# Patient Record
Sex: Female | Born: 1965 | Race: White | Hispanic: No
Health system: Midwestern US, Community
[De-identification: ages and names within clinical notes are randomized; demographics above are authoritative.]

## PROBLEM LIST (undated history)

## (undated) DIAGNOSIS — K529 Noninfective gastroenteritis and colitis, unspecified: Secondary | ICD-10-CM

## (undated) DIAGNOSIS — E559 Vitamin D deficiency, unspecified: Secondary | ICD-10-CM

## (undated) DIAGNOSIS — R87619 Unspecified abnormal cytological findings in specimens from cervix uteri: Secondary | ICD-10-CM

## (undated) DIAGNOSIS — K649 Unspecified hemorrhoids: Secondary | ICD-10-CM

## (undated) DIAGNOSIS — K922 Gastrointestinal hemorrhage, unspecified: Secondary | ICD-10-CM

## (undated) DIAGNOSIS — K921 Melena: Secondary | ICD-10-CM

## (undated) DIAGNOSIS — Z9221 Personal history of antineoplastic chemotherapy: Secondary | ICD-10-CM

## (undated) DIAGNOSIS — M1990 Unspecified osteoarthritis, unspecified site: Secondary | ICD-10-CM

## (undated) DIAGNOSIS — C539 Malignant neoplasm of cervix uteri, unspecified: Secondary | ICD-10-CM

## (undated) DIAGNOSIS — M436 Torticollis: Secondary | ICD-10-CM

## (undated) DIAGNOSIS — K9189 Other postprocedural complications and disorders of digestive system: Secondary | ICD-10-CM

## (undated) DIAGNOSIS — R112 Nausea with vomiting, unspecified: Secondary | ICD-10-CM

## (undated) DIAGNOSIS — D519 Vitamin B12 deficiency anemia, unspecified: Secondary | ICD-10-CM

## (undated) DIAGNOSIS — K5100 Ulcerative (chronic) pancolitis without complications: Secondary | ICD-10-CM

## (undated) DIAGNOSIS — D6851 Activated protein C resistance: Secondary | ICD-10-CM

## (undated) DIAGNOSIS — R0600 Dyspnea, unspecified: Secondary | ICD-10-CM

## (undated) DIAGNOSIS — K739 Chronic hepatitis, unspecified: Secondary | ICD-10-CM

## (undated) DIAGNOSIS — B1920 Unspecified viral hepatitis C without hepatic coma: Secondary | ICD-10-CM

## (undated) DIAGNOSIS — K259 Gastric ulcer, unspecified as acute or chronic, without hemorrhage or perforation: Secondary | ICD-10-CM

## (undated) DIAGNOSIS — R109 Unspecified abdominal pain: Secondary | ICD-10-CM

## (undated) DIAGNOSIS — I700 Atherosclerosis of aorta: Secondary | ICD-10-CM

## (undated) DIAGNOSIS — K625 Hemorrhage of anus and rectum: Secondary | ICD-10-CM

## (undated) DIAGNOSIS — F419 Anxiety disorder, unspecified: Secondary | ICD-10-CM

## (undated) DIAGNOSIS — A0472 Enterocolitis due to Clostridium difficile, not specified as recurrent: Secondary | ICD-10-CM

## (undated) DIAGNOSIS — G893 Neoplasm related pain (acute) (chronic): Secondary | ICD-10-CM

## (undated) DIAGNOSIS — Z8619 Personal history of other infectious and parasitic diseases: Secondary | ICD-10-CM

## (undated) DIAGNOSIS — J189 Pneumonia, unspecified organism: Secondary | ICD-10-CM

## (undated) DIAGNOSIS — R159 Full incontinence of feces: Secondary | ICD-10-CM

## (undated) DIAGNOSIS — C3490 Malignant neoplasm of unspecified part of unspecified bronchus or lung: Secondary | ICD-10-CM

## (undated) DIAGNOSIS — R918 Other nonspecific abnormal finding of lung field: Secondary | ICD-10-CM

## (undated) DIAGNOSIS — I669 Occlusion and stenosis of unspecified cerebral artery: Secondary | ICD-10-CM

## (undated) DIAGNOSIS — R197 Diarrhea, unspecified: Secondary | ICD-10-CM

## (undated) DIAGNOSIS — N280 Ischemia and infarction of kidney: Secondary | ICD-10-CM

## (undated) DIAGNOSIS — K219 Gastro-esophageal reflux disease without esophagitis: Secondary | ICD-10-CM

## (undated) DIAGNOSIS — D518 Other vitamin B12 deficiency anemias: Secondary | ICD-10-CM

## (undated) DIAGNOSIS — K601 Chronic anal fissure: Secondary | ICD-10-CM

## (undated) DIAGNOSIS — R011 Cardiac murmur, unspecified: Secondary | ICD-10-CM

## (undated) DIAGNOSIS — K3189 Other diseases of stomach and duodenum: Secondary | ICD-10-CM

## (undated) DIAGNOSIS — K56609 Unspecified intestinal obstruction, unspecified as to partial versus complete obstruction: Secondary | ICD-10-CM

## (undated) DIAGNOSIS — A630 Anogenital (venereal) warts: Secondary | ICD-10-CM

## (undated) DIAGNOSIS — Z5189 Encounter for other specified aftercare: Secondary | ICD-10-CM

## (undated) HISTORY — DX: Unspecified abnormal cytological findings in specimens from cervix uteri: R87.619

## (undated) HISTORY — DX: Occlusion and stenosis of unspecified cerebral artery: I66.9

## (undated) HISTORY — DX: Diarrhea, unspecified: R19.7

## (undated) HISTORY — DX: Enterocolitis due to Clostridium difficile, not specified as recurrent: A04.72

## (undated) HISTORY — DX: Encounter for other specified aftercare: Z51.89

## (undated) HISTORY — PX: DIAGNOSTIC LAPAROSCOPY: SUR761

## (undated) HISTORY — DX: Gastric ulcer, unspecified as acute or chronic, without hemorrhage or perforation: K25.9

## (undated) HISTORY — DX: Melena: K92.1

## (undated) HISTORY — DX: Unspecified abdominal pain: R10.9

## (undated) HISTORY — PX: SMALL INTESTINE SURGERY: SHX150

## (undated) HISTORY — PX: TANDEM RING INSERTION: SHX6199

## (undated) HISTORY — DX: Ulcerative (chronic) pancolitis without complications: K51.00

## (undated) HISTORY — DX: Other diseases of stomach and duodenum: K31.89

## (undated) HISTORY — DX: Cardiac murmur, unspecified: R01.1

## (undated) HISTORY — PX: THORACOTOMY: SUR1349

## (undated) HISTORY — DX: Ischemia and infarction of kidney: N28.0

## (undated) HISTORY — PX: CHOLECYSTECTOMY: SHX55

## (undated) HISTORY — DX: Nausea with vomiting, unspecified: R11.2

## (undated) HISTORY — DX: Neoplasm related pain (acute) (chronic): G89.3

## (undated) HISTORY — DX: Unspecified osteoarthritis, unspecified site: M19.90

## (undated) HISTORY — DX: Chronic hepatitis, unspecified: K73.9

## (undated) HISTORY — DX: Other postprocedural complications and disorders of digestive system: K91.89

## (undated) HISTORY — DX: Hemorrhage of anus and rectum: K62.5

## (undated) HISTORY — PX: OOPHORECTOMY: SHX86

---
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
## 2013-11-11 ENCOUNTER — Emergency Department: Payer: Self-pay | Admitting: Emergency Medicine

## 2016-05-29 NOTE — Nursing Note (Signed)
Nursing Discharge Summary - Text       Nursing Discharge Summary Entered On:  05/29/2016 16:36 EDT    Performed On:  05/29/2016 16:36 EDT by Leanna BattlesMALONE, RN, JENNIFER L               DC Information   Discharge To, Anticipated :   Home independently   Mode of Discharge :   Ambulatory   Transportation :   Private vehicle   Accompanied By :   Casimer BilisFriend   MALONE, RN, JENNIFER L - 05/29/2016 16:36 EDT   Education   Responsible Learner(s) :   No Data Available     Barriers To Learning :   None evident   Teaching Method :   Explanation, Printed materials   Education Referral Made To :   Physician Specialist   Leanna BattlesMALONE, RN, JENNIFER L - 05/29/2016 16:36 EDT

## 2016-09-13 DIAGNOSIS — N83519 Torsion of ovary and ovarian pedicle, unspecified side: Secondary | ICD-10-CM | POA: Insufficient documentation

## 2016-09-15 DIAGNOSIS — C539 Malignant neoplasm of cervix uteri, unspecified: Secondary | ICD-10-CM

## 2016-09-15 HISTORY — DX: Malignant neoplasm of cervix uteri, unspecified: C53.9

## 2016-09-18 DIAGNOSIS — C539 Malignant neoplasm of cervix uteri, unspecified: Secondary | ICD-10-CM | POA: Insufficient documentation

## 2016-09-25 DIAGNOSIS — C530 Malignant neoplasm of endocervix: Secondary | ICD-10-CM | POA: Insufficient documentation

## 2017-01-29 DIAGNOSIS — T451X5A Adverse effect of antineoplastic and immunosuppressive drugs, initial encounter: Secondary | ICD-10-CM

## 2017-01-29 DIAGNOSIS — D701 Agranulocytosis secondary to cancer chemotherapy: Secondary | ICD-10-CM | POA: Insufficient documentation

## 2017-03-01 DIAGNOSIS — D638 Anemia in other chronic diseases classified elsewhere: Secondary | ICD-10-CM | POA: Insufficient documentation

## 2017-03-15 DIAGNOSIS — I10 Essential (primary) hypertension: Secondary | ICD-10-CM | POA: Insufficient documentation

## 2017-05-05 DIAGNOSIS — K739 Chronic hepatitis, unspecified: Secondary | ICD-10-CM

## 2017-05-05 HISTORY — DX: Chronic hepatitis, unspecified: K73.9

## 2017-05-12 DIAGNOSIS — C539 Malignant neoplasm of cervix uteri, unspecified: Secondary | ICD-10-CM | POA: Insufficient documentation

## 2017-05-12 DIAGNOSIS — C799 Secondary malignant neoplasm of unspecified site: Secondary | ICD-10-CM | POA: Insufficient documentation

## 2017-06-20 DIAGNOSIS — R300 Dysuria: Secondary | ICD-10-CM | POA: Insufficient documentation

## 2017-07-18 DIAGNOSIS — M47812 Spondylosis without myelopathy or radiculopathy, cervical region: Secondary | ICD-10-CM | POA: Insufficient documentation

## 2017-08-16 DIAGNOSIS — K56609 Unspecified intestinal obstruction, unspecified as to partial versus complete obstruction: Secondary | ICD-10-CM

## 2017-08-16 HISTORY — DX: Unspecified intestinal obstruction, unspecified as to partial versus complete obstruction: K56.609

## 2017-08-16 HISTORY — PX: COLON SURGERY: SHX602

## 2017-08-26 ENCOUNTER — Inpatient Hospital Stay
Admission: EM | Admit: 2017-08-26 | Discharge: 2017-09-05 | DRG: 330 | Disposition: A | Discharge status: Home / Self Care | Payer: Medicaid Other | Attending: Surgery | Admitting: Surgery

## 2017-08-26 ENCOUNTER — Encounter: Payer: Self-pay | Admitting: Emergency Medicine

## 2017-08-26 ENCOUNTER — Emergency Department: Payer: Medicaid Other

## 2017-08-26 ENCOUNTER — Other Ambulatory Visit: Payer: Self-pay

## 2017-08-26 DIAGNOSIS — Z978 Presence of other specified devices: Secondary | ICD-10-CM

## 2017-08-26 DIAGNOSIS — Z79899 Other long term (current) drug therapy: Secondary | ICD-10-CM

## 2017-08-26 DIAGNOSIS — E871 Hypo-osmolality and hyponatremia: Secondary | ICD-10-CM | POA: Diagnosis present

## 2017-08-26 DIAGNOSIS — F419 Anxiety disorder, unspecified: Secondary | ICD-10-CM | POA: Diagnosis present

## 2017-08-26 DIAGNOSIS — K567 Ileus, unspecified: Secondary | ICD-10-CM | POA: Diagnosis present

## 2017-08-26 DIAGNOSIS — E860 Dehydration: Secondary | ICD-10-CM | POA: Diagnosis present

## 2017-08-26 DIAGNOSIS — K56609 Unspecified intestinal obstruction, unspecified as to partial versus complete obstruction: Principal | ICD-10-CM

## 2017-08-26 DIAGNOSIS — Z9221 Personal history of antineoplastic chemotherapy: Secondary | ICD-10-CM

## 2017-08-26 DIAGNOSIS — I10 Essential (primary) hypertension: Secondary | ICD-10-CM | POA: Diagnosis present

## 2017-08-26 DIAGNOSIS — Z923 Personal history of irradiation: Secondary | ICD-10-CM

## 2017-08-26 DIAGNOSIS — N736 Female pelvic peritoneal adhesions (postinfective): Secondary | ICD-10-CM | POA: Diagnosis present

## 2017-08-26 DIAGNOSIS — R109 Unspecified abdominal pain: Secondary | ICD-10-CM

## 2017-08-26 DIAGNOSIS — Z8541 Personal history of malignant neoplasm of cervix uteri: Secondary | ICD-10-CM

## 2017-08-26 DIAGNOSIS — R918 Other nonspecific abnormal finding of lung field: Secondary | ICD-10-CM | POA: Diagnosis present

## 2017-08-26 DIAGNOSIS — Z888 Allergy status to other drugs, medicaments and biological substances status: Secondary | ICD-10-CM

## 2017-08-26 HISTORY — DX: Anxiety disorder, unspecified: F41.9

## 2017-08-26 LAB — CBC
HCT: 39.4 % (ref 35.0–47.0)
HEMOGLOBIN: 13.5 g/dL (ref 12.0–16.0)
MCH: 33.2 pg (ref 26.0–34.0)
MCHC: 34.4 g/dL (ref 32.0–36.0)
MCV: 96.5 fL (ref 80.0–100.0)
Platelets: 278 10*3/uL (ref 150–440)
RBC: 4.08 MIL/uL (ref 3.80–5.20)
RDW: 13.6 % (ref 11.5–14.5)
WBC: 7.5 10*3/uL (ref 3.6–11.0)

## 2017-08-26 LAB — COMPREHENSIVE METABOLIC PANEL
ALT: 39 U/L (ref 14–54)
ANION GAP: 12 (ref 5–15)
AST: 64 U/L — AB (ref 15–41)
Albumin: 4 g/dL (ref 3.5–5.0)
Alkaline Phosphatase: 186 U/L — ABNORMAL HIGH (ref 38–126)
BILIRUBIN TOTAL: 0.8 mg/dL (ref 0.3–1.2)
BUN: 25 mg/dL — AB (ref 6–20)
CALCIUM: 9.5 mg/dL (ref 8.9–10.3)
CO2: 27 mmol/L (ref 22–32)
Chloride: 95 mmol/L — ABNORMAL LOW (ref 101–111)
Creatinine, Ser: 1.35 mg/dL — ABNORMAL HIGH (ref 0.44–1.00)
GFR, EST AFRICAN AMERICAN: 52 mL/min — AB (ref >60)
GFR, EST NON AFRICAN AMERICAN: 45 mL/min — AB (ref >60)
GLUCOSE: 107 mg/dL — AB (ref 65–99)
POTASSIUM: 3.6 mmol/L (ref 3.5–5.1)
Sodium: 134 mmol/L — ABNORMAL LOW (ref 135–145)
TOTAL PROTEIN: 8.8 g/dL — AB (ref 6.5–8.1)

## 2017-08-26 LAB — LIPASE, BLOOD: Lipase: 19 U/L (ref 11–51)

## 2017-08-26 MED ORDER — MORPHINE SULFATE (PF) 2 MG/ML IV SOLN
2.0000 mg | Freq: Once | INTRAVENOUS | Status: AC
  Administered 2017-08-26: 2 mg via INTRAVENOUS
  Filled 2017-08-26: qty 1

## 2017-08-26 MED ORDER — MORPHINE SULFATE (PF) 4 MG/ML IV SOLN
4.0000 mg | Freq: Once | INTRAVENOUS | Status: AC
  Administered 2017-08-26: 4 mg via INTRAVENOUS

## 2017-08-26 MED ORDER — PROMETHAZINE HCL 25 MG/ML IJ SOLN
INTRAMUSCULAR | Status: AC
  Administered 2017-08-27: 12.5 mg via INTRAVENOUS
  Filled 2017-08-26: qty 1

## 2017-08-26 MED ORDER — MORPHINE SULFATE (PF) 4 MG/ML IV SOLN
4.0000 mg | Freq: Once | INTRAVENOUS | Status: AC
  Administered 2017-08-26 – 2017-08-27 (×2): 4 mg via INTRAVENOUS
  Filled 2017-08-26: qty 1

## 2017-08-26 MED ORDER — PROMETHAZINE HCL 25 MG/ML IJ SOLN
12.5000 mg | Freq: Once | INTRAMUSCULAR | Status: AC
  Administered 2017-08-26 – 2017-08-27 (×2): 12.5 mg via INTRAVENOUS
  Filled 2017-08-26: qty 1

## 2017-08-26 MED ORDER — MORPHINE SULFATE (PF) 4 MG/ML IV SOLN
INTRAVENOUS | Status: AC
  Administered 2017-08-27: 4 mg via INTRAVENOUS
  Filled 2017-08-26: qty 1

## 2017-08-26 MED ORDER — IOPAMIDOL (ISOVUE-300) INJECTION 61%
75.0000 mL | Freq: Once | INTRAVENOUS | Status: AC | PRN
  Administered 2017-08-26: 75 mL via INTRAVENOUS

## 2017-08-26 MED ORDER — PROMETHAZINE HCL 25 MG/ML IJ SOLN
12.5000 mg | Freq: Once | INTRAMUSCULAR | Status: AC
  Administered 2017-08-26: 12.5 mg via INTRAVENOUS

## 2017-08-26 MED ORDER — ONDANSETRON HCL 4 MG/2ML IJ SOLN
4.0000 mg | Freq: Once | INTRAMUSCULAR | Status: AC | PRN
  Administered 2017-08-26: 4 mg via INTRAVENOUS
  Filled 2017-08-26: qty 2

## 2017-08-26 MED ORDER — SODIUM CHLORIDE 0.9 % IV BOLUS (SEPSIS)
1000.0000 mL | Freq: Once | INTRAVENOUS | Status: AC
  Administered 2017-08-26: 1000 mL via INTRAVENOUS

## 2017-08-26 MED ORDER — IOPAMIDOL (ISOVUE-300) INJECTION 61%
30.0000 mL | Freq: Once | INTRAVENOUS | Status: AC | PRN
Start: 2017-08-26 — End: 2017-08-26
  Administered 2017-08-26: 30 mL via ORAL

## 2017-08-26 NOTE — ED Triage Notes (Signed)
Pt has hx of cancer which she just completed treatments. Pt was at [facility] in [place] x 13 until Wednesday with small bowl obstruction that was linked to her her radiation treatments per her MD. Pt has been vomiting since Friday without relief. Pt is vomiting in triage with visible paleness and complains of dizziness and lightheadedness.

## 2017-08-26 NOTE — ED Provider Notes (Signed)
[facility] Emergency Department Provider Note  ____________________________________________   First MD Initiated Contact with Patient 08/26/17 1941     (approximate)  I have reviewed the triage vital signs and the nursing notes.   HISTORY  Chief Complaint Emesis   HPI Joanna Hall is a 51 y.o. female here for evaluation of vomiting  Patient reports that she has cervical cancer recently, she was treated with chemotherapy and radiation at [facility]  She was recently discharged from the hospital on Wednesday with a 13-day hospital course for a bowel obstruction.  She then traveled here to stay with her sister, yesterday she began experiencing nausea and upper abdominal pain.  Today she began having frequent vomiting and increasing abdominal pain.  She does report that she had one bowel movement this morning that was slightly loose and she is continued to pass some gas but reports that she is having the pain and same symptoms that she had when she had a bowel obstruction that was treated without surgery last week in [place].  No numbness or tingling.  No chest pain or trouble breathing.  She has had her gallbladder previously removed and had both ovaries removed.    Past Medical History:  Diagnosis Date  . Anxiety   . Cancer ([place])   . Hypertension     There are no active problems to display for this patient.   History reviewed. No pertinent surgical history.  Prior to Admission medications   Not on File    Allergies Ketamine  No family history on file.  Social History Social History   Tobacco Use  . Smoking status: Never Smoker  . Smokeless tobacco: Never Used  Substance Use Topics  . Alcohol use: No    Frequency: Never  . Drug use: No    Review of Systems Constitutional: No fever/chills Eyes: No visual changes. ENT: No sore throat. Cardiovascular: Denies chest pain. Respiratory: Denies shortness of breath. Gastrointestinal:  See HPI Genitourinary: Negative for dysuria. Musculoskeletal: Negative for back pain. Skin: Negative for rash. Neurological: Negative for headaches, focal weakness or numbness.    ____________________________________________   PHYSICAL EXAM:  VITAL SIGNS: ED Triage Vitals  Enc Vitals Group     BP 08/26/17 1920 132/83     Pulse Rate 08/26/17 1920 (!) 105     Resp 08/26/17 1920 20     Temp 08/26/17 1920 98 F (36.7 C)     Temp Source 08/26/17 1920 Oral     SpO2 08/26/17 1920 98 %     Weight 08/26/17 1922 157 lb (71.2 kg)     Height 08/26/17 1922 5' 9.5" (1.765 m)     Head Circumference --      Peak Flow --      Pain Score 08/26/17 1920 10     Pain Loc --      Pain Edu? --      Excl. in [place]? --     Constitutional: Alert and oriented. Well appearing and in no acute distress. Eyes: Conjunctivae are normal. Head: Atraumatic. Nose: No congestion/rhinnorhea. Mouth/Throat: Mucous membranes are moist. Neck: No stridor.   Cardiovascular: Normal rate, regular rhythm. Grossly normal heart sounds.  Good peripheral circulation. Respiratory: Normal respiratory effort.  No retractions. Lungs CTAB. Gastrointestinal: Soft and moderate tenderness in the left upper quadrant and epigastric region.  No rebound guarding or peritonitis.  Bowel sounds are present.   Musculoskeletal: No lower extremity tenderness nor edema. Neurologic:  Normal speech and language.  No gross focal neurologic deficits are appreciated.  Skin:  Skin is warm, dry and intact. No rash noted. Psychiatric: Mood and affect are normal. Speech and behavior are normal.  ____________________________________________   LABS (all labs ordered are listed, but only abnormal results are displayed)  Labs Reviewed  COMPREHENSIVE METABOLIC PANEL - Abnormal; Notable for the following components:      Result Value   Sodium 134 (*)    Chloride 95 (*)    Glucose, Bld 107 (*)    BUN 25 (*)    Creatinine, Ser 1.35 (*)    Total  Protein 8.8 (*)    AST 64 (*)    Alkaline Phosphatase 186 (*)    GFR calc non Af Amer 45 (*)    GFR calc Af Amer 52 (*)    All other components within normal limits  LIPASE, BLOOD  CBC  URINALYSIS, COMPLETE (UACMP) WITH MICROSCOPIC  CBG MONITORING, ED   ____________________________________________  EKG   ____________________________________________  RADIOLOGY  Ct Abdomen Pelvis W Contrast  Result Date: 08/26/2017 CLINICAL DATA:  [name]; history of cervical cancer status post chemotherapy and radiation. Recent small bowel obstruction. Presenting with vomiting. EXAM: CT ABDOMEN AND PELVIS WITH CONTRAST TECHNIQUE: Multidetector CT imaging of the abdomen and pelvis was performed using the standard protocol following bolus administration of intravenous contrast. CONTRAST:  64m ISOVUE-300 IOPAMIDOL (ISOVUE-300) INJECTION 61% COMPARISON:  None. FINDINGS: Lower chest: Numerous pulmonary nodules in the lung bases measuring up to 9 mm on the left, likely metastatic disease (series 4, image 12). Hepatobiliary: No focal liver lesion identified. Status post cholecystectomy. Mild intra and extrahepatic biliary ductal dilatation the common bile duct measuring up to 13 mm which may be compensatory post cholecystectomy, clinical correlation recommended. Pancreas: Unremarkable. No pancreatic ductal dilatation or surrounding inflammatory changes. Spleen: Normal in size without focal abnormality. Adrenals/Urinary Tract: Adrenal glands are unremarkable. Punctate right kidney lower pole and left kidney interpolar nephrolithiasis. No hydronephrosis. Normal bladder. Stomach/Bowel: Small bowel obstruction with transition in the pelvis just superior to the uterus where there is a long segment of distal ileum which demonstrates fatty wall thickening compatible chronic inflammation and/or radiation enteritis. Colon is unremarkable. Normal appendix. There is thickening of the mesial rectal fascia which likely  represents sequelae of radiation. Vascular/Lymphatic: Aortic atherosclerosis. No enlarged abdominal or pelvic lymph nodes. Reproductive: Fluid-filled uterus which may reflect stenosis at the level of the cervix or postsurgical changes. No discrete mass identified. Other: Small volume of pelvic fluid, likely physiologic. Musculoskeletal: Mild lumbar spine levocurvature and moderate multilevel disc and facet degenerative changes. No acute osseous abnormality is evident. IMPRESSION: 1. Small bowel obstruction with transition in the pelvis just superior uterus at a long segment of ileum demonstrating chronic inflammatory/ radiation treatment changes. 2. Numerous pulmonary nodules, likely metastatic disease. 3. Enlarged common bile duct post cholecystectomy may be compensatory. No obstructing stone or mass identified. Clinical correlation recommended. 4. Bilateral kidney punctate nephrolithiasis. 5. Aortic atherosclerosis. 6. Fluid filled uterus may reflect cervical stenosis or postsurgical changes. No discrete mass identified. Electronically Signed   By: LKristine GarbeM.D.   On: 08/26/2017 23:17   Dg Abd Portable 1 View  Result Date: 08/26/2017 CLINICAL DATA:  [place] with small bowel obstruction thought to be due to radiation treatment for cancer. Patient has been vomiting over the past 3 days without relief. EXAM: PORTABLE ABDOMEN - 1 VIEW COMPARISON:  None. FINDINGS: Examination demonstrates multiple air-filled  dilated small bowel loops measuring up to 4.2 cm in diameter. There is air seen over the colon. No evidence of free peritoneal air. No mass mass effect. There are degenerative changes of the spine. Phleboliths over the pelvis. IMPRESSION: Findings compatible with a small bowel ileus versus early/partial mid to distal small bowel obstruction. Electronically Signed   By: Marin Olp M.D.   On: 08/26/2017 20:52    CT and x-ray results  reviewed.  CT notable for small bowel obstruction also numerous pulmonary nodules, concerning for metastatic disease ____________________________________________   PROCEDURES  Procedure(s) performed: None  Procedures  Critical Care performed: No  ____________________________________________   INITIAL IMPRESSION / ASSESSMENT AND PLAN / ED COURSE  Pertinent labs & imaging results that were available during my care of the patient were reviewed by me and considered in my medical decision making (see chart for details).  Differential diagnosis includes but is not limited to, abdominal perforation, aortic dissection, cholecystitis, appendicitis, diverticulitis, colitis, esophagitis/gastritis, kidney stone, pyelonephritis, urinary tract infection, aortic aneurysm. All are considered in decision and treatment plan. Based upon the patient's presentation and risk factors, I am concerned about strong suspicion for recurrent bowel obstruction or ileus.  Will proceed with CT scan, antiemetics, pain control.  No associated neurologic cardiac or respiratory symptoms.  Patient alert and oriented.     ----------------------------------------- 11:39 PM on 08/26/2017 -----------------------------------------  Patient reports pain is improving, but still present.  She has had ongoing vomiting in the ER despite antiemetics, likely because she has a bowel obstruction.  At this time, discussed with general surgery who advises placing an NG tube and that they will see the patient for admission.  Patient agreeable with plan for admission, understanding of reasoning and recurrence of small bowel obstruction.  ____________________________________________   FINAL CLINICAL IMPRESSION(S) / ED DIAGNOSES  Final diagnoses:  Small bowel obstruction ([place])  Pulmonary nodules      NEW MEDICATIONS STARTED DURING THIS VISIT:  This SmartLink is deprecated. Use AVSMEDLIST instead to display the medication list  for a patient.   Note:  This document was prepared using Dragon voice recognition software and may include unintentional dictation errors.     Delman Kitten, MD 08/26/17 [phone]

## 2017-08-27 ENCOUNTER — Other Ambulatory Visit: Payer: Self-pay

## 2017-08-27 ENCOUNTER — Inpatient Hospital Stay: Payer: Medicaid Other

## 2017-08-27 ENCOUNTER — Emergency Department: Payer: Medicaid Other

## 2017-08-27 DIAGNOSIS — E860 Dehydration: Secondary | ICD-10-CM | POA: Diagnosis present

## 2017-08-27 DIAGNOSIS — Z923 Personal history of irradiation: Secondary | ICD-10-CM | POA: Diagnosis not present

## 2017-08-27 DIAGNOSIS — Z8541 Personal history of malignant neoplasm of cervix uteri: Secondary | ICD-10-CM | POA: Diagnosis not present

## 2017-08-27 DIAGNOSIS — Z79899 Other long term (current) drug therapy: Secondary | ICD-10-CM | POA: Diagnosis not present

## 2017-08-27 DIAGNOSIS — I10 Essential (primary) hypertension: Secondary | ICD-10-CM | POA: Diagnosis present

## 2017-08-27 DIAGNOSIS — K56609 Unspecified intestinal obstruction, unspecified as to partial versus complete obstruction: Secondary | ICD-10-CM | POA: Diagnosis present

## 2017-08-27 DIAGNOSIS — R918 Other nonspecific abnormal finding of lung field: Secondary | ICD-10-CM | POA: Diagnosis present

## 2017-08-27 DIAGNOSIS — Z9221 Personal history of antineoplastic chemotherapy: Secondary | ICD-10-CM | POA: Diagnosis not present

## 2017-08-27 DIAGNOSIS — Z888 Allergy status to other drugs, medicaments and biological substances status: Secondary | ICD-10-CM | POA: Diagnosis not present

## 2017-08-27 DIAGNOSIS — F419 Anxiety disorder, unspecified: Secondary | ICD-10-CM | POA: Diagnosis present

## 2017-08-27 DIAGNOSIS — N736 Female pelvic peritoneal adhesions (postinfective): Secondary | ICD-10-CM | POA: Diagnosis present

## 2017-08-27 DIAGNOSIS — E871 Hypo-osmolality and hyponatremia: Secondary | ICD-10-CM | POA: Diagnosis present

## 2017-08-27 DIAGNOSIS — K567 Ileus, unspecified: Secondary | ICD-10-CM | POA: Diagnosis present

## 2017-08-27 LAB — COMPREHENSIVE METABOLIC PANEL
ALT: 54 U/L (ref 14–54)
ANION GAP: 10 (ref 5–15)
AST: 96 U/L — ABNORMAL HIGH (ref 15–41)
Albumin: 3.2 g/dL — ABNORMAL LOW (ref 3.5–5.0)
Alkaline Phosphatase: 226 U/L — ABNORMAL HIGH (ref 38–126)
BUN: 20 mg/dL (ref 6–20)
CHLORIDE: 96 mmol/L — AB (ref 101–111)
CO2: 26 mmol/L (ref 22–32)
CREATININE: 0.92 mg/dL (ref 0.44–1.00)
Calcium: 8.5 mg/dL — ABNORMAL LOW (ref 8.9–10.3)
Glucose, Bld: 94 mg/dL (ref 65–99)
POTASSIUM: 3.6 mmol/L (ref 3.5–5.1)
SODIUM: 132 mmol/L — AB (ref 135–145)
Total Bilirubin: 0.7 mg/dL (ref 0.3–1.2)
Total Protein: 7.2 g/dL (ref 6.5–8.1)

## 2017-08-27 LAB — URINALYSIS, COMPLETE (UACMP) WITH MICROSCOPIC
Bacteria, UA: NONE SEEN
Bilirubin Urine: NEGATIVE
GLUCOSE, UA: NEGATIVE mg/dL
Hgb urine dipstick: NEGATIVE
KETONES UR: NEGATIVE mg/dL
Nitrite: NEGATIVE
PH: 7 (ref 5.0–8.0)
Protein, ur: NEGATIVE mg/dL
Specific Gravity, Urine: >1.046 — ABNORMAL HIGH (ref 1.005–1.030)

## 2017-08-27 LAB — CBC WITH DIFFERENTIAL/PLATELET
BASOS ABS: 0 10*3/uL (ref 0–0.1)
BASOS PCT: 0 %
EOS ABS: 0.1 10*3/uL (ref 0–0.7)
Eosinophils Relative: 3 %
HCT: 31.7 % — ABNORMAL LOW (ref 35.0–47.0)
HEMOGLOBIN: 10.9 g/dL — AB (ref 12.0–16.0)
Lymphocytes Relative: 26 %
Lymphs Abs: 1.3 10*3/uL (ref 1.0–3.6)
MCH: 33.2 pg (ref 26.0–34.0)
MCHC: 34.4 g/dL (ref 32.0–36.0)
MCV: 96.7 fL (ref 80.0–100.0)
MONOS PCT: 8 %
Monocytes Absolute: 0.4 10*3/uL (ref 0.2–0.9)
NEUTROS PCT: 63 %
Neutro Abs: 3 10*3/uL (ref 1.4–6.5)
Platelets: 221 10*3/uL (ref 150–440)
RBC: 3.28 MIL/uL — ABNORMAL LOW (ref 3.80–5.20)
RDW: 13.8 % (ref 11.5–14.5)
WBC: 4.8 10*3/uL (ref 3.6–11.0)

## 2017-08-27 LAB — PROTIME-INR
INR: 1.02
PROTHROMBIN TIME: 13.3 s (ref 11.4–15.2)

## 2017-08-27 LAB — APTT: APTT: 33 s (ref 24–36)

## 2017-08-27 MED ORDER — PROMETHAZINE HCL 25 MG/ML IJ SOLN
12.5000 mg | Freq: Four times a day (QID) | INTRAMUSCULAR | Status: DC | PRN
  Administered 2017-08-27 – 2017-09-04 (×10): 12.5 mg via INTRAVENOUS
  Filled 2017-08-27 (×11): qty 1

## 2017-08-27 MED ORDER — GABAPENTIN 300 MG PO CAPS
300.0000 mg | ORAL_CAPSULE | Freq: Three times a day (TID) | ORAL | Status: DC
  Administered 2017-08-27 – 2017-09-04 (×24): 300 mg via ORAL
  Filled 2017-08-27 (×26): qty 1

## 2017-08-27 MED ORDER — DIATRIZOATE MEGLUMINE & SODIUM 66-10 % PO SOLN: 90.0000 mL | Freq: Once | ORAL | Status: DC

## 2017-08-27 MED ORDER — HYDRALAZINE HCL 20 MG/ML IJ SOLN: 10.0000 mg | Freq: Four times a day (QID) | INTRAMUSCULAR | Status: DC | PRN

## 2017-08-27 MED ORDER — PANTOPRAZOLE SODIUM 40 MG IV SOLR
40.0000 mg | Freq: Every day | INTRAVENOUS | Status: DC
  Administered 2017-08-27 – 2017-09-04 (×8): 40 mg via INTRAVENOUS
  Filled 2017-08-27 (×9): qty 40

## 2017-08-27 MED ORDER — HYDROMORPHONE HCL 1 MG/ML IJ SOLN
0.5000 mg | INTRAMUSCULAR | Status: DC | PRN
  Administered 2017-08-27 (×2): 0.5 mg via INTRAVENOUS
  Filled 2017-08-27 (×2): qty 0.5

## 2017-08-27 MED ORDER — ENOXAPARIN SODIUM 40 MG/0.4ML ~~LOC~~ SOLN
40.0000 mg | SUBCUTANEOUS | Status: DC
  Administered 2017-08-27 – 2017-09-04 (×10): 40 mg via SUBCUTANEOUS
  Filled 2017-08-27 (×10): qty 0.4

## 2017-08-27 MED ORDER — KETOROLAC TROMETHAMINE 30 MG/ML IJ SOLN
30.0000 mg | Freq: Four times a day (QID) | INTRAMUSCULAR | Status: DC | PRN
  Administered 2017-08-27 – 2017-09-01 (×11): 30 mg via INTRAVENOUS
  Filled 2017-08-27 (×11): qty 1

## 2017-08-27 MED ORDER — ONDANSETRON HCL 4 MG/2ML IJ SOLN
4.0000 mg | Freq: Four times a day (QID) | INTRAMUSCULAR | Status: DC | PRN
  Administered 2017-08-27 – 2017-09-04 (×7): 4 mg via INTRAVENOUS
  Filled 2017-08-27 (×7): qty 2

## 2017-08-27 MED ORDER — MORPHINE SULFATE (PF) 2 MG/ML IV SOLN
2.0000 mg | INTRAVENOUS | Status: DC | PRN
  Administered 2017-08-27 – 2017-09-01 (×17): 2 mg via INTRAVENOUS
  Filled 2017-08-27 (×18): qty 1

## 2017-08-27 MED ORDER — SODIUM CHLORIDE 0.9 % IV SOLN
INTRAVENOUS | Status: DC
  Administered 2017-08-27 – 2017-08-28 (×4): via INTRAVENOUS

## 2017-08-27 MED ORDER — PROMETHAZINE HCL 25 MG/ML IJ SOLN: 12.5000 mg | Freq: Four times a day (QID) | INTRAMUSCULAR | Status: DC | PRN

## 2017-08-27 MED ORDER — ONDANSETRON HCL 4 MG/2ML IJ SOLN: 4.0000 mg | Freq: Once | INTRAMUSCULAR | Status: DC | PRN

## 2017-08-27 NOTE — Consult Note (Signed)
SURGICAL CONSULTATION NOTE   HISTORY OF PRESENT ILLNESS (HPI):  51 y.o. female presented to [facility] ED for evaluation of nausea and vomiting. Patient reports that she started with nausea and vomiting since 2 days ago. Also refers having abdominal pain lower abdomen. The patient refers that she was recently admitted for two week at a hospital on [place] with the diagnosis of small bowel obstruction. Patient was treated conservatively and was discharged 4 days ago. Upon discharge patient was eating regular diet and having bowel movements.   Last bowel movement as per patient was this morning and refers has been passing flatus today. Denies fever or chills.   Patient has an history of cervical cancer which refers completed chemo and radio therapy on August.  Patient was told that she was successfully treated for the cervical cancer and her follow up with Gynecologist is no January 2019. Patient also has a past surgical history of Laparoscopic cholecystectomy when she was 51 years old. Cannot recall the surgeon.    PAST MEDICAL HISTORY (PMH):  Past Medical History:  Diagnosis Date  . Anxiety   . Cancer ([place])   . Hypertension      PAST SURGICAL HISTORY ([place]):  History reviewed. No pertinent surgical history.   MEDICATIONS:  Prior to Admission medications   Medication Sig Start Date End Date Taking? Authorizing Provider  acetaminophen (TYLENOL) 325 MG tablet Take 650 mg every 6 (six) hours as needed by mouth.   Yes [provider]  amLODipine (NORVASC) 10 MG tablet Take 10 mg daily by mouth.   Yes [provider]  gabapentin (NEURONTIN) 300 MG capsule Take 300 mg 3 (three) times daily by mouth.   Yes [provider]  ibuprofen (ADVIL,MOTRIN) 600 MG tablet Take 600 mg every 6 (six) hours by mouth.   Yes [provider]  Multiple Vitamin (MULTIVITAMIN WITH MINERALS) TABS tablet Take 1 tablet daily by mouth.   Yes [provider]  oxyCODONE (OXY  IR/ROXICODONE) 5 MG immediate release tablet Take 5-10 mg every 6 (six) hours as needed by mouth.   Yes [provider]  pantoprazole (PROTONIX) 20 MG tablet Take 20 mg daily by mouth.   Yes [provider]  promethazine (PHENERGAN) 25 MG suppository Place 25 mg every 6 (six) hours as needed rectally for nausea or vomiting.   Yes [provider]  sertraline (ZOLOFT) 50 MG tablet Take 50 mg daily by mouth.   Yes [provider]     ALLERGIES:  Allergies  Allergen Reactions  . Ketamine Other (See Comments)    Unknown but given during previous cancer treatment and untolerated.      SOCIAL HISTORY:  Social History   Socioeconomic History  . Marital status: Legally Separated    Spouse name: Not on file  . Number of children: Not on file  . Years of education: Not on file  . Highest education level: Not on file  Social Needs  . Financial resource strain: Not on file  . Food insecurity - worry: Not on file  . Food insecurity - inability: Not on file  . Transportation needs - medical: Not on file  . Transportation needs - non-medical: Not on file  Occupational History  . Not on file  Tobacco Use  . Smoking status: Never Smoker  . Smokeless tobacco: Never Used  Substance and Sexual Activity  . Alcohol use: No    Frequency: Never  . Drug use: No  . Sexual activity: Not  on file  Other Topics Concern  . Not on file  Social History Narrative  . Not on file     FAMILY HISTORY:  No family history on file.   REVIEW OF SYSTEMS:  Constitutional: denies weight loss, fever, chills, or sweats  Eyes: denies any other vision changes, history of eye injury  ENT: denies sore throat, hearing problems  Respiratory: denies shortness of breath, wheezing  Cardiovascular: denies chest pain, palpitations  Gastrointestinal: See HPI for pertinent positive and negatives.  Genitourinary: denies burning with urination or urinary frequency Musculoskeletal: denies  any other joint pains or cramps  Skin: denies any other rashes or skin discolorations  Neurological: denies any other headache, dizziness, weakness  Psychiatric: denies any other depression, anxiety   All other review of systems were negative   VITAL SIGNS:  Temp:  [98 F (36.7 C)] 98 F (36.7 C) (11/11 1920) Pulse Rate:  [79-105] 94 (11/11 2330) Resp:  [16-20] 17 (11/11 2330) BP: (115-138)/(76-94) 126/88 (11/11 2330) SpO2:  [96 %-100 %] 97 % (11/11 2330) Weight:  [71.2 kg (157 lb)] 71.2 kg (157 lb) (11/11 1922)     Height: 5' 9.5" (176.5 cm) Weight: 71.2 kg (157 lb) BMI (Calculated): 22.86    PHYSICAL EXAM:  Constitutional:  -- Normal body habitus  -- Awake, alert, and oriented x3  Eyes:  -- Pupils equally round and reactive to light  -- No scleral icterus  Ear, nose, and throat:  -- No jugular venous distension  Pulmonary:  -- No crackles  -- Equal breath sounds bilaterally -- Breathing non-labored at rest Cardiovascular:  -- S1, S2 present, no murmurs  -- No pericardial rubs Gastrointestinal:  -- Abdomen soft, depressible, tender to palpation on suprapubic area, distended, no guarding or rebound tenderness, Tympanic, bowel sounds on left lower quadran -- No abdominal masses appreciated, pulsatile or otherwise  Musculoskeletal and Integumentary:  -- Wounds or skin discoloration: None appreciated -- Extremities: no edema, no cyanosis  Neurologic:  -- Motor function: intact and symmetric   Labs:  CBC Latest Ref Rng & Units 08/26/2017  WBC 3.6 - 11.0 K/uL 7.5  Hemoglobin 12.0 - 16.0 g/dL 13.5  Hematocrit 35.0 - 47.0 % 39.4  Platelets 150 - 440 K/uL 278   CMP Latest Ref Rng & Units 08/26/2017  Glucose 65 - 99 mg/dL 107(H)  BUN 6 - 20 mg/dL 25(H)  Creatinine 0.44 - 1.00 mg/dL 1.35(H)  Sodium 135 - 145 mmol/L 134(L)  Potassium 3.5 - 5.1 mmol/L 3.6  Chloride 101 - 111 mmol/L 95(L)  CO2 22 - 32 mmol/L 27  Calcium 8.9 - 10.3 mg/dL 9.5  Total Protein 6.5 - 8.1 g/dL  8.8(H)  Total Bilirubin 0.3 - 1.2 mg/dL 0.8  Alkaline Phos 38 - 126 U/L 186(H)  AST 15 - 41 U/L 64(H)  ALT 14 - 54 U/L 39    Imaging studies:  CT-scan of the abdomen and pelvis reviewed showing dilated small bowel with no oral contrast reaching the colon. There is few amount of air on the large intestine. There is a collapsed small bowel on the right side of the pelvis. No free air or fluid. No masses seen. Multiple small lung nodules seen.   Assessment/Plan: (ICD-10's: K56.60, E86.0) 51 y.o. female with recurrent small bowel obstruction, complicated by pertinent comorbidities including cervical cancer, history or radiotherapy, and hypertension. Patient currently without acute abdomen, no leucocytosis, no acidosis. Patient do present with hyponatremia, hypochloridia, and elevated creatinine due to dehydration. Will start patient with  adequate hydration, bowel rest, serial physical exams, urine output and new laps after hydration. Will follow how she respond to conservative management after adequate hydration and if there is no improvement or patient deteriorates, may need surgical management.    - Admit to ward  - IV Hydration  - Bowel Rest, NGT  -  Foley Catheter  -  New labs  - DVT prophylaxis  All of the above findings and recommendations were discussed with the patient and her sister, and all of patient's and her family's questions were answered to their expressed satisfaction.

## 2017-08-27 NOTE — H&P (Signed)
Surgical History and Physical Exam  HISTORY OF PRESENT ILLNESS (HPI):  51 y.o. female presented to [facility] ED for evaluation of nausea and vomiting. Patient reports that she started with nausea and vomiting since 2 days ago. Also refers having abdominal pain lower abdomen. The patient refers that she was recently admitted for two week at a hospital on [place] with the diagnosis of small bowel obstruction. Patient was treated conservatively and was discharged 4 days ago. Upon discharge patient was eating regular diet and having bowel movements.   Last bowel movement as per patient was this morning and refers has been passing flatus today. Denies fever or chills.   Patient has an history of cervical cancer which refers completed chemo and radio therapy on August.  Patient was told that she was successfully treated for the cervical cancer and her follow up with Gynecologist is no January 2019. Patient also has a past surgical history of Laparoscopic cholecystectomy when she was 51 years old. Cannot recall the surgeon.    PAST MEDICAL HISTORY (PMH):  Past Medical History:  Diagnosis Date  . Anxiety   . Cancer ([place])   . Hypertension      PAST SURGICAL HISTORY ([place]):  History reviewed. No pertinent surgical history.   MEDICATIONS:  Prior to Admission medications   Medication Sig Start Date End Date Taking? Authorizing Provider  acetaminophen (TYLENOL) 325 MG tablet Take 650 mg every 6 (six) hours as needed by mouth.   Yes [provider]  amLODipine (NORVASC) 10 MG tablet Take 10 mg daily by mouth.   Yes [provider]  gabapentin (NEURONTIN) 300 MG capsule Take 300 mg 3 (three) times daily by mouth.   Yes [provider]  ibuprofen (ADVIL,MOTRIN) 600 MG tablet Take 600 mg every 6 (six) hours by mouth.   Yes [provider]  Multiple Vitamin (MULTIVITAMIN WITH MINERALS) TABS tablet Take 1 tablet daily by mouth.   Yes [provider]  oxyCODONE  (OXY IR/ROXICODONE) 5 MG immediate release tablet Take 5-10 mg every 6 (six) hours as needed by mouth.   Yes [provider]  pantoprazole (PROTONIX) 20 MG tablet Take 20 mg daily by mouth.   Yes [provider]  promethazine (PHENERGAN) 25 MG suppository Place 25 mg every 6 (six) hours as needed rectally for nausea or vomiting.   Yes [provider]  sertraline (ZOLOFT) 50 MG tablet Take 50 mg daily by mouth.   Yes [provider]     ALLERGIES:  Allergies  Allergen Reactions  . Ketamine Other (See Comments)    Unknown but given during previous cancer treatment and untolerated.      SOCIAL HISTORY:  Social History   Socioeconomic History  . Marital status: Legally Separated    Spouse name: Not on file  . Number of children: Not on file  . Years of education: Not on file  . Highest education level: Not on file  Social Needs  . Financial resource strain: Not on file  . Food insecurity - worry: Not on file  . Food insecurity - inability: Not on file  . Transportation needs - medical: Not on file  . Transportation needs - non-medical: Not on file  Occupational History  . Not on file  Tobacco Use  . Smoking status: Never Smoker  . Smokeless tobacco: Never Used  Substance and Sexual Activity  . Alcohol use: No    Frequency: Never  . Drug use: No  . Sexual activity:  Not on file  Other Topics Concern  . Not on file  Social History Narrative  . Not on file     FAMILY HISTORY:  No family history on file.   REVIEW OF SYSTEMS:  Constitutional: denies weight loss, fever, chills, or sweats  Eyes: denies any other vision changes, history of eye injury  ENT: denies sore throat, hearing problems  Respiratory: denies shortness of breath, wheezing  Cardiovascular: denies chest pain, palpitations  Gastrointestinal: See HPI for pertinent positive and negatives.  Genitourinary: denies burning with urination or urinary frequency Musculoskeletal:  denies any other joint pains or cramps  Skin: denies any other rashes or skin discolorations  Neurological: denies any other headache, dizziness, weakness  Psychiatric: denies any other depression, anxiety   All other review of systems were negative   VITAL SIGNS:  Temp:  [98 F (36.7 C)] 98 F (36.7 C) (11/11 1920) Pulse Rate:  [79-105] 94 (11/11 2330) Resp:  [16-20] 17 (11/11 2330) BP: (115-138)/(76-94) 126/88 (11/11 2330) SpO2:  [96 %-100 %] 97 % (11/11 2330) Weight:  [71.2 kg (157 lb)] 71.2 kg (157 lb) (11/11 1922)     Height: 5' 9.5" (176.5 cm) Weight: 71.2 kg (157 lb) BMI (Calculated): 22.86    PHYSICAL EXAM:  Constitutional:  -- Normal body habitus  -- Awake, alert, and oriented x3  Eyes:  -- Pupils equally round and reactive to light  -- No scleral icterus  Ear, nose, and throat:  -- No jugular venous distension  Pulmonary:  -- No crackles  -- Equal breath sounds bilaterally -- Breathing non-labored at rest Cardiovascular:  -- S1, S2 present, no murmurs  -- No pericardial rubs Gastrointestinal:  -- Abdomen soft, depressible, tender to palpation on suprapubic area, distended, no guarding or rebound tenderness, Tympanic, bowel sounds on left lower quadran -- No abdominal masses appreciated, pulsatile or otherwise  Musculoskeletal and Integumentary:  -- Wounds or skin discoloration: None appreciated -- Extremities: no edema, no cyanosis  Neurologic:  -- Motor function: intact and symmetric   Labs:  CBC Latest Ref Rng & Units 08/26/2017  WBC 3.6 - 11.0 K/uL 7.5  Hemoglobin 12.0 - 16.0 g/dL 13.5  Hematocrit 35.0 - 47.0 % 39.4  Platelets 150 - 440 K/uL 278   CMP Latest Ref Rng & Units 08/26/2017  Glucose 65 - 99 mg/dL 107(H)  BUN 6 - 20 mg/dL 25(H)  Creatinine 0.44 - 1.00 mg/dL 1.35(H)  Sodium 135 - 145 mmol/L 134(L)  Potassium 3.5 - 5.1 mmol/L 3.6  Chloride 101 - 111 mmol/L 95(L)  CO2 22 - 32 mmol/L 27  Calcium 8.9 - 10.3 mg/dL 9.5  Total Protein 6.5 -  8.1 g/dL 8.8(H)  Total Bilirubin 0.3 - 1.2 mg/dL 0.8  Alkaline Phos 38 - 126 U/L 186(H)  AST 15 - 41 U/L 64(H)  ALT 14 - 54 U/L 39    Imaging studies:  CT-scan of the abdomen and pelvis reviewed showing dilated small bowel with no oral contrast reaching the colon. There is few amount of air on the large intestine. There is a collapsed small bowel on the right side of the pelvis. No free air or fluid. No masses seen. Multiple small lung nodules seen.   Assessment/Plan: (ICD-10's: K56.60, E86.0) 51 y.o. female with recurrent small bowel obstruction, complicated by pertinent comorbidities including cervical cancer, history or radiotherapy, and hypertension. Patient currently without acute abdomen, no leucocytosis, no acidosis. Patient do present with hyponatremia, hypochloridia, and elevated creatinine due to dehydration. Will start patient  with adequate hydration, bowel rest, serial physical exams, urine output and new laps after hydration. Will follow how she respond to conservative management after adequate hydration and if there is no improvement or patient deteriorates, may need surgical management.    - Admit to ward  - IV Hydration  - Bowel Rest, NGT  -  Foley Catheter  -  New labs  - DVT prophylaxis  All of the above findings and recommendations were discussed with the patient and her sister, and all of patient's and her family's questions were answered to their expressed satisfaction.

## 2017-08-27 NOTE — Progress Notes (Signed)
08/27/2017  Subjective: No acute events.  Patient admitted overnight with small bowel obstruction.  Just had a recent episode and admission in [place] for the same.  Reports having abdominal pain and nausea/vomiting prior to admission.  NG tube placed with 1L+ output immediately.    Vital signs: Temp:  [98 F (36.7 C)-98.9 F (37.2 C)] 98 F (36.7 C) (11/12 0424) Pulse Rate:  [79-105] 83 (11/12 0424) Resp:  [16-20] 20 (11/12 0424) BP: (110-138)/(62-94) 113/71 (11/12 0424) SpO2:  [96 %-100 %] 97 % (11/12 0424) Weight:  [70.9 kg (156 lb 4.8 oz)-71.2 kg (157 lb)] 70.9 kg (156 lb 4.8 oz) (11/12 0208)   Intake/Output: 11/11 0701 - 11/12 0700 In: 1000 [IV Piggyback:1000] Out: 0  Last BM Date: 08/26/17  Physical Exam: Constitutional: No acute distress Abdomen:  Soft, nondistended, mild tenderness to palpation in low abdomen.  NG tube in place with thick contents.   Labs:  Recent Labs    08/26/17 1931 08/27/17 0518  WBC 7.5 4.8  HGB 13.5 10.9*  HCT 39.4 31.7*  PLT 278 221   Recent Labs    08/26/17 1931 08/27/17 0518  NA 134* 132*  K 3.6 3.6  CL 95* 96*  CO2 27 26  GLUCOSE 107* 94  BUN 25* 20  CREATININE 1.35* 0.92  CALCIUM 9.5 8.5*   Recent Labs    08/27/17 0518  LABPROT 13.3  INR 1.02    Imaging: Ct Abdomen Pelvis W Contrast  Result Date: 08/26/2017 CLINICAL DATA:  [name]; history of cervical cancer status post chemotherapy and radiation. Recent small bowel obstruction. Presenting with vomiting. EXAM: CT ABDOMEN AND PELVIS WITH CONTRAST TECHNIQUE: Multidetector CT imaging of the abdomen and pelvis was performed using the standard protocol following bolus administration of intravenous contrast. CONTRAST:  74m ISOVUE-300 IOPAMIDOL (ISOVUE-300) INJECTION 61% COMPARISON:  None. FINDINGS: Lower chest: Numerous pulmonary nodules in the lung bases measuring up to 9 mm on the left, likely metastatic disease (series 4, image 12). Hepatobiliary: No focal liver  lesion identified. Status post cholecystectomy. Mild intra and extrahepatic biliary ductal dilatation the common bile duct measuring up to 13 mm which may be compensatory post cholecystectomy, clinical correlation recommended. Pancreas: Unremarkable. No pancreatic ductal dilatation or surrounding inflammatory changes. Spleen: Normal in size without focal abnormality. Adrenals/Urinary Tract: Adrenal glands are unremarkable. Punctate right kidney lower pole and left kidney interpolar nephrolithiasis. No hydronephrosis. Normal bladder. Stomach/Bowel: Small bowel obstruction with transition in the pelvis just superior to the uterus where there is a long segment of distal ileum which demonstrates fatty wall thickening compatible chronic inflammation and/or radiation enteritis. Colon is unremarkable. Normal appendix. There is thickening of the mesial rectal fascia which likely represents sequelae of radiation. Vascular/Lymphatic: Aortic atherosclerosis. No enlarged abdominal or pelvic lymph nodes. Reproductive: Fluid-filled uterus which may reflect stenosis at the level of the cervix or postsurgical changes. No discrete mass identified. Other: Small volume of pelvic fluid, likely physiologic. Musculoskeletal: Mild lumbar spine levocurvature and moderate multilevel disc and facet degenerative changes. No acute osseous abnormality is evident. IMPRESSION: 1. Small bowel obstruction with transition in the pelvis just superior uterus at a long segment of ileum demonstrating chronic inflammatory/ radiation treatment changes. 2. Numerous pulmonary nodules, likely metastatic disease. 3. Enlarged common bile duct post cholecystectomy may be compensatory. No obstructing stone or mass identified. Clinical correlation recommended. 4. Bilateral kidney punctate nephrolithiasis. 5. Aortic atherosclerosis. 6. Fluid filled uterus may reflect cervical stenosis or postsurgical changes. No discrete mass identified. Electronically  Signed    By: Kristine Garbe M.D.   On: 08/26/2017 23:17   Dg Abd 2 Views  Result Date: 08/27/2017 CLINICAL DATA:  Small bowel obstruction EXAM: ABDOMEN - 2 VIEW COMPARISON:  Abdominal radiograph from earlier today FINDINGS: Enteric tube terminates in the distal stomach. Surgical clips overlie the right upper quadrant. Dilated small bowel loops with air-fluid levels throughout the abdomen measuring up to 5.0 cm diameter, minimally improved. No evidence of pneumatosis or pneumoperitoneum. Mild colonic stool. Excreted contrast is noted in the bladder. Clear lung bases. Marked lumbar spondylosis. IMPRESSION: Minimal improvement in distal small bowel obstruction. Enteric tube terminates in the distal stomach. Electronically Signed   By: Ilona Sorrel M.D.   On: 08/27/2017 08:04   Dg Abd Portable 1 View  Result Date: 08/27/2017 CLINICAL DATA:  51 year old female with small bowel obstruction status post NG tube placement. EXAM: PORTABLE ABDOMEN - 1 VIEW COMPARISON:  Abdominal CT dated 08/26/2017 FINDINGS: An enteric tube is noted with tip and side-port in the left upper abdomen likely in the proximal stomach. Multiple air-filled dilated small bowel loops again noted measuring up to 5.5 cm in diameter. There is right upper quadrant cholecystectomy clips. Excreted contrast from recent CT noted within the urinary bladder as well as within the ureters. There is degenerative changes of the spine. IMPRESSION: Enteric tube likely in the proximal stomach. Persistent dilatation of small-bowel loops. Electronically Signed   By: Anner Crete M.D.   On: 08/27/2017 00:51   Dg Abd Portable 1 View  Result Date: 08/26/2017 CLINICAL DATA:  Patient was recently inpatient in [place] [facility] 5 days ago with small bowel obstruction thought to be due to radiation treatment for cancer. Patient has been vomiting over the past 3 days without relief. EXAM: PORTABLE ABDOMEN - 1 VIEW COMPARISON:  None. FINDINGS: Examination  demonstrates multiple air-filled dilated small bowel loops measuring up to 4.2 cm in diameter. There is air seen over the colon. No evidence of free peritoneal air. No mass mass effect. There are degenerative changes of the spine. Phleboliths over the pelvis. IMPRESSION: Findings compatible with a small bowel ileus versus early/partial mid to distal small bowel obstruction. Electronically Signed   By: Marin Olp M.D.   On: 08/26/2017 20:52    Assessment/Plan: 51 yo female with recurrent SBO  --discussed with patient that we would attempt conservative management again as she had successfully been treated this way recently. --If there is no improvement over the next 48 hrs, then may need to consider surgery and exploration.  Likely there would be scarring from her radiation and a segment of small bowel that would need to be resected.  Discussed with patient that her surgery may be more complicated but for now would start with conservative measures.  Patient is in agreement. --continue NPO with IV fluid hydration and NG tube to suction.   Melvyn Neth, [place]

## 2017-08-28 LAB — BASIC METABOLIC PANEL
Anion gap: 8 (ref 5–15)
BUN: 20 mg/dL (ref 6–20)
CALCIUM: 8.2 mg/dL — AB (ref 8.9–10.3)
CHLORIDE: 103 mmol/L (ref 101–111)
CO2: 25 mmol/L (ref 22–32)
CREATININE: 0.86 mg/dL (ref 0.44–1.00)
GFR calc Af Amer: >60 mL/min (ref >60)
GFR calc non Af Amer: >60 mL/min (ref >60)
GLUCOSE: 60 mg/dL — AB (ref 65–99)
Potassium: 3.4 mmol/L — ABNORMAL LOW (ref 3.5–5.1)
Sodium: 136 mmol/L (ref 135–145)

## 2017-08-28 LAB — HIV ANTIBODY (ROUTINE TESTING W REFLEX): HIV Screen 4th Generation wRfx: NONREACTIVE

## 2017-08-28 LAB — MAGNESIUM: Magnesium: 1.4 mg/dL — ABNORMAL LOW (ref 1.7–2.4)

## 2017-08-28 MED ORDER — PHENOL 1.4 % MT LIQD
1.0000 | OROMUCOSAL | Status: DC | PRN
  Administered 2017-08-28: 1 via OROMUCOSAL
  Filled 2017-08-28: qty 177

## 2017-08-28 MED ORDER — MAGNESIUM SULFATE 2 GM/50ML IV SOLN
2.0000 g | Freq: Once | INTRAVENOUS | Status: AC
  Administered 2017-08-28: 2 g via INTRAVENOUS
  Filled 2017-08-28: qty 50

## 2017-08-28 MED ORDER — KCL IN DEXTROSE-NACL 40-5-0.45 MEQ/L-%-% IV SOLN
INTRAVENOUS | Status: DC
  Administered 2017-08-28 – 2017-08-31 (×8): via INTRAVENOUS
  Filled 2017-08-28 (×11): qty 1000

## 2017-08-28 NOTE — Progress Notes (Signed)
08/28/2017  Subjective: Patient has some issues with the NG tube not working well yesterday but after flushing it would work fine.  Reports today that she still have some abdominal discomfort in the lower abdomen but denies any nausea.  She did have a bout of diarrhea last night but has not had any flatus.  Vital signs: Temp:  [97.5 F (36.4 C)-98.4 F (36.9 C)] 98.4 F (36.9 C) (11/13 1348) Pulse Rate:  [69-84] 78 (11/13 1348) Resp:  [16-18] 16 (11/13 1348) BP: (116-123)/(55-72) 116/55 (11/13 1348) SpO2:  [97 %-100 %] 100 % (11/13 1348)   Intake/Output: 11/12 0701 - 11/13 0700 In: 1932 [I.V.:1692; NG/GT:240] Out: 1302 [Urine:1; Emesis/NG output:1300; Stool:1] Last BM Date: 08/27/17(per pt)  Physical Exam: Constitutional: No acute distress Abdomen: Soft, minimally distended, with mild tenderness in the lower abdomen.  NG tube in place and appears to be working.  Labs:  Recent Labs    08/26/17 1931 08/27/17 0518  WBC 7.5 4.8  HGB 13.5 10.9*  HCT 39.4 31.7*  PLT 278 221   Recent Labs    08/27/17 0518 08/28/17 0421  NA 132* 136  K 3.6 3.4*  CL 96* 103  CO2 26 25  GLUCOSE 94 60*  BUN 20 20  CREATININE 0.92 0.86  CALCIUM 8.5* 8.2*   Recent Labs    08/27/17 0518  LABPROT 13.3  INR 1.02    Imaging: No results found.  Assessment/Plan: 51 year old female with small bowel obstruction.  -Discussed with the patient that currently the NG tube is working that we would continue for another 24-48 hours of conservative measures prior to deciding on whether she does need to go to the operating room or not.  Will order another x-ray for tomorrow.  For now continue n.p.o. with IV fluid hydration and NG tube to suction. -We will correct low potassium today with her IV fluids   Melvyn Neth, [place]

## 2017-08-28 NOTE — Progress Notes (Signed)
Patient stated that she is..." extremely upset; I've been waiting a while now for a new gown...no one has taken my blood pressure since I've been here, until a little while ago..my NGT has not been working all day, it's been leaking all over me and I have had to change my gown too many times today;  I've told them that it wasn't working, but they aren't listening..."; validated patient's feelings and agreed with her that the NGT wasn't working; Encourage her to keep the "airport"/blue port above her stomach; air injected into air port and evacuated bile; replaced non-functioning air port with new one, tube pinned to gown, NGT flushed and now is working properly; Apologized for the non-working NGT and her frustration; reassured patient that NGT was now working properly and that I would keep monitoring it throughout the night; voiced understanding and agreement. Barbaraann Faster, RN

## 2017-08-29 ENCOUNTER — Inpatient Hospital Stay: Payer: Medicaid Other

## 2017-08-29 LAB — BASIC METABOLIC PANEL
Anion gap: 6 (ref 5–15)
BUN: 12 mg/dL (ref 6–20)
CALCIUM: 8.4 mg/dL — AB (ref 8.9–10.3)
CO2: 23 mmol/L (ref 22–32)
CREATININE: 0.86 mg/dL (ref 0.44–1.00)
Chloride: 106 mmol/L (ref 101–111)
GFR calc Af Amer: >60 mL/min (ref >60)
GFR calc non Af Amer: >60 mL/min (ref >60)
GLUCOSE: 95 mg/dL (ref 65–99)
Potassium: 4.4 mmol/L (ref 3.5–5.1)
Sodium: 135 mmol/L (ref 135–145)

## 2017-08-29 LAB — MAGNESIUM: Magnesium: 1.6 mg/dL — ABNORMAL LOW (ref 1.7–2.4)

## 2017-08-29 MED ORDER — MAGNESIUM SULFATE 2 GM/50ML IV SOLN
2.0000 g | Freq: Once | INTRAVENOUS | Status: AC
  Administered 2017-08-29: 2 g via INTRAVENOUS
  Filled 2017-08-29: qty 50

## 2017-08-29 MED ORDER — DIATRIZOATE MEGLUMINE & SODIUM 66-10 % PO SOLN
90.0000 mL | Freq: Once | ORAL | Status: AC
  Administered 2017-08-29: 90 mL via NASOGASTRIC

## 2017-08-29 MED ORDER — MORPHINE SULFATE (PF) 2 MG/ML IV SOLN
2.0000 mg | Freq: Once | INTRAVENOUS | Status: AC
  Administered 2017-08-29: 2 mg via INTRAVENOUS

## 2017-08-29 NOTE — Progress Notes (Signed)
Notified Dr. Adonis Huguenin about patient having abd. Pain after giving her 2m of morphine at 0402. He did order for 26mmorphine once due 0615. Will continue to monitor patient after dose.

## 2017-08-29 NOTE — Progress Notes (Signed)
Chaplain responded to a consult for this pt who wanted to see a chaplain. Chaplain met pt. Pt said she wanted to speak with chaplain because she will have a surgery tomorrow, and had other personal concerns that she wanted to talk about. Pt talked about her daughter who passed away in Feb 15, 2023 this year and was feeling the loss more now as thanksgiving seasons kicks in, she talked about her boyfriend was deported to [place] and also spoke about her health challenges that have landed her in hospital twice in the period of 3 weeks. Pt was grieving the loss of her health, her daughter, and her boyfriend. However, pt was grateful for regaining a relationship with her dad, with whom, she had a strained relationship for a longtime. Pt asked [place] to offer prayers for her surgery and the concerns she mentioned, which the chaplain did. [place] to follow up with pt as needed.   08/29/17 1400  Clinical Encounter Type  Visited With Patient;Health care provider  Visit Type Initial;Spiritual support  Referral From Nurse  Consult/Referral To Chaplain  Spiritual Encounters  Spiritual Needs Other (Comment)

## 2017-08-29 NOTE — Progress Notes (Signed)
08/29/2017  Subjective: Patient had some pain last night when NG tube had been clamped for ambulation.  Pain was better when back to suction.  No flatus or BM yet.  Vital signs: Temp:  [98.4 F (36.9 C)-98.5 F (36.9 C)] 98.5 F (36.9 C) (11/14 0528) Pulse Rate:  [74-78] 74 (11/14 0528) Resp:  [16-18] 18 (11/14 0528) BP: (116-134)/(55-78) 122/78 (11/14 0528) SpO2:  [97 %-100 %] 97 % (11/14 0528)   Intake/Output: 11/13 0701 - 11/14 0700 In: 2198 [I.V.:1968; NG/GT:180; IV Piggyback:50] Out: 939 [Urine:600; Emesis/NG output:175] Last BM Date: 08/28/17  Physical Exam: Constitutional:  No acute distress Abdomen:  Soft, nondistended, with some tenderness in low abdomen, though minimal.  Labs:  Recent Labs    08/26/17 1931 08/27/17 0518  WBC 7.5 4.8  HGB 13.5 10.9*  HCT 39.4 31.7*  PLT 278 221   Recent Labs    08/28/17 0421 08/29/17 0433  NA 136 135  K 3.4* 4.4  CL 103 106  CO2 25 23  GLUCOSE 60* 95  BUN 20 12  CREATININE 0.86 0.86  CALCIUM 8.2* 8.4*   Recent Labs    08/27/17 0518  LABPROT 13.3  INR 1.02    Imaging: Dg Abd 1 View  Result Date: 08/29/2017 CLINICAL DATA:  Small bowel obstruction. EXAM: ABDOMEN - 1 VIEW COMPARISON:  Acute abdominal series 08/27/2017. FINDINGS: NG tube is in place. Multiple distended loops of small bowel are similar to the prior study. There is some gas in the colon. There is no free air. IMPRESSION: 1. Persistent dilated loops of small bowel compatible with small bowel obstruction. There is no significant interval change. 2. Some gas is present throughout the colon. The obstruction is incomplete. 3. NG tube is stable in the stomach. Electronically Signed   By: San Morelle M.D.   On: 08/29/2017 07:31    Assessment/Plan: 51 yo female with SBO  --Decreasing NG output from NG tube but no bowel function yet.  KUB without any major improvement.  Will attempt gastrograffin challenge today with 90 ml undiluted gastrograffin via  NG tube and follow up KUB 8 hrs later.  If no movement of the gastrograffin to colon, then will likely require surgery.  Discussed with patient this plan and if need for surgery, likely would be tomorrow. --NPO with NG tube, IV fluids for hydration. --[place], [place]

## 2017-08-30 ENCOUNTER — Inpatient Hospital Stay: Payer: Medicaid Other

## 2017-08-30 LAB — BASIC METABOLIC PANEL
Anion gap: 7 (ref 5–15)
BUN: 14 mg/dL (ref 6–20)
CO2: 25 mmol/L (ref 22–32)
Calcium: 8.6 mg/dL — ABNORMAL LOW (ref 8.9–10.3)
Chloride: 102 mmol/L (ref 101–111)
Creatinine, Ser: 0.91 mg/dL (ref 0.44–1.00)
GFR calc Af Amer: >60 mL/min (ref >60)
GLUCOSE: 82 mg/dL (ref 65–99)
POTASSIUM: 4.5 mmol/L (ref 3.5–5.1)
Sodium: 134 mmol/L — ABNORMAL LOW (ref 135–145)

## 2017-08-30 LAB — MAGNESIUM: Magnesium: 1.8 mg/dL (ref 1.7–2.4)

## 2017-08-30 MED ORDER — AMLODIPINE BESYLATE 10 MG PO TABS
10.0000 mg | ORAL_TABLET | Freq: Every day | ORAL | Status: DC
  Administered 2017-08-30 – 2017-08-31 (×2): 10 mg via ORAL
  Filled 2017-08-30 (×2): qty 1

## 2017-08-30 MED ORDER — PROCHLORPERAZINE EDISYLATE 5 MG/ML IJ SOLN
10.0000 mg | INTRAMUSCULAR | Status: DC | PRN
  Administered 2017-08-31 (×3): 10 mg via INTRAVENOUS
  Filled 2017-08-30 (×4): qty 2

## 2017-08-30 MED ORDER — SERTRALINE HCL 50 MG PO TABS
50.0000 mg | ORAL_TABLET | Freq: Every day | ORAL | Status: DC
  Administered 2017-08-30 – 2017-08-31 (×2): 50 mg via ORAL
  Filled 2017-08-30 (×2): qty 1

## 2017-08-30 NOTE — Progress Notes (Signed)
Patient nauseas, given Phenergan and Zofran IV and complains of hard abdomen. Patient had an episode of green and brown colored emesis. Notified Dr. Adonis Huguenin. Will continue to monitor patient.

## 2017-08-30 NOTE — Progress Notes (Signed)
Refused bed alarm.

## 2017-08-30 NOTE — Progress Notes (Signed)
  Called by nursing staff secondary to recurrent nausea, vomiting, abdominal pain  Went to evaluate patient.  She reports that the nausea is gradually been building since her NG tube was removed earlier today.  Reports she had what was to her large volume emesis in the trash can approximately 30 minutes ago.  On exam her abdomen is soft and nondistended.  It does appear to be tender however.  Discussed with the patient and her nurse that a different antiemetic medication would be attempted and a repeat abdominal film would be obtained.  Should she continue to have nausea and vomiting or should the abdominal films show a recurrence of her obstruction with an NG tube will be placed again.  Counseled the patient that this may represent a failure of resolution of her small bowel obstruction and that it may increase the odds of requiring an operation.  She voiced understanding.  Clayburn Pert, MD [facility]  Day [facility] [phone] Night [facility] [phone]

## 2017-08-30 NOTE — Progress Notes (Signed)
08/30/2017  Subjective: No acute events.  Patient had some cramping as the contrast was moving throughout the intestines yesterday.  Had bowel movement x 2 last night and less output from NG tube.  KUB done this morning shows contrast in the colon.  Vital signs: Temp:  [97.5 F (36.4 C)-98 F (36.7 C)] 97.5 F (36.4 C) (11/15 0346) Pulse Rate:  [57-92] 57 (11/15 0346) Resp:  [16] 16 (11/15 0346) BP: (129-141)/(72-79) 141/72 (11/15 0346) SpO2:  [98 %-100 %] 100 % (11/15 0346)   Intake/Output: 11/14 0701 - 11/15 0700 In: 2534.7 [I.V.:1944.7; NG/GT:540; IV Piggyback:50] Out: 1870 [Urine:200; Emesis/NG output:1670] Last BM Date: 08/27/17  Physical Exam: Constitutional: No acute distress Abdomen: Soft, nondistended, with mild tenderness to palpation in the mid abdomen.  Labs:  No results for input(s): WBC, HGB, HCT, PLT in the last 72 hours. Recent Labs    08/29/17 0433 08/30/17 0410  NA 135 134*  K 4.4 4.5  CL 106 102  CO2 23 25  GLUCOSE 95 82  BUN 12 14  CREATININE 0.86 0.91  CALCIUM 8.4* 8.6*   No results for input(s): LABPROT, INR in the last 72 hours.  Imaging: Dg Abd 1 View  Result Date: 08/30/2017 CLINICAL DATA:  Evaluate contrast movement after Gastrografin administration. EXAM: ABDOMEN - 1 VIEW COMPARISON:  08/29/2017. FINDINGS: Enteric contrast remains concentrated within the colon, increasingly dense compared with yesterday's radiograph. No signs of obstruction. Nasogastric tube tip lies near the duodenum. Cholecystectomy clips. Degenerative change lumbar spine. IMPRESSION: Enteric contrast remains within the colon, including the RIGHT, transverse, and descending colon. Electronically Signed   By: Staci Righter M.D.   On: 08/30/2017 08:47   Dg Abd 1 View  Result Date: 08/29/2017 CLINICAL DATA:  8 hour small-bowel follow-up study EXAM: ABDOMEN - 1 VIEW COMPARISON:  08/29/2017 FINDINGS: Scattered large and small bowel gas is noted. Persistent small bowel  dilatation is noted. Contrast material is noted scattered throughout the small bowel. Contrast is noted residual within the colon from the prior exam. The proximal right colon does not appear to contain contrast material. Continued follow-up is recommended. IMPRESSION: Contrast material is noted throughout the dilated small bowel. The right colon shows contrast within although this is stable from the earlier exam and related to prior studies. Continued follow-up is recommended. Electronically Signed   By: Inez Catalina M.D.   On: 08/29/2017 16:40    Assessment/Plan: 51 year old female with small bowel obstruction.  -KUB independently reviewed with contrast now concentrated throughout the colon.  This means that the contrast challenges she was given yesterday has moved through the small bowel past the obstruction and into the colon.  She has had 2 bowel movements with this.  At this point however given that the patient still having some abdominal discomfort we will attempt NG tube clamp trial for 4 hours to see how much output she has after residual is checked.  We may be able to remove the NG tube today if this residual is low.   Melvyn Neth, [place]

## 2017-08-31 ENCOUNTER — Inpatient Hospital Stay: Payer: Medicaid Other

## 2017-08-31 ENCOUNTER — Encounter: Payer: Self-pay | Admitting: Anesthesiology

## 2017-08-31 ENCOUNTER — Encounter
Admission: EM | Disposition: A | Discharge status: Home / Self Care | Payer: Self-pay | Source: Home / Self Care | Attending: Surgery

## 2017-08-31 ENCOUNTER — Inpatient Hospital Stay: Payer: Medicaid Other | Admitting: Registered Nurse

## 2017-08-31 HISTORY — PX: LAPAROTOMY: SHX154

## 2017-08-31 LAB — BASIC METABOLIC PANEL
Anion gap: 5 (ref 5–15)
BUN: 13 mg/dL (ref 6–20)
CALCIUM: 8.4 mg/dL — AB (ref 8.9–10.3)
CHLORIDE: 105 mmol/L (ref 101–111)
CO2: 23 mmol/L (ref 22–32)
CREATININE: 0.89 mg/dL (ref 0.44–1.00)
GFR calc non Af Amer: >60 mL/min (ref >60)
GLUCOSE: 80 mg/dL (ref 65–99)
Potassium: 5 mmol/L (ref 3.5–5.1)
Sodium: 133 mmol/L — ABNORMAL LOW (ref 135–145)

## 2017-08-31 LAB — MAGNESIUM: MAGNESIUM: 1.4 mg/dL — AB (ref 1.7–2.4)

## 2017-08-31 SURGERY — LAPAROTOMY, EXPLORATORY
Anesthesia: General | Site: Abdomen | Wound class: Clean Contaminated

## 2017-08-31 MED ORDER — ONDANSETRON HCL 4 MG/2ML IJ SOLN
INTRAMUSCULAR | Status: AC
  Administered 2017-08-31: 4 mg via INTRAVENOUS
  Filled 2017-08-31: qty 2

## 2017-08-31 MED ORDER — ONDANSETRON HCL 4 MG/2ML IJ SOLN
4.0000 mg | Freq: Once | INTRAMUSCULAR | Status: AC | PRN
  Administered 2017-08-31: 4 mg via INTRAVENOUS

## 2017-08-31 MED ORDER — SODIUM CHLORIDE FLUSH 0.9 % IV SOLN
INTRAVENOUS | Status: AC
  Filled 2017-08-31: qty 10

## 2017-08-31 MED ORDER — PHENYLEPHRINE HCL 10 MG/ML IJ SOLN
INTRAMUSCULAR | Status: DC | PRN
  Administered 2017-08-31: 200 ug via INTRAVENOUS
  Administered 2017-08-31: 100 ug via INTRAVENOUS

## 2017-08-31 MED ORDER — LIDOCAINE HCL (CARDIAC) 20 MG/ML IV SOLN
INTRAVENOUS | Status: DC | PRN
  Administered 2017-08-31: 100 mg via INTRAVENOUS

## 2017-08-31 MED ORDER — MAGNESIUM SULFATE 2 GM/50ML IV SOLN
2.0000 g | Freq: Once | INTRAVENOUS | Status: AC
  Administered 2017-08-31: 2 g via INTRAVENOUS
  Filled 2017-08-31: qty 50

## 2017-08-31 MED ORDER — SUCCINYLCHOLINE CHLORIDE 20 MG/ML IJ SOLN
INTRAMUSCULAR | Status: DC | PRN
  Administered 2017-08-31: 120 mg via INTRAVENOUS

## 2017-08-31 MED ORDER — PROPOFOL 10 MG/ML IV BOLUS
INTRAVENOUS | Status: AC
  Filled 2017-08-31: qty 20

## 2017-08-31 MED ORDER — BUPIVACAINE LIPOSOME 1.3 % IJ SUSP
INTRAMUSCULAR | Status: DC | PRN
  Administered 2017-08-31: 30 mL

## 2017-08-31 MED ORDER — SODIUM CHLORIDE 0.9 % IJ SOLN
INTRAMUSCULAR | Status: AC
  Filled 2017-08-31: qty 20

## 2017-08-31 MED ORDER — FENTANYL CITRATE (PF) 250 MCG/5ML IJ SOLN
INTRAMUSCULAR | Status: AC
  Filled 2017-08-31: qty 5

## 2017-08-31 MED ORDER — FENTANYL CITRATE (PF) 100 MCG/2ML IJ SOLN
INTRAMUSCULAR | Status: AC
  Administered 2017-08-31: 25 ug via INTRAVENOUS
  Filled 2017-08-31: qty 2

## 2017-08-31 MED ORDER — ROCURONIUM BROMIDE 50 MG/5ML IV SOLN
INTRAVENOUS | Status: AC
  Filled 2017-08-31: qty 1

## 2017-08-31 MED ORDER — DEXAMETHASONE SODIUM PHOSPHATE 10 MG/ML IJ SOLN
INTRAMUSCULAR | Status: AC
  Filled 2017-08-31: qty 1

## 2017-08-31 MED ORDER — KCL IN DEXTROSE-NACL 40-5-0.45 MEQ/L-%-% IV SOLN
INTRAVENOUS | Status: DC
  Administered 2017-09-01: 01:00:00 via INTRAVENOUS
  Filled 2017-08-31 (×3): qty 1000

## 2017-08-31 MED ORDER — BUPIVACAINE LIPOSOME 1.3 % IJ SUSP
INTRAMUSCULAR | Status: AC
  Filled 2017-08-31: qty 20

## 2017-08-31 MED ORDER — LACTATED RINGERS IV SOLN
INTRAVENOUS | Status: DC | PRN
  Administered 2017-08-31: 18:00:00 via INTRAVENOUS

## 2017-08-31 MED ORDER — CEFAZOLIN SODIUM-DEXTROSE 2-3 GM-%(50ML) IV SOLR
INTRAVENOUS | Status: DC | PRN
  Administered 2017-08-31: 2 g via INTRAVENOUS

## 2017-08-31 MED ORDER — ROCURONIUM BROMIDE 100 MG/10ML IV SOLN
INTRAVENOUS | Status: DC | PRN
  Administered 2017-08-31 (×2): 20 mg via INTRAVENOUS
  Administered 2017-08-31: 50 mg via INTRAVENOUS

## 2017-08-31 MED ORDER — FENTANYL CITRATE (PF) 100 MCG/2ML IJ SOLN
25.0000 ug | INTRAMUSCULAR | Status: DC | PRN
  Administered 2017-08-31 (×4): 25 ug via INTRAVENOUS

## 2017-08-31 MED ORDER — CEFAZOLIN SODIUM 1 G IJ SOLR
INTRAMUSCULAR | Status: AC
  Filled 2017-08-31: qty 20

## 2017-08-31 MED ORDER — LACTATED RINGERS IV SOLN
INTRAVENOUS | Status: DC | PRN
  Administered 2017-08-31 (×3): via INTRAVENOUS

## 2017-08-31 MED ORDER — MIDAZOLAM HCL 2 MG/2ML IJ SOLN
INTRAMUSCULAR | Status: DC | PRN
  Administered 2017-08-31: 2 mg via INTRAVENOUS

## 2017-08-31 MED ORDER — PROPOFOL 10 MG/ML IV BOLUS
INTRAVENOUS | Status: DC | PRN
  Administered 2017-08-31: 30 mg via INTRAVENOUS
  Administered 2017-08-31: 150 mg via INTRAVENOUS

## 2017-08-31 MED ORDER — FENTANYL CITRATE (PF) 100 MCG/2ML IJ SOLN
INTRAMUSCULAR | Status: DC | PRN
  Administered 2017-08-31 (×2): 50 ug via INTRAVENOUS
  Administered 2017-08-31: 100 ug via INTRAVENOUS
  Administered 2017-08-31 (×2): 25 ug via INTRAVENOUS

## 2017-08-31 MED ORDER — MIDAZOLAM HCL 2 MG/2ML IJ SOLN
INTRAMUSCULAR | Status: AC
  Filled 2017-08-31: qty 2

## 2017-08-31 MED ORDER — ONDANSETRON HCL 4 MG/2ML IJ SOLN
INTRAMUSCULAR | Status: DC | PRN
  Administered 2017-08-31: 4 mg via INTRAVENOUS

## 2017-08-31 MED ORDER — SUGAMMADEX SODIUM 500 MG/5ML IV SOLN
INTRAVENOUS | Status: DC | PRN
  Administered 2017-08-31: 300 mg via INTRAVENOUS

## 2017-08-31 MED ORDER — DEXAMETHASONE SODIUM PHOSPHATE 10 MG/ML IJ SOLN
INTRAMUSCULAR | Status: DC | PRN
  Administered 2017-08-31: 10 mg via INTRAVENOUS

## 2017-08-31 MED ORDER — BUPIVACAINE-EPINEPHRINE 0.25% -1:200000 IJ SOLN
INTRAMUSCULAR | Status: DC | PRN
  Administered 2017-08-31: 30 mL

## 2017-08-31 SURGICAL SUPPLY — 46 items
BULB RESERV EVAC DRAIN JP 100C (MISCELLANEOUS) ×3 IMPLANT
CANISTER SUCT 1200ML W/VALVE (MISCELLANEOUS) ×3 IMPLANT
CHLORAPREP W/TINT 10.5 ML (MISCELLANEOUS) ×3 IMPLANT
DRAIN CHANNEL JP 19F (MISCELLANEOUS) ×3 IMPLANT
DRAPE LAPAROTOMY T 102X78X121 (DRAPES) ×3 IMPLANT
DRSG OPSITE POSTOP 4X12 (GAUZE/BANDAGES/DRESSINGS) ×3 IMPLANT
DRSG OPSITE POSTOP 4X8 (GAUZE/BANDAGES/DRESSINGS) ×3 IMPLANT
DRSG TEGADERM 4X10 (GAUZE/BANDAGES/DRESSINGS) ×3 IMPLANT
ELECT CAUTERY BLADE 6.4 (BLADE) ×3 IMPLANT
ELECT REM PT RETURN 9FT ADLT (ELECTROSURGICAL) ×3
ELECTRODE REM PT RTRN 9FT ADLT (ELECTROSURGICAL) ×1 IMPLANT
GAUZE SPONGE 4X4 12PLY STRL (GAUZE/BANDAGES/DRESSINGS) ×3 IMPLANT
GLOVE SURG SYN 7.0 (GLOVE) ×6 IMPLANT
GLOVE SURG SYN 7.5  E (GLOVE) ×4
GLOVE SURG SYN 7.5 E (GLOVE) ×2 IMPLANT
GOWN STRL REUS W/ TWL LRG LVL3 (GOWN DISPOSABLE) ×4 IMPLANT
GOWN STRL REUS W/TWL LRG LVL3 (GOWN DISPOSABLE) ×8
HANDLE SUCTION POOLE (INSTRUMENTS) ×1 IMPLANT
LABEL OR SOLS (LABEL) ×3 IMPLANT
LIGASURE IMPACT 36 18CM CVD LR (INSTRUMENTS) ×3 IMPLANT
NDL SAFETY 22GX1.5 (NEEDLE) ×3 IMPLANT
NS IRRIG 1000ML POUR BTL (IV SOLUTION) ×3 IMPLANT
PACK BASIN MAJOR ARMC (MISCELLANEOUS) ×3 IMPLANT
PACK COLON CLEAN CLOSURE (MISCELLANEOUS) ×3 IMPLANT
RELOAD PROXIMATE 75MM BLUE (ENDOMECHANICALS) ×9 IMPLANT
RELOAD STAPLE SKIN SM 35W (MISCELLANEOUS) ×3 IMPLANT
SEPRAFILM MEMBRANE 5X6 (MISCELLANEOUS) ×3 IMPLANT
SPONGE DRAIN TRACH 4X4 STRL 2S (GAUZE/BANDAGES/DRESSINGS) ×3 IMPLANT
SPONGE KITTNER 5P (MISCELLANEOUS) ×3 IMPLANT
STAPLER PROXIMATE 75MM BLUE (STAPLE) ×3 IMPLANT
STAPLER SKIN PROX 35W (STAPLE) ×3 IMPLANT
SUCTION POOLE HANDLE (INSTRUMENTS) ×3
SUT ETHILON 3-0 FS-10 30 BLK (SUTURE) ×3
SUT PDS AB 1 TP1 96 (SUTURE) ×6 IMPLANT
SUT PROLENE 0 CT 1 30 (SUTURE) ×9 IMPLANT
SUT SILK 2 0 (SUTURE) ×2
SUT SILK 2-0 18XBRD TIE 12 (SUTURE) ×1 IMPLANT
SUT SILK 3-0 (SUTURE) ×6 IMPLANT
SUT VIC AB 2-0 SH 27 (SUTURE) ×4
SUT VIC AB 2-0 SH 27XBRD (SUTURE) ×2 IMPLANT
SUT VIC AB 3-0 SH 27 (SUTURE) ×2
SUT VIC AB 3-0 SH 27X BRD (SUTURE) ×1 IMPLANT
SUTURE EHLN 3-0 FS-10 30 BLK (SUTURE) ×1 IMPLANT
SYRINGE 10CC LL (SYRINGE) ×3 IMPLANT
SYRINGE IRR TOOMEY STRL 70CC (SYRINGE) ×3 IMPLANT
TRAY FOLEY W/METER SILVER 16FR (SET/KITS/TRAYS/PACK) ×3 IMPLANT

## 2017-08-31 NOTE — Anesthesia Preprocedure Evaluation (Addendum)
Anesthesia Evaluation  Patient identified by MRN, date of birth, ID band Patient awake    Reviewed: Allergy & Precautions, NPO status , Patient's Chart, lab work & pertinent test results  Airway Mallampati: II  TM Distance: <3 FB     Dental  (+) Chipped, Missing   Pulmonary neg pulmonary ROS,    Pulmonary exam normal        Cardiovascular hypertension, Pt. on medications Normal cardiovascular exam     Neuro/Psych PSYCHIATRIC DISORDERS Anxiety negative neurological ROS     GI/Hepatic negative GI ROS, Neg liver ROS,   Endo/Other  negative endocrine ROS  Renal/GU negative Renal ROS  negative genitourinary   Musculoskeletal negative musculoskeletal ROS (+)   Abdominal Normal abdominal exam  (+)   Peds negative pediatric ROS (+)  Hematology negative hematology ROS (+)   Anesthesia Other Findings Past Medical History: No date: Anxiety No date: Cancer ([place]) No date: Hypertension  Reproductive/Obstetrics                           Anesthesia Physical Anesthesia Plan  ASA: II and emergent  Anesthesia Plan: General   Post-op Pain Management:    Induction: Intravenous  PONV Risk Score and Plan:   Airway Management Planned: Oral ETT  Additional Equipment:   Intra-op Plan:   Post-operative Plan: Extubation in OR  Informed Consent: I have reviewed the patients History and Physical, chart, labs and discussed the procedure including the risks, benefits and alternatives for the proposed anesthesia with the patient or authorized representative who has indicated his/her understanding and acceptance.   Dental advisory given  Plan Discussed with: CRNA and Surgeon  Anesthesia Plan Comments:         Anesthesia Quick Evaluation

## 2017-08-31 NOTE — Anesthesia Postprocedure Evaluation (Signed)
Anesthesia Post Note  Patient: Joanna Hall  Procedure(s) Performed: EXPLORATORY LAPAROTOMY for SBO, ileocolectomy, removal of piece of uterine wall (N/A Abdomen)  Patient location during evaluation: PACU Anesthesia Type: General Level of consciousness: awake and alert and oriented Pain management: pain level controlled Vital Signs Assessment: post-procedure vital signs reviewed and stable Respiratory status: spontaneous breathing Cardiovascular status: blood pressure returned to baseline Anesthetic complications: no     Last Vitals:  Vitals:   08/31/17 2242 08/31/17 2324  BP: (!) 155/78 (!) 141/77  Pulse: 92 88  Resp: 18 18  Temp:  37.2 C  SpO2: 100% 100%    Last Pain:  Vitals:   08/31/17 2324  TempSrc: Oral  PainSc:                  [name]

## 2017-08-31 NOTE — Progress Notes (Signed)
08/31/2017  Subjective: NG tube was removed yesterday as KUB showed contrast in colon and she had a successful clamping trial.  However, overnight, patient had an episode of large volume emesis and NG tube was replaced.  KUB showed dilated loops of bowel, worse than on prior KUB.  Vital signs: Temp:  [97.4 F (36.3 C)-97.8 F (36.6 C)] 97.8 F (36.6 C) (11/16 0412) Pulse Rate:  [63-80] 80 (11/16 1142) Resp:  [16-20] 20 (11/16 1142) BP: (125-149)/(70-81) 132/81 (11/16 1142) SpO2:  [98 %-100 %] 99 % (11/16 1142)   Intake/Output: 11/15 0701 - 11/16 0700 In: 1943.7 [P.O.:120; I.V.:1823.7] Out: 350 [Urine:100; Emesis/NG output:250] Last BM Date: 08/30/17  Physical Exam: Constitutional:  No acute distress Abdomen:  Soft, mildly distended, mild tenderness to palpation over lower abdomen.  Labs:  No results for input(s): WBC, HGB, HCT, PLT in the last 72 hours. Recent Labs    08/30/17 0410 08/31/17 0316  NA 134* 133*  K 4.5 5.0  CL 102 105  CO2 25 23  GLUCOSE 82 80  BUN 14 13  CREATININE 0.91 0.89  CALCIUM 8.6* 8.4*   No results for input(s): LABPROT, INR in the last 72 hours.  Imaging: Dg Abd Portable 1v  Result Date: 08/31/2017 CLINICAL DATA:  Nasogastric tube placement. EXAM: PORTABLE ABDOMEN - 1 VIEW COMPARISON:  Abdominal radiograph performed 08/30/2017 FINDINGS: The patient's enteric tube is noted ending overlying the pylorus. There is dilatation of small-bowel loops to 4.9 cm in maximal diameter, possibly reflecting diffuse small bowel dysmotility. Clips are noted within the right upper quadrant, reflecting prior cholecystectomy. Degenerative change is noted along the lumbar spine. The visualized lung bases are grossly clear. IMPRESSION: Enteric tube noted ending overlying the pylorus. Electronically Signed   By: Garald Balding M.D.   On: 08/31/2017 02:07   Dg Abd Portable 2v  Result Date: 08/31/2017 CLINICAL DATA:  Acute onset of generalized abdominal pain. EXAM:  PORTABLE ABDOMEN - 2 VIEW COMPARISON:  Abdominal radiograph performed earlier today at 7:29 a.m. FINDINGS: There is distention of small-bowel loops to 5.4 cm in maximal diameter, with associated air-fluid levels. However, on the recent prior study, contrast has progressed to the colon. This may reflect significant small bowel dysmotility, without definite obstruction. No free intra-abdominal air is identified on the provided decubitus view. The visualized osseous structures are within normal limits; the sacroiliac joints are unremarkable in appearance. The visualized lung bases are essentially clear. Clips are noted within the right upper quadrant, reflecting prior cholecystectomy. IMPRESSION: Persistent distention of small-bowel loops to 5.4 cm in maximal diameter, with associated air-fluid levels. However, on the recent prior study, contrast has progressed to the colon. This may reflect significant small bowel dysmotility, without definite evidence of obstruction. No free intra-abdominal air seen. Electronically Signed   By: Garald Balding M.D.   On: 08/31/2017 01:13    Assessment/Plan: 51 yo female with SBO  --discussed with patient that given emesis and NG tube going back in, clearly her SBO has not well resolved and she would need surgical intervention. --will plan for exlap with small bowel resection today pending OR availability, otherwise tomorrow.  Risks of bleeding, infection, injury to surrounding structures were discussed with patient and she's willing to proceed.   Melvyn Neth, [place]

## 2017-08-31 NOTE — Anesthesia Procedure Notes (Signed)
Procedure Name: Intubation Date/Time: 08/31/2017 6:15 PM Performed by: Doreen Salvage, CRNA Pre-anesthesia Checklist: Patient identified, Emergency Drugs available, Suction available and Patient being monitored Patient Re-evaluated:Patient Re-evaluated prior to induction Oxygen Delivery Method: Circle system utilized Preoxygenation: Pre-oxygenation with 100% oxygen Induction Type: IV induction, Cricoid Pressure applied and Rapid sequence Ventilation: Mask ventilation without difficulty Laryngoscope Size: Mac and 3 Grade View: Grade II Tube type: Oral Tube size: 7.0 mm Number of attempts: 1 Airway Equipment and Method: Stylet Placement Confirmation: ETT inserted through vocal cords under direct vision,  positive ETCO2 and breath sounds checked- equal and bilateral Secured at: 21 cm Tube secured with: Tape Dental Injury: Teeth and Oropharynx as per pre-operative assessment

## 2017-08-31 NOTE — Transfer of Care (Signed)
Immediate Anesthesia Transfer of Care Note  Patient: Joanna Hall  Procedure(s) Performed: Procedure(s): EXPLORATORY LAPAROTOMY for SBO, ileocolectomy, removal of piece of uterine wall (N/A)  Patient Location: PACU  Anesthesia Type:General  Level of Consciousness: sedated  Airway & Oxygen Therapy: Patient Spontanous Breathing and Patient connected to face mask oxygen  Post-op Assessment: Report given to RN and Post -op Vital signs reviewed and stable  Post vital signs: Reviewed and stable  Last Vitals:  Vitals:   08/31/17 1142 08/31/17 2125  BP: 132/81 136/73  Pulse: 80 78  Resp: 20 16  Temp:  36.7 C  SpO2: 46% 962%    Complications: No apparent anesthesia complications

## 2017-08-31 NOTE — Anesthesia Post-op Follow-up Note (Signed)
Anesthesia QCDR form completed.        

## 2017-08-31 NOTE — Op Note (Addendum)
Procedure Date:  08/31/2017  Pre-operative Diagnosis:  Small bowel obstruction  Post-operative Diagnosis:  Small bowel obstruction  Procedure:  Exploratory Laparotomy, extensive lysis of adhesions, right ileocolectomy  Surgeon:  Melvyn Neth, MD  Assistant:  Nestor Lewandowsky, MD  Anesthesia:  General endotracheal  Estimated Blood Loss:  50 ml  Specimens:  Terminal ileum and right colon, uterine wall scar  Complications:  None  Indications for Procedure:  This is a 51 y.o. female who presents with small bowel obstruction, non-resolving with conservative measures.  The risks of bleeding, abscess or infection, injury to surrounding structures, and need for further procedures were all discussed with the patient and was willing to proceed.  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 Genevive Bi to assist with the case.  Description of Procedure: The patient was correctly identified in the preoperative area and brought into the operating room.  The patient was placed supine with VTE prophylaxis in place.  Appropriate time-outs were performed.  Anesthesia was induced and the patient was intubated.  Foley catheter was placed.  Appropriate antibiotics were infused.  The abdomen was prepped and draped in a sterile fashion.  A midline incision was made and electrocautery was used to dissect down the subcutaneous tissue to the fascia.  The fascia was incised and extended superiorly and inferiorly.  With Dr. Genevive Bi help, we then proceeded to an extensive lysis of adhesions lasting more than 90 minutes as the distal small bowel was severely adhered between bowel loops as well as to the uterine wall and the right lower abdominal wall.  All this lysis of adhesions was done sharply with Metzenbaum scissors.  When the small bowel was finally freed from the pelvis, there was a long segment of small bowel which was very thickened, matted, and  scarred.  Unfortunately it was just proximal to the ileocecal valve and would not allow [place] to only do a small bowel resection with primary anastomosis.  Thus it was decided to perform a right ileocolectomy.  The right colon was mobilized off the lateral wall dividing at the white line of [place].  We took this dissection all the way to the hepatic flexure and transverse colon.  The omentum of the proximal transverse colon was separated as well using cautery.  We then created a window in the mesentery of the transverse colon distally as well as a window in the small bowel proximally and fired 2 loads of GIA 75 mm blue load stapler across the small bowel and the transverse colon.  Using the LigaSure, the small bowel and right colon segments were resected off their respective mesenteries with good hemostasis.  Specimen was then sent out in bulk.  The distal end of the small bowel and the proximal end of the transverse colon were lined up with two 3-0 silks.  2 enterotomies were created at the tips to fit the stapler and another load of 75 mm blue load stapler was fired creating a common channel.  The staple line internally was not bleeding.  We then lined up the edges of the opening of the common channel with Allis clamps and fired another load of the 75 mm stapler blue load to close the common channel.  The staple line was then imbricated using 3-0 silk sutures.  The mesenteric defect was then closed using a running 3-0 Vicryl suture.  The bowel was run to inspect for any injuries and there  were none.  The uterine wall where the small bowel was scarred onto had an area of weakening which was oversown using 2-0 Vicryl.  A small 0.5 cm segment was removed and sent to pathology as well.  The abdomen was thoroughly irrigated using 2 L of normal saline.  A 19 [place] Blake drain was placed in the pelvis near the area of significant dissection earlier.  60 ml of Exparel solution was then infiltrated into the peritoneum,  fascia, and subcutaneous tissue over the midline incision.  The fascia was then closed using 2 running looped PDS sutures.  The midline incision was then cleaned and irrigated and then closed using staples.  The Blake drain was secured to the skin using 3-0 nylon suture.  The abdomen was cleaned and the midline incision was dressed using a honeycomb dressing and the drain was dressed with a 4 x 4 gauze and tape.   The patient was emerged from anesthesia and extubated and brought to the recovery room for further management.  The patient tolerated the procedure well and all counts were correct at the end of the case.   Melvyn Neth, MD

## 2017-09-01 ENCOUNTER — Encounter: Payer: Self-pay | Admitting: Surgery

## 2017-09-01 LAB — BASIC METABOLIC PANEL
ANION GAP: 7 (ref 5–15)
Anion gap: 8 (ref 5–15)
BUN: 12 mg/dL (ref 6–20)
BUN: 16 mg/dL (ref 6–20)
CHLORIDE: 103 mmol/L (ref 101–111)
CHLORIDE: 101 mmol/L (ref 101–111)
CO2: 19 mmol/L — ABNORMAL LOW (ref 22–32)
CO2: 22 mmol/L (ref 22–32)
Calcium: 8.1 mg/dL — ABNORMAL LOW (ref 8.9–10.3)
Calcium: 8.4 mg/dL — ABNORMAL LOW (ref 8.9–10.3)
Creatinine, Ser: 0.96 mg/dL (ref 0.44–1.00)
Creatinine, Ser: 0.95 mg/dL (ref 0.44–1.00)
GFR calc Af Amer: >60 mL/min (ref >60)
GFR calc Af Amer: >60 mL/min (ref >60)
GFR calc non Af Amer: >60 mL/min (ref >60)
Glucose, Bld: 188 mg/dL — ABNORMAL HIGH (ref 65–99)
Glucose, Bld: 130 mg/dL — ABNORMAL HIGH (ref 65–99)
POTASSIUM: 4.1 mmol/L (ref 3.5–5.1)
Potassium: 5.5 mmol/L — ABNORMAL HIGH (ref 3.5–5.1)
SODIUM: 130 mmol/L — AB (ref 135–145)
SODIUM: 130 mmol/L — AB (ref 135–145)

## 2017-09-01 LAB — CBC WITH DIFFERENTIAL/PLATELET
Basophils Absolute: 0 10*3/uL (ref 0–0.1)
Basophils Relative: 0 %
Eosinophils Absolute: 0 10*3/uL (ref 0–0.7)
Eosinophils Relative: 0 %
HCT: 33.1 % — ABNORMAL LOW (ref 35.0–47.0)
Hemoglobin: 11.2 g/dL — ABNORMAL LOW (ref 12.0–16.0)
LYMPHS ABS: 0.3 10*3/uL — AB (ref 1.0–3.6)
Lymphocytes Relative: 5 %
MCH: 33 pg (ref 26.0–34.0)
MCHC: 33.7 g/dL (ref 32.0–36.0)
MCV: 97.7 fL (ref 80.0–100.0)
Monocytes Absolute: 0.2 10*3/uL (ref 0.2–0.9)
Monocytes Relative: 3 %
Neutro Abs: 6.2 10*3/uL (ref 1.4–6.5)
Neutrophils Relative %: 92 %
PLATELETS: 209 10*3/uL (ref 150–440)
RBC: 3.39 MIL/uL — AB (ref 3.80–5.20)
RDW: 13.7 % (ref 11.5–14.5)
WBC: 6.8 10*3/uL (ref 3.6–11.0)

## 2017-09-01 LAB — COMPREHENSIVE METABOLIC PANEL
ALK PHOS: 111 U/L (ref 38–126)
ALT: 31 U/L (ref 14–54)
ANION GAP: 5 (ref 5–15)
AST: 46 U/L — AB (ref 15–41)
Albumin: 2.4 g/dL — ABNORMAL LOW (ref 3.5–5.0)
BILIRUBIN TOTAL: 0.6 mg/dL (ref 0.3–1.2)
BUN: 14 mg/dL (ref 6–20)
CALCIUM: 7.9 mg/dL — AB (ref 8.9–10.3)
CO2: 23 mmol/L (ref 22–32)
Chloride: 103 mmol/L (ref 101–111)
Creatinine, Ser: 0.99 mg/dL (ref 0.44–1.00)
GFR calc Af Amer: >60 mL/min (ref >60)
GFR calc non Af Amer: >60 mL/min (ref >60)
Glucose, Bld: 118 mg/dL — ABNORMAL HIGH (ref 65–99)
POTASSIUM: 5.6 mmol/L — AB (ref 3.5–5.1)
SODIUM: 131 mmol/L — AB (ref 135–145)
TOTAL PROTEIN: 6 g/dL — AB (ref 6.5–8.1)

## 2017-09-01 LAB — TRIGLYCERIDES: TRIGLYCERIDES: 44 mg/dL (ref <150)

## 2017-09-01 LAB — PREALBUMIN: Prealbumin: <5 mg/dL — ABNORMAL LOW (ref 18–38)

## 2017-09-01 LAB — PHOSPHORUS
Phosphorus: 4.1 mg/dL (ref 2.5–4.6)
Phosphorus: 3.6 mg/dL (ref 2.5–4.6)

## 2017-09-01 LAB — MAGNESIUM
MAGNESIUM: 1.3 mg/dL — AB (ref 1.7–2.4)
MAGNESIUM: 2.2 mg/dL (ref 1.7–2.4)

## 2017-09-01 LAB — GLUCOSE, CAPILLARY
GLUCOSE-CAPILLARY: 81 mg/dL (ref 65–99)
GLUCOSE-CAPILLARY: 119 mg/dL — AB (ref 65–99)

## 2017-09-01 MED ORDER — FAT EMULSION 20 % IV EMUL
250.0000 mL | INTRAVENOUS | Status: AC
  Administered 2017-09-01: 250 mL via INTRAVENOUS
  Filled 2017-09-01: qty 250

## 2017-09-01 MED ORDER — KETOROLAC TROMETHAMINE 30 MG/ML IJ SOLN
30.0000 mg | Freq: Four times a day (QID) | INTRAMUSCULAR | Status: DC
  Administered 2017-09-01 – 2017-09-05 (×16): 30 mg via INTRAVENOUS
  Filled 2017-09-01 (×16): qty 1

## 2017-09-01 MED ORDER — INSULIN ASPART 100 UNIT/ML ~~LOC~~ SOLN: 0.0000 [IU] | SUBCUTANEOUS | Status: DC

## 2017-09-01 MED ORDER — TRACE MINERALS CR-CU-MN-SE-ZN 10-1000-500-60 MCG/ML IV SOLN
INTRAVENOUS | Status: DC
  Filled 2017-09-01: qty 960

## 2017-09-01 MED ORDER — MAGNESIUM SULFATE 50 % IJ SOLN
3.0000 g | Freq: Once | INTRAVENOUS | Status: AC
  Administered 2017-09-01: 3 g via INTRAVENOUS
  Filled 2017-09-01: qty 6

## 2017-09-01 MED ORDER — HYDROMORPHONE HCL 1 MG/ML IJ SOLN
0.5000 mg | INTRAMUSCULAR | Status: DC | PRN
  Administered 2017-09-01 – 2017-09-04 (×14): 0.5 mg via INTRAVENOUS
  Filled 2017-09-01 (×16): qty 0.5

## 2017-09-01 MED ORDER — OXYCODONE HCL 5 MG PO TABS
5.0000 mg | ORAL_TABLET | Freq: Once | ORAL | Status: AC
  Administered 2017-09-01: 5 mg via ORAL
  Filled 2017-09-01: qty 1

## 2017-09-01 MED ORDER — SODIUM CHLORIDE 0.9 % IV SOLN
INTRAVENOUS | Status: DC
  Administered 2017-09-01 – 2017-09-02 (×3): via INTRAVENOUS

## 2017-09-01 MED ORDER — TRACE MINERALS CR-CU-MN-SE-ZN 10-1000-500-60 MCG/ML IV SOLN
INTRAVENOUS | Status: AC
  Administered 2017-09-01: 18:00:00 via INTRAVENOUS
  Filled 2017-09-01: qty 960

## 2017-09-01 NOTE — Progress Notes (Addendum)
PHARMACY - ADULT TOTAL PARENTERAL NUTRITION CONSULT NOTE   Pharmacy Consult for TPN/Electrolytes/Glucose Indication: SBO  Patient Measurements: Height: 5' 9"  (175.3 cm) Weight: 156 lb 4.8 oz (70.9 kg) IBW/kg (Calculated) : 66.2 TPN AdjBW (KG): 70.9 Body mass index is 23.08 kg/m.  Assessment:  [name] s/p ex lap and right ileocolectomy for SBO to begin TPN. Patient has a several week history including a hospital stay in [place] for the same and is at risk for refeeding syndrome.   GI:  Endo:  Insulin requirements in the past 24 hours:   Lytes: Sodium (mmol/L)  Date Value  09/01/2017 131 (L)   Potassium (mmol/L)  Date Value  09/01/2017 5.6 (H)   Phosphorus (mg/dL)  Date Value  09/01/2017 4.1   Magnesium (mg/dL)  Date Value  09/01/2017 1.3 (L)   Calcium (mg/dL)  Date Value  09/01/2017 7.9 (L)   Albumin (g/dL)  Date Value  09/01/2017 2.4 (L)    Renal: Pulm: Cards:  Hepatobil: Neuro: ID:  Best Practices: TPN Access: TPN start date:  Nutritional Goals (per RD recommendation on : N/A kCal: Protein:   Current Nutrition: Clinimix 5/15 without electrolytes at 40 ml/hr pending dietitian evaluation  Plan:  5/15 Clinimix no electrolytes at 31m/hr Hold lipids pending dietitian evaluation NS IVMF at 125 ml/hr MVI and trace elements in TPN SSI and adjust as needed Magnesium replaced per surgery. Will follow labs closely and potentially recheck electrolytes this PM if central line placed and TPN started.  Monitor TPN labs F/U electrolytes, glucose   CUlice DashD 09/01/2017,1:10 PM   11/17 PM: Due to unavailability of Clinimix 5/15 without electrolytes, will order Clinimix 5/20 without electrolytes at 40 ml/hr. Also, after discussion with dietitian, will proceed with lipids at this point.

## 2017-09-01 NOTE — Progress Notes (Signed)
09/01/2017  Subjective: Patient is 1 Day Post-Op s/p exlap with right ileocolectomy for small bowel obstruction.  No acute events overnight but patient having incisional pain.   Vital signs: Temp:  [98.1 F (36.7 C)-98.9 F (37.2 C)] 98.1 F (36.7 C) (11/17 1300) Pulse Rate:  [70-100] 89 (11/17 1300) Resp:  [16-30] 17 (11/17 1300) BP: (113-155)/(68-78) 124/72 (11/17 1300) SpO2:  [96 %-100 %] 97 % (11/17 1300)   Intake/Output: 11/16 0701 - 11/17 0700 In: 4181.7 [I.V.:4181.7] Out: 5038 [Urine:700; Emesis/NG output:120; Drains:275; Blood:50] Last BM Date: 08/31/17  Physical Exam: Constitutional: No acute distress Abdomen:  Soft, nondistended, appropriately tender to palpation.  Midline incision clean, dry, intact with staples in place.  JP drain with serosanguinous fluid.    Labs:  Recent Labs    09/01/17 0439  WBC 6.8  HGB 11.2*  HCT 33.1*  PLT 209   Recent Labs    09/01/17 0439 09/01/17 1115  NA 130* 131*  K 5.5* 5.6*  CL 103 103  CO2 19* 23  GLUCOSE 188* 118*  BUN 12 14  CREATININE 0.96 0.99  CALCIUM 8.1* 7.9*   No results for input(s): LABPROT, INR in the last 72 hours.  Imaging: No results found.  Assessment/Plan: 51 yo female s/p exlap and right ileocolectomy  --continue NG to suction and NPO with IV fluid hydration --awaiting return of bowel function --order for PICC line and start TPN today, as will likely has post-op ileus and she's been without adequate nutrition --d/c foley catheter --OOB, ambulate   Melvyn Neth, [place]

## 2017-09-01 NOTE — Progress Notes (Signed)
Initial Nutrition Assessment  DOCUMENTATION CODES:   Not applicable  INTERVENTION:   Recommend Clinimix 5/15 without electrolytes at 27m/hr + 20% lipids @20ml /hr x 12 hrs/day (Goal rate 838mhr once labs stable)  Can advance to goal rate on 11/18 if labs stabilized.   Goal regimen provides 1894kcal/day, 100g/day protein, 223221molume  Add MVI daily   Add IV thiamine 100m70mily   Pt at high refeeding risk; recommend check P, K, and Mg daily for 3 days after TPN iniatition.   Daily weights  Recommend decrease IVF to 85ml53m NUTRITION DIAGNOSIS:   Inadequate oral intake related to acute illness(SBO) as evidenced by NPO status.  GOAL:   Patient will meet greater than or equal to 90% of their needs  MONITOR:   Labs, Weight trends, Skin, I & O's, Other (Comment)(TPN)  REASON FOR ASSESSMENT:   Consult New TPN/TNA  ASSESSMENT:   51 y.69 female with recurrent small bowel obstruction, complicated by pertinent comorbidities including cervical cancer, history or radiotherapy, and hypertension. Pt s/p exploratory Laparotomy, extensive lysis of adhesions, right ileocolectomy 11/16  Pt with inadequate oral intake for >7days. Initiate TPN, NGT to LIS. Pt at high refeeding risk; recommend monitor P, Mg, and K for at least 3 days. No weight history in chart for this pt; monitor daily weights.    Medications reviewed and include: lovenox, insulin, protonix, NaCl @125ml /hr, hydromorphone, phenergan   Labs reviewed: Na 131(L), K 5.6(H), Ca 7.9(L) adj. 9.18 wnl, alb 2.4(L) Triglycerides- 44 P 4.1 wnl Mg 1.3(L)- 11/17 cbgs- 188, 118 x 24hrs   Unable to complete Nutrition-Focused physical exam at this time.   Diet Order:  Diet NPO time specified Except for: Sips with Meds, Ice Chips TPN (CLINIMIX) Adult without lytes  EDUCATION NEEDS:   Not appropriate for education at this time  Skin:  Skin Assessment: (incision abdomen )  Last BM:  11/16- type 7  Height:    Ht Readings from Last 1 Encounters:  08/27/17 5' 9"  (1.753 m)    Weight:   Wt Readings from Last 1 Encounters:  08/27/17 156 lb 4.8 oz (70.9 kg)    Ideal Body Weight:  65.9 kg  BMI:  Body mass index is 23.08 kg/m.  Estimated Nutritional Needs:   Kcal:  1700-2000kcal/day   Protein:  99-113g/day   Fluid:  >1.7L/day   CaseyKoleen DistanceRD, LDN Pager #- [phone] Hours Pager: [phone]

## 2017-09-01 NOTE — Progress Notes (Signed)
Spoke with RN re [facility] will need to be  called for PICC line placement, unless able to wait until Monday for access.

## 2017-09-01 NOTE — Progress Notes (Signed)
PHARMACY - ADULT TOTAL PARENTERAL NUTRITION CONSULT NOTE   Pharmacy Consult for TPN/Electrolytes/Glucose Indication: SBO  Patient Measurements: Height: 5' 9"  (175.3 cm) Weight: (!) 335 lb 1.6 oz (152 kg) IBW/kg (Calculated) : 66.2 TPN AdjBW (KG): 70.9 Body mass index is 49.49 kg/m.  Assessment:  [name] s/p ex lap and right ileocolectomy for SBO to begin TPN. Patient has a several week history including a hospital stay in [place] for the same and is at risk for refeeding syndrome.   GI:  Endo:  Insulin requirements in the past 24 hours:   Lytes: Sodium (mmol/L)  Date Value  09/01/2017 130 (L)   Potassium (mmol/L)  Date Value  09/01/2017 4.1   Phosphorus (mg/dL)  Date Value  09/01/2017 3.6   Magnesium (mg/dL)  Date Value  09/01/2017 2.2   Calcium (mg/dL)  Date Value  09/01/2017 8.4 (L)   Albumin (g/dL)  Date Value  09/01/2017 2.4 (L)    Renal: Pulm: Cards:  Hepatobil: Neuro: ID:  Best Practices: TPN Access: TPN start date:  Nutritional Goals (per RD recommendation on : N/A kCal: Protein:   Current Nutrition: Clinimix 5/15 without electrolytes at 40 ml/hr pending dietitian evaluation  Plan:  5/15 Clinimix no electrolytes at 67m/hr Hold lipids pending dietitian evaluation NS IVMF at 125 ml/hr MVI and trace elements in TPN SSI and adjust as needed Magnesium replaced per surgery. Will follow labs closely and potentially recheck electrolytes this PM if central line placed and TPN started.  Monitor TPN labs F/U electrolytes, glucose    11/17 PM: Due to unavailability of Clinimix 5/15 without electrolytes, will order Clinimix 5/20 without electrolytes at 40 ml/hr. Also, after discussion with dietitian, will proceed with lipids at this point.   11/17 1952 K: 4.1, Mg: 2.2, Phos: 3.6- No additional electrolyte replacement needed as this. Will follow up with AM labs.   SPernell Dupre PharmD, BCPS Clinical Pharmacist 09/01/2017 9:15  PM

## 2017-09-01 NOTE — Progress Notes (Signed)
Patient assisted to bathroom to urinate. Patient tolerated ambulation well.

## 2017-09-02 LAB — GLUCOSE, CAPILLARY
GLUCOSE-CAPILLARY: 102 mg/dL — AB (ref 65–99)
GLUCOSE-CAPILLARY: 102 mg/dL — AB (ref 65–99)
GLUCOSE-CAPILLARY: 111 mg/dL — AB (ref 65–99)
Glucose-Capillary: 100 mg/dL — ABNORMAL HIGH (ref 65–99)
Glucose-Capillary: 91 mg/dL (ref 65–99)
Glucose-Capillary: 91 mg/dL (ref 65–99)

## 2017-09-02 LAB — BASIC METABOLIC PANEL
Anion gap: 6 (ref 5–15)
BUN: 16 mg/dL (ref 6–20)
CALCIUM: 7.9 mg/dL — AB (ref 8.9–10.3)
CO2: 24 mmol/L (ref 22–32)
CREATININE: 0.84 mg/dL (ref 0.44–1.00)
Chloride: 103 mmol/L (ref 101–111)
GFR calc Af Amer: >60 mL/min (ref >60)
GLUCOSE: 94 mg/dL (ref 65–99)
Potassium: 4.1 mmol/L (ref 3.5–5.1)
Sodium: 133 mmol/L — ABNORMAL LOW (ref 135–145)

## 2017-09-02 LAB — PHOSPHORUS: PHOSPHORUS: 3.1 mg/dL (ref 2.5–4.6)

## 2017-09-02 LAB — MAGNESIUM: MAGNESIUM: 1.7 mg/dL (ref 1.7–2.4)

## 2017-09-02 MED ORDER — FAT EMULSION 20 % IV EMUL
250.0000 mL | INTRAVENOUS | Status: AC
  Administered 2017-09-02: 250 mL via INTRAVENOUS
  Filled 2017-09-02: qty 250

## 2017-09-02 MED ORDER — THIAMINE HCL 100 MG/ML IJ SOLN
INTRAVENOUS | Status: AC
  Administered 2017-09-02: 18:00:00 via INTRAVENOUS
  Filled 2017-09-02: qty 1992

## 2017-09-02 MED ORDER — MAGNESIUM SULFATE 2 GM/50ML IV SOLN
2.0000 g | Freq: Once | INTRAVENOUS | Status: AC
  Administered 2017-09-02: 2 g via INTRAVENOUS
  Filled 2017-09-02: qty 50

## 2017-09-02 NOTE — Progress Notes (Signed)
PHARMACY - ADULT TOTAL PARENTERAL NUTRITION CONSULT NOTE   Pharmacy Consult for TPN/Electrolytes/Glucose Indication: SBO  Patient Measurements: Height: 5' 9"  (175.3 cm) Weight: 157 lb 4.8 oz (71.4 kg)(bed scale - previous weight likely inaccurate) IBW/kg (Calculated) : 66.2 TPN AdjBW (KG): 70.9 Body mass index is 23.23 kg/m.  Assessment:  [name] s/p ex lap and right ileocolectomy for SBO to begin TPN. Patient has a several week history including a hospital stay in [place] for the same and is at risk for refeeding syndrome.   GI: NPO Endo:  Insulin requirements in the past 24 hours: none  Lytes: Sodium (mmol/L)  Date Value  09/02/2017 133 (L)   Potassium (mmol/L)  Date Value  09/02/2017 4.1   Phosphorus (mg/dL)  Date Value  09/02/2017 3.1   Magnesium (mg/dL)  Date Value  09/02/2017 1.7   Calcium (mg/dL)  Date Value  09/02/2017 7.9 (L)   Albumin (g/dL)  Date Value  09/01/2017 2.4 (L)    Renal: Pulm: Cards:  Hepatobil: Neuro: ID:  Best Practices: TPN Access: TPN start date:  Nutritional Goals (per RD recommendation on : N/A kCal: Protein:   Current Nutrition: Clinimix 5/15 with electrolytes at 83 ml/hr   Plan:   Clinimix 5/15w/ electrolytes at goal rate 2m/hr w/ MVI, trace and thiamine. Lipids 20% at 20 ml/hr x 12 hrs NS IVMF at 50 ml/hr SSI and adjust as needed Monitor TPN labs F/U electrolytes, glucose   11/18: Mag= 1.7. Will order Magnesium sulfate 2 gram IV x 1. F/u electrolytes with am labs.    MNoralee Space PharmD, BCPS Clinical Pharmacist 09/02/2017 1:11 PM

## 2017-09-02 NOTE — Progress Notes (Signed)
Nutrition Follow-Up Assessment  DOCUMENTATION CODES:   Non-severe (moderate) malnutrition in context of chronic illness  INTERVENTION:  Recommend advancing to Clinimix E 5/15 at 83 mL/hr + 20% ILE at 20 mL/hr over 12 hours. Goal regimen provides 1894 kcal, 100 grams protein, 1992 mL fluid daily.  Add thiamine 100 mg daily to TPN due to risk of refeeding syndrome.  Total fluid rate per MD is 100 mL/hr between TPN and other IV fluids.  Monitor magnesium, potassium, and phosphorus daily for at least 3 days, MD to replete as needed, as pt is at risk for refeeding syndrome given moderate chronic malnutrition, inadequate intake for >1 week.  Recommend checking vitamin B12 level yearly to assure patient is absorbing adequately. Also recommend monitoring for diarrhea once diet advances as patient is at risk for bile acid malabsorption. Discussed with patient.  Patient would benefit from oral nutrition supplement once diet able to be advanced. Recommend Ensure Enlive as patient typically drinks Boost Plus.  NUTRITION DIAGNOSIS:   Moderate Malnutrition related to chronic illness(cervical cancer s/p chemotherapy and XRT) as evidenced by mild fat depletion, moderate fat depletion, mild muscle depletion.  GOAL:   Patient will meet greater than or equal to 90% of their needs  MONITOR:   Diet advancement, Labs, Weight trends, Skin, I & O's  REASON FOR ASSESSMENT:   Consult New TPN/TNA  ASSESSMENT:   51 y.o. female with recurrent small bowel obstruction, complicated by pertinent comorbidities including cervical cancer, history or radiotherapy, and hypertension. Pt s/p exploratory Laparotomy, extensive lysis of adhesions, right ileocolectomy 11/16  Met with patient at bedside. She reports she lives in [place], [place] and was recently hospitalized approximately 2-3 weeks ago for the same issue, which resolved after conservative management. She reports after 6 days during that admission with NGT  to [facility] she had return of bowel function and her diet was able to be advanced. After discharging from that admission she was only eating small amounts of food (bites of sandwiches or soup). She reports she has only been eating very poorly for the past 3-4 weeks. She has not had the best appetite this past year in setting of her treatment for cervical cancer (finished chemotherapy and XRT in August). She reports she has had N/V since January and has had trouble keeping down food. She has also had watery diarrhea. She typically drinks Boost Plus TID with meals when able to. Since her operation on 11/16 patient reports she has not yet had flatus or a bowel movement. She has ambulated to the bathroom and plans to ambulate around the unit today. Denies any nausea. Reports only some pain and tenderness in her abdomen.  UBW was 235 lbs at the start of her treatment in January 2018. She reports that PTA she was approximately 157 lbs. That is a reported weight loss of 78 lbs (33.2% body weight) over the past 10 months, which is significant for time frame. Unsure if patient has truly lost that much weight over such a short amount of time as the findings on NFPE are not as severe as the reported weight loss (may have occurred over longer period of time). Weight of 335 lbs from 11/17 likely inaccurate. RD obtained bed scale weight of 157.3 lbs (71.4 kg) today.  IV Access: PICC triple lumen placed 09/01/2017  TPN: yesterday pt was started on Clinimix 5/20 (no electrolytes) at 40 mL/hr + 20% ILE at 20 mL/hr over 12 hours  Medications reviewed and include: Novolog 0-9 units Q4hrs (  none required past 24 hours), pantoprazole, NS @ 50 mL/hr.  Labs reviewed: CBG 81-119 past 24 hrs, Sodium 133. Potassium, Phosphorus, and Magnesium all WNL. Triglycerides 44 on 11/17.  I/O: 460 mL output from NGT (another 100 mL output present at time of RD assessment); 205 mL output from JP drain  Discussed with Surgeon. Though terminal  ileum was resected during surgery, she likely has enough of her distal ileum left where she should not have issues with absorption of vitamin B12 or bile salts.  NUTRITION - FOCUSED PHYSICAL EXAM:    Most Recent Value  Orbital Region  Moderate depletion  Upper Arm Region  Moderate depletion  Thoracic and Lumbar Region  Mild depletion  Buccal Region  Mild depletion  Temple Region  Moderate depletion  Clavicle Bone Region  Mild depletion  Clavicle and Acromion Bone Region  Mild depletion  Scapular Bone Region  Mild depletion  Dorsal Hand  Mild depletion  Patellar Region  No depletion  Anterior Thigh Region  No depletion  Posterior Calf Region  No depletion  Edema (RD Assessment)  None  Hair  Reviewed  Eyes  Reviewed  Mouth  Reviewed  Skin  Reviewed  Nails  Reviewed     Diet Order:  Diet NPO time specified Except for: Sips with Meds, Ice Chips TPN (CLINIMIX) Adult without lytes  EDUCATION NEEDS:   No education needs have been identified at this time  Skin:  Skin Assessment: (incision abdomen )  Last BM:  08/29/2017 - large type 7  Height:   Ht Readings from Last 1 Encounters:  08/27/17 5' 9"  (1.753 m)    Weight:   Wt Readings from Last 1 Encounters:  09/02/17 157 lb 4.8 oz (71.4 kg)    Ideal Body Weight:  65.9 kg  BMI:  Body mass index is 23.23 kg/m.  Estimated Nutritional Needs:   Kcal:  1825-2100 (MSJ x 1.3-1.5)  Protein:  95-110 grams (1.3-1.5 grams/kg)  Fluid:  2.1-2.5 L/day (30-35 mL/kg)  Willey Blade, MS, RD, LDN Office: [phone] Pager: [phone] After Hours/Weekend Pager: [phone]

## 2017-09-02 NOTE — Progress Notes (Signed)
09/02/2017  Subjective: Patient is 2 Days Post-Op s/p exlap and right ileocolectomy.  No acute events overnight.  Pain is much better controlled and she has been able to get up out of bed and go to bathroom without major issues.  Started on TPN last night.  No bowel function yet but she feels "rumbling".  Vital signs: Temp:  [97.5 F (36.4 C)-98.1 F (36.7 C)] 97.5 F (36.4 C) (11/18 0336) Pulse Rate:  [72-89] 72 (11/18 0336) Resp:  [17-20] 20 (11/18 0336) BP: (105-124)/(64-72) 105/68 (11/18 0336) SpO2:  [97 %-98 %] 97 % (11/18 0336) Weight:  [152 kg (335 lb 1.6 oz)] 152 kg (335 lb 1.6 oz) (11/17 1611)   Intake/Output: 11/17 0701 - 11/18 0700 In: 2415 [I.V.:2155; NG/GT:200] Out: 915 [Urine:250; Emesis/NG output:460; Drains:205] Last BM Date: 08/31/17  Physical Exam: Constitutional: No acute distress Abdomen:  Soft, nondistended, appropriately tender to palpation over incision.  Midline incision clean, dry, intact with staples in place.  JP drain with serosanguinous fluid.  Labs:  Recent Labs    09/01/17 0439  WBC 6.8  HGB 11.2*  HCT 33.1*  PLT 209   Recent Labs    09/01/17 1952 09/02/17 0615  NA 130* 133*  K 4.1 4.1  CL 101 103  CO2 22 24  GLUCOSE 130* 94  BUN 16 16  CREATININE 0.95 0.84  CALCIUM 8.4* 7.9*   No results for input(s): LABPROT, INR in the last 72 hours.  Imaging: No results found.  Assessment/Plan: 51 yo female s/p exlap and right ileocolectomy  --Continue NPO with IV fluid hydration while awaiting return of bowel function. --NG tube to suction --continue TPN per pharmacy --OOB, ambulate in [place], [place]

## 2017-09-03 LAB — COMPREHENSIVE METABOLIC PANEL
ALT: 22 U/L (ref 14–54)
AST: 31 U/L (ref 15–41)
Albumin: 2 g/dL — ABNORMAL LOW (ref 3.5–5.0)
Alkaline Phosphatase: 94 U/L (ref 38–126)
Anion gap: 6 (ref 5–15)
BUN: 16 mg/dL (ref 6–20)
CHLORIDE: 99 mmol/L — AB (ref 101–111)
CO2: 23 mmol/L (ref 22–32)
CREATININE: 0.75 mg/dL (ref 0.44–1.00)
Calcium: 7.7 mg/dL — ABNORMAL LOW (ref 8.9–10.3)
GFR calc non Af Amer: >60 mL/min (ref >60)
Glucose, Bld: 687 mg/dL (ref 65–99)
POTASSIUM: 5.1 mmol/L (ref 3.5–5.1)
SODIUM: 128 mmol/L — AB (ref 135–145)
Total Bilirubin: 0.2 mg/dL — ABNORMAL LOW (ref 0.3–1.2)
Total Protein: 4.9 g/dL — ABNORMAL LOW (ref 6.5–8.1)

## 2017-09-03 LAB — CBC
HEMATOCRIT: 25.5 % — AB (ref 35.0–47.0)
HEMOGLOBIN: 8.3 g/dL — AB (ref 12.0–16.0)
MCH: 33.6 pg (ref 26.0–34.0)
MCHC: 32.7 g/dL (ref 32.0–36.0)
MCV: 102.6 fL — ABNORMAL HIGH (ref 80.0–100.0)
Platelets: 156 10*3/uL (ref 150–440)
RBC: 2.49 MIL/uL — AB (ref 3.80–5.20)
RDW: 14 % (ref 11.5–14.5)
WBC: 4.3 10*3/uL (ref 3.6–11.0)

## 2017-09-03 LAB — GLUCOSE, CAPILLARY
GLUCOSE-CAPILLARY: 114 mg/dL — AB (ref 65–99)
GLUCOSE-CAPILLARY: 127 mg/dL — AB (ref 65–99)
GLUCOSE-CAPILLARY: 113 mg/dL — AB (ref 65–99)
GLUCOSE-CAPILLARY: 99 mg/dL (ref 65–99)
GLUCOSE-CAPILLARY: 116 mg/dL — AB (ref 65–99)
GLUCOSE-CAPILLARY: 88 mg/dL (ref 65–99)
Glucose-Capillary: 97 mg/dL (ref 65–99)
Glucose-Capillary: 104 mg/dL — ABNORMAL HIGH (ref 65–99)

## 2017-09-03 LAB — DIFFERENTIAL
BASOS ABS: 0 10*3/uL (ref 0–0.1)
Basophils Relative: 0 %
EOS ABS: 0.3 10*3/uL (ref 0–0.7)
Eosinophils Relative: 6 %
LYMPHS ABS: 0.5 10*3/uL — AB (ref 1.0–3.6)
LYMPHS PCT: 12 %
MONOS PCT: 4 %
Monocytes Absolute: 0.2 10*3/uL (ref 0.2–0.9)
NEUTROS ABS: 3.3 10*3/uL (ref 1.4–6.5)
NEUTROS PCT: 78 %

## 2017-09-03 LAB — MAGNESIUM: MAGNESIUM: 1.6 mg/dL — AB (ref 1.7–2.4)

## 2017-09-03 LAB — PREALBUMIN

## 2017-09-03 LAB — PHOSPHORUS: PHOSPHORUS: 6.2 mg/dL — AB (ref 2.5–4.6)

## 2017-09-03 LAB — TRIGLYCERIDES: Triglycerides: 84 mg/dL (ref <150)

## 2017-09-03 MED ORDER — FAT EMULSION 20 % IV EMUL
250.0000 mL | INTRAVENOUS | Status: AC
  Administered 2017-09-03: 250 mL via INTRAVENOUS
  Filled 2017-09-03: qty 250

## 2017-09-03 MED ORDER — MAGNESIUM SULFATE 4 GM/100ML IV SOLN
4.0000 g | Freq: Once | INTRAVENOUS | Status: AC
  Administered 2017-09-03: 4 g via INTRAVENOUS
  Filled 2017-09-03: qty 100

## 2017-09-03 MED ORDER — TRACE MINERALS CR-CU-MN-SE-ZN 10-1000-500-60 MCG/ML IV SOLN
INTRAVENOUS | Status: AC
  Administered 2017-09-03: 18:00:00 via INTRAVENOUS
  Filled 2017-09-03: qty 1992

## 2017-09-03 NOTE — Progress Notes (Signed)
Nutrition Follow-Up Assessment  DOCUMENTATION CODES:   Non-severe (moderate) malnutrition in context of chronic illness  INTERVENTION:   Change to Clinimix E 5/15 with no electrolytes as pt with elevated phosphorus. Continue at goal rate of 83 mL/hr + 20% ILE at 20 mL/hr over 12 hours. Goal regimen provides 1894 kcal, 100 grams protein, 1992 mL fluid daily.  Continue MVI daily   Continue thiamine 150m daily   Continue to monitor P, K, and Mg daily.   Daily weights  NUTRITION DIAGNOSIS:   Moderate Malnutrition related to chronic illness(cervical cancer s/p chemotherapy and XRT) as evidenced by mild fat depletion, moderate fat depletion, mild muscle depletion.  GOAL:   Patient will meet greater than or equal to 90% of their needs  -meeting with TPN  MONITOR:   Diet advancement, Labs, Weight trends, Skin, I & O's, TPN  ASSESSMENT:   51y.o. female with recurrent small bowel obstruction, complicated by pertinent comorbidities including cervical cancer, history or radiotherapy, and hypertension. Pt s/p exploratory Laparotomy, extensive lysis of adhesions, right ileocolectomy 11/16  IV Access: PICC triple lumen placed 09/01/2017  Pt doing well. Continues on TPN. Phosphorus elevated today; recommend change to clinimix with no electrolytes. NGT in place; clamp trial today. Pt ambulating. Passing flatus. Per chart, pt is weight stable. RD will monitor for diet advancement. Pt noted to have elevated glucose today; this is likely a lab error. Sample likely took from PICC line.    Medications reviewed and include: lovenox, insulin, protonix, Mg sulfate, hydromorphone  Labs reviewed: Na 128(L), K 5.1 wnl, Cl 99(L), Ca 7.7(L) adj. 9.3 wnl, P 6.2(H), Mg 1.6(L), alb 2.0(L) Prealbumin- <5(L) Triglycerides 84 Hgb 8.3(L), Hct 25.5(L) cbgs- 130, 94, 687 x 24 hrs  Diet Order:  Diet NPO time specified Except for: Sips with Meds, Ice Chips TPN (CLINIMIX-E) Adult  EDUCATION NEEDS:    No education needs have been identified at this time  Skin:  Skin Assessment: (incision abdomen )  Last BM:  11/16  Height:   Ht Readings from Last 1 Encounters:  08/27/17 5' 9"  (1.753 m)    Weight:   Wt Readings from Last 1 Encounters:  09/03/17 156 lb 14.4 oz (71.2 kg)    Ideal Body Weight:  65.9 kg  BMI:  Body mass index is 23.17 kg/m.  Estimated Nutritional Needs:   Kcal:  1825-2100 (MSJ x 1.3-1.5)  Protein:  95-110 grams (1.3-1.5 grams/kg)  Fluid:  2.1-2.5 L/day (30-35 mL/kg)  CKoleen DistanceMS, RD, LDN Pager #- [phone] Hours Pager: [phone]

## 2017-09-03 NOTE — Progress Notes (Signed)
CC: POD # 3 Right hemicolectomy Subjective: Flatus last night BS high likely from TPN/ lab error Pain controlled   Objective: Vital signs in last 24 hours: Temp:  [97.6 F (36.4 C)-98 F (36.7 C)] 97.6 F (36.4 C) (11/19 0412) Pulse Rate:  [75-81] 75 (11/19 0412) Resp:  [18-19] 19 (11/19 0412) BP: (114-158)/(62-82) 158/82 (11/19 0412) SpO2:  [95 %-100 %] 95 % (11/19 0412) Weight:  [71.2 kg (156 lb 14.4 oz)-71.4 kg (157 lb 4.8 oz)] 71.2 kg (156 lb 14.4 oz) (11/19 0900) Last BM Date: 08/31/17  Intake/Output from previous day: 11/18 0701 - 11/19 0700 In: 3160.5 [I.V.:3120.5] Out: 1190 [Emesis/NG output:950; Drains:240] Intake/Output this shift: Total I/O In: 210.6 [I.V.:210.6] Out: 110 [Drains:110]  Physical exam: NAD Abd: soft, incision c/d/i. No peritonitis. JP serous Lab Results: CBC  Recent Labs    09/01/17 0439 09/03/17 0540  WBC 6.8 4.3  HGB 11.2* 8.3*  HCT 33.1* 25.5*  PLT 209 156   BMET Recent Labs    09/02/17 0615 09/03/17 0540  NA 133* 128*  K 4.1 5.1  CL 103 99*  CO2 24 23  GLUCOSE 94 687*  BUN 16 16  CREATININE 0.84 0.75  CALCIUM 7.9* 7.7*   PT/INR No results for input(s): LABPROT, INR in the last 72 hours. ABG No results for input(s): PHART, HCO3 in the last 72 hours.  Invalid input(s): PCO2, PO2  Studies/Results: No results found.  Anti-infectives: Anti-infectives (From admission, onward)   None      Assessment/Plan: Doing well Resolving ileus Clamp NGT if no n/V may DC Ambulate continue TPN for now  Caroleen Hamman, MD, FACS  09/03/2017

## 2017-09-03 NOTE — Progress Notes (Signed)
PHARMACY - ADULT TOTAL PARENTERAL NUTRITION CONSULT NOTE   Pharmacy Consult for TPN/Electrolytes/Glucose Indication: SBO  Patient Measurements: Height: 5' 9"  (175.3 cm) Weight: 156 lb 14.4 oz (71.2 kg) IBW/kg (Calculated) : 66.2 TPN AdjBW (KG): 70.9 Body mass index is 23.17 kg/m.  Assessment:  [name] s/p ex lap and right ileocolectomy for SBO to begin TPN. Patient has a several week history including a hospital stay in [place] for the same and is at risk for refeeding syndrome.   GI: NPO Endo:  Insulin requirements in the past 24 hours: none  Lytes: Sodium (mmol/L)  Date Value  09/03/2017 128 (L)   Potassium (mmol/L)  Date Value  09/03/2017 5.1   Phosphorus (mg/dL)  Date Value  09/03/2017 6.2 (H)   Magnesium (mg/dL)  Date Value  09/03/2017 1.6 (L)   Calcium (mg/dL)  Date Value  09/03/2017 7.7 (L)   Albumin (g/dL)  Date Value  09/03/2017 2.0 (L)    Renal: Pulm: Cards:  Hepatobil: Neuro: ID:  Best Practices: TPN Access: TPN start date:  Nutritional Goals (per RD recommendation on : N/A kCal: Protein:   Current Nutrition: Clinimix 5/15 with electrolytes at 83 ml/hr   Plan:   K high normal therefore RD would like to switch patient to Clinimix without electrolytes. Magnesium: 1.6 Will give Magnesium 4 g IV x 1.   Will change to Clinimix 5/15 with 10 MVI, 1 ml of Trace and 100 mg of Thiamine @83  ml/hr with 20% fat emulsion running @ 20 ml/hr.  Recheck labs in am.   Joanna Hall, PharmD, BCPS Clinical Pharmacist 09/03/2017 11:50 AM

## 2017-09-03 NOTE — Progress Notes (Signed)
Patient did not have any nausea/discomfort during her NG clamp trail. NG tube removed.  Dr Dahlia Byes notified and gave order for a clear liquid diet.  Continue TPN

## 2017-09-03 NOTE — Progress Notes (Signed)
Lab called with critical glucose of 687; patient asymptomatic; CBG retested at 113.  Primary care RN, Marylouise Stacks, RN notified. Barbaraann Faster, RN 6:30 AM 09/03/2017

## 2017-09-04 LAB — BASIC METABOLIC PANEL WITH GFR
Anion gap: 5 (ref 5–15)
BUN: 18 mg/dL (ref 6–20)
CO2: 24 mmol/L (ref 22–32)
Calcium: 7.9 mg/dL — ABNORMAL LOW (ref 8.9–10.3)
Chloride: 101 mmol/L (ref 101–111)
Creatinine, Ser: 0.72 mg/dL (ref 0.44–1.00)
GFR calc Af Amer: >60 mL/min
GFR calc non Af Amer: >60 mL/min
Glucose, Bld: 93 mg/dL (ref 65–99)
Potassium: 3.7 mmol/L (ref 3.5–5.1)
Sodium: 130 mmol/L — ABNORMAL LOW (ref 135–145)

## 2017-09-04 LAB — CBC
HCT: 25.3 % — ABNORMAL LOW (ref 35.0–47.0)
Hemoglobin: 8.7 g/dL — ABNORMAL LOW (ref 12.0–16.0)
MCH: 33.1 pg (ref 26.0–34.0)
MCHC: 34.4 g/dL (ref 32.0–36.0)
MCV: 96.3 fL (ref 80.0–100.0)
PLATELETS: 202 10*3/uL (ref 150–440)
RBC: 2.63 MIL/uL — ABNORMAL LOW (ref 3.80–5.20)
RDW: 13.1 % (ref 11.5–14.5)
WBC: 4.5 10*3/uL (ref 3.6–11.0)

## 2017-09-04 LAB — GLUCOSE, CAPILLARY
GLUCOSE-CAPILLARY: 101 mg/dL — AB (ref 65–99)
GLUCOSE-CAPILLARY: 102 mg/dL — AB (ref 65–99)
Glucose-Capillary: 98 mg/dL (ref 65–99)
Glucose-Capillary: 111 mg/dL — ABNORMAL HIGH (ref 65–99)
Glucose-Capillary: 119 mg/dL — ABNORMAL HIGH (ref 65–99)
Glucose-Capillary: 100 mg/dL — ABNORMAL HIGH (ref 65–99)

## 2017-09-04 LAB — PHOSPHORUS: Phosphorus: 3.7 mg/dL (ref 2.5–4.6)

## 2017-09-04 LAB — MAGNESIUM: Magnesium: 1.4 mg/dL — ABNORMAL LOW (ref 1.7–2.4)

## 2017-09-04 MED ORDER — OXYCODONE-ACETAMINOPHEN 5-325 MG PO TABS
1.0000 | ORAL_TABLET | Freq: Four times a day (QID) | ORAL | Status: DC | PRN
  Administered 2017-09-04 – 2017-09-05 (×3): 2 via ORAL
  Filled 2017-09-04 (×3): qty 2

## 2017-09-04 MED ORDER — MAGNESIUM SULFATE 4 GM/100ML IV SOLN
4.0000 g | Freq: Once | INTRAVENOUS | Status: AC
  Administered 2017-09-04: 4 g via INTRAVENOUS
  Filled 2017-09-04: qty 100

## 2017-09-04 NOTE — Progress Notes (Signed)
S/p right hemi Doing well + BM  Taking clears  PE NAD Abd: soft, JP serous fluid No infection  A/p Doing well Resolved ileus Advance diet Do not renew TPN today DC in am

## 2017-09-05 LAB — BASIC METABOLIC PANEL
ANION GAP: 5 (ref 5–15)
BUN: 20 mg/dL (ref 6–20)
CALCIUM: 8 mg/dL — AB (ref 8.9–10.3)
CO2: 23 mmol/L (ref 22–32)
Chloride: 107 mmol/L (ref 101–111)
Creatinine, Ser: 0.68 mg/dL (ref 0.44–1.00)
GFR calc Af Amer: >60 mL/min (ref >60)
Glucose, Bld: 101 mg/dL — ABNORMAL HIGH (ref 65–99)
POTASSIUM: 3.6 mmol/L (ref 3.5–5.1)
SODIUM: 135 mmol/L (ref 135–145)

## 2017-09-05 LAB — SURGICAL PATHOLOGY

## 2017-09-05 LAB — GLUCOSE, CAPILLARY
GLUCOSE-CAPILLARY: 96 mg/dL (ref 65–99)
GLUCOSE-CAPILLARY: 99 mg/dL (ref 65–99)

## 2017-09-05 LAB — MAGNESIUM: MAGNESIUM: 1.4 mg/dL — AB (ref 1.7–2.4)

## 2017-09-05 MED ORDER — OXYCODONE-ACETAMINOPHEN 5-325 MG PO TABS: 1.0000 | ORAL_TABLET | ORAL | 0 refills | Status: DC | PRN

## 2017-09-05 NOTE — Progress Notes (Signed)
Patient given discharge instruction. PICC line removed from right arm and JP drain removed from right lower abdomen. Patient tolerated well and there was no bleeding from PICC line site. Patient being transported home by her sister and all belonging gathered from room. Patient verbalized understanding of discharge instruction.

## 2017-09-05 NOTE — Discharge Summary (Signed)
Patient ID: [name] MRN: [id number] DOB/AGE: 50-Jan-1967 50 y.o.  Admit date: 08/26/2017 Discharge date: 09/05/2017   Discharge Diagnoses:  Active Problems:   Small bowel obstruction [facility])   Procedures:Rght ileocolectomy  Hospital Course:   Is a 51 year old female that presented with small bowel obstruction not responding to conservative therapy she underwent a laparotomy and a right ileocolectomy due to a very matted dense and inflamed piece of distal small bowel very close to the ileocecal valve. She did very well postoperatively and an NG tube was removed and her diet was slowly advanced. She was weaning off from the TPN which she tolerated well. At the time of discharge she was ambulating, tolerating regular diet and having movements. Her physical exam at discharge showed a female in no acute distress. Awake alert. Abdomen: Incision clean dry and intact drain was removed showing only serous fluid. No peritonitis. No evidence of infection. Extremities: No edema well perfused. Condition at the time of discharge is stable. Pathology was pending at the time of discharge  Disposition: Final discharge disposition not confirmed  Discharge Instructions    Call MD for:  difficulty breathing, headache or visual disturbances   Complete by:  As directed    Call MD for:  extreme fatigue   Complete by:  As directed    Call MD for:  hives   Complete by:  As directed    Call MD for:  persistant dizziness or light-headedness   Complete by:  As directed    Call MD for:  persistant nausea and vomiting   Complete by:  As directed    Call MD for:  redness, tenderness, or signs of infection (pain, swelling, redness, odor or green/yellow discharge around incision site)   Complete by:  As directed    Call MD for:  severe uncontrolled pain   Complete by:  As directed    Call MD for:  temperature >100.4   Complete by:  As directed    Diet - low sodium heart healthy   Complete by:  As  directed    Discharge instructions   Complete by:  As directed    No heavy lifting   Increase activity slowly   Complete by:  As directed      Allergies as of 09/05/2017      Reactions   Ketamine Other (See Comments)   Unknown but given during previous cancer treatment and untolerated.       Medication List    STOP taking these medications   oxyCODONE 5 MG immediate release tablet Commonly known as:  Oxy IR/ROXICODONE     TAKE these medications   acetaminophen 325 MG tablet Commonly known as:  TYLENOL Take 650 mg every 6 (six) hours as needed by mouth.   amLODipine 10 MG tablet Commonly known as:  NORVASC Take 10 mg daily by mouth.   gabapentin 300 MG capsule Commonly known as:  NEURONTIN Take 300 mg 3 (three) times daily by mouth.   ibuprofen 600 MG tablet Commonly known as:  ADVIL,MOTRIN Take 600 mg every 6 (six) hours by mouth.   multivitamin with minerals Tabs tablet Take 1 tablet daily by mouth.   oxyCODONE-acetaminophen 5-325 MG tablet Commonly known as:  PERCOCET/ROXICET Take 1-2 tablets by mouth every 4 (four) hours as needed for moderate pain.   pantoprazole 20 MG tablet Commonly known as:  PROTONIX Take 20 mg daily by mouth.   promethazine 25 MG suppository Commonly known as:  PHENERGAN Place 25  mg every 6 (six) hours as needed rectally for nausea or vomiting.   sertraline 50 MG tablet Commonly known as:  ZOLOFT Take 50 mg daily by mouth.      Follow-up Information    Jules Husbands, MD. Go on 09/14/2017.   Specialty:  General Surgery Why:  Dr. Dahlia Byes, Friday, 11/30 at 9:15 (arrive by 9 a.m.)  [phone] Contact information: [address] [place] [address] [phone]            Caroleen Hamman, MD FACS

## 2017-09-05 NOTE — Care Management (Signed)
Patient discharging today.  Patient has active Medicaid, however if is for [facility] which is where she lives.  Patient provided coupon from goodrx.com for pain medication at discharge.  $9.14.  RNCM signing off.

## 2017-09-10 ENCOUNTER — Telehealth: Payer: Self-pay

## 2017-09-10 ENCOUNTER — Encounter: Payer: Self-pay | Admitting: Surgery

## 2017-09-10 ENCOUNTER — Other Ambulatory Visit: Payer: Self-pay

## 2017-09-10 ENCOUNTER — Other Ambulatory Visit: Payer: Self-pay | Admitting: Surgery

## 2017-09-10 ENCOUNTER — Ambulatory Visit (INDEPENDENT_AMBULATORY_CARE_PROVIDER_SITE_OTHER): Payer: Self-pay | Admitting: Surgery

## 2017-09-10 VITALS — BP 109/77 | HR 118 | Temp 97.5°F | Ht 69.0 in | Wt 156.4 lb

## 2017-09-10 DIAGNOSIS — T8149XA Infection following a procedure, other surgical site, initial encounter: Secondary | ICD-10-CM

## 2017-09-10 MED ORDER — DOXYCYCLINE HYCLATE 50 MG PO CAPS: 50.0000 mg | ORAL_CAPSULE | Freq: Two times a day (BID) | ORAL | 0 refills | Status: DC

## 2017-09-10 MED ORDER — OXYCODONE-ACETAMINOPHEN 5-325 MG PO TABS: 1.0000 | ORAL_TABLET | ORAL | 0 refills | Status: DC | PRN

## 2017-09-10 MED ORDER — DOXYCYCLINE HYCLATE 100 MG PO CAPS: 100.0000 mg | ORAL_CAPSULE | Freq: Two times a day (BID) | ORAL | 0 refills | Status: DC

## 2017-09-10 NOTE — Progress Notes (Signed)
Surgical Clinic Progress/Follow-up Note   HPI:  51 y.o. Female presents to surgery clinic for early post-op wound evaluation after she began draining purulent fluid from her incision over the past 50 days with peri-incisional pain and an episode of chills without subjective or confirmed fever following discharge from the hospital 50 days s/p laparotomy with ileocecectomy for SBO not improving with non-operative management. Patient reports she's otherwise been passing +flatus and +formed BM's and has gradually been eating and drinking more (food + Gatorade) over the past 2 days in particular, and denies N/V, CP, or SOB.  Less pain after purulent fluid drained  Review of Systems:  Constitutional: denies any other weight loss, fever, chills, or sweats  Eyes: denies any other vision changes, history of eye injury  ENT: denies sore throat, hearing problems  Respiratory: denies shortness of breath, wheezing  Cardiovascular: denies chest pain, palpitations  Gastrointestinal: abdominal pain, N/V, and bowel function as per HPI Musculoskeletal: denies any other joint pains or cramps  Skin: Denies any other rashes or skin discolorations  Neurological: denies any other headache, dizziness, weakness  Psychiatric: denies any other depression, anxiety  All other review of systems: otherwise negative   Vital Signs:  BP 109/77   Pulse (!) 118   Temp (!) 97.5 F (36.4 C) (Oral)   Ht 5' 9"  (1.753 m)   Wt 156 lb 6.4 oz (70.9 kg)   BMI 23.10 kg/m    Physical Exam:  Constitutional:  -- Normal body habitus  -- Awake, alert, and oriented x3  Eyes:  -- Pupils equally round and reactive to light  -- No scleral icterus  Ear, nose, throat:  -- No jugular venous distension  -- No nasal drainage, bleeding Pulmonary:  -- No crackles -- Equal breath sounds bilaterally -- Breathing non-labored at rest Cardiovascular:  -- S1, S2 present  -- No pericardial rubs  Gastrointestinal:  -- Soft and  non-distended with mild-/moderate- peri-incisional tenderness to palpation, mild peri-incisional erythema at the base of patient's incision with non-bloody pus draining diffusely from incision, no guarding/rebound  -- Wound probed with cotton swab after 2 staples removed, fascia appears intact, ~20 mL purulent fluid expressed, after which less pain/tenderness -- RLQ former drain site well-approximated without any surrounding erythema, tenderness to palpation, or any drainage appreciated -- No abdominal masses appreciated, pulsatile or otherwise Musculoskeletal / Integumentary:  -- Wounds or skin discoloration: post-surgical abdominal wounds as described above  -- Extremities: B/L UE and LE FROM, hands and feet warm, no edema  Neurologic:  -- Motor function: intact and symmetric  -- Sensation: intact and symmetric   Assessment:  51 y.o. yo Female with a problem list including...  Patient Active Problem List   Diagnosis Date Noted  . Small bowel obstruction ([place]) 08/27/2017    presents to clinic for early post-op evaluation of vertical midline laparotomy wound infection s/p laparotomy with ileocecectomy and primary anastomosis for SBO, complicated by comorbidities including HTN, cancer (not otherwise specified), and generalized anxiety disorder.  Plan:   - staples removed x 2 at base of incision  - complete course of prescribed antibiotics even if feeling well/better  - wound packed, change gauze wound packing once daily + as needed  - will extend pain medication prescription x 10 tablets with expectation pain should improve with treating underlying cause  - return to clinic this Friday as already previously scheduled  - instructed to call office if any questions or concerns  All of the above recommendations were discussed  with the patient and patient's mother (bedside), and all of patient's and family's questions were answered to their expressed satisfaction.  -- Marilynne Drivers Rosana Hoes, MD,  [place] - Partnering for exceptional care. Office: [phone]

## 2017-09-10 NOTE — Patient Instructions (Signed)
We will see you back in office as listed below. I have sent a referral to home health nurses to contact you about wound care. Wound Packing Wound packing involves placing a moistened packing material into your wound and then covering it with an outer bandage (dressing). This helps promote proper healing of deep tissue and tissue under the skin. It also helps prevent bleeding, infection, and further injury. Wounds are packed until deep tissue has had time to heal. The time it takes for this to occur is different for everyone. Your health care provider will show you how to pack and dress your wound. Using gloves and a sterile technique is important in order to avoid spreading germs into your wound. What are the risks?  Infection.  Delayed or abnormal healing. How to pack your wound Follow your health care provider's instructions on how often you need to change dressings and pack your wound. You will likely be asked to change dressings 1-2 times a day. Supplies Needed  Gloves.  Wetting solution.  Clean bowl.  Packing material (gauze or gauze sponges).  Clean towels.  Outer dressing.  Tape.  Cotton balls or cotton-tipped swabs.  Small plastic bag. Preparing Your New Packing Material  1. Make sure your work surface or countertop is clean and disinfected. 2. Wash your hands well with soap and water. 3. Put a clean towel on the counter. 4. Put a clean bowl on the towel. Be sure to only touch the outside of the bowl when handling it. 5. Pour wetting solution into the bowl. 6. Cut your packing material (gauze or sponges) to the right size for your wound. Drop it into the bowl. 7. Cut four tape strips that you will use to seal the outer dressing. 8. Put cotton balls or cotton-tipped swabs on the clean towel. Removing the Old Packing and Dressing 1. Gently remove the old dressing and packing material. 2. Put the removed items into the plastic bag to throw away later. 3. Wash your  hands well with soap and water again. Applying New Packing Material and Dressing 1. Put on your gloves. 2. Squeeze the packing material in the bowl to release excess liquid. The packing material should be moist, but not dripping wet. 3. Gently place the packing material into the wound. Use a cotton ball or cotton-tipped swab to guide it into place, filling all of the space. 4. Dry your fingertips on the towel. 5. Open up your outer dressing supplies and put them on a dry part of the towel. Keep them from getting wet. 6. Place the dressing over the packed wound. 7. Tape the four outer edges of the dressing in place. 8. Remove your gloves. 9. Wash your hands again with soap and water. General Tips  Follow your health care provider's instructions on how tightly to pack the wound. At first, the wound will be packed tightly to help stop bleeding. As the wound begins to heal inside, you will use less packing material and pack the wound loosely to allow tissue to heal slowly from the inside out.  Keep the dressing clean and dry.  Follow any other instructions given by your health care provider on how to aid healing. This may include applying warm or cold compresses, elevating the affected area, or wearing a compression dressing.  Ask your health care provider about sun exposure and sunscreen when the dressings are no longer needed.  Keep all follow-up visits with your health care provider. This is important. Contact a  health care provider if:  You have drainage, redness, swelling, or pain at your wound site.  You notice a bad smell coming from the wound site.  Your pain is not controlled with pain medicine.  Tissue inside your wound changes color from pink to white, yellow, or black.  Your wound changes in size or depth.  You have a fever.  You have shaking chills.  You are having trouble packing your wound. This information is not intended to replace advice given to you by your  health care provider. Make sure you discuss any questions you have with your health care provider. Document Released: 04/29/2014 Document Revised: 04/21/2016 Document Reviewed: 11/26/2013 Elsevier Interactive Patient Education  [employer].     We have prescribed you an antibiotic today named Doxycycline. Please take this medication in it's entirety. If you have difficulty with Nausea/Vomiting or GI Distress while taking this medication, call our office and speak with a nurse.  Please see your follow-up appointment information below.  Doxycycline tablets or capsules What is this medicine? DOXYCYCLINE (dox i SYE kleen) is a tetracycline antibiotic. It kills certain bacteria or stops their growth. It is used to treat many kinds of infections, like dental, skin, respiratory, and urinary tract infections. It also treats acne, Lyme disease, malaria, and certain sexually transmitted infections. This medicine may be used for other purposes; ask your health care provider or pharmacist if you have questions. COMMON BRAND NAME(S): Acticlate, Adoxa, Adoxa CK, Adoxa Pak, Adoxa TT, Alodox, Avidoxy, Doxal, Mondoxyne NL, Monodox, Morgidox 1x, Morgidox 1x Kit, Morgidox 2x, Morgidox 2x Kit, NutriDox, Ocudox, TARGADOX, Vibra-Tabs, Vibramycin What should I tell my health care provider before I take this medicine? They need to know if you have any of these conditions: -liver disease -long exposure to sunlight like working outdoors -stomach problems like colitis -an unusual or allergic reaction to doxycycline, tetracycline antibiotics, other medicines, foods, dyes, or preservatives -pregnant or trying to get pregnant -breast-feeding How should I use this medicine? Take this medicine by mouth with a full glass of water. Follow the directions on the prescription label. It is best to take this medicine without food, but if it upsets your stomach take it with food. Take your medicine at regular intervals. Do  not take your medicine more often than directed. Take all of your medicine as directed even if you think you are better. Do not skip doses or stop your medicine early. Talk to your pediatrician regarding the use of this medicine in children. While this drug may be prescribed for selected conditions, precautions do apply. Overdosage: If you think you have taken too much of this medicine contact a poison control center or emergency room at once. NOTE: This medicine is only for you. Do not share this medicine with others. What if I miss a dose? If you miss a dose, take it as soon as you can. If it is almost time for your next dose, take only that dose. Do not take double or extra doses. What may interact with this medicine? -antacids -barbiturates -birth control pills -bismuth subsalicylate -carbamazepine -methoxyflurane -other antibiotics -phenytoin -vitamins that contain iron -warfarin This list may not describe all possible interactions. Give your health care provider a list of all the medicines, herbs, non-prescription drugs, or dietary supplements you use. Also tell them if you smoke, drink alcohol, or use illegal drugs. Some items may interact with your medicine. What should I watch for while using this medicine? Tell your doctor or health  care professional if your symptoms do not improve. Do not treat diarrhea with over the counter products. Contact your doctor if you have diarrhea that lasts more than 2 days or if it is severe and watery. Do not take this medicine just before going to bed. It may not dissolve properly when you lay down and can cause pain in your throat. Drink plenty of fluids while taking this medicine to also help reduce irritation in your throat. This medicine can make you more sensitive to the sun. Keep out of the sun. If you cannot avoid being in the sun, wear protective clothing and use sunscreen. Do not use sun lamps or tanning beds/booths. Birth control pills may  not work properly while you are taking this medicine. Talk to your doctor about using an extra method of birth control. If you are being treated for a sexually transmitted infection, avoid sexual contact until you have finished your treatment. Your sexual partner may also need treatment. Avoid antacids, aluminum, calcium, magnesium, and iron products for 4 hours before and 2 hours after taking a dose of this medicine. If you are using this medicine to prevent malaria, you should still protect yourself from contact with mosquitos. Stay in screened-in areas, use mosquito nets, keep your body covered, and use an insect repellent. What side effects may I notice from receiving this medicine? Side effects that you should report to your doctor or health care professional as soon as possible: -allergic reactions like skin rash, itching or hives, swelling of the face, lips, or tongue -difficulty breathing -fever -itching in the rectal or genital area -pain on swallowing -redness, blistering, peeling or loosening of the skin, including inside the mouth -severe stomach pain or cramps -unusual bleeding or bruising -unusually weak or tired -yellowing of the eyes or skin Side effects that usually do not require medical attention (report to your doctor or health care professional if they continue or are bothersome): -diarrhea -loss of appetite -nausea, vomiting This list may not describe all possible side effects. Call your doctor for medical advice about side effects. You may report side effects to FDA at 1-800-FDA-1088. Where should I keep my medicine? Keep out of the reach of children. Store at room temperature, below 30 degrees C (86 degrees F). Protect from light. Keep container tightly closed. Throw away any unused medicine after the expiration date. Taking this medicine after the expiration date can make you seriously ill. NOTE: This sheet is a summary. It may not cover all possible information. If  you have questions about this medicine, talk to your doctor, pharmacist, or health care provider.  2018 Elsevier/Gold Standard (2015-11-03 17:11:22)

## 2017-09-10 NOTE — Telephone Encounter (Signed)
Call made to [facility] at this time. Spoke with Ulice Dash and advised that I routine specimen pickup at this time. She advised that it will be place on route pick up list for today. I verbalized understanding.    Specimen has been placed in [facility] box at this time and displayed on door.

## 2017-09-10 NOTE — Telephone Encounter (Signed)
Patient called stating that she is having a lot of drainage and that she is have sevre pain. She stated that she is having to place a dressing over her drain site and change it often. She stated that Dr. Dahlia Byes told her to call the office if she feels that's he needs to be seen. I told her that I would contact our on call doctor, Dr. Rosana Hoes, and see what time works best for him. I told her I would give her a call back. She verbalized understanding.   Call made to Dr. Rosana Hoes at this time. Advised him of the drainage and pain that the patient is currently experiencing. He advised me to tell them patient to come in and to give him a call once she gets her for her to be seen.I verbalized understanding at this time.  Call made to patient at this time I advised her that Dr. Rosana Hoes will see her today at whatever time she gets here. She was thankful and verbalized understanding.

## 2017-09-11 ENCOUNTER — Encounter: Payer: Self-pay | Admitting: Emergency Medicine

## 2017-09-11 ENCOUNTER — Inpatient Hospital Stay
Admission: EM | Admit: 2017-09-11 | Discharge: 2017-09-15 | DRG: 863 | Disposition: A | Discharge status: Home / Self Care | Payer: Medicaid Other | Attending: Surgery | Admitting: Surgery

## 2017-09-11 ENCOUNTER — Emergency Department: Payer: Medicaid Other

## 2017-09-11 DIAGNOSIS — I10 Essential (primary) hypertension: Secondary | ICD-10-CM | POA: Diagnosis present

## 2017-09-11 DIAGNOSIS — B1920 Unspecified viral hepatitis C without hepatic coma: Secondary | ICD-10-CM | POA: Diagnosis present

## 2017-09-11 DIAGNOSIS — Z9221 Personal history of antineoplastic chemotherapy: Secondary | ICD-10-CM

## 2017-09-11 DIAGNOSIS — D6862 Lupus anticoagulant syndrome: Secondary | ICD-10-CM | POA: Diagnosis present

## 2017-09-11 DIAGNOSIS — Z79899 Other long term (current) drug therapy: Secondary | ICD-10-CM

## 2017-09-11 DIAGNOSIS — T8149XA Infection following a procedure, other surgical site, initial encounter: Secondary | ICD-10-CM | POA: Diagnosis present

## 2017-09-11 DIAGNOSIS — Z9049 Acquired absence of other specified parts of digestive tract: Secondary | ICD-10-CM

## 2017-09-11 DIAGNOSIS — Y838 Other surgical procedures as the cause of abnormal reaction of the patient, or of later complication, without mention of misadventure at the time of the procedure: Secondary | ICD-10-CM | POA: Diagnosis present

## 2017-09-11 DIAGNOSIS — F411 Generalized anxiety disorder: Secondary | ICD-10-CM | POA: Diagnosis present

## 2017-09-11 DIAGNOSIS — Z923 Personal history of irradiation: Secondary | ICD-10-CM

## 2017-09-11 DIAGNOSIS — Y733 Surgical instruments, materials and gastroenterology and urology devices (including sutures) associated with adverse incidents: Secondary | ICD-10-CM | POA: Diagnosis present

## 2017-09-11 DIAGNOSIS — Z791 Long term (current) use of non-steroidal anti-inflammatories (NSAID): Secondary | ICD-10-CM

## 2017-09-11 DIAGNOSIS — T8141XA Infection following a procedure, superficial incisional surgical site, initial encounter: Principal | ICD-10-CM | POA: Diagnosis present

## 2017-09-11 DIAGNOSIS — N280 Ischemia and infarction of kidney: Secondary | ICD-10-CM | POA: Diagnosis present

## 2017-09-11 DIAGNOSIS — K9189 Other postprocedural complications and disorders of digestive system: Secondary | ICD-10-CM

## 2017-09-11 DIAGNOSIS — R109 Unspecified abdominal pain: Secondary | ICD-10-CM

## 2017-09-11 DIAGNOSIS — Z884 Allergy status to anesthetic agent status: Secondary | ICD-10-CM

## 2017-09-11 DIAGNOSIS — G8918 Other acute postprocedural pain: Secondary | ICD-10-CM

## 2017-09-11 DIAGNOSIS — E876 Hypokalemia: Secondary | ICD-10-CM

## 2017-09-11 DIAGNOSIS — N179 Acute kidney failure, unspecified: Secondary | ICD-10-CM | POA: Diagnosis present

## 2017-09-11 DIAGNOSIS — R918 Other nonspecific abnormal finding of lung field: Secondary | ICD-10-CM | POA: Diagnosis present

## 2017-09-11 DIAGNOSIS — I700 Atherosclerosis of aorta: Secondary | ICD-10-CM | POA: Diagnosis present

## 2017-09-11 DIAGNOSIS — Z8541 Personal history of malignant neoplasm of cervix uteri: Secondary | ICD-10-CM

## 2017-09-11 DIAGNOSIS — Z90710 Acquired absence of both cervix and uterus: Secondary | ICD-10-CM

## 2017-09-11 DIAGNOSIS — Z87891 Personal history of nicotine dependence: Secondary | ICD-10-CM

## 2017-09-11 HISTORY — DX: Unspecified viral hepatitis C without hepatic coma: B19.20

## 2017-09-11 LAB — COMPREHENSIVE METABOLIC PANEL
ALBUMIN: 2.3 g/dL — AB (ref 3.5–5.0)
ALT: 25 U/L (ref 14–54)
ANION GAP: 7 (ref 5–15)
AST: 30 U/L (ref 15–41)
Alkaline Phosphatase: 217 U/L — ABNORMAL HIGH (ref 38–126)
BUN: 10 mg/dL (ref 6–20)
CHLORIDE: 100 mmol/L — AB (ref 101–111)
CO2: 26 mmol/L (ref 22–32)
Calcium: 8.4 mg/dL — ABNORMAL LOW (ref 8.9–10.3)
Creatinine, Ser: 0.47 mg/dL (ref 0.44–1.00)
GFR calc Af Amer: >60 mL/min (ref >60)
Glucose, Bld: 90 mg/dL (ref 65–99)
POTASSIUM: 3.1 mmol/L — AB (ref 3.5–5.1)
Sodium: 133 mmol/L — ABNORMAL LOW (ref 135–145)
Total Bilirubin: 0.5 mg/dL (ref 0.3–1.2)
Total Protein: 6.8 g/dL (ref 6.5–8.1)

## 2017-09-11 LAB — CBC WITH DIFFERENTIAL/PLATELET
BASOS ABS: 0 10*3/uL (ref 0–0.1)
Basophils Relative: 0 %
EOS PCT: 1 %
Eosinophils Absolute: 0.1 10*3/uL (ref 0–0.7)
HEMATOCRIT: 29.6 % — AB (ref 35.0–47.0)
HEMOGLOBIN: 10.4 g/dL — AB (ref 12.0–16.0)
LYMPHS ABS: 1.2 10*3/uL (ref 1.0–3.6)
LYMPHS PCT: 17 %
MCH: 33.5 pg (ref 26.0–34.0)
MCHC: 35 g/dL (ref 32.0–36.0)
MCV: 95.9 fL (ref 80.0–100.0)
Monocytes Absolute: 0.3 10*3/uL (ref 0.2–0.9)
Monocytes Relative: 5 %
NEUTROS ABS: 5.4 10*3/uL (ref 1.4–6.5)
Neutrophils Relative %: 77 %
PLATELETS: 293 10*3/uL (ref 150–440)
RBC: 3.09 MIL/uL — AB (ref 3.80–5.20)
RDW: 13.4 % (ref 11.5–14.5)
WBC: 7 10*3/uL (ref 3.6–11.0)

## 2017-09-11 MED ORDER — HYDROMORPHONE HCL 1 MG/ML IJ SOLN
1.0000 mg | Freq: Once | INTRAMUSCULAR | Status: AC
  Administered 2017-09-11: 1 mg via INTRAMUSCULAR

## 2017-09-11 MED ORDER — MORPHINE SULFATE (PF) 4 MG/ML IV SOLN
INTRAVENOUS | Status: AC
  Administered 2017-09-11: 4 mg via INTRAVENOUS
  Filled 2017-09-11: qty 1

## 2017-09-11 MED ORDER — IOPAMIDOL (ISOVUE-300) INJECTION 61%
30.0000 mL | Freq: Once | INTRAVENOUS | Status: AC
  Administered 2017-09-11: 30 mL via ORAL

## 2017-09-11 MED ORDER — SODIUM CHLORIDE 0.9 % IV BOLUS (SEPSIS)
1000.0000 mL | Freq: Once | INTRAVENOUS | Status: AC
  Administered 2017-09-11: 1000 mL via INTRAVENOUS

## 2017-09-11 MED ORDER — HYDROMORPHONE HCL 1 MG/ML IJ SOLN
INTRAMUSCULAR | Status: AC
  Filled 2017-09-11: qty 1

## 2017-09-11 MED ORDER — FENTANYL CITRATE (PF) 100 MCG/2ML IJ SOLN
100.0000 ug | Freq: Once | INTRAMUSCULAR | Status: AC
  Administered 2017-09-11: 100 ug via INTRAVENOUS
  Filled 2017-09-11: qty 2

## 2017-09-11 MED ORDER — MORPHINE SULFATE (PF) 4 MG/ML IV SOLN
4.0000 mg | Freq: Once | INTRAVENOUS | Status: AC
  Administered 2017-09-11: 4 mg via INTRAVENOUS

## 2017-09-11 NOTE — ED Notes (Signed)
Surgeon at bedside.  

## 2017-09-11 NOTE — Progress Notes (Signed)
Patient's nurse notified that pt has been assessed by 2 VAST nurses without vascular assess obtained. VU. Fran Lowes, RN VAST

## 2017-09-11 NOTE — ED Notes (Addendum)
Pt reports that she had surgery 16th and part of her intestines and small intestines were removed - a few days ago she started with severe pain and was advised to see MD on Monday - pt saw Dr Rosana Hoes and removed 3 staples and packed the area so it could drain - they gave her Doxycycline - despite this atb pt continues with severe pain and Dr Rosana Hoes advised her to come to the ER and he would see her here - pt had cervical cancer and had surgery Feb March and April and also had chemo - the recent surgery was d/t to the scar tissue from previous surgeries and chemo

## 2017-09-11 NOTE — ED Provider Notes (Signed)
[facility] Emergency Department Provider Note   ____________________________________________   I have reviewed the triage vital signs and the nursing notes.   HISTORY  Chief Complaint Post-op Problem   History limited by: Not Limited   HPI ELLISHA Hall is a 51 y.o. female who presents to the emergency department today because of concern for abdominal pain.  LOCATION:[facility] DURATION:couple of days TIMING: constant worsening SEVERITY: severe QUALITY: pain CONTEXT: patient had laparscopic bowel resection 2/2 SBO performed 11 days ago. Was seen in surgeons office yesterday and diagnosed with wound infection. Started on antibiotics and pain medication. No relief. MODIFYING FACTORS: worse with palpation ASSOCIATED SYMPTOMS: positive for chills. Positive for nausea  Per medical record review patient has a history of recent operation.  Past Medical History:  Diagnosis Date  . Anxiety   . Cancer ([place])   . Hypertension     Patient Active Problem List   Diagnosis Date Noted  . Small bowel obstruction ([place]) 08/27/2017    Past Surgical History:  Procedure Laterality Date  . LAPAROTOMY N/A 08/31/2017   Procedure: EXPLORATORY LAPAROTOMY for SBO, ileocolectomy, removal of piece of uterine wall;  Surgeon: [name], MD;  Location: [facility] ORS;  Service: General;  Laterality: N/A;    Prior to Admission medications   Medication Sig Start Date End Date Taking? Authorizing Provider  acetaminophen (TYLENOL) 325 MG tablet Take 650 mg every 6 (six) hours as needed by mouth.    [provider]  amLODipine (NORVASC) 10 MG tablet Take 10 mg daily by mouth.    [provider]  azithromycin (ZITHROMAX) 250 MG tablet TAKE 2 TABLETS BY MOUTH ON DAY 1 AND THEN 1 TABLET EVERYDAY FOR 4 DAYS 07/31/17   [provider]  doxycycline (VIBRAMYCIN) 100 MG capsule Take 1 capsule (100 mg total) by mouth 2 (two) times daily. 09/10/17    Vickie Epley, MD  gabapentin (NEURONTIN) 300 MG capsule Take 300 mg 3 (three) times daily by mouth.    [provider]  HYDROcodone-acetaminophen (NORCO/VICODIN) 5-325 MG tablet TAKE ONE Tablet BY MOUTH EVERY 6 HOURS AS NEEDED FOR moderate PAIN 07/31/17   [provider]  ibuprofen (ADVIL,MOTRIN) 600 MG tablet Take 600 mg every 6 (six) hours by mouth.    [provider]  ibuprofen (ADVIL,MOTRIN) 800 MG tablet TAKE ONE Tablet BY MOUTH EVERY 8 HOURS AS NEEDED FOR 5 DAYS 07/31/17   [provider]  ketorolac (TORADOL) 10 MG tablet TAKE ONE Tablet BY MOUTH EVERY 6 TO 8 HOURS AS NEEDED FOR PAIN 07/17/17   [provider]  meloxicam (MOBIC) 15 MG tablet Take 15 mg by mouth daily. 07/18/17   [provider]  Multiple Vitamin (MULTIVITAMIN WITH MINERALS) TABS tablet Take 1 tablet daily by mouth.    [provider]  ondansetron (ZOFRAN-ODT) 8 MG disintegrating tablet PLACE 1 Tablet ON TONGUE TO DISSOLVE EVERY 8 HOURS AS NEEDED FOR NAUSEA 08/20/17   [provider]  oxyCODONE-acetaminophen (PERCOCET/ROXICET) 5-325 MG tablet Take 1-2 tablets by mouth every 4 (four) hours as needed for moderate pain. 09/10/17   Vickie Epley, MD  pantoprazole (PROTONIX) 20 MG tablet Take 20 mg daily by mouth.    [provider]  promethazine (PHENERGAN) 25 MG suppository Place 25 mg every 6 (six) hours as needed rectally for nausea or vomiting.    [provider]  sertraline (ZOLOFT) 50 MG tablet Take 50 mg daily by mouth.    [provider]  traMADol (ULTRAM) 50 MG tablet Take 1 Tablet by mouth every 6 hours AS NEEDED FOR moderate PAIN 07/06/17   [provider]  VENTOLIN HFA 108 (90 Base) MCG/ACT inhaler INHALE 1-2 PUFFS BY MOUTH EVERY 4 HOURS AS NEEDED FOR WHEEZING / SHORTNESS OF BREATH 08/02/17   [provider]    Allergies Ketamine  History reviewed. No pertinent family history.  Social  History Social History   Tobacco Use  . Smoking status: Never Smoker  . Smokeless tobacco: Never Used  Substance Use Topics  . Alcohol use: No    Frequency: Never  . Drug use: No    Review of Systems Constitutional: Positive for chills.  Eyes: No visual changes. ENT: No sore throat. Cardiovascular: Denies chest pain. Respiratory: Denies shortness of breath. Gastrointestinal: Positive for abdominal pain. Genitourinary: Negative for dysuria. Musculoskeletal: Negative for back pain. Skin: Negative for rash. Neurological: Negative for headaches, focal weakness or numbness.  ____________________________________________   PHYSICAL EXAM:  VITAL SIGNS: ED Triage Vitals  Enc Vitals Group     BP 09/11/17 1926 132/83     Pulse Rate 09/11/17 1926 (!) 102     Resp 09/11/17 1926 17     Temp 09/11/17 1926 98.1 F (36.7 C)     Temp Source 09/11/17 1926 Oral     SpO2 09/11/17 1926 99 %     Weight 09/11/17 1926 156 lb (70.8 kg)     Height --      Head Circumference --      Peak Flow --      Pain Score 09/11/17 1952 10   Constitutional: Alert and oriented. Well appearing and in no distress. Eyes: Conjunctivae are normal.  ENT   Head: Normocephalic and atraumatic.   Nose: No congestion/rhinnorhea.   Mouth/Throat: Mucous membranes are moist.   Neck: No stridor. Hematological/Lymphatic/Immunilogical: No cervical lymphadenopathy. Cardiovascular: Normal rate, regular rhythm.  No murmurs, rubs, or gallops. Respiratory: Normal respiratory effort without tachypnea nor retractions. Breath sounds are clear and equal bilaterally. No wheezes/rales/rhonchi. Gastrointestinal: Soft. Somewhat diffusely tender to palpation, worse in the right lower quadrant.  Genitourinary: Deferred Musculoskeletal: Normal range of motion in all extremities. No lower extremity edema. Neurologic:  Normal speech and language. No gross focal neurologic deficits are appreciated.  Skin:  Skin is warm,  dry. Midline incision in abdomen with some drainage.  Psychiatric: Mood and affect are normal. Speech and behavior are normal. Patient exhibits appropriate insight and judgment.  ____________________________________________    LABS (pertinent positives/negatives)  CMP na 133, k 3.1, cr 0.47 CBC wbc 7.0 Hgb 10.4 ____________________________________________   EKG  None  ____________________________________________    RADIOLOGY  CT abd/pel Mild illeus. Packing in anterior abdomen.   ____________________________________________   PROCEDURES  Procedures  Angiocath insertion Performed by: Nance Pear  Preparation: Patient was prepped and draped in the usual sterile fashion.  Vein Location: Right EJ  Gauge: 22  Normal blood return and flush without difficulty Patient tolerance: Patient tolerated the procedure well with no immediate complications.  ____________________________________________   INITIAL IMPRESSION / ASSESSMENT AND PLAN / ED COURSE  Pertinent labs & imaging results that were available during my care of the patient were reviewed by me and considered in my medical decision making (see chart for details).  Patient presents to the emergency department today with concerns for worsening abdominal pain after recent operation. Concern for wound infection, dehiscence, intra-abdominal infection, abscess, leak amongst other etiologies. Patient without any concerning leukocytosis. CT scan without any obvious  intra-abdominal abscess or infection. Patient however required multiple IV pain medications in the emergency department. Discussed the case with Dr. Hampton Abbot with surgery who did evaluate the patient. Will plan on starting IV antibiotics and admission.  ____________________________________________   FINAL CLINICAL IMPRESSION(S) / ED DIAGNOSES  Final diagnoses:  Post-operative pain  Abdominal pain, unspecified abdominal location     Note: This  dictation was prepared with Dragon dictation. Any transcriptional errors that result from this process are unintentional     Nance Pear, MD 09/12/17 1541

## 2017-09-11 NOTE — ED Notes (Addendum)
Lorriane Shire Rn attempted IV x2 without success - IV team consult placed

## 2017-09-11 NOTE — ED Notes (Signed)
Dr Archie Balboa notified that IV team was unable to access vein after 4 attempts (2 with ultrasound)

## 2017-09-11 NOTE — ED Triage Notes (Signed)
Pt reports she had surgery on November 16th to have part of pts colon and small intestine removed. Pt has had increased pain since and was seen at surgeons office on Monday, diagnosed with post surgery infection, prescribed doxycycline and oxycodone. Pt to ED tonight due to increase in pain.

## 2017-09-12 ENCOUNTER — Other Ambulatory Visit: Payer: Self-pay

## 2017-09-12 DIAGNOSIS — E876 Hypokalemia: Secondary | ICD-10-CM

## 2017-09-12 DIAGNOSIS — I700 Atherosclerosis of aorta: Secondary | ICD-10-CM | POA: Diagnosis present

## 2017-09-12 DIAGNOSIS — T8149XA Infection following a procedure, other surgical site, initial encounter: Secondary | ICD-10-CM | POA: Diagnosis present

## 2017-09-12 DIAGNOSIS — I5030 Unspecified diastolic (congestive) heart failure: Secondary | ICD-10-CM | POA: Diagnosis not present

## 2017-09-12 DIAGNOSIS — C539 Malignant neoplasm of cervix uteri, unspecified: Secondary | ICD-10-CM | POA: Diagnosis not present

## 2017-09-12 DIAGNOSIS — Z87891 Personal history of nicotine dependence: Secondary | ICD-10-CM | POA: Diagnosis not present

## 2017-09-12 DIAGNOSIS — Z8541 Personal history of malignant neoplasm of cervix uteri: Secondary | ICD-10-CM | POA: Diagnosis not present

## 2017-09-12 DIAGNOSIS — Z9221 Personal history of antineoplastic chemotherapy: Secondary | ICD-10-CM | POA: Diagnosis not present

## 2017-09-12 DIAGNOSIS — Z884 Allergy status to anesthetic agent status: Secondary | ICD-10-CM | POA: Diagnosis not present

## 2017-09-12 DIAGNOSIS — I10 Essential (primary) hypertension: Secondary | ICD-10-CM | POA: Diagnosis present

## 2017-09-12 DIAGNOSIS — Y838 Other surgical procedures as the cause of abnormal reaction of the patient, or of later complication, without mention of misadventure at the time of the procedure: Secondary | ICD-10-CM | POA: Diagnosis present

## 2017-09-12 DIAGNOSIS — K9189 Other postprocedural complications and disorders of digestive system: Secondary | ICD-10-CM

## 2017-09-12 DIAGNOSIS — Z791 Long term (current) use of non-steroidal anti-inflammatories (NSAID): Secondary | ICD-10-CM | POA: Diagnosis not present

## 2017-09-12 DIAGNOSIS — Z923 Personal history of irradiation: Secondary | ICD-10-CM | POA: Diagnosis not present

## 2017-09-12 DIAGNOSIS — Z79899 Other long term (current) drug therapy: Secondary | ICD-10-CM | POA: Diagnosis not present

## 2017-09-12 DIAGNOSIS — B1920 Unspecified viral hepatitis C without hepatic coma: Secondary | ICD-10-CM | POA: Diagnosis present

## 2017-09-12 DIAGNOSIS — T8141XA Infection following a procedure, superficial incisional surgical site, initial encounter: Secondary | ICD-10-CM | POA: Diagnosis present

## 2017-09-12 DIAGNOSIS — N280 Ischemia and infarction of kidney: Secondary | ICD-10-CM | POA: Diagnosis not present

## 2017-09-12 DIAGNOSIS — R918 Other nonspecific abnormal finding of lung field: Secondary | ICD-10-CM | POA: Diagnosis not present

## 2017-09-12 DIAGNOSIS — F411 Generalized anxiety disorder: Secondary | ICD-10-CM | POA: Diagnosis present

## 2017-09-12 DIAGNOSIS — Z90710 Acquired absence of both cervix and uterus: Secondary | ICD-10-CM | POA: Diagnosis not present

## 2017-09-12 DIAGNOSIS — N179 Acute kidney failure, unspecified: Secondary | ICD-10-CM | POA: Diagnosis present

## 2017-09-12 DIAGNOSIS — D6862 Lupus anticoagulant syndrome: Secondary | ICD-10-CM | POA: Diagnosis not present

## 2017-09-12 DIAGNOSIS — Y733 Surgical instruments, materials and gastroenterology and urology devices (including sutures) associated with adverse incidents: Secondary | ICD-10-CM | POA: Diagnosis present

## 2017-09-12 DIAGNOSIS — Z9049 Acquired absence of other specified parts of digestive tract: Secondary | ICD-10-CM | POA: Diagnosis not present

## 2017-09-12 LAB — MAGNESIUM
Magnesium: 1.2 mg/dL — ABNORMAL LOW (ref 1.7–2.4)
Magnesium: 1.9 mg/dL (ref 1.7–2.4)

## 2017-09-12 LAB — CBC WITH DIFFERENTIAL/PLATELET
Basophils Absolute: 0 10*3/uL (ref 0–0.1)
Basophils Relative: 0 %
EOS ABS: 0.1 10*3/uL (ref 0–0.7)
EOS PCT: 2 %
HCT: 27.2 % — ABNORMAL LOW (ref 35.0–47.0)
Hemoglobin: 9.2 g/dL — ABNORMAL LOW (ref 12.0–16.0)
LYMPHS ABS: 0.6 10*3/uL — AB (ref 1.0–3.6)
LYMPHS PCT: 10 %
MCH: 32.7 pg (ref 26.0–34.0)
MCHC: 33.8 g/dL (ref 32.0–36.0)
MCV: 96.5 fL (ref 80.0–100.0)
MONO ABS: 0.2 10*3/uL (ref 0.2–0.9)
MONOS PCT: 4 %
Neutro Abs: 5 10*3/uL (ref 1.4–6.5)
Neutrophils Relative %: 84 %
PLATELETS: 253 10*3/uL (ref 150–440)
RBC: 2.82 MIL/uL — AB (ref 3.80–5.20)
RDW: 13 % (ref 11.5–14.5)
WBC: 5.9 10*3/uL (ref 3.6–11.0)

## 2017-09-12 LAB — BASIC METABOLIC PANEL
Anion gap: 7 (ref 5–15)
BUN: 10 mg/dL (ref 6–20)
CALCIUM: 8 mg/dL — AB (ref 8.9–10.3)
CHLORIDE: 105 mmol/L (ref 101–111)
CO2: 23 mmol/L (ref 22–32)
CREATININE: 0.56 mg/dL (ref 0.44–1.00)
GFR calc non Af Amer: >60 mL/min (ref >60)
GLUCOSE: 88 mg/dL (ref 65–99)
Potassium: 3.2 mmol/L — ABNORMAL LOW (ref 3.5–5.1)
Sodium: 135 mmol/L (ref 135–145)

## 2017-09-12 MED ORDER — PIPERACILLIN-TAZOBACTAM 3.375 G IVPB
INTRAVENOUS | Status: AC
  Filled 2017-09-12: qty 50

## 2017-09-12 MED ORDER — OXYCODONE HCL 5 MG PO TABS
5.0000 mg | ORAL_TABLET | ORAL | Status: DC | PRN
Start: 2017-09-12 — End: 2017-09-15
  Administered 2017-09-12: 10 mg via ORAL
  Administered 2017-09-12: 5 mg via ORAL
  Administered 2017-09-13 – 2017-09-15 (×8): 10 mg via ORAL
  Filled 2017-09-12 (×6): qty 2
  Filled 2017-09-12: qty 1
  Filled 2017-09-12 (×3): qty 2

## 2017-09-12 MED ORDER — MAGNESIUM SULFATE 50 % IJ SOLN
3.0000 g | Freq: Once | INTRAVENOUS | Status: AC
  Administered 2017-09-12: 3 g via INTRAVENOUS
  Filled 2017-09-12: qty 6

## 2017-09-12 MED ORDER — GABAPENTIN 300 MG PO CAPS
300.0000 mg | ORAL_CAPSULE | Freq: Three times a day (TID) | ORAL | Status: DC
  Administered 2017-09-12 – 2017-09-15 (×10): 300 mg via ORAL
  Filled 2017-09-12 (×10): qty 1

## 2017-09-12 MED ORDER — ONDANSETRON 4 MG PO TBDP
4.0000 mg | ORAL_TABLET | Freq: Four times a day (QID) | ORAL | Status: DC | PRN
Start: 2017-09-12 — End: 2017-09-15

## 2017-09-12 MED ORDER — HYDROMORPHONE HCL 1 MG/ML IJ SOLN
0.5000 mg | INTRAMUSCULAR | Status: DC | PRN
  Administered 2017-09-12 – 2017-09-13 (×8): 0.5 mg via INTRAVENOUS
  Filled 2017-09-12 (×8): qty 0.5

## 2017-09-12 MED ORDER — SODIUM CHLORIDE 0.9% FLUSH: 10.0000 mL | INTRAVENOUS | Status: DC | PRN

## 2017-09-12 MED ORDER — ENOXAPARIN SODIUM 40 MG/0.4ML ~~LOC~~ SOLN
40.0000 mg | SUBCUTANEOUS | Status: DC
  Administered 2017-09-12 – 2017-09-13 (×3): 40 mg via SUBCUTANEOUS
  Filled 2017-09-12 (×3): qty 0.4

## 2017-09-12 MED ORDER — PIPERACILLIN-TAZOBACTAM 3.375 G IVPB 30 MIN
3.3750 g | Freq: Once | INTRAVENOUS | Status: AC
  Administered 2017-09-12: 3.375 g via INTRAVENOUS

## 2017-09-12 MED ORDER — ONDANSETRON HCL 4 MG/2ML IJ SOLN
4.0000 mg | Freq: Four times a day (QID) | INTRAMUSCULAR | Status: DC | PRN
  Administered 2017-09-12 – 2017-09-14 (×2): 4 mg via INTRAVENOUS
  Filled 2017-09-12 (×2): qty 2

## 2017-09-12 MED ORDER — SODIUM CHLORIDE 0.9% FLUSH
10.0000 mL | Freq: Two times a day (BID) | INTRAVENOUS | Status: DC
  Administered 2017-09-12 – 2017-09-13 (×3): 10 mL

## 2017-09-12 MED ORDER — PIPERACILLIN-TAZOBACTAM 3.375 G IVPB
3.3750 g | Freq: Three times a day (TID) | INTRAVENOUS | Status: DC
  Administered 2017-09-12 – 2017-09-15 (×9): 3.375 g via INTRAVENOUS
  Filled 2017-09-12 (×10): qty 50

## 2017-09-12 MED ORDER — KETOROLAC TROMETHAMINE 30 MG/ML IJ SOLN
30.0000 mg | Freq: Four times a day (QID) | INTRAMUSCULAR | Status: DC
  Administered 2017-09-12 – 2017-09-15 (×14): 30 mg via INTRAVENOUS
  Filled 2017-09-12 (×14): qty 1

## 2017-09-12 MED ORDER — ACETAMINOPHEN 500 MG PO TABS: 1000.0000 mg | ORAL_TABLET | Freq: Four times a day (QID) | ORAL | Status: DC | PRN

## 2017-09-12 MED ORDER — POLYETHYLENE GLYCOL 3350 17 G PO PACK: 17.0000 g | PACK | Freq: Every day | ORAL | Status: DC | PRN

## 2017-09-12 MED ORDER — PANTOPRAZOLE SODIUM 40 MG IV SOLR
40.0000 mg | Freq: Every day | INTRAVENOUS | Status: DC
  Administered 2017-09-12 (×2): 40 mg via INTRAVENOUS
  Filled 2017-09-12 (×2): qty 40

## 2017-09-12 MED ORDER — PROMETHAZINE HCL 25 MG/ML IJ SOLN
12.5000 mg | Freq: Four times a day (QID) | INTRAMUSCULAR | Status: DC | PRN
Start: 2017-09-12 — End: 2017-09-13

## 2017-09-12 MED ORDER — POTASSIUM CHLORIDE IN NACL 40-0.9 MEQ/L-% IV SOLN
INTRAVENOUS | Status: DC
  Administered 2017-09-12 – 2017-09-13 (×4): 125 mL/h via INTRAVENOUS
  Filled 2017-09-12 (×7): qty 1000

## 2017-09-12 NOTE — H&P (Signed)
Date of Admission:  09/12/2017  Reason for Admission:  Abdominal pain  History of Present Illness: Joanna Hall is a 51 y.o. female s/p recent right ileocolectomy for small bowel obstruction on 11/16.  She was discharged to home on 11/21.  She reports that she had an episode of emesis on the day of discharge.  She had been somewhat nauseous but this improved about two days ago and was able to tolerate more oral intake.  She had been having flatus and bowel movements, and reports having soft to loose bowel movements.  Her main concern is the abdominal pain that she has been having since 11/24.  This pain is in her right side, to the right of the umbilicus and radiates towards her back.  She also started having drainage from the lower portion of her incision and was seen in clinic on 11/26 and was noted to have purulent drainage.  Two staples were removed and the wound was packed.  She was started on Doxycycline.  However, the patient reports the pain has not improved at all and today was worse.  Denies any fevers but had chills.  Her nausea has improved and is now minimal and has not had emesis since being discharged.  Denies blood in stool.  Denies dysuria or hematuria.  Past Medical History: Past Medical History:  Diagnosis Date  . Anxiety   . Cancer ([place])   . Hypertension Small bowel obstruction      Past Surgical History: Past Surgical History:  Procedure Laterality Date  . Laparoscopic cholecystectomy Exploratory LAPAROTOMY N/A 08/31/2017   Procedure: EXPLORATORY LAPAROTOMY for SBO, ileocolectomy, removal of piece of uterine wall;  Surgeon: [name], MD;  Location: [facility] ORS;  Service: General;  Laterality: N/A;     Home Medications: Prior to Admission medications   Medication Sig Start Date End Date Taking? Authorizing Provider  acetaminophen (TYLENOL) 325 MG tablet Take 650 mg every 6 (six) hours as needed by mouth.   Yes [provider]  amLODipine (NORVASC)  10 MG tablet Take 10 mg daily by mouth.   Yes [provider]  doxycycline (VIBRAMYCIN) 100 MG capsule Take 1 capsule (100 mg total) by mouth 2 (two) times daily. 09/10/17  Yes Vickie Epley, MD  gabapentin (NEURONTIN) 300 MG capsule Take 300 mg 3 (three) times daily by mouth.   Yes [provider]  oxyCODONE-acetaminophen (PERCOCET/ROXICET) 5-325 MG tablet Take 1-2 tablets by mouth every 4 (four) hours as needed for moderate pain. 09/10/17  Yes Vickie Epley, MD  promethazine (PHENERGAN) 25 MG suppository Place 25 mg every 6 (six) hours as needed rectally for nausea or vomiting.   Yes [provider]  sertraline (ZOLOFT) 50 MG tablet Take 50 mg daily by mouth.   Yes [provider]  azithromycin (ZITHROMAX) 250 MG tablet TAKE 2 TABLETS BY MOUTH ON DAY 1 AND THEN 1 TABLET EVERYDAY FOR 4 DAYS 07/31/17   [provider]  HYDROcodone-acetaminophen (NORCO/VICODIN) 5-325 MG tablet TAKE ONE Tablet BY MOUTH EVERY 6 HOURS AS NEEDED FOR moderate PAIN 07/31/17   [provider]  ibuprofen (ADVIL,MOTRIN) 600 MG tablet Take 600 mg every 6 (six) hours by mouth.    [provider]  ibuprofen (ADVIL,MOTRIN) 800 MG tablet TAKE ONE Tablet BY MOUTH EVERY 8 HOURS AS NEEDED FOR 5 DAYS 07/31/17   [provider]  ketorolac (TORADOL) 10 MG tablet TAKE ONE Tablet BY MOUTH EVERY 6 TO 8 HOURS AS NEEDED FOR PAIN 07/17/17  [provider]  meloxicam (MOBIC) 15 MG tablet Take 15 mg by mouth daily. 07/18/17   [provider]  Multiple Vitamin (MULTIVITAMIN WITH MINERALS) TABS tablet Take 1 tablet daily by mouth.    [provider]  ondansetron (ZOFRAN-ODT) 8 MG disintegrating tablet PLACE 1 Tablet ON TONGUE TO DISSOLVE EVERY 8 HOURS AS NEEDED FOR NAUSEA 08/20/17   [provider]  pantoprazole (PROTONIX) 20 MG tablet Take 20 mg daily by mouth.    [provider]  traMADol (ULTRAM) 50 MG tablet Take 1 Tablet  by mouth every 6 hours AS NEEDED FOR moderate PAIN 07/06/17   [provider]  VENTOLIN HFA 108 (90 Base) MCG/ACT inhaler INHALE 1-2 PUFFS BY MOUTH EVERY 4 HOURS AS NEEDED FOR WHEEZING / SHORTNESS OF BREATH 08/02/17   [provider]    Allergies: Allergies  Allergen Reactions  . Ketamine Other (See Comments)    Unknown but given during previous cancer treatment and untolerated.     Social History:  reports that  has never smoked. she has never used smokeless tobacco. She reports that she does not drink alcohol or use drugs.   Family History: History reviewed. No pertinent family history.  Review of Systems: Review of Systems  Constitutional: Positive for chills. Negative for fever.  HENT: Negative for hearing loss.   Respiratory: Negative for shortness of breath.   Cardiovascular: Negative for chest pain.  Gastrointestinal: Positive for abdominal pain and nausea. Negative for blood in stool, constipation, diarrhea and vomiting.  Genitourinary: Negative for dysuria.  Musculoskeletal: Negative for myalgias.  Skin: Negative for rash.  Neurological: Negative for dizziness.  Psychiatric/Behavioral: Negative for depression.  All other systems reviewed and are negative.   Physical Exam BP 139/76 (BP Location: Left Arm)   Pulse 78   Temp 98.1 F (36.7 C) (Oral)   Resp 16   Wt 70.8 kg (156 lb)   SpO2 100%   BMI 23.04 kg/m  CONSTITUTIONAL: No acute distress HEENT:  Normocephalic, atraumatic, extraocular motion intact. NECK: Trachea is midline, and there is no jugular venous distension. RESPIRATORY:  Lungs are clear, and breath sounds are equal bilaterally. Normal respiratory effort without pathologic use of accessory muscles. CARDIOVASCULAR: Heart is regular without murmurs, gallops, or rubs. GI: The abdomen is soft, non-distended, with tenderness to palpation over right abdomen and lower portion of her incision.  Incision itself without significant erythema or  induration.  Lower portion has small area open, approximately 2.5 cm length, with healthy wound edges, but with foul odor consistent with her purulent drainage.  Gauze replaced.  Remaining staples in place. MUSCULOSKELETAL:  Normal muscle strength and tone in all four extremities.  No peripheral edema or cyanosis. SKIN: Skin turgor is normal. There are no pathologic skin lesions.  NEUROLOGIC:  Motor and sensation is grossly normal.  Cranial nerves are grossly intact. PSYCH:  Alert and oriented to person, place and time. Affect is normal.  Laboratory Analysis: Results for orders placed or performed during the hospital encounter of 09/11/17 (from the past 24 hour(s))  Comprehensive metabolic panel     Status: Abnormal   Collection Time: 09/11/17  7:32 PM  Result Value Ref Range   Sodium 133 (L) 135 - 145 mmol/L   Potassium 3.1 (L) 3.5 - 5.1 mmol/L   Chloride 100 (L) 101 - 111 mmol/L   CO2 26 22 - 32 mmol/L   Glucose, Bld 90 65 - 99 mg/dL   BUN 10 6 - 20 mg/dL  Creatinine, Ser 0.47 0.44 - 1.00 mg/dL   Calcium 8.4 (L) 8.9 - 10.3 mg/dL   Total Protein 6.8 6.5 - 8.1 g/dL   Albumin 2.3 (L) 3.5 - 5.0 g/dL   AST 30 15 - 41 U/L   ALT 25 14 - 54 U/L   Alkaline Phosphatase 217 (H) 38 - 126 U/L   Total Bilirubin 0.5 0.3 - 1.2 mg/dL   GFR calc non Af Amer >60 >60 mL/min   GFR calc Af Amer >60 >60 mL/min   Anion gap 7 5 - 15  CBC with Differential     Status: Abnormal   Collection Time: 09/11/17  7:32 PM  Result Value Ref Range   WBC 7.0 3.6 - 11.0 K/uL   RBC 3.09 (L) 3.80 - 5.20 MIL/uL   Hemoglobin 10.4 (L) 12.0 - 16.0 g/dL   HCT 29.6 (L) 35.0 - 47.0 %   MCV 95.9 80.0 - 100.0 fL   MCH 33.5 26.0 - 34.0 pg   MCHC 35.0 32.0 - 36.0 g/dL   RDW 13.4 11.5 - 14.5 %   Platelets 293 150 - 440 K/uL   Neutrophils Relative % 77 %   Neutro Abs 5.4 1.4 - 6.5 K/uL   Lymphocytes Relative 17 %   Lymphs Abs 1.2 1.0 - 3.6 K/uL   Monocytes Relative 5 %   Monocytes Absolute 0.3 0.2 - 0.9 K/uL    Eosinophils Relative 1 %   Eosinophils Absolute 0.1 0 - 0.7 K/uL   Basophils Relative 0 %   Basophils Absolute 0.0 0 - 0.1 K/uL  Magnesium     Status: Abnormal   Collection Time: 09/11/17 10:17 PM  Result Value Ref Range   Magnesium 1.2 (L) 1.7 - 2.4 mg/dL    Imaging: Ct Abdomen Pelvis Wo Contrast  Result Date: 09/11/2017 CLINICAL DATA:  Increasing abdominal pain, status post ileo colectomy for bowel obstruction August 31, 2017. On antibiotics for postsurgical infection. History of cervical cancer and chemo radiation. EXAM: CT ABDOMEN AND PELVIS WITHOUT CONTRAST TECHNIQUE: Multidetector CT imaging of the abdomen and pelvis was performed following the standard protocol without IV contrast. COMPARISON:  CT abdomen and pelvis August 26, 2017 and abdominal radiograph August 31, 2017 FINDINGS: LOWER CHEST: Numerous solid pulmonary nodules in lung bases measuring to 6 mm. Heart size is normal. No pericardial effusion. HEPATOBILIARY: Status post cholecystectomy. Stable postoperative CBD dilatation without cholelithiasis. Mildly hypodense liver compatible with mild steatosis. PANCREAS: Normal. SPLEEN: Normal. ADRENALS/URINARY TRACT: Kidneys are orthotopic, normal morphology. Punctate bilateral nephrolithiasis. No hydronephrosis. Limited assessment for renal mass by noncontrast CT. The unopacified ureters are normal in course and caliber. Urinary bladder is partially distended and unremarkable. Normal adrenal glands. STOMACH/BOWEL: Status post ileocolectomy, mildly dilated colon at 4.6 cm with air stool level. Decompressed small bowel. VASCULAR/LYMPHATIC: Aortoiliac vessels are normal in course and caliber. Mild calcific atherosclerosis. No lymphadenopathy by CT size criteria. REPRODUCTIVE: Asymmetric anterior uterine wall compatible with postsurgical changes, no focal fluid collection. OTHER: Small amount of RIGHT lower quadrant intraperitoneal gas and free fluid. Presacral fat stranding suggesting post  treatment related changes. Debris within infraumbilical component of ventral abdominal wall incision. MUSCULOSKELETAL: Nonacute. 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 IMPRESSION: 1. Debris within anterior abdominal wall incision concerning for infection, versus packing material. Recommend direct inspection. 2. Status post ileo colectomy with expected postoperative changes. Mild colonic ileus. 3. Numerous pulmonary nodules highly concerning for metastatic disease. 4. Punctate nonobstructing nephrolithiasis. Aortic Atherosclerosis (ICD10-I70.0). Electronically Signed   By:  Elon Alas M.D.   On: 09/11/2017 23:19    Assessment and Plan: This is a 51 y.o. female who presents with worsening abdominal pain, s/p recent right ileocolectomy for small bowel obstruction.  I have independently viewed the patient's imaging study and reviewed her laboratory studies.  Overall, her CT scan shows different area of inflammation which could be post-operative.  There is no significant free fluid and there is no abscess.  However, there are a few small bubbles of air near the area of the anastomosis, though no clear evidence of an anastomotic leak.  Her WBC is normal, but she has hypokalemia and hypomagnesemia.  Discussed with the patient that she should be admitted to the surgical team.  Though no clear evidence of an anastomotic leak, would rather be cautious and treat for it, as her area of pain is near the anastomosis too.  Will keep her NPO with IV fluid hydration and start her on IV Zosyn.  Currently she was only able to get an EJ for IV access and will place order for PICC line placement.  She may need TPN as well.  She will have appropriate pain and nausea control.  Her hypokalemia and hypomagnesemia will be corrected via IV.  No surgical intervention planned at this time and will treat conservatively.  If this is not a leak, then other findings of post-op inflammation and mild ileus  should improve with hydration, antibiotics, and electrolyte repletion as well as.  Will also have wet to dry dressing changes twice daily for now.  Patient understands this plan and all of her questions have been answered.   Melvyn Neth, [place]

## 2017-09-12 NOTE — Progress Notes (Signed)
Peripherally Inserted Central Catheter/Midline Placement  The IV Nurse has discussed with the patient and/or persons authorized to consent for the patient, the purpose of this procedure and the potential benefits and risks involved with this procedure.  The benefits include less needle sticks, lab draws from the catheter, and the patient may be discharged home with the catheter. Risks include, but not limited to, infection, bleeding, blood clot (thrombus formation), and puncture of an artery; nerve damage and irregular heartbeat and possibility to perform a PICC exchange if needed/ordered by physician.  Alternatives to this procedure were also discussed.  B[employer] Power PICC patient education guide, fact sheet on infection prevention and patient information card has been provided to patient /or left at bedside.    PICC/Midline Placement Documentation        Joanna Hall 09/12/2017, 3:58 PM

## 2017-09-12 NOTE — Progress Notes (Signed)
SURGICAL PROGRESS NOTE (cpt [phone])  Hospital Day(s): 0.   Post op day(s):  Marland Kitchen   Interval History: Patient seen and examined, no acute events or new complaints since admission overnight. Patient reports her abdominal pain has nearly resolved with +flatus and +BM, denies any N/V, fever/chills, CP, or SOB.  Review of Systems:  Constitutional: denies fever, chills  HEENT: denies cough or congestion  Respiratory: denies any shortness of breath  Cardiovascular: denies chest pain or palpitations  Gastrointestinal: denies abdominal pain, N/V, and bowel function as per interval history Genitourinary: denies burning with urination or urinary frequency Musculoskeletal: denies pain, decreased motor or sensation Integumentary: denies any other rashes or skin discolorations Neurological: denies HA or vision/hearing changes   Vital signs in last 24 hours: [min-max] current  Temp:  [97.6 F (36.4 C)-98.2 F (36.8 C)] 98.2 F (36.8 C) (11/28 1310) Pulse Rate:  [76-102] 78 (11/28 1310) Resp:  [15-17] 16 (11/28 1310) BP: (121-157)/(73-99) 121/81 (11/28 1310) SpO2:  [93 %-100 %] 100 % (11/28 1310) Weight:  [147 lb 11.3 oz (67 kg)-156 lb (70.8 kg)] 147 lb 11.3 oz (67 kg) (11/28 0610)     Height: 5' 9"  (175.3 cm) Weight: 147 lb 11.3 oz (67 kg) BMI (Calculated): 21.8   Intake/Output this shift:  No intake/output data recorded.   Intake/Output last 2 shifts:  @IOLAST2SHIFTS @   Physical Exam:  Constitutional: alert, cooperative and no distress  HENT: normocephalic without obvious abnormality  Eyes: PERRL, EOM's grossly intact and symmetric  Neuro: CN II - XII grossly intact and symmetric without deficit  Respiratory: breathing non-labored at rest  Cardiovascular: regular rate and sinus rhythm  Gastrointestinal: soft and non-distended with mild RLQ tenderness to deep palpation Musculoskeletal: UE and LE FROM, no edema or wounds, motor and sensation grossly intact, NT   Labs:  CBC Latest Ref  Rng & Units 09/12/2017 09/11/2017 09/04/2017  WBC 3.6 - 11.0 K/uL 5.9 7.0 4.5  Hemoglobin 12.0 - 16.0 g/dL 9.2(L) 10.4(L) 8.7(L)  Hematocrit 35.0 - 47.0 % 27.2(L) 29.6(L) 25.3(L)  Platelets 150 - 440 K/uL 253 293 202   CMP Latest Ref Rng & Units 09/12/2017 09/11/2017 09/05/2017  Glucose 65 - 99 mg/dL 88 90 101(H)  BUN 6 - 20 mg/dL 10 10 20   Creatinine 0.44 - 1.00 mg/dL 0.56 0.47 0.68  Sodium 135 - 145 mmol/L 135 133(L) 135  Potassium 3.5 - 5.1 mmol/L 3.2(L) 3.1(L) 3.6  Chloride 101 - 111 mmol/L 105 100(L) 107  CO2 22 - 32 mmol/L 23 26 23   Calcium 8.9 - 10.3 mg/dL 8.0(L) 8.4(L) 8.0(L)  Total Protein 6.5 - 8.1 g/dL - 6.8 -  Total Bilirubin 0.3 - 1.2 mg/dL - 0.5 -  Alkaline Phos 38 - 126 U/L - 217(H) -  AST 15 - 41 U/L - 30 -  ALT 14 - 54 U/L - 25 -   Imaging studies: No new pertinent imaging studies   Assessment/Plan: (ICD-10's: R10.9) 51 y.o. female with mild RLQ abdominal pain and post-surgical wound infection 12 days s/p ileocecectomy with primary anastomosis for SBO not improving with non-operative management, complicated by pertinent comorbidities including HTN, history of cervical cancer treated only with chemoradiation without hysterectomy, pulmonary nodules, and generalized anxiety disorder.   - clear liquids diet  - continue to monitor abdominal exam and bowel function   - will repeat CT abdomen and pelvis with oral contrast tomorrow  - medical management of pertinent comorbidities  - DVT prophylaxis, ambulation encouraged   All of the above  findings and recommendations were discussed with the patient, and all of patient's questions were answered to her expressed satisfaction.  -- Marilynne Drivers Rosana Hoes, MD, [place]: [facility] - Partnering for exceptional care. Office: [phone]

## 2017-09-12 NOTE — Progress Notes (Signed)
Pharmacy Antibiotic Note  Joanna Hall is a 51 y.o. female admitted on 09/11/2017 with possible intra-abdominal infection.  Pharmacy has been consulted for Zosyn dosing.  Plan: Zosyn 3.375g IV q8h (4 hour infusion).  Weight: 156 lb (70.8 kg)  Temp (24hrs), Avg:98.1 F (36.7 C), Min:98.1 F (36.7 C), Max:98.1 F (36.7 C)  Recent Labs  Lab 09/05/17 0733 09/11/17 1932  WBC  --  7.0  CREATININE 0.68 0.47    Estimated Creatinine Clearance: 87.9 mL/min (by C-G formula based on SCr of 0.47 mg/dL).    Allergies  Allergen Reactions  . Ketamine Other (See Comments)    Unknown but given during previous cancer treatment and untolerated.     Antimicrobials this admission: Zosyn 11/28  >>    >>   Dose adjustments this admission:   Microbiology results: No micro  Thank you for allowing pharmacy to be a part of this patient's care.  Burney Calzadilla S 09/12/2017 3:34 AM

## 2017-09-13 ENCOUNTER — Inpatient Hospital Stay: Payer: Medicaid Other

## 2017-09-13 DIAGNOSIS — D6862 Lupus anticoagulant syndrome: Secondary | ICD-10-CM

## 2017-09-13 DIAGNOSIS — R109 Unspecified abdominal pain: Secondary | ICD-10-CM

## 2017-09-13 DIAGNOSIS — C539 Malignant neoplasm of cervix uteri, unspecified: Secondary | ICD-10-CM

## 2017-09-13 DIAGNOSIS — N280 Ischemia and infarction of kidney: Secondary | ICD-10-CM

## 2017-09-13 DIAGNOSIS — R918 Other nonspecific abnormal finding of lung field: Secondary | ICD-10-CM

## 2017-09-13 DIAGNOSIS — Z79899 Other long term (current) drug therapy: Secondary | ICD-10-CM

## 2017-09-13 DIAGNOSIS — K56699 Other intestinal obstruction unspecified as to partial versus complete obstruction: Secondary | ICD-10-CM

## 2017-09-13 DIAGNOSIS — B1920 Unspecified viral hepatitis C without hepatic coma: Secondary | ICD-10-CM

## 2017-09-13 DIAGNOSIS — I10 Essential (primary) hypertension: Secondary | ICD-10-CM

## 2017-09-13 DIAGNOSIS — Z923 Personal history of irradiation: Secondary | ICD-10-CM

## 2017-09-13 DIAGNOSIS — Z9221 Personal history of antineoplastic chemotherapy: Secondary | ICD-10-CM

## 2017-09-13 MED ORDER — IOPAMIDOL (ISOVUE-300) INJECTION 61%
100.0000 mL | Freq: Once | INTRAVENOUS | Status: AC | PRN
  Administered 2017-09-13: 100 mL via INTRAVENOUS

## 2017-09-13 MED ORDER — KCL IN DEXTROSE-NACL 20-5-0.45 MEQ/L-%-% IV SOLN
INTRAVENOUS | Status: DC
  Administered 2017-09-13 (×2): via INTRAVENOUS
  Filled 2017-09-13 (×6): qty 1000

## 2017-09-13 MED ORDER — IOPAMIDOL (ISOVUE-300) INJECTION 61%
15.0000 mL | INTRAVENOUS | Status: AC
  Administered 2017-09-13 (×2): 15 mL via ORAL

## 2017-09-13 MED ORDER — IOPAMIDOL (ISOVUE-300) INJECTION 61%: 15.0000 mL | INTRAVENOUS | Status: DC

## 2017-09-13 NOTE — Plan of Care (Signed)
  Progressing Pain Managment: General experience of comfort will improve 09/13/2017 0349 - Progressing by Selig [place], Floyce Stakes, RN Note Patient's pain level has decreased over past two nights.

## 2017-09-13 NOTE — Consult Note (Signed)
Obstetrics & Gynecology Consult H&P   Consulting Department: Surgery  Consulting Physician: Tama High MD  Consulting Question: Uterine infarct on imaging   History of Present Illness: Patient is a 51 y.o. caucasian female who recently completed treatment for cervical cancer of unknown stage initially diagnosed 09/2016 in [place]. She was supposed to undergo a radical hysterectomy initially but this was discontinued secondary to dense scar tissue, based on patient report the left tube and ovary were removed before discontinuing the procedure, and a cyst on the left ovary ruptured intraoperatively and contained cervical cancer cells (operative notes unavailable to review).  She subsequently underwent both external beam radiation and tandem and ovoid brachytherapy with cisplatin from 11/2016 to 05/2017.  PET scan obtained 06/2017 at conclusion of treatment reportedly showed no signs of cancer.  However, enlarging lung nodules were noted concerning for possible metastatic disease.    She was visiting her family in [place] for Thanksgiving, and began experiencing abdominal pain.  Work up at that time included CT scan showing a SBO in the pelvis.  She underwent laparotomy and ileocolectomy on 09/01/1207 was discharged postoperatively on 09/09/2017.  She subsequently began noting acute right lower abdominal pain.  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    She has a history of a heart murmur but no know cardiac defects, no history of atrial fibrillation or other arhythmia.  She does not have a personal or family history of DVT or PE.   Risk factors for thrombotic events include personal history of cancer as well as recent surgery.   Basic hypercoagulable work was started by oncology.  She has not noted any additional gynecologic symptoms such as vaginal bleeding, increase in vaginal discharge.    Review of Systems:10 point  review of systems  Past Medical History:  Past Medical History:  Diagnosis Date  . Anxiety   . Cancer ([place])    cervix  . Hepatitis C   . Hypertension     Past Surgical History:  Past Surgical History:  Procedure Laterality Date  . LAPAROTOMY N/A 08/31/2017   Procedure: EXPLORATORY LAPAROTOMY for SBO, ileocolectomy, removal of piece of uterine wall;  Surgeon: [name], MD;  Location: [facility] ORS;  Service: General;  Laterality: N/A;    Family History:  History reviewed. No pertinent family history.  Social History:  Social History   Socioeconomic History  . Marital status: Legally Separated    Spouse name: Not on file  . Number of children: Not on file  . Years of education: Not on file  . Highest education level: Not on file  Social Needs  . Financial resource strain: Not on file  . Food insecurity - worry: Not on file  . Food insecurity - inability: Not on file  . Transportation needs - medical: Not on file  . Transportation needs - non-medical: Not on file  Occupational History  . Not on file  Tobacco Use  . Smoking status: Never Smoker  . Smokeless tobacco: Never Used  Substance and Sexual Activity  . Alcohol use: No    Frequency: Never  . Drug use: No  . Sexual activity: Not on file  Other Topics Concern  . Not on file  Social History Narrative  . Not on file    Allergies:  Allergies  Allergen Reactions  . Ketamine Other (See Comments)    Unknown but given during previous cancer treatment and untolerated.  Medications: Prior to Admission medications   Medication Sig Start Date End Date Taking? Authorizing Provider  acetaminophen (TYLENOL) 325 MG tablet Take 650 mg every 6 (six) hours as needed by mouth.   Yes [provider]  amLODipine (NORVASC) 10 MG tablet Take 10 mg daily by mouth.   Yes [provider]  doxycycline (VIBRAMYCIN) 100 MG capsule Take 1 capsule (100 mg total) by mouth 2 (two) times daily. 09/10/17  Yes  Vickie Epley, MD  gabapentin (NEURONTIN) 300 MG capsule Take 300 mg 3 (three) times daily by mouth.   Yes [provider]  oxyCODONE-acetaminophen (PERCOCET/ROXICET) 5-325 MG tablet Take 1-2 tablets by mouth every 4 (four) hours as needed for moderate pain. 09/10/17  Yes Vickie Epley, MD  promethazine (PHENERGAN) 25 MG suppository Place 25 mg every 6 (six) hours as needed rectally for nausea or vomiting.   Yes [provider]  sertraline (ZOLOFT) 50 MG tablet Take 50 mg daily by mouth.   Yes [provider]  azithromycin (ZITHROMAX) 250 MG tablet TAKE 2 TABLETS BY MOUTH ON DAY 1 AND THEN 1 TABLET EVERYDAY FOR 4 DAYS 07/31/17   [provider]  HYDROcodone-acetaminophen (NORCO/VICODIN) 5-325 MG tablet TAKE ONE Tablet BY MOUTH EVERY 6 HOURS AS NEEDED FOR moderate PAIN 07/31/17   [provider]  ibuprofen (ADVIL,MOTRIN) 600 MG tablet Take 600 mg every 6 (six) hours by mouth.    [provider]  ibuprofen (ADVIL,MOTRIN) 800 MG tablet TAKE ONE Tablet BY MOUTH EVERY 8 HOURS AS NEEDED FOR 5 DAYS 07/31/17   [provider]  ketorolac (TORADOL) 10 MG tablet TAKE ONE Tablet BY MOUTH EVERY 6 TO 8 HOURS AS NEEDED FOR PAIN 07/17/17   [provider]  meloxicam (MOBIC) 15 MG tablet Take 15 mg by mouth daily. 07/18/17   [provider]  Multiple Vitamin (MULTIVITAMIN WITH MINERALS) TABS tablet Take 1 tablet daily by mouth.    [provider]  ondansetron (ZOFRAN-ODT) 8 MG disintegrating tablet PLACE 1 Tablet ON TONGUE TO DISSOLVE EVERY 8 HOURS AS NEEDED FOR NAUSEA 08/20/17   [provider]  pantoprazole (PROTONIX) 20 MG tablet Take 20 mg daily by mouth.    [provider]  traMADol (ULTRAM) 50 MG tablet Take 1 Tablet by mouth every 6 hours AS NEEDED FOR moderate PAIN 07/06/17   [provider]  VENTOLIN HFA 108 (90 Base) MCG/ACT inhaler INHALE 1-2 PUFFS BY MOUTH EVERY 4 HOURS AS NEEDED FOR  WHEEZING / SHORTNESS OF BREATH 08/02/17   [provider]    Physical Exam Vitals: Blood pressure 122/66, pulse 82, temperature 97.6 F (36.4 C), temperature source Oral, resp. rate 16, height 5' 9"  (1.753 m), weight 147 lb 11.3 oz (67 kg), SpO2 100 %. General: NAD HEENT: normocephalic, anicteric Pulmonary: No increased work of breathing Cardiovascular: RRR, distal pulses 2+ Abdomen: Soft, moderately tender in the right lower quadrant.  Surgical dressing dry clean and intact Genitourinary: deferred Extremities: no edema, erythema, or tenderness Neurologic: Grossly intact Psychiatric: mood appropriate, affect full  Labs: Results for orders placed or performed during the hospital encounter of 09/11/17 (from the past 72 hour(s))  Comprehensive metabolic panel     Status: Abnormal   Collection Time: 09/11/17  7:32 PM  Result Value Ref Range   Sodium 133 (L) 135 - 145 mmol/L   Potassium 3.1 (L) 3.5 - 5.1 mmol/L   Chloride 100 (L) 101 - 111 mmol/L   CO2 26 22 - 32  mmol/L   Glucose, Bld 90 65 - 99 mg/dL   BUN 10 6 - 20 mg/dL   Creatinine, Ser 0.47 0.44 - 1.00 mg/dL   Calcium 8.4 (L) 8.9 - 10.3 mg/dL   Total Protein 6.8 6.5 - 8.1 g/dL   Albumin 2.3 (L) 3.5 - 5.0 g/dL   AST 30 15 - 41 U/L   ALT 25 14 - 54 U/L   Alkaline Phosphatase 217 (H) 38 - 126 U/L   Total Bilirubin 0.5 0.3 - 1.2 mg/dL   GFR calc non Af Amer >60 >60 mL/min   GFR calc Af Amer >60 >60 mL/min    Comment: (NOTE) The eGFR has been calculated using the CKD EPI equation. This calculation has not been validated in all clinical situations. eGFR's persistently <60 mL/min signify possible Chronic Kidney Disease.    Anion gap 7 5 - 15  CBC with Differential     Status: Abnormal   Collection Time: 09/11/17  7:32 PM  Result Value Ref Range   WBC 7.0 3.6 - 11.0 K/uL   RBC 3.09 (L) 3.80 - 5.20 MIL/uL   Hemoglobin 10.4 (L) 12.0 - 16.0 g/dL   HCT 29.6 (L) 35.0 - 47.0 %   MCV 95.9 80.0 - 100.0 fL   MCH 33.5 26.0  - 34.0 pg   MCHC 35.0 32.0 - 36.0 g/dL   RDW 13.4 11.5 - 14.5 %   Platelets 293 150 - 440 K/uL   Neutrophils Relative % 77 %   Neutro Abs 5.4 1.4 - 6.5 K/uL   Lymphocytes Relative 17 %   Lymphs Abs 1.2 1.0 - 3.6 K/uL   Monocytes Relative 5 %   Monocytes Absolute 0.3 0.2 - 0.9 K/uL   Eosinophils Relative 1 %   Eosinophils Absolute 0.1 0 - 0.7 K/uL   Basophils Relative 0 %   Basophils Absolute 0.0 0 - 0.1 K/uL  Magnesium     Status: Abnormal   Collection Time: 09/11/17 10:17 PM  Result Value Ref Range   Magnesium 1.2 (L) 1.7 - 2.4 mg/dL  Basic metabolic panel     Status: Abnormal   Collection Time: 09/12/17  4:46 AM  Result Value Ref Range   Sodium 135 135 - 145 mmol/L   Potassium 3.2 (L) 3.5 - 5.1 mmol/L   Chloride 105 101 - 111 mmol/L   CO2 23 22 - 32 mmol/L   Glucose, Bld 88 65 - 99 mg/dL   BUN 10 6 - 20 mg/dL   Creatinine, Ser 0.56 0.44 - 1.00 mg/dL   Calcium 8.0 (L) 8.9 - 10.3 mg/dL   GFR calc non Af Amer >60 >60 mL/min   GFR calc Af Amer >60 >60 mL/min    Comment: (NOTE) The eGFR has been calculated using the CKD EPI equation. This calculation has not been validated in all clinical situations. eGFR's persistently <60 mL/min signify possible Chronic Kidney Disease.    Anion gap 7 5 - 15  Magnesium     Status: None   Collection Time: 09/12/17  4:46 AM  Result Value Ref Range   Magnesium 1.9 1.7 - 2.4 mg/dL  CBC WITH DIFFERENTIAL     Status: Abnormal   Collection Time: 09/12/17  4:46 AM  Result Value Ref Range   WBC 5.9 3.6 - 11.0 K/uL   RBC 2.82 (L) 3.80 - 5.20 MIL/uL   Hemoglobin 9.2 (L) 12.0 - 16.0 g/dL   HCT 27.2 (L) 35.0 - 47.0 %   MCV 96.5 80.0 -  100.0 fL   MCH 32.7 26.0 - 34.0 pg   MCHC 33.8 32.0 - 36.0 g/dL   RDW 13.0 11.5 - 14.5 %   Platelets 253 150 - 440 K/uL   Neutrophils Relative % 84 %   Neutro Abs 5.0 1.4 - 6.5 K/uL   Lymphocytes Relative 10 %   Lymphs Abs 0.6 (L) 1.0 - 3.6 K/uL   Monocytes Relative 4 %   Monocytes Absolute 0.2 0.2 - 0.9 K/uL    Eosinophils Relative 2 %   Eosinophils Absolute 0.1 0 - 0.7 K/uL   Basophils Relative 0 %   Basophils Absolute 0.0 0 - 0.1 K/uL    Imaging   Ct Abdomen Pelvis Wo Contrast  Result Date: 09/11/2017 CLINICAL DATA:  Increasing abdominal pain, status post ileo colectomy for bowel obstruction August 31, 2017. On antibiotics for postsurgical infection. History of cervical cancer and chemo radiation. EXAM: CT ABDOMEN AND PELVIS WITHOUT CONTRAST TECHNIQUE: Multidetector CT imaging of the abdomen and pelvis was performed following the standard protocol without IV contrast. COMPARISON:  CT abdomen and pelvis August 26, 2017 and abdominal radiograph August 31, 2017 FINDINGS: LOWER CHEST: Numerous solid pulmonary nodules in lung bases measuring to 6 mm. Heart size is normal. No pericardial effusion. HEPATOBILIARY: Status post cholecystectomy. Stable postoperative CBD dilatation without cholelithiasis. Mildly hypodense liver compatible with mild steatosis. PANCREAS: Normal. SPLEEN: Normal. ADRENALS/URINARY TRACT: Kidneys are orthotopic, normal morphology. Punctate bilateral nephrolithiasis. No hydronephrosis. Limited assessment for renal mass by noncontrast CT. The unopacified ureters are normal in course and caliber. Urinary bladder is partially distended and unremarkable. Normal adrenal glands. STOMACH/BOWEL: Status post ileocolectomy, mildly dilated colon at 4.6 cm with air stool level. Decompressed small bowel. VASCULAR/LYMPHATIC: Aortoiliac vessels are normal in course and caliber. Mild calcific atherosclerosis. No lymphadenopathy by CT size criteria. REPRODUCTIVE: Asymmetric anterior uterine wall compatible with postsurgical changes, no focal fluid collection. OTHER: Small amount of RIGHT lower quadrant intraperitoneal gas and free fluid. Presacral fat stranding suggesting post treatment related changes. Debris within infraumbilical component of ventral abdominal wall incision. MUSCULOSKELETAL:  Nonacute. 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 IMPRESSION: 1. Debris within anterior abdominal wall incision concerning for infection, versus packing material. Recommend direct inspection. 2. Status post ileo colectomy with expected postoperative changes. Mild colonic ileus. 3. Numerous pulmonary nodules highly concerning for metastatic disease. 4. Punctate nonobstructing nephrolithiasis. Aortic Atherosclerosis (ICD10-I70.0). Electronically Signed   By: Elon Alas M.D.   On: 09/11/2017 23:19   Dg Abd 1 View  Result Date: 08/30/2017 CLINICAL DATA:  Evaluate contrast movement after Gastrografin administration. EXAM: ABDOMEN - 1 VIEW COMPARISON:  08/29/2017. FINDINGS: Enteric contrast remains concentrated within the colon, increasingly dense compared with yesterday's radiograph. No signs of obstruction. Nasogastric tube tip lies near the duodenum. Cholecystectomy clips. Degenerative change lumbar spine. IMPRESSION: Enteric contrast remains within the colon, including the RIGHT, transverse, and descending colon. Electronically Signed   By: Staci Righter M.D.   On: 08/30/2017 08:47   Dg Abd 1 View  Result Date: 08/29/2017 CLINICAL DATA:  8 hour small-bowel follow-up study EXAM: ABDOMEN - 1 VIEW COMPARISON:  08/29/2017 FINDINGS: Scattered large and small bowel gas is noted. Persistent small bowel dilatation is noted. Contrast material is noted scattered throughout the small bowel. Contrast is noted residual within the colon from the prior exam. The proximal right colon does not appear to contain contrast material. Continued follow-up is recommended. IMPRESSION: Contrast material is noted throughout the dilated small bowel. The right colon shows contrast  within although this is stable from the earlier exam and related to prior studies. Continued follow-up is recommended. Electronically Signed   By: Inez Catalina M.D.   On: 08/29/2017 16:40   Dg Abd 1 View  Result Date:  08/29/2017 CLINICAL DATA:  Small bowel obstruction. EXAM: ABDOMEN - 1 VIEW COMPARISON:  Acute abdominal series 08/27/2017. FINDINGS: NG tube is in place. Multiple distended loops of small bowel are similar to the prior study. There is some gas in the colon. There is no free air. IMPRESSION: 1. Persistent dilated loops of small bowel compatible with small bowel obstruction. There is no significant interval change. 2. Some gas is present throughout the colon. The obstruction is incomplete. 3. NG tube is stable in the stomach. Electronically Signed   By: San Morelle M.D.   On: 08/29/2017 07:31   Ct Abdomen Pelvis W Contrast  Addendum Date: 09/13/2017   ADDENDUM REPORT: 09/13/2017 13:34 ADDENDUM: Omitted from initial impression: The small infarcts within the uterus and at the inferior pole of the LEFT kidney are of uncertain etiology but could be seen with embolic disease such as from valvular heart disease. Electronically Signed   By: Lavonia Dana M.D.   On: 09/13/2017 13:34   Result Date: 09/13/2017 CLINICAL DATA:  Mild RIGHT lower quadrant abdominal pain, postsurgical wound infection 12 days post ileocecal ectomy with primary anastomosis secondary to small-bowel obstruction, suspected abscess, hypertension, history cervical cancer EXAM: CT ABDOMEN AND PELVIS WITH CONTRAST TECHNIQUE: Multidetector CT imaging of the abdomen and pelvis was performed using the standard protocol following bolus administration of intravenous contrast. Sagittal and coronal MPR images reconstructed from axial data set. CONTRAST:  152m ISOVUE-300 IOPAMIDOL (ISOVUE-300) INJECTION 61% IV. Dilute oral contrast. COMPARISON:  09/11/2017, 08/26/2017 FINDINGS: Lower chest: Numerous noncalcified nodules at both lung bases again identified question metastatic disease in patient with history of malignancy. Hepatobiliary: Gallbladder surgically absent.  Liver unremarkable. Pancreas: Normal appearance Spleen: Normal appearance  Adrenals/Urinary Tract: Adrenal glands normal appearance. Tiny probable infarct at inferior pole LEFT kidney on delayed images. Kidneys, ureters, and bladder otherwise normal appearance Stomach/Bowel: Stomach unremarkable. Post ileocecectomy with ileocolic anastomosis in the RIGHT mid abdomen. Bowel wall thickening of ileal loops in the RIGHT pelvis. No evidence of bowel obstruction or bowel dilatation. Remaining bowel loops normal in appearance. Vascular/Lymphatic: Atherosclerotic calcifications aorta without aneurysm. No adenopathy. Reproductive: Absent enhancement of the RIGHT lateral aspect of the lower uterus consistent with infarct, new since 08/26/2017. Fluid within endometrial canal. Adnexa unremarkable. Small focus of gas within vagina. Other: Scattered stranding of intra-abdominal tissue planes especially in the RIGHT mid abdomen in RIGHT pelvis consistent with preceding abdominal surgery. Two foci of extraluminal gas are seen within the mesentery medial to the ileocecal anastomosis, decreased in number since previous exam. No discrete abscess collection. Gas identified within the ventral surgical wound at the level of the upper pelvis at site of known wound infection and corresponding to abnormality on prior CT. Musculoskeletal: Degenerative disc disease changes lumbar spine. No acute osseous findings. IMPRESSION: Postsurgical changes from ileocecectomy with primary ileocolic anastomosis without evidence of abscess or leak. Edema of small bowel loops at distal ileum potentially related to surgery. Gas within ventral midline surgical wound corresponding to wound infection versus packing material as noted on prior exam. Small infarct at inferior pole LEFT kidney. RIGHT uterine infarct. Electronically Signed: By: MLavonia DanaM.D. On: 09/13/2017 12:05   Ct Abdomen Pelvis W Contrast  Result Date: 08/26/2017 CLINICAL DATA:  51y/o F;  history of cervical cancer status post chemotherapy and radiation. Recent  small bowel obstruction. Presenting with vomiting. EXAM: CT ABDOMEN AND PELVIS WITH CONTRAST TECHNIQUE: Multidetector CT imaging of the abdomen and pelvis was performed using the standard protocol following bolus administration of intravenous contrast. CONTRAST:  53m ISOVUE-300 IOPAMIDOL (ISOVUE-300) INJECTION 61% COMPARISON:  None. FINDINGS: Lower chest: Numerous pulmonary nodules in the lung bases measuring up to 9 mm on the left, likely metastatic disease (series 4, image 12). Hepatobiliary: No focal liver lesion identified. Status post cholecystectomy. Mild intra and extrahepatic biliary ductal dilatation the common bile duct measuring up to 13 mm which may be compensatory post cholecystectomy, clinical correlation recommended. Pancreas: Unremarkable. No pancreatic ductal dilatation or surrounding inflammatory changes. Spleen: Normal in size without focal abnormality. Adrenals/Urinary Tract: Adrenal glands are unremarkable. Punctate right kidney lower pole and left kidney interpolar nephrolithiasis. No hydronephrosis. Normal bladder. Stomach/Bowel: Small bowel obstruction with transition in the pelvis just superior to the uterus where there is a long segment of distal ileum which demonstrates fatty wall thickening compatible chronic inflammation and/or radiation enteritis. Colon is unremarkable. Normal appendix. There is thickening of the mesial rectal fascia which likely represents sequelae of radiation. Vascular/Lymphatic: Aortic atherosclerosis. No enlarged abdominal or pelvic lymph nodes. Reproductive: Fluid-filled uterus which may reflect stenosis at the level of the cervix or postsurgical changes. No discrete mass identified. Other: Small volume of pelvic fluid, likely physiologic. Musculoskeletal: Mild lumbar spine levocurvature and moderate multilevel disc and facet degenerative changes. No acute osseous abnormality is evident. IMPRESSION: 1. Small bowel obstruction with transition in the pelvis just  superior uterus at a long segment of ileum demonstrating chronic inflammatory/ radiation treatment changes. 2. Numerous pulmonary nodules, likely metastatic disease. 3. Enlarged common bile duct post cholecystectomy may be compensatory. No obstructing stone or mass identified. Clinical correlation recommended. 4. Bilateral kidney punctate nephrolithiasis. 5. Aortic atherosclerosis. 6. Fluid filled uterus may reflect cervical stenosis or postsurgical changes. No discrete mass identified. Electronically Signed   By: LKristine GarbeM.D.   On: 08/26/2017 23:17   Dg Abd 2 Views  Result Date: 08/27/2017 CLINICAL DATA:  Small bowel obstruction EXAM: ABDOMEN - 2 VIEW COMPARISON:  Abdominal radiograph from earlier today FINDINGS: Enteric tube terminates in the distal stomach. Surgical clips overlie the right upper quadrant. Dilated small bowel loops with air-fluid levels throughout the abdomen measuring up to 5.0 cm diameter, minimally improved. No evidence of pneumatosis or pneumoperitoneum. Mild colonic stool. Excreted contrast is noted in the bladder. Clear lung bases. Marked lumbar spondylosis. IMPRESSION: Minimal improvement in distal small bowel obstruction. Enteric tube terminates in the distal stomach. Electronically Signed   By: JIlona SorrelM.D.   On: 08/27/2017 08:04   Dg Abd Portable 1v  Result Date: 08/31/2017 CLINICAL DATA:  Nasogastric tube placement. EXAM: PORTABLE ABDOMEN - 1 VIEW COMPARISON:  Abdominal radiograph performed 08/30/2017 FINDINGS: The patient's enteric tube is noted ending overlying the pylorus. There is dilatation of small-bowel loops to 4.9 cm in maximal diameter, possibly reflecting diffuse small bowel dysmotility. Clips are noted within the right upper quadrant, reflecting prior cholecystectomy. Degenerative change is noted along the lumbar spine. The visualized lung bases are grossly clear. IMPRESSION: Enteric tube noted ending overlying the pylorus. Electronically  Signed   By: JGarald BaldingM.D.   On: 08/31/2017 02:07   Dg Abd Portable 1 View  Result Date: 08/27/2017 CLINICAL DATA:  51year old female with small bowel obstruction status post NG tube placement. EXAM: PORTABLE ABDOMEN - 1 VIEW  COMPARISON:  Abdominal CT dated 08/26/2017 FINDINGS: An enteric tube is noted with tip and side-port in the left upper abdomen likely in the proximal stomach. Multiple air-filled dilated small bowel loops again noted measuring up to 5.5 cm in diameter. There is right upper quadrant cholecystectomy clips. Excreted contrast from recent CT noted within the urinary bladder as well as within the ureters. There is degenerative changes of the spine. IMPRESSION: Enteric tube likely in the proximal stomach. Persistent dilatation of small-bowel loops. Electronically Signed   By: Anner Crete M.D.   On: 08/27/2017 00:51   Dg Abd Portable 1 View  Result Date: 08/26/2017 CLINICAL DATA:  Patient was recently inpatient in [place] [facility] 5 days ago with small bowel obstruction thought to be due to radiation treatment for cancer. Patient has been vomiting over the past 3 days without relief. EXAM: PORTABLE ABDOMEN - 1 VIEW COMPARISON:  None. FINDINGS: Examination demonstrates multiple air-filled dilated small bowel loops measuring up to 4.2 cm in diameter. There is air seen over the colon. No evidence of free peritoneal air. No mass mass effect. There are degenerative changes of the spine. Phleboliths over the pelvis. IMPRESSION: Findings compatible with a small bowel ileus versus early/partial mid to distal small bowel obstruction. Electronically Signed   By: Marin Olp M.D.   On: 08/26/2017 20:52   Dg Abd Portable 2v  Result Date: 08/31/2017 CLINICAL DATA:  Acute onset of generalized abdominal pain. EXAM: PORTABLE ABDOMEN - 2 VIEW COMPARISON:  Abdominal radiograph performed earlier today at 7:29 a.m. FINDINGS: There is distention of small-bowel loops to 5.4 cm in maximal  diameter, with associated air-fluid levels. However, on the recent prior study, contrast has progressed to the colon. This may reflect significant small bowel dysmotility, without definite obstruction. No free intra-abdominal air is identified on the provided decubitus view. The visualized osseous structures are within normal limits; the sacroiliac joints are unremarkable in appearance. The visualized lung bases are essentially clear. Clips are noted within the right upper quadrant, reflecting prior cholecystectomy. IMPRESSION: Persistent distention of small-bowel loops to 5.4 cm in maximal diameter, with associated air-fluid levels. However, on the recent prior study, contrast has progressed to the colon. This may reflect significant small bowel dysmotility, without definite evidence of obstruction. No free intra-abdominal air seen. Electronically Signed   By: Garald Balding M.D.   On: 08/31/2017 01:13    Assessment: 51 y.o. with cervical cancer and uterine infarction  Plan: 1) Uterine infarct - given not present on prior imaging obtained 08/26/17, this is unlikely to represent post radiation changes.  The fact that there also appears to be an infarct in the left kidney raises concern for thromboembolic etiology.  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  Agree with proceed with TTE for cardiac work up of possible etiology for patient current infarctions.  Given prior radiation and cervical cancer diagnosis, the patient would require gynecology oncology should surgical intervention be required.  Given concern for possible pulmonary nodules that may represent distant metastasis it is also questionable if she would benefit long term from a radical hysterectomy, and based on the patient's report radical hysterectomy had initially been planned but was aborted  secondary to scar tissue which was encountered at the time of surgery.  At present symptomatic treatment of the patient's pain with  a combination of nonsteroidal such as Toradol and narcotics to see if symptoms ameliorate over time would likely be the most prudent approach.  I will arrange for the patient to see [facility] outpatient 09/19/2017 when they are in clinic here at [facility]

## 2017-09-13 NOTE — Progress Notes (Addendum)
ADDENDUM: Results of CT abdominal/pelvis imaging study with oral and IV contrast discussed with patient. CT personally reviewed with patient. Patient denies any history of heart palpitations, unilateral weakness or speech difficulty, denies history of unilateral lower extremity swelling or calf pain, denies any history of blood clots. Patient's history of cervical cancer, treatment, and subsequent workup were also discussed.  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   Atherosclerotic aorta calcifications without aneurysm. No adenopathy.  Postsurgical changes from ileocecectomy with primary ileocolic anastomosis without evidence of abscess or leak. Edema of small  bowel loops at distal ileum potentially related to surgery.  Gas within ventral midline surgical wound corresponding to wound infection versus packing material as noted on prior exam.  Assessment/Plan: 51 y.o. 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   - will check echocardiogram to assess for thrombus and valvular disease  - oncology and gynecology consultations requested (discussed with Dr. Mike Gip)  - continue DVT prophylaxis, ambulation encouraged  - will advance to heart-healthy diet  -- Corene Cornea E. Rosana Hoes, MD, [place]:  [facility] - Partnering for exceptional care. Office: [place] [facility] Day(s): 50.   Post op day(s):  Marland Kitchen   Interval History: Patient seen and examined, no acute events or new complaints overnight. Patient reports her abdominal pain continues to improve, though is worst with packing/dressing changes for her midline post-surgical open wound. She otherwise describes tolerating clear liquids (and PO contrast) with appetite to eat regular food following CT imaging this morning, +flatus and +loose non-watery, non-foul-smelling BM's, denies N/V, fever/chills, CP, or SOB.  Review of Systems:  Constitutional: denies fever, chills  HEENT: denies cough or congestion  Respiratory: denies any shortness of breath  Cardiovascular: denies chest pain or palpitations  Gastrointestinal: abdominal pain, N/V, and bowel function as per interval history Genitourinary: denies burning with urination or urinary frequency Musculoskeletal: denies pain, decreased motor or sensation Integumentary: denies any other rashes or skin discolorations except post-surgical abdominal wound Neurological: denies HA or vision/hearing changes   Vital signs in last 24 hours: [min-max] current  Temp:  [98.2 F (36.8 C)-98.4 F (36.9 C)] 98.4 F (36.9 C) (11/29 0439) Pulse Rate:  [78-94] 94 (11/29 0439) Resp:  [16-18] 18 (11/29 0439) BP: (108-121)/(61-81) 108/61 (11/29 0439) SpO2:  [97 %-100 %] 97 % (11/29 0439)     Height: 5' 9"  (175.3 cm) Weight: 147 lb 11.3 oz (67 kg) BMI (Calculated): 21.8   Intake/Output this shift:  Total I/O In: 180 [P.O.:180] Out: -    Intake/Output last 2 shifts:  @IOLAST2SHIFTS @   Physical Exam:  Constitutional: alert, cooperative and no distress  HENT: normocephalic without obvious abnormality  Eyes: PERRL, EOM's grossly intact and symmetric  Neuro: CN II - XII grossly  intact and symmetric without deficit  Respiratory:  breathing non-labored at rest  Cardiovascular: regular rate and sinus rhythm  Gastrointestinal: soft and non-distended with mild peri-incisional abdominal tenderness to palpation, wound packing/dressing saturated with purulent fluid including a "reinforced" dressing by RN overlying the rest of patient's otherwise intact post-surgical abdominal wound, no surrounding erythema Musculoskeletal: UE and LE FROM, no edema or wounds, motor and sensation grossly intact, NT   Labs:  CBC Latest Ref Rng & Units 09/12/2017 09/11/2017 09/04/2017  WBC 3.6 - 11.0 K/uL 5.9 7.0 4.5  Hemoglobin 12.0 - 16.0 g/dL 9.2(L) 10.4(L) 8.7(L)  Hematocrit 35.0 - 47.0 % 27.2(L) 29.6(L) 25.3(L)  Platelets 150 - 440 K/uL 253 293 202   CMP Latest Ref Rng & Units 09/12/2017 09/11/2017 09/05/2017  Glucose 65 - 99 mg/dL 88 90 101(H)  BUN 6 - 20 mg/dL 10 10 20   Creatinine 0.44 - 1.00 mg/dL 0.56 0.47 0.68  Sodium 135 - 145 mmol/L 135 133(L) 135  Potassium 3.5 - 5.1 mmol/L 3.2(L) 3.1(L) 3.6  Chloride 101 - 111 mmol/L 105 100(L) 107  CO2 22 - 32 mmol/L 23 26 23   Calcium 8.9 - 10.3 mg/dL 8.0(L) 8.4(L) 8.0(L)  Total Protein 6.5 - 8.1 g/dL - 6.8 -  Total Bilirubin 0.3 - 1.2 mg/dL - 0.5 -  Alkaline Phos 38 - 126 U/L - 217(H) -  AST 15 - 41 U/L - 30 -  ALT 14 - 54 U/L - 25 -   Imaging studies: No new pertinent imaging studies available at this time   Assessment/Plan: (ICD-10's: R10.9) 51 y.o. female with mild RLQ abdominal pain and post-surgical wound infection 13 days Post-Op s/p ileocecectomy with primary anastomosis for SBO not improving with non-operative management, complicated by pertinent comorbidities including HTN, history of cervical cancer treated only with chemoradiation without hysterectomy, pulmonary nodules, and generalized anxiety disorder.   - antibiotics (currently Zosyn)             - clear liquids diet for now, pending repeat CT             - continue to monitor abdominal exam and bowel function   -  discussed with patient's RN dry gauze packing/dressing changes BID + as needed, keep incision clean and dry             - follow-up pending repeat CT abdomen and pelvis with oral contrast to better assess recent ileocolic anastomosis             - medical management of pertinent comorbidities             - DVT prophylaxis, limited ambulation  All of the above findings and recommendations were discussed with the patient, and all of patient's questions were answered to her expressed satisfaction.    -- Marilynne Drivers Rosana Hoes, MD, [place]: [facility] - Partnering for exceptional care. Office: [phone]

## 2017-09-13 NOTE — Consult Note (Signed)
[facility]  Date of admission:  09/12/2017  Inpatient day:  09/13/2017  Consulting physician: Dr. 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 female with a history of cervical cancer and SBO s/p right ileocolectomy who was admitted through the emergency room with abdominal pain.  HPI:  The patient was diagnosed with cervical cancer in [place] in 09/2016.  She has had a long standing history of abnormal PAP smears.  She presented with an ovarian cyst.  Laparoscopic surgery was difficult secondary to scar tissue.  The cyst ruptured.  Biopsy material confirmed cervical cancer.   She was treated by Dr Christene Slates at [facility] in [place], [place].  Decision was made to pursue concurrent chemotherapy (weekly cisplatin) and radiation.  She received treatment from 11/2016 - 05/2017.  She received both external beam radiation and brachytherapy.  Course was complicated by weight loss (80 pounds), nausea, vomiting, electrolyte wasting (potassium and magnesium).  She describes being "sick constantly" and requiring at least 20 hospitalizations.  She underwent follow-up chest CT then PET scan in 06/2017.  She states that "the radiation worked" and there was no disease in the abdomen.  She was noted to have lung nodules that were growing.  She is scheduled to have follow-up imaging in 10/2017.  She was admitted in [place] with a small bowel obstruction.  She was managed conservatively.  She was home for about a week then traveled to [place] for Thanksgiving holiday (she has family here).  She presented on 08/27/2017 with nausea, vomiting, and lower abdominal pain.  Symptoms did not respond to conservative measurement.    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  She underwent laparotomy and right ileocolectomy on 08/31/2017.  There were numerous pulmonary nodules c/w metastatic disease.  Surgical findings revealed a thickened, matted, and scarred piece of distal small bowel close to the ileocecal valve.  Diet was slowly advanced.  She was discharged on 09/05/2017.  She was readmitted on 09/12/2017.  She describes the onset of lower abdominal pain on 09/09/2017.  Pain markedly increased in intensity on 09/11/2017.  She noted loose stools.  She has also had right sided abdominal pain x 2 weeks.  She noted drainage from the lower part of her incision beginning 09/10/2017.  Staples were removed and the wound packed.  She was started on doxycycline.  Abdominal and pelvic CT without contrast on 09/11/2017 revealed debris within anterior abdominal wall incision concerning for infection, versus packing material.   She is s/p ileocolectomy with expected postoperative changes and mild colonic ileus.  There were numerous pulmonary nodules highly concerning for metastatic disease and punctate nonobstructing nephrolithiasis.  Abdomen and pelvic CT with contrast today revealed postsurgical changes from ileocecectomy with primary ileocolic anastomosis without evidence of abscess or leak.  There was edema of small bowel loops at distal ileum potentially related to surgery.  There was gas within ventral midline surgical wound corresponding to wound infection versus packing material.  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  She denies any history of thrombosis.  She denies any family history of thrombosis.  She stopped  smoking 10 years ago (1/4 ppd).  She denies any family history of rheumatologic problems/vasculitis/autoimmune disorders.   Symptomatically, she describes nausea  last night.  Diet has advanced from clears.  Bowel movements are loose without blood or mucus.  She denies any hematuria.   Past Medical History:  Diagnosis Date  . Anxiety   . Cancer ([place])    cervix  . Hepatitis C   . Hypertension     Past Surgical History:  Procedure Laterality Date  . LAPAROTOMY N/A 08/31/2017   Procedure: EXPLORATORY LAPAROTOMY for SBO, ileocolectomy, removal of piece of uterine wall;  Surgeon: [name], MD;  Location: [facility] ORS;  Service: General;  Laterality: N/A;    History reviewed. No pertinent family history.  Social History:  reports that  has never smoked. she has never used smokeless tobacco. She reports that she does not drink alcohol or use drugs.  The patient's 38 year old daughter died of alcoholism.  The patient is originally from [place].  She currently lives in [place].  She is alone today.  Allergies:  Allergies  Allergen Reactions  . Ketamine Other (See Comments)    Unknown but given during previous cancer treatment and untolerated.     Medications Prior to Admission  Medication Sig Dispense Refill  . acetaminophen (TYLENOL) 325 MG tablet Take 650 mg every 6 (six) hours as needed by mouth.    Marland Kitchen amLODipine (NORVASC) 10 MG tablet Take 10 mg daily by mouth.    . doxycycline (VIBRAMYCIN) 100 MG capsule Take 1 capsule (100 mg total) by mouth 2 (two) times daily. 14 capsule 0  . gabapentin (NEURONTIN) 300 MG capsule Take 300 mg 3 (three) times daily by mouth.    . oxyCODONE-acetaminophen (PERCOCET/ROXICET) 5-325 MG tablet Take 1-2 tablets by mouth every 4 (four) hours as needed for moderate pain. 10 tablet 0  . promethazine (PHENERGAN) 25 MG suppository Place 25 mg every 6 (six) hours as needed rectally for nausea or vomiting.    . sertraline (ZOLOFT) 50 MG tablet Take 50 mg daily by mouth.    Marland Kitchen azithromycin (ZITHROMAX) 250 MG tablet TAKE 2 TABLETS BY MOUTH ON DAY 1 AND THEN 1 TABLET EVERYDAY FOR 4 DAYS  0  .  HYDROcodone-acetaminophen (NORCO/VICODIN) 5-325 MG tablet TAKE ONE Tablet BY MOUTH EVERY 6 HOURS AS NEEDED FOR moderate PAIN  0  . ibuprofen (ADVIL,MOTRIN) 600 MG tablet Take 600 mg every 6 (six) hours by mouth.    Marland Kitchen ibuprofen (ADVIL,MOTRIN) 800 MG tablet TAKE ONE Tablet BY MOUTH EVERY 8 HOURS AS NEEDED FOR 5 DAYS  0  . ketorolac (TORADOL) 10 MG tablet TAKE ONE Tablet BY MOUTH EVERY 6 TO 8 HOURS AS NEEDED FOR PAIN  0  . meloxicam (MOBIC) 15 MG tablet Take 15 mg by mouth daily.  2  . Multiple Vitamin (MULTIVITAMIN WITH MINERALS) TABS tablet Take 1 tablet daily by mouth.    . ondansetron (ZOFRAN-ODT) 8 MG disintegrating tablet PLACE 1 Tablet ON TONGUE TO DISSOLVE EVERY 8 HOURS AS NEEDED FOR NAUSEA  1  . pantoprazole (PROTONIX) 20 MG tablet Take 20 mg daily by mouth.    . traMADol (ULTRAM) 50 MG tablet Take 1 Tablet by mouth every 6 hours AS NEEDED FOR moderate PAIN  0  . VENTOLIN HFA 108 (90 Base) MCG/ACT inhaler INHALE 1-2 PUFFS BY MOUTH EVERY 4 HOURS AS NEEDED FOR WHEEZING / SHORTNESS OF BREATH  0    Review of Systems: GENERAL:  Feels "bad".  No fevers or sweats.  Weight loss of 80 pounds since diagnosis of cervical cancer. PERFORMANCE STATUS (ECOG):  2 HEENT:  Runny nose.  No visual changes, sore throat, mouth sores or tenderness. Lungs: No shortness of breath or cough.  No hemoptysis. Cardiac:  Known heart murmur.  No chest pain, palpitations, orthopnea, or PND. GI:  Nausea last night.  Loose bowel movements.  No vomiting, constipation, melena or hematochezia. GU:  No urgency, frequency, dysuria, or hematuria. H/o recurrent UTIs. 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  No back pain.  No muscle tenderness. Extremities:  No pain or swelling. Skin:  No rashes or skin changes. Neuro:  No headache, numbness or weakness, balance or coordination issues. Endocrine:  No diabetes, thyroid issues, hot flashes or night sweats. Psych:  No mood changes, depression or anxiety. Pain:  No  focal pain. Review of systems:  All other systems reviewed and found to be negative.  Physical Exam:  Blood pressure 122/66, pulse 82, temperature 97.6 F (36.4 C), temperature source Oral, resp. rate 16, height 5' 9"  (1.753 m), weight 147 lb 11.3 oz (67 kg), SpO2 100 %.  GENERAL:  Fatigued appearing woman lying comfortably on the medical unit in no acute distress.  She appears older than stated age. MENTAL STATUS:  Alert and oriented to person, place and time. HEAD:  Long light brown hair.  Normocephalic, atraumatic, face symmetric, no Cushingoid features. EYES:  Blue eyes.  Pupils equal round and reactive to light and accomodation.  No conjunctivitis or scleral icterus. ENT:  Oropharynx clear without lesion.  Tongue normal.  No upper teeth.  Mucous membranes moist.  RESPIRATORY:  Decreased breath sounds at the bases.  Clear to auscultation without rales, wheezes or rhonchi. CARDIOVASCULAR:  Regular rate and rhythm without murmur, rub or gallop. ABDOMEN:  Large midline dressing in place.  Soft, tender lower abdomen and right of midline.  Active bowel sounds and no hepatosplenomegaly.  No masses. SKIN:  No stigmata or SBE.  No rashes, ulcers or lesions. EXTREMITIES: ICDs in place.  No edema, no skin discoloration or tenderness.  No palpable cords. LYMPH NODES: No palpable cervical, supraclavicular, axillary or inguinal adenopathy  NEUROLOGICAL: Unremarkable. PSYCH:  Appropriate.   Results for orders placed or performed during the hospital encounter of 09/11/17 (from the past 48 hour(s))  Comprehensive metabolic panel     Status: Abnormal   Collection Time: 09/11/17  7:32 PM  Result Value Ref Range   Sodium 133 (L) 135 - 145 mmol/L   Potassium 3.1 (L) 3.5 - 5.1 mmol/L   Chloride 100 (L) 101 - 111 mmol/L   CO2 26 22 - 32 mmol/L   Glucose, Bld 90 65 - 99 mg/dL   BUN 10 6 - 20 mg/dL   Creatinine, Ser 0.47 0.44 - 1.00 mg/dL   Calcium 8.4 (L) 8.9 - 10.3 mg/dL   Total Protein 6.8 6.5 - 8.1  g/dL   Albumin 2.3 (L) 3.5 - 5.0 g/dL   AST 30 15 - 41 U/L   ALT 25 14 - 54 U/L   Alkaline Phosphatase 217 (H) 38 - 126 U/L   Total Bilirubin 0.5 0.3 - 1.2 mg/dL   GFR calc non Af Amer >60 >60 mL/min   GFR calc Af Amer >60 >60 mL/min    Comment: (NOTE) The eGFR has been calculated using the CKD EPI equation. This calculation has not been validated in all clinical situations. eGFR's persistently <60 mL/min signify possible Chronic Kidney Disease.  Anion gap 7 5 - 15  CBC with Differential     Status: Abnormal   Collection Time: 09/11/17  7:32 PM  Result Value Ref Range   WBC 7.0 3.6 - 11.0 K/uL   RBC 3.09 (L) 3.80 - 5.20 MIL/uL   Hemoglobin 10.4 (L) 12.0 - 16.0 g/dL   HCT 29.6 (L) 35.0 - 47.0 %   MCV 95.9 80.0 - 100.0 fL   MCH 33.5 26.0 - 34.0 pg   MCHC 35.0 32.0 - 36.0 g/dL   RDW 13.4 11.5 - 14.5 %   Platelets 293 150 - 440 K/uL   Neutrophils Relative % 77 %   Neutro Abs 5.4 1.4 - 6.5 K/uL   Lymphocytes Relative 17 %   Lymphs Abs 1.2 1.0 - 3.6 K/uL   Monocytes Relative 5 %   Monocytes Absolute 0.3 0.2 - 0.9 K/uL   Eosinophils Relative 1 %   Eosinophils Absolute 0.1 0 - 0.7 K/uL   Basophils Relative 0 %   Basophils Absolute 0.0 0 - 0.1 K/uL  Magnesium     Status: Abnormal   Collection Time: 09/11/17 10:17 PM  Result Value Ref Range   Magnesium 1.2 (L) 1.7 - 2.4 mg/dL  Basic metabolic panel     Status: Abnormal   Collection Time: 09/12/17  4:46 AM  Result Value Ref Range   Sodium 135 135 - 145 mmol/L   Potassium 3.2 (L) 3.5 - 5.1 mmol/L   Chloride 105 101 - 111 mmol/L   CO2 23 22 - 32 mmol/L   Glucose, Bld 88 65 - 99 mg/dL   BUN 10 6 - 20 mg/dL   Creatinine, Ser 0.56 0.44 - 1.00 mg/dL   Calcium 8.0 (L) 8.9 - 10.3 mg/dL   GFR calc non Af Amer >60 >60 mL/min   GFR calc Af Amer >60 >60 mL/min    Comment: (NOTE) The eGFR has been calculated using the CKD EPI equation. This calculation has not been validated in all clinical situations. eGFR's persistently <60  mL/min signify possible Chronic Kidney Disease.    Anion gap 7 5 - 15  Magnesium     Status: None   Collection Time: 09/12/17  4:46 AM  Result Value Ref Range   Magnesium 1.9 1.7 - 2.4 mg/dL  CBC WITH DIFFERENTIAL     Status: Abnormal   Collection Time: 09/12/17  4:46 AM  Result Value Ref Range   WBC 5.9 3.6 - 11.0 K/uL   RBC 2.82 (L) 3.80 - 5.20 MIL/uL   Hemoglobin 9.2 (L) 12.0 - 16.0 g/dL   HCT 27.2 (L) 35.0 - 47.0 %   MCV 96.5 80.0 - 100.0 fL   MCH 32.7 26.0 - 34.0 pg   MCHC 33.8 32.0 - 36.0 g/dL   RDW 13.0 11.5 - 14.5 %   Platelets 253 150 - 440 K/uL   Neutrophils Relative % 84 %   Neutro Abs 5.0 1.4 - 6.5 K/uL   Lymphocytes Relative 10 %   Lymphs Abs 0.6 (L) 1.0 - 3.6 K/uL   Monocytes Relative 4 %   Monocytes Absolute 0.2 0.2 - 0.9 K/uL   Eosinophils Relative 2 %   Eosinophils Absolute 0.1 0 - 0.7 K/uL   Basophils Relative 0 %   Basophils Absolute 0.0 0 - 0.1 K/uL   Ct Abdomen Pelvis Wo Contrast  Result Date: 09/11/2017 CLINICAL DATA:  Increasing abdominal pain, status post ileo colectomy for bowel obstruction August 31, 2017. On antibiotics for postsurgical infection. History  of cervical cancer and chemo radiation. EXAM: CT ABDOMEN AND PELVIS WITHOUT CONTRAST TECHNIQUE: Multidetector CT imaging of the abdomen and pelvis was performed following the standard protocol without IV contrast. COMPARISON:  CT abdomen and pelvis August 26, 2017 and abdominal radiograph August 31, 2017 FINDINGS: LOWER CHEST: Numerous solid pulmonary nodules in lung bases measuring to 6 mm. Heart size is normal. No pericardial effusion. HEPATOBILIARY: Status post cholecystectomy. Stable postoperative CBD dilatation without cholelithiasis. Mildly hypodense liver compatible with mild steatosis. PANCREAS: Normal. SPLEEN: Normal. ADRENALS/URINARY TRACT: Kidneys are orthotopic, normal morphology. Punctate bilateral nephrolithiasis. No hydronephrosis. Limited assessment for renal mass by noncontrast CT.  The unopacified ureters are normal in course and caliber. Urinary bladder is partially distended and unremarkable. Normal adrenal glands. STOMACH/BOWEL: Status post ileocolectomy, mildly dilated colon at 4.6 cm with air stool level. Decompressed small bowel. VASCULAR/LYMPHATIC: Aortoiliac vessels are normal in course and caliber. Mild calcific atherosclerosis. No lymphadenopathy by CT size criteria. REPRODUCTIVE: Asymmetric anterior uterine wall compatible with postsurgical changes, no focal fluid collection. OTHER: Small amount of RIGHT lower quadrant intraperitoneal gas and free fluid. Presacral fat stranding suggesting post treatment related changes. Debris within infraumbilical component of ventral abdominal wall incision. MUSCULOSKELETAL: Nonacute. 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 IMPRESSION: 1. Debris within anterior abdominal wall incision concerning for infection, versus packing material. Recommend direct inspection. 2. Status post ileo colectomy with expected postoperative changes. Mild colonic ileus. 3. Numerous pulmonary nodules highly concerning for metastatic disease. 4. Punctate nonobstructing nephrolithiasis. Aortic Atherosclerosis (ICD10-I70.0). Electronically Signed   By: Elon Alas M.D.   On: 09/11/2017 23:19   Ct Abdomen Pelvis W Contrast  Addendum Date: 09/13/2017   ADDENDUM REPORT: 09/13/2017 13:34 ADDENDUM: Omitted from initial impression: The small infarcts within the uterus and at the inferior pole of the LEFT kidney are of uncertain etiology but could be seen with embolic disease such as from valvular heart disease. Electronically Signed   By: Lavonia Dana M.D.   On: 09/13/2017 13:34   Result Date: 09/13/2017 CLINICAL DATA:  Mild RIGHT lower quadrant abdominal pain, postsurgical wound infection 12 days post ileocecal ectomy with primary anastomosis secondary to small-bowel obstruction, suspected abscess, hypertension, history cervical cancer  EXAM: CT ABDOMEN AND PELVIS WITH CONTRAST TECHNIQUE: Multidetector CT imaging of the abdomen and pelvis was performed using the standard protocol following bolus administration of intravenous contrast. Sagittal and coronal MPR images reconstructed from axial data set. CONTRAST:  149m ISOVUE-300 IOPAMIDOL (ISOVUE-300) INJECTION 61% IV. Dilute oral contrast. COMPARISON:  09/11/2017, 08/26/2017 FINDINGS: Lower chest: Numerous noncalcified nodules at both lung bases again identified question metastatic disease in patient with history of malignancy. Hepatobiliary: Gallbladder surgically absent.  Liver unremarkable. Pancreas: Normal appearance Spleen: Normal appearance Adrenals/Urinary Tract: Adrenal glands normal appearance. Tiny probable infarct at inferior pole LEFT kidney on delayed images. Kidneys, ureters, and bladder otherwise normal appearance Stomach/Bowel: Stomach unremarkable. Post ileocecectomy with ileocolic anastomosis in the RIGHT mid abdomen. Bowel wall thickening of ileal loops in the RIGHT pelvis. No evidence of bowel obstruction or bowel dilatation. Remaining bowel loops normal in appearance. Vascular/Lymphatic: Atherosclerotic calcifications aorta without aneurysm. No adenopathy. Reproductive: Absent enhancement of the RIGHT lateral aspect of the lower uterus consistent with infarct, new since 08/26/2017. Fluid within endometrial canal. Adnexa unremarkable. Small focus of gas within vagina. Other: Scattered stranding of intra-abdominal tissue planes especially in the RIGHT mid abdomen in RIGHT pelvis consistent with preceding abdominal surgery. Two foci of extraluminal gas are seen within the mesentery medial to the ileocecal  anastomosis, decreased in number since previous exam. No discrete abscess collection. Gas identified within the ventral surgical wound at the level of the upper pelvis at site of known wound infection and corresponding to abnormality on prior CT. Musculoskeletal: Degenerative  disc disease changes lumbar spine. No acute osseous findings. IMPRESSION: Postsurgical changes from ileocecectomy with primary ileocolic anastomosis without evidence of abscess or leak. Edema of small bowel loops at distal ileum potentially related to surgery. Gas within ventral midline surgical wound corresponding to wound infection versus packing material as noted on prior exam. Small infarct at inferior pole LEFT kidney. RIGHT uterine infarct. Electronically Signed: By: Lavonia Dana M.D. On: 09/13/2017 12:05    Assessment:  The patient is a 51 y.o. woman with a history of cervical cancer s/p concurrent cisplatin and radiation (external beam and brachytherapy) from 11/2016 - 05/2017.  PET scan in 06/2017 revealed enlarging pulmonary nodules but no evidence of abdominal disease per patient report.  She is s/p right ileocolectomy on 08/31/2017 for a small bowel obstruction.  She presented with abdominal pain.  Abdomen and pelvic CT scan on 09/13/2017 revealed a small infarct in the inferior pole of the LEFT kidney and RIGHT uterine infarct.  Symptomatically, she has ongoing abdominal pain.  Exam reveals tender lower abdomen.   Plan:   1.  Hematology:  Etiology of infarcts unclear.  By imaging, she appears to have metastatic disease and is hypercoagulable.  Exam reveals no evidence of SBE.  She does have a history of "heart murmur".  Agree with transthoracic echocardiogram.  She may need a TEE.  No current evidence of vascular disease on imaging.     Work-up for vasculitis (ESR/CRP, ANA with reflex, ANCA, C3/C4, cryoglobulins).  Patient has known hepatitis C.  Limited hypercoagulable work-up (lupus anticoagulant, anticardiolipin antibodies, beta2-glycoprotein).  Consider short term anticoagulation for renal infarct.  Await gynecology consult.  Consider vascular consult.   2.  Oncology:  Patient appears to have metastatic disease.  Patient has planned follow-up imaging in 10/2017 to assess enlarging  pulmonary nodules.  Obtain records from Dr. Fleet Contras at [facility] [phone])   Thank you for allowing me to participate in TELLY BROBERG 's care.  I will follow herclosely with you while hospitalized and after discharge in the outpatient department.   Lequita Asal, MD  09/13/2017, 4:49 PM

## 2017-09-14 ENCOUNTER — Inpatient Hospital Stay (HOSPITAL_COMMUNITY)
Admit: 2017-09-14 | Discharge: 2017-09-14 | Disposition: A | Discharge status: Home / Self Care | Payer: Medicaid Other | Attending: Surgery | Admitting: Surgery

## 2017-09-14 ENCOUNTER — Encounter: Payer: Self-pay | Admitting: Surgery

## 2017-09-14 DIAGNOSIS — T8141XA Infection following a procedure, superficial incisional surgical site, initial encounter: Secondary | ICD-10-CM

## 2017-09-14 DIAGNOSIS — I5030 Unspecified diastolic (congestive) heart failure: Secondary | ICD-10-CM

## 2017-09-14 DIAGNOSIS — N280 Ischemia and infarction of kidney: Secondary | ICD-10-CM

## 2017-09-14 LAB — ECHOCARDIOGRAM COMPLETE
Height: 69 in
WEIGHTICAEL: 2363.33 [oz_av]

## 2017-09-14 LAB — CBC
HEMATOCRIT: 24.3 % — AB (ref 35.0–47.0)
Hemoglobin: 8.3 g/dL — ABNORMAL LOW (ref 12.0–16.0)
MCH: 33.1 pg (ref 26.0–34.0)
MCHC: 34.2 g/dL (ref 32.0–36.0)
MCV: 96.8 fL (ref 80.0–100.0)
PLATELETS: 182 10*3/uL (ref 150–440)
RBC: 2.51 MIL/uL — ABNORMAL LOW (ref 3.80–5.20)
RDW: 13.3 % (ref 11.5–14.5)
WBC: 3.4 10*3/uL — AB (ref 3.6–11.0)

## 2017-09-14 LAB — ANAEROBIC AND AEROBIC CULTURE

## 2017-09-14 LAB — SEDIMENTATION RATE: Sed Rate: 89 mm/hr — ABNORMAL HIGH (ref 0–30)

## 2017-09-14 MED ORDER — SODIUM CHLORIDE 0.9% FLUSH
10.0000 mL | Freq: Two times a day (BID) | INTRAVENOUS | Status: DC
  Administered 2017-09-14 – 2017-09-15 (×2): 10 mL

## 2017-09-14 MED ORDER — RIVAROXABAN 15 MG PO TABS
15.0000 mg | ORAL_TABLET | Freq: Two times a day (BID) | ORAL | Status: DC
  Administered 2017-09-14 – 2017-09-15 (×2): 15 mg via ORAL
  Filled 2017-09-14 (×3): qty 1

## 2017-09-14 MED ORDER — RIVAROXABAN 15 MG PO TABS
15.0000 mg | ORAL_TABLET | Freq: Once | ORAL | Status: AC
  Administered 2017-09-14: 15 mg via ORAL
  Filled 2017-09-14: qty 1

## 2017-09-14 MED ORDER — RIVAROXABAN 20 MG PO TABS: 20.0000 mg | ORAL_TABLET | Freq: Every day | ORAL | Status: DC

## 2017-09-14 NOTE — Progress Notes (Signed)
Gynecology Progress Note  Admission Date: 09/11/2017 Current Date: 09/14/2017  Joanna Hall is a 51 y.o. who was admitted for a  wound infection and uterine and renal infarcts. Her condition has been improving. Surgery is considering initiating anticoagulation therapy. She denies vaginal bleeding or discharge.    No obstetric history on file.   History complicated by: Patient Active Problem List   Diagnosis Date Noted  . Wound infection after surgery 09/12/2017  . Hypokalemia   . Hypomagnesemia   . Ileocolic anastomotic leak   . Small bowel obstruction ([place]) 08/27/2017   ROS and patient/family/surgical history, located on admission H&P note dated 09/11/2017, have been reviewed, and there are no changes except as noted below  Vitals:   09/13/17 1029 09/13/17 1216 09/13/17 2009 09/14/17 0357  BP: 111/69 122/66 (!) 105/57 124/72  Pulse: 74 82 84 81  Resp: 16 16 18 18   Temp: 98.1 F (36.7 C) 97.6 F (36.4 C) (!) 97.4 F (36.3 C) 98.6 F (37 C)  TempSrc: Oral Oral Oral Oral  SpO2: 100% 100% 100% 100%  Weight:      Height:       Temp:  [97.4 F (36.3 C)-98.6 F (37 C)] 98.6 F (37 C) (11/30 0357) Pulse Rate:  [81-84] 81 (11/30 0357) Resp:  [16-18] 18 (11/30 0357) BP: (105-124)/(57-72) 124/72 (11/30 0357) SpO2:  [100 %] 100 % (11/30 0357) I/O last 3 completed shifts: In: 5162.7 [P.O.:1800; I.V.:3189.7; IV Piggyback:173] Out: 1250 [Urine:1250] No intake/output data recorded.  Intake/Output Summary (Last 24 hours) at 09/14/2017 1115 Last data filed at 09/14/2017 0357 Gross per 24 hour  Intake 1481.67 ml  Output 950 ml  Net 531.67 ml     Current Vital Signs 24h Vital Sign Ranges  T 98.6 F (37 C) Temp  Avg: 97.9 F (36.6 C)  Min: 97.4 F (36.3 C)  Max: 98.6 F (37 C)  BP 124/72 BP  Min: 105/57  Max: 124/72  HR 81 Pulse  Avg: 82.3  Min: 81  Max: 84  RR 18 Resp  Avg: 17.3  Min: 16  Max: 18  SaO2 100 % Not Delivered SpO2  Avg: 100 %  Min: 100 %  Max: 100 %           24 Hour I/O Current Shift I/O  Time Ins Outs 11/29 0701 - 11/30 0700 In: 2267.7 [P.O.:600; I.V.:1572.7] Out: 950 [Urine:950] No intake/output data recorded.    Physical exam: General appearance: alert, cooperative and appears stated age Abdomen: midline incision, small erythema, staples in place. Approx 2cm wound being packed dry to dry.  and soft, non-tender; bowel sounds normal; no masses, no organomegaly GU: No gross VB Lungs: clear to auscultation bilaterally Heart: regular rate and rhythm and no MRGs Extremities: no redness or tenderness in the calves or thighs, no edema Skin: normal and no lesions Psych: appropriate  Labs:   Recent Labs  Lab 09/11/17 1932 09/12/17 0446  NA 133* 135  K 3.1* 3.2*  CL 100* 105  CO2 26 23  BUN 10 10  CREATININE 0.47 0.56  GLUCOSE 90 88   Recent Labs  Lab 09/11/17 1932 09/12/17 0446  WBC 7.0 5.9  HGB 10.4* 9.2*  HCT 29.6* 27.2*  PLT 293 253    Recent Labs  Lab 09/11/17 1932 09/11/17 2217 09/12/17 0446  CALCIUM 8.4*  --  8.0*  MG  --  1.2* 1.9   No results for input(s): INR, APTT in the last 168 hours.  Recent Labs  Lab 09/11/17 1932  ALKPHOS 217*  BILITOT 0.5  PROT 6.8  ALT 25  AST 30    Recent Labs  Lab 09/11/17 1932 09/12/17 0446  WBC 7.0 5.9  HGB 10.4* 9.2*  HCT 29.6* 27.2*  PLT 293 253   Recent Labs  Lab 09/11/17 1932 09/12/17 0446  NA 133* 135  K 3.1* 3.2*  CL 100* 105  CO2 26 23  BUN 10 10  CREATININE 0.47 0.56  CALCIUM 8.4* 8.0*  PROT 6.8  --   BILITOT 0.5  --   ALKPHOS 217*  --   ALT 25  --   AST 30  --   GLUCOSE 90 88    Radiology ECHO performed today, results pending.   Assessment & Plan:   Discussed with patient proper wound care. Instructed patient to shower daily and wash incision with soap gently. Recommended using a hair dryer to keep the incision dry. Continue packing dry to dry.   Surgery in considering anticoagulation for thrombotic events which is reasonable.    Instructed patient that she will need to follow up with [facility] at their [facility] clinic and that she will need to call to make an appointment. The phone number for that practice is in her discharge information

## 2017-09-14 NOTE — Progress Notes (Signed)
Dressing changed to midline abdominal incision, small amount of drainage yellow to clear in color, foul odor noted. No redness around wound. Dry gauze packed in open area, covered with dry 4x4 and ABD dressing and secured with paper tape.

## 2017-09-14 NOTE — Progress Notes (Signed)
ANTICOAGULATION CONSULT NOTE - Initial Consult  Pharmacy Consult for Rivaroxaban Indication: DVT  Allergies  Allergen Reactions  . Ketamine Other (See Comments)    Unknown but given during previous cancer treatment and untolerated.     Patient Measurements: Height: 5' 9"  (175.3 cm) Weight: 147 lb 11.3 oz (67 kg) IBW/kg (Calculated) : 66.2   Vital Signs: Temp: 98.3 F (36.8 C) (11/30 1245) Temp Source: Oral (11/30 1245) BP: 104/57 (11/30 1245) Pulse Rate: 94 (11/30 1245)  Labs: Recent Labs    09/11/17 1932 09/12/17 0446  HGB 10.4* 9.2*  HCT 29.6* 27.2*  PLT 293 253  CREATININE 0.47 0.56    Estimated Creatinine Clearance: 87.9 mL/min (by C-G formula based on SCr of 0.56 mg/dL).   Medical History: Past Medical History:  Diagnosis Date  . Anxiety   . Cancer ([place])    cervix  . Hepatitis C   . Hypertension     Medications:  Patient was not on any anticoagulants at home.   Assessment: 51 yo female admitted for wound infection and uterine/renal infarcts. Pharmacy has been consulted to dose Rivaroxaban.   Patient received two doses of enoxaparin 79m on 11/29 at [address] [address].    Plan:  Will start rivaroxaban 170mBID for 21 days followed by Rivaroxaban 206maily.   HanLendon KaharmD Pharmacy Resident 09/14/2017,3:23 PM

## 2017-09-14 NOTE — Progress Notes (Signed)
SURGICAL PROGRESS NOTE (cpt [phone])  Hospital Day(s): 2.   Post op day(s):  Joanna Hall   Interval History: Patient seen and examined, no acute events or new complaints overnight. Patient reports her RLQ pain continues to improve while she's been tolerating regular diet with +flatus and +BM without N/V and denies fever/chills, CP, or SOB.  Review of Systems:  Constitutional: denies fever, chills  HEENT: denies cough or congestion  Respiratory: denies any shortness of breath  Cardiovascular: denies chest pain or palpitations  Gastrointestinal: abdominal pain, N/V, and bowel function as per interval history Genitourinary: denies burning with urination or urinary frequency Musculoskeletal: denies pain, decreased motor or sensation Integumentary: denies any other rashes or skin discolorations except post-surgical wound as per interval history Neurological: denies HA or vision/hearing changes   Vital signs in last 24 hours: [min-max] current  Temp:  [97.4 F (36.3 C)-98.6 F (37 C)] 98.6 F (37 C) (11/30 0357) Pulse Rate:  [74-84] 81 (11/30 0357) Resp:  [16-18] 18 (11/30 0357) BP: (105-124)/(57-72) 124/72 (11/30 0357) SpO2:  [100 %] 100 % (11/30 0357)     Height: 5' 9"  (175.3 cm) Weight: 147 lb 11.3 oz (67 kg) BMI (Calculated): 21.8   Intake/Output this shift:  No intake/output data recorded.   Intake/Output last 2 shifts:  @IOLAST2SHIFTS @   Physical Exam:  Constitutional: alert, cooperative and no distress  HENT: normocephalic without obvious abnormality  Eyes: PERRL, EOM's grossly intact and symmetric  Neuro: CN II - XII grossly intact and symmetric without deficit  Respiratory: breathing non-labored at rest  Cardiovascular: regular rate and sinus rhythm  Gastrointestinal: soft and non-distended with minimal-/mild- RLQ abdominal tenderness to palpation, midline dressing c/d/i Musculoskeletal: UE and LE FROM, no edema or wounds, motor and sensation grossly intact, NT   Labs:  CBC  Latest Ref Rng & Units 09/12/2017 09/11/2017 09/04/2017  WBC 3.6 - 11.0 K/uL 5.9 7.0 4.5  Hemoglobin 12.0 - 16.0 g/dL 9.2(L) 10.4(L) 8.7(L)  Hematocrit 35.0 - 47.0 % 27.2(L) 29.6(L) 25.3(L)  Platelets 150 - 440 K/uL 253 293 202   CMP Latest Ref Rng & Units 09/12/2017 09/11/2017 09/05/2017  Glucose 65 - 99 mg/dL 88 90 101(H)  BUN 6 - 20 mg/dL 10 10 20   Creatinine 0.44 - 1.00 mg/dL 0.56 0.47 0.68  Sodium 135 - 145 mmol/L 135 133(L) 135  Potassium 3.5 - 5.1 mmol/L 3.2(L) 3.1(L) 3.6  Chloride 101 - 111 mmol/L 105 100(L) 107  CO2 22 - 32 mmol/L 23 26 23   Calcium 8.9 - 10.3 mg/dL 8.0(L) 8.4(L) 8.0(L)  Total Protein 6.5 - 8.1 g/dL - 6.8 -  Total Bilirubin 0.3 - 1.2 mg/dL - 0.5 -  Alkaline Phos 38 - 126 U/L - 217(H) -  AST 15 - 41 U/L - 30 -  ALT 14 - 54 U/L - 25 -   Imaging studies:  Echocardiogram (09/14/2017) Official/final report pending, though patient reports being told by tech study "looked good" without evidence of thrombus  Assessment/Plan: 51 y.o.femalewith 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              - continue heart-healthy diet  - outpatient gynecologic oncology  follow-up             - antibiotics (currently Zosyn) --> PO at discharge - continue to monitor abdominal exam and bowel function             - f/u official echo interpretation to assess for thrombus and valvular disease             - oncology and gynecology consultations and recommendations appreciated              - dry gauze packing/dressing changes BID + as needed, keep incision clean and dry             - considering therapeutic anticoagulation considering hypercoagulable state and risk of additional thromboembolic events despite non-elucidation of thromboembolic etiology/source - medical management of comorbidities  -- Marilynne Drivers. Rosana Hoes, MD, [place]: [facility] - Partnering for exceptional care. Office: [phone]

## 2017-09-15 LAB — CARDIOLIPIN ANTIBODIES, IGG, IGM, IGA
Anticardiolipin IgA: <9 APL U/mL (ref 0–11)
Anticardiolipin IgG: <9 GPL U/mL (ref 0–14)
Anticardiolipin IgM: 10 MPL U/mL (ref 0–12)

## 2017-09-15 LAB — MPO/PR-3 (ANCA) ANTIBODIES
ANCA Proteinase 3: <3.5 U/mL (ref 0.0–3.5)
Myeloperoxidase Abs: <9 U/mL (ref 0.0–9.0)

## 2017-09-15 LAB — C3 COMPLEMENT: C3 Complement: 148 mg/dL (ref 82–167)

## 2017-09-15 LAB — C4 COMPLEMENT: Complement C4, Body Fluid: 46 mg/dL — ABNORMAL HIGH (ref 14–44)

## 2017-09-15 LAB — ANA W/REFLEX: Anti Nuclear Antibody(ANA): NEGATIVE

## 2017-09-15 MED ORDER — RIVAROXABAN 15 MG PO TABS: 15.0000 mg | ORAL_TABLET | Freq: Two times a day (BID) | ORAL | 0 refills | Status: DC

## 2017-09-15 MED ORDER — RIVAROXABAN 20 MG PO TABS: 20.0000 mg | ORAL_TABLET | Freq: Every day | ORAL | 0 refills | Status: DC

## 2017-09-15 NOTE — Care Management Note (Signed)
Case Management Note  Patient Details  Name: Joanna Hall MRN: [id number] Date of Birth: 02/09/1966  Subjective/Objective:     [name] left [facility] before she could be presented with a coupon for Xarelto. Her nurse is attempting to contact her by phone regarding the Xarelto coupon.                Action/Plan:   Expected Discharge Date:  09/15/17               Expected Discharge Plan:     In-House Referral:     Discharge planning Services     Post Acute Care Choice:    Choice offered to:     DME Arranged:    DME Agency:     HH Arranged:    HH Agency:     Status of Service:     If discussed at [employer], dates discussed:    Additional Comments:  Justyn Boyson A, RN 09/15/2017, 12:29 PM

## 2017-09-15 NOTE — Progress Notes (Signed)
Left message for Joanna Hall to return call to this nurse regarding a coupon for her prescription.

## 2017-09-15 NOTE — Discharge Instructions (Signed)
In addition to included general instructions,  Diet: Resume home heart healthy diet.   Activity: No heavy lifting >20 pounds (children, pets, laundry, garbage) or strenuous activity until follow-up, but light activity and walking are encouraged. Do not drive or drink alcohol if taking narcotic pain medications.  Wound care: Change wound packing and dressing as instructed TWICE daily AND as needed. You may (and should) shower/get incision wet with water/soapy water and pat dry (do not rub incisions), but no baths or submerging incision underwater until follow-up. Be sure to remove dressing prior to showering and reapply after shower.  Medications: Resume all home medications AND Xarelto anticoagulation as per medication instructions. For mild to moderate pain: acetaminophen (Tylenol) or ibuprofen/naproxen (if no kidney disease). Combining Tylenol with alcohol can substantially increase your risk of causing liver disease. Narcotic pain medications, if prescribed, can be used for severe pain, though may cause nausea, constipation, and drowsiness. Do not combine Tylenol and Percocet (or similar) within a 6 hour period as Percocet (and similar) contain(s) Tylenol. If you do not need the narcotic pain medication, you do not need to fill the prescription.  Call office [phone]) at any time if any questions, worsening pain, fevers/chills, bleeding, drainage from incision site, or other concerns.

## 2017-09-16 NOTE — Discharge Summary (Signed)
Physician Discharge Summary  Patient ID: SHIRELLE TOOTLE MRN: [id number] DOB/AGE: 1966-10-15 51 y.o.  Admit date: 09/11/2017 Discharge date: 09/16/2017  Admission Diagnoses:  Discharge Diagnoses:  Active Problems:   Wound infection after surgery   Hypokalemia   Hypomagnesemia   Ileocolic anastomotic leak   Discharged Condition: fair  Hospital Course: 51 y.o. female presented to [facility] ED for acute onset of severe RLQ abdominal pain. Workup was found to be significant for AKI and non-contrast CT imaging (without oral or IV contrast) demonstrating post-surgical changes and again demonstrating multiple pulmonary nodules highly concerning for metastatic disease. Patient's pain overall improved, and decision was made to repeat CT imaging with both oral and IV contrast to reassess patient's recent ileo-colic anastomosis due to small foci of air adjacent to anastomosis. While ileo-colic anastomosis remained without extravasation or obstruction of contrast, visualized were infarcts to patient's Right uterus and Left inferior kidney. Oncology and gynecology were consulted. Though TTE (echocardiogram) did not reveal discrete thrombus (nor did CT, though contrast timing was not optimal for assessment of intra-aortic or arterial thrombus), decision was made that patient's risk of anticoagulation appears less than her risk for stroke or other potentially devastating thromboembolic events, considering her hypercoagulable state (attributed to likely metastatic cervical cancer) and anatomically varied infarction sites. Advancement of patient's diet and ambulation were well-tolerated. The remainder of patient's hospital course was essentially unremarkable, and discharge planning was initiated accordingly with patient safely able to be discharged home with appropriate discharge instructions, antibiotics, pain control, and outpatient gynecologic oncology and surgical follow-up after all of her and family's  questions were answered to their expressed satisfaction. Of note, patient was also provided a 30-day coupon for Xarelto to facilitate appropriate anticoagulation at no additional cost while workup pending.  Consults: hematology/oncology and gynecology  Significant Diagnostic Studies: radiology: CT scan: Right uterine infarct and small Left inferior pole renal infarct, enterocolonic anastomosis intact without extravasation or obstruction of contrast, 2 small punctate extraluminal air bubbles adjacent to anastomosis unchanged between first and second CT imaging studies during this admission  Treatments: IV hydration, antibiotics: Zosyn and anticoagulation: Xarelto  Discharge Exam: Blood pressure 126/69, pulse 76, temperature 98 F (36.7 C), temperature source Oral, resp. rate 20, height 5' 9"  (1.753 m), weight 147 lb 11.3 oz (67 kg), SpO2 100 %. General appearance: alert, cooperative and no distress GI: abdomen soft and non-distended with mild-/moderate- RLQ abdominal tenderness to deep palpation (much less than present upon ED presentation/admission), lower midline post-surgical incisional wound well-approximated without surrounding erythema or drainage except small amount of purulent drainage from focall area where 2 staples removed prior to current admission  Disposition: 01-Home or Self Care   Allergies as of 09/15/2017      Reactions   Ketamine Other (See Comments)   Unknown but given during previous cancer treatment and untolerated.       Medication List    STOP taking these medications   HYDROcodone-acetaminophen 5-325 MG tablet Commonly known as:  NORCO/VICODIN   ketorolac 10 MG tablet Commonly known as:  TORADOL   traMADol 50 MG tablet Commonly known as:  ULTRAM     TAKE these medications   acetaminophen 325 MG tablet Commonly known as:  TYLENOL Take 650 mg every 6 (six) hours as needed by mouth.   amLODipine 10 MG tablet Commonly known as:  NORVASC Take 10 mg daily  by mouth.   azithromycin 250 MG tablet Commonly known as:  ZITHROMAX TAKE 2 TABLETS BY MOUTH ON DAY 1  AND THEN 1 TABLET EVERYDAY FOR 4 DAYS   doxycycline 100 MG capsule Commonly known as:  VIBRAMYCIN Take 1 capsule (100 mg total) by mouth 2 (two) times daily.   gabapentin 300 MG capsule Commonly known as:  NEURONTIN Take 300 mg 3 (three) times daily by mouth.   ibuprofen 600 MG tablet Commonly known as:  ADVIL,MOTRIN Take 600 mg every 6 (six) hours by mouth.   ibuprofen 800 MG tablet Commonly known as:  ADVIL,MOTRIN TAKE ONE Tablet BY MOUTH EVERY 8 HOURS AS NEEDED FOR 5 DAYS   meloxicam 15 MG tablet Commonly known as:  MOBIC Take 15 mg by mouth daily.   multivitamin with minerals Tabs tablet Take 1 tablet daily by mouth.   ondansetron 8 MG disintegrating tablet Commonly known as:  ZOFRAN-ODT PLACE 1 Tablet ON TONGUE TO DISSOLVE EVERY 8 HOURS AS NEEDED FOR NAUSEA   oxyCODONE-acetaminophen 5-325 MG tablet Commonly known as:  PERCOCET/ROXICET Take 1-2 tablets by mouth every 4 (four) hours as needed for moderate pain.   pantoprazole 20 MG tablet Commonly known as:  PROTONIX Take 20 mg daily by mouth.   promethazine 25 MG suppository Commonly known as:  PHENERGAN Place 25 mg every 6 (six) hours as needed rectally for nausea or vomiting.   Rivaroxaban 15 MG Tabs tablet Commonly known as:  XARELTO Take 1 tablet (15 mg total) by mouth every 12 (twelve) hours.   rivaroxaban 20 MG Tabs tablet Commonly known as:  XARELTO Take 1 tablet (20 mg total) by mouth daily with supper. Start taking on:  10/05/2017   sertraline 50 MG tablet Commonly known as:  ZOLOFT Take 50 mg daily by mouth.   VENTOLIN HFA 108 (90 Base) MCG/ACT inhaler Generic drug:  albuterol INHALE 1-2 PUFFS BY MOUTH EVERY 4 HOURS AS NEEDED FOR WHEEZING / SHORTNESS OF BREATH      Follow-up Information    Mellody Drown, MD. Schedule an appointment as soon as possible for a visit on 09/19/2017.    Specialty:  Obstetrics and Gynecology Why:  Call Monday morning (12/3) to schedule an appointment for 12/5 at [facility] location. Contact information: [facility] [address] [place] [phone]        Vickie Epley, MD. Schedule an appointment as soon as possible for a visit in 1 week(s).   Specialty:  General Surgery Why:  Call Monday morning (12/3) to schedule appointment with Dr. Hampton Abbot or Dr. Rosana Hoes the end of this coming week. Contact information: [address] [place] [address] [phone]           Signed: Vickie Epley 09/16/2017, 5:09 PM

## 2017-09-17 ENCOUNTER — Telehealth: Payer: Self-pay

## 2017-09-17 ENCOUNTER — Telehealth: Payer: Self-pay | Admitting: Surgery

## 2017-09-17 LAB — LUPUS ANTICOAGULANT PANEL
DRVVT: 38.2 s (ref 0.0–47.0)
PTT Lupus Anticoagulant: 48.6 s (ref 0.0–51.9)

## 2017-09-17 LAB — BETA-2-GLYCOPROTEIN I ABS, IGG/M/A
Beta-2 Glyco I IgG: <9 GPI IgG units (ref 0–20)
Beta-2-Glycoprotein I IgA: <9 GPI IgA units (ref 0–25)
Beta-2-Glycoprotein I IgM: <9 GPI IgM units (ref 0–32)

## 2017-09-17 NOTE — Telephone Encounter (Signed)
error 

## 2017-09-17 NOTE — Telephone Encounter (Signed)
Called patient back, she's waiting on a referral to an oncologist from Dr. Rosana Hoes

## 2017-09-17 NOTE — Telephone Encounter (Signed)
Patient has called and left a message on voicemail at 1:11pm. Patient would like a return call and did not say what the call was in reference to. Please call patient back.

## 2017-09-17 NOTE — Telephone Encounter (Signed)
Per discharge instructions from [facility] hospital, all discharge information is on AVS.Discharge information in discharge summary is as follows: Follow-up Information    Mellody Drown, MD. Schedule an appointment as soon as possible for a visit on 09/19/2017.   Specialty:  Obstetrics and Gynecology Why:  Call Monday morning (12/3) to schedule an appointment for 12/5 at [facility] location. Contact information: [facility] [address] [place] [address] [phone]        Vickie Epley, MD. Schedule an appointment as soon as possible for a visit in 1 week(s).   Specialty:  General Surgery Why:  Call Monday morning (12/3) to schedule appointment with Dr. Hampton Abbot or Dr. Rosana Hoes the end of this coming week. Contact information: [address] [place] [address] [phone]     Please advise patient.

## 2017-09-18 ENCOUNTER — Other Ambulatory Visit: Payer: Self-pay

## 2017-09-18 DIAGNOSIS — C539 Malignant neoplasm of cervix uteri, unspecified: Secondary | ICD-10-CM | POA: Insufficient documentation

## 2017-09-18 DIAGNOSIS — D759 Disease of blood and blood-forming organs, unspecified: Secondary | ICD-10-CM | POA: Insufficient documentation

## 2017-09-18 DIAGNOSIS — N289 Disorder of kidney and ureter, unspecified: Secondary | ICD-10-CM | POA: Insufficient documentation

## 2017-09-18 DIAGNOSIS — R87619 Unspecified abnormal cytological findings in specimens from cervix uteri: Secondary | ICD-10-CM

## 2017-09-18 DIAGNOSIS — K3189 Other diseases of stomach and duodenum: Secondary | ICD-10-CM | POA: Insufficient documentation

## 2017-09-18 DIAGNOSIS — E860 Dehydration: Secondary | ICD-10-CM | POA: Insufficient documentation

## 2017-09-18 DIAGNOSIS — A0472 Enterocolitis due to Clostridium difficile, not specified as recurrent: Secondary | ICD-10-CM | POA: Insufficient documentation

## 2017-09-18 DIAGNOSIS — R945 Abnormal results of liver function studies: Secondary | ICD-10-CM

## 2017-09-18 DIAGNOSIS — E871 Hypo-osmolality and hyponatremia: Secondary | ICD-10-CM | POA: Insufficient documentation

## 2017-09-18 DIAGNOSIS — N739 Female pelvic inflammatory disease, unspecified: Secondary | ICD-10-CM | POA: Insufficient documentation

## 2017-09-18 DIAGNOSIS — B182 Chronic viral hepatitis C: Secondary | ICD-10-CM | POA: Insufficient documentation

## 2017-09-18 DIAGNOSIS — R1030 Lower abdominal pain, unspecified: Secondary | ICD-10-CM | POA: Insufficient documentation

## 2017-09-18 DIAGNOSIS — R7989 Other specified abnormal findings of blood chemistry: Secondary | ICD-10-CM | POA: Insufficient documentation

## 2017-09-18 DIAGNOSIS — C538 Malignant neoplasm of overlapping sites of cervix uteri: Secondary | ICD-10-CM | POA: Insufficient documentation

## 2017-09-18 DIAGNOSIS — R197 Diarrhea, unspecified: Secondary | ICD-10-CM | POA: Insufficient documentation

## 2017-09-18 HISTORY — DX: Enterocolitis due to Clostridium difficile, not specified as recurrent: A04.72

## 2017-09-18 HISTORY — DX: Unspecified abnormal cytological findings in specimens from cervix uteri: R87.619

## 2017-09-18 HISTORY — DX: Other diseases of stomach and duodenum: K31.89

## 2017-09-18 LAB — FACTOR 5 LEIDEN

## 2017-09-18 NOTE — Telephone Encounter (Signed)
I have called patient to go over her discharge information that was provided to her. No answer. I have left a message for patient to call back. Patient should contact: Mellody Drown, MD. Schedule an appointment as soon as possible for a visit on 09/19/2017.  Specialty: Obstetrics and Gynecology Why: Call Monday morning (12/3) to schedule an appointment for 12/5 at [facility] location. Contact information: [facility] [address] [place] [address] [phone]  This department, per the hospital notes, states that she should see this department for Gynecology Oncology.

## 2017-09-19 ENCOUNTER — Inpatient Hospital Stay: Payer: Medicaid - Out of State | Attending: Obstetrics and Gynecology | Admitting: Obstetrics and Gynecology

## 2017-09-19 ENCOUNTER — Encounter (INDEPENDENT_AMBULATORY_CARE_PROVIDER_SITE_OTHER): Payer: Self-pay

## 2017-09-19 VITALS — BP 136/86 | HR 97 | Temp 97.4°F | Resp 18 | Ht 69.0 in | Wt 151.7 lb

## 2017-09-19 DIAGNOSIS — C530 Malignant neoplasm of endocervix: Secondary | ICD-10-CM | POA: Diagnosis not present

## 2017-09-19 DIAGNOSIS — Z923 Personal history of irradiation: Secondary | ICD-10-CM | POA: Insufficient documentation

## 2017-09-19 DIAGNOSIS — Z79899 Other long term (current) drug therapy: Secondary | ICD-10-CM | POA: Insufficient documentation

## 2017-09-19 DIAGNOSIS — Z87891 Personal history of nicotine dependence: Secondary | ICD-10-CM | POA: Insufficient documentation

## 2017-09-19 DIAGNOSIS — Z9221 Personal history of antineoplastic chemotherapy: Secondary | ICD-10-CM | POA: Diagnosis not present

## 2017-09-19 DIAGNOSIS — C539 Malignant neoplasm of cervix uteri, unspecified: Secondary | ICD-10-CM

## 2017-09-19 DIAGNOSIS — N289 Disorder of kidney and ureter, unspecified: Secondary | ICD-10-CM | POA: Insufficient documentation

## 2017-09-19 DIAGNOSIS — I10 Essential (primary) hypertension: Secondary | ICD-10-CM

## 2017-09-19 DIAGNOSIS — R918 Other nonspecific abnormal finding of lung field: Secondary | ICD-10-CM | POA: Diagnosis not present

## 2017-09-19 DIAGNOSIS — B182 Chronic viral hepatitis C: Secondary | ICD-10-CM | POA: Diagnosis not present

## 2017-09-19 LAB — PROTHROMBIN GENE MUTATION

## 2017-09-19 NOTE — Telephone Encounter (Signed)
Patient's pathology results scanned to chart. Results can be found under the media tab.

## 2017-09-19 NOTE — Progress Notes (Signed)
  Oncology Nurse Navigator Documentation Chaperoned pelvic exam. Follow up with Dr. Fransisca Connors in 6 weeks. Navigator Location: [facility] (09/19/17 1500)   )Navigator Encounter Type: Initial GynOnc (09/19/17 1500)                     Patient Visit Type: GynOnc (09/19/17 1500)                              Time Spent with Patient: 30 (09/19/17 1500)

## 2017-09-19 NOTE — Progress Notes (Signed)
Gynecologic Oncology Consult Visit   Referring Provider: Dr. Nolon Stalls  Chief Concern: Stage IB1 cervical cancer s/p chemoradiation and recent ileocolectomy for bowel obstruction  Subjective:  Joanna Hall is a 51 y.o. female with cervical cancer and untreated Hepatitis C.  The patient was diagnosed with cervical adenocarcinoma in [place] in 09/2016.  She has had a long standing history of abnormal PAP smears.  She presented with an ovarian cyst.  Laparoscopic surgery was difficult secondary to scar tissue.  Had BSO and rupture of cyst with purulent drainage into abdomen.  Had Stage IB1 (4 cm) endocervical adenocarcinoma. Radical hysterectomy aborted.   She was treated by Dr Christene Slates at [facility] in [place], [place]. Decision was made to pursue concurrent chemotherapy (weekly cisplatin) and radiation.  She received treatment from 11/2016 - 05/2017.  01/2017 cisplatin x 2 and Carboplatin x 1 (01/29/17) due to ARF and XRT. XRT followed by T & O on 02/01/17 and T & N 4.28/18 and 02/20/17 . Course was complicated by weight loss (80 pounds), nausea, vomiting, electrolyte wasting (potassium and magnesium).  She describes being "sick constantly" and requiring at least 20 hospitalizations.   She underwent follow-up chest CT then PET scan in 06/2017.  She states that "the radiation worked" and there was no disease in the abdomen.  She was noted to have lung nodules that were growing.  She was scheduled to have follow-up imaging in 10/2017.  She was admitted in [place] with a small bowel obstruction.  She was managed conservatively.  She was home for about a week then traveled to [place] for Thanksgiving holiday (she has family here).  She presented on 08/27/2017 with nausea, vomiting, and lower abdominal pain.  Symptoms did not respond to conservative measurement.    Preoperative CT on 08/26/2017  revealed SBO with transition in the pelvis  just superior to the uterus where there was a long segment of distal ileum with fatty wall thickening compatible with chronic inflammation and/or radiation enteritis.  She underwent laparotomy and right ileocolectomy on 08/31/2017 at [facility].  There were numerous pulmonary nodules c/w metastatic disease.  Surgical findings revealed a thickened, matted, and scarred piece of distal small bowel close to the ileocecal valve.  Diet was slowly advanced.  She was discharged on 09/05/2017.  Abdominal and pelvic CT without contrast on 09/11/2017 revealed debris within anterior abdominal wall incision concerning for infection, versus packing material.   She is s/p ileocolectomy with expected postoperative changes and mild colonic ileus.  There were numerous pulmonary nodules highly concerning for metastatic disease and punctate nonobstructing nephrolithiasis. She was readmitted on 09/12/2017.  She describes the onset of lower abdominal pain on 09/09/2017.  Pain markedly increased in intensity on 09/11/2017.   Staples were removed and the wound packed.  She was started on doxycycline.  Abdomen and pelvic CT with contrast revealed postsurgical changes from ileocecectomy with primary ileocolic anastomosis without evidence of abscess or leak.  There was edema of small bowel loops at distal ileum potentially related to surgery.  There was gas within ventral midline surgical wound corresponding to wound infection versus packing material.  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  She denies any history of thrombosis.  She denies any family history of thrombosis.  She stopped smoking 10 years ago (1/4 ppd) and has a history of thoracotomy.  She denies any family history of rheumatologic problems/vasculitis/autoimmune disorders.  Thrombosis  work up showed she is heterozygous for Factor V Leiden and taking Chief Strategy Officer.  Problem List: Patient Active Problem List   Diagnosis  Date Noted  . Abnormal cervical Papanicolaou smear 09/18/2017  . Cervical cancer, FIGO stage IB1 ([place]) 09/18/2017  . Chronic hepatitis C without hepatic coma ([place]) 09/18/2017  . Cytopenia 09/18/2017  . Diarrhea 09/18/2017  . Elevated LFTs 09/18/2017  . Erosive gastropathy 09/18/2017  . Hyponatremia 09/18/2017  . Intestinal infection due to Clostridium difficile 09/18/2017  . Lower abdominal pain 09/18/2017  . 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 Malignant neoplasm of overlapping sites of cervix ([place]) 09/18/2017  . Pelvic inflammatory disease (PID) 09/18/2017  . Renal insufficiency 09/18/2017  . Wound infection after surgery 09/12/2017  . Hypokalemia   . Hypomagnesemia   . Ileocolic anastomotic leak   . Small bowel obstruction ([place]) 08/27/2017  . Cervical arthritis 07/18/2017  . Dysuria 06/20/2017  . Metastatic cancer ([place]) 05/12/2017  . Hepatitis, chronic ([place]) 05/05/2017  . Essential hypertension 03/15/2017  . Anemia in other chronic diseases classified elsewhere 03/01/2017  . Chemotherapy-induced neutropenia ([place]) 01/29/2017  . 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[place]) 09/25/2016    Past Medical History: Past Medical History:  Diagnosis Date  . Anxiety   . Arthritis   . 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 Cancer ([place])    cervix  . Hepatitis C   . Hypertension     Past Surgical History: Past Surgical History:  Procedure Laterality Date  . CHOLECYSTECTOMY    . LAPAROTOMY N/A 08/31/2017   Procedure: EXPLORATORY LAPAROTOMY for SBO, ileocolectomy, removal of piece of uterine wall;  Surgeon: [name], MD;  Location: [facility] ORS;  Service: General;  Laterality: N/A;  . OOPHORECTOMY       Family History: History reviewed. No pertinent family history.  Social History: Social History   Socioeconomic History  . Marital status: Legally Separated    Spouse name: Not on file  . Number of children: Not on file  . Years of education: Not on file  .  Highest education level: Not on file  Social Needs  . Financial resource strain: Not on file  . Food insecurity - worry: Not on file  . Food insecurity - inability: Not on file  . Transportation needs - medical: Not on file  . Transportation needs - non-medical: Not on file  Occupational History  . Not on file  Tobacco Use  . Smoking status: Former Smoker    Last attempt to quit: 10/16/2006    Years since quitting: 10.9  . Smokeless tobacco: Never Used  Substance and Sexual Activity  . Alcohol use: No    Frequency: Never  . Drug use: No  . Sexual activity: Not on file  Other Topics Concern  . Not on file  Social History Narrative  . Not on file    Allergies: Allergies  Allergen Reactions  . Ketamine Other (See Comments)    Unknown but given during previous cancer treatment and passed out after getting med     Current Medications: Current Outpatient Medications  Medication Sig Dispense Refill  . acetaminophen (TYLENOL) 325 MG tablet Take 650 mg every 6 (six) hours as needed by mouth.    . doxycycline (VIBRAMYCIN) 100 MG capsule Take 1 capsule (100 mg total) by mouth 2 (two) times daily. 14 capsule 0  . gabapentin (NEURONTIN) 300 MG capsule Take 300 mg 3 (three) times daily by mouth.    Marland Kitchen ibuprofen (  ADVIL,MOTRIN) 200 MG tablet Take 600 mg by mouth every 6 (six) hours as needed.    Marland Kitchen ibuprofen (ADVIL,MOTRIN) 600 MG tablet Take 600 mg every 6 (six) hours by mouth.    . Multiple Vitamin (MULTIVITAMIN WITH MINERALS) TABS tablet Take 1 tablet daily by mouth.    . oxyCODONE-acetaminophen (PERCOCET/ROXICET) 5-325 MG tablet Take 1-2 tablets by mouth every 4 (four) hours as needed for moderate pain. 10 tablet 0  . Rivaroxaban (XARELTO) 15 MG TABS tablet Take 1 tablet (15 mg total) by mouth every 12 (twelve) hours. 42 tablet 0  . sertraline (ZOLOFT) 50 MG tablet Take 50 mg daily by mouth.    . VENTOLIN HFA 108 (90 Base) MCG/ACT inhaler INHALE 1-2 PUFFS BY MOUTH EVERY 4 HOURS AS NEEDED  FOR WHEEZING / SHORTNESS OF BREATH  0  . amLODipine (NORVASC) 10 MG tablet Take 10 mg daily by mouth.    Marland Kitchen azithromycin (ZITHROMAX) 250 MG tablet TAKE 2 TABLETS BY MOUTH ON DAY 1 AND THEN 1 TABLET EVERYDAY FOR 4 DAYS  0  . ibuprofen (ADVIL,MOTRIN) 800 MG tablet TAKE ONE Tablet BY MOUTH EVERY 8 HOURS AS NEEDED FOR 5 DAYS  0  . meloxicam (MOBIC) 15 MG tablet Take 15 mg by mouth daily.  2  . ondansetron (ZOFRAN-ODT) 8 MG disintegrating tablet PLACE 1 Tablet ON TONGUE TO DISSOLVE EVERY 8 HOURS AS NEEDED FOR NAUSEA  1  . pantoprazole (PROTONIX) 20 MG tablet Take 20 mg daily by mouth.    . promethazine (PHENERGAN) 25 MG suppository Place 25 mg every 6 (six) hours as needed rectally for nausea or vomiting.    Derrill Memo ON 10/05/2017] rivaroxaban (XARELTO) 20 MG TABS tablet Take 1 tablet (20 mg total) by mouth daily with supper. (Patient not taking: Reported on 09/19/2017) 30 tablet 0  . traMADol (ULTRAM) 50 MG tablet Take 1 tablet by mouth 1 day or 1 dose.     No current facility-administered medications for this visit.     Review of Systems Positive for weight loss, night sweats, head aches, bruising, weakness, claudication, dyspnea, nausea, diarrhea, urinary incont, arm, leg and back pain. Otherwise negative.  Objective:  Physical Examination:  BP 136/86   Pulse 97   Temp (!) 97.4 F (36.3 C) (Tympanic)   Resp 18   Ht _0  (1.753 m)   Wt 151 lb 11.2 oz (68.8 kg)   BMI 22.40 kg/m    ECOG Performance Status: 1 - Symptomatic but completely ambulatory  General appearance: alert, cooperative and appears stated age HEENT:neck supple with midline trachea and thyroid without masses Lymph node survey: non-palpable, axillary, inguinal, supraclavicular Cardiovascular: regular rate and rhythm, no murmurs or gallops Respiratory: normal air entry, lungs clear to auscultation and no rales, rhonchi or wheezing Breast exam: not examined. Abdomen: no hernias. Midline incision with staples and small open  area 2 cm being packed and healing well. Back: inspection of back is normal Extremities: extremities normal, atraumatic, no cyanosis or edema Skin exam - normal coloration and turgor, no rashes, no suspicious skin lesions noted. Neurological exam reveals alert, oriented, normal speech, no focal findings or movement disorder noted.  Pelvic: exam chaperoned by nurse; Vulva normal, Vagina shows some moderate necrosis of upper vault, but no nodularity or odor. Bimanual/RV:  No masses or nodularity.         Assessment:  Joanna Hall is a 51 y.o. female diagnosed with probable recurrent stage IB1 cervical adenocarcinoma s/p aborted radical hysterectomy due to  infected adnexa 2/18.  Then had chemoradiation at [facility] with apparent complete response.  Developed bowel obstruction and had resection of terminal ileum and cecum 11/18 at [facility].  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  Numerous small lesions slightly increasing in size.    She has some upper vaginal vault necrosis, but this is not new and she had been advised to use hydrogen peroxide douches.  No evidence of recurrent cancer or infection now and necrosis is not severe enough to warrant hyperbaric therapy.   Medical co-morbidities complicating care:untreated Hepatitis C .  Plan:   Problem List Items Addressed This Visit    None    Visit Diagnoses    Malignant neoplasm of cervix, unspecified site [facility])    -  Primary      Discussed the case with Dr Mike Gip of Medical Oncology, who saw the patient in the hospital post op.  Plan is for the patient to follow up with general surgeon tomorrow for wound check and staple removal.  We will order CT/PET and she will follow up with Dr Mike Gip in about two weeks.  If there is further concern for recurrence in the chest or elsewhere she will have a CT directed biopsy.  Treatment would involve chemotherapy, with carboplatin/taxol +/- Avastin.  The biopsy could also be evaluated for  MSI and PD1 to determine if she would be a candidate for immune checkpoint inhibitor.  If biopsy was insufficient for molecular studies, this could be done using the original cervical biopsy from [facility].   Patient has known hepatitis C that has not been treated.  Will defer to Dr. Mike Gip regarding if and when this should be treated with Harvoni.  The patient's diagnosis, an outline of the further diagnostic and laboratory studies which will be required, the recommendation, and alternatives were discussed.  All questions were answered to the patient's satisfaction.  A total of 60 minutes were spent with the patient/family today; 25% was spent in education, counseling and coordination of care for cervical cancer.    Mellody Drown, MD   Dr. Nolon Stalls. [facility]  Dr. Fleet Contras at [facility] [phone])

## 2017-09-19 NOTE — Progress Notes (Signed)
Pt still with pain in vagina an lungs, stitches are very sore. Has wound and packing it right now.

## 2017-09-20 ENCOUNTER — Encounter: Payer: Self-pay | Admitting: Surgery

## 2017-09-20 ENCOUNTER — Ambulatory Visit (INDEPENDENT_AMBULATORY_CARE_PROVIDER_SITE_OTHER): Payer: Self-pay | Admitting: Surgery

## 2017-09-20 VITALS — BP 123/82 | HR 98 | Temp 97.8°F | Ht 69.0 in | Wt 151.0 lb

## 2017-09-20 DIAGNOSIS — Z09 Encounter for follow-up examination after completed treatment for conditions other than malignant neoplasm: Secondary | ICD-10-CM

## 2017-09-20 LAB — CRYOGLOBULIN

## 2017-09-20 MED ORDER — AMOXICILLIN-POT CLAVULANATE 875-125 MG PO TABS: 1.0000 | ORAL_TABLET | Freq: Two times a day (BID) | ORAL | 0 refills | Status: DC

## 2017-09-20 NOTE — Progress Notes (Signed)
09/20/2017  HPI: Patient is status post exploratory laparotomy with a right ileocolectomy on 11/16 for persistent small bowel obstruction.  Her postop course was complicated by wound infection which required opening some of her staples with drainage of purulent fluid which was sent for cultures.  At that point patient was complaining of significant abdominal pain in the right side and eventually CT scan with oral and IV contrast was obtained which showed small infarcts within the uterus and at the inferior pole of the left kidney.  She was seen by oncology at that point as well as gynecology has had follow-up already.  As part of her workup she is positive for factor V Leiden mutation and she is heterozygous for this.  She is currently on Xarelto.  But currently plans are for her to have a PET scan and chest CT to reevaluate her pulmonary nodules and follow-up with Dr. Mike Gip with oncology hematology.  Otherwise patient reports that she is doing better from the abdominal standpoint with much improved pain which is minimal at this point.  Denies any nausea or vomiting but does report having some loose stools.  She still doing dressing changes to the area of the wound that was open but this has been healing well and has had no further purulent output.  Vital signs: BP 123/82   Pulse 98   Temp 97.8 F (36.6 C) (Oral)   Ht 5' 9"  (1.753 m)   Wt 68.5 kg (151 lb)   BMI 22.30 kg/m    Physical Exam: Constitutional: No acute distress Abdomen: Soft, nondistended, nontender to palpation.  Midline incision closed with staples except for the lower portion which had been previously open for drainage.  There is mild erythema that appears to be more likely to be due to the staples.  The previously opened portion of the wound measures about 2.5 cm in length and about 2 cm in depth with very healthy granulation tissue and no purulent drainage.  Assessment/Plan: 51 year old female status post exploratory  laparotomy with right ileocolectomy.  -We will remove staples today and replaced by Steri-Strips.  Likely this is the source of the erythema and induration around the staples, however given the cultures being positive for E. Coli, enterococcus, and Klebsiella that were drug resistant to doxycycline, will give the patient as a precaution a one-week prescription for Augmentin to take twice daily.  She will also start Metamucil and probiotics to help with her loose stools. -Patient will continue packing the wound with dry gauze but will now change to once daily as the wound is healing well and there is no evidence of infection within. -Patient saw Dr. Fransisca Connors with gynecology oncology yesterday and currently has plans for a repeat PET scan as well as chest CT to further evaluate the patient's pulmonary nodules. -Patient is positive for factor V Leiden heterozygous.  Currently on Xarelto.  Will defer to further anticoagulation to Dr. Mike Gip.  She could require long-term anticoagulation given her mutation as well as hypercoagulable state from her malignancy.  Patient has an appointment with her next month. -Patient will follow-up with me in 2 weeks to assess her wound healing.    Melvyn Neth, [place] [facility]

## 2017-09-20 NOTE — Patient Instructions (Signed)
Please continue to change your dressing at least once a day.  Please start taking Metamucil and Probiotics once a day.  We will see you back in 2 weeks to make sure that you are doing better.   At this time you are able to start moving back to [place] with no problems.

## 2017-09-25 ENCOUNTER — Telehealth: Payer: Self-pay | Admitting: Surgery

## 2017-09-25 NOTE — Telephone Encounter (Signed)
Patient called and left a message said her incision around her belly has open and has some discharge, also has a smell to it. Please call patient and advice.

## 2017-09-25 NOTE — Telephone Encounter (Signed)
Patient called and states that incision site has a foul smell. She is currently on antibiotics but wants to ensure there is nothing to be concerned about. Please advise.

## 2017-09-25 NOTE — Telephone Encounter (Signed)
Spoke with patient at this time. She is currently moving from [place] and could not come to the office.  Patient stated she is still noticing an odor, however she is keeping the area clean  with soap and water and patting dry the steri strips.She is taking her Antibiotic and feels there has been some improvement.  She denies any redness or fever at this time. She stated she may be back from [place] later this week and will call the office if her wound odor worsens.  Patient instructed to keep area clean and dry and apply dressing to collect the drainage. Continue the antibiotic.

## 2017-09-27 ENCOUNTER — Encounter: Payer: Self-pay | Admitting: General Surgery

## 2017-09-27 ENCOUNTER — Ambulatory Visit (INDEPENDENT_AMBULATORY_CARE_PROVIDER_SITE_OTHER): Payer: Self-pay | Admitting: General Surgery

## 2017-09-27 VITALS — BP 127/82 | HR 102 | Temp 98.0°F | Ht 69.0 in | Wt 150.2 lb

## 2017-09-27 DIAGNOSIS — Z4889 Encounter for other specified surgical aftercare: Secondary | ICD-10-CM

## 2017-09-27 NOTE — Progress Notes (Signed)
Outpatient Surgical Follow Up  09/27/2017  Joanna Hall is an 51 y.o. female.   Chief Complaint  Patient presents with  . Routine Post Op    post op EXPLORATORY LAPAROTOMY for SBO, ileocolectomy 08/31/2017 Dr. Mack Hook pain and discharge from incision site    HPI: 51 year old female returns to clinic now 1 month status post exploratory laparotomy for bowel obstruction that required a bowel resection.  She reports that since her last visit to areas of her abdomen has continued to drain and robust open.  The Steri-Strips did not adhere.  She developed a foul odor earlier this week which prompted her to come in to be seen.  She had been packing one area but not the other.  She states otherwise she is eating well denies any fevers, chills, nausea, vomiting, chest pain, shortness of breath, diarrhea, constipation.  She has 1 dose of antibiotics left.  Past Medical History:  Diagnosis Date  . Anxiety   . Arthritis   . 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 Cancer ([place])    cervix  . Hepatitis C   . Hypertension   . 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 CHOLECYSTECTOMY    . LAPAROTOMY N/A 08/31/2017   Procedure: EXPLORATORY LAPAROTOMY for SBO, ileocolectomy, removal of piece of uterine wall;  Surgeon: [name], MD;  Location: [facility] ORS;  Service: General;  Laterality: N/A;  . OOPHORECTOMY      History reviewed. No pertinent family history.  Social History:  reports that she quit smoking about 10 years ago. she has never used smokeless tobacco. She reports that she does not drink alcohol or use drugs.  Allergies:  Allergies  Allergen Reactions  . Ketamine Other (See Comments)    Unknown but given during previous cancer treatment and passed out after getting med     Medications reviewed.    ROS A multipoint review of systems was completed.  All pertinent positives and negatives are  documented within the HPI and the remainder are negative.   BP 127/82   Pulse (!) 102   Temp 98 F (36.7 C) (Oral)   Ht 5' 9"  (1.753 m)   Wt 68.1 kg (150 lb 3.2 oz)   BMI 22.18 kg/m   Physical Exam General: No acute distress Chest: Clear to auscultation  heart: Tachycardic Abdomen: Soft, appropriately tender midline, nondistended.  There are 2 areas of opening that tunnel down to the fascia.  The fascia probes intact.  There is no spreading erythema or purulent discharge.      No results found for this or any previous visit (from the past 48 hour(s)). No results found.  Assessment/Plan:  1. Aftercare following surgery 51 year old female status post expiratory laparotomy for bowel obstruction.  2 open areas of her midline, however fascia probes intact.  Instructed her and showed her how to pack the areas with iodoform gauze.  Provided her with a container of iodoform herself.  She will return to doing this twice daily.  She will complete her oral antibiotics today and we will have her seen in clinic on Monday for an additional wound check.     Clayburn Pert, MD FACS General Surgeon  09/27/2017,11:37 AM

## 2017-09-27 NOTE — Patient Instructions (Signed)
Continue packing your wounds as directed. You should find yourself using less and less packing each day.   We will see you back in office as listed below:   Please give our office a call if you have any questions or concerns   Wound Packing Wound packing involves placing a moistened packing material into your wound and then covering it with an outer bandage (dressing). This helps promote proper healing of deep tissue and tissue under the skin. It also helps prevent bleeding, infection, and further injury. Wounds are packed until deep tissue has had time to heal. The time it takes for this to occur is different for everyone. Your health care provider will show you how to pack and dress your wound. Using gloves and a sterile technique is important in order to avoid spreading germs into your wound. What are the risks?  Infection.  Delayed or abnormal healing. How to pack your wound Follow your health care provider's instructions on how often you need to change dressings and pack your wound. You will likely be asked to change dressings 1-2 times a day. Supplies Needed  Gloves.  Wetting solution.  Clean bowl.  Packing material (gauze or gauze sponges).  Clean towels.  Outer dressing.  Tape.  Cotton balls or cotton-tipped swabs.  Small plastic bag. Preparing Your New Packing Material  1. Make sure your work surface or countertop is clean and disinfected. 2. Wash your hands well with soap and water. 3. Put a clean towel on the counter. 4. Put a clean bowl on the towel. Be sure to only touch the outside of the bowl when handling it. 5. Pour wetting solution into the bowl. 6. Cut your packing material (gauze or sponges) to the right size for your wound. Drop it into the bowl. 7. Cut four tape strips that you will use to seal the outer dressing. 8. Put cotton balls or cotton-tipped swabs on the clean towel. Removing the Old Packing and Dressing 1. Gently remove the old dressing  and packing material. 2. Put the removed items into the plastic bag to throw away later. 3. Wash your hands well with soap and water again. Applying New Packing Material and Dressing 1. Put on your gloves. 2. Squeeze the packing material in the bowl to release excess liquid. The packing material should be moist, but not dripping wet. 3. Gently place the packing material into the wound. Use a cotton ball or cotton-tipped swab to guide it into place, filling all of the space. 4. Dry your fingertips on the towel. 5. Open up your outer dressing supplies and put them on a dry part of the towel. Keep them from getting wet. 6. Place the dressing over the packed wound. 7. Tape the four outer edges of the dressing in place. 8. Remove your gloves. 9. Wash your hands again with soap and water. General Tips  Follow your health care provider's instructions on how tightly to pack the wound. At first, the wound will be packed tightly to help stop bleeding. As the wound begins to heal inside, you will use less packing material and pack the wound loosely to allow tissue to heal slowly from the inside out.  Keep the dressing clean and dry.  Follow any other instructions given by your health care provider on how to aid healing. This may include applying warm or cold compresses, elevating the affected area, or wearing a compression dressing.  Ask your health care provider about sun exposure and sunscreen when  the dressings are no longer needed.  Keep all follow-up visits with your health care provider. This is important. Contact a health care provider if:  You have drainage, redness, swelling, or pain at your wound site.  You notice a bad smell coming from the wound site.  Your pain is not controlled with pain medicine.  Tissue inside your wound changes color from pink to white, yellow, or black.  Your wound changes in size or depth.  You have a fever.  You have shaking chills.  You are having  trouble packing your wound. This information is not intended to replace advice given to you by your health care provider. Make sure you discuss any questions you have with your health care provider. Document Released: 04/29/2014 Document Revised: 04/21/2016 Document Reviewed: 11/26/2013 Elsevier Interactive Patient Education  [employer]. .

## 2017-10-01 ENCOUNTER — Encounter: Payer: Self-pay | Admitting: Surgery

## 2017-10-01 ENCOUNTER — Ambulatory Visit (INDEPENDENT_AMBULATORY_CARE_PROVIDER_SITE_OTHER): Payer: Self-pay | Admitting: Surgery

## 2017-10-01 VITALS — BP 114/78 | HR 98 | Temp 98.1°F | Ht 69.0 in | Wt 154.6 lb

## 2017-10-01 DIAGNOSIS — R197 Diarrhea, unspecified: Secondary | ICD-10-CM

## 2017-10-01 MED ORDER — TRAMADOL HCL 50 MG PO TABS: 50.0000 mg | ORAL_TABLET | Freq: Four times a day (QID) | ORAL | 0 refills | Status: DC | PRN

## 2017-10-01 NOTE — Progress Notes (Signed)
Surgical Clinic Progress/Follow-up Note   HPI:  51 y.o. 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 Patient reports she has been taking Xarelto as prescribed, has a supply for the next month, and says she'll be able to continue obtaining and taking Xarelto with a $5 co-payment per patient and her mom. She still complains of pain during BID dressing changes and also lower abdominal pain attributed to uterine infarct, requests extension of her Tramadol prescription. Despite narcotics and completing antibiotics last week, patient describes watery BM's x ~20/day, similar to prior c Diff. Patient states she thinks the plain gauze previously used for packing was absorbing her pink non-purulent/cloudy fluid better. Repeat CT/PET is scheduled for tomorrow.  Review of Systems:  Constitutional: denies any other weight loss, fever, chills, or sweats  Eyes: denies any other vision changes, history of eye injury  ENT: denies sore throat, hearing problems  Respiratory: denies shortness of breath, wheezing  Cardiovascular: denies chest pain, palpitations  Gastrointestinal: abdominal pain, N/V, and bowel function as per HPI Musculoskeletal: denies any other joint pains or cramps  Skin: Denies any other rashes or skin discolorations  Neurological: denies any other headache, dizziness, weakness  Psychiatric: denies any other depression, anxiety  All other review of systems: otherwise negative   Vital Signs:  BP 114/78   Pulse 98   Temp 98.1 F (36.7 C) (Oral)   Ht 5' 9"  (1.753 m)   Wt 154 lb 9.6 oz (70.1 kg)   BMI 22.83 kg/m    Physical Exam:  Constitutional:  -- Normal body habitus  -- Awake, alert,  and oriented x3  Eyes:  -- Pupils equally round and reactive to light  -- No scleral icterus  Ear, nose, throat:  -- No jugular venous distension  -- No nasal drainage, bleeding Pulmonary:  -- No crackles -- Equal breath sounds bilaterally -- Breathing non-labored at rest Cardiovascular:  -- S1, S2 present  -- No pericardial rubs  Gastrointestinal:  -- Soft and non-distended with post-surgical abdominal incision healing well except 2 previously opened wounds, both pink with healthy granulation tissue and no surrounding erythema or purulent drainage (only minimal serosanguinous fluid on removed gauze), mild-/moderate- LLQ and peri-incisional tenderness to palpation, no guarding/rebound  -- No abdominal masses appreciated, pulsatile or otherwise  Musculoskeletal / Integumentary:  -- Wounds or skin discoloration: None appreciated except post-surgical wounds as described in detail above (GI)  -- Extremities: B/L UE and LE FROM, hands and feet warm, no edema  Neurologic:  -- Motor function: intact and symmetric  -- Sensation: intact and symmetric   Laboratory studies:  CBC:  Lab Results  Component Value Date   WBC 3.4 (L) 09/14/2017   RBC 2.51 (L) 09/14/2017   BMP:  Lab Results  Component Value Date   GLUCOSE 88 09/12/2017   CO2 23 09/12/2017   BUN 10 09/12/2017   CREATININE 0.56 09/12/2017   CALCIUM 8.0 (L) 09/12/2017     Imaging: No new pertinent imaging studies available for review   Assessment:  51 y.o. yo Female with a problem list including...  Patient Active Problem List   Diagnosis Date Noted  . Abnormal cervical Papanicolaou smear 09/18/2017  . Cervical cancer, FIGO stage IB1 ([place]) 09/18/2017  . Chronic hepatitis C without  hepatic coma ([place]) 09/18/2017  . Cytopenia 09/18/2017  . Diarrhea 09/18/2017  . Elevated LFTs 09/18/2017  . Erosive gastropathy 09/18/2017  . Hyponatremia 09/18/2017  . Intestinal infection due to Clostridium difficile 09/18/2017  . Lower  abdominal pain 09/18/2017  . 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 Malignant neoplasm of overlapping sites of cervix ([place]) 09/18/2017  . Pelvic inflammatory disease (PID) 09/18/2017  . Renal insufficiency 09/18/2017  . Wound infection after surgery 09/12/2017  . Hypokalemia   . Hypomagnesemia   . Ileocolic anastomotic leak   . Small bowel obstruction ([place]) 08/27/2017  . Cervical arthritis 07/18/2017  . Dysuria 06/20/2017  . Metastatic cancer ([place]) 05/12/2017  . Hepatitis, chronic ([place]) 05/05/2017  . Essential hypertension 03/15/2017  . Anemia in other chronic diseases classified elsewhere 03/01/2017  . Chemotherapy-induced neutropenia ([place]) 01/29/2017  . 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 in context of prior cervical cancer treated with chemoradiation following aborted radical hysterectomy and discovered to be heterozygous for factor 5 Leiden mutation  Plan:   - pain control prn, will extend Tramadol prescription as requested considering context/circumstances, but encouraged to taper use of narcotics  - check c Diff --> if negative, may consider Imodium, etc s/p ileocecectomy for recent SBO  - offered referral for counseling/psychiatry for support considering recent death of patient's daughter, pain, and possibility of metastatic cervical CA  - return to clinic for follow-up with Dr. Hampton Abbot 1/7 as previously scheduled  - instructed to call office if any questions or concerns  - follow-up CT/PET imaging tomorrow  All of the above recommendations were discussed with the patient and patient's family, and all of patient's and family's questions were answered to their expressed satisfaction.  -- Marilynne Drivers Rosana Hoes, MD, [place]: [facility] - Partnering for exceptional care. Office: [phone]

## 2017-10-01 NOTE — Patient Instructions (Addendum)
Please stop by the lab today and pick up the stool kit. We will call you with the results once you bring back the specimen. Please try and do so as soon as possible.   Please pack the wound once daily or as needed.  Please be sure to stay hydrated.  Please see your follow up appointment with Dr.Piscoya later this week.

## 2017-10-02 ENCOUNTER — Encounter: Admission: RE | Admit: 2017-10-02 | Payer: Medicaid Other | Source: Ambulatory Visit

## 2017-10-02 ENCOUNTER — Other Ambulatory Visit
Admission: RE | Admit: 2017-10-02 | Discharge: 2017-10-02 | Disposition: A | Discharge status: Home / Self Care | Payer: Medicaid Other | Source: Ambulatory Visit | Attending: Surgery | Admitting: Surgery

## 2017-10-02 ENCOUNTER — Ambulatory Visit: Payer: Medicaid Other

## 2017-10-02 DIAGNOSIS — R197 Diarrhea, unspecified: Secondary | ICD-10-CM | POA: Insufficient documentation

## 2017-10-02 LAB — C DIFFICILE QUICK SCREEN W PCR REFLEX
C DIFFICILE (CDIFF) INTERP: NOT DETECTED
C DIFFICILE (CDIFF) TOXIN: NEGATIVE
C DIFFICLE (CDIFF) ANTIGEN: NEGATIVE

## 2017-10-03 ENCOUNTER — Encounter: Payer: Self-pay | Admitting: Surgery

## 2017-10-04 ENCOUNTER — Ambulatory Visit: Payer: Medicaid - Out of State | Admitting: Oncology

## 2017-10-06 ENCOUNTER — Encounter: Payer: Self-pay | Admitting: Surgery

## 2017-10-11 ENCOUNTER — Telehealth: Payer: Self-pay

## 2017-10-11 NOTE — Telephone Encounter (Signed)
Notified Ms. Day of appointment change from 1/3 to 1/4 at 1030 with Dr. Mike Gip. Read back performed. She is going to contact her physician in [place] and has imaging arranged there. Oncology Nurse Navigator Documentation  Navigator Location: [facility] (10/11/17 1300)   )Navigator Encounter Type: Telephone (10/11/17 1300) Telephone: Lahoma Crocker Call;Appt Confirmation/Clarification (10/11/17 1300)                                                  Time Spent with Patient: 15 (10/11/17 1300)

## 2017-10-12 ENCOUNTER — Ambulatory Visit: Payer: Medicaid Other

## 2017-10-17 ENCOUNTER — Telehealth: Payer: Self-pay

## 2017-10-17 NOTE — Telephone Encounter (Signed)
Spoke with patient at this time. She did not have the CT and PET scan done as scheduled due to not being covered by medicare.  She is scheduled to have CT and pet in [facility] 11/01/17 and will bring a disc with images for [place] to see. Patient will be coming to appointment tomorrow.

## 2017-10-17 NOTE — Telephone Encounter (Signed)
Patient called and stated that she is still having diarrhea going at least 20x a day. She stated that when she was last seen this is issue was not addressed. I asked if she was still taking the imodium she advised that she is. I asked if she had started a high fiber diet. She stated that she has not. I verbalized to her to give a high fiber diet a try along with the imodium. I advised her that she has the earliest appointment available at this time. Patient verbalized understanding.

## 2017-10-18 ENCOUNTER — Ambulatory Visit: Payer: Medicaid - Out of State | Admitting: Hematology and Oncology

## 2017-10-19 ENCOUNTER — Encounter: Payer: Self-pay | Admitting: Hematology and Oncology

## 2017-10-19 ENCOUNTER — Telehealth: Payer: Self-pay

## 2017-10-19 ENCOUNTER — Other Ambulatory Visit: Payer: Self-pay

## 2017-10-19 ENCOUNTER — Inpatient Hospital Stay: Payer: Medicaid Other

## 2017-10-19 ENCOUNTER — Inpatient Hospital Stay: Payer: Medicaid Other | Attending: Oncology | Admitting: Hematology and Oncology

## 2017-10-19 VITALS — BP 129/87 | HR 96 | Temp 96.7°F | Wt 161.9 lb

## 2017-10-19 DIAGNOSIS — Z7901 Long term (current) use of anticoagulants: Secondary | ICD-10-CM

## 2017-10-19 DIAGNOSIS — Z923 Personal history of irradiation: Secondary | ICD-10-CM | POA: Diagnosis not present

## 2017-10-19 DIAGNOSIS — C539 Malignant neoplasm of cervix uteri, unspecified: Secondary | ICD-10-CM

## 2017-10-19 DIAGNOSIS — I10 Essential (primary) hypertension: Secondary | ICD-10-CM | POA: Diagnosis not present

## 2017-10-19 DIAGNOSIS — Z87891 Personal history of nicotine dependence: Secondary | ICD-10-CM | POA: Diagnosis not present

## 2017-10-19 DIAGNOSIS — R197 Diarrhea, unspecified: Secondary | ICD-10-CM

## 2017-10-19 DIAGNOSIS — D6851 Activated protein C resistance: Secondary | ICD-10-CM | POA: Insufficient documentation

## 2017-10-19 DIAGNOSIS — Z8619 Personal history of other infectious and parasitic diseases: Secondary | ICD-10-CM

## 2017-10-19 DIAGNOSIS — Z79899 Other long term (current) drug therapy: Secondary | ICD-10-CM | POA: Diagnosis not present

## 2017-10-19 DIAGNOSIS — C530 Malignant neoplasm of endocervix: Secondary | ICD-10-CM | POA: Insufficient documentation

## 2017-10-19 DIAGNOSIS — Z9221 Personal history of antineoplastic chemotherapy: Secondary | ICD-10-CM | POA: Insufficient documentation

## 2017-10-19 DIAGNOSIS — B1920 Unspecified viral hepatitis C without hepatic coma: Secondary | ICD-10-CM | POA: Diagnosis not present

## 2017-10-19 DIAGNOSIS — R918 Other nonspecific abnormal finding of lung field: Secondary | ICD-10-CM | POA: Diagnosis not present

## 2017-10-19 DIAGNOSIS — Z7189 Other specified counseling: Secondary | ICD-10-CM | POA: Insufficient documentation

## 2017-10-19 DIAGNOSIS — B182 Chronic viral hepatitis C: Secondary | ICD-10-CM

## 2017-10-19 LAB — COMPREHENSIVE METABOLIC PANEL
ALK PHOS: 196 U/L — AB (ref 38–126)
ALT: 45 U/L (ref 14–54)
AST: 50 U/L — AB (ref 15–41)
Albumin: 3.1 g/dL — ABNORMAL LOW (ref 3.5–5.0)
Anion gap: 10 (ref 5–15)
BILIRUBIN TOTAL: 0.5 mg/dL (ref 0.3–1.2)
BUN: 14 mg/dL (ref 6–20)
CALCIUM: 8.3 mg/dL — AB (ref 8.9–10.3)
CO2: 24 mmol/L (ref 22–32)
CREATININE: 0.7 mg/dL (ref 0.44–1.00)
Chloride: 100 mmol/L — ABNORMAL LOW (ref 101–111)
Glucose, Bld: 84 mg/dL (ref 65–99)
Potassium: 3.7 mmol/L (ref 3.5–5.1)
Sodium: 134 mmol/L — ABNORMAL LOW (ref 135–145)
TOTAL PROTEIN: 7.1 g/dL (ref 6.5–8.1)

## 2017-10-19 LAB — GASTROINTESTINAL PANEL BY PCR, STOOL (REPLACES STOOL CULTURE)

## 2017-10-19 LAB — CBC WITH DIFFERENTIAL/PLATELET
BASOS PCT: 0 %
Basophils Absolute: 0 10*3/uL (ref 0–0.1)
EOS ABS: 0.1 10*3/uL (ref 0–0.7)
EOS PCT: 3 %
HCT: 32.1 % — ABNORMAL LOW (ref 35.0–47.0)
Hemoglobin: 10.7 g/dL — ABNORMAL LOW (ref 12.0–16.0)
LYMPHS ABS: 1 10*3/uL (ref 1.0–3.6)
Lymphocytes Relative: 28 %
MCH: 33.5 pg (ref 26.0–34.0)
MCHC: 33.3 g/dL (ref 32.0–36.0)
MCV: 100.6 fL — ABNORMAL HIGH (ref 80.0–100.0)
Monocytes Absolute: 0.2 10*3/uL (ref 0.2–0.9)
Monocytes Relative: 6 %
NEUTROS PCT: 63 %
Neutro Abs: 2.2 10*3/uL (ref 1.4–6.5)
PLATELETS: 207 10*3/uL (ref 150–440)
RBC: 3.19 MIL/uL — AB (ref 3.80–5.20)
RDW: 16.4 % — ABNORMAL HIGH (ref 11.5–14.5)
WBC: 3.4 10*3/uL — AB (ref 3.6–11.0)

## 2017-10-19 LAB — C DIFFICILE QUICK SCREEN W PCR REFLEX
C DIFFICILE (CDIFF) TOXIN: NEGATIVE
C DIFFICLE (CDIFF) ANTIGEN: NEGATIVE
C Diff interpretation: NOT DETECTED

## 2017-10-19 NOTE — Progress Notes (Signed)
[facility] day:  10/19/2017  Chief Complaint: Joanna Hall is a 52 y.o. female with a history of cervical cancer s/p chemoradiation who is seen for follow-up after recent hospitalization.  HPI:  The patient was diagnosed with cervical cancer in [place] in 09/2016.  She has had a long standing history of abnormal PAP smears.  She presented with an ovarian cyst.  Laparoscopic surgery was difficult secondary to scar tissue.  The cyst ruptured.  Biopsy material confirmed cervical cancer. She had a BSO and rupture of cyst with purulent drainage into abdomen. Radical hysterectomy was aborted.  She had stage IB1 (4 cm) endocervical adenocarcinoma.  She was treated by Dr Christene Slates at [facility] in [place], [place].  Decision was made to pursue concurrent chemotherapy (weekly cisplatin) and radiation.  She received treatment from 11/2016 - 05/2017.  She received both external beam radiation and brachytherapy.  In 01/2017, she received cisplatin x 2 and carboplatin x 1 (01/29/2017) due to ARF and XRT. Radiation was followed by T & O on 02/01/2017 and T & N on 02/10/2017 and 02/20/2017 .  Course was complicated by weight loss (80 pounds), nausea, vomiting, electrolyte wasting (potassium and magnesium).  She describes being "sick constantly" and requiring at least 20 hospitalizations.  She underwent follow-up chest CT then PET scan in 06/2017.  She states that "the radiation worked" and there was no disease in the abdomen.  She was noted to have lung nodules that were growing.  She was scheduled to have follow-up imaging in 10/2017.  She was admitted in [place] with a small bowel obstruction.  She was managed conservatively.  She was home for about a week then traveled to [place] for Thanksgiving holiday (she has family here).  She presented on 08/27/2017 with nausea, vomiting, and lower abdominal pain.   Symptoms did not respond to conservative measurement.    She was admitted to [facility] from 08/26/2017 - 09/05/2017.  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    She underwent laparotomy and right ileocolectomy on 08/31/2017.  There were numerous pulmonary nodules c/w metastatic disease.  Surgical findings revealed a thickened, matted, and scarred piece of distal small bowel close to the ileocecal valve.  Diet was slowly advanced.  She was discharged on 09/05/2017.  She noted drainage from the lower part of her incision beginning 09/10/2017.  Staples were removed and the wound packed.  She was started on doxycycline.  She was readmitted to [facility] from 09/11/2017 - 09/16/2017.  She describes the onset of lower abdominal pain on 09/09/2017.  Pain markedly increased in intensity on 09/11/2017.  She noted loose stools.  She has also had right sided abdominal pain x 2 weeks.  Abdominal and pelvic CT without contrast on 09/11/2017 revealed debris within anterior abdominal wall incision concerning for infection, versus packing material.   She is s/p ileocolectomy with expected postoperative changes and mild colonic ileus.  There were numerous pulmonary nodules highly concerning for metastatic disease and punctate nonobstructing nephrolithiasis.  Abdomen and pelvic CT with contrast on 09/13/2017 revealed postsurgical changes from ileocecectomy with primary ileocolic anastomosis without evidence of abscess or leak.  There was edema of small bowel loops at distal ileum potentially related to surgery.  There was gas  within ventral midline surgical wound corresponding to wound infection versus packing material.  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  She denies any history of  thrombosis.  She denies any family history of thrombosis.  She stopped smoking 10 years ago (1/4 ppd).  She denies any family history of rheumatologic problems/vasculitis/autoimmune disorders.  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  Testing was negative for prothrombin gene mutation, lupus anticoagulant panel, anticardiolipin antibodies, and beta-2 glycoprotein antibodies.  She was discharged on Xarelto.  She was seen by Dr. Fransisca Connors on 09/19/2017.  She was felt to have stage IB1 cervical adenocarcinoma s/p surgery, chemotherapy and radiation.  She had some upper vaginal vault necrosis, but this was not new and she had been advised to use hydrogen peroxide douches.   There was no evidence of recurrent cancer or infection and necrosis was not severe enough to warrant hyperbaric therapy.  Plan was for the patient to follow up with general surgeon for wound check and staple removal.  CT/PET was ordered.  If there was further concern for recurrence in the chest or elsewhere she would have a CT directed biopsy.  Treatment would involve chemotherapy, with carboplatin/Taxol +/- Avastin.  The biopsy could also be evaluated for MSI and PD1 to determine if she would be a candidate for immune checkpoint inhibitor.  If biopsy was insufficient for molecular studies, this could be done using the original cervical biopsy from [facility].   She has known hepatitis C that has not been treated.  Treated with Harvoni was to be discussed.  Symptomatically, patient has severe diarrhea. She notes that her diarrhea has been going on since her surgery. Patient has a history of past C.diff infections x 2. She notes that her diarrhea is adversely affecting her sleep at night. Patient eating well. She notes an insatiable appetite because "nothing stays in me long". Patient is not losing weight. In fact, her weight has increased by 7 pounds since 10/01/2017.  Patient  moved to [facility] from [place] 2 weeks prior to Christmas. She has planned CT/PET imaging in [place] soon.  She continues on Xarelto. Patient denies bleeding; no hematochezia, melena, or vaginal bleeding. Patient is scheduled to see Dr. Tama High on 10/22/2017 for continued post-operative care.    Past Medical History:  Diagnosis Date  . Anxiety   . Arthritis   . 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 Cancer ([place])    cervix  . Hepatitis C   . Hypertension   . 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 CHOLECYSTECTOMY    . LAPAROTOMY N/A 08/31/2017   Procedure: EXPLORATORY LAPAROTOMY for SBO, ileocolectomy, removal of piece of uterine wall;  Surgeon: [name], MD;  Location: [facility] ORS;  Service: General;  Laterality: N/A;  . OOPHORECTOMY      History reviewed. No pertinent family history.  Social History:  reports that she quit smoking about 11 years ago. she has never used smokeless tobacco. She reports that she does not drink alcohol or use drugs.  The patient's 12 year old daughter died of alcoholism.  The patient is originally from [place].  She moved from [place] to [place] 2 weeks ago.  She is accompanied by Mariea Clonts (nurse navigator)  today.  Allergies:  Allergies  Allergen Reactions  .  Ketamine Other (See Comments)    Unknown but given during previous cancer treatment and passed out after getting med     Current Medications: Current Outpatient Medications  Medication Sig Dispense Refill  . acetaminophen (TYLENOL) 325 MG tablet Take 650 mg every 6 (six) hours as needed by mouth.    . gabapentin (NEURONTIN) 300 MG capsule Take 300 mg 3 (three) times daily by mouth.    . Multiple Vitamin (MULTIVITAMIN WITH MINERALS) TABS tablet Take 1 tablet daily by mouth.    . Rivaroxaban (XARELTO) 15 MG TABS tablet Take 1 tablet (15 mg total) by mouth every 12  (twelve) hours. 42 tablet 0  . rivaroxaban (XARELTO) 20 MG TABS tablet Take 1 tablet (20 mg total) by mouth daily with supper. 30 tablet 0  . sertraline (ZOLOFT) 50 MG tablet Take 50 mg daily by mouth.    . traMADol (ULTRAM) 50 MG tablet Take 1 tablet (50 mg total) by mouth every 6 (six) hours as needed. 30 tablet 0  . VENTOLIN HFA 108 (90 Base) MCG/ACT inhaler INHALE 1-2 PUFFS BY MOUTH EVERY 4 HOURS AS NEEDED FOR WHEEZING / SHORTNESS OF BREATH  0   No current facility-administered medications for this visit.     Review of Systems:  GENERAL:  Feels "ok".  No fevers or sweats.  Weight loss of 80 pounds since diagnosis of cervical cancer.  Weight gain of 7 pounds since 10/01/2017 PERFORMANCE STATUS (ECOG):  2 HEENT:  No visual changes, runny nose, sore throat, mouth sores or tenderness. Lungs: No shortness of breath or cough.  No hemoptysis. Cardiac:  Known heart murmur.  No chest pain, palpitations, orthopnea, or PND. GI:  Loose bowel movements since surgery (wears pad).  No vomiting, constipation, melena or hematochezia.  Hemorrhoids "raw". GU:  No urgency, frequency, dysuria, or hematuria. h/o recurrent UTIs. 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  No back pain.  No muscle tenderness. Extremities:  No pain or swelling. Skin:  No rashes or skin changes. Neuro:  No headache, numbness or weakness, balance or coordination issues. Endocrine:  No diabetes, thyroid issues, hot flashes or night sweats. Psych:  No mood changes, depression or anxiety. Pain:  No focal pain. Review of systems:  All other systems reviewed and found to be negative.  Physical Exam: Blood pressure 129/87, pulse 96, temperature (!) 96.7 F (35.9 C), temperature source Tympanic, weight 161 lb 14.4 oz (73.4 kg). GENERAL:  Slightly fatigued appearing woman lying comfortably on the medical unit in no acute distress.  She appears older than stated age. MENTAL STATUS:  Alert and oriented to person, place and  time. HEAD:  Long brown hair.  Normocephalic, atraumatic, face symmetric, no Cushingoid features. EYES:  Blue eyes.  Pupils equal round and reactive to light and accomodation.  No conjunctivitis or scleral icterus. ENT:  Oropharynx clear without lesion.  Tongue normal.  No upper teeth.  Mucous membranes moist.  RESPIRATORY:  Decreased breath sounds at the bases.  Clear to auscultation without rales, wheezes or rhonchi. CARDIOVASCULAR:  Regular rate and rhythm without murmur, rub or gallop. ABDOMEN:  Soft, slightly tender in midline without guarding or rebound tenderness.  Active bowel sounds and no hepatosplenomegaly.  No masses. SKIN:  Samll dressing upper abdomen removed.  Pink area with central area draining a small amount of serosangenous fluid. EXTREMITIES: No edema, no skin discoloration or tenderness.  No palpable cords. LYMPH NODES: No palpable cervical, supraclavicular, axillary or inguinal adenopathy  NEUROLOGICAL: Unremarkable. PSYCH:  Appropriate.   No visits with results within 3 Day(s) from this visit.  Latest known visit with results is:  Hospital Outpatient Visit on 10/02/2017  Component Date Value Ref Range Status  . C Diff antigen 10/02/2017 NEGATIVE  NEGATIVE Final  . C Diff toxin 10/02/2017 NEGATIVE  NEGATIVE Final  . C Diff interpretation 10/02/2017 No C. difficile detected.   Final    Assessment:  Joanna Hall is a 52 y.o. female with a history of stage IB1 cervical cancer s/p concurrent cisplatin and radiation (external beam and brachytherapy) from 11/2016 - 02/2017.    She was treated by Dr Christene Slates at [facility] in [place], [place].  In 01/2017, she received cisplatin x 2 and carboplatin x 1 (01/29/2017) due to ARF and XRT. Radiation was followed by T & O on 02/01/2017 and T & N on 02/10/2017 and 02/20/2017.  Course was complicated by weight loss (80 pounds), nausea, vomiting, electrolyte wasting (potassium and magnesium), and  multiple hospitalizations.  PET scan in 06/2017 revealed enlarging pulmonary nodules but no evidence of abdominal disease per patient report.  She is s/p right ileocolectomy on 08/31/2017 for a small bowel obstruction.  She presented with abdominal pain.  Abdomen and pelvic CT scan on 09/13/2017 revealed a small infarct in the inferior pole of the LEFT kidney and RIGHT uterine infarct.  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  Testing was negative for prothrombin gene mutation, lupus anticoagulant panel, anticardiolipin antibodies, and beta-2 glycoprotein antibodies.  She was discharged on Xarelto.  Symptomatically, she has diarrhea since right ileocolectomy.  Midline incision is healing. She has gained weight. Exam reveals a small pink area with central draining of a small amount of serosangenous fluid.   Plan:   1.  Labs today: C.diff, GI panel, CBC with diff, CMP.  2.  Discuss diarrhea temporally related to surgery.  Ensure no infectious etiology as patient has a history of C diff.  Await evaluation by surgery.   3.  Discuss metastatic cervical cancer diagnosis. Patient with enlarging lung nodules. She has scheduled imaging in [place] this month with Dr. Fleet Contras as she does not have active Medicaid in [place] yet. Will contact oncologist in [facility] to discuss the need for PET imaging as well, as patient notes that she only has CT scans ordered.  Dr. Fleet Contras at [facility] [phone]). 4.  Discuss anticoagulation. Patient has single mutation Factor V Leiden and likely active malignancy.  Continue Xarelto.  5.  Follow up with Dr. Rosana Hoes on 10/22/2017 as already scheduled.  6.  Discuss hepatitis C management at next visit. 7.  Encourage obtaining a PCP in [place]. 8.  RTC after imaging in [place] (anticipated 11/01/2017) and follow up with surgery - patient will call for an appointment.   Honor Loh, NP  10/19/2017, 10:54 AM   I saw and evaluated the patient, participating in the key portions of the service and reviewing pertinent diagnostic studies and records.  I reviewed the nurse practitioner's note and agree with the findings and the plan.  The assessment and plan were discussed with the patient.  Additional diagnostic studies of a PET scan are needed to assess disease status and would change the clinical management.  Multiple questions were asked by the patient and answered.   Nolon Stalls, MD 10/19/2017,10:54 AM

## 2017-10-19 NOTE — Telephone Encounter (Signed)
Voicemail left with Dr. Garnetta Buddy office to return call regarding need for PET vs. CT chest. Ms. Gootee needs to have this done in [facility] as her medicaid is still in that state. They are currently rescheduling her scans for 11/08/17. Will follow up with them regarding imaging request. Oncology Nurse Navigator Documentation  Navigator Location: [facility] (10/19/17 1100)   )Navigator Encounter Type: Telephone (10/19/17 1100) Telephone: [place] Call (10/19/17 1100)                                                  Time Spent with Patient: 15 (10/19/17 1100)

## 2017-10-22 ENCOUNTER — Other Ambulatory Visit
Admission: RE | Admit: 2017-10-22 | Discharge: 2017-10-22 | Disposition: A | Discharge status: Home / Self Care | Payer: Medicaid Other | Source: Ambulatory Visit | Attending: Surgery | Admitting: Surgery

## 2017-10-22 ENCOUNTER — Encounter: Payer: Self-pay | Admitting: Surgery

## 2017-10-22 ENCOUNTER — Ambulatory Visit (INDEPENDENT_AMBULATORY_CARE_PROVIDER_SITE_OTHER): Payer: Self-pay | Admitting: Surgery

## 2017-10-22 VITALS — BP 163/102 | HR 84 | Temp 98.2°F | Ht 69.0 in | Wt 160.2 lb

## 2017-10-22 DIAGNOSIS — Z09 Encounter for follow-up examination after completed treatment for conditions other than malignant neoplasm: Secondary | ICD-10-CM

## 2017-10-22 DIAGNOSIS — R197 Diarrhea, unspecified: Secondary | ICD-10-CM | POA: Diagnosis not present

## 2017-10-22 MED ORDER — TRAMADOL HCL 50 MG PO TABS: 50.0000 mg | ORAL_TABLET | Freq: Four times a day (QID) | ORAL | 0 refills | Status: DC | PRN

## 2017-10-22 MED ORDER — DIPHENOXYLATE-ATROPINE 2.5-0.025 MG PO TABS: 2.0000 | ORAL_TABLET | Freq: Four times a day (QID) | ORAL | 0 refills | Status: DC | PRN

## 2017-10-22 NOTE — Patient Instructions (Signed)
We have sent you home with a new prescription please take as direct along with your imodium.   We advise that you start taking  Metamucli fiber 1-2 times daily.  We have also sent over an order for you to have a stool text completed at the medical mall.  We will see you back as listed below:

## 2017-10-22 NOTE — Progress Notes (Signed)
10/22/2017  HPI: Patient is status post exploratory laparotomy on 11/16 for small bowel obstruction.  She required a right ileocolectomy due to the significance of the adhesions the proximity of her dysfunctional small bowel to the ileocecal valve.  Postop course was complicated with abdominal pain which on CT scan was found to have small infarcts within the uterus and at the inferior pole of the left kidney.  She has been on Xarelto and was found to have on hypercoagulable workup factor V Leiden mutation.  She is being followed by Dr. Mike Gip.  There is also concern for possible metastatic cervical cancer and she is said to have repeat imaging studies in [place], [place] later this month.  She presents today for follow-up.  She reports that she still having significant amount of diarrhea every day multiple times a day which has led to some incontinence episodes.  She also reports having some abdominal pain and that her incision still has one area that is draining serosanguineous fluid.  Denies having any worsening infection over the incision.  She is eating and has been trying Imodium for her diarrhea but this has not helped.  Vital signs: BP (!) 163/102   Pulse 84   Temp 98.2 F (36.8 C) (Oral)   Ht 5' 9"  (1.753 m)   Wt 72.7 kg (160 lb 3.2 oz)   BMI 23.66 kg/m    Physical Exam: Constitutional: No acute distress Abdomen: Soft, nondistended, with appropriate tenderness to palpation at the incision level.  Her incision is almost healed with only 2 small areas that are mildly open superficially.  Silver nitrate was applied to these 2 areas with no complications.  Was covered with dry gauze and tape.  Assessment/Plan: 52 year old female status post exploratory laparotomy and right ileocolectomy.  Discussed with the patient that her stool studies have been negative for C. difficile as well as any bacteria or viral disease that could be contributing to her diarrhea.  In light of this it  is okay to continue trying Imodium.  We will also add a prescription of Lomotil and have discussed with her that she can take fiber such as Metamucil twice daily to help with her diarrhea.  We will also put an order to send her stool for testing for fat malabsorption.  Given that she did have a right ileocolectomy there could be a malabsorption component to her diarrhea and we will test for this.  If that is the case then we would update her on the results and give her the appropriate prescriptions.  Patient will follow-up in 2 weeks to assess her diarrhea as well as her wound check.  She understands this plan and all of her questions have been answered.   Melvyn Neth, [place]

## 2017-10-23 ENCOUNTER — Encounter: Payer: Self-pay | Admitting: Hematology and Oncology

## 2017-10-23 DIAGNOSIS — Z8619 Personal history of other infectious and parasitic diseases: Secondary | ICD-10-CM | POA: Insufficient documentation

## 2017-10-23 LAB — FECAL FAT, QUALITATIVE
Fat Qual Neutral, Stl: NORMAL
Fat Qual Total, Stl: NORMAL

## 2017-10-25 ENCOUNTER — Telehealth: Payer: Self-pay

## 2017-10-25 NOTE — Telephone Encounter (Signed)
Second call placed to Dr. Garnetta Buddy office at [facility] in [place]. Spoke with [place] at [phone]. Notified that Dr. Mellody Drown and Dr. Nolon Stalls would like a PET scan performed in [facility] instead of just a chest CT. She is moving to [place] and her medicaid is still in [facility] and she cannot have this scan performed here. Almyra Free returned call and verbalized that a PET has been arranged for 1/24. Oncology Nurse Navigator Documentation  Navigator Location: [facility] (10/25/17 0800)   )Navigator Encounter Type: Telephone (10/25/17 0800) Telephone: [place] Crocker Call (10/25/17 0800)                                                  Time Spent with Patient: 15 (10/25/17 0800)

## 2017-10-31 ENCOUNTER — Inpatient Hospital Stay (HOSPITAL_BASED_OUTPATIENT_CLINIC_OR_DEPARTMENT_OTHER): Payer: Medicaid Other | Admitting: Obstetrics and Gynecology

## 2017-10-31 ENCOUNTER — Inpatient Hospital Stay: Payer: Medicaid Other

## 2017-10-31 VITALS — BP 144/98 | HR 94 | Temp 97.7°F | Resp 18 | Ht 69.0 in | Wt 156.0 lb

## 2017-10-31 DIAGNOSIS — C539 Malignant neoplasm of cervix uteri, unspecified: Secondary | ICD-10-CM

## 2017-10-31 DIAGNOSIS — Z923 Personal history of irradiation: Secondary | ICD-10-CM

## 2017-10-31 DIAGNOSIS — R918 Other nonspecific abnormal finding of lung field: Secondary | ICD-10-CM

## 2017-10-31 DIAGNOSIS — B1920 Unspecified viral hepatitis C without hepatic coma: Secondary | ICD-10-CM

## 2017-10-31 DIAGNOSIS — C530 Malignant neoplasm of endocervix: Secondary | ICD-10-CM | POA: Diagnosis not present

## 2017-10-31 DIAGNOSIS — Z9221 Personal history of antineoplastic chemotherapy: Secondary | ICD-10-CM

## 2017-10-31 NOTE — Progress Notes (Signed)
Gynecologic Oncology Consult Visit   Referring Provider: Dr. Nolon Stalls  Chief Concern: Stage IB1 cervical cancer s/p chemoradiation with recent ileocolectomy for bowel obstruction.  Concern for lung recurrence.  Subjective:  Joanna Hall is a 52 y.o. female with cervical cancer and untreated Hepatitis C.  Patient continues to have diarrhea despite Lomotil and Imodium with 15 watery BMs per day.  Dr. Hampton Abbot who did the bowel resection has been managing this.  PET scan ordered to evaluate enlarging lung nodules with concern for recurrent cervical cancer. Scan not done yet due to insurance issues and she will have to get the study done in [place].   History The patient was diagnosed with cervical adenocarcinoma in [place] in 09/2016.  She has had a long standing history of abnormal PAP smears.  She presented with an ovarian cyst.  Laparoscopic surgery was difficult secondary to scar tissue.  Had BSO and rupture of cyst with purulent drainage into abdomen.  Had Stage IB1 (4 cm) endocervical adenocarcinoma. Radical hysterectomy aborted.   She was treated by Dr Christene Slates at [facility] in [place], [place]. Decision was made to pursue concurrent chemotherapy (weekly cisplatin) and radiation.  She received treatment from 11/2016 - 05/2017.  01/2017 cisplatin x 2 and Carboplatin x 1 (01/29/17) due to ARF and XRT. XRT followed by T & O on 02/01/17 and T & N 4.28/18 and 02/20/17 . Course was complicated by weight loss (80 pounds), nausea, vomiting, electrolyte wasting (potassium and magnesium).  She describes being "sick constantly" and requiring at least 20 hospitalizations.   She underwent follow-up chest CT then PET scan in 06/2017.  She states that "the radiation worked" and there was no disease in the abdomen.  She was noted to have lung nodules that were growing.  She was scheduled to have follow-up imaging in 10/2017.  She was admitted in  [place] with a small bowel obstruction.  She was managed conservatively.  She was home for about a week then traveled to [place] for Thanksgiving holiday (she has family here).  She presented on 08/27/2017 with nausea, vomiting, and lower abdominal pain.  Symptoms did not respond to conservative measurement.    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  She underwent laparotomy and right ileocolectomy on 08/31/2017 at [facility].  There were numerous pulmonary nodules c/w metastatic disease.  Surgical findings revealed a thickened, matted, and scarred piece of distal small bowel close to the ileocecal valve.  Diet was slowly advanced.  She was discharged on 09/05/2017.  Abdominal and pelvic CT without contrast on 09/11/2017 revealed debris within anterior abdominal wall incision concerning for infection, versus packing material.   She is s/p ileocolectomy with expected postoperative changes and mild colonic ileus.  There were numerous pulmonary nodules highly concerning for metastatic disease and punctate nonobstructing nephrolithiasis. She was readmitted on 09/12/2017.  She describes the onset of lower abdominal pain on 09/09/2017.  Pain markedly increased in intensity on 09/11/2017.   Staples were removed and the wound packed.  She was started on doxycycline.  Abdomen and pelvic CT with contrast revealed postsurgical changes from ileocecectomy with primary ileocolic anastomosis without evidence of abscess or leak.  There was edema of small bowel loops at distal ileum potentially related to surgery.  There was  gas within ventral midline surgical wound corresponding to wound infection versus packing material.  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  She denies any history of thrombosis.  She denies any family history of thrombosis.  She stopped smoking 10 years ago (1/4 ppd) and has a history of thoracotomy.  She denies any family history of rheumatologic problems/vasculitis/autoimmune disorders.  Thrombosis work up showed she is heterozygous for Factor V Leiden and taking Xaralto.  Problem List: Patient Active Problem List   Diagnosis Date Noted  . History of Clostridium difficile infection 10/23/2017  . Factor V Leiden ([place]) 10/19/2017  . Goals of care, counseling/discussion 10/19/2017  . Abnormal cervical Papanicolaou smear 09/18/2017  . Cervical cancer, FIGO stage IB1 ([place]) 09/18/2017  . Chronic hepatitis C without hepatic coma ([place]) 09/18/2017  . Cytopenia 09/18/2017  . Diarrhea 09/18/2017  . Elevated LFTs 09/18/2017  . Erosive gastropathy 09/18/2017  . Hyponatremia 09/18/2017  . Intestinal infection due to Clostridium difficile 09/18/2017  . Lower abdominal pain 09/18/2017  . 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 Malignant neoplasm of overlapping sites of cervix ([place]) 09/18/2017  . Pelvic inflammatory disease (PID) 09/18/2017  . Renal insufficiency 09/18/2017  . Wound infection after surgery 09/12/2017  . Hypokalemia   . Hypomagnesemia   . Ileocolic anastomotic leak   . Small bowel obstruction ([place]) 08/27/2017  . Cervical arthritis 07/18/2017  . Dysuria 06/20/2017  . Metastatic cancer ([place]) 05/12/2017  . Hepatitis, chronic ([place]) 05/05/2017  . Essential hypertension 03/15/2017  . Anemia in other chronic diseases classified elsewhere 03/01/2017  . Chemotherapy-induced neutropenia ([place]) 01/29/2017  . 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[place]) 09/25/2016    Past Medical History: Past Medical History:  Diagnosis Date  . Anxiety   . Arthritis   . 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 Cancer ([place])    cervix  . Hepatitis C   . Hypertension   . Infarction of kidney ([place]) left kidney    Past  Surgical History: Past Surgical History:  Procedure Laterality Date  . CHOLECYSTECTOMY    . LAPAROTOMY N/A 08/31/2017   Procedure: EXPLORATORY LAPAROTOMY for SBO, ileocolectomy, removal of piece of uterine wall;  Surgeon: [name], MD;  Location: [facility] ORS;  Service: General;  Laterality: N/A;  . OOPHORECTOMY       Family History: History reviewed. No pertinent family history.  Social History: Social History   Socioeconomic History  . Marital status: Legally Separated    Spouse name: Not on file  . Number of children: Not on file  . Years of education: Not on file  . Highest education level: Not on file  Social Needs  . Financial resource strain: Not on file  . Food insecurity - worry: Not on file  . Food insecurity - inability: Not on file  . Transportation needs - medical: Not on file  . Transportation needs - non-medical: Not on file  Occupational History  . Not on file  Tobacco Use  . Smoking status: Former Smoker    Last attempt to quit: 10/16/2006    Years since quitting: 11.0  . Smokeless tobacco: Never Used  Substance and Sexual Activity  . Alcohol use: No    Frequency: Never  . Drug use: No  . Sexual activity: Not on file  Other Topics Concern  . Not on file  Social History Narrative  . Not on file    Allergies: Allergies  Allergen  Reactions  . Ketamine Other (See Comments)    Unknown but given during previous cancer treatment and passed out after getting med     Current Medications: Current Outpatient Medications  Medication Sig Dispense Refill  . acetaminophen (TYLENOL) 325 MG tablet Take 650 mg every 6 (six) hours as needed by mouth.    . diphenoxylate-atropine (LOMOTIL) 2.5-0.025 MG tablet Take 2 tablets by mouth 4 (four) times daily as needed for diarrhea or loose stools. Titrate down after 48 hours 50 tablet 0  . gabapentin (NEURONTIN) 300 MG capsule Take 300 mg 3 (three) times daily by mouth.    . loperamide (IMODIUM A-D) 2 MG tablet Take 2  mg by mouth 4 (four) times daily as needed for diarrhea or loose stools.    . Multiple Vitamin (MULTIVITAMIN WITH MINERALS) TABS tablet Take 1 tablet daily by mouth.    . Rivaroxaban (XARELTO) 15 MG TABS tablet Take 1 tablet (15 mg total) by mouth every 12 (twelve) hours. 42 tablet 0  . rivaroxaban (XARELTO) 20 MG TABS tablet Take 1 tablet (20 mg total) by mouth daily with supper. 30 tablet 0  . VENTOLIN HFA 108 (90 Base) MCG/ACT inhaler INHALE 1-2 PUFFS BY MOUTH EVERY 4 HOURS AS NEEDED FOR WHEEZING / SHORTNESS OF BREATH  0  . sertraline (ZOLOFT) 50 MG tablet Take 50 mg daily by mouth.    . traMADol (ULTRAM) 50 MG tablet Take 1 tablet (50 mg total) by mouth every 6 (six) hours as needed. 30 tablet 0   No current facility-administered medications for this visit.     Review of Systems Per interval history.  Objective:  Physical Examination:  BP (!) 144/98   Pulse 94   Temp 97.7 F (36.5 C) (Tympanic)   Resp 18   Ht 5' 9"  (1.753 m)   Wt 156 lb (70.8 kg)   BMI 23.04 kg/m    ECOG Performance Status: 1 - Symptomatic but completely ambulatory  General appearance: alert, cooperative and appears stated age HEENT:neck supple with midline trachea and thyroid without masses Lymph node survey: non-palpable, axillary, inguinal, supraclavicular Cardiovascular: regular rate and rhythm, no murmurs or gallops Respiratory: normal air entry, lungs clear to auscultation and no rales, rhonchi or wheezing Breast exam: not examined. Abdomen: no hernias. Incision now well healed.  Nontender and not distended, with quiet bowel sounds and no mass.  Back: inspection of back is normal Extremities: extremities normal, atraumatic, no cyanosis or edema Skin exam - normal coloration and turgor, no rashes, no suspicious skin lesions noted. Neurological exam reveals alert, oriented, normal speech, no focal findings or movement disorder noted.  Pelvic: exam chaperoned by nurse; Vulva normal, Vagina shows some  mild to moderate necrosis of upper vault, but no nodularity or odor. Bimanual/RV:  No masses or nodularity.         Assessment:  Joanna Hall is a 52 y.o. female diagnosed with probable recurrent stage IB1 cervical adenocarcinoma s/p aborted radical hysterectomy due to infected adnexa 2/18 at [facility].  Then had chemoradiation at [facility] with apparent complete response.  Developed bowel obstruction and had resection of terminal ileum and cecum 11/18 at [facility].  Has significant diarrhea that is being managed medically by the surgeon who did the bowel resection.  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  Numerous small lesions slightly increasing in size.   She has some upper vaginal vault necrosis, but this is not new and she had been advised to use hydrogen peroxide douches.  No evidence of recurrent cancer or infection now and necrosis is not severe enough to warrant hyperbaric therapy.   Medical co-morbidities complicating care:untreated Hepatitis C .  Plan:   Problem List Items Addressed This Visit      Other   Cervical cancer, FIGO stage IB1 ([place]) - Primary      Discussed the case with Dr Mike Gip of Medical Oncology, who saw the patient in the hospital post op.    Plan is for the patient to follow up with general surgeon for continued management of diarrhea and referral to GI may be warranted if this does not get better soon.    CT/PET to be done in [facility] and then we will follow up on results and potential need for CT directed biopsy.  If she needs chemotherapy for recurrent disease she will follow up with Dr Mike Gip. Treatment would involve chemotherapy, with carboplatin/taxol +/- Avastin.  The biopsy could also be evaluated for MSI and PDL1 to determine if she would be a candidate for immune checkpoint inhibitor.  If biopsy was insufficient for molecular studies, this could be done using the original cervical biopsy from [facility].   Patient has known hepatitis C that has not  been treated, and this is being deferred until the status of her cancer becomes clearer.    The patient's diagnosis, an outline of the further diagnostic and laboratory studies which will be required, the recommendation, and alternatives were discussed.  All questions were answered to the patient's satisfaction.   Mellody Drown, MD   Dr. Nolon Stalls. [facility]  Dr. Fleet Contras at [facility] [phone])

## 2017-10-31 NOTE — Progress Notes (Signed)
Vaginal itching at times, some green discharge for a few days and then stop and this has been on and off.  She has stools 15-18 a day and using imodium and lomitl and sees surgeon-terrible smell to stools and urine is painful.

## 2017-11-01 ENCOUNTER — Telehealth: Payer: Self-pay | Admitting: Surgery

## 2017-11-01 ENCOUNTER — Other Ambulatory Visit
Admission: RE | Admit: 2017-11-01 | Discharge: 2017-11-01 | Disposition: A | Discharge status: Home / Self Care | Payer: Medicaid Other | Source: Ambulatory Visit | Attending: Surgery | Admitting: Surgery

## 2017-11-01 ENCOUNTER — Ambulatory Visit (INDEPENDENT_AMBULATORY_CARE_PROVIDER_SITE_OTHER): Payer: Self-pay | Admitting: Surgery

## 2017-11-01 ENCOUNTER — Encounter: Payer: Self-pay | Admitting: Surgery

## 2017-11-01 ENCOUNTER — Ambulatory Visit
Admission: RE | Admit: 2017-11-01 | Discharge: 2017-11-01 | Disposition: A | Discharge status: Home / Self Care | Payer: Medicaid Other | Source: Ambulatory Visit | Attending: Surgery | Admitting: Surgery

## 2017-11-01 VITALS — BP 152/108 | HR 84 | Temp 98.0°F | Ht 69.0 in | Wt 157.8 lb

## 2017-11-01 DIAGNOSIS — R197 Diarrhea, unspecified: Secondary | ICD-10-CM | POA: Diagnosis not present

## 2017-11-01 DIAGNOSIS — R918 Other nonspecific abnormal finding of lung field: Secondary | ICD-10-CM | POA: Insufficient documentation

## 2017-11-01 DIAGNOSIS — K6389 Other specified diseases of intestine: Secondary | ICD-10-CM | POA: Diagnosis not present

## 2017-11-01 DIAGNOSIS — R112 Nausea with vomiting, unspecified: Secondary | ICD-10-CM | POA: Diagnosis present

## 2017-11-01 DIAGNOSIS — Z9049 Acquired absence of other specified parts of digestive tract: Secondary | ICD-10-CM | POA: Insufficient documentation

## 2017-11-01 DIAGNOSIS — Z09 Encounter for follow-up examination after completed treatment for conditions other than malignant neoplasm: Secondary | ICD-10-CM

## 2017-11-01 LAB — COMPREHENSIVE METABOLIC PANEL
ALK PHOS: 227 U/L — AB (ref 38–126)
ALT: 56 U/L — ABNORMAL HIGH (ref 14–54)
ANION GAP: 10 (ref 5–15)
AST: 57 U/L — ABNORMAL HIGH (ref 15–41)
Albumin: 3.9 g/dL (ref 3.5–5.0)
BILIRUBIN TOTAL: 0.6 mg/dL (ref 0.3–1.2)
BUN: 11 mg/dL (ref 6–20)
CALCIUM: 9.5 mg/dL (ref 8.9–10.3)
CO2: 27 mmol/L (ref 22–32)
Chloride: 99 mmol/L — ABNORMAL LOW (ref 101–111)
Creatinine, Ser: 0.65 mg/dL (ref 0.44–1.00)
GFR calc non Af Amer: >60 mL/min (ref >60)
Glucose, Bld: 86 mg/dL (ref 65–99)
Potassium: 3.7 mmol/L (ref 3.5–5.1)
SODIUM: 136 mmol/L (ref 135–145)
TOTAL PROTEIN: 8.1 g/dL (ref 6.5–8.1)

## 2017-11-01 LAB — CBC WITH DIFFERENTIAL/PLATELET
BASOS ABS: 0 10*3/uL (ref 0–0.1)
BASOS PCT: 0 %
Eosinophils Absolute: 0.1 10*3/uL (ref 0–0.7)
Eosinophils Relative: 2 %
HEMATOCRIT: 36.3 % (ref 35.0–47.0)
HEMOGLOBIN: 12.2 g/dL (ref 12.0–16.0)
Lymphocytes Relative: 41 %
Lymphs Abs: 1.7 10*3/uL (ref 1.0–3.6)
MCH: 34.4 pg — ABNORMAL HIGH (ref 26.0–34.0)
MCHC: 33.6 g/dL (ref 32.0–36.0)
MCV: 102.4 fL — ABNORMAL HIGH (ref 80.0–100.0)
Monocytes Absolute: 0.2 10*3/uL (ref 0.2–0.9)
Monocytes Relative: 6 %
NEUTROS ABS: 2.2 10*3/uL (ref 1.4–6.5)
Neutrophils Relative %: 51 %
Platelets: 205 10*3/uL (ref 150–440)
RBC: 3.54 MIL/uL — ABNORMAL LOW (ref 3.80–5.20)
RDW: 16.9 % — ABNORMAL HIGH (ref 11.5–14.5)
WBC: 4.3 10*3/uL (ref 3.6–11.0)

## 2017-11-01 MED ORDER — RIVAROXABAN 15 MG PO TABS: 15.0000 mg | ORAL_TABLET | Freq: Two times a day (BID) | ORAL | 0 refills | Status: DC

## 2017-11-01 MED ORDER — CODEINE SULFATE 60 MG PO TABS: 60.0000 mg | ORAL_TABLET | Freq: Four times a day (QID) | ORAL | 0 refills | Status: DC | PRN

## 2017-11-01 MED ORDER — IOPAMIDOL (ISOVUE-300) INJECTION 61%
75.0000 mL | Freq: Once | INTRAVENOUS | Status: AC | PRN
  Administered 2017-11-01: 75 mL via INTRAVENOUS

## 2017-11-01 MED ORDER — OMEPRAZOLE 40 MG PO CPDR: 40.0000 mg | DELAYED_RELEASE_CAPSULE | Freq: Every day | ORAL | 0 refills | Status: DC

## 2017-11-01 NOTE — Telephone Encounter (Signed)
Patient returned my call at this and I verbalized to her that if she could get to the [facility] as soon as possible that they would see her for her CT Scan and for her labs to be drawn. I stated to patient that she would be held by the radiologist until report is given but once she is finished with her labs and CT to come on over to be seen at the office. She verbalized understanding at this time.

## 2017-11-01 NOTE — Patient Instructions (Signed)
Please pick up your medicines at the pharmacy.   Please see your follow up appointment listed below.

## 2017-11-01 NOTE — Telephone Encounter (Signed)
Patients calling said she is having a lot of pain when she's going to the bathroom, pain being about a seven.  Patient is also throwing up, and feeling nausea when using the restroom. patient said Dr. Hampton Abbot gave her some medication that helped for a couple days. Please call patient and advise.

## 2017-11-01 NOTE — Telephone Encounter (Signed)
Patient stated that no matter what she eats that she is having to rush to the bathroom to sue to having diarrhea and vomiting. I asked if she tried a bland diet and she says that she has but it doesn't change anything. She also stated that the Lomotil worked well for the few days but then her diarrhea came back. She stated that she is taking the fiber twice daily as well as the imodium but does not see a difference in her bowel movements.She states that she is in a lot of pain from having numenous amounts of bowel movements. I informed her that I would speak with the provider here in office and give her a call back with his suggestions.   Call made to Central Scheduling at this time. Spoke with Manuela Schwartz and she was able to schedule a Stat CT Scan of the Abdomen and Pelvic with contrast for 12PM. Advised me to let patient know that she will be sitting for 2 hours prior to being scanned.   Call made to patient at this time and I left a message advising her that Dr. Dahlia Byes is wanting her to have a CT Scan and some lab work completed and that if she gets these things completed today prior to 4PM that he will see her here in office. I told patient to please call back and verify that she received this message.

## 2017-11-02 ENCOUNTER — Ambulatory Visit: Payer: Self-pay | Admitting: General Surgery

## 2017-11-02 ENCOUNTER — Encounter: Payer: Self-pay | Admitting: Surgery

## 2017-11-02 NOTE — Progress Notes (Signed)
S/p  Right colectomy for SBO 08/31/17 Pt c/o diarrhea, has been tested Neg for C diff, lomotil and imodium added. I reviewed her CT scan showing patent anastomosis , no abscess and no acute abnormalities. ? Enteritis. She c/o of colicky abd pain and nausea. Labs are nml  PE NAD Abd: soft, incisions healed, no infection or peritonitis  A/p D/WE the pt about diarrhea I will add codeine. No need for surgical intervention or furthwr w/u, may need to see GI if this does not workl. F/U w Dr. Hampton Abbot as previously scheduled.

## 2017-11-14 ENCOUNTER — Encounter: Payer: Self-pay | Admitting: Surgery

## 2017-11-14 ENCOUNTER — Ambulatory Visit (INDEPENDENT_AMBULATORY_CARE_PROVIDER_SITE_OTHER): Payer: Self-pay | Admitting: Surgery

## 2017-11-14 VITALS — BP 148/93 | HR 72 | Temp 97.6°F | Ht 69.0 in | Wt 171.0 lb

## 2017-11-14 DIAGNOSIS — Z09 Encounter for follow-up examination after completed treatment for conditions other than malignant neoplasm: Secondary | ICD-10-CM

## 2017-11-14 MED ORDER — HYDROCORTISONE ACETATE 25 MG RE SUPP: 25.0000 mg | Freq: Two times a day (BID) | RECTAL | 0 refills | Status: DC

## 2017-11-14 MED ORDER — DIPHENOXYLATE-ATROPINE 2.5-0.025 MG PO TABS: 2.0000 | ORAL_TABLET | Freq: Four times a day (QID) | ORAL | 0 refills | Status: DC | PRN

## 2017-11-14 NOTE — Patient Instructions (Signed)
We have placed a referral today to [facility] in regards to your bowel movements. We will call you with an appointment as soon as it has been made. This process typically takes 5-7 business days. If you do not hear from our office after that time period, please give our office a call so that we may check on the status for you.  We have also sent in Anusol and Lomotil refill to [facility].  If you have any questions or concerns please give our office a call.

## 2017-11-14 NOTE — Progress Notes (Signed)
11/14/2017  HPI: Patient is status post exploratory laparotomy on 11/16 for small bowel obstruction.  She required a right ileocolectomy due to the significance of the adhesions the proximity of her dysfunctional small bowel to the ileocecal valve.  Postop course was complicated with abdominal pain which on CT scan was found to have small infarcts within the uterus and at the inferior pole of the left kidney.  She has been on Xarelto and was found to have on hypercoagulable workup factor V Leiden mutation.  She has been followed up by Gyn/Onc and had a PET/CT in [place] which appears stable.  Her main issue from the surgical standpoint had been a wound infection which has now resolved and her wound has healed, as well as ongoing diarrhea that has not improved since her surgery.  She is on Imodium and last visit we gave her Lomotil which helped for two days.  However, as she tried to taper the Lomotil, her diarrhea got worse again and now she's out of Lomotil.  With this, her hemorrhoids are also irritated.  Despite the diarrhea she says her abdominal pain is better and she has been eating and has actually gained some weight.  Vital signs: BP (!) 148/93   Pulse 72   Temp 97.6 F (36.4 C) (Oral)   Ht 5' 9"  (1.753 m)   Wt 77.6 kg (171 lb)   BMI 25.25 kg/m    Physical Exam: Constitutional: No acute distress Abdomen:  Soft, nondistended, nontender to palpation.  Midline incision is well healed now with no evidence of infection.  Assessment/Plan: 52 yo female s/p exploratory laparotomy with right ileocolectomy  --Will refill the patient's prescription for Lomotil --Will give prescription for Anusol suppository for hemorrhoids. --Will also refer to GI for management of her diarrhea.  On her last visit her fecal fat was normal.  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   She is gaining weight. --From surgical standpoint, may follow up with [place] PRN.   Melvyn Neth, [place]

## 2017-11-15 ENCOUNTER — Encounter: Payer: Self-pay | Admitting: Surgery

## 2017-11-15 ENCOUNTER — Telehealth: Payer: Self-pay | Admitting: General Practice

## 2017-11-15 MED ORDER — HYDROCORTISONE 2.5 % RE CREA: 1.0000 "application " | TOPICAL_CREAM | Freq: Two times a day (BID) | RECTAL | 0 refills | Status: DC

## 2017-11-15 NOTE — Telephone Encounter (Signed)
Patient calling said she was seen yesterday and Dr. Hampton Abbot prescribed her some medication yesterday for her hemorrhoids. patient said the medication was $150.00 and is asking if there is something else to be called into the pharmacy.please call patient and advise.

## 2017-11-15 NOTE — Telephone Encounter (Signed)
Called patient back to let her know that I had spoken to Dr. Hampton Abbot and he recommended for [place] to send her the Anusol cream and maybe this way her insurance will cove it. I also told her if her insurance didn't cover that, then we could send her a prescription for Tucks. I advised patient to give [place] a call back if she still couldn't afford the Anusol cream. Patient understood and had no further questions.

## 2017-11-20 ENCOUNTER — Telehealth: Payer: Self-pay

## 2017-11-20 DIAGNOSIS — C538 Malignant neoplasm of overlapping sites of cervix uteri: Secondary | ICD-10-CM

## 2017-11-20 DIAGNOSIS — R918 Other nonspecific abnormal finding of lung field: Secondary | ICD-10-CM

## 2017-11-20 DIAGNOSIS — C539 Malignant neoplasm of cervix uteri, unspecified: Secondary | ICD-10-CM

## 2017-11-20 NOTE — Telephone Encounter (Signed)
Voicemail left with [name] to return call. Both Dr. Fransisca Connors and Dr. Mike Gip have reviewed PET scan results. We will arrange for Ct chest in 3 months with follow up at that time with Dr. Mike Gip. She will see Dr. Fransisca Connors in 6 months. Orders placed for Ct and message sent to scheduling to arrange appointments. Oncology Nurse Navigator Documentation  Navigator Location: [facility] (11/20/17 1300)   )Navigator Encounter Type: Telephone (11/20/17 1300) Telephone: Joanna Hall Call;Appt Confirmation/Clarification (11/20/17 1300)                                                  Time Spent with Patient: 15 (11/20/17 1300)

## 2017-11-20 NOTE — Telephone Encounter (Signed)
Joanna Hall returned call. Updated on plan of care as previously documented. Oncology Nurse Navigator Documentation  Navigator Location: [facility] (11/20/17 1400)   )Navigator Encounter Type: Telephone (11/20/17 1400) Telephone: Incoming Call (11/20/17 1400)                                                  Time Spent with Patient: 15 (11/20/17 1400)

## 2017-11-27 ENCOUNTER — Encounter: Payer: Self-pay | Admitting: Gastroenterology

## 2017-11-27 ENCOUNTER — Ambulatory Visit: Payer: Medicaid Other | Admitting: Gastroenterology

## 2017-11-27 ENCOUNTER — Other Ambulatory Visit: Payer: Self-pay

## 2017-11-27 VITALS — BP 139/88 | HR 80 | Temp 97.7°F | Ht 69.0 in | Wt 158.6 lb

## 2017-11-27 DIAGNOSIS — K529 Noninfective gastroenteritis and colitis, unspecified: Secondary | ICD-10-CM

## 2017-11-27 DIAGNOSIS — B182 Chronic viral hepatitis C: Secondary | ICD-10-CM | POA: Diagnosis not present

## 2017-11-27 DIAGNOSIS — D539 Nutritional anemia, unspecified: Secondary | ICD-10-CM

## 2017-11-27 DIAGNOSIS — K9089 Other intestinal malabsorption: Secondary | ICD-10-CM

## 2017-11-27 DIAGNOSIS — K640 First degree hemorrhoids: Secondary | ICD-10-CM

## 2017-11-27 DIAGNOSIS — K601 Chronic anal fissure: Secondary | ICD-10-CM | POA: Diagnosis not present

## 2017-11-27 MED ORDER — DICYCLOMINE HCL 10 MG PO CAPS: 10.0000 mg | ORAL_CAPSULE | Freq: Three times a day (TID) | ORAL | 0 refills | Status: DC

## 2017-11-27 MED ORDER — CHOLESTYRAMINE 4 G PO PACK: 4.0000 g | PACK | Freq: Three times a day (TID) | ORAL | 0 refills | Status: DC

## 2017-11-27 NOTE — Progress Notes (Addendum)
[name], MD [address]  [place]  [place], [place] [address]  Main: [phone]  Fax: [phone]    Gastroenterology Consultation  Referring Provider:     [name], MD Primary Care Physician:  System, Pcp Not In Primary Gastroenterologist:  Dr. [name] Reason for Consultation:     Chronic diarrhea        HPI:   [name] is a 52 y.o. female referred by Dr. Hampton Abbot for consultation & management of and chronic unexplained diarrhea. Patient has history of cervical cancer underwent chemotherapy XRT and brachytherapy, obvious to be stable, closely followed by oncology at [facility]. She had small bowel obstruction in 08/2017, underwent exploratory laparoscopy with ileocolectomy and primary anastomosis. 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 Currently on Xarelto  Patient reports that since her bowel resection, she has been experiencing several episodes of nonbloody diarrhea, up to 12 times per day and night associated with urgency, nocturnal diarrhea as well as fecal incontinence. She does report diffuse lower abdominal cramps as well as intermittent gas/bloating.patient reports that she may have seen worms in her stools on several occasions. She does drink well water. She denies recent travel outside the [place] or recent use of antibiotics. She did have C. Difficile infection in the past when she was undergoing chemotherapy for cervical cancer. She had stool studies for C. Difficile and other GI pathogens including ova and parasites in 10/2017 by Dr. Hampton Abbot and they were negative.She has tried Imodium and Lomotil with no relief. Therefore she was referred to GI by Dr. Hampton Abbot for further management. Patient denies any particular relation to food. Prior to her surgery, patient reports having alternating episodes of diarrhea  and constipation associated with abdominal cramps and bloating. She would have up to 2-3 soft bowel movements daily but not this severe.her weight has been stable. She denies nausea, vomiting, fever, night sweats. She denies rectal bleeding. She is found to have microcytic anemia which has been stable. She is not on any B12 or iron supplements.   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 She does not have any evidence of chronic liver disease.  She denies smoking or drinking alcohol.  NSAIDs: she has been taking ibuprofen and Advil for abdominal pain, 3-4 times a week up to 600 mg daily  Antiplts/Anticoagulants/Anti thrombotics: on Xarelto for factor V bleeding mutation  GI Procedures:  Colonoscopy at [facility] 10/05/2016 Impression:           - One diminutive polyp in the cecum,                        removed with a cold biopsy forceps.                        Resected and retrieved.                       - One 5 mm polyp in the cecum,  removed with a jumbo cold forceps.                        Resected and retrieved.                       - One 6 mm polyp in the cecum,                        removed with a hot snare. Resected                        and retrieved.                       - One 5 mm polyp in the descending                        colon, removed with a jumbo cold                        forceps. Resected and retrieved. Date Collected: 10/05/2016 14:48 Diagnosis A.COLON, LABELED AS "CECAL POLYP", BIOPSY:  TUBULAR ADENOMA  SESSILE SERRATED ADENOMA  B.COLON, LABELED AS "TRANSVERSE COLON POLYP", BIOPSY:  TUBULAR ADENOMA  She denies family history of  celiac disease, autoimmune conditions, IBD, GI malignancy  Past Medical History:  Diagnosis Date  . Anxiety   . Arthritis   . 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 Cancer ([place])    cervix  . Hepatitis C   . Hypertension   . 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 CHOLECYSTECTOMY    . LAPAROTOMY N/A 08/31/2017   Procedure: EXPLORATORY LAPAROTOMY for SBO, ileocolectomy, removal of piece of uterine wall;  Surgeon: [name], MD;  Location: [facility] ORS;  Service: General;  Laterality: N/A;  . OOPHORECTOMY      Prior to Admission medications   Medication Sig Start Date End Date Taking? Authorizing Provider  acetaminophen (TYLENOL) 325 MG tablet Take 650 mg every 6 (six) hours as needed by mouth.   Yes [provider]  diphenoxylate-atropine (LOMOTIL) 2.5-0.025 MG tablet Take 2 tablets by mouth 4 (four) times daily as needed for diarrhea or loose stools. Titrate down after 48 hours 11/14/17  Yes Piscoya, Jose, MD  hydrocortisone (ANUSOL-HC) 2.5 % rectal cream Place 1 application rectally 2 (two) times daily. 11/15/17  Yes Piscoya, Jacqulyn Bath, MD  hydrocortisone (ANUSOL-HC) 25 MG suppository Place 1 suppository (25 mg total) rectally 2 (two) times daily. 11/14/17  Yes Piscoya, Jacqulyn Bath, MD  loperamide (IMODIUM A-D) 2 MG tablet Take 2 mg by mouth 4 (four) times daily as needed for diarrhea or loose stools.   Yes [provider]  Multiple Vitamin (MULTIVITAMIN WITH MINERALS) TABS tablet Take 1 tablet daily by mouth.   Yes [provider]  Rivaroxaban (XARELTO) 15 MG TABS tablet Take 1 tablet (15 mg total) by mouth every 12 (twelve) hours. 11/01/17  Yes Pabon, Diego F, MD  rivaroxaban (XARELTO) 20 MG TABS tablet Take 1 tablet (20 mg total) by mouth daily with supper. 10/05/17  Yes Vickie Epley, MD  sertraline (ZOLOFT) 50 MG tablet Take 50 mg daily by mouth.   Yes [provider]  VENTOLIN HFA  108 (90 Base) MCG/ACT inhaler INHALE 1-2  PUFFS BY MOUTH EVERY 4 HOURS AS NEEDED FOR WHEEZING / SHORTNESS OF BREATH 08/02/17  Yes [provider]    History reviewed. No pertinent family history.   Social History   Tobacco Use  . Smoking status: Former Smoker    Last attempt to quit: 10/16/2006    Years since quitting: 11.1  . Smokeless tobacco: Never Used  Substance Use Topics  . Alcohol use: No    Frequency: Never  . Drug use: No    Allergies as of 11/27/2017 - Review Complete 11/27/2017  Allergen Reaction Noted  . Ketamine Other (See Comments) and Anxiety 02/22/2017    Review of Systems:    All systems reviewed and negative except where noted in HPI.   Physical Exam:  BP 139/88   Pulse 80   Temp 97.7 F (36.5 C) (Oral)   Ht 5' 9"  (1.753 m)   Wt 158 lb 9.6 oz (71.9 kg)   BMI 23.42 kg/m  No LMP recorded. Patient is postmenopausal.  General:   Alert,  Well-developed, well-nourished, pleasant and cooperative in NAD Head:  Normocephalic and atraumatic. Eyes:  Sclera clear, no icterus.   Conjunctiva pink. Ears:  Normal auditory acuity. Nose:  No deformity, discharge, or lesions. Mouth:  No deformity or lesions,oropharynx pink & moist. Neck:  Supple; no masses or thyromegaly. Lungs:  Respirations even and unlabored.  Clear throughout to auscultation.   No wheezes, crackles, or rhonchi. No acute distress. Heart:  Regular rate and rhythm; no murmurs, clicks, rubs, or gallops. Abdomen:  Normal bowel sounds. Soft,mild diffuse lower abdominal tenderness, and non-distended without masses, hepatosplenomegaly or hernias noted.  No guarding or rebound tenderness.   Rectal: Not performed Msk:  Symmetrical without gross deformities. Good, equal movement & strength bilaterally. Pulses:  Normal pulses noted. Extremities:  No clubbing or edema.  No cyanosis. Neurologic:  Alert and oriented x3;  grossly normal neurologically. Skin:  Intact without significant lesions or rashes.  No jaundice. Lymph Nodes:  No significant cervical adenopathy. Psych:  Alert and cooperative. Normal mood and affect.  Imaging Studies: reviewed  Assessment and Plan:   CRISTEN BREDESON is a 52 y.o. female with history of chronic hepatitis C, treatment nave, hiscervical adenocarcinoma, status post chemotherapy XRT and brachytherapy, disease currently stable, persistent small bowel obstruction, S/P exploratory laparoscopy with ileocolectomy in 08/2017 seen in consultation for chronic hepatitis C, chronic unexplained diarrhea, chronic anal fissure as well as symptomatic hemorrhoids  Chronic hep C: unknown genotype, VL 37,404 as of 06/19/2017 There is no evidence of chronic liver disease She does have elevated transaminases and alkaline phosphatase She had alkaline phosphatase Isoenzymes and liver component is elevated, GGT 209, elevated Hepatitis B surface antigen negative HIV nonreactive Recheck hep C viral load, genotype Rule out other secondary causes for elevated LFTs, particularly elevated alkaline phosphatase  Chronic nonbloody diarrhea: Her history is consistent with bile salt induced diarrhea with history of ileocolectomy She may also have a component of small intestinal bacterial overgrowth. There differentials include NSAID-induced colitis/enteropathy - Start cholestyramine 4 g 2-3 times daily with meals - Dicyclomine for abdominal cramps - avoid NSAIDs - Check pancreatic fecal elastase, fecal calprotectin - Check CRP - low threshold to perform colonoscopy and upper endoscopy if she does not respond to cholestyramine  Chronic anal fissure and symptomatic hemorrhoids: - Recommend 0.125% nitroglycerine as well as Anusol cream or suppository, instructions provided - Also discussed with her about outpatient hemorrhoid ligation after healing of anal fissure, handouts given  Macrocytic  anemia: - Secondary to B12 deficiency, her B12 levels were low in 04/2017 - recheck vitamin  B12 levels, check ferritin, iron, TIBC, folate - Recommend starting B12 injections 1022mg weekly for 4-6 weeks followed by monthly  Follow up in 2 weeks   RCephas Darby MD

## 2017-11-28 ENCOUNTER — Other Ambulatory Visit: Payer: Self-pay | Admitting: Gastroenterology

## 2017-11-29 ENCOUNTER — Other Ambulatory Visit: Payer: Self-pay

## 2017-11-29 ENCOUNTER — Encounter: Payer: Self-pay | Admitting: Gastroenterology

## 2017-11-29 LAB — PANCREATIC ELASTASE, FECAL: Pancreatic Elastase, Fecal: >500 ug Elast./g (ref >200)

## 2017-11-29 MED ORDER — CYANOCOBALAMIN 1000 MCG/ML IJ SOLN: INTRAMUSCULAR | 1 refills | Status: DC

## 2017-11-29 MED ORDER — "SYRINGE 22G X 1"" 3 ML MISC": 0 refills | Status: DC

## 2017-11-29 MED ORDER — VITAMIN D (ERGOCALCIFEROL) 1.25 MG (50000 UNIT) PO CAPS: 50000.0000 [IU] | ORAL_CAPSULE | ORAL | 0 refills | Status: AC

## 2017-11-30 ENCOUNTER — Other Ambulatory Visit: Payer: Self-pay | Admitting: Gastroenterology

## 2017-11-30 LAB — IRON AND TIBC
Iron Saturation: 32 % (ref 15–55)
Iron: 107 ug/dL (ref 27–159)
TIBC: 334 ug/dL (ref 250–450)
UIBC: 227 ug/dL (ref 131–425)

## 2017-11-30 LAB — HCV RNA QUANT RFLX ULTRA OR GENOTYP
HCV QUANT BASELINE: 82400 [IU]/mL
HCV log10: 4.916 log10 IU/mL

## 2017-11-30 LAB — VITAMIN D 25 HYDROXY (VIT D DEFICIENCY, FRACTURES): VIT D 25 HYDROXY: 17.2 ng/mL — AB (ref 30.0–100.0)

## 2017-11-30 LAB — FERRITIN: FERRITIN: 950 ng/mL — AB (ref 15–150)

## 2017-11-30 LAB — FOLATE: Folate: 10.6 ng/mL (ref >3.0)

## 2017-11-30 LAB — HEPATITIS C GENOTYPE

## 2017-11-30 LAB — SEDIMENTATION RATE: SED RATE: 65 mm/h — AB (ref 0–40)

## 2017-11-30 LAB — C-REACTIVE PROTEIN: CRP: 1.6 mg/L (ref 0.0–4.9)

## 2017-11-30 LAB — CALPROTECTIN, FECAL: Calprotectin, Fecal: <16 ug/g (ref 0–120)

## 2017-11-30 LAB — VITAMIN B12: Vitamin B-12: 165 pg/mL — ABNORMAL LOW (ref 232–1245)

## 2017-12-01 ENCOUNTER — Encounter: Payer: Self-pay | Admitting: Gastroenterology

## 2017-12-02 ENCOUNTER — Encounter: Payer: Self-pay | Admitting: Gastroenterology

## 2017-12-03 ENCOUNTER — Emergency Department: Payer: Medicaid Other

## 2017-12-03 ENCOUNTER — Encounter: Payer: Self-pay | Admitting: Gastroenterology

## 2017-12-03 ENCOUNTER — Inpatient Hospital Stay
Admission: EM | Admit: 2017-12-03 | Discharge: 2017-12-08 | DRG: 394 | Disposition: A | Discharge status: Home / Self Care | Payer: Medicaid Other | Attending: Internal Medicine | Admitting: Internal Medicine

## 2017-12-03 ENCOUNTER — Encounter: Payer: Self-pay | Admitting: Emergency Medicine

## 2017-12-03 ENCOUNTER — Other Ambulatory Visit: Payer: Self-pay

## 2017-12-03 ENCOUNTER — Inpatient Hospital Stay: Payer: Medicaid Other

## 2017-12-03 DIAGNOSIS — E440 Moderate protein-calorie malnutrition: Secondary | ICD-10-CM | POA: Diagnosis present

## 2017-12-03 DIAGNOSIS — Z884 Allergy status to anesthetic agent status: Secondary | ICD-10-CM

## 2017-12-03 DIAGNOSIS — R918 Other nonspecific abnormal finding of lung field: Secondary | ICD-10-CM | POA: Diagnosis present

## 2017-12-03 DIAGNOSIS — R109 Unspecified abdominal pain: Secondary | ICD-10-CM | POA: Diagnosis present

## 2017-12-03 DIAGNOSIS — I10 Essential (primary) hypertension: Secondary | ICD-10-CM | POA: Diagnosis present

## 2017-12-03 DIAGNOSIS — D6851 Activated protein C resistance: Secondary | ICD-10-CM | POA: Diagnosis present

## 2017-12-03 DIAGNOSIS — Z8541 Personal history of malignant neoplasm of cervix uteri: Secondary | ICD-10-CM

## 2017-12-03 DIAGNOSIS — R1030 Lower abdominal pain, unspecified: Secondary | ICD-10-CM | POA: Diagnosis not present

## 2017-12-03 DIAGNOSIS — R1110 Vomiting, unspecified: Secondary | ICD-10-CM | POA: Diagnosis present

## 2017-12-03 DIAGNOSIS — C531 Malignant neoplasm of exocervix: Secondary | ICD-10-CM | POA: Diagnosis not present

## 2017-12-03 DIAGNOSIS — Z87891 Personal history of nicotine dependence: Secondary | ICD-10-CM | POA: Diagnosis not present

## 2017-12-03 DIAGNOSIS — Z90721 Acquired absence of ovaries, unilateral: Secondary | ICD-10-CM | POA: Diagnosis not present

## 2017-12-03 DIAGNOSIS — D419 Neoplasm of uncertain behavior of unspecified urinary organ: Secondary | ICD-10-CM | POA: Diagnosis not present

## 2017-12-03 DIAGNOSIS — Z79899 Other long term (current) drug therapy: Secondary | ICD-10-CM | POA: Diagnosis not present

## 2017-12-03 DIAGNOSIS — N280 Ischemia and infarction of kidney: Secondary | ICD-10-CM | POA: Diagnosis not present

## 2017-12-03 DIAGNOSIS — R1032 Left lower quadrant pain: Secondary | ICD-10-CM | POA: Diagnosis not present

## 2017-12-03 DIAGNOSIS — F419 Anxiety disorder, unspecified: Secondary | ICD-10-CM | POA: Diagnosis present

## 2017-12-03 DIAGNOSIS — K627 Radiation proctitis: Secondary | ICD-10-CM | POA: Diagnosis not present

## 2017-12-03 DIAGNOSIS — K529 Noninfective gastroenteritis and colitis, unspecified: Secondary | ICD-10-CM | POA: Diagnosis present

## 2017-12-03 DIAGNOSIS — Z7901 Long term (current) use of anticoagulants: Secondary | ICD-10-CM | POA: Diagnosis not present

## 2017-12-03 DIAGNOSIS — N858 Other specified noninflammatory disorders of uterus: Secondary | ICD-10-CM | POA: Diagnosis not present

## 2017-12-03 DIAGNOSIS — Z6822 Body mass index (BMI) 22.0-22.9, adult: Secondary | ICD-10-CM

## 2017-12-03 DIAGNOSIS — Z9221 Personal history of antineoplastic chemotherapy: Secondary | ICD-10-CM | POA: Diagnosis not present

## 2017-12-03 DIAGNOSIS — B1920 Unspecified viral hepatitis C without hepatic coma: Secondary | ICD-10-CM | POA: Diagnosis not present

## 2017-12-03 DIAGNOSIS — C530 Malignant neoplasm of endocervix: Secondary | ICD-10-CM | POA: Diagnosis not present

## 2017-12-03 DIAGNOSIS — Z923 Personal history of irradiation: Secondary | ICD-10-CM | POA: Diagnosis not present

## 2017-12-03 LAB — URINALYSIS, COMPLETE (UACMP) WITH MICROSCOPIC
BACTERIA UA: NONE SEEN
Bilirubin Urine: NEGATIVE
Glucose, UA: NEGATIVE mg/dL
HGB URINE DIPSTICK: NEGATIVE
Ketones, ur: NEGATIVE mg/dL
Nitrite: NEGATIVE
PROTEIN: NEGATIVE mg/dL
SPECIFIC GRAVITY, URINE: 1.015 (ref 1.005–1.030)
Squamous Epithelial / LPF: NONE SEEN
pH: 6 (ref 5.0–8.0)

## 2017-12-03 LAB — COMPREHENSIVE METABOLIC PANEL
ALBUMIN: 4.5 g/dL (ref 3.5–5.0)
ALT: 45 U/L (ref 14–54)
ANION GAP: 10 (ref 5–15)
AST: 42 U/L — AB (ref 15–41)
Alkaline Phosphatase: 202 U/L — ABNORMAL HIGH (ref 38–126)
BILIRUBIN TOTAL: 0.6 mg/dL (ref 0.3–1.2)
BUN: 13 mg/dL (ref 6–20)
CO2: 22 mmol/L (ref 22–32)
Calcium: 9.8 mg/dL (ref 8.9–10.3)
Chloride: 102 mmol/L (ref 101–111)
Creatinine, Ser: 0.65 mg/dL (ref 0.44–1.00)
GFR calc Af Amer: >60 mL/min (ref >60)
GFR calc non Af Amer: >60 mL/min (ref >60)
GLUCOSE: 103 mg/dL — AB (ref 65–99)
POTASSIUM: 3.8 mmol/L (ref 3.5–5.1)
SODIUM: 134 mmol/L — AB (ref 135–145)
Total Protein: 9.3 g/dL — ABNORMAL HIGH (ref 6.5–8.1)

## 2017-12-03 LAB — CBC
HEMATOCRIT: 41.3 % (ref 35.0–47.0)
HEMOGLOBIN: 14.2 g/dL (ref 12.0–16.0)
MCH: 34.6 pg — ABNORMAL HIGH (ref 26.0–34.0)
MCHC: 34.5 g/dL (ref 32.0–36.0)
MCV: 100.3 fL — AB (ref 80.0–100.0)
Platelets: 221 10*3/uL (ref 150–440)
RBC: 4.11 MIL/uL (ref 3.80–5.20)
RDW: 14.5 % (ref 11.5–14.5)
WBC: 4.1 10*3/uL (ref 3.6–11.0)

## 2017-12-03 LAB — HEPARIN LEVEL (UNFRACTIONATED): Heparin Unfractionated: 0.3 IU/mL (ref 0.30–0.70)

## 2017-12-03 LAB — APTT: APTT: 28 s (ref 24–36)

## 2017-12-03 LAB — LIPASE, BLOOD: Lipase: 34 U/L (ref 11–51)

## 2017-12-03 LAB — PROTIME-INR
INR: 1.05
PROTHROMBIN TIME: 13.6 s (ref 11.4–15.2)

## 2017-12-03 MED ORDER — ALBUTEROL SULFATE (2.5 MG/3ML) 0.083% IN NEBU
3.0000 mL | INHALATION_SOLUTION | RESPIRATORY_TRACT | Status: DC
  Administered 2017-12-03: 20:00:00 3 mL via RESPIRATORY_TRACT
  Filled 2017-12-03: qty 3

## 2017-12-03 MED ORDER — MORPHINE SULFATE (PF) 2 MG/ML IV SOLN: 2.0000 mg | INTRAVENOUS | Status: DC | PRN

## 2017-12-03 MED ORDER — ACETAMINOPHEN 325 MG PO TABS
650.0000 mg | ORAL_TABLET | Freq: Four times a day (QID) | ORAL | Status: DC | PRN
Start: 2017-12-03 — End: 2017-12-08

## 2017-12-03 MED ORDER — HEPARIN (PORCINE) IN NACL 100-0.45 UNIT/ML-% IJ SOLN: 10.0000 [IU]/kg/h | INTRAMUSCULAR | Status: DC

## 2017-12-03 MED ORDER — VITAMIN D (ERGOCALCIFEROL) 1.25 MG (50000 UNIT) PO CAPS
50000.0000 [IU] | ORAL_CAPSULE | ORAL | Status: DC
  Administered 2017-12-06: 09:00:00 50000 [IU] via ORAL
  Filled 2017-12-03: qty 1

## 2017-12-03 MED ORDER — ONDANSETRON HCL 4 MG PO TABS
4.0000 mg | ORAL_TABLET | Freq: Four times a day (QID) | ORAL | Status: DC | PRN
  Administered 2017-12-07: 13:00:00 4 mg via ORAL
  Filled 2017-12-03: qty 1

## 2017-12-03 MED ORDER — MORPHINE SULFATE (PF) 4 MG/ML IV SOLN
INTRAVENOUS | Status: AC
  Administered 2017-12-03: 4 mg via INTRAVENOUS
  Filled 2017-12-03: qty 1

## 2017-12-03 MED ORDER — MORPHINE SULFATE (PF) 4 MG/ML IV SOLN
INTRAVENOUS | Status: AC
  Filled 2017-12-03: qty 1

## 2017-12-03 MED ORDER — MORPHINE SULFATE (PF) 2 MG/ML IV SOLN
2.0000 mg | INTRAVENOUS | Status: DC | PRN
  Administered 2017-12-03 – 2017-12-04 (×2): 2 mg via INTRAVENOUS
  Filled 2017-12-03 (×2): qty 1

## 2017-12-03 MED ORDER — HEPARIN (PORCINE) IN NACL 100-0.45 UNIT/ML-% IJ SOLN
1250.0000 [IU]/h | INTRAMUSCULAR | Status: DC
  Administered 2017-12-03: 18:00:00 1150 [IU]/h via INTRAVENOUS
  Administered 2017-12-04 – 2017-12-08 (×5): 1250 [IU]/h via INTRAVENOUS
  Filled 2017-12-03 (×8): qty 250

## 2017-12-03 MED ORDER — IOPAMIDOL (ISOVUE-300) INJECTION 61%
100.0000 mL | Freq: Once | INTRAVENOUS | Status: AC | PRN
  Administered 2017-12-03: 100 mL via INTRAVENOUS
  Filled 2017-12-03: qty 100

## 2017-12-03 MED ORDER — ONDANSETRON HCL 4 MG/2ML IJ SOLN
4.0000 mg | Freq: Four times a day (QID) | INTRAMUSCULAR | Status: DC | PRN
  Administered 2017-12-04 – 2017-12-07 (×4): 4 mg via INTRAVENOUS
  Filled 2017-12-03 (×4): qty 2

## 2017-12-03 MED ORDER — LORAZEPAM 2 MG/ML IJ SOLN
1.0000 mg | Freq: Once | INTRAMUSCULAR | Status: AC | PRN
  Administered 2017-12-03: 1 mg via INTRAVENOUS
  Filled 2017-12-03: qty 1

## 2017-12-03 MED ORDER — GADOBENATE DIMEGLUMINE 529 MG/ML IV SOLN
15.0000 mL | Freq: Once | INTRAVENOUS | Status: AC | PRN
  Administered 2017-12-03: 22:00:00 15 mL via INTRAVENOUS

## 2017-12-03 MED ORDER — DIPHENOXYLATE-ATROPINE 2.5-0.025 MG PO TABS: 2.0000 | ORAL_TABLET | Freq: Four times a day (QID) | ORAL | Status: DC | PRN

## 2017-12-03 MED ORDER — HYDROCORTISONE 2.5 % RE CREA
1.0000 "application " | TOPICAL_CREAM | Freq: Two times a day (BID) | RECTAL | Status: DC
  Administered 2017-12-03 – 2017-12-08 (×9): 1 via RECTAL
  Filled 2017-12-03 (×2): qty 28.35

## 2017-12-03 MED ORDER — SODIUM CHLORIDE 0.9 % IV SOLN
INTRAVENOUS | Status: DC
  Administered 2017-12-03 – 2017-12-08 (×9): via INTRAVENOUS

## 2017-12-03 MED ORDER — MORPHINE SULFATE (PF) 4 MG/ML IV SOLN
4.0000 mg | Freq: Once | INTRAVENOUS | Status: AC
  Administered 2017-12-03: 4 mg via INTRAVENOUS

## 2017-12-03 MED ORDER — CHOLESTYRAMINE 4 G PO PACK
4.0000 g | PACK | Freq: Three times a day (TID) | ORAL | Status: DC
  Administered 2017-12-03 – 2017-12-08 (×14): 4 g via ORAL
  Filled 2017-12-03 (×16): qty 1

## 2017-12-03 MED ORDER — HEPARIN BOLUS VIA INFUSION
4000.0000 [IU] | Freq: Once | INTRAVENOUS | Status: AC
  Administered 2017-12-03: 18:00:00 4000 [IU] via INTRAVENOUS
  Filled 2017-12-03: qty 4000

## 2017-12-03 MED ORDER — MORPHINE SULFATE (PF) 4 MG/ML IV SOLN
4.0000 mg | Freq: Once | INTRAVENOUS | Status: AC
Start: 2017-12-03 — End: 2017-12-03
  Administered 2017-12-03: 4 mg via INTRAVENOUS
  Filled 2017-12-03: qty 1

## 2017-12-03 MED ORDER — SODIUM CHLORIDE 0.9 % IV SOLN
1000.0000 mL | Freq: Once | INTRAVENOUS | Status: AC
  Administered 2017-12-03: 1000 mL via INTRAVENOUS

## 2017-12-03 MED ORDER — DICYCLOMINE HCL 10 MG PO CAPS
10.0000 mg | ORAL_CAPSULE | Freq: Three times a day (TID) | ORAL | Status: DC
  Administered 2017-12-03 – 2017-12-08 (×19): 10 mg via ORAL
  Filled 2017-12-03 (×22): qty 1

## 2017-12-03 MED ORDER — HYDROCODONE-ACETAMINOPHEN 5-325 MG PO TABS
1.0000 | ORAL_TABLET | ORAL | Status: DC | PRN
  Administered 2017-12-03: 1 via ORAL
  Administered 2017-12-04 – 2017-12-08 (×18): 2 via ORAL
  Filled 2017-12-03 (×15): qty 2
  Filled 2017-12-03: qty 1
  Filled 2017-12-03 (×3): qty 2

## 2017-12-03 MED ORDER — SERTRALINE HCL 50 MG PO TABS
50.0000 mg | ORAL_TABLET | Freq: Every day | ORAL | Status: DC
  Administered 2017-12-03 – 2017-12-07 (×5): 50 mg via ORAL
  Filled 2017-12-03 (×6): qty 1

## 2017-12-03 MED ORDER — ONDANSETRON HCL 4 MG/2ML IJ SOLN
4.0000 mg | Freq: Once | INTRAMUSCULAR | Status: AC
  Administered 2017-12-03: 4 mg via INTRAVENOUS
  Filled 2017-12-03: qty 2

## 2017-12-03 MED ORDER — IOPAMIDOL (ISOVUE-300) INJECTION 61%
30.0000 mL | Freq: Once | INTRAVENOUS | Status: AC | PRN
  Administered 2017-12-03: 30 mL via ORAL
  Filled 2017-12-03: qty 30

## 2017-12-03 MED ORDER — ALBUTEROL SULFATE (2.5 MG/3ML) 0.083% IN NEBU: 3.0000 mL | INHALATION_SOLUTION | RESPIRATORY_TRACT | Status: DC | PRN

## 2017-12-03 MED ORDER — ACETAMINOPHEN 650 MG RE SUPP: 650.0000 mg | Freq: Four times a day (QID) | RECTAL | Status: DC | PRN

## 2017-12-03 MED ORDER — LOPERAMIDE HCL 2 MG PO CAPS: 2.0000 mg | ORAL_CAPSULE | Freq: Four times a day (QID) | ORAL | Status: DC | PRN

## 2017-12-03 NOTE — H&P (Signed)
[place] [facility] [place] NAME: Joanna Hall    MR#:  [id number]  DATE OF BIRTH:  1966-02-06  DATE OF ADMISSION:  12/03/2017  PRIMARY CARE PHYSICIAN: Valerie Roys, DO   REQUESTING/REFERRING PHYSICIAN: Dr. Corky Downs  CHIEF COMPLAINT: Abdominal pain   Chief Complaint  Patient presents with  . Abdominal Pain  . Emesis    HISTORY OF PRESENT ILLNESS:  Joanna Hall  is a 52 y.o. female with a known history of hep C, hypertension, history of cervical cancer status post chemo, radiation therapy is followed by [facility] oncology closely had a small bowel obstruction last year underwent ex lap with hemicolectomy by Dr. Hampton Abbot after that patient had factor V Leiden deficiency and started on Xarelto.  Patient seen by Dr. longer because of continuous chronic diarrhea.  But she came today because of severe left lower quadrant abdominal pain that is radiating to the back associated with nausea.  She states pain 10 out of 10 in severity.  Abdominal pain mainly in the left lower quadrant.  CT of abdomen showed worsening uterine infarct.  Patient does have a history of uterine infarct, factor V Leiden deficiency for which she takes Xarelto.  But today CT abdomen showed worsening uterine infarct.  She received multiple doses of morphine.  Rest of the lab work essentially normal but she is waiting for MRI of the abdomen.  No shortness of breath or cough or fever.  Does have chronic diarrhea, started on cholestyramine recently by Dr. Marius Ditch from gastroenterology  PAST MEDICAL HISTORY:   Past Medical History:  Diagnosis Date  . Anxiety   . Arthritis   . 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 Cancer ([place])    cervix  . Hepatitis C   . Hypertension   . 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 CHOLECYSTECTOMY    . LAPAROTOMY N/A 08/31/2017   Procedure:  EXPLORATORY LAPAROTOMY for SBO, ileocolectomy, removal of piece of uterine wall;  Surgeon: [name], MD;  Location: [facility] ORS;  Service: General;  Laterality: N/A;  . OOPHORECTOMY      SOCIAL HISTORY:   Social History   Tobacco Use  . Smoking status: Former Smoker    Last attempt to quit: 10/16/2006    Years since quitting: 11.1  . Smokeless tobacco: Never Used  Substance Use Topics  . Alcohol use: No    Frequency: Never    FAMILY HISTORY:  No family history on file.  DRUG ALLERGIES:   Allergies  Allergen Reactions  . Ketamine Other (See Comments) and Anxiety    Unknown but given during previous cancer treatment and passed out after getting med  Other reaction(s): Confusion    REVIEW OF SYSTEMS:  CONSTITUTIONAL: No fever, fatigue or weakness.  EYES: No blurred or double vision.  EARS, NOSE, AND THROAT: No tinnitus or ear pain.  RESPIRATORY: No cough, shortness of breath, wheezing or hemoptysis.  CARDIOVASCULAR: No chest pain, orthopnea, edema.  GASTROINTESTINAL: Nausea, vomiting, severe left lower quadrant abdominal pain since yesterday.   GENITOURINARY: No dysuria, hematuria.  ENDOCRINE: No polyuria, nocturia,  HEMATOLOGY: No anemia, easy bruising or bleeding SKIN: No rash or lesion. MUSCULOSKELETAL: No joint pain or arthritis.   NEUROLOGIC: No tingling, numbness, weakness.  PSYCHIATRY: No anxiety or depression.   MEDICATIONS AT HOME:   Prior to Admission  medications   Medication Sig Start Date End Date Taking? Authorizing Provider  acetaminophen (TYLENOL) 325 MG tablet Take 650 mg every 6 (six) hours as needed by mouth.   Yes [provider]  cholestyramine (QUESTRAN) 4 g packet Take 1 packet (4 g total) by mouth 3 (three) times daily with meals. 11/27/17 12/27/17 Yes Vanga, Tally Due, MD  cyanocobalamin (,VITAMIN B-12,) 1000 MCG/ML injection Inject 1ML every week x 4 weeks, then 1ML every other week x 2 months, then monthly x 3 months 11/29/17  Yes  Vanga, Tally Due, MD  dicyclomine (BENTYL) 10 MG capsule Take 1 capsule (10 mg total) by mouth 4 (four) times daily -  before meals and at bedtime. 11/27/17 12/27/17 Yes Vanga, Tally Due, MD  diphenoxylate-atropine (LOMOTIL) 2.5-0.025 MG tablet Take 2 tablets by mouth 4 (four) times daily as needed for diarrhea or loose stools. Titrate down after 48 hours 11/14/17  Yes Piscoya, Jose, MD  hydrocortisone (ANUSOL-HC) 2.5 % rectal cream Place 1 application rectally 2 (two) times daily. 11/15/17  Yes Piscoya, Jacqulyn Bath, MD  loperamide (IMODIUM A-D) 2 MG tablet Take 2 mg by mouth 4 (four) times daily as needed for diarrhea or loose stools.   Yes [provider]  Multiple Vitamin (MULTIVITAMIN WITH MINERALS) TABS tablet Take 1 tablet daily by mouth.   Yes [provider]  rivaroxaban (XARELTO) 20 MG TABS tablet Take 1 tablet (20 mg total) by mouth daily with supper. 10/05/17  Yes Vickie Epley, MD  sertraline (ZOLOFT) 50 MG tablet Take 50 mg daily by mouth.   Yes [provider]  Syringe/Needle, Disp, (SYRINGE 3CC/22GX1") 22G X 1" 3 ML MISC For use with Vitamin B12 injections 11/29/17  Yes Vanga, Tally Due, MD  VENTOLIN HFA 108 (90 Base) MCG/ACT inhaler INHALE 1-2 PUFFS BY MOUTH EVERY 4 HOURS AS NEEDED FOR WHEEZING / SHORTNESS OF BREATH 08/02/17  Yes [provider]  Vitamin D, Ergocalciferol, (DRISDOL) 50000 units CAPS capsule Take 1 capsule (50,000 Units total) by mouth every 7 (seven) days for 12 doses. 11/29/17 02/15/18 Yes Vanga, Tally Due, MD  Rivaroxaban (XARELTO) 15 MG TABS tablet Take 1 tablet (15 mg total) by mouth every 12 (twelve) hours. Patient not taking: Reported on 12/03/2017 11/01/17   Caroleen Hamman F, MD      VITAL SIGNS:  Blood pressure 134/89, pulse 80, temperature 98.1 F (36.7 C), temperature source Oral, resp. rate 16, weight 71.7 kg (158 lb), SpO2 100 %.  PHYSICAL EXAMINATION:  GENERAL:  52 y.o.-year-old patient lying in the bed with no acute  distress.  EYES: Pupils equal, round, reactive to light and accommodation. No scleral icterus. Extraocular muscles intact.  HEENT: Head atraumatic, normocephalic. Oropharynx and nasopharynx clear.  NECK:  Supple, no jugular venous distention. No thyroid enlargement, no tenderness.  LUNGS: Normal breath sounds bilaterally, no wheezing, rales,rhonchi or crepitation. No use of accessory muscles of respiration.  CARDIOVASCULAR: S1, S2 normal. No murmurs, rubs, or gallops.  ABDOMEN:  tenderness to palpation left lower quadrant.  EXTREMITIES: No pedal edema, cyanosis, or clubbing.  NEUROLOGIC: Cranial nerves II through XII are intact. Muscle strength 5/5 in all extremities. Sensation intact. Gait not checked.  PSYCHIATRIC: The patient is alert and oriented x 3.  SKIN: No obvious rash, lesion, or ulcer.   LABORATORY PANEL:   CBC Recent Labs  Lab 12/03/17 0941  WBC 4.1  HGB 14.2  HCT 41.3  PLT 221   ------------------------------------------------------------------------------------------------------------------  Chemistries  Recent Labs  Lab 12/03/17 0941  NA 134*  K 3.8  CL 102  CO2 22  GLUCOSE 103*  BUN 13  CREATININE 0.65  CALCIUM 9.8  AST 42*  ALT 45  ALKPHOS 202*  BILITOT 0.6   ------------------------------------------------------------------------------------------------------------------  Cardiac Enzymes No results for input(s): TROPONINI in the last 168 hours. ------------------------------------------------------------------------------------------------------------------  RADIOLOGY:  Ct Abdomen Pelvis W Contrast  Result Date: 12/03/2017 CLINICAL DATA:  Acute generalized abdominal pain. History of cervical cancer. EXAM: CT ABDOMEN AND PELVIS WITH CONTRAST TECHNIQUE: Multidetector CT imaging of the abdomen and pelvis was performed using the standard protocol following bolus administration of intravenous contrast. CONTRAST:  132m ISOVUE-300 IOPAMIDOL (ISOVUE-300)  INJECTION 61% COMPARISON:  CT scan of November 01, 2017. FINDINGS: Lower chest: Continued presence of multiple small nodules in both lung bases concerning for metastatic disease. Hepatobiliary: No focal liver abnormality is seen. Status post cholecystectomy. No biliary dilatation. Pancreas: Unremarkable. No pancreatic ductal dilatation or surrounding inflammatory changes. Spleen: Normal in size without focal abnormality. Adrenals/Urinary Tract: Adrenal glands are unremarkable. Kidneys are normal, without renal calculi, focal lesion, or hydronephrosis. Bladder is unremarkable. Stomach/Bowel: The stomach appears normal. Patient is status post right hemicolectomy. No bowel dilatation is noted. Mild wall thickening of rectosigmoid colon is noted which may represent lack of distension, but inflammation cannot be excluded. Vascular/Lymphatic: Aortic atherosclerosis. Stable subcentimeter periaortic lymph nodes are noted. Reproductive: No definite adnexal abnormality is noted. Small focus of gas is seen in the region of the cervix. There appears to be new low density involving the uterine fundus which may represent progressive infarction. Other: 1.8 x 1 6 cm complex fat and soft tissue density is again noted in the right lower quadrant which is stable. Musculoskeletal: No acute or significant osseous findings. IMPRESSION: Continued presence of multiple pulmonary nodules in visualized lung bases concerning for metastatic disease. Status post right hemicolectomy. No abnormal bowel dilatation is noted. Mild wall thickening of rectosigmoid colon is noted which may represent lack of distension, but inflammation cannot be excluded. Stable 1.8 x 1.6 cm mixed fat and soft tissue density is noted in right lower quadrant which may represent fat necrosis which may be postoperative in etiology. Larger low density is noted involving uterine fundus which may represent worsening or progressive uterine infarction. Electronically Signed    By: JMarijo Conception M.D.   On: 12/03/2017 11:57    EKG:   Orders placed or performed during the hospital encounter of 08/26/17  . ED EKG  . ED EKG    IMPRESSION AND PLAN:  52year old female patient with multiple medical problems of chronic hep C, history of cervical cancer with chemo, radiation therapy, history of small bowel obstruction status post ex lap with hemicolectomy, factor V Leiden deficiency, chronic diarrhea comes in today with severe abdominal pain and emesis. 1.  Severe left lower quadrant abdominal pain likely secondary to worsening uterine infarct, patient received multiple doses of IV morphine with temporary relief: Admit her to oncology unit for pain control, patient already on Xarelto but her uterine infarct is worsening, will start with heparin, may have to go home with Lovenox at this time to try a different anticoagulation.  Consult oncology regarding factor V Leiden deficiency, uterine infarcts, use of anticoagulation.  IV fluids, IV nausea medicines, full liquid diet for today and advance to regular diet tomorrow. 2.  Pulmonary nodules: Followed by [facility] closely. 3.  Chronic diarrhea: Started on cholestyramine 4 possible bile salt diarrhea.     All the records are reviewed and case discussed with  ED provider. Management plans discussed with the patient, family and they are in agreement.  CODE STATUS: full  TOTAL TIME TAKING CARE OF THIS PATIENT: 63mnutes.    SEpifanio LeschesM.D on 12/03/2017 at 4:20 PM  Between 7am to 6pm - Pager - [phone]  After 6pm go to [email or web address] - password [email or web address]  Office  [phone] CC: Primary care physician; JValerie Roys DO  Note: This dictation was prepared with Dragon dictation along with smaller phrase technology. Any transcriptional errors that result from this process are unintentional.

## 2017-12-03 NOTE — ED Triage Notes (Signed)
Pt with abd pain and vomiting, diarrhea started today. Pt had part of her colon removed recently and has had constant diarrhea since the surgery.

## 2017-12-03 NOTE — Progress Notes (Addendum)
ANTICOAGULATION CONSULT NOTE - Initial Consult  Pharmacy Consult for heparin Indication: uterine infarct/factor V Leiden deficiency  Allergies  Allergen Reactions  . Ketamine Other (See Comments) and Anxiety    Unknown but given during previous cancer treatment and passed out after getting med  Other reaction(s): Confusion    Patient Measurements: Weight: 158 lb (71.7 kg) Heparin Dosing Weight: 71.7 kg (ABW)  Vital Signs: Temp: 98.1 F (36.7 C) (02/18 0937) Temp Source: Oral (02/18 0937) BP: 134/89 (02/18 1527) Pulse Rate: 80 (02/18 1527)  Labs: Recent Labs    12/03/17 0941  HGB 14.2  HCT 41.3  PLT 221  CREATININE 0.65    Estimated Creatinine Clearance: 87.9 mL/min (by C-G formula based on SCr of 0.65 mg/dL).   Medical History: Past Medical History:  Diagnosis Date  . Anxiety   . Arthritis   . 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 Cancer ([place])    cervix  . Hepatitis C   . Hypertension   . 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 sodium chloride    . heparin      Assessment: 51 yof cc abdominal pain and emesis with PMH HCV, HTN, cervical cancer sp chemo/radiation followed by [facility] oncology. Had SBO last year sp ex lap with hemicolectomy by Dr. Hampton Abbot - noted factor V Leiden deficiency and started Xarelto. This visit 10/10 pain CT shows uterine infarct. Pharmacy consulted to dose heparin for Factor V Leiden deficiency and uterine infarct. Note, patient does take Xarelto at home and last dose was yesterday.  Goal of Therapy:  Heparin level 0.3-0.7 units/ml aPTT 68 to 109 seconds Monitor platelets by anticoagulation protocol: Yes   Plan:  Give 4000 units bolus x 1 Start heparin infusion at 1150 units/hr Check anti-Xa level in 6 hours and daily while on heparin Continue to monitor H&H and platelets  02/18 17:38 baseline HL 0.3, aPTT 28. HL is therapeutic and aPTT is subtherapeutic. Will use aPTT for dose  adjustments until levels correlate, but will draw aPTT and HL with next level in 6 hours.  Laural Benes, Pharm.D., BCPS Clinical Pharmacist 12/03/2017,4:33 PM   2/19 0000 aPTT 59, heparin level 0.26. Increase to 1250 units/hr and recheck aPTT in 6 hours.  Sim Boast, PharmD, BCPS  12/04/17 3:16 AM

## 2017-12-03 NOTE — ED Provider Notes (Signed)
[facility] Emergency Department Provider Note   ____________________________________________    I have reviewed the triage vital signs and the nursing notes.   HISTORY  Chief Complaint Abdominal Pain and Emesis     HPI Joanna Hall is a 52 y.o. female with a complex past medical history who presents with lower abdominal pain.  Patient has a history of cervical cancer treated "conservatively "primarily in [place], patient has moved to [place] and lives here now.  She is managed at the cancer center.  Patient reports yesterday she developed lower abdominal pain relatively abruptly primarily in the left lower quadrant and suprapubic region.  She reports the pain is sharp and cramping in nature and severe.  She did have a hemicolectomy in the past with complicated postoperative course, including uterine infarcts, discovery of factor V Leiden deficiency and is now on Xarelto which she takes daily.   Past Medical History:  Diagnosis Date  . Anxiety   . Arthritis   . 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 Cancer ([place])    cervix  . Hepatitis C   . Hypertension   . 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 History of Clostridium difficile infection 10/23/2017  . Factor V Leiden ([place]) 10/19/2017  . Goals of care, counseling/discussion 10/19/2017  . Abnormal cervical Papanicolaou smear 09/18/2017  . Cervical cancer, FIGO stage IB1 ([place]) 09/18/2017  . Chronic hepatitis C without hepatic coma ([place]) 09/18/2017  . Cytopenia 09/18/2017  . Diarrhea 09/18/2017  . Elevated LFTs 09/18/2017  . Erosive gastropathy 09/18/2017  . Hyponatremia 09/18/2017  . Intestinal infection due to Clostridium difficile 09/18/2017  . Lower abdominal pain 09/18/2017  . 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 Malignant neoplasm of overlapping sites of cervix ([place]) 09/18/2017  . Pelvic inflammatory  disease (PID) 09/18/2017  . Renal insufficiency 09/18/2017  . Wound infection after surgery 09/12/2017  . Hypokalemia   . Hypomagnesemia   . Ileocolic anastomotic leak   . Small bowel obstruction ([place]) 08/27/2017  . Cervical arthritis 07/18/2017  . Dysuria 06/20/2017  . Metastatic cancer ([place]) 05/12/2017  . Hepatitis, chronic ([place]) 05/05/2017  . Essential hypertension 03/15/2017  . Anemia in other chronic diseases classified elsewhere 03/01/2017  . Chemotherapy-induced neutropenia ([place]) 01/29/2017  . 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[place]) 09/25/2016    Past Surgical History:  Procedure Laterality Date  . CHOLECYSTECTOMY    . LAPAROTOMY N/A 08/31/2017   Procedure: EXPLORATORY LAPAROTOMY for SBO, ileocolectomy, removal of piece of uterine wall;  Surgeon: [name], MD;  Location: [facility] ORS;  Service: General;  Laterality: N/A;  . OOPHORECTOMY      Prior to Admission medications   Medication Sig Start Date End Date Taking? Authorizing Provider  acetaminophen (TYLENOL) 325 MG tablet Take 650 mg every 6 (six) hours as needed by mouth.    [provider]  cholestyramine (QUESTRAN) 4 g packet Take 1 packet (4 g total) by mouth 3 (three) times daily with meals. 11/27/17 12/27/17  [name], MD  cyanocobalamin (,VITAMIN B-12,) 1000 MCG/ML injection Inject 1ML every week x 4 weeks, then 1ML every other week x 2 months, then monthly x 3 months 11/29/17   [name], MD  dicyclomine (BENTYL) 10 MG capsule Take 1 capsule (10 mg total) by mouth 4 (four) times daily -  before meals and at bedtime. 11/27/17 12/27/17  [name], MD  diphenoxylate-atropine (LOMOTIL) 2.5-0.025 MG tablet Take 2 tablets by mouth 4 (four) times daily as needed for diarrhea or loose stools. Titrate down after 48 hours 11/14/17   Piscoya, Jacqulyn Bath, MD  hydrocortisone (ANUSOL-HC) 2.5 % rectal cream Place 1 application rectally 2 (two) times daily. 11/15/17   [name], MD  hydrocortisone  (ANUSOL-HC) 25 MG suppository Place 1 suppository (25 mg total) rectally 2 (two) times daily. 11/14/17   [name], MD  loperamide (IMODIUM A-D) 2 MG tablet Take 2 mg by mouth 4 (four) times daily as needed for diarrhea or loose stools.    [provider]  Multiple Vitamin (MULTIVITAMIN WITH MINERALS) TABS tablet Take 1 tablet daily by mouth.    [provider]  Rivaroxaban (XARELTO) 15 MG TABS tablet Take 1 tablet (15 mg total) by mouth every 12 (twelve) hours. 11/01/17   Pabon, Marjory Lies, MD  rivaroxaban (XARELTO) 20 MG TABS tablet Take 1 tablet (20 mg total) by mouth daily with supper. 10/05/17   Vickie Epley, MD  sertraline (ZOLOFT) 50 MG tablet Take 50 mg daily by mouth.    [provider]  Syringe/Needle, Disp, (SYRINGE 3CC/22GX1") 22G X 1" 3 ML MISC For use with Vitamin B12 injections 11/29/17   Vanga, Tally Due, MD  VENTOLIN HFA 108 (90 Base) MCG/ACT inhaler INHALE 1-2 PUFFS BY MOUTH EVERY 4 HOURS AS NEEDED FOR WHEEZING / SHORTNESS OF BREATH 08/02/17   [provider]  Vitamin D, Ergocalciferol, (DRISDOL) 50000 units CAPS capsule Take 1 capsule (50,000 Units total) by mouth every 7 (seven) days for 12 doses. 11/29/17 02/15/18  [name], MD     Allergies Ketamine  No family history on file.  Social History Social History   Tobacco Use  . Smoking status: Former Smoker    Last attempt to quit: 10/16/2006    Years since quitting: 11.1  . Smokeless tobacco: Never Used  Substance Use Topics  . Alcohol use: No    Frequency: Never  . Drug use: No    Review of Systems  Constitutional: No fever/chills Eyes: No visual changes.  ENT: No sore throat. Cardiovascular: Denies chest pain. Respiratory: Denies shortness of breath. Gastrointestinal: Positive abdominal pain, positive nausea and vomiting, chronic diarrhea Genitourinary: Negative for dysuria. Musculoskeletal: Negative for back pain. Skin: Negative for rash. Neurological:  Negative for headaches   ____________________________________________   PHYSICAL EXAM:  VITAL SIGNS: ED Triage Vitals  Enc Vitals Group     BP 12/03/17 0937 (!) 163/103     Pulse Rate 12/03/17 0937 99     Resp 12/03/17 0937 20     Temp 12/03/17 0937 98.1 F (36.7 C)     Temp Source 12/03/17 0937 Oral     SpO2 12/03/17 0937 100 %     Weight 12/03/17 0938 71.7 kg (158 lb)     Height --      Head Circumference --      Peak Flow --      Pain Score 12/03/17 0938 10     Pain Loc --      Pain Edu? --      Excl. in [place]? --     Constitutional: Alert and oriented.  Uncomfortable Eyes: Conjunctivae are normal.   Nose: No congestion/rhinnorhea. Mouth/Throat: Mucous membranes are moist.    Cardiovascular: Normal rate, regular rhythm. Grossly normal heart sounds.  Good peripheral circulation. Respiratory: Normal respiratory effort.  No retractions. Lungs CTAB. Gastrointestinal: There is palpation  left lower quadrant and suprapubicly no distention.  No CVA tenderness. Genitourinary: deferred Musculoskeletal: Warm and well perfused Neurologic:  Normal speech and language. No gross focal neurologic deficits are appreciated.  Skin:  Skin is warm, dry and intact. No rash noted. Psychiatric: Mood and affect are normal. Speech and behavior are normal.  ____________________________________________   LABS (all labs ordered are listed, but only abnormal results are displayed)  Labs Reviewed  COMPREHENSIVE METABOLIC PANEL - Abnormal; Notable for the following components:      Result Value   Sodium 134 (*)    Glucose, Bld 103 (*)    Total Protein 9.3 (*)    AST 42 (*)    Alkaline Phosphatase 202 (*)    All other components within normal limits  CBC - Abnormal; Notable for the following components:   MCV 100.3 (*)    MCH 34.6 (*)    All other components within normal limits  URINALYSIS, COMPLETE (UACMP) WITH MICROSCOPIC - Abnormal; Notable for the following components:   Color,  Urine YELLOW (*)    APPearance CLOUDY (*)    Leukocytes, UA TRACE (*)    All other components within normal limits  LIPASE, BLOOD   ____________________________________________  EKG  None ____________________________________________  RADIOLOGY  CT abdomen pelvis with known pulmonary nodules, no small bowel obstruction, possibly worsening or progressive uterine infarction  Discussed with radiologist how better to image uterus to determine whether worsening infarction and he recommended MRI with and without of the pelvis ____________________________________________   PROCEDURES  Procedure(s) performed: No  Procedures    ____________________________________________   INITIAL IMPRESSION / ASSESSMENT AND PLAN / ED COURSE  Pertinent labs & imaging results that were available during my care of the patient were reviewed by me and considered in my medical decision making (see chart for details).  Patient presents with lower abdominal pain.  She is tender in the area with a complex history as detailed above.  Differential diagnosis is long including diverticular disease, small bowel obstruction, worsening uterine infarct, new mass.  With IV morphine and Zofran.  This helped significantly however she then required a second dose of IV morphine.  CT scan primarily concerning for possible worsening infarct, this would fit with her description of the pain being relatively abrupt however she is on Xarelto.  We will order the MRI but I feel the patient will require admission and so I talked with Dr. Vianne Bulls will follow up on imaging studies and admit the patient    ____________________________________________   FINAL CLINICAL IMPRESSION(S) / ED DIAGNOSES  Final diagnoses:  Lower abdominal pain        Note:  This document was prepared using Dragon voice recognition software and may include unintentional dictation errors.    Lavonia Drafts, MD 12/03/17 1340

## 2017-12-03 NOTE — ED Notes (Signed)
Patient transported to CT 

## 2017-12-03 NOTE — ED Notes (Signed)
Admitting MD at bedside.

## 2017-12-04 DIAGNOSIS — N280 Ischemia and infarction of kidney: Secondary | ICD-10-CM

## 2017-12-04 DIAGNOSIS — D419 Neoplasm of uncertain behavior of unspecified urinary organ: Secondary | ICD-10-CM

## 2017-12-04 DIAGNOSIS — C530 Malignant neoplasm of endocervix: Secondary | ICD-10-CM

## 2017-12-04 DIAGNOSIS — Z87891 Personal history of nicotine dependence: Secondary | ICD-10-CM

## 2017-12-04 DIAGNOSIS — K56609 Unspecified intestinal obstruction, unspecified as to partial versus complete obstruction: Secondary | ICD-10-CM

## 2017-12-04 DIAGNOSIS — Z923 Personal history of irradiation: Secondary | ICD-10-CM

## 2017-12-04 DIAGNOSIS — N858 Other specified noninflammatory disorders of uterus: Secondary | ICD-10-CM

## 2017-12-04 DIAGNOSIS — B1920 Unspecified viral hepatitis C without hepatic coma: Secondary | ICD-10-CM

## 2017-12-04 DIAGNOSIS — R918 Other nonspecific abnormal finding of lung field: Secondary | ICD-10-CM

## 2017-12-04 DIAGNOSIS — I10 Essential (primary) hypertension: Secondary | ICD-10-CM

## 2017-12-04 DIAGNOSIS — Z7901 Long term (current) use of anticoagulants: Secondary | ICD-10-CM

## 2017-12-04 DIAGNOSIS — Z79899 Other long term (current) drug therapy: Secondary | ICD-10-CM

## 2017-12-04 DIAGNOSIS — E440 Moderate protein-calorie malnutrition: Secondary | ICD-10-CM

## 2017-12-04 DIAGNOSIS — D6851 Activated protein C resistance: Secondary | ICD-10-CM

## 2017-12-04 DIAGNOSIS — Z9221 Personal history of antineoplastic chemotherapy: Secondary | ICD-10-CM

## 2017-12-04 LAB — CBC
HCT: 34.1 % — ABNORMAL LOW (ref 35.0–47.0)
HEMOGLOBIN: 11.7 g/dL — AB (ref 12.0–16.0)
MCH: 35.1 pg — AB (ref 26.0–34.0)
MCHC: 34.2 g/dL (ref 32.0–36.0)
MCV: 102.4 fL — AB (ref 80.0–100.0)
Platelets: 168 10*3/uL (ref 150–440)
RBC: 3.33 MIL/uL — AB (ref 3.80–5.20)
RDW: 15.2 % — ABNORMAL HIGH (ref 11.5–14.5)
WBC: 2.4 10*3/uL — ABNORMAL LOW (ref 3.6–11.0)

## 2017-12-04 LAB — BASIC METABOLIC PANEL
Anion gap: 7 (ref 5–15)
BUN: 6 mg/dL (ref 6–20)
CHLORIDE: 106 mmol/L (ref 101–111)
CO2: 22 mmol/L (ref 22–32)
CREATININE: 0.53 mg/dL (ref 0.44–1.00)
Calcium: 8.9 mg/dL (ref 8.9–10.3)
GFR calc non Af Amer: >60 mL/min (ref >60)
Glucose, Bld: 83 mg/dL (ref 65–99)
POTASSIUM: 3.9 mmol/L (ref 3.5–5.1)
Sodium: 135 mmol/L (ref 135–145)

## 2017-12-04 LAB — APTT
APTT: 73 s — AB (ref 24–36)
APTT: 84 s — AB (ref 24–36)
aPTT: 59 seconds — ABNORMAL HIGH (ref 24–36)

## 2017-12-04 LAB — HEPARIN LEVEL (UNFRACTIONATED): Heparin Unfractionated: 0.26 IU/mL — ABNORMAL LOW (ref 0.30–0.70)

## 2017-12-04 LAB — GLUCOSE, CAPILLARY: Glucose-Capillary: 99 mg/dL (ref 65–99)

## 2017-12-04 MED ORDER — ADULT MULTIVITAMIN W/MINERALS CH
1.0000 | ORAL_TABLET | Freq: Every day | ORAL | Status: DC
  Administered 2017-12-05 – 2017-12-08 (×4): 1 via ORAL
  Filled 2017-12-04 (×4): qty 1

## 2017-12-04 MED ORDER — ENSURE ENLIVE PO LIQD
237.0000 mL | Freq: Two times a day (BID) | ORAL | Status: DC
  Administered 2017-12-05 – 2017-12-08 (×6): 237 mL via ORAL

## 2017-12-04 NOTE — Consult Note (Addendum)
[facility]  Date of admission:  12/03/2017  Inpatient day:  12/04/2017  Consulting physician: Dr. Epifanio Lesches  Reason for Consultation:  Factor V Leiden deficiency having abdominal pain and having worsening uterine infarct  Chief Complaint: Joanna Hall is a 52 y.o. female with cervical cancer and left kidney and right uterine infarct who was admitted through the emergency room with abdominal pain.  HPI: The patient was last seen in the medical oncology clinic on 10/19/2017.  At that time, she described diarrhea sinceher  right ileocolectomy.  Midline incision was healing. She has gained weight.  We discussed plans for restaging PRT scan in [place].  PET scan on 11/08/2017 at the [facility] revealed innumerable stable bilateral cavitary pulmonary nodules (up to 8 mm), some of which demonstrate low level metabolic activity.  There were post treatment changes in the pelvis without evidence of suspicious hypermetabolic activity.  She saw Dr. Marius Ditch on 11/27/2017 secondary to ongoing diarrhea.  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  She started cholestyramine 4 g 2-3 times daily with meals as well as dicyclomine for abdominal cramps  She states that she started having LLQ pain on Sunday, 12/02/2017.   Pain extended across her lower abdomen then around her side and into her back.  She denied any change in bowel movements (chronic diarrhea).  She denied any melena or hematochezia.  She has had some dysuria for a few days and a sense of urgency.  She did not have a fever.  She has remained on Xarelto (no doses missed).  Abdomen and pelvic CT on 12/03/2017 revealed continued multiple pulmonary nodules in the lung bases concerning for metastatic disease.  She is s/p right hemicolectomy. No abnormal bowel dilatation is noted. Mild wall thickening of rectosigmoid colon is noted.  There was a stable 1.8 x 1.6 cm mixed fat and soft  tissue density noted in right lower quadrant which may represent fat necrosis.  There was a larger low density is noted involving uterine fundus which may represent worsening or progressive uterine infarction.  Pelvic MRI on 12/03/2017 was unremarkable.  There was remote scarring type changes involving the uterus and possible radiation changes. There was no acute abnormality  Pain was 10 out of 10 in ER.  She required several doses of morphine.  Pain is now a 5 out of 10.  She is on a Lovenox drip.   Past Medical History:  Diagnosis Date  . Anxiety   . Arthritis   . 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 Cancer ([place])    cervix  . Hepatitis C   . Hypertension   . 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 CHOLECYSTECTOMY    . LAPAROTOMY N/A 08/31/2017   Procedure: EXPLORATORY LAPAROTOMY for SBO, ileocolectomy, removal of piece of uterine wall;  Surgeon: [name], MD;  Location: [facility] ORS;  Service: General;  Laterality: N/A;  . OOPHORECTOMY      No family history on file.  Social History:  reports that she quit smoking about 11 years ago. she has never used smokeless tobacco. She reports that she does not drink alcohol or use drugs.  The patient is originally from [place]. She recently moved from [place] to [place].  She is alone today.  Allergies:  Allergies  Allergen Reactions  . Ketamine Other (See  Comments) and Anxiety    Unknown but given during previous cancer treatment and passed out after getting med  Other reaction(s): Confusion    Medications Prior to Admission  Medication Sig Dispense Refill  . acetaminophen (TYLENOL) 325 MG tablet Take 650 mg every 6 (six) hours as needed by mouth.    . cholestyramine (QUESTRAN) 4 g packet Take 1 packet (4 g total) by mouth 3 (three) times daily with meals. 90 packet 0  . cyanocobalamin (,VITAMIN B-12,) 1000 MCG/ML injection Inject 1ML  every week x 4 weeks, then 1ML every other week x 2 months, then monthly x 3 months 10 mL 1  . dicyclomine (BENTYL) 10 MG capsule Take 1 capsule (10 mg total) by mouth 4 (four) times daily -  before meals and at bedtime. 120 capsule 0  . diphenoxylate-atropine (LOMOTIL) 2.5-0.025 MG tablet Take 2 tablets by mouth 4 (four) times daily as needed for diarrhea or loose stools. Titrate down after 48 hours 50 tablet 0  . hydrocortisone (ANUSOL-HC) 2.5 % rectal cream Place 1 application rectally 2 (two) times daily. 30 g 0  . loperamide (IMODIUM A-D) 2 MG tablet Take 2 mg by mouth 4 (four) times daily as needed for diarrhea or loose stools.    . Multiple Vitamin (MULTIVITAMIN WITH MINERALS) TABS tablet Take 1 tablet daily by mouth.    . rivaroxaban (XARELTO) 20 MG TABS tablet Take 1 tablet (20 mg total) by mouth daily with supper. 30 tablet 0  . sertraline (ZOLOFT) 50 MG tablet Take 50 mg daily by mouth.    . Syringe/Needle, Disp, (SYRINGE 3CC/22GX1") 22G X 1" 3 ML MISC For use with Vitamin B12 injections 50 each 0  . VENTOLIN HFA 108 (90 Base) MCG/ACT inhaler INHALE 1-2 PUFFS BY MOUTH EVERY 4 HOURS AS NEEDED FOR WHEEZING / SHORTNESS OF BREATH  0  . Vitamin D, Ergocalciferol, (DRISDOL) 50000 units CAPS capsule Take 1 capsule (50,000 Units total) by mouth every 7 (seven) days for 12 doses. 12 capsule 0  . Rivaroxaban (XARELTO) 15 MG TABS tablet Take 1 tablet (15 mg total) by mouth every 12 (twelve) hours. (Patient not taking: Reported on 12/03/2017) 42 tablet 0    Review of Systems: GENERAL: Lower abdominal pain.  No fevers orsweats.  PERFORMANCE STATUS (ECOG):2 HEENT:No visual changes, runny nose, sore throat, mouth sores or tenderness. Lungs: No shortness of breath or cough. No hemoptysis. Cardiac:No chest pain, palpitations, orthopnea, or PND. IN:OMVEH bowel movements since surgery (see HPI). Nausea and vomiting in 02/17.  No constipation, melena or hematochezia. GU: Some dysuria.   Urgency.  No frequency or hematuria. h/o recurrent UTIs. Musculoskeletal:Osteoarthritis in shoulders and neck.No back pain. No muscle tenderness. Extremities: No pain or swelling. Skin: No rashes or skin changes. Neuro: No headache, numbness or weakness, balance or coordination issues. Endocrine: No diabetes, thyroid issues, hot flashes or night sweats. Psych: No mood changes, depression or anxiety. Pain: Lower abdominal pain (worse LLQ). Review of systems: All other systems reviewed and found to be negative.  Physical Exam:  Blood pressure 140/73, pulse 63, temperature 98.2 F (36.8 C), temperature source Oral, resp. rate 18, height 5' 9"  (1.753 m), weight 160 lb 7.9 oz (72.8 kg), SpO2 99 %.  GENERAL:Fatigued appearing womanlyingcomfortably on the medical unit in no acute distress. MENTAL STATUS: Alert and oriented to person, place and time. HEAD:Long brownhair. Normocephalic, atraumatic, face symmetric, no Cushingoid features. EYES:Blueeyes. Pupils equal round and reactive to light and accomodation. No conjunctivitis or scleral icterus. MCN:[id number]  clear without lesion. Tonguenormal.No upper teeth.Mucous membranes moist. RESPIRATORY:Clear to auscultationwithout rales, wheezes or rhonchi. CARDIOVASCULAR:Regular rate andrhythmwithout murmur, rub or gallop. ABDOMEN:Soft, lower abdominal tenderness.  Discomfort most pronouned in LLQ.  No guarding or rebound tenderness.  Active bowel sounds and no hepatosplenomegaly. No masses. SKIN:No rashes or ulcers. EXTREMITIES:No edema, no skin discoloration or tenderness. No palpable cords. LYMPHNODES: No palpable cervical, supraclavicular, axillary or inguinal adenopathy  NEUROLOGICAL: Unremarkable. PSYCH: Appropriate.   Results for orders placed or performed during the hospital encounter of 12/03/17 (from the past 48 hour(s))  Lipase, blood     Status: None   Collection Time: 12/03/17  9:41 AM   Result Value Ref Range   Lipase 34 11 - 51 U/L    Comment: Performed at [facility], [address] [place], [place] [address]  Comprehensive metabolic panel     Status: Abnormal   Collection Time: 12/03/17  9:41 AM  Result Value Ref Range   Sodium 134 (L) 135 - 145 mmol/L   Potassium 3.8 3.5 - 5.1 mmol/L   Chloride 102 101 - 111 mmol/L   CO2 22 22 - 32 mmol/L   Glucose, Bld 103 (H) 65 - 99 mg/dL   BUN 13 6 - 20 mg/dL   Creatinine, Ser 0.65 0.44 - 1.00 mg/dL   Calcium 9.8 8.9 - 10.3 mg/dL   Total Protein 9.3 (H) 6.5 - 8.1 g/dL   Albumin 4.5 3.5 - 5.0 g/dL   AST 42 (H) 15 - 41 U/L   ALT 45 14 - 54 U/L   Alkaline Phosphatase 202 (H) 38 - 126 U/L   Total Bilirubin 0.6 0.3 - 1.2 mg/dL   GFR calc non Af Amer >60 >60 mL/min   GFR calc Af Amer >60 >60 mL/min    Comment: (NOTE) The eGFR has been calculated using the CKD EPI equation. This calculation has not been validated in all clinical situations. eGFR's persistently <60 mL/min signify possible Chronic Kidney Disease.    Anion gap 10 5 - 15    Comment: Performed at [facility], [address] [place], [place] [address]  CBC     Status: Abnormal   Collection Time: 12/03/17  9:41 AM  Result Value Ref Range   WBC 4.1 3.6 - 11.0 K/uL   RBC 4.11 3.80 - 5.20 MIL/uL   Hemoglobin 14.2 12.0 - 16.0 g/dL   HCT 41.3 35.0 - 47.0 %   MCV 100.3 (H) 80.0 - 100.0 fL   MCH 34.6 (H) 26.0 - 34.0 pg   MCHC 34.5 32.0 - 36.0 g/dL   RDW 14.5 11.5 - 14.5 %   Platelets 221 150 - 440 K/uL    Comment: Performed at [facility], [address] [place], [place] [address]  Urinalysis, Complete w Microscopic     Status: Abnormal   Collection Time: 12/03/17  9:41 AM  Result Value Ref Range   Color, Urine YELLOW (A) YELLOW   APPearance CLOUDY (A) CLEAR   Specific Gravity, Urine 1.015 1.005 - 1.030   pH 6.0 5.0 - 8.0   Glucose, UA NEGATIVE NEGATIVE mg/dL   Hgb urine dipstick NEGATIVE NEGATIVE   Bilirubin Urine  NEGATIVE NEGATIVE   Ketones, ur NEGATIVE NEGATIVE mg/dL   Protein, ur NEGATIVE NEGATIVE mg/dL   Nitrite NEGATIVE NEGATIVE   Leukocytes, UA TRACE (A) NEGATIVE   RBC / HPF 0-5 0 - 5 RBC/hpf   WBC, UA 0-5 0 - 5 WBC/hpf   Bacteria, UA NONE SEEN  NONE SEEN   Squamous Epithelial / LPF NONE SEEN NONE SEEN   Hyaline Casts, UA PRESENT     Comment: Performed at [facility], [place], [place] [address]  Heparin level (unfractionated)     Status: None   Collection Time: 12/03/17  5:01 PM  Result Value Ref Range   Heparin Unfractionated 0.30 0.30 - 0.70 IU/mL    Comment:        IF HEPARIN RESULTS ARE BELOW EXPECTED VALUES, AND PATIENT DOSAGE HAS BEEN CONFIRMED, SUGGEST FOLLOW UP TESTING OF ANTITHROMBIN III LEVELS. Performed at [facility], [address] [place], [place] [address]   APTT     Status: None   Collection Time: 12/03/17  5:01 PM  Result Value Ref Range   aPTT 28 24 - 36 seconds    Comment: Performed at [facility], [address] [place], [place] [address]  Protime-INR     Status: None   Collection Time: 12/03/17  5:01 PM  Result Value Ref Range   Prothrombin Time 13.6 11.4 - 15.2 seconds   INR 1.05     Comment: Performed at [facility], [address] [place], [place] [address]  APTT     Status: Abnormal   Collection Time: 12/03/17 11:55 PM  Result Value Ref Range   aPTT 59 (H) 24 - 36 seconds    Comment:        IF BASELINE aPTT IS ELEVATED, SUGGEST PATIENT RISK ASSESSMENT BE USED TO DETERMINE APPROPRIATE ANTICOAGULANT THERAPY. Performed at [facility], [place], [place] [address]   Heparin level (unfractionated)     Status: Abnormal   Collection Time: 12/03/17 11:55 PM  Result Value Ref Range   Heparin Unfractionated 0.26 (L) 0.30 - 0.70 IU/mL    Comment:        IF HEPARIN RESULTS ARE BELOW EXPECTED VALUES, AND PATIENT DOSAGE HAS BEEN CONFIRMED, SUGGEST FOLLOW UP TESTING OF  ANTITHROMBIN III LEVELS. Performed at [facility], [address] [place], [place] [address]   Basic metabolic panel     Status: None   Collection Time: 12/04/17  4:55 AM  Result Value Ref Range   Sodium 135 135 - 145 mmol/L   Potassium 3.9 3.5 - 5.1 mmol/L   Chloride 106 101 - 111 mmol/L   CO2 22 22 - 32 mmol/L   Glucose, Bld 83 65 - 99 mg/dL   BUN 6 6 - 20 mg/dL   Creatinine, Ser 0.53 0.44 - 1.00 mg/dL   Calcium 8.9 8.9 - 10.3 mg/dL   GFR calc non Af Amer >60 >60 mL/min   GFR calc Af Amer >60 >60 mL/min    Comment: (NOTE) The eGFR has been calculated using the CKD EPI equation. This calculation has not been validated in all clinical situations. eGFR's persistently <60 mL/min signify possible Chronic Kidney Disease.    Anion gap 7 5 - 15    Comment: Performed at [facility], [address] [place], [place] [address]  CBC     Status: Abnormal   Collection Time: 12/04/17  4:55 AM  Result Value Ref Range   WBC 2.4 (L) 3.6 - 11.0 K/uL   RBC 3.33 (L) 3.80 - 5.20 MIL/uL   Hemoglobin 11.7 (L) 12.0 - 16.0 g/dL   HCT 34.1 (L) 35.0 - 47.0 %   MCV 102.4 (H) 80.0 - 100.0 fL   MCH 35.1 (H) 26.0 - 34.0 pg   MCHC  34.2 32.0 - 36.0 g/dL   RDW 15.2 (H) 11.5 - 14.5 %   Platelets 168 150 - 440 K/uL    Comment: Performed at [facility], [address] [place], [place] [address]  Glucose, capillary     Status: None   Collection Time: 12/04/17  8:40 AM  Result Value Ref Range   Glucose-Capillary 99 65 - 99 mg/dL  APTT     Status: Abnormal   Collection Time: 12/04/17  9:33 AM  Result Value Ref Range   aPTT 73 (H) 24 - 36 seconds    Comment:        IF BASELINE aPTT IS ELEVATED, SUGGEST PATIENT RISK ASSESSMENT BE USED TO DETERMINE APPROPRIATE ANTICOAGULANT THERAPY. Performed at [facility], [address] [place], [place] [address]   APTT     Status: Abnormal   Collection Time: 12/04/17  3:34 PM  Result Value Ref Range   aPTT 84 (H) 24 -  36 seconds    Comment:        IF BASELINE aPTT IS ELEVATED, SUGGEST PATIENT RISK ASSESSMENT BE USED TO DETERMINE APPROPRIATE ANTICOAGULANT THERAPY. Performed at [facility], [address] [place], [place] [address]    Mr Pelvis W Wo Contrast  Result Date: 12/03/2017 CLINICAL DATA:  Pelvic pain.  Remote history of cervical cancer. EXAM: MRI PELVIS WITHOUT AND WITH CONTRAST TECHNIQUE: Multiplanar multisequence MR imaging of the pelvis was performed both before and after administration of intravenous contrast. CONTRAST:  26m MULTIHANCE GADOBENATE DIMEGLUMINE 529 MG/ML IV SOLN COMPARISON:  Multiple prior CT scans. FINDINGS: Urinary Tract: The bladder is unremarkable. No bladder mass or asymmetric bladder wall thickening. Both kidneys appear normal. No hydronephrosis. Bowel:  The visualized small bowel and colon are grossly normal. Vascular/Lymphatic: The major vascular structures appear normal. No pelvic lymphadenopathy. No inguinal adenopathy. Reproductive: Remote scarring type changes involving the uterus. Diffuse low T2 signal intensity throughout the myometrium may be related to prior radiation. No acute abnormality or evidence of mass. The left ovary is identified and appears normal. I do not see the right ovary for certain. Other: No free pelvic fluid collections or pelvic mass. Mild presacral edema. Musculoskeletal: No significant bony findings. Advanced disc disease at L4-5 and L5-S1. IMPRESSION: 1. Unremarkable pelvic MRI. No acute findings, mass lesions or adenopathy. 2. Remote scarring type changes involving the uterus and possible radiation changes. No acute abnormality. No obvious findings for recurrent cervical cancer. Electronically Signed   By: PMarijo SanesM.D.   On: 12/03/2017 21:52   Ct Abdomen Pelvis W Contrast  Result Date: 12/03/2017 CLINICAL DATA:  Acute generalized abdominal pain. History of cervical cancer. EXAM: CT ABDOMEN AND PELVIS WITH CONTRAST TECHNIQUE:  Multidetector CT imaging of the abdomen and pelvis was performed using the standard protocol following bolus administration of intravenous contrast. CONTRAST:  1029mISOVUE-300 IOPAMIDOL (ISOVUE-300) INJECTION 61% COMPARISON:  CT scan of November 01, 2017. FINDINGS: Lower chest: Continued presence of multiple small nodules in both lung bases concerning for metastatic disease. Hepatobiliary: No focal liver abnormality is seen. Status post cholecystectomy. No biliary dilatation. Pancreas: Unremarkable. No pancreatic ductal dilatation or surrounding inflammatory changes. Spleen: Normal in size without focal abnormality. Adrenals/Urinary Tract: Adrenal glands are unremarkable. Kidneys are normal, without renal calculi, focal lesion, or hydronephrosis. Bladder is unremarkable. Stomach/Bowel: The stomach appears normal. Patient is status post right hemicolectomy. No bowel dilatation is noted. Mild wall thickening of rectosigmoid colon is noted which may represent lack of distension, but inflammation  cannot be excluded. Vascular/Lymphatic: Aortic atherosclerosis. Stable subcentimeter periaortic lymph nodes are noted. Reproductive: No definite adnexal abnormality is noted. Small focus of gas is seen in the region of the cervix. There appears to be new low density involving the uterine fundus which may represent progressive infarction. Other: 1.8 x 1 6 cm complex fat and soft tissue density is again noted in the right lower quadrant which is stable. Musculoskeletal: No acute or significant osseous findings. IMPRESSION: Continued presence of multiple pulmonary nodules in visualized lung bases concerning for metastatic disease. Status post right hemicolectomy. No abnormal bowel dilatation is noted. Mild wall thickening of rectosigmoid colon is noted which may represent lack of distension, but inflammation cannot be excluded. Stable 1.8 x 1.6 cm mixed fat and soft tissue density is noted in right lower quadrant which may  represent fat necrosis which may be postoperative in etiology. Larger low density is noted involving uterine fundus which may represent worsening or progressive uterine infarction. Electronically Signed   By: Marijo Conception, M.D.   On: 12/03/2017 11:57    Assessment:  The patient is a 52 y.o. woman with a history of stage IB1 cervical cancers/p concurrent cisplatin and radiation(external beam and brachytherapy) from 11/2016 - 02/2017. She was treated by Dr Christene Slates at [facility] in [place], [place]. Course was complicated by weight loss (80 pounds), nausea, vomiting, electrolyte wasting (potassium and magnesium), and multiple hospitalizations.  PET scanon 11/08/2017 at the [place] [facility] revealed innumerable stable bilateral cavitary pulmonary nodules (up to 8 mm), some of which demonstrate low level metabolic activity.  There were post treatment changes in the pelvis without evidence of suspicious hypermetabolic activity.  She is s/p right ileocolectomyon 08/31/2017 for a small bowel obstruction. She presented with abdominal pain. 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  She was discharged on Xarelto.  She presented with new lower abdominal pain most pronounced in the LLQ.  Abdomen and pelvic CT suggested possible worsening or progressive uterine infarction.  There were no other acute changes.  Pelvic MRI revealed evolving radiation changes.  Plan:   1.  Oncology:  Patient with cervical cancer.  Recent PET scan shows stable lung nodules.  Abdominal imaging reveals no evidence of recurrent disease.  2.  Hematology:  Images reviewed with radiology do not suggest worsening uterine infarct on Xarelto.  Etiology of lower abdominal pain  unclear.  Suggest GYN evaluation.  Continue heparin for now.  If concern still present for uterine infarct, would need to switch patient to different anticoagulant (Lovenox).  3.  Nephrology:   Follow-up urine culture given symptoms of dysuria and urgency.  4.  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  She was recently started on cholestyramine. Scans reveal no diverticulitis.   Thank you for allowing me to participate in KLOEY CAZAREZ 's care.  I will follow her closely with you while hospitalized and after discharge in the outpatient department.   Lequita Asal, MD  12/04/2017, 9:08 PM

## 2017-12-04 NOTE — Progress Notes (Signed)
Per patient had c-diff test within 7 days and it was negative, per MD do not order testing

## 2017-12-04 NOTE — Progress Notes (Signed)
ANTICOAGULATION CONSULT NOTE - Initial Consult  Pharmacy Consult for heparin Indication: uterine infarct/factor V Leiden deficiency  Allergies  Allergen Reactions  . Ketamine Other (See Comments) and Anxiety    Unknown but given during previous cancer treatment and passed out after getting med  Other reaction(s): Confusion    Patient Measurements: Height: 5' 9"  (175.3 cm) Weight: 160 lb 7.9 oz (72.8 kg) IBW/kg (Calculated) : 66.2 Heparin Dosing Weight: 71.7 kg (ABW)  Vital Signs: Temp: 97.8 F (36.6 C) (02/19 1422) Temp Source: Oral (02/19 1422) BP: 142/77 (02/19 1422) Pulse Rate: 68 (02/19 1422)  Labs: Recent Labs    12/03/17 0941  12/03/17 1701 12/03/17 2355 12/04/17 0455 12/04/17 0933 12/04/17 1534  HGB 14.2  --   --   --  11.7*  --   --   HCT 41.3  --   --   --  34.1*  --   --   PLT 221  --   --   --  168  --   --   APTT  --    < > 28 59*  --  73* 84*  LABPROT  --   --  13.6  --   --   --   --   INR  --   --  1.05  --   --   --   --   HEPARINUNFRC  --   --  0.30 0.26*  --   --   --   CREATININE 0.65  --   --   --  0.53  --   --    < > = values in this interval not displayed.    Estimated Creatinine Clearance: 87.9 mL/min (by C-G formula based on SCr of 0.53 mg/dL).   Medical History: Past Medical History:  Diagnosis Date  . Anxiety   . Arthritis   . 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 Cancer ([place])    cervix  . Hepatitis C   . Hypertension   . 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 sodium chloride 75 mL/hr at 12/04/17 1028  . heparin 1,250 Units/hr (12/04/17 1303)    Assessment: 51 yof cc abdominal pain and emesis with PMH HCV, HTN, cervical cancer sp chemo/radiation followed by [facility] oncology. Had SBO last year sp ex lap with hemicolectomy by Dr. Hampton Abbot - noted factor V Leiden deficiency and started Xarelto. Pharmacy consulted to dose heparin for Factor V Leiden deficiency and uterine infarct.  Note, patient does take rivaroxaban at home and last dose was the day prior to admission.  Goal of Therapy:  Heparin level 0.3-0.7 units/ml aPTT 68 to 109 seconds Monitor platelets by anticoagulation protocol: Yes   Plan:  APTT = 73 seconds is thearapeutic. Will continue heparin drip at current infusion rate of 1250 units/hr. Will order confirmatory APTT in 6 hours. Baseline HL was elevated, so dosing/monitoring using APTT for now.  CBC ordered with AM labs tomorrow.   2/19 1534 aPTT therapeutic x 2. Continue current rate. Will recheck aPTT and HL with AM labs.  Laural Benes, PharmD, BCPS Clinical Pharmacist 12/04/17 4:14 PM

## 2017-12-04 NOTE — Progress Notes (Signed)
Initial Nutrition Assessment  DOCUMENTATION CODES:   Non-severe (moderate) malnutrition in context of chronic illness  INTERVENTION:  Provide Ensure Enlive po BID, each supplement provides 350 kcal and 20 grams of protein.  Provide daily MVI.  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 Patient was just started on cholestyramine 1 week ago, so will likely take a while longer to get dose adjusted.  Also recommend outpatient vitamin B12 injections since patient no longer has a terminal ileum and will not be able to absorb po source of vitamin B12.  NUTRITION DIAGNOSIS:   Moderate Malnutrition related to chronic illness(cervical cancer s/p chemotherapy and XRT, hx SBO s/p extensive lysis of adhesions and right ileocolectomy, chronic watery diarrhea) as evidenced by moderate fat depletion, mild muscle depletion.  GOAL:   Patient will meet greater than or equal to 90% of their needs  MONITOR:   PO intake, Supplement acceptance, Labs, Weight trends, I & O's  REASON FOR ASSESSMENT:   Malnutrition Screening Tool    ASSESSMENT:   52 year old female with PMHx of anxiety, HTN, hepatitis C, cervical cancer s/p chemotherapy and XRT, hx SBO s/p exploratory laparotomy with extensive lysis of adhesions and right ileocolectomy on 08/31/2017, chronic diarrhea since operation, factor V Leiden deficiency on Xarelto now admitted with severe left lower quadrant abdominal pain likely secondary to worsening uterine infarct.   -Patient was placed on full liquids on 2/18 and advanced to regular diet today.  Met with patient and her husband at bedside. She is known to this RD from previous admission for her ileocolectomy where she was actually on TPN for a while. She reports her appetite has never improved since her surgery. She has been having chronic watery diarrhea. She reports the diarrhea occurs all times of the day and overnight. Does not  correlate with meals. Occasionally is foul-smelling/greasy and floats, but is mainly just watery. 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 Patient reports she eats about 2 meals per day. She has breakfast and dinner. She tries to eat adequate meat/protein but also has fruits, vegetables, and salads. Reports her diet has returned to normal now after surgery (was originally on a low-fiber diet while healing). Patient enjoys Ensure and she is amenable to drinking it here.  UBW was 235 lbs prior to starting treatment in January 2018. She had reported the same thing to RD at assessment in November, but suspect weight loss likely occurred over a longer period of time. Patient was 157.3 lbs on 09/02/2017 and she has remained mostly weight stable since surgery.  Medications reviewed and include: cholestyramine 4 grams TID, sertraline, vitamin D 50000 units weekly, NS @ 75 mL/hr, heparin infusion.  Labs reviewed: CBG 99, Hgb 11.7, Hcg 34.1, RDW 15.2. On 2/12 Iron 107 (WNL), TIBC 334 (WNL), Ferritin 950 (H), folate 10.6 (WNL), CRP 1.6, vitamin D 17.2 (L), vitamin B12 165 (L).  NUTRITION - FOCUSED PHYSICAL EXAM:    Most Recent Value  Orbital Region  Moderate depletion  Upper Arm Region  Moderate depletion  Thoracic and Lumbar Region  No depletion  Buccal Region  Mild depletion  Temple Region  Moderate depletion  Clavicle Bone Region  Mild depletion  Clavicle and Acromion Bone Region  Mild depletion  Scapular Bone Region  Mild depletion  Dorsal Hand  Mild depletion  Patellar Region  No depletion  Anterior Thigh Region  No depletion  Posterior Calf Region  No depletion  Edema (RD Assessment)  None  Hair  Reviewed  Eyes  Reviewed  Mouth  Reviewed  Skin  Reviewed  Nails  Reviewed     Diet Order:  Diet regular Room service appropriate? Yes; Fluid consistency: Thin  EDUCATION NEEDS:   No education needs have been identified at  this time  Skin:  Skin Assessment: Reviewed RN Assessment(ecchymosis to right arm)  Last BM:  12/04/2017 - small type 7  Height:   Ht Readings from Last 1 Encounters:  12/03/17 5' 9"  (1.753 m)    Weight:   Wt Readings from Last 1 Encounters:  12/04/17 160 lb 7.9 oz (72.8 kg)    Ideal Body Weight:  65.9 kg  BMI:  Body mass index is 23.7 kg/m.  Estimated Nutritional Needs:   Kcal:  1700-1980 (MSJ x 1.2-1.4)  Protein:  75-90 grams (1-1.2 grams/kg)  Fluid:  2.2 L/day (30 mL/kg)  Willey Blade, MS, RD, LDN Office: [phone] Pager: [phone] After Hours/Weekend Pager: [phone]

## 2017-12-04 NOTE — Progress Notes (Signed)
ANTICOAGULATION CONSULT NOTE - Initial Consult  Pharmacy Consult for heparin Indication: uterine infarct/factor V Leiden deficiency  Allergies  Allergen Reactions  . Ketamine Other (See Comments) and Anxiety    Unknown but given during previous cancer treatment and passed out after getting med  Other reaction(s): Confusion    Patient Measurements: Height: 5' 9"  (175.3 cm) Weight: 160 lb 7.9 oz (72.8 kg) IBW/kg (Calculated) : 66.2 Heparin Dosing Weight: 71.7 kg (ABW)  Vital Signs: Temp: 98.5 F (36.9 C) (02/19 0830) Temp Source: Oral (02/19 0830) BP: 120/76 (02/19 0830) Pulse Rate: 84 (02/19 0830)  Labs: Recent Labs    12/03/17 0941 12/03/17 1701 12/03/17 2355 12/04/17 0455 12/04/17 0933  HGB 14.2  --   --  11.7*  --   HCT 41.3  --   --  34.1*  --   PLT 221  --   --  168  --   APTT  --  28 59*  --  73*  LABPROT  --  13.6  --   --   --   INR  --  1.05  --   --   --   HEPARINUNFRC  --  0.30 0.26*  --   --   CREATININE 0.65  --   --  0.53  --     Estimated Creatinine Clearance: 87.9 mL/min (by C-G formula based on SCr of 0.53 mg/dL).   Medical History: Past Medical History:  Diagnosis Date  . Anxiety   . Arthritis   . 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 Cancer ([place])    cervix  . Hepatitis C   . Hypertension   . 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 sodium chloride 75 mL/hr at 12/04/17 1028  . heparin 1,250 Units/hr (12/04/17 0339)    Assessment: 51 yof cc abdominal pain and emesis with PMH HCV, HTN, cervical cancer sp chemo/radiation followed by [facility] oncology. Had SBO last year sp ex lap with hemicolectomy by Dr. Hampton Abbot - noted factor V Leiden deficiency and started Xarelto. Pharmacy consulted to dose heparin for Factor V Leiden deficiency and uterine infarct. Note, patient does take rivaroxaban at home and last dose was the day prior to admission.  Goal of Therapy:  Heparin level 0.3-0.7  units/ml aPTT 68 to 109 seconds Monitor platelets by anticoagulation protocol: Yes   Plan:  APTT = 73 seconds is thearapeutic. Will continue heparin drip at current infusion rate of 1250 units/hr. Will order confirmatory APTT in 6 hours. Baseline HL was elevated, so dosing/monitoring using APTT for now.  CBC ordered with AM labs tomorrow.   Lenis Noon, PharmD, BCPS Clinical Pharmacist 12/04/17 11:08 AM

## 2017-12-05 DIAGNOSIS — C531 Malignant neoplasm of exocervix: Secondary | ICD-10-CM

## 2017-12-05 DIAGNOSIS — R1032 Left lower quadrant pain: Secondary | ICD-10-CM

## 2017-12-05 LAB — HEPARIN LEVEL (UNFRACTIONATED)
Heparin Unfractionated: 0.53 IU/mL (ref 0.30–0.70)
Heparin Unfractionated: 0.54 IU/mL (ref 0.30–0.70)

## 2017-12-05 LAB — CBC
HCT: 33.7 % — ABNORMAL LOW (ref 35.0–47.0)
Hemoglobin: 11.7 g/dL — ABNORMAL LOW (ref 12.0–16.0)
MCH: 35.3 pg — ABNORMAL HIGH (ref 26.0–34.0)
MCHC: 34.6 g/dL (ref 32.0–36.0)
MCV: 102 fL — ABNORMAL HIGH (ref 80.0–100.0)
PLATELETS: 142 10*3/uL — AB (ref 150–440)
RBC: 3.3 MIL/uL — ABNORMAL LOW (ref 3.80–5.20)
RDW: 14.5 % (ref 11.5–14.5)
WBC: 2 10*3/uL — ABNORMAL LOW (ref 3.6–11.0)

## 2017-12-05 LAB — APTT
APTT: 120 s — AB (ref 24–36)
aPTT: 100 seconds — ABNORMAL HIGH (ref 24–36)

## 2017-12-05 LAB — GLUCOSE, CAPILLARY: Glucose-Capillary: 83 mg/dL (ref 65–99)

## 2017-12-05 NOTE — Consult Note (Signed)
Gynecologic Oncology Consult Visit   Referring Provider: Dr. Nolon Stalls  Chief Concern: Stage IB1 cervical cancer s/p chemoradiation with recent ileocolectomy for bowel obstruction.  Concern for lung recurrence.  Subjective:  Joanna Hall is a 52 y.o. female with cervical cancer and untreated Hepatitis C.  PET scan ordered 1/19 to evaluate enlarging lung nodules with concern for recurrent cervical cancer and scan was stable and remains indetermiante.  She saw Dr. Fleet Contras at that time at [facility] and exam was stable.  She was admitted to [facility] 2 days ago with severe LLQ pain.  Had seen GI recently and started on new medications for diarrhea.  Still having considerable diarrhea.  Pain was 10/10 on admission, now 5/10. Urine negative for infection.    History The patient was diagnosed with cervical adenocarcinoma in [place] in 09/2016. She has had a long standing history of abnormal PAP smears. She presented with an ovarian cyst. Laparoscopic surgery was difficult secondary to scar tissue. Had BSO and rupture of cyst with purulent drainage into abdomen.  Had Stage IB1 (4 cm) endocervical adenocarcinoma. Radical hysterectomy aborted.   She was treated by Dr Christene Slates at [facility] in [place], [place]. Decision was made to pursue concurrent chemotherapy (weekly cisplatin) and radiation. She received treatment from 11/2016 - 05/2017. 01/2017 cisplatin x 2 and Carboplatin x 1 (01/29/17) due to ARF and XRT. XRT followed by T & O on 02/01/17 and T & N 4.28/18 and 02/20/17 . Course was complicated by weight loss (80 pounds), nausea, vomiting, electrolyte wasting (potassium and magnesium). She describes being "sick constantly" and requiring at least 20 hospitalizations.   She underwent follow-up chest CT then PET scan in 06/2017. She states that "the radiation worked" and there was no disease in the abdomen. She was noted to have lung nodules that were  growing. She was scheduled to have follow-up imaging in 10/2017.  She was admitted in [place] with a small bowel obstruction. She was managed conservatively. She was home for about a week then traveled to [place] for Thanksgiving holiday (she has family here). She presented on 08/27/2017 with nausea, vomiting, and lower abdominal pain. Symptoms did not respond to conservative measurement.   Preoperative CT on 08/26/2017 revealed SBO with transition in the pelvis just superior to the uterus where there was a long segment of distal ileum with fatty wall thickening compatible with chronic inflammation and/or radiation enteritis. She underwent laparotomy and right ileocolectomy on 08/31/2017 at [facility]. There were numerous pulmonary nodules c/w metastatic disease. Surgical findings revealed a thickened, matted, and scarred piece of distal small bowel close to the ileocecal valve. Diet was slowly advanced. She was discharged on 09/05/2017.  Abdominal and pelvic CT without contrast on 09/11/2017 revealed debris within anterior abdominal wall incision concerning for infection, versus packing material.She is s/pileocolectomy with expected postoperative changes and mild colonic ileus. There were numerous pulmonary nodules highly concerning for metastatic diseaseand punctate nonobstructing nephrolithiasis. She was readmitted on 09/12/2017. She describes the onset of lower abdominal pain on 09/09/2017. Pain markedly increased in intensity on 09/11/2017.  Staples were removed and the wound packed. She was started on doxycycline.  Abdomen and pelvic CT with contrast revealed postsurgical changes from ileocecectomy with primary ileocolic anastomosis without evidence of abscess or leak.There was edema of small bowel loops at distal ileum potentially related to surgery.There was gas within ventral midline surgical wound corresponding to wound infection versus packing  material.There was a small infarct  at inferior pole LEFT kidneyandRIGHT uterine infarct.  She denies any history of thrombosis. She denies any family history of thrombosis. She stopped smoking 10 years ago (1/4 ppd) and has a history of thoracotomy. She denies any family history of rheumatologic problems/vasculitis/autoimmune disorders.  Thrombosis work up showed she is heterozygous for Factor V Leiden and taking Xaralto.  Patient continued to have diarrhea despite Lomotil and Imodium with 15 watery BMs per day.  Dr. Hampton Abbot who did the bowel resection was managing this, and then referred to GI at [facility] 2/19.  Problem List: Patient Active Problem List   Diagnosis Date Noted  . History of Clostridium difficile infection 10/23/2017  . Factor V Leiden ([place]) 10/19/2017  . Goals of care, counseling/discussion 10/19/2017  . Abnormal cervical Papanicolaou smear 09/18/2017  . Cervical cancer, FIGO stage IB1 ([place]) 09/18/2017  . Chronic hepatitis C without hepatic coma ([place]) 09/18/2017  . Cytopenia 09/18/2017  . Diarrhea 09/18/2017  . Elevated LFTs 09/18/2017  . Erosive gastropathy 09/18/2017  . Hyponatremia 09/18/2017  . Intestinal infection due to Clostridium difficile 09/18/2017  . Lower abdominal pain 09/18/2017  . 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 Malignant neoplasm of overlapping sites of cervix ([place]) 09/18/2017  . Pelvic inflammatory disease (PID) 09/18/2017  . Renal insufficiency 09/18/2017  . Wound infection after surgery 09/12/2017  . Hypokalemia   . Hypomagnesemia   . Ileocolic anastomotic leak   . Small bowel obstruction ([place]) 08/27/2017  . Cervical arthritis 07/18/2017  . Dysuria 06/20/2017  . Metastatic cancer ([place]) 05/12/2017  . Hepatitis, chronic ([place]) 05/05/2017  . Essential hypertension 03/15/2017  . Anemia in other chronic diseases classified elsewhere 03/01/2017  . Chemotherapy-induced neutropenia ([place]) 01/29/2017  . 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[place])  09/25/2016    Past Medical History:     Past Medical History:  Diagnosis Date  . Anxiety   . Arthritis   . 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 Cancer ([place])    cervix  . Hepatitis C   . Hypertension   . Infarction of kidney ([place]) left kidney    Past Surgical History:      Past Surgical History:  Procedure Laterality Date  . CHOLECYSTECTOMY    . LAPAROTOMY N/A 08/31/2017   Procedure: EXPLORATORY LAPAROTOMY for SBO, ileocolectomy, removal of piece of uterine wall;  Surgeon: [name], MD;  Location: [facility] ORS;  Service: General;  Laterality: N/A;  . OOPHORECTOMY       Family History: History reviewed. No pertinent family history.  Social History: Social History        Socioeconomic History  . Marital status: Legally Separated    Spouse name: Not on file  . Number of children: Not on file  . Years of education: Not on file  . Highest education level: Not on file  Social Needs  . Financial resource strain: Not on file  . Food insecurity - worry: Not on file  . Food insecurity - inability: Not on file  . Transportation needs - medical: Not on file  . Transportation needs - non-medical: Not on file  Occupational History  . Not on file  Tobacco Use  . Smoking status: Former Smoker    Last attempt to quit: 10/16/2006    Years since quitting: 11.0  . Smokeless tobacco: Never Used  Substance and Sexual Activity  . Alcohol use: No    Frequency: Never  . Drug use: No  . Sexual activity: Not on file  Other Topics Concern  . Not on file  Social History Narrative  . Not on file    Allergies:      Allergies  Allergen Reactions  . Ketamine Other (See Comments)    Unknown but given during previous cancer treatment and passed out after getting med    No current facility-administered medications on file prior to encounter.    Current Outpatient Medications on File Prior to Encounter  Medication Sig Dispense  Refill  . acetaminophen (TYLENOL) 325 MG tablet Take 650 mg every 6 (six) hours as needed by mouth.    . cholestyramine (QUESTRAN) 4 g packet Take 1 packet (4 g total) by mouth 3 (three) times daily with meals. 90 packet 0  . cyanocobalamin (,VITAMIN B-12,) 1000 MCG/ML injection Inject 1ML every week x 4 weeks, then 1ML every other week x 2 months, then monthly x 3 months 10 mL 1  . dicyclomine (BENTYL) 10 MG capsule Take 1 capsule (10 mg total) by mouth 4 (four) times daily -  before meals and at bedtime. 120 capsule 0  . diphenoxylate-atropine (LOMOTIL) 2.5-0.025 MG tablet Take 2 tablets by mouth 4 (four) times daily as needed for diarrhea or loose stools. Titrate down after 48 hours 50 tablet 0  . hydrocortisone (ANUSOL-HC) 2.5 % rectal cream Place 1 application rectally 2 (two) times daily. 30 g 0  . loperamide (IMODIUM A-D) 2 MG tablet Take 2 mg by mouth 4 (four) times daily as needed for diarrhea or loose stools.    . Multiple Vitamin (MULTIVITAMIN WITH MINERALS) TABS tablet Take 1 tablet daily by mouth.    . rivaroxaban (XARELTO) 20 MG TABS tablet Take 1 tablet (20 mg total) by mouth daily with supper. 30 tablet 0  . sertraline (ZOLOFT) 50 MG tablet Take 50 mg daily by mouth.    . Syringe/Needle, Disp, (SYRINGE 3CC/22GX1") 22G X 1" 3 ML MISC For use with Vitamin B12 injections 50 each 0  . VENTOLIN HFA 108 (90 Base) MCG/ACT inhaler INHALE 1-2 PUFFS BY MOUTH EVERY 4 HOURS AS NEEDED FOR WHEEZING / SHORTNESS OF BREATH  0  . Vitamin D, Ergocalciferol, (DRISDOL) 50000 units CAPS capsule Take 1 capsule (50,000 Units total) by mouth every 7 (seven) days for 12 doses. 12 capsule 0  . Rivaroxaban (XARELTO) 15 MG TABS tablet Take 1 tablet (15 mg total) by mouth every 12 (twelve) hours. (Patient not taking: Reported on 12/03/2017) 42 tablet 0    Review of Systems Per interval history.  Objective:  Physical Examination:  BP (!) 144/98   Pulse 94   Temp 97.7 F (36.5 C) (Tympanic)   Resp 18   Ht  5' 9"  (1.753 m)   Wt 156 lb (70.8 kg)   BMI 23.04 kg/m    ECOG Performance Status: 2 Symptomatic  General appearance: alert, cooperative and appears stated age HEENT:neck supple with midline trachea and thyroid without masses Lymph node survey: non-palpable, axillary, inguinal, supraclavicular Cardiovascular: regular rate and rhythm, no murmurs or gallops Respiratory: normal air entry, lungs clear to auscultation and no rales, rhonchi or wheezing Breast exam: not examined. Abdomen: no hernias. Incision now well healed.  Nontender and not distended, with quiet bowel sounds and no mass.  Back: inspection of back is normal Extremities: extremities normal, atraumatic, no cyanosis or edema Skin exam - normal coloration and turgor, no rashes, no suspicious skin lesions noted. Neurological exam reveals alert, oriented, normal speech, no focal findings or movement disorder noted.  Pelvic: exam chaperoned  by nurse; Vulva normal, Vagina shows radiation changes and some filmy adhesions in upper vault that were broken down.  No  necrosis of upper vault, or nodularity or odor. Bimanual/RV:  No masses or nodularity.        Assessment:  Joanna Hall is a 52 y.o. female diagnosed with probable recurrent stage IB1 cervical adenocarcinoma s/p aborted radical hysterectomy due to infected adnexa 2/18 at [facility].  Then had chemoradiation at [facility] with apparent complete response.  Developed bowel obstruction and had resection of terminal ileum and cecum 11/18 at [facility].  Has significant diarrhea that is being managed medically by the surgeon who did the bowel resection.  Now admitted with severe LLLQ pain of unclear etiology. Possible causes include recurrence of her cancer (PET and exam reassuring though, except for indeterminate lung nodules) or possibly radiation proctitis or kidney stone (but she has no history of stones).   Medical co-morbidities complicating care:untreated Hepatitis C .  Plan:    I  would suggest upper and lower GI endoscopy to evaluate pain and diarrhea.  GI had seen her recently and was probably planning to do this soon anyway and it could reveal a source of her pain.  For example, she might have severe radiation changes in the sigmoid colon.  Discussed this plan with Dr Mike Gip and she is in agreement.   Patient has known hepatitis C that has not been treated, and this is being deferred until the status of her cancer becomes clearer.    The patient's diagnosis, an outline of the further diagnostic and laboratory studies which will be required, the recommendation, and alternatives were discussed.  All questions were answered to the patient's satisfaction.   Mellody Drown, MD   Dr. Nolon Stalls. [facility]  Dr. Fleet Contras at [facility] [phone])

## 2017-12-05 NOTE — Progress Notes (Signed)
ANTICOAGULATION CONSULT NOTE - Initial Consult  Pharmacy Consult for heparin Indication: uterine infarct/factor V Leiden deficiency  Allergies  Allergen Reactions  . Ketamine Other (See Comments) and Anxiety    Unknown but given during previous cancer treatment and passed out after getting med  Other reaction(s): Confusion    Patient Measurements: Height: 5' 9"  (175.3 cm) Weight: 161 lb 9.6 oz (73.3 kg) IBW/kg (Calculated) : 66.2 Heparin Dosing Weight: 71.7 kg (ABW)  Vital Signs: Temp: 97.8 F (36.6 C) (02/20 0431) Temp Source: Oral (02/20 0431) BP: 141/84 (02/20 0431) Pulse Rate: 64 (02/20 0431)  Labs: Recent Labs    12/03/17 0941  12/03/17 1701 12/03/17 2355 12/04/17 0455 12/04/17 0933 12/04/17 1534 12/05/17 0648  HGB 14.2  --   --   --  11.7*  --   --  11.7*  HCT 41.3  --   --   --  34.1*  --   --  33.7*  PLT 221  --   --   --  168  --   --  142*  APTT  --    < > 28 59*  --  73* 84* 120*  LABPROT  --   --  13.6  --   --   --   --   --   INR  --   --  1.05  --   --   --   --   --   HEPARINUNFRC  --   --  0.30 0.26*  --   --   --  0.53  CREATININE 0.65  --   --   --  0.53  --   --   --    < > = values in this interval not displayed.    Estimated Creatinine Clearance: 87.9 mL/min (by C-G formula based on SCr of 0.53 mg/dL).   Medical History: Past Medical History:  Diagnosis Date  . Anxiety   . Arthritis   . 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 Cancer ([place])    cervix  . Hepatitis C   . Hypertension   . Infarction of kidney ([place]) left kidney    Medications:  Infusions:  . sodium chloride 75 mL/hr at 12/04/17 2012  . heparin 1,250 Units/hr (12/04/17 1303)    Assessment: 51 yof cc abdominal pain and emesis with PMH HCV, HTN, cervical cancer sp chemo/radiation followed by [facility] oncology. Had SBO last year sp ex lap with hemicolectomy by Dr. Hampton Abbot - noted factor V Leiden deficiency and started Xarelto. Pharmacy consulted to dose  heparin for Factor V Leiden deficiency and uterine infarct. Note, patient does take rivaroxaban at home and last dose was the day prior to admission.  Goal of Therapy:  Heparin level 0.3-0.7 units/ml aPTT 68 to 109 seconds Monitor platelets by anticoagulation protocol: Yes   Plan:  APTT = 73 seconds is thearapeutic. Will continue heparin drip at current infusion rate of 1250 units/hr. Will order confirmatory APTT in 6 hours. Baseline HL was elevated, so dosing/monitoring using APTT for now.  CBC ordered with AM labs tomorrow.   2/19 1534 aPTT therapeutic x 2. Continue current rate. Will recheck aPTT and HL with AM labs.  2/20 AM labs HL 0.53 therapeutic, aPTT slightly elevated at 120, hgb stable, plt 142. HL is therapeutic now. Continue at current rate.  Will recheck labs in 6h. CBC in AM  Rocky Morel, PharmD, BCPS Clinical Pharmacist 12/05/17 8:38 AM

## 2017-12-05 NOTE — Progress Notes (Signed)
Joanna Hall NAME: Joanna Hall    MR#:  [id number]  DATE OF BIRTH:  05/03/1966  SUBJECTIVE:  CHIEF COMPLAINT:   Chief Complaint  Patient presents with  . Abdominal Pain  . Emesis  Patient without complaint, case discussed with oncology/gynecology-Dr. Fransisca Connors patient to be evaluated later today, continue heparin drip for now  REVIEW OF SYSTEMS:  CONSTITUTIONAL: No fever, fatigue or weakness.  EYES: No blurred or double vision.  EARS, NOSE, AND THROAT: No tinnitus or ear pain.  RESPIRATORY: No cough, shortness of breath, wheezing or hemoptysis.  CARDIOVASCULAR: No chest pain, orthopnea, edema.  GASTROINTESTINAL: No nausea, vomiting, diarrhea or abdominal pain.  GENITOURINARY: No dysuria, hematuria.  ENDOCRINE: No polyuria, nocturia,  HEMATOLOGY: No anemia, easy bruising or bleeding SKIN: No rash or lesion. MUSCULOSKELETAL: No joint pain or arthritis.   NEUROLOGIC: No tingling, numbness, weakness.  PSYCHIATRY: No anxiety or depression.   ROS  DRUG ALLERGIES:   Allergies  Allergen Reactions  . Ketamine Other (See Comments) and Anxiety    Unknown but given during previous cancer treatment and passed out after getting med  Other reaction(s): Confusion    VITALS:  Blood pressure (!) 141/84, pulse 64, temperature 97.8 F (36.6 C), temperature source Oral, resp. rate 16, height 5' 9"  (1.753 m), weight 73.3 kg (161 lb 9.6 oz), SpO2 100 %.  PHYSICAL EXAMINATION:  GENERAL:  52 y.o.-year-old patient lying in the bed with no acute distress.  EYES: Pupils equal, round, reactive to light and accommodation. No scleral icterus. Extraocular muscles intact.  HEENT: Head atraumatic, normocephalic. Oropharynx and nasopharynx clear.  NECK:  Supple, no jugular venous distention. No thyroid enlargement, no tenderness.  LUNGS: Normal breath sounds bilaterally, no wheezing, rales,rhonchi or crepitation. No use of accessory muscles of  respiration.  CARDIOVASCULAR: S1, S2 normal. No murmurs, rubs, or gallops.  ABDOMEN: Soft, nontender, nondistended. Bowel sounds present. No organomegaly or mass.  EXTREMITIES: No pedal edema, cyanosis, or clubbing.  NEUROLOGIC: Cranial nerves II through XII are intact. Muscle strength 5/5 in all extremities. Sensation intact. Gait not checked.  PSYCHIATRIC: The patient is alert and oriented x 3.  SKIN: No obvious rash, lesion, or ulcer.   Physical Exam LABORATORY PANEL:   CBC Recent Labs  Lab 12/05/17 0648  WBC 2.0*  HGB 11.7*  HCT 33.7*  PLT 142*   ------------------------------------------------------------------------------------------------------------------  Chemistries  Recent Labs  Lab 12/03/17 0941 12/04/17 0455  NA 134* 135  K 3.8 3.9  CL 102 106  CO2 22 22  GLUCOSE 103* 83  BUN 13 6  CREATININE 0.65 0.53  CALCIUM 9.8 8.9  AST 42*  --   ALT 45  --   ALKPHOS 202*  --   BILITOT 0.6  --    ------------------------------------------------------------------------------------------------------------------  Cardiac Enzymes No results for input(s): TROPONINI in the last 168 hours. ------------------------------------------------------------------------------------------------------------------  RADIOLOGY:  Mr Pelvis W Wo Contrast  Result Date: 12/03/2017 CLINICAL DATA:  Pelvic pain.  Remote history of cervical cancer. EXAM: MRI PELVIS WITHOUT AND WITH CONTRAST TECHNIQUE: Multiplanar multisequence MR imaging of the pelvis was performed both before and after administration of intravenous contrast. CONTRAST:  9m MULTIHANCE GADOBENATE DIMEGLUMINE 529 MG/ML IV SOLN COMPARISON:  Multiple prior CT scans. FINDINGS: Urinary Tract: The bladder is unremarkable. No bladder mass or asymmetric bladder wall thickening. Both kidneys appear normal. No hydronephrosis. Bowel:  The visualized small bowel and colon are grossly normal. Vascular/Lymphatic: The major vascular structures  appear normal. No  pelvic lymphadenopathy. No inguinal adenopathy. Reproductive: Remote scarring type changes involving the uterus. Diffuse low T2 signal intensity throughout the myometrium may be related to prior radiation. No acute abnormality or evidence of mass. The left ovary is identified and appears normal. I do not see the right ovary for certain. Other: No free pelvic fluid collections or pelvic mass. Mild presacral edema. Musculoskeletal: No significant bony findings. Advanced disc disease at L4-5 and L5-S1. IMPRESSION: 1. Unremarkable pelvic MRI. No acute findings, mass lesions or adenopathy. 2. Remote scarring type changes involving the uterus and possible radiation changes. No acute abnormality. No obvious findings for recurrent cervical cancer. Electronically Signed   By: Marijo Sanes M.D.   On: 12/03/2017 21:52    ASSESSMENT AND PLAN:  52 year old female patient with multiple medical problems of chronic hep C, history of cervical cancer with chemo, radiation therapy, history of small bowel obstruction status post ex lap with hemicolectomy, factor V Leiden deficiency, chronic diarrhea comes in today with severe abdominal pain and emesis. 1.    Acute abdominal/pelvic pain Improved Was thought to be due to acute uterine infarct-but this has been ruled out, MRI of the abdomen negative, continue pain control measures, currently on heparin drip, for oncology/gynecology evaluation-await further recommendations, most likely will be able to transition back to Xarelto, oncology following regarding factor V Leiden deficiency, IV fluids for rehydration, antiemetics PRN, advance diet as tolerated   2.  Pulmonary nodules Followed by [facility] closely  3.  Acute on chronic diarrhea Stable Continue cholestyramine and Imodium as needed   All the records are reviewed and case discussed with Care Management/Social Workerr. Management plans discussed with the patient, family and they are in agreement.  CODE  STATUS: full  TOTAL TIME TAKING CARE OF THIS PATIENT: 45 minutes.     POSSIBLE D/C IN 1-2 DAYS, DEPENDING ON CLINICAL CONDITION.   Avel Peace Joanna Hall M.D on 12/05/2017   Between 7am to 6pm - Pager - [phone]  After 6pm go to [email or web address] - password [email or web address] [facility]  Office  [phone]  CC: Primary care physician; No primary care provider on file.  Note: This dictation was prepared with Dragon dictation along with smaller phrase technology. Any transcriptional errors that result from this process are unintentional.

## 2017-12-05 NOTE — Progress Notes (Addendum)
ANTICOAGULATION CONSULT NOTE - Initial Consult  Pharmacy Consult for heparin Indication: uterine infarct/factor V Leiden deficiency  Allergies  Allergen Reactions  . Ketamine Other (See Comments) and Anxiety    Unknown but given during previous cancer treatment and passed out after getting med  Other reaction(s): Confusion    Patient Measurements: Height: 5' 9"  (175.3 cm) Weight: 161 lb 9.6 oz (73.3 kg) IBW/kg (Calculated) : 66.2 Heparin Dosing Weight: 71.7 kg (ABW)  Vital Signs: Temp: 97.8 F (36.6 C) (02/20 0431) Temp Source: Oral (02/20 0839) BP: 141/84 (02/20 0431) Pulse Rate: 64 (02/20 0431)  Labs: Recent Labs    12/03/17 0941  12/03/17 1701 12/03/17 2355 12/04/17 0455  12/04/17 1534 12/05/17 0648 12/05/17 1319  HGB 14.2  --   --   --  11.7*  --   --  11.7*  --   HCT 41.3  --   --   --  34.1*  --   --  33.7*  --   PLT 221  --   --   --  168  --   --  142*  --   APTT  --    < > 28 59*  --    < > 84* 120* 100*  LABPROT  --   --  13.6  --   --   --   --   --   --   INR  --   --  1.05  --   --   --   --   --   --   HEPARINUNFRC  --    < > 0.30 0.26*  --   --   --  0.53 0.54  CREATININE 0.65  --   --   --  0.53  --   --   --   --    < > = values in this interval not displayed.    Estimated Creatinine Clearance: 87.9 mL/min (by C-G formula based on SCr of 0.53 mg/dL).   Medical History: Past Medical History:  Diagnosis Date  . Anxiety   . Arthritis   . 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 Cancer ([place])    cervix  . Hepatitis C   . Hypertension   . 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 sodium chloride 75 mL/hr at 12/04/17 2012  . heparin 1,250 Units/hr (12/05/17 1020)    Assessment: 51 yof cc abdominal pain and emesis with PMH HCV, HTN, cervical cancer sp chemo/radiation followed by [facility] oncology. Had SBO last year sp ex lap with hemicolectomy by Dr. Hampton Abbot - noted factor V Leiden deficiency and  started Xarelto. Pharmacy consulted to dose heparin for Factor V Leiden deficiency and uterine infarct. Note, patient does take rivaroxaban at home and last dose was the day prior to admission.  Goal of Therapy:  Heparin level 0.3-0.7 units/ml aPTT 68 to 109 seconds Monitor platelets by anticoagulation protocol: Yes   Plan:  APTT = 73 seconds is thearapeutic. Will continue heparin drip at current infusion rate of 1250 units/hr. Will order confirmatory APTT in 6 hours. Baseline HL was elevated, so dosing/monitoring using APTT for now.  CBC ordered with AM labs tomorrow.   2/19 1534 aPTT therapeutic x 2. Continue current rate. Will recheck aPTT and HL with AM labs.  2/20 AM labs HL 0.53 therapeutic, aPTT slightly elevated at 120, hgb stable, plt 142. HL is therapeutic now.  Continue at current rate.  Will recheck labs in 6h. CBC in AM  2/20 1320 labs HL 0.54, aPTT 100 - both therapeutic. Can dose based on HL now. Continue at current rate. Recheck in AM, CBC in AM.   Rocky Morel, PharmD, BCPS Clinical Pharmacist 12/05/17 2:51 PM   02/21 AM heparin level 0.41. Continue current regimen. Recheck heparin level and CBC with tomorrow AM labs.  Sim Boast, PharmD, BCPS  12/06/17 5:30 AM   \

## 2017-12-06 LAB — HEPARIN LEVEL (UNFRACTIONATED): Heparin Unfractionated: 0.41 IU/mL (ref 0.30–0.70)

## 2017-12-06 LAB — CBC
HEMATOCRIT: 33.2 % — AB (ref 35.0–47.0)
HEMOGLOBIN: 11.6 g/dL — AB (ref 12.0–16.0)
MCH: 35.1 pg — AB (ref 26.0–34.0)
MCHC: 34.9 g/dL (ref 32.0–36.0)
MCV: 100.8 fL — AB (ref 80.0–100.0)
Platelets: 144 10*3/uL — ABNORMAL LOW (ref 150–440)
RBC: 3.29 MIL/uL — AB (ref 3.80–5.20)
RDW: 14.5 % (ref 11.5–14.5)
WBC: 2.4 10*3/uL — ABNORMAL LOW (ref 3.6–11.0)

## 2017-12-06 NOTE — Progress Notes (Signed)
[facility] Hematology/Oncology Progress Note  Date of admission: 12/03/2017  Hospital day:  12/06/2017  Chief Complaint: Joanna Hall is a 52 y.o. female with cervical cancer and left kidney and right uterine infarct who was admitted through the emergency room with abdominal pain.  Subjective: Feeling better today.  Left lower quadrant pain 2-3 out of 10.  Eating normally.  Stools more firm.  Social History: The patient is accompanied by her mother today.  Allergies:  Allergies  Allergen Reactions  . Ketamine Other (See Comments) and Anxiety    Unknown but given during previous cancer treatment and passed out after getting med  Other reaction(s): Confusion    Scheduled Medications: . cholestyramine  4 g Oral TID WC  . dicyclomine  10 mg Oral TID AC & HS  . feeding supplement (ENSURE ENLIVE)  237 mL Oral BID BM  . hydrocortisone  1 application Rectal BID  . multivitamin with minerals  1 tablet Oral Daily  . sertraline  50 mg Oral Daily  . Vitamin D (Ergocalciferol)  50,000 Units Oral Q7 days    Review of Systems: GENERAL: Feeling better.  No fevers orsweats.  PERFORMANCE STATUS (ECOG):2 HEENT:No visual changes,runny nose,sore throat, mouth sores or tenderness. Lungs: No shortness of breath or cough. No hemoptysis. Cardiac:No chest pain, palpitations, orthopnea, or PND. AX:KPVVZ movements more solid.  No constipation, melena or hematochezia. GU: Some dysuria.  Urgency.  No frequency or hematuria.h/o recurrent UTIs. Musculoskeletal:Osteoarthritis in shoulders and neck.No back pain. No muscle tenderness. Extremities: No pain or swelling. Skin: No rashes or skin changes. Neuro: No headache, numbness or weakness, balance or coordination issues. Endocrine: No diabetes, thyroid issues, hot flashes or night sweats. Psych: No mood changes, depression or anxiety. Pain: Lower abdominal pain, improved (2-3 out of 10). Review of  systems: All other systems reviewed and found to be negative.  Physical Exam: Blood pressure 127/80, pulse 73, temperature 98.2 F (36.8 C), temperature source Oral, resp. rate 18, height 5' 9"  (1.753 m), weight 162 lb 11.2 oz (73.8 kg), SpO2 99 %.  GENERAL:Well developed well nourished womanlyingcomfortably on the medical unit in no acute distress. MENTAL STATUS: Alert and oriented to person, place and time. HEAD:Long brownhair. Normocephalic, atraumatic, face symmetric, no Cushingoid features. EYES:Blueeyes. Pupils equal round and reactive to light and accomodation. No conjunctivitis or scleral icterus. SMO:LMBEMLJQGB clear without lesion. Tonguenormal.No upper teeth.Mucous membranes moist. RESPIRATORY:Clear to auscultationwithout rales, wheezes or rhonchi. CARDIOVASCULAR:Regular rate andrhythmwithout murmur, rub or gallop. ABDOMEN:Soft, lower abdominal tenderness (predominantly LLQ but extends across lower abdomen).  No guarding or rebound tenderness.Active bowel sounds and no hepatosplenomegaly. No masses. SKIN:No rashes, ulcers, or lesions. EXTREMITIES:No edema, no skin discoloration or tenderness. No palpable cords. LYMPHNODES: No palpable cervical, supraclavicular, axillary or inguinal adenopathy  NEUROLOGICAL: Unremarkable. PSYCH: Appropriate.   Results for orders placed or performed during the hospital encounter of 12/03/17 (from the past 48 hour(s))  CBC     Status: Abnormal   Collection Time: 12/05/17  6:48 AM  Result Value Ref Range   WBC 2.0 (L) 3.6 - 11.0 K/uL   RBC 3.30 (L) 3.80 - 5.20 MIL/uL   Hemoglobin 11.7 (L) 12.0 - 16.0 g/dL   HCT 33.7 (L) 35.0 - 47.0 %   MCV 102.0 (H) 80.0 - 100.0 fL   MCH 35.3 (H) 26.0 - 34.0 pg   MCHC 34.6 32.0 - 36.0 g/dL   RDW 14.5 11.5 - 14.5 %   Platelets 142 (L) 150 - 440 K/uL  Comment: Performed at [facility], [address] [place], [place] [address]  APTT     Status: Abnormal    Collection Time: 12/05/17  6:48 AM  Result Value Ref Range   aPTT 120 (H) 24 - 36 seconds    Comment:        IF BASELINE aPTT IS ELEVATED, SUGGEST PATIENT RISK ASSESSMENT BE USED TO DETERMINE APPROPRIATE ANTICOAGULANT THERAPY. Performed at [facility], [place], [place] [address]   Heparin level (unfractionated)     Status: None   Collection Time: 12/05/17  6:48 AM  Result Value Ref Range   Heparin Unfractionated 0.53 0.30 - 0.70 IU/mL    Comment:        IF HEPARIN RESULTS ARE BELOW EXPECTED VALUES, AND PATIENT DOSAGE HAS BEEN CONFIRMED, SUGGEST FOLLOW UP TESTING OF ANTITHROMBIN III LEVELS. Performed at [facility], [address] [place], [place] [address]   Glucose, capillary     Status: None   Collection Time: 12/05/17  7:58 AM  Result Value Ref Range   Glucose-Capillary 83 65 - 99 mg/dL  Heparin level (unfractionated)     Status: None   Collection Time: 12/05/17  1:19 PM  Result Value Ref Range   Heparin Unfractionated 0.54 0.30 - 0.70 IU/mL    Comment:        IF HEPARIN RESULTS ARE BELOW EXPECTED VALUES, AND PATIENT DOSAGE HAS BEEN CONFIRMED, SUGGEST FOLLOW UP TESTING OF ANTITHROMBIN III LEVELS. Performed at [facility], [address] [place], [place] [address]   APTT     Status: Abnormal   Collection Time: 12/05/17  1:19 PM  Result Value Ref Range   aPTT 100 (H) 24 - 36 seconds    Comment:        IF BASELINE aPTT IS ELEVATED, SUGGEST PATIENT RISK ASSESSMENT BE USED TO DETERMINE APPROPRIATE ANTICOAGULANT THERAPY. Performed at [facility], [address] [place], [place] [address]   CBC     Status: Abnormal   Collection Time: 12/06/17  4:11 AM  Result Value Ref Range   WBC 2.4 (L) 3.6 - 11.0 K/uL   RBC 3.29 (L) 3.80 - 5.20 MIL/uL   Hemoglobin 11.6 (L) 12.0 - 16.0 g/dL   HCT 33.2 (L) 35.0 - 47.0 %   MCV 100.8 (H) 80.0 - 100.0 fL   MCH 35.1 (H) 26.0 - 34.0 pg   MCHC 34.9 32.0 - 36.0 g/dL   RDW  14.5 11.5 - 14.5 %   Platelets 144 (L) 150 - 440 K/uL    Comment: Performed at [facility], [place], [place] [address]  Heparin level (unfractionated)     Status: None   Collection Time: 12/06/17  4:11 AM  Result Value Ref Range   Heparin Unfractionated 0.41 0.30 - 0.70 IU/mL    Comment:        IF HEPARIN RESULTS ARE BELOW EXPECTED VALUES, AND PATIENT DOSAGE HAS BEEN CONFIRMED, SUGGEST FOLLOW UP TESTING OF ANTITHROMBIN III LEVELS. Performed at [facility], [address] [place], [place] [address]    No results found.  Assessment:  Joanna Hall is a 52 y.o. female with a history ofstage IB1cervical cancers/p concurrent cisplatin and radiation(external beam and brachytherapy) from 11/2016 - 02/2017. She was treated by Dr Christene Slates at [facility] in [place], [place].Course was complicated by weight loss (80 pounds), nausea, vomiting, electrolyte wasting (potassium and magnesium), and multiple hospitalizations.  PET  scanon 11/08/2017 at the [place] [place] revealed innumerable stable bilateral cavitary pulmonary nodules (up to 8 mm), some of which demonstrate low level metabolic activity.  There were post treatment changes in the pelvis without evidence of suspicious hypermetabolic activity.  She is s/p right ileocolectomyon 08/31/2017 for a small bowel obstruction. She presented with abdominal pain. 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  Hypercoagulable work-upon 09/14/2017 revealed heterozygosity for Factor V Leiden(single [id number] mutation). She was discharged on Xarelto.  She presented with new lower abdominal pain most pronounced in the LLQ.  Abdomen and pelvic CT suggested possible worsening or progressive uterine infarction.  There were no other acute changes.  Pelvic MRI revealed  evolving radiation changes.  Plan:   1.  Oncology:  Patient with cervical cancer.  Recent PET scan shows stable lung nodules.  Abdominal imaging reveals no evidence of recurrent disease.  2.  Hematology:  Images reviewed with radiology do not suggest uterine infarct on Xarelto.  Etiology of lower abdominal pain unclear.  Appreciate Gyn-Onc evaluation.  Continue heparin for now.  Anticipate discharge home on Xarelto after procedures completed by GI.  3.  Nephrology:   No history of renal stones.  Urinalysis on 12/03/2017 revealed no hematuria.  4.  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 She was recently started on cholestyramine. Scans reveal no evidence of diverticulitis.  LLQ pain, improved.  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  Planned colonoscopy tomorrow.  5.  Disposition:  Anticipate discharge to outpatient department in next 1-2 days.   Lequita Asal, MD  12/06/2017, 9:44 PM

## 2017-12-06 NOTE — Progress Notes (Signed)
Assigned to pt from 1515-1900. Norco given once for abd pain with improvement. Pt had more formed stool today per pt. Heparin drip continues at 12.25m/hr.

## 2017-12-06 NOTE — Consult Note (Signed)
Joanna Hall , MD [address], [place], [place], [place], [address] [id number], [place], [place], [place], [address] Phone: [phone]  Fax: [phone]  Consultation  Referring Provider:  Dr Jerelyn Charles  Primary Care Physician:  No primary care provider on file. Primary Gastroenterologist:  Dr. Marius Ditch          Reason for Consultation:     Abdominal pain   Date of Admission:  12/03/2017 Date of Consultation:  12/06/2017         HPI:   Joanna Hall is a 52 y.o. female who is known to Dr Marius Ditch with [facility] and lasty seen on 11/27/17 for chronic diarrhea. She has a history of cervical cancer s/p chemotherapy and XRT, SBO in 08/2017 , underwent Ex lap with ileo-rt hemi colectomy and primary anastomosis felt secondary to adhesions. 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 Since the abdominal surgery she has had severe diarrhea, h/o c diff in the past . She has Chronic hepatitis C , treatment naive. H/o NSAID use.   She was admitted on 12/03/17 with severe abdominal pain that began the day before  , CT scan of the abdomen showed progression of the uterine infarct .She had a pelvic MRI on 12/03/17 which was normal .  She is presently on a Heparin drip.   Past Medical History:  Diagnosis Date  . Anxiety   . Arthritis   . 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 Cancer ([place])    cervix  . Hepatitis C   . Hypertension   . 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 CHOLECYSTECTOMY    . LAPAROTOMY N/A 08/31/2017   Procedure: EXPLORATORY LAPAROTOMY for SBO, ileocolectomy, removal of piece of uterine wall;  Surgeon: [name], MD;  Location: [facility] ORS;  Service: General;  Laterality: N/A;  . OOPHORECTOMY      Prior to Admission medications   Medication Sig Start Date End Date Taking? Authorizing Provider  acetaminophen (TYLENOL) 325 MG tablet Take 650 mg every 6 (six) hours  as needed by mouth.   Yes [provider]  cholestyramine (QUESTRAN) 4 g packet Take 1 packet (4 g total) by mouth 3 (three) times daily with meals. 11/27/17 12/27/17 Yes Vanga, Tally Due, MD  cyanocobalamin (,VITAMIN B-12,) 1000 MCG/ML injection Inject 1ML every week x 4 weeks, then 1ML every other week x 2 months, then monthly x 3 months 11/29/17  Yes Vanga, Tally Due, MD  dicyclomine (BENTYL) 10 MG capsule Take 1 capsule (10 mg total) by mouth 4 (four) times daily -  before meals and at bedtime. 11/27/17 12/27/17 Yes Vanga, Tally Due, MD  diphenoxylate-atropine (LOMOTIL) 2.5-0.025 MG tablet Take 2 tablets by mouth 4 (four) times daily as needed for diarrhea or loose stools. Titrate down after 48 hours 11/14/17  Yes Piscoya, Jose, MD  hydrocortisone (ANUSOL-HC) 2.5 % rectal cream Place 1 application rectally 2 (two) times daily. 11/15/17  Yes Piscoya, Jacqulyn Bath, MD  loperamide (IMODIUM A-D) 2 MG tablet Take 2 mg by mouth 4 (four) times daily as needed for diarrhea or loose stools.   Yes [provider]  Multiple Vitamin (MULTIVITAMIN WITH MINERALS) TABS tablet Take 1 tablet daily by mouth.   Yes [provider]  rivaroxaban (XARELTO) 20 MG TABS tablet Take 1 tablet (20 mg total) by mouth daily with supper. 10/05/17  Yes  Vickie Epley, MD  sertraline (ZOLOFT) 50 MG tablet Take 50 mg daily by mouth.   Yes [provider]  Syringe/Needle, Disp, (SYRINGE 3CC/22GX1") 22G X 1" 3 ML MISC For use with Vitamin B12 injections 11/29/17  Yes Vanga, Tally Due, MD  VENTOLIN HFA 108 (90 Base) MCG/ACT inhaler INHALE 1-2 PUFFS BY MOUTH EVERY 4 HOURS AS NEEDED FOR WHEEZING / SHORTNESS OF BREATH 08/02/17  Yes [provider]  Vitamin D, Ergocalciferol, (DRISDOL) 50000 units CAPS capsule Take 1 capsule (50,000 Units total) by mouth every 7 (seven) days for 12 doses. 11/29/17 02/15/18 Yes Vanga, Tally Due, MD  Rivaroxaban (XARELTO) 15 MG TABS tablet Take 1 tablet (15 mg  total) by mouth every 12 (twelve) hours. Patient not taking: Reported on 12/03/2017 11/01/17   Jules Husbands, MD    No family history on file.   Social History   Tobacco Use  . Smoking status: Former Smoker    Last attempt to quit: 10/16/2006    Years since quitting: 11.1  . Smokeless tobacco: Never Used  Substance Use Topics  . Alcohol use: No    Frequency: Never  . Drug use: No    Allergies as of 12/03/2017 - Review Complete 12/03/2017  Allergen Reaction Noted  . Ketamine Other (See Comments) and Anxiety 02/22/2017    Review of Systems:    All systems reviewed and negative except where noted in HPI.   Physical Exam:  Vital signs in last 24 hours: Temp:  [97.5 F (36.4 C)-98.4 F (36.9 C)] 97.5 F (36.4 C) (02/21 0457) Pulse Rate:  [57-70] 57 (02/21 0457) Resp:  [16] 16 (02/21 0457) BP: (132-155)/(73-83) 145/83 (02/21 0457) SpO2:  [99 %-100 %] 100 % (02/21 0457) Weight:  [162 lb 11.2 oz (73.8 kg)] 162 lb 11.2 oz (73.8 kg) (02/21 0457) Last BM Date: 12/05/17 General:   Pleasant, cooperative in NAD Head:  Normocephalic and atraumatic. Eyes:   No icterus.   Conjunctiva pink. PERRLA. Ears:  Normal auditory acuity. Neck:  Supple; no masses or thyroidomegaly Lungs: Respirations even and unlabored. Lungs clear to auscultation bilaterally.   No wheezes, crackles, or rhonchi.  Heart:  Regular rate and rhythm;  Without murmur, clicks, rubs or gallops Abdomen:  Soft, nondistended, nontender. Normal bowel sounds. No appreciable masses or hepatomegaly.  No rebound or guarding.  Neurologic:  Alert and oriented x3;  grossly normal neurologically. Skin:  Intact without significant lesions or rashes. Cervical Nodes:  No significant cervical adenopathy. Psych:  Alert and cooperative. Normal affect.  LAB RESULTS: Recent Labs    12/04/17 0455 12/05/17 0648 12/06/17 0411  WBC 2.4* 2.0* 2.4*  HGB 11.7* 11.7* 11.6*  HCT 34.1* 33.7* 33.2*  PLT 168 142* 144*   BMET Recent Labs     12/03/17 0941 12/04/17 0455  NA 134* 135  K 3.8 3.9  CL 102 106  CO2 22 22  GLUCOSE 103* 83  BUN 13 6  CREATININE 0.65 0.53  CALCIUM 9.8 8.9   LFT Recent Labs    12/03/17 0941  PROT 9.3*  ALBUMIN 4.5  AST 42*  ALT 45  ALKPHOS 202*  BILITOT 0.6   PT/INR Recent Labs    12/03/17 1701  LABPROT 13.6  INR 1.05    STUDIES: No results found.    Impression / Plan:   Joanna Hall is a 52 y.o. y/o female with H/p Leidin V mutation , acute uterine infarct, S/p ileo-rt hemi colectomy for adhesions with chronic diarrhea ,  seen by Dr Marius Ditch recently and commenced on cholestyramine for bile salt diarrhea which has helped. She has been admitted with acute onset of severe abdominal pain of 1 day duration and evidence of a new uterine infarct on CT abdomen. Presently on anticoagulation. She had been on long term NSAID's but has stopped. I have been consulted for possible EGD+colonoscopy .   My impression is that the acute abdominal  Pain is likely secondary to the uterine infarct which is being treated and she is responding. At this time the MRI shows no acute abdominal findings. There is no urgent need for EGD+colonoscopy, in addition for these procedures her anticoagulation would need to be held for the procedures prior to any endoscopy,no diarrhea at this time  Thank you for involving me in the care of this patient.      LOS: 3 days   Joanna Bellows, MD  12/06/2017, 8:23 AM

## 2017-12-06 NOTE — Progress Notes (Signed)
[place] at [place] NAME: Joanna Hall    MR#:  [id number]  DATE OF BIRTH:  [birth year]  SUBJECTIVE:  CHIEF COMPLAINT:   Chief Complaint  Patient presents with  . Abdominal Pain  . Emesis  Patient feeling better, persistent abdominal pain 4 out of 10  REVIEW OF SYSTEMS:  CONSTITUTIONAL: No fever, fatigue or weakness.  EYES: No blurred or double vision.  EARS, NOSE, AND THROAT: No tinnitus or ear pain.  RESPIRATORY: No cough, shortness of breath, wheezing or hemoptysis.  CARDIOVASCULAR: No chest pain, orthopnea, edema.  GASTROINTESTINAL: No nausea, vomiting, diarrhea or abdominal pain.  GENITOURINARY: No dysuria, hematuria.  ENDOCRINE: No polyuria, nocturia,  HEMATOLOGY: No anemia, easy bruising or bleeding SKIN: No rash or lesion. MUSCULOSKELETAL: No joint pain or arthritis.   NEUROLOGIC: No tingling, numbness, weakness.  PSYCHIATRY: No anxiety or depression.   ROS  DRUG ALLERGIES:   Allergies  Allergen Reactions  . Ketamine Other (See Comments) and Anxiety    Unknown but given during previous cancer treatment and passed out after getting med  Other reaction(s): Confusion    VITALS:  Blood pressure (!) 145/83, pulse (!) 57, temperature (!) 97.5 F (36.4 C), temperature source Oral, resp. rate 16, height 5' 9"  (1.753 m), weight 73.8 kg (162 lb 11.2 oz), SpO2 100 %.  PHYSICAL EXAMINATION:  GENERAL:  52 y.o.-year-old patient lying in the bed with no acute distress.  EYES: Pupils equal, round, reactive to light and accommodation. No scleral icterus. Extraocular muscles intact.  HEENT: Head atraumatic, normocephalic. Oropharynx and nasopharynx clear.  NECK:  Supple, no jugular venous distention. No thyroid enlargement, no tenderness.  LUNGS: Normal breath sounds bilaterally, no wheezing, rales,rhonchi or crepitation. No use of accessory muscles of respiration.  CARDIOVASCULAR: S1, S2 normal. No murmurs, rubs, or gallops.   ABDOMEN: Soft, nontender, nondistended. Bowel sounds present. No organomegaly or mass.  EXTREMITIES: No pedal edema, cyanosis, or clubbing.  NEUROLOGIC: Cranial nerves II through XII are intact. Muscle strength 5/5 in all extremities. Sensation intact. Gait not checked.  PSYCHIATRIC: The patient is alert and oriented x 3.  SKIN: No obvious rash, lesion, or ulcer.   Physical Exam LABORATORY PANEL:   CBC Recent Labs  Lab 12/06/17 0411  WBC 2.4*  HGB 11.6*  HCT 33.2*  PLT 144*   ------------------------------------------------------------------------------------------------------------------  Chemistries  Recent Labs  Lab 12/03/17 0941 12/04/17 0455  NA 134* 135  K 3.8 3.9  CL 102 106  CO2 22 22  GLUCOSE 103* 83  BUN 13 6  CREATININE 0.65 0.53  CALCIUM 9.8 8.9  AST 42*  --   ALT 45  --   ALKPHOS 202*  --   BILITOT 0.6  --    ------------------------------------------------------------------------------------------------------------------  Cardiac Enzymes No results for input(s): TROPONINI in the last 168 hours. ------------------------------------------------------------------------------------------------------------------  RADIOLOGY:  No results found.  ASSESSMENT AND PLAN:  52 year old female patient with multiple medical problems of chronic hep C, history of cervical cancer with chemo, radiation therapy, history of small bowel obstruction status post ex lap with hemicolectomy, factor V Leiden deficiency, chronic diarrhea comes in today with severe abdominal pain and emesis.  1.   Acute abdominal/pelvic pain Improved, currently 4 out of 4 Was thought to be due to acute uterine infarct-but this has been ruled out, MRI of the abdomen negative, continue pain control measures, currently on heparin drip, status post oncology/gynecology evaluation-recommended gastroenterology consultation with plans for upper and lower endoscopy for further evaluation,  most likely  will be able to transition back to Xarelto after GI evaluation, oncology following regarding factor V Leiden deficiency, IV fluids for rehydration, antiemetics PRN, advance diet as tolerated   2. Pulmonary nodules Followed by [facility] closely  3.  Acute on chronic diarrhea Stable Continue cholestyramine and Imodium as needed  Gastroenterology to see Colonoscopy 1 year ago per patient noted for polyps only   All the records are reviewed and case discussed with Care Management/Social Workerr. Management plans discussed with the patient, family and they are in agreement.  CODE STATUS: full  TOTAL TIME TAKING CARE OF THIS PATIENT: 35 minutes.     POSSIBLE D/C IN 1-3 DAYS, DEPENDING ON CLINICAL CONDITION.   Avel Peace Daquavion Catala M.D on 12/06/2017   Between 7am to 6pm - Pager - [phone]  After 6pm go to [email or web address] - password [email or web address] [facility]  Office  [phone]  CC: Primary care physician; No primary care provider on file.  Note: This dictation was prepared with Dragon dictation along with smaller phrase technology. Any transcriptional errors that result from this process are unintentional.

## 2017-12-07 LAB — CBC
HEMATOCRIT: 34.1 % — AB (ref 35.0–47.0)
HEMOGLOBIN: 11.7 g/dL — AB (ref 12.0–16.0)
MCH: 34.7 pg — AB (ref 26.0–34.0)
MCHC: 34.3 g/dL (ref 32.0–36.0)
MCV: 101.1 fL — AB (ref 80.0–100.0)
Platelets: 151 10*3/uL (ref 150–440)
RBC: 3.37 MIL/uL — AB (ref 3.80–5.20)
RDW: 14.3 % (ref 11.5–14.5)
WBC: 2.7 10*3/uL — ABNORMAL LOW (ref 3.6–11.0)

## 2017-12-07 LAB — HEPARIN LEVEL (UNFRACTIONATED): Heparin Unfractionated: 0.4 IU/mL (ref 0.30–0.70)

## 2017-12-07 NOTE — Progress Notes (Signed)
ANTICOAGULATION CONSULT NOTE - Initial Consult  Pharmacy Consult for heparin Indication: uterine infarct/factor V Leiden deficiency  Allergies  Allergen Reactions  . Ketamine Other (See Comments) and Anxiety    Unknown but given during previous cancer treatment and passed out after getting med  Other reaction(s): Confusion    Patient Measurements: Height: 5' 9"  (175.3 cm) Weight: 161 lb 6.4 oz (73.2 kg) IBW/kg (Calculated) : 66.2 Heparin Dosing Weight: 71 kg   Vital Signs: Temp: 97.7 F (36.5 C) (02/22 0409) Temp Source: Oral (02/22 0409) BP: 141/77 (02/22 0409) Pulse Rate: 56 (02/22 0409)  Labs: Recent Labs    12/04/17 1534  12/05/17 0648 12/05/17 1319 12/06/17 0411 12/07/17 0701  HGB  --    < > 11.7*  --  11.6* 11.7*  HCT  --   --  33.7*  --  33.2* 34.1*  PLT  --   --  142*  --  144* 151  APTT 84*  --  120* 100*  --   --   HEPARINUNFRC  --    < > 0.53 0.54 0.41 0.40   < > = values in this interval not displayed.    Estimated Creatinine Clearance: 87.9 mL/min (by C-G formula based on SCr of 0.53 mg/dL).   Medical History: Past Medical History:  Diagnosis Date  . Anxiety   . Arthritis   . 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 Cancer ([place])    cervix  . Hepatitis C   . Hypertension   . 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 sodium chloride 75 mL/hr at 12/07/17 0529  . heparin 1,250 Units/hr (12/07/17 0307)    Assessment: 51 yof cc abdominal pain and emesis with PMH HCV, HTN, cervical cancer sp chemo/radiation followed by [facility] oncology. Had SBO last year sp ex lap with hemicolectomy by Dr. Hampton Abbot - noted factor V Leiden deficiency and started Xarelto. Pharmacy consulted to dose heparin for Factor V Leiden deficiency and uterine infarct. Note, patient does take rivaroxaban at home and last dose was the day prior to admission.  Goal of Therapy:  Heparin level 0.3-0.7 units/ml aPTT 68 to 109  seconds Monitor platelets by anticoagulation protocol: Yes   Plan:  HL = 0.4 is therapeutic. Continue heparin drip at current rate of 1250 units/hr and recheck HL and CBC with AM labs tomorrow.  CBC stable  Lenis Noon, PharmD, BCPS Clinical Pharmacist 12/07/17 8:23 AM

## 2017-12-07 NOTE — Progress Notes (Signed)
[place] at [place] NAME: Lacreshia Bondarenko    MR#:  [id number]  DATE OF BIRTH:  January 07, 1966  SUBJECTIVE:  CHIEF COMPLAINT:   Chief Complaint  Patient presents with  . Abdominal Pain  . Emesis  Patient complaining of some vaginal bleeding only, oncology input appreciated, gastroenterology to see-plans for possible endoscopy later today  REVIEW OF SYSTEMS:  CONSTITUTIONAL: No fever, fatigue or weakness.  EYES: No blurred or double vision.  EARS, NOSE, AND THROAT: No tinnitus or ear pain.  RESPIRATORY: No cough, shortness of breath, wheezing or hemoptysis.  CARDIOVASCULAR: No chest pain, orthopnea, edema.  GASTROINTESTINAL: No nausea, vomiting, diarrhea or abdominal pain.  GENITOURINARY: No dysuria, hematuria.  ENDOCRINE: No polyuria, nocturia,  HEMATOLOGY: No anemia, easy bruising or bleeding SKIN: No rash or lesion. MUSCULOSKELETAL: No joint pain or arthritis.   NEUROLOGIC: No tingling, numbness, weakness.  PSYCHIATRY: No anxiety or depression.   ROS  DRUG ALLERGIES:   Allergies  Allergen Reactions  . Ketamine Other (See Comments) and Anxiety    Unknown but given during previous cancer treatment and passed out after getting med  Other reaction(s): Confusion    VITALS:  Blood pressure (!) 147/80, pulse 69, temperature 98.1 F (36.7 C), temperature source Oral, resp. rate 16, height 5' 9"  (1.753 m), weight 73.2 kg (161 lb 6.4 oz), SpO2 99 %.  PHYSICAL EXAMINATION:  GENERAL:  52 y.o.-year-old patient lying in the bed with no acute distress.  EYES: Pupils equal, round, reactive to light and accommodation. No scleral icterus. Extraocular muscles intact.  HEENT: Head atraumatic, normocephalic. Oropharynx and nasopharynx clear.  NECK:  Supple, no jugular venous distention. No thyroid enlargement, no tenderness.  LUNGS: Normal breath sounds bilaterally, no wheezing, rales,rhonchi or crepitation. No use of accessory muscles of  respiration.  CARDIOVASCULAR: S1, S2 normal. No murmurs, rubs, or gallops.  ABDOMEN: Soft, nontender, nondistended. Bowel sounds present. No organomegaly or mass.  EXTREMITIES: No pedal edema, cyanosis, or clubbing.  NEUROLOGIC: Cranial nerves II through XII are intact. Muscle strength 5/5 in all extremities. Sensation intact. Gait not checked.  PSYCHIATRIC: The patient is alert and oriented x 3.  SKIN: No obvious rash, lesion, or ulcer.   Physical Exam LABORATORY PANEL:   CBC Recent Labs  Lab 12/07/17 0701  WBC 2.7*  HGB 11.7*  HCT 34.1*  PLT 151   ------------------------------------------------------------------------------------------------------------------  Chemistries  Recent Labs  Lab 12/03/17 0941 12/04/17 0455  NA 134* 135  K 3.8 3.9  CL 102 106  CO2 22 22  GLUCOSE 103* 83  BUN 13 6  CREATININE 0.65 0.53  CALCIUM 9.8 8.9  AST 42*  --   ALT 45  --   ALKPHOS 202*  --   BILITOT 0.6  --    ------------------------------------------------------------------------------------------------------------------  Cardiac Enzymes No results for input(s): TROPONINI in the last 168 hours. ------------------------------------------------------------------------------------------------------------------  RADIOLOGY:  No results found.  ASSESSMENT AND PLAN:  52 year old female patient with multiple medical problems of chronic hep C, history of cervical cancer with chemo, radiation therapy, history of small bowel obstruction status post ex lap with hemicolectomy, factor V Leiden deficiency, chronic diarrhea comes in today with severe abdominal pain and emesis.  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 Pulmonary nodules Followed by [facility] closely  3.Acute on chronic diarrhea Stable Continuecholestyramineand Imodium as needed Await gastroenterology recommendations-possible upper and lower endoscopy for further evaluation Colonoscopy 1 year ago per patient noted for polyps only  All the records are reviewed and case discussed with Care Management/Social Workerr. Management plans discussed with the patient, family and they are in agreement.  CODE STATUS: full  TOTAL TIME TAKING CARE OF THIS PATIENT: 35 minutes.   POSSIBLE D/C IN 1-3 DAYS, DEPENDING ON CLINICAL CONDITION.   Avel Peace Maliha Outten M.D on 12/07/2017   Between 7am to 6pm - Pager - [phone]  After 6pm go to [email or web address] - password [email or web address] [facility]  Office  [phone]  CC: Primary care physician; No primary care provider on file.  Note: This dictation was prepared with Dragon dictation along with smaller phrase technology. Any transcriptional errors that result from this process are unintentional.

## 2017-12-07 NOTE — Plan of Care (Signed)
  Education: Knowledge of General Education information will improve 12/07/2017 2022 - Progressing by Herbie Baltimore, RN   Health Behavior/Discharge Planning: Ability to manage health-related needs will improve 12/07/2017 2022 - Progressing by Herbie Baltimore, RN   Clinical Measurements: Ability to maintain clinical measurements within normal limits will improve 12/07/2017 2022 - Progressing by Herbie Baltimore, RN Will remain free from infection 12/07/2017 2022 - Progressing by Herbie Baltimore, RN Diagnostic test results will improve 12/07/2017 2022 - Progressing by Herbie Baltimore, RN Respiratory complications will improve 12/07/2017 2022 - Progressing by Herbie Baltimore, RN Cardiovascular complication will be avoided 12/07/2017 2022 - Progressing by Herbie Baltimore, RN   Activity: Risk for activity intolerance will decrease 12/07/2017 2022 - Progressing by Herbie Baltimore, RN   Nutrition: Adequate nutrition will be maintained 12/07/2017 2022 - Progressing by Herbie Baltimore, RN   Coping: Level of anxiety will decrease 12/07/2017 2022 - Progressing by Herbie Baltimore, RN   Elimination: Will not experience complications related to bowel motility 12/07/2017 2022 - Progressing by Herbie Baltimore, RN Will not experience complications related to urinary retention 12/07/2017 2022 - Progressing by Herbie Baltimore, RN   Pain Managment: General experience of comfort will improve 12/07/2017 2022 - Progressing by Herbie Baltimore, RN   Safety: Ability to remain free from injury will improve 12/07/2017 2022 - Progressing by Herbie Baltimore, RN   Skin Integrity: Risk for impaired skin integrity will decrease 12/07/2017 2022 - Progressing by Herbie Baltimore, RN

## 2017-12-08 LAB — CBC
HEMATOCRIT: 34.6 % — AB (ref 35.0–47.0)
Hemoglobin: 12.1 g/dL (ref 12.0–16.0)
MCH: 35 pg — ABNORMAL HIGH (ref 26.0–34.0)
MCHC: 34.9 g/dL (ref 32.0–36.0)
MCV: 100.2 fL — AB (ref 80.0–100.0)
Platelets: 137 10*3/uL — ABNORMAL LOW (ref 150–440)
RBC: 3.45 MIL/uL — ABNORMAL LOW (ref 3.80–5.20)
RDW: 14.6 % — AB (ref 11.5–14.5)
WBC: 2.6 10*3/uL — AB (ref 3.6–11.0)

## 2017-12-08 LAB — HEPARIN LEVEL (UNFRACTIONATED): Heparin Unfractionated: 0.3 IU/mL (ref 0.30–0.70)

## 2017-12-08 MED ORDER — HYDROCODONE-ACETAMINOPHEN 5-325 MG PO TABS: 1.0000 | ORAL_TABLET | Freq: Four times a day (QID) | ORAL | 0 refills | Status: DC | PRN

## 2017-12-08 NOTE — Plan of Care (Signed)
  Progressing Education: Knowledge of General Education information will improve 12/08/2017 0522 - Progressing by Scharlene Gloss, RN Health Behavior/Discharge Planning: Ability to manage health-related needs will improve 12/08/2017 0522 - Progressing by Scharlene Gloss, RN Clinical Measurements: Ability to maintain clinical measurements within normal limits will improve 12/08/2017 0522 - Progressing by Scharlene Gloss, RN Will remain free from infection 12/08/2017 0522 - Progressing by Scharlene Gloss, RN Diagnostic test results will improve 12/08/2017 0522 - Progressing by Scharlene Gloss, RN Respiratory complications will improve 12/08/2017 0522 - Progressing by Scharlene Gloss, RN Cardiovascular complication will be avoided 12/08/2017 0522 - Progressing by Scharlene Gloss, RN Activity: Risk for activity intolerance will decrease 12/08/2017 0522 - Progressing by Scharlene Gloss, RN Nutrition: Adequate nutrition will be maintained 12/08/2017 0522 - Progressing by Scharlene Gloss, RN Coping: Level of anxiety will decrease 12/08/2017 0522 - Progressing by Scharlene Gloss, RN Elimination: Will not experience complications related to bowel motility 12/08/2017 0522 - Progressing by Scharlene Gloss, RN Will not experience complications related to urinary retention 12/08/2017 0522 - Progressing by Scharlene Gloss, RN Pain Managment: General experience of comfort will improve 12/08/2017 0522 - Progressing by Scharlene Gloss, RN Safety: Ability to remain free from injury will improve 12/08/2017 0522 - Progressing by Scharlene Gloss, RN Skin Integrity: Risk for impaired skin integrity will decrease 12/08/2017 0522 - Progressing by Scharlene Gloss, RN

## 2017-12-08 NOTE — Progress Notes (Signed)
ANTICOAGULATION CONSULT NOTE - Initial Consult  Pharmacy Consult for heparin Indication: uterine infarct/factor V Leiden deficiency  Allergies  Allergen Reactions  . Ketamine Other (See Comments) and Anxiety    Unknown but given during previous cancer treatment and passed out after getting med  Other reaction(s): Confusion    Patient Measurements: Height: 5' 9"  (175.3 cm) Weight: 162 lb 11.2 oz (73.8 kg) IBW/kg (Calculated) : 66.2 Heparin Dosing Weight: 71 kg   Vital Signs: Temp: 97.7 F (36.5 C) (02/23 0519) Temp Source: Oral (02/23 0519) BP: 121/75 (02/23 0519) Pulse Rate: 59 (02/23 0519)  Labs: Recent Labs    12/05/17 1319  12/06/17 0411 12/07/17 0701 12/08/17 0621  HGB  --    < > 11.6* 11.7* 12.1  HCT  --   --  33.2* 34.1* 34.6*  PLT  --   --  144* 151 137*  APTT 100*  --   --   --   --   HEPARINUNFRC 0.54  --  0.41 0.40 0.30   < > = values in this interval not displayed.    Estimated Creatinine Clearance: 87.9 mL/min (by C-G formula based on SCr of 0.53 mg/dL).   Medical History: Past Medical History:  Diagnosis Date  . Anxiety   . Arthritis   . 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 Cancer ([place])    cervix  . Hepatitis C   . Hypertension   . 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 sodium chloride 75 mL/hr at 12/08/17 0436  . heparin 1,250 Units/hr (12/08/17 0002)    Assessment: 51 yof cc abdominal pain and emesis with PMH HCV, HTN, cervical cancer sp chemo/radiation followed by [facility] oncology. Had SBO last year sp ex lap with hemicolectomy by Dr. Hampton Abbot - noted factor V Leiden deficiency and started Xarelto. Pharmacy consulted to dose heparin for Factor V Leiden deficiency and uterine infarct. Note, patient does take rivaroxaban at home and last dose was the day prior to admission.  Goal of Therapy:  Heparin level 0.3-0.7 units/ml aPTT 68 to 109 seconds Monitor platelets by anticoagulation  protocol: Yes   Plan:  HL = 0.3 is therapeutic. Continue heparin drip at current rate of 1250 units/hr and recheck HL and CBC with AM labs tomorrow.  CBC stable  Ramond Dial, PharmD, BCPS Clinical Pharmacist 12/08/17 7:32 AM

## 2017-12-08 NOTE — Discharge Instructions (Signed)
Resume diet and activity as before  Follow up with Dr. Marius Ditch as previously scheduled

## 2017-12-08 NOTE — Progress Notes (Signed)
Patient discharged with father. IV removed, patient tolerated well. Verbalized understanding of education. Patient with no complaints.

## 2017-12-11 ENCOUNTER — Encounter: Payer: Self-pay | Admitting: Hematology and Oncology

## 2017-12-11 ENCOUNTER — Inpatient Hospital Stay: Payer: Medicaid Other | Attending: Hematology and Oncology | Admitting: Hematology and Oncology

## 2017-12-11 VITALS — BP 120/86 | HR 96 | Temp 97.0°F | Resp 18 | Wt 155.4 lb

## 2017-12-11 DIAGNOSIS — Z87891 Personal history of nicotine dependence: Secondary | ICD-10-CM | POA: Insufficient documentation

## 2017-12-11 DIAGNOSIS — B1920 Unspecified viral hepatitis C without hepatic coma: Secondary | ICD-10-CM | POA: Diagnosis not present

## 2017-12-11 DIAGNOSIS — N179 Acute kidney failure, unspecified: Secondary | ICD-10-CM | POA: Insufficient documentation

## 2017-12-11 DIAGNOSIS — Z9221 Personal history of antineoplastic chemotherapy: Secondary | ICD-10-CM | POA: Insufficient documentation

## 2017-12-11 DIAGNOSIS — E538 Deficiency of other specified B group vitamins: Secondary | ICD-10-CM

## 2017-12-11 DIAGNOSIS — Z7901 Long term (current) use of anticoagulants: Secondary | ICD-10-CM | POA: Diagnosis not present

## 2017-12-11 DIAGNOSIS — C538 Malignant neoplasm of overlapping sites of cervix uteri: Secondary | ICD-10-CM

## 2017-12-11 DIAGNOSIS — F419 Anxiety disorder, unspecified: Secondary | ICD-10-CM | POA: Insufficient documentation

## 2017-12-11 DIAGNOSIS — Z923 Personal history of irradiation: Secondary | ICD-10-CM | POA: Diagnosis not present

## 2017-12-11 DIAGNOSIS — C530 Malignant neoplasm of endocervix: Secondary | ICD-10-CM

## 2017-12-11 DIAGNOSIS — K56609 Unspecified intestinal obstruction, unspecified as to partial versus complete obstruction: Secondary | ICD-10-CM | POA: Diagnosis not present

## 2017-12-11 DIAGNOSIS — B182 Chronic viral hepatitis C: Secondary | ICD-10-CM

## 2017-12-11 DIAGNOSIS — Z79899 Other long term (current) drug therapy: Secondary | ICD-10-CM | POA: Diagnosis not present

## 2017-12-11 DIAGNOSIS — D6851 Activated protein C resistance: Secondary | ICD-10-CM | POA: Insufficient documentation

## 2017-12-11 DIAGNOSIS — I10 Essential (primary) hypertension: Secondary | ICD-10-CM | POA: Insufficient documentation

## 2017-12-11 DIAGNOSIS — R918 Other nonspecific abnormal finding of lung field: Secondary | ICD-10-CM | POA: Diagnosis not present

## 2017-12-11 MED ORDER — RIVAROXABAN 20 MG PO TABS: 20.0000 mg | ORAL_TABLET | Freq: Every day | ORAL | 1 refills | Status: DC

## 2017-12-11 NOTE — Progress Notes (Signed)
Patient states she got out of the hospital Sunday after a 5 day stay for left pelvic pain.  She is feeling much better today with pain level 3/10 on that left side.  Patient states she had her Xarelto filled in [facility] because that is where her medicaid was.  She did not realize until recently that her prescription was for 15 mg tablets instead of the 20 mg that is reflected on her medication list.

## 2017-12-11 NOTE — Discharge Summary (Signed)
[place] [facility] [place] NAME: Joanna Hall    MR#:  [id number]  DATE OF BIRTH:  09-19-1966  DATE OF ADMISSION:  12/03/2017 ADMITTING PHYSICIAN: Epifanio Lesches, MD  DATE OF DISCHARGE: 12/08/2017 12:56 PM  PRIMARY CARE PHYSICIAN: Patient, No Pcp Per   ADMISSION DIAGNOSIS:  Lower abdominal pain [R10.30]  DISCHARGE DIAGNOSIS:  Active Problems:   Abdominal pain   Malnutrition of moderate degree   SECONDARY DIAGNOSIS:   Past Medical History:  Diagnosis Date  . Anxiety   . Arthritis   . 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 Cancer ([place])    cervix  . Hepatitis C   . Hypertension   . Infarction of kidney ([place]) left kidney     ADMITTING HISTORY  HISTORY OF PRESENT ILLNESS:  Joanna Hall  is a 52 y.o. female with a known history of hep C, hypertension, history of cervical cancer status post chemo, radiation therapy is followed by [facility] oncology closely had a small bowel obstruction last year underwent ex lap with hemicolectomy by Dr. Hampton Abbot after that patient had factor V Leiden deficiency and started on Xarelto.  Patient seen by Dr. longer because of continuous chronic diarrhea.  But she came today because of severe left lower quadrant abdominal pain that is radiating to the back associated with nausea.  She states pain 10 out of 10 in severity.  Abdominal pain mainly in the left lower quadrant.  CT of abdomen showed worsening uterine infarct.  Patient does have a history of uterine infarct, factor V Leiden deficiency for which she takes Xarelto.  But today CT abdomen showed worsening uterine infarct.  She received multiple doses of morphine.  Rest of the lab work essentially normal but she is waiting for MRI of the abdomen.  No shortness of breath or cough or fever.  Does have chronic diarrhea, started on cholestyramine recently by Dr. 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  Initially acute uterine infarct was suspected on ultrasound.  An MRI was checked which was negative.  Patient was seen by gynecology oncology.  Followed by GI.  Slowly her pain improved.  This is well controlled with pain medications at this time.  She will follow-up with her oncologist as outpatient.  *Pulmonary nodules.  Followed by [facility] oncology.  *Acute on chronic diarrhea.  Started on cholestyramine and Imodium as per GI recommendations.  Improved.  Normal formed stools on day of discharge.  Patient discharged home in stable condition to follow-up with GI and gynecology oncology.  CONSULTS OBTAINED:  Treatment Team:  Lequita Asal, MD Cammie Sickle, MD Mellody Drown, MD Jonathon Bellows, MD  DRUG ALLERGIES:   Allergies  Allergen Reactions  . Ketamine Other (See Comments) and Anxiety    Unknown but given during previous cancer treatment and passed out after getting med  Other reaction(s): Confusion    DISCHARGE MEDICATIONS:   Allergies as of 12/08/2017      Reactions   Ketamine Other (See Comments), Anxiety   Unknown but given during previous cancer treatment and passed out after getting med  Other reaction(s): Confusion      Medication List    STOP taking these medications   Rivaroxaban 15 MG Tabs tablet Commonly known as:  XARELTO     TAKE these medications   acetaminophen 325 MG tablet Commonly known  as:  TYLENOL Take 650 mg every 6 (six) hours as needed by mouth.   cholestyramine 4 g packet Commonly known as:  QUESTRAN Take 1 packet (4 g total) by mouth 3 (three) times daily with meals.   cyanocobalamin 1000 MCG/ML injection Commonly known as:  (VITAMIN B-12) Inject 1ML every week x 4 weeks, then 1ML every other week x 2 months, then monthly x 3 months   dicyclomine 10 MG capsule Commonly known as:  BENTYL Take 1 capsule (10 mg total) by mouth 4 (four) times daily -  before meals and at  bedtime.   diphenoxylate-atropine 2.5-0.025 MG tablet Commonly known as:  LOMOTIL Take 2 tablets by mouth 4 (four) times daily as needed for diarrhea or loose stools. Titrate down after 48 hours   HYDROcodone-acetaminophen 5-325 MG tablet Commonly known as:  NORCO/VICODIN Take 1 tablet by mouth every 6 (six) hours as needed for severe pain.   hydrocortisone 2.5 % rectal cream Commonly known as:  ANUSOL-HC Place 1 application rectally 2 (two) times daily.   IMODIUM A-D 2 MG tablet Generic drug:  loperamide Take 2 mg by mouth 4 (four) times daily as needed for diarrhea or loose stools.   multivitamin with minerals Tabs tablet Take 1 tablet daily by mouth.   sertraline 50 MG tablet Commonly known as:  ZOLOFT Take 50 mg daily by mouth.   SYRINGE 3CC/22GX1" 22G X 1" 3 ML Misc For use with Vitamin B12 injections   VENTOLIN HFA 108 (90 Base) MCG/ACT inhaler Generic drug:  albuterol INHALE 1-2 PUFFS BY MOUTH EVERY 4 HOURS AS NEEDED FOR WHEEZING / SHORTNESS OF BREATH   Vitamin D (Ergocalciferol) 50000 units Caps capsule Commonly known as:  DRISDOL Take 1 capsule (50,000 Units total) by mouth every 7 (seven) days for 12 doses.       Today   VITAL SIGNS:  Blood pressure 132/77, pulse 75, temperature 98.6 F (37 C), temperature source Oral, resp. rate 16, height 5' 9"  (1.753 m), weight 73.8 kg (162 lb 11.2 oz), SpO2 99 %.  I/O:  No intake or output data in the 24 hours ending 12/11/17 1653  PHYSICAL EXAMINATION:  Physical Exam  GENERAL:  52 y.o.-year-old patient lying in the bed with no acute distress.  LUNGS: Normal breath sounds bilaterally, no wheezing, rales,rhonchi or crepitation. No use of accessory muscles of respiration.  CARDIOVASCULAR: S1, S2 normal. No murmurs, rubs, or gallops.  ABDOMEN: Soft, non-tender, non-distended. Bowel sounds present. No organomegaly or mass.  NEUROLOGIC: Moves all 4 extremities. PSYCHIATRIC: The patient is alert and oriented x 3.   SKIN: No obvious rash, lesion, or ulcer.   DATA REVIEW:   CBC Recent Labs  Lab 12/08/17 0621  WBC 2.6*  HGB 12.1  HCT 34.6*  PLT 137*    Chemistries  No results for input(s): NA, K, CL, CO2, GLUCOSE, BUN, CREATININE, CALCIUM, MG, AST, ALT, ALKPHOS, BILITOT in the last 168 hours.  Invalid input(s): GFRCGP  Cardiac Enzymes No results for input(s): TROPONINI in the last 168 hours.  Microbiology Results  Results for orders placed or performed in visit on 11/28/17  Calprotectin, Fecal     Status: None   Collection Time: 11/28/17  7:30 AM  Result Value Ref Range Status   Calprotectin, Fecal <16 0 - 120 ug/g Final    Comment: Concentration     Interpretation   Follow-Up <16 - 50 ug/g     Normal  None >50 -120 ug/g     Borderline       Re-evaluate in 4-6 weeks     >120 ug/g     Abnormal         Repeat as clinically                                    indicated     RADIOLOGY:  No results found.  Follow up with PCP in 1 week.  Management plans discussed with the patient, family and they are in agreement.  CODE STATUS:  Code Status History    Date Active Date Inactive Code Status Order ID Comments User Context   12/03/2017 16:19 12/08/2017 16:06 Full Code [id number]  Epifanio Lesches, MD ED   09/12/2017 00:24 09/15/2017 17:21 Full Code [id number]  [name], MD ED   08/27/2017 02:23 09/05/2017 13:55 Full Code [id number]  Herbert Pun, MD Inpatient      TOTAL TIME TAKING CARE OF THIS PATIENT ON DAY OF DISCHARGE: more than 30 minutes.   Joanna Hall M.D on 12/11/2017 at 4:53 PM  Between 7am to 6pm - Pager - [phone]  After 6pm go to [email or web address] - password [email or web address] [facility]  Office  [phone]  CC: Primary care physician; Patient, No Pcp Per  Note: This dictation was prepared with Dragon dictation along with smaller phrase technology. Any transcriptional errors that result from this process are unintentional.

## 2017-12-11 NOTE — Progress Notes (Addendum)
[facility] day:  12/11/2017  Chief Complaint: Joanna Hall is a 52 y.o. female with a history of cervical cancer s/p chemoradiation who is seen for follow-up after recent hospitalization.  HPI:  The patient was last seen in the medical oncology clinic on 10/19/2017.  At that time, she had diarrhea since right ileocolectomy.  Midline incision was healing. She had gained weight. Exam revealed a small pink area with central draining of a small amount of serosangenous fluid.  We discussed follow-up after repeat imaging in [place].  PET scan on 11/08/2017 at the [place] [facility] revealed innumerable stable bilateral cavitary pulmonary nodules (up to 8 mm), some of which demonstrate low level metabolic activity.  There were post treatment changes in the pelvis without evidence of suspicious hypermetabolic activity.  She saw Dr. Marius Ditch on 11/27/2017 secondary to ongoing diarrhea.  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  She started cholestyramine 4 g 2-3 times daily with meals as well as dicyclomine for abdominal cramps  She was admitted to [facility] from 12/03/2017 - 12/08/2017 with abdominal pain.  LLQ pain began on Sunday, 12/02/2017.   Pain extended across her lower abdomen then around her side and into her back.  She denied any change in bowel movements (chronic diarrhea).  She denied any melena or hematochezia.  She has had some dysuria for a few days and a sense of urgency.  Urinalysis was negative.  She did not have a fever.  She was on Xarelto (no doses missed).  Abdomen and pelvic CT on 12/03/2017 revealed continued multiple pulmonary nodules in the lung bases concerning for metastatic disease.  She is s/p right hemicolectomy. No abnormal bowel dilatation is noted. Mild wall thickening of rectosigmoid colon is noted.  There was a stable 1.8 x 1.6 cm mixed fat and soft tissue density noted in right lower quadrant  which may represent fat necrosis.  There was a larger low density is noted involving uterine fundus which may represent worsening or progressive uterine infarction.  Pelvic MRI on 12/03/2017 was unremarkable.  There was remote scarring type changes involving the uterus and possible radiation changes. There was no acute abnormality  While hospitalized, she was seen by Dr. Vicente Males.  Differential included possible radiation proctitis.  She was not felt to need an urgent colonoscopy.    She notes that her diarrhea has improved with the use of the cholestyramine. She has known HCV. Patient is scheduled to see Dr. Marius Ditch (GI) in follow up on 12/12/2017. Patient started on B12 injections about a week and a half ago. She is doing her injections at home.   Symptomatically, patient has RIGHT lower back pain. She notes that this pain has been going on for a couple of years. She denies any urinary symptoms. She has not appreciated any gross hematuria. Patient denies bleeding; no hematochezia, melena, or vaginal bleeding.,    Patient eating well. Her weight is down 3 pounds. Patient complains of pain 3/10 in the LLQ today.    Past Medical History:  Diagnosis Date  . Anxiety   . Arthritis   . 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 Cancer ([place])    cervix  . Hepatitis C   . Hypertension   . 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 CHOLECYSTECTOMY    . LAPAROTOMY N/A  08/31/2017   Procedure: EXPLORATORY LAPAROTOMY for SBO, ileocolectomy, removal of piece of uterine wall;  Surgeon: [name], MD;  Location: [facility] ORS;  Service: General;  Laterality: N/A;  . OOPHORECTOMY      History reviewed. No pertinent family history.  Social History:  reports that she quit smoking about 11 years ago. she has never used smokeless tobacco. She reports that she does not drink alcohol or use drugs.  The patient's 33 year old daughter died of  alcoholism.  The patient is originally from [place].  She moved from [place] to [place] 2 weeks ago.  She is alone today.  Allergies:  Allergies  Allergen Reactions  . Ketamine Other (See Comments) and Anxiety    Unknown but given during previous cancer treatment and passed out after getting med  Other reaction(s): Confusion    Current Medications: Current Outpatient Medications  Medication Sig Dispense Refill  . acetaminophen (TYLENOL) 325 MG tablet Take 650 mg every 6 (six) hours as needed by mouth.    . cholestyramine (QUESTRAN) 4 g packet Take 1 packet (4 g total) by mouth 3 (three) times daily with meals. 90 packet 0  . cyanocobalamin (,VITAMIN B-12,) 1000 MCG/ML injection Inject 1ML every week x 4 weeks, then 1ML every other week x 2 months, then monthly x 3 months 10 mL 1  . dicyclomine (BENTYL) 10 MG capsule Take 1 capsule (10 mg total) by mouth 4 (four) times daily -  before meals and at bedtime. 120 capsule 0  . diphenoxylate-atropine (LOMOTIL) 2.5-0.025 MG tablet Take 2 tablets by mouth 4 (four) times daily as needed for diarrhea or loose stools. Titrate down after 48 hours 50 tablet 0  . HYDROcodone-acetaminophen (NORCO/VICODIN) 5-325 MG tablet Take 1 tablet by mouth every 6 (six) hours as needed for severe pain. 15 tablet 0  . hydrocortisone (ANUSOL-HC) 2.5 % rectal cream Place 1 application rectally 2 (two) times daily. 30 g 0  . loperamide (IMODIUM A-D) 2 MG tablet Take 2 mg by mouth 4 (four) times daily as needed for diarrhea or loose stools.    . Multiple Vitamin (MULTIVITAMIN WITH MINERALS) TABS tablet Take 1 tablet daily by mouth.    . rivaroxaban (XARELTO) 20 MG TABS tablet Take 1 tablet (20 mg total) by mouth daily with supper. 30 tablet 0  . sertraline (ZOLOFT) 50 MG tablet Take 50 mg daily by mouth.    . Syringe/Needle, Disp, (SYRINGE 3CC/22GX1") 22G X 1" 3 ML MISC For use with Vitamin B12 injections 50 each 0  . VENTOLIN HFA 108 (90 Base) MCG/ACT  inhaler INHALE 1-2 PUFFS BY MOUTH EVERY 4 HOURS AS NEEDED FOR WHEEZING / SHORTNESS OF BREATH  0  . Vitamin D, Ergocalciferol, (DRISDOL) 50000 units CAPS capsule Take 1 capsule (50,000 Units total) by mouth every 7 (seven) days for 12 doses. 12 capsule 0   No current facility-administered medications for this visit.     Review of Systems:  GENERAL:  Feels "better".  No fevers or sweats. Weight down 3 pounds. PERFORMANCE STATUS (ECOG):  2 HEENT:  No visual changes, runny nose, sore throat, mouth sores or tenderness. Lungs: No shortness of breath or cough.  No hemoptysis. Cardiac:  Known heart murmur.  No chest pain, palpitations, orthopnea, or PND. GI:  Diarrhea, improved.  No vomiting, constipation, melena or hematochezia.  GU:  No urgency, frequency, dysuria, or hematuria. h/o recurrent UTIs. 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  Right lower back pain.  No muscle tenderness. Extremities:  No pain or swelling. Skin:  No rashes or skin changes. Neuro:  No headache, numbness or weakness, balance or coordination issues. Endocrine:  No diabetes, thyroid issues, hot flashes or night sweats. Psych:  No mood changes, depression or anxiety. Pain:  3/10 - left lower quadrant (marked improvement since hospitalization). Review of systems:  All other systems reviewed and found to be negative.  Physical Exam: Blood pressure 120/86, pulse 96, temperature (!) 97 F (36.1 C), temperature source Tympanic, resp. rate 18, weight 155 lb 7 oz (70.5 kg). GENERAL: Well appearing woman sitting comfortably in the medical oncology clinic in no acute distress. MENTAL STATUS:  Alert and oriented to person, place and time. HEAD:  Long brown hair.  Normocephalic, atraumatic, face symmetric, no Cushingoid features. EYES:  Blue eyes.  Pupils equal round and reactive to light and accomodation.  No conjunctivitis or scleral icterus. ENT:  Oropharynx clear without lesion.  Tongue normal.  No upper  teeth.  Mucous membranes moist.  RESPIRATORY:  Decreased breath sounds at the bases.  Clear to auscultation without rales, wheezes or rhonchi. CARDIOVASCULAR:  Regular rate and rhythm without murmur, rub or gallop. ABDOMEN:  Soft, slightly tender in the LLQ extending medially.  No guarding or rebound tenderness.  Active bowel sounds and no hepatosplenomegaly.  No masses. BACK:  No CVA tenderness. SKIN:  No rashes, ulcers or bruises. EXTREMITIES: No edema, no skin discoloration or tenderness.  No palpable cords. LYMPH NODES: No palpable cervical, supraclavicular, axillary or inguinal adenopathy  NEUROLOGICAL: Unremarkable. PSYCH:  Appropriate.   No visits with results within 3 Day(s) from this visit.  Latest known visit with results is:  Admission on 12/03/2017, Discharged on 12/08/2017  Component Date Value Ref Range Status  . Lipase 12/03/2017 34  11 - 51 U/L Final   Performed at [facility], [address] [place], [place] [address]  . Sodium 12/03/2017 134* 135 - 145 mmol/L Final  . Potassium 12/03/2017 3.8  3.5 - 5.1 mmol/L Final  . Chloride 12/03/2017 102  101 - 111 mmol/L Final  . CO2 12/03/2017 22  22 - 32 mmol/L Final  . Glucose, Bld 12/03/2017 103* 65 - 99 mg/dL Final  . BUN 12/03/2017 13  6 - 20 mg/dL Final  . Creatinine, Ser 12/03/2017 0.65  0.44 - 1.00 mg/dL Final  . Calcium 12/03/2017 9.8  8.9 - 10.3 mg/dL Final  . Total Protein 12/03/2017 9.3* 6.5 - 8.1 g/dL Final  . Albumin 12/03/2017 4.5  3.5 - 5.0 g/dL Final  . AST 12/03/2017 42* 15 - 41 U/L Final  . ALT 12/03/2017 45  14 - 54 U/L Final  . Alkaline Phosphatase 12/03/2017 202* 38 - 126 U/L Final  . Total Bilirubin 12/03/2017 0.6  0.3 - 1.2 mg/dL Final  . GFR calc non Af Amer 12/03/2017 >60  >60 mL/min Final  . GFR calc Af Amer 12/03/2017 >60  >60 mL/min Final   Comment: (NOTE) The eGFR has been calculated using the CKD EPI equation. This calculation has not been validated in all clinical  situations. eGFR's persistently <60 mL/min signify possible Chronic Kidney Disease.   Georgiann Hahn gap 12/03/2017 10  5 - 15 Final   Performed at [facility], [address] [place], [place] [address]  . WBC 12/03/2017 4.1  3.6 - 11.0 K/uL Final  . RBC 12/03/2017 4.11  3.80 - 5.20 MIL/uL Final  . Hemoglobin 12/03/2017 14.2  12.0 - 16.0 g/dL Final  .  HCT 12/03/2017 41.3  35.0 - 47.0 % Final  . MCV 12/03/2017 100.3* 80.0 - 100.0 fL Final  . MCH 12/03/2017 34.6* 26.0 - 34.0 pg Final  . MCHC 12/03/2017 34.5  32.0 - 36.0 g/dL Final  . RDW 12/03/2017 14.5  11.5 - 14.5 % Final  . Platelets 12/03/2017 221  150 - 440 K/uL Final   Performed at [facility], [address], [place], [place] [address]  . Color, Urine 12/03/2017 YELLOW* YELLOW Final  . APPearance 12/03/2017 CLOUDY* CLEAR Final  . Specific Gravity, Urine 12/03/2017 1.015  1.005 - 1.030 Final  . pH 12/03/2017 6.0  5.0 - 8.0 Final  . Glucose, UA 12/03/2017 NEGATIVE  NEGATIVE mg/dL Final  . Hgb urine dipstick 12/03/2017 NEGATIVE  NEGATIVE Final  . Bilirubin Urine 12/03/2017 NEGATIVE  NEGATIVE Final  . Ketones, ur 12/03/2017 NEGATIVE  NEGATIVE mg/dL Final  . Protein, ur 12/03/2017 NEGATIVE  NEGATIVE mg/dL Final  . Nitrite 12/03/2017 NEGATIVE  NEGATIVE Final  . Leukocytes, UA 12/03/2017 TRACE* NEGATIVE Final  . RBC / HPF 12/03/2017 0-5  0 - 5 RBC/hpf Final  . WBC, UA 12/03/2017 0-5  0 - 5 WBC/hpf Final  . Bacteria, UA 12/03/2017 NONE SEEN  NONE SEEN Final  . Squamous Epithelial / LPF 12/03/2017 NONE SEEN  NONE SEEN Final  . Hyaline Casts, UA 12/03/2017 PRESENT   Final   Performed at [facility], [address], [place], [place] [address]  . Heparin Unfractionated 12/03/2017 0.30  0.30 - 0.70 IU/mL Final   Comment:        IF HEPARIN RESULTS ARE BELOW EXPECTED VALUES, AND PATIENT DOSAGE HAS BEEN CONFIRMED, SUGGEST FOLLOW UP TESTING OF ANTITHROMBIN III LEVELS. Performed at [facility], [address] [place], [place] [address]   . aPTT 12/03/2017 28  24 - 36 seconds Final   Performed at [facility], [address] [place], [place] [address]  . Prothrombin Time 12/03/2017 13.6  11.4 - 15.2 seconds Final  . INR 12/03/2017 1.05   Final   Performed at [facility], [address] [place], [place] [address]  . Sodium 12/04/2017 135  135 - 145 mmol/L Final  . Potassium 12/04/2017 3.9  3.5 - 5.1 mmol/L Final  . Chloride 12/04/2017 106  101 - 111 mmol/L Final  . CO2 12/04/2017 22  22 - 32 mmol/L Final  . Glucose, Bld 12/04/2017 83  65 - 99 mg/dL Final  . BUN 12/04/2017 6  6 - 20 mg/dL Final  . Creatinine, Ser 12/04/2017 0.53  0.44 - 1.00 mg/dL Final  . Calcium 12/04/2017 8.9  8.9 - 10.3 mg/dL Final  . GFR calc non Af Amer 12/04/2017 >60  >60 mL/min Final  . GFR calc Af Amer 12/04/2017 >60  >60 mL/min Final   Comment: (NOTE) The eGFR has been calculated using the CKD EPI equation. This calculation has not been validated in all clinical situations. eGFR's persistently <60 mL/min signify possible Chronic Kidney Disease.   Georgiann Hahn gap 12/04/2017 7  5 - 15 Final   Performed at [facility], [address] [place], [place] [address]  . WBC 12/04/2017 2.4* 3.6 - 11.0 K/uL Final  . RBC 12/04/2017 3.33* 3.80 - 5.20 MIL/uL Final  . Hemoglobin 12/04/2017 11.7* 12.0 - 16.0 g/dL Final  . HCT 12/04/2017 34.1* 35.0 - 47.0 % Final  . MCV 12/04/2017 102.4* 80.0 - 100.0 fL Final  . MCH 12/04/2017 35.1* 26.0 - 34.0 pg  Final  . MCHC 12/04/2017 34.2  32.0 - 36.0 g/dL Final  . RDW 12/04/2017 15.2* 11.5 - 14.5 % Final  . Platelets 12/04/2017 168  150 - 440 K/uL Final   Performed at [facility], [address] [place], [place] [address]  . aPTT 12/03/2017 59* 24 - 36 seconds Final   Comment:        IF BASELINE aPTT IS ELEVATED, SUGGEST PATIENT RISK ASSESSMENT BE USED TO DETERMINE APPROPRIATE ANTICOAGULANT THERAPY. Performed at [facility], [address], [place], [place] [address]   . Heparin Unfractionated 12/03/2017 0.26* 0.30 - 0.70 IU/mL Final   Comment:        IF HEPARIN RESULTS ARE BELOW EXPECTED VALUES, AND PATIENT DOSAGE HAS BEEN CONFIRMED, SUGGEST FOLLOW UP TESTING OF ANTITHROMBIN III LEVELS. Performed at [facility], [address] [place], [place] [address]   . aPTT 12/04/2017 73* 24 - 36 seconds Final   Comment:        IF BASELINE aPTT IS ELEVATED, SUGGEST PATIENT RISK ASSESSMENT BE USED TO DETERMINE APPROPRIATE ANTICOAGULANT THERAPY. Performed at [facility], [address] [place], [place] [address]   . Glucose-Capillary 12/04/2017 99  65 - 99 mg/dL Final  . aPTT 12/04/2017 84* 24 - 36 seconds Final   Comment:        IF BASELINE aPTT IS ELEVATED, SUGGEST PATIENT RISK ASSESSMENT BE USED TO DETERMINE APPROPRIATE ANTICOAGULANT THERAPY. Performed at [facility], [address], [place], [place] [address]   . WBC 12/05/2017 2.0* 3.6 - 11.0 K/uL Final  . RBC 12/05/2017 3.30* 3.80 - 5.20 MIL/uL Final  . Hemoglobin 12/05/2017 11.7* 12.0 - 16.0 g/dL Final  . HCT 12/05/2017 33.7* 35.0 - 47.0 % Final  . MCV 12/05/2017 102.0* 80.0 - 100.0 fL Final  . MCH 12/05/2017 35.3* 26.0 - 34.0 pg Final  . MCHC 12/05/2017 34.6  32.0 - 36.0 g/dL Final  . RDW 12/05/2017 14.5  11.5 - 14.5 % Final  . Platelets 12/05/2017 142* 150 - 440 K/uL Final   Performed at [facility], [address] [place], [place] [address]  . aPTT 12/05/2017 120* 24 - 36 seconds Final   Comment:        IF BASELINE aPTT IS ELEVATED, SUGGEST PATIENT RISK ASSESSMENT BE USED TO DETERMINE APPROPRIATE ANTICOAGULANT THERAPY. Performed at [facility], [address], [place], [place] [address]   . Heparin Unfractionated 12/05/2017 0.53  0.30 - 0.70 IU/mL Final   Comment:        IF HEPARIN RESULTS ARE BELOW EXPECTED VALUES, AND PATIENT DOSAGE HAS BEEN CONFIRMED, SUGGEST FOLLOW UP  TESTING OF ANTITHROMBIN III LEVELS. Performed at [facility], [address], [place], [place] [address]   . Glucose-Capillary 12/05/2017 83  65 - 99 mg/dL Final  . Heparin Unfractionated 12/05/2017 0.54  0.30 - 0.70 IU/mL Final   Comment:        IF HEPARIN RESULTS ARE BELOW EXPECTED VALUES, AND PATIENT DOSAGE HAS BEEN CONFIRMED, SUGGEST FOLLOW UP TESTING OF ANTITHROMBIN III LEVELS. Performed at [facility], [address] [place], [place] [address]   . aPTT 12/05/2017 100* 24 - 36 seconds Final   Comment:        IF BASELINE aPTT IS ELEVATED, SUGGEST PATIENT RISK ASSESSMENT BE USED TO DETERMINE APPROPRIATE ANTICOAGULANT THERAPY. Performed at [facility], [address], [place], [place] [address]   . WBC 12/06/2017 2.4* 3.6 - 11.0 K/uL Final  .  RBC 12/06/2017 3.29* 3.80 - 5.20 MIL/uL Final  . Hemoglobin 12/06/2017 11.6* 12.0 - 16.0 g/dL Final  . HCT 12/06/2017 33.2* 35.0 - 47.0 % Final  . MCV 12/06/2017 100.8* 80.0 - 100.0 fL Final  . MCH 12/06/2017 35.1* 26.0 - 34.0 pg Final  . MCHC 12/06/2017 34.9  32.0 - 36.0 g/dL Final  . RDW 12/06/2017 14.5  11.5 - 14.5 % Final  . Platelets 12/06/2017 144* 150 - 440 K/uL Final   Performed at [facility], [address] [place], [place] [address]  . Heparin Unfractionated 12/06/2017 0.41  0.30 - 0.70 IU/mL Final   Comment:        IF HEPARIN RESULTS ARE BELOW EXPECTED VALUES, AND PATIENT DOSAGE HAS BEEN CONFIRMED, SUGGEST FOLLOW UP TESTING OF ANTITHROMBIN III LEVELS. Performed at [facility], [address], [place], [place] [address]   . Heparin Unfractionated 12/07/2017 0.40  0.30 - 0.70 IU/mL Final   Comment:        IF HEPARIN RESULTS ARE BELOW EXPECTED VALUES, AND PATIENT DOSAGE HAS BEEN CONFIRMED, SUGGEST FOLLOW UP TESTING OF ANTITHROMBIN III LEVELS. Performed at [facility], [address], [place], [place] [address]   . WBC 12/07/2017 2.7* 3.6 - 11.0 K/uL  Final  . RBC 12/07/2017 3.37* 3.80 - 5.20 MIL/uL Final  . Hemoglobin 12/07/2017 11.7* 12.0 - 16.0 g/dL Final  . HCT 12/07/2017 34.1* 35.0 - 47.0 % Final  . MCV 12/07/2017 101.1* 80.0 - 100.0 fL Final  . MCH 12/07/2017 34.7* 26.0 - 34.0 pg Final  . MCHC 12/07/2017 34.3  32.0 - 36.0 g/dL Final  . RDW 12/07/2017 14.3  11.5 - 14.5 % Final  . Platelets 12/07/2017 151  150 - 440 K/uL Final   Performed at [facility], [address], [place], [place] [address]  . WBC 12/08/2017 2.6* 3.6 - 11.0 K/uL Final  . RBC 12/08/2017 3.45* 3.80 - 5.20 MIL/uL Final  . Hemoglobin 12/08/2017 12.1  12.0 - 16.0 g/dL Final  . HCT 12/08/2017 34.6* 35.0 - 47.0 % Final  . MCV 12/08/2017 100.2* 80.0 - 100.0 fL Final  . MCH 12/08/2017 35.0* 26.0 - 34.0 pg Final  . MCHC 12/08/2017 34.9  32.0 - 36.0 g/dL Final  . RDW 12/08/2017 14.6* 11.5 - 14.5 % Final  . Platelets 12/08/2017 137* 150 - 440 K/uL Final   Performed at [facility], [address] [place], [place] [address]  . Heparin Unfractionated 12/08/2017 0.30  0.30 - 0.70 IU/mL Final   Comment:        IF HEPARIN RESULTS ARE BELOW EXPECTED VALUES, AND PATIENT DOSAGE HAS BEEN CONFIRMED, SUGGEST FOLLOW UP TESTING OF ANTITHROMBIN III LEVELS. Performed at [facility], [address] [place], [place] [address]     Assessment:  Joanna Hall is a 52 y.o. female with a history of stage IB1 cervical cancer s/p concurrent cisplatin and radiation (external beam and brachytherapy) from 11/2016 - 02/2017.    She was treated by Dr Christene Slates at [facility] in [place].  In 01/2017, she received cisplatin x 2 and carboplatin x 1 (01/29/2017) due to ARF and XRT. Radiation was followed by T & O on 02/01/2017 and T & N on 02/10/2017 and 02/20/2017.  Course was complicated by weight loss (80 pounds), nausea, vomiting, electrolyte wasting (potassium and magnesium), and multiple hospitalizations.  PET  scan in 06/2017 revealed enlarging pulmonary nodules but no evidence of abdominal disease per  patient report.  She is s/p right ileocolectomy on 08/31/2017 for a small bowel obstruction.  She presented with abdominal pain.  Abdomen and pelvic CT scan on 09/13/2017 revealed a small infarct in the inferior pole of the LEFT kidney and RIGHT uterine infarct.  PET scan on 11/08/2017 at the [place] [facility] revealed innumerable stable bilateral cavitary pulmonary nodules (up to 8 mm), some of which demonstrate low level metabolic activity.  There were post treatment changes in the pelvis without evidence of suspicious hypermetabolic activity.  Abdomen and pelvic CT on 12/03/2017 revealed continued multiple pulmonary nodules in the lung bases concerning for metastatic disease.  There was mild wall thickening of rectosigmoid colon is noted.  There was a stable 1.8 x 1.6 cm mixed fat and soft tissue density noted in right lower quadrant which may represent fat necrosis.  There was a larger low density is noted involving uterine fundus.  Pelvic MRI on 12/03/2017 was unremarkable.    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  Testing was negative for prothrombin gene mutation, lupus anticoagulant panel, anticardiolipin antibodies, and beta-2 glycoprotein antibodies.  She was discharged on Xarelto.  She was admitted to [facility] from 12/03/2017 - 12/08/2017 with abdominal pain. She was not felt to have a uterine infarct.  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  The etiology was unclear.  She is hepatitis C positive.   Hepatitis C genotype is 2a/2c.  She has B12 deficiency.  B12 was 165 on 11/27/2017.  She began B12 injections 2 weeks ago.  Symptomatically, she feels better.  LLQ pain has improved from a level 10 to a level 3.  Exam reveals minimal LLQ discomfort on palpation.    Plan:   1.  Discuss interval  hospitalization.  Etiology of abdominal pain unclear.  No evidence of disease progression.  Questionable radiation proctitis.  Follow-up as scheduled with Dr. Marius Ditch tomorrow. 2.  Chest, abdomen, and pelvis CT on 03/02/2018. 3.  Follow up with Dr Marius Ditch re:  diarrhea, residual abdominal pain, and hepatitis C. 4.  Refill Xarelto 20 mg a day  (pharmacy in [place] gave her 15 mg tablets). 5.  Continue B12 supplimentation. 6.  RTC after CT for MD assessment, labs (CBC with diff, CMP, antiparietal Ab, intrinsic factor Ab), and review of imaging studies.  Addendum:  Will request repeat CBC to ensure recovery of WBCs from acute illness.   Honor Loh, NP  12/11/2017, 10:55 AM   I saw and evaluated the patient, participating in the key portions of the service and reviewing pertinent diagnostic studies and records.  I reviewed the nurse practitioner's note and agree with the findings and the plan.  The assessment and plan were discussed with the patient.  Several questions were asked by the patient and answered.   Nolon Stalls, MD 12/11/2017,10:55 AM

## 2017-12-12 ENCOUNTER — Ambulatory Visit: Payer: Medicaid Other | Admitting: Gastroenterology

## 2017-12-12 ENCOUNTER — Other Ambulatory Visit: Payer: Self-pay

## 2017-12-12 ENCOUNTER — Encounter: Payer: Self-pay | Admitting: Gastroenterology

## 2017-12-12 ENCOUNTER — Other Ambulatory Visit: Payer: Self-pay | Admitting: Hematology and Oncology

## 2017-12-12 VITALS — BP 131/93 | HR 96 | Ht 69.0 in | Wt 156.4 lb

## 2017-12-12 DIAGNOSIS — R945 Abnormal results of liver function studies: Secondary | ICD-10-CM | POA: Diagnosis not present

## 2017-12-12 DIAGNOSIS — R1030 Lower abdominal pain, unspecified: Secondary | ICD-10-CM

## 2017-12-12 DIAGNOSIS — D708 Other neutropenia: Secondary | ICD-10-CM | POA: Diagnosis not present

## 2017-12-12 DIAGNOSIS — R9389 Abnormal findings on diagnostic imaging of other specified body structures: Secondary | ICD-10-CM

## 2017-12-12 DIAGNOSIS — D518 Other vitamin B12 deficiency anemias: Secondary | ICD-10-CM | POA: Diagnosis not present

## 2017-12-12 DIAGNOSIS — R7989 Other specified abnormal findings of blood chemistry: Secondary | ICD-10-CM

## 2017-12-12 DIAGNOSIS — C539 Malignant neoplasm of cervix uteri, unspecified: Secondary | ICD-10-CM

## 2017-12-12 NOTE — Progress Notes (Signed)
[name], MD [address]  [place]  [place],  [address]  Main: [phone]  Fax: [phone]    Gastroenterology Consultation  Referring Provider:     No ref. provider found Primary Care Physician:  Patient, No Pcp Per Primary Gastroenterologist:  Dr. [name] Reason for Consultation:     Chronic diarrhea and lower abdominal pain        HPI:   Joanna Hall is a 52 y.o. female referred by Dr. Hampton Abbot for consultation & management of and chronic unexplained diarrhea. Patient has history of cervical cancer underwent chemotherapy XRT and brachytherapy, appears to be stable, closely followed by oncology at [facility]. She had small bowel obstruction in 08/2017, underwent exploratory laparoscopy with ileocolectomy and primary anastomosis. 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 Currently on Xarelto  Patient reports that since her bowel resection, she has been experiencing several episodes of nonbloody diarrhea, up to 12 times per day and night associated with urgency, nocturnal diarrhea as well as fecal incontinence. She does report diffuse lower abdominal cramps as well as intermittent gas/bloating.patient reports that she may have seen worms in her stools on several occasions. She does drink well water. She denies recent travel outside the [place] or recent use of antibiotics. She did have C. Difficile infection in the past when she was undergoing chemotherapy for cervical cancer. She had stool studies for C. Difficile and other GI pathogens including ova and parasites in 10/2017 by Dr. Hampton Abbot and they were negative.She has tried Imodium and Lomotil with no relief. Therefore she was referred to GI by Dr. Hampton Abbot for further management. Patient denies any particular relation to food. Prior to her surgery, patient reports having  alternating episodes of diarrhea and constipation associated with abdominal cramps and bloating. She would have up to 2-3 soft bowel movements daily but not this severe.her weight has been stable. She denies nausea, vomiting, fever, night sweats. She denies rectal bleeding. She is found to have macrocytic anemia which has been stable. She is not on any B12 or iron supplements.   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 She does not have any evidence of chronic liver disease.  Follow-up visit 12/12/2017 Patient was admitted to the hospital early this month secondary to lower abdominal pain, CT revealed mild rectosigmoid thickening. She reports that her diarrhea resolved after initiation of cholestyramine. She is currently taking 4 g twice daily. Having one bowel movement daily. She denies rectal bleeding. Her anal fissure had also significantly improved since control of her diarrhea. She has not received topical nitroglycerin ointment. However, she continues to have lower abdominal pain, constant with no relation to food or bowel movements. She tried dicyclomine which did not help. She is seeing Dr. Mike Gip for follow-up of lung nodules and is scheduled to have a CT scan in 02/2018. Her disease has not progressed to date. She also has B12 deficiency for which she is taking B12 injections. She stopped taking NSAIDs  She denies smoking or drinking alcohol.  NSAIDs: Has stopped taking NSAIDs  Antiplts/Anticoagulants/Anti thrombotics:  on Xarelto for factor V bleeding mutation  GI Procedures:  Colonoscopy at [facility] 10/05/2016 Impression:           - One diminutive polyp in the cecum,                         removed with a cold biopsy forceps.                        Resected and retrieved.                       - One 5 mm polyp in the cecum,                        removed with a jumbo cold forceps.                        Resected and retrieved.                       - One 6 mm polyp in the cecum,                        removed with a hot snare. Resected                        and retrieved.                       - One 5 mm polyp in the descending                        colon, removed with a jumbo cold                        forceps. Resected and retrieved. Date Collected: 10/05/2016 14:48 Diagnosis A.COLON, LABELED AS "CECAL POLYP", BIOPSY:  TUBULAR ADENOMA  SESSILE SERRATED ADENOMA  B.COLON, LABELED AS "TRANSVERSE COLON POLYP", BIOPSY:  TUBULAR ADENOMA  She denies family history of celiac disease, autoimmune conditions, IBD, GI malignancy  Past Medical History:  Diagnosis Date  . Anxiety   . Arthritis   . 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 Cancer ([place])    cervix  . Hepatitis C   . Hypertension   . 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 CHOLECYSTECTOMY    . LAPAROTOMY N/A 08/31/2017   Procedure: EXPLORATORY LAPAROTOMY for SBO, ileocolectomy, removal of piece of uterine wall;  Surgeon: [name], MD;  Location: [facility] ORS;  Service: General;  Laterality: N/A;  . OOPHORECTOMY      Current Outpatient Medications:  .  acetaminophen (TYLENOL) 325 MG tablet, Take 650 mg every 6 (six) hours as needed by mouth., Disp: , Rfl:  .  cholestyramine (QUESTRAN) 4 g packet, Take 1 packet (4 g total) by mouth 3 (three) times daily with meals., Disp: 90 packet, Rfl: 0 .  cyanocobalamin (,VITAMIN B-12,) 1000 MCG/ML injection, Inject 1ML every week x 4 weeks, then 1ML every other week x 2 months, then monthly x 3 months, Disp: 10 mL, Rfl: 1 .  dicyclomine (BENTYL) 10 MG capsule, Take 1 capsule (10 mg total) by  mouth 4 (four)  times daily -  before meals and at bedtime., Disp: 120 capsule, Rfl: 0 .  HYDROcodone-acetaminophen (NORCO/VICODIN) 5-325 MG tablet, Take 1 tablet by mouth every 6 (six) hours as needed for severe pain., Disp: 15 tablet, Rfl: 0 .  hydrocortisone (ANUSOL-HC) 2.5 % rectal cream, Place 1 application rectally 2 (two) times daily., Disp: 30 g, Rfl: 0 .  loperamide (IMODIUM A-D) 2 MG tablet, Take 2 mg by mouth 4 (four) times daily as needed for diarrhea or loose stools., Disp: , Rfl:  .  Multiple Vitamin (MULTIVITAMIN WITH MINERALS) TABS tablet, Take 1 tablet daily by mouth., Disp: , Rfl:  .  rivaroxaban (XARELTO) 20 MG TABS tablet, Take 1 tablet (20 mg total) by mouth daily with supper., Disp: 30 tablet, Rfl: 1 .  Syringe/Needle, Disp, (SYRINGE 3CC/22GX1") 22G X 1" 3 ML MISC, For use with Vitamin B12 injections, Disp: 50 each, Rfl: 0 .  VENTOLIN HFA 108 (90 Base) MCG/ACT inhaler, INHALE 1-2 PUFFS BY MOUTH EVERY 4 HOURS AS NEEDED FOR WHEEZING / SHORTNESS OF BREATH, Disp: , Rfl: 0 .  Vitamin D, Ergocalciferol, (DRISDOL) 50000 units CAPS capsule, Take 1 capsule (50,000 Units total) by mouth every 7 (seven) days for 12 doses., Disp: 12 capsule, Rfl: 0 .  diphenoxylate-atropine (LOMOTIL) 2.5-0.025 MG tablet, Take 2 tablets by mouth 4 (four) times daily as needed for diarrhea or loose stools. Titrate down after 48 hours (Patient not taking: Reported on 12/12/2017), Disp: 50 tablet, Rfl: 0 .  sertraline (ZOLOFT) 50 MG tablet, Take 50 mg daily by mouth., Disp: , Rfl:    History reviewed. No pertinent family history.   Social History   Tobacco Use  . Smoking status: Former Smoker    Last attempt to quit: 10/16/2006    Years since quitting: 11.1  . Smokeless tobacco: Never Used  Substance Use Topics  . Alcohol use: No    Frequency: Never  . Drug use: No    Allergies as of 12/12/2017 - Review Complete 12/12/2017  Allergen Reaction Noted  . Ketamine Other (See Comments) and Anxiety  02/22/2017    Review of Systems:    All systems reviewed and negative except where noted in HPI.   Physical Exam:  BP (!) 131/93   Pulse 96   Ht 5' 9"  (1.753 m)   Wt 156 lb 6.4 oz (70.9 kg)   BMI 23.10 kg/m  No LMP recorded. Patient is postmenopausal.  General:   Alert,  Well-developed, well-nourished, pleasant and cooperative in NAD Head:  Normocephalic and atraumatic. Eyes:  Sclera clear, no icterus.   Conjunctiva pink. Ears:  Normal auditory acuity. Nose:  No deformity, discharge, or lesions. Mouth:  No deformity or lesions,oropharynx pink & moist. Neck:  Supple; no masses or thyromegaly. Lungs:  Respirations even and unlabored.  Clear throughout to auscultation.   No wheezes, crackles, or rhonchi. No acute distress. Heart:  Regular rate and rhythm; no murmurs, clicks, rubs, or gallops. Abdomen:  Normal bowel sounds. Soft,mild diffuse lower abdominal tenderness, and non-distended without masses, hepatosplenomegaly or hernias noted.  No guarding or rebound tenderness.   Rectal: Not performed Msk:  Symmetrical without gross deformities. Good, equal movement & strength bilaterally. Pulses:  Normal pulses noted. Extremities:  No clubbing or edema.  No cyanosis. Neurologic:  Alert and oriented x3;  grossly normal neurologically. Skin:  Intact without significant lesions or rashes. No jaundice. Lymph Nodes:  No significant cervical adenopathy. Psych:  Alert and cooperative. Normal mood and affect.  Imaging Studies: reviewed  Assessment and Plan:   CRYSTALANN KORF is a 52 y.o. female with history of chronic hepatitis C, treatment nave, cervical adenocarcinoma, status post chemotherapy XRT and brachytherapy, disease currently stable, persistent small bowel obstruction, S/P exploratory laparoscopy with ileocolectomy in 08/2017 seen in follow-up for chronic hepatitis C, chronic unexplained diarrhea, chronic anal fissure as well as symptomatic hemorrhoids  Chronic hep C: Genotype  2a/2c, VL 82,400  Recommend treatment of hepatitis C with Epclusa for 12 weeks or Mavyret for 8 weeks There is no evidence of chronic liver disease She does have elevated transaminases and alkaline phosphatase She had alkaline phosphatase Isoenzymes and liver component is elevated, GGT 209, elevated Hepatitis B surface antigen negative HIV nonreactive Check hepatitis B surface antibody, core antibody, hep B antibody total Rule out other secondary causes for elevated LFTs, particularly elevated alkaline phosphatase  Chronic nonbloody diarrhea: Her history is consistent with bile salt induced diarrhea with history of ileocolectomy. Currently well controlled on cholestyramine. pancreatic fecal elastase, fecal calprotectin are normal, CRP came back normal - Continue cholestyramine 4 g 2 times daily with meals - Dicyclomine for abdominal cramps   - avoid NSAIDs  Bilateral lower abdominal pain: CT revealed mild thickening of the rectosigmoid colon Her abdominal pain is most likely secondary to scar tissue - I will perform colonoscopy for further evaluation given his history of cervical cancer and radiation  Chronic anal fissure and symptomatic hemorrhoids: - Her fissure has significantly improved - I will defer outpatient hemorrhoid ligation for now given her ongoing lower abdominal pain   B12 deficiency anemia: - Continue B12 injections as recommended - recheck vitamin B12 levels, check antiparietal cell antibodies, intrinsic factor antibodies per Dr. Mike Gip  - EGD to evaluate for autoimmune gastritis/pernicious anemia  Neutropenia: Recheck CBC today  Follow up in 3 months   [name], MD

## 2017-12-13 ENCOUNTER — Other Ambulatory Visit: Payer: Self-pay

## 2017-12-13 ENCOUNTER — Encounter: Payer: Self-pay | Admitting: Anesthesiology

## 2017-12-13 ENCOUNTER — Encounter: Payer: Self-pay | Admitting: *Deleted

## 2017-12-13 DIAGNOSIS — R9389 Abnormal findings on diagnostic imaging of other specified body structures: Secondary | ICD-10-CM

## 2017-12-13 NOTE — Discharge Instructions (Signed)
General Anesthesia, Adult, Care After °These instructions provide you with information about caring for yourself after your procedure. Your health care provider may also give you more specific instructions. Your treatment has been planned according to current medical practices, but problems sometimes occur. Call your health care provider if you have any problems or questions after your procedure. °What can I expect after the procedure? °After the procedure, it is common to have: °· Vomiting. °· A sore throat. °· Mental slowness. ° °It is common to feel: °· Nauseous. °· Cold or shivery. °· Sleepy. °· Tired. °· Sore or achy, even in parts of your body where you did not have surgery. ° °Follow these instructions at home: °For at least 24 hours after the procedure: °· Do not: °? Participate in activities where you could fall or become injured. °? Drive. °? Use heavy machinery. °? Drink alcohol. °? Take sleeping pills or medicines that cause drowsiness. °? Make important decisions or sign legal documents. °? Take care of children on your own. °· Rest. °Eating and drinking °· If you vomit, drink water, juice, or soup when you can drink without vomiting. °· Drink enough fluid to keep your urine clear or pale yellow. °· Make sure you have little or no nausea before eating solid foods. °· Follow the diet recommended by your health care provider. °General instructions °· Have a responsible adult stay with you until you are awake and alert. °· Return to your normal activities as told by your health care provider. Ask your health care provider what activities are safe for you. °· Take over-the-counter and prescription medicines only as told by your health care provider. °· If you smoke, do not smoke without supervision. °· Keep all follow-up visits as told by your health care provider. This is important. °Contact a health care provider if: °· You continue to have nausea or vomiting at home, and medicines are not helpful. °· You  cannot drink fluids or start eating again. °· You cannot urinate after 8-12 hours. °· You develop a skin rash. °· You have fever. °· You have increasing redness at the site of your procedure. °Get help right away if: °· You have difficulty breathing. °· You have chest pain. °· You have unexpected bleeding. °· You feel that you are having a life-threatening or urgent problem. °This information is not intended to replace advice given to you by your health care provider. Make sure you discuss any questions you have with your health care provider. °Document Released: 01/08/2001 Document Revised: 03/06/2016 Document Reviewed: 09/16/2015 °Elsevier Interactive Patient Education © 2018 [employer] ° °

## 2017-12-13 NOTE — Anesthesia Preprocedure Evaluation (Deleted)
Anesthesia Evaluation    Airway        Dental   Pulmonary former smoker,  PET scan on 11/08/2017 at the [facility] revealed innumerable stable bilateral cavitary pulmonary nodules (up to 8 mm), some of which demonstrate low level metabolic activity.          Cardiovascular hypertension,      Neuro/Psych    GI/Hepatic (+) Hepatitis -, CSBO s/p ileocolectomy 08/31/17  Vitamin B12 deficiency  She was admitted to [facility] from 12/03/2017 - 12/08/2017 with abdominal pain. She was not felt to have a uterine infarct.  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  The etiology was unclear.   Endo/Other    Renal/GU Renal disease (renal infarct)     Musculoskeletal   Abdominal   Peds  Hematology  (+) Blood dyscrasia, anemia , Cervical cancer undergoing concurrent cisplatin and radiation (external beam and brachytherapy) from 11/2016 - 02/2017  Factor V Leiden mutation on Xarelto; pt states over phone that she has "active blood clots," although cannot find verification of this in Epic   Anesthesia Other Findings Right uterine infarct  Reproductive/Obstetrics                             Anesthesia Physical Anesthesia Plan  ASA: IV  Anesthesia Plan:    Post-op Pain Management:    Induction:   PONV Risk Score and Plan:   Airway Management Planned:   Additional Equipment:   Intra-op Plan:   Post-operative Plan:   Informed Consent:   Plan Discussed with:   Anesthesia Plan Comments: (Pt is ASA physical status IV and is not appropriate for treatment at an ambulatory center.  She is currently undergoing radiation and chemotherapy for cervical cancer, was recently found to have Factor V Leiden mutation on Xarelto, has Hepatitis C, had SBO s/p ileocolectomy 08/2017, and was recently hospitalized with abdominal pain of unknown etiology in late February 2019.  The pt  stated over the phone that she has "active blood clots," which would also need to be clarified prior to surgery.)        Anesthesia Quick Evaluation

## 2017-12-13 NOTE — Telephone Encounter (Signed)
Dr. Marius Ditch, I received a call from [place] at [facility] regarding patients colonoscopy and EGD we had scheduled for 12/19/17.  Barnett Applebaum stated that patient is not a candidate based on the fact that she has "Active blood clots in lungs and kidneys".   I've contacted Mrs. Jim to let her know that I will inform you of this and see what you suggested we do as far as scheduling her for her procedures.  She is also on Xarelto.  I've advised her to continue her Xarelto until otherwise told to stop by our office. She would like to have the procedure within the next two weeks.  I'm thinking I could just move her to the [facility] location for procedure.  Please advise. Thanks [employer]

## 2017-12-14 LAB — TISSUE TRANSGLUTAMINASE, IGA

## 2017-12-14 LAB — HEPATITIS B CORE ANTIBODY, TOTAL: HEP B C TOTAL AB: NEGATIVE

## 2017-12-14 LAB — TSH: TSH: 2.46 u[IU]/mL (ref 0.450–4.500)

## 2017-12-14 LAB — HEPATITIS B SURFACE ANTIBODY,QUALITATIVE: Hep B Surface Ab, Qual: NONREACTIVE

## 2017-12-14 LAB — MITOCHONDRIAL ANTIBODIES: Mitochondrial Ab: <20 Units (ref 0.0–20.0)

## 2017-12-14 LAB — IGA: IgA/Immunoglobulin A, Serum: 258 mg/dL (ref 87–352)

## 2017-12-14 LAB — HEPATITIS A ANTIBODY, TOTAL: HEP A TOTAL AB: NEGATIVE

## 2017-12-15 ENCOUNTER — Encounter: Payer: Self-pay | Admitting: Gastroenterology

## 2017-12-17 ENCOUNTER — Telehealth: Payer: Self-pay

## 2017-12-17 ENCOUNTER — Encounter: Payer: Self-pay | Admitting: Gastroenterology

## 2017-12-17 ENCOUNTER — Other Ambulatory Visit: Payer: Self-pay

## 2017-12-17 DIAGNOSIS — D510 Vitamin B12 deficiency anemia due to intrinsic factor deficiency: Secondary | ICD-10-CM

## 2017-12-17 DIAGNOSIS — Z1211 Encounter for screening for malignant neoplasm of colon: Secondary | ICD-10-CM

## 2017-12-17 NOTE — Telephone Encounter (Signed)
thx

## 2017-12-17 NOTE — Telephone Encounter (Signed)
Patients colonoscopy has been scheduled at [facility] on 12/20/17.  Advised her to stop Xarelto 2 days prior and restart one day after per Dr. Mike Gip.  Also patient stated that her hemorrhoids are bleeding and the pain medication  Is not helping-any suggestions as to what we can do?  Thanks [employer]

## 2017-12-19 ENCOUNTER — Ambulatory Visit
Admission: RE | Admit: 2017-12-19 | Discharge: 2017-12-19 | Disposition: A | Discharge status: Home / Self Care | Payer: Self-pay | Source: Ambulatory Visit | Attending: Hematology and Oncology | Admitting: Hematology and Oncology

## 2017-12-19 ENCOUNTER — Ambulatory Visit: Admit: 2017-12-19 | Payer: Medicaid Other | Admitting: Gastroenterology

## 2017-12-19 ENCOUNTER — Other Ambulatory Visit: Payer: Self-pay | Admitting: Hematology and Oncology

## 2017-12-19 ENCOUNTER — Other Ambulatory Visit
Admission: RE | Admit: 2017-12-19 | Discharge: 2017-12-19 | Disposition: A | Discharge status: Home / Self Care | Payer: Medicaid Other | Source: Ambulatory Visit | Attending: Orthopedic Surgery | Admitting: Orthopedic Surgery

## 2017-12-19 DIAGNOSIS — R102 Pelvic and perineal pain: Secondary | ICD-10-CM

## 2017-12-19 DIAGNOSIS — M7540 Impingement syndrome of unspecified shoulder: Secondary | ICD-10-CM | POA: Insufficient documentation

## 2017-12-19 DIAGNOSIS — R079 Chest pain, unspecified: Secondary | ICD-10-CM

## 2017-12-19 DIAGNOSIS — M2550 Pain in unspecified joint: Secondary | ICD-10-CM | POA: Diagnosis present

## 2017-12-19 DIAGNOSIS — M1711 Unilateral primary osteoarthritis, right knee: Secondary | ICD-10-CM | POA: Insufficient documentation

## 2017-12-19 DIAGNOSIS — R52 Pain, unspecified: Secondary | ICD-10-CM

## 2017-12-19 HISTORY — DX: Gastro-esophageal reflux disease without esophagitis: K21.9

## 2017-12-19 HISTORY — DX: Personal history of antineoplastic chemotherapy: Z92.21

## 2017-12-19 HISTORY — DX: Torticollis: M43.6

## 2017-12-19 LAB — URIC ACID: Uric Acid, Serum: 6.6 mg/dL (ref 2.3–6.6)

## 2017-12-19 LAB — C-REACTIVE PROTEIN

## 2017-12-19 SURGERY — COLONOSCOPY WITH PROPOFOL
Anesthesia: Choice

## 2017-12-20 ENCOUNTER — Ambulatory Visit
Admission: RE | Admit: 2017-12-20 | Discharge: 2017-12-20 | Disposition: A | Discharge status: Home / Self Care | Payer: Medicaid Other | Source: Ambulatory Visit | Attending: Gastroenterology | Admitting: Gastroenterology

## 2017-12-20 ENCOUNTER — Ambulatory Visit: Payer: Medicaid Other | Admitting: Registered Nurse

## 2017-12-20 ENCOUNTER — Encounter
Admission: RE | Disposition: A | Discharge status: Home / Self Care | Payer: Self-pay | Source: Ambulatory Visit | Attending: Gastroenterology

## 2017-12-20 DIAGNOSIS — K625 Hemorrhage of anus and rectum: Secondary | ICD-10-CM | POA: Diagnosis not present

## 2017-12-20 DIAGNOSIS — Z87891 Personal history of nicotine dependence: Secondary | ICD-10-CM | POA: Diagnosis not present

## 2017-12-20 DIAGNOSIS — Z8541 Personal history of malignant neoplasm of cervix uteri: Secondary | ICD-10-CM | POA: Insufficient documentation

## 2017-12-20 DIAGNOSIS — K5520 Angiodysplasia of colon without hemorrhage: Secondary | ICD-10-CM | POA: Insufficient documentation

## 2017-12-20 DIAGNOSIS — Z7901 Long term (current) use of anticoagulants: Secondary | ICD-10-CM | POA: Insufficient documentation

## 2017-12-20 DIAGNOSIS — D510 Vitamin B12 deficiency anemia due to intrinsic factor deficiency: Secondary | ICD-10-CM

## 2017-12-20 DIAGNOSIS — D519 Vitamin B12 deficiency anemia, unspecified: Secondary | ICD-10-CM | POA: Insufficient documentation

## 2017-12-20 DIAGNOSIS — K3189 Other diseases of stomach and duodenum: Secondary | ICD-10-CM | POA: Diagnosis not present

## 2017-12-20 DIAGNOSIS — R933 Abnormal findings on diagnostic imaging of other parts of digestive tract: Secondary | ICD-10-CM | POA: Diagnosis not present

## 2017-12-20 DIAGNOSIS — K219 Gastro-esophageal reflux disease without esophagitis: Secondary | ICD-10-CM | POA: Diagnosis not present

## 2017-12-20 DIAGNOSIS — Z980 Intestinal bypass and anastomosis status: Secondary | ICD-10-CM | POA: Insufficient documentation

## 2017-12-20 DIAGNOSIS — A630 Anogenital (venereal) warts: Secondary | ICD-10-CM | POA: Insufficient documentation

## 2017-12-20 DIAGNOSIS — Z79899 Other long term (current) drug therapy: Secondary | ICD-10-CM | POA: Diagnosis not present

## 2017-12-20 DIAGNOSIS — F419 Anxiety disorder, unspecified: Secondary | ICD-10-CM | POA: Insufficient documentation

## 2017-12-20 DIAGNOSIS — K2950 Unspecified chronic gastritis without bleeding: Secondary | ICD-10-CM | POA: Diagnosis not present

## 2017-12-20 DIAGNOSIS — K627 Radiation proctitis: Secondary | ICD-10-CM | POA: Diagnosis not present

## 2017-12-20 DIAGNOSIS — I10 Essential (primary) hypertension: Secondary | ICD-10-CM | POA: Insufficient documentation

## 2017-12-20 DIAGNOSIS — B1920 Unspecified viral hepatitis C without hepatic coma: Secondary | ICD-10-CM | POA: Insufficient documentation

## 2017-12-20 DIAGNOSIS — Z1211 Encounter for screening for malignant neoplasm of colon: Secondary | ICD-10-CM

## 2017-12-20 HISTORY — PX: COLONOSCOPY WITH PROPOFOL: SHX5780

## 2017-12-20 HISTORY — PX: ESOPHAGOGASTRODUODENOSCOPY (EGD) WITH PROPOFOL: SHX5813

## 2017-12-20 LAB — ANTISTREPTOLYSIN O TITER: ASO: 235 IU/mL — ABNORMAL HIGH (ref 0.0–200.0)

## 2017-12-20 LAB — POCT PREGNANCY, URINE
PREG TEST UR: NEGATIVE
PREG TEST UR: NEGATIVE
Preg Test, Ur: POSITIVE — AB

## 2017-12-20 LAB — RHEUMATOID FACTOR: Rhuematoid fact SerPl-aCnc: 12.5 IU/mL (ref 0.0–13.9)

## 2017-12-20 SURGERY — COLONOSCOPY WITH PROPOFOL
Anesthesia: General

## 2017-12-20 MED ORDER — PROPOFOL 500 MG/50ML IV EMUL
INTRAVENOUS | Status: DC | PRN
  Administered 2017-12-20: 160 ug/kg/min via INTRAVENOUS

## 2017-12-20 MED ORDER — GLYCOPYRROLATE 0.2 MG/ML IJ SOLN
INTRAMUSCULAR | Status: DC | PRN
  Administered 2017-12-20: 0.2 mg via INTRAVENOUS

## 2017-12-20 MED ORDER — SODIUM CHLORIDE 0.9 % IV SOLN
INTRAVENOUS | Status: DC
  Administered 2017-12-20: 09:00:00 via INTRAVENOUS

## 2017-12-20 MED ORDER — PROPOFOL 10 MG/ML IV BOLUS
INTRAVENOUS | Status: DC | PRN
  Administered 2017-12-20: 70 mg via INTRAVENOUS

## 2017-12-20 MED ORDER — MIDAZOLAM HCL 2 MG/2ML IJ SOLN
INTRAMUSCULAR | Status: DC | PRN
  Administered 2017-12-20: 1 mg via INTRAVENOUS

## 2017-12-20 MED ORDER — MIDAZOLAM HCL 2 MG/2ML IJ SOLN
INTRAMUSCULAR | Status: AC
  Filled 2017-12-20: qty 2

## 2017-12-20 MED ORDER — LIDOCAINE HCL (CARDIAC) 20 MG/ML IV SOLN
INTRAVENOUS | Status: DC | PRN
  Administered 2017-12-20: 60 mg via INTRAVENOUS

## 2017-12-20 NOTE — Op Note (Signed)
[facility] Gastroenterology Patient Name: Joanna Hall Procedure Date: 12/20/2017 9:03 AM MRN: [id number] Account #: [id number] Date of Birth: 1965/12/08 Admit Type: Outpatient Age: 52 Room: [facility] ENDO ROOM 2 Gender: Female Note Status: Finalized Procedure:            Upper GI endoscopy Indications:          B12 deficiency anemia Providers:            [name] MD, MD Referring MD:         Arnetha Courser (Referring MD) Medicines:            Monitored Anesthesia Care Complications:        No immediate complications. Estimated blood loss:                        Minimal. Procedure:            Pre-Anesthesia Assessment:                       - Prior to the procedure, a History and Physical was                        performed, and patient medications and allergies were                        reviewed. The patient is competent. The risks and                        benefits of the procedure and the sedation options and                        risks were discussed with the patient. All questions                        were answered and informed consent was obtained.                        Patient identification and proposed procedure were                        verified by the physician, the nurse, the                        anesthesiologist, the anesthetist and the technician in                        the pre-procedure area in the procedure room in the                        endoscopy suite. Mental Status Examination: alert and                        oriented. Airway Examination: normal oropharyngeal                        airway and neck mobility. Respiratory Examination:                        clear to auscultation. CV Examination: normal.  Prophylactic Antibiotics: The patient does not require                        prophylactic antibiotics. Prior Anticoagulants: The                        patient has taken Xarelto (rivaroxaban),  last dose was                        2 days prior to procedure. ASA Grade Assessment: III -                        A patient with severe systemic disease. After reviewing                        the risks and benefits, the patient was deemed in                        satisfactory condition to undergo the procedure. The                        anesthesia plan was to use monitored anesthesia care                        (MAC). Immediately prior to administration of                        medications, the patient was re-assessed for adequacy                        to receive sedatives. The heart rate, respiratory rate,                        oxygen saturations, blood pressure, adequacy of                        pulmonary ventilation, and response to care were                        monitored throughout the procedure. The physical status                        of the patient was re-assessed after the procedure.                       After obtaining informed consent, the endoscope was                        passed under direct vision. Throughout the procedure,                        the patient's blood pressure, pulse, and oxygen                        saturations were monitored continuously. The Endoscope                        was introduced through the mouth, and advanced to the  second part of duodenum. The upper GI endoscopy was                        accomplished without difficulty. The patient tolerated                        the procedure well. The upper GI endoscopy was                        accomplished without difficulty. The patient tolerated                        the procedure well. Findings:      The duodenal bulb and second portion of the duodenum were normal.      Diffuse mildly erythematous mucosa without bleeding was found in the       entire examined stomach. Biopsies were taken with a cold forceps for       histology separately from antrum and body.       The cardia and gastric fundus were normal on retroflexion.      The gastroesophageal junction and examined esophagus were normal. Impression:           - Normal duodenal bulb and second portion of the                        duodenum.                       - Erythematous mucosa in the stomach. Biopsied.                       - Normal gastroesophageal junction and esophagus. Recommendation:       - Await pathology results.                       - Proceed with colonoscopy as scheduled                       See colonoscopy report Procedure Code(s):    --- Professional ---                       [id number], Esophagogastroduodenoscopy, flexible, transoral;                        with biopsy, single or multiple Diagnosis Code(s):    --- Professional ---                       K31.89, Other diseases of stomach and duodenum CPT copyright 2016 American Medical Association. All rights reserved. The codes documented in this report are preliminary and upon coder review may  be revised to meet current compliance requirements. Dr. Ulyess Mort [name] MD, MD 12/20/2017 9:17:22 AM This report has been signed electronically. Number of Addenda: 0 Note Initiated On: 12/20/2017 9:03 AM      [facility]

## 2017-12-20 NOTE — Op Note (Signed)
[facility] Patient Name: Joanna Hall Procedure Date: 12/20/2017 9:02 AM MRN: [id number] Account #: [id number] Date of Birth: February 03, 1966 Admit Type: Outpatient Age: 52 Room: [facility] ENDO ROOM 2 Gender: Female Note Status: Finalized Procedure:            Colonoscopy Indications:          Rectal bleeding, , Abnormal CT of the GI tract, rectal                        thickening, h/o cervical cancer with XRT Providers:            [name] MD, MD Referring MD:         Arnetha Courser (Referring MD) Medicines:            Monitored Anesthesia Care Complications:        No immediate complications. Estimated blood loss:                        Minimal. Procedure:            Pre-Anesthesia Assessment:                       - Prior to the procedure, a History and Physical was                        performed, and patient medications and allergies were                        reviewed. The patient is competent. The risks and                        benefits of the procedure and the sedation options and                        risks were discussed with the patient. All questions                        were answered and informed consent was obtained.                        Patient identification and proposed procedure were                        verified by the physician, the nurse, the                        anesthesiologist, the anesthetist and the technician in                        the pre-procedure area in the procedure room in the                        endoscopy suite. Mental Status Examination: alert and                        oriented. Airway Examination: normal oropharyngeal                        airway and neck mobility. Respiratory Examination:  clear to auscultation. CV Examination: normal.                        Prophylactic Antibiotics: The patient does not require                        prophylactic antibiotics.  Prior Anticoagulants: The                        patient has taken Xarelto (rivaroxaban), last dose was                        2 days prior to procedure. ASA Grade Assessment: III -                        A patient with severe systemic disease. After reviewing                        the risks and benefits, the patient was deemed in                        satisfactory condition to undergo the procedure. The                        anesthesia plan was to use monitored anesthesia care                        (MAC). Immediately prior to administration of                        medications, the patient was re-assessed for adequacy                        to receive sedatives. The heart rate, respiratory rate,                        oxygen saturations, blood pressure, adequacy of                        pulmonary ventilation, and response to care were                        monitored throughout the procedure. The physical status                        of the patient was re-assessed after the procedure.                       After obtaining informed consent, the colonoscope was                        passed under direct vision. Throughout the procedure,                        the patient's blood pressure, pulse, and oxygen                        saturations were monitored continuously. The  Colonoscope was introduced through the anus and                        advanced to the the ileocolonic anastomosis. The                        colonoscopy was performed without difficulty. The                        patient tolerated the procedure well. The quality of                        the bowel preparation was excellent. Findings:      The perianal exam findings include perianal condylomata.      There was evidence of a prior end-to-side ileo-colonic anastomosis in       the ascending colon. This was patent and was characterized by healthy       appearing mucosa. The anastomosis was  traversed.      The terminal ileum appeared normal. Biopsies were taken with a cold       forceps for histology.      Multiple small diffuse angioectasias without bleeding were found in the       rectum c/w radiation proctitis, mild.      Normal mucosa was found in the entire colon. Biopsies for histology were       taken with a cold forceps from the entire colon for evaluation of       microscopic colitis.      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                       - Patent end-to-side ileo-colonic anastomosis,                        characterized by healthy appearing mucosa.                       - The examined portion of the ileum was normal.                        Biopsied.                       - Multiple non-bleeding colonic angioectasias.                       - Normal mucosa in the entire examined colon. Biopsied. Recommendation:       - Discharge patient to home.                       - Resume previous diet today.                       - Continue present medications.                       - Await pathology results.                       -  Resume Xarelto (rivaroxaban) at prior dose today.                       - Return to my office as previously scheduled. Procedure Code(s):    --- Professional ---                       [phone], Colonoscopy, flexible; with biopsy, single or                        multiple Diagnosis Code(s):    --- Professional ---                       A63.0, Anogenital (venereal) warts                       Z98.0, Intestinal bypass and anastomosis status                       K55.20, Angiodysplasia of colon without hemorrhage                       K62.5, Hemorrhage of anus and rectum                       R93.3, Abnormal findings on diagnostic imaging of other                        parts of digestive tract CPT copyright 2016 American  Medical Association. All rights reserved. The codes documented in this report are preliminary and upon coder review may  be revised to meet current compliance requirements. Dr. Ulyess Mort [name] MD, MD 12/20/2017 9:37:42 AM This report has been signed electronically. Number of Addenda: 0 Note Initiated On: 12/20/2017 9:02 AM Scope Withdrawal Time: 0 hours 9 minutes 50 seconds  Total Procedure Duration: 0 hours 11 minutes 25 seconds       [facility]

## 2017-12-20 NOTE — H&P (Signed)
[name], MD [address]  [place]  [place], [place] [address]  Main: [phone]  Fax: [phone] Pager: [phone]  Primary Care Physician:  Arnetha Courser, MD Primary Gastroenterologist:  Dr. [name]  Pre-Procedure History & Physical: HPI:  Joanna Hall is a 52 y.o. female is here for an endoscopy and colonoscopy.   Past Medical History:  Diagnosis Date  . Anxiety   . Arthritis    neck and knees  . 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 Cancer ([place])    cervix  . GERD (gastroesophageal reflux disease)   . Hepatitis C   . History of cancer chemotherapy    completed 06/2017  . Hypertension   . 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 Stiff neck    limited right turn    Past Surgical History:  Procedure Laterality Date  . CHOLECYSTECTOMY    . COLON SURGERY  08/2017   resection  . LAPAROTOMY N/A 08/31/2017   Procedure: EXPLORATORY LAPAROTOMY for SBO, ileocolectomy, removal of piece of uterine wall;  Surgeon: [name], MD;  Location: [facility] ORS;  Service: General;  Laterality: N/A;  . OOPHORECTOMY    . THORACOTOMY      Prior to Admission medications   Medication Sig Start Date End Date Taking? Authorizing Provider  acetaminophen (TYLENOL) 325 MG tablet Take 650 mg every 6 (six) hours as needed by mouth.   Yes [provider]  cholestyramine (QUESTRAN) 4 g packet Take 1 packet (4 g total) by mouth 3 (three) times daily with meals. 11/27/17 12/27/17 Yes Joane Postel, Tally Due, MD  cyanocobalamin (,VITAMIN B-12,) 1000 MCG/ML injection Inject 1ML every week x 4 weeks, then 1ML every other week x 2 months, then monthly x 3 months 11/29/17  Yes Hilaria Titsworth, Tally Due, MD  dicyclomine (BENTYL) 10 MG capsule Take 1 capsule (10 mg total) by mouth 4 (four) times daily -  before meals and at bedtime. 11/27/17 12/27/17 Yes Stefana Lodico, Tally Due, MD  loperamide (IMODIUM A-D) 2 MG tablet Take 2 mg by mouth 4 (four) times daily as needed  for diarrhea or loose stools.   Yes [provider]  VENTOLIN HFA 108 (90 Base) MCG/ACT inhaler INHALE 1-2 PUFFS BY MOUTH EVERY 4 HOURS AS NEEDED FOR WHEEZING / SHORTNESS OF BREATH 08/02/17  Yes [provider]  Vitamin D, Ergocalciferol, (DRISDOL) 50000 units CAPS capsule Take 1 capsule (50,000 Units total) by mouth every 7 (seven) days for 12 doses. 11/29/17 02/15/18 Yes Codee Tutson, Tally Due, MD  diphenoxylate-atropine (LOMOTIL) 2.5-0.025 MG tablet Take 2 tablets by mouth 4 (four) times daily as needed for diarrhea or loose stools. Titrate down after 48 hours Patient not taking: Reported on 12/12/2017 11/14/17   [name], MD  HYDROcodone-acetaminophen (NORCO/VICODIN) 5-325 MG tablet Take 1 tablet by mouth every 6 (six) hours as needed for severe pain. Patient not taking: Reported on 12/13/2017 12/08/17   Hillary Bow, MD  hydrocortisone (ANUSOL-HC) 2.5 % rectal cream Place 1 application rectally 2 (two) times daily. 11/15/17   [name], MD  Multiple Vitamin (MULTIVITAMIN WITH MINERALS) TABS tablet Take 1 tablet daily by mouth.    [provider]  rivaroxaban (XARELTO) 20 MG TABS tablet Take 1 tablet (20 mg total) by mouth daily with supper. 12/11/17   Karen Kitchens, NP  sertraline (ZOLOFT) 50 MG tablet Take 50 mg daily by mouth.    [provider]  Syringe/Needle, Disp, (  SYRINGE 3CC/22GX1") 22G X 1" 3 ML MISC For use with Vitamin B12 injections 11/29/17   [name], MD    Allergies as of 12/17/2017 - Review Complete 12/13/2017  Allergen Reaction Noted  . Ketamine Other (See Comments) and Anxiety 02/22/2017    History reviewed. No pertinent family history.  Social History   Socioeconomic History  . Marital status: Legally Separated    Spouse name: Not on file  . Number of children: Not on file  . Years of education: Not on file  . Highest education level: Not on file  Social Needs  . Financial resource strain: Not on file  . Food  insecurity - worry: Not on file  . Food insecurity - inability: Not on file  . Transportation needs - medical: Not on file  . Transportation needs - non-medical: Not on file  Occupational History  . Not on file  Tobacco Use  . Smoking status: Former Smoker    Last attempt to quit: 10/16/2006    Years since quitting: 11.1  . Smokeless tobacco: Never Used  Substance and Sexual Activity  . Alcohol use: Yes    Frequency: Never    Comment: may have 1 drink/month. none last 24hrs  . Drug use: No  . Sexual activity: Not on file    Comment: Not Asked  Other Topics Concern  . Not on file  Social History Narrative  . Not on file    Review of Systems: See HPI, otherwise negative ROS  Physical Exam: BP 110/60   Pulse 89   Temp (!) 96.4 F (35.8 C) (Tympanic)   Resp 16   Ht 5' 9"  (1.753 m)   Wt 158 lb (71.7 kg)   SpO2 95%   BMI 23.33 kg/m  General:   Alert,  pleasant and cooperative in NAD Head:  Normocephalic and atraumatic. Neck:  Supple; no masses or thyromegaly. Lungs:  Clear throughout to auscultation.    Heart:  Regular rate and rhythm. Abdomen:  Soft, nontender and nondistended. Normal bowel sounds, without guarding, and without rebound.   Neurologic:  Alert and  oriented x4;  grossly normal neurologically.  Impression/Plan: Joanna Hall is here for an endoscopy and colonoscopy to be performed for B12 deficiency and rectal thickening on CT, rectal bleeding  Risks, benefits, limitations, and alternatives regarding  endoscopy and colonoscopy have been reviewed with the patient.  Questions have been answered.  All parties agreeable.   Sherri Sear, MD  12/20/2017, 9:02 AM

## 2017-12-20 NOTE — Transfer of Care (Signed)
Immediate Anesthesia Transfer of Care Note  Patient: Joanna Hall  Procedure(s) Performed: COLONOSCOPY WITH PROPOFOL (N/A ) ESOPHAGOGASTRODUODENOSCOPY (EGD) WITH PROPOFOL (N/A )  Patient Location: PACU  Anesthesia Type:General  Level of Consciousness: awake and alert   Airway & Oxygen Therapy: Patient Spontanous Breathing and Patient connected to nasal cannula oxygen  Post-op Assessment: Report given to RN and Post -op Vital signs reviewed and stable  Post vital signs: Reviewed and stable  Last Vitals:  Vitals:   12/20/17 0935 12/20/17 0936  BP: (!) 84/53 (!) 84/53  Pulse: 84   Resp: 12 14  Temp: (!) 36 C (!) 36 C  SpO2: 100% 99%    Last Pain:  Vitals:   12/20/17 0935  TempSrc: Tympanic  PainSc: 0-No pain         Complications: No apparent anesthesia complications

## 2017-12-20 NOTE — OR Nursing (Deleted)
3 pregnancy tests documented, only done. Test appeared as positive on ist test, tried to change and could on add as negx2.

## 2017-12-20 NOTE — Anesthesia Preprocedure Evaluation (Signed)
Anesthesia Evaluation  Patient identified by MRN, date of birth, ID band Patient awake    Reviewed: Allergy & Precautions, NPO status , Patient's Chart, lab work & pertinent test results  History of Anesthesia Complications Negative for: history of anesthetic complications  Airway Mallampati: III  TM Distance: >3 FB Neck ROM: Full    Dental  (+) Missing   Pulmonary neg sleep apnea, neg COPD, former smoker,    breath sounds clear to auscultation- rhonchi (-) wheezing      Cardiovascular hypertension, Pt. on medications + DVT (hx of DVT)  (-) CAD, (-) Past MI, (-) Cardiac Stents and (-) CABG  Rhythm:Regular Rate:Normal - Systolic murmurs and - Diastolic murmurs    Neuro/Psych Anxiety negative neurological ROS     GI/Hepatic GERD  ,(+) Hepatitis -, C  Endo/Other  negative endocrine ROSneg diabetes  Renal/GU negative Renal ROS     Musculoskeletal  (+) Arthritis ,   Abdominal (+) - obese,   Peds  Hematology  (+) anemia ,   Anesthesia Other Findings Past Medical History: No date: Anxiety No date: Arthritis     Comment:  neck and knees No date: Blood clots in brain     Comment:  both lungs and right kidney No date: Cancer ([place])     Comment:  cervix No date: GERD (gastroesophageal reflux disease) No date: Hepatitis C No date: History of cancer chemotherapy     Comment:  completed 06/2017 No date: Hypertension left kidney: Infarction of kidney ([facility]) No date: Stiff neck     Comment:  limited right turn   Reproductive/Obstetrics                            Anesthesia Physical Anesthesia Plan  ASA: II  Anesthesia Plan: General   Post-op Pain Management:    Induction: Intravenous  PONV Risk Score and Plan: Propofol infusion  Airway Management Planned: Natural Airway  Additional Equipment:   Intra-op Plan:   Post-operative Plan:   Informed Consent: I have reviewed the patients  History and Physical, chart, labs and discussed the procedure including the risks, benefits and alternatives for the proposed anesthesia with the patient or authorized representative who has indicated his/her understanding and acceptance.   Dental advisory given  Plan Discussed with: CRNA and Anesthesiologist  Anesthesia Plan Comments:         Anesthesia Quick Evaluation

## 2017-12-20 NOTE — Anesthesia Postprocedure Evaluation (Signed)
Anesthesia Post Note  Patient: DAHNA HATTABAUGH  Procedure(s) Performed: COLONOSCOPY WITH PROPOFOL (N/A ) ESOPHAGOGASTRODUODENOSCOPY (EGD) WITH PROPOFOL (N/A )  Patient location during evaluation: Endoscopy Anesthesia Type: General Level of consciousness: awake and alert and oriented Pain management: pain level controlled Vital Signs Assessment: post-procedure vital signs reviewed and stable Respiratory status: spontaneous breathing, nonlabored ventilation and respiratory function stable Cardiovascular status: blood pressure returned to baseline and stable Postop Assessment: no signs of nausea or vomiting Anesthetic complications: no     Last Vitals:  Vitals:   12/20/17 0955 12/20/17 1005  BP: 118/87 138/87  Pulse: 69 62  Resp: 18 12  Temp:    SpO2: 100% 100%    Last Pain:  Vitals:   12/20/17 0935  TempSrc: Tympanic  PainSc: 0-No pain                 Katelind Pytel

## 2017-12-20 NOTE — OR Nursing (Signed)
There was and error in first pregancy test and was corrected by posting a second test;which showes positive instead of negative, A third test was posted as neg. Only 1 test done and only charge for I1 ua pregancy test.

## 2017-12-20 NOTE — Anesthesia Post-op Follow-up Note (Signed)
Anesthesia QCDR form completed.        

## 2017-12-21 ENCOUNTER — Other Ambulatory Visit
Admission: RE | Admit: 2017-12-21 | Discharge: 2017-12-21 | Disposition: A | Discharge status: Home / Self Care | Payer: Medicaid Other | Source: Ambulatory Visit | Attending: Gastroenterology | Admitting: Gastroenterology

## 2017-12-21 ENCOUNTER — Encounter: Payer: Self-pay | Admitting: Gastroenterology

## 2017-12-21 DIAGNOSIS — C539 Malignant neoplasm of cervix uteri, unspecified: Secondary | ICD-10-CM | POA: Diagnosis not present

## 2017-12-21 DIAGNOSIS — C538 Malignant neoplasm of overlapping sites of cervix uteri: Secondary | ICD-10-CM | POA: Insufficient documentation

## 2017-12-21 LAB — SURGICAL PATHOLOGY

## 2017-12-21 LAB — COMPREHENSIVE METABOLIC PANEL
ALT: 87 U/L — ABNORMAL HIGH (ref 14–54)
AST: 46 U/L — ABNORMAL HIGH (ref 15–41)
Albumin: 3.9 g/dL (ref 3.5–5.0)
Alkaline Phosphatase: 167 U/L — ABNORMAL HIGH (ref 38–126)
Anion gap: 8 (ref 5–15)
BUN: 17 mg/dL (ref 6–20)
CO2: 24 mmol/L (ref 22–32)
Calcium: 9 mg/dL (ref 8.9–10.3)
Chloride: 105 mmol/L (ref 101–111)
Creatinine, Ser: 0.83 mg/dL (ref 0.44–1.00)
GFR calc Af Amer: >60 mL/min (ref >60)
GFR calc non Af Amer: >60 mL/min (ref >60)
Glucose, Bld: 62 mg/dL — ABNORMAL LOW (ref 65–99)
Potassium: 3.8 mmol/L (ref 3.5–5.1)
Sodium: 137 mmol/L (ref 135–145)
Total Bilirubin: 0.4 mg/dL (ref 0.3–1.2)
Total Protein: 8.1 g/dL (ref 6.5–8.1)

## 2017-12-21 LAB — CBC WITH DIFFERENTIAL/PLATELET
Basophils Absolute: 0 10*3/uL (ref 0–0.1)
Basophils Relative: 0 %
Eosinophils Absolute: 0 10*3/uL (ref 0–0.7)
Eosinophils Relative: 1 %
HCT: 35.3 % (ref 35.0–47.0)
Hemoglobin: 11.8 g/dL — ABNORMAL LOW (ref 12.0–16.0)
Lymphocytes Relative: 22 %
Lymphs Abs: 1 10*3/uL (ref 1.0–3.6)
MCH: 34.5 pg — ABNORMAL HIGH (ref 26.0–34.0)
MCHC: 33.5 g/dL (ref 32.0–36.0)
MCV: 102.8 fL — ABNORMAL HIGH (ref 80.0–100.0)
Monocytes Absolute: 0.2 10*3/uL (ref 0.2–0.9)
Monocytes Relative: 4 %
Neutro Abs: 3.3 10*3/uL (ref 1.4–6.5)
Neutrophils Relative %: 73 %
Platelets: 200 10*3/uL (ref 150–440)
RBC: 3.44 MIL/uL — ABNORMAL LOW (ref 3.80–5.20)
RDW: 13.9 % (ref 11.5–14.5)
WBC: 4.4 10*3/uL (ref 3.6–11.0)

## 2017-12-21 NOTE — OR Nursing (Signed)
C/o '' diarrhea ' ALL night. With a little blood, to call dr Marius Ditch this am.

## 2017-12-22 LAB — FANA STAINING PATTERNS: Speckled Pattern: 1:80 {titer}

## 2017-12-22 LAB — INTRINSIC FACTOR ANTIBODIES: Intrinsic Factor: 1.1 AU/mL (ref 0.0–1.1)

## 2017-12-22 LAB — ANTINUCLEAR ANTIBODIES, IFA: ANA Ab, IFA: POSITIVE — AB

## 2017-12-23 LAB — ANTI-PARIETAL ANTIBODY: Parietal Cell Antibody-IgG: 4.7 Units (ref 0.0–20.0)

## 2017-12-26 ENCOUNTER — Encounter: Payer: Self-pay | Admitting: Gastroenterology

## 2017-12-27 ENCOUNTER — Other Ambulatory Visit: Payer: Self-pay

## 2017-12-27 ENCOUNTER — Other Ambulatory Visit
Admission: RE | Admit: 2017-12-27 | Discharge: 2017-12-27 | Disposition: A | Discharge status: Home / Self Care | Payer: Medicaid Other | Source: Ambulatory Visit | Attending: Gastroenterology | Admitting: Gastroenterology

## 2017-12-27 ENCOUNTER — Encounter: Payer: Self-pay | Admitting: Gastroenterology

## 2017-12-27 ENCOUNTER — Encounter: Payer: Self-pay | Admitting: Hematology and Oncology

## 2017-12-27 ENCOUNTER — Encounter: Payer: Self-pay | Admitting: Family Medicine

## 2017-12-27 DIAGNOSIS — B182 Chronic viral hepatitis C: Secondary | ICD-10-CM

## 2017-12-27 DIAGNOSIS — M2550 Pain in unspecified joint: Secondary | ICD-10-CM | POA: Diagnosis present

## 2017-12-29 LAB — MISC LABCORP TEST (SEND OUT): Labcorp test code: 550123

## 2017-12-31 ENCOUNTER — Ambulatory Visit: Payer: Self-pay | Admitting: Surgery

## 2017-12-31 NOTE — H&P (Signed)
[name] Documented: 12/31/2017 3:39 PM Location: [place] Office Patient #: [id number] DOB: Jan 27, 1966 Divorced / Language: Joanna Hall / Race: White Female  History of Present Illness Adin Hector MD; 12/31/2017 4:58 PM) The patient is a 52 year old female who presents with anal lesions. Note for "Anal lesions": ` ` ` Patient sent for surgical consultation at the request of Dr. Sherri Sear  Chief Complaint: perianal warts  The patient is a pleasant female that had some painful lumps around her anus for a while. Bleeding an irritating. She recently required urgent surgery last fall in [facility] for bowel obstruction most likely due to adhesions and thickening of her ileum from prior radiation for cervical cancer. an of having about a foot of ileum resected with ileocecectomy. She used to move her bowels once or twice a day. Now she goes about 5 or 10 times a day. Is getting under better control with fiber, Imodium, cholestyramine, Bentyl. She had a 6 month follow-up colonoscopy and was had a few polyps removed. She is also found to have some anal canal and perianal nodules very suspicious for warts/condyloma. Surgical consultation requested.  No personal nor family history of GI/colon cancer, inflammatory bowel disease, irritable bowel syndrome, allergy such as Celiac Sprue, dietary/dairy problems, colitis, ulcers nor gastritis. No recent sick contacts/gastroenteritis. No travel outside the country. No changes in diet. No dysphagia to solids or liquids. No significant heartburn or reflux. No Melena, hematemesis, coffee ground emesis. No evidence of prior gastric/peptic ulceration. Occasional rectal bleeding with some more loose bowel movements but nothing too severe. 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 On chronic Xerelto anticoagulation.  (Review of systems as stated in this history (HPI) or in the review of systems. Otherwise all other 12 point ROS are  negative) ` ` `   Past Surgical History Illene Hall, CMA; 12/31/2017 3:39 PM) Colon Polyp Removal - Colonoscopy Gallbladder Surgery - Laparoscopic Lung Surgery Left. Resection of Small Bowel  Diagnostic Studies History Illene Hall, CMA; 12/31/2017 3:39 PM) Colonoscopy within last year Mammogram 1-3 years ago Pap Smear 1-5 years ago  Medication History Illene Hall, CMA; 12/31/2017 3:43 PM) Lucrezia Starch (4GM Packet, Oral) Active. Bentyl (10MG Capsule, Oral) Active. Multivitamin Adults (Oral) Active. Xarelto (20MG Tablet, Oral) Active. Medications Reconciled  Social History Illene Hall, CMA; 12/31/2017 3:39 PM) Alcohol use Occasional alcohol use. Caffeine use Carbonated beverages, Coffee, Tea. Illicit drug use Remotely quit drug use. Tobacco use Former smoker.  Family History Illene Hall, [place]; 12/31/2017 3:39 PM) Alcohol Abuse Daughter. Arthritis Father, Sister. Depression Mother. Diabetes Mellitus Father. Hypertension Father. Melanoma Father, Mother.  Pregnancy / Birth History Illene Hall, CMA; 12/31/2017 3:39 PM) Age at menarche 83 years. Age of menopause 3-50 Gravida 2 Maternal age 29-20 Para 2  Other Problems Illene Hall, CMA; 12/31/2017 3:39 PM) Arthritis Back Pain Cervical Cancer Gastroesophageal Reflux Disease Hemorrhoids Oophorectomy Right. Transfusion history     Review of Systems Joanna Hall CMA; 12/31/2017 3:39 PM) General Present- Chills, Fatigue, Night Sweats and Weight Loss. Not Present- Appetite Loss, Fever and Weight Gain. Skin Not Present- Change in Wart/Mole, Dryness, Hives, Jaundice, New Lesions, Non-Healing Wounds, Rash and Ulcer. HEENT Not Present- Earache, Hearing Loss, Hoarseness, Nose Bleed, Oral Ulcers, Ringing in the Ears, Seasonal Allergies, Sinus Pain, Sore Throat, Visual Disturbances, Wears glasses/contact lenses and Yellow Eyes. Respiratory Present- Snoring. Not  Present- Bloody sputum, Chronic Cough, Difficulty Breathing and Wheezing. Breast Not Present- Breast Mass, Breast Pain, Nipple Discharge and Skin Changes. Cardiovascular Present- Swelling of  Extremities. Not Present- Chest Pain, Difficulty Breathing Lying Down, Leg Cramps, Palpitations, Rapid Heart Rate and Shortness of Breath. Gastrointestinal Present- Abdominal Pain, Chronic diarrhea, Excessive gas, Hemorrhoids, Indigestion and Rectal Pain. Not Present- Bloating, Bloody Stool, Change in Bowel Habits, Constipation, Difficulty Swallowing, Gets full quickly at meals, Nausea and Vomiting. Female Genitourinary Present- Nocturia and Urgency. Not Present- Frequency, Painful Urination and Pelvic Pain. Musculoskeletal Present- Back Pain, Joint Pain, Joint Stiffness and Swelling of Extremities. Not Present- Muscle Pain and Muscle Weakness. Neurological Not Present- Decreased Memory, Fainting, Headaches, Numbness, Seizures, Tingling, Tremor, Trouble walking and Weakness. Psychiatric Not Present- Anxiety, Bipolar, Change in Sleep Pattern, Depression, Fearful and Frequent crying. Endocrine Present- Hot flashes. Not Present- Cold Intolerance, Excessive Hunger, Hair Changes, Heat Intolerance and New Diabetes. Hematology Present- Blood Thinners and Easy Bruising. Not Present- Excessive bleeding, Gland problems, HIV and Persistent Infections.  Vitals (Alisha Hall CMA; 12/31/2017 3:40 PM) 12/31/2017 3:40 PM Weight: 159 lb Height: 69.5in Body Surface Area: 1.88 m Body Mass Index: 23.14 kg/m  Pulse: 68 (Regular)  BP: 110/60 (Sitting, Left Arm, Standard)      Physical Exam Adin Hector MD; 12/31/2017 4:00 PM)  General Mental Status-Alert. General Appearance-Not in acute distress, Not Sickly. Orientation-Oriented X3. Hydration-Well hydrated. Voice-Normal.  Integumentary Global Assessment Upon inspection and palpation of skin surfaces of the - Axillae: non-tender, no  inflammation or ulceration, no drainage. and Distribution of scalp and body hair is normal. General Characteristics Temperature - normal warmth is noted.  Head and Neck Head-normocephalic, atraumatic with no lesions or palpable masses. Face Global Assessment - atraumatic, no absence of expression. Neck Global Assessment - no abnormal movements, no bruit auscultated on the right, no bruit auscultated on the left, no decreased range of motion, non-tender. Trachea-midline. Thyroid Gland Characteristics - non-tender.  Eye Eyeball - Left-Extraocular movements intact, No Nystagmus. Eyeball - Right-Extraocular movements intact, No Nystagmus. Cornea - Left-No Hazy. Cornea - Right-No Hazy. Sclera/Conjunctiva - Left-No scleral icterus, No Discharge. Sclera/Conjunctiva - Right-No scleral icterus, No Discharge. Pupil - Left-Direct reaction to light normal. Pupil - Right-Direct reaction to light normal.  ENMT Ears Pinna - Left - no drainage observed, no generalized tenderness observed. Right - no drainage observed, no generalized tenderness observed. Nose and Sinuses External Inspection of the Nose - no destructive lesion observed. Inspection of the nares - Left - quiet respiration. Right - quiet respiration. Mouth and Throat Lips - Upper Lip - no fissures observed, no pallor noted. Lower Lip - no fissures observed, no pallor noted. Nasopharynx - no discharge present. Oral Cavity/Oropharynx - Tongue - no dryness observed. Oral Mucosa - no cyanosis observed. Hypopharynx - no evidence of airway distress observed.  Chest and Lung Exam Inspection Movements - Normal and Symmetrical. Accessory muscles - No use of accessory muscles in breathing. Palpation Palpation of the chest reveals - Non-tender. Auscultation Breath sounds - Normal and Clear.  Cardiovascular Auscultation Rhythm - Regular. Murmurs & Other Heart Sounds - Auscultation of the heart reveals - No Murmurs and No  Systolic Clicks.  Abdomen Inspection Inspection of the abdomen reveals - No Visible peristalsis and No Abnormal pulsations. Umbilicus - No Bleeding, No Urine drainage. Palpation/Percussion Palpation and Percussion of the abdomen reveal - Soft, Non Tender, No Rebound tenderness, No Rigidity (guarding) and No Cutaneous hyperesthesia. Note: Abdomen soft. Not severely distended. No distasis recti. No umbilical or other anterior abdominal wall hernias  Female Genitourinary Sexual Maturity Tanner 5 - Adult hair pattern. Note: a few warts on the left inferior  labia. No vaginal bleeding nor discharge  Rectal Note: ` ` ` carpeting of 2-5 millimeter warts perianally and going up into anal canal. Sensitive. Probably about 30.  no pilonidal disease. No definite fissure. No abscess. Normal sphincter tone. no obvious distal rectal masses. Held off on anoscopy examination  Peripheral Vascular Upper Extremity Inspection - Left - No Cyanotic nailbeds, Not Ischemic. Right - No Cyanotic nailbeds, Not Ischemic.  Neurologic Neurologic evaluation reveals -normal attention span and ability to concentrate, able to name objects and repeat phrases. Appropriate fund of knowledge , normal sensation and normal coordination. Mental Status Affect - not angry, not paranoid. Cranial Nerves-Normal Bilaterally. Gait-Normal.  Neuropsychiatric Mental status exam performed with findings of-able to articulate well with normal speech/language, rate, volume and coherence, thought content normal with ability to perform basic computations and apply abstract reasoning and no evidence of hallucinations, delusions, obsessions or homicidal/suicidal ideation.  Musculoskeletal Global Assessment Spine, Ribs and Pelvis - no instability, subluxation or laxity. Right Upper Extremity - no instability, subluxation or laxity.  Lymphatic Head & Neck  General Head & Neck Lymphatics: Bilateral - Description - No  Localized lymphadenopathy. Axillary  General Axillary Region: Bilateral - Description - No Localized lymphadenopathy. Femoral & Inguinal  Generalized Femoral & Inguinal Lymphatics: Left - Description - No Localized lymphadenopathy. Right - Description - No Localized lymphadenopathy.    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 Too much to try and ablate in the office. Skeptical of topical agents being successful.  I recommended outpatient surgical ablation. Try to do laser ablation. Would do lithotomy so that vaginal warts can be treated as well.  Current Plans You are being scheduled for surgery- Our schedulers will call you.  You should hear from our office's scheduling department within 5 working days about the location, date, and time of surgery. We try to make accommodations for patient's preferences in scheduling surgery, but sometimes the OR schedule or the surgeon's schedule prevents [place] from making those accommodations.  If you have not heard from our office [phone]) in 5 working days, call the office and ask for your surgeon's nurse.  If you have other questions about your diagnosis, plan, or surgery, call the office and ask for your surgeon's nurse.  Pt Education - [facility] Anal Warts (Hazelee Harbold) Pt Education - [facility] Rectal Prep for Anorectal outpatient/office surgery: discussed with patient and provided information. Pt Education - [facility] Rectal Surgery HCI (Chara Marquard): discussed with patient and provided information. The anatomy & physiology of the anorectal region was discussed. The pathophysiology of anorectal warts and differential diagnosis was discussed. Natural history risks without surgery was discussed such as further growth and cancer. I stressed the importance of office follow-up to catch early recurrence & minimize/halt  progression of disease. Interventions such as cauterization or cryotherapy by topical agents were discussed.  The patient's symptoms are not adequately controlled by non-operative treatments. I feel the risks & problems of no surgery outweigh the operative risks; therefore, I recommended surgery to treat the anal warts by removal, ablation and/or cauterization.  Risks such as bleeding, infection, need for further treatment, heart attack, death, and other risks were discussed. I noted a good likelihood this will help address the problem. Goals of post-operative recovery were discussed as well. Possibility that this will not correct all symptoms was explained. Post-operative pain, bleeding, constipation, and other problems after surgery  were discussed. We will work to minimize complications. Educational handouts further explaining the pathology, treatment options, and bowel regimen were given as well. Questions were answered. The patient expresses understanding & wishes to proceed with surgery.   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 We'll do lithotomy  Adin Hector, M.D., F.A.C.S. Gastrointestinal and Minimally Invasive Surgery [facility] [address], [place], [place] [address] [phone] Main / Paging

## 2018-01-03 ENCOUNTER — Ambulatory Visit: Payer: Medicaid Other | Admitting: Physical Therapy

## 2018-01-10 ENCOUNTER — Ambulatory Visit: Payer: Medicaid Other | Admitting: Physical Therapy

## 2018-01-15 ENCOUNTER — Ambulatory Visit: Payer: Medicaid Other | Admitting: Physical Therapy

## 2018-01-15 ENCOUNTER — Ambulatory Visit: Payer: Medicaid Other | Admitting: Family Medicine

## 2018-01-15 ENCOUNTER — Ambulatory Visit: Payer: Self-pay | Admitting: Family Medicine

## 2018-01-15 ENCOUNTER — Encounter: Payer: Self-pay | Admitting: Family Medicine

## 2018-01-15 VITALS — BP 110/70 | HR 82 | Temp 97.6°F | Resp 16 | Ht 67.0 in | Wt 169.7 lb

## 2018-01-15 DIAGNOSIS — A630 Anogenital (venereal) warts: Secondary | ICD-10-CM | POA: Insufficient documentation

## 2018-01-15 DIAGNOSIS — R799 Abnormal finding of blood chemistry, unspecified: Secondary | ICD-10-CM | POA: Diagnosis not present

## 2018-01-15 DIAGNOSIS — I700 Atherosclerosis of aorta: Secondary | ICD-10-CM

## 2018-01-15 DIAGNOSIS — R718 Other abnormality of red blood cells: Secondary | ICD-10-CM | POA: Insufficient documentation

## 2018-01-15 DIAGNOSIS — G8929 Other chronic pain: Secondary | ICD-10-CM

## 2018-01-15 DIAGNOSIS — M25512 Pain in left shoulder: Secondary | ICD-10-CM

## 2018-01-15 DIAGNOSIS — D649 Anemia, unspecified: Secondary | ICD-10-CM

## 2018-01-15 DIAGNOSIS — B182 Chronic viral hepatitis C: Secondary | ICD-10-CM

## 2018-01-15 DIAGNOSIS — M25511 Pain in right shoulder: Secondary | ICD-10-CM | POA: Diagnosis not present

## 2018-01-15 DIAGNOSIS — Z1239 Encounter for other screening for malignant neoplasm of breast: Secondary | ICD-10-CM

## 2018-01-15 DIAGNOSIS — C539 Malignant neoplasm of cervix uteri, unspecified: Secondary | ICD-10-CM

## 2018-01-15 DIAGNOSIS — M25562 Pain in left knee: Secondary | ICD-10-CM

## 2018-01-15 DIAGNOSIS — M25561 Pain in right knee: Secondary | ICD-10-CM | POA: Diagnosis not present

## 2018-01-15 DIAGNOSIS — M25551 Pain in right hip: Secondary | ICD-10-CM | POA: Diagnosis not present

## 2018-01-15 DIAGNOSIS — I10 Essential (primary) hypertension: Secondary | ICD-10-CM | POA: Diagnosis not present

## 2018-01-15 DIAGNOSIS — M7122 Synovial cyst of popliteal space [Baker], left knee: Secondary | ICD-10-CM

## 2018-01-15 DIAGNOSIS — E559 Vitamin D deficiency, unspecified: Secondary | ICD-10-CM

## 2018-01-15 DIAGNOSIS — R768 Other specified abnormal immunological findings in serum: Secondary | ICD-10-CM | POA: Diagnosis not present

## 2018-01-15 DIAGNOSIS — R945 Abnormal results of liver function studies: Secondary | ICD-10-CM

## 2018-01-15 DIAGNOSIS — E538 Deficiency of other specified B group vitamins: Secondary | ICD-10-CM

## 2018-01-15 DIAGNOSIS — R7989 Other specified abnormal findings of blood chemistry: Secondary | ICD-10-CM

## 2018-01-15 DIAGNOSIS — Z1231 Encounter for screening mammogram for malignant neoplasm of breast: Secondary | ICD-10-CM

## 2018-01-15 DIAGNOSIS — R197 Diarrhea, unspecified: Secondary | ICD-10-CM

## 2018-01-15 MED ORDER — B12 FOLATE 800-800 MCG PO CAPS: 1.0000 | ORAL_CAPSULE | Freq: Every day | ORAL | 2 refills | Status: DC

## 2018-01-15 NOTE — Assessment & Plan Note (Signed)
Taking 50k vitamin D once a week

## 2018-01-15 NOTE — Assessment & Plan Note (Signed)
Well controlled 

## 2018-01-15 NOTE — Assessment & Plan Note (Signed)
On the cholestyramine; trying low FODMAP diet, seeing GI

## 2018-01-15 NOTE — Assessment & Plan Note (Signed)
Little large; less than 7 alcoholic drinks per week, start folic acid, continue Q00

## 2018-01-15 NOTE — Progress Notes (Signed)
BP 110/70   Pulse 82   Temp 97.6 F (36.4 C) (Oral)   Resp 16   Ht 5' 7"  (1.702 m)   Wt 169 lb 11.2 oz (77 kg)   SpO2 98%   BMI 26.58 kg/m    Subjective:    Patient ID: Joanna Hall, female    DOB: 1966/04/22, 52 y.o.   MRN: [id number]  HPI: Joanna Hall is a 52 y.o. female  Chief Complaint  Patient presents with  . New Patient (Initial Visit)  . Establish Care    HPI Patient is here to establish care She has had some sort of clot in the uterus; they found that here with scan in February 2019, while in hospital for colon and intestinal surgery Hepatitis C, going to see the gastroenterologist, Dr. Marius Ditch, just ran another test  Cancer treatment with radiation, cervical cancer; they went in to do a radical hysterectomy, but so much scar tissue; this was at [facility] in [place]; the ovarian cyst busted and all the infection went, had to do chemo and radiation; had blood transfusions, platelets were low, allergic reaction to the platelets Seeing oncologist, Dr. Mike Gip; has another CT coming up in May; they are watching nodules in the lungs, maybe spread from cancer, but not sure No fam hx of female reproductive cancers; lung nodules are growing just a little, "not alarming" and they are watching Small bowel obstruction, from previous radiation, wrapped around intestines, removed some small bowel and colon; a lot of trouble using the bathroom Had EGD and colonoscopy Using the cholestyramine powder; 3x a day is constipating, 2x a day is not quite enough; trying low FODMAP diet, doesn't matter Hard time sleeping at night; hot flashes, not sleeping very well at night; going on for a year Aortic athero; not often eating fatty meats Low B12 and on shots; taking vitamin D also; drinks alcohol Arthritis in neck and shoulder; barely walking Ortho is seeing her for the knee pain; baker's cyst on the back of the left knee; he drained it and it came right back after 2  days  Depression screen [facility] 2/9 01/15/2018  Decreased Interest 0  Down, Depressed, Hopeless 1  PHQ - 2 Score 1    Relevant past medical, surgical, family and social history reviewed Past Medical History:  Diagnosis Date  . Anemia   . Anxiety   . Arthritis    neck and knees  . 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 Blood transfusion without reported diagnosis   . Cancer ([place])    cervix  . GERD (gastroesophageal reflux disease)   . Heart murmur   . Hepatitis C   . History of cancer chemotherapy    completed 06/2017  . Hypertension   . 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 Stiff neck    limited right turn   Past Surgical History:  Procedure Laterality Date  . CHOLECYSTECTOMY    . COLON SURGERY  08/2017   resection  . COLONOSCOPY WITH PROPOFOL N/A 12/20/2017   Procedure: COLONOSCOPY WITH PROPOFOL;  Surgeon: [name], MD;  Location: [facility] ENDOSCOPY;  Service: Gastroenterology;  Laterality: N/A;  . ESOPHAGOGASTRODUODENOSCOPY (EGD) WITH PROPOFOL N/A 12/20/2017   Procedure: ESOPHAGOGASTRODUODENOSCOPY (EGD) WITH PROPOFOL;  Surgeon: [name], MD;  Location: [facility] ENDOSCOPY;  Service: Gastroenterology;  Laterality: N/A;  . LAPAROTOMY N/A 08/31/2017   Procedure: EXPLORATORY LAPAROTOMY for SBO, ileocolectomy, removal of piece of uterine wall;  Surgeon: [name], MD;  Location: [facility] ORS;  Service: General;  Laterality: N/A;  . OOPHORECTOMY    . SMALL INTESTINE SURGERY    . THORACOTOMY     Family History  Problem Relation Age of Onset  . Hypertension Father   . Diabetes Father   . Alcohol abuse Daughter   . Hypertension Maternal Grandmother   . Diabetes Maternal Grandmother   . Diabetes Paternal Grandmother   . Hypertension Paternal Grandmother    Social History   Tobacco Use  . Smoking status: Former Smoker    Last attempt to quit: 10/16/2006    Years since quitting: 11.2  . Smokeless tobacco: Never Used  Substance Use Topics  .  Alcohol use: Yes    Frequency: Never    Comment: may have 1 drink/month. none last 24hrs  . Drug use: No    Interim medical history since last visit reviewed. Allergies and medications reviewed  Review of Systems Per HPI unless specifically indicated above     Objective:    BP 110/70   Pulse 82   Temp 97.6 F (36.4 C) (Oral)   Resp 16   Ht 5' 7"  (1.702 m)   Wt 169 lb 11.2 oz (77 kg)   SpO2 98%   BMI 26.58 kg/m   Wt Readings from Last 3 Encounters:  01/15/18 169 lb 11.2 oz (77 kg)  12/20/17 158 lb (71.7 kg)  12/12/17 156 lb 6.4 oz (70.9 kg)    Physical Exam  Constitutional: She appears well-developed and well-nourished. No distress.  HENT:  Head: Normocephalic and atraumatic.  Eyes: EOM are normal. No scleral icterus.  Neck: No thyromegaly present.  Cardiovascular: Normal rate, regular rhythm and normal heart sounds.  No murmur heard. Pulmonary/Chest: Effort normal and breath sounds normal. No respiratory distress. She has no wheezes.  Abdominal: Soft. Bowel sounds are normal. She exhibits no distension.  Musculoskeletal: Normal range of motion. She exhibits no edema.  Neurological: She is alert. She exhibits normal muscle tone.  Skin: Skin is warm and dry. She is not diaphoretic. No pallor.  Psychiatric: She has a normal mood and affect. Her behavior is normal. Judgment and thought content normal.    Results for orders placed or performed during the hospital encounter of 12/27/17  Miscellaneous [facility] test (send-out)  Result Value Ref Range   [facility] test code [id number]    [facility] test name HCV    Misc [facility] result COMMENT       Assessment & Plan:   Problem List Items Addressed This Visit      Cardiovascular and Mediastinum   Essential hypertension (Chronic)    Well-controlled      Aortic atherosclerosis ([place]) - Primary (Chronic)    Quit smoking; discussed athero with model      Relevant Orders   Lipid panel     Digestive   Chronic hepatitis C  without hepatic coma (HCC) (Chronic)    Seeing Dr. Marius Ditch        Musculoskeletal and Integument   Genital warts    With fissures causing pain; awaiting surgery schedule        Other   Vitamin D deficiency    Taking 50k vitamin D once a week      Elevated MCV    Little large; less than 7 alcoholic drinks per week, start folic acid, continue S28      Elevated LFTs    Related to hepatitis C      Diarrhea  On the cholestyramine; trying low FODMAP diet, seeing GI      Cervical cancer, FIGO stage IB1 ([place])    Managed by Dr. Mike Gip      B12 deficiency    Continue the B12, sister gives them      Anemia   Relevant Medications   Cobalamine Combinations (B12 FOLATE) 800-800 MCG CAPS    Other Visit Diagnoses    Screening for breast cancer       Relevant Orders   MM Digital Screening   Positive ANA (antinuclear antibody)       Relevant Orders   Ambulatory referral to Rheumatology   Abnormal blood chemistry test       Relevant Orders   Ambulatory referral to Rheumatology   Chronic pain of both knees       Relevant Orders   Ambulatory referral to Rheumatology   Pain of right hip joint       Relevant Orders   Ambulatory referral to Rheumatology   Chronic pain of both shoulders       Relevant Orders   Ambulatory referral to Rheumatology   Baker cyst, left           Follow up plan: Return in about 6 weeks (around 02/26/2018) for follow-up visit with Dr. Sanda Klein.  An after-visit summary was printed and given to the patient at [place].  Please see the patient instructions which may contain other information and recommendations beyond what is mentioned above in the assessment and plan.  Meds ordered this encounter  Medications  . Cobalamine Combinations (B12 FOLATE) 800-800 MCG CAPS    Sig: Take 1 capsule by mouth daily.    Dispense:  30 capsule    Refill:  2    Orders Placed This Encounter  Procedures  . MM Digital Screening  . Lipid panel  . Ambulatory  referral to Rheumatology

## 2018-01-15 NOTE — Assessment & Plan Note (Signed)
Quit smoking; discussed athero with model

## 2018-01-15 NOTE — Assessment & Plan Note (Signed)
Continue the B12, sister gives them

## 2018-01-15 NOTE — Assessment & Plan Note (Signed)
Seeing Dr. Marius Ditch

## 2018-01-15 NOTE — Assessment & Plan Note (Signed)
Managed by Dr. Mike Gip

## 2018-01-15 NOTE — Assessment & Plan Note (Signed)
Related to hepatitis C

## 2018-01-15 NOTE — Assessment & Plan Note (Signed)
With fissures causing pain; awaiting surgery schedule

## 2018-01-18 ENCOUNTER — Encounter: Payer: Self-pay | Admitting: Hematology and Oncology

## 2018-01-21 ENCOUNTER — Encounter: Payer: Self-pay | Admitting: Urgent Care

## 2018-01-30 ENCOUNTER — Encounter: Payer: Self-pay | Admitting: Physical Therapy

## 2018-01-30 ENCOUNTER — Ambulatory Visit: Payer: Medicaid Other | Attending: Orthopedic Surgery | Admitting: Physical Therapy

## 2018-01-30 DIAGNOSIS — G8929 Other chronic pain: Secondary | ICD-10-CM | POA: Diagnosis present

## 2018-01-30 DIAGNOSIS — M25511 Pain in right shoulder: Secondary | ICD-10-CM | POA: Insufficient documentation

## 2018-01-30 NOTE — Therapy (Signed)
[place] [facility] [address] [place], [address] Phone: [phone]   Fax:  [phone]  Physical Therapy Evaluation  Patient Details  Name: Joanna Hall MRN: [id number] Date of Birth: [birth year] Referring Provider: [name], MD   Encounter Date: 01/30/2018  PT End of Session - 01/30/18 1650    Visit Number  1    Number of Visits  13    Date for PT Re-Evaluation  03/13/18    PT Start Time  0100    PT Stop Time  0200    PT Time Calculation (min)  60 min    Activity Tolerance  Patient tolerated treatment well    Behavior During Therapy  [facility] for tasks assessed/performed       Past Medical History:  Diagnosis Date  . Anemia   . Anxiety   . Arthritis    neck and knees  . 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 Blood transfusion without reported diagnosis   . Cancer ([place])    cervix  . GERD (gastroesophageal reflux disease)   . Heart murmur   . Hepatitis C   . History of cancer chemotherapy    completed 06/2017  . Hypertension   . Infarction of kidney ([place]) left kidney  . Stiff neck    limited right turn    Past Surgical History:  Procedure Laterality Date  . CHOLECYSTECTOMY    . COLON SURGERY  08/2017   resection  . COLONOSCOPY WITH PROPOFOL N/A 12/20/2017   Procedure: COLONOSCOPY WITH PROPOFOL;  Surgeon: [name], MD;  Location: [facility] ENDOSCOPY;  Service: Gastroenterology;  Laterality: N/A;  . ESOPHAGOGASTRODUODENOSCOPY (EGD) WITH PROPOFOL N/A 12/20/2017   Procedure: ESOPHAGOGASTRODUODENOSCOPY (EGD) WITH PROPOFOL;  Surgeon: [name], MD;  Location: [facility] ENDOSCOPY;  Service: Gastroenterology;  Laterality: N/A;  . LAPAROTOMY N/A 08/31/2017   Procedure: EXPLORATORY LAPAROTOMY for SBO, ileocolectomy, removal of piece of uterine wall;  Surgeon: [name], MD;  Location: [facility] ORS;  Service: General;  Laterality: N/A;  . OOPHORECTOMY    . SMALL INTESTINE SURGERY     . THORACOTOMY      There were no vitals filed for this visit.   Subjective Assessment - 01/30/18 1307    Subjective  R shoulder arthritis    Pertinent History  Patient is a 52 year old female with hx of cervical cancer (finishing chemotherapy and cancer free since Sept 2019), blot clotting in brain, kidneys, and lungs Nov 2019, and bowel obstruction requiring sx Nov 2019. Patient is seen today for R should pain that she reports has been going on "for years" but has been exacerbated over the past year. Patient reports she had a cortisone injection 3/12 which she reports little pain relief from. Patient reports her pain in in both shoulders at this time. Patient is scheduled for MRI if conservative treatment fails. Patient reports she is having pain with all shoulder motions at this time and reports her pain is worse in the am, and that she is unable to sleep d/t "tossing and turning" to find a comfortable position. Pt reports worst pain 5/10 and best 0/10 in the past week.     Limitations  House hold activities;Lifting    How long can you sit comfortably?  unlimited    How long can you stand comfortably?  pain with all standing d/t bilat knee arthritis    How long can you walk comfortably?  pain with all walking d/t bilat knee arthritis    Diagnostic tests  Xrays: mild cervical arthritis; MRI scheduled    Patient Stated Goals  decrease pain    Currently in Pain?  Yes    Pain Score  3     Pain Location  Shoulder    Pain Orientation  Right;Lateral    Pain Descriptors / Indicators  Aching;Sharp;Pins and needles;Dull    Pain Radiating Towards  Patient reports pain radiates to R side of the neck and down the elbow and into the hand    Pain Onset  More than a month ago    Pain Frequency  Constant    Aggravating Factors   All shoulder movements    Pain Relieving Factors  Short term relief from injections    Effect of Pain on Daily Activities  Unable to walk/swim, unable to complete household  chores         [facility] PT Assessment - 01/30/18 0001      Assessment   Medical Diagnosis  Cervical arthritis with R shoulder pain    Referring Provider  [name], MD    Onset Date/Surgical Date  01/31/15    Hand Dominance  Right    Next MD Visit  02/13/18    Prior Therapy  No       Precautions   Precautions  None      Balance Screen   Has the patient fallen in the past 6 months  No    Has the patient had a decrease in activity level because of a fear of falling?   No    Is the patient reluctant to leave their home because of a fear of falling?   No      Home Environment   Living Environment  -- Stairs inside home w/ handrail    Additional Comments  Lives w/ parents      Prior Function   Level of Independence  Independent    Vocation  Unemployed    Leisure  -- Household;       Editor, commissioning  Appears Intact         AROM Shoulder flex wnl bilat  Shoulder ext wnl bilat with R ant shoulder pain Shoulder abd wnl bilat with pain at lateral R and L shoulders Shoulder ER wnl bilat w/ R IR causing pain at R lateral shoulder Shoulder IR L- inf border of R scapula  R- to L5 with pain in "elbow joint"  Cervical flex wnl with "pulling" at R UT and pain at C7 Cervical ext 25% limited with pain at C7 Cervical rotation wnl with pain at C& Cervical lateral bending wnl w/ no pain  Cervical retraction wnl  Cervical protraction wnl with "pulling" sensation at R UT  PROM Shoulder flex wnl Shoulder ext wnl Shoulder abd wnl with pain at end range patient reports feels like it is "in the joint Shoulder IR  R 70d Shoulder ER  R 101d   Strength Shoulder flex L 4-/5  R 3+/5 w/ pain Shoulder ext 5/5 bilat with "pulling" sensation at neck Shoulder abd L 4+/5 R 4-/5 with pain at lateral shoulder Shoulder IR L 4-/5 with "pulling" at L UT R 3+/5 with pain lateral shoulder Shoulder ER L 3+/5 R 3+/5 with pain at R UT Grip Strength: 5/5   Special Tests/Other UE Reflexes (-)  Spurlings (+) Painful Arc on R at 90d; (-) on L  (+) Empty can bilat (+) ER  lag sign bilat (+) Lift off test L; unable on R  Posture Pt sits with forward head rounded shoulder posture w/ excessive thoracic kyphosis  Palpation: mild TTP at lateral R shoulder musculature. Mod TTP (w/ facial grimace) and  R UT and levator scapulae; min TTP at L UT and levator   Ther-Ex -Standing shoulder IR and ER 3x 10 each (HEP) -Levator stretch 30sec hold (HEP) -UT stretch 30sec hold (HEP) -Doorway pec stretch (HEP)              Objective measurements completed on examination: See above findings.              PT Education - 01/30/18 1325    Education provided  Yes    Education Details  Patient was educated on diagnosis, anatomy and pathology involved, prognosis, role of PT, and was given an HEP, demonstrating exercise with proper form following verbal and tactile cues, and was given a paper hand out to continue exercise at home. Pt was educated on and agreed to plan of care.    Methods  Explanation;Tactile cues;Verbal cues;Handout;Demonstration    Comprehension  Verbalized understanding;Returned demonstration;Tactile cues required;Verbal cues required       PT Short Term Goals - 01/30/18 1725      PT SHORT TERM GOAL #1   Title  Pt will be independent with HEP in order to improve strength and ROM to improve function at home and work.    Time  2    Period  Weeks    Status  New        PT Long Term Goals - 01/30/18 1726      PT LONG TERM GOAL #1   Title  Patient will demonstrate symmetrical shoulder and cervical AROM wnl in order to complete self care ADL's     Baseline  4/17 see eval    Time  6    Period  Weeks    Status  New      PT LONG TERM GOAL #2   Title  Patient will increase FOTO score to 65 to demonstrate predicted increase in functional mobility to complete ADLs    Baseline  4/17 50    Time  6    Period  Weeks    Status  New      PT LONG TERM GOAL #3    Title  Patient will demonstrate bilat gross shoulder strength of 4/5 in order to safely complete heavy housework    Baseline  4/17 see eval    Time  6    Period  Weeks    Status  New             Plan - 01/30/18 1708    Clinical Impression Statement  Pt is a 52 year old female with chronic R shoulder pain secondary to cervical arthritis dx that has progressively worsened over the past year with current activity limitations in lifting, reaching, carrying/grasping items, self-care ADLs e.g. dressing, grooming, bathing with UE. Impairments including R shoulder and cervical pain, decreased A/PROM, decreased strength, and soft tissue restrictions (esp R UT and levator). Participation restrictions: patient unable to perform home-maker activities and engage in regular exercise. Pt will benefit from skilled PT intervention to address the aforementioned impairments and activity limitations for best return to PLOF.     History and Personal Factors relevant to plan of care:  HTN, heart murmur, Hep C, anxiety, cervical arthritis, hx of blood clots, hx of cancer    Clinical Presentation  Evolving    Clinical Presentation due to:  2 personal factors/comorbidities, 3 body systems/activity limitations/participation restrictions     Clinical Decision Making  Moderate    Rehab Potential  Good    Clinical Impairments Affecting Rehab Potential  (+) fairly young age, motivation (-) chronicity of pain, sedentary lifesyle, multiple other comorbidites    PT Frequency  2x / week    PT Duration  6 weeks    PT Treatment/Interventions  Dry needling;Passive range of motion;Manual techniques;Neuromuscular re-education;Iontophoresis 67m/ml Dexamethasone;Moist Heat;Functional mobility training;Therapeutic activities;Patient/family education;Taping;Therapeutic exercise;Electrical Stimulation;Cryotherapy;Traction    PT Next Visit Plan  HEP and goal review; manual techniques for cervical musculature tension, RTC  strengthening    PT Home Exercise Plan  UT stretch, pec doorway stretch, levator stretch, yellow tband IR/ER    Consulted and Agree with Plan of Care  Patient       Patient will benefit from skilled therapeutic intervention in order to improve the following deficits and impairments:  Increased fascial restricitons, Pain, Improper body mechanics, Increased muscle spasms, Impaired tone, Postural dysfunction, Decreased mobility, Decreased range of motion, Decreased strength, Decreased activity tolerance, Impaired UE functional use, Impaired flexibility  Visit Diagnosis: Chronic right shoulder pain     Problem List Patient Active Problem List   Diagnosis Date Noted  . Aortic atherosclerosis ([place] 01/15/2018  . Elevated MCV 01/15/2018  . Anemia 01/15/2018  . Genital warts 01/15/2018  . Vitamin D deficiency 01/15/2018  . Pernicious anemia   . Rectal bleeding   . B12 deficiency 12/11/2017  . Lung nodules 12/11/2017  . Malnutrition of moderate degree 12/04/2017  . Abdominal pain 12/03/2017  . History of Clostridium difficile infection 10/23/2017  . Factor V Leiden ([place] 10/19/2017  . Goals of care, counseling/discussion 10/19/2017  . Abnormal cervical Papanicolaou smear 09/18/2017  . Cervical cancer, FIGO stage IB1 ([place] 09/18/2017  . Chronic hepatitis C without hepatic coma ([place] 09/18/2017  . Cytopenia 09/18/2017  . Diarrhea 09/18/2017  . Elevated LFTs 09/18/2017  . Erosive gastropathy 09/18/2017  . Hyponatremia 09/18/2017  . Intestinal infection due to Clostridium difficile 09/18/2017  . Lower abdominal pain 09/18/2017  . 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 Malignant neoplasm of overlapping sites of cervix ([place] 09/18/2017  . Pelvic inflammatory disease (PID) 09/18/2017  . Renal insufficiency 09/18/2017  . Wound infection after surgery 09/12/2017  . Hypokalemia   . Hypomagnesemia   . Ileocolic anastomotic leak   . Small bowel obstruction ([place] 08/27/2017  . Cervical  arthritis 07/18/2017  . Dysuria 06/20/2017  . Metastatic cancer ([place] 05/12/2017  . Hepatitis, chronic ([place] 05/05/2017  . Essential hypertension 03/15/2017  . Anemia in other chronic diseases classified elsewhere 03/01/2017  . Chemotherapy-induced neutropenia ([place] 01/29/2017  . 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[place] [place] 09/25/2016   CShelton SilvasPT, DPT CShelton Silvas4/17/2019, 5:31 PM  [facility] [address] [place] [address]Phone: [phone]  Fax:  [phone] Name: Joanna KRATTMRN: [id number] of Birth: 1Aug 27, 1967

## 2018-01-30 NOTE — Patient Instructions (Addendum)
  IR and ER with yellow tband 10x 3x/day

## 2018-02-04 DIAGNOSIS — R768 Other specified abnormal immunological findings in serum: Secondary | ICD-10-CM | POA: Insufficient documentation

## 2018-02-04 DIAGNOSIS — R7689 Other specified abnormal immunological findings in serum: Secondary | ICD-10-CM | POA: Insufficient documentation

## 2018-02-05 ENCOUNTER — Ambulatory Visit: Payer: Medicaid Other | Admitting: Physical Therapy

## 2018-02-07 ENCOUNTER — Ambulatory Visit: Payer: Medicaid Other | Admitting: Physical Therapy

## 2018-02-07 ENCOUNTER — Encounter: Payer: Self-pay | Admitting: Physical Therapy

## 2018-02-07 DIAGNOSIS — G8929 Other chronic pain: Secondary | ICD-10-CM

## 2018-02-07 DIAGNOSIS — M25511 Pain in right shoulder: Secondary | ICD-10-CM | POA: Diagnosis not present

## 2018-02-07 NOTE — Therapy (Signed)
[place] [facility] [address] [place], [address] Phone: [phone]   Fax:  [phone]  Physical Therapy Treatment  Patient Details  Name: Joanna Hall MRN: [id number] Date of Birth: 1965/10/30 Referring Provider: [name], MD   Encounter Date: 02/07/2018  PT End of Session - 02/07/18 1036    Visit Number  2    Number of Visits  13    Date for PT Re-Evaluation  03/13/18    PT Start Time  1030    PT Stop Time  1115    PT Time Calculation (min)  45 min    Activity Tolerance  Patient tolerated treatment well    Behavior During Therapy  [facility] for tasks assessed/performed       Past Medical History:  Diagnosis Date  . Anemia   . Anxiety   . Arthritis    neck and knees  . 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 Blood transfusion without reported diagnosis   . Cancer ([place])    cervix  . GERD (gastroesophageal reflux disease)   . Heart murmur   . Hepatitis C   . History of cancer chemotherapy    completed 06/2017  . Hypertension   . Infarction of kidney ([place]) left kidney  . Stiff neck    limited right turn    Past Surgical History:  Procedure Laterality Date  . CHOLECYSTECTOMY    . COLON SURGERY  08/2017   resection  . COLONOSCOPY WITH PROPOFOL N/A 12/20/2017   Procedure: COLONOSCOPY WITH PROPOFOL;  Surgeon: [name], MD;  Location: [facility] ENDOSCOPY;  Service: Gastroenterology;  Laterality: N/A;  . ESOPHAGOGASTRODUODENOSCOPY (EGD) WITH PROPOFOL N/A 12/20/2017   Procedure: ESOPHAGOGASTRODUODENOSCOPY (EGD) WITH PROPOFOL;  Surgeon: [name], MD;  Location: [facility] ENDOSCOPY;  Service: Gastroenterology;  Laterality: N/A;  . LAPAROTOMY N/A 08/31/2017   Procedure: EXPLORATORY LAPAROTOMY for SBO, ileocolectomy, removal of piece of uterine wall;  Surgeon: [name], MD;  Location: [facility] ORS;  Service: General;  Laterality: N/A;  . OOPHORECTOMY    . SMALL INTESTINE SURGERY     . THORACOTOMY      There were no vitals filed for this visit.  Subjective Assessment - 02/07/18 1034    Subjective  Patient reports she is having 7/10 pain in the R shoulder today, and reports it is difficult to drive. Patient reports she was unable to sleep on her R shoulder last night. Patient reports she is having increased tightness in the R UT area as well. Reports compliance with HEP.     Pertinent History  Patient is a 52 year old female with hx of cervical cancer (finishing chemotherapy and cancer free since Sept 2019), blot clotting in brain, kidneys, and lungs Nov 2019, and bowel obstruction requiring sx Nov 2019. Patient is seen today for R should pain that she reports has been going on "for years" but has been exacerbated over the past year. Patient reports she had a cortisone injection 3/12 which she reports little pain relief from. Patient reports her pain in in both shoulders at this time. Patient is scheduled for MRI if conservative treatment fails. Patient reports she is having pain with all shoulder motions at this time and reports her pain is worse in the am, and that she is unable to sleep d/t "tossing and turning" to find a comfortable position. Pt reports worst pain 5/10 and best 0/10 in the past week.  Limitations  House hold activities;Lifting    How long can you sit comfortably?  unlimited    How long can you stand comfortably?  pain with all standing d/t bilat knee arthritis    How long can you walk comfortably?  pain with all walking d/t bilat knee arthritis    Diagnostic tests  Xrays: mild cervical arthritis; MRI scheduled    Patient Stated Goals  decrease pain    Pain Onset  More than a month ago         Manual -STM w/ trigger point release to R UT and levator (prolonged time spent here to relieve trigger points and decrease tissue tightness) -Cervical traction 10sec traction; 10sec release x 10 bouts -GHJ AP mobilization grade I-II 30sec bouts 8 bouts for pain  modulation   Ther-Ex -Standing ER w/ yellow tband 3x 10 w/ cuing for proper elbow placement (HEP review) -Standing IR w/ yellow 3x  10 w/ cuing for eccentric control (HEP review) -Standing rows w/ yellow tband 3x 10 with cuing for proper form and scapular retraction -Pec doorway stretch 2x 30sec hold (HEP review)  -UT stretch 3x 30sec each side (HEP review) -Levator stretch 3x 30sec each side (HEP review)                      PT Education - 02/07/18 1036    Education provided  Yes    Education Details  Exercise form    Person(s) Educated  Patient    Methods  Explanation;Demonstration;Verbal cues;Tactile cues    Comprehension  Returned demonstration;Verbalized understanding;Tactile cues required;Verbal cues required       PT Short Term Goals - 01/30/18 1725      PT SHORT TERM GOAL #1   Title  Pt will be independent with HEP in order to improve strength and ROM to improve function at home and work.    Time  2    Period  Weeks    Status  New        PT Long Term Goals - 01/30/18 1726      PT LONG TERM GOAL #1   Title  Patient will demonstrate symmetrical shoulder and cervical AROM wnl in order to complete self care ADL's     Baseline  4/17 see eval    Time  6    Period  Weeks    Status  New      PT LONG TERM GOAL #2   Title  Patient will increase FOTO score to 65 to demonstrate predicted increase in functional mobility to complete ADLs    Baseline  4/17 50    Time  6    Period  Weeks    Status  New      PT LONG TERM GOAL #3   Title  Patient will demonstrate bilat gross shoulder strength of 4/5 in order to safely complete heavy housework    Baseline  4/17 see eval    Time  6    Period  Weeks    Status  New            Plan - 02/07/18 1252    Clinical Impression Statement  Pt is having a lot of pain "all over" today because of her arthritis. Patient was able to tolerate low resistance exercise with min pain noted at bilat shoulders. At first,  patient was unable to complete standing rows d/t bilat shoulder pain, but after demo, tactile and verbal cuing from PT to  contract periscapular musculature for movement  patient was able to complete without pain. PT went over HEP exercises with patient to ensure proper form, and patient only required min cuing to correct form.     Rehab Potential  Good    Clinical Impairments Affecting Rehab Potential  (+) fairly young age, motivation (-) chronicity of pain, sedentary lifesyle, multiple other comorbidites    PT Frequency  2x / week    PT Duration  6 weeks    PT Treatment/Interventions  Dry needling;Passive range of motion;Manual techniques;Neuromuscular re-education;Iontophoresis 62m/ml Dexamethasone;Moist Heat;Functional mobility training;Therapeutic activities;Patient/family education;Taping;Therapeutic exercise;Electrical Stimulation;Cryotherapy;Traction    PT Next Visit Plan  HEP and goal review; manual techniques for cervical musculature tension, RTC strengthening    PT Home Exercise Plan  UT stretch, pec doorway stretch, levator stretch, yellow tband IR/ER    Consulted and Agree with Plan of Care  Patient       Patient will benefit from skilled therapeutic intervention in order to improve the following deficits and impairments:  Increased fascial restricitons, Pain, Improper body mechanics, Increased muscle spasms, Impaired tone, Postural dysfunction, Decreased mobility, Decreased range of motion, Decreased strength, Decreased activity tolerance, Impaired UE functional use, Impaired flexibility  Visit Diagnosis: Chronic right shoulder pain     Problem List Patient Active Problem List   Diagnosis Date Noted  . Aortic atherosclerosis ([place] 01/15/2018  . Elevated MCV 01/15/2018  . Anemia 01/15/2018  . Genital warts 01/15/2018  . Vitamin D deficiency 01/15/2018  . Pernicious anemia   . Rectal bleeding   . B12 deficiency 12/11/2017  . Lung nodules 12/11/2017  . Malnutrition of  moderate degree 12/04/2017  . Abdominal pain 12/03/2017  . History of Clostridium difficile infection 10/23/2017  . Factor V Leiden (HA[place] 10/19/2017  . Goals of care, counseling/discussion 10/19/2017  . Abnormal cervical Papanicolaou smear 09/18/2017  . Cervical cancer, FIGO stage IB1 (HMaced[place] 09/18/2017  . Chronic hepatitis C without hepatic coma (HWilliams[place] 09/18/2017  . Cytopenia 09/18/2017  . Diarrhea 09/18/2017  . Elevated LFTs 09/18/2017  . Erosive gastropathy 09/18/2017  . Hyponatremia 09/18/2017  . Intestinal infection due to Clostridium difficile 09/18/2017  . Lower abdominal pain 09/18/2017  . 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 Malignant neoplasm of overlapping sites of cervix ([place]New [place] 09/18/2017  . Pelvic inflammatory disease (PID) 09/18/2017  . Renal insufficiency 09/18/2017  . Wound infection after surgery 09/12/2017  . Hypokalemia   . Hypomagnesemia   . Ileocolic anastomotic leak   . Small bowel obstruction (HWe[place] 08/27/2017  . Cervical arthritis 07/18/2017  . Dysuria 06/20/2017  . Metastatic cancer (HG[place] 05/12/2017  . Hepatitis, chronic (H[place] 05/05/2017  . Essential hypertension 03/15/2017  . Anemia in other chronic diseases classified elsewhere 03/01/2017  . Chemotherapy-induced neutropenia (HCoinj[place] 01/29/2017  . 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HGab[place] 09/25/2016   CShelton SilvasPT, DPT CShelton Silvas4/25/2019, 2:46 PM  [facility] [address] [place] [address]Phone: [phone]  Fax:  [phone] Name: KANNAJULIA LEWINGMRN: [id number] of Birth: 101-20-1967

## 2018-02-11 ENCOUNTER — Encounter: Payer: Self-pay | Admitting: Gastroenterology

## 2018-02-11 ENCOUNTER — Other Ambulatory Visit
Admission: RE | Admit: 2018-02-11 | Discharge: 2018-02-11 | Disposition: A | Discharge status: Home / Self Care | Payer: Medicaid Other | Source: Ambulatory Visit | Attending: Gastroenterology | Admitting: Gastroenterology

## 2018-02-11 ENCOUNTER — Ambulatory Visit: Payer: Medicaid Other | Admitting: Family Medicine

## 2018-02-11 ENCOUNTER — Ambulatory Visit: Payer: Medicaid Other | Admitting: Gastroenterology

## 2018-02-11 ENCOUNTER — Encounter: Payer: Self-pay | Admitting: Family Medicine

## 2018-02-11 ENCOUNTER — Other Ambulatory Visit: Payer: Self-pay

## 2018-02-11 VITALS — BP 136/82 | HR 91 | Temp 98.2°F | Resp 16 | Ht 67.0 in | Wt 169.4 lb

## 2018-02-11 VITALS — BP 122/83 | HR 66 | Ht 68.4 in | Wt 169.8 lb

## 2018-02-11 DIAGNOSIS — Z87891 Personal history of nicotine dependence: Secondary | ICD-10-CM | POA: Diagnosis not present

## 2018-02-11 DIAGNOSIS — R058 Other specified cough: Secondary | ICD-10-CM

## 2018-02-11 DIAGNOSIS — D518 Other vitamin B12 deficiency anemias: Secondary | ICD-10-CM

## 2018-02-11 DIAGNOSIS — Z1231 Encounter for screening mammogram for malignant neoplasm of breast: Secondary | ICD-10-CM | POA: Diagnosis present

## 2018-02-11 DIAGNOSIS — R05 Cough: Secondary | ICD-10-CM

## 2018-02-11 DIAGNOSIS — Z23 Encounter for immunization: Secondary | ICD-10-CM | POA: Diagnosis not present

## 2018-02-11 DIAGNOSIS — R1030 Lower abdominal pain, unspecified: Secondary | ICD-10-CM | POA: Diagnosis not present

## 2018-02-11 DIAGNOSIS — Z1239 Encounter for other screening for malignant neoplasm of breast: Secondary | ICD-10-CM

## 2018-02-11 DIAGNOSIS — K9089 Other intestinal malabsorption: Secondary | ICD-10-CM | POA: Diagnosis not present

## 2018-02-11 DIAGNOSIS — I700 Atherosclerosis of aorta: Secondary | ICD-10-CM | POA: Diagnosis present

## 2018-02-11 DIAGNOSIS — Z8701 Personal history of pneumonia (recurrent): Secondary | ICD-10-CM | POA: Diagnosis not present

## 2018-02-11 DIAGNOSIS — A630 Anogenital (venereal) warts: Secondary | ICD-10-CM

## 2018-02-11 DIAGNOSIS — R0981 Nasal congestion: Secondary | ICD-10-CM | POA: Diagnosis not present

## 2018-02-11 DIAGNOSIS — C539 Malignant neoplasm of cervix uteri, unspecified: Secondary | ICD-10-CM

## 2018-02-11 LAB — LIPID PANEL
Cholesterol: 149 mg/dL (ref 0–200)
HDL: 68 mg/dL (ref >40)
LDL CALC: 28 mg/dL (ref 0–99)
Total CHOL/HDL Ratio: 2.2 RATIO
Triglycerides: 266 mg/dL — ABNORMAL HIGH (ref <150)
VLDL: 53 mg/dL — AB (ref 0–40)

## 2018-02-11 LAB — VITAMIN B12: Vitamin B-12: 319 pg/mL (ref 180–914)

## 2018-02-11 MED ORDER — LORATADINE 10 MG PO TABS
10.0000 mg | ORAL_TABLET | Freq: Every day | ORAL | 11 refills | Status: DC
Start: 2018-02-11 — End: 2018-04-02

## 2018-02-11 MED ORDER — CHOLESTYRAMINE 4 G PO PACK: 4.0000 g | PACK | Freq: Three times a day (TID) | ORAL | 0 refills | Status: DC

## 2018-02-11 MED ORDER — DICYCLOMINE HCL 10 MG PO CAPS: 10.0000 mg | ORAL_CAPSULE | Freq: Three times a day (TID) | ORAL | 1 refills | Status: DC

## 2018-02-11 MED ORDER — FLUTICASONE PROPIONATE 50 MCG/ACT NA SUSP: 2.0000 | Freq: Every day | NASAL | 0 refills | Status: DC

## 2018-02-11 MED ORDER — AZITHROMYCIN 250 MG PO TABS: ORAL_TABLET | ORAL | 0 refills | Status: DC

## 2018-02-11 NOTE — Progress Notes (Signed)
[name], MD [address]  [place]  [place], [place] [address]  Main: [phone]  Fax: [phone]    Gastroenterology Consultation  Referring Provider:     No ref. provider found Primary Care Physician:  Arnetha Courser, MD Primary Gastroenterologist:  Dr. [name] Reason for Consultation:     Chronic diarrhea and lower abdominal pain        HPI:   Joanna Hall is a 52 y.o. female referred by Dr. Hampton Abbot for consultation & management of and chronic unexplained diarrhea. Patient has history of cervical cancer underwent chemotherapy XRT and brachytherapy, appears to be stable, closely followed by oncology at [facility]. She had small bowel obstruction in 08/2017, underwent exploratory laparoscopy with ileocolectomy and primary anastomosis. 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 Currently on Xarelto  Patient reports that since her bowel resection, she has been experiencing several episodes of nonbloody diarrhea, up to 12 times per day and night associated with urgency, nocturnal diarrhea as well as fecal incontinence. She does report diffuse lower abdominal cramps as well as intermittent gas/bloating.patient reports that she may have seen worms in her stools on several occasions. She does drink well water. She denies recent travel outside the [place] or recent use of antibiotics. She did have C. Difficile infection in the past when she was undergoing chemotherapy for cervical cancer. She had stool studies for C. Difficile and other GI pathogens including ova and parasites in 10/2017 by Dr. Hampton Abbot and they were negative.She has tried Imodium and Lomotil with no relief. Therefore she was referred to GI by Dr. Hampton Abbot for further management. Patient denies any particular relation to food. Prior to her surgery, patient reports having  alternating episodes of diarrhea and constipation associated with abdominal cramps and bloating. She would have up to 2-3 soft bowel movements daily but not this severe.her weight has been stable. She denies nausea, vomiting, fever, night sweats. She denies rectal bleeding. She is found to have macrocytic anemia which has been stable. She is not on any B12 or iron supplements.   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 She does not have any evidence of chronic liver disease.  Follow-up visit 12/12/2017 Patient was admitted to the hospital early this month secondary to lower abdominal pain, CT revealed mild rectosigmoid thickening. She reports that her diarrhea resolved after initiation of cholestyramine. She is currently taking 4 g twice daily. Having one bowel movement daily. She denies rectal bleeding. Her anal fissure had also significantly improved since control of her diarrhea. She has not received topical nitroglycerin ointment. However, she continues to have lower abdominal pain, constant with no relation to food or bowel movements. She tried dicyclomine which did not help. She is seeing Dr. Mike Gip for follow-up of lung nodules and is scheduled to have a CT scan in 02/2018. Her disease has not progressed to date. She also has B12 deficiency for which she is taking B12 injections. She stopped taking NSAIDs  Follow-up visit 02/11/2018 Patient was at her weight has been stable. She feels like she  is dependent on Bentyl and cholestyramine for her diarrhea and abdominal pain. Her diarrhea recurs if she does not take cholestyramine. She takes 2 packets daily which in fact leads to constipation. Bentyl  helps with her chronic lower abdominal cramps. Medication for hepatitis C treatment has been approved, and waiting for the medicine to be shipped. She underwent colonoscopy which showed anal condylomata.  She denies smoking or drinking alcohol.  NSAIDs: Has stopped taking NSAIDs  Antiplts/Anticoagulants/Anti thrombotics: on Xarelto for factor V bleeding mutation  GI Procedures: EGD 12/30/2017 - Normal duodenal bulb and second portion of the duodenum. - Erythematous mucosa in the stomach. Biopsied. - Normal gastroesophageal junction and esophagus.  Colonoscopy 12/30/2017 - Perianal condylomata found on perianal exam. - Patent end-to-side ileo-colonic anastomosis, characterized by healthy appearing mucosa. - The examined portion of the ileum was normal. Biopsied. - Multiple non-bleeding colonic angioectasias. - Normal mucosa in the entire examined colon. Biopsied. DIAGNOSIS:  A. STOMACH, ANTRUM; COLD BIOPSY:  - MILD CHRONIC GASTRITIS AND REGENERATIVE/REPARATIVE CHANGE.  - NEGATIVE FOR H. PYLORI, DYSPLASIA AND MALIGNANCY.   B. STOMACH, BODY; COLD BIOPSY:  - MINIMAL CHRONIC GASTRITIS AND REGENERATIVE/REPARATIVE CHANGE.  - NEGATIVE FOR H. PYLORI, DYSPLASIA AND MALIGNANCY.   Note: The histologic findings in the antrum and body resemble reactive  gastropathy. Etiologies include drugs/chemical injury (NSAIDs vs.  other), bile reflux, and changes adjacent to an area of healing  ulceration. Clinical correlation with endoscopic findings is required.   C. TERMINAL ILEUM; COLD BIOPSY:  - PARTIALLY FRAGMENTED SMALL BOWEL MUCOSA WITH OVERALL INTACT VILLOUS  ARCHITECTURE.  - NEGATIVE FOR INTRAEPITHELIAL LYMPHOCYTOSIS, DYSPLASIA AND MALIGNANCY.   D. RANDOM COLON; COLD BIOPSY:  - COLONIC MUCOSA NEGATIVE FOR MICROSCOPIC COLITIS, DYSPLASIA AND  MALIGNANCY.   Colonoscopy at [facility] 10/05/2016 Impression:           - One diminutive polyp in the cecum,                        removed with a  cold biopsy forceps.                        Resected and retrieved.                       - One 5 mm polyp in the cecum,                        removed with a jumbo cold forceps.                        Resected and retrieved.                       - One 6 mm polyp in the cecum,                        removed with a hot snare. Resected                        and retrieved.                       - One 5 mm polyp in the descending                        colon,  removed with a jumbo cold                        forceps. Resected and retrieved. Date Collected: 10/05/2016 14:48 Diagnosis A.COLON, LABELED AS "CECAL POLYP", BIOPSY:  TUBULAR ADENOMA  SESSILE SERRATED ADENOMA  B.COLON, LABELED AS "TRANSVERSE COLON POLYP", BIOPSY:  TUBULAR ADENOMA  She denies family history of celiac disease, autoimmune conditions, IBD, GI malignancy  Past Medical History:  Diagnosis Date  . Anemia   . Anxiety   . Arthritis    neck and knees  . 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 Blood transfusion without reported diagnosis   . Cancer ([place])    cervix  . GERD (gastroesophageal reflux disease)   . Heart murmur   . Hepatitis C   . History of cancer chemotherapy    completed 06/2017  . Hypertension   . Infarction of kidney ([place]) left kidney  . Stiff neck    limited right turn    Past Surgical History:  Procedure Laterality Date  . CHOLECYSTECTOMY    . COLON SURGERY  08/2017   resection  . COLONOSCOPY WITH PROPOFOL N/A 12/20/2017   Procedure: COLONOSCOPY WITH PROPOFOL;  Surgeon: [name], MD;  Location[facility] ENDOSCOPY;  Service: Gastroenterology;  Laterality: N/A;  . ESOPHAGOGASTRODUODENOSCOPY (EGD) WITH PROPOFOL N/A 12/20/2017   Procedure: ESOPHAGOGASTRODUODENOSCOPY (EGD) WITH PROPOFOL;  Surgeon: [name], MD;  Location: [facility] ENDOSCOPY;  Service: Gastroenterology;  Laterality: N/A;  . LAPAROTOMY N/A 08/31/2017   Procedure: EXPLORATORY LAPAROTOMY for SBO,  ileocolectomy, removal of piece of uterine wall;  Surgeon: [name], MD;  Location: [facility] ORS;  Service: General;  Laterality: N/A;  . OOPHORECTOMY    . SMALL INTESTINE SURGERY    . THORACOTOMY      Current Outpatient Medications:  .  acetaminophen (TYLENOL) 325 MG tablet, Take 650 mg every 6 (six) hours as needed by mouth., Disp: , Rfl:  .  Cobalamine Combinations (B12 FOLATE) 800-800 MCG CAPS, Take 1 capsule by mouth daily., Disp: 30 capsule, Rfl: 2 .  cyanocobalamin (,VITAMIN B-12,) 1000 MCG/ML injection, Inject 1ML every week x 4 weeks, then 1ML every other week x 2 months, then monthly x 3 months, Disp: 10 mL, Rfl: 1 .  HYDROcodone-acetaminophen (NORCO/VICODIN) 5-325 MG tablet, Take 1 tablet by mouth every 6 (six) hours as needed for severe pain., Disp: 15 tablet, Rfl: 0 .  hydrocortisone (ANUSOL-HC) 2.5 % rectal cream, Place 1 application rectally 2 (two) times daily., Disp: 30 g, Rfl: 0 .  loperamide (IMODIUM A-D) 2 MG tablet, Take 2 mg by mouth 4 (four) times daily as needed for diarrhea or loose stools., Disp: , Rfl:  .  Multiple Vitamin (MULTIVITAMIN WITH MINERALS) TABS tablet, Take 1 tablet daily by mouth., Disp: , Rfl:  .  rivaroxaban (XARELTO) 20 MG TABS tablet, Take 1 tablet (20 mg total) by mouth daily with supper., Disp: 30 tablet, Rfl: 1 .  Syringe/Needle, Disp, (SYRINGE 3CC/22GX1") 22G X 1" 3 ML MISC, For use with Vitamin B12 injections, Disp: 50 each, Rfl: 0 .  VENTOLIN HFA 108 (90 Base) MCG/ACT inhaler, INHALE 1-2 PUFFS BY MOUTH EVERY 4 HOURS AS NEEDED FOR WHEEZING / SHORTNESS OF BREATH, Disp: , Rfl: 0 .  Vitamin D, Ergocalciferol, (DRISDOL) 50000 units CAPS capsule, Take 1 capsule (50,000 Units total) by mouth every 7 (seven) days for 12 doses., Disp: 12 capsule, Rfl: 0 .  Vitamins/Minerals TABS,  Take by mouth., Disp: , Rfl:  .  cholestyramine (QUESTRAN) 4 g packet, Take 1 packet (4 g total) by mouth 3 (three) times daily., Disp: 270 packet, Rfl: 0 .  dicyclomine (BENTYL)  10 MG capsule, Take 1 capsule (10 mg total) by mouth 4 (four) times daily -  before meals and at bedtime., Disp: 360 capsule, Rfl: 1 .  sertraline (ZOLOFT) 50 MG tablet, Take 50 mg daily by mouth., Disp: , Rfl:    Family History  Problem Relation Age of Onset  . Hypertension Father   . Diabetes Father   . Alcohol abuse Daughter   . Hypertension Maternal Grandmother   . Diabetes Maternal Grandmother   . Diabetes Paternal Grandmother   . Hypertension Paternal Grandmother      Social History   Tobacco Use  . Smoking status: Former Smoker    Last attempt to quit: 10/16/2006    Years since quitting: 11.3  . Smokeless tobacco: Never Used  Substance Use Topics  . Alcohol use: Yes    Frequency: Never    Comment: may have 1 drink/month. none last 24hrs  . Drug use: No    Allergies as of 02/11/2018 - Review Complete 02/11/2018  Allergen Reaction Noted  . Ketamine Other (See Comments) and Anxiety 02/22/2017    Review of Systems:    All systems reviewed and negative except where noted in HPI.   Physical Exam:  BP 122/83 (BP Location: Left Arm, Patient Position: Sitting, Cuff Size: Normal)   Pulse 66   Ht 5' 8.4" (1.737 m)   Wt 169 lb 12.8 oz (77 kg)   BMI 25.52 kg/m  No LMP recorded. Patient is postmenopausal.  General:   Alert,  Well-developed, well-nourished, pleasant and cooperative in NAD Head:  Normocephalic and atraumatic. Eyes:  Sclera clear, no icterus.   Conjunctiva pink. Ears:  Normal auditory acuity. Nose:  No deformity, discharge, or lesions. Mouth:  No deformity or lesions,oropharynx pink & moist. Neck:  Supple; no masses or thyromegaly. Lungs:  Respirations even and unlabored.  Clear throughout to auscultation.   No wheezes, crackles, or rhonchi. No acute distress. Heart:  Regular rate and rhythm; no murmurs, clicks, rubs, or gallops. Abdomen:  Normal bowel sounds. Soft,mild diffuse lower abdominal tenderness, and non-distended without masses, hepatosplenomegaly  or hernias noted.  No guarding or rebound tenderness.   Rectal: Not performed Msk:  Symmetrical without gross deformities. Good, equal movement & strength bilaterally. Pulses:  Normal pulses noted. Extremities:  No clubbing or edema.  No cyanosis. Neurologic:  Alert and oriented x3;  grossly normal neurologically. Skin:  Intact without significant lesions or rashes. No jaundice. Lymph Nodes:  No significant cervical adenopathy. Psych:  Alert and cooperative. Normal mood and affect.  Imaging Studies: reviewed  Assessment and Plan:   Joanna Hall is a 52 y.o. female with history of chronic hepatitis C, treatment nave, cervical adenocarcinoma, status post chemotherapy XRT and brachytherapy, disease currently stable, persistent small bowel obstruction, S/P exploratory laparoscopy with ileocolectomy in 08/2017 seen in follow-up for chronic hepatitis C, chronic unexplained diarrhea, chronic anal fissure as well as symptomatic hemorrhoids  Chronic hep C: Genotype 2a/2c, VL 82,400  Start treatment of hepatitis C with Mavyret for 8 weeks There is no evidence of chronic liver disease She does have elevated transaminases and alkaline phosphatase She had alkaline phosphatase Isoenzymes and liver component is elevated, GGT 209, elevated Hepatitis B surface antigen negative HIV nonreactive Check hepatitis B surface antibody, core antibody egative Other  secondary causes for elevated LFTs are unremarkable  Chronic nonbloody diarrhea: Her history is consistent with bile salt induced diarrhea with history of ileocolectomy. 4g bid cholestyramine is causing constipation. Pancreatic fecal elastase, fecal calprotectin are normal, CRP came back normal. No evidence of microscopic colitis - decrease cholestyramine to 4 g once daily with meals to avoid constipation - Dicyclomine for abdominal cramps   - avoid NSAIDs  Bilateral lower abdominal pain: CT revealed mild thickening of the rectosigmoid  colon Her abdominal pain is most likely secondary to scar tissue - colonoscopy revealed mild radiation proctitis  Chronic anal fissure and symptomatic hemorrhoids: - Her fissure has significantly improved - I will defer outpatient hemorrhoid ligation for now given her ongoing lower abdominal pain  - she is undergoing removal of anal condylomata by colorectal surgeon at [facility]  B12 deficiency anemia: - Continue B12 injections as recommended - recheck vitamin B12 levels  - there is no evidence of autoimmune gastritis/pernicious anemia based on EGD - Negative antiparietal cell antibodies, intrinsic factor antibodies   Follow up in 3 months   [name], MD

## 2018-02-11 NOTE — Progress Notes (Signed)
Name: Joanna Hall   MRN: [id number]    DOB: [birth year]   Date:02/11/2018       Progress Note  Subjective  Chief Complaint  Chief Complaint  Patient presents with  . Cough    Onset-2 weeks, has a history of bacteria pneumonia-DayQuil and NyQuil coughing up green phlegm  . Nasal Congestion    Runny nose and eyes, cough, nasal congestion- little appetite    HPI  Cough/SOB: she used to smoke and currently has a diagnose of cervical cancer, possible lung mets. She states that over the past 2 weeks she has noticed SOB, cough initially not productive but now has green sputum, mild nocturnal wheezing She denies fever, but has chills ( chronic), poor appetite, also rhinorrhea, watery eyes, feeling tired. No previous history of allergies, but has noticed post-nasal drainage. She used to smoke, but quit in 2009. She is concerned because of history of CAP x 2. She has been taking Questran and diarrhea has been controlled, no rashes, no chest pain or palpitation.    Patient Active Problem List   Diagnosis Date Noted  . Positive ANA (antinuclear antibody) 02/04/2018  . Aortic atherosclerosis ([place]) 01/15/2018  . Elevated MCV 01/15/2018  . Anemia 01/15/2018  . Genital warts 01/15/2018  . Vitamin D deficiency 01/15/2018  . Pernicious anemia   . Rectal bleeding   . Impingement syndrome of shoulder region 12/19/2017  . Multiple joint pain 12/19/2017  . Osteoarthritis of right knee 12/19/2017  . B12 deficiency 12/11/2017  . Lung nodules 12/11/2017  . Malnutrition of moderate degree 12/04/2017  . Abdominal pain 12/03/2017  . History of Clostridium difficile infection 10/23/2017  . Factor V Leiden ([place]) 10/19/2017  . Goals of care, counseling/discussion 10/19/2017  . Abnormal cervical Papanicolaou smear 09/18/2017  . Cervical cancer, FIGO stage IB1 ([place]) 09/18/2017  . Chronic hepatitis C without hepatic coma ([place]) 09/18/2017  . Cytopenia 09/18/2017  . Diarrhea 09/18/2017  . Elevated LFTs  09/18/2017  . Erosive gastropathy 09/18/2017  . Hyponatremia 09/18/2017  . Intestinal infection due to Clostridium difficile 09/18/2017  . Lower abdominal pain 09/18/2017  . 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 Malignant neoplasm of overlapping sites of cervix ([place]) 09/18/2017  . Pelvic inflammatory disease (PID) 09/18/2017  . Renal insufficiency 09/18/2017  . Wound infection after surgery 09/12/2017  . Hypokalemia   . Hypomagnesemia   . Ileocolic anastomotic leak   . Small bowel obstruction ([place]) 08/27/2017  . Cervical arthritis 07/18/2017  . Dysuria 06/20/2017  . Metastatic cancer ([place]) 05/12/2017  . Hepatitis, chronic ([place]) 05/05/2017  . Essential hypertension 03/15/2017  . Anemia in other chronic diseases classified elsewhere 03/01/2017  . Chemotherapy-induced neutropenia ([place]) 01/29/2017  . 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 CHOLECYSTECTOMY    . COLON SURGERY  08/2017   resection  . COLONOSCOPY WITH PROPOFOL N/A 12/20/2017   Procedure: COLONOSCOPY WITH PROPOFOL;  Surgeon: [name], MD;  Location: [facility] ENDOSCOPY;  Service: Gastroenterology;  Laterality: N/A;  . ESOPHAGOGASTRODUODENOSCOPY (EGD) WITH PROPOFOL N/A 12/20/2017   Procedure: ESOPHAGOGASTRODUODENOSCOPY (EGD) WITH PROPOFOL;  Surgeon: [name], MD;  Location: [facility] ENDOSCOPY;  Service: Gastroenterology;  Laterality: N/A;  . LAPAROTOMY N/A 08/31/2017   Procedure: EXPLORATORY LAPAROTOMY for SBO, ileocolectomy, removal of piece of uterine wall;  Surgeon: [name], MD;  Location: [facility] ORS;  Service: General;  Laterality: N/A;  . OOPHORECTOMY    . SMALL INTESTINE SURGERY    .  THORACOTOMY      Family History  Problem Relation Age of Onset  . Hypertension Father   . Diabetes Father   . Alcohol abuse Daughter   . Hypertension Maternal Grandmother   . Diabetes Maternal Grandmother   . Diabetes Paternal Grandmother   .  Hypertension Paternal Grandmother     Social History   Socioeconomic History  . Marital status: Legally Separated    Spouse name: Not on file  . Number of children: Not on file  . Years of education: Not on file  . Highest education level: Not on file  Occupational History  . Not on file  Social Needs  . Financial resource strain: Not on file  . Food insecurity:    Worry: Not on file    Inability: Not on file  . Transportation needs:    Medical: Not on file    Non-medical: Not on file  Tobacco Use  . Smoking status: Former Smoker    Last attempt to quit: 10/16/2006    Years since quitting: 11.3  . Smokeless tobacco: Never Used  Substance and Sexual Activity  . Alcohol use: Yes    Frequency: Never    Comment: may have 1 drink/month. none last 24hrs  . Drug use: No  . Sexual activity: Not Currently    Comment: Not Asked  Lifestyle  . Physical activity:    Days per week: Not on file    Minutes per session: Not on file  . Stress: Not on file  Relationships  . Social connections:    Talks on phone: Not on file    Gets together: Not on file    Attends religious service: Not on file    Active member of club or organization: Not on file    Attends meetings of clubs or organizations: Not on file    Relationship status: Not on file  . Intimate partner violence:    Fear of current or ex partner: Not on file    Emotionally abused: Not on file    Physically abused: Not on file    Forced sexual activity: Not on file  Other Topics Concern  . Not on file  Social History Narrative  . Not on file     Current Outpatient Medications:  .  acetaminophen (TYLENOL) 325 MG tablet, Take 650 mg every 6 (six) hours as needed by mouth., Disp: , Rfl:  .  cholestyramine (QUESTRAN) 4 g packet, Take 1 packet (4 g total) by mouth 3 (three) times daily., Disp: 270 packet, Rfl: 0 .  Cobalamine Combinations (B12 FOLATE) 800-800 MCG CAPS, Take 1 capsule by mouth daily., Disp: 30 capsule, Rfl:  2 .  cyanocobalamin (,VITAMIN B-12,) 1000 MCG/ML injection, Inject 1ML every week x 4 weeks, then 1ML every other week x 2 months, then monthly x 3 months, Disp: 10 mL, Rfl: 1 .  dicyclomine (BENTYL) 10 MG capsule, Take 1 capsule (10 mg total) by mouth 4 (four) times daily -  before meals and at bedtime., Disp: 360 capsule, Rfl: 1 .  HYDROcodone-acetaminophen (NORCO/VICODIN) 5-325 MG tablet, Take 1 tablet by mouth every 6 (six) hours as needed for severe pain., Disp: 15 tablet, Rfl: 0 .  hydrocortisone (ANUSOL-HC) 2.5 % rectal cream, Place 1 application rectally 2 (two) times daily., Disp: 30 g, Rfl: 0 .  loperamide (IMODIUM A-D) 2 MG tablet, Take 2 mg by mouth 4 (four) times daily as needed for diarrhea or loose stools., Disp: , Rfl:  .  Multiple Vitamin (MULTIVITAMIN WITH MINERALS) TABS tablet, Take 1 tablet daily by mouth., Disp: , Rfl:  .  rivaroxaban (XARELTO) 20 MG TABS tablet, Take 1 tablet (20 mg total) by mouth daily with supper., Disp: 30 tablet, Rfl: 1 .  Syringe/Needle, Disp, (SYRINGE 3CC/22GX1") 22G X 1" 3 ML MISC, For use with Vitamin B12 injections, Disp: 50 each, Rfl: 0 .  VENTOLIN HFA 108 (90 Base) MCG/ACT inhaler, INHALE 1-2 PUFFS BY MOUTH EVERY 4 HOURS AS NEEDED FOR WHEEZING / SHORTNESS OF BREATH, Disp: , Rfl: 0 .  Vitamin D, Ergocalciferol, (DRISDOL) 50000 units CAPS capsule, Take 1 capsule (50,000 Units total) by mouth every 7 (seven) days for 12 doses., Disp: 12 capsule, Rfl: 0 .  Vitamins/Minerals TABS, Take by mouth., Disp: , Rfl:  .  sertraline (ZOLOFT) 50 MG tablet, Take 50 mg daily by mouth., Disp: , Rfl:   Allergies  Allergen Reactions  . Ketamine Other (See Comments) and Anxiety    Unknown but given during previous cancer treatment and passed out after getting med  Other reaction(s): Confusion     ROS  Ten systems reviewed and is negative except as mentioned in HPI   Objective  Vitals:   02/11/18 1554  BP: 136/82  Pulse: 91  Resp: 16  Temp: 98.2 F (36.8  C)  TempSrc: Oral  SpO2: 98%  Weight: 169 lb 6.4 oz (76.8 kg)  Height: 5' 7"  (1.702 m)    Body mass index is 26.53 kg/m.  Physical Exam  Constitutional: Patient appears well-developed and well-nourished. Overweight.  No distress.  HEENT: head atraumatic, normocephalic, pupils equal and reactive to light, boggy turbinates neck supple, throat within normal limits Cardiovascular: Normal rate, regular rhythm and normal heart sounds.  No murmur heard. No BLE edema. Pulmonary/Chest: Effort normal and breath sounds normal. No respiratory distress. Abdominal: Soft.  There is no tenderness. Psychiatric: Patient has a normal mood and affect. behavior is normal. Judgment and thought content normal.  Recent Results (from the past 2160 hour(s))  Hepatitis C Genotype     Status: None   Collection Time: 11/27/17  1:55 PM  Result Value Ref Range   Hepatitis C Genotype 2a/2c    Please Note (HCV): Comment     Comment: This test was developed and its performance characteristics determined by [facility].  It has not been cleared or approved by the U.S. Food and Drug Administration. The FDA has determined that such clearance or approval is not necessary. This test is used for clinical purposes.  It should not be regarded as investigational or for research.   C-reactive protein     Status: None   Collection Time: 11/27/17  1:55 PM  Result Value Ref Range   CRP 1.6 0.0 - 4.9 mg/L  Sedimentation rate     Status: Abnormal   Collection Time: 11/27/17  1:55 PM  Result Value Ref Range   Sed Rate 65 (H) 0 - 40 mm/hr  Iron and TIBC     Status: None   Collection Time: 11/27/17  1:55 PM  Result Value Ref Range   Total Iron Binding Capacity 334 250 - 450 ug/dL   UIBC 227 131 - 425 ug/dL   Iron 107 27 - 159 ug/dL   Iron Saturation 32 15 - 55 %  VITAMIN D 25 Hydroxy (Vit-D Deficiency, Fractures)     Status: Abnormal   Collection Time: 11/27/17  1:55 PM  Result Value Ref Range   Vit D, 25-Hydroxy 17.2  (L) 30.0 -  100.0 ng/mL    Comment: Vitamin D deficiency has been defined by the [place] practice guideline as a level of serum 25-OH vitamin D less than 20 ng/mL (1,2). The Endocrine Society went on to further define vitamin D insufficiency as a level between 21 and 29 ng/mL (2). 1. IOM (Institute of Medicine). 2010. Dietary reference    intakes for calcium and D. [place]: The    [employer]. 2. Holick MF, [place], Bischoff-Ferrari HA, et al.    Evaluation, treatment, and prevention of vitamin D    deficiency: an Endocrine Society clinical practice    guideline. JCEM. 2011 Jul; 96(7):1911-30.   Vitamin B12     Status: Abnormal   Collection Time: 11/27/17  1:55 PM  Result Value Ref Range   Vitamin B-12 165 (L) 232 - 1,245 pg/mL  Folate     Status: None   Collection Time: 11/27/17  1:55 PM  Result Value Ref Range   Folate 10.6 >3.0 ng/mL    Comment: A serum folate concentration of less than 3.1 ng/mL is considered to represent clinical deficiency.   Ferritin     Status: Abnormal   Collection Time: 11/27/17  1:55 PM  Result Value Ref Range   Ferritin 950 (H) 15 - 150 ng/mL  HCV RNA quant rflx ultra or genotyp     Status: None   Collection Time: 11/27/17  1:55 PM  Result Value Ref Range   HCV Quant Baseline 82,400 IU/mL   HCV log10 4.916 log10 IU/mL   Test Information Comment     Comment: The quantitative range of this assay is 15 IU/mL to 100 million IU/mL.   HCV Genotype Comment     Comment: To be performed on this specimen.  Hepatitis C Genotype     Status: None   Collection Time: 11/27/17  1:55 PM  Result Value Ref Range   Hepatitis C Genotype CANCELED     Comment: Duplicate procedure ordered.  Result canceled by the ancillary.   Pancreatic elastase, fecal     Status: None   Collection Time: 11/28/17 12:00 AM  Result Value Ref Range   Pancreatic Elastase, Fecal >500 >200 ug Elast./g    Comment:         Severe Pancreatic Insufficiency:          <100        Moderate Pancreatic Insufficiency:   100 - 200        Normal:                                   >200   Calprotectin, Fecal     Status: None   Collection Time: 11/28/17  7:30 AM  Result Value Ref Range   Calprotectin, Fecal <16 0 - 120 ug/g    Comment: Concentration     Interpretation   Follow-Up <16 - 50 ug/g     Normal           None >50 -120 ug/g     Borderline       Re-evaluate in 4-6 weeks     >120 ug/g     Abnormal         Repeat as clinically  indicated   Lipase, blood     Status: None   Collection Time: 12/03/17  9:41 AM  Result Value Ref Range   Lipase 34 11 - 51 U/L    Comment: Performed at [facility], [address] [place], [place] [address]  Comprehensive metabolic panel     Status: Abnormal   Collection Time: 12/03/17  9:41 AM  Result Value Ref Range   Sodium 134 (L) 135 - 145 mmol/L   Potassium 3.8 3.5 - 5.1 mmol/L   Chloride 102 101 - 111 mmol/L   CO2 22 22 - 32 mmol/L   Glucose, Bld 103 (H) 65 - 99 mg/dL   BUN 13 6 - 20 mg/dL   Creatinine, Ser 0.65 0.44 - 1.00 mg/dL   Calcium 9.8 8.9 - 10.3 mg/dL   Total Protein 9.3 (H) 6.5 - 8.1 g/dL   Albumin 4.5 3.5 - 5.0 g/dL   AST 42 (H) 15 - 41 U/L   ALT 45 14 - 54 U/L   Alkaline Phosphatase 202 (H) 38 - 126 U/L   Total Bilirubin 0.6 0.3 - 1.2 mg/dL   GFR calc non Af Amer >60 >60 mL/min   GFR calc Af Amer >60 >60 mL/min    Comment: (NOTE) The eGFR has been calculated using the CKD EPI equation. This calculation has not been validated in all clinical situations. eGFR's persistently <60 mL/min signify possible Chronic Kidney Disease.    Anion gap 10 5 - 15    Comment: Performed at [facility], [address] [place], [place] [address]  CBC     Status: Abnormal   Collection Time: 12/03/17  9:41 AM  Result Value Ref Range   WBC 4.1 3.6 - 11.0 K/uL   RBC 4.11 3.80 - 5.20 MIL/uL   Hemoglobin 14.2 12.0 -  16.0 g/dL   HCT 41.3 35.0 - 47.0 %   MCV 100.3 (H) 80.0 - 100.0 fL   MCH 34.6 (H) 26.0 - 34.0 pg   MCHC 34.5 32.0 - 36.0 g/dL   RDW 14.5 11.5 - 14.5 %   Platelets 221 150 - 440 K/uL    Comment: Performed at [facility], [address] [place], [place] [address]  Urinalysis, Complete w Microscopic     Status: Abnormal   Collection Time: 12/03/17  9:41 AM  Result Value Ref Range   Color, Urine YELLOW (A) YELLOW   APPearance CLOUDY (A) CLEAR   Specific Gravity, Urine 1.015 1.005 - 1.030   pH 6.0 5.0 - 8.0   Glucose, UA NEGATIVE NEGATIVE mg/dL   Hgb urine dipstick NEGATIVE NEGATIVE   Bilirubin Urine NEGATIVE NEGATIVE   Ketones, ur NEGATIVE NEGATIVE mg/dL   Protein, ur NEGATIVE NEGATIVE mg/dL   Nitrite NEGATIVE NEGATIVE   Leukocytes, UA TRACE (A) NEGATIVE   RBC / HPF 0-5 0 - 5 RBC/hpf   WBC, UA 0-5 0 - 5 WBC/hpf   Bacteria, UA NONE SEEN NONE SEEN   Squamous Epithelial / LPF NONE SEEN NONE SEEN   Hyaline Casts, UA PRESENT     Comment: Performed at [facility], [place], [place] [address]  Heparin level (unfractionated)     Status: None   Collection Time: 12/03/17  5:01 PM  Result Value Ref Range   Heparin Unfractionated 0.30 0.30 - 0.70 IU/mL    Comment:        IF HEPARIN RESULTS ARE BELOW EXPECTED VALUES, AND PATIENT DOSAGE HAS BEEN CONFIRMED, SUGGEST FOLLOW  UP TESTING OF ANTITHROMBIN III LEVELS. Performed at [facility], [address] [place], [place] [address]   APTT     Status: None   Collection Time: 12/03/17  5:01 PM  Result Value Ref Range   aPTT 28 24 - 36 seconds    Comment: Performed at [facility], [address] [place], [place] [address]  Protime-INR     Status: None   Collection Time: 12/03/17  5:01 PM  Result Value Ref Range   Prothrombin Time 13.6 11.4 - 15.2 seconds   INR 1.05     Comment: Performed at [facility], [address] [place], [place] [address]  APTT     Status:  Abnormal   Collection Time: 12/03/17 11:55 PM  Result Value Ref Range   aPTT 59 (H) 24 - 36 seconds    Comment:        IF BASELINE aPTT IS ELEVATED, SUGGEST PATIENT RISK ASSESSMENT BE USED TO DETERMINE APPROPRIATE ANTICOAGULANT THERAPY. Performed at [facility], [place], [place] [address]   Heparin level (unfractionated)     Status: Abnormal   Collection Time: 12/03/17 11:55 PM  Result Value Ref Range   Heparin Unfractionated 0.26 (L) 0.30 - 0.70 IU/mL    Comment:        IF HEPARIN RESULTS ARE BELOW EXPECTED VALUES, AND PATIENT DOSAGE HAS BEEN CONFIRMED, SUGGEST FOLLOW UP TESTING OF ANTITHROMBIN III LEVELS. Performed at [facility], [address] [place], [place] [address]   Basic metabolic panel     Status: None   Collection Time: 12/04/17  4:55 AM  Result Value Ref Range   Sodium 135 135 - 145 mmol/L   Potassium 3.9 3.5 - 5.1 mmol/L   Chloride 106 101 - 111 mmol/L   CO2 22 22 - 32 mmol/L   Glucose, Bld 83 65 - 99 mg/dL   BUN 6 6 - 20 mg/dL   Creatinine, Ser 0.53 0.44 - 1.00 mg/dL   Calcium 8.9 8.9 - 10.3 mg/dL   GFR calc non Af Amer >60 >60 mL/min   GFR calc Af Amer >60 >60 mL/min    Comment: (NOTE) The eGFR has been calculated using the CKD EPI equation. This calculation has not been validated in all clinical situations. eGFR's persistently <60 mL/min signify possible Chronic Kidney Disease.    Anion gap 7 5 - 15    Comment: Performed at [facility], [address] [place], [place] [address]  CBC     Status: Abnormal   Collection Time: 12/04/17  4:55 AM  Result Value Ref Range   WBC 2.4 (L) 3.6 - 11.0 K/uL   RBC 3.33 (L) 3.80 - 5.20 MIL/uL   Hemoglobin 11.7 (L) 12.0 - 16.0 g/dL   HCT 34.1 (L) 35.0 - 47.0 %   MCV 102.4 (H) 80.0 - 100.0 fL   MCH 35.1 (H) 26.0 - 34.0 pg   MCHC 34.2 32.0 - 36.0 g/dL   RDW 15.2 (H) 11.5 - 14.5 %   Platelets 168 150 - 440 K/uL    Comment: Performed at [facility], [address] [place], [place] [address]  Glucose, capillary     Status: None   Collection Time: 12/04/17  8:40 AM  Result Value Ref Range   Glucose-Capillary 99 65 - 99 mg/dL  APTT     Status: Abnormal   Collection Time: 12/04/17  9:33 AM  Result Value Ref Range   aPTT  73 (H) 24 - 36 seconds    Comment:        IF BASELINE aPTT IS ELEVATED, SUGGEST PATIENT RISK ASSESSMENT BE USED TO DETERMINE APPROPRIATE ANTICOAGULANT THERAPY. Performed at [facility], [address] [place], [place] [address]   APTT     Status: Abnormal   Collection Time: 12/04/17  3:34 PM  Result Value Ref Range   aPTT 84 (H) 24 - 36 seconds    Comment:        IF BASELINE aPTT IS ELEVATED, SUGGEST PATIENT RISK ASSESSMENT BE USED TO DETERMINE APPROPRIATE ANTICOAGULANT THERAPY. Performed at [facility], [address] [place], [place] [address]   CBC     Status: Abnormal   Collection Time: 12/05/17  6:48 AM  Result Value Ref Range   WBC 2.0 (L) 3.6 - 11.0 K/uL   RBC 3.30 (L) 3.80 - 5.20 MIL/uL   Hemoglobin 11.7 (L) 12.0 - 16.0 g/dL   HCT 33.7 (L) 35.0 - 47.0 %   MCV 102.0 (H) 80.0 - 100.0 fL   MCH 35.3 (H) 26.0 - 34.0 pg   MCHC 34.6 32.0 - 36.0 g/dL   RDW 14.5 11.5 - 14.5 %   Platelets 142 (L) 150 - 440 K/uL    Comment: Performed at [facility], [address] [place], [place] [address]  APTT     Status: Abnormal   Collection Time: 12/05/17  6:48 AM  Result Value Ref Range   aPTT 120 (H) 24 - 36 seconds    Comment:        IF BASELINE aPTT IS ELEVATED, SUGGEST PATIENT RISK ASSESSMENT BE USED TO DETERMINE APPROPRIATE ANTICOAGULANT THERAPY. Performed at [facility], [address], [place] [address]   Heparin level (unfractionated)     Status: None   Collection Time: 12/05/17  6:48 AM  Result Value Ref Range   Heparin Unfractionated 0.53 0.30 - 0.70 IU/mL    Comment:        IF HEPARIN RESULTS ARE BELOW EXPECTED VALUES, AND PATIENT DOSAGE HAS  BEEN CONFIRMED, SUGGEST FOLLOW UP TESTING OF ANTITHROMBIN III LEVELS. Performed at [facility], [address] [place], [place] [address]   Glucose, capillary     Status: None   Collection Time: 12/05/17  7:58 AM  Result Value Ref Range   Glucose-Capillary 83 65 - 99 mg/dL  Heparin level (unfractionated)     Status: None   Collection Time: 12/05/17  1:19 PM  Result Value Ref Range   Heparin Unfractionated 0.54 0.30 - 0.70 IU/mL    Comment:        IF HEPARIN RESULTS ARE BELOW EXPECTED VALUES, AND PATIENT DOSAGE HAS BEEN CONFIRMED, SUGGEST FOLLOW UP TESTING OF ANTITHROMBIN III LEVELS. Performed at [facility], [address] [place], [place] [address]   APTT     Status: Abnormal   Collection Time: 12/05/17  1:19 PM  Result Value Ref Range   aPTT 100 (H) 24 - 36 seconds    Comment:        IF BASELINE aPTT IS ELEVATED, SUGGEST PATIENT RISK ASSESSMENT BE USED TO DETERMINE APPROPRIATE ANTICOAGULANT THERAPY. Performed at [facility], [address] [place], [place] [address]   CBC     Status: Abnormal   Collection Time: 12/06/17  4:11 AM  Result Value Ref Range   WBC 2.4 (L) 3.6 - 11.0 K/uL   RBC 3.29 (L) 3.80 - 5.20 MIL/uL   Hemoglobin 11.6 (  L) 12.0 - 16.0 g/dL   HCT 33.2 (L) 35.0 - 47.0 %   MCV 100.8 (H) 80.0 - 100.0 fL   MCH 35.1 (H) 26.0 - 34.0 pg   MCHC 34.9 32.0 - 36.0 g/dL   RDW 14.5 11.5 - 14.5 %   Platelets 144 (L) 150 - 440 K/uL    Comment: Performed at [facility], [place], [place] [address]  Heparin level (unfractionated)     Status: None   Collection Time: 12/06/17  4:11 AM  Result Value Ref Range   Heparin Unfractionated 0.41 0.30 - 0.70 IU/mL    Comment:        IF HEPARIN RESULTS ARE BELOW EXPECTED VALUES, AND PATIENT DOSAGE HAS BEEN CONFIRMED, SUGGEST FOLLOW UP TESTING OF ANTITHROMBIN III LEVELS. Performed at [facility], [place], [place] [address]   Heparin  level (unfractionated)     Status: None   Collection Time: 12/07/17  7:01 AM  Result Value Ref Range   Heparin Unfractionated 0.40 0.30 - 0.70 IU/mL    Comment:        IF HEPARIN RESULTS ARE BELOW EXPECTED VALUES, AND PATIENT DOSAGE HAS BEEN CONFIRMED, SUGGEST FOLLOW UP TESTING OF ANTITHROMBIN III LEVELS. Performed at [facility], [address] [place], [place] [address]   CBC     Status: Abnormal   Collection Time: 12/07/17  7:01 AM  Result Value Ref Range   WBC 2.7 (L) 3.6 - 11.0 K/uL   RBC 3.37 (L) 3.80 - 5.20 MIL/uL   Hemoglobin 11.7 (L) 12.0 - 16.0 g/dL   HCT 34.1 (L) 35.0 - 47.0 %   MCV 101.1 (H) 80.0 - 100.0 fL   MCH 34.7 (H) 26.0 - 34.0 pg   MCHC 34.3 32.0 - 36.0 g/dL   RDW 14.3 11.5 - 14.5 %   Platelets 151 150 - 440 K/uL    Comment: Performed at [facility], [address] [place], [place] [address]  CBC     Status: Abnormal   Collection Time: 12/08/17  6:21 AM  Result Value Ref Range   WBC 2.6 (L) 3.6 - 11.0 K/uL   RBC 3.45 (L) 3.80 - 5.20 MIL/uL   Hemoglobin 12.1 12.0 - 16.0 g/dL   HCT 34.6 (L) 35.0 - 47.0 %   MCV 100.2 (H) 80.0 - 100.0 fL   MCH 35.0 (H) 26.0 - 34.0 pg   MCHC 34.9 32.0 - 36.0 g/dL   RDW 14.6 (H) 11.5 - 14.5 %   Platelets 137 (L) 150 - 440 K/uL    Comment: Performed at [facility], [place], [place] [address]  Heparin level (unfractionated)     Status: None   Collection Time: 12/08/17  6:21 AM  Result Value Ref Range   Heparin Unfractionated 0.30 0.30 - 0.70 IU/mL    Comment:        IF HEPARIN RESULTS ARE BELOW EXPECTED VALUES, AND PATIENT DOSAGE HAS BEEN CONFIRMED, SUGGEST FOLLOW UP TESTING OF ANTITHROMBIN III LEVELS. Performed at [facility], [address] [place], [place] [address]   Tissue transglutaminase, IgA     Status: None   Collection Time: 12/12/17  4:13 PM  Result Value Ref Range   Transglutaminase IgA <2 0 - 3 U/mL    Comment:  Negative        0 -  3                               Weak Positive   4 - 10                               Positive           >10  Tissue Transglutaminase (tTG) has been identified  as the endomysial antigen.  Studies have demonstr-  ated that endomysial IgA antibodies have over 99%  specificity for gluten sensitive enteropathy.   TSH     Status: None   Collection Time: 12/12/17  4:13 PM  Result Value Ref Range   TSH 2.460 0.450 - 4.500 uIU/mL  Hepatitis B surface antibody     Status: None   Collection Time: 12/12/17  4:13 PM  Result Value Ref Range   Hep B Surface Ab, Qual Non Reactive     Comment:               Non Reactive: Inconsistent with immunity,                             less than 10 mIU/mL               Reactive:     Consistent with immunity,                             greater than 9.9 mIU/mL   Mitochondrial antibodies     Status: None   Collection Time: 12/12/17  4:13 PM  Result Value Ref Range   Mitochondrial Ab <20.0 0.0 - 20.0 Units    Comment:                                 Negative    0.0 - 20.0                                 Equivocal  20.1 - 24.9                                 Positive         >24.9 Mitochondrial (M2) Antibodies are found in 90-96% of patients with primary biliary cirrhosis.   IgA     Status: None   Collection Time: 12/12/17  4:13 PM  Result Value Ref Range   IgA/Immunoglobulin A, Serum 258 87 - 352 mg/dL  Hepatitis B core antibody, total     Status: None   Collection Time: 12/12/17  4:13 PM  Result Value Ref Range   Hep B Core Total Ab Negative Negative  Hepatitis A antibody, total     Status: None   Collection Time: 12/12/17  4:13 PM  Result Value Ref Range   Hep A Total Ab Negative Negative  Uric acid     Status: None   Collection Time: 12/19/17  3:00 PM  Result Value Ref Range   Uric Acid, Serum 6.6 2.3 - 6.6 mg/dL    Comment: Performed at [facility], [place], [place] [address]  Antinuclear  Antibodies, IFA     Status: Abnormal   Collection Time: 12/19/17  3:00 PM  Result Value Ref Range   ANA Ab, IFA Positive (A)     Comment: (NOTE)                                     Negative   <1:80                                     Borderline  1:80                                     Positive   >1:80 Performed At: [facility] [place], [place] [id number] Rush Farmer MD [id number] Performed at [facility], [address] [place], [place] [address]   C-reactive protein     Status: None   Collection Time: 12/19/17  3:00 PM  Result Value Ref Range   CRP <0.8 <1.0 mg/dL    Comment: Performed at [facility], [place] [address] [place], [place] [address]  Antistreptolysin O titer     Status: Abnormal   Collection Time: 12/19/17  3:00 PM  Result Value Ref Range   ASO 235.0 (H) 0.0 - 200.0 IU/mL    Comment: (NOTE) Performed At: [facility] [place], [place] [id number] Rush Farmer MD [id number] Performed at [facility], [address] [place], [place] [address]   Rheumatoid factor     Status: None   Collection Time: 12/19/17  3:00 PM  Result Value Ref Range   Rhuematoid fact SerPl-aCnc 12.5 0.0 - 13.9 IU/mL    Comment: (NOTE) Performed At: [facility] [place], [place] [id number] Rush Farmer MD [id number] Performed at [facility], [address] [place], [place] [address]   FANA Staining Patterns     Status: None   Collection Time: 12/19/17  3:00 PM  Result Value Ref Range   Speckled Pattern 1:80    Note: Comment     Comment: (NOTE) A positive ANA result may occur in healthy individuals (low titer) or be associated with a variety of diseases.  See interpretation chart which is not all inclusive: Pattern      Antigen Detected  Suggested Disease Association -----------  ----------------  ----------------------------- Homogeneous  DNA(ds,ss),        SLE - High titers             Nucleosomes,             Histones          Drug-induced SLE -----------  ----------------  ----------------------------- Speckled     Sm, RNP, SCL-70,  SLE,MCTD,PSS (diffuse form),             SS-A/SS-B         Sjogrens -----------  ----------------  ----------------------------- Nucleolar    SCL-70, PM-1/SCL  High titers Scleroderma,                               PM/DM -----------  ----------------  ----------------------------- Centromere   Centromere  PSS (limited form) w/Crest                               syndrome variable -----------  ----------------  ----------------------------- Nuclear Dot  Sp100,p80-c oilin  Primary Biliary Cirrhosis -----------  ----------------  ----------------------------- Nuclear      GP210,            Primary Biliary Cirrhosis Membrane     lamin A,B,C -----------  ----------------  ----------------------------- Performed At: [facility] [place], [place] [id number] Rush Farmer MD [id number] Performed at [facility], [address] [place], [place] [address]   Pregnancy, urine POC     Status: Abnormal   Collection Time: 12/20/17  8:10 AM  Result Value Ref Range   Preg Test, Ur POSITIVE (A) NEGATIVE    Comment:        THE SENSITIVITY OF THIS METHODOLOGY IS >24 mIU/mL   Pregnancy, urine POC     Status: None   Collection Time: 12/20/17  8:31 AM  Result Value Ref Range   Preg Test, Ur NEGATIVE NEGATIVE    Comment:        THE SENSITIVITY OF THIS METHODOLOGY IS >24 mIU/mL   Pregnancy, urine POC     Status: None   Collection Time: 12/20/17  8:57 AM  Result Value Ref Range   Preg Test, Ur NEGATIVE NEGATIVE    Comment:        THE SENSITIVITY OF THIS METHODOLOGY IS >24 mIU/mL   Surgical pathology     Status: None   Collection Time: 12/20/17  9:10 AM  Result Value Ref Range   SURGICAL PATHOLOGY      Surgical Pathology CASE: [id number] PATIENT: [facility]  [facility] Surgical Pathology Report     SPECIMEN SUBMITTED: A. Stomach, antrum; cbx B. Stomach, body; cbx C. Terminal ileum; cbx D. Colon, random; cbx  CLINICAL HISTORY: Gastric erythema; radiation proctitis  PRE-OPERATIVE DIAGNOSIS: Screening colonoscopy, EGD  POST-OPERATIVE DIAGNOSIS: Gastric erythema; radiation proctitis     DIAGNOSIS: A.  STOMACH, ANTRUM; COLD BIOPSY: - MILD CHRONIC GASTRITIS AND REGENERATIVE/REPARATIVE CHANGE. - NEGATIVE FOR H. PYLORI, DYSPLASIA AND MALIGNANCY.  B.  STOMACH, BODY; COLD BIOPSY: - MINIMAL CHRONIC GASTRITIS AND REGENERATIVE/REPARATIVE CHANGE. - NEGATIVE FOR H. PYLORI, DYSPLASIA AND MALIGNANCY.  Note: The histologic findings in the antrum and body resemble reactive gastropathy.  Etiologies include drugs/chemical injury (NSAIDs vs. other), bile reflux, and changes adjacent to an area of healing ulceration. Clinical correlation with endoscopic findings is requir ed.  C.  TERMINAL ILEUM; COLD BIOPSY: - PARTIALLY FRAGMENTED SMALL BOWEL MUCOSA WITH OVERALL INTACT VILLOUS ARCHITECTURE. - NEGATIVE FOR INTRAEPITHELIAL LYMPHOCYTOSIS, DYSPLASIA AND MALIGNANCY.  D.  RANDOM COLON; COLD BIOPSY: - COLONIC MUCOSA NEGATIVE FOR MICROSCOPIC COLITIS, DYSPLASIA AND MALIGNANCY.   GROSS DESCRIPTION:  A. Labeled: C BX antrum of stomach  Tissue fragment(s): 3  Size: 0.3-0.4 cm  Description: in formalin, pink-tan fragments  Entirely submitted in 1 cassette(s).  B. Labeled: C BX body of stomach  Tissue fragment(s): 4  Size: 0.3-0.5 cm  Description: in formalin, tan fragments  Entirely submitted in 1 cassette(s).  C. Labeled: C BX terminal ileum  Tissue fragment(s): 2  Size: 0.15 and 0.5 cm  Description: in formalin, tan fragments  Entirely submitted in 1 cassette(s).  D. Labeled: C BX random colon  Tissue fragment(s): multiple  Size: 1.2 x 0.4 x 0.1 cm  Description:  in formalin, tan fragments  Entirely submitted  in  1  cassette(s).    Final Diagnosis performed by Delorse Lek, MD.  Electronically signed 12/21/2017 10:27:19AM    The electronic signature indicates that the named Attending Pathologist has evaluated the specimen  Technical component performed at [facility], [address], [place], [place] [address] Lab: [phone] Dir: Rush Farmer, MD, MMM  Professional component performed at [facility], [address], [place], [place] [address] Lab: [phone] Dir: Dellia Nims. Rubinas, MD    Comprehensive metabolic panel     Status: Abnormal   Collection Time: 12/21/17 10:44 AM  Result Value Ref Range   Sodium 137 135 - 145 mmol/L   Potassium 3.8 3.5 - 5.1 mmol/L   Chloride 105 101 - 111 mmol/L   CO2 24 22 - 32 mmol/L   Glucose, Bld 62 (L) 65 - 99 mg/dL   BUN 17 6 - 20 mg/dL   Creatinine, Ser 0.83 0.44 - 1.00 mg/dL   Calcium 9.0 8.9 - 10.3 mg/dL   Total Protein 8.1 6.5 - 8.1 g/dL   Albumin 3.9 3.5 - 5.0 g/dL   AST 46 (H) 15 - 41 U/L   ALT 87 (H) 14 - 54 U/L   Alkaline Phosphatase 167 (H) 38 - 126 U/L   Total Bilirubin 0.4 0.3 - 1.2 mg/dL   GFR calc non Af Amer >60 >60 mL/min   GFR calc Af Amer >60 >60 mL/min    Comment: (NOTE) The eGFR has been calculated using the CKD EPI equation. This calculation has not been validated in all clinical situations. eGFR's persistently <60 mL/min signify possible Chronic Kidney Disease.    Anion gap 8 5 - 15    Comment: Performed at [facility], [place], [place] [address]  Anti-parietal antibody     Status: None   Collection Time: 12/21/17 10:44 AM  Result Value Ref Range   Parietal Cell Antibody-IgG 4.7 0.0 - 20.0 Units    Comment: (NOTE)                                Negative    0.0 - 20.0                                Equivocal  20.1 - 24.9                                Positive         >24.9 Parietal Cell Antibodies are found in 90% of patients with pernicious anemia and 30% of first  degree relatives with pernicious anemia. Performed At: [facility] [place], [place] [id number] Rush Farmer MD [id number] Performed at [facility], [address] [place], [place] [address]   Intrinsic Factor Antibodies     Status: None   Collection Time: 12/21/17 10:44 AM  Result Value Ref Range   Intrinsic Factor 1.1 0.0 - 1.1 AU/mL    Comment: (NOTE) Performed At: [facility] [place], [place] [id number] Rush Farmer MD [id number] Performed at [facility], [address] [place],  [address]   CBC with Differential     Status: Abnormal   Collection Time: 12/21/17 10:44 AM  Result Value Ref Range   WBC 4.4 3.6 - 11.0 K/uL   RBC  3.44 (L) 3.80 - 5.20 MIL/uL   Hemoglobin 11.8 (L) 12.0 - 16.0 g/dL   HCT 35.3 35.0 - 47.0 %   MCV 102.8 (H) 80.0 - 100.0 fL   MCH 34.5 (H) 26.0 - 34.0 pg   MCHC 33.5 32.0 - 36.0 g/dL   RDW 13.9 11.5 - 14.5 %   Platelets 200 150 - 440 K/uL   Neutrophils Relative % 73 %   Neutro Abs 3.3 1.4 - 6.5 K/uL   Lymphocytes Relative 22 %   Lymphs Abs 1.0 1.0 - 3.6 K/uL   Monocytes Relative 4 %   Monocytes Absolute 0.2 0.2 - 0.9 K/uL   Eosinophils Relative 1 %   Eosinophils Absolute 0.0 0 - 0.7 K/uL   Basophils Relative 0 %   Basophils Absolute 0.0 0 - 0.1 K/uL    Comment: Performed at [facility], [address] [place], [place] [address]  Miscellaneous [facility] test (send-out)     Status: None   Collection Time: 12/27/17 10:52 AM  Result Value Ref Range   [facility] test code [id number]    [facility] test name HCV    Misc [facility] result COMMENT     Comment: (NOTE) Test Ordered: 242683 HCV FibroSure Fibrosis Score                 0.15                      BN     Reference Range: 0.00-0.21                             Fibrosis Stage                 Comment                   BN                     F0 - No fibrosis Necroinflammat Activity Score  0.49        [H ]           BN     Reference Range: 0.00-0.17                             Necroinflammat Activity Grade  A1-A2                     BN   Alpha 2-Macroglobulins, Qn     234              mg/dL    BN     Reference Range: 110-276                               Haptoglobin                    161              mg/dL    BN     Reference Range: 34-200                                Apolipoprotein A-1             202  mg/dL    BN     Reference Range: 116-209                               Bilirubin, Total               0.3              mg/dL    BN     Reference Range: 0.0-1.2                               GGT                            136         [H ] IU/L     BN     Ref erence Range: 0-60                                  ALT (SGPT) P5P                 96          [H ] IU/L     BN     Reference Range: 0-40                                  Interpretations:               Comment                   BN   Quantitative results of 6 biochemical tests are analyzed using a computational algorithm to provide a quantitative surrogate marker (0.0-1.0) for liver fibrosis (METAVIR F0-F4) and for necroinflammatory activity (METAVIR A0-A3). Fibrosis Scoring:              Comment                   BN        <0.21 = Stage F0 - No fibrosis 0.21 - 0.27 = Stage F0 - F1 0.27 - 0.31 = Stage F1 - Portal fibrosis 0.31 - 0.48 = Stage F1 - F2 0.48 - 0.58 = Stage F2 - Bridging fibrosis with few septa 0.58 - 0.72 = Stage F3 - Bridging fibrosis with many septa 0.72 - 0.74 = Stage F3 - F4      >0.74 = Stage F4 - Cirrhosis Necroinflamm Activity Scoring: Comment                   BN        <0.17 = Grade A0 - No Activity 0.17 - 0.29 = Grade A0 - A1 0.29 - 0.36 = Grad e A1 - Minimal activity 0.36 - 0.52 = Grade A1 - A2 0.52 - 0.60 = Grade A2 - Moderate activity 0.60 - 0.62 = Grade A2 - A3      >0.62 = Grade A3 - Severe activity Limitations:                   Comment                   BN   The negative predictive value  of  a Fibrotest score <0.31 (absence of clinically significant fibrosis) was 85% when compared to liver biopsy in 1,270 HCV infected patients with a 38% prevalence of significant liver fibrosis (F2, 3 or 4). The positive predictive value of a Fibro-test score >0.48 (F2, 3, 4) was 61% in that same patient cohort. HCV FibroSURE is not recommended in patients with Rosanna Randy Disease, acute hemolysis (e.g. HCV ribavirin therapy mediated hemolysis) acute hepa-titis of the liver, extra-hepatic cholestasis, transplant patients, and/or renal insufficiency patients.  Any of these clinical situations may lead to inaccurate quantitative predictions of fibrosis and necroinflammatory activity in the liver. Comment:                        Comment                   BN   This test was developed and its performance characteristics determined by [facility].  It has not been cleared or approved by the Food and Drug Administration.  The FDA has determined that such clearance or approval is not necessary. For questions regarding this report please contact customer service at [phone]. Performed At: [facility] [place], [place] [id number] Rush Farmer MD [id number] Performed at [facility], [address] [place], [place] [address]   Lipid panel     Status: Abnormal   Collection Time: 02/11/18  3:32 PM  Result Value Ref Range   Cholesterol 149 0 - 200 mg/dL   Triglycerides 266 (H) <150 mg/dL   HDL 68 >40 mg/dL   Total CHOL/HDL Ratio 2.2 RATIO   VLDL 53 (H) 0 - 40 mg/dL   LDL Cholesterol 28 0 - 99 mg/dL    Comment:        Total Cholesterol/HDL:CHD Risk Coronary Heart Disease Risk Table                     Men   Women  1/2 Average Risk   3.4   3.3  Average Risk       5.0   4.4  2 X Average Risk   9.6   7.1  3 X Average Risk  23.4   11.0        Use the calculated Patient Ratio above and the CHD Risk Table to determine the patient's CHD Risk.        ATP  III CLASSIFICATION (LDL):  <100     mg/dL   Optimal  100-129  mg/dL   Near or Above                    Optimal  130-159  mg/dL   Borderline  160-189  mg/dL   High  >190     mg/dL   Very High Performed at [facility], [place], [place] [address]       PHQ2/9: Depression screen [facility] 2/9 01/15/2018  Decreased Interest 0  Down, Depressed, Hopeless 1  PHQ - 2 Score 1     Fall Risk: Fall Risk  01/15/2018  Falls in the past year? No      Assessment & Plan  1. Productive cough  - azithromycin (ZITHROMAX) 250 MG tablet; Take as directed  Dispense: 6 tablet; Refill: 0 With some SOB, cough and history of pneumonia and possible lung mets ( getting evaluated)   2. History of pneumonia  - azithromycin (ZITHROMAX) 250 MG tablet; Take as directed  Dispense: 6 tablet;  Refill: 0  3. History of tobacco use   4. Nasal congestion  - loratadine (CLARITIN) 10 MG tablet; Take 1 tablet (10 mg total) by mouth daily.  Dispense: 30 tablet; Refill: 11 - fluticasone (FLONASE) 50 MCG/ACT nasal spray; Place 2 sprays into both nostrils daily.  Dispense: 16 g; Refill: 0   5. Cervical cancer, FIGO stage IB1 (HCC)  Under the care of Dr. Mike Gip, lung nodules  6. Need for vaccination for pneumococcus  - Pneumococcal polysaccharide vaccine 23-valent greater than or equal to 2yo subcutaneous/IM

## 2018-02-12 ENCOUNTER — Ambulatory Visit: Payer: Medicaid Other | Admitting: Physical Therapy

## 2018-02-14 ENCOUNTER — Telehealth: Payer: Self-pay | Admitting: Gastroenterology

## 2018-02-14 ENCOUNTER — Ambulatory Visit: Payer: Medicaid Other | Attending: Orthopedic Surgery | Admitting: Physical Therapy

## 2018-02-14 ENCOUNTER — Encounter: Payer: Self-pay | Admitting: Gastroenterology

## 2018-02-14 DIAGNOSIS — M25511 Pain in right shoulder: Secondary | ICD-10-CM | POA: Insufficient documentation

## 2018-02-14 DIAGNOSIS — G8929 Other chronic pain: Secondary | ICD-10-CM | POA: Insufficient documentation

## 2018-02-14 NOTE — Telephone Encounter (Signed)
Patient stated that she received a message from Dr Marius Ditch stating you will be calling her with some instructions. She said you could send them to [facility]

## 2018-02-15 ENCOUNTER — Encounter: Payer: Self-pay | Admitting: Gastroenterology

## 2018-02-15 ENCOUNTER — Encounter (HOSPITAL_BASED_OUTPATIENT_CLINIC_OR_DEPARTMENT_OTHER): Payer: Self-pay

## 2018-02-18 ENCOUNTER — Other Ambulatory Visit: Payer: Self-pay

## 2018-02-18 ENCOUNTER — Encounter: Payer: Self-pay | Admitting: Gastroenterology

## 2018-02-18 ENCOUNTER — Encounter (HOSPITAL_BASED_OUTPATIENT_CLINIC_OR_DEPARTMENT_OTHER): Payer: Self-pay

## 2018-02-18 NOTE — Progress Notes (Signed)
Spoke with: Joanna Hall NPO:  After Midnight, no gum, candy, or mints   Arrival time: 0900AM Labs: N/A AM medications: Ensure pre surgical drink,Loratadine, Flonase, Bring Asthma Inhaler, Hibiclens shower night before and morning of surgery Pre op orders: yes Ride home:  April Ross (sister) [phone] or Ivan Anchors (mother) [phone]

## 2018-02-19 ENCOUNTER — Ambulatory Visit: Payer: Medicaid Other | Admitting: Physical Therapy

## 2018-02-19 ENCOUNTER — Other Ambulatory Visit: Payer: Self-pay | Admitting: Orthopedic Surgery

## 2018-02-19 ENCOUNTER — Encounter: Payer: Self-pay | Admitting: Physical Therapy

## 2018-02-19 DIAGNOSIS — G8929 Other chronic pain: Secondary | ICD-10-CM | POA: Diagnosis present

## 2018-02-19 DIAGNOSIS — M25562 Pain in left knee: Secondary | ICD-10-CM

## 2018-02-19 DIAGNOSIS — M25511 Pain in right shoulder: Secondary | ICD-10-CM | POA: Diagnosis present

## 2018-02-19 NOTE — Therapy (Signed)
[facility] [address], [place], [address] Phone: [phone]   Fax:  [phone]  Physical Therapy Treatment  Patient Details  Name: Joanna Hall MRN: [id number] Date of Birth: 08-05-1966 Referring Provider: [name], MD   Encounter Date: 02/19/2018  PT End of Session - 02/19/18 1015    Visit Number  3    Number of Visits  13    Date for PT Re-Evaluation  03/13/18    PT Start Time  0950    PT Stop Time  1035    PT Time Calculation (min)  45 min    Activity Tolerance  Patient tolerated treatment well    Behavior During Therapy  [facility] for tasks assessed/performed       Past Medical History:  Diagnosis Date  . Anxiety   . Aortic atherosclerosis ([place])   . Arthritis    neck and knees  . 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 Blood transfusion without reported diagnosis   . Cervical cancer ([place])   . Chronic anal fissure   . Chronic diarrhea   . Dyspnea   . Factor V Leiden mutation ([place])   . Fecal incontinence   . Genital warts   . GERD (gastroesophageal reflux disease)   . Heart murmur   . Hemorrhoids   . 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 History of cancer chemotherapy    completed 06/2017  . History of Clostridium difficile infection    while undergoing chemo.  Negative test 10/2017  . Hypertension    during chemo treatments  . 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 Macrocytic anemia with vitamin B12 deficiency   . Perianal condylomata   . Pneumonia    History of  . Pulmonary nodules   . Small bowel obstruction ([place]) 08/2017  . Stiff neck    limited right turn  . Vitamin D deficiency     Past Surgical History:  Procedure Laterality Date  . CHOLECYSTECTOMY    . COLON SURGERY  08/2017   resection  . COLONOSCOPY WITH PROPOFOL N/A 12/20/2017   Procedure: COLONOSCOPY WITH PROPOFOL;  Surgeon: [name], MD;   Location: [facility] ENDOSCOPY;  Service: Gastroenterology;  Laterality: N/A;  . DIAGNOSTIC LAPAROSCOPY    . ESOPHAGOGASTRODUODENOSCOPY (EGD) WITH PROPOFOL N/A 12/20/2017   Procedure: ESOPHAGOGASTRODUODENOSCOPY (EGD) WITH PROPOFOL;  Surgeon: [name], MD;  Location: [place];  Service: Gastroenterology;  Laterality: N/A;  . LAPAROTOMY N/A 08/31/2017   Procedure: EXPLORATORY LAPAROTOMY for SBO, ileocolectomy, removal of piece of uterine wall;  Surgeon: [name], MD;  Location: [facility] ORS;  Service: General;  Laterality: N/A;  . OOPHORECTOMY    . SMALL INTESTINE SURGERY    . TANDEM RING INSERTION     x3  . THORACOTOMY      There were no vitals filed for this visit.  Subjective Assessment - 02/19/18 0958    Subjective  Patient reports 4/10 pain in the R shoulder today, with some heaviness in the shoulders with overhead activities. Patient reports she is able to complete her HEP without pain at this time.  Patient reports she thought her last appt was cancelled, and that she was sick the appt before that; PT advised patient to call office if she has any concerns about her appointments. Patient reports she is having a surgery Friday  and would like to call back to reschedule more appointments as able.     Pertinent History  Patient is a 52 year old female with hx of cervical cancer (finishing chemotherapy and cancer free since Sept 2019), blot clotting in brain, kidneys, and lungs Nov 2019, and bowel obstruction requiring sx Nov 2019. Patient is seen today for R should pain that she reports has been going on "for years" but has been exacerbated over the past year. Patient reports she had a cortisone injection 3/12 which she reports little pain relief from. Patient reports her pain in in both shoulders at this time. Patient is scheduled for MRI if conservative treatment fails. Patient reports she is having pain with all shoulder motions at this time and reports her pain is worse in the am, and  that she is unable to sleep d/t "tossing and turning" to find a comfortable position. Pt reports worst pain 5/10 and best 0/10 in the past week.     Limitations  House hold activities;Lifting    How long can you sit comfortably?  unlimited    How long can you stand comfortably?  pain with all standing d/t bilat knee arthritis    How long can you walk comfortably?  pain with all walking d/t bilat knee arthritis    Diagnostic tests  Xrays: mild cervical arthritis; MRI scheduled    Patient Stated Goals  decrease pain    Pain Onset  1 to 4 weeks ago        Manual -GHJ PA mobs Grade I-II 30 sec bouts for pain modulation 6 bouts in neutral; 6 bouts in end range abd -GHJ traction 10sec distraction/10sec relax x10 -PROM into abd and flex 10each increasing end range each bout with 3-5 sec holds in end range     Ther-Ex -R shoulder IR red tband 3x 10 w/ cuing for eccentric control -R shoulder ER red tband 3x 10 w/ cuing for shoulder positioning -Standing neutral rows red tband 3x 10 with cuing to prevent shoulder hiking -Standing R shoulder flexion with R tband 2x 10 with cuing for eccentric control  -Standing R shoulder abd with R tband 2x 10 with cuing for eccentric control -Standing high rows 2x 10 w/ cuing for posture  *All exercises given to patient in a printout at frequency of 3x 10; 3x/week as it is unknown when patient will be able to return to PT at this time *                PT Education - 02/19/18 1015    Education provided  Yes    Education Details  Exercise form    Person(s) Educated  Patient    Methods  Explanation;Verbal cues;Demonstration    Comprehension  Verbalized understanding;Returned demonstration;Verbal cues required       PT Short Term Goals - 01/30/18 1725      PT SHORT TERM GOAL #1   Title  Pt will be independent with HEP in order to improve strength and ROM to improve function at home and work.    Time  2    Period  Weeks    Status  New         PT Long Term Goals - 01/30/18 1726      PT LONG TERM GOAL #1   Title  Patient will demonstrate symmetrical shoulder and cervical AROM wnl in order to complete self care ADL's     Baseline  4/17 see eval  Time  6    Period  Weeks    Status  New      PT LONG TERM GOAL #2   Title  Patient will increase FOTO score to 65 to demonstrate predicted increase in functional mobility to complete ADLs    Baseline  4/17 50    Time  6    Period  Weeks    Status  New      PT LONG TERM GOAL #3   Title  Patient will demonstrate bilat gross shoulder strength of 4/5 in order to safely complete heavy housework    Baseline  4/17 see eval    Time  6    Period  Weeks    Status  New            Plan - 02/19/18 1047    Clinical Impression Statement  Pt reports less pain in R shoulder today and describes more weakness with "heaviness sensation" with overhead activities. Patient admits that she is more concerned with her digestive issues and knee pain today, as she is preparing for surgery for these. PT led patient reported decreased pain following manual therapy, which allowed PT to led patient through therex without increased pain. During RTC strengthening exercises, patient required cuing for proper form for most excercises, but was able to complete exercises with 100% accuracy at the end of her sets. PT printed hand out for patient of all strengthening exercises, to complete in addition to her previously established HEP for maintainence at home, as it is unknown at this time when patient wll be able to return following surgery.     Clinical Impairments Affecting Rehab Potential  (+) fairly young age, motivation (-) chronicity of pain, sedentary lifesyle, multiple other comorbidites    PT Frequency  2x / week    PT Duration  6 weeks    PT Treatment/Interventions  Dry needling;Passive range of motion;Manual techniques;Neuromuscular re-education;Iontophoresis 57m/ml Dexamethasone;Moist  Heat;Functional mobility training;Therapeutic activities;Patient/family education;Taping;Therapeutic exercise;Electrical Stimulation;Cryotherapy;Traction    PT Next Visit Plan  manual techniques for cervical musculature tension, RTC strengthening    PT Home Exercise Plan  Added 5/6: standing rows, high rows, shoulder flex, shoulder abd 3x 10 3days/week; UT stretch, pec doorway stretch, levator stretch, yellow tband IR/ER    Consulted and Agree with Plan of Care  Patient       Patient will benefit from skilled therapeutic intervention in order to improve the following deficits and impairments:  Increased fascial restricitons, Pain, Improper body mechanics, Increased muscle spasms, Impaired tone, Postural dysfunction, Decreased mobility, Decreased range of motion, Decreased strength, Decreased activity tolerance, Impaired UE functional use, Impaired flexibility  Visit Diagnosis: Chronic right shoulder pain     Problem List Patient Active Problem List   Diagnosis Date Noted  . Positive ANA (antinuclear antibody) 02/04/2018  . Aortic atherosclerosis ([place] 01/15/2018  . Elevated MCV 01/15/2018  . Anemia 01/15/2018  . Genital warts 01/15/2018  . Vitamin D deficiency 01/15/2018  . Pernicious anemia   . Rectal bleeding   . Impingement syndrome of shoulder region 12/19/2017  . Multiple joint pain 12/19/2017  . Osteoarthritis of right knee 12/19/2017  . B12 deficiency 12/11/2017  . Lung nodules 12/11/2017  . Malnutrition of moderate degree 12/04/2017  . Abdominal pain 12/03/2017  . History of Clostridium difficile infection 10/23/2017  . Factor V Leiden ([place] 10/19/2017  . Goals of care, counseling/discussion 10/19/2017  . Abnormal cervical Papanicolaou smear 09/18/2017  . Cervical cancer, FIGO stage IB1 ([place] 09/18/2017  .  Chronic hepatitis C without hepatic coma ([place]) 09/18/2017  . Cytopenia 09/18/2017  . Diarrhea 09/18/2017  . Elevated LFTs 09/18/2017  . Erosive gastropathy  09/18/2017  . Hyponatremia 09/18/2017  . Intestinal infection due to Clostridium difficile 09/18/2017  . Lower abdominal pain 09/18/2017  . 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 Malignant neoplasm of overlapping sites of cervix ([place]) 09/18/2017  . Pelvic inflammatory disease (PID) 09/18/2017  . Renal insufficiency 09/18/2017  . Wound infection after surgery 09/12/2017  . Hypokalemia   . Hypomagnesemia   . Ileocolic anastomotic leak   . Small bowel obstruction ([place]) 08/27/2017  . Cervical arthritis 07/18/2017  . Dysuria 06/20/2017  . Metastatic cancer ([place]) 05/12/2017  . Hepatitis, chronic ([place]) 05/05/2017  . Essential hypertension 03/15/2017  . Anemia in other chronic diseases classified elsewhere 03/01/2017  . Chemotherapy-induced neutropenia ([place]) 01/29/2017  . 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 [place], [address] Phone: [phone]   Fax:  [phone]  Name: SCOTLYNN NOYES MRN: [id number] Date of Birth: 1965-10-31

## 2018-02-21 ENCOUNTER — Emergency Department
Admission: EM | Admit: 2018-02-21 | Discharge: 2018-02-21 | Disposition: A | Discharge status: Home / Self Care | Payer: Medicaid Other | Attending: Emergency Medicine | Admitting: Emergency Medicine

## 2018-02-21 ENCOUNTER — Encounter: Payer: Self-pay | Admitting: Emergency Medicine

## 2018-02-21 ENCOUNTER — Ambulatory Visit: Payer: Medicaid Other | Admitting: Physical Therapy

## 2018-02-21 DIAGNOSIS — I10 Essential (primary) hypertension: Secondary | ICD-10-CM | POA: Diagnosis not present

## 2018-02-21 DIAGNOSIS — Z79899 Other long term (current) drug therapy: Secondary | ICD-10-CM | POA: Insufficient documentation

## 2018-02-21 DIAGNOSIS — M25511 Pain in right shoulder: Secondary | ICD-10-CM | POA: Insufficient documentation

## 2018-02-21 DIAGNOSIS — Z87891 Personal history of nicotine dependence: Secondary | ICD-10-CM | POA: Diagnosis not present

## 2018-02-21 MED ORDER — TRAMADOL HCL 50 MG PO TABS: 50.0000 mg | ORAL_TABLET | Freq: Four times a day (QID) | ORAL | 0 refills | Status: DC | PRN

## 2018-02-21 MED ORDER — METHYLPREDNISOLONE 4 MG PO TBPK: ORAL_TABLET | ORAL | 0 refills | Status: DC

## 2018-02-21 MED ORDER — BUPIVACAINE LIPOSOME 1.3 % IJ SUSP
20.0000 mL | INTRAMUSCULAR | Status: DC
  Filled 2018-02-21: qty 20

## 2018-02-21 MED ORDER — CYCLOBENZAPRINE HCL 10 MG PO TABS: 10.0000 mg | ORAL_TABLET | Freq: Three times a day (TID) | ORAL | 0 refills | Status: DC | PRN

## 2018-02-21 NOTE — ED Notes (Signed)
See triage note  Presents with pain to right arm  States pain is radiating from neck into shoulder and into right elbow..  States she has been going to PT for arm for about 1 month  Increased pain with movement

## 2018-02-21 NOTE — ED Triage Notes (Signed)
Pt comes into the ED via POV c/o right shoulder pain after going to PT for a previous shoulder injury.  Patient states the irritation has now caused the shoulder pain to increase and go into her neck.  Patient has h/o arthritis in that shoulder and continual problems.  Patient in NAD at this time and has full mobility in the shoulder.

## 2018-02-21 NOTE — ED Triage Notes (Signed)
FIRST NURSE NOTE-pt thinks she pulled a muscle. NAD. Ambulatory.

## 2018-02-21 NOTE — ED Provider Notes (Signed)
[facility] Emergency Department Provider Note  ____________________________________________   First MD Initiated Contact with Patient 02/21/18 1318     (approximate)  I have reviewed the triage vital signs and the nursing notes.   HISTORY  Chief Complaint Shoulder Pain    HPI Joanna Hall is a 52 y.o. female presents emergency department complaining of right shoulder pain.  She states she has arthritis in the shoulder and was sent to PT prior to the orthopedic being able to perform surgery.  She states the pain has gotten worse.  She is having pain that radiates into the lower shoulder.  It is more painful to raise her arm overhead.  She denies any numbness or tingling.  Past Medical History:  Diagnosis Date  . Anxiety   . Aortic atherosclerosis ([place])   . Arthritis    neck and knees  . 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 Blood transfusion without reported diagnosis   . Cervical cancer ([place])   . Chronic anal fissure   . Chronic diarrhea   . Dyspnea   . Factor V Leiden mutation ([place])   . Fecal incontinence   . Genital warts   . GERD (gastroesophageal reflux disease)   . Heart murmur   . Hemorrhoids   . 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 History of cancer chemotherapy    completed 06/2017  . History of Clostridium difficile infection    while undergoing chemo.  Negative test 10/2017  . Hypertension    during chemo treatments  . 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 Macrocytic anemia with vitamin B12 deficiency   . Perianal condylomata   . Pneumonia    History of  . Pulmonary nodules   . Small bowel obstruction ([place]) 08/2017  . Stiff neck    limited right turn  . Vitamin D deficiency     Patient Active Problem List   Diagnosis Date Noted  . Positive ANA (antinuclear antibody) 02/04/2018  . Aortic atherosclerosis ([place]) 01/15/2018  . Elevated MCV 01/15/2018  .  Anemia 01/15/2018  . Genital warts 01/15/2018  . Vitamin D deficiency 01/15/2018  . Pernicious anemia   . Rectal bleeding   . Impingement syndrome of shoulder region 12/19/2017  . Multiple joint pain 12/19/2017  . Osteoarthritis of right knee 12/19/2017  . B12 deficiency 12/11/2017  . Lung nodules 12/11/2017  . Malnutrition of moderate degree 12/04/2017  . Abdominal pain 12/03/2017  . History of Clostridium difficile infection 10/23/2017  . Factor V Leiden ([place]) 10/19/2017  . Goals of care, counseling/discussion 10/19/2017  . Abnormal cervical Papanicolaou smear 09/18/2017  . Cervical cancer, FIGO stage IB1 ([place]) 09/18/2017  . Chronic hepatitis C without hepatic coma ([place]) 09/18/2017  . Cytopenia 09/18/2017  . Diarrhea 09/18/2017  . Elevated LFTs 09/18/2017  . Erosive gastropathy 09/18/2017  . Hyponatremia 09/18/2017  . Intestinal infection due to Clostridium difficile 09/18/2017  . Lower abdominal pain 09/18/2017  . 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 Malignant neoplasm of overlapping sites of cervix ([place]) 09/18/2017  . Pelvic inflammatory disease (PID) 09/18/2017  . Renal insufficiency 09/18/2017  . Wound infection after surgery 09/12/2017  . Hypokalemia   . Hypomagnesemia   . Ileocolic anastomotic leak   . Small bowel obstruction ([place]) 08/27/2017  . Cervical arthritis 07/18/2017  . Dysuria 06/20/2017  . Metastatic cancer ([employer]) 05/12/2017  .  Hepatitis, chronic ([place]) 05/05/2017  . Essential hypertension 03/15/2017  . Anemia in other chronic diseases classified elsewhere 03/01/2017  . Chemotherapy-induced neutropenia ([place]) 01/29/2017  . 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 CHOLECYSTECTOMY    . COLON SURGERY  08/2017   resection  . COLONOSCOPY WITH PROPOFOL N/A 12/20/2017   Procedure: COLONOSCOPY WITH PROPOFOL;  Surgeon: [name], MD;  Location: [facility] ENDOSCOPY;  Service: Gastroenterology;   Laterality: N/A;  . DIAGNOSTIC LAPAROSCOPY    . ESOPHAGOGASTRODUODENOSCOPY (EGD) WITH PROPOFOL N/A 12/20/2017   Procedure: ESOPHAGOGASTRODUODENOSCOPY (EGD) WITH PROPOFOL;  Surgeon: [name], MD;  Location: [place];  Service: Gastroenterology;  Laterality: N/A;  . LAPAROTOMY N/A 08/31/2017   Procedure: EXPLORATORY LAPAROTOMY for SBO, ileocolectomy, removal of piece of uterine wall;  Surgeon: [name], MD;  Location: [facility] ORS;  Service: General;  Laterality: N/A;  . OOPHORECTOMY    . SMALL INTESTINE SURGERY    . TANDEM RING INSERTION     x3  . THORACOTOMY      Prior to Admission medications   Medication Sig Start Date End Date Taking? Authorizing Provider  ALOE VERA CONCENTRATE PO Take by mouth.    [provider]  cholestyramine (QUESTRAN) 4 g packet Take 1 packet (4 g total) by mouth 3 (three) times daily. Patient taking differently: Take 4 g by mouth 2 (two) times daily.  02/11/18 05/12/18  [name], MD  cyanocobalamin (,VITAMIN B-12,) 1000 MCG/ML injection Inject 1ML every week x 4 weeks, then 1ML every other week x 2 months, then monthly x 3 months 11/29/17   [name], MD  cyclobenzaprine (FLEXERIL) 10 MG tablet Take 1 tablet (10 mg total) by mouth 3 (three) times daily as needed. 02/21/18   Fisher, Linden Dolin, PA-C  dicyclomine (BENTYL) 10 MG capsule Take 1 capsule (10 mg total) by mouth 4 (four) times daily -  before meals and at bedtime. 02/11/18 08/10/18  [name], MD  Digestive Enzymes (ENZYME DIGEST PO) Take by mouth.    [provider]  fluticasone (FLONASE) 50 MCG/ACT nasal spray Place 2 sprays into both nostrils daily. 02/11/18   Steele Sizer, MD  hydrocortisone (ANUSOL-HC) 2.5 % rectal cream Place 1 application rectally 2 (two) times daily. 11/15/17   [name], MD  loperamide (IMODIUM A-D) 2 MG tablet Take 2 mg by mouth 4 (four) times daily as needed for diarrhea or loose stools.    [provider]    loratadine (CLARITIN) 10 MG tablet Take 1 tablet (10 mg total) by mouth daily. Patient taking differently: Take 10 mg by mouth every morning.  02/11/18   Steele Sizer, MD  methylPREDNISolone (MEDROL DOSEPAK) 4 MG TBPK tablet Take 6 pills on day one then decrease by 1 pill each day 02/21/18   Versie Starks, PA-C  Multiple Vitamin (MULTIVITAMIN WITH MINERALS) TABS tablet Take 1 tablet daily by mouth.    [provider]  Probiotic Product (PROBIOMAX DAILY DF PO) Take by mouth.    [provider]  rivaroxaban (XARELTO) 20 MG TABS tablet Take 1 tablet (20 mg total) by mouth daily with supper. 12/11/17   Karen Kitchens, NP  Syringe/Needle, Disp, (SYRINGE 3CC/22GX1") 22G X 1" 3 ML MISC For use with Vitamin B12 injections 11/29/17   Vanga, Tally Due, MD  traMADol (ULTRAM) 50 MG tablet Take 1 tablet (50 mg total) by mouth every 6 (six) hours as needed.  02/21/18   Fisher, Linden Dolin, PA-C  VENTOLIN HFA 108 (90 Base) MCG/ACT inhaler INHALE 1-2 PUFFS BY MOUTH EVERY 4 HOURS AS NEEDED FOR WHEEZING / SHORTNESS OF BREATH 08/02/17   [provider]    Allergies Ketamine  Family History  Problem Relation Age of Onset  . Hypertension Father   . Diabetes Father   . Alcohol abuse Daughter   . Hypertension Maternal Grandmother   . Diabetes Maternal Grandmother   . Diabetes Paternal Grandmother   . Hypertension Paternal Grandmother     Social History Social History   Tobacco Use  . Smoking status: Former Smoker    Last attempt to quit: 10/16/2006    Years since quitting: 11.3  . Smokeless tobacco: Never Used  Substance Use Topics  . Alcohol use: Yes    Frequency: Never    Comment: seldom  . Drug use: No    Review of Systems  Constitutional: No fever/chills Eyes: No visual changes. ENT: No sore throat. Respiratory: Denies cough Genitourinary: Negative for dysuria. Musculoskeletal: Negative for back pain.  Positive for right shoulder pain Skin: Negative for  rash.    ____________________________________________   PHYSICAL EXAM:  VITAL SIGNS: ED Triage Vitals  Enc Vitals Group     BP 02/21/18 1245 111/71     Pulse Rate 02/21/18 1245 90     Resp 02/21/18 1245 18     Temp 02/21/18 1245 98.3 F (36.8 C)     Temp Source 02/21/18 1245 Oral     SpO2 02/21/18 1245 99 %     Weight --      Height --      Head Circumference --      Peak Flow --      Pain Score 02/21/18 1253 7     Pain Loc --      Pain Edu? --      Excl. in [place]? --     Constitutional: Alert and oriented. Well appearing and in no acute distress. Eyes: Conjunctivae are normal.  Head: Atraumatic. Nose: No congestion/rhinnorhea. Mouth/Throat: Mucous membranes are moist.   Cardiovascular: Normal rate, regular rhythm. Respiratory: Normal respiratory effort.  No retractions GU: deferred Musculoskeletal: Decreased range of motion of the right shoulder.  The patient cannot lift her arm fully overhead or do any internal rotation.  The joint is tender to palpation.  She is neurovascularly intact. Neurologic:  Normal speech and language.  Skin:  Skin is warm, dry and intact. No rash noted. Psychiatric: Mood and affect are normal. Speech and behavior are normal.  ____________________________________________   LABS (all labs ordered are listed, but only abnormal results are displayed)  Labs Reviewed - No data to display ____________________________________________   ____________________________________________  RADIOLOGY    ____________________________________________   PROCEDURES  Procedure(s) performed: Sling was applied by the tech  Procedures    ____________________________________________   INITIAL IMPRESSION / ASSESSMENT AND PLAN / ED COURSE  Pertinent labs & imaging results that were available during my care of the patient were reviewed by me and considered in my medical decision making (see chart for details).  Patient is a 52 year old female  presents emergency department complaining of right shoulder pain.  She has been going through physical therapy and the pain has worsened.  On physical exam the patient does have decreased range of motion of the right shoulder.  She is tender at the joint and along the muscles.  Plan findings to the patient.  Explained her this is all muscular.  She is given a sling while in the ED.  She is given medications for pain and inflammation.  She is to follow-up with orthopedics.  She is to apply ice to the area to decrease the swelling.  She was discharged in stable condition     As part of my medical decision making, I reviewed the following data within the [place] notes reviewed and incorporated, Old chart reviewed, Notes from prior ED visits and [place] Controlled Substance Database  ____________________________________________   FINAL CLINICAL IMPRESSION(S) / ED DIAGNOSES  Final diagnoses:  Acute pain of right shoulder      NEW MEDICATIONS STARTED DURING THIS VISIT:  Discharge Medication List as of 02/21/2018  1:26 PM    START taking these medications   Details  cyclobenzaprine (FLEXERIL) 10 MG tablet Take 1 tablet (10 mg total) by mouth 3 (three) times daily as needed., Starting Thu 02/21/2018, Print    methylPREDNISolone (MEDROL DOSEPAK) 4 MG TBPK tablet Take 6 pills on day one then decrease by 1 pill each day, Print    traMADol (ULTRAM) 50 MG tablet Take 1 tablet (50 mg total) by mouth every 6 (six) hours as needed., Starting Thu 02/21/2018, Print         Note:  This document was prepared using Dragon voice recognition software and may include unintentional dictation errors.    Versie Starks, PA-C 02/21/18 1540    Eula Listen, MD 02/21/18 [phone]

## 2018-02-22 ENCOUNTER — Ambulatory Visit (HOSPITAL_BASED_OUTPATIENT_CLINIC_OR_DEPARTMENT_OTHER)
Admission: RE | Admit: 2018-02-22 | Discharge: 2018-02-22 | Disposition: A | Discharge status: Home / Self Care | Payer: Medicaid Other | Source: Ambulatory Visit | Attending: Surgery | Admitting: Surgery

## 2018-02-22 ENCOUNTER — Encounter (HOSPITAL_BASED_OUTPATIENT_CLINIC_OR_DEPARTMENT_OTHER)
Admission: RE | Disposition: A | Discharge status: Home / Self Care | Payer: Self-pay | Source: Ambulatory Visit | Attending: Surgery

## 2018-02-22 ENCOUNTER — Encounter (HOSPITAL_BASED_OUTPATIENT_CLINIC_OR_DEPARTMENT_OTHER): Payer: Self-pay | Admitting: *Deleted

## 2018-02-22 ENCOUNTER — Ambulatory Visit (HOSPITAL_BASED_OUTPATIENT_CLINIC_OR_DEPARTMENT_OTHER): Payer: Medicaid Other | Admitting: Anesthesiology

## 2018-02-22 DIAGNOSIS — Z923 Personal history of irradiation: Secondary | ICD-10-CM | POA: Diagnosis not present

## 2018-02-22 DIAGNOSIS — I10 Essential (primary) hypertension: Secondary | ICD-10-CM | POA: Diagnosis not present

## 2018-02-22 DIAGNOSIS — Z7901 Long term (current) use of anticoagulants: Secondary | ICD-10-CM | POA: Insufficient documentation

## 2018-02-22 DIAGNOSIS — Z86711 Personal history of pulmonary embolism: Secondary | ICD-10-CM | POA: Diagnosis not present

## 2018-02-22 DIAGNOSIS — Z8541 Personal history of malignant neoplasm of cervix uteri: Secondary | ICD-10-CM | POA: Diagnosis not present

## 2018-02-22 DIAGNOSIS — A630 Anogenital (venereal) warts: Secondary | ICD-10-CM | POA: Diagnosis not present

## 2018-02-22 DIAGNOSIS — Z9049 Acquired absence of other specified parts of digestive tract: Secondary | ICD-10-CM | POA: Diagnosis not present

## 2018-02-22 DIAGNOSIS — Z87891 Personal history of nicotine dependence: Secondary | ICD-10-CM | POA: Diagnosis not present

## 2018-02-22 DIAGNOSIS — Z86718 Personal history of other venous thrombosis and embolism: Secondary | ICD-10-CM | POA: Diagnosis not present

## 2018-02-22 DIAGNOSIS — Z79899 Other long term (current) drug therapy: Secondary | ICD-10-CM | POA: Diagnosis not present

## 2018-02-22 HISTORY — DX: Pneumonia, unspecified organism: J18.9

## 2018-02-22 HISTORY — DX: Other vitamin B12 deficiency anemias: D51.8

## 2018-02-22 HISTORY — DX: Vitamin D deficiency, unspecified: E55.9

## 2018-02-22 HISTORY — DX: Chronic anal fissure: K60.1

## 2018-02-22 HISTORY — DX: Vitamin B12 deficiency anemia, unspecified: D51.9

## 2018-02-22 HISTORY — DX: Full incontinence of feces: R15.9

## 2018-02-22 HISTORY — DX: Dyspnea, unspecified: R06.00

## 2018-02-22 HISTORY — DX: Other nonspecific abnormal finding of lung field: R91.8

## 2018-02-22 HISTORY — DX: Activated protein C resistance: D68.51

## 2018-02-22 HISTORY — DX: Personal history of other infectious and parasitic diseases: Z86.19

## 2018-02-22 HISTORY — DX: Malignant neoplasm of cervix uteri, unspecified: C53.9

## 2018-02-22 HISTORY — PX: LASER ABLATION CONDOLAMATA: SHX5941

## 2018-02-22 HISTORY — DX: Unspecified intestinal obstruction, unspecified as to partial versus complete obstruction: K56.609

## 2018-02-22 HISTORY — DX: Unspecified hemorrhoids: K64.9

## 2018-02-22 HISTORY — DX: Anogenital (venereal) warts: A63.0

## 2018-02-22 HISTORY — DX: Noninfective gastroenteritis and colitis, unspecified: K52.9

## 2018-02-22 HISTORY — DX: Atherosclerosis of aorta: I70.0

## 2018-02-22 SURGERY — ABLATION, CONDYLOMA, USING LASER
Anesthesia: General | Site: Rectum

## 2018-02-22 MED ORDER — DEXAMETHASONE SODIUM PHOSPHATE 10 MG/ML IJ SOLN
INTRAMUSCULAR | Status: AC
  Filled 2018-02-22: qty 1

## 2018-02-22 MED ORDER — BUPIVACAINE-EPINEPHRINE 0.25% -1:200000 IJ SOLN
INTRAMUSCULAR | Status: DC | PRN
  Administered 2018-02-22: 30 mL

## 2018-02-22 MED ORDER — OXYCODONE HCL 5 MG PO TABS
5.0000 mg | ORAL_TABLET | Freq: Once | ORAL | Status: DC | PRN
  Filled 2018-02-22: qty 1

## 2018-02-22 MED ORDER — MIDAZOLAM HCL 2 MG/2ML IJ SOLN
INTRAMUSCULAR | Status: AC
  Filled 2018-02-22: qty 2

## 2018-02-22 MED ORDER — ACETAMINOPHEN 500 MG PO TABS
1000.0000 mg | ORAL_TABLET | ORAL | Status: AC
  Administered 2018-02-22: 1000 mg via ORAL
  Filled 2018-02-22: qty 2

## 2018-02-22 MED ORDER — ZINC OXIDE 40 % EX OINT
TOPICAL_OINTMENT | Freq: Two times a day (BID) | CUTANEOUS | Status: DC
  Filled 2018-02-22: qty 113

## 2018-02-22 MED ORDER — CHLORHEXIDINE GLUCONATE CLOTH 2 % EX PADS
6.0000 | MEDICATED_PAD | Freq: Once | CUTANEOUS | Status: DC
  Filled 2018-02-22: qty 6

## 2018-02-22 MED ORDER — ONDANSETRON HCL 4 MG/2ML IJ SOLN
INTRAMUSCULAR | Status: AC
  Filled 2018-02-22: qty 2

## 2018-02-22 MED ORDER — LIDOCAINE 2% (20 MG/ML) 5 ML SYRINGE
INTRAMUSCULAR | Status: AC
  Filled 2018-02-22: qty 5

## 2018-02-22 MED ORDER — LACTATED RINGERS IV BOLUS
1000.0000 mL | Freq: Three times a day (TID) | INTRAVENOUS | Status: DC | PRN
  Filled 2018-02-22: qty 1000

## 2018-02-22 MED ORDER — SODIUM CHLORIDE 0.9% FLUSH
3.0000 mL | Freq: Two times a day (BID) | INTRAVENOUS | Status: DC
  Filled 2018-02-22: qty 3

## 2018-02-22 MED ORDER — MEPERIDINE HCL 25 MG/ML IJ SOLN
6.2500 mg | INTRAMUSCULAR | Status: DC | PRN
  Filled 2018-02-22: qty 1

## 2018-02-22 MED ORDER — FENTANYL CITRATE (PF) 100 MCG/2ML IJ SOLN
25.0000 ug | INTRAMUSCULAR | Status: DC | PRN
  Filled 2018-02-22: qty 1

## 2018-02-22 MED ORDER — ROCURONIUM BROMIDE 10 MG/ML (PF) SYRINGE
PREFILLED_SYRINGE | INTRAVENOUS | Status: AC
  Filled 2018-02-22: qty 5

## 2018-02-22 MED ORDER — KETOROLAC TROMETHAMINE 15 MG/ML IJ SOLN
15.0000 mg | Freq: Once | INTRAMUSCULAR | Status: DC
  Filled 2018-02-22: qty 1

## 2018-02-22 MED ORDER — HYDROCORTISONE ACE-PRAMOXINE 2.5-1 % RE CREA
1.0000 "application " | TOPICAL_CREAM | Freq: Four times a day (QID) | RECTAL | Status: DC | PRN
  Filled 2018-02-22: qty 30

## 2018-02-22 MED ORDER — SODIUM CHLORIDE 0.9 % IV SOLN
250.0000 mL | INTRAVENOUS | Status: DC | PRN
  Filled 2018-02-22: qty 250

## 2018-02-22 MED ORDER — ACETAMINOPHEN 160 MG/5ML PO SOLN
325.0000 mg | ORAL | Status: DC | PRN
  Filled 2018-02-22: qty 20.3

## 2018-02-22 MED ORDER — OXYCODONE HCL 5 MG PO TABS
5.0000 mg | ORAL_TABLET | ORAL | Status: DC | PRN
  Filled 2018-02-22: qty 2

## 2018-02-22 MED ORDER — SODIUM CHLORIDE 0.9% FLUSH
3.0000 mL | INTRAVENOUS | Status: DC | PRN
  Filled 2018-02-22: qty 3

## 2018-02-22 MED ORDER — PROPOFOL 10 MG/ML IV BOLUS
INTRAVENOUS | Status: DC | PRN
  Administered 2018-02-22: 30 mg via INTRAVENOUS
  Administered 2018-02-22: 20 mg via INTRAVENOUS
  Administered 2018-02-22: 150 mg via INTRAVENOUS

## 2018-02-22 MED ORDER — ONDANSETRON HCL 4 MG/2ML IJ SOLN
INTRAMUSCULAR | Status: DC | PRN
  Administered 2018-02-22: 4 mg via INTRAVENOUS

## 2018-02-22 MED ORDER — ONDANSETRON HCL 4 MG/2ML IJ SOLN
4.0000 mg | Freq: Once | INTRAMUSCULAR | Status: DC | PRN
  Filled 2018-02-22: qty 2

## 2018-02-22 MED ORDER — WITCH HAZEL-GLYCERIN EX PADS
1.0000 "application " | MEDICATED_PAD | CUTANEOUS | Status: DC | PRN
  Filled 2018-02-22: qty 100

## 2018-02-22 MED ORDER — BUPIVACAINE LIPOSOME 1.3 % IJ SUSP
INTRAMUSCULAR | Status: DC | PRN
  Administered 2018-02-22: 20 mL

## 2018-02-22 MED ORDER — ACETAMINOPHEN 325 MG PO TABS
325.0000 mg | ORAL_TABLET | ORAL | Status: DC | PRN
  Filled 2018-02-22: qty 2

## 2018-02-22 MED ORDER — ACETAMINOPHEN 325 MG PO TABS
650.0000 mg | ORAL_TABLET | ORAL | Status: DC | PRN
  Filled 2018-02-22: qty 2

## 2018-02-22 MED ORDER — FENTANYL CITRATE (PF) 100 MCG/2ML IJ SOLN
INTRAMUSCULAR | Status: AC
  Filled 2018-02-22: qty 2

## 2018-02-22 MED ORDER — DEXAMETHASONE SODIUM PHOSPHATE 10 MG/ML IJ SOLN
INTRAMUSCULAR | Status: DC | PRN
  Administered 2018-02-22: 10 mg via INTRAVENOUS

## 2018-02-22 MED ORDER — LIDOCAINE 2% (20 MG/ML) 5 ML SYRINGE
INTRAMUSCULAR | Status: DC | PRN
  Administered 2018-02-22: 100 mg via INTRAVENOUS

## 2018-02-22 MED ORDER — ACETAMINOPHEN 500 MG PO TABS
ORAL_TABLET | ORAL | Status: AC
  Filled 2018-02-22: qty 1

## 2018-02-22 MED ORDER — LACTATED RINGERS IV SOLN
INTRAVENOUS | Status: DC
  Administered 2018-02-22: 10:00:00 via INTRAVENOUS
  Filled 2018-02-22: qty 1000

## 2018-02-22 MED ORDER — RIVAROXABAN 20 MG PO TABS: 20.0000 mg | ORAL_TABLET | Freq: Every day | ORAL | 1 refills | Status: DC

## 2018-02-22 MED ORDER — GABAPENTIN 300 MG PO CAPS
300.0000 mg | ORAL_CAPSULE | ORAL | Status: AC
  Administered 2018-02-22: 300 mg via ORAL
  Filled 2018-02-22: qty 1

## 2018-02-22 MED ORDER — CEFAZOLIN SODIUM-DEXTROSE 2-4 GM/100ML-% IV SOLN
INTRAVENOUS | Status: AC
  Filled 2018-02-22: qty 100

## 2018-02-22 MED ORDER — ACETIC ACID 5 % SOLN
Status: DC | PRN
  Administered 2018-02-22: 1 via TOPICAL

## 2018-02-22 MED ORDER — OXYCODONE HCL 5 MG/5ML PO SOLN
5.0000 mg | Freq: Once | ORAL | Status: DC | PRN
  Filled 2018-02-22: qty 5

## 2018-02-22 MED ORDER — METRONIDAZOLE IN NACL 5-0.79 MG/ML-% IV SOLN
500.0000 mg | INTRAVENOUS | Status: AC
  Administered 2018-02-22: 500 mg via INTRAVENOUS
  Filled 2018-02-22 (×2): qty 100

## 2018-02-22 MED ORDER — ACETAMINOPHEN 500 MG PO TABS
ORAL_TABLET | ORAL | Status: AC
  Filled 2018-02-22: qty 2

## 2018-02-22 MED ORDER — ACETAMINOPHEN 650 MG RE SUPP
650.0000 mg | RECTAL | Status: DC | PRN
Start: 2018-02-22 — End: 2018-02-22
  Filled 2018-02-22: qty 1

## 2018-02-22 MED ORDER — FENTANYL CITRATE (PF) 100 MCG/2ML IJ SOLN
INTRAMUSCULAR | Status: DC | PRN
  Administered 2018-02-22 (×2): 50 ug via INTRAVENOUS

## 2018-02-22 MED ORDER — CEFAZOLIN SODIUM-DEXTROSE 2-4 GM/100ML-% IV SOLN
2.0000 g | INTRAVENOUS | Status: AC
  Administered 2018-02-22: 2 g via INTRAVENOUS
  Filled 2018-02-22: qty 100

## 2018-02-22 MED ORDER — MIDAZOLAM HCL 5 MG/5ML IJ SOLN
INTRAMUSCULAR | Status: DC | PRN
  Administered 2018-02-22: 2 mg via INTRAVENOUS

## 2018-02-22 MED ORDER — OXYCODONE HCL 5 MG PO TABS: 5.0000 mg | ORAL_TABLET | Freq: Four times a day (QID) | ORAL | 0 refills | Status: DC | PRN

## 2018-02-22 MED ORDER — GABAPENTIN 300 MG PO CAPS
ORAL_CAPSULE | ORAL | Status: AC
  Filled 2018-02-22: qty 1

## 2018-02-22 MED ORDER — DIBUCAINE 1 % RE OINT
TOPICAL_OINTMENT | RECTAL | Status: DC | PRN
  Administered 2018-02-22: 1 via RECTAL

## 2018-02-22 MED ORDER — PROPOFOL 10 MG/ML IV BOLUS
INTRAVENOUS | Status: AC
  Filled 2018-02-22: qty 20

## 2018-02-22 SURGICAL SUPPLY — 62 items
BENZOIN TINCTURE PRP APPL 2/3 (GAUZE/BANDAGES/DRESSINGS) IMPLANT
BLADE CLIPPER SURG (BLADE) IMPLANT
BLADE HEX COATED 2.75 (ELECTRODE) ×4 IMPLANT
BLADE SURG 10 STRL SS (BLADE) IMPLANT
BLADE SURG 15 STRL LF DISP TIS (BLADE) ×2 IMPLANT
BLADE SURG 15 STRL SS (BLADE) ×2
BRIEF STRETCH FOR OB PAD LRG (UNDERPADS AND DIAPERS) ×4 IMPLANT
CANISTER SUCTION 1200CC (MISCELLANEOUS) ×4 IMPLANT
COVER BACK TABLE 60X90IN (DRAPES) ×4 IMPLANT
COVER MAYO STAND STRL (DRAPES) ×4 IMPLANT
DECANTER SPIKE VIAL GLASS SM (MISCELLANEOUS) IMPLANT
DRAPE LAPAROTOMY 100X72 PEDS (DRAPES) IMPLANT
DRAPE LG THREE QUARTER DISP (DRAPES) IMPLANT
DRAPE UNDERBUTTOCKS STRL (DRAPE) ×4 IMPLANT
DRAPE UTILITY XL STRL (DRAPES) ×4 IMPLANT
DRSG PAD ABDOMINAL 8X10 ST (GAUZE/BANDAGES/DRESSINGS) ×8 IMPLANT
ELECT NEEDLE TIP 2.8 STRL (NEEDLE) ×4 IMPLANT
ELECT REM PT RETURN 9FT ADLT (ELECTROSURGICAL) ×4
ELECTRODE REM PT RTRN 9FT ADLT (ELECTROSURGICAL) ×2 IMPLANT
GAUZE SPONGE 4X4 12PLY STRL (GAUZE/BANDAGES/DRESSINGS) ×4 IMPLANT
GAUZE SPONGE 4X4 16PLY XRAY LF (GAUZE/BANDAGES/DRESSINGS) ×4 IMPLANT
GLOVE BIOGEL PI IND STRL 7.5 (GLOVE) ×4 IMPLANT
GLOVE BIOGEL PI INDICATOR 7.5 (GLOVE) ×4
GLOVE ECLIPSE 8.0 STRL XLNG CF (GLOVE) ×4 IMPLANT
GLOVE INDICATOR 8.0 STRL GRN (GLOVE) ×4 IMPLANT
GOWN STRL REUS W/TWL XL LVL3 (GOWN DISPOSABLE) ×8 IMPLANT
IV CATH 14GX2 1/4 (CATHETERS) IMPLANT
IV CATH PLACEMENT 20 GA (IV SOLUTION) IMPLANT
KIT TURNOVER CYSTO (KITS) ×4 IMPLANT
LEGGING LITHOTOMY PAIR STRL (DRAPES) IMPLANT
LOOP VESSEL MAXI BLUE (MISCELLANEOUS) IMPLANT
NEEDLE HYPO 22GX1.5 SAFETY (NEEDLE) ×4 IMPLANT
PACK BASIN DAY SURGERY FS (CUSTOM PROCEDURE TRAY) ×4 IMPLANT
PAD ABD 8X10 STRL (GAUZE/BANDAGES/DRESSINGS) ×4 IMPLANT
PAD PREP 24X48 CUFFED NSTRL (MISCELLANEOUS) ×4 IMPLANT
PENCIL BUTTON HOLSTER BLD 10FT (ELECTRODE) ×4 IMPLANT
SURGILUBE 2OZ TUBE FLIPTOP (MISCELLANEOUS) ×4 IMPLANT
SUT CHROMIC 2 0 SH (SUTURE) ×4 IMPLANT
SUT CHROMIC 3 0 SH 27 (SUTURE) ×4 IMPLANT
SUT ETHIBOND 0 (SUTURE) IMPLANT
SUT MNCRL AB 4-0 PS2 18 (SUTURE) IMPLANT
SUT PROLENE 2 0 SH DA (SUTURE) IMPLANT
SUT VIC AB 2-0 UR6 27 (SUTURE) ×4 IMPLANT
SUT VIC AB 3-0 SH 18 (SUTURE) IMPLANT
SWAB COLLECTION DEVICE MRSA (MISCELLANEOUS) IMPLANT
SYR 20CC LL (SYRINGE) ×3 IMPLANT
SYR BULB IRRIGATION 50ML (SYRINGE) IMPLANT
SYR CONTROL 10ML LL (SYRINGE) IMPLANT
SYRINGE 20CC LL (MISCELLANEOUS) ×4 IMPLANT
TAPE CLOTH 3X10 TAN LF (GAUZE/BANDAGES/DRESSINGS) ×4 IMPLANT
TOWEL OR 17X24 6PK STRL BLUE (TOWEL DISPOSABLE) ×8 IMPLANT
TRAY DSU PREP LF (CUSTOM PROCEDURE TRAY) ×4 IMPLANT
TUBE ANAEROBIC SPECIMEN COL (MISCELLANEOUS) IMPLANT
TUBE CONNECTING 12'X1/4 (SUCTIONS) ×1
TUBE CONNECTING 12X1/4 (SUCTIONS) ×3 IMPLANT
TUBING STERILE (MISCELLANEOUS) ×4 IMPLANT
UNDERPAD 30X30 (UNDERPADS AND DIAPERS) ×4 IMPLANT
VACUUM HOSE 7/8X10 W/ WAND (MISCELLANEOUS) IMPLANT
VACUUM HOSE/TUBING 7/8INX6FT (MISCELLANEOUS) ×4 IMPLANT
WATER STERILE IRR 500ML POUR (IV SOLUTION) ×4 IMPLANT
YANKAUER SUCT BULB TIP 10FT TU (MISCELLANEOUS) ×4 IMPLANT
YANKAUER SUCT BULB TIP NO VENT (SUCTIONS) ×4 IMPLANT

## 2018-02-22 NOTE — H&P (Signed)
Joanna Hall  DOB: Apr 15, 1966   Patient Care Team: Arnetha Courser, MD as PCP - General (Family Medicine) Mellody Drown, MD as Consulting Physician (Obstetrics and Gynecology) Lenor Coffin, MD as Attending Physician (Gynecology) Lequita Asal, MD as Medical Oncologist (Hematology and Oncology) [name], MD as Consulting Physician (Gastroenterology) Michael Boston, MD as Consulting Physician (General Surgery)  Marland Kitchen ` Patient sent for surgical consultation at the request of Dr. Sherri Sear  Chief Complaint: perianal warts  The patient is a pleasant female that had some painful lumps around her anus for a while. Bleeding an irritating. She recently required urgent surgery last fall in [facility] for bowel obstruction most likely due to adhesions and thickening of her ileum from prior radiation for cervical cancer. an of having about a foot of ileum resected with ileocecectomy. She used to move her bowels once or twice a day. Now she goes about 5 or 10 times a day. Is getting under better control with fiber, Imodium, cholestyramine, Bentyl. She had a 6 month follow-up colonoscopy and was had a few polyps removed. She is also found to have some anal canal and perianal nodules very suspicious for warts/condyloma. Surgical consultation requested.  No personal nor family history of GI/colon cancer, inflammatory bowel disease, irritable bowel syndrome, allergy such as Celiac Sprue, dietary/dairy problems, colitis, ulcers nor gastritis. No recent sick contacts/gastroenteritis. No travel outside the country. No changes in diet. No dysphagia to solids or liquids. No significant heartburn or reflux. No Melena, hematemesis, coffee ground emesis. No evidence of prior gastric/peptic ulceration. Occasional rectal bleeding with some more loose bowel movements but nothing too severe. 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  (Review of systems as stated in this history (HPI) or in the review of systems. Otherwise all other 12 point ROS are negative) ` ` `   Past Surgical History Illene Regulus, CMA; 12/31/2017 3:39 PM) Colon Polyp Removal - Colonoscopy Gallbladder Surgery - Laparoscopic Lung Surgery Left. Resection of Small Bowel  Diagnostic Studies History Illene Regulus, CMA; 12/31/2017 3:39 PM) Colonoscopy within last year Mammogram 1-3 years ago Pap Smear 1-5 years ago  Medication History Illene Regulus, CMA; 12/31/2017 3:43 PM) Lucrezia Starch (4GM Packet, Oral) Active. Bentyl (10MG Capsule, Oral) Active. Multivitamin Adults (Oral) Active. Xarelto (20MG Tablet, Oral) Active. Medications Reconciled  Social History Illene Regulus, CMA; 12/31/2017 3:39 PM) Alcohol use Occasional alcohol use. Caffeine use Carbonated beverages, Coffee, Tea. Illicit drug use Remotely quit drug use. Tobacco use Former smoker.  Family History Illene Regulus, Leland; 12/31/2017 3:39 PM) Alcohol Abuse Daughter. Arthritis Father, Sister. Depression Mother. Diabetes Mellitus Father. Hypertension Father. Melanoma Father, Mother.  Pregnancy / Birth History Illene Regulus, CMA; 12/31/2017 3:39 PM) Age at menarche 34 years. Age of menopause 90-50 Gravida 2 Maternal age 7-20 Para 2  Other Problems Illene Regulus, CMA; 12/31/2017 3:39 PM) Arthritis Back Pain Cervical Cancer Gastroesophageal Reflux Disease Hemorrhoids Oophorectomy Right. Transfusion history     Review of Systems Lars Mage Spillers CMA; 12/31/2017 3:39 PM) General Present- Chills, Fatigue, Night Sweats and Weight Loss. Not Present- Appetite Loss, Fever and Weight Gain. Skin Not Present- Change in Wart/Mole, Dryness, Hives, Jaundice, New Lesions, Non-Healing Wounds, Rash and Ulcer. HEENT Not Present- Earache, Hearing Loss, Hoarseness, Nose Bleed, Oral Ulcers, Ringing in the Ears,  Seasonal Allergies, Sinus Pain, Sore Throat, Visual Disturbances, Wears glasses/contact lenses and Yellow Eyes. Respiratory Present- Snoring. Not Present- Bloody sputum, Chronic Cough, Difficulty Breathing and Wheezing. Breast Not Present-  Breast Mass, Breast Pain, Nipple Discharge and Skin Changes. Cardiovascular Present- Swelling of Extremities. Not Present- Chest Pain, Difficulty Breathing Lying Down, Leg Cramps, Palpitations, Rapid Heart Rate and Shortness of Breath. Gastrointestinal Present- Abdominal Pain, Chronic diarrhea, Excessive gas, Hemorrhoids, Indigestion and Rectal Pain. Not Present- Bloating, Bloody Stool, Change in Bowel Habits, Constipation, Difficulty Swallowing, Gets full quickly at meals, Nausea and Vomiting. Female Genitourinary Present- Nocturia and Urgency. Not Present- Frequency, Painful Urination and Pelvic Pain. Musculoskeletal Present- Back Pain, Joint Pain, Joint Stiffness and Swelling of Extremities. Not Present- Muscle Pain and Muscle Weakness. Neurological Not Present- Decreased Memory, Fainting, Headaches, Numbness, Seizures, Tingling, Tremor, Trouble walking and Weakness. Psychiatric Not Present- Anxiety, Bipolar, Change in Sleep Pattern, Depression, Fearful and Frequent crying. Endocrine Present- Hot flashes. Not Present- Cold Intolerance, Excessive Hunger, Hair Changes, Heat Intolerance and New Diabetes. Hematology Present- Blood Thinners and Easy Bruising. Not Present- Excessive bleeding, Gland problems, HIV and Persistent Infections.  Vitals (Alisha Spillers CMA; 12/31/2017 3:40 PM) 12/31/2017 3:40 PM Weight: 159 lb Height: 69.5in Body Surface Area: 1.88 m Body Mass Index: 23.14 kg/m  Pulse: 68 (Regular)  BP: 110/60 (Sitting, Left Arm, Standard)      Physical Exam Adin Hector MD; 12/31/2017 4:00 PM)  General Mental Status-Alert. General Appearance-Not in acute distress, Not Sickly. Orientation-Oriented X3. Hydration-Well  hydrated. Voice-Normal.  Integumentary Global Assessment Upon inspection and palpation of skin surfaces of the - Axillae: non-tender, no inflammation or ulceration, no drainage. and Distribution of scalp and body hair is normal. General Characteristics Temperature - normal warmth is noted.  Head and Neck Head-normocephalic, atraumatic with no lesions or palpable masses. Face Global Assessment - atraumatic, no absence of expression. Neck Global Assessment - no abnormal movements, no bruit auscultated on the right, no bruit auscultated on the left, no decreased range of motion, non-tender. Trachea-midline. Thyroid Gland Characteristics - non-tender.  Eye Eyeball - Left-Extraocular movements intact, No Nystagmus. Eyeball - Right-Extraocular movements intact, No Nystagmus. Cornea - Left-No Hazy. Cornea - Right-No Hazy. Sclera/Conjunctiva - Left-No scleral icterus, No Discharge. Sclera/Conjunctiva - Right-No scleral icterus, No Discharge. Pupil - Left-Direct reaction to light normal. Pupil - Right-Direct reaction to light normal.  ENMT Ears Pinna - Left - no drainage observed, no generalized tenderness observed. Right - no drainage observed, no generalized tenderness observed. Nose and Sinuses External Inspection of the Nose - no destructive lesion observed. Inspection of the nares - Left - quiet respiration. Right - quiet respiration. Mouth and Throat Lips - Upper Lip - no fissures observed, no pallor noted. Lower Lip - no fissures observed, no pallor noted. Nasopharynx - no discharge present. Oral Cavity/Oropharynx - Tongue - no dryness observed. Oral Mucosa - no cyanosis observed. Hypopharynx - no evidence of airway distress observed.  Chest and Lung Exam Inspection Movements - Normal and Symmetrical. Accessory muscles - No use of accessory muscles in breathing. Palpation Palpation of the chest reveals - Non-tender. Auscultation Breath sounds -  Normal and Clear.  Cardiovascular Auscultation Rhythm - Regular. Murmurs & Other Heart Sounds - Auscultation of the heart reveals - No Murmurs and No Systolic Clicks.  Abdomen Inspection Inspection of the abdomen reveals - No Visible peristalsis and No Abnormal pulsations. Umbilicus - No Bleeding, No Urine drainage. Palpation/Percussion Palpation and Percussion of the abdomen reveal - Soft, Non Tender, No Rebound tenderness, No Rigidity (guarding) and No Cutaneous hyperesthesia. Note: Abdomen soft. Not severely distended. No distasis recti. No umbilical or other anterior abdominal wall hernias  Female Genitourinary Sexual Maturity Tanner  5 - Adult hair pattern. Note: a few warts on the left inferior labia. No vaginal bleeding nor discharge  Rectal Note: ` ` ` carpeting of 2-5 millimeter warts perianally and going up into anal canal. Sensitive. Probably about 30.  no pilonidal disease. No definite fissure. No abscess. Normal sphincter tone. no obvious distal rectal masses. Held off on anoscopy examination  Peripheral Vascular Upper Extremity Inspection - Left - No Cyanotic nailbeds, Not Ischemic. Right - No Cyanotic nailbeds, Not Ischemic.  Neurologic Neurologic evaluation reveals -normal attention span and ability to concentrate, able to name objects and repeat phrases. Appropriate fund of knowledge , normal sensation and normal coordination. Mental Status Affect - not angry, not paranoid. Cranial Nerves-Normal Bilaterally. Gait-Normal.  Neuropsychiatric Mental status exam performed with findings of-able to articulate well with normal speech/language, rate, volume and coherence, thought content normal with ability to perform basic computations and apply abstract reasoning and no evidence of hallucinations, delusions, obsessions or homicidal/suicidal ideation.  Musculoskeletal Global Assessment Spine, Ribs and Pelvis - no instability, subluxation or  laxity. Right Upper Extremity - no instability, subluxation or laxity.  Lymphatic Head & Neck  General Head & Neck Lymphatics: Bilateral - Description - No Localized lymphadenopathy. Axillary  General Axillary Region: Bilateral - Description - No Localized lymphadenopathy. Femoral & Inguinal  Generalized Femoral & Inguinal Lymphatics: Left - Description - No Localized lymphadenopathy. Right - Description - No Localized lymphadenopathy.    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 Too much to try and ablate in the office. Skeptical of topical agents being successful.  I recommended outpatient surgical ablation. Try to do laser ablation. Would do lithotomy so that vaginal warts can be treated as well.  The anatomy & physiology of the anorectal region was discussed. The pathophysiology of anorectal warts and differential diagnosis was discussed. Natural history risks without surgery was discussed such as further growth and cancer. I stressed the importance of office follow-up to catch early recurrence & minimize/halt progression of disease. Interventions such as cauterization or cryotherapy by topical agents were discussed.  The patient's symptoms are not adequately controlled by non-operative treatments. I feel the risks & problems of no surgery outweigh the operative risks; therefore, I recommended surgery to treat the anal warts by removal, ablation and/or cauterization.  Risks such as bleeding, infection, need for further treatment, heart attack, death, and other risks were discussed. I noted a good likelihood this will help address the problem. Goals of post-operative recovery were discussed as well. Possibility that this will not correct all symptoms was explained. Post-operative pain, bleeding, constipation, and other problems after surgery were discussed. We will  work to minimize complications. Educational handouts further explaining the pathology, treatment options, and bowel regimen were given as well. Questions were answered. The patient expresses understanding & wishes to proceed with surgery.   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 We'll do lithotomy  Adin Hector, M.D., F.A.C.S. Gastrointestinal and Minimally Invasive Surgery [facility] [address] [place], [place] [address] [phone] Main / Paging

## 2018-02-22 NOTE — Op Note (Signed)
02/22/2018  1:50 PM  PATIENT:  Joanna Hall  52 y.o. 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  Delay start of Pharmacological VTE agent (>24hrs) due to surgical blood loss or risk of bleeding:  no  DRAINS: none   SPECIMEN:  No Specimen  DISPOSITION OF SPECIMEN:  N/A  COUNTS:  YES  PLAN OF CARE: Discharge to home after PACU  PATIENT DISPOSITION:  PACU - hemodynamically stable.  INDICATION: Patient with perianal & labial condyloma.  Burden felt too much to treat in office.  Recommendation made for examination under anesthesia with ablation and possible removal of condyloma.    The anatomy & physiology of the anorectal region was discussed.  The pathophysiology of anorectal warts and differential diagnosis was discussed.  Natural history risks without surgery was discussed such as further growth and cancer.   I stressed the importance of office follow-up to catch early recurrence & minimize/halt progression of disease.  Interventions such as cauterization by topical agents were  discussed.  The patient's symptoms are not adequately controlled by non-operative treatments.  I feel the risks & problems of no surgery outweigh the operative risks; therefore, I recommended surgery to treat the anal warts by removal, ablation and/or cauterization.  Risks such as bleeding, infection, need for further treatment, heart attack, death, and other risks were discussed.   I noted a good likelihood this will help address the problem. Goals of post-operative recovery were discussed as well.  Possibility that this will not correct all symptoms was explained.  Post-operative pain, bleeding, constipation, and other problems after surgery were discussed.  We will work to minimize complications.   Educational handouts further explaining the pathology, treatment options, and bowel regimen were given as well.  Questions were answered.  The patient expresses understanding & wishes to proceed with surgery.  OR FINDINGS: Condyloma on bilateral upper and especially left posterior labia.  Perianal region carpeted with condyloma moderate size out to 2 cm.  Going into the anal canal, especially right and posterior aspects as well.  No fissure.  No fistula.  No significant hemorrhoidal disease.  No abscess.  DESCRIPTION:   Informed consent was confirmed. Patient underwent general anesthesia without difficulty. Patient was placed into lithotomy positioning.  The perineum & perianal region was prepped and draped in sterile fashion. Surgical time-out confirmed our plan.  I did external and digital rectal examination and then transitioned over to anoscopy to get a sense of the anatomy.  Findings noted above. I proceeded to ablate the condyloma using the CO2 laser.  Set to 24 W initially.  Started around the perineum.  Related some lesions along the upper labia close to but not on the clitoris.  Also on the posterior labia.  Throat is somewhat thickened and narrowed vagina.  No major stricturing though.  .  I  then for transition around the perianal region.  These were larger and thicker so increased to 10-15 W intermittently.  I worked peripherally and then into anal canal. .  I also did staining with acetic acid.   I  repeated inspectionAblated a few mildly suspicious areas on the labia as well as perianal and anal canal regions.  I used touch point needle tip cautery for hemostasis on a few oozing areas near the hemorrhoidal piles.  Sphincter tone intact.  No major stricturing.  No other major abnormalities noted.  I repeated anoscopy and examination.  Hemostasis was good.  Patient being extubated to go to the recovery room.  I had discussed postop care in detail with the patient in the preop holding area.  Instructions for post-operative recovery had been given in the office and moral be given at discharge.  Prescription for pain medicine written.  I am about to discuss the patient's status to her friend.  Adin Hector, M.D., F.A.C.S. Gastrointestinal and Minimally Invasive Surgery [facility] [address] [place], [place] [address] [phone] Main / Paging

## 2018-02-22 NOTE — Anesthesia Procedure Notes (Signed)
Procedure Name: LMA Insertion Date/Time: 02/22/2018 1:03 PM Performed by: Bonney Aid, CRNA Pre-anesthesia Checklist: Patient identified, Emergency Drugs available, Suction available and Patient being monitored Patient Re-evaluated:Patient Re-evaluated prior to induction Oxygen Delivery Method: Circle system utilized Preoxygenation: Pre-oxygenation with 100% oxygen Induction Type: IV induction Ventilation: Mask ventilation without difficulty LMA: LMA inserted LMA Size: 4.0 Number of attempts: 1 Airway Equipment and Method: Bite block Placement Confirmation: positive ETCO2 Tube secured with: Tape Dental Injury: Teeth and Oropharynx as per pre-operative assessment

## 2018-02-22 NOTE — Anesthesia Preprocedure Evaluation (Signed)
Anesthesia Evaluation  Patient identified by MRN, date of birth, ID band Patient awake    Reviewed: Allergy & Precautions, NPO status , Patient's Chart, lab work & pertinent test results  History of Anesthesia Complications Negative for: history of anesthetic complications  Airway Mallampati: III  TM Distance: >3 FB Neck ROM: Full    Dental no notable dental hx. (+) Teeth Intact   Pulmonary neg sleep apnea, neg COPD, former smoker,    Pulmonary exam normal breath sounds clear to auscultation- rhonchi (-) wheezing      Cardiovascular hypertension, Pt. 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 No date: Cancer [facility])     Comment:  cervix No date: GERD (gastroesophageal reflux disease) No date: Hepatitis C No date: History of cancer chemotherapy     Comment:  completed 06/2017 No date: Hypertension left kidney: Infarction of kidney (HCC) No date: Stiff neck     Comment:  limited right turn   Reproductive/Obstetrics                             Anesthesia Physical  Anesthesia Plan  ASA: II  Anesthesia Plan: General   Post-op Pain Management:    Induction: Intravenous  PONV Risk Score and Plan: 4 or greater and Ondansetron and Dexamethasone  Airway Management Planned:  LMA  Additional Equipment:   Intra-op Plan:   Post-operative Plan:   Informed Consent: I have reviewed the patients History and Physical, chart, labs and discussed the procedure including the risks, benefits and alternatives for the proposed anesthesia with the patient or authorized representative who has indicated his/her understanding and acceptance.   Dental advisory given  Plan Discussed with: CRNA and Surgeon  Anesthesia Plan Comments:         Anesthesia Quick Evaluation

## 2018-02-22 NOTE — Transfer of Care (Signed)
Immediate Anesthesia Transfer of Care Note  Patient: Joanna Hall  Procedure(s) Performed: LASER ABLATION/REMOVAL OF CONDOLAMATA AROUND ANUS AND VAGINA (N/A Perineum) ANORECTAL EXAM UNDER ANESTHESIA (N/A Rectum)  Patient Location: PACU  Anesthesia Type:General  Level of Consciousness: awake and oriented  Airway & Oxygen Therapy: Patient Spontanous Breathing and Patient connected to nasal cannula oxygen  Post-op Assessment: Report given to RN  Post vital signs: Reviewed and stable  Last Vitals:  Vitals Value Taken Time  BP 136/88 02/22/2018  2:03 PM  Temp 36.6 C 02/22/2018  2:03 PM  Pulse 65 02/22/2018  2:03 PM  Resp 14 02/22/2018  2:03 PM  SpO2 98 % 02/22/2018  2:03 PM    Last Pain:  Vitals:   02/22/18 0900  TempSrc: Oral         Complications: No apparent anesthesia complications

## 2018-02-22 NOTE — Discharge Instructions (Signed)
Recommended surgeon followup physical exam schedule for condyloma/warts: -every 6 months until negative exam x2, then -every Year until negative exam x2, then -as needed thereafter   Genital Warts Genital warts are a sexually transmitted infection. They may appear as small bumps on the tissues of the genital area. CAUSES  Genital warts are caused by a virus called human papillomavirus (HPV). HPV is the most common sexually transmitted disease (STD) and infection of the sex organs. This infection is spread by having unprotected sex with an infected person. It can be spread by vaginal, anal, and oral sex. Many people do not know they are infected. They may be infected for years without problems. However, even if they do not have problems, they can unknowingly pass the infection to their sexual partners. SYMPTOMS   Itching and irritation in the genital area.Anal wart   Warts that bleed.  Painful sexual intercourse. DIAGNOSIS  Warts are usually recognized with the naked eye on the vagina, vulva, perineum, anus, and rectum. Certain tests can also diagnose genital warts, such as:  A Pap test.  A tissue sample (biopsy) exam.  Colposcopy. A magnifying tool is used to examine the vagina and cervix. The HPV cells will change color when certain solutions are used. TREATMENT  Warts can be removed by:  Applying certain chemicals, such as cantharidin or podophyllin.  Liquid nitrogen freezing (cryotherapy).  Immunotherapy with candida or trichophyton injections.  Laser treatment.  Burning with an electrified probe (electrocautery).  Interferon injections.  Surgery. PREVENTION  HPV vaccination can help prevent HPV infections that cause genital warts and that cause cancer of the cervix. It is recommended that the vaccination be given to people between the ages 26 to 40 years old. The vaccine might not work as well or might not work at all if you already have HPV. It should not be given to  pregnant women. HOME CARE INSTRUCTIONS   It is important to follow your caregiver's instructions. The warts will not go away without treatment. Repeat treatments are often needed to get rid of warts. Even after it appears that the warts are gone, the normal tissue underneath often remains infected.  Do not try to treat genital warts with medicine used to treat hand warts. This type of medicine is strong and can burn the skin in the genital area, causing more damage.  Tell your past and current sexual partner(s) that you have genital warts. They may be infected also and need treatment.  Avoid sexual contact while being treated.  Do not touch or scratch the warts. The infection may spread to other parts of your body.  Women with genital warts should have a cervical cancer check (Pap test) at least once a year. This type of cancer is slow-growing and can be cured if found early. Chances of developing cervical cancer are increased with HPV.  Inform your obstetrician about your warts in the event of pregnancy. This virus can be passed to the baby's respiratory tract. Discuss this with your caregiver.  Use a condom during sexual intercourse. Following treatment, the use of condoms will help prevent reinfection.  Ask your caregiver about using over-the-counter anti-itch creams. SEEK MEDICAL CARE IF:   Your treated skin becomes red, swollen, or painful.  You have a fever.  You feel generally ill.  You feel little lumps in and around your genital area.  You are bleeding or have painful sexual intercourse. MAKE SURE YOU:   Understand these instructions.  Will watch your condition.  Will get help right away if you are not doing well or get worse.    ANORECTAL SURGERY:  POST OPERATIVE INSTRUCTIONS  ######################################################################  EAT Start with a pureed / full liquid diet After 24 hours, gradually transition to a high fiber diet.     CONTROL PAIN Control pain so you can tolerate bowel movements,  walk, sleep, tolerate sneezing/coughing, and go up/down stairs.   HAVE A BOWEL MOVEMENT DAILY Keep your bowels regular to avoid problems.   Taking a fiber supplement every day to keep bowels soft.   Try a laxative to override constipation. Use an antidairrheal to slow down diarrhea.   Call if not better after 2 tries  WALK Walk an hour a day.  Control your pain to do that.   CALL IF YOU HAVE PROBLEMS/CONCERNS Call if you are still struggling despite following these instructions. Call if you have concerns not answered by these instructions  ######################################################################    1. Take your usually prescribed home medications unless otherwise directed. 2. DIET: Follow a light bland diet the first 24 hours after arrival home, such as soup, liquids, crackers, etc.  Be sure to include lots of fluids daily.  Avoid fast food or heavy meals as your are more likely to get nauseated.  Eat a low fat the next few days after surgery.   3. PAIN CONTROL: a. Pain is best controlled by a usual combination of three different methods TOGETHER: i. Ice/Heat ii. Over the counter pain medication iii. Prescription pain medication b. Expect swelling and discomfort in the anus/rectal area.  Warm water baths (30-60 minutes up to 6 times a day, especially after bowel meovements) will help. Use ice for the first few days to help decrease swelling and bruising, then switch to heat such as warm towels, sitz baths, warm baths, etc to help relax tight/sore spots and speed recovery.  Some people prefer to use ice alone, heat alone, alternating between ice & heat.  Experiment to what works for you.   c. It is helpful to take an over-the-counter pain medication continuously for the first few weeks.  Choose one of the following that works best for you: i. Naproxen (Aleve, etc)  Two 245m tabs twice a  day ii. Ibuprofen (Advil, etc) Three 209mtabs four times a day (every meal & bedtime) iii. Acetaminophen (Tylenol, etc) 500-65028mour times a day (every meal & bedtime) d. A  prescription for pain medication (such as oxycodone, hydrocodone, etc) should be given to you upon discharge.  Take your pain medication as prescribed.  i. If you are having problems/concerns with the prescription medicine (does not control pain, nausea, vomiting, rash, itching, etc), please call us [phone] see if we need to switch you to a different pain medicine that will work better for you and/or control your side effect better. ii. If you need a refill on your pain medication, please contact your pharmacy.  They will contact our office to request authorization. Prescriptions will not be filled after 5 pm or on week-ends.  If can take up to 48 hours for it to be filled & ready so avoid waiting until you are down to thel ast pill. e. A topical cream (Dibucaine) or a prescription for a cream (such as diltiazem 2% gel) may be given to you.  Many people find relief with topical creams.  Some people find it burns too much.  Experiment.  If it helps, use it.  If it burns, don't using it.  Use a Sitz Bath 4-8 times a day for relief   [employer] A sitz bath is a warm water bath taken in the sitting position that covers only the hips and buttocks. It may be used for either healing or hygiene purposes. Sitz baths are also used to relieve pain, itching, or muscle spasms. The water may contain medicine. Moist heat will help you heal and relax.  HOME CARE INSTRUCTIONS  Take 3 to 4 sitz baths a day. 1. Fill the bathtub half full with warm water. 2. Sit in the water and open the drain a little. 3. Turn on the warm water to keep the tub half full. Keep the water running constantly. 4. Soak in the water for 15 to 20 minutes. 5. After the sitz bath, pat the affected area dry first.   4. KEEP YOUR BOWELS REGULAR a. The goal  is one soft bowel movement a day b. Avoid getting constipated.  Between the surgery and the pain medications, it is common to experience some constipation.  Increasing fluid intake and taking a fiber supplement (such as Metamucil, Citrucel, FiberCon, MiraLax, etc) 2-3 times a day regularly will usually help prevent this problem from occurring.  A mild laxative (prune juice, Milk of Magnesia, MiraLax, etc) should be taken according to package directions if there are no bowel movements after 48 hours. c. Watch out for diarrhea.  If you have many loose bowel movements, simplify your diet to bland foods & liquids for a few days.  Stop any stool softeners and decrease your fiber supplement.  Switching to mild anti-diarrheal medications (Kayopectate, Pepto Bismol) can help.  Can try an imodium/loperamide dose.  If this worsens or does not improve, please call us.  5. Wound Care  a. Remove your bandages with your first bowel movement, usually the day after surgery.  You may have packing if you had an abscess.  Let any packing or gauze fall come out.   b. Wear an absorbent pad or soft cotton balls in your underwear as needed to catch any drainage and help keep the area  c. Keep the area clean and dry.  Bathe / shower every day.  Keep the area clean by showering / bathing over the incision / wound.   It is okay to soak an open wound to help wash it.  Consider using a squeeze bottle filled with warm water to gently wash the anal area.  Wet wipes or showers / gentle washing after bowel movements is often less traumatic than regular toilet paper. d. Dennis Bast will often notice bleeding with bowel movements.  This should slow down by the end of the first week of surgery.  Sitting on an ice pack can help. e. Expect some drainage.  This should slow down by the end of the first week of surgery, but you will have occasional bleeding or drainage up to a few months after surgery.  Wear an absorbent pad or soft cotton gauze in your  underwear until the drainage stops.  6. ACTIVITIES as tolerated:   a. You may resume regular (light) daily activities beginning the next day--such as daily self-care, walking, climbing stairs--gradually increasing activities as tolerated.  If you can walk 30 minutes without difficulty, it is safe to try more intense activity such as jogging, treadmill, bicycling, low-impact aerobics, swimming, etc. b. Save the most intensive and strenuous activity for last such as sit-ups, heavy lifting, contact sports, etc  Refrain from any heavy lifting or straining until you  are off narcotics for pain control.   c. DO NOT PUSH THROUGH PAIN.  Let pain be your guide: If it hurts to do something, don't do it.  Pain is your body warning you to avoid that activity for another week until the pain goes down. d. You may drive when you are no longer taking prescription pain medication, you can comfortably sit for long periods of time, and you can safely maneuver your car and apply brakes. e. Dennis Bast may have sexual intercourse when it is comfortable.  7. FOLLOW UP in our office a. Please call [facility] at [phone] to set up an appointment to see your surgeon in the office for a follow-up appointment approximately 2-3 weeks after your surgery. b. Make sure that you call for this appointment the day you arrive home to ensure a convenient appointment time.  8. IF YOU HAVE DISABILITY OR FAMILY LEAVE FORMS, BRING THEM TO THE OFFICE FOR PROCESSING.  DO NOT GIVE THEM TO YOUR DOCTOR.        WHEN TO CALL us [phone]: 1. Poor pain control 2. Reactions / problems with new medications (rash/itching, nausea, etc)  3. Fever over 101.5 F (38.5 C) 4. Inability to urinate 5. Nausea and/or vomiting 6. Worsening swelling or bruising 7. Continued bleeding from incision. 8. Increased pain, redness, or drainage from the incision  The clinic staff is available to answer your questions during regular business hours (8:30am-5pm).   Please dont hesitate to call and ask to speak to one of our nurses for clinical concerns.   A surgeon from [facility] is always on call at the hospitals   If you have a medical emergency, go to the nearest emergency room or call 911.    [facility], [address], [place], [place], [place]  [address] ? MAIN: [phone] ? TOLL FREE: [phone] ? FAX [phone] [email or web address]   Post Anesthesia Home Care Instructions  Activity: Get plenty of rest for the remainder of the day. A responsible individual must stay with you for 24 hours following the procedure.  For the next 24 hours, DO NOT: -Drive a car -Paediatric nurse -Drink alcoholic beverages -Take any medication unless instructed by your physician -Make any legal decisions or sign important papers.  Meals: Start with liquid foods such as gelatin or soup. Progress to regular foods as tolerated. Avoid greasy, spicy, heavy foods. If nausea and/or vomiting occur, drink only clear liquids until the nausea and/or vomiting subsides. Call your physician if vomiting continues.  Special Instructions/Symptoms: Your throat may feel dry or sore from the anesthesia or the breathing tube placed in your throat during surgery. If this causes discomfort, gargle with warm salt water. The discomfort should disappear within 24 hours.  If you had a scopolamine patch placed behind your ear for the management of post- operative nausea and/or vomiting:  1. The medication in the patch is effective for 72 hours, after which it should be removed.  Wrap patch in a tissue and discard in the trash. Wash hands thoroughly with soap and water. 2. You may remove the patch earlier than 72 hours if you experience unpleasant side effects which may include dry mouth, dizziness or visual disturbances. 3. Avoid touching the patch. Wash your hands with soap and water after contact with the patch.

## 2018-02-24 NOTE — Anesthesia Postprocedure Evaluation (Signed)
Anesthesia Post Note  Patient: Joanna Hall  Procedure(s) Performed: LASER ABLATION/REMOVAL OF CONDOLAMATA AROUND ANUS AND VAGINA (N/A Perineum) ANORECTAL EXAM UNDER ANESTHESIA (N/A Rectum)     Patient location during evaluation: PACU Anesthesia Type: General Level of consciousness: awake Pain management: pain level controlled Vital Signs Assessment: post-procedure vital signs reviewed and stable Respiratory status: spontaneous breathing Cardiovascular status: stable Postop Assessment: no apparent nausea or vomiting Anesthetic complications: no    Last Vitals:  Vitals:   02/22/18 1430 02/22/18 1505  BP: (!) 127/59 123/67  Pulse: (!) 52 (!) 53  Resp: 15 16  Temp:  36.6 C  SpO2: 100% 100%    Last Pain:  Vitals:   02/22/18 1505  TempSrc:   PainSc: 0-No pain   Pain Goal:                 Oluwaseyi Raffel JR,JOHN Madline Oesterling

## 2018-02-25 ENCOUNTER — Ambulatory Visit: Payer: Medicaid Other | Admitting: Physical Therapy

## 2018-02-25 ENCOUNTER — Encounter (HOSPITAL_BASED_OUTPATIENT_CLINIC_OR_DEPARTMENT_OTHER): Payer: Self-pay | Admitting: Surgery

## 2018-02-26 ENCOUNTER — Ambulatory Visit
Admission: RE | Admit: 2018-02-26 | Discharge: 2018-02-26 | Disposition: A | Discharge status: Home / Self Care | Payer: Medicaid Other | Source: Ambulatory Visit | Attending: Orthopedic Surgery | Admitting: Orthopedic Surgery

## 2018-02-26 DIAGNOSIS — M1712 Unilateral primary osteoarthritis, left knee: Secondary | ICD-10-CM | POA: Insufficient documentation

## 2018-02-26 DIAGNOSIS — S83282A Other tear of lateral meniscus, current injury, left knee, initial encounter: Secondary | ICD-10-CM | POA: Diagnosis not present

## 2018-02-26 DIAGNOSIS — M7122 Synovial cyst of popliteal space [Baker], left knee: Secondary | ICD-10-CM | POA: Diagnosis not present

## 2018-02-26 DIAGNOSIS — M25562 Pain in left knee: Secondary | ICD-10-CM | POA: Diagnosis present

## 2018-02-26 DIAGNOSIS — X58XXXA Exposure to other specified factors, initial encounter: Secondary | ICD-10-CM | POA: Insufficient documentation

## 2018-02-27 ENCOUNTER — Ambulatory Visit: Payer: Medicaid Other | Admitting: Physical Therapy

## 2018-02-28 ENCOUNTER — Encounter: Payer: Self-pay | Admitting: Family Medicine

## 2018-02-28 ENCOUNTER — Ambulatory Visit: Payer: Medicaid Other | Admitting: Family Medicine

## 2018-02-28 VITALS — BP 108/62 | HR 79 | Temp 98.2°F | Resp 14 | Ht 67.0 in | Wt 164.9 lb

## 2018-02-28 DIAGNOSIS — M1711 Unilateral primary osteoarthritis, right knee: Secondary | ICD-10-CM

## 2018-02-28 DIAGNOSIS — A630 Anogenital (venereal) warts: Secondary | ICD-10-CM

## 2018-02-28 DIAGNOSIS — I10 Essential (primary) hypertension: Secondary | ICD-10-CM

## 2018-02-28 DIAGNOSIS — M1712 Unilateral primary osteoarthritis, left knee: Secondary | ICD-10-CM | POA: Diagnosis not present

## 2018-02-28 DIAGNOSIS — B182 Chronic viral hepatitis C: Secondary | ICD-10-CM

## 2018-02-28 DIAGNOSIS — I700 Atherosclerosis of aorta: Secondary | ICD-10-CM

## 2018-02-28 NOTE — Progress Notes (Signed)
BP 108/62   Pulse 79   Temp 98.2 F (36.8 C) (Oral)   Resp 14   Ht 5' 7"  (1.702 m)   Wt 164 lb 14.4 oz (74.8 kg)   SpO2 97%   BMI 25.83 kg/m    Subjective:    Patient ID: Joanna Hall, female    DOB: Dec 07, 1965, 52 y.o.   MRN: [id number]  HPI: Joanna Hall is a 52 y.o. female  Chief Complaint  Patient presents with  . Follow-up    HPI Patient is here for f/u She recently underwent treatment for condyloma acuminatum of the anus She also had a trip to the ER on 02/21/18 for acute pain in her right shoulder She saw Dr. Ancil Boozer, my colleague here, on 02/11/18 for a productive cough Her last visit with me was on 01/15/18 when she established care at our practice  Both knees need replacements, and ortho did MRI last week; they are going to a total knee replacement in July; does have chronic pain in the knees, left worse than right; that's why that one will be first  Cough went away after z-pak when she saw colleague  HTN; well-controlled; not checking BP away from office; not adding salt  Factor V Leiden; on Xarelto  Aortic athero; former smoking, quit 2009 Lab Results  Component Value Date   CHOL 149 02/11/2018   HDL 68 02/11/2018   LDLCALC 28 02/11/2018   TRIG 266 (H) 02/11/2018   CHOLHDL 2.2 02/11/2018   She has the medicine for hepatitis C; Dr. Marius Ditch wanted her to start it after the surgery; she'll call Dr. Marius Ditch to see if okay to wait until after knee surgery  Depression screen [facility] 2/9 02/28/2018 01/15/2018  Decreased Interest 0 0  Down, Depressed, Hopeless 0 1  PHQ - 2 Score 0 1    Relevant past medical, surgical, family and social history reviewed Past Medical History:  Diagnosis Date  . Anxiety   . Aortic atherosclerosis ([place])   . Arthritis    neck and knees  . 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 Blood transfusion without reported diagnosis   . Cervical cancer ([place])   . Chronic anal fissure   . Chronic diarrhea   . Dyspnea    . Factor V Leiden mutation ([place])   . Fecal incontinence   . Genital warts   . GERD (gastroesophageal reflux disease)   . Heart murmur   . Hemorrhoids   . 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 History of cancer chemotherapy    completed 06/2017  . History of Clostridium difficile infection    while undergoing chemo.  Negative test 10/2017  . Hypertension    during chemo treatments  . 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 Macrocytic anemia with vitamin B12 deficiency   . Perianal condylomata   . Pneumonia    History of  . Pulmonary nodules   . Small bowel obstruction ([place]) 08/2017  . Stiff neck    limited right turn  . Vitamin D deficiency    Past Surgical History:  Procedure Laterality Date  . CHOLECYSTECTOMY    . COLON SURGERY  08/2017   resection  . COLONOSCOPY WITH PROPOFOL N/A 12/20/2017   Procedure: COLONOSCOPY WITH PROPOFOL;  Surgeon: [name], MD;  Location: [facility] ENDOSCOPY;  Service: Gastroenterology;  Laterality: N/A;  .  DIAGNOSTIC LAPAROSCOPY    . ESOPHAGOGASTRODUODENOSCOPY (EGD) WITH PROPOFOL N/A 12/20/2017   Procedure: ESOPHAGOGASTRODUODENOSCOPY (EGD) WITH PROPOFOL;  Surgeon: [name], MD;  Location: [facility];  Service: Gastroenterology;  Laterality: N/A;  . LAPAROTOMY N/A 08/31/2017   Procedure: EXPLORATORY LAPAROTOMY for SBO, ileocolectomy, removal of piece of uterine wall;  Surgeon: [name], MD;  Location: [facility] ORS;  Service: General;  Laterality: N/A;  . LASER ABLATION CONDOLAMATA N/A 02/22/2018   Procedure: LASER ABLATION/REMOVAL OF JJHERDEYCXK AROUND ANUS AND VAGINA;  Surgeon: Michael Boston, MD;  Location: [facility];  Service: General;  Laterality: N/A;  . OOPHORECTOMY    . SMALL INTESTINE SURGERY    . TANDEM RING INSERTION     x3  . THORACOTOMY     Family History  Problem Relation Age of Onset  . Hypertension Father   . Diabetes Father   . Alcohol abuse  Daughter   . Hypertension Maternal Grandmother   . Diabetes Maternal Grandmother   . Diabetes Paternal Grandmother   . Hypertension Paternal Grandmother    Social History   Tobacco Use  . Smoking status: Former Smoker    Last attempt to quit: 10/16/2006    Years since quitting: 11.3  . Smokeless tobacco: Never Used  Substance Use Topics  . Alcohol use: Yes    Frequency: Never    Comment: seldom  . Drug use: No    Interim medical history since last visit reviewed. Allergies and medications reviewed  Review of Systems Per HPI unless specifically indicated above     Objective:    BP 108/62   Pulse 79   Temp 98.2 F (36.8 C) (Oral)   Resp 14   Ht 5' 7"  (1.702 m)   Wt 164 lb 14.4 oz (74.8 kg)   SpO2 97%   BMI 25.83 kg/m   Wt Readings from Last 3 Encounters:  02/28/18 164 lb 14.4 oz (74.8 kg)  02/22/18 166 lb 4.8 oz (75.4 kg)  02/11/18 169 lb 6.4 oz (76.8 kg)    Physical Exam  Constitutional: She appears well-developed and well-nourished. No distress.  HENT:  Head: Normocephalic and atraumatic.  Eyes: EOM are normal. No scleral icterus.  Neck: No thyromegaly present.  Cardiovascular: Normal rate, regular rhythm and normal heart sounds.  No murmur heard. Pulmonary/Chest: Effort normal and breath sounds normal. No respiratory distress. She has no wheezes.  Abdominal: Soft. Bowel sounds are normal. She exhibits no distension.  Musculoskeletal: Normal range of motion. She exhibits no edema.  Neurological: She is alert. She exhibits normal muscle tone.  Skin: Skin is warm. She is not diaphoretic. No pallor.  Psychiatric: She has a normal mood and affect.    Results for orders placed or performed during the hospital encounter of 02/11/18  Lipid panel  Result Value Ref Range   Cholesterol 149 0 - 200 mg/dL   Triglycerides 266 (H) <150 mg/dL   HDL 68 >40 mg/dL   Total CHOL/HDL Ratio 2.2 RATIO   VLDL 53 (H) 0 - 40 mg/dL   LDL Cholesterol 28 0 - 99 mg/dL  Vitamin  B12  Result Value Ref Range   Vitamin B-12 319 180 - 914 pg/mL      Assessment & Plan:   Problem List Items Addressed This Visit      Cardiovascular and Mediastinum   Essential hypertension (Chronic)    Well-controlled      Aortic atherosclerosis (HCC) - Primary (Chronic)    LDL is well suppressed, hoping  for plaque regression        Digestive   Condyloma acuminatum of anus s/p ablation 02/22/2018    Stable, going for follow-up with surgeon; I asked patient to please contact surgeon for pain medicine, as I won't be able to prescribe any for her post-procedure pain      Chronic hepatitis C without hepatic coma ([place]) (Chronic)    Patient is going to start medicine for treatment, but is concerned about possibly missing a day when she has her knee surgery; she will talk to GI specialist about whether she should wait until after her knee surgery, or go ahead and start but risk missing a day        Musculoskeletal and Integument   Osteoarthritis of right knee   Osteoarthritis of left knee    Patient expecting total knee replacement in July; we'll be glad to see her back for pre-operative clearance within 30 days of the planned procedure        Genitourinary   Condyloma acuminatum of vagina s/p ablation 02/22/2018       Follow up plan: Return in about 5 months (around 08/14/2018) for twenty minute follow-up with fasting labs; surgical clearance 3 weeks prior to surgery.  An after-visit summary was printed and given to the patient at [facility].  Please see the patient instructions which may contain other information and recommendations beyond what is mentioned above in the assessment and plan.  No orders of the defined types were placed in this encounter.   No orders of the defined types were placed in this encounter.

## 2018-02-28 NOTE — Assessment & Plan Note (Signed)
Well controlled 

## 2018-02-28 NOTE — Assessment & Plan Note (Signed)
Patient is going to start medicine for treatment, but is concerned about possibly missing a day when she has her knee surgery; she will talk to GI specialist about whether she should wait until after her knee surgery, or go ahead and start but risk missing a day

## 2018-02-28 NOTE — Assessment & Plan Note (Signed)
Patient expecting total knee replacement in July; we'll be glad to see her back for pre-operative clearance within 30 days of the planned procedure

## 2018-02-28 NOTE — Assessment & Plan Note (Addendum)
Stable, going for follow-up with surgeon; I asked patient to please contact surgeon for pain medicine, as I won't be able to prescribe any for her post-procedure pain

## 2018-02-28 NOTE — Assessment & Plan Note (Signed)
LDL is well suppressed, hoping for plaque regression

## 2018-03-01 ENCOUNTER — Ambulatory Visit: Admission: RE | Admit: 2018-03-01 | Payer: Medicaid Other | Source: Ambulatory Visit

## 2018-03-04 ENCOUNTER — Encounter: Payer: Medicaid Other | Admitting: Physical Therapy

## 2018-03-05 ENCOUNTER — Encounter: Payer: Self-pay | Admitting: Gastroenterology

## 2018-03-06 ENCOUNTER — Ambulatory Visit: Admission: RE | Admit: 2018-03-06 | Payer: Medicaid Other | Source: Ambulatory Visit

## 2018-03-06 ENCOUNTER — Encounter: Payer: Medicaid Other | Admitting: Physical Therapy

## 2018-03-07 ENCOUNTER — Ambulatory Visit: Payer: Self-pay | Admitting: Orthopedic Surgery

## 2018-03-07 ENCOUNTER — Other Ambulatory Visit: Payer: Medicaid Other

## 2018-03-07 ENCOUNTER — Ambulatory Visit: Payer: Medicaid Other | Admitting: Hematology and Oncology

## 2018-03-12 ENCOUNTER — Other Ambulatory Visit: Payer: Self-pay | Admitting: *Deleted

## 2018-03-12 ENCOUNTER — Encounter: Payer: Medicaid Other | Admitting: Physical Therapy

## 2018-03-12 DIAGNOSIS — C530 Malignant neoplasm of endocervix: Secondary | ICD-10-CM

## 2018-03-13 ENCOUNTER — Inpatient Hospital Stay (HOSPITAL_BASED_OUTPATIENT_CLINIC_OR_DEPARTMENT_OTHER): Payer: Medicaid Other | Admitting: Hematology and Oncology

## 2018-03-13 ENCOUNTER — Encounter: Payer: Self-pay | Admitting: Hematology and Oncology

## 2018-03-13 ENCOUNTER — Inpatient Hospital Stay: Payer: Medicaid Other | Attending: Hematology and Oncology

## 2018-03-13 VITALS — BP 129/81 | HR 85 | Temp 97.2°F | Resp 16 | Wt 166.0 lb

## 2018-03-13 DIAGNOSIS — Z87891 Personal history of nicotine dependence: Secondary | ICD-10-CM

## 2018-03-13 DIAGNOSIS — D6851 Activated protein C resistance: Secondary | ICD-10-CM | POA: Diagnosis not present

## 2018-03-13 DIAGNOSIS — Z9221 Personal history of antineoplastic chemotherapy: Secondary | ICD-10-CM

## 2018-03-13 DIAGNOSIS — R918 Other nonspecific abnormal finding of lung field: Secondary | ICD-10-CM | POA: Diagnosis not present

## 2018-03-13 DIAGNOSIS — Z79899 Other long term (current) drug therapy: Secondary | ICD-10-CM

## 2018-03-13 DIAGNOSIS — F419 Anxiety disorder, unspecified: Secondary | ICD-10-CM | POA: Diagnosis not present

## 2018-03-13 DIAGNOSIS — K739 Chronic hepatitis, unspecified: Secondary | ICD-10-CM

## 2018-03-13 DIAGNOSIS — C539 Malignant neoplasm of cervix uteri, unspecified: Secondary | ICD-10-CM

## 2018-03-13 DIAGNOSIS — B1920 Unspecified viral hepatitis C without hepatic coma: Secondary | ICD-10-CM

## 2018-03-13 DIAGNOSIS — Z8541 Personal history of malignant neoplasm of cervix uteri: Secondary | ICD-10-CM | POA: Insufficient documentation

## 2018-03-13 DIAGNOSIS — Z923 Personal history of irradiation: Secondary | ICD-10-CM

## 2018-03-13 DIAGNOSIS — C530 Malignant neoplasm of endocervix: Secondary | ICD-10-CM

## 2018-03-13 DIAGNOSIS — E538 Deficiency of other specified B group vitamins: Secondary | ICD-10-CM | POA: Insufficient documentation

## 2018-03-13 DIAGNOSIS — I10 Essential (primary) hypertension: Secondary | ICD-10-CM | POA: Insufficient documentation

## 2018-03-13 DIAGNOSIS — Z7901 Long term (current) use of anticoagulants: Secondary | ICD-10-CM | POA: Diagnosis not present

## 2018-03-13 LAB — CBC WITH DIFFERENTIAL/PLATELET
Basophils Absolute: 0 10*3/uL (ref 0–0.1)
Basophils Relative: 0 %
Eosinophils Absolute: 0.1 10*3/uL (ref 0–0.7)
Eosinophils Relative: 2 %
HCT: 36.2 % (ref 35.0–47.0)
Hemoglobin: 12.7 g/dL (ref 12.0–16.0)
Lymphocytes Relative: 24 %
Lymphs Abs: 0.9 10*3/uL — ABNORMAL LOW (ref 1.0–3.6)
MCH: 34.5 pg — ABNORMAL HIGH (ref 26.0–34.0)
MCHC: 35.1 g/dL (ref 32.0–36.0)
MCV: 98.3 fL (ref 80.0–100.0)
Monocytes Absolute: 0.2 10*3/uL (ref 0.2–0.9)
Monocytes Relative: 6 %
Neutro Abs: 2.5 10*3/uL (ref 1.4–6.5)
Neutrophils Relative %: 68 %
Platelets: 193 10*3/uL (ref 150–440)
RBC: 3.68 MIL/uL — ABNORMAL LOW (ref 3.80–5.20)
RDW: 12.4 % (ref 11.5–14.5)
WBC: 3.7 10*3/uL (ref 3.6–11.0)

## 2018-03-13 LAB — COMPREHENSIVE METABOLIC PANEL
ALT: 38 U/L (ref 14–54)
AST: 36 U/L (ref 15–41)
Albumin: 3.9 g/dL (ref 3.5–5.0)
Alkaline Phosphatase: 147 U/L — ABNORMAL HIGH (ref 38–126)
Anion gap: 8 (ref 5–15)
BUN: 13 mg/dL (ref 6–20)
CO2: 24 mmol/L (ref 22–32)
Calcium: 8.9 mg/dL (ref 8.9–10.3)
Chloride: 103 mmol/L (ref 101–111)
Creatinine, Ser: 0.68 mg/dL (ref 0.44–1.00)
GFR calc Af Amer: >60 mL/min (ref >60)
GFR calc non Af Amer: >60 mL/min (ref >60)
Glucose, Bld: 114 mg/dL — ABNORMAL HIGH (ref 65–99)
Potassium: 3.5 mmol/L (ref 3.5–5.1)
Sodium: 135 mmol/L (ref 135–145)
Total Bilirubin: 0.5 mg/dL (ref 0.3–1.2)
Total Protein: 7.7 g/dL (ref 6.5–8.1)

## 2018-03-13 MED ORDER — RIVAROXABAN 20 MG PO TABS: 20.0000 mg | ORAL_TABLET | Freq: Every day | ORAL | 1 refills | Status: DC

## 2018-03-13 NOTE — Progress Notes (Signed)
Pt reports having surgery at [facility] for condylomata 3 weeks ago.  Pt reports continued loose stools multiple times a day.  Uses Immodium.

## 2018-03-13 NOTE — Progress Notes (Signed)
[facility] day:  03/13/2018  Chief Complaint: Joanna Hall is a 52 y.o. female with a history of cervical cancer s/p chemoradiation who is seen for 3 month assessment.  HPI:  The patient was last seen in the medical oncology clinic on 12/11/2017.  At that time, she felt better.  LLQ pain had improved from a level 10 to a level 3.  Exam revealed minimal LLQ discomfort on palpation. She wa scheduled to see Dr. Marius Ditch.  She continued B12 and Xarelto.  She was seen by Dr. Marius Ditch on 12/12/2017.  She recommended treatment for hepatitis C with Epclusa x 12 weeks or Mayret x 8 weeks.  Her diarrhea was felt secondary to bile salt induced diarrhea with a history of ileocolectomy.  Cholestyramine was to continue with dicyclomine for cramps.  Lower abdominal pain was felt secondary to scar tissue.  Colonoscopy was planned. Anal fissure and hemorrhoids had improved.  EGD on 12/20/2017 revealed normal duodenal bulb and second portion of duodenum.  Stomach was erythematous.  GE junction and esophagus were normal. Colonoscopy on 12/20/2017 revealed perianal condylomata and patent end-to-end ileo-colonic anastomosis, and multiple non-bleeding colonic angioectasias.  She underwent removal of condylomata by Dr Michael Boston on 02/22/2018. Surgery went well.  Symptomatically, she feels "ok".  She has frequent bowel movements.  She is scheduled to start [place] for hepatitis C .  She states that she can't miss a dose.  She is scheduled for knee surgery on 04/24/2018.   Past Medical History:  Diagnosis Date  . Anxiety   . Aortic atherosclerosis ([place])   . Arthritis    neck and knees  . 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 Blood transfusion without reported diagnosis   . Cervical cancer ([place])   . Chronic anal fissure   . Chronic diarrhea   . Dyspnea   . Factor V Leiden mutation ([place])   . Fecal incontinence   . Genital warts   . GERD  (gastroesophageal reflux disease)   . Heart murmur   . Hemorrhoids   . 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 History of cancer chemotherapy    completed 06/2017  . History of Clostridium difficile infection    while undergoing chemo.  Negative test 10/2017  . Hypertension    during chemo treatments  . 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 Macrocytic anemia with vitamin B12 deficiency   . Perianal condylomata   . Pneumonia    History of  . Pulmonary nodules   . Small bowel obstruction ([place]) 08/2017  . Stiff neck    limited right turn  . Vitamin D deficiency     Past Surgical History:  Procedure Laterality Date  . CHOLECYSTECTOMY    . COLON SURGERY  08/2017   resection  . COLONOSCOPY WITH PROPOFOL N/A 12/20/2017   Procedure: COLONOSCOPY WITH PROPOFOL;  Surgeon: [name], MD;  Location[facility] ENDOSCOPY;  Service: Gastroenterology;  Laterality: N/A;  . DIAGNOSTIC LAPAROSCOPY    . ESOPHAGOGASTRODUODENOSCOPY (EGD) WITH PROPOFOL N/A 12/20/2017   Procedure: ESOPHAGOGASTRODUODENOSCOPY (EGD) WITH PROPOFOL;  Surgeon: [name], MD;  Location: [facility];  Service: Gastroenterology;  Laterality: N/A;  . LAPAROTOMY N/A 08/31/2017   Procedure: EXPLORATORY LAPAROTOMY for SBO, ileocolectomy, removal of piece of uterine wall;  Surgeon: [name], MD;  Location: [facility] ORS;  Service:  General;  Laterality: N/A;  . LASER ABLATION CONDOLAMATA N/A 02/22/2018   Procedure: LASER ABLATION/REMOVAL OF CONDOLAMATA AROUND ANUS AND VAGINA;  Surgeon: Michael Boston, MD;  Location: [facility];  Service: General;  Laterality: N/A;  . OOPHORECTOMY    . SMALL INTESTINE SURGERY    . TANDEM RING INSERTION     x3  . THORACOTOMY      Family History  Problem Relation Age of Onset  . Hypertension Father   . Diabetes Father   . Alcohol abuse Daughter   . Hypertension Maternal Grandmother   . Diabetes Maternal Grandmother   .  Diabetes Paternal Grandmother   . Hypertension Paternal Grandmother     Social History:  reports that she quit smoking about 11 years ago. She has never used smokeless tobacco. She reports that she drinks alcohol. She reports that she does not use drugs.  The patient's 68 year old daughter died of alcoholism.  The patient is originally from [place].  She moved from [place] to [place] 2 weeks ago.  She is alone today.  Allergies:  Allergies  Allergen Reactions  . Ketamine Other (See Comments) and Anxiety    Unknown but given during previous cancer treatment and passed out after getting med  Other reaction(s): Confusion    Current Medications: Current Outpatient Medications  Medication Sig Dispense Refill  . ALOE VERA CONCENTRATE PO Take by mouth.    . cholestyramine (QUESTRAN) 4 g packet Take 1 packet (4 g total) by mouth 3 (three) times daily. (Patient taking differently: Take 4 g by mouth 2 (two) times daily. ) 270 packet 0  . cyanocobalamin (,VITAMIN B-12,) 1000 MCG/ML injection Inject 1ML every week x 4 weeks, then 1ML every other week x 2 months, then monthly x 3 months 10 mL 1  . dicyclomine (BENTYL) 10 MG capsule Take 1 capsule (10 mg total) by mouth 4 (four) times daily -  before meals and at bedtime. 360 capsule 1  . Digestive Enzymes (ENZYME DIGEST PO) Take by mouth.    . fluticasone (FLONASE) 50 MCG/ACT nasal spray Place 2 sprays into both nostrils daily. 16 g 0  . hydrocortisone (ANUSOL-HC) 2.5 % rectal cream Place 1 application rectally 2 (two) times daily. 30 g 0  . loperamide (IMODIUM A-D) 2 MG tablet Take 2 mg by mouth 4 (four) times daily as needed for diarrhea or loose stools.    . Multiple Vitamin (MULTIVITAMIN WITH MINERALS) TABS tablet Take 1 tablet daily by mouth.    . Probiotic Product (PROBIOMAX DAILY DF PO) Take by mouth.    . rivaroxaban (XARELTO) 20 MG TABS tablet Take 1 tablet (20 mg total) by mouth daily with supper. 30 tablet 1  .  Syringe/Needle, Disp, (SYRINGE 3CC/22GX1") 22G X 1" 3 ML MISC For use with Vitamin B12 injections 50 each 0  . VENTOLIN HFA 108 (90 Base) MCG/ACT inhaler INHALE 1-2 PUFFS BY MOUTH EVERY 4 HOURS AS NEEDED FOR WHEEZING / SHORTNESS OF BREATH  0  . cyclobenzaprine (FLEXERIL) 10 MG tablet Take 1 tablet (10 mg total) by mouth 3 (three) times daily as needed. (Patient not taking: Reported on 03/13/2018) 30 tablet 0  . loratadine (CLARITIN) 10 MG tablet Take 1 tablet (10 mg total) by mouth daily. (Patient not taking: Reported on 03/13/2018) 30 tablet 11  . oxyCODONE (OXY IR/ROXICODONE) 5 MG immediate release tablet Take 1-2 tablets (5-10 mg total) by mouth every 6 (six) hours as needed for moderate pain,  severe pain or breakthrough pain. (Patient not taking: Reported on 03/13/2018) 30 tablet 0   No current facility-administered medications for this visit.     Review of Systems:  GENERAL:  Feels "ok".  No fevers, sweats.Weightup 11 pounds. PERFORMANCE STATUS (ECOG):  1-2 HEENT:  No visual changes, runny nose, sore throat, mouth sores or tenderness. Lungs: No shortness of breath or cough.  No hemoptysis. Cardiac:  No chest pain, palpitations, orthopnea, or PND. GI:  "Stool still loose on powder".  No nausea, vomiting, diarrhea, constipation, melena or hematochezia. GU:  No urgency, frequency, dysuria, or hematuria. 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  Plans to have knee surgery 04/24/2018.  No muscle tenderness. Extremities:  No pain or swelling. Skin:  No rashes or skin changes. Neuro:  No headache, numbness or weakness, balance or coordination issues. Endocrine:  No diabetes, thyroid issues, hot flashes or night sweats. Psych:  No mood changes, depression or anxiety. Pain:  No focal pain. Review of systems:  All other systems reviewed and found to be negative.   Physical Exam: Blood pressure 129/81, pulse 85, temperature (!) 97.2 F (36.2 C), temperature source Tympanic, resp.  rate 16, weight 166 lb 0.1 oz (75.3 kg). GENERAL:  Well developed, well nourished, woman sitting comfortably in the exam room in no acute distress. MENTAL STATUS:  Alert and oriented to person, place and time. HEAD: Long brown hair pulled up.  Normocephalic, atraumatic, face symmetric, no Cushingoid features. EYES:  Blue eyes.  Pupils equal round and reactive to light and accomodation.  No conjunctivitis or scleral icterus. ENT:  Oropharynx clear without lesion.  Tongue normal. Mucous membranes moist.  RESPIRATORY:  Clear to auscultation without rales, wheezes or rhonchi. CARDIOVASCULAR:  Regular rate and rhythm without murmur, rub or gallop. ABDOMEN:  Soft, slightly tender LLQ without guarding or rebound tenderness.  Active bowel sounds, and no hepatosplenomegaly.  No masses. SKIN:  No rashes, ulcers or lesions. EXTREMITIES: No edema, no skin discoloration or tenderness.  No palpable cords. LYMPH NODES: No palpable cervical, supraclavicular, axillary or inguinal adenopathy  NEUROLOGICAL: Unremarkable. PSYCH:  Appropriate.    Appointment on 03/13/2018  Component Date Value Ref Range Status  . Sodium 03/13/2018 135  135 - 145 mmol/L Final  . Potassium 03/13/2018 3.5  3.5 - 5.1 mmol/L Final  . Chloride 03/13/2018 103  101 - 111 mmol/L Final  . CO2 03/13/2018 24  22 - 32 mmol/L Final  . Glucose, Bld 03/13/2018 114* 65 - 99 mg/dL Final  . BUN 03/13/2018 13  6 - 20 mg/dL Final  . Creatinine, Ser 03/13/2018 0.68  0.44 - 1.00 mg/dL Final  . Calcium 03/13/2018 8.9  8.9 - 10.3 mg/dL Final  . Total Protein 03/13/2018 7.7  6.5 - 8.1 g/dL Final  . Albumin 03/13/2018 3.9  3.5 - 5.0 g/dL Final  . AST 03/13/2018 36  15 - 41 U/L Final  . ALT 03/13/2018 38  14 - 54 U/L Final  . Alkaline Phosphatase 03/13/2018 147* 38 - 126 U/L Final  . Total Bilirubin 03/13/2018 0.5  0.3 - 1.2 mg/dL Final  . GFR calc non Af Amer 03/13/2018 >60  >60 mL/min Final  . GFR calc Af Amer 03/13/2018 >60  >60 mL/min Final    Comment: (NOTE) The eGFR has been calculated using the CKD EPI equation. This calculation has not been validated in all clinical situations. eGFR's persistently <60 mL/min signify possible Chronic Kidney Disease.   . Anion gap 03/13/2018 8  5 -  15 Final   Performed at [facility], [address] [place], [place] [address]  . WBC 03/13/2018 3.7  3.6 - 11.0 K/uL Final  . RBC 03/13/2018 3.68* 3.80 - 5.20 MIL/uL Final  . Hemoglobin 03/13/2018 12.7  12.0 - 16.0 g/dL Final  . HCT 03/13/2018 36.2  35.0 - 47.0 % Final  . MCV 03/13/2018 98.3  80.0 - 100.0 fL Final  . MCH 03/13/2018 34.5* 26.0 - 34.0 pg Final  . MCHC 03/13/2018 35.1  32.0 - 36.0 g/dL Final  . RDW 03/13/2018 12.4  11.5 - 14.5 % Final  . Platelets 03/13/2018 193  150 - 440 K/uL Final  . Neutrophils Relative % 03/13/2018 68  % Final  . Neutro Abs 03/13/2018 2.5  1.4 - 6.5 K/uL Final  . Lymphocytes Relative 03/13/2018 24  % Final  . Lymphs Abs 03/13/2018 0.9* 1.0 - 3.6 K/uL Final  . Monocytes Relative 03/13/2018 6  % Final  . Monocytes Absolute 03/13/2018 0.2  0.2 - 0.9 K/uL Final  . Eosinophils Relative 03/13/2018 2  % Final  . Eosinophils Absolute 03/13/2018 0.1  0 - 0.7 K/uL Final  . Basophils Relative 03/13/2018 0  % Final  . Basophils Absolute 03/13/2018 0.0  0 - 0.1 K/uL Final   Performed at [facility], [address], [place], [place] [address]  . Intrinsic Factor 03/13/2018 1.0  0.0 - 1.1 AU/mL Final   Comment: (NOTE) Performed At: [facility] [place], [place] [id number] Rush Farmer MD NT:[id number] Performed at [facility], [address] [place], [place] [address]   . Parietal Cell Antibody-IgG 03/13/2018 1.7  0.0 - 20.0 Units Final   Comment: (NOTE)                                Negative    0.0 - 20.0                                Equivocal  20.1 - 24.9                                Positive         >24.9 Parietal Cell Antibodies are  found in 90% of patients with pernicious anemia and 30% of first degree relatives with pernicious anemia. Performed At: [facility] [place], [place] [id number] Rush Farmer MD JT:[id number] Performed at [facility], [address], [place], [place] [address]     Assessment:  Joanna Hall is a 52 y.o. female with a history of stage IB1 cervical cancer s/p concurrent cisplatin and radiation (external beam and brachytherapy) from 11/2016 - 02/2017.    She was treated by Dr Christene Slates at [facility] in [place], [place].  In 01/2017, she received cisplatin x 2 and carboplatin x 1 (01/29/2017) due to ARF and XRT. Radiation was followed by T & O on 02/01/2017 and T & N on 02/10/2017 and 02/20/2017.  Course was complicated by weight loss (80 pounds), nausea, vomiting, electrolyte wasting (potassium and magnesium), and multiple hospitalizations.  PET scan in 06/2017 revealed enlarging pulmonary nodules but no evidence of abdominal disease per patient report.  She is s/p right ileocolectomy on 08/31/2017 for a small bowel obstruction.  She presented with abdominal pain.  Abdomen and pelvic CT scan on 09/13/2017 revealed a small infarct in the inferior pole of the LEFT kidney and RIGHT uterine infarct.  PET scan on 11/08/2017 at the [place] [facility] revealed innumerable stable bilateral cavitary pulmonary nodules (up to 8 mm), some of which demonstrate low level metabolic activity.  There were post treatment changes in the pelvis without evidence of suspicious hypermetabolic activity.  Abdomen and pelvic CT on 12/03/2017 revealed continued multiple pulmonary nodules in the lung bases concerning for metastatic disease.  There was mild wall thickening of rectosigmoid colon is noted.  There was a stable 1.8 x 1.6 cm mixed fat and soft tissue density noted in right lower quadrant which may represent fat  necrosis.  There was a larger low density is noted involving uterine fundus.  Pelvic MRI on 12/03/2017 was unremarkable.    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  Testing was negative for prothrombin gene mutation, lupus anticoagulant panel, anticardiolipin antibodies, and beta-2 glycoprotein antibodies.  She was discharged on Xarelto.  She was admitted to [facility] from 12/03/2017 - 12/08/2017 with abdominal pain. She was not felt to have a uterine infarct.  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  The etiology was unclear.  She is hepatitis C positive.   Hepatitis C genotype is 2a/2c.  She has B12 deficiency.  B12 was 165 on 11/27/2017.  She began B12 injections.  Symptomatically, she feels better.  LLQ pain has dramatically improved.  Exam reveals minimal LLQ discomfort on palpation.    Plan:   1.  Labs today:  CBC with diff, CMP, anti-parietal antibodies, intrinsic factor antibodies. 2.  Discuss interval EGD and colonoscopy. 3.  Discuss interval surgery for perianal condylomata. 4.  Discuss plan for knee surgery.  She will hold Xarelto at least 2 days prior to surgery and restart per orthopedics. 5.  CT scans were denied for restaging cervical cancer.  Discuss rescheduling. 6.  Discuss patient's plans for treatment of hepatitis C with Mayret. 7.  Refill Xarelto 20 mg a day. 8.  Continue B12 supplimentation. 9.  Reschedule chest, abdomen, pelvic CT for 04/15/2018 10.  RTC on 07/03 for MD assessment, labs (CBC with diff, CMP), and review of imaging.   Lequita Asal, MD  03/13/2018, 4:35 PM

## 2018-03-14 ENCOUNTER — Encounter: Payer: Medicaid Other | Admitting: Physical Therapy

## 2018-03-14 LAB — INTRINSIC FACTOR ANTIBODIES: Intrinsic Factor: 1 AU/mL (ref 0.0–1.1)

## 2018-03-15 LAB — ANTI-PARIETAL ANTIBODY: Parietal Cell Antibody-IgG: 1.7 Units (ref 0.0–20.0)

## 2018-03-18 ENCOUNTER — Encounter: Payer: Medicaid Other | Admitting: Physical Therapy

## 2018-03-20 ENCOUNTER — Ambulatory Visit: Payer: Medicaid Other | Admitting: Physical Therapy

## 2018-03-21 ENCOUNTER — Encounter: Payer: Self-pay | Admitting: Hematology and Oncology

## 2018-03-21 ENCOUNTER — Encounter: Payer: Medicaid Other | Admitting: Physical Therapy

## 2018-03-21 ENCOUNTER — Ambulatory Visit: Payer: Medicaid Other | Admitting: Physical Therapy

## 2018-03-24 ENCOUNTER — Other Ambulatory Visit: Payer: Self-pay

## 2018-03-24 ENCOUNTER — Emergency Department: Payer: Medicaid Other

## 2018-03-24 ENCOUNTER — Emergency Department
Admission: EM | Admit: 2018-03-24 | Discharge: 2018-03-24 | Disposition: A | Discharge status: Home / Self Care | Payer: Medicaid Other | Attending: Emergency Medicine | Admitting: Emergency Medicine

## 2018-03-24 DIAGNOSIS — S86812A Strain of other muscle(s) and tendon(s) at lower leg level, left leg, initial encounter: Secondary | ICD-10-CM | POA: Diagnosis not present

## 2018-03-24 DIAGNOSIS — S90121A Contusion of right lesser toe(s) without damage to nail, initial encounter: Secondary | ICD-10-CM

## 2018-03-24 DIAGNOSIS — Y999 Unspecified external cause status: Secondary | ICD-10-CM | POA: Diagnosis not present

## 2018-03-24 DIAGNOSIS — S93402A Sprain of unspecified ligament of left ankle, initial encounter: Secondary | ICD-10-CM | POA: Insufficient documentation

## 2018-03-24 DIAGNOSIS — Y939 Activity, unspecified: Secondary | ICD-10-CM | POA: Diagnosis not present

## 2018-03-24 DIAGNOSIS — Z7901 Long term (current) use of anticoagulants: Secondary | ICD-10-CM | POA: Diagnosis not present

## 2018-03-24 DIAGNOSIS — Y929 Unspecified place or not applicable: Secondary | ICD-10-CM | POA: Insufficient documentation

## 2018-03-24 DIAGNOSIS — Z87891 Personal history of nicotine dependence: Secondary | ICD-10-CM | POA: Diagnosis not present

## 2018-03-24 DIAGNOSIS — Z79899 Other long term (current) drug therapy: Secondary | ICD-10-CM | POA: Insufficient documentation

## 2018-03-24 DIAGNOSIS — S99912A Unspecified injury of left ankle, initial encounter: Secondary | ICD-10-CM | POA: Diagnosis present

## 2018-03-24 DIAGNOSIS — I10 Essential (primary) hypertension: Secondary | ICD-10-CM | POA: Insufficient documentation

## 2018-03-24 DIAGNOSIS — W19XXXA Unspecified fall, initial encounter: Secondary | ICD-10-CM | POA: Diagnosis not present

## 2018-03-24 MED ORDER — OXYCODONE-ACETAMINOPHEN 5-325 MG PO TABS: 1.0000 | ORAL_TABLET | Freq: Four times a day (QID) | ORAL | 0 refills | Status: DC | PRN

## 2018-03-24 MED ORDER — HYDROMORPHONE HCL 1 MG/ML IJ SOLN
1.0000 mg | Freq: Once | INTRAMUSCULAR | Status: AC
  Administered 2018-03-24: 1 mg via INTRAMUSCULAR
  Filled 2018-03-24: qty 1

## 2018-03-24 MED ORDER — CYCLOBENZAPRINE HCL 10 MG PO TABS: 10.0000 mg | ORAL_TABLET | Freq: Three times a day (TID) | ORAL | 0 refills | Status: DC | PRN

## 2018-03-24 MED ORDER — KETOROLAC TROMETHAMINE 60 MG/2ML IM SOLN
60.0000 mg | Freq: Once | INTRAMUSCULAR | Status: AC
  Administered 2018-03-24: 60 mg via INTRAMUSCULAR
  Filled 2018-03-24: qty 2

## 2018-03-24 MED ORDER — ONDANSETRON 4 MG PO TBDP
ORAL_TABLET | ORAL | Status: AC
  Administered 2018-03-24: 19:00:00
  Filled 2018-03-24: qty 1

## 2018-03-24 NOTE — ED Provider Notes (Signed)
[facility] Emergency Department Provider Note   ____________________________________________   First MD Initiated Contact with Patient 03/24/18 1635     (approximate)  I have reviewed the triage vital signs and the nursing notes.   HISTORY  Chief Complaint Fall and Ankle Pain    HPI Joanna Hall is a 52 y.o. female patient complain of left ankle and right foot pain secondary to fall last night.  Patient able to bear weight right foot without difficulty.  Patient state pain radiates to left calf.  Patient describes calf pain as "crampy".  It is noted patient has a bruise and swollen fourth digit of the left foot.  Patient rates the pain as a 10/10.  Patient described the pain is "aching".  No palliative measures prior to arrival.   Past Medical History:  Diagnosis Date  . Anxiety   . Aortic atherosclerosis ([place])   . Arthritis    neck and knees  . 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 Blood transfusion without reported diagnosis   . Cervical cancer ([place])   . Chronic anal fissure   . Chronic diarrhea   . Dyspnea   . Factor V Leiden mutation ([place])   . Fecal incontinence   . Genital warts   . GERD (gastroesophageal reflux disease)   . Heart murmur   . Hemorrhoids   . 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 History of cancer chemotherapy    completed 06/2017  . History of Clostridium difficile infection    while undergoing chemo.  Negative test 10/2017  . Hypertension    during chemo treatments  . 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 Macrocytic anemia with vitamin B12 deficiency   . Perianal condylomata   . Pneumonia    History of  . Pulmonary nodules   . Small bowel obstruction ([place]) 08/2017  . Stiff neck    limited right turn  . Vitamin D deficiency     Patient Active Problem List   Diagnosis Date Noted  . Osteoarthritis of left knee 02/28/2018  . Condyloma  acuminatum of anus s/p ablation 02/22/2018 02/22/2018  . Condyloma acuminatum of vagina s/p ablation 02/22/2018 02/22/2018  . Positive ANA (antinuclear antibody) 02/04/2018  . Aortic atherosclerosis ([place]) 01/15/2018  . Elevated MCV 01/15/2018  . Anemia 01/15/2018  . Genital warts 01/15/2018  . Vitamin D deficiency 01/15/2018  . Pernicious anemia   . Rectal bleeding   . Impingement syndrome of shoulder region 12/19/2017  . Multiple joint pain 12/19/2017  . Osteoarthritis of right knee 12/19/2017  . B12 deficiency 12/11/2017  . Lung nodules 12/11/2017  . Malnutrition of moderate degree 12/04/2017  . Abdominal pain 12/03/2017  . History of Clostridium difficile infection 10/23/2017  . Factor V Leiden ([place]) 10/19/2017  . Goals of care, counseling/discussion 10/19/2017  . Abnormal cervical Papanicolaou smear 09/18/2017  . Cervical cancer, FIGO stage IB1 ([place]) 09/18/2017  . Chronic hepatitis C without hepatic coma ([place]) 09/18/2017  . Cytopenia 09/18/2017  . Diarrhea 09/18/2017  . Elevated LFTs 09/18/2017  . Erosive gastropathy 09/18/2017  . Hyponatremia 09/18/2017  . Intestinal infection due to Clostridium difficile 09/18/2017  . Lower abdominal pain 09/18/2017  . 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 Malignant neoplasm of overlapping sites of cervix ([place]) 09/18/2017  . Pelvic inflammatory disease (PID) 09/18/2017  . Renal insufficiency 09/18/2017  . Wound infection  after surgery 09/12/2017  . Hypokalemia   . Hypomagnesemia   . Ileocolic anastomotic leak   . Cervical arthritis 07/18/2017  . Dysuria 06/20/2017  . Metastatic cancer ([place]) 05/12/2017  . Hepatitis, chronic ([place]) 05/05/2017  . Essential hypertension 03/15/2017  . Anemia in other chronic diseases classified elsewhere 03/01/2017  . Chemotherapy-induced neutropenia ([place]) 01/29/2017  . 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 CHOLECYSTECTOMY    . COLON  SURGERY  08/2017   resection  . COLONOSCOPY WITH PROPOFOL N/A 12/20/2017   Procedure: COLONOSCOPY WITH PROPOFOL;  Surgeon: [name], MD;  Location: [facility] ENDOSCOPY;  Service: Gastroenterology;  Laterality: N/A;  . DIAGNOSTIC LAPAROSCOPY    . ESOPHAGOGASTRODUODENOSCOPY (EGD) WITH PROPOFOL N/A 12/20/2017   Procedure: ESOPHAGOGASTRODUODENOSCOPY (EGD) WITH PROPOFOL;  Surgeon: [name], MD;  Location: [place];  Service: Gastroenterology;  Laterality: N/A;  . LAPAROTOMY N/A 08/31/2017   Procedure: EXPLORATORY LAPAROTOMY for SBO, ileocolectomy, removal of piece of uterine wall;  Surgeon: [name], MD;  Location: [facility] ORS;  Service: General;  Laterality: N/A;  . LASER ABLATION CONDOLAMATA N/A 02/22/2018   Procedure: LASER ABLATION/REMOVAL OF ENIDPOEUMPN AROUND ANUS AND VAGINA;  Surgeon: Michael Boston, MD;  Location: [place];  Service: General;  Laterality: N/A;  . OOPHORECTOMY    . SMALL INTESTINE SURGERY    . TANDEM RING INSERTION     x3  . THORACOTOMY      Prior to Admission medications   Medication Sig Start Date End Date Taking? Authorizing Provider  ALOE VERA CONCENTRATE PO Take by mouth.    [provider]  cholestyramine (QUESTRAN) 4 g packet Take 1 packet (4 g total) by mouth 3 (three) times daily. Patient taking differently: Take 4 g by mouth 2 (two) times daily.  02/11/18 05/12/18  [name], MD  cyanocobalamin (,VITAMIN B-12,) 1000 MCG/ML injection Inject 1ML every week x 4 weeks, then 1ML every other week x 2 months, then monthly x 3 months 11/29/17   [name], MD  cyclobenzaprine (FLEXERIL) 10 MG tablet Take 1 tablet (10 mg total) by mouth 3 (three) times daily as needed. Patient not taking: Reported on 03/13/2018 02/21/18   Versie Starks, PA-C  cyclobenzaprine (FLEXERIL) 10 MG tablet Take 1 tablet (10 mg total) by mouth 3 (three) times daily as needed. 03/24/18   Sable Feil, PA-C  dicyclomine (BENTYL) 10 MG  capsule Take 1 capsule (10 mg total) by mouth 4 (four) times daily -  before meals and at bedtime. 02/11/18 08/10/18  [name], MD  Digestive Enzymes (ENZYME DIGEST PO) Take by mouth.    [provider]  fluticasone (FLONASE) 50 MCG/ACT nasal spray Place 2 sprays into both nostrils daily. 02/11/18   Steele Sizer, MD  hydrocortisone (ANUSOL-HC) 2.5 % rectal cream Place 1 application rectally 2 (two) times daily. 11/15/17   [name], MD  loperamide (IMODIUM A-D) 2 MG tablet Take 2 mg by mouth 4 (four) times daily as needed for diarrhea or loose stools.    [provider]  loratadine (CLARITIN) 10 MG tablet Take 1 tablet (10 mg total) by mouth daily. Patient not taking: Reported on 03/13/2018 02/11/18   Steele Sizer, MD  Multiple Vitamin (MULTIVITAMIN WITH MINERALS) TABS tablet Take 1 tablet daily by mouth.    [provider]  oxyCODONE (OXY IR/ROXICODONE) 5 MG immediate release tablet Take 1-2 tablets (5-10 mg total) by mouth  every 6 (six) hours as needed for moderate pain, severe pain or breakthrough pain. Patient not taking: Reported on 03/13/2018 02/22/18   Michael Boston, MD  oxyCODONE-acetaminophen (PERCOCET) 5-325 MG tablet Take 1 tablet by mouth every 6 (six) hours as needed for severe pain. 03/24/18 03/24/19  Sable Feil, PA-C  Probiotic Product (PROBIOMAX DAILY DF PO) Take by mouth.    [provider]  rivaroxaban (XARELTO) 20 MG TABS tablet Take 1 tablet (20 mg total) by mouth daily with supper. 03/13/18   Lequita Asal, MD  Syringe/Needle, Disp, (SYRINGE 3CC/22GX1") 22G X 1" 3 ML MISC For use with Vitamin B12 injections 11/29/17   Vanga, Tally Due, MD  VENTOLIN HFA 108 (90 Base) MCG/ACT inhaler INHALE 1-2 PUFFS BY MOUTH EVERY 4 HOURS AS NEEDED FOR WHEEZING / SHORTNESS OF BREATH 08/02/17   [provider]    Allergies Ketamine  Family History  Problem Relation Age of Onset  . Hypertension Father   . Diabetes Father   .  Alcohol abuse Daughter   . Hypertension Maternal Grandmother   . Diabetes Maternal Grandmother   . Diabetes Paternal Grandmother   . Hypertension Paternal Grandmother     Social History Social History   Tobacco Use  . Smoking status: Former Smoker    Last attempt to quit: 10/16/2006    Years since quitting: 11.4  . Smokeless tobacco: Never Used  Substance Use Topics  . Alcohol use: Yes    Frequency: Never    Comment: seldom  . Drug use: No    Review of Systems  Constitutional: No fever/chills Eyes: No visual changes. ENT: No sore throat. Cardiovascular: Denies chest pain. Respiratory: Denies shortness of breath. Gastrointestinal: No abdominal pain.  No nausea, no vomiting.  No diarrhea.  No constipation. Genitourinary: Negative for dysuria. Musculoskeletal: Negative for back pain. Skin: Negative for rash. Neurological: Negative for headaches, focal weakness or numbness. Psychiatric:Anxiety Endocrine:Hypertension hepatitis C. Hematological/Lymphatic: Allergic/Immunilogical:Ketamine____________________________________________   PHYSICAL EXAM:  VITAL SIGNS: ED Triage Vitals [03/24/18 1611]  Enc Vitals Group     BP (!) 129/92     Pulse Rate 99     Resp 18     Temp 98.8 F (37.1 C)     Temp Source Oral     SpO2 100 %     Weight 169 lb (76.7 kg)     Height 5' 8"  (1.727 m)     Head Circumference      Peak Flow      Pain Score 10     Pain Loc      Pain Edu?      Excl. in [place]?    Constitutional: Alert and oriented. Well appearing and in no acute distress. Cardiovascular: Normal rate, regular rhythm. Grossly normal heart sounds.  Good peripheral circulation. Respiratory: Normal respiratory effort.  No retractions. Lungs CTAB. Gastrointestinal: Soft and nontender. No distention. No abdominal bruits. No CVA tenderness. Musculoskeletal: No obvious deformity to the left ankle.  Patient has some moderate edema to the lateral malleolus and at the dorsal aspect of her  foot.  There is no obvious deformity to the right foot.  Patient has ecchymosis and edema to the fourth digit of the right foot.. Neurologic:  Normal speech and language. No gross focal neurologic deficits are appreciated. No gait instability. Skin:  Skin is warm, dry and intact. No rash noted. Psychiatric: Mood and affect are normal. Speech and behavior are normal.  ____________________________________________   LABS (all labs ordered are listed,  but only abnormal results are displayed)  Labs Reviewed - No data to display ____________________________________________  EKG   ____________________________________________  RADIOLOGY  ED MD interpretation:    Official radiology report(s): Dg Ankle Complete Left  Result Date: 03/24/2018 CLINICAL DATA:  Fall last night. Left ankle pain and swelling. Initial encounter. EXAM: LEFT ANKLE COMPLETE - 3+ VIEW COMPARISON:  None. FINDINGS: There is no evidence of fracture, dislocation, or joint effusion. There is no evidence of arthropathy or other focal bone abnormality. Several soft tissue phleboliths are noted in the lower leg. IMPRESSION: No acute findings. Electronically Signed   By: Earle Gell M.D.   On: 03/24/2018 16:53   Dg Foot Complete Left  Result Date: 03/24/2018 CLINICAL DATA:  Fall last night. Foot pain and swelling. Initial encounter. EXAM: LEFT FOOT - COMPLETE 3+ VIEW COMPARISON:  None. FINDINGS: There is no evidence of fracture or dislocation. Degenerative spurring is seen in the midfoot involving the tarsal-metatarsal joints. Mild degenerative spurring is seen involving the 1st metatarsophalangeal joint. Small plantar calcaneal bone spur also noted. IMPRESSION: No acute findings. Electronically Signed   By: Earle Gell M.D.   On: 03/24/2018 16:55   Dg Foot Complete Right  Result Date: 03/24/2018 CLINICAL DATA:  [place] last night.  Bruising of the fourth digit. EXAM: RIGHT FOOT COMPLETE - 3+ VIEW COMPARISON:  None. FINDINGS: There is  no evidence of fracture or dislocation. A remote fifth metatarsal fracture is present. Mild degenerative changes are noted in the midfoot. Calcaneal spurs are present. There is no evidence of arthropathy or other focal bone abnormality. Soft tissues are unremarkable. IMPRESSION: 1. No acute abnormality. 2. Remote fracture involving the fifth metatarsal. Electronically Signed   By: San Morelle M.D.   On: 03/24/2018 18:21    ____________________________________________   PROCEDURES  Procedure(s) performed: None  Procedures  Critical Care performed: No  ____________________________________________   INITIAL IMPRESSION / ASSESSMENT AND PLAN / ED COURSE  As part of my medical decision making, I reviewed the following data within the [place]    Patient presents for left lower extremity and right foot pain secondary to fall last night.  Discussed x-ray findings with patient with no acute findings.  Patient given discharge care instruction.  Patient placed in a ankle splint.  Patient third and fourth digit right foot were buddy tape.  Patient Was Ace wrap.  Patient given crutches for ambulation.  Patient advised to follow-up PCP in 3 to 5 days.      ____________________________________________   FINAL CLINICAL IMPRESSION(S) / ED DIAGNOSES  Final diagnoses:  Sprain of left ankle, unspecified ligament, initial encounter  Contusion of fourth toe of right foot, initial encounter  Strain of calf muscle, left, initial encounter     ED Discharge Orders        Ordered    oxyCODONE-acetaminophen (PERCOCET) 5-325 MG tablet  Every 6 hours PRN     03/24/18 1831    cyclobenzaprine (FLEXERIL) 10 MG tablet  3 times daily PRN     03/24/18 1831       Note:  This document was prepared using Dragon voice recognition software and may include unintentional dictation errors.    Sable Feil, PA-C 03/24/18 1901    Nena Polio, MD 03/24/18 2017

## 2018-03-24 NOTE — ED Notes (Signed)
Pt requesting to leave. Pt given paper scrubs to wear home. Pt denies dizziness at this time. Pt wheeled out to [place] by her friend.

## 2018-03-24 NOTE — ED Notes (Signed)
Pt was getting up to wheelchair and reports nausea. Upon standing pt began to vomit and have diarrhea. Pt reports this is "normal" for her due to hx of bowel surgeries in the past. Pt states "just give me nausea meds and I'll be fine to go home."

## 2018-03-24 NOTE — Discharge Instructions (Signed)
Wear splint and ambulate with crutches for 3 to 5 days as needed.

## 2018-03-24 NOTE — ED Notes (Signed)
Post Op shoe applied to RIGHT foot, 3rd and 4th toes on RIGHT foot are buddytaped.   Applied an ace wrap on LEFT calf, a velcro ankle splint to LEFT ankle.  Pt was given crutches and instructed on how to use them.

## 2018-03-24 NOTE — ED Triage Notes (Signed)
Pt arrives to ED after a fall last night. C/o L foot and ankle pain (has a cyst on top of L foot that is unrelated). States when she stands and bears weight on L foot her L calf feels like it's cramping. R 4th toe is bruised and swollen as well. Denies LOC. Denies hitting head. Alert and oriented.

## 2018-03-25 ENCOUNTER — Encounter: Payer: Medicaid Other | Admitting: Physical Therapy

## 2018-03-26 ENCOUNTER — Ambulatory Visit: Payer: Medicaid Other | Admitting: Physical Therapy

## 2018-03-28 ENCOUNTER — Encounter: Payer: Medicaid Other | Admitting: Physical Therapy

## 2018-03-28 ENCOUNTER — Ambulatory Visit: Payer: Medicaid Other | Admitting: Family Medicine

## 2018-04-02 ENCOUNTER — Encounter: Payer: Self-pay | Admitting: Family Medicine

## 2018-04-02 ENCOUNTER — Ambulatory Visit: Payer: Medicaid Other | Admitting: Family Medicine

## 2018-04-02 VITALS — BP 108/64 | HR 98 | Temp 97.7°F | Resp 14 | Ht 67.0 in | Wt 169.9 lb

## 2018-04-02 DIAGNOSIS — Z01818 Encounter for other preprocedural examination: Secondary | ICD-10-CM

## 2018-04-02 DIAGNOSIS — D6851 Activated protein C resistance: Secondary | ICD-10-CM

## 2018-04-02 DIAGNOSIS — I4510 Unspecified right bundle-branch block: Secondary | ICD-10-CM | POA: Diagnosis not present

## 2018-04-02 DIAGNOSIS — S99912A Unspecified injury of left ankle, initial encounter: Secondary | ICD-10-CM

## 2018-04-02 DIAGNOSIS — B182 Chronic viral hepatitis C: Secondary | ICD-10-CM | POA: Diagnosis not present

## 2018-04-02 MED ORDER — SERTRALINE HCL 50 MG PO TABS: 50.0000 mg | ORAL_TABLET | Freq: Every day | ORAL | 3 refills | Status: DC

## 2018-04-02 NOTE — Progress Notes (Signed)
BP 108/64   Pulse 98   Temp 97.7 F (36.5 C) (Oral)   Resp 14   Ht 5' 7"  (1.702 m)   Wt 169 lb 14.4 oz (77.1 kg)   SpO2 94%   BMI 26.61 kg/m    Subjective:    Patient ID: Joanna Hall, female    DOB: 06-Sep-1966, 52 y.o.   MRN: [id number]  HPI: Joanna Hall is a 52 y.o. female  Chief Complaint  Patient presents with  . surgical clearance  . Fall  . Depression    HPI Patient is here for surgical clearance Dr. [name] is planning to do a left knee replacement Surgery scheduled for July 10th She fell on Saturday before last; broke 4th toe RIGHT foot and sprained her left ankle; wearing boot; icing; pulled a muscle in the left leg below the knee; using ben gay and ace wrap Big knot on the lateral right knee; going to see Dr. [name] about all this at 2:50 pm  She is in remission for cervical cancer; sees Dr. Mike Gip (oncologist) and Dr. Fleet Contras (GYN) and Dr. Fransisca Connors (OB-GYN) She sees Dr. Marius Ditch (GI) for scar tissue wrapped about intestines; had partial colectomy and some small intestines removed and is on cholestyramine for that; down to one pack a day; two makes her constipated She has a known cardiac murmur; last echo reviewed; LVEF 08-14%, grade 1 diastolic dysfxn; Dr. Fletcher Anon Last labs reviewed; RBC low at 3.68, with normal H/H (12.7/36.2)  Previous surgery: many; no problems with anesthesia Easy bleeding: on Xarelto, so will have to get clearance from Dr. Mike Gip (maybe two days prior) Hx of MRSA: NO Hx of MI: NO Hx of stroke: NO  No current dysuria  Hepatitis C: will be starting treatment soon after knee surgery  Depression: was on zoloft last year and wonders if she could go back on that; dealing with daughter's death; was on 50 mg then; did well on it; she will start counseling  Depression screen [facility] 2/9 04/02/2018 02/28/2018 01/15/2018  Decreased Interest 1 0 0  Down, Depressed, Hopeless 2 0 1  PHQ - 2 Score 3 0 1  Altered sleeping 3 - -  Tired,  decreased energy 3 - -  Change in appetite 0 - -  Feeling bad or failure about yourself  0 - -  Trouble concentrating 0 - -  Moving slowly or fidgety/restless 0 - -  Suicidal thoughts 0 - -  PHQ-9 Score 9 - -  Difficult doing work/chores Somewhat difficult - -    Relevant past medical, surgical, family and social history reviewed Past Medical History:  Diagnosis Date  . Anxiety   . Aortic atherosclerosis ([place])   . Arthritis    neck and knees  . 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 Blood transfusion without reported diagnosis   . Cervical cancer ([place])   . Chronic anal fissure   . Chronic diarrhea   . Dyspnea   . Factor V Leiden mutation ([place])   . Fecal incontinence   . Genital warts   . GERD (gastroesophageal reflux disease)   . Heart murmur   . Hemorrhoids   . 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 History of cancer chemotherapy    completed 06/2017  . History of Clostridium difficile infection    while undergoing chemo.  Negative test 10/2017  . Hypertension  during chemo treatments  . Infarction of kidney ([place]) left kidney   and uterus  . Macrocytic anemia with vitamin B12 deficiency   . Perianal condylomata   . Pneumonia    History of  . Pulmonary nodules   . Small bowel obstruction ([place]) 08/2017  . Stiff neck    limited right turn  . Vitamin D deficiency    Past Surgical History:  Procedure Laterality Date  . CHOLECYSTECTOMY    . COLON SURGERY  08/2017   resection  . COLONOSCOPY WITH PROPOFOL N/A 12/20/2017   Procedure: COLONOSCOPY WITH PROPOFOL;  Surgeon: [name], MD;  Location: [facility];  Service: Gastroenterology;  Laterality: N/A;  . DIAGNOSTIC LAPAROSCOPY    . ESOPHAGOGASTRODUODENOSCOPY (EGD) WITH PROPOFOL N/A 12/20/2017   Procedure: ESOPHAGOGASTRODUODENOSCOPY (EGD) WITH PROPOFOL;  Surgeon: [name], MD;  Location: [place] [facility];  Service: Gastroenterology;  Laterality: N/A;  .  LAPAROTOMY N/A 08/31/2017   Procedure: EXPLORATORY LAPAROTOMY for SBO, ileocolectomy, removal of piece of uterine wall;  Surgeon: [name], MD;  Location: [facility] ORS;  Service: General;  Laterality: N/A;  . LASER ABLATION CONDOLAMATA N/A 02/22/2018   Procedure: LASER ABLATION/REMOVAL OF SWHQPRFFMBW AROUND ANUS AND VAGINA;  Surgeon: Michael Boston, MD;  Location: [place];  Service: General;  Laterality: N/A;  . OOPHORECTOMY    . SMALL INTESTINE SURGERY    . TANDEM RING INSERTION     x3  . THORACOTOMY     Family History  Problem Relation Age of Onset  . Hypertension Father   . Diabetes Father   . Alcohol abuse Daughter   . Hypertension Maternal Grandmother   . Diabetes Maternal Grandmother   . Diabetes Paternal Grandmother   . Hypertension Paternal Grandmother    Social History   Tobacco Use  . Smoking status: Former Smoker    Last attempt to quit: 10/16/2006    Years since quitting: 11.4  . Smokeless tobacco: Never Used  Substance Use Topics  . Alcohol use: Yes    Frequency: Never    Comment: seldom  . Drug use: No    Interim medical history since last visit reviewed. Allergies and medications reviewed  Review of Systems  Constitutional: Negative for unexpected weight change.  HENT: Negative for sore throat.   Eyes: Negative for discharge.  Respiratory: Negative for shortness of breath.   Cardiovascular: Negative for chest pain.  Gastrointestinal: Negative for blood in stool.  Genitourinary: Negative for dysuria and hematuria.  Musculoskeletal:       Recent fall (mechanical, heavy rain), hurt left ankle, left leg, right toe  Allergic/Immunologic: Negative for food allergies.  Neurological: Negative for syncope.   Per HPI unless specifically indicated above     Objective:    BP 108/64   Pulse 98   Temp 97.7 F (36.5 C) (Oral)   Resp 14   Ht 5' 7"  (1.702 m)   Wt 169 lb 14.4 oz (77.1 kg)   SpO2 94%   BMI 26.61 kg/m   Wt Readings from Last  3 Encounters:  04/02/18 169 lb 14.4 oz (77.1 kg)  03/24/18 169 lb (76.7 kg)  03/13/18 166 lb 0.1 oz (75.3 kg)    Physical Exam  Constitutional: She appears well-developed and well-nourished. No distress.  HENT:  Head: Normocephalic and atraumatic.  Eyes: EOM are normal. No scleral icterus.  Neck: No thyromegaly present.  Cardiovascular: Normal rate and regular rhythm.  Murmur heard.  Systolic murmur is present with a grade of 2/6.  Pulmonary/Chest: Effort normal and breath sounds normal. No respiratory distress. She has no wheezes.  Abdominal: Soft. Bowel sounds are normal. She exhibits no distension.  Musculoskeletal: She exhibits no edema.       Left knee: She exhibits swelling (ace wrap).       Left ankle: She exhibits decreased range of motion and swelling.       Legs: Wearing brace on LEFT ankle; ace wrap on left leg  Neurological: She is alert. She exhibits normal muscle tone.  Skin: Skin is warm and dry. She is not diaphoretic. No pallor.  Psychiatric: She has a normal mood and affect. Her mood appears not anxious. She does not exhibit a depressed mood.   Results for orders placed or performed in visit on 03/13/18  Comprehensive metabolic panel  Result Value Ref Range   Sodium 135 135 - 145 mmol/L   Potassium 3.5 3.5 - 5.1 mmol/L   Chloride 103 101 - 111 mmol/L   CO2 24 22 - 32 mmol/L   Glucose, Bld 114 (H) 65 - 99 mg/dL   BUN 13 6 - 20 mg/dL   Creatinine, Ser 0.68 0.44 - 1.00 mg/dL   Calcium 8.9 8.9 - 10.3 mg/dL   Total Protein 7.7 6.5 - 8.1 g/dL   Albumin 3.9 3.5 - 5.0 g/dL   AST 36 15 - 41 U/L   ALT 38 14 - 54 U/L   Alkaline Phosphatase 147 (H) 38 - 126 U/L   Total Bilirubin 0.5 0.3 - 1.2 mg/dL   GFR calc non Af Amer >60 >60 mL/min   GFR calc Af Amer >60 >60 mL/min   Anion gap 8 5 - 15  CBC with Differential  Result Value Ref Range   WBC 3.7 3.6 - 11.0 K/uL   RBC 3.68 (L) 3.80 - 5.20 MIL/uL   Hemoglobin 12.7 12.0 - 16.0 g/dL   HCT 36.2 35.0 - 47.0 %   MCV  98.3 80.0 - 100.0 fL   MCH 34.5 (H) 26.0 - 34.0 pg   MCHC 35.1 32.0 - 36.0 g/dL   RDW 12.4 11.5 - 14.5 %   Platelets 193 150 - 440 K/uL   Neutrophils Relative % 68 %   Neutro Abs 2.5 1.4 - 6.5 K/uL   Lymphocytes Relative 24 %   Lymphs Abs 0.9 (L) 1.0 - 3.6 K/uL   Monocytes Relative 6 %   Monocytes Absolute 0.2 0.2 - 0.9 K/uL   Eosinophils Relative 2 %   Eosinophils Absolute 0.1 0 - 0.7 K/uL   Basophils Relative 0 %   Basophils Absolute 0.0 0 - 0.1 K/uL  Intrinsic Factor Antibodies  Result Value Ref Range   Intrinsic Factor 1.0 0.0 - 1.1 AU/mL  Anti-parietal antibody  Result Value Ref Range   Parietal Cell Antibody-IgG 1.7 0.0 - 20.0 Units      Assessment & Plan:   Problem List Items Addressed This Visit      Digestive   Chronic hepatitis C without hepatic coma (HCC) (Chronic)    Patient plans to undergo treatment after her knee replacement        Hematopoietic and Hemostatic   Factor V Leiden ([place])    Directions for Xarelto peri-operatively will be determined by Dr. Mike Gip (heme-onc)       Other Visit Diagnoses    Pre-op evaluation    -  Primary   low risk for total knee replacement; Xarelto instructions to come from heme-onc; pre-op labs already ordered by another provider;  will give medical clearance   Relevant Orders   EKG 12-Lead   Incomplete right bundle branch block (RBBB)       Ankle injury, left, initial encounter       patient going to see ortho today, right after this appointment       Follow up plan: No follow-ups on file.  An after-visit summary was printed and given to the patient at [facility].  Please see the patient instructions which may contain other information and recommendations beyond what is mentioned above in the assessment and plan.  Meds ordered this encounter  Medications  . sertraline (ZOLOFT) 50 MG tablet    Sig: Take 1 tablet (50 mg total) by mouth daily.    Dispense:  30 tablet    Refill:  3    Orders Placed This Encounter   Procedures  . EKG 12-Lead

## 2018-04-02 NOTE — Assessment & Plan Note (Signed)
Patient plans to undergo treatment after her knee replacement

## 2018-04-02 NOTE — Assessment & Plan Note (Signed)
Directions for Xarelto peri-operatively will be determined by Dr. Mike Gip (heme-onc)

## 2018-04-10 ENCOUNTER — Encounter
Admission: RE | Admit: 2018-04-10 | Discharge: 2018-04-10 | Disposition: A | Discharge status: Home / Self Care | Payer: Medicaid Other | Source: Ambulatory Visit | Attending: Orthopedic Surgery | Admitting: Orthopedic Surgery

## 2018-04-10 ENCOUNTER — Ambulatory Visit: Payer: Medicaid Other | Admitting: Physical Therapy

## 2018-04-10 ENCOUNTER — Other Ambulatory Visit: Payer: Self-pay

## 2018-04-10 DIAGNOSIS — Z01812 Encounter for preprocedural laboratory examination: Secondary | ICD-10-CM | POA: Insufficient documentation

## 2018-04-10 DIAGNOSIS — I10 Essential (primary) hypertension: Secondary | ICD-10-CM | POA: Diagnosis not present

## 2018-04-10 DIAGNOSIS — S99912A Unspecified injury of left ankle, initial encounter: Secondary | ICD-10-CM | POA: Diagnosis not present

## 2018-04-10 DIAGNOSIS — K219 Gastro-esophageal reflux disease without esophagitis: Secondary | ICD-10-CM | POA: Diagnosis not present

## 2018-04-10 DIAGNOSIS — B182 Chronic viral hepatitis C: Secondary | ICD-10-CM | POA: Insufficient documentation

## 2018-04-10 DIAGNOSIS — F419 Anxiety disorder, unspecified: Secondary | ICD-10-CM | POA: Insufficient documentation

## 2018-04-10 DIAGNOSIS — I4510 Unspecified right bundle-branch block: Secondary | ICD-10-CM | POA: Insufficient documentation

## 2018-04-10 DIAGNOSIS — Z87891 Personal history of nicotine dependence: Secondary | ICD-10-CM | POA: Insufficient documentation

## 2018-04-10 DIAGNOSIS — X58XXXA Exposure to other specified factors, initial encounter: Secondary | ICD-10-CM | POA: Insufficient documentation

## 2018-04-10 LAB — TYPE AND SCREEN
ABO/RH(D): O POS
ANTIBODY SCREEN: NEGATIVE

## 2018-04-10 LAB — BASIC METABOLIC PANEL
Anion gap: 7 (ref 5–15)
BUN: 17 mg/dL (ref 6–20)
CHLORIDE: 106 mmol/L (ref 98–111)
CO2: 24 mmol/L (ref 22–32)
CREATININE: 0.68 mg/dL (ref 0.44–1.00)
Calcium: 8.8 mg/dL — ABNORMAL LOW (ref 8.9–10.3)
GFR calc non Af Amer: >60 mL/min (ref >60)
Glucose, Bld: 79 mg/dL (ref 70–99)
Potassium: 4.2 mmol/L (ref 3.5–5.1)
Sodium: 137 mmol/L (ref 135–145)

## 2018-04-10 LAB — URINALYSIS, ROUTINE W REFLEX MICROSCOPIC
Bilirubin Urine: NEGATIVE
GLUCOSE, UA: NEGATIVE mg/dL
Hgb urine dipstick: NEGATIVE
Ketones, ur: NEGATIVE mg/dL
Leukocytes, UA: NEGATIVE
Nitrite: NEGATIVE
PH: 5 (ref 5.0–8.0)
Protein, ur: NEGATIVE mg/dL
SPECIFIC GRAVITY, URINE: 1.024 (ref 1.005–1.030)

## 2018-04-10 LAB — CBC
HEMATOCRIT: 34.7 % — AB (ref 35.0–47.0)
HEMOGLOBIN: 12.4 g/dL (ref 12.0–16.0)
MCH: 35.9 pg — AB (ref 26.0–34.0)
MCHC: 35.7 g/dL (ref 32.0–36.0)
MCV: 100.6 fL — AB (ref 80.0–100.0)
Platelets: 188 10*3/uL (ref 150–440)
RBC: 3.45 MIL/uL — ABNORMAL LOW (ref 3.80–5.20)
RDW: 12.7 % (ref 11.5–14.5)
WBC: 2.9 10*3/uL — ABNORMAL LOW (ref 3.6–11.0)

## 2018-04-10 LAB — PROTIME-INR
INR: 2.29
Prothrombin Time: 25 seconds — ABNORMAL HIGH (ref 11.4–15.2)

## 2018-04-10 LAB — SURGICAL PCR SCREEN
MRSA, PCR: NEGATIVE
STAPHYLOCOCCUS AUREUS: NEGATIVE

## 2018-04-10 LAB — APTT: aPTT: 40 seconds — ABNORMAL HIGH (ref 24–36)

## 2018-04-10 NOTE — Pre-Procedure Instructions (Signed)
Recheck PT/APTT on DOS ordered. Abnormal coags faxed to Dr. Sherilyn Dacosta office.

## 2018-04-10 NOTE — Patient Instructions (Signed)
  Your procedure is scheduled on: Wednesday April 24, 2018 Report to Same Day Surgery 2nd floor medical mall ([place] Entrance-take elevator on left to 2nd floor.  Check in with surgery information desk.) To find out your arrival time please call [phone] between 1PM - 3PM on Tuesday April 23, 2018  Remember: Instructions that are not followed completely may result in serious medical risk, up to and including death, or upon the discretion of your surgeon and anesthesiologist your surgery may need to be rescheduled.    _x___ 1. Do not eat food, including mints, candies, or chew gum after midnight the night before your procedure. You may drink clear liquids up to 2 hours before you are scheduled to arrive at the hospital for your procedure.  Do not drink clear liquids within 2 hours of your scheduled arrival to the hospital.  Clear liquids include  --Water or Apple juice without pulp  --Clear carbohydrate beverage such as Gatorade  --Black Coffee or Clear Tea (No milk, no creamers, do not add anything to the coffee or tea   No gum chewing or hard candies.      __x__ 2. No Alcohol for 24 hours before or after surgery.   __x__3. No Smoking or e-cigarettes for 24 prior to surgery.  Do not use any chewable tobacco products for at least 6 hour prior to surgery   ____  4. Bring all medications with you on the day of surgery if instructed.    __x__ 5. Notify your doctor if there is any change in your medical condition     (cold, fever, infections).   __x__6. On the morning of surgery brush your teeth with toothpaste and water.  You may rinse your mouth with mouth wash if you wish.  Do not swallow any toothpaste or mouthwash.   Do not wear jewelry, make-up, hairpins, clips or nail polish.  Do not wear lotions, powders, deodorant, or perfumes.   Do not shave 48 hours prior to surgery.   Do not bring valuables to the hospital.    [facility] is not responsible for any belongings or  valuables.               Contacts, dentures or bridgework may not be worn into surgery.  Leave your suitcase in the car. After surgery it may be brought to your room.  For patients admitted to the hospital, discharge time is determined by your treatment team.  Please read over the following fact sheets that you were given:   [facility] Preparing for Surgery and or MRSA Information   _x___ Take anti-hypertensive listed below, cardiac, seizure, asthma, anti-reflux and psychiatric medicines. These include:  1. Loratadine/Claritin  2. Sertraline/Zoloft  3. Fluticasone/Flonase  _x___ Use CHG Soap or sage wipes as directed on instruction sheet   _x___ Use inhalers (Ventolin) on the day of surgery and bring to hospital day of surgery  _x___ Follow recommendations from Cardiologist, Pulmonologist or PCP regarding stopping Aspirin, Coumadin, Plavix ,Eliquis, Effient, or Pradaxa, and Pletal.  _x___ Stop Anti-inflammatories such as Advil, Aleve, Ibuprofen, Motrin, Naproxen, Naprosyn, Goodies powders or aspirin products. OK to take Tylenol and Celebrex.   _x___ Stop supplements NOW until after surgery (Aloe Vera, Biotin, Probiotic).  But may continue Vitamin D, Vitamin B, and multivitamin.

## 2018-04-12 ENCOUNTER — Encounter: Payer: Self-pay | Admitting: Gastroenterology

## 2018-04-12 ENCOUNTER — Other Ambulatory Visit: Payer: Self-pay

## 2018-04-12 MED ORDER — "SYRINGE 22G X 1"" 3 ML MISC": 0 refills | Status: DC

## 2018-04-12 NOTE — Pre-Procedure Instructions (Signed)
Received recommendation from Dr. Sherilyn Dacosta office on stop date for patient's Xarelto.  Per Judeen Hammans at Dr. Sherilyn Dacosta office, pt states she is confused re: conflicting stop date, 2 vs. 4 days prior to surgery.  Pt has follow-up appt with PCP.

## 2018-04-15 ENCOUNTER — Ambulatory Visit
Admission: RE | Admit: 2018-04-15 | Discharge: 2018-04-15 | Disposition: A | Discharge status: Home / Self Care | Payer: Medicaid Other | Source: Ambulatory Visit | Attending: Urgent Care | Admitting: Urgent Care

## 2018-04-15 DIAGNOSIS — C538 Malignant neoplasm of overlapping sites of cervix uteri: Secondary | ICD-10-CM | POA: Insufficient documentation

## 2018-04-15 DIAGNOSIS — Z923 Personal history of irradiation: Secondary | ICD-10-CM | POA: Diagnosis not present

## 2018-04-15 DIAGNOSIS — I700 Atherosclerosis of aorta: Secondary | ICD-10-CM | POA: Insufficient documentation

## 2018-04-15 DIAGNOSIS — R918 Other nonspecific abnormal finding of lung field: Secondary | ICD-10-CM | POA: Diagnosis not present

## 2018-04-15 DIAGNOSIS — M5136 Other intervertebral disc degeneration, lumbar region: Secondary | ICD-10-CM | POA: Insufficient documentation

## 2018-04-15 MED ORDER — IOPAMIDOL (ISOVUE-300) INJECTION 61%
100.0000 mL | Freq: Once | INTRAVENOUS | Status: AC | PRN
  Administered 2018-04-15: 100 mL via INTRAVENOUS

## 2018-04-16 ENCOUNTER — Encounter: Payer: Self-pay | Admitting: Family Medicine

## 2018-04-16 ENCOUNTER — Telehealth: Payer: Self-pay | Admitting: Family Medicine

## 2018-04-16 NOTE — Telephone Encounter (Signed)
Do you know about this surgical clearance form?

## 2018-04-16 NOTE — Telephone Encounter (Signed)
Copied from [place] [id number]. Topic: General - Other >> Apr 16, 2018  1:12 PM Mylinda Latina, NT wrote: Reason for [id number] calling from  [name] states she didn't received the surgical clearance form back . Please fax to her. The patient has surgery scheduled for 04/24/18   Fax# [phone]

## 2018-04-17 ENCOUNTER — Encounter: Payer: Self-pay | Admitting: Hematology and Oncology

## 2018-04-17 ENCOUNTER — Ambulatory Visit
Admission: RE | Admit: 2018-04-17 | Discharge: 2018-04-17 | Disposition: A | Discharge status: Home / Self Care | Payer: Medicaid Other | Source: Ambulatory Visit | Attending: Urgent Care | Admitting: Urgent Care

## 2018-04-17 ENCOUNTER — Inpatient Hospital Stay: Payer: Medicaid Other | Attending: Hematology and Oncology | Admitting: Hematology and Oncology

## 2018-04-17 VITALS — BP 120/78 | HR 60 | Temp 95.5°F | Resp 18 | Wt 170.4 lb

## 2018-04-17 DIAGNOSIS — R918 Other nonspecific abnormal finding of lung field: Secondary | ICD-10-CM

## 2018-04-17 DIAGNOSIS — Z9221 Personal history of antineoplastic chemotherapy: Secondary | ICD-10-CM | POA: Insufficient documentation

## 2018-04-17 DIAGNOSIS — M79604 Pain in right leg: Secondary | ICD-10-CM | POA: Diagnosis not present

## 2018-04-17 DIAGNOSIS — C530 Malignant neoplasm of endocervix: Secondary | ICD-10-CM

## 2018-04-17 DIAGNOSIS — E538 Deficiency of other specified B group vitamins: Secondary | ICD-10-CM | POA: Diagnosis not present

## 2018-04-17 DIAGNOSIS — D6851 Activated protein C resistance: Secondary | ICD-10-CM | POA: Insufficient documentation

## 2018-04-17 DIAGNOSIS — C539 Malignant neoplasm of cervix uteri, unspecified: Secondary | ICD-10-CM | POA: Insufficient documentation

## 2018-04-17 DIAGNOSIS — Z923 Personal history of irradiation: Secondary | ICD-10-CM | POA: Diagnosis not present

## 2018-04-17 DIAGNOSIS — Z7189 Other specified counseling: Secondary | ICD-10-CM

## 2018-04-17 DIAGNOSIS — C7802 Secondary malignant neoplasm of left lung: Secondary | ICD-10-CM | POA: Diagnosis present

## 2018-04-17 DIAGNOSIS — Z7901 Long term (current) use of anticoagulants: Secondary | ICD-10-CM | POA: Diagnosis not present

## 2018-04-17 DIAGNOSIS — R911 Solitary pulmonary nodule: Secondary | ICD-10-CM

## 2018-04-17 DIAGNOSIS — B182 Chronic viral hepatitis C: Secondary | ICD-10-CM | POA: Insufficient documentation

## 2018-04-17 NOTE — Progress Notes (Signed)
Appetite is not good..  States she does not sleep well at all.  Patient also states she is having knee replacement on July 10th.

## 2018-04-17 NOTE — Progress Notes (Signed)
[facility] day:  04/17/2018   Chief Complaint: Joanna Hall is a 52 y.o. female with a history of cervical cancer s/p chemoradiation who is seen for review of imaging.  HPI:  The patient was last seen in the medical oncology clinic on 03/13/2018.  At that time, she felt better.  LLQ pain had dramatically improved.  Exam revealed minimal LLQ discomfort on palpation.   She is scheduled for left knee replacement on 04/24/2018.  She saw Dr. Sanda Klein on 04/02/2018.  She was noted to have fallen on 03/23/2018 and and fractured her 4th right toe and sprained her left ankle.  She was also planning on hepatitis C treatment after knee surgery.  Chest, abdomen, and pelvis CT on 04/15/2018 revealed innumerable cavitary nodules scattered in the lungs, moderately enlarging compared to the 11/08/2017 PET-CT, suspicious for metastatic disease. There were no new nodules.  There was an ill-defined wall thickening in the rectosigmoid with surrounding stranding along fascia planes, probably sequela from prior radiation therapy.  There was multilevel lumbar impingement due to spondylosis and degenerative disc disease.  There was heterogeneous enhancement in the uterus (some possibly from prior radiation therapy).  During the interim, patient has been fatigued. She is having trouble sleeping. Patient is having some diffuse abdominal discomfort. She is planning to have knee surgery next week with Dr. [name]. She notes that he is wanting to hold her anticoagulation x 5 days, rather than the traditional 2 days. Patient is having pain in her RIGHT thigh area.   Patient denies that she has experienced any B symptoms. She denies any interval infections. Patient advises that she maintains an adequate appetite. She is eating well. Weight today is 170 lb 6.7 oz (77.3 kg), which compared to her last visit to the clinic, represents a  4 pound increase  Patient denies pain in the clinic  today.   Past Medical History:  Diagnosis Date  . Anxiety   . Aortic atherosclerosis ([place])   . Arthritis    neck and knees  . 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 Blood transfusion without reported diagnosis   . Cervical cancer ([place])   . Chronic anal fissure   . Chronic diarrhea   . Dyspnea   . Factor V Leiden mutation ([place])   . Fecal incontinence   . Genital warts   . GERD (gastroesophageal reflux disease)   . Heart murmur   . Hemorrhoids   . 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 History of cancer chemotherapy    completed 06/2017  . History of Clostridium difficile infection    while undergoing chemo.  Negative test 10/2017  . 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 Macrocytic anemia with vitamin B12 deficiency   . Perianal condylomata   . Pneumonia    History of  . Pulmonary nodules   . Small bowel obstruction ([place]) 08/2017  . Stiff neck    limited right turn  . Vitamin D deficiency     Past Surgical History:  Procedure Laterality Date  . CHOLECYSTECTOMY    . COLON SURGERY  08/2017   resection  . COLONOSCOPY WITH PROPOFOL N/A 12/20/2017   Procedure: COLONOSCOPY WITH PROPOFOL;  Surgeon: [name], MD;  Location: [facility] ENDOSCOPY;  Service: Gastroenterology;  Laterality: N/A;  . DIAGNOSTIC LAPAROSCOPY    . ESOPHAGOGASTRODUODENOSCOPY (  EGD) WITH PROPOFOL N/A 12/20/2017   Procedure: ESOPHAGOGASTRODUODENOSCOPY (EGD) WITH PROPOFOL;  Surgeon: [name], MD;  Location: [facility] ENDOSCOPY;  Service: Gastroenterology;  Laterality: N/A;  . LAPAROTOMY N/A 08/31/2017   Procedure: EXPLORATORY LAPAROTOMY for SBO, ileocolectomy, removal of piece of uterine wall;  Surgeon: [name], MD;  Location: [facility] ORS;  Service: General;  Laterality: N/A;  . LASER ABLATION CONDOLAMATA N/A 02/22/2018   Procedure: LASER ABLATION/REMOVAL OF ZWCHENIDPOE AROUND ANUS AND VAGINA;  Surgeon: Michael Boston, MD;  Location:  [place];  Service: General;  Laterality: N/A;  . OOPHORECTOMY    . SMALL INTESTINE SURGERY    . TANDEM RING INSERTION     x3  . THORACOTOMY      Family History  Problem Relation Age of Onset  . Hypertension Father   . Diabetes Father   . Alcohol abuse Daughter   . Hypertension Maternal Grandmother   . Diabetes Maternal Grandmother   . Diabetes Paternal Grandmother   . Hypertension Paternal Grandmother     Social History:  reports that she quit smoking about 11 years ago. She has never used smokeless tobacco. She reports that she drinks alcohol. She reports that she has current or past drug history. Drug: Marijuana.  The patient's 10 year old daughter died of alcoholism.  The patient is originally from [place].  She moved from [place] to [place] 2 weeks ago.  She is alone today.  Allergies:  Allergies  Allergen Reactions  . Ketamine Anxiety and Other (See Comments)    Syncope episode/confusion     Current Medications: Current Outpatient Medications  Medication Sig Dispense Refill  . ALOE VERA CONCENTRATE PO Take 1 tablet by mouth daily.     . Biotin 1000 MCG tablet Take 1,000 mcg by mouth daily.    . cholestyramine (QUESTRAN) 4 g packet Take 1 packet (4 g total) by mouth 3 (three) times daily. (Patient taking differently: Take 4 g by mouth daily. ) 270 packet 0  . cyanocobalamin (,VITAMIN B-12,) 1000 MCG/ML injection Inject 1ML every week x 4 weeks, then 1ML every other week x 2 months, then monthly x 3 months (Patient taking differently: Inject 1,000 mcg into the muscle every 14 (fourteen) days. ) 10 mL 1  . dicyclomine (BENTYL) 10 MG capsule Take 1 capsule (10 mg total) by mouth 4 (four) times daily -  before meals and at bedtime. 360 capsule 1  . Digestive Enzymes (ENZYME DIGEST PO) Take 1 tablet by mouth daily.     . fluticasone (FLONASE) 50 MCG/ACT nasal spray Place 2 sprays into both nostrils daily. (Patient taking differently: Place  2 sprays into both nostrils daily as needed for allergies. ) 16 g 0  . hydrocortisone (ANUSOL-HC) 2.5 % rectal cream Place 1 application rectally 2 (two) times daily. (Patient taking differently: Place 1 application rectally 2 (two) times daily as needed (for rectal discomfort). ) 30 g 0  . loperamide (IMODIUM A-D) 2 MG tablet Take 2 mg by mouth 4 (four) times daily as needed for diarrhea or loose stools.    Marland Kitchen loratadine (CLARITIN) 10 MG tablet Take 10 mg by mouth daily.    . Multiple Vitamin (MULTIVITAMIN WITH MINERALS) TABS tablet Take 1 tablet daily by mouth.    . Probiotic Product (PROBIOMAX DAILY DF PO) Take 1 tablet by mouth daily.     . rivaroxaban (XARELTO) 20 MG TABS tablet Take 1 tablet (20 mg total) by mouth daily with supper. [place]  tablet 1  . sertraline (ZOLOFT) 50 MG tablet Take 1 tablet (50 mg total) by mouth daily. 30 tablet 3  . Syringe/Needle, Disp, (SYRINGE 3CC/22GX1") 22G X 1" 3 ML MISC For use with Vitamin B12 injections 50 each 0  . VENTOLIN HFA 108 (90 Base) MCG/ACT inhaler INHALE 1-2 PUFFS BY MOUTH EVERY 4 HOURS AS NEEDED FOR WHEEZING / SHORTNESS OF BREATH  0  . Vitamin D, Ergocalciferol, (DRISDOL) 50000 units CAPS capsule Take 50,000 Units by mouth every Thursday.    Marland Kitchen oxyCODONE-acetaminophen (PERCOCET/ROXICET) 5-325 MG tablet Take 1 tablet by mouth every 8 (eight) hours as needed for severe pain.     No current facility-administered medications for this visit.     Review of Systems  Constitutional: Positive for malaise/fatigue. Negative for diaphoresis, fever and weight loss (weight up 4 pounds).  HENT: Negative.   Eyes: Negative.   Respiratory: Positive for shortness of breath (with exertion). Negative for cough, hemoptysis and sputum production.   Cardiovascular: Negative for chest pain, palpitations, orthopnea, leg swelling and PND.  Gastrointestinal: Positive for abdominal pain (chronic tenderness). Negative for blood in stool, constipation, diarrhea, melena, nausea  and vomiting.  Genitourinary: Negative for dysuria, frequency, hematuria and urgency.  Musculoskeletal: Positive for joint pain (OA in shoulders and neck. Planned LEFT TKR next week). Negative for back pain, falls and myalgias.       Pain in RIGHT thigh.  Skin: Negative for itching and rash.  Neurological: Negative for dizziness, tremors, weakness and headaches.  Endo/Heme/Allergies: Does not bruise/bleed easily.  Psychiatric/Behavioral: Negative for depression, memory loss and suicidal ideas. The patient has insomnia. The patient is not nervous/anxious.   All other systems reviewed and are negative.  Performance status (ECOG): 1-2  Physical Exam: Blood pressure 120/78, pulse 60, temperature (!) 95.5 F (35.3 C), temperature source Tympanic, resp. rate 18, weight 170 lb 6.7 oz (77.3 kg). GENERAL:  Well developed, well nourished, woman sitting comfortably in the exam room in no acute distress. MENTAL STATUS:  Alert and oriented to person, place and time. HEAD:  Long brown hair.  Normocephalic, atraumatic, face symmetric, no Cushingoid features. EYES:  Blue eyes.  Pupils equal round and reactive to light and accomodation.  No conjunctivitis or scleral icterus. ENT:  Oropharynx clear without lesion.  Tongue normal. Mucous membranes moist.  RESPIRATORY:  Clear to auscultation without rales, wheezes or rhonchi. CARDIOVASCULAR:  Regular rate and rhythm without murmur, rub or gallop. ABDOMEN:  Soft, non-tender, with active bowel sounds, and no hepatosplenomegaly.  No masses. SKIN:  No rashes, ulcers or lesions. EXTREMITIES: No edema, no skin discoloration or tenderness.  No palpable cords. LYMPH NODES: No palpable cervical, supraclavicular, axillary or inguinal adenopathy  NEUROLOGICAL: Unremarkable. PSYCH:  Appropriate.    Imaging studies: 06/2017:  PET scan revealed enlarging pulmonary nodules but no evidence of abdominal disease per patient report. 09/13/2017:  Abdomen and pelvic CT  revealed a small infarct in the inferior pole of the LEFT kidney and RIGHT uterine infarct. 11/08/2017:  PET scan at the [place] [facility] revealed innumerable stable bilateral cavitary pulmonary nodules (up to 8 mm), some of which demonstrate low level metabolic activity.  There were post treatment changes in the pelvis without evidence of suspicious hypermetabolic activity. 12/03/2017:  Abdomen and pelvic CT revealed continued multiple pulmonary nodules in the lung bases concerning for metastatic disease.  There was mild wall thickening of rectosigmoid colon is noted.  There was a stable 1.8 x 1.6 cm mixed fat and  soft tissue density noted in right lower quadrant which may represent fat necrosis.  There was a larger low density is noted involving uterine fundus. 12/03/2017:  Pelvic MRI was unremarkable.   04/15/2018:  Chest, abdomen, and pelvis CT revealed innumerable (> 100) cavitary nodules scattered in the lungs, moderately enlarging compared to the 11/08/2017 PET-CT, suspicious for metastatic disease.  One index node in the RLL measures 1.0 x 1.1 cm (previously 0.6 x 0.6 cm).  There were no new nodules.  There was an ill-defined wall thickening in the rectosigmoid with surrounding stranding along fascia planes, probably sequela from prior radiation therapy.  There was multilevel lumbar impingement due to spondylosis and degenerative disc disease.  There was heterogeneous enhancement in the uterus (some possibly from prior radiation therapy).   No visits with results within 3 Day(s) from this visit.  Latest known visit with results is:  Hospital Outpatient Visit on 04/10/2018  Component Date Value Ref Range Status  . aPTT 04/10/2018 40* 24 - 36 seconds Final   Comment:        IF BASELINE aPTT IS ELEVATED, SUGGEST PATIENT RISK ASSESSMENT BE USED TO DETERMINE APPROPRIATE ANTICOAGULANT THERAPY. Performed at [facility], [address], [place], [place] [address]   .  Sodium 04/10/2018 137  135 - 145 mmol/L Final  . Potassium 04/10/2018 4.2  3.5 - 5.1 mmol/L Final  . Chloride 04/10/2018 106  98 - 111 mmol/L Final   Please note change in reference range.  . CO2 04/10/2018 24  22 - 32 mmol/L Final  . Glucose, Bld 04/10/2018 79  70 - 99 mg/dL Final   Please note change in reference range.  . BUN 04/10/2018 17  6 - 20 mg/dL Final   Please note change in reference range.  . Creatinine, Ser 04/10/2018 0.68  0.44 - 1.00 mg/dL Final  . Calcium 04/10/2018 8.8* 8.9 - 10.3 mg/dL Final  . GFR calc non Af Amer 04/10/2018 >60  >60 mL/min Final  . GFR calc Af Amer 04/10/2018 >60  >60 mL/min Final   Comment: (NOTE) The eGFR has been calculated using the CKD EPI equation. This calculation has not been validated in all clinical situations. eGFR's persistently <60 mL/min signify possible Chronic Kidney Disease.   Georgiann Hahn gap 04/10/2018 7  5 - 15 Final   Performed at [facility], [address] [place], [place] [address]  . WBC 04/10/2018 2.9* 3.6 - 11.0 K/uL Final  . RBC 04/10/2018 3.45* 3.80 - 5.20 MIL/uL Final  . Hemoglobin 04/10/2018 12.4  12.0 - 16.0 g/dL Final  . HCT 04/10/2018 34.7* 35.0 - 47.0 % Final  . MCV 04/10/2018 100.6* 80.0 - 100.0 fL Final  . MCH 04/10/2018 35.9* 26.0 - 34.0 pg Final  . MCHC 04/10/2018 35.7  32.0 - 36.0 g/dL Final  . RDW 04/10/2018 12.7  11.5 - 14.5 % Final  . Platelets 04/10/2018 188  150 - 440 K/uL Final   Performed at [facility], [address] [place], [place] [address]  . Prothrombin Time 04/10/2018 25.0* 11.4 - 15.2 seconds Final  . INR 04/10/2018 2.29   Final   Performed at [facility], [address] [place], [place] [address]  . Color, Urine 04/10/2018 YELLOW* YELLOW Final  . APPearance 04/10/2018 CLEAR* CLEAR Final  . Specific Gravity, Urine 04/10/2018 1.024  1.005 - 1.030 Final  . pH 04/10/2018 5.0  5.0 - 8.0 Final  . Glucose, UA 04/10/2018 NEGATIVE  NEGATIVE mg/dL Final  .  Hgb  urine dipstick 04/10/2018 NEGATIVE  NEGATIVE Final  . Bilirubin Urine 04/10/2018 NEGATIVE  NEGATIVE Final  . Ketones, ur 04/10/2018 NEGATIVE  NEGATIVE mg/dL Final  . Protein, ur 04/10/2018 NEGATIVE  NEGATIVE mg/dL Final  . Nitrite 04/10/2018 NEGATIVE  NEGATIVE Final  . Leukocytes, UA 04/10/2018 NEGATIVE  NEGATIVE Final   Performed at [facility], [address] [place], [place] [address]  . MRSA, PCR 04/10/2018 NEGATIVE  NEGATIVE Final  . Staphylococcus aureus 04/10/2018 NEGATIVE  NEGATIVE Final   Comment: (NOTE) The Xpert SA Assay (FDA approved for NASAL specimens in patients 80 years of age and older), is one component of a comprehensive surveillance program. It is not intended to diagnose infection nor to guide or monitor treatment. Performed at [facility], [address] [place], [place] [address]   . ABO/RH(D) 04/10/2018 O POS   Final  . Antibody Screen 04/10/2018 NEG   Final  . Sample Expiration 04/10/2018 04/24/2018   Final  . Extend sample reason 04/10/2018    Final                   Value:NO TRANSFUSIONS OR PREGNANCY IN THE PAST 3 MONTHS Performed at [facility], [address] [place], [place] [address]     Assessment:  Joanna Hall is a 52 y.o. female with a history of stage IB1 cervical cancer s/p concurrent cisplatin and radiation (external beam and brachytherapy) from 11/2016 - 02/2017.    She was treated by Dr Christene Slates at [facility] in [place].  In 01/2017, she received cisplatin x 2 and carboplatin x 1 (01/29/2017) due to ARF and XRT. Radiation was followed by T & O on 02/01/2017 and T & N on 02/10/2017 and 02/20/2017.  Course was complicated by weight loss (80 pounds), nausea, vomiting, electrolyte wasting (potassium and magnesium), and multiple hospitalizations.  PET scan in 06/2017 revealed enlarging pulmonary nodules but no evidence of abdominal disease per patient report.  She  is s/p right ileocolectomy on 08/31/2017 for a small bowel obstruction.  She presented with abdominal pain.  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  Chest, abdomen, and pelvis CT on 04/15/2018 revealed innumerable (> 100) cavitary nodules scattered in the lungs, moderately enlarging compared to the 11/08/2017 PET-CT, suspicious for metastatic disease. One index node in the RLL measures 1.0 x 1.1 cm.  There were no new nodules.  There was an ill-defined wall thickening in the rectosigmoid with surrounding stranding along fascia planes, probably sequela from prior radiation therapy.  There was multilevel lumbar impingement due to spondylosis and degenerative disc disease.  There was heterogeneous enhancement in the uterus (some possibly from prior radiation therapy).  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  Testing was negative for prothrombin gene mutation, lupus anticoagulant panel, anticardiolipin antibodies, and beta-2 glycoprotein antibodies.  She was discharged on Xarelto.  She was admitted to [facility] from 12/03/2017 - 12/08/2017 with abdominal pain. She was not felt to have a uterine infarct.  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  The etiology was unclear.  She is hepatitis C positive.   Hepatitis C genotype is 2a/2c.  She has B12 deficiency.  B12 was 165 on 11/27/2017.  She began B12 injections.  Symptomatically, she is fatigued.  She has right thigh pain.  Sleep is poor.  She  has chronic abdominal tenderness. Exam is stable.  She is scheduled for left knee replacement.   Plan:   1. Labs today:  CBC with diff, CMP. 2. Discuss interval CT scans- enlarging pulmonary nodules suspicious for metastatic cervical cancer.   3. Present at tumor board on 04/25/2018.  Unclear if peripheral lesion amenable to biopsy. 4. Discuss upcoming  left knee replacement.  With likely active malignancy and heterozygosity for factor V Leiden, patient at risk for thrombosis.  Discuss discontinuation of Xarelto 2-5 days prior to procedure (discuss with Dr Kurtis Bushman) and reinitiation of anticoagulation soon after surgery when cleared by orthopedics. 5. Discuss trying to coordinate lung biopsy off anti-coagulation (prior to upcoming knee surgery). 6. Discuss B12 deficiency and continuation of monthly injections. 7. Schedule RIGHT lower extremity doppler to assess for thrombus on anticoagulation.  8. RTC 1-2 weeks after surgery for MD assessment and review of biopsy.   Addendum:  Right lower extremity duplex today revealed no evidence of DVT.   Honor Loh, NP  04/17/2018, 11:04 AM   I saw and evaluated the patient, participating in the key portions of the service and reviewing pertinent diagnostic studies and records.  I reviewed the nurse practitioner's note and agree with the findings and the plan.  The assessment and plan were discussed with the patient.  Additional diagnostic studies of a PET scan are needed to clarify site for biopsy (recommended by radiology) and would change the clinical management.  Multiple questions were asked by the patient and answered.   Nolon Stalls, MD 04/17/2018,11:04 AM

## 2018-04-17 NOTE — Telephone Encounter (Signed)
I called [name] to see if they can re-fax the surgical clearance form to our office. They stated that Joanna Hall will be sending it today.   Our fax number was confirmed.

## 2018-04-17 NOTE — Telephone Encounter (Signed)
I don't have it

## 2018-04-19 NOTE — Telephone Encounter (Signed)
Signed and in fax tray to be returned Thank you

## 2018-04-19 NOTE — Telephone Encounter (Signed)
Please check the folder, it came after I had left on Wednesday but I think I put it in the folder this morning.

## 2018-04-19 NOTE — Telephone Encounter (Signed)
Form has been given to Dr. Enid Derry to complete

## 2018-04-23 ENCOUNTER — Telehealth: Payer: Self-pay | Admitting: *Deleted

## 2018-04-23 ENCOUNTER — Encounter
Admission: RE | Admit: 2018-04-23 | Discharge: 2018-04-23 | Disposition: A | Discharge status: Home / Self Care | Payer: Medicaid Other | Source: Ambulatory Visit | Attending: Hematology and Oncology | Admitting: Hematology and Oncology

## 2018-04-23 DIAGNOSIS — R911 Solitary pulmonary nodule: Secondary | ICD-10-CM

## 2018-04-23 LAB — GLUCOSE, CAPILLARY: Glucose-Capillary: 80 mg/dL (ref 70–99)

## 2018-04-23 MED ORDER — FLUDEOXYGLUCOSE F - 18 (FDG) INJECTION
8.3200 | Freq: Once | INTRAVENOUS | Status: AC | PRN
  Administered 2018-04-23: 8.32 via INTRAVENOUS

## 2018-04-23 NOTE — Telephone Encounter (Signed)
Rodena Piety....  We have discussed this. If you we give me a form, I will complete and fax.   Thanks, Gaspar Bidding

## 2018-04-23 NOTE — Telephone Encounter (Signed)
Please let them know that we are unable to schedule biopsy without significantly delaying her surgery, which would be being off anticoagulation even longer.   Proceed as scheduled. We will need to send form over for CT guided Bx. Could we perhaps shoot for the 22nd?  Gaspar Bidding

## 2018-04-23 NOTE — Telephone Encounter (Signed)
Called Sherry back @ [name] to inform her that we cannot get patient in for her biopsy before surgery.  Their office should proceed as planned.

## 2018-04-23 NOTE — Telephone Encounter (Signed)
Called Sherry back @ [name] and asked that she resend the form to me.  Gaspar Bidding, NP will fill out and return it today if that is allowed by surgeon  Asked her to call if questions.

## 2018-04-23 NOTE — Telephone Encounter (Signed)
Sherry from [name] called asking about the surgical clearance form that was sent her eon 5/16 and again 7/3 and they have not received back from Dr Mike Gip as of yet. The patient is scheduled for surgery tomorrow and they need this ASAP! Please return her call [phone]

## 2018-04-24 ENCOUNTER — Ambulatory Visit: Payer: Medicaid Other

## 2018-04-24 ENCOUNTER — Encounter
Admission: RE | Disposition: A | Discharge status: Home Health | Payer: Self-pay | Source: Home / Self Care | Attending: Orthopedic Surgery

## 2018-04-24 ENCOUNTER — Encounter: Payer: Self-pay | Admitting: *Deleted

## 2018-04-24 ENCOUNTER — Inpatient Hospital Stay: Payer: Medicaid Other | Admitting: Anesthesiology

## 2018-04-24 ENCOUNTER — Other Ambulatory Visit: Payer: Self-pay

## 2018-04-24 ENCOUNTER — Inpatient Hospital Stay
Admission: RE | Admit: 2018-04-24 | Discharge: 2018-04-27 | DRG: 470 | Disposition: A | Discharge status: Home Health | Payer: Medicaid Other | Attending: Orthopedic Surgery | Admitting: Orthopedic Surgery

## 2018-04-24 ENCOUNTER — Inpatient Hospital Stay: Payer: Medicaid Other

## 2018-04-24 DIAGNOSIS — Z923 Personal history of irradiation: Secondary | ICD-10-CM | POA: Diagnosis not present

## 2018-04-24 DIAGNOSIS — M1712 Unilateral primary osteoarthritis, left knee: Principal | ICD-10-CM | POA: Diagnosis present

## 2018-04-24 DIAGNOSIS — R0789 Other chest pain: Secondary | ICD-10-CM | POA: Diagnosis not present

## 2018-04-24 DIAGNOSIS — Z87891 Personal history of nicotine dependence: Secondary | ICD-10-CM | POA: Diagnosis not present

## 2018-04-24 DIAGNOSIS — E559 Vitamin D deficiency, unspecified: Secondary | ICD-10-CM | POA: Diagnosis present

## 2018-04-24 DIAGNOSIS — Z9049 Acquired absence of other specified parts of digestive tract: Secondary | ICD-10-CM | POA: Diagnosis not present

## 2018-04-24 DIAGNOSIS — I10 Essential (primary) hypertension: Secondary | ICD-10-CM | POA: Diagnosis present

## 2018-04-24 DIAGNOSIS — C539 Malignant neoplasm of cervix uteri, unspecified: Secondary | ICD-10-CM | POA: Diagnosis present

## 2018-04-24 DIAGNOSIS — Z09 Encounter for follow-up examination after completed treatment for conditions other than malignant neoplasm: Secondary | ICD-10-CM

## 2018-04-24 DIAGNOSIS — Z96659 Presence of unspecified artificial knee joint: Secondary | ICD-10-CM

## 2018-04-24 DIAGNOSIS — I739 Peripheral vascular disease, unspecified: Secondary | ICD-10-CM | POA: Diagnosis present

## 2018-04-24 DIAGNOSIS — D6851 Activated protein C resistance: Secondary | ICD-10-CM | POA: Diagnosis present

## 2018-04-24 DIAGNOSIS — I700 Atherosclerosis of aorta: Secondary | ICD-10-CM | POA: Diagnosis present

## 2018-04-24 DIAGNOSIS — R001 Bradycardia, unspecified: Secondary | ICD-10-CM | POA: Diagnosis not present

## 2018-04-24 DIAGNOSIS — Z8249 Family history of ischemic heart disease and other diseases of the circulatory system: Secondary | ICD-10-CM

## 2018-04-24 HISTORY — PX: TOTAL KNEE ARTHROPLASTY: SHX125

## 2018-04-24 LAB — URINE DRUG SCREEN, QUALITATIVE (ARMC ONLY)
AMPHETAMINES, UR SCREEN: NOT DETECTED
BENZODIAZEPINE, UR SCRN: NOT DETECTED
Cannabinoid 50 Ng, Ur ~~LOC~~: POSITIVE — AB
Cocaine Metabolite,Ur ~~LOC~~: NOT DETECTED
MDMA (ECSTASY) UR SCREEN: NOT DETECTED
METHADONE SCREEN, URINE: NOT DETECTED
Opiate, Ur Screen: NOT DETECTED
Phencyclidine (PCP) Ur S: NOT DETECTED
Tricyclic, Ur Screen: NOT DETECTED

## 2018-04-24 LAB — PROTIME-INR
INR: 0.88
PROTHROMBIN TIME: 11.9 s (ref 11.4–15.2)

## 2018-04-24 LAB — ABO/RH: ABO/RH(D): O POS

## 2018-04-24 LAB — APTT: aPTT: 25 seconds (ref 24–36)

## 2018-04-24 SURGERY — ARTHROPLASTY, KNEE, TOTAL
Anesthesia: General | Site: Knee | Laterality: Left | Wound class: Clean

## 2018-04-24 MED ORDER — METOCLOPRAMIDE HCL 5 MG/ML IJ SOLN: 5.0000 mg | Freq: Three times a day (TID) | INTRAMUSCULAR | Status: DC | PRN

## 2018-04-24 MED ORDER — GLYCOPYRROLATE 0.2 MG/ML IJ SOLN
0.2000 mg | Freq: Once | INTRAMUSCULAR | Status: AC
  Administered 2018-04-24: 0.2 mg via INTRAVENOUS

## 2018-04-24 MED ORDER — LIDOCAINE HCL (CARDIAC) PF 100 MG/5ML IV SOSY
PREFILLED_SYRINGE | INTRAVENOUS | Status: DC | PRN
  Administered 2018-04-24: 80 mg via INTRAVENOUS

## 2018-04-24 MED ORDER — PROPOFOL 10 MG/ML IV BOLUS
INTRAVENOUS | Status: AC
  Filled 2018-04-24: qty 20

## 2018-04-24 MED ORDER — TRAMADOL HCL 50 MG PO TABS
50.0000 mg | ORAL_TABLET | Freq: Four times a day (QID) | ORAL | Status: DC
  Administered 2018-04-24 – 2018-04-27 (×11): 50 mg via ORAL
  Filled 2018-04-24 (×13): qty 1

## 2018-04-24 MED ORDER — MIDAZOLAM HCL 2 MG/2ML IJ SOLN
1.0000 mg | Freq: Once | INTRAMUSCULAR | Status: AC
  Administered 2018-04-24: 1 mg via INTRAVENOUS

## 2018-04-24 MED ORDER — BUPIVACAINE-EPINEPHRINE (PF) 0.5% -1:200000 IJ SOLN
INTRAMUSCULAR | Status: DC | PRN
  Administered 2018-04-24: 30 mL

## 2018-04-24 MED ORDER — BIOTIN 1000 MCG PO TABS: 1000.0000 ug | ORAL_TABLET | Freq: Every day | ORAL | Status: DC

## 2018-04-24 MED ORDER — MIDAZOLAM HCL 2 MG/2ML IJ SOLN
INTRAMUSCULAR | Status: AC
  Filled 2018-04-24: qty 2

## 2018-04-24 MED ORDER — CHLORHEXIDINE GLUCONATE 4 % EX LIQD
60.0000 mL | Freq: Once | CUTANEOUS | Status: AC
  Administered 2018-04-24: 4 via TOPICAL

## 2018-04-24 MED ORDER — ACETAMINOPHEN 500 MG PO TABS
1000.0000 mg | ORAL_TABLET | Freq: Four times a day (QID) | ORAL | Status: AC
  Administered 2018-04-24 – 2018-04-25 (×4): 1000 mg via ORAL
  Filled 2018-04-24 (×4): qty 2

## 2018-04-24 MED ORDER — SODIUM CHLORIDE 0.9 % IJ SOLN
INTRAMUSCULAR | Status: AC
  Filled 2018-04-24: qty 50

## 2018-04-24 MED ORDER — OXYCODONE HCL 5 MG PO TABS
5.0000 mg | ORAL_TABLET | ORAL | Status: DC | PRN
  Administered 2018-04-24 (×2): 10 mg via ORAL
  Filled 2018-04-24 (×2): qty 2

## 2018-04-24 MED ORDER — TRANEXAMIC ACID 1000 MG/10ML IV SOLN
1000.0000 mg | INTRAVENOUS | Status: DC
  Filled 2018-04-24: qty 10

## 2018-04-24 MED ORDER — MAGNESIUM CITRATE PO SOLN
1.0000 | Freq: Once | ORAL | Status: DC | PRN
  Filled 2018-04-24: qty 296

## 2018-04-24 MED ORDER — FENTANYL CITRATE (PF) 100 MCG/2ML IJ SOLN
50.0000 ug | Freq: Once | INTRAMUSCULAR | Status: AC
  Administered 2018-04-24: 50 ug via INTRAVENOUS

## 2018-04-24 MED ORDER — ONDANSETRON HCL 4 MG/2ML IJ SOLN
INTRAMUSCULAR | Status: DC | PRN
  Administered 2018-04-24: 4 mg via INTRAVENOUS

## 2018-04-24 MED ORDER — ROPIVACAINE HCL 5 MG/ML IJ SOLN
INTRAMUSCULAR | Status: DC | PRN
  Administered 2018-04-24: 30 mL via EPIDURAL

## 2018-04-24 MED ORDER — ACETAMINOPHEN 325 MG PO TABS
325.0000 mg | ORAL_TABLET | Freq: Four times a day (QID) | ORAL | Status: DC | PRN
  Administered 2018-04-25 – 2018-04-27 (×4): 650 mg via ORAL
  Filled 2018-04-24 (×5): qty 2

## 2018-04-24 MED ORDER — HYDROMORPHONE HCL 1 MG/ML IJ SOLN
0.5000 mg | INTRAMUSCULAR | Status: DC | PRN
  Administered 2018-04-25: 0.5 mg via INTRAVENOUS
  Administered 2018-04-26: 1 mg via INTRAVENOUS
  Filled 2018-04-24 (×4): qty 1

## 2018-04-24 MED ORDER — SODIUM CHLORIDE 0.9 % IR SOLN
Status: DC | PRN
  Administered 2018-04-24: 12:00:00

## 2018-04-24 MED ORDER — SODIUM CHLORIDE 0.9 % IV SOLN
INTRAVENOUS | Status: DC | PRN
  Administered 2018-04-24: 60 mL

## 2018-04-24 MED ORDER — BUPIVACAINE LIPOSOME 1.3 % IJ SUSP
INTRAMUSCULAR | Status: AC
  Filled 2018-04-24: qty 20

## 2018-04-24 MED ORDER — POLYETHYLENE GLYCOL 3350 17 G PO PACK
17.0000 g | PACK | Freq: Every day | ORAL | Status: DC | PRN
  Filled 2018-04-24: qty 1

## 2018-04-24 MED ORDER — MIDAZOLAM HCL 5 MG/5ML IJ SOLN
INTRAMUSCULAR | Status: DC | PRN
  Administered 2018-04-24: 2 mg via INTRAVENOUS

## 2018-04-24 MED ORDER — ACETAMINOPHEN 500 MG PO TABS
1000.0000 mg | ORAL_TABLET | Freq: Once | ORAL | Status: AC
  Administered 2018-04-24: 1000 mg via ORAL

## 2018-04-24 MED ORDER — BUPIVACAINE HCL (PF) 0.5 % IJ SOLN
INTRAMUSCULAR | Status: DC | PRN
  Administered 2018-04-24: 3 mL

## 2018-04-24 MED ORDER — BISACODYL 10 MG RE SUPP: 10.0000 mg | Freq: Every day | RECTAL | Status: DC | PRN

## 2018-04-24 MED ORDER — FAMOTIDINE 20 MG PO TABS
ORAL_TABLET | ORAL | Status: AC
Start: 2018-04-24 — End: 2018-04-24
  Filled 2018-04-24: qty 1

## 2018-04-24 MED ORDER — ALBUTEROL SULFATE (2.5 MG/3ML) 0.083% IN NEBU: 3.0000 mL | INHALATION_SOLUTION | Freq: Four times a day (QID) | RESPIRATORY_TRACT | Status: DC | PRN

## 2018-04-24 MED ORDER — DOCUSATE SODIUM 100 MG PO CAPS
100.0000 mg | ORAL_CAPSULE | Freq: Two times a day (BID) | ORAL | Status: DC
  Administered 2018-04-24 – 2018-04-25 (×2): 100 mg via ORAL
  Filled 2018-04-24 (×4): qty 1

## 2018-04-24 MED ORDER — BUPIVACAINE-EPINEPHRINE (PF) 0.5% -1:200000 IJ SOLN
INTRAMUSCULAR | Status: AC
  Filled 2018-04-24: qty 30

## 2018-04-24 MED ORDER — ROPIVACAINE HCL 5 MG/ML IJ SOLN
INTRAMUSCULAR | Status: AC
  Filled 2018-04-24: qty 30

## 2018-04-24 MED ORDER — ACETAMINOPHEN 500 MG PO TABS
ORAL_TABLET | ORAL | Status: AC
  Filled 2018-04-24: qty 2

## 2018-04-24 MED ORDER — PROPOFOL 500 MG/50ML IV EMUL
INTRAVENOUS | Status: DC | PRN
  Administered 2018-04-24: 100 ug/kg/min via INTRAVENOUS

## 2018-04-24 MED ORDER — ENZYME DIGEST PO CAPS: ORAL_CAPSULE | Freq: Every day | ORAL | Status: DC

## 2018-04-24 MED ORDER — METOCLOPRAMIDE HCL 10 MG PO TABS: 5.0000 mg | ORAL_TABLET | Freq: Three times a day (TID) | ORAL | Status: DC | PRN

## 2018-04-24 MED ORDER — FLUTICASONE PROPIONATE 50 MCG/ACT NA SUSP
2.0000 | Freq: Every day | NASAL | Status: DC | PRN
  Filled 2018-04-24: qty 16

## 2018-04-24 MED ORDER — ONDANSETRON HCL 4 MG PO TABS: 4.0000 mg | ORAL_TABLET | Freq: Four times a day (QID) | ORAL | Status: DC | PRN

## 2018-04-24 MED ORDER — FENTANYL CITRATE (PF) 100 MCG/2ML IJ SOLN
INTRAMUSCULAR | Status: DC | PRN
  Administered 2018-04-24 (×2): 50 ug via INTRAVENOUS

## 2018-04-24 MED ORDER — BACITRACIN 50000 UNITS IM SOLR
INTRAMUSCULAR | Status: AC
  Filled 2018-04-24: qty 2

## 2018-04-24 MED ORDER — GABAPENTIN 300 MG PO CAPS
ORAL_CAPSULE | ORAL | Status: AC
  Filled 2018-04-24: qty 1

## 2018-04-24 MED ORDER — FAMOTIDINE 20 MG PO TABS
20.0000 mg | ORAL_TABLET | Freq: Once | ORAL | Status: AC
  Administered 2018-04-24: 20 mg via ORAL

## 2018-04-24 MED ORDER — ALOE VERA CONCENTRATE 25 MG PO CAPS: ORAL_CAPSULE | Freq: Every day | ORAL | Status: DC

## 2018-04-24 MED ORDER — RIVAROXABAN 20 MG PO TABS
20.0000 mg | ORAL_TABLET | Freq: Every day | ORAL | Status: DC
  Administered 2018-04-25 – 2018-04-27 (×3): 20 mg via ORAL
  Filled 2018-04-24 (×3): qty 1

## 2018-04-24 MED ORDER — CEFAZOLIN SODIUM-DEXTROSE 2-4 GM/100ML-% IV SOLN
2.0000 g | INTRAVENOUS | Status: AC
  Administered 2018-04-24: 2 g via INTRAVENOUS

## 2018-04-24 MED ORDER — FENTANYL CITRATE (PF) 100 MCG/2ML IJ SOLN
INTRAMUSCULAR | Status: AC
  Administered 2018-04-24: 50 ug via INTRAVENOUS
  Filled 2018-04-24: qty 2

## 2018-04-24 MED ORDER — CEFAZOLIN SODIUM-DEXTROSE 1-4 GM/50ML-% IV SOLN
1.0000 g | Freq: Four times a day (QID) | INTRAVENOUS | Status: AC
  Administered 2018-04-24 (×2): 1 g via INTRAVENOUS
  Filled 2018-04-24 (×2): qty 50

## 2018-04-24 MED ORDER — OXYCODONE HCL 5 MG PO TABS
10.0000 mg | ORAL_TABLET | ORAL | Status: DC | PRN
  Administered 2018-04-25: 10 mg via ORAL
  Administered 2018-04-25: 15 mg via ORAL
  Administered 2018-04-25: 10 mg via ORAL
  Administered 2018-04-26 – 2018-04-27 (×7): 15 mg via ORAL
  Filled 2018-04-24 (×5): qty 3
  Filled 2018-04-24: qty 2
  Filled 2018-04-24 (×2): qty 3
  Filled 2018-04-24: qty 2
  Filled 2018-04-24: qty 3

## 2018-04-24 MED ORDER — KETOROLAC TROMETHAMINE 15 MG/ML IJ SOLN
15.0000 mg | Freq: Four times a day (QID) | INTRAMUSCULAR | Status: AC
  Administered 2018-04-24 – 2018-04-25 (×4): 15 mg via INTRAVENOUS
  Filled 2018-04-24 (×5): qty 1

## 2018-04-24 MED ORDER — LIDOCAINE HCL (PF) 1 % IJ SOLN
INTRAMUSCULAR | Status: AC
  Filled 2018-04-24: qty 5

## 2018-04-24 MED ORDER — GABAPENTIN 300 MG PO CAPS
300.0000 mg | ORAL_CAPSULE | Freq: Three times a day (TID) | ORAL | Status: DC
  Administered 2018-04-24 – 2018-04-27 (×9): 300 mg via ORAL
  Filled 2018-04-24 (×9): qty 1

## 2018-04-24 MED ORDER — LACTATED RINGERS IV SOLN
INTRAVENOUS | Status: DC
  Administered 2018-04-24 – 2018-04-25 (×2): via INTRAVENOUS

## 2018-04-24 MED ORDER — MIDAZOLAM HCL 2 MG/2ML IJ SOLN
INTRAMUSCULAR | Status: AC
  Administered 2018-04-24: 1 mg via INTRAVENOUS
  Filled 2018-04-24: qty 2

## 2018-04-24 MED ORDER — ADULT MULTIVITAMIN W/MINERALS CH
1.0000 | ORAL_TABLET | Freq: Every day | ORAL | Status: DC
Start: 2018-04-24 — End: 2018-04-27
  Administered 2018-04-24 – 2018-04-27 (×4): 1 via ORAL
  Filled 2018-04-24 (×4): qty 1

## 2018-04-24 MED ORDER — LORATADINE 10 MG PO TABS
10.0000 mg | ORAL_TABLET | Freq: Every day | ORAL | Status: DC
  Administered 2018-04-24 – 2018-04-27 (×4): 10 mg via ORAL
  Filled 2018-04-24 (×4): qty 1

## 2018-04-24 MED ORDER — FENTANYL CITRATE (PF) 100 MCG/2ML IJ SOLN: 25.0000 ug | INTRAMUSCULAR | Status: DC | PRN

## 2018-04-24 MED ORDER — ONDANSETRON HCL 4 MG/2ML IJ SOLN: 4.0000 mg | Freq: Once | INTRAMUSCULAR | Status: DC | PRN

## 2018-04-24 MED ORDER — KETOROLAC TROMETHAMINE 15 MG/ML IJ SOLN
INTRAMUSCULAR | Status: AC
  Administered 2018-04-24: 15 mg via INTRAVENOUS
  Filled 2018-04-24: qty 1

## 2018-04-24 MED ORDER — PHENOL 1.4 % MT LIQD
1.0000 | OROMUCOSAL | Status: DC | PRN
  Filled 2018-04-24: qty 177

## 2018-04-24 MED ORDER — VITAMIN D (ERGOCALCIFEROL) 1.25 MG (50000 UNIT) PO CAPS: 50000.0000 [IU] | ORAL_CAPSULE | ORAL | Status: DC

## 2018-04-24 MED ORDER — SERTRALINE HCL 50 MG PO TABS
50.0000 mg | ORAL_TABLET | Freq: Every day | ORAL | Status: DC
  Administered 2018-04-24 – 2018-04-27 (×4): 50 mg via ORAL
  Filled 2018-04-24 (×4): qty 1

## 2018-04-24 MED ORDER — DICYCLOMINE HCL 10 MG PO CAPS
10.0000 mg | ORAL_CAPSULE | Freq: Three times a day (TID) | ORAL | Status: DC
  Administered 2018-04-24 – 2018-04-27 (×12): 10 mg via ORAL
  Filled 2018-04-24 (×14): qty 1

## 2018-04-24 MED ORDER — FENTANYL CITRATE (PF) 100 MCG/2ML IJ SOLN
INTRAMUSCULAR | Status: AC
  Filled 2018-04-24: qty 2

## 2018-04-24 MED ORDER — LOPERAMIDE HCL 2 MG PO CAPS
2.0000 mg | ORAL_CAPSULE | Freq: Four times a day (QID) | ORAL | Status: DC | PRN
  Filled 2018-04-24: qty 1

## 2018-04-24 MED ORDER — MENTHOL 3 MG MT LOZG
1.0000 | LOZENGE | OROMUCOSAL | Status: DC | PRN
  Filled 2018-04-24: qty 9

## 2018-04-24 MED ORDER — PROPOFOL 10 MG/ML IV BOLUS
INTRAVENOUS | Status: AC
  Filled 2018-04-24: qty 40

## 2018-04-24 MED ORDER — RISAQUAD PO CAPS
1.0000 | ORAL_CAPSULE | Freq: Every day | ORAL | Status: DC
  Administered 2018-04-24 – 2018-04-27 (×4): 1 via ORAL
  Filled 2018-04-24 (×4): qty 1

## 2018-04-24 MED ORDER — LACTATED RINGERS IV SOLN
INTRAVENOUS | Status: DC
  Administered 2018-04-24 (×2): via INTRAVENOUS

## 2018-04-24 MED ORDER — GLYCOPYRROLATE 0.2 MG/ML IJ SOLN
INTRAMUSCULAR | Status: AC
  Filled 2018-04-24: qty 1

## 2018-04-24 MED ORDER — GABAPENTIN 300 MG PO CAPS
300.0000 mg | ORAL_CAPSULE | Freq: Once | ORAL | Status: AC
  Administered 2018-04-24: 300 mg via ORAL

## 2018-04-24 MED ORDER — CEFAZOLIN SODIUM-DEXTROSE 2-4 GM/100ML-% IV SOLN
INTRAVENOUS | Status: AC
  Filled 2018-04-24: qty 100

## 2018-04-24 MED ORDER — CHOLESTYRAMINE 4 G PO PACK
4.0000 g | PACK | Freq: Every day | ORAL | Status: DC
Start: 2018-04-24 — End: 2018-04-27
  Administered 2018-04-24 – 2018-04-26 (×3): 4 g via ORAL
  Filled 2018-04-24 (×4): qty 1

## 2018-04-24 MED ORDER — ONDANSETRON HCL 4 MG/2ML IJ SOLN
4.0000 mg | Freq: Four times a day (QID) | INTRAMUSCULAR | Status: DC | PRN
  Administered 2018-04-25: 4 mg via INTRAVENOUS
  Filled 2018-04-24: qty 2

## 2018-04-24 SURGICAL SUPPLY — 58 items
BASEPLATE TIBIAL SZ 4 LT (Knees) ×3 IMPLANT
BLADE SAW 1 (BLADE) ×3 IMPLANT
BLADE SAW 18WX90L 1.27 THK (BLADE) ×3 IMPLANT
BLADE SAW SAG 25X90X1.19 (BLADE) ×3 IMPLANT
BOWL CEMENT MIX W/ADAPTER (MISCELLANEOUS) ×3 IMPLANT
BRUSH SCRUB EZ  4% CHG (MISCELLANEOUS) ×4
BRUSH SCRUB EZ 4% CHG (MISCELLANEOUS) ×2 IMPLANT
CANISTER SUCT 1200ML W/VALVE (MISCELLANEOUS) ×3 IMPLANT
CANISTER SUCT 3000ML PPV (MISCELLANEOUS) ×6 IMPLANT
CEMENT BONE 1-PACK (Cement) ×6 IMPLANT
CHLORAPREP W/TINT 26ML (MISCELLANEOUS) ×6 IMPLANT
COMP FEMORAL OXINIUM SZ5 LEFT (Knees) ×3 IMPLANT
COMP PATELLA FENESIS 32 OVAL (Stem) ×3 IMPLANT
COMPONENT FEMRL OXINM SZ5 LEFT (Knees) ×1 IMPLANT
COMPONENT PTLLA GENS 32 OVAL (Stem) ×1 IMPLANT
COOLER POLAR GLACIER W/PUMP (MISCELLANEOUS) ×3 IMPLANT
CUFF TOURN 30 STER DUAL PORT (MISCELLANEOUS) ×3 IMPLANT
DRAPE INCISE IOBAN 66X60 STRL (DRAPES) ×3 IMPLANT
DRAPE SHEET LG 3/4 BI-LAMINATE (DRAPES) ×3 IMPLANT
DRSG AQUACEL AG ADV 3.5X14 (GAUZE/BANDAGES/DRESSINGS) ×3 IMPLANT
ELECT REM PT RETURN 9FT ADLT (ELECTROSURGICAL) ×3
ELECTRODE REM PT RTRN 9FT ADLT (ELECTROSURGICAL) ×1 IMPLANT
GAUZE PETRO XEROFOAM 1X8 (MISCELLANEOUS) ×3 IMPLANT
GLOVE INDICATOR 8.0 STRL GRN (GLOVE) ×3 IMPLANT
GLOVE SURG ORTHO 8.0 STRL STRW (GLOVE) ×6 IMPLANT
GOWN STRL REUS W/ TWL LRG LVL3 (GOWN DISPOSABLE) ×2 IMPLANT
GOWN STRL REUS W/ TWL XL LVL3 (GOWN DISPOSABLE) ×1 IMPLANT
GOWN STRL REUS W/TWL LRG LVL3 (GOWN DISPOSABLE) ×4
GOWN STRL REUS W/TWL XL LVL3 (GOWN DISPOSABLE) ×2
HOOD PEEL AWAY FLYTE STAYCOOL (MISCELLANEOUS) ×9 IMPLANT
INSERT ARTI HI FLEX 9 SZ 3-4 (Insert) ×3 IMPLANT
IV NS 1000ML (IV SOLUTION) ×2
IV NS 1000ML BAXH (IV SOLUTION) ×1 IMPLANT
KIT TURNOVER KIT A (KITS) ×3 IMPLANT
MAT ABSORB  FLUID 56X50 GRAY (MISCELLANEOUS) ×2
MAT ABSORB FLUID 56X50 GRAY (MISCELLANEOUS) ×1 IMPLANT
NDL SAFETY ECLIPSE 18X1.5 (NEEDLE) ×1 IMPLANT
NEEDLE HYPO 18GX1.5 SHARP (NEEDLE) ×2
NEEDLE SPNL 20GX3.5 QUINCKE YW (NEEDLE) ×3 IMPLANT
NS IRRIG 1000ML POUR BTL (IV SOLUTION) ×3 IMPLANT
PACK TOTAL KNEE (MISCELLANEOUS) ×3 IMPLANT
PAD DE MAYO PRESSURE PROTECT (MISCELLANEOUS) ×6 IMPLANT
PAD WRAPON POLAR KNEE (MISCELLANEOUS) ×1 IMPLANT
PIN TROCAR 3 INCH (PIN) ×3 IMPLANT
PULSAVAC PLUS IRRIG FAN TIP (DISPOSABLE) ×6
STAPLER SKIN PROX 35W (STAPLE) ×3 IMPLANT
SUCTION FRAZIER HANDLE 10FR (MISCELLANEOUS) ×2
SUCTION TUBE FRAZIER 10FR DISP (MISCELLANEOUS) ×1 IMPLANT
SUT BONE WAX W31G (SUTURE) ×3 IMPLANT
SUT DVC 2 QUILL PDO  T11 36X36 (SUTURE) ×2
SUT DVC 2 QUILL PDO T11 36X36 (SUTURE) ×1 IMPLANT
SUT VIC AB 2-0 CT1 18 (SUTURE) ×3 IMPLANT
SUT VIC AB 2-0 CT1 27 (SUTURE) ×2
SUT VIC AB 2-0 CT1 TAPERPNT 27 (SUTURE) ×1 IMPLANT
SUT VIC AB PLUS 45CM 1-MO-4 (SUTURE) ×3 IMPLANT
SYR 30ML LL (SYRINGE) ×6 IMPLANT
TIP FAN IRRIG PULSAVAC PLUS (DISPOSABLE) ×2 IMPLANT
WRAPON POLAR PAD KNEE (MISCELLANEOUS) ×3

## 2018-04-24 NOTE — Anesthesia Preprocedure Evaluation (Addendum)
Anesthesia Evaluation  Patient identified by MRN, date of birth, ID band Patient awake    Reviewed: Allergy & Precautions, NPO status , Patient's Chart, lab work & pertinent test results  Airway Mallampati: II  TM Distance: <3 FB     Dental  (+) Chipped, Missing, Upper Dentures   Pulmonary neg pulmonary ROS, shortness of breath and with exertion, pneumonia, resolved, former smoker,    Pulmonary exam normal        Cardiovascular hypertension, Pt. on medications + Peripheral Vascular Disease  Normal cardiovascular exam+ Valvular Problems/Murmurs      Neuro/Psych PSYCHIATRIC DISORDERS Anxiety negative neurological ROS     GI/Hepatic negative GI ROS, Neg liver ROS, GERD  Medicated,(+) Hepatitis -, C  Endo/Other  negative endocrine ROS  Renal/GU Renal diseasenegative Renal ROS  negative genitourinary   Musculoskeletal negative musculoskeletal ROS (+) Arthritis , Osteoarthritis,    Abdominal Normal abdominal exam  (+)   Peds negative pediatric ROS (+)  Hematology negative hematology ROS (+) anemia ,   Anesthesia Other Findings Past Medical History: No date: Anxiety No date: Cancer ([place]) No date: Hypertension  Reproductive/Obstetrics                             Anesthesia Physical  Anesthesia Plan  ASA: III and emergent  Anesthesia Plan: Spinal   Post-op Pain Management:  Regional for Post-op pain   Induction: Intravenous  PONV Risk Score and Plan:   Airway Management Planned: Nasal Cannula  Additional Equipment:   Intra-op Plan:   Post-operative Plan:   Informed Consent: I have reviewed the patients History and Physical, chart, labs and discussed the procedure including the risks, benefits and alternatives for the proposed anesthesia with the patient or authorized representative who has indicated his/her understanding and acceptance.   Dental advisory given  Plan Discussed  with: CRNA and Surgeon  Anesthesia Plan Comments: (Talked with patient about doing an adductor canal block for post op pain relief.  Patient agrees with plan and wishes to proceed.  Spinal for surgical procedure.)      Anesthesia Quick Evaluation

## 2018-04-24 NOTE — Progress Notes (Signed)
Chaplain met with patient to discuss AD.  The patient was familiar with the AD but was unsure about how they functioned. After education, the patient was comfortable moving forward. 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  Patient will review the AD with her family and move towards completion on 04/25/2018. Education complete. OR closed.

## 2018-04-24 NOTE — Anesthesia Procedure Notes (Signed)
Anesthesia Regional Block: Adductor canal block   Pre-Anesthetic Checklist: ,, timeout performed, Correct Patient, Correct Site, Correct Laterality, Correct Procedure, Correct Position, site marked, Risks and benefits discussed,  Surgical consent,  Pre-op evaluation,  At surgeon's request and post-op pain management  Laterality: Lower  Prep: chloraprep       Needles:  Injection technique: Single-shot  Needle Type: Echogenic Needle     Needle Length: 9cm  Needle Gauge: 21     Additional Needles:   Procedures:,,,, ultrasound used (permanent image in chart),,,,  Narrative:  Start time: 04/24/2018 9:25 AM End time: 04/24/2018 9:30 AM Injection made incrementally with aspirations every 5 mL.  Performed by: Personally  Anesthesiologist: Alvin Critchley, MD  Additional Notes: Functioning IV was confirmed and monitors were applied.  A echogenic needle was used. Sterile prep,hand hygiene and sterile gloves were used. Minimal sedation used for procedure.   No paresthesia endorsed by patient during the procedure.  Negative aspiration and negative test dose prior to incremental administration of local anesthetic. The patient tolerated the procedure well with no immediate complications.

## 2018-04-24 NOTE — H&P (Signed)
The patient has been re-examined, and the chart reviewed, and there have been no interval changes to the documented history and physical.  Plan a left total knee replacement today.  Anesthesia is consulted regarding a peripheral nerve block for post-operative pain.  The risks, benefits, and alternatives have been discussed at length, and the patient is willing to proceed.

## 2018-04-24 NOTE — Progress Notes (Signed)
PHARMACIST - PHYSICIAN ORDER COMMUNICATION  CONCERNING: P&T Medication Policy on Herbal Medications  DESCRIPTION:  This patient's orders for: enzyme digest capsules, aloe vera concentrate, and biotin 1000 mcg tablets.  has been noted.  This product(s) is classified as an "herbal" or natural product. Due to a lack of definitive safety studies or FDA approval, nonstandard manufacturing practices, plus the potential risk of unknown drug-drug interactions while on inpatient medications, the Pharmacy and Therapeutics Committee does not permit the use of "herbal" or natural products of this type within [facility].   ACTION TAKEN: The pharmacy department is unable to verify this order at this time. Please reevaluate patient's clinical condition at discharge and address if the herbal or natural product(s) should be resumed at that time.   Paticia Stack, PharmD Pharmacy Resident  04/24/2018 3:34 PM

## 2018-04-24 NOTE — Progress Notes (Signed)
Pt admitted to room 135 via hospital bed from PACU without incident per MD order. Rept received from PACU RN. Pt alert and oriented. No s/sx distress and no c/o such. Vital signs stable. Pt had block in OR. Pt pain controlled on admission to the floor. Pt oriented to room, unit routines and meal schedules. Family at bedside and supportive. Pt is due to void. Pt, per rept, had in and out cath prior to admit to floor in PACU. Will continue to monitor.

## 2018-04-24 NOTE — Consult Note (Signed)
[facility] Cardiology  CARDIOLOGY CONSULT NOTE  Patient ID: Joanna Hall MRN: [id number] DOB/AGE: Nov 03, 1965 52 y.o.  Admit date: 04/24/2018 Referring Physician Dr. Kayleen Memos  Primary Physician Dr. Enid Derry  Primary Cardiologist N/A Reason for Consultation Chest pain, bradycardia   HPI: [name] is a 52 year old female with a history of hypertension, aortic atherosclerosis, and factor V leiden who underwent left knee arthoplasty with Dr. [name] on 04/24/18. Post operatively, patient was bradycardic and complained of chest cramping, warranting a cardiology consult. Patient complains of left-sided chest cramping which has slightly improved over the last several minutes. She states that this cramping sensation has happened following previous surgeries in the past. Pain does not radiate to arm or jaw; no diaphoresis. She also complains of lightheadedness but denies dizziness, shortness of breath, palpitations, or lower extremity swelling.   [name] has no significant cardiac history and is not followed by a cardiologist.   Review of systems complete and found to be negative unless listed above     Past Medical History:  Diagnosis Date  . Anxiety   . Aortic atherosclerosis ([place])   . Arthritis    neck and knees  . 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 Blood transfusion without reported diagnosis   . Cervical cancer ([place])   . Chronic anal fissure   . Chronic diarrhea   . Dyspnea   . Factor V Leiden mutation ([employer])   . Fecal incontinence   . Genital warts   . GERD (gastroesophageal reflux disease)   . Heart murmur   . Hemorrhoids   . 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 History of cancer chemotherapy    completed 06/2017  . History of Clostridium difficile infection    while undergoing chemo.  Negative test 10/2017  . 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 Macrocytic anemia with vitamin B12 deficiency   .  Perianal condylomata   . Pneumonia    History of  . Pulmonary nodules   . Small bowel obstruction ([place]) 08/2017  . Stiff neck    limited right turn  . Vitamin D deficiency     Past Surgical History:  Procedure Laterality Date  . CHOLECYSTECTOMY    . COLON SURGERY  08/2017   resection  . COLONOSCOPY WITH PROPOFOL N/A 12/20/2017   Procedure: COLONOSCOPY WITH PROPOFOL;  Surgeon: [name], MD;  Location: [facility] ENDOSCOPY;  Service: Gastroenterology;  Laterality: N/A;  . DIAGNOSTIC LAPAROSCOPY    . ESOPHAGOGASTRODUODENOSCOPY (EGD) WITH PROPOFOL N/A 12/20/2017   Procedure: ESOPHAGOGASTRODUODENOSCOPY (EGD) WITH PROPOFOL;  Surgeon: [name], MD;  Location: [place];  Service: Gastroenterology;  Laterality: N/A;  . LAPAROTOMY N/A 08/31/2017   Procedure: EXPLORATORY LAPAROTOMY for SBO, ileocolectomy, removal of piece of uterine wall;  Surgeon: [name], MD;  Location: [facility] ORS;  Service: General;  Laterality: N/A;  . LASER ABLATION CONDOLAMATA N/A 02/22/2018   Procedure: LASER ABLATION/REMOVAL OF [id number] AROUND ANUS AND VAGINA;  Surgeon: Michael Boston, MD;  Location: [place];  Service: General;  Laterality: N/A;  . OOPHORECTOMY    . SMALL INTESTINE SURGERY    . TANDEM RING INSERTION     x3  . THORACOTOMY      Medications Prior to Admission  Medication Sig Dispense Refill Last Dose  . ALOE VERA CONCENTRATE PO Take 1 tablet by mouth daily.  04/21/2018  . Biotin 1000 MCG tablet Take 1,000 mcg by mouth daily.   04/21/2018  . cholestyramine (QUESTRAN) 4 g packet Take 1 packet (4 g total) by mouth 3 (three) times daily. (Patient taking differently: Take 4 g by mouth daily. ) 270 packet 0 04/23/2018 at Unknown time  . cyanocobalamin (,VITAMIN B-12,) 1000 MCG/ML injection Inject 1ML every week x 4 weeks, then 1ML every other week x 2 months, then monthly x 3 months (Patient taking differently: Inject 1,000 mcg into the muscle every 14 (fourteen) days. ) 10  mL 1 04/23/2018 at Unknown time  . dicyclomine (BENTYL) 10 MG capsule Take 1 capsule (10 mg total) by mouth 4 (four) times daily -  before meals and at bedtime. 360 capsule 1 04/23/2018 at Unknown time  . fluticasone (FLONASE) 50 MCG/ACT nasal spray Place 2 sprays into both nostrils daily. (Patient taking differently: Place 2 sprays into both nostrils daily as needed for allergies. ) 16 g 0 04/21/2018  . hydrocortisone (ANUSOL-HC) 2.5 % rectal cream Place 1 application rectally 2 (two) times daily. (Patient taking differently: Place 1 application rectally 2 (two) times daily as needed (for rectal discomfort). ) 30 g 0 Past Month at Unknown time  . loperamide (IMODIUM A-D) 2 MG tablet Take 2 mg by mouth 4 (four) times daily as needed for diarrhea or loose stools.   04/21/2018  . loratadine (CLARITIN) 10 MG tablet Take 10 mg by mouth daily.   04/22/2018  . Multiple Vitamin (MULTIVITAMIN WITH MINERALS) TABS tablet Take 1 tablet daily by mouth.   04/21/2018  . Probiotic Product (PROBIOMAX DAILY DF PO) Take 1 tablet by mouth daily.    04/21/2018  . rivaroxaban (XARELTO) 20 MG TABS tablet Take 1 tablet (20 mg total) by mouth daily with supper. 30 tablet 1 04/19/2018  . sertraline (ZOLOFT) 50 MG tablet Take 1 tablet (50 mg total) by mouth daily. 30 tablet 3 04/23/2018 at Unknown time  . VENTOLIN HFA 108 (90 Base) MCG/ACT inhaler INHALE 1-2 PUFFS BY MOUTH EVERY 4 HOURS AS NEEDED FOR WHEEZING / SHORTNESS OF BREATH  0 04/24/2018 at 0820  . Vitamin D, Ergocalciferol, (DRISDOL) 50000 units CAPS capsule Take 50,000 Units by mouth every Thursday.   04/22/2018  . Digestive Enzymes (ENZYME DIGEST PO) Take 1 tablet by mouth daily.    04/21/2018  . oxyCODONE-acetaminophen (PERCOCET/ROXICET) 5-325 MG tablet Take 1 tablet by mouth every 8 (eight) hours as needed for severe pain.   Not Taking at Unknown time  . Syringe/Needle, Disp, (SYRINGE 3CC/22GX1") 22G X 1" 3 ML MISC For use with Vitamin B12 injections 50 each 0 Taking   Social History    Socioeconomic History  . Marital status: Legally Separated    Spouse name: Not on file  . Number of children: Not on file  . Years of education: Not on file  . Highest education level: Not on file  Occupational History  . Not on file  Social Needs  . Financial resource strain: Not on file  . Food insecurity:    Worry: Not on file    Inability: Not on file  . Transportation needs:    Medical: Not on file    Non-medical: Not on file  Tobacco Use  . Smoking status: Former Smoker    Last attempt to quit: 10/16/2006    Years since quitting: 11.5  . Smokeless tobacco: Never Used  Substance and Sexual Activity  . Alcohol use: Yes    Frequency: Never  Comment: seldom  . Drug use: Yes    Types: Marijuana  . Sexual activity: Not Currently    Birth control/protection: Post-menopausal    Comment: Not Asked  Lifestyle  . Physical activity:    Days per week: Not on file    Minutes per session: Not on file  . Stress: Not on file  Relationships  . Social connections:    Talks on phone: Not on file    Gets together: Not on file    Attends religious service: Not on file    Active member of club or organization: Not on file    Attends meetings of clubs or organizations: Not on file    Relationship status: Not on file  . Intimate partner violence:    Fear of current or ex partner: Not on file    Emotionally abused: Not on file    Physically abused: Not on file    Forced sexual activity: Not on file  Other Topics Concern  . Not on file  Social History Narrative  . Not on file    Family History  Problem Relation Age of Onset  . Hypertension Father   . Diabetes Father   . Alcohol abuse Daughter   . Hypertension Maternal Grandmother   . Diabetes Maternal Grandmother   . Diabetes Paternal Grandmother   . Hypertension Paternal Grandmother       Review of systems complete and found to be negative unless listed above      PHYSICAL EXAM  General: Well developed, well  nourished, in no acute distress HEENT:  Normocephalic and atramatic Neck:  No JVD.  Lungs: Clear bilaterally to auscultation and percussion. Heart: Bradycardic, regular rhythm. Normal S1 and S2 without gallops or murmurs.  Abdomen: Bowel sounds are positive, abdomen soft and non-tender  Msk:  Back normal. Normal strength and tone for age. Extremities: No clubbing, cyanosis or edema.   Neuro: Alert and oriented X 3. Psych:  Good affect, responds appropriately  Labs:   Lab Results  Component Value Date   WBC 2.9 (L) 04/10/2018   HGB 12.4 04/10/2018   HCT 34.7 (L) 04/10/2018   MCV 100.6 (H) 04/10/2018   PLT 188 04/10/2018   No results for input(s): NA, K, CL, CO2, BUN, CREATININE, CALCIUM, PROT, BILITOT, ALKPHOS, ALT, AST, GLUCOSE in the last 168 hours.  Invalid input(s): LABALBU No results found for: CKTOTAL, CKMB, CKMBINDEX, TROPONINI  Lab Results  Component Value Date   CHOL 149 02/11/2018   Lab Results  Component Value Date   HDL 68 02/11/2018   Lab Results  Component Value Date   LDLCALC 28 02/11/2018   Lab Results  Component Value Date   TRIG 266 (H) 02/11/2018   TRIG 84 09/03/2017   TRIG 44 09/01/2017   Lab Results  Component Value Date   CHOLHDL 2.2 02/11/2018   No results found for: LDLDIRECT    Radiology: Ct Chest W Contrast  Result Date: 04/15/2018 CLINICAL DATA:  Urinary frequency for several days. Cervical cancer restaging. Lung nodules. EXAM: CT CHEST, ABDOMEN, AND PELVIS WITH CONTRAST TECHNIQUE: Multidetector CT imaging of the chest, abdomen and pelvis was performed following the standard protocol during bolus administration of intravenous contrast. CONTRAST:  142m ISOVUE-300 IOPAMIDOL (ISOVUE-300) INJECTION 61% COMPARISON:  Multiple exams, including PET-CT from 11/08/2017 FINDINGS: CT CHEST FINDINGS Cardiovascular: Aortic arch atherosclerotic calcification. Mediastinum/Nodes: Unremarkable Lungs/Pleura: Numerous scattered enlarging pulmonary nodules are  present. One index nodule in the right lower lobe on image 79/4 measures 1.0  by 1.1 cm, previously 0.6 by 0.6 cm. Many of the nodules demonstrate increased cavitation, and most of the nodules are cavitary. The vast majority of the nodules appear to have been present previously. Over 100 pulmonary nodules are present. Musculoskeletal: Unremarkable CT ABDOMEN PELVIS FINDINGS Hepatobiliary: Cholecystectomy. Common bile duct 1.1 cm in diameter, stable. Pancreas: Unremarkable Spleen: Unremarkable Adrenals/Urinary Tract: Unremarkable Stomach/Bowel: Ill-defined wall thickening in the rectosigmoid as before, with surrounding perirectal edema and edema tracking along perirectal fascia planes. Right hemicolectomy. Vascular/Lymphatic: Aortoiliac atherosclerotic vascular disease. Portacaval node 1.1 cm, roughly stable. Small periaortic lymph nodes are present. Reproductive: Heterogeneous enhancement in the uterus. Other: No supplemental non-categorized findings. Musculoskeletal: Lumbar spondylosis and degenerative disc disease noted with suspected left impingement at L1-2 and L5-S1, and suspected right impingement at L3-4, L4-5, and L5-S1. IMPRESSION: 1. Innumerable cavitary nodules scattered in the lungs, moderately enlarging compared to the 11/08/2017 PET-CT, suspicious for metastatic disease. I do not see any definite new nodules. 2. Ill-defined wall thickening in the rectosigmoid with surrounding stranding along fascia planes, probably sequela from prior radiation therapy. 3. Multilevel lumbar impingement due to spondylosis and degenerative disc disease. 4.  Aortic Atherosclerosis (ICD10-I70.0). 5. Heterogeneous enhancement in the uterus. Some of this may be from prior radiation therapy. Electronically Signed   By: Van Clines M.D.   On: 04/15/2018 12:25   Ct Abdomen Pelvis W Contrast  Result Date: 04/15/2018 CLINICAL DATA:  Urinary frequency for several days. Cervical cancer restaging. Lung nodules. EXAM: CT  CHEST, ABDOMEN, AND PELVIS WITH CONTRAST TECHNIQUE: Multidetector CT imaging of the chest, abdomen and pelvis was performed following the standard protocol during bolus administration of intravenous contrast. CONTRAST:  131m ISOVUE-300 IOPAMIDOL (ISOVUE-300) INJECTION 61% COMPARISON:  Multiple exams, including PET-CT from 11/08/2017 FINDINGS: CT CHEST FINDINGS Cardiovascular: Aortic arch atherosclerotic calcification. Mediastinum/Nodes: Unremarkable Lungs/Pleura: Numerous scattered enlarging pulmonary nodules are present. One index nodule in the right lower lobe on image 79/4 measures 1.0 by 1.1 cm, previously 0.6 by 0.6 cm. Many of the nodules demonstrate increased cavitation, and most of the nodules are cavitary. The vast majority of the nodules appear to have been present previously. Over 100 pulmonary nodules are present. Musculoskeletal: Unremarkable CT ABDOMEN PELVIS FINDINGS Hepatobiliary: Cholecystectomy. Common bile duct 1.1 cm in diameter, stable. Pancreas: Unremarkable Spleen: Unremarkable Adrenals/Urinary Tract: Unremarkable Stomach/Bowel: Ill-defined wall thickening in the rectosigmoid as before, with surrounding perirectal edema and edema tracking along perirectal fascia planes. Right hemicolectomy. Vascular/Lymphatic: Aortoiliac atherosclerotic vascular disease. Portacaval node 1.1 cm, roughly stable. Small periaortic lymph nodes are present. Reproductive: Heterogeneous enhancement in the uterus. Other: No supplemental non-categorized findings. Musculoskeletal: Lumbar spondylosis and degenerative disc disease noted with suspected left impingement at L1-2 and L5-S1, and suspected right impingement at L3-4, L4-5, and L5-S1. IMPRESSION: 1. Innumerable cavitary nodules scattered in the lungs, moderately enlarging compared to the 11/08/2017 PET-CT, suspicious for metastatic disease. I do not see any definite new nodules. 2. Ill-defined wall thickening in the rectosigmoid with surrounding stranding along  fascia planes, probably sequela from prior radiation therapy. 3. Multilevel lumbar impingement due to spondylosis and degenerative disc disease. 4.  Aortic Atherosclerosis (ICD10-I70.0). 5. Heterogeneous enhancement in the uterus. Some of this may be from prior radiation therapy. Electronically Signed   By: WVan ClinesM.D.   On: 04/15/2018 12:25   Nm Pet Image Initial (pi) Skull Base To Thigh  Result Date: 04/23/2018 CLINICAL DATA:  Subsequent treatment strategy for cervical cancer. Multiple pulmonary nodules. EXAM: NUCLEAR MEDICINE PET SKULL BASE TO THIGH TECHNIQUE:  8.3 mCi F-18 FDG was injected intravenously. Full-ring PET imaging was performed from the skull base to thigh after the radiotracer. CT data was obtained and used for attenuation correction and anatomic localization. Fasting blood glucose: 80 mg/dl COMPARISON:  Multiple exams, including CT scan of 04/15/2018 and outside PET-CT from [place] [facility] [place] dated 11/08/2017 FINDINGS: Mediastinal blood pool activity: SUV max 2.5 NECK: No significant abnormal hypermetabolic activity in this region. Incidental CT findings: Mild chronic right maxillary sinusitis. CHEST: Roughly similar appearance of innumerable solid and cavitary nodules scattered in both lungs. When I compare back to the prior PET-CT from 11/08/2017 these nodules have clearly increased in size and have also had some increase in activity, although hypermetabolic activity remains relatively low-grade. For example, an index nodule in the left upper lobe on image 83/4 appears solid and measures 1.1 by 0.9 cm, previously 0.6 by 0.7 cm, with maximum standard uptake value 1.9, formerly 0.9. A 7 mm nodule in the right lower lobe has a maximum SUV of 2.3. Other nodules likewise have low-grade metabolic activity. Most of the nodules or at or below 1 cm in diameter. No enlarged or hypermetabolic lymph nodes in the chest. Incidental CT findings: Aortic arch atherosclerotic  vascular disease. ABDOMEN/PELVIS: Physiologic accentuation of activity in segments of bowel. This primarily involves the colon. No appreciable uterine hypermetabolic activity. Upper normal sized porta hepatis lymph nodes are not hypermetabolic. No pathologic retroperitoneal or pelvic adenopathy. Incidental CT findings: Cholecystectomy. Aortoiliac atherosclerotic vascular disease. Postoperative findings in the right colon. Presacral stranding, likely due to prior radiation therapy. SKELETON: No significant abnormal hypermetabolic activity in this region. Incidental CT findings: Dental cavities noted. IMPRESSION: 1. The numerous scattered solid and cavitary nodules in the lungs are essentially stable from 04/15/2018 but have increased in size compared to the prior PET-CT from 11/08/2017. These demonstrate low-grade metabolic activity up to a maximum SUV of about 2.3, which is also increased from 11/08/2017. Appearance favors cavitary malignancy over infectious processes such as fungal disease. 2. Post therapy related findings in the pelvis. 3. Other imaging findings of potential clinical significance: Mild chronic right maxillary sinusitis. Aortic Atherosclerosis (ICD10-I70.0). Dental cavities. Electronically Signed   By: Van Clines M.D.   On: 04/23/2018 11:55   US Venous Img Lower Unilateral Right  Result Date: 04/17/2018 CLINICAL DATA:  Pain x2 weeks EXAM: RIGHT LOWER EXTREMITY VENOUS DOPPLER ULTRASOUND TECHNIQUE: Gray-scale sonography with compression, as well as color and duplex ultrasound, were performed to evaluate the deep venous system from the level of the common femoral vein through the popliteal and proximal calf veins. COMPARISON:  None FINDINGS: Normal compressibility of the common femoral, superficial femoral, and popliteal veins, as well as the proximal calf veins. No filling defects to suggest DVT on grayscale or color Doppler imaging. Doppler waveforms show normal direction of venous flow,  normal respiratory phasicity and response to augmentation. Survey views of the contralateral common femoral vein are unremarkable. IMPRESSION: No evidence of right lower extremity deep vein thrombosis. Electronically Signed   By: Lucrezia Europe M.D.   On: 04/17/2018 14:30   Dg Knee Left Port  Result Date: 04/24/2018 CLINICAL DATA:  Status post left total knee joint prosthesis placement. EXAM: PORTABLE LEFT KNEE - 1-2 VIEW COMPARISON:  MRI of the left knee of Feb 26, 2018 FINDINGS: The patient has undergone total knee joint prosthesis placement. Radiographic positioning of the prosthetic components is good. The interface with the native bone appears normal. There is no acute native bone  fracture. Surgical skin staples are present. IMPRESSION: No immediate postprocedure complication following left total knee joint prosthesis placement. Electronically Signed   By: David  Martinique M.D.   On: 04/24/2018 12:54    EKG: Sinus Bradycardia rate at 54  ASSESSMENT AND PLAN:  1. Bradycardia   - Continue to monitor, no further treatment recommended at this time  2. Atypical chest pain   - No ischemic changes noted on ECG  - Will continue to monitor   - Follow up outpatient cardiology as needed   The history, physical exam findings, and plan of care were discussed with Dr. Bartholome Bill and all decision making was made in collaboration.   Signed: Avie Arenas PA-C 04/24/2018, 3:21 PM

## 2018-04-24 NOTE — Anesthesia Post-op Follow-up Note (Signed)
Anesthesia QCDR form completed.        

## 2018-04-24 NOTE — Progress Notes (Signed)
PT Cancellation Note  Patient Details Name: Joanna Hall MRN: [id number] DOB: 1966/10/14   Cancelled Treatment:    Reason Eval/Treat Not Completed: Patient not medically ready(Pt still lacking full sensation/sensory awareness to surgical extremitiy.  Will reattampt next date as medically appropriate )  Hortencia Conradi, SPT 04/24/18,4:31 PM

## 2018-04-24 NOTE — Transfer of Care (Signed)
Immediate Anesthesia Transfer of Care Note  Patient: Joanna Hall  Procedure(s) Performed: TOTAL KNEE ARTHROPLASTY (Left Knee)  Patient Location: PACU  Anesthesia Type:Spinal  Level of Consciousness: awake, alert  and oriented  Airway & Oxygen Therapy: Patient Spontanous Breathing  Post-op Assessment: Report given to RN  Post vital signs: Reviewed and stable  Last Vitals:  Vitals Value Taken Time  BP    Temp    Pulse 55 04/24/2018 12:07 PM  Resp 11 04/24/2018 12:07 PM  SpO2 100 % 04/24/2018 12:07 PM  Vitals shown include unvalidated device data.  Last Pain:  Vitals:   04/24/18 0915  TempSrc:   PainSc: 0-No pain         Complications: No apparent anesthesia complications

## 2018-04-24 NOTE — Anesthesia Procedure Notes (Signed)
Spinal  Patient location during procedure: OR Start time: 04/24/2018 10:15 AM End time: 04/24/2018 10:18 AM Staffing Anesthesiologist: Alvin Critchley, MD Performed: anesthesiologist  Preanesthetic Checklist Completed: patient identified, site marked, surgical consent, pre-op evaluation, timeout performed, IV checked, risks and benefits discussed and monitors and equipment checked Spinal Block Patient position: sitting Prep: Betadine and site prepped and draped Patient monitoring: heart rate, cardiac monitor, continuous pulse ox and blood pressure Approach: midline Location: L3-4 Injection technique: single-shot Needle Needle type: Whitacre  Needle gauge: 25 G Assessment Sensory level: T8 Additional Notes Time out called.  Patient placed in sitting position.  Back prepped and draped in sterile fashion.  A skin wheal was made in the L3-L4 interspace with 1% Lidocaine plain.  A 25G Whitacre needle was used with the return of clear,colorless CSF in all 4 quadrants.  No blood or paresthesias.  Patient tolerated the procedure well.

## 2018-04-24 NOTE — Op Note (Signed)
DATE OF SURGERY:  04/24/2018 TIME: 12:11 PM  PATIENT NAME:  Joanna Hall   AGE: 52 y.o.    PRE-OPERATIVE DIAGNOSIS:  OSTEOARTHRITIS OF LEFT KNEE  POST-OPERATIVE DIAGNOSIS:  Same  PROCEDURE:  Procedure(s): TOTAL KNEE ARTHROPLASTY  SURGEON:  Lovell Sheehan, MD   ASSISTANT:  Carlynn Spry, PA-C  OPERATIVE IMPLANTS:S&N Cruciate Retaining Left Femoral component size  5, S&N Fixed Bearing Tray size 4 left, Patella polyethylene 3-peg oval button size 32 mm, with a 9 mm polyethylene high flex insert.   PREOPERATIVE INDICATIONS:  Joanna Hall is an 52 y.o. female who has a diagnosis of <principal problem not specified> and elected for a left total knee arthroplasty after failing nonoperative treatment, including activity modification, pain medication, physical therapy and injections who has significant impairment of their activities of daily living.  Radiographs have demonstrated tricompartmental osteoarthritis joint space narrowing, osteophytes, subchondral sclerosis and cyst formation.  The risks, benefits, and alternatives were discussed at length including but not limited to the risks of infection, bleeding, nerve or blood vessel injury, knee stiffness, fracture, dislocation, loosening or failure of the hardware and the need for further surgery. Medical risks include but not limited to DVT and pulmonary embolism, myocardial infarction, stroke, pneumonia, respiratory failure and death. I discussed these risks with the patient in my office prior to the date of surgery. They understood these risks and were willing to proceed.  OPERATIVE FINDINGS AND UNIQUE ASPECTS OF THE CASE:  All three compartments with advanced and severe degenerative changes, large osteophytes and an abundance of synovial fluid. Significant deformity was also noted. A decision was made to proceed with total knee arthroplasty.   OPERATIVE DESCRIPTION:  The patient was brought to the operative room and placed in  a supine position after undergoing placement of a general anesthetic. IV antibiotics were given. Patient received tranexamic acid. The lower extremity was prepped and draped in the usual sterile fashion.  A time out was performed to verify the patient's name, date of birth, medical record number, correct site of surgery and correct procedure to be performed. The timeout was also used to confirm the patient received antibiotics and that appropriate instruments, implants and radiographs studies were available in the room.  The leg was elevated and exsanguinated with an Esmarch and the tourniquet was inflated to 250 mmHg.  A midline incision was made over the left knee.. A medial parapatellar arthrotomy was then made and the patella subluxed laterally and the knee was brought into 90 of flexion. Hoffa's fat pad along with the anterior cruciate ligament was resected and the medial joint line was exposed.  Attention was then turned to preparation of the patella. The thickness of the patella was measured with a caliper, the diameter measured with the patella templates.  The patella resection was then made with an oscillating saw using the patella cutting guide.  The 32 mm button fit appropriately.  3 peg holes for the patella component were then drilled.  The extramedullary tibial cutting guide was then placed using the anterior tibial crest and second ray of the foot as a reference.  The tibial cutting guide was adjusted to allow for appropriate posterior slope.  The tibial cutting block was pinned into position. The slotted stylus was used to measure the proximal tibial resection of 2 mm off the low medial side. Care was taken during the tibial resection to protect the medial and collateral ligaments.  The resected tibial bone was removed.  The distal femur  was resected using the TruMatch cutting guide.  Care was taken to protect the collateral ligaments during distal femoral resection.  The distal femoral  resection was performed with an oscillating saw. The femoral cutting guide was then removed. Extension gap was measured with a 9 mm spacer block and alignment and extension was confirmed using a long alignment rod. The femur was sized to be a 5. Rotation of the referencing guide was checked with the epicondylar axis and Whitesides line. Then the 4-in-1 cutting jig was then applied to the distal femur. A stylus was used to confirm that the anterior femur would not be notched.   Then the anterior, posterior and chamfer femoral cuts were then made with an oscillating saw.  All posterior osteophytes were removed.  The flexion gap was then measured with a flexion spacer block and long alignment rod and was found to be symmetric with the extension gap and perpendicular to mechanical axis of the tibia.  The proximal tibia plateau was then sized with trial trays. The best coverage was achieved with a size 4. This tibial tray was then pinned into position. The proximal tibia was then prepared with the reamer and keel punch.  After tibial preparation was completed, all trial components were inserted with polyethylene trials.  The knee was found to have excellent balance and full motion with a size 9 mm tibial polyethylene insert..    The trials were then placed. Knee was taken through a full range of motion and deemed to be stable with the trial components. All trial components were then removed.  The joint was copiously irrigated with pulse lavage.  The final total knee arthroplasty components were then cemented into place. The knee was held in extension while cement was allowed to cure.The knee was taken through a range of motion and the patella tracked well and the knee was again irrigated copiously.  The knee capsule was then injected with Exparel.  The medial arthrotomy was closed with #1 Vicryl and #2 Quill. The subcutaneous tissue closed with  2-0 vicryl, and skin approximated with staples.  A dry sterile and  compressive dressing was applied.  A Polar Care was applied to the operative knee.  The patient was awakened and brought to the PACU in stable and satisfactory condition.  All sharp, lap and instrument counts were correct at the conclusion the case. I spoke with the patient's family in the postop consultation room to let them know the case had been performed without complication and the patient was stable in recovery room.   Total tourniquet time was 53 minutes.

## 2018-04-24 NOTE — NC FL2 (Signed)
[place] LEVEL OF CARE SCREENING TOOL     IDENTIFICATION  Patient Name: Joanna Hall Birthdate: 1966/05/19 Sex: female Admission Date (Current Location): 04/24/2018  [facility] and [place] Number:  Selena Lesser ([id number] [phone]) Facility and Address:  [facility], [address], [place], [place] [address]      Provider Number: [id number]  Attending Physician Name and Address:  Lovell Sheehan, MD  Relative Name and Phone Number:       Current Level of Care: Hospital Recommended Level of Care: [place] Prior Approval Number:    Date Approved/Denied:   PASRR Number: ([id number])  Discharge Plan: SNF    Current Diagnoses: Patient Active Problem List   Diagnosis Date Noted  . S/P TKR (total knee replacement) using cement, left 04/24/2018  . Osteoarthritis of left knee 02/28/2018  . Condyloma acuminatum of anus s/p ablation 02/22/2018 02/22/2018  . Condyloma acuminatum of vagina s/p ablation 02/22/2018 02/22/2018  . Positive ANA (antinuclear antibody) 02/04/2018  . Aortic atherosclerosis ([place]) 01/15/2018  . Elevated MCV 01/15/2018  . Anemia 01/15/2018  . Genital warts 01/15/2018  . Vitamin D deficiency 01/15/2018  . Pernicious anemia   . Rectal bleeding   . Impingement syndrome of shoulder region 12/19/2017  . Multiple joint pain 12/19/2017  . Osteoarthritis of right knee 12/19/2017  . B12 deficiency 12/11/2017  . Lung nodules 12/11/2017  . History of Clostridium difficile infection 10/23/2017  . Factor V Leiden ([place]) 10/19/2017  . Goals of care, counseling/discussion 10/19/2017  . Abnormal cervical Papanicolaou smear 09/18/2017  . Cervical cancer, FIGO stage IB1 ([place]) 09/18/2017  . Chronic hepatitis C without hepatic coma ([place]) 09/18/2017  . Cytopenia 09/18/2017  . Diarrhea 09/18/2017  . Erosive gastropathy 09/18/2017  . Intestinal infection due to Clostridium difficile 09/18/2017  . Lower abdominal pain  09/18/2017  . 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 Malignant neoplasm of overlapping sites of cervix ([place]) 09/18/2017  . Renal insufficiency 09/18/2017  . Wound infection after surgery 09/12/2017  . Hypokalemia   . Hypomagnesemia   . Ileocolic anastomotic leak   . Cervical arthritis 07/18/2017  . Dysuria 06/20/2017  . Metastatic cancer ([place]) 05/12/2017  . Hepatitis, chronic ([place]) 05/05/2017  . Essential hypertension 03/15/2017  . Anemia in other chronic diseases classified elsewhere 03/01/2017  . Chemotherapy-induced neutropenia ([place]) 01/29/2017  . 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 ) Continent Weight: 171 lb (77.6 kg) Height:  5' 8"  (172.7 cm)  BEHAVIORAL SYMPTOMS/MOOD NEUROLOGICAL BOWEL NUTRITION STATUS      Continent Diet(Diet: Clear Liquid to be Advanced. )  AMBULATORY STATUS COMMUNICATION OF NEEDS Skin   Extensive Assist Verbally Surgical wounds(Incision: Left Knee. )                       Personal Care Assistance Level of Assistance  Bathing, Feeding, Dressing Bathing Assistance: Limited assistance Feeding assistance: Independent Dressing Assistance: Limited assistance     Functional Limitations Info  Sight, Hearing, Speech Sight Info: Adequate Hearing Info: Adequate Speech Info: Adequate    SPECIAL CARE FACTORS FREQUENCY  PT (By licensed PT), OT (By licensed OT)     PT Frequency: (5) OT Frequency: (5)            Contractures      Additional Factors Info  Code Status, Allergies Code Status Info: (Full Code. ) Allergies Info: (Ketamine)  Current Medications (04/24/2018):  This is the current hospital active medication list Current Facility-Administered Medications  Medication Dose Route Frequency Provider Last Rate Last Dose  . acetaminophen (TYLENOL) 500 MG tablet           . acetaminophen (TYLENOL) tablet 1,000 mg   1,000 mg Oral Q6H Lovell Sheehan, MD      . Derrill Memo ON 04/25/2018] acetaminophen (TYLENOL) tablet 325-650 mg  325-650 mg Oral Q6H PRN Lovell Sheehan, MD      . acidophilus (RISAQUAD) capsule 1 capsule  1 capsule Oral Daily Lovell Sheehan, MD      . albuterol (PROVENTIL) (2.5 MG/3ML) 0.083% nebulizer solution 3 mL  3 mL Inhalation Q6H PRN Lovell Sheehan, MD      . bisacodyl (DULCOLAX) suppository 10 mg  10 mg Rectal Daily PRN Lovell Sheehan, MD      . ceFAZolin (ANCEF) IVPB 1 g/50 mL premix  1 g Intravenous Q6H Lovell Sheehan, MD      . cholestyramine Lucrezia Starch) packet 4 g  4 g Oral Daily Lovell Sheehan, MD      . dicyclomine (BENTYL) capsule 10 mg  10 mg Oral TID AC & HS Lovell Sheehan, MD      . docusate sodium (COLACE) capsule 100 mg  100 mg Oral BID Lovell Sheehan, MD      . famotidine (PEPCID) 20 MG tablet           . fluticasone (FLONASE) 50 MCG/ACT nasal spray 2 spray  2 spray Each Nare Daily PRN Lovell Sheehan, MD      . gabapentin (NEURONTIN) 300 MG capsule           . gabapentin (NEURONTIN) capsule 300 mg  300 mg Oral TID Lovell Sheehan, MD      . glycopyrrolate (ROBINUL) 0.2 MG/ML injection           . HYDROmorphone (DILAUDID) injection 0.5-1 mg  0.5-1 mg Intravenous Q4H PRN Lovell Sheehan, MD      . ketorolac (TORADOL) 15 MG/ML injection 15 mg  15 mg Intravenous Q6H Lovell Sheehan, MD   15 mg at 04/24/18 1248  . lactated ringers infusion   Intravenous Continuous Lovell Sheehan, MD      . loperamide (IMODIUM) capsule 2 mg  2 mg Oral QID PRN Lovell Sheehan, MD      . loratadine (CLARITIN) tablet 10 mg  10 mg Oral Daily Lovell Sheehan, MD      . magnesium citrate solution 1 Bottle  1 Bottle Oral Once PRN Lovell Sheehan, MD      . menthol-cetylpyridinium (CEPACOL) lozenge 3 mg  1 lozenge Oral PRN Lovell Sheehan, MD       Or  . phenol (CHLORASEPTIC) mouth spray 1 spray  1 spray Mouth/Throat PRN Lovell Sheehan, MD      . metoCLOPramide (REGLAN) tablet 5-10 mg  5-10 mg  Oral Q8H PRN Lovell Sheehan, MD       Or  . metoCLOPramide (REGLAN) injection 5-10 mg  5-10 mg Intravenous Q8H PRN Lovell Sheehan, MD      . multivitamin with minerals tablet 1 tablet  1 tablet Oral Daily Lovell Sheehan, MD      . ondansetron [facility]) tablet 4 mg  4 mg Oral Q6H PRN Lovell Sheehan, MD       Or  . ondansetron [facility]) injection 4 mg  4  mg Intravenous Q6H PRN Lovell Sheehan, MD      . oxyCODONE (Oxy IR/ROXICODONE) immediate release tablet 10-15 mg  10-15 mg Oral Q4H PRN Lovell Sheehan, MD      . oxyCODONE (Oxy IR/ROXICODONE) immediate release tablet 5-10 mg  5-10 mg Oral Q4H PRN Lovell Sheehan, MD      . polyethylene glycol (MIRALAX / GLYCOLAX) packet 17 g  17 g Oral Daily PRN Lovell Sheehan, MD      . Derrill Memo ON 04/25/2018] rivaroxaban (XARELTO) tablet 20 mg  20 mg Oral Daily Lovell Sheehan, MD      . sertraline (ZOLOFT) tablet 50 mg  50 mg Oral Daily Lovell Sheehan, MD      . traMADol Veatrice Bourbon) tablet 50 mg  50 mg Oral Q6H Lovell Sheehan, MD      . Derrill Memo ON 04/30/2018] Vitamin D (Ergocalciferol) (DRISDOL) capsule 50,000 Units  50,000 Units Oral Q Jinger Neighbors, MD         Discharge Medications: Please see discharge summary for a list of discharge medications.  Relevant Imaging Results:  Relevant Lab Results:   Additional Information (SSN: [id number])  Velina Drollinger, Veronia Beets, LCSW

## 2018-04-25 ENCOUNTER — Other Ambulatory Visit: Payer: Self-pay | Admitting: Urgent Care

## 2018-04-25 ENCOUNTER — Encounter: Payer: Self-pay | Admitting: Orthopedic Surgery

## 2018-04-25 DIAGNOSIS — R918 Other nonspecific abnormal finding of lung field: Secondary | ICD-10-CM

## 2018-04-25 LAB — BASIC METABOLIC PANEL WITH GFR
Anion gap: 5 (ref 5–15)
BUN: 14 mg/dL (ref 6–20)
CO2: 25 mmol/L (ref 22–32)
Calcium: 8.3 mg/dL — ABNORMAL LOW (ref 8.9–10.3)
Chloride: 108 mmol/L (ref 98–111)
Creatinine, Ser: 0.68 mg/dL (ref 0.44–1.00)
GFR calc Af Amer: >60 mL/min
GFR calc non Af Amer: >60 mL/min
Glucose, Bld: 89 mg/dL (ref 70–99)
Potassium: 4.1 mmol/L (ref 3.5–5.1)
Sodium: 138 mmol/L (ref 135–145)

## 2018-04-25 LAB — CBC
HEMATOCRIT: 27.9 % — AB (ref 35.0–47.0)
HEMOGLOBIN: 9.7 g/dL — AB (ref 12.0–16.0)
MCH: 35.5 pg — ABNORMAL HIGH (ref 26.0–34.0)
MCHC: 34.9 g/dL (ref 32.0–36.0)
MCV: 101.8 fL — ABNORMAL HIGH (ref 80.0–100.0)
Platelets: 106 10*3/uL — ABNORMAL LOW (ref 150–440)
RBC: 2.74 MIL/uL — AB (ref 3.80–5.20)
RDW: 12.7 % (ref 11.5–14.5)
WBC: 2.8 10*3/uL — ABNORMAL LOW (ref 3.6–11.0)

## 2018-04-25 NOTE — Progress Notes (Signed)
Pt complaining of pain 10/10. MD [name] notified, per MD ok to give PRN Dilaudid. MD also notified that surgical dressing has some blood drainage.

## 2018-04-25 NOTE — Anesthesia Postprocedure Evaluation (Deleted)
Anesthesia Post Note  Patient: Joanna Hall  Procedure(s) Performed: TOTAL KNEE ARTHROPLASTY (Left Knee)  Patient location during evaluation: PACU Anesthesia Type: General Level of consciousness: awake and alert and oriented Pain management: pain level controlled Vital Signs Assessment: post-procedure vital signs reviewed and stable Respiratory status: spontaneous breathing Cardiovascular status: blood pressure returned to baseline Anesthetic complications: no     Last Vitals:  Vitals:   04/25/18 0839 04/25/18 0946  BP: 127/83   Pulse: 72 77  Resp:    Temp: 36.4 C   SpO2: 100% 100%    Last Pain:  Vitals:   04/25/18 0915  TempSrc:   PainSc: 9                  Lauria Depoy

## 2018-04-25 NOTE — Progress Notes (Signed)
Physical Therapy Treatment Patient Details Name: Joanna Hall MRN: [id number] DOB: 1966-09-20 Today's Date: 04/25/2018    History of Present Illness [name] is a 52 year old female with a history hypertension, aortic atherosclerosis, and factor V leiden who underwent left knee arthroplasty with Dr. Harlow Hall on 04/24/18. Post operatively, patient was bradycardic and complaining of left-sided chest cramping. Today, patient states that the chest cramping has resolved and she hasn't needed to use ice to help with the symptoms. She complains of left knee pain, but denies fatigue, lightheadedness, dizziness, shortness of breath, palpitations, or lower extremity swelling. She also denies abdominal pain, N/V/D.     PT Comments    Pt responded well to therapy, showing increased ambulation capacity since today's earlier session.  Pt was nauseous at PT arrival, but nausea subsided within a few minutes. Pt  did not become light headed, dizzy, or nauseous during treatment. Pt was able to sit to stand with increased ease, still requiring mod assist and assistive device. Pt was able to ambulate 50 feet before being limited by pain. Pt showed increased ant/post sway during standing and ambulation. Pt shows difficulty bearing weight through LLE, and is not able to initiate step through pattern with ambulation. Pt exhibits impulsivity throughout session and is easily distracted by enviroment.Pt will continue to benefit from skilled PT to address above deficits and promote optimal return to PLOF.   Follow Up Recommendations  Home health PT     Equipment Recommendations  Rolling walker with 5" wheels    Recommendations for Other Services       Precautions / Restrictions Precautions Precautions: Fall Restrictions Weight Bearing Restrictions: Yes    Mobility  Bed Mobility Overal bed mobility: Independent             General bed mobility comments: Pt demonstrated ability to bridge, slide,  transfer supine to sit independently.   Transfers Overall transfer level: Needs assistance Equipment used: Rolling walker (2 wheeled) Transfers: Sit to/from Stand Sit to Stand: Mod assist         General transfer comment: Pt able to trabsfer supine to sit independently however requires +1 mod assist for sit to stand and stand to sit with assistive device.   Ambulation/Gait Ambulation/Gait assistance: Mod assist Gait Distance (Feet): 50 Feet Assistive device: Rolling walker (2 wheeled) Gait Pattern/deviations: Step-to pattern;Decreased step length - right;Decreased stride length;Trunk flexed     General Gait Details: Pain present with walking ( NPS=9/10), decreased gait speed leading to increased stance phase bilat.    Stairs             Wheelchair Mobility    Modified Rankin (Stroke Patients Only)       Balance Overall balance assessment: Needs assistance Sitting-balance support: No upper extremity supported;Feet supported Sitting balance-Leahy Scale: Normal     Standing balance support: Bilateral upper extremity supported;During functional activity Standing balance-Leahy Scale: Fair Standing balance comment: Pt requires walker and mod assist from therapist for optimal standing balance. Pt requires some physical feedback with gait in order to maintain balance.                             Cognition  Exercises Other Exercises Other Exercises: Bed mobility: Supine to sit ind; Transfers: Sit to stand mod assist; Gait training with 2 wheel RW and mod assist    General Comments        Pertinent Vitals/Pain Pain Assessment: 0-10 Pain Score: 9  Pain Location: L knee Pain Intervention(s): Limited activity within patient's tolerance;Repositioned;Ice applied;Monitored during session;Premedicated before session    Home Living                      Prior Function             PT Goals (current goals can now be found in the care plan section) Progress towards PT goals: Progressing toward goals(Pt is moving well, and as pain levels susbside should be able to continue to tolerate more actvity. )    Frequency    BID      PT Plan Current plan remains appropriate    Co-evaluation              AM-PAC PT "6 Clicks" Daily Activity  Outcome Measure  Difficulty turning over in bed (including adjusting bedclothes, sheets and blankets)?: A Little Difficulty moving from lying on back to sitting on the side of the bed? : A Little Difficulty sitting down on and standing up from a chair with arms (e.g., wheelchair, bedside commode, etc,.)?: Unable Help needed moving to and from a bed to chair (including a wheelchair)?: A Little Help needed walking in hospital room?: A Little Help needed climbing 3-5 steps with a railing? : A Lot 6 Click Score: 15    End of Session Equipment Utilized During Treatment: Gait belt Activity Tolerance: Patient limited by pain(Pt showed good progression of activity, but was limited in walking distance by pain levels. ) Patient left: in bed;with bed alarm set;with call bell/phone within reach;with family/visitor present   PT Visit Diagnosis: Unsteadiness on feet (R26.81);Other abnormalities of gait and mobility (R26.89)     Time: 1400-1435 PT Time Calculation (min) (ACUTE ONLY): 35 min  Charges:  $Gait Training: 8-22 mins $Therapeutic Activity: 8-22 mins                    G Codes:       Hortencia Conradi, SPT 05/02/18,4:18 PM

## 2018-04-25 NOTE — Evaluation (Addendum)
Physical Therapy Evaluation Patient Details Name: Joanna Hall MRN: [id number] DOB: [birth year] Today's Date: 04/25/2018   History of Present Illness  Ms. Joanna Hall is a 52 year old female with a history hypertension, aortic atherosclerosis, and factor V leiden who underwent left knee arthroplasty with Dr. Harlow Hall on 04/24/18. Post operatively, patient was bradycardic and complaining of left-sided chest cramping. Today, patient states that the chest cramping has resolved and she hasn't needed to use ice to help with the symptoms. She complains of left knee pain, but denies fatigue, lightheadedness, dizziness, shortness of breath, palpitations, or lower extremity swelling. She also denies abdominal pain, N/V/D.   Clinical Impression  Pt shows good post op indicators. Pt had normal sensation on LLE, and no abnormal swelling. Pt was able to perform all assigned therapeutic exercises, however with reported high levels of pain (8-9/10). Pt was able to transfer supine to sit independently and sit to stand with mod assist. Pt was able to walk 30 ft mod assist with maintained pain levels and antalgic gait. Pt reported Light headedness/ Dizziness/Nausea with ambulation and will be monitored for orthostatics moving forward. Pt 02 levels remained >99 throughout session on room air. Would benefit from skilled PT to address above deficits and promote optimal return to PLOF. Recommend transition to [place] upon discharge from acute hospitalization, however pt may progress to outpatient based off of future therapy session performance.     Follow Up Recommendations Home health PT(Pt may progress to outpatient status depending on further sessions, will follow up at next session)    Equipment Recommendations  Rolling walker with 5" wheels    Recommendations for Other Services       Precautions / Restrictions Restrictions Weight Bearing Restrictions: Yes LLE Weight Bearing: Weight bearing as tolerated       Mobility  Bed Mobility Overal bed mobility: Independent             General bed mobility comments: Pt demonstrated ability to bridge, slide, transfer supine to sit independently.   Transfers Overall transfer level: Needs assistance Equipment used: Rolling walker (2 wheeled) Transfers: Sit to/from Stand Sit to Stand: Mod assist         General transfer comment: Pt able to trabsfer supine to sit independently however requires +1 mod assist for sit to stand and stand to sit with assistive device.   Ambulation/Gait Ambulation/Gait assistance: Mod assist Gait Distance (Feet): 30 Feet Assistive device: Rolling walker (2 wheeled) Gait Pattern/deviations: Step-to pattern;Decreased step length - right;Decreased stride length;Trunk flexed     General Gait Details: Pain present with walking ( NPS=9/10), decreased gait speed leading to increased stance phase bilat.   Stairs            Wheelchair Mobility    Modified Rankin (Stroke Patients Only)       Balance Overall balance assessment: Needs assistance Sitting-balance support: No upper extremity supported;Feet supported Sitting balance-Leahy Scale: Normal     Standing balance support: Bilateral upper extremity supported;During functional activity Standing balance-Leahy Scale: Fair Standing balance comment: Pt requires walker and mod assist from therapist for optimal standing balance. Pt requires some physical feedback with gait in order to maintain balance.                              Pertinent Vitals/Pain Pain Assessment: 0-10 Pain Score: 8  Pain Location: L knee Pain Intervention(s): Monitored during session;RN gave pain meds during session;Utilized relaxation techniques;Limited activity  within patient's tolerance    Home Living Family/patient expects to be discharged to:: Private residence Living Arrangements: Parent Available Help at Discharge: Family Type of Home: House Home Access: Stairs  to enter   Technical brewer of Steps: 1 step  Home Layout: Two level(Pt reports having bed and bathroom on first floor, and denies need to access 2nd level)   Additional Comments: Will clarify bathroom accessibility further    Prior Function Level of Independence: Independent               Hand Dominance        Extremity/Trunk Assessment        Lower Extremity Assessment Lower Extremity Assessment: (L knee, hip and ankle able to actively extend and flex. )       Communication   Communication: No difficulties  Cognition Arousal/Alertness: Awake/alert Behavior During Therapy: WFL for tasks assessed/performed Overall Cognitive Status: Within Functional Limits for tasks assessed                                        General Comments      Exercises Total Joint Exercises Ankle Circles/Pumps: AROM;Left;10 reps Quad Sets: AROM;10 reps;Left Straight Leg Raises: AROM;10 reps;Left Goniometric ROM: L knee: 10-102 deg Other Exercises Other Exercises: Bed mobility: Supine to sit ind; Transfers: Sit to stand mod assist; Gait training with 2 wheel RW and mod assist   Assessment/Plan    PT Assessment Patient needs continued PT services  PT Problem List Decreased mobility;Decreased strength;Decreased range of motion;Decreased activity tolerance       PT Treatment Interventions Gait training;Stair training;Therapeutic exercise;Therapeutic activities;Patient/family education;Balance training;Neuromuscular re-education;Functional mobility training    PT Goals (Current goals can be found in the Care Plan section)       Frequency BID   Barriers to discharge        Co-evaluation               AM-PAC PT "6 Clicks" Daily Activity  Outcome Measure Difficulty turning over in bed (including adjusting bedclothes, sheets and blankets)?: A Little Difficulty moving from lying on back to sitting on the side of the bed? : A Little Difficulty sitting  down on and standing up from a chair with arms (e.g., wheelchair, bedside commode, etc,.)?: Unable Help needed moving to and from a bed to chair (including a wheelchair)?: A Little Help needed walking in hospital room?: A Little Help needed climbing 3-5 steps with a railing? : A Lot 6 Click Score: 15    End of Session Equipment Utilized During Treatment: Gait belt Activity Tolerance: Other (comment)(Pt pain levels maintianed NPS 8-9/10 thorughout session. Pt reported LH/DZ/[place] with walking but returned to baseline upon sitting. ) Patient left: in chair;with chair alarm set;with call bell/phone within reach;with family/visitor present   PT Visit Diagnosis: Unsteadiness on feet (R26.81);Other abnormalities of gait and mobility (R26.89)    Time: 0786-7544 PT Time Calculation (min) (ACUTE ONLY): 39 min   Charges:         PT G Codes:        Hortencia Conradi, SPT 04/25/18,10:44 AM

## 2018-04-25 NOTE — Anesthesia Postprocedure Evaluation (Signed)
Anesthesia Post Note  Patient: Joanna Hall  Procedure(s) Performed: TOTAL KNEE ARTHROPLASTY (Left Knee)  Patient location during evaluation: PACU Anesthesia Type: Spinal Level of consciousness: awake and alert and oriented Pain management: pain level controlled Vital Signs Assessment: post-procedure vital signs reviewed and stable Respiratory status: spontaneous breathing Cardiovascular status: blood pressure returned to baseline Anesthetic complications: no Comments: Atypical chest pain in PACU, cleared and evaluated by cardiology with no further workup needed     Last Vitals:  Vitals:   04/25/18 0839 04/25/18 0946  BP: 127/83   Pulse: 72 77  Resp:    Temp: 36.4 C   SpO2: 100% 100%    Last Pain:  Vitals:   04/25/18 0915  TempSrc:   PainSc: 9                  Jaylina Ramdass

## 2018-04-25 NOTE — Progress Notes (Signed)
[facility] Cardiology  SUBJECTIVE: Joanna Hall is a 52 year old female with a history hypertension, aortic atherosclerosis, and factor V leiden who underwent left knee arthroplasty with Dr. [name] on 04/24/18. Post operatively, patient was bradycardic and complaining of left-sided chest cramping. Today, patient states that the chest cramping has resolved and she hasn't needed to use ice to help with the symptoms. She complains of left knee pain, but denies fatigue, lightheadedness, dizziness, shortness of breath, palpitations, or lower extremity swelling. She also denies abdominal pain, N/V/D.    Vitals:   04/24/18 1842 04/24/18 1936 04/25/18 0037 04/25/18 0312  BP: 131/84 119/77 114/71 107/64  Pulse: 65 (!) 58 71 65  Resp: 18 16 17 16   Temp: 97.8 F (36.6 C) (!) 97.5 F (36.4 C) 97.9 F (36.6 C) 97.7 F (36.5 C)  TempSrc: Oral Oral Oral Oral  SpO2: 100% 100% 100% 99%  Weight:      Height:         Intake/Output Summary (Last 24 hours) at 04/25/2018 0759 Last data filed at 04/24/2018 1900 Gross per 24 hour  Intake 775 ml  Output 950 ml  Net -175 ml      PHYSICAL EXAM  General: Well developed, well nourished, in no acute distress HEENT:  Normocephalic and atramatic Neck:  No JVD.  Lungs: Clear bilaterally to auscultation and percussion. Heart: HRRR . Normal S1 and S2 without gallops or murmurs.  Abdomen: Bowel sounds are positive, abdomen soft and non-tender  Msk:  Back normal. Normal strength and tone for age. Extremities: No clubbing, cyanosis or edema.   Neuro: Alert and oriented X 3. Psych:  Good affect, responds appropriately   LABS: Basic Metabolic Panel: Recent Labs    04/25/18 0419  NA 138  K 4.1  CL 108  CO2 25  GLUCOSE 89  BUN 14  CREATININE 0.68  CALCIUM 8.3*   Liver Function Tests: No results for input(s): AST, ALT, ALKPHOS, BILITOT, PROT, ALBUMIN in the last 72 hours. No results for input(s): LIPASE, AMYLASE in the last 72 hours. CBC: Recent Labs     04/25/18 0419  WBC 2.8*  HGB 9.7*  HCT 27.9*  MCV 101.8*  PLT 106*   Cardiac Enzymes: No results for input(s): CKTOTAL, CKMB, CKMBINDEX, TROPONINI in the last 72 hours. BNP: Invalid input(s): POCBNP D-Dimer: No results for input(s): DDIMER in the last 72 hours. Hemoglobin A1C: No results for input(s): HGBA1C in the last 72 hours. Fasting Lipid Panel: No results for input(s): CHOL, HDL, LDLCALC, TRIG, CHOLHDL, LDLDIRECT in the last 72 hours. Thyroid Function Tests: No results for input(s): TSH, T4TOTAL, T3FREE, THYROIDAB in the last 72 hours.  Invalid input(s): FREET3 Anemia Panel: No results for input(s): VITAMINB12, FOLATE, FERRITIN, TIBC, IRON, RETICCTPCT in the last 72 hours.  Nm Pet Image Initial (pi) Skull Base To Thigh  Result Date: 04/23/2018 CLINICAL DATA:  Subsequent treatment strategy for cervical cancer. Multiple pulmonary nodules. EXAM: NUCLEAR MEDICINE PET SKULL BASE TO THIGH TECHNIQUE: 8.3 mCi F-18 FDG was injected intravenously. Full-ring PET imaging was performed from the skull base to thigh after the radiotracer. CT data was obtained and used for attenuation correction and anatomic localization. Fasting blood glucose: 80 mg/dl COMPARISON:  Multiple exams, including CT scan of 04/15/2018 and outside PET-CT from [place] [facility] [place] dated 11/08/2017 FINDINGS: Mediastinal blood pool activity: SUV max 2.5 NECK: No significant abnormal hypermetabolic activity in this region. Incidental CT findings: Mild chronic right maxillary sinusitis. CHEST: Roughly similar appearance of innumerable  solid and cavitary nodules scattered in both lungs. When I compare back to the prior PET-CT from 11/08/2017 these nodules have clearly increased in size and have also had some increase in activity, although hypermetabolic activity remains relatively low-grade. For example, an index nodule in the left upper lobe on image 83/4 appears solid and measures 1.1 by 0.9 cm, previously 0.6  by 0.7 cm, with maximum standard uptake value 1.9, formerly 0.9. A 7 mm nodule in the right lower lobe has a maximum SUV of 2.3. Other nodules likewise have low-grade metabolic activity. Most of the nodules or at or below 1 cm in diameter. No enlarged or hypermetabolic lymph nodes in the chest. Incidental CT findings: Aortic arch atherosclerotic vascular disease. ABDOMEN/PELVIS: Physiologic accentuation of activity in segments of bowel. This primarily involves the colon. No appreciable uterine hypermetabolic activity. Upper normal sized porta hepatis lymph nodes are not hypermetabolic. No pathologic retroperitoneal or pelvic adenopathy. Incidental CT findings: Cholecystectomy. Aortoiliac atherosclerotic vascular disease. Postoperative findings in the right colon. Presacral stranding, likely due to prior radiation therapy. SKELETON: No significant abnormal hypermetabolic activity in this region. Incidental CT findings: Dental cavities noted. IMPRESSION: 1. The numerous scattered solid and cavitary nodules in the lungs are essentially stable from 04/15/2018 but have increased in size compared to the prior PET-CT from 11/08/2017. These demonstrate low-grade metabolic activity up to a maximum SUV of about 2.3, which is also increased from 11/08/2017. Appearance favors cavitary malignancy over infectious processes such as fungal disease. 2. Post therapy related findings in the pelvis. 3. Other imaging findings of potential clinical significance: Mild chronic right maxillary sinusitis. Aortic Atherosclerosis (ICD10-I70.0). Dental cavities. Electronically Signed   By: Van Clines M.D.   On: 04/23/2018 11:55   Dg Knee Left Port  Result Date: 04/24/2018 CLINICAL DATA:  Status post left total knee joint prosthesis placement. EXAM: PORTABLE LEFT KNEE - 1-2 VIEW COMPARISON:  MRI of the left knee of Feb 26, 2018 FINDINGS: The patient has undergone total knee joint prosthesis placement. Radiographic positioning of the  prosthetic components is good. The interface with the native bone appears normal. There is no acute native bone fracture. Surgical skin staples are present. IMPRESSION: No immediate postprocedure complication following left total knee joint prosthesis placement. Electronically Signed   By: David  Martinique M.D.   On: 04/24/2018 12:54     Echo: Last echo in 08/2017 showed normal EF of 60-65% with mild LVH.   ASSESSMENT AND PLAN:  Active Problems:   S/P TKR (total knee replacement) using cement, left    1. Bradycardia   - Patient asymptomatic,last rate at 65, continue to monitor  - Follow up outpatient cardiology, Holter monitor to further evaluate  2. Atypical chest pain   - Patient asymptomatic, unlikely to be ischemic in nature   - Follow up outpatient cardiology    The history, physical exam findings, and plan of care were discussed with Dr. Bartholome Bill and all decision making was made in collaboration    Avie Arenas  PA-C 04/25/2018 7:59 AM

## 2018-04-25 NOTE — Progress Notes (Signed)
Subjective:  Patient reports pain as moderate.    Objective:   VITALS:   Vitals:   04/24/18 1936 04/25/18 0037 04/25/18 0312 04/25/18 0839  BP: 119/77 114/71 107/64 127/83  Pulse: (!) 58 71 65 72  Resp: 16 17 16    Temp: (!) 97.5 F (36.4 C) 97.9 F (36.6 C) 97.7 F (36.5 C) 97.6 F (36.4 C)  TempSrc: Oral Oral Oral Oral  SpO2: 100% 100% 99% 100%  Weight:      Height:        PHYSICAL EXAM:  Sensation intact distally Intact pulses distally Dorsiflexion/Plantar flexion intact Incision: dressing C/D/I Compartment soft  LABS  Results for orders placed or performed during the hospital encounter of 04/24/18 (from the past 24 hour(s))  CBC     Status: Abnormal   Collection Time: 04/25/18  4:19 AM  Result Value Ref Range   WBC 2.8 (L) 3.6 - 11.0 K/uL   RBC 2.74 (L) 3.80 - 5.20 MIL/uL   Hemoglobin 9.7 (L) 12.0 - 16.0 g/dL   HCT 27.9 (L) 35.0 - 47.0 %   MCV 101.8 (H) 80.0 - 100.0 fL   MCH 35.5 (H) 26.0 - 34.0 pg   MCHC 34.9 32.0 - 36.0 g/dL   RDW 12.7 11.5 - 14.5 %   Platelets 106 (L) 150 - 440 K/uL  Basic metabolic panel     Status: Abnormal   Collection Time: 04/25/18  4:19 AM  Result Value Ref Range   Sodium 138 135 - 145 mmol/L   Potassium 4.1 3.5 - 5.1 mmol/L   Chloride 108 98 - 111 mmol/L   CO2 25 22 - 32 mmol/L   Glucose, Bld 89 70 - 99 mg/dL   BUN 14 6 - 20 mg/dL   Creatinine, Ser 0.68 0.44 - 1.00 mg/dL   Calcium 8.3 (L) 8.9 - 10.3 mg/dL   GFR calc non Af Amer >60 >60 mL/min   GFR calc Af Amer >60 >60 mL/min   Anion gap 5 5 - 15    Nm Pet Image Initial (pi) Skull Base To Thigh  Result Date: 04/23/2018 CLINICAL DATA:  Subsequent treatment strategy for cervical cancer. Multiple pulmonary nodules. EXAM: NUCLEAR MEDICINE PET SKULL BASE TO THIGH TECHNIQUE: 8.3 mCi F-18 FDG was injected intravenously. Full-ring PET imaging was performed from the skull base to thigh after the radiotracer. CT data was obtained and used for attenuation correction and anatomic  localization. Fasting blood glucose: 80 mg/dl COMPARISON:  Multiple exams, including CT scan of 04/15/2018 and outside PET-CT from [place] [facility] [place] dated 11/08/2017 FINDINGS: Mediastinal blood pool activity: SUV max 2.5 NECK: No significant abnormal hypermetabolic activity in this region. Incidental CT findings: Mild chronic right maxillary sinusitis. CHEST: Roughly similar appearance of innumerable solid and cavitary nodules scattered in both lungs. When I compare back to the prior PET-CT from 11/08/2017 these nodules have clearly increased in size and have also had some increase in activity, although hypermetabolic activity remains relatively low-grade. For example, an index nodule in the left upper lobe on image 83/4 appears solid and measures 1.1 by 0.9 cm, previously 0.6 by 0.7 cm, with maximum standard uptake value 1.9, formerly 0.9. A 7 mm nodule in the right lower lobe has a maximum SUV of 2.3. Other nodules likewise have low-grade metabolic activity. Most of the nodules or at or below 1 cm in diameter. No enlarged or hypermetabolic lymph nodes in the chest. Incidental CT findings: Aortic arch atherosclerotic vascular disease. ABDOMEN/PELVIS: Physiologic accentuation  of activity in segments of bowel. This primarily involves the colon. No appreciable uterine hypermetabolic activity. Upper normal sized porta hepatis lymph nodes are not hypermetabolic. No pathologic retroperitoneal or pelvic adenopathy. Incidental CT findings: Cholecystectomy. Aortoiliac atherosclerotic vascular disease. Postoperative findings in the right colon. Presacral stranding, likely due to prior radiation therapy. SKELETON: No significant abnormal hypermetabolic activity in this region. Incidental CT findings: Dental cavities noted. IMPRESSION: 1. The numerous scattered solid and cavitary nodules in the lungs are essentially stable from 04/15/2018 but have increased in size compared to the prior PET-CT from  11/08/2017. These demonstrate low-grade metabolic activity up to a maximum SUV of about 2.3, which is also increased from 11/08/2017. Appearance favors cavitary malignancy over infectious processes such as fungal disease. 2. Post therapy related findings in the pelvis. 3. Other imaging findings of potential clinical significance: Mild chronic right maxillary sinusitis. Aortic Atherosclerosis (ICD10-I70.0). Dental cavities. Electronically Signed   By: Van Clines M.D.   On: 04/23/2018 11:55   Dg Knee Left Port  Result Date: 04/24/2018 CLINICAL DATA:  Status post left total knee joint prosthesis placement. EXAM: PORTABLE LEFT KNEE - 1-2 VIEW COMPARISON:  MRI of the left knee of Feb 26, 2018 FINDINGS: The patient has undergone total knee joint prosthesis placement. Radiographic positioning of the prosthetic components is good. The interface with the native bone appears normal. There is no acute native bone fracture. Surgical skin staples are present. IMPRESSION: No immediate postprocedure complication following left total knee joint prosthesis placement. Electronically Signed   By: David  Martinique M.D.   On: 04/24/2018 12:54    Assessment/Plan: 1 Day Post-Op   Active Problems:   S/P TKR (total knee replacement) using cement, left   Advance diet Up with therapy D/C IV fluids Plan for discharge tomorrow if clears PT Start Xarelto today   Lovell Sheehan , MD 04/25/2018, 8:43 AM

## 2018-04-25 NOTE — Progress Notes (Signed)
Clinical Social Worker ([name]) received SNF consult. PT is recommending home health. RN case manager aware of above. Please reconsult if future social work needs arise. [name] signing off.   Ekansh Sherk, LCSW [phone] 

## 2018-04-26 ENCOUNTER — Telehealth: Payer: Self-pay | Admitting: *Deleted

## 2018-04-26 LAB — CBC
HCT: 30.5 % — ABNORMAL LOW (ref 35.0–47.0)
HEMOGLOBIN: 10.7 g/dL — AB (ref 12.0–16.0)
MCH: 35.6 pg — AB (ref 26.0–34.0)
MCHC: 35 g/dL (ref 32.0–36.0)
MCV: 102 fL — AB (ref 80.0–100.0)
PLATELETS: 137 10*3/uL — AB (ref 150–440)
RBC: 2.99 MIL/uL — AB (ref 3.80–5.20)
RDW: 12.9 % (ref 11.5–14.5)
WBC: 4 10*3/uL (ref 3.6–11.0)

## 2018-04-26 NOTE — Telephone Encounter (Signed)
Called patient to inform her that her lung biopsy has been scheduled for 7-22.  She should arrive @ 9 am for 10 AM procedure.  Patient to hold Xarelto on Sunday. (Last dose on Saturday). NPO after midnight on Sunday.  May take morning meds with small sips of water.  Patent verbalized understanding.

## 2018-04-26 NOTE — Progress Notes (Signed)
  Subjective:  Patient reports pain as moderate.  Severe pain overnight.  Objective:   VITALS:   Vitals:   04/25/18 1529 04/25/18 1649 04/26/18 0002 04/26/18 0732  BP:  (!) 144/81 (!) 144/93 (!) 162/93  Pulse: 80 65 85 85  Resp:   18 18  Temp:  97.8 F (36.6 C) 98.7 F (37.1 C) 98.3 F (36.8 C)  TempSrc:  Oral Oral Oral  SpO2: 99% 100% 98% 94%  Weight:      Height:        PHYSICAL EXAM:  Neurovascular intact Intact pulses distally Dorsiflexion/Plantar flexion intact Incision: moderate drainage Compartment soft  LABS  Results for orders placed or performed during the hospital encounter of 04/24/18 (from the past 24 hour(s))  CBC     Status: Abnormal   Collection Time: 04/26/18  3:32 AM  Result Value Ref Range   WBC 4.0 3.6 - 11.0 K/uL   RBC 2.99 (L) 3.80 - 5.20 MIL/uL   Hemoglobin 10.7 (L) 12.0 - 16.0 g/dL   HCT 30.5 (L) 35.0 - 47.0 %   MCV 102.0 (H) 80.0 - 100.0 fL   MCH 35.6 (H) 26.0 - 34.0 pg   MCHC 35.0 32.0 - 36.0 g/dL   RDW 12.9 11.5 - 14.5 %   Platelets 137 (L) 150 - 440 K/uL    Dg Knee Left Port  Result Date: 04/24/2018 CLINICAL DATA:  Status post left total knee joint prosthesis placement. EXAM: PORTABLE LEFT KNEE - 1-2 VIEW COMPARISON:  MRI of the left knee of Feb 26, 2018 FINDINGS: The patient has undergone total knee joint prosthesis placement. Radiographic positioning of the prosthetic components is good. The interface with the native bone appears normal. There is no acute native bone fracture. Surgical skin staples are present. IMPRESSION: No immediate postprocedure complication following left total knee joint prosthesis placement. Electronically Signed   By: David  Martinique M.D.   On: 04/24/2018 12:54    Assessment/Plan: 2 Days Post-Op   Active Problems:   S/P TKR (total knee replacement) using cement, left   Up with therapy Discharge home with home health likely today Dressing changed   Lovell Sheehan , MD 04/26/2018, 9:02 AM

## 2018-04-26 NOTE — Progress Notes (Signed)
Physical Therapy Treatment Patient Details Name: Joanna Hall MRN: [id number] DOB: 03/01/1966 Today's Date: 04/26/2018    History of Present Illness Joanna Hall is a 52 year old female with a history hypertension, aortic atherosclerosis, and factor V leiden who underwent left knee arthroplasty with Dr. [name] on 04/24/18. Post operatively, patient was bradycardic and complaining of left-sided chest cramping. Today, patient states that the chest cramping has resolved and she hasn't needed to use ice to help with the symptoms. She complains of left knee pain, but denies fatigue, lightheadedness, dizziness, shortness of breath, palpitations, or lower extremity swelling. She also denies abdominal pain, N/V/D.     PT Comments    Pt is progressing well, showing improvement in ambulation tolerance and stair navigation. Pt expressed less increase in pain with movement and rehab exercises, although NPS remained 7/10 throughout session. Pt's pain levels make patient express pain behaviors occasionally, that require therapist assistance to maintain safety of patient in environment. PT will continue to educate pt on relaxation techniques to help with pain management and tolerance. Pt will continue to benefit from skilled therapeutic intervention at this time, and overall is progressing towards goals appropriately. Recommend transition to [facility] upon discharge from acute hospitalization.    Follow Up Recommendations  Home health PT     Equipment Recommendations  Rolling walker with 5" wheels    Recommendations for Other Services       Precautions / Restrictions Precautions Precautions: Fall Precaution Booklet Issued: No Restrictions Weight Bearing Restrictions: Yes LLE Weight Bearing: Weight bearing as tolerated    Mobility  Bed Mobility Overal bed mobility: Independent             General bed mobility comments: Pt demonstrated ability to bridge, slide, transfer supine to sit  independently.   Transfers Overall transfer level: Needs assistance Equipment used: Rolling walker (2 wheeled) Transfers: Sit to/from Stand Sit to Stand: Min assist         General transfer comment: Pt requires less assistance than previous session, however did show some unsteadiness while getting up.   Ambulation/Gait Ambulation/Gait assistance: Min assist(Pt would be CGA but during periods of intense pain would require some assistance to maintain optimal standing safety) Gait Distance (Feet): 125 Feet Assistive device: Rolling walker (2 wheeled) Gait Pattern/deviations: Step-to pattern;Decreased step length - right;Decreased stride length;Trunk flexed;Decreased weight shift to left;Drifts right/left Gait velocity: decreased   General Gait Details: Pt overstrides with LLE leading to difficulty initiaitng step through gait pattern with RLE. Pt's pain levels lead to overreliance on UE suppport through walker, increasing difficulty in keeping walker moving fluid and consistently with gait.    Stairs Stairs: Yes Stairs assistance: Mod assist Stair Management: With walker;No rails;Step to pattern;Forwards Number of Stairs: 1 General stair comments: Pt showed safe and effective stair ambulation without high levels of pain present earlier today.    Wheelchair Mobility    Modified Rankin (Stroke Patients Only)       Balance Overall balance assessment: Needs assistance Sitting-balance support: No upper extremity supported;Feet supported Sitting balance-Leahy Scale: Good     Standing balance support: Bilateral upper extremity supported;During functional activity Standing balance-Leahy Scale: Fair Standing balance comment: Pt requires walker and min assist from therapist for optimal gait balance, especially when pain levles lead to patient change in body language.                             Cognition Arousal/Alertness: Awake/alert  Behavior During Therapy: WFL for  tasks assessed/performed Overall Cognitive Status: Within Functional Limits for tasks assessed                                        Exercises Total Joint Exercises Ankle Circles/Pumps: AROM;Left;10 reps Quad Sets: Left;AROM;10 reps Short Arc Quad: AROM;Left;10 reps;AAROM Heel Slides: AROM;10 reps;Left;AAROM Straight Leg Raises: AAROM;10 reps;Left Goniometric ROM: 8-96 deg Other Exercises Other Exercises: Bed mobility: Sit to supinet min assist; Transfers: Sit to stand min assist; Gait training with 2 wheel RW and min assist, stair training ( 1 step) with RW and min assist.    General Comments        Pertinent Vitals/Pain Pain Assessment: 0-10 Pain Score: 7  Pain Location: L knee Pain Descriptors / Indicators: Sharp;Aching;Constant Pain Intervention(s): Monitored during session;Utilized relaxation techniques;Ice applied;Repositioned    Home Living Family/patient expects to be discharged to:: Private residence Living Arrangements: Parent Available Help at Discharge: Family Type of Home: House Home Access: Stairs to enter   Home Layout: Two level        Prior Function Level of Independence: Independent          PT Goals (current goals can now be found in the care plan section) Progress towards PT goals: Progressing toward goals    Frequency    BID      PT Plan Current plan remains appropriate    Co-evaluation              AM-PAC PT "6 Clicks" Daily Activity  Outcome Measure  Difficulty turning over in bed (including adjusting bedclothes, sheets and blankets)?: A Little Difficulty moving from lying on back to sitting on the side of the bed? : A Little Difficulty sitting down on and standing up from a chair with arms (e.g., wheelchair, bedside commode, etc,.)?: A Lot Help needed moving to and from a bed to chair (including a wheelchair)?: A Little Help needed walking in hospital room?: A Little Help needed climbing 3-5 steps with a  railing? : A Lot 6 Click Score: 16    End of Session Equipment Utilized During Treatment: Gait belt Activity Tolerance: Patient limited by pain Patient left: with bed alarm set;with call bell/phone within reach;in bed;with SCD's reapplied Nurse Communication: Other (comment) PT Visit Diagnosis: Unsteadiness on feet (R26.81);Other abnormalities of gait and mobility (R26.89)     Time: 1340-1415 PT Time Calculation (min) (ACUTE ONLY): 35 min  Charges:  $Gait Training: 23-37 mins $Therapeutic Exercise: 8-22 mins                    G Codes:      Hortencia Conradi, SPT 2018-05-02,3:11 PM

## 2018-04-26 NOTE — Progress Notes (Signed)
[facility]  SUBJECTIVE: Ms. Joanna Hall is a 52 year old female with a history hypertension, aortic atherosclerosis, and factor V leiden who underwent left knee arthroplasty with Dr. [name] on 04/24/18. Post operatively, patient was bradycardic and complaining of left-sided chest cramping. Today, patient admits to one episode of chest cramping that occurred last night, but quickly resolved. She has had no recurrent symptoms since. She admits of severe left knee pain, but denies fatigue, lightheadedness, dizziness, shortness of breath, palpitations, or lower extremity swelling. She had some nausea last night, but denies abdominal pain, V/D.      Vitals:   04/25/18 1529 04/25/18 1649 04/26/18 0002 04/26/18 0732  BP:  (!) 144/81 (!) 144/93 (!) 162/93  Pulse: 80 65 85 85  Resp:   18 18  Temp:  97.8 F (36.6 C) 98.7 F (37.1 C) 98.3 F (36.8 C)  TempSrc:  Oral Oral Oral  SpO2: 99% 100% 98% 94%  Weight:      Height:         Intake/Output Summary (Last 24 hours) at 04/26/2018 0806 Last data filed at 04/25/2018 1900 Gross per 24 hour  Intake 240 ml  Output -  Net 240 ml      PHYSICAL EXAM  General: Well developed, well nourished, appears mildly uncomfortable  HEENT:  Normocephalic and atramatic Neck:  No JVD.  Lungs: Clear bilaterally to auscultation and percussion. Heart: HRRR . Normal S1 and S2 without gallops or murmurs.  Abdomen: Bowel sounds are positive, abdomen soft and non-tender  Msk:  Back normal. Normal strength and tone for age. Extremities: No clubbing, cyanosis or edema.   Neuro: Alert and oriented X 3. Psych:  Good affect, responds appropriately   LABS: Basic Metabolic Panel: Recent Labs    04/25/18 0419  NA 138  K 4.1  CL 108  CO2 25  GLUCOSE 89  BUN 14  CREATININE 0.68  CALCIUM 8.3*   Liver Function Tests: No results for input(s): AST, ALT, ALKPHOS, BILITOT, PROT, ALBUMIN in the last 72 hours. No results for input(s): LIPASE, AMYLASE in the last 72  hours. CBC: Recent Labs    04/25/18 0419 04/26/18 0332  WBC 2.8* 4.0  HGB 9.7* 10.7*  HCT 27.9* 30.5*  MCV 101.8* 102.0*  PLT 106* 137*   Cardiac Enzymes: No results for input(s): CKTOTAL, CKMB, CKMBINDEX, TROPONINI in the last 72 hours. BNP: Invalid input(s): POCBNP D-Dimer: No results for input(s): DDIMER in the last 72 hours. Hemoglobin A1C: No results for input(s): HGBA1C in the last 72 hours. Fasting Lipid Panel: No results for input(s): CHOL, HDL, LDLCALC, TRIG, CHOLHDL, LDLDIRECT in the last 72 hours. Thyroid Function Tests: No results for input(s): TSH, T4TOTAL, T3FREE, THYROIDAB in the last 72 hours.  Invalid input(s): FREET3 Anemia Panel: No results for input(s): VITAMINB12, FOLATE, FERRITIN, TIBC, IRON, RETICCTPCT in the last 72 hours.  Dg Knee Left Port  Result Date: 04/24/2018 CLINICAL DATA:  Status post left total knee joint prosthesis placement. EXAM: PORTABLE LEFT KNEE - 1-2 VIEW COMPARISON:  MRI of the left knee of Feb 26, 2018 FINDINGS: The patient has undergone total knee joint prosthesis placement. Radiographic positioning of the prosthetic components is good. The interface with the native bone appears normal. There is no acute native bone fracture. Surgical skin staples are present. IMPRESSION: No immediate postprocedure complication following left total knee joint prosthesis placement. Electronically Signed   By: David  Martinique M.D.   On: 04/24/2018 12:54     Echo: Last echo in  08/2017 showed normal EF of 60-65% with mild LVH.    ASSESSMENT AND PLAN:  Active Problems:   S/P TKR (total knee replacement) using cement, left    1. Bradycardia              - Patient asymptomatic,last rate at 85, continue to monitor             - Follow up outpatient cardiology, Holter monitor to further evaluate  2. Atypical chest pain              - Patient asymptomatic, unlikely to be ischemic in nature              - Follow up outpatient cardiology    The  history, physical exam findings, and plan of care were discussed with Dr. Bartholome Bill and all decision making was made in collaboration   Signing off of this patient's care for now. If problems arise, please call.   Avie Arenas, PA-C 04/26/2018 8:06 AM

## 2018-04-26 NOTE — Telephone Encounter (Signed)
-----   Message from Karen Kitchens, NP sent at 04/26/2018  3:21 PM EDT ----- Regarding: Call I spoke with centralized scheduling, patient will need to:  1. Biopsy is scheduled for 07/22. She will need to be here by 9 am for a 10 am procedure.    2. Hold Xarelto on Sunday (07/21). Saturday should be her last dose.   3. NPO after midnight on Sunday. May have sips of water with am meds.   Thanks,  Gaspar Bidding

## 2018-04-26 NOTE — Progress Notes (Signed)
Physical Therapy Treatment Patient Details Name: Joanna Hall MRN: [id number] DOB: 17-Nov-1965 Today's Date: 04/26/2018    History of Present Illness Joanna Hall is a 52 year old female with a history hypertension, aortic atherosclerosis, and factor V leiden who underwent left knee arthroplasty with Dr. [name] on 04/24/18. Post operatively, patient was bradycardic and complaining of left-sided chest cramping. Today, patient states that the chest cramping has resolved and she hasn't needed to use ice to help with the symptoms. She complains of left knee pain, but denies fatigue, lightheadedness, dizziness, shortness of breath, palpitations, or lower extremity swelling. She also denies abdominal pain, N/V/D.     PT Comments    Pt was experiencing high levels of pain (10/10) on first attempt to treat. Pt was given relaxation techniques, and left for some time to rest and for pain levels to subside. Pain levels had subsided to 7/10 on next arrival and treatment was carried out. Pt was able to tolerate bed exercises and transfer from supine to sit with verbally expressed high levels of pain. Pt was able to perform sit to stand and ambulation activities. Gait training was done with min assist from therapist and RW with chair follow. Pt ambulated 125 feet but did need 3 breaks to sit due to pain levels and reported light headedness. Pt continues to show step to gait pattern and decreased weight shifting capacity. Pt did become nauseous following gait training but it subsided with rest. Pt showed good strength and stability during stair training, however expressed severe pain with activity (10/10). Pt will continue to benefit from skilled PT to address above deficits and promote optimal return to PLOF. Recommend transition to [facility] upon discharge from acute hospitalization.  Follow Up Recommendations  Home health PT     Equipment Recommendations  Rolling walker with 5" wheels    Recommendations for  Other Services       Precautions / Restrictions Precautions Precautions: Fall Restrictions Weight Bearing Restrictions: Yes LLE Weight Bearing: Weight bearing as tolerated    Mobility  Bed Mobility Overal bed mobility: Independent             General bed mobility comments: Pt demonstrated ability to bridge, slide, transfer supine to sit independently.   Transfers Overall transfer level: Needs assistance Equipment used: Rolling walker (2 wheeled) Transfers: Sit to/from Stand Sit to Stand: Min assist         General transfer comment: Pt able to trabsfer supine to sit independently however requires +1 min assist for sit to stand and stand to sit with assistive device.   Ambulation/Gait Ambulation/Gait assistance: Min assist Gait Distance (Feet): 125 Feet Assistive device: Rolling walker (2 wheeled) Gait Pattern/deviations: Step-to pattern;Decreased step length - right;Decreased stride length;Trunk flexed Gait velocity: decreased   General Gait Details: Pain present with walking ( NPS=9/10), decreased gait speed leading to increased stance phase bilat.    Stairs Stairs: Yes Stairs assistance: Mod assist;+2 physical assistance Stair Management: With walker;Forwards;Step to pattern Number of Stairs: 1 General stair comments: Pt expressed high pain levels with stairs and became emotional from pain experience. However pt was stable and strong with L knee duiring step up and step down pattern, both with leg leading and trailing.    Wheelchair Mobility    Modified Rankin (Stroke Patients Only)       Balance  Cognition Arousal/Alertness: Awake/alert Behavior During Therapy: WFL for tasks assessed/performed Overall Cognitive Status: Within Functional Limits for tasks assessed                                        Exercises Total Joint Exercises Ankle Circles/Pumps: AROM;Left;10  reps Quad Sets: Left;20 reps;AROM Heel Slides: Left;10 reps;Supine;AROM Other Exercises Other Exercises: Bed mobility: Supine to sit ind; Transfers: Sit to stand mod assist; Gait training with 2 wheel RW and mod assist, stair training ( 1 step) with RW and mod assist 2+    General Comments        Pertinent Vitals/Pain Pain Assessment: 0-10 Pain Score: 7  Pain Location: L knee Pain Descriptors / Indicators: Sharp;Aching;Constant Pain Intervention(s): Limited activity within patient's tolerance;Monitored during session;Repositioned;Utilized relaxation techniques;Premedicated before session;Ice applied    Home Living                      Prior Function            PT Goals (current goals can now be found in the care plan section) Progress towards PT goals: Progressing toward goals    Frequency    BID      PT Plan Current plan remains appropriate    Co-evaluation              AM-PAC PT "6 Clicks" Daily Activity  Outcome Measure  Difficulty turning over in bed (including adjusting bedclothes, sheets and blankets)?: A Little Difficulty moving from lying on back to sitting on the side of the bed? : A Little Difficulty sitting down on and standing up from a chair with arms (e.g., wheelchair, bedside commode, etc,.)?: Unable Help needed moving to and from a bed to chair (including a wheelchair)?: A Little Help needed walking in hospital room?: A Little Help needed climbing 3-5 steps with a railing? : A Lot 6 Click Score: 15    End of Session Equipment Utilized During Treatment: Gait belt Activity Tolerance: Patient limited by pain Patient left: with family/visitor present;with call bell/phone within reach;in chair;with chair alarm set;with SCD's reapplied   PT Visit Diagnosis: Unsteadiness on feet (R26.81);Other abnormalities of gait and mobility (R26.89)     Time: 0525-9102 PT Time Calculation (min) (ACUTE ONLY): 45 min  Charges:                        G Codes:       Joanna Hall, SPT 05/10/18,12:19 PM

## 2018-04-26 NOTE — Care Management (Addendum)
At discharge, patient to going to the home of her parents- Ronalee Belts and Ivan [address].  Patient has walker and bedside commode which will be at her parents' home.  Confirmed she will discharge home on her chronic high dose Xarelto.  No agency preference for home health physical therapy. Referral for physical therapy called to [facility].  If pain gets under control could discharge today- if not tomorrow. Family will transport home.

## 2018-04-27 LAB — CBC
HCT: 30 % — ABNORMAL LOW (ref 35.0–47.0)
HEMOGLOBIN: 10.7 g/dL — AB (ref 12.0–16.0)
MCH: 36 pg — AB (ref 26.0–34.0)
MCHC: 35.5 g/dL (ref 32.0–36.0)
MCV: 101.3 fL — AB (ref 80.0–100.0)
Platelets: 161 10*3/uL (ref 150–440)
RBC: 2.96 MIL/uL — AB (ref 3.80–5.20)
RDW: 12.9 % (ref 11.5–14.5)
WBC: 3.9 10*3/uL (ref 3.6–11.0)

## 2018-04-27 MED ORDER — OXYCODONE-ACETAMINOPHEN 10-325 MG PO TABS: 1.0000 | ORAL_TABLET | ORAL | 0 refills | Status: AC | PRN

## 2018-04-27 MED ORDER — OXYCODONE HCL 10 MG PO TABS: 10.0000 mg | ORAL_TABLET | ORAL | 0 refills | Status: DC | PRN

## 2018-04-27 NOTE — Discharge Summary (Signed)
Physician Discharge Summary  Patient ID: Joanna Hall MRN: [id number] DOB/AGE: 1965/10/30 52 y.o.  Admit date: 04/24/2018 Discharge date: 04/27/2018  Admission Diagnoses:  OSTEOARTHRITIS OF LEFT KNEE <principal problem not specified>  Discharge Diagnoses:  OSTEOARTHRITIS OF LEFT KNEE Active Problems:   S/P TKR (total knee replacement) using cement, left   Past Medical History:  Diagnosis Date  . Anxiety   . Aortic atherosclerosis ([place])   . Arthritis    neck and knees  . 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 Blood transfusion without reported diagnosis   . Cervical cancer ([place])   . Chronic anal fissure   . Chronic diarrhea   . Dyspnea   . Factor V Leiden mutation ([place])   . Fecal incontinence   . Genital warts   . GERD (gastroesophageal reflux disease)   . Heart murmur   . Hemorrhoids   . 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 History of cancer chemotherapy    completed 06/2017  . History of Clostridium difficile infection    while undergoing chemo.  Negative test 10/2017  . Infarction of kidney ([place]) left kidney   and uterus  . Macrocytic anemia with vitamin B12 deficiency   . Perianal condylomata   . Pneumonia    History of  . Pulmonary nodules   . Small bowel obstruction ([place]) 08/2017  . Stiff neck    limited right turn  . Vitamin D deficiency     Surgeries: Procedure(s): TOTAL KNEE ARTHROPLASTY on 04/24/2018   Consultants (if any):   Discharged Condition: Improved  Hospital Course: Joanna Hall is an 52 y.o. female who was admitted 04/24/2018 with a diagnosis of  OSTEOARTHRITIS OF LEFT KNEE <principal problem not specified> and went to the operating room on 04/24/2018 and underwent the above named procedures.    She was given perioperative antibiotics:  Anti-infectives (From admission, onward)   Start     Dose/Rate Route Frequency Ordered Stop   04/24/18 1600  ceFAZolin (ANCEF) IVPB 1 g/50 mL premix      1 g 100 mL/hr over 30 Minutes Intravenous Every 6 hours 04/24/18 1450 04/24/18 2157   04/24/18 1135  50,000 units bacitracin in 0.9% normal saline 250 mL irrigation  Status:  Discontinued       As needed 04/24/18 1138 04/24/18 1204   04/24/18 0807  ceFAZolin (ANCEF) 2-4 GM/100ML-% IVPB    Note to Pharmacy:  Dewayne Hatch   : cabinet override      04/24/18 0807 04/24/18 1007   04/24/18 0600  ceFAZolin (ANCEF) IVPB 2g/100 mL premix     2 g 200 mL/hr over 30 Minutes Intravenous On call to O.R. 04/24/18 0102 04/24/18 1007    .  She was given sequential compression devices, early ambulation, and Xarelto for DVT prophylaxis.  She benefited maximally from the hospital stay and there were no complications.    Recent vital signs:  Vitals:   04/26/18 2347 04/27/18 0801  BP: 106/83 119/81  Pulse: (!) 103 90  Resp:  18  Temp: 99.1 F (37.3 C) 98.2 F (36.8 C)  SpO2: 97% 100%    Recent laboratory studies:  Lab Results  Component Value Date   HGB 10.7 (L) 04/27/2018   HGB 10.7 (L) 04/26/2018   HGB 9.7 (L) 04/25/2018   Lab Results  Component Value Date   WBC 3.9 04/27/2018   PLT 161 04/27/2018  Lab Results  Component Value Date   INR 0.88 04/24/2018   Lab Results  Component Value Date   NA 138 04/25/2018   K 4.1 04/25/2018   CL 108 04/25/2018   CO2 25 04/25/2018   BUN 14 04/25/2018   CREATININE 0.68 04/25/2018   GLUCOSE 89 04/25/2018    Discharge Medications:   Allergies as of 04/27/2018      Reactions   Ketamine Anxiety, Other (See Comments)   Syncope episode/confusion      Medication List    STOP taking these medications   oxyCODONE-acetaminophen 5-325 MG tablet Commonly known as:  PERCOCET/ROXICET     TAKE these medications   ALOE VERA CONCENTRATE PO Take 1 tablet by mouth daily.   Biotin 1000 MCG tablet Take 1,000 mcg by mouth daily.   cholestyramine 4 g packet Commonly known as:  QUESTRAN Take 1 packet (4 g total) by mouth 3 (three) times  daily. What changed:  when to take this   cyanocobalamin 1000 MCG/ML injection Commonly known as:  (VITAMIN B-12) Inject 1ML every week x 4 weeks, then 1ML every other week x 2 months, then monthly x 3 months What changed:    how much to take  how to take this  when to take this  additional instructions   dicyclomine 10 MG capsule Commonly known as:  BENTYL Take 1 capsule (10 mg total) by mouth 4 (four) times daily -  before meals and at bedtime.   ENZYME DIGEST PO Take 1 tablet by mouth daily.   fluticasone 50 MCG/ACT nasal spray Commonly known as:  FLONASE Place 2 sprays into both nostrils daily. What changed:    when to take this  reasons to take this   hydrocortisone 2.5 % rectal cream Commonly known as:  ANUSOL-HC Place 1 application rectally 2 (two) times daily. What changed:    when to take this  reasons to take this   IMODIUM A-D 2 MG tablet Generic drug:  loperamide Take 2 mg by mouth 4 (four) times daily as needed for diarrhea or loose stools.   loratadine 10 MG tablet Commonly known as:  CLARITIN Take 10 mg by mouth daily.   multivitamin with minerals Tabs tablet Take 1 tablet daily by mouth.   Oxycodone HCl 10 MG Tabs Take 1 tablet (10 mg total) by mouth every 4 (four) hours as needed for severe pain (pain score 7-10).   PROBIOMAX DAILY DF PO Take 1 tablet by mouth daily.   rivaroxaban 20 MG Tabs tablet Commonly known as:  XARELTO Take 1 tablet (20 mg total) by mouth daily with supper.   sertraline 50 MG tablet Commonly known as:  ZOLOFT Take 1 tablet (50 mg total) by mouth daily.   SYRINGE 3CC/22GX1" 22G X 1" 3 ML Misc For use with Vitamin B12 injections   VENTOLIN HFA 108 (90 Base) MCG/ACT inhaler Generic drug:  albuterol INHALE 1-2 PUFFS BY MOUTH EVERY 4 HOURS AS NEEDED FOR WHEEZING / SHORTNESS OF BREATH   Vitamin D (Ergocalciferol) 50000 units Caps capsule Commonly known as:  DRISDOL Take 50,000 Units by mouth every  Thursday.       Diagnostic Studies: Ct Chest W Contrast  Result Date: 04/15/2018 CLINICAL DATA:  Urinary frequency for several days. Cervical cancer restaging. Lung nodules. EXAM: CT CHEST, ABDOMEN, AND PELVIS WITH CONTRAST TECHNIQUE: Multidetector CT imaging of the chest, abdomen and pelvis was performed following the standard protocol during bolus administration of intravenous contrast. CONTRAST:  138m ISOVUE-300  IOPAMIDOL (ISOVUE-300) INJECTION 61% COMPARISON:  Multiple exams, including PET-CT from 11/08/2017 FINDINGS: CT CHEST FINDINGS Cardiovascular: Aortic arch atherosclerotic calcification. Mediastinum/Nodes: Unremarkable Lungs/Pleura: Numerous scattered enlarging pulmonary nodules are present. One index nodule in the right lower lobe on image 79/4 measures 1.0 by 1.1 cm, previously 0.6 by 0.6 cm. Many of the nodules demonstrate increased cavitation, and most of the nodules are cavitary. The vast majority of the nodules appear to have been present previously. Over 100 pulmonary nodules are present. Musculoskeletal: Unremarkable CT ABDOMEN PELVIS FINDINGS Hepatobiliary: Cholecystectomy. Common bile duct 1.1 cm in diameter, stable. Pancreas: Unremarkable Spleen: Unremarkable Adrenals/Urinary Tract: Unremarkable Stomach/Bowel: Ill-defined wall thickening in the rectosigmoid as before, with surrounding perirectal edema and edema tracking along perirectal fascia planes. Right hemicolectomy. Vascular/Lymphatic: Aortoiliac atherosclerotic vascular disease. Portacaval node 1.1 cm, roughly stable. Small periaortic lymph nodes are present. Reproductive: Heterogeneous enhancement in the uterus. Other: No supplemental non-categorized findings. Musculoskeletal: Lumbar spondylosis and degenerative disc disease noted with suspected left impingement at L1-2 and L5-S1, and suspected right impingement at L3-4, L4-5, and L5-S1. IMPRESSION: 1. Innumerable cavitary nodules scattered in the lungs, moderately enlarging  compared to the 11/08/2017 PET-CT, suspicious for metastatic disease. I do not see any definite new nodules. 2. Ill-defined wall thickening in the rectosigmoid with surrounding stranding along fascia planes, probably sequela from prior radiation therapy. 3. Multilevel lumbar impingement due to spondylosis and degenerative disc disease. 4.  Aortic Atherosclerosis (ICD10-I70.0). 5. Heterogeneous enhancement in the uterus. Some of this may be from prior radiation therapy. Electronically Signed   By: Van Clines M.D.   On: 04/15/2018 12:25   Ct Abdomen Pelvis W Contrast  Result Date: 04/15/2018 CLINICAL DATA:  Urinary frequency for several days. Cervical cancer restaging. Lung nodules. EXAM: CT CHEST, ABDOMEN, AND PELVIS WITH CONTRAST TECHNIQUE: Multidetector CT imaging of the chest, abdomen and pelvis was performed following the standard protocol during bolus administration of intravenous contrast. CONTRAST:  178m ISOVUE-300 IOPAMIDOL (ISOVUE-300) INJECTION 61% COMPARISON:  Multiple exams, including PET-CT from 11/08/2017 FINDINGS: CT CHEST FINDINGS Cardiovascular: Aortic arch atherosclerotic calcification. Mediastinum/Nodes: Unremarkable Lungs/Pleura: Numerous scattered enlarging pulmonary nodules are present. One index nodule in the right lower lobe on image 79/4 measures 1.0 by 1.1 cm, previously 0.6 by 0.6 cm. Many of the nodules demonstrate increased cavitation, and most of the nodules are cavitary. The vast majority of the nodules appear to have been present previously. Over 100 pulmonary nodules are present. Musculoskeletal: Unremarkable CT ABDOMEN PELVIS FINDINGS Hepatobiliary: Cholecystectomy. Common bile duct 1.1 cm in diameter, stable. Pancreas: Unremarkable Spleen: Unremarkable Adrenals/Urinary Tract: Unremarkable Stomach/Bowel: Ill-defined wall thickening in the rectosigmoid as before, with surrounding perirectal edema and edema tracking along perirectal fascia planes. Right hemicolectomy.  Vascular/Lymphatic: Aortoiliac atherosclerotic vascular disease. Portacaval node 1.1 cm, roughly stable. Small periaortic lymph nodes are present. Reproductive: Heterogeneous enhancement in the uterus. Other: No supplemental non-categorized findings. Musculoskeletal: Lumbar spondylosis and degenerative disc disease noted with suspected left impingement at L1-2 and L5-S1, and suspected right impingement at L3-4, L4-5, and L5-S1. IMPRESSION: 1. Innumerable cavitary nodules scattered in the lungs, moderately enlarging compared to the 11/08/2017 PET-CT, suspicious for metastatic disease. I do not see any definite new nodules. 2. Ill-defined wall thickening in the rectosigmoid with surrounding stranding along fascia planes, probably sequela from prior radiation therapy. 3. Multilevel lumbar impingement due to spondylosis and degenerative disc disease. 4.  Aortic Atherosclerosis (ICD10-I70.0). 5. Heterogeneous enhancement in the uterus. Some of this may be from prior radiation therapy. Electronically Signed   By: WThayer Jew  Janeece Fitting M.D.   On: 04/15/2018 12:25   Nm Pet Image Initial (pi) Skull Base To Thigh  Result Date: 04/23/2018 CLINICAL DATA:  Subsequent treatment strategy for cervical cancer. Multiple pulmonary nodules. EXAM: NUCLEAR MEDICINE PET SKULL BASE TO THIGH TECHNIQUE: 8.3 mCi F-18 FDG was injected intravenously. Full-ring PET imaging was performed from the skull base to thigh after the radiotracer. CT data was obtained and used for attenuation correction and anatomic localization. Fasting blood glucose: 80 mg/dl COMPARISON:  Multiple exams, including CT scan of 04/15/2018 and outside PET-CT from [facility] dated 11/08/2017 FINDINGS: Mediastinal blood pool activity: SUV max 2.5 NECK: No significant abnormal hypermetabolic activity in this region. Incidental CT findings: Mild chronic right maxillary sinusitis. CHEST: Roughly similar appearance of innumerable solid and cavitary  nodules scattered in both lungs. When I compare back to the prior PET-CT from 11/08/2017 these nodules have clearly increased in size and have also had some increase in activity, although hypermetabolic activity remains relatively low-grade. For example, an index nodule in the left upper lobe on image 83/4 appears solid and measures 1.1 by 0.9 cm, previously 0.6 by 0.7 cm, with maximum standard uptake value 1.9, formerly 0.9. A 7 mm nodule in the right lower lobe has a maximum SUV of 2.3. Other nodules likewise have low-grade metabolic activity. Most of the nodules or at or below 1 cm in diameter. No enlarged or hypermetabolic lymph nodes in the chest. Incidental CT findings: Aortic arch atherosclerotic vascular disease. ABDOMEN/PELVIS: Physiologic accentuation of activity in segments of bowel. This primarily involves the colon. No appreciable uterine hypermetabolic activity. Upper normal sized porta hepatis lymph nodes are not hypermetabolic. No pathologic retroperitoneal or pelvic adenopathy. Incidental CT findings: Cholecystectomy. Aortoiliac atherosclerotic vascular disease. Postoperative findings in the right colon. Presacral stranding, likely due to prior radiation therapy. SKELETON: No significant abnormal hypermetabolic activity in this region. Incidental CT findings: Dental cavities noted. IMPRESSION: 1. The numerous scattered solid and cavitary nodules in the lungs are essentially stable from 04/15/2018 but have increased in size compared to the prior PET-CT from 11/08/2017. These demonstrate low-grade metabolic activity up to a maximum SUV of about 2.3, which is also increased from 11/08/2017. Appearance favors cavitary malignancy over infectious processes such as fungal disease. 2. Post therapy related findings in the pelvis. 3. Other imaging findings of potential clinical significance: Mild chronic right maxillary sinusitis. Aortic Atherosclerosis (ICD10-I70.0). Dental cavities. Electronically Signed    By: Van Clines M.D.   On: 04/23/2018 11:55   US Venous Img Lower Unilateral Right  Result Date: 04/17/2018 CLINICAL DATA:  Pain x2 weeks EXAM: RIGHT LOWER EXTREMITY VENOUS DOPPLER ULTRASOUND TECHNIQUE: Gray-scale sonography with compression, as well as color and duplex ultrasound, were performed to evaluate the deep venous system from the level of the common femoral vein through the popliteal and proximal calf veins. COMPARISON:  None FINDINGS: Normal compressibility of the common femoral, superficial femoral, and popliteal veins, as well as the proximal calf veins. No filling defects to suggest DVT on grayscale or color Doppler imaging. Doppler waveforms show normal direction of venous flow, normal respiratory phasicity and response to augmentation. Survey views of the contralateral common femoral vein are unremarkable. IMPRESSION: No evidence of right lower extremity deep vein thrombosis. Electronically Signed   By: Lucrezia Europe M.D.   On: 04/17/2018 14:30   Dg Knee Left Port  Result Date: 04/24/2018 CLINICAL DATA:  Status post left total knee joint prosthesis placement. EXAM: PORTABLE LEFT KNEE - 1-2 VIEW  COMPARISON:  MRI of the left knee of Feb 26, 2018 FINDINGS: The patient has undergone total knee joint prosthesis placement. Radiographic positioning of the prosthetic components is good. The interface with the native bone appears normal. There is no acute native bone fracture. Surgical skin staples are present. IMPRESSION: No immediate postprocedure complication following left total knee joint prosthesis placement. Electronically Signed   By: David  Martinique M.D.   On: 04/24/2018 12:54    Disposition: Discharge disposition: 01-Home or Self Care      With home health physical therapy      Signed: Lovell Sheehan ,MD 04/27/2018, 12:33 PM

## 2018-04-27 NOTE — Progress Notes (Addendum)
Patient is being discharged to home with HH/PT.  DC & RX instructions given and patient acknowledged understanding. IV removed and belongings packed. NT to help patient get ready for transport home via private vehicle.

## 2018-04-27 NOTE — Plan of Care (Signed)
  Problem: Health Behavior/Discharge Planning: Goal: Ability to manage health-related needs will improve Outcome: Progressing   Problem: Activity: Goal: Risk for activity intolerance will decrease Outcome: Progressing   Problem: Nutrition: Goal: Adequate nutrition will be maintained Outcome: Progressing   Problem: Coping: Goal: Level of anxiety will decrease Outcome: Progressing   Problem: Pain Managment: Goal: General experience of comfort will improve Outcome: Progressing   Problem: Safety: Goal: Ability to remain free from injury will improve Outcome: Progressing   Problem: Skin Integrity: Goal: Risk for impaired skin integrity will decrease Outcome: Progressing   Problem: Education: Goal: Knowledge of the prescribed therapeutic regimen will improve Outcome: Progressing   Problem: Activity: Goal: Range of joint motion will improve Outcome: Progressing   Problem: Pain Management: Goal: Pain level will decrease with appropriate interventions Outcome: Progressing   Problem: Skin Integrity: Goal: Signs of wound healing will improve Outcome: Progressing

## 2018-04-27 NOTE — Care Management Note (Signed)
Case Management Note  Patient Details  Name: DONNI OGLESBY MRN: [id number] Date of Birth: [birth year]  Subjective/Objective:  Patient to be discharged today per MD order. Orders in place for HHPT services. Previously worked up with [facility]. Patient in agreement to discharge with these services. Notified Jermaine of referral who agrees to accept the patient. Walker already in the home. No further needs.  Ines Bloomer RN BSN RNCM [phone]                    Action/Plan:   Expected Discharge Date:  04/27/18               Expected Discharge Plan:     In-House Referral:     Discharge planning Services  CM Consult  Post Acute Care Choice:  Home Health Choice offered to:  Patient  DME Arranged:    DME Agency:     HH Arranged:  PT [place]:  [facility]  Status of Service:  Completed, signed off  If discussed at [place] of Stay Meetings, dates discussed:    Additional Comments:  Latanya Maudlin, RN 04/27/2018, 2:24 PM

## 2018-04-27 NOTE — Progress Notes (Signed)
Physical Therapy Treatment Patient Details Name: Joanna Hall MRN: [id number] DOB: 03/04/1966 Today's Date: 04/27/2018    History of Present Illness Joanna Hall is a 52 year old female with a history hypertension, aortic atherosclerosis, and factor V leiden who underwent left knee arthroplasty with Dr. [name] on 04/24/18. Post operatively, patient was bradycardic and complaining of left-sided chest cramping. Today, patient states that the chest cramping has resolved and she hasn't needed to use ice to help with the symptoms. She complains of left knee pain, but denies fatigue, lightheadedness, dizziness, shortness of breath, palpitations, or lower extremity swelling. She also denies abdominal pain, N/V/D.     PT Comments    Participated in exercises as described below.  To edge of bed with supervision.  Stood and ambulated with slow gait but no LOB/buckling.  She did require verbal cues to not step too far into walker but overall did well.  Limited by pain.  Returned to bed after session per her request.   Follow Up Recommendations  Home health PT     Equipment Recommendations  Rolling walker with 5" wheels    Recommendations for Other Services       Precautions / Restrictions Precautions Precautions: Fall Precaution Booklet Issued: No Restrictions Weight Bearing Restrictions: Yes LLE Weight Bearing: Weight bearing as tolerated    Mobility  Bed Mobility Overal bed mobility: Needs Assistance Bed Mobility: Supine to Sit;Sit to Supine       Sit to supine: Min assist   General bed mobility comments: for LE management.  Taught self assist strategies but unable  Transfers Overall transfer level: Modified independent Equipment used: Rolling walker (2 wheeled) Transfers: Sit to/from Stand Sit to Stand: Min guard            Ambulation/Gait Ambulation/Gait assistance: Min guard Gait Distance (Feet): 120 Feet Assistive device: Rolling walker (2 wheeled) Gait  Pattern/deviations: Step-to pattern Gait velocity: decreased Gait velocity interpretation: <1.31 ft/sec, indicative of household ambulator General Gait Details: slow step to gait with verbal cues to not step to close to walker.   Stairs         General stair comments: declined trial today.  Stated she was comfortable.   Wheelchair Mobility    Modified Rankin (Stroke Patients Only)       Balance Overall balance assessment: Needs assistance Sitting-balance support: No upper extremity supported;Feet supported Sitting balance-Leahy Scale: Good     Standing balance support: Bilateral upper extremity supported;During functional activity Standing balance-Leahy Scale: Fair Standing balance comment: heavy reliance on walker for support.                            Cognition Arousal/Alertness: Awake/alert Behavior During Therapy: WFL for tasks assessed/performed Overall Cognitive Status: Within Functional Limits for tasks assessed                                        Exercises Total Joint Exercises Ankle Circles/Pumps: AROM;Left;10 reps Quad Sets: Left;AROM;10 reps Short Arc Quad: AROM;Left;10 reps;AAROM Heel Slides: AROM;10 reps;Left;AAROM Straight Leg Raises: AAROM;10 reps;Left Goniometric ROM: 4-96 - self limits due to pain.    General Comments        Pertinent Vitals/Pain Pain Assessment: 0-10 Pain Score: 5  Pain Location: L knee Pain Descriptors / Indicators: Sharp;Aching;Constant Pain Intervention(s): Limited activity within patient's tolerance;Premedicated before session;Monitored during session;Ice applied  Home Living                      Prior Function            PT Goals (current goals can now be found in the care plan section) Progress towards PT goals: Progressing toward goals    Frequency    BID      PT Plan Current plan remains appropriate    Co-evaluation              AM-PAC PT "6  Clicks" Daily Activity  Outcome Measure  Difficulty turning over in bed (including adjusting bedclothes, sheets and blankets)?: None Difficulty moving from lying on back to sitting on the side of the bed? : None Difficulty sitting down on and standing up from a chair with arms (e.g., wheelchair, bedside commode, etc,.)?: None Help needed moving to and from a bed to chair (including a wheelchair)?: A Little Help needed walking in hospital room?: A Little Help needed climbing 3-5 steps with a railing? : A Lot 6 Click Score: 20    End of Session Equipment Utilized During Treatment: Gait belt Activity Tolerance: Patient limited by pain Patient left: with bed alarm set;with call bell/phone within reach;in bed         Time: 7473-4037 PT Time Calculation (min) (ACUTE ONLY): 28 min  Charges:  $Gait Training: 8-22 mins $Therapeutic Exercise: 8-22 mins                    G Codes:      Chesley Noon, PTA 04/27/18, 11:09 AM

## 2018-04-27 NOTE — Discharge Instructions (Signed)
Continue weight bear as tolerated on the left lower extremity.    Elevate the left lower extremity whenever possible and continue the polar care while elevating the extremity. Patient may shower. No bath or submerging the wound.    Take Xarelto as directed for blood clot prevention.  Continue to work on knee range of motion exercises at home as instructed by physical therapy. Continue to use a walker for assistance with ambulation until cleared by physical therapy.  Call [phone] with any questions, such as fever > 101.5 degrees, drainage from the wound or shortness of breath.

## 2018-04-30 ENCOUNTER — Telehealth: Payer: Self-pay

## 2018-04-30 NOTE — Telephone Encounter (Signed)
EMMI Follow-up: Noted on the report that patient had unfilled Rx's.  I talked with Joanna Hall and she said they had everything worked out and Rx's have been filled.  No needs noted.  I explained there would be a 2nd automated call with a different series of questions and to let us know if she had any concerns at that time.

## 2018-05-03 ENCOUNTER — Telehealth: Payer: Self-pay | Admitting: Licensed Clinical Social Worker

## 2018-05-03 ENCOUNTER — Other Ambulatory Visit: Payer: Self-pay | Admitting: Radiology

## 2018-05-03 NOTE — Telephone Encounter (Signed)
EMMI flagged patient for answering yes to feeling sad/hopelss/anxious/empty. Clinical Education officer, museum ([name]) contacted patient via telephone and was able to reach her. Per patient she is doing good and had her last home health PT session today. Per patient she is going to see Dr. [name] 05/07/18 and will start outpatient PT. Per patient she has a history of depression and is on zoloft. Per patient she is having a little bit of anxiety because she is having a biopsy Monday. [name] became tearful and reported that she is worried she has lung cancer. [name] provided emotional support and gave patient counseling resources including [place] and [employer]. Per patient she is not having thoughts of hurting herself. Patient reported no other needs or concerns.   [employer], LCSW [phone]

## 2018-05-04 DIAGNOSIS — S52509A Unspecified fracture of the lower end of unspecified radius, initial encounter for closed fracture: Secondary | ICD-10-CM | POA: Insufficient documentation

## 2018-05-06 ENCOUNTER — Ambulatory Visit
Admission: RE | Admit: 2018-05-06 | Discharge: 2018-05-06 | Disposition: A | Discharge status: Home / Self Care | Payer: Medicaid Other | Source: Ambulatory Visit | Attending: Interventional Radiology | Admitting: Interventional Radiology

## 2018-05-06 ENCOUNTER — Ambulatory Visit
Admit: 2018-05-06 | Discharge: 2018-05-06 | Disposition: A | Discharge status: Home / Self Care | Payer: Medicaid Other | Attending: Family Medicine | Admitting: Family Medicine

## 2018-05-06 ENCOUNTER — Encounter: Payer: Self-pay | Admitting: Hematology and Oncology

## 2018-05-06 DIAGNOSIS — F419 Anxiety disorder, unspecified: Secondary | ICD-10-CM | POA: Diagnosis not present

## 2018-05-06 DIAGNOSIS — E559 Vitamin D deficiency, unspecified: Secondary | ICD-10-CM | POA: Insufficient documentation

## 2018-05-06 DIAGNOSIS — D6851 Activated protein C resistance: Secondary | ICD-10-CM | POA: Diagnosis not present

## 2018-05-06 DIAGNOSIS — Z79899 Other long term (current) drug therapy: Secondary | ICD-10-CM | POA: Diagnosis not present

## 2018-05-06 DIAGNOSIS — Z87891 Personal history of nicotine dependence: Secondary | ICD-10-CM | POA: Diagnosis not present

## 2018-05-06 DIAGNOSIS — J320 Chronic maxillary sinusitis: Secondary | ICD-10-CM | POA: Insufficient documentation

## 2018-05-06 DIAGNOSIS — D519 Vitamin B12 deficiency anemia, unspecified: Secondary | ICD-10-CM | POA: Diagnosis not present

## 2018-05-06 DIAGNOSIS — R918 Other nonspecific abnormal finding of lung field: Secondary | ICD-10-CM

## 2018-05-06 DIAGNOSIS — Z7901 Long term (current) use of anticoagulants: Secondary | ICD-10-CM | POA: Insufficient documentation

## 2018-05-06 DIAGNOSIS — Z923 Personal history of irradiation: Secondary | ICD-10-CM | POA: Insufficient documentation

## 2018-05-06 DIAGNOSIS — R911 Solitary pulmonary nodule: Secondary | ICD-10-CM | POA: Diagnosis present

## 2018-05-06 DIAGNOSIS — R350 Frequency of micturition: Secondary | ICD-10-CM | POA: Diagnosis not present

## 2018-05-06 DIAGNOSIS — I700 Atherosclerosis of aorta: Secondary | ICD-10-CM | POA: Insufficient documentation

## 2018-05-06 DIAGNOSIS — Z86711 Personal history of pulmonary embolism: Secondary | ICD-10-CM | POA: Insufficient documentation

## 2018-05-06 DIAGNOSIS — Z96652 Presence of left artificial knee joint: Secondary | ICD-10-CM | POA: Diagnosis not present

## 2018-05-06 DIAGNOSIS — C7802 Secondary malignant neoplasm of left lung: Secondary | ICD-10-CM | POA: Diagnosis not present

## 2018-05-06 DIAGNOSIS — K219 Gastro-esophageal reflux disease without esophagitis: Secondary | ICD-10-CM | POA: Diagnosis not present

## 2018-05-06 DIAGNOSIS — Z9889 Other specified postprocedural states: Secondary | ICD-10-CM | POA: Diagnosis present

## 2018-05-06 DIAGNOSIS — B182 Chronic viral hepatitis C: Secondary | ICD-10-CM | POA: Insufficient documentation

## 2018-05-06 DIAGNOSIS — Z8249 Family history of ischemic heart disease and other diseases of the circulatory system: Secondary | ICD-10-CM | POA: Insufficient documentation

## 2018-05-06 DIAGNOSIS — C539 Malignant neoplasm of cervix uteri, unspecified: Secondary | ICD-10-CM | POA: Diagnosis present

## 2018-05-06 DIAGNOSIS — Z8541 Personal history of malignant neoplasm of cervix uteri: Secondary | ICD-10-CM | POA: Insufficient documentation

## 2018-05-06 LAB — CBC
HCT: 33.4 % — ABNORMAL LOW (ref 35.0–47.0)
HEMOGLOBIN: 11.6 g/dL — AB (ref 12.0–16.0)
MCH: 34.9 pg — AB (ref 26.0–34.0)
MCHC: 34.6 g/dL (ref 32.0–36.0)
MCV: 100.9 fL — AB (ref 80.0–100.0)
PLATELETS: 294 10*3/uL (ref 150–440)
RBC: 3.31 MIL/uL — AB (ref 3.80–5.20)
RDW: 12.9 % (ref 11.5–14.5)
WBC: 3.5 10*3/uL — AB (ref 3.6–11.0)

## 2018-05-06 LAB — PROTIME-INR
INR: 0.93
PROTHROMBIN TIME: 12.4 s (ref 11.4–15.2)

## 2018-05-06 MED ORDER — FENTANYL CITRATE (PF) 100 MCG/2ML IJ SOLN
INTRAMUSCULAR | Status: AC
  Filled 2018-05-06: qty 4

## 2018-05-06 MED ORDER — HYDROMORPHONE HCL 4 MG PO TABS
4.0000 mg | ORAL_TABLET | ORAL | Status: DC | PRN
  Administered 2018-05-06: 4 mg via ORAL
  Filled 2018-05-06 (×3): qty 1

## 2018-05-06 MED ORDER — FENTANYL CITRATE (PF) 100 MCG/2ML IJ SOLN
INTRAMUSCULAR | Status: AC | PRN
  Administered 2018-05-06: 12.5 ug via INTRAVENOUS
  Administered 2018-05-06: 25 ug via INTRAVENOUS
  Administered 2018-05-06: 12.5 ug via INTRAVENOUS
  Administered 2018-05-06 (×2): 25 ug via INTRAVENOUS

## 2018-05-06 MED ORDER — LIDOCAINE HCL (PF) 1 % IJ SOLN
INTRAMUSCULAR | Status: AC | PRN
  Administered 2018-05-06: 5 mL

## 2018-05-06 MED ORDER — SODIUM CHLORIDE 0.9 % IV SOLN
INTRAVENOUS | Status: DC
  Administered 2018-05-06: 10:00:00 via INTRAVENOUS

## 2018-05-06 MED ORDER — MIDAZOLAM HCL 5 MG/5ML IJ SOLN
INTRAMUSCULAR | Status: AC | PRN
  Administered 2018-05-06 (×4): 0.5 mg via INTRAVENOUS

## 2018-05-06 MED ORDER — MIDAZOLAM HCL 5 MG/5ML IJ SOLN
INTRAMUSCULAR | Status: AC
  Filled 2018-05-06: qty 5

## 2018-05-06 NOTE — Consult Note (Signed)
Chief Complaint: Pulmonary nodules  Referring Physician(s): [place]  Patient Status: [facility] - Out-pt  History of Present Illness: Joanna Hall is a 52 y.o. female with past medical history significant for factor V Leiden mutation, pulmonary embolism, aortic atherosclerosis, hepatitis C, and most significantly, cervical cancer, who presents today for CT-guided pulmonary nodule biopsy.  The patient is unaccompanied and serves as her own historian.  Patient reports moderate to severe knee pain associated with recent total knee replacement.    She is otherwise without complaint.  Specifically no chest pain, cough, hemoptysis, fever or chills.  Past Medical History:  Diagnosis Date  . Anxiety   . Aortic atherosclerosis ([place])   . Arthritis    neck and knees  . 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 Blood transfusion without reported diagnosis   . Cervical cancer ([place])   . Chronic anal fissure   . Chronic diarrhea   . Dyspnea   . Factor V Leiden mutation ([place])   . Fecal incontinence   . Genital warts   . GERD (gastroesophageal reflux disease)   . Heart murmur   . Hemorrhoids   . 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 History of cancer chemotherapy    completed 06/2017  . History of Clostridium difficile infection    while undergoing chemo.  Negative test 10/2017  . 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 Macrocytic anemia with vitamin B12 deficiency   . Perianal condylomata   . Pneumonia    History of  . Pulmonary nodules   . Small bowel obstruction ([place]) 08/2017  . Stiff neck    limited right turn  . Vitamin D deficiency     Past Surgical History:  Procedure Laterality Date  . CHOLECYSTECTOMY    . COLON SURGERY  08/2017   resection  . COLONOSCOPY WITH PROPOFOL N/A 12/20/2017   Procedure: COLONOSCOPY WITH PROPOFOL;  Surgeon: [name], MD;  Location: [facility] ENDOSCOPY;  Service:  Gastroenterology;  Laterality: N/A;  . DIAGNOSTIC LAPAROSCOPY    . ESOPHAGOGASTRODUODENOSCOPY (EGD) WITH PROPOFOL N/A 12/20/2017   Procedure: ESOPHAGOGASTRODUODENOSCOPY (EGD) WITH PROPOFOL;  Surgeon: [name], MD;  Location: [place];  Service: Gastroenterology;  Laterality: N/A;  . LAPAROTOMY N/A 08/31/2017   Procedure: EXPLORATORY LAPAROTOMY for SBO, ileocolectomy, removal of piece of uterine wall;  Surgeon: [name], MD;  Location: [facility] ORS;  Service: General;  Laterality: N/A;  . LASER ABLATION CONDOLAMATA N/A 02/22/2018   Procedure: LASER ABLATION/REMOVAL OF LOVFIEPPIRJ AROUND ANUS AND VAGINA;  Surgeon: Michael Boston, MD;  Location: [place];  Service: General;  Laterality: N/A;  . OOPHORECTOMY    . SMALL INTESTINE SURGERY    . TANDEM RING INSERTION     x3  . THORACOTOMY    . TOTAL KNEE ARTHROPLASTY Left 04/24/2018   Procedure: TOTAL KNEE ARTHROPLASTY;  Surgeon: Lovell Sheehan, MD;  Location: [facility] ORS;  Service: Orthopedics;  Laterality: Left;    Allergies: Ketamine  Medications: Prior to Admission medications   Medication Sig Start Date End Date Taking? Authorizing Provider  cholestyramine (QUESTRAN) 4 g packet Take 1 packet (4 g total) by mouth 3 (three) times daily. Patient taking differently: Take 4 g by mouth daily.  02/11/18 05/12/18 Yes Vanga, Tally Due, MD  dicyclomine (BENTYL) 10 MG capsule Take 1 capsule (10 mg total) by mouth 4 (four) times daily -  before meals and at bedtime. 02/11/18 08/10/18 Yes Vanga, Tally Due, MD  HYDROmorphone (DILAUDID) 4 MG tablet Take 4 mg by mouth every 4 (four) hours as needed for severe pain.   Yes [provider]  loperamide (IMODIUM A-D) 2 MG tablet Take 2 mg by mouth 4 (four) times daily as needed for diarrhea or loose stools.   Yes [provider]  loratadine (CLARITIN) 10 MG tablet Take 10 mg by mouth daily.   Yes [provider]  sertraline (ZOLOFT) 50 MG tablet Take 1  tablet (50 mg total) by mouth daily. 04/02/18  Yes Lada, Satira Anis, MD  VENTOLIN HFA 108 (90 Base) MCG/ACT inhaler INHALE 1-2 PUFFS BY MOUTH EVERY 4 HOURS AS NEEDED FOR WHEEZING / SHORTNESS OF BREATH 08/02/17  Yes [provider]  ALOE VERA CONCENTRATE PO Take 1 tablet by mouth daily.     [provider]  Biotin 1000 MCG tablet Take 1,000 mcg by mouth daily.    [provider]  cyanocobalamin (,VITAMIN B-12,) 1000 MCG/ML injection Inject 1ML every week x 4 weeks, then 1ML every other week x 2 months, then monthly x 3 months Patient taking differently: Inject 1,000 mcg into the muscle every 14 (fourteen) days.  11/29/17   [name], MD  Digestive Enzymes (ENZYME DIGEST PO) Take 1 tablet by mouth daily.     [provider]  fluticasone (FLONASE) 50 MCG/ACT nasal spray Place 2 sprays into both nostrils daily. Patient taking differently: Place 2 sprays into both nostrils daily as needed for allergies.  02/11/18   Steele Sizer, MD  hydrocortisone (ANUSOL-HC) 2.5 % rectal cream Place 1 application rectally 2 (two) times daily. Patient taking differently: Place 1 application rectally 2 (two) times daily as needed (for rectal discomfort).  11/15/17   [name], MD  Multiple Vitamin (MULTIVITAMIN WITH MINERALS) TABS tablet Take 1 tablet daily by mouth.    [provider]  oxyCODONE 10 MG TABS Take 1 tablet (10 mg total) by mouth every 4 (four) hours as needed for severe pain (pain score 7-10). Patient not taking: Reported on 05/06/2018 04/27/18   Lovell Sheehan, MD  Probiotic Product [facility] DAILY DF PO) Take 1 tablet by mouth daily.     [provider]  rivaroxaban (XARELTO) 20 MG TABS tablet Take 1 tablet (20 mg total) by mouth daily with supper. 03/13/18   Lequita Asal, MD  Syringe/Needle, Disp, (SYRINGE 3CC/22GX1") 22G X 1" 3 ML MISC For use with Vitamin B12 injections 04/12/18   Vanga, Tally Due, MD  Vitamin D, Ergocalciferol,  (DRISDOL) 50000 units CAPS capsule Take 50,000 Units by mouth every Thursday.    [provider]     Family History  Problem Relation Age of Onset  . Hypertension Father   . Diabetes Father   . Alcohol abuse Daughter   . Hypertension Maternal Grandmother   . Diabetes Maternal Grandmother   . Diabetes Paternal Grandmother   . Hypertension Paternal Grandmother     Social History   Socioeconomic History  . Marital status: Legally Separated    Spouse name: Not on file  . Number of children: Not on file  . Years of education: Not on file  . Highest education level: Not on file  Occupational History  . Not on file  Social Needs  . Financial resource strain: Not on file  . Food insecurity:    Worry: Not on file    Inability: Not on file  .  Transportation needs:    Medical: Not on file    Non-medical: Not on file  Tobacco Use  . Smoking status: Former Smoker    Last attempt to quit: 10/16/2006    Years since quitting: 11.5  . Smokeless tobacco: Never Used  Substance and Sexual Activity  . Alcohol use: Yes    Frequency: Never    Comment: seldom  . Drug use: Yes    Types: Marijuana  . Sexual activity: Not Currently    Birth control/protection: Post-menopausal    Comment: Not Asked  Lifestyle  . Physical activity:    Days per week: Not on file    Minutes per session: Not on file  . Stress: Not on file  Relationships  . Social connections:    Talks on phone: Not on file    Gets together: Not on file    Attends religious service: Not on file    Active member of club or organization: Not on file    Attends meetings of clubs or organizations: Not on file    Relationship status: Not on file  Other Topics Concern  . Not on file  Social History Narrative  . Not on file    ECOG Status: 2 - Symptomatic, <50% confined to bed  Review of Systems: A 12 point ROS discussed and pertinent positives are indicated in the HPI above.  All other systems are  negative.  Review of Systems  Constitutional: Positive for activity change. Negative for appetite change, fatigue and fever.  Respiratory: Negative.   Cardiovascular: Negative.   Gastrointestinal: Negative.   Musculoskeletal: Positive for gait problem and joint swelling.       Knee pain associated with recent total knee replacement.    Vital Signs: BP 108/60   Pulse 74   Temp 98.2 F (36.8 C) (Oral)   Resp 17   SpO2 100%   Physical Exam  Constitutional: She appears well-developed and well-nourished.  HENT:  Head: Normocephalic and atraumatic.  Cardiovascular: Normal rate and regular rhythm.  Pulmonary/Chest: Effort normal and breath sounds normal.  Psychiatric: She has a normal mood and affect. Her behavior is normal.  Nursing note and vitals reviewed.   Imaging: Ct Chest W Contrast  Result Date: 04/15/2018 CLINICAL DATA:  Urinary frequency for several days. Cervical cancer restaging. Lung nodules. EXAM: CT CHEST, ABDOMEN, AND PELVIS WITH CONTRAST TECHNIQUE: Multidetector CT imaging of the chest, abdomen and pelvis was performed following the standard protocol during bolus administration of intravenous contrast. CONTRAST:  190m ISOVUE-300 IOPAMIDOL (ISOVUE-300) INJECTION 61% COMPARISON:  Multiple exams, including PET-CT from 11/08/2017 FINDINGS: CT CHEST FINDINGS Cardiovascular: Aortic arch atherosclerotic calcification. Mediastinum/Nodes: Unremarkable Lungs/Pleura: Numerous scattered enlarging pulmonary nodules are present. One index nodule in the right lower lobe on image 79/4 measures 1.0 by 1.1 cm, previously 0.6 by 0.6 cm. Many of the nodules demonstrate increased cavitation, and most of the nodules are cavitary. The vast majority of the nodules appear to have been present previously. Over 100 pulmonary nodules are present. Musculoskeletal: Unremarkable CT ABDOMEN PELVIS FINDINGS Hepatobiliary: Cholecystectomy. Common bile duct 1.1 cm in diameter, stable. Pancreas: Unremarkable  Spleen: Unremarkable Adrenals/Urinary Tract: Unremarkable Stomach/Bowel: Ill-defined wall thickening in the rectosigmoid as before, with surrounding perirectal edema and edema tracking along perirectal fascia planes. Right hemicolectomy. Vascular/Lymphatic: Aortoiliac atherosclerotic vascular disease. Portacaval node 1.1 cm, roughly stable. Small periaortic lymph nodes are present. Reproductive: Heterogeneous enhancement in the uterus. Other: No supplemental non-categorized findings. Musculoskeletal: Lumbar spondylosis and degenerative disc disease noted with suspected  left impingement at L1-2 and L5-S1, and suspected right impingement at L3-4, L4-5, and L5-S1. IMPRESSION: 1. Innumerable cavitary nodules scattered in the lungs, moderately enlarging compared to the 11/08/2017 PET-CT, suspicious for metastatic disease. I do not see any definite new nodules. 2. Ill-defined wall thickening in the rectosigmoid with surrounding stranding along fascia planes, probably sequela from prior radiation therapy. 3. Multilevel lumbar impingement due to spondylosis and degenerative disc disease. 4.  Aortic Atherosclerosis (ICD10-I70.0). 5. Heterogeneous enhancement in the uterus. Some of this may be from prior radiation therapy. Electronically Signed   By: Van Clines M.D.   On: 04/15/2018 12:25   Ct Abdomen Pelvis W Contrast  Result Date: 04/15/2018 CLINICAL DATA:  Urinary frequency for several days. Cervical cancer restaging. Lung nodules. EXAM: CT CHEST, ABDOMEN, AND PELVIS WITH CONTRAST TECHNIQUE: Multidetector CT imaging of the chest, abdomen and pelvis was performed following the standard protocol during bolus administration of intravenous contrast. CONTRAST:  120m ISOVUE-300 IOPAMIDOL (ISOVUE-300) INJECTION 61% COMPARISON:  Multiple exams, including PET-CT from 11/08/2017 FINDINGS: CT CHEST FINDINGS Cardiovascular: Aortic arch atherosclerotic calcification. Mediastinum/Nodes: Unremarkable Lungs/Pleura: Numerous  scattered enlarging pulmonary nodules are present. One index nodule in the right lower lobe on image 79/4 measures 1.0 by 1.1 cm, previously 0.6 by 0.6 cm. Many of the nodules demonstrate increased cavitation, and most of the nodules are cavitary. The vast majority of the nodules appear to have been present previously. Over 100 pulmonary nodules are present. Musculoskeletal: Unremarkable CT ABDOMEN PELVIS FINDINGS Hepatobiliary: Cholecystectomy. Common bile duct 1.1 cm in diameter, stable. Pancreas: Unremarkable Spleen: Unremarkable Adrenals/Urinary Tract: Unremarkable Stomach/Bowel: Ill-defined wall thickening in the rectosigmoid as before, with surrounding perirectal edema and edema tracking along perirectal fascia planes. Right hemicolectomy. Vascular/Lymphatic: Aortoiliac atherosclerotic vascular disease. Portacaval node 1.1 cm, roughly stable. Small periaortic lymph nodes are present. Reproductive: Heterogeneous enhancement in the uterus. Other: No supplemental non-categorized findings. Musculoskeletal: Lumbar spondylosis and degenerative disc disease noted with suspected left impingement at L1-2 and L5-S1, and suspected right impingement at L3-4, L4-5, and L5-S1. IMPRESSION: 1. Innumerable cavitary nodules scattered in the lungs, moderately enlarging compared to the 11/08/2017 PET-CT, suspicious for metastatic disease. I do not see any definite new nodules. 2. Ill-defined wall thickening in the rectosigmoid with surrounding stranding along fascia planes, probably sequela from prior radiation therapy. 3. Multilevel lumbar impingement due to spondylosis and degenerative disc disease. 4.  Aortic Atherosclerosis (ICD10-I70.0). 5. Heterogeneous enhancement in the uterus. Some of this may be from prior radiation therapy. Electronically Signed   By: WVan ClinesM.D.   On: 04/15/2018 12:25   Nm Pet Image Initial (pi) Skull Base To Thigh  Result Date: 04/23/2018 CLINICAL DATA:  Subsequent treatment strategy  for cervical cancer. Multiple pulmonary nodules. EXAM: NUCLEAR MEDICINE PET SKULL BASE TO THIGH TECHNIQUE: 8.3 mCi F-18 FDG was injected intravenously. Full-ring PET imaging was performed from the skull base to thigh after the radiotracer. CT data was obtained and used for attenuation correction and anatomic localization. Fasting blood glucose: 80 mg/dl COMPARISON:  Multiple exams, including CT scan of 04/15/2018 and outside PET-CT from [facility] 11/08/2017 FINDINGS: Mediastinal blood pool activity: SUV max 2.5 NECK: No significant abnormal hypermetabolic activity in this region. Incidental CT findings: Mild chronic right maxillary sinusitis. CHEST: Roughly similar appearance of innumerable solid and cavitary nodules scattered in both lungs. When I compare back to the prior PET-CT from 11/08/2017 these nodules have clearly increased in size and have also had some increase in activity, although  hypermetabolic activity remains relatively low-grade. For example, an index nodule in the left upper lobe on image 83/4 appears solid and measures 1.1 by 0.9 cm, previously 0.6 by 0.7 cm, with maximum standard uptake value 1.9, formerly 0.9. A 7 mm nodule in the right lower lobe has a maximum SUV of 2.3. Other nodules likewise have low-grade metabolic activity. Most of the nodules or at or below 1 cm in diameter. No enlarged or hypermetabolic lymph nodes in the chest. Incidental CT findings: Aortic arch atherosclerotic vascular disease. ABDOMEN/PELVIS: Physiologic accentuation of activity in segments of bowel. This primarily involves the colon. No appreciable uterine hypermetabolic activity. Upper normal sized porta hepatis lymph nodes are not hypermetabolic. No pathologic retroperitoneal or pelvic adenopathy. Incidental CT findings: Cholecystectomy. Aortoiliac atherosclerotic vascular disease. Postoperative findings in the right colon. Presacral stranding, likely due to prior radiation  therapy. SKELETON: No significant abnormal hypermetabolic activity in this region. Incidental CT findings: Dental cavities noted. IMPRESSION: 1. The numerous scattered solid and cavitary nodules in the lungs are essentially stable from 04/15/2018 but have increased in size compared to the prior PET-CT from 11/08/2017. These demonstrate low-grade metabolic activity up to a maximum SUV of about 2.3, which is also increased from 11/08/2017. Appearance favors cavitary malignancy over infectious processes such as fungal disease. 2. Post therapy related findings in the pelvis. 3. Other imaging findings of potential clinical significance: Mild chronic right maxillary sinusitis. Aortic Atherosclerosis (ICD10-I70.0). Dental cavities. Electronically Signed   By: Van Clines M.D.   On: 04/23/2018 11:55   US Venous Img Lower Unilateral Right  Result Date: 04/17/2018 CLINICAL DATA:  Pain x2 weeks EXAM: RIGHT LOWER EXTREMITY VENOUS DOPPLER ULTRASOUND TECHNIQUE: Gray-scale sonography with compression, as well as color and duplex ultrasound, were performed to evaluate the deep venous system from the level of the common femoral vein through the popliteal and proximal calf veins. COMPARISON:  None FINDINGS: Normal compressibility of the common femoral, superficial femoral, and popliteal veins, as well as the proximal calf veins. No filling defects to suggest DVT on grayscale or color Doppler imaging. Doppler waveforms show normal direction of venous flow, normal respiratory phasicity and response to augmentation. Survey views of the contralateral common femoral vein are unremarkable. IMPRESSION: No evidence of right lower extremity deep vein thrombosis. Electronically Signed   By: Lucrezia Europe M.D.   On: 04/17/2018 14:30   Dg Knee Left Port  Result Date: 04/24/2018 CLINICAL DATA:  Status post left total knee joint prosthesis placement. EXAM: PORTABLE LEFT KNEE - 1-2 VIEW COMPARISON:  MRI of the left knee of Feb 26, 2018  FINDINGS: The patient has undergone total knee joint prosthesis placement. Radiographic positioning of the prosthetic components is good. The interface with the native bone appears normal. There is no acute native bone fracture. Surgical skin staples are present. IMPRESSION: No immediate postprocedure complication following left total knee joint prosthesis placement. Electronically Signed   By: David  Martinique M.D.   On: 04/24/2018 12:54    Labs:  CBC: Recent Labs    04/25/18 0419 04/26/18 0332 04/27/18 0354 05/06/18 0908  WBC 2.8* 4.0 3.9 3.5*  HGB 9.7* 10.7* 10.7* 11.6*  HCT 27.9* 30.5* 30.0* 33.4*  PLT 106* 137* 161 294    COAGS: Recent Labs    12/03/17 1701  12/05/17 0648 12/05/17 1319 04/10/18 0911 04/24/18 0828 05/06/18 0908  INR 1.05  --   --   --  2.29 0.88 0.93  APTT 28   < > 120* 100* 40* 25  --    < > =  values in this interval not displayed.    BMP: Recent Labs    12/21/17 1044 03/13/18 0955 04/10/18 0911 04/25/18 0419  NA 137 135 137 138  K 3.8 3.5 4.2 4.1  CL 105 103 106 108  CO2 24 24 24 25   GLUCOSE 62* 114* 79 89  BUN 17 13 17 14   CALCIUM 9.0 8.9 8.8* 8.3*  CREATININE 0.83 0.68 0.68 0.68  GFRNONAA >60 >60 >60 >60  GFRAA >60 >60 >60 >60    LIVER FUNCTION TESTS: Recent Labs    11/01/17 1428 12/03/17 0941 12/21/17 1044 03/13/18 0955  BILITOT 0.6 0.6 0.4 0.5  AST 57* 42* 46* 36  ALT 56* 45 87* 38  ALKPHOS 227* 202* 167* 147*  PROT 8.1 9.3* 8.1 7.7  ALBUMIN 3.9 4.5 3.9 3.9    TUMOR MARKERS: No results for input(s): AFPTM, CEA, CA199, CHROMGRNA in the last 8760 hours.  Assessment and Plan:  AHLIYAH NIENOW is a 52 y.o. female with past medical history significant for factor V Leiden mutation, pulmonary embolism, aortic atherosclerosis, hepatitis C, and most significantly, cervical cancer, who presents today for CT-guided pulmonary nodule biopsy.    Patient reports moderate to severe knee pain associated with recent total knee  replacement.  She is otherwise without complaint.    Preprocedural imaging was reviewed.  Given patient's recent total knee replacement, I will attempt to proceed with CT-guided biopsy of dominant solid-appearing left upper lobe pulmonary nodule to allow the patient to simply lie supine on the fluoroscopy table as various obliquities may be uncomfortable for the patient given her recent postoperative state.  Risks and benefits CT-guided pulmonary nodule biopsy was discussed with the patient including, but not limited to bleeding, hemoptysis, respiratory failure requiring intubation, infection, pneumothorax requiring chest tube placement, stroke from air embolism or even death.  All of the patient's questions were answered, patient is agreeable to proceed.  Consent signed and in chart.   Thank you for this interesting consult.  I greatly enjoyed meeting Joanna Hall and look forward to participating in their care.  A copy of this report was sent to the requesting provider on this date.  Electronically Signed: Sandi Mariscal, MD 05/06/2018, 11:24 AM   I spent a total of 15 Minutes in face to face in clinical consultation, greater than 50% of which was counseling/coordinating care for CT guided lung nodule Bx.

## 2018-05-06 NOTE — Procedures (Signed)
Pre procedural Dx: Pulmonary nodules  Post procedural Dx: Same  Technically successful CT guided biopsy of indeterminate left upper lobe pulmonary nodule.   EBL: None.   Complications: None immediate.   Ronny Bacon, MD Pager #: [phone]

## 2018-05-08 ENCOUNTER — Ambulatory Visit: Payer: Medicaid Other | Admitting: Urgent Care

## 2018-05-08 ENCOUNTER — Inpatient Hospital Stay (HOSPITAL_BASED_OUTPATIENT_CLINIC_OR_DEPARTMENT_OTHER): Payer: Medicaid Other | Admitting: Hematology and Oncology

## 2018-05-08 VITALS — BP 125/81 | HR 85 | Temp 97.7°F | Resp 18 | Wt 165.6 lb

## 2018-05-08 DIAGNOSIS — Z9221 Personal history of antineoplastic chemotherapy: Secondary | ICD-10-CM

## 2018-05-08 DIAGNOSIS — C7802 Secondary malignant neoplasm of left lung: Secondary | ICD-10-CM

## 2018-05-08 DIAGNOSIS — C539 Malignant neoplasm of cervix uteri, unspecified: Secondary | ICD-10-CM

## 2018-05-08 DIAGNOSIS — Z7189 Other specified counseling: Secondary | ICD-10-CM

## 2018-05-08 DIAGNOSIS — Z7901 Long term (current) use of anticoagulants: Secondary | ICD-10-CM

## 2018-05-08 DIAGNOSIS — E538 Deficiency of other specified B group vitamins: Secondary | ICD-10-CM

## 2018-05-08 DIAGNOSIS — B182 Chronic viral hepatitis C: Secondary | ICD-10-CM | POA: Diagnosis not present

## 2018-05-08 DIAGNOSIS — Z923 Personal history of irradiation: Secondary | ICD-10-CM | POA: Diagnosis not present

## 2018-05-08 DIAGNOSIS — D6851 Activated protein C resistance: Secondary | ICD-10-CM

## 2018-05-08 DIAGNOSIS — C7800 Secondary malignant neoplasm of unspecified lung: Secondary | ICD-10-CM

## 2018-05-08 LAB — SURGICAL PATHOLOGY

## 2018-05-08 NOTE — Progress Notes (Signed)
Patient states she is continuing to have night sweats.  She woke up 4 times one night this week and states her bed was 'soaking wet".  She recently had left nee replacement and is having PT.

## 2018-05-08 NOTE — Patient Instructions (Addendum)
2 regimens being considered 1. Carboplatin + paclitaxel (PREFERRED) 2. Topotecan + Bevacizumab   Carboplatin injection What is this medicine? CARBOPLATIN (KAR boe pla tin) is a chemotherapy drug. It targets fast dividing cells, like cancer cells, and causes these cells to die. This medicine is used to treat ovarian cancer and many other cancers. This medicine may be used for other purposes; ask your health care provider or pharmacist if you have questions. COMMON BRAND NAME(S): Paraplatin What should I tell my health care provider before I take this medicine? They need to know if you have any of these conditions: -blood disorders -hearing problems -kidney disease -recent or ongoing radiation therapy -an unusual or allergic reaction to carboplatin, cisplatin, other chemotherapy, other medicines, foods, dyes, or preservatives -pregnant or trying to get pregnant -breast-feeding How should I use this medicine? This drug is usually given as an infusion into a vein. It is administered in a hospital or clinic by a specially trained health care professional. Talk to your pediatrician regarding the use of this medicine in children. Special care may be needed. Overdosage: If you think you have taken too much of this medicine contact a poison control center or emergency room at once. NOTE: This medicine is only for you. Do not share this medicine with others. What if I miss a dose? It is important not to miss a dose. Call your doctor or health care professional if you are unable to keep an appointment. What may interact with this medicine? -medicines for seizures -medicines to increase blood counts like filgrastim, pegfilgrastim, sargramostim -some antibiotics like amikacin, gentamicin, neomycin, streptomycin, tobramycin -vaccines Talk to your doctor or health care professional before taking any of these medicines: -acetaminophen -aspirin -ibuprofen -ketoprofen -naproxen This list may not  describe all possible interactions. Give your health care provider a list of all the medicines, herbs, non-prescription drugs, or dietary supplements you use. Also tell them if you smoke, drink alcohol, or use illegal drugs. Some items may interact with your medicine. What should I watch for while using this medicine? Your condition will be monitored carefully while you are receiving this medicine. You will need important blood work done while you are taking this medicine. This drug may make you feel generally unwell. This is not uncommon, as chemotherapy can affect healthy cells as well as cancer cells. Report any side effects. Continue your course of treatment even though you feel ill unless your doctor tells you to stop. In some cases, you may be given additional medicines to help with side effects. Follow all directions for their use. Call your doctor or health care professional for advice if you get a fever, chills or sore throat, or other symptoms of a cold or flu. Do not treat yourself. This drug decreases your body's ability to fight infections. Try to avoid being around people who are sick. This medicine may increase your risk to bruise or bleed. Call your doctor or health care professional if you notice any unusual bleeding. Be careful brushing and flossing your teeth or using a toothpick because you may get an infection or bleed more easily. If you have any dental work done, tell your dentist you are receiving this medicine. Avoid taking products that contain aspirin, acetaminophen, ibuprofen, naproxen, or ketoprofen unless instructed by your doctor. These medicines may hide a fever. Do not become pregnant while taking this medicine. Women should inform their doctor if they wish to become pregnant or think they might be pregnant. There is a potential  for serious side effects to an unborn child. Talk to your health care professional or pharmacist for more information. Do not breast-feed an infant  while taking this medicine. What side effects may I notice from receiving this medicine? Side effects that you should report to your doctor or health care professional as soon as possible: -allergic reactions like skin rash, itching or hives, swelling of the face, lips, or tongue -signs of infection - fever or chills, cough, sore throat, pain or difficulty passing urine -signs of decreased platelets or bleeding - bruising, pinpoint red spots on the skin, black, tarry stools, nosebleeds -signs of decreased red blood cells - unusually weak or tired, fainting spells, lightheadedness -breathing problems -changes in hearing -changes in vision -chest pain -high blood pressure -low blood counts - This drug may decrease the number of white blood cells, red blood cells and platelets. You may be at increased risk for infections and bleeding. -nausea and vomiting -pain, swelling, redness or irritation at the injection site -pain, tingling, numbness in the hands or feet -problems with balance, talking, walking -trouble passing urine or change in the amount of urine Side effects that usually do not require medical attention (report to your doctor or health care professional if they continue or are bothersome): -hair loss -loss of appetite -metallic taste in the mouth or changes in taste This list may not describe all possible side effects. Call your doctor for medical advice about side effects. You may report side effects to FDA at 1-800-FDA-1088. Where should I keep my medicine? This drug is given in a hospital or clinic and will not be stored at home. NOTE: This sheet is a summary. It may not cover all possible information. If you have questions about this medicine, talk to your doctor, pharmacist, or health care provider.  2018 [employer] Standard (2008-01-07 14:38:05) Paclitaxel injection What is this medicine? PACLITAXEL (PAK li TAX el) is a chemotherapy drug. It targets fast dividing cells,  like cancer cells, and causes these cells to die. This medicine is used to treat ovarian cancer, breast cancer, and other cancers. This medicine may be used for other purposes; ask your health care provider or pharmacist if you have questions. COMMON BRAND NAME(S): Onxol, Taxol What should I tell my health care provider before I take this medicine? They need to know if you have any of these conditions: -blood disorders -irregular heartbeat -infection (especially a virus infection such as chickenpox, cold sores, or herpes) -liver disease -previous or ongoing radiation therapy -an unusual or allergic reaction to paclitaxel, alcohol, polyoxyethylated castor oil, other chemotherapy agents, other medicines, foods, dyes, or preservatives -pregnant or trying to get pregnant -breast-feeding How should I use this medicine? This drug is given as an infusion into a vein. It is administered in a hospital or clinic by a specially trained health care professional. Talk to your pediatrician regarding the use of this medicine in children. Special care may be needed. Overdosage: If you think you have taken too much of this medicine contact a poison control center or emergency room at once. NOTE: This medicine is only for you. Do not share this medicine with others. What if I miss a dose? It is important not to miss your dose. Call your doctor or health care professional if you are unable to keep an appointment. What may interact with this medicine? Do not take this medicine with any of the following medications: -disulfiram -metronidazole This medicine may also interact with the following medications: -cyclosporine -diazepam -  ketoconazole -medicines to increase blood counts like filgrastim, pegfilgrastim, sargramostim -other chemotherapy drugs like cisplatin, doxorubicin, epirubicin, etoposide, teniposide, vincristine -quinidine -testosterone -vaccines -verapamil Talk to your doctor or health care  professional before taking any of these medicines: -acetaminophen -aspirin -ibuprofen -ketoprofen -naproxen This list may not describe all possible interactions. Give your health care provider a list of all the medicines, herbs, non-prescription drugs, or dietary supplements you use. Also tell them if you smoke, drink alcohol, or use illegal drugs. Some items may interact with your medicine. What should I watch for while using this medicine? Your condition will be monitored carefully while you are receiving this medicine. You will need important blood work done while you are taking this medicine. This medicine can cause serious allergic reactions. To reduce your risk you will need to take other medicine(s) before treatment with this medicine. If you experience allergic reactions like skin rash, itching or hives, swelling of the face, lips, or tongue, tell your doctor or health care professional right away. In some cases, you may be given additional medicines to help with side effects. Follow all directions for their use. This drug may make you feel generally unwell. This is not uncommon, as chemotherapy can affect healthy cells as well as cancer cells. Report any side effects. Continue your course of treatment even though you feel ill unless your doctor tells you to stop. Call your doctor or health care professional for advice if you get a fever, chills or sore throat, or other symptoms of a cold or flu. Do not treat yourself. This drug decreases your body's ability to fight infections. Try to avoid being around people who are sick. This medicine may increase your risk to bruise or bleed. Call your doctor or health care professional if you notice any unusual bleeding. Be careful brushing and flossing your teeth or using a toothpick because you may get an infection or bleed more easily. If you have any dental work done, tell your dentist you are receiving this medicine. Avoid taking products that  contain aspirin, acetaminophen, ibuprofen, naproxen, or ketoprofen unless instructed by your doctor. These medicines may hide a fever. Do not become pregnant while taking this medicine. Women should inform their doctor if they wish to become pregnant or think they might be pregnant. There is a potential for serious side effects to an unborn child. Talk to your health care professional or pharmacist for more information. Do not breast-feed an infant while taking this medicine. Men are advised not to father a child while receiving this medicine. This product may contain alcohol. Ask your pharmacist or healthcare provider if this medicine contains alcohol. Be sure to tell all healthcare providers you are taking this medicine. Certain medicines, like metronidazole and disulfiram, can cause an unpleasant reaction when taken with alcohol. The reaction includes flushing, headache, nausea, vomiting, sweating, and increased thirst. The reaction can last from 30 minutes to several hours. What side effects may I notice from receiving this medicine? Side effects that you should report to your doctor or health care professional as soon as possible: -allergic reactions like skin rash, itching or hives, swelling of the face, lips, or tongue -low blood counts - This drug may decrease the number of white blood cells, red blood cells and platelets. You may be at increased risk for infections and bleeding. -signs of infection - fever or chills, cough, sore throat, pain or difficulty passing urine -signs of decreased platelets or bleeding - bruising, pinpoint red spots on the  skin, black, tarry stools, nosebleeds -signs of decreased red blood cells - unusually weak or tired, fainting spells, lightheadedness -breathing problems -chest pain -high or low blood pressure -mouth sores -nausea and vomiting -pain, swelling, redness or irritation at the injection site -pain, tingling, numbness in the hands or feet -slow or  irregular heartbeat -swelling of the ankle, feet, hands Side effects that usually do not require medical attention (report to your doctor or health care professional if they continue or are bothersome): -bone pain -complete hair loss including hair on your head, underarms, pubic hair, eyebrows, and eyelashes -changes in the color of fingernails -diarrhea -loosening of the fingernails -loss of appetite -muscle or joint pain -red flush to skin -sweating This list may not describe all possible side effects. Call your doctor for medical advice about side effects. You may report side effects to FDA at 1-800-FDA-1088. Where should I keep my medicine? This drug is given in a hospital or clinic and will not be stored at home. NOTE: This sheet is a summary. It may not cover all possible information. If you have questions about this medicine, talk to your doctor, pharmacist, or health care provider.  2018 [employer] Standard (2015-08-03 19:58:00) Bevacizumab injection What is this medicine? BEVACIZUMAB (be va SIZ yoo mab) is a monoclonal antibody. It is used to treat many types of cancer. This medicine may be used for other purposes; ask your health care provider or pharmacist if you have questions. COMMON BRAND NAME(S): Avastin What should I tell my health care provider before I take this medicine? They need to know if you have any of these conditions: -diabetes -heart disease -high blood pressure -history of coughing up blood -prior anthracycline chemotherapy (e.g., doxorubicin, daunorubicin, epirubicin) -recent or ongoing radiation therapy -recent or planning to have surgery -stroke -an unusual or allergic reaction to bevacizumab, hamster proteins, mouse proteins, other medicines, foods, dyes, or preservatives -pregnant or trying to get pregnant -breast-feeding How should I use this medicine? This medicine is for infusion into a vein. It is given by a health care professional in a  hospital or clinic setting. Talk to your pediatrician regarding the use of this medicine in children. Special care may be needed. Overdosage: If you think you have taken too much of this medicine contact a poison control center or emergency room at once. NOTE: This medicine is only for you. Do not share this medicine with others. What if I miss a dose? It is important not to miss your dose. Call your doctor or health care professional if you are unable to keep an appointment. What may interact with this medicine? Interactions are not expected. This list may not describe all possible interactions. Give your health care provider a list of all the medicines, herbs, non-prescription drugs, or dietary supplements you use. Also tell them if you smoke, drink alcohol, or use illegal drugs. Some items may interact with your medicine. What should I watch for while using this medicine? Your condition will be monitored carefully while you are receiving this medicine. You will need important blood work and urine testing done while you are taking this medicine. This medicine may increase your risk to bruise or bleed. Call your doctor or health care professional if you notice any unusual bleeding. This medicine should be started at least 28 days following major surgery and the site of the surgery should be totally healed. Check with your doctor before scheduling dental work or surgery while you are receiving this treatment. Talk to  your doctor if you have recently had surgery or if you have a wound that has not healed. Do not become pregnant while taking this medicine or for 6 months after stopping it. Women should inform their doctor if they wish to become pregnant or think they might be pregnant. There is a potential for serious side effects to an unborn child. Talk to your health care professional or pharmacist for more information. Do not breast-feed an infant while taking this medicine and for 6 months after the  last dose. This medicine has caused ovarian failure in some women. This medicine may interfere with the ability to have a child. You should talk to your doctor or health care professional if you are concerned about your fertility. What side effects may I notice from receiving this medicine? Side effects that you should report to your doctor or health care professional as soon as possible: -allergic reactions like skin rash, itching or hives, swelling of the face, lips, or tongue -chest pain or chest tightness -chills -coughing up blood -high fever -seizures -severe constipation -signs and symptoms of bleeding such as bloody or black, tarry stools; red or dark-brown urine; spitting up blood or brown material that looks like coffee grounds; red spots on the skin; unusual bruising or bleeding from the eye, gums, or nose -signs and symptoms of a blood clot such as breathing problems; chest pain; severe, sudden headache; pain, swelling, warmth in the leg -signs and symptoms of a stroke like changes in vision; confusion; trouble speaking or understanding; severe headaches; sudden numbness or weakness of the face, arm or leg; trouble walking; dizziness; loss of balance or coordination -stomach pain -sweating -swelling of legs or ankles -vomiting -weight gain Side effects that usually do not require medical attention (report to your doctor or health care professional if they continue or are bothersome): -back pain -changes in taste -decreased appetite -dry skin -nausea -tiredness This list may not describe all possible side effects. Call your doctor for medical advice about side effects. You may report side effects to FDA at 1-800-FDA-1088. Where should I keep my medicine? This drug is given in a hospital or clinic and will not be stored at home. NOTE: This sheet is a summary. It may not cover all possible information. If you have questions about this medicine, talk to your doctor, pharmacist,  or health care provider.  2018 [employer] Standard (2016-09-29 14:33:29) Topotecan injection What is this medicine? TOPOTECAN (TOE poe TEE kan) is a chemotherapy drug. It is used to treat lung cancer, ovarian cancer, and cervical cancer. This medicine may be used for other purposes; ask your health care provider or pharmacist if you have questions. COMMON BRAND NAME(S): Hycamtin What should I tell my health care provider before I take this medicine? They need to know if you have any of these conditions: -blood disorders -dehydration -diarrhea -immune system problems -infection (especially a virus infection such as chickenpox, cold sores, or herpes) -kidney disease -low blood counts, like low white cell, platelet, or red cell counts -recent or ongoing radiation therapy -an unusual or allergic reaction to topotecan, other medicines, foods, dyes, or preservatives -pregnant or trying to get pregnant -breast-feeding How should I use this medicine? This medicine is for infusion into a vein. It is usually given by a health care professional in a hospital or clinic setting. In rare cases, you might get this medicine at home. You will be taught how to give this medicine. Use exactly as directed. Take your medicine  at regular intervals. Do not take your medicine more often than directed. It is important that you put your used needles and syringes in a special sharps container. Do not put them in a trash can. If you do not have a sharps container, call your pharmacist or healthcare provider to get one. Talk to your pediatrician regarding the use of this medicine in children. Special care may be needed. Overdosage: If you think you have taken too much of this medicine contact a poison control center or emergency room at once. NOTE: This medicine is only for you. Do not share this medicine with others. What if I miss a dose? It is important not to miss your dose. Call your doctor or health care  professional if you are unable to keep an appointment. What may interact with this medicine? -amiodarone -azithromycin -captopril -carvedilol -certain medications for fungal infections like ketoconazole and itraconazole -clarithromycin -conivaptan -cyclosporine -diltiazem -dronedarone -eltrombopag -erythromycin -felodipine -grapefruit juice -lopinavir -quercetin -quinidine -ranolazine -ritonavir -ticagrelor -verapamil This list may not describe all possible interactions. Give your health care provider a list of all the medicines, herbs, non-prescription drugs, or dietary supplements you use. Also tell them if you smoke, drink alcohol, or use illegal drugs. Some items may interact with your medicine. What should I watch for while using this medicine? This drug may make you feel generally unwell. This is not uncommon, as chemotherapy can affect healthy cells as well as cancer cells. Report any side effects. Continue your course of treatment even though you feel ill unless your doctor tells you to stop. Call your doctor or health care professional for advice if you get a fever, chills or sore throat, or other symptoms of a cold or flu. Do not treat yourself. This drug decreases your body's ability to fight infections. Try to avoid being around people who are sick. This medicine may increase your risk to bruise or bleed. Call your doctor or health care professional if you notice any unusual bleeding. Be careful brushing and flossing your teeth or using a toothpick because you may get an infection or bleed more easily. If you have any dental work done, tell your dentist you are receiving this medicine. Avoid taking products that contain aspirin, acetaminophen, ibuprofen, naproxen, or ketoprofen unless instructed by your doctor. These medicines may hide a fever. Do not become pregnant while taking this medicine or within 1 month of stopping it. Women should inform their doctor if they wish to  become pregnant or think they might be pregnant. There is a potential for serious side effects to an unborn child. Talk to your health care professional or pharmacist for more information. Do not breast-feed an infant while taking this medicine. Men must use a latex condom during sexual contact with a woman while taking this medicine and for 3 months after you stop taking this medicine. A latex condom is needed even if you have had a vasectomy. Contact your doctor right away if your partner becomes pregnant. Do not donate sperm while taking this medicine and for 3 months after you stop taking this medicine. What side effects may I notice from receiving this medicine? Side effects that you should report to your doctor or health care professional as soon as possible: -allergic reactions like skin rash, itching or hives, swelling of the face, lips, or tongue -breathing difficulties -diarrhea -dizziness -fever or chills, sore throat -mouth sores or pain -pain, tingling, numbness in the hands or feet -unusual bleeding or bruising -unusually weak  or tired -yellowing of the eyes or skin Side effects that usually do not require medical attention (report to your doctor or health care professional if they continue or are bothersome): -hair loss -headache -loss of appetite -nausea, vomiting -stomach pain This list may not describe all possible side effects. Call your doctor for medical advice about side effects. You may report side effects to FDA at 1-800-FDA-1088. Where should I keep my medicine? Keep out of the reach of children. This drug is usually given in a hospital or clinic and will not be stored at home. In rare cases, this medicine may be given at home. If you are using this medicine at home, you will be instructed on how to store this medicine. Throw away any unused medicine after the expiration date on the label. NOTE: This sheet is a summary. It may not cover all possible information. If  you have questions about this medicine, talk to your doctor, pharmacist, or health care provider.  2018 [employer] Standard (2014-04-01 11:14:18)

## 2018-05-08 NOTE — Progress Notes (Signed)
[facility] day:  05/08/2018   Chief Complaint: Joanna Hall is a 52 y.o. female with a history of cervical cancer s/p chemoradiation who is seen for review of imaging, results of interval biopsy, and discussion regarding direction of therapy.  HPI:  The patient was last seen in the medical oncology clinic on 04/17/2018.  At that time, she was fatigued.  She had right thigh pain.  Sleep was poor.  She had chronic abdominal tenderness. Exam was stable.  She was scheduled for left knee replacement.  Interval CT scans revealed enlarging pulmonary nodules suspicious for metastatic cervical cancer. We discussed consideration of lung biopsy (peripheral lesion).    PET scan on 04/23/2018 revealed numerous scattered solid and cavitary nodules in the lungs stable increased in size compared to the prior PET-CT from 11/08/2017. Largest nodule was 1.1 cm in the LUL (SUV 1.9).  These demonstrated low-grade metabolic activity up to a maximum SUV of about 2.3, which also increased from 11/08/2017. Appearance favored cavitary malignancy over infectious processes such as fungal disease.  She as presented at tumor board on 04/25/2018. Tumor board consensus was to pursue CT guided biopsy.   She underwent left total knee replacement on 04/24/2018 by Dr. Kurtis Bushman.  Xarelto was held prior to the procedure (held x 5 days; restarted 04/25/2018). She tolerated the procedure well.  She underwent CT guided biopsy of a left upper lobe pulmonary nodule on 05/06/2018.  Pathology showed metastatic adenocarcinoma.   In the interim, patient is doing well following her surgery. She is ambulating with the use of a rolling walker. Patient had post-operative visit yesterday with Dr. [name] and had her skin closure staples removed. Present today with a clean and dry incision, with no evidence of infection. Steri-strips in place. She is participating in physical therapy twice a week.  Patient complains of pain rated 7/10 today.   Following her biopsy, patient denies increase shortness of breath. Patient denies that she has experienced any B symptoms. She denies any interval infections.   Patient advises that she maintains an adequate appetite. She is eating well. Weight today is 165 lb 9.1 oz (75.1 kg), which compared to her last visit to the clinic, represents a decrease of 5 pounds.    Past Medical History:  Diagnosis Date  . Anxiety   . Aortic atherosclerosis ([place])   . Arthritis    neck and knees  . 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 Blood transfusion without reported diagnosis   . Cervical cancer ([place])   . Chronic anal fissure   . Chronic diarrhea   . Dyspnea   . Factor V Leiden mutation ([place])   . Fecal incontinence   . Genital warts   . GERD (gastroesophageal reflux disease)   . Heart murmur   . Hemorrhoids   . 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 History of cancer chemotherapy    completed 06/2017  . History of Clostridium difficile infection    while undergoing chemo.  Negative test 10/2017  . 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 Macrocytic anemia with vitamin B12 deficiency   . Perianal condylomata   . Pneumonia    History of  . Pulmonary nodules   . Small bowel obstruction ([place]) 08/2017  . Stiff neck    limited right turn  . Vitamin D  deficiency     Past Surgical History:  Procedure Laterality Date  . CHOLECYSTECTOMY    . COLON SURGERY  08/2017   resection  . COLONOSCOPY WITH PROPOFOL N/A 12/20/2017   Procedure: COLONOSCOPY WITH PROPOFOL;  Surgeon: [name], MD;  Location: [facility] ENDOSCOPY;  Service: Gastroenterology;  Laterality: N/A;  . DIAGNOSTIC LAPAROSCOPY    . ESOPHAGOGASTRODUODENOSCOPY (EGD) WITH PROPOFOL N/A 12/20/2017   Procedure: ESOPHAGOGASTRODUODENOSCOPY (EGD) WITH PROPOFOL;  Surgeon: [name], MD;  Location: [facility];  Service:  Gastroenterology;  Laterality: N/A;  . LAPAROTOMY N/A 08/31/2017   Procedure: EXPLORATORY LAPAROTOMY for SBO, ileocolectomy, removal of piece of uterine wall;  Surgeon: [name], MD;  Location: [facility] ORS;  Service: General;  Laterality: N/A;  . LASER ABLATION CONDOLAMATA N/A 02/22/2018   Procedure: LASER ABLATION/REMOVAL OF GEXBMWUXLKG AROUND ANUS AND VAGINA;  Surgeon: Michael Boston, MD;  Location: [facility];  Service: General;  Laterality: N/A;  . OOPHORECTOMY    . SMALL INTESTINE SURGERY    . TANDEM RING INSERTION     x3  . THORACOTOMY    . TOTAL KNEE ARTHROPLASTY Left 04/24/2018   Procedure: TOTAL KNEE ARTHROPLASTY;  Surgeon: Lovell Sheehan, MD;  Location: [facility] ORS;  Service: Orthopedics;  Laterality: Left;    Family History  Problem Relation Age of Onset  . Hypertension Father   . Diabetes Father   . Alcohol abuse Daughter   . Hypertension Maternal Grandmother   . Diabetes Maternal Grandmother   . Diabetes Paternal Grandmother   . Hypertension Paternal Grandmother     Social History:  reports that she quit smoking about 11 years ago. She has never used smokeless tobacco. She reports that she drinks alcohol. She reports that she has current or past drug history. Drug: Marijuana.  The patient's 8 year old daughter died of alcoholism.  The patient is originally from [place].  She moved from [place] to [place]. She is alone today.  Allergies:  Allergies  Allergen Reactions  . Ketamine Anxiety and Other (See Comments)    Syncope episode/confusion     Current Medications: Current Outpatient Medications  Medication Sig Dispense Refill  . ALOE VERA CONCENTRATE PO Take 1 tablet by mouth daily.     . Biotin 1000 MCG tablet Take 1,000 mcg by mouth daily.    . cholestyramine (QUESTRAN) 4 g packet Take 1 packet (4 g total) by mouth 3 (three) times daily. (Patient taking differently: Take 4 g by mouth daily. ) 270 packet 0  . cyanocobalamin  (,VITAMIN B-12,) 1000 MCG/ML injection Inject 1ML every week x 4 weeks, then 1ML every other week x 2 months, then monthly x 3 months (Patient taking differently: Inject 1,000 mcg into the muscle every 14 (fourteen) days. ) 10 mL 1  . dicyclomine (BENTYL) 10 MG capsule Take 1 capsule (10 mg total) by mouth 4 (four) times daily -  before meals and at bedtime. 360 capsule 1  . Digestive Enzymes (ENZYME DIGEST PO) Take 1 tablet by mouth daily.     . fluticasone (FLONASE) 50 MCG/ACT nasal spray Place 2 sprays into both nostrils daily. (Patient taking differently: Place 2 sprays into both nostrils daily as needed for allergies. ) 16 g 0  . hydrocortisone (ANUSOL-HC) 2.5 % rectal cream Place 1 application rectally 2 (two) times daily. (Patient taking differently: Place 1 application rectally 2 (two) times daily as needed (for rectal discomfort). ) 30 g 0  . HYDROmorphone (DILAUDID) 4 MG  tablet Take 4 mg by mouth every 4 (four) hours as needed for severe pain.    Marland Kitchen loperamide (IMODIUM A-D) 2 MG tablet Take 2 mg by mouth 4 (four) times daily as needed for diarrhea or loose stools.    Marland Kitchen loratadine (CLARITIN) 10 MG tablet Take 10 mg by mouth daily.    . Multiple Vitamin (MULTIVITAMIN WITH MINERALS) TABS tablet Take 1 tablet daily by mouth.    . Probiotic Product (PROBIOMAX DAILY DF PO) Take 1 tablet by mouth daily.     . rivaroxaban (XARELTO) 20 MG TABS tablet Take 1 tablet (20 mg total) by mouth daily with supper. 30 tablet 1  . sertraline (ZOLOFT) 50 MG tablet Take 1 tablet (50 mg total) by mouth daily. 30 tablet 3  . Syringe/Needle, Disp, (SYRINGE 3CC/22GX1") 22G X 1" 3 ML MISC For use with Vitamin B12 injections 50 each 0  . VENTOLIN HFA 108 (90 Base) MCG/ACT inhaler INHALE 1-2 PUFFS BY MOUTH EVERY 4 HOURS AS NEEDED FOR WHEEZING / SHORTNESS OF BREATH  0  . Vitamin D, Ergocalciferol, (DRISDOL) 50000 units CAPS capsule Take 50,000 Units by mouth every Thursday.    Marland Kitchen oxyCODONE 10 MG TABS Take 1 tablet (10 mg  total) by mouth every 4 (four) hours as needed for severe pain (pain score 7-10). (Patient not taking: Reported on 05/06/2018) 30 tablet 0   No current facility-administered medications for this visit.     Review of Systems  Constitutional: Positive for malaise/fatigue and weight loss (down 5 pounds). Negative for diaphoresis and fever.       "I'm getting along pretty good".  HENT: Negative.   Eyes: Negative.   Respiratory: Positive for shortness of breath (with exertion). Negative for cough, hemoptysis and sputum production.   Cardiovascular: Negative for chest pain, palpitations, orthopnea, leg swelling and PND.  Gastrointestinal: Positive for abdominal pain (chronic tenderness). Negative for blood in stool, constipation, diarrhea, melena, nausea and vomiting.  Genitourinary: Negative for dysuria, frequency, hematuria and urgency.  Musculoskeletal: Positive for joint pain (OA in shoulders and neck. LEFT TKR - healing well. ). Negative for back pain, falls and myalgias.  Skin: Negative for itching and rash.       Staples out yesterday s/p TKR.  Neurological: Negative for dizziness, tremors, weakness and headaches.  Endo/Heme/Allergies: Does not bruise/bleed easily.  Psychiatric/Behavioral: Negative for depression, memory loss and suicidal ideas. The patient has insomnia. The patient is not nervous/anxious.   All other systems reviewed and are negative.  Performance status (ECOG): 1 - 2  Physical Exam: Blood pressure 125/81, pulse 85, temperature 97.7 F (36.5 C), temperature source Tympanic, resp. rate 18, weight 165 lb 9.1 oz (75.1 kg). GENERAL:  Well developed, well nourished, woman sitting comfortably in the exam room in no acute distress.  She has a walker by her side. MENTAL STATUS:  Alert and oriented to person, place and time. HEAD:  Long brown hair.  Normocephalic, atraumatic, face symmetric, no Cushingoid features. EYES:  Blue eyes.  Pupils equal round and reactive to light and  accomodation.  No conjunctivitis or scleral icterus. ENT:  Oropharynx clear without lesion.  Tongue normal. Mucous membranes moist.  RESPIRATORY:  Clear to auscultation without rales, wheezes or rhonchi. CARDIOVASCULAR:  Regular rate and rhythm without murmur, rub or gallop. ABDOMEN:  Soft, non-tender, with active bowel sounds, and no hepatosplenomegaly.  No masses. SKIN:  No rashes, ulcers or lesions. EXTREMITIES: s/p left TKR healing well with steri-strips in place.  No edema,  no skin discoloration or tenderness.  No palpable cords. LYMPH NODES: No palpable cervical, supraclavicular, axillary or inguinal adenopathy  NEUROLOGICAL: Unremarkable. PSYCH:  Appropriate.  Tearful at times.   Imaging studies: 06/2017:  PET scan revealed enlarging pulmonary nodules but no evidence of abdominal disease per patient report. 09/13/2017:  Abdomen and pelvic CT revealed a small infarct in the inferior pole of the LEFT kidney and RIGHT uterine infarct. 11/08/2017:  PET scan at the [place] [facility] revealed innumerable stable bilateral cavitary pulmonary nodules (up to 8 mm), some of which demonstrate low level metabolic activity.  There were post treatment changes in the pelvis without evidence of suspicious hypermetabolic activity. 12/03/2017:  Abdomen and pelvic CT revealed continued multiple pulmonary nodules in the lung bases concerning for metastatic disease.  There was mild wall thickening of rectosigmoid colon is noted.  There was a stable 1.8 x 1.6 cm mixed fat and soft tissue density noted in right lower quadrant which may represent fat necrosis.  There was a larger low density is noted involving uterine fundus. 12/03/2017:  Pelvic MRI was unremarkable.   04/15/2018:  Chest, abdomen, and pelvis CT revealed innumerable (> 100) cavitary nodules scattered in the lungs, moderately enlarging compared to the 11/08/2017 PET-CT, suspicious for metastatic disease.  One index node in the RLL  measures 1.0 x 1.1 cm (previously 0.6 x 0.6 cm).  There were no new nodules.  There was an ill-defined wall thickening in the rectosigmoid with surrounding stranding along fascia planes, probably sequela from prior radiation therapy.  There was multilevel lumbar impingement due to spondylosis and degenerative disc disease.  There was heterogeneous enhancement in the uterus (some possibly from prior radiation therapy). 04/23/2018:  PET scan revealed numerous scattered solid and cavitary nodules in the lungs stable increased in size compared to the prior PET-CT from 11/08/2017. Largest nodule was 1.1 cm in the LUL (SUV 1.9).  These demonstrated low-grade metabolic activity up to a maximum SUV of about 2.3, increased from 11/08/2017.    Hospital Outpatient Visit on 05/06/2018  Component Date Value Ref Range Status  . WBC 05/06/2018 3.5* 3.6 - 11.0 K/uL Final  . RBC 05/06/2018 3.31* 3.80 - 5.20 MIL/uL Final  . Hemoglobin 05/06/2018 11.6* 12.0 - 16.0 g/dL Final  . HCT 05/06/2018 33.4* 35.0 - 47.0 % Final  . MCV 05/06/2018 100.9* 80.0 - 100.0 fL Final  . MCH 05/06/2018 34.9* 26.0 - 34.0 pg Final  . MCHC 05/06/2018 34.6  32.0 - 36.0 g/dL Final  . RDW 05/06/2018 12.9  11.5 - 14.5 % Final  . Platelets 05/06/2018 294  150 - 440 K/uL Final   Performed at [facility], [address] [place], [place] [address]  . Prothrombin Time 05/06/2018 12.4  11.4 - 15.2 seconds Final  . INR 05/06/2018 0.93   Final   Performed at [facility], [address] [place], [place] [address]  . SURGICAL PATHOLOGY 05/06/2018    Final                   Value:Surgical Pathology CASE: [id number] PATIENT: [facility] Surgical Pathology Report     SPECIMEN SUBMITTED: A. Lung upper lobe, left  CLINICAL HISTORY: History of cervical cancer, now with hypermetabolic pulmonary nodules worrisome for METS, post CT guided BX of left upper lobe pulmonary nodule.  PRE-OPERATIVE DIAGNOSIS: None  provided  POST-OPERATIVE DIAGNOSIS: None provided.     DIAGNOSIS: A.  LUNG, LEFT UPPER LOBE; CT-GUIDED BIOPSY: - ADENOCARCINOMA  MORPHOLOGICALLY CONSISTENT WITH METASTATIC CERVICAL ADENOCARCINOMA.  Note: Clinical history of cervical adenocarcinoma and findings on imaging are noted.  Immunohistochemical stain p16 is positive while PAX8 is negative.  Sufficient material is available for ancillary studies.  IHC slides were prepared by [facility], [place], [place] and interpreted by Dr. Luana Shu. All controls stained appropriately.  This test was developed and its performance characteristics det                         ermined by [facility]. It has not been cleared or approved by the [place] Food and Drug Administration. The FDA does not require this test to go through premarket FDA review. This test is used for clinical purposes. It should not be regarded as investigational or for research. This laboratory is certified under the Clinical Laboratory Improvement Amendments (CLIA) as qualified to perform high complexity clinical laboratory testing.    GROSS DESCRIPTION: A. Labeled: Lung biopsy Received: In formalin Tissue fragment(s): Multiple Size: Aggregate, 0.9 x 0.2 x 0.1 cm Description: Red to brown fragments Entirely submitted in one cassette.     Final Diagnosis performed by Delorse Lek, MD.   Electronically signed 05/08/2018 2:16:57PM The electronic signature indicates that the named Attending Pathologist has evaluated the specimen  Technical component performed at [facility], [address] [place], [place] [address] Lab: [phone] Dir: Rush Farmer, MD, MMM  Professional compo                         nent performed at [facility], [place], [place], [place] [address] Lab: [phone] Dir: Dellia Nims. Rubinas, MD     Assessment:  GERIANNE SIMONET is a 52 y.o. female with a history of stage IB1 cervical  cancer s/p concurrent cisplatin and radiation (external beam and brachytherapy) from 11/2016 - 02/2017.    She was treated by Dr. Christene Slates at [facility] in [place], [place].  In 01/2017, she received cisplatin x 2 and carboplatin x 1 (01/29/2017) due to ARF and XRT. Radiation was followed by T & O on 02/01/2017 and T & N on 02/10/2017 and 02/20/2017.  Course was complicated by weight loss (80 pounds), nausea, vomiting, electrolyte wasting (potassium and magnesium), and multiple hospitalizations.  PET scan in 06/2017 revealed enlarging pulmonary nodules but no evidence of abdominal disease per patient report.  She is s/p right ileocolectomy on 08/31/2017 for a small bowel obstruction.  She presented with abdominal pain.  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  Chest, abdomen, and pelvis CT on 04/15/2018 revealed innumerable (> 100) cavitary nodules scattered in the lungs, moderately enlarging compared to the 11/08/2017 PET-CT, suspicious for metastatic disease. One index node in the RLL measures 1.0 x 1.1 cm.  There were no new nodules.  There was an ill-defined wall thickening in the rectosigmoid with surrounding stranding along fascia planes, probably sequela from prior radiation therapy.  There was multilevel lumbar impingement due to spondylosis and degenerative disc disease.  There was heterogeneous enhancement in the uterus (some possibly from prior radiation therapy).  PET scan on 04/23/2018 revealed numerous scattered solid and cavitary nodules in the lungs stable increased in size compared to the prior PET-CT from 11/08/2017. Largest nodule was 1.1 cm in the LUL (SUV 1.9).  These demonstrated low-grade metabolic activity up  to a maximum SUV of about 2.3, increased from 11/08/2017.   CT guided biopsy of a left upper lobe pulmonary nodule on 05/06/2018 confirmed metastatic  adenocarcinoma morphologically c/w cervical adenocarcinoma.  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  Testing was negative for prothrombin gene mutation, lupus anticoagulant panel, anticardiolipin antibodies, and beta-2 glycoprotein antibodies.  She was discharged on Xarelto.  She was admitted to [facility] from 12/03/2017 - 12/08/2017 with abdominal pain. She was not felt to have a uterine infarct.  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  The etiology was unclear.  She is hepatitis C positive.   Hepatitis C genotype is 2a/2c.  She has B12 deficiency.  B12 was 165 on 11/27/2017.  She began B12 injections.  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  Symptomatically, she is recovering well from her knee replacement.  She denies any respiratory symptoms.  Exam is unremarkable.   Plan:   1. Discuss interval PET scan.  Images personally reviewed- nodules solid and cavitary are growing.  Largest nodule 1.1 cm. 2. Discuss interval CT guided biopsy. Pathology (+) for metastatic adenocarcinoma c/w cervical primary. Discuss treatment regimens. Patient provided with printed information today on her AVS for review at home. Patient encouraged to make a list of questions and/or concerns for further discussion prior to beginning therapy using this medication.   Carboplatin + Taxol  -  discussed risk of myelosuppression, hair loss, allergic reactions, electrolyte wasting (potassium and magnesium), neuropathy, nail changes.  Response rates anticipated > 50%.  Discuss carboplatin versus cisplatin (carboplatin non-inferior)  Addition of bevacizumab - discussed risk of elevated blood pressure (12%), thromboembolic events (8%), fistula development (3%), and bowel perforation.  Discuss issues with wound healing s/p recent surgery.  Discuss plan to postpone initiation of Avastin with chemotherapy.  Discuss published 4 month  increase OS with Avastin added to chemotherapy (17 versus 13 months; RR 48% versus 36%). Patient agrees with holding Avastin. 3. Discuss interval knee replacement. 4. Discuss B12 deficiency and injections. 5. Refer to vascular surgery for port placement.  6. Schedule for chemotherapy education class (carboplatin + Taxol).  7. Preauthorize chemotherapy drugs with Neulasta support. [place] One and PDL-1 testing.  9. RTC in 3 weeks for MD assessment, labs (CBC with diff, CMP, Mg) and cycle #1 carboplatin + Taxol (new)   Honor Loh, NP  05/08/2018, 2:32 PM   I saw and evaluated the patient, participating in the key portions of the service and reviewing pertinent diagnostic studies and records.  I reviewed the nurse practitioner's note and agree with the findings and the plan.  The assessment and plan were discussed with the patient.  Multiple questions were asked by the patient and answered.   Nolon Stalls, MD 05/08/2018,2:32 PM

## 2018-05-09 ENCOUNTER — Other Ambulatory Visit (INDEPENDENT_AMBULATORY_CARE_PROVIDER_SITE_OTHER): Payer: Self-pay | Admitting: Vascular Surgery

## 2018-05-09 ENCOUNTER — Encounter (INDEPENDENT_AMBULATORY_CARE_PROVIDER_SITE_OTHER): Payer: Self-pay

## 2018-05-12 MED ORDER — CEFAZOLIN SODIUM-DEXTROSE 2-4 GM/100ML-% IV SOLN
2.0000 g | Freq: Once | INTRAVENOUS | Status: AC
  Administered 2018-05-13: 2 g via INTRAVENOUS

## 2018-05-13 ENCOUNTER — Ambulatory Visit
Admission: RE | Admit: 2018-05-13 | Discharge: 2018-05-13 | Disposition: A | Discharge status: Home / Self Care | Payer: Medicaid Other | Source: Ambulatory Visit | Attending: Vascular Surgery | Admitting: Vascular Surgery

## 2018-05-13 ENCOUNTER — Encounter
Admission: RE | Disposition: A | Discharge status: Home / Self Care | Payer: Self-pay | Source: Ambulatory Visit | Attending: Vascular Surgery

## 2018-05-13 DIAGNOSIS — D6851 Activated protein C resistance: Secondary | ICD-10-CM | POA: Insufficient documentation

## 2018-05-13 DIAGNOSIS — M1990 Unspecified osteoarthritis, unspecified site: Secondary | ICD-10-CM | POA: Insufficient documentation

## 2018-05-13 DIAGNOSIS — Z7902 Long term (current) use of antithrombotics/antiplatelets: Secondary | ICD-10-CM | POA: Insufficient documentation

## 2018-05-13 DIAGNOSIS — Z888 Allergy status to other drugs, medicaments and biological substances status: Secondary | ICD-10-CM | POA: Diagnosis not present

## 2018-05-13 DIAGNOSIS — R918 Other nonspecific abnormal finding of lung field: Secondary | ICD-10-CM | POA: Insufficient documentation

## 2018-05-13 DIAGNOSIS — Z9049 Acquired absence of other specified parts of digestive tract: Secondary | ICD-10-CM | POA: Insufficient documentation

## 2018-05-13 DIAGNOSIS — M79604 Pain in right leg: Secondary | ICD-10-CM | POA: Diagnosis not present

## 2018-05-13 DIAGNOSIS — E538 Deficiency of other specified B group vitamins: Secondary | ICD-10-CM | POA: Insufficient documentation

## 2018-05-13 DIAGNOSIS — Z87891 Personal history of nicotine dependence: Secondary | ICD-10-CM | POA: Insufficient documentation

## 2018-05-13 DIAGNOSIS — Z9889 Other specified postprocedural states: Secondary | ICD-10-CM | POA: Insufficient documentation

## 2018-05-13 DIAGNOSIS — F419 Anxiety disorder, unspecified: Secondary | ICD-10-CM | POA: Diagnosis not present

## 2018-05-13 DIAGNOSIS — R011 Cardiac murmur, unspecified: Secondary | ICD-10-CM | POA: Diagnosis not present

## 2018-05-13 DIAGNOSIS — Z8249 Family history of ischemic heart disease and other diseases of the circulatory system: Secondary | ICD-10-CM | POA: Insufficient documentation

## 2018-05-13 DIAGNOSIS — Z79899 Other long term (current) drug therapy: Secondary | ICD-10-CM | POA: Insufficient documentation

## 2018-05-13 DIAGNOSIS — C539 Malignant neoplasm of cervix uteri, unspecified: Secondary | ICD-10-CM | POA: Insufficient documentation

## 2018-05-13 DIAGNOSIS — K219 Gastro-esophageal reflux disease without esophagitis: Secondary | ICD-10-CM | POA: Insufficient documentation

## 2018-05-13 HISTORY — PX: PORTA CATH INSERTION: CATH118285

## 2018-05-13 SURGERY — PORTA CATH INSERTION
Anesthesia: Moderate Sedation

## 2018-05-13 MED ORDER — ONDANSETRON HCL 4 MG/2ML IJ SOLN: 4.0000 mg | Freq: Four times a day (QID) | INTRAMUSCULAR | Status: DC | PRN

## 2018-05-13 MED ORDER — SODIUM CHLORIDE 0.9 % IV SOLN: INTRAVENOUS | Status: DC

## 2018-05-13 MED ORDER — MIDAZOLAM HCL 2 MG/2ML IJ SOLN
INTRAMUSCULAR | Status: DC | PRN
  Administered 2018-05-13: 1 mg via INTRAVENOUS
  Administered 2018-05-13: 2 mg via INTRAVENOUS
  Administered 2018-05-13: 1 mg via INTRAVENOUS

## 2018-05-13 MED ORDER — HYDROMORPHONE HCL 1 MG/ML IJ SOLN: 1.0000 mg | Freq: Once | INTRAMUSCULAR | Status: DC | PRN

## 2018-05-13 MED ORDER — LIDOCAINE-EPINEPHRINE (PF) 1 %-1:200000 IJ SOLN
INTRAMUSCULAR | Status: AC
  Filled 2018-05-13: qty 30

## 2018-05-13 MED ORDER — MIDAZOLAM HCL 5 MG/5ML IJ SOLN
INTRAMUSCULAR | Status: AC
  Filled 2018-05-13: qty 5

## 2018-05-13 MED ORDER — FENTANYL CITRATE (PF) 100 MCG/2ML IJ SOLN
INTRAMUSCULAR | Status: AC
  Filled 2018-05-13: qty 2

## 2018-05-13 MED ORDER — HEPARIN (PORCINE) IN NACL 1000-0.9 UT/500ML-% IV SOLN
INTRAVENOUS | Status: AC
  Filled 2018-05-13: qty 500

## 2018-05-13 MED ORDER — FENTANYL CITRATE (PF) 100 MCG/2ML IJ SOLN
INTRAMUSCULAR | Status: DC | PRN
  Administered 2018-05-13: 25 ug via INTRAVENOUS
  Administered 2018-05-13 (×2): 50 ug via INTRAVENOUS

## 2018-05-13 SURGICAL SUPPLY — 10 items
COVER PROBE U/S 5X48 (MISCELLANEOUS) ×2 IMPLANT
DRAPE INCISE IOBAN 66X45 STRL (DRAPES) ×2 IMPLANT
KIT PORT POWER 8FR ISP CVUE (Port) ×2 IMPLANT
NEEDLE ENTRY 21GA 7CM ECHOTIP (NEEDLE) ×2 IMPLANT
PACK ANGIOGRAPHY (CUSTOM PROCEDURE TRAY) ×2 IMPLANT
SET INTRO CAPELLA COAXIAL (SET/KITS/TRAYS/PACK) ×2 IMPLANT
SUT MNCRL AB 4-0 PS2 18 (SUTURE) ×2 IMPLANT
SUT VIC AB 3-0 SH 27 (SUTURE) ×1
SUT VIC AB 3-0 SH 27X BRD (SUTURE) ×1 IMPLANT
SUT VIC AB 4-0 PS2 18 (SUTURE) ×2 IMPLANT

## 2018-05-13 NOTE — Op Note (Signed)
OPERATIVE NOTE   PROCEDURE: 1. Placement of a right IJ Infuse-a-Port  PRE-OPERATIVE DIAGNOSIS: Metastatic cervical carcinoma  POST-OPERATIVE DIAGNOSIS: Same  SURGEON: Katha Cabal M.D.  ANESTHESIA: Conscious sedation was administered under my direct supervision by the interventional radiology RN. IV Versed plus fentanyl were utilized. Continuous ECG, pulse oximetry and blood pressure was monitored throughout the entire procedure. Conscious sedation was for a total of 28 minutes.  ESTIMATED BLOOD LOSS: Minimal   FINDING(S): 1.  Patent vein  SPECIMEN(S): None  INDICATIONS:   Joanna Hall is a 52 y.o. female who presents with metastatic cervical carcinoma.  She will require chemotherapy and therefore appropriate IV access.  Risks and benefits for Infuse-a-Port have been reviewed patient wishes to proceed..  DESCRIPTION: After obtaining full informed written consent, the patient was brought back to the special procedure suite and placed in the supine position. The patient's right neck and chest wall are prepped and draped in sterile fashion. Appropriate timeout was called.  Ultrasound is placed in a sterile sleeve, ultrasound is utilized to avoid vascular injury as well as secondary to lack of appropriate landmarks. The right internal jugular vein is identified. It is echolucent and homogeneous as well as easily compressible indicating patency. An image is recorded for the permanent record.  Access to the vein with a micropuncture needle is done under direct ultrasound visualization.  1% lidocaine is infiltrated into the soft tissue at the base of the neck as well as on the chest wall.  Under direct ultrasound visualization a micro-needle is inserted into the vein followed by the micro-wire. Micro-sheath was then advanced and a J wire is inserted without difficulty under fluoroscopic guidance. A small counterincision was created at the wire insertion site. A transverse incision is  created 2 fingerbreadths below the scapula and a pocket is fashioned using both blunt and sharp dissection. The pocket is tested for appropriate size with the hub of the Infuse-a-Port. The tunneling device is then used to pull the intravascular portion of the catheter from the pocket to the neck counterincision.  Dilator and peel-away sheath were then inserted over the wire and the wire is removed. Catheter is then advanced into the venous system without difficulty. Peel-away sheath was then removed.  Catheter is then positioned under fluoroscopic guidance at the atrial caval junction. It is then transected connected to the hub and the hope is slipped into the subcutaneous pocket on the chest wall. The hub was then accessed percutaneously and aspirates easily and flushes well and is flushed with 30 cc of heparinized saline. The pocket incision is then closed in layers using interrupted 3-0 Vicryl for the subcutaneous tissues and 4-0 Monocryl subcuticular for skin closure. Dermabond is applied. The neck counterincision was closed with 4-0 Monocryl subcuticular and Dermabond as well.  The patient tolerated the procedure well and there were no immediate complications.  COMPLICATIONS: None  CONDITION: Unchanged  Katha Cabal M.D. [place] [facility] Office: [phone]   05/13/2018, 3:07 PM

## 2018-05-13 NOTE — H&P (Signed)
[facility] History & Physical Update  The patient was interviewed and re-examined.  The patient's previous History and Physical has been reviewed and is unchanged.  There is no change in the plan of care. We plan to proceed with the scheduled procedure.  Review notes patient is followed by Dr. Mike Gip she has metastatic cervical carcinoma to the lung.  She will require chemotherapy and therefore appropriate intravenous access.  She is undergoing placement of an Infuse-a-Port.  Hortencia Pilar, MD  05/13/2018, 2:18 PM

## 2018-05-14 ENCOUNTER — Ambulatory Visit: Payer: Medicaid Other | Admitting: Gastroenterology

## 2018-05-14 ENCOUNTER — Encounter: Payer: Self-pay | Admitting: Vascular Surgery

## 2018-05-15 ENCOUNTER — Other Ambulatory Visit: Payer: Self-pay | Admitting: Hematology and Oncology

## 2018-05-15 ENCOUNTER — Inpatient Hospital Stay (HOSPITAL_BASED_OUTPATIENT_CLINIC_OR_DEPARTMENT_OTHER): Payer: Medicaid Other | Admitting: Obstetrics and Gynecology

## 2018-05-15 ENCOUNTER — Encounter: Payer: Self-pay | Admitting: Hematology and Oncology

## 2018-05-15 VITALS — BP 114/80 | HR 81 | Temp 97.6°F | Resp 18 | Ht 67.0 in | Wt 163.7 lb

## 2018-05-15 DIAGNOSIS — Z923 Personal history of irradiation: Secondary | ICD-10-CM | POA: Diagnosis not present

## 2018-05-15 DIAGNOSIS — Z8541 Personal history of malignant neoplasm of cervix uteri: Secondary | ICD-10-CM

## 2018-05-15 DIAGNOSIS — Z9221 Personal history of antineoplastic chemotherapy: Secondary | ICD-10-CM | POA: Diagnosis not present

## 2018-05-15 DIAGNOSIS — C7802 Secondary malignant neoplasm of left lung: Secondary | ICD-10-CM | POA: Diagnosis not present

## 2018-05-15 DIAGNOSIS — C530 Malignant neoplasm of endocervix: Secondary | ICD-10-CM

## 2018-05-15 DIAGNOSIS — C538 Malignant neoplasm of overlapping sites of cervix uteri: Secondary | ICD-10-CM

## 2018-05-15 DIAGNOSIS — C7800 Secondary malignant neoplasm of unspecified lung: Secondary | ICD-10-CM | POA: Insufficient documentation

## 2018-05-15 NOTE — Progress Notes (Signed)
START OFF PATHWAY REGIMEN - [Other Dx]   [id number]:Carboplatin AUC=6 + Paclitaxel 175 mg/m2 q21 Days:   A cycle is every 21 days:     Paclitaxel      Carboplatin   **Always confirm dose/schedule in your pharmacy ordering system**  Patient Characteristics: Intent of Therapy: Non-Curative / Palliative Intent, Discussed with Patient

## 2018-05-15 NOTE — Progress Notes (Signed)
Gynecologic Oncology Interval Visit   Referring Provider: Dr. Nolon Stalls  Chief Concern: Stage IB1 cervical cancer s/p chemoradiation with ileocolectomy for bowel obstruction  Subjective:  Joanna Hall is a 52 y.o. female, seen in consultation for Dr. Mike Gip for cervical cancer who returns to clinic today for follow-up.  Patient was last seen in clinic by Dr. Fransisca Connors on 10/31/2017.  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  She then had chemoradiation at [facility] with apparent complete response but developed bowel obstruction and had resection of terminal ileum and cecum 11/18 at [facility].  Imaging concerning for multiple lung mets is numerous small lesions were increasing in size.  She had some upper vaginal vault necrosis and she was advised to use hydrogen peroxide douches.  She was having chronic diarrhea postoperatively and has history of chronic hep C and was seen by surgery and GI for management.  On 12/03/2017 she presented to ER for abdominal pain and emesis.  Imaging revealed:  12/03/17- CT A/P: continued multiple pulmonary nodules inthelung bases concerning for metastatic disease. There was mild wall thickening of rectosigmoid colon is noted.There was a stable 1.8 x 1.6 cm mixed fat and soft tissue densitynoted in right lower quadrant which may represent fat necrosis.There was a larger low density is noted involving uterine fundus.   12/03/2017:  Pelvic MRI was unremarkable.   04/15/2018:  Chest, abdomen, and pelvis CT revealed innumerable (> 100) cavitary nodules scattered in the lungs, moderately enlarging compared to the 11/08/2017 PET-CT, suspicious for metastatic disease.  One index node in the RLL measures 1.0 x 1.1 cm (previously 0.6 x 0.6 cm).  There were no new nodules.  There was an ill-defined wall thickening in the rectosigmoid with surrounding  stranding along fascia planes, probably sequela from prior radiation therapy.  There was multilevel lumbar impingement due to spondylosis and degenerative disc disease.  There was heterogeneous enhancement in the uterus (some possibly from prior radiation therapy).  04/23/2018:  PET scan revealed numerous scattered solid and cavitary nodules in the lungs stable increased in size compared to the prior PET-CT from 11/08/2017. Largest nodule was 1.1 cm in the LUL (SUV 1.9).  These demonstrated low-grade metabolic activity up to a maximum SUV of about 2.3, increased from 11/08/2017.   Case was discussed at tumor board on 04/25/2018 since this was to pursue CT-guided biopsy.   On 05/06/2018 she underwent CT-guided biopsy of left upper lobe pulmonary nodule.  Pathology: Metastatic adenocarcinoma morphologically consistent with cervical adenocarcinoma.   On 04/24/2018 she underwent left total knee replacement with Dr. [name].  She is hepatitis C positive.  Hepatitis C genotype is 2a/2c.  She received B12 injections for history of B12 deficiency.   Today, she reports she saw Dr Mike Gip and is scheduled to start carbo/taxol chemotherapy soon.  An IV port was placed for access.  No gyn complaints today.  Not sexually active.   History The patient was diagnosed with cervical adenocarcinoma in [place] in 09/2016.  She has had a long standing history of abnormal PAP smears.  She presented with an ovarian cyst.  Laparoscopic surgery was difficult secondary to scar tissue.  Had BSO and rupture of cyst with purulent drainage into abdomen.  Had Stage IB1 (4 cm) endocervical adenocarcinoma. Radical hysterectomy aborted.   She was treated by Dr Christene Slates at [facility] in [place], [place]. Decision was made  to pursue concurrent chemotherapy (weekly cisplatin) and radiation.  She received treatment from 11/2016 - 05/2017.  01/2017 cisplatin x 2 and Carboplatin x 1 (01/29/17) due to  ARF and XRT. XRT followed by T & O on 02/01/17 and T & N 4.28/18 and 02/20/17 . Course was complicated by weight loss (80 pounds), nausea, vomiting, electrolyte wasting (potassium and magnesium).  She describes being "sick constantly" and requiring at least 20 hospitalizations.   She underwent follow-up chest CT then PET scan in 06/2017.  She states that "the radiation worked" and there was no disease in the abdomen.  She was noted to have lung nodules that were growing.  She was scheduled to have follow-up imaging in 10/2017.  She was admitted in [place] with a small bowel obstruction.  She was managed conservatively.  She was home for about a week then traveled to [place] for Thanksgiving holiday (she has family here).  She presented on 08/27/2017 with nausea, vomiting, and lower abdominal pain.  Symptoms did not respond to conservative measurement.    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  She underwent laparotomy and right ileocolectomy on 08/31/2017 at [facility].  There were numerous pulmonary nodules c/w metastatic disease.  Surgical findings revealed a thickened, matted, and scarred piece of distal small bowel close to the ileocecal valve.  Diet was slowly advanced.  She was discharged on 09/05/2017.  Abdominal and pelvic CT without contrast on 09/11/2017 revealed debris within anterior abdominal wall incision concerning for infection, versus packing material.   She is s/p ileocolectomy with expected postoperative changes and mild colonic ileus.  There were numerous pulmonary nodules highly concerning for metastatic disease and punctate nonobstructing nephrolithiasis. She was readmitted on 09/12/2017.  She describes the onset of lower abdominal pain on 09/09/2017.  Pain markedly  increased in intensity on 09/11/2017.   Staples were removed and the wound packed.  She was started on doxycycline.  Abdomen and pelvic CT with contrast revealed postsurgical changes from ileocecectomy with primary ileocolic anastomosis without evidence of abscess or leak.  There was edema of small bowel loops at distal ileum potentially related to surgery.  There was gas within ventral midline surgical wound corresponding to wound infection versus packing material.  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  She denies any history of thrombosis.  She denies any family history of thrombosis.  She stopped smoking 10 years ago (1/4 ppd) and has a history of thoracotomy.  She denies any family history of rheumatologic problems/vasculitis/autoimmune disorders.  Thrombosis work up showed she is heterozygous for Factor V Leiden and taking Xarelto.  Patient had diarrhea despite Lomotil and Imodium with 15 watery BMs per day.  Dr. Hampton Abbot who did the bowel resection managing.  PET scan ordered to evaluate enlarging lung nodules with concern for recurrent cervical cancer. Scan not done yet due to insurance issues and she will have to get the study done in [place].   Problem List: Patient Active Problem List   Diagnosis Date Noted  . Malignant neoplasm metastatic to lung ([place]) 05/15/2018  . S/P TKR (total knee replacement) using cement, left 04/24/2018  . Osteoarthritis of left knee 02/28/2018  . Condyloma acuminatum of anus s/p ablation 02/22/2018 02/22/2018  . Condyloma acuminatum of vagina s/p  ablation 02/22/2018 02/22/2018  . Positive ANA (antinuclear antibody) 02/04/2018  . Aortic atherosclerosis ([place]) 01/15/2018  . Elevated MCV 01/15/2018  . Anemia 01/15/2018  . Genital warts 01/15/2018  . Vitamin D deficiency 01/15/2018  . Pernicious anemia   . Rectal bleeding   . Impingement syndrome of shoulder region 12/19/2017  . Multiple joint pain  12/19/2017  . Osteoarthritis of right knee 12/19/2017  . B12 deficiency 12/11/2017  . Lung nodules 12/11/2017  . History of Clostridium difficile infection 10/23/2017  . Factor V Leiden ([place]) 10/19/2017  . Goals of care, counseling/discussion 10/19/2017  . Abnormal cervical Papanicolaou smear 09/18/2017  . Cervical cancer, FIGO stage IB1 ([place]) 09/18/2017  . Chronic hepatitis C without hepatic coma ([place]) 09/18/2017  . Cytopenia 09/18/2017  . Diarrhea 09/18/2017  . Erosive gastropathy 09/18/2017  . Intestinal infection due to Clostridium difficile 09/18/2017  . Lower abdominal pain 09/18/2017  . 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 Malignant neoplasm of overlapping sites of cervix ([place]) 09/18/2017  . Renal insufficiency 09/18/2017  . Wound infection after surgery 09/12/2017  . Hypokalemia   . Hypomagnesemia   . Ileocolic anastomotic leak   . Cervical arthritis 07/18/2017  . Dysuria 06/20/2017  . Metastatic cancer ([place]) 05/12/2017  . Hepatitis, chronic ([place]) 05/05/2017  . Essential hypertension 03/15/2017  . Anemia in other chronic diseases classified elsewhere 03/01/2017  . Chemotherapy-induced neutropenia ([place]) 01/29/2017  . 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[place]) 09/25/2016    Past Medical History: Past Medical History:  Diagnosis Date  . Anxiety   . Aortic atherosclerosis ([place])   . Arthritis    neck and knees  . 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 Blood transfusion without reported diagnosis   . Cervical cancer (HCC)    mets lung  . Chronic anal fissure   . Chronic diarrhea   . Dyspnea   . Factor V Leiden mutation ([place])   . Fecal incontinence   . Genital warts   . GERD (gastroesophageal reflux disease)   . Heart murmur   . Hemorrhoids   . 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 History of cancer chemotherapy    completed 06/2017  . History of Clostridium difficile infection    while undergoing chemo.  Negative test  10/2017  . Infarction of kidney ([place]) left kidney   and uterus  . Macrocytic anemia with vitamin B12 deficiency   . Perianal condylomata   . Pneumonia    History of  . Pulmonary nodules   . Small bowel obstruction ([place]) 08/2017  . Stiff neck    limited right turn  . Vitamin D deficiency     Past Surgical History: Past Surgical History:  Procedure Laterality Date  . CHOLECYSTECTOMY    . COLON SURGERY  08/2017   resection  . COLONOSCOPY WITH PROPOFOL N/A 12/20/2017   Procedure: COLONOSCOPY WITH PROPOFOL;  Surgeon: [name], MD;  Location: [facility] ENDOSCOPY;  Service: Gastroenterology;  Laterality: N/A;  . DIAGNOSTIC LAPAROSCOPY    . ESOPHAGOGASTRODUODENOSCOPY (EGD) WITH PROPOFOL N/A 12/20/2017   Procedure: ESOPHAGOGASTRODUODENOSCOPY (EGD) WITH PROPOFOL;  Surgeon: [name], MD;  Location: [place];  Service: Gastroenterology;  Laterality: N/A;  . LAPAROTOMY N/A 08/31/2017   Procedure: EXPLORATORY LAPAROTOMY for SBO, ileocolectomy, removal of piece of uterine wall;  Surgeon: [name], MD;  Location: [facility] ORS;  Service: General;  Laterality: N/A;  . [place] N/A 02/22/2018  Procedure: LASER ABLATION/REMOVAL OF CONDOLAMATA AROUND ANUS AND VAGINA;  Surgeon: Michael Boston, MD;  Location: [place];  Service: General;  Laterality: N/A;  . OOPHORECTOMY    . PORTA CATH INSERTION N/A 05/13/2018   Procedure: PORTA CATH INSERTION;  Surgeon: Katha Cabal, MD;  Location: [place] [facility] CV LAB;  Service: Cardiovascular;  Laterality: N/A;  . SMALL INTESTINE SURGERY    . TANDEM RING INSERTION     x3  . THORACOTOMY    . TOTAL KNEE ARTHROPLASTY Left 04/24/2018   Procedure: TOTAL KNEE ARTHROPLASTY;  Surgeon: Lovell Sheehan, MD;  Location: [facility] ORS;  Service: Orthopedics;  Laterality: Left;     Family History: Family History  Problem Relation Age of Onset  . Hypertension Father   . Diabetes Father   . Alcohol abuse Daughter   .  Hypertension Maternal Grandmother   . Diabetes Maternal Grandmother   . Diabetes Paternal Grandmother   . Hypertension Paternal Grandmother     Social History: Social History   Socioeconomic History  . Marital status: Legally Separated    Spouse name: Not on file  . Number of children: Not on file  . Years of education: Not on file  . Highest education level: Not on file  Occupational History  . Not on file  Social Needs  . Financial resource strain: Not on file  . Food insecurity:    Worry: Not on file    Inability: Not on file  . Transportation needs:    Medical: Not on file    Non-medical: Not on file  Tobacco Use  . Smoking status: Former Smoker    Last attempt to quit: 10/16/2006    Years since quitting: 11.5  . Smokeless tobacco: Never Used  Substance and Sexual Activity  . Alcohol use: Yes    Frequency: Never    Comment: seldom  . Drug use: Yes    Types: Marijuana  . Sexual activity: Not Currently    Birth control/protection: Post-menopausal    Comment: Not Asked  Lifestyle  . Physical activity:    Days per week: Not on file    Minutes per session: Not on file  . Stress: Not on file  Relationships  . Social connections:    Talks on phone: Not on file    Gets together: Not on file    Attends religious service: Not on file    Active member of club or organization: Not on file    Attends meetings of clubs or organizations: Not on file    Relationship status: Not on file  . Intimate partner violence:    Fear of current or ex partner: Not on file    Emotionally abused: Not on file    Physically abused: Not on file    Forced sexual activity: Not on file  Other Topics Concern  . Not on file  Social History Narrative  . Not on file    Allergies: Allergies  Allergen Reactions  . Ketamine Anxiety and Other (See Comments)    Syncope episode/confusion     Current Medications: Current Outpatient Medications  Medication Sig Dispense Refill  .  cholestyramine (QUESTRAN) 4 g packet Take 1 packet (4 g total) by mouth 3 (three) times daily. (Patient taking differently: Take 4 g by mouth 2 (two) times daily. ) 270 packet 0  . cyanocobalamin (,VITAMIN B-12,) 1000 MCG/ML injection Inject 1ML every week x 4 weeks, then 1ML every other week x 2 months, then monthly  x 3 months (Patient taking differently: Inject 1,000 mcg into the muscle every 30 (thirty) days. Inject 1ML every week x 4 weeks, then 1ML every other week x 2 months, then monthly x 3 months) 10 mL 1  . dicyclomine (BENTYL) 10 MG capsule Take 1 capsule (10 mg total) by mouth 4 (four) times daily -  before meals and at bedtime. 360 capsule 1  . fluticasone (FLONASE) 50 MCG/ACT nasal spray Place 2 sprays into both nostrils daily. 16 g 0  . HYDROmorphone (DILAUDID) 4 MG tablet Take 4 mg by mouth every 6 (six) hours as needed for severe pain.     Marland Kitchen loratadine (CLARITIN) 10 MG tablet Take 10 mg by mouth daily.    . rivaroxaban (XARELTO) 20 MG TABS tablet Take 1 tablet (20 mg total) by mouth daily with supper. 30 tablet 1  . sertraline (ZOLOFT) 50 MG tablet Take 1 tablet (50 mg total) by mouth daily. 30 tablet 3  . Syringe/Needle, Disp, (SYRINGE 3CC/22GX1") 22G X 1" 3 ML MISC For use with Vitamin B12 injections 50 each 0  . VENTOLIN HFA 108 (90 Base) MCG/ACT inhaler INHALE 1-2 PUFFS BY MOUTH EVERY 4 HOURS AS NEEDED FOR WHEEZING / SHORTNESS OF BREATH  0  . hydrocortisone (ANUSOL-HC) 2.5 % rectal cream Place 1 application rectally 2 (two) times daily. (Patient not taking: Reported on 05/09/2018) 30 g 0  . Multiple Vitamin (MULTIVITAMIN WITH MINERALS) TABS tablet Take 1 tablet daily by mouth.    . oxyCODONE 10 MG TABS Take 1 tablet (10 mg total) by mouth every 4 (four) hours as needed for severe pain (pain score 7-10). (Patient not taking: Reported on 05/06/2018) 30 tablet 0   No current facility-administered medications for this visit.     Review of Systems General: fatigue, weakness, dec  appetite Skin: no complaints Eyes: no complaints HEENT: no complaints Breasts: no complaints Pulmonary: sob Cardiac: no complaints Gastrointestinal: n/v, diarrhea Genitourinary/Sexual: no complaints Ob/Gyn: no complaints Musculoskeletal: knee pain Hematology: no complaints; continues xarelto Neurologic/Psych: depression   Objective:  Physical Examination:  BP 114/80   Pulse 81   Temp 97.6 F (36.4 C) (Tympanic)   Resp 18   Ht _0  (1.702 m)   Wt 163 lb 11.2 oz (74.3 kg)   BMI 25.64 kg/m    ECOG Performance Status: 1 - Symptomatic but completely ambulatory  General appearance: alert, cooperative and appears stated age HEENT:neck supple with midline trachea and thyroid without masses Lymph node survey: non-palpable, axillary, inguinal, supraclavicular Chest: IV port incisions in right chest and neck. Cardiovascular: regular rate and rhythm, no murmurs or gallops Respiratory: normal air entry, lungs clear to auscultation and no rales, rhonchi or wheezing Breast exam: not examined. Abdomen: no hernias. Incision now well healed.  Nontender and not distended, with quiet bowel sounds and no mass.  Back: inspection of back is normal Extremities: extremities normal, atraumatic, no cyanosis or edema Skin exam - normal coloration and turgor, no rashes, no suspicious skin lesions noted. Neurological exam reveals alert, oriented, normal speech, no focal findings or movement disorder noted.  Pelvic: exam chaperoned by nurse; Vulva normal, Vagina now well healed and somewhat shortened.  Mucosa pale, no lesions.  Bimanual/RV:normal  No masses or nodularity.         Assessment:  Joanna Hall is a 52 y.o. female diagnosed with recurrent stage IB1 cervical adenocarcinoma s/p aborted radical hysterectomy due to infected adnexa 2/18 at [facility].  Then had chemoradiation at [facility] with  apparent complete response.  Developed bowel obstruction and had resection of terminal ileum and cecum 11/18  at [facility].  Now with new lung metastases 7/19.   Vaginal vault necrosis well healed now.  Medical co-morbidities complicating care:untreated Hepatitis C .  Plan:   Problem List Items Addressed This Visit      Genitourinary   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[place]) - Primary      Agree with plan for carboplatin/taxol chemotherapy with Dr Mike Gip and she will start this soon. Would probably avoid bevacizumab in view of her being on Xarelto for factor V Leiden and renal infarct.  CT scan after 3 cycles to assess response.    Also will check PDL1 on the lung biopsy to see if she would be a candidate for Keytruda in the future if she progresses.    We can see her back in 3-4 months for follow up or sooner if needed.   Mellody Drown, MD  Dr. Nolon Stalls. [facility]  Dr. Fleet Contras at [facility] [phone])

## 2018-05-15 NOTE — Patient Instructions (Signed)
Paclitaxel injection What is this medicine? PACLITAXEL (PAK li TAX el) is a chemotherapy drug. It targets fast dividing cells, like cancer cells, and causes these cells to die. This medicine is used to treat ovarian cancer, breast cancer, and other cancers. This medicine may be used for other purposes; ask your health care provider or pharmacist if you have questions. COMMON BRAND NAME(S): Onxol, Taxol What should I tell my health care provider before I take this medicine? They need to know if you have any of these conditions: -blood disorders -irregular heartbeat -infection (especially a virus infection such as chickenpox, cold sores, or herpes) -liver disease -previous or ongoing radiation therapy -an unusual or allergic reaction to paclitaxel, alcohol, polyoxyethylated castor oil, other chemotherapy agents, other medicines, foods, dyes, or preservatives -pregnant or trying to get pregnant -breast-feeding How should I use this medicine? This drug is given as an infusion into a vein. It is administered in a hospital or clinic by a specially trained health care professional. Talk to your pediatrician regarding the use of this medicine in children. Special care may be needed. Overdosage: If you think you have taken too much of this medicine contact a poison control center or emergency room at once. NOTE: This medicine is only for you. Do not share this medicine with others. What if I miss a dose? It is important not to miss your dose. Call your doctor or health care professional if you are unable to keep an appointment. What may interact with this medicine? Do not take this medicine with any of the following medications: -disulfiram -metronidazole This medicine may also interact with the following medications: -cyclosporine -diazepam -ketoconazole -medicines to increase blood counts like filgrastim, pegfilgrastim, sargramostim -other chemotherapy drugs like cisplatin, doxorubicin,  epirubicin, etoposide, teniposide, vincristine -quinidine -testosterone -vaccines -verapamil Talk to your doctor or health care professional before taking any of these medicines: -acetaminophen -aspirin -ibuprofen -ketoprofen -naproxen This list may not describe all possible interactions. Give your health care provider a list of all the medicines, herbs, non-prescription drugs, or dietary supplements you use. Also tell them if you smoke, drink alcohol, or use illegal drugs. Some items may interact with your medicine. What should I watch for while using this medicine? Your condition will be monitored carefully while you are receiving this medicine. You will need important blood work done while you are taking this medicine. This medicine can cause serious allergic reactions. To reduce your risk you will need to take other medicine(s) before treatment with this medicine. If you experience allergic reactions like skin rash, itching or hives, swelling of the face, lips, or tongue, tell your doctor or health care professional right away. In some cases, you may be given additional medicines to help with side effects. Follow all directions for their use. This drug may make you feel generally unwell. This is not uncommon, as chemotherapy can affect healthy cells as well as cancer cells. Report any side effects. Continue your course of treatment even though you feel ill unless your doctor tells you to stop. Call your doctor or health care professional for advice if you get a fever, chills or sore throat, or other symptoms of a cold or flu. Do not treat yourself. This drug decreases your body's ability to fight infections. Try to avoid being around people who are sick. This medicine may increase your risk to bruise or bleed. Call your doctor or health care professional if you notice any unusual bleeding. Be careful brushing and flossing your teeth or  using a toothpick because you may get an infection or  bleed more easily. If you have any dental work done, tell your dentist you are receiving this medicine. Avoid taking products that contain aspirin, acetaminophen, ibuprofen, naproxen, or ketoprofen unless instructed by your doctor. These medicines may hide a fever. Do not become pregnant while taking this medicine. Women should inform their doctor if they wish to become pregnant or think they might be pregnant. There is a potential for serious side effects to an unborn child. Talk to your health care professional or pharmacist for more information. Do not breast-feed an infant while taking this medicine. Men are advised not to father a child while receiving this medicine. This product may contain alcohol. Ask your pharmacist or healthcare provider if this medicine contains alcohol. Be sure to tell all healthcare providers you are taking this medicine. Certain medicines, like metronidazole and disulfiram, can cause an unpleasant reaction when taken with alcohol. The reaction includes flushing, headache, nausea, vomiting, sweating, and increased thirst. The reaction can last from 30 minutes to several hours. What side effects may I notice from receiving this medicine? Side effects that you should report to your doctor or health care professional as soon as possible: -allergic reactions like skin rash, itching or hives, swelling of the face, lips, or tongue -low blood counts - This drug may decrease the number of white blood cells, red blood cells and platelets. You may be at increased risk for infections and bleeding. -signs of infection - fever or chills, cough, sore throat, pain or difficulty passing urine -signs of decreased platelets or bleeding - bruising, pinpoint red spots on the skin, black, tarry stools, nosebleeds -signs of decreased red blood cells - unusually weak or tired, fainting spells, lightheadedness -breathing problems -chest pain -high or low blood pressure -mouth sores -nausea and  vomiting -pain, swelling, redness or irritation at the injection site -pain, tingling, numbness in the hands or feet -slow or irregular heartbeat -swelling of the ankle, feet, hands Side effects that usually do not require medical attention (report to your doctor or health care professional if they continue or are bothersome): -bone pain -complete hair loss including hair on your head, underarms, pubic hair, eyebrows, and eyelashes -changes in the color of fingernails -diarrhea -loosening of the fingernails -loss of appetite -muscle or joint pain -red flush to skin -sweating This list may not describe all possible side effects. Call your doctor for medical advice about side effects. You may report side effects to FDA at 1-800-FDA-1088. Where should I keep my medicine? This drug is given in a hospital or clinic and will not be stored at home. NOTE: This sheet is a summary. It may not cover all possible information. If you have questions about this medicine, talk to your doctor, pharmacist, or health care provider.  2018 [employer] (2015-08-03 19:58:00) Carboplatin injection What is this medicine? CARBOPLATIN (KAR boe pla tin) is a chemotherapy drug. It targets fast dividing cells, like cancer cells, and causes these cells to die. This medicine is used to treat ovarian cancer and many other cancers. This medicine may be used for other purposes; ask your health care provider or pharmacist if you have questions. COMMON BRAND NAME(S): Paraplatin What should I tell my health care provider before I take this medicine? They need to know if you have any of these conditions: -blood disorders -hearing problems -kidney disease -recent or ongoing radiation therapy -an unusual or allergic reaction to carboplatin, cisplatin, other chemotherapy, other  medicines, foods, dyes, or preservatives -pregnant or trying to get pregnant -breast-feeding How should I use this medicine? This drug  is usually given as an infusion into a vein. It is administered in a hospital or clinic by a specially trained health care professional. Talk to your pediatrician regarding the use of this medicine in children. Special care may be needed. Overdosage: If you think you have taken too much of this medicine contact a poison control center or emergency room at once. NOTE: This medicine is only for you. Do not share this medicine with others. What if I miss a dose? It is important not to miss a dose. Call your doctor or health care professional if you are unable to keep an appointment. What may interact with this medicine? -medicines for seizures -medicines to increase blood counts like filgrastim, pegfilgrastim, sargramostim -some antibiotics like amikacin, gentamicin, neomycin, streptomycin, tobramycin -vaccines Talk to your doctor or health care professional before taking any of these medicines: -acetaminophen -aspirin -ibuprofen -ketoprofen -naproxen This list may not describe all possible interactions. Give your health care provider a list of all the medicines, herbs, non-prescription drugs, or dietary supplements you use. Also tell them if you smoke, drink alcohol, or use illegal drugs. Some items may interact with your medicine. What should I watch for while using this medicine? Your condition will be monitored carefully while you are receiving this medicine. You will need important blood work done while you are taking this medicine. This drug may make you feel generally unwell. This is not uncommon, as chemotherapy can affect healthy cells as well as cancer cells. Report any side effects. Continue your course of treatment even though you feel ill unless your doctor tells you to stop. In some cases, you may be given additional medicines to help with side effects. Follow all directions for their use. Call your doctor or health care professional for advice if you get a fever, chills or sore  throat, or other symptoms of a cold or flu. Do not treat yourself. This drug decreases your body's ability to fight infections. Try to avoid being around people who are sick. This medicine may increase your risk to bruise or bleed. Call your doctor or health care professional if you notice any unusual bleeding. Be careful brushing and flossing your teeth or using a toothpick because you may get an infection or bleed more easily. If you have any dental work done, tell your dentist you are receiving this medicine. Avoid taking products that contain aspirin, acetaminophen, ibuprofen, naproxen, or ketoprofen unless instructed by your doctor. These medicines may hide a fever. Do not become pregnant while taking this medicine. Women should inform their doctor if they wish to become pregnant or think they might be pregnant. There is a potential for serious side effects to an unborn child. Talk to your health care professional or pharmacist for more information. Do not breast-feed an infant while taking this medicine. What side effects may I notice from receiving this medicine? Side effects that you should report to your doctor or health care professional as soon as possible: -allergic reactions like skin rash, itching or hives, swelling of the face, lips, or tongue -signs of infection - fever or chills, cough, sore throat, pain or difficulty passing urine -signs of decreased platelets or bleeding - bruising, pinpoint red spots on the skin, black, tarry stools, nosebleeds -signs of decreased red blood cells - unusually weak or tired, fainting spells, lightheadedness -breathing problems -changes in hearing -changes in  vision -chest pain -high blood pressure -low blood counts - This drug may decrease the number of white blood cells, red blood cells and platelets. You may be at increased risk for infections and bleeding. -nausea and vomiting -pain, swelling, redness or irritation at the injection site -pain,  tingling, numbness in the hands or feet -problems with balance, talking, walking -trouble passing urine or change in the amount of urine Side effects that usually do not require medical attention (report to your doctor or health care professional if they continue or are bothersome): -hair loss -loss of appetite -metallic taste in the mouth or changes in taste This list may not describe all possible side effects. Call your doctor for medical advice about side effects. You may report side effects to FDA at 1-800-FDA-1088. Where should I keep my medicine? This drug is given in a hospital or clinic and will not be stored at home. NOTE: This sheet is a summary. It may not cover all possible information. If you have questions about this medicine, talk to your doctor, pharmacist, or health care provider.  2018 [employer] Standard (2008-01-07 14:38:05)

## 2018-05-15 NOTE — Progress Notes (Signed)
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 Had total knee of left side and going through PT at [facility]

## 2018-05-16 ENCOUNTER — Inpatient Hospital Stay: Payer: Medicaid Other | Attending: Hematology and Oncology

## 2018-05-16 ENCOUNTER — Inpatient Hospital Stay (HOSPITAL_BASED_OUTPATIENT_CLINIC_OR_DEPARTMENT_OTHER): Payer: Medicaid Other | Admitting: Nurse Practitioner

## 2018-05-16 ENCOUNTER — Encounter: Payer: Self-pay | Admitting: Nurse Practitioner

## 2018-05-16 VITALS — BP 125/74 | HR 62 | Temp 96.7°F | Resp 18

## 2018-05-16 DIAGNOSIS — Z5111 Encounter for antineoplastic chemotherapy: Secondary | ICD-10-CM | POA: Insufficient documentation

## 2018-05-16 DIAGNOSIS — K529 Noninfective gastroenteritis and colitis, unspecified: Secondary | ICD-10-CM | POA: Insufficient documentation

## 2018-05-16 DIAGNOSIS — R110 Nausea: Secondary | ICD-10-CM | POA: Diagnosis not present

## 2018-05-16 DIAGNOSIS — C7802 Secondary malignant neoplasm of left lung: Secondary | ICD-10-CM

## 2018-05-16 DIAGNOSIS — Z923 Personal history of irradiation: Secondary | ICD-10-CM | POA: Insufficient documentation

## 2018-05-16 DIAGNOSIS — Z79899 Other long term (current) drug therapy: Secondary | ICD-10-CM | POA: Insufficient documentation

## 2018-05-16 DIAGNOSIS — R1033 Periumbilical pain: Secondary | ICD-10-CM | POA: Insufficient documentation

## 2018-05-16 DIAGNOSIS — R7989 Other specified abnormal findings of blood chemistry: Secondary | ICD-10-CM | POA: Insufficient documentation

## 2018-05-16 DIAGNOSIS — C538 Malignant neoplasm of overlapping sites of cervix uteri: Secondary | ICD-10-CM | POA: Diagnosis not present

## 2018-05-16 DIAGNOSIS — E871 Hypo-osmolality and hyponatremia: Secondary | ICD-10-CM | POA: Insufficient documentation

## 2018-05-16 NOTE — Progress Notes (Signed)
[place]  Telephone:([phone] Fax:([phone]  Patient Care Team: Arnetha Courser, MD as PCP - General (Family Medicine) Mellody Drown, MD as Consulting Physician (Obstetrics and Gynecology) Lenor Coffin, MD as Attending Physician (Gynecology) Lequita Asal, MD as Medical Oncologist (Hematology and Oncology) [name], MD as Consulting Physician (Gastroenterology) Michael Boston, MD as Consulting Physician (General Surgery) Lovell Sheehan, MD as Consulting Physician (Orthopedic Surgery) Clent Jacks, RN as Registered Nurse   Name of the patient: Joanna Hall  [id number]  1966/10/12   Date of visit: 05/16/18  Diagnosis- stage I B1 cervical cancer  Chief complaint/Reason for visit- Initial Meeting for [facility], preparing for starting chemotherapy  Heme/Onc history:  Oncology History   Patient was diagnosed with cervical adenocarcinoma in [place] in 09/2016.  She has had a long-standing history of abnormal Pap smears.  She presented with an ovarian cyst.  Laparoscopic surgery was pursued but was difficult due to scar tissue.  Had BSO and rupture of cyst with purulent drainage into the abdomen.  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  Radical hysterectomy was aborted.  She was treated by Dr. Christene Slates at [place] [facility] in [place], [place].  Decision was made to pursue concurrent chemotherapy (weekly cisplatin) and radiation.  She received treatment from 11/2016-05/2017.  01/2017 cisplatin x 2 and carboplatin x 1 (01/29/2017) due to ARF and XRT.  XRT was followed by T & O on 02/01/2017 and T & N 02/10/2017 and 02/20/2017.  Course was complicated by 80 pound weight loss, nausea, vomiting, electrolyte wasting (potassium and magnesium).  She describes that.  Is been sick constantly requiring at least 20 hospitalizations.  Follow-up CT chest and  PET on 06/2017. Per patient, 'radiation worked' and no disease in the abdomen.  At that time she was noted to have lung nodules that were growing and follow-up imaging was scheduled for 10/2017.  She was admitted to hospital in [place] for small bowel obstruction which was managed conservatively and she was home for a week prior to traveling to [place] for Thanksgiving holiday where she has family.  She presented to ER in [place] on 08/2017 with nausea, vomiting, and lower abdominal pain.  Symptoms did not respond to conservative treatment.  CT on 08/26/2017 revealed small bowel obstruction with transition in the pelvis just superior to the uterus rather was a long segment of distal ileum with fatty wall thickening compatible with chronic inflammation and/or radiation enteritis. Imaging showed numerous pulmonary nodules consistent with metastatic disease. She underwent laparotomy and right ileocolectomy on 08/31/2017 at [facility]  Surgical findings revealed a thickened, matted, and scarred piece of distal small bowel close to the ileocecal valve.  She was discharged on 09/05/2017.  Pain markedly increased in intensity and imaging was performed on 09/11/2017 which revealed: Debris within the anterior abdominal wall incision concerning for infection versus packing material, s/p post ileo-colectomy with expected postoperative changes, mild colonic ileus, numerous pulmonary nodules highly concerning for metastatic disease, punctate nonobstructing nephrolithiasis.  Staples were removed and one was packed.  She was started on doxycycline.  Abdominal and pelvic CT without contrast on 09/11/2017 revealed debris within anterior abdominal wall incision concerning for infection, versus packing material.She is s/pileocolectomy with expected postoperative changes and mild colonic ileus. There were numerous pulmonary nodules highly concerning for metastatic diseaseand punctate nonobstructing  nephrolithiasis. She was readmitted on 09/12/2017. She describes the  onset of lower abdominal pain on 09/09/2017. Pain markedly increased in intensity on 09/11/2017.  Staples were removed and the wound packed. She was started on doxycycline.  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  PET scan was ordered to evaluate enlarging lung nodules with concern for recurrent cervical cancer but scan was delayed due to insurance and need to be performed in [place].  Presented to ER on 12/03/2017 for abdominal pain and emesis.  Imaging concerning for worsening possible uterine infarct and she was admitted to hospital.  Pelvic MRI was unremarkable.  Remote scarring type changes of uterus thought to be possibly related to radiation.  She was discharged on 12/08/2017.  Underwent endoscopy and colonoscopy on 12/20/2017.    On 02/22/2018 she underwent laser ablation of condylomata around the anus and vagina under anesthesia with Dr. Johney Maine.   04/15/2018: Chest, abdomen, and pelvis CTrevealed innumerable (>100) cavitary nodules scattered in the lungs, moderately enlarging compared to the 11/08/2017 PET-CT, suspicious for metastatic disease. One index node in the RLL measures 1.0 x 1.1 cm (previously 0.6 x 0.6 cm). There were no new nodules. There was an ill-defined wall thickening in the rectosigmoid with surrounding stranding along fascia planes, probably sequela from prior radiation therapy. There was multilevel lumbar impingement due to spondylosis and degenerative disc disease. There was heterogeneous enhancement in the  uterus (some possibly from prior radiation therapy).  04/23/2018:PET scan revealednumerous scattered solid and cavitary nodules in the lungs stable increased in size compared to the prior PET-CT from 11/08/2017.Largest nodule was 1.1 cm in the LUL (SUV 1.9).These demonstratedlow-grade metabolic activity up to a maximum SUV of about 2.3, increased from 11/08/2017.   Case was discussed at tumor board on 04/25/2018. Consensus to pursue CT-guided biopsy (05/06/18) which revealed: Metastatic adenocarcinoma, morphologically consistent with cervical adenocarcinoma.  She has history of chronic hepatitis C which is managed by GI.  Hepatitis C genotype is 2a/2c.  She receives B12 injections for history of B12 deficiency.  On 04/24/2018 she underwent left total knee replacement with Dr. [name].     Malignant neoplasm of overlapping sites of cervix ([facility])   09/18/2017 Initial Diagnosis    Malignant neoplasm of overlapping sites of cervix ([place])    05/15/2018 -  Chemotherapy    The patient had palonosetron (ALOXI) injection 0.25 mg, 0.25 mg, Intravenous,  Once, 0 of 6 cycles pegfilgrastim (NEULASTA) injection 6 mg, 6 mg, Subcutaneous, Once, 0 of 6 cycles CARBOplatin (PARAPLATIN) in sodium chloride 0.9 % 100 mL chemo infusion, , Intravenous,  Once, 0 of 6 cycles PACLitaxel (TAXOL) 330 mg in sodium chloride 0.9 % 500 mL chemo infusion (> 94m/m2), 175 mg/m2, Intravenous,  Once, 0 of 6 cycles  for chemotherapy treatment.       Interval history-Lovelyn CFooris a 52year old female, who presents to chemo care clinic today for initial meeting in preparation for starting chemotherapy. I introduced the chemo care clinic and we discussed that the role of the clinic is to assist those who are at an increased risk of emergency room visits and/or complications during the course of chemotherapy treatment. We discussed that the increased risk takes into account factors such  as age, performance status, and  co-morbidities prior to the diagnosis of cancer. We also discussed that for some, this might include barriers to care such as not having a primary care provider, lack of insurance/transportation, or not being able to afford medications. We discussed that the goal of the program is to help prevent unplanned ER visits and help reduce complications during chemotherapy. We do this by discussing specific risk factors to each individual and identifying ways that we can help improve these risk factors and reduce barriers to care.  Today, she reports that overall she is feeling well.  She had a port placed on 05/13/2018 for administration of chemotherapy.  She was seen yesterday by Dr. Fransisca Connors, gynecology oncology, for follow-up.  She has mild pain from port site administration but otherwise feels well.  ECOG FS:1 - Symptomatic but completely ambulatory  Review of systems- Review of Systems  Constitutional: Negative for chills, fever, malaise/fatigue and weight loss.  HENT: Negative for congestion, ear discharge, ear pain, sinus pain, sore throat and tinnitus.   Eyes: Negative.   Respiratory: Negative.  Negative for cough, sputum production and shortness of breath.   Cardiovascular: Negative for chest pain, palpitations, orthopnea, claudication and leg swelling.  Gastrointestinal: Negative for abdominal pain, blood in stool, constipation, diarrhea, heartburn, nausea and vomiting.  Genitourinary: Negative.   Musculoskeletal: Negative.   Skin: Negative.   Neurological: Negative for dizziness, tingling, weakness and headaches.  Endo/Heme/Allergies: Negative.   Psychiatric/Behavioral: Negative.      Current treatment-carboplatin-paclitaxel q21 days  Allergies  Allergen Reactions  . Ketamine Anxiety and Other (See Comments)    Syncope episode/confusion     Past Medical History:  Diagnosis Date  . Anxiety   . Aortic atherosclerosis ([place])   . Arthritis    neck and knees  . 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 Blood transfusion without reported diagnosis   . Cervical cancer (HCC)    mets lung  . Chronic anal fissure   . Chronic diarrhea   . Dyspnea   . Factor V Leiden mutation ([place])   . Fecal incontinence   . Genital warts   . GERD (gastroesophageal reflux disease)   . Heart murmur   . Hemorrhoids   . 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 History of cancer chemotherapy    completed 06/2017  . History of Clostridium difficile infection    while undergoing chemo.  Negative test 10/2017  . 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 Macrocytic anemia with vitamin B12 deficiency   . Perianal condylomata   . Pneumonia    History of  . Pulmonary nodules   . Small bowel obstruction ([place]) 08/2017  . Stiff neck    limited right turn  . Vitamin D deficiency     Past Surgical History:  Procedure Laterality Date  . CHOLECYSTECTOMY    . COLON SURGERY  08/2017   resection  . COLONOSCOPY WITH PROPOFOL N/A 12/20/2017   Procedure: COLONOSCOPY WITH PROPOFOL;  Surgeon: [name], MD;  Location: [facility] ENDOSCOPY;  Service: Gastroenterology;  Laterality: N/A;  . DIAGNOSTIC LAPAROSCOPY    . ESOPHAGOGASTRODUODENOSCOPY (EGD) WITH PROPOFOL N/A 12/20/2017   Procedure: ESOPHAGOGASTRODUODENOSCOPY (EGD) WITH PROPOFOL;  Surgeon: [name], MD;  Location: [facility];  Service: Gastroenterology;  Laterality: N/A;  . LAPAROTOMY N/A 08/31/2017   Procedure: EXPLORATORY LAPAROTOMY for SBO, ileocolectomy,  removal of piece of uterine wall;  Surgeon: [name], MD;  Location: [facility] ORS;  Service: General;  Laterality: N/A;  . LASER ABLATION CONDOLAMATA N/A 02/22/2018   Procedure: LASER ABLATION/REMOVAL OF [id number] AROUND ANUS AND VAGINA;  Surgeon: Michael Boston, MD;  Location: [facility];  Service: General;  Laterality: N/A;  . OOPHORECTOMY    . PORTA CATH INSERTION N/A 05/13/2018   Procedure: PORTA CATH  INSERTION;  Surgeon: Katha Cabal, MD;  Location: [facility];  Service: Cardiovascular;  Laterality: N/A;  . SMALL INTESTINE SURGERY    . TANDEM RING INSERTION     x3  . THORACOTOMY    . TOTAL KNEE ARTHROPLASTY Left 04/24/2018   Procedure: TOTAL KNEE ARTHROPLASTY;  Surgeon: Lovell Sheehan, MD;  Location: [facility] ORS;  Service: Orthopedics;  Laterality: Left;    Social History   Socioeconomic History  . Marital status: Legally Separated    Spouse name: Not on file  . Number of children: Not on file  . Years of education: Not on file  . Highest education level: Not on file  Occupational History  . Not on file  Social Needs  . Financial resource strain: Not on file  . Food insecurity:    Worry: Not on file    Inability: Not on file  . Transportation needs:    Medical: Not on file    Non-medical: Not on file  Tobacco Use  . Smoking status: Former Smoker    Last attempt to quit: 10/16/2006    Years since quitting: 11.5  . Smokeless tobacco: Never Used  Substance and Sexual Activity  . Alcohol use: Yes    Frequency: Never    Comment: seldom  . Drug use: Yes    Types: Marijuana  . Sexual activity: Not Currently    Birth control/protection: Post-menopausal    Comment: Not Asked  Lifestyle  . Physical activity:    Days per week: Not on file    Minutes per session: Not on file  . Stress: Not on file  Relationships  . Social connections:    Talks on phone: Not on file    Gets together: Not on file    Attends religious service: Not on file    Active member of club or organization: Not on file    Attends meetings of clubs or organizations: Not on file    Relationship status: Not on file  . Intimate partner violence:    Fear of current or ex partner: Not on file    Emotionally abused: Not on file    Physically abused: Not on file    Forced sexual activity: Not on file  Other Topics Concern  . Not on file  Social History Narrative  . Not on file    Family  History  Problem Relation Age of Onset  . Hypertension Father   . Diabetes Father   . Alcohol abuse Daughter   . Hypertension Maternal Grandmother   . Diabetes Maternal Grandmother   . Diabetes Paternal Grandmother   . Hypertension Paternal Grandmother      Current Outpatient Medications:  .  cholestyramine (QUESTRAN) 4 g packet, Take 1 packet (4 g total) by mouth 3 (three) times daily. (Patient taking differently: Take 4 g by mouth 2 (two) times daily. ), Disp: 270 packet, Rfl: 0 .  cyanocobalamin (,VITAMIN B-12,) 1000 MCG/ML injection, Inject 1ML every week x 4 weeks, then 1ML every other week x 2 months, then monthly  x 3 months (Patient taking differently: Inject 1,000 mcg into the muscle every 30 (thirty) days. Inject 1ML every week x 4 weeks, then 1ML every other week x 2 months, then monthly x 3 months), Disp: 10 mL, Rfl: 1 .  dicyclomine (BENTYL) 10 MG capsule, Take 1 capsule (10 mg total) by mouth 4 (four) times daily -  before meals and at bedtime., Disp: 360 capsule, Rfl: 1 .  fluticasone (FLONASE) 50 MCG/ACT nasal spray, Place 2 sprays into both nostrils daily., Disp: 16 g, Rfl: 0 .  hydrocortisone (ANUSOL-HC) 2.5 % rectal cream, Place 1 application rectally 2 (two) times daily. (Patient not taking: Reported on 05/09/2018), Disp: 30 g, Rfl: 0 .  HYDROmorphone (DILAUDID) 4 MG tablet, Take 4 mg by mouth every 6 (six) hours as needed for severe pain. , Disp: , Rfl:  .  loratadine (CLARITIN) 10 MG tablet, Take 10 mg by mouth daily., Disp: , Rfl:  .  Multiple Vitamin (MULTIVITAMIN WITH MINERALS) TABS tablet, Take 1 tablet daily by mouth., Disp: , Rfl:  .  oxyCODONE 10 MG TABS, Take 1 tablet (10 mg total) by mouth every 4 (four) hours as needed for severe pain (pain score 7-10). (Patient not taking: Reported on 05/06/2018), Disp: 30 tablet, Rfl: 0 .  rivaroxaban (XARELTO) 20 MG TABS tablet, Take 1 tablet (20 mg total) by mouth daily with supper., Disp: 30 tablet, Rfl: 1 .  sertraline  (ZOLOFT) 50 MG tablet, Take 1 tablet (50 mg total) by mouth daily., Disp: 30 tablet, Rfl: 3 .  Syringe/Needle, Disp, (SYRINGE 3CC/22GX1") 22G X 1" 3 ML MISC, For use with Vitamin B12 injections, Disp: 50 each, Rfl: 0 .  VENTOLIN HFA 108 (90 Base) MCG/ACT inhaler, INHALE 1-2 PUFFS BY MOUTH EVERY 4 HOURS AS NEEDED FOR WHEEZING / SHORTNESS OF BREATH, Disp: , Rfl: 0  Physical exam:  Vitals:   05/16/18 1137  BP: 125/74  Pulse: 62  Resp: 18  Temp: (!) 96.7 F (35.9 C)  TempSrc: Tympanic   GENERAL: Patient is a well appearing female in no acute distress.  Unaccompanied HEENT:  Sclerae anicteric. Oropharynx clear and moist.  LUNGS:  Clear to auscultation bilaterally.  No wheezes or rhonchi. HEART:  Regular rate and rhythm. No murmur appreciated. ABDOMEN:  Soft, nontender.  MSK:  No focal spinal tenderness to palpation.  Surgical scar consistent with recent knee replacement healing well SKIN:  Clear with no obvious rashes or skin changes. No nail dyscrasia. NEURO:  Nonfocal. Well oriented.  Appropriate affect.   CMP Latest Ref Rng & Units 04/25/2018  Glucose 70 - 99 mg/dL 89  BUN 6 - 20 mg/dL 14  Creatinine 0.44 - 1.00 mg/dL 0.68  Sodium 135 - 145 mmol/L 138  Potassium 3.5 - 5.1 mmol/L 4.1  Chloride 98 - 111 mmol/L 108  CO2 22 - 32 mmol/L 25  Calcium 8.9 - 10.3 mg/dL 8.3(L)  Total Protein 6.5 - 8.1 g/dL -  Total Bilirubin 0.3 - 1.2 mg/dL -  Alkaline Phos 38 - 126 U/L -  AST 15 - 41 U/L -  ALT 14 - 54 U/L -   CBC Latest Ref Rng & Units 05/06/2018  WBC 3.6 - 11.0 K/uL 3.5(L)  Hemoglobin 12.0 - 16.0 g/dL 11.6(L)  Hematocrit 35.0 - 47.0 % 33.4(L)  Platelets 150 - 440 K/uL 294    No images are attached to the encounter.  Nm Pet Image Initial (pi) Skull Base To Thigh  Result Date: 04/23/2018 CLINICAL DATA:  Subsequent  treatment strategy for cervical cancer. Multiple pulmonary nodules. EXAM: NUCLEAR MEDICINE PET SKULL BASE TO THIGH TECHNIQUE: 8.3 mCi F-18 FDG was injected  intravenously. Full-ring PET imaging was performed from the skull base to thigh after the radiotracer. CT data was obtained and used for attenuation correction and anatomic localization. Fasting blood glucose: 80 mg/dl COMPARISON:  Multiple exams, including CT scan of 04/15/2018 and outside PET-CT from [facility] dated 11/08/2017 FINDINGS: Mediastinal blood pool activity: SUV max 2.5 NECK: No significant abnormal hypermetabolic activity in this region. Incidental CT findings: Mild chronic right maxillary sinusitis. CHEST: Roughly similar appearance of innumerable solid and cavitary nodules scattered in both lungs. When I compare back to the prior PET-CT from 11/08/2017 these nodules have clearly increased in size and have also had some increase in activity, although hypermetabolic activity remains relatively low-grade. For example, an index nodule in the left upper lobe on image 83/4 appears solid and measures 1.1 by 0.9 cm, previously 0.6 by 0.7 cm, with maximum standard uptake value 1.9, formerly 0.9. A 7 mm nodule in the right lower lobe has a maximum SUV of 2.3. Other nodules likewise have low-grade metabolic activity. Most of the nodules or at or below 1 cm in diameter. No enlarged or hypermetabolic lymph nodes in the chest. Incidental CT findings: Aortic arch atherosclerotic vascular disease. ABDOMEN/PELVIS: Physiologic accentuation of activity in segments of bowel. This primarily involves the colon. No appreciable uterine hypermetabolic activity. Upper normal sized porta hepatis lymph nodes are not hypermetabolic. No pathologic retroperitoneal or pelvic adenopathy. Incidental CT findings: Cholecystectomy. Aortoiliac atherosclerotic vascular disease. Postoperative findings in the right colon. Presacral stranding, likely due to prior radiation therapy. SKELETON: No significant abnormal hypermetabolic activity in this region. Incidental CT findings: Dental cavities noted. IMPRESSION: 1.  The numerous scattered solid and cavitary nodules in the lungs are essentially stable from 04/15/2018 but have increased in size compared to the prior PET-CT from 11/08/2017. These demonstrate low-grade metabolic activity up to a maximum SUV of about 2.3, which is also increased from 11/08/2017. Appearance favors cavitary malignancy over infectious processes such as fungal disease. 2. Post therapy related findings in the pelvis. 3. Other imaging findings of potential clinical significance: Mild chronic right maxillary sinusitis. Aortic Atherosclerosis (ICD10-I70.0). Dental cavities. Electronically Signed   By: Van Clines M.D.   On: 04/23/2018 11:55   US Venous Img Lower Unilateral Right  Result Date: 04/17/2018 CLINICAL DATA:  Pain x2 weeks EXAM: RIGHT LOWER EXTREMITY VENOUS DOPPLER ULTRASOUND TECHNIQUE: Gray-scale sonography with compression, as well as color and duplex ultrasound, were performed to evaluate the deep venous system from the level of the common femoral vein through the popliteal and proximal calf veins. COMPARISON:  None FINDINGS: Normal compressibility of the common femoral, superficial femoral, and popliteal veins, as well as the proximal calf veins. No filling defects to suggest DVT on grayscale or color Doppler imaging. Doppler waveforms show normal direction of venous flow, normal respiratory phasicity and response to augmentation. Survey views of the contralateral common femoral vein are unremarkable. IMPRESSION: No evidence of right lower extremity deep vein thrombosis. Electronically Signed   By: Lucrezia Europe M.D.   On: 04/17/2018 14:30   Ct Biopsy  Result Date: 05/06/2018 INDICATION: History of cervical cancer now with multiple bilateral hypermetabolic pulmonary nodules. Please perform CT-guided biopsy for tissue diagnostic purposes. EXAM: CT GUIDED LEFT UPPER LOBE PULMONARY NODULE BIOPSY COMPARISON:  PET-CT - 04/23/2018 MEDICATIONS: None. ANESTHESIA/SEDATION: Fentanyl 100 mcg  IV; Versed 2 mg IV  Sedation time: 25 minutes; The patient was continuously monitored during the procedure by the interventional radiology nurse under my direct supervision. CONTRAST:  None COMPLICATIONS: None immediate. PROCEDURE: Informed consent was obtained from the patient following an explanation of the procedure, risks, benefits and alternatives. The patient understands,agrees and consents for the procedure. All questions were addressed. A time out was performed prior to the initiation of the procedure. The patient was positioned supine on the CT table and a limited chest CT was performed for procedural planning demonstrating unchanged size and appearance of the approximately 1.0 x 0.9 cm solid left upper lobe pulmonary nodule (image 16, series 2). Note, nodule was targeted for biopsy given patient's recent postoperative state of left total knee replacement and therefore her difficulty lying various obliquities on the CT gantry. The operative site was prepped and draped in the usual sterile fashion. Under sterile conditions and local anesthesia, a 17 gauge coaxial needle was advanced into the peripheral aspect of the nodule. Positioning was confirmed with intermittent CT fluoroscopy and followed by the acquisition of 3 core needle biopsies with an 18 gauge core needle biopsy device. The coaxial needle was removed following deployment of a [employer] plug and superficial hemostasis was achieved with manual compression. Limited post procedural chest CT was negative for pneumothorax or additional complication. A dressing was placed. The patient tolerated the procedure well without immediate postprocedural complication. The patient was escorted to have an upright chest radiograph. IMPRESSION: Technically successful CT guided core needle core biopsy of hypermetabolic left upper lobe pulmonary nodule. Electronically Signed   By: Sandi Mariscal M.D.   On: 05/06/2018 12:01   Dg Chest Port 1 View  Result Date:  05/06/2018 CLINICAL DATA:  History of cervical cancer, now with multiple bilateral hypermetabolic pulmonary nodules worrisome for metastatic disease post CT-guided left upper lobe pulmonary nodule biopsy EXAM: PORTABLE CHEST 1 VIEW COMPARISON:  CT-guided left upper lobe pulmonary nodule biopsy-earlier same day FINDINGS: Normal cardiac silhouette and mediastinal contours. Multiple ill-defined nodules are seen bilaterally. No pleural effusion or pneumothorax. No discrete focal airspace opacities. No evidence of edema. No acute osseus abnormalities. IMPRESSION: No evidence of complication following left upper lobe pulmonary nodule biopsy. Specifically, no evidence of pleural effusion or pneumothorax. Electronically Signed   By: Sandi Mariscal M.D.   On: 05/06/2018 11:58   Dg Knee Left Port  Result Date: 04/24/2018 CLINICAL DATA:  Status post left total knee joint prosthesis placement. EXAM: PORTABLE LEFT KNEE - 1-2 VIEW COMPARISON:  MRI of the left knee of Feb 26, 2018 FINDINGS: The patient has undergone total knee joint prosthesis placement. Radiographic positioning of the prosthetic components is good. The interface with the native bone appears normal. There is no acute native bone fracture. Surgical skin staples are present. IMPRESSION: No immediate postprocedure complication following left total knee joint prosthesis placement. Electronically Signed   By: David  Martinique M.D.   On: 04/24/2018 12:54     Assessment and plan- Patient is a 52 y.o. female with cervical cancer who presents to chemo care clinic for initial meeting in preparation of starting chemotherapy   1. Cervical cancer.-Diagnosed with recurrent stage IB1 cervical adenocarcinoma s/p aborted radical hysterectomy due to infected adnexa 2/18 at [facility].  She then had chemoradiation at [facility] with apparent complete response.  Developed bowel obstruction and had resection of terminal ileum and cecum on 08/2017 at [facility].  Pat on 04/23/2018 revealed numerous  solid and cavitary lung nodules increasing in size.  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  Port placed on 05/13/2018 for administration of chemotherapy.  Followed by Dr. Mike Gip with medical oncology and Drs Theora Gianotti and [facility] with gynecology-oncology.  She attended chemotherapy education class today.  Plan to proceed with carbotaxol with repeat imaging after 3 cycles.   2. Chemo Care Clinic/High Risk for ER/Hospitalization during chemotherapy- We discussed the role of the chemo care clinic and identified patient specific risk factors. I discussed that patient was identified as high risk primarily based on: For recent hospital admissions, to recent ER visits, Medicaid status, has anemia, has CHF, has connective tissue disorder, has liver disease, has PVD.  She previously had Medicaid through [place] in multiple barriers to care in having insurance approved care that was received in [place].  She is now transitioned to [facility] of [place].  She reports good support system at home who assists her with finances, housing, transportation, and affording her medications.  She is not able to work currently.  She has established primary care with Dr. Sanda Klein, whom she sees regularly.  We discussed the role of the symptom management clinic at [facility] and methods of contacting clinic and providers versus oncology versus primary care.  She denies needing specific assistance at this time. She continues to be followed by Mariea Clonts, nurse navigator.   rtc as scheduled for consideration of initiation of carbo-taxol with Dr. Mike Gip.   Visit Diagnosis 1. Malignant neoplasm of overlapping sites of cervix [facility])    Patient expressed understanding and was in agreement with this plan. She also understands that She can call clinic at any time with any questions, concerns, or complaints.   A total  of (15) minutes of face-to-face time was spent with this patient with greater than 50% of that time in counseling and care-coordination.  Beckey Rutter, DNP, AGNP-C [place] at [facility] [phone] (work cell) [phone] (office)

## 2018-05-16 NOTE — Progress Notes (Signed)
Verified that foundation testing has been sent. Results pending. Oncology Nurse Navigator Documentation  Navigator Location: [facility] (05/16/18 0900)   )Navigator Encounter Type: Other (05/16/18 0900)                                                    Time Spent with Patient: 15 (05/16/18 0900)

## 2018-05-20 ENCOUNTER — Other Ambulatory Visit: Payer: Self-pay | Admitting: *Deleted

## 2018-05-20 MED ORDER — LIDOCAINE-PRILOCAINE 2.5-2.5 % EX CREA: 1.0000 "application " | TOPICAL_CREAM | CUTANEOUS | 3 refills | Status: DC | PRN

## 2018-05-20 NOTE — Telephone Encounter (Signed)
Patient states she went to pharmacy to get her EMLA Cream and there is no prescription for it there. She requests this get sent to [facility].

## 2018-05-23 ENCOUNTER — Encounter: Payer: Self-pay | Admitting: Oncology

## 2018-05-23 ENCOUNTER — Other Ambulatory Visit: Payer: Self-pay | Admitting: Oncology

## 2018-05-23 ENCOUNTER — Telehealth: Payer: Self-pay | Admitting: *Deleted

## 2018-05-23 ENCOUNTER — Ambulatory Visit
Admission: RE | Admit: 2018-05-23 | Discharge: 2018-05-23 | Disposition: A | Discharge status: Home / Self Care | Payer: Medicaid Other | Source: Ambulatory Visit | Attending: Oncology | Admitting: Oncology

## 2018-05-23 ENCOUNTER — Other Ambulatory Visit: Payer: Self-pay

## 2018-05-23 ENCOUNTER — Other Ambulatory Visit: Payer: Self-pay | Admitting: *Deleted

## 2018-05-23 ENCOUNTER — Inpatient Hospital Stay (HOSPITAL_BASED_OUTPATIENT_CLINIC_OR_DEPARTMENT_OTHER): Payer: Medicaid Other | Admitting: Oncology

## 2018-05-23 ENCOUNTER — Inpatient Hospital Stay: Payer: Medicaid Other

## 2018-05-23 VITALS — BP 109/74 | HR 80 | Temp 97.1°F | Resp 20

## 2018-05-23 DIAGNOSIS — C530 Malignant neoplasm of endocervix: Secondary | ICD-10-CM

## 2018-05-23 DIAGNOSIS — R197 Diarrhea, unspecified: Secondary | ICD-10-CM

## 2018-05-23 DIAGNOSIS — C7802 Secondary malignant neoplasm of left lung: Secondary | ICD-10-CM

## 2018-05-23 DIAGNOSIS — K529 Noninfective gastroenteritis and colitis, unspecified: Secondary | ICD-10-CM

## 2018-05-23 DIAGNOSIS — C538 Malignant neoplasm of overlapping sites of cervix uteri: Secondary | ICD-10-CM | POA: Insufficient documentation

## 2018-05-23 DIAGNOSIS — Z95828 Presence of other vascular implants and grafts: Secondary | ICD-10-CM

## 2018-05-23 DIAGNOSIS — R1011 Right upper quadrant pain: Secondary | ICD-10-CM

## 2018-05-23 DIAGNOSIS — Z9889 Other specified postprocedural states: Secondary | ICD-10-CM | POA: Diagnosis not present

## 2018-05-23 DIAGNOSIS — E860 Dehydration: Secondary | ICD-10-CM

## 2018-05-23 DIAGNOSIS — R112 Nausea with vomiting, unspecified: Secondary | ICD-10-CM

## 2018-05-23 DIAGNOSIS — Z5111 Encounter for antineoplastic chemotherapy: Secondary | ICD-10-CM | POA: Diagnosis not present

## 2018-05-23 DIAGNOSIS — R1033 Periumbilical pain: Secondary | ICD-10-CM

## 2018-05-23 DIAGNOSIS — N200 Calculus of kidney: Secondary | ICD-10-CM | POA: Insufficient documentation

## 2018-05-23 DIAGNOSIS — E871 Hypo-osmolality and hyponatremia: Secondary | ICD-10-CM

## 2018-05-23 DIAGNOSIS — Z79899 Other long term (current) drug therapy: Secondary | ICD-10-CM

## 2018-05-23 DIAGNOSIS — Z923 Personal history of irradiation: Secondary | ICD-10-CM

## 2018-05-23 DIAGNOSIS — R110 Nausea: Secondary | ICD-10-CM

## 2018-05-23 LAB — COMPREHENSIVE METABOLIC PANEL
ALK PHOS: 173 U/L — AB (ref 38–126)
ALT: 59 U/L — ABNORMAL HIGH (ref 0–44)
ANION GAP: 10 (ref 5–15)
AST: 52 U/L — ABNORMAL HIGH (ref 15–41)
Albumin: 4 g/dL (ref 3.5–5.0)
BILIRUBIN TOTAL: 0.6 mg/dL (ref 0.3–1.2)
BUN: 15 mg/dL (ref 6–20)
CALCIUM: 9.6 mg/dL (ref 8.9–10.3)
CO2: 23 mmol/L (ref 22–32)
Chloride: 100 mmol/L (ref 98–111)
Creatinine, Ser: 0.78 mg/dL (ref 0.44–1.00)
GFR calc non Af Amer: >60 mL/min (ref >60)
Glucose, Bld: 96 mg/dL (ref 70–99)
Potassium: 3.7 mmol/L (ref 3.5–5.1)
SODIUM: 133 mmol/L — AB (ref 135–145)
TOTAL PROTEIN: 8.4 g/dL — AB (ref 6.5–8.1)

## 2018-05-23 LAB — URINALYSIS, COMPLETE (UACMP) WITH MICROSCOPIC
Bacteria, UA: NONE SEEN
Bilirubin Urine: NEGATIVE
GLUCOSE, UA: NEGATIVE mg/dL
HGB URINE DIPSTICK: NEGATIVE
Ketones, ur: NEGATIVE mg/dL
Leukocytes, UA: NEGATIVE
NITRITE: NEGATIVE
Protein, ur: NEGATIVE mg/dL
SPECIFIC GRAVITY, URINE: 1.01 (ref 1.005–1.030)
Squamous Epithelial / LPF: NONE SEEN (ref 0–5)
pH: 6 (ref 5.0–8.0)

## 2018-05-23 LAB — CBC WITH DIFFERENTIAL/PLATELET
BASOS ABS: 0 10*3/uL (ref 0–0.1)
BASOS PCT: 0 %
EOS ABS: 0 10*3/uL (ref 0–0.7)
Eosinophils Relative: 1 %
HCT: 37.5 % (ref 35.0–47.0)
Hemoglobin: 12.7 g/dL (ref 12.0–16.0)
Lymphocytes Relative: 18 %
Lymphs Abs: 0.8 10*3/uL — ABNORMAL LOW (ref 1.0–3.6)
MCH: 33.6 pg (ref 26.0–34.0)
MCHC: 33.9 g/dL (ref 32.0–36.0)
MCV: 99.2 fL (ref 80.0–100.0)
Monocytes Absolute: 0.2 10*3/uL (ref 0.2–0.9)
Monocytes Relative: 4 %
Neutro Abs: 3.6 10*3/uL (ref 1.4–6.5)
Neutrophils Relative %: 77 %
PLATELETS: 205 10*3/uL (ref 150–440)
RBC: 3.78 MIL/uL — ABNORMAL LOW (ref 3.80–5.20)
RDW: 13.2 % (ref 11.5–14.5)
WBC: 4.7 10*3/uL (ref 3.6–11.0)

## 2018-05-23 LAB — MAGNESIUM: Magnesium: 1.5 mg/dL — ABNORMAL LOW (ref 1.7–2.4)

## 2018-05-23 MED ORDER — ONDANSETRON HCL 4 MG/2ML IJ SOLN
8.0000 mg | Freq: Once | INTRAMUSCULAR | Status: AC
  Administered 2018-05-23: 8 mg via INTRAVENOUS
  Filled 2018-05-23: qty 4

## 2018-05-23 MED ORDER — MAGNESIUM SULFATE 2 GM/50ML IV SOLN
2.0000 g | Freq: Once | INTRAVENOUS | Status: AC
  Administered 2018-05-23: 2 g via INTRAVENOUS
  Filled 2018-05-23: qty 50

## 2018-05-23 MED ORDER — DICYCLOMINE HCL 20 MG PO TABS: 20.0000 mg | ORAL_TABLET | Freq: Four times a day (QID) | ORAL | 0 refills | Status: DC

## 2018-05-23 MED ORDER — SODIUM CHLORIDE 0.9% FLUSH
10.0000 mL | INTRAVENOUS | Status: DC | PRN
  Administered 2018-05-23: 10 mL via INTRAVENOUS
  Filled 2018-05-23: qty 10

## 2018-05-23 MED ORDER — METRONIDAZOLE 500 MG PO TABS: 500.0000 mg | ORAL_TABLET | Freq: Three times a day (TID) | ORAL | 0 refills | Status: DC

## 2018-05-23 MED ORDER — SODIUM CHLORIDE 0.9 % IV SOLN: INTRAVENOUS | Status: DC

## 2018-05-23 MED ORDER — IOHEXOL 300 MG/ML  SOLN
100.0000 mL | Freq: Once | INTRAMUSCULAR | Status: AC | PRN
  Administered 2018-05-23: 100 mL via INTRAVENOUS

## 2018-05-23 MED ORDER — HEPARIN SOD (PORK) LOCK FLUSH 100 UNIT/ML IV SOLN
500.0000 [IU] | Freq: Once | INTRAVENOUS | Status: AC
  Administered 2018-05-23: 500 [IU] via INTRAVENOUS

## 2018-05-23 MED ORDER — SODIUM CHLORIDE 0.9 % IV SOLN
Freq: Once | INTRAVENOUS | Status: AC
  Administered 2018-05-23: 13:00:00 via INTRAVENOUS
  Filled 2018-05-23: qty 1000

## 2018-05-23 MED ORDER — CIPROFLOXACIN HCL 500 MG PO TABS: 500.0000 mg | ORAL_TABLET | Freq: Two times a day (BID) | ORAL | 0 refills | Status: DC

## 2018-05-23 MED ORDER — SODIUM CHLORIDE 0.9 % IV SOLN: Freq: Once | INTRAVENOUS | Status: DC

## 2018-05-23 MED ORDER — DEXAMETHASONE SODIUM PHOSPHATE 10 MG/ML IJ SOLN
10.0000 mg | Freq: Once | INTRAMUSCULAR | Status: AC
  Administered 2018-05-23: 10 mg via INTRAVENOUS
  Filled 2018-05-23: qty 1

## 2018-05-23 NOTE — Telephone Encounter (Signed)
Patient called and reports that she has been having n/v/ diarrhea, cramps and now has right sided pain radiating into her back. Please advise

## 2018-05-23 NOTE — Progress Notes (Signed)
Symptom Management Consult note [facility]  Telephone:([phone]) [phone] [phone]  Patient Care Team: Arnetha Courser, MD as PCP - General (Family Medicine) Mellody Drown, MD as Consulting Physician (Obstetrics and Gynecology) Lenor Coffin, MD as Attending Physician (Gynecology) Lequita Asal, MD as Medical Oncologist (Hematology and Oncology) [name], MD as Consulting Physician (Gastroenterology) Michael Boston, MD as Consulting Physician (General Surgery) Lovell Sheehan, MD as Consulting Physician (Orthopedic Surgery) Clent Jacks, RN as Registered Nurse   Name of the patient: Joanna Hall  [id number]  1965-11-18   Date of visit: 05/23/18  Diagnosis- stage I B 1 cervical cancer  Chief complaint/ Reason for visit- Abdominal pain  Heme/Onc history: She was last seen by Dr. Mike Gip and Dr. Fransisca Connors on 05/15/2018 where she was scheduled to start carbo/taxol on 05/28/18.  They plan to reassess with imaging after approximately 3 cycles.  Plan to check PDL 1 and Foundation 1 for additional tx options.  At that visit she was doing well and ambulating well using a rolling walker. Recovering well from recent knee replacement surgery.  Had follow-up with Dr. [name] regarding skin suture staples.   Oncology History   The patient was diagnosed with cervical adenocarcinoma in [place] in 09/2016. She has had a long standing history of abnormal PAP smears. She presented with an ovarian cyst. Laparoscopic surgery was difficult secondary to scar tissue. Had BSO and rupture of cyst with purulent drainage into abdomen. Had Stage IB1 (4 cm) endocervical adenocarcinoma. Radical hysterectomy aborted.   She was treated by Dr Christene Slates at [facility] in [place], [place]. Decision was made to pursue concurrent chemotherapy (weekly cisplatin) and radiation. She received treatment from 11/2016 -  05/2017. 01/2017 cisplatin x 2 and Carboplatin x 1 (01/29/17) due to ARF and XRT. XRT followed by T &O on 02/01/17 and T &N 4.28/18 and 02/20/17 . Course was complicated by weight loss (80 pounds), nausea, vomiting, electrolyte wasting (potassium and magnesium). She describes being "sick constantly" and requiring at least 20 hospitalizations.   She underwent follow-up chest CT then PET scan in 06/2017. She states that "the radiation worked" and there was no disease in the abdomen. She was noted to have lung nodules that were growing. She was scheduled to have follow-up imaging in 10/2017.  She was admitted in [place] with a small bowel obstruction. She was managed conservatively. She was home for about a week then traveled to [place] for Thanksgiving holiday (she has family here). She presented on 08/27/2017 with nausea, vomiting, and lower abdominal pain. Symptoms did not respond to conservative measurement.   Preoperative CT on 08/26/2017 revealed SBO with transition in the pelvis just superior to the uterus where there was a long segment of distal ileum with fatty wall thickening compatible with chronic inflammation and/or radiation enteritis. She underwent laparotomy and right ileocolectomy on 08/31/2017 at [facility]. There were numerous pulmonary nodules c/w metastatic disease. Surgical findings revealed a thickened, matted, and scarred piece of distal small bowel close to the ileocecal valve. Diet was slowly advanced. She was discharged on 09/05/2017.  Abdominal and pelvic CT without contrast on 09/11/2017 revealed debris within anterior abdominal wall incision concerning for infection, versus packing material.She is s/pileocolectomy with expected postoperative changes and mild colonic ileus. There were numerous pulmonary nodules highly concerning for metastatic diseaseand punctate nonobstructing nephrolithiasis. She was readmitted on 09/12/2017. She  describes the onset of lower abdominal pain on  09/09/2017. Pain markedly increased in intensity on 09/11/2017. Staples were removed and the wound packed. She was started on doxycycline.  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  PET scan ordered to evaluate enlarging lung nodules with concern for recurrent cervical cancer. Scan was delayed d/t insurance and had to be done in [facility] [place].    On 12/03/2017 she presented to ER for abdominal pain and emesis. Imaging revealed:  12/03/17- CT A/P:continued multiple pulmonary nodules inthelung bases concerning for metastatic disease. There was mild wall thickening of rectosigmoid colon is noted.There was a stable 1.8 x 1.6 cm mixed fat and soft tissue densitynoted in right lower quadrant which may represent fat necrosis.There was a larger low density is noted involving uterine fundus.   12/03/2017: Pelvic MRIwas unremarkable.   04/15/2018: Chest, abdomen, and pelvis CTrevealed innumerable (>100) cavitary nodules scattered in the lungs, moderately enlarging compared to the 11/08/2017 PET-CT, suspicious for metastatic disease. One index node in the RLL measures 1.0 x 1.1 cm (previously 0.6 x 0.6 cm). There were no new nodules. There was an ill-defined wall thickening in the rectosigmoid with surrounding stranding along fascia planes, probably sequela from prior radiation therapy. There was multilevel lumbar impingement due to spondylosis and degenerative disc disease. There was heterogeneous enhancement in the uterus (some possibly from prior radiation  therapy).  04/23/2018:PET scan revealednumerous scattered solid and cavitary nodules in the lungs stable increased in size compared to the prior PET-CT from 11/08/2017.Largest nodule was 1.1 cm in the LUL (SUV 1.9).These demonstratedlow-grade metabolic activity up to a maximum SUV of about 2.3, increased from 11/08/2017.  Case was discussed at tumor board on 04/25/2018. Consensus to pursue CT-guided biopsy.   On 05/06/2018 she underwent CT-guided biopsy of left upper lobe pulmonary nodule.  Pathology:Metastatic adenocarcinoma morphologically consistent with cervical adenocarcinoma.  She denies any history of thrombosis. She denies any family history of thrombosis. She stopped smoking 10 years ago (1/4 ppd) and has a history of thoracotomy. She denies any family history of rheumatologic problems/vasculitis/autoimmune disorders. Thrombosis work up showed she is heterozygous for Factor V Leiden and taking Xarelto.  Patient haddiarrhea despite Lomotil and Imodium with 15 watery BMs per day. Dr. Hampton Abbot who did the bowel resection managing. She has history of chronic hep C which is managed by GI. Hepatitis C genotype is 2a/2c.She received B12 injections for history of B12 deficiency. On 04/24/2018 she underwent left total knee replacement with Dr. [name].     Malignant neoplasm of overlapping sites of cervix ([place])   09/18/2017 Initial Diagnosis    Malignant neoplasm of overlapping sites of cervix ([place])    05/15/2018 -  Chemotherapy    The patient had palonosetron (ALOXI) injection 0.25 mg, 0.25 mg, Intravenous,  Once, 0 of 6 cycles pegfilgrastim (NEULASTA) injection 6 mg, 6 mg, Subcutaneous, Once, 0 of 6 cycles CARBOplatin (PARAPLATIN) in sodium chloride 0.9 % 100 mL chemo infusion, , Intravenous,  Once, 0 of 6 cycles PACLitaxel (TAXOL) 330 mg in sodium chloride 0.9 % 500 mL chemo infusion (> 58m/m2), 175 mg/m2, Intravenous,  Once, 0 of 6 cycles  for chemotherapy treatment.       Interval history-  Patient complains of abdominal pain and diarrhea.  The pain is described as aching and stabbing, and is 8/10 in intensity. Pain is located in the LLQ, suprapubic without radiation. Onset was 4 days ago. Symptoms have been gradually worsening since. Aggravating factors: eating and movement.  Alleviating factors: pain meds. Associated symptoms: anorexia, diarrhea, nausea and vomiting. The patient denies constipation, fever and myalgias.  Patient complains of diarrhea. Onset of diarrhea was 4 days ago. Diarrhea is occurring approximately 6 times per day. Patient describes diarrhea as watery. Diarrhea has been associated with abdominal pain described as throbbing. Patient denies blood in stool, fever, recent antibiotic use.  Previous visits for diarrhea: none. Evaluation to date: none. Treatment to date: None.   ECOG FS:1 - Symptomatic but completely ambulatory  Review of systems- Review of Systems  Constitutional: Positive for malaise/fatigue and weight loss. Negative for chills and fever.  HENT: Negative for congestion and ear pain.   Eyes: Negative.  Negative for blurred vision and double vision.  Respiratory: Negative.  Negative for cough, sputum production and shortness of breath.   Cardiovascular: Negative.  Negative for chest pain, palpitations and leg swelling.  Gastrointestinal: Positive for abdominal pain, diarrhea, nausea and vomiting. Negative for constipation.  Genitourinary: Negative for dysuria, frequency and urgency.  Musculoskeletal: Negative for back pain, falls and myalgias.  Skin: Negative.  Negative for rash.  Neurological: Negative.  Negative for weakness and headaches.  Endo/Heme/Allergies: Negative.  Does not bruise/bleed easily.  Psychiatric/Behavioral: Negative.  Negative for depression. The patient is not nervous/anxious and does not have insomnia.      Current treatment- scheduled to start Carbo/Taxol next week.  Allergies  Allergen Reactions   . Ketamine Anxiety and Other (See Comments)    Syncope episode/confusion      Past Medical History:  Diagnosis Date  . Anxiety   . Aortic atherosclerosis ([place])   . Arthritis    neck and knees  . 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 Blood transfusion without reported diagnosis   . Cervical cancer (HCC)    mets lung  . Chronic anal fissure   . Chronic diarrhea   . Dyspnea   . Factor V Leiden mutation ([place])   . Fecal incontinence   . Genital warts   . GERD (gastroesophageal reflux disease)   . Heart murmur   . Hemorrhoids   . 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 History of cancer chemotherapy    completed 06/2017  . History of Clostridium difficile infection    while undergoing chemo.  Negative test 10/2017  . 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 Macrocytic anemia with vitamin B12 deficiency   . Perianal condylomata   . Pneumonia    History of  . Pulmonary nodules   . Small bowel obstruction ([place]) 08/2017  . Stiff neck    limited right turn  . Vitamin D deficiency      Past Surgical History:  Procedure Laterality Date  . CHOLECYSTECTOMY    . COLON SURGERY  08/2017   resection  . COLONOSCOPY WITH PROPOFOL N/A 12/20/2017   Procedure: COLONOSCOPY WITH PROPOFOL;  Surgeon: [name], MD;  Location: [facility] ENDOSCOPY;  Service: Gastroenterology;  Laterality: N/A;  . DIAGNOSTIC LAPAROSCOPY    . ESOPHAGOGASTRODUODENOSCOPY (EGD) WITH PROPOFOL N/A 12/20/2017   Procedure: ESOPHAGOGASTRODUODENOSCOPY (EGD) WITH PROPOFOL;  Surgeon: [name], MD;  Location: [facility];  Service: Gastroenterology;  Laterality:  N/A;  . LAPAROTOMY N/A 08/31/2017   Procedure: EXPLORATORY LAPAROTOMY for SBO, ileocolectomy, removal of piece of uterine wall;  Surgeon: [name], MD;  Location: [facility] ORS;  Service: General;  Laterality: N/A;  . LASER ABLATION CONDOLAMATA N/A 02/22/2018   Procedure: LASER  ABLATION/REMOVAL OF [id number] AROUND ANUS AND VAGINA;  Surgeon: Michael Boston, MD;  Location: [facility];  Service: General;  Laterality: N/A;  . OOPHORECTOMY    . PORTA CATH INSERTION N/A 05/13/2018   Procedure: PORTA CATH INSERTION;  Surgeon: Katha Cabal, MD;  Location: [facility];  Service: Cardiovascular;  Laterality: N/A;  . SMALL INTESTINE SURGERY    . TANDEM RING INSERTION     x3  . THORACOTOMY    . TOTAL KNEE ARTHROPLASTY Left 04/24/2018   Procedure: TOTAL KNEE ARTHROPLASTY;  Surgeon: Lovell Sheehan, MD;  Location: [facility] ORS;  Service: Orthopedics;  Laterality: Left;    Social History   Socioeconomic History  . Marital status: Legally Separated    Spouse name: Not on file  . Number of children: Not on file  . Years of education: Not on file  . Highest education level: Not on file  Occupational History  . Not on file  Social Needs  . Financial resource strain: Not on file  . Food insecurity:    Worry: Not on file    Inability: Not on file  . Transportation needs:    Medical: Not on file    Non-medical: Not on file  Tobacco Use  . Smoking status: Former Smoker    Last attempt to quit: 10/16/2006    Years since quitting: 11.6  . Smokeless tobacco: Never Used  Substance and Sexual Activity  . Alcohol use: Yes    Frequency: Never    Comment: seldom  . Drug use: Yes    Types: Marijuana  . Sexual activity: Not Currently    Birth control/protection: Post-menopausal    Comment: Not Asked  Lifestyle  . Physical activity:    Days per week: Not on file    Minutes per session: Not on file  . Stress: Not on file  Relationships  . Social connections:    Talks on phone: Not on file    Gets together: Not on file    Attends religious service: Not on file    Active member of club or organization: Not on file    Attends meetings of clubs or organizations: Not on file    Relationship status: Not on file  . Intimate partner violence:    Fear  of current or ex partner: Not on file    Emotionally abused: Not on file    Physically abused: Not on file    Forced sexual activity: Not on file  Other Topics Concern  . Not on file  Social History Narrative  . Not on file    Family History  Problem Relation Age of Onset  . Hypertension Father   . Diabetes Father   . Alcohol abuse Daughter   . Hypertension Maternal Grandmother   . Diabetes Maternal Grandmother   . Diabetes Paternal Grandmother   . Hypertension Paternal Grandmother      Current Outpatient Medications:  .  cholestyramine (QUESTRAN) 4 g packet, Take 1 packet (4 g total) by mouth 3 (three) times daily. (Patient taking differently: Take 4 g by mouth 2 (two) times daily. ), Disp: 270 packet, Rfl: 0 .  cyanocobalamin (,VITAMIN B-12,) 1000 MCG/ML injection, Inject 1ML every  week x 4 weeks, then 1ML every other week x 2 months, then monthly x 3 months (Patient taking differently: Inject 1,000 mcg into the muscle every 30 (thirty) days. Inject 1ML every week x 4 weeks, then 1ML every other week x 2 months, then monthly x 3 months), Disp: 10 mL, Rfl: 1 .  dicyclomine (BENTYL) 10 MG capsule, Take 1 capsule (10 mg total) by mouth 4 (four) times daily -  before meals and at bedtime., Disp: 360 capsule, Rfl: 1 .  fluticasone (FLONASE) 50 MCG/ACT nasal spray, Place 2 sprays into both nostrils daily., Disp: 16 g, Rfl: 0 .  HYDROmorphone (DILAUDID) 4 MG tablet, Take 4 mg by mouth every 6 (six) hours as needed for severe pain. , Disp: , Rfl:  .  loratadine (CLARITIN) 10 MG tablet, Take 10 mg by mouth daily., Disp: , Rfl:  .  rivaroxaban (XARELTO) 20 MG TABS tablet, Take 1 tablet (20 mg total) by mouth daily with supper., Disp: 30 tablet, Rfl: 1 .  sertraline (ZOLOFT) 50 MG tablet, Take 1 tablet (50 mg total) by mouth daily., Disp: 30 tablet, Rfl: 3 .  Syringe/Needle, Disp, (SYRINGE 3CC/22GX1") 22G X 1" 3 ML MISC, For use with Vitamin B12 injections, Disp: 50 each, Rfl: 0 .  VENTOLIN  HFA 108 (90 Base) MCG/ACT inhaler, INHALE 1-2 PUFFS BY MOUTH EVERY 4 HOURS AS NEEDED FOR WHEEZING / SHORTNESS OF BREATH, Disp: , Rfl: 0 .  ciprofloxacin (CIPRO) 500 MG tablet, Take 1 tablet (500 mg total) by mouth 2 (two) times daily., Disp: 14 tablet, Rfl: 0 .  dicyclomine (BENTYL) 20 MG tablet, Take 1 tablet (20 mg total) by mouth every 6 (six) hours., Disp: 20 tablet, Rfl: 0 .  lidocaine-prilocaine (EMLA) cream, Apply 1 application topically as needed. Apply small amount to port site at least 1 hour prior to it being accessed, cover with plastic wrap (Patient not taking: Reported on 05/23/2018), Disp: 30 g, Rfl: 3 .  metroNIDAZOLE (FLAGYL) 500 MG tablet, Take 1 tablet (500 mg total) by mouth 3 (three) times daily., Disp: 21 tablet, Rfl: 0 .  Multiple Vitamin (MULTIVITAMIN WITH MINERALS) TABS tablet, Take 1 tablet daily by mouth., Disp: , Rfl:  .  ondansetron (ZOFRAN) 8 MG tablet, Take 1 tablet (8 mg total) by mouth every 8 (eight) hours as needed for nausea or vomiting., Disp: 20 tablet, Rfl: 0 .  oxyCODONE 10 MG TABS, Take 1 tablet (10 mg total) by mouth every 4 (four) hours as needed for severe pain (pain score 7-10). (Patient not taking: Reported on 05/06/2018), Disp: 30 tablet, Rfl: 0 No current facility-administered medications for this visit.   Facility-Administered Medications Ordered in Other Visits:  .  sodium chloride flush (NS) 0.9 % injection 10 mL, 10 mL, Intravenous, PRN, Faythe Casa E, NP, 10 mL at 05/23/18 1225  Physical exam:  Vitals:   05/23/18 1201  BP: 109/74  Pulse: 80  Resp: 20  Temp: (!) 97.1 F (36.2 C)  TempSrc: Tympanic  SpO2: 99%   Physical Exam  Constitutional: She is oriented to person, place, and time. Vital signs are normal. She appears well-developed and well-nourished.  HENT:  Head: Normocephalic and atraumatic.  Eyes: Pupils are equal, round, and reactive to light.  Neck: Normal range of motion.  Cardiovascular: Normal rate, regular rhythm and  normal heart sounds.  No murmur heard. Pulmonary/Chest: Effort normal and breath sounds normal. She has no wheezes.  Abdominal: Soft. Normal appearance and bowel sounds are normal. She  exhibits no distension. There is no hepatosplenomegaly, splenomegaly or hepatomegaly. There is tenderness in the right lower quadrant and suprapubic area.  Musculoskeletal: Normal range of motion. She exhibits no edema.  Neurological: She is alert and oriented to person, place, and time.  Skin: Skin is warm and dry. No rash noted.  Psychiatric: Judgment normal.     CMP Latest Ref Rng & Units 05/23/2018  Glucose 70 - 99 mg/dL 96  BUN 6 - 20 mg/dL 15  Creatinine 0.44 - 1.00 mg/dL 0.78  Sodium 135 - 145 mmol/L 133(L)  Potassium 3.5 - 5.1 mmol/L 3.7  Chloride 98 - 111 mmol/L 100  CO2 22 - 32 mmol/L 23  Calcium 8.9 - 10.3 mg/dL 9.6  Total Protein 6.5 - 8.1 g/dL 8.4(H)  Total Bilirubin 0.3 - 1.2 mg/dL 0.6  Alkaline Phos 38 - 126 U/L 173(H)  AST 15 - 41 U/L 52(H)  ALT 0 - 44 U/L 59(H)   CBC Latest Ref Rng & Units 05/23/2018  WBC 3.6 - 11.0 K/uL 4.7  Hemoglobin 12.0 - 16.0 g/dL 12.7  Hematocrit 35.0 - 47.0 % 37.5  Platelets 150 - 440 K/uL 205    No images are attached to the encounter.  Ct Abdomen Pelvis W Contrast  Result Date: 05/23/2018 CLINICAL DATA:  S/P cervical cancer which spread to lung with chemo and radiation tx-pt is start new chemo tx again next week, hysterectomy, cholecystectomy, colon surgery. NKI. Pt c/o abdominal pain x 4 days with N/V/D. EXAM: CT ABDOMEN AND PELVIS WITH CONTRAST TECHNIQUE: Multidetector CT imaging of the abdomen and pelvis was performed using the standard protocol following bolus administration of intravenous contrast. CONTRAST:  145m OMNIPAQUE IOHEXOL 300 MG/ML  SOLN COMPARISON:  CT of the abdomen and pelvis on 05/06/2018 FINDINGS: Lower chest: There are numerous rounded and cavitary masses at the lung bases, stable in appearance. Hepatobiliary: There is focal fatty  infiltration adjacent to the falciform ligament. The gallbladder is surgically absent. Pancreas: No acute abnormality. Spleen: Normal in size without focal abnormality. Adrenals/Urinary Tract: There is symmetric enhancement and excretion from both kidneys. Suspect nonobstructing intrarenal calculi bilaterally, measuring 2-3 millimeters in diameter. There is no hydronephrosis. Ureters are unremarkable. Urinary bladder is unremarkable. Stomach/Bowel: There is symmetric enhancement and excretion from both kidneys. Suspect nonobstructing intrarenal calculi bilaterally, measuring 2-3 millimeters in diameter. There is no hydronephrosis. Ureters are unremarkable. Urinary bladder is unremarkable. Vascular/Lymphatic: The stomach and small bowel loops are normal in appearance. RIGHT hemicolectomy. The anastomosis appears patent. The wall of the colon is mildly thickened. Air-fluid levels are identified within the colon, consistent with diarrhea. No evidence for perforation or abscess. Stable appearance of perirectal stranding. Reproductive: The uterus is present.  No adnexal mass. Other: No free pelvic fluid. Postoperative changes in the anterior abdominal wall, otherwise unremarkable. Musculoskeletal: Degenerative changes in the lumbar spine. No suspicious lytic or blastic lesions are identified. IMPRESSION: 1. Colonic wall thickening, consistent with colitis. Considerations include inflammatory or infectious causes. Ischemic causes would be less likely. RIGHT hemicolectomy. Patent ileocolic anastomosis. 2. Stable rounded and cavitary masses at the lung bases, consistent with known metastatic disease. 3. Nephrolithiasis without acute obstruction. No suspicious renal mass. Electronically Signed   By: ENolon NationsM.D.   On: 05/23/2018 16:17   Ct Biopsy  Result Date: 05/06/2018 INDICATION: History of cervical cancer now with multiple bilateral hypermetabolic pulmonary nodules. Please perform CT-guided biopsy for tissue  diagnostic purposes. EXAM: CT GUIDED LEFT UPPER LOBE PULMONARY NODULE BIOPSY COMPARISON:  PET-CT -  04/23/2018 MEDICATIONS: None. ANESTHESIA/SEDATION: Fentanyl 100 mcg IV; Versed 2 mg IV Sedation time: 25 minutes; The patient was continuously monitored during the procedure by the interventional radiology nurse under my direct supervision. CONTRAST:  None COMPLICATIONS: None immediate. PROCEDURE: Informed consent was obtained from the patient following an explanation of the procedure, risks, benefits and alternatives. The patient understands,agrees and consents for the procedure. All questions were addressed. A time out was performed prior to the initiation of the procedure. The patient was positioned supine on the CT table and a limited chest CT was performed for procedural planning demonstrating unchanged size and appearance of the approximately 1.0 x 0.9 cm solid left upper lobe pulmonary nodule (image 16, series 2). Note, nodule was targeted for biopsy given patient's recent postoperative state of left total knee replacement and therefore her difficulty lying various obliquities on the CT gantry. The operative site was prepped and draped in the usual sterile fashion. Under sterile conditions and local anesthesia, a 17 gauge coaxial needle was advanced into the peripheral aspect of the nodule. Positioning was confirmed with intermittent CT fluoroscopy and followed by the acquisition of 3 core needle biopsies with an 18 gauge core needle biopsy device. The coaxial needle was removed following deployment of a [employer] plug and superficial hemostasis was achieved with manual compression. Limited post procedural chest CT was negative for pneumothorax or additional complication. A dressing was placed. The patient tolerated the procedure well without immediate postprocedural complication. The patient was escorted to have an upright chest radiograph. IMPRESSION: Technically successful CT guided core needle core biopsy of  hypermetabolic left upper lobe pulmonary nodule. Electronically Signed   By: Sandi Mariscal M.D.   On: 05/06/2018 12:01   Dg Chest Port 1 View  Result Date: 05/06/2018 CLINICAL DATA:  History of cervical cancer, now with multiple bilateral hypermetabolic pulmonary nodules worrisome for metastatic disease post CT-guided left upper lobe pulmonary nodule biopsy EXAM: PORTABLE CHEST 1 VIEW COMPARISON:  CT-guided left upper lobe pulmonary nodule biopsy-earlier same day FINDINGS: Normal cardiac silhouette and mediastinal contours. Multiple ill-defined nodules are seen bilaterally. No pleural effusion or pneumothorax. No discrete focal airspace opacities. No evidence of edema. No acute osseus abnormalities. IMPRESSION: No evidence of complication following left upper lobe pulmonary nodule biopsy. Specifically, no evidence of pleural effusion or pneumothorax. Electronically Signed   By: Sandi Mariscal M.D.   On: 05/06/2018 11:58   Dg Knee Left Port  Result Date: 04/24/2018 CLINICAL DATA:  Status post left total knee joint prosthesis placement. EXAM: PORTABLE LEFT KNEE - 1-2 VIEW COMPARISON:  MRI of the left knee of Feb 26, 2018 FINDINGS: The patient has undergone total knee joint prosthesis placement. Radiographic positioning of the prosthetic components is good. The interface with the native bone appears normal. There is no acute native bone fracture. Surgical skin staples are present. IMPRESSION: No immediate postprocedure complication following left total knee joint prosthesis placement. Electronically Signed   By: David  Martinique M.D.   On: 04/24/2018 12:54     Assessment and plan- Patient is a 52 y.o. female who presents with suprapubic abdominal pain and diarrhea x4 days.  1.  Cervical adenocarcinoma: Initial diagnosis in December 2017.  Received cisplatin and radiation from 11/2016 05/2017.  Had follow-up scans 2018 that revealed no disease.  Noted to have some enlarging nodules.  Her course was complicated by  several ER admissions for small bowel obstruction, recurrent abdominal pain and emesis.  Subsequent imaging was performed.  Most recently PET scan revealed  numerous scattered solid and cavitary nodules in the lungs.  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  Scheduled to begin carbo/Taxol next week.  2.  Abdominal/pelvic pain: Patient with long-standing history of pelvic abdominal pain.  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  Patient is afebrile.  Pain is 8 out of 10 intermittent.  Stat CT abdomen and pelvis revealed colonic wall thickening consistent with colitis.  Patient asymptomatic with severe abdominal pain and grade 3 diarrhea with greater than 6/day.  Will start antibiotics: RX Flagyl 500 mg 3 times daily for 7 days and RX ciprofloxacin 500 mg twice daily for 7 days. RX Bentyl 20 mg every 6 for pain.  Reviewed side effects of each medication.  Patient's kidney function is within normal range.  She is scheduled to return to clinic next week for evaluation prior to beginning carbo/Taxol.  To be reassessed at that time.  3. Hypomagnesemia/hyponatremia: Magnesium 1.5 today Sodium 133.  We will give 2 g magnesium with 1 L NaCl.  4. Diarrhea: Will collect GI panel and C-diff to r/o infection.  Has not been on antibiotics recently.  Unfortunately, was unable to collect sample in office due to use of Imodium.  Patient was sent home with collection kit.  Encouraged to return ASAP.  Instructed not to take Imodium until next bowel movement.   5. Nausea: RX Zofran 8 mg sent to pharmacy.    Visit Diagnosis 1. Malignant neoplasm of overlapping sites of cervix ([place])   2. Diarrhea of presumed infectious origin   3. Colitis   4. Periumbilical abdominal pain     Patient expressed understanding  and was in agreement with this plan. She also understands that She can call clinic at any time with any questions, concerns, or complaints.   Greater than 50% was spent in counseling and coordination of care with this patient including but not limited to discussion of the relevant topics above (See A&P) including, but not limited to diagnosis and management of acute and chronic medical conditions.    Faythe Casa, AGNP-C [facility] [place]- [phone] Pager- [phone] 05/24/2018 10:23 AM

## 2018-05-23 NOTE — Telephone Encounter (Signed)
Patient coming in to be seen

## 2018-05-24 MED ORDER — ONDANSETRON HCL 8 MG PO TABS: 8.0000 mg | ORAL_TABLET | Freq: Three times a day (TID) | ORAL | 0 refills | Status: DC | PRN

## 2018-05-25 LAB — URINE CULTURE

## 2018-05-26 ENCOUNTER — Other Ambulatory Visit
Admission: RE | Admit: 2018-05-26 | Discharge: 2018-05-26 | Disposition: A | Discharge status: Home / Self Care | Payer: Medicaid Other | Source: Ambulatory Visit | Attending: Oncology | Admitting: Oncology

## 2018-05-26 DIAGNOSIS — R197 Diarrhea, unspecified: Secondary | ICD-10-CM | POA: Insufficient documentation

## 2018-05-26 LAB — GASTROINTESTINAL PANEL BY PCR, STOOL (REPLACES STOOL CULTURE)
ADENOVIRUS F40/41: NOT DETECTED
Astrovirus: NOT DETECTED
CRYPTOSPORIDIUM: NOT DETECTED
CYCLOSPORA CAYETANENSIS: NOT DETECTED
Campylobacter species: NOT DETECTED
ENTEROPATHOGENIC E COLI (EPEC): NOT DETECTED
Entamoeba histolytica: NOT DETECTED
Enteroaggregative E coli (EAEC): NOT DETECTED
Enterotoxigenic E coli (ETEC): NOT DETECTED
GIARDIA LAMBLIA: NOT DETECTED
Norovirus GI/GII: NOT DETECTED
Plesimonas shigelloides: NOT DETECTED
Rotavirus A: NOT DETECTED
Salmonella species: NOT DETECTED
Sapovirus (I, II, IV, and V): NOT DETECTED
Shiga like toxin producing E coli (STEC): NOT DETECTED
Shigella/Enteroinvasive E coli (EIEC): NOT DETECTED
VIBRIO SPECIES: NOT DETECTED
Vibrio cholerae: NOT DETECTED
YERSINIA ENTEROCOLITICA: NOT DETECTED

## 2018-05-26 LAB — C DIFFICILE QUICK SCREEN W PCR REFLEX
C DIFFICILE (CDIFF) INTERP: NOT DETECTED
C Diff antigen: NEGATIVE
C Diff toxin: NEGATIVE

## 2018-05-28 ENCOUNTER — Telehealth: Payer: Self-pay | Admitting: *Deleted

## 2018-05-28 ENCOUNTER — Encounter: Payer: Self-pay | Admitting: Hematology and Oncology

## 2018-05-28 ENCOUNTER — Inpatient Hospital Stay (HOSPITAL_BASED_OUTPATIENT_CLINIC_OR_DEPARTMENT_OTHER): Payer: Medicaid Other | Admitting: Hematology and Oncology

## 2018-05-28 ENCOUNTER — Encounter: Payer: Self-pay | Admitting: Nurse Practitioner

## 2018-05-28 ENCOUNTER — Inpatient Hospital Stay: Payer: Medicaid Other

## 2018-05-28 VITALS — BP 114/76 | HR 52 | Temp 94.6°F | Resp 18 | Wt 167.4 lb

## 2018-05-28 DIAGNOSIS — R1033 Periumbilical pain: Secondary | ICD-10-CM

## 2018-05-28 DIAGNOSIS — E871 Hypo-osmolality and hyponatremia: Secondary | ICD-10-CM

## 2018-05-28 DIAGNOSIS — K529 Noninfective gastroenteritis and colitis, unspecified: Secondary | ICD-10-CM

## 2018-05-28 DIAGNOSIS — C7802 Secondary malignant neoplasm of left lung: Secondary | ICD-10-CM

## 2018-05-28 DIAGNOSIS — Z5111 Encounter for antineoplastic chemotherapy: Secondary | ICD-10-CM | POA: Diagnosis not present

## 2018-05-28 DIAGNOSIS — C7800 Secondary malignant neoplasm of unspecified lung: Secondary | ICD-10-CM

## 2018-05-28 DIAGNOSIS — Z79899 Other long term (current) drug therapy: Secondary | ICD-10-CM

## 2018-05-28 DIAGNOSIS — Z923 Personal history of irradiation: Secondary | ICD-10-CM

## 2018-05-28 DIAGNOSIS — R110 Nausea: Secondary | ICD-10-CM

## 2018-05-28 DIAGNOSIS — E538 Deficiency of other specified B group vitamins: Secondary | ICD-10-CM

## 2018-05-28 DIAGNOSIS — R197 Diarrhea, unspecified: Secondary | ICD-10-CM

## 2018-05-28 DIAGNOSIS — C539 Malignant neoplasm of cervix uteri, unspecified: Secondary | ICD-10-CM

## 2018-05-28 DIAGNOSIS — C538 Malignant neoplasm of overlapping sites of cervix uteri: Secondary | ICD-10-CM

## 2018-05-28 LAB — CBC WITH DIFFERENTIAL/PLATELET
Basophils Absolute: 0 10*3/uL (ref 0–0.1)
Basophils Relative: 0 %
Eosinophils Absolute: 0.1 10*3/uL (ref 0–0.7)
Eosinophils Relative: 3 %
HCT: 35 % (ref 35.0–47.0)
Hemoglobin: 12.1 g/dL (ref 12.0–16.0)
Lymphocytes Relative: 30 %
Lymphs Abs: 1 10*3/uL (ref 1.0–3.6)
MCH: 34.3 pg — ABNORMAL HIGH (ref 26.0–34.0)
MCHC: 34.8 g/dL (ref 32.0–36.0)
MCV: 98.8 fL (ref 80.0–100.0)
Monocytes Absolute: 0.3 10*3/uL (ref 0.2–0.9)
Monocytes Relative: 7 %
Neutro Abs: 2.1 10*3/uL (ref 1.4–6.5)
Neutrophils Relative %: 60 %
Platelets: 194 10*3/uL (ref 150–440)
RBC: 3.54 MIL/uL — ABNORMAL LOW (ref 3.80–5.20)
RDW: 13.1 % (ref 11.5–14.5)
WBC: 3.5 10*3/uL — ABNORMAL LOW (ref 3.6–11.0)

## 2018-05-28 LAB — COMPREHENSIVE METABOLIC PANEL
ALT: 68 U/L — ABNORMAL HIGH (ref 0–44)
AST: 93 U/L — ABNORMAL HIGH (ref 15–41)
Albumin: 3.6 g/dL (ref 3.5–5.0)
Alkaline Phosphatase: 212 U/L — ABNORMAL HIGH (ref 38–126)
Anion gap: 9 (ref 5–15)
BUN: 12 mg/dL (ref 6–20)
CO2: 23 mmol/L (ref 22–32)
Calcium: 9.1 mg/dL (ref 8.9–10.3)
Chloride: 104 mmol/L (ref 98–111)
Creatinine, Ser: 0.76 mg/dL (ref 0.44–1.00)
GFR calc Af Amer: >60 mL/min (ref >60)
GFR calc non Af Amer: >60 mL/min (ref >60)
Glucose, Bld: 114 mg/dL — ABNORMAL HIGH (ref 70–99)
Potassium: 3.4 mmol/L — ABNORMAL LOW (ref 3.5–5.1)
Sodium: 136 mmol/L (ref 135–145)
Total Bilirubin: 0.4 mg/dL (ref 0.3–1.2)
Total Protein: 7 g/dL (ref 6.5–8.1)

## 2018-05-28 LAB — MAGNESIUM: Magnesium: 1.5 mg/dL — ABNORMAL LOW (ref 1.7–2.4)

## 2018-05-28 MED ORDER — MAGNESIUM OXIDE 400 MG PO TABS: 400.0000 mg | ORAL_TABLET | Freq: Every day | ORAL | 0 refills | Status: DC

## 2018-05-28 MED ORDER — HEPARIN SOD (PORK) LOCK FLUSH 100 UNIT/ML IV SOLN
500.0000 [IU] | Freq: Once | INTRAVENOUS | Status: AC
  Administered 2018-05-28: 500 [IU] via INTRAVENOUS
  Filled 2018-05-28: qty 5

## 2018-05-28 MED ORDER — CIPROFLOXACIN HCL 500 MG PO TABS: 500.0000 mg | ORAL_TABLET | Freq: Two times a day (BID) | ORAL | 0 refills | Status: DC

## 2018-05-28 MED ORDER — SODIUM CHLORIDE 0.9% FLUSH
10.0000 mL | Freq: Once | INTRAVENOUS | Status: AC
  Administered 2018-05-28: 10 mL via INTRAVENOUS
  Filled 2018-05-28: qty 10

## 2018-05-28 MED ORDER — POTASSIUM CHLORIDE CRYS ER 20 MEQ PO TBCR: 20.0000 meq | EXTENDED_RELEASE_TABLET | Freq: Every day | ORAL | 0 refills | Status: DC

## 2018-05-28 MED ORDER — METRONIDAZOLE 500 MG PO TABS: 500.0000 mg | ORAL_TABLET | Freq: Three times a day (TID) | ORAL | 0 refills | Status: DC

## 2018-05-28 NOTE — Telephone Encounter (Signed)
-----   Message from Karen Kitchens, NP sent at 05/28/2018 12:42 PM EDT ----- K+ and Mg low. I will start her on:  1. KDU 20 mEq PO x 3 days.  2. Mag-ox 400 mg daily  Cipro and Flagyl course extensions (3 more days) have also been sent in as dicussed.   Gaspar Bidding

## 2018-05-28 NOTE — Telephone Encounter (Signed)
Called patient and LVM that K+ and Mag are both low.  Prescription has been sent for 3 days - 20 meq K+  (one tablet daily for 3 days), also  Mag Ox 400 mg daily and extended Cipro and Flagyl for 3 more days as discussed at her clinic visit today.

## 2018-05-28 NOTE — Progress Notes (Signed)
[facility] day:  05/28/2018   Chief Complaint: Joanna Hall is a 52 y.o. female with a history of cervical cancer s/p chemoradiation who is seen for assessment prior to cycle #1 carboplatin and Taxol.  HPI:  The patient was last seen in the medical oncology clinic on 05/08/2018.  At that time, she was recovering well from her knee replacement.  She deniedany respiratory symptoms.  Exam was unremarkable.  PET scan on 04/23/2018 revealed numerous scattered solid and cavitary nodules in the lungs stable increased in size.  CT guided biopsy of a left upper lobe pulmonary nodule on 05/06/2018 confirmed metastatic adenocarcinoma.    We discussed treatment options.  Port-a-cath was placed on 05/13/2018 by Dr. Hortencia Pilar.  She was seen by Dr. Fransisca Connors on 05/15/2018.  Notes were reviewed.  Plan was for follow-up CT scan after 3 cycles.  Check PDL1 on lung biopsy to see if eligible for Keytruda.  She has a follow-up in 3-4 months.  Patient attended chemotherapy education class with Magdalene Patricia, RN on 05/16/2018. She was provided with written information regarding her proposed course of chemotherapy treatments. Patient was given the opportunity to have her preliminary clinical questions fielded by cancer center nurse navigator.   She was seen by Faythe Casa, NP on 05/23/2018 with abdominal pain and diarrhea.Notes were reviewed.  The pain was described as aching and stabbing, and is 8/10 in intensity. Pain was located in the LLQ, suprapubic without radiation. Onset was 4 days prior. Symptoms had been gradually worsening since. Aggravating factors: eating and movement.  Alleviating factors: pain meds. Associated symptoms: anorexia, diarrhea, nausea and vomiting. The patient denied constipation, fever and myalgias.  She had grade 3 diarrhea with > 6 stools per day.  Abdomen and pelvic CT on 05/23/2018 revealed colonic wall thickening, consistent with  colitis. Considerations include inflammatory or infectious causes. Ischemic causes would be less likely. RIGHT hemicolectomy. Patent ileocolic anastomosis.  C. diff and GI panel by PCR was negative.  She was prescribed Flagyl and ciprofloxacin x 7 days.  She received an Rx for Bentyl 20 mg po q 6 hours prn pain.  She received 1 liter IVF and 2 gm IV magnesium (Mg 1.5).   During the interim, patient improving with regards to her active colitis infection. She continues on antibiotics, with planned course completion on 05/29/2018. Nausea and vomiting has resolved. She has daily diarrhea, however she notes that it is hard to tell if it diiffers from baseline. Patient on daily Questran for her bowels. She has not experienced any fevers.   Patient feeling generally well today. She denies any acute symptoms. Energy level has improved overall. She has chronic exertional dyspnea. She is improving following LEFT knee surgery. Swelling has reduced and she is "finally able to wear capri pants".   Patient advises that she maintains an adequate appetite. She is eating well. Weight today is 167 lb 6 oz (75.9 kg), which compared to her last visit to the clinic, represents a 4 pound increase.   Patient complain of pain rated 4/10 in the clinic today.  She received her monthly B12 injection (given by sister) on 05/23/2018 or 05/24/2018.   Past Medical History:  Diagnosis Date  . Anxiety   . Aortic atherosclerosis ([place])   . Arthritis    neck and knees  . 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 Blood transfusion without reported diagnosis   .  Cervical cancer (HCC)    mets lung  . Chronic anal fissure   . Chronic diarrhea   . Dyspnea   . Factor V Leiden mutation ([place])   . Fecal incontinence   . Genital warts   . GERD (gastroesophageal reflux disease)   . Heart murmur   . Hemorrhoids   . 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 History of cancer chemotherapy     completed 06/2017  . History of Clostridium difficile infection    while undergoing chemo.  Negative test 10/2017  . 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 Macrocytic anemia with vitamin B12 deficiency   . Perianal condylomata   . Pneumonia    History of  . Pulmonary nodules   . Small bowel obstruction ([place]) 08/2017  . Stiff neck    limited right turn  . Vitamin D deficiency     Past Surgical History:  Procedure Laterality Date  . CHOLECYSTECTOMY    . COLON SURGERY  08/2017   resection  . COLONOSCOPY WITH PROPOFOL N/A 12/20/2017   Procedure: COLONOSCOPY WITH PROPOFOL;  Surgeon: [name], MD;  Location: [facility] ENDOSCOPY;  Service: Gastroenterology;  Laterality: N/A;  . DIAGNOSTIC LAPAROSCOPY    . ESOPHAGOGASTRODUODENOSCOPY (EGD) WITH PROPOFOL N/A 12/20/2017   Procedure: ESOPHAGOGASTRODUODENOSCOPY (EGD) WITH PROPOFOL;  Surgeon: [name], MD;  Location: [facility];  Service: Gastroenterology;  Laterality: N/A;  . LAPAROTOMY N/A 08/31/2017   Procedure: EXPLORATORY LAPAROTOMY for SBO, ileocolectomy, removal of piece of uterine wall;  Surgeon: [name], MD;  Location: [facility] ORS;  Service: General;  Laterality: N/A;  . LASER ABLATION CONDOLAMATA N/A 02/22/2018   Procedure: LASER ABLATION/REMOVAL OF UVOZDGUYQIH AROUND ANUS AND VAGINA;  Surgeon: Michael Boston, MD;  Location: [place];  Service: General;  Laterality: N/A;  . OOPHORECTOMY    . PORTA CATH INSERTION N/A 05/13/2018   Procedure: PORTA CATH INSERTION;  Surgeon: Katha Cabal, MD;  Location: [facility];  Service: Cardiovascular;  Laterality: N/A;  . SMALL INTESTINE SURGERY    . TANDEM RING INSERTION     x3  . THORACOTOMY    . TOTAL KNEE ARTHROPLASTY Left 04/24/2018   Procedure: TOTAL KNEE ARTHROPLASTY;  Surgeon: Lovell Sheehan, MD;  Location: [facility] ORS;  Service: Orthopedics;  Laterality: Left;    Family History  Problem Relation Age of Onset  .  Hypertension Father   . Diabetes Father   . Alcohol abuse Daughter   . Hypertension Maternal Grandmother   . Diabetes Maternal Grandmother   . Diabetes Paternal Grandmother   . Hypertension Paternal Grandmother     Social History:  reports that she quit smoking about 11 years ago. She has never used smokeless tobacco. She reports that she drinks alcohol. She reports that she has current or past drug history. Drug: Marijuana.  The patient's 50 year old daughter died of alcoholism.  The patient is originally from [place].  She moved from [place] to [place]. She is alone today.  Allergies:  Allergies  Allergen Reactions  . Ketamine Anxiety and Other (See Comments)    Syncope episode/confusion     Current Medications: Current Outpatient Medications  Medication Sig Dispense Refill  . cholestyramine (QUESTRAN) 4 g packet Take 1 packet (4 g total) by mouth 3 (three) times daily. (Patient taking differently: Take 4 g by mouth 2 (two) times daily. ) 270 packet  0  . ciprofloxacin (CIPRO) 500 MG tablet Take 1 tablet (500 mg total) by mouth 2 (two) times daily. 14 tablet 0  . cyanocobalamin (,VITAMIN B-12,) 1000 MCG/ML injection Inject 1ML every week x 4 weeks, then 1ML every other week x 2 months, then monthly x 3 months (Patient taking differently: Inject 1,000 mcg into the muscle every 30 (thirty) days. Inject 1ML every week x 4 weeks, then 1ML every other week x 2 months, then monthly x 3 months) 10 mL 1  . dicyclomine (BENTYL) 10 MG capsule Take 1 capsule (10 mg total) by mouth 4 (four) times daily -  before meals and at bedtime. 360 capsule 1  . dicyclomine (BENTYL) 20 MG tablet Take 1 tablet (20 mg total) by mouth every 6 (six) hours. 20 tablet 0  . fluticasone (FLONASE) 50 MCG/ACT nasal spray Place 2 sprays into both nostrils daily. 16 g 0  . HYDROmorphone (DILAUDID) 4 MG tablet Take 4 mg by mouth every 6 (six) hours as needed for severe pain.     Marland Kitchen lidocaine-prilocaine  (EMLA) cream Apply 1 application topically as needed. Apply small amount to port site at least 1 hour prior to it being accessed, cover with plastic wrap 30 g 3  . loratadine (CLARITIN) 10 MG tablet Take 10 mg by mouth daily.    . metroNIDAZOLE (FLAGYL) 500 MG tablet Take 1 tablet (500 mg total) by mouth 3 (three) times daily. 21 tablet 0  . Multiple Vitamin (MULTIVITAMIN WITH MINERALS) TABS tablet Take 1 tablet daily by mouth.    . ondansetron (ZOFRAN) 8 MG tablet Take 1 tablet (8 mg total) by mouth every 8 (eight) hours as needed for nausea or vomiting. 20 tablet 0  . rivaroxaban (XARELTO) 20 MG TABS tablet Take 1 tablet (20 mg total) by mouth daily with supper. 30 tablet 1  . sertraline (ZOLOFT) 50 MG tablet Take 1 tablet (50 mg total) by mouth daily. 30 tablet 3  . Syringe/Needle, Disp, (SYRINGE 3CC/22GX1") 22G X 1" 3 ML MISC For use with Vitamin B12 injections 50 each 0  . VENTOLIN HFA 108 (90 Base) MCG/ACT inhaler INHALE 1-2 PUFFS BY MOUTH EVERY 4 HOURS AS NEEDED FOR WHEEZING / SHORTNESS OF BREATH  0  . oxyCODONE 10 MG TABS Take 1 tablet (10 mg total) by mouth every 4 (four) hours as needed for severe pain (pain score 7-10). (Patient not taking: Reported on 05/28/2018) 30 tablet 0   No current facility-administered medications for this visit.    Facility-Administered Medications Ordered in Other Visits  Medication Dose Route Frequency Provider Last Rate Last Dose  . heparin lock flush 100 unit/mL  500 Units Intravenous Once Corcoran, Melissa C, MD      . sodium chloride flush (NS) 0.9 % injection 10 mL  10 mL Intravenous PRN Jacquelin Hawking, NP   10 mL at 05/23/18 1225    Review of Systems  Constitutional: Negative for diaphoresis, fever, malaise/fatigue and weight loss (weight up 4 pounds).       Energy level is better.  HENT: Negative.   Eyes: Negative.   Respiratory: Positive for shortness of breath (exertional). Negative for cough, hemoptysis and sputum production.    Cardiovascular: Negative for chest pain, palpitations, orthopnea, leg swelling and PND.  Gastrointestinal: Positive for abdominal pain (chronic tenderness) and diarrhea (on daily Questran. (+) current colitis - on ABX). Negative for blood in stool, constipation, melena, nausea and vomiting.  Genitourinary: Negative for dysuria, frequency, hematuria and urgency.  Musculoskeletal: Positive for joint pain (OA in shoulders and neck. Recnet LEFT TKR - healing well). Negative for back pain, falls and myalgias.  Skin: Negative for itching and rash.  Neurological: Negative for dizziness, tremors, weakness and headaches.  Endo/Heme/Allergies: Does not bruise/bleed easily.  Psychiatric/Behavioral: Negative for depression, memory loss and suicidal ideas. The patient is not nervous/anxious and does not have insomnia.   All other systems reviewed and are negative.  Performance status (ECOG): 1 - 2  Vital Signs BP 114/76 (BP Location: Left Arm, Patient Position: Sitting)   Pulse (!) 52   Temp (!) 94.6 F (34.8 C) (Tympanic)   Resp 18   Wt 167 lb 6 oz (75.9 kg)   BMI 26.21 kg/m   Physical Exam  Constitutional: She is oriented to person, place, and time and well-developed, well-nourished, and in no distress.  She has a cane at her side.  HENT:  Head: Normocephalic and atraumatic.  Brown hair.  Eyes: Pupils are equal, round, and reactive to light. EOM are normal. No scleral icterus.  Blue eyes.   Neck: Normal range of motion. Neck supple. No tracheal deviation present. No thyromegaly present.  Cardiovascular: Normal rate, regular rhythm and normal heart sounds. Exam reveals no gallop and no friction rub.  No murmur heard. Pulmonary/Chest: Effort normal and breath sounds normal. No respiratory distress. She has no wheezes. She has no rales.  Abdominal: Soft. Bowel sounds are normal. She exhibits no distension. There is tenderness (minimal).  Musculoskeletal: Normal range of motion. She exhibits no  edema.       Left knee: No tenderness found.       Legs: Lymphadenopathy:    She has no cervical adenopathy.    She has no axillary adenopathy.       Right: No inguinal and no supraclavicular adenopathy present.       Left: No inguinal and no supraclavicular adenopathy present.  Neurological: She is alert and oriented to person, place, and time.  Skin: Skin is warm and dry. No rash noted. No erythema.  Psychiatric: Mood, affect and judgment normal.  Nursing note and vitals reviewed.   Imaging studies: 06/2017:  PET scan revealed enlarging pulmonary nodules but no evidence of abdominal disease per patient report. 09/13/2017:  Abdomen and pelvic CT revealed a small infarct in the inferior pole of the LEFT kidney and RIGHT uterine infarct. 11/08/2017:  PET scan at the [place] [facility] revealed innumerable stable bilateral cavitary pulmonary nodules (up to 8 mm), some of which demonstrate low level metabolic activity.  There were post treatment changes in the pelvis without evidence of suspicious hypermetabolic activity. 12/03/2017:  Abdomen and pelvic CT revealed continued multiple pulmonary nodules in the lung bases concerning for metastatic disease.  There was mild wall thickening of rectosigmoid colon is noted.  There was a stable 1.8 x 1.6 cm mixed fat and soft tissue density noted in right lower quadrant which may represent fat necrosis.  There was a larger low density is noted involving uterine fundus. 12/03/2017:  Pelvic MRI was unremarkable.   04/15/2018:  Chest, abdomen, and pelvis CT revealed innumerable (> 100) cavitary nodules scattered in the lungs, moderately enlarging compared to the 11/08/2017 PET-CT, suspicious for metastatic disease.  One index node in the RLL measures 1.0 x 1.1 cm (previously 0.6 x 0.6 cm).  There were no new nodules.  There was an ill-defined wall thickening in the rectosigmoid with surrounding stranding along fascia planes, probably sequela  from prior  radiation therapy.  There was multilevel lumbar impingement due to spondylosis and degenerative disc disease.  There was heterogeneous enhancement in the uterus (some possibly from prior radiation therapy). 04/23/2018:  PET scan revealed numerous scattered solid and cavitary nodules in the lungs stable increased in size compared to the prior PET-CT from 11/08/2017. Largest nodule was 1.1 cm in the LUL (SUV 1.9).  These demonstrated low-grade metabolic activity up to a maximum SUV of about 2.3, increased from 11/08/2017.    Infusion on 05/28/2018  Component Date Value Ref Range Status  . Magnesium 05/28/2018 1.5* 1.7 - 2.4 mg/dL Final   Performed at [facility], [address], [place], [place] [address]  . Sodium 05/28/2018 136  135 - 145 mmol/L Final  . Potassium 05/28/2018 3.4* 3.5 - 5.1 mmol/L Final  . Chloride 05/28/2018 104  98 - 111 mmol/L Final  . CO2 05/28/2018 23  22 - 32 mmol/L Final  . Glucose, Bld 05/28/2018 114* 70 - 99 mg/dL Final  . BUN 05/28/2018 12  6 - 20 mg/dL Final  . Creatinine, Ser 05/28/2018 0.76  0.44 - 1.00 mg/dL Final  . Calcium 05/28/2018 9.1  8.9 - 10.3 mg/dL Final  . Total Protein 05/28/2018 7.0  6.5 - 8.1 g/dL Final  . Albumin 05/28/2018 3.6  3.5 - 5.0 g/dL Final  . AST 05/28/2018 93* 15 - 41 U/L Final  . ALT 05/28/2018 68* 0 - 44 U/L Final  . Alkaline Phosphatase 05/28/2018 212* 38 - 126 U/L Final  . Total Bilirubin 05/28/2018 0.4  0.3 - 1.2 mg/dL Final  . GFR calc non Af Amer 05/28/2018 >60  >60 mL/min Final  . GFR calc Af Amer 05/28/2018 >60  >60 mL/min Final   Comment: (NOTE) The eGFR has been calculated using the CKD EPI equation. This calculation has not been validated in all clinical situations. eGFR's persistently <60 mL/min signify possible Chronic Kidney Disease.   Georgiann Hahn gap 05/28/2018 9  5 - 15 Final   Performed at [facility], [address] [place], [place] [address]  . WBC 05/28/2018 3.5* 3.6 - 11.0 K/uL Final   . RBC 05/28/2018 3.54* 3.80 - 5.20 MIL/uL Final  . Hemoglobin 05/28/2018 12.1  12.0 - 16.0 g/dL Final  . HCT 05/28/2018 35.0  35.0 - 47.0 % Final  . MCV 05/28/2018 98.8  80.0 - 100.0 fL Final  . MCH 05/28/2018 34.3* 26.0 - 34.0 pg Final  . MCHC 05/28/2018 34.8  32.0 - 36.0 g/dL Final  . RDW 05/28/2018 13.1  11.5 - 14.5 % Final  . Platelets 05/28/2018 194  150 - 440 K/uL Final  . Neutrophils Relative % 05/28/2018 60  % Final  . Neutro Abs 05/28/2018 2.1  1.4 - 6.5 K/uL Final  . Lymphocytes Relative 05/28/2018 30  % Final  . Lymphs Abs 05/28/2018 1.0  1.0 - 3.6 K/uL Final  . Monocytes Relative 05/28/2018 7  % Final  . Monocytes Absolute 05/28/2018 0.3  0.2 - 0.9 K/uL Final  . Eosinophils Relative 05/28/2018 3  % Final  . Eosinophils Absolute 05/28/2018 0.1  0 - 0.7 K/uL Final  . Basophils Relative 05/28/2018 0  % Final  . Basophils Absolute 05/28/2018 0.0  0 - 0.1 K/uL Final   Performed at [facility], [address], [place], [place] [address]  Hospital Outpatient Visit on 05/26/2018  Component Date Value Ref Range Status  . Campylobacter species 05/26/2018 NOT DETECTED  NOT DETECTED Final  . Plesimonas  shigelloides 05/26/2018 NOT DETECTED  NOT DETECTED Final  . Salmonella species 05/26/2018 NOT DETECTED  NOT DETECTED Final  . Yersinia enterocolitica 05/26/2018 NOT DETECTED  NOT DETECTED Final  . Vibrio species 05/26/2018 NOT DETECTED  NOT DETECTED Final  . Vibrio cholerae 05/26/2018 NOT DETECTED  NOT DETECTED Final  . Enteroaggregative E coli (EAEC) 05/26/2018 NOT DETECTED  NOT DETECTED Final  . Enteropathogenic E coli (EPEC) 05/26/2018 NOT DETECTED  NOT DETECTED Final  . Enterotoxigenic E coli (ETEC) 05/26/2018 NOT DETECTED  NOT DETECTED Final  . Shiga like toxin producing E coli * 05/26/2018 NOT DETECTED  NOT DETECTED Final  . Shigella/Enteroinvasive E coli (EI* 05/26/2018 NOT DETECTED  NOT DETECTED Final  . Cryptosporidium 05/26/2018 NOT DETECTED  NOT DETECTED Final  .  Cyclospora cayetanensis 05/26/2018 NOT DETECTED  NOT DETECTED Final  . Entamoeba histolytica 05/26/2018 NOT DETECTED  NOT DETECTED Final  . Giardia lamblia 05/26/2018 NOT DETECTED  NOT DETECTED Final  . Adenovirus F40/41 05/26/2018 NOT DETECTED  NOT DETECTED Final  . Astrovirus 05/26/2018 NOT DETECTED  NOT DETECTED Final  . Norovirus GI/GII 05/26/2018 NOT DETECTED  NOT DETECTED Final  . Rotavirus A 05/26/2018 NOT DETECTED  NOT DETECTED Final  . Sapovirus (I, II, IV, and V) 05/26/2018 NOT DETECTED  NOT DETECTED Final   Performed at [facility], [address] [place], [place] [address]  . C Diff antigen 05/26/2018 NEGATIVE  NEGATIVE Final  . C Diff toxin 05/26/2018 NEGATIVE  NEGATIVE Final  . C Diff interpretation 05/26/2018 No C. difficile detected.   Final   Performed at [facility], [address] [place], [place] [address]    Assessment:  Joanna Hall is a 51 y.o. female with a history of stage IB1 cervical cancer s/p concurrent cisplatin and radiation (external beam and brachytherapy) from 11/2016 - 02/2017.    She was treated by Dr. Christene Slates at [facility] in [place], [place].  In 01/2017, she received cisplatin x 2 and carboplatin x 1 (01/29/2017) due to ARF and XRT. Radiation was followed by T & O on 02/01/2017 and T & N on 02/10/2017 and 02/20/2017.  Course was complicated by weight loss (80 pounds), nausea, vomiting, electrolyte wasting (potassium and magnesium), and multiple hospitalizations.  PET scan in 06/2017 revealed enlarging pulmonary nodules but no evidence of abdominal disease per patient report.  She is s/p right ileocolectomy on 08/31/2017 for a small bowel obstruction.  She presented with abdominal pain.  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  Chest, abdomen, and pelvis CT on 04/15/2018 revealed innumerable (> 100)  cavitary nodules scattered in the lungs, moderately enlarging compared to the 11/08/2017 PET-CT, suspicious for metastatic disease. One index node in the RLL measures 1.0 x 1.1 cm.  There were no new nodules.  There was an ill-defined wall thickening in the rectosigmoid with surrounding stranding along fascia planes, probably sequela from prior radiation therapy.  There was multilevel lumbar impingement due to spondylosis and degenerative disc disease.  There was heterogeneous enhancement in the uterus (some possibly from prior radiation therapy).  PET scan on 04/23/2018 revealed numerous scattered solid and cavitary nodules in the lungs stable increased in size compared to the prior PET-CT from 11/08/2017. Largest nodule was 1.1 cm in the LUL (SUV 1.9).  These demonstrated low-grade metabolic activity up to a maximum SUV of about 2.3, increased from 11/08/2017.  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  Testing was negative for prothrombin gene mutation, lupus anticoagulant panel, anticardiolipin antibodies, and beta-2 glycoprotein antibodies.  She was discharged on Xarelto.  She was admitted to [facility] from 12/03/2017 - 12/08/2017 with abdominal pain. She was not felt to have a uterine infarct.  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  The etiology was unclear.  She is hepatitis C positive.   Hepatitis C genotype is 2a/2c.  She has B12 deficiency.  B12 was 165 on 11/27/2017.  She began B12 injections on 11/29/2017 (last 05/23/2018 at home).  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  She was diagnosed with colitis.  Abdomen and pelvic CT on 05/23/2018 revealed colonic wall thickening, consistent with colitis. Considerations include inflammatory or infectious  causes. Ischemic causes would be less likely. She is s/p RIGHT hemicolectomy. She had a patent ileocolic anastomosis.  She began ciprofloxacin and Flagyl.  Symptomatically, patient is doing well. She continues to recover from her recent LEFT knee surgery. Patient on 7 day course of ciprofloxacin and metronidazole for active colitis. Diarrhea persists, however it does not differ from patient's baseline bowel habits. She is on Questran daily. Exam is grossly unremarkable.  WBC 3500 (Novelty 2100). Platelets 194,000. Potassium low at 3.4. LFTs elevated; AST 93, ALT 68, and alkaline phosphatase 212.  Plan:   1. Labs today:  CBC with diff, CMP, Mg. 2. Cervical cancer - treatment ongoing  Review plans for treatment. Patient to receive paclitaxel + carboplatin every 3 weeks x 6 cycles. Side effects reviewed.  Patient has had increased risk for myelosuppression, alopecia, allergic reactions, electrolyte wasting (potassium and magnesium), neuropathy, and nail changes.  Chemotherapy regimen carries an anticipated > 50% response rate.  [place] testing:  [facility] revealed no reportable genetic alterations.   PDL1 IHC analysis demonstrated a combine positive score (CPS) of 15.  She would be a candidate for pembrolizumab.  Labs reviewed. Blood counts stable and adequate enough for treatment.  However, patient is still being actively treated with antibiotic therapy for colitis.  Additionally, her LFTs are significantly elevated today.  Will postpone cycle #1 carboplatin + paclitaxel. Discuss symptom management.  Patient has antiemetics and pain medications at home to use on a PRN basis. Patient  advising that the  prescribed interventions are adequate at this point. Continue all medications as previously prescribed.  3. Elevated LFTs - acute exacerbation  AST 93, ALT 68, ALP 212, and total bilirubin 0.4.  Patient has had previously elevated LFTs in the past, however values today  represent an acute exacerbation.    Etiology is multifactorial. Patient has underlying cervical cancer, active colitis, is on antibiotics, consumes alcohol, and is (+) of HCV. Will monitor at this time.   4. Colitis - acute  Patient continues on ciprofloxacin and metronidazole for active colitis infection.  She is using Bentyl on a as needed basis for abdominal cramping.    Antibiotic course was initially only prescribed for 7 days. Patient with diffuse abdominal tenderness on exam.  Will extend both the ciprofloxacin and metronidazole courses by an additional 3 days (total 10-day course). 5. Electrolyte derangements - acute  Discuss low potassium. Potassium level 3.4 today. Prescription will be sent in for 20 mEq oral potassium supplement (K-Dur) to be taken daily x 3 days.  Patient reminded that when she starts chemotherapy, she is at increased risk for potassium wasting.  She was encouraged to increase dietary potassium intake.Marland Kitchen  Discuss low magnesium. Magnesium 1.5 today.  Likely related to wasting associated with diarrhea due to patient's current active colitis.  Will start patient on Mag-Ox 400 mg daily.  Will recheck labs at next RTC to assess for improvement. 6. B12 deficiency - stable  Patient can use on monthly parenteral B12 supplementation.  Patient administers vitamin supplementation at home.  Continue as previously prescribed. 7. RTC in 1 week for MD assessment, labs (CBC with diff, CMP, Mg), and cycle #1 carboplatin + paclitaxel (new).   Honor Loh, NP  05/28/2018, 8:57 AM   I saw and evaluated the patient, participating in the key portions of the service and reviewing pertinent diagnostic studies and records.  I reviewed the nurse practitioner's note and agree with the findings and the plan.  The assessment and plan were discussed with the patient.  Multiple questions were asked by the patient and answered.   Nolon Stalls, MD 05/28/2018,8:57 AM

## 2018-05-28 NOTE — Progress Notes (Signed)
Patient here today for first chemo treatment.  Left knee pain, otherwise no complaints.

## 2018-05-29 ENCOUNTER — Encounter: Payer: Self-pay | Admitting: Hematology and Oncology

## 2018-05-29 ENCOUNTER — Telehealth: Payer: Self-pay | Admitting: *Deleted

## 2018-05-29 DIAGNOSIS — K529 Noninfective gastroenteritis and colitis, unspecified: Secondary | ICD-10-CM

## 2018-05-29 NOTE — Telephone Encounter (Signed)
Patient called and reports that she was seen yesterday and reports that she has been on the toilet all day and is in so much pain. I explained to her that at this time of day if she feels she needs to be seen she would need to go to urgent care or ER or we could see he in the morning. She opted for appointment in AM.

## 2018-05-29 NOTE — Telephone Encounter (Signed)
Lab orders entered

## 2018-05-30 ENCOUNTER — Inpatient Hospital Stay: Payer: Medicaid Other

## 2018-05-30 ENCOUNTER — Encounter: Payer: Self-pay | Admitting: Nurse Practitioner

## 2018-05-30 ENCOUNTER — Inpatient Hospital Stay (HOSPITAL_BASED_OUTPATIENT_CLINIC_OR_DEPARTMENT_OTHER): Payer: Medicaid Other | Admitting: Nurse Practitioner

## 2018-05-30 VITALS — BP 97/65 | HR 93 | Temp 97.2°F | Resp 18

## 2018-05-30 DIAGNOSIS — Z5111 Encounter for antineoplastic chemotherapy: Secondary | ICD-10-CM | POA: Diagnosis not present

## 2018-05-30 DIAGNOSIS — K529 Noninfective gastroenteritis and colitis, unspecified: Secondary | ICD-10-CM

## 2018-05-30 DIAGNOSIS — R110 Nausea: Secondary | ICD-10-CM

## 2018-05-30 DIAGNOSIS — C538 Malignant neoplasm of overlapping sites of cervix uteri: Secondary | ICD-10-CM

## 2018-05-30 DIAGNOSIS — C7802 Secondary malignant neoplasm of left lung: Secondary | ICD-10-CM

## 2018-05-30 DIAGNOSIS — Z95828 Presence of other vascular implants and grafts: Secondary | ICD-10-CM

## 2018-05-30 DIAGNOSIS — E871 Hypo-osmolality and hyponatremia: Secondary | ICD-10-CM | POA: Diagnosis not present

## 2018-05-30 DIAGNOSIS — R197 Diarrhea, unspecified: Secondary | ICD-10-CM

## 2018-05-30 DIAGNOSIS — R1033 Periumbilical pain: Secondary | ICD-10-CM

## 2018-05-30 DIAGNOSIS — E860 Dehydration: Secondary | ICD-10-CM

## 2018-05-30 DIAGNOSIS — Z923 Personal history of irradiation: Secondary | ICD-10-CM

## 2018-05-30 DIAGNOSIS — K9089 Other intestinal malabsorption: Secondary | ICD-10-CM | POA: Insufficient documentation

## 2018-05-30 DIAGNOSIS — I951 Orthostatic hypotension: Secondary | ICD-10-CM

## 2018-05-30 DIAGNOSIS — Z79899 Other long term (current) drug therapy: Secondary | ICD-10-CM

## 2018-05-30 LAB — COMPREHENSIVE METABOLIC PANEL
ALT: 41 U/L (ref 0–44)
ANION GAP: 6 (ref 5–15)
AST: 33 U/L (ref 15–41)
Albumin: 3.5 g/dL (ref 3.5–5.0)
Alkaline Phosphatase: 148 U/L — ABNORMAL HIGH (ref 38–126)
BUN: 10 mg/dL (ref 6–20)
CHLORIDE: 106 mmol/L (ref 98–111)
CO2: 23 mmol/L (ref 22–32)
Calcium: 9.1 mg/dL (ref 8.9–10.3)
Creatinine, Ser: 0.73 mg/dL (ref 0.44–1.00)
Glucose, Bld: 96 mg/dL (ref 70–99)
POTASSIUM: 4.3 mmol/L (ref 3.5–5.1)
Sodium: 135 mmol/L (ref 135–145)
TOTAL PROTEIN: 7 g/dL (ref 6.5–8.1)
Total Bilirubin: 0.2 mg/dL — ABNORMAL LOW (ref 0.3–1.2)

## 2018-05-30 LAB — CBC WITH DIFFERENTIAL/PLATELET
Basophils Absolute: 0 10*3/uL (ref 0–0.1)
Basophils Relative: 0 %
Eosinophils Absolute: 0.1 10*3/uL (ref 0–0.7)
Eosinophils Relative: 3 %
HCT: 34.1 % — ABNORMAL LOW (ref 35.0–47.0)
Hemoglobin: 11.7 g/dL — ABNORMAL LOW (ref 12.0–16.0)
Lymphocytes Relative: 26 %
Lymphs Abs: 0.9 10*3/uL — ABNORMAL LOW (ref 1.0–3.6)
MCH: 34.2 pg — ABNORMAL HIGH (ref 26.0–34.0)
MCHC: 34.4 g/dL (ref 32.0–36.0)
MCV: 99.6 fL (ref 80.0–100.0)
Monocytes Absolute: 0.3 10*3/uL (ref 0.2–0.9)
Monocytes Relative: 8 %
Neutro Abs: 2.3 10*3/uL (ref 1.4–6.5)
Neutrophils Relative %: 63 %
Platelets: 178 10*3/uL (ref 150–440)
RBC: 3.43 MIL/uL — ABNORMAL LOW (ref 3.80–5.20)
RDW: 13.4 % (ref 11.5–14.5)
WBC: 3.6 10*3/uL (ref 3.6–11.0)

## 2018-05-30 LAB — MAGNESIUM: MAGNESIUM: 1.4 mg/dL — AB (ref 1.7–2.4)

## 2018-05-30 MED ORDER — SODIUM CHLORIDE 0.9% FLUSH
10.0000 mL | INTRAVENOUS | Status: DC | PRN
  Administered 2018-05-30: 10 mL via INTRAVENOUS
  Filled 2018-05-30: qty 10

## 2018-05-30 MED ORDER — HEPARIN SOD (PORK) LOCK FLUSH 100 UNIT/ML IV SOLN
500.0000 [IU] | Freq: Once | INTRAVENOUS | Status: AC
  Administered 2018-05-30: 500 [IU] via INTRAVENOUS
  Filled 2018-05-30: qty 5

## 2018-05-30 MED ORDER — DIPHENOXYLATE-ATROPINE 2.5-0.025 MG PO TABS: 1.0000 | ORAL_TABLET | Freq: Four times a day (QID) | ORAL | 0 refills | Status: AC | PRN

## 2018-05-30 MED ORDER — MAGNESIUM SULFATE 50 % IJ SOLN: 4.0000 g | Freq: Once | INTRAMUSCULAR | Status: DC

## 2018-05-30 MED ORDER — LOPERAMIDE HCL 2 MG PO CAPS
4.0000 mg | ORAL_CAPSULE | Freq: Once | ORAL | Status: AC
  Administered 2018-05-30: 4 mg via ORAL
  Filled 2018-05-30: qty 2

## 2018-05-30 MED ORDER — MAGNESIUM SULFATE 4 GM/100ML IV SOLN
4.0000 g | Freq: Once | INTRAVENOUS | Status: AC
  Administered 2018-05-30: 4 g via INTRAVENOUS

## 2018-05-30 MED ORDER — SODIUM CHLORIDE 0.9 % IV SOLN
Freq: Once | INTRAVENOUS | Status: AC
  Administered 2018-05-30: 09:00:00 via INTRAVENOUS
  Filled 2018-05-30: qty 1000

## 2018-05-30 MED ORDER — MAGNESIUM SULFATE 4 GM/100ML IV SOLN
INTRAVENOUS | Status: AC
  Filled 2018-05-30: qty 100

## 2018-05-30 NOTE — Progress Notes (Signed)
Patient in symptom management clinic to be evaluated for continued diarrhea not resolved with imodium and questran; also complains of dizziness.

## 2018-05-30 NOTE — Patient Instructions (Signed)
Joanna Hall,   As we discussed, I would like you to increase your Questran to 1 packet 3 times a day and I have called in a prescription for Lomotil to your pharmacy.  Lomotil will help control your diarrhea and you should plan on taking this medicine every 6 hours as needed for diarrhea.  Please stop your oral magnesium for now as this may be contributing to your diarrhea.  I would like for you to return to clinic tomorrow to recheck your labs and possibly give you more IV fluids and/or magnesium and again on Monday for same.  Please make appointment with Dr. Marius Ditch, GI, ASAP.  If you have persistent diarrhea, please let me know so that I can see you sooner.  We hope to have your symptoms under control so that you may be able to have chemotherapy on 06/04/2018 with Dr. Mike Gip.    It was a pleasure to see you today and I hope that you are feeling better.  Please let me know if your symptoms do not improve over the next few days as we can always see you back in our Symptom Management Clinic.  Thank you for allowing me to participate in your care. Beckey Rutter, NP

## 2018-05-30 NOTE — Progress Notes (Signed)
[facility] [place]  Telephone:(336) [phone] [phone]) [phone]  Patient Care Team: Arnetha Courser, MD as PCP - General (Family Medicine) Mellody Drown, MD as Consulting Physician (Obstetrics and Gynecology) Lenor Coffin, MD as Attending Physician (Gynecology) Lequita Asal, MD as Medical Oncologist (Hematology and Oncology) [name], MD as Consulting Physician (Gastroenterology) Michael Boston, MD as Consulting Physician (General Surgery) Lovell Sheehan, MD as Consulting Physician (Orthopedic Surgery) Clent Jacks, RN as Registered Nurse   Name of the patient: Joanna Hall  [id number]  [birth year]   Date of visit: 05/30/18  Diagnosis- Cervical Cancer  Chief complaint/ Reason for visit- Diarrhea   Heme/Onc history:  Oncology History   Patient was diagnosed with cervical adenocarcinoma in [place] in 09/2016.  She has had a long-standing history of abnormal Pap smears.  She presented with an ovarian cyst.  Laparoscopic surgery was pursued but was difficult due to scar tissue.  Had BSO and rupture of cyst with purulent drainage into the abdomen.  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  Radical hysterectomy was aborted.  She was treated by Dr. Christene Slates at [facility] in [place], [place].  Decision was made to pursue concurrent chemotherapy (weekly cisplatin) and radiation.  She received treatment from 11/2016-05/2017.  01/2017 cisplatin x 2 and carboplatin x 1 (01/29/2017) due to ARF and XRT.  XRT was followed by T & O on 02/01/2017 and T & N 02/10/2017 and 02/20/2017.  Course was complicated by 80 pound weight loss, nausea, vomiting, electrolyte wasting (potassium and magnesium).  She describes that.  Is been sick constantly requiring at least 20 hospitalizations.  Follow-up CT chest and PET on 06/2017. Per patient, 'radiation worked' and no disease in the abdomen.  At  that time she was noted to have lung nodules that were growing and follow-up imaging was scheduled for 10/2017.  She was admitted to hospital in [place] for small bowel obstruction which was managed conservatively and she was home for a week prior to traveling to [place] for Thanksgiving holiday where she has family.  She presented to ER in [place] on 08/2017 with nausea, vomiting, and lower abdominal pain.  Symptoms did not respond to conservative treatment.  CT on 08/26/2017 revealed small bowel obstruction with transition in the pelvis just superior to the uterus rather was a long segment of distal ileum with fatty wall thickening compatible with chronic inflammation and/or radiation enteritis. Imaging showed numerous pulmonary nodules consistent with metastatic disease. She underwent laparotomy and right ileocolectomy on 08/31/2017 at [facility].  Surgical findings revealed a thickened, matted, and scarred piece of distal small bowel close to the ileocecal valve.  She was discharged on 09/05/2017.  Pain markedly increased in intensity and imaging was performed on 09/11/2017 which revealed: Debris within the anterior abdominal wall incision concerning for infection versus packing material, s/p post ileo-colectomy with expected postoperative changes, mild colonic ileus, numerous pulmonary nodules highly concerning for metastatic disease, punctate nonobstructing nephrolithiasis.  Staples were removed and one was packed.  She was started on doxycycline.  Abdominal and pelvic CT without contrast on 09/11/2017 revealed debris within anterior abdominal wall incision concerning for infection, versus packing material.She is s/pileocolectomy with expected postoperative changes and mild colonic ileus. There were numerous pulmonary nodules highly concerning for metastatic diseaseand punctate nonobstructing nephrolithiasis. She was readmitted on 09/12/2017. She describes the onset of lower  abdominal pain on 09/09/2017. Pain markedly increased in intensity  on 09/11/2017.  Staples were removed and the wound packed. She was started on doxycycline.  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  PET scan was ordered to evaluate enlarging lung nodules with concern for recurrent cervical cancer but scan was delayed due to insurance and need to be performed in [place].  Presented to ER on 12/03/2017 for abdominal pain and emesis.  Imaging concerning for worsening possible uterine infarct and she was admitted to hospital.  Pelvic MRI was unremarkable.  Remote scarring type changes of uterus thought to be possibly related to radiation.  She was discharged on 12/08/2017.  Underwent endoscopy and colonoscopy on 12/20/2017.    On 02/22/2018 she underwent laser ablation of condylomata around the anus and vagina under anesthesia with Dr. Johney Maine.   04/15/2018: Chest, abdomen, and pelvis CTrevealed innumerable (>100) cavitary nodules scattered in the lungs, moderately enlarging compared to the 11/08/2017 PET-CT, suspicious for metastatic disease. One index node in the RLL measures 1.0 x 1.1 cm (previously 0.6 x 0.6 cm). There were no new nodules. There was an ill-defined wall thickening in the rectosigmoid with surrounding stranding along fascia planes, probably sequela from prior radiation therapy. There was multilevel lumbar impingement due to spondylosis and degenerative disc disease. There was heterogeneous enhancement in the uterus (some possibly from prior radiation therapy).  04/23/2018:PET scan  revealednumerous scattered solid and cavitary nodules in the lungs stable increased in size compared to the prior PET-CT from 11/08/2017.Largest nodule was 1.1 cm in the LUL (SUV 1.9).These demonstratedlow-grade metabolic activity up to a maximum SUV of about 2.3, increased from 11/08/2017.   Case was discussed at tumor board on 04/25/2018. Consensus to pursue CT-guided biopsy (05/06/18) which revealed: Metastatic adenocarcinoma, morphologically consistent with cervical adenocarcinoma.  She has history of chronic hepatitis C which is managed by GI.  Hepatitis C genotype is 2a/2c.  She receives B12 injections for history of B12 deficiency.  On 04/24/2018 she underwent left total knee replacement with Dr. [name].     Malignant neoplasm of overlapping sites of cervix ([place])   09/18/2017 Initial Diagnosis    Malignant neoplasm of overlapping sites of cervix ([place])    05/15/2018 -  Chemotherapy    The patient had palonosetron (ALOXI) injection 0.25 mg, 0.25 mg, Intravenous,  Once, 0 of 6 cycles pegfilgrastim (NEULASTA) injection 6 mg, 6 mg, Subcutaneous, Once, 0 of 6 cycles CARBOplatin (PARAPLATIN) in sodium chloride 0.9 % 100 mL chemo infusion, , Intravenous,  Once, 0 of 6 cycles PACLitaxel (TAXOL) 330 mg in sodium chloride 0.9 % 500 mL chemo infusion (> 49m/m2), 175 mg/m2, Intravenous,  Once, 0 of 6 cycles  for chemotherapy treatment.      Interval history- KEBANY [employer]OWER[employer] 52year old female, who [facility] with reports of diarrhea.  Today, patient reports that she has had intermittent diarrhea up to 12 times per day since her bowel resection.  Previously stopped and restarted on approximately 05/22/2018.  She reports up to 12 episodes of diarrhea per day.  Diarrhea also occurs at night and interferes with sleep.  Associated symptoms: Bowel urgency, nocturnal diarrhea, fecal incontinence, lower abdominal cramping, gas/bloating.  She  has been taking Imodium and questran  without improvement in symptoms.  She does not feel diarrhea is related to food and it occurs spontaneously.  Has prior history of fluctuating diarrhea and constipation.  States abdominal cramping pressure is most bothersome now and she feels tender from frequent diarrhea.  She denies nausea, vomiting, fever, or night sweats.  Denies any blood in her stools.  Does take oral magnesium supplements but denies taking iron or B12 supplements.  Past history significant for chronic anal fissure, chronic hep C, chronic diarrhea.    She was previously seen in [facility] by Rulon Abide, NP on 05/23/2018 for similar.  CT A/P revealed colonic wall thickening consistent with colitis.  Thought to be inflammatory versus infectious.  C. difficile and GI panel were negative.  She was prescribed Flagyl and Cipro which was extended to 10 days per Dr. Mike Gip who saw the patient on 05/28/2018.  She is also received interval IV fluids and electrolytes for symptoms. Was started on bentyl.    ECOG FS:1 - Symptomatic but completely ambulatory  Review of systems- Review of Systems  Constitutional: Positive for malaise/fatigue. Negative for chills, fever and weight loss.  HENT: Negative for congestion, ear discharge, ear pain, sinus pain, sore throat and tinnitus.   Eyes: Negative.   Respiratory: Negative.  Negative for cough, sputum production and shortness of breath.   Cardiovascular: Negative for chest pain, palpitations, orthopnea, claudication and leg swelling.  Gastrointestinal: Positive for abdominal pain, diarrhea (10+ episodes per day), heartburn and nausea. Negative for blood in stool, constipation, melena and vomiting.  Genitourinary: Negative.   Musculoskeletal: Positive for joint pain (left knee- improved).  Skin: Negative.   Neurological: Negative for dizziness, tingling, weakness and headaches.  Endo/Heme/Allergies: Negative.   Psychiatric/Behavioral: The patient has insomnia (can't sleep d/t diarrhea).       Current treatment-anticipate starting carbo-taxol on 06/04/2018.  Treatment held due to diarrhea on 05/28/2018.  Allergies  Allergen Reactions  . Ketamine Anxiety and Other (See Comments)    Syncope episode/confusion     Past Medical History:  Diagnosis Date  . Abnormal cervical Papanicolaou smear 09/18/2017  . Anxiety   . Aortic atherosclerosis ([place])   . Arthritis    neck and knees  . 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 Blood transfusion without reported diagnosis   . Cervical cancer (HCC)    mets lung  . Chronic anal fissure   . Chronic diarrhea   . Dyspnea   . Factor V Leiden mutation ([place])   . Fecal incontinence   . Genital warts   . GERD (gastroesophageal reflux disease)   . Heart murmur   . Hemorrhoids   . 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 History of cancer chemotherapy    completed 06/2017  . History of Clostridium difficile infection    while undergoing chemo.  Negative test 10/2017  . 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 Intestinal infection due to Clostridium difficile 09/18/2017  . Macrocytic anemia with vitamin B12 deficiency   . Perianal condylomata   . Pneumonia    History of  . Pulmonary nodules   . Rectal bleeding   . Small bowel obstruction ([place]) 08/2017  . Stiff neck    limited right turn  . Vitamin D deficiency     Past Surgical History:  Procedure Laterality Date  . CHOLECYSTECTOMY    .  COLON SURGERY  08/2017   resection  . COLONOSCOPY WITH PROPOFOL N/A 12/20/2017   Procedure: COLONOSCOPY WITH PROPOFOL;  Surgeon: [name], MD;  Location[facility] ENDOSCOPY;  Service: Gastroenterology;  Laterality: N/A;  . DIAGNOSTIC LAPAROSCOPY    . ESOPHAGOGASTRODUODENOSCOPY (EGD) WITH PROPOFOL N/A 12/20/2017   Procedure: ESOPHAGOGASTRODUODENOSCOPY (EGD) WITH PROPOFOL;  Surgeon: [name], MD;  Location: [place];  Service: Gastroenterology;  Laterality: N/A;  .  LAPAROTOMY N/A 08/31/2017   Procedure: EXPLORATORY LAPAROTOMY for SBO, ileocolectomy, removal of piece of uterine wall;  Surgeon: [name], MD;  Location: [facility] ORS;  Service: General;  Laterality: N/A;  . LASER ABLATION CONDOLAMATA N/A 02/22/2018   Procedure: LASER ABLATION/REMOVAL OF [id number] AROUND ANUS AND VAGINA;  Surgeon: Michael Boston, MD;  Location: [place];  Service: General;  Laterality: N/A;  . OOPHORECTOMY    . PORTA CATH INSERTION N/A 05/13/2018   Procedure: PORTA CATH INSERTION;  Surgeon: Katha Cabal, MD;  Location: [place] CV LAB;  Service: Cardiovascular;  Laterality: N/A;  . SMALL INTESTINE SURGERY    . TANDEM RING INSERTION     x3  . THORACOTOMY    . TOTAL KNEE ARTHROPLASTY Left 04/24/2018   Procedure: TOTAL KNEE ARTHROPLASTY;  Surgeon: Lovell Sheehan, MD;  Location: [facility] ORS;  Service: Orthopedics;  Laterality: Left;    Social History   Socioeconomic History  . Marital status: Legally Separated    Spouse name: Not on file  . Number of children: Not on file  . Years of education: Not on file  . Highest education level: Not on file  Occupational History  . Not on file  Social Needs  . Financial resource strain: Not on file  . Food insecurity:    Worry: Not on file    Inability: Not on file  . Transportation needs:    Medical: Not on file    Non-medical: Not on file  Tobacco Use  . Smoking status: Former Smoker    Last attempt to quit: 10/16/2006    Years since quitting: 11.6  . Smokeless tobacco: Never Used  Substance and Sexual Activity  . Alcohol use: Yes    Frequency: Never    Comment: seldom  . Drug use: Yes    Types: Marijuana  . Sexual activity: Not Currently    Birth control/protection: Post-menopausal    Comment: Not Asked  Lifestyle  . Physical activity:    Days per week: Not on file    Minutes per session: Not on file  . Stress: Not on file  Relationships  . Social connections:    Talks on phone: Not  on file    Gets together: Not on file    Attends religious service: Not on file    Active member of club or organization: Not on file    Attends meetings of clubs or organizations: Not on file    Relationship status: Not on file  . Intimate partner violence:    Fear of current or ex partner: Not on file    Emotionally abused: Not on file    Physically abused: Not on file    Forced sexual activity: Not on file  Other Topics Concern  . Not on file  Social History Narrative  . Not on file    Family History  Problem Relation Age of Onset  . Hypertension Father   . Diabetes Father   . Alcohol abuse Daughter   . Hypertension Maternal Grandmother   .  Diabetes Maternal Grandmother   . Diabetes Paternal Grandmother   . Hypertension Paternal Grandmother      Current Outpatient Medications:  .  cholestyramine (QUESTRAN) 4 g packet, Take 1 packet (4 g total) by mouth 3 (three) times daily. (Patient taking differently: Take 4 g by mouth 2 (two) times daily. ), Disp: 270 packet, Rfl: 0 .  ciprofloxacin (CIPRO) 500 MG tablet, Take 1 tablet (500 mg total) by mouth 2 (two) times daily., Disp: 6 tablet, Rfl: 0 .  cyanocobalamin (,VITAMIN B-12,) 1000 MCG/ML injection, Inject 1ML every week x 4 weeks, then 1ML every other week x 2 months, then monthly x 3 months (Patient taking differently: Inject 1,000 mcg into the muscle every 30 (thirty) days. Inject 1ML every week x 4 weeks, then 1ML every other week x 2 months, then monthly x 3 months), Disp: 10 mL, Rfl: 1 .  dicyclomine (BENTYL) 10 MG capsule, Take 1 capsule (10 mg total) by mouth 4 (four) times daily -  before meals and at bedtime., Disp: 360 capsule, Rfl: 1 .  dicyclomine (BENTYL) 20 MG tablet, Take 1 tablet (20 mg total) by mouth every 6 (six) hours., Disp: 20 tablet, Rfl: 0 .  fluticasone (FLONASE) 50 MCG/ACT nasal spray, Place 2 sprays into both nostrils daily., Disp: 16 g, Rfl: 0 .  lidocaine-prilocaine (EMLA) cream, Apply 1 application  topically as needed. Apply small amount to port site at least 1 hour prior to it being accessed, cover with plastic wrap, Disp: 30 g, Rfl: 3 .  loratadine (CLARITIN) 10 MG tablet, Take 10 mg by mouth daily., Disp: , Rfl:  .  magnesium oxide (MAG-OX) 400 MG tablet, Take 1 tablet (400 mg total) by mouth daily., Disp: 30 tablet, Rfl: 0 .  metroNIDAZOLE (FLAGYL) 500 MG tablet, Take 1 tablet (500 mg total) by mouth 3 (three) times daily., Disp: 9 tablet, Rfl: 0 .  Multiple Vitamin (MULTIVITAMIN WITH MINERALS) TABS tablet, Take 1 tablet daily by mouth., Disp: , Rfl:  .  ondansetron (ZOFRAN) 8 MG tablet, Take 1 tablet (8 mg total) by mouth every 8 (eight) hours as needed for nausea or vomiting., Disp: 20 tablet, Rfl: 0 .  potassium chloride SA (K-DUR,KLOR-CON) 20 MEQ tablet, Take 1 tablet (20 mEq total) by mouth daily for 3 days., Disp: 3 tablet, Rfl: 0 .  rivaroxaban (XARELTO) 20 MG TABS tablet, Take 1 tablet (20 mg total) by mouth daily with supper., Disp: 30 tablet, Rfl: 1 .  sertraline (ZOLOFT) 50 MG tablet, Take 1 tablet (50 mg total) by mouth daily., Disp: 30 tablet, Rfl: 3 .  Syringe/Needle, Disp, (SYRINGE 3CC/22GX1") 22G X 1" 3 ML MISC, For use with Vitamin B12 injections, Disp: 50 each, Rfl: 0 .  VENTOLIN HFA 108 (90 Base) MCG/ACT inhaler, INHALE 1-2 PUFFS BY MOUTH EVERY 4 HOURS AS NEEDED FOR WHEEZING / SHORTNESS OF BREATH, Disp: , Rfl: 0 .  oxyCODONE 10 MG TABS, Take 1 tablet (10 mg total) by mouth every 4 (four) hours as needed for severe pain (pain score 7-10). (Patient not taking: Reported on 05/28/2018), Disp: 30 tablet, Rfl: 0 No current facility-administered medications for this visit.   Facility-Administered Medications Ordered in Other Visits:  .  heparin lock flush 100 unit/mL, 500 Units, Intravenous, Once, Verlon Au, NP .  sodium chloride flush (NS) 0.9 % injection 10 mL, 10 mL, Intravenous, PRN, Faythe Casa E, NP, 10 mL at 05/23/18 1225 .  sodium chloride flush (NS) 0.9 %  injection 10  mL, 10 mL, Intravenous, PRN, Verlon Au, NP  Physical exam:  Vitals:   05/30/18 0844  BP: 97/65  Pulse: 93  Resp: 18  Temp: (!) 97.2 F (36.2 C)  TempSrc: Tympanic   Physical Exam  Constitutional: She is oriented to person, place, and time. She appears well-developed and well-nourished.  HENT:  Head: Atraumatic.  Nose: Nose normal.  Mouth/Throat: Oropharynx is clear and moist. No oropharyngeal exudate.  Eyes: Conjunctivae are normal. No scleral icterus.  Neck: Normal range of motion.  Cardiovascular: Normal rate, regular rhythm and normal heart sounds.  Pulmonary/Chest: Effort normal and breath sounds normal.  Abdominal: Soft. Bowel sounds are normal. She exhibits no distension. There is tenderness (generalized RLQ and LLQ). There is no rebound and no guarding.  Musculoskeletal: She exhibits tenderness (L knee s/p replacement. Sx scar healing well. Mild redness & swelling. ). She exhibits no edema.  Neurological: She is alert and oriented to person, place, and time.  Skin: Skin is warm and dry.  Psychiatric: She has a normal mood and affect.     CMP Latest Ref Rng & Units 05/28/2018  Glucose 70 - 99 mg/dL 114(H)  BUN 6 - 20 mg/dL 12  Creatinine 0.44 - 1.00 mg/dL 0.76  Sodium 135 - 145 mmol/L 136  Potassium 3.5 - 5.1 mmol/L 3.4(L)  Chloride 98 - 111 mmol/L 104  CO2 22 - 32 mmol/L 23  Calcium 8.9 - 10.3 mg/dL 9.1  Total Protein 6.5 - 8.1 g/dL 7.0  Total Bilirubin 0.3 - 1.2 mg/dL 0.4  Alkaline Phos 38 - 126 U/L 212(H)  AST 15 - 41 U/L 93(H)  ALT 0 - 44 U/L 68(H)   CBC Latest Ref Rng & Units 05/30/2018  WBC 3.6 - 11.0 K/uL 3.6  Hemoglobin 12.0 - 16.0 g/dL 11.7(L)  Hematocrit 35.0 - 47.0 % 34.1(L)  Platelets 150 - 440 K/uL 178    No images are attached to the encounter.  Ct Abdomen Pelvis W Contrast  Result Date: 05/23/2018 CLINICAL DATA:  S/P cervical cancer which spread to lung with chemo and radiation tx-pt is start new chemo tx again next week,  hysterectomy, cholecystectomy, colon surgery. NKI. Pt c/o abdominal pain x 4 days with N/V/D. EXAM: CT ABDOMEN AND PELVIS WITH CONTRAST TECHNIQUE: Multidetector CT imaging of the abdomen and pelvis was performed using the standard protocol following bolus administration of intravenous contrast. CONTRAST:  140m OMNIPAQUE IOHEXOL 300 MG/ML  SOLN COMPARISON:  CT of the abdomen and pelvis on 05/06/2018 FINDINGS: Lower chest: There are numerous rounded and cavitary masses at the lung bases, stable in appearance. Hepatobiliary: There is focal fatty infiltration adjacent to the falciform ligament. The gallbladder is surgically absent. Pancreas: No acute abnormality. Spleen: Normal in size without focal abnormality. Adrenals/Urinary Tract: There is symmetric enhancement and excretion from both kidneys. Suspect nonobstructing intrarenal calculi bilaterally, measuring 2-3 millimeters in diameter. There is no hydronephrosis. Ureters are unremarkable. Urinary bladder is unremarkable. Stomach/Bowel: There is symmetric enhancement and excretion from both kidneys. Suspect nonobstructing intrarenal calculi bilaterally, measuring 2-3 millimeters in diameter. There is no hydronephrosis. Ureters are unremarkable. Urinary bladder is unremarkable. Vascular/Lymphatic: The stomach and small bowel loops are normal in appearance. RIGHT hemicolectomy. The anastomosis appears patent. The wall of the colon is mildly thickened. Air-fluid levels are identified within the colon, consistent with diarrhea. No evidence for perforation or abscess. Stable appearance of perirectal stranding. Reproductive: The uterus is present.  No adnexal mass. Other: No free pelvic fluid. Postoperative changes in the  anterior abdominal wall, otherwise unremarkable. Musculoskeletal: Degenerative changes in the lumbar spine. No suspicious lytic or blastic lesions are identified. IMPRESSION: 1. Colonic wall thickening, consistent with colitis. Considerations include  inflammatory or infectious causes. Ischemic causes would be less likely. RIGHT hemicolectomy. Patent ileocolic anastomosis. 2. Stable rounded and cavitary masses at the lung bases, consistent with known metastatic disease. 3. Nephrolithiasis without acute obstruction. No suspicious renal mass. Electronically Signed   By: Nolon Nations M.D.   On: 05/23/2018 16:17   Ct Biopsy  Result Date: 05/06/2018 INDICATION: History of cervical cancer now with multiple bilateral hypermetabolic pulmonary nodules. Please perform CT-guided biopsy for tissue diagnostic purposes. EXAM: CT GUIDED LEFT UPPER LOBE PULMONARY NODULE BIOPSY COMPARISON:  PET-CT - 04/23/2018 MEDICATIONS: None. ANESTHESIA/SEDATION: Fentanyl 100 mcg IV; Versed 2 mg IV Sedation time: 25 minutes; The patient was continuously monitored during the procedure by the interventional radiology nurse under my direct supervision. CONTRAST:  None COMPLICATIONS: None immediate. PROCEDURE: Informed consent was obtained from the patient following an explanation of the procedure, risks, benefits and alternatives. The patient understands,agrees and consents for the procedure. All questions were addressed. A time out was performed prior to the initiation of the procedure. The patient was positioned supine on the CT table and a limited chest CT was performed for procedural planning demonstrating unchanged size and appearance of the approximately 1.0 x 0.9 cm solid left upper lobe pulmonary nodule (image 16, series 2). Note, nodule was targeted for biopsy given patient's recent postoperative state of left total knee replacement and therefore her difficulty lying various obliquities on the CT gantry. The operative site was prepped and draped in the usual sterile fashion. Under sterile conditions and local anesthesia, a 17 gauge coaxial needle was advanced into the peripheral aspect of the nodule. Positioning was confirmed with intermittent CT fluoroscopy and followed by the  acquisition of 3 core needle biopsies with an 18 gauge core needle biopsy device. The coaxial needle was removed following deployment of a [employer] plug and superficial hemostasis was achieved with manual compression. Limited post procedural chest CT was negative for pneumothorax or additional complication. A dressing was placed. The patient tolerated the procedure well without immediate postprocedural complication. The patient was escorted to have an upright chest radiograph. IMPRESSION: Technically successful CT guided core needle core biopsy of hypermetabolic left upper lobe pulmonary nodule. Electronically Signed   By: Sandi Mariscal M.D.   On: 05/06/2018 12:01   Dg Chest Port 1 View  Result Date: 05/06/2018 CLINICAL DATA:  History of cervical cancer, now with multiple bilateral hypermetabolic pulmonary nodules worrisome for metastatic disease post CT-guided left upper lobe pulmonary nodule biopsy EXAM: PORTABLE CHEST 1 VIEW COMPARISON:  CT-guided left upper lobe pulmonary nodule biopsy-earlier same day FINDINGS: Normal cardiac silhouette and mediastinal contours. Multiple ill-defined nodules are seen bilaterally. No pleural effusion or pneumothorax. No discrete focal airspace opacities. No evidence of edema. No acute osseus abnormalities. IMPRESSION: No evidence of complication following left upper lobe pulmonary nodule biopsy. Specifically, no evidence of pleural effusion or pneumothorax. Electronically Signed   By: Sandi Mariscal M.D.   On: 05/06/2018 11:58    Assessment and plan- Patient is a 52 y.o. female diagnosed with metastatic cervical cancer, who presents to symptom management clinic for diarrhea.    1.  Cervical cancer-recurrent stage I B1 with metastasis to lung.  Plan to start carbo-taxol earlier this week but delayed due to acute diarrhea/colitis (see below).  Currently scheduled for reconsideration of cycle 1 on 06/04/2018  with Dr. Mike Gip.  CBC today stable.  No significant change from  baseline.   2.  Chronic diarrhea: Acutely worse.  Status post right hemicolectomy.  CT on 05/23/2018 showed colonic wall thickening consistent with colitis questionable inflammatory versus infectious.  Ileocolic anastomosis was patent.  C. difficile and GI panel were negative.  Was started on Cipro and Flagyl and Bentyl for abdominal pain.  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  Taking Imodium for as needed symptoms without improvement.  10+ stools per day.  Labs today reviewed.  LFTs improved from recent visit.  Bilirubin decreased.  Potassium stable despite diarrhea-on oral potassium supplementation.  Mag decreased (see below).  Case discussed with primary oncologist, Dr. Mike Gip, who recommended reaching out to GI.  Discussed symptoms with Dr. Marius Ditch, patient's gastroenterologist who recommended increasing Questran to 3 times a day and adding Lomotil q6h. We discussed Sandostatin which she does not recommend at this time is diarrhea this likely associated with bile acid malabsorption.  Complete courses of antibiotics as prescribed.  If persistent diarrhea, may benefit from repeat labs later this week/early next week with electrolyte replacement.  Goal to be resolution of diarrhea/improved control of diarrhea so the patient is able to receive chemo on 06/04/2018.  Appreciate GIs input as she may have diarrhea associated with chemotherapy or we can see her back in symptom management clinic.  3.  Hypomagnesemia-patient was previously started on oral magnesium due to low levels. Mg low (worse) again today at 1.4- likely related to persistent diarrhea.  Recommend stopping oral magnesium as this may be contributing to her diarrhea (GI agreed). Will replace IV magnesium 4 g today in clinic.  If persistent diarrhea will likely need continued replacement.  4.  Orthostatic hypotension- 20 point drop in blood pressure from sitting to standing.  Kidney  function stable. Cr 0.73, bun 10, gfr >60. Likely due to fluid loss associated with diarrhea.  IV fluids given in clinic.  If persistent diarrhea may need additional fluid supplementation.   Follow-up with GI asap. rtc tomorrow for labs +/- fluids &/or electrolytes then again on 8/19 for labs +/- fluids &/or electrolytes (may be able to cancel if good control of diarrhea over weekend).  Follow-up with Dr. Mike Gip as scheduled on 06/04/2018.  Visit Diagnosis 1. Malignant neoplasm of overlapping sites of cervix ([place])   2. Acute diarrhea   3. Bile salt-induced diarrhea   4. Hypomagnesemia   5. Orthostatic hypotension     Patient expressed understanding and was in agreement with this plan. She also understands that She can call clinic at any time with any questions, concerns, or complaints.   Thank you for allowing me to participate in the care of this very pleasant patient.   Beckey Rutter, DNP, AGNP-C [place] [facility] [phone] (work cell) [phone] (office)

## 2018-05-31 ENCOUNTER — Inpatient Hospital Stay: Payer: Medicaid Other

## 2018-05-31 ENCOUNTER — Ambulatory Visit: Payer: Medicaid Other | Admitting: Gastroenterology

## 2018-05-31 ENCOUNTER — Encounter: Payer: Self-pay | Admitting: Gastroenterology

## 2018-05-31 ENCOUNTER — Other Ambulatory Visit: Payer: Self-pay | Admitting: Urgent Care

## 2018-05-31 VITALS — BP 119/74 | HR 61 | Resp 17 | Wt 167.6 lb

## 2018-05-31 DIAGNOSIS — K529 Noninfective gastroenteritis and colitis, unspecified: Secondary | ICD-10-CM

## 2018-05-31 DIAGNOSIS — K9089 Other intestinal malabsorption: Secondary | ICD-10-CM | POA: Diagnosis not present

## 2018-05-31 DIAGNOSIS — R197 Diarrhea, unspecified: Secondary | ICD-10-CM

## 2018-05-31 DIAGNOSIS — K521 Toxic gastroenteritis and colitis: Secondary | ICD-10-CM | POA: Diagnosis not present

## 2018-05-31 DIAGNOSIS — Z5111 Encounter for antineoplastic chemotherapy: Secondary | ICD-10-CM | POA: Diagnosis not present

## 2018-05-31 DIAGNOSIS — T451X5A Adverse effect of antineoplastic and immunosuppressive drugs, initial encounter: Secondary | ICD-10-CM | POA: Diagnosis not present

## 2018-05-31 DIAGNOSIS — C538 Malignant neoplasm of overlapping sites of cervix uteri: Secondary | ICD-10-CM

## 2018-05-31 LAB — BASIC METABOLIC PANEL
ANION GAP: 8 (ref 5–15)
BUN: 7 mg/dL (ref 6–20)
CHLORIDE: 103 mmol/L (ref 98–111)
CO2: 24 mmol/L (ref 22–32)
CREATININE: 0.78 mg/dL (ref 0.44–1.00)
Calcium: 9.4 mg/dL (ref 8.9–10.3)
GFR calc non Af Amer: >60 mL/min (ref >60)
Glucose, Bld: 91 mg/dL (ref 70–99)
Potassium: 4 mmol/L (ref 3.5–5.1)
SODIUM: 135 mmol/L (ref 135–145)

## 2018-05-31 LAB — MAGNESIUM: Magnesium: 1.6 mg/dL — ABNORMAL LOW (ref 1.7–2.4)

## 2018-05-31 MED ORDER — MAGNESIUM SULFATE 2 GM/50ML IV SOLN
2.0000 g | Freq: Once | INTRAVENOUS | Status: AC
  Administered 2018-05-31: 2 g via INTRAVENOUS

## 2018-05-31 MED ORDER — SODIUM CHLORIDE 0.9 % IV SOLN: 2.0000 g | Freq: Once | INTRAVENOUS | Status: DC

## 2018-05-31 MED ORDER — MAGNESIUM SULFATE 2 GM/50ML IV SOLN
INTRAVENOUS | Status: AC
  Filled 2018-05-31: qty 50

## 2018-05-31 MED ORDER — SODIUM CHLORIDE 0.9% FLUSH
10.0000 mL | Freq: Once | INTRAVENOUS | Status: DC | PRN
  Filled 2018-05-31: qty 10

## 2018-05-31 MED ORDER — HEPARIN SOD (PORK) LOCK FLUSH 100 UNIT/ML IV SOLN
500.0000 [IU] | Freq: Once | INTRAVENOUS | Status: AC
  Administered 2018-05-31: 500 [IU] via INTRAVENOUS

## 2018-05-31 MED ORDER — SODIUM CHLORIDE 0.9 % IV SOLN
Freq: Once | INTRAVENOUS | Status: AC
  Administered 2018-05-31: 11:00:00 via INTRAVENOUS
  Filled 2018-05-31: qty 1000

## 2018-05-31 NOTE — Progress Notes (Signed)
[name], MD [address]  [place]  [place], [place] [address]  Main: [phone]  Fax: [phone]    Gastroenterology Consultation  Referring Provider:     Arnetha Courser, MD Primary Care Physician:  Arnetha Courser, MD Primary Gastroenterologist:  Dr. [name] Reason for Consultation:     Chronic diarrhea and lower abdominal pain        HPI:   Joanna Hall is a 52 y.o. female referred by Dr. Hampton Abbot for consultation & management of and chronic unexplained diarrhea. Patient has history of cervical cancer underwent chemotherapy XRT and brachytherapy, appears to be stable, closely followed by oncology at [facility]. She had small bowel obstruction in 08/2017, underwent exploratory laparoscopy with ileocolectomy and primary anastomosis. 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 Currently on Xarelto  Patient reports that since her bowel resection, she has been experiencing several episodes of nonbloody diarrhea, up to 12 times per day and night associated with urgency, nocturnal diarrhea as well as fecal incontinence. She does report diffuse lower abdominal cramps as well as intermittent gas/bloating.patient reports that she may have seen worms in her stools on several occasions. She does drink well water. She denies recent travel outside the [place] or recent use of antibiotics. She did have C. Difficile infection in the past when she was undergoing chemotherapy for cervical cancer. She had stool studies for C. Difficile and other GI pathogens including ova and parasites in 10/2017 by Dr. Hampton Abbot and they were negative.She has tried Imodium and Lomotil with no relief. Therefore she was referred to GI by Dr. Hampton Abbot for further management. Patient denies any particular relation to food. Prior to her surgery, patient reports having  alternating episodes of diarrhea and constipation associated with abdominal cramps and bloating. She would have up to 2-3 soft bowel movements daily but not this severe.her weight has been stable. She denies nausea, vomiting, fever, night sweats. She denies rectal bleeding. She is found to have macrocytic anemia which has been stable. She is not on any B12 or iron supplements.   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 She does not have any evidence of chronic liver disease.  Follow-up visit 12/12/2017 Patient was admitted to the hospital early this month secondary to lower abdominal pain, CT revealed mild rectosigmoid thickening. She reports that her diarrhea resolved after initiation of cholestyramine. She is currently taking 4 g twice daily. Having one bowel movement daily. She denies rectal bleeding. Her anal fissure had also significantly improved since control of her diarrhea. She has not received topical nitroglycerin ointment. However, she continues to have lower abdominal pain, constant with no relation to food or bowel movements. She tried dicyclomine which did not help. She is seeing Dr. Mike Gip for follow-up of lung nodules and is scheduled to have a CT scan in 02/2018. Her disease has not progressed to date. She also has B12 deficiency for which she is taking B12 injections. She stopped taking NSAIDs  Follow-up visit 02/11/2018 Patient was at her weight has been stable. She feels like she  is dependent on Bentyl and cholestyramine for her diarrhea and abdominal pain. Her diarrhea recurs if she does not take cholestyramine. She takes 2 packets daily which in fact leads to constipation. Bentyl  helps with her chronic lower abdominal cramps. Medication for hepatitis C treatment has been approved, and waiting for the medicine to be shipped. She underwent colonoscopy which showed anal condylomata.  Follow up visit 05/31/2018 Patient underwent left knee replacement at [facility] on 04/24/2018. She also had laser ablation of anal condylomata on 02/22/2018 by Dr. Johney Maine. Both procedures went well. 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 Patient was started on chemotherapy on 05/15/2018. She developed abdominal pain with nausea, vomiting and diarrhea for last 2 weeks. She underwent CT abdomen and pelvis which revealed thickening of the colon, underwent stool studies including C. Difficile and other GI pathogen panel which came back negative. Patient is started on ciprofloxacin 3 days ago. I got a call from Ander Purpura, NP from [facility] yesterday about her ongoing diarrhea. I recommended to increase cholestyramine to 3 times daily and add Lomotil. Patient is here as a urgent follow-up of her diarrhea  She took third dose of cholestyramine and took 1 dose of Lomotil last night and she had 1 soft bowel movement this morning. She thinks her diarrhea has slowed down. He otherwise denies any other GI symptoms  She denies smoking or drinking alcohol.   NSAIDs: Has stopped taking NSAIDs  Antiplts/Anticoagulants/Anti thrombotics: on Xarelto for factor V bleeding mutation  GI Procedures: EGD 12/30/2017 - Normal duodenal bulb and second portion of the duodenum. - Erythematous mucosa in the stomach. Biopsied. - Normal gastroesophageal junction and esophagus.  Colonoscopy 12/30/2017 - Perianal condylomata found on perianal exam. - Patent end-to-side ileo-colonic anastomosis, characterized by healthy  appearing mucosa. - The examined portion of the ileum was normal. Biopsied. - Multiple non-bleeding colonic angioectasias. - Normal mucosa in the entire examined colon. Biopsied. DIAGNOSIS:  A. STOMACH, ANTRUM; COLD BIOPSY:  - MILD CHRONIC GASTRITIS AND REGENERATIVE/REPARATIVE CHANGE.  - NEGATIVE FOR H. PYLORI, DYSPLASIA AND MALIGNANCY.   B. STOMACH, BODY; COLD BIOPSY:  - MINIMAL CHRONIC GASTRITIS AND REGENERATIVE/REPARATIVE CHANGE.  - NEGATIVE FOR H. PYLORI, DYSPLASIA AND MALIGNANCY.   Note: The histologic findings in the antrum and body resemble reactive  gastropathy. Etiologies include drugs/chemical injury (NSAIDs vs.  other), bile reflux, and changes adjacent to an area of healing  ulceration. Clinical correlation with endoscopic findings is required.   C. TERMINAL ILEUM; COLD BIOPSY:  - PARTIALLY FRAGMENTED SMALL BOWEL MUCOSA WITH OVERALL INTACT VILLOUS  ARCHITECTURE.  - NEGATIVE FOR INTRAEPITHELIAL LYMPHOCYTOSIS, DYSPLASIA AND MALIGNANCY.   D. RANDOM COLON; COLD BIOPSY:  - COLONIC MUCOSA NEGATIVE FOR MICROSCOPIC COLITIS, DYSPLASIA AND  MALIGNANCY.   Colonoscopy at [facility] 10/05/2016 Impression:           - One diminutive polyp in the cecum,                        removed with a cold biopsy forceps.                        Resected and retrieved.                       - One 5 mm polyp in the cecum,  removed with a jumbo cold forceps.                        Resected and retrieved.                       - One 6 mm polyp in the cecum,                        removed with a hot snare. Resected                        and retrieved.                       - One 5 mm polyp in the descending                        colon, removed with a jumbo cold                        forceps. Resected and retrieved. Date Collected: 10/05/2016 14:48 Diagnosis A.COLON, LABELED AS "CECAL POLYP", BIOPSY:  TUBULAR ADENOMA  SESSILE SERRATED ADENOMA  B.COLON, LABELED  AS "TRANSVERSE COLON POLYP", BIOPSY:  TUBULAR ADENOMA  She denies family history of celiac disease, autoimmune conditions, IBD, GI malignancy  Past Medical History:  Diagnosis Date  . Abnormal cervical Papanicolaou smear 09/18/2017  . Anxiety   . Aortic atherosclerosis ([place])   . Arthritis    neck and knees  . 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 Blood transfusion without reported diagnosis   . Cervical cancer (HCC)    mets lung  . Chronic anal fissure   . Chronic diarrhea   . Dyspnea   . Factor V Leiden mutation ([place])   . Fecal incontinence   . Genital warts   . GERD (gastroesophageal reflux disease)   . Heart murmur   . Hemorrhoids   . 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 History of cancer chemotherapy    completed 06/2017  . History of Clostridium difficile infection    while undergoing chemo.  Negative test 10/2017  . 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 Intestinal infection due to Clostridium difficile 09/18/2017  . Macrocytic anemia with vitamin B12 deficiency   . Perianal condylomata   . Pneumonia    History of  . Pulmonary nodules   . Rectal bleeding   . Small bowel obstruction ([place]) 08/2017  . Stiff neck    limited right turn  . Vitamin D deficiency     Past Surgical History:  Procedure Laterality Date  . CHOLECYSTECTOMY    . COLON SURGERY  08/2017   resection  . COLONOSCOPY WITH PROPOFOL N/A 12/20/2017   Procedure: COLONOSCOPY WITH PROPOFOL;  Surgeon: [name], MD;  Location: [facility];  Service: Gastroenterology;  Laterality: N/A;  . DIAGNOSTIC LAPAROSCOPY    . ESOPHAGOGASTRODUODENOSCOPY (EGD) WITH PROPOFOL N/A 12/20/2017   Procedure: ESOPHAGOGASTRODUODENOSCOPY (EGD) WITH PROPOFOL;  Surgeon: [name], MD;  Location: [place] [place];  Service: Gastroenterology;  Laterality: N/A;  . LAPAROTOMY N/A 08/31/2017   Procedure: EXPLORATORY LAPAROTOMY for SBO, ileocolectomy,  removal of piece of uterine wall;  Surgeon: [name], MD;  Location: [facility] ORS;  Service:  General;  Laterality: N/A;  . LASER ABLATION CONDOLAMATA N/A 02/22/2018   Procedure: LASER ABLATION/REMOVAL OF CONDOLAMATA AROUND ANUS AND VAGINA;  Surgeon: Michael [place], MD;  Location: [place];  Service: General;  Laterality: N/A;  . OOPHORECTOMY    . PORTA CATH INSERTION N/A 05/13/2018   Procedure: PORTA CATH INSERTION;  Surgeon: Katha Cabal, MD;  Location: [facility];  Service: Cardiovascular;  Laterality: N/A;  . SMALL INTESTINE SURGERY    . TANDEM RING INSERTION     x3  . THORACOTOMY    . TOTAL KNEE ARTHROPLASTY Left 04/24/2018   Procedure: TOTAL KNEE ARTHROPLASTY;  Surgeon: Lovell Sheehan, MD;  Location: [facility] ORS;  Service: Orthopedics;  Laterality: Left;    Current Outpatient Medications:  .  cyanocobalamin (,VITAMIN B-12,) 1000 MCG/ML injection, Inject 1ML every week x 4 weeks, then 1ML every other week x 2 months, then monthly x 3 months (Patient taking differently: Inject 1,000 mcg into the muscle every 30 (thirty) days. Inject 1ML every week x 4 weeks, then 1ML every other week x 2 months, then monthly x 3 months), Disp: 10 mL, Rfl: 1 .  dicyclomine (BENTYL) 20 MG tablet, Take 1 tablet (20 mg total) by mouth every 6 (six) hours., Disp: 20 tablet, Rfl: 0 .  diphenoxylate-atropine (LOMOTIL) 2.5-0.025 MG tablet, Take 1 tablet by mouth every 6 (six) hours as needed for up to 14 days for diarrhea or loose stools., Disp: 56 tablet, Rfl: 0 .  fluticasone (FLONASE) 50 MCG/ACT nasal spray, Place 2 sprays into both nostrils daily., Disp: 16 g, Rfl: 0 .  loratadine (CLARITIN) 10 MG tablet, Take 10 mg by mouth daily., Disp: , Rfl:  .  ondansetron (ZOFRAN) 8 MG tablet, Take 1 tablet (8 mg total) by mouth every 8 (eight) hours as needed for nausea or vomiting., Disp: 20 tablet, Rfl: 0 .  oxyCODONE 10 MG TABS, Take 1 tablet (10 mg total) by mouth every 4 (four) hours as  needed for severe pain (pain score 7-10)., Disp: 30 tablet, Rfl: 0 .  potassium chloride SA (K-DUR,KLOR-CON) 20 MEQ tablet, Take 1 tablet (20 mEq total) by mouth daily for 3 days., Disp: 3 tablet, Rfl: 0 .  rivaroxaban (XARELTO) 20 MG TABS tablet, Take 1 tablet (20 mg total) by mouth daily with supper., Disp: 30 tablet, Rfl: 1 .  sertraline (ZOLOFT) 50 MG tablet, Take 1 tablet (50 mg total) by mouth daily., Disp: 30 tablet, Rfl: 3 .  Syringe/Needle, Disp, (SYRINGE 3CC/22GX1") 22G X 1" 3 ML MISC, For use with Vitamin B12 injections, Disp: 50 each, Rfl: 0 .  VENTOLIN HFA 108 (90 Base) MCG/ACT inhaler, INHALE 1-2 PUFFS BY MOUTH EVERY 4 HOURS AS NEEDED FOR WHEEZING / SHORTNESS OF BREATH, Disp: , Rfl: 0 .  cholestyramine (QUESTRAN) 4 g packet, Take 1 packet (4 g total) by mouth 3 (three) times daily. (Patient taking differently: Take 4 g by mouth 2 (two) times daily. ), Disp: 270 packet, Rfl: 0 .  ciprofloxacin (CIPRO) 500 MG tablet, Take 1 tablet (500 mg total) by mouth 2 (two) times daily. (Patient not taking: Reported on 05/31/2018), Disp: 6 tablet, Rfl: 0 .  lidocaine-prilocaine (EMLA) cream, Apply 1 application topically as needed. Apply small amount to port site at least 1 hour prior to it being accessed, cover with plastic wrap (Patient not taking: Reported on 05/31/2018), Disp: 30 g, Rfl: 3 .  magnesium oxide (MAG-OX) 400 MG tablet, Take 1 tablet (400 mg total)  by mouth daily. (Patient not taking: Reported on 05/31/2018), Disp: 30 tablet, Rfl: 0 .  Multiple Vitamin (MULTIVITAMIN WITH MINERALS) TABS tablet, Take 1 tablet daily by mouth., Disp: , Rfl:  No current facility-administered medications for this visit.   Facility-Administered Medications Ordered in Other Visits:  .  magnesium sulfate IVPB 2 g 50 mL, 2 g, Intravenous, Once, Lequita Asal, MD, Stopped at 05/31/18 1148 .  sodium chloride flush (NS) 0.9 % injection 10 mL, 10 mL, Intravenous, PRN, Faythe Casa E, NP, 10 mL at 05/23/18  1225 .  sodium chloride flush (NS) 0.9 % injection 10 mL, 10 mL, Intracatheter, Once PRN, Karen Kitchens, NP   Family History  Problem Relation Age of Onset  . Hypertension Father   . Diabetes Father   . Alcohol abuse Daughter   . Hypertension Maternal Grandmother   . Diabetes Maternal Grandmother   . Diabetes Paternal Grandmother   . Hypertension Paternal Grandmother      Social History   Tobacco Use  . Smoking status: Former Smoker    Last attempt to quit: 10/16/2006    Years since quitting: 11.6  . Smokeless tobacco: Never Used  Substance Use Topics  . Alcohol use: Yes    Frequency: Never    Comment: seldom  . Drug use: Yes    Types: Marijuana    Allergies as of 05/31/2018 - Review Complete 05/31/2018  Allergen Reaction Noted  . Ketamine Anxiety and Other (See Comments) 02/22/2017    Review of Systems:    All systems reviewed and negative except where noted in HPI.   Physical Exam:  BP 119/74 (BP Location: Left Arm, Patient Position: Sitting, Cuff Size: Large)   Pulse 61   Resp 17   Wt 167 lb 9.6 oz (76 kg)   BMI 26.25 kg/m  No LMP recorded. Patient is postmenopausal.  General:   Alert,  Well-developed, well-nourished, pleasant and cooperative in NAD Head:  Normocephalic and atraumatic. Eyes:  Sclera clear, no icterus.   Conjunctiva pink. Ears:  Normal auditory acuity. Nose:  No deformity, discharge, or lesions. Mouth:  No deformity or lesions,oropharynx pink & moist. Neck:  Supple; no masses or thyromegaly. Lungs:  Respirations even and unlabored.  Clear throughout to auscultation.   No wheezes, crackles, or rhonchi. No acute distress. Heart:  Regular rate and rhythm; no murmurs, clicks, rubs, or gallops. Abdomen:  Normal bowel sounds. Soft,mild diffuse lower abdominal tenderness, and non-distended without masses, hepatosplenomegaly or hernias noted.  No guarding or rebound tenderness.   Rectal: Not performed Msk:  Symmetrical without gross deformities.  Good, equal movement & strength bilaterally. Pulses:  Normal pulses noted. Extremities:  No clubbing or edema.  No cyanosis. Neurologic:  Alert and oriented x3;  grossly normal neurologically. Skin:  Intact without significant lesions or rashes. No jaundice. Lymph Nodes:  No significant cervical adenopathy. Psych:  Alert and cooperative. Normal mood and affect.  Imaging Studies: reviewed  Assessment and Plan:   CHELLI YERKES is a 52 y.o. female with history of chronic hepatitis C, treatment nave, cervical adenocarcinoma, status post chemotherapy XRT and brachytherapy, disease currently stable, persistent small bowel obstruction, S/P exploratory laparoscopy with ileocolectomy in 08/2017 seen in follow-up for chronic hepatitis C, chronic unexplained diarrhea, chronic anal fissure as well as symptomatic hemorrhoids  Chronic hep C: Genotype 2a/2c, VL 82,400  Start treatment of hepatitis C with Joanna Hall for 8 weeks, Patient is waiting to finish next cycle of chemotherapy so that the antiviral treatment  is uninterrupted There is no evidence of chronic liver disease She had alkaline phosphatase Isoenzymes and liver component is elevated, GGT 209, elevated Hepatitis B surface antigen negative HIV nonreactive Check hepatitis B surface antibody, core antibody egative Other secondary causes for elevated LFTs are unremarkable  Chronic nonbloody diarrhea: Her history is consistent with bile salt induced diarrhea with history of ileocolectomy. 4g bid cholestyramine is causing constipation. Pancreatic fecal elastase, fecal calprotectin are normal, CRP came back normal. No evidence of microscopic colitis. Worsening of diarrhea after chemotherapy  - Increased cholestyramine to 4 g 3 times daily with meals - Dicyclomine for abdominal cramps   - started Lomotil 1 tablet every 6 hours as needed - avoid NSAIDs  Bilateral lower abdominal pain: CT revealed mild thickening of the rectosigmoid colon Her  abdominal pain is most likely secondary to scar tissue - colonoscopy revealed mild radiation proctitis  Chronic anal fissure and symptomatic hemorrhoids: - Her fissure has significantly improved - I will defer outpatient hemorrhoid ligation for now given her ongoing lower abdominal pain  - she is undergoing removal of anal condylomata by colorectal surgeon at [facility]  B12 deficiency anemia: - Continue B12 injections as recommended - repeat vitamin B12 levels normal - there is no evidence of autoimmune gastritis/pernicious anemia based on EGD - Negative antiparietal cell antibodies, intrinsic factor antibodies   Follow up in 3 months  [name], MD

## 2018-05-31 NOTE — Progress Notes (Signed)
Mg continues to be low today at 1.6. Patient received 4 gm intravenous Mg replacement on 05/30/2018 for a Mg level of 1.4. Will replace with an additional 2 gram bolus today. Infusion aware.   Honor Loh, MSN, APRN, FNP-C, CEN Oncology/Hematology Nurse Practitioner  [facility] 05/31/18, 10:37 AM

## 2018-06-03 ENCOUNTER — Inpatient Hospital Stay: Payer: Medicaid Other

## 2018-06-04 ENCOUNTER — Inpatient Hospital Stay (HOSPITAL_BASED_OUTPATIENT_CLINIC_OR_DEPARTMENT_OTHER): Payer: Medicaid Other | Admitting: Hematology and Oncology

## 2018-06-04 ENCOUNTER — Inpatient Hospital Stay: Payer: Medicaid Other

## 2018-06-04 ENCOUNTER — Other Ambulatory Visit: Payer: Self-pay

## 2018-06-04 ENCOUNTER — Telehealth: Payer: Self-pay | Admitting: *Deleted

## 2018-06-04 ENCOUNTER — Encounter: Payer: Self-pay | Admitting: Hematology and Oncology

## 2018-06-04 VITALS — BP 98/62 | HR 54 | Temp 96.6°F | Resp 18 | Wt 169.0 lb

## 2018-06-04 DIAGNOSIS — C538 Malignant neoplasm of overlapping sites of cervix uteri: Secondary | ICD-10-CM

## 2018-06-04 DIAGNOSIS — Z5111 Encounter for antineoplastic chemotherapy: Secondary | ICD-10-CM | POA: Diagnosis not present

## 2018-06-04 DIAGNOSIS — Z923 Personal history of irradiation: Secondary | ICD-10-CM

## 2018-06-04 DIAGNOSIS — C7801 Secondary malignant neoplasm of right lung: Secondary | ICD-10-CM | POA: Diagnosis not present

## 2018-06-04 DIAGNOSIS — R7989 Other specified abnormal findings of blood chemistry: Secondary | ICD-10-CM

## 2018-06-04 DIAGNOSIS — Z79899 Other long term (current) drug therapy: Secondary | ICD-10-CM

## 2018-06-04 DIAGNOSIS — R197 Diarrhea, unspecified: Secondary | ICD-10-CM

## 2018-06-04 DIAGNOSIS — C7800 Secondary malignant neoplasm of unspecified lung: Secondary | ICD-10-CM

## 2018-06-04 DIAGNOSIS — E538 Deficiency of other specified B group vitamins: Secondary | ICD-10-CM

## 2018-06-04 DIAGNOSIS — E871 Hypo-osmolality and hyponatremia: Secondary | ICD-10-CM | POA: Diagnosis not present

## 2018-06-04 LAB — CBC WITH DIFFERENTIAL/PLATELET
Basophils Absolute: 0 10*3/uL (ref 0–0.1)
Basophils Relative: 1 %
Eosinophils Absolute: 0.1 10*3/uL (ref 0–0.7)
Eosinophils Relative: 2 %
HCT: 33.8 % — ABNORMAL LOW (ref 35.0–47.0)
Hemoglobin: 11.5 g/dL — ABNORMAL LOW (ref 12.0–16.0)
Lymphocytes Relative: 23 %
Lymphs Abs: 0.8 10*3/uL — ABNORMAL LOW (ref 1.0–3.6)
MCH: 33.6 pg (ref 26.0–34.0)
MCHC: 33.9 g/dL (ref 32.0–36.0)
MCV: 99.2 fL (ref 80.0–100.0)
Monocytes Absolute: 0.2 10*3/uL (ref 0.2–0.9)
Monocytes Relative: 7 %
Neutro Abs: 2.3 10*3/uL (ref 1.4–6.5)
Neutrophils Relative %: 67 %
Platelets: 174 10*3/uL (ref 150–440)
RBC: 3.4 MIL/uL — ABNORMAL LOW (ref 3.80–5.20)
RDW: 13.1 % (ref 11.5–14.5)
WBC: 3.5 10*3/uL — ABNORMAL LOW (ref 3.6–11.0)

## 2018-06-04 LAB — COMPREHENSIVE METABOLIC PANEL
ALT: 32 U/L (ref 0–44)
AST: 32 U/L (ref 15–41)
Albumin: 3.6 g/dL (ref 3.5–5.0)
Alkaline Phosphatase: 173 U/L — ABNORMAL HIGH (ref 38–126)
Anion gap: 8 (ref 5–15)
BUN: 15 mg/dL (ref 6–20)
CO2: 23 mmol/L (ref 22–32)
Calcium: 8.9 mg/dL (ref 8.9–10.3)
Chloride: 105 mmol/L (ref 98–111)
Creatinine, Ser: 0.68 mg/dL (ref 0.44–1.00)
GFR calc Af Amer: >60 mL/min (ref >60)
GFR calc non Af Amer: >60 mL/min (ref >60)
Glucose, Bld: 87 mg/dL (ref 70–99)
Potassium: 3.8 mmol/L (ref 3.5–5.1)
Sodium: 136 mmol/L (ref 135–145)
Total Bilirubin: 0.4 mg/dL (ref 0.3–1.2)
Total Protein: 7.1 g/dL (ref 6.5–8.1)

## 2018-06-04 LAB — MAGNESIUM: Magnesium: 1.4 mg/dL — ABNORMAL LOW (ref 1.7–2.4)

## 2018-06-04 MED ORDER — SODIUM CHLORIDE 0.9 % IV SOLN
20.0000 mg | Freq: Once | INTRAVENOUS | Status: AC
  Administered 2018-06-04: 20 mg via INTRAVENOUS
  Filled 2018-06-04: qty 2

## 2018-06-04 MED ORDER — SODIUM CHLORIDE 0.9 % IV SOLN: 4.0000 g | Freq: Once | INTRAVENOUS | Status: DC

## 2018-06-04 MED ORDER — FAMOTIDINE IN NACL 20-0.9 MG/50ML-% IV SOLN
20.0000 mg | Freq: Once | INTRAVENOUS | Status: AC
  Administered 2018-06-04: 20 mg via INTRAVENOUS
  Filled 2018-06-04: qty 50

## 2018-06-04 MED ORDER — DIPHENHYDRAMINE HCL 50 MG/ML IJ SOLN
50.0000 mg | Freq: Once | INTRAMUSCULAR | Status: AC
  Administered 2018-06-04: 50 mg via INTRAVENOUS
  Filled 2018-06-04: qty 1

## 2018-06-04 MED ORDER — SODIUM CHLORIDE 0.9 % IV SOLN
Freq: Once | INTRAVENOUS | Status: AC
  Administered 2018-06-04: 11:00:00 via INTRAVENOUS
  Filled 2018-06-04: qty 1000

## 2018-06-04 MED ORDER — SODIUM CHLORIDE 0.9 % IV SOLN
Freq: Once | INTRAVENOUS | Status: AC
  Filled 2018-06-04: qty 1000

## 2018-06-04 MED ORDER — MAGNESIUM SULFATE 4 GM/100ML IV SOLN
4.0000 g | Freq: Once | INTRAVENOUS | Status: AC
  Administered 2018-06-04: 4 g via INTRAVENOUS
  Filled 2018-06-04: qty 100

## 2018-06-04 MED ORDER — LORAZEPAM 0.5 MG PO TABS: 0.5000 mg | ORAL_TABLET | Freq: Four times a day (QID) | ORAL | 0 refills | Status: DC | PRN

## 2018-06-04 MED ORDER — MAG GLYCINATE 100 MG PO TABS: 100.0000 mg | ORAL_TABLET | Freq: Every day | ORAL | 3 refills | Status: DC

## 2018-06-04 MED ORDER — SODIUM CHLORIDE 0.9 % IV SOLN
140.0000 mg/m2 | Freq: Once | INTRAVENOUS | Status: AC
  Administered 2018-06-04: 264 mg via INTRAVENOUS
  Filled 2018-06-04: qty 44

## 2018-06-04 MED ORDER — HEPARIN SOD (PORK) LOCK FLUSH 100 UNIT/ML IV SOLN
500.0000 [IU] | Freq: Once | INTRAVENOUS | Status: AC | PRN
  Administered 2018-06-04: 500 [IU]
  Filled 2018-06-04: qty 5

## 2018-06-04 MED ORDER — PALONOSETRON HCL INJECTION 0.25 MG/5ML
0.2500 mg | Freq: Once | INTRAVENOUS | Status: AC
  Administered 2018-06-04: 0.25 mg via INTRAVENOUS
  Filled 2018-06-04: qty 5

## 2018-06-04 MED ORDER — SODIUM CHLORIDE 0.9 % IV SOLN
497.2000 mg | Freq: Once | INTRAVENOUS | Status: AC
  Administered 2018-06-04: 500 mg via INTRAVENOUS
  Filled 2018-06-04: qty 50

## 2018-06-04 NOTE — Telephone Encounter (Signed)
Pharmacy unable to get Mag bisglycinate and is asking that something else be ordered in place of it

## 2018-06-04 NOTE — Telephone Encounter (Signed)
No other orders at this time. This is a special formulation that does not cause patient to have diarrhea "as bad". She has been on Mag-ox, but GI felt it was contributory in her chronically loose stools. We will have to support with IV supplementation.   Is this something that they can order? Potentially get from another pharmacy?  Gaspar Bidding

## 2018-06-04 NOTE — Progress Notes (Signed)
Here for follow up. Pain she stated from knee replace surg jaune 10. Walking w cane. No nausea today she stated. " Im doing just fine " she stated.

## 2018-06-04 NOTE — Telephone Encounter (Signed)
  Please call re; Ativan and potential side effects.  No driving on Ativan.  M

## 2018-06-04 NOTE — Progress Notes (Signed)
[facility] day:  06/04/2018   Chief Complaint: Joanna Hall is a 52 y.o. female with a history of cervical cancer s/p chemoradiation who is seen for assessment prior to cycle #1 carboplatin and Taxol.  HPI:  The patient was last seen in the medical oncology clinic on 05/28/2018.  At that time, she was on ciprofloxacin and metronidazole for active colitis. Diarrhea persisted, however it did not differ from patient's baseline bowel habits. She was on Questran daily. Exam was grossly unremarkable.  WBC was 3500 ([place] 2100). Platelets 194,000. Potassium was 3.4. LFTs were elevated; AST 93, ALT 68, and ALP 212.  We discussed completing a 10 day course of antibiotics prior to initiation of chemotherapy. Antibiotics completed on 06/02/2018.  She was seen by Beckey Rutter, NP in the symptom management clinic on 05/30/2018 for worsening diarrhea.  She was having 10+ episodes/day. Dr. Marius Ditch, the patient's gastroenterologist was consulted.  Questran was increased to 3x/day.  Lomotil every 6 hours was added.  Oral magnesium was discontinued.  She has had ongoing electrolytes wasting.  Magnesium was 1.4.  She received 4 gm of IV magnesium.  In addition, she received IVF for orthostatic hypotension.  Patient returned to the infusion center on 05/31/2018 for labs monitoring. Mg low at 1.6. She was given an additional 2 gm of IV magnesium replacement.   During the interim, patient has been feeling "a lot better". Diarrhea has resolved. Stools are semi-formed. Last diarrhea episode was on 06/01/2018. She is not using the Lomotil at this point, however continues on Questran BID. Patient continues on the prescribed B[place]l. She denies hematochezia. Stools have not contained mucous. No fevers.   Patient denies that she has experienced any B symptoms. Patient advises that she maintains an adequate appetite. She is eating well. Weight today is 169 lb (76.7 kg), which  compared to her last visit to the clinic, represents a  2 pound increase.   Patient denies pain in the clinic today.   Past Medical History:  Diagnosis Date  . Abnormal cervical Papanicolaou smear 09/18/2017  . Anxiety   . Aortic atherosclerosis ([place])   . Arthritis    neck and knees  . 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 Blood transfusion without reported diagnosis   . Cervical cancer (HCC)    mets lung  . Chronic anal fissure   . Chronic diarrhea   . Dyspnea   . Factor V Leiden mutation ([place])   . Fecal incontinence   . Genital warts   . GERD (gastroesophageal reflux disease)   . Heart murmur   . Hemorrhoids   . 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 History of cancer chemotherapy    completed 06/2017  . History of Clostridium difficile infection    while undergoing chemo.  Negative test 10/2017  . 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 Intestinal infection due to Clostridium difficile 09/18/2017  . Macrocytic anemia with vitamin B12 deficiency   . Perianal condylomata   . Pneumonia    History of  . Pulmonary nodules   . Rectal bleeding   . Small bowel obstruction ([place]) 08/2017  . Stiff neck    limited right turn  . Vitamin D deficiency     Past Surgical History:  Procedure Laterality Date  . CHOLECYSTECTOMY    . COLON SURGERY  08/2017   resection  . COLONOSCOPY WITH PROPOFOL N/A 12/20/2017   Procedure: COLONOSCOPY WITH PROPOFOL;  Surgeon: [name], MD;  Location: [facility] ENDOSCOPY;  Service: Gastroenterology;  Laterality: N/A;  . DIAGNOSTIC LAPAROSCOPY    . ESOPHAGOGASTRODUODENOSCOPY (EGD) WITH PROPOFOL N/A 12/20/2017   Procedure: ESOPHAGOGASTRODUODENOSCOPY (EGD) WITH PROPOFOL;  Surgeon: [name], MD;  Location: [facility];  Service: Gastroenterology;  Laterality: N/A;  . LAPAROTOMY N/A 08/31/2017   Procedure: EXPLORATORY LAPAROTOMY for SBO, ileocolectomy, removal of piece of  uterine wall;  Surgeon: [name], MD;  Location: [facility] ORS;  Service: General;  Laterality: N/A;  . LASER ABLATION CONDOLAMATA N/A 02/22/2018   Procedure: LASER ABLATION/REMOVAL OF UXNATFTDDUK AROUND ANUS AND VAGINA;  Surgeon: Michael Boston, MD;  Location: [facility];  Service: General;  Laterality: N/A;  . OOPHORECTOMY    . PORTA CATH INSERTION N/A 05/13/2018   Procedure: PORTA CATH INSERTION;  Surgeon: Katha Cabal, MD;  Location: [facility];  Service: Cardiovascular;  Laterality: N/A;  . SMALL INTESTINE SURGERY    . TANDEM RING INSERTION     x3  . THORACOTOMY    . TOTAL KNEE ARTHROPLASTY Left 04/24/2018   Procedure: TOTAL KNEE ARTHROPLASTY;  Surgeon: Lovell Sheehan, MD;  Location: [facility] ORS;  Service: Orthopedics;  Laterality: Left;    Family History  Problem Relation Age of Onset  . Hypertension Father   . Diabetes Father   . Alcohol abuse Daughter   . Hypertension Maternal Grandmother   . Diabetes Maternal Grandmother   . Diabetes Paternal Grandmother   . Hypertension Paternal Grandmother     Social History:  reports that she quit smoking about 11 years ago. She has never used smokeless tobacco. She reports that she drinks alcohol. She reports that she has current or past drug history. Drug: Marijuana.  The patient's 21 year old daughter died of alcoholism.  The patient is originally from [place].  She moved from [place] to [place]. She is alone today.  Allergies:  Allergies  Allergen Reactions  . Ketamine Anxiety and Other (See Comments)    Syncope episode/confusion     Current Medications: Current Outpatient Medications  Medication Sig Dispense Refill  . cholestyramine (QUESTRAN) 4 g packet Take 1 packet (4 g total) by mouth 3 (three) times daily. (Patient taking differently: Take 4 g by mouth 2 (two) times daily. ) 270 packet 0  . cyanocobalamin (,VITAMIN B-12,) 1000 MCG/ML injection Inject 1ML every week x 4 weeks,  then 1ML every other week x 2 months, then monthly x 3 months (Patient taking differently: Inject 1,000 mcg into the muscle every 30 (thirty) days. Inject 1ML every week x 4 weeks, then 1ML every other week x 2 months, then monthly x 3 months) 10 mL 1  . dicyclomine (BENTYL) 20 MG tablet Take 1 tablet (20 mg total) by mouth every 6 (six) hours. 20 tablet 0  . diphenoxylate-atropine (LOMOTIL) 2.5-0.025 MG tablet Take 1 tablet by mouth every 6 (six) hours as needed for up to 14 days for diarrhea or loose stools. 56 tablet 0  . fluticasone (FLONASE) 50 MCG/ACT nasal spray Place 2 sprays into both nostrils daily. 16 g 0  . lidocaine-prilocaine (EMLA) cream Apply 1 application topically as needed. Apply small amount to port site at least 1 hour prior to it being accessed, cover with plastic wrap 30 g 3  . loratadine (CLARITIN) 10 MG tablet Take 10 mg by mouth daily.    Marland Kitchen  ondansetron (ZOFRAN) 8 MG tablet Take 1 tablet (8 mg total) by mouth every 8 (eight) hours as needed for nausea or vomiting. 20 tablet 0  . oxyCODONE 10 MG TABS Take 1 tablet (10 mg total) by mouth every 4 (four) hours as needed for severe pain (pain score 7-10). 30 tablet 0  . rivaroxaban (XARELTO) 20 MG TABS tablet Take 1 tablet (20 mg total) by mouth daily with supper. 30 tablet 1  . sertraline (ZOLOFT) 50 MG tablet Take 1 tablet (50 mg total) by mouth daily. 30 tablet 3  . magnesium oxide (MAG-OX) 400 MG tablet Take 1 tablet (400 mg total) by mouth daily. (Patient not taking: Reported on 05/31/2018) 30 tablet 0  . Multiple Vitamin (MULTIVITAMIN WITH MINERALS) TABS tablet Take 1 tablet daily by mouth.    . potassium chloride SA (K-DUR,KLOR-CON) 20 MEQ tablet Take 1 tablet (20 mEq total) by mouth daily for 3 days. 3 tablet 0  . Syringe/Needle, Disp, (SYRINGE 3CC/22GX1") 22G X 1" 3 ML MISC For use with Vitamin B12 injections (Patient not taking: Reported on 06/04/2018) 50 each 0  . VENTOLIN HFA 108 (90 Base) MCG/ACT inhaler INHALE 1-2 PUFFS  BY MOUTH EVERY 4 HOURS AS NEEDED FOR WHEEZING / SHORTNESS OF BREATH  0   No current facility-administered medications for this visit.    Facility-Administered Medications Ordered in Other Visits  Medication Dose Route Frequency Provider Last Rate Last Dose  . sodium chloride flush (NS) 0.9 % injection 10 mL  10 mL Intravenous PRN Jacquelin Hawking, NP   10 mL at 05/23/18 1225    Review of Systems  Constitutional: Negative.  Negative for diaphoresis, fever, malaise/fatigue and weight loss.       Feels better.  Back to baseline health.  HENT: Negative.   Eyes: Negative.   Respiratory: Positive for shortness of breath (exertional - improved). Negative for cough, hemoptysis and sputum production.   Cardiovascular: Negative.  Negative for chest pain, palpitations, orthopnea, leg swelling and PND.  Gastrointestinal: Positive for abdominal pain (mild chronic tenderness) and diarrhea (1 formed BM/day on daily Questran). Negative for blood in stool, constipation, melena, nausea and vomiting.  Genitourinary: Negative.  Negative for dysuria, frequency, hematuria and urgency.  Musculoskeletal: Positive for joint pain (OA in shoulders and neck. Recent LEFT TKR - healing well.). Negative for back pain, falls and myalgias.  Skin: Negative.  Negative for itching and rash.  Neurological: Negative.  Negative for dizziness, tremors, weakness and headaches.  Endo/Heme/Allergies: Negative.  Does not bruise/bleed easily.  Psychiatric/Behavioral: Negative for depression, memory loss and suicidal ideas. The patient is not nervous/anxious and does not have insomnia.   All other systems reviewed and are negative.  Performance status (ECOG): 1 - 2  Vital Signs BP 98/62 (BP Location: Left Arm, Patient Position: Sitting) Comment: 101/64-standing   64-p  Pulse (!) 54   Temp (!) 96.6 F (35.9 C) (Tympanic)   Resp 18   Wt 169 lb (76.7 kg)   BMI 26.47 kg/m   Physical Exam  Constitutional: She is oriented to  person, place, and time and well-developed, well-nourished, and in no distress.  She has a cane at her side.  HENT:  Head: Normocephalic and atraumatic.  Brown hair  Eyes: Pupils are equal, round, and reactive to light. EOM are normal. No scleral icterus.  Blue eyes  Neck: Normal range of motion. Neck supple. No tracheal deviation present. No thyromegaly present.  Cardiovascular: Normal rate, regular rhythm and normal heart sounds.  Exam reveals no gallop and no friction rub.  No murmur heard. Pulmonary/Chest: Effort normal and breath sounds normal. No respiratory distress. She has no wheezes. She has no rales.  Abdominal: Soft. Bowel sounds are normal. She exhibits no distension and no mass. There is no tenderness. There is no rebound and no guarding.  Musculoskeletal: Normal range of motion. She exhibits no edema or tenderness.  Lymphadenopathy:    She has no cervical adenopathy.    She has no axillary adenopathy.       Right: No inguinal and no supraclavicular adenopathy present.       Left: No inguinal and no supraclavicular adenopathy present.  Neurological: She is alert and oriented to person, place, and time.  Skin: Skin is warm and dry. No rash noted. No erythema.  Psychiatric: Mood, affect and judgment normal.  Nursing note and vitals reviewed.   Imaging studies: 06/2017:  PET scan revealed enlarging pulmonary nodules but no evidence of abdominal disease per patient report. 09/13/2017:  Abdomen and pelvic CT revealed a small infarct in the inferior pole of the LEFT kidney and RIGHT uterine infarct. 11/08/2017:  PET scan at the [place] [facility] revealed innumerable stable bilateral cavitary pulmonary nodules (up to 8 mm), some of which demonstrate low level metabolic activity.  There were post treatment changes in the pelvis without evidence of suspicious hypermetabolic activity. 12/03/2017:  Abdomen and pelvic CT revealed continued multiple pulmonary nodules in  the lung bases concerning for metastatic disease.  There was mild wall thickening of rectosigmoid colon is noted.  There was a stable 1.8 x 1.6 cm mixed fat and soft tissue density noted in right lower quadrant which may represent fat necrosis.  There was a larger low density is noted involving uterine fundus. 12/03/2017:  Pelvic MRI was unremarkable.   04/15/2018:  Chest, abdomen, and pelvis CT revealed innumerable (> 100) cavitary nodules scattered in the lungs, moderately enlarging compared to the 11/08/2017 PET-CT, suspicious for metastatic disease.  One index node in the RLL measures 1.0 x 1.1 cm (previously 0.6 x 0.6 cm).  There were no new nodules.  There was an ill-defined wall thickening in the rectosigmoid with surrounding stranding along fascia planes, probably sequela from prior radiation therapy.  There was multilevel lumbar impingement due to spondylosis and degenerative disc disease.  There was heterogeneous enhancement in the uterus (some possibly from prior radiation therapy). 04/23/2018:  PET scan revealed numerous scattered solid and cavitary nodules in the lungs stable increased in size compared to the prior PET-CT from 11/08/2017. Largest nodule was 1.1 cm in the LUL (SUV 1.9).  These demonstrated low-grade metabolic activity up to a maximum SUV of about 2.3, increased from 11/08/2017.    Appointment on 06/04/2018  Component Date Value Ref Range Status  . Magnesium 06/04/2018 1.4* 1.7 - 2.4 mg/dL Final   Performed at [facility], [address] [place], [place] [address]  . Sodium 06/04/2018 136  135 - 145 mmol/L Final  . Potassium 06/04/2018 3.8  3.5 - 5.1 mmol/L Final  . Chloride 06/04/2018 105  98 - 111 mmol/L Final  . CO2 06/04/2018 23  22 - 32 mmol/L Final  . Glucose, Bld 06/04/2018 87  70 - 99 mg/dL Final  . BUN 06/04/2018 15  6 - 20 mg/dL Final  . Creatinine, Ser 06/04/2018 0.68  0.44 - 1.00 mg/dL Final  . Calcium 06/04/2018 8.9  8.9 - 10.3 mg/dL Final  . Total  Protein 06/04/2018 7.1  6.5 - 8.1 g/dL Final  . Albumin 06/04/2018 3.6  3.5 - 5.0 g/dL Final  . AST 06/04/2018 32  15 - 41 U/L Final  . ALT 06/04/2018 32  0 - 44 U/L Final  . Alkaline Phosphatase 06/04/2018 173* 38 - 126 U/L Final  . Total Bilirubin 06/04/2018 0.4  0.3 - 1.2 mg/dL Final  . GFR calc non Af Amer 06/04/2018 >60  >60 mL/min Final  . GFR calc Af Amer 06/04/2018 >60  >60 mL/min Final   Comment: (NOTE) The eGFR has been calculated using the CKD EPI equation. This calculation has not been validated in all clinical situations. eGFR's persistently <60 mL/min signify possible Chronic Kidney Disease.   Georgiann Hahn gap 06/04/2018 8  5 - 15 Final   Performed at [facility], [address] [place], [place] [address]  . WBC 06/04/2018 3.5* 3.6 - 11.0 K/uL Final  . RBC 06/04/2018 3.40* 3.80 - 5.20 MIL/uL Final  . Hemoglobin 06/04/2018 11.5* 12.0 - 16.0 g/dL Final  . HCT 06/04/2018 33.8* 35.0 - 47.0 % Final  . MCV 06/04/2018 99.2  80.0 - 100.0 fL Final  . MCH 06/04/2018 33.6  26.0 - 34.0 pg Final  . MCHC 06/04/2018 33.9  32.0 - 36.0 g/dL Final  . RDW 06/04/2018 13.1  11.5 - 14.5 % Final  . Platelets 06/04/2018 174  150 - 440 K/uL Final  . Neutrophils Relative % 06/04/2018 67  % Final  . Neutro Abs 06/04/2018 2.3  1.4 - 6.5 K/uL Final  . Lymphocytes Relative 06/04/2018 23  % Final  . Lymphs Abs 06/04/2018 0.8* 1.0 - 3.6 K/uL Final  . Monocytes Relative 06/04/2018 7  % Final  . Monocytes Absolute 06/04/2018 0.2  0.2 - 0.9 K/uL Final  . Eosinophils Relative 06/04/2018 2  % Final  . Eosinophils Absolute 06/04/2018 0.1  0 - 0.7 K/uL Final  . Basophils Relative 06/04/2018 1  % Final  . Basophils Absolute 06/04/2018 0.0  0 - 0.1 K/uL Final   Performed at [facility], [address], [place], [place] [address]    Assessment:  Joanna Hall is a 52 y.o. female with a history of stage IB1 cervical cancer s/p concurrent cisplatin and radiation (external beam and  brachytherapy) from 11/2016 - 02/2017.    She was treated by Dr. Christene Slates at [facility] in [place], [place].  In 01/2017, she received cisplatin x 2 and carboplatin x 1 (01/29/2017) due to ARF and XRT. Radiation was followed by T & O on 02/01/2017 and T & N on 02/10/2017 and 02/20/2017.  Course was complicated by weight loss (80 pounds), nausea, vomiting, electrolyte wasting (potassium and magnesium), and multiple hospitalizations.  PET scan in 06/2017 revealed enlarging pulmonary nodules but no evidence of abdominal disease per patient report.  She is s/p right ileocolectomy on 08/31/2017 for a small bowel obstruction.  She presented with abdominal pain.  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  Chest, abdomen, and pelvis CT on 04/15/2018 revealed innumerable (> 100) cavitary nodules scattered in the lungs, moderately enlarging compared to the 11/08/2017 PET-CT, suspicious for metastatic disease. One index node in the RLL measures 1.0 x 1.1 cm.  There were no new nodules.  There was an ill-defined wall thickening in the rectosigmoid with surrounding stranding along fascia planes, probably sequela from prior radiation therapy.  There was multilevel lumbar  impingement due to spondylosis and degenerative disc disease.  There was heterogeneous enhancement in the uterus (some possibly from prior radiation therapy).  PET scan on 04/23/2018 revealed numerous scattered solid and cavitary nodules in the lungs stable increased in size compared to the prior PET-CT from 11/08/2017. Largest nodule was 1.1 cm in the LUL (SUV 1.9).  These demonstrated low-grade metabolic activity up to a maximum SUV of about 2.3, increased from 11/08/2017.   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  Testing was negative for prothrombin gene mutation, lupus anticoagulant panel, anticardiolipin antibodies, and beta-2 glycoprotein antibodies.  She was discharged on Xarelto.  She was admitted to [facility] from 12/03/2017 - 12/08/2017 with abdominal pain. She was not felt to have a uterine infarct.  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  The etiology was unclear.  She is hepatitis C positive.   Hepatitis C genotype is 2a/2c.  She has B12 deficiency.  B12 was 165 on 11/27/2017.  She began B12 injections on 11/29/2017.  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  She was diagnosed with colitis.  Abdomen and pelvic CT on 05/23/2018 revealed colonic wall thickening, consistent with colitis. Considerations include inflammatory or infectious causes. Ischemic causes would be less likely. She is s/p RIGHT hemicolectomy. She had a patent ileocolic anastomosis.  She received a 10 day course of ciprofloxacin and Flagyl.  She has had issues with chronic diarrhea.  She is Questran.  Symptomatically, patient is doing well overall. Diarrhea has resolved following treatment with ABX for colitis. She is on Questran daily. Exam is grossly unremarkable.  WBC 3500 ([place] 2300). Platelets 174,000. Potassium 3.8. LFTs are mildly elevated (AST 32, ALT 32, and ALP 173).  Plan:   1. Labs today:  CBC with diff, CMP, Mg. 2. Cervical cancer - treatment ongoing  See plans for treatment.  Patient to receive paclitaxel + carboplatin every 3 weeks x 6 cycles.  Discussed side effects and fielded questions.  Patient at an increased risk for myelosuppression, alopecia, allergic reactions, electrolyte wasting (potassium and magnesium), neuropathy, and nail changes.  Chemotherapy regimen carries an anticipated > 50% response rate. Discuss starting out with an 80% dose to ensure  tolerability and prevent issues with neutropenia, as patient has had significant issues in the past.   She will receive Udencya.  Labs reviewed. Blood counts stable and adequate enough for treatment. Will proceed with cycle #1 paclitaxel + carboplatin today.  Discuss symptom management.  Patient has antiemetics and pain medications at home to use on a PRN basis. Patient  advising that the  prescribed interventions are adequate at this point. Continue all medications as previously prescribed.  3. Elevated LFTs - improved  AST 32, ALT 32, ALP 173, and total bilirubin 0.4. 4. Colitis - resolved  Patient was treated with a 10-day course of ciprofloxacin and metronidazole.  Antibiotics have completed.  And patient symptoms have resolved.  She was using Bentyl on a as needed basis for abdominal cramping. 5. Electrolyte derangements - ongoing monitoring  Discuss low potassium. Potassium level 3.8 today.   Discuss low magnesium. Magnesium 1.4 today. Will replace with 4 gm IV magnesium today.   Discuss Mg glycinate as being less likely to cause diarrhea. Patient willing to try. Rx Mg glycinate 100 mg daily (Disp #30).   Continue routine lab monitoring.  6. B12 deficiency - stable  Patient continues on monthly parenteral B12 supplementation.  She self administers vitamin supplementation at home.  Continue as previously prescribed. 7. RTC on 06/05/2018 for labs (BMP, Mg), Udencya, +/- IV Mg 8. RTC on 06/07/2018 for labs (Mg) and +/- IV Mg.  9. RTC on 06/10/2018 for labs (BMP, Mg) and +/- IV Mg 10. RTC on 06/14/2018 for nadir assessment, labs (CBC with diff, CMP, Mg) 11. RTC on 06/25/2018 for MD assessment, labs (CBC with diff, CMP, Mg) and cycle #2 paclitaxel + carboplatin   Honor Loh, NP  06/04/2018, 9:57 AM   I saw and evaluated the patient, participating in the key portions of the service and reviewing pertinent diagnostic studies and records.  I reviewed the nurse practitioner's note and  agree with the findings and the plan.  The assessment and plan were discussed with the patient.  Multiple questions were asked by the patient and answered.   Nolon Stalls, MD 06/04/2018,9:57 AM

## 2018-06-04 NOTE — Telephone Encounter (Signed)
I spoke with pharmacist who will check around to see if any one else has it and let us know

## 2018-06-05 ENCOUNTER — Inpatient Hospital Stay: Payer: Medicaid Other

## 2018-06-05 ENCOUNTER — Telehealth: Payer: Self-pay | Admitting: *Deleted

## 2018-06-05 NOTE — Telephone Encounter (Signed)
-----   Message from Karen Kitchens, NP sent at 06/04/2018  6:58 PM EDT ----- She was in infusion until [place].   Cant do Udencya as planned tomorrow because it has to be 24 hours AFTER her chemotherapy. I went ahead and cancelled tomorrow's appointments. Will have her come in on Thursday for labs, Udencya, and +/- IV Mg. I put in the new appointments. She just needs to know the times.   Gaspar Bidding

## 2018-06-05 NOTE — Telephone Encounter (Signed)
Called patient to inform her that she has a lab appointment on 8-22 @ 8:15 with infusion for +/- Magnesium @ 8:45.  Patient verbalized understanding.

## 2018-06-06 ENCOUNTER — Inpatient Hospital Stay: Payer: Medicaid Other

## 2018-06-06 ENCOUNTER — Telehealth: Payer: Self-pay | Admitting: *Deleted

## 2018-06-06 VITALS — BP 107/67 | HR 88 | Temp 97.4°F | Resp 20

## 2018-06-06 DIAGNOSIS — C538 Malignant neoplasm of overlapping sites of cervix uteri: Secondary | ICD-10-CM

## 2018-06-06 DIAGNOSIS — Z5111 Encounter for antineoplastic chemotherapy: Secondary | ICD-10-CM | POA: Diagnosis not present

## 2018-06-06 LAB — BASIC METABOLIC PANEL
Anion gap: 7 (ref 5–15)
BUN: 11 mg/dL (ref 6–20)
CO2: 23 mmol/L (ref 22–32)
Calcium: 8.3 mg/dL — ABNORMAL LOW (ref 8.9–10.3)
Chloride: 108 mmol/L (ref 98–111)
Creatinine, Ser: 0.71 mg/dL (ref 0.44–1.00)
GFR calc Af Amer: >60 mL/min (ref >60)
GFR calc non Af Amer: >60 mL/min (ref >60)
Glucose, Bld: 88 mg/dL (ref 70–99)
Potassium: 4 mmol/L (ref 3.5–5.1)
Sodium: 138 mmol/L (ref 135–145)

## 2018-06-06 LAB — MAGNESIUM: Magnesium: 1.7 mg/dL (ref 1.7–2.4)

## 2018-06-06 MED ORDER — SODIUM CHLORIDE 0.9% FLUSH
10.0000 mL | Freq: Once | INTRAVENOUS | Status: AC
  Administered 2018-06-06: 10 mL via INTRAVENOUS
  Filled 2018-06-06: qty 10

## 2018-06-06 MED ORDER — HEPARIN SOD (PORK) LOCK FLUSH 100 UNIT/ML IV SOLN
500.0000 [IU] | Freq: Once | INTRAVENOUS | Status: AC
  Administered 2018-06-06: 500 [IU] via INTRAVENOUS
  Filled 2018-06-06: qty 5

## 2018-06-06 MED ORDER — PEGFILGRASTIM INJECTION 6 MG/0.6ML ~~LOC~~
6.0000 mg | PREFILLED_SYRINGE | Freq: Once | SUBCUTANEOUS | Status: AC
  Administered 2018-06-06: 6 mg via SUBCUTANEOUS
  Filled 2018-06-06: qty 0.6

## 2018-06-06 NOTE — Telephone Encounter (Signed)
Called patient to inquire if she is taking calcium.  She states she is not.  I advised her per MD to start 1200 mg daily. She states she will pick up a bottle and start taking.

## 2018-06-06 NOTE — Progress Notes (Signed)
Magnesium: 1.7 today. NP, Honor Loh, notified and aware. Per NP order: no Magnesium infusion needed today. Patient to receive Neulasta injection and then may be discharged to home. Patient returning to clinic tomorrow, 06/07/18.

## 2018-06-07 ENCOUNTER — Telehealth: Payer: Self-pay | Admitting: Urgent Care

## 2018-06-07 ENCOUNTER — Encounter: Payer: Self-pay | Admitting: Hematology and Oncology

## 2018-06-07 ENCOUNTER — Inpatient Hospital Stay: Payer: Medicaid Other

## 2018-06-07 DIAGNOSIS — C538 Malignant neoplasm of overlapping sites of cervix uteri: Secondary | ICD-10-CM

## 2018-06-07 DIAGNOSIS — Z5111 Encounter for antineoplastic chemotherapy: Secondary | ICD-10-CM | POA: Diagnosis not present

## 2018-06-07 LAB — MAGNESIUM: Magnesium: 1.7 mg/dL (ref 1.7–2.4)

## 2018-06-07 MED ORDER — SODIUM CHLORIDE 0.9% FLUSH
10.0000 mL | Freq: Once | INTRAVENOUS | Status: AC
  Administered 2018-06-07: 10 mL via INTRAVENOUS
  Filled 2018-06-07: qty 10

## 2018-06-07 MED ORDER — TRAMADOL HCL 50 MG PO TABS: 50.0000 mg | ORAL_TABLET | Freq: Four times a day (QID) | ORAL | 0 refills | Status: DC | PRN

## 2018-06-07 MED ORDER — HEPARIN SOD (PORK) LOCK FLUSH 100 UNIT/ML IV SOLN
500.0000 [IU] | Freq: Once | INTRAVENOUS | Status: AC
  Administered 2018-06-07: 500 [IU] via INTRAVENOUS
  Filled 2018-06-07: qty 5

## 2018-06-07 NOTE — Telephone Encounter (Signed)
Narcotic request: 06/07/18  Patient contacted the clinic to request intervention for pain be sent in. Patient experiencing significant bone pain related to recent Udencya injection. She has been taking APAP, however notes that it has not been effective. Allergy list reviewed.   [place] CSRS database reviewed prior to consideration of refills:  NARX scores --    Narcotic: 480  Sedative: 311  30 day average MME/day: 69.40  30 day average LME/day: 0.19  ORS score (range 0-999): 680  Providers outside of this practice prescribing narcotics/sedatives in the last 30 days: YES  Prescriber Role in care Practice location Drug prescribed Disp # Disp date  Kurtis Bushman, MD [name] Oxycodone 10 mg  30 05/28/2018  Kurtis Bushman, MD [name] Dilaudid 4 mg 42 05/20/2018  Carlynn Spry, PA [name] Dilaudid 4 mg  42  05/07/2018   Given a current oncological diagnosis, this patient has the potential to experience significant cancer related pain. Patient is on active chemotherapy treatments. Benefits versus risks associated with opioid therapy considered. Willing to oblige request for pain medications at this time. Patient educated that medications should not be bitten, chewed, or crushed. Additionally, safety precautions reviewed. Patient verbalized understanding that medications should not be sold or shared, taken with alcohol, or used while driving. She has been made aware of the side effects of using this medication. Patient understands that this medication can cause CNS depression, increase her risk of falls, and even lead to overdose that may result in death, if used outside of the parameters that she and I discussed. With all of this in mind, she accepts the risks and responsibilities associated with therapy and elects to continue to use the prescribed interventions.   Prescription sent in for: 1. Tramadol 50 mg q6h PRN (Disp #30)   Honor Loh, MSN, APRN,  FNP-C, CEN Oncology/Hematology Nurse Practitioner  [facility] 06/07/18, 1:55 PM

## 2018-06-10 ENCOUNTER — Other Ambulatory Visit: Payer: Self-pay

## 2018-06-10 ENCOUNTER — Inpatient Hospital Stay
Admission: EM | Admit: 2018-06-10 | Discharge: 2018-06-12 | DRG: 392 | Disposition: A | Discharge status: Home / Self Care | Payer: Medicaid Other | Attending: Internal Medicine | Admitting: Internal Medicine

## 2018-06-10 ENCOUNTER — Other Ambulatory Visit: Payer: Self-pay | Admitting: Urgent Care

## 2018-06-10 ENCOUNTER — Inpatient Hospital Stay: Payer: Medicaid Other

## 2018-06-10 ENCOUNTER — Emergency Department: Payer: Medicaid Other

## 2018-06-10 ENCOUNTER — Encounter: Payer: Self-pay | Admitting: Emergency Medicine

## 2018-06-10 ENCOUNTER — Telehealth: Payer: Self-pay | Admitting: *Deleted

## 2018-06-10 DIAGNOSIS — E559 Vitamin D deficiency, unspecified: Secondary | ICD-10-CM | POA: Diagnosis present

## 2018-06-10 DIAGNOSIS — Z8541 Personal history of malignant neoplasm of cervix uteri: Secondary | ICD-10-CM

## 2018-06-10 DIAGNOSIS — C538 Malignant neoplasm of overlapping sites of cervix uteri: Secondary | ICD-10-CM

## 2018-06-10 DIAGNOSIS — Z87891 Personal history of nicotine dependence: Secondary | ICD-10-CM | POA: Diagnosis not present

## 2018-06-10 DIAGNOSIS — Z7901 Long term (current) use of anticoagulants: Secondary | ICD-10-CM | POA: Diagnosis not present

## 2018-06-10 DIAGNOSIS — K219 Gastro-esophageal reflux disease without esophagitis: Secondary | ICD-10-CM | POA: Diagnosis present

## 2018-06-10 DIAGNOSIS — D6851 Activated protein C resistance: Secondary | ICD-10-CM | POA: Diagnosis present

## 2018-06-10 DIAGNOSIS — D519 Vitamin B12 deficiency anemia, unspecified: Secondary | ICD-10-CM | POA: Diagnosis present

## 2018-06-10 DIAGNOSIS — R110 Nausea: Secondary | ICD-10-CM | POA: Diagnosis not present

## 2018-06-10 DIAGNOSIS — Z7951 Long term (current) use of inhaled steroids: Secondary | ICD-10-CM

## 2018-06-10 DIAGNOSIS — F418 Other specified anxiety disorders: Secondary | ICD-10-CM | POA: Diagnosis present

## 2018-06-10 DIAGNOSIS — Z79891 Long term (current) use of opiate analgesic: Secondary | ICD-10-CM | POA: Diagnosis not present

## 2018-06-10 DIAGNOSIS — Z96652 Presence of left artificial knee joint: Secondary | ICD-10-CM | POA: Diagnosis present

## 2018-06-10 DIAGNOSIS — R1033 Periumbilical pain: Secondary | ICD-10-CM | POA: Diagnosis not present

## 2018-06-10 DIAGNOSIS — Z923 Personal history of irradiation: Secondary | ICD-10-CM | POA: Diagnosis not present

## 2018-06-10 DIAGNOSIS — R079 Chest pain, unspecified: Secondary | ICD-10-CM

## 2018-06-10 DIAGNOSIS — R1031 Right lower quadrant pain: Secondary | ICD-10-CM

## 2018-06-10 DIAGNOSIS — Z79899 Other long term (current) drug therapy: Secondary | ICD-10-CM | POA: Diagnosis not present

## 2018-06-10 DIAGNOSIS — Z885 Allergy status to narcotic agent status: Secondary | ICD-10-CM

## 2018-06-10 DIAGNOSIS — A630 Anogenital (venereal) warts: Secondary | ICD-10-CM | POA: Diagnosis present

## 2018-06-10 DIAGNOSIS — K838 Other specified diseases of biliary tract: Secondary | ICD-10-CM | POA: Diagnosis present

## 2018-06-10 DIAGNOSIS — R109 Unspecified abdominal pain: Secondary | ICD-10-CM

## 2018-06-10 DIAGNOSIS — R1110 Vomiting, unspecified: Secondary | ICD-10-CM

## 2018-06-10 DIAGNOSIS — K601 Chronic anal fissure: Secondary | ICD-10-CM | POA: Diagnosis present

## 2018-06-10 DIAGNOSIS — C7800 Secondary malignant neoplasm of unspecified lung: Secondary | ICD-10-CM | POA: Diagnosis present

## 2018-06-10 DIAGNOSIS — I700 Atherosclerosis of aorta: Secondary | ICD-10-CM | POA: Diagnosis present

## 2018-06-10 DIAGNOSIS — Z8249 Family history of ischemic heart disease and other diseases of the circulatory system: Secondary | ICD-10-CM

## 2018-06-10 DIAGNOSIS — R112 Nausea with vomiting, unspecified: Secondary | ICD-10-CM | POA: Diagnosis not present

## 2018-06-10 DIAGNOSIS — Z5111 Encounter for antineoplastic chemotherapy: Secondary | ICD-10-CM | POA: Diagnosis present

## 2018-06-10 DIAGNOSIS — F419 Anxiety disorder, unspecified: Secondary | ICD-10-CM | POA: Diagnosis present

## 2018-06-10 DIAGNOSIS — R1084 Generalized abdominal pain: Secondary | ICD-10-CM | POA: Diagnosis not present

## 2018-06-10 DIAGNOSIS — Z90722 Acquired absence of ovaries, bilateral: Secondary | ICD-10-CM

## 2018-06-10 DIAGNOSIS — C7802 Secondary malignant neoplasm of left lung: Secondary | ICD-10-CM | POA: Diagnosis not present

## 2018-06-10 DIAGNOSIS — Z9221 Personal history of antineoplastic chemotherapy: Secondary | ICD-10-CM | POA: Diagnosis not present

## 2018-06-10 DIAGNOSIS — K529 Noninfective gastroenteritis and colitis, unspecified: Secondary | ICD-10-CM | POA: Diagnosis not present

## 2018-06-10 DIAGNOSIS — R7989 Other specified abnormal findings of blood chemistry: Secondary | ICD-10-CM | POA: Diagnosis not present

## 2018-06-10 DIAGNOSIS — B1920 Unspecified viral hepatitis C without hepatic coma: Secondary | ICD-10-CM | POA: Diagnosis present

## 2018-06-10 DIAGNOSIS — E871 Hypo-osmolality and hyponatremia: Secondary | ICD-10-CM | POA: Diagnosis not present

## 2018-06-10 DIAGNOSIS — Z9049 Acquired absence of other specified parts of digestive tract: Secondary | ICD-10-CM

## 2018-06-10 DIAGNOSIS — R000 Tachycardia, unspecified: Secondary | ICD-10-CM

## 2018-06-10 HISTORY — DX: Unspecified abdominal pain: R10.9

## 2018-06-10 LAB — BASIC METABOLIC PANEL
Anion gap: 9 (ref 5–15)
BUN: 15 mg/dL (ref 6–20)
CO2: 26 mmol/L (ref 22–32)
Calcium: 9.7 mg/dL (ref 8.9–10.3)
Chloride: 98 mmol/L (ref 98–111)
Creatinine, Ser: 0.88 mg/dL (ref 0.44–1.00)
GFR calc Af Amer: >60 mL/min (ref >60)
GFR calc non Af Amer: >60 mL/min (ref >60)
Glucose, Bld: 125 mg/dL — ABNORMAL HIGH (ref 70–99)
Potassium: 3.2 mmol/L — ABNORMAL LOW (ref 3.5–5.1)
Sodium: 133 mmol/L — ABNORMAL LOW (ref 135–145)

## 2018-06-10 LAB — CBC
HEMATOCRIT: 40 % (ref 35.0–47.0)
HEMOGLOBIN: 13.9 g/dL (ref 12.0–16.0)
MCH: 33.4 pg (ref 26.0–34.0)
MCHC: 34.6 g/dL (ref 32.0–36.0)
MCV: 96.6 fL (ref 80.0–100.0)
Platelets: 137 10*3/uL — ABNORMAL LOW (ref 150–440)
RBC: 4.15 MIL/uL (ref 3.80–5.20)
RDW: 12.8 % (ref 11.5–14.5)
WBC: 4.5 10*3/uL (ref 3.6–11.0)

## 2018-06-10 LAB — COMPREHENSIVE METABOLIC PANEL
ALBUMIN: 4.2 g/dL (ref 3.5–5.0)
ALK PHOS: 205 U/L — AB (ref 38–126)
ALT: 57 U/L — ABNORMAL HIGH (ref 0–44)
AST: 52 U/L — ABNORMAL HIGH (ref 15–41)
Anion gap: 12 (ref 5–15)
BUN: 14 mg/dL (ref 6–20)
CALCIUM: 9.7 mg/dL (ref 8.9–10.3)
CO2: 25 mmol/L (ref 22–32)
Chloride: 96 mmol/L — ABNORMAL LOW (ref 98–111)
Creatinine, Ser: 0.82 mg/dL (ref 0.44–1.00)
GFR calc non Af Amer: >60 mL/min (ref >60)
GLUCOSE: 96 mg/dL (ref 70–99)
Potassium: 3.4 mmol/L — ABNORMAL LOW (ref 3.5–5.1)
SODIUM: 133 mmol/L — AB (ref 135–145)
Total Bilirubin: 1 mg/dL (ref 0.3–1.2)
Total Protein: 8.1 g/dL (ref 6.5–8.1)

## 2018-06-10 LAB — C DIFFICILE QUICK SCREEN W PCR REFLEX
C DIFFICLE (CDIFF) ANTIGEN: NEGATIVE
C Diff interpretation: NOT DETECTED
C Diff toxin: NEGATIVE

## 2018-06-10 LAB — URINALYSIS, COMPLETE (UACMP) WITH MICROSCOPIC
Bilirubin Urine: NEGATIVE
GLUCOSE, UA: NEGATIVE mg/dL
KETONES UR: NEGATIVE mg/dL
NITRITE: NEGATIVE
PROTEIN: 30 mg/dL — AB
Specific Gravity, Urine: 1.025 (ref 1.005–1.030)
pH: 6 (ref 5.0–8.0)

## 2018-06-10 LAB — LIPASE, BLOOD: Lipase: 24 U/L (ref 11–51)

## 2018-06-10 LAB — TROPONIN I: Troponin I: 0.03 ng/mL (ref <0.03)

## 2018-06-10 LAB — MAGNESIUM: Magnesium: 1.5 mg/dL — ABNORMAL LOW (ref 1.7–2.4)

## 2018-06-10 MED ORDER — RIVAROXABAN 20 MG PO TABS
20.0000 mg | ORAL_TABLET | Freq: Every day | ORAL | Status: DC
  Administered 2018-06-10 – 2018-06-11 (×2): 20 mg via ORAL
  Filled 2018-06-10 (×3): qty 1

## 2018-06-10 MED ORDER — CHOLESTYRAMINE 4 G PO PACK
4.0000 g | PACK | Freq: Three times a day (TID) | ORAL | Status: DC
  Administered 2018-06-10 – 2018-06-11 (×2): 4 g via ORAL
  Filled 2018-06-10 (×3): qty 1

## 2018-06-10 MED ORDER — SODIUM CHLORIDE 0.9 % IV SOLN
INTRAVENOUS | Status: DC
  Administered 2018-06-10 – 2018-06-11 (×3): via INTRAVENOUS

## 2018-06-10 MED ORDER — SODIUM CHLORIDE 0.9 % IV SOLN
1.0000 g | Freq: Once | INTRAVENOUS | Status: AC
  Administered 2018-06-10: 1 g via INTRAVENOUS
  Filled 2018-06-10: qty 10

## 2018-06-10 MED ORDER — HEPARIN SOD (PORK) LOCK FLUSH 100 UNIT/ML IV SOLN
500.0000 [IU] | Freq: Once | INTRAVENOUS | Status: AC
  Filled 2018-06-10: qty 5

## 2018-06-10 MED ORDER — SERTRALINE HCL 50 MG PO TABS
50.0000 mg | ORAL_TABLET | Freq: Every day | ORAL | Status: DC
  Administered 2018-06-11 – 2018-06-12 (×2): 50 mg via ORAL
  Filled 2018-06-10 (×2): qty 1

## 2018-06-10 MED ORDER — IOPAMIDOL (ISOVUE-300) INJECTION 61%
100.0000 mL | Freq: Once | INTRAVENOUS | Status: AC | PRN
  Administered 2018-06-10: 100 mL via INTRAVENOUS

## 2018-06-10 MED ORDER — ONDANSETRON HCL 4 MG/2ML IJ SOLN
4.0000 mg | Freq: Four times a day (QID) | INTRAMUSCULAR | Status: DC | PRN
  Administered 2018-06-10 – 2018-06-12 (×4): 4 mg via INTRAVENOUS
  Filled 2018-06-10 (×4): qty 2

## 2018-06-10 MED ORDER — DIPHENOXYLATE-ATROPINE 2.5-0.025 MG PO TABS: 1.0000 | ORAL_TABLET | Freq: Four times a day (QID) | ORAL | Status: DC | PRN

## 2018-06-10 MED ORDER — LORATADINE 10 MG PO TABS
10.0000 mg | ORAL_TABLET | Freq: Every day | ORAL | Status: DC
  Administered 2018-06-11 – 2018-06-12 (×2): 10 mg via ORAL
  Filled 2018-06-10 (×2): qty 1

## 2018-06-10 MED ORDER — LORAZEPAM 2 MG/ML IJ SOLN
1.0000 mg | INTRAMUSCULAR | Status: AC
  Administered 2018-06-10: 23:00:00 1 mg via INTRAVENOUS
  Filled 2018-06-10: qty 1

## 2018-06-10 MED ORDER — MORPHINE SULFATE (PF) 2 MG/ML IV SOLN
2.0000 mg | INTRAVENOUS | Status: DC | PRN
  Administered 2018-06-10 – 2018-06-12 (×6): 2 mg via INTRAVENOUS
  Filled 2018-06-10 (×6): qty 1

## 2018-06-10 MED ORDER — KETOROLAC TROMETHAMINE 10 MG PO TABS
10.0000 mg | ORAL_TABLET | Freq: Once | ORAL | Status: AC
  Administered 2018-06-10: 10 mg via ORAL
  Filled 2018-06-10: qty 1

## 2018-06-10 MED ORDER — ALBUTEROL SULFATE (2.5 MG/3ML) 0.083% IN NEBU: 2.5000 mg | INHALATION_SOLUTION | RESPIRATORY_TRACT | Status: DC | PRN

## 2018-06-10 MED ORDER — TRAMADOL HCL 50 MG PO TABS
50.0000 mg | ORAL_TABLET | Freq: Four times a day (QID) | ORAL | Status: DC | PRN
  Administered 2018-06-10 – 2018-06-11 (×3): 50 mg via ORAL
  Filled 2018-06-10 (×3): qty 1

## 2018-06-10 MED ORDER — LORAZEPAM 0.5 MG PO TABS
0.5000 mg | ORAL_TABLET | Freq: Four times a day (QID) | ORAL | Status: DC | PRN
  Administered 2018-06-11: 13:00:00 0.5 mg via ORAL
  Filled 2018-06-10: qty 1

## 2018-06-10 MED ORDER — FLUTICASONE PROPIONATE 50 MCG/ACT NA SUSP
2.0000 | Freq: Every day | NASAL | Status: DC | PRN
  Filled 2018-06-10: qty 16

## 2018-06-10 MED ORDER — ONDANSETRON HCL 4 MG PO TABS: 4.0000 mg | ORAL_TABLET | Freq: Four times a day (QID) | ORAL | Status: DC | PRN

## 2018-06-10 MED ORDER — ACETAMINOPHEN 650 MG RE SUPP: 650.0000 mg | Freq: Four times a day (QID) | RECTAL | Status: DC | PRN

## 2018-06-10 MED ORDER — DIPHENOXYLATE-ATROPINE 2.5-0.025 MG PO TABS
1.0000 | ORAL_TABLET | Freq: Four times a day (QID) | ORAL | Status: DC
  Administered 2018-06-10 – 2018-06-11 (×4): 1 via ORAL
  Filled 2018-06-10 (×4): qty 1

## 2018-06-10 MED ORDER — SODIUM CHLORIDE 0.9% FLUSH
10.0000 mL | INTRAVENOUS | Status: DC | PRN
  Administered 2018-06-10: 10 mL via INTRAVENOUS
  Filled 2018-06-10: qty 10

## 2018-06-10 MED ORDER — ACETAMINOPHEN 325 MG PO TABS: 650.0000 mg | ORAL_TABLET | Freq: Four times a day (QID) | ORAL | Status: DC | PRN

## 2018-06-10 MED ORDER — CALCIUM CARBONATE-VITAMIN D 500-200 MG-UNIT PO TABS
2.0000 | ORAL_TABLET | Freq: Every day | ORAL | Status: DC
  Administered 2018-06-11 – 2018-06-12 (×2): 2 via ORAL
  Filled 2018-06-10 (×2): qty 2

## 2018-06-10 MED ORDER — DICYCLOMINE HCL 20 MG PO TABS
20.0000 mg | ORAL_TABLET | Freq: Four times a day (QID) | ORAL | Status: DC
  Administered 2018-06-10 – 2018-06-12 (×7): 20 mg via ORAL
  Filled 2018-06-10 (×9): qty 1

## 2018-06-10 NOTE — ED Provider Notes (Signed)
[facility] Emergency Department Provider Note  ____________________________________________   I have reviewed the triage vital signs and the nursing notes.   HISTORY  Chief Complaint Emesis; Diarrhea; and Weakness   History limited by: Not Limited   HPI Joanna Hall is a 52 y.o. female who presents to the emergency department today because of concerns for nausea vomiting and abdominal pain.  Patient states symptoms started 2 days ago.  She states that last week she underwent her first round of chemotherapy.  She states that diarrhea has been numerous and frequent.  It is watery like.  She has not noticed any blood in that are in the vomit.  She is having abdominal discomfort worse in her right lower quadrant.  She states she has a history of C. difficile and was recently put on antibiotics for colitis.  Patient denies any fevers.  No shortness of breath.   Per medical record review patient has a history of c dif  Past Medical History:  Diagnosis Date  . Abnormal cervical Papanicolaou smear 09/18/2017  . Anxiety   . Aortic atherosclerosis ([place])   . Arthritis    neck and knees  . 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 Blood transfusion without reported diagnosis   . Cervical cancer (HCC)    mets lung  . Chronic anal fissure   . Chronic diarrhea   . Dyspnea   . Factor V Leiden mutation ([place])   . Fecal incontinence   . Genital warts   . GERD (gastroesophageal reflux disease)   . Heart murmur   . Hemorrhoids   . 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 History of cancer chemotherapy    completed 06/2017  . History of Clostridium difficile infection    while undergoing chemo.  Negative test 10/2017  . 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 Intestinal infection due to Clostridium difficile 09/18/2017  . Macrocytic anemia with vitamin B12 deficiency   . Perianal condylomata   . Pneumonia     History of  . Pulmonary nodules   . Rectal bleeding   . Small bowel obstruction ([place]) 08/2017  . Stiff neck    limited right turn  . Vitamin D deficiency     Patient Active Problem List   Diagnosis Date Noted  . Encounter for antineoplastic chemotherapy 06/04/2018  . 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 Lung metastasis ([place]) 05/15/2018  . S/P TKR (total knee replacement) using cement, left 04/24/2018  . Osteoarthritis of left knee 02/28/2018  . Condyloma acuminatum of anus s/p ablation 02/22/2018 02/22/2018  . Condyloma acuminatum of vagina s/p ablation 02/22/2018 02/22/2018  . Positive ANA (antinuclear antibody) 02/04/2018  . Aortic atherosclerosis ([place]) 01/15/2018  . Elevated MCV 01/15/2018  . Anemia 01/15/2018  . Genital warts 01/15/2018  . Vitamin D deficiency 01/15/2018  . Pernicious anemia   . Impingement syndrome of shoulder region 12/19/2017  . Multiple joint pain 12/19/2017  . Osteoarthritis of right knee 12/19/2017  . B12 deficiency 12/11/2017  . Lung nodules 12/11/2017  . History of Clostridium difficile infection 10/23/2017  . Factor V Leiden ([place]) 10/19/2017  . Goals of care, counseling/discussion 10/19/2017  . 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 Chronic hepatitis C without hepatic coma ([place]) 09/18/2017  . Cytopenia 09/18/2017  . Diarrhea 09/18/2017  . Erosive gastropathy 09/18/2017  . Lower abdominal  pain 09/18/2017  . 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 Malignant neoplasm of overlapping sites of cervix ([place]) 09/18/2017  . Renal insufficiency 09/18/2017  . Wound infection after surgery 09/12/2017  . Hypokalemia   . Hypomagnesemia   . Ileocolic anastomotic leak   . Cervical arthritis 07/18/2017  . Dysuria 06/20/2017  . Metastatic cancer ([place]) 05/12/2017  . Hepatitis, chronic ([place]) 05/05/2017  . Essential hypertension 03/15/2017  . Anemia in other chronic diseases classified elsewhere 03/01/2017  . Chemotherapy-induced neutropenia  ([place]) 01/29/2017  . 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 CHOLECYSTECTOMY    . COLON SURGERY  08/2017   resection  . COLONOSCOPY WITH PROPOFOL N/A 12/20/2017   Procedure: COLONOSCOPY WITH PROPOFOL;  Surgeon: [name], MD;  Location: [facility] ENDOSCOPY;  Service: Gastroenterology;  Laterality: N/A;  . DIAGNOSTIC LAPAROSCOPY    . ESOPHAGOGASTRODUODENOSCOPY (EGD) WITH PROPOFOL N/A 12/20/2017   Procedure: ESOPHAGOGASTRODUODENOSCOPY (EGD) WITH PROPOFOL;  Surgeon: [name], MD;  Location: [place];  Service: Gastroenterology;  Laterality: N/A;  . LAPAROTOMY N/A 08/31/2017   Procedure: EXPLORATORY LAPAROTOMY for SBO, ileocolectomy, removal of piece of uterine wall;  Surgeon: [name], MD;  Location: [facility] ORS;  Service: General;  Laterality: N/A;  . LASER ABLATION CONDOLAMATA N/A 02/22/2018   Procedure: LASER ABLATION/REMOVAL OF CVELFYBOFBP AROUND ANUS AND VAGINA;  Surgeon: Michael Boston, MD;  Location: [place];  Service: General;  Laterality: N/A;  . OOPHORECTOMY    . PORTA CATH INSERTION N/A 05/13/2018   Procedure: PORTA CATH INSERTION;  Surgeon: Katha Cabal, MD;  Location: [place] CV LAB;  Service: Cardiovascular;  Laterality: N/A;  . SMALL INTESTINE SURGERY    . TANDEM RING INSERTION     x3  . THORACOTOMY    . TOTAL KNEE ARTHROPLASTY Left 04/24/2018   Procedure: TOTAL KNEE ARTHROPLASTY;  Surgeon: Lovell Sheehan, MD;  Location: [facility] ORS;  Service: Orthopedics;  Laterality: Left;    Prior to Admission medications   Medication Sig Start Date End Date Taking? Authorizing Provider  cholestyramine (QUESTRAN) 4 g packet Take 1 packet (4 g total) by mouth 3 (three) times daily. Patient taking differently: Take 4 g by mouth 2 (two) times daily.  02/11/18 06/04/18  [name], MD  cyanocobalamin (,VITAMIN B-12,) 1000 MCG/ML injection Inject 1ML every week x 4 weeks,  then 1ML every other week x 2 months, then monthly x 3 months Patient taking differently: Inject 1,000 mcg into the muscle every 30 (thirty) days. Inject 1ML every week x 4 weeks, then 1ML every other week x 2 months, then monthly x 3 months 11/29/17   [name], MD  dicyclomine (BENTYL) 20 MG tablet Take 1 tablet (20 mg total) by mouth every 6 (six) hours. 05/23/18   Jacquelin Hawking, NP  diphenoxylate-atropine (LOMOTIL) 2.5-0.025 MG tablet Take 1 tablet by mouth every 6 (six) hours as needed for up to 14 days for diarrhea or loose stools. 05/30/18 06/13/18  Verlon Au, NP  fluticasone (FLONASE) 50 MCG/ACT nasal spray Place 2 sprays into both nostrils daily. 02/11/18   Steele Sizer, MD  lidocaine-prilocaine (EMLA) cream Apply 1 application topically as needed. Apply small amount to port site at least 1 hour prior to it being accessed, cover with plastic wrap 05/20/18   Karen Kitchens, NP  loratadine (CLARITIN) 10 MG tablet Take 10 mg by mouth daily.    [provider]  LORazepam (  ATIVAN) 0.5 MG tablet Take 1 tablet (0.5 mg total) by mouth every 6 (six) hours as needed (nausea). 06/04/18   Lequita Asal, MD  Magnesium Bisglycinate (MAG GLYCINATE) 100 MG TABS Take 100 mg by mouth daily. 06/04/18   Karen Kitchens, NP  magnesium oxide (MAG-OX) 400 MG tablet Take 1 tablet (400 mg total) by mouth daily. Patient not taking: Reported on 05/31/2018 05/28/18   Karen Kitchens, NP  Multiple Vitamin (MULTIVITAMIN WITH MINERALS) TABS tablet Take 1 tablet daily by mouth.    [provider]  ondansetron (ZOFRAN) 8 MG tablet Take 1 tablet (8 mg total) by mouth every 8 (eight) hours as needed for nausea or vomiting. 05/24/18   Jacquelin Hawking, NP  oxyCODONE 10 MG TABS Take 1 tablet (10 mg total) by mouth every 4 (four) hours as needed for severe pain (pain score 7-10). 04/27/18   Lovell Sheehan, MD  potassium chloride SA (K-DUR,KLOR-CON) 20 MEQ tablet Take 1 tablet (20 mEq total) by mouth  daily for 3 days. 05/28/18 05/31/18  Karen Kitchens, NP  rivaroxaban (XARELTO) 20 MG TABS tablet Take 1 tablet (20 mg total) by mouth daily with supper. 03/13/18   Lequita Asal, MD  sertraline (ZOLOFT) 50 MG tablet Take 1 tablet (50 mg total) by mouth daily. 04/02/18   Lada, Satira Anis, MD  Syringe/Needle, Disp, (SYRINGE 3CC/22GX1") 22G X 1" 3 ML MISC For use with Vitamin B12 injections Patient not taking: Reported on 06/04/2018 04/12/18   [name], MD  traMADol (ULTRAM) 50 MG tablet Take 1 tablet (50 mg total) by mouth every 6 (six) hours as needed. 06/07/18   Karen Kitchens, NP  VENTOLIN HFA 108 (90 Base) MCG/ACT inhaler INHALE 1-2 PUFFS BY MOUTH EVERY 4 HOURS AS NEEDED FOR WHEEZING / SHORTNESS OF BREATH 08/02/17   [provider]    Allergies Ketamine  Family History  Problem Relation Age of Onset  . Hypertension Father   . Diabetes Father   . Alcohol abuse Daughter   . Hypertension Maternal Grandmother   . Diabetes Maternal Grandmother   . Diabetes Paternal Grandmother   . Hypertension Paternal Grandmother     Social History Social History   Tobacco Use  . Smoking status: Former Smoker    Last attempt to quit: 10/16/2006    Years since quitting: 11.6  . Smokeless tobacco: Never Used  Substance Use Topics  . Alcohol use: Yes    Frequency: Never    Comment: seldom  . Drug use: Yes    Types: Marijuana    Review of Systems Constitutional: No fever/chills Eyes: No visual changes. ENT: No sore throat. Cardiovascular: Denies chest pain. Respiratory: Denies shortness of breath. Gastrointestinal: Positive for abdominal pain, nausea, vomiting and diarrhea. Genitourinary: Negative for dysuria. Musculoskeletal: Negative for back pain. Skin: Negative for rash. Neurological: Negative for headaches, focal weakness or numbness.  ____________________________________________   PHYSICAL EXAM:  VITAL SIGNS: ED Triage Vitals [06/10/18 0953]  Enc Vitals Group      BP (!) 127/91     Pulse Rate 95     Resp 20     Temp 97.7 F (36.5 C)     Temp Source Oral     SpO2 100 %     Weight 169 lb (76.7 kg)     Height 5' 8"  (1.727 m)     Head Circumference      Peak Flow      Pain Score 7  Constitutional: Alert and oriented.  Eyes: Conjunctivae are normal.  ENT      Head: Normocephalic and atraumatic.      Nose: No congestion/rhinnorhea.      Mouth/Throat: Mucous membranes are moist.      Neck: No stridor. Hematological/Lymphatic/Immunilogical: No cervical lymphadenopathy. Cardiovascular: Normal rate, regular rhythm.  No murmurs, rubs, or gallops.  Respiratory: Normal respiratory effort without tachypnea nor retractions. Breath sounds are clear and equal bilaterally. No wheezes/rales/rhonchi. Gastrointestinal: Soft and tender to palpation, primarily on the right side, lower greater than upper.  Genitourinary: Deferred Musculoskeletal: Normal range of motion in all extremities. No lower extremity edema. Neurologic:  Normal speech and language. No gross focal neurologic deficits are appreciated.  Skin:  Skin is warm, dry and intact. No rash noted. Psychiatric: Mood and affect are normal. Speech and behavior are normal. Patient exhibits appropriate insight and judgment.  ____________________________________________    LABS (pertinent positives/negatives)  Lipase 24 CMP na 133, k 3.4, cr 0.82 CBC wbc 4.5, hgb 13.9, plt 137 UA hazy, trace leukocytes, >50 WBC  ____________________________________________   EKG  I, Nance Pear, attending physician, personally viewed and interpreted this EKG  EKG Time: 0956 Rate: 93 Rhythm: normal sinus rhythm Axis: normal Intervals: qtc 437 QRS: narrow ST changes: no st elevation Impression: normal ekg   ____________________________________________    RADIOLOGY  CT abd/pel Colitis, ductal dilatation in the liver  US More ductal dilatation than would be expected post  cholecystectomy  ____________________________________________   PROCEDURES  Procedures  ____________________________________________   INITIAL IMPRESSION / ASSESSMENT AND PLAN / ED COURSE  Pertinent labs & imaging results that were available during my care of the patient were reviewed by me and considered in my medical decision making (see chart for details).   Patient presented to the emergency department today with concerns for nausea vomiting diarrhea and right lower quadrant pain.  On exam she does have some tenderness in the right lower quadrant.  CT abdomen pelvis was obtained given concern for possible appendicitis.  This did show some colitis.  It also showed some ductal dilatation in the liver.  Ultrasound did confirm this and it was greater than expected status post cholecystectomy. Given that lfts are elevated will plan on admission.  ____________________________________________   FINAL CLINICAL IMPRESSION(S) / ED DIAGNOSES  Final diagnoses:  Vomiting  Right lower quadrant pain     Note: This dictation was prepared with Dragon dictation. Any transcriptional errors that result from this process are unintentional     Nance Pear, MD 06/10/18 1616

## 2018-06-10 NOTE — ED Triage Notes (Signed)
Pt reports completed first round of chemo on Tuesday for metastatic carcinoma and has had some abdominal pain, vomiting and diarrhea since then.

## 2018-06-10 NOTE — ED Notes (Signed)
Patient transported to CT 

## 2018-06-10 NOTE — Telephone Encounter (Signed)
Lab called report of Troponin of 0.03

## 2018-06-10 NOTE — ED Notes (Signed)
Beverage provided to pt.

## 2018-06-10 NOTE — H&P (Addendum)
[facility] NAME: Joanna Hall    MR#:  [id number]  DATE OF BIRTH:  09/05/1966  DATE OF ADMISSION:  06/10/2018  PRIMARY CARE PHYSICIAN: Arnetha Courser, MD   REQUESTING/REFERRING PHYSICIAN: Nance Pear, MD  CHIEF COMPLAINT:   Chief Complaint  Patient presents with  . Emesis  . Diarrhea  . Weakness    HISTORY OF PRESENT ILLNESS: Joanna Hall  is a 52 y.o. 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  Patient states that she has had chronic diarrhea and is on regimen per GI.  However since Friday she started having more diarrhea.  She took chemo on Thursday.  Now she is having epigastric pain localized in the right upper quadrant.  She also has been having nausea vomiting and not able to keep anything down.  In the ER she had a CT scan of the abdomen which showed dilated common bile ducts and intrahepatic ducts which was new for her.  She denies any fevers or chills.  Patient has had colitis and has been on antibiotics recently stool for C. difficile was negative here.     PAST MEDICAL HISTORY:   Past Medical History:  Diagnosis Date  . Abnormal cervical Papanicolaou smear 09/18/2017  . Anxiety   . Aortic atherosclerosis ([place])   . Arthritis    neck and knees  . 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 Blood transfusion without reported diagnosis   . Cervical cancer (HCC)    mets lung  . Chronic anal fissure   . Chronic diarrhea   . Dyspnea   . Factor V Leiden mutation ([place])   . Fecal incontinence   . Genital warts   . GERD (gastroesophageal reflux disease)   . Heart murmur   . Hemorrhoids   . 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 History of cancer chemotherapy    completed 06/2017  . History of Clostridium difficile infection    while undergoing chemo.  Negative test 10/2017   . 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 Intestinal infection due to Clostridium difficile 09/18/2017  . Macrocytic anemia with vitamin B12 deficiency   . Perianal condylomata   . Pneumonia    History of  . Pulmonary nodules   . Rectal bleeding   . Small bowel obstruction ([place]) 08/2017  . Stiff neck    limited right turn  . Vitamin D deficiency     PAST SURGICAL HISTORY:  Past Surgical History:  Procedure Laterality Date  . CHOLECYSTECTOMY    . COLON SURGERY  08/2017   resection  . COLONOSCOPY WITH PROPOFOL N/A 12/20/2017   Procedure: COLONOSCOPY WITH PROPOFOL;  Surgeon: [name], MD;  Location: [facility] END[facility];  Service: Gastroenterology;  Laterality: N/A;  . DIAGNOSTIC LAPAROSCOPY    . ESOPHAGOGASTRODUODENOSCOPY (EGD) WITH PROPOFOL N/A 12/20/2017   Procedure: ESOPHAGOGASTRODUODENOSCOPY (EGD) WITH PROPOFOL;  Surgeon: [name], MD;  Location: [place];  Service: Gastroenterology;  Laterality: N/A;  . LAPAROTOMY N/A 08/31/2017   Procedure: EXPLORATORY LAPAROTOMY for SBO, ileocolectomy, removal of piece of uterine wall;  Surgeon: [name], MD;  Location: [facility] ORS;  Service: General;  Laterality: N/A;  . LASER ABLATION CONDOLAMATA N/A 02/22/2018   Procedure: LASER ABLATION/REMOVAL OF CONDOLAMATA AROUND ANUS  AND VAGINA;  Surgeon: Michael Boston, MD;  [facility]  Service: General;  Laterality: N/A;  . OOPHORECTOMY    . PORTA CATH INSERTION N/A 05/13/2018   Procedure: PORTA CATH INSERTION;  Surgeon: Katha Cabal, MD;  Location: [facility];  Service: Cardiovascular;  Laterality: N/A;  . SMALL INTESTINE SURGERY    . TANDEM RING INSERTION     x3  . THORACOTOMY    . TOTAL KNEE ARTHROPLASTY Left 04/24/2018   Procedure: TOTAL KNEE ARTHROPLASTY;  Surgeon: Lovell Sheehan, MD;  Location: [facility] OR[facility];  Service: Orthopedics;  Laterality: Left;    SOCIAL HISTORY:  Social History   Tobacco Use  . Smoking  status: Former Smoker    Last attempt to quit: 10/16/2006    Years since quitting: 11.6  . Smokeless tobacco: Never Used  Substance Use Topics  . Alcohol use: Yes    Frequency: Never    Comment: seldom    FAMILY HISTORY:  Family History  Problem Relation Age of Onset  . Hypertension Father   . Diabetes Father   . Alcohol abuse Daughter   . Hypertension Maternal Grandmother   . Diabetes Maternal Grandmother   . Diabetes Paternal Grandmother   . Hypertension Paternal Grandmother     DRUG ALLERGIES:  Allergies  Allergen Reactions  . Ketamine Anxiety and Other (See Comments)    Syncope episode/confusion     REVIEW OF SYSTEMS:   CONSTITUTIONAL: No fever, fatigue or weakness.  EYES: No blurred or double vision.  EARS, NOSE, AND THROAT: No tinnitus or ear pain.  RESPIRATORY: No cough, shortness of breath, wheezing or hemoptysis.  CARDIOVASCULAR: No chest pain, orthopnea, edema.  GASTROINTESTINAL: Positive nausea, positive vomiting, positive diarrhea or positive abdominal pain.  GENITOURINARY: No dysuria, hematuria.  ENDOCRINE: No polyuria, nocturia,  HEMATOLOGY: No anemia, easy bruising or bleeding SKIN: No rash or lesion. MUSCULOSKELETAL: No joint pain or arthritis.   NEUROLOGIC: No tingling, numbness, weakness.  PSYCHIATRY: No anxiety or depression.   MEDICATIONS AT HOME:  Prior to Admission medications   Medication Sig Start Date End Date Taking? Authorizing Provider  Calcium Carb-Cholecalciferol (CALCIUM 500 +D) 500-400 MG-UNIT TABS Take 2 tablets by mouth daily.   Yes [provider]  cholestyramine (QUESTRAN) 4 g packet Take 1 packet (4 g total) by mouth 3 (three) times daily. Patient taking differently: Take 4 g by mouth 2 (two) times daily.  02/11/18 06/10/18 Yes Vanga, Tally Due, MD  cyanocobalamin (,VITAMIN B-12,) 1000 MCG/ML injection Inject 1ML every week x 4 weeks, then 1ML every other week x 2 months, then monthly x 3 months Patient taking  differently: Inject 1,000 mcg into the muscle every 30 (thirty) days. Inject 1ML every week x 4 weeks, then 1ML every other week x 2 months, then monthly x 3 months 11/29/17  Yes Vanga, Tally Due, MD  dicyclomine (BENTYL) 20 MG tablet Take 1 tablet (20 mg total) by mouth every 6 (six) hours. 05/23/18  Yes Burns, Wandra Feinstein, NP  diphenoxylate-atropine (LOMOTIL) 2.5-0.025 MG tablet Take 1 tablet by mouth every 6 (six) hours as needed for up to 14 days for diarrhea or loose stools. 05/30/18 06/13/18 Yes Verlon Au, NP  fluticasone (FLONASE) 50 MCG/ACT nasal spray Place 2 sprays into both nostrils daily. 02/11/18  Yes Sowles, Drue Stager, MD  lidocaine-prilocaine (EMLA) cream Apply 1 application topically as needed. Apply small amount to port site at least 1 hour prior to it being accessed, cover with plastic  wrap 05/20/18  Yes Karen Kitchens, NP  loratadine (CLARITIN) 10 MG tablet Take 10 mg by mouth daily.   Yes [provider]  LORazepam (ATIVAN) 0.5 MG tablet Take 1 tablet (0.5 mg total) by mouth every 6 (six) hours as needed (nausea). 06/04/18  Yes Corcoran, Drue Second, MD  ondansetron (ZOFRAN) 8 MG tablet Take 1 tablet (8 mg total) by mouth every 8 (eight) hours as needed for nausea or vomiting. 05/24/18  Yes Burns, Wandra Feinstein, NP  rivaroxaban (XARELTO) 20 MG TABS tablet Take 1 tablet (20 mg total) by mouth daily with supper. 03/13/18  Yes Corcoran, Drue Second, MD  sertraline (ZOLOFT) 50 MG tablet Take 1 tablet (50 mg total) by mouth daily. 04/02/18  Yes Lada, Satira Anis, MD  traMADol (ULTRAM) 50 MG tablet Take 1 tablet (50 mg total) by mouth every 6 (six) hours as needed. 06/07/18  Yes Karen Kitchens, NP  VENTOLIN HFA 108 (90 Base) MCG/ACT inhaler Inhale 1-2 puffs into the lungs every 4 (four) hours as needed for shortness of breath.    Yes [provider]  Magnesium Bisglycinate (MAG GLYCINATE) 100 MG TABS Take 100 mg by mouth daily. Patient not taking: Reported on 06/10/2018 06/04/18   Karen Kitchens, NP  magnesium oxide (MAG-OX) 400 MG tablet Take 1 tablet (400 mg total) by mouth daily. Patient not taking: Reported on 05/31/2018 05/28/18   Karen Kitchens, NP  oxyCODONE 10 MG TABS Take 1 tablet (10 mg total) by mouth every 4 (four) hours as needed for severe pain (pain score 7-10). Patient not taking: Reported on 06/10/2018 04/27/18   Lovell Sheehan, MD  Syringe/Needle, Disp, (SYRINGE 3CC/22GX1") 22G X 1" 3 ML MISC For use with Vitamin B12 injections Patient not taking: Reported on 06/04/2018 04/12/18   [name], MD      PHYSICAL EXAMINATION:   VITAL SIGNS: Blood pressure 107/81, pulse 84, temperature 97.7 F (36.5 C), temperature source Oral, resp. rate 20, height 5' 8"  (1.727 m), weight 76.7 kg, SpO2 95 %.  GENERAL:  52 y.o.-year-old patient lying in the bed with no acute distress.  EYES: Pupils equal, round, reactive to light and accommodation. No scleral icterus. Extraocular muscles intact.  HEENT: Head atraumatic, normocephalic. Oropharynx and nasopharynx clear.  NECK:  Supple, no jugular venous distention. No thyroid enlargement, no tenderness.  LUNGS: Normal breath sounds bilaterally, no wheezing, rales,rhonchi or crepitation. No use of accessory muscles of respiration.  CARDIOVASCULAR: S1, S2 normal. No murmurs, rubs, or gallops.  ABDOMEN: Soft, right upper quadrant tenderness r, nondistended. Bowel sounds present. No organomegaly or mass.  EXTREMITIES: No pedal edema, cyanosis, or clubbing.  NEUROLOGIC: Cranial nerves II through XII are intact. Muscle strength 5/5 in all extremities. Sensation intact. Gait not checked.  PSYCHIATRIC: The patient is alert and oriented x 3.  SKIN: No obvious rash, lesion, or ulcer.   LABORATORY PANEL:   CBC Recent Labs  Lab 06/04/18 0854 06/10/18 1007  WBC 3.5* 4.5  HGB 11.5* 13.9  HCT 33.8* 40.0  PLT 174 137*  MCV 99.2 96.6  MCH 33.6 33.4  MCHC 33.9 34.6  RDW 13.1 12.8  LYMPHSABS 0.8*  --   MONOABS 0.2  --   EOSABS 0.1   --   BASOSABS 0.0  --    ------------------------------------------------------------------------------------------------------------------  Chemistries  Recent Labs  Lab 06/04/18 0854 06/06/18 0818 06/07/18 0833 06/10/18 0846 06/10/18 1007  NA 136 138  --  133* 133*  K 3.8 4.0  --  3.2* 3.4*  CL 105 108  --  98 96*  CO2 23 23  --  26 25  GLUCOSE 87 88  --  125* 96  BUN 15 11  --  15 14  CREATININE 0.68 0.71  --  0.88 0.82  CALCIUM 8.9 8.3*  --  9.7 9.7  MG 1.4* 1.7 1.7 1.5*  --   AST 32  --   --   --  52*  ALT 32  --   --   --  57*  ALKPHOS 173*  --   --   --  205*  BILITOT 0.4  --   --   --  1.0   ------------------------------------------------------------------------------------------------------------------ estimated creatinine clearance is 89.4 mL/min (by C-G formula based on SCr of 0.82 mg/dL). ------------------------------------------------------------------------------------------------------------------ No results for input(s): TSH, T4TOTAL, T3FREE, THYROIDAB in the last 72 hours.  Invalid input(s): FREET3   Coagulation profile No results for input(s): INR, PROTIME in the last 168 hours. ------------------------------------------------------------------------------------------------------------------- No results for input(s): DDIMER in the last 72 hours. -------------------------------------------------------------------------------------------------------------------  Cardiac Enzymes Recent Labs  Lab 06/10/18 1007  TROPONINI 0.03*   ------------------------------------------------------------------------------------------------------------------ Invalid input(s): POCBNP  ---------------------------------------------------------------------------------------------------------------  Urinalysis    Component Value Date/Time   COLORURINE AMBER (A) 06/10/2018 1007   APPEARANCEUR HAZY (A) 06/10/2018 1007   LABSPEC 1.025 06/10/2018 1007   PHURINE 6.0  06/10/2018 1007   GLUCOSEU NEGATIVE 06/10/2018 1007   HGBUR SMALL (A) 06/10/2018 1007   BILIRUBINUR NEGATIVE 06/10/2018 1007   KETONESUR NEGATIVE 06/10/2018 1007   PROTEINUR 30 (A) 06/10/2018 1007   NITRITE NEGATIVE 06/10/2018 1007   LEUKOCYTESUR TRACE (A) 06/10/2018 1007     RADIOLOGY: Ct Abdomen Pelvis W Contrast  Result Date: 06/10/2018 CLINICAL DATA:  Diarrhea and abdominal cramping and pain. On chemotherapy for cervical cancer. Recent diagnosis of colitis. History of RIGHT hemicolectomy, cholecystectomy, oophorectomy, hepatitis C. EXAM: CT ABDOMEN AND PELVIS WITH CONTRAST TECHNIQUE: Multidetector CT imaging of the abdomen and pelvis was performed using the standard protocol following bolus administration of intravenous contrast. CONTRAST:  17m ISOVUE-300 IOPAMIDOL (ISOVUE-300) INJECTION 61% COMPARISON:  CT abdomen and pelvis May 23, 2018. FINDINGS: LOWER CHEST: Numerous lung base pulmonary nodules, some of which are cavitated consistent with known metastatic disease. Included heart size is normal. No pericardial effusion. HEPATOBILIARY: Increased mild intrahepatic biliary dilatation. Dilated common bile duct at 14 mm increased from prior examination. Tapered distal common bile duct without CT findings of choledocholithiasis. Mild focal fatty infiltration about the falciform ligament. Similar 13 mm PANCREAS: Normal. SPLEEN: Normal. ADRENALS/URINARY TRACT: Kidneys are orthotopic, demonstrating symmetric enhancement. 2 mm RIGHT lower pole, punctate RIGHT interpolar nephrolithiasis. No hydronephrosis or solid renal masses. The unopacified ureters are normal in course and caliber. Delayed imaging through the kidneys demonstrates symmetric prompt contrast excretion within the proximal urinary collecting system. Urinary bladder is partially distended with disproportionate wall thickening. Normal adrenal glands. STOMACH/BOWEL: Similar rectosigmoid wall thickening in edema with colonic air-fluid  levels. Status post RIGHT hemicolectomy. Decompressed small-bowel. Normal appendix. VASCULAR/LYMPHATIC: Aortoiliac vessels are normal in course and caliber. Moderate intimal thickening calcific atherosclerosis. No lymphadenopathy by CT size criteria. REPRODUCTIVE: Increased free fluid at the level of cervix. OTHER: No intraperitoneal free fluid or free air. Presacral fat stranding. MUSCULOSKELETAL: Nonacute. Intra-abdominal wall scarring. Mild lumbar levoscoliosis, advanced lumbar spondylosis. Severe RIGHT L3-4 and L4-5 neural foraminal narrowing. Moderate to severe LEFT L5-S1 neural foraminal narrowing. IMPRESSION: 1. New intra and extrahepatic biliary dilatation, differential diagnosis includes stricture, occult cholelithiasis/sludge or obstructing lesion. Recommend correlation with  LFTs. Recommend MRCP; ultrasound may be of added value and more expedient. 2. Similar colitis without complication, possibly postradiation related. Status post RIGHT hemicolectomy. 3. Bladder wall thickening concerning for cystitis. Increased cervical fluid, possibly treatment related. 4. Pulmonary metastasis. Aortic Atherosclerosis (ICD10-I70.0). Electronically Signed   By: Elon Alas M.D.   On: 06/10/2018 14:52   US Abdomen Limited Ruq  Result Date: 06/10/2018 CLINICAL DATA:  Vomiting for the past 4 days. New intrahepatic and extrahepatic biliary ductal dilatation on an abdomen pelvis CT earlier today. Previous cholecystectomy. EXAM: ULTRASOUND ABDOMEN LIMITED RIGHT UPPER QUADRANT COMPARISON:  Abdomen and pelvis CT obtained earlier today. FINDINGS: Gallbladder: Surgically absent. Common bile duct: Diameter: 12.2 mm Liver: Mildly dilated intrahepatic ducts. No mass seen. Normal echotexture. Portal vein is patent on color Doppler imaging with normal direction of blood flow towards the liver. IMPRESSION: Mild intrahepatic and extrahepatic biliary ductal dilatation, status post cholecystectomy. The degree of dilatation is  greater than expected following cholecystectomy and is concerning for the possibility of a nonvisualized distal common duct stone or stricture. This could be further evaluated with ERCP or MRCP. Electronically Signed   By: Claudie Revering M.D.   On: 06/10/2018 16:05    EKG: Orders placed or performed during the hospital encounter of 06/10/18  . ED EKG  . ED EKG    IMPRESSION AND PLAN: Patient is a 52 year old white female with metastatic cervical cancer to the lung presenting with nausea vomiting or diarrhea and abdominal pain  1.  Abdominal pain with nausea vomiting and diarrhea With dilated common bile ducts We will obtain a GI consult Obtain MRCP Give IV fluids  2. Acute on  Chronic diarrhea I will place patient on scheduled Imodium and try Lomotil  3.  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  Depression anxiety continue Zoloft and Ativan  5.  Cervical cancer with mets outpatient oncology follow-up         All the records are reviewed and case discussed with ED provider. Management plans discussed with the patient, family and they are in agreement.  CODE STATUS: Code Status History    Date Active Date Inactive Code Status Order ID Comments User Context   04/24/2018 1451 04/27/2018 1700 Full Code [id number]  Lovell Sheehan, MD Inpatient   12/03/2017 1619 12/08/2017 1606 Full Code [id number]  Epifanio Lesches, MD ED   09/12/2017 0024 09/15/2017 1721 Full Code [id number]  [name], MD ED   08/27/2017 0223 09/05/2017 1355 Full Code [id number]  Herbert Pun, MD Inpatient       TOTAL TIME TAKING CARE OF THIS PATIENT55 minutes.    Dustin Flock M.D on 06/10/2018 at 4:32 PM  Between 7am to 6pm - Pager - [phone]  After 6pm go to [email or web address] - password [email or web address] [facility]  [phone]  CC: Primary care physician; Arnetha Courser, MD

## 2018-06-11 DIAGNOSIS — R1084 Generalized abdominal pain: Secondary | ICD-10-CM

## 2018-06-11 DIAGNOSIS — R1110 Vomiting, unspecified: Secondary | ICD-10-CM

## 2018-06-11 DIAGNOSIS — R112 Nausea with vomiting, unspecified: Secondary | ICD-10-CM

## 2018-06-11 LAB — HEPATIC FUNCTION PANEL
ALT: 51 U/L — AB (ref 0–44)
AST: 41 U/L (ref 15–41)
Albumin: 3.5 g/dL (ref 3.5–5.0)
Alkaline Phosphatase: 171 U/L — ABNORMAL HIGH (ref 38–126)
BILIRUBIN DIRECT: 0.1 mg/dL (ref 0.0–0.2)
BILIRUBIN INDIRECT: 0.6 mg/dL (ref 0.3–0.9)
BILIRUBIN TOTAL: 0.7 mg/dL (ref 0.3–1.2)
Total Protein: 7 g/dL (ref 6.5–8.1)

## 2018-06-11 LAB — GASTROINTESTINAL PANEL BY PCR, STOOL (REPLACES STOOL CULTURE)
ADENOVIRUS F40/41: NOT DETECTED
ASTROVIRUS: NOT DETECTED
Campylobacter species: NOT DETECTED
Cryptosporidium: NOT DETECTED
Cyclospora cayetanensis: NOT DETECTED
ENTEROAGGREGATIVE E COLI (EAEC): NOT DETECTED
ENTEROPATHOGENIC E COLI (EPEC): NOT DETECTED
ENTEROTOXIGENIC E COLI (ETEC): NOT DETECTED
Entamoeba histolytica: NOT DETECTED
GIARDIA LAMBLIA: NOT DETECTED
NOROVIRUS GI/GII: NOT DETECTED
Plesimonas shigelloides: NOT DETECTED
ROTAVIRUS A: NOT DETECTED
SALMONELLA SPECIES: NOT DETECTED
SHIGELLA/ENTEROINVASIVE E COLI (EIEC): NOT DETECTED
Sapovirus (I, II, IV, and V): NOT DETECTED
Shiga like toxin producing E coli (STEC): NOT DETECTED
Vibrio cholerae: NOT DETECTED
Vibrio species: NOT DETECTED
Yersinia enterocolitica: NOT DETECTED

## 2018-06-11 LAB — BASIC METABOLIC PANEL
Anion gap: 6 (ref 5–15)
BUN: 11 mg/dL (ref 6–20)
CO2: 27 mmol/L (ref 22–32)
Calcium: 8.9 mg/dL (ref 8.9–10.3)
Chloride: 102 mmol/L (ref 98–111)
Creatinine, Ser: 0.63 mg/dL (ref 0.44–1.00)
GFR calc Af Amer: >60 mL/min (ref >60)
GLUCOSE: 82 mg/dL (ref 70–99)
POTASSIUM: 3.9 mmol/L (ref 3.5–5.1)
Sodium: 135 mmol/L (ref 135–145)

## 2018-06-11 LAB — CBC
HCT: 33.4 % — ABNORMAL LOW (ref 35.0–47.0)
Hemoglobin: 11.8 g/dL — ABNORMAL LOW (ref 12.0–16.0)
MCH: 34.3 pg — AB (ref 26.0–34.0)
MCHC: 35.3 g/dL (ref 32.0–36.0)
MCV: 97.1 fL (ref 80.0–100.0)
PLATELETS: 114 10*3/uL — AB (ref 150–440)
RBC: 3.44 MIL/uL — AB (ref 3.80–5.20)
RDW: 12.7 % (ref 11.5–14.5)
WBC: 3.1 10*3/uL — ABNORMAL LOW (ref 3.6–11.0)

## 2018-06-11 MED ORDER — GADOBENATE DIMEGLUMINE 529 MG/ML IV SOLN
15.0000 mL | Freq: Once | INTRAVENOUS | Status: AC | PRN
  Administered 2018-06-11: 14 mL via INTRAVENOUS

## 2018-06-11 MED ORDER — ADULT MULTIVITAMIN W/MINERALS CH
1.0000 | ORAL_TABLET | Freq: Every day | ORAL | Status: DC
  Administered 2018-06-12: 08:00:00 1 via ORAL
  Filled 2018-06-11: qty 1

## 2018-06-11 MED ORDER — LORAZEPAM 0.5 MG PO TABS: 0.5000 mg | ORAL_TABLET | Freq: Four times a day (QID) | ORAL | Status: DC | PRN

## 2018-06-11 MED ORDER — POLYETHYLENE GLYCOL 3350 17 GM/SCOOP PO POWD
1.0000 | Freq: Once | ORAL | Status: DC
  Filled 2018-06-11: qty 255

## 2018-06-11 MED ORDER — DIPHENOXYLATE-ATROPINE 2.5-0.025 MG PO TABS: 1.0000 | ORAL_TABLET | Freq: Four times a day (QID) | ORAL | Status: DC

## 2018-06-11 MED ORDER — BOOST / RESOURCE BREEZE PO LIQD CUSTOM
1.0000 | Freq: Three times a day (TID) | ORAL | Status: DC
  Administered 2018-06-11 (×2): 1 via ORAL

## 2018-06-11 MED ORDER — COLESTIPOL HCL 1 G PO TABS
2.0000 g | ORAL_TABLET | Freq: Two times a day (BID) | ORAL | Status: DC
  Administered 2018-06-11 – 2018-06-12 (×2): 2 g via ORAL
  Filled 2018-06-11 (×3): qty 2

## 2018-06-11 MED ORDER — DIPHENOXYLATE-ATROPINE 2.5-0.025 MG PO TABS
1.0000 | ORAL_TABLET | Freq: Four times a day (QID) | ORAL | Status: DC
  Administered 2018-06-11 – 2018-06-12 (×3): 1 via ORAL
  Filled 2018-06-11 (×3): qty 1

## 2018-06-11 NOTE — Plan of Care (Signed)
  Problem: Education: Goal: Knowledge of General Education information will improve Description Including pain rating scale, medication(s)/side effects and non-pharmacologic comfort measures Outcome: Progressing   Problem: Health Behavior/Discharge Planning: Goal: Ability to manage health-related needs will improve Outcome: Progressing   Problem: Clinical Measurements: Goal: Ability to maintain clinical measurements within normal limits will improve Outcome: Progressing Goal: Will remain free from infection Outcome: Progressing Goal: Diagnostic test results will improve Outcome: Progressing Goal: Respiratory complications will improve Outcome: Progressing Goal: Cardiovascular complication will be avoided Outcome: Progressing   Problem: Nutrition: Goal: Adequate nutrition will be maintained Outcome: Progressing   Problem: Elimination: Goal: Will not experience complications related to bowel motility Outcome: Progressing Goal: Will not experience complications related to urinary retention Outcome: Progressing   Problem: Pain Managment: Goal: General experience of comfort will improve Outcome: Progressing   Problem: Safety: Goal: Ability to remain free from injury will improve Outcome: Progressing   Problem: Skin Integrity: Goal: Risk for impaired skin integrity will decrease Outcome: Progressing

## 2018-06-11 NOTE — Consult Note (Signed)
Lucilla Lame, MD [facility]  [address] [place], [place] [address] Phone: [phone] Fax : [phone]  Consultation  Referring Provider:     Dr. Darvin Neighbours Primary Care Physician:  Arnetha Courser, MD Primary Gastroenterologist:  Dr. Marius Ditch         Reason for Consultation:     Vomiting and diarrhea  Date of Admission:  06/10/2018 Date of Consultation:  06/11/2018         HPI:   Joanna Hall is a 52 y.o. 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 The patient reports that she has had nausea and vomiting since Friday.  She reports that she had chemotherapy 4 days before that.  She states that she has been having chronic diarrhea since the right hemicolectomy that it is now worse in comparison.  The patient was tried on Questran which she states helped her symptoms somewhat and now she is been taking Questran and Lomotil with very little help.  The patient had a CT scan of the abdomen that showed her to have a dilated common bile duct and she had an MRCP that did not show any stones or CBD obstruction.  The patient's bilirubin has been normal although her LFTs have been elevated.  She had a colonoscopy by DrMarland Kitchen Marius Ditch and an upper endoscopy back in March.  The patient also was found to have findings consistent with colitis which was reported to be consistent with radiation exposure. The patient has also reported some abdominal pain with her nausea vomiting and diarrhea. The MRI did not show any sign of colonic wall thickening but did show enlarged lymph nodes in the porta hepatis that is unchanged from her last imaging. The MRCP mentioned a possible stricture of the cystic duct to explain the common bile duct dilation which was not seen when I reviewed the imaging.  Past Medical History:  Diagnosis Date  . Abnormal cervical Papanicolaou smear 09/18/2017  . Anxiety     . Aortic atherosclerosis ([place])   . Arthritis    neck and knees  . 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 Blood transfusion without reported diagnosis   . Cervical cancer (HCC)    mets lung  . Chronic anal fissure   . Chronic diarrhea   . Dyspnea   . Factor V Leiden mutation ([place])   . Fecal incontinence   . Genital warts   . GERD (gastroesophageal reflux disease)   . Heart murmur   . Hemorrhoids   . 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 History of cancer chemotherapy    completed 06/2017  . History of Clostridium difficile infection    while undergoing chemo.  Negative test 10/2017  . 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 Intestinal infection due to Clostridium difficile 09/18/2017  . Macrocytic anemia with vitamin B12 deficiency   . Perianal condylomata   . Pneumonia    History of  . Pulmonary nodules   . Rectal bleeding   . Small bowel obstruction ([place]) 08/2017  . Stiff neck    limited right turn  . Vitamin D deficiency     Past Surgical History:  Procedure Laterality Date  . CHOLECYSTECTOMY    . COLON SURGERY  08/2017  resection  . COLONOSCOPY WITH PROPOFOL N/A 12/20/2017   Procedure: COLONOSCOPY WITH PROPOFOL;  Surgeon: [name], MD;  Location: [facility] ENDOSCOPY;  Service: Gastroenterology;  Laterality: N/A;  . DIAGNOSTIC LAPAROSCOPY    . ESOPHAGOGASTRODUODENOSCOPY (EGD) WITH PROPOFOL N/A 12/20/2017   Procedure: ESOPHAGOGASTRODUODENOSCOPY (EGD) WITH PROPOFOL;  Surgeon: [name], MD;  Location: [place];  Service: Gastroenterology;  Laterality: N/A;  . LAPAROTOMY N/A 08/31/2017   Procedure: EXPLORATORY LAPAROTOMY for SBO, ileocolectomy, removal of piece of uterine wall;  Surgeon: [name], MD;  Location: [facility] ORS;  Service: General;  Laterality: N/A;  . LASER ABLATION CONDOLAMATA N/A 02/22/2018   Procedure: LASER ABLATION/REMOVAL OF HERDEYCXKGY AROUND ANUS AND VAGINA;  Surgeon:  Michael Boston, MD;  Location: [place];  Service: General;  Laterality: N/A;  . OOPHORECTOMY    . PORTA CATH INSERTION N/A 05/13/2018   Procedure: PORTA CATH INSERTION;  Surgeon: Katha Cabal, MD;  Location: [facility];  Service: Cardiovascular;  Laterality: N/A;  . SMALL INTESTINE SURGERY    . TANDEM RING INSERTION     x3  . THORACOTOMY    . TOTAL KNEE ARTHROPLASTY Left 04/24/2018   Procedure: TOTAL KNEE ARTHROPLASTY;  Surgeon: Lovell Sheehan, MD;  Location: [facility] ORS;  Service: Orthopedics;  Laterality: Left;    Prior to Admission medications   Medication Sig Start Date End Date Taking? Authorizing Provider  Calcium Carb-Cholecalciferol (CALCIUM 500 +D) 500-400 MG-UNIT TABS Take 2 tablets by mouth daily.   Yes [provider]  cholestyramine (QUESTRAN) 4 g packet Take 1 packet (4 g total) by mouth 3 (three) times daily. Patient taking differently: Take 4 g by mouth 2 (two) times daily.  02/11/18 06/10/18 Yes Vanga, Tally Due, MD  cyanocobalamin (,VITAMIN B-12,) 1000 MCG/ML injection Inject 1ML every week x 4 weeks, then 1ML every other week x 2 months, then monthly x 3 months Patient taking differently: Inject 1,000 mcg into the muscle every 30 (thirty) days. Inject 1ML every week x 4 weeks, then 1ML every other week x 2 months, then monthly x 3 months 11/29/17  Yes Vanga, Tally Due, MD  dicyclomine (BENTYL) 20 MG tablet Take 1 tablet (20 mg total) by mouth every 6 (six) hours. 05/23/18  Yes Burns, Wandra Feinstein, NP  diphenoxylate-atropine (LOMOTIL) 2.5-0.025 MG tablet Take 1 tablet by mouth every 6 (six) hours as needed for up to 14 days for diarrhea or loose stools. 05/30/18 06/13/18 Yes Verlon Au, NP  fluticasone (FLONASE) 50 MCG/ACT nasal spray Place 2 sprays into both nostrils daily. 02/11/18  Yes Sowles, Drue Stager, MD  lidocaine-prilocaine (EMLA) cream Apply 1 application topically as needed. Apply small amount to port site at least 1 hour prior  to it being accessed, cover with plastic wrap 05/20/18  Yes Karen Kitchens, NP  loratadine (CLARITIN) 10 MG tablet Take 10 mg by mouth daily.   Yes [provider]  LORazepam (ATIVAN) 0.5 MG tablet Take 1 tablet (0.5 mg total) by mouth every 6 (six) hours as needed (nausea). 06/04/18  Yes Corcoran, Drue Second, MD  ondansetron (ZOFRAN) 8 MG tablet Take 1 tablet (8 mg total) by mouth every 8 (eight) hours as needed for nausea or vomiting. 05/24/18  Yes Burns, Wandra Feinstein, NP  rivaroxaban (XARELTO) 20 MG TABS tablet Take 1 tablet (20 mg total) by mouth daily with supper. 03/13/18  Yes Corcoran, Drue Second, MD  sertraline (ZOLOFT) 50 MG tablet Take 1 tablet (50 mg total) by mouth  daily. 04/02/18  Yes Lada, Satira Anis, MD  traMADol (ULTRAM) 50 MG tablet Take 1 tablet (50 mg total) by mouth every 6 (six) hours as needed. 06/07/18  Yes Karen Kitchens, NP  VENTOLIN HFA 108 (90 Base) MCG/ACT inhaler Inhale 1-2 puffs into the lungs every 4 (four) hours as needed for shortness of breath.    Yes [provider]  Magnesium Bisglycinate (MAG GLYCINATE) 100 MG TABS Take 100 mg by mouth daily. Patient not taking: Reported on 06/10/2018 06/04/18   Karen Kitchens, NP  magnesium oxide (MAG-OX) 400 MG tablet Take 1 tablet (400 mg total) by mouth daily. Patient not taking: Reported on 05/31/2018 05/28/18   Karen Kitchens, NP  oxyCODONE 10 MG TABS Take 1 tablet (10 mg total) by mouth every 4 (four) hours as needed for severe pain (pain score 7-10). Patient not taking: Reported on 06/10/2018 04/27/18   Lovell Sheehan, MD  Syringe/Needle, Disp, (SYRINGE 3CC/22GX1") 22G X 1" 3 ML MISC For use with Vitamin B12 injections Patient not taking: Reported on 06/04/2018 04/12/18   [name], MD    Family History  Problem Relation Age of Onset  . Hypertension Father   . Diabetes Father   . Alcohol abuse Daughter   . Hypertension Maternal Grandmother   . Diabetes Maternal Grandmother   . Diabetes Paternal Grandmother     . Hypertension Paternal Grandmother      Social History   Tobacco Use  . Smoking status: Former Smoker    Last attempt to quit: 10/16/2006    Years since quitting: 11.6  . Smokeless tobacco: Never Used  Substance Use Topics  . Alcohol use: Yes    Frequency: Never    Comment: seldom  . Drug use: Yes    Types: Marijuana    Allergies as of 06/10/2018 - Review Complete 06/10/2018  Allergen Reaction Noted  . Ketamine Anxiety and Other (See Comments) 02/22/2017    Review of Systems:    All systems reviewed and negative except where noted in HPI.   Physical Exam:  Vital signs in last 24 hours: Temp:  [97.5 F (36.4 C)-98.3 F (36.8 C)] 97.5 F (36.4 C) (08/27 1435) Pulse Rate:  [65-84] 65 (08/27 1435) Resp:  [16-20] 20 (08/27 1435) BP: (98-132)/(60-97) 98/60 (08/27 1435) SpO2:  [96 %-100 %] 100 % (08/27 1435) Weight:  [69.1 kg] 69.1 kg (08/26 1803) Last BM Date: 06/11/18 General:   Pleasant, cooperative in NAD Head:  Normocephalic and atraumatic. Eyes:   No icterus.   Conjunctiva pink. PERRLA. Ears:  Normal auditory acuity. Neck:  Supple; no masses or thyroidomegaly Lungs: Respirations even and unlabored. Lungs clear to auscultation bilaterally.   No wheezes, crackles, or rhonchi.  Heart:  Regular rate and rhythm;  Without murmur, clicks, rubs or gallops Abdomen:  Soft, nondistended, nontender. Normal bowel sounds. No appreciable masses or hepatomegaly.  No rebound or guarding.  Rectal:  Not performed. Msk:  Symmetrical without gross deformities.   Extremities:  Without edema, cyanosis or clubbing. Neurologic:  Alert and oriented x3;  grossly normal neurologically. Skin:  Intact without significant lesions or rashes. Cervical Nodes:  No significant cervical adenopathy. Psych:  Alert and cooperative. Normal affect.  LAB RESULTS: Recent Labs    06/10/18 1007 06/11/18 0510  WBC 4.5 3.1*  HGB 13.9 11.8*  HCT 40.0 33.4*  PLT 137* 114*   BMET Recent Labs     06/10/18 0846 06/10/18 1007 06/11/18 0510  NA 133* 133* 135  K 3.2* 3.4* 3.9  CL 98 96* 102  CO2 26 25 27   GLUCOSE 125* 96 82  BUN 15 14 11   CREATININE 0.88 0.82 0.63  CALCIUM 9.7 9.7 8.9   LFT Recent Labs    06/11/18 0510  PROT 7.0  ALBUMIN 3.5  AST 41  ALT 51*  ALKPHOS 171*  BILITOT 0.7  BILIDIR 0.1  IBILI 0.6   PT/INR No results for input(s): LABPROT, INR in the last 72 hours.  STUDIES: Ct Abdomen Pelvis W Contrast  Result Date: 06/10/2018 CLINICAL DATA:  Diarrhea and abdominal cramping and pain. On chemotherapy for cervical cancer. Recent diagnosis of colitis. History of RIGHT hemicolectomy, cholecystectomy, oophorectomy, hepatitis C. EXAM: CT ABDOMEN AND PELVIS WITH CONTRAST TECHNIQUE: Multidetector CT imaging of the abdomen and pelvis was performed using the standard protocol following bolus administration of intravenous contrast. CONTRAST:  172m ISOVUE-300 IOPAMIDOL (ISOVUE-300) INJECTION 61% COMPARISON:  CT abdomen and pelvis May 23, 2018. FINDINGS: LOWER CHEST: Numerous lung base pulmonary nodules, some of which are cavitated consistent with known metastatic disease. Included heart size is normal. No pericardial effusion. HEPATOBILIARY: Increased mild intrahepatic biliary dilatation. Dilated common bile duct at 14 mm increased from prior examination. Tapered distal common bile duct without CT findings of choledocholithiasis. Mild focal fatty infiltration about the falciform ligament. Similar 13 mm PANCREAS: Normal. SPLEEN: Normal. ADRENALS/URINARY TRACT: Kidneys are orthotopic, demonstrating symmetric enhancement. 2 mm RIGHT lower pole, punctate RIGHT interpolar nephrolithiasis. No hydronephrosis or solid renal masses. The unopacified ureters are normal in course and caliber. Delayed imaging through the kidneys demonstrates symmetric prompt contrast excretion within the proximal urinary collecting system. Urinary bladder is partially distended with disproportionate wall  thickening. Normal adrenal glands. STOMACH/BOWEL: Similar rectosigmoid wall thickening in edema with colonic air-fluid levels. Status post RIGHT hemicolectomy. Decompressed small-bowel. Normal appendix. VASCULAR/LYMPHATIC: Aortoiliac vessels are normal in course and caliber. Moderate intimal thickening calcific atherosclerosis. No lymphadenopathy by CT size criteria. REPRODUCTIVE: Increased free fluid at the level of cervix. OTHER: No intraperitoneal free fluid or free air. Presacral fat stranding. MUSCULOSKELETAL: Nonacute. Intra-abdominal wall scarring. Mild lumbar levoscoliosis, advanced lumbar spondylosis. Severe RIGHT L3-4 and L4-5 neural foraminal narrowing. Moderate to severe LEFT L5-S1 neural foraminal narrowing. IMPRESSION: 1. New intra and extrahepatic biliary dilatation, differential diagnosis includes stricture, occult cholelithiasis/sludge or obstructing lesion. Recommend correlation with LFTs. Recommend MRCP; ultrasound may be of added value and more expedient. 2. Similar colitis without complication, possibly postradiation related. Status post RIGHT hemicolectomy. 3. Bladder wall thickening concerning for cystitis. Increased cervical fluid, possibly treatment related. 4. Pulmonary metastasis. Aortic Atherosclerosis (ICD10-I70.0). Electronically Signed   By: CElon AlasM.D.   On: 06/10/2018 14:52   Mr 3d Recon At Scanner  Result Date: 06/11/2018 CLINICAL DATA:  Abdominal pain, nausea, vomiting, and diarrhea. Chemotherapy last taken on Thursday for metastatic cervical cancer. Epigastric pain in the right upper quadrant. Dilated bile ducts on recent CT. Recent colitis. EXAM: MRI ABDOMEN WITHOUT AND WITH CONTRAST (INCLUDING MRCP) TECHNIQUE: Multiplanar multisequence MR imaging of the abdomen was performed both before and after the administration of intravenous contrast. Heavily T2-weighted images of the biliary and pancreatic ducts were obtained, and three-dimensional MRCP images were rendered  by post processing. CONTRAST:  195mMULTIHANCE GADOBENATE DIMEGLUMINE 529 MG/ML IV SOLN COMPARISON:  Multiple exams, including CT scan from 06/10/2018 FINDINGS: Lower chest: Scattered pulmonary nodules in the lung bases similar to recent CT scan. As before, some of these are cavitary. Hepatobiliary: Mild intrahepatic biliary dilatation. Common bile duct 1.3 cm  in diameter, with conical distal tapering at the ampulla but without appreciable filling defect. Dilated cystic duct remnant. No abnormal enhancement along the walls of the biliary tree or in the ampulla no appreciable hepatic metastatic disease. Pancreas: Unremarkable. The dorsal pancreatic duct is not dilated. No appreciable metastatic lesion to the pancreas. Spleen:  Unremarkable Adrenals/Urinary Tract:  Unremarkable Stomach/Bowel: Air-fluid levels in the distal colon compatible with diarrheal process. No significant degree of colon wall thickening where visualized. Vascular/Lymphatic: 1.3 cm porta hepatis node on image 16/7, stable. On the same image there is a separate 1.1 cm node on image 16/7 which is likewise stable. Small periaortic lymph nodes are not pathologically enlarged. Other:  No supplemental non-categorized findings. Musculoskeletal: Lumbar scoliosis with degenerative disc disease and degenerative endplate findings as well as spondylosis. IMPRESSION: 1. Although there is stable porta hepatis adenopathy, no mass or specific lesion is identified in the vicinity of the ampulla to explain the intrahepatic and extrahepatic biliary dilatation. No filling defect in the common bile duct or abnormal enhancement along the wall of the common bile duct. Presumably there is a stricture of the cystic duct contributing to the biliary dilatation on today's imaging and recent imaging. 2. Scattered air-fluid levels in the distal colon, indicating diarrheal process. 3. Stable scattered bibasilar pulmonary nodules compatible with metastatic disease. Some of  these nodules are cavitary. 4. Notable degree of lumbar scoliosis, spondylosis, and degenerative disc disease. Electronically Signed   By: Van Clines M.D.   On: 06/11/2018 07:45   Mr Abdomen Mrcp Moise Boring Contast  Result Date: 06/11/2018 CLINICAL DATA:  Abdominal pain, nausea, vomiting, and diarrhea. Chemotherapy last taken on Thursday for metastatic cervical cancer. Epigastric pain in the right upper quadrant. Dilated bile ducts on recent CT. Recent colitis. EXAM: MRI ABDOMEN WITHOUT AND WITH CONTRAST (INCLUDING MRCP) TECHNIQUE: Multiplanar multisequence MR imaging of the abdomen was performed both before and after the administration of intravenous contrast. Heavily T2-weighted images of the biliary and pancreatic ducts were obtained, and three-dimensional MRCP images were rendered by post processing. CONTRAST:  28m MULTIHANCE GADOBENATE DIMEGLUMINE 529 MG/ML IV SOLN COMPARISON:  Multiple exams, including CT scan from 06/10/2018 FINDINGS: Lower chest: Scattered pulmonary nodules in the lung bases similar to recent CT scan. As before, some of these are cavitary. Hepatobiliary: Mild intrahepatic biliary dilatation. Common bile duct 1.3 cm in diameter, with conical distal tapering at the ampulla but without appreciable filling defect. Dilated cystic duct remnant. No abnormal enhancement along the walls of the biliary tree or in the ampulla no appreciable hepatic metastatic disease. Pancreas: Unremarkable. The dorsal pancreatic duct is not dilated. No appreciable metastatic lesion to the pancreas. Spleen:  Unremarkable Adrenals/Urinary Tract:  Unremarkable Stomach/Bowel: Air-fluid levels in the distal colon compatible with diarrheal process. No significant degree of colon wall thickening where visualized. Vascular/Lymphatic: 1.3 cm porta hepatis node on image 16/7, stable. On the same image there is a separate 1.1 cm node on image 16/7 which is likewise stable. Small periaortic lymph nodes are not  pathologically enlarged. Other:  No supplemental non-categorized findings. Musculoskeletal: Lumbar scoliosis with degenerative disc disease and degenerative endplate findings as well as spondylosis. IMPRESSION: 1. Although there is stable porta hepatis adenopathy, no mass or specific lesion is identified in the vicinity of the ampulla to explain the intrahepatic and extrahepatic biliary dilatation. No filling defect in the common bile duct or abnormal enhancement along the wall of the common bile duct. Presumably there is a stricture of the cystic duct contributing  to the biliary dilatation on today's imaging and recent imaging. 2. Scattered air-fluid levels in the distal colon, indicating diarrheal process. 3. Stable scattered bibasilar pulmonary nodules compatible with metastatic disease. Some of these nodules are cavitary. 4. Notable degree of lumbar scoliosis, spondylosis, and degenerative disc disease. Electronically Signed   By: Van Clines M.D.   On: 06/11/2018 07:45   US Abdomen Limited Ruq  Result Date: 06/10/2018 CLINICAL DATA:  Vomiting for the past 4 days. New intrahepatic and extrahepatic biliary ductal dilatation on an abdomen pelvis CT earlier today. Previous cholecystectomy. EXAM: ULTRASOUND ABDOMEN LIMITED RIGHT UPPER QUADRANT COMPARISON:  Abdomen and pelvis CT obtained earlier today. FINDINGS: Gallbladder: Surgically absent. Common bile duct: Diameter: 12.2 mm Liver: Mildly dilated intrahepatic ducts. No mass seen. Normal echotexture. Portal vein is patent on color Doppler imaging with normal direction of blood flow towards the liver. IMPRESSION: Mild intrahepatic and extrahepatic biliary ductal dilatation, status post cholecystectomy. The degree of dilatation is greater than expected following cholecystectomy and is concerning for the possibility of a nonvisualized distal common duct stone or stricture. This could be further evaluated with ERCP or MRCP. Electronically Signed   By:  Claudie Revering M.D.   On: 06/10/2018 16:05      Impression / Plan:   Assessment: Active Problems:   Abdominal pain Dilated common bile duct Diarrhea Nausea and vomiting  Joanna Hall is a 52 y.o. y/o female with a history of cervical cancer. The patient reports 4 days of nausea vomiting diarrhea.  The patient had a chemotherapy treatment for her cervical cancer 4 days before his symptoms started.  The patient has had no vomiting of blood or black or bloody stools.  The patient has had chronic diarrhea since having her right hemicolectomy but states it has been worse recently.  The patient also has diffuse abdominal pain with her symptoms.   Plan: The patient's MRCP showed a dilated common bile duct without a stone or obstruction seen.  The patient's diarrhea nausea and vomiting may be related to her chemotherapy.  The patient had an EGD and colonoscopy back in March without any abnormalities to explain the patient's symptoms at this time.  The patient will have her stool sent off for GI panel to make sure there is no infective cause for her symptoms. I spoke to the radiologist who read the report and he misspoke and the MRCP should have stated that a ampullary stricture could not be ruled out and there was no cystic duct stricture.  With the patient's normal bilirubin I am not certain that an ERCP would add much benefit with the patient having a risk of post ERCP pancreatitis.   Thank you for involving me in the care of this patient.      LOS: 1 day   Lucilla Lame, MD  06/11/2018, 4:15 PM    Note: This dictation was prepared with Dragon dictation along with smaller phrase technology. Any transcriptional errors that result from this process are unintentional.

## 2018-06-11 NOTE — Progress Notes (Addendum)
[facility] NAME: Varie Machamer    MR#:  [id number]  DATE OF BIRTH:  Feb 23, 1966  SUBJECTIVE:  CHIEF COMPLAINT:   Chief Complaint  Patient presents with  . Emesis  . Diarrhea  . Weakness   Still has diarrhea and abd pain  REVIEW OF SYSTEMS:    Review of Systems  Constitutional: Positive for malaise/fatigue. Negative for chills and fever.  HENT: Negative for sore throat.   Eyes: Negative for blurred vision, double vision and pain.  Respiratory: Negative for cough, hemoptysis, shortness of breath and wheezing.   Cardiovascular: Negative for chest pain, palpitations, orthopnea and leg swelling.  Gastrointestinal: Positive for abdominal pain, diarrhea, nausea and vomiting. Negative for constipation and heartburn.  Genitourinary: Negative for dysuria and hematuria.  Musculoskeletal: Negative for back pain and joint pain.  Skin: Negative for rash.  Neurological: Negative for sensory change, speech change, focal weakness and headaches.  Endo/Heme/Allergies: Does not bruise/bleed easily.  Psychiatric/Behavioral: Negative for depression. The patient is not nervous/anxious.     DRUG ALLERGIES:   Allergies  Allergen Reactions  . Ketamine Anxiety and Other (See Comments)    Syncope episode/confusion     VITALS:  Blood pressure 98/60, pulse 65, temperature (!) 97.5 F (36.4 C), temperature source Oral, resp. rate 20, height 5' 8"  (1.727 m), weight 69.1 kg, SpO2 100 %.  PHYSICAL EXAMINATION:   Physical Exam  GENERAL:  52 y.o.-year-old patient lying in the bed with no acute distress.  EYES: Pupils equal, round, reactive to light and accommodation. No scleral icterus. Extraocular muscles intact.  HEENT: Head atraumatic, normocephalic. Oropharynx and nasopharynx clear.  NECK:  Supple, no jugular venous distention. No thyroid enlargement, no tenderness.  LUNGS: Normal breath sounds bilaterally, no wheezing, rales, rhonchi. No  use of accessory muscles of respiration.  CARDIOVASCULAR: S1, S2 normal. No murmurs, rubs, or gallops.  ABDOMEN: Soft, nontender, nondistended. Bowel sounds present. No organomegaly or mass.  EXTREMITIES: No cyanosis, clubbing or edema b/l.    NEUROLOGIC: Cranial nerves II through XII are intact. No focal Motor or sensory deficits b/l.   PSYCHIATRIC: The patient is alert and oriented x 3.  SKIN: No obvious rash, lesion, or ulcer.   LABORATORY PANEL:   CBC Recent Labs  Lab 06/11/18 0510  WBC 3.1*  HGB 11.8*  HCT 33.4*  PLT 114*   ------------------------------------------------------------------------------------------------------------------ Chemistries  Recent Labs  Lab 06/10/18 0846  06/11/18 0510  NA 133*   < > 135  K 3.2*   < > 3.9  CL 98   < > 102  CO2 26   < > 27  GLUCOSE 125*   < > 82  BUN 15   < > 11  CREATININE 0.88   < > 0.63  CALCIUM 9.7   < > 8.9  MG 1.5*  --   --   AST  --    < > 41  ALT  --    < > 51*  ALKPHOS  --    < > 171*  BILITOT  --    < > 0.7   < > = values in this interval not displayed.   ------------------------------------------------------------------------------------------------------------------  Cardiac Enzymes Recent Labs  Lab 06/10/18 1007  TROPONINI 0.03*   ------------------------------------------------------------------------------------------------------------------  RADIOLOGY:  Ct Abdomen Pelvis W Contrast  Result Date: 06/10/2018 CLINICAL DATA:  Diarrhea and abdominal cramping and pain. On chemotherapy for cervical cancer. Recent diagnosis of colitis. History of RIGHT hemicolectomy, cholecystectomy, oophorectomy,  hepatitis C. EXAM: CT ABDOMEN AND PELVIS WITH CONTRAST TECHNIQUE: Multidetector CT imaging of the abdomen and pelvis was performed using the standard protocol following bolus administration of intravenous contrast. CONTRAST:  12m ISOVUE-300 IOPAMIDOL (ISOVUE-300) INJECTION 61% COMPARISON:  CT abdomen and pelvis  May 23, 2018. FINDINGS: LOWER CHEST: Numerous lung base pulmonary nodules, some of which are cavitated consistent with known metastatic disease. Included heart size is normal. No pericardial effusion. HEPATOBILIARY: Increased mild intrahepatic biliary dilatation. Dilated common bile duct at 14 mm increased from prior examination. Tapered distal common bile duct without CT findings of choledocholithiasis. Mild focal fatty infiltration about the falciform ligament. Similar 13 mm PANCREAS: Normal. SPLEEN: Normal. ADRENALS/URINARY TRACT: Kidneys are orthotopic, demonstrating symmetric enhancement. 2 mm RIGHT lower pole, punctate RIGHT interpolar nephrolithiasis. No hydronephrosis or solid renal masses. The unopacified ureters are normal in course and caliber. Delayed imaging through the kidneys demonstrates symmetric prompt contrast excretion within the proximal urinary collecting system. Urinary bladder is partially distended with disproportionate wall thickening. Normal adrenal glands. STOMACH/BOWEL: Similar rectosigmoid wall thickening in edema with colonic air-fluid levels. Status post RIGHT hemicolectomy. Decompressed small-bowel. Normal appendix. VASCULAR/LYMPHATIC: Aortoiliac vessels are normal in course and caliber. Moderate intimal thickening calcific atherosclerosis. No lymphadenopathy by CT size criteria. REPRODUCTIVE: Increased free fluid at the level of cervix. OTHER: No intraperitoneal free fluid or free air. Presacral fat stranding. MUSCULOSKELETAL: Nonacute. Intra-abdominal wall scarring. Mild lumbar levoscoliosis, advanced lumbar spondylosis. Severe RIGHT L3-4 and L4-5 neural foraminal narrowing. Moderate to severe LEFT L5-S1 neural foraminal narrowing. IMPRESSION: 1. New intra and extrahepatic biliary dilatation, differential diagnosis includes stricture, occult cholelithiasis/sludge or obstructing lesion. Recommend correlation with LFTs. Recommend MRCP; ultrasound may be of added value and more  expedient. 2. Similar colitis without complication, possibly postradiation related. Status post RIGHT hemicolectomy. 3. Bladder wall thickening concerning for cystitis. Increased cervical fluid, possibly treatment related. 4. Pulmonary metastasis. Aortic Atherosclerosis (ICD10-I70.0). Electronically Signed   By: CElon AlasM.D.   On: 06/10/2018 14:52   Mr 3d Recon At Scanner  Result Date: 06/11/2018 CLINICAL DATA:  Abdominal pain, nausea, vomiting, and diarrhea. Chemotherapy last taken on Thursday for metastatic cervical cancer. Epigastric pain in the right upper quadrant. Dilated bile ducts on recent CT. Recent colitis. EXAM: MRI ABDOMEN WITHOUT AND WITH CONTRAST (INCLUDING MRCP) TECHNIQUE: Multiplanar multisequence MR imaging of the abdomen was performed both before and after the administration of intravenous contrast. Heavily T2-weighted images of the biliary and pancreatic ducts were obtained, and three-dimensional MRCP images were rendered by post processing. CONTRAST:  129mMULTIHANCE GADOBENATE DIMEGLUMINE 529 MG/ML IV SOLN COMPARISON:  Multiple exams, including CT scan from 06/10/2018 FINDINGS: Lower chest: Scattered pulmonary nodules in the lung bases similar to recent CT scan. As before, some of these are cavitary. Hepatobiliary: Mild intrahepatic biliary dilatation. Common bile duct 1.3 cm in diameter, with conical distal tapering at the ampulla but without appreciable filling defect. Dilated cystic duct remnant. No abnormal enhancement along the walls of the biliary tree or in the ampulla no appreciable hepatic metastatic disease. Pancreas: Unremarkable. The dorsal pancreatic duct is not dilated. No appreciable metastatic lesion to the pancreas. Spleen:  Unremarkable Adrenals/Urinary Tract:  Unremarkable Stomach/Bowel: Air-fluid levels in the distal colon compatible with diarrheal process. No significant degree of colon wall thickening where visualized. Vascular/Lymphatic: 1.3 cm porta hepatis  node on image 16/7, stable. On the same image there is a separate 1.1 cm node on image 16/7 which is likewise stable. Small periaortic lymph nodes are not pathologically enlarged.  Other:  No supplemental non-categorized findings. Musculoskeletal: Lumbar scoliosis with degenerative disc disease and degenerative endplate findings as well as spondylosis. IMPRESSION: 1. Although there is stable porta hepatis adenopathy, no mass or specific lesion is identified in the vicinity of the ampulla to explain the intrahepatic and extrahepatic biliary dilatation. No filling defect in the common bile duct or abnormal enhancement along the wall of the common bile duct. Presumably there is a stricture of the cystic duct contributing to the biliary dilatation on today's imaging and recent imaging. 2. Scattered air-fluid levels in the distal colon, indicating diarrheal process. 3. Stable scattered bibasilar pulmonary nodules compatible with metastatic disease. Some of these nodules are cavitary. 4. Notable degree of lumbar scoliosis, spondylosis, and degenerative disc disease. Electronically Signed   By: Van Clines M.D.   On: 06/11/2018 07:45   Mr Abdomen Mrcp Moise Boring Contast  Result Date: 06/11/2018 CLINICAL DATA:  Abdominal pain, nausea, vomiting, and diarrhea. Chemotherapy last taken on Thursday for metastatic cervical cancer. Epigastric pain in the right upper quadrant. Dilated bile ducts on recent CT. Recent colitis. EXAM: MRI ABDOMEN WITHOUT AND WITH CONTRAST (INCLUDING MRCP) TECHNIQUE: Multiplanar multisequence MR imaging of the abdomen was performed both before and after the administration of intravenous contrast. Heavily T2-weighted images of the biliary and pancreatic ducts were obtained, and three-dimensional MRCP images were rendered by post processing. CONTRAST:  17m MULTIHANCE GADOBENATE DIMEGLUMINE 529 MG/ML IV SOLN COMPARISON:  Multiple exams, including CT scan from 06/10/2018 FINDINGS: Lower chest:  Scattered pulmonary nodules in the lung bases similar to recent CT scan. As before, some of these are cavitary. Hepatobiliary: Mild intrahepatic biliary dilatation. Common bile duct 1.3 cm in diameter, with conical distal tapering at the ampulla but without appreciable filling defect. Dilated cystic duct remnant. No abnormal enhancement along the walls of the biliary tree or in the ampulla no appreciable hepatic metastatic disease. Pancreas: Unremarkable. The dorsal pancreatic duct is not dilated. No appreciable metastatic lesion to the pancreas. Spleen:  Unremarkable Adrenals/Urinary Tract:  Unremarkable Stomach/Bowel: Air-fluid levels in the distal colon compatible with diarrheal process. No significant degree of colon wall thickening where visualized. Vascular/Lymphatic: 1.3 cm porta hepatis node on image 16/7, stable. On the same image there is a separate 1.1 cm node on image 16/7 which is likewise stable. Small periaortic lymph nodes are not pathologically enlarged. Other:  No supplemental non-categorized findings. Musculoskeletal: Lumbar scoliosis with degenerative disc disease and degenerative endplate findings as well as spondylosis. IMPRESSION: 1. Although there is stable porta hepatis adenopathy, no mass or specific lesion is identified in the vicinity of the ampulla to explain the intrahepatic and extrahepatic biliary dilatation. No filling defect in the common bile duct or abnormal enhancement along the wall of the common bile duct. Presumably there is a stricture of the cystic duct contributing to the biliary dilatation on today's imaging and recent imaging. 2. Scattered air-fluid levels in the distal colon, indicating diarrheal process. 3. Stable scattered bibasilar pulmonary nodules compatible with metastatic disease. Some of these nodules are cavitary. 4. Notable degree of lumbar scoliosis, spondylosis, and degenerative disc disease. Electronically Signed   By: WVan ClinesM.D.   On:  06/11/2018 07:45   UKoreaAbdomen Limited Ruq  Result Date: 06/10/2018 CLINICAL DATA:  Vomiting for the past 4 days. New intrahepatic and extrahepatic biliary ductal dilatation on an abdomen pelvis CT earlier today. Previous cholecystectomy. EXAM: ULTRASOUND ABDOMEN LIMITED RIGHT UPPER QUADRANT COMPARISON:  Abdomen and pelvis CT obtained earlier today. FINDINGS: Gallbladder: Surgically  absent. Common bile duct: Diameter: 12.2 mm Liver: Mildly dilated intrahepatic ducts. No mass seen. Normal echotexture. Portal vein is patent on color Doppler imaging with normal direction of blood flow towards the liver. IMPRESSION: Mild intrahepatic and extrahepatic biliary ductal dilatation, status post cholecystectomy. The degree of dilatation is greater than expected following cholecystectomy and is concerning for the possibility of a nonvisualized distal common duct stone or stricture. This could be further evaluated with ERCP or MRCP. Electronically Signed   By: Claudie Revering M.D.   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  Abdominal pain with nausea vomiting and diarrhea Combination of chronic and new features. C. difficile negative. MRCP with possible CBD stricture. GI consulted and waiting for input  2. Acute on  Chronic diarrhea Imodium and Lomotil  3.  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  Depression anxiety continue Zoloft and Ativan  5.  Cervical cancer with mets outpatient oncology follow-up  All the records are reviewed and case discussed with Care Management/Social Worker. Management plans discussed with the patient, family and they are in agreement.  CODE STATUS: FULL CODE  DVT Prophylaxis: SCDs  TOTAL TIME TAKING CARE OF THIS PATIENT: 35 minutes.   POSSIBLE D/C IN 1-2 DAYS, DEPENDING ON CLINICAL  CONDITION.  Leia Alf Gricel Copen M.D on 06/11/2018 at 3:27 PM  Between 7am to 6pm - Pager - [phone]  After 6pm go to [email or web address] - password [email or web address] [facility]  Office  [phone]  CC: Primary care physician; Arnetha Courser, MD  Note: This dictation was prepared with Dragon dictation along with smaller phrase technology. Any transcriptional errors that result from this process are unintentional.

## 2018-06-11 NOTE — Progress Notes (Signed)
Initial Nutrition Assessment  DOCUMENTATION CODES:   Not applicable  INTERVENTION:   Boost Breeze po TID, each supplement provides 250 kcal and 9 grams of protein  MVI daily  Magic cup TID with meals, each supplement provides 290 kcal and 9 grams of protein  NUTRITION DIAGNOSIS:   Increased nutrient needs related to cancer and cancer related treatments as evidenced by increased estimated needs.  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 Patient familiar to nutrition department from multiple previous admits. Pt reports poor appetite and oral intake since Friday when she started having severe diarrhea, nausea and vomiting. Pt reports that she has been unable to keep any food down. Pt ate some ice cream and pudding for lunch today and then vomited afterwards. Pt does not drink any supplements at home but is willing to drink them here. Pt would like to start with Boost Breeze as she feels she can keep this down better. Per chart, pt has been fairly weight stable pta. Pt reports that she got down to 149lbs at one point but had gained back up to 155lbs and has been staying between 155-160lbs. Pt 152lbs at admit; suspect weight is down r/t dehydration. Pt with h/o ileocecectomy and bile salt diarrhea since 08/2017. Pt started cholestyramine in February and reports that this has helped tremendously. Pt initially was taking 3 (4g) packs per day but has decreased dose down to only one pack per day. Pt reports that recently, she has only been having  diarrhea once per day up until Friday. Pt with h/o B12 and vitamin D deficiency; takes 50,000 units of vitamin D weekly and gets monthly B12 injections. RD will order supplements and MVI to help pt meet her estimated needs. Recommend check and Mg and P labs as pt with chronic diarrhea.     Medications reviewed and include: oscal with D, lomotil, NaCl @75ml /hr, zofran  Labs reviewed: B12- 319- 4/29 Wbc- 3.1(L), Hgb 11.8(L), Hct 33.4(L)  NUTRITION - FOCUSED PHYSICAL EXAM:    Most Recent Value  Orbital Region  No depletion  Upper Arm Region  Mild depletion  Thoracic and Lumbar Region  Mild depletion  Buccal Region  Mild depletion  Temple Region  No depletion  Clavicle Bone Region  Mild depletion  Clavicle and Acromion Bone Region  Mild depletion  Scapular Bone Region  Mild depletion  Dorsal Hand  No depletion  Patellar Region  Mild depletion  Anterior Thigh Region  Mild depletion  Posterior Calf Region  Mild depletion  Edema (RD Assessment)  None  Hair  Reviewed  Eyes  Reviewed  Mouth  Reviewed  Skin  Reviewed  Nails  Reviewed     Diet Order:   Diet Order            Diet full liquid Room service appropriate? Yes; Fluid consistency: Thin  Diet effective now             EDUCATION NEEDS:   Education needs have been addressed  Skin:  Skin Assessment: Reviewed RN Assessment  Last BM:  8/27- type 7  Height:   Ht Readings from Last 1 Encounters:  06/10/18 5' 8"  (1.727 m)    Weight:   Wt Readings from Last 1 Encounters:  06/10/18 69.1 kg    Ideal Body Weight:  63.6 kg  BMI:  Body mass index is 23.16 kg/m.  Estimated Nutritional Needs:   Kcal:  1800-2100kcal/day   Protein:  90-104g/day   Fluid:  >1.9L/day   Koleen Distance MS, RD, LDN Pager #- [phone] Office#- [phone] After Hours Pager: [phone]

## 2018-06-12 ENCOUNTER — Telehealth: Payer: Self-pay | Admitting: Family Medicine

## 2018-06-12 LAB — BASIC METABOLIC PANEL
Anion gap: 6 (ref 5–15)
BUN: <5 mg/dL — ABNORMAL LOW (ref 6–20)
CALCIUM: 8.7 mg/dL — AB (ref 8.9–10.3)
CO2: 26 mmol/L (ref 22–32)
CREATININE: 0.61 mg/dL (ref 0.44–1.00)
Chloride: 106 mmol/L (ref 98–111)
Glucose, Bld: 78 mg/dL (ref 70–99)
Potassium: 3.9 mmol/L (ref 3.5–5.1)
Sodium: 138 mmol/L (ref 135–145)

## 2018-06-12 LAB — MAGNESIUM: MAGNESIUM: 1.4 mg/dL — AB (ref 1.7–2.4)

## 2018-06-12 LAB — PHOSPHORUS: PHOSPHORUS: 4.2 mg/dL (ref 2.5–4.6)

## 2018-06-12 SURGERY — COLONOSCOPY WITH PROPOFOL
Anesthesia: General

## 2018-06-12 MED ORDER — MAGNESIUM SULFATE 2 GM/50ML IV SOLN
2.0000 g | Freq: Once | INTRAVENOUS | Status: AC
  Administered 2018-06-12: 08:00:00 2 g via INTRAVENOUS
  Filled 2018-06-12: qty 50

## 2018-06-12 MED ORDER — COLESTIPOL HCL 1 G PO TABS: 2.0000 g | ORAL_TABLET | Freq: Two times a day (BID) | ORAL | 0 refills | Status: DC

## 2018-06-12 MED ORDER — HEPARIN SOD (PORK) LOCK FLUSH 100 UNIT/ML IV SOLN
500.0000 [IU] | Freq: Once | INTRAVENOUS | Status: AC
  Administered 2018-06-12: 500 [IU] via INTRAVENOUS
  Filled 2018-06-12: qty 5

## 2018-06-12 NOTE — Discharge Instructions (Signed)
Resume diet and activity as before ° ° °

## 2018-06-12 NOTE — Telephone Encounter (Signed)
Copied from [place] [id number]. Topic: Appointment Scheduling - Scheduling Inquiry for Clinic >> Jun 12, 2018  8:46 AM Cecelia Byars, NT wrote: Reason for MMO:[id number] is being discharged from [facility] on today and needs a follow up in a week please advise

## 2018-06-13 ENCOUNTER — Telehealth: Payer: Self-pay

## 2018-06-13 LAB — URINE CULTURE: Culture: >=100000 — AB

## 2018-06-13 NOTE — Telephone Encounter (Signed)
Transition Care Management Follow-up Telephone Call  Date of discharge and from where: 06/12/18 from [facility]  How have you been since you were released from the hospital? States her diarrhea has not resolved and is no longer having abd pain. States she did not need to f/u with GI for 2-3 mo but will be following up with oncologist next week. Possibility that diarrhea may be r/t to chemo.  Any questions or concerns? No   Items Reviewed:  Did the pt receive and understand the discharge instructions provided? Yes   Medications obtained and verified? Yes   Any new allergies since your discharge? No   Dietary orders reviewed? Yes  Do you have support at home? Yes   Other (ie: DME, Home Health, etc) N/A  Functional Questionnaire: (I = Independent and D = Dependent) ADL's: I  Bathing/Dressing- I   Meal Prep- I  Eating- I  Maintaining continence- I  Transferring/Ambulation- I  Managing Meds- I   Follow up appointments reviewed:    PCP Hospital f/u appt confirmed? At the time of this entry, pt has not yet scheduled a hosp f/u appt with Dr. Sanda Klein. A message has been sent to Dr. Delight Ovens CMA. Pt is aware that she will be receiving a call re: her appt.   [facility] f/u appt confirmed? Yes   Are transportation arrangements needed? No   If their condition worsens, is the pt aware to call  their PCP or go to the ED? Yes  Was the patient provided with contact information for the PCP's office or ED? Yes  Was the pt encouraged to call back with questions or concerns? Yes

## 2018-06-14 ENCOUNTER — Ambulatory Visit: Payer: Medicaid Other | Admitting: Hematology and Oncology

## 2018-06-14 ENCOUNTER — Telehealth: Payer: Self-pay | Admitting: *Deleted

## 2018-06-14 ENCOUNTER — Inpatient Hospital Stay: Payer: Medicaid Other

## 2018-06-14 DIAGNOSIS — Z923 Personal history of irradiation: Secondary | ICD-10-CM | POA: Diagnosis not present

## 2018-06-14 DIAGNOSIS — R1033 Periumbilical pain: Secondary | ICD-10-CM | POA: Diagnosis not present

## 2018-06-14 DIAGNOSIS — C538 Malignant neoplasm of overlapping sites of cervix uteri: Secondary | ICD-10-CM

## 2018-06-14 DIAGNOSIS — R110 Nausea: Secondary | ICD-10-CM | POA: Diagnosis not present

## 2018-06-14 DIAGNOSIS — Z5111 Encounter for antineoplastic chemotherapy: Secondary | ICD-10-CM | POA: Diagnosis not present

## 2018-06-14 DIAGNOSIS — C7802 Secondary malignant neoplasm of left lung: Secondary | ICD-10-CM | POA: Diagnosis not present

## 2018-06-14 DIAGNOSIS — E871 Hypo-osmolality and hyponatremia: Secondary | ICD-10-CM | POA: Diagnosis not present

## 2018-06-14 DIAGNOSIS — K529 Noninfective gastroenteritis and colitis, unspecified: Secondary | ICD-10-CM | POA: Diagnosis not present

## 2018-06-14 DIAGNOSIS — Z79899 Other long term (current) drug therapy: Secondary | ICD-10-CM | POA: Diagnosis not present

## 2018-06-14 DIAGNOSIS — R7989 Other specified abnormal findings of blood chemistry: Secondary | ICD-10-CM | POA: Diagnosis not present

## 2018-06-14 LAB — CBC WITH DIFFERENTIAL/PLATELET
Basophils Absolute: 0 10*3/uL (ref 0–0.1)
Basophils Relative: 0 %
Eosinophils Absolute: 0.2 10*3/uL (ref 0–0.7)
Eosinophils Relative: 2 %
HCT: 33.7 % — ABNORMAL LOW (ref 35.0–47.0)
Hemoglobin: 11.5 g/dL — ABNORMAL LOW (ref 12.0–16.0)
Lymphocytes Relative: 15 %
Lymphs Abs: 1.1 10*3/uL (ref 1.0–3.6)
MCH: 33.4 pg (ref 26.0–34.0)
MCHC: 34.2 g/dL (ref 32.0–36.0)
MCV: 97.7 fL (ref 80.0–100.0)
Monocytes Absolute: 0.3 10*3/uL (ref 0.2–0.9)
Monocytes Relative: 4 %
Neutro Abs: 5.7 10*3/uL (ref 1.4–6.5)
Neutrophils Relative %: 79 %
Platelets: 117 10*3/uL — ABNORMAL LOW (ref 150–440)
RBC: 3.45 MIL/uL — ABNORMAL LOW (ref 3.80–5.20)
RDW: 12.9 % (ref 11.5–14.5)
WBC: 7.3 10*3/uL (ref 3.6–11.0)

## 2018-06-14 LAB — COMPREHENSIVE METABOLIC PANEL
ALT: 47 U/L — ABNORMAL HIGH (ref 0–44)
AST: 36 U/L (ref 15–41)
Albumin: 3.8 g/dL (ref 3.5–5.0)
Alkaline Phosphatase: 180 U/L — ABNORMAL HIGH (ref 38–126)
Anion gap: 7 (ref 5–15)
BUN: 12 mg/dL (ref 6–20)
CO2: 21 mmol/L — ABNORMAL LOW (ref 22–32)
Calcium: 8.9 mg/dL (ref 8.9–10.3)
Chloride: 108 mmol/L (ref 98–111)
Creatinine, Ser: 0.6 mg/dL (ref 0.44–1.00)
GFR calc Af Amer: >60 mL/min (ref >60)
GFR calc non Af Amer: >60 mL/min (ref >60)
Glucose, Bld: 107 mg/dL — ABNORMAL HIGH (ref 70–99)
Potassium: 4 mmol/L (ref 3.5–5.1)
Sodium: 136 mmol/L (ref 135–145)
Total Bilirubin: 0.4 mg/dL (ref 0.3–1.2)
Total Protein: 7.1 g/dL (ref 6.5–8.1)

## 2018-06-14 LAB — MAGNESIUM: Magnesium: 1.6 mg/dL — ABNORMAL LOW (ref 1.7–2.4)

## 2018-06-14 NOTE — Telephone Encounter (Signed)
Called patient to inquire if she is taking magnesium.  Had to LVM.  Asked patient to call back.

## 2018-06-18 ENCOUNTER — Telehealth: Payer: Self-pay

## 2018-06-18 ENCOUNTER — Telehealth: Payer: Self-pay | Admitting: Nurse Practitioner

## 2018-06-18 ENCOUNTER — Encounter: Payer: Self-pay | Admitting: Hematology and Oncology

## 2018-06-18 NOTE — Telephone Encounter (Signed)
Patient needs seen to be evaluated for pain before something can be called in _________________  Notes recorded by Chilton Greathouse, CMA on 06/18/2018 at 4:33 PM EDT Patient called. Patient states that she got antibiotics through IV. She has began to hurt again and would like a rx sent to [facility] in [place]. ------  Notes recorded by Fredderick Severance, NP on 06/18/2018 at 12:45 PM EDT Please call this patient _______ Received urine culture report from the hospital showing she has UTI from e.coli. I do not see that she was started on antibiotics. Can you clarify if she was or was not. If not I can send in a rx to her preferred pharmacy.  Thanks.

## 2018-06-18 NOTE — Telephone Encounter (Signed)
EMMI call flagged due to sad, hopeless, anxious. [name] spoke with patient regarding EMMI flag. Patient states that she is doing ok since her discharge and has recently started chemotherapy. Patient states that she was feeling anxious at the time of the automated calls. Patient states that she has dealt with depression and anxiety for many years and has been taking Zoloft. Patient also states that her daughter died recently. Patient is open to seeing a mental health provider. [name] gave patient information for [facility]. Patient states that she plans to follow up with a mental health provider. Patient states that she has a lot of family support. Patient denies any SI/HI. She reports no other concerns or issues.   [place], Joanna Hall

## 2018-06-19 ENCOUNTER — Encounter: Payer: Self-pay | Admitting: Hematology and Oncology

## 2018-06-19 NOTE — Telephone Encounter (Signed)
Patient states she is not feeling well and will no be able to make it today to leave urine sample for dysuria. Will try to come tomorrow.

## 2018-06-20 ENCOUNTER — Encounter: Payer: Self-pay | Admitting: Hematology and Oncology

## 2018-06-21 ENCOUNTER — Ambulatory Visit
Admission: RE | Admit: 2018-06-21 | Discharge: 2018-06-21 | Disposition: A | Discharge status: Home / Self Care | Payer: Medicaid Other | Source: Ambulatory Visit | Attending: Oncology | Admitting: Oncology

## 2018-06-21 ENCOUNTER — Encounter: Payer: Self-pay | Admitting: Oncology

## 2018-06-21 ENCOUNTER — Other Ambulatory Visit: Payer: Self-pay | Admitting: Oncology

## 2018-06-21 ENCOUNTER — Telehealth: Payer: Self-pay | Admitting: *Deleted

## 2018-06-21 ENCOUNTER — Inpatient Hospital Stay: Payer: Medicaid Other | Attending: Hematology and Oncology

## 2018-06-21 ENCOUNTER — Ambulatory Visit: Payer: Medicaid Other | Admitting: Gastroenterology

## 2018-06-21 ENCOUNTER — Inpatient Hospital Stay (HOSPITAL_BASED_OUTPATIENT_CLINIC_OR_DEPARTMENT_OTHER): Payer: Medicaid Other | Admitting: Oncology

## 2018-06-21 ENCOUNTER — Other Ambulatory Visit: Payer: Self-pay

## 2018-06-21 ENCOUNTER — Inpatient Hospital Stay: Payer: Medicaid Other

## 2018-06-21 DIAGNOSIS — R197 Diarrhea, unspecified: Secondary | ICD-10-CM

## 2018-06-21 DIAGNOSIS — K529 Noninfective gastroenteritis and colitis, unspecified: Secondary | ICD-10-CM

## 2018-06-21 DIAGNOSIS — R7989 Other specified abnormal findings of blood chemistry: Secondary | ICD-10-CM | POA: Insufficient documentation

## 2018-06-21 DIAGNOSIS — E871 Hypo-osmolality and hyponatremia: Secondary | ICD-10-CM | POA: Insufficient documentation

## 2018-06-21 DIAGNOSIS — C7802 Secondary malignant neoplasm of left lung: Secondary | ICD-10-CM

## 2018-06-21 DIAGNOSIS — Z87891 Personal history of nicotine dependence: Secondary | ICD-10-CM | POA: Insufficient documentation

## 2018-06-21 DIAGNOSIS — R109 Unspecified abdominal pain: Secondary | ICD-10-CM | POA: Diagnosis present

## 2018-06-21 DIAGNOSIS — R1033 Periumbilical pain: Secondary | ICD-10-CM

## 2018-06-21 DIAGNOSIS — Z96652 Presence of left artificial knee joint: Secondary | ICD-10-CM | POA: Insufficient documentation

## 2018-06-21 DIAGNOSIS — Z79899 Other long term (current) drug therapy: Secondary | ICD-10-CM

## 2018-06-21 DIAGNOSIS — C538 Malignant neoplasm of overlapping sites of cervix uteri: Secondary | ICD-10-CM

## 2018-06-21 DIAGNOSIS — R110 Nausea: Secondary | ICD-10-CM | POA: Diagnosis not present

## 2018-06-21 DIAGNOSIS — Z5111 Encounter for antineoplastic chemotherapy: Secondary | ICD-10-CM | POA: Insufficient documentation

## 2018-06-21 DIAGNOSIS — Z923 Personal history of irradiation: Secondary | ICD-10-CM | POA: Insufficient documentation

## 2018-06-21 DIAGNOSIS — R112 Nausea with vomiting, unspecified: Secondary | ICD-10-CM

## 2018-06-21 DIAGNOSIS — E860 Dehydration: Secondary | ICD-10-CM

## 2018-06-21 DIAGNOSIS — E538 Deficiency of other specified B group vitamins: Secondary | ICD-10-CM | POA: Insufficient documentation

## 2018-06-21 DIAGNOSIS — G893 Neoplasm related pain (acute) (chronic): Secondary | ICD-10-CM | POA: Insufficient documentation

## 2018-06-21 DIAGNOSIS — Z95828 Presence of other vascular implants and grafts: Secondary | ICD-10-CM

## 2018-06-21 LAB — CBC WITH DIFFERENTIAL/PLATELET
BASOS PCT: 0 %
Basophils Absolute: 0 10*3/uL (ref 0–0.1)
Eosinophils Absolute: 0 10*3/uL (ref 0–0.7)
Eosinophils Relative: 1 %
HEMATOCRIT: 36.5 % (ref 35.0–47.0)
Hemoglobin: 12.4 g/dL (ref 12.0–16.0)
Lymphocytes Relative: 24 %
Lymphs Abs: 1.4 10*3/uL (ref 1.0–3.6)
MCH: 33.2 pg (ref 26.0–34.0)
MCHC: 34.1 g/dL (ref 32.0–36.0)
MCV: 97.5 fL (ref 80.0–100.0)
MONO ABS: 0.3 10*3/uL (ref 0.2–0.9)
Monocytes Relative: 6 %
NEUTROS ABS: 4.2 10*3/uL (ref 1.4–6.5)
NEUTROS PCT: 69 %
Platelets: 174 10*3/uL (ref 150–440)
RBC: 3.74 MIL/uL — ABNORMAL LOW (ref 3.80–5.20)
RDW: 13.3 % (ref 11.5–14.5)
WBC: 6 10*3/uL (ref 3.6–11.0)

## 2018-06-21 LAB — COMPREHENSIVE METABOLIC PANEL
ALK PHOS: 148 U/L — AB (ref 38–126)
ALT: 54 U/L — AB (ref 0–44)
ANION GAP: 9 (ref 5–15)
AST: 39 U/L (ref 15–41)
Albumin: 4.2 g/dL (ref 3.5–5.0)
BILIRUBIN TOTAL: 0.5 mg/dL (ref 0.3–1.2)
BUN: 6 mg/dL (ref 6–20)
CALCIUM: 9.6 mg/dL (ref 8.9–10.3)
CO2: 23 mmol/L (ref 22–32)
Chloride: 105 mmol/L (ref 98–111)
Creatinine, Ser: 0.5 mg/dL (ref 0.44–1.00)
GFR calc non Af Amer: >60 mL/min (ref >60)
GLUCOSE: 83 mg/dL (ref 70–99)
Potassium: 3.7 mmol/L (ref 3.5–5.1)
Sodium: 137 mmol/L (ref 135–145)
TOTAL PROTEIN: 7.8 g/dL (ref 6.5–8.1)

## 2018-06-21 LAB — AMYLASE: AMYLASE: 68 U/L (ref 28–100)

## 2018-06-21 LAB — MAGNESIUM: Magnesium: 1.5 mg/dL — ABNORMAL LOW (ref 1.7–2.4)

## 2018-06-21 MED ORDER — ONDANSETRON HCL 8 MG PO TABS: 8.0000 mg | ORAL_TABLET | Freq: Three times a day (TID) | ORAL | 0 refills | Status: DC | PRN

## 2018-06-21 MED ORDER — ONDANSETRON HCL 4 MG/2ML IJ SOLN
8.0000 mg | Freq: Once | INTRAMUSCULAR | Status: AC
  Administered 2018-06-21: 8 mg via INTRAVENOUS
  Filled 2018-06-21: qty 4

## 2018-06-21 MED ORDER — PROMETHAZINE HCL 25 MG PO TABS: 25.0000 mg | ORAL_TABLET | Freq: Four times a day (QID) | ORAL | 0 refills | Status: DC | PRN

## 2018-06-21 MED ORDER — SODIUM CHLORIDE 0.9 % IV SOLN: 2.0000 g | Freq: Once | INTRAVENOUS | Status: DC

## 2018-06-21 MED ORDER — TRAMADOL HCL 50 MG PO TABS: 50.0000 mg | ORAL_TABLET | Freq: Four times a day (QID) | ORAL | 0 refills | Status: DC | PRN

## 2018-06-21 MED ORDER — MAGNESIUM SULFATE 2 GM/50ML IV SOLN
2.0000 g | Freq: Once | INTRAVENOUS | Status: AC
  Administered 2018-06-21: 2 g via INTRAVENOUS

## 2018-06-21 MED ORDER — HEPARIN SOD (PORK) LOCK FLUSH 100 UNIT/ML IV SOLN
500.0000 [IU] | Freq: Once | INTRAVENOUS | Status: AC
  Administered 2018-06-21: 500 [IU] via INTRAVENOUS

## 2018-06-21 MED ORDER — MAGNESIUM SULFATE 2 GM/50ML IV SOLN
2.0000 g | Freq: Once | INTRAVENOUS | Status: DC
  Filled 2018-06-21: qty 50

## 2018-06-21 MED ORDER — SODIUM CHLORIDE 0.9 % IV SOLN
INTRAVENOUS | Status: DC
  Administered 2018-06-21: 13:00:00 via INTRAVENOUS
  Filled 2018-06-21: qty 250

## 2018-06-21 MED ORDER — DEXAMETHASONE SODIUM PHOSPHATE 10 MG/ML IJ SOLN
10.0000 mg | Freq: Once | INTRAMUSCULAR | Status: AC
  Administered 2018-06-21: 10 mg via INTRAVENOUS
  Filled 2018-06-21: qty 1

## 2018-06-21 MED ORDER — SODIUM CHLORIDE 0.9% FLUSH
10.0000 mL | INTRAVENOUS | Status: DC | PRN
  Administered 2018-06-21: 10 mL via INTRAVENOUS
  Filled 2018-06-21: qty 10

## 2018-06-21 MED ORDER — SODIUM CHLORIDE 0.9 % IV SOLN: Freq: Once | INTRAVENOUS | Status: DC

## 2018-06-21 NOTE — Telephone Encounter (Signed)
Patient called and asks to be seen reporting 4 days of diarrhea and vomiting. Discussed with NP burns who has order xray, lab and for patient to be seen. Patient agrees to come to get xr at 1230 then come to the CC afterwards for labs at 115 and NP at 130

## 2018-06-21 NOTE — Progress Notes (Addendum)
Symptom Management Consult note [facility]  Telephone:([phone]) [phone] [phone]  Patient Care Team: Arnetha Courser, MD as PCP - General (Family Medicine) Mellody Drown, MD as Consulting Physician (Obstetrics and Gynecology) Lenor Coffin, MD as Attending Physician (Gynecology) Lequita Asal, MD as Medical Oncologist (Hematology and Oncology) [name], MD as Consulting Physician (Gastroenterology) Michael Boston, MD as Consulting Physician (General Surgery) Lovell Sheehan, MD as Consulting Physician (Orthopedic Surgery) Clent Jacks, RN as Registered Nurse   Name of the patient: Joanna Hall  [id number]  Jul 08, 1966   Date of visit: 06/21/18  Diagnosis- Ovarian cancer   Chief complaint/ Reason for visit- diarrhea/abdominal pain  Heme/Onc history: Patient was last seen by Dr. Mike Gip primary medical oncologist on 06/04/2018 for assessment prior to cycle 1 of carbo/Taxol.  She stated she has been feeling much better.  The diarrhea she had had previously have resolved.  She continued Questran twice daily and continued Bentyl.  She denied any hematochezia.  She denied fevers.  She was seen and [facility] clinic on 05/30/2018 for diarrhea.  Dr. Marius Ditch was consulted and Lucrezia Starch was increased to 3 times daily.  Lomotil was added every 6 hours.  Oral magnesium was discontinued.  She received 4 g of IV magnesium.  She received IV fluids for orthostatic hypotension.  RTC on 05/31/2018 for lab monitoring.  Mag continued to be low at 1.6.  She was given an additional 2 g of IV magnesium.  Diarrhea and abdominal pain returned after initiating cycle 1 of carbo/Taxol and she was admitted to the hospital (06/10/18-06/12/18).  Imaging revealed findings of possible dilated common bile duct.  Subsequently an MRCP was performed.  Patient had normal LFTs and bilirubin and GI did not advise any further work-up.  She was to follow-up outpatient.   She  was started on Colestid 2 g daily at discharge from hospital and instructed to discontinue Questran.   Oncology History   Patient was diagnosed with cervical adenocarcinoma in [place] in 09/2016.  She has had a long-standing history of abnormal Pap smears.  She presented with an ovarian cyst.  Laparoscopic surgery was pursued but was difficult due to scar tissue.  Had BSO and rupture of cyst with purulent drainage into the abdomen.  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  Radical hysterectomy was aborted.  She was treated by Dr. Christene Slates at [facility] in [place], [place].  Decision was made to pursue concurrent chemotherapy (weekly cisplatin) and radiation.  She received treatment from 11/2016-05/2017.  01/2017 cisplatin x 2 and carboplatin x 1 (01/29/2017) due to ARF and XRT.  XRT was followed by T & O on 02/01/2017 and T & N 02/10/2017 and 02/20/2017.  Course was complicated by 80 pound weight loss, nausea, vomiting, electrolyte wasting (potassium and magnesium).  She describes that.  Is been sick constantly requiring at least 20 hospitalizations.  Follow-up CT chest and PET on 06/2017. Per patient, 'radiation worked' and no disease in the abdomen.  At that time she was noted to have lung nodules that were growing and follow-up imaging was scheduled for 10/2017.  She was admitted to hospital in [place] for small bowel obstruction which was managed conservatively and she was home for a week prior to traveling to [place] for Thanksgiving holiday where she has family.  She presented to ER in [place] on 08/2017 with nausea, vomiting, and lower abdominal pain.  Symptoms  did not respond to conservative treatment.  CT on 08/26/2017 revealed small bowel obstruction with transition in the pelvis just superior to the uterus rather was a long segment of distal ileum with fatty wall thickening compatible with chronic inflammation and/or radiation  enteritis. Imaging showed numerous pulmonary nodules consistent with metastatic disease. She underwent laparotomy and right ileocolectomy on 08/31/2017 at [facility].  Surgical findings revealed a thickened, matted, and scarred piece of distal small bowel close to the ileocecal valve.  She was discharged on 09/05/2017.  Pain markedly increased in intensity and imaging was performed on 09/11/2017 which revealed: Debris within the anterior abdominal wall incision concerning for infection versus packing material, s/p post ileo-colectomy with expected postoperative changes, mild colonic ileus, numerous pulmonary nodules highly concerning for metastatic disease, punctate nonobstructing nephrolithiasis.  Staples were removed and one was packed.  She was started on doxycycline.  Abdominal and pelvic CT without contrast on 09/11/2017 revealed debris within anterior abdominal wall incision concerning for infection, versus packing material.She is s/pileocolectomy with expected postoperative changes and mild colonic ileus. There were numerous pulmonary nodules highly concerning for metastatic diseaseand punctate nonobstructing nephrolithiasis. She was readmitted on 09/12/2017. She describes the onset of lower abdominal pain on 09/09/2017. Pain markedly increased in intensity on 09/11/2017.  Staples were removed and the wound packed. She was started on doxycycline.  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  PET scan was ordered to evaluate enlarging lung nodules with concern for recurrent cervical cancer but scan was delayed due to  insurance and need to be performed in [place].  Presented to ER on 12/03/2017 for abdominal pain and emesis.  Imaging concerning for worsening possible uterine infarct and she was admitted to hospital.  Pelvic MRI was unremarkable.  Remote scarring type changes of uterus thought to be possibly related to radiation.  She was discharged on 12/08/2017.  Underwent endoscopy and colonoscopy on 12/20/2017.    On 02/22/2018 she underwent laser ablation of condylomata around the anus and vagina under anesthesia with Dr. Johney Maine.   04/15/2018: Chest, abdomen, and pelvis CTrevealed innumerable (>100) cavitary nodules scattered in the lungs, moderately enlarging compared to the 11/08/2017 PET-CT, suspicious for metastatic disease. One index node in the RLL measures 1.0 x 1.1 cm (previously 0.6 x 0.6 cm). There were no new nodules. There was an ill-defined wall thickening in the rectosigmoid with surrounding stranding along fascia planes, probably sequela from prior radiation therapy. There was multilevel lumbar impingement due to spondylosis and degenerative disc disease. There was heterogeneous enhancement in the uterus (some possibly from prior radiation therapy).  04/23/2018:PET scan revealednumerous scattered solid and cavitary nodules in the lungs stable increased in size compared to the prior PET-CT from 11/08/2017.Largest nodule was 1.1 cm in the LUL (SUV 1.9).These demonstratedlow-grade metabolic activity up to a maximum SUV of about 2.3, increased from 11/08/2017.   Case was discussed at tumor board on 04/25/2018. Consensus to pursue CT-guided biopsy (05/06/18) which revealed: Metastatic adenocarcinoma, morphologically consistent with cervical adenocarcinoma.  She has history of chronic hepatitis C which is managed by GI.  Hepatitis C genotype is 2a/2c.  She receives B12 injections for history of B12 deficiency.  On 04/24/2018 she underwent left total knee replacement with Dr. [name].       Malignant neoplasm of overlapping sites of cervix ([place])   09/18/2017 Initial Diagnosis    Malignant neoplasm of overlapping sites of cervix ([place])    05/15/2018 -  Chemotherapy    The patient had palonosetron (ALOXI) injection 0.25 mg, 0.25 mg, Intravenous,  Once, 2 of 6 cycles Administration: 0.25 mg (06/04/2018) pegfilgrastim (NEULASTA) injection 6 mg, 6 mg, Subcutaneous, Once, 2 of 6 cycles Administration: 6 mg (06/06/2018) CARBOplatin (PARAPLATIN) 500 mg in sodium chloride 0.9 % 250 mL chemo infusion, 500 mg (100 % of original dose 497.2 mg), Intravenous,  Once, 2 of 6 cycles Dose modification:   (original dose 497.2 mg, Cycle 1, Reason: Provider Judgment, Comment: difficulty with counts with initial chemo in [place]; advance dose as tolerated) Administration: 500 mg (06/04/2018) PACLitaxel (TAXOL) 264 mg in sodium chloride 0.9 % 250 mL chemo infusion (> 44m/m2), 140 mg/m2 = 264 mg (100 % of original dose 140 mg/m2), Intravenous,  Once, 2 of 6 cycles Dose modification: 140 mg/m2 (original dose 140 mg/m2, Cycle 1, Reason: Provider Judgment, Comment: difficulty with counts [place] advance dose as tolerated), 155 mg/m2 (original dose 140 mg/m2, Cycle 2, Reason: Provider Judgment, Comment: advance as tolerated) Administration: 264 mg (06/04/2018)  for chemotherapy treatment.     Interval history-  Today, she complains of abdominal pain.  The pain is described as cramping and is 7 out of 10 intensity.  Pain is located in the right lower quadrant without radiation.  Onset was approximately 4 days ago.  Symptoms have been gradually worsening since.  Having 5-6 bowel movements daily.  Also admits to nausea and vomiting.  Unable to keep anything down.  Vomiting yellow bile.  Denies fever but admits to chills.  Has been taking Tylenol PRN.  Was recently changed from QStarbrick3 times daily to Colestid 2 times a day without improvement of diarrhea.  She has not been taking  Lomotil.  She denies constipation, headaches, melena, hematochezia or urinary symptoms.  ECOG FS:1 - Symptomatic but completely ambulatory  Review of systems- Review of Systems  Constitutional: Positive for chills and malaise/fatigue. Negative for fever and weight loss.  HENT: Negative for congestion and ear pain.   Eyes: Negative.  Negative for blurred vision and double vision.  Respiratory: Negative.  Negative for cough, sputum production and shortness of breath.   Cardiovascular: Negative.  Negative for chest pain, palpitations and leg swelling.  Gastrointestinal: Positive for abdominal pain, diarrhea (6-8 stools daily), nausea and vomiting. Negative for constipation.  Genitourinary: Negative for dysuria, frequency and urgency.  Musculoskeletal: Positive for joint pain. Negative for back pain and falls.  Skin: Negative.  Negative for rash.  Neurological: Negative.  Negative for weakness and headaches.  Endo/Heme/Allergies: Negative.  Does not bruise/bleed easily.  Psychiatric/Behavioral: Negative.  Negative for depression. The patient is not nervous/anxious and does not have insomnia.      Current treatment- s/p 1 cycle carbo/Taxol.  Last given on 06/04/2018.  Received Neulasta on 06/06/2018.  Allergies  Allergen Reactions  . Ketamine Anxiety and Other (See Comments)    Syncope episode/confusion      Past Medical History:  Diagnosis Date  . Abnormal cervical Papanicolaou smear 09/18/2017  . Anxiety   . Aortic atherosclerosis ([place]   . Arthritis    neck and knees  . 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  Blood transfusion without reported diagnosis   . Cervical cancer (HCC)    mets lung  . Chronic anal fissure   . Chronic diarrhea   . Dyspnea   . Factor V Leiden mutation ([place])   . Fecal incontinence   . Genital warts   . GERD (gastroesophageal reflux disease)   . Heart murmur   . Hemorrhoids   . 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 History of cancer chemotherapy    completed 06/2017  . History of Clostridium difficile infection    while undergoing chemo.  Negative test 10/2017  . 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 Intestinal infection due to Clostridium difficile 09/18/2017  . Macrocytic anemia with vitamin B12 deficiency   . Perianal condylomata   . Pneumonia    History of  . Pulmonary nodules   . Rectal bleeding   . Small bowel obstruction ([place]) 08/2017  . Stiff neck    limited right turn  . Vitamin D deficiency      Past Surgical History:  Procedure Laterality Date  . CHOLECYSTECTOMY    . COLON SURGERY  08/2017   resection  . COLONOSCOPY WITH PROPOFOL N/A 12/20/2017   Procedure: COLONOSCOPY WITH PROPOFOL;  Surgeon: [name], MD;  Location[facility] ENDOSCOPY;  Service: Gastroenterology;  Laterality: N/A;  . DIAGNOSTIC LAPAROSCOPY    . ESOPHAGOGASTRODUODENOSCOPY (EGD) WITH PROPOFOL N/A 12/20/2017   Procedure: ESOPHAGOGASTRODUODENOSCOPY (EGD) WITH PROPOFOL;  Surgeon: [name], MD;  Location: [facility];  Service: Gastroenterology;  Laterality: N/A;  . LAPAROTOMY N/A 08/31/2017   Procedure: EXPLORATORY LAPAROTOMY for SBO, ileocolectomy, removal of piece of uterine wall;  Surgeon: [name], MD;  Location: [facility] ORS;  Service: General;  Laterality: N/A;  . LASER ABLATION CONDOLAMATA N/A 02/22/2018   Procedure: LASER ABLATION/REMOVAL OF BPZWCHENIDP AROUND ANUS AND VAGINA;  Surgeon: Michael Boston, MD;  Location: [place];  Service: General;  Laterality: N/A;  . OOPHORECTOMY    . PORTA CATH INSERTION N/A 05/13/2018   Procedure: PORTA CATH INSERTION;  Surgeon: Katha Cabal, MD;  Location: [place] CV LAB;  Service: Cardiovascular;  Laterality: N/A;  . SMALL INTESTINE SURGERY    . TANDEM RING INSERTION     x3  . THORACOTOMY    . TOTAL KNEE ARTHROPLASTY Left 04/24/2018   Procedure: TOTAL KNEE ARTHROPLASTY;  Surgeon: Lovell Sheehan,  MD;  Location: [facility] ORS;  Service: Orthopedics;  Laterality: Left;    Social History   Socioeconomic History  . Marital status: Legally Separated    Spouse name: Not on file  . Number of children: Not on file  . Years of education: Not on file  . Highest education level: Not on file  Occupational History  . Not on file  Social Needs  . Financial resource strain: Not on file  . Food insecurity:    Worry: Not on file    Inability: Not on file  . Transportation needs:    Medical: Not on file    Non-medical: Not on file  Tobacco Use  . Smoking status: Former Smoker    Last attempt to quit: 10/16/2006    Years since quitting: 11.6  . Smokeless tobacco: Never Used  Substance and Sexual Activity  . Alcohol use: Yes    Frequency: Never    Comment: seldom  . Drug use: Yes    Types: Marijuana  . Sexual activity:  Not Currently    Birth control/protection: Post-menopausal    Comment: Not Asked  Lifestyle  . Physical activity:    Days per week: Not on file    Minutes per session: Not on file  . Stress: Not on file  Relationships  . Social connections:    Talks on phone: Not on file    Gets together: Not on file    Attends religious service: Not on file    Active member of club or organization: Not on file    Attends meetings of clubs or organizations: Not on file    Relationship status: Not on file  . Intimate partner violence:    Fear of current or ex partner: Not on file    Emotionally abused: Not on file    Physically abused: Not on file    Forced sexual activity: Not on file  Other Topics Concern  . Not on file  Social History Narrative  . Not on file    Family History  Problem Relation Age of Onset  . Hypertension Father   . Diabetes Father   . Alcohol abuse Daughter   . Hypertension Maternal Grandmother   . Diabetes Maternal Grandmother   . Diabetes Paternal Grandmother   . Hypertension Paternal Grandmother      Current Outpatient Medications:  .   Calcium Carb-Cholecalciferol (CALCIUM 500 +D) 500-400 MG-UNIT TABS, Take 2 tablets by mouth daily., Disp: , Rfl:  .  colestipol (COLESTID) 1 g tablet, Take 2 tablets (2 g total) by mouth 2 (two) times daily., Disp: 60 tablet, Rfl: 0 .  cyanocobalamin (,VITAMIN B-12,) 1000 MCG/ML injection, Inject 1ML every week x 4 weeks, then 1ML every other week x 2 months, then monthly x 3 months (Patient taking differently: Inject 1,000 mcg into the muscle every 30 (thirty) days. Inject 1ML every week x 4 weeks, then 1ML every other week x 2 months, then monthly x 3 months), Disp: 10 mL, Rfl: 1 .  dicyclomine (BENTYL) 20 MG tablet, Take 1 tablet (20 mg total) by mouth every 6 (six) hours., Disp: 20 tablet, Rfl: 0 .  fluticasone (FLONASE) 50 MCG/ACT nasal spray, Place 2 sprays into both nostrils daily., Disp: 16 g, Rfl: 0 .  lidocaine-prilocaine (EMLA) cream, Apply 1 application topically as needed. Apply small amount to port site at least 1 hour prior to it being accessed, cover with plastic wrap, Disp: 30 g, Rfl: 3 .  loratadine (CLARITIN) 10 MG tablet, Take 10 mg by mouth daily., Disp: , Rfl:  .  LORazepam (ATIVAN) 0.5 MG tablet, Take 1 tablet (0.5 mg total) by mouth every 6 (six) hours as needed (nausea)., Disp: 30 tablet, Rfl: 0 .  Magnesium Bisglycinate (MAG GLYCINATE) 100 MG TABS, Take 100 mg by mouth daily., Disp: 30 tablet, Rfl: 3 .  magnesium oxide (MAG-OX) 400 MG tablet, Take 1 tablet (400 mg total) by mouth daily., Disp: 30 tablet, Rfl: 0 .  ondansetron (ZOFRAN) 8 MG tablet, Take 1 tablet (8 mg total) by mouth every 8 (eight) hours as needed for nausea or vomiting., Disp: 30 tablet, Rfl: 0 .  oxyCODONE 10 MG TABS, Take 1 tablet (10 mg total) by mouth every 4 (four) hours as needed for severe pain (pain score 7-10)., Disp: 30 tablet, Rfl: 0 .  rivaroxaban (XARELTO) 20 MG TABS tablet, Take 1 tablet (20 mg total) by mouth daily with supper., Disp: 30 tablet, Rfl: 1 .  sertraline (ZOLOFT) 50 MG tablet, Take 1  tablet (50  mg total) by mouth daily., Disp: 30 tablet, Rfl: 3 .  Syringe/Needle, Disp, (SYRINGE 3CC/22GX1") 22G X 1" 3 ML MISC, For use with Vitamin B12 injections, Disp: 50 each, Rfl: 0 .  traMADol (ULTRAM) 50 MG tablet, Take 1 tablet (50 mg total) by mouth every 6 (six) hours as needed., Disp: 60 tablet, Rfl: 0 .  VENTOLIN HFA 108 (90 Base) MCG/ACT inhaler, Inhale 1-2 puffs into the lungs every 4 (four) hours as needed for shortness of breath. , Disp: , Rfl: 0 .  promethazine (PHENERGAN) 25 MG tablet, Take 1 tablet (25 mg total) by mouth every 6 (six) hours as needed for nausea or vomiting., Disp: 30 tablet, Rfl: 0 No current facility-administered medications for this visit.   Facility-Administered Medications Ordered in Other Visits:  .  CARBOplatin (PARAPLATIN) 560 mg in sodium chloride 0.9 % 250 mL chemo infusion, 560 mg, Intravenous, Once, Corcoran, Melissa C, MD .  heparin lock flush 100 unit/mL, 500 Units, Intravenous, Once, Corcoran, Melissa C, MD .  PACLitaxel (TAXOL) 294 mg in sodium chloride 0.9 % 250 mL chemo infusion (> 73m/m2), 155 mg/m2 (Treatment Plan Recorded), Intravenous, Once, Corcoran, Melissa C, MD, Last Rate: 100 mL/hr at 06/25/18 1149, 294 mg at 06/25/18 1149 .  sodium chloride flush (NS) 0.9 % injection 10 mL, 10 mL, Intravenous, PRN, BFaythe CasaE, NP, 10 mL at 05/23/18 1225 .  sodium chloride flush (NS) 0.9 % injection 10 mL, 10 mL, Intravenous, PRN, CMike Gip Melissa C, MD, 10 mL at 06/10/18 0850  Physical exam:  Vitals:   06/21/18 1320  BP: 118/85  Pulse: 88  Resp: (!) 22  Temp: 97.7 F (36.5 C)  TempSrc: Tympanic   Physical Exam  Constitutional: She is oriented to person, place, and time. Vital signs are normal. She appears well-developed.  HENT:  Head: Normocephalic and atraumatic.  Eyes: Pupils are equal, round, and reactive to light.  Neck: Normal range of motion.  Cardiovascular: Normal rate, regular rhythm and normal heart sounds.  No murmur  heard. Pulmonary/Chest: Effort normal and breath sounds normal. She has no wheezes.  Abdominal: Soft. Normal appearance and bowel sounds are normal. She exhibits no distension. There is no tenderness.  Musculoskeletal: Normal range of motion. She exhibits no edema.  Neurological: She is alert and oriented to person, place, and time.  Skin: Skin is warm and dry. No rash noted. There is pallor.  Psychiatric: Judgment normal.  Nursing note and vitals reviewed.    CMP Latest Ref Rng & Units 06/25/2018  Glucose 70 - 99 mg/dL 87  BUN 6 - 20 mg/dL 15  Creatinine 0.44 - 1.00 mg/dL 0.60  Sodium 135 - 145 mmol/L 135  Potassium 3.5 - 5.1 mmol/L 4.2  Chloride 98 - 111 mmol/L 101  CO2 22 - 32 mmol/L 25  Calcium 8.9 - 10.3 mg/dL 9.5  Total Protein 6.5 - 8.1 g/dL 7.2  Total Bilirubin 0.3 - 1.2 mg/dL 0.6  Alkaline Phos 38 - 126 U/L 133(H)  AST 15 - 41 U/L 33  ALT 0 - 44 U/L 46(H)   CBC Latest Ref Rng & Units 06/25/2018  WBC 3.6 - 11.0 K/uL 4.5  Hemoglobin 12.0 - 16.0 g/dL 11.5(L)  Hematocrit 35.0 - 47.0 % 33.9(L)  Platelets 150 - 440 K/uL 170    No images are attached to the encounter.  Ct Abdomen Pelvis W Contrast  Result Date: 06/10/2018 CLINICAL DATA:  Diarrhea and abdominal cramping and pain. On chemotherapy for cervical cancer. Recent diagnosis of  colitis. History of RIGHT hemicolectomy, cholecystectomy, oophorectomy, hepatitis C. EXAM: CT ABDOMEN AND PELVIS WITH CONTRAST TECHNIQUE: Multidetector CT imaging of the abdomen and pelvis was performed using the standard protocol following bolus administration of intravenous contrast. CONTRAST:  118m ISOVUE-300 IOPAMIDOL (ISOVUE-300) INJECTION 61% COMPARISON:  CT abdomen and pelvis May 23, 2018. FINDINGS: LOWER CHEST: Numerous lung base pulmonary nodules, some of which are cavitated consistent with known metastatic disease. Included heart size is normal. No pericardial effusion. HEPATOBILIARY: Increased mild intrahepatic biliary dilatation.  Dilated common bile duct at 14 mm increased from prior examination. Tapered distal common bile duct without CT findings of choledocholithiasis. Mild focal fatty infiltration about the falciform ligament. Similar 13 mm PANCREAS: Normal. SPLEEN: Normal. ADRENALS/URINARY TRACT: Kidneys are orthotopic, demonstrating symmetric enhancement. 2 mm RIGHT lower pole, punctate RIGHT interpolar nephrolithiasis. No hydronephrosis or solid renal masses. The unopacified ureters are normal in course and caliber. Delayed imaging through the kidneys demonstrates symmetric prompt contrast excretion within the proximal urinary collecting system. Urinary bladder is partially distended with disproportionate wall thickening. Normal adrenal glands. STOMACH/BOWEL: Similar rectosigmoid wall thickening in edema with colonic air-fluid levels. Status post RIGHT hemicolectomy. Decompressed small-bowel. Normal appendix. VASCULAR/LYMPHATIC: Aortoiliac vessels are normal in course and caliber. Moderate intimal thickening calcific atherosclerosis. No lymphadenopathy by CT size criteria. REPRODUCTIVE: Increased free fluid at the level of cervix. OTHER: No intraperitoneal free fluid or free air. Presacral fat stranding. MUSCULOSKELETAL: Nonacute. Intra-abdominal wall scarring. Mild lumbar levoscoliosis, advanced lumbar spondylosis. Severe RIGHT L3-4 and L4-5 neural foraminal narrowing. Moderate to severe LEFT L5-S1 neural foraminal narrowing. IMPRESSION: 1. New intra and extrahepatic biliary dilatation, differential diagnosis includes stricture, occult cholelithiasis/sludge or obstructing lesion. Recommend correlation with LFTs. Recommend MRCP; ultrasound may be of added value and more expedient. 2. Similar colitis without complication, possibly postradiation related. Status post RIGHT hemicolectomy. 3. Bladder wall thickening concerning for cystitis. Increased cervical fluid, possibly treatment related. 4. Pulmonary metastasis. Aortic Atherosclerosis  (ICD10-I70.0). Electronically Signed   By: CElon AlasM.D.   On: 06/10/2018 14:52   Mr 3d Recon At Scanner  Addendum Date: 06/11/2018   ADDENDUM REPORT: 06/11/2018 17:00 ADDENDUM: The original report was by Dr. WVan Clines The following addendum is by Dr. WVan Clines Please note that the final sentence in impression # 1 should read, "Presumably there is a stricture of the AMPULLA contributing to the biliary dilatation on today's imaging and recent imaging." I discussed this with Dr. WAllen Norrisat 4:55 p.m. on 06/11/2018. He noted that currently the patient's bilirubin level is not elevated; based on this, most if not all of the currently visible biliary dilatation may be primarily a physiologic response to the patient's cholecystectomy. Electronically Signed   By: WVan ClinesM.D.   On: 06/11/2018 17:00   Result Date: 06/11/2018 CLINICAL DATA:  Abdominal pain, nausea, vomiting, and diarrhea. Chemotherapy last taken on Thursday for metastatic cervical cancer. Epigastric pain in the right upper quadrant. Dilated bile ducts on recent CT. Recent colitis. EXAM: MRI ABDOMEN WITHOUT AND WITH CONTRAST (INCLUDING MRCP) TECHNIQUE: Multiplanar multisequence MR imaging of the abdomen was performed both before and after the administration of intravenous contrast. Heavily T2-weighted images of the biliary and pancreatic ducts were obtained, and three-dimensional MRCP images were rendered by post processing. CONTRAST:  126mMULTIHANCE GADOBENATE DIMEGLUMINE 529 MG/ML IV SOLN COMPARISON:  Multiple exams, including CT scan from 06/10/2018 FINDINGS: Lower chest: Scattered pulmonary nodules in the lung bases similar to recent CT scan. As before, some of these are cavitary. Hepatobiliary: Mild  intrahepatic biliary dilatation. Common bile duct 1.3 cm in diameter, with conical distal tapering at the ampulla but without appreciable filling defect. Dilated cystic duct remnant. No abnormal enhancement along the  walls of the biliary tree or in the ampulla no appreciable hepatic metastatic disease. Pancreas: Unremarkable. The dorsal pancreatic duct is not dilated. No appreciable metastatic lesion to the pancreas. Spleen:  Unremarkable Adrenals/Urinary Tract:  Unremarkable Stomach/Bowel: Air-fluid levels in the distal colon compatible with diarrheal process. No significant degree of colon wall thickening where visualized. Vascular/Lymphatic: 1.3 cm porta hepatis node on image 16/7, stable. On the same image there is a separate 1.1 cm node on image 16/7 which is likewise stable. Small periaortic lymph nodes are not pathologically enlarged. Other:  No supplemental non-categorized findings. Musculoskeletal: Lumbar scoliosis with degenerative disc disease and degenerative endplate findings as well as spondylosis. IMPRESSION: 1. Although there is stable porta hepatis adenopathy, no mass or specific lesion is identified in the vicinity of the ampulla to explain the intrahepatic and extrahepatic biliary dilatation. No filling defect in the common bile duct or abnormal enhancement along the wall of the common bile duct. Presumably there is a stricture of the cystic duct contributing to the biliary dilatation on today's imaging and recent imaging. 2. Scattered air-fluid levels in the distal colon, indicating diarrheal process. 3. Stable scattered bibasilar pulmonary nodules compatible with metastatic disease. Some of these nodules are cavitary. 4. Notable degree of lumbar scoliosis, spondylosis, and degenerative disc disease. Electronically Signed: By: Van Clines M.D. On: 06/11/2018 07:45   Dg Abd 2 Views  Result Date: 06/21/2018 CLINICAL DATA:  Patient reports diarrhea and nausea/vomiting x 4 days. Reports hx of colon resection in November. Reports most of her abdominal pain is located in the RLQ. Also states she started chemo treatment 2 weeks ago to treat lung cancer. EXAM: ABDOMEN - 2 VIEW COMPARISON:  06/10/2018 CT  FINDINGS: There is paucity of bowel gas, limiting evaluation of bowel caliber. Bowel sutures are identified in the RIGHT mid abdomen. No evidence for free intraperitoneal air or pneumatosis. No evidence for organomegaly. Scoliosis and associated degenerative changes are seen in the spine. Scattered phleboliths are present in the pelvis. IMPRESSION: 1. Paucity of bowel gas. 2. No acute abnormality. Electronically Signed   By: Nolon Nations M.D.   On: 06/21/2018 12:55   Mr Abdomen Mrcp Moise Boring Contast  Addendum Date: 06/11/2018   ADDENDUM REPORT: 06/11/2018 17:00 ADDENDUM: The original report was by Dr. Van Clines. The following addendum is by Dr. Van Clines: Please note that the final sentence in impression # 1 should read, "Presumably there is a stricture of the AMPULLA contributing to the biliary dilatation on today's imaging and recent imaging." I discussed this with Dr. Allen Norris at 4:55 p.m. on 06/11/2018. He noted that currently the patient's bilirubin level is not elevated; based on this, most if not all of the currently visible biliary dilatation may be primarily a physiologic response to the patient's cholecystectomy. Electronically Signed   By: Van Clines M.D.   On: 06/11/2018 17:00   Result Date: 06/11/2018 CLINICAL DATA:  Abdominal pain, nausea, vomiting, and diarrhea. Chemotherapy last taken on Thursday for metastatic cervical cancer. Epigastric pain in the right upper quadrant. Dilated bile ducts on recent CT. Recent colitis. EXAM: MRI ABDOMEN WITHOUT AND WITH CONTRAST (INCLUDING MRCP) TECHNIQUE: Multiplanar multisequence MR imaging of the abdomen was performed both before and after the administration of intravenous contrast. Heavily T2-weighted images of the biliary and pancreatic ducts were  obtained, and three-dimensional MRCP images were rendered by post processing. CONTRAST:  82m MULTIHANCE GADOBENATE DIMEGLUMINE 529 MG/ML IV SOLN COMPARISON:  Multiple exams, including CT  scan from 06/10/2018 FINDINGS: Lower chest: Scattered pulmonary nodules in the lung bases similar to recent CT scan. As before, some of these are cavitary. Hepatobiliary: Mild intrahepatic biliary dilatation. Common bile duct 1.3 cm in diameter, with conical distal tapering at the ampulla but without appreciable filling defect. Dilated cystic duct remnant. No abnormal enhancement along the walls of the biliary tree or in the ampulla no appreciable hepatic metastatic disease. Pancreas: Unremarkable. The dorsal pancreatic duct is not dilated. No appreciable metastatic lesion to the pancreas. Spleen:  Unremarkable Adrenals/Urinary Tract:  Unremarkable Stomach/Bowel: Air-fluid levels in the distal colon compatible with diarrheal process. No significant degree of colon wall thickening where visualized. Vascular/Lymphatic: 1.3 cm porta hepatis node on image 16/7, stable. On the same image there is a separate 1.1 cm node on image 16/7 which is likewise stable. Small periaortic lymph nodes are not pathologically enlarged. Other:  No supplemental non-categorized findings. Musculoskeletal: Lumbar scoliosis with degenerative disc disease and degenerative endplate findings as well as spondylosis. IMPRESSION: 1. Although there is stable porta hepatis adenopathy, no mass or specific lesion is identified in the vicinity of the ampulla to explain the intrahepatic and extrahepatic biliary dilatation. No filling defect in the common bile duct or abnormal enhancement along the wall of the common bile duct. Presumably there is a stricture of the cystic duct contributing to the biliary dilatation on today's imaging and recent imaging. 2. Scattered air-fluid levels in the distal colon, indicating diarrheal process. 3. Stable scattered bibasilar pulmonary nodules compatible with metastatic disease. Some of these nodules are cavitary. 4. Notable degree of lumbar scoliosis, spondylosis, and degenerative disc disease. Electronically Signed:  By: WVan ClinesM.D. On: 06/11/2018 07:45   UKoreaAbdomen Limited Ruq  Result Date: 06/10/2018 CLINICAL DATA:  Vomiting for the past 4 days. New intrahepatic and extrahepatic biliary ductal dilatation on an abdomen pelvis CT earlier today. Previous cholecystectomy. EXAM: ULTRASOUND ABDOMEN LIMITED RIGHT UPPER QUADRANT COMPARISON:  Abdomen and pelvis CT obtained earlier today. FINDINGS: Gallbladder: Surgically absent. Common bile duct: Diameter: 12.2 mm Liver: Mildly dilated intrahepatic ducts. No mass seen. Normal echotexture. Portal vein is patent on color Doppler imaging with normal direction of blood flow towards the liver. IMPRESSION: Mild intrahepatic and extrahepatic biliary ductal dilatation, status post cholecystectomy. The degree of dilatation is greater than expected following cholecystectomy and is concerning for the possibility of a nonvisualized distal common duct stone or stricture. This could be further evaluated with ERCP or MRCP. Electronically Signed   By: SClaudie ReveringM.D.   On: 06/10/2018 16:05    Assessment and plan- Patient is a 52y.o. female who presents for worsening diarrhea and abdominal pain. Recently treated for colitis. History of diarrhea since hemicolectomy.  1.  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  Began carbo/Taxol on 06/04/2018.  Treatment delayed due to diarrhea/colitis.  Scheduled for cycle 2 carbo/Taxol on 06/25/2018 with labs and MD assessment.  2.  Chronic diarrhea: Acutely worse.  Status post right hemicolectomy.  Previously on Questran BID and recently increased to  3 times a day with the addition of Lomotil every 6 for management of diarrhea (05/30/18).  Had complete resolution of diarrhea at last visit with Dr. CMike Gipon 06/04/2018.  Admitted to the hospital from 06/10/18-06/12/18 for similar complaints.  Found to have dilatation of common bile  duct where MRCP was performed.  LFTs and bilirubin were normal. No further intervention was  needed. Started on Colestid by hospitalist at discharge for diarrhea and instructed to stop [place]. She was to continue Lomotil.    Given worsening diarrhea we will collect stool sample to rule out infection. Results: Negative for C. Difficile or other pathogen.  Will get stat abdominal x-ray. Results: No acute abnormality.  She was instructed to resume Questran 3 times a day along with Lomotil given this worked well in the past.  Instructed to stop Colestid.   3.  Hypomagnesemia: Chronic problem. Magnesium level 1.5.  We will give 2 g magnesium with IV fluids today. Labs will be re-checked on Monday.  4.  Chronic abdominal pain: Will refill tramadol prescription.  No acute abnormalities on abdominal x-ray.  5.  Nausea: Refill Phenergan and Zofran. Will given 8 mg Zofran and 10 mg Decadron with fluids today.   6.  Rectal irritation: OTC Boudreau's butt cream.   7.  Chills/Fever??: Instructed her to keep a close eye on temperature over the weekend.  She is scheduled to return to clinic on Monday on 06/25/2018 for cycle 2 carbo/Taxol.   Visit Diagnosis 1. Hypomagnesemia   2. Abdominal pain, unspecified abdominal location   3. Diarrhea, unspecified type   4. Malignant neoplasm of overlapping sites of cervix ([place])   5. Dehydration   6. Nausea and vomiting, intractability of vomiting not specified, unspecified vomiting type     Patient expressed understanding and was in agreement with this plan. She also understands that She can call clinic at any time with any questions, concerns, or complaints.   Greater than 50% was spent in counseling and coordination of care with this patient including but not limited to discussion of the relevant topics above (See A&P) including, but not limited to diagnosis and management of acute and chronic medical conditions.    Faythe Casa, AGNP-C [facility] at [place]- [phone] Pager- [phone] 06/25/2018 2:35 PM

## 2018-06-22 ENCOUNTER — Inpatient Hospital Stay
Admission: RE | Admit: 2018-06-22 | Discharge: 2018-06-22 | Disposition: A | Discharge status: Home / Self Care | Payer: Self-pay | Source: Ambulatory Visit

## 2018-06-22 DIAGNOSIS — Z5111 Encounter for antineoplastic chemotherapy: Secondary | ICD-10-CM | POA: Diagnosis not present

## 2018-06-22 LAB — GASTROINTESTINAL PANEL BY PCR, STOOL (REPLACES STOOL CULTURE)
Adenovirus F40/41: NOT DETECTED
Astrovirus: NOT DETECTED
CAMPYLOBACTER SPECIES: NOT DETECTED
CRYPTOSPORIDIUM: NOT DETECTED
CYCLOSPORA CAYETANENSIS: NOT DETECTED
Entamoeba histolytica: NOT DETECTED
Enteroaggregative E coli (EAEC): NOT DETECTED
Enteropathogenic E coli (EPEC): NOT DETECTED
Enterotoxigenic E coli (ETEC): NOT DETECTED
Giardia lamblia: NOT DETECTED
Norovirus GI/GII: NOT DETECTED
PLESIMONAS SHIGELLOIDES: NOT DETECTED
ROTAVIRUS A: NOT DETECTED
SAPOVIRUS (I, II, IV, AND V): NOT DETECTED
SHIGA LIKE TOXIN PRODUCING E COLI (STEC): NOT DETECTED
SHIGELLA/ENTEROINVASIVE E COLI (EIEC): NOT DETECTED
Salmonella species: NOT DETECTED
VIBRIO SPECIES: NOT DETECTED
Vibrio cholerae: NOT DETECTED
YERSINIA ENTEROCOLITICA: NOT DETECTED

## 2018-06-22 LAB — C DIFFICILE QUICK SCREEN W PCR REFLEX
C DIFFICILE (CDIFF) INTERP: NOT DETECTED
C Diff antigen: NEGATIVE
C Diff toxin: NEGATIVE

## 2018-06-23 NOTE — Discharge Summary (Signed)
[place] [facility] [place] NAME: Joanna Hall    MR#:  [id number]  DATE OF BIRTH:  18-Nov-1965  DATE OF ADMISSION:  06/10/2018 ADMITTING PHYSICIAN: Dustin Flock, MD  DATE OF DISCHARGE: 06/12/2018  1:14 PM  PRIMARY CARE PHYSICIAN: Arnetha Courser, MD   ADMISSION DIAGNOSIS:  Vomiting [R11.10] Right lower quadrant pain [R10.31]  DISCHARGE DIAGNOSIS:  Active Problems:   Abdominal pain   Vomiting   SECONDARY DIAGNOSIS:   Past Medical History:  Diagnosis Date  . Abnormal cervical Papanicolaou smear 09/18/2017  . Anxiety   . Aortic atherosclerosis ([place])   . Arthritis    neck and knees  . 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 Blood transfusion without reported diagnosis   . Cervical cancer (HCC)    mets lung  . Chronic anal fissure   . Chronic diarrhea   . Dyspnea   . Factor V Leiden mutation ([place])   . Fecal incontinence   . Genital warts   . GERD (gastroesophageal reflux disease)   . Heart murmur   . Hemorrhoids   . 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 History of cancer chemotherapy    completed 06/2017  . History of Clostridium difficile infection    while undergoing chemo.  Negative test 10/2017  . 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 Intestinal infection due to Clostridium difficile 09/18/2017  . Macrocytic anemia with vitamin B12 deficiency   . Perianal condylomata   . Pneumonia    History of  . Pulmonary nodules   . Rectal bleeding   . Small bowel obstruction ([place]) 08/2017  . Stiff neck    limited right turn  . Vitamin D deficiency      ADMITTING HISTORY  HISTORY OF PRESENT ILLNESS: Joanna Hall  is a 52 y.o. 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  Patient states that she has had chronic diarrhea and is on regimen per GI.  However since Friday she  started having more diarrhea.  She took chemo on Thursday.  Now she is having epigastric pain localized in the right upper quadrant.  She also has been having nausea vomiting and not able to keep anything down.  In the ER she had a CT scan of the abdomen which showed dilated common bile ducts and intrahepatic ducts which was new for her.  She denies any fevers or chills.  Patient has had colitis and has been on antibiotics recently stool for C. difficile was negative here.  HOSPITAL COURSE:   *Chronic nausea vomiting and diarrhea.  Patient also had abdominal pain.  Due to findings of possible dilated common bile duct on CT scan patient was admitted and had MRCP done after consulting GI.  There was some concern regarding a common bile duct stricture but Dr. Verl Blalock with GI after reviewing the images and with normal liver function test did not advise any further work-up.  Patient was advised to follow-up as outpatient.  No signs of infection.  C. difficile was checked and negative. Started on Imodium and Lomotil and symptoms are much improved.  Other comorbidities remained stable.  Patient discharged home to follow-up with primary care physician and GI.  CONSULTS OBTAINED:  Treatment Team:  Virgel Manifold, MD  DRUG ALLERGIES:  Allergies  Allergen Reactions  . Ketamine Anxiety and Other (See Comments)    Syncope episode/confusion     DISCHARGE MEDICATIONS:   Allergies as of 06/12/2018      Reactions   Ketamine Anxiety, Other (See Comments)   Syncope episode/confusion      Medication List    STOP taking these medications   cholestyramine 4 g packet Commonly known as:  QUESTRAN     TAKE these medications   CALCIUM 500 +D 500-400 MG-UNIT Tabs Generic drug:  Calcium Carb-Cholecalciferol Take 2 tablets by mouth daily.   colestipol 1 g tablet Commonly known as:  COLESTID Take 2 tablets (2 g total) by mouth 2 (two) times daily.   cyanocobalamin 1000 MCG/ML injection Commonly  known as:  (VITAMIN B-12) Inject 1ML every week x 4 weeks, then 1ML every other week x 2 months, then monthly x 3 months What changed:    how much to take  how to take this  when to take this   dicyclomine 20 MG tablet Commonly known as:  BENTYL Take 1 tablet (20 mg total) by mouth every 6 (six) hours.   fluticasone 50 MCG/ACT nasal spray Commonly known as:  FLONASE Place 2 sprays into both nostrils daily.   lidocaine-prilocaine cream Commonly known as:  EMLA Apply 1 application topically as needed. Apply small amount to port site at least 1 hour prior to it being accessed, cover with plastic wrap   loratadine 10 MG tablet Commonly known as:  CLARITIN Take 10 mg by mouth daily.   LORazepam 0.5 MG tablet Commonly known as:  ATIVAN Take 1 tablet (0.5 mg total) by mouth every 6 (six) hours as needed (nausea).   Mag Glycinate 100 MG Tabs Take 100 mg by mouth daily.   magnesium oxide 400 MG tablet Commonly known as:  MAG-OX Take 1 tablet (400 mg total) by mouth daily.   Oxycodone HCl 10 MG Tabs Take 1 tablet (10 mg total) by mouth every 4 (four) hours as needed for severe pain (pain score 7-10).   rivaroxaban 20 MG Tabs tablet Commonly known as:  XARELTO Take 1 tablet (20 mg total) by mouth daily with supper.   sertraline 50 MG tablet Commonly known as:  ZOLOFT Take 1 tablet (50 mg total) by mouth daily.   SYRINGE 3CC/22GX1" 22G X 1" 3 ML Misc For use with Vitamin B12 injections   VENTOLIN HFA 108 (90 Base) MCG/ACT inhaler Generic drug:  albuterol Inhale 1-2 puffs into the lungs every 4 (four) hours as needed for shortness of breath.     ASK your doctor about these medications   diphenoxylate-atropine 2.5-0.025 MG tablet Commonly known as:  LOMOTIL Take 1 tablet by mouth every 6 (six) hours as needed for up to 14 days for diarrhea or loose stools. Ask about: Should I take this medication?       Today   VITAL SIGNS:  Blood pressure 106/71, pulse 71,  temperature 98.1 F (36.7 C), temperature source Oral, resp. rate 13, height 5' 8"  (1.727 m), weight 69.1 kg, SpO2 98 %.  I/O:  No intake or output data in the 24 hours ending 06/23/18 1226  PHYSICAL EXAMINATION:  Physical Exam  GENERAL:  52 y.o.-year-old patient lying in the bed with no acute distress.  LUNGS: Normal breath sounds bilaterally, no wheezing, rales,rhonchi or crepitation. No use of accessory muscles of respiration.  CARDIOVASCULAR: S1, S2 normal. No murmurs, rubs, or gallops.  ABDOMEN: Soft, non-tender, non-distended. Bowel sounds present.  No organomegaly or mass.  NEUROLOGIC: Moves all 4 extremities. PSYCHIATRIC: The patient is alert and oriented x 3.  SKIN: No obvious rash, lesion, or ulcer.   DATA REVIEW:   CBC Recent Labs  Lab 06/21/18 1315  WBC 6.0  HGB 12.4  HCT 36.5  PLT 174    Chemistries  Recent Labs  Lab 06/21/18 1315  NA 137  K 3.7  CL 105  CO2 23  GLUCOSE 83  BUN 6  CREATININE 0.50  CALCIUM 9.6  MG 1.5*  AST 39  ALT 54*  ALKPHOS 148*  BILITOT 0.5    Cardiac Enzymes No results for input(s): TROPONINI in the last 168 hours.  Microbiology Results  Results for orders placed or performed during the hospital encounter of 06/10/18  Urine Culture     Status: Abnormal   Collection Time: 06/10/18 10:07 AM  Result Value Ref Range Status   Specimen Description   Final    URINE, RANDOM Performed at [facility], [address], [place], [place] [address]    Special Requests   Final    NONE Performed at [facility], [place], [place] [address]    Culture >=100,000 COLONIES/mL ESCHERICHIA COLI (A)  Final   Report Status 06/13/2018 FINAL  Final   Organism ID, Bacteria ESCHERICHIA COLI (A)  Final      Susceptibility   Escherichia coli - MIC*    AMPICILLIN >=32 RESISTANT Resistant     CEFAZOLIN <=4 SENSITIVE Sensitive     CEFTRIAXONE <=1 SENSITIVE Sensitive     CIPROFLOXACIN >=4 RESISTANT Resistant      GENTAMICIN <=1 SENSITIVE Sensitive     IMIPENEM <=0.25 SENSITIVE Sensitive     NITROFURANTOIN <=16 SENSITIVE Sensitive     TRIMETH/SULFA >=320 RESISTANT Resistant     AMPICILLIN/SULBACTAM 16 INTERMEDIATE Intermediate     PIP/TAZO <=4 SENSITIVE Sensitive     Extended ESBL NEGATIVE Sensitive     * >=100,000 COLONIES/mL ESCHERICHIA COLI  C difficile quick scan w PCR reflex     Status: None   Collection Time: 06/10/18 12:31 PM  Result Value Ref Range Status   C Diff antigen NEGATIVE NEGATIVE Final   C Diff toxin NEGATIVE NEGATIVE Final   C Diff interpretation No C. difficile detected.  Final    Comment: Performed at [facility], [address] [place], [place] [address]  Gastrointestinal Panel by PCR , Stool     Status: None   Collection Time: 06/11/18  5:29 PM  Result Value Ref Range Status   Campylobacter species NOT DETECTED NOT DETECTED Final   Plesimonas shigelloides NOT DETECTED NOT DETECTED Final   Salmonella species NOT DETECTED NOT DETECTED Final   Yersinia enterocolitica NOT DETECTED NOT DETECTED Final   Vibrio species NOT DETECTED NOT DETECTED Final   Vibrio cholerae NOT DETECTED NOT DETECTED Final   Enteroaggregative E coli (EAEC) NOT DETECTED NOT DETECTED Final   Enteropathogenic E coli (EPEC) NOT DETECTED NOT DETECTED Final   Enterotoxigenic E coli (ETEC) NOT DETECTED NOT DETECTED Final   Shiga like toxin producing E coli (STEC) NOT DETECTED NOT DETECTED Final   Shigella/Enteroinvasive E coli (EIEC) NOT DETECTED NOT DETECTED Final   Cryptosporidium NOT DETECTED NOT DETECTED Final   Cyclospora cayetanensis NOT DETECTED NOT DETECTED Final   Entamoeba histolytica NOT DETECTED NOT DETECTED Final   Giardia lamblia NOT DETECTED NOT DETECTED Final   Adenovirus F40/41 NOT DETECTED NOT DETECTED Final   Astrovirus NOT DETECTED NOT  DETECTED Final   Norovirus GI/GII NOT DETECTED NOT DETECTED Final   Rotavirus A NOT DETECTED NOT DETECTED Final   Sapovirus (I, II, IV,  and V) NOT DETECTED NOT DETECTED Final    Comment: Performed at [facility], [address], [place], [place] [address]    RADIOLOGY:  Dg Abd 2 Views  Result Date: 06/21/2018 CLINICAL DATA:  Patient reports diarrhea and nausea/vomiting x 4 days. Reports hx of colon resection in November. Reports most of her abdominal pain is located in the RLQ. Also states she started chemo treatment 2 weeks ago to treat lung cancer. EXAM: ABDOMEN - 2 VIEW COMPARISON:  06/10/2018 CT FINDINGS: There is paucity of bowel gas, limiting evaluation of bowel caliber. Bowel sutures are identified in the RIGHT mid abdomen. No evidence for free intraperitoneal air or pneumatosis. No evidence for organomegaly. Scoliosis and associated degenerative changes are seen in the spine. Scattered phleboliths are present in the pelvis. IMPRESSION: 1. Paucity of bowel gas. 2. No acute abnormality. Electronically Signed   By: Nolon Nations M.D.   On: 06/21/2018 12:55    Follow up with PCP in 1 week.  Management plans discussed with the patient, family and they are in agreement.  CODE STATUS:  Code Status History    Date Active Date Inactive Code Status Order ID Comments User Context   06/10/2018 1754 06/12/2018 1614 Full Code [id number]  Dustin Flock, MD Inpatient   04/24/2018 1451 04/27/2018 1700 Full Code [id number]  Lovell Sheehan, MD Inpatient   12/03/2017 1619 12/08/2017 1606 Full Code [id number]  Epifanio Lesches, MD ED   09/12/2017 0024 09/15/2017 1721 Full Code [id number]  [name], MD ED   08/27/2017 0223 09/05/2017 1355 Full Code [id number]  Herbert Pun, MD Inpatient      TOTAL TIME TAKING CARE OF THIS PATIENT ON DAY OF DISCHARGE: more than 30 minutes.   Leia Alf Saagar Tortorella M.D on 06/23/2018 at 12:26 PM  Between 7am to 6pm - Pager - [phone]  After 6pm go to [email or web address] - password [email or web address] [place] [facility]  Office  [phone]  CC: Primary care physician; Arnetha Courser,  MD  Note: This dictation was prepared with Dragon dictation along with smaller phrase technology. Any transcriptional errors that result from this process are unintentional.

## 2018-06-25 ENCOUNTER — Encounter: Payer: Self-pay | Admitting: Hematology and Oncology

## 2018-06-25 ENCOUNTER — Inpatient Hospital Stay: Payer: Medicaid Other

## 2018-06-25 ENCOUNTER — Other Ambulatory Visit: Payer: Self-pay | Admitting: Hematology and Oncology

## 2018-06-25 ENCOUNTER — Inpatient Hospital Stay (HOSPITAL_BASED_OUTPATIENT_CLINIC_OR_DEPARTMENT_OTHER): Payer: Medicaid Other | Admitting: Hematology and Oncology

## 2018-06-25 VITALS — BP 107/73 | HR 85 | Temp 96.3°F | Resp 18 | Wt 166.4 lb

## 2018-06-25 DIAGNOSIS — R7989 Other specified abnormal findings of blood chemistry: Secondary | ICD-10-CM

## 2018-06-25 DIAGNOSIS — C7800 Secondary malignant neoplasm of unspecified lung: Secondary | ICD-10-CM

## 2018-06-25 DIAGNOSIS — C538 Malignant neoplasm of overlapping sites of cervix uteri: Secondary | ICD-10-CM | POA: Diagnosis not present

## 2018-06-25 DIAGNOSIS — E871 Hypo-osmolality and hyponatremia: Secondary | ICD-10-CM | POA: Diagnosis not present

## 2018-06-25 DIAGNOSIS — Z5111 Encounter for antineoplastic chemotherapy: Secondary | ICD-10-CM

## 2018-06-25 DIAGNOSIS — Z923 Personal history of irradiation: Secondary | ICD-10-CM

## 2018-06-25 DIAGNOSIS — E860 Dehydration: Secondary | ICD-10-CM

## 2018-06-25 DIAGNOSIS — E538 Deficiency of other specified B group vitamins: Secondary | ICD-10-CM

## 2018-06-25 DIAGNOSIS — R1033 Periumbilical pain: Secondary | ICD-10-CM

## 2018-06-25 DIAGNOSIS — E893 Postprocedural hypopituitarism: Secondary | ICD-10-CM

## 2018-06-25 DIAGNOSIS — Z7901 Long term (current) use of anticoagulants: Secondary | ICD-10-CM

## 2018-06-25 DIAGNOSIS — D6851 Activated protein C resistance: Secondary | ICD-10-CM

## 2018-06-25 DIAGNOSIS — K529 Noninfective gastroenteritis and colitis, unspecified: Secondary | ICD-10-CM

## 2018-06-25 DIAGNOSIS — Z79899 Other long term (current) drug therapy: Secondary | ICD-10-CM

## 2018-06-25 DIAGNOSIS — C7802 Secondary malignant neoplasm of left lung: Secondary | ICD-10-CM

## 2018-06-25 DIAGNOSIS — R110 Nausea: Secondary | ICD-10-CM

## 2018-06-25 DIAGNOSIS — R197 Diarrhea, unspecified: Secondary | ICD-10-CM

## 2018-06-25 LAB — CBC WITH DIFFERENTIAL/PLATELET
Basophils Absolute: 0 10*3/uL (ref 0–0.1)
Basophils Relative: 0 %
Eosinophils Absolute: 0 10*3/uL (ref 0–0.7)
Eosinophils Relative: 0 %
HCT: 33.9 % — ABNORMAL LOW (ref 35.0–47.0)
Hemoglobin: 11.5 g/dL — ABNORMAL LOW (ref 12.0–16.0)
Lymphocytes Relative: 21 %
Lymphs Abs: 1 10*3/uL (ref 1.0–3.6)
MCH: 33.3 pg (ref 26.0–34.0)
MCHC: 33.9 g/dL (ref 32.0–36.0)
MCV: 98.1 fL (ref 80.0–100.0)
Monocytes Absolute: 0.3 10*3/uL (ref 0.2–0.9)
Monocytes Relative: 7 %
Neutro Abs: 3.2 10*3/uL (ref 1.4–6.5)
Neutrophils Relative %: 72 %
Platelets: 170 10*3/uL (ref 150–440)
RBC: 3.45 MIL/uL — ABNORMAL LOW (ref 3.80–5.20)
RDW: 13.6 % (ref 11.5–14.5)
WBC: 4.5 10*3/uL (ref 3.6–11.0)

## 2018-06-25 LAB — COMPREHENSIVE METABOLIC PANEL
ALT: 46 U/L — ABNORMAL HIGH (ref 0–44)
AST: 33 U/L (ref 15–41)
Albumin: 3.9 g/dL (ref 3.5–5.0)
Alkaline Phosphatase: 133 U/L — ABNORMAL HIGH (ref 38–126)
Anion gap: 9 (ref 5–15)
BUN: 15 mg/dL (ref 6–20)
CO2: 25 mmol/L (ref 22–32)
Calcium: 9.5 mg/dL (ref 8.9–10.3)
Chloride: 101 mmol/L (ref 98–111)
Creatinine, Ser: 0.6 mg/dL (ref 0.44–1.00)
GFR calc Af Amer: >60 mL/min (ref >60)
GFR calc non Af Amer: >60 mL/min (ref >60)
Glucose, Bld: 87 mg/dL (ref 70–99)
Potassium: 4.2 mmol/L (ref 3.5–5.1)
Sodium: 135 mmol/L (ref 135–145)
Total Bilirubin: 0.6 mg/dL (ref 0.3–1.2)
Total Protein: 7.2 g/dL (ref 6.5–8.1)

## 2018-06-25 LAB — MAGNESIUM: Magnesium: 1.8 mg/dL (ref 1.7–2.4)

## 2018-06-25 MED ORDER — DIPHENHYDRAMINE HCL 50 MG/ML IJ SOLN
50.0000 mg | Freq: Once | INTRAMUSCULAR | Status: AC
  Administered 2018-06-25: 50 mg via INTRAVENOUS
  Filled 2018-06-25: qty 1

## 2018-06-25 MED ORDER — PALONOSETRON HCL INJECTION 0.25 MG/5ML
0.2500 mg | Freq: Once | INTRAVENOUS | Status: AC
  Administered 2018-06-25: 0.25 mg via INTRAVENOUS
  Filled 2018-06-25: qty 5

## 2018-06-25 MED ORDER — SODIUM CHLORIDE 0.9 % IV SOLN
559.3500 mg | Freq: Once | INTRAVENOUS | Status: AC
  Administered 2018-06-25: 560 mg via INTRAVENOUS
  Filled 2018-06-25: qty 56

## 2018-06-25 MED ORDER — FAMOTIDINE IN NACL 20-0.9 MG/50ML-% IV SOLN
20.0000 mg | Freq: Once | INTRAVENOUS | Status: AC
  Administered 2018-06-25: 20 mg via INTRAVENOUS
  Filled 2018-06-25: qty 50

## 2018-06-25 MED ORDER — HEPARIN SOD (PORK) LOCK FLUSH 100 UNIT/ML IV SOLN
500.0000 [IU] | Freq: Once | INTRAVENOUS | Status: AC | PRN
  Administered 2018-06-25: 500 [IU]

## 2018-06-25 MED ORDER — SODIUM CHLORIDE 0.9 % IV SOLN
155.0000 mg/m2 | Freq: Once | INTRAVENOUS | Status: AC
  Administered 2018-06-25: 294 mg via INTRAVENOUS
  Filled 2018-06-25: qty 49

## 2018-06-25 MED ORDER — HEPARIN SOD (PORK) LOCK FLUSH 100 UNIT/ML IV SOLN
INTRAVENOUS | Status: AC
  Filled 2018-06-25: qty 5

## 2018-06-25 MED ORDER — SODIUM CHLORIDE 0.9 % IV SOLN
20.0000 mg | Freq: Once | INTRAVENOUS | Status: AC
  Administered 2018-06-25: 20 mg via INTRAVENOUS
  Filled 2018-06-25: qty 2

## 2018-06-25 MED ORDER — SODIUM CHLORIDE 0.9 % IV SOLN
Freq: Once | INTRAVENOUS | Status: AC
  Administered 2018-06-25: 11:00:00 via INTRAVENOUS
  Filled 2018-06-25: qty 250

## 2018-06-25 NOTE — Progress Notes (Signed)
Patient offers no complaints today. 

## 2018-06-25 NOTE — Progress Notes (Signed)
[facility] day:  06/25/2018   Chief Complaint: Joanna Hall is a 52 y.o. female with a history of cervical cancer s/p chemoradiation who is seen for assessment prior to cycle #2 carboplatin and Taxol.  HPI:  The patient was last seen in the medical oncology clinic on 06/04/2018.  At that time, she was doing well. Diarrhea had resolved following treatment with ABX for colitis. She was on Questran daily. Exam was grossly unremarkable.  WBC was 3500 ([place] 2300). Platelets were 174,000. Potassium was 3.8. LFTs were mildly elevated (AST 32, ALT 32, and ALP 173).  She received cycle #1 carboplatin and Taxol with Udencya support.  She was admitted to [facility] from 06/10/2018 - 06/12/2018 with abdominal pain, nausea, vomiting, and diarrhea.  Diarrhea was unresponsive to outpatient Questran and Lomotil. Abdominal CT revealed dilated common bile duct and intrahepatic ducts.  MRCP revealed no stones or CBD obstruction.  She was seen by Faythe Casa, NP on 06/21/2018 for diarrhea and abdominal pain.  Pain localized to the RLQ. Abdominal films were negative.  She received 2 gm of magnesium for Mg of 1.5.  GI panel and C diff panel were negative.  She started back on Questran/lomotil.  She stopped Colestid.  Symptomatically, patient continues to have issues with her bowels. Patient requires the use of Questran and Lomotil. She has chronic issues with her bowels secondary to a previous partial colectomy. Prior to initiation of chemotherapy, patient was controlling her bowels with Questran and loperamide. Patient experiences nausea, which is adequately controlled with ondansetron and promethazine. She denies and fevers or increased diaphoresis.   Patient complains of diffuse body aches. She states, "my legs are just really sore". She denies fevers. Myalgias are not attributed with Udencya.   Patient advises that she maintains an adequate appetite. She is eating well.  Weight today is 166 lb 7 oz (75.5 kg), which compared to her last visit to the clinic, represents a 3 pound weight loss. Patient receives parenteral B12 supplementation at home. Her next injection is due today.   Patient denies pain in the clinic today.   Past Medical History:  Diagnosis Date  . Abnormal cervical Papanicolaou smear 09/18/2017  . Anxiety   . Aortic atherosclerosis ([place])   . Arthritis    neck and knees  . 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 Blood transfusion without reported diagnosis   . Cervical cancer (HCC)    mets lung  . Chronic anal fissure   . Chronic diarrhea   . Dyspnea   . Factor V Leiden mutation ([place])   . Fecal incontinence   . Genital warts   . GERD (gastroesophageal reflux disease)   . Heart murmur   . Hemorrhoids   . 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 History of cancer chemotherapy    completed 06/2017  . History of Clostridium difficile infection    while undergoing chemo.  Negative test 10/2017  . 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 Intestinal infection due to Clostridium difficile 09/18/2017  . Macrocytic anemia with vitamin B12 deficiency   . Perianal condylomata   . Pneumonia    History of  . Pulmonary nodules   . Rectal bleeding   . Small bowel obstruction ([place]) 08/2017  . Stiff neck    limited right turn  . Vitamin D  deficiency     Past Surgical History:  Procedure Laterality Date  . CHOLECYSTECTOMY    . COLON SURGERY  08/2017   resection  . COLONOSCOPY WITH PROPOFOL N/A 12/20/2017   Procedure: COLONOSCOPY WITH PROPOFOL;  Surgeon: [name], MD;  Location: [facility] ENDOSCOPY;  Service: Gastroenterology;  Laterality: N/A;  . DIAGNOSTIC LAPAROSCOPY    . ESOPHAGOGASTRODUODENOSCOPY (EGD) WITH PROPOFOL N/A 12/20/2017   Procedure: ESOPHAGOGASTRODUODENOSCOPY (EGD) WITH PROPOFOL;  Surgeon: [name], MD;  Location: [place];  Service:  Gastroenterology;  Laterality: N/A;  . LAPAROTOMY N/A 08/31/2017   Procedure: EXPLORATORY LAPAROTOMY for SBO, ileocolectomy, removal of piece of uterine wall;  Surgeon: [name], MD;  Location: [facility] ORS;  Service: General;  Laterality: N/A;  . LASER ABLATION CONDOLAMATA N/A 02/22/2018   Procedure: LASER ABLATION/REMOVAL OF FKCLEXNTZGY AROUND ANUS AND VAGINA;  Surgeon: Michael Boston, MD;  Location: [place];  Service: General;  Laterality: N/A;  . OOPHORECTOMY    . PORTA CATH INSERTION N/A 05/13/2018   Procedure: PORTA CATH INSERTION;  Surgeon: Katha Cabal, MD;  Location: [facility];  Service: Cardiovascular;  Laterality: N/A;  . SMALL INTESTINE SURGERY    . TANDEM RING INSERTION     x3  . THORACOTOMY    . TOTAL KNEE ARTHROPLASTY Left 04/24/2018   Procedure: TOTAL KNEE ARTHROPLASTY;  Surgeon: Lovell Sheehan, MD;  Location: [facility] ORS;  Service: Orthopedics;  Laterality: Left;    Family History  Problem Relation Age of Onset  . Hypertension Father   . Diabetes Father   . Alcohol abuse Daughter   . Hypertension Maternal Grandmother   . Diabetes Maternal Grandmother   . Diabetes Paternal Grandmother   . Hypertension Paternal Grandmother     Social History:  reports that she quit smoking about 11 years ago. She has never used smokeless tobacco. She reports that she drinks alcohol. She reports that she has current or past drug history. Drug: Marijuana.  The patient's 67 year old daughter died of alcoholism.  The patient is originally from [place].  She moved from [place] to [place]. She is alone today.  Allergies:  Allergies  Allergen Reactions  . Ketamine Anxiety and Other (See Comments)    Syncope episode/confusion     Current Medications: Current Outpatient Medications  Medication Sig Dispense Refill  . Calcium Carb-Cholecalciferol (CALCIUM 500 +D) 500-400 MG-UNIT TABS Take 2 tablets by mouth daily.    . colestipol  (COLESTID) 1 g tablet Take 2 tablets (2 g total) by mouth 2 (two) times daily. 60 tablet 0  . cyanocobalamin (,VITAMIN B-12,) 1000 MCG/ML injection Inject 1ML every week x 4 weeks, then 1ML every other week x 2 months, then monthly x 3 months (Patient taking differently: Inject 1,000 mcg into the muscle every 30 (thirty) days. Inject 1ML every week x 4 weeks, then 1ML every other week x 2 months, then monthly x 3 months) 10 mL 1  . dicyclomine (BENTYL) 20 MG tablet Take 1 tablet (20 mg total) by mouth every 6 (six) hours. 20 tablet 0  . fluticasone (FLONASE) 50 MCG/ACT nasal spray Place 2 sprays into both nostrils daily. 16 g 0  . lidocaine-prilocaine (EMLA) cream Apply 1 application topically as needed. Apply small amount to port site at least 1 hour prior to it being accessed, cover with plastic wrap 30 g 3  . loratadine (CLARITIN) 10 MG tablet Take 10 mg by mouth daily.    Marland Kitchen LORazepam (  ATIVAN) 0.5 MG tablet Take 1 tablet (0.5 mg total) by mouth every 6 (six) hours as needed (nausea). 30 tablet 0  . Magnesium Bisglycinate (MAG GLYCINATE) 100 MG TABS Take 100 mg by mouth daily. 30 tablet 3  . magnesium oxide (MAG-OX) 400 MG tablet Take 1 tablet (400 mg total) by mouth daily. 30 tablet 0  . ondansetron (ZOFRAN) 8 MG tablet Take 1 tablet (8 mg total) by mouth every 8 (eight) hours as needed for nausea or vomiting. 30 tablet 0  . oxyCODONE 10 MG TABS Take 1 tablet (10 mg total) by mouth every 4 (four) hours as needed for severe pain (pain score 7-10). 30 tablet 0  . promethazine (PHENERGAN) 25 MG tablet Take 1 tablet (25 mg total) by mouth every 6 (six) hours as needed for nausea or vomiting. 30 tablet 0  . rivaroxaban (XARELTO) 20 MG TABS tablet Take 1 tablet (20 mg total) by mouth daily with supper. 30 tablet 1  . sertraline (ZOLOFT) 50 MG tablet Take 1 tablet (50 mg total) by mouth daily. 30 tablet 3  . Syringe/Needle, Disp, (SYRINGE 3CC/22GX1") 22G X 1" 3 ML MISC For use with Vitamin B12 injections  50 each 0  . traMADol (ULTRAM) 50 MG tablet Take 1 tablet (50 mg total) by mouth every 6 (six) hours as needed. 60 tablet 0  . VENTOLIN HFA 108 (90 Base) MCG/ACT inhaler Inhale 1-2 puffs into the lungs every 4 (four) hours as needed for shortness of breath.   0   No current facility-administered medications for this visit.    Facility-Administered Medications Ordered in Other Visits  Medication Dose Route Frequency Provider Last Rate Last Dose  . heparin lock flush 100 unit/mL  500 Units Intravenous Once Corcoran, Melissa C, MD      . sodium chloride flush (NS) 0.9 % injection 10 mL  10 mL Intravenous PRN Jacquelin Hawking, NP   10 mL at 05/23/18 1225  . sodium chloride flush (NS) 0.9 % injection 10 mL  10 mL Intravenous PRN Lequita Asal, MD   10 mL at 06/10/18 0850    Review of Systems  Constitutional: Positive for weight loss (3 pounds). Negative for chills, diaphoresis, fever and malaise/fatigue.       Feels "pretty good today".  HENT: Negative.  Negative for congestion, ear discharge, ear pain, hearing loss, nosebleeds, sinus pain, sore throat and tinnitus.   Eyes: Negative.  Negative for blurred vision, double vision, photophobia, pain, discharge and redness.  Respiratory: Positive for shortness of breath (exertional). Negative for cough, hemoptysis and sputum production.   Cardiovascular: Negative.  Negative for chest pain, palpitations, orthopnea, claudication and leg swelling.  Gastrointestinal: Positive for diarrhea (loose stool in AM; none rest of day). Negative for abdominal pain, blood in stool, constipation, heartburn, melena, nausea and vomiting.       Eating and drinking "ok".  Genitourinary: Negative.  Negative for dysuria, frequency, hematuria and urgency.  Musculoskeletal: Positive for joint pain (s/p left TKR), myalgias (aches in legs) and neck pain (OA shoulders and neck). Negative for back pain.  Skin: Negative for itching and rash.       Hair loss.   Neurological: Negative for dizziness, tingling, tremors, sensory change, speech change, focal weakness, seizures, weakness and headaches.  Endo/Heme/Allergies: Negative for environmental allergies. Does not bruise/bleed easily.  Psychiatric/Behavioral: Negative for depression and hallucinations. The patient is not nervous/anxious and does not have insomnia.   All other systems reviewed and are negative.  Performance status (ECOG): 1-2  Vital Signs BP 107/73 (BP Location: Left Arm, Patient Position: Sitting)   Pulse 85   Temp (!) 96.3 F (35.7 C)   Resp 18   Wt 166 lb 7 oz (75.5 kg)   BMI 25.31 kg/m   Physical Exam  Constitutional: She is oriented to person, place, and time and well-developed, well-nourished, and in no distress.  Cane at side for ambulation s/p TKR  HENT:  Head: Normocephalic and atraumatic. Hair is abnormal (alopecia).  Wearing a pink wrap  Eyes: Pupils are equal, round, and reactive to light. EOM are normal. No scleral icterus.  Blue eyes  Neck: Normal range of motion. Neck supple. No tracheal deviation present. No thyromegaly present.  Cardiovascular: Normal rate, regular rhythm and normal heart sounds. Exam reveals no gallop and no friction rub.  No murmur heard. Pulmonary/Chest: Effort normal and breath sounds normal. No respiratory distress. She has no wheezes. She has no rales.  Abdominal: Soft. Bowel sounds are normal. She exhibits no distension. There is no tenderness.  Musculoskeletal: Normal range of motion. She exhibits no edema or tenderness.  Lymphadenopathy:       Head (right side): No submandibular, no preauricular, no posterior auricular and no occipital adenopathy present.       Head (left side): No submandibular, no preauricular, no posterior auricular and no occipital adenopathy present.    She has no cervical adenopathy.    She has no axillary adenopathy.       Right: No inguinal and no supraclavicular adenopathy present.       Left: No  inguinal and no supraclavicular adenopathy present.  Neurological: She is alert and oriented to person, place, and time.  Skin: Skin is warm and dry. No rash noted. No erythema.  Psychiatric: Mood, affect and judgment normal.  Nursing note and vitals reviewed.   Imaging studies: 06/2017:  PET scan revealed enlarging pulmonary nodules but no evidence of abdominal disease per patient report. 09/13/2017:  Abdomen and pelvic CT revealed a small infarct in the inferior pole of the LEFT kidney and RIGHT uterine infarct. 11/08/2017:  PET scan at the [place] [facility] revealed innumerable stable bilateral cavitary pulmonary nodules (up to 8 mm), some of which demonstrate low level metabolic activity.  There were post treatment changes in the pelvis without evidence of suspicious hypermetabolic activity. 12/03/2017:  Abdomen and pelvic CT revealed continued multiple pulmonary nodules in the lung bases concerning for metastatic disease.  There was mild wall thickening of rectosigmoid colon is noted.  There was a stable 1.8 x 1.6 cm mixed fat and soft tissue density noted in right lower quadrant which may represent fat necrosis.  There was a larger low density is noted involving uterine fundus. 12/03/2017:  Pelvic MRI was unremarkable.   04/15/2018:  Chest, abdomen, and pelvis CT revealed innumerable (> 100) cavitary nodules scattered in the lungs, moderately enlarging compared to the 11/08/2017 PET-CT, suspicious for metastatic disease.  One index node in the RLL measures 1.0 x 1.1 cm (previously 0.6 x 0.6 cm).  There were no new nodules.  There was an ill-defined wall thickening in the rectosigmoid with surrounding stranding along fascia planes, probably sequela from prior radiation therapy.  There was multilevel lumbar impingement due to spondylosis and degenerative disc disease.  There was heterogeneous enhancement in the uterus (some possibly from prior radiation therapy). 04/23/2018:  PET  scan revealed numerous scattered solid and cavitary nodules in the lungs stable increased in size  compared to the prior PET-CT from 11/08/2017. Largest nodule was 1.1 cm in the LUL (SUV 1.9).  These demonstrated low-grade metabolic activity up to a maximum SUV of about 2.3, increased from 11/08/2017.    Infusion on 06/25/2018  Component Date Value Ref Range Status  . Magnesium 06/25/2018 1.8  1.7 - 2.4 mg/dL Final   Performed at [facility], [address], [place], [place] [address]  . Sodium 06/25/2018 135  135 - 145 mmol/L Final  . Potassium 06/25/2018 4.2  3.5 - 5.1 mmol/L Final  . Chloride 06/25/2018 101  98 - 111 mmol/L Final  . CO2 06/25/2018 25  22 - 32 mmol/L Final  . Glucose, Bld 06/25/2018 87  70 - 99 mg/dL Final  . BUN 06/25/2018 15  6 - 20 mg/dL Final  . Creatinine, Ser 06/25/2018 0.60  0.44 - 1.00 mg/dL Final  . Calcium 06/25/2018 9.5  8.9 - 10.3 mg/dL Final  . Total Protein 06/25/2018 7.2  6.5 - 8.1 g/dL Final  . Albumin 06/25/2018 3.9  3.5 - 5.0 g/dL Final  . AST 06/25/2018 33  15 - 41 U/L Final  . ALT 06/25/2018 46* 0 - 44 U/L Final  . Alkaline Phosphatase 06/25/2018 133* 38 - 126 U/L Final  . Total Bilirubin 06/25/2018 0.6  0.3 - 1.2 mg/dL Final  . GFR calc non Af Amer 06/25/2018 >60  >60 mL/min Final  . GFR calc Af Amer 06/25/2018 >60  >60 mL/min Final   Comment: (NOTE) The eGFR has been calculated using the CKD EPI equation. This calculation has not been validated in all clinical situations. eGFR's persistently <60 mL/min signify possible Chronic Kidney Disease.   Georgiann Hahn gap 06/25/2018 9  5 - 15 Final   Performed at [facility], [address] [place], [place] [address]  . WBC 06/25/2018 4.5  3.6 - 11.0 K/uL Final  . RBC 06/25/2018 3.45* 3.80 - 5.20 MIL/uL Final  . Hemoglobin 06/25/2018 11.5* 12.0 - 16.0 g/dL Final  . HCT 06/25/2018 33.9* 35.0 - 47.0 % Final  . MCV 06/25/2018 98.1  80.0 - 100.0 fL Final  . MCH 06/25/2018 33.3  26.0 - 34.0 pg  Final  . MCHC 06/25/2018 33.9  32.0 - 36.0 g/dL Final  . RDW 06/25/2018 13.6  11.5 - 14.5 % Final  . Platelets 06/25/2018 170  150 - 440 K/uL Final  . Neutrophils Relative % 06/25/2018 72  % Final  . Neutro Abs 06/25/2018 3.2  1.4 - 6.5 K/uL Final  . Lymphocytes Relative 06/25/2018 21  % Final  . Lymphs Abs 06/25/2018 1.0  1.0 - 3.6 K/uL Final  . Monocytes Relative 06/25/2018 7  % Final  . Monocytes Absolute 06/25/2018 0.3  0.2 - 0.9 K/uL Final  . Eosinophils Relative 06/25/2018 0  % Final  . Eosinophils Absolute 06/25/2018 0.0  0 - 0.7 K/uL Final  . Basophils Relative 06/25/2018 0  % Final  . Basophils Absolute 06/25/2018 0.0  0 - 0.1 K/uL Final   Performed at [facility], [address] [place], [place] [address]    Assessment:  Joanna Hall is a 52 y.o. female with a history of stage IB1 cervical cancer s/p concurrent cisplatin and radiation (external beam and brachytherapy) from 11/2016 - 02/2017.    She was treated by Dr. Christene Slates at [facility] in [place], [place].  In 01/2017, she received cisplatin x 2 and carboplatin x 1 (01/29/2017) due to ARF and  XRT. Radiation was followed by T & O on 02/01/2017 and T & N on 02/10/2017 and 02/20/2017.  Course was complicated by weight loss (80 pounds), nausea, vomiting, electrolyte wasting (potassium and magnesium), and multiple hospitalizations.  PET scan in 06/2017 revealed enlarging pulmonary nodules but no evidence of abdominal disease per patient report.  She is s/p right ileocolectomy on 08/31/2017 for a small bowel obstruction.  She presented with abdominal pain.  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  Chest, abdomen, and pelvis CT on 04/15/2018 revealed innumerable (> 100) cavitary nodules scattered in the lungs, moderately enlarging compared to the 11/08/2017 PET-CT, suspicious for metastatic  disease. One index node in the RLL measures 1.0 x 1.1 cm.  There were no new nodules.  There was an ill-defined wall thickening in the rectosigmoid with surrounding stranding along fascia planes, probably sequela from prior radiation therapy.  There was multilevel lumbar impingement due to spondylosis and degenerative disc disease.  There was heterogeneous enhancement in the uterus (some possibly from prior radiation therapy).  PET scan on 04/23/2018 revealed numerous scattered solid and cavitary nodules in the lungs stable increased in size compared to the prior PET-CT from 11/08/2017. Largest nodule was 1.1 cm in the LUL (SUV 1.9).  These demonstrated low-grade metabolic activity up to a maximum SUV of about 2.3, increased from 11/08/2017.   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  She is s/p 1 cycle of carboplatin and Taxol (06/04/2018) with Udencya support.  Counts were adequate.  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  Testing was negative for prothrombin gene mutation, lupus anticoagulant panel, anticardiolipin antibodies, and beta-2 glycoprotein antibodies.  She was discharged on Xarelto.  She was admitted to [facility] from 12/03/2017 - 12/08/2017 with abdominal pain. She was not felt to have a uterine infarct.  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  The etiology was unclear.  She is hepatitis C positive.   Hepatitis C genotype is 2a/2c.  She has B12 deficiency.  B12 was 165 on 11/27/2017.  She began B12 injections on 11/29/2017.  B12 injections occur monthly at home.  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  She was diagnosed with colitis.  Abdomen and pelvic CT on 05/23/2018 revealed colonic wall thickening, consistent with colitis. Considerations include inflammatory or infectious  causes. Ischemic causes would be less likely. She is s/p RIGHT hemicolectomy. She had a patent ileocolic anastomosis.  She received a 10 day course of ciprofloxacin and Flagyl.  She has  chronic diarrhea s/p right hemicolectomy.  She is Questran.  Symptomatically, patient has continued issues with her bowels. She is using Questran and Lomotil BID. She has diffuse myalgias. No B symptoms. Exam is grossly unremarkable.  WBC 4500 ([place] 3200).  Platelets 170,000.  Potassium normal at 4.2.  AST 33, ALT 46, ALP 133, and total bilirubin 0.6.  Magnesium normal at 1.8.  Plan:   1. Labs today:  CBC with diff, CMP, Mg. 2. Cervical cancer  Initial treatment cycle complicated by diarrheal illness requiring admission. LFTs were elevated. MRCP demonstrated a presumed cystic duct stricture.   Labs reviewed. Blood counts stable and adequate enough for treatment. Will proceed with cycle #2 paclitaxel + carboplatin with Udencya support.  Discuss initial dose reduction (tolerated).  Discuss increasing dose by 10%.  Patient in agreement. Discuss symptom management.  Patient has antiemetics and pain medications at home to use on a PRN basis. Patient  advising that the  prescribed interventions are adequate at this point. Continue all medications as previously prescribed. 3. Elevated LFTs  Improving.  AST 33, ALT 46, ALP 133, and total bilirubin 0.6.  Continue to monitor. 4. Diarrhea  Chronic bowel issues related to previous partial colectomy.  Patient notes improvement with Questran and Lomotil twice daily.  Continue as previously prescribed.  Encouraged increased intake of electrolyte-containing fluids to prevent dehydration.  Schedule follow-up with GI. 5. Electrolyte derangements  Labs reviewed.  All electrolytes noted to be normal.  She continues on magnesium glycinate 100 mg daily. 6. B12 deficiency  Continues on monthly parenteral B12 supplementation.  Patient';s sister administers injections at  home.  Next injection due today. 7. RTC on 06/26/2018 for labs (BMP, Mg), Udencya, +/- IV Mg 8. RTC on 06/28/2018 for labs (Mg) and +/- IVFs and Mg.  9. RTC on 07/01/2018 for labs (BMP, Mg) and +/- IVFs and  Mg 10. RTC on 07/05/2018 for MD nadir assessment, labs (CBC with diff, CMP, Mg) 11. RTC on 07/16/2018 for MD assessment, labs (CBC with diff, CMP, Mg) and cycle #3 paclitaxel + carboplatin with Margarette [place] support   Honor Loh, NP  06/25/2018, 10:32 AM   I saw and evaluated the patient, participating in the key portions of the service and reviewing pertinent diagnostic studies and records.  I reviewed the nurse practitioner's note and agree with the findings and the plan.  The assessment and plan were discussed with the patient.  Multiple questions were asked by the patient and answered.   Nolon Stalls, MD 06/25/2018,10:32 AM

## 2018-06-26 ENCOUNTER — Inpatient Hospital Stay: Payer: Medicaid Other

## 2018-06-26 VITALS — BP 107/76 | HR 87 | Temp 95.3°F | Resp 18

## 2018-06-26 DIAGNOSIS — Z95828 Presence of other vascular implants and grafts: Secondary | ICD-10-CM

## 2018-06-26 DIAGNOSIS — C538 Malignant neoplasm of overlapping sites of cervix uteri: Secondary | ICD-10-CM

## 2018-06-26 DIAGNOSIS — Z5111 Encounter for antineoplastic chemotherapy: Secondary | ICD-10-CM | POA: Diagnosis not present

## 2018-06-26 LAB — COMPREHENSIVE METABOLIC PANEL
ALT: 60 U/L — ABNORMAL HIGH (ref 0–44)
AST: 47 U/L — ABNORMAL HIGH (ref 15–41)
Albumin: 4.1 g/dL (ref 3.5–5.0)
Alkaline Phosphatase: 174 U/L — ABNORMAL HIGH (ref 38–126)
Anion gap: 7 (ref 5–15)
BUN: 15 mg/dL (ref 6–20)
CO2: 26 mmol/L (ref 22–32)
Calcium: 9 mg/dL (ref 8.9–10.3)
Chloride: 103 mmol/L (ref 98–111)
Creatinine, Ser: 0.64 mg/dL (ref 0.44–1.00)
GFR calc Af Amer: >60 mL/min (ref >60)
GFR calc non Af Amer: >60 mL/min (ref >60)
Glucose, Bld: 86 mg/dL (ref 70–99)
Potassium: 4.2 mmol/L (ref 3.5–5.1)
Sodium: 136 mmol/L (ref 135–145)
Total Bilirubin: 0.7 mg/dL (ref 0.3–1.2)
Total Protein: 7.9 g/dL (ref 6.5–8.1)

## 2018-06-26 LAB — MAGNESIUM: Magnesium: 2 mg/dL (ref 1.7–2.4)

## 2018-06-26 MED ORDER — HEPARIN SOD (PORK) LOCK FLUSH 100 UNIT/ML IV SOLN
500.0000 [IU] | Freq: Once | INTRAVENOUS | Status: AC
  Administered 2018-06-26: 500 [IU] via INTRAVENOUS

## 2018-06-26 MED ORDER — PEGFILGRASTIM INJECTION 6 MG/0.6ML ~~LOC~~
6.0000 mg | PREFILLED_SYRINGE | Freq: Once | SUBCUTANEOUS | Status: AC
  Administered 2018-06-26: 6 mg via SUBCUTANEOUS
  Filled 2018-06-26: qty 0.6

## 2018-06-26 NOTE — Progress Notes (Signed)
Pt requesting to leave port accessed until Friday when pt returns, pt given home care instructions, verbalizes understanding.

## 2018-06-28 ENCOUNTER — Inpatient Hospital Stay: Payer: Medicaid Other

## 2018-06-28 DIAGNOSIS — Z5111 Encounter for antineoplastic chemotherapy: Secondary | ICD-10-CM | POA: Diagnosis not present

## 2018-06-28 DIAGNOSIS — C538 Malignant neoplasm of overlapping sites of cervix uteri: Secondary | ICD-10-CM

## 2018-06-28 LAB — BASIC METABOLIC PANEL
Anion gap: 8 (ref 5–15)
BUN: 16 mg/dL (ref 6–20)
CO2: 24 mmol/L (ref 22–32)
Calcium: 9.5 mg/dL (ref 8.9–10.3)
Chloride: 101 mmol/L (ref 98–111)
Creatinine, Ser: 0.64 mg/dL (ref 0.44–1.00)
GFR calc Af Amer: >60 mL/min (ref >60)
GFR calc non Af Amer: >60 mL/min (ref >60)
Glucose, Bld: 120 mg/dL — ABNORMAL HIGH (ref 70–99)
Potassium: 4.2 mmol/L (ref 3.5–5.1)
Sodium: 133 mmol/L — ABNORMAL LOW (ref 135–145)

## 2018-06-28 LAB — MAGNESIUM: Magnesium: 1.7 mg/dL (ref 1.7–2.4)

## 2018-06-28 MED ORDER — HEPARIN SOD (PORK) LOCK FLUSH 100 UNIT/ML IV SOLN
500.0000 [IU] | Freq: Once | INTRAVENOUS | Status: AC
  Administered 2018-06-28: 500 [IU] via INTRAVENOUS
  Filled 2018-06-28: qty 5

## 2018-06-28 MED ORDER — SODIUM CHLORIDE 0.9% FLUSH
10.0000 mL | INTRAVENOUS | Status: DC | PRN
  Administered 2018-06-28: 10 mL via INTRAVENOUS
  Filled 2018-06-28: qty 10

## 2018-06-28 MED ORDER — SODIUM CHLORIDE 0.9 % IV SOLN
Freq: Once | INTRAVENOUS | Status: AC
  Administered 2018-06-28: 10:00:00 via INTRAVENOUS
  Filled 2018-06-28: qty 250

## 2018-06-28 MED ORDER — ONDANSETRON HCL 4 MG/2ML IJ SOLN
8.0000 mg | Freq: Once | INTRAMUSCULAR | Status: AC
  Administered 2018-06-28: 8 mg via INTRAVENOUS
  Filled 2018-06-28: qty 4

## 2018-06-28 MED ORDER — SODIUM CHLORIDE 0.9 % IV SOLN: Freq: Once | INTRAVENOUS | Status: DC

## 2018-06-28 NOTE — Progress Notes (Signed)
Vital signs stable. Patient states, "I do not feel good or like myself today. I have been nauseas the last couple of days. I have not vomited. I haven't had much to eat or drink. I had diarrhea yesterday and have been so fatigued. I feel like I might need some IV fluids today and something to help my nausea." NP, Honor Loh, notified and aware. Lab work reviewed with NP. Orders received from NP, see MAR.

## 2018-07-01 ENCOUNTER — Other Ambulatory Visit: Payer: Self-pay | Admitting: Hematology and Oncology

## 2018-07-01 ENCOUNTER — Inpatient Hospital Stay: Payer: Medicaid Other

## 2018-07-01 ENCOUNTER — Other Ambulatory Visit: Payer: Self-pay | Admitting: Urgent Care

## 2018-07-01 VITALS — BP 106/78 | HR 94 | Temp 96.0°F | Resp 20

## 2018-07-01 DIAGNOSIS — Z5111 Encounter for antineoplastic chemotherapy: Secondary | ICD-10-CM | POA: Diagnosis not present

## 2018-07-01 DIAGNOSIS — C538 Malignant neoplasm of overlapping sites of cervix uteri: Secondary | ICD-10-CM

## 2018-07-01 LAB — BASIC METABOLIC PANEL
Anion gap: 9 (ref 5–15)
BUN: 13 mg/dL (ref 6–20)
CO2: 24 mmol/L (ref 22–32)
Calcium: 9.4 mg/dL (ref 8.9–10.3)
Chloride: 103 mmol/L (ref 98–111)
Creatinine, Ser: 0.76 mg/dL (ref 0.44–1.00)
GFR calc Af Amer: >60 mL/min (ref >60)
GFR calc non Af Amer: >60 mL/min (ref >60)
Glucose, Bld: 106 mg/dL — ABNORMAL HIGH (ref 70–99)
Potassium: 3.9 mmol/L (ref 3.5–5.1)
Sodium: 136 mmol/L (ref 135–145)

## 2018-07-01 LAB — MAGNESIUM: Magnesium: 1.5 mg/dL — ABNORMAL LOW (ref 1.7–2.4)

## 2018-07-01 MED ORDER — SODIUM CHLORIDE 0.9% FLUSH
10.0000 mL | INTRAVENOUS | Status: DC | PRN
  Administered 2018-07-01: 10 mL via INTRAVENOUS
  Filled 2018-07-01: qty 10

## 2018-07-01 MED ORDER — SODIUM CHLORIDE 0.9 % IV SOLN: 4.0000 g | Freq: Once | INTRAVENOUS | Status: DC

## 2018-07-01 MED ORDER — MAGNESIUM SULFATE 2 GM/50ML IV SOLN
2.0000 g | Freq: Once | INTRAVENOUS | Status: AC
  Administered 2018-07-01: 2 g via INTRAVENOUS
  Filled 2018-07-01: qty 50

## 2018-07-01 MED ORDER — SODIUM CHLORIDE 0.9 % IV SOLN: 2.0000 g | Freq: Once | INTRAVENOUS | Status: DC

## 2018-07-01 MED ORDER — SODIUM CHLORIDE 0.9 % IV SOLN
Freq: Once | INTRAVENOUS | Status: AC
  Administered 2018-07-01: 09:00:00 via INTRAVENOUS
  Filled 2018-07-01: qty 250

## 2018-07-01 MED ORDER — HEPARIN SOD (PORK) LOCK FLUSH 100 UNIT/ML IV SOLN
500.0000 [IU] | Freq: Once | INTRAVENOUS | Status: AC
  Administered 2018-07-01: 500 [IU] via INTRAVENOUS
  Filled 2018-07-01: qty 5

## 2018-07-01 NOTE — Progress Notes (Signed)
Magnesium 4gram/18m over 1 hour is ordered.  Mag =1.5.  Contacted MD to see if could change to premixed bag 4gm/1069mand it is usually given over 2 hours.  RN, states MD to change order to give 2gram mag today.

## 2018-07-03 ENCOUNTER — Encounter: Payer: Self-pay | Admitting: Emergency Medicine

## 2018-07-03 ENCOUNTER — Other Ambulatory Visit: Payer: Self-pay

## 2018-07-03 ENCOUNTER — Emergency Department: Payer: Medicaid Other

## 2018-07-03 ENCOUNTER — Emergency Department
Admission: EM | Admit: 2018-07-03 | Discharge: 2018-07-03 | Disposition: A | Discharge status: Home / Self Care | Payer: Medicaid Other | Attending: Emergency Medicine | Admitting: Emergency Medicine

## 2018-07-03 DIAGNOSIS — Z79899 Other long term (current) drug therapy: Secondary | ICD-10-CM | POA: Insufficient documentation

## 2018-07-03 DIAGNOSIS — I10 Essential (primary) hypertension: Secondary | ICD-10-CM | POA: Insufficient documentation

## 2018-07-03 DIAGNOSIS — Z87891 Personal history of nicotine dependence: Secondary | ICD-10-CM | POA: Diagnosis not present

## 2018-07-03 DIAGNOSIS — Z96652 Presence of left artificial knee joint: Secondary | ICD-10-CM | POA: Diagnosis not present

## 2018-07-03 DIAGNOSIS — E876 Hypokalemia: Secondary | ICD-10-CM | POA: Insufficient documentation

## 2018-07-03 DIAGNOSIS — M25562 Pain in left knee: Secondary | ICD-10-CM

## 2018-07-03 DIAGNOSIS — D61818 Other pancytopenia: Secondary | ICD-10-CM | POA: Diagnosis not present

## 2018-07-03 DIAGNOSIS — Z7901 Long term (current) use of anticoagulants: Secondary | ICD-10-CM | POA: Insufficient documentation

## 2018-07-03 DIAGNOSIS — M25462 Effusion, left knee: Secondary | ICD-10-CM | POA: Diagnosis not present

## 2018-07-03 DIAGNOSIS — Z8541 Personal history of malignant neoplasm of cervix uteri: Secondary | ICD-10-CM | POA: Insufficient documentation

## 2018-07-03 DIAGNOSIS — Z85118 Personal history of other malignant neoplasm of bronchus and lung: Secondary | ICD-10-CM | POA: Insufficient documentation

## 2018-07-03 LAB — CBC WITH DIFFERENTIAL/PLATELET
BASOS ABS: 0 10*3/uL (ref 0–0.1)
Basophils Relative: 1 %
EOS ABS: 0.1 10*3/uL (ref 0–0.7)
EOS PCT: 2 %
HCT: 26.1 % — ABNORMAL LOW (ref 35.0–47.0)
Hemoglobin: 9 g/dL — ABNORMAL LOW (ref 12.0–16.0)
LYMPHS PCT: 23 %
Lymphs Abs: 0.7 10*3/uL — ABNORMAL LOW (ref 1.0–3.6)
MCH: 33.9 pg (ref 26.0–34.0)
MCHC: 34.5 g/dL (ref 32.0–36.0)
MCV: 98.3 fL (ref 80.0–100.0)
Monocytes Absolute: 0.2 10*3/uL (ref 0.2–0.9)
Monocytes Relative: 8 %
Neutro Abs: 2 10*3/uL (ref 1.4–6.5)
Neutrophils Relative %: 66 %
PLATELETS: 70 10*3/uL — AB (ref 150–440)
RBC: 2.66 MIL/uL — AB (ref 3.80–5.20)
RDW: 14.2 % (ref 11.5–14.5)
WBC: 3 10*3/uL — AB (ref 3.6–11.0)

## 2018-07-03 LAB — BASIC METABOLIC PANEL
Anion gap: 7 (ref 5–15)
BUN: 11 mg/dL (ref 6–20)
CHLORIDE: 108 mmol/L (ref 98–111)
CO2: 23 mmol/L (ref 22–32)
CREATININE: 0.57 mg/dL (ref 0.44–1.00)
Calcium: 7.9 mg/dL — ABNORMAL LOW (ref 8.9–10.3)
GFR calc Af Amer: >60 mL/min (ref >60)
GLUCOSE: 98 mg/dL (ref 70–99)
Potassium: 3 mmol/L — ABNORMAL LOW (ref 3.5–5.1)
SODIUM: 138 mmol/L (ref 135–145)

## 2018-07-03 LAB — SEDIMENTATION RATE: Sed Rate: 52 mm/hr — ABNORMAL HIGH (ref 0–30)

## 2018-07-03 LAB — C-REACTIVE PROTEIN: CRP: <0.8 mg/dL (ref <1.0)

## 2018-07-03 LAB — URIC ACID: Uric Acid, Serum: 5.2 mg/dL (ref 2.5–7.1)

## 2018-07-03 MED ORDER — OXYCODONE-ACETAMINOPHEN 5-325 MG PO TABS: 1.0000 | ORAL_TABLET | ORAL | 0 refills | Status: DC | PRN

## 2018-07-03 MED ORDER — KETOROLAC TROMETHAMINE 30 MG/ML IJ SOLN
15.0000 mg | Freq: Once | INTRAMUSCULAR | Status: AC
  Administered 2018-07-03: 15 mg via INTRAVENOUS
  Filled 2018-07-03: qty 1

## 2018-07-03 MED ORDER — OXYCODONE-ACETAMINOPHEN 5-325 MG PO TABS
1.0000 | ORAL_TABLET | Freq: Once | ORAL | Status: AC
  Administered 2018-07-03: 1 via ORAL
  Filled 2018-07-03: qty 1

## 2018-07-03 MED ORDER — IBUPROFEN 600 MG PO TABS: 600.0000 mg | ORAL_TABLET | Freq: Three times a day (TID) | ORAL | 0 refills | Status: DC | PRN

## 2018-07-03 MED ORDER — HEPARIN SOD (PORK) LOCK FLUSH 100 UNIT/ML IV SOLN
500.0000 [IU] | Freq: Once | INTRAVENOUS | Status: AC
  Administered 2018-07-03: 500 [IU] via INTRAVENOUS
  Filled 2018-07-03: qty 5

## 2018-07-03 MED ORDER — POTASSIUM CHLORIDE CRYS ER 20 MEQ PO TBCR
40.0000 meq | EXTENDED_RELEASE_TABLET | Freq: Once | ORAL | Status: AC
  Administered 2018-07-03: 40 meq via ORAL
  Filled 2018-07-03: qty 2

## 2018-07-03 NOTE — ED Provider Notes (Signed)
[facility] Emergency Department Provider Note   ____________________________________________   First MD Initiated Contact with Patient 07/03/18 [phone]     (approximate)  I have reviewed the triage vital signs and the nursing notes.   HISTORY  Chief Complaint Knee Pain    HPI Joanna Hall is a 52 y.o. female who presents to the ED from home with a chief complaint of nontraumatic left knee pain.  Patient had left total knee arthroplasty on 04/24/2018.  States she has had chronic pain since surgery.  Also has cervical cancer on chemotherapy.  States she lives with her parents and was sleeping on the lower level of the house after her knee surgery.  She moved upstairs approximately 3 weeks ago and has been going up and down flights of stairs since.  Complains of pain and swelling to her left knee for the past several days.  Denies fever, chills, chest pain, shortness of breath, abdominal pain, nausea or vomiting.  Denies fall/injury/trauma.   Past Medical History:  Diagnosis Date  . Abnormal cervical Papanicolaou smear 09/18/2017  . Anxiety   . Aortic atherosclerosis ([place])   . Arthritis    neck and knees  . 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 Blood transfusion without reported diagnosis   . Cervical cancer (HCC)    mets lung  . Chronic anal fissure   . Chronic diarrhea   . Dyspnea   . Factor V Leiden mutation ([place])   . Fecal incontinence   . Genital warts   . GERD (gastroesophageal reflux disease)   . Heart murmur   . Hemorrhoids   . 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 History of cancer chemotherapy    completed 06/2017  . History of Clostridium difficile infection    while undergoing chemo.  Negative test 10/2017  . Infarction of kidney ([place]) left kidney   and uterus  . Intestinal infection due to Clostridium difficile 09/18/2017  . Macrocytic anemia with vitamin B12 deficiency   . Perianal  condylomata   . Pneumonia    History of  . Pulmonary nodules   . Rectal bleeding   . Small bowel obstruction ([place]) 08/2017  . Stiff neck    limited right turn  . Vitamin D deficiency     Patient Active Problem List   Diagnosis Date Noted  . Chronic diarrhea 06/25/2018  . Chronic anticoagulation 06/25/2018  . Vomiting   . Abdominal pain 06/10/2018  . Encounter for antineoplastic chemotherapy 06/04/2018  . 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 Lung metastasis ([place]) 05/15/2018  . S/P TKR (total knee replacement) using cement, left 04/24/2018  . Osteoarthritis of left knee 02/28/2018  . Condyloma acuminatum of anus s/p ablation 02/22/2018 02/22/2018  . Condyloma acuminatum of vagina s/p ablation 02/22/2018 02/22/2018  . Positive ANA (antinuclear antibody) 02/04/2018  . Aortic atherosclerosis ([place]) 01/15/2018  . Elevated MCV 01/15/2018  . Anemia 01/15/2018  . Genital warts 01/15/2018  . Vitamin D deficiency 01/15/2018  . Pernicious anemia   . Impingement syndrome of shoulder region 12/19/2017  . Multiple joint pain 12/19/2017  . Osteoarthritis of right knee 12/19/2017  . B12 deficiency 12/11/2017  . Lung nodules 12/11/2017  . History of Clostridium difficile infection 10/23/2017  . Factor V Leiden ([place]) 10/19/2017  . Goals of care, counseling/discussion 10/19/2017  . Cervical cancer, FIGO stage IB1 ([place]) 09/18/2017  . Chronic hepatitis  C without hepatic coma ([place]) 09/18/2017  . Cytopenia 09/18/2017  . Diarrhea 09/18/2017  . Erosive gastropathy 09/18/2017  . Lower abdominal pain 09/18/2017  . 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 Malignant neoplasm of overlapping sites of cervix ([place]) 09/18/2017  . Renal insufficiency 09/18/2017  . Wound infection after surgery 09/12/2017  . Hypokalemia   . Hypomagnesemia   . Ileocolic anastomotic leak   . Cervical arthritis 07/18/2017  . Dysuria 06/20/2017  . Metastatic cancer ([place]) 05/12/2017  . Hepatitis, chronic ([place]) 05/05/2017  .  Essential hypertension 03/15/2017  . Anemia in other chronic diseases classified elsewhere 03/01/2017  . Chemotherapy-induced neutropenia ([place]) 01/29/2017  . 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 CHOLECYSTECTOMY    . COLON SURGERY  08/2017   resection  . COLONOSCOPY WITH PROPOFOL N/A 12/20/2017   Procedure: COLONOSCOPY WITH PROPOFOL;  Surgeon: [name], MD;  Location: [facility] ENDOSCOPY;  Service: Gastroenterology;  Laterality: N/A;  . DIAGNOSTIC LAPAROSCOPY    . ESOPHAGOGASTRODUODENOSCOPY (EGD) WITH PROPOFOL N/A 12/20/2017   Procedure: ESOPHAGOGASTRODUODENOSCOPY (EGD) WITH PROPOFOL;  Surgeon: [name], MD;  Location: [place];  Service: Gastroenterology;  Laterality: N/A;  . LAPAROTOMY N/A 08/31/2017   Procedure: EXPLORATORY LAPAROTOMY for SBO, ileocolectomy, removal of piece of uterine wall;  Surgeon: [name], MD;  Location: [facility] ORS;  Service: General;  Laterality: N/A;  . LASER ABLATION CONDOLAMATA N/A 02/22/2018   Procedure: LASER ABLATION/REMOVAL OF ELTRVUYEBXI AROUND ANUS AND VAGINA;  Surgeon: Michael Boston, MD;  Location: [place];  Service: General;  Laterality: N/A;  . OOPHORECTOMY    . PORTA CATH INSERTION N/A 05/13/2018   Procedure: PORTA CATH INSERTION;  Surgeon: Katha Cabal, MD;  Location: [place] [facility];  Service: Cardiovascular;  Laterality: N/A;  . SMALL INTESTINE SURGERY    . TANDEM RING INSERTION     x3  . THORACOTOMY    . TOTAL KNEE ARTHROPLASTY Left 04/24/2018   Procedure: TOTAL KNEE ARTHROPLASTY;  Surgeon: Lovell Sheehan, MD;  Location: [facility] ORS;  Service: Orthopedics;  Laterality: Left;    Prior to Admission medications   Medication Sig Start Date End Date Taking? Authorizing Provider  Calcium Carb-Cholecalciferol (CALCIUM 500 +D) 500-400 MG-UNIT TABS Take 2 tablets by mouth daily.    [provider]  colestipol (COLESTID) 1 g  tablet Take 2 tablets (2 g total) by mouth 2 (two) times daily. 06/12/18   Hillary Bow, MD  cyanocobalamin (,VITAMIN B-12,) 1000 MCG/ML injection Inject 1ML every week x 4 weeks, then 1ML every other week x 2 months, then monthly x 3 months Patient taking differently: Inject 1,000 mcg into the muscle every 30 (thirty) days. Inject 1ML every week x 4 weeks, then 1ML every other week x 2 months, then monthly x 3 months 11/29/17   [name], MD  dicyclomine (BENTYL) 20 MG tablet Take 1 tablet (20 mg total) by mouth every 6 (six) hours. 05/23/18   Jacquelin Hawking, NP  fluticasone (FLONASE) 50 MCG/ACT nasal spray Place 2 sprays into both nostrils daily. 02/11/18   Steele Sizer, MD  ibuprofen (ADVIL,MOTRIN) 600 MG tablet Take 1 tablet (600 mg total) by mouth every 8 (eight) hours as needed. 07/03/18   Paulette Blanch, MD  lidocaine-prilocaine (EMLA) cream Apply 1 application topically as needed. Apply small amount to port site at least 1 hour prior to it being accessed, cover with plastic wrap 05/20/18   Pearline Cables,  Doris Cheadle, NP  loratadine (CLARITIN) 10 MG tablet Take 10 mg by mouth daily.    [provider]  LORazepam (ATIVAN) 0.5 MG tablet Take 1 tablet (0.5 mg total) by mouth every 6 (six) hours as needed (nausea). 06/04/18   Lequita Asal, MD  Magnesium Bisglycinate (MAG GLYCINATE) 100 MG TABS Take 100 mg by mouth daily. 06/04/18   Karen Kitchens, NP  magnesium oxide (MAG-OX) 400 MG tablet Take 1 tablet (400 mg total) by mouth daily. 05/28/18   Karen Kitchens, NP  ondansetron (ZOFRAN) 8 MG tablet Take 1 tablet (8 mg total) by mouth every 8 (eight) hours as needed for nausea or vomiting. 06/21/18   Jacquelin Hawking, NP  oxyCODONE 10 MG TABS Take 1 tablet (10 mg total) by mouth every 4 (four) hours as needed for severe pain (pain score 7-10). 04/27/18   Lovell Sheehan, MD  oxyCODONE-acetaminophen (PERCOCET/ROXICET) 5-325 MG tablet Take 1 tablet by mouth every 4 (four) hours as needed for severe  pain. 07/03/18   Paulette Blanch, MD  promethazine (PHENERGAN) 25 MG tablet Take 1 tablet (25 mg total) by mouth every 6 (six) hours as needed for nausea or vomiting. 06/21/18   Jacquelin Hawking, NP  rivaroxaban (XARELTO) 20 MG TABS tablet Take 1 tablet (20 mg total) by mouth daily with supper. 03/13/18   Lequita Asal, MD  sertraline (ZOLOFT) 50 MG tablet Take 1 tablet (50 mg total) by mouth daily. 04/02/18   Lada, Satira Anis, MD  Syringe/Needle, Disp, (SYRINGE 3CC/22GX1") 22G X 1" 3 ML MISC For use with Vitamin B12 injections 04/12/18   Vanga, Tally Due, MD  traMADol (ULTRAM) 50 MG tablet Take 1 tablet (50 mg total) by mouth every 6 (six) hours as needed. 06/21/18   Jacquelin Hawking, NP  VENTOLIN HFA 108 (90 Base) MCG/ACT inhaler Inhale 1-2 puffs into the lungs every 4 (four) hours as needed for shortness of breath.     [provider]    Allergies Ketamine  Family History  Problem Relation Age of Onset  . Hypertension Father   . Diabetes Father   . Alcohol abuse Daughter   . Hypertension Maternal Grandmother   . Diabetes Maternal Grandmother   . Diabetes Paternal Grandmother   . Hypertension Paternal Grandmother     Social History Social History   Tobacco Use  . Smoking status: Former Smoker    Last attempt to quit: 10/16/2006    Years since quitting: 11.7  . Smokeless tobacco: Never Used  Substance Use Topics  . Alcohol use: Yes    Frequency: Never    Comment: seldom  . Drug use: Yes    Types: Marijuana    Review of Systems  Constitutional: No fever/chills Eyes: No visual changes. ENT: No sore throat. Cardiovascular: Denies chest pain. Respiratory: Denies shortness of breath. Gastrointestinal: No abdominal pain.  No nausea, no vomiting.  No diarrhea.  No constipation. Genitourinary: Negative for dysuria. Musculoskeletal: Positive for left knee pain.  Negative for back pain. Skin: Negative for rash. Neurological: Negative for headaches, focal weakness or  numbness.   ____________________________________________   PHYSICAL EXAM:  VITAL SIGNS: ED Triage Vitals [07/03/18 0020]  Enc Vitals Group     BP 122/78     Pulse Rate 80     Resp 18     Temp 97.9 F (36.6 C)     Temp Source Oral     SpO2 100 %  Weight 167 lb (75.8 kg)     Height 5' 7"  (1.702 m)     Head Circumference      Peak Flow      Pain Score 10     Pain Loc      Pain Edu?      Excl. in [place]?     Constitutional: Alert and oriented.  Chronically ill appearing and in mild acute distress. Eyes: Conjunctivae are normal. PERRL. EOMI. Head: Atraumatic. Nose: No congestion/rhinnorhea. Mouth/Throat: Mucous membranes are moist.  Oropharynx non-erythematous. Neck: No stridor.   Cardiovascular: Normal rate, regular rhythm. Grossly normal heart sounds.  Good peripheral circulation. Respiratory: Normal respiratory effort.  No retractions. Lungs CTAB. Gastrointestinal: Soft and nontender. No distention. No abdominal bruits. No CVA tenderness. Musculoskeletal:  LLE: Well-healed knee incision without warmth or erythema.  Mild to moderate effusion.  Tender to palpation anterior and inner knee.  Full range of motion with mild pain.  2+ distal pulses.  Brisk, less than 5-second capillary refill.. Neurologic:  Normal speech and language. No gross focal neurologic deficits are appreciated.  Skin:  Skin is warm, dry and intact. No rash noted. Psychiatric: Mood and affect are normal. Speech and behavior are normal.  ____________________________________________   LABS (all labs ordered are listed, but only abnormal results are displayed)  Labs Reviewed  CBC WITH DIFFERENTIAL/PLATELET - Abnormal; Notable for the following components:      Result Value   WBC 3.0 (*)    RBC 2.66 (*)    Hemoglobin 9.0 (*)    HCT 26.1 (*)    Platelets 70 (*)    Lymphs Abs 0.7 (*)    All other components within normal limits  BASIC METABOLIC PANEL - Abnormal; Notable for the following components:     Potassium 3.0 (*)    Calcium 7.9 (*)    All other components within normal limits  SEDIMENTATION RATE - Abnormal; Notable for the following components:   Sed Rate 52 (*)    All other components within normal limits  URIC ACID  C-REACTIVE PROTEIN   ____________________________________________  EKG  None ____________________________________________  RADIOLOGY  ED MD interpretation: No acute fracture or dislocation  Official radiology report(s): Dg Knee Complete 4 Views Left  Result Date: 07/03/2018 CLINICAL DATA:  Left knee pain and swelling for a few days. No known injury. EXAM: LEFT KNEE - COMPLETE 4+ VIEW COMPARISON:  04/24/2018 FINDINGS: Left total knee arthroplasty with patellar femoral component. Components appear well seated. No evidence of acute fracture or dislocation. No focal bone lesion or bone destruction. Moderate-sized left knee effusion. Prominent soft tissue shadow over the popliteal region may represent a popliteal cyst. IMPRESSION: Left total knee arthroplasty. No acute bony abnormalities. Soft tissue shadow over the popliteal region may represent a popliteal cyst. Moderate left knee effusion. Electronically Signed   By: Lucienne Capers M.D.   On: 07/03/2018 00:59    ____________________________________________   PROCEDURES  Procedure(s) performed: None  Procedures  Critical Care performed: No  ____________________________________________   INITIAL IMPRESSION / ASSESSMENT AND PLAN / ED COURSE  As part of my medical decision making, I reviewed the following data within the [place] notes reviewed and incorporated, Old chart reviewed, Radiograph reviewed and Notes from prior ED visits   52 year old female who presents with left knee pain and swelling.  Status post total knee arthroplasty on 7/10.  On chemotherapy for cervical cancer.  Differential diagnosis includes but is not limited to septic joint, bursitis,  gouty  arthritis, osteoarthritis, etc.  Given patient's comorbidities, will obtain lab work including sed rate and CRP (send out study).  Clinically left knee does not appear infected.  Low suspicion for septic joint as patient can freely move her knee with some pain.  Will administer 15 mg IV Toradol.  With oral Percocet for pain.  Will elevate affected area and place ice.  Clinically there is not enough effusion for me to comfortably perform arthrocentesis when risks weighed against benefits.  Will reassess.  Clinical Course as of Jul 03 653  Wed Jul 03, 2018  [id number] She is sleeping no acute distress.  Pain is significantly improved.  Laboratory results noted.  She received chemotherapy several days ago.  Patient is pancytopenic with mild hypokalemia.  Sed rate is mildly elevated; it has been higher in the past.  This is not suggestive of septic joint.  Will discharge home with NSAIDs and Percocet to use as needed for pain.  She will follow-up closely with her oncologist as scheduled in 2 days.  Patient will follow-up with her orthopedist as well.  Strict return precautions given.  Patient verbalizes understanding and agrees with plan of care.   [JS]    Clinical Course User Index [JS] Paulette Blanch, MD     ____________________________________________   FINAL CLINICAL IMPRESSION(S) / ED DIAGNOSES  Final diagnoses:  Effusion of left knee  Acute pain of left knee  Hypokalemia  Pancytopenia [facility])     ED Discharge Orders         Ordered    ibuprofen (ADVIL,MOTRIN) 600 MG tablet  Every 8 hours PRN     07/03/18 0526    oxyCODONE-acetaminophen (PERCOCET/ROXICET) 5-325 MG tablet  Every 4 hours PRN     07/03/18 0526           Note:  This document was prepared using Dragon voice recognition software and may include unintentional dictation errors.    Paulette Blanch, MD 07/03/18 [phone]

## 2018-07-03 NOTE — ED Triage Notes (Signed)
Pt to triage via w/c with no distress noted, mask in place; pt reports left TKR 6/10; c/o pain left knee pain & swelling last few days with no known injury; incision healed with no redness noted

## 2018-07-03 NOTE — ED Notes (Signed)
Patient is waiting for family to come pick her up. Patient has been given d/c paperwork and signed for d/c

## 2018-07-03 NOTE — Discharge Instructions (Addendum)
1.  You may take pain medicines as needed (Motrin/Percocet #15). 2.  You may remove Ace wrap to bathe and sleep. 3.  Elevate affected area and apply ice several times daily to reduce swelling. 4.  Use your walker for balance as you walk. 5.  Return to the ER for worsening symptoms, fever/chills, persistent vomiting, difficulty breathing or other concerns.

## 2018-07-05 ENCOUNTER — Inpatient Hospital Stay: Payer: Medicaid Other

## 2018-07-05 ENCOUNTER — Other Ambulatory Visit: Payer: Self-pay | Admitting: Urgent Care

## 2018-07-05 DIAGNOSIS — Z5111 Encounter for antineoplastic chemotherapy: Secondary | ICD-10-CM | POA: Diagnosis not present

## 2018-07-05 DIAGNOSIS — C538 Malignant neoplasm of overlapping sites of cervix uteri: Secondary | ICD-10-CM

## 2018-07-05 DIAGNOSIS — E876 Hypokalemia: Secondary | ICD-10-CM

## 2018-07-05 DIAGNOSIS — C530 Malignant neoplasm of endocervix: Secondary | ICD-10-CM

## 2018-07-05 DIAGNOSIS — D649 Anemia, unspecified: Secondary | ICD-10-CM

## 2018-07-05 LAB — CBC WITH DIFFERENTIAL/PLATELET
Basophils Absolute: 0 10*3/uL (ref 0–0.1)
Basophils Relative: 0 %
Eosinophils Absolute: 0 10*3/uL (ref 0–0.7)
Eosinophils Relative: 1 %
HCT: 30.8 % — ABNORMAL LOW (ref 35.0–47.0)
Hemoglobin: 10.7 g/dL — ABNORMAL LOW (ref 12.0–16.0)
Lymphocytes Relative: 20 %
Lymphs Abs: 0.9 10*3/uL — ABNORMAL LOW (ref 1.0–3.6)
MCH: 33.8 pg (ref 26.0–34.0)
MCHC: 34.7 g/dL (ref 32.0–36.0)
MCV: 97.6 fL (ref 80.0–100.0)
Monocytes Absolute: 0.3 10*3/uL (ref 0.2–0.9)
Monocytes Relative: 7 %
Neutro Abs: 3.1 10*3/uL (ref 1.4–6.5)
Neutrophils Relative %: 72 %
Platelets: 90 10*3/uL — ABNORMAL LOW (ref 150–440)
RBC: 3.15 MIL/uL — ABNORMAL LOW (ref 3.80–5.20)
RDW: 14 % (ref 11.5–14.5)
WBC: 4.4 10*3/uL (ref 3.6–11.0)

## 2018-07-05 LAB — COMPREHENSIVE METABOLIC PANEL
ALT: 45 U/L — ABNORMAL HIGH (ref 0–44)
AST: 28 U/L (ref 15–41)
Albumin: 3.8 g/dL (ref 3.5–5.0)
Alkaline Phosphatase: 153 U/L — ABNORMAL HIGH (ref 38–126)
Anion gap: 7 (ref 5–15)
BUN: 8 mg/dL (ref 6–20)
CO2: 21 mmol/L — ABNORMAL LOW (ref 22–32)
Calcium: 9.1 mg/dL (ref 8.9–10.3)
Chloride: 108 mmol/L (ref 98–111)
Creatinine, Ser: 0.61 mg/dL (ref 0.44–1.00)
GFR calc Af Amer: >60 mL/min (ref >60)
GFR calc non Af Amer: >60 mL/min (ref >60)
Glucose, Bld: 106 mg/dL — ABNORMAL HIGH (ref 70–99)
Potassium: 3.9 mmol/L (ref 3.5–5.1)
Sodium: 136 mmol/L (ref 135–145)
Total Bilirubin: 0.4 mg/dL (ref 0.3–1.2)
Total Protein: 7.2 g/dL (ref 6.5–8.1)

## 2018-07-05 LAB — MAGNESIUM: Magnesium: 1.3 mg/dL — ABNORMAL LOW (ref 1.7–2.4)

## 2018-07-08 ENCOUNTER — Other Ambulatory Visit: Payer: Self-pay

## 2018-07-08 ENCOUNTER — Inpatient Hospital Stay: Payer: Medicaid Other

## 2018-07-08 ENCOUNTER — Inpatient Hospital Stay (HOSPITAL_BASED_OUTPATIENT_CLINIC_OR_DEPARTMENT_OTHER): Payer: Medicaid Other | Admitting: Hematology and Oncology

## 2018-07-08 ENCOUNTER — Encounter: Payer: Self-pay | Admitting: Hematology and Oncology

## 2018-07-08 VITALS — BP 105/70 | HR 73 | Temp 97.6°F | Resp 18 | Wt 175.2 lb

## 2018-07-08 DIAGNOSIS — R7989 Other specified abnormal findings of blood chemistry: Secondary | ICD-10-CM

## 2018-07-08 DIAGNOSIS — C7800 Secondary malignant neoplasm of unspecified lung: Secondary | ICD-10-CM

## 2018-07-08 DIAGNOSIS — E871 Hypo-osmolality and hyponatremia: Secondary | ICD-10-CM

## 2018-07-08 DIAGNOSIS — E538 Deficiency of other specified B group vitamins: Secondary | ICD-10-CM

## 2018-07-08 DIAGNOSIS — E860 Dehydration: Secondary | ICD-10-CM

## 2018-07-08 DIAGNOSIS — D6851 Activated protein C resistance: Secondary | ICD-10-CM

## 2018-07-08 DIAGNOSIS — R945 Abnormal results of liver function studies: Secondary | ICD-10-CM

## 2018-07-08 DIAGNOSIS — C530 Malignant neoplasm of endocervix: Secondary | ICD-10-CM

## 2018-07-08 DIAGNOSIS — R110 Nausea: Secondary | ICD-10-CM

## 2018-07-08 DIAGNOSIS — R197 Diarrhea, unspecified: Secondary | ICD-10-CM

## 2018-07-08 DIAGNOSIS — D649 Anemia, unspecified: Secondary | ICD-10-CM

## 2018-07-08 DIAGNOSIS — R1033 Periumbilical pain: Secondary | ICD-10-CM

## 2018-07-08 DIAGNOSIS — C7802 Secondary malignant neoplasm of left lung: Secondary | ICD-10-CM | POA: Diagnosis not present

## 2018-07-08 DIAGNOSIS — E876 Hypokalemia: Secondary | ICD-10-CM

## 2018-07-08 DIAGNOSIS — Z5111 Encounter for antineoplastic chemotherapy: Secondary | ICD-10-CM | POA: Diagnosis not present

## 2018-07-08 DIAGNOSIS — C538 Malignant neoplasm of overlapping sites of cervix uteri: Secondary | ICD-10-CM

## 2018-07-08 DIAGNOSIS — Z79899 Other long term (current) drug therapy: Secondary | ICD-10-CM

## 2018-07-08 DIAGNOSIS — G893 Neoplasm related pain (acute) (chronic): Secondary | ICD-10-CM

## 2018-07-08 DIAGNOSIS — Z923 Personal history of irradiation: Secondary | ICD-10-CM

## 2018-07-08 LAB — HEPATIC FUNCTION PANEL
ALBUMIN: 3.4 g/dL — AB (ref 3.5–5.0)
ALK PHOS: 132 U/L — AB (ref 38–126)
ALT: 33 U/L (ref 0–44)
AST: 26 U/L (ref 15–41)
Bilirubin, Direct: <0.1 mg/dL (ref 0.0–0.2)
TOTAL PROTEIN: 6.5 g/dL (ref 6.5–8.1)
Total Bilirubin: 0.4 mg/dL (ref 0.3–1.2)

## 2018-07-08 LAB — GASTROINTESTINAL PANEL BY PCR, STOOL (REPLACES STOOL CULTURE)
Adenovirus F40/41: NOT DETECTED
Astrovirus: NOT DETECTED
CRYPTOSPORIDIUM: NOT DETECTED
CYCLOSPORA CAYETANENSIS: NOT DETECTED
Campylobacter species: NOT DETECTED
Entamoeba histolytica: NOT DETECTED
Enteroaggregative E coli (EAEC): NOT DETECTED
Enteropathogenic E coli (EPEC): NOT DETECTED
Enterotoxigenic E coli (ETEC): NOT DETECTED
Giardia lamblia: NOT DETECTED
Norovirus GI/GII: NOT DETECTED
PLESIMONAS SHIGELLOIDES: NOT DETECTED
ROTAVIRUS A: NOT DETECTED
SALMONELLA SPECIES: NOT DETECTED
SHIGELLA/ENTEROINVASIVE E COLI (EIEC): NOT DETECTED
Sapovirus (I, II, IV, and V): NOT DETECTED
Shiga like toxin producing E coli (STEC): NOT DETECTED
VIBRIO SPECIES: NOT DETECTED
Vibrio cholerae: NOT DETECTED
YERSINIA ENTEROCOLITICA: NOT DETECTED

## 2018-07-08 LAB — BASIC METABOLIC PANEL
Anion gap: 7 (ref 5–15)
BUN: 15 mg/dL (ref 6–20)
CALCIUM: 8.7 mg/dL — AB (ref 8.9–10.3)
CO2: 22 mmol/L (ref 22–32)
CREATININE: 0.71 mg/dL (ref 0.44–1.00)
Chloride: 107 mmol/L (ref 98–111)
GFR calc Af Amer: >60 mL/min (ref >60)
Glucose, Bld: 119 mg/dL — ABNORMAL HIGH (ref 70–99)
Potassium: 4.2 mmol/L (ref 3.5–5.1)
SODIUM: 136 mmol/L (ref 135–145)

## 2018-07-08 LAB — CBC WITH DIFFERENTIAL/PLATELET
BASOS PCT: 0 %
Basophils Absolute: 0 10*3/uL (ref 0–0.1)
EOS ABS: 0.1 10*3/uL (ref 0–0.7)
EOS PCT: 1 %
HCT: 27.5 % — ABNORMAL LOW (ref 35.0–47.0)
HEMOGLOBIN: 9.5 g/dL — AB (ref 12.0–16.0)
LYMPHS ABS: 0.8 10*3/uL — AB (ref 1.0–3.6)
Lymphocytes Relative: 18 %
MCH: 33.9 pg (ref 26.0–34.0)
MCHC: 34.4 g/dL (ref 32.0–36.0)
MCV: 98.5 fL (ref 80.0–100.0)
Monocytes Absolute: 0.2 10*3/uL (ref 0.2–0.9)
Monocytes Relative: 5 %
NEUTROS PCT: 76 %
Neutro Abs: 3.2 10*3/uL (ref 1.4–6.5)
PLATELETS: 81 10*3/uL — AB (ref 150–440)
RBC: 2.79 MIL/uL — AB (ref 3.80–5.20)
RDW: 14.1 % (ref 11.5–14.5)
WBC: 4.3 10*3/uL (ref 3.6–11.0)

## 2018-07-08 LAB — C DIFFICILE QUICK SCREEN W PCR REFLEX
C DIFFICILE (CDIFF) TOXIN: NEGATIVE
C Diff antigen: NEGATIVE
C Diff interpretation: NOT DETECTED

## 2018-07-08 LAB — MAGNESIUM: MAGNESIUM: 1.3 mg/dL — AB (ref 1.7–2.4)

## 2018-07-08 MED ORDER — HEPARIN SOD (PORK) LOCK FLUSH 100 UNIT/ML IV SOLN
500.0000 [IU] | Freq: Once | INTRAVENOUS | Status: AC
  Administered 2018-07-08: 500 [IU] via INTRAVENOUS
  Filled 2018-07-08: qty 5

## 2018-07-08 MED ORDER — SODIUM CHLORIDE 0.9 % IV SOLN: 4.0000 g | Freq: Once | INTRAVENOUS | Status: DC

## 2018-07-08 MED ORDER — SODIUM CHLORIDE 0.9% FLUSH
10.0000 mL | Freq: Once | INTRAVENOUS | Status: AC
  Administered 2018-07-08: 10 mL via INTRAVENOUS
  Filled 2018-07-08: qty 10

## 2018-07-08 MED ORDER — SODIUM CHLORIDE 0.9 % IV SOLN
Freq: Once | INTRAVENOUS | Status: AC
  Administered 2018-07-08: 12:00:00 via INTRAVENOUS
  Filled 2018-07-08: qty 250

## 2018-07-08 MED ORDER — MAGNESIUM SULFATE 4 GM/100ML IV SOLN
4.0000 g | Freq: Once | INTRAVENOUS | Status: AC
  Administered 2018-07-08: 4 g via INTRAVENOUS
  Filled 2018-07-08: qty 100

## 2018-07-08 NOTE — Progress Notes (Signed)
[facility] day:  07/08/2018   Chief Complaint: Joanna Hall is a 52 y.o. female with stage IV cervical cancer who is seen for reassessment on day 14 s/p cycle #2 carboplatin and Taxol.  HPI:  The patient was last seen in the medical oncology clinic on 06/25/2018.  At that time, she continued to have issues with her bowels. She was using Questran and Lomotil BID. She had diffuse myalgias.  Exam was grossly unremarkable.  WBC was 4500 ([place] 3200).  Platelets were 170,000.  Potassium was 4.2.  AST 33, ALT 46, ALP 133, and total bilirubin 0.6.  Magnesium was 1.8.  She received cycle #2 carboplatin and Taxol with Udencya support.  Dose was increased by 10%.  Labs have been followed: 07/03/2018:  Hematocrit 26.1, hemoglobin 9.0, MCV 98.3, platelets 70,000, WBC 3000 with an ANC of 2000. 07/05/2018:  Hematocrit 30.8, hemoglobin 10.7, MCV 97.6, platelets 90,000, WBC 4400 with an ANC of 3100.  Magnesium was 1.3.  During the interim, patient has been having pain and swelling in her LEFT knee. Patient has been seen in follow up by orthopedics. Patient has a post-surgical joint effusion. Due to immunocompromised status, orthopedics is not wanting to remove any fluid.   Patient is having frequent "muddy and stinking" diarrhea. She is having 8-10 liquid stools a day. She is unable to hold her bowel movements. She has frequent "accidents" requiring clothing changes. Patient has a history of C.diff. She notes that her stools remind her of when she had C.diff before. She continues on Questran and Lomotil twice a day.   Patient denies that she has experienced any B symptoms. She denies any known interval infections. Patient advises that she maintains an adequate appetite. She is eating well. Weight today is 175 lb 3 oz (79.5 kg), which compared to her last visit to the clinic, represents a 9 pound increase.   Patient denies pain in the clinic today.   Past  Medical History:  Diagnosis Date  . Abnormal cervical Papanicolaou smear 09/18/2017  . Anxiety   . Aortic atherosclerosis ([place])   . Arthritis    neck and knees  . 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 Blood transfusion without reported diagnosis   . Cervical cancer (HCC)    mets lung  . Chronic anal fissure   . Chronic diarrhea   . Dyspnea   . Factor V Leiden mutation ([place])   . Fecal incontinence   . Genital warts   . GERD (gastroesophageal reflux disease)   . Heart murmur   . Hemorrhoids   . 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 History of cancer chemotherapy    completed 06/2017  . History of Clostridium difficile infection    while undergoing chemo.  Negative test 10/2017  . 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 Intestinal infection due to Clostridium difficile 09/18/2017  . Macrocytic anemia with vitamin B12 deficiency   . Perianal condylomata   . Pneumonia    History of  . Pulmonary nodules   . Rectal bleeding   . Small bowel obstruction ([place]) 08/2017  . Stiff neck    limited right turn  . Vitamin D deficiency     Past Surgical History:  Procedure Laterality Date  . CHOLECYSTECTOMY    . COLON SURGERY  08/2017   resection  .  COLONOSCOPY WITH PROPOFOL N/A 12/20/2017   Procedure: COLONOSCOPY WITH PROPOFOL;  Surgeon: [name], MD;  Location[facility] ENDOSCOPY;  Service: Gastroenterology;  Laterality: N/A;  . DIAGNOSTIC LAPAROSCOPY    . ESOPHAGOGASTRODUODENOSCOPY (EGD) WITH PROPOFOL N/A 12/20/2017   Procedure: ESOPHAGOGASTRODUODENOSCOPY (EGD) WITH PROPOFOL;  Surgeon: [name], MD;  Location: [facility];  Service: Gastroenterology;  Laterality: N/A;  . LAPAROTOMY N/A 08/31/2017   Procedure: EXPLORATORY LAPAROTOMY for SBO, ileocolectomy, removal of piece of uterine wall;  Surgeon: [name], MD;  Location: [facility] ORS;  Service: General;  Laterality: N/A;  . LASER ABLATION CONDOLAMATA N/A  02/22/2018   Procedure: LASER ABLATION/REMOVAL OF [id number] AROUND ANUS AND VAGINA;  Surgeon: Michael Boston, MD;  Location: [facility];  Service: General;  Laterality: N/A;  . OOPHORECTOMY    . PORTA CATH INSERTION N/A 05/13/2018   Procedure: PORTA CATH INSERTION;  Surgeon: Katha Cabal, MD;  Location: [facility];  Service: Cardiovascular;  Laterality: N/A;  . SMALL INTESTINE SURGERY    . TANDEM RING INSERTION     x3  . THORACOTOMY    . TOTAL KNEE ARTHROPLASTY Left 04/24/2018   Procedure: TOTAL KNEE ARTHROPLASTY;  Surgeon: Lovell Sheehan, MD;  Location: [facility] ORS;  Service: Orthopedics;  Laterality: Left;    Family History  Problem Relation Age of Onset  . Hypertension Father   . Diabetes Father   . Alcohol abuse Daughter   . Hypertension Maternal Grandmother   . Diabetes Maternal Grandmother   . Diabetes Paternal Grandmother   . Hypertension Paternal Grandmother     Social History:  reports that she quit smoking about 11 years ago. She has never used smokeless tobacco. She reports that she drinks alcohol. She reports that she has current or past drug history. Drug: Marijuana.  The patient's 6 year old daughter died of alcoholism.  The patient is originally from [place].  She moved from [place] to [place]. She is alone today.  Allergies:  Allergies  Allergen Reactions  . Ketamine Anxiety and Other (See Comments)    Syncope episode/confusion     Current Medications: Current Outpatient Medications  Medication Sig Dispense Refill  . Calcium Carb-Cholecalciferol (CALCIUM 500 +D) 500-400 MG-UNIT TABS Take 2 tablets by mouth daily.    . colestipol (COLESTID) 1 g tablet Take 2 tablets (2 g total) by mouth 2 (two) times daily. 60 tablet 0  . cyanocobalamin (,VITAMIN B-12,) 1000 MCG/ML injection Inject 1ML every week x 4 weeks, then 1ML every other week x 2 months, then monthly x 3 months (Patient taking differently: Inject 1,000 mcg  into the muscle every 30 (thirty) days. Inject 1ML every week x 4 weeks, then 1ML every other week x 2 months, then monthly x 3 months) 10 mL 1  . dicyclomine (BENTYL) 20 MG tablet Take 1 tablet (20 mg total) by mouth every 6 (six) hours. 20 tablet 0  . fluticasone (FLONASE) 50 MCG/ACT nasal spray Place 2 sprays into both nostrils daily. 16 g 0  . ibuprofen (ADVIL,MOTRIN) 600 MG tablet Take 1 tablet (600 mg total) by mouth every 8 (eight) hours as needed. 15 tablet 0  . lidocaine-prilocaine (EMLA) cream Apply 1 application topically as needed. Apply small amount to port site at least 1 hour prior to it being accessed, cover with plastic wrap 30 g 3  . loratadine (CLARITIN) 10 MG tablet Take 10 mg by mouth daily.    Marland Kitchen LORazepam (ATIVAN) 0.5 MG tablet  Take 1 tablet (0.5 mg total) by mouth every 6 (six) hours as needed (nausea). 30 tablet 0  . Magnesium Bisglycinate (MAG GLYCINATE) 100 MG TABS Take 100 mg by mouth daily. 30 tablet 3  . magnesium oxide (MAG-OX) 400 MG tablet Take 1 tablet (400 mg total) by mouth daily. 30 tablet 0  . ondansetron (ZOFRAN) 8 MG tablet Take 1 tablet (8 mg total) by mouth every 8 (eight) hours as needed for nausea or vomiting. 30 tablet 0  . oxyCODONE 10 MG TABS Take 1 tablet (10 mg total) by mouth every 4 (four) hours as needed for severe pain (pain score 7-10). 30 tablet 0  . oxyCODONE-acetaminophen (PERCOCET/ROXICET) 5-325 MG tablet Take 1 tablet by mouth every 4 (four) hours as needed for severe pain. 15 tablet 0  . promethazine (PHENERGAN) 25 MG tablet Take 1 tablet (25 mg total) by mouth every 6 (six) hours as needed for nausea or vomiting. 30 tablet 0  . rivaroxaban (XARELTO) 20 MG TABS tablet Take 1 tablet (20 mg total) by mouth daily with supper. 30 tablet 1  . sertraline (ZOLOFT) 50 MG tablet Take 1 tablet (50 mg total) by mouth daily. 30 tablet 3  . Syringe/Needle, Disp, (SYRINGE 3CC/22GX1") 22G X 1" 3 ML MISC For use with Vitamin B12 injections 50 each 0  .  traMADol (ULTRAM) 50 MG tablet Take 1 tablet (50 mg total) by mouth every 6 (six) hours as needed. 60 tablet 0  . VENTOLIN HFA 108 (90 Base) MCG/ACT inhaler Inhale 1-2 puffs into the lungs every 4 (four) hours as needed for shortness of breath.   0   No current facility-administered medications for this visit.    Facility-Administered Medications Ordered in Other Visits  Medication Dose Route Frequency Provider Last Rate Last Dose  . heparin lock flush 100 unit/mL  500 Units Intravenous Once Corcoran, Melissa C, MD      . heparin lock flush 100 unit/mL  500 Units Intravenous Once Corcoran, Melissa C, MD      . sodium chloride flush (NS) 0.9 % injection 10 mL  10 mL Intravenous PRN Jacquelin Hawking, NP   10 mL at 05/23/18 1225  . sodium chloride flush (NS) 0.9 % injection 10 mL  10 mL Intravenous PRN Lequita Asal, MD   10 mL at 06/10/18 0850    Review of Systems  Constitutional: Negative for diaphoresis, fever, malaise/fatigue and weight loss (9 pound gain).  HENT: Negative.   Eyes: Negative.   Respiratory: Positive for shortness of breath (exertional). Negative for cough, hemoptysis and sputum production.   Cardiovascular: Negative for chest pain, palpitations, orthopnea, leg swelling and PND.  Gastrointestinal: Positive for diarrhea ("muddy and stinking"; PMH (+) for C.diff). Negative for abdominal pain, blood in stool, constipation, melena, nausea and vomiting.  Genitourinary: Negative for dysuria, frequency, hematuria and urgency.  Musculoskeletal: Positive for joint pain (s/p left TKR. Current RIGHT knee effusion.  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 Negative for back pain and falls.  Skin: Negative for itching and rash.  Neurological: Negative for dizziness, tremors, weakness and headaches.  Endo/Heme/Allergies: Does not bruise/bleed easily.  Psychiatric/Behavioral: Negative for depression, memory loss and suicidal ideas. The patient is not  nervous/anxious and does not have insomnia.   All other systems reviewed and are negative.  Performance status (ECOG): 1 - 2  Vital Signs BP 105/70 (BP Location: Left Arm, Patient Position: Sitting)   Pulse 73   Temp 97.6  F (36.4 C) (Tympanic)   Resp 18   Wt 175 lb 3 oz (79.5 kg)   BMI 27.44 kg/m   Physical Exam  Constitutional: She is oriented to person, place, and time and well-developed, well-nourished, and in no distress.  Slightly fatigued appearing.  HENT:  Head: Normocephalic and atraumatic. Hair is normal.  Wearing a pink cap.  Alopecia.  Eyes: Pupils are equal, round, and reactive to light. EOM are normal. No scleral icterus.  Blue eyes.  Neck: Normal range of motion. Neck supple. No tracheal deviation present. No thyromegaly present.  Cardiovascular: Normal rate, regular rhythm and normal heart sounds. Exam reveals no gallop and no friction rub.  No murmur heard. Pulmonary/Chest: Effort normal and breath sounds normal. No respiratory distress. She has no wheezes. She has no rales.  Abdominal: Soft. Bowel sounds are normal. She exhibits no distension. There is tenderness (Slightly tender lower abdomen without guarding or rebound tenderness).  Musculoskeletal: Normal range of motion. She exhibits no edema or tenderness.  Lymphadenopathy:    She has no cervical adenopathy.    She has no axillary adenopathy.       Right: No inguinal and no supraclavicular adenopathy present.       Left: No inguinal and no supraclavicular adenopathy present.  Neurological: She is alert and oriented to person, place, and time.  Skin: Skin is warm and dry. No rash noted. No erythema.  Psychiatric: Mood, affect and judgment normal.  Nursing note and vitals reviewed.   Imaging studies: 06/2017:  PET scan revealed enlarging pulmonary nodules but no evidence of abdominal disease per patient report. 09/13/2017:  Abdomen and pelvic CT revealed a small infarct in the inferior pole of the LEFT  kidney and RIGHT uterine infarct. 11/08/2017:  PET scan at the [place] [facility] revealed innumerable stable bilateral cavitary pulmonary nodules (up to 8 mm), some of which demonstrate low level metabolic activity.  There were post treatment changes in the pelvis without evidence of suspicious hypermetabolic activity. 12/03/2017:  Abdomen and pelvic CT revealed continued multiple pulmonary nodules in the lung bases concerning for metastatic disease.  There was mild wall thickening of rectosigmoid colon is noted.  There was a stable 1.8 x 1.6 cm mixed fat and soft tissue density noted in right lower quadrant which may represent fat necrosis.  There was a larger low density is noted involving uterine fundus. 12/03/2017:  Pelvic MRI was unremarkable.   04/15/2018:  Chest, abdomen, and pelvis CT revealed innumerable (> 100) cavitary nodules scattered in the lungs, moderately enlarging compared to the 11/08/2017 PET-CT, suspicious for metastatic disease.  One index node in the RLL measures 1.0 x 1.1 cm (previously 0.6 x 0.6 cm).  There were no new nodules.  There was an ill-defined wall thickening in the rectosigmoid with surrounding stranding along fascia planes, probably sequela from prior radiation therapy.  There was multilevel lumbar impingement due to spondylosis and degenerative disc disease.  There was heterogeneous enhancement in the uterus (some possibly from prior radiation therapy). 04/23/2018:  PET scan revealed numerous scattered solid and cavitary nodules in the lungs stable increased in size compared to the prior PET-CT from 11/08/2017. Largest nodule was 1.1 cm in the LUL (SUV 1.9).  These demonstrated low-grade metabolic activity up to a maximum SUV of about 2.3, increased from 11/08/2017.    Infusion on 07/08/2018  Component Date Value Ref Range Status  . Magnesium 07/08/2018 1.3* 1.7 - 2.4 mg/dL Final   Performed at [facility]  [place], [address] [place],  [place] [address]  . Sodium 07/08/2018 136  135 - 145 mmol/L Final  . Potassium 07/08/2018 4.2  3.5 - 5.1 mmol/L Final  . Chloride 07/08/2018 107  98 - 111 mmol/L Final  . CO2 07/08/2018 22  22 - 32 mmol/L Final  . Glucose, Bld 07/08/2018 119* 70 - 99 mg/dL Final  . BUN 07/08/2018 15  6 - 20 mg/dL Final  . Creatinine, Ser 07/08/2018 0.71  0.44 - 1.00 mg/dL Final  . Calcium 07/08/2018 8.7* 8.9 - 10.3 mg/dL Final  . GFR calc non Af Amer 07/08/2018 >60  >60 mL/min Final  . GFR calc Af Amer 07/08/2018 >60  >60 mL/min Final   Comment: (NOTE) The eGFR has been calculated using the CKD EPI equation. This calculation has not been validated in all clinical situations. eGFR's persistently <60 mL/min signify possible Chronic Kidney Disease.   Georgiann Hahn gap 07/08/2018 7  5 - 15 Final   Performed at [facility], [address] [place], [place] [address]  . WBC 07/08/2018 4.3  3.6 - 11.0 K/uL Final  . RBC 07/08/2018 2.79* 3.80 - 5.20 MIL/uL Final  . Hemoglobin 07/08/2018 9.5* 12.0 - 16.0 g/dL Final  . HCT 07/08/2018 27.5* 35.0 - 47.0 % Final  . MCV 07/08/2018 98.5  80.0 - 100.0 fL Final  . MCH 07/08/2018 33.9  26.0 - 34.0 pg Final  . MCHC 07/08/2018 34.4  32.0 - 36.0 g/dL Final  . RDW 07/08/2018 14.1  11.5 - 14.5 % Final  . Platelets 07/08/2018 81* 150 - 440 K/uL Final  . Neutrophils Relative % 07/08/2018 76  % Final  . Neutro Abs 07/08/2018 3.2  1.4 - 6.5 K/uL Final  . Lymphocytes Relative 07/08/2018 18  % Final  . Lymphs Abs 07/08/2018 0.8* 1.0 - 3.6 K/uL Final  . Monocytes Relative 07/08/2018 5  % Final  . Monocytes Absolute 07/08/2018 0.2  0.2 - 0.9 K/uL Final  . Eosinophils Relative 07/08/2018 1  % Final  . Eosinophils Absolute 07/08/2018 0.1  0 - 0.7 K/uL Final  . Basophils Relative 07/08/2018 0  % Final  . Basophils Absolute 07/08/2018 0.0  0 - 0.1 K/uL Final   Performed at [facility], [address] [place], [place] [address]    Assessment:  Joanna Hall is a 52 y.o.  female with a history of stage IB1 cervical cancer s/p concurrent cisplatin and radiation (external beam and brachytherapy) from 11/2016 - 02/2017.    She was treated by Dr. Christene Slates at [facility] in [place], [place].  In 01/2017, she received cisplatin x 2 and carboplatin x 1 (01/29/2017) due to ARF and XRT. Radiation was followed by T & O on 02/01/2017 and T & N on 02/10/2017 and 02/20/2017.  Course was complicated by weight loss (80 pounds), nausea, vomiting, electrolyte wasting (potassium and magnesium), and multiple hospitalizations.  PET scan in 06/2017 revealed enlarging pulmonary nodules but no evidence of abdominal disease per patient report.  She is s/p right ileocolectomy on 08/31/2017 for a small bowel obstruction.  She presented with abdominal pain.  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 PDL-1 testing reveals a CPS score of 15.  Chest, abdomen, and pelvis CT on 04/15/2018 revealed innumerable (> 100) cavitary nodules scattered in the lungs, moderately enlarging  compared to the 11/08/2017 PET-CT, suspicious for metastatic disease. One index node in the RLL measures 1.0 x 1.1 cm.  There were no new nodules.  There was an ill-defined wall thickening in the rectosigmoid with surrounding stranding along fascia planes, probably sequela from prior radiation therapy.  There was multilevel lumbar impingement due to spondylosis and degenerative disc disease.  There was heterogeneous enhancement in the uterus (some possibly from prior radiation therapy).  PET scan on 04/23/2018 revealed numerous scattered solid and cavitary nodules in the lungs stable increased in size compared to the prior PET-CT from 11/08/2017. Largest nodule was 1.1 cm in the LUL (SUV 1.9).  These demonstrated low-grade metabolic activity up to a maximum SUV  of about 2.3, increased from 11/08/2017.   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  She is day 14 s/p 2 cycle of carboplatin and Taxol (06/04/2018 - 06/25/2018) with Margarette Canada support.  Counts were adequate.  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  Testing was negative for prothrombin gene mutation, lupus anticoagulant panel, anticardiolipin antibodies, and beta-2 glycoprotein antibodies.  She was discharged on Xarelto.  She was admitted to [facility] from 12/03/2017 - 12/08/2017 with abdominal pain. She was not felt to have a uterine infarct.  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  The etiology was unclear.  She is hepatitis C positive.   Hepatitis C genotype is 2a/2c.  She has B12 deficiency.  B12 was 165 on 11/27/2017.  She began B12 injections on 11/29/2017.  B12 injections occur monthly at home.  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  She was diagnosed with colitis.  Abdomen and pelvic CT on 05/23/2018 revealed colonic wall thickening, consistent with colitis. Considerations include inflammatory or infectious causes. Ischemic causes would be less likely. She is s/p RIGHT hemicolectomy. She had a patent ileocolic anastomosis.  She received a 10 day course of ciprofloxacin and Flagyl.  She has  chronic diarrhea s/p right hemicolectomy.  She is Questran and Lomotil.  Symptomatically, patient experiencing increase diarrhea. She describes her stools as "muddy and stinking". She remains on Questran and Lomotil. No B symptoms. Exam is grossly unremarkable.  WBC 4300 ([place] 3200).  Platelets 81,000.  Potassium normal at 4.2.  AST 26, ALT 33, ALP 132, and total bilirubin 0.4.  Magnesium normal at 1.3.  Plan:   1. Labs today: CBC with diff, BMP, Mg 2. Cervical cancer:  Tolerating treatments well.  Patient does not feel like chronic diarrhea worsened by chemotherapy.  Counts stable; WBC 4300 ([place] 3200).  Platelets 81,000.  No plan to increase dose with upcoming cycle. Discuss symptom management.  Patient has antiemetics and pain medications at home to use on a PRN basis. Patient  advising that the  prescribed interventions are adequate at this point. Continue all medications as previously prescribed.  3. Transaminitis  Improving. AST 26, ALT 33, ALP 132, and total bilirubin 0.4.Marland Kitchen  Continue to monitor.  4. Diarrhea  Chronic bowel issues following partial colectomy.  Check C.diff and GI panel   Worsening on despite Questran and Lomotil BID.   Encouraged to increase intake of electrolyte contain fluids to prevent dehydration.  Discuss follow-up with Dr. Marius Ditch, gastroenterologist. 5. Electrolyte derangements  Discuss low magnesium. Magnesium 1.3 today. Will replace with 4 grams intravenous magnesium today.   Discontinue oral magnesium glycinate.  Previously did not affect stool output, but will hold  for now to ensure not contributing.   All other electrolytes reviewed as normal.  6. B12 deficiency  Continues on monthly parenteral B12 supplementation. Patient's sister administers injections at home (last 06/25/2018).  7. RTC on 07/12/2018 for labs (CBC with diff, CMP, Mg), and +/- IV magnesium.  8. RTC on 07/16/2018 for MD assessment, labs (CBC with diff, CMP, Mg), and cycle #3 paclitaxel + carboplatin with Udencya support.    ADDENDUM: GI panel and C.diff studies negative for diarrhea of infectious etiology. Referred back to gastroenterology. Patient to see Dr. Marius Ditch in follow up on 07/09/2018 at 0830.   Honor Loh, NP  07/08/2018, 11:45 AM   I saw and evaluated the patient, participating in the key portions of the service and reviewing pertinent diagnostic studies and records.  I reviewed the nurse practitioner's note and agree with the findings and the plan.  The assessment  and plan were discussed with the patient.  Multiple questions were asked by the patient and answered.   Nolon Stalls, MD 07/08/2018,11:45 AM

## 2018-07-08 NOTE — Progress Notes (Signed)
Patient here today as acute add on for mag infusion.  Patient continues to have diarrhea.  States she is having bilateral leg pain.

## 2018-07-09 ENCOUNTER — Encounter: Payer: Self-pay | Admitting: Gastroenterology

## 2018-07-09 ENCOUNTER — Ambulatory Visit: Payer: Medicaid Other | Admitting: Gastroenterology

## 2018-07-09 VITALS — BP 134/84 | HR 71 | Resp 17 | Wt 172.8 lb

## 2018-07-09 DIAGNOSIS — K529 Noninfective gastroenteritis and colitis, unspecified: Secondary | ICD-10-CM | POA: Diagnosis not present

## 2018-07-09 MED ORDER — DIPHENOXYLATE-ATROPINE 2.5-0.025 MG PO TABS: 2.0000 | ORAL_TABLET | Freq: Four times a day (QID) | ORAL | 1 refills | Status: AC

## 2018-07-09 NOTE — Progress Notes (Signed)
Joanna Darby, MD [address]  [place], [place] [address]  Main: [phone]  Fax: [phone]    Gastroenterology Consultation  Referring Provider:     Arnetha Courser, MD Primary Care Physician:  Joanna Courser, MD Primary Gastroenterologist:  Dr. Cephas Hall Reason for Consultation:     Chronic diarrhea and lower abdominal pain        HPI:   Joanna Hall is a 53 y.o. female referred by Dr. Hampton Hall for consultation & management of and chronic unexplained diarrhea. Patient has history of cervical cancer underwent chemotherapy XRT and brachytherapy, appears to be stable, closely followed by oncology at [facility]. She had small bowel obstruction in 08/2017, underwent exploratory laparoscopy with ileocolectomy and primary anastomosis. 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 Currently on Xarelto  Patient reports that since her bowel resection, she has been experiencing several episodes of nonbloody diarrhea, up to 12 times per day and night associated with urgency, nocturnal diarrhea as well as fecal incontinence. She does report diffuse lower abdominal cramps as well as intermittent gas/bloating.patient reports that she may have seen worms in her stools on several occasions. She does drink well water. She denies recent travel outside the [place] or recent use of antibiotics. She did have C. Difficile infection in the past when she was undergoing chemotherapy for cervical cancer. She had stool studies for C. Difficile and other GI pathogens including ova and parasites in 10/2017 by Dr. Hampton Hall and they were negative.She has tried Imodium and Lomotil with no relief. Therefore she was referred to GI by Dr. Hampton Hall for further management. Patient denies any particular relation to food. Prior to her surgery, patient reports having  alternating episodes of diarrhea and constipation associated with abdominal cramps and bloating. She would have up to 2-3 soft bowel movements daily but not this severe.her weight has been stable. She denies nausea, vomiting, fever, night sweats. She denies rectal bleeding. She is found to have macrocytic anemia which has been stable. She is not on any B12 or iron supplements.   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 She does not have any evidence of chronic liver disease.  Follow-up visit 12/12/2017 Patient was admitted to the hospital early this month secondary to lower abdominal pain, CT revealed mild rectosigmoid thickening. She reports that her diarrhea resolved after initiation of cholestyramine. She is currently taking 4 g twice daily. Having one bowel movement daily. She denies rectal bleeding. Her anal fissure had also significantly improved since control of her diarrhea. She has not received topical nitroglycerin ointment. However, she continues to have lower abdominal pain, constant with no relation to food or bowel movements. She tried dicyclomine which did not help. She is seeing Dr. Mike Hall for follow-up of lung nodules and is scheduled to have a CT scan in 02/2018. Her disease has not progressed to date. She also has B12 deficiency for which she is taking B12 injections. She stopped taking NSAIDs  Follow-up visit 02/11/2018 Patient was at her weight has been stable. She feels like she  is dependent on Bentyl and cholestyramine for her diarrhea and abdominal pain. Her diarrhea recurs if she does not take cholestyramine. She takes 2 packets daily which in fact leads to constipation. Bentyl  helps with her chronic lower abdominal cramps. Medication for hepatitis C treatment has been approved, and waiting for the medicine to be shipped. She underwent colonoscopy which showed anal condylomata.  Follow up visit 05/31/2018 Patient underwent left knee replacement at [facility] on 04/24/2018. She also had laser ablation of anal condylomata on 02/22/2018 by Dr. Johney Maine. Both procedures went well. 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 Patient was started on chemotherapy on 05/15/2018. She developed abdominal pain with nausea, vomiting and diarrhea for last 2 weeks. She underwent CT abdomen and pelvis which revealed thickening of the colon, underwent stool studies including C. Difficile and other GI pathogen panel which came back negative. Patient is started on ciprofloxacin 3 days ago. I got a call from Joanna Purpura, NP from [place] yesterday about her ongoing diarrhea. I recommended to increase cholestyramine to 3 times daily and add Lomotil. Patient is here as a urgent follow-up of her diarrhea  She took third dose of cholestyramine and took 1 dose of Lomotil last night and she had 1 soft bowel movement this morning. She thinks her diarrhea has slowed down. He otherwise denies any other GI symptoms  Follow-up visit 07/09/2018 Restarted chemotherapy for cervical cancer. She reports that her diarrhea recurred after starting chemotherapy. She reports lower abdominal cramps as well. Currently, experiencing very loose, muddy like bowel movements several times a day and sometimes at night. She is on cholestyramine 2 packets daily, Imodium twice daily, Lomotil twice daily. She had repeat stool studies by Dr. Mike Hall on and negative for infection. Her weight has been stable, she denies nausea,  appetite is intact. She was also taking oral magnesium which has been just stopped  She denies smoking or drinking alcohol.   NSAIDs: Has stopped taking NSAIDs  Antiplts/Anticoagulants/Anti thrombotics: on Xarelto for factor V bleeding mutation  GI Procedures: EGD 12/30/2017 - Normal duodenal bulb and second portion of the duodenum. - Erythematous mucosa in the stomach. Biopsied. - Normal gastroesophageal junction and esophagus.  Colonoscopy 12/30/2017 - Perianal condylomata found on perianal exam. - Patent end-to-side ileo-colonic anastomosis, characterized by healthy appearing mucosa. - The examined portion of the ileum was normal. Biopsied. - Multiple non-bleeding colonic angioectasias. - Normal mucosa in the entire examined colon. Biopsied. DIAGNOSIS:  A. STOMACH, ANTRUM; COLD BIOPSY:  - MILD CHRONIC GASTRITIS AND REGENERATIVE/REPARATIVE CHANGE.  - NEGATIVE FOR H. PYLORI, DYSPLASIA AND MALIGNANCY.   B. STOMACH, BODY; COLD BIOPSY:  - MINIMAL CHRONIC GASTRITIS AND REGENERATIVE/REPARATIVE CHANGE.  - NEGATIVE FOR H. PYLORI, DYSPLASIA AND MALIGNANCY.   Note: The histologic findings in the antrum and body resemble reactive  gastropathy. Etiologies include drugs/chemical injury (NSAIDs vs.  other), bile reflux, and changes adjacent to an area of healing  ulceration. Clinical correlation with endoscopic findings is required.   C. TERMINAL ILEUM; COLD BIOPSY:  - PARTIALLY FRAGMENTED SMALL BOWEL MUCOSA WITH OVERALL INTACT VILLOUS  ARCHITECTURE.  - NEGATIVE FOR INTRAEPITHELIAL LYMPHOCYTOSIS, DYSPLASIA AND MALIGNANCY.   D. RANDOM COLON; COLD BIOPSY:  - COLONIC MUCOSA NEGATIVE FOR MICROSCOPIC COLITIS, DYSPLASIA AND  MALIGNANCY.   Colonoscopy at [facility] 10/05/2016 Impression:           - One diminutive  polyp in the cecum,                        removed with a cold biopsy forceps.                        Resected and retrieved.                       - One 5 mm polyp in the cecum,                          removed with a jumbo cold forceps.                        Resected and retrieved.                       - One 6 mm polyp in the cecum,                        removed with a hot snare. Resected                        and retrieved.                       - One 5 mm polyp in the descending                        colon, removed with a jumbo cold                        forceps. Resected and retrieved. Date Collected: 10/05/2016 14:48 Diagnosis A.COLON, LABELED AS "CECAL POLYP", BIOPSY:  TUBULAR ADENOMA  SESSILE SERRATED ADENOMA  B.COLON, LABELED AS "TRANSVERSE COLON POLYP", BIOPSY:  TUBULAR ADENOMA  She denies family history of celiac disease, autoimmune conditions, IBD, GI malignancy  Past Medical History:  Diagnosis Date  . Abnormal cervical Papanicolaou smear 09/18/2017  . Anxiety   . Aortic atherosclerosis ([place])   . Arthritis    neck and knees  . 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 Blood transfusion without reported diagnosis   . Cervical cancer (HCC)    mets lung  . Chronic anal fissure   . Chronic diarrhea   . Dyspnea   . Factor V Leiden mutation ([place])   . Fecal incontinence   . Genital warts   . GERD (gastroesophageal reflux disease)   . Heart murmur   . Hemorrhoids   . 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 History of cancer chemotherapy    completed 06/2017  . History of Clostridium difficile infection    while undergoing chemo.  Negative test 10/2017  . 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 Intestinal infection due to Clostridium difficile 09/18/2017  . Macrocytic anemia with vitamin B12 deficiency   . Perianal condylomata   . Pneumonia    History of  . Pulmonary nodules   . Rectal bleeding   . Small bowel obstruction ([place]) 08/2017  . Stiff neck    limited right turn  . Vitamin D deficiency     Past Surgical History:  Procedure  Laterality Date  .  CHOLECYSTECTOMY    . COLON SURGERY  08/2017   resection  . COLONOSCOPY WITH PROPOFOL N/A 12/20/2017   Procedure: COLONOSCOPY WITH PROPOFOL;  Surgeon: [name], MD;  Location: [facility] ENDOSCOPY;  Service: Gastroenterology;  Laterality: N/A;  . DIAGNOSTIC LAPAROSCOPY    . ESOPHAGOGASTRODUODENOSCOPY (EGD) WITH PROPOFOL N/A 12/20/2017   Procedure: ESOPHAGOGASTRODUODENOSCOPY (EGD) WITH PROPOFOL;  Surgeon: [name], MD;  Location: [place];  Service: Gastroenterology;  Laterality: N/A;  . LAPAROTOMY N/A 08/31/2017   Procedure: EXPLORATORY LAPAROTOMY for SBO, ileocolectomy, removal of piece of uterine wall;  Surgeon: [name], MD;  Location: [facility] ORS;  Service: General;  Laterality: N/A;  . LASER ABLATION CONDOLAMATA N/A 02/22/2018   Procedure: LASER ABLATION/REMOVAL OF GNFAOZHYQMV AROUND ANUS AND VAGINA;  Surgeon: Michael Boston, MD;  Location: [place];  Service: General;  Laterality: N/A;  . OOPHORECTOMY    . PORTA CATH INSERTION N/A 05/13/2018   Procedure: PORTA CATH INSERTION;  Surgeon: Katha Cabal, MD;  Location: [place] CV LAB;  Service: Cardiovascular;  Laterality: N/A;  . SMALL INTESTINE SURGERY    . TANDEM RING INSERTION     x3  . THORACOTOMY    . TOTAL KNEE ARTHROPLASTY Left 04/24/2018   Procedure: TOTAL KNEE ARTHROPLASTY;  Surgeon: Lovell Sheehan, MD;  Location: [facility] ORS;  Service: Orthopedics;  Laterality: Left;    Current Outpatient Medications:  .  Calcium Carb-Cholecalciferol (CALCIUM 500 +D) 500-400 MG-UNIT TABS, Take 2 tablets by mouth daily., Disp: , Rfl:  .  colestipol (COLESTID) 1 g tablet, Take 2 tablets (2 g total) by mouth 2 (two) times daily., Disp: 60 tablet, Rfl: 0 .  cyanocobalamin (,VITAMIN B-12,) 1000 MCG/ML injection, Inject 1ML every week x 4 weeks, then 1ML every other week x 2 months, then monthly x 3 months (Patient taking differently: Inject 1,000 mcg into the muscle every 30 (thirty) days. Inject 1ML every  week x 4 weeks, then 1ML every other week x 2 months, then monthly x 3 months), Disp: 10 mL, Rfl: 1 .  dicyclomine (BENTYL) 20 MG tablet, Take 1 tablet (20 mg total) by mouth every 6 (six) hours., Disp: 20 tablet, Rfl: 0 .  fluticasone (FLONASE) 50 MCG/ACT nasal spray, Place 2 sprays into both nostrils daily., Disp: 16 g, Rfl: 0 .  loratadine (CLARITIN) 10 MG tablet, Take 10 mg by mouth daily., Disp: , Rfl:  .  ondansetron (ZOFRAN) 8 MG tablet, Take 1 tablet (8 mg total) by mouth every 8 (eight) hours as needed for nausea or vomiting., Disp: 30 tablet, Rfl: 0 .  promethazine (PHENERGAN) 25 MG tablet, Take 1 tablet (25 mg total) by mouth every 6 (six) hours as needed for nausea or vomiting., Disp: 30 tablet, Rfl: 0 .  rivaroxaban (XARELTO) 20 MG TABS tablet, Take 1 tablet (20 mg total) by mouth daily with supper., Disp: 30 tablet, Rfl: 1 .  sertraline (ZOLOFT) 50 MG tablet, Take 1 tablet (50 mg total) by mouth daily., Disp: 30 tablet, Rfl: 3 .  Syringe/Needle, Disp, (SYRINGE 3CC/22GX1") 22G X 1" 3 ML MISC, For use with Vitamin B12 injections, Disp: 50 each, Rfl: 0 .  VENTOLIN HFA 108 (90 Base) MCG/ACT inhaler, Inhale 1-2 puffs into the lungs every 4 (four) hours as needed for shortness of breath. , Disp: , Rfl: 0 .  diphenoxylate-atropine (LOMOTIL) 2.5-0.025 MG tablet, Take 2 tablets by mouth 4 (four) times daily., Disp: 240 tablet, Rfl: 1 .  ibuprofen (  ADVIL,MOTRIN) 600 MG tablet, Take 1 tablet (600 mg total) by mouth every 8 (eight) hours as needed. (Patient not taking: Reported on 07/09/2018), Disp: 15 tablet, Rfl: 0 .  oxyCODONE-acetaminophen (PERCOCET/ROXICET) 5-325 MG tablet, Take 1 tablet by mouth every 4 (four) hours as needed for severe pain. (Patient not taking: Reported on 07/09/2018), Disp: 15 tablet, Rfl: 0 No current facility-administered medications for this visit.   Facility-Administered Medications Ordered in Other Visits:  .  heparin lock flush 100 unit/mL, 500 Units, Intravenous,  Once, Corcoran, Melissa C, MD .  sodium chloride flush (NS) 0.9 % injection 10 mL, 10 mL, Intravenous, PRN, Faythe Casa E, NP, 10 mL at 05/23/18 1225 .  sodium chloride flush (NS) 0.9 % injection 10 mL, 10 mL, Intravenous, PRN, Joanna Hall, Melissa C, MD, 10 mL at 06/10/18 0850   Family History  Problem Relation Age of Onset  . Hypertension Father   . Diabetes Father   . Alcohol abuse Daughter   . Hypertension Maternal Grandmother   . Diabetes Maternal Grandmother   . Diabetes Paternal Grandmother   . Hypertension Paternal Grandmother      Social History   Tobacco Use  . Smoking status: Former Smoker    Last attempt to quit: 10/16/2006    Years since quitting: 11.7  . Smokeless tobacco: Never Used  Substance Use Topics  . Alcohol use: Yes    Frequency: Never    Comment: seldom  . Drug use: Yes    Types: Marijuana    Allergies as of 07/09/2018 - Review Complete 07/09/2018  Allergen Reaction Noted  . Ketamine Anxiety and Other (See Comments) 02/22/2017    Review of Systems:    All systems reviewed and negative except where noted in HPI.   Physical Exam:  BP 134/84 (BP Location: Left Arm, Patient Position: Sitting, Cuff Size: Normal)   Pulse 71   Resp 17   Wt 172 lb 12.8 oz (78.4 kg)   BMI 27.06 kg/m  No LMP recorded. Patient is postmenopausal.  General:   Alert,  Well-developed, well-nourished, pleasant and cooperative in NAD Head:  Normocephalic and atraumatic. Eyes:  Sclera clear, no icterus.   Conjunctiva pink. Ears:  Normal auditory acuity. Nose:  No deformity, discharge, or lesions. Mouth:  No deformity or lesions,oropharynx pink & moist. Neck:  Supple; no masses or thyromegaly. Lungs:  Respirations even and unlabored.  Clear throughout to auscultation.   No wheezes, crackles, or rhonchi. No acute distress. Heart:  Regular rate and rhythm; no murmurs, clicks, rubs, or gallops. Abdomen:  Normal bowel sounds. Soft,mild diffuse lower abdominal tenderness, and  non-distended without masses, hepatosplenomegaly or hernias noted.  No guarding or rebound tenderness.   Rectal: Not performed Msk:  Symmetrical without gross deformities. Good, equal movement & strength bilaterally. Pulses:  Normal pulses noted. Extremities:  No clubbing or edema.  No cyanosis. Neurologic:  Alert and oriented x3;  grossly normal neurologically. Skin:  Intact without significant lesions or rashes. No jaundice. Lymph Nodes:  No significant cervical adenopathy. Psych:  Alert and cooperative. Normal mood and affect.  Imaging Studies: reviewed  Assessment and Plan:   CHARESE ABUNDIS is a 52 y.o. female with history of chronic hepatitis C, treatment nave, cervical adenocarcinoma, status post chemotherapy XRT and brachytherapy, disease currently stable, persistent small bowel obstruction, S/P exploratory laparoscopy with ileocolectomy in 08/2017 seen in follow-up for chronic hepatitis C, chronic unexplained diarrhea, chronic anal fissure as well as symptomatic hemorrhoids  Chronic hep C: Genotype 2a/2c, VL  82,400  Start treatment of hepatitis C with Mavyret for 8 weeks, Patient is waiting to finish next cycle of chemotherapy so that the antiviral treatment is uninterrupted There is no evidence of chronic liver disease She had alkaline phosphatase Isoenzymes and liver component is elevated, GGT 209, elevated Hepatitis B surface antigen negative HIV nonreactive Check hepatitis B surface antibody, core antibody negative Other secondary causes for elevated LFTs are unremarkable  Chronic nonbloody diarrhea: Her history is consistent with bile salt induced diarrhea with history of ileocolectomy. Pancreatic fecal elastase, fecal calprotectin are normal, CRP came back normal. No evidence of microscopic colitis. Worsening of diarrhea after chemotherapy and partly attributed to oral magnesium supplements  - Increase cholestyramine to 4 g 3 times daily with meals - Dicyclomine for  abdominal cramps   - started Lomotil 2 tablets every 6 hours as needed - educated patient how to titrate the dose of cholestyramine and Lomotil based on her symptoms of diarrhea  Bilateral lower abdominal pain: CT revealed mild thickening of the rectosigmoid colon Her abdominal pain is most likely secondary to scar tissue - colonoscopy revealed mild radiation proctitis  Chronic anal fissure and symptomatic hemorrhoids: - Her fissure has significantly improved - I will defer outpatient hemorrhoid ligation for now - she underwent removal of anal condylomata  B12 deficiency anemia: - Continue B12 injections as recommended - repeat vitamin B12 levels normal - there is no evidence of autoimmune gastritis/pernicious anemia based on EGD - Negative antiparietal cell antibodies, intrinsic factor antibodies   Follow up in 3 months or sooner if diarrhea persists  Joanna Darby, MD

## 2018-07-12 ENCOUNTER — Inpatient Hospital Stay: Payer: Medicaid Other

## 2018-07-12 ENCOUNTER — Encounter: Payer: Self-pay | Admitting: Hematology and Oncology

## 2018-07-12 ENCOUNTER — Other Ambulatory Visit: Payer: Self-pay | Admitting: Urgent Care

## 2018-07-12 DIAGNOSIS — C7800 Secondary malignant neoplasm of unspecified lung: Secondary | ICD-10-CM

## 2018-07-12 DIAGNOSIS — Z5111 Encounter for antineoplastic chemotherapy: Secondary | ICD-10-CM | POA: Diagnosis not present

## 2018-07-12 LAB — COMPREHENSIVE METABOLIC PANEL
ALT: 50 U/L — ABNORMAL HIGH (ref 0–44)
AST: 37 U/L (ref 15–41)
Albumin: 4 g/dL (ref 3.5–5.0)
Alkaline Phosphatase: 153 U/L — ABNORMAL HIGH (ref 38–126)
Anion gap: 8 (ref 5–15)
BUN: 13 mg/dL (ref 6–20)
CO2: 24 mmol/L (ref 22–32)
Calcium: 9.6 mg/dL (ref 8.9–10.3)
Chloride: 104 mmol/L (ref 98–111)
Creatinine, Ser: 0.72 mg/dL (ref 0.44–1.00)
GFR calc Af Amer: >60 mL/min (ref >60)
GFR calc non Af Amer: >60 mL/min (ref >60)
Glucose, Bld: 107 mg/dL — ABNORMAL HIGH (ref 70–99)
Potassium: 4.4 mmol/L (ref 3.5–5.1)
Sodium: 136 mmol/L (ref 135–145)
Total Bilirubin: 0.5 mg/dL (ref 0.3–1.2)
Total Protein: 7.7 g/dL (ref 6.5–8.1)

## 2018-07-12 LAB — CBC WITH DIFFERENTIAL/PLATELET
Basophils Absolute: 0 10*3/uL (ref 0–0.1)
Basophils Relative: 0 %
Eosinophils Absolute: 0 10*3/uL (ref 0–0.7)
Eosinophils Relative: 0 %
HCT: 33.9 % — ABNORMAL LOW (ref 35.0–47.0)
Hemoglobin: 11.6 g/dL — ABNORMAL LOW (ref 12.0–16.0)
Lymphocytes Relative: 21 %
Lymphs Abs: 0.8 10*3/uL — ABNORMAL LOW (ref 1.0–3.6)
MCH: 33.6 pg (ref 26.0–34.0)
MCHC: 34.2 g/dL (ref 32.0–36.0)
MCV: 98.1 fL (ref 80.0–100.0)
Monocytes Absolute: 0.2 10*3/uL (ref 0.2–0.9)
Monocytes Relative: 6 %
Neutro Abs: 3 10*3/uL (ref 1.4–6.5)
Neutrophils Relative %: 73 %
Platelets: 114 10*3/uL — ABNORMAL LOW (ref 150–440)
RBC: 3.46 MIL/uL — ABNORMAL LOW (ref 3.80–5.20)
RDW: 15.1 % — ABNORMAL HIGH (ref 11.5–14.5)
WBC: 4.1 10*3/uL (ref 3.6–11.0)

## 2018-07-12 LAB — MAGNESIUM: Magnesium: 1.5 mg/dL — ABNORMAL LOW (ref 1.7–2.4)

## 2018-07-12 MED ORDER — HEPARIN SOD (PORK) LOCK FLUSH 100 UNIT/ML IV SOLN
500.0000 [IU] | Freq: Once | INTRAVENOUS | Status: AC
  Administered 2018-07-12: 500 [IU] via INTRAVENOUS
  Filled 2018-07-12: qty 5

## 2018-07-12 MED ORDER — TRAMADOL HCL 50 MG PO TABS: 25.0000 mg | ORAL_TABLET | Freq: Four times a day (QID) | ORAL | 0 refills | Status: DC | PRN

## 2018-07-12 MED ORDER — SODIUM CHLORIDE 0.9 % IV SOLN: 2.0000 g | Freq: Once | INTRAVENOUS | Status: DC

## 2018-07-12 MED ORDER — MAGNESIUM SULFATE 2 GM/50ML IV SOLN
2.0000 g | Freq: Once | INTRAVENOUS | Status: AC
  Administered 2018-07-12: 2 g via INTRAVENOUS
  Filled 2018-07-12: qty 50

## 2018-07-12 MED ORDER — SODIUM CHLORIDE 0.9 % IV SOLN
Freq: Once | INTRAVENOUS | Status: AC
  Administered 2018-07-12: 11:00:00 via INTRAVENOUS
  Filled 2018-07-12: qty 250

## 2018-07-16 ENCOUNTER — Inpatient Hospital Stay: Payer: Medicaid Other

## 2018-07-16 ENCOUNTER — Inpatient Hospital Stay: Payer: Medicaid Other | Attending: Hematology and Oncology

## 2018-07-16 ENCOUNTER — Inpatient Hospital Stay (HOSPITAL_BASED_OUTPATIENT_CLINIC_OR_DEPARTMENT_OTHER): Payer: Medicaid Other | Admitting: Hematology and Oncology

## 2018-07-16 ENCOUNTER — Encounter: Payer: Self-pay | Admitting: Hematology and Oncology

## 2018-07-16 ENCOUNTER — Other Ambulatory Visit: Payer: Self-pay

## 2018-07-16 ENCOUNTER — Telehealth: Payer: Self-pay | Admitting: *Deleted

## 2018-07-16 ENCOUNTER — Ambulatory Visit
Admission: RE | Admit: 2018-07-16 | Discharge: 2018-07-16 | Disposition: A | Discharge status: Home / Self Care | Payer: Medicaid Other | Source: Ambulatory Visit | Attending: Urgent Care | Admitting: Urgent Care

## 2018-07-16 VITALS — BP 121/79 | HR 80 | Temp 96.2°F | Wt 160.9 lb

## 2018-07-16 DIAGNOSIS — R197 Diarrhea, unspecified: Secondary | ICD-10-CM | POA: Insufficient documentation

## 2018-07-16 DIAGNOSIS — D6959 Other secondary thrombocytopenia: Secondary | ICD-10-CM | POA: Insufficient documentation

## 2018-07-16 DIAGNOSIS — C538 Malignant neoplasm of overlapping sites of cervix uteri: Secondary | ICD-10-CM | POA: Insufficient documentation

## 2018-07-16 DIAGNOSIS — Z5111 Encounter for antineoplastic chemotherapy: Secondary | ICD-10-CM | POA: Diagnosis present

## 2018-07-16 DIAGNOSIS — Z87891 Personal history of nicotine dependence: Secondary | ICD-10-CM | POA: Diagnosis not present

## 2018-07-16 DIAGNOSIS — K219 Gastro-esophageal reflux disease without esophagitis: Secondary | ICD-10-CM | POA: Insufficient documentation

## 2018-07-16 DIAGNOSIS — K838 Other specified diseases of biliary tract: Secondary | ICD-10-CM | POA: Diagnosis not present

## 2018-07-16 DIAGNOSIS — E860 Dehydration: Secondary | ICD-10-CM | POA: Diagnosis not present

## 2018-07-16 DIAGNOSIS — R112 Nausea with vomiting, unspecified: Secondary | ICD-10-CM

## 2018-07-16 DIAGNOSIS — E538 Deficiency of other specified B group vitamins: Secondary | ICD-10-CM

## 2018-07-16 DIAGNOSIS — Z96652 Presence of left artificial knee joint: Secondary | ICD-10-CM | POA: Insufficient documentation

## 2018-07-16 DIAGNOSIS — E871 Hypo-osmolality and hyponatremia: Secondary | ICD-10-CM

## 2018-07-16 DIAGNOSIS — R1011 Right upper quadrant pain: Secondary | ICD-10-CM | POA: Insufficient documentation

## 2018-07-16 DIAGNOSIS — R7989 Other specified abnormal findings of blood chemistry: Secondary | ICD-10-CM | POA: Insufficient documentation

## 2018-07-16 DIAGNOSIS — Z7901 Long term (current) use of anticoagulants: Secondary | ICD-10-CM

## 2018-07-16 DIAGNOSIS — A0472 Enterocolitis due to Clostridium difficile, not specified as recurrent: Secondary | ICD-10-CM

## 2018-07-16 DIAGNOSIS — Z79899 Other long term (current) drug therapy: Secondary | ICD-10-CM | POA: Insufficient documentation

## 2018-07-16 DIAGNOSIS — Z923 Personal history of irradiation: Secondary | ICD-10-CM | POA: Diagnosis not present

## 2018-07-16 DIAGNOSIS — T451X5A Adverse effect of antineoplastic and immunosuppressive drugs, initial encounter: Secondary | ICD-10-CM | POA: Diagnosis not present

## 2018-07-16 DIAGNOSIS — C7802 Secondary malignant neoplasm of left lung: Secondary | ICD-10-CM

## 2018-07-16 DIAGNOSIS — R945 Abnormal results of liver function studies: Secondary | ICD-10-CM

## 2018-07-16 DIAGNOSIS — C7800 Secondary malignant neoplasm of unspecified lung: Secondary | ICD-10-CM

## 2018-07-16 DIAGNOSIS — Z9049 Acquired absence of other specified parts of digestive tract: Secondary | ICD-10-CM | POA: Diagnosis not present

## 2018-07-16 DIAGNOSIS — D6851 Activated protein C resistance: Secondary | ICD-10-CM

## 2018-07-16 DIAGNOSIS — K9089 Other intestinal malabsorption: Secondary | ICD-10-CM

## 2018-07-16 DIAGNOSIS — E876 Hypokalemia: Secondary | ICD-10-CM

## 2018-07-16 LAB — COMPREHENSIVE METABOLIC PANEL
ALT: 206 U/L — ABNORMAL HIGH (ref 0–44)
AST: 154 U/L — ABNORMAL HIGH (ref 15–41)
Albumin: 4.3 g/dL (ref 3.5–5.0)
Alkaline Phosphatase: 289 U/L — ABNORMAL HIGH (ref 38–126)
Anion gap: 12 (ref 5–15)
BUN: 17 mg/dL (ref 6–20)
CO2: 23 mmol/L (ref 22–32)
Calcium: 10.1 mg/dL (ref 8.9–10.3)
Chloride: 101 mmol/L (ref 98–111)
Creatinine, Ser: 0.7 mg/dL (ref 0.44–1.00)
GFR calc Af Amer: >60 mL/min (ref >60)
GFR calc non Af Amer: >60 mL/min (ref >60)
Glucose, Bld: 106 mg/dL — ABNORMAL HIGH (ref 70–99)
Potassium: 3.7 mmol/L (ref 3.5–5.1)
Sodium: 136 mmol/L (ref 135–145)
Total Bilirubin: 0.6 mg/dL (ref 0.3–1.2)
Total Protein: 8.2 g/dL — ABNORMAL HIGH (ref 6.5–8.1)

## 2018-07-16 LAB — AMYLASE: AMYLASE: 43 U/L (ref 28–100)

## 2018-07-16 LAB — CBC WITH DIFFERENTIAL/PLATELET
Basophils Absolute: 0 10*3/uL (ref 0–0.1)
Basophils Relative: 0 %
Eosinophils Absolute: 0 10*3/uL (ref 0–0.7)
Eosinophils Relative: 0 %
HCT: 33.9 % — ABNORMAL LOW (ref 35.0–47.0)
Hemoglobin: 11.8 g/dL — ABNORMAL LOW (ref 12.0–16.0)
Lymphocytes Relative: 21 %
Lymphs Abs: 0.8 10*3/uL — ABNORMAL LOW (ref 1.0–3.6)
MCH: 34 pg (ref 26.0–34.0)
MCHC: 35 g/dL (ref 32.0–36.0)
MCV: 97.1 fL (ref 80.0–100.0)
Monocytes Absolute: 0.3 10*3/uL (ref 0.2–0.9)
Monocytes Relative: 6 %
Neutro Abs: 2.9 10*3/uL (ref 1.4–6.5)
Neutrophils Relative %: 73 %
Platelets: 144 10*3/uL — ABNORMAL LOW (ref 150–440)
RBC: 3.49 MIL/uL — ABNORMAL LOW (ref 3.80–5.20)
RDW: 15.3 % — ABNORMAL HIGH (ref 11.5–14.5)
WBC: 4 10*3/uL (ref 3.6–11.0)

## 2018-07-16 LAB — MAGNESIUM: Magnesium: 1.5 mg/dL — ABNORMAL LOW (ref 1.7–2.4)

## 2018-07-16 LAB — LIPASE, BLOOD: LIPASE: 33 U/L (ref 11–51)

## 2018-07-16 MED ORDER — SODIUM CHLORIDE 0.9 % IV SOLN: Freq: Once | INTRAVENOUS | Status: DC

## 2018-07-16 MED ORDER — HEPARIN SOD (PORK) LOCK FLUSH 100 UNIT/ML IV SOLN
500.0000 [IU] | Freq: Once | INTRAVENOUS | Status: AC
  Administered 2018-07-16: 500 [IU] via INTRAVENOUS
  Filled 2018-07-16: qty 5

## 2018-07-16 MED ORDER — SODIUM CHLORIDE 0.9 % IV SOLN
Freq: Once | INTRAVENOUS | Status: AC
  Administered 2018-07-16: 11:00:00 via INTRAVENOUS
  Filled 2018-07-16: qty 1000

## 2018-07-16 MED ORDER — SODIUM CHLORIDE 0.9 % IV SOLN
INTRAVENOUS | Status: DC
  Administered 2018-07-16: 11:00:00 via INTRAVENOUS
  Filled 2018-07-16: qty 250

## 2018-07-16 MED ORDER — SODIUM CHLORIDE 0.9 % IV SOLN
INTRAVENOUS | Status: DC
  Filled 2018-07-16: qty 1000

## 2018-07-16 MED ORDER — ONDANSETRON HCL 4 MG/2ML IJ SOLN
8.0000 mg | Freq: Once | INTRAMUSCULAR | Status: AC
  Administered 2018-07-16: 8 mg via INTRAVENOUS
  Filled 2018-07-16: qty 4

## 2018-07-16 MED ORDER — SODIUM CHLORIDE 0.9% FLUSH
10.0000 mL | Freq: Once | INTRAVENOUS | Status: AC
  Administered 2018-07-16: 10 mL via INTRAVENOUS
  Filled 2018-07-16: qty 10

## 2018-07-16 NOTE — Telephone Encounter (Signed)
Called report    IMPRESSION: Normal liver.  Prior cholecystectomy with postsurgical mild dilatation of common bile duct.   Electronically Signed   By: Abelardo Diesel M.D.   On: 07/16/2018 14:22

## 2018-07-16 NOTE — Progress Notes (Signed)
[facility] day:  07/16/2018   Chief Complaint: Joanna Hall is a 52 y.o. female with stage IV cervical cancer who is seen for assessment prior to cycle #3 carboplatin and Taxol.  HPI:  The patient was last seen in the medical oncology clinic on 07/08/2018.  At that time, she had experiencing diarrhea. She described her stools as "muddy and stinking". She remained on Questran and Lomotil. No B symptoms. Exam was grossly unremarkable.  WBC was 4300 (Joanna Hall 3200).  Platelets were 81,000.  Potassium was 4.2.  AST 26, ALT 33, ALP 132, and total bilirubin 0.4.  Magnesium was 1.3.  She received 4 gm of IV magnesium.  C. diff and GI panel by PCR were negative.  She was referred back to Dr. Marius Hall.  Magnesium was 1.5 on 07/12/2018.  She received 2 gm magnesium.  She saw Dr. Marius Hall on 07/09/2018.  Notes reviewed.  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 Pancreatic fecal elastase, fecal calprotectin, and CRP were normal. She has no evidence of microscopic colitis. She had worsening of diarrhea after chemotherapy and possibly oral magnesium supplements.  Plan was to increase cholestyramine to 4 g 3 times daily with meals, use dicyclomine for abdominal cramps.  She was started Lomotil 2 tablets every 6 hours prn.  She was educated how to titrate the dose of cholestyramine and Lomotil based on her symptoms of diarrhea.  During the interim, patient continues to have diffuse abdominal cramping. She has acute tenderness in her RIGHT upper quadrant. Patient is having chills. She has been seen by Dr. Marius Hall (GI) and restarted on Lomotil. Diarrhea has improved "some". Patient has nausea and vomiting. She is using promethazine and ondansetron. Despite eating well, patient is losing weight. Weight today is 160 lb 14.4 oz (73 kg), which compared to her last visit to the clinic, represents a 15 pound weight loss. She notes a hard time staying  hydrated. Patient states, "I am not a big drinker. I try. If I drink too much, it makes me nauseated".   Recent eye exam revealed high IOP. Patient notes that Dr. Ellin Hall mentioned "glaucoma". Records requested from reviewed. Patient indeed found to have suspected glaucoma.   Patient complains of pain rated 6/10 in the clinic today.   Past Medical History:  Diagnosis Date  . Abnormal cervical Papanicolaou smear 09/18/2017  . Anxiety   . Aortic atherosclerosis ([place])   . Arthritis    neck and knees  . 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 Blood transfusion without reported diagnosis   . Cervical cancer (HCC)    mets lung  . Chronic anal fissure   . Chronic diarrhea   . Dyspnea   . Factor V Leiden mutation ([place])   . Fecal incontinence   . Genital warts   . GERD (gastroesophageal reflux disease)   . Heart murmur   . Hemorrhoids   . 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 History of cancer chemotherapy    completed 06/2017  . History of Clostridium difficile infection    while undergoing chemo.  Negative test 10/2017  . 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 Intestinal infection due to Clostridium difficile 09/18/2017  . Macrocytic anemia with vitamin B12 deficiency   . Perianal condylomata   . Pneumonia  History of  . Pulmonary nodules   . Rectal bleeding   . Small bowel obstruction ([place]) 08/2017  . Stiff neck    limited right turn  . Vitamin D deficiency     Past Surgical History:  Procedure Laterality Date  . CHOLECYSTECTOMY    . COLON SURGERY  08/2017   resection  . COLONOSCOPY WITH PROPOFOL N/A 12/20/2017   Procedure: COLONOSCOPY WITH PROPOFOL;  Surgeon: Joanna Landsman, MD;  Location: [facility] ENDOSCOPY;  Service: Gastroenterology;  Laterality: N/A;  . DIAGNOSTIC LAPAROSCOPY    . ESOPHAGOGASTRODUODENOSCOPY (EGD) WITH PROPOFOL N/A 12/20/2017   Procedure: ESOPHAGOGASTRODUODENOSCOPY (EGD) WITH PROPOFOL;   Surgeon: Joanna Landsman, MD;  Location: [facility];  Service: Gastroenterology;  Laterality: N/A;  . LAPAROTOMY N/A 08/31/2017   Procedure: EXPLORATORY LAPAROTOMY for SBO, ileocolectomy, removal of piece of uterine wall;  Surgeon: Joanna Ree, MD;  Location: [facility] ORS;  Service: General;  Laterality: N/A;  . LASER ABLATION CONDOLAMATA N/A 02/22/2018   Procedure: LASER ABLATION/REMOVAL OF [id number] AROUND ANUS AND VAGINA;  Surgeon: Joanna Boston, MD;  Location: [facility];  Service: General;  Laterality: N/A;  . OOPHORECTOMY    . PORTA CATH INSERTION N/A 05/13/2018   Procedure: PORTA CATH INSERTION;  Surgeon: Joanna Cabal, MD;  Location: [facility];  Service: Cardiovascular;  Laterality: N/A;  . SMALL INTESTINE SURGERY    . TANDEM RING INSERTION     x3  . THORACOTOMY    . TOTAL KNEE ARTHROPLASTY Left 04/24/2018   Procedure: TOTAL KNEE ARTHROPLASTY;  Surgeon: Joanna Sheehan, MD;  Location: [facility] ORS;  Service: Orthopedics;  Laterality: Left;    Family History  Problem Relation Age of Onset  . Hypertension Father   . Diabetes Father   . Alcohol abuse Daughter   . Hypertension Maternal Grandmother   . Diabetes Maternal Grandmother   . Diabetes Paternal Grandmother   . Hypertension Paternal Grandmother     Social History:  reports that she quit smoking about 11 years ago. She has never used smokeless tobacco. She reports that she drinks alcohol. She reports that she has current or past drug history. Drug: Marijuana.  The patient's 23 year old daughter died of alcoholism.  The patient is originally from [place].  She moved from [place] to [place]. She is accompanied by her sister, Joanna Hall, today.  Allergies:  Allergies  Allergen Reactions  . Ketamine Anxiety and Other (See Comments)    Syncope episode/confusion     Current Medications: Current Outpatient Medications  Medication Sig Dispense Refill  . Calcium  Carb-Cholecalciferol (CALCIUM 500 +D) 500-400 MG-UNIT TABS Take 2 tablets by mouth daily.    . colestipol (COLESTID) 1 g tablet Take 2 tablets (2 g total) by mouth 2 (two) times daily. 60 tablet 0  . cyanocobalamin (,VITAMIN B-12,) 1000 MCG/ML injection Inject 1ML every week x 4 weeks, then 1ML every other week x 2 months, then monthly x 3 months (Patient taking differently: Inject 1,000 mcg into the muscle every 30 (thirty) days. Inject 1ML every week x 4 weeks, then 1ML every other week x 2 months, then monthly x 3 months) 10 mL 1  . dicyclomine (BENTYL) 20 MG tablet Take 1 tablet (20 mg total) by mouth every 6 (six) hours. 20 tablet 0  . diphenoxylate-atropine (LOMOTIL) 2.5-0.025 MG tablet Take 2 tablets by mouth 4 (four) times daily. 240 tablet 1  . fluticasone (FLONASE) 50 MCG/ACT nasal spray Place 2 sprays into both  nostrils daily. 16 g 0  . ibuprofen (ADVIL,MOTRIN) 600 MG tablet Take 1 tablet (600 mg total) by mouth every 8 (eight) hours as needed. 15 tablet 0  . loratadine (CLARITIN) 10 MG tablet Take 10 mg by mouth daily.    . ondansetron (ZOFRAN) 8 MG tablet Take 1 tablet (8 mg total) by mouth every 8 (eight) hours as needed for nausea or vomiting. 30 tablet 0  . promethazine (PHENERGAN) 25 MG tablet Take 1 tablet (25 mg total) by mouth every 6 (six) hours as needed for nausea or vomiting. 30 tablet 0  . rivaroxaban (XARELTO) 20 MG TABS tablet Take 1 tablet (20 mg total) by mouth daily with supper. 30 tablet 1  . sertraline (ZOLOFT) 50 MG tablet Take 1 tablet (50 mg total) by mouth daily. 30 tablet 3  . Syringe/Needle, Disp, (SYRINGE 3CC/22GX1") 22G X 1" 3 ML MISC For use with Vitamin B12 injections 50 each 0  . traMADol (ULTRAM) 50 MG tablet Take 0.5-1 tablets (25-50 mg total) by mouth every 6 (six) hours as needed. 30 tablet 0  . VENTOLIN HFA 108 (90 Base) MCG/ACT inhaler Inhale 1-2 puffs into the lungs every 4 (four) hours as needed for shortness of breath.   0   No current  facility-administered medications for this visit.    Facility-Administered Medications Ordered in Other Visits  Medication Dose Route Frequency Provider Last Rate Last Dose  . heparin lock flush 100 unit/mL  500 Units Intravenous Once Corcoran, Melissa C, MD      . heparin lock flush 100 unit/mL  500 Units Intravenous Once Corcoran, Melissa C, MD      . sodium chloride flush (NS) 0.9 % injection 10 mL  10 mL Intravenous PRN Jacquelin Hawking, NP   10 mL at 05/23/18 1225  . sodium chloride flush (NS) 0.9 % injection 10 mL  10 mL Intravenous PRN Lequita Asal, MD   10 mL at 06/10/18 0850    Review of Systems  Constitutional: Positive for chills, malaise/fatigue and weight loss (down 15 pounds). Negative for diaphoresis and fever.       "I am sick".   HENT: Negative.   Eyes: Negative.   Respiratory: Positive for shortness of breath (exertional). Negative for cough, hemoptysis and sputum production.   Cardiovascular: Negative for chest pain, palpitations, orthopnea, leg swelling and PND.  Gastrointestinal: Positive for abdominal pain (diffuse; worse in RUQ), diarrhea (recent stool studies negative for infectious etiology), nausea and vomiting. Negative for blood in stool, constipation and melena.  Genitourinary: Negative for dysuria, frequency, hematuria and urgency.  Musculoskeletal: Positive for joint pain (s/p left TKR. OA in shoulder), myalgias (generalized) and neck pain (OA in neck). Negative for back pain and falls.  Skin: Negative for itching and rash.  Neurological: Positive for dizziness and weakness (generalized). Negative for tremors and headaches.  Endo/Heme/Allergies: Does not bruise/bleed easily.  Psychiatric/Behavioral: Negative for depression, memory loss and suicidal ideas. The patient is not nervous/anxious and does not have insomnia.   All other systems reviewed and are negative.  Performance status (ECOG): 1 - 2  Vital Signs BP 121/79 (Patient Position: Sitting)    Pulse 80   Temp (!) 96.2 F (35.7 C) (Tympanic)   Wt 160 lb 14.4 oz (73 kg)   BMI 25.20 kg/m   Physical Exam  Constitutional: She is oriented to person, place, and time. She appears malnourished. She has a sickly appearance.  HENT:  Head: Normocephalic and atraumatic.  Sparse hair.  Eyes: Pupils are equal, round, and reactive to light. EOM are normal. No scleral icterus.  Blue eyes.  Neck: Normal range of motion. Neck supple. No tracheal deviation present. No thyromegaly present.  Cardiovascular: Normal rate, regular rhythm and normal heart sounds. Exam reveals no gallop and no friction rub.  No murmur heard. Pulmonary/Chest: Effort normal and breath sounds normal. No respiratory distress. She has no wheezes. She has no rales.  Abdominal: Soft. Bowel sounds are normal. She exhibits no distension. There is generalized tenderness and tenderness in the right upper quadrant.  Musculoskeletal: Normal range of motion. She exhibits no edema or tenderness.  Lymphadenopathy:    She has no cervical adenopathy.    She has no axillary adenopathy.       Right: No inguinal and no supraclavicular adenopathy present.       Left: No inguinal and no supraclavicular adenopathy present.  Neurological: She is alert and oriented to person, place, and time.  Skin: Skin is warm and dry. No rash noted. No erythema.  Psychiatric: Mood, affect and judgment normal.  Nursing note and vitals reviewed.   Imaging studies: 06/2017:  PET scan revealed enlarging pulmonary nodules but no evidence of abdominal disease per patient report. 09/13/2017:  Abdomen and pelvic CT revealed a small infarct in the inferior pole of the LEFT kidney and RIGHT uterine infarct. 11/08/2017:  [facility] revealed innumerable stable bilateral cavitary pulmonary nodules (up to 8 mm), some of which demonstrate low level metabolic activity.  There were post treatment changes in the pelvis without  evidence of suspicious hypermetabolic activity. 12/03/2017:  Abdomen and pelvic CT revealed continued multiple pulmonary nodules in the lung bases concerning for metastatic disease.  There was mild wall thickening of rectosigmoid colon is noted.  There was a stable 1.8 x 1.6 cm mixed fat and soft tissue density noted in right lower quadrant which may represent fat necrosis.  There was a larger low density is noted involving uterine fundus.  12/03/2017:  Pelvic MRI was unremarkable.   04/15/2018:  Chest, abdomen, and pelvis CT revealed innumerable (> 100) cavitary nodules scattered in the lungs, moderately enlarging compared to the 11/08/2017 PET-CT, suspicious for metastatic disease.  One index node in the RLL measures 1.0 x 1.1 cm (previously 0.6 x 0.6 cm).  There were no new nodules.  There was an ill-defined wall thickening in the rectosigmoid with surrounding stranding along fascia planes, probably sequela from prior radiation therapy.  There was multilevel lumbar impingement due to spondylosis and degenerative disc disease.  There was heterogeneous enhancement in the uterus (some possibly from prior radiation therapy). 04/23/2018:  PET scan revealed numerous scattered solid and cavitary nodules in the lungs stable increased in size compared to the prior PET-CT from 11/08/2017. Largest nodule was 1.1 cm in the LUL (SUV 1.9).  These demonstrated low-grade metabolic activity up to a maximum SUV of about 2.3, increased from 11/08/2017.    Infusion on 07/16/2018  Component Date Value Ref Range Status  . Magnesium 07/16/2018 1.5* 1.7 - 2.4 mg/dL Final   Performed at [facility], [address] [place], [place] [address]  . Sodium 07/16/2018 136  135 - 145 mmol/L Final  . Potassium 07/16/2018 3.7  3.5 - 5.1 mmol/L Final  . Chloride 07/16/2018 101  98 - 111 mmol/L Final  . CO2 07/16/2018 23  22 - 32 mmol/L Final  . Glucose, Bld 07/16/2018 106* 70 - 99 mg/dL Final  .  BUN 07/16/2018 17  6 - 20  mg/dL Final  . Creatinine, Ser 07/16/2018 0.70  0.44 - 1.00 mg/dL Final  . Calcium 07/16/2018 10.1  8.9 - 10.3 mg/dL Final  . Total Protein 07/16/2018 8.2* 6.5 - 8.1 g/dL Final  . Albumin 07/16/2018 4.3  3.5 - 5.0 g/dL Final  . AST 07/16/2018 154* 15 - 41 U/L Final  . ALT 07/16/2018 206* 0 - 44 U/L Final  . Alkaline Phosphatase 07/16/2018 289* 38 - 126 U/L Final  . Total Bilirubin 07/16/2018 0.6  0.3 - 1.2 mg/dL Final  . GFR calc non Af Amer 07/16/2018 >60  >60 mL/min Final  . GFR calc Af Amer 07/16/2018 >60  >60 mL/min Final   Comment: (NOTE) The eGFR has been calculated using the CKD EPI equation. This calculation has not been validated in all clinical situations. eGFR's persistently <60 mL/min signify possible Chronic Kidney Disease.   Georgiann Hahn gap 07/16/2018 12  5 - 15 Final   Performed at [facility], [address] [place], [place] [address]  . WBC 07/16/2018 4.0  3.6 - 11.0 K/uL Final  . RBC 07/16/2018 3.49* 3.80 - 5.20 MIL/uL Final  . Hemoglobin 07/16/2018 11.8* 12.0 - 16.0 g/dL Final  . HCT 07/16/2018 33.9* 35.0 - 47.0 % Final  . MCV 07/16/2018 97.1  80.0 - 100.0 fL Final  . MCH 07/16/2018 34.0  26.0 - 34.0 pg Final  . MCHC 07/16/2018 35.0  32.0 - 36.0 g/dL Final  . RDW 07/16/2018 15.3* 11.5 - 14.5 % Final  . Platelets 07/16/2018 144* 150 - 440 K/uL Final  . Neutrophils Relative % 07/16/2018 73  % Final  . Neutro Abs 07/16/2018 2.9  1.4 - 6.5 K/uL Final  . Lymphocytes Relative 07/16/2018 21  % Final  . Lymphs Abs 07/16/2018 0.8* 1.0 - 3.6 K/uL Final  . Monocytes Relative 07/16/2018 6  % Final  . Monocytes Absolute 07/16/2018 0.3  0.2 - 0.9 K/uL Final  . Eosinophils Relative 07/16/2018 0  % Final  . Eosinophils Absolute 07/16/2018 0.0  0 - 0.7 K/uL Final  . Basophils Relative 07/16/2018 0  % Final  . Basophils Absolute 07/16/2018 0.0  0 - 0.1 K/uL Final   Performed at [facility], [address], [place], [place] [address]    Assessment:  KENYETTE GUNDY is a 52 y.o. female with a history of stage IB1 cervical cancer s/p concurrent cisplatin and radiation (external beam and brachytherapy) from 11/2016 - 02/2017.    She was treated by Dr. Christene Slates at [facility] in [place], [place].  In 01/2017, she received cisplatin x 2 and carboplatin x 1 (01/29/2017) due to ARF and XRT. Radiation was followed by T & O on 02/01/2017 and T & N on 02/10/2017 and 02/20/2017.  Course was complicated by weight loss (80 pounds), nausea, vomiting, electrolyte wasting (potassium and magnesium), and multiple hospitalizations.  PET scan in 06/2017 revealed enlarging pulmonary nodules but no evidence of abdominal disease per patient report.  She is s/p right ileocolectomy on 08/31/2017 for a small bowel obstruction.  She presented with abdominal pain.  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 PDL-1 testing reveals a CPS score of 15.  Chest, abdomen, and pelvis CT on 04/15/2018 revealed innumerable (> 100) cavitary nodules scattered in the lungs, moderately enlarging compared  to the 11/08/2017 PET-CT, suspicious for metastatic disease. One index node in the RLL measures 1.0 x 1.1 cm.  There were no new nodules.  There was an ill-defined wall thickening in the rectosigmoid with surrounding stranding along fascia planes, probably sequela from prior radiation therapy.  There was multilevel lumbar impingement due to spondylosis and degenerative disc disease.  There was heterogeneous enhancement in the uterus (some possibly from prior radiation therapy).  PET scan on 04/23/2018 revealed numerous scattered solid and cavitary nodules in the lungs stable increased in size compared to the prior PET-CT from 11/08/2017. Largest nodule was 1.1 cm in the LUL (SUV 1.9).  These demonstrated low-grade metabolic  activity up to a maximum SUV of about 2.3, increased from 11/08/2017.   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  She s/p 2 cycles of carboplatin and Taxol (06/04/2018 - 06/25/2018) with Margarette Canada support.  Counts were adequate.  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  Testing was negative for prothrombin gene mutation, lupus anticoagulant panel, anticardiolipin antibodies, and beta-2 glycoprotein antibodies.  She was discharged on Xarelto.  She was admitted to [facility] from 12/03/2017 - 12/08/2017 with abdominal pain. She was not felt to have a uterine infarct.  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  The etiology was unclear.  She is hepatitis C positive.   Hepatitis C genotype is 2a/2c.  She has B12 deficiency.  B12 was 165 on 11/27/2017.  She began B12 injections on 11/29/2017.  B12 injections occur monthly at home.  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  She was diagnosed with colitis.  Abdomen and pelvic CT on 05/23/2018 revealed colonic wall thickening, consistent with colitis. Considerations include inflammatory or infectious causes. Ischemic causes would be less likely. She is s/p RIGHT hemicolectomy. She had a patent ileocolic anastomosis.  She received a 10 day course of ciprofloxacin and Flagyl.  She has  chronic diarrhea s/p right hemicolectomy.  She is Questran and Lomotil.  Symptomatically, patient is not feeling well today. She is has generalized weakness, continued diarrhea, chills, diffuse abdominal pain (worse RUQ), and nausea/vomiting. She denies any  fevers.  She has been seen by GI and put on Lomotil, which has slowed her diarrhea down significantly. Despite eating, she has lost 15 pounds since her last visit. Exam reveals acutely ill woman with diffuse abdominal pains.  WBC  4000 (Coffee 2900).  AST 154, ALT 206, ALP 289, and total bilirubin 0.6.  Magnesium low at 1.5.  Plan:   1. Labs today:  CBC with diff, CMP, Mg, amylase, lipase 2. Cervical cancer  Acute illness today.  Will hold cycle #3 paclitaxel + carboplatin with Udencya support. Discuss symptom management.  Patient has antiemetics and pain medications at home to use on a PRN basis. Patient  advising that the  prescribed interventions are adequate at this point. Continue all medications as previously prescribed.  3. Transaminitis  Acute exacerbation and LFTs.  AST 154, ALT 206, ALP 289, and total bilirubin 0.6.  Patient with right upper quadrant and epigastric abdominal pain.   Concern for biliary obstruction.  Patient discussed with Dr. Marius Hall.   Will check amylase and lipase levels.  STAT right upper quadrant ultrasound. 4. Intractable nausea with episodes of vomiting  Taking oral Phenergan and ondansetron at home as prescribed.  Unable to eat and drink normally due to symptoms.  Discussed the use  of TD scopolamine, however after speaking with Dr. Waynetta Sandy office, and reviewing his notes that confirmed suspected glaucoma, this is not an option.    We will give ondansetron 8 mg IV in clinic.  5. Diarrhea  Improved. She has been seen in consult by GI and prescribed Lomotil.  Recent stool studies negative for infectious etiology.  Potassium stable at 3.7.  Urged patient to continue to increase electrolyte-containing fluids to prevent dehydration.  Patient to continue to follow-up with gastroenterology as already scheduled for ongoing management. 6. Dehydration and electrolyte derangements  Discuss that dehydration likely contributory to her nausea.  BUN 17 and creatinine 0.70 (CrCl 81.8 mL/min).  Potassium stable at 3.7.  Magnesium low at 1.5.  Will give 1 L of 0.9% NS with 2 grams of magnesium IV today.  7. B12 deficiency  Continues on monthly parenteral B12 supplementation.   Patient sister administers injection at home (last injection 06/25/2018).  8. RTC on 07/22/2018 for MD assessment, labs (CBC with diff, CMP, Mg), and cycle #3 paclitaxel + carboplatin with Udencya support.   ADDENDUM: Amylase and lipase normal. Ultrasound imaging of the RUQ was normal, demonstrating only a mildly dilated CBD measuring 9.2 mm (post surgical). Discussed admission, however patient elevated to discharge home and RTC on 07/17/2018 for repeat labs and IVFs. Communicated with Dr. Verlin Grills office regarding need for follow up.    Honor Loh, NP  07/16/2018, 10:04 AM   I saw and evaluated the patient, participating in the key portions of the service and reviewing pertinent diagnostic studies and records.  I reviewed the nurse practitioner's note and agree with the findings and the plan.  The assessment and plan were discussed with the patient.  Multiple questions were asked by the patient and answered.   Nolon Stalls, MD 07/16/2018,10:04 AM

## 2018-07-16 NOTE — Telephone Encounter (Signed)
Results of RUQ ultrasound reviewed. Communicated to both Dr. Mike Gip and Dr. Marius Ditch. Study (-) for acute concerns. Hepatic blood flow normal. No bilary obstruction mentioned. CBD mildly dilated at 9.2 mm (post-cholecystectomy).   Results have also been reviewed with the patient. Discussed admission for further evaluation vs. outpatient follow up with gastroenterology. Patient elects to pursue conservative approach at this time. She wishes to be discharged home, with a scheduled follow up tomorrow for IVFs and further management as needed.  I think that her request is reasonable. She was able to eat a cup of applesauce here in clinic without vomiting. She has also been able to take in PO fluids following the IV ondansetron.    Plans: 1. Discharge home (per patient request).  2. Will place a call to GI office and have them contact patient about follow up.  3. RTC tomorrow (07/17/2018) for repeat labs (CMP, Mg), +/- additional IVFs and antiemetics. Patient aware that she can be seen by the APPs in the [facility] tomorrow if she needs to. 4. Strict return precautions reviewed with the patient. She is to call the clinic with any worsening symptoms or inability to eat or drink following antiemetics.   Honor Loh, MSN, APRN, FNP-C, CEN Oncology/Hematology Nurse Practitioner  [facility] 07/16/18, 3:35 PM

## 2018-07-17 ENCOUNTER — Inpatient Hospital Stay: Payer: Medicaid Other

## 2018-07-17 DIAGNOSIS — R945 Abnormal results of liver function studies: Secondary | ICD-10-CM

## 2018-07-17 DIAGNOSIS — R112 Nausea with vomiting, unspecified: Secondary | ICD-10-CM

## 2018-07-17 DIAGNOSIS — C538 Malignant neoplasm of overlapping sites of cervix uteri: Secondary | ICD-10-CM

## 2018-07-17 DIAGNOSIS — Z5111 Encounter for antineoplastic chemotherapy: Secondary | ICD-10-CM | POA: Diagnosis not present

## 2018-07-17 DIAGNOSIS — R1011 Right upper quadrant pain: Secondary | ICD-10-CM

## 2018-07-17 DIAGNOSIS — E876 Hypokalemia: Secondary | ICD-10-CM

## 2018-07-17 DIAGNOSIS — R7989 Other specified abnormal findings of blood chemistry: Secondary | ICD-10-CM

## 2018-07-17 DIAGNOSIS — E860 Dehydration: Secondary | ICD-10-CM

## 2018-07-17 LAB — COMPREHENSIVE METABOLIC PANEL
ALBUMIN: 4.1 g/dL (ref 3.5–5.0)
ALK PHOS: 215 U/L — AB (ref 38–126)
ALT: 117 U/L — AB (ref 0–44)
AST: 59 U/L — AB (ref 15–41)
Anion gap: 10 (ref 5–15)
BILIRUBIN TOTAL: 0.5 mg/dL (ref 0.3–1.2)
BUN: 15 mg/dL (ref 6–20)
CO2: 24 mmol/L (ref 22–32)
CREATININE: 0.75 mg/dL (ref 0.44–1.00)
Calcium: 9.5 mg/dL (ref 8.9–10.3)
Chloride: 101 mmol/L (ref 98–111)
GFR calc Af Amer: >60 mL/min (ref >60)
GFR calc non Af Amer: >60 mL/min (ref >60)
GLUCOSE: 143 mg/dL — AB (ref 70–99)
Potassium: 3.7 mmol/L (ref 3.5–5.1)
Sodium: 135 mmol/L (ref 135–145)
Total Protein: 7.8 g/dL (ref 6.5–8.1)

## 2018-07-17 LAB — CBC WITH DIFFERENTIAL/PLATELET
Basophils Absolute: 0 10*3/uL (ref 0–0.1)
Basophils Relative: 0 %
Eosinophils Absolute: 0 10*3/uL (ref 0–0.7)
Eosinophils Relative: 0 %
HCT: 32.4 % — ABNORMAL LOW (ref 35.0–47.0)
Hemoglobin: 11 g/dL — ABNORMAL LOW (ref 12.0–16.0)
Lymphocytes Relative: 22 %
Lymphs Abs: 0.9 10*3/uL — ABNORMAL LOW (ref 1.0–3.6)
MCH: 33.5 pg (ref 26.0–34.0)
MCHC: 33.9 g/dL (ref 32.0–36.0)
MCV: 98.9 fL (ref 80.0–100.0)
Monocytes Absolute: 0.3 10*3/uL (ref 0.2–0.9)
Monocytes Relative: 8 %
Neutro Abs: 2.8 10*3/uL (ref 1.4–6.5)
Neutrophils Relative %: 70 %
Platelets: 140 10*3/uL — ABNORMAL LOW (ref 150–440)
RBC: 3.28 MIL/uL — ABNORMAL LOW (ref 3.80–5.20)
RDW: 15.9 % — ABNORMAL HIGH (ref 11.5–14.5)
WBC: 4 10*3/uL (ref 3.6–11.0)

## 2018-07-17 LAB — MAGNESIUM: Magnesium: 1.5 mg/dL — ABNORMAL LOW (ref 1.7–2.4)

## 2018-07-17 MED ORDER — HEPARIN SOD (PORK) LOCK FLUSH 100 UNIT/ML IV SOLN
500.0000 [IU] | Freq: Once | INTRAVENOUS | Status: AC
  Administered 2018-07-17: 500 [IU] via INTRAVENOUS
  Filled 2018-07-17: qty 5

## 2018-07-17 MED ORDER — SODIUM CHLORIDE 0.9 % IV SOLN
Freq: Once | INTRAVENOUS | Status: AC
  Administered 2018-07-17: 15:00:00 via INTRAVENOUS
  Filled 2018-07-17: qty 1000

## 2018-07-17 MED ORDER — SODIUM CHLORIDE 0.9% FLUSH
10.0000 mL | Freq: Once | INTRAVENOUS | Status: AC
  Administered 2018-07-17: 10 mL via INTRAVENOUS
  Filled 2018-07-17: qty 10

## 2018-07-18 ENCOUNTER — Telehealth: Payer: Self-pay

## 2018-07-18 ENCOUNTER — Other Ambulatory Visit: Payer: Self-pay | Admitting: Urgent Care

## 2018-07-18 ENCOUNTER — Encounter: Payer: Self-pay | Admitting: Hematology and Oncology

## 2018-07-18 MED ORDER — TRAMADOL HCL 50 MG PO TABS: 50.0000 mg | ORAL_TABLET | Freq: Four times a day (QID) | ORAL | 0 refills | Status: DC | PRN

## 2018-07-18 NOTE — Telephone Encounter (Signed)
Patient is requesting a call back from you concerning diarrhea she states medication is not working (Lomotil) and she is constantly having diarrhea which has caused her magnesium levels to drop and pt needs advice

## 2018-07-19 ENCOUNTER — Telehealth: Payer: Self-pay | Admitting: Gastroenterology

## 2018-07-19 NOTE — Telephone Encounter (Signed)
Returned pt's call  Diarrhea resolved now on lomotil. Her LFTs are improving.  US liver showed mildly dilated CBD, post surgical Normal liver and dopplers negative No indication for ERCP at this time, T Bili is normal Monitor LFTs for now She has f/u with oncology next week  [name], MD [place]  [place], [place] [address]  Main: [phone]  Fax: [phone] Pager: [phone]

## 2018-07-22 ENCOUNTER — Other Ambulatory Visit: Payer: Self-pay | Admitting: Hematology and Oncology

## 2018-07-23 ENCOUNTER — Inpatient Hospital Stay: Payer: Medicaid Other

## 2018-07-23 ENCOUNTER — Inpatient Hospital Stay (HOSPITAL_BASED_OUTPATIENT_CLINIC_OR_DEPARTMENT_OTHER): Payer: Medicaid Other | Admitting: Hematology and Oncology

## 2018-07-23 ENCOUNTER — Encounter: Payer: Self-pay | Admitting: Hematology and Oncology

## 2018-07-23 VITALS — BP 114/72 | Temp 97.7°F | Resp 18 | Ht 67.0 in | Wt 165.9 lb

## 2018-07-23 DIAGNOSIS — E860 Dehydration: Secondary | ICD-10-CM

## 2018-07-23 DIAGNOSIS — Z79899 Other long term (current) drug therapy: Secondary | ICD-10-CM

## 2018-07-23 DIAGNOSIS — D6851 Activated protein C resistance: Secondary | ICD-10-CM

## 2018-07-23 DIAGNOSIS — E871 Hypo-osmolality and hyponatremia: Secondary | ICD-10-CM | POA: Diagnosis not present

## 2018-07-23 DIAGNOSIS — R197 Diarrhea, unspecified: Secondary | ICD-10-CM

## 2018-07-23 DIAGNOSIS — B182 Chronic viral hepatitis C: Secondary | ICD-10-CM

## 2018-07-23 DIAGNOSIS — K529 Noninfective gastroenteritis and colitis, unspecified: Secondary | ICD-10-CM

## 2018-07-23 DIAGNOSIS — Z7901 Long term (current) use of anticoagulants: Secondary | ICD-10-CM

## 2018-07-23 DIAGNOSIS — Z923 Personal history of irradiation: Secondary | ICD-10-CM

## 2018-07-23 DIAGNOSIS — Z5111 Encounter for antineoplastic chemotherapy: Secondary | ICD-10-CM | POA: Diagnosis not present

## 2018-07-23 DIAGNOSIS — C7802 Secondary malignant neoplasm of left lung: Secondary | ICD-10-CM | POA: Diagnosis not present

## 2018-07-23 DIAGNOSIS — C538 Malignant neoplasm of overlapping sites of cervix uteri: Secondary | ICD-10-CM

## 2018-07-23 DIAGNOSIS — E538 Deficiency of other specified B group vitamins: Secondary | ICD-10-CM

## 2018-07-23 DIAGNOSIS — R7989 Other specified abnormal findings of blood chemistry: Secondary | ICD-10-CM

## 2018-07-23 DIAGNOSIS — C7800 Secondary malignant neoplasm of unspecified lung: Secondary | ICD-10-CM

## 2018-07-23 LAB — CBC WITH DIFFERENTIAL/PLATELET
Basophils Absolute: 0 10*3/uL (ref 0–0.1)
Basophils Relative: 0 %
Eosinophils Absolute: 0 10*3/uL (ref 0–0.7)
Eosinophils Relative: 1 %
HCT: 29.6 % — ABNORMAL LOW (ref 35.0–47.0)
HEMOGLOBIN: 10.3 g/dL — AB (ref 12.0–16.0)
LYMPHS ABS: 0.5 10*3/uL — AB (ref 1.0–3.6)
LYMPHS PCT: 21 %
MCH: 34.6 pg — AB (ref 26.0–34.0)
MCHC: 34.8 g/dL (ref 32.0–36.0)
MCV: 99.6 fL (ref 80.0–100.0)
MONOS PCT: 8 %
Monocytes Absolute: 0.2 10*3/uL (ref 0.2–0.9)
NEUTROS PCT: 70 %
Neutro Abs: 1.8 10*3/uL (ref 1.4–6.5)
PLATELETS: 140 10*3/uL — AB (ref 150–440)
RBC: 2.97 MIL/uL — ABNORMAL LOW (ref 3.80–5.20)
RDW: 17.3 % — AB (ref 11.5–14.5)
WBC: 2.6 10*3/uL — ABNORMAL LOW (ref 3.6–11.0)

## 2018-07-23 LAB — COMPREHENSIVE METABOLIC PANEL
ALT: 37 U/L (ref 0–44)
AST: 31 U/L (ref 15–41)
Albumin: 3.8 g/dL (ref 3.5–5.0)
Alkaline Phosphatase: 144 U/L — ABNORMAL HIGH (ref 38–126)
Anion gap: 7 (ref 5–15)
BUN: 14 mg/dL (ref 6–20)
CHLORIDE: 104 mmol/L (ref 98–111)
CO2: 27 mmol/L (ref 22–32)
CREATININE: 0.74 mg/dL (ref 0.44–1.00)
Calcium: 9.1 mg/dL (ref 8.9–10.3)
GFR calc non Af Amer: >60 mL/min (ref >60)
Glucose, Bld: 110 mg/dL — ABNORMAL HIGH (ref 70–99)
Potassium: 3.5 mmol/L (ref 3.5–5.1)
SODIUM: 138 mmol/L (ref 135–145)
Total Bilirubin: 0.5 mg/dL (ref 0.3–1.2)
Total Protein: 7.3 g/dL (ref 6.5–8.1)

## 2018-07-23 LAB — MAGNESIUM: Magnesium: 1.5 mg/dL — ABNORMAL LOW (ref 1.7–2.4)

## 2018-07-23 MED ORDER — SODIUM CHLORIDE 0.9 % IV SOLN
2.0000 g | Freq: Once | INTRAVENOUS | Status: AC
  Administered 2018-07-23: 2 g via INTRAVENOUS
  Filled 2018-07-23: qty 4

## 2018-07-23 MED ORDER — ONDANSETRON HCL 8 MG PO TABS: 8.0000 mg | ORAL_TABLET | Freq: Three times a day (TID) | ORAL | 0 refills | Status: DC | PRN

## 2018-07-23 MED ORDER — FAMOTIDINE IN NACL 20-0.9 MG/50ML-% IV SOLN
20.0000 mg | Freq: Once | INTRAVENOUS | Status: AC
  Administered 2018-07-23: 20 mg via INTRAVENOUS
  Filled 2018-07-23: qty 50

## 2018-07-23 MED ORDER — SODIUM CHLORIDE 0.9% FLUSH
10.0000 mL | INTRAVENOUS | Status: DC | PRN
  Administered 2018-07-23: 10 mL via INTRAVENOUS
  Filled 2018-07-23: qty 10

## 2018-07-23 MED ORDER — PALONOSETRON HCL INJECTION 0.25 MG/5ML
0.2500 mg | Freq: Once | INTRAVENOUS | Status: AC
  Administered 2018-07-23: 0.25 mg via INTRAVENOUS
  Filled 2018-07-23: qty 5

## 2018-07-23 MED ORDER — SODIUM CHLORIDE 0.9 % IV SOLN
497.2000 mg | Freq: Once | INTRAVENOUS | Status: AC
  Administered 2018-07-23: 500 mg via INTRAVENOUS
  Filled 2018-07-23: qty 50

## 2018-07-23 MED ORDER — DIPHENHYDRAMINE HCL 50 MG/ML IJ SOLN
50.0000 mg | Freq: Once | INTRAMUSCULAR | Status: AC
  Administered 2018-07-23: 50 mg via INTRAVENOUS
  Filled 2018-07-23: qty 1

## 2018-07-23 MED ORDER — SODIUM CHLORIDE 0.9 % IV SOLN
Freq: Once | INTRAVENOUS | Status: AC
  Administered 2018-07-23: 11:00:00 via INTRAVENOUS
  Filled 2018-07-23: qty 250

## 2018-07-23 MED ORDER — HEPARIN SOD (PORK) LOCK FLUSH 100 UNIT/ML IV SOLN
500.0000 [IU] | Freq: Once | INTRAVENOUS | Status: AC
  Administered 2018-07-23: 500 [IU] via INTRAVENOUS
  Filled 2018-07-23: qty 5

## 2018-07-23 MED ORDER — SODIUM CHLORIDE 0.9 % IV SOLN
20.0000 mg | Freq: Once | INTRAVENOUS | Status: AC
  Administered 2018-07-23: 20 mg via INTRAVENOUS
  Filled 2018-07-23: qty 2

## 2018-07-23 MED ORDER — SODIUM CHLORIDE 0.9 % IV SOLN
155.0000 mg/m2 | Freq: Once | INTRAVENOUS | Status: AC
  Administered 2018-07-23: 294 mg via INTRAVENOUS
  Filled 2018-07-23: qty 49

## 2018-07-23 NOTE — Progress Notes (Signed)
[facility] day:  07/23/2018   Chief Complaint: Joanna Hall is a 52 y.o. female with stage IV cervical cancer who is seen for assessment prior to cycle #3 carboplatin and Taxol.  HPI:  The patient was last seen in the medical oncology clinic on 07/16/2018.  At that time, she was not feeling well. She felt weak, had continued diarrhea, chills, diffuse abdominal pain (worse RUQ), and nausea/vomiting. She denied any  fevers.  She had been seen by GI and put on Lomotil, which has slowed her diarrhea down significantly. Despite eating, she had lost 15 pounds since her last visit.  Exam revealedacutely ill woman with diffuse abdominal pains.  WBC was 4000 ([place] 2900).  AST 154, ALT 206, ALP 289, and total bilirubin 0.6.  Magnesium was 1.5.  Chemotherapy was held.  Amylase and lipase were normal.  RUQ ultrasound revealed a normal liver with prior cholecystectomy with postsurgical mild dilatation of the common bile duct.  Follow-up labs on 07/17/2018 showed improvement:  AST 59, ALT 117, bilirubin 0.5, and alkaline phosphatase 215.    She spoke with Dr. Marius Ditch on 07/19/2018.  Diarrhea had resolved on Lomotil.  There were no indications for ERCP.  Plan was to monitor LFTs.  During the interim, patient notes that she is doing "a lot better". She has been feeling "pretty good" over the course of the last few days. She continues to have loose diarrhea, but notes some overall improvement. Patient has spoken with GI on the phone and advised that labs were improving and she did not need to be seen at this time. 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 She states, "I have the treatment at home but I can't miss a day. We had planned on doing it in December when I have a chemotherapy break".   Patient denies that she has experienced any B symptoms. She denies any interval infections.  Patient's appetite is "so so". Weight today is 165 lb 14.4 oz (75.3 kg), which  compared to her last visit to the clinic, represents a 5 pound increase. She continues on monthly parenteral B12 supplementation. Her injection will be given later today by her sister at home   Patient complains of pain rated 4/10 in the clinic today.   Past Medical History:  Diagnosis Date  . Abnormal cervical Papanicolaou smear 09/18/2017  . Anxiety   . Aortic atherosclerosis ([place])   . Arthritis    neck and knees  . 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 Blood transfusion without reported diagnosis   . Cervical cancer (HCC)    mets lung  . Chronic anal fissure   . Chronic diarrhea   . Dyspnea   . Factor V Leiden mutation ([place])   . Fecal incontinence   . Genital warts   . GERD (gastroesophageal reflux disease)   . Heart murmur   . Hemorrhoids   . 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 History of cancer chemotherapy    completed 06/2017  . History of Clostridium difficile infection    while undergoing chemo.  Negative test 10/2017  . 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 Intestinal infection due to Clostridium difficile 09/18/2017  . Macrocytic anemia with vitamin B12 deficiency   . Perianal condylomata   . Pneumonia    History of  . Pulmonary nodules   .  Rectal bleeding   . Small bowel obstruction ([place]) 08/2017  . Stiff neck    limited right turn  . Vitamin D deficiency     Past Surgical History:  Procedure Laterality Date  . CHOLECYSTECTOMY    . COLON SURGERY  08/2017   resection  . COLONOSCOPY WITH PROPOFOL N/A 12/20/2017   Procedure: COLONOSCOPY WITH PROPOFOL;  Surgeon: [name], MD;  Location: [facility] ENDOSCOPY;  Service: Gastroenterology;  Laterality: N/A;  . DIAGNOSTIC LAPAROSCOPY    . ESOPHAGOGASTRODUODENOSCOPY (EGD) WITH PROPOFOL N/A 12/20/2017   Procedure: ESOPHAGOGASTRODUODENOSCOPY (EGD) WITH PROPOFOL;  Surgeon: [name], MD;  Location: [facility];  Service: Gastroenterology;   Laterality: N/A;  . LAPAROTOMY N/A 08/31/2017   Procedure: EXPLORATORY LAPAROTOMY for SBO, ileocolectomy, removal of piece of uterine wall;  Surgeon: [name], MD;  Location: [facility] ORS;  Service: General;  Laterality: N/A;  . LASER ABLATION CONDOLAMATA N/A 02/22/2018   Procedure: LASER ABLATION/REMOVAL OF [id number] AROUND ANUS AND VAGINA;  Surgeon: Michael Boston, MD;  Location: [place];  Service: General;  Laterality: N/A;  . OOPHORECTOMY    . PORTA CATH INSERTION N/A 05/13/2018   Procedure: PORTA CATH INSERTION;  Surgeon: Katha Cabal, MD;  Location: [facility];  Service: Cardiovascular;  Laterality: N/A;  . SMALL INTESTINE SURGERY    . TANDEM RING INSERTION     x3  . THORACOTOMY    . TOTAL KNEE ARTHROPLASTY Left 04/24/2018   Procedure: TOTAL KNEE ARTHROPLASTY;  Surgeon: Lovell Sheehan, MD;  Location: [facility] ORS;  Service: Orthopedics;  Laterality: Left;    Family History  Problem Relation Age of Onset  . Hypertension Father   . Diabetes Father   . Alcohol abuse Daughter   . Hypertension Maternal Grandmother   . Diabetes Maternal Grandmother   . Diabetes Paternal Grandmother   . Hypertension Paternal Grandmother     Social History:  reports that she quit smoking about 11 years ago. She has never used smokeless tobacco. She reports that she drinks alcohol. She reports that she has current or past drug history. Drug: Marijuana.  The patient's 64 year old daughter died of alcoholism.  The patient is originally from [place].  She moved from [place] to [place]. She is alone today.   Allergies:  Allergies  Allergen Reactions  . Ketamine Anxiety and Other (See Comments)    Syncope episode/confusion     Current Medications: Current Outpatient Medications  Medication Sig Dispense Refill  . Calcium Carb-Cholecalciferol (CALCIUM 500 +D) 500-400 MG-UNIT TABS Take 2 tablets by mouth daily.    . colestipol (COLESTID) 1 g tablet  Take 2 tablets (2 g total) by mouth 2 (two) times daily. 60 tablet 0  . cyanocobalamin (,VITAMIN B-12,) 1000 MCG/ML injection Inject 1ML every week x 4 weeks, then 1ML every other week x 2 months, then monthly x 3 months (Patient taking differently: Inject 1,000 mcg into the muscle every 30 (thirty) days. Inject 1ML every week x 4 weeks, then 1ML every other week x 2 months, then monthly x 3 months) 10 mL 1  . dicyclomine (BENTYL) 20 MG tablet Take 1 tablet (20 mg total) by mouth every 6 (six) hours. 20 tablet 0  . diphenoxylate-atropine (LOMOTIL) 2.5-0.025 MG tablet Take 2 tablets by mouth 4 (four) times daily. 240 tablet 1  . loratadine (CLARITIN) 10 MG tablet Take 10 mg by mouth daily.    . promethazine (PHENERGAN) 25 MG tablet Take 1 tablet (  25 mg total) by mouth every 6 (six) hours as needed for nausea or vomiting. 30 tablet 0  . rivaroxaban (XARELTO) 20 MG TABS tablet Take 1 tablet (20 mg total) by mouth daily with supper. 30 tablet 1  . sertraline (ZOLOFT) 50 MG tablet Take 1 tablet (50 mg total) by mouth daily. 30 tablet 3  . Syringe/Needle, Disp, (SYRINGE 3CC/22GX1") 22G X 1" 3 ML MISC For use with Vitamin B12 injections 50 each 0  . traMADol (ULTRAM) 50 MG tablet Take 1 tablet (50 mg total) by mouth every 6 (six) hours as needed. 30 tablet 0  . fluticasone (FLONASE) 50 MCG/ACT nasal spray Place 2 sprays into both nostrils daily. (Patient not taking: Reported on 07/23/2018) 16 g 0  . ibuprofen (ADVIL,MOTRIN) 600 MG tablet Take 1 tablet (600 mg total) by mouth every 8 (eight) hours as needed. (Patient not taking: Reported on 07/23/2018) 15 tablet 0  . ondansetron (ZOFRAN) 8 MG tablet Take 1 tablet (8 mg total) by mouth every 8 (eight) hours as needed for nausea or vomiting. (Patient not taking: Reported on 07/23/2018) 30 tablet 0  . VENTOLIN HFA 108 (90 Base) MCG/ACT inhaler Inhale 1-2 puffs into the lungs every 4 (four) hours as needed for shortness of breath.   0   No current  facility-administered medications for this visit.    Facility-Administered Medications Ordered in Other Visits  Medication Dose Route Frequency Provider Last Rate Last Dose  . heparin lock flush 100 unit/mL  500 Units Intravenous Once Corcoran, Melissa C, MD      . heparin lock flush 100 unit/mL  500 Units Intravenous Once Corcoran, Melissa C, MD      . sodium chloride flush (NS) 0.9 % injection 10 mL  10 mL Intravenous PRN Jacquelin Hawking, NP   10 mL at 05/23/18 1225  . sodium chloride flush (NS) 0.9 % injection 10 mL  10 mL Intravenous PRN Lequita Asal, MD   10 mL at 06/10/18 0850  . sodium chloride flush (NS) 0.9 % injection 10 mL  10 mL Intravenous PRN Lequita Asal, MD   10 mL at 07/23/18 0913    Review of Systems  Constitutional: Negative for diaphoresis, fever, malaise/fatigue and weight loss (up 5 pounds; appetite "so so").       "I am feeling better".  HENT: Negative.   Eyes: Negative.   Respiratory: Positive for shortness of breath (exertional). Negative for cough, hemoptysis and sputum production.   Cardiovascular: Negative for chest pain, palpitations, orthopnea, leg swelling and PND.  Gastrointestinal: Positive for diarrhea and nausea (improved). Negative for abdominal pain, blood in stool, constipation, melena and vomiting.  Genitourinary: Negative for dysuria, frequency, hematuria and urgency.  Musculoskeletal: Positive for joint pain (s/p left TKR. OA in shoulder) and neck pain (OA in neck). Negative for back pain, falls and myalgias.  Skin: Negative for itching and rash.  Neurological: Negative for dizziness, tremors, weakness and headaches.  Endo/Heme/Allergies: Does not bruise/bleed easily.  Psychiatric/Behavioral: Negative for depression, memory loss and suicidal ideas. The patient is not nervous/anxious and does not have insomnia.   All other systems reviewed and are negative.  Performance status (ECOG): 1 - 2  Vital Signs BP 114/72 (BP Location:  Right Arm, Patient Position: Sitting)   Temp 97.7 F (36.5 C) (Tympanic)   Resp 18   Ht 5' 7"  (1.702 m)   Wt 165 lb 14.4 oz (75.3 kg)   BMI 25.98 kg/m   Physical Exam  Constitutional: She is oriented to person, place, and time and well-developed, well-nourished, and in no distress.  HENT:  Head: Normocephalic and atraumatic. Hair is abnormal (sparse; chemotherapy induced alopecia).  Eyes: Pupils are equal, round, and reactive to light. EOM are normal. No scleral icterus.  Blue eyes  Neck: Normal range of motion. Neck supple. No tracheal deviation present. No thyromegaly present.  Cardiovascular: Normal rate, regular rhythm and normal heart sounds. Exam reveals no gallop and no friction rub.  No murmur heard. Pulmonary/Chest: Effort normal and breath sounds normal. No respiratory distress. She has no wheezes. She has no rales.  Abdominal: Soft. Bowel sounds are normal. She exhibits no distension. There is no tenderness.  Musculoskeletal: Normal range of motion. She exhibits no edema or tenderness.  Lymphadenopathy:    She has no cervical adenopathy.    She has no axillary adenopathy.       Right: No inguinal and no supraclavicular adenopathy present.       Left: No inguinal and no supraclavicular adenopathy present.  Neurological: She is alert and oriented to person, place, and time.  Skin: Skin is warm and dry. No rash noted. No erythema.  Psychiatric: Mood, affect and judgment normal.  Nursing note and vitals reviewed.   Imaging studies: 06/2017:  PET scan revealed enlarging pulmonary nodules but no evidence of abdominal disease per patient report. 09/13/2017:  Abdomen and pelvic CT revealed a small infarct in the inferior pole of the LEFT kidney and RIGHT uterine infarct. 11/08/2017:  PET scan at the [facility] revealed innumerable stable bilateral cavitary pulmonary nodules (up to 8 mm), some of which demonstrate low level metabolic activity.  There  were post treatment changes in the pelvis without evidence of suspicious hypermetabolic activity. 12/03/2017:  Abdomen and pelvic CT revealed continued multiple pulmonary nodules in the lung bases concerning for metastatic disease.  There was mild wall thickening of rectosigmoid colon is noted.  There was a stable 1.8 x 1.6 cm mixed fat and soft tissue density noted in right lower quadrant which may represent fat necrosis.  There was a larger low density is noted involving uterine fundus.  12/03/2017:  Pelvic MRI was unremarkable.   04/15/2018:  Chest, abdomen, and pelvis CT revealed innumerable (> 100) cavitary nodules scattered in the lungs, moderately enlarging compared to the 11/08/2017 PET-CT, suspicious for metastatic disease.  One index node in the RLL measures 1.0 x 1.1 cm (previously 0.6 x 0.6 cm).  There were no new nodules.  There was an ill-defined wall thickening in the rectosigmoid with surrounding stranding along fascia planes, probably sequela from prior radiation therapy.  There was multilevel lumbar impingement due to spondylosis and degenerative disc disease.  There was heterogeneous enhancement in the uterus (some possibly from prior radiation therapy). 04/23/2018:  PET scan revealed numerous scattered solid and cavitary nodules in the lungs stable increased in size compared to the prior PET-CT from 11/08/2017. Largest nodule was 1.1 cm in the LUL (SUV 1.9).  These demonstrated low-grade metabolic activity up to a maximum SUV of about 2.3, increased from 11/08/2017.    Infusion on 07/23/2018  Component Date Value Ref Range Status  . Magnesium 07/23/2018 1.5* 1.7 - 2.4 mg/dL Final   Performed at [facility], [address], [place], [place] [address]  . Sodium 07/23/2018 138  135 - 145 mmol/L Final  . Potassium 07/23/2018 3.5  3.5 - 5.1 mmol/L Final  . Chloride 07/23/2018 104  98 - 111 mmol/L  Final  . CO2 07/23/2018 27  22 - 32 mmol/L Final  . Glucose, Bld 07/23/2018 110* 70  - 99 mg/dL Final  . BUN 07/23/2018 14  6 - 20 mg/dL Final  . Creatinine, Ser 07/23/2018 0.74  0.44 - 1.00 mg/dL Final  . Calcium 07/23/2018 9.1  8.9 - 10.3 mg/dL Final  . Total Protein 07/23/2018 7.3  6.5 - 8.1 g/dL Final  . Albumin 07/23/2018 3.8  3.5 - 5.0 g/dL Final  . AST 07/23/2018 31  15 - 41 U/L Final  . ALT 07/23/2018 37  0 - 44 U/L Final  . Alkaline Phosphatase 07/23/2018 144* 38 - 126 U/L Final  . Total Bilirubin 07/23/2018 0.5  0.3 - 1.2 mg/dL Final  . GFR calc non Af Amer 07/23/2018 >60  >60 mL/min Final  . GFR calc Af Amer 07/23/2018 >60  >60 mL/min Final   Comment: (NOTE) The eGFR has been calculated using the CKD EPI equation. This calculation has not been validated in all clinical situations. eGFR's persistently <60 mL/min signify possible Chronic Kidney Disease.   Georgiann Hahn gap 07/23/2018 7  5 - 15 Final   Performed at [facility], [address] [place], [place] [address]  . WBC 07/23/2018 2.6* 3.6 - 11.0 K/uL Final  . RBC 07/23/2018 2.97* 3.80 - 5.20 MIL/uL Final  . Hemoglobin 07/23/2018 10.3* 12.0 - 16.0 g/dL Final  . HCT 07/23/2018 29.6* 35.0 - 47.0 % Final  . MCV 07/23/2018 99.6  80.0 - 100.0 fL Final  . MCH 07/23/2018 34.6* 26.0 - 34.0 pg Final  . MCHC 07/23/2018 34.8  32.0 - 36.0 g/dL Final  . RDW 07/23/2018 17.3* 11.5 - 14.5 % Final  . Platelets 07/23/2018 140* 150 - 440 K/uL Final  . Neutrophils Relative % 07/23/2018 70  % Final  . Neutro Abs 07/23/2018 1.8  1.4 - 6.5 K/uL Final  . Lymphocytes Relative 07/23/2018 21  % Final  . Lymphs Abs 07/23/2018 0.5* 1.0 - 3.6 K/uL Final  . Monocytes Relative 07/23/2018 8  % Final  . Monocytes Absolute 07/23/2018 0.2  0.2 - 0.9 K/uL Final  . Eosinophils Relative 07/23/2018 1  % Final  . Eosinophils Absolute 07/23/2018 0.0  0 - 0.7 K/uL Final  . Basophils Relative 07/23/2018 0  % Final  . Basophils Absolute 07/23/2018 0.0  0 - 0.1 K/uL Final   Performed at [facility], [address],  [place], [place] [address]    Assessment:  Joanna Hall is a 52 y.o. female with a history of stage IB1 cervical cancer s/p concurrent cisplatin and radiation (external beam and brachytherapy) from 11/2016 - 02/2017.    She was treated by Dr. Christene Slates at [facility] in [place], [place].  In 01/2017, she received cisplatin x 2 and carboplatin x 1 (01/29/2017) due to ARF and XRT. Radiation was followed by T & O on 02/01/2017 and T & N on 02/10/2017 and 02/20/2017.  Course was complicated by weight loss (80 pounds), nausea, vomiting, electrolyte wasting (potassium and magnesium), and multiple hospitalizations.  PET scan in 06/2017 revealed enlarging pulmonary nodules but no evidence of abdominal disease per patient report.  She is s/p right ileocolectomy on 08/31/2017 for a small bowel obstruction.  She presented with abdominal pain.  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 PDL-1 testing reveals  a CPS score of 15.  Chest, abdomen, and pelvis CT on 04/15/2018 revealed innumerable (> 100) cavitary nodules scattered in the lungs, moderately enlarging compared to the 11/08/2017 PET-CT, suspicious for metastatic disease. One index node in the RLL measures 1.0 x 1.1 cm.  There were no new nodules.  There was an ill-defined wall thickening in the rectosigmoid with surrounding stranding along fascia planes, probably sequela from prior radiation therapy.  There was multilevel lumbar impingement due to spondylosis and degenerative disc disease.  There was heterogeneous enhancement in the uterus (some possibly from prior radiation therapy).  PET scan on 04/23/2018 revealed numerous scattered solid and cavitary nodules in the lungs stable increased in size compared to the prior PET-CT from 11/08/2017. Largest nodule was 1.1 cm in the LUL (SUV  1.9).  These demonstrated low-grade metabolic activity up to a maximum SUV of about 2.3, increased from 11/08/2017.   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  She s/p 2 cycles of carboplatin and Taxol (06/04/2018 - 06/25/2018) with Margarette Canada support.  Counts were adequate.  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  Testing was negative for prothrombin gene mutation, lupus anticoagulant panel, anticardiolipin antibodies, and beta-2 glycoprotein antibodies.  She was discharged on Xarelto.  She was admitted to [facility] from 12/03/2017 - 12/08/2017 with abdominal pain. She was not felt to have a uterine infarct.  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  The etiology was unclear.  She is hepatitis C positive.   Hepatitis C genotype is 2a/2c.  She has B12 deficiency.  B12 was 165 on 11/27/2017.  She began B12 injections on 11/29/2017.  B12 injections occur monthly at home.  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  She was diagnosed with colitis.  Abdomen and pelvic CT on 05/23/2018 revealed colonic wall thickening, consistent with colitis. Considerations include inflammatory or infectious causes. Ischemic causes would be less likely. She is s/p RIGHT hemicolectomy. She had a patent ileocolic anastomosis.  She received a 10 day course of ciprofloxacin and Flagyl.  She has  chronic diarrhea s/p right hemicolectomy.  She is Questran and Lomotil.  Symptomatically, patient is feeling better. She continues to have diarrhea, but notes improvement. Abdominal pain has improved. No further vomiting. No fevers. Appetite is "so so"; weight up 5 pounds. Exam stable. WBC 2600 (ANC 1800). AST 31, ALT 37, ALP 144, and total bilirubin 0.5.  Plan:   1. Labs today:  CBC with diff, CMP, Mg. 2. Cervical cancer  Doing  well overall. Tolerating treatments with minimal side effects.  Labs reviewed. Blood counts stable and adequate enough for treatment. Discuss concern of declining WBC count with dose changes made with last cycle.   Paclitaxel dose to remain at 155 mg/m  Reduce carboplatin to original 500 mg dose (AUC of 4).  Will proceed with cycle #3 paclitaxel + carboplatin with Neulasta support.  Discuss symptom management.  Patient has antiemetics and pain medications at home to use on a PRN basis. Patient  advising that the  prescribed interventions are adequate at this point. Continue all medications as previously prescribed.  3. Transaminitis  LFTs improved. AST 31, ALT 37, ALP 144, and total bilirubin 0.5.  Abdominal pain has resolved.   Patient has spoken with GI via phone. With LFTs improving, no need for immediate follow up. Follow up as scheduled.  4. Nausea, vomiting, and diarrhea  Nausea well controlled with prescribed interventions. No vomiting.   Diarrhea persists, but has improved. Continues on Questran and Lomotil.  All electrolytes stable. Encouraged to continues to increase electrolyte containing fluids to prevent dehydration.  5. Dehydration and electrolyte derangements  BUN 14 and creatinine 0.74 (CrCl 87.1 mL/min)  Potassium stable at 3.5.   Magnesium low at 1.5. Replace with 2 gm IV Mg today.  6. B12 deficiency  Continues on monthly parenteral B12 supplementation.  Patient's sister administers injections at home. Will receive injection today.  7. RTC on 08/02/2018 for labs (CBC with diff, CMP, Mg) and +/- IVFs with Mg and K+ 8. RTC on 08/13/2018 for MD assessment, labs (CBC with diff, CMP, Mg), and cycle #4 paclitaxel + carboplatin with Neulasta support   Honor Loh, NP  07/23/2018, 10:08 AM   I saw and evaluated the patient, participating in the key portions of the service and reviewing pertinent diagnostic studies and records.  I reviewed the nurse practitioner's  note and agree with the findings and the plan.  The assessment and plan were discussed with the patient.  Multiple questions were asked by the patient and answered.   Nolon Stalls, MD 07/23/2018,10:08 AM

## 2018-07-24 ENCOUNTER — Inpatient Hospital Stay: Payer: Medicaid Other

## 2018-07-24 DIAGNOSIS — C538 Malignant neoplasm of overlapping sites of cervix uteri: Secondary | ICD-10-CM

## 2018-07-24 DIAGNOSIS — Z5111 Encounter for antineoplastic chemotherapy: Secondary | ICD-10-CM | POA: Diagnosis not present

## 2018-07-24 MED ORDER — PEGFILGRASTIM INJECTION 6 MG/0.6ML ~~LOC~~
6.0000 mg | PREFILLED_SYRINGE | Freq: Once | SUBCUTANEOUS | Status: AC
  Administered 2018-07-24: 6 mg via SUBCUTANEOUS

## 2018-07-25 ENCOUNTER — Encounter: Payer: Self-pay | Admitting: Hematology and Oncology

## 2018-07-25 ENCOUNTER — Other Ambulatory Visit: Payer: Self-pay | Admitting: Urgent Care

## 2018-07-25 MED ORDER — PROMETHAZINE HCL 25 MG PO TABS: 25.0000 mg | ORAL_TABLET | Freq: Four times a day (QID) | ORAL | 0 refills | Status: DC | PRN

## 2018-07-25 MED ORDER — TRAZODONE HCL 50 MG PO TABS: 25.0000 mg | ORAL_TABLET | Freq: Every evening | ORAL | 0 refills | Status: DC | PRN

## 2018-07-25 NOTE — Progress Notes (Signed)
Re: Refill requests  Patient sent in a MyChart message requesting several medications.   Additionally, she is requesting something for sleep citing that she is anxious and unable to sleep. She has tried OTC sleep aids, including melatonin. I would like to try to avoid potent hypnotics and BZO therapy at this time. Will try low dose sedative.  Rx sent in for: 1. Ondansetron 8 mg q8 PRN (Disp #30) - refill 2. Phenergan  25 mg q6h PRN (Disp #30) - refill  3. Desyrel 25-50 mg qhs PRN (Disp #30) - new  Honor Loh, MSN, APRN, FNP-C, CEN Oncology/Hematology Nurse Practitioner  [facility] [place] 07/25/18, 12:19 PM

## 2018-07-27 ENCOUNTER — Other Ambulatory Visit: Payer: Self-pay

## 2018-07-27 ENCOUNTER — Emergency Department: Payer: Medicaid Other

## 2018-07-27 ENCOUNTER — Inpatient Hospital Stay
Admission: EM | Admit: 2018-07-27 | Discharge: 2018-08-01 | DRG: 394 | Disposition: A | Discharge status: Home / Self Care | Payer: Medicaid Other | Attending: Internal Medicine | Admitting: Internal Medicine

## 2018-07-27 ENCOUNTER — Encounter: Payer: Self-pay | Admitting: Emergency Medicine

## 2018-07-27 DIAGNOSIS — R112 Nausea with vomiting, unspecified: Secondary | ICD-10-CM

## 2018-07-27 DIAGNOSIS — D6851 Activated protein C resistance: Secondary | ICD-10-CM | POA: Diagnosis present

## 2018-07-27 DIAGNOSIS — Z980 Intestinal bypass and anastomosis status: Secondary | ICD-10-CM | POA: Diagnosis not present

## 2018-07-27 DIAGNOSIS — C7800 Secondary malignant neoplasm of unspecified lung: Secondary | ICD-10-CM | POA: Diagnosis present

## 2018-07-27 DIAGNOSIS — K521 Toxic gastroenteritis and colitis: Secondary | ICD-10-CM | POA: Diagnosis present

## 2018-07-27 DIAGNOSIS — Z7901 Long term (current) use of anticoagulants: Secondary | ICD-10-CM

## 2018-07-27 DIAGNOSIS — K529 Noninfective gastroenteritis and colitis, unspecified: Secondary | ICD-10-CM | POA: Diagnosis not present

## 2018-07-27 DIAGNOSIS — Z8541 Personal history of malignant neoplasm of cervix uteri: Secondary | ICD-10-CM | POA: Diagnosis not present

## 2018-07-27 DIAGNOSIS — D61818 Other pancytopenia: Secondary | ICD-10-CM | POA: Diagnosis present

## 2018-07-27 DIAGNOSIS — K210 Gastro-esophageal reflux disease with esophagitis: Secondary | ICD-10-CM | POA: Diagnosis not present

## 2018-07-27 DIAGNOSIS — Z96652 Presence of left artificial knee joint: Secondary | ICD-10-CM | POA: Diagnosis present

## 2018-07-27 DIAGNOSIS — K5100 Ulcerative (chronic) pancolitis without complications: Secondary | ICD-10-CM | POA: Diagnosis present

## 2018-07-27 DIAGNOSIS — R197 Diarrhea, unspecified: Secondary | ICD-10-CM

## 2018-07-27 DIAGNOSIS — R933 Abnormal findings on diagnostic imaging of other parts of digestive tract: Secondary | ICD-10-CM | POA: Diagnosis not present

## 2018-07-27 DIAGNOSIS — C539 Malignant neoplasm of cervix uteri, unspecified: Secondary | ICD-10-CM | POA: Diagnosis not present

## 2018-07-27 DIAGNOSIS — Z87891 Personal history of nicotine dependence: Secondary | ICD-10-CM

## 2018-07-27 DIAGNOSIS — C799 Secondary malignant neoplasm of unspecified site: Secondary | ICD-10-CM | POA: Diagnosis not present

## 2018-07-27 DIAGNOSIS — R109 Unspecified abdominal pain: Secondary | ICD-10-CM

## 2018-07-27 DIAGNOSIS — Z923 Personal history of irradiation: Secondary | ICD-10-CM | POA: Diagnosis not present

## 2018-07-27 DIAGNOSIS — E559 Vitamin D deficiency, unspecified: Secondary | ICD-10-CM | POA: Diagnosis present

## 2018-07-27 DIAGNOSIS — E876 Hypokalemia: Secondary | ICD-10-CM | POA: Diagnosis present

## 2018-07-27 DIAGNOSIS — Z86718 Personal history of other venous thrombosis and embolism: Secondary | ICD-10-CM | POA: Diagnosis not present

## 2018-07-27 DIAGNOSIS — K3189 Other diseases of stomach and duodenum: Secondary | ICD-10-CM | POA: Diagnosis not present

## 2018-07-27 DIAGNOSIS — T451X5A Adverse effect of antineoplastic and immunosuppressive drugs, initial encounter: Secondary | ICD-10-CM | POA: Diagnosis not present

## 2018-07-27 DIAGNOSIS — Z79899 Other long term (current) drug therapy: Secondary | ICD-10-CM

## 2018-07-27 HISTORY — DX: Ulcerative (chronic) pancolitis without complications: K51.00

## 2018-07-27 LAB — COMPREHENSIVE METABOLIC PANEL
ALBUMIN: 4.2 g/dL (ref 3.5–5.0)
ALT: 58 U/L — AB (ref 0–44)
AST: 40 U/L (ref 15–41)
Alkaline Phosphatase: 153 U/L — ABNORMAL HIGH (ref 38–126)
Anion gap: 9 (ref 5–15)
BUN: 11 mg/dL (ref 6–20)
CALCIUM: 9.4 mg/dL (ref 8.9–10.3)
CHLORIDE: 103 mmol/L (ref 98–111)
CO2: 25 mmol/L (ref 22–32)
CREATININE: 0.53 mg/dL (ref 0.44–1.00)
Glucose, Bld: 155 mg/dL — ABNORMAL HIGH (ref 70–99)
Potassium: 3.9 mmol/L (ref 3.5–5.1)
SODIUM: 137 mmol/L (ref 135–145)
Total Bilirubin: 0.9 mg/dL (ref 0.3–1.2)
Total Protein: 7.7 g/dL (ref 6.5–8.1)

## 2018-07-27 LAB — CBC WITH DIFFERENTIAL/PLATELET
Abs Immature Granulocytes: 1.83 10*3/uL — ABNORMAL HIGH (ref 0.00–0.07)
BASOS ABS: 0.1 10*3/uL (ref 0.0–0.1)
Basophils Relative: 1 %
Eosinophils Absolute: 0 10*3/uL (ref 0.0–0.5)
Eosinophils Relative: 0 %
HCT: 33.2 % — ABNORMAL LOW (ref 36.0–46.0)
Hemoglobin: 11.1 g/dL — ABNORMAL LOW (ref 12.0–15.0)
IMMATURE GRANULOCYTES: 20 %
LYMPHS ABS: 0.3 10*3/uL — AB (ref 0.7–4.0)
LYMPHS PCT: 4 %
MCH: 33.2 pg (ref 26.0–34.0)
MCHC: 33.4 g/dL (ref 30.0–36.0)
MCV: 99.4 fL (ref 80.0–100.0)
Monocytes Absolute: 0.1 10*3/uL (ref 0.1–1.0)
Monocytes Relative: 1 %
NEUTROS ABS: 6.7 10*3/uL (ref 1.7–7.7)
NEUTROS PCT: 74 %
NRBC: 0 % (ref 0.0–0.2)
PLATELETS: 111 10*3/uL — AB (ref 150–400)
RBC: 3.34 MIL/uL — ABNORMAL LOW (ref 3.87–5.11)
RDW: 15.9 % — AB (ref 11.5–15.5)
Smear Review: NORMAL
WBC: 9 10*3/uL (ref 4.0–10.5)

## 2018-07-27 LAB — LACTIC ACID, PLASMA
Lactic Acid, Venous: 1.2 mmol/L (ref 0.5–1.9)
Lactic Acid, Venous: 1 mmol/L (ref 0.5–1.9)

## 2018-07-27 LAB — URINALYSIS, ROUTINE W REFLEX MICROSCOPIC
Bacteria, UA: NONE SEEN
Bilirubin Urine: NEGATIVE
Glucose, UA: NEGATIVE mg/dL
Hgb urine dipstick: NEGATIVE
Ketones, ur: 5 mg/dL — AB
Nitrite: NEGATIVE
PROTEIN: NEGATIVE mg/dL
SQUAMOUS EPITHELIAL / LPF: NONE SEEN (ref 0–5)
Specific Gravity, Urine: >1.046 — ABNORMAL HIGH (ref 1.005–1.030)
pH: 7 (ref 5.0–8.0)

## 2018-07-27 LAB — LIPASE, BLOOD: LIPASE: 38 U/L (ref 11–51)

## 2018-07-27 LAB — TROPONIN I: Troponin I: <0.03 ng/mL (ref <0.03)

## 2018-07-27 LAB — MAGNESIUM: MAGNESIUM: 1.5 mg/dL — AB (ref 1.7–2.4)

## 2018-07-27 MED ORDER — METRONIDAZOLE IN NACL 5-0.79 MG/ML-% IV SOLN
500.0000 mg | Freq: Once | INTRAVENOUS | Status: AC
  Administered 2018-07-27: 22:00:00 500 mg via INTRAVENOUS
  Filled 2018-07-27: qty 100

## 2018-07-27 MED ORDER — ALBUTEROL SULFATE (2.5 MG/3ML) 0.083% IN NEBU: 2.5000 mg | INHALATION_SOLUTION | RESPIRATORY_TRACT | Status: DC | PRN

## 2018-07-27 MED ORDER — CIPROFLOXACIN IN D5W 400 MG/200ML IV SOLN
400.0000 mg | Freq: Once | INTRAVENOUS | Status: AC
  Administered 2018-07-27: 400 mg via INTRAVENOUS
  Filled 2018-07-27: qty 200

## 2018-07-27 MED ORDER — DICYCLOMINE HCL 20 MG PO TABS
20.0000 mg | ORAL_TABLET | Freq: Four times a day (QID) | ORAL | Status: DC
  Administered 2018-07-27 – 2018-08-01 (×14): 20 mg via ORAL
  Filled 2018-07-27 (×21): qty 1

## 2018-07-27 MED ORDER — ALUM & MAG HYDROXIDE-SIMETH 200-200-20 MG/5ML PO SUSP
15.0000 mL | ORAL | Status: DC | PRN
  Administered 2018-07-27 – 2018-07-28 (×2): 15 mL via ORAL
  Filled 2018-07-27 (×2): qty 30

## 2018-07-27 MED ORDER — HYDROMORPHONE HCL 1 MG/ML IJ SOLN
1.0000 mg | Freq: Once | INTRAMUSCULAR | Status: AC
  Administered 2018-07-27: 1 mg via INTRAVENOUS
  Filled 2018-07-27: qty 1

## 2018-07-27 MED ORDER — MAGNESIUM SULFATE 2 GM/50ML IV SOLN
2.0000 g | Freq: Once | INTRAVENOUS | Status: AC
  Administered 2018-07-27: 2 g via INTRAVENOUS
  Filled 2018-07-27: qty 50

## 2018-07-27 MED ORDER — SODIUM CHLORIDE 0.9 % IV BOLUS
1000.0000 mL | Freq: Once | INTRAVENOUS | Status: AC
  Administered 2018-07-27: 1000 mL via INTRAVENOUS

## 2018-07-27 MED ORDER — TRAZODONE HCL 50 MG PO TABS: 25.0000 mg | ORAL_TABLET | Freq: Every evening | ORAL | Status: DC | PRN

## 2018-07-27 MED ORDER — ONDANSETRON HCL 4 MG PO TABS
4.0000 mg | ORAL_TABLET | Freq: Four times a day (QID) | ORAL | Status: DC | PRN
  Administered 2018-07-31: 10:00:00 4 mg via ORAL
  Filled 2018-07-27: qty 1

## 2018-07-27 MED ORDER — CIPROFLOXACIN IN D5W 400 MG/200ML IV SOLN
400.0000 mg | Freq: Two times a day (BID) | INTRAVENOUS | Status: DC
  Administered 2018-07-28: 400 mg via INTRAVENOUS
  Filled 2018-07-27 (×3): qty 200

## 2018-07-27 MED ORDER — SERTRALINE HCL 50 MG PO TABS
50.0000 mg | ORAL_TABLET | Freq: Every day | ORAL | Status: DC
  Administered 2018-07-28 – 2018-08-01 (×4): 50 mg via ORAL
  Filled 2018-07-27 (×4): qty 1

## 2018-07-27 MED ORDER — METRONIDAZOLE IN NACL 5-0.79 MG/ML-% IV SOLN
500.0000 mg | Freq: Three times a day (TID) | INTRAVENOUS | Status: DC
  Administered 2018-07-28: 500 mg via INTRAVENOUS
  Filled 2018-07-27 (×3): qty 100

## 2018-07-27 MED ORDER — POTASSIUM CHLORIDE IN NACL 20-0.9 MEQ/L-% IV SOLN
INTRAVENOUS | Status: DC
  Administered 2018-07-27: 17:00:00 via INTRAVENOUS
  Filled 2018-07-27: qty 1000

## 2018-07-27 MED ORDER — ACETAMINOPHEN 325 MG PO TABS: 650.0000 mg | ORAL_TABLET | Freq: Four times a day (QID) | ORAL | Status: DC | PRN

## 2018-07-27 MED ORDER — IOPAMIDOL (ISOVUE-300) INJECTION 61%
100.0000 mL | Freq: Once | INTRAVENOUS | Status: AC | PRN
  Administered 2018-07-27: 100 mL via INTRAVENOUS

## 2018-07-27 MED ORDER — HYDROCODONE-ACETAMINOPHEN 5-325 MG PO TABS
1.0000 | ORAL_TABLET | ORAL | Status: DC | PRN
  Administered 2018-07-27 – 2018-08-01 (×11): 2 via ORAL
  Filled 2018-07-27 (×11): qty 2

## 2018-07-27 MED ORDER — IOPAMIDOL (ISOVUE-300) INJECTION 61%
30.0000 mL | Freq: Once | INTRAVENOUS | Status: AC | PRN
  Administered 2018-07-27: 30 mL via ORAL

## 2018-07-27 MED ORDER — ONDANSETRON HCL 4 MG/2ML IJ SOLN
4.0000 mg | Freq: Four times a day (QID) | INTRAMUSCULAR | Status: DC | PRN
  Administered 2018-07-28 – 2018-08-01 (×7): 4 mg via INTRAVENOUS
  Filled 2018-07-27 (×8): qty 2

## 2018-07-27 MED ORDER — LORATADINE 10 MG PO TABS
10.0000 mg | ORAL_TABLET | Freq: Every day | ORAL | Status: DC
  Administered 2018-07-27 – 2018-08-01 (×5): 10 mg via ORAL
  Filled 2018-07-27 (×5): qty 1

## 2018-07-27 MED ORDER — RIVAROXABAN 20 MG PO TABS
20.0000 mg | ORAL_TABLET | Freq: Every day | ORAL | Status: DC
  Administered 2018-07-27 – 2018-07-31 (×5): 20 mg via ORAL
  Filled 2018-07-27 (×6): qty 1

## 2018-07-27 MED ORDER — COLESTIPOL HCL 1 G PO TABS
2.0000 g | ORAL_TABLET | Freq: Two times a day (BID) | ORAL | Status: DC
  Administered 2018-07-27 – 2018-07-29 (×5): 2 g via ORAL
  Filled 2018-07-27: qty 2
  Filled 2018-07-27: qty 1
  Filled 2018-07-27 (×6): qty 2

## 2018-07-27 MED ORDER — ONDANSETRON HCL 4 MG/2ML IJ SOLN
4.0000 mg | Freq: Once | INTRAMUSCULAR | Status: AC
  Administered 2018-07-27: 4 mg via INTRAVENOUS
  Filled 2018-07-27: qty 2

## 2018-07-27 MED ORDER — ACETAMINOPHEN 650 MG RE SUPP: 650.0000 mg | Freq: Four times a day (QID) | RECTAL | Status: DC | PRN

## 2018-07-27 MED ORDER — PROMETHAZINE HCL 25 MG/ML IJ SOLN
12.5000 mg | Freq: Four times a day (QID) | INTRAMUSCULAR | Status: DC | PRN
  Administered 2018-07-27 – 2018-07-28 (×3): 12.5 mg via INTRAVENOUS
  Filled 2018-07-27 (×4): qty 1

## 2018-07-27 MED ORDER — SODIUM CHLORIDE 0.9 % IV SOLN
INTRAVENOUS | Status: DC
  Administered 2018-07-27: 23:00:00 via INTRAVENOUS

## 2018-07-27 NOTE — ED Notes (Signed)
ED Provider at bedside. 

## 2018-07-27 NOTE — H&P (Signed)
[facility] [place] NAME: Joanna Hall    MR#:  [id number]  DATE OF BIRTH:  1966/07/08  DATE OF ADMISSION:  07/27/2018  PRIMARY CARE PHYSICIAN: Arnetha Courser, MD   REQUESTING/REFERRING PHYSICIAN: Dr. Kerman Passey  CHIEF COMPLAINT:   Chief Complaint  Patient presents with  . Emesis  . Diarrhea   Abdominal pain, nausea, vomiting and diarrhea for 3 days. HISTORY OF PRESENT ILLNESS:  Joanna Hall  is a 52 y.o. female with a known history of lung cancer on chemotherapy,  factor V Leiden, VTE, GERD, hepatitis C, interesting infection due to Clostridium difficile, PNA etc. the patient presented to ED with above chief complaints.  The abdominal pain is diffuse, intermittent and crampy without radiation, associated with nausea and vomiting.  The patient has diarrhea due to chemotherapy.  The CAT scan of the abdomen show acute pancolitis.  Dr. Susy Manor does not think this is related to chemotherapy.  The patient is treated with Cipro and Flagyl IV in the ED.  PAST MEDICAL HISTORY:   Past Medical History:  Diagnosis Date  . Abnormal cervical Papanicolaou smear 09/18/2017  . Anxiety   . Aortic atherosclerosis ([place])   . Arthritis    neck and knees  . 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 Blood transfusion without reported diagnosis   . Cervical cancer (HCC)    mets lung  . Chronic anal fissure   . Chronic diarrhea   . Dyspnea   . Factor V Leiden mutation ([place])   . Fecal incontinence   . Genital warts   . GERD (gastroesophageal reflux disease)   . Heart murmur   . Hemorrhoids   . 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 History of cancer chemotherapy    completed 06/2017  . History of Clostridium difficile infection    while undergoing chemo.  Negative test 10/2017  . 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 Intestinal infection due to Clostridium difficile 09/18/2017    . Macrocytic anemia with vitamin B12 deficiency   . Perianal condylomata   . Pneumonia    History of  . Pulmonary nodules   . Rectal bleeding   . Small bowel obstruction ([place]) 08/2017  . Stiff neck    limited right turn  . Vitamin D deficiency     PAST SURGICAL HISTORY:   Past Surgical History:  Procedure Laterality Date  . CHOLECYSTECTOMY    . COLON SURGERY  08/2017   resection  . COLONOSCOPY WITH PROPOFOL N/A 12/20/2017   Procedure: COLONOSCOPY WITH PROPOFOL;  Surgeon: [name], MD;  Location: [facility] ENDOSCOPY;  Service: Gastroenterology;  Laterality: N/A;  . DIAGNOSTIC LAPAROSCOPY    . ESOPHAGOGASTRODUODENOSCOPY (EGD) WITH PROPOFOL N/A 12/20/2017   Procedure: ESOPHAGOGASTRODUODENOSCOPY (EGD) WITH PROPOFOL;  Surgeon: [name], MD;  Location: [facility];  Service: Gastroenterology;  Laterality: N/A;  . LAPAROTOMY N/A 08/31/2017   Procedure: EXPLORATORY LAPAROTOMY for SBO, ileocolectomy, removal of piece of uterine wall;  Surgeon: [name], MD;  Location: [facility] ORS;  Service: General;  Laterality: N/A;  . LASER ABLATION CONDOLAMATA N/A 02/22/2018   Procedure: LASER ABLATION/REMOVAL OF SRPRXYVOPFY AROUND ANUS AND VAGINA;  Surgeon: Michael Boston, MD;  Location: [place];  Service: General;  Laterality: N/A;  . OOPHORECTOMY    . PORTA CATH INSERTION N/A 05/13/2018   Procedure:  PORTA CATH INSERTION;  Surgeon: Katha Cabal, MD;  Location: [facility];  Service: Cardiovascular;  Laterality: N/A;  . SMALL INTESTINE SURGERY    . TANDEM RING INSERTION     x3  . THORACOTOMY    . TOTAL KNEE ARTHROPLASTY Left 04/24/2018   Procedure: TOTAL KNEE ARTHROPLASTY;  Surgeon: Lovell Sheehan, MD;  Location: [facility] ORS;  Service: Orthopedics;  Laterality: Left;    SOCIAL HISTORY:   Social History   Tobacco Use  . Smoking status: Former Smoker    Last attempt to quit: 10/16/2006    Years since quitting: 11.7  . Smokeless tobacco: Never Used   Substance Use Topics  . Alcohol use: Yes    Frequency: Never    Comment: seldom    FAMILY HISTORY:   Family History  Problem Relation Age of Onset  . Hypertension Father   . Diabetes Father   . Alcohol abuse Daughter   . Hypertension Maternal Grandmother   . Diabetes Maternal Grandmother   . Diabetes Paternal Grandmother   . Hypertension Paternal Grandmother     DRUG ALLERGIES:   Allergies  Allergen Reactions  . Ketamine Anxiety and Other (See Comments)    Syncope episode/confusion     REVIEW OF SYSTEMS:   Review of Systems  Constitutional: Positive for malaise/fatigue. Negative for chills and fever.  HENT: Negative for sore throat.   Eyes: Negative for blurred vision and double vision.  Respiratory: Negative for cough, hemoptysis, shortness of breath, wheezing and stridor.   Cardiovascular: Negative for chest pain, palpitations, orthopnea and leg swelling.  Gastrointestinal: Positive for abdominal pain, blood in stool, diarrhea, nausea and vomiting. Negative for melena.  Genitourinary: Negative for dysuria, flank pain and hematuria.  Musculoskeletal: Negative for back pain and joint pain.  Neurological: Negative for dizziness, sensory change, focal weakness, seizures, loss of consciousness, weakness and headaches.  Endo/Heme/Allergies: Negative for polydipsia.  Psychiatric/Behavioral: Negative for depression. The patient is not nervous/anxious.     MEDICATIONS AT HOME:   Prior to Admission medications   Medication Sig Start Date End Date Taking? Authorizing Provider  Calcium Carb-Cholecalciferol (CALCIUM 500 +D) 500-400 MG-UNIT TABS Take 2 tablets by mouth daily.    [provider]  colestipol (COLESTID) 1 g tablet Take 2 tablets (2 g total) by mouth 2 (two) times daily. 06/12/18   Hillary Bow, MD  cyanocobalamin (,VITAMIN B-12,) 1000 MCG/ML injection Inject 1ML every week x 4 weeks, then 1ML every other week x 2 months, then monthly x 3 months Patient  taking differently: Inject 1,000 mcg into the muscle every 30 (thirty) days. Inject 1ML every week x 4 weeks, then 1ML every other week x 2 months, then monthly x 3 months 11/29/17   [name], MD  dicyclomine (BENTYL) 20 MG tablet Take 1 tablet (20 mg total) by mouth every 6 (six) hours. 05/23/18   Jacquelin Hawking, NP  diphenoxylate-atropine (LOMOTIL) 2.5-0.025 MG tablet Take 2 tablets by mouth 4 (four) times daily. 07/09/18 08/08/18  [name], MD  fluticasone (FLONASE) 50 MCG/ACT nasal spray Place 2 sprays into both nostrils daily. Patient not taking: Reported on 07/23/2018 02/11/18   Steele Sizer, MD  ibuprofen (ADVIL,MOTRIN) 600 MG tablet Take 1 tablet (600 mg total) by mouth every 8 (eight) hours as needed. Patient not taking: Reported on 07/23/2018 07/03/18   Paulette Blanch, MD  loratadine (CLARITIN) 10 MG tablet Take 10 mg by mouth daily.    [provider]  ondansetron (ZOFRAN) 8 MG tablet Take 1 tablet (8 mg total) by mouth every 8 (eight) hours as needed for nausea or vomiting. 07/23/18   Karen Kitchens, NP  promethazine (PHENERGAN) 25 MG tablet Take 1 tablet (25 mg total) by mouth every 6 (six) hours as needed for nausea or vomiting. 07/25/18   Karen Kitchens, NP  rivaroxaban (XARELTO) 20 MG TABS tablet Take 1 tablet (20 mg total) by mouth daily with supper. 03/13/18   Lequita Asal, MD  sertraline (ZOLOFT) 50 MG tablet Take 1 tablet (50 mg total) by mouth daily. 04/02/18   Lada, Satira Anis, MD  Syringe/Needle, Disp, (SYRINGE 3CC/22GX1") 22G X 1" 3 ML MISC For use with Vitamin B12 injections 04/12/18   Vanga, Tally Due, MD  traMADol (ULTRAM) 50 MG tablet Take 1 tablet (50 mg total) by mouth every 6 (six) hours as needed. 07/18/18   Karen Kitchens, NP  traZODone (DESYREL) 50 MG tablet Take 0.5-1 tablets (25-50 mg total) by mouth at bedtime as needed for sleep. 07/25/18   Karen Kitchens, NP  VENTOLIN HFA 108 (90 Base) MCG/ACT inhaler Inhale 1-2 puffs into the lungs  every 4 (four) hours as needed for shortness of breath.     [provider]      VITAL SIGNS:  Blood pressure 135/83, pulse 76, temperature 98 F (36.7 C), temperature source Oral, resp. rate 14, height 5' 7"  (1.702 m), weight 72.6 kg, SpO2 99 %.  PHYSICAL EXAMINATION:  Physical Exam  GENERAL:  52 y.o.-year-old patient lying in the bed with no acute distress.  EYES: Pupils equal, round, reactive to light and accommodation. No scleral icterus. Extraocular muscles intact.  HEENT: Head atraumatic, normocephalic. Oropharynx and nasopharynx clear.  NECK:  Supple, no jugular venous distention. No thyroid enlargement, no tenderness.  LUNGS: Normal breath sounds bilaterally, no wheezing, rales,rhonchi or crepitation. No use of accessory muscles of respiration.  CARDIOVASCULAR: S1, S2 normal. No murmurs, rubs, or gallops.  ABDOMEN: Soft, diffuse tenderness, nondistended. Bowel sounds present. No organomegaly or mass.  EXTREMITIES: No pedal edema, cyanosis, or clubbing.  NEUROLOGIC: Cranial nerves II through XII are intact. Muscle strength 5/5 in all extremities. Sensation intact. Gait not checked.  PSYCHIATRIC: The patient is alert and oriented x 3.  SKIN: No obvious rash, lesion, or ulcer.   LABORATORY PANEL:   CBC Recent Labs  Lab 07/27/18 1042  WBC 9.0  HGB 11.1*  HCT 33.2*  PLT 111*   ------------------------------------------------------------------------------------------------------------------  Chemistries  Recent Labs  Lab 07/27/18 1042  NA 137  K 3.9  CL 103  CO2 25  GLUCOSE 155*  BUN 11  CREATININE 0.53  CALCIUM 9.4  MG 1.5*  AST 40  ALT 58*  ALKPHOS 153*  BILITOT 0.9   ------------------------------------------------------------------------------------------------------------------  Cardiac Enzymes Recent Labs  Lab 07/27/18 1042  TROPONINI <0.03    ------------------------------------------------------------------------------------------------------------------  RADIOLOGY:  Ct Abdomen Pelvis W Contrast  Result Date: 07/27/2018 CLINICAL DATA:  Pt with generalized abd/pelvic pain and N/V/D since last night. Pt states cervical ca and is having treatments ^157m ISOVUE-300 IOPAMIDOL (ISOVUE-300) INJECTION 61% EXAM: CT ABDOMEN AND PELVIS WITH CONTRAST TECHNIQUE: Multidetector CT imaging of the abdomen and pelvis was performed using the standard protocol following bolus administration of intravenous contrast. CONTRAST:  1041mISOVUE-300 IOPAMIDOL (ISOVUE-300) INJECTION 61% COMPARISON:  None. FINDINGS: Lower chest: Multiple small nodules again appreciated at the bilateral lung bases, some cavitary, compatible with previously described known metastatic disease. No new findings at either lung base.  No pericardial effusion at the heart base. Hepatobiliary: No focal liver abnormality is seen. Status post cholecystectomy. No biliary dilatation. Pancreas: Unremarkable. No pancreatic ductal dilatation or surrounding inflammatory changes. Spleen: Normal in size without focal abnormality. Adrenals/Urinary Tract: Adrenal glands appear normal. Kidneys appear normal without mass, stone or hydronephrosis. Bladder appears normal. Stomach/Bowel: Status post RIGHT hemicolectomy. There is thickening of the walls of the entire colon, most prominent within the transverse colon, indicating colitis. No dilated large or small bowel loops. Stomach is unremarkable. Vascular/Lymphatic: Aortic atherosclerosis. No enlarged abdominal or pelvic lymph nodes. Reproductive: No adnexal mass or free fluid. Other: Fluid stranding within the lower pelvis, likely treatment related. No abscess collection seen. No free intraperitoneal air. Musculoskeletal: No acute or suspicious osseous finding. Mild degenerative spondylitic changes throughout the slightly scoliotic thoracolumbar spine.  IMPRESSION: 1. 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 2. No acute or significant findings elsewhere within the abdomen or pelvis. No bowel obstruction. No abscess collection. No free intraperitoneal air. No evidence of acute solid organ abnormality. 3. Stable small nodules at the lung bases, some cavitary, compatible with previously described known metastatic disease. 4. Aortic Atherosclerosis (ICD10-I70.0). Electronically Signed   By: Franki Cabot M.D.   On: 07/27/2018 13:42      IMPRESSION AND PLAN:   Acute pancolitis. The patient will be admitted to medical floor. Continue Cipro and Flagyl, Zofran as needed, pain control, follow-up CBC, and stool test.  GI consult.  Hypomagnesemia.  IV magnesium in the follow-up level.  History of lung cancer, oncology consult. VTE and factor V Leiden mutation.  Continue Xarelto.  All the records are reviewed and case discussed with ED provider. Management plans discussed with the patient, family and they are in agreement.  CODE STATUS: Full code.  TOTAL TIME TAKING CARE OF THIS PATIENT: 37 minutes.    Demetrios Loll M.D on 07/27/2018 at 3:00 PM  Between 7am to 6pm - Pager - [phone]  After 6pm go to [email or web address] - Proofreader  [facility]  Office  [phone]  CC: Primary care physician; Arnetha Courser, MD   Note: This dictation was prepared with Dragon dictation along with smaller phrase technology. Any transcriptional errors that result from this process are unin

## 2018-07-27 NOTE — ED Notes (Signed)
Patient is resting comfortably. 

## 2018-07-27 NOTE — ED Provider Notes (Signed)
[facility] Emergency Department Provider Note  Time seen: 10:20 AM  I have reviewed the triage vital signs and the nursing notes.   HISTORY  Chief Complaint Emesis and Diarrhea    HPI Joanna Hall is a 52 y.o. female with a past medical history of anxiety, factor V Leiden, gastric reflux, currently on chemotherapy for lung cancer who presents to the emergency department for nausea, weakness, vomiting, diarrhea and abdominal discomfort.  According to the patient over the past 2 to 3 days she has been experiencing generalized abdominal pain, nausea with intermittent episodes of vomiting, and fairly frequent diarrhea.  Patient received chemotherapy on Tuesday this is the third round of her current cycle.  Patient states she has had similar responses to chemotherapy in the past but this is the worst she is experienced yet.  Denies any fever at home.  Largely negative review of systems otherwise.   Past Medical History:  Diagnosis Date  . Abnormal cervical Papanicolaou smear 09/18/2017  . Anxiety   . Aortic atherosclerosis ([place])   . Arthritis    neck and knees  . 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 Blood transfusion without reported diagnosis   . Cervical cancer (HCC)    mets lung  . Chronic anal fissure   . Chronic diarrhea   . Dyspnea   . Factor V Leiden mutation ([place])   . Fecal incontinence   . Genital warts   . GERD (gastroesophageal reflux disease)   . Heart murmur   . Hemorrhoids   . 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 History of cancer chemotherapy    completed 06/2017  . History of Clostridium difficile infection    while undergoing chemo.  Negative test 10/2017  . Infarction of kidney ([place]) left kidney   and uterus  . Intestinal infection due to Clostridium difficile 09/18/2017  . Macrocytic anemia with vitamin B12 deficiency   . Perianal condylomata   . Pneumonia    History of  .  Pulmonary nodules   . Rectal bleeding   . Small bowel obstruction ([place]) 08/2017  . Stiff neck    limited right turn  . Vitamin D deficiency     Patient Active Problem List   Diagnosis Date Noted  . Chronic diarrhea 06/25/2018  . Chronic anticoagulation 06/25/2018  . Vomiting   . Abdominal pain 06/10/2018  . Encounter for antineoplastic chemotherapy 06/04/2018  . 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 Lung metastasis ([place]) 05/15/2018  . Closed fracture of distal end of radius 05/04/2018  . History of total knee arthroplasty 04/24/2018  . Osteoarthritis of left knee 02/28/2018  . Condyloma acuminatum of anus s/p ablation 02/22/2018 02/22/2018  . Condyloma acuminatum of vagina s/p ablation 02/22/2018 02/22/2018  . Positive ANA (antinuclear antibody) 02/04/2018  . Aortic atherosclerosis ([place]) 01/15/2018  . Elevated MCV 01/15/2018  . Anemia 01/15/2018  . Genital warts 01/15/2018  . Vitamin D deficiency 01/15/2018  . Pernicious anemia   . Impingement syndrome of shoulder region 12/19/2017  . Multiple joint pain 12/19/2017  . Osteoarthritis of right knee 12/19/2017  . B12 deficiency 12/11/2017  . Lung nodules 12/11/2017  . History of Clostridium difficile infection 10/23/2017  . Factor V Leiden ([place]) 10/19/2017  . Goals of care, counseling/discussion 10/19/2017  . Cervical cancer, FIGO stage IB1 ([place]) 09/18/2017  . Chronic hepatitis C without hepatic coma ([place]) 09/18/2017  .  Cytopenia 09/18/2017  . Diarrhea 09/18/2017  . Erosive gastropathy 09/18/2017  . Lower abdominal pain 09/18/2017  . 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 Malignant neoplasm of overlapping sites of cervix ([place]) 09/18/2017  . Renal insufficiency 09/18/2017  . Wound infection after surgery 09/12/2017  . Hypokalemia   . Hypomagnesemia   . Ileocolic anastomotic leak   . Cervical arthritis 07/18/2017  . Dysuria 06/20/2017  . Metastatic cancer ([place]) 05/12/2017  . Hepatitis, chronic ([place]) 05/05/2017  .  Essential hypertension 03/15/2017  . Anemia in other chronic diseases classified elsewhere 03/01/2017  . Chemotherapy-induced neutropenia ([place]) 01/29/2017  . 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 CHOLECYSTECTOMY    . COLON SURGERY  08/2017   resection  . COLONOSCOPY WITH PROPOFOL N/A 12/20/2017   Procedure: COLONOSCOPY WITH PROPOFOL;  Surgeon: [name], MD;  Location: [facility] ENDOSCOPY;  Service: Gastroenterology;  Laterality: N/A;  . DIAGNOSTIC LAPAROSCOPY    . ESOPHAGOGASTRODUODENOSCOPY (EGD) WITH PROPOFOL N/A 12/20/2017   Procedure: ESOPHAGOGASTRODUODENOSCOPY (EGD) WITH PROPOFOL;  Surgeon: [name], MD;  Location: [place];  Service: Gastroenterology;  Laterality: N/A;  . LAPAROTOMY N/A 08/31/2017   Procedure: EXPLORATORY LAPAROTOMY for SBO, ileocolectomy, removal of piece of uterine wall;  Surgeon: [name], MD;  Location: [facility] ORS;  Service: General;  Laterality: N/A;  . LASER ABLATION CONDOLAMATA N/A 02/22/2018   Procedure: LASER ABLATION/REMOVAL OF MVHQIONGEXB AROUND ANUS AND VAGINA;  Surgeon: Michael Boston, MD;  Location: [place];  Service: General;  Laterality: N/A;  . OOPHORECTOMY    . PORTA CATH INSERTION N/A 05/13/2018   Procedure: PORTA CATH INSERTION;  Surgeon: Katha Cabal, MD;  Location: [place] CV LAB;  Service: Cardiovascular;  Laterality: N/A;  . SMALL INTESTINE SURGERY    . TANDEM RING INSERTION     x3  . THORACOTOMY    . TOTAL KNEE ARTHROPLASTY Left 04/24/2018   Procedure: TOTAL KNEE ARTHROPLASTY;  Surgeon: Lovell Sheehan, MD;  Location: [facility] ORS;  Service: Orthopedics;  Laterality: Left;    Prior to Admission medications   Medication Sig Start Date End Date Taking? Authorizing Provider  Calcium Carb-Cholecalciferol (CALCIUM 500 +D) 500-400 MG-UNIT TABS Take 2 tablets by mouth daily.    [provider]  colestipol (COLESTID) 1 g  tablet Take 2 tablets (2 g total) by mouth 2 (two) times daily. 06/12/18   Hillary Bow, MD  cyanocobalamin (,VITAMIN B-12,) 1000 MCG/ML injection Inject 1ML every week x 4 weeks, then 1ML every other week x 2 months, then monthly x 3 months Patient taking differently: Inject 1,000 mcg into the muscle every 30 (thirty) days. Inject 1ML every week x 4 weeks, then 1ML every other week x 2 months, then monthly x 3 months 11/29/17   [name], MD  dicyclomine (BENTYL) 20 MG tablet Take 1 tablet (20 mg total) by mouth every 6 (six) hours. 05/23/18   Jacquelin Hawking, NP  diphenoxylate-atropine (LOMOTIL) 2.5-0.025 MG tablet Take 2 tablets by mouth 4 (four) times daily. 07/09/18 08/08/18  [name], MD  fluticasone (FLONASE) 50 MCG/ACT nasal spray Place 2 sprays into both nostrils daily. Patient not taking: Reported on 07/23/2018 02/11/18   Steele Sizer, MD  ibuprofen (ADVIL,MOTRIN) 600 MG tablet Take 1 tablet (600 mg total) by mouth every 8 (eight) hours as needed. Patient not taking: Reported on 07/23/2018 07/03/18   Paulette Blanch, MD  loratadine (CLARITIN) 10 MG tablet Take  10 mg by mouth daily.    [provider]  ondansetron (ZOFRAN) 8 MG tablet Take 1 tablet (8 mg total) by mouth every 8 (eight) hours as needed for nausea or vomiting. 07/23/18   Karen Kitchens, NP  promethazine (PHENERGAN) 25 MG tablet Take 1 tablet (25 mg total) by mouth every 6 (six) hours as needed for nausea or vomiting. 07/25/18   Karen Kitchens, NP  rivaroxaban (XARELTO) 20 MG TABS tablet Take 1 tablet (20 mg total) by mouth daily with supper. 03/13/18   Lequita Asal, MD  sertraline (ZOLOFT) 50 MG tablet Take 1 tablet (50 mg total) by mouth daily. 04/02/18   Lada, Satira Anis, MD  Syringe/Needle, Disp, (SYRINGE 3CC/22GX1") 22G X 1" 3 ML MISC For use with Vitamin B12 injections 04/12/18   Vanga, Tally Due, MD  traMADol (ULTRAM) 50 MG tablet Take 1 tablet (50 mg total) by mouth every 6 (six) hours as  needed. 07/18/18   Karen Kitchens, NP  traZODone (DESYREL) 50 MG tablet Take 0.5-1 tablets (25-50 mg total) by mouth at bedtime as needed for sleep. 07/25/18   Karen Kitchens, NP  VENTOLIN HFA 108 (90 Base) MCG/ACT inhaler Inhale 1-2 puffs into the lungs every 4 (four) hours as needed for shortness of breath.     [provider]    Allergies  Allergen Reactions  . Ketamine Anxiety and Other (See Comments)    Syncope episode/confusion     Family History  Problem Relation Age of Onset  . Hypertension Father   . Diabetes Father   . Alcohol abuse Daughter   . Hypertension Maternal Grandmother   . Diabetes Maternal Grandmother   . Diabetes Paternal Grandmother   . Hypertension Paternal Grandmother     Social History Social History   Tobacco Use  . Smoking status: Former Smoker    Last attempt to quit: 10/16/2006    Years since quitting: 11.7  . Smokeless tobacco: Never Used  Substance Use Topics  . Alcohol use: Yes    Frequency: Never    Comment: seldom  . Drug use: Yes    Types: Marijuana    Review of Systems Constitutional: Negative for fever.  Positive for weakness. Cardiovascular: Negative for chest pain. Respiratory: Negative for shortness of breath. Gastrointestinal: Generalized abdominal pain/cramping, moderate.  Positive for intermittent nausea vomiting, positive for intermittent diarrhea Genitourinary: Negative for urinary compaints Musculoskeletal: Body aches Skin: Negative for skin complaints  Neurological: Negative for headache All other ROS negative  ____________________________________________   PHYSICAL EXAM:  VITAL SIGNS: ED Triage Vitals  Enc Vitals Group     BP 07/27/18 1017 (!) 166/113     Pulse Rate 07/27/18 1017 63     Resp 07/27/18 1017 16     Temp 07/27/18 1017 98 F (36.7 C)     Temp Source 07/27/18 1017 Oral     SpO2 07/27/18 1017 100 %     Weight 07/27/18 1019 160 lb (72.6 kg)     Height 07/27/18 1019 5' 7"  (1.702 m)     Head  Circumference --      Peak Flow --      Pain Score 07/27/18 1017 10     Pain Loc --      Pain Edu? --      Excl. in [place]? --    Constitutional: Alert and oriented. Well appearing and in no distress. Eyes: Normal exam ENT   Head: Normocephalic and atraumatic.   Mouth/Throat:  Mucous membranes are moist. Cardiovascular: Normal rate, regular rhythm. No murmur Respiratory: Normal respiratory effort without tachypnea nor retractions. Breath sounds are clear  Gastrointestinal: Soft, mild to moderate diffuse tenderness palpation without rebound guarding or distention. Musculoskeletal: Nontender with normal range of motion in all extremities.  Neurologic:  Normal speech and language. No gross focal neurologic deficits  Skin:  Skin is warm, dry and intact.  Psychiatric: Mood and affect are normal.   ____________________________________________   RADIOLOGY  CT scan shows diffuse colitis  ____________________________________________   INITIAL IMPRESSION / ASSESSMENT AND PLAN / ED COURSE  Pertinent labs & imaging results that were available during my care of the patient were reviewed by me and considered in my medical decision making (see chart for details).  Patient presents to the emergency department for generalized weakness, abdominal pain/cramps, nausea, vomiting, diarrhea approximately 4 days status post chemotherapy.  Differential this time would include chemotherapy-induced reaction, dehydration, electrolyte or metabolic abnormality, ACS, infectious etiology.  Reassuringly patient is afebrile with a normal heart rate largely normal vitals besides hypertension.  We will check labs including lactic acid, obtain an EKG, IV hydrate, treat pain and nausea and continue to closely monitor.  Labs are largely within normal limits, CT scan shows diffuse colitis.  I discussed the patient with Dr. Mike Gip of oncology who states unlikely related to chemotherapy.  Given the pancolitis on CT  imaging will start the patient on IV antibiotics continue with pain management and admit to the hospitalist service for further treatment.  Patient agreeable to plan of care.  ____________________________________________   FINAL CLINICAL IMPRESSION(S) / ED DIAGNOSES  Nausea vomiting diarrhea Generalized weakness Abdominal pain Pancolitis   Harvest Dark, MD 07/27/18 1415

## 2018-07-27 NOTE — ED Triage Notes (Signed)
Pt here with c/o vomiting, pain, diarrhea that began last pm. Pt is receiving chemo treatments for cervical cancer with mets. Pt stating "please help me, I am in severe pain." Pale upon arrival. No respiratory distress at this time.

## 2018-07-27 NOTE — Progress Notes (Signed)
Advanced Care Plan.  Purpose of Encounter: CODE STATUS Parties in Attendance: The patient and me. Patient's Decisional Capacity: Yes. Medical Story: Joanna Hall  is a 53 y.o. female with a known history of lung cancer on chemotherapy, factor V Leiden, VTE, GERD, hepatitis C, interesting infection due to Clostridium difficile, PNA etc. she is being admitted for acute pancolitis.  I discussed with her about her current condition, prognosis and CODE STATUS.  The patient want to be resuscitated and intubated to get her back. Plan:  Code Status: Full code. Time spent discussing advance care planning: 17 minutes.

## 2018-07-28 ENCOUNTER — Other Ambulatory Visit: Payer: Self-pay | Admitting: Hematology and Oncology

## 2018-07-28 DIAGNOSIS — C539 Malignant neoplasm of cervix uteri, unspecified: Secondary | ICD-10-CM

## 2018-07-28 DIAGNOSIS — T451X5A Adverse effect of antineoplastic and immunosuppressive drugs, initial encounter: Secondary | ICD-10-CM

## 2018-07-28 DIAGNOSIS — K521 Toxic gastroenteritis and colitis: Principal | ICD-10-CM

## 2018-07-28 DIAGNOSIS — C799 Secondary malignant neoplasm of unspecified site: Secondary | ICD-10-CM

## 2018-07-28 DIAGNOSIS — Z87891 Personal history of nicotine dependence: Secondary | ICD-10-CM

## 2018-07-28 LAB — BASIC METABOLIC PANEL
Anion gap: 6 (ref 5–15)
BUN: 7 mg/dL (ref 6–20)
CO2: 25 mmol/L (ref 22–32)
Calcium: 8.4 mg/dL — ABNORMAL LOW (ref 8.9–10.3)
Chloride: 106 mmol/L (ref 98–111)
Creatinine, Ser: 0.59 mg/dL (ref 0.44–1.00)
Glucose, Bld: 95 mg/dL (ref 70–99)
POTASSIUM: 3.4 mmol/L — AB (ref 3.5–5.1)
SODIUM: 137 mmol/L (ref 135–145)

## 2018-07-28 LAB — GASTROINTESTINAL PANEL BY PCR, STOOL (REPLACES STOOL CULTURE)
ADENOVIRUS F40/41: NOT DETECTED
ASTROVIRUS: NOT DETECTED
CYCLOSPORA CAYETANENSIS: NOT DETECTED
Campylobacter species: NOT DETECTED
Cryptosporidium: NOT DETECTED
ENTEROAGGREGATIVE E COLI (EAEC): NOT DETECTED
ENTEROPATHOGENIC E COLI (EPEC): NOT DETECTED
Entamoeba histolytica: NOT DETECTED
Enterotoxigenic E coli (ETEC): NOT DETECTED
GIARDIA LAMBLIA: NOT DETECTED
Norovirus GI/GII: NOT DETECTED
Plesimonas shigelloides: NOT DETECTED
Rotavirus A: NOT DETECTED
Salmonella species: NOT DETECTED
Sapovirus (I, II, IV, and V): NOT DETECTED
Shiga like toxin producing E coli (STEC): NOT DETECTED
Shigella/Enteroinvasive E coli (EIEC): NOT DETECTED
VIBRIO CHOLERAE: NOT DETECTED
VIBRIO SPECIES: NOT DETECTED
YERSINIA ENTEROCOLITICA: NOT DETECTED

## 2018-07-28 LAB — PHOSPHORUS: Phosphorus: 3.4 mg/dL (ref 2.5–4.6)

## 2018-07-28 LAB — CBC
HCT: 26.6 % — ABNORMAL LOW (ref 36.0–46.0)
HEMOGLOBIN: 8.8 g/dL — AB (ref 12.0–15.0)
MCH: 33.7 pg (ref 26.0–34.0)
MCHC: 33.1 g/dL (ref 30.0–36.0)
MCV: 101.9 fL — ABNORMAL HIGH (ref 80.0–100.0)
NRBC: 0 % (ref 0.0–0.2)
Platelets: 72 10*3/uL — ABNORMAL LOW (ref 150–400)
RBC: 2.61 MIL/uL — ABNORMAL LOW (ref 3.87–5.11)
RDW: 15.9 % — AB (ref 11.5–15.5)
WBC: 4.2 10*3/uL (ref 4.0–10.5)

## 2018-07-28 LAB — C DIFFICILE QUICK SCREEN W PCR REFLEX
C DIFFICILE (CDIFF) INTERP: NOT DETECTED
C Diff antigen: NEGATIVE
C Diff toxin: NEGATIVE

## 2018-07-28 LAB — MAGNESIUM: Magnesium: 1.5 mg/dL — ABNORMAL LOW (ref 1.7–2.4)

## 2018-07-28 MED ORDER — POTASSIUM CHLORIDE IN NACL 20-0.9 MEQ/L-% IV SOLN
INTRAVENOUS | Status: DC
  Administered 2018-07-28 – 2018-07-31 (×7): via INTRAVENOUS
  Filled 2018-07-28 (×10): qty 1000

## 2018-07-28 MED ORDER — ADULT MULTIVITAMIN W/MINERALS CH
1.0000 | ORAL_TABLET | Freq: Every day | ORAL | Status: DC
  Administered 2018-07-29 – 2018-07-31 (×2): 1 via ORAL
  Filled 2018-07-28 (×2): qty 1

## 2018-07-28 MED ORDER — HYDROMORPHONE HCL 1 MG/ML IJ SOLN
1.0000 mg | Freq: Once | INTRAMUSCULAR | Status: AC
  Administered 2018-07-28: 21:00:00 1 mg via INTRAVENOUS
  Filled 2018-07-28: qty 1

## 2018-07-28 MED ORDER — BOOST / RESOURCE BREEZE PO LIQD CUSTOM
1.0000 | Freq: Three times a day (TID) | ORAL | Status: DC
  Administered 2018-07-29 – 2018-07-31 (×4): 1 via ORAL

## 2018-07-28 MED ORDER — DIPHENOXYLATE-ATROPINE 2.5-0.025 MG PO TABS
1.0000 | ORAL_TABLET | Freq: Three times a day (TID) | ORAL | Status: DC
  Administered 2018-07-28 – 2018-07-29 (×4): 1 via ORAL
  Filled 2018-07-28 (×5): qty 1

## 2018-07-28 MED ORDER — PROMETHAZINE HCL 25 MG/ML IJ SOLN
25.0000 mg | Freq: Four times a day (QID) | INTRAMUSCULAR | Status: DC | PRN
  Administered 2018-07-28 – 2018-07-31 (×8): 25 mg via INTRAVENOUS
  Filled 2018-07-28 (×7): qty 1

## 2018-07-28 NOTE — Consult Note (Signed)
[facility] GI Inpatient Consult Note   Joanna Hall, M.D.  Reason for Consult: Acute colitis   Attending Requesting Consult: Demetrios Loll, MD  Outpatient Primary Physician: Enid Derry, MD  History of Present Illness: Joanna Hall is a 52 y.o. 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  Patient recently underwent chemotherapy for her cervical cancer causing significant diarrhea symptoms and colitis.  Patient remarks lower abdominal cramping and recurrent loose stools without blood.  There is been intermittent episodes of vomiting.  Patient was placed empirically on ciprofloxacin and Flagyl by the admitting team.  It appears to be oncology (Dr. Mike Gip) opinion that the colitis is secondary to chemotherapy.  Patient is followed by Dr. Sherri Sear of [facility] for chronic diarrhea that is considered noninfectious.  Patient has been treated symptomatically with Questran and Imodium with little benefit recently over the past 5 days.  She has a history of elevated liver associated enzymes which were elevated but not improving in the outpatient setting under Dr. Verlin Grills serial evaluations.CT scan of the abdomen and pelvis with contrast obtained on admission revealed diffuse colonic thickening with worse thickening in the transverse colon compared to previous exams.  No acute findings such as abscess or perforation were noted.  Persistent stable small nodules were noted at both lung bases. Patient's labs revealed anemia with hemoglobin 8.8 which is significantly less than previously (11.0), however the patient had a dose of chemotherapy as recently as 5 days ago.  The anemia is macrocytic.  Liver enzymes are minimally elevated with ALT of 58.  AST, bilirubin are normal.  Alkaline phosphatase minimally elevated at 153.  Magnesium was low at  1.5.     Past Medical History:  Past Medical History:  Diagnosis Date  . Abnormal cervical Papanicolaou smear 09/18/2017  . Anxiety   . Aortic atherosclerosis ([place])   . Arthritis    neck and knees  . 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 Blood transfusion without reported diagnosis   . Cervical cancer (HCC)    mets lung  . Chronic anal fissure   . Chronic diarrhea   . Dyspnea   . Factor V Leiden mutation ([place])   . Fecal incontinence   . Genital warts   . GERD (gastroesophageal reflux disease)   . Heart murmur   . Hemorrhoids   . 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 History of cancer chemotherapy    completed 06/2017  . History of Clostridium difficile infection    while undergoing chemo.  Negative test 10/2017  . Infarction of kidney ([place]) left kidney   and uterus  . Intestinal infection due to Clostridium difficile 09/18/2017  . Macrocytic anemia with vitamin B12 deficiency   . Perianal condylomata   . Pneumonia    History of  . Pulmonary nodules   . Rectal bleeding   . Small bowel obstruction ([place]) 08/2017  . Stiff neck    limited right turn  . Vitamin D deficiency     Problem List: Patient Active Problem List   Diagnosis Date Noted  . Pancolitis ([place]) 07/27/2018  . Chronic diarrhea 06/25/2018  . Chronic anticoagulation 06/25/2018  . Vomiting   . Abdominal pain 06/10/2018  . Encounter for antineoplastic chemotherapy 06/04/2018  . 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  Lung metastasis ([place]) 05/15/2018  . Closed fracture of distal end of radius 05/04/2018  . History of total knee arthroplasty 04/24/2018  . Osteoarthritis of left knee 02/28/2018  . Condyloma acuminatum of anus s/p ablation 02/22/2018 02/22/2018  . Condyloma acuminatum of vagina s/p ablation 02/22/2018 02/22/2018  . Positive ANA (antinuclear antibody) 02/04/2018  . Aortic atherosclerosis ([place]) 01/15/2018  . Elevated MCV 01/15/2018  . Anemia  01/15/2018  . Genital warts 01/15/2018  . Vitamin D deficiency 01/15/2018  . Pernicious anemia   . Impingement syndrome of shoulder region 12/19/2017  . Multiple joint pain 12/19/2017  . Osteoarthritis of right knee 12/19/2017  . B12 deficiency 12/11/2017  . Lung nodules 12/11/2017  . History of Clostridium difficile infection 10/23/2017  . Factor V Leiden ([place]) 10/19/2017  . Goals of care, counseling/discussion 10/19/2017  . Cervical cancer, FIGO stage IB1 ([place]) 09/18/2017  . Chronic hepatitis C without hepatic coma ([place]) 09/18/2017  . Cytopenia 09/18/2017  . Diarrhea 09/18/2017  . Erosive gastropathy 09/18/2017  . Lower abdominal pain 09/18/2017  . 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 Malignant neoplasm of overlapping sites of cervix ([place]) 09/18/2017  . Renal insufficiency 09/18/2017  . Wound infection after surgery 09/12/2017  . Hypokalemia   . Hypomagnesemia   . Ileocolic anastomotic leak   . Cervical arthritis 07/18/2017  . Dysuria 06/20/2017  . Metastatic cancer ([place]) 05/12/2017  . Hepatitis, chronic ([place]) 05/05/2017  . Essential hypertension 03/15/2017  . Anemia in other chronic diseases classified elsewhere 03/01/2017  . Chemotherapy-induced neutropenia ([place]) 01/29/2017  . 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[place]) 09/25/2016    Past Surgical History: Past Surgical History:  Procedure Laterality Date  . CHOLECYSTECTOMY    . COLON SURGERY  08/2017   resection  . COLONOSCOPY WITH PROPOFOL N/A 12/20/2017   Procedure: COLONOSCOPY WITH PROPOFOL;  Surgeon: [name], MD;  [facility] ENDOSCOPY;  Service: Gastroenterology;  Laterality: N/A;  . DIAGNOSTIC LAPAROSCOPY    . ESOPHAGOGASTRODUODENOSCOPY (EGD) WITH PROPOFOL N/A 12/20/2017   Procedure: ESOPHAGOGASTRODUODENOSCOPY (EGD) WITH PROPOFOL;  Surgeon: [name], MD;  Location: [facility];  Service: Gastroenterology;  Laterality: N/A;  . LAPAROTOMY N/A 08/31/2017   Procedure: EXPLORATORY LAPAROTOMY for  SBO, ileocolectomy, removal of piece of uterine wall;  Surgeon: [name], MD;  Location: [facility] ORS;  Service: General;  Laterality: N/A;  . LASER ABLATION CONDOLAMATA N/A 02/22/2018   Procedure: LASER ABLATION/REMOVAL OF [id number] AROUND ANUS AND VAGINA;  Surgeon: Michael Boston, MD;  Location: [facility];  Service: General;  Laterality: N/A;  . OOPHORECTOMY    . PORTA CATH INSERTION N/A 05/13/2018   Procedure: PORTA CATH INSERTION;  Surgeon: Katha Cabal, MD;  Location: [facility];  Service: Cardiovascular;  Laterality: N/A;  . SMALL INTESTINE SURGERY    . TANDEM RING INSERTION     x3  . THORACOTOMY    . TOTAL KNEE ARTHROPLASTY Left 04/24/2018   Procedure: TOTAL KNEE ARTHROPLASTY;  Surgeon: Lovell Sheehan, MD;  Location: [facility] ORS;  Service: Orthopedics;  Laterality: Left;    Allergies: Allergies  Allergen Reactions  . Ketamine Anxiety and Other (See Comments)    Syncope episode/confusion     Home Medications: Medications Prior to Admission  Medication Sig Dispense Refill Last Dose  . Calcium Carb-Cholecalciferol (CALCIUM 500 +D) 500-400 MG-UNIT TABS Take 2 tablets by mouth daily.   Taking  . cholestyramine (QUESTRAN) 4 g packet Take 4 g by mouth 3 (three) times daily as needed.     Marland Kitchen  colestipol (COLESTID) 1 g tablet Take 2 tablets (2 g total) by mouth 2 (two) times daily. 60 tablet 0 07/26/2018 at Unknown time  . cyanocobalamin (,VITAMIN B-12,) 1000 MCG/ML injection Inject 1ML every week x 4 weeks, then 1ML every other week x 2 months, then monthly x 3 months (Patient taking differently: Inject 1,000 mcg into the muscle every 30 (thirty) days. Inject 1ML every week x 4 weeks, then 1ML every other week x 2 months, then monthly x 3 months) 10 mL 1 Taking  . dicyclomine (BENTYL) 20 MG tablet Take 1 tablet (20 mg total) by mouth every 6 (six) hours. (Patient taking differently: Take 20 mg by mouth every 6 (six) hours. Taking as needed) 20 tablet 0 Past Week  at Unknown time  . diphenoxylate-atropine (LOMOTIL) 2.5-0.025 MG tablet Take 2 tablets by mouth 4 (four) times daily. 240 tablet 1 Taking  . ondansetron (ZOFRAN) 8 MG tablet Take 1 tablet (8 mg total) by mouth every 8 (eight) hours as needed for nausea or vomiting. 30 tablet 0   . promethazine (PHENERGAN) 25 MG tablet Take 1 tablet (25 mg total) by mouth every 6 (six) hours as needed for nausea or vomiting. 30 tablet 0   . rivaroxaban (XARELTO) 20 MG TABS tablet Take 1 tablet (20 mg total) by mouth daily with supper. 30 tablet 1 Taking  . sertraline (ZOLOFT) 50 MG tablet Take 1 tablet (50 mg total) by mouth daily. 30 tablet 3 Taking  . traMADol (ULTRAM) 50 MG tablet Take 1 tablet (50 mg total) by mouth every 6 (six) hours as needed. 30 tablet 0 Taking  . fluticasone (FLONASE) 50 MCG/ACT nasal spray Place 2 sprays into both nostrils daily. (Patient not taking: Reported on 07/23/2018) 16 g 0 Not Taking at Unknown time  . ibuprofen (ADVIL,MOTRIN) 600 MG tablet Take 1 tablet (600 mg total) by mouth every 8 (eight) hours as needed. (Patient not taking: Reported on 07/23/2018) 15 tablet 0 Not Taking at Unknown time  . loratadine (CLARITIN) 10 MG tablet Take 10 mg by mouth daily.   Not Taking at Unknown time  . Syringe/Needle, Disp, (SYRINGE 3CC/22GX1") 22G X 1" 3 ML MISC For use with Vitamin B12 injections 50 each 0 Taking  . traZODone (DESYREL) 50 MG tablet Take 0.5-1 tablets (25-50 mg total) by mouth at bedtime as needed for sleep. 30 tablet 0   . VENTOLIN HFA 108 (90 Base) MCG/ACT inhaler Inhale 1-2 puffs into the lungs every 4 (four) hours as needed for shortness of breath.   0 Not Taking at Unknown time   Home medication reconciliation was completed with the patient.   Scheduled Inpatient Medications:   . colestipol  2 g Oral BID  . dicyclomine  20 mg Oral Q6H  . loratadine  10 mg Oral Daily  . rivaroxaban  20 mg Oral Q supper  . sertraline  50 mg Oral Daily    Continuous Inpatient Infusions:    . sodium chloride Stopped (07/28/18 0300)  . ciprofloxacin 400 mg (07/28/18 0300)  . metronidazole 500 mg (07/28/18 0450)    PRN Inpatient Medications:  acetaminophen **OR** acetaminophen, albuterol, alum & mag hydroxide-simeth, HYDROcodone-acetaminophen, ondansetron **OR** ondansetron (ZOFRAN) IV, promethazine, traZODone  Family History: family history includes Alcohol abuse in her daughter; Diabetes in her father, maternal grandmother, and paternal grandmother; Hypertension in her father, maternal grandmother, and paternal grandmother.   GI Family History: Negative  Social History:   reports that she quit smoking about 11 years ago.  She has never used smokeless tobacco. She reports that she drinks alcohol. She reports that she has current or past drug history. Drug: Marijuana. The patient denies ETOH, tobacco, or drug use.    Review of Systems: Review of Systems - Negative except History of present illness.  Physical Examination: BP (!) 153/88 (BP Location: Left Arm)   Pulse 66   Temp (!) 97.5 F (36.4 C) (Oral)   Resp 17   Ht 5' 7"  (1.702 m)   Wt 72.6 kg   SpO2 98%   BMI 25.06 kg/m   Physical Exam  Constitutional: She is oriented to person, place, and time. She appears well-developed and well-nourished. No distress.  HENT:  Right Ear: External ear normal.  Left Ear: External ear normal.  Total alopecia present from chemotherapy.  Eyes: Pupils are equal, round, and reactive to light. Conjunctivae are normal.  Neck: Normal range of motion.  Cardiovascular: Normal rate and normal heart sounds.  Pulmonary/Chest: Effort normal and breath sounds normal.  Abdominal: Soft. Bowel sounds are normal. She exhibits no distension. There is no tenderness.  Musculoskeletal: Normal range of motion.  Neurological: She is alert and oriented to person, place, and time.  Skin: Skin is warm. Capillary refill takes less than 2 seconds.  Psychiatric: She has a normal mood and affect. Her  behavior is normal.    Data: Lab Results  Component Value Date   WBC 4.2 07/28/2018   HGB 8.8 (L) 07/28/2018   HCT 26.6 (L) 07/28/2018   MCV 101.9 (H) 07/28/2018   PLT 72 (L) 07/28/2018   Recent Labs  Lab 07/23/18 0916 07/27/18 1042 07/28/18 0617  HGB 10.3* 11.1* 8.8*   Lab Results  Component Value Date   NA 137 07/28/2018   K 3.4 (L) 07/28/2018   CL 106 07/28/2018   CO2 25 07/28/2018   BUN 7 07/28/2018   CREATININE 0.59 07/28/2018   Lab Results  Component Value Date   ALT 58 (H) 07/27/2018   AST 40 07/27/2018   ALKPHOS 153 (H) 07/27/2018   BILITOT 0.9 07/27/2018   No results for input(s): APTT, INR, PTT in the last 168 hours. CBC Latest Ref Rng & Units 07/28/2018 07/27/2018 07/23/2018  WBC 4.0 - 10.5 K/uL 4.2 9.0 2.6(L)  Hemoglobin 12.0 - 15.0 g/dL 8.8(L) 11.1(L) 10.3(L)  Hematocrit 36.0 - 46.0 % 26.6(L) 33.2(L) 29.6(L)  Platelets 150 - 400 K/uL 72(L) 111(L) 140(L)    STUDIES: Ct Abdomen Pelvis W Contrast  Result Date: 07/27/2018 CLINICAL DATA:  Pt with generalized abd/pelvic pain and N/V/D since last night. Pt states cervical ca and is having treatments ^151m ISOVUE-300 IOPAMIDOL (ISOVUE-300) INJECTION 61% EXAM: CT ABDOMEN AND PELVIS WITH CONTRAST TECHNIQUE: Multidetector CT imaging of the abdomen and pelvis was performed using the standard protocol following bolus administration of intravenous contrast. CONTRAST:  1074mISOVUE-300 IOPAMIDOL (ISOVUE-300) INJECTION 61% COMPARISON:  None. FINDINGS: Lower chest: Multiple small nodules again appreciated at the bilateral lung bases, some cavitary, compatible with previously described known metastatic disease. No new findings at either lung base. No pericardial effusion at the heart base. Hepatobiliary: No focal liver abnormality is seen. Status post cholecystectomy. No biliary dilatation. Pancreas: Unremarkable. No pancreatic ductal dilatation or surrounding inflammatory changes. Spleen: Normal in size without focal  abnormality. Adrenals/Urinary Tract: Adrenal glands appear normal. Kidneys appear normal without mass, stone or hydronephrosis. Bladder appears normal. Stomach/Bowel: Status post RIGHT hemicolectomy. There is thickening of the walls of the entire colon, most prominent within the transverse colon, indicating  colitis. No dilated large or small bowel loops. Stomach is unremarkable. Vascular/Lymphatic: Aortic atherosclerosis. No enlarged abdominal or pelvic lymph nodes. Reproductive: No adnexal mass or free fluid. Other: Fluid stranding within the lower pelvis, likely treatment related. No abscess collection seen. No free intraperitoneal air. Musculoskeletal: No acute or suspicious osseous finding. Mild degenerative spondylitic changes throughout the slightly scoliotic thoracolumbar spine. IMPRESSION: 1. 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 2. No acute or significant findings elsewhere within the abdomen or pelvis. No bowel obstruction. No abscess collection. No free intraperitoneal air. No evidence of acute solid organ abnormality. 3. Stable small nodules at the lung bases, some cavitary, compatible with previously described known metastatic disease. 4. Aortic Atherosclerosis (ICD10-I70.0). Electronically Signed   By: Franki Cabot M.D.   On: 07/27/2018 13:42   @IMAGES @  Assessment: 1. Acute on chronic colitis - as mentioned previously, likely from chemotherapy treatment last week; also contributed to by post partial colon resection with ileocolonic anastomosis. Stool qualitative fat, c. Dif, GI panel all negative. Normal fecal calprotectin.  2. Metastatic cervical cancer  3. Pancytopenia  4. Hx of Clostridium Dificile-associated diarrhea- None present in recent  months and not currently.   Recommendations: 1. Continue supportive care of diarrhea. Symptomatic treatment of diarrhea.  2. Strongly consider discontinuing all antibiotics without evidence of infection. Need to avoid recurrent C. Dif infection.  3. Case discussed with attending physician Dr. Leslye Peer.  4. Will see again as needed. Call back if we can help.  Thank you for the consult. Please call with questions or concerns.  [name], "Lanny Hurst MD [facility] [place], [place] [address] [phone]  07/28/2018 8:39 AM

## 2018-07-28 NOTE — Progress Notes (Signed)
Initial Nutrition Assessment  DOCUMENTATION CODES:   Not applicable  INTERVENTION:   Boost Breeze po TID, each supplement provides 250 kcal and 9 grams of protein  MVI daily  Pt likely at refeeding risk; recommend monitor K, Mg and P labs as oral intake improves.   NUTRITION DIAGNOSIS:   Inadequate oral intake related to acute illness as evidenced by patient's report.   GOAL:   Patient will meet greater than or equal to 90% of their needs  MONITOR:   PO intake, Supplement acceptance, Labs, Weight trends, Skin, I & O's  REASON FOR ASSESSMENT:   Malnutrition Screening Tool    ASSESSMENT:   52 y.o. female with a past medical history of anxiety, factor V Leiden, gastric reflux, currently on chemotherapy for cervical cancer with lung mets who presents to the emergency department for nausea, weakness, vomiting, diarrhea and abdominal discomfort. Pt found to have pancolitis    Patient familiar to this RD from multiple previous admits. Pt with recurrent nausea, vomiting and diarrhea was last seen by this RD in August for the same presentation. Pt with poor appetite and oral intake for several days pta. Pt reports 2 to 3 days of generalized abdominal pain, nausea with intermittent episodes of vomiting, and frequent diarrhea. Pt found to have colitis. Pt currently on a clear liquid diet. Per chart, pt has been fairly weight stable pta. Pt reports that she got down to 149lbs at one point but had gained back up to 155lbs and has been staying between 155-160lbs. Pt with h/o ileocecectomy and bile salt diarrhea since 08/2017; patient currently on colestipol. Pt with h/o B12 and vitamin D deficiency; takes 50,000 units of vitamin D weekly and gets monthly B12 injections. RD will order supplements and MVI to help pt meet her estimated needs. Recommend monitor K, Mg and P labs as pt at high risk for refeeding syndrome.     Medications reviewed and include: colestipol, NaCl @100ml /hr,  ciprofloxacin, metronidazole   Labs reviewed: K 3.4(L), Mg 1.5(L) Hgb 8.8(L), Hct 26.6(L), MCV 101.9(H)  NUTRITION - FOCUSED PHYSICAL EXAM:    Most Recent Value  Orbital Region  No depletion  Upper Arm Region  Mild depletion  Thoracic and Lumbar Region  Mild depletion  Buccal Region  Mild depletion  Temple Region  No depletion  Clavicle Bone Region  Mild depletion  Clavicle and Acromion Bone Region  Mild depletion  Scapular Bone Region  Mild depletion  Dorsal Hand  No depletion  Patellar Region  Mild depletion  Anterior Thigh Region  Mild depletion  Posterior Calf Region  Mild depletion  Edema (RD Assessment)  None  Hair  Reviewed  Eyes  Reviewed  Mouth  Reviewed  Skin  Reviewed  Nails  Reviewed     Diet Order:   Diet Order            Diet clear liquid Room service appropriate? Yes; Fluid consistency: Thin  Diet effective now             EDUCATION NEEDS:   No education needs have been identified at this time  Skin:  Skin Assessment: Reviewed RN Assessment  Last BM:  10/12- type 7  Height:   Ht Readings from Last 1 Encounters:  07/27/18 5' 7"  (1.702 m)    Weight:   Wt Readings from Last 1 Encounters:  07/27/18 72.6 kg    Ideal Body Weight:  61.36 kg  BMI:  Body mass index is 25.06 kg/m.  Estimated Nutritional  Needs:   Kcal:  1800-2100kcal/day   Protein:  85-94g/day   Fluid:  >1.8L/day   Koleen Distance MS, RD, LDN Pager #- [phone] Office#- [phone] After Hours Pager: [phone]

## 2018-07-28 NOTE — Progress Notes (Signed)
Patient has had N/V all day.  She is unable to swallow her pills.  RN held her oral meds and gave antiemetics.  See MAR.  Patient stated abdomen still hurts.  Phillis Knack, RN

## 2018-07-28 NOTE — Progress Notes (Signed)
Patient ID: Joanna Hall, female   DOB: 1965/12/07, 52 y.o.   MRN: [id number]  [facility] PROGRESS NOTE  Joanna Hall NTZ:[id number] DOB: August 18, 1966 DOA: 07/27/2018 PCP: Arnetha Courser, MD  HPI/Subjective: Patient having abdominal discomfort 6 out of 10 with cramping abdominal pain.  Diarrhea that is been watery.  This is been going on for a long time.  Worsens when she receives chemotherapy.  She did have C. difficile last year but the test came back negative now.  Objective: Vitals:   07/28/18 0902 07/28/18 1306  BP: 109/68 134/83  Pulse: 73 72  Resp:  18  Temp:  98 F (36.7 C)  SpO2: 100% 100%    Filed Weights   07/27/18 1019  Weight: 72.6 kg    ROS: Review of Systems  Constitutional: Negative for chills and fever.  Eyes: Negative for blurred vision.  Respiratory: Negative for cough and shortness of breath.   Cardiovascular: Negative for chest pain.  Gastrointestinal: Positive for abdominal pain and diarrhea. Negative for constipation, nausea and vomiting.  Genitourinary: Negative for dysuria.  Musculoskeletal: Negative for joint pain.  Neurological: Negative for dizziness and headaches.   Exam: Physical Exam  Constitutional: She is oriented to person, place, and time.  HENT:  Nose: No mucosal edema.  Mouth/Throat: No oropharyngeal exudate or posterior oropharyngeal edema.  Eyes: Pupils are equal, round, and reactive to light. Conjunctivae, EOM and lids are normal.  Neck: No JVD present. Carotid bruit is not present. No edema present. No thyroid mass and no thyromegaly present.  Cardiovascular: S1 normal and S2 normal. Exam reveals no gallop.  No murmur heard. Pulses:      Dorsalis pedis pulses are 2+ on the right side, and 2+ on the left side.  Respiratory: No respiratory distress. She has no wheezes. She has no rhonchi. She has no rales.  GI: Soft. Bowel sounds are normal. There is generalized tenderness.  Musculoskeletal:       Right ankle:  She exhibits no swelling.       Left ankle: She exhibits no swelling.  Lymphadenopathy:    She has no cervical adenopathy.  Neurological: She is alert and oriented to person, place, and time. No cranial nerve deficit.  Skin: Skin is warm. No rash noted. Nails show no clubbing.  Psychiatric: She has a normal mood and affect.      Data Reviewed: Basic Metabolic Panel: Recent Labs  Lab 07/23/18 0916 07/27/18 1042 07/28/18 0617  NA 138 137 137  K 3.5 3.9 3.4*  CL 104 103 106  CO2 27 25 25   GLUCOSE 110* 155* 95  BUN 14 11 7   CREATININE 0.74 0.53 0.59  CALCIUM 9.1 9.4 8.4*  MG 1.5* 1.5* 1.5*  PHOS  --   --  3.4   Liver Function Tests: Recent Labs  Lab 07/23/18 0916 07/27/18 1042  AST 31 40  ALT 37 58*  ALKPHOS 144* 153*  BILITOT 0.5 0.9  PROT 7.3 7.7  ALBUMIN 3.8 4.2   Recent Labs  Lab 07/27/18 1042  LIPASE 38   CBC: Recent Labs  Lab 07/23/18 0916 07/27/18 1042 07/28/18 0617  WBC 2.6* 9.0 4.2  NEUTROABS 1.8 6.7  --   HGB 10.3* 11.1* 8.8*  HCT 29.6* 33.2* 26.6*  MCV 99.6 99.4 101.9*  PLT 140* 111* 72*   Cardiac Enzymes: Recent Labs  Lab 07/27/18 1042  TROPONINI <0.03     Recent Results (from the past 240 hour(s))  C difficile quick scan  w PCR reflex     Status: None   Collection Time: 07/28/18  4:56 AM  Result Value Ref Range Status   C Diff antigen NEGATIVE NEGATIVE Final   C Diff toxin NEGATIVE NEGATIVE Final   C Diff interpretation No C. difficile detected.  Final    Comment: Performed at [facility], [address] [place], [place] [address]     Studies: Ct Abdomen Pelvis W Contrast  Result Date: 07/27/2018 CLINICAL DATA:  Pt with generalized abd/pelvic pain and N/V/D since last night. Pt states cervical ca and is having treatments ^155m ISOVUE-300 IOPAMIDOL (ISOVUE-300) INJECTION 61% EXAM: CT ABDOMEN AND PELVIS WITH CONTRAST TECHNIQUE: Multidetector CT imaging of the abdomen and pelvis was performed using the standard  protocol following bolus administration of intravenous contrast. CONTRAST:  1035mISOVUE-300 IOPAMIDOL (ISOVUE-300) INJECTION 61% COMPARISON:  None. FINDINGS: Lower chest: Multiple small nodules again appreciated at the bilateral lung bases, some cavitary, compatible with previously described known metastatic disease. No new findings at either lung base. No pericardial effusion at the heart base. Hepatobiliary: No focal liver abnormality is seen. Status post cholecystectomy. No biliary dilatation. Pancreas: Unremarkable. No pancreatic ductal dilatation or surrounding inflammatory changes. Spleen: Normal in size without focal abnormality. Adrenals/Urinary Tract: Adrenal glands appear normal. Kidneys appear normal without mass, stone or hydronephrosis. Bladder appears normal. Stomach/Bowel: Status post RIGHT hemicolectomy. There is thickening of the walls of the entire colon, most prominent within the transverse colon, indicating colitis. No dilated large or small bowel loops. Stomach is unremarkable. Vascular/Lymphatic: Aortic atherosclerosis. No enlarged abdominal or pelvic lymph nodes. Reproductive: No adnexal mass or free fluid. Other: Fluid stranding within the lower pelvis, likely treatment related. No abscess collection seen. No free intraperitoneal air. Musculoskeletal: No acute or suspicious osseous finding. Mild degenerative spondylitic changes throughout the slightly scoliotic thoracolumbar spine. IMPRESSION: 1. 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 2. No acute or significant findings elsewhere within the abdomen or pelvis. No bowel obstruction. No abscess collection. No free intraperitoneal air. No evidence of acute solid organ abnormality. 3. Stable small  nodules at the lung bases, some cavitary, compatible with previously described known metastatic disease. 4. Aortic Atherosclerosis (ICD10-I70.0). Electronically Signed   By: StFranki Cabot.D.   On: 07/27/2018 13:42    Scheduled Meds: . colestipol  2 g Oral BID  . dicyclomine  20 mg Oral Q6H  . diphenoxylate-atropine  1 tablet Oral TID  . feeding supplement  1 Container Oral TID BM  . loratadine  10 mg Oral Daily  . multivitamin with minerals  1 tablet Oral Daily  . rivaroxaban  20 mg Oral Q supper  . sertraline  50 mg Oral Daily   Continuous Infusions: . 0.9 % NaCl with KCl 20 mEq / L      Assessment/Plan:  1. Colitis seen on CT scan.  GI recommends discontinuing antibiotics.  Stool for C. difficile negative.  Stool comprehensive panel still pending.  IV fluid hydration and advance diet. 2. Hypomagnesemia replace magnesium IV 3. Hypokalemia replace potassium and IV fluids 4. 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  Follows with oncology.  With recent chemotherapy.  Continue to watch blood counts. 6. Pancytopenia.  Continue to watch counts.  Code Status:     Code Status Orders  (From admission, onward)         Start     Ordered   07/27/18 1622  Full code  Continuous     07/27/18 1621        Code Status History    Date Active Date Inactive Code Status Order ID Comments User Context   06/10/2018 1754 06/12/2018 1614 Full Code [id number]  Dustin Flock, MD Inpatient   04/24/2018 1451 04/27/2018 1700 Full Code [id number]  Lovell Sheehan, MD Inpatient   12/03/2017 1619 12/08/2017 1606 Full Code [id number]  Epifanio Lesches, MD ED   09/12/2017 0024 09/15/2017 1721 Full Code [id number]  [name], MD ED   08/27/2017 0223 09/05/2017 1355 Full Code [id number]  Herbert Pun, MD Inpatient    Advance Directive Documentation     Most Recent Value  Type of Advance Directive  Healthcare Power of [place],  Living will  Pre-existing out of facility DNR order (yellow form or pink MOST form)  -  "MOST" Form in Place?  -     Disposition Plan: To be determined based on how she does  Consultants:  Oncology  Gastroenterology  Time spent: 28 minutes, case discussed with gastroenterology  [facility]

## 2018-07-28 NOTE — Plan of Care (Signed)

## 2018-07-28 NOTE — Consult Note (Signed)
[facility]  Date of admission:  07/27/2018  Inpatient day:  07/28/2018  Consulting physician: Dr. Demetrios Loll   Reason for Consultation:  Lung cancer  Chief Complaint: Joanna Hall is a 52 y.o. female with metastatic cervical cancer who was admitted through the emergency room with colitis.  HPI: The patient was diagnosed with stage IB1 cervical cancer in 2018.  She received concurrent radiation and cisplatin.  She underwent right ileocolectomy on 08/31/2017 for a small bowel obstruction. She has had ongoing issues with diarrhea.  She is followed by Dr. Marius Ditch.  She was diagnosed with metastatic cervical cancer on 05/06/2018.  She has received 3 cycles of carboplatin and Taxol (06/04/2018 - 07/23/2018) with Neulasta support.  At baseline, she has 4-5 soft, never formed, bowel movements.  With chemotherapy, her bowel movements have become more loose.  She has been on a Lomotil regimen.  She felt well until the evening of 07/26/2018.  She then developed nausea, vomiting, and diarrhea.  Her abdomen has become tender.  She denies any fevers, but notes chills.  Bowel movements have been watery.   Past Medical History:  Diagnosis Date  . Abnormal cervical Papanicolaou smear 09/18/2017  . Anxiety   . Aortic atherosclerosis ([place])   . Arthritis    neck and knees  . 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 Blood transfusion without reported diagnosis   . Cervical cancer (HCC)    mets lung  . Chronic anal fissure   . Chronic diarrhea   . Dyspnea   . Factor V Leiden mutation ([place])   . Fecal incontinence   . Genital warts   . GERD (gastroesophageal reflux disease)   . Heart murmur   . Hemorrhoids   . 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 History of cancer chemotherapy    completed 06/2017  . History of Clostridium difficile infection    while undergoing chemo.  Negative test 10/2017  . 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 Intestinal infection due to Clostridium difficile 09/18/2017  . Macrocytic anemia with vitamin B12 deficiency   . Perianal condylomata   . Pneumonia    History of  . Pulmonary nodules   . Rectal bleeding   . Small bowel obstruction ([place]) 08/2017  . Stiff neck    limited right turn  . Vitamin D deficiency     Past Surgical History:  Procedure Laterality Date  . CHOLECYSTECTOMY    . COLON SURGERY  08/2017   resection  . COLONOSCOPY WITH PROPOFOL N/A 12/20/2017   Procedure: COLONOSCOPY WITH PROPOFOL;  Surgeon: [name], MD;  Location: [facility] ENDOSCOPY;  Service: Gastroenterology;  Laterality: N/A;  . DIAGNOSTIC LAPAROSCOPY    . ESOPHAGOGASTRODUODENOSCOPY (EGD) WITH PROPOFOL N/A 12/20/2017   Procedure: ESOPHAGOGASTRODUODENOSCOPY (EGD) WITH PROPOFOL;  Surgeon: [name], MD;  Location: [place];  Service: Gastroenterology;  Laterality: N/A;  . LAPAROTOMY N/A 08/31/2017   Procedure: EXPLORATORY LAPAROTOMY for SBO, ileocolectomy, removal of piece of uterine wall;  Surgeon: [name], MD;  Location: [facility] ORS;  Service: General;  Laterality: N/A;  . LASER ABLATION CONDOLAMATA N/A 02/22/2018   Procedure: LASER ABLATION/REMOVAL OF [id number] AROUND ANUS AND VAGINA;  Surgeon: Michael Boston, MD;  Location: [place];  Service: General;  Laterality: N/A;  . OOPHORECTOMY    . PORTA CATH INSERTION N/A 05/13/2018  Procedure: PORTA CATH INSERTION;  Surgeon: Katha Cabal, MD;  Location: [place] [facility];  Service: Cardiovascular;  Laterality: N/A;  . SMALL INTESTINE SURGERY    . TANDEM RING INSERTION     x3  . THORACOTOMY    . TOTAL KNEE ARTHROPLASTY Left 04/24/2018   Procedure: TOTAL KNEE ARTHROPLASTY;  Surgeon: Lovell Sheehan, MD;  Location: [facility] ORS;  Service: Orthopedics;  Laterality: Left;    Family History  Problem Relation Age of Onset  . Hypertension Father   . Diabetes Father   . Alcohol abuse Daughter   .  Hypertension Maternal Grandmother   . Diabetes Maternal Grandmother   . Diabetes Paternal Grandmother   . Hypertension Paternal Grandmother     Social History:  reports that she quit smoking about 11 years ago. She has never used smokeless tobacco. She reports that she drinks alcohol. She reports that she has current or past drug history. Drug: Marijuana.  She is alone today.  Allergies:  Allergies  Allergen Reactions  . Ketamine Anxiety and Other (See Comments)    Syncope episode/confusion     Medications Prior to Admission  Medication Sig Dispense Refill  . Calcium Carb-Cholecalciferol (CALCIUM 500 +D) 500-400 MG-UNIT TABS Take 2 tablets by mouth daily.    . cholestyramine (QUESTRAN) 4 g packet Take 4 g by mouth 3 (three) times daily as needed.    . colestipol (COLESTID) 1 g tablet Take 2 tablets (2 g total) by mouth 2 (two) times daily. 60 tablet 0  . cyanocobalamin (,VITAMIN B-12,) 1000 MCG/ML injection Inject 1ML every week x 4 weeks, then 1ML every other week x 2 months, then monthly x 3 months (Patient taking differently: Inject 1,000 mcg into the muscle every 30 (thirty) days. Inject 1ML every week x 4 weeks, then 1ML every other week x 2 months, then monthly x 3 months) 10 mL 1  . dicyclomine (BENTYL) 20 MG tablet Take 1 tablet (20 mg total) by mouth every 6 (six) hours. (Patient taking differently: Take 20 mg by mouth every 6 (six) hours. Taking as needed) 20 tablet 0  . diphenoxylate-atropine (LOMOTIL) 2.5-0.025 MG tablet Take 2 tablets by mouth 4 (four) times daily. 240 tablet 1  . ondansetron (ZOFRAN) 8 MG tablet Take 1 tablet (8 mg total) by mouth every 8 (eight) hours as needed for nausea or vomiting. 30 tablet 0  . promethazine (PHENERGAN) 25 MG tablet Take 1 tablet (25 mg total) by mouth every 6 (six) hours as needed for nausea or vomiting. 30 tablet 0  . rivaroxaban (XARELTO) 20 MG TABS tablet Take 1 tablet (20 mg total) by mouth daily with supper. 30 tablet 1  .  sertraline (ZOLOFT) 50 MG tablet Take 1 tablet (50 mg total) by mouth daily. 30 tablet 3  . traMADol (ULTRAM) 50 MG tablet Take 1 tablet (50 mg total) by mouth every 6 (six) hours as needed. 30 tablet 0  . fluticasone (FLONASE) 50 MCG/ACT nasal spray Place 2 sprays into both nostrils daily. (Patient not taking: Reported on 07/23/2018) 16 g 0  . ibuprofen (ADVIL,MOTRIN) 600 MG tablet Take 1 tablet (600 mg total) by mouth every 8 (eight) hours as needed. (Patient not taking: Reported on 07/23/2018) 15 tablet 0  . loratadine (CLARITIN) 10 MG tablet Take 10 mg by mouth daily.    . Syringe/Needle, Disp, (SYRINGE 3CC/22GX1") 22G X 1" 3 ML MISC For use with Vitamin B12 injections 50 each 0  . traZODone (DESYREL) 50 MG  tablet Take 0.5-1 tablets (25-50 mg total) by mouth at bedtime as needed for sleep. 30 tablet 0  . VENTOLIN HFA 108 (90 Base) MCG/ACT inhaler Inhale 1-2 puffs into the lungs every 4 (four) hours as needed for shortness of breath.   0    Review of Systems: GENERAL:  Feels "a little better:.  No fevers.  Chills. PERFORMANCE STATUS (ECOG):  1-2 HEENT:  No visual changes, runny nose, sore throat, mouth sores or tenderness. Lungs: Shortness of breath on exertion.  No cough.  No hemoptysis. Cardiac:  No chest pain, palpitations, orthopnea, or PND. GI:  Diffuse abdominal pain.  Nausea, improved.  Diarrhea (liquid).  No constipation or melena.  Isolated hematochezia yesterday. GU:  No urgency, frequency, dysuria, or hematuria. Musculoskeletal:  No back pain.  S/p total knee replacement.  No muscle tenderness. Extremities:  No pain or swelling. Skin:  No rashes or skin changes. Neuro:  No headache, numbness or weakness, balance or coordination issues. Endocrine:  No diabetes, thyroid issues, hot flashes or night sweats. Psych:  No mood changes, depression or anxiety. Pain:  Abdominal pain. Review of systems:  All other systems reviewed and found to be negative.  Physical Exam:  Blood pressure  134/83, pulse 72, temperature 98 F (36.7 C), temperature source Oral, resp. rate 18, height _0  (1.702 m), weight 160 lb (72.6 kg), SpO2 100 %.  GENERAL:  Fatigued appearing woman sitting comfortably on the medical unit in no acute distress. MENTAL STATUS:  Alert and oriented to person, place and time. HEAD:  Near alopecia.  Normocephalic, atraumatic, face symmetric, no Cushingoid features. EYES:  Blue eyes.  Pupils equal round and reactive to light and accomodation.  No conjunctivitis or scleral icterus. ENT:  Oropharynx clear without lesion.  Tongue normal. Mucous membranes moist.  RESPIRATORY:  Clear to auscultation without rales, wheezes or rhonchi. CARDIOVASCULAR:  Regular rate and rhythm without murmur, rub or gallop. ABDOMEN:  Soft, diffusely tender without guarding or rebound tenderness.  Active bowel sounds, and no hepatosplenomegaly.  No masses. SKIN:  No rashes, ulcers or lesions. EXTREMITIES: No edema, no skin discoloration or tenderness.  No palpable cords. LYMPH NODES: No palpable cervical, supraclavicular, axillary or inguinal adenopathy  NEUROLOGICAL: Unremarkable. PSYCH:  Appropriate.   Results for orders placed or performed during the hospital encounter of 07/27/18 (from the past 48 hour(s))  CBC with Differential     Status: Abnormal   Collection Time: 07/27/18 10:42 AM  Result Value Ref Range   WBC 9.0 4.0 - 10.5 K/uL   RBC 3.34 (L) 3.87 - 5.11 MIL/uL   Hemoglobin 11.1 (L) 12.0 - 15.0 g/dL   HCT 33.2 (L) 36.0 - 46.0 %   MCV 99.4 80.0 - 100.0 fL   MCH 33.2 26.0 - 34.0 pg   MCHC 33.4 30.0 - 36.0 g/dL   RDW 15.9 (H) 11.5 - 15.5 %   Platelets 111 (L) 150 - 400 K/uL    Comment: Immature Platelet Fraction may be clinically indicated, consider ordering this additional test [id number]    nRBC 0.0 0.0 - 0.2 %   Neutrophils Relative % 74 %   Neutro Abs 6.7 1.7 - 7.7 K/uL   Lymphocytes Relative 4 %   Lymphs Abs 0.3 (L) 0.7 - 4.0 K/uL   Monocytes Relative 1 %    Monocytes Absolute 0.1 0.1 - 1.0 K/uL   Eosinophils Relative 0 %   Eosinophils Absolute 0.0 0.0 - 0.5 K/uL   Basophils Relative 1 %  Basophils Absolute 0.1 0.0 - 0.1 K/uL   WBC Morphology PELGEROID CHANGES    RBC Morphology MORPHOLOGY UNREMARKABLE    Smear Review Normal platelet morphology    Immature Granulocytes 20 %   Abs Immature Granulocytes 1.83 (H) 0.00 - 0.07 K/uL    Comment: Performed at [facility], [address] [place], [place] [address]  Comprehensive metabolic panel     Status: Abnormal   Collection Time: 07/27/18 10:42 AM  Result Value Ref Range   Sodium 137 135 - 145 mmol/L   Potassium 3.9 3.5 - 5.1 mmol/L   Chloride 103 98 - 111 mmol/L   CO2 25 22 - 32 mmol/L   Glucose, Bld 155 (H) 70 - 99 mg/dL   BUN 11 6 - 20 mg/dL   Creatinine, Ser 0.53 0.44 - 1.00 mg/dL   Calcium 9.4 8.9 - 10.3 mg/dL   Total Protein 7.7 6.5 - 8.1 g/dL   Albumin 4.2 3.5 - 5.0 g/dL   AST 40 15 - 41 U/L   ALT 58 (H) 0 - 44 U/L   Alkaline Phosphatase 153 (H) 38 - 126 U/L   Total Bilirubin 0.9 0.3 - 1.2 mg/dL   GFR calc non Af Amer >60 >60 mL/min   GFR calc Af Amer >60 >60 mL/min    Comment: (NOTE) The eGFR has been calculated using the CKD EPI equation. This calculation has not been validated in all clinical situations. eGFR's persistently <60 mL/min signify possible Chronic Kidney Disease.    Anion gap 9 5 - 15    Comment: Performed at [facility], [address] [place], [place] [address]  Lipase, blood     Status: None   Collection Time: 07/27/18 10:42 AM  Result Value Ref Range   Lipase 38 11 - 51 U/L    Comment: Performed at [facility], [address] [place], [place] [address]  Lactic acid, plasma     Status: None   Collection Time: 07/27/18 10:42 AM  Result Value Ref Range   Lactic Acid, Venous 1.2 0.5 - 1.9 mmol/L    Comment: Performed at [facility], [address] [place], [place] [address]  Troponin I     Status: None    Collection Time: 07/27/18 10:42 AM  Result Value Ref Range   Troponin I <0.03 <0.03 ng/mL    Comment: Performed at [facility], [address] [place], [place] [address]  Magnesium     Status: Abnormal   Collection Time: 07/27/18 10:42 AM  Result Value Ref Range   Magnesium 1.5 (L) 1.7 - 2.4 mg/dL    Comment: Performed at [facility], [address] [place], [place] [address]  Urinalysis, Routine w reflex microscopic     Status: Abnormal   Collection Time: 07/27/18  2:35 PM  Result Value Ref Range   Color, Urine YELLOW (A) YELLOW   APPearance CLEAR (A) CLEAR   Specific Gravity, Urine >1.046 (H) 1.005 - 1.030   pH 7.0 5.0 - 8.0   Glucose, UA NEGATIVE NEGATIVE mg/dL   Hgb urine dipstick NEGATIVE NEGATIVE   Bilirubin Urine NEGATIVE NEGATIVE   Ketones, ur 5 (A) NEGATIVE mg/dL   Protein, ur NEGATIVE NEGATIVE mg/dL   Nitrite NEGATIVE NEGATIVE   Leukocytes, UA TRACE (A) NEGATIVE   RBC / HPF 0-5 0 - 5 RBC/hpf   WBC, UA 0-5 0 - 5 WBC/hpf   Bacteria, UA NONE SEEN NONE SEEN   Squamous Epithelial / LPF  NONE SEEN 0 - 5    Comment: Performed at [facility], [address] [place], [place] [address]  Lactic acid, plasma     Status: None   Collection Time: 07/27/18 10:59 PM  Result Value Ref Range   Lactic Acid, Venous 1.0 0.5 - 1.9 mmol/L    Comment: Performed at [facility], [address] [place], [place] [address]  C difficile quick scan w PCR reflex     Status: None   Collection Time: 07/28/18  4:56 AM  Result Value Ref Range   C Diff antigen NEGATIVE NEGATIVE   C Diff toxin NEGATIVE NEGATIVE   C Diff interpretation No C. difficile detected.     Comment: Performed at [facility], [address] [place], [place] [address]  Basic metabolic panel     Status: Abnormal   Collection Time: 07/28/18  6:17 AM  Result Value Ref Range   Sodium 137 135 - 145 mmol/L   Potassium 3.4 (L) 3.5 - 5.1 mmol/L   Chloride 106 98 - 111 mmol/L    CO2 25 22 - 32 mmol/L   Glucose, Bld 95 70 - 99 mg/dL   BUN 7 6 - 20 mg/dL   Creatinine, Ser 0.59 0.44 - 1.00 mg/dL   Calcium 8.4 (L) 8.9 - 10.3 mg/dL   GFR calc non Af Amer >60 >60 mL/min   GFR calc Af Amer >60 >60 mL/min    Comment: (NOTE) The eGFR has been calculated using the CKD EPI equation. This calculation has not been validated in all clinical situations. eGFR's persistently <60 mL/min signify possible Chronic Kidney Disease.    Anion gap 6 5 - 15    Comment: Performed at [facility], [address] [place], [place] [address]  CBC     Status: Abnormal   Collection Time: 07/28/18  6:17 AM  Result Value Ref Range   WBC 4.2 4.0 - 10.5 K/uL   RBC 2.61 (L) 3.87 - 5.11 MIL/uL   Hemoglobin 8.8 (L) 12.0 - 15.0 g/dL   HCT 26.6 (L) 36.0 - 46.0 %   MCV 101.9 (H) 80.0 - 100.0 fL   MCH 33.7 26.0 - 34.0 pg   MCHC 33.1 30.0 - 36.0 g/dL   RDW 15.9 (H) 11.5 - 15.5 %   Platelets 72 (L) 150 - 400 K/uL    Comment: Immature Platelet Fraction may be clinically indicated, consider ordering this additional test [id number]    nRBC 0.0 0.0 - 0.2 %    Comment: Performed at [facility], [address] [place], [place] [address]  Magnesium     Status: Abnormal   Collection Time: 07/28/18  6:17 AM  Result Value Ref Range   Magnesium 1.5 (L) 1.7 - 2.4 mg/dL    Comment: Performed at [facility], [address] [place], [place] [address]  Phosphorus     Status: None   Collection Time: 07/28/18  6:17 AM  Result Value Ref Range   Phosphorus 3.4 2.5 - 4.6 mg/dL    Comment: Performed at [facility], [address] [place], [place] [address]   Ct Abdomen Pelvis W Contrast  Result Date: 07/27/2018 CLINICAL DATA:  Pt with generalized abd/pelvic pain and N/V/D since last night. Pt states cervical ca and is having treatments ^14m ISOVUE-300 IOPAMIDOL (ISOVUE-300) INJECTION 61% EXAM: CT ABDOMEN AND PELVIS WITH CONTRAST TECHNIQUE: Multidetector CT imaging  of the abdomen and pelvis was performed using the standard protocol following  bolus administration of intravenous contrast. CONTRAST:  142m ISOVUE-300 IOPAMIDOL (ISOVUE-300) INJECTION 61% COMPARISON:  None. FINDINGS: Lower chest: Multiple small nodules again appreciated at the bilateral lung bases, some cavitary, compatible with previously described known metastatic disease. No new findings at either lung base. No pericardial effusion at the heart base. Hepatobiliary: No focal liver abnormality is seen. Status post cholecystectomy. No biliary dilatation. Pancreas: Unremarkable. No pancreatic ductal dilatation or surrounding inflammatory changes. Spleen: Normal in size without focal abnormality. Adrenals/Urinary Tract: Adrenal glands appear normal. Kidneys appear normal without mass, stone or hydronephrosis. Bladder appears normal. Stomach/Bowel: Status post RIGHT hemicolectomy. There is thickening of the walls of the entire colon, most prominent within the transverse colon, indicating colitis. No dilated large or small bowel loops. Stomach is unremarkable. Vascular/Lymphatic: Aortic atherosclerosis. No enlarged abdominal or pelvic lymph nodes. Reproductive: No adnexal mass or free fluid. Other: Fluid stranding within the lower pelvis, likely treatment related. No abscess collection seen. No free intraperitoneal air. Musculoskeletal: No acute or suspicious osseous finding. Mild degenerative spondylitic changes throughout the slightly scoliotic thoracolumbar spine. IMPRESSION: 1. 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 2. No acute or significant findings elsewhere within the abdomen or pelvis. No bowel obstruction. No abscess collection. No free intraperitoneal air. No  evidence of acute solid organ abnormality. 3. Stable small nodules at the lung bases, some cavitary, compatible with previously described known metastatic disease. 4. Aortic Atherosclerosis (ICD10-I70.0). Electronically Signed   By: SFranki CabotM.D.   On: 07/27/2018 13:42    Assessment:  The patient is a 52y.o. woman with stage IV cervical cancer currently day 6 s/p cycle #3 carboplatin and Taxol.  She presented with nausea, vomiting, diarrhea, and abdominal pain.    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  Symptomatically, she is feeling better.  Exam reveals a tender abdomen.  Plan:   1.  Oncology:  Day 6 s/p cycle #3 carboplatin and Taxol with Neulasta support.  At baseline, she has loose stools.  She has had pelvic radiation in the distant past.  Taxol induced enterocolitis < 1% and ischemic colitis < 1%.  Nausea and vomiting 52%; diarrhea 38%.  Carboplatin induced vomiting 65-80%, abdominal pain 17%, diarrhea 6%.  Will discuss with gastroenterology. 2.  Hematology:  Patient received Neulasta with chemotherapy.  Daily CBC with diff. 3.  Gastroenterology:  Colitis.  Etiology unclear.  Stool for C diff negative.  GI panel by PCR pending.  Patient empirically treated with ciprofloxacin and Flagyl.  Patient receiving Lomotil and Bentyl.  Ondansetron for nausea. 4.  Fluids, electrolytes and nutrition:  IVF and electrolyte supplementation.  Daily BMP and Mg. 5.  Pan and toxicology:  Norco 5/325 1-2 tablets q 4 hours prn.   Thank you for allowing me to participate in KMARIDEE SLAPE's care.  I will follow her closely with you while hospitalized and after discharge in the outpatient  department.   MLequita Asal MD  07/28/2018, 2:26 PM

## 2018-07-29 LAB — BASIC METABOLIC PANEL
Anion gap: 7 (ref 5–15)
BUN: 6 mg/dL (ref 6–20)
CHLORIDE: 106 mmol/L (ref 98–111)
CO2: 25 mmol/L (ref 22–32)
Calcium: 8.5 mg/dL — ABNORMAL LOW (ref 8.9–10.3)
Creatinine, Ser: 0.58 mg/dL (ref 0.44–1.00)
GFR calc Af Amer: >60 mL/min (ref >60)
GLUCOSE: 94 mg/dL (ref 70–99)
Potassium: 4.1 mmol/L (ref 3.5–5.1)
Sodium: 138 mmol/L (ref 135–145)

## 2018-07-29 LAB — CBC WITH DIFFERENTIAL/PLATELET
Abs Immature Granulocytes: 0.09 10*3/uL — ABNORMAL HIGH (ref 0.00–0.07)
Basophils Absolute: 0 10*3/uL (ref 0.0–0.1)
Basophils Relative: 1 %
Eosinophils Absolute: 0 10*3/uL (ref 0.0–0.5)
Eosinophils Relative: 1 %
HCT: 27.1 % — ABNORMAL LOW (ref 36.0–46.0)
Hemoglobin: 9.1 g/dL — ABNORMAL LOW (ref 12.0–15.0)
Immature Granulocytes: 3 %
Lymphocytes Relative: 18 %
Lymphs Abs: 0.5 10*3/uL — ABNORMAL LOW (ref 0.7–4.0)
MCH: 33.6 pg (ref 26.0–34.0)
MCHC: 33.6 g/dL (ref 30.0–36.0)
MCV: 100 fL (ref 80.0–100.0)
Monocytes Absolute: 0.1 10*3/uL (ref 0.1–1.0)
Monocytes Relative: 5 %
Neutro Abs: 2.2 10*3/uL (ref 1.7–7.7)
Neutrophils Relative %: 72 %
Platelets: 69 10*3/uL — ABNORMAL LOW (ref 150–400)
RBC: 2.71 MIL/uL — ABNORMAL LOW (ref 3.87–5.11)
RDW: 15.5 % (ref 11.5–15.5)
WBC: 3 10*3/uL — ABNORMAL LOW (ref 4.0–10.5)
nRBC: 0 % (ref 0.0–0.2)

## 2018-07-29 LAB — MAGNESIUM: Magnesium: 1.4 mg/dL — ABNORMAL LOW (ref 1.7–2.4)

## 2018-07-29 MED ORDER — MAGNESIUM SULFATE 2 GM/50ML IV SOLN
2.0000 g | Freq: Once | INTRAVENOUS | Status: AC
  Administered 2018-07-29: 2 g via INTRAVENOUS
  Filled 2018-07-29: qty 50

## 2018-07-29 MED ORDER — HYDROMORPHONE HCL 1 MG/ML IJ SOLN
1.0000 mg | Freq: Four times a day (QID) | INTRAMUSCULAR | Status: DC | PRN
  Administered 2018-07-29 – 2018-07-30 (×5): 1 mg via INTRAVENOUS
  Filled 2018-07-29 (×5): qty 1

## 2018-07-29 MED ORDER — POLYETHYLENE GLYCOL 3350 17 GM/SCOOP PO POWD
1.0000 | Freq: Once | ORAL | Status: DC
  Filled 2018-07-29: qty 255

## 2018-07-29 NOTE — Progress Notes (Signed)
[facility] Gastroenterology Inpatient Progress Note  Subjective: Patient seen for f/u colitis. Patient had "bad night" with several loose stools throughout the night. Patient says no bm's, however, since early this AM.  Objective: Vital signs in last 24 hours: Temp:  [97.8 F (36.6 C)-98 F (36.7 C)] 97.8 F (36.6 C) (10/14 1323) Pulse Rate:  [63-91] 70 (10/14 1323) Resp:  [16-20] 18 (10/14 1323) BP: (143-188)/(78-103) 143/78 (10/14 1323) SpO2:  [97 %-99 %] 97 % (10/14 1323) Blood pressure (!) 143/78, pulse 70, temperature 97.8 F (36.6 C), temperature source Oral, resp. rate 18, height 5' 7"  (1.702 m), weight 72.6 kg, SpO2 97 %.    Intake/Output from previous day: 10/13 0701 - 10/14 0700 In: 974.6 [I.V.:974.6] Out: -   Intake/Output this shift: Total I/O In: 360 [P.O.:360] Out: -    General appearance:  Alert, NAD. Resp:  Coarse BS's, no wheezes. Cardio: RR nl S1, S2. GI:  Soft, BS+, minimally tender without rebound or guarding. Extremities:  No edema.   Lab Results: Results for orders placed or performed during the hospital encounter of 07/27/18 (from the past 24 hour(s))  Basic metabolic panel     Status: Abnormal   Collection Time: 07/29/18  5:01 AM  Result Value Ref Range   Sodium 138 135 - 145 mmol/L   Potassium 4.1 3.5 - 5.1 mmol/L   Chloride 106 98 - 111 mmol/L   CO2 25 22 - 32 mmol/L   Glucose, Bld 94 70 - 99 mg/dL   BUN 6 6 - 20 mg/dL   Creatinine, Ser 0.58 0.44 - 1.00 mg/dL   Calcium 8.5 (L) 8.9 - 10.3 mg/dL   GFR calc non Af Amer >60 >60 mL/min   GFR calc Af Amer >60 >60 mL/min   Anion gap 7 5 - 15  CBC with Differential     Status: Abnormal   Collection Time: 07/29/18  5:01 AM  Result Value Ref Range   WBC 3.0 (L) 4.0 - 10.5 K/uL   RBC 2.71 (L) 3.87 - 5.11 MIL/uL   Hemoglobin 9.1 (L) 12.0 - 15.0 g/dL   HCT 27.1 (L) 36.0 - 46.0 %   MCV 100.0 80.0 - 100.0 fL   MCH 33.6 26.0 - 34.0 pg   MCHC 33.6 30.0 - 36.0 g/dL   RDW 15.5 11.5 - 15.5 %   Platelets 69 (L) 150 - 400 K/uL   nRBC 0.0 0.0 - 0.2 %   Neutrophils Relative % 72 %   Neutro Abs 2.2 1.7 - 7.7 K/uL   Lymphocytes Relative 18 %   Lymphs Abs 0.5 (L) 0.7 - 4.0 K/uL   Monocytes Relative 5 %   Monocytes Absolute 0.1 0.1 - 1.0 K/uL   Eosinophils Relative 1 %   Eosinophils Absolute 0.0 0.0 - 0.5 K/uL   Basophils Relative 1 %   Basophils Absolute 0.0 0.0 - 0.1 K/uL   Immature Granulocytes 3 %   Abs Immature Granulocytes 0.09 (H) 0.00 - 0.07 K/uL  Magnesium     Status: Abnormal   Collection Time: 07/29/18  5:01 AM  Result Value Ref Range   Magnesium 1.4 (L) 1.7 - 2.4 mg/dL     Recent Labs    07/27/18 1042 07/28/18 0617 07/29/18 0501  WBC 9.0 4.2 3.0*  HGB 11.1* 8.8* 9.1*  HCT 33.2* 26.6* 27.1*  PLT 111* 72* 69*   BMET Recent Labs    07/27/18 1042 07/28/18 0617 07/29/18 0501  NA 137 137 138  K 3.9  3.4* 4.1  CL 103 106 106  CO2 25 25 25   GLUCOSE 155* 95 94  BUN 11 7 6   CREATININE 0.53 0.59 0.58  CALCIUM 9.4 8.4* 8.5*   LFT Recent Labs    07/27/18 1042  PROT 7.7  ALBUMIN 4.2  AST 40  ALT 58*  ALKPHOS 153*  BILITOT 0.9   PT/INR No results for input(s): LABPROT, INR in the last 72 hours. Hepatitis Panel No results for input(s): HEPBSAG, HCVAB, HEPAIGM, HEPBIGM in the last 72 hours. C-Diff Recent Labs    07/28/18 0456  CDIFFTOX NEGATIVE   No results for input(s): CDIFFPCR in the last 72 hours.   Studies/Results: No results found.  Scheduled Inpatient Medications:   . colestipol  2 g Oral BID  . dicyclomine  20 mg Oral Q6H  . diphenoxylate-atropine  1 tablet Oral TID  . feeding supplement  1 Container Oral TID BM  . loratadine  10 mg Oral Daily  . multivitamin with minerals  1 tablet Oral Daily  . rivaroxaban  20 mg Oral Q supper  . sertraline  50 mg Oral Daily    Continuous Inpatient Infusions:   . 0.9 % NaCl with KCl 20 mEq / L 75 mL/hr at 07/29/18 0447  . magnesium sulfate 1 - 4 g bolus IVPB 2 g (07/29/18 1441)    PRN  Inpatient Medications:  acetaminophen **OR** acetaminophen, albuterol, alum & mag hydroxide-simeth, HYDROcodone-acetaminophen, HYDROmorphone (DILAUDID) injection, ondansetron **OR** ondansetron (ZOFRAN) IV, promethazine, traZODone  Miscellaneous:Stool samples:  Cdif panel, GI panel all NEGATIVE.  Assessment:  1. Acute colitis - no infectious cause identified. Likely related to chemotherapy. Endoluminal evaluation is not likely to be helpful.  Plan:  1. Continue symptomatic therapy. 2. Questran, imodium, etc.  3. Follow up with Dr. Marius Ditch, her regular GI physician. 4. Call back if needed.  Teodoro K. Alice Reichert, M.D. 07/29/2018, 3:40 PM

## 2018-07-29 NOTE — Progress Notes (Signed)
Contacted by Oncology for a colonoscopy. Spoke to the patient who agrees to the procedure. The patient will have her procedure by Dr. Marius Ditch tomorrow.

## 2018-07-29 NOTE — Progress Notes (Signed)
[facility] Hematology/Oncology Progress Note  Date of admission: 07/27/2018  Hospital day:  07/29/2018  Chief Complaint: Joanna Hall is a 52 y.o. female with metastatic cervical cancer who was admitted through the emergency room with colitis.  Subjective: Patient notes a rough night last night with significant diarrhea.  Feeling better today.  No diarrhea.  Social History: The patient is alone today.  Allergies:  Allergies  Allergen Reactions  . Ketamine Anxiety and Other (See Comments)    Syncope episode/confusion     Scheduled Medications: . colestipol  2 g Oral BID  . dicyclomine  20 mg Oral Q6H  . diphenoxylate-atropine  1 tablet Oral TID  . feeding supplement  1 Container Oral TID BM  . loratadine  10 mg Oral Daily  . multivitamin with minerals  1 tablet Oral Daily  . polyethylene glycol powder  1 Container Oral Once  . rivaroxaban  20 mg Oral Q supper  . sertraline  50 mg Oral Daily    Review of Systems: GENERAL:  Feels better today.  No fever.  Chills. PERFORMANCE STATUS:  1-2 HEENT:  No visual changes, runny nose, sore throat, mouth sores or tenderness. Lungs: No shortness of breath at rest.  No cough.  No hemoptysis. Cardiac:  No chest pain, palpitations, orthopnea, or PND. GI:  Less abdominal pain today.  Nausea improved (ate lunch).  Diarrhea last night (none today).  No constipation, melena or hematochezia. GU:  No urgency, frequency, dysuria, or hematuria. Musculoskeletal:  No back pain.  No joint pain.  No muscle tenderness. Extremities:  No pain or swelling. Skin:  No rashes or skin changes. Neuro:  No headache, numbness or weakness, balance or coordination issues. Endocrine:  No diabetes, thyroid issues, hot flashes or night sweats. Psych:  No mood changes, depression or anxiety. Pain:  Abdominal pain, decreased. Review of systems:  All other systems reviewed and found to be negative.  Physical Exam: Blood pressure (!)  143/78, pulse 70, temperature 97.8 F (36.6 C), temperature source Oral, resp. rate 18, height 5' 7"  (1.702 m), weight 160 lb (72.6 kg), SpO2 97 %.  GENERAL:  Fatigued appearing woman lying comfortably on the medical oncology unit in no acute distress. MENTAL STATUS:  Alert and oriented to person, place and time. HEAD:  Near alopcia.  Normocephalic, atraumatic, face symmetric, no Cushingoid features. EYES:  Blue eyes.  Pupils equal round and reactive to light and accomodation.  No conjunctivitis or scleral icterus. ENT:  Oropharynx clear without lesion.  Tongue normal. Mucous membranes moist.  RESPIRATORY:  Clear to auscultation without rales, wheezes or rhonchi. CARDIOVASCULAR:  Regular rate and rhythm without murmur, rub or gallop. ABDOMEN:  Soft, diffusely tender without guarding or rebound tenderness.  Active bowel sounds and no hepatosplenomegaly.  No masses. SKIN:  No rashes, ulcers or lesions. EXTREMITIES: No edema, no skin discoloration or tenderness.  No palpable cords. LYMPH NODES: No palpable cervical, supraclavicular, axillary or inguinal adenopathy  NEUROLOGICAL: Unremarkable. PSYCH:  Appropriate.   Results for orders placed or performed during the hospital encounter of 07/27/18 (from the past 48 hour(s))  Lactic acid, plasma     Status: None   Collection Time: 07/27/18 10:59 PM  Result Value Ref Range   Lactic Acid, Venous 1.0 0.5 - 1.9 mmol/L    Comment: Performed at [facility], [address] [place], [place] [address]  C difficile quick scan w PCR reflex     Status: None   Collection Time:  07/28/18  4:56 AM  Result Value Ref Range   C Diff antigen NEGATIVE NEGATIVE   C Diff toxin NEGATIVE NEGATIVE   C Diff interpretation No C. difficile detected.     Comment: Performed at [facility], [address] [place], [place] [address]  Basic metabolic panel     Status: Abnormal   Collection Time: 07/28/18  6:17 AM  Result Value Ref Range   Sodium 137  135 - 145 mmol/L   Potassium 3.4 (L) 3.5 - 5.1 mmol/L   Chloride 106 98 - 111 mmol/L   CO2 25 22 - 32 mmol/L   Glucose, Bld 95 70 - 99 mg/dL   BUN 7 6 - 20 mg/dL   Creatinine, Ser 0.59 0.44 - 1.00 mg/dL   Calcium 8.4 (L) 8.9 - 10.3 mg/dL   GFR calc non Af Amer >60 >60 mL/min   GFR calc Af Amer >60 >60 mL/min    Comment: (NOTE) The eGFR has been calculated using the CKD EPI equation. This calculation has not been validated in all clinical situations. eGFR's persistently <60 mL/min signify possible Chronic Kidney Disease.    Anion gap 6 5 - 15    Comment: Performed at [facility], [address] [place], [place] [address]  CBC     Status: Abnormal   Collection Time: 07/28/18  6:17 AM  Result Value Ref Range   WBC 4.2 4.0 - 10.5 K/uL   RBC 2.61 (L) 3.87 - 5.11 MIL/uL   Hemoglobin 8.8 (L) 12.0 - 15.0 g/dL   HCT 26.6 (L) 36.0 - 46.0 %   MCV 101.9 (H) 80.0 - 100.0 fL   MCH 33.7 26.0 - 34.0 pg   MCHC 33.1 30.0 - 36.0 g/dL   RDW 15.9 (H) 11.5 - 15.5 %   Platelets 72 (L) 150 - 400 K/uL    Comment: Immature Platelet Fraction may be clinically indicated, consider ordering this additional test [id number]    nRBC 0.0 0.0 - 0.2 %    Comment: Performed at [facility], [address] [place], [place] [address]  Magnesium     Status: Abnormal   Collection Time: 07/28/18  6:17 AM  Result Value Ref Range   Magnesium 1.5 (L) 1.7 - 2.4 mg/dL    Comment: Performed at [facility], [address] [place], [place] [address]  Phosphorus     Status: None   Collection Time: 07/28/18  6:17 AM  Result Value Ref Range   Phosphorus 3.4 2.5 - 4.6 mg/dL    Comment: Performed at [facility], [address] [place],  [address]  Gastrointestinal Panel by PCR , Stool     Status: None   Collection Time: 07/28/18  1:44 PM  Result Value Ref Range   Campylobacter species NOT DETECTED NOT DETECTED   Plesimonas shigelloides NOT DETECTED NOT DETECTED    Salmonella species NOT DETECTED NOT DETECTED   Yersinia enterocolitica NOT DETECTED NOT DETECTED   Vibrio species NOT DETECTED NOT DETECTED   Vibrio cholerae NOT DETECTED NOT DETECTED   Enteroaggregative E coli (EAEC) NOT DETECTED NOT DETECTED   Enteropathogenic E coli (EPEC) NOT DETECTED NOT DETECTED   Enterotoxigenic E coli (ETEC) NOT DETECTED NOT DETECTED   Shiga like toxin producing E coli (STEC) NOT DETECTED NOT DETECTED   Shigella/Enteroinvasive E coli (EIEC) NOT DETECTED NOT DETECTED   Cryptosporidium NOT DETECTED NOT DETECTED   Cyclospora cayetanensis NOT DETECTED NOT DETECTED   Entamoeba histolytica NOT DETECTED  NOT DETECTED   Giardia lamblia NOT DETECTED NOT DETECTED   Adenovirus F40/41 NOT DETECTED NOT DETECTED   Astrovirus NOT DETECTED NOT DETECTED   Norovirus GI/GII NOT DETECTED NOT DETECTED   Rotavirus A NOT DETECTED NOT DETECTED   Sapovirus (I, II, IV, and V) NOT DETECTED NOT DETECTED    Comment: Performed at [facility], [address] [place], [place] [address]  Basic metabolic panel     Status: Abnormal   Collection Time: 07/29/18  5:01 AM  Result Value Ref Range   Sodium 138 135 - 145 mmol/L   Potassium 4.1 3.5 - 5.1 mmol/L   Chloride 106 98 - 111 mmol/L   CO2 25 22 - 32 mmol/L   Glucose, Bld 94 70 - 99 mg/dL   BUN 6 6 - 20 mg/dL   Creatinine, Ser 0.58 0.44 - 1.00 mg/dL   Calcium 8.5 (L) 8.9 - 10.3 mg/dL   GFR calc non Af Amer >60 >60 mL/min   GFR calc Af Amer >60 >60 mL/min    Comment: (NOTE) The eGFR has been calculated using the CKD EPI equation. This calculation has not been validated in all clinical situations. eGFR's persistently <60 mL/min signify possible Chronic Kidney Disease.    Anion gap 7 5 - 15    Comment: Performed at [facility], [address] [place], [place] [address]  CBC with Differential     Status: Abnormal   Collection Time: 07/29/18  5:01 AM  Result Value Ref Range   WBC 3.0 (L) 4.0 - 10.5 K/uL   RBC 2.71  (L) 3.87 - 5.11 MIL/uL   Hemoglobin 9.1 (L) 12.0 - 15.0 g/dL   HCT 27.1 (L) 36.0 - 46.0 %   MCV 100.0 80.0 - 100.0 fL   MCH 33.6 26.0 - 34.0 pg   MCHC 33.6 30.0 - 36.0 g/dL   RDW 15.5 11.5 - 15.5 %   Platelets 69 (L) 150 - 400 K/uL    Comment: Immature Platelet Fraction may be clinically indicated, consider ordering this additional test [id number]    nRBC 0.0 0.0 - 0.2 %   Neutrophils Relative % 72 %   Neutro Abs 2.2 1.7 - 7.7 K/uL   Lymphocytes Relative 18 %   Lymphs Abs 0.5 (L) 0.7 - 4.0 K/uL   Monocytes Relative 5 %   Monocytes Absolute 0.1 0.1 - 1.0 K/uL   Eosinophils Relative 1 %   Eosinophils Absolute 0.0 0.0 - 0.5 K/uL   Basophils Relative 1 %   Basophils Absolute 0.0 0.0 - 0.1 K/uL   Immature Granulocytes 3 %   Abs Immature Granulocytes 0.09 (H) 0.00 - 0.07 K/uL    Comment: Performed at [facility], [address] [place], [place] [address]  Magnesium     Status: Abnormal   Collection Time: 07/29/18  5:01 AM  Result Value Ref Range   Magnesium 1.4 (L) 1.7 - 2.4 mg/dL    Comment: Performed at [facility], [address] [place], [place] [address]   No results found.  Assessment:  Joanna Hall is a 52 y.o. female with stage IV cervical cancer currently day 7 s/p cycle #3 carboplatin and Taxol.  She presented with nausea, vomiting, diarrhea, and abdominal pain.    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  Symptomatically, she had significant diarrhea last night (noen today).  Exam reveals a tender abdomen.  WBC 3000 ([place] 2200).  Platelets 69,000.  Plan:   1.  Oncology:             Day 7 s/p cycle #3 carboplatin and Taxol with Neulasta support.              At baseline, she has loose stools.  She has had pelvic radiation in the distant past.             Taxol induced enterocolitis < 1% and ischemic colitis < 1%.  Nausea and vomiting 52%; diarrhea 38%.             Carboplatin induced vomiting 65-80%, abdominal pain 17%, diarrhea 6%.             Discussed with Dr Marius Ditch. 2.  Hematology:             Patient received Neulasta with chemotherapy.             Patient on Xarelto.  Daily CBC with diff. 3.  Gastroenterology:             Colitis.  Etiology unclear.             Stool for C diff negative.  GI panel by PCR negative.             Empiric ciprofloxacin and Flagyl discontinued.             Patient receiving Lomotil and Bentyl.  Ondansetron for nausea.  Review of colonoscopy on 12/20/2017 was normal.  4.  Fluids, electrolytes and nutrition:             IVF and electrolyte supplementation.  Patient was able to eat lunch today.             Daily BMP and Mg. 5.  Pan and toxicology:             Norco 5/325 1-2 tablets q 4 hours prn.  Dilaudid 1 mg every 6 hours prn.   Lequita Asal, MD  07/29/2018, 5:10 PM

## 2018-07-29 NOTE — Progress Notes (Signed)
Patient ID: Joanna Hall, female   DOB: 05-18-1966, 52 y.o.   MRN: [id number]  [employer] PROGRESS NOTE  Joanna Hall HDQ:[id number] DOB: 1966-07-11 DOA: 07/27/2018 PCP: Arnetha Courser, MD  HPI/Subjective: Patient not feeling well at all.  Having nausea vomiting abdominal pain and watery diarrhea.  Objective: Vitals:   07/28/18 2051 07/29/18 0430  BP: (!) 188/103 (!) 159/78  Pulse: 91 63  Resp: 20 16  Temp: 97.9 F (36.6 C) 98 F (36.7 C)  SpO2: 99% 97%    Filed Weights   07/27/18 1019  Weight: 72.6 kg    ROS: Review of Systems  Constitutional: Negative for chills and fever.  Eyes: Negative for blurred vision.  Respiratory: Negative for cough and shortness of breath.   Cardiovascular: Negative for chest pain.  Gastrointestinal: Positive for abdominal pain, diarrhea, nausea and vomiting. Negative for constipation.  Genitourinary: Negative for dysuria.  Musculoskeletal: Negative for joint pain.  Neurological: Negative for dizziness and headaches.   Exam: Physical Exam  Constitutional: She is oriented to person, place, and time.  HENT:  Nose: No mucosal edema.  Mouth/Throat: No oropharyngeal exudate or posterior oropharyngeal edema.  Eyes: Pupils are equal, round, and reactive to light. Conjunctivae, EOM and lids are normal.  Neck: No JVD present. Carotid bruit is not present. No edema present. No thyroid mass and no thyromegaly present.  Cardiovascular: S1 normal and S2 normal. Exam reveals no gallop.  No murmur heard. Pulses:      Dorsalis pedis pulses are 2+ on the right side, and 2+ on the left side.  Respiratory: No respiratory distress. She has no wheezes. She has no rhonchi. She has no rales.  GI: Soft. Bowel sounds are normal. There is generalized tenderness.  Musculoskeletal:       Right ankle: She exhibits no swelling.       Left ankle: She exhibits no swelling.  Lymphadenopathy:    She has no cervical adenopathy.  Neurological: She is  alert and oriented to person, place, and time. No cranial nerve deficit.  Skin: Skin is warm. No rash noted. Nails show no clubbing.  Psychiatric: She has a normal mood and affect.      Data Reviewed: Basic Metabolic Panel: Recent Labs  Lab 07/23/18 0916 07/27/18 1042 07/28/18 0617 07/29/18 0501  NA 138 137 137 138  K 3.5 3.9 3.4* 4.1  CL 104 103 106 106  CO2 27 25 25 25   GLUCOSE 110* 155* 95 94  BUN 14 11 7 6   CREATININE 0.74 0.53 0.59 0.58  CALCIUM 9.1 9.4 8.4* 8.5*  MG 1.5* 1.5* 1.5* 1.4*  PHOS  --   --  3.4  --    Liver Function Tests: Recent Labs  Lab 07/23/18 0916 07/27/18 1042  AST 31 40  ALT 37 58*  ALKPHOS 144* 153*  BILITOT 0.5 0.9  PROT 7.3 7.7  ALBUMIN 3.8 4.2   Recent Labs  Lab 07/27/18 1042  LIPASE 38   CBC: Recent Labs  Lab 07/23/18 0916 07/27/18 1042 07/28/18 0617 07/29/18 0501  WBC 2.6* 9.0 4.2 3.0*  NEUTROABS 1.8 6.7  --  2.2  HGB 10.3* 11.1* 8.8* 9.1*  HCT 29.6* 33.2* 26.6* 27.1*  MCV 99.6 99.4 101.9* 100.0  PLT 140* 111* 72* 69*   Cardiac Enzymes: Recent Labs  Lab 07/27/18 1042  TROPONINI <0.03     Recent Results (from the past 240 hour(s))  C difficile quick scan w PCR reflex     Status:  None   Collection Time: 07/28/18  4:56 AM  Result Value Ref Range Status   C Diff antigen NEGATIVE NEGATIVE Final   C Diff toxin NEGATIVE NEGATIVE Final   C Diff interpretation No C. difficile detected.  Final    Comment: Performed at [facility], [address] [place], [place] [address]  Gastrointestinal Panel by PCR , Stool     Status: None   Collection Time: 07/28/18  1:44 PM  Result Value Ref Range Status   Campylobacter species NOT DETECTED NOT DETECTED Final   Plesimonas shigelloides NOT DETECTED NOT DETECTED Final   Salmonella species NOT DETECTED NOT DETECTED Final   Yersinia enterocolitica NOT DETECTED NOT DETECTED Final   Vibrio species NOT DETECTED NOT DETECTED Final   Vibrio cholerae NOT DETECTED NOT  DETECTED Final   Enteroaggregative E coli (EAEC) NOT DETECTED NOT DETECTED Final   Enteropathogenic E coli (EPEC) NOT DETECTED NOT DETECTED Final   Enterotoxigenic E coli (ETEC) NOT DETECTED NOT DETECTED Final   Shiga like toxin producing E coli (STEC) NOT DETECTED NOT DETECTED Final   Shigella/Enteroinvasive E coli (EIEC) NOT DETECTED NOT DETECTED Final   Cryptosporidium NOT DETECTED NOT DETECTED Final   Cyclospora cayetanensis NOT DETECTED NOT DETECTED Final   Entamoeba histolytica NOT DETECTED NOT DETECTED Final   Giardia lamblia NOT DETECTED NOT DETECTED Final   Adenovirus F40/41 NOT DETECTED NOT DETECTED Final   Astrovirus NOT DETECTED NOT DETECTED Final   Norovirus GI/GII NOT DETECTED NOT DETECTED Final   Rotavirus A NOT DETECTED NOT DETECTED Final   Sapovirus (I, II, IV, and V) NOT DETECTED NOT DETECTED Final    Comment: Performed at [facility], [address] [place], [name] [address]     Studies: Ct Abdomen Pelvis W Contrast  Result Date: 07/27/2018 CLINICAL DATA:  Pt with generalized abd/pelvic pain and N/V/D since last night. Pt states cervical ca and is having treatments ^134m ISOVUE-300 IOPAMIDOL (ISOVUE-300) INJECTION 61% EXAM: CT ABDOMEN AND PELVIS WITH CONTRAST TECHNIQUE: Multidetector CT imaging of the abdomen and pelvis was performed using the standard protocol following bolus administration of intravenous contrast. CONTRAST:  1072mISOVUE-300 IOPAMIDOL (ISOVUE-300) INJECTION 61% COMPARISON:  None. FINDINGS: Lower chest: Multiple small nodules again appreciated at the bilateral lung bases, some cavitary, compatible with previously described known metastatic disease. No new findings at either lung base. No pericardial effusion at the heart base. Hepatobiliary: No focal liver abnormality is seen. Status post cholecystectomy. No biliary dilatation. Pancreas: Unremarkable. No pancreatic ductal dilatation or surrounding inflammatory changes. Spleen: Normal in size  without focal abnormality. Adrenals/Urinary Tract: Adrenal glands appear normal. Kidneys appear normal without mass, stone or hydronephrosis. Bladder appears normal. Stomach/Bowel: Status post RIGHT hemicolectomy. There is thickening of the walls of the entire colon, most prominent within the transverse colon, indicating colitis. No dilated large or small bowel loops. Stomach is unremarkable. Vascular/Lymphatic: Aortic atherosclerosis. No enlarged abdominal or pelvic lymph nodes. Reproductive: No adnexal mass or free fluid. Other: Fluid stranding within the lower pelvis, likely treatment related. No abscess collection seen. No free intraperitoneal air. Musculoskeletal: No acute or suspicious osseous finding. Mild degenerative spondylitic changes throughout the slightly scoliotic thoracolumbar spine. IMPRESSION: 1. 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 2. No acute or significant findings elsewhere within the abdomen or  pelvis. No bowel obstruction. No abscess collection. No free intraperitoneal air. No evidence of acute solid organ abnormality. 3. Stable small nodules at the lung bases, some cavitary, compatible with previously described known metastatic disease. 4. Aortic Atherosclerosis (ICD10-I70.0). Electronically Signed   By: Franki Cabot M.D.   On: 07/27/2018 13:42    Scheduled Meds: . colestipol  2 g Oral BID  . dicyclomine  20 mg Oral Q6H  . diphenoxylate-atropine  1 tablet Oral TID  . feeding supplement  1 Container Oral TID BM  . loratadine  10 mg Oral Daily  . multivitamin with minerals  1 tablet Oral Daily  . rivaroxaban  20 mg Oral Q supper  . sertraline  50 mg Oral Daily   Continuous Infusions: . 0.9 % NaCl with KCl 20 mEq / L 75 mL/hr at  07/29/18 0447    Assessment/Plan:  1. Colitis seen on CT scan.  GI recommends discontinuing antibiotics.  Stool for C. difficile negative.  Stool comprehensive panel negative.  IV fluid hydration and advance diet. 2. Hypomagnesemia replace magnesium IV again today 3. Hypokalemia replaced in IV fluids 4. 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  Follows with oncology.  With recent chemotherapy.  Continue to watch blood counts. 6. Pancytopenia.  Continue to watch counts.  Code Status:     Code Status Orders  (From admission, onward)         Start     Ordered   07/27/18 1622  Full code  Continuous     07/27/18 1621        Code Status History    Date Active Date Inactive Code Status Order ID Comments User Context   06/10/2018 1754 06/12/2018 1614 Full Code [id number]  Dustin Flock, MD Inpatient   04/24/2018 1451 04/27/2018 1700 Full Code [id number]  Lovell Sheehan, MD Inpatient   12/03/2017 1619 12/08/2017 1606 Full Code [id number]  Epifanio Lesches, MD ED   09/12/2017 0024 09/15/2017 1721 Full Code [id number]  [name], MD ED   08/27/2017 0223 09/05/2017 1355 Full Code [id number]  Herbert Pun, MD Inpatient    Advance Directive Documentation     Most Recent Value  Type of Advance Directive  Healthcare Power of [place], Living will  Pre-existing out of facility DNR order (yellow form or pink MOST form)  -  "MOST" Form in Place?  -     Disposition Plan: To be determined based on how she does  Consultants:  Oncology  Gastroenterology  Time spent: 27 minutes.   Kenyada Dosch [employer]

## 2018-07-30 ENCOUNTER — Inpatient Hospital Stay: Payer: Medicaid Other | Admitting: Anesthesiology

## 2018-07-30 ENCOUNTER — Encounter
Admission: EM | Disposition: A | Discharge status: Home / Self Care | Payer: Self-pay | Source: Home / Self Care | Attending: Internal Medicine

## 2018-07-30 ENCOUNTER — Encounter: Payer: Self-pay | Admitting: Anesthesiology

## 2018-07-30 DIAGNOSIS — K529 Noninfective gastroenteritis and colitis, unspecified: Secondary | ICD-10-CM

## 2018-07-30 DIAGNOSIS — R197 Diarrhea, unspecified: Secondary | ICD-10-CM

## 2018-07-30 DIAGNOSIS — K3189 Other diseases of stomach and duodenum: Secondary | ICD-10-CM

## 2018-07-30 DIAGNOSIS — R933 Abnormal findings on diagnostic imaging of other parts of digestive tract: Secondary | ICD-10-CM

## 2018-07-30 DIAGNOSIS — R112 Nausea with vomiting, unspecified: Secondary | ICD-10-CM

## 2018-07-30 DIAGNOSIS — K210 Gastro-esophageal reflux disease with esophagitis: Secondary | ICD-10-CM

## 2018-07-30 DIAGNOSIS — Z980 Intestinal bypass and anastomosis status: Secondary | ICD-10-CM

## 2018-07-30 HISTORY — PX: ESOPHAGOGASTRODUODENOSCOPY (EGD) WITH PROPOFOL: SHX5813

## 2018-07-30 HISTORY — PX: COLONOSCOPY WITH PROPOFOL: SHX5780

## 2018-07-30 LAB — CBC WITH DIFFERENTIAL/PLATELET
Abs Immature Granulocytes: 0.03 10*3/uL (ref 0.00–0.07)
Basophils Absolute: 0 10*3/uL (ref 0.0–0.1)
Basophils Relative: 0 %
Eosinophils Absolute: 0.1 10*3/uL (ref 0.0–0.5)
Eosinophils Relative: 6 %
HCT: 26.4 % — ABNORMAL LOW (ref 36.0–46.0)
Hemoglobin: 8.8 g/dL — ABNORMAL LOW (ref 12.0–15.0)
Immature Granulocytes: 1 %
Lymphocytes Relative: 30 %
Lymphs Abs: 0.7 10*3/uL (ref 0.7–4.0)
MCH: 33.6 pg (ref 26.0–34.0)
MCHC: 33.3 g/dL (ref 30.0–36.0)
MCV: 100.8 fL — ABNORMAL HIGH (ref 80.0–100.0)
Monocytes Absolute: 0.3 10*3/uL (ref 0.1–1.0)
Monocytes Relative: 12 %
Neutro Abs: 1.2 10*3/uL — ABNORMAL LOW (ref 1.7–7.7)
Neutrophils Relative %: 51 %
Platelets: 66 10*3/uL — ABNORMAL LOW (ref 150–400)
RBC: 2.62 MIL/uL — ABNORMAL LOW (ref 3.87–5.11)
RDW: 15.3 % (ref 11.5–15.5)
WBC: 2.3 10*3/uL — ABNORMAL LOW (ref 4.0–10.5)
nRBC: 0 % (ref 0.0–0.2)

## 2018-07-30 SURGERY — COLONOSCOPY WITH PROPOFOL
Anesthesia: General

## 2018-07-30 MED ORDER — LIDOCAINE HCL (PF) 2 % IJ SOLN
INTRAMUSCULAR | Status: AC
  Filled 2018-07-30: qty 10

## 2018-07-30 MED ORDER — OPIUM 10 MG/ML (1%) PO TINC: 1.0000 mL | Freq: Four times a day (QID) | ORAL | Status: DC | PRN

## 2018-07-30 MED ORDER — GLYCOPYRROLATE 0.2 MG/ML IJ SOLN
INTRAMUSCULAR | Status: AC
  Filled 2018-07-30: qty 1

## 2018-07-30 MED ORDER — HYDROMORPHONE HCL 1 MG/ML IJ SOLN
1.0000 mg | INTRAMUSCULAR | Status: DC | PRN
  Administered 2018-07-31 (×5): 1 mg via INTRAVENOUS
  Filled 2018-07-30 (×5): qty 1

## 2018-07-30 MED ORDER — OPIUM 10 MG/ML (1%) PO TINC
1.0000 mL | Freq: Three times a day (TID) | ORAL | Status: DC
  Administered 2018-07-31 – 2018-08-01 (×2): 10 mg via ORAL
  Filled 2018-07-30 (×3): qty 1

## 2018-07-30 MED ORDER — COLESTIPOL HCL 1 G PO TABS
2.0000 g | ORAL_TABLET | Freq: Three times a day (TID) | ORAL | Status: DC
  Administered 2018-07-30 – 2018-08-01 (×5): 2 g via ORAL
  Filled 2018-07-30 (×7): qty 2

## 2018-07-30 MED ORDER — PANTOPRAZOLE SODIUM 40 MG PO TBEC
40.0000 mg | DELAYED_RELEASE_TABLET | Freq: Two times a day (BID) | ORAL | Status: DC
  Administered 2018-07-30 – 2018-08-01 (×4): 40 mg via ORAL
  Filled 2018-07-30 (×4): qty 1

## 2018-07-30 MED ORDER — GLYCOPYRROLATE 0.2 MG/ML IJ SOLN
INTRAMUSCULAR | Status: DC | PRN
  Administered 2018-07-30: 0.2 mg via INTRAVENOUS

## 2018-07-30 MED ORDER — SODIUM CHLORIDE 0.9 % IV SOLN
INTRAVENOUS | Status: DC | PRN
  Administered 2018-07-30: 14:00:00 via INTRAVENOUS

## 2018-07-30 MED ORDER — PROPOFOL 500 MG/50ML IV EMUL
INTRAVENOUS | Status: DC | PRN
  Administered 2018-07-30: 200 ug/kg/min via INTRAVENOUS

## 2018-07-30 MED ORDER — LIDOCAINE 2% (20 MG/ML) 5 ML SYRINGE
INTRAMUSCULAR | Status: DC | PRN
  Administered 2018-07-30: 100 mg via INTRAVENOUS

## 2018-07-30 MED ORDER — PROPOFOL 10 MG/ML IV BOLUS
INTRAVENOUS | Status: DC | PRN
  Administered 2018-07-30: 70 mg via INTRAVENOUS

## 2018-07-30 MED ORDER — DIPHENOXYLATE-ATROPINE 2.5-0.025 MG PO TABS
2.0000 | ORAL_TABLET | Freq: Three times a day (TID) | ORAL | Status: DC
  Administered 2018-07-30 – 2018-08-01 (×6): 2 via ORAL
  Filled 2018-07-30 (×6): qty 2

## 2018-07-30 MED ORDER — PROPOFOL 500 MG/50ML IV EMUL
INTRAVENOUS | Status: AC
  Filled 2018-07-30: qty 50

## 2018-07-30 MED ORDER — OLANZAPINE 5 MG PO TBDP
5.0000 mg | ORAL_TABLET | Freq: Every day | ORAL | Status: DC
  Administered 2018-07-30 – 2018-07-31 (×2): 5 mg via ORAL
  Filled 2018-07-30 (×3): qty 1

## 2018-07-30 MED ORDER — SODIUM CHLORIDE 0.9 % IJ SOLN
1000.0000 mL | INTRAMUSCULAR | Status: DC | PRN
  Administered 2018-07-30: 1000 mL via INTRAVENOUS
  Filled 2018-07-30: qty 1000

## 2018-07-30 NOTE — Progress Notes (Signed)
Patient ID: Joanna Hall, female   DOB: 11-24-1965, 52 y.o.   MRN: [id number]   [employer] PROGRESS NOTE  JAMYLA ARD MEQ:[id number] DOB: 12/26/1965 DOA: 07/27/2018 PCP: Arnetha Courser, MD  HPI/Subjective: Patient not feeling well.  Having nausea, abdominal pain and diarrhea.  Had colonoscopy prep last night and still having bowel movements that were brown.  Objective: Vitals:   07/30/18 0541 07/30/18 1253  BP: (!) 163/97 (!) 143/80  Pulse: 67 66  Resp: 16 16  Temp: 98.1 F (36.7 C) 97.6 F (36.4 C)  SpO2: 100% 99%    Filed Weights   07/27/18 1019  Weight: 72.6 kg    ROS: Review of Systems  Constitutional: Negative for chills and fever.  Eyes: Negative for blurred vision.  Respiratory: Negative for cough and shortness of breath.   Cardiovascular: Negative for chest pain.  Gastrointestinal: Positive for abdominal pain, diarrhea and nausea. Negative for constipation and vomiting.  Genitourinary: Negative for dysuria.  Musculoskeletal: Negative for joint pain.  Neurological: Negative for dizziness and headaches.   Exam: Physical Exam  Constitutional: She is oriented to person, place, and time.  HENT:  Nose: No mucosal edema.  Mouth/Throat: No oropharyngeal exudate or posterior oropharyngeal edema.  Eyes: Pupils are equal, round, and reactive to light. Conjunctivae, EOM and lids are normal.  Neck: No JVD present. Carotid bruit is not present. No edema present. No thyroid mass and no thyromegaly present.  Cardiovascular: S1 normal and S2 normal. Exam reveals no gallop.  No murmur heard. Pulses:      Dorsalis pedis pulses are 2+ on the right side, and 2+ on the left side.  Respiratory: No respiratory distress. She has no wheezes. She has no rhonchi. She has no rales.  GI: Soft. Bowel sounds are normal. There is generalized tenderness.  Musculoskeletal:       Right ankle: She exhibits no swelling.       Left ankle: She exhibits no swelling.   Lymphadenopathy:    She has no cervical adenopathy.  Neurological: She is alert and oriented to person, place, and time. No cranial nerve deficit.  Skin: Skin is warm. No rash noted. Nails show no clubbing.  Psychiatric: She has a normal mood and affect.      Data Reviewed: Basic Metabolic Panel: Recent Labs  Lab 07/27/18 1042 07/28/18 0617 07/29/18 0501  NA 137 137 138  K 3.9 3.4* 4.1  CL 103 106 106  CO2 25 25 25   GLUCOSE 155* 95 94  BUN 11 7 6   CREATININE 0.53 0.59 0.58  CALCIUM 9.4 8.4* 8.5*  MG 1.5* 1.5* 1.4*  PHOS  --  3.4  --    Liver Function Tests: Recent Labs  Lab 07/27/18 1042  AST 40  ALT 58*  ALKPHOS 153*  BILITOT 0.9  PROT 7.7  ALBUMIN 4.2   Recent Labs  Lab 07/27/18 1042  LIPASE 38   CBC: Recent Labs  Lab 07/27/18 1042 07/28/18 0617 07/29/18 0501 07/30/18 0510  WBC 9.0 4.2 3.0* 2.3*  NEUTROABS 6.7  --  2.2 1.2*  HGB 11.1* 8.8* 9.1* 8.8*  HCT 33.2* 26.6* 27.1* 26.4*  MCV 99.4 101.9* 100.0 100.8*  PLT 111* 72* 69* 66*   Cardiac Enzymes: Recent Labs  Lab 07/27/18 1042  TROPONINI <0.03     Recent Results (from the past 240 hour(s))  C difficile quick scan w PCR reflex     Status: None   Collection Time: 07/28/18  4:56 AM  Result Value Ref Range Status   C Diff antigen NEGATIVE NEGATIVE Final   C Diff toxin NEGATIVE NEGATIVE Final   C Diff interpretation No C. difficile detected.  Final    Comment: Performed at [facility], [address] [place], [place] [address]  Gastrointestinal Panel by PCR , Stool     Status: None   Collection Time: 07/28/18  1:44 PM  Result Value Ref Range Status   Campylobacter species NOT DETECTED NOT DETECTED Final   Plesimonas shigelloides NOT DETECTED NOT DETECTED Final   Salmonella species NOT DETECTED NOT DETECTED Final   Yersinia enterocolitica NOT DETECTED NOT DETECTED Final   Vibrio species NOT DETECTED NOT DETECTED Final   Vibrio cholerae NOT DETECTED NOT DETECTED Final    Enteroaggregative E coli (EAEC) NOT DETECTED NOT DETECTED Final   Enteropathogenic E coli (EPEC) NOT DETECTED NOT DETECTED Final   Enterotoxigenic E coli (ETEC) NOT DETECTED NOT DETECTED Final   Shiga like toxin producing E coli (STEC) NOT DETECTED NOT DETECTED Final   Shigella/Enteroinvasive E coli (EIEC) NOT DETECTED NOT DETECTED Final   Cryptosporidium NOT DETECTED NOT DETECTED Final   Cyclospora cayetanensis NOT DETECTED NOT DETECTED Final   Entamoeba histolytica NOT DETECTED NOT DETECTED Final   Giardia lamblia NOT DETECTED NOT DETECTED Final   Adenovirus F40/41 NOT DETECTED NOT DETECTED Final   Astrovirus NOT DETECTED NOT DETECTED Final   Norovirus GI/GII NOT DETECTED NOT DETECTED Final   Rotavirus A NOT DETECTED NOT DETECTED Final   Sapovirus (I, II, IV, and V) NOT DETECTED NOT DETECTED Final    Comment: Performed at [facility], [address] [place], [place] [address]     Studies: No results found.  Scheduled Meds: . [MAR Hold] colestipol  2 g Oral BID  . [MAR Hold] dicyclomine  20 mg Oral Q6H  . [MAR Hold] diphenoxylate-atropine  1 tablet Oral TID  . [MAR Hold] feeding supplement  1 Container Oral TID BM  . [MAR Hold] loratadine  10 mg Oral Daily  . [MAR Hold] multivitamin with minerals  1 tablet Oral Daily  . [MAR Hold] polyethylene glycol powder  1 Container Oral Once  . [MAR Hold] rivaroxaban  20 mg Oral Q supper  . [MAR Hold] sertraline  50 mg Oral Daily   Continuous Infusions: . 0.9 % NaCl with KCl 20 mEq / L 75 mL/hr at 07/30/18 1025    Assessment/Plan:  1. Colitis seen on CT scan.  GI recommended stopping antibiotics.  Stool for C. difficile negative.  Stool comprehensive panel negative.  Colonoscopy today for further evaluation. 2. Hypomagnesemia replaced 3. Hypokalemia replaced in IV fluids 4. 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  Follows with oncology.  6. Pancytopenia.   Platelet count down to 66.  Continue to monitor.  Code Status:     Code Status Orders  (From admission, onward)         Start     Ordered   07/27/18 1622  Full code  Continuous     07/27/18 1621        Code Status History    Date Active Date Inactive Code Status Order ID Comments User Context   06/10/2018 1754 06/12/2018 1614 Full Code [id number]  Dustin Flock, MD Inpatient   04/24/2018 1451 04/27/2018 1700 Full Code [id number]  Lovell Sheehan, MD Inpatient   12/03/2017 1619 12/08/2017 1606 Full Code [id number]  Vianne Bulls,  Lise Auer, MD ED   09/12/2017 0024 09/15/2017 1721 Full Code [id number]  [name], MD ED   08/27/2017 0223 09/05/2017 1355 Full Code [id number]  Herbert Pun, MD Inpatient    Advance Directive Documentation     Most Recent Value  Type of Advance Directive  Healthcare Power of [place], Living will  Pre-existing out of facility DNR order (yellow form or pink MOST form)  -  "MOST" Form in Place?  -     Disposition Plan: Continue to evaluate daily  Consultants:  Oncology  Gastroenterology  Time spent: 25 minutes.   Aariv Medlock [employer]

## 2018-07-30 NOTE — Op Note (Signed)
[facility] Gastroenterology Patient Name: Joanna Hall Procedure Date: 07/30/2018 2:01 PM MRN: [id number] Account #: [id number] Date of Birth: 25-Dec-1965 Admit Type: Inpatient Age: 52 Room: [facility] ENDO ROOM 4 Gender: Female Note Status: Finalized Procedure:            Colonoscopy Indications:          Chronic diarrhea, Abnormal CT of the GI tract Providers:            [name] MD, MD Referring MD:         Arnetha Courser (Referring MD) Medicines:            Monitored Anesthesia Care Complications:        No immediate complications. Estimated blood loss: None. Procedure:            Pre-Anesthesia Assessment:                       - Prior to the procedure, a History and Physical was                        performed, and patient medications and allergies were                        reviewed. The patient is competent. The risks and                        benefits of the procedure and the sedation options and                        risks were discussed with the patient. All questions                        were answered and informed consent was obtained.                        Patient identification and proposed procedure were                        verified by the physician, the nurse, the                        anesthesiologist, the anesthetist and the technician in                        the pre-procedure area in the procedure room in the                        endoscopy suite. Mental Status Examination: alert and                        oriented. Airway Examination: normal oropharyngeal                        airway and neck mobility. Respiratory Examination:                        clear to auscultation. CV Examination: normal.                        Prophylactic Antibiotics: The patient does not require  prophylactic antibiotics. Prior Anticoagulants: The                        patient has taken Xarelto (rivaroxaban), last dose  was                        day of procedure. ASA Grade Assessment: III - A patient                        with severe systemic disease. After reviewing the risks                        and benefits, the patient was deemed in satisfactory                        condition to undergo the procedure. The anesthesia plan                        was to use monitored anesthesia care (MAC). Immediately                        prior to administration of medications, the patient was                        re-assessed for adequacy to receive sedatives. The                        heart rate, respiratory rate, oxygen saturations, blood                        pressure, adequacy of pulmonary ventilation, and                        response to care were monitored throughout the                        procedure. The physical status of the patient was                        re-assessed after the procedure.                       After obtaining informed consent, the colonoscope was                        passed under direct vision. Throughout the procedure,                        the patient's blood pressure, pulse, and oxygen                        saturations were monitored continuously. The                        Colonoscope was introduced through the anus and                        advanced to the the ileocolonic anastomosis. The  colonoscopy was performed without difficulty. The                        patient tolerated the procedure well. The quality of                        the bowel preparation was fair. Findings:      The perianal and digital rectal examinations were normal. Pertinent       negatives include no palpable rectal lesions.      The perianal exam findings include decreased sphincter tone.      There was evidence of a prior end-to-side ileo-colonic anastomosis in       the ascending colon. This was patent and was characterized by healthy       appearing mucosa. The  anastomosis was traversed.      The neo-terminal ileum appeared normal. Biopsies were taken with a cold       forceps for histology.      A moderate amount of liquid yellow stool was found in the entire colon.      Normal mucosa was found in the entire colon. Biopsies were taken with a       cold forceps for histology. Impression:           - Preparation of the colon was fair.                       - Decreased sphincter tone found on perianal exam.                       - Patent end-to-side ileo-colonic anastomosis,                        characterized by healthy appearing mucosa.                       - The examined portion of the ileum was normal.                        Biopsied.                       - Stool in the entire examined colon.                       - Normal mucosa in the entire examined colon. Biopsied. Recommendation:       - Return patient to hospital ward for ongoing care.                       - Resume previous diet today.                       - Continue present medications.                       - Await pathology results.                       - Start tincture of opium as diarrhea is uncontrolled                       - Check NE markers Procedure Code(s):    --- Professional ---  45380, Colonoscopy, flexible; with biopsy, single or                        multiple Diagnosis Code(s):    --- Professional ---                       Z98.0, Intestinal bypass and anastomosis status                       K52.9, Noninfective gastroenteritis and colitis,                        unspecified                       R93.3, Abnormal findings on diagnostic imaging of other                        parts of digestive tract CPT copyright 2018 American Medical Association. All rights reserved. The codes documented in this report are preliminary and upon coder review may  be revised to meet current compliance requirements. Dr. Ulyess Mort [name] MD,  MD 07/30/2018 2:36:27 PM This report has been signed electronically. Number of Addenda: 0 Note Initiated On: 07/30/2018 2:01 PM Scope Withdrawal Time: 0 hours 6 minutes 8 seconds  Total Procedure Duration: 0 hours 7 minutes 44 seconds       [facility]

## 2018-07-30 NOTE — Plan of Care (Signed)
  Problem: Education: Goal: Knowledge of General Education information will improve Description Including pain rating scale, medication(s)/side effects and non-pharmacologic comfort measures 07/30/2018 0312 by Jenene Slicker, RN Outcome: Progressing 07/30/2018 0312 by Jenene Slicker, RN Outcome: Progressing   Problem: Health Behavior/Discharge Planning: Goal: Ability to manage health-related needs will improve 07/30/2018 0312 by Jenene Slicker, RN Outcome: Progressing 07/30/2018 0312 by Jenene Slicker, RN Outcome: Progressing   Problem: Clinical Measurements: Goal: Ability to maintain clinical measurements within normal limits will improve 07/30/2018 0312 by Jenene Slicker, RN Outcome: Progressing 07/30/2018 0312 by Jacelyn Pi D, RN Outcome: Progressing Goal: Will remain free from infection 07/30/2018 0312 by Jenene Slicker, RN Outcome: Progressing 07/30/2018 0312 by Jacelyn Pi D, RN Outcome: Progressing Goal: Diagnostic test results will improve 07/30/2018 0312 by Jenene Slicker, RN Outcome: Progressing 07/30/2018 0312 by Jacelyn Pi D, RN Outcome: Progressing Goal: Respiratory complications will improve 07/30/2018 0312 by Jenene Slicker, RN Outcome: Progressing 07/30/2018 0312 by Jacelyn Pi D, RN Outcome: Progressing Goal: Cardiovascular complication will be avoided 07/30/2018 0312 by Jenene Slicker, RN Outcome: Progressing 07/30/2018 0312 by Jenene Slicker, RN Outcome: Progressing   Problem: Activity: Goal: Risk for activity intolerance will decrease 07/30/2018 0312 by Jenene Slicker, RN Outcome: Progressing 07/30/2018 0312 by Jacelyn Pi D, RN Outcome: Progressing   Problem: Nutrition: Goal: Adequate nutrition will be maintained 07/30/2018 0312 by Jenene Slicker, RN Outcome: Progressing 07/30/2018 0312 by Jacelyn Pi D, RN Outcome: Progressing   Problem: Coping: Goal: Level of anxiety will decrease 07/30/2018 0312 by Jenene Slicker, RN Outcome: Progressing 07/30/2018 0312 by Jacelyn Pi D, RN Outcome: Progressing   Problem: Elimination: Goal: Will not experience complications related to bowel motility 07/30/2018 0312 by Jenene Slicker, RN Outcome: Progressing 07/30/2018 0312 by Jenene Slicker, RN Outcome: Progressing Goal: Will not experience complications related to urinary retention 07/30/2018 0312 by Jenene Slicker, RN Outcome: Progressing 07/30/2018 0312 by Jacelyn Pi D, RN Outcome: Progressing   Problem: Safety: Goal: Ability to remain free from injury will improve 07/30/2018 0312 by Jenene Slicker, RN Outcome: Progressing 07/30/2018 0312 by Jacelyn Pi D, RN Outcome: Progressing   Problem: Skin Integrity: Goal: Risk for impaired skin integrity will decrease 07/30/2018 0312 by Jenene Slicker, RN Outcome: Progressing 07/30/2018 0312 by Jenene Slicker, RN Outcome: Progressing

## 2018-07-30 NOTE — Transfer of Care (Signed)
Immediate Anesthesia Transfer of Care Note  Patient: Joanna Hall  Procedure(s) Performed: COLONOSCOPY WITH PROPOFOL (N/A ) ESOPHAGOGASTRODUODENOSCOPY (EGD) WITH PROPOFOL  Patient Location: Endoscopy Unit  Anesthesia Type:General  Level of Consciousness: sedated  Airway & Oxygen Therapy: Patient connected to nasal cannula oxygen  Post-op Assessment: Post -op Vital signs reviewed and stable  Post vital signs: stable  Last Vitals:  Vitals Value Taken Time  BP 119/80 07/30/2018  2:38 PM  Temp 36.1 C 07/30/2018  2:36 PM  Pulse 102 07/30/2018  2:38 PM  Resp 20 07/30/2018  2:38 PM  SpO2 100 % 07/30/2018  2:38 PM  Vitals shown include unvalidated device data.  Last Pain:  Vitals:   07/30/18 1436  TempSrc: Tympanic  PainSc: Asleep      Patients Stated Pain Goal: 2 (35/52/17 4715)  Complications: No apparent anesthesia complications

## 2018-07-30 NOTE — Op Note (Signed)
[facility]astroenter[facility] Patient Name: Joanna Hall Procedure Date: 07/30/2018 2:03 PM MRN: [id number] Account #: [id number] Date of Birth: 1966/10/04 Admit Type: Inpatient Age: 51 Room: [facility] ENDO ROOM 4 Gender: Female Note Status: Finalized Procedure:            Upper GI endoscopy Indications:          Diarrhea, Nausea with vomiting, Persistent vomiting Providers:            [name] MD, MD Referring MD:         Arnetha Courser (Referring MD) Medicines:            Monitored Anesthesia Care Complications:        No immediate complications. Estimated blood loss: None. Procedure:            Pre-Anesthesia Assessment:                       - Prior to the procedure, a History and Physical was                        performed, and patient medications and allergies were                        reviewed. The patient is competent. The risks and                        benefits of the procedure and the sedation options and                        risks were discussed with the patient. All questions                        were answered and informed consent was obtained.                        Patient identification and proposed procedure were                        verified by the physician, the nurse, the                        anesthesiologist, the anesthetist and the technician in                        the pre-procedure area in the procedure room in the                        endoscopy suite. Mental Status Examination: alert and                        oriented. Airway Examination: normal oropharyngeal                        airway and neck mobility. Respiratory Examination:                        clear to auscultation. CV Examination: normal.                        Prophylactic Antibiotics: The patient does not require  prophylactic antibiotics. Prior Anticoagulants: The                        patient has taken Xarelto  (rivaroxaban), last dose was                        day of procedure. ASA Grade Assessment: III - A patient                        with severe systemic disease. After reviewing the risks                        and benefits, the patient was deemed in satisfactory                        condition to undergo the procedure. The anesthesia plan                        was to use monitored anesthesia care (MAC). Immediately                        prior to administration of medications, the patient was                        re-assessed for adequacy to receive sedatives. The                        heart rate, respiratory rate, oxygen saturations, blood                        pressure, adequacy of pulmonary ventilation, and                        response to care were monitored throughout the                        procedure. The physical status of the patient was                        re-assessed after the procedure.                       After obtaining informed consent, the endoscope was                        passed under direct vision. Throughout the procedure,                        the patient's blood pressure, pulse, and oxygen                        saturations were monitored continuously. The Endoscope                        was introduced through the mouth, and advanced to the                        second part of duodenum. The upper GI endoscopy was  accomplished without difficulty. The patient tolerated                        the procedure well. Findings:      The duodenal bulb and second portion of the duodenum were normal.       Biopsies were taken with a cold forceps for histology.      Diffuse moderately erythematous mucosa without bleeding was found in the       cardia.      The entire examined stomach was normal.      Bilious fluid was found in the gastric fundus and moderate amounts of       bile refluxing into stomach was noted.      LA Grade A (one or  more mucosal breaks less than 5 mm, not extending       between tops of 2 mucosal folds) esophagitis with no bleeding was found       in the lower third of the esophagus.      The examined esophagus was normal. Impression:           - Normal duodenal bulb and second portion of the                        duodenum. Biopsied.                       - Erythematous mucosa in the cardia.                       - Normal stomach.                       - Bilious gastric fluid.                       - LA Grade A reflux esophagitis.                       - Normal esophagus. Recommendation:       - Await pathology results.                       - Continue PPI BID                       - Questran for bile reflux                       - Zyprexa at bedtime                       - Proceed with colonoscopy as scheduled                       See colonoscopy report Procedure Code(s):    --- Professional ---                       [phone], Esophagogastroduodenoscopy, flexible, transoral;                        with biopsy, single or multiple Diagnosis Code(s):    --- Professional ---                       A25.05,  Other diseases of stomach and duodenum                       K21.0, Gastro-esophageal reflux disease with esophagitis                       R19.7, Diarrhea, unspecified                       R11.10, Vomiting, unspecified                       R11.2, Nausea with vomiting, unspecified CPT copyright 2018 American Medical Association. All rights reserved. The codes documented in this report are preliminary and upon coder review may  be revised to meet current compliance requirements. Dr. Ulyess Mort [name] MD, MD 07/30/2018 2:22:51 PM This report has been signed electronically. Number of Addenda: 0 Note Initiated On: 07/30/2018 2:03 PM      [facility]

## 2018-07-30 NOTE — Anesthesia Post-op Follow-up Note (Signed)
Anesthesia QCDR form completed.        

## 2018-07-30 NOTE — Anesthesia Preprocedure Evaluation (Signed)
Anesthesia Evaluation  Patient identified by MRN, date of birth, ID band Patient awake    Reviewed: Allergy & Precautions, H&P , NPO status , Patient's Chart, lab work & pertinent test results  History of Anesthesia Complications Negative for: history of anesthetic complications  Airway Mallampati: III  TM Distance: >3 FB Neck ROM: limited    Dental  (+) Chipped, Poor Dentition, Missing   Pulmonary shortness of breath and with exertion, pneumonia, former smoker,           Cardiovascular Exercise Tolerance: Good hypertension, (-) angina(-) Past MI      Neuro/Psych PSYCHIATRIC DISORDERS negative neurological ROS     GI/Hepatic PUD, GERD  Medicated and Controlled,(+) Hepatitis -, C  Endo/Other  negative endocrine ROS  Renal/GU Renal disease  negative genitourinary   Musculoskeletal  (+) Arthritis ,   Abdominal   Peds  Hematology negative hematology ROS (+)   Anesthesia Other Findings Patient is NPO appropriate and reports no nausea or vomiting today.   Past Medical History: 09/18/2017: Abnormal cervical Papanicolaou smear No date: Anxiety No date: Aortic atherosclerosis (HCC) No date: Arthritis     Comment:  neck and knees No date: Blood clots in brain     Comment:  both lungs and right kidney No date: Blood transfusion without reported diagnosis No date: Cervical cancer (HCC)     Comment:  mets lung No date: Chronic anal fissure No date: Chronic diarrhea No date: Dyspnea No date: Factor V Leiden mutation ([place]) No date: Fecal incontinence No date: Genital warts No date: GERD (gastroesophageal reflux disease) No date: Heart murmur No date: Hemorrhoids No date: Hepatitis C     Comment:  Chronic, 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  Negative  test 10/2017 left kidney: Infarction of kidney ([place])     Comment:  and uterus 09/18/2017: Intestinal infection due to Clostridium difficile No date: Macrocytic anemia with vitamin B12 deficiency No date: Perianal condylomata No date: Pneumonia     Comment:  History of No date: Pulmonary nodules No date: Rectal bleeding 08/2017: Small bowel obstruction (HCC) No date: Stiff neck     Comment:  limited right turn No date: Vitamin D deficiency  Past Surgical History: No date: CHOLECYSTECTOMY 08/2017: COLON SURGERY     Comment:  resection 12/20/2017: COLONOSCOPY WITH PROPOFOL; N/A     Comment:  Procedure: COLONOSCOPY WITH PROPOFOL;  Surgeon: [name], MD;  Location: [facility];  Service:               Gastroenterology;  Laterality: N/A; No date: DIAGNOSTIC LAPAROSCOPY 12/20/2017: ESOPHAGOGASTRODUODENOSCOPY (EGD) WITH PROPOFOL; N/A     Comment:  Procedure: ESOPHAGOGASTRODUODENOSCOPY (EGD) WITH               PROPOFOL;  Surgeon: [name], MD;  Location:               [facility];  Service: Gastroenterology;  Laterality:               N/A; 08/31/2017: LAPAROTOMY; N/A     Comment:  Procedure: EXPLORATORY LAPAROTOMY for SBO,               ileocolectomy, removal of piece of uterine wall;  Surgeon: [name], MD;  Location: [facility] OR[facility];                Service: General;  Laterality: N/A; 02/22/2018: LASER ABLATION CONDOLAMATA; N/A     Comment:  Procedure: LASER ABLATION/REMOVAL OF CONDOLAMATA AROUND               ANUS AND VAGINA;  Surgeon: Michael Boston, MD;  Location:               [place];  Service: General;                Laterality: N/A; No date: OOPHORECTOMY 05/13/2018: PORTA CATH INSERTION; N/A     Comment:  Procedure: PORTA CATH INSERTION;  Surgeon: Katha Cabal, MD;  Location: [place] [facility];  Service:              Cardiovascular;  Laterality: N/A; No date: SMALL INTESTINE SURGERY No  date: TANDEM RING INSERTION     Comment:  x3 No date: THORACOTOMY 04/24/2018: TOTAL KNEE ARTHROPLASTY; Left     Comment:  Procedure: TOTAL KNEE ARTHROPLASTY;  Surgeon: Lovell Sheehan, MD;  Location: [facility];  Service: Orthopedics;               Laterality: Left;  BMI    Body Mass Index:  25.06 kg/m      Reproductive/Obstetrics negative OB ROS                             Anesthesia Physical Anesthesia Plan  ASA: III  Anesthesia Plan: General   Post-op Pain Management:    Induction: Intravenous  PONV Risk Score and Plan: Propofol infusion and TIVA  Airway Management Planned: Natural Airway and Nasal Cannula  Additional Equipment:   Intra-op Plan:   Post-operative Plan:   Informed Consent: I have reviewed the patients History and Physical, chart, labs and discussed the procedure including the risks, benefits and alternatives for the proposed anesthesia with the patient or authorized representative who has indicated his/her understanding and acceptance.   Dental Advisory Given  Plan Discussed with: Anesthesiologist, CRNA and Surgeon  Anesthesia Plan Comments: (Patient consented for risks of anesthesia including but not limited to:  - adverse reactions to medications - risk of intubation if required - damage to teeth, lips or other oral mucosa - sore throat or hoarseness - Damage to heart, brain, lungs or loss of life  Patient voiced understanding.)        Anesthesia Quick Evaluation

## 2018-07-30 NOTE — Progress Notes (Signed)
EGD and colonoscopy normal mucosa Biopsies performed  Recs: Lactose free diet Increase colestipol to 2gm TID Increase lomotil to 2pills TID Added tincture of opium 1%, 41m every 8hrs Start zyprexa 514mQHS  Will follow  RoCephas DarbyMD [address] [address]Main: [phone]: [phone]: [phone]

## 2018-07-31 LAB — BASIC METABOLIC PANEL
Anion gap: 8 (ref 5–15)
CHLORIDE: 106 mmol/L (ref 98–111)
CO2: 24 mmol/L (ref 22–32)
CREATININE: 0.6 mg/dL (ref 0.44–1.00)
Calcium: 8.6 mg/dL — ABNORMAL LOW (ref 8.9–10.3)
GFR calc Af Amer: >60 mL/min (ref >60)
GFR calc non Af Amer: >60 mL/min (ref >60)
GLUCOSE: 82 mg/dL (ref 70–99)
POTASSIUM: 3.8 mmol/L (ref 3.5–5.1)
Sodium: 138 mmol/L (ref 135–145)

## 2018-07-31 LAB — CBC WITH DIFFERENTIAL/PLATELET
Abs Immature Granulocytes: 0.18 10*3/uL — ABNORMAL HIGH (ref 0.00–0.07)
Basophils Absolute: 0 10*3/uL (ref 0.0–0.1)
Basophils Relative: 1 %
Eosinophils Absolute: 0.1 10*3/uL (ref 0.0–0.5)
Eosinophils Relative: 4 %
HCT: 25.7 % — ABNORMAL LOW (ref 36.0–46.0)
Hemoglobin: 8.6 g/dL — ABNORMAL LOW (ref 12.0–15.0)
Immature Granulocytes: 6 %
Lymphocytes Relative: 25 %
Lymphs Abs: 0.8 10*3/uL (ref 0.7–4.0)
MCH: 33.2 pg (ref 26.0–34.0)
MCHC: 33.5 g/dL (ref 30.0–36.0)
MCV: 99.2 fL (ref 80.0–100.0)
Monocytes Absolute: 0.4 10*3/uL (ref 0.1–1.0)
Monocytes Relative: 14 %
Neutro Abs: 1.6 10*3/uL — ABNORMAL LOW (ref 1.7–7.7)
Neutrophils Relative %: 50 %
Platelets: 66 10*3/uL — ABNORMAL LOW (ref 150–400)
RBC: 2.59 MIL/uL — ABNORMAL LOW (ref 3.87–5.11)
RDW: 14.9 % (ref 11.5–15.5)
Smear Review: NORMAL
WBC: 3.1 10*3/uL — ABNORMAL LOW (ref 4.0–10.5)
nRBC: 0 % (ref 0.0–0.2)

## 2018-07-31 LAB — MAGNESIUM: Magnesium: 1.3 mg/dL — ABNORMAL LOW (ref 1.7–2.4)

## 2018-07-31 MED ORDER — MAGNESIUM SULFATE 2 GM/50ML IV SOLN
2.0000 g | Freq: Once | INTRAVENOUS | Status: AC
  Administered 2018-07-31: 2 g via INTRAVENOUS
  Filled 2018-07-31: qty 50

## 2018-07-31 MED ORDER — MAGNESIUM SULFATE 4 GM/100ML IV SOLN
4.0000 g | Freq: Once | INTRAVENOUS | Status: AC
  Administered 2018-07-31: 14:00:00 4 g via INTRAVENOUS
  Filled 2018-07-31: qty 100

## 2018-07-31 NOTE — Anesthesia Postprocedure Evaluation (Signed)
Anesthesia Post Note  Patient: Joanna Hall  Procedure(s) Performed: COLONOSCOPY WITH PROPOFOL (N/A ) ESOPHAGOGASTRODUODENOSCOPY (EGD) WITH PROPOFOL  Patient location during evaluation: Endoscopy Anesthesia Type: General Level of consciousness: awake and alert Pain management: pain level controlled Vital Signs Assessment: post-procedure vital signs reviewed and stable Respiratory status: spontaneous breathing, nonlabored ventilation, respiratory function stable and patient connected to nasal cannula oxygen Cardiovascular status: blood pressure returned to baseline and stable Postop Assessment: no apparent nausea or vomiting Anesthetic complications: no     Last Vitals:  Vitals:   07/30/18 1926 07/31/18 0429  BP: (!) 153/87 (!) 141/77  Pulse: 77 63  Resp: 18 16  Temp: 36.7 C 36.6 C  SpO2: 100% 100%    Last Pain:  Vitals:   07/31/18 0429  TempSrc: Oral  PainSc:                  [name]

## 2018-07-31 NOTE — Progress Notes (Signed)
Patient ID: Joanna Hall, female   DOB: 1966/08/07, 52 y.o.   MRN: [id number]   [employer] PROGRESS NOTE  Joanna Hall WHQ:[id number] DOB: 1966/03/27 DOA: 07/27/2018 PCP: Arnetha Courser, MD  HPI/Subjective: Patient still feeling noticeable.  Had nausea vomiting today abdominal pain even diarrhea last night after colonoscopy.  Objective: Vitals:   07/30/18 1926 07/31/18 0429  BP: (!) 153/87 (!) 141/77  Pulse: 77 63  Resp: 18 16  Temp: 98 F (36.7 C) 97.8 F (36.6 C)  SpO2: 100% 100%    Filed Weights   07/27/18 1019  Weight: 72.6 kg    ROS: Review of Systems  Constitutional: Negative for chills and fever.  Eyes: Negative for blurred vision.  Respiratory: Negative for cough and shortness of breath.   Cardiovascular: Negative for chest pain.  Gastrointestinal: Positive for abdominal pain, diarrhea, nausea and vomiting. Negative for constipation.  Genitourinary: Negative for dysuria.  Musculoskeletal: Negative for joint pain.  Neurological: Negative for dizziness and headaches.   Exam: Physical Exam  Constitutional: She is oriented to person, place, and time.  HENT:  Nose: No mucosal edema.  Mouth/Throat: No oropharyngeal exudate or posterior oropharyngeal edema.  Eyes: Pupils are equal, round, and reactive to light. Conjunctivae, EOM and lids are normal.  Neck: No JVD present. Carotid bruit is not present. No edema present. No thyroid mass and no thyromegaly present.  Cardiovascular: S1 normal and S2 normal. Exam reveals no gallop.  No murmur heard. Pulses:      Dorsalis pedis pulses are 2+ on the right side, and 2+ on the left side.  Respiratory: No respiratory distress. She has no wheezes. She has no rhonchi. She has no rales.  GI: Soft. Bowel sounds are normal. There is generalized tenderness.  Musculoskeletal:       Right ankle: She exhibits no swelling.       Left ankle: She exhibits no swelling.  Lymphadenopathy:    She has no cervical  adenopathy.  Neurological: She is alert and oriented to person, place, and time. No cranial nerve deficit.  Skin: Skin is warm. No rash noted. Nails show no clubbing.  Psychiatric: She has a normal mood and affect.      Data Reviewed: Basic Metabolic Panel: Recent Labs  Lab 07/27/18 1042 07/28/18 0617 07/29/18 0501 07/31/18 0540  NA 137 137 138 138  K 3.9 3.4* 4.1 3.8  CL 103 106 106 106  CO2 25 25 25 24   GLUCOSE 155* 95 94 82  BUN 11 7 6  <5*  CREATININE 0.53 0.59 0.58 0.60  CALCIUM 9.4 8.4* 8.5* 8.6*  MG 1.5* 1.5* 1.4* 1.3*  PHOS  --  3.4  --   --    Liver Function Tests: Recent Labs  Lab 07/27/18 1042  AST 40  ALT 58*  ALKPHOS 153*  BILITOT 0.9  PROT 7.7  ALBUMIN 4.2   Recent Labs  Lab 07/27/18 1042  LIPASE 38   CBC: Recent Labs  Lab 07/27/18 1042 07/28/18 0617 07/29/18 0501 07/30/18 0510 07/31/18 0540  WBC 9.0 4.2 3.0* 2.3* 3.1*  NEUTROABS 6.7  --  2.2 1.2* 1.6*  HGB 11.1* 8.8* 9.1* 8.8* 8.6*  HCT 33.2* 26.6* 27.1* 26.4* 25.7*  MCV 99.4 101.9* 100.0 100.8* 99.2  PLT 111* 72* 69* 66* 66*   Cardiac Enzymes: Recent Labs  Lab 07/27/18 1042  TROPONINI <0.03     Recent Results (from the past 240 hour(s))  C difficile quick scan w PCR reflex  Status: None   Collection Time: 07/28/18  4:56 AM  Result Value Ref Range Status   C Diff antigen NEGATIVE NEGATIVE Final   C Diff toxin NEGATIVE NEGATIVE Final   C Diff interpretation No C. difficile detected.  Final    Comment: Performed at [facility], [address] [place], [place] [address]  Gastrointestinal Panel by PCR , Stool     Status: None   Collection Time: 07/28/18  1:44 PM  Result Value Ref Range Status   Campylobacter species NOT DETECTED NOT DETECTED Final   Plesimonas shigelloides NOT DETECTED NOT DETECTED Final   Salmonella species NOT DETECTED NOT DETECTED Final   Yersinia enterocolitica NOT DETECTED NOT DETECTED Final   Vibrio species NOT DETECTED NOT DETECTED Final    Vibrio cholerae NOT DETECTED NOT DETECTED Final   Enteroaggregative E coli (EAEC) NOT DETECTED NOT DETECTED Final   Enteropathogenic E coli (EPEC) NOT DETECTED NOT DETECTED Final   Enterotoxigenic E coli (ETEC) NOT DETECTED NOT DETECTED Final   Shiga like toxin producing E coli (STEC) NOT DETECTED NOT DETECTED Final   Shigella/Enteroinvasive E coli (EIEC) NOT DETECTED NOT DETECTED Final   Cryptosporidium NOT DETECTED NOT DETECTED Final   Cyclospora cayetanensis NOT DETECTED NOT DETECTED Final   Entamoeba histolytica NOT DETECTED NOT DETECTED Final   Giardia lamblia NOT DETECTED NOT DETECTED Final   Adenovirus F40/41 NOT DETECTED NOT DETECTED Final   Astrovirus NOT DETECTED NOT DETECTED Final   Norovirus GI/GII NOT DETECTED NOT DETECTED Final   Rotavirus A NOT DETECTED NOT DETECTED Final   Sapovirus (I, II, IV, and V) NOT DETECTED NOT DETECTED Final    Comment: Performed at [facility], [address] [place], [place] [address]      Scheduled Meds: . colestipol  2 g Oral TID WC  . dicyclomine  20 mg Oral Q6H  . diphenoxylate-atropine  2 tablet Oral TID  . feeding supplement  1 Container Oral TID BM  . loratadine  10 mg Oral Daily  . multivitamin with minerals  1 tablet Oral Daily  . OLANZapine zydis  5 mg Oral QHS  . Opium  1 mL Oral TID WC  . pantoprazole  40 mg Oral BID AC  . polyethylene glycol powder  1 Container Oral Once  . rivaroxaban  20 mg Oral Q supper  . sertraline  50 mg Oral Daily   Continuous Infusions: . 0.9 % NaCl with KCl 20 mEq / L 75 mL/hr at 07/31/18 0526  . magnesium sulfate 1 - 4 g bolus IVPB 4 g (07/31/18 1346)    Assessment/Plan:  1. Colitis seen on CT scan.  GI recommended stopping antibiotics.  Stool for C. difficile negative.  Stool comprehensive panel negative.  Colonoscopy negative.  This could be secondary to chemotherapy that the patient is receiving. 2. Hypomagnesemia replacing IV today 3. Hypokalemia replacing in IV  fluids 4. 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  Follows with oncology.  6. Pancytopenia.  Platelet count down to 66.  Continue to monitor.  Code Status:     Code Status Orders  (From admission, onward)         Start     Ordered   07/27/18 1622  Full code  Continuous     07/27/18 1621        Code Status History    Date Active Date Inactive Code Status Order ID Comments  User Context   06/10/2018 1754 06/12/2018 1614 Full Code [id number]  Dustin Flock, MD Inpatient   04/24/2018 1451 04/27/2018 1700 Full Code [id number]  Lovell Sheehan, MD Inpatient   12/03/2017 1619 12/08/2017 1606 Full Code [id number]  Epifanio Lesches, MD ED   09/12/2017 0024 09/15/2017 1721 Full Code [id number]  [name], MD ED   08/27/2017 0223 09/05/2017 1355 Full Code [id number]  Herbert Pun, MD Inpatient    Advance Directive Documentation     Most Recent Value  Type of Advance Directive  Healthcare Power of Attorney, Living will  Pre-existing out of facility DNR order (yellow form or pink MOST form)  -  "MOST" Form in Place?  -     Disposition Plan: Continue to evaluate daily  Consultants:  Oncology  Gastroenterology  Time spent: 24 minutes.   Mavric Cortright [employer]

## 2018-07-31 NOTE — Progress Notes (Signed)
[facility] Hematology/Oncology Progress Note  Date of admission: 07/27/2018  Hospital day:  07/31/2018  Chief Complaint: Joanna Hall is a 52 y.o. female with metastatic cervical cancer who was admitted through the emergency room with colitis.  Subjective:  Patient had another rough night with nausea, diarrhea, and abdominal pain.  Social History: The patient is alone today.  Allergies:  Allergies  Allergen Reactions  . Ketamine Anxiety and Other (See Comments)    Syncope episode/confusion     Scheduled Medications: . colestipol  2 g Oral TID WC  . dicyclomine  20 mg Oral Q6H  . diphenoxylate-atropine  2 tablet Oral TID  . feeding supplement  1 Container Oral TID BM  . loratadine  10 mg Oral Daily  . multivitamin with minerals  1 tablet Oral Daily  . OLANZapine zydis  5 mg Oral QHS  . Opium  1 mL Oral TID WC  . pantoprazole  40 mg Oral BID AC  . polyethylene glycol powder  1 Container Oral Once  . rivaroxaban  20 mg Oral Q supper  . sertraline  50 mg Oral Daily    Review of Systems: GENERAL:  Feels tired.  Rough night.  No fevers, sweats. PERFORMANCE STATUS (ECOG):  2 HEENT:  No visual changes, runny nose, sore throat, mouth sores or tenderness. Lungs: No shortness of breath or cough.  No hemoptysis. Cardiac:  No chest pain, palpitations, orthopnea, or PND. GI:  Increased abdominal pain.  Nausea.  No emesis.  Diarrhea.  No melena or hematochezia. GU:  No urgency, frequency, dysuria, or hematuria. Musculoskeletal:  No back pain.  No joint pain.  No muscle tenderness. Extremities:  No pain or swelling. Skin:  No rashes or skin changes. Neuro:  No headache, numbness or weakness, balance or coordination issues. Endocrine:  No diabetes, thyroid issues, hot flashes or night sweats. Psych:  No mood changes, depression or anxiety. Pain:  Increased abdominal pain. Review of systems:  All other systems reviewed and found to be negative.   Physical  Exam: Blood pressure (!) 141/77, pulse 63, temperature 97.8 F (36.6 C), temperature source Oral, resp. rate 16, height 5' 7"  (1.702 m), weight 160 lb (72.6 kg), SpO2 100 %.  GENERAL:  Fatigued appearing woman lying in bed in no acute distress. MENTAL STATUS:  Alert and oriented to person, place and time. HEAD:  Near alopecia.  Normocephalic, atraumatic, face symmetric, no Cushingoid features. EYES:  Blue eyes.  Pupils equal round and reactive to light and accomodation.  No conjunctivitis or scleral icterus. ENT:  Oropharynx clear without lesion.  Tongue normal. Mucous membranes dry.  RESPIRATORY:  Clear to auscultation without rales, wheezes or rhonchi. CARDIOVASCULAR:  Regular rate and rhythm without murmur, rub or gallop. ABDOMEN:  Soft, diffusely tender (most pronounced upper quadrants) without guarding or rebound tenderness.  Active bowel sounds and no hepatosplenomegaly.  No masses. SKIN:  No rashes, ulcers or lesions. EXTREMITIES: No edema, no skin discoloration or tenderness.  No palpable cords. NEUROLOGICAL: Unremarkable. PSYCH:  Appropriate.    Results for orders placed or performed during the hospital encounter of 07/27/18 (from the past 48 hour(s))  CBC with Differential     Status: Abnormal   Collection Time: 07/30/18  5:10 AM  Result Value Ref Range   WBC 2.3 (L) 4.0 - 10.5 K/uL   RBC 2.62 (L) 3.87 - 5.11 MIL/uL   Hemoglobin 8.8 (L) 12.0 - 15.0 g/dL   HCT 26.4 (L) 36.0 - 46.0 %  MCV 100.8 (H) 80.0 - 100.0 fL   MCH 33.6 26.0 - 34.0 pg   MCHC 33.3 30.0 - 36.0 g/dL   RDW 15.3 11.5 - 15.5 %   Platelets 66 (L) 150 - 400 K/uL    Comment: Immature Platelet Fraction may be clinically indicated, consider ordering this additional test [id number]    nRBC 0.0 0.0 - 0.2 %   Neutrophils Relative % 51 %   Neutro Abs 1.2 (L) 1.7 - 7.7 K/uL   Lymphocytes Relative 30 %   Lymphs Abs 0.7 0.7 - 4.0 K/uL   Monocytes Relative 12 %   Monocytes Absolute 0.3 0.1 - 1.0 K/uL   Eosinophils  Relative 6 %   Eosinophils Absolute 0.1 0.0 - 0.5 K/uL   Basophils Relative 0 %   Basophils Absolute 0.0 0.0 - 0.1 K/uL   Immature Granulocytes 1 %   Abs Immature Granulocytes 0.03 0.00 - 0.07 K/uL    Comment: Performed at [facility], [address] [place], [place] [address]  CBC with Differential     Status: Abnormal   Collection Time: 07/31/18  5:40 AM  Result Value Ref Range   WBC 3.1 (L) 4.0 - 10.5 K/uL   RBC 2.59 (L) 3.87 - 5.11 MIL/uL   Hemoglobin 8.6 (L) 12.0 - 15.0 g/dL   HCT 25.7 (L) 36.0 - 46.0 %   MCV 99.2 80.0 - 100.0 fL   MCH 33.2 26.0 - 34.0 pg   MCHC 33.5 30.0 - 36.0 g/dL   RDW 14.9 11.5 - 15.5 %   Platelets 66 (L) 150 - 400 K/uL    Comment: Immature Platelet Fraction may be clinically indicated, consider ordering this additional test [id number]    nRBC 0.0 0.0 - 0.2 %   Neutrophils Relative % 50 %   Neutro Abs 1.6 (L) 1.7 - 7.7 K/uL   Lymphocytes Relative 25 %   Lymphs Abs 0.8 0.7 - 4.0 K/uL   Monocytes Relative 14 %   Monocytes Absolute 0.4 0.1 - 1.0 K/uL   Eosinophils Relative 4 %   Eosinophils Absolute 0.1 0.0 - 0.5 K/uL   Basophils Relative 1 %   Basophils Absolute 0.0 0.0 - 0.1 K/uL   Smear Review Normal platelet morphology    Immature Granulocytes 6 %   Abs Immature Granulocytes 0.18 (H) 0.00 - 0.07 K/uL    Comment: Performed at [facility], [address] [place], [place] [address]  Magnesium     Status: Abnormal   Collection Time: 07/31/18  5:40 AM  Result Value Ref Range   Magnesium 1.3 (L) 1.7 - 2.4 mg/dL    Comment: Performed at [facility], [address] [place], [place] [address]  Basic metabolic panel     Status: Abnormal   Collection Time: 07/31/18  5:40 AM  Result Value Ref Range   Sodium 138 135 - 145 mmol/L    Comment: RESULTS VERIFIED BY REPEAT TESTING/HKP   Potassium 3.8 3.5 - 5.1 mmol/L   Chloride 106 98 - 111 mmol/L   CO2 24 22 - 32 mmol/L   Glucose, Bld 82 70 - 99 mg/dL   BUN <5 (L) 6 - 20  mg/dL   Creatinine, Ser 0.60 0.44 - 1.00 mg/dL   Calcium 8.6 (L) 8.9 - 10.3 mg/dL   GFR calc non Af Amer >60 >60 mL/min   GFR calc Af Amer >60 >60 mL/min    Comment: (NOTE) The eGFR has been calculated using the CKD EPI  equation. This calculation has not been validated in all clinical situations. eGFR's persistently <60 mL/min signify possible Chronic Kidney Disease.    Anion gap 8 5 - 15    Comment: Performed at [facility], [address] [place], [place] [address]   No results found.  Assessment:  Joanna Hall is a 52 y.o. female with stage IV cervical cancer currently day 9 s/p cycle #3 carboplatin and Taxol.  She presented with nausea, vomiting, diarrhea, and abdominal pain.    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  EGD on 07/30/2018 revealed erythematous mucosa in the cardia, bilious gastric fluid, and LA Grade A reflux esophagitis.  Duodenal biopsies pending.  Colonoscopy on 07/30/2018 revealed a fair prep and normal appearing mucosa.  Random biopsies obtained.  Smptomatically, she has increased abdominal pain, nausea, and diarrhea.  Exam is stable.  WBC 3100 (ANC 1600).  Platelets 66,000 (stable).  Plan:   1.  Oncology:             Day 9 s/p cycle #3 carboplatin and Taxol with Neulasta support.             At baseline, she has loose stools.  She has had pelvic radiation in the distant past.             Taxol induced enterocolitis < 1% and ischemic colitis < 1%.  Nausea and vomiting 52%; diarrhea 38%.             Carboplatin induced vomiting 65-80%, abdominal pain 17%, diarrhea 6%.             Unclear if chemotherapy causing GI issues. 2.  Hematology:              Patient received Neulasta with chemotherapy.             Patient on Xarelto.  Counts stable.  Daily CBC with diff. 3.  Gastroenterology:             Colitis.  Etiology unclear.             Stool for C diff and GI panel by PCR negative.             Empiric ciprofloxacin and Flagyl discontinued.             Patient receiving Lomotil and Bentyl.  Ondansetron for nausea.  Colonoscopy and EGD appear fairly unremarkable.  Await pathology.  Tincture of opium recommended by GI for diarrhea.  Discuss with GI.  4.  Fluids, electrolytes and nutrition:             Magnesium 1.3 today.  IVF and electrolyte supplementation.             Daily BMP and Mg. 5.  Pan and toxicology:             Norco 5/325 1-2 tablets q 4 hours prn.  Dilaudid 1 mg every 6 hours prn.   Lequita Asal, MD  07/31/2018, 12:17 PM

## 2018-08-01 ENCOUNTER — Ambulatory Visit: Payer: Medicaid Other | Admitting: Family Medicine

## 2018-08-01 LAB — CBC WITH DIFFERENTIAL/PLATELET
Abs Immature Granulocytes: 0.23 10*3/uL — ABNORMAL HIGH (ref 0.00–0.07)
Basophils Absolute: 0 10*3/uL (ref 0.0–0.1)
Basophils Relative: 1 %
Eosinophils Absolute: 0.1 10*3/uL (ref 0.0–0.5)
Eosinophils Relative: 3 %
HCT: 26.2 % — ABNORMAL LOW (ref 36.0–46.0)
Hemoglobin: 8.6 g/dL — ABNORMAL LOW (ref 12.0–15.0)
Immature Granulocytes: 6 %
Lymphocytes Relative: 22 %
Lymphs Abs: 0.8 10*3/uL (ref 0.7–4.0)
MCH: 33.3 pg (ref 26.0–34.0)
MCHC: 32.8 g/dL (ref 30.0–36.0)
MCV: 101.6 fL — ABNORMAL HIGH (ref 80.0–100.0)
Monocytes Absolute: 0.4 10*3/uL (ref 0.1–1.0)
Monocytes Relative: 12 %
Neutro Abs: 2.1 10*3/uL (ref 1.7–7.7)
Neutrophils Relative %: 56 %
Platelets: 68 10*3/uL — ABNORMAL LOW (ref 150–400)
RBC: 2.58 MIL/uL — ABNORMAL LOW (ref 3.87–5.11)
RDW: 15.5 % (ref 11.5–15.5)
WBC Morphology: ABNORMAL
WBC: 3.7 10*3/uL — ABNORMAL LOW (ref 4.0–10.5)
nRBC: 0.5 % — ABNORMAL HIGH (ref 0.0–0.2)

## 2018-08-01 LAB — BASIC METABOLIC PANEL
ANION GAP: 7 (ref 5–15)
BUN: 6 mg/dL (ref 6–20)
CALCIUM: 8.4 mg/dL — AB (ref 8.9–10.3)
CHLORIDE: 105 mmol/L (ref 98–111)
CO2: 26 mmol/L (ref 22–32)
Creatinine, Ser: 0.59 mg/dL (ref 0.44–1.00)
GFR calc non Af Amer: >60 mL/min (ref >60)
Glucose, Bld: 94 mg/dL (ref 70–99)
Potassium: 3.9 mmol/L (ref 3.5–5.1)
SODIUM: 138 mmol/L (ref 135–145)

## 2018-08-01 LAB — MAGNESIUM: Magnesium: 1.7 mg/dL (ref 1.7–2.4)

## 2018-08-01 LAB — SURGICAL PATHOLOGY

## 2018-08-01 MED ORDER — COLESTIPOL HCL 1 G PO TABS: 2.0000 g | ORAL_TABLET | Freq: Three times a day (TID) | ORAL | 0 refills | Status: DC

## 2018-08-01 MED ORDER — MAGNESIUM SULFATE 4 GM/100ML IV SOLN
4.0000 g | Freq: Once | INTRAVENOUS | Status: AC
  Administered 2018-08-01: 4 g via INTRAVENOUS
  Filled 2018-08-01: qty 100

## 2018-08-01 MED ORDER — MULTI-VITAMIN/MINERALS PO TABS: 1.0000 | ORAL_TABLET | Freq: Every day | ORAL | 0 refills | Status: AC

## 2018-08-01 MED ORDER — OPIUM 10 MG/ML (1%) PO TINC: 1.0000 mL | Freq: Three times a day (TID) | ORAL | 0 refills | Status: DC

## 2018-08-01 MED ORDER — HYDROCODONE-ACETAMINOPHEN 5-325 MG PO TABS: 1.0000 | ORAL_TABLET | Freq: Four times a day (QID) | ORAL | 0 refills | Status: DC | PRN

## 2018-08-01 MED ORDER — OLANZAPINE 5 MG PO TBDP: 5.0000 mg | ORAL_TABLET | Freq: Every day | ORAL | 0 refills | Status: DC

## 2018-08-01 MED ORDER — HEPARIN SOD (PORK) LOCK FLUSH 100 UNIT/ML IV SOLN
500.0000 [IU] | Freq: Once | INTRAVENOUS | Status: DC
  Filled 2018-08-01: qty 5

## 2018-08-01 MED ORDER — PANTOPRAZOLE SODIUM 40 MG PO TBEC: 40.0000 mg | DELAYED_RELEASE_TABLET | Freq: Every day | ORAL | 0 refills | Status: DC

## 2018-08-01 NOTE — Discharge Summary (Signed)
[facility] NAME: Joanna Hall    MR#:  [id number]  DATE OF BIRTH:  1966-06-16  DATE OF ADMISSION:  07/27/2018 ADMITTING PHYSICIAN: Demetrios Loll, MD  DATE OF DISCHARGE: 08/01/2018 11:25 AM  PRIMARY CARE PHYSICIAN: Arnetha Courser, MD    ADMISSION DIAGNOSIS:  Colitis [K52.9] Nausea vomiting and diarrhea [R11.2, R19.7] Abdominal pain, unspecified abdominal location [R10.9]  DISCHARGE DIAGNOSIS:  Diarrhea likely chemo induced  SECONDARY DIAGNOSIS:   Past Medical History:  Diagnosis Date  . Abnormal cervical Papanicolaou smear 09/18/2017  . Anxiety   . Aortic atherosclerosis ([place])   . Arthritis    neck and knees  . 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 Blood transfusion without reported diagnosis   . Cervical cancer (HCC)    mets lung  . Chronic anal fissure   . Chronic diarrhea   . Dyspnea   . Factor V Leiden mutation ([place])   . Fecal incontinence   . Genital warts   . GERD (gastroesophageal reflux disease)   . Heart murmur   . Hemorrhoids   . 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 History of cancer chemotherapy    completed 06/2017  . History of Clostridium difficile infection    while undergoing chemo.  Negative test 10/2017  . 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 Intestinal infection due to Clostridium difficile 09/18/2017  . Macrocytic anemia with vitamin B12 deficiency   . Perianal condylomata   . Pneumonia    History of  . Pulmonary nodules   . Rectal bleeding   . Small bowel obstruction ([place]) 08/2017  . Stiff neck    limited right turn  . Vitamin D deficiency     HOSPITAL COURSE:   1.  Diarrhea.  Stool studies were negative.  Antibiotics were discontinued.  The patient continued to have diarrhea so GI proceeded with a colonoscopy.  Colon mucosa was normal.  The radiologist read read the CT scan of the abdomen as colitis which is sometimes  over read on CT scans.  I do not believe this was a colitis.  I think this was diarrhea secondary to chemotherapy.  The patient was given numerous medications including colestipol dicyclomine Lomotil Zyprexa opium tincture.  Patient did not have diarrhea yesterday and the day of discharge and wanted to go home. 2.  Hypomagnesemia replaced IV be prior to going home.  I do not recommend oral magnesium this can also cause diarrhea. 3.  Hypokalemia replaced during the hospital course and IV fluids 4.  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  Follow-up with oncology as outpatient 6.  Pancytopenia.  Platelet count dipped down to 66.  Continue to monitor as outpatient  DISCHARGE CONDITIONS:   Satisfactory  CONSULTS OBTAINED:  Treatment Team:  Lequita Asal, MD Lucilla Lame, MD  DRUG ALLERGIES:   Allergies  Allergen Reactions  . Ketamine Anxiety and Other (See Comments)    Syncope episode/confusion     DISCHARGE MEDICATIONS:   Allergies as of 08/01/2018      Reactions   Ketamine Anxiety, Other (See Comments)   Syncope episode/confusion      Medication List    STOP taking these medications   fluticasone 50 MCG/ACT nasal spray Commonly known as:  FLONASE  ibuprofen 600 MG tablet Commonly known as:  ADVIL,MOTRIN   traMADol 50 MG tablet Commonly known as:  ULTRAM     TAKE these medications   CALCIUM 500 +D 500-400 MG-UNIT Tabs Generic drug:  Calcium Carb-Cholecalciferol Take 2 tablets by mouth daily.   cholestyramine 4 g packet Commonly known as:  QUESTRAN Take 4 g by mouth 3 (three) times daily as needed.   colestipol 1 g tablet Commonly known as:  COLESTID Take 2 tablets (2 g total) by mouth 3 (three) times daily with meals. What changed:  when to take this   cyanocobalamin 1000 MCG/ML injection Commonly known as:  (VITAMIN B-12) Inject 1ML every week x 4 weeks, then 1ML every other week  x 2 months, then monthly x 3 months What changed:    how much to take  how to take this  when to take this   dicyclomine 20 MG tablet Commonly known as:  BENTYL Take 1 tablet (20 mg total) by mouth every 6 (six) hours. What changed:  additional instructions   diphenoxylate-atropine 2.5-0.025 MG tablet Commonly known as:  LOMOTIL Take 2 tablets by mouth 4 (four) times daily.   HYDROcodone-acetaminophen 5-325 MG tablet Commonly known as:  NORCO/VICODIN Take 1 tablet by mouth every 6 (six) hours as needed for moderate pain.   loratadine 10 MG tablet Commonly known as:  CLARITIN Take 10 mg by mouth daily.   multivitamin with minerals tablet Take 1 tablet by mouth daily.   OLANZapine zydis 5 MG disintegrating tablet Commonly known as:  ZYPREXA Take 1 tablet (5 mg total) by mouth at bedtime.   ondansetron 8 MG tablet Commonly known as:  ZOFRAN Take 1 tablet (8 mg total) by mouth every 8 (eight) hours as needed for nausea or vomiting.   Opium 10 MG/ML (1%) Tinc Take 1 mL (10 mg total) by mouth 3 (three) times daily with meals.   pantoprazole 40 MG tablet Commonly known as:  PROTONIX Take 1 tablet (40 mg total) by mouth daily.   promethazine 25 MG tablet Commonly known as:  PHENERGAN Take 1 tablet (25 mg total) by mouth every 6 (six) hours as needed for nausea or vomiting.   rivaroxaban 20 MG Tabs tablet Commonly known as:  XARELTO Take 1 tablet (20 mg total) by mouth daily with supper.   sertraline 50 MG tablet Commonly known as:  ZOLOFT Take 1 tablet (50 mg total) by mouth daily.   SYRINGE 3CC/22GX1" 22G X 1" 3 ML Misc For use with Vitamin B12 injections   traZODone 50 MG tablet Commonly known as:  DESYREL Take 0.5-1 tablets (25-50 mg total) by mouth at bedtime as needed for sleep.   VENTOLIN HFA 108 (90 Base) MCG/ACT inhaler Generic drug:  albuterol Inhale 1-2 puffs into the lungs every 4 (four) hours as needed for shortness of breath.         DISCHARGE INSTRUCTIONS:   Follow-up PMD 5 days Follow-up oncology as scheduled  If you experience worsening of your admission symptoms, develop shortness of breath, life threatening emergency, suicidal or homicidal thoughts you must seek medical attention immediately by calling 911 or calling your MD immediately  if symptoms less severe.  You Must read complete instructions/literature along with all the possible adverse reactions/side effects for all the Medicines you take and that have been prescribed to you. Take any new Medicines after you have completely understood and accept all the possible adverse reactions/side effects.   Please note  You were cared  for by a hospitalist during your hospital stay. If you have any questions about your discharge medications or the care you received while you were in the hospital after you are discharged, you can call the unit and asked to speak with the hospitalist on call if the hospitalist that took care of you is not available. Once you are discharged, your primary care physician will handle any further medical issues. Please note that NO REFILLS for any discharge medications will be authorized once you are discharged, as it is imperative that you return to your primary care physician (or establish a relationship with a primary care physician if you do not have one) for your aftercare needs so that they can reassess your need for medications and monitor your lab values.    Today   CHIEF COMPLAINT:   Chief Complaint  Patient presents with  . Emesis  . Diarrhea    HISTORY OF PRESENT ILLNESS:  Joanna Hall  is a 52 y.o. female presented with vomiting and diarrhea   VITAL SIGNS:  Blood pressure 116/76, pulse 73, temperature 98.1 F (36.7 C), temperature source Oral, resp. rate 16, height 5' 7"  (1.702 m), weight 72.6 kg, SpO2 97 %.    PHYSICAL EXAMINATION:  GENERAL:  52 y.o.-year-old patient lying in the bed with no acute distress.   EYES: Pupils equal, round, reactive to light and accommodation. No scleral icterus. Extraocular muscles intact.  HEENT: Head atraumatic, normocephalic. Oropharynx and nasopharynx clear.  NECK:  Supple, no jugular venous distention. No thyroid enlargement, no tenderness.  LUNGS: Normal breath sounds bilaterally, no wheezing, rales,rhonchi or crepitation. No use of accessory muscles of respiration.  CARDIOVASCULAR: S1, S2 normal. No murmurs, rubs, or gallops.  ABDOMEN: Soft, slight lower abdominal tenderness, non-distended. Bowel sounds present. No organomegaly or mass.  EXTREMITIES: No pedal edema, cyanosis, or clubbing.  NEUROLOGIC: Cranial nerves II through XII are intact. Muscle strength 5/5 in all extremities. Sensation intact. Gait not checked.  PSYCHIATRIC: The patient is alert and oriented x 3.  SKIN: No obvious rash, lesion, or ulcer.   DATA REVIEW:   CBC Recent Labs  Lab 08/01/18 0528  WBC 3.7*  HGB 8.6*  HCT 26.2*  PLT 68*    Chemistries  Recent Labs  Lab 07/27/18 1042  08/01/18 0528  NA 137   < > 138  K 3.9   < > 3.9  CL 103   < > 105  CO2 25   < > 26  GLUCOSE 155*   < > 94  BUN 11   < > 6  CREATININE 0.53   < > 0.59  CALCIUM 9.4   < > 8.4*  MG 1.5*   < > 1.7  AST 40  --   --   ALT 58*  --   --   ALKPHOS 153*  --   --   BILITOT 0.9  --   --    < > = values in this interval not displayed.    Cardiac Enzymes Recent Labs  Lab 07/27/18 1042  TROPONINI <0.03    Microbiology Results  Results for orders placed or performed during the hospital encounter of 07/27/18  C difficile quick scan w PCR reflex     Status: None   Collection Time: 07/28/18  4:56 AM  Result Value Ref Range Status   C Diff antigen NEGATIVE NEGATIVE Final   C Diff toxin NEGATIVE NEGATIVE Final   C Diff interpretation No C. difficile detected.  Final  Comment: Performed at [facility], [address] [place], [place] [address]  Gastrointestinal Panel by PCR , Stool      Status: None   Collection Time: 07/28/18  1:44 PM  Result Value Ref Range Status   Campylobacter species NOT DETECTED NOT DETECTED Final   Plesimonas shigelloides NOT DETECTED NOT DETECTED Final   Salmonella species NOT DETECTED NOT DETECTED Final   Yersinia enterocolitica NOT DETECTED NOT DETECTED Final   Vibrio species NOT DETECTED NOT DETECTED Final   Vibrio cholerae NOT DETECTED NOT DETECTED Final   Enteroaggregative E coli (EAEC) NOT DETECTED NOT DETECTED Final   Enteropathogenic E coli (EPEC) NOT DETECTED NOT DETECTED Final   Enterotoxigenic E coli (ETEC) NOT DETECTED NOT DETECTED Final   Shiga like toxin producing E coli (STEC) NOT DETECTED NOT DETECTED Final   Shigella/Enteroinvasive E coli (EIEC) NOT DETECTED NOT DETECTED Final   Cryptosporidium NOT DETECTED NOT DETECTED Final   Cyclospora cayetanensis NOT DETECTED NOT DETECTED Final   Entamoeba histolytica NOT DETECTED NOT DETECTED Final   Giardia lamblia NOT DETECTED NOT DETECTED Final   Adenovirus F40/41 NOT DETECTED NOT DETECTED Final   Astrovirus NOT DETECTED NOT DETECTED Final   Norovirus GI/GII NOT DETECTED NOT DETECTED Final   Rotavirus A NOT DETECTED NOT DETECTED Final   Sapovirus (I, II, IV, and V) NOT DETECTED NOT DETECTED Final    Comment: Performed at [facility], [address] [place], [place] [address]     Management plans discussed with the patient, and she is in agreement.  CODE STATUS:  Code Status History    Date Active Date Inactive Code Status Order ID Comments User Context   07/27/2018 1621 08/01/2018 1432 Full Code [id number]  Demetrios Loll, MD Inpatient   06/10/2018 1754 06/12/2018 1614 Full Code [id number]  Dustin Flock, MD Inpatient   04/24/2018 1451 04/27/2018 1700 Full Code [id number]  Lovell Sheehan, MD Inpatient   12/03/2017 1619 12/08/2017 1606 Full Code [id number]  Epifanio Lesches, MD ED   09/12/2017 0024 09/15/2017 1721 Full Code [id number]  [name], MD ED   08/27/2017 0223  09/05/2017 1355 Full Code [id number]  Herbert Pun, MD Inpatient    Advance Directive Documentation     Most Recent Value  Type of Advance Directive  Healthcare Power of [place], Living will  Pre-existing out of facility DNR order (yellow form or pink MOST form)  -  "MOST" Form in Place?  -      TOTAL TIME TAKING CARE OF THIS PATIENT: 34 minutes.    Loletha Grayer M.D on 08/01/2018 at 4:50 PM  Between 7am to 6pm - Pager - [phone]  After 6pm go to [email or web address] - password [email or web address] [facility]  [phone]  CC: Primary care physician; Arnetha Courser, MD

## 2018-08-01 NOTE — Care Management (Addendum)
Patient is for discharge today. No discharge needs identified by members of the care team

## 2018-08-02 ENCOUNTER — Other Ambulatory Visit: Payer: Self-pay | Admitting: Urgent Care

## 2018-08-02 ENCOUNTER — Inpatient Hospital Stay: Payer: Medicaid Other

## 2018-08-02 VITALS — BP 144/84 | HR 83 | Temp 96.1°F | Resp 18

## 2018-08-02 DIAGNOSIS — R7989 Other specified abnormal findings of blood chemistry: Secondary | ICD-10-CM | POA: Diagnosis not present

## 2018-08-02 DIAGNOSIS — K219 Gastro-esophageal reflux disease without esophagitis: Secondary | ICD-10-CM | POA: Diagnosis not present

## 2018-08-02 DIAGNOSIS — Z96652 Presence of left artificial knee joint: Secondary | ICD-10-CM | POA: Diagnosis not present

## 2018-08-02 DIAGNOSIS — E871 Hypo-osmolality and hyponatremia: Secondary | ICD-10-CM | POA: Diagnosis not present

## 2018-08-02 DIAGNOSIS — T451X5A Adverse effect of antineoplastic and immunosuppressive drugs, initial encounter: Secondary | ICD-10-CM | POA: Diagnosis not present

## 2018-08-02 DIAGNOSIS — C7802 Secondary malignant neoplasm of left lung: Secondary | ICD-10-CM | POA: Diagnosis not present

## 2018-08-02 DIAGNOSIS — C7800 Secondary malignant neoplasm of unspecified lung: Secondary | ICD-10-CM

## 2018-08-02 DIAGNOSIS — D6959 Other secondary thrombocytopenia: Secondary | ICD-10-CM | POA: Diagnosis not present

## 2018-08-02 DIAGNOSIS — Z79899 Other long term (current) drug therapy: Secondary | ICD-10-CM | POA: Diagnosis not present

## 2018-08-02 DIAGNOSIS — A0472 Enterocolitis due to Clostridium difficile, not specified as recurrent: Secondary | ICD-10-CM | POA: Diagnosis not present

## 2018-08-02 DIAGNOSIS — R197 Diarrhea, unspecified: Secondary | ICD-10-CM | POA: Diagnosis not present

## 2018-08-02 DIAGNOSIS — Z923 Personal history of irradiation: Secondary | ICD-10-CM | POA: Diagnosis not present

## 2018-08-02 DIAGNOSIS — E538 Deficiency of other specified B group vitamins: Secondary | ICD-10-CM | POA: Diagnosis not present

## 2018-08-02 DIAGNOSIS — C538 Malignant neoplasm of overlapping sites of cervix uteri: Secondary | ICD-10-CM | POA: Diagnosis not present

## 2018-08-02 DIAGNOSIS — Z87891 Personal history of nicotine dependence: Secondary | ICD-10-CM | POA: Diagnosis not present

## 2018-08-02 DIAGNOSIS — Z5111 Encounter for antineoplastic chemotherapy: Secondary | ICD-10-CM | POA: Diagnosis present

## 2018-08-02 DIAGNOSIS — E860 Dehydration: Secondary | ICD-10-CM | POA: Diagnosis not present

## 2018-08-02 LAB — CBC WITH DIFFERENTIAL/PLATELET
Abs Immature Granulocytes: 0.39 10*3/uL — ABNORMAL HIGH (ref 0.00–0.07)
Basophils Absolute: 0 10*3/uL (ref 0.0–0.1)
Basophils Relative: 1 %
Eosinophils Absolute: 0.1 10*3/uL (ref 0.0–0.5)
Eosinophils Relative: 1 %
HCT: 27.9 % — ABNORMAL LOW (ref 36.0–46.0)
Hemoglobin: 9.3 g/dL — ABNORMAL LOW (ref 12.0–15.0)
Immature Granulocytes: 6 %
Lymphocytes Relative: 16 %
Lymphs Abs: 1 10*3/uL (ref 0.7–4.0)
MCH: 32.9 pg (ref 26.0–34.0)
MCHC: 33.3 g/dL (ref 30.0–36.0)
MCV: 98.6 fL (ref 80.0–100.0)
Monocytes Absolute: 0.4 10*3/uL (ref 0.1–1.0)
Monocytes Relative: 7 %
Neutro Abs: 4.4 10*3/uL (ref 1.7–7.7)
Neutrophils Relative %: 69 %
Platelets: 78 10*3/uL — ABNORMAL LOW (ref 150–400)
RBC: 2.83 MIL/uL — ABNORMAL LOW (ref 3.87–5.11)
RDW: 15.5 % (ref 11.5–15.5)
WBC: 6.4 10*3/uL (ref 4.0–10.5)
nRBC: 0 % (ref 0.0–0.2)

## 2018-08-02 LAB — COMPREHENSIVE METABOLIC PANEL
ALT: 35 U/L (ref 0–44)
AST: 34 U/L (ref 15–41)
Albumin: 3.7 g/dL (ref 3.5–5.0)
Alkaline Phosphatase: 144 U/L — ABNORMAL HIGH (ref 38–126)
Anion gap: 8 (ref 5–15)
BUN: 10 mg/dL (ref 6–20)
CO2: 26 mmol/L (ref 22–32)
Calcium: 9.2 mg/dL (ref 8.9–10.3)
Chloride: 104 mmol/L (ref 98–111)
Creatinine, Ser: 0.63 mg/dL (ref 0.44–1.00)
GFR calc Af Amer: >60 mL/min (ref >60)
GFR calc non Af Amer: >60 mL/min (ref >60)
Glucose, Bld: 98 mg/dL (ref 70–99)
Potassium: 3.8 mmol/L (ref 3.5–5.1)
Sodium: 138 mmol/L (ref 135–145)
Total Bilirubin: 0.4 mg/dL (ref 0.3–1.2)
Total Protein: 6.9 g/dL (ref 6.5–8.1)

## 2018-08-02 LAB — MAGNESIUM: Magnesium: 1.4 mg/dL — ABNORMAL LOW (ref 1.7–2.4)

## 2018-08-02 MED ORDER — SODIUM CHLORIDE 0.9 % IV SOLN: 3.0000 g | Freq: Once | INTRAVENOUS | Status: DC

## 2018-08-02 MED ORDER — HEPARIN SOD (PORK) LOCK FLUSH 100 UNIT/ML IV SOLN
500.0000 [IU] | Freq: Once | INTRAVENOUS | Status: AC
  Administered 2018-08-02: 500 [IU] via INTRAVENOUS
  Filled 2018-08-02: qty 5

## 2018-08-02 MED ORDER — MAGNESIUM SULFATE 4 GM/100ML IV SOLN
4.0000 g | Freq: Once | INTRAVENOUS | Status: AC
  Administered 2018-08-02: 4 g via INTRAVENOUS
  Filled 2018-08-02: qty 100

## 2018-08-02 MED ORDER — SODIUM CHLORIDE 0.9% FLUSH
10.0000 mL | Freq: Once | INTRAVENOUS | Status: DC | PRN
  Filled 2018-08-02: qty 10

## 2018-08-02 MED ORDER — SODIUM CHLORIDE 0.9 % IV SOLN
INTRAVENOUS | Status: DC
  Administered 2018-08-02: 10:00:00 via INTRAVENOUS
  Filled 2018-08-02: qty 250

## 2018-08-02 NOTE — Progress Notes (Signed)
Magnesium 1.4, Platelets 78, potassium 3.8, pt reports some mild fatigue and mild dizziness. Honor Loh NP aware.

## 2018-08-05 ENCOUNTER — Encounter: Payer: Self-pay | Admitting: Hematology and Oncology

## 2018-08-06 ENCOUNTER — Inpatient Hospital Stay (HOSPITAL_BASED_OUTPATIENT_CLINIC_OR_DEPARTMENT_OTHER): Payer: Medicaid Other | Admitting: Oncology

## 2018-08-06 ENCOUNTER — Other Ambulatory Visit: Payer: Self-pay

## 2018-08-06 ENCOUNTER — Telehealth: Payer: Self-pay | Admitting: *Deleted

## 2018-08-06 ENCOUNTER — Encounter: Payer: Self-pay | Admitting: Hematology and Oncology

## 2018-08-06 ENCOUNTER — Telehealth: Payer: Self-pay | Admitting: Internal Medicine

## 2018-08-06 ENCOUNTER — Inpatient Hospital Stay: Payer: Medicaid Other

## 2018-08-06 ENCOUNTER — Other Ambulatory Visit: Payer: Self-pay | Admitting: *Deleted

## 2018-08-06 VITALS — BP 120/84 | HR 86 | Temp 96.5°F | Resp 20

## 2018-08-06 DIAGNOSIS — D6959 Other secondary thrombocytopenia: Secondary | ICD-10-CM

## 2018-08-06 DIAGNOSIS — A0472 Enterocolitis due to Clostridium difficile, not specified as recurrent: Secondary | ICD-10-CM

## 2018-08-06 DIAGNOSIS — E871 Hypo-osmolality and hyponatremia: Secondary | ICD-10-CM

## 2018-08-06 DIAGNOSIS — Z5111 Encounter for antineoplastic chemotherapy: Secondary | ICD-10-CM | POA: Diagnosis not present

## 2018-08-06 DIAGNOSIS — Z87891 Personal history of nicotine dependence: Secondary | ICD-10-CM

## 2018-08-06 DIAGNOSIS — R197 Diarrhea, unspecified: Secondary | ICD-10-CM

## 2018-08-06 DIAGNOSIS — C7800 Secondary malignant neoplasm of unspecified lung: Secondary | ICD-10-CM

## 2018-08-06 DIAGNOSIS — Z95828 Presence of other vascular implants and grafts: Secondary | ICD-10-CM

## 2018-08-06 DIAGNOSIS — R531 Weakness: Secondary | ICD-10-CM

## 2018-08-06 DIAGNOSIS — R7989 Other specified abnormal findings of blood chemistry: Secondary | ICD-10-CM

## 2018-08-06 DIAGNOSIS — C538 Malignant neoplasm of overlapping sites of cervix uteri: Secondary | ICD-10-CM

## 2018-08-06 DIAGNOSIS — T451X5A Adverse effect of antineoplastic and immunosuppressive drugs, initial encounter: Secondary | ICD-10-CM

## 2018-08-06 DIAGNOSIS — C7802 Secondary malignant neoplasm of left lung: Secondary | ICD-10-CM | POA: Diagnosis not present

## 2018-08-06 DIAGNOSIS — Z923 Personal history of irradiation: Secondary | ICD-10-CM

## 2018-08-06 DIAGNOSIS — E538 Deficiency of other specified B group vitamins: Secondary | ICD-10-CM

## 2018-08-06 DIAGNOSIS — Z79899 Other long term (current) drug therapy: Secondary | ICD-10-CM

## 2018-08-06 DIAGNOSIS — Z5189 Encounter for other specified aftercare: Secondary | ICD-10-CM

## 2018-08-06 DIAGNOSIS — E860 Dehydration: Secondary | ICD-10-CM

## 2018-08-06 DIAGNOSIS — R112 Nausea with vomiting, unspecified: Secondary | ICD-10-CM

## 2018-08-06 DIAGNOSIS — C799 Secondary malignant neoplasm of unspecified site: Secondary | ICD-10-CM

## 2018-08-06 DIAGNOSIS — C530 Malignant neoplasm of endocervix: Secondary | ICD-10-CM

## 2018-08-06 LAB — COMPREHENSIVE METABOLIC PANEL
ALK PHOS: 141 U/L — AB (ref 38–126)
ALT: 31 U/L (ref 0–44)
ANION GAP: 7 (ref 5–15)
AST: 30 U/L (ref 15–41)
Albumin: 3.8 g/dL (ref 3.5–5.0)
BILIRUBIN TOTAL: 0.4 mg/dL (ref 0.3–1.2)
BUN: 11 mg/dL (ref 6–20)
CALCIUM: 9.3 mg/dL (ref 8.9–10.3)
CO2: 24 mmol/L (ref 22–32)
Chloride: 106 mmol/L (ref 98–111)
Creatinine, Ser: 0.73 mg/dL (ref 0.44–1.00)
Glucose, Bld: 105 mg/dL — ABNORMAL HIGH (ref 70–99)
Potassium: 3.9 mmol/L (ref 3.5–5.1)
SODIUM: 137 mmol/L (ref 135–145)
TOTAL PROTEIN: 7.2 g/dL (ref 6.5–8.1)

## 2018-08-06 LAB — CBC WITH DIFFERENTIAL/PLATELET
Abs Immature Granulocytes: 0.06 10*3/uL (ref 0.00–0.07)
Basophils Absolute: 0 10*3/uL (ref 0.0–0.1)
Basophils Relative: 0 %
EOS PCT: 1 %
Eosinophils Absolute: 0 10*3/uL (ref 0.0–0.5)
HCT: 28.7 % — ABNORMAL LOW (ref 36.0–46.0)
HEMOGLOBIN: 9.4 g/dL — AB (ref 12.0–15.0)
Immature Granulocytes: 1 %
LYMPHS ABS: 0.6 10*3/uL — AB (ref 0.7–4.0)
Lymphocytes Relative: 12 %
MCH: 33.2 pg (ref 26.0–34.0)
MCHC: 32.8 g/dL (ref 30.0–36.0)
MCV: 101.4 fL — ABNORMAL HIGH (ref 80.0–100.0)
MONOS PCT: 5 %
Monocytes Absolute: 0.3 10*3/uL (ref 0.1–1.0)
NEUTROS ABS: 4.3 10*3/uL (ref 1.7–7.7)
NEUTROS PCT: 81 %
NRBC: 0 % (ref 0.0–0.2)
Platelets: 103 10*3/uL — ABNORMAL LOW (ref 150–400)
RBC: 2.83 MIL/uL — AB (ref 3.87–5.11)
RDW: 16.3 % — ABNORMAL HIGH (ref 11.5–15.5)
WBC: 5.4 10*3/uL (ref 4.0–10.5)

## 2018-08-06 LAB — MAGNESIUM: MAGNESIUM: 1.5 mg/dL — AB (ref 1.7–2.4)

## 2018-08-06 MED ORDER — SODIUM CHLORIDE 0.9 % IV SOLN: 4.0000 g | Freq: Once | INTRAVENOUS | Status: DC

## 2018-08-06 MED ORDER — ONDANSETRON HCL 4 MG/2ML IJ SOLN
4.0000 mg | Freq: Once | INTRAMUSCULAR | Status: AC
  Administered 2018-08-06: 4 mg via INTRAVENOUS

## 2018-08-06 MED ORDER — ONDANSETRON HCL 4 MG/2ML IJ SOLN
INTRAMUSCULAR | Status: AC
  Filled 2018-08-06: qty 4

## 2018-08-06 MED ORDER — SODIUM CHLORIDE 0.9% FLUSH
10.0000 mL | INTRAVENOUS | Status: DC | PRN
  Administered 2018-08-06: 10 mL via INTRAVENOUS
  Filled 2018-08-06: qty 10

## 2018-08-06 MED ORDER — HEPARIN SOD (PORK) LOCK FLUSH 100 UNIT/ML IV SOLN
500.0000 [IU] | Freq: Once | INTRAVENOUS | Status: AC
  Administered 2018-08-06: 500 [IU] via INTRAVENOUS

## 2018-08-06 MED ORDER — MAGNESIUM SULFATE 4 GM/100ML IV SOLN
4.0000 g | Freq: Once | INTRAVENOUS | Status: AC
  Administered 2018-08-06: 4 g via INTRAVENOUS
  Filled 2018-08-06: qty 100

## 2018-08-06 MED ORDER — SODIUM CHLORIDE 0.9 % IV SOLN
Freq: Once | INTRAVENOUS | Status: AC
  Administered 2018-08-06: 12:00:00 via INTRAVENOUS
  Filled 2018-08-06: qty 250

## 2018-08-06 NOTE — Telephone Encounter (Signed)
Patient called and states she has diarrhea and is weak, she is requesting IV fluids and possible Magnesium. Appointment for this morning added and accepted

## 2018-08-06 NOTE — Telephone Encounter (Signed)
Patient called regarding diarrhea feeling weak needing IV fluids/labs/magnesium; will send message to the team. Thx

## 2018-08-06 NOTE — Progress Notes (Signed)
Symptom Management Consult note [facility]  [phone]) [phone] [phone]  Patient Care Team: Arnetha Courser, MD as PCP - General (Family Medicine) Mellody Drown, MD as Consulting Physician (Obstetrics and Gynecology) Lenor Coffin, MD as Attending Physician (Gynecology) Lequita Asal, MD as Medical Oncologist (Hematology and Oncology) [name], MD as Consulting Physician (Gastroenterology) Michael Boston, MD as Consulting Physician (General Surgery) Lovell Sheehan, MD as Consulting Physician (Orthopedic Surgery) Clent Jacks, RN as Registered Nurse   Name of the patient: Joanna Hall  [id number]  10-27-1967   Date of visit: 08/06/2018  Diagnosis: Ovarian cancer  Chief Complaint: Diarrhea with nausea and intermittent vomiting  Current Treatment: s/p cycle 3 carbo/Taxol.  Last given on 07/23/2018.   Oncology History: Last evaluated by Dr. Mike Gip on 07/23/2018 for assessment prior to cycle 3 carbo/Taxol.  She was noted to be feeling a lot better.  She continued to have diarrhea. They proceeded with cycle 3 of dose reduced carbo/Taxol.  Require Neulasta support given declining white count.  LFTs continue to improve.  She continued on Questran and Lomotil for diarrhea.   In the interim, patient was admitted to the hospital from 07/27/2018 to 08/01/2018 for colitis, nausea and vomiting and abdominal pain.  Had full work-up of diarrhea which were negative for infection.  Antibiotics were discontinued.  Had colonoscopy which revealed normal colon mucosa.  CT scan was read as colitis but hospitalist did not agree and thinks this was secondary to chemotherapy.  Received electrolyte replacement with magnesium and potassium.   She was seen in infusion on 08/02/2018 (day after discharge) for IV fluids and 4 g magnesium. Reports a having a good weekend with intermittent sot stools. Diarrhea developed Sunday evening.   Called  to [facility] on 08/06/18 with complaints of diarrhea, weakness and feeling dehydrated.  She was instructed to come to clinic for evaluation.   Oncology History   Patient was diagnosed with cervical adenocarcinoma in [place] in 09/2016.  She has had a long-standing history of abnormal Pap smears.  She presented with an ovarian cyst.  Laparoscopic surgery was pursued but was difficult due to scar tissue.  Had BSO and rupture of cyst with purulent drainage into the abdomen.  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  Radical hysterectomy was aborted.  She was treated by Dr. Christene Slates at [facility] in [place], [place].  Decision was made to pursue concurrent chemotherapy (weekly cisplatin) and radiation.  She received treatment from 11/2016-05/2017.  01/2017 cisplatin x 2 and carboplatin x 1 (01/29/2017) due to ARF and XRT.  XRT was followed by T & O on 02/01/2017 and T & N 02/10/2017 and 02/20/2017.  Course was complicated by 80 pound weight loss, nausea, vomiting, electrolyte wasting (potassium and magnesium).  She describes that.  Is been sick constantly requiring at least 20 hospitalizations.  Follow-up CT chest and PET on 06/2017. Per patient, 'radiation worked' and no disease in the abdomen.  At that time she was noted to have lung nodules that were growing and follow-up imaging was scheduled for 10/2017.  She was admitted to hospital in [place] for small bowel obstruction which was managed conservatively and she was home for a week prior to traveling to [place] for Thanksgiving holiday where she has family.  She presented to ER in [place] on 08/2017 with nausea, vomiting, and lower abdominal pain.  Symptoms did not respond to conservative treatment.  CT on 08/26/2017 revealed small bowel obstruction with transition in the pelvis just superior to the uterus rather was a long segment of distal ileum with fatty wall thickening compatible with  chronic inflammation and/or radiation enteritis. Imaging showed numerous pulmonary nodules consistent with metastatic disease. She underwent laparotomy and right ileocolectomy on 08/31/2017 at [facility].  Surgical findings revealed a thickened, matted, and scarred piece of distal small bowel close to the ileocecal valve.  She was discharged on 09/05/2017.  Pain markedly increased in intensity and imaging was performed on 09/11/2017 which revealed: Debris within the anterior abdominal wall incision concerning for infection versus packing material, s/p post ileo-colectomy with expected postoperative changes, mild colonic ileus, numerous pulmonary nodules highly concerning for metastatic disease, punctate nonobstructing nephrolithiasis.  Staples were removed and one was packed.  She was started on doxycycline.  Abdominal and pelvic CT without contrast on 09/11/2017 revealed debris within anterior abdominal wall incision concerning for infection, versus packing material.She is s/pileocolectomy with expected postoperative changes and mild colonic ileus. There were numerous pulmonary nodules highly concerning for metastatic diseaseand punctate nonobstructing nephrolithiasis. She was readmitted on 09/12/2017. She describes the onset of lower abdominal pain on 09/09/2017. Pain markedly increased in intensity on 09/11/2017.  Staples were removed and the wound packed. She was started on doxycycline.  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  PET scan was ordered to evaluate enlarging lung nodules with concern for recurrent cervical  cancer but scan was delayed due to insurance and need to be performed in [place].  Presented to ER on 12/03/2017 for abdominal pain and emesis.  Imaging concerning for worsening possible uterine infarct and she was admitted to hospital.  Pelvic MRI was unremarkable.  Remote scarring type changes of uterus thought to be possibly related to radiation.  She was discharged on 12/08/2017.  Underwent endoscopy and colonoscopy on 12/20/2017.    On 02/22/2018 she underwent laser ablation of condylomata around the anus and vagina under anesthesia with Dr. Johney Maine.   04/15/2018: Chest, abdomen, and pelvis CTrevealed innumerable (>100) cavitary nodules scattered in the lungs, moderately enlarging compared to the 11/08/2017 PET-CT, suspicious for metastatic disease. One index node in the RLL measures 1.0 x 1.1 cm (previously 0.6 x 0.6 cm). There were no new nodules. There was an ill-defined wall thickening in the rectosigmoid with surrounding stranding along fascia planes, probably sequela from prior radiation therapy. There was multilevel lumbar impingement due to spondylosis and degenerative disc disease. There was heterogeneous enhancement in the uterus (some possibly from prior radiation therapy).  04/23/2018:PET scan revealednumerous scattered solid and cavitary nodules in the lungs stable increased in size compared to the prior PET-CT from 11/08/2017.Largest nodule was 1.1 cm in the LUL (SUV 1.9).These demonstratedlow-grade metabolic activity up to a maximum SUV of about 2.3, increased from 11/08/2017.   Case was discussed at tumor board on 04/25/2018. Consensus to pursue CT-guided biopsy (05/06/18) which revealed: Metastatic adenocarcinoma, morphologically consistent with cervical adenocarcinoma.  She has history of chronic hepatitis C which is managed by GI.  Hepatitis C genotype is 2a/2c.  She receives B12 injections for history of B12 deficiency.  On 04/24/2018 she underwent left total  knee  replacement with Dr. [name].     Malignant neoplasm of overlapping sites of cervix ([place])   09/18/2017 Initial Diagnosis    Malignant neoplasm of overlapping sites of cervix ([place])    05/15/2018 -  Chemotherapy    The patient had palonosetron (ALOXI) injection 0.25 mg, 0.25 mg, Intravenous,  Once, 3 of 6 cycles Administration: 0.25 mg (06/04/2018), 0.25 mg (06/25/2018), 0.25 mg (07/23/2018) pegfilgrastim (NEULASTA) injection 6 mg, 6 mg, Subcutaneous, Once, 3 of 6 cycles Administration: 6 mg (06/06/2018), 6 mg (06/26/2018), 6 mg (07/24/2018) CARBOplatin (PARAPLATIN) 500 mg in sodium chloride 0.9 % 250 mL chemo infusion, 500 mg (100 % of original dose 497.2 mg), Intravenous,  Once, 3 of 6 cycles Dose modification:   (original dose 497.2 mg, Cycle 1, Reason: Provider Judgment, Comment: difficulty with counts with initial chemo in [place]; advance dose as tolerated),   (original dose 497.2 mg, Cycle 5, Reason: Provider Judgment, Comment: return back to original dose) Administration: 500 mg (06/04/2018), 560 mg (06/25/2018), 500 mg (07/23/2018) PACLitaxel (TAXOL) 264 mg in sodium chloride 0.9 % 250 mL chemo infusion (> 17m/m2), 140 mg/m2 = 264 mg (100 % of original dose 140 mg/m2), Intravenous,  Once, 3 of 6 cycles Dose modification: 140 mg/m2 (original dose 140 mg/m2, Cycle 1, Reason: Provider Judgment, Comment: difficulty with counts with [place] chemo [place] advance dose as tolerated), 155 mg/m2 (original dose 140 mg/m2, Cycle 2, Reason: Provider Judgment, Comment: advance as tolerated), 155 mg/m2 (original dose 175 mg/m2, Cycle 5, Reason: Provider Judgment, Comment: continue current dose) Administration: 264 mg (06/04/2018), 294 mg (06/25/2018), 294 mg (07/23/2018)  for chemotherapy treatment.      Subjective Data:  Subjective:     KAUBRI GATHRIGHTis a 52y.o. female who presents for evaluation of chronic nausea, vomiting and diarrhea.  Intermittently requiring fluids for dehydration.   This is a chronic long-standing problem d/t history of bowel obstruction with ileocolectomy and more recently  chemotherapy with carbo/Taxol.  Noted improvement in stools over the weekend.  Consistently having 3-4 formed stools daily.  Beginning on Sunday evening noted an increase in stools and stool consistency turned from formed to liquid.  Dr. VMarius Ditch GI physician recently changed prophylactic antidiarrheals prior to discharge from the hospital.  She is taking the medications as prescribed without relief.  Thinks she may need some fluids and electrolyte replacement.  Associated signs & symptoms:  mild abdominal pain, diarrhea occurring mainly at night and ability to keep down some fluids  The following portions of the patient's history were reviewed and updated as appropriate: allergies, current medications, past family history, past medical history, past social history, past surgical history and problem list. Review of Systems A comprehensive review of systems was negative except for: Constitutional: positive for fatigue Gastrointestinal: positive for abdominal pain, diarrhea, nausea and vomiting Musculoskeletal: positive for muscle weakness       Objective:    BP 120/84 (BP Location: Right Arm, Patient Position: Sitting) Comment: standing bp 100/64  Pulse 86 Comment: standing pulse 103  Temp (!) 96.5 F (35.8 C) (Tympanic)   Resp 20  General appearance: alert, fatigued and no distress Lungs: clear to auscultation bilaterally Heart: regular rate and rhythm, S1, S2 normal, no murmur, click, rub or gallop Abdomen: abnormal findings:  hyperactive bowel sounds Extremities: extremities normal, atraumatic, no cyanosis or edema Neurologic: Grossly normal    Assessment:     Dehydration d/t chronic diarrhea s/p hemicolectomy back in 2018 and current chemo regimen.  Plan:    Antiemetics per medication orders. IV fluids per orders. Labs per orders.    Ovarian cancer: s/p 3 cycles of  carbo/Taxol.  Last given on 07/23/2018.  Has not tolerated chemotherapy well.  Has experienced significant diarrhea and abdominal pain resulting in two hospital admissions.  Has been seen and evaluated by GI Dr. Marius Ditch on several occasions most recently on 07/09/2018 for chronic unexplained diarrhea.  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  While inpatient, had colonoscopy and EGD revealing normal mucosa.  Dr. Marius Ditch recommended a lactose-free diet, increase colestipol to 2 g 3 times daily, increase Lomotil to 2 pills 3 times daily, add tincture of opium 1%.  1 mL every 8 hours and to start Zyprexa 5 mg at bedtime. This appears to be controlling diarrhea any bowel movements to 3-4 daily versus previously greater than 20 watery bowel movements daily.  Diarrhea appears to be worse a few days after chemotherapy and resolves after a few days.  Requiring IV fluids several times per week with electrolyte replacement.  She is scheduled to return to clinic on 08/13/2018 for consideration of cycle 4 carbo/Taxol, labs and assessment by Dr. Mike Gip.   Dehydration/hypomagnesemia: d/t chronic diarrhea.  Currently taking medications as prescribed by Dr. Marius Ditch and mentioned above.  Has not started Zyprexa because she was uncertain of what this was for.  Educated patient that Zyprexa is not an as needed medication but a daily medication for nausea.  Will give 1 L NaCl and 4 g magnesium in clinic today.   Diarrhea: She is afebrile.  There is no concern for infectious process.  Previously ruled out on several occasions.  Most recently while inpatient.  Likely d/t chemotherapy and previous ileocolectomy.  Nausea with vomiting: Encouraged her to begin Zyprexa as prescribed by Dr. Marius Ditch at discharge from hospital.  She is to take this every night whether or not she is nauseated.  She then can take her Compazine, Zofran or Phenergan as needed for breakthrough  nausea.  Thrombocytopenia: d/t chemotherapy.  Improving.  103,000.   Plan Stat labs.  Hypomagnesemia with magnesium of 1.5.  This appears to be her baseline.  Anemia.  Hemoglobin 9.4.  Stable. Vital signs: She is orthostatic.  1 liter NaCl, 4 g magnesium, 8 mg Zofran and 10 mg Decadron. Will recheck VS after fluids. Plan to have her return to clinic on Friday for labs with possible IV fluids +/- magnesium.   Spoke at length with patient about symptom control.  She is very tearful.  States after 2 more cycles of chemotherapy the plan is to reimage and if the disease burden is not improving, she does not wish for any more treatment.  States she is tired. She is a good candidate for palliative care and I will arrange for that at future appointments.   Greater than 50% was spent in counseling and coordination of care with this patient including but not limited to discussion of the relevant topics above (See A&P) including, but not limited to diagnosis and management of acute and chronic medical conditions.   Faythe Casa, NP 08/06/2018 1:48 PM

## 2018-08-07 ENCOUNTER — Other Ambulatory Visit: Payer: Self-pay | Admitting: *Deleted

## 2018-08-07 ENCOUNTER — Other Ambulatory Visit: Payer: Self-pay | Admitting: Gastroenterology

## 2018-08-07 DIAGNOSIS — R857 Abnormal histological findings in specimens from digestive organs and abdominal cavity: Secondary | ICD-10-CM

## 2018-08-07 DIAGNOSIS — K529 Noninfective gastroenteritis and colitis, unspecified: Secondary | ICD-10-CM

## 2018-08-07 DIAGNOSIS — K6389 Other specified diseases of intestine: Principal | ICD-10-CM

## 2018-08-07 DIAGNOSIS — Q8789 Other specified congenital malformation syndromes, not elsewhere classified: Principal | ICD-10-CM

## 2018-08-07 MED ORDER — BUDESONIDE 3 MG PO CPEP: 9.0000 mg | ORAL_CAPSULE | Freq: Every day | ORAL | 2 refills | Status: AC

## 2018-08-07 MED ORDER — HYDROCODONE-ACETAMINOPHEN 5-325 MG PO TABS: 1.0000 | ORAL_TABLET | Freq: Four times a day (QID) | ORAL | 0 refills | Status: DC | PRN

## 2018-08-09 ENCOUNTER — Emergency Department
Admission: EM | Admit: 2018-08-09 | Discharge: 2018-08-09 | Disposition: A | Discharge status: Home / Self Care | Payer: Medicaid Other | Attending: Emergency Medicine | Admitting: Emergency Medicine

## 2018-08-09 ENCOUNTER — Inpatient Hospital Stay: Payer: Medicaid Other

## 2018-08-09 DIAGNOSIS — Z87891 Personal history of nicotine dependence: Secondary | ICD-10-CM | POA: Diagnosis not present

## 2018-08-09 DIAGNOSIS — C530 Malignant neoplasm of endocervix: Secondary | ICD-10-CM

## 2018-08-09 DIAGNOSIS — C799 Secondary malignant neoplasm of unspecified site: Secondary | ICD-10-CM

## 2018-08-09 DIAGNOSIS — K0889 Other specified disorders of teeth and supporting structures: Secondary | ICD-10-CM | POA: Insufficient documentation

## 2018-08-09 DIAGNOSIS — I10 Essential (primary) hypertension: Secondary | ICD-10-CM | POA: Diagnosis not present

## 2018-08-09 DIAGNOSIS — Z5111 Encounter for antineoplastic chemotherapy: Secondary | ICD-10-CM | POA: Diagnosis not present

## 2018-08-09 DIAGNOSIS — Z79899 Other long term (current) drug therapy: Secondary | ICD-10-CM | POA: Insufficient documentation

## 2018-08-09 LAB — CBC WITH DIFFERENTIAL/PLATELET
Abs Immature Granulocytes: 0.02 10*3/uL (ref 0.00–0.07)
BASOS PCT: 0 %
Basophils Absolute: 0 10*3/uL (ref 0.0–0.1)
EOS ABS: 0 10*3/uL (ref 0.0–0.5)
EOS PCT: 0 %
HCT: 30 % — ABNORMAL LOW (ref 36.0–46.0)
Hemoglobin: 9.7 g/dL — ABNORMAL LOW (ref 12.0–15.0)
Immature Granulocytes: 0 %
Lymphocytes Relative: 14 %
Lymphs Abs: 0.8 10*3/uL (ref 0.7–4.0)
MCH: 33 pg (ref 26.0–34.0)
MCHC: 32.3 g/dL (ref 30.0–36.0)
MCV: 102 fL — AB (ref 80.0–100.0)
MONO ABS: 0.3 10*3/uL (ref 0.1–1.0)
MONOS PCT: 5 %
NEUTROS PCT: 81 %
Neutro Abs: 4.6 10*3/uL (ref 1.7–7.7)
Platelets: 128 10*3/uL — ABNORMAL LOW (ref 150–400)
RBC: 2.94 MIL/uL — ABNORMAL LOW (ref 3.87–5.11)
RDW: 16.5 % — AB (ref 11.5–15.5)
WBC: 5.7 10*3/uL (ref 4.0–10.5)
nRBC: 0 % (ref 0.0–0.2)

## 2018-08-09 LAB — MAGNESIUM: MAGNESIUM: 1.4 mg/dL — AB (ref 1.7–2.4)

## 2018-08-09 LAB — COMPREHENSIVE METABOLIC PANEL
ALT: 33 U/L (ref 0–44)
AST: 29 U/L (ref 15–41)
Albumin: 3.9 g/dL (ref 3.5–5.0)
Alkaline Phosphatase: 162 U/L — ABNORMAL HIGH (ref 38–126)
Anion gap: 7 (ref 5–15)
BUN: 12 mg/dL (ref 6–20)
CO2: 26 mmol/L (ref 22–32)
CREATININE: 0.68 mg/dL (ref 0.44–1.00)
Calcium: 9.3 mg/dL (ref 8.9–10.3)
Chloride: 105 mmol/L (ref 98–111)
Glucose, Bld: 104 mg/dL — ABNORMAL HIGH (ref 70–99)
POTASSIUM: 3.8 mmol/L (ref 3.5–5.1)
Sodium: 138 mmol/L (ref 135–145)
TOTAL PROTEIN: 7.8 g/dL (ref 6.5–8.1)
Total Bilirubin: 0.5 mg/dL (ref 0.3–1.2)

## 2018-08-09 MED ORDER — MAGNESIUM SULFATE 4 GM/100ML IV SOLN
4.0000 g | Freq: Once | INTRAVENOUS | Status: AC
  Administered 2018-08-09: 4 g via INTRAVENOUS
  Filled 2018-08-09: qty 100

## 2018-08-09 MED ORDER — AMOXICILLIN-POT CLAVULANATE 875-125 MG PO TABS
1.0000 | ORAL_TABLET | Freq: Once | ORAL | Status: AC
  Administered 2018-08-09: 1 via ORAL
  Filled 2018-08-09: qty 1

## 2018-08-09 MED ORDER — HYDROCODONE-ACETAMINOPHEN 5-325 MG PO TABS
1.0000 | ORAL_TABLET | Freq: Once | ORAL | Status: AC
  Administered 2018-08-09: 1 via ORAL
  Filled 2018-08-09: qty 1

## 2018-08-09 MED ORDER — SODIUM CHLORIDE 0.9 % IV SOLN: 3.0000 g | Freq: Once | INTRAVENOUS | Status: DC

## 2018-08-09 MED ORDER — AMOXICILLIN-POT CLAVULANATE 875-125 MG PO TABS: 1.0000 | ORAL_TABLET | Freq: Two times a day (BID) | ORAL | 0 refills | Status: DC

## 2018-08-09 MED ORDER — SODIUM CHLORIDE 0.9 % IV SOLN
INTRAVENOUS | Status: DC
  Administered 2018-08-09: 11:00:00 via INTRAVENOUS
  Filled 2018-08-09: qty 250

## 2018-08-09 MED ORDER — HEPARIN SOD (PORK) LOCK FLUSH 100 UNIT/ML IV SOLN
500.0000 [IU] | Freq: Once | INTRAVENOUS | Status: AC
  Administered 2018-08-09: 500 [IU] via INTRAVENOUS
  Filled 2018-08-09: qty 5

## 2018-08-09 MED ORDER — MORPHINE SULFATE 2 MG/ML IJ SOLN
1.0000 mg | Freq: Once | INTRAMUSCULAR | Status: AC
  Administered 2018-08-09: 1 mg via INTRAVENOUS
  Filled 2018-08-09: qty 1

## 2018-08-09 NOTE — ED Notes (Signed)
No orders at this per MD Rifenbark

## 2018-08-09 NOTE — ED Provider Notes (Signed)
[facility] Emergency Department Provider Note ____________________________________________   First MD Initiated Contact with Patient 08/09/18 [phone]     (approximate)  I have reviewed the triage vital signs and the nursing notes.   HISTORY  Chief Complaint Dental Pain    HPI Joanna Hall is a 52 y.o. female with PMH as noted below including cervical cancer for which she is on active chemotherapy who presents with right anterior lower dental pain over the last several days, gradual onset, pulsating quality, and associated mild swelling.  She is concerned for a dental infection.  She denies fever chills or other acute symptoms.  Past Medical History:  Diagnosis Date  . Abnormal cervical Papanicolaou smear 09/18/2017  . Anxiety   . Aortic atherosclerosis ([place])   . Arthritis    neck and knees  . 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 Blood transfusion without reported diagnosis   . Cervical cancer (HCC)    mets lung  . Chronic anal fissure   . Chronic diarrhea   . Dyspnea   . Factor V Leiden mutation ([place])   . Fecal incontinence   . Genital warts   . GERD (gastroesophageal reflux disease)   . Heart murmur   . Hemorrhoids   . 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 History of cancer chemotherapy    completed 06/2017  . History of Clostridium difficile infection    while undergoing chemo.  Negative test 10/2017  . 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 Intestinal infection due to Clostridium difficile 09/18/2017  . Macrocytic anemia with vitamin B12 deficiency   . Perianal condylomata   . Pneumonia    History of  . Pulmonary nodules   . Rectal bleeding   . Small bowel obstruction ([place]) 08/2017  . Stiff neck    limited right turn  . Vitamin D deficiency     Patient Active Problem List   Diagnosis Date Noted  . Nausea vomiting and diarrhea   . Pancolitis ([place]) 07/27/2018  .  Chronic diarrhea 06/25/2018  . Chronic anticoagulation 06/25/2018  . Vomiting   . Abdominal pain 06/10/2018  . Encounter for antineoplastic chemotherapy 06/04/2018  . 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 Closed fracture of distal end of radius 05/04/2018  . History of total knee arthroplasty 04/24/2018  . Osteoarthritis of left knee 02/28/2018  . Condyloma acuminatum of anus s/p ablation 02/22/2018 02/22/2018  . Condyloma acuminatum of vagina s/p ablation 02/22/2018 02/22/2018  . Positive ANA (antinuclear antibody) 02/04/2018  . Aortic atherosclerosis ([place]) 01/15/2018  . Elevated MCV 01/15/2018  . Anemia 01/15/2018  . Genital warts 01/15/2018  . Vitamin D deficiency 01/15/2018  . Pernicious anemia   . Impingement syndrome of shoulder region 12/19/2017  . Multiple joint pain 12/19/2017  . Osteoarthritis of right knee 12/19/2017  . B12 deficiency 12/11/2017  . Lung nodules 12/11/2017  . History of Clostridium difficile infection 10/23/2017  . Factor V Leiden ([place]) 10/19/2017  . Goals of care, counseling/discussion 10/19/2017  . Cervical cancer, FIGO stage IB1 ([place]) 09/18/2017  . Chronic hepatitis C without hepatic coma ([place]) 09/18/2017  . Cytopenia 09/18/2017  . Diarrhea 09/18/2017  . Erosive gastropathy 09/18/2017  . Lower abdominal pain 09/18/2017  . 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 Malignant neoplasm of overlapping sites of cervix ([place]) 09/18/2017  . Renal  insufficiency 09/18/2017  . Wound infection after surgery 09/12/2017  . Hypokalemia   . Hypomagnesemia   . Ileocolic anastomotic leak   . Cervical arthritis 07/18/2017  . Dysuria 06/20/2017  . Metastatic cancer ([place]) 05/12/2017  . Hepatitis, chronic ([place]) 05/05/2017  . Essential hypertension 03/15/2017  . Anemia in other chronic diseases classified elsewhere 03/01/2017  . Chemotherapy-induced neutropenia ([place]) 01/29/2017  . 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 CHOLECYSTECTOMY    . COLON SURGERY  08/2017   resection  . COLONOSCOPY WITH PROPOFOL N/A 12/20/2017   Procedure: COLONOSCOPY WITH PROPOFOL;  Surgeon: [name], MD;  Location: [facility] ENDOSCOPY;  Service: Gastroenterology;  Laterality: N/A;  . COLONOSCOPY WITH PROPOFOL N/A 07/30/2018   Procedure: COLONOSCOPY WITH PROPOFOL;  Surgeon: [name], MD;  Location: [facility] ENDOSCOPY;  Service: Gastroenterology;  Laterality: N/A;  . DIAGNOSTIC LAPAROSCOPY    . ESOPHAGOGASTRODUODENOSCOPY (EGD) WITH PROPOFOL N/A 12/20/2017   Procedure: ESOPHAGOGASTRODUODENOSCOPY (EGD) WITH PROPOFOL;  Surgeon: [name], MD;  Location: [place];  Service: Gastroenterology;  Laterality: N/A;  . ESOPHAGOGASTRODUODENOSCOPY (EGD) WITH PROPOFOL  07/30/2018   Procedure: ESOPHAGOGASTRODUODENOSCOPY (EGD) WITH PROPOFOL;  Surgeon: [name], MD;  Location: [facility] ENDOSCOPY;  Service: Gastroenterology;;  . LAPAROTOMY N/A 08/31/2017   Procedure: EXPLORATORY LAPAROTOMY for SBO, ileocolectomy, removal of piece of uterine wall;  Surgeon: [name], MD;  Location: [facility] ORS;  Service: General;  Laterality: N/A;  . LASER ABLATION CONDOLAMATA N/A 02/22/2018   Procedure: LASER ABLATION/REMOVAL OF VZCHYIFOYDX AROUND ANUS AND VAGINA;  Surgeon: Michael Boston, MD;  Location: [place];  Service: General;  Laterality: N/A;  . OOPHORECTOMY    . PORTA CATH INSERTION N/A 05/13/2018   Procedure: PORTA CATH INSERTION;  Surgeon: Katha Cabal, MD;  Location: [place] CV LAB;  Service: Cardiovascular;  Laterality: N/A;  . SMALL INTESTINE SURGERY    . TANDEM RING INSERTION     x3  . THORACOTOMY    . TOTAL KNEE ARTHROPLASTY Left 04/24/2018   Procedure: TOTAL KNEE ARTHROPLASTY;  Surgeon: Lovell Sheehan, MD;  Location: [facility] ORS;  Service: Orthopedics;  Laterality: Left;    Prior to Admission medications   Medication Sig Start  Date End Date Taking? Authorizing Provider  budesonide (ENTOCORT EC) 3 MG 24 hr capsule Take 3 capsules (9 mg total) by mouth daily. 08/07/18 09/06/18  [name], MD  Calcium Carb-Cholecalciferol (CALCIUM 500 +D) 500-400 MG-UNIT TABS Take 2 tablets by mouth daily.    [provider]  cholestyramine (QUESTRAN) 4 g packet Take 4 g by mouth 3 (three) times daily as needed.    [provider]  colestipol (COLESTID) 1 g tablet Take 2 tablets (2 g total) by mouth 3 (three) times daily with meals. 08/01/18   Loletha Grayer, MD  cyanocobalamin (,VITAMIN B-12,) 1000 MCG/ML injection Inject 1ML every week x 4 weeks, then 1ML every other week x 2 months, then monthly x 3 months Patient taking differently: Inject 1,000 mcg into the muscle every 30 (thirty) days. Inject 1ML every week x 4 weeks, then 1ML every other week x 2 months, then monthly x 3 months 11/29/17   [name], MD  dicyclomine (BENTYL) 20 MG tablet Take 1 tablet (20 mg total) by mouth every 6 (six) hours. Patient taking differently: Take 20 mg by mouth every 6 (six) hours. Taking as needed 05/23/18   Jacquelin Hawking, NP  HYDROcodone-acetaminophen (  NORCO/VICODIN) 5-325 MG tablet Take 1 tablet by mouth every 6 (six) hours as needed for moderate pain. 08/07/18   Karen Kitchens, NP  loratadine (CLARITIN) 10 MG tablet Take 10 mg by mouth daily.    [provider]  Multiple Vitamins-Minerals (MULTIVITAMIN WITH MINERALS) tablet Take 1 tablet by mouth daily. 08/01/18 08/01/19  Loletha Grayer, MD  OLANZapine zydis (ZYPREXA) 5 MG disintegrating tablet Take 1 tablet (5 mg total) by mouth at bedtime. 08/01/18   Loletha Grayer, MD  ondansetron (ZOFRAN) 8 MG tablet Take 1 tablet (8 mg total) by mouth every 8 (eight) hours as needed for nausea or vomiting. 07/23/18   Karen Kitchens, NP  Opium 10 MG/ML (1%) TINC Take 1 mL (10 mg total) by mouth 3 (three) times daily with meals. 08/01/18   Loletha Grayer, MD   pantoprazole (PROTONIX) 40 MG tablet Take 1 tablet (40 mg total) by mouth daily. 08/01/18   Loletha Grayer, MD  promethazine (PHENERGAN) 25 MG tablet Take 1 tablet (25 mg total) by mouth every 6 (six) hours as needed for nausea or vomiting. 07/25/18   Karen Kitchens, NP  rivaroxaban (XARELTO) 20 MG TABS tablet Take 1 tablet (20 mg total) by mouth daily with supper. 03/13/18   Lequita Asal, MD  sertraline (ZOLOFT) 50 MG tablet Take 1 tablet (50 mg total) by mouth daily. 04/02/18   Lada, Satira Anis, MD  Syringe/Needle, Disp, (SYRINGE 3CC/22GX1") 22G X 1" 3 ML MISC For use with Vitamin B12 injections 04/12/18   Vanga, Tally Due, MD  traZODone (DESYREL) 50 MG tablet Take 0.5-1 tablets (25-50 mg total) by mouth at bedtime as needed for sleep. 07/25/18   Karen Kitchens, NP  VENTOLIN HFA 108 (90 Base) MCG/ACT inhaler Inhale 1-2 puffs into the lungs every 4 (four) hours as needed for shortness of breath.     [provider]    Allergies Ketamine  Family History  Problem Relation Age of Onset  . Hypertension Father   . Diabetes Father   . Alcohol abuse Daughter   . Hypertension Maternal Grandmother   . Diabetes Maternal Grandmother   . Diabetes Paternal Grandmother   . Hypertension Paternal Grandmother     Social History Social History   Tobacco Use  . Smoking status: Former Smoker    Last attempt to quit: 10/16/2006    Years since quitting: 11.8  . Smokeless tobacco: Never Used  Substance Use Topics  . Alcohol use: Yes    Frequency: Never    Comment: seldom  . Drug use: Yes    Types: Marijuana    Review of Systems  Constitutional: No fever. Eyes: No redness. ENT: No sore throat.  Positive for dental pain. Cardiovascular: Denies chest pain. Respiratory: Denies shortness of breath. Gastrointestinal: No vomiting or diarrhea.  Genitourinary: Negative for dysuria.  Musculoskeletal: Negative for back pain. Skin: Negative for rash. Neurological: Negative for  headache.   ____________________________________________   PHYSICAL EXAM:  VITAL SIGNS: ED Triage Vitals  Enc Vitals Group     BP 08/09/18 0551 (!) 155/79     Pulse Rate 08/09/18 0551 (!) 109     Resp 08/09/18 0551 20     Temp 08/09/18 0551 98 F (36.7 C)     Temp Source 08/09/18 0551 Oral     SpO2 08/09/18 0551 98 %     Weight 08/09/18 0552 160 lb 15 oz (73 kg)     Height --      Head  Circumference --      Peak Flow --      Pain Score 08/09/18 0552 8     Pain Loc --      Pain Edu? --      Excl. in [place]? --     Constitutional: Alert and oriented.  No acute distress. Eyes: Conjunctivae are normal.  Head: Atraumatic. Nose: No congestion/rhinnorhea. Mouth/Throat: Mucous membranes are moist.  Decayed and missing teeth in right anterior lower mandibular area with mild swelling but no fluctuance or palpable abscess.  No purulent drainage. Neck: Normal range of motion.  No lymphadenopathy. Cardiovascular: Normal rate, regular rhythm.  Good peripheral circulation. Respiratory: Normal respiratory effort.  Gastrointestinal: No distention.  Musculoskeletal:  Extremities warm and well perfused.  Neurologic:  Normal speech and language. No gross focal neurologic deficits are appreciated.  Skin:  Skin is warm and dry. No rash noted. Psychiatric: Mood and affect are normal. Speech and behavior are normal.  ____________________________________________   LABS (all labs ordered are listed, but only abnormal results are displayed)  Labs Reviewed - No data to display ____________________________________________  EKG   ____________________________________________  RADIOLOGY    ____________________________________________   PROCEDURES  Procedure(s) performed: No  Procedures  Critical Care performed: No ____________________________________________   INITIAL IMPRESSION / ASSESSMENT AND PLAN / ED COURSE  Pertinent labs & imaging results that were available during my  care of the patient were reviewed by me and considered in my medical decision making (see chart for details).  52 year old female currently being treated for cancer presents with right anterior lower dental pain over the last few days.  She denies any systemic symptoms.  On exam the patient was tachycardic at triage but this has resolved.  Her other vital signs are normal.  She is comfortable appearing.  She has poor dentition in the relevant area and possibly mild swelling to the gums but no other acute findings.  I reviewed the past medical records in Epic and confirmed her prior history.  Also reviewed labs from 3 days ago which were unremarkable except for chronic low platelets.  Presentation is consistent with early/mild dental infection.  The patient has contacted several dentists offices and is not able to get an appointment least for another few days.  I will start the patient on Augmentin and an analgesic.  Return precautions given, and she expresses understanding.  ____________________________________________   FINAL CLINICAL IMPRESSION(S) / ED DIAGNOSES  Final diagnoses:  Pain, dental      NEW MEDICATIONS STARTED DURING THIS VISIT:  New Prescriptions   No medications on file     Note:  This document was prepared using Dragon voice recognition software and may include unintentional dictation errors.    Arta Silence, MD 08/09/18 [phone]

## 2018-08-09 NOTE — ED Triage Notes (Signed)
Patient c/o right upper dental pain beginning yesterday. Broken teeth noted to area. Patient reports swelling right upper jaw.   Patient reports hx of cervical cancer with metastases to lung. Patient is on chemo every 3 weeks, last dose 2 weeks ago. patient is scheduled for magnesium and fluid infusion at cancer center today.   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

## 2018-08-09 NOTE — Discharge Instructions (Addendum)
Return to the ER for new, worsening, persistent pain, fevers, swelling, or any other new or worsening symptoms that concern you.  Follow-up with a dentist within the next week.

## 2018-08-09 NOTE — ED Notes (Signed)
Pt stated that she thinks that she might have a tooth infection. Pt has some swelling noted to that area. Denies any fever or chills. Pt is a cancer patient who receives chemo

## 2018-08-13 ENCOUNTER — Inpatient Hospital Stay: Payer: Medicaid Other

## 2018-08-13 ENCOUNTER — Encounter: Payer: Self-pay | Admitting: Hematology and Oncology

## 2018-08-13 ENCOUNTER — Inpatient Hospital Stay (HOSPITAL_BASED_OUTPATIENT_CLINIC_OR_DEPARTMENT_OTHER): Payer: Medicaid Other | Admitting: Hematology and Oncology

## 2018-08-13 VITALS — BP 118/70 | HR 112 | Temp 96.1°F | Resp 18 | Wt 167.1 lb

## 2018-08-13 DIAGNOSIS — R197 Diarrhea, unspecified: Secondary | ICD-10-CM | POA: Diagnosis not present

## 2018-08-13 DIAGNOSIS — D6851 Activated protein C resistance: Secondary | ICD-10-CM

## 2018-08-13 DIAGNOSIS — E876 Hypokalemia: Secondary | ICD-10-CM

## 2018-08-13 DIAGNOSIS — R110 Nausea: Secondary | ICD-10-CM

## 2018-08-13 DIAGNOSIS — E538 Deficiency of other specified B group vitamins: Secondary | ICD-10-CM

## 2018-08-13 DIAGNOSIS — C538 Malignant neoplasm of overlapping sites of cervix uteri: Secondary | ICD-10-CM | POA: Diagnosis not present

## 2018-08-13 DIAGNOSIS — C7800 Secondary malignant neoplasm of unspecified lung: Secondary | ICD-10-CM

## 2018-08-13 DIAGNOSIS — Z7189 Other specified counseling: Secondary | ICD-10-CM

## 2018-08-13 DIAGNOSIS — E860 Dehydration: Secondary | ICD-10-CM

## 2018-08-13 DIAGNOSIS — Z7901 Long term (current) use of anticoagulants: Secondary | ICD-10-CM

## 2018-08-13 DIAGNOSIS — Z5111 Encounter for antineoplastic chemotherapy: Secondary | ICD-10-CM | POA: Diagnosis not present

## 2018-08-13 LAB — CBC WITH DIFFERENTIAL/PLATELET
Abs Immature Granulocytes: 0.01 10*3/uL (ref 0.00–0.07)
Basophils Absolute: 0 10*3/uL (ref 0.0–0.1)
Basophils Relative: 1 %
Eosinophils Absolute: 0 10*3/uL (ref 0.0–0.5)
Eosinophils Relative: 1 %
HCT: 26.9 % — ABNORMAL LOW (ref 36.0–46.0)
Hemoglobin: 8.7 g/dL — ABNORMAL LOW (ref 12.0–15.0)
Immature Granulocytes: 0 %
Lymphocytes Relative: 20 %
Lymphs Abs: 0.7 10*3/uL (ref 0.7–4.0)
MCH: 33.3 pg (ref 26.0–34.0)
MCHC: 32.3 g/dL (ref 30.0–36.0)
MCV: 103.1 fL — ABNORMAL HIGH (ref 80.0–100.0)
Monocytes Absolute: 0.2 10*3/uL (ref 0.1–1.0)
Monocytes Relative: 6 %
Neutro Abs: 2.6 10*3/uL (ref 1.7–7.7)
Neutrophils Relative %: 72 %
Platelets: 147 10*3/uL — ABNORMAL LOW (ref 150–400)
RBC: 2.61 MIL/uL — ABNORMAL LOW (ref 3.87–5.11)
RDW: 16.7 % — ABNORMAL HIGH (ref 11.5–15.5)
WBC: 3.6 10*3/uL — ABNORMAL LOW (ref 4.0–10.5)
nRBC: 0 % (ref 0.0–0.2)

## 2018-08-13 LAB — COMPREHENSIVE METABOLIC PANEL
ALT: 39 U/L (ref 0–44)
AST: 39 U/L (ref 15–41)
Albumin: 3.5 g/dL (ref 3.5–5.0)
Alkaline Phosphatase: 161 U/L — ABNORMAL HIGH (ref 38–126)
Anion gap: 5 (ref 5–15)
BUN: 13 mg/dL (ref 6–20)
CO2: 23 mmol/L (ref 22–32)
Calcium: 8.9 mg/dL (ref 8.9–10.3)
Chloride: 108 mmol/L (ref 98–111)
Creatinine, Ser: 0.57 mg/dL (ref 0.44–1.00)
GFR calc Af Amer: >60 mL/min (ref >60)
GFR calc non Af Amer: >60 mL/min (ref >60)
Glucose, Bld: 120 mg/dL — ABNORMAL HIGH (ref 70–99)
Potassium: 3.3 mmol/L — ABNORMAL LOW (ref 3.5–5.1)
Sodium: 136 mmol/L (ref 135–145)
Total Bilirubin: 0.4 mg/dL (ref 0.3–1.2)
Total Protein: 7.2 g/dL (ref 6.5–8.1)

## 2018-08-13 LAB — MAGNESIUM: Magnesium: 1.5 mg/dL — ABNORMAL LOW (ref 1.7–2.4)

## 2018-08-13 MED ORDER — SODIUM CHLORIDE 0.9% FLUSH
10.0000 mL | Freq: Once | INTRAVENOUS | Status: DC
  Filled 2018-08-13: qty 10

## 2018-08-13 MED ORDER — ONDANSETRON HCL 4 MG/2ML IJ SOLN
8.0000 mg | Freq: Once | INTRAMUSCULAR | Status: AC
  Administered 2018-08-13: 8 mg via INTRAVENOUS
  Filled 2018-08-13: qty 4

## 2018-08-13 MED ORDER — HYDROCODONE-ACETAMINOPHEN 5-325 MG PO TABS: 1.0000 | ORAL_TABLET | Freq: Four times a day (QID) | ORAL | 0 refills | Status: DC | PRN

## 2018-08-13 MED ORDER — HEPARIN SOD (PORK) LOCK FLUSH 100 UNIT/ML IV SOLN
500.0000 [IU] | Freq: Once | INTRAVENOUS | Status: AC
  Administered 2018-08-13: 500 [IU] via INTRAVENOUS
  Filled 2018-08-13: qty 5

## 2018-08-13 MED ORDER — SODIUM CHLORIDE 0.9 % IV SOLN
Freq: Once | INTRAVENOUS | Status: AC
  Administered 2018-08-13: 11:00:00 via INTRAVENOUS
  Filled 2018-08-13: qty 1000

## 2018-08-13 MED ORDER — AMOXICILLIN 500 MG PO CAPS: 500.0000 mg | ORAL_CAPSULE | Freq: Three times a day (TID) | ORAL | 0 refills | Status: AC

## 2018-08-13 MED ORDER — TRAZODONE HCL 50 MG PO TABS: 25.0000 mg | ORAL_TABLET | Freq: Every evening | ORAL | 0 refills | Status: DC | PRN

## 2018-08-13 NOTE — Progress Notes (Signed)
Patient requesting refill for Trazadone.  States she has exertional SOB. Continues to have 6/10 pain in abdominal area.  Appetite about 75% normal.

## 2018-08-13 NOTE — Progress Notes (Signed)
[facility] day:  08/13/2018   Chief Complaint: Joanna Hall is a 52 y.o. female with stage IV cervical cancer who is seen for reassessment prior to cycle #4 carboplatin and Taxol.  HPI:  The patient was last seen in the medical oncology clinic on 07/23/2018.  At that time, she was feeling better. She continued to have diarrhea, but noted improvement. Abdominal pain had improved. No further vomiting. No fevers. Appetite was "so so"; weight was up 5 pounds. Exam was stable. WBC was 2600 (ANC 1800). AST 31, ALT 37, ALP 144, and total bilirubin 0.5.  She received cycle #3 carboplatin and Taxol with Neulasta support.  She was admitted to [facility] from 07/27/2018 - 08/01/2018 with colitiis.  She presented with nausea, vomiting, and a tender abdomen.  Bowel movements were watery.  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  EGD on 07/30/2018 revealed erythematous mucosa in the cardia, bilious gastric fluid, and LA Grade A reflux esophagitis.  Duodenal biopsies revealed moderate duodenopathy with villous blunting.  Colonoscopy on 07/30/2018 revealed a fair prep and normal appearing mucosa.  Random biopsies revealed nonspecific crypt hyperplasia.  They were negative for active inflammation and microscopic colitis.  They were negative for dyplasia and malignancy.  She was seen by Rulon Abide, NP on 08/06/2018 for diarrhea with nausea and intermittent vomiting.  She received 1 liter NS, 8 mg ondansetron, and 10 mg Decadron.  She was seen in the [facility] ER on 08/09/2018 for dental pain.  Exam revealed decayed and missing teeth in right anterior lower mandibular area with mild swelling  but no fluctuance or palpable abscess.  She was started on Augmentin and an analgesic.  Patient presents today stating, "believe it or not, I am feeling ok today". Patient continues to have "pure black watery diarrhea" despite using Colestid, Lomotil, and tincture of opium. She is having to wear incontinence products. Despite all of the diarrhea that she has been experiencing, patient denies weakness. She has continued pain in her abdomen. Patient denies that she has experienced any B symptoms.   She is nauseated today in clinic, as she forgot to take medication prior to coming in. Nausea generally well controlled with PRN use of ondansetro, promethazine, and olanzapine. Patient advises that she maintains an adequate appetite. She is eating well. Weight today is 167 lb 1 oz (75.8 kg), which compared to her last visit to the clinic, represents a 2 pound increase.   Patient complains of pain rated 6/10 in the clinic today.   Past Medical History:  Diagnosis Date  . Abnormal cervical Papanicolaou smear 09/18/2017  . Anxiety   . Aortic atherosclerosis ([place])   . Arthritis    neck and knees  . 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 Blood transfusion without reported diagnosis   . Cervical cancer (HCC)    mets lung  . Chronic anal fissure   . Chronic diarrhea   . Dyspnea   . Factor V Leiden mutation ([place])   . Fecal incontinence   . Genital warts   . GERD (gastroesophageal reflux disease)   . Heart murmur   . Hemorrhoids   . 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 History of cancer chemotherapy    completed 06/2017  . History of Clostridium difficile infection    while undergoing chemo.  Negative test 10/2017  . 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 Intestinal infection due to Clostridium difficile 09/18/2017  . Macrocytic anemia with vitamin B12 deficiency   . Perianal condylomata   . Pneumonia    History of  . Pulmonary  nodules   . Rectal bleeding   . Small bowel obstruction ([place]) 08/2017  . Stiff neck    limited right turn  . Vitamin D deficiency     Past Surgical History:  Procedure Laterality Date  . CHOLECYSTECTOMY    . COLON SURGERY  08/2017   resection  . COLONOSCOPY WITH PROPOFOL N/A 12/20/2017   Procedure: COLONOSCOPY WITH PROPOFOL;  Surgeon: [name], MD;  Location: [facility] ENDOSCOPY;  Service: Gastroenterology;  Laterality: N/A;  . COLONOSCOPY WITH PROPOFOL N/A 07/30/2018   Procedure: COLONOSCOPY WITH PROPOFOL;  Surgeon: [name], MD;  Location: [facility] ENDOSCOPY;  Service: Gastroenterology;  Laterality: N/A;  . DIAGNOSTIC LAPAROSCOPY    . ESOPHAGOGASTRODUODENOSCOPY (EGD) WITH PROPOFOL N/A 12/20/2017   Procedure: ESOPHAGOGASTRODUODENOSCOPY (EGD) WITH PROPOFOL;  Surgeon: [name], MD;  Location: [place];  Service: Gastroenterology;  Laterality: N/A;  . ESOPHAGOGASTRODUODENOSCOPY (EGD) WITH PROPOFOL  07/30/2018   Procedure: ESOPHAGOGASTRODUODENOSCOPY (EGD) WITH PROPOFOL;  Surgeon: [name], MD;  Location: [facility] ENDOSCOPY;  Service: Gastroenterology;;  . LAPAROTOMY N/A 08/31/2017   Procedure: EXPLORATORY LAPAROTOMY for SBO, ileocolectomy, removal of piece of uterine wall;  Surgeon: [name], MD;  Location: [facility] ORS;  Service: General;  Laterality: N/A;  . LASER ABLATION CONDOLAMATA N/A 02/22/2018   Procedure: LASER ABLATION/REMOVAL OF JSHFWYOVZCH AROUND ANUS AND VAGINA;  Surgeon: Michael Boston, MD;  Location: [place];  Service: General;  Laterality: N/A;  . OOPHORECTOMY    . PORTA CATH INSERTION N/A 05/13/2018   Procedure: PORTA CATH INSERTION;  Surgeon: Katha Cabal, MD;  Location: [facility];  Service: Cardiovascular;  Laterality: N/A;  . SMALL INTESTINE SURGERY    . TANDEM RING INSERTION     x3  . THORACOTOMY    . TOTAL KNEE ARTHROPLASTY Left 04/24/2018   Procedure: TOTAL KNEE ARTHROPLASTY;  Surgeon: Lovell Sheehan, MD;  Location: [facility] ORS;  Service: Orthopedics;  Laterality: Left;    Family History  Problem Relation Age of Onset  . Hypertension Father   . Diabetes Father   . Alcohol abuse Daughter   . Hypertension Maternal Grandmother   . Diabetes Maternal Grandmother   . Diabetes Paternal Grandmother   . Hypertension Paternal Grandmother     Social History:  reports that she quit smoking about 11 years ago. She has never used smokeless tobacco. She reports that she drinks alcohol. She reports that she has current or past drug history. Drug: Marijuana.  The patient's 62 year old daughter died of alcoholism.  The patient is originally from [place].  She moved from [place] to [place]. She is alone today.   Allergies:  Allergies  Allergen Reactions  . Ketamine Anxiety and Other (See Comments)    Syncope episode/confusion     Current Medications: Current Outpatient Medications  Medication Sig Dispense Refill  . budesonide (ENTOCORT EC) 3 MG 24 hr capsule Take 3 capsules (9 mg total) by mouth daily. 90 capsule 2  . Calcium Carb-Cholecalciferol (  CALCIUM 500 +D) 500-400 MG-UNIT TABS Take 2 tablets by mouth daily.    . colestipol (COLESTID) 1 g tablet Take 2 tablets (2 g total) by mouth 3 (three) times daily with meals. 180 tablet 0  . cyanocobalamin (,VITAMIN B-12,) 1000 MCG/ML injection Inject 1ML every week x 4 weeks, then 1ML every other week x 2 months, then monthly x 3 months (Patient taking differently: Inject 1,000 mcg into the muscle every 30 (thirty) days. Inject 1ML every week x 4 weeks, then 1ML every other week x 2 months, then monthly x 3 months) 10 mL 1  . dicyclomine (BENTYL) 20 MG tablet Take 1 tablet (20 mg total) by mouth every 6 (six) hours. (Patient taking differently: Take 20 mg by mouth every 6 (six) hours. Taking as needed) 20 tablet 0  . Multiple Vitamins-Minerals (MULTIVITAMIN WITH MINERALS) tablet Take 1 tablet by mouth daily. 30 tablet 0  .  OLANZapine zydis (ZYPREXA) 5 MG disintegrating tablet Take 1 tablet (5 mg total) by mouth at bedtime. 30 tablet 0  . ondansetron (ZOFRAN) 8 MG tablet Take 1 tablet (8 mg total) by mouth every 8 (eight) hours as needed for nausea or vomiting. 30 tablet 0  . Opium 10 MG/ML (1%) TINC Take 1 mL (10 mg total) by mouth 3 (three) times daily with meals. 473 mL 0  . pantoprazole (PROTONIX) 40 MG tablet Take 1 tablet (40 mg total) by mouth daily. 30 tablet 0  . promethazine (PHENERGAN) 25 MG tablet Take 1 tablet (25 mg total) by mouth every 6 (six) hours as needed for nausea or vomiting. 30 tablet 0  . rivaroxaban (XARELTO) 20 MG TABS tablet Take 1 tablet (20 mg total) by mouth daily with supper. 30 tablet 1  . Syringe/Needle, Disp, (SYRINGE 3CC/22GX1") 22G X 1" 3 ML MISC For use with Vitamin B12 injections 50 each 0  . traZODone (DESYREL) 50 MG tablet Take 0.5-1 tablets (25-50 mg total) by mouth at bedtime as needed for sleep. 30 tablet 0  . HYDROcodone-acetaminophen (NORCO/VICODIN) 5-325 MG tablet Take 1 tablet by mouth every 6 (six) hours as needed for moderate pain. (Patient not taking: Reported on 08/13/2018) 20 tablet 0  . loratadine (CLARITIN) 10 MG tablet Take 10 mg by mouth daily.    . sertraline (ZOLOFT) 50 MG tablet Take 1 tablet (50 mg total) by mouth daily. (Patient not taking: Reported on 08/13/2018) 30 tablet 3  . VENTOLIN HFA 108 (90 Base) MCG/ACT inhaler Inhale 1-2 puffs into the lungs every 4 (four) hours as needed for shortness of breath.   0   No current facility-administered medications for this visit.    Facility-Administered Medications Ordered in Other Visits  Medication Dose Route Frequency Provider Last Rate Last Dose  . heparin lock flush 100 unit/mL  500 Units Intravenous Once Corcoran, Melissa C, MD      . heparin lock flush 100 unit/mL  500 Units Intravenous Once Corcoran, Melissa C, MD      . sodium chloride flush (NS) 0.9 % injection 10 mL  10 mL Intravenous PRN Jacquelin Hawking, NP   10 mL at 05/23/18 1225  . sodium chloride flush (NS) 0.9 % injection 10 mL  10 mL Intravenous PRN Lequita Asal, MD   10 mL at 06/10/18 0850  . sodium chloride flush (NS) 0.9 % injection 10 mL  10 mL Intravenous Once Lequita Asal, MD        Review of Systems  Constitutional: Negative for  diaphoresis, fever, malaise/fatigue and weight loss (up 2 pounds).       "Believe it or not, I am feeling okay today".  HENT: Negative.   Eyes: Negative.   Respiratory: Positive for shortness of breath (exertional). Negative for cough, hemoptysis and sputum production.   Cardiovascular: Negative for chest pain, palpitations, orthopnea, leg swelling and PND.  Gastrointestinal: Positive for abdominal pain (diffuse), diarrhea (black and watery), heartburn (on PPI) and nausea (controlled). Negative for blood in stool, constipation, melena and vomiting.  Genitourinary: Negative for dysuria, frequency, hematuria and urgency.  Musculoskeletal: Positive for joint pain (OA in shoulders) and neck pain (OA). Negative for back pain, falls and myalgias.  Skin: Negative for itching and rash.  Neurological: Negative for dizziness, tremors, weakness and headaches.  Endo/Heme/Allergies: Does not bruise/bleed easily.  Psychiatric/Behavioral: Negative for depression, memory loss and suicidal ideas. The patient is not nervous/anxious and does not have insomnia.   All other systems reviewed and are negative.  Performance status (ECOG): 1-2  Vital Signs BP 118/70 (BP Location: Left Arm, Patient Position: Sitting)   Pulse (!) 112   Temp (!) 96.1 F (35.6 C) (Tympanic)   Resp 18   Wt 167 lb 1 oz (75.8 kg)   BMI 26.17 kg/m   Physical Exam  Constitutional: She is oriented to person, place, and time. She does not have a sickly appearance.  Chronically fatigued appearing woman sitting comfortably in the exam room in no acute distress.  HENT:  Head: Normocephalic and atraumatic.  Mouth/Throat:  Oropharynx is clear and moist and mucous membranes are normal.  Sparse gray hair.  Eyes: Pupils are equal, round, and reactive to light. Conjunctivae and EOM are normal. No scleral icterus.  Neck: Normal range of motion. Neck supple. No JVD present.  Cardiovascular: Normal rate, regular rhythm, normal heart sounds and intact distal pulses. Exam reveals no gallop and no friction rub.  No murmur heard. Pulmonary/Chest: Effort normal and breath sounds normal. No respiratory distress. She has no wheezes. She has no rales.  Abdominal: Soft. Bowel sounds are normal. She exhibits no distension. There is generalized tenderness.  Musculoskeletal: Normal range of motion. She exhibits no edema or tenderness.  Lymphadenopathy:    She has no cervical adenopathy.    She has no axillary adenopathy.       Right: No inguinal and no supraclavicular adenopathy present.       Left: No inguinal and no supraclavicular adenopathy present.  Neurological: She is alert and oriented to person, place, and time.  Skin: Skin is warm and dry. No rash noted. No erythema.  Psychiatric: Mood, affect and judgment normal.  Nursing note and vitals reviewed.   Imaging studies: 06/2017:  PET scan revealed enlarging pulmonary nodules but no evidence of abdominal disease per patient report. 09/13/2017:  Abdomen and pelvic CT revealed a small infarct in the inferior pole of the LEFT kidney and RIGHT uterine infarct. 11/08/2017:  PET scan at the [facility] revealed innumerable stable bilateral cavitary pulmonary nodules (up to 8 mm), some of which demonstrate low level metabolic activity.  There were post treatment changes in the pelvis without evidence of suspicious hypermetabolic activity. 12/03/2017:  Abdomen and pelvic CT revealed continued multiple pulmonary nodules in the lung bases concerning for metastatic disease.  There was mild wall thickening of rectosigmoid colon is noted.  There was a stable 1.8  x 1.6 cm mixed fat and soft tissue density noted in right lower quadrant which may represent fat  necrosis.  There was a larger low density is noted involving uterine fundus.  12/03/2017:  Pelvic MRI was unremarkable.   04/15/2018:  Chest, abdomen, and pelvis CT revealed innumerable (> 100) cavitary nodules scattered in the lungs, moderately enlarging compared to the 11/08/2017 PET-CT, suspicious for metastatic disease.  One index node in the RLL measures 1.0 x 1.1 cm (previously 0.6 x 0.6 cm).  There were no new nodules.  There was an ill-defined wall thickening in the rectosigmoid with surrounding stranding along fascia planes, probably sequela from prior radiation therapy.  There was multilevel lumbar impingement due to spondylosis and degenerative disc disease.  There was heterogeneous enhancement in the uterus (some possibly from prior radiation therapy). 04/23/2018:  PET scan revealed numerous scattered solid and cavitary nodules in the lungs stable increased in size compared to the prior PET-CT from 11/08/2017. Largest nodule was 1.1 cm in the LUL (SUV 1.9).  These demonstrated low-grade metabolic activity up to a maximum SUV of about 2.3, increased from 11/08/2017.    Infusion on 08/13/2018  Component Date Value Ref Range Status  . Magnesium 08/13/2018 1.5* 1.7 - 2.4 mg/dL Final   Performed at [facility], [address], [place], [place] [address]  . Sodium 08/13/2018 136  135 - 145 mmol/L Final  . Potassium 08/13/2018 3.3* 3.5 - 5.1 mmol/L Final  . Chloride 08/13/2018 108  98 - 111 mmol/L Final  . CO2 08/13/2018 23  22 - 32 mmol/L Final  . Glucose, Bld 08/13/2018 120* 70 - 99 mg/dL Final  . BUN 08/13/2018 13  6 - 20 mg/dL Final  . Creatinine, Ser 08/13/2018 0.57  0.44 - 1.00 mg/dL Final  . Calcium 08/13/2018 8.9  8.9 - 10.3 mg/dL Final  . Total Protein 08/13/2018 7.2  6.5 - 8.1 g/dL Final  . Albumin 08/13/2018 3.5  3.5 - 5.0 g/dL Final  . AST 08/13/2018 39  15 - 41 U/L Final  .  ALT 08/13/2018 39  0 - 44 U/L Final  . Alkaline Phosphatase 08/13/2018 161* 38 - 126 U/L Final  . Total Bilirubin 08/13/2018 0.4  0.3 - 1.2 mg/dL Final  . GFR calc non Af Amer 08/13/2018 >60  >60 mL/min Final  . GFR calc Af Amer 08/13/2018 >60  >60 mL/min Final   Comment: (NOTE) The eGFR has been calculated using the CKD EPI equation. This calculation has not been validated in all clinical situations. eGFR's persistently <60 mL/min signify possible Chronic Kidney Disease.   Georgiann Hahn gap 08/13/2018 5  5 - 15 Final   Performed at [facility], [address] [place], [place] [address]  . WBC 08/13/2018 3.6* 4.0 - 10.5 K/uL Final  . RBC 08/13/2018 2.61* 3.87 - 5.11 MIL/uL Final  . Hemoglobin 08/13/2018 8.7* 12.0 - 15.0 g/dL Final  . HCT 08/13/2018 26.9* 36.0 - 46.0 % Final  . MCV 08/13/2018 103.1* 80.0 - 100.0 fL Final  . MCH 08/13/2018 33.3  26.0 - 34.0 pg Final  . MCHC 08/13/2018 32.3  30.0 - 36.0 g/dL Final  . RDW 08/13/2018 16.7* 11.5 - 15.5 % Final  . Platelets 08/13/2018 147* 150 - 400 K/uL Final  . nRBC 08/13/2018 0.0  0.0 - 0.2 % Final  . Neutrophils Relative % 08/13/2018 72  % Final  . Neutro Abs 08/13/2018 2.6  1.7 - 7.7 K/uL Final  . Lymphocytes Relative 08/13/2018 20  % Final  . Lymphs Abs 08/13/2018 0.7  0.7 - 4.0 K/uL Final  .  Monocytes Relative 08/13/2018 6  % Final  . Monocytes Absolute 08/13/2018 0.2  0.1 - 1.0 K/uL Final  . Eosinophils Relative 08/13/2018 1  % Final  . Eosinophils Absolute 08/13/2018 0.0  0.0 - 0.5 K/uL Final  . Basophils Relative 08/13/2018 1  % Final  . Basophils Absolute 08/13/2018 0.0  0.0 - 0.1 K/uL Final  . Immature Granulocytes 08/13/2018 0  % Final  . Abs Immature Granulocytes 08/13/2018 0.01  0.00 - 0.07 K/uL Final   Performed at [facility], [address] [place], [place] [address]    Assessment:  AIDA LEMAIRE is a 52 y.o. female with a history of stage IB1 cervical cancer s/p concurrent cisplatin and radiation  (external beam and brachytherapy) from 11/2016 - 02/2017.    She was treated by Dr. Christene Slates at [facility] in [place], [place].  In 01/2017, she received cisplatin x 2 and carboplatin x 1 (01/29/2017) due to ARF and XRT. Radiation was followed by T & O on 02/01/2017 and T & N on 02/10/2017 and 02/20/2017.  Course was complicated by weight loss (80 pounds), nausea, vomiting, electrolyte wasting (potassium and magnesium), and multiple hospitalizations.  PET scan in 06/2017 revealed enlarging pulmonary nodules but no evidence of abdominal disease per patient report.  She is s/p right ileocolectomy on 08/31/2017 for a small bowel obstruction.  She presented with abdominal pain.  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 PDL-1 testing reveals a CPS score of 15.  Chest, abdomen, and pelvis CT on 04/15/2018 revealed innumerable (> 100) cavitary nodules scattered in the lungs, moderately enlarging compared to the 11/08/2017 PET-CT, suspicious for metastatic disease. One index node in the RLL measures 1.0 x 1.1 cm.  There were no new nodules.  There was an ill-defined wall thickening in the rectosigmoid with surrounding stranding along fascia planes, probably sequela from prior radiation therapy.  There was multilevel lumbar impingement due to spondylosis and degenerative disc disease.  There was heterogeneous enhancement in the uterus (some possibly from prior radiation therapy).  PET scan on 04/23/2018 revealed numerous scattered solid and cavitary nodules in the lungs stable increased in size compared to the prior PET-CT from 11/08/2017. Largest nodule was 1.1 cm in the LUL (SUV 1.9).  These demonstrated low-grade metabolic activity up to a maximum SUV of about 2.3, increased from 11/08/2017.   CT guided biopsy of a left upper lobe  pulmonary nodule on 05/06/2018 confirmed metastatic adenocarcinoma morphologically c/w cervical adenocarcinoma.  She s/p 3 cycles of carboplatin and Taxol (06/04/2018 - 07/23/2018) with Margarette Canada support.    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  Testing was negative for prothrombin gene mutation, lupus anticoagulant panel, anticardiolipin antibodies, and beta-2 glycoprotein antibodies.  She was discharged on Xarelto.  She was admitted to [facility] from 12/03/2017 - 12/08/2017 with abdominal pain. She was not felt to have a uterine infarct.  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  The etiology was unclear.  She is hepatitis C positive.   Hepatitis C genotype is 2a/2c.  She has B12 deficiency.  B12 was 165 on 11/27/2017.  She began B12 injections on 11/29/2017.  B12 injections occur monthly at home.  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  She was diagnosed with colitis.  Abdomen and pelvic CT on 05/23/2018  revealed colonic wall thickening, consistent with colitis. Considerations include inflammatory or infectious causes. Ischemic causes would be less likely. She is s/p RIGHT hemicolectomy. She had a patent ileocolic anastomosis.  She received a 10 day course of ciprofloxacin and Flagyl.  She has  chronic diarrhea s/p right hemicolectomy.  She is Questran and Lomotil.  Symptomatically, patient notes that she is feeling "ok"  today.  She continues to have diarrhea.  She notes that her stools are "black and watery" despite multiple interventions.  Patient denies weakness.  She has diffuse abdominal pain.  Nausea well controlled with currently prescribed interventions.  Eating well; weight up 2 pounds.  Exam grossly unchanged from previous.  WBC 3600 ([place] 2600).  Hemoglobin 8.7, hematocrit 26.9, MCV 103.1, and platelets 147,000.  Potassium low at 3.3 mmol/L.  AST 39, ALT 39, ALP 161, and total bilirubin 0.4.   Magnesium is 1.5 mg/dL.  Plan:   1. Labs today:  CBC with diff, CMP, Mg. 2. Cervical cancer  Tolerating treatment fair.  Unclear if chemotherapy causing disruption of fragile GI system.  Nausea controlled.  Diarrhea persists.  Labs reviewed. Blood counts stable and adequate enough for treatment, however given significant diarrhea, will HOLD cycle #4 carboplatin + paclitaxel with Neulasta support. Discuss symptom management.  Patient has antiemetics and pain medications at home to use on a PRN basis. Patient  advising that the  prescribed interventions are adequate at this point. Continue all medications as previously prescribed.  3. Transaminitis  LFTs stable.  AST 39, ALT 39, ALT 161, and total bilirubin 0.4.  Complains of intermittent diffuse abdominal pain.  Followed by GI. 4. Nausea, vomiting, and diarrhea  Nausea and vomiting well controlled with currently prescribed interventions.  Requires PRN use of several interventions (ondansetron, promethazine, olanzapine) to control.   Nauseated in clinic today.  Will give ondansetron 8 mg IV.  Diarrhea persists despite Colestid, Lomotil, and tincture of opium.  Questran discontinued by Dr. Marius Ditch.  Stools are black and watery.  Recent testing for diarrhea of infectious etiology revealed negative results.  EGD and colonoscopy done on 07/30/2018 revealing no source of gastrointestinal bleeding.  Consider capsule study for further evaluation given her declining hemoglobin and melanotic stools. 5. Dehydration and electrolyte derangements  Labs reviewed.  BUN 13 and creatinine 0.57 mg/dL.  Potassium low at 3.3 mmol/L.  Magnesium low at 1.5 mg/dL.  Will give 1 L 0.9% NS with 20 mEq KCl and 4 g IV  Mg today in clinic. 6. B12 deficiency  Continues on monthly parenteral B12 supplementation.  Injections are given at home by patient's sister. 7. RTC in 1 week for MD assessment, labs (CBC with diff, CMP, Mg), and cycle #4 paclitaxel +  carboplatin with Neulasta support   Honor Loh, NP  08/13/2018, 10:19 AM   I saw and evaluated the patient, participating in the key portions of the service and reviewing pertinent diagnostic studies and records.  I reviewed the nurse practitioner's note and agree with the findings and the plan.  The assessment and plan were discussed with the patient.  Multiple questions were asked by the patient and answered.   Nolon Stalls, MD 08/13/2018,10:19 AM

## 2018-08-14 ENCOUNTER — Encounter: Payer: Self-pay | Admitting: Hematology and Oncology

## 2018-08-14 ENCOUNTER — Inpatient Hospital Stay: Payer: Medicaid Other | Admitting: Nurse Practitioner

## 2018-08-14 ENCOUNTER — Inpatient Hospital Stay: Payer: Medicaid Other

## 2018-08-15 ENCOUNTER — Inpatient Hospital Stay: Payer: Medicaid Other

## 2018-08-15 ENCOUNTER — Other Ambulatory Visit: Payer: Self-pay | Admitting: *Deleted

## 2018-08-15 ENCOUNTER — Inpatient Hospital Stay (HOSPITAL_BASED_OUTPATIENT_CLINIC_OR_DEPARTMENT_OTHER): Payer: Medicaid Other | Admitting: Nurse Practitioner

## 2018-08-15 ENCOUNTER — Encounter: Payer: Self-pay | Admitting: Nurse Practitioner

## 2018-08-15 ENCOUNTER — Other Ambulatory Visit: Payer: Self-pay

## 2018-08-15 VITALS — BP 135/86 | HR 75 | Temp 96.6°F | Resp 20

## 2018-08-15 DIAGNOSIS — C530 Malignant neoplasm of endocervix: Secondary | ICD-10-CM

## 2018-08-15 DIAGNOSIS — D649 Anemia, unspecified: Secondary | ICD-10-CM

## 2018-08-15 DIAGNOSIS — C538 Malignant neoplasm of overlapping sites of cervix uteri: Secondary | ICD-10-CM | POA: Diagnosis not present

## 2018-08-15 DIAGNOSIS — E876 Hypokalemia: Secondary | ICD-10-CM

## 2018-08-15 DIAGNOSIS — Z95828 Presence of other vascular implants and grafts: Secondary | ICD-10-CM

## 2018-08-15 DIAGNOSIS — D6851 Activated protein C resistance: Secondary | ICD-10-CM

## 2018-08-15 DIAGNOSIS — R300 Dysuria: Secondary | ICD-10-CM

## 2018-08-15 DIAGNOSIS — R197 Diarrhea, unspecified: Secondary | ICD-10-CM

## 2018-08-15 DIAGNOSIS — Z5111 Encounter for antineoplastic chemotherapy: Secondary | ICD-10-CM | POA: Diagnosis not present

## 2018-08-15 LAB — URINALYSIS, COMPLETE (UACMP) WITH MICROSCOPIC
Bacteria, UA: NONE SEEN
Bilirubin Urine: NEGATIVE
GLUCOSE, UA: NEGATIVE mg/dL
HGB URINE DIPSTICK: NEGATIVE
Ketones, ur: NEGATIVE mg/dL
LEUKOCYTES UA: NEGATIVE
NITRITE: NEGATIVE
Protein, ur: NEGATIVE mg/dL
SPECIFIC GRAVITY, URINE: 1.011 (ref 1.005–1.030)
Squamous Epithelial / LPF: NONE SEEN (ref 0–5)
pH: 6 (ref 5.0–8.0)

## 2018-08-15 LAB — CBC WITH DIFFERENTIAL/PLATELET
Abs Immature Granulocytes: 0.01 10*3/uL (ref 0.00–0.07)
BASOS PCT: 0 %
Basophils Absolute: 0 10*3/uL (ref 0.0–0.1)
EOS PCT: 1 %
Eosinophils Absolute: 0 10*3/uL (ref 0.0–0.5)
HCT: 25.8 % — ABNORMAL LOW (ref 36.0–46.0)
Hemoglobin: 8.3 g/dL — ABNORMAL LOW (ref 12.0–15.0)
Immature Granulocytes: 0 %
Lymphocytes Relative: 27 %
Lymphs Abs: 0.8 10*3/uL (ref 0.7–4.0)
MCH: 33.2 pg (ref 26.0–34.0)
MCHC: 32.2 g/dL (ref 30.0–36.0)
MCV: 103.2 fL — ABNORMAL HIGH (ref 80.0–100.0)
MONO ABS: 0.2 10*3/uL (ref 0.1–1.0)
Monocytes Relative: 7 %
Neutro Abs: 2 10*3/uL (ref 1.7–7.7)
Neutrophils Relative %: 65 %
PLATELETS: 134 10*3/uL — AB (ref 150–400)
RBC: 2.5 MIL/uL — AB (ref 3.87–5.11)
RDW: 17 % — ABNORMAL HIGH (ref 11.5–15.5)
WBC: 3.1 10*3/uL — AB (ref 4.0–10.5)
nRBC: 0 % (ref 0.0–0.2)

## 2018-08-15 LAB — COMPREHENSIVE METABOLIC PANEL
ALT: 41 U/L (ref 0–44)
AST: 28 U/L (ref 15–41)
Albumin: 3.5 g/dL (ref 3.5–5.0)
Alkaline Phosphatase: 151 U/L — ABNORMAL HIGH (ref 38–126)
Anion gap: 3 — ABNORMAL LOW (ref 5–15)
BILIRUBIN TOTAL: 0.2 mg/dL — AB (ref 0.3–1.2)
BUN: 10 mg/dL (ref 6–20)
CO2: 25 mmol/L (ref 22–32)
CREATININE: 0.71 mg/dL (ref 0.44–1.00)
Calcium: 8.6 mg/dL — ABNORMAL LOW (ref 8.9–10.3)
Chloride: 109 mmol/L (ref 98–111)
GFR calc Af Amer: >60 mL/min (ref >60)
GFR calc non Af Amer: >60 mL/min (ref >60)
Glucose, Bld: 98 mg/dL (ref 70–99)
POTASSIUM: 3.4 mmol/L — AB (ref 3.5–5.1)
Sodium: 137 mmol/L (ref 135–145)
TOTAL PROTEIN: 7.1 g/dL (ref 6.5–8.1)

## 2018-08-15 LAB — RETICULOCYTES
Immature Retic Fract: 16 % — ABNORMAL HIGH (ref 2.3–15.9)
RBC.: 2.23 MIL/uL — AB (ref 3.87–5.11)
RETIC CT PCT: 4.4 % — AB (ref 0.4–3.1)
Retic Count, Absolute: 96.4 10*3/uL (ref 19.0–186.0)

## 2018-08-15 LAB — IRON AND TIBC
Iron: 54 ug/dL (ref 28–170)
SATURATION RATIOS: 21 % (ref 10.4–31.8)
TIBC: 260 ug/dL (ref 250–450)
UIBC: 206 ug/dL

## 2018-08-15 LAB — FOLATE: Folate: 7 ng/mL (ref >5.9)

## 2018-08-15 LAB — FERRITIN: Ferritin: 393 ng/mL — ABNORMAL HIGH (ref 11–307)

## 2018-08-15 LAB — TSH: TSH: 1.182 u[IU]/mL (ref 0.350–4.500)

## 2018-08-15 LAB — MAGNESIUM: Magnesium: 1.5 mg/dL — ABNORMAL LOW (ref 1.7–2.4)

## 2018-08-15 MED ORDER — SODIUM CHLORIDE 0.9% FLUSH
10.0000 mL | INTRAVENOUS | Status: DC | PRN
  Administered 2018-08-15: 10 mL via INTRAVENOUS
  Filled 2018-08-15: qty 10

## 2018-08-15 MED ORDER — SODIUM CHLORIDE 0.9 % IV SOLN
Freq: Once | INTRAVENOUS | Status: AC
  Administered 2018-08-15: 12:00:00 via INTRAVENOUS
  Filled 2018-08-15: qty 10

## 2018-08-15 MED ORDER — HEPARIN SOD (PORK) LOCK FLUSH 100 UNIT/ML IV SOLN
500.0000 [IU] | Freq: Once | INTRAVENOUS | Status: AC
  Administered 2018-08-15: 500 [IU] via INTRAVENOUS

## 2018-08-15 NOTE — Progress Notes (Signed)
Patient here to be evaluated in the symptom management clinic for ongoing diarrhea and possible need for electrolyte replacement.  Patient states that she did not get her chemotherapy this week, but did receive IV potassium and magnesium.

## 2018-08-15 NOTE — Progress Notes (Signed)
Symptom Management [place]  [phone] [phone]  Patient Care Team: Arnetha Courser, MD as PCP - General (Family Medicine) Mellody Drown, MD as Consulting Physician (Obstetrics and Gynecology) Lenor Coffin, MD as Attending Physician (Gynecology) Lequita Asal, MD as Medical Oncologist (Hematology and Oncology) [name], MD as Consulting Physician (Gastroenterology) Michael Boston, MD as Consulting Physician (General Surgery) Lovell Sheehan, MD as Consulting Physician (Orthopedic Surgery) Clent Jacks, RN as Registered Nurse   Name of the patient: Joanna Hall  [id number]  1966/03/07   Date of visit: 08/15/18  Diagnosis-metastatic cervical cancer  Chief complaint/ Reason for visit- Diarrhea  Heme/Onc history:  Oncology History   Patient was diagnosed with cervical adenocarcinoma in [place] in 09/2016.  She has had a long-standing history of abnormal Pap smears.  She presented with an ovarian cyst.  Laparoscopic surgery was pursued but was difficult due to scar tissue.  Had BSO and rupture of cyst with purulent drainage into the abdomen.  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  Radical hysterectomy was aborted.  She was treated by Dr. Christene Slates at [facility] center in [place], [place].  Decision was made to pursue concurrent chemotherapy (weekly cisplatin) and radiation.  She received treatment from 11/2016-05/2017.  01/2017 cisplatin x 2 and carboplatin x 1 (01/29/2017) due to ARF and XRT.  XRT was followed by T & O on 02/01/2017 and T & N 02/10/2017 and 02/20/2017.  Course was complicated by 80 pound weight loss, nausea, vomiting, electrolyte wasting (potassium and magnesium).  She describes that.  Is been sick constantly requiring at least 20 hospitalizations.  Follow-up CT chest and PET on 06/2017. Per patient, 'radiation worked' and no disease in the  abdomen.  At that time she was noted to have lung nodules that were growing and follow-up imaging was scheduled for 10/2017.  She was admitted to hospital in [place] for small bowel obstruction which was managed conservatively and she was home for a week prior to traveling to [place] for Thanksgiving holiday where she has family.  She presented to ER in [place] on 08/2017 with nausea, vomiting, and lower abdominal pain.  Symptoms did not respond to conservative treatment.  CT on 08/26/2017 revealed small bowel obstruction with transition in the pelvis just superior to the uterus rather was a long segment of distal ileum with fatty wall thickening compatible with chronic inflammation and/or radiation enteritis. Imaging showed numerous pulmonary nodules consistent with metastatic disease. She underwent laparotomy and right ileocolectomy on 08/31/2017 at [facility].  Surgical findings revealed a thickened, matted, and scarred piece of distal small bowel close to the ileocecal valve.  She was discharged on 09/05/2017.  Pain markedly increased in intensity and imaging was performed on 09/11/2017 which revealed: Debris within the anterior abdominal wall incision concerning for infection versus packing material, s/p post ileo-colectomy with expected postoperative changes, mild colonic ileus, numerous pulmonary nodules highly concerning for metastatic disease, punctate nonobstructing nephrolithiasis.  Staples were removed and one was packed.  She was started on doxycycline.  Abdominal and pelvic CT without contrast on 09/11/2017 revealed debris within anterior abdominal wall incision concerning for infection, versus packing material.She is s/pileocolectomy with expected postoperative changes and mild colonic ileus. There were numerous pulmonary nodules highly concerning for metastatic diseaseand punctate nonobstructing nephrolithiasis. She was readmitted on 09/12/2017. She describes the onset  of lower abdominal pain on 09/09/2017. Pain markedly increased in intensity on  09/11/2017.  Staples were removed and the wound packed. She was started on doxycycline.  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  PET scan was ordered to evaluate enlarging lung nodules with concern for recurrent cervical cancer but scan was delayed due to insurance and need to be performed in [place].  Presented to ER on 12/03/2017 for abdominal pain and emesis.  Imaging concerning for worsening possible uterine infarct and she was admitted to hospital.  Pelvic MRI was unremarkable.  Remote scarring type changes of uterus thought to be possibly related to radiation.  She was discharged on 12/08/2017.  Underwent endoscopy and colonoscopy on 12/20/2017.    On 02/22/2018 she underwent laser ablation of condylomata around the anus and vagina under anesthesia with Dr. Johney Maine.   04/15/2018: Chest, abdomen, and pelvis CTrevealed innumerable (>100) cavitary nodules scattered in the lungs, moderately enlarging compared to the 11/08/2017 PET-CT, suspicious for metastatic disease. One index node in the RLL measures 1.0 x 1.1 cm (previously 0.6 x 0.6 cm). There were no new nodules. There was an ill-defined wall thickening in the rectosigmoid with surrounding stranding along fascia planes, probably sequela from prior radiation therapy. There was multilevel lumbar impingement due to spondylosis and degenerative disc disease. There was heterogeneous enhancement in the uterus (some possibly from prior radiation therapy).  04/23/2018:PET scan  revealednumerous scattered solid and cavitary nodules in the lungs stable increased in size compared to the prior PET-CT from 11/08/2017.Largest nodule was 1.1 cm in the LUL (SUV 1.9).These demonstratedlow-grade metabolic activity up to a maximum SUV of about 2.3, increased from 11/08/2017.   Case was discussed at tumor board on 04/25/2018. Consensus to pursue CT-guided biopsy (05/06/18) which revealed: Metastatic adenocarcinoma, morphologically consistent with cervical adenocarcinoma.  She has history of chronic hepatitis C which is managed by GI.  Hepatitis C genotype is 2a/2c.  She receives B12 injections for history of B12 deficiency.  On 04/24/2018 she underwent left total knee replacement with Dr. [name].     Malignant neoplasm of overlapping sites of cervix ([place])   09/18/2017 Initial Diagnosis    Malignant neoplasm of overlapping sites of cervix ([place])    05/15/2018 -  Chemotherapy    The patient had palonosetron (ALOXI) injection 0.25 mg, 0.25 mg, Intravenous,  Once, 3 of 6 cycles Administration: 0.25 mg (06/04/2018), 0.25 mg (06/25/2018), 0.25 mg (07/23/2018) pegfilgrastim (NEULASTA) injection 6 mg, 6 mg, Subcutaneous, Once, 3 of 6 cycles Administration: 6 mg (06/06/2018), 6 mg (06/26/2018), 6 mg (07/24/2018) CARBOplatin (PARAPLATIN) 500 mg in sodium chloride 0.9 % 250 mL chemo infusion, 500 mg (100 % of original dose 497.2 mg), Intravenous,  Once, 3 of 6 cycles Dose modification:   (original dose 497.2 mg, Cycle 1, Reason: Provider Judgment, Comment: difficulty with counts with initial chemo in [place]; advance dose as tolerated),   (original dose 497.2 mg, Cycle 5, Reason: Provider Judgment, Comment: return back to original dose) Administration: 500 mg (06/04/2018), 560 mg (06/25/2018), 500 mg (07/23/2018) PACLitaxel (TAXOL) 264 mg in sodium chloride 0.9 % 250 mL chemo infusion (> 69m/m2), 140 mg/m2 = 264 mg (100 % of original dose 140 mg/m2), Intravenous,  Once, 3 of 6  cycles Dose  modification: 140 mg/m2 (original dose 140 mg/m2, Cycle 1, Reason: Provider Judgment, Comment: difficulty with counts with initial chemo in [place]; advance dose as tolerated), 155 mg/m2 (original dose 140 mg/m2, Cycle 2, Reason: Provider Judgment, Comment: advance as tolerated), 155 mg/m2 (original dose 175 mg/m2, Cycle 5, Reason: Provider Judgment, Comment: continue current dose) Administration: 264 mg (06/04/2018), 294 mg (06/25/2018), 294 mg (07/23/2018)  for chemotherapy treatment.      Interval history-Tamberly Zulueta, 52 year old female with above history of metastatic cervical cancer originally diagnosed with stage I B1 cervical cancer in 2018 and received concurrent radiation and cisplatin.  She previously received pelvic radiation.  She underwent right ileocolectomy on 08/31/2017 for small bowel obstruction and has had ongoing diarrhea chronically since that time.  She is followed by Dr. Marius Ditch with GI.  She was diagnosed with metastatic cervical cancer on 05/06/2018 and has received 3 cycles of carboplatin and Taxol with most recent cycle on 07/23/2018.  She has received Neulasta support.    At baseline she has 4-5 soft bowel movements and says that over the last 24 hours her bowel movements became more watery and occurring more frequently.  She describes them as dark, fatty, and sinking to the toilet.  They occur throughout the day and night.  She denies abdominal pain, nausea, or vomiting.  She states that she has felt weaker and thinks that she may need electrolyte replacement.  She was previously seen in ER on 08/09/2018 for dental pain, started on Augmentin for dental abscess.  She was switched to amoxicillin by Dr. Mike Gip during office visit on 08/13/2018 to minimize risk of diarrhea.  She previously presented to ER on 07/27/2018 with vomiting, abdominal pain, and diarrhea.  Stool studies were negative.  Antibiotics were discontinued.  She continued to have diarrhea and EGD and  colonoscopy were performed. Biopsies were performed with results as below: DIAGNOSIS:  A. DUODENUM; COLD BIOPSY:  - MODERATE DUODENOPATHY WITH VILLOUS BLUNTING, SEE COMMENT.  Comment:  The duodenal biopsies show shortened and shrunken villi with reactive changes of surface epithelium. Intraepithelial lymphocytes are not increased. Peptic duodenitis and medication effect should be considered. The features are not typical for malabsorption, but clinical correlation is recommended.   B. NEO-TERMINAL ILEUM; COLD BIOPSY:  - ILEAL MUCOSA WITH INTACT VILLI.  - NEGATIVE FOR ACTIVE INFLAMMATION, INTRAEPITHELIAL LYMPHOCYTOSIS, INFECTIOUS AGENTS, AND GRANULOMAS.  - NEGATIVE FOR DYSPLASIA AND MALIGNANCY.   C. COLON; RANDOM COLD BIOPSY:  - NONSPECIFIC CRYPT HYPERPLASIA.  - NEGATIVE FOR ACTIVE INFLAMMATION AND MICROSCOPIC COLITIS.  - NEGATIVE FOR DYSPLASIA AND MALIGNANCY.   Diarrhea thought to be secondary to chemotherapy and had diarrhea control with multiple medications including: colestipol, dicyclomine, Lomotil, Zyprexa, and opium tincture.  She required IV replacement of fluids, magnesium, and potassium.  She has history of factor V Leiden mutation and DVT and continues Xarelto.   ECOG FS:2 - Symptomatic, <50% confined to bed  Review of systems- Review of Systems  Constitutional: Negative for chills, fever, malaise/fatigue and weight loss.  HENT: Negative for congestion, ear discharge, ear pain, sinus pain, sore throat and tinnitus.   Eyes: Negative.   Respiratory: Negative.  Negative for cough, sputum production and shortness of breath.   Cardiovascular: Negative for chest pain, palpitations, orthopnea, claudication and leg swelling.  Gastrointestinal: Positive for blood in stool (hx of hemorrhoids) and diarrhea (per hpi). Negative for abdominal pain, constipation, heartburn, nausea and vomiting.  Genitourinary: Negative.   Musculoskeletal: Negative.   Skin: Negative.  Neurological:  Positive for weakness. Negative for dizziness, tingling and headaches.  Endo/Heme/Allergies: Negative.   Psychiatric/Behavioral: The patient is nervous/anxious (pt concerned re: delaying chemo d/t symptoms & dec counts).    Current treatment- s/p Cycle 3 carboplatin-Taxol with Neulasta support on 07/24/2018  Allergies  Allergen Reactions  . Ketamine Anxiety and Other (See Comments)    Syncope episode/confusion     Past Medical History:  Diagnosis Date  . Abnormal cervical Papanicolaou smear 09/18/2017  . Anxiety   . Aortic atherosclerosis ([place])   . Arthritis    neck and knees  . 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 Blood transfusion without reported diagnosis   . Cervical cancer (HCC)    mets lung  . Chronic anal fissure   . Chronic diarrhea   . Dyspnea   . Factor V Leiden mutation ([place])   . Fecal incontinence   . Genital warts   . GERD (gastroesophageal reflux disease)   . Heart murmur   . Hemorrhoids   . 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 History of cancer chemotherapy    completed 06/2017  . History of Clostridium difficile infection    while undergoing chemo.  Negative test 10/2017  . 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 Intestinal infection due to Clostridium difficile 09/18/2017  . Macrocytic anemia with vitamin B12 deficiency   . Perianal condylomata   . Pneumonia    History of  . Pulmonary nodules   . Rectal bleeding   . Small bowel obstruction ([place]) 08/2017  . Stiff neck    limited right turn  . Vitamin D deficiency     Past Surgical History:  Procedure Laterality Date  . CHOLECYSTECTOMY    . COLON SURGERY  08/2017   resection  . COLONOSCOPY WITH PROPOFOL N/A 12/20/2017   Procedure: COLONOSCOPY WITH PROPOFOL;  Surgeon: [name], MD;  Location[facility] ENDOSCOPY;  Service: Gastroenterology;  Laterality: N/A;  . COLONOSCOPY WITH PROPOFOL N/A 07/30/2018   Procedure: COLONOSCOPY  WITH PROPOFOL;  Surgeon: [name], MD;  Location: [facility] ENDOSCOPY;  Service: Gastroenterology;  Laterality: N/A;  . DIAGNOSTIC LAPAROSCOPY    . ESOPHAGOGASTRODUODENOSCOPY (EGD) WITH PROPOFOL N/A 12/20/2017   Procedure: ESOPHAGOGASTRODUODENOSCOPY (EGD) WITH PROPOFOL;  Surgeon: [name], MD;  Location: [place];  Service: Gastroenterology;  Laterality: N/A;  . ESOPHAGOGASTRODUODENOSCOPY (EGD) WITH PROPOFOL  07/30/2018   Procedure: ESOPHAGOGASTRODUODENOSCOPY (EGD) WITH PROPOFOL;  Surgeon: [name], MD;  Location: [facility] ENDOSCOPY;  Service: Gastroenterology;;  . LAPAROTOMY N/A 08/31/2017   Procedure: EXPLORATORY LAPAROTOMY for SBO, ileocolectomy, removal of piece of uterine wall;  Surgeon: [name], MD;  Location: [facility] ORS;  Service: General;  Laterality: N/A;  . LASER ABLATION CONDOLAMATA N/A 02/22/2018   Procedure: LASER ABLATION/REMOVAL OF [id number] AROUND ANUS AND VAGINA;  Surgeon: Michael Boston, MD;  Location: [place];  Service: General;  Laterality: N/A;  . OOPHORECTOMY    . PORTA CATH INSERTION N/A 05/13/2018   Procedure: PORTA CATH INSERTION;  Surgeon: Katha Cabal, MD;  Location: [facility] CV LAB;  Service: Cardiovascular;  Laterality: N/A;  . SMALL INTESTINE SURGERY    . TANDEM RING INSERTION     x3  . THORACOTOMY    . TOTAL KNEE ARTHROPLASTY Left 04/24/2018   Procedure: TOTAL KNEE ARTHROPLASTY;  Surgeon: Lovell Sheehan, MD;  Location: [facility] ORS;  Service:  Orthopedics;  Laterality: Left;    Social History   Socioeconomic History  . Marital status: Legally Separated    Spouse name: Not on file  . Number of children: Not on file  . Years of education: Not on file  . Highest education level: Not on file  Occupational History  . Not on file  Social Needs  . Financial resource strain: Not hard at all  . Food insecurity:    Worry: Never true    Inability: Never true  . Transportation needs:    Medical: No     Non-medical: No  Tobacco Use  . Smoking status: Former Smoker    Last attempt to quit: 10/16/2006    Years since quitting: 11.8  . Smokeless tobacco: Never Used  Substance and Sexual Activity  . Alcohol use: Yes    Frequency: Never    Comment: seldom  . Drug use: Yes    Types: Marijuana  . Sexual activity: Not Currently    Birth control/protection: Post-menopausal    Comment: Not Asked  Lifestyle  . Physical activity:    Days per week: Patient refused    Minutes per session: Patient refused  . Stress: Only a little  Relationships  . Social connections:    Talks on phone: Patient refused    Gets together: Patient refused    Attends religious service: Patient refused    Active member of club or organization: Patient refused    Attends meetings of clubs or organizations: Patient refused    Relationship status: Patient refused  . Intimate partner violence:    Fear of current or ex partner: No    Emotionally abused: No    Physically abused: No    Forced sexual activity: No  Other Topics Concern  . Not on file  Social History Narrative  . Not on file    Family History  Problem Relation Age of Onset  . Hypertension Father   . Diabetes Father   . Alcohol abuse Daughter   . Hypertension Maternal Grandmother   . Diabetes Maternal Grandmother   . Diabetes Paternal Grandmother   . Hypertension Paternal Grandmother     Current Outpatient Medications:  .  amoxicillin (AMOXIL) 500 MG capsule, Take 1 capsule (500 mg total) by mouth 3 (three) times daily for 6 days., Disp: 18 capsule, Rfl: 0 .  budesonide (ENTOCORT EC) 3 MG 24 hr capsule, Take 3 capsules (9 mg total) by mouth daily., Disp: 90 capsule, Rfl: 2 .  Calcium Carb-Cholecalciferol (CALCIUM 500 +D) 500-400 MG-UNIT TABS, Take 2 tablets by mouth daily., Disp: , Rfl:  .  colestipol (COLESTID) 1 g tablet, Take 2 tablets (2 g total) by mouth 3 (three) times daily with meals., Disp: 180 tablet, Rfl: 0 .  cyanocobalamin (,VITAMIN  B-12,) 1000 MCG/ML injection, Inject 1ML every week x 4 weeks, then 1ML every other week x 2 months, then monthly x 3 months (Patient taking differently: Inject 1,000 mcg into the muscle every 30 (thirty) days. Inject 1ML every week x 4 weeks, then 1ML every other week x 2 months, then monthly x 3 months), Disp: 10 mL, Rfl: 1 .  dicyclomine (BENTYL) 20 MG tablet, Take 1 tablet (20 mg total) by mouth every 6 (six) hours. (Patient taking differently: Take 20 mg by mouth every 6 (six) hours. Taking as needed), Disp: 20 tablet, Rfl: 0 .  HYDROcodone-acetaminophen (NORCO/VICODIN) 5-325 MG tablet, Take 1 tablet by mouth every 6 (six) hours as needed for moderate pain.,  Disp: 20 tablet, Rfl: 0 .  loratadine (CLARITIN) 10 MG tablet, Take 10 mg by mouth daily., Disp: , Rfl:  .  Multiple Vitamins-Minerals (MULTIVITAMIN WITH MINERALS) tablet, Take 1 tablet by mouth daily., Disp: 30 tablet, Rfl: 0 .  OLANZapine zydis (ZYPREXA) 5 MG disintegrating tablet, Take 1 tablet (5 mg total) by mouth at bedtime., Disp: 30 tablet, Rfl: 0 .  ondansetron (ZOFRAN) 8 MG tablet, Take 1 tablet (8 mg total) by mouth every 8 (eight) hours as needed for nausea or vomiting., Disp: 30 tablet, Rfl: 0 .  Opium 10 MG/ML (1%) TINC, Take 1 mL (10 mg total) by mouth 3 (three) times daily with meals., Disp: 473 mL, Rfl: 0 .  pantoprazole (PROTONIX) 40 MG tablet, Take 1 tablet (40 mg total) by mouth daily., Disp: 30 tablet, Rfl: 0 .  promethazine (PHENERGAN) 25 MG tablet, Take 1 tablet (25 mg total) by mouth every 6 (six) hours as needed for nausea or vomiting., Disp: 30 tablet, Rfl: 0 .  rivaroxaban (XARELTO) 20 MG TABS tablet, Take 1 tablet (20 mg total) by mouth daily with supper., Disp: 30 tablet, Rfl: 1 .  sertraline (ZOLOFT) 50 MG tablet, Take 1 tablet (50 mg total) by mouth daily., Disp: 30 tablet, Rfl: 3 .  Syringe/Needle, Disp, (SYRINGE 3CC/22GX1") 22G X 1" 3 ML MISC, For use with Vitamin B12 injections, Disp: 50 each, Rfl: 0 .   traZODone (DESYREL) 50 MG tablet, Take 0.5-1 tablets (25-50 mg total) by mouth at bedtime as needed for sleep., Disp: 30 tablet, Rfl: 0 .  VENTOLIN HFA 108 (90 Base) MCG/ACT inhaler, Inhale 1-2 puffs into the lungs every 4 (four) hours as needed for shortness of breath. , Disp: , Rfl: 0 No current facility-administered medications for this visit.   Facility-Administered Medications Ordered in Other Visits:  .  heparin lock flush 100 unit/mL, 500 Units, Intravenous, Once, Corcoran, Melissa C, MD .  heparin lock flush 100 unit/mL, 500 Units, Intravenous, Once, Verlon Au, NP  Physical exam:  Vitals:   08/15/18 1119  BP: 135/86  Pulse: 75  Resp: 20  Temp: (!) 96.6 F (35.9 C)  TempSrc: Tympanic   Physical Exam  Constitutional: She is oriented to person, place, and time. She appears well-developed and well-nourished.  HENT:  Head: Atraumatic.  Mouth/Throat: Oropharynx is clear and moist. No oropharyngeal exudate.  Eyes: Pupils are equal, round, and reactive to light. Conjunctivae are normal. No scleral icterus.  Neck: Normal range of motion.  Cardiovascular: Normal rate, regular rhythm and normal heart sounds.  Pulmonary/Chest: Effort normal and breath sounds normal.  Abdominal: Soft. Bowel sounds are normal. She exhibits no distension. There is no tenderness.  Musculoskeletal: She exhibits no edema.  Neurological: She is alert and oriented to person, place, and time.  Skin: Skin is warm and dry. There is pallor.  Psychiatric: She has a normal mood and affect.     CMP Latest Ref Rng & Units 08/15/2018  Glucose 70 - 99 mg/dL 98  BUN 6 - 20 mg/dL 10  Creatinine 0.44 - 1.00 mg/dL 0.71  Sodium 135 - 145 mmol/L 137  Potassium 3.5 - 5.1 mmol/L 3.4(L)  Chloride 98 - 111 mmol/L 109  CO2 22 - 32 mmol/L 25  Calcium 8.9 - 10.3 mg/dL 8.6(L)  Total Protein 6.5 - 8.1 g/dL 7.1  Total Bilirubin 0.3 - 1.2 mg/dL 0.2(L)  Alkaline Phos 38 - 126 U/L 151(H)  AST 15 - 41 U/L 28  ALT 0 - 44  U/L 41   CBC Latest Ref Rng & Units 08/15/2018  WBC 4.0 - 10.5 K/uL 3.1(L)  Hemoglobin 12.0 - 15.0 g/dL 8.3(L)  Hematocrit 36.0 - 46.0 % 25.8(L)  Platelets 150 - 400 K/uL 134(L)    No images are attached to the encounter.  Ct Abdomen Pelvis W Contrast  Result Date: 07/27/2018 CLINICAL DATA:  Pt with generalized abd/pelvic pain and N/V/D since last night. Pt states cervical ca and is having treatments ^137m ISOVUE-300 IOPAMIDOL (ISOVUE-300) INJECTION 61% EXAM: CT ABDOMEN AND PELVIS WITH CONTRAST TECHNIQUE: Multidetector CT imaging of the abdomen and pelvis was performed using the standard protocol following bolus administration of intravenous contrast. CONTRAST:  1058mISOVUE-300 IOPAMIDOL (ISOVUE-300) INJECTION 61% COMPARISON:  None. FINDINGS: Lower chest: Multiple small nodules again appreciated at the bilateral lung bases, some cavitary, compatible with previously described known metastatic disease. No new findings at either lung base. No pericardial effusion at the heart base. Hepatobiliary: No focal liver abnormality is seen. Status post cholecystectomy. No biliary dilatation. Pancreas: Unremarkable. No pancreatic ductal dilatation or surrounding inflammatory changes. Spleen: Normal in size without focal abnormality. Adrenals/Urinary Tract: Adrenal glands appear normal. Kidneys appear normal without mass, stone or hydronephrosis. Bladder appears normal. Stomach/Bowel: Status post RIGHT hemicolectomy. There is thickening of the walls of the entire colon, most prominent within the transverse colon, indicating colitis. No dilated large or small bowel loops. Stomach is unremarkable. Vascular/Lymphatic: Aortic atherosclerosis. No enlarged abdominal or pelvic lymph nodes. Reproductive: No adnexal mass or free fluid. Other: Fluid stranding within the lower pelvis, likely treatment related. No abscess collection seen. No free intraperitoneal air. Musculoskeletal: No acute or suspicious osseous finding.  Mild degenerative spondylitic changes throughout the slightly scoliotic thoracolumbar spine. IMPRESSION: 1. 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 2. No acute or significant findings elsewhere within the abdomen or pelvis. No bowel obstruction. No abscess collection. No free intraperitoneal air. No evidence of acute solid organ abnormality. 3. Stable small nodules at the lung bases, some cavitary, compatible with previously described known metastatic disease. 4. Aortic Atherosclerosis (ICD10-I70.0). Electronically Signed   By: StFranki Cabot.D.   On: 07/27/2018 13:42   UsKoreabdomen Limited Ruq  Result Date: 07/16/2018 CLINICAL DATA:  Right upper quadrant pain. Elevated liver function tests. EXAM: ULTRASOUND ABDOMEN LIMITED RIGHT UPPER QUADRANT COMPARISON:  None. FINDINGS: Gallbladder: Status post prior cholecystectomy. Common bile duct: Diameter: 9.2 mm, mildly dilated, postsurgical. Liver: No focal lesion identified. Within normal limits in parenchymal echogenicity. Portal vein is patent on color Doppler imaging with normal direction of blood flow towards the liver. IMPRESSION: Normal liver. Prior cholecystectomy with postsurgical mild dilatation of common bile duct. Electronically Signed   By: WeAbelardo Diesel.D.   On: 07/16/2018 14:22    Assessment and plan- Patient is a [age].o. female diagnosed with metastatic cervical cancer who presents to symptom management clinic for diarrhea.  1.  Recurrent stage I B1 cervical cancer- s/p concurrent cisplatin and radiation (XRT and vaginal brachytherapy). PET  On 06/2017 showed enlarging pulmonary nodules.  On 08/31/2017 she underwent right ileocolectomy for small bowel obstruction.  Biopsy of left upper lobe pulmonary  nodule on 05/06/2018 confirmed metastatic adenocarcinoma consistent with cervical adenocarcinoma.  Initiated carboplatin and Taxol on 06/04/2018. Now s/p 3 cycles, most recent on 07/23/18. Receiving udencya support.  2.  Acute on chronic diarrhea- acutely worse with unclear etiology.  Diarrhea now oily, black occurring 5-10 times per day. Bilirubin 0.2. Was previously controlled on Questran, Lomotil, Colestid, Bentyl, Opium tincture. Recent EGD and colonoscopy unrevealing. Recent stool studies negative. Discussed with Dr. Mike Gip and Dr. Malena Timpone Norris who recommended adding Viberzi and if not controlled, adding Lotronex.   3.  Anemia- hemoglobin 8.3, HCT 25.8, MCV 103.2.  Hemoglobin continues to trend down.  Last chemotherapy ~3 weeks ago.  Folate 7.0 normal, ferritin 393 normal/elevated, vitamin b-12 3,380 elevated, iron studies normal, reticulocytes 4.4% increased, TSH 1.182. Given her disease and history of prior radiation I suspect that she has poor bone marrow reserves vs bleeding/blood loss, vs chemotherapy induced.  Colonoscopy and upper endoscopy on 07/30/2018 were essentially normal/without evidence of bleeding. Discussed with Dr. Mike Gip who reached out to Dr. Ashmi Blas Norris (GI). He recommends stool guaiac and advises that a negative result would clinically be more useful than positive. Could consider capsule study.   4.  Hypokalemia-likely related to chronic diarrhea.  Will replace with IV potassium 20 mEq today in clinic.  5.  Hypomagnesemia-likely related to chronic diarrhea.  Will replace IV magnesium 2 g today in clinic.  6.  Dysuria-complains of some burning with urination.  UA checked today was negative without source of infection.  She has some skin irritation due to chronic diarrhea which may be because of pain.  Advised patient to use barrier cream such as Boudreaux's Butt Paste or Desitin.   7. Factor V Leiden mutation carrier- currently on Xarelto. Continue at this time.   RTC tomorrow for  re-evaluation and stool guaiac.    Visit Diagnosis 1. Malignant neoplasm of overlapping sites of cervix ([place])   2. Acute diarrhea   3. Anemia, unspecified type   4. Hypokalemia   5. Hypomagnesemia   6. Dysuria   7. Factor V Leiden carrier [facility])     Patient expressed understanding and was in agreement with this plan. She also understands that She can call clinic at any time with any questions, concerns, or complaints.   Thank you for allowing me to participate in the care of this very pleasant patient.   Beckey Rutter, DNP, AGNP-C [place] [facility] [phone] (work cell) [phone] (office)

## 2018-08-16 ENCOUNTER — Encounter: Payer: Self-pay | Admitting: Nurse Practitioner

## 2018-08-16 ENCOUNTER — Inpatient Hospital Stay: Payer: Medicaid Other | Attending: Nurse Practitioner | Admitting: Nurse Practitioner

## 2018-08-16 ENCOUNTER — Other Ambulatory Visit: Payer: Self-pay

## 2018-08-16 VITALS — BP 132/88 | HR 78 | Temp 98.4°F | Resp 18

## 2018-08-16 DIAGNOSIS — K529 Noninfective gastroenteritis and colitis, unspecified: Secondary | ICD-10-CM

## 2018-08-16 DIAGNOSIS — D6851 Activated protein C resistance: Secondary | ICD-10-CM | POA: Diagnosis not present

## 2018-08-16 DIAGNOSIS — E860 Dehydration: Secondary | ICD-10-CM | POA: Insufficient documentation

## 2018-08-16 DIAGNOSIS — C7800 Secondary malignant neoplasm of unspecified lung: Secondary | ICD-10-CM | POA: Insufficient documentation

## 2018-08-16 DIAGNOSIS — Z79899 Other long term (current) drug therapy: Secondary | ICD-10-CM | POA: Diagnosis not present

## 2018-08-16 DIAGNOSIS — D649 Anemia, unspecified: Secondary | ICD-10-CM

## 2018-08-16 DIAGNOSIS — C538 Malignant neoplasm of overlapping sites of cervix uteri: Secondary | ICD-10-CM | POA: Diagnosis not present

## 2018-08-16 DIAGNOSIS — Z5111 Encounter for antineoplastic chemotherapy: Secondary | ICD-10-CM | POA: Diagnosis not present

## 2018-08-16 DIAGNOSIS — D500 Iron deficiency anemia secondary to blood loss (chronic): Secondary | ICD-10-CM | POA: Insufficient documentation

## 2018-08-16 DIAGNOSIS — R197 Diarrhea, unspecified: Secondary | ICD-10-CM | POA: Diagnosis not present

## 2018-08-16 DIAGNOSIS — A0472 Enterocolitis due to Clostridium difficile, not specified as recurrent: Secondary | ICD-10-CM | POA: Insufficient documentation

## 2018-08-16 DIAGNOSIS — Z87891 Personal history of nicotine dependence: Secondary | ICD-10-CM

## 2018-08-16 LAB — VITAMIN B12: VITAMIN B 12: 3380 pg/mL — AB (ref 180–914)

## 2018-08-16 LAB — OCCULT BLOOD X 1 CARD TO LAB, STOOL: FECAL OCCULT BLD: POSITIVE — AB

## 2018-08-16 MED ORDER — ELUXADOLINE 100 MG PO TABS: 100.0000 mg | ORAL_TABLET | Freq: Two times a day (BID) | ORAL | 0 refills | Status: DC

## 2018-08-16 NOTE — Progress Notes (Signed)
Symptom Management [place]  Telephone:([phone]) [phone] [phone]) [phone]  Patient Care Team: Arnetha Courser, MD as PCP - General (Family Medicine) Mellody Drown, MD as Consulting Physician (Obstetrics and Gynecology) Lenor Coffin, MD as Attending Physician (Gynecology) Lequita Asal, MD as Medical Oncologist (Hematology and Oncology) [name], MD as Consulting Physician (Gastroenterology) Michael Boston, MD as Consulting Physician (General Surgery) Lovell Sheehan, MD as Consulting Physician (Orthopedic Surgery) Clent Jacks, RN as Registered Nurse   Name of the patient: Joanna Hall  [id number]  Nov 24, 1965   Date of visit: 08/16/18  Diagnosis-metastatic cervical cancer  Chief complaint/ Reason for visit- Diarrhea  Heme/Onc history:  Oncology History   Patient was diagnosed with cervical adenocarcinoma in [place] in 09/2016.  She has had a long-standing history of abnormal Pap smears.  She presented with an ovarian cyst.  Laparoscopic surgery was pursued but was difficult due to scar tissue.  Had BSO and rupture of cyst with purulent drainage into the abdomen.  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  Radical hysterectomy was aborted.  She was treated by Dr. Christene Slates at [facility] in [place], [place].  Decision was made to pursue concurrent chemotherapy (weekly cisplatin) and radiation.  She received treatment from 11/2016-05/2017.  01/2017 cisplatin x 2 and carboplatin x 1 (01/29/2017) due to ARF and XRT.  XRT was followed by T & O on 02/01/2017 and T & N 02/10/2017 and 02/20/2017.  Course was complicated by 80 pound weight loss, nausea, vomiting, electrolyte wasting (potassium and magnesium).  She describes that.  Is been sick constantly requiring at least 20 hospitalizations.  Follow-up CT chest and PET on 06/2017. Per patient, 'radiation worked' and no disease in the  abdomen.  At that time she was noted to have lung nodules that were growing and follow-up imaging was scheduled for 10/2017.  She was admitted to hospital in [place] for small bowel obstruction which was managed conservatively and she was home for a week prior to traveling to [place] for Thanksgiving holiday where she has family.  She presented to ER in [place] on 08/2017 with nausea, vomiting, and lower abdominal pain.  Symptoms did not respond to conservative treatment.  CT on 08/26/2017 revealed small bowel obstruction with transition in the pelvis just superior to the uterus rather was a long segment of distal ileum with fatty wall thickening compatible with chronic inflammation and/or radiation enteritis. Imaging showed numerous pulmonary nodules consistent with metastatic disease. She underwent laparotomy and right ileocolectomy on 08/31/2017 at [facility].  Surgical findings revealed a thickened, matted, and scarred piece of distal small bowel close to the ileocecal valve.  She was discharged on 09/05/2017.  Pain markedly increased in intensity and imaging was performed on 09/11/2017 which revealed: Debris within the anterior abdominal wall incision concerning for infection versus packing material, s/p post ileo-colectomy with expected postoperative changes, mild colonic ileus, numerous pulmonary nodules highly concerning for metastatic disease, punctate nonobstructing nephrolithiasis.  Staples were removed and one was packed.  She was started on doxycycline.  Abdominal and pelvic CT without contrast on 09/11/2017 revealed debris within anterior abdominal wall incision concerning for infection, versus packing material.She is s/pileocolectomy with expected postoperative changes and mild colonic ileus. There were numerous pulmonary nodules highly concerning for metastatic diseaseand punctate nonobstructing nephrolithiasis. She was readmitted on 09/12/2017. She describes the onset  of lower abdominal pain on 09/09/2017. Pain markedly increased in intensity on  09/11/2017.  Staples were removed and the wound packed. She was started on doxycycline.  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  PET scan was ordered to evaluate enlarging lung nodules with concern for recurrent cervical cancer but scan was delayed due to insurance and need to be performed in [place].  Presented to ER on 12/03/2017 for abdominal pain and emesis.  Imaging concerning for worsening possible uterine infarct and she was admitted to hospital.  Pelvic MRI was unremarkable.  Remote scarring type changes of uterus thought to be possibly related to radiation.  She was discharged on 12/08/2017.  Underwent endoscopy and colonoscopy on 12/20/2017.    On 02/22/2018 she underwent laser ablation of condylomata around the anus and vagina under anesthesia with Dr. Johney Maine.   04/15/2018: Chest, abdomen, and pelvis CTrevealed innumerable (>100) cavitary nodules scattered in the lungs, moderately enlarging compared to the 11/08/2017 PET-CT, suspicious for metastatic disease. One index node in the RLL measures 1.0 x 1.1 cm (previously 0.6 x 0.6 cm). There were no new nodules. There was an ill-defined wall thickening in the rectosigmoid with surrounding stranding along fascia planes, probably sequela from prior radiation therapy. There was multilevel lumbar impingement due to spondylosis and degenerative disc disease. There was heterogeneous enhancement in the uterus (some possibly from prior radiation therapy).  04/23/2018:PET scan  revealednumerous scattered solid and cavitary nodules in the lungs stable increased in size compared to the prior PET-CT from 11/08/2017.Largest nodule was 1.1 cm in the LUL (SUV 1.9).These demonstratedlow-grade metabolic activity up to a maximum SUV of about 2.3, increased from 11/08/2017.   Case was discussed at tumor board on 04/25/2018. Consensus to pursue CT-guided biopsy (05/06/18) which revealed: Metastatic adenocarcinoma, morphologically consistent with cervical adenocarcinoma.  She has history of chronic hepatitis C which is managed by GI.  Hepatitis C genotype is 2a/2c.  She receives B12 injections for history of B12 deficiency.  On 04/24/2018 she underwent left total knee replacement with Dr. [name].     Malignant neoplasm of overlapping sites of cervix ([place])   09/18/2017 Initial Diagnosis    Malignant neoplasm of overlapping sites of cervix ([place])    05/15/2018 -  Chemotherapy    The patient had palonosetron (ALOXI) injection 0.25 mg, 0.25 mg, Intravenous,  Once, 3 of 6 cycles Administration: 0.25 mg (06/04/2018), 0.25 mg (06/25/2018), 0.25 mg (07/23/2018) pegfilgrastim (NEULASTA) injection 6 mg, 6 mg, Subcutaneous, Once, 3 of 6 cycles Administration: 6 mg (06/06/2018), 6 mg (06/26/2018), 6 mg (07/24/2018) CARBOplatin (PARAPLATIN) 500 mg in sodium chloride 0.9 % 250 mL chemo infusion, 500 mg (100 % of original dose 497.2 mg), Intravenous,  Once, 3 of 6 cycles Dose modification:   (original dose 497.2 mg, Cycle 1, Reason: Provider Judgment, Comment: difficulty with counts with initial chemo in [place]; advance dose as tolerated),   (original dose 497.2 mg, Cycle 5, Reason: Provider Judgment, Comment: return back to original dose) Administration: 500 mg (06/04/2018), 560 mg (06/25/2018), 500 mg (07/23/2018) PACLitaxel (TAXOL) 264 mg in sodium chloride 0.9 % 250 mL chemo infusion (> 88m/m2), 140 mg/m2 = 264 mg (100 % of original dose 140 mg/m2), Intravenous,  Once, 3 of 6  cycles Dose  modification: 140 mg/m2 (original dose 140 mg/m2, Cycle 1, Reason: Provider Judgment, Comment: difficulty with counts with initial chemo in [place]; advance dose as tolerated), 155 mg/m2 (original dose 140 mg/m2, Cycle 2, Reason: Provider Judgment, Comment: advance as tolerated), 155 mg/m2 (original dose 175 mg/m2, Cycle 5, Reason: Provider Judgment, Comment: continue current dose) Administration: 264 mg (06/04/2018), 294 mg (06/25/2018), 294 mg (07/23/2018)  for chemotherapy treatment.      Interval history- Shanterria Franta, 52 year old female with above history of metastatic cervical cancer who presents to symptom management clinic for diarrhea.  She was seen in symptom management clinic yesterday for same and received IV fluids and electrolyte replacement.  Labs were drawn at that time and hemoglobin and hematocrit noticed to be down trended.  Patient complaining of black mushy stools at that time which is different compared to her history of chronic diarrhea.  Labs and findings discussed with Dr. Allen Norris and Dr. Mike Gip who recommend patient return to clinic today for stool guaiac studies.  She reports that today she continues to have black mushy stools. Feels better after fluids and electrolytes yesterday.    ECOG FS:1 - Symptomatic but completely ambulatory  Review of systems- Review of Systems  Constitutional: Negative.  Negative for chills, fever, malaise/fatigue and weight loss.  HENT: Negative.  Negative for congestion, ear discharge, ear pain, sinus pain, sore throat and tinnitus.   Eyes: Negative.   Respiratory: Negative.  Negative for cough, sputum production and shortness of breath.   Cardiovascular: Negative.  Negative for chest pain, palpitations, orthopnea, claudication and leg swelling.  Gastrointestinal: Positive for blood in stool, diarrhea and melena. Negative for abdominal pain, constipation, heartburn, nausea and vomiting.  Genitourinary: Negative.   Musculoskeletal:  Negative.   Skin: Negative.   Neurological: Negative.  Negative for dizziness, tingling, weakness and headaches.  Endo/Heme/Allergies: Negative.   Psychiatric/Behavioral: Negative.      Allergies  Allergen Reactions  . Ketamine Anxiety and Other (See Comments)    Syncope episode/confusion     Past Medical History:  Diagnosis Date  . Abnormal cervical Papanicolaou smear 09/18/2017  . Anxiety   . Aortic atherosclerosis ([place])   . Arthritis    neck and knees  . 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 Blood transfusion without reported diagnosis   . Cervical cancer (HCC)    mets lung  . Chronic anal fissure   . Chronic diarrhea   . Dyspnea   . Factor V Leiden mutation ([place])   . Fecal incontinence   . Genital warts   . GERD (gastroesophageal reflux disease)   . Heart murmur   . Hemorrhoids   . 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 History of cancer chemotherapy    completed 06/2017  . History of Clostridium difficile infection    while undergoing chemo.  Negative test 10/2017  . 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 Intestinal infection due to Clostridium difficile 09/18/2017  . Macrocytic anemia with vitamin B12 deficiency   . Perianal condylomata   . Pneumonia    History of  . Pulmonary nodules   . Rectal bleeding   . Small bowel obstruction ([place]) 08/2017  . Stiff neck    limited right turn  . Vitamin D deficiency     Past Surgical History:  Procedure Laterality Date  . CHOLECYSTECTOMY    .  COLON SURGERY  08/2017   resection  . COLONOSCOPY WITH PROPOFOL N/A 12/20/2017   Procedure: COLONOSCOPY WITH PROPOFOL;  Surgeon: [name], MD;  Location: [facility] ENDOSCOPY;  Service: Gastroenterology;  Laterality: N/A;  . COLONOSCOPY WITH PROPOFOL N/A 07/30/2018   Procedure: COLONOSCOPY WITH PROPOFOL;  Surgeon: [name], MD;  Location[facility] ENDOSCOPY;  Service: Gastroenterology;  Laterality: N/A;    . DIAGNOSTIC LAPAROSCOPY    . ESOPHAGOGASTRODUODENOSCOPY (EGD) WITH PROPOFOL N/A 12/20/2017   Procedure: ESOPHAGOGASTRODUODENOSCOPY (EGD) WITH PROPOFOL;  Surgeon: [name], MD;  Location: [place];  Service: Gastroenterology;  Laterality: N/A;  . ESOPHAGOGASTRODUODENOSCOPY (EGD) WITH PROPOFOL  07/30/2018   Procedure: ESOPHAGOGASTRODUODENOSCOPY (EGD) WITH PROPOFOL;  Surgeon: [name], MD;  Location: [facility] ENDOSCOPY;  Service: Gastroenterology;;  . LAPAROTOMY N/A 08/31/2017   Procedure: EXPLORATORY LAPAROTOMY for SBO, ileocolectomy, removal of piece of uterine wall;  Surgeon: [name], MD;  Location: [facility] ORS;  Service: General;  Laterality: N/A;  . LASER ABLATION CONDOLAMATA N/A 02/22/2018   Procedure: LASER ABLATION/REMOVAL OF ZOXWRUEAVWU AROUND ANUS AND VAGINA;  Surgeon: Michael Boston, MD;  Location: [place];  Service: General;  Laterality: N/A;  . OOPHORECTOMY    . PORTA CATH INSERTION N/A 05/13/2018   Procedure: PORTA CATH INSERTION;  Surgeon: Katha Cabal, MD;  Location: [place] CV LAB;  Service: Cardiovascular;  Laterality: N/A;  . SMALL INTESTINE SURGERY    . TANDEM RING INSERTION     x3  . THORACOTOMY    . TOTAL KNEE ARTHROPLASTY Left 04/24/2018   Procedure: TOTAL KNEE ARTHROPLASTY;  Surgeon: Lovell Sheehan, MD;  Location: [facility] ORS;  Service: Orthopedics;  Laterality: Left;    Social History   Socioeconomic History  . Marital status: Legally Separated    Spouse name: Not on file  . Number of children: Not on file  . Years of education: Not on file  . Highest education level: Not on file  Occupational History  . Not on file  Social Needs  . Financial resource strain: Not hard at all  . Food insecurity:    Worry: Never true    Inability: Never true  . Transportation needs:    Medical: No    Non-medical: No  Tobacco Use  . Smoking status: Former Smoker    Last attempt to quit: 10/16/2006    Years since quitting:  11.8  . Smokeless tobacco: Never Used  Substance and Sexual Activity  . Alcohol use: Yes    Frequency: Never    Comment: seldom  . Drug use: Yes    Types: Marijuana  . Sexual activity: Not Currently    Birth control/protection: Post-menopausal    Comment: Not Asked  Lifestyle  . Physical activity:    Days per week: Patient refused    Minutes per session: Patient refused  . Stress: Only a little  Relationships  . Social connections:    Talks on phone: Patient refused    Gets together: Patient refused    Attends religious service: Patient refused    Active member of club or organization: Patient refused    Attends meetings of clubs or organizations: Patient refused    Relationship status: Patient refused  . Intimate partner violence:    Fear of current or ex partner: No    Emotionally abused: No    Physically abused: No    Forced sexual activity: No  Other Topics Concern  . Not on file  Social History Narrative  . Not  on file    Family History  Problem Relation Age of Onset  . Hypertension Father   . Diabetes Father   . Alcohol abuse Daughter   . Hypertension Maternal Grandmother   . Diabetes Maternal Grandmother   . Diabetes Paternal Grandmother   . Hypertension Paternal Grandmother      Current Outpatient Medications:  .  amoxicillin (AMOXIL) 500 MG capsule, Take 1 capsule (500 mg total) by mouth 3 (three) times daily for 6 days., Disp: 18 capsule, Rfl: 0 .  budesonide (ENTOCORT EC) 3 MG 24 hr capsule, Take 3 capsules (9 mg total) by mouth daily., Disp: 90 capsule, Rfl: 2 .  Calcium Carb-Cholecalciferol (CALCIUM 500 +D) 500-400 MG-UNIT TABS, Take 2 tablets by mouth daily., Disp: , Rfl:  .  colestipol (COLESTID) 1 g tablet, Take 2 tablets (2 g total) by mouth 3 (three) times daily with meals., Disp: 180 tablet, Rfl: 0 .  cyanocobalamin (,VITAMIN B-12,) 1000 MCG/ML injection, Inject 1ML every week x 4 weeks, then 1ML every other week x 2 months, then monthly x 3  months (Patient taking differently: Inject 1,000 mcg into the muscle every 30 (thirty) days. Inject 1ML every week x 4 weeks, then 1ML every other week x 2 months, then monthly x 3 months), Disp: 10 mL, Rfl: 1 .  dicyclomine (BENTYL) 20 MG tablet, Take 1 tablet (20 mg total) by mouth every 6 (six) hours. (Patient taking differently: Take 20 mg by mouth every 6 (six) hours. Taking as needed), Disp: 20 tablet, Rfl: 0 .  Eluxadoline (VIBERZI) 100 MG TABS, Take 1 tablet (100 mg total) by mouth 2 (two) times daily with a meal., Disp: 60 tablet, Rfl: 0 .  HYDROcodone-acetaminophen (NORCO/VICODIN) 5-325 MG tablet, Take 1 tablet by mouth every 6 (six) hours as needed for moderate pain., Disp: 20 tablet, Rfl: 0 .  loratadine (CLARITIN) 10 MG tablet, Take 10 mg by mouth daily., Disp: , Rfl:  .  Multiple Vitamins-Minerals (MULTIVITAMIN WITH MINERALS) tablet, Take 1 tablet by mouth daily., Disp: 30 tablet, Rfl: 0 .  OLANZapine zydis (ZYPREXA) 5 MG disintegrating tablet, Take 1 tablet (5 mg total) by mouth at bedtime., Disp: 30 tablet, Rfl: 0 .  ondansetron (ZOFRAN) 8 MG tablet, Take 1 tablet (8 mg total) by mouth every 8 (eight) hours as needed for nausea or vomiting., Disp: 30 tablet, Rfl: 0 .  Opium 10 MG/ML (1%) TINC, Take 1 mL (10 mg total) by mouth 3 (three) times daily with meals., Disp: 473 mL, Rfl: 0 .  pantoprazole (PROTONIX) 40 MG tablet, Take 1 tablet (40 mg total) by mouth daily., Disp: 30 tablet, Rfl: 0 .  promethazine (PHENERGAN) 25 MG tablet, Take 1 tablet (25 mg total) by mouth every 6 (six) hours as needed for nausea or vomiting., Disp: 30 tablet, Rfl: 0 .  rivaroxaban (XARELTO) 20 MG TABS tablet, Take 1 tablet (20 mg total) by mouth daily with supper., Disp: 30 tablet, Rfl: 1 .  sertraline (ZOLOFT) 50 MG tablet, Take 1 tablet (50 mg total) by mouth daily., Disp: 30 tablet, Rfl: 3 .  Syringe/Needle, Disp, (SYRINGE 3CC/22GX1") 22G X 1" 3 ML MISC, For use with Vitamin B12 injections, Disp: 50 each,  Rfl: 0 .  traZODone (DESYREL) 50 MG tablet, Take 0.5-1 tablets (25-50 mg total) by mouth at bedtime as needed for sleep., Disp: 30 tablet, Rfl: 0 .  VENTOLIN HFA 108 (90 Base) MCG/ACT inhaler, Inhale 1-2 puffs into the lungs every 4 (four) hours as needed  for shortness of breath. , Disp: , Rfl: 0 No current facility-administered medications for this visit.   Facility-Administered Medications Ordered in Other Visits:  .  heparin lock flush 100 unit/mL, 500 Units, Intravenous, Once, Lequita Asal, MD  Physical exam:  Vitals:   08/16/18 1137  BP: 132/88  Pulse: 78  Resp: 18  Temp: 98.4 F (36.9 C)  TempSrc: Tympanic   Physical Exam  Constitutional: She is oriented to person, place, and time. She appears well-developed and well-nourished.  HENT:  Head: Atraumatic.  Nose: Nose normal.  Mouth/Throat: Oropharynx is clear and moist. No oropharyngeal exudate.  Eyes: Conjunctivae are normal. No scleral icterus.  Neck: Normal range of motion.  Cardiovascular: Normal rate, regular rhythm and normal heart sounds.  Pulmonary/Chest: Effort normal and breath sounds normal.  Abdominal: Soft. Bowel sounds are normal. She exhibits no distension. There is no tenderness.  Musculoskeletal: She exhibits no edema.  Neurological: She is alert and oriented to person, place, and time.  Skin: Skin is warm and dry.  Psychiatric: She has a normal mood and affect.     CMP Latest Ref Rng & Units 08/15/2018  Glucose 70 - 99 mg/dL 98  BUN 6 - 20 mg/dL 10  Creatinine 0.44 - 1.00 mg/dL 0.71  Sodium 135 - 145 mmol/L 137  Potassium 3.5 - 5.1 mmol/L 3.4(L)  Chloride 98 - 111 mmol/L 109  CO2 22 - 32 mmol/L 25  Calcium 8.9 - 10.3 mg/dL 8.6(L)  Total Protein 6.5 - 8.1 g/dL 7.1  Total Bilirubin 0.3 - 1.2 mg/dL 0.2(L)  Alkaline Phos 38 - 126 U/L 151(H)  AST 15 - 41 U/L 28  ALT 0 - 44 U/L 41   CBC Latest Ref Rng & Units 08/15/2018  WBC 4.0 - 10.5 K/uL 3.1(L)  Hemoglobin 12.0 - 15.0 g/dL 8.3(L)    Hematocrit 36.0 - 46.0 % 25.8(L)  Platelets 150 - 400 K/uL 134(L)    No images are attached to the encounter.  Ct Abdomen Pelvis W Contrast  Result Date: 07/27/2018 CLINICAL DATA:  Pt with generalized abd/pelvic pain and N/V/D since last night. Pt states cervical ca and is having treatments ^177m ISOVUE-300 IOPAMIDOL (ISOVUE-300) INJECTION 61% EXAM: CT ABDOMEN AND PELVIS WITH CONTRAST TECHNIQUE: Multidetector CT imaging of the abdomen and pelvis was performed using the standard protocol following bolus administration of intravenous contrast. CONTRAST:  1051mISOVUE-300 IOPAMIDOL (ISOVUE-300) INJECTION 61% COMPARISON:  None. FINDINGS: Lower chest: Multiple small nodules again appreciated at the bilateral lung bases, some cavitary, compatible with previously described known metastatic disease. No new findings at either lung base. No pericardial effusion at the heart base. Hepatobiliary: No focal liver abnormality is seen. Status post cholecystectomy. No biliary dilatation. Pancreas: Unremarkable. No pancreatic ductal dilatation or surrounding inflammatory changes. Spleen: Normal in size without focal abnormality. Adrenals/Urinary Tract: Adrenal glands appear normal. Kidneys appear normal without mass, stone or hydronephrosis. Bladder appears normal. Stomach/Bowel: Status post RIGHT hemicolectomy. There is thickening of the walls of the entire colon, most prominent within the transverse colon, indicating colitis. No dilated large or small bowel loops. Stomach is unremarkable. Vascular/Lymphatic: Aortic atherosclerosis. No enlarged abdominal or pelvic lymph nodes. Reproductive: No adnexal mass or free fluid. Other: Fluid stranding within the lower pelvis, likely treatment related. No abscess collection seen. No free intraperitoneal air. Musculoskeletal: No acute or suspicious osseous finding. Mild degenerative spondylitic changes throughout the slightly scoliotic thoracolumbar spine. IMPRESSION: 1. Diffuse  thickening of the walls of the entire colon, most prominent within the  transverse colon and worsened thickening of the walls of the transverse colon compared to earlier episodes, consistent with recurrent colitis of infectious or inflammatory nature, possibly radiation related. 2. No acute or significant findings elsewhere within the abdomen or pelvis. No bowel obstruction. No abscess collection. No free intraperitoneal air. No evidence of acute solid organ abnormality. 3. Stable small nodules at the lung bases, some cavitary, compatible with previously described known metastatic disease. 4. Aortic Atherosclerosis (ICD10-I70.0). Electronically Signed   By: Franki Cabot M.D.   On: 07/27/2018 13:42    Assessment and plan- Patient is a 52 y.o. female diagnosed with metastatic cervical cancer who presents to symptom management clinic for diarrhea.  1.  Recurrent/metastatic cervical cancer-originally diagnosed with stage I B1 s/p concurrent cisplatin and external beam radiation followed by vaginal brachytherapy at [facility].  PET on 06/2017 showed enlarging pulmonary nodules.  On 08/31/2017 she underwent right ileocolectomy for small bowel obstruction.  Biopsy of left upper lobe pulmonary nodule confirmed metastatic adenocarcinoma consistent with cervical adenocarcinoma.  Initiated carboplatin-taxol on 06/04/2018, now s/p cycle 3 on 07/23/2018.  Receiving Udencya support.   2. Acute on Chronic Diarrhea with anemia- recent upper endoscopy and colonoscopy unremarkable.  Per patient diarrhea has changed in consistency and is now black and mushy/watery (previously dark brown to brown & watery).  Discussed yesterday with Dr. Allen Norris (GI) who recommended stool guaiac today.  Performed and reported as positive.  Per his recommendation I started her on Viberzi (prior Auth completed).  Discussed with Dr. Allen Norris who recommends holding anticoagulation (ok'd by Dr. Mike Gip) and having patient rtc on 08/19/18 for repeat labs and will set  her up for repeat EGD. May consider adding Lotronex in future. ER precautions provided.   3.  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  Hypercoagulable work-up on 09/14/2017 revealed single are 506Q mutation.  Testing was negative for prothrombin gene mutation, lupus anticoagulant panel, anticardiolipin antibodies, and beta-2 glycoprotein antibodies.  On Xarelto.  Anticoagulation has previously been held for several procedures and she has tolerated well.  Recommendation to hold anticoagulation from Dr. Allen Norris given above findings. Discussed with Dr. Mike Gip who agrees and recommends holding Xarelto and rtc on 08/19/18 for repeat labs and re-evaluation. Risks vs benefits discussed with patient who agrees to hold.   Start Viberzi. Hold Xarelto. rtc on 08/19/18 for labs & re-evaluation. Will set up for EGD at that time. ER precautions provided.     Visit Diagnosis 1. 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 Chronic blood loss anemia   3. Chronic diarrhea   4. 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 She also understands that She can call clinic at any time with any questions, concerns, or complaints.   Thank you for allowing me to participate in the care of this very pleasant patient.   Beckey Rutter, DNP, AGNP-C [place] at [facility] [phone] (work cell) [phone] (office)

## 2018-08-19 ENCOUNTER — Telehealth: Payer: Self-pay | Admitting: Nurse Practitioner

## 2018-08-19 ENCOUNTER — Encounter: Payer: Self-pay | Admitting: Nurse Practitioner

## 2018-08-19 ENCOUNTER — Encounter: Payer: Self-pay | Admitting: Gastroenterology

## 2018-08-19 ENCOUNTER — Ambulatory Visit (INDEPENDENT_AMBULATORY_CARE_PROVIDER_SITE_OTHER): Payer: Medicaid Other | Admitting: Gastroenterology

## 2018-08-19 ENCOUNTER — Other Ambulatory Visit
Admission: RE | Admit: 2018-08-19 | Discharge: 2018-08-19 | Disposition: A | Discharge status: Home / Self Care | Payer: Medicaid Other | Source: Ambulatory Visit | Attending: Gastroenterology | Admitting: Gastroenterology

## 2018-08-19 ENCOUNTER — Telehealth: Payer: Self-pay | Admitting: *Deleted

## 2018-08-19 VITALS — BP 186/122 | HR 73 | Resp 17 | Wt 169.0 lb

## 2018-08-19 DIAGNOSIS — Q8789 Other specified congenital malformation syndromes, not elsewhere classified: Secondary | ICD-10-CM | POA: Diagnosis not present

## 2018-08-19 DIAGNOSIS — K529 Noninfective gastroenteritis and colitis, unspecified: Secondary | ICD-10-CM

## 2018-08-19 DIAGNOSIS — K6389 Other specified diseases of intestine: Secondary | ICD-10-CM

## 2018-08-19 MED ORDER — AMITRIPTYLINE HCL 50 MG PO TABS: 50.0000 mg | ORAL_TABLET | Freq: Every day | ORAL | 2 refills | Status: DC

## 2018-08-19 NOTE — Telephone Encounter (Signed)
  OK.  Also needs an appointment with Dr Marius Ditch.  M

## 2018-08-19 NOTE — Telephone Encounter (Signed)
Patient called to report that she is still having diarrhea. Would like an appointment with Symptom Management.

## 2018-08-19 NOTE — Telephone Encounter (Signed)
Called patient to f/u on mychart message and her request for [facility] appt. Dr. Mike Gip would like for her to be seen by GI. I've asked patient to contact Dr. Marius Ditch for appointment.

## 2018-08-19 NOTE — Progress Notes (Signed)
Joanna Darby, MD [address]  [place]  [place], [place] [address]  Main: [phone]  Fax: [phone]    Gastroenterology Consultation  Referring Provider:     Arnetha Courser, MD Primary Care Physician:  Arnetha Courser, MD Primary Gastroenterologist:  Dr. Cephas Hall Reason for Consultation:     Chronic diarrhea and lower abdominal pain        HPI:   Joanna Hall is a 52 y.o. female referred by Dr. Hampton Abbot for consultation & management of and chronic unexplained diarrhea. Patient has history of cervical cancer underwent chemotherapy XRT and brachytherapy, appears to be stable, closely followed by oncology at [facility]. She had small bowel obstruction in 08/2017, underwent exploratory laparoscopy with ileocolectomy and primary anastomosis. 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 Currently on Xarelto  Patient reports that since her bowel resection, she has been experiencing several episodes of nonbloody diarrhea, up to 12 times per day and night associated with urgency, nocturnal diarrhea as well as fecal incontinence. She does report diffuse lower abdominal cramps as well as intermittent gas/bloating.patient reports that she may have seen worms in her stools on several occasions. She does drink well water. She denies recent travel outside the [place] or recent use of antibiotics. She did have C. Difficile infection in the past when she was undergoing chemotherapy for cervical cancer. She had stool studies for C. Difficile and other GI pathogens including ova and parasites in 10/2017 by Dr. Hampton Abbot and they were negative.She has tried Imodium and Lomotil with no relief. Therefore she was referred to GI by Dr. Hampton Abbot for further management. Patient denies any particular relation to food. Prior to her surgery, patient reports having  alternating episodes of diarrhea and constipation associated with abdominal cramps and bloating. She would have up to 2-3 soft bowel movements daily but not this severe.her weight has been stable. She denies nausea, vomiting, fever, night sweats. She denies rectal bleeding. She is found to have macrocytic anemia which has been stable. She is not on any B12 or iron supplements.   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 She does not have any evidence of chronic liver disease.  Follow-up visit 12/12/2017 Patient was admitted to the hospital early this month secondary to lower abdominal pain, CT revealed mild rectosigmoid thickening. She reports that her diarrhea resolved after initiation of cholestyramine. She is currently taking 4 g twice daily. Having one bowel movement daily. She denies rectal bleeding. Her anal fissure had also significantly improved since control of her diarrhea. She has not received topical nitroglycerin ointment. However, she continues to have lower abdominal pain, constant with no relation to food or bowel movements. She tried dicyclomine which did not help. She is seeing Dr. Mike Gip for follow-up of lung nodules and is scheduled to have a CT scan in 02/2018. Her disease has not progressed to date. She also has B12 deficiency for which she is taking B12 injections. She stopped taking NSAIDs  Follow-up visit 02/11/2018 Patient was at her weight has been stable. She feels like she  is dependent on Bentyl and cholestyramine for her diarrhea and abdominal pain. Her diarrhea recurs if she does not take cholestyramine. She takes 2 packets daily which in fact leads to constipation. Bentyl  helps with her chronic lower abdominal cramps. Medication for hepatitis C treatment has been approved, and waiting for the medicine to be shipped. She underwent colonoscopy which showed anal condylomata.  Follow up visit 05/31/2018 Patient underwent left knee replacement at [facility] on 04/24/2018. She also had laser ablation of anal condylomata on 02/22/2018 by Dr. Johney Maine. Both procedures went well. 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 Patient was started on chemotherapy on 05/15/2018. She developed abdominal pain with nausea, vomiting and diarrhea for last 2 weeks. She underwent CT abdomen and pelvis which revealed thickening of the colon, underwent stool studies including C. Difficile and other GI pathogen panel which came back negative. Patient is started on ciprofloxacin 3 days ago. I got a call from Ander Purpura, NP from [place] yesterday about her ongoing diarrhea. I recommended to increase cholestyramine to 3 times daily and add Lomotil. Patient is here as a urgent follow-up of her diarrhea  She took third dose of cholestyramine and took 1 dose of Lomotil last night and she had 1 soft bowel movement this morning. She thinks her diarrhea has slowed down. He otherwise denies any other GI symptoms  Follow-up visit 07/09/2018 Restarted chemotherapy for cervical cancer. She reports that her diarrhea recurred after starting chemotherapy. She reports lower abdominal cramps as well. Currently, experiencing very loose, muddy like bowel movements several times a day and sometimes at night. She is on cholestyramine 2 packets daily, Imodium twice daily, Lomotil twice daily. She had repeat stool studies by Dr. Mike Gip on and negative for infection. Her weight has been stable, she denies nausea,  appetite is intact. She was also taking oral magnesium which has been just stopped  Follow-up visit 08/19/2018 Patient made an urgent visit today due to worsening diarrhea since starting antibiotics for dental infection.  She is currently on amoxicillin.  She reports that for the last 1 month her diarrhea has been intermittent, had good days.  She reports the diarrhea really got worse after starting antibiotics.  Her last chemo was about a month ago.  She is waiting for her WBC count to be improved before starting next cycle of chemo.  She is currently on colestipol 1 pill daily, Viberzi 100 mg twice daily, started 2 days ago, tincture of opium 3 times daily, Lomotil once daily, budesonide 3 mg 3 pills daily.  Patient was hospitalized to [facility] few weeks ago, underwent EGD and colonoscopy.  Colonoscopy was unremarkable including biopsies.  EGD revealed moderate villous blunting on duodenal biopsies. However her TTG IgA was negative in the past.  Therefore, I started her empirically on budesonide She is tearful due to ongoing diarrhea, incontinence  She denies smoking or drinking alcohol.   NSAIDs: Has stopped taking NSAIDs  Antiplts/Anticoagulants/Anti thrombotics: on Xarelto for factor V bleeding mutation  GI Procedures: EGD and colonoscopy 07/30/2018 DIAGNOSIS:  A. DUODENUM; COLD BIOPSY:  - MODERATE DUODENOPATHY WITH VILLOUS BLUNTING, SEE COMMENT.   Comment:  The duodenal biopsies show shortened and shrunken villi with reactive  changes of surface epithelium. Intraepithelial lymphocytes are not  increased. Peptic duodenitis and medication effect should be considered.  The features are not typical for malabsorption, but  clinical correlation  is recommended.   B. NEO-TERMINAL ILEUM; COLD BIOPSY:  - ILEAL MUCOSA WITH INTACT VILLI.  - NEGATIVE FOR ACTIVE INFLAMMATION, INTRAEPITHELIAL LYMPHOCYTOSIS,  INFECTIOUS AGENTS, AND GRANULOMAS.  - NEGATIVE FOR DYSPLASIA AND MALIGNANCY.   C. COLON;  RANDOM COLD BIOPSY:  - NONSPECIFIC CRYPT HYPERPLASIA.  - NEGATIVE FOR ACTIVE INFLAMMATION AND MICROSCOPIC COLITIS.  - NEGATIVE FOR DYSPLASIA AND MALIGNANCY.   EGD 12/30/2017 - Normal duodenal bulb and second portion of the duodenum. - Erythematous mucosa in the stomach. Biopsied. - Normal gastroesophageal junction and esophagus.  Colonoscopy 12/30/2017 - Perianal condylomata found on perianal exam. - Patent end-to-side ileo-colonic anastomosis, characterized by healthy appearing mucosa. - The examined portion of the ileum was normal. Biopsied. - Multiple non-bleeding colonic angioectasias. - Normal mucosa in the entire examined colon. Biopsied. DIAGNOSIS:  A. STOMACH, ANTRUM; COLD BIOPSY:  - MILD CHRONIC GASTRITIS AND REGENERATIVE/REPARATIVE CHANGE.  - NEGATIVE FOR H. PYLORI, DYSPLASIA AND MALIGNANCY.   B. STOMACH, BODY; COLD BIOPSY:  - MINIMAL CHRONIC GASTRITIS AND REGENERATIVE/REPARATIVE CHANGE.  - NEGATIVE FOR H. PYLORI, DYSPLASIA AND MALIGNANCY.   Note: The histologic findings in the antrum and body resemble reactive  gastropathy. Etiologies include drugs/chemical injury (NSAIDs vs.  other), bile reflux, and changes adjacent to an area of healing  ulceration. Clinical correlation with endoscopic findings is required.   C. TERMINAL ILEUM; COLD BIOPSY:  - PARTIALLY FRAGMENTED SMALL BOWEL MUCOSA WITH OVERALL INTACT VILLOUS  ARCHITECTURE.  - NEGATIVE FOR INTRAEPITHELIAL LYMPHOCYTOSIS, DYSPLASIA AND MALIGNANCY.   D. RANDOM COLON; COLD BIOPSY:  - COLONIC MUCOSA NEGATIVE FOR MICROSCOPIC COLITIS, DYSPLASIA AND  MALIGNANCY.   Colonoscopy at [facility] 10/05/2016 Impression:           - One diminutive polyp in the cecum,                        removed with a cold biopsy forceps.                        Resected and retrieved.                       - One 5 mm polyp in the cecum,                        removed with a jumbo cold forceps.                        Resected and  retrieved.                       - One 6 mm polyp in the cecum,                        removed with a hot snare. Resected                        and retrieved.                       - One 5 mm polyp in the descending                        colon, removed with a jumbo cold  forceps. Resected and retrieved. Date Collected: 10/05/2016 14:48 Diagnosis A.COLON, LABELED AS "CECAL POLYP", BIOPSY:  TUBULAR ADENOMA  SESSILE SERRATED ADENOMA  B.COLON, LABELED AS "TRANSVERSE COLON POLYP", BIOPSY:  TUBULAR ADENOMA  She denies family history of celiac disease, autoimmune conditions, IBD, GI malignancy  Past Medical History:  Diagnosis Date  . Abnormal cervical Papanicolaou smear 09/18/2017  . Anxiety   . Aortic atherosclerosis ([place])   . Arthritis    neck and knees  . 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 Blood transfusion without reported diagnosis   . Cervical cancer (H[facility])    mets lung  . Chronic anal fissure   . Chronic diarrhea   . Dyspnea   . Factor V Leiden mutation ([place])   . Fecal incontinence   . Genital warts   . GERD (gastroesophageal reflux disease)   . Heart murmur   . Hemorrhoids   . 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 History of cancer chemotherapy    completed 06/2017  . History of Clostridium difficile infection    while undergoing chemo.  Negative test 10/2017  . 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 Intestinal infection due to Clostridium difficile 09/18/2017  . Macrocytic anemia with vitamin B12 deficiency   . Perianal condylomata   . Pneumonia    History of  . Pulmonary nodules   . Rectal bleeding   . Small bowel obstruction ([place]) 08/2017  . Stiff neck    limited right turn  . Vitamin D deficiency     Past Surgical History:  Procedure Laterality Date  . CHOLECYSTECTOMY    . COLON SURGERY  08/2017   resection  . COLONOSCOPY WITH PROPOFOL N/A 12/20/2017    Procedure: COLONOSCOPY WITH PROPOFOL;  Surgeon: [name], MD;  Location: [facility] ENDOSCOPY;  Service: Gastroenterology;  Laterality: N/A;  . COLONOSCOPY WITH PROPOFOL N/A 07/30/2018   Procedure: COLONOSCOPY WITH PROPOFOL;  Surgeon: [name], MD;  Location: [facility] ENDOSCOPY;  Service: Gastroenterology;  Laterality: N/A;  . DIAGNOSTIC LAPAROSCOPY    . ESOPHAGOGASTRODUODENOSCOPY (EGD) WITH PROPOFOL N/A 12/20/2017   Procedure: ESOPHAGOGASTRODUODENOSCOPY (EGD) WITH PROPOFOL;  Surgeon: [name], MD;  Location: [place];  Service: Gastroenterology;  Laterality: N/A;  . ESOPHAGOGASTRODUODENOSCOPY (EGD) WITH PROPOFOL  07/30/2018   Procedure: ESOPHAGOGASTRODUODENOSCOPY (EGD) WITH PROPOFOL;  Surgeon: [name], MD;  Location: [facility] ENDOSCOPY;  Service: Gastroenterology;;  . LAPAROTOMY N/A 08/31/2017   Procedure: EXPLORATORY LAPAROTOMY for SBO, ileocolectomy, removal of piece of uterine wall;  Surgeon: [name], MD;  Location: [facility] ORS;  Service: General;  Laterality: N/A;  . LASER ABLATION CONDOLAMATA N/A 02/22/2018   Procedure: LASER ABLATION/REMOVAL OF [id number] AROUND ANUS AND VAGINA;  Surgeon: Michael Boston, MD;  Location: [place];  Service: General;  Laterality: N/A;  . OOPHORECTOMY    . PORTA CATH INSERTION N/A 05/13/2018   Procedure: PORTA CATH INSERTION;  Surgeon: Katha Cabal, MD;  Location: [place] [facility];  Service: Cardiovascular;  Laterality: N/A;  . SMALL INTESTINE SURGERY    . TANDEM RING INSERTION     x3  . THORACOTOMY    . TOTAL KNEE ARTHROPLASTY Left 04/24/2018   Procedure: TOTAL KNEE ARTHROPLASTY;  Surgeon: Lovell Sheehan, MD;  Location: [facility] ORS;  Service: Orthopedics;  Laterality: Left;    Current Outpatient Medications:  .  amoxicillin (AMOXIL) 500 MG capsule, Take 1  capsule (500 mg total) by mouth 3 (three) times daily for 6 days., Disp: 18 capsule, Rfl: 0 .  budesonide (ENTOCORT EC) 3 MG 24 hr capsule,  Take 3 capsules (9 mg total) by mouth daily., Disp: 90 capsule, Rfl: 2 .  Calcium Carb-Cholecalciferol (CALCIUM 500 +D) 500-400 MG-UNIT TABS, Take 2 tablets by mouth daily., Disp: , Rfl:  .  colestipol (COLESTID) 1 g tablet, Take 2 tablets (2 g total) by mouth 3 (three) times daily with meals., Disp: 180 tablet, Rfl: 0 .  cyanocobalamin (,VITAMIN B-12,) 1000 MCG/ML injection, Inject 1ML every week x 4 weeks, then 1ML every other week x 2 months, then monthly x 3 months (Patient taking differently: Inject 1,000 mcg into the muscle every 30 (thirty) days. Inject 1ML every week x 4 weeks, then 1ML every other week x 2 months, then monthly x 3 months), Disp: 10 mL, Rfl: 1 .  dicyclomine (BENTYL) 20 MG tablet, Take 1 tablet (20 mg total) by mouth every 6 (six) hours. (Patient taking differently: Take 20 mg by mouth every 6 (six) hours. Taking as needed), Disp: 20 tablet, Rfl: 0 .  Eluxadoline (VIBERZI) 100 MG TABS, Take 1 tablet (100 mg total) by mouth 2 (two) times daily with a meal., Disp: 60 tablet, Rfl: 0 .  HYDROcodone-acetaminophen (NORCO/VICODIN) 5-325 MG tablet, Take 1 tablet by mouth every 6 (six) hours as needed for moderate pain., Disp: 20 tablet, Rfl: 0 .  Multiple Vitamins-Minerals (MULTIVITAMIN WITH MINERALS) tablet, Take 1 tablet by mouth daily., Disp: 30 tablet, Rfl: 0 .  OLANZapine zydis (ZYPREXA) 5 MG disintegrating tablet, Take 1 tablet (5 mg total) by mouth at bedtime., Disp: 30 tablet, Rfl: 0 .  ondansetron (ZOFRAN) 8 MG tablet, Take 1 tablet (8 mg total) by mouth every 8 (eight) hours as needed for nausea or vomiting., Disp: 30 tablet, Rfl: 0 .  Opium 10 MG/ML (1%) TINC, Take 1 mL (10 mg total) by mouth 3 (three) times daily with meals., Disp: 473 mL, Rfl: 0 .  pantoprazole (PROTONIX) 40 MG tablet, Take 1 tablet (40 mg total) by mouth daily., Disp: 30 tablet, Rfl: 0 .  promethazine (PHENERGAN) 25 MG tablet, Take 1 tablet (25 mg total) by mouth every 6 (six) hours as needed for nausea or  vomiting., Disp: 30 tablet, Rfl: 0 .  Syringe/Needle, Disp, (SYRINGE 3CC/22GX1") 22G X 1" 3 ML MISC, For use with Vitamin B12 injections, Disp: 50 each, Rfl: 0 .  traZODone (DESYREL) 50 MG tablet, Take 0.5-1 tablets (25-50 mg total) by mouth at bedtime as needed for sleep., Disp: 30 tablet, Rfl: 0 .  amitriptyline (ELAVIL) 50 MG tablet, Take 1 tablet (50 mg total) by mouth at bedtime., Disp: 30 tablet, Rfl: 2 .  loratadine (CLARITIN) 10 MG tablet, Take 10 mg by mouth daily., Disp: , Rfl:  .  rivaroxaban (XARELTO) 20 MG TABS tablet, Take 1 tablet (20 mg total) by mouth daily with supper. (Patient not taking: Reported on 08/19/2018), Disp: 30 tablet, Rfl: 1 .  VENTOLIN HFA 108 (90 Base) MCG/ACT inhaler, Inhale 1-2 puffs into the lungs every 4 (four) hours as needed for shortness of breath. , Disp: , Rfl: 0 No current facility-administered medications for this visit.   Facility-Administered Medications Ordered in Other Visits:  .  heparin lock flush 100 unit/mL, 500 Units, Intravenous, Once, [place], Drue Second, MD   Family History  Problem Relation Age of Onset  . Hypertension Father   . Diabetes Father   . Alcohol  abuse Daughter   . Hypertension Maternal Grandmother   . Diabetes Maternal Grandmother   . Diabetes Paternal Grandmother   . Hypertension Paternal Grandmother      Social History   Tobacco Use  . Smoking status: Former Smoker    Last attempt to quit: 10/16/2006    Years since quitting: 11.8  . Smokeless tobacco: Never Used  Substance Use Topics  . Alcohol use: Yes    Frequency: Never    Comment: seldom  . Drug use: Yes    Types: Marijuana    Allergies as of 08/19/2018 - Review Complete 08/19/2018  Allergen Reaction Noted  . Ketamine Anxiety and Other (See Comments) 02/22/2017    Review of Systems:    All systems reviewed and negative except where noted in HPI.   Physical Exam:  BP (!) 186/122 (BP Location: Left Arm, Patient Position: Sitting, Cuff Size: Normal)    Pulse 73   Resp 17   Wt 169 lb (76.7 kg)   BMI 26.47 kg/m  No LMP recorded. Patient is postmenopausal.  General:   Alert,  Well-developed, well-nourished, pleasant and cooperative in NAD Head:  Normocephalic and atraumatic. Eyes:  Sclera clear, no icterus.   Conjunctiva pink. Ears:  Normal auditory acuity. Nose:  No deformity, discharge, or lesions. Mouth:  No deformity or lesions,oropharynx pink & moist. Neck:  Supple; no masses or thyromegaly. Lungs:  Respirations even and unlabored.  Clear throughout to auscultation.   No wheezes, crackles, or rhonchi. No acute distress. Heart:  Regular rate and rhythm; no murmurs, clicks, rubs, or gallops. Abdomen:  Normal bowel sounds. Soft,mild diffuse lower abdominal tenderness, and non-distended without masses, hepatosplenomegaly or hernias noted.  No guarding or rebound tenderness.   Rectal: Not performed Msk:  Symmetrical without gross deformities. Good, equal movement & strength bilaterally. Pulses:  Normal pulses noted. Extremities:  No clubbing or edema.  No cyanosis. Neurologic:  Alert and oriented x3;  grossly normal neurologically. Skin:  Intact without significant lesions or rashes. No jaundice. Lymph Nodes:  No significant cervical adenopathy. Psych:  Alert and cooperative. Normal mood and affect.  Imaging Studies: reviewed  Assessment and Plan:   Joanna Hall is a 52 y.o. female with history of chronic hepatitis C, treatment nave, cervical adenocarcinoma, status post chemotherapy XRT and brachytherapy, disease currently stable, persistent small bowel obstruction, S/P exploratory laparoscopy with ileocolectomy in 08/2017 seen in follow-up for chronic hepatitis C, chronic unexplained diarrhea, chronic anal fissure as well as symptomatic hemorrhoids  Chronic hep C: Genotype 2a/2c, VL 82,400  Start treatment of hepatitis C with Mavyret for 8 weeks, Patient is waiting to finish next cycle of chemotherapy so that the antiviral  treatment is uninterrupted There is no evidence of chronic liver disease She had alkaline phosphatase Isoenzymes and liver component is elevated, GGT 209, elevated Hepatitis B surface antigen negative HIV nonreactive hepatitis B surface antibody, core antibody negative Other secondary causes for elevated LFTs are unremarkable  Chronic nonbloody diarrhea: Her history is consistent with bile salt induced diarrhea with history of ileocolectomy. Pancreatic fecal elastase, fecal calprotectin are normal, CRP came back normal. No evidence of microscopic colitis. Worsening of diarrhea after chemotherapy, recent EGD revealed a moderate villous blunting on the duodenal biopsies.  I still think her diarrhea is multifactorial given resection and chemotherapy.  Now, on antibiotics  -Trial of probiotics, VSL#3, samples provided as patient is currently on antibiotic -Increase colestipol 1 pill each meal -Dicyclomine for abdominal cramps   -Increase Lomotil to  3 times daily -Continue to ensure appropriate 3 times daily -Continue budesonide 3 mg 3 capsules daily -Start amitriptyline 50 mg at bedtime -Check VIP, cortisol, gastrin, 5 HIAA, chromogranin levels, DGP  Chronic anal fissure and symptomatic hemorrhoids: - Her fissure has significantly improved - I will defer outpatient hemorrhoid ligation for now - she underwent removal of anal condylomata  Follow up in 1 month or sooner if diarrhea persists  Joanna Darby, MD

## 2018-08-20 ENCOUNTER — Inpatient Hospital Stay: Payer: Medicaid Other

## 2018-08-20 ENCOUNTER — Telehealth: Payer: Self-pay | Admitting: Family Medicine

## 2018-08-20 ENCOUNTER — Telehealth: Payer: Self-pay | Admitting: *Deleted

## 2018-08-20 ENCOUNTER — Inpatient Hospital Stay (HOSPITAL_BASED_OUTPATIENT_CLINIC_OR_DEPARTMENT_OTHER): Payer: Medicaid Other | Admitting: Hematology and Oncology

## 2018-08-20 ENCOUNTER — Other Ambulatory Visit: Payer: Self-pay

## 2018-08-20 VITALS — BP 139/84 | HR 70 | Temp 95.5°F | Resp 18

## 2018-08-20 VITALS — BP 129/83 | HR 82 | Temp 96.3°F | Resp 18 | Wt 168.4 lb

## 2018-08-20 DIAGNOSIS — Z7901 Long term (current) use of anticoagulants: Secondary | ICD-10-CM

## 2018-08-20 DIAGNOSIS — E876 Hypokalemia: Secondary | ICD-10-CM

## 2018-08-20 DIAGNOSIS — Z87891 Personal history of nicotine dependence: Secondary | ICD-10-CM

## 2018-08-20 DIAGNOSIS — D500 Iron deficiency anemia secondary to blood loss (chronic): Secondary | ICD-10-CM | POA: Diagnosis not present

## 2018-08-20 DIAGNOSIS — C7800 Secondary malignant neoplasm of unspecified lung: Secondary | ICD-10-CM

## 2018-08-20 DIAGNOSIS — E538 Deficiency of other specified B group vitamins: Secondary | ICD-10-CM

## 2018-08-20 DIAGNOSIS — D6851 Activated protein C resistance: Secondary | ICD-10-CM

## 2018-08-20 DIAGNOSIS — K529 Noninfective gastroenteritis and colitis, unspecified: Secondary | ICD-10-CM

## 2018-08-20 DIAGNOSIS — Z79899 Other long term (current) drug therapy: Secondary | ICD-10-CM

## 2018-08-20 DIAGNOSIS — C538 Malignant neoplasm of overlapping sites of cervix uteri: Secondary | ICD-10-CM

## 2018-08-20 DIAGNOSIS — C530 Malignant neoplasm of endocervix: Secondary | ICD-10-CM

## 2018-08-20 DIAGNOSIS — R110 Nausea: Secondary | ICD-10-CM

## 2018-08-20 DIAGNOSIS — Z7189 Other specified counseling: Secondary | ICD-10-CM

## 2018-08-20 DIAGNOSIS — Z5111 Encounter for antineoplastic chemotherapy: Secondary | ICD-10-CM | POA: Diagnosis not present

## 2018-08-20 DIAGNOSIS — R197 Diarrhea, unspecified: Secondary | ICD-10-CM

## 2018-08-20 DIAGNOSIS — E860 Dehydration: Secondary | ICD-10-CM

## 2018-08-20 LAB — CBC WITH DIFFERENTIAL/PLATELET
Abs Immature Granulocytes: 0.01 10*3/uL (ref 0.00–0.07)
Basophils Absolute: 0 10*3/uL (ref 0.0–0.1)
Basophils Relative: 0 %
Eosinophils Absolute: 0 10*3/uL (ref 0.0–0.5)
Eosinophils Relative: 1 %
HCT: 29.2 % — ABNORMAL LOW (ref 36.0–46.0)
Hemoglobin: 9.5 g/dL — ABNORMAL LOW (ref 12.0–15.0)
Immature Granulocytes: 1 %
Lymphocytes Relative: 25 %
Lymphs Abs: 0.5 10*3/uL — ABNORMAL LOW (ref 0.7–4.0)
MCH: 33.7 pg (ref 26.0–34.0)
MCHC: 32.5 g/dL (ref 30.0–36.0)
MCV: 103.5 fL — ABNORMAL HIGH (ref 80.0–100.0)
Monocytes Absolute: 0.2 10*3/uL (ref 0.1–1.0)
Monocytes Relative: 9 %
Neutro Abs: 1.3 10*3/uL — ABNORMAL LOW (ref 1.7–7.7)
Neutrophils Relative %: 64 %
Platelets: 149 10*3/uL — ABNORMAL LOW (ref 150–400)
RBC: 2.82 MIL/uL — ABNORMAL LOW (ref 3.87–5.11)
RDW: 16.9 % — ABNORMAL HIGH (ref 11.5–15.5)
WBC: 2 10*3/uL — ABNORMAL LOW (ref 4.0–10.5)
nRBC: 0 % (ref 0.0–0.2)

## 2018-08-20 LAB — COMPREHENSIVE METABOLIC PANEL
ALT: 85 U/L — ABNORMAL HIGH (ref 0–44)
AST: 57 U/L — ABNORMAL HIGH (ref 15–41)
Albumin: 3.8 g/dL (ref 3.5–5.0)
Alkaline Phosphatase: 158 U/L — ABNORMAL HIGH (ref 38–126)
Anion gap: 7 (ref 5–15)
BUN: 9 mg/dL (ref 6–20)
CO2: 26 mmol/L (ref 22–32)
Calcium: 9.1 mg/dL (ref 8.9–10.3)
Chloride: 106 mmol/L (ref 98–111)
Creatinine, Ser: 0.64 mg/dL (ref 0.44–1.00)
GFR calc Af Amer: >60 mL/min (ref >60)
GFR calc non Af Amer: >60 mL/min (ref >60)
Glucose, Bld: 96 mg/dL (ref 70–99)
Potassium: 3.8 mmol/L (ref 3.5–5.1)
Sodium: 139 mmol/L (ref 135–145)
Total Bilirubin: 0.4 mg/dL (ref 0.3–1.2)
Total Protein: 7.4 g/dL (ref 6.5–8.1)

## 2018-08-20 LAB — MAGNESIUM: Magnesium: 1.4 mg/dL — ABNORMAL LOW (ref 1.7–2.4)

## 2018-08-20 LAB — CORTISOL: Cortisol, Plasma: 2.7 ug/dL

## 2018-08-20 MED ORDER — MAGNESIUM SULFATE 4 GM/100ML IV SOLN
4.0000 g | Freq: Once | INTRAVENOUS | Status: AC
  Administered 2018-08-20: 4 g via INTRAVENOUS
  Filled 2018-08-20: qty 100

## 2018-08-20 MED ORDER — SODIUM CHLORIDE 0.9 % IV SOLN
Freq: Once | INTRAVENOUS | Status: AC
  Administered 2018-08-20: 11:00:00 via INTRAVENOUS
  Filled 2018-08-20: qty 250

## 2018-08-20 MED ORDER — ONDANSETRON HCL 4 MG/2ML IJ SOLN
8.0000 mg | Freq: Once | INTRAMUSCULAR | Status: AC
  Administered 2018-08-20: 8 mg via INTRAVENOUS

## 2018-08-20 MED ORDER — HEPARIN SOD (PORK) LOCK FLUSH 100 UNIT/ML IV SOLN
500.0000 [IU] | Freq: Once | INTRAVENOUS | Status: AC
  Administered 2018-08-20: 500 [IU] via INTRAVENOUS
  Filled 2018-08-20: qty 5

## 2018-08-20 MED ORDER — ONDANSETRON HCL 4 MG/2ML IJ SOLN
INTRAMUSCULAR | Status: AC
  Filled 2018-08-20: qty 4

## 2018-08-20 MED ORDER — HEPARIN SOD (PORK) LOCK FLUSH 100 UNIT/ML IV SOLN
INTRAVENOUS | Status: AC
  Filled 2018-08-20: qty 5

## 2018-08-20 MED ORDER — SODIUM CHLORIDE 0.9% FLUSH
10.0000 mL | Freq: Once | INTRAVENOUS | Status: AC
  Administered 2018-08-20: 10 mL via INTRAVENOUS
  Filled 2018-08-20: qty 10

## 2018-08-20 NOTE — Telephone Encounter (Signed)
Left detailed VM.  

## 2018-08-20 NOTE — Progress Notes (Signed)
[facility] day:  08/20/2018   Chief Complaint: Joanna Hall is a 52 y.o. female with stage IV cervical cancer who is seen for reassessment prior to cycle #4 carboplatin and Taxol.  HPI:  The patient was last seen in the medical oncology clinic on 08/13/2018.  At that time, she was feeling "ok".  She continued to have diarrhea.  Stools were "black and watery" despite multiple interventions.  Patient denied weakness.  She had diffuse mild abdominal pain.  Nausea was well controlled with currently prescribed interventions. She was eating well; weight up 2 pounds.  Exam was grossly unchanged.  WBC was 3600 ([place] 2600).  Hemoglobin was 8.7, hematocrit 26.9, MCV 103.1, and platelets 147,000.  Potassium was 3.3 mmol/L.  AST 39, ALT 39, ALP 161, and total bilirubin 0.4.  Magnesium was 1.5 mg/dL.  Cycle #4 chemotherapy was held.  She received IVF with potassium and magnesium.  She was seen by Beckey Rutter, NP on 08/15/2018 and 08/16/2018 for ongoing diarrhea.  Stool was black and mushy.  Stool was guaiac +.  She received IVF + electrolytes.  She was started on Viberzi.  Anticoagulation was to be held.  She saw Dr. Marius Ditch on 08/19/2018.  Notes reviewed.  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 Pancreatic fecal elastase, fecal calprotectin were normal, CRP.  There was no evidence of microscopic colitis.  She was felt to have wrsening of diarrhea after chemotherapy.  Recent EGD revealed a moderate villous blunting on the duodenal biopsies.  Diarrhea was felt multifactorial given resection and chemotherapy.  Now, on antibiotics  She was given a trial of probiotics, VSL#3, samples provided .  Increase colestipol 1 pill each meal.  Dicyclomine for abdominal cramps.  Increase Lomotil to 3 times daily.  Continue to ensure appropriate 3 times daily.  Continue budesonide 3 mg 3 capsules daily.  Start  amitriptyline 50 mg at bedtime.  Check VIP, cortisol, gastrin, 5 HIAA, chromogranin levels, DGP.  She underwent removal of anal condylomata.  She has a follow-up in 1 month.  Symptomatically, patient continues to have significant daily diarrhea despite multi-drug management. She began Viberzi on Saturday, however has not appreciated a great deal of improvement. She complains of diffuse abdominal pain and nausea. Nausea, for the most part, is controlled with the prescribed interventions. No B symptoms or recent infections.  Patient remains off of her chronic anticoagulation (rivaroxabad) therapy as directed by gastroenterology Marius Ditch, MD).  Spoke with Dr. Marius Ditch today while patient was in the clinic, and clearance was received for patient to restart rivaroxaban at the discretion of the oncology/hematology clinic providers.  Patient was directed to restart anticoagulation therapy today.  Patient advises that she maintains an adequate appetite. She is eating well. Weight today is 168 lb 7 oz (76.4 kg), which compared to her last visit to the clinic, represents a 2 pound increase.    Patient denies pain in the clinic today    Past Medical History:  Diagnosis Date  . Abnormal cervical Papanicolaou smear 09/18/2017  . Anxiety   . Aortic atherosclerosis ([place])   . Arthritis    neck and knees  . 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 Blood transfusion without reported diagnosis   . Cervical cancer (HCC)    mets lung  . Chronic anal fissure   . Chronic diarrhea   . Dyspnea   .  Factor V Leiden mutation ([place])   . Fecal incontinence   . Genital warts   . GERD (gastroesophageal reflux disease)   . Heart murmur   . Hemorrhoids   . 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 History of cancer chemotherapy    completed 06/2017  . History of Clostridium difficile infection    while undergoing chemo.  Negative test 10/2017  . 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 Intestinal infection due to Clostridium difficile 09/18/2017  . Macrocytic anemia with vitamin B12 deficiency   . Perianal condylomata   . Pneumonia    History of  . Pulmonary nodules   . Rectal bleeding   . Small bowel obstruction ([place]) 08/2017  . Stiff neck    limited right turn  . Vitamin D deficiency     Past Surgical History:  Procedure Laterality Date  . CHOLECYSTECTOMY    . COLON SURGERY  08/2017   resection  . COLONOSCOPY WITH PROPOFOL N/A 12/20/2017   Procedure: COLONOSCOPY WITH PROPOFOL;  Surgeon: [name], MD;  Location: [facility] ENDOSCOPY;  Service: Gastroenterology;  Laterality: N/A;  . COLONOSCOPY WITH PROPOFOL N/A 07/30/2018   Procedure: COLONOSCOPY WITH PROPOFOL;  Surgeon: [name], MD;  Location: [facility] ENDOSCOPY;  Service: Gastroenterology;  Laterality: N/A;  . DIAGNOSTIC LAPAROSCOPY    . ESOPHAGOGASTRODUODENOSCOPY (EGD) WITH PROPOFOL N/A 12/20/2017   Procedure: ESOPHAGOGASTRODUODENOSCOPY (EGD) WITH PROPOFOL;  Surgeon: [name], MD;  Location: [place];  Service: Gastroenterology;  Laterality: N/A;  . ESOPHAGOGASTRODUODENOSCOPY (EGD) WITH PROPOFOL  07/30/2018   Procedure: ESOPHAGOGASTRODUODENOSCOPY (EGD) WITH PROPOFOL;  Surgeon: [name], MD;  Location: [facility] ENDOSCOPY;  Service: Gastroenterology;;  . LAPAROTOMY N/A 08/31/2017   Procedure: EXPLORATORY LAPAROTOMY for SBO, ileocolectomy, removal of piece of uterine wall;  Surgeon: [name], MD;  Location: [facility] ORS;  Service: General;  Laterality: N/A;  . LASER ABLATION CONDOLAMATA N/A 02/22/2018   Procedure: LASER ABLATION/REMOVAL OF [id number] AROUND ANUS AND VAGINA;  Surgeon: Michael Boston, MD;  Location: [place];  Service: General;  Laterality: N/A;  . OOPHORECTOMY    . PORTA CATH INSERTION N/A 05/13/2018   Procedure: PORTA CATH INSERTION;  Surgeon: Katha Cabal, MD;  Location: [place] [facility];  Service: Cardiovascular;   Laterality: N/A;  . SMALL INTESTINE SURGERY    . TANDEM RING INSERTION     x3  . THORACOTOMY    . TOTAL KNEE ARTHROPLASTY Left 04/24/2018   Procedure: TOTAL KNEE ARTHROPLASTY;  Surgeon: Lovell Sheehan, MD;  Location: [facility] ORS;  Service: Orthopedics;  Laterality: Left;    Family History  Problem Relation Age of Onset  . Hypertension Father   . Diabetes Father   . Alcohol abuse Daughter   . Hypertension Maternal Grandmother   . Diabetes Maternal Grandmother   . Diabetes Paternal Grandmother   . Hypertension Paternal Grandmother     Social History:  reports that she quit smoking about 11 years ago. She has never used smokeless tobacco. She reports that she drinks alcohol. She reports that she has current or past drug history. Drug: Marijuana.  The patient's 35 year old daughter died of alcoholism.  The patient is originally from [place].  She moved from [place] to [place].  She is alone today.   Allergies:  Allergies  Allergen Reactions  . Ketamine Anxiety and Other (See Comments)    Syncope episode/confusion  Current Medications: Current Outpatient Medications  Medication Sig Dispense Refill  . amitriptyline (ELAVIL) 50 MG tablet Take 1 tablet (50 mg total) by mouth at bedtime. 30 tablet 2  . budesonide (ENTOCORT EC) 3 MG 24 hr capsule Take 3 capsules (9 mg total) by mouth daily. 90 capsule 2  . Calcium Carb-Cholecalciferol (CALCIUM 500 +D) 500-400 MG-UNIT TABS Take 2 tablets by mouth daily.    . colestipol (COLESTID) 1 g tablet Take 2 tablets (2 g total) by mouth 3 (three) times daily with meals. 180 tablet 0  . cyanocobalamin (,VITAMIN B-12,) 1000 MCG/ML injection Inject 1ML every week x 4 weeks, then 1ML every other week x 2 months, then monthly x 3 months (Patient taking differently: Inject 1,000 mcg into the muscle every 30 (thirty) days. Inject 1ML every week x 4 weeks, then 1ML every other week x 2 months, then monthly x 3 months) 10 mL 1  .  dicyclomine (BENTYL) 20 MG tablet Take 1 tablet (20 mg total) by mouth every 6 (six) hours. (Patient taking differently: Take 20 mg by mouth every 6 (six) hours. Taking as needed) 20 tablet 0  . Eluxadoline (VIBERZI) 100 MG TABS Take 1 tablet (100 mg total) by mouth 2 (two) times daily with a meal. 60 tablet 0  . HYDROcodone-acetaminophen (NORCO/VICODIN) 5-325 MG tablet Take 1 tablet by mouth every 6 (six) hours as needed for moderate pain. 20 tablet 0  . loratadine (CLARITIN) 10 MG tablet Take 10 mg by mouth daily.    . Multiple Vitamins-Minerals (MULTIVITAMIN WITH MINERALS) tablet Take 1 tablet by mouth daily. 30 tablet 0  . OLANZapine zydis (ZYPREXA) 5 MG disintegrating tablet Take 1 tablet (5 mg total) by mouth at bedtime. 30 tablet 0  . ondansetron (ZOFRAN) 8 MG tablet Take 1 tablet (8 mg total) by mouth every 8 (eight) hours as needed for nausea or vomiting. 30 tablet 0  . Opium 10 MG/ML (1%) TINC Take 1 mL (10 mg total) by mouth 3 (three) times daily with meals. 473 mL 0  . pantoprazole (PROTONIX) 40 MG tablet Take 1 tablet (40 mg total) by mouth daily. 30 tablet 0  . promethazine (PHENERGAN) 25 MG tablet Take 1 tablet (25 mg total) by mouth every 6 (six) hours as needed for nausea or vomiting. 30 tablet 0  . Syringe/Needle, Disp, (SYRINGE 3CC/22GX1") 22G X 1" 3 ML MISC For use with Vitamin B12 injections 50 each 0  . traZODone (DESYREL) 50 MG tablet Take 0.5-1 tablets (25-50 mg total) by mouth at bedtime as needed for sleep. 30 tablet 0  . VENTOLIN HFA 108 (90 Base) MCG/ACT inhaler Inhale 1-2 puffs into the lungs every 4 (four) hours as needed for shortness of breath.   0  . rivaroxaban (XARELTO) 20 MG TABS tablet Take 1 tablet (20 mg total) by mouth daily with supper. (Patient not taking: Reported on 08/19/2018) 30 tablet 1   No current facility-administered medications for this visit.    Facility-Administered Medications Ordered in Other Visits  Medication Dose Route Frequency Provider  Last Rate Last Dose  . heparin lock flush 100 unit/mL  500 Units Intravenous Once Corcoran, Melissa C, MD      . heparin lock flush 100 unit/mL  500 Units Intravenous Once Lequita Asal, MD        Review of Systems  Constitutional: Positive for malaise/fatigue. Negative for diaphoresis, fever and weight loss (up 2 pounds).  HENT: Negative.   Eyes: Negative.   Respiratory: Negative  for cough, hemoptysis, sputum production and shortness of breath.   Cardiovascular: Negative for chest pain, palpitations, orthopnea, leg swelling and PND.  Gastrointestinal: Positive for abdominal pain (diffuse), diarrhea, heartburn (on PPI) and nausea (controlled). Negative for blood in stool, constipation, melena and vomiting.  Genitourinary: Negative for dysuria, frequency, hematuria and urgency.  Musculoskeletal: Positive for joint pain and neck pain. Negative for back pain, falls and myalgias.  Skin: Negative for itching and rash.  Neurological: Positive for weakness (generalized). Negative for dizziness, tremors and headaches.  Endo/Heme/Allergies: Does not bruise/bleed easily.  Psychiatric/Behavioral: Negative for depression, memory loss and suicidal ideas. The patient is not nervous/anxious and does not have insomnia.        Tearful in clinic  All other systems reviewed and are negative.  Performance status (ECOG): 1-2  Vital Signs BP 129/83 (BP Location: Left Arm, Patient Position: Sitting)   Pulse 82   Temp (!) 96.3 F (35.7 C) (Tympanic)   Resp 18   Wt 168 lb 7 oz (76.4 kg)   BMI 26.38 kg/m   Physical Exam  Constitutional: She is oriented to person, place, and time and well-developed, well-nourished, and in no distress.  Chronically fatigued appearing  HENT:  Head: Normocephalic and atraumatic.  Mouth/Throat: Oropharynx is clear and moist and mucous membranes are normal. No oropharyngeal exudate.  Wearing a pink wrap.  Eyes: Pupils are equal, round, and reactive to light.  Conjunctivae and EOM are normal. No scleral icterus.  Blue eyes.  Neck: Normal range of motion. Neck supple. No JVD present.  Cardiovascular: Normal rate, regular rhythm, normal heart sounds and intact distal pulses. Exam reveals no gallop and no friction rub.  No murmur heard. Pulmonary/Chest: Effort normal and breath sounds normal. No respiratory distress. She has no wheezes. She has no rales.  Abdominal: Soft. Bowel sounds are normal. She exhibits no distension and no mass. There is generalized abdominal tenderness. There is no rebound and no guarding.  Musculoskeletal: Normal range of motion.        General: No tenderness or edema.  Lymphadenopathy:    She has no cervical adenopathy.    She has no axillary adenopathy.       Right: No inguinal and no supraclavicular adenopathy present.       Left: No inguinal and no supraclavicular adenopathy present.  Neurological: She is alert and oriented to person, place, and time. Gait normal.  Skin: Skin is warm and dry. No rash noted. No erythema.  Psychiatric: Affect and judgment normal. Her mood appears anxious (tearful in clinic).  Nursing note and vitals reviewed.   Imaging studies: 06/2017:  PET scan revealed enlarging pulmonary nodules but no evidence of abdominal disease per patient report. 09/13/2017:  Abdomen and pelvic CT revealed a small infarct in the inferior pole of the LEFT kidney and RIGHT uterine infarct. 11/08/2017:  PET scan at the [place] [facility] revealed innumerable stable bilateral cavitary pulmonary nodules (up to 8 mm), some of which demonstrate low level metabolic activity.  There were post treatment changes in the pelvis without evidence of suspicious hypermetabolic activity. 12/03/2017:  Abdomen and pelvic CT revealed continued multiple pulmonary nodules in the lung bases concerning for metastatic disease.  There was mild wall thickening of rectosigmoid colon is noted.  There was a stable 1.8 x 1.6 cm  mixed fat and soft tissue density noted in right lower quadrant which may represent fat necrosis.  There was a larger low density is noted involving uterine fundus.  12/03/2017:  Pelvic MRI was unremarkable.   04/15/2018:  Chest, abdomen, and pelvis CT revealed innumerable (> 100) cavitary nodules scattered in the lungs, moderately enlarging compared to the 11/08/2017 PET-CT, suspicious for metastatic disease.  One index node in the RLL measures 1.0 x 1.1 cm (previously 0.6 x 0.6 cm).  There were no new nodules.  There was an ill-defined wall thickening in the rectosigmoid with surrounding stranding along fascia planes, probably sequela from prior radiation therapy.  There was multilevel lumbar impingement due to spondylosis and degenerative disc disease.  There was heterogeneous enhancement in the uterus (some possibly from prior radiation therapy). 04/23/2018:  PET scan revealed numerous scattered solid and cavitary nodules in the lungs stable increased in size compared to the prior PET-CT from 11/08/2017. Largest nodule was 1.1 cm in the LUL (SUV 1.9).  These demonstrated low-grade metabolic activity up to a maximum SUV of about 2.3, increased from 11/08/2017.    Infusion on 08/20/2018  Component Date Value Ref Range Status  . Magnesium 08/20/2018 1.4* 1.7 - 2.4 mg/dL Final   Performed at [facility], [address] [place], [place] [address]  . Sodium 08/20/2018 139  135 - 145 mmol/L Final  . Potassium 08/20/2018 3.8  3.5 - 5.1 mmol/L Final  . Chloride 08/20/2018 106  98 - 111 mmol/L Final  . CO2 08/20/2018 26  22 - 32 mmol/L Final  . Glucose, Bld 08/20/2018 96  70 - 99 mg/dL Final  . BUN 08/20/2018 9  6 - 20 mg/dL Final  . Creatinine, Ser 08/20/2018 0.64  0.44 - 1.00 mg/dL Final  . Calcium 08/20/2018 9.1  8.9 - 10.3 mg/dL Final  . Total Protein 08/20/2018 7.4  6.5 - 8.1 g/dL Final  . Albumin 08/20/2018 3.8  3.5 - 5.0 g/dL Final  . AST 08/20/2018 57* 15 - 41 U/L Final  . ALT  08/20/2018 85* 0 - 44 U/L Final  . Alkaline Phosphatase 08/20/2018 158* 38 - 126 U/L Final  . Total Bilirubin 08/20/2018 0.4  0.3 - 1.2 mg/dL Final  . GFR calc non Af Amer 08/20/2018 >60  >60 mL/min Final  . GFR calc Af Amer 08/20/2018 >60  >60 mL/min Final   Comment: (NOTE) The eGFR has been calculated using the CKD EPI equation. This calculation has not been validated in all clinical situations. eGFR's persistently <60 mL/min signify possible Chronic Kidney Disease.   [place]iann [place] gap 08/20/2018 7  5 - 15 Final   Performed at [facility], [address] [place], [place] [address]  . WBC 08/20/2018 2.0* 4.0 - 10.5 K/uL Final  . RBC 08/20/2018 2.82* 3.87 - 5.11 MIL/uL Final  . Hemoglobin 08/20/2018 9.5* 12.0 - 15.0 g/dL Final  . HCT 08/20/2018 29.2* 36.0 - 46.0 % Final  . MCV 08/20/2018 103.5* 80.0 - 100.0 fL Final  . MCH 08/20/2018 33.7  26.0 - 34.0 pg Final  . MCHC 08/20/2018 32.5  30.0 - 36.0 g/dL Final  . RDW 08/20/2018 16.9* 11.5 - 15.5 % Final  . Platelets 08/20/2018 149* 150 - 400 K/uL Final  . nRBC 08/20/2018 0.0  0.0 - 0.2 % Final  . Neutrophils Relative % 08/20/2018 64  % Final  . Neutro Abs 08/20/2018 1.3* 1.7 - 7.7 K/uL Final  . Lymphocytes Relative 08/20/2018 25  % Final  . Lymphs Abs 08/20/2018 0.5* 0.7 - 4.0 K/uL Final  . Monocytes Relative 08/20/2018 9  % Final  . Monocytes Absolute 08/20/2018 0.2  0.1 -  1.0 K/uL Final  . Eosinophils Relative 08/20/2018 1  % Final  . Eosinophils Absolute 08/20/2018 0.0  0.0 - 0.5 K/uL Final  . Basophils Relative 08/20/2018 0  % Final  . Basophils Absolute 08/20/2018 0.0  0.0 - 0.1 K/uL Final  . Immature Granulocytes 08/20/2018 1  % Final  . Abs Immature Granulocytes 08/20/2018 0.01  0.00 - 0.07 K/uL Final   Performed at [facility], [address] [place], [place] [address]    Assessment:  Joanna Hall is a 52 y.o. female with a history of stage IB1 cervical cancer s/p concurrent cisplatin and radiation  (external beam and brachytherapy) from 11/2016 - 02/2017.    She was treated by Dr. Christene Slates at [facility] in [place], [place].  In 01/2017, she received cisplatin x 2 and carboplatin x 1 (01/29/2017) due to ARF and XRT. Radiation was followed by T & O on 02/01/2017 and T & N on 02/10/2017 and 02/20/2017.  Course was complicated by weight loss (80 pounds), nausea, vomiting, electrolyte wasting (potassium and magnesium), and multiple hospitalizations.  PET scan in 06/2017 revealed enlarging pulmonary nodules but no evidence of abdominal disease per patient report.  She is s/p right ileocolectomy on 08/31/2017 for a small bowel obstruction.  She presented with abdominal pain.  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 PDL-1 testing reveals a CPS score of 15.  Chest, abdomen, and pelvis CT on 04/15/2018 revealed innumerable (> 100) cavitary nodules scattered in the lungs, moderately enlarging compared to the 11/08/2017 PET-CT, suspicious for metastatic disease. One index node in the RLL measures 1.0 x 1.1 cm.  There were no new nodules.  There was an ill-defined wall thickening in the rectosigmoid with surrounding stranding along fascia planes, probably sequela from prior radiation therapy.  There was multilevel lumbar impingement due to spondylosis and degenerative disc disease.  There was heterogeneous enhancement in the uterus (some possibly from prior radiation therapy).  PET scan on 04/23/2018 revealed numerous scattered solid and cavitary nodules in the lungs stable increased in size compared to the prior PET-CT from 11/08/2017. Largest nodule was 1.1 cm in the LUL (SUV 1.9).  These demonstrated low-grade metabolic activity up to a maximum SUV of about 2.3, increased from 11/08/2017.   CT guided biopsy of a left upper lobe  pulmonary nodule on 05/06/2018 confirmed metastatic adenocarcinoma morphologically c/w cervical adenocarcinoma.  She s/p 3 cycles of carboplatin and Taxol (06/04/2018 - 07/23/2018) with Margarette [place] support.    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  Testing was negative for prothrombin gene mutation, lupus anticoagulant panel, anticardiolipin antibodies, and beta-2 glycoprotein antibodies.  She was discharged on Xarelto.  She was admitted to [facility] from 12/03/2017 - 12/08/2017 with abdominal pain. She was not felt to have a uterine infarct.  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  The etiology was unclear.  She is hepatitis C positive.   Hepatitis C genotype is 2a/2c.  She has B12 deficiency.  B12 was 165 on 11/27/2017.  She began B12 injections on 11/29/2017.  B12 injections occur monthly at home.  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  She was diagnosed with colitis.  Abdomen and pelvic CT on 05/23/2018 revealed colonic wall thickening, consistent with colitis. Considerations include inflammatory or infectious causes. Ischemic causes would  be less likely. She is s/p RIGHT hemicolectomy. She had a patent ileocolic anastomosis.  She received a 10 day course of ciprofloxacin and Flagyl.  She has  chronic diarrhea s/p right hemicolectomy.  She is Lomotil, tincture of opium, Colestid, and Viberzi.   Symptomatically,she is fatigued. She is having generalized weakness. Diarrhea persists despite multi-drug management. Nausea is controlled. Her abdomen remains diffusely tender. Eating well; weight up 2 pounds. Exam stable.  WBC 2000 ([place] 1300).  IMA globin 9.5, hematocrit 29.2, MCV 103.5, and platelets 149,000.  AST 57, ALT 85, ALP 158, and total bilirubin 0.4 mg/dL.  Magnesium low at 1.4 mg/dL.  Plan:   1. Labs today: CBC with differential, CMP, magnesium, chromogranin A, gastrin, cortisol, tTG/DGP screen,  24-hour urine for 5-HIAA 2. Cervical cancer  Tolerating treatments fair.  Remains unclear chemotherapy is contributory to her diarrhea.  Nausea controlled.  Labs reviewed.  ANC borderline low.  Continues to have diffuse abdominal pain and significant diarrhea.    Will HOLD cycle #4 carboplatin + paclitaxel with Neulasta support. Patient has antiemetics and pain medications at home to use on a PRN basis, and advises that the prescribed interventions are adequately managing her symptoms at this point. Continue all medications as previously prescribed.  3. Transaminitis  LFTs remain elevated.  AST 57, ALT 85, ALP 158, and total bilirubin 0.4 mg/dL.  Continues to complain of intermittent diffuse abdominal pain.  No alcohol, excessive Tylenol use, or new medications outside of those prescribed by cancer center in GI office.  Followed by gastroenterology Marius Ditch, MD). 4. Nausea, vomiting, and diarrhea  Nausea and vomiting well controlled with currently prescribed interventions  Requires PRN use of several interventions (ondansetron, promethazine, olanzapine) to control.  Patient nauseated today in clinic. Will give ondansetron 8 mg IV and infusion.  Diarrhea persists despite multi-drug management.  Patient currently on Colestid, Lomotil, tincture of opium, and Viberzi.  Questran has been disc continued by gastroenterology.  Recent testing for diarrhea of infectious etiology revealed negative results.  EGD and colonoscopy on 07/30/2018 revealed no source of gastrointestinal bleeding.  Consider VCE for further evaluation given her anemia and melanotic stools. 5. Dehydration and electrolyte derangements  Labs reviewed.  BUN 9 and creatinine 0.64 (CrCl 88.7 mL/min).  Potassium stable at 3.8 mmol/L.  Magnesium low at 1.4 mg/dL  Patient will receive 4 g IV magnesium replacement today and infusion. 6. B12 deficiency  Continues on monthly parenteral B12 supplementation.  MCV  remains elevated at 103.5 fL, which could be related to her B12 deficiency and underlying malignancy.    Injections are given at home by patient sister. 7. 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   Factor V Leiden.  Restart Xarelto. 8. RTC in 1 week for MD assessment, labs (CBC with diff, CMP, Mg), and cycle #4 paclitaxel + carboplatin with Neulasta support   Honor Loh, NP  08/20/2018, 9:55 AM   I saw and evaluated the patient, participating in the key portions of the service and reviewing pertinent diagnostic studies and records.  I reviewed the nurse practitioner's note and agree with the findings and the plan.  The assessment and plan were discussed with the patient.  Multiple questions were asked by the patient and answered.   Nolon Stalls, MD 08/20/2018,9:55 AM

## 2018-08-20 NOTE — Telephone Encounter (Signed)
Patient had an elevated BP at the GI office Please ask how she's feeling If any chest pain, severe headache, shortness of breath, refer her to the ER immediately Appointment with CMA today or tomorrow for recheck BP please or to urgent care

## 2018-08-20 NOTE — Progress Notes (Signed)
Per Honor Loh NP, No treatment at this time, pt to receive 4 grams Magnesium IV only.  1155: Pt returns from [place], reports nausea and Vomiting, "heart racing" and light-headed/dizzy feeling, see flow sheet for VS. Honor Loh NP aware. 1327: Pt states she feels as if she is at her baseline, no other symptoms at this time. Pt stable at discharge.

## 2018-08-20 NOTE — Progress Notes (Signed)
Patent states she continues to have diarrhea.  States she slept all day on Sunday.  No pain today.  Exertional SOB.

## 2018-08-20 NOTE — Telephone Encounter (Signed)
Patient called to inquire about Vitamin D. She said that she was taking weekly Vitamin D until a couple of months ago. Today when she checked out, she noticed that it was on her paperwork, and she was wondering if she needed to be taking it. If so, she will need a refill. Please advise.   dhs

## 2018-08-20 NOTE — Telephone Encounter (Signed)
It will not harm her to take it, but I would have her follow up with Dr. Sanda Klein. She will likely want a level on her before doing something like Drisdol therapy. Daily dose can be obtained as an OTC supplement.   Joanna Hall

## 2018-08-21 ENCOUNTER — Inpatient Hospital Stay: Payer: Medicaid Other

## 2018-08-21 LAB — MISC LABCORP TEST (SEND OUT)
LABCORP TEST CODE: 164040
LABCORP TEST NAME: 164040

## 2018-08-21 NOTE — Telephone Encounter (Signed)
Called patient regarding the Vitamin D. Left voice mail message for patient that it would be ok to take OTC vitamin D per Honor Loh, NP, but that she should check with Dr. Sanda Klein or Dr. Marius Ditch, who originally prescribed it to see if she needs to have her levels checked and get a refill of the prescription vitamin D.    dhs

## 2018-08-21 NOTE — Telephone Encounter (Signed)
Pt states no symptoms, the day her bp was checked she was nervous. She states  was seen yesterday at cancer center and it was normal.

## 2018-08-22 ENCOUNTER — Other Ambulatory Visit
Admission: RE | Admit: 2018-08-22 | Discharge: 2018-08-22 | Disposition: A | Discharge status: Home / Self Care | Payer: Medicaid Other | Source: Ambulatory Visit | Attending: Hematology and Oncology | Admitting: Hematology and Oncology

## 2018-08-22 DIAGNOSIS — C530 Malignant neoplasm of endocervix: Secondary | ICD-10-CM | POA: Insufficient documentation

## 2018-08-22 DIAGNOSIS — C538 Malignant neoplasm of overlapping sites of cervix uteri: Secondary | ICD-10-CM

## 2018-08-22 DIAGNOSIS — Z7901 Long term (current) use of anticoagulants: Secondary | ICD-10-CM

## 2018-08-23 ENCOUNTER — Other Ambulatory Visit: Payer: Self-pay | Admitting: Urgent Care

## 2018-08-23 ENCOUNTER — Encounter: Payer: Self-pay | Admitting: Hematology and Oncology

## 2018-08-23 MED ORDER — PROMETHAZINE HCL 25 MG PO TABS: 25.0000 mg | ORAL_TABLET | Freq: Four times a day (QID) | ORAL | 1 refills | Status: DC | PRN

## 2018-08-23 MED ORDER — ONDANSETRON HCL 8 MG PO TABS: 8.0000 mg | ORAL_TABLET | Freq: Three times a day (TID) | ORAL | 3 refills | Status: DC | PRN

## 2018-08-24 LAB — GASTRIN: Gastrin: 140 pg/mL — ABNORMAL HIGH (ref 0–115)

## 2018-08-26 ENCOUNTER — Telehealth: Payer: Self-pay | Admitting: *Deleted

## 2018-08-26 ENCOUNTER — Inpatient Hospital Stay (HOSPITAL_BASED_OUTPATIENT_CLINIC_OR_DEPARTMENT_OTHER): Payer: Medicaid Other | Admitting: Oncology

## 2018-08-26 ENCOUNTER — Inpatient Hospital Stay: Payer: Medicaid Other

## 2018-08-26 ENCOUNTER — Other Ambulatory Visit: Payer: Self-pay | Admitting: *Deleted

## 2018-08-26 ENCOUNTER — Other Ambulatory Visit: Payer: Self-pay

## 2018-08-26 VITALS — BP 130/85 | HR 86 | Temp 98.1°F | Resp 18

## 2018-08-26 DIAGNOSIS — R110 Nausea: Secondary | ICD-10-CM

## 2018-08-26 DIAGNOSIS — R531 Weakness: Secondary | ICD-10-CM

## 2018-08-26 DIAGNOSIS — A0472 Enterocolitis due to Clostridium difficile, not specified as recurrent: Secondary | ICD-10-CM | POA: Diagnosis not present

## 2018-08-26 DIAGNOSIS — C7800 Secondary malignant neoplasm of unspecified lung: Secondary | ICD-10-CM

## 2018-08-26 DIAGNOSIS — E860 Dehydration: Secondary | ICD-10-CM

## 2018-08-26 DIAGNOSIS — Z5111 Encounter for antineoplastic chemotherapy: Secondary | ICD-10-CM | POA: Diagnosis not present

## 2018-08-26 DIAGNOSIS — R197 Diarrhea, unspecified: Secondary | ICD-10-CM

## 2018-08-26 DIAGNOSIS — C539 Malignant neoplasm of cervix uteri, unspecified: Secondary | ICD-10-CM

## 2018-08-26 DIAGNOSIS — R42 Dizziness and giddiness: Secondary | ICD-10-CM

## 2018-08-26 DIAGNOSIS — C538 Malignant neoplasm of overlapping sites of cervix uteri: Secondary | ICD-10-CM

## 2018-08-26 DIAGNOSIS — Z79899 Other long term (current) drug therapy: Secondary | ICD-10-CM

## 2018-08-26 DIAGNOSIS — D6851 Activated protein C resistance: Secondary | ICD-10-CM | POA: Diagnosis not present

## 2018-08-26 DIAGNOSIS — I959 Hypotension, unspecified: Secondary | ICD-10-CM

## 2018-08-26 DIAGNOSIS — Z95828 Presence of other vascular implants and grafts: Secondary | ICD-10-CM

## 2018-08-26 DIAGNOSIS — Z87891 Personal history of nicotine dependence: Secondary | ICD-10-CM

## 2018-08-26 DIAGNOSIS — D500 Iron deficiency anemia secondary to blood loss (chronic): Secondary | ICD-10-CM

## 2018-08-26 LAB — COMPREHENSIVE METABOLIC PANEL
ALK PHOS: 193 U/L — AB (ref 38–126)
ALT: 63 U/L — ABNORMAL HIGH (ref 0–44)
ANION GAP: 10 (ref 5–15)
AST: 45 U/L — ABNORMAL HIGH (ref 15–41)
Albumin: 4.2 g/dL (ref 3.5–5.0)
BUN: 18 mg/dL (ref 6–20)
CALCIUM: 9.6 mg/dL (ref 8.9–10.3)
CHLORIDE: 100 mmol/L (ref 98–111)
CO2: 25 mmol/L (ref 22–32)
CREATININE: 0.83 mg/dL (ref 0.44–1.00)
Glucose, Bld: 93 mg/dL (ref 70–99)
Potassium: 4.4 mmol/L (ref 3.5–5.1)
SODIUM: 135 mmol/L (ref 135–145)
Total Bilirubin: 0.5 mg/dL (ref 0.3–1.2)
Total Protein: 8.4 g/dL — ABNORMAL HIGH (ref 6.5–8.1)

## 2018-08-26 LAB — CBC WITH DIFFERENTIAL/PLATELET
Abs Immature Granulocytes: 0.02 10*3/uL (ref 0.00–0.07)
Basophils Absolute: 0 10*3/uL (ref 0.0–0.1)
Basophils Relative: 1 %
EOS ABS: 0.1 10*3/uL (ref 0.0–0.5)
EOS PCT: 2 %
HCT: 34.2 % — ABNORMAL LOW (ref 36.0–46.0)
Hemoglobin: 11.1 g/dL — ABNORMAL LOW (ref 12.0–15.0)
IMMATURE GRANULOCYTES: 1 %
Lymphocytes Relative: 31 %
Lymphs Abs: 1.2 10*3/uL (ref 0.7–4.0)
MCH: 33.5 pg (ref 26.0–34.0)
MCHC: 32.5 g/dL (ref 30.0–36.0)
MCV: 103.3 fL — ABNORMAL HIGH (ref 80.0–100.0)
Monocytes Absolute: 0.3 10*3/uL (ref 0.1–1.0)
Monocytes Relative: 8 %
NEUTROS PCT: 57 %
NRBC: 0 % (ref 0.0–0.2)
Neutro Abs: 2.3 10*3/uL (ref 1.7–7.7)
PLATELETS: 189 10*3/uL (ref 150–400)
RBC: 3.31 MIL/uL — AB (ref 3.87–5.11)
RDW: 15.9 % — AB (ref 11.5–15.5)
WBC: 3.9 10*3/uL — AB (ref 4.0–10.5)

## 2018-08-26 LAB — 5 HIAA, QUANTITATIVE, URINE, 24 HOUR
5-HIAA, Ur: 1.8 mg/L
5-HIAA,Quant.,24 Hr Urine: 1.1 mg/24 hr (ref 0.0–14.9)
Total Volume: 600

## 2018-08-26 LAB — MAGNESIUM: MAGNESIUM: 1.6 mg/dL — AB (ref 1.7–2.4)

## 2018-08-26 LAB — SAMPLE TO BLOOD BANK

## 2018-08-26 MED ORDER — SODIUM CHLORIDE 0.9 % IV SOLN
Freq: Once | INTRAVENOUS | Status: AC
  Administered 2018-08-26: 11:00:00 via INTRAVENOUS
  Filled 2018-08-26: qty 250

## 2018-08-26 MED ORDER — HEPARIN SOD (PORK) LOCK FLUSH 100 UNIT/ML IV SOLN
500.0000 [IU] | Freq: Once | INTRAVENOUS | Status: AC
  Administered 2018-08-26: 500 [IU] via INTRAVENOUS

## 2018-08-26 MED ORDER — ONDANSETRON HCL 4 MG/2ML IJ SOLN
8.0000 mg | Freq: Once | INTRAMUSCULAR | Status: AC
  Administered 2018-08-26: 8 mg via INTRAVENOUS
  Filled 2018-08-26: qty 4

## 2018-08-26 MED ORDER — SODIUM CHLORIDE 0.9 % IV SOLN: 2.0000 g | Freq: Once | INTRAVENOUS | Status: DC

## 2018-08-26 MED ORDER — MAGNESIUM SULFATE 2 GM/50ML IV SOLN
2.0000 g | Freq: Once | INTRAVENOUS | Status: AC
  Administered 2018-08-26: 2 g via INTRAVENOUS
  Filled 2018-08-26: qty 50

## 2018-08-26 MED ORDER — DEXAMETHASONE SODIUM PHOSPHATE 10 MG/ML IJ SOLN
10.0000 mg | Freq: Once | INTRAMUSCULAR | Status: AC
  Administered 2018-08-26: 10 mg via INTRAVENOUS
  Filled 2018-08-26: qty 1

## 2018-08-26 MED ORDER — SODIUM CHLORIDE 0.9% FLUSH
10.0000 mL | INTRAVENOUS | Status: DC | PRN
  Administered 2018-08-26: 10 mL via INTRAVENOUS
  Filled 2018-08-26: qty 10

## 2018-08-26 NOTE — Progress Notes (Signed)
Symptom Management Consult note [facility]  [phone] [phone]  Patient Care Team: Arnetha Courser, MD as PCP - General (Family Medicine) Mellody Drown, MD as Consulting Physician (Obstetrics and Gynecology) Lenor Coffin, MD as Attending Physician (Gynecology) Lequita Asal, MD as Medical Oncologist (Hematology and Oncology) [name], MD as Consulting Physician (Gastroenterology) Michael Boston, MD as Consulting Physician (General Surgery) Lovell Sheehan, MD as Consulting Physician (Orthopedic Surgery) Clent Jacks, RN as Registered Nurse   Name of the patient: Joanna Hall  [id number]  [birth year]   Date of visit: 08/26/2018  Diagnosis: Cervical Cancer   Chief Complaint: Dehydration  Current Treatment: S/p 3 cycles carb/taxol. Cycle 4 held d/t neutropenia.   Oncology History: Patient last seen by primary oncologist Dr. Mike Gip on 08/20/2018.  At that visit, she was feeling okay despite continued diarrhea and intermittent nausea.  She continued to complain of diffuse abdominal pain.  Appetite stable.  Continued to require electrolyte replacement weekly.  Last received magnesium 4 g on 08/20/2018, 8 mg Zofran and IV fluids.  Maintains a chronically low magnesium level.  Most recent was 1.4.  Historically, she has been seen in [facility] for electrolyte replacement and IV fluids. Evaluated on 08/15/18-11/119 for ongoing diarrhea that was "black and mushy".  Stool was guaiac positive.  She was given IV fluids electrolytes and started on Viberzi.  Her anticoagulation was held.  GI was consulted.   She was evaluated by Dr. Marius Ditch (gastroenterologist) on 08/19/2018 d/t worsening diarrhea after beginning amoxicillin for a dental infection.  She had a colonoscopy and EGD recently which was unremarkable including biopsies.  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  Diarrhea multifactorial  given resection and chemotherapy.  She was given a trial of probiotics, colestipol was increased to 1 tablet with each meal, Dycyclomine for abdominal cramping, increase Lomotil to TID, continue budesonide 3 mg TID and start amitriptyline 50 mg at bedtime.  Additionally, she wanted to check VIP, cortisol, gastrin, 5 HIAA, chromogranin A levels and DGP.  Scheduled to return to GI in approximately 1 month for results and follow-up.   Has chronic hep C; will began [place] for 8 weeks.  Will begin after chemotherapy.   Oncology History   Patient was diagnosed with cervical adenocarcinoma in [place] in 09/2016.  She has had a long-standing history of abnormal Pap smears.  She presented with an ovarian cyst.  Laparoscopic surgery was pursued but was difficult due to scar tissue.  Had BSO and rupture of cyst with purulent drainage into the abdomen.  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  Radical hysterectomy was aborted.  She was treated by Dr. Christene Slates at [facility] in [place], [place].  Decision was made to pursue concurrent chemotherapy (weekly cisplatin) and radiation.  She received treatment from 11/2016-05/2017.  01/2017 cisplatin x 2 and carboplatin x 1 (01/29/2017) due to ARF and XRT.  XRT was followed by T & O on 02/01/2017 and T & N 02/10/2017 and 02/20/2017.  Course was complicated by 80 pound weight loss, nausea, vomiting, electrolyte wasting (potassium and magnesium).  She describes that.  Is been sick constantly requiring at least 20 hospitalizations.  Follow-up CT chest and PET on 06/2017. Per patient, 'radiation worked' and no disease in the abdomen.  At that time she was noted to have lung nodules that were growing and follow-up imaging was scheduled for 10/2017.  She was  admitted to hospital in [place] for small bowel obstruction which was managed conservatively and she was home for a week prior to traveling to [place] for Thanksgiving  holiday where she has family.  She presented to ER in [place] on 08/2017 with nausea, vomiting, and lower abdominal pain.  Symptoms did not respond to conservative treatment.  CT on 08/26/2017 revealed small bowel obstruction with transition in the pelvis just superior to the uterus rather was a long segment of distal ileum with fatty wall thickening compatible with chronic inflammation and/or radiation enteritis. Imaging showed numerous pulmonary nodules consistent with metastatic disease. She underwent laparotomy and right ileocolectomy on 08/31/2017 at [facility].  Surgical findings revealed a thickened, matted, and scarred piece of distal small bowel close to the ileocecal valve.  She was discharged on 09/05/2017.  Pain markedly increased in intensity and imaging was performed on 09/11/2017 which revealed: Debris within the anterior abdominal wall incision concerning for infection versus packing material, s/p post ileo-colectomy with expected postoperative changes, mild colonic ileus, numerous pulmonary nodules highly concerning for metastatic disease, punctate nonobstructing nephrolithiasis.  Staples were removed and one was packed.  She was started on doxycycline.  Abdominal and pelvic CT without contrast on 09/11/2017 revealed debris within anterior abdominal wall incision concerning for infection, versus packing material.She is s/pileocolectomy with expected postoperative changes and mild colonic ileus. There were numerous pulmonary nodules highly concerning for metastatic diseaseand punctate nonobstructing nephrolithiasis. She was readmitted on 09/12/2017. She describes the onset of lower abdominal pain on 09/09/2017. Pain markedly increased in intensity on 09/11/2017.  Staples were removed and the wound packed. She was started on doxycycline.  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  PET scan was ordered to evaluate enlarging lung nodules with concern for recurrent cervical cancer but scan was delayed due to insurance and need to be performed in [place].  Presented to ER on 12/03/2017 for abdominal pain and emesis.  Imaging concerning for worsening possible uterine infarct and she was admitted to hospital.  Pelvic MRI was unremarkable.  Remote scarring type changes of uterus thought to be possibly related to radiation.  She was discharged on 12/08/2017.  Underwent endoscopy and colonoscopy on 12/20/2017.    On 02/22/2018 she underwent laser ablation of condylomata around the anus and vagina under anesthesia with Dr. Johney Maine.   04/15/2018: Chest, abdomen, and pelvis CTrevealed innumerable (>100) cavitary nodules scattered in the lungs, moderately enlarging compared to the 11/08/2017 PET-CT, suspicious for metastatic disease. One index node in the RLL measures 1.0 x 1.1 cm (previously 0.6 x 0.6 cm). There were no new nodules. There was an ill-defined wall thickening in the rectosigmoid with surrounding stranding along fascia planes, probably sequela from prior radiation therapy. There was multilevel lumbar impingement due to spondylosis and degenerative disc disease. There was heterogeneous enhancement in the uterus (some possibly from prior radiation therapy).  04/23/2018:PET scan revealednumerous scattered solid and cavitary nodules in the lungs stable increased in size compared to the prior PET-CT from 11/08/2017.Largest nodule was 1.1 cm in the LUL (SUV 1.9).These demonstratedlow-grade metabolic activity up to a maximum SUV of about 2.3, increased from 11/08/2017.  Case was discussed  at tumor board on 04/25/2018. Consensus to pursue CT-guided biopsy (05/06/18) which revealed: Metastatic adenocarcinoma, morphologically consistent with cervical adenocarcinoma.  She has history of chronic hepatitis C which is managed by GI.  Hepatitis C genotype is 2a/2c.  She receives B12 injections for history of B12 deficiency.  On 04/24/2018 she underwent left total knee replacement with Dr. [name].     Malignant neoplasm of overlapping sites of cervix ([place])   09/18/2017 Initial Diagnosis    Malignant neoplasm of overlapping sites of cervix ([place])    05/15/2018 -  Chemotherapy    The patient had palonosetron (ALOXI) injection 0.25 mg, 0.25 mg, Intravenous,  Once, 3 of 6 cycles Administration: 0.25 mg (06/04/2018), 0.25 mg (06/25/2018), 0.25 mg (07/23/2018) pegfilgrastim (NEULASTA) injection 6 mg, 6 mg, Subcutaneous, Once, 3 of 6 cycles Administration: 6 mg (06/06/2018), 6 mg (06/26/2018), 6 mg (07/24/2018) CARBOplatin (PARAPLATIN) 500 mg in sodium chloride 0.9 % 250 mL chemo infusion, 500 mg (100 % of original dose 497.2 mg), Intravenous,  Once, 3 of 6 cycles Dose modification:   (original dose 497.2 mg, Cycle 1, Reason: Provider Judgment, Comment: difficulty with counts with initial chemo in [place]; advance dose as tolerated),   (original dose 497.2 mg, Cycle 5, Reason: Provider Judgment, Comment: return back to original dose) Administration: 500 mg (06/04/2018), 560 mg (06/25/2018), 500 mg (07/23/2018) PACLitaxel (TAXOL) 264 mg in sodium chloride 0.9 % 250 mL chemo infusion (> 74m/m2), 140 mg/m2 = 264 mg (100 % of original dose 140 mg/m2), Intravenous,  Once, 3 of 6 cycles Dose modification: 140 mg/m2 (original dose 140 mg/m2, Cycle 1, Reason: Provider Judgment, Comment: difficulty with counts [place] chemo [place] advance dose as tolerated), 155 mg/m2 (original dose 140 mg/m2, Cycle 2, Reason: Provider Judgment, Comment: advance as tolerated), 155 mg/m2 (original dose 175 mg/m2,  Cycle 5, Reason: Provider Judgment, Comment: continue current dose) Administration: 264 mg (06/04/2018), 294 mg (06/25/2018), 294 mg (07/23/2018)  for chemotherapy treatment.      Subjective Data:  Subjective:     KLODA BIALASis a 52y.o. female who presents for evaluation of dizziness and weakness. The symptoms started 3 days ago and are worse. The episodes occur intermittently and last a few minutes. Positions that worsen symptoms: any motion and standing up. Previous workup/treatments: none and IV fluids for orthostasis. Associated ear symptoms: none. Associated CNS symptoms: none. Recent infections: Dental infection currently on amoxicillin. Head trauma: denied.   This is a long-standing problem given her history of chronic diarrhea.  Unfortunately, patient knows when electrolytes are low and when she is dehydrated from recurring symptoms (dizziness and weakness).  She last received IV magnesium on 08/20/18.   The following portions of the patient's history were reviewed and updated as appropriate: allergies, current medications, past family history, past medical history, past social history, past surgical history and problem list.  Review of Systems A comprehensive review of systems was negative except for: Constitutional: positive for fatigue and malaise Musculoskeletal: positive for muscle weakness and myalgias Neurological: positive for dizziness and weakness    Objective:    BP 130/85 (BP Location: Right Arm, Patient Position: Sitting) Comment: standing bp 101/65  Pulse 86 Comment: standing pulse 95  Temp 98.1 F (36.7 C) (Oral)   Resp 18  General appearance: alert, fatigued and no distress Lungs: clear to auscultation bilaterally Heart: regular rate and rhythm, S1, S2 normal, no murmur, click, rub or gallop and othostasis with position changes Abdomen:  soft, non-tender; bowel sounds normal; no masses,  no organomegaly Skin: Skin color, texture, turgor normal. No rashes or  lesions Neurologic: Grossly normal      Assessment:    Dizziness/weakness d/t dehydration secondary to chronic diarrhea    Plan:    Steroids per medication orders. Antiemetic per medication orders. Educational materials given and questions answered. IV fluids per orders.     1.  Recurrent/metastatic cervical cancer: Diagnosed 12/17 in [place]. S/p cisplatin x2 and carbo x1 discontinued d/t ARF.  Had external beam radiation followed by vaginal brachytherapy in 2018.  PET scan from 06/2017 revealed enlarging pulmonary nodules.  Developed small bowel obstruction in November 2018 and had a right ileocolectomy.  Biopsy of left upper lobe pulmonary nodule confirmed metastatic adenocarcinoma consistent with cervical origin.  Started carbo/Taxol on 06/04/2018.  She is currently status post cycle 3 last given on 07/23/2018.  Cycle 4 has been postponed given significant neutropenia.  She is scheduled to return to clinic tomorrow for cycle 4 carbo/Taxol with [facility] support.   2.  Weakness/dizziness/dehydration: Chronic secondary to diarrhea.  Orthostatic today.  Sitting bp 130/85, standing bp 101/65.  Notes improvement with most recent medication changesof her diarrhea.  States yesterday she only had one soft bowel movement and one this morning.  Abdominal pain improving.  3.  Intermittent nausea: Continue antiemetics as prescribed.  Give IV Decadron and IV Zofran in clinic today.  Plan: Stat labs.  Hypomagnesemia (1.6), neutropenia (3.9, ANC normal), anemia (11.1 previously 9.5).  Electrolyte replacement with IV fluids. (2 g magnesium and 1 L NaCl) 10 mg IV Decadron 8 mg IV Zofran.  Orthostasis resolved with IV fluids.  She is instructed to continue medications as prescribed by GI in hopes to further reduce diarrhea frequency.  Scheduled to begin cycle 4 carbo/Taxol tomorrow.   Greater than 50% was spent in counseling and coordination of care with this patient including but not limited to  discussion of the relevant topics above (See A&P) including, but not limited to diagnosis and management of acute and chronic medical conditions.   Faythe Casa, NP 08/27/2018 12:08 PM

## 2018-08-26 NOTE — Telephone Encounter (Signed)
Patient called stating she has been in bed for 2 days due to weakness and she thinks her Mg+ is low She is asking to come in for IV fluids, Mag, and possibly blood transfusion.Appt given for this AM

## 2018-08-27 ENCOUNTER — Encounter: Payer: Self-pay | Admitting: Hematology and Oncology

## 2018-08-27 ENCOUNTER — Inpatient Hospital Stay (HOSPITAL_BASED_OUTPATIENT_CLINIC_OR_DEPARTMENT_OTHER): Payer: Medicaid Other | Admitting: Hematology and Oncology

## 2018-08-27 ENCOUNTER — Other Ambulatory Visit: Payer: Self-pay | Admitting: *Deleted

## 2018-08-27 ENCOUNTER — Inpatient Hospital Stay: Payer: Medicaid Other

## 2018-08-27 VITALS — BP 142/81 | HR 91 | Temp 98.0°F | Resp 18 | Wt 167.4 lb

## 2018-08-27 DIAGNOSIS — C538 Malignant neoplasm of overlapping sites of cervix uteri: Secondary | ICD-10-CM

## 2018-08-27 DIAGNOSIS — C539 Malignant neoplasm of cervix uteri, unspecified: Secondary | ICD-10-CM

## 2018-08-27 DIAGNOSIS — Z5111 Encounter for antineoplastic chemotherapy: Secondary | ICD-10-CM | POA: Diagnosis not present

## 2018-08-27 DIAGNOSIS — Z87891 Personal history of nicotine dependence: Secondary | ICD-10-CM

## 2018-08-27 DIAGNOSIS — D500 Iron deficiency anemia secondary to blood loss (chronic): Secondary | ICD-10-CM | POA: Diagnosis not present

## 2018-08-27 DIAGNOSIS — E860 Dehydration: Secondary | ICD-10-CM

## 2018-08-27 DIAGNOSIS — K529 Noninfective gastroenteritis and colitis, unspecified: Secondary | ICD-10-CM

## 2018-08-27 DIAGNOSIS — Z7189 Other specified counseling: Secondary | ICD-10-CM

## 2018-08-27 DIAGNOSIS — C7800 Secondary malignant neoplasm of unspecified lung: Secondary | ICD-10-CM | POA: Diagnosis not present

## 2018-08-27 DIAGNOSIS — Z79899 Other long term (current) drug therapy: Secondary | ICD-10-CM

## 2018-08-27 DIAGNOSIS — R197 Diarrhea, unspecified: Secondary | ICD-10-CM

## 2018-08-27 DIAGNOSIS — D6851 Activated protein C resistance: Secondary | ICD-10-CM

## 2018-08-27 DIAGNOSIS — E2740 Unspecified adrenocortical insufficiency: Secondary | ICD-10-CM

## 2018-08-27 LAB — CBC WITH DIFFERENTIAL/PLATELET
Abs Immature Granulocytes: 0.02 10*3/uL (ref 0.00–0.07)
Basophils Absolute: 0 10*3/uL (ref 0.0–0.1)
Basophils Relative: 0 %
Eosinophils Absolute: 0 10*3/uL (ref 0.0–0.5)
Eosinophils Relative: 0 %
HCT: 31.3 % — ABNORMAL LOW (ref 36.0–46.0)
Hemoglobin: 10.2 g/dL — ABNORMAL LOW (ref 12.0–15.0)
Immature Granulocytes: 1 %
Lymphocytes Relative: 26 %
Lymphs Abs: 1.1 10*3/uL (ref 0.7–4.0)
MCH: 33.8 pg (ref 26.0–34.0)
MCHC: 32.6 g/dL (ref 30.0–36.0)
MCV: 103.6 fL — ABNORMAL HIGH (ref 80.0–100.0)
Monocytes Absolute: 0.3 10*3/uL (ref 0.1–1.0)
Monocytes Relative: 6 %
Neutro Abs: 2.8 10*3/uL (ref 1.7–7.7)
Neutrophils Relative %: 67 %
Platelets: 175 10*3/uL (ref 150–400)
RBC: 3.02 MIL/uL — ABNORMAL LOW (ref 3.87–5.11)
RDW: 15.5 % (ref 11.5–15.5)
WBC: 4.2 10*3/uL (ref 4.0–10.5)
nRBC: 0 % (ref 0.0–0.2)

## 2018-08-27 LAB — COMPREHENSIVE METABOLIC PANEL
ALT: 52 U/L — ABNORMAL HIGH (ref 0–44)
AST: 32 U/L (ref 15–41)
Albumin: 3.9 g/dL (ref 3.5–5.0)
Alkaline Phosphatase: 185 U/L — ABNORMAL HIGH (ref 38–126)
Anion gap: 10 (ref 5–15)
BUN: 16 mg/dL (ref 6–20)
CO2: 24 mmol/L (ref 22–32)
Calcium: 9.2 mg/dL (ref 8.9–10.3)
Chloride: 103 mmol/L (ref 98–111)
Creatinine, Ser: 0.74 mg/dL (ref 0.44–1.00)
GFR calc Af Amer: >60 mL/min (ref >60)
GFR calc non Af Amer: >60 mL/min (ref >60)
Glucose, Bld: 106 mg/dL — ABNORMAL HIGH (ref 70–99)
Potassium: 3.9 mmol/L (ref 3.5–5.1)
Sodium: 137 mmol/L (ref 135–145)
Total Bilirubin: 0.2 mg/dL — ABNORMAL LOW (ref 0.3–1.2)
Total Protein: 7.9 g/dL (ref 6.5–8.1)

## 2018-08-27 LAB — MAGNESIUM: Magnesium: 1.7 mg/dL (ref 1.7–2.4)

## 2018-08-27 MED ORDER — SODIUM CHLORIDE 0.9% FLUSH
10.0000 mL | INTRAVENOUS | Status: DC | PRN
  Administered 2018-08-27: 10 mL via INTRAVENOUS
  Filled 2018-08-27: qty 10

## 2018-08-27 MED ORDER — HEPARIN SOD (PORK) LOCK FLUSH 100 UNIT/ML IV SOLN
500.0000 [IU] | Freq: Once | INTRAVENOUS | Status: AC
  Administered 2018-08-27: 500 [IU] via INTRAVENOUS
  Filled 2018-08-27: qty 5

## 2018-08-27 MED ORDER — RIVAROXABAN 20 MG PO TABS: 20.0000 mg | ORAL_TABLET | Freq: Every day | ORAL | 3 refills | Status: DC

## 2018-08-27 NOTE — Progress Notes (Signed)
[facility] day:  08/27/2018   Chief Complaint: Joanna Hall is a 52 y.o. female with stage IV cervical cancer who is seen for 1 week assessment prior to cycle #4 carboplatin and Taxol.  HPI:  The patient was last seen in the medical oncology clinic on 08/20/2018.  At that time, she was still struggling with her bowels.  She was having ongoing issues with diarrhea and fecal incontinence.  Labs revealed a hematocrit of 29.2, hemoglobin 9.5, MCV 103.5, platelets 149,000, WBC 2000 with an Hemphill of 1300.  Magnesium was 1.4.  She received 4 gm IV magnesium, 8 mg ondansetron, and IVF.  Chemotherapy was held.  She is undergoing testing by GI.  Gastrin was 140 and cortisol 2.7 on 08/20/2018.  Urine for 5-HIAA was 1.8 mg/L and 1.1 mg/24 hours (0-14.9).  Chromagranin is pending.  Vasoactive intestinal polypeptide (VIP) has been ordered.  She was seen by Faythe Casa, NP on 08/26/2018 for a sick call visit.  CBC revealed a hematocrit of 34.2, hemoglobin 11.1, platelets 189,000, WBC 3900 with an ANC of 2300.  Magnesium was 1.6.  She received 1 liter IVF, 2 gm magnesium and 8 mg of ondansetron.  Symptomatically, patient is feeling well today. She notes that she was dizzy over the weekend, thus necessitating her visit to the [facility] yesterday.  Since receiving IV fluids and magnesium replacement, patient notes that she is feeling much better.  No diarrhea in the last 2 to 3 days.  Patient notes that her stools are now formed.  She continues on multiple interventions for her bowels.  Patient is taking Colestid, Lomotil, tincture of opium, amitriptyline, and Viberzi.  She notes that this regimen is complex and she wishes to reduce the amount of medications that she is taking.  Energy is stable.  Patient denies any bleeding.  She complains of orthostatic changes in clinic yesterday.  There was (+) documented orthostasis on 08/26/2018.   Patient denies that she has  experienced any B symptoms. She denies any interval infections. Patient advises that she maintains an adequate appetite. She is eating well. Weight today is 167 lb 6 oz (75.9 kg), which compared to her last visit to the clinic, represents a stable weight.   Patient complains of pain rated 4/10 in clinic today.   Past Medical History:  Diagnosis Date  . Abnormal cervical Papanicolaou smear 09/18/2017  . Anxiety   . Aortic atherosclerosis ([place])   . Arthritis    neck and knees  . 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 Blood transfusion without reported diagnosis   . Cervical cancer (HCC)    mets lung  . Chronic anal fissure   . Chronic diarrhea   . Dyspnea   . Factor V Leiden mutation ([place])   . Fecal incontinence   . Genital warts   . GERD (gastroesophageal reflux disease)   . Heart murmur   . Hemorrhoids   . 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 History of cancer chemotherapy    completed 06/2017  . History of Clostridium difficile infection    while undergoing chemo.  Negative test 10/2017  . 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 Intestinal infection due to Clostridium difficile 09/18/2017  . Macrocytic anemia with vitamin B12 deficiency   . Perianal condylomata   . Pneumonia  History of  . Pulmonary nodules   . Rectal bleeding   . Small bowel obstruction ([place]) 08/2017  . Stiff neck    limited right turn  . Vitamin D deficiency     Past Surgical History:  Procedure Laterality Date  . CHOLECYSTECTOMY    . COLON SURGERY  08/2017   resection  . COLONOSCOPY WITH PROPOFOL N/A 12/20/2017   Procedure: COLONOSCOPY WITH PROPOFOL;  Surgeon: [name], MD;  Location: [facility] ENDOSCOPY;  Service: Gastroenterology;  Laterality: N/A;  . COLONOSCOPY WITH PROPOFOL N/A 07/30/2018   Procedure: COLONOSCOPY WITH PROPOFOL;  Surgeon: [name], MD;  Location: [facility] ENDOSCOPY;  Service: Gastroenterology;   Laterality: N/A;  . DIAGNOSTIC LAPAROSCOPY    . ESOPHAGOGASTRODUODENOSCOPY (EGD) WITH PROPOFOL N/A 12/20/2017   Procedure: ESOPHAGOGASTRODUODENOSCOPY (EGD) WITH PROPOFOL;  Surgeon: [name], MD;  Location: [facility];  Service: Gastroenterology;  Laterality: N/A;  . ESOPHAGOGASTRODUODENOSCOPY (EGD) WITH PROPOFOL  07/30/2018   Procedure: ESOPHAGOGASTRODUODENOSCOPY (EGD) WITH PROPOFOL;  Surgeon: [name], MD;  Location: [facility] ENDOSCOPY;  Service: Gastroenterology;;  . LAPAROTOMY N/A 08/31/2017   Procedure: EXPLORATORY LAPAROTOMY for SBO, ileocolectomy, removal of piece of uterine wall;  Surgeon: [name], MD;  Location: [facility] ORS;  Service: General;  Laterality: N/A;  . LASER ABLATION CONDOLAMATA N/A 02/22/2018   Procedure: LASER ABLATION/REMOVAL OF [id number] AROUND ANUS AND VAGINA;  Surgeon: Michael Boston, MD;  Location: [facility];  Service: General;  Laterality: N/A;  . OOPHORECTOMY    . PORTA CATH INSERTION N/A 05/13/2018   Procedure: PORTA CATH INSERTION;  Surgeon: Katha Cabal, MD;  Location: [place] CV LAB;  Service: Cardiovascular;  Laterality: N/A;  . SMALL INTESTINE SURGERY    . TANDEM RING INSERTION     x3  . THORACOTOMY    . TOTAL KNEE ARTHROPLASTY Left 04/24/2018   Procedure: TOTAL KNEE ARTHROPLASTY;  Surgeon: Lovell Sheehan, MD;  Location: [facility] ORS;  Service: Orthopedics;  Laterality: Left;    Family History  Problem Relation Age of Onset  . Hypertension Father   . Diabetes Father   . Alcohol abuse Daughter   . Hypertension Maternal Grandmother   . Diabetes Maternal Grandmother   . Diabetes Paternal Grandmother   . Hypertension Paternal Grandmother     Social History:  reports that she quit smoking about 11 years ago. She has never used smokeless tobacco. She reports that she drinks alcohol. She reports that she has current or past drug history. Drug: Marijuana.  The patient's 73 year old daughter died of alcoholism.   The patient is originally from [place].  She moved from [place] to [place]. She is alone today.   Allergies:  Allergies  Allergen Reactions  . Ketamine Anxiety and Other (See Comments)    Syncope episode/confusion     Current Medications: Current Outpatient Medications  Medication Sig Dispense Refill  . amitriptyline (ELAVIL) 50 MG tablet Take 1 tablet (50 mg total) by mouth at bedtime. 30 tablet 2  . budesonide (ENTOCORT EC) 3 MG 24 hr capsule Take 3 capsules (9 mg total) by mouth daily. 90 capsule 2  . Calcium Carb-Cholecalciferol (CALCIUM 500 +D) 500-400 MG-UNIT TABS Take 2 tablets by mouth daily.    . colestipol (COLESTID) 1 g tablet Take 2 tablets (2 g total) by mouth 3 (three) times daily with meals. 180 tablet 0  . cyanocobalamin (,VITAMIN B-12,) 1000 MCG/ML injection Inject 1ML every week x 4 weeks, then 1ML every other week  x 2 months, then monthly x 3 months (Patient taking differently: Inject 1,000 mcg into the muscle every 30 (thirty) days. Inject 1ML every week x 4 weeks, then 1ML every other week x 2 months, then monthly x 3 months) 10 mL 1  . dicyclomine (BENTYL) 20 MG tablet Take 1 tablet (20 mg total) by mouth every 6 (six) hours. (Patient taking differently: Take 20 mg by mouth every 6 (six) hours. Taking as needed) 20 tablet 0  . Eluxadoline (VIBERZI) 100 MG TABS Take 1 tablet (100 mg total) by mouth 2 (two) times daily with a meal. 60 tablet 0  . Multiple Vitamins-Minerals (MULTIVITAMIN WITH MINERALS) tablet Take 1 tablet by mouth daily. 30 tablet 0  . OLANZapine zydis (ZYPREXA) 5 MG disintegrating tablet Take 1 tablet (5 mg total) by mouth at bedtime. 30 tablet 0  . ondansetron (ZOFRAN) 8 MG tablet Take 1 tablet (8 mg total) by mouth every 8 (eight) hours as needed for nausea or vomiting. 30 tablet 3  . Opium 10 MG/ML (1%) TINC Take 1 mL (10 mg total) by mouth 3 (three) times daily with meals. 473 mL 0  . pantoprazole (PROTONIX) 40 MG tablet Take 1  tablet (40 mg total) by mouth daily. 30 tablet 0  . promethazine (PHENERGAN) 25 MG tablet Take 1 tablet (25 mg total) by mouth every 6 (six) hours as needed for nausea or vomiting. 30 tablet 1  . rivaroxaban (XARELTO) 20 MG TABS tablet Take 1 tablet (20 mg total) by mouth daily with supper. 30 tablet 1  . Syringe/Needle, Disp, (SYRINGE 3CC/22GX1") 22G X 1" 3 ML MISC For use with Vitamin B12 injections 50 each 0  . traZODone (DESYREL) 50 MG tablet Take 0.5-1 tablets (25-50 mg total) by mouth at bedtime as needed for sleep. 30 tablet 0  . VENTOLIN HFA 108 (90 Base) MCG/ACT inhaler Inhale 1-2 puffs into the lungs every 4 (four) hours as needed for shortness of breath.   0   No current facility-administered medications for this visit.    Facility-Administered Medications Ordered in Other Visits  Medication Dose Route Frequency Provider Last Rate Last Dose  . heparin lock flush 100 unit/mL  500 Units Intravenous Once Lequita Asal, MD         Review of Systems  Constitutional: Negative for chills, diaphoresis, fever, malaise/fatigue and weight loss (stable).  HENT: Negative.  Negative for congestion, ear discharge, ear pain, nosebleeds, sinus pain and sore throat.   Eyes: Negative.  Negative for double vision, photophobia, pain, discharge and redness.  Respiratory: Positive for shortness of breath (exertional). Negative for cough, hemoptysis, sputum production and wheezing.   Cardiovascular: Negative.  Negative for chest pain, palpitations, orthopnea, leg swelling and PND.  Gastrointestinal: Positive for abdominal pain (diffuse), heartburn (on PPI) and nausea (controlled). Negative for blood in stool, constipation, diarrhea (improved with multi-drug management), melena and vomiting.  Genitourinary: Negative for dysuria, frequency, hematuria and urgency.  Musculoskeletal: Positive for joint pain (OA in shoulders) and neck pain (OA). Negative for back pain, falls and myalgias.  Skin: Negative  for itching and rash.  Neurological: Positive for dizziness (improved after IVFs). Negative for tremors, weakness and headaches.  Endo/Heme/Allergies: Does not bruise/bleed easily.  Psychiatric/Behavioral: Negative for depression, memory loss and suicidal ideas. The patient is not nervous/anxious and does not have insomnia.   All other systems reviewed and are negative.  Performance status (ECOG): 1-2  Vital Signs BP (!) 142/81 (BP Location: Left Arm, Patient Position: Sitting)  Pulse 91   Temp 98 F (36.7 C) (Tympanic)   Resp 18   Wt 167 lb 6 oz (75.9 kg)   BMI 26.21 kg/m   Physical Exam  Constitutional: She is oriented to person, place, and time and well-developed, well-nourished, and in no distress.  HENT:  Head: Normocephalic and atraumatic.  Mouth/Throat: Oropharynx is clear and moist and mucous membranes are normal. No oropharyngeal exudate.  Wearing a pink cap.  Eyes: Pupils are equal, round, and reactive to light. Conjunctivae and EOM are normal. No scleral icterus.  Blue eyes.  Neck: Normal range of motion. Neck supple. No JVD present.  Cardiovascular: Normal rate, regular rhythm, normal heart sounds and intact distal pulses. Exam reveals no gallop and no friction rub.  No murmur heard. Pulmonary/Chest: Effort normal and breath sounds normal. No respiratory distress. She has no wheezes. She has no rales.  Abdominal: Soft. Bowel sounds are normal. She exhibits no distension. There is generalized abdominal tenderness (minimal).  Musculoskeletal: Normal range of motion.        General: No tenderness or edema.  Lymphadenopathy:    She has no cervical adenopathy.    She has no axillary adenopathy.       Right: No inguinal and no supraclavicular adenopathy present.       Left: No inguinal and no supraclavicular adenopathy present.  Neurological: She is alert and oriented to person, place, and time. Gait normal.  Skin: Skin is warm and dry. No rash noted. No erythema.   Psychiatric: Mood, affect and judgment normal.  Nursing note and vitals reviewed.   Imaging studies: 06/2017:  PET scan revealed enlarging pulmonary nodules but no evidence of abdominal disease per patient report. 09/13/2017:  Abdomen and pelvic CT revealed a small infarct in the inferior pole of the LEFT kidney and RIGHT uterine infarct. 11/08/2017:  [facility] scan at the [facility] revealed innumerable stable bilateral cavitary pulmonary nodules (up to 8 mm), some of which demonstrate low level metabolic activity.  There were post treatment changes in the pelvis without evidence of suspicious hypermetabolic activity. 12/03/2017:  Abdomen and pelvic CT revealed continued multiple pulmonary nodules in the lung bases concerning for metastatic disease.  There was mild wall thickening of rectosigmoid colon is noted.  There was a stable 1.8 x 1.6 cm mixed fat and soft tissue density noted in right lower quadrant which may represent fat necrosis.  There was a larger low density is noted involving uterine fundus.  12/03/2017:  Pelvic MRI was unremarkable.   04/15/2018:  Chest, abdomen, and pelvis CT revealed innumerable (> 100) cavitary nodules scattered in the lungs, moderately enlarging compared to the 11/08/2017 PET-CT, suspicious for metastatic disease.  One index node in the RLL measures 1.0 x 1.1 cm (previously 0.6 x 0.6 cm).  There were no new nodules.  There was an ill-defined wall thickening in the rectosigmoid with surrounding stranding along fascia planes, probably sequela from prior radiation therapy.  There was multilevel lumbar impingement due to spondylosis and degenerative disc disease.  There was heterogeneous enhancement in the uterus (some possibly from prior radiation therapy). 04/23/2018:  PET scan revealed numerous scattered solid and cavitary nodules in the lungs stable increased in size compared to the prior PET-CT from 11/08/2017. Largest nodule was 1.1 cm in the  LUL (SUV 1.9).  These demonstrated low-grade metabolic activity up to a maximum SUV of about 2.3, increased from 11/08/2017.    Appointment on 08/27/2018  Component Date Value Ref  Range Status  . Sodium 08/27/2018 137  135 - 145 mmol/L Final  . Potassium 08/27/2018 3.9  3.5 - 5.1 mmol/L Final  . Chloride 08/27/2018 103  98 - 111 mmol/L Final  . CO2 08/27/2018 24  22 - 32 mmol/L Final  . Glucose, Bld 08/27/2018 106* 70 - 99 mg/dL Final  . BUN 08/27/2018 16  6 - 20 mg/dL Final  . Creatinine, Ser 08/27/2018 0.74  0.44 - 1.00 mg/dL Final  . Calcium 08/27/2018 9.2  8.9 - 10.3 mg/dL Final  . Total Protein 08/27/2018 7.9  6.5 - 8.1 g/dL Final  . Albumin 08/27/2018 3.9  3.5 - 5.0 g/dL Final  . AST 08/27/2018 32  15 - 41 U/L Final  . ALT 08/27/2018 52* 0 - 44 U/L Final  . Alkaline Phosphatase 08/27/2018 185* 38 - 126 U/L Final  . Total Bilirubin 08/27/2018 0.2* 0.3 - 1.2 mg/dL Final  . GFR calc non Af Amer 08/27/2018 >60  >60 mL/min Final  . GFR calc Af Amer 08/27/2018 >60  >60 mL/min Final   Comment: (NOTE) The eGFR has been calculated using the CKD EPI equation. This calculation has not been validated in all clinical situations. eGFR's persistently <60 mL/min signify possible Chronic Kidney Disease.   Georgiann Hahn gap 08/27/2018 10  5 - 15 Final   Performed at [facility], [address] [place], [place] [address]  . WBC 08/27/2018 4.2  4.0 - 10.5 K/uL Final  . RBC 08/27/2018 3.02* 3.87 - 5.11 MIL/uL Final  . Hemoglobin 08/27/2018 10.2* 12.0 - 15.0 g/dL Final  . HCT 08/27/2018 31.3* 36.0 - 46.0 % Final  . MCV 08/27/2018 103.6* 80.0 - 100.0 fL Final  . MCH 08/27/2018 33.8  26.0 - 34.0 pg Final  . MCHC 08/27/2018 32.6  30.0 - 36.0 g/dL Final  . RDW 08/27/2018 15.5  11.5 - 15.5 % Final  . Platelets 08/27/2018 175  150 - 400 K/uL Final  . nRBC 08/27/2018 0.0  0.0 - 0.2 % Final  . Neutrophils Relative % 08/27/2018 67  % Final  . Neutro Abs 08/27/2018 2.8  1.7 - 7.7 K/uL Final  .  Lymphocytes Relative 08/27/2018 26  % Final  . Lymphs Abs 08/27/2018 1.1  0.7 - 4.0 K/uL Final  . Monocytes Relative 08/27/2018 6  % Final  . Monocytes Absolute 08/27/2018 0.3  0.1 - 1.0 K/uL Final  . Eosinophils Relative 08/27/2018 0  % Final  . Eosinophils Absolute 08/27/2018 0.0  0.0 - 0.5 K/uL Final  . Basophils Relative 08/27/2018 0  % Final  . Basophils Absolute 08/27/2018 0.0  0.0 - 0.1 K/uL Final  . Immature Granulocytes 08/27/2018 1  % Final  . Abs Immature Granulocytes 08/27/2018 0.02  0.00 - 0.07 K/uL Final   Performed at [facility], [address], [place], [place] [address]  . Magnesium 08/27/2018 1.7  1.7 - 2.4 mg/dL Final   Performed at [facility], [address] [place], [place] [address]  Orders Only on 08/26/2018  Component Date Value Ref Range Status  . Magnesium 08/26/2018 1.6* 1.7 - 2.4 mg/dL Final   Performed at [facility], [address] [place], [place] [address]  . Blood Bank Specimen 08/26/2018 SAMPLE AVAILABLE FOR TESTING   Final  . Sample Expiration 08/26/2018    Final                   Value:08/29/2018 Performed at [facility], [address] [place], [place] [address]   . Sodium 08/26/2018 135  135 - 145 mmol/L Final  . Potassium 08/26/2018 4.4  3.5 - 5.1 mmol/L Final  . Chloride 08/26/2018 100  98 - 111 mmol/L Final  . CO2 08/26/2018 25  22 - 32 mmol/L Final  . Glucose, Bld 08/26/2018 93  70 - 99 mg/dL Final  . BUN 08/26/2018 18  6 - 20 mg/dL Final  . Creatinine, Ser 08/26/2018 0.83  0.44 - 1.00 mg/dL Final  . Calcium 08/26/2018 9.6  8.9 - 10.3 mg/dL Final  . Total Protein 08/26/2018 8.4* 6.5 - 8.1 g/dL Final  . Albumin 08/26/2018 4.2  3.5 - 5.0 g/dL Final  . AST 08/26/2018 45* 15 - 41 U/L Final  . ALT 08/26/2018 63* 0 - 44 U/L Final  . Alkaline Phosphatase 08/26/2018 193* 38 - 126 U/L Final  . Total Bilirubin 08/26/2018 0.5  0.3 - 1.2 mg/dL Final  . GFR calc non Af Amer 08/26/2018 >60  >60 mL/min Final  .  GFR calc Af Amer 08/26/2018 >60  >60 mL/min Final   Comment: (NOTE) The eGFR has been calculated using the CKD EPI equation. This calculation has not been validated in all clinical situations. eGFR's persistently <60 mL/min signify possible Chronic Kidney Disease.   Georgiann Hahn gap 08/26/2018 10  5 - 15 Final   Performed at [facility], [address] [place], [place] [address]  . WBC 08/26/2018 3.9* 4.0 - 10.5 K/uL Final  . RBC 08/26/2018 3.31* 3.87 - 5.11 MIL/uL Final  . Hemoglobin 08/26/2018 11.1* 12.0 - 15.0 g/dL Final  . HCT 08/26/2018 34.2* 36.0 - 46.0 % Final  . MCV 08/26/2018 103.3* 80.0 - 100.0 fL Final  . MCH 08/26/2018 33.5  26.0 - 34.0 pg Final  . MCHC 08/26/2018 32.5  30.0 - 36.0 g/dL Final  . RDW 08/26/2018 15.9* 11.5 - 15.5 % Final  . Platelets 08/26/2018 189  150 - 400 K/uL Final  . nRBC 08/26/2018 0.0  0.0 - 0.2 % Final  . Neutrophils Relative % 08/26/2018 57  % Final  . Neutro Abs 08/26/2018 2.3  1.7 - 7.7 K/uL Final  . Lymphocytes Relative 08/26/2018 31  % Final  . Lymphs Abs 08/26/2018 1.2  0.7 - 4.0 K/uL Final  . Monocytes Relative 08/26/2018 8  % Final  . Monocytes Absolute 08/26/2018 0.3  0.1 - 1.0 K/uL Final  . Eosinophils Relative 08/26/2018 2  % Final  . Eosinophils Absolute 08/26/2018 0.1  0.0 - 0.5 K/uL Final  . Basophils Relative 08/26/2018 1  % Final  . Basophils Absolute 08/26/2018 0.0  0.0 - 0.1 K/uL Final  . Immature Granulocytes 08/26/2018 1  % Final  . Abs Immature Granulocytes 08/26/2018 0.02  0.00 - 0.07 K/uL Final   Performed at [facility], [address] [place], [place] [address]    Assessment:  CAIA LOFARO is a 52 y.o. female with a history of stage IB1 cervical cancer s/p concurrent cisplatin and radiation (external beam and brachytherapy) from 11/2016 - 02/2017.    She was treated by Dr. Christene Slates at [facility] in [place], [place].  In 01/2017, she received cisplatin x 2 and  carboplatin x 1 (01/29/2017) due to ARF and XRT. Radiation was followed by T & O on 02/01/2017 and T & N on 02/10/2017 and 02/20/2017.  Course was complicated by weight loss (80 pounds), nausea, vomiting, electrolyte wasting (potassium and magnesium), and multiple hospitalizations.  PET scan in 06/2017 revealed enlarging pulmonary nodules but no evidence of abdominal disease per patient report.  She is s/p right ileocolectomy on 08/31/2017 for a small bowel obstruction.  She presented with abdominal pain.  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 PDL-1 testing reveals a CPS score of 15.  Chest, abdomen, and pelvis CT on 04/15/2018 revealed innumerable (> 100) cavitary nodules scattered in the lungs, moderately enlarging compared to the 11/08/2017 PET-CT, suspicious for metastatic disease. One index node in the RLL measures 1.0 x 1.1 cm.  There were no new nodules.  There was an ill-defined wall thickening in the rectosigmoid with surrounding stranding along fascia planes, probably sequela from prior radiation therapy.  There was multilevel lumbar impingement due to spondylosis and degenerative disc disease.  There was heterogeneous enhancement in the uterus (some possibly from prior radiation therapy).  PET scan on 04/23/2018 revealed numerous scattered solid and cavitary nodules in the lungs stable increased in size compared to the prior PET-CT from 11/08/2017. Largest nodule was 1.1 cm in the LUL (SUV 1.9).  These demonstrated low-grade metabolic activity up to a maximum SUV of about 2.3, increased from 11/08/2017.   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  She s/p 3 cycles of carboplatin and Taxol (06/04/2018 - 07/23/2018) with Margarette [place] support.     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  Testing was negative for prothrombin gene mutation, lupus anticoagulant panel, anticardiolipin antibodies, and beta-2 glycoprotein antibodies.  She was discharged on Xarelto.  She was admitted to [facility] from 12/03/2017 - 12/08/2017 with abdominal pain. She was not felt to have a uterine infarct.  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  The etiology was unclear.  She is hepatitis C positive.   Hepatitis C genotype is 2a/2c.  She has B12 deficiency.  B12 was 165 on 11/27/2017.  She began B12 injections on 11/29/2017.  B12 injections occur monthly at home.  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  She was diagnosed with colitis.  Abdomen and pelvic CT on 05/23/2018 revealed colonic wall thickening, consistent with colitis. Considerations include inflammatory or infectious causes. Ischemic causes would be less likely. She is s/p RIGHT hemicolectomy. She had a patent ileocolic anastomosis.  She received a 10 day course of ciprofloxacin and Flagyl.  She has  chronic diarrhea s/p right hemicolectomy.  She is on a multi-drug regimen (Colestid, Lomotil, tincture of opium, amitriptyline, and Viberzi).  Symptomatically, she is feeling better following IV fluids received on 08/26/2018.  Her dizziness has resolved.  She has not had diarrhea in the last 2 - 3 days.  Her stools are formed at this time on multi-drug regimen.  Eating and drinking well; weight stable.  Exam reveals diffuse abdominal tenderness.  WBC 4200 ([place] 2800).  Hemoglobin 10.2, hematocrit 31.3, MCV 103.6, platelets 175,000.  Electrolytes normal.  LFTs elevated;  AST 32, ALT 52, ALP 185, and total bilirubin 0.2 mg/dL.   Plan:   1. Labs today: CBC with differential, CMP, Mg 2. Cervical cancer  Fair tolerance of chemotherapy treatments thus far.  Unclear if chemotherapy is implicated as a causative factor  in disruption of her fragile GI system.  Nausea is controlled.  Diarrhea requiring multi-drug management.  Will continue to  HOLD cycle #4 carboplatin + paclitaxel with Neulasta support.  Anticipate restarting treatment next week. Patient has antiemetics and pain medications at home to use on a PRN basis. Patient  advising that the  prescribed interventions are adequate at this point. Continue all medications as previously prescribed.  3. Transaminitis  LFTs remain elevated.  AST 32, ALT 52, ALP 185, and total bilirubin 0.2 mg/dL  Complains of diffuse abdominal pain.  Followed by gastroenterology Marius Ditch, MD).   Multiple lab studies pending.  Patient scheduled for follow-up in December.  Will ask for appointment to be expedited. 4. Adrenal insufficiency  Cortisol level low at 2.7 ug/mL.  Patient with blood pressure changes early in the morning.  May benefit from low-dose steroids. 5. Nausea, vomiting, and diarrhea  Nausea vomiting well controlled with currently prescribed interventions.  Requires PRN use of several medications (ondansetron, promethazine, olanzapine) to control.  Diarrhea has improved on multi--drug treatment regimen.  Requires Colestid, Lomotil, tincture of opium, amitriptyline, and Viberzi at this point.  Patient with concerns about complexity of bowel regimen, and is asking for regimen to be simplified.  Spoke with Dr. Marius Ditch while patient in clinic today via secure messaging. Patient to be seen in GI clinic this week. 6. Dehydration and electrolyte derangements  Labs reviewed.  BUN 16 and creatinine 0.74 mg/dL (CrCl 88.4 mL/min).  All electrolytes noted to be normal.  Continue routine lab monitoring. 7. B12 deficiency  Continues on monthly parenteral B12 supplementation.  Injections given at home by patient's sister. 8. RTC in 1 week for MD assessment, labs (CBC with diff, CMP, Mg), and cycle #4 paclitaxel + carboplatin with Neulasta  support   Honor Loh, NP  08/27/2018, 2:37 PM   I saw and evaluated the patient, participating in the key portions of the service and reviewing pertinent diagnostic studies and records.  I reviewed the nurse practitioner's note and agree with the findings and the plan.  The assessment and plan were discussed with the patient.  Several questions were asked by the patient and answered.   Nolon Stalls, MD 08/27/2018,2:37 PM

## 2018-08-27 NOTE — Progress Notes (Signed)
Patient states she has not had any diarrhea in the past 3 days. She does have abdominal pain 4/10 today.

## 2018-08-28 ENCOUNTER — Inpatient Hospital Stay: Payer: Medicaid Other

## 2018-08-28 ENCOUNTER — Encounter: Payer: Self-pay | Admitting: Gastroenterology

## 2018-08-28 ENCOUNTER — Ambulatory Visit (INDEPENDENT_AMBULATORY_CARE_PROVIDER_SITE_OTHER): Payer: Medicaid Other | Admitting: Gastroenterology

## 2018-08-28 VITALS — BP 116/82 | HR 60 | Resp 16 | Wt 170.4 lb

## 2018-08-28 DIAGNOSIS — E2740 Unspecified adrenocortical insufficiency: Secondary | ICD-10-CM

## 2018-08-28 DIAGNOSIS — K529 Noninfective gastroenteritis and colitis, unspecified: Secondary | ICD-10-CM

## 2018-08-28 MED ORDER — HYDROCORTISONE 5 MG PO TABS: 5.0000 mg | ORAL_TABLET | Freq: Three times a day (TID) | ORAL | 0 refills | Status: AC

## 2018-08-28 NOTE — Progress Notes (Signed)
[name], MD [address]  [place]  [place], [place] [address]  Main: [phone]  Fax: [phone]    Gastroenterology Consultation  Referring Provider:     Arnetha Courser, MD Primary Care Physician:  Arnetha Courser, MD Primary Gastroenterologist:  Dr. [name] Reason for Consultation:     Chronic diarrhea        HPI:   MAKAIYAH SCHWEIGER is a 52 y.o. female referred by Dr. Hampton Abbot for consultation & management of and chronic unexplained diarrhea. Patient has history of cervical cancer underwent chemotherapy XRT and brachytherapy, appears to be stable, closely followed by oncology at [facility]. She had small bowel obstruction in 08/2017, underwent exploratory laparoscopy with ileocolectomy and primary anastomosis. 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 Currently on Xarelto  Patient reports that since her bowel resection, she has been experiencing several episodes of nonbloody diarrhea, up to 12 times per day and night associated with urgency, nocturnal diarrhea as well as fecal incontinence. She does report diffuse lower abdominal cramps as well as intermittent gas/bloating.patient reports that she may have seen worms in her stools on several occasions. She does drink well water. She denies recent travel outside the [place] or recent use of antibiotics. She did have C. Difficile infection in the past when she was undergoing chemotherapy for cervical cancer. She had stool studies for C. Difficile and other GI pathogens including ova and parasites in 10/2017 by Dr. Hampton Abbot and they were negative.She has tried Imodium and Lomotil with no relief. Therefore she was referred to GI by Dr. Hampton Abbot for further management. Patient denies any particular relation to food. Prior to her surgery, patient reports having alternating episodes of  diarrhea and constipation associated with abdominal cramps and bloating. She would have up to 2-3 soft bowel movements daily but not this severe.her weight has been stable. She denies nausea, vomiting, fever, night sweats. She denies rectal bleeding. She is found to have macrocytic anemia which has been stable. She is not on any B12 or iron supplements.   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 She does not have any evidence of chronic liver disease.  Follow-up visit 12/12/2017 Patient was admitted to the hospital early this month secondary to lower abdominal pain, CT revealed mild rectosigmoid thickening. She reports that her diarrhea resolved after initiation of cholestyramine. She is currently taking 4 g twice daily. Having one bowel movement daily. She denies rectal bleeding. Her anal fissure had also significantly improved since control of her diarrhea. She has not received topical nitroglycerin ointment. However, she continues to have lower abdominal pain, constant with no relation to food or bowel movements. She tried dicyclomine which did not help. She is seeing Dr. Mike Gip for follow-up of lung nodules and is scheduled to have a CT scan in 02/2018. Her disease has not progressed to date. She also has B12 deficiency for which she is taking B12 injections. She stopped taking NSAIDs  Follow-up visit 02/11/2018 Patient was at her weight has been stable. She feels like she is dependent on Bentyl  and cholestyramine for her diarrhea and abdominal pain. Her diarrhea recurs if she does not take cholestyramine. She takes 2 packets daily which in fact leads to constipation. Bentyl helps with her chronic lower  abdominal cramps. Medication for hepatitis C treatment has been approved, and waiting for the medicine to be shipped. She underwent colonoscopy which showed anal condylomata.  Follow up visit 05/31/2018 Patient underwent left knee replacement at [facility] on 04/24/2018. She also had laser ablation of anal condylomata on 02/22/2018 by Dr. Johney Maine. Both procedures went well. 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 Patient was started on chemotherapy on 05/15/2018. She developed abdominal pain with nausea, vomiting and diarrhea for last 2 weeks. She underwent CT abdomen and pelvis which revealed thickening of the colon, underwent stool studies including C. Difficile and other GI pathogen panel which came back negative. Patient is started on ciprofloxacin 3 days ago. I got a call from Ander Purpura, NP from [place] yesterday about her ongoing diarrhea. I recommended to increase cholestyramine to 3 times daily and add Lomotil. Patient is here as a urgent follow-up of her diarrhea  She took third dose of cholestyramine and took 1 dose of Lomotil last night and she had 1 soft bowel movement this morning. She thinks her diarrhea has slowed down. He otherwise denies any other GI symptoms  Follow-up visit 07/09/2018 Restarted chemotherapy for cervical cancer. She reports that her diarrhea recurred after starting chemotherapy. She reports lower abdominal cramps as well. Currently, experiencing very loose, muddy like bowel movements several times a day and sometimes at night. She is on cholestyramine 2 packets daily, Imodium twice daily, Lomotil twice daily. She had repeat stool studies by Dr. Mike Gip on and negative for infection. Her weight has been stable, she denies nausea, appetite is intact. She was also  taking oral magnesium which has been just stopped  Follow-up visit 08/19/2018 Patient made an urgent visit today due to worsening diarrhea since starting antibiotics for dental infection.  She is currently on amoxicillin.  She reports that for the last 1 month her diarrhea has been intermittent, had good days.  She reports the diarrhea really got worse after starting antibiotics.  Her last chemo was about a month ago.  She is waiting for her WBC count to be improved before starting next cycle of chemo.  She is currently on colestipol 1 pill daily, Viberzi 100 mg twice daily, started 2 days ago, tincture of opium 3 times daily, Lomotil once daily, budesonide 3 mg 3 pills daily.  Patient was hospitalized to [facility] few weeks ago, underwent EGD and colonoscopy.  Colonoscopy was unremarkable including biopsies.  EGD revealed moderate villous blunting on duodenal biopsies. However her TTG IgA was negative in the past.  Therefore, I started her empirically on budesonide She is tearful due to ongoing diarrhea, incontinence  Follow-up visit 08/28/2018 Patient made an urgent visit to see me today at the request of her oncologist, Dr. Mike Gip because of low serum cortisol levels.  Her morning cortisol level was 2.7 mcg/dL on 08/20/2018.  Patient is in pleasant mood today as her diarrhea significantly improved.  Currently having upto 2, pudding like stools daily without nocturnal diarrhea.  She thinks adding amitriptyline definitely helped with her diarrhea as well as her mood.  She states that she is not "weepy" like she used to be.  Viberzi was  also a new medication that was added within last 10 days.  Currently, she is taking budesonide 9 mg daily, Viberzi 100 mg twice daily, Bentyl as needed, tincture of opium 3 times daily, colestipol with each meal.  She is not taking Lomotil regularly.  Planning to undergo fourth cycle of chemo in next 1 to 2 weeks.  She completed a course of antibiotics.  She denies smoking or  drinking alcohol.   NSAIDs: Has stopped taking NSAIDs  Antiplts/Anticoagulants/Anti thrombotics: on Xarelto for factor V bleeding mutation  GI Procedures: EGD and colonoscopy 07/30/2018 DIAGNOSIS:  A. DUODENUM; COLD BIOPSY:  - MODERATE DUODENOPATHY WITH VILLOUS BLUNTING, SEE COMMENT.   Comment:  The duodenal biopsies show shortened and shrunken villi with reactive  changes of surface epithelium. Intraepithelial lymphocytes are not  increased. Peptic duodenitis and medication effect should be considered.  The features are not typical for malabsorption, but clinical correlation  is recommended.   B. NEO-TERMINAL ILEUM; COLD BIOPSY:  - ILEAL MUCOSA WITH INTACT VILLI.  - NEGATIVE FOR ACTIVE INFLAMMATION, INTRAEPITHELIAL LYMPHOCYTOSIS,  INFECTIOUS AGENTS, AND GRANULOMAS.  - NEGATIVE FOR DYSPLASIA AND MALIGNANCY.   C. COLON; RANDOM COLD BIOPSY:  - NONSPECIFIC CRYPT HYPERPLASIA.  - NEGATIVE FOR ACTIVE INFLAMMATION AND MICROSCOPIC COLITIS.  - NEGATIVE FOR DYSPLASIA AND MALIGNANCY.   EGD 12/30/2017 - Normal duodenal bulb and second portion of the duodenum. - Erythematous mucosa in the stomach. Biopsied. - Normal gastroesophageal junction and esophagus.  Colonoscopy 12/30/2017 - Perianal condylomata found on perianal exam. - Patent end-to-side ileo-colonic anastomosis, characterized by healthy appearing mucosa. - The examined portion of the ileum was normal. Biopsied. - Multiple non-bleeding colonic angioectasias. - Normal mucosa in the entire examined colon. Biopsied. DIAGNOSIS:  A. STOMACH, ANTRUM; COLD BIOPSY:  - MILD CHRONIC GASTRITIS AND REGENERATIVE/REPARATIVE CHANGE.  - NEGATIVE FOR H. PYLORI, DYSPLASIA AND MALIGNANCY.   B. STOMACH, BODY; COLD BIOPSY:  - MINIMAL CHRONIC GASTRITIS AND REGENERATIVE/REPARATIVE CHANGE.  - NEGATIVE FOR H. PYLORI, DYSPLASIA AND MALIGNANCY.   Note: The histologic findings in the antrum and body resemble reactive  gastropathy. Etiologies  include drugs/chemical injury (NSAIDs vs.  other), bile reflux, and changes adjacent to an area of healing  ulceration. Clinical correlation with endoscopic findings is required.   C. TERMINAL ILEUM; COLD BIOPSY:  - PARTIALLY FRAGMENTED SMALL BOWEL MUCOSA WITH OVERALL INTACT VILLOUS  ARCHITECTURE.  - NEGATIVE FOR INTRAEPITHELIAL LYMPHOCYTOSIS, DYSPLASIA AND MALIGNANCY.   D. RANDOM COLON; COLD BIOPSY:  - COLONIC MUCOSA NEGATIVE FOR MICROSCOPIC COLITIS, DYSPLASIA AND  MALIGNANCY.   Colonoscopy at [facility] 10/05/2016 Impression:           - One diminutive polyp in the cecum,                        removed with a cold biopsy forceps.                        Resected and retrieved.                       - One 5 mm polyp in the cecum,                        removed with a jumbo cold forceps.                        Resected and retrieved.                       -  One 6 mm polyp in the cecum,                        removed with a hot snare. Resected                        and retrieved.                       - One 5 mm polyp in the descending                        colon, removed with a jumbo cold                        forceps. Resected and retrieved. Date Collected: 10/05/2016 14:48 Diagnosis A.COLON, LABELED AS "CECAL POLYP", BIOPSY:  TUBULAR ADENOMA  SESSILE SERRATED ADENOMA  B.COLON, LABELED AS "TRANSVERSE COLON POLYP", BIOPSY:  TUBULAR ADENOMA  She denies family history of celiac disease, autoimmune conditions, IBD, GI malignancy  Past Medical History:  Diagnosis Date  . Abnormal cervical Papanicolaou smear 09/18/2017  . Anxiety   . Aortic atherosclerosis ([place])   . Arthritis    neck and knees  . 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 Blood transfusion without reported diagnosis   . Cervical cancer (HCC)    mets lung  . Chronic anal fissure   . Chronic diarrhea   . Dyspnea   . Factor V Leiden mutation ([place])   . Fecal incontinence   . Genital warts    . GERD (gastroesophageal reflux disease)   . Heart murmur   . Hemorrhoids   . 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 History of cancer chemotherapy    completed 06/2017  . History of Clostridium difficile infection    while undergoing chemo.  Negative test 10/2017  . 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 Intestinal infection due to Clostridium difficile 09/18/2017  . Macrocytic anemia with vitamin B12 deficiency   . Perianal condylomata   . Pneumonia    History of  . Pulmonary nodules   . Rectal bleeding   . Small bowel obstruction ([place]) 08/2017  . Stiff neck    limited right turn  . Vitamin D deficiency     Past Surgical History:  Procedure Laterality Date  . CHOLECYSTECTOMY    . COLON SURGERY  08/2017   resection  . COLONOSCOPY WITH PROPOFOL N/A 12/20/2017   Procedure: COLONOSCOPY WITH PROPOFOL;  Surgeon: [name], MD;  Location: [facility] ENDOSCOPY;  Service: Gastroenterology;  Laterality: N/A;  . COLONOSCOPY WITH PROPOFOL N/A 07/30/2018   Procedure: COLONOSCOPY WITH PROPOFOL;  Surgeon: [name], MD;  Location: [facility] ENDOSCOPY;  Service: Gastroenterology;  Laterality: N/A;  . DIAGNOSTIC LAPAROSCOPY    . ESOPHAGOGASTRODUODENOSCOPY (EGD) WITH PROPOFOL N/A 12/20/2017   Procedure: ESOPHAGOGASTRODUODENOSCOPY (EGD) WITH PROPOFOL;  Surgeon: [name], MD;  Location: [facility];  Service: Gastroenterology;  Laterality: N/A;  . ESOPHAGOGASTRODUODENOSCOPY (EGD) WITH PROPOFOL  07/30/2018   Procedure: ESOPHAGOGASTRODUODENOSCOPY (EGD) WITH PROPOFOL;  Surgeon: [name], MD;  Location: [facility] ENDOSCOPY;  Service: Gastroenterology;;  . LAPAROTOMY N/A 08/31/2017   Procedure: EXPLORATORY LAPAROTOMY for SBO, ileocolectomy, removal of piece of uterine wall;  Surgeon: [name], MD;  Location: [facility] ORS;  Service: General;  Laterality: N/A;  . LASER ABLATION CONDOLAMATA N/A 02/22/2018   Procedure: LASER  ABLATION/REMOVAL OF CONDOLAMATA AROUND ANUS AND VAGINA;  Surgeon: Michael Boston, MD;  Location: [facility];  Service: General;  Laterality: N/A;  . OOPHORECTOMY    . PORTA CATH INSERTION N/A 05/13/2018   Procedure: PORTA CATH INSERTION;  Surgeon: Katha Cabal, MD;  Location: [place] CV LAB;  Service: Cardiovascular;  Laterality: N/A;  . SMALL INTESTINE SURGERY    . TANDEM RING INSERTION     x3  . THORACOTOMY    . TOTAL KNEE ARTHROPLASTY Left 04/24/2018   Procedure: TOTAL KNEE ARTHROPLASTY;  Surgeon: Lovell Sheehan, MD;  Location: [facility] ORS;  Service: Orthopedics;  Laterality: Left;    Current Outpatient Medications:  .  amitriptyline (ELAVIL) 50 MG tablet, Take 1 tablet (50 mg total) by mouth at bedtime., Disp: 30 tablet, Rfl: 2 .  budesonide (ENTOCORT EC) 3 MG 24 hr capsule, Take 3 capsules (9 mg total) by mouth daily., Disp: 90 capsule, Rfl: 2 .  Calcium Carb-Cholecalciferol (CALCIUM 500 +D) 500-400 MG-UNIT TABS, Take 2 tablets by mouth daily., Disp: , Rfl:  .  colestipol (COLESTID) 1 g tablet, Take 2 tablets (2 g total) by mouth 3 (three) times daily with meals., Disp: 180 tablet, Rfl: 0 .  cyanocobalamin (,VITAMIN B-12,) 1000 MCG/ML injection, Inject 1ML every week x 4 weeks, then 1ML every other week x 2 months, then monthly x 3 months (Patient taking differently: Inject 1,000 mcg into the muscle every 30 (thirty) days. Inject 1ML every week x 4 weeks, then 1ML every other week x 2 months, then monthly x 3 months), Disp: 10 mL, Rfl: 1 .  dicyclomine (BENTYL) 20 MG tablet, Take 1 tablet (20 mg total) by mouth every 6 (six) hours. (Patient taking differently: Take 20 mg by mouth every 6 (six) hours. Taking as needed), Disp: 20 tablet, Rfl: 0 .  Eluxadoline (VIBERZI) 100 MG TABS, Take 1 tablet (100 mg total) by mouth 2 (two) times daily with a meal., Disp: 60 tablet, Rfl: 0 .  Multiple Vitamins-Minerals (MULTIVITAMIN WITH MINERALS) tablet, Take 1 tablet by mouth  daily., Disp: 30 tablet, Rfl: 0 .  OLANZapine zydis (ZYPREXA) 5 MG disintegrating tablet, Take 1 tablet (5 mg total) by mouth at bedtime., Disp: 30 tablet, Rfl: 0 .  ondansetron (ZOFRAN) 8 MG tablet, Take 1 tablet (8 mg total) by mouth every 8 (eight) hours as needed for nausea or vomiting., Disp: 30 tablet, Rfl: 3 .  Opium 10 MG/ML (1%) TINC, Take 1 mL (10 mg total) by mouth 3 (three) times daily with meals., Disp: 473 mL, Rfl: 0 .  pantoprazole (PROTONIX) 40 MG tablet, Take 1 tablet (40 mg total) by mouth daily., Disp: 30 tablet, Rfl: 0 .  promethazine (PHENERGAN) 25 MG tablet, Take 1 tablet (25 mg total) by mouth every 6 (six) hours as needed for nausea or vomiting., Disp: 30 tablet, Rfl: 1 .  rivaroxaban (XARELTO) 20 MG TABS tablet, Take 1 tablet (20 mg total) by mouth daily with supper., Disp: 30 tablet, Rfl: 3 .  Syringe/Needle, Disp, (SYRINGE 3CC/22GX1") 22G X 1" 3 ML MISC, For use with Vitamin B12 injections, Disp: 50 each, Rfl: 0 .  traZODone (DESYREL) 50 MG tablet, Take 0.5-1 tablets (25-50 mg total) by mouth at bedtime as needed for sleep., Disp: 30 tablet, Rfl: 0 .  VENTOLIN HFA 108 (90 Base) MCG/ACT inhaler, Inhale 1-2 puffs into the lungs every 4 (  four) hours as needed for shortness of breath. , Disp: , Rfl: 0 .  hydrocortisone (CORTEF) 5 MG tablet, Take 1 tablet (5 mg total) by mouth 3 (three) times daily., Disp: 90 tablet, Rfl: 0 No current facility-administered medications for this visit.   Facility-Administered Medications Ordered in Other Visits:  .  heparin lock flush 100 unit/mL, 500 Units, Intravenous, Once, Cor[place]an, Drue Second, MD   Family History  Problem Relation Age of Onset  . Hypertension Father   . Diabetes Father   . Alcohol abuse Daughter   . Hypertension Maternal Grandmother   . Diabetes Maternal Grandmother   . Diabetes Paternal Grandmother   . Hypertension Paternal Grandmother      Social History   Tobacco Use  . Smoking status: Former Smoker    Last  attempt to quit: 10/16/2006    Years since quitting: 11.8  . Smokeless tobacco: Never Used  Substance Use Topics  . Alcohol use: Yes    Frequency: Never    Comment: seldom  . Drug use: Yes    Types: Marijuana    Allergies as of 08/28/2018 - Review Complete 08/28/2018  Allergen Reaction Noted  . Ketamine Anxiety and Other (See Comments) 02/22/2017    Review of Systems:    All systems reviewed and negative except where noted in HPI.   Physical Exam:  BP 116/82 (BP Location: Left Arm, Patient Position: Sitting, Cuff Size: Normal)   Pulse 60   Resp 16   Wt 170 lb 6.4 oz (77.3 kg)   BMI 26.69 kg/m  No LMP recorded. Patient is postmenopausal.  General:   Alert,  Well-developed, well-nourished, pleasant and cooperative in NAD Head:  Normocephalic and atraumatic. Eyes:  Sclera clear, no icterus.   Conjunctiva pink. Ears:  Normal auditory acuity. Nose:  No deformity, discharge, or lesions. Mouth:  No deformity or lesions,oropharynx pink & moist. Neck:  Supple; no masses or thyromegaly. Lungs:  Respirations even and unlabored.  Clear throughout to auscultation.   No wheezes, crackles, or rhonchi. No acute distress. Heart:  Regular rate and rhythm; no murmurs, clicks, rubs, or gallops. Abdomen:  Normal bowel sounds. Soft,mild diffuse lower abdominal tenderness, and non-distended without masses, hepatosplenomegaly or hernias noted.  No guarding or rebound tenderness.   Rectal: Not performed Msk:  Symmetrical without gross deformities. Good, equal movement & strength bilaterally. Pulses:  Normal pulses noted. Extremities:  No clubbing or edema.  No cyanosis. Neurologic:  Alert and oriented x3;  grossly normal neurologically. Skin:  Intact without significant lesions or rashes. No jaundice. Lymph Nodes:  No significant cervical adenopathy. Psych:  Alert and cooperative. Normal mood and affect.  Imaging Studies: reviewed  Assessment and Plan:   CHRISTAL LAGERSTROM is a 52 y.o.  Caucasian female with history of chronic hepatitis C, treatment nave, stage IV cervical adenocarcinoma, status post chemotherapy XRT and brachytherapy, ongoing treatment at [facility] cancer center, complicated by radiation-induced small bowel obstruction, S/P exploratory laparoscopy with ileocolectomy in 08/2017 seen in follow-up for chronic hepatitis C, chronic diarrhea  Chronic hep C: Genotype 2a/2c, VL 82,400  Start treatment of hepatitis C with Mavyret for 8 weeks, Patient is waiting to finish next cycle of chemotherapy so that the antiviral treatment is uninterrupted There is no evidence of chronic liver disease She had alkaline phosphatase Isoenzymes and liver component is elevated, GGT 209, elevated Hepatitis B surface antigen negative HIV nonreactive hepatitis B surface antibody, core antibody negative Other secondary causes for elevated LFTs are unremarkable  Chronic  nonbloody diarrhea: Her history is consistent with bile salt induced diarrhea with history of ileocolectomy. Pancreatic fecal elastase, fecal calprotectin are normal, CRP came back normal. No evidence of microscopic colitis. Worsening of diarrhea after chemotherapy, recent EGD revealed a moderate villous blunting on the duodenal biopsies.  Repeat colonoscopy was unremarkable.  Due to persistent diarrhea, I pursued further work-up.  Morning serum cortisol levels came back low at 2.7 mcg/dL. 5HIAA normal, celiac serologies TTG and DGP negative.  Serum gastrin mildly elevated which is clinically insignificant.  VIP and chromogranin A levels are pending. Her diarrhea has now significantly improved.  Due to low serum cortisol levels, I will refer her to endocrine to evaluate for adrenal insufficiency.  In the meantime, I will start her on hydrocortisone 5 mg 3 times a day.  She will slowly back off on some of the antidiarrheal medications including Lomotil, tincture of opium at this time.  I have advised her to back off on Viberzi after  starting hydrocortisone if her diarrhea continues to improve.  She will continue the following medications at this time  -Continue colestipol 1 pill each meal -Continue budesonide 3 mg 3 capsules daily -Continue amitriptyline 50 mg at bedtime -Continue Viberzi 100 mg twice daily -Start hydrocortisone 5 mg 3 times daily  Chronic anal fissure and symptomatic hemorrhoids: - Her fissure has significantly improved - I will defer outpatient hemorrhoid ligation for now - she underwent removal of anal condylomata  Follow up in 1 month or sooner if diarrhea persists  [name], MD

## 2018-08-28 NOTE — Progress Notes (Signed)
r 

## 2018-08-29 ENCOUNTER — Inpatient Hospital Stay: Payer: Medicaid Other

## 2018-08-29 ENCOUNTER — Other Ambulatory Visit: Payer: Self-pay

## 2018-08-29 DIAGNOSIS — C530 Malignant neoplasm of endocervix: Secondary | ICD-10-CM

## 2018-08-29 DIAGNOSIS — C538 Malignant neoplasm of overlapping sites of cervix uteri: Secondary | ICD-10-CM

## 2018-08-29 DIAGNOSIS — Z5111 Encounter for antineoplastic chemotherapy: Secondary | ICD-10-CM | POA: Diagnosis not present

## 2018-08-29 DIAGNOSIS — Z7901 Long term (current) use of anticoagulants: Secondary | ICD-10-CM

## 2018-08-29 LAB — CHROMOGRANIN A: Chromogranin A: 4 nmol/L (ref 0–5)

## 2018-09-03 ENCOUNTER — Encounter: Payer: Self-pay | Admitting: Hematology and Oncology

## 2018-09-03 ENCOUNTER — Inpatient Hospital Stay: Payer: Medicaid Other

## 2018-09-03 ENCOUNTER — Inpatient Hospital Stay (HOSPITAL_BASED_OUTPATIENT_CLINIC_OR_DEPARTMENT_OTHER): Payer: Medicaid Other | Admitting: Hematology and Oncology

## 2018-09-03 VITALS — BP 126/76 | HR 79 | Temp 97.6°F | Resp 18 | Wt 169.3 lb

## 2018-09-03 DIAGNOSIS — Z5111 Encounter for antineoplastic chemotherapy: Secondary | ICD-10-CM

## 2018-09-03 DIAGNOSIS — E2740 Unspecified adrenocortical insufficiency: Secondary | ICD-10-CM | POA: Insufficient documentation

## 2018-09-03 DIAGNOSIS — E538 Deficiency of other specified B group vitamins: Secondary | ICD-10-CM

## 2018-09-03 DIAGNOSIS — E876 Hypokalemia: Secondary | ICD-10-CM | POA: Diagnosis not present

## 2018-09-03 DIAGNOSIS — C538 Malignant neoplasm of overlapping sites of cervix uteri: Secondary | ICD-10-CM | POA: Diagnosis not present

## 2018-09-03 DIAGNOSIS — C7800 Secondary malignant neoplasm of unspecified lung: Secondary | ICD-10-CM | POA: Diagnosis not present

## 2018-09-03 DIAGNOSIS — K529 Noninfective gastroenteritis and colitis, unspecified: Secondary | ICD-10-CM

## 2018-09-03 DIAGNOSIS — Z79899 Other long term (current) drug therapy: Secondary | ICD-10-CM

## 2018-09-03 LAB — CBC WITH DIFFERENTIAL/PLATELET
Abs Immature Granulocytes: 0.01 10*3/uL (ref 0.00–0.07)
Basophils Absolute: 0 10*3/uL (ref 0.0–0.1)
Basophils Relative: 0 %
Eosinophils Absolute: 0.1 10*3/uL (ref 0.0–0.5)
Eosinophils Relative: 2 %
HCT: 31.8 % — ABNORMAL LOW (ref 36.0–46.0)
Hemoglobin: 10.1 g/dL — ABNORMAL LOW (ref 12.0–15.0)
Immature Granulocytes: 0 %
Lymphocytes Relative: 32 %
Lymphs Abs: 1 10*3/uL (ref 0.7–4.0)
MCH: 33.1 pg (ref 26.0–34.0)
MCHC: 31.8 g/dL (ref 30.0–36.0)
MCV: 104.3 fL — ABNORMAL HIGH (ref 80.0–100.0)
Monocytes Absolute: 0.2 10*3/uL (ref 0.1–1.0)
Monocytes Relative: 8 %
Neutro Abs: 1.8 10*3/uL (ref 1.7–7.7)
Neutrophils Relative %: 58 %
Platelets: 159 10*3/uL (ref 150–400)
RBC: 3.05 MIL/uL — ABNORMAL LOW (ref 3.87–5.11)
RDW: 14.8 % (ref 11.5–15.5)
WBC: 3.1 10*3/uL — ABNORMAL LOW (ref 4.0–10.5)
nRBC: 0 % (ref 0.0–0.2)

## 2018-09-03 LAB — COMPREHENSIVE METABOLIC PANEL
ALT: 34 U/L (ref 0–44)
AST: 30 U/L (ref 15–41)
Albumin: 3.7 g/dL (ref 3.5–5.0)
Alkaline Phosphatase: 143 U/L — ABNORMAL HIGH (ref 38–126)
Anion gap: 9 (ref 5–15)
BUN: 13 mg/dL (ref 6–20)
CO2: 25 mmol/L (ref 22–32)
Calcium: 9 mg/dL (ref 8.9–10.3)
Chloride: 104 mmol/L (ref 98–111)
Creatinine, Ser: 0.79 mg/dL (ref 0.44–1.00)
GFR calc Af Amer: >60 mL/min (ref >60)
GFR calc non Af Amer: >60 mL/min (ref >60)
Glucose, Bld: 125 mg/dL — ABNORMAL HIGH (ref 70–99)
Potassium: 3.8 mmol/L (ref 3.5–5.1)
Sodium: 138 mmol/L (ref 135–145)
Total Bilirubin: 0.4 mg/dL (ref 0.3–1.2)
Total Protein: 7.2 g/dL (ref 6.5–8.1)

## 2018-09-03 LAB — MAGNESIUM: Magnesium: 1.4 mg/dL — ABNORMAL LOW (ref 1.7–2.4)

## 2018-09-03 MED ORDER — SODIUM CHLORIDE 0.9 % IV SOLN
500.0000 mg | Freq: Once | INTRAVENOUS | Status: AC
  Administered 2018-09-03: 500 mg via INTRAVENOUS
  Filled 2018-09-03: qty 50

## 2018-09-03 MED ORDER — MAGNESIUM SULFATE 4 GM/100ML IV SOLN
4.0000 g | Freq: Once | INTRAVENOUS | Status: AC
  Administered 2018-09-03: 4 g via INTRAVENOUS
  Filled 2018-09-03: qty 100

## 2018-09-03 MED ORDER — HEPARIN SOD (PORK) LOCK FLUSH 100 UNIT/ML IV SOLN
500.0000 [IU] | Freq: Once | INTRAVENOUS | Status: AC | PRN
  Administered 2018-09-03: 500 [IU]
  Filled 2018-09-03: qty 5

## 2018-09-03 MED ORDER — DIPHENHYDRAMINE HCL 50 MG/ML IJ SOLN
50.0000 mg | Freq: Once | INTRAMUSCULAR | Status: AC
  Administered 2018-09-03: 50 mg via INTRAVENOUS
  Filled 2018-09-03: qty 1

## 2018-09-03 MED ORDER — SODIUM CHLORIDE 0.9 % IV SOLN
Freq: Once | INTRAVENOUS | Status: AC
  Administered 2018-09-03: 10:00:00 via INTRAVENOUS
  Filled 2018-09-03: qty 250

## 2018-09-03 MED ORDER — PALONOSETRON HCL INJECTION 0.25 MG/5ML
0.2500 mg | Freq: Once | INTRAVENOUS | Status: AC
  Administered 2018-09-03: 0.25 mg via INTRAVENOUS
  Filled 2018-09-03: qty 5

## 2018-09-03 MED ORDER — FAMOTIDINE IN NACL 20-0.9 MG/50ML-% IV SOLN: 20.0000 mg | Freq: Once | INTRAVENOUS | Status: DC

## 2018-09-03 MED ORDER — SODIUM CHLORIDE 0.9 % IV SOLN: 492.8000 mg | Freq: Once | INTRAVENOUS | Status: DC

## 2018-09-03 MED ORDER — SODIUM CHLORIDE 0.9 % IV SOLN
Freq: Once | INTRAVENOUS | Status: AC
  Administered 2018-09-03: 11:00:00 via INTRAVENOUS
  Filled 2018-09-03: qty 50

## 2018-09-03 MED ORDER — SODIUM CHLORIDE 0.9 % IV SOLN
155.0000 mg/m2 | Freq: Once | INTRAVENOUS | Status: AC
  Administered 2018-09-03: 294 mg via INTRAVENOUS
  Filled 2018-09-03: qty 49

## 2018-09-03 MED ORDER — SODIUM CHLORIDE 0.9 % IV SOLN
20.0000 mg | Freq: Once | INTRAVENOUS | Status: AC
  Administered 2018-09-03: 20 mg via INTRAVENOUS
  Filled 2018-09-03: qty 2

## 2018-09-03 NOTE — Progress Notes (Signed)
Patient states she is feeling much better.  Stools are formed.  Appetite good. Some exertional SOB.  Otherwise, offers no complaints.

## 2018-09-03 NOTE — Progress Notes (Signed)
[facility] day:  09/03/2018   Chief Complaint: Joanna Hall is a 52 y.o. female with stage IV cervical cancer who is seen for 1 week assessment prior to cycle #4 carboplatin and Taxol.  HPI:  The patient was last seen in the medical oncology clinic on 08/27/2018.  At that time, she was feeling better following IV fluids received on 08/26/2018.  Her dizziness had resolved.  She had not had diarrhea for 2 to 3 days.  Her stools were formed at this time on multi-drug regimen (Colestid, Lomotil, tincture of opium, amitriptyline, and Viberzi).  She was eating and drinking well; weight stable.  Exam revealed diffuse abdominal tenderness.  WBC was 4200 ([place] 2800).  Hemoglobin was 10.2, hematocrit 31.3, MCV 103.6, platelets 175,000.  Electrolytes were normal.  LFTs were elevated with an AST 32, ALT 52, ALP 185, and total bilirubin 0.2.   Chemotherapy was held.  She had a follow-up with Dr. Marius Ditch.  She saw Dr. Marius Ditch on 08/28/2018.  Notes reviewed.  She noted up to 2, pudding like stools daily without nocturnal diarrhea.  She felt adding amitriptyline definitely helped with her diarrhea as well as her mood.  She was not "weepy" like she used to be.  Viberzi was added within last 10 days.  Currently, she was taking budesonide 9 mg daily, Viberzi 100 mg twice daily, Bentyl as needed, tincture of opium 3 times daily, colestipol with each meal.  She was not taking Lomotil regularly.  She completed a course of antibiotics.  Due to persistent diarrhea, further GI work-up revealed a morning serum cortisol low at 2.7 mcg/dL. 5HIAA normal, celiac serologies TTG and DGP were negative.  Serum gastrin was mildly elevated which is clinically insignificant.  VIP and chromogranin A levels are pending. Her diarrhea was felt significantly improved.  Due to low serum cortisol levels, she was referred to endocrinology to evaluate for adrenal insufficiency.  She was started on  hydrocortisone 5 mg 3 times a day.  She will slowly back off on some of the antidiarrheal medications including Lomotil, tincture of opium at this time.  She was advised her to back off on Viberzi after starting hydrocortisone if her diarrhea continues to improve.  She will continue the following medications at this time.  She has a follow-up in 1 month.  Regimen:  1) colestipol 1 pill each meal  2)  budesonide 3 mg 3 capsules daily 3)  amitriptyline 50 mg at bedtime 4)  Viberzi 100 mg twice daily  During the interim, she has not had any diarrhea since her last visit.  She has felt good.  She has felt stronger.  Appetite has improved.  She denies any abdominal pain.  She has a little cramping with bowel movements.  She has gone from 9 medications to 4 medications.  She remains short of breath if she goes upstairs.  She always has heartburn.  She has less dizziness.   Past Medical History:  Diagnosis Date  . Abnormal cervical Papanicolaou smear 09/18/2017  . Anxiety   . Aortic atherosclerosis ([place])   . Arthritis    neck and knees  . 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 Blood transfusion without reported diagnosis   . Cervical cancer (HCC)    mets lung  . Chronic anal fissure   . Chronic diarrhea   . Dyspnea   . Erosive gastropathy 09/18/2017  . Factor V  Leiden mutation ([place])   . Fecal incontinence   . Genital warts   . GERD (gastroesophageal reflux disease)   . Heart murmur   . Hemorrhoids   . 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 Hepatitis, chronic ([place]) 05/05/2017  . History of cancer chemotherapy    completed 06/2017  . History of Clostridium difficile infection    while undergoing chemo.  Negative test 10/2017  . Ileocolic anastomotic leak   . 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 Intestinal infection due to Clostridium difficile 09/18/2017  . Macrocytic anemia with vitamin B12 deficiency   . Nausea vomiting and  diarrhea   . Pancolitis ([place]) 07/27/2018  . Perianal condylomata   . Pneumonia    History of  . Pulmonary nodules   . Rectal bleeding   . Small bowel obstruction ([place]) 08/2017  . Stiff neck    limited right turn  . Vitamin D deficiency     Past Surgical History:  Procedure Laterality Date  . CHOLECYSTECTOMY    . COLON SURGERY  08/2017   resection  . COLONOSCOPY WITH PROPOFOL N/A 12/20/2017   Procedure: COLONOSCOPY WITH PROPOFOL;  Surgeon: [name], MD;  Location: [facility] ENDOSCOPY;  Service: Gastroenterology;  Laterality: N/A;  . COLONOSCOPY WITH PROPOFOL N/A 07/30/2018   Procedure: COLONOSCOPY WITH PROPOFOL;  Surgeon: [name], MD;  Location: [facility] ENDOSCOPY;  Service: Gastroenterology;  Laterality: N/A;  . DIAGNOSTIC LAPAROSCOPY    . ESOPHAGOGASTRODUODENOSCOPY (EGD) WITH PROPOFOL N/A 12/20/2017   Procedure: ESOPHAGOGASTRODUODENOSCOPY (EGD) WITH PROPOFOL;  Surgeon: [name], MD;  Location: [facility];  Service: Gastroenterology;  Laterality: N/A;  . ESOPHAGOGASTRODUODENOSCOPY (EGD) WITH PROPOFOL  07/30/2018   Procedure: ESOPHAGOGASTRODUODENOSCOPY (EGD) WITH PROPOFOL;  Surgeon: [name], MD;  Location: [facility] ENDOSCOPY;  Service: Gastroenterology;;  . LAPAROTOMY N/A 08/31/2017   Procedure: EXPLORATORY LAPAROTOMY for SBO, ileocolectomy, removal of piece of uterine wall;  Surgeon: [name], MD;  Location: [facility] ORS;  Service: General;  Laterality: N/A;  . LASER ABLATION CONDOLAMATA N/A 02/22/2018   Procedure: LASER ABLATION/REMOVAL OF [id number] AROUND ANUS AND VAGINA;  Surgeon: Michael Boston, MD;  Location: [facility];  Service: General;  Laterality: N/A;  . OOPHORECTOMY    . PORTA CATH INSERTION N/A 05/13/2018   Procedure: PORTA CATH INSERTION;  Surgeon: Katha Cabal, MD;  Location: [facility];  Service: Cardiovascular;  Laterality: N/A;  . SMALL INTESTINE SURGERY    . TANDEM RING INSERTION     x3  .  THORACOTOMY    . TOTAL KNEE ARTHROPLASTY Left 04/24/2018   Procedure: TOTAL KNEE ARTHROPLASTY;  Surgeon: Lovell Sheehan, MD;  Location: [facility] ORS;  Service: Orthopedics;  Laterality: Left;    Family History  Problem Relation Age of Onset  . Hypertension Father   . Diabetes Father   . Alcohol abuse Daughter   . Hypertension Maternal Grandmother   . Diabetes Maternal Grandmother   . Diabetes Paternal Grandmother   . Hypertension Paternal Grandmother     Social History:  reports that she quit smoking about 11 years ago. She has never used smokeless tobacco. She reports current alcohol use. She reports current drug use. Drug: Marijuana.  The patient's 56 year old daughter died of alcoholism.  The patient is originally from [place].  She moved from [place] to [place]. She is alone today.   Allergies:  Allergies  Allergen Reactions  . Ketamine Anxiety and Other (See Comments)    Syncope episode/confusion     Current Medications: Current Outpatient Medications  Medication Sig Dispense Refill  . amitriptyline (ELAVIL) 50 MG tablet Take 1 tablet (50 mg total) by mouth at bedtime. 30 tablet 2  . Calcium Carb-Cholecalciferol (CALCIUM 500 +D) 500-400 MG-UNIT TABS Take 2 tablets by mouth daily.    . colestipol (COLESTID) 1 g tablet Take 2 tablets (2 g total) by mouth 3 (three) times daily with meals. 180 tablet 0  . cyanocobalamin (,VITAMIN B-12,) 1000 MCG/ML injection Inject 1ML every week x 4 weeks, then 1ML every other week x 2 months, then monthly x 3 months (Patient taking differently: Inject 1,000 mcg into the muscle every 30 (thirty) days. Inject 1ML every week x 4 weeks, then 1ML every other week x 2 months, then monthly x 3 months) 10 mL 1  . Eluxadoline (VIBERZI) 100 MG TABS Take 1 tablet (100 mg total) by mouth 2 (two) times daily with a meal. 60 tablet 0  . Multiple Vitamins-Minerals (MULTIVITAMIN WITH MINERALS) tablet Take 1 tablet by mouth daily. 30 tablet 0  .  OLANZapine zydis (ZYPREXA) 5 MG disintegrating tablet Take 1 tablet (5 mg total) by mouth at bedtime. 30 tablet 0  . ondansetron (ZOFRAN) 8 MG tablet Take 1 tablet (8 mg total) by mouth every 8 (eight) hours as needed for nausea or vomiting. 30 tablet 3  . pantoprazole (PROTONIX) 40 MG tablet Take 1 tablet (40 mg total) by mouth daily. 30 tablet 0  . promethazine (PHENERGAN) 25 MG tablet Take 1 tablet (25 mg total) by mouth every 6 (six) hours as needed for nausea or vomiting. 30 tablet 1  . rivaroxaban (XARELTO) 20 MG TABS tablet Take 1 tablet (20 mg total) by mouth daily with supper. 30 tablet 3  . Syringe/Needle, Disp, (SYRINGE 3CC/22GX1") 22G X 1" 3 ML MISC For use with Vitamin B12 injections 50 each 0  . traZODone (DESYREL) 50 MG tablet Take 0.5-1 tablets (25-50 mg total) by mouth at bedtime as needed for sleep. 30 tablet 0  . VENTOLIN HFA 108 (90 Base) MCG/ACT inhaler Inhale 1-2 puffs into the lungs every 4 (four) hours as needed for shortness of breath.   0  . HYDROcodone-acetaminophen (NORCO/VICODIN) 5-325 MG tablet Take 1 tablet by mouth every 6 (six) hours as needed for moderate pain. 60 tablet 0   No current facility-administered medications for this visit.    Facility-Administered Medications Ordered in Other Visits  Medication Dose Route Frequency Provider Last Rate Last Dose  . heparin lock flush 100 unit/mL  500 Units Intravenous Once Lequita Asal, MD         Review of Systems  Constitutional: Negative for chills, diaphoresis, fever, malaise/fatigue and weight loss (up 2 pounds).       Feels good.  Feels stronger.  HENT: Negative.  Negative for congestion, ear discharge, ear pain, nosebleeds, sinus pain and sore throat.   Eyes: Negative.  Negative for blurred vision, double vision, photophobia, pain, discharge and redness.  Respiratory: Positive for shortness of breath (going up stairs). Negative for cough, hemoptysis, sputum production and wheezing.   Cardiovascular:  Negative for chest pain, palpitations, orthopnea, leg swelling and PND.  Gastrointestinal: Positive for heartburn (on PPI). Negative for abdominal pain, blood in stool, constipation, diarrhea (improved with multi-drug management), melena, nausea and vomiting.       Appetite improved.  Genitourinary: Negative for dysuria, frequency, hematuria and urgency.  Musculoskeletal: Positive for joint pain (OA in shoulders). Negative for back pain, falls, myalgias and neck pain.       Achy legs.  Skin: Negative.  Negative for itching and rash.  Neurological: Positive for dizziness (improved after IVFs). Negative for tremors, sensory change, speech change, focal weakness, weakness and headaches.  Endo/Heme/Allergies: Does not bruise/bleed easily.  Psychiatric/Behavioral: Negative for depression and memory loss. The patient is not nervous/anxious and does not have insomnia.   All other systems reviewed and are negative.  Performance status (ECOG): 1  Vital Signs BP 126/76 (BP Location: Left Arm, Patient Position: Sitting)   Pulse 79   Temp 97.6 F (36.4 C) (Tympanic)   Resp 18   Wt 169 lb 5 oz (76.8 kg)   BMI 26.52 kg/m   Physical Exam  Constitutional: She is oriented to person, place, and time and well-developed, well-nourished, and in no distress.  HENT:  Head: Normocephalic and atraumatic.  Mouth/Throat: Oropharynx is clear and moist and mucous membranes are normal. No oropharyngeal exudate.  Eyes: Pupils are equal, round, and reactive to light. Conjunctivae and EOM are normal. No scleral icterus.  Neck: Normal range of motion. Neck supple. No JVD present.  Cardiovascular: Normal rate, regular rhythm, normal heart sounds and intact distal pulses. Exam reveals no gallop and no friction rub.  No murmur heard. Pulmonary/Chest: Effort normal and breath sounds normal. No respiratory distress. She has no wheezes. She has no rales.  Abdominal: Soft. Bowel sounds are normal. She exhibits no  distension and no mass. There is no abdominal tenderness. There is no rebound and no guarding.  Musculoskeletal: Normal range of motion.        General: No tenderness or edema.  Lymphadenopathy:    She has no cervical adenopathy.    She has no axillary adenopathy.       Right: No inguinal and no supraclavicular adenopathy present.       Left: No inguinal and no supraclavicular adenopathy present.  Neurological: She is alert and oriented to person, place, and time. Gait normal.  Skin: Skin is warm and dry. No rash noted. No erythema.  Psychiatric: Mood, affect and judgment normal.  Nursing note reviewed.   Imaging studies: 06/2017:  PET scan revealed enlarging pulmonary nodules but no evidence of abdominal disease per patient report. 09/13/2017:  Abdomen and pelvic CT revealed a small infarct in the inferior pole of the LEFT kidney and RIGHT uterine infarct. 11/08/2017:  PET scan at the [facility] revealed innumerable stable bilateral cavitary pulmonary nodules (up to 8 mm), some of which demonstrate low level metabolic activity.  There were post treatment changes in the pelvis without evidence of suspicious hypermetabolic activity. 12/03/2017:  Abdomen and pelvic CT revealed continued multiple pulmonary nodules in the lung bases concerning for metastatic disease.  There was mild wall thickening of rectosigmoid colon is noted.  There was a stable 1.8 x 1.6 cm mixed fat and soft tissue density noted in right lower quadrant which may represent fat necrosis.  There was a larger low density is noted involving uterine fundus.  12/03/2017:  Pelvic MRI was unremarkable.   04/15/2018:  Chest, abdomen, and pelvis CT revealed innumerable (> 100) cavitary nodules scattered in the lungs, moderately enlarging compared to the 11/08/2017 PET-CT, suspicious for metastatic disease.  One index node in the RLL measures 1.0 x 1.1 cm (previously 0.6 x 0.6 cm).  There were no new nodules.   There was an ill-defined wall thickening  in the rectosigmoid with surrounding stranding along fascia planes, probably sequela from prior radiation therapy.  There was multilevel lumbar impingement due to spondylosis and degenerative disc disease.  There was heterogeneous enhancement in the uterus (some possibly from prior radiation therapy). 04/23/2018:  PET scan revealed numerous scattered solid and cavitary nodules in the lungs stable increased in size compared to the prior PET-CT from 11/08/2017. Largest nodule was 1.1 cm in the LUL (SUV 1.9).  These demonstrated low-grade metabolic activity up to a maximum SUV of about 2.3, increased from 11/08/2017.    Infusion on 09/03/2018  Component Date Value Ref Range Status  . Sodium 09/03/2018 138  135 - 145 mmol/L Final  . Potassium 09/03/2018 3.8  3.5 - 5.1 mmol/L Final  . Chloride 09/03/2018 104  98 - 111 mmol/L Final  . CO2 09/03/2018 25  22 - 32 mmol/L Final  . Glucose, Bld 09/03/2018 125* 70 - 99 mg/dL Final  . BUN 09/03/2018 13  6 - 20 mg/dL Final  . Creatinine, Ser 09/03/2018 0.79  0.44 - 1.00 mg/dL Final  . Calcium 09/03/2018 9.0  8.9 - 10.3 mg/dL Final  . Total Protein 09/03/2018 7.2  6.5 - 8.1 g/dL Final  . Albumin 09/03/2018 3.7  3.5 - 5.0 g/dL Final  . AST 09/03/2018 30  15 - 41 U/L Final  . ALT 09/03/2018 34  0 - 44 U/L Final  . Alkaline Phosphatase 09/03/2018 143* 38 - 126 U/L Final  . Total Bilirubin 09/03/2018 0.4  0.3 - 1.2 mg/dL Final  . GFR calc non Af Amer 09/03/2018 >60  >60 mL/min Final  . GFR calc Af Amer 09/03/2018 >60  >60 mL/min Final   Comment: (NOTE) The eGFR has been calculated using the CKD EPI equation. This calculation has not been validated in all clinical situations. eGFR's persistently <60 mL/min signify possible Chronic Kidney Disease.   Georgiann Hahn gap 09/03/2018 9  5 - 15 Final   Performed at [facility], [address] [place], [place] [address]  . WBC 09/03/2018 3.1* 4.0 - 10.5 K/uL Final  . RBC  09/03/2018 3.05* 3.87 - 5.11 MIL/uL Final  . Hemoglobin 09/03/2018 10.1* 12.0 - 15.0 g/dL Final  . HCT 09/03/2018 31.8* 36.0 - 46.0 % Final  . MCV 09/03/2018 104.3* 80.0 - 100.0 fL Final  . MCH 09/03/2018 33.1  26.0 - 34.0 pg Final  . MCHC 09/03/2018 31.8  30.0 - 36.0 g/dL Final  . RDW 09/03/2018 14.8  11.5 - 15.5 % Final  . Platelets 09/03/2018 159  150 - 400 K/uL Final  . nRBC 09/03/2018 0.0  0.0 - 0.2 % Final  . Neutrophils Relative % 09/03/2018 58  % Final  . Neutro Abs 09/03/2018 1.8  1.7 - 7.7 K/uL Final  . Lymphocytes Relative 09/03/2018 32  % Final  . Lymphs Abs 09/03/2018 1.0  0.7 - 4.0 K/uL Final  . Monocytes Relative 09/03/2018 8  % Final  . Monocytes Absolute 09/03/2018 0.2  0.1 - 1.0 K/uL Final  . Eosinophils Relative 09/03/2018 2  % Final  . Eosinophils Absolute 09/03/2018 0.1  0.0 - 0.5 K/uL Final  . Basophils Relative 09/03/2018 0  % Final  . Basophils Absolute 09/03/2018 0.0  0.0 - 0.1 K/uL Final  . Immature Granulocytes 09/03/2018 0  % Final  . Abs Immature Granulocytes 09/03/2018 0.01  0.00 - 0.07 K/uL Final   Performed at [facility], [address], [place], [place] [address]  . Magnesium 09/03/2018  1.4* 1.7 - 2.4 mg/dL Final   Performed at [facility], [address] [place], [place] [address]    Assessment:  AYJAH SHOW is a 52 y.o. female with a history of stage IB1 cervical cancer s/p concurrent cisplatin and radiation (external beam and brachytherapy) from 11/2016 - 02/2017.    She was treated by Dr. Christene Slates at [facility] in [place], [place].  In 01/2017, she received cisplatin x 2 and carboplatin x 1 (01/29/2017) due to ARF and XRT. Radiation was followed by T & O on 02/01/2017 and T & N on 02/10/2017 and 02/20/2017.  Course was complicated by weight loss (80 pounds), nausea, vomiting, electrolyte wasting (potassium and magnesium), and multiple hospitalizations.  PET scan in 06/2017 revealed enlarging  pulmonary nodules but no evidence of abdominal disease per patient report.  She is s/p right ileocolectomy on 08/31/2017 for a small bowel obstruction.  She presented with abdominal pain.  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 PDL-1 testing reveals a CPS score of 15.  Chest, abdomen, and pelvis CT on 04/15/2018 revealed innumerable (> 100) cavitary nodules scattered in the lungs, moderately enlarging compared to the 11/08/2017 PET-CT, suspicious for metastatic disease. One index node in the RLL measures 1.0 x 1.1 cm.  There were no new nodules.  There was an ill-defined wall thickening in the rectosigmoid with surrounding stranding along fascia planes, probably sequela from prior radiation therapy.  There was multilevel lumbar impingement due to spondylosis and degenerative disc disease.  There was heterogeneous enhancement in the uterus (some possibly from prior radiation therapy).  PET scan on 04/23/2018 revealed numerous scattered solid and cavitary nodules in the lungs stable increased in size compared to the prior PET-CT from 11/08/2017. Largest nodule was 1.1 cm in the LUL (SUV 1.9).  These demonstrated low-grade metabolic activity up to a maximum SUV of about 2.3, increased from 11/08/2017.   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  She s/p 3 cycles of carboplatin and Taxol (06/04/2018 - 07/23/2018) with Margarette [place] support.    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  Testing was negative for prothrombin gene mutation, lupus anticoagulant panel, anticardiolipin antibodies, and beta-2 glycoprotein antibodies.  She was discharged on Xarelto.  She was admitted to [facility] from 12/03/2017 -  12/08/2017 with abdominal pain. She was not felt to have a uterine infarct.  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  The etiology was unclear.  She is hepatitis C positive.   Hepatitis C genotype is 2a/2c.  She has B12 deficiency.  B12 was 165 on 11/27/2017.  She began B12 injections on 11/29/2017.  B12 injections occur monthly at home.  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  She was diagnosed with colitis.  Abdomen and pelvic CT on 05/23/2018 revealed colonic wall thickening, consistent with colitis. Considerations include inflammatory or infectious causes. Ischemic causes would be less likely. She is s/p RIGHT hemicolectomy. She had a patent ileocolic anastomosis.  She received a 10 day course of ciprofloxacin and Flagyl.  She has chronic diarrhea s/p right hemicolectomy.  She is on colestipol, budesonide, amitriptyline, and Viberzi.  She has adrenal insufficiency.  She is on hydrocortisone 5 mg 3 times daily.  Symptomatically, she feels  better and stronger.  She has not had diarrhea in 1 week.  She denies any abdominal pain.  Exam is unremarkable.  Plan:   1. Labs today:  CBC with diff, CMP, Mg. 2. Cervical cancer: Cycle #4 carboplatin + paclitaxel with Neulasta support.   Discuss symptom management.  She has antiemetics and pain medications at home to use on a prn bases.  Interventions are adequate.    3. Transaminitis: Resolved. Alkaline phosphatase is 143. No abdominal pain. Follow up with gastroenterology Marius Ditch, MD).  4. Adrenal insufficiency Cortisol level low at 2.7 ug/mL. Patient on hydrocortisone. 5. Nausea, vomiting, and diarrhea:  Nausea well controlled.  No diarrhea in 1 week on current regimen. 6. Dehydration and electrolyte derangements: Patient well hydrated. Magnesium is 1.4 Magnesium sulfate 4 gm IV. Continue to monitor. 7. B12 deficiency Continue monthly B12 at home. 8.   RTC on 11/27 for labs (CBC with diff, BMP, Mg) +/-  Mg. 9.   RTC on 12/02 for labs (CBC with diff, BMP, Mg) +/- Mg. 10.   RTC 09/24/2018 for MD assess, labs (CBC with diff, CMP, Mg), and cycle # 5 carboplatin + Taxol +/- IV Mg.   Lequita Asal, MD  09/03/2018, 4:35 PM

## 2018-09-04 ENCOUNTER — Inpatient Hospital Stay (HOSPITAL_BASED_OUTPATIENT_CLINIC_OR_DEPARTMENT_OTHER): Payer: Medicaid Other | Admitting: Obstetrics and Gynecology

## 2018-09-04 ENCOUNTER — Inpatient Hospital Stay: Payer: Medicaid Other

## 2018-09-04 VITALS — BP 126/80 | HR 102 | Temp 97.8°F | Resp 18 | Wt 167.1 lb

## 2018-09-04 DIAGNOSIS — C7800 Secondary malignant neoplasm of unspecified lung: Secondary | ICD-10-CM

## 2018-09-04 DIAGNOSIS — C538 Malignant neoplasm of overlapping sites of cervix uteri: Secondary | ICD-10-CM

## 2018-09-04 DIAGNOSIS — Z5111 Encounter for antineoplastic chemotherapy: Secondary | ICD-10-CM | POA: Diagnosis not present

## 2018-09-04 LAB — VASOACTIVE INTESTINAL PEPTIDE (VIP): Vasoactive Intest Polypeptide: 48.5 pg/mL (ref 0.0–58.8)

## 2018-09-04 MED ORDER — PEGFILGRASTIM INJECTION 6 MG/0.6ML ~~LOC~~
6.0000 mg | PREFILLED_SYRINGE | Freq: Once | SUBCUTANEOUS | Status: AC
  Administered 2018-09-04: 6 mg via SUBCUTANEOUS
  Filled 2018-09-04: qty 0.6

## 2018-09-04 NOTE — Progress Notes (Signed)
Pt in for follow up with GYN clinic, denies any concerns or difficulty regarding GYN symptoms.  Pt saw Dr Mike Gip and had chemotherapy treatment yesterday.

## 2018-09-04 NOTE — Progress Notes (Signed)
Gynecologic Oncology Interval Visit   Referring Provider: Dr. Nolon Stalls  Chief Concern: Recurrent stage IB1 cervical cancer s/p chemoradiation with ileocolectomy for bowel obstruction  Subjective:  Joanna Hall is a 52 y.o. female diagnosed with recurrent stage IB1 cervical adenocarcinoma, seen in consultation for Dr. Mike Gip, who returns to clinic today for follow-up.  She was diagnosed with probable recurrent stage IB1 cervical adenocarcinoma status post aborted radical hysterectomy due to infected adnexa on 2/18 at [facility] followed by chemo-radiation with apparent complete response. She developed bowel obstruction and had resection of terminal ileum and cecum 11/18 at [facility]. Imaging showed enlarging lung mets which were biopsy proven metastatic cervical adenocarcinoma. She initiated carboplatin-taxol on 06/04/18 and has completed 4 cycles   Foundation One testing was positive for PD-L1.   CT A/P w/ contrast 07/27/18 for abdominal pain/nausea/diarrhea 1. 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 2. No acute or significant findings elsewhere within the abdomen or pelvis. No bowel obstruction. No abscess collection. No free intraperitoneal air. No evidence of acute solid organ abnormality. 3. Stable small nodules at the lung bases, some cavitary, compatible with previously described known metastatic disease. 4. Aortic Atherosclerosis (ICD10-I70.0).    Gynecologic Oncology History The patient was diagnosed with cervical adenocarcinoma in [place] in 09/2016.  She has had a long standing history of abnormal PAP smears.  She presented with an ovarian cyst.  Laparoscopic surgery was difficult secondary to scar tissue.  Had BSO and rupture of  cyst with purulent drainage into abdomen.  Had Stage IB1 (4 cm) endocervical adenocarcinoma. Radical hysterectomy aborted.   She was treated by Dr Christene Slates at [facility] in [place], [place]. Decision was made to pursue concurrent chemotherapy (weekly cisplatin) and radiation.  She received treatment from 11/2016 - 05/2017.  01/2017 cisplatin x 2 and Carboplatin x 1 (01/29/17) due to ARF and XRT. XRT followed by T & O on 02/01/17 and T & N 4.28/18 and 02/20/17 . Course was complicated by weight loss (80 pounds), nausea, vomiting, electrolyte wasting (potassium and magnesium).  She describes being "sick constantly" and requiring at least 20 hospitalizations.   She underwent follow-up chest CT then PET scan in 06/2017.  She states that "the radiation worked" and there was no disease in the abdomen.  She was noted to have lung nodules that were growing.  She was scheduled to have follow-up imaging in 10/2017.  She was admitted in [place] with a small bowel obstruction.  She was managed conservatively.  She was home for about a week then traveled to [place] for Thanksgiving holiday (she has family here).  She presented on 08/27/2017 with nausea, vomiting, and lower abdominal pain.  Symptoms did not respond to conservative measurement.    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  She underwent laparotomy and right ileocolectomy on 08/31/2017 at [facility].  There were numerous pulmonary nodules c/w metastatic disease.  Surgical findings revealed a thickened, matted, and scarred piece of distal small bowel close to the ileocecal valve.  Diet was slowly advanced.  She was discharged on 09/05/2017.  Abdominal and  pelvic CT without contrast on 09/11/2017 revealed  debris within anterior abdominal wall incision concerning for infection, versus packing material.   She is s/p ileocolectomy with expected postoperative changes and mild colonic ileus.  There were numerous pulmonary nodules highly concerning for metastatic disease and punctate nonobstructing nephrolithiasis. She was readmitted on 09/12/2017.  She describes the onset of lower abdominal pain on 09/09/2017.  Pain markedly increased in intensity on 09/11/2017.   Staples were removed and the wound packed.  She was started on doxycycline.  Abdomen and pelvic CT with contrast revealed postsurgical changes from ileocecectomy with primary ileocolic anastomosis without evidence of abscess or leak.  There was edema of small bowel loops at distal ileum potentially related to surgery.  There was gas within ventral midline surgical wound corresponding to wound infection versus packing material.  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  She denies any history of thrombosis.  She denies any family history of thrombosis.  She stopped smoking 10 years ago (1/4 ppd) and has a history of thoracotomy.  She denies any family history of rheumatologic problems/vasculitis/autoimmune disorders.  Thrombosis work up showed she is heterozygous for Factor V Leiden and taking Xarelto.  Patient had diarrhea despite Lomotil and Imodium with 15 watery BMs per day.  Dr. Hampton Abbot who did the bowel resection managing.  PET scan ordered to evaluate enlarging lung nodules with concern for recurrent cervical cancer. Scan not done yet due to insurance issues and she will have to get the study done in [place].   She was having chronic diarrhea postoperatively and has history of chronic hep C and was seen by surgery and GI for management.  On 12/03/2017 she presented to ER for abdominal pain and emesis.  Imaging revealed:  12/03/17- CT A/P: continued multiple pulmonary nodules inthelung bases  concerning for metastatic disease. There was mild wall thickening of rectosigmoid colon is noted.There was a stable 1.8 x 1.6 cm mixed fat and soft tissue densitynoted in right lower quadrant which may represent fat necrosis.There was a larger low density is noted involving uterine fundus.   12/03/2017:  Pelvic MRI was unremarkable.   04/15/2018:  Chest, abdomen, and pelvis CT revealed innumerable (> 100) cavitary nodules scattered in the lungs, moderately enlarging compared to the 11/08/2017 PET-CT, suspicious for metastatic disease.  One index node in the RLL measures 1.0 x 1.1 cm (previously 0.6 x 0.6 cm).  There were no new nodules.  There was an ill-defined wall thickening in the rectosigmoid with surrounding stranding along fascia planes, probably sequela from prior radiation therapy.  There was multilevel lumbar impingement due to spondylosis and degenerative disc disease.  There was heterogeneous enhancement in the uterus (some possibly from prior radiation therapy).  04/23/2018:  PET scan revealed numerous scattered solid and cavitary nodules in the lungs stable increased in size compared to the prior PET-CT from 11/08/2017. Largest nodule was 1.1 cm in the LUL (SUV 1.9).  These demonstrated low-grade metabolic activity up to a maximum SUV of about 2.3, increased from 11/08/2017.   On 04/24/2018 she underwent left total knee replacement with Dr. [name].  She is hepatitis C positive.  Hepatitis C genotype is 2a/2c.  She received B12 injections for history of B12 deficiency.   Case was discussed at tumor board on 04/25/2018 since this was to pursue CT-guided biopsy of left upper lobe pulmonary nodule which was performed on 05/06/18- Pathology: Metastatic adenocarcinoma morphologically consistent with cervical adenocarcinoma.  06/04/18 She initiated carboplatin-Taxol.     Problem List: Patient Active Problem List   Diagnosis Date Noted  . Adrenal insufficiency ([place]) 09/03/2018  . Chronic  diarrhea 06/25/2018  . Chronic anticoagulation 06/25/2018  . Vomiting   . Abdominal pain 06/10/2018  . Encounter for antineoplastic chemotherapy 06/04/2018  . 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 Lung metastasis ([place]) 05/15/2018  . Closed fracture of distal end of radius 05/04/2018  . History of total knee arthroplasty 04/24/2018  . Osteoarthritis of left knee 02/28/2018  . Condyloma acuminatum of anus s/p ablation 02/22/2018 02/22/2018  . Condyloma acuminatum of vagina s/p ablation 02/22/2018 02/22/2018  . Positive ANA (antinuclear antibody) 02/04/2018  . Aortic atherosclerosis ([place]) 01/15/2018  . Elevated MCV 01/15/2018  . Anemia 01/15/2018  . Genital warts 01/15/2018  . Vitamin D deficiency 01/15/2018  . Pernicious anemia   . Impingement syndrome of shoulder region 12/19/2017  . Multiple joint pain 12/19/2017  . Osteoarthritis of right knee 12/19/2017  . B12 deficiency 12/11/2017  . Lung nodules 12/11/2017  . History of Clostridium difficile infection 10/23/2017  . Factor V Leiden ([place]) 10/19/2017  . Goals of care, counseling/discussion 10/19/2017  . Cervical cancer, FIGO stage IB1 ([place]) 09/18/2017  . Chronic hepatitis C without hepatic coma ([place]) 09/18/2017  . Cytopenia 09/18/2017  . Diarrhea 09/18/2017  . Lower abdominal pain 09/18/2017  . 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 Malignant neoplasm of overlapping sites of cervix ([place]) 09/18/2017  . Renal insufficiency 09/18/2017  . Wound infection after surgery 09/12/2017  . Hypokalemia   . Hypomagnesemia   . Cervical arthritis 07/18/2017  . Dysuria 06/20/2017  . Metastatic cancer ([place]) 05/12/2017  . Essential hypertension 03/15/2017  . Anemia in other chronic diseases classified elsewhere 03/01/2017  . Chemotherapy-induced neutropenia ([place]) 01/29/2017  . 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[place]) 09/25/2016    Past Medical History: Past Medical History:  Diagnosis Date  . Abnormal cervical Papanicolaou smear 09/18/2017   . Anxiety   . Aortic atherosclerosis ([place])   . Arthritis    neck and knees  . 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 Blood transfusion without reported diagnosis   . Cervical cancer (HCC)    mets lung  . Chronic anal fissure   . Chronic diarrhea   . Dyspnea   . Erosive gastropathy 09/18/2017  . Factor V Leiden mutation ([place])   . Fecal incontinence   . Genital warts   . GERD (gastroesophageal reflux disease)   . Heart murmur   . Hemorrhoids   . 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 Hepatitis, chronic ([place]) 05/05/2017  . History of cancer chemotherapy    completed 06/2017  . History of Clostridium difficile infection    while undergoing chemo.  Negative test 10/2017  . Ileocolic anastomotic leak   . 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 Intestinal infection due to Clostridium difficile 09/18/2017  . Macrocytic anemia with vitamin B12 deficiency   . Nausea vomiting and diarrhea   . Pancolitis ([place]) 07/27/2018  . Perianal condylomata   . Pneumonia    History of  . Pulmonary nodules   . Rectal bleeding   . Small bowel obstruction ([place]) 08/2017  . Stiff neck    limited right turn  . Vitamin D deficiency     Past Surgical History: Past Surgical History:  Procedure Laterality Date  . CHOLECYSTECTOMY    .  COLON SURGERY  08/2017   resection  . COLONOSCOPY WITH PROPOFOL N/A 12/20/2017   Procedure: COLONOSCOPY WITH PROPOFOL;  Surgeon: [name], MD;  Location: [facility] ENDOSCOPY;  Service: Gastroenterology;  Laterality: N/A;  . COLONOSCOPY WITH PROPOFOL N/A 07/30/2018   Procedure: COLONOSCOPY WITH PROPOFOL;  Surgeon: [name], MD;  Location[facility] ENDOSCOPY;  Service: Gastroenterology;  Laterality: N/A;  . DIAGNOSTIC LAPAROSCOPY    . ESOPHAGOGASTRODUODENOSCOPY (EGD) WITH PROPOFOL N/A 12/20/2017   Procedure: ESOPHAGOGASTRODUODENOSCOPY (EGD) WITH PROPOFOL;  Surgeon: [name], MD;  Location: [place];  Service: Gastroenterology;  Laterality: N/A;  . ESOPHAGOGASTRODUODENOSCOPY (EGD) WITH PROPOFOL  07/30/2018   Procedure: ESOPHAGOGASTRODUODENOSCOPY (EGD) WITH PROPOFOL;  Surgeon: [name], MD;  Location: [facility] ENDOSCOPY;  Service: Gastroenterology;;  . LAPAROTOMY N/A 08/31/2017   Procedure: EXPLORATORY LAPAROTOMY for SBO, ileocolectomy, removal of piece of uterine wall;  Surgeon: [name], MD;  Location: [facility] ORS;  Service: General;  Laterality: N/A;  . LASER ABLATION CONDOLAMATA N/A 02/22/2018   Procedure: LASER ABLATION/REMOVAL OF FUXNATFTDDU AROUND ANUS AND VAGINA;  Surgeon: Michael Boston, MD;  Location: [place];  Service: General;  Laterality: N/A;  . OOPHORECTOMY    . PORTA CATH INSERTION N/A 05/13/2018   Procedure: PORTA CATH INSERTION;  Surgeon: Katha Cabal, MD;  Location: [place] CV LAB;  Service: Cardiovascular;  Laterality: N/A;  . SMALL INTESTINE SURGERY    . TANDEM RING INSERTION     x3  . THORACOTOMY    . TOTAL KNEE ARTHROPLASTY Left 04/24/2018   Procedure: TOTAL KNEE ARTHROPLASTY;  Surgeon: Lovell Sheehan, MD;  Location: [facility] ORS;  Service: Orthopedics;  Laterality: Left;     Family History: Family History  Problem Relation Age of Onset  . Hypertension Father   . Diabetes Father   . Alcohol abuse Daughter   . Hypertension Maternal Grandmother   . Diabetes Maternal Grandmother   . Diabetes Paternal Grandmother   . Hypertension Paternal Grandmother     Social History: Social History   Socioeconomic History  . Marital status: Legally Separated    Spouse name: Not on file  . Number of children: Not on file  . Years of education: Not on file  . Highest education level: Not on file  Occupational History  . Not on file  Social Needs  . Financial resource strain: Not hard at all  . Food insecurity:    Worry: Never true    Inability: Never true  . Transportation needs:    Medical: No    Non-medical: No   Tobacco Use  . Smoking status: Former Smoker    Last attempt to quit: 10/16/2006    Years since quitting: 11.8  . Smokeless tobacco: Never Used  Substance and Sexual Activity  . Alcohol use: Yes    Frequency: Never    Comment: seldom  . Drug use: Yes    Types: Marijuana  . Sexual activity: Not Currently    Birth control/protection: Post-menopausal    Comment: Not Asked  Lifestyle  . Physical activity:    Days per week: Patient refused    Minutes per session: Patient refused  . Stress: Only a little  Relationships  . Social connections:    Talks on phone: Patient refused    Gets together: Patient refused    Attends religious service: Patient refused    Active member of club or organization: Patient refused    Attends meetings of clubs or organizations: Patient refused  Relationship status: Patient refused  . Intimate partner violence:    Fear of current or ex partner: No    Emotionally abused: No    Physically abused: No    Forced sexual activity: No  Other Topics Concern  . Not on file  Social History Narrative  . Not on file    Allergies: Allergies  Allergen Reactions  . Ketamine Anxiety and Other (See Comments)    Syncope episode/confusion     Current Medications: Current Outpatient Medications  Medication Sig Dispense Refill  . amitriptyline (ELAVIL) 50 MG tablet Take 1 tablet (50 mg total) by mouth at bedtime. 30 tablet 2  . budesonide (ENTOCORT EC) 3 MG 24 hr capsule Take 3 capsules (9 mg total) by mouth daily. 90 capsule 2  . Calcium Carb-Cholecalciferol (CALCIUM 500 +D) 500-400 MG-UNIT TABS Take 2 tablets by mouth daily.    . colestipol (COLESTID) 1 g tablet Take 2 tablets (2 g total) by mouth 3 (three) times daily with meals. 180 tablet 0  . cyanocobalamin (,VITAMIN B-12,) 1000 MCG/ML injection Inject 1ML every week x 4 weeks, then 1ML every other week x 2 months, then monthly x 3 months (Patient taking differently: Inject 1,000 mcg into the muscle every  30 (thirty) days. Inject 1ML every week x 4 weeks, then 1ML every other week x 2 months, then monthly x 3 months) 10 mL 1  . Eluxadoline (VIBERZI) 100 MG TABS Take 1 tablet (100 mg total) by mouth 2 (two) times daily with a meal. 60 tablet 0  . hydrocortisone (CORTEF) 5 MG tablet Take 1 tablet (5 mg total) by mouth 3 (three) times daily. 90 tablet 0  . Multiple Vitamins-Minerals (MULTIVITAMIN WITH MINERALS) tablet Take 1 tablet by mouth daily. 30 tablet 0  . OLANZapine zydis (ZYPREXA) 5 MG disintegrating tablet Take 1 tablet (5 mg total) by mouth at bedtime. 30 tablet 0  . ondansetron (ZOFRAN) 8 MG tablet Take 1 tablet (8 mg total) by mouth every 8 (eight) hours as needed for nausea or vomiting. 30 tablet 3  . pantoprazole (PROTONIX) 40 MG tablet Take 1 tablet (40 mg total) by mouth daily. 30 tablet 0  . promethazine (PHENERGAN) 25 MG tablet Take 1 tablet (25 mg total) by mouth every 6 (six) hours as needed for nausea or vomiting. 30 tablet 1  . rivaroxaban (XARELTO) 20 MG TABS tablet Take 1 tablet (20 mg total) by mouth daily with supper. 30 tablet 3  . Syringe/Needle, Disp, (SYRINGE 3CC/22GX1") 22G X 1" 3 ML MISC For use with Vitamin B12 injections 50 each 0  . traZODone (DESYREL) 50 MG tablet Take 0.5-1 tablets (25-50 mg total) by mouth at bedtime as needed for sleep. 30 tablet 0  . VENTOLIN HFA 108 (90 Base) MCG/ACT inhaler Inhale 1-2 puffs into the lungs every 4 (four) hours as needed for shortness of breath.   0   No current facility-administered medications for this visit.    Facility-Administered Medications Ordered in Other Visits  Medication Dose Route Frequency Provider Last Rate Last Dose  . heparin lock flush 100 unit/mL  500 Units Intravenous Once Lequita Asal, MD       Review of Systems General: fatigue & weakness Skin: no complaints Eyes: no complaints HEENT: no complaints Breasts: no complaints Pulmonary: shortness of breath (unchanged) Cardiac: no  complaints Gastrointestinal: abdominal pain, nausea, diarrhea; chronic and improved Genitourinary/Sexual: no complaints Ob/Gyn: no complaints Musculoskeletal: chronic back pain; s/p left knee replacement Hematology: no complaints; on  xarelto Neurologic/Psych: depression   Objective:  Physical Examination:  BP 126/80 (BP Location: Left Arm, Patient Position: Sitting)   Pulse (!) 102   Temp 97.8 F (36.6 C) (Tympanic)   Resp 18   Wt 167 lb 2 oz (75.8 kg)   BMI 26.18 kg/m    ECOG Performance Status: 1 - Symptomatic but completely ambulatory  General appearance: alert, cooperative and appears stated age HEENT:neck supple with midline trachea and thyroid without masses Lymph node survey: non-palpable, axillary, inguinal, supraclavicular Chest: IV port incisions in right chest and neck- well healed Cardiovascular: regular rate and rhythm, no murmurs or gallops Respiratory: normal air entry, lungs clear to auscultation and no rales, rhonchi or wheezing Abdomen: no hernias. Incision now well healed. Nontender and not distended  Back: inspection of back is normal. Non-tender to palpation Extremities: extremities normal, atraumatic, no cyanosis or edema Skin exam - normal coloration and turgor, no rashes, no suspicious skin lesions noted. Neurological exam reveals alert, oriented, normal speech, no focal findings or movement disorder noted.  Pelvic: exam chaperoned by nurse; Vulva normal, Vagina now well healed and somewhat shortened. Narrow speculum tolerated well.  Atrophic, mucosa pale, no lesions. Cervix/Uterus: surgically absent. Bimanual/RV:normal  No masses or nodularity.     Assessment:  Joanna Hall is a 52 y.o. female diagnosed with recurrent stage IB1 cervical adenocarcinoma (PDL1 positive) s/p aborted radical hysterectomy due to infected adnexa 2/18 at [facility].  Then had chemoradiation at [facility] with apparent complete response.  Developed bowel obstruction and had resection of  terminal ileum and cecum 11/18 at [facility].  Now with new lung metastases 7/19. Currently on carboplatin and Taxol with evidence of stable disease.   Vaginal vault necrosis well healed now.  Medical co-morbidities complicating care:untreated Hepatitis C .  Plan:   Problem List Items Addressed This Visit      Genitourinary   Malignant neoplasm of overlapping sites of cervix [facility]) - Primary      Continue carboplatin/taxol chemotherapy with Dr Mike Gip. Bevacizumab not given as patient is on Xarelto for factor V Leiden and renal infarct.     PDL1 positive disease; she would be a candidate for Keytruda in the future if she progresses.    We can see her back in 3-4 months for follow up or sooner if needed.  Beckey Rutter, DNP, AGNP-C [place] at [facility] [phone] (work cell) [phone] (office)  I personally had a face to face interaction and evaluated the patient jointly with the NP, Ms. Beckey Rutter.  I have reviewed her history and available records and have performed the key portions of the physical exam including inguinal lymph node survey, abdominal exam, pelvic exam with my findings confirming those documented above by the APP.  I have discussed the case with the APP and the patient.  I agree with the above documentation, assessment and plan which was fully formulated by me.  Counseling was completed by me.   I personally saw the patient and performed a substantive portion of this encounter in conjunction with the listed APP as documented above.  A total of 20 minutes were spent with the patient/family today; >50% was spent in education, counseling and coordination of care for cervical cancer.   Niranjan Rufener Gaetana Michaelis, MD    CC: Dr. Nolon Stalls. [facility]  Dr. Fleet Contras at [facility] [phone])

## 2018-09-05 ENCOUNTER — Encounter: Payer: Self-pay | Admitting: Hematology and Oncology

## 2018-09-07 ENCOUNTER — Emergency Department: Payer: Medicaid Other

## 2018-09-07 ENCOUNTER — Other Ambulatory Visit: Payer: Self-pay

## 2018-09-07 ENCOUNTER — Emergency Department
Admission: EM | Admit: 2018-09-07 | Discharge: 2018-09-07 | Disposition: A | Discharge status: Home / Self Care | Payer: Medicaid Other | Attending: Student in an Organized Health Care Education/Training Program | Admitting: Student in an Organized Health Care Education/Training Program

## 2018-09-07 DIAGNOSIS — Z79899 Other long term (current) drug therapy: Secondary | ICD-10-CM | POA: Insufficient documentation

## 2018-09-07 DIAGNOSIS — R112 Nausea with vomiting, unspecified: Secondary | ICD-10-CM | POA: Diagnosis present

## 2018-09-07 DIAGNOSIS — Z87891 Personal history of nicotine dependence: Secondary | ICD-10-CM | POA: Diagnosis not present

## 2018-09-07 DIAGNOSIS — K529 Noninfective gastroenteritis and colitis, unspecified: Secondary | ICD-10-CM | POA: Diagnosis not present

## 2018-09-07 DIAGNOSIS — Z7901 Long term (current) use of anticoagulants: Secondary | ICD-10-CM | POA: Diagnosis not present

## 2018-09-07 DIAGNOSIS — E860 Dehydration: Secondary | ICD-10-CM | POA: Insufficient documentation

## 2018-09-07 DIAGNOSIS — R1084 Generalized abdominal pain: Secondary | ICD-10-CM | POA: Insufficient documentation

## 2018-09-07 LAB — CG4 I-STAT (LACTIC ACID)
LACTIC ACID, VENOUS: 2.43 mmol/L — AB (ref 0.5–1.9)
Lactic Acid, Venous: 1.43 mmol/L (ref 0.5–1.9)

## 2018-09-07 LAB — URINALYSIS, COMPLETE (UACMP) WITH MICROSCOPIC
BILIRUBIN URINE: NEGATIVE
Bacteria, UA: NONE SEEN
GLUCOSE, UA: NEGATIVE mg/dL
KETONES UR: 5 mg/dL — AB
LEUKOCYTES UA: NEGATIVE
NITRITE: NEGATIVE
PH: 6 (ref 5.0–8.0)
Protein, ur: NEGATIVE mg/dL
SQUAMOUS EPITHELIAL / LPF: NONE SEEN (ref 0–5)
Specific Gravity, Urine: 1.023 (ref 1.005–1.030)

## 2018-09-07 LAB — GASTROINTESTINAL PANEL BY PCR, STOOL (REPLACES STOOL CULTURE)

## 2018-09-07 LAB — CBC
HCT: 38 % (ref 36.0–46.0)
Hemoglobin: 12.7 g/dL (ref 12.0–15.0)
MCH: 34.4 pg — ABNORMAL HIGH (ref 26.0–34.0)
MCHC: 33.4 g/dL (ref 30.0–36.0)
MCV: 103 fL — AB (ref 80.0–100.0)
NRBC: 0 % (ref 0.0–0.2)
Platelets: 116 10*3/uL — ABNORMAL LOW (ref 150–400)
RBC: 3.69 MIL/uL — AB (ref 3.87–5.11)
RDW: 13.5 % (ref 11.5–15.5)
WBC: 8.4 10*3/uL (ref 4.0–10.5)

## 2018-09-07 LAB — COMPREHENSIVE METABOLIC PANEL
ALK PHOS: 231 U/L — AB (ref 38–126)
ALT: 113 U/L — ABNORMAL HIGH (ref 0–44)
ANION GAP: 13 (ref 5–15)
AST: 160 U/L — ABNORMAL HIGH (ref 15–41)
Albumin: 4.1 g/dL (ref 3.5–5.0)
BUN: 17 mg/dL (ref 6–20)
CALCIUM: 9.7 mg/dL (ref 8.9–10.3)
CHLORIDE: 97 mmol/L — AB (ref 98–111)
CO2: 23 mmol/L (ref 22–32)
Creatinine, Ser: 0.66 mg/dL (ref 0.44–1.00)
Glucose, Bld: 106 mg/dL — ABNORMAL HIGH (ref 70–99)
Potassium: 3.8 mmol/L (ref 3.5–5.1)
SODIUM: 133 mmol/L — AB (ref 135–145)
Total Bilirubin: 2.2 mg/dL — ABNORMAL HIGH (ref 0.3–1.2)
Total Protein: 8.1 g/dL (ref 6.5–8.1)

## 2018-09-07 LAB — C DIFFICILE QUICK SCREEN W PCR REFLEX
C Diff antigen: NEGATIVE
C Diff interpretation: NOT DETECTED
C Diff toxin: NEGATIVE

## 2018-09-07 LAB — LIPASE, BLOOD: LIPASE: 34 U/L (ref 11–51)

## 2018-09-07 LAB — MAGNESIUM: Magnesium: 1.7 mg/dL (ref 1.7–2.4)

## 2018-09-07 MED ORDER — LOPERAMIDE HCL 2 MG PO CAPS
4.0000 mg | ORAL_CAPSULE | Freq: Once | ORAL | Status: AC
  Administered 2018-09-07: 4 mg via ORAL
  Filled 2018-09-07: qty 2

## 2018-09-07 MED ORDER — SODIUM CHLORIDE 0.9 % IV BOLUS
1000.0000 mL | Freq: Once | INTRAVENOUS | Status: AC
  Administered 2018-09-07: 1000 mL via INTRAVENOUS

## 2018-09-07 MED ORDER — MORPHINE SULFATE (PF) 4 MG/ML IV SOLN
4.0000 mg | INTRAVENOUS | Status: DC | PRN
  Administered 2018-09-07: 4 mg via INTRAVENOUS
  Filled 2018-09-07 (×2): qty 1

## 2018-09-07 MED ORDER — PROMETHAZINE HCL 25 MG/ML IJ SOLN
12.5000 mg | Freq: Four times a day (QID) | INTRAMUSCULAR | Status: DC | PRN
  Administered 2018-09-07: 12.5 mg via INTRAVENOUS
  Filled 2018-09-07: qty 1

## 2018-09-07 MED ORDER — IOPAMIDOL (ISOVUE-370) INJECTION 76%
100.0000 mL | Freq: Once | INTRAVENOUS | Status: AC | PRN
  Administered 2018-09-07: 100 mL via INTRAVENOUS

## 2018-09-07 MED ORDER — HEPARIN SOD (PORK) LOCK FLUSH 100 UNIT/ML IV SOLN
500.0000 [IU] | Freq: Once | INTRAVENOUS | Status: AC
  Administered 2018-09-07: 500 [IU] via INTRAVENOUS
  Filled 2018-09-07: qty 5

## 2018-09-07 NOTE — ED Notes (Addendum)
Pt resting with fluids running. Taken to CT.

## 2018-09-07 NOTE — ED Notes (Signed)
Pt resting with lights off. Warm blankets given. NAD.

## 2018-09-07 NOTE — ED Provider Notes (Signed)
[facility] Emergency Department Provider Note    None    (approximate)  I have reviewed the triage vital signs and the nursing notes.   HISTORY  Chief Complaint Emesis and Diarrhea    HPI Joanna Hall is a 52 y.o. female presents the ER for evaluation of nausea vomiting diarrhea for the past 3 days.  Also having crampy diffuse abdominal pain.  Patient states that she been having low-grade temperature to 100 degrees at home.  She is currently undergoing chemotherapy for metastatic lung carcinoma.  States she does deal with some chronic diarrhea she is had partial colectomy.  Is currently on 5 antidiarrheal medications.  Feels very weak and dehydrated.  Denies any dysuria or flank pain.  Denies any blood in her stool.    Past Medical History:  Diagnosis Date  . Abnormal cervical Papanicolaou smear 09/18/2017  . Anxiety   . Aortic atherosclerosis ([place])   . Arthritis    neck and knees  . 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 Blood transfusion without reported diagnosis   . Cervical cancer (HCC)    mets lung  . Chronic anal fissure   . Chronic diarrhea   . Dyspnea   . Erosive gastropathy 09/18/2017  . Factor V Leiden mutation ([place])   . Fecal incontinence   . Genital warts   . GERD (gastroesophageal reflux disease)   . Heart murmur   . Hemorrhoids   . 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 Hepatitis, chronic ([place]) 05/05/2017  . History of cancer chemotherapy    completed 06/2017  . History of Clostridium difficile infection    while undergoing chemo.  Negative test 10/2017  . Ileocolic anastomotic leak   . 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 Intestinal infection due to Clostridium difficile 09/18/2017  . Macrocytic anemia with vitamin B12 deficiency   . Nausea vomiting and diarrhea   . Pancolitis ([place]) 07/27/2018  . Perianal condylomata   . Pneumonia    History of  .  Pulmonary nodules   . Rectal bleeding   . Small bowel obstruction ([place]) 08/2017  . Stiff neck    limited right turn  . Vitamin D deficiency    Family History  Problem Relation Age of Onset  . Hypertension Father   . Diabetes Father   . Alcohol abuse Daughter   . Hypertension Maternal Grandmother   . Diabetes Maternal Grandmother   . Diabetes Paternal Grandmother   . Hypertension Paternal Grandmother    Past Surgical History:  Procedure Laterality Date  . CHOLECYSTECTOMY    . COLON SURGERY  08/2017   resection  . COLONOSCOPY WITH PROPOFOL N/A 12/20/2017   Procedure: COLONOSCOPY WITH PROPOFOL;  Surgeon: [name], MD;  Location[facility] ENDOSCOPY;  Service: Gastroenterology;  Laterality: N/A;  . COLONOSCOPY WITH PROPOFOL N/A 07/30/2018   Procedure: COLONOSCOPY WITH PROPOFOL;  Surgeon: [name], MD;  Location: [facility] ENDOSCOPY;  Service: Gastroenterology;  Laterality: N/A;  . DIAGNOSTIC LAPAROSCOPY    . ESOPHAGOGASTRODUODENOSCOPY (EGD) WITH PROPOFOL N/A 12/20/2017   Procedure: ESOPHAGOGASTRODUODENOSCOPY (EGD) WITH PROPOFOL;  Surgeon: [name], MD;  Location: [place];  Service: Gastroenterology;  Laterality: N/A;  . ESOPHAGOGASTRODUODENOSCOPY (EGD) WITH PROPOFOL  07/30/2018   Procedure: ESOPHAGOGASTRODUODENOSCOPY (EGD) WITH PROPOFOL;  Surgeon: [name], MD;  Location: [facility] ENDOSCOPY;  Service: Gastroenterology;;  .  LAPAROTOMY N/A 08/31/2017   Procedure: EXPLORATORY LAPAROTOMY for SBO, ileocolectomy, removal of piece of uterine wall;  Surgeon: [name], MD;  Location: [facility] ORS;  Service: General;  Laterality: N/A;  . LASER ABLATION CONDOLAMATA N/A 02/22/2018   Procedure: LASER ABLATION/REMOVAL OF [id number] AROUND ANUS AND VAGINA;  Surgeon: Michael Boston, MD;  Location: [place];  Service: General;  Laterality: N/A;  . OOPHORECTOMY    . PORTA CATH INSERTION N/A 05/13/2018   Procedure: PORTA CATH INSERTION;  Surgeon:  Katha Cabal, MD;  Location: [place] CV LAB;  Service: Cardiovascular;  Laterality: N/A;  . SMALL INTESTINE SURGERY    . TANDEM RING INSERTION     x3  . THORACOTOMY    . TOTAL KNEE ARTHROPLASTY Left 04/24/2018   Procedure: TOTAL KNEE ARTHROPLASTY;  Surgeon: Lovell Sheehan, MD;  Location: [facility] ORS;  Service: Orthopedics;  Laterality: Left;   Patient Active Problem List   Diagnosis Date Noted  . Adrenal insufficiency ([place]) 09/03/2018  . Chronic diarrhea 06/25/2018  . Chronic anticoagulation 06/25/2018  . Vomiting   . Abdominal pain 06/10/2018  . Encounter for antineoplastic chemotherapy 06/04/2018  . 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 Lung metastasis ([place]) 05/15/2018  . Closed fracture of distal end of radius 05/04/2018  . History of total knee arthroplasty 04/24/2018  . Osteoarthritis of left knee 02/28/2018  . Condyloma acuminatum of anus s/p ablation 02/22/2018 02/22/2018  . Condyloma acuminatum of vagina s/p ablation 02/22/2018 02/22/2018  . Positive ANA (antinuclear antibody) 02/04/2018  . Aortic atherosclerosis ([place]) 01/15/2018  . Elevated MCV 01/15/2018  . Anemia 01/15/2018  . Genital warts 01/15/2018  . Vitamin D deficiency 01/15/2018  . Pernicious anemia   . Impingement syndrome of shoulder region 12/19/2017  . Multiple joint pain 12/19/2017  . Osteoarthritis of right knee 12/19/2017  . B12 deficiency 12/11/2017  . Lung nodules 12/11/2017  . History of Clostridium difficile infection 10/23/2017  . Factor V Leiden ([place]) 10/19/2017  . Goals of care, counseling/discussion 10/19/2017  . Cervical cancer, FIGO stage IB1 ([place]) 09/18/2017  . Chronic hepatitis C without hepatic coma ([place]) 09/18/2017  . Cytopenia 09/18/2017  . Diarrhea 09/18/2017  . Lower abdominal pain 09/18/2017  . 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 Malignant neoplasm of overlapping sites of cervix ( [place]) 09/18/2017  . Renal insufficiency 09/18/2017  . Wound infection after surgery  09/12/2017  . Hypokalemia   . Hypomagnesemia   . Cervical arthritis 07/18/2017  . Dysuria 06/20/2017  . Metastatic cancer ([place]) 05/12/2017  . Essential hypertension 03/15/2017  . Anemia in other chronic diseases classified elsewhere 03/01/2017  . Chemotherapy-induced neutropenia ([place]) 01/29/2017  . 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 Authorizing Provider  amitriptyline (ELAVIL) 50 MG tablet Take 1 tablet (50 mg total) by mouth at bedtime. 08/19/18 09/18/18  [name], MD  Calcium Carb-Cholecalciferol (CALCIUM 500 +D) 500-400 MG-UNIT TABS Take 2 tablets by mouth daily.    [provider]  colestipol (COLESTID) 1 g tablet Take 2 tablets (2 g total) by mouth 3 (three) times daily with meals. 08/01/18   Loletha Grayer, MD  cyanocobalamin (,VITAMIN B-12,) 1000 MCG/ML injection Inject 1ML every week x 4 weeks, then 1ML every other week x 2 months, then monthly x 3 months Patient taking differently: Inject 1,000 mcg into the muscle every 30 (thirty) days. Inject 1ML every week x 4 weeks, then 1ML every  other week x 2 months, then monthly x 3 months 11/29/17   Vanga, Tally Due, MD  Eluxadoline (VIBERZI) 100 MG TABS Take 1 tablet (100 mg total) by mouth 2 (two) times daily with a meal. 08/16/18   Verlon Au, NP  hydrocortisone (CORTEF) 5 MG tablet Take 1 tablet (5 mg total) by mouth 3 (three) times daily. 08/28/18 09/27/18  [name], MD  Multiple Vitamins-Minerals (MULTIVITAMIN WITH MINERALS) tablet Take 1 tablet by mouth daily. 08/01/18 08/01/19  Loletha Grayer, MD  OLANZapine zydis (ZYPREXA) 5 MG disintegrating tablet Take 1 tablet (5 mg total) by mouth at bedtime. 08/01/18   Loletha Grayer, MD  ondansetron (ZOFRAN) 8 MG tablet Take 1 tablet (8 mg total) by mouth every 8 (eight) hours as needed for nausea or vomiting. 08/23/18   Karen Kitchens, NP  pantoprazole  (PROTONIX) 40 MG tablet Take 1 tablet (40 mg total) by mouth daily. 08/01/18   Loletha Grayer, MD  promethazine (PHENERGAN) 25 MG tablet Take 1 tablet (25 mg total) by mouth every 6 (six) hours as needed for nausea or vomiting. 08/23/18   Karen Kitchens, NP  rivaroxaban (XARELTO) 20 MG TABS tablet Take 1 tablet (20 mg total) by mouth daily with supper. 08/27/18   Karen Kitchens, NP  Syringe/Needle, Disp, (SYRINGE 3CC/22GX1") 22G X 1" 3 ML MISC For use with Vitamin B12 injections 04/12/18   Vanga, Tally Due, MD  traZODone (DESYREL) 50 MG tablet Take 0.5-1 tablets (25-50 mg total) by mouth at bedtime as needed for sleep. 08/13/18   Karen Kitchens, NP  VENTOLIN HFA 108 (90 Base) MCG/ACT inhaler Inhale 1-2 puffs into the lungs every 4 (four) hours as needed for shortness of breath.     [provider]    Allergies Ketamine    Social History Social History   Tobacco Use  . Smoking status: Former Smoker    Last attempt to quit: 10/16/2006    Years since quitting: 11.9  . Smokeless tobacco: Never Used  Substance Use Topics  . Alcohol use: Yes    Frequency: Never    Comment: seldom  . Drug use: Yes    Types: Marijuana    Review of Systems Patient denies headaches, rhinorrhea, blurry vision, numbness, shortness of breath, chest pain, edema, cough, abdominal pain, nausea, vomiting, diarrhea, dysuria, fevers, rashes or hallucinations unless otherwise stated above in HPI. ____________________________________________   PHYSICAL EXAM:  VITAL SIGNS: Vitals:   09/07/18 1330 09/07/18 1500  BP: 115/84 130/78  Pulse: 94 83  Resp: 13 12  Temp:    SpO2: 100% 100%    Constitutional: Alert and oriented. Frail and weak appearing Eyes: Conjunctivae are normal.  Head: Atraumatic. Nose: No congestion/rhinnorhea. Mouth/Throat: Mucous membranes are moist.   Neck: No stridor. Painless ROM.  Cardiovascular: tachycardic, regular rhythm. Grossly normal heart sounds.  Good peripheral  circulation. Respiratory: Normal respiratory effort.  No retractions. Lungs CTAB. Gastrointestinal: Soft and nontender. No distention. No abdominal bruits. No CVA tenderness. Genitourinary: deferred Musculoskeletal: No lower extremity tenderness nor edema.  No joint effusions. Neurologic:  Normal speech and language. No gross focal neurologic deficits are appreciated. No facial droop Skin:  Skin is warm, dry and intact. No rash noted. Psychiatric: Mood and affect are anxiousl. Speech and behavior are normal.  ____________________________________________   LABS (all labs ordered are listed, but only abnormal results are displayed)  Results for orders placed or performed during the hospital encounter of 09/07/18 (from the past 24 hour(s))  Lipase, blood     Status: None   Collection Time: 09/07/18  9:46 AM  Result Value Ref Range   Lipase 34 11 - 51 U/L  Comprehensive metabolic panel     Status: Abnormal   Collection Time: 09/07/18  9:46 AM  Result Value Ref Range   Sodium 133 (L) 135 - 145 mmol/L   Potassium 3.8 3.5 - 5.1 mmol/L   Chloride 97 (L) 98 - 111 mmol/L   CO2 23 22 - 32 mmol/L   Glucose, Bld 106 (H) 70 - 99 mg/dL   BUN 17 6 - 20 mg/dL   Creatinine, Ser 0.66 0.44 - 1.00 mg/dL   Calcium 9.7 8.9 - 10.3 mg/dL   Total Protein 8.1 6.5 - 8.1 g/dL   Albumin 4.1 3.5 - 5.0 g/dL   AST 160 (H) 15 - 41 U/L   ALT 113 (H) 0 - 44 U/L   Alkaline Phosphatase 231 (H) 38 - 126 U/L   Total Bilirubin 2.2 (H) 0.3 - 1.2 mg/dL   GFR calc non Af Amer >60 >60 mL/min   GFR calc Af Amer >60 >60 mL/min   Anion gap 13 5 - 15  CBC     Status: Abnormal   Collection Time: 09/07/18  9:46 AM  Result Value Ref Range   WBC 8.4 4.0 - 10.5 K/uL   RBC 3.69 (L) 3.87 - 5.11 MIL/uL   Hemoglobin 12.7 12.0 - 15.0 g/dL   HCT 38.0 36.0 - 46.0 %   MCV 103.0 (H) 80.0 - 100.0 fL   MCH 34.4 (H) 26.0 - 34.0 pg   MCHC 33.4 30.0 - 36.0 g/dL   RDW 13.5 11.5 - 15.5 %   Platelets 116 (L) 150 - 400 K/uL   nRBC 0.0  0.0 - 0.2 %  Magnesium     Status: None   Collection Time: 09/07/18  9:46 AM  Result Value Ref Range   Magnesium 1.7 1.7 - 2.4 mg/dL  CG4 I-STAT (Lactic acid)     Status: Abnormal   Collection Time: 09/07/18  9:49 AM  Result Value Ref Range   Lactic Acid, Venous 2.43 (HH) 0.5 - 1.9 mmol/L   Comment NOTIFIED PHYSICIAN   Urinalysis, Complete w Microscopic     Status: Abnormal   Collection Time: 09/07/18 12:19 PM  Result Value Ref Range   Color, Urine AMBER (A) YELLOW   APPearance CLEAR (A) CLEAR   Specific Gravity, Urine 1.023 1.005 - 1.030   pH 6.0 5.0 - 8.0   Glucose, UA NEGATIVE NEGATIVE mg/dL   Hgb urine dipstick MODERATE (A) NEGATIVE   Bilirubin Urine NEGATIVE NEGATIVE   Ketones, ur 5 (A) NEGATIVE mg/dL   Protein, ur NEGATIVE NEGATIVE mg/dL   Nitrite NEGATIVE NEGATIVE   Leukocytes, UA NEGATIVE NEGATIVE   RBC / HPF 21-50 0 - 5 RBC/hpf   WBC, UA 6-10 0 - 5 WBC/hpf   Bacteria, UA NONE SEEN NONE SEEN   Squamous Epithelial / LPF NONE SEEN 0 - 5   Mucus PRESENT   C difficile quick scan w PCR reflex     Status: None   Collection Time: 09/07/18 12:19 PM  Result Value Ref Range   C Diff antigen NEGATIVE NEGATIVE   C Diff toxin NEGATIVE NEGATIVE   C Diff interpretation No C. difficile detected.   Gastrointestinal Panel by PCR , Stool     Status: None   Collection Time: 09/07/18 12:19 PM  Result Value Ref Range   Campylobacter species  NOT DETECTED NOT DETECTED   Plesimonas shigelloides NOT DETECTED NOT DETECTED   Salmonella species NOT DETECTED NOT DETECTED   Yersinia enterocolitica NOT DETECTED NOT DETECTED   Vibrio species NOT DETECTED NOT DETECTED   Vibrio cholerae NOT DETECTED NOT DETECTED   Enteroaggregative E coli (EAEC) NOT DETECTED NOT DETECTED   Enteropathogenic E coli (EPEC) NOT DETECTED NOT DETECTED   Enterotoxigenic E coli (ETEC) NOT DETECTED NOT DETECTED   Shiga like toxin producing E coli (STEC) NOT DETECTED NOT DETECTED   Shigella/Enteroinvasive E coli (EIEC)  NOT DETECTED NOT DETECTED   Cryptosporidium NOT DETECTED NOT DETECTED   Cyclospora cayetanensis NOT DETECTED NOT DETECTED   Entamoeba histolytica NOT DETECTED NOT DETECTED   Giardia lamblia NOT DETECTED NOT DETECTED   Adenovirus F40/41 NOT DETECTED NOT DETECTED   Astrovirus NOT DETECTED NOT DETECTED   Norovirus GI/GII NOT DETECTED NOT DETECTED   Rotavirus A NOT DETECTED NOT DETECTED   Sapovirus (I, II, IV, and V) NOT DETECTED NOT DETECTED  CG4 I-STAT (Lactic acid)     Status: None   Collection Time: 09/07/18  2:50 PM  Result Value Ref Range   Lactic Acid, Venous 1.43 0.5 - 1.9 mmol/L   ____________________________________________  ____________________________________________  RADIOLOGY  I personally reviewed all radiographic images ordered to evaluate for the above acute complaints and reviewed radiology reports and findings.  These findings were personally discussed with the patient.  Please see medical record for radiology report.  ____________________________________________   PROCEDURES  Procedure(s) performed:  Procedures    Critical Care performed: no ____________________________________________   INITIAL IMPRESSION / ASSESSMENT AND PLAN / ED COURSE  Pertinent labs & imaging results that were available during my care of the patient were reviewed by me and considered in my medical decision making (see chart for details).   DDX: Dehydration, enteritis, infectious colitis, radiation colitis, electrolyte abnormality  FLOR WHITACRE is a 52 y.o. who presents to the ED with symptoms as described above.  Will provide IV fluids.  Check blood work as well as stool studies.  The patient will be placed on continuous pulse oximetry and telemetry for monitoring.  Laboratory evaluation will be sent to evaluate for the above complaints.     Clinical Course as of Sep 07 1524  Sat Sep 07, 2018  1339 Patient's pain is improved.  Not having significant diarrhea but did have  some blood-tinged stool.  C. difficile is negative.  Given her history and pain will order CTA of abdomen to evaluate for any evidence of hemodynamic compromise that she may be having some component of mesenteric ischemia.   [PR]  1510 C. difficile negative.   [PR]  1521 I spoke with Dr. Janese Banks of heme-onc.  She was aware of patient's symptoms and presenting to the ER.  No further recommend agents for medical management of colitis.  Patient tolerating oral hydration.  At this point I offered admission the hospital for additional hydration however an alternative was to follow-up in clinic on Monday for additional IV fluids and reassessment.  Patient feels comfortable going home which I think is reasonable.  Have discussed with the patient and available family all diagnostics and treatments performed thus far and all questions were answered to the best of my ability. The patient demonstrates understanding and agreement with plan.    [PR]    Clinical Course User Index [PR] Merlyn Lot, MD     As part of my medical decision making, I reviewed the following data within  the electronic MEDICAL RECORD NUMBER Nursing notes reviewed and incorporated, Labs reviewed, notes from prior ED visits.  ____________________________________________   FINAL CLINICAL IMPRESSION(S) / ED DIAGNOSES  Final diagnoses:  Colitis  Dehydration      NEW MEDICATIONS STARTED DURING THIS VISIT:  New Prescriptions   No medications on file     Note:  This document was prepared using Dragon voice recognition software and may include unintentional dictation errors.    Merlyn Lot, MD 09/07/18 1525

## 2018-09-07 NOTE — ED Notes (Signed)
Pt assisted to bathroom

## 2018-09-07 NOTE — ED Triage Notes (Addendum)
C/o N&V&D x 2 days. States fever of 100 at home. Tylenol at 5am. Pt has a port to access for blood. Had chemo Tuesday, states hadn't had it for 5 weeks d/t magnesium being low. Denies blood in vomit/stool. Takes about 4 different medications for diarrhea, states that has decreased from 9 meds for diarrhea.

## 2018-09-07 NOTE — ED Notes (Signed)
Pt resting with lights off.

## 2018-09-07 NOTE — ED Notes (Signed)
Warm blanket given

## 2018-09-07 NOTE — Discharge Instructions (Signed)

## 2018-09-07 NOTE — ED Notes (Signed)
Pt given crackers and peanut butter with drink. Able to drink and eat without trouble.

## 2018-09-07 NOTE — ED Notes (Signed)
Patient transported to CT 

## 2018-09-09 ENCOUNTER — Inpatient Hospital Stay: Payer: Medicaid Other | Attending: Hematology and Oncology | Admitting: Oncology

## 2018-09-09 ENCOUNTER — Other Ambulatory Visit: Payer: Self-pay

## 2018-09-09 ENCOUNTER — Telehealth: Payer: Self-pay | Admitting: *Deleted

## 2018-09-09 ENCOUNTER — Ambulatory Visit: Payer: Medicaid Other

## 2018-09-09 ENCOUNTER — Inpatient Hospital Stay: Payer: Medicaid Other | Attending: Hematology and Oncology

## 2018-09-09 VITALS — Resp 18

## 2018-09-09 DIAGNOSIS — D500 Iron deficiency anemia secondary to blood loss (chronic): Secondary | ICD-10-CM

## 2018-09-09 DIAGNOSIS — C538 Malignant neoplasm of overlapping sites of cervix uteri: Secondary | ICD-10-CM

## 2018-09-09 DIAGNOSIS — K529 Noninfective gastroenteritis and colitis, unspecified: Secondary | ICD-10-CM

## 2018-09-09 DIAGNOSIS — Z95828 Presence of other vascular implants and grafts: Secondary | ICD-10-CM

## 2018-09-09 DIAGNOSIS — D6851 Activated protein C resistance: Secondary | ICD-10-CM | POA: Diagnosis not present

## 2018-09-09 DIAGNOSIS — C7800 Secondary malignant neoplasm of unspecified lung: Secondary | ICD-10-CM | POA: Diagnosis not present

## 2018-09-09 DIAGNOSIS — R197 Diarrhea, unspecified: Secondary | ICD-10-CM | POA: Insufficient documentation

## 2018-09-09 DIAGNOSIS — Z87891 Personal history of nicotine dependence: Secondary | ICD-10-CM | POA: Insufficient documentation

## 2018-09-09 DIAGNOSIS — Z79899 Other long term (current) drug therapy: Secondary | ICD-10-CM

## 2018-09-09 DIAGNOSIS — A0472 Enterocolitis due to Clostridium difficile, not specified as recurrent: Secondary | ICD-10-CM

## 2018-09-09 DIAGNOSIS — R110 Nausea: Secondary | ICD-10-CM

## 2018-09-09 DIAGNOSIS — Z5189 Encounter for other specified aftercare: Secondary | ICD-10-CM

## 2018-09-09 DIAGNOSIS — E860 Dehydration: Secondary | ICD-10-CM | POA: Insufficient documentation

## 2018-09-09 DIAGNOSIS — G893 Neoplasm related pain (acute) (chronic): Secondary | ICD-10-CM

## 2018-09-09 DIAGNOSIS — R531 Weakness: Secondary | ICD-10-CM

## 2018-09-09 LAB — COMPREHENSIVE METABOLIC PANEL
ALK PHOS: 234 U/L — AB (ref 38–126)
ALT: 85 U/L — ABNORMAL HIGH (ref 0–44)
ANION GAP: 7 (ref 5–15)
AST: 44 U/L — ABNORMAL HIGH (ref 15–41)
Albumin: 3.9 g/dL (ref 3.5–5.0)
BUN: 16 mg/dL (ref 6–20)
CHLORIDE: 99 mmol/L (ref 98–111)
CO2: 27 mmol/L (ref 22–32)
CREATININE: 0.63 mg/dL (ref 0.44–1.00)
Calcium: 9.3 mg/dL (ref 8.9–10.3)
GFR calc non Af Amer: >60 mL/min (ref >60)
Glucose, Bld: 117 mg/dL — ABNORMAL HIGH (ref 70–99)
POTASSIUM: 3.6 mmol/L (ref 3.5–5.1)
SODIUM: 133 mmol/L — AB (ref 135–145)
Total Bilirubin: 0.7 mg/dL (ref 0.3–1.2)
Total Protein: 7.4 g/dL (ref 6.5–8.1)

## 2018-09-09 LAB — CBC WITH DIFFERENTIAL/PLATELET
ABS IMMATURE GRANULOCYTES: 0.02 10*3/uL (ref 0.00–0.07)
BASOS ABS: 0 10*3/uL (ref 0.0–0.1)
Basophils Relative: 1 %
EOS PCT: 2 %
Eosinophils Absolute: 0.1 10*3/uL (ref 0.0–0.5)
HCT: 31.6 % — ABNORMAL LOW (ref 36.0–46.0)
HEMOGLOBIN: 10.4 g/dL — AB (ref 12.0–15.0)
IMMATURE GRANULOCYTES: 1 %
LYMPHS ABS: 0.7 10*3/uL (ref 0.7–4.0)
LYMPHS PCT: 26 %
MCH: 33.5 pg (ref 26.0–34.0)
MCHC: 32.9 g/dL (ref 30.0–36.0)
MCV: 101.9 fL — ABNORMAL HIGH (ref 80.0–100.0)
Monocytes Absolute: 0.2 10*3/uL (ref 0.1–1.0)
Monocytes Relative: 7 %
NEUTROS ABS: 1.7 10*3/uL (ref 1.7–7.7)
NRBC: 0 % (ref 0.0–0.2)
Neutrophils Relative %: 63 %
PLATELETS: 88 10*3/uL — AB (ref 150–400)
RBC: 3.1 MIL/uL — AB (ref 3.87–5.11)
RDW: 12.7 % (ref 11.5–15.5)
WBC: 2.7 10*3/uL — AB (ref 4.0–10.5)

## 2018-09-09 LAB — MAGNESIUM: MAGNESIUM: 1.8 mg/dL (ref 1.7–2.4)

## 2018-09-09 MED ORDER — HYDROCODONE-ACETAMINOPHEN 5-325 MG PO TABS: 1.0000 | ORAL_TABLET | Freq: Four times a day (QID) | ORAL | 0 refills | Status: DC | PRN

## 2018-09-09 MED ORDER — SODIUM CHLORIDE 0.9% FLUSH
10.0000 mL | INTRAVENOUS | Status: DC | PRN
  Filled 2018-09-09: qty 10

## 2018-09-09 MED ORDER — HEPARIN SOD (PORK) LOCK FLUSH 100 UNIT/ML IV SOLN: 500.0000 [IU] | Freq: Once | INTRAVENOUS | Status: DC

## 2018-09-09 MED ORDER — SODIUM CHLORIDE 0.9 % IV SOLN
Freq: Once | INTRAVENOUS | Status: AC
  Administered 2018-09-09: 11:00:00 via INTRAVENOUS
  Filled 2018-09-09: qty 250

## 2018-09-09 MED ORDER — SODIUM CHLORIDE 0.9% FLUSH
10.0000 mL | INTRAVENOUS | Status: DC | PRN
  Administered 2018-09-09: 10 mL via INTRAVENOUS
  Filled 2018-09-09: qty 10

## 2018-09-09 MED ORDER — HEPARIN SOD (PORK) LOCK FLUSH 100 UNIT/ML IV SOLN
500.0000 [IU] | Freq: Once | INTRAVENOUS | Status: AC
  Administered 2018-09-09: 500 [IU] via INTRAVENOUS

## 2018-09-09 MED ORDER — ONDANSETRON HCL 4 MG/2ML IJ SOLN
8.0000 mg | Freq: Once | INTRAMUSCULAR | Status: AC
  Administered 2018-09-09: 8 mg via INTRAVENOUS
  Filled 2018-09-09: qty 4

## 2018-09-09 MED ORDER — DEXAMETHASONE SODIUM PHOSPHATE 10 MG/ML IJ SOLN
10.0000 mg | Freq: Once | INTRAMUSCULAR | Status: AC
  Administered 2018-09-09: 10 mg via INTRAVENOUS
  Filled 2018-09-09: qty 1

## 2018-09-09 MED ORDER — MORPHINE SULFATE (PF) 2 MG/ML IV SOLN
2.0000 mg | Freq: Once | INTRAVENOUS | Status: AC
  Administered 2018-09-09: 2 mg via INTRAVENOUS
  Filled 2018-09-09: qty 1

## 2018-09-09 NOTE — Telephone Encounter (Signed)
Patient called and states she was in ER sat with n/v abdominal pain. She is feeling weak and has a head cold today. Appointment given for this mornig

## 2018-09-09 NOTE — Progress Notes (Addendum)
Symptom Management Consult note [facility]  [phone] [phone]  Patient Care Team: Arnetha Courser, MD as PCP - General (Family Medicine) Mellody Drown, MD as Consulting Physician (Obstetrics and Gynecology) Lenor Coffin, MD as Attending Physician (Gynecology) Lequita Asal, MD as Medical Oncologist (Hematology and Oncology) [name], MD as Consulting Physician (Gastroenterology) Michael Boston, MD as Consulting Physician (General Surgery) Lovell Sheehan, MD as Consulting Physician (Orthopedic Surgery) Clent Jacks, RN as Registered Nurse   Name of the patient: Joanna Hall  [id number]  [birth year]   Date of visit: 09/09/2018  Diagnosis: Dehydration  Chief Complaint: dehydration/abdominal pain  Current Treatment: S/p 4 cycles carbo/taxol. Last given 09/03/18.   Oncology History: Patient was seen and evaluated by primary oncologist Dr. Mike Gip on 09/03/2018 for follow-up assessment prior to cycle 4 carbo/Taxol.  At that visit she was feeling well. Cycle 4 had been held for over 1 month d/t neutropenia and persistent abdominal pain.  She had been seen frequently in [facility] most recently on 08/26/2018 where she received IV fluids and magneisum.  She noted her diarrhea had dramatically improved (none for 2-*3 days .  Stool was formed.  She continued taking Colestid, Lomotil, opium, amitriptyline and Viberzi as instructed by GI. She continued to complain of diffuse abdominal pain rating pain score 4/10.  LFTs continued to be elevated. Proceeded with Cycle 4 Carbo/Taxol.   She saw Dr. Marius Ditch on 08/28/2018 to discuss the complexity of her antidiarrheal medications. Given stability of diarrhea, she was encouraged to slowly come off Lomotil and opium.  Encouraged to taper off Viberzi (going from TID to BID or even daily) after beginning hydrocortisone for adrenal insufficiency.  Previous GI work-up included a normal  colonoscopy and EGD.  Diarrhea thought to be d/t bile salt induced diarrhea given history of ileocolectomy.  Diarrhea work-up revealed low serum cortisol levels hence endocrinology referral.  She was started on hydrocortisone 5 mg 3 times daily for adrenal insufficiency.  Endocrinology appointment has not been scheduled.  Referral to be made by GI.  She presented to the emergency room on 09/07/2018 for nausea vomiting and diarrhea.  She complained of worsening diffuse abdominal pain.  Work-up included lab work which showed an elevated lactic acid.  She was given 2 L of NaCl, pain medicine and IV nausea medication. Lactic acid repeat normal.  Had abdominal x-ray and abdominal pelvis CT scan revealing stable colitis.  She was discharged with close follow-up with [facility] on Monday.   Oncology History   Patient was diagnosed with cervical adenocarcinoma in [place] in 09/2016.  She has had a long-standing history of abnormal Pap smears.  She presented with an ovarian cyst.  Laparoscopic surgery was pursued but was difficult due to scar tissue.  Had BSO and rupture of cyst with purulent drainage into the abdomen.  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  Radical hysterectomy was aborted.  She was treated by Dr. Christene Slates at [facility] [place].  Decision was made to pursue concurrent chemotherapy (weekly cisplatin) and radiation.  She received treatment from 11/2016-05/2017.  01/2017 cisplatin x 2 and carboplatin x 1 (01/29/2017) due to ARF and XRT.  XRT was followed by T & O on 02/01/2017 and T & N 02/10/2017 and 02/20/2017.  Course was complicated by 80 pound weight loss, nausea, vomiting, electrolyte wasting (potassium and magnesium).  She describes that.  Is been sick constantly requiring at least  20 hospitalizations.  Follow-up CT chest and PET on 06/2017. Per patient, 'radiation worked' and no disease in the abdomen.  At that time she was noted to have lung  nodules that were growing and follow-up imaging was scheduled for 10/2017.  She was admitted to hospital in [place] for small bowel obstruction which was managed conservatively and she was home for a week prior to traveling to [place] for Thanksgiving holiday where she has family.  She presented to ER in [place] on 08/2017 with nausea, vomiting, and lower abdominal pain.  Symptoms did not respond to conservative treatment.  CT on 08/26/2017 revealed small bowel obstruction with transition in the pelvis just superior to the uterus rather was a long segment of distal ileum with fatty wall thickening compatible with chronic inflammation and/or radiation enteritis. Imaging showed numerous pulmonary nodules consistent with metastatic disease. She underwent laparotomy and right ileocolectomy on 08/31/2017 at [facility].  Surgical findings revealed a thickened, matted, and scarred piece of distal small bowel close to the ileocecal valve.  She was discharged on 09/05/2017.  Pain markedly increased in intensity and imaging was performed on 09/11/2017 which revealed: Debris within the anterior abdominal wall incision concerning for infection versus packing material, s/p post ileo-colectomy with expected postoperative changes, mild colonic ileus, numerous pulmonary nodules highly concerning for metastatic disease, punctate nonobstructing nephrolithiasis.  Staples were removed and one was packed.  She was started on doxycycline.  Abdominal and pelvic CT without contrast on 09/11/2017 revealed debris within anterior abdominal wall incision concerning for infection, versus packing material.She is s/pileocolectomy with expected postoperative changes and mild colonic ileus. There were numerous pulmonary nodules highly concerning for metastatic diseaseand punctate nonobstructing nephrolithiasis. She was readmitted on 09/12/2017. She describes the onset of lower abdominal pain on 09/09/2017. Pain  markedly increased in intensity on 09/11/2017.  Staples were removed and the wound packed. She was started on doxycycline.  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  PET scan was ordered to evaluate enlarging lung nodules with concern for recurrent cervical cancer but scan was delayed due to insurance and need to be performed in [place].  Presented to ER on 12/03/2017 for abdominal pain and emesis.  Imaging concerning for worsening possible uterine infarct and she was admitted to hospital.  Pelvic MRI was unremarkable.  Remote scarring type changes of uterus thought to be possibly related to radiation.  She was discharged on 12/08/2017.  Underwent endoscopy and colonoscopy on 12/20/2017.    On 02/22/2018 she underwent laser ablation of condylomata around the anus and vagina under anesthesia with Dr. Johney Maine.   04/15/2018: Chest, abdomen, and pelvis CTrevealed innumerable (>100) cavitary nodules scattered in the lungs, moderately enlarging compared to the 11/08/2017 PET-CT, suspicious for metastatic disease. One index node in the RLL measures 1.0 x 1.1 cm (previously 0.6 x 0.6 cm). There were no new nodules. There was an ill-defined wall thickening in the rectosigmoid with surrounding stranding along fascia planes, probably sequela from prior radiation therapy. There was multilevel lumbar impingement due to spondylosis and degenerative disc disease. There was heterogeneous enhancement in the uterus (some possibly from prior radiation therapy).  04/23/2018:PET scan  revealednumerous scattered solid and cavitary  nodules in the lungs stable increased in size compared to the prior PET-CT from 11/08/2017.Largest nodule was 1.1 cm in the LUL (SUV 1.9).These demonstratedlow-grade metabolic activity up to a maximum SUV of about 2.3, increased from 11/08/2017.   Case was discussed at tumor board on 04/25/2018. Consensus to pursue CT-guided biopsy (05/06/18) which revealed: Metastatic adenocarcinoma, morphologically consistent with cervical adenocarcinoma.  She has history of chronic hepatitis C which is managed by GI.  Hepatitis C genotype is 2a/2c.  She receives B12 injections for history of B12 deficiency.  On 04/24/2018 she underwent left total knee replacement with Dr. [name].     Malignant neoplasm of overlapping sites of cervix ([place])   09/18/2017 Initial Diagnosis    Malignant neoplasm of overlapping sites of cervix ([place])    05/15/2018 -  Chemotherapy    The patient had palonosetron (ALOXI) injection 0.25 mg, 0.25 mg, Intravenous,  Once, 4 of 6 cycles Administration: 0.25 mg (06/04/2018), 0.25 mg (06/25/2018), 0.25 mg (07/23/2018), 0.25 mg (09/03/2018) pegfilgrastim (NEULASTA) injection 6 mg, 6 mg, Subcutaneous, Once, 4 of 6 cycles Administration: 6 mg (06/06/2018), 6 mg (06/26/2018), 6 mg (07/24/2018), 6 mg (09/04/2018) CARBOplatin (PARAPLATIN) 500 mg in sodium chloride 0.9 % 250 mL chemo infusion, 500 mg (100 % of original dose 497.2 mg), Intravenous,  Once, 4 of 6 cycles Dose modification:   (original dose 497.2 mg, Cycle 1, Reason: Provider Judgment, Comment: difficulty with counts with initial chemo in [place]; advance dose as tolerated),   (original dose 497.2 mg, Cycle 5, Reason: Provider Judgment, Comment: return back to original dose) Administration: 500 mg (06/04/2018), 560 mg (06/25/2018), 500 mg (07/23/2018) PACLitaxel (TAXOL) 264 mg in sodium chloride 0.9 % 250 mL chemo infusion (> 53m/m2), 140 mg/m2 = 264 mg (100 % of original dose 140 mg/m2), Intravenous,  Once, 4 of 6 cycles Dose  modification: 140 mg/m2 (original dose 140 mg/m2, Cycle 1, Reason: Provider Judgment, Comment: difficulty with counts [place] chemo [place] advance dose as tolerated), 155 mg/m2 (original dose 140 mg/m2, Cycle 2, Reason: Provider Judgment, Comment: advance as tolerated), 155 mg/m2 (original dose 175 mg/m2, Cycle 5, Reason: Provider Judgment, Comment: continue current dose) Administration: 264 mg (06/04/2018), 294 mg (06/25/2018), 294 mg (07/23/2018), 294 mg (09/03/2018)  for chemotherapy treatment.      Subjective Data:  Subjective:     KJANNELL FRANTAis a 52y.o. female who presents for evaluation of diarrhea. Onset of diarrhea was 5 days ago. Diarrhea is occurring approximately 4 times per day. Patient describes diarrhea as watery. Diarrhea has been associated with hx of hemicolectomy d/t sbo obstruction and recent chemo. Patient denies illness in household contacts. Previous visits for diarrhea: multiple, this is a longstanding diagnosis. Last seen 2 days ago by ER. Evaluation to date: CBC, electrolytes, BUN and creatinine, stool cultures, stool for ova and parasites and CT scan abdominal x-ray.  Treatment to date: Currently on Colestid, Lomotil PRN, amitriptyline and Viberzi.   The following portions of the patient's history were reviewed and updated as appropriate: allergies, current medications, past family history, past medical history, past social history, past surgical history and problem list.  Review of Systems A comprehensive review of systems was negative except for: Constitutional: positive for fatigue, fevers and malaise Respiratory: positive for cough Cardiovascular: positive for fatigue Gastrointestinal: positive for abdominal pain, diarrhea, nausea and vomiting Musculoskeletal: positive for muscle weakness    Objective:    BP 114/81 (BP Location: Right Arm,  Patient Position: Sitting) Comment: standing bp 92/66  Pulse (!) 118   Temp 98.1 F (36.7 C)  (Tympanic)  General: alert, fatigued, no distress and pale  Hydration:  mildly dehydrated  Abdomen:    abnormal findings:  hyperactive bowel sounds and RLQ radiating down leg    Assessment:    Diarrhea d/t history of hemicolectomy and recent chemo; managed by GI   Plan:    Appropriate educational material discussed and distributed. Follow up as needed. Lab studies per orders. Medications per orders.    Recurrent/metastatic cervical cancer: Diagnosed 12/17 in [place]. S/p cisplatin x2 and carbo x1.  Discontinued d/t ARF.  Had external beam radiation followed by vaginal brachii therapy in 2018.  PET scan from 06/2017 revealed enlarging pulmonary nodules.  Developed small bowel obstruction in November 2018 requiring right ileocolectomy.  Biopsy of left upper lobe pulmonary nodule confirmed metastatic adenocarcinoma consistent with cervical origin.  Started carbo/Taxol in August 2018.  Currently s/p 4 cycles, last given on 09/03/18.  Cycle 4 was postponed for greater than 1 month d/t persistent neutropenia.  Given, frequent IV fluids she is scheduled to return to clinic on 09/11/18 and 09/16/18 for IV fluids +/-electrolyte replacement and on 09/24/18 for cycle 5 Carbo/taxol.   Dehydration/weakness: Chronic secondary to diarrhea.  Orthostatic today.  Blood pressure sitting 114/81; standing 92/66 with a heart rate of 118.  Diarrhea and abdominal pain intermittent.  Was evaluated in emergency room on Saturday with labs, 2 L NaCl, IV Phenergan, IV morphine, CT abdomen/pelvis and chest/abdominal x-ray.  Imaging revealed stable colitis.  Stool studies negative.  She was encouraged to follow-up with [facility] on Monday for further management.   Plan: Stat labs. Neutropenia (2.7, ANC normal), anemia (1.04), thrombocytopenia (88,000), elevated liver enzymes (44, 85) hyponatremia (133).  Give 1 L NaCl.  Vital signs improved after fluids.  Give 8 mg Zofran and 10 mg Decadron. Give 2 mg morphine.  Abdominal  pain.  Recent imaging did not reveal any acute abnormalities.  Has chronic colitis.  Liver enzymes trending down.  Has history of hepatitis C.  Waiting to begin treatment for hepatitis when chemotherapy is finished. RX Norco 5-325 mg q 6 hours for intermittent abdominal pain.   She is instructed to continue medications as prescribed by GI.  Scheduled to return to clinic on Wednesday for IV fluids +/-electrolyte replacement.   Greater than 50% was spent in counseling and coordination of care with this patient including but not limited to discussion of the relevant topics above (See A&P) including, but not limited to diagnosis and management of acute and chronic medical conditions.   Faythe Casa, NP 09/09/2018 3:04 PM

## 2018-09-10 ENCOUNTER — Other Ambulatory Visit: Payer: Medicaid Other

## 2018-09-10 ENCOUNTER — Ambulatory Visit: Payer: Medicaid Other | Admitting: Hematology and Oncology

## 2018-09-10 ENCOUNTER — Ambulatory Visit: Payer: Medicaid Other

## 2018-09-11 ENCOUNTER — Inpatient Hospital Stay: Payer: Medicaid Other

## 2018-09-11 DIAGNOSIS — Z5111 Encounter for antineoplastic chemotherapy: Secondary | ICD-10-CM | POA: Diagnosis not present

## 2018-09-11 DIAGNOSIS — E876 Hypokalemia: Secondary | ICD-10-CM

## 2018-09-11 DIAGNOSIS — C538 Malignant neoplasm of overlapping sites of cervix uteri: Secondary | ICD-10-CM

## 2018-09-11 LAB — CBC WITH DIFFERENTIAL/PLATELET
Abs Immature Granulocytes: 0.11 10*3/uL — ABNORMAL HIGH (ref 0.00–0.07)
Basophils Absolute: 0 10*3/uL (ref 0.0–0.1)
Basophils Relative: 1 %
Eosinophils Absolute: 0 10*3/uL (ref 0.0–0.5)
Eosinophils Relative: 1 %
HCT: 26.3 % — ABNORMAL LOW (ref 36.0–46.0)
Hemoglobin: 8.7 g/dL — ABNORMAL LOW (ref 12.0–15.0)
Immature Granulocytes: 4 %
Lymphocytes Relative: 31 %
Lymphs Abs: 0.9 10*3/uL (ref 0.7–4.0)
MCH: 34.4 pg — ABNORMAL HIGH (ref 26.0–34.0)
MCHC: 33.1 g/dL (ref 30.0–36.0)
MCV: 104 fL — ABNORMAL HIGH (ref 80.0–100.0)
Monocytes Absolute: 0.3 10*3/uL (ref 0.1–1.0)
Monocytes Relative: 11 %
Neutro Abs: 1.5 10*3/uL — ABNORMAL LOW (ref 1.7–7.7)
Neutrophils Relative %: 52 %
Platelets: 69 10*3/uL — ABNORMAL LOW (ref 150–400)
RBC: 2.53 MIL/uL — ABNORMAL LOW (ref 3.87–5.11)
RDW: 12.7 % (ref 11.5–15.5)
WBC: 2.8 10*3/uL — ABNORMAL LOW (ref 4.0–10.5)
nRBC: 0 % (ref 0.0–0.2)

## 2018-09-11 LAB — MAGNESIUM: Magnesium: 1.1 mg/dL — ABNORMAL LOW (ref 1.7–2.4)

## 2018-09-11 LAB — BASIC METABOLIC PANEL
Anion gap: 7 (ref 5–15)
BUN: 14 mg/dL (ref 6–20)
CO2: 24 mmol/L (ref 22–32)
Calcium: 8.8 mg/dL — ABNORMAL LOW (ref 8.9–10.3)
Chloride: 108 mmol/L (ref 98–111)
Creatinine, Ser: 0.66 mg/dL (ref 0.44–1.00)
GFR calc Af Amer: >60 mL/min (ref >60)
GFR calc non Af Amer: >60 mL/min (ref >60)
Glucose, Bld: 82 mg/dL (ref 70–99)
Potassium: 3.2 mmol/L — ABNORMAL LOW (ref 3.5–5.1)
Sodium: 139 mmol/L (ref 135–145)

## 2018-09-11 MED ORDER — SODIUM CHLORIDE 0.9 % IV SOLN
Freq: Once | INTRAVENOUS | Status: AC
  Administered 2018-09-11: 11:00:00 via INTRAVENOUS
  Filled 2018-09-11: qty 250

## 2018-09-11 MED ORDER — SODIUM CHLORIDE 0.9 % IV SOLN
Freq: Once | INTRAVENOUS | Status: AC
  Administered 2018-09-11: 11:00:00 via INTRAVENOUS
  Filled 2018-09-11: qty 1000

## 2018-09-11 MED ORDER — SODIUM CHLORIDE 0.9% FLUSH
10.0000 mL | INTRAVENOUS | Status: DC | PRN
  Administered 2018-09-11: 10 mL via INTRAVENOUS
  Filled 2018-09-11: qty 10

## 2018-09-11 MED ORDER — HEPARIN SOD (PORK) LOCK FLUSH 100 UNIT/ML IV SOLN
500.0000 [IU] | Freq: Once | INTRAVENOUS | Status: AC
  Administered 2018-09-11: 500 [IU] via INTRAVENOUS
  Filled 2018-09-11: qty 5

## 2018-09-11 NOTE — Progress Notes (Signed)
Per Honor Loh NP, labs reviewed and pt to receive 20 MEQs of potassium and 6g of Magnesium.

## 2018-09-16 ENCOUNTER — Inpatient Hospital Stay: Payer: Medicaid Other | Attending: Hematology and Oncology

## 2018-09-16 ENCOUNTER — Inpatient Hospital Stay: Payer: Medicaid Other

## 2018-09-16 VITALS — BP 122/80 | HR 80 | Resp 18

## 2018-09-16 DIAGNOSIS — C7800 Secondary malignant neoplasm of unspecified lung: Secondary | ICD-10-CM | POA: Diagnosis not present

## 2018-09-16 DIAGNOSIS — C538 Malignant neoplasm of overlapping sites of cervix uteri: Secondary | ICD-10-CM | POA: Diagnosis not present

## 2018-09-16 DIAGNOSIS — K648 Other hemorrhoids: Secondary | ICD-10-CM | POA: Insufficient documentation

## 2018-09-16 DIAGNOSIS — Z5111 Encounter for antineoplastic chemotherapy: Secondary | ICD-10-CM | POA: Insufficient documentation

## 2018-09-16 DIAGNOSIS — Z923 Personal history of irradiation: Secondary | ICD-10-CM | POA: Insufficient documentation

## 2018-09-16 DIAGNOSIS — K625 Hemorrhage of anus and rectum: Secondary | ICD-10-CM | POA: Diagnosis not present

## 2018-09-16 DIAGNOSIS — Z79899 Other long term (current) drug therapy: Secondary | ICD-10-CM | POA: Diagnosis not present

## 2018-09-16 DIAGNOSIS — D696 Thrombocytopenia, unspecified: Secondary | ICD-10-CM | POA: Insufficient documentation

## 2018-09-16 DIAGNOSIS — D6851 Activated protein C resistance: Secondary | ICD-10-CM | POA: Diagnosis not present

## 2018-09-16 DIAGNOSIS — E871 Hypo-osmolality and hyponatremia: Secondary | ICD-10-CM | POA: Diagnosis not present

## 2018-09-16 DIAGNOSIS — E860 Dehydration: Secondary | ICD-10-CM | POA: Diagnosis not present

## 2018-09-16 DIAGNOSIS — Z87891 Personal history of nicotine dependence: Secondary | ICD-10-CM | POA: Diagnosis not present

## 2018-09-16 DIAGNOSIS — E538 Deficiency of other specified B group vitamins: Secondary | ICD-10-CM | POA: Diagnosis not present

## 2018-09-16 DIAGNOSIS — D649 Anemia, unspecified: Secondary | ICD-10-CM | POA: Insufficient documentation

## 2018-09-16 DIAGNOSIS — R197 Diarrhea, unspecified: Secondary | ICD-10-CM | POA: Insufficient documentation

## 2018-09-16 DIAGNOSIS — R748 Abnormal levels of other serum enzymes: Secondary | ICD-10-CM | POA: Diagnosis not present

## 2018-09-16 DIAGNOSIS — R300 Dysuria: Secondary | ICD-10-CM | POA: Insufficient documentation

## 2018-09-16 DIAGNOSIS — A0472 Enterocolitis due to Clostridium difficile, not specified as recurrent: Secondary | ICD-10-CM | POA: Insufficient documentation

## 2018-09-16 DIAGNOSIS — Z96652 Presence of left artificial knee joint: Secondary | ICD-10-CM | POA: Insufficient documentation

## 2018-09-16 DIAGNOSIS — E2740 Unspecified adrenocortical insufficiency: Secondary | ICD-10-CM | POA: Diagnosis not present

## 2018-09-16 LAB — COMPREHENSIVE METABOLIC PANEL
ALT: 38 U/L (ref 0–44)
AST: 28 U/L (ref 15–41)
Albumin: 3.4 g/dL — ABNORMAL LOW (ref 3.5–5.0)
Alkaline Phosphatase: 148 U/L — ABNORMAL HIGH (ref 38–126)
Anion gap: 7 (ref 5–15)
BUN: 12 mg/dL (ref 6–20)
CO2: 27 mmol/L (ref 22–32)
Calcium: 8.6 mg/dL — ABNORMAL LOW (ref 8.9–10.3)
Chloride: 103 mmol/L (ref 98–111)
Creatinine, Ser: 0.75 mg/dL (ref 0.44–1.00)
GFR calc Af Amer: >60 mL/min (ref >60)
GFR calc non Af Amer: >60 mL/min (ref >60)
Glucose, Bld: 120 mg/dL — ABNORMAL HIGH (ref 70–99)
Potassium: 3.6 mmol/L (ref 3.5–5.1)
Sodium: 137 mmol/L (ref 135–145)
Total Bilirubin: 0.3 mg/dL (ref 0.3–1.2)
Total Protein: 6.7 g/dL (ref 6.5–8.1)

## 2018-09-16 LAB — CBC WITH DIFFERENTIAL/PLATELET
Abs Immature Granulocytes: 0.08 10*3/uL — ABNORMAL HIGH (ref 0.00–0.07)
Basophils Absolute: 0 10*3/uL (ref 0.0–0.1)
Basophils Relative: 0 %
Eosinophils Absolute: 0 10*3/uL (ref 0.0–0.5)
Eosinophils Relative: 1 %
HCT: 27.4 % — ABNORMAL LOW (ref 36.0–46.0)
Hemoglobin: 9 g/dL — ABNORMAL LOW (ref 12.0–15.0)
Immature Granulocytes: 2 %
Lymphocytes Relative: 11 %
Lymphs Abs: 0.4 10*3/uL — ABNORMAL LOW (ref 0.7–4.0)
MCH: 34.9 pg — ABNORMAL HIGH (ref 26.0–34.0)
MCHC: 32.8 g/dL (ref 30.0–36.0)
MCV: 106.2 fL — ABNORMAL HIGH (ref 80.0–100.0)
Monocytes Absolute: 0.3 10*3/uL (ref 0.1–1.0)
Monocytes Relative: 7 %
Neutro Abs: 3.3 10*3/uL (ref 1.7–7.7)
Neutrophils Relative %: 79 %
Platelets: 72 10*3/uL — ABNORMAL LOW (ref 150–400)
RBC: 2.58 MIL/uL — ABNORMAL LOW (ref 3.87–5.11)
RDW: 13.7 % (ref 11.5–15.5)
WBC: 4.1 10*3/uL (ref 4.0–10.5)
nRBC: 0.5 % — ABNORMAL HIGH (ref 0.0–0.2)

## 2018-09-16 LAB — MAGNESIUM: Magnesium: 1.2 mg/dL — ABNORMAL LOW (ref 1.7–2.4)

## 2018-09-16 MED ORDER — MAGNESIUM SULFATE 4 GM/100ML IV SOLN
4.0000 g | Freq: Once | INTRAVENOUS | Status: AC
  Administered 2018-09-16: 4 g via INTRAVENOUS
  Filled 2018-09-16: qty 100

## 2018-09-16 MED ORDER — ONDANSETRON HCL 4 MG/2ML IJ SOLN
8.0000 mg | Freq: Once | INTRAMUSCULAR | Status: AC
  Administered 2018-09-16: 8 mg via INTRAVENOUS
  Filled 2018-09-16: qty 4

## 2018-09-16 MED ORDER — SODIUM CHLORIDE 0.9% FLUSH
10.0000 mL | INTRAVENOUS | Status: DC | PRN
  Administered 2018-09-16: 10 mL via INTRAVENOUS
  Filled 2018-09-16: qty 10

## 2018-09-16 MED ORDER — HEPARIN SOD (PORK) LOCK FLUSH 100 UNIT/ML IV SOLN
500.0000 [IU] | Freq: Once | INTRAVENOUS | Status: AC
  Administered 2018-09-16: 500 [IU] via INTRAVENOUS
  Filled 2018-09-16: qty 5

## 2018-09-18 ENCOUNTER — Ambulatory Visit: Payer: Medicaid Other

## 2018-09-20 ENCOUNTER — Telehealth: Payer: Self-pay | Admitting: *Deleted

## 2018-09-20 ENCOUNTER — Other Ambulatory Visit: Payer: Self-pay

## 2018-09-20 ENCOUNTER — Inpatient Hospital Stay: Payer: Medicaid Other

## 2018-09-20 ENCOUNTER — Inpatient Hospital Stay (HOSPITAL_BASED_OUTPATIENT_CLINIC_OR_DEPARTMENT_OTHER): Payer: Medicaid Other | Admitting: Oncology

## 2018-09-20 VITALS — BP 112/76 | HR 78

## 2018-09-20 DIAGNOSIS — Z5189 Encounter for other specified aftercare: Secondary | ICD-10-CM

## 2018-09-20 DIAGNOSIS — R42 Dizziness and giddiness: Secondary | ICD-10-CM

## 2018-09-20 DIAGNOSIS — Z923 Personal history of irradiation: Secondary | ICD-10-CM

## 2018-09-20 DIAGNOSIS — R531 Weakness: Secondary | ICD-10-CM

## 2018-09-20 DIAGNOSIS — C7800 Secondary malignant neoplasm of unspecified lung: Secondary | ICD-10-CM

## 2018-09-20 DIAGNOSIS — Z87891 Personal history of nicotine dependence: Secondary | ICD-10-CM

## 2018-09-20 DIAGNOSIS — R110 Nausea: Secondary | ICD-10-CM

## 2018-09-20 DIAGNOSIS — Z5111 Encounter for antineoplastic chemotherapy: Secondary | ICD-10-CM | POA: Diagnosis not present

## 2018-09-20 DIAGNOSIS — E860 Dehydration: Secondary | ICD-10-CM | POA: Diagnosis not present

## 2018-09-20 DIAGNOSIS — R197 Diarrhea, unspecified: Secondary | ICD-10-CM

## 2018-09-20 DIAGNOSIS — D6851 Activated protein C resistance: Secondary | ICD-10-CM | POA: Diagnosis not present

## 2018-09-20 DIAGNOSIS — Z95828 Presence of other vascular implants and grafts: Secondary | ICD-10-CM

## 2018-09-20 DIAGNOSIS — C538 Malignant neoplasm of overlapping sites of cervix uteri: Secondary | ICD-10-CM

## 2018-09-20 DIAGNOSIS — Z79899 Other long term (current) drug therapy: Secondary | ICD-10-CM

## 2018-09-20 LAB — CBC WITH DIFFERENTIAL/PLATELET
Abs Immature Granulocytes: 0.03 10*3/uL (ref 0.00–0.07)
Basophils Absolute: 0 10*3/uL (ref 0.0–0.1)
Basophils Relative: 0 %
Eosinophils Absolute: 0 10*3/uL (ref 0.0–0.5)
Eosinophils Relative: 0 %
HCT: 32.2 % — ABNORMAL LOW (ref 36.0–46.0)
Hemoglobin: 10.4 g/dL — ABNORMAL LOW (ref 12.0–15.0)
Immature Granulocytes: 1 %
Lymphocytes Relative: 15 %
Lymphs Abs: 0.8 10*3/uL (ref 0.7–4.0)
MCH: 34.3 pg — ABNORMAL HIGH (ref 26.0–34.0)
MCHC: 32.3 g/dL (ref 30.0–36.0)
MCV: 106.3 fL — ABNORMAL HIGH (ref 80.0–100.0)
MONOS PCT: 5 %
Monocytes Absolute: 0.3 10*3/uL (ref 0.1–1.0)
Neutro Abs: 4.3 10*3/uL (ref 1.7–7.7)
Neutrophils Relative %: 79 %
Platelets: 117 10*3/uL — ABNORMAL LOW (ref 150–400)
RBC: 3.03 MIL/uL — ABNORMAL LOW (ref 3.87–5.11)
RDW: 14.2 % (ref 11.5–15.5)
WBC: 5.5 10*3/uL (ref 4.0–10.5)
nRBC: 0 % (ref 0.0–0.2)

## 2018-09-20 LAB — COMPREHENSIVE METABOLIC PANEL
ALT: 43 U/L (ref 0–44)
AST: 27 U/L (ref 15–41)
Albumin: 3.9 g/dL (ref 3.5–5.0)
Alkaline Phosphatase: 160 U/L — ABNORMAL HIGH (ref 38–126)
Anion gap: 8 (ref 5–15)
BUN: 16 mg/dL (ref 6–20)
CO2: 25 mmol/L (ref 22–32)
Calcium: 9.1 mg/dL (ref 8.9–10.3)
Chloride: 103 mmol/L (ref 98–111)
Creatinine, Ser: 0.86 mg/dL (ref 0.44–1.00)
GFR calc Af Amer: >60 mL/min (ref >60)
Glucose, Bld: 105 mg/dL — ABNORMAL HIGH (ref 70–99)
Potassium: 3.8 mmol/L (ref 3.5–5.1)
Sodium: 136 mmol/L (ref 135–145)
Total Bilirubin: 0.5 mg/dL (ref 0.3–1.2)
Total Protein: 7.5 g/dL (ref 6.5–8.1)

## 2018-09-20 LAB — MAGNESIUM: MAGNESIUM: 1.5 mg/dL — AB (ref 1.7–2.4)

## 2018-09-20 MED ORDER — SODIUM CHLORIDE 0.9% FLUSH
10.0000 mL | INTRAVENOUS | Status: DC | PRN
  Administered 2018-09-20: 10 mL via INTRAVENOUS
  Filled 2018-09-20: qty 10

## 2018-09-20 MED ORDER — SODIUM CHLORIDE 0.9 % IV SOLN
INTRAVENOUS | Status: DC
  Administered 2018-09-20: 13:00:00 via INTRAVENOUS
  Filled 2018-09-20 (×2): qty 250

## 2018-09-20 MED ORDER — HEPARIN SOD (PORK) LOCK FLUSH 100 UNIT/ML IV SOLN
500.0000 [IU] | Freq: Once | INTRAVENOUS | Status: AC
  Administered 2018-09-20: 500 [IU] via INTRAVENOUS

## 2018-09-20 MED ORDER — SODIUM CHLORIDE 0.9 % IV SOLN: 4.0000 g | Freq: Once | INTRAVENOUS | Status: DC

## 2018-09-20 MED ORDER — ONDANSETRON HCL 4 MG/2ML IJ SOLN
8.0000 mg | Freq: Once | INTRAMUSCULAR | Status: AC
  Administered 2018-09-20: 8 mg via INTRAVENOUS
  Filled 2018-09-20: qty 4

## 2018-09-20 MED ORDER — MAGNESIUM SULFATE 4 GM/100ML IV SOLN
4.0000 g | Freq: Once | INTRAVENOUS | Status: AC
  Administered 2018-09-20: 4 g via INTRAVENOUS

## 2018-09-20 NOTE — Telephone Encounter (Signed)
Patient called reporting dizziness and seeing spots before her eyes time 3 days and is asking if she needs to come in for IV fluids.Please advise

## 2018-09-20 NOTE — Telephone Encounter (Signed)
Per Lorretta Harp, see patient in [facility] and check cbc, cmp Patient accepts appointment and will be here by 1130

## 2018-09-20 NOTE — Progress Notes (Signed)
Symptom Management Consult note [facility]  [phone] [phone]  Patient Care Team: Arnetha Courser, MD as PCP - General (Family Medicine) Mellody Drown, MD as Consulting Physician (Obstetrics and Gynecology) Lenor Coffin, MD as Attending Physician (Gynecology) Lequita Asal, MD as Medical Oncologist (Hematology and Oncology) [name], MD as Consulting Physician (Gastroenterology) Michael Boston, MD as Consulting Physician (General Surgery) Lovell Sheehan, MD as Consulting Physician (Orthopedic Surgery) Clent Jacks, RN as Registered Nurse   Name of the patient: Joanna Hall  [id number]  1966/03/23   Date of visit: 09/20/2018  Diagnosis: 1. Hypomagnesemia - Magnesium; Future   Chief Complaint: dizziness  Current Treatment: s/p cycle 4 carbo/Taxol. Last given on 09/03/18.  Cycle 4 postponed due to neutropenia.  Oncology History: Patient last seen by Dr. Mike Gip primary medical oncologist on 09/03/2018 prior to cycle 4 carbo/Taxol.  At that visit, she continued to have persistent diarrhea but noted mild improvement with new medication regimen.  She admitted to feeling better post weekly IV fluids.  Dizziness had improved.  Appetite improved.  Lab revealed hypomagnesemia (1.4).  They proceeded with cycle 4 carbo/Taxol and 4 g magnesium.  In the interim, she was seen by Dr. Theora Gianotti on 09/04/2018 for assessment and pelvic exam.  She agreed to continue carbo/Taxol and given PDL 1+ disease she would be a candidate for Keytruda in the future should she progress.  Scheduled to see her back in 3 to 4 months.  She was seen in the emergency room on 09/07/2018 for nausea, vomiting and diarrhea x2 days.  Admitted to fever T-max 100.0 at home.  Had elevated lactic acid.  Lactic acid repeat normal.  Abdominal imaging revealed stable colitis.  She was given IV fluids, IV pain medication, stool work-up with labs.  She  was discharged with close follow-up with [facility] on Monday.  She was evaluated in [facility] on 09/09/2018 by me for dehydration and abdominal pain.  She was orthostatic and tachycardic.  She was given additional IV fluids, Zofran and Decadron.  She was given 2 mg morphine for abdominal pain and given a prescription for Norco 5/325 mg every 6 hours for intermittent abdominal pain. This improved symptoms.  Given frequency of fluid administration, she was scheduled for additional fluids every 2 to 3 days.  Oncology History   Patient was diagnosed with cervical adenocarcinoma in [place] in 09/2016.  She has had a long-standing history of abnormal Pap smears.  She presented with an ovarian cyst.  Laparoscopic surgery was pursued but was difficult due to scar tissue.  Had BSO and rupture of cyst with purulent drainage into the abdomen.  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  Radical hysterectomy was aborted.  She was treated by Dr. Christene Slates at [facility] in [place], [place].  Decision was made to pursue concurrent chemotherapy (weekly cisplatin) and radiation.  She received treatment from 11/2016-05/2017.  01/2017 cisplatin x 2 and carboplatin x 1 (01/29/2017) due to ARF and XRT.  XRT was followed by T & O on 02/01/2017 and T & N 02/10/2017 and 02/20/2017.  Course was complicated by 80 pound weight loss, nausea, vomiting, electrolyte wasting (potassium and magnesium).  She describes that.  Is been sick constantly requiring at least 20 hospitalizations.  Follow-up CT chest and PET on 06/2017. Per patient, 'radiation worked' and no disease in the abdomen.  At that time she was noted to have lung nodules that were  growing and follow-up imaging was scheduled for 10/2017.  She was admitted to hospital in [place] for small bowel obstruction which was managed conservatively and she was home for a week prior to traveling to [place] for Thanksgiving holiday where she  has family.  She presented to ER in [place] on 08/2017 with nausea, vomiting, and lower abdominal pain.  Symptoms did not respond to conservative treatment.  CT on 08/26/2017 revealed small bowel obstruction with transition in the pelvis just superior to the uterus rather was a long segment of distal ileum with fatty wall thickening compatible with chronic inflammation and/or radiation enteritis. Imaging showed numerous pulmonary nodules consistent with metastatic disease. She underwent laparotomy and right ileocolectomy on 08/31/2017 at [facility].  Surgical findings revealed a thickened, matted, and scarred piece of distal small bowel close to the ileocecal valve.  She was discharged on 09/05/2017.  Pain markedly increased in intensity and imaging was performed on 09/11/2017 which revealed: Debris within the anterior abdominal wall incision concerning for infection versus packing material, s/p post ileo-colectomy with expected postoperative changes, mild colonic ileus, numerous pulmonary nodules highly concerning for metastatic disease, punctate nonobstructing nephrolithiasis.  Staples were removed and one was packed.  She was started on doxycycline.  Abdominal and pelvic CT without contrast on 09/11/2017 revealed debris within anterior abdominal wall incision concerning for infection, versus packing material.She is s/pileocolectomy with expected postoperative changes and mild colonic ileus. There were numerous pulmonary nodules highly concerning for metastatic diseaseand punctate nonobstructing nephrolithiasis. She was readmitted on 09/12/2017. She describes the onset of lower abdominal pain on 09/09/2017. Pain markedly increased in intensity on 09/11/2017.  Staples were removed and the wound packed. She was started on doxycycline.  CT on 09/13/2017 showed postsurgical changes from ileocecectomy with primary ileocolic anastomosis without evidence of abscess or leak, edema small bowel loops of  distal ileum, gas within ventral midline surgical wound corresponding to wound infection versus packing material, small infarct at the inferior pole of left kidney, right uterine infarct.  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  PET scan was ordered to evaluate enlarging lung nodules with concern for recurrent cervical cancer but scan was delayed due to insurance and need to be performed in [place].  Presented to ER on 12/03/2017 for abdominal pain and emesis.  Imaging concerning for worsening possible uterine infarct and she was admitted to hospital.  Pelvic MRI was unremarkable.  Remote scarring type changes of uterus thought to be possibly related to radiation.  She was discharged on 12/08/2017.  Underwent endoscopy and colonoscopy on 12/20/2017.    On 02/22/2018 she underwent laser ablation of condylomata around the anus and vagina under anesthesia with Dr. Johney Maine.   04/15/2018: Chest, abdomen, and pelvis CTrevealed innumerable (>100) cavitary nodules scattered in the lungs, moderately enlarging compared to the 11/08/2017 PET-CT, suspicious for metastatic disease. One index node in the RLL measures 1.0 x 1.1 cm (previously 0.6 x 0.6 cm). There were no new nodules. There was an ill-defined wall thickening in the rectosigmoid with surrounding stranding along fascia planes, probably sequela from prior radiation therapy. There was multilevel lumbar impingement due to spondylosis and degenerative disc disease. There was heterogeneous enhancement in the uterus (some possibly from prior radiation therapy).  04/23/2018:PET scan revealednumerous scattered solid and cavitary nodules in the lungs stable increased in size compared to the prior PET-CT from 11/08/2017.Largest nodule was 1.1 cm in the LUL (SUV 1.9).These demonstratedlow-grade metabolic activity up  to a maximum SUV of about 2.3, increased from 11/08/2017.   Case was discussed at tumor board  on 04/25/2018. Consensus to pursue CT-guided biopsy (05/06/18) which revealed: Metastatic adenocarcinoma, morphologically consistent with cervical adenocarcinoma.  She has history of chronic hepatitis C which is managed by GI.  Hepatitis C genotype is 2a/2c.  She receives B12 injections for history of B12 deficiency.  On 04/24/2018 she underwent left total knee replacement with Dr. [name].     Malignant neoplasm of overlapping sites of cervix ([place])   09/18/2017 Initial Diagnosis    Malignant neoplasm of overlapping sites of cervix ([place])    05/15/2018 -  Chemotherapy    The patient had palonosetron (ALOXI) injection 0.25 mg, 0.25 mg, Intravenous,  Once, 4 of 6 cycles Administration: 0.25 mg (06/04/2018), 0.25 mg (06/25/2018), 0.25 mg (07/23/2018), 0.25 mg (09/03/2018) pegfilgrastim (NEULASTA) injection 6 mg, 6 mg, Subcutaneous, Once, 4 of 6 cycles Administration: 6 mg (06/06/2018), 6 mg (06/26/2018), 6 mg (07/24/2018), 6 mg (09/04/2018) CARBOplatin (PARAPLATIN) 500 mg in sodium chloride 0.9 % 250 mL chemo infusion, 500 mg (100 % of original dose 497.2 mg), Intravenous,  Once, 4 of 6 cycles Dose modification:   (original dose 497.2 mg, Cycle 1, Reason: Provider Judgment, Comment: difficulty with counts with initial chemo in [place]; advance dose as tolerated),   (original dose 497.2 mg, Cycle 5, Reason: Provider Judgment, Comment: return back to original dose) Administration: 500 mg (06/04/2018), 560 mg (06/25/2018), 500 mg (07/23/2018) PACLitaxel (TAXOL) 264 mg in sodium chloride 0.9 % 250 mL chemo infusion (> 6m/m2), 140 mg/m2 = 264 mg (100 % of original dose 140 mg/m2), Intravenous,  Once, 4 of 6 cycles Dose modification: 140 mg/m2 (original dose 140 mg/m2, Cycle 1, Reason: Provider Judgment, Comment: difficulty with counts [place] chemo [place] advance dose as tolerated), 155 mg/m2 (original dose 140 mg/m2, Cycle 2, Reason: Provider Judgment, Comment: advance as tolerated), 155 mg/m2  (original dose 175 mg/m2, Cycle 5, Reason: Provider Judgment, Comment: continue current dose) Administration: 264 mg (06/04/2018), 294 mg (06/25/2018), 294 mg (07/23/2018), 294 mg (09/03/2018)  for chemotherapy treatment.      Subjective Data:  ECOG: 1 - Symptomatic but completely ambulatory  Subjective:     Joanna COGHILLis a 52y.o. female who presents for evaluation of nausea, vomiting, and dehydration.  It has been present for 4 days.  Associated signs & symptoms:  moderate abdominal pain and diarrhea occurring 4-6 times daily; chronic.  Has had several previous work-ups for diarrhea which are negative for infection.  Has been seen by GI and diarrhea is thought to be secondary to chemotherapy as well as bile salt induced diarrhea given history of ileocolectomy.  Most recent imaging showed stable chronic colitis. Recently started on hydrocortisone for adrenal insufficiency.  Endocrinology referral pending.   The following portions of the patient's history were reviewed and updated as appropriate: allergies, current medications, past family history, past medical history, past social history, past surgical history and problem list. Review of Systems A comprehensive review of systems was negative except for: Constitutional: positive for fatigue and malaise Respiratory: positive for dyspnea on exertion Cardiovascular: positive for fatigue, tachypnea and tachycardia Gastrointestinal: positive for abdominal pain, diarrhea and nausea Musculoskeletal: positive for muscle weakness       Objective:    BP 126/87 (BP Location: Right Arm, Patient Position: Sitting) Comment: standing bp 109/75  Pulse (!) 108 Comment: standing pulse 119  Temp (!) 97.5 F (36.4 C) (Tympanic)  Resp 18  General appearance: alert, fatigued, no distress and pale Heart: tachycardia Abdomen: abnormal findings:  mild tenderness in the upper abdomen    Assessment:     Diarrhea d/t history of hemicolectomy and recent  chemo; chronic colitis managed by GI.   Plan:    Antiemetics per medication orders. IV fluids per orders. Labs per orders.    Recurrent/metastatic cervical cancer: Diagnosed in 12/17 in [place].  Status post cisplatin x2 and carbo x1.  Discontinued due to ARF.  Had external beam radiation followed by vaginal brachii therapy in 2018.  PET scan from 06/2017 revealed enlarging pulmonary nodules.  Developed small bowel obstruction in November 2018 requiring right ileocolectomy.  Biopsy of left upper lobe pulmonary nodule confirmed metastatic adenocarcinoma consistent with cervical origin. Started carbo/Taxol in August 2018. Currently status post 4 cycles, last given on 09/03/2018.  Cycle 4 was postponed for greater than 1 month due to persistent neutropenia. Requiring frequent IV fluids on scheduled basis.  Scheduled for cycle 5 carbo/Taxol on 09/24/2018.  Dehydration/dizziness: Secondary to diarrhea.  Continues to be orthostatic resulting in dizziness when standing.  Intermittent abdominal pain and diarrhea.  Requiring intravenous hydration +/- electrolyte replacment several times per week.  Encouraged to continue anti-diarrheal medications as prescribed by Dr. Marius Ditch.  Regimen simplified by GI.  Diarrhea work-up additionally revealed low serum cortisol levels.  Started on hydrocortisone 5 mg 3 times daily for adrenal insufficiency. Awaiting endocrinology appt.  Plan: Stat labs.  Hypomagnesemia (1.5).  Give 4 g magnesium in clinic today. Give 1 L NaCl in clinic today. Give 8 mg Zofran in clinic today.  Symptoms improved with IV fluids.  Patient admitted to "feeling better" after IV fluids, IV nausea medication and IV magnesium.  Instructed her to call if symptoms continued and/or worsened.  She is scheduled to return on Tuesday for assessment prior to cycle 5.  Greater than 50% was spent in counseling and coordination of care with this patient including but not limited to discussion of the  relevant topics above (See A&P) including, but not limited to diagnosis and management of acute and chronic medical conditions.   Faythe Casa, NP 09/20/2018 3:31 PM

## 2018-09-23 ENCOUNTER — Other Ambulatory Visit: Payer: Self-pay

## 2018-09-24 ENCOUNTER — Inpatient Hospital Stay (HOSPITAL_BASED_OUTPATIENT_CLINIC_OR_DEPARTMENT_OTHER): Payer: Medicaid Other | Admitting: Hematology and Oncology

## 2018-09-24 ENCOUNTER — Inpatient Hospital Stay: Payer: Medicaid Other

## 2018-09-24 ENCOUNTER — Encounter: Payer: Self-pay | Admitting: Hematology and Oncology

## 2018-09-24 VITALS — BP 113/76 | HR 106 | Temp 97.2°F | Wt 171.2 lb

## 2018-09-24 DIAGNOSIS — D6851 Activated protein C resistance: Secondary | ICD-10-CM | POA: Diagnosis not present

## 2018-09-24 DIAGNOSIS — C7800 Secondary malignant neoplasm of unspecified lung: Secondary | ICD-10-CM | POA: Diagnosis not present

## 2018-09-24 DIAGNOSIS — E2740 Unspecified adrenocortical insufficiency: Secondary | ICD-10-CM

## 2018-09-24 DIAGNOSIS — R197 Diarrhea, unspecified: Secondary | ICD-10-CM

## 2018-09-24 DIAGNOSIS — E860 Dehydration: Secondary | ICD-10-CM

## 2018-09-24 DIAGNOSIS — C538 Malignant neoplasm of overlapping sites of cervix uteri: Secondary | ICD-10-CM | POA: Diagnosis not present

## 2018-09-24 DIAGNOSIS — Z7901 Long term (current) use of anticoagulants: Secondary | ICD-10-CM

## 2018-09-24 DIAGNOSIS — E538 Deficiency of other specified B group vitamins: Secondary | ICD-10-CM

## 2018-09-24 DIAGNOSIS — Z923 Personal history of irradiation: Secondary | ICD-10-CM

## 2018-09-24 DIAGNOSIS — Z87891 Personal history of nicotine dependence: Secondary | ICD-10-CM

## 2018-09-24 DIAGNOSIS — Z5111 Encounter for antineoplastic chemotherapy: Secondary | ICD-10-CM | POA: Diagnosis not present

## 2018-09-24 DIAGNOSIS — Z7189 Other specified counseling: Secondary | ICD-10-CM

## 2018-09-24 DIAGNOSIS — Z79899 Other long term (current) drug therapy: Secondary | ICD-10-CM

## 2018-09-24 LAB — CBC WITH DIFFERENTIAL/PLATELET
Abs Immature Granulocytes: 0.01 10*3/uL (ref 0.00–0.07)
Basophils Absolute: 0 10*3/uL (ref 0.0–0.1)
Basophils Relative: 0 %
Eosinophils Absolute: 0 10*3/uL (ref 0.0–0.5)
Eosinophils Relative: 0 %
HCT: 31.3 % — ABNORMAL LOW (ref 36.0–46.0)
Hemoglobin: 10.1 g/dL — ABNORMAL LOW (ref 12.0–15.0)
Immature Granulocytes: 0 %
Lymphocytes Relative: 18 %
Lymphs Abs: 0.7 10*3/uL (ref 0.7–4.0)
MCH: 34.4 pg — ABNORMAL HIGH (ref 26.0–34.0)
MCHC: 32.3 g/dL (ref 30.0–36.0)
MCV: 106.5 fL — ABNORMAL HIGH (ref 80.0–100.0)
Monocytes Absolute: 0.2 10*3/uL (ref 0.1–1.0)
Monocytes Relative: 5 %
Neutro Abs: 3 10*3/uL (ref 1.7–7.7)
Neutrophils Relative %: 77 %
Platelets: 130 10*3/uL — ABNORMAL LOW (ref 150–400)
RBC: 2.94 MIL/uL — ABNORMAL LOW (ref 3.87–5.11)
RDW: 14.1 % (ref 11.5–15.5)
WBC: 3.9 10*3/uL — ABNORMAL LOW (ref 4.0–10.5)
nRBC: 0 % (ref 0.0–0.2)

## 2018-09-24 LAB — COMPREHENSIVE METABOLIC PANEL
ALT: 39 U/L (ref 0–44)
AST: 30 U/L (ref 15–41)
Albumin: 3.9 g/dL (ref 3.5–5.0)
Alkaline Phosphatase: 141 U/L — ABNORMAL HIGH (ref 38–126)
Anion gap: 7 (ref 5–15)
BUN: 14 mg/dL (ref 6–20)
CO2: 26 mmol/L (ref 22–32)
Calcium: 9.4 mg/dL (ref 8.9–10.3)
Chloride: 102 mmol/L (ref 98–111)
Creatinine, Ser: 0.76 mg/dL (ref 0.44–1.00)
GFR calc Af Amer: >60 mL/min (ref >60)
GFR calc non Af Amer: >60 mL/min (ref >60)
Glucose, Bld: 94 mg/dL (ref 70–99)
Potassium: 3.9 mmol/L (ref 3.5–5.1)
Sodium: 135 mmol/L (ref 135–145)
Total Bilirubin: 0.6 mg/dL (ref 0.3–1.2)
Total Protein: 7.2 g/dL (ref 6.5–8.1)

## 2018-09-24 LAB — MAGNESIUM: Magnesium: 1.6 mg/dL — ABNORMAL LOW (ref 1.7–2.4)

## 2018-09-24 MED ORDER — MAGNESIUM SULFATE 2 GM/50ML IV SOLN
2.0000 g | Freq: Once | INTRAVENOUS | Status: AC
  Administered 2018-09-24: 2 g via INTRAVENOUS
  Filled 2018-09-24: qty 50

## 2018-09-24 MED ORDER — SODIUM CHLORIDE 0.9% FLUSH
10.0000 mL | INTRAVENOUS | Status: DC | PRN
  Administered 2018-09-24: 10 mL via INTRAVENOUS
  Filled 2018-09-24: qty 10

## 2018-09-24 MED ORDER — SODIUM CHLORIDE 0.9 % IV SOLN
Freq: Once | INTRAVENOUS | Status: AC
  Administered 2018-09-24: 11:00:00 via INTRAVENOUS
  Filled 2018-09-24: qty 250

## 2018-09-24 MED ORDER — HEPARIN SOD (PORK) LOCK FLUSH 100 UNIT/ML IV SOLN
500.0000 [IU] | Freq: Once | INTRAVENOUS | Status: AC
  Administered 2018-09-24: 500 [IU] via INTRAVENOUS
  Filled 2018-09-24: qty 5

## 2018-09-24 NOTE — Progress Notes (Signed)
Patient states she is feeling light headed and is seeing sparkles of light.

## 2018-09-24 NOTE — Progress Notes (Signed)
[facility] day:  09/24/2018   Chief Complaint: Joanna Hall is a 52 y.o. female with stage IV cervical cancer who is seen for assessment prior to cycle #5 carboplatin and Taxol.  HPI:  The patient was last seen in the medical oncology clinic on 09/03/2018.  At that time, she felt good.  She denied any diarrhea.  Stools were formed.  She received cycle #4 carboplatin and Taxol.  She saw Dr. Theora Gianotti on 09/04/2018.  Plan was to continue carboplatin and Taxol.  As she is PDL-1 positive, she was felt to be a candidate for Keytruda if she progresses.  She was seen in the [facility] ER on 09/07/2018 for emesis and diarrhea.  Notes reviewed.  She received 2 liters NS, pain medication, and IV anti-emetics.  C diff was negative.  CT angiogram of the abdomen and pelvis revealed colitis, mainly affecting lower loops, question radiation colitis if there has been radiotherapy of the patient's cervical cancer.  There was known pulmonary metastases.  She was seen in the symptom management clinic on 09/09/2018 and 09/20/2018 by Faythe Casa, NP.  Notes reviewed. She received IVF, ondansetron, Decadron, and morphine on 09/09/2018.  She was given an Rx for Norco 5-325 every 6 hours for intermittent abdominal pain.  On 09/20/2018, she was dizzy.  Magnesium was 1.5.  She received 1 liter NS, 8 mg ondansetron, and 4 gm of magnesium.  During the interim, patient notes continued issues with dizziness and "seeing white spots". This was improved with IVFs. Symptoms started Thursday after treatment. Diarrhea and vomiting persists, however she notes that she has improved overall. She remains on the following bowel regimen:  1. colestipol 1 pill each meal. 2. budesonide 3 mg 3 capsules daily 3. amitriptyline 50 mg at bedtime 4. Viberzi 100 mg twice daily  Patient denies that she has experienced any B symptoms. She denies any interval infections. Patient advises that she  maintains an adequate appetite. She is eating well. Weight today is 171 lb 4 oz (77.7 kg), which compared to her last visit to the clinic, represents a 2 pound increase.    Patient denies pain in the clinic today.   Past Medical History:  Diagnosis Date  . Abnormal cervical Papanicolaou smear 09/18/2017  . Anxiety   . Aortic atherosclerosis ([place])   . Arthritis    neck and knees  . 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 Blood transfusion without reported diagnosis   . Cervical cancer (HCC)    mets lung  . Chronic anal fissure   . Chronic diarrhea   . Dyspnea   . Erosive gastropathy 09/18/2017  . Factor V Leiden mutation ([place])   . Fecal incontinence   . Genital warts   . GERD (gastroesophageal reflux disease)   . Heart murmur   . Hemorrhoids   . 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 Hepatitis, chronic ([place]) 05/05/2017  . History of cancer chemotherapy    completed 06/2017  . History of Clostridium difficile infection    while undergoing chemo.  Negative test 10/2017  . Ileocolic anastomotic leak   . 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 Intestinal infection due to Clostridium difficile 09/18/2017  . Macrocytic anemia with vitamin B12 deficiency   . Nausea vomiting and diarrhea   . Pancolitis ([place]) 07/27/2018  . Perianal condylomata   .  Pneumonia    History of  . Pulmonary nodules   . Rectal bleeding   . Small bowel obstruction ([place]) 08/2017  . Stiff neck    limited right turn  . Vitamin D deficiency     Past Surgical History:  Procedure Laterality Date  . CHOLECYSTECTOMY    . COLON SURGERY  08/2017   resection  . COLONOSCOPY WITH PROPOFOL N/A 12/20/2017   Procedure: COLONOSCOPY WITH PROPOFOL;  Surgeon: [name], MD;  Location: [facility] ENDOSCOPY;  Service: Gastroenterology;  Laterality: N/A;  . COLONOSCOPY WITH PROPOFOL N/A 07/30/2018   Procedure: COLONOSCOPY WITH PROPOFOL;  Surgeon: [name], MD;  Location: [facility] ENDOSCOPY;  Service: Gastroenterology;  Laterality: N/A;  . DIAGNOSTIC LAPAROSCOPY    . ESOPHAGOGASTRODUODENOSCOPY (EGD) WITH PROPOFOL N/A 12/20/2017   Procedure: ESOPHAGOGASTRODUODENOSCOPY (EGD) WITH PROPOFOL;  Surgeon: [name], MD;  Location: [facility];  Service: Gastroenterology;  Laterality: N/A;  . ESOPHAGOGASTRODUODENOSCOPY (EGD) WITH PROPOFOL  07/30/2018   Procedure: ESOPHAGOGASTRODUODENOSCOPY (EGD) WITH PROPOFOL;  Surgeon: [name], MD;  Location: [facility] ENDOSCOPY;  Service: Gastroenterology;;  . LAPAROTOMY N/A 08/31/2017   Procedure: EXPLORATORY LAPAROTOMY for SBO, ileocolectomy, removal of piece of uterine wall;  Surgeon: [name], MD;  Location: [facility] ORS;  Service: General;  Laterality: N/A;  . LASER ABLATION CONDOLAMATA N/A 02/22/2018   Procedure: LASER ABLATION/REMOVAL OF RSWNIOEVOJJ AROUND ANUS AND VAGINA;  Surgeon: Michael Boston, MD;  Location: [facility];  Service: General;  Laterality: N/A;  . OOPHORECTOMY    . PORTA CATH INSERTION N/A 05/13/2018   Procedure: PORTA CATH INSERTION;  Surgeon: Katha Cabal, MD;  Location: [facility];  Service: Cardiovascular;  Laterality: N/A;  . SMALL INTESTINE SURGERY    . TANDEM RING INSERTION     x3  . THORACOTOMY    . TOTAL KNEE ARTHROPLASTY Left 04/24/2018   Procedure: TOTAL KNEE ARTHROPLASTY;  Surgeon: Lovell Sheehan, MD;  Location: [facility] ORS;  Service: Orthopedics;  Laterality: Left;    Family History  Problem Relation Age of Onset  . Hypertension Father   . Diabetes Father   . Alcohol abuse Daughter   . Hypertension Maternal Grandmother   . Diabetes Maternal Grandmother   . Diabetes Paternal Grandmother   . Hypertension Paternal Grandmother     Social History:  reports that she quit smoking about 11 years ago. She has never used smokeless tobacco. She reports that she drinks alcohol. She reports that she has current or past drug history. Drug:  Marijuana.  The patient's 74 year old daughter died of alcoholism.  The patient is originally from [place].  She moved from [place] to [place]. She is alone today.   Allergies:  Allergies  Allergen Reactions  . Ketamine Anxiety and Other (See Comments)    Syncope episode/confusion     Current Medications: Current Outpatient Medications  Medication Sig Dispense Refill  . amitriptyline (ELAVIL) 50 MG tablet Take 1 tablet (50 mg total) by mouth at bedtime. 30 tablet 2  . Calcium Carb-Cholecalciferol (CALCIUM 500 +D) 500-400 MG-UNIT TABS Take 2 tablets by mouth daily.    . colestipol (COLESTID) 1 g tablet Take 2 tablets (2 g total) by mouth 3 (three) times daily with meals. 180 tablet 0  . cyanocobalamin (,VITAMIN B-12,) 1000 MCG/ML injection Inject 1ML every week x 4 weeks, then 1ML every other week x 2 months, then monthly x 3 months (Patient taking differently: Inject 1,000 mcg into the muscle every  30 (thirty) days. Inject 1ML every week x 4 weeks, then 1ML every other week x 2 months, then monthly x 3 months) 10 mL 1  . Eluxadoline (VIBERZI) 100 MG TABS Take 1 tablet (100 mg total) by mouth 2 (two) times daily with a meal. 60 tablet 0  . HYDROcodone-acetaminophen (NORCO/VICODIN) 5-325 MG tablet Take 1 tablet by mouth every 6 (six) hours as needed for moderate pain. 60 tablet 0  . hydrocortisone (CORTEF) 5 MG tablet Take 1 tablet (5 mg total) by mouth 3 (three) times daily. 90 tablet 0  . Multiple Vitamins-Minerals (MULTIVITAMIN WITH MINERALS) tablet Take 1 tablet by mouth daily. 30 tablet 0  . OLANZapine zydis (ZYPREXA) 5 MG disintegrating tablet Take 1 tablet (5 mg total) by mouth at bedtime. 30 tablet 0  . ondansetron (ZOFRAN) 8 MG tablet Take 1 tablet (8 mg total) by mouth every 8 (eight) hours as needed for nausea or vomiting. 30 tablet 3  . pantoprazole (PROTONIX) 40 MG tablet Take 1 tablet (40 mg total) by mouth daily. 30 tablet 0  . promethazine (PHENERGAN) 25 MG  tablet Take 1 tablet (25 mg total) by mouth every 6 (six) hours as needed for nausea or vomiting. 30 tablet 1  . rivaroxaban (XARELTO) 20 MG TABS tablet Take 1 tablet (20 mg total) by mouth daily with supper. 30 tablet 3  . Syringe/Needle, Disp, (SYRINGE 3CC/22GX1") 22G X 1" 3 ML MISC For use with Vitamin B12 injections 50 each 0  . traZODone (DESYREL) 50 MG tablet Take 0.5-1 tablets (25-50 mg total) by mouth at bedtime as needed for sleep. 30 tablet 0  . VENTOLIN HFA 108 (90 Base) MCG/ACT inhaler Inhale 1-2 puffs into the lungs every 4 (four) hours as needed for shortness of breath.   0   No current facility-administered medications for this visit.    Facility-Administered Medications Ordered in Other Visits  Medication Dose Route Frequency Provider Last Rate Last Dose  . heparin lock flush 100 unit/mL  500 Units Intravenous Once Lequita Asal, MD         Review of Systems  Constitutional: Negative for diaphoresis, fever, malaise/fatigue and weight loss (up 2 pounds).       Feels dizzy.  HENT: Negative.  Negative for congestion, ear discharge, ear pain, nosebleeds, sore throat and tinnitus.   Eyes: Negative.  Negative for blurred vision, double vision, photophobia, pain, discharge and redness.  Respiratory: Positive for shortness of breath (exertional). Negative for cough, hemoptysis, sputum production and wheezing.   Cardiovascular: Negative.  Negative for chest pain, palpitations, orthopnea, leg swelling and PND.  Gastrointestinal: Positive for diarrhea (better), heartburn (on PPI) and nausea (controlled). Negative for abdominal pain, blood in stool, constipation, melena and vomiting.  Genitourinary: Negative.  Negative for dysuria, frequency, hematuria and urgency.  Musculoskeletal: Positive for joint pain (OA in shoulders). Negative for back pain, falls, myalgias and neck pain.  Skin: Negative for itching and rash.  Neurological: Positive for dizziness. Negative for tingling,  tremors, sensory change, speech change, focal weakness, weakness and headaches.  Endo/Heme/Allergies: Does not bruise/bleed easily.  Psychiatric/Behavioral: Negative for depression and memory loss. The patient is not nervous/anxious and does not have insomnia.   All other systems reviewed and are negative.  Performance status (ECOG): 1-2  Vital Signs BP 113/76 (BP Location: Left Arm, Patient Position: Sitting)   Pulse (!) 106   Temp (!) 97.2 F (36.2 C) (Tympanic)   Wt 171 lb 4 oz (77.7 kg)   BMI  26.82 kg/m   Physical Exam  Constitutional: She is oriented to person, place, and time and well-developed, well-nourished, and in no distress. No distress.  HENT:  Head: Normocephalic and atraumatic.  Mouth/Throat: Oropharynx is clear and moist and mucous membranes are normal. No oropharyngeal exudate.  Wearing a light gray wrap.  Eyes: Pupils are equal, round, and reactive to light. Conjunctivae and EOM are normal. No scleral icterus.  Blue eyes.  Neck: Normal range of motion. Neck supple. No JVD present.  Cardiovascular: Normal rate, regular rhythm, normal heart sounds and intact distal pulses. Exam reveals no gallop and no friction rub.  No murmur heard. Pulmonary/Chest: Effort normal and breath sounds normal. No respiratory distress. She has no wheezes. She has no rales.  Abdominal: Soft. Bowel sounds are normal. She exhibits no distension and no mass. There is generalized abdominal tenderness (minimal). There is no rebound and no guarding.  Musculoskeletal: Normal range of motion.        General: No edema.  Lymphadenopathy:    She has no cervical adenopathy.    She has no axillary adenopathy.       Right: No supraclavicular adenopathy present.       Left: No supraclavicular adenopathy present.  Neurological: She is alert and oriented to person, place, and time. Gait normal.  Skin: Skin is warm and dry. No rash noted. She is not diaphoretic. No erythema.  Psychiatric: Mood, affect  and judgment normal.  Nursing note and vitals reviewed.   Imaging studies: 06/2017:  PET scan revealed enlarging pulmonary nodules but no evidence of abdominal disease per patient report. 09/13/2017:  Abdomen and pelvic CT revealed a small infarct in the inferior pole of the LEFT kidney and RIGHT uterine infarct. 11/08/2017:  PET scan at [facility] revealed innumerable stable bilateral cavitary pulmonary nodules (up to 8 mm), some of which demonstrate low level metabolic activity.  There were post treatment changes in the pelvis without evidence of suspicious hypermetabolic activity. 12/03/2017:  Abdomen and pelvic CT revealed continued multiple pulmonary nodules in the lung bases concerning for metastatic disease.  There was mild wall thickening of rectosigmoid colon is noted.  There was a stable 1.8 x 1.6 cm mixed fat and soft tissue density noted in right lower quadrant which may represent fat necrosis.  There was a larger low density is noted involving uterine fundus.  12/03/2017:  Pelvic MRI was unremarkable.   04/15/2018:  Chest, abdomen, and pelvis CT revealed innumerable (> 100) cavitary nodules scattered in the lungs, moderately enlarging compared to the 11/08/2017 PET-CT, suspicious for metastatic disease.  One index node in the RLL measures 1.0 x 1.1 cm (previously 0.6 x 0.6 cm).  There were no new nodules.  There was an ill-defined wall thickening in the rectosigmoid with surrounding stranding along fascia planes, probably sequela from prior radiation therapy.  There was multilevel lumbar impingement due to spondylosis and degenerative disc disease.  There was heterogeneous enhancement in the uterus (some possibly from prior radiation therapy). 04/23/2018:  PET scan revealed numerous scattered solid and cavitary nodules in the lungs stable increased in size compared to the prior PET-CT from 11/08/2017. Largest nodule was 1.1 cm in the LUL (SUV 1.9).  These  demonstrated low-grade metabolic activity up to a maximum SUV of about 2.3, increased from 11/08/2017.    Appointment on 09/24/2018  Component Date Value Ref Range Status  . Magnesium 09/24/2018 1.6* 1.7 - 2.4 mg/dL Final   Performed at [facility]  [place], [address], [place], [place] [address]  . Sodium 09/24/2018 135  135 - 145 mmol/L Final  . Potassium 09/24/2018 3.9  3.5 - 5.1 mmol/L Final  . Chloride 09/24/2018 102  98 - 111 mmol/L Final  . CO2 09/24/2018 26  22 - 32 mmol/L Final  . Glucose, Bld 09/24/2018 94  70 - 99 mg/dL Final  . BUN 09/24/2018 14  6 - 20 mg/dL Final  . Creatinine, Ser 09/24/2018 0.76  0.44 - 1.00 mg/dL Final  . Calcium 09/24/2018 9.4  8.9 - 10.3 mg/dL Final  . Total Protein 09/24/2018 7.2  6.5 - 8.1 g/dL Final  . Albumin 09/24/2018 3.9  3.5 - 5.0 g/dL Final  . AST 09/24/2018 30  15 - 41 U/L Final  . ALT 09/24/2018 39  0 - 44 U/L Final  . Alkaline Phosphatase 09/24/2018 141* 38 - 126 U/L Final  . Total Bilirubin 09/24/2018 0.6  0.3 - 1.2 mg/dL Final  . GFR calc non Af Amer 09/24/2018 >60  >60 mL/min Final  . GFR calc Af Amer 09/24/2018 >60  >60 mL/min Final  . Anion gap 09/24/2018 7  5 - 15 Final   Performed at [facility], [address], [place], [place] [address]  . WBC 09/24/2018 3.9* 4.0 - 10.5 K/uL Final  . RBC 09/24/2018 2.94* 3.87 - 5.11 MIL/uL Final  . Hemoglobin 09/24/2018 10.1* 12.0 - 15.0 g/dL Final  . HCT 09/24/2018 31.3* 36.0 - 46.0 % Final  . MCV 09/24/2018 106.5* 80.0 - 100.0 fL Final  . MCH 09/24/2018 34.4* 26.0 - 34.0 pg Final  . MCHC 09/24/2018 32.3  30.0 - 36.0 g/dL Final  . RDW 09/24/2018 14.1  11.5 - 15.5 % Final  . Platelets 09/24/2018 130* 150 - 400 K/uL Final  . nRBC 09/24/2018 0.0  0.0 - 0.2 % Final  . Neutrophils Relative % 09/24/2018 77  % Final  . Neutro Abs 09/24/2018 3.0  1.7 - 7.7 K/uL Final  . Lymphocytes Relative 09/24/2018 18  % Final  . Lymphs Abs 09/24/2018 0.7  0.7 - 4.0 K/uL Final  . Monocytes Relative  09/24/2018 5  % Final  . Monocytes Absolute 09/24/2018 0.2  0.1 - 1.0 K/uL Final  . Eosinophils Relative 09/24/2018 0  % Final  . Eosinophils Absolute 09/24/2018 0.0  0.0 - 0.5 K/uL Final  . Basophils Relative 09/24/2018 0  % Final  . Basophils Absolute 09/24/2018 0.0  0.0 - 0.1 K/uL Final  . Immature Granulocytes 09/24/2018 0  % Final  . Abs Immature Granulocytes 09/24/2018 0.01  0.00 - 0.07 K/uL Final   Performed at [facility], [address] [place], [place] [address]    Assessment:  NEETI KNUDTSON is a 52 y.o. female with a history of stage IB1 cervical cancer s/p concurrent cisplatin and radiation (external beam and brachytherapy) from 11/2016 - 02/2017.    She was treated by Dr. Christene Slates at [facility] in [place], [place].  In 01/2017, she received cisplatin x 2 and carboplatin x 1 (01/29/2017) due to ARF and XRT. Radiation was followed by T & O on 02/01/2017 and T & N on 02/10/2017 and 02/20/2017.  Course was complicated by weight loss (80 pounds), nausea, vomiting, electrolyte wasting (potassium and magnesium), and multiple hospitalizations.  PET scan in 06/2017 revealed enlarging pulmonary nodules but no evidence of abdominal disease per patient report.  She is s/p right ileocolectomy on 08/31/2017 for a small bowel  obstruction.  She presented with abdominal pain.  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 PDL-1 testing reveals a CPS score of 15.  Chest, abdomen, and pelvis CT on 04/15/2018 revealed innumerable (> 100) cavitary nodules scattered in the lungs, moderately enlarging compared to the 11/08/2017 PET-CT, suspicious for metastatic disease. One index node in the RLL measures 1.0 x 1.1 cm.  There were no new nodules.  There was an ill-defined wall thickening in the rectosigmoid with surrounding  stranding along fascia planes, probably sequela from prior radiation therapy.  There was multilevel lumbar impingement due to spondylosis and degenerative disc disease.  There was heterogeneous enhancement in the uterus (some possibly from prior radiation therapy).  PET scan on 04/23/2018 revealed numerous scattered solid and cavitary nodules in the lungs stable increased in size compared to the prior PET-CT from 11/08/2017. Largest nodule was 1.1 cm in the LUL (SUV 1.9).  These demonstrated low-grade metabolic activity up to a maximum SUV of about 2.3, increased from 11/08/2017.   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  She s/p 4 cycles of carboplatin and Taxol (06/04/2018 - 09/03/2018) with Margarette [place] support.    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  Testing was negative for prothrombin gene mutation, lupus anticoagulant panel, anticardiolipin antibodies, and beta-2 glycoprotein antibodies.  She was discharged on Xarelto.  She was admitted to [facility] from 12/03/2017 - 12/08/2017 with abdominal pain. She was not felt to have a uterine infarct.  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  The etiology was unclear.  She is hepatitis C positive.   Hepatitis C genotype is 2a/2c.  She has B12 deficiency.  B12 was 165 on 11/27/2017.  She began B12 injections on 11/29/2017.  B12 injections occur monthly at home.  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  She was diagnosed with colitis.  Abdomen and pelvic CT on 05/23/2018 revealed colonic wall thickening, consistent with colitis. Considerations include inflammatory or infectious causes. Ischemic causes would be less likely. She is s/p RIGHT hemicolectomy. She had a patent ileocolic anastomosis.  She received a 10 day course of ciprofloxacin and Flagyl.   Abdomen and pelvic CT angiogram on 09/07/2018 revealed colitis, mainly affecting lower loops, question radiation colitis if there has been radiotherapy of the patient's cervical cancer.  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   She has  chronic diarrhea s/p right hemicolectomy.  She is on colestipol, budesonide, amitriptyline, and Viberzi.  She has adrenal insufficiency.  She is on hydrocortisone.  Symptomatically, she again had difficulty after chemotherapy.  She has had nausea, vomiting, diarrhea, and dizziness.  Exam is stable.  Plan: 1. Labs today:  CBC with diff, CMP, Mg. 2. Cervical cancer: Poor ongoing tolerance of chemotherapy despite change in bowel regimen. Discuss restaging studies to assess benefit of chemotherapy. Hold cycle #5 carboplatin and Taxol today. 3. Transaminitis:  Resolved.  Alkaline phosphatase 141 likely secondary to Neulasta. 4. Adrenal insufficiency: Continue hydrocortisone. Follow-up with endocrinology. 5. Nausea, vomiting, and diarrhea:  Recurrent episodes with chemotherapy.  Discuss symptom management.  She has antiemetics and pain medications at home to use on a prn bases.  Interventions are adequate.    6. Dehydration and electrolyte derangements: Magnesium 1.6. Magnesium 2 gm IV. 7. B12 deficiency: Continue  monthly B12 at home. 8. Chest CT: restaging. 9. RTC in 1 week for MD assessment, labs (CBC with diff, CMP, Mg), review chest CT, and +/- cycle #5 paclitaxel + carboplatin with Neulasta support   Honor Loh, NP  09/24/2018, 10:21 AM   I saw and evaluated the patient, participating in the key portions of the service and reviewing pertinent diagnostic studies and records.  I reviewed the nurse practitioner's note and agree with the findings and the plan.  The assessment and plan were discussed with the patient.  Multiple questions were asked by the patient and answered.   Nolon Stalls, MD 09/24/2018,10:21 AM

## 2018-09-25 ENCOUNTER — Encounter: Payer: Self-pay | Admitting: Hematology and Oncology

## 2018-09-25 ENCOUNTER — Inpatient Hospital Stay: Payer: Medicaid Other

## 2018-09-29 ENCOUNTER — Encounter: Payer: Self-pay | Admitting: Hematology and Oncology

## 2018-09-30 ENCOUNTER — Inpatient Hospital Stay: Payer: Medicaid Other

## 2018-09-30 ENCOUNTER — Ambulatory Visit
Admission: RE | Admit: 2018-09-30 | Discharge: 2018-09-30 | Disposition: A | Discharge status: Home / Self Care | Payer: Medicaid Other | Source: Ambulatory Visit | Attending: Urgent Care | Admitting: Urgent Care

## 2018-09-30 ENCOUNTER — Encounter: Payer: Self-pay | Admitting: Hematology and Oncology

## 2018-09-30 ENCOUNTER — Encounter: Payer: Self-pay | Admitting: Urgent Care

## 2018-09-30 ENCOUNTER — Ambulatory Visit: Payer: Medicaid Other | Admitting: Gastroenterology

## 2018-09-30 ENCOUNTER — Inpatient Hospital Stay (HOSPITAL_BASED_OUTPATIENT_CLINIC_OR_DEPARTMENT_OTHER): Payer: Medicaid Other | Admitting: Hematology and Oncology

## 2018-09-30 VITALS — BP 121/82 | HR 72 | Temp 96.8°F | Resp 18 | Wt 170.8 lb

## 2018-09-30 DIAGNOSIS — Z923 Personal history of irradiation: Secondary | ICD-10-CM

## 2018-09-30 DIAGNOSIS — C538 Malignant neoplasm of overlapping sites of cervix uteri: Secondary | ICD-10-CM

## 2018-09-30 DIAGNOSIS — Z79899 Other long term (current) drug therapy: Secondary | ICD-10-CM

## 2018-09-30 DIAGNOSIS — Z87891 Personal history of nicotine dependence: Secondary | ICD-10-CM

## 2018-09-30 DIAGNOSIS — D6851 Activated protein C resistance: Secondary | ICD-10-CM | POA: Diagnosis not present

## 2018-09-30 DIAGNOSIS — Z5111 Encounter for antineoplastic chemotherapy: Secondary | ICD-10-CM

## 2018-09-30 DIAGNOSIS — E538 Deficiency of other specified B group vitamins: Secondary | ICD-10-CM

## 2018-09-30 DIAGNOSIS — R748 Abnormal levels of other serum enzymes: Secondary | ICD-10-CM

## 2018-09-30 DIAGNOSIS — R197 Diarrhea, unspecified: Secondary | ICD-10-CM

## 2018-09-30 DIAGNOSIS — C7800 Secondary malignant neoplasm of unspecified lung: Secondary | ICD-10-CM

## 2018-09-30 DIAGNOSIS — E860 Dehydration: Secondary | ICD-10-CM | POA: Diagnosis not present

## 2018-09-30 DIAGNOSIS — E2740 Unspecified adrenocortical insufficiency: Secondary | ICD-10-CM

## 2018-09-30 LAB — COMPREHENSIVE METABOLIC PANEL
ALT: 33 U/L (ref 0–44)
AST: 29 U/L (ref 15–41)
Albumin: 3.8 g/dL (ref 3.5–5.0)
Alkaline Phosphatase: 138 U/L — ABNORMAL HIGH (ref 38–126)
Anion gap: 7 (ref 5–15)
BUN: 13 mg/dL (ref 6–20)
CO2: 26 mmol/L (ref 22–32)
Calcium: 9.1 mg/dL (ref 8.9–10.3)
Chloride: 102 mmol/L (ref 98–111)
Creatinine, Ser: 0.79 mg/dL (ref 0.44–1.00)
GFR calc Af Amer: >60 mL/min (ref >60)
GFR calc non Af Amer: >60 mL/min (ref >60)
Glucose, Bld: 84 mg/dL (ref 70–99)
Potassium: 3.9 mmol/L (ref 3.5–5.1)
Sodium: 135 mmol/L (ref 135–145)
Total Bilirubin: 0.5 mg/dL (ref 0.3–1.2)
Total Protein: 7.4 g/dL (ref 6.5–8.1)

## 2018-09-30 LAB — CBC WITH DIFFERENTIAL/PLATELET
Abs Immature Granulocytes: 0 10*3/uL (ref 0.00–0.07)
Basophils Absolute: 0 10*3/uL (ref 0.0–0.1)
Basophils Relative: 0 %
Eosinophils Absolute: 0 10*3/uL (ref 0.0–0.5)
Eosinophils Relative: 1 %
HCT: 31.3 % — ABNORMAL LOW (ref 36.0–46.0)
Hemoglobin: 10.2 g/dL — ABNORMAL LOW (ref 12.0–15.0)
Immature Granulocytes: 0 %
Lymphocytes Relative: 29 %
Lymphs Abs: 0.8 10*3/uL (ref 0.7–4.0)
MCH: 34.3 pg — ABNORMAL HIGH (ref 26.0–34.0)
MCHC: 32.6 g/dL (ref 30.0–36.0)
MCV: 105.4 fL — ABNORMAL HIGH (ref 80.0–100.0)
Monocytes Absolute: 0.2 10*3/uL (ref 0.1–1.0)
Monocytes Relative: 9 %
Neutro Abs: 1.7 10*3/uL (ref 1.7–7.7)
Neutrophils Relative %: 61 %
Platelets: 146 10*3/uL — ABNORMAL LOW (ref 150–400)
RBC: 2.97 MIL/uL — ABNORMAL LOW (ref 3.87–5.11)
RDW: 13.4 % (ref 11.5–15.5)
WBC: 2.8 10*3/uL — ABNORMAL LOW (ref 4.0–10.5)
nRBC: 0 % (ref 0.0–0.2)

## 2018-09-30 LAB — MAGNESIUM: Magnesium: 1.5 mg/dL — ABNORMAL LOW (ref 1.7–2.4)

## 2018-09-30 MED ORDER — DIPHENHYDRAMINE HCL 50 MG/ML IJ SOLN
50.0000 mg | Freq: Once | INTRAMUSCULAR | Status: AC
  Administered 2018-09-30: 50 mg via INTRAVENOUS
  Filled 2018-09-30: qty 1

## 2018-09-30 MED ORDER — HEPARIN SOD (PORK) LOCK FLUSH 100 UNIT/ML IV SOLN
500.0000 [IU] | Freq: Once | INTRAVENOUS | Status: AC | PRN
  Administered 2018-09-30: 500 [IU]

## 2018-09-30 MED ORDER — IOPAMIDOL (ISOVUE-300) INJECTION 61%
75.0000 mL | Freq: Once | INTRAVENOUS | Status: AC | PRN
  Administered 2018-09-30: 75 mL via INTRAVENOUS

## 2018-09-30 MED ORDER — SODIUM CHLORIDE 0.9% FLUSH
10.0000 mL | Freq: Once | INTRAVENOUS | Status: AC
  Administered 2018-09-30: 10 mL via INTRAVENOUS
  Filled 2018-09-30: qty 10

## 2018-09-30 MED ORDER — MAGNESIUM SULFATE 4 GM/100ML IV SOLN
4.0000 g | Freq: Once | INTRAVENOUS | Status: AC
  Administered 2018-09-30: 4 g via INTRAVENOUS
  Filled 2018-09-30: qty 100

## 2018-09-30 MED ORDER — FAMOTIDINE IN NACL 20-0.9 MG/50ML-% IV SOLN
20.0000 mg | Freq: Once | INTRAVENOUS | Status: AC
  Administered 2018-09-30: 20 mg via INTRAVENOUS
  Filled 2018-09-30: qty 50

## 2018-09-30 MED ORDER — HEPARIN SOD (PORK) LOCK FLUSH 100 UNIT/ML IV SOLN
500.0000 [IU] | Freq: Once | INTRAVENOUS | Status: DC
  Filled 2018-09-30: qty 5

## 2018-09-30 MED ORDER — SODIUM CHLORIDE 0.9 % IV SOLN
500.0000 mg | Freq: Once | INTRAVENOUS | Status: AC
  Administered 2018-09-30: 500 mg via INTRAVENOUS
  Filled 2018-09-30: qty 50

## 2018-09-30 MED ORDER — SODIUM CHLORIDE 0.9 % IV SOLN
155.0000 mg/m2 | Freq: Once | INTRAVENOUS | Status: AC
  Administered 2018-09-30: 294 mg via INTRAVENOUS
  Filled 2018-09-30: qty 49

## 2018-09-30 MED ORDER — SODIUM CHLORIDE 0.9 % IV SOLN
20.0000 mg | Freq: Once | INTRAVENOUS | Status: AC
  Administered 2018-09-30: 20 mg via INTRAVENOUS
  Filled 2018-09-30: qty 2

## 2018-09-30 MED ORDER — SODIUM CHLORIDE 0.9 % IV SOLN
Freq: Once | INTRAVENOUS | Status: AC
  Administered 2018-09-30: 11:00:00 via INTRAVENOUS
  Filled 2018-09-30: qty 250

## 2018-09-30 MED ORDER — PALONOSETRON HCL INJECTION 0.25 MG/5ML
0.2500 mg | Freq: Once | INTRAVENOUS | Status: AC
  Administered 2018-09-30: 0.25 mg via INTRAVENOUS
  Filled 2018-09-30: qty 5

## 2018-09-30 NOTE — Progress Notes (Signed)
Pt in for follow up, denies any difficulties or concerns today.  Reports "just came from having an MRI'.

## 2018-09-30 NOTE — Progress Notes (Signed)
[facility] day:  09/30/2018   Chief Complaint: Joanna Hall is a 52 y.o. female with stage IV cervical cancer who is seen for review of interval chest CT and assessment prior to cycle #5 carboplatin and Taxol.  HPI:  The patient was last seen in the medical oncology clinic on 09/24/2018.  At that time, she continued to struggle with chemotherapy.  She had nausea, vomiting, diarrhea, and dizziness.  Exam was stable.  Decision was made to postpone chemotherapy and assess current response to treatment.  Chest CT on 09/30/2018 revealed interval decrease in size and number of metastatic pulmonary nodules.  There were no new/progressive findings in the chest or upper abdomen  During the interim, patient is doing well overall. She notes that her diarrhea has improved. She states, "I was taking 9 pills a day, but now I am down to 3". Nausea persists mainly when she eats, however she advises that it is controlled with the prescribed interventions. Patient denies that she has experienced any B symptoms. She denies any interval infections.   Patient advises that she maintains an adequate appetite. She is eating well. Weight today is 160 lb 5 oz (72.7 kg), which compared to her last visit to the clinic, represents a 1 pound decrease.   Patient denies pain in the clinic today.   Past Medical History:  Diagnosis Date  . Abnormal cervical Papanicolaou smear 09/18/2017  . Anxiety   . Aortic atherosclerosis ([place])   . Arthritis    neck and knees  . 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 Blood transfusion without reported diagnosis   . Cervical cancer (HCC)    mets lung  . Chronic anal fissure   . Chronic diarrhea   . Dyspnea   . Erosive gastropathy 09/18/2017  . Factor V Leiden mutation ([place])   . Fecal incontinence   . Genital warts   . GERD (gastroesophageal reflux disease)   . Heart murmur   . Hemorrhoids   . 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 Hepatitis, chronic ([place]) 05/05/2017  . History of cancer chemotherapy    completed 06/2017  . History of Clostridium difficile infection    while undergoing chemo.  Negative test 10/2017  . Ileocolic anastomotic leak   . 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 Intestinal infection due to Clostridium difficile 09/18/2017  . Macrocytic anemia with vitamin B12 deficiency   . Nausea vomiting and diarrhea   . Pancolitis ([place]) 07/27/2018  . Perianal condylomata   . Pneumonia    History of  . Pulmonary nodules   . Rectal bleeding   . Small bowel obstruction ([place]) 08/2017  . Stiff neck    limited right turn  . Vitamin D deficiency     Past Surgical History:  Procedure Laterality Date  . CHOLECYSTECTOMY    . COLON SURGERY  08/2017   resection  . COLONOSCOPY WITH PROPOFOL N/A 12/20/2017   Procedure: COLONOSCOPY WITH PROPOFOL;  Surgeon: [name], MD;  Location: [facility] ENDOSCOPY;  Service: Gastroenterology;  Laterality: N/A;  . COLONOSCOPY WITH PROPOFOL N/A 07/30/2018   Procedure: COLONOSCOPY WITH PROPOFOL;  Surgeon: [name], MD;  Location: [facility] ENDOSCOPY;  Service: Gastroenterology;  Laterality: N/A;  . DIAGNOSTIC LAPAROSCOPY    . ESOPHAGOGASTRODUODENOSCOPY (EGD) WITH PROPOFOL N/A 12/20/2017   Procedure: ESOPHAGOGASTRODUODENOSCOPY (EGD)  WITH PROPOFOL;  Surgeon: [name], MD;  Location: [facility] ENDOSCOPY;  Service: Gastroenterology;  Laterality: N/A;  . ESOPHAGOGASTRODUODENOSCOPY (EGD) WITH PROPOFOL  07/30/2018   Procedure: ESOPHAGOGASTRODUODENOSCOPY (EGD) WITH PROPOFOL;  Surgeon: [name], MD;  Location: [facility] ENDOSCOPY;  Service: Gastroenterology;;  . LAPAROTOMY N/A 08/31/2017   Procedure: EXPLORATORY LAPAROTOMY for SBO, ileocolectomy, removal of piece of uterine wall;  Surgeon: [name], MD;  Location: [facility] ORS;  Service: General;  Laterality: N/A;  . LASER ABLATION CONDOLAMATA N/A  02/22/2018   Procedure: LASER ABLATION/REMOVAL OF BEEFEOFHQRF AROUND ANUS AND VAGINA;  Surgeon: Michael Boston, MD;  Location: [place];  Service: General;  Laterality: N/A;  . OOPHORECTOMY    . PORTA CATH INSERTION N/A 05/13/2018   Procedure: PORTA CATH INSERTION;  Surgeon: Katha Cabal, MD;  Location: [place] CV LAB;  Service: Cardiovascular;  Laterality: N/A;  . SMALL INTESTINE SURGERY    . TANDEM RING INSERTION     x3  . THORACOTOMY    . TOTAL KNEE ARTHROPLASTY Left 04/24/2018   Procedure: TOTAL KNEE ARTHROPLASTY;  Surgeon: Lovell Sheehan, MD;  Location: [facility] ORS;  Service: Orthopedics;  Laterality: Left;    Family History  Problem Relation Age of Onset  . Hypertension Father   . Diabetes Father   . Alcohol abuse Daughter   . Hypertension Maternal Grandmother   . Diabetes Maternal Grandmother   . Diabetes Paternal Grandmother   . Hypertension Paternal Grandmother     Social History:  reports that she quit smoking about 11 years ago. She has never used smokeless tobacco. She reports current alcohol use. She reports current drug use. Drug: Marijuana.  The patient's 40 year old daughter died of alcoholism.  The patient is originally from [place].  She moved from [place] to [place]. She is alone today.   Allergies:  Allergies  Allergen Reactions  . Ketamine Anxiety and Other (See Comments)    Syncope episode/confusion     Current Medications: Current Outpatient Medications  Medication Sig Dispense Refill  . amitriptyline (ELAVIL) 50 MG tablet Take 1 tablet (50 mg total) by mouth at bedtime. 30 tablet 2  . Calcium Carb-Cholecalciferol (CALCIUM 500 +D) 500-400 MG-UNIT TABS Take 2 tablets by mouth daily.    . colestipol (COLESTID) 1 g tablet Take 2 tablets (2 g total) by mouth 3 (three) times daily with meals. 180 tablet 0  . cyanocobalamin (,VITAMIN B-12,) 1000 MCG/ML injection Inject 1ML every week x 4 weeks, then 1ML every other  week x 2 months, then monthly x 3 months (Patient taking differently: Inject 1,000 mcg into the muscle every 30 (thirty) days. Inject 1ML every week x 4 weeks, then 1ML every other week x 2 months, then monthly x 3 months) 10 mL 1  . Eluxadoline (VIBERZI) 100 MG TABS Take 1 tablet (100 mg total) by mouth 2 (two) times daily with a meal. 60 tablet 0  . HYDROcodone-acetaminophen (NORCO/VICODIN) 5-325 MG tablet Take 1 tablet by mouth every 6 (six) hours as needed for moderate pain. 60 tablet 0  . Multiple Vitamins-Minerals (MULTIVITAMIN WITH MINERALS) tablet Take 1 tablet by mouth daily. 30 tablet 0  . OLANZapine zydis (ZYPREXA) 5 MG disintegrating tablet Take 1 tablet (5 mg total) by mouth at bedtime. 30 tablet 0  . ondansetron (ZOFRAN) 8 MG tablet Take 1 tablet (8 mg total) by mouth every 8 (eight) hours as needed for nausea or vomiting. 30 tablet 3  . pantoprazole (PROTONIX)  40 MG tablet Take 1 tablet (40 mg total) by mouth daily. 30 tablet 0  . promethazine (PHENERGAN) 25 MG tablet Take 1 tablet (25 mg total) by mouth every 6 (six) hours as needed for nausea or vomiting. 30 tablet 1  . rivaroxaban (XARELTO) 20 MG TABS tablet Take 1 tablet (20 mg total) by mouth daily with supper. 30 tablet 3  . Syringe/Needle, Disp, (SYRINGE 3CC/22GX1") 22G X 1" 3 ML MISC For use with Vitamin B12 injections 50 each 0  . traZODone (DESYREL) 50 MG tablet Take 0.5-1 tablets (25-50 mg total) by mouth at bedtime as needed for sleep. 30 tablet 0  . VENTOLIN HFA 108 (90 Base) MCG/ACT inhaler Inhale 1-2 puffs into the lungs every 4 (four) hours as needed for shortness of breath.   0   No current facility-administered medications for this visit.    Facility-Administered Medications Ordered in Other Visits  Medication Dose Route Frequency Provider Last Rate Last Dose  . heparin lock flush 100 unit/mL  500 Units Intravenous Once Corcoran, Melissa C, MD      . heparin lock flush 100 unit/mL  500 Units Intravenous Once  Lequita Asal, MD         Review of Systems  Constitutional: Positive for weight loss (1 pound). Negative for chills, diaphoresis, fever and malaise/fatigue.       Feels "ok".  HENT: Negative.  Negative for congestion, ear discharge, ear pain, hearing loss, nosebleeds, sinus pain, sore throat and tinnitus.   Eyes: Negative.  Negative for blurred vision, double vision, photophobia, pain, discharge and redness.  Respiratory: Positive for shortness of breath (exertional). Negative for cough, hemoptysis, sputum production and wheezing.   Cardiovascular: Negative.  Negative for chest pain, palpitations, orthopnea, leg swelling and PND.  Gastrointestinal: Positive for diarrhea (minimal), heartburn (chronic on PPI) and nausea (well controlled). Negative for blood in stool, constipation, melena and vomiting.  Genitourinary: Negative.  Negative for dysuria, frequency, hematuria and urgency.  Musculoskeletal: Positive for joint pain (OA in shoulders). Negative for back pain, myalgias and neck pain.  Skin: Negative.  Negative for itching and rash.  Neurological: Negative for dizziness, tingling, tremors, sensory change, speech change, focal weakness, weakness and headaches.  Psychiatric/Behavioral: Negative for depression and memory loss. The patient is not nervous/anxious and does not have insomnia.   All other systems reviewed and are negative.  Performance status (ECOG): 1  Vital Signs BP 121/82 (BP Location: Left Arm, Patient Position: Sitting)   Pulse 72   Temp (!) 96.8 F (36 C) (Tympanic)   Resp 18   Wt 160 lb 5 oz (72.7 kg)   BMI 25.11 kg/m   Physical Exam  Constitutional: She is oriented to person, place, and time and well-developed, well-nourished, and in no distress. No distress.  HENT:  Head: Normocephalic and atraumatic.  Mouth/Throat: Oropharynx is clear and moist and mucous membranes are normal. No oropharyngeal exudate.  Wearing a pink wrap.  Eyes: Pupils are equal,  round, and reactive to light. Conjunctivae and EOM are normal. Right eye exhibits no discharge. Left eye exhibits no discharge. No scleral icterus.  Blue eyes.  Neck: Normal range of motion. Neck supple. No JVD present.  Cardiovascular: Normal rate, regular rhythm, normal heart sounds and intact distal pulses. Exam reveals no gallop and no friction rub.  No murmur heard. Pulmonary/Chest: Effort normal and breath sounds normal. No respiratory distress. She has no wheezes. She has no rales.  Abdominal: Soft. Bowel sounds are normal. She exhibits  no distension. There is generalized abdominal tenderness (slight- baseline). There is no rebound and no guarding.  Musculoskeletal: Normal range of motion.        General: No tenderness or edema.  Lymphadenopathy:    She has no cervical adenopathy.    She has no axillary adenopathy.       Right: No supraclavicular adenopathy present.       Left: No supraclavicular adenopathy present.  Neurological: She is oriented to person, place, and time.  Skin: Skin is warm and dry. No rash noted. She is not diaphoretic. No erythema. No pallor.  Psychiatric: Mood, affect and judgment normal.  Nursing note and vitals reviewed.   Imaging studies: 06/2017:  PET scan revealed enlarging pulmonary nodules but no evidence of abdominal disease per patient report. 09/13/2017:  Abdomen and pelvic CT revealed a small infarct in the inferior pole of the LEFT kidney and RIGHT uterine infarct. 11/08/2017:  PET scan at the [place] [facility] revealed innumerable stable bilateral cavitary pulmonary nodules (up to 8 mm), some of which demonstrate low level metabolic activity.  There were post treatment changes in the pelvis without evidence of suspicious hypermetabolic activity. 12/03/2017:  Abdomen and pelvic CT revealed continued multiple pulmonary nodules in the lung bases concerning for metastatic disease.  There was mild wall thickening of rectosigmoid colon  is noted.  There was a stable 1.8 x 1.6 cm mixed fat and soft tissue density noted in right lower quadrant which may represent fat necrosis.  There was a larger low density is noted involving uterine fundus.  12/03/2017:  Pelvic MRI was unremarkable.   04/15/2018:  Chest, abdomen, and pelvis CT revealed innumerable (> 100) cavitary nodules scattered in the lungs, moderately enlarging compared to the 11/08/2017 PET-CT, suspicious for metastatic disease.  One index node in the RLL measures 1.0 x 1.1 cm (previously 0.6 x 0.6 cm).  There were no new nodules.  There was an ill-defined wall thickening in the rectosigmoid with surrounding stranding along fascia planes, probably sequela from prior radiation therapy.  There was multilevel lumbar impingement due to spondylosis and degenerative disc disease.  There was heterogeneous enhancement in the uterus (some possibly from prior radiation therapy). 04/23/2018:  PET scan revealed numerous scattered solid and cavitary nodules in the lungs stable increased in size compared to the prior PET-CT from 11/08/2017. Largest nodule was 1.1 cm in the LUL (SUV 1.9).  These demonstrated low-grade metabolic activity up to a maximum SUV of about 2.3, increased from 11/08/2017.  05/23/2018:  Abdomen and pelvic CT revealed colonic wall thickening, consistent with colitis. Considerations include inflammatory or infectious causes. Ischemic causes would be less likely. She is s/p RIGHT hemicolectomy. She had a patent ileocolic anastomosis.  She received a 10 day course of ciprofloxacin and Flagyl.   09/07/2018:  Abdomen and pelvic CT angiogram revealed colitis, mainly affecting lower loops, question radiation colitis if there has been radiotherapy of the patient's cervical cancer.  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  09/30/2018:  Chest CT revealed interval decrease in size and number of metastatic pulmonary nodules.  There were no new/progressive findings in the chest or upper  abdomen   Infusion on 09/30/2018  Component Date Value Ref Range Status  . Magnesium 09/30/2018 1.5* 1.7 - 2.4 mg/dL Final   Performed at [facility], [address] [place], [place] [address]  . Sodium 09/30/2018 135  135 - 145 mmol/L Final  . Potassium 09/30/2018 3.9  3.5 - 5.1 mmol/L  Final  . Chloride 09/30/2018 102  98 - 111 mmol/L Final  . CO2 09/30/2018 26  22 - 32 mmol/L Final  . Glucose, Bld 09/30/2018 84  70 - 99 mg/dL Final  . BUN 09/30/2018 13  6 - 20 mg/dL Final  . Creatinine, Ser 09/30/2018 0.79  0.44 - 1.00 mg/dL Final  . Calcium 09/30/2018 9.1  8.9 - 10.3 mg/dL Final  . Total Protein 09/30/2018 7.4  6.5 - 8.1 g/dL Final  . Albumin 09/30/2018 3.8  3.5 - 5.0 g/dL Final  . AST 09/30/2018 29  15 - 41 U/L Final  . ALT 09/30/2018 33  0 - 44 U/L Final  . Alkaline Phosphatase 09/30/2018 138* 38 - 126 U/L Final  . Total Bilirubin 09/30/2018 0.5  0.3 - 1.2 mg/dL Final  . GFR calc non Af Amer 09/30/2018 >60  >60 mL/min Final  . GFR calc Af Amer 09/30/2018 >60  >60 mL/min Final  . Anion gap 09/30/2018 7  5 - 15 Final   Performed at [facility], [address] [place], [place] [address]  . WBC 09/30/2018 2.8* 4.0 - 10.5 K/uL Final  . RBC 09/30/2018 2.97* 3.87 - 5.11 MIL/uL Final  . Hemoglobin 09/30/2018 10.2* 12.0 - 15.0 g/dL Final  . HCT 09/30/2018 31.3* 36.0 - 46.0 % Final  . MCV 09/30/2018 105.4* 80.0 - 100.0 fL Final  . MCH 09/30/2018 34.3* 26.0 - 34.0 pg Final  . MCHC 09/30/2018 32.6  30.0 - 36.0 g/dL Final  . RDW 09/30/2018 13.4  11.5 - 15.5 % Final  . Platelets 09/30/2018 146* 150 - 400 K/uL Final  . nRBC 09/30/2018 0.0  0.0 - 0.2 % Final  . Neutrophils Relative % 09/30/2018 61  % Final  . Neutro Abs 09/30/2018 1.7  1.7 - 7.7 K/uL Final  . Lymphocytes Relative 09/30/2018 29  % Final  . Lymphs Abs 09/30/2018 0.8  0.7 - 4.0 K/uL Final  . Monocytes Relative 09/30/2018 9  % Final  . Monocytes Absolute 09/30/2018 0.2  0.1 - 1.0 K/uL Final  . Eosinophils  Relative 09/30/2018 1  % Final  . Eosinophils Absolute 09/30/2018 0.0  0.0 - 0.5 K/uL Final  . Basophils Relative 09/30/2018 0  % Final  . Basophils Absolute 09/30/2018 0.0  0.0 - 0.1 K/uL Final  . Immature Granulocytes 09/30/2018 0  % Final  . Abs Immature Granulocytes 09/30/2018 0.00  0.00 - 0.07 K/uL Final   Performed at [facility], [address] [place], [place] [address]    Assessment:  ELOISA CHOKSHI is a 52 y.o. female with a history of stage IB1 cervical cancer s/p concurrent cisplatin and radiation (external beam and brachytherapy) from 11/2016 - 02/2017.    She was treated by Dr. Christene Slates at [facility] in [place], [place].  In 01/2017, she received cisplatin x 2 and carboplatin x 1 (01/29/2017) due to ARF and XRT. Radiation was followed by T & O on 02/01/2017 and T & N on 02/10/2017 and 02/20/2017.  Course was complicated by weight loss (80 pounds), nausea, vomiting, electrolyte wasting (potassium and magnesium), and multiple hospitalizations.  PET scan in 06/2017 revealed enlarging pulmonary nodules but no evidence of abdominal disease per patient report.  She is s/p right ileocolectomy on 08/31/2017 for a small bowel obstruction.  She presented with abdominal pain.  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 PDL-1 testing reveals a CPS score of 15.  Chest, abdomen, and pelvis CT on 04/15/2018 revealed innumerable (> 100) cavitary nodules scattered in the lungs, moderately enlarging compared to the 11/08/2017 PET-CT, suspicious for metastatic disease. One index node in the RLL measures 1.0 x 1.1 cm.  There were no new nodules.  There was an ill-defined wall thickening in the rectosigmoid with surrounding stranding along fascia planes, probably sequela from prior radiation therapy.  There was multilevel  lumbar impingement due to spondylosis and degenerative disc disease.  There was heterogeneous enhancement in the uterus (some possibly from prior radiation therapy).  PET scan on 04/23/2018 revealed numerous scattered solid and cavitary nodules in the lungs stable increased in size compared to the prior PET-CT from 11/08/2017. Largest nodule was 1.1 cm in the LUL (SUV 1.9).  These demonstrated low-grade metabolic activity up to a maximum SUV of about 2.3, increased from 11/08/2017.   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  She s/p 4 cycles of carboplatin and Taxol (06/04/2018 - 09/03/2018) with Margarette Canada support.    Chest CT on 09/30/2018 revealed interval decrease in size and number of metastatic pulmonary nodules.  There were no new/progressive findings in the chest or upper abdomen  Hypercoagulable work-up on 09/14/2017 revealed heterozygosity for Factor V Leiden (single [id number] mutation).  Testing was negative for prothrombin gene mutation, lupus anticoagulant panel, anticardiolipin antibodies, and beta-2 glycoprotein antibodies.  She was discharged on Xarelto.  She was admitted to [facility] from 12/03/2017 - 12/08/2017 with abdominal pain. She was not felt to have a uterine infarct.  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  The etiology was unclear.  She is hepatitis C positive.   Hepatitis C genotype is 2a/2c.  She has B12 deficiency.  B12 was 165 on 11/27/2017.  She began B12 injections on 11/29/2017.  B12 injections occur monthly at home.  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  She was diagnosed with colitis.  Abdomen and pelvic CT on 05/23/2018 revealed colonic wall thickening, consistent with colitis. Considerations include inflammatory or infectious causes. Ischemic causes would be less likely. She is s/p RIGHT hemicolectomy. She had a patent ileocolic  anastomosis.  She received a 10 day course of ciprofloxacin and Flagyl.  Abdomen and pelvic CT angiogram on 09/07/2018 revealed colitis, mainly affecting lower loops, question radiation colitis if there has been radiotherapy of the patient's cervical cancer.  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   She has  chronic diarrhea s/p right hemicolectomy.  She is on colestipol, budesonide, amitriptyline, and Viberzi.  She has adrenal insufficiency.  She is on hydrocortisone.  Symptomatically, she is doing well.  Diarrhea has improved.  She has mild chronic abdominal discomfort.  Exam is stable.  ANC is 1700.  Plan: 1. Labs today:  CBC with diff, CMP, Mg. 2. Metastatic cervical cancer: Review interval chest CT- improvement in pulmonary nodules.  Images personally reviewed.  Agree with radiology interpretation. Continue current treatment.  Patient in agreement. Cycle #5 carboplatin and Taxol today. RTC tomorrow for Neulasta. 3. Increased alkaline phosphatase:  Alkaline phosphatase 138 (38-126), improving.  Etiology likely secondary to Neulasta. 4. Adrenal insufficiency: Continue hydrocortisone. Follow-up with endocrinology. 5. Nausea, vomiting, and diarrhea:  Recurrent episodes with chemotherapy.  Low grade nausea off chemotherapy.  Discuss symptom management.  She has antiemetics and pain medications at home to use on a prn bases.  Interventions  are adequate.    6. Dehydration and electrolyte derangements: Magnesium 1.5. Magnesium 4 gm IV. 7. B12 deficiency: Continue monthly B12 at home. 8.   RTC on 12/18, 12/19, 12/20, 12/23 and 12/24 for IVFs with K+ and Mg+ 9.   RTC on 10/22/2018 for MD assessment, labs (CBC with diff, CMP, Mg), review chest CT, and +/- cycle #6 paclitaxel + carboplatin with Neulasta support   Honor Loh, NP  09/30/2018, 10:20 AM   I saw and evaluated the patient, participating in the key portions of the service and reviewing pertinent diagnostic studies and records.   I reviewed the nurse practitioner's note and agree with the findings and the plan.  The assessment and plan were discussed with the patient.  Multiple questions were asked by the patient and answered.   Nolon Stalls, MD 09/30/2018,10:20 AM

## 2018-10-01 ENCOUNTER — Inpatient Hospital Stay: Payer: Medicaid Other

## 2018-10-01 DIAGNOSIS — Z5111 Encounter for antineoplastic chemotherapy: Secondary | ICD-10-CM | POA: Diagnosis not present

## 2018-10-01 DIAGNOSIS — C538 Malignant neoplasm of overlapping sites of cervix uteri: Secondary | ICD-10-CM

## 2018-10-01 MED ORDER — PEGFILGRASTIM INJECTION 6 MG/0.6ML ~~LOC~~
6.0000 mg | PREFILLED_SYRINGE | Freq: Once | SUBCUTANEOUS | Status: AC
  Administered 2018-10-01: 6 mg via SUBCUTANEOUS
  Filled 2018-10-01: qty 0.6

## 2018-10-02 ENCOUNTER — Inpatient Hospital Stay: Payer: Medicaid Other

## 2018-10-02 ENCOUNTER — Other Ambulatory Visit: Payer: Self-pay | Admitting: Urgent Care

## 2018-10-02 DIAGNOSIS — R112 Nausea with vomiting, unspecified: Secondary | ICD-10-CM

## 2018-10-02 DIAGNOSIS — T451X5A Adverse effect of antineoplastic and immunosuppressive drugs, initial encounter: Secondary | ICD-10-CM

## 2018-10-02 DIAGNOSIS — Z5111 Encounter for antineoplastic chemotherapy: Secondary | ICD-10-CM | POA: Diagnosis not present

## 2018-10-02 MED ORDER — SODIUM CHLORIDE 0.9 % IV SOLN
Freq: Once | INTRAVENOUS | Status: DC
  Filled 2018-10-02: qty 1000

## 2018-10-02 MED ORDER — SODIUM CHLORIDE 0.9 % IV SOLN
INTRAVENOUS | Status: DC
  Filled 2018-10-02: qty 1000

## 2018-10-02 MED ORDER — MAGNESIUM SULFATE 4 GM/100ML IV SOLN: 4.0000 g | Freq: Once | INTRAVENOUS | Status: DC

## 2018-10-02 MED ORDER — SODIUM CHLORIDE 0.9 % IV SOLN
INTRAVENOUS | Status: DC
  Administered 2018-10-02: 12:00:00 via INTRAVENOUS
  Filled 2018-10-02 (×2): qty 1000

## 2018-10-03 ENCOUNTER — Inpatient Hospital Stay: Payer: Medicaid Other

## 2018-10-03 DIAGNOSIS — C538 Malignant neoplasm of overlapping sites of cervix uteri: Secondary | ICD-10-CM

## 2018-10-03 DIAGNOSIS — Z5111 Encounter for antineoplastic chemotherapy: Secondary | ICD-10-CM | POA: Diagnosis not present

## 2018-10-03 LAB — BASIC METABOLIC PANEL
Anion gap: 7 (ref 5–15)
BUN: 13 mg/dL (ref 6–20)
CO2: 23 mmol/L (ref 22–32)
Calcium: 9.3 mg/dL (ref 8.9–10.3)
Chloride: 105 mmol/L (ref 98–111)
Creatinine, Ser: 0.74 mg/dL (ref 0.44–1.00)
GFR calc Af Amer: >60 mL/min (ref >60)
GFR calc non Af Amer: >60 mL/min (ref >60)
Glucose, Bld: 83 mg/dL (ref 70–99)
Potassium: 4.1 mmol/L (ref 3.5–5.1)
Sodium: 135 mmol/L (ref 135–145)

## 2018-10-03 LAB — MAGNESIUM: Magnesium: 1.7 mg/dL (ref 1.7–2.4)

## 2018-10-03 MED ORDER — HEPARIN SOD (PORK) LOCK FLUSH 100 UNIT/ML IV SOLN
500.0000 [IU] | Freq: Once | INTRAVENOUS | Status: AC
  Administered 2018-10-03: 500 [IU] via INTRAVENOUS

## 2018-10-03 MED ORDER — HEPARIN SOD (PORK) LOCK FLUSH 100 UNIT/ML IV SOLN
INTRAVENOUS | Status: AC
  Filled 2018-10-03: qty 5

## 2018-10-03 NOTE — Progress Notes (Signed)
Notified Gaspar Bidding, NP that Mrs. Padron's potassium and Mag is normal. She is complaining of nausea and diarrhea. No treatment is needed today per [facility]. Patient was deaccessed and sent home as ordered. She was informed to call the cancer center if there is any new changes. Vital signs stable.

## 2018-10-04 ENCOUNTER — Inpatient Hospital Stay: Payer: Medicaid Other

## 2018-10-04 ENCOUNTER — Encounter: Payer: Self-pay | Admitting: Hematology and Oncology

## 2018-10-04 ENCOUNTER — Inpatient Hospital Stay (HOSPITAL_BASED_OUTPATIENT_CLINIC_OR_DEPARTMENT_OTHER): Payer: Medicaid Other | Admitting: Oncology

## 2018-10-04 ENCOUNTER — Encounter: Payer: Self-pay | Admitting: Oncology

## 2018-10-04 ENCOUNTER — Other Ambulatory Visit: Payer: Self-pay

## 2018-10-04 VITALS — BP 128/78 | HR 78 | Temp 98.1°F | Resp 22

## 2018-10-04 DIAGNOSIS — R112 Nausea with vomiting, unspecified: Secondary | ICD-10-CM

## 2018-10-04 DIAGNOSIS — R109 Unspecified abdominal pain: Secondary | ICD-10-CM

## 2018-10-04 DIAGNOSIS — C7800 Secondary malignant neoplasm of unspecified lung: Secondary | ICD-10-CM | POA: Diagnosis not present

## 2018-10-04 DIAGNOSIS — E2740 Unspecified adrenocortical insufficiency: Secondary | ICD-10-CM

## 2018-10-04 DIAGNOSIS — Z95828 Presence of other vascular implants and grafts: Secondary | ICD-10-CM

## 2018-10-04 DIAGNOSIS — Z79899 Other long term (current) drug therapy: Secondary | ICD-10-CM

## 2018-10-04 DIAGNOSIS — R197 Diarrhea, unspecified: Secondary | ICD-10-CM

## 2018-10-04 DIAGNOSIS — R748 Abnormal levels of other serum enzymes: Secondary | ICD-10-CM

## 2018-10-04 DIAGNOSIS — E860 Dehydration: Secondary | ICD-10-CM

## 2018-10-04 DIAGNOSIS — D6851 Activated protein C resistance: Secondary | ICD-10-CM | POA: Diagnosis not present

## 2018-10-04 DIAGNOSIS — Z5111 Encounter for antineoplastic chemotherapy: Secondary | ICD-10-CM | POA: Diagnosis not present

## 2018-10-04 DIAGNOSIS — E871 Hypo-osmolality and hyponatremia: Secondary | ICD-10-CM

## 2018-10-04 DIAGNOSIS — C538 Malignant neoplasm of overlapping sites of cervix uteri: Secondary | ICD-10-CM

## 2018-10-04 DIAGNOSIS — E538 Deficiency of other specified B group vitamins: Secondary | ICD-10-CM

## 2018-10-04 DIAGNOSIS — Z87891 Personal history of nicotine dependence: Secondary | ICD-10-CM

## 2018-10-04 DIAGNOSIS — A0472 Enterocolitis due to Clostridium difficile, not specified as recurrent: Secondary | ICD-10-CM

## 2018-10-04 DIAGNOSIS — Z923 Personal history of irradiation: Secondary | ICD-10-CM

## 2018-10-04 LAB — CBC WITH DIFFERENTIAL/PLATELET
Abs Immature Granulocytes: 2.37 10*3/uL — ABNORMAL HIGH (ref 0.00–0.07)
Basophils Absolute: 0.1 10*3/uL (ref 0.0–0.1)
Basophils Relative: 1 %
Eosinophils Absolute: 0.1 10*3/uL (ref 0.0–0.5)
Eosinophils Relative: 1 %
HCT: 38.4 % (ref 36.0–46.0)
Hemoglobin: 13 g/dL (ref 12.0–15.0)
Immature Granulocytes: 17 %
Lymphocytes Relative: 9 %
Lymphs Abs: 1.3 10*3/uL (ref 0.7–4.0)
MCH: 34 pg (ref 26.0–34.0)
MCHC: 33.9 g/dL (ref 30.0–36.0)
MCV: 100.5 fL — ABNORMAL HIGH (ref 80.0–100.0)
Monocytes Absolute: 0.1 10*3/uL (ref 0.1–1.0)
Monocytes Relative: 1 %
Neutro Abs: 10.3 10*3/uL — ABNORMAL HIGH (ref 1.7–7.7)
Neutrophils Relative %: 71 %
Platelets: 169 10*3/uL (ref 150–400)
RBC: 3.82 MIL/uL — ABNORMAL LOW (ref 3.87–5.11)
RDW: 13 % (ref 11.5–15.5)
WBC: 14.3 10*3/uL — ABNORMAL HIGH (ref 4.0–10.5)
nRBC: 0 % (ref 0.0–0.2)

## 2018-10-04 LAB — COMPREHENSIVE METABOLIC PANEL
ALT: 48 U/L — ABNORMAL HIGH (ref 0–44)
AST: 38 U/L (ref 15–41)
Albumin: 4.5 g/dL (ref 3.5–5.0)
Alkaline Phosphatase: 208 U/L — ABNORMAL HIGH (ref 38–126)
Anion gap: 15 (ref 5–15)
BUN: 13 mg/dL (ref 6–20)
CO2: 20 mmol/L — ABNORMAL LOW (ref 22–32)
Calcium: 9.9 mg/dL (ref 8.9–10.3)
Chloride: 98 mmol/L (ref 98–111)
Creatinine, Ser: 0.77 mg/dL (ref 0.44–1.00)
GFR calc Af Amer: >60 mL/min (ref >60)
GFR calc non Af Amer: >60 mL/min (ref >60)
Glucose, Bld: 108 mg/dL — ABNORMAL HIGH (ref 70–99)
Potassium: 4 mmol/L (ref 3.5–5.1)
Sodium: 133 mmol/L — ABNORMAL LOW (ref 135–145)
Total Bilirubin: 1.3 mg/dL — ABNORMAL HIGH (ref 0.3–1.2)
Total Protein: 8.6 g/dL — ABNORMAL HIGH (ref 6.5–8.1)

## 2018-10-04 LAB — MAGNESIUM: Magnesium: 1.6 mg/dL — ABNORMAL LOW (ref 1.7–2.4)

## 2018-10-04 MED ORDER — SODIUM CHLORIDE 0.9 % IV SOLN: 2.0000 g | Freq: Once | INTRAVENOUS | Status: DC

## 2018-10-04 MED ORDER — MAGNESIUM SULFATE 2 GM/50ML IV SOLN
2.0000 g | Freq: Once | INTRAVENOUS | Status: AC
  Administered 2018-10-04: 2 g via INTRAVENOUS
  Filled 2018-10-04: qty 50

## 2018-10-04 MED ORDER — DEXAMETHASONE SODIUM PHOSPHATE 10 MG/ML IJ SOLN
10.0000 mg | Freq: Once | INTRAMUSCULAR | Status: AC
  Administered 2018-10-04: 10 mg via INTRAVENOUS
  Filled 2018-10-04: qty 1

## 2018-10-04 MED ORDER — MORPHINE SULFATE 2 MG/ML IJ SOLN
2.0000 mg | Freq: Once | INTRAMUSCULAR | Status: AC
  Administered 2018-10-04: 2 mg via INTRAVENOUS
  Filled 2018-10-04: qty 1

## 2018-10-04 MED ORDER — HEPARIN SOD (PORK) LOCK FLUSH 100 UNIT/ML IV SOLN
500.0000 [IU] | Freq: Once | INTRAVENOUS | Status: AC
  Administered 2018-10-04: 500 [IU] via INTRAVENOUS

## 2018-10-04 MED ORDER — SODIUM CHLORIDE 0.9 % IV SOLN
INTRAVENOUS | Status: DC
  Administered 2018-10-04: 09:00:00 via INTRAVENOUS
  Filled 2018-10-04 (×2): qty 250

## 2018-10-04 MED ORDER — SODIUM CHLORIDE 0.9% FLUSH
10.0000 mL | INTRAVENOUS | Status: DC | PRN
  Administered 2018-10-04: 10 mL via INTRAVENOUS
  Filled 2018-10-04: qty 10

## 2018-10-04 MED ORDER — ONDANSETRON HCL 4 MG/2ML IJ SOLN
8.0000 mg | Freq: Once | INTRAMUSCULAR | Status: AC
  Administered 2018-10-04: 8 mg via INTRAVENOUS
  Filled 2018-10-04: qty 4

## 2018-10-04 MED ORDER — HYDROCODONE-ACETAMINOPHEN 5-325 MG PO TABS: 1.0000 | ORAL_TABLET | Freq: Four times a day (QID) | ORAL | 0 refills | Status: DC | PRN

## 2018-10-04 NOTE — Progress Notes (Signed)
Symptom Management Consult note [facility]  [phone] [phone]  Patient Care Team: Arnetha Courser, MD as PCP - General (Family Medicine) Mellody Drown, MD as Consulting Physician (Obstetrics and Gynecology) Lenor Coffin, MD as Attending Physician (Gynecology) Lequita Asal, MD as Medical Oncologist (Hematology and Oncology) [name], MD as Consulting Physician (Gastroenterology) Michael Boston, MD as Consulting Physician (General Surgery) Lovell Sheehan, MD as Consulting Physician (Orthopedic Surgery) Clent Jacks, RN as Registered Nurse   Name of the patient: Joanna Hall  [id number]  05-04-1966   Date of visit: 10/04/2018  Diagnosis: Cervical adenocarcinoma  Chief Complaint: abdominal pain  Current Treatment: s/p cycle 5 carbo/Taxol.  Last given on 09/30/2018  Oncology History: Patient last seen by Dr. Mike Gip primary medical oncologist on 09/30/2018 for review of recent CT scan and assessment prior to cycle 5 carbo/Taxol.  She noted improvement in her diarrhea but complained of persistent nausea with meals.  Denied any infections and maintained an adequate appetite.  She denied any pain.  She is scheduled several times a week for IV fluids +/-electrolyte replacement based on labs.  Most recent labs from 10/03/2018 were normal.  She did not require IV fluids.  She was evaluated in the ER on 09/07/2018 for nausea, vomiting and diarrhea.  Elevated lactic acid.  Imaging revealed stable colitis.  She was evaluated in [facility] most recently on 09/20/2018 for dizziness and hypomagnesemia.  She received 4 g magnesium, 1 L NaCl and 8 mg Zofran in clinic.  Oncology History   Patient was diagnosed with cervical adenocarcinoma in [place] in 09/2016.  She has had a long-standing history of abnormal Pap smears.  She presented with an ovarian cyst.  Laparoscopic surgery was pursued but was difficult due to  scar tissue.  Had BSO and rupture of cyst with purulent drainage into the abdomen.  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  Radical hysterectomy was aborted.  She was treated by Dr. Christene Slates at [facility] in [place], [place].  Decision was made to pursue concurrent chemotherapy (weekly cisplatin) and radiation.  She received treatment from 11/2016-05/2017.  01/2017 cisplatin x 2 and carboplatin x 1 (01/29/2017) due to ARF and XRT.  XRT was followed by T & O on 02/01/2017 and T & N 02/10/2017 and 02/20/2017.  Course was complicated by 80 pound weight loss, nausea, vomiting, electrolyte wasting (potassium and magnesium).  She describes that.  Is been sick constantly requiring at least 20 hospitalizations.  Follow-up CT chest and PET on 06/2017. Per patient, 'radiation worked' and no disease in the abdomen.  At that time she was noted to have lung nodules that were growing and follow-up imaging was scheduled for 10/2017.  She was admitted to hospital in [place] for small bowel obstruction which was managed conservatively and she was home for a week prior to traveling to [place] for Thanksgiving holiday where she has family.  She presented to ER in [place] on 08/2017 with nausea, vomiting, and lower abdominal pain.  Symptoms did not respond to conservative treatment.  CT on 08/26/2017 revealed small bowel obstruction with transition in the pelvis just superior to the uterus rather was a long segment of distal ileum with fatty wall thickening compatible with chronic inflammation and/or radiation enteritis. Imaging showed numerous pulmonary nodules consistent with metastatic disease. She underwent laparotomy and right ileocolectomy on 08/31/2017 at [facility].  Surgical findings revealed a thickened, matted, and  scarred piece of distal small bowel close to the ileocecal valve.  She was discharged on 09/05/2017.  Pain markedly increased in intensity and imaging  was performed on 09/11/2017 which revealed: Debris within the anterior abdominal wall incision concerning for infection versus packing material, s/p post ileo-colectomy with expected postoperative changes, mild colonic ileus, numerous pulmonary nodules highly concerning for metastatic disease, punctate nonobstructing nephrolithiasis.  Staples were removed and one was packed.  She was started on doxycycline.  Abdominal and pelvic CT without contrast on 09/11/2017 revealed debris within anterior abdominal wall incision concerning for infection, versus packing material.She is s/pileocolectomy with expected postoperative changes and mild colonic ileus. There were numerous pulmonary nodules highly concerning for metastatic diseaseand punctate nonobstructing nephrolithiasis. She was readmitted on 09/12/2017. She describes the onset of lower abdominal pain on 09/09/2017. Pain markedly increased in intensity on 09/11/2017.  Staples were removed and the wound packed. She was started on doxycycline.  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  PET scan was ordered to evaluate enlarging lung nodules with concern for recurrent cervical cancer but scan was delayed due to insurance and need to be performed in [place].  Presented to ER on 12/03/2017 for abdominal pain and emesis.  Imaging concerning for worsening possible uterine infarct and she was admitted to hospital.  Pelvic MRI was unremarkable.  Remote scarring type changes of uterus thought to be possibly related to radiation.  She was discharged on 12/08/2017.  Underwent  endoscopy and colonoscopy on 12/20/2017.    On 02/22/2018 she underwent laser ablation of condylomata around the anus and vagina under anesthesia with Dr. Johney Maine.   04/15/2018: Chest, abdomen, and pelvis CTrevealed innumerable (>100) cavitary nodules scattered in the lungs, moderately enlarging compared to the 11/08/2017 PET-CT, suspicious for metastatic disease. One index node in the RLL measures 1.0 x 1.1 cm (previously 0.6 x 0.6 cm). There were no new nodules. There was an ill-defined wall thickening in the rectosigmoid with surrounding stranding along fascia planes, probably sequela from prior radiation therapy. There was multilevel lumbar impingement due to spondylosis and degenerative disc disease. There was heterogeneous enhancement in the uterus (some possibly from prior radiation therapy).  04/23/2018:PET scan revealednumerous scattered solid and cavitary nodules in the lungs stable increased in size compared to the prior PET-CT from 11/08/2017.Largest nodule was 1.1 cm in the LUL (SUV 1.9).These demonstratedlow-grade metabolic activity up to a maximum SUV of about 2.3, increased from 11/08/2017.   Case was discussed at tumor board on 04/25/2018. Consensus to pursue CT-guided biopsy (05/06/18) which revealed: Metastatic adenocarcinoma, morphologically consistent with cervical adenocarcinoma.  She has history of chronic hepatitis C which is managed by GI.  Hepatitis C genotype is 2a/2c.  She receives B12 injections for history of B12 deficiency.  On 04/24/2018 she underwent left total knee replacement with Dr. [name].     Malignant neoplasm of overlapping sites of cervix ([place])   09/18/2017 Initial Diagnosis    Malignant neoplasm of overlapping sites of cervix ([place])    06/04/2018 -  Chemotherapy    The patient had palonosetron (ALOXI) injection 0.25 mg, 0.25 mg, Intravenous,  Once, 5 of 10 cycles Administration: 0.25 mg (  06/04/2018), 0.25 mg (06/25/2018), 0.25 mg (09/30/2018), 0.25 mg  (07/23/2018), 0.25 mg (09/03/2018) pegfilgrastim (NEULASTA) injection 6 mg, 6 mg, Subcutaneous, Once, 5 of 10 cycles Administration: 6 mg (06/06/2018), 6 mg (06/26/2018), 6 mg (07/24/2018), 6 mg (09/04/2018), 6 mg (10/01/2018) CARBOplatin (PARAPLATIN) 500 mg in sodium chloride 0.9 % 250 mL chemo infusion, 500 mg (100 % of original dose 497.2 mg), Intravenous,  Once, 5 of 10 cycles Dose modification:   (original dose 497.2 mg, Cycle 1, Reason: Provider Judgment, Comment: difficulty with counts with initial chemo in [place]; advance dose as tolerated),   (original dose 497.2 mg, Cycle 5, Reason: Provider Judgment, Comment: return back to original dose) Administration: 500 mg (06/04/2018), 560 mg (06/25/2018), 500 mg (09/30/2018), 500 mg (07/23/2018) PACLitaxel (TAXOL) 264 mg in sodium chloride 0.9 % 250 mL chemo infusion (> 13m/m2), 140 mg/m2 = 264 mg (100 % of original dose 140 mg/m2), Intravenous,  Once, 5 of 10 cycles Dose modification: 140 mg/m2 (original dose 140 mg/m2, Cycle 1, Reason: Provider Judgment, Comment: difficulty with counts [place] advance dose as tolerated), 155 mg/m2 (original dose 140 mg/m2, Cycle 2, Reason: Provider Judgment, Comment: advance as tolerated), 155 mg/m2 (original dose 175 mg/m2, Cycle 5, Reason: Provider Judgment, Comment: continue current dose) Administration: 264 mg (06/04/2018), 294 mg (06/25/2018), 294 mg (09/30/2018), 294 mg (07/23/2018), 294 mg (09/03/2018)  for chemotherapy treatment.      Subjective Data:  ECOG: 1 - Symptomatic but completely ambulatory   Subjective:     KTANASHA MENEESis a 52y.o. female who presents for evaluation of abdominal pain. Onset was a few hours ago. Symptoms have been gradually worsening. The pain is described as cramping and pressure-like, and is 10/10 in intensity. Pain is located in the RLQ without radiation.  Aggravating factors: none.  Alleviating factors: narcotics and IV fluids. Associated  symptoms: diarrhea, frequency, nausea, vomiting and rectal pain. The patient denies chills, constipation, fever, headache, melena and myalgias.  The patient's history has been marked as reviewed and updated as appropriate.  Review of Systems A comprehensive review of systems was negative except for: Constitutional: positive for fatigue and malaise Gastrointestinal: positive for abdominal pain, diarrhea, nausea and vomiting Musculoskeletal: positive for muscle weakness     Objective:    There were no vitals taken for this visit. General appearance: alert, fatigued, mild distress and pale Lungs: clear to auscultation bilaterally Abdomen: soft, non-tender; bowel sounds normal; no masses,  no organomegaly Extremities: extremities normal, atraumatic, no cyanosis or edema Pulses: 2+ and symmetric    Assessment:    Abdominal pain, likely secondary to known colitis exacerbated by recent carbo/Taxol treatment given on 09/30/2018..Marland Kitchen   Plan:    Recurrent/metastatic cervical cancer: [place]  S/P cisplatin x2 and carbo x1.  Discontinued due to ARF.  Had external beam radiation followed by vaginal brachii therapy in 2018.  PET scan from 06/2017 revealed enlarging pulmonary nodules.  Developed small bowel obstruction in November 2018 requiring right ileocolectomy.  Biopsy of left upper lobe pulmonary nodule confirmed metastatic adenocarcinoma consistent with cervical origin.  Started carbo/Taxol in August 2018.  Currently status post 5 cycles, last given on 09/30/2018.  Several cycles have been postponed due to persistent neutropenia.  Requiring frequent IV fluids unscheduled basis for electrolyte abnormalities.  Scheduled for cycle 6 carbo/Taxol on 10/22/2018.  Abdominal pain: Likely related to known colitis exacerbated by recent chemotherapy.  Nonreproducible during exam today.  Fairly hyperactive bowel  sounds.  Noted bright red blood per rectum since beginning diarrhea.   Ongoing history of rectal fissures and hemorrhoids.  Hemoglobin stable.  She is to continue to monitor this.  This is fairly common for her a few days after receiving chemotherapy.  Has required symptom management post chemo for each cycle.  Patient requesting a refill on narcotic pain medicine.  Last refilled on 09/09/2018 (60 tabs).  Appropriate to refill today.  Sent to local pharmacy.  Recent CT chest with contrast revealed decrease in size and number of pulmonary nodules and no new progressive findings in chest or abdomen.  CT abdomen from 09/07/2018 revealed colitis affecting lower loops.   Leukocytosis: Recently received Neulasta.  Hypomagnesemia: Chronic. D/t chronic diarrhea.  Replace.  Elevated alkaline phosphatase: Inflammatory.  Active colitis.  Elevated liver enzymes: Likely chemo induced.  Hyponatremia: 133.  1 L NaCl replace.  Plan: Stat labs, mild hypomagnesemia (1.6). Give 2 g magnesium in clinic today. Give 1 L NaCl in clinic today. Give 2 mg morphine in clinic today. Give 8 mg Zofran in clinic today.  Most recent imaging is reassuring. No progression of disease as of 09/30/18.  This is likely chronic colitis exacerbated by recent chemotherapy.  She feels much better after receiving fluids, antiemetics and IV morphine.  She is instructed to medications as prescribed by GI and by our clinic.  She is afebrile.  She is scheduled to return to clinic on Monday, 10/07/2018 for fluids and labs and again on 10/08/2018 for fluids and labs.  Greater than 50% was spent in counseling and coordination of care with this patient including but not limited to discussion of the relevant topics above (See A&P) including, but not limited to diagnosis and management of acute and chronic medical conditions.   Faythe Casa, NP 10/04/2018 3:10 PM

## 2018-10-04 NOTE — Progress Notes (Signed)
Patient here in clinic today for labs and possible IV fluids + potassium/magnesium as previously scheduled by Dr. Mike Gip. Patient was doubled over in pain when she arrived and was put in an exam room by RN. Patient moaning and crying because of abdominal pain and cramping. Nausea with vomiting and diarrhea all night and this am.

## 2018-10-04 NOTE — Progress Notes (Signed)
Patient presents to clinic for IV hydration. Patient c/o chills, nausea and vomiting and diarrhea and weakness and shortness of breath. Patient last BM this am prior to arrival. Patient c/o abd cramping. While in the clinic, patient experience small amount of bloody diarrhea.

## 2018-10-07 ENCOUNTER — Inpatient Hospital Stay (HOSPITAL_BASED_OUTPATIENT_CLINIC_OR_DEPARTMENT_OTHER): Payer: Medicaid Other | Admitting: Oncology

## 2018-10-07 ENCOUNTER — Other Ambulatory Visit: Payer: Self-pay

## 2018-10-07 ENCOUNTER — Inpatient Hospital Stay: Payer: Medicaid Other

## 2018-10-07 ENCOUNTER — Encounter: Payer: Self-pay | Admitting: Oncology

## 2018-10-07 VITALS — BP 124/84 | HR 64

## 2018-10-07 VITALS — BP 124/84 | HR 64 | Temp 97.5°F | Resp 20

## 2018-10-07 DIAGNOSIS — C538 Malignant neoplasm of overlapping sites of cervix uteri: Secondary | ICD-10-CM

## 2018-10-07 DIAGNOSIS — R300 Dysuria: Secondary | ICD-10-CM

## 2018-10-07 DIAGNOSIS — C7800 Secondary malignant neoplasm of unspecified lung: Secondary | ICD-10-CM

## 2018-10-07 DIAGNOSIS — D6851 Activated protein C resistance: Secondary | ICD-10-CM

## 2018-10-07 DIAGNOSIS — Z79899 Other long term (current) drug therapy: Secondary | ICD-10-CM

## 2018-10-07 DIAGNOSIS — I951 Orthostatic hypotension: Secondary | ICD-10-CM

## 2018-10-07 DIAGNOSIS — Z95828 Presence of other vascular implants and grafts: Secondary | ICD-10-CM

## 2018-10-07 DIAGNOSIS — K625 Hemorrhage of anus and rectum: Secondary | ICD-10-CM

## 2018-10-07 DIAGNOSIS — R531 Weakness: Secondary | ICD-10-CM

## 2018-10-07 DIAGNOSIS — Z923 Personal history of irradiation: Secondary | ICD-10-CM

## 2018-10-07 DIAGNOSIS — E860 Dehydration: Secondary | ICD-10-CM | POA: Diagnosis not present

## 2018-10-07 DIAGNOSIS — E871 Hypo-osmolality and hyponatremia: Secondary | ICD-10-CM

## 2018-10-07 DIAGNOSIS — E538 Deficiency of other specified B group vitamins: Secondary | ICD-10-CM

## 2018-10-07 LAB — BASIC METABOLIC PANEL
Anion gap: 10 (ref 5–15)
BUN: 16 mg/dL (ref 6–20)
CO2: 24 mmol/L (ref 22–32)
Calcium: 9.1 mg/dL (ref 8.9–10.3)
Chloride: 104 mmol/L (ref 98–111)
Creatinine, Ser: 0.77 mg/dL (ref 0.44–1.00)
GFR calc Af Amer: >60 mL/min (ref >60)
GFR calc non Af Amer: >60 mL/min (ref >60)
Glucose, Bld: 100 mg/dL — ABNORMAL HIGH (ref 70–99)
Potassium: 3.5 mmol/L (ref 3.5–5.1)
Sodium: 138 mmol/L (ref 135–145)

## 2018-10-07 LAB — URINALYSIS, COMPLETE (UACMP) WITH MICROSCOPIC
BACTERIA UA: NONE SEEN
Bilirubin Urine: NEGATIVE
Glucose, UA: NEGATIVE mg/dL
Hgb urine dipstick: NEGATIVE
Ketones, ur: NEGATIVE mg/dL
Nitrite: NEGATIVE
Protein, ur: 30 mg/dL — AB
SPECIFIC GRAVITY, URINE: 1.021 (ref 1.005–1.030)
SQUAMOUS EPITHELIAL / LPF: NONE SEEN (ref 0–5)
WBC, UA: >50 WBC/hpf — ABNORMAL HIGH (ref 0–5)
pH: 5 (ref 5.0–8.0)

## 2018-10-07 LAB — MAGNESIUM: Magnesium: 1.4 mg/dL — ABNORMAL LOW (ref 1.7–2.4)

## 2018-10-07 MED ORDER — SODIUM CHLORIDE 0.9% FLUSH
10.0000 mL | INTRAVENOUS | Status: DC | PRN
  Administered 2018-10-07: 10 mL via INTRAVENOUS
  Filled 2018-10-07: qty 10

## 2018-10-07 MED ORDER — PHENAZOPYRIDINE HCL 200 MG PO TABS: 200.0000 mg | ORAL_TABLET | Freq: Three times a day (TID) | ORAL | 0 refills | Status: DC | PRN

## 2018-10-07 MED ORDER — HEPARIN SOD (PORK) LOCK FLUSH 100 UNIT/ML IV SOLN
500.0000 [IU] | Freq: Once | INTRAVENOUS | Status: AC
  Administered 2018-10-07: 500 [IU] via INTRAVENOUS

## 2018-10-07 MED ORDER — SODIUM CHLORIDE 0.9 % IV SOLN
6.0000 g | Freq: Once | INTRAVENOUS | Status: AC
  Administered 2018-10-07: 6 g via INTRAVENOUS
  Filled 2018-10-07: qty 10

## 2018-10-07 MED ORDER — SODIUM CHLORIDE 0.9 % IV SOLN
Freq: Once | INTRAVENOUS | Status: AC
  Administered 2018-10-07: 09:00:00 via INTRAVENOUS
  Filled 2018-10-07: qty 250

## 2018-10-07 NOTE — Progress Notes (Signed)
Patient here today for iv fluid/magnesium replacement secondary to chronic diarrhea and dehydration. She complains of dysuria and fatigue for the last several days.

## 2018-10-07 NOTE — Progress Notes (Signed)
Symptom Management Consult note [facility]  [phone] [phone]  Patient Care Team: Arnetha Courser, MD as PCP - General (Family Medicine) Mellody Drown, MD as Consulting Physician (Obstetrics and Gynecology) Lenor Coffin, MD as Attending Physician (Gynecology) Lequita Asal, MD as Medical Oncologist (Hematology and Oncology) [name], MD as Consulting Physician (Gastroenterology) Michael Boston, MD as Consulting Physician (General Surgery) Lovell Sheehan, MD as Consulting Physician (Orthopedic Surgery) Clent Jacks, RN as Registered Nurse   Name of the patient: Joanna Hall  [id number]  Jun 18, 1966   Date of visit: 10/07/2018  Diagnosis: Cervical adenocarcinoma  Chief Complaint: Dysuria/chronic diarrhea  Current Treatment: s/p cycle 5 carbo/Taxol.  Last given on 09/30/2018.  Oncology History: Patient was last seen by Dr. Mike Gip primary medical oncologist on 09/30/2018 for review of recent CT scan assessment prior to cycle 5 carbo/Taxol.  She noted improvement in her diarrhea but complained of persistent nausea with meals.  Denied any any infections and maintain adequate appetite.  She denied any pain.  Scheduled several times a week for IV fluids +/- electrolyte replacement based on labs.  Labs from 10/03/2018 were normal.  She did not require IV fluids.  Evaluated in the ER on 09/07/2018 for nausea, vomiting and diarrhea.  Found to have elevated lactic acid.  Imaging revealed stable colitis.  Seen in [facility] on 10/04/2018 for abdominal pain.  Thought to be related to known colitis exacerbation by recent chemotherapy.  Given 2 g magnesium, 1 L NaCl, 2 mg morphine and 8 mg Zofran in clinic.  Oncology History   Patient was diagnosed with cervical adenocarcinoma in [place] in 09/2016.  She has had a long-standing history of abnormal Pap smears.  She presented with an ovarian cyst.  Laparoscopic  surgery was pursued but was difficult due to scar tissue.  Had BSO and rupture of cyst with purulent drainage into the abdomen.  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  Radical hysterectomy was aborted.  She was treated by Dr. Christene Slates at [facility] in [place], [place].  Decision was made to pursue concurrent chemotherapy (weekly cisplatin) and radiation.  She received treatment from 11/2016-05/2017.  01/2017 cisplatin x 2 and carboplatin x 1 (01/29/2017) due to ARF and XRT.  XRT was followed by T & O on 02/01/2017 and T & N 02/10/2017 and 02/20/2017.  Course was complicated by 80 pound weight loss, nausea, vomiting, electrolyte wasting (potassium and magnesium).  She describes that.  Is been sick constantly requiring at least 20 hospitalizations.  Follow-up CT chest and PET on 06/2017. Per patient, 'radiation worked' and no disease in the abdomen.  At that time she was noted to have lung nodules that were growing and follow-up imaging was scheduled for 10/2017.  She was admitted to hospital in [place] for small bowel obstruction which was managed conservatively and she was home for a week prior to traveling to [place] for Thanksgiving holiday where she has family.  She presented to ER in [place] on 08/2017 with nausea, vomiting, and lower abdominal pain.  Symptoms did not respond to conservative treatment.  CT on 08/26/2017 revealed small bowel obstruction with transition in the pelvis just superior to the uterus rather was a long segment of distal ileum with fatty wall thickening compatible with chronic inflammation and/or radiation enteritis. Imaging showed numerous pulmonary nodules consistent with metastatic disease. She underwent laparotomy and right ileocolectomy on 08/31/2017 at [facility].  Surgical findings revealed a thickened, matted, and scarred piece of distal small bowel close to the ileocecal valve.  She was discharged on  09/05/2017.  Pain markedly increased in intensity and imaging was performed on 09/11/2017 which revealed: Debris within the anterior abdominal wall incision concerning for infection versus packing material, s/p post ileo-colectomy with expected postoperative changes, mild colonic ileus, numerous pulmonary nodules highly concerning for metastatic disease, punctate nonobstructing nephrolithiasis.  Staples were removed and one was packed.  She was started on doxycycline.  Abdominal and pelvic CT without contrast on 09/11/2017 revealed debris within anterior abdominal wall incision concerning for infection, versus packing material.She is s/pileocolectomy with expected postoperative changes and mild colonic ileus. There were numerous pulmonary nodules highly concerning for metastatic diseaseand punctate nonobstructing nephrolithiasis. She was readmitted on 09/12/2017. She describes the onset of lower abdominal pain on 09/09/2017. Pain markedly increased in intensity on 09/11/2017.  Staples were removed and the wound packed. She was started on doxycycline.  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  PET scan was ordered to evaluate enlarging lung nodules with concern for recurrent cervical cancer but scan was delayed due to insurance and need to be performed in [place].  Presented to ER on 12/03/2017 for abdominal pain and emesis.  Imaging concerning for worsening possible uterine infarct and she was admitted to hospital.  Pelvic MRI was unremarkable.  Remote scarring type changes of uterus thought to be possibly  related to radiation.  She was discharged on 12/08/2017.  Underwent endoscopy and colonoscopy on 12/20/2017.    On 02/22/2018 she underwent laser ablation of condylomata around the anus and vagina under anesthesia with Dr. Johney Maine.   04/15/2018: Chest, abdomen, and pelvis CTrevealed innumerable (>100) cavitary nodules scattered in the lungs, moderately enlarging compared to the 11/08/2017 PET-CT, suspicious for metastatic disease. One index node in the RLL measures 1.0 x 1.1 cm (previously 0.6 x 0.6 cm). There were no new nodules. There was an ill-defined wall thickening in the rectosigmoid with surrounding stranding along fascia planes, probably sequela from prior radiation therapy. There was multilevel lumbar impingement due to spondylosis and degenerative disc disease. There was heterogeneous enhancement in the uterus (some possibly from prior radiation therapy).  04/23/2018:PET scan revealednumerous scattered solid and cavitary nodules in the lungs stable increased in size compared to the prior PET-CT from 11/08/2017.Largest nodule was 1.1 cm in the LUL (SUV 1.9).These demonstratedlow-grade metabolic activity up to a maximum SUV of about 2.3, increased from 11/08/2017.   Case was discussed at tumor board on 04/25/2018. Consensus to pursue CT-guided biopsy (05/06/18) which revealed: Metastatic adenocarcinoma, morphologically consistent with cervical adenocarcinoma.  She has history of chronic hepatitis C which is managed by GI.  Hepatitis C genotype is 2a/2c.  She receives B12 injections for history of B12 deficiency.  On 04/24/2018 she underwent left total knee replacement with Dr. [name].     Malignant neoplasm of overlapping sites of cervix ([place])   09/18/2017 Initial Diagnosis    Malignant neoplasm of overlapping sites of cervix ([place])    06/04/2018 -  Chemotherapy    The patient had palonosetron (ALOXI) injection 0.25 mg, 0.25 mg, Intravenous,  Once,  5 of 10 cycles Administration:  0.25 mg (06/04/2018), 0.25 mg (06/25/2018), 0.25 mg (09/30/2018), 0.25 mg (07/23/2018), 0.25 mg (09/03/2018) pegfilgrastim (NEULASTA) injection 6 mg, 6 mg, Subcutaneous, Once, 5 of 10 cycles Administration: 6 mg (06/06/2018), 6 mg (06/26/2018), 6 mg (07/24/2018), 6 mg (09/04/2018), 6 mg (10/01/2018) CARBOplatin (PARAPLATIN) 500 mg in sodium chloride 0.9 % 250 mL chemo infusion, 500 mg (100 % of original dose 497.2 mg), Intravenous,  Once, 5 of 10 cycles Dose modification:   (original dose 497.2 mg, Cycle 1, Reason: Provider Judgment, Comment: difficulty with counts with initial chemo in [place]; advance dose as tolerated),   (original dose 497.2 mg, Cycle 5, Reason: Provider Judgment, Comment: return back to original dose) Administration: 500 mg (06/04/2018), 560 mg (06/25/2018), 500 mg (09/30/2018), 500 mg (07/23/2018) PACLitaxel (TAXOL) 264 mg in sodium chloride 0.9 % 250 mL chemo infusion (> 72m/m2), 140 mg/m2 = 264 mg (100 % of original dose 140 mg/m2), Intravenous,  Once, 5 of 10 cycles Dose modification: 140 mg/m2 (original dose 140 mg/m2, Cycle 1, Reason: Provider Judgment, Comment: difficulty with counts with [place] chemo [place] advance dose as tolerated), 155 mg/m2 (original dose 140 mg/m2, Cycle 2, Reason: Provider Judgment, Comment: advance as tolerated), 155 mg/m2 (original dose 175 mg/m2, Cycle 5, Reason: Provider Judgment, Comment: continue current dose) Administration: 264 mg (06/04/2018), 294 mg (06/25/2018), 294 mg (09/30/2018), 294 mg (07/23/2018), 294 mg (09/03/2018)  for chemotherapy treatment.      Subjective Data:  ECOG: 1 - Symptomatic but completely ambulatory   PKKX:FGHW MSatira Anis MD Chief Complaint  Patient presents with  . Fatigue  . Dysuria     Current Issues:  Presents with 3 days of dysuria, urinary urgency and urinary frequency Associated symptoms include:  dysuria, urinary frequency and urinary hesitancy  There is a previous history of of similar  symptoms.  Sexually active:  No   No concern for STI.  Prior to Admission medications   Medication Sig Start Date End Date Taking? Authorizing Provider  Calcium Carb-Cholecalciferol (CALCIUM 500 +D) 500-400 MG-UNIT TABS Take 2 tablets by mouth daily.   Yes [provider]  colestipol (COLESTID) 1 g tablet Take 2 tablets (2 g total) by mouth 3 (three) times daily with meals. 08/01/18  Yes Wieting, Richard, MD  cyanocobalamin (,VITAMIN B-12,) 1000 MCG/ML injection Inject 1ML every week x 4 weeks, then 1ML every other week x 2 months, then monthly x 3 months Patient taking differently: Inject 1,000 mcg into the muscle every 30 (thirty) days. Inject 1ML every week x 4 weeks, then 1ML every other week x 2 months, then monthly x 3 months 11/29/17  Yes Vanga, RTally Due MD  Eluxadoline (VIBERZI) 100 MG TABS Take 1 tablet (100 mg total) by mouth 2 (two) times daily with a meal. 08/16/18  Yes AVerlon Au NP  HYDROcodone-acetaminophen (NORCO/VICODIN) 5-325 MG tablet Take 1 tablet by mouth every 6 (six) hours as needed for moderate pain. 10/04/18  Yes BJacquelin Hawking NP  Multiple Vitamins-Minerals (MULTIVITAMIN WITH MINERALS) tablet Take 1 tablet by mouth daily. 08/01/18 08/01/19 Yes Wieting, Richard, MD  OLANZapine zydis (ZYPREXA) 5 MG disintegrating tablet Take 1 tablet (5 mg total) by mouth at bedtime. 08/01/18  Yes Wieting, Richard, MD  ondansetron (ZOFRAN) 8 MG tablet Take 1 tablet (8 mg total) by mouth every 8 (eight) hours as needed for nausea or vomiting. 08/23/18  Yes GKaren Kitchens NP  pantoprazole (PROTONIX) 40 MG tablet Take 1 tablet (40 mg  total) by mouth daily. 08/01/18  Yes Loletha Grayer, MD  promethazine (PHENERGAN) 25 MG tablet Take 1 tablet (25 mg total) by mouth every 6 (six) hours as needed for nausea or vomiting. 10/02/18  Yes Karen Kitchens, NP  rivaroxaban (XARELTO) 20 MG TABS tablet Take 1 tablet (20 mg total) by mouth daily with supper. 08/27/18  Yes Karen Kitchens,  NP  Syringe/Needle, Disp, (SYRINGE 3CC/22GX1") 22G X 1" 3 ML MISC For use with Vitamin B12 injections 04/12/18  Yes Vanga, Tally Due, MD  traZODone (DESYREL) 50 MG tablet Take 0.5-1 tablets (25-50 mg total) by mouth at bedtime as needed for sleep. 08/13/18  Yes Karen Kitchens, NP  VENTOLIN HFA 108 (90 Base) MCG/ACT inhaler Inhale 1-2 puffs into the lungs every 4 (four) hours as needed for shortness of breath.    Yes [provider]  amitriptyline (ELAVIL) 50 MG tablet Take 1 tablet (50 mg total) by mouth at bedtime. 08/19/18 09/30/18  [name], MD  phenazopyridine (PYRIDIUM) 200 MG tablet Take 1 tablet (200 mg total) by mouth 3 (three) times daily as needed for pain. 10/07/18   Jacquelin Hawking, NP    Review of Systems: Review of Systems  Constitutional: Negative.  Negative for chills, fever, malaise/fatigue and weight loss.  HENT: Negative for congestion, ear pain and tinnitus.   Eyes: Negative.  Negative for blurred vision and double vision.  Respiratory: Negative.  Negative for cough, sputum production and shortness of breath.   Cardiovascular: Negative.  Negative for chest pain, palpitations and leg swelling.  Gastrointestinal: Negative.  Negative for abdominal pain, constipation, diarrhea, nausea and vomiting.  Genitourinary: Positive for dysuria, frequency and urgency.  Musculoskeletal: Negative for back pain and falls.  Skin: Negative.  Negative for rash.  Neurological: Negative.  Negative for weakness and headaches.  Endo/Heme/Allergies: Negative.  Does not bruise/bleed easily.  Psychiatric/Behavioral: Negative.  Negative for depression. The patient is not nervous/anxious and does not have insomnia.     PE:  BP 124/84 (BP Location: Right Arm, Patient Position: Sitting) Comment: standing bp 106/73  Pulse 64 Comment: standing pulse 111  Temp (!) 97.5 F (36.4 C) (Tympanic)   Resp 20  Physical Exam Constitutional:      Appearance: Normal appearance.  HENT:      Head: Normocephalic and atraumatic.  Eyes:     Pupils: Pupils are equal, round, and reactive to light.  Neck:     Musculoskeletal: Normal range of motion.  Cardiovascular:     Rate and Rhythm: Normal rate and regular rhythm.     Heart sounds: Normal heart sounds. No murmur.  Pulmonary:     Effort: Pulmonary effort is normal.     Breath sounds: Normal breath sounds. No wheezing.  Abdominal:     General: Bowel sounds are normal. There is no distension.     Palpations: Abdomen is soft.     Tenderness: There is no abdominal tenderness.  Musculoskeletal: Normal range of motion.  Skin:    General: Skin is warm and dry.     Findings: No rash.  Neurological:     Mental Status: She is alert and oriented to person, place, and time.  Psychiatric:        Judgment: Judgment normal.      Results for orders placed or performed in visit on 10/07/18  Urinalysis, Complete w Microscopic  Result Value Ref Range   Color, Urine YELLOW (A) YELLOW   APPearance CLEAR (A) CLEAR  Specific Gravity, Urine 1.021 1.005 - 1.030   pH 5.0 5.0 - 8.0   Glucose, UA NEGATIVE NEGATIVE mg/dL   Hgb urine dipstick NEGATIVE NEGATIVE   Bilirubin Urine NEGATIVE NEGATIVE   Ketones, ur NEGATIVE NEGATIVE mg/dL   Protein, ur 30 (A) NEGATIVE mg/dL   Nitrite NEGATIVE NEGATIVE   Leukocytes, UA SMALL (A) NEGATIVE   RBC / HPF 0-5 0 - 5 RBC/hpf   WBC, UA >50 (H) 0 - 5 WBC/hpf   Bacteria, UA NONE SEEN NONE SEEN   Squamous Epithelial / LPF NONE SEEN 0 - 5   Mucus PRESENT     Assessment and Plan:   Recurrent/metastatic cervical cancer: Diagnosed in 12/17 in [place].  Status post cisplatin x2 and carbo x1.  Discontinued due to ARF.  Had external beam radiation followed by vaginal brachii therapy in 2018.  PET scan from 06/2017 revealed enlarging pulmonary nodules.  Developed small bowel obstruction in November 2018 requiring right ileocolectomy.  Biopsy of left upper lobe pulmonary nodule confirmed metastatic  adenocarcinoma consistent with cervical origin.  Started carbo/Taxol in August 2019.  Currently status post 5 cycles, last given on 09/30/2018.  Several cycles have been postponed due to persistent neutropenia.  Requiring frequent IV fluids.  Schedule routinely due to frequent electrolyte abnormalities.  Scheduled for cycle 6 carbo/Taxol on 10/22/2018.  Rectal bleeding: Intermittent.  Has history of fissures and internal hemorrhoids.  Sees Dr. Marius Ditch with GI.  She is on Xarelto for factor V Leiden mutation carrier and history of blood clots.  Given chronic colitis, and report of frank red blood (1 episode) which is more than usual, will arrange for evaluation by GI.  Last colonoscopy was from 07/30/2018 revealed healthy-appearing mucosa and negative biopsies.   Dysuria: Per patient, she has a history of recurrent UTIs although I cannot retrace the last time she was treated. Complains of dysuria, increased frequency and hesitancy.  She has had several urinalysis which were negative for infection.  States she has "burning and increased frequency" with each urination.  We will not treat based on symptoms alone and will allow for culture to see if anything grows. RX Pyridium 200 mg 3 times daily for urinary discomfort.   Plan Stat labs.  Magnesium 1.4.  Will give 6 g magnesium with 1 L NaCl today in clinic.  Patient will return to clinic tomorrow for repeat labs and possible IV fluids +/-electrolyte replacement. Stat UA/urine culture.  Will call patient once urine culture results. Rx Pyridium 200 mg 3 times daily for urinary discomfort. Orthostatic.  Recheck blood pressure after IV fluids today.  Improved. Will arrange to be evaluated by Dr. Marius Ditch in GI.  Her next scheduled appointment is on 11/05/2017.  We will see if this can be moved up.  Patient knows if rectal bleeding becomes persistent and/or increases she is to call clinic and/or report directly to the emergency room.   Greater than 50% was spent in  counseling and coordination of care with this patient including but not limited to discussion of the relevant topics above (See A&P) including, but not limited to diagnosis and management of acute and chronic medical conditions.   Faythe Casa, NP 10/08/2018 7:38 AM   CC: Dr. Marius Ditch

## 2018-10-08 ENCOUNTER — Inpatient Hospital Stay (HOSPITAL_BASED_OUTPATIENT_CLINIC_OR_DEPARTMENT_OTHER): Payer: Medicaid Other | Admitting: Hematology and Oncology

## 2018-10-08 ENCOUNTER — Inpatient Hospital Stay: Payer: Medicaid Other

## 2018-10-08 ENCOUNTER — Inpatient Hospital Stay
Admission: EM | Admit: 2018-10-08 | Discharge: 2018-10-11 | DRG: 393 | Disposition: A | Discharge status: Home / Self Care | Payer: Medicaid Other | Source: Ambulatory Visit | Attending: Internal Medicine | Admitting: Internal Medicine

## 2018-10-08 ENCOUNTER — Encounter: Payer: Self-pay | Admitting: Hematology and Oncology

## 2018-10-08 ENCOUNTER — Other Ambulatory Visit: Payer: Self-pay | Admitting: Urgent Care

## 2018-10-08 ENCOUNTER — Encounter: Payer: Self-pay | Admitting: Emergency Medicine

## 2018-10-08 ENCOUNTER — Other Ambulatory Visit: Payer: Self-pay

## 2018-10-08 VITALS — BP 123/83 | HR 96 | Temp 96.0°F | Resp 20

## 2018-10-08 DIAGNOSIS — K922 Gastrointestinal hemorrhage, unspecified: Secondary | ICD-10-CM | POA: Diagnosis present

## 2018-10-08 DIAGNOSIS — D62 Acute posthemorrhagic anemia: Secondary | ICD-10-CM | POA: Diagnosis present

## 2018-10-08 DIAGNOSIS — D696 Thrombocytopenia, unspecified: Secondary | ICD-10-CM | POA: Diagnosis present

## 2018-10-08 DIAGNOSIS — K633 Ulcer of intestine: Secondary | ICD-10-CM | POA: Diagnosis present

## 2018-10-08 DIAGNOSIS — K921 Melena: Secondary | ICD-10-CM

## 2018-10-08 DIAGNOSIS — C530 Malignant neoplasm of endocervix: Secondary | ICD-10-CM | POA: Diagnosis present

## 2018-10-08 DIAGNOSIS — Z90721 Acquired absence of ovaries, unilateral: Secondary | ICD-10-CM

## 2018-10-08 DIAGNOSIS — K625 Hemorrhage of anus and rectum: Secondary | ICD-10-CM

## 2018-10-08 DIAGNOSIS — C539 Malignant neoplasm of cervix uteri, unspecified: Secondary | ICD-10-CM | POA: Diagnosis not present

## 2018-10-08 DIAGNOSIS — D6851 Activated protein C resistance: Secondary | ICD-10-CM | POA: Diagnosis present

## 2018-10-08 DIAGNOSIS — E860 Dehydration: Secondary | ICD-10-CM

## 2018-10-08 DIAGNOSIS — Z79899 Other long term (current) drug therapy: Secondary | ICD-10-CM

## 2018-10-08 DIAGNOSIS — C538 Malignant neoplasm of overlapping sites of cervix uteri: Secondary | ICD-10-CM | POA: Diagnosis not present

## 2018-10-08 DIAGNOSIS — Z87891 Personal history of nicotine dependence: Secondary | ICD-10-CM

## 2018-10-08 DIAGNOSIS — R197 Diarrhea, unspecified: Secondary | ICD-10-CM

## 2018-10-08 DIAGNOSIS — Z96652 Presence of left artificial knee joint: Secondary | ICD-10-CM | POA: Diagnosis present

## 2018-10-08 DIAGNOSIS — C7800 Secondary malignant neoplasm of unspecified lung: Secondary | ICD-10-CM | POA: Diagnosis present

## 2018-10-08 DIAGNOSIS — Z8249 Family history of ischemic heart disease and other diseases of the circulatory system: Secondary | ICD-10-CM | POA: Diagnosis not present

## 2018-10-08 DIAGNOSIS — K219 Gastro-esophageal reflux disease without esophagitis: Secondary | ICD-10-CM | POA: Diagnosis present

## 2018-10-08 DIAGNOSIS — Z9049 Acquired absence of other specified parts of digestive tract: Secondary | ICD-10-CM | POA: Diagnosis not present

## 2018-10-08 DIAGNOSIS — Z923 Personal history of irradiation: Secondary | ICD-10-CM

## 2018-10-08 DIAGNOSIS — G8929 Other chronic pain: Secondary | ICD-10-CM | POA: Diagnosis present

## 2018-10-08 DIAGNOSIS — D61818 Other pancytopenia: Secondary | ICD-10-CM | POA: Diagnosis not present

## 2018-10-08 DIAGNOSIS — E871 Hypo-osmolality and hyponatremia: Secondary | ICD-10-CM

## 2018-10-08 DIAGNOSIS — B182 Chronic viral hepatitis C: Secondary | ICD-10-CM | POA: Diagnosis present

## 2018-10-08 DIAGNOSIS — K648 Other hemorrhoids: Secondary | ICD-10-CM

## 2018-10-08 DIAGNOSIS — I10 Essential (primary) hypertension: Secondary | ICD-10-CM | POA: Diagnosis present

## 2018-10-08 DIAGNOSIS — E876 Hypokalemia: Secondary | ICD-10-CM

## 2018-10-08 DIAGNOSIS — Z7901 Long term (current) use of anticoagulants: Secondary | ICD-10-CM | POA: Diagnosis not present

## 2018-10-08 DIAGNOSIS — R109 Unspecified abdominal pain: Secondary | ICD-10-CM | POA: Diagnosis present

## 2018-10-08 DIAGNOSIS — Z8701 Personal history of pneumonia (recurrent): Secondary | ICD-10-CM

## 2018-10-08 DIAGNOSIS — D72819 Decreased white blood cell count, unspecified: Secondary | ICD-10-CM | POA: Diagnosis not present

## 2018-10-08 DIAGNOSIS — K529 Noninfective gastroenteritis and colitis, unspecified: Secondary | ICD-10-CM | POA: Diagnosis present

## 2018-10-08 DIAGNOSIS — E538 Deficiency of other specified B group vitamins: Secondary | ICD-10-CM

## 2018-10-08 DIAGNOSIS — R300 Dysuria: Secondary | ICD-10-CM

## 2018-10-08 DIAGNOSIS — Z888 Allergy status to other drugs, medicaments and biological substances status: Secondary | ICD-10-CM

## 2018-10-08 DIAGNOSIS — J9601 Acute respiratory failure with hypoxia: Secondary | ICD-10-CM | POA: Diagnosis present

## 2018-10-08 DIAGNOSIS — D649 Anemia, unspecified: Secondary | ICD-10-CM

## 2018-10-08 HISTORY — DX: Gastrointestinal hemorrhage, unspecified: K92.2

## 2018-10-08 LAB — CBC WITH DIFFERENTIAL/PLATELET
Abs Immature Granulocytes: 0.04 10*3/uL (ref 0.00–0.07)
Basophils Absolute: 0 10*3/uL (ref 0.0–0.1)
Basophils Relative: 0 %
Eosinophils Absolute: 0.1 10*3/uL (ref 0.0–0.5)
Eosinophils Relative: 2 %
HCT: 27.4 % — ABNORMAL LOW (ref 36.0–46.0)
Hemoglobin: 8.9 g/dL — ABNORMAL LOW (ref 12.0–15.0)
Immature Granulocytes: 2 %
Lymphocytes Relative: 19 %
Lymphs Abs: 0.5 10*3/uL — ABNORMAL LOW (ref 0.7–4.0)
MCH: 34 pg (ref 26.0–34.0)
MCHC: 32.5 g/dL (ref 30.0–36.0)
MCV: 104.6 fL — ABNORMAL HIGH (ref 80.0–100.0)
Monocytes Absolute: 0.3 10*3/uL (ref 0.1–1.0)
Monocytes Relative: 11 %
Neutro Abs: 1.8 10*3/uL (ref 1.7–7.7)
Neutrophils Relative %: 66 %
Platelets: 81 10*3/uL — ABNORMAL LOW (ref 150–400)
RBC: 2.62 MIL/uL — ABNORMAL LOW (ref 3.87–5.11)
RDW: 12.7 % (ref 11.5–15.5)
WBC: 2.7 10*3/uL — ABNORMAL LOW (ref 4.0–10.5)
nRBC: 0 % (ref 0.0–0.2)

## 2018-10-08 LAB — GASTROINTESTINAL PANEL BY PCR, STOOL (REPLACES STOOL CULTURE)
Adenovirus F40/41: NOT DETECTED
Astrovirus: NOT DETECTED
Campylobacter species: NOT DETECTED
Cryptosporidium: NOT DETECTED
Cyclospora cayetanensis: NOT DETECTED
ENTEROAGGREGATIVE E COLI (EAEC): NOT DETECTED
Entamoeba histolytica: NOT DETECTED
Enteropathogenic E coli (EPEC): NOT DETECTED
Enterotoxigenic E coli (ETEC): NOT DETECTED
Giardia lamblia: NOT DETECTED
Norovirus GI/GII: NOT DETECTED
Plesimonas shigelloides: NOT DETECTED
Rotavirus A: NOT DETECTED
Salmonella species: NOT DETECTED
Sapovirus (I, II, IV, and V): NOT DETECTED
Shiga like toxin producing E coli (STEC): NOT DETECTED
Shigella/Enteroinvasive E coli (EIEC): NOT DETECTED
Vibrio cholerae: NOT DETECTED
Vibrio species: NOT DETECTED
Yersinia enterocolitica: NOT DETECTED

## 2018-10-08 LAB — BASIC METABOLIC PANEL
Anion gap: 5 (ref 5–15)
BUN: 13 mg/dL (ref 6–20)
CO2: 25 mmol/L (ref 22–32)
Calcium: 8.7 mg/dL — ABNORMAL LOW (ref 8.9–10.3)
Chloride: 108 mmol/L (ref 98–111)
Creatinine, Ser: 0.66 mg/dL (ref 0.44–1.00)
GFR calc Af Amer: >60 mL/min (ref >60)
GFR calc non Af Amer: >60 mL/min (ref >60)
Glucose, Bld: 118 mg/dL — ABNORMAL HIGH (ref 70–99)
Potassium: 3.3 mmol/L — ABNORMAL LOW (ref 3.5–5.1)
Sodium: 138 mmol/L (ref 135–145)

## 2018-10-08 LAB — C DIFFICILE QUICK SCREEN W PCR REFLEX
C DIFFICILE (CDIFF) INTERP: NOT DETECTED
C Diff antigen: NEGATIVE
C Diff toxin: NEGATIVE

## 2018-10-08 LAB — HEMOGLOBIN AND HEMATOCRIT, BLOOD
HEMATOCRIT: 33 % — AB (ref 36.0–46.0)
Hemoglobin: 11.1 g/dL — ABNORMAL LOW (ref 12.0–15.0)

## 2018-10-08 LAB — URINE CULTURE: CULTURE: NO GROWTH

## 2018-10-08 LAB — PREPARE RBC (CROSSMATCH)

## 2018-10-08 LAB — MAGNESIUM: Magnesium: 1.7 mg/dL (ref 1.7–2.4)

## 2018-10-08 MED ORDER — ALBUTEROL SULFATE (2.5 MG/3ML) 0.083% IN NEBU: 2.5000 mg | INHALATION_SOLUTION | RESPIRATORY_TRACT | Status: DC | PRN

## 2018-10-08 MED ORDER — HEPARIN SOD (PORK) LOCK FLUSH 100 UNIT/ML IV SOLN: 500.0000 [IU] | Freq: Once | INTRAVENOUS | Status: DC

## 2018-10-08 MED ORDER — SODIUM CHLORIDE 0.9 % IV SOLN: INTRAVENOUS | Status: DC

## 2018-10-08 MED ORDER — SODIUM CHLORIDE 0.9 % IV SOLN
INTRAVENOUS | Status: AC
  Administered 2018-10-08 – 2018-10-09 (×2): via INTRAVENOUS

## 2018-10-08 MED ORDER — SODIUM CHLORIDE 0.9 % IV SOLN
10.0000 mL/h | Freq: Once | INTRAVENOUS | Status: AC
  Administered 2018-10-08: 10 mL/h via INTRAVENOUS

## 2018-10-08 MED ORDER — PROMETHAZINE HCL 25 MG PO TABS
12.5000 mg | ORAL_TABLET | Freq: Four times a day (QID) | ORAL | Status: DC | PRN
  Administered 2018-10-08 – 2018-10-10 (×7): 12.5 mg via ORAL
  Filled 2018-10-08 (×7): qty 1

## 2018-10-08 MED ORDER — HYDROCODONE-ACETAMINOPHEN 5-325 MG PO TABS
1.0000 | ORAL_TABLET | Freq: Four times a day (QID) | ORAL | Status: DC | PRN
  Administered 2018-10-08 – 2018-10-11 (×6): 1 via ORAL
  Filled 2018-10-08 (×7): qty 1

## 2018-10-08 MED ORDER — ONDANSETRON HCL 4 MG/2ML IJ SOLN
4.0000 mg | Freq: Once | INTRAMUSCULAR | Status: AC
  Administered 2018-10-08: 4 mg via INTRAVENOUS
  Filled 2018-10-08: qty 2

## 2018-10-08 MED ORDER — PHENAZOPYRIDINE HCL 200 MG PO TABS
200.0000 mg | ORAL_TABLET | Freq: Three times a day (TID) | ORAL | Status: DC
  Administered 2018-10-08 – 2018-10-11 (×8): 200 mg via ORAL
  Filled 2018-10-08 (×10): qty 1

## 2018-10-08 MED ORDER — OLANZAPINE 5 MG PO TBDP
5.0000 mg | ORAL_TABLET | Freq: Every day | ORAL | Status: DC
  Administered 2018-10-08 – 2018-10-10 (×3): 5 mg via ORAL
  Filled 2018-10-08 (×4): qty 1

## 2018-10-08 MED ORDER — AMITRIPTYLINE HCL 50 MG PO TABS
50.0000 mg | ORAL_TABLET | Freq: Every day | ORAL | Status: DC
  Administered 2018-10-08 – 2018-10-10 (×3): 50 mg via ORAL
  Filled 2018-10-08 (×4): qty 1

## 2018-10-08 MED ORDER — HYDROCORTISONE 10 MG PO TABS
10.0000 mg | ORAL_TABLET | Freq: Three times a day (TID) | ORAL | Status: DC
  Administered 2018-10-08 – 2018-10-11 (×10): 10 mg via ORAL
  Filled 2018-10-08 (×11): qty 1

## 2018-10-08 MED ORDER — SODIUM CHLORIDE 0.9% FLUSH
10.0000 mL | Freq: Once | INTRAVENOUS | Status: AC
  Administered 2018-10-08: 10 mL via INTRAVENOUS
  Filled 2018-10-08: qty 10

## 2018-10-08 MED ORDER — TRAZODONE HCL 50 MG PO TABS
25.0000 mg | ORAL_TABLET | Freq: Every evening | ORAL | Status: DC | PRN
  Administered 2018-10-09 – 2018-10-10 (×2): 50 mg via ORAL
  Filled 2018-10-08 (×3): qty 1

## 2018-10-08 MED ORDER — PANTOPRAZOLE SODIUM 40 MG IV SOLR
40.0000 mg | Freq: Two times a day (BID) | INTRAVENOUS | Status: DC
  Administered 2018-10-08 – 2018-10-10 (×5): 40 mg via INTRAVENOUS
  Filled 2018-10-08 (×5): qty 40

## 2018-10-08 MED ORDER — SODIUM CHLORIDE 0.9 % IV SOLN
Freq: Once | INTRAVENOUS | Status: AC
  Filled 2018-10-08: qty 1000

## 2018-10-08 MED ORDER — ALBUTEROL SULFATE HFA 108 (90 BASE) MCG/ACT IN AERS: 1.0000 | INHALATION_SPRAY | RESPIRATORY_TRACT | Status: DC | PRN

## 2018-10-08 MED ORDER — MORPHINE SULFATE (PF) 4 MG/ML IV SOLN
4.0000 mg | Freq: Once | INTRAVENOUS | Status: AC
  Administered 2018-10-08: 4 mg via INTRAVENOUS
  Filled 2018-10-08: qty 1

## 2018-10-08 NOTE — ED Triage Notes (Addendum)
Pt arrived via POV with reports of being sent by [facility] for low hemoglobin that dropped from 13 down to 8.9 and low platelets as well.  Pt reports rectal bleeding, bright red blood. Pt has hx of colectomy last November. Pt states on Sunday she had dark red clots. Denies any black or tarry stools. 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  Pt has hx of blood transfusions.  Pt had last chemo treatment last Monday.

## 2018-10-08 NOTE — Progress Notes (Signed)
[facility] day:  10/08/2018   Chief Complaint: Joanna Hall is a 52 y.o. female with stage IV cervical cancer who is seen for sick call visit on day 9 of cycle #5 carboplatin and Taxol.  HPI:  The patient was last seen in the medical oncology clinic on 09/30/2018.  At that time, she was doing well.  Diarrhea had improved.  She had mild chronic abdominal discomfort.  Exam was stable.  ANC was 1700.  Chest CT revealed improvement in pulmonary nodules.  She received cycle #5 carboplatin and Taxol with Neulasta support on 10/01/2018.  She received 4 gm of magnesium.  She was scheduled for outpatient IVF and electrolyte supplementation.  She was seen in the ER on 09/07/2018 for nausea, vomiting and diarrhea.  She had an elevated lactic acid.  Imaging revealed stable colitis.  She was seen in the symptom management clinic on 10/04/2018 for abdominal pain.  Pain was felt to be related to known colitis exacerbation by recent chemotherapy.  She was given 2 g magnesium, 1 L NaCl, 2 mg morphine and 8 mg Zofran in clinic.  She was seen by Faythe Casa, NP yesterday in the symptom management clinic.  She described urgency, frequency, frequency.  UA and culture were sent.  She noted rectal bleeding (new).  She states that on Sunday, 10/06/2018, she had a pad full of blood with clots.  No vaginal bleeding.  She was scheduled to see Dr. Marius Ditch.  She received 1 liter NS.  Potassium was 3.5.  Magnesium was 1.4.  She received 6 gm of magnesium.  She was seen this morning in the infusion center.  She noted a second episode of rectal bleeding.  She notes nausea, diarrhea, abdominal pain with bowel movements, a sensation of a need to push.  She has lightheadedness and dizziness with standing.  Labs today reveal a hematocrit of 27.4, hemoglobin 8.9, MCV 104.6, platelets 81,000, WBC 2700 with an ANC of 1800.  CBC on  10/04/2018 revealed a hematocrit of 38.4, hemoglobin  13.0, platelets 169,000, WBC 14,300 with an ANC of 10,300.  Because of new bleeding and a drop in hemoglobin, she was referred to the ER.  Platelet count has dropped.  She is on anticoagulation.   Past Medical History:  Diagnosis Date  . Abnormal cervical Papanicolaou smear 09/18/2017  . Anxiety   . Aortic atherosclerosis ([place])   . Arthritis    neck and knees  . 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 Blood transfusion without reported diagnosis   . Cervical cancer (HCC)    mets lung  . Chronic anal fissure   . Chronic diarrhea   . Dyspnea   . Erosive gastropathy 09/18/2017  . Factor V Leiden mutation ([place])   . Fecal incontinence   . Genital warts   . GERD (gastroesophageal reflux disease)   . Heart murmur   . Hemorrhoids   . 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 Hepatitis, chronic ([place]) 05/05/2017  . History of cancer chemotherapy    completed 06/2017  . History of Clostridium difficile infection    while undergoing chemo.  Negative test 10/2017  . Ileocolic anastomotic leak   . 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 Intestinal infection due to Clostridium difficile 09/18/2017  . Macrocytic anemia with vitamin B12 deficiency   .  Nausea vomiting and diarrhea   . Pancolitis ([place]) 07/27/2018  . Perianal condylomata   . Pneumonia    History of  . Pulmonary nodules   . Rectal bleeding   . Small bowel obstruction ([place]) 08/2017  . Stiff neck    limited right turn  . Vitamin D deficiency     Past Surgical History:  Procedure Laterality Date  . CHOLECYSTECTOMY    . COLON SURGERY  08/2017   resection  . COLONOSCOPY WITH PROPOFOL N/A 12/20/2017   Procedure: COLONOSCOPY WITH PROPOFOL;  Surgeon: [name], MD;  Location: [facility] ENDOSCOPY;  Service: Gastroenterology;  Laterality: N/A;  . COLONOSCOPY WITH PROPOFOL N/A 07/30/2018   Procedure: COLONOSCOPY WITH PROPOFOL;  Surgeon: [name], MD;  Location:  [facility] ENDOSCOPY;  Service: Gastroenterology;  Laterality: N/A;  . DIAGNOSTIC LAPAROSCOPY    . ESOPHAGOGASTRODUODENOSCOPY (EGD) WITH PROPOFOL N/A 12/20/2017   Procedure: ESOPHAGOGASTRODUODENOSCOPY (EGD) WITH PROPOFOL;  Surgeon: [name], MD;  Location: [place];  Service: Gastroenterology;  Laterality: N/A;  . ESOPHAGOGASTRODUODENOSCOPY (EGD) WITH PROPOFOL  07/30/2018   Procedure: ESOPHAGOGASTRODUODENOSCOPY (EGD) WITH PROPOFOL;  Surgeon: [name], MD;  Location: [facility] ENDOSCOPY;  Service: Gastroenterology;;  . LAPAROTOMY N/A 08/31/2017   Procedure: EXPLORATORY LAPAROTOMY for SBO, ileocolectomy, removal of piece of uterine wall;  Surgeon: [name], MD;  Location: [facility] ORS;  Service: General;  Laterality: N/A;  . LASER ABLATION CONDOLAMATA N/A 02/22/2018   Procedure: LASER ABLATION/REMOVAL OF NOBSJGGEZMO AROUND ANUS AND VAGINA;  Surgeon: Michael Boston, MD;  Location: [place];  Service: General;  Laterality: N/A;  . OOPHORECTOMY    . PORTA CATH INSERTION N/A 05/13/2018   Procedure: PORTA CATH INSERTION;  Surgeon: Katha Cabal, MD;  Location: [place] [facility];  Service: Cardiovascular;  Laterality: N/A;  . SMALL INTESTINE SURGERY    . TANDEM RING INSERTION     x3  . THORACOTOMY    . TOTAL KNEE ARTHROPLASTY Left 04/24/2018   Procedure: TOTAL KNEE ARTHROPLASTY;  Surgeon: Lovell Sheehan, MD;  Location: [facility] ORS;  Service: Orthopedics;  Laterality: Left;    Family History  Problem Relation Age of Onset  . Hypertension Father   . Diabetes Father   . Alcohol abuse Daughter   . Hypertension Maternal Grandmother   . Diabetes Maternal Grandmother   . Diabetes Paternal Grandmother   . Hypertension Paternal Grandmother     Social History:  reports that she quit smoking about 11 years ago. She has never used smokeless tobacco. She reports current alcohol use. She reports current drug use. Drug: Marijuana.  The patient's 28 year old daughter died  of alcoholism.  The patient is originally from [place].  She moved from [place] to [place]. She is alone today.   Allergies:  Allergies  Allergen Reactions  . Ketamine Anxiety and Other (See Comments)    Syncope episode/confusion     Current Medications: No current facility-administered medications for this visit.    Current Outpatient Medications  Medication Sig Dispense Refill  . amitriptyline (ELAVIL) 50 MG tablet Take 1 tablet (50 mg total) by mouth at bedtime. 30 tablet 2  . Calcium Carb-Cholecalciferol (CALCIUM 500 +D) 500-400 MG-UNIT TABS Take 2 tablets by mouth daily.    . colestipol (COLESTID) 1 g tablet Take 2 tablets (2 g total) by mouth 3 (three) times daily with meals. 180 tablet 0  . cyanocobalamin (,VITAMIN B-12,) 1000 MCG/ML injection Inject 1ML every week x 4 weeks, then 1ML  every other week x 2 months, then monthly x 3 months (Patient taking differently: Inject 1,000 mcg into the muscle every 30 (thirty) days. Inject 1ML every week x 4 weeks, then 1ML every other week x 2 months, then monthly x 3 months) 10 mL 1  . Eluxadoline (VIBERZI) 100 MG TABS Take 1 tablet (100 mg total) by mouth 2 (two) times daily with a meal. 60 tablet 0  . HYDROcodone-acetaminophen (NORCO/VICODIN) 5-325 MG tablet Take 1 tablet by mouth every 6 (six) hours as needed for moderate pain. 60 tablet 0  . Multiple Vitamins-Minerals (MULTIVITAMIN WITH MINERALS) tablet Take 1 tablet by mouth daily. 30 tablet 0  . OLANZapine zydis (ZYPREXA) 5 MG disintegrating tablet Take 1 tablet (5 mg total) by mouth at bedtime. 30 tablet 0  . ondansetron (ZOFRAN) 8 MG tablet Take 1 tablet (8 mg total) by mouth every 8 (eight) hours as needed for nausea or vomiting. 30 tablet 3  . pantoprazole (PROTONIX) 40 MG tablet Take 1 tablet (40 mg total) by mouth daily. 30 tablet 0  . phenazopyridine (PYRIDIUM) 200 MG tablet Take 1 tablet (200 mg total) by mouth 3 (three) times daily as needed for pain. 10  tablet 0  . promethazine (PHENERGAN) 25 MG tablet Take 1 tablet (25 mg total) by mouth every 6 (six) hours as needed for nausea or vomiting. 30 tablet 0  . rivaroxaban (XARELTO) 20 MG TABS tablet Take 1 tablet (20 mg total) by mouth daily with supper. 30 tablet 3  . Syringe/Needle, Disp, (SYRINGE 3CC/22GX1") 22G X 1" 3 ML MISC For use with Vitamin B12 injections 50 each 0  . traZODone (DESYREL) 50 MG tablet Take 0.5-1 tablets (25-50 mg total) by mouth at bedtime as needed for sleep. 30 tablet 0  . VENTOLIN HFA 108 (90 Base) MCG/ACT inhaler Inhale 1-2 puffs into the lungs every 4 (four) hours as needed for shortness of breath.   0   Facility-Administered Medications Ordered in Other Visits  Medication Dose Route Frequency Provider Last Rate Last Dose  . heparin lock flush 100 unit/mL  500 Units Intravenous Once Corcoran, Melissa C, MD      . heparin lock flush 100 unit/mL  500 Units Intravenous Once Corcoran, Melissa C, MD      . sodium chloride 0.9 % 1,000 mL with potassium chloride 20 mEq infusion   Intravenous Once Karen Kitchens, NP         Review of Systems  Constitutional: Positive for malaise/fatigue. Negative for chills, diaphoresis, fever and weight loss (no new weight).       Increased fatigue.  HENT: Negative.  Negative for congestion, ear discharge, ear pain, hearing loss, nosebleeds, sinus pain, sore throat and tinnitus.   Eyes: Negative.  Negative for blurred vision, double vision, photophobia, pain, discharge and redness.  Respiratory: Positive for shortness of breath. Negative for cough, hemoptysis, sputum production and wheezing.   Cardiovascular: Negative.  Negative for chest pain, palpitations, orthopnea, leg swelling and PND.  Gastrointestinal: Positive for abdominal pain (disomfort with bowel movements), blood in stool, diarrhea (increased) and nausea (well controlled). Negative for constipation, heartburn, melena and vomiting.  Genitourinary: Negative.  Negative for  hematuria.       Recent UTI symptoms.  Musculoskeletal: Negative for back pain, joint pain, myalgias and neck pain.  Skin: Negative.  Negative for itching and rash.  Neurological: Positive for dizziness. Negative for tingling, tremors, sensory change, speech change, focal weakness, weakness and headaches.  Endo/Heme/Allergies:  Rectal bleeding (new).  Psychiatric/Behavioral: Negative for depression and memory loss. The patient is not nervous/anxious and does not have insomnia.   All other systems reviewed and are negative.  Performance status (ECOG): 2  Vital Signs There were no vitals taken for this visit.  Physical Exam  Constitutional: She is oriented to person, place, and time and well-developed, well-nourished, and in no distress. No distress.  HENT:  Head: Normocephalic and atraumatic.  Mouth/Throat: Oropharynx is clear and moist and mucous membranes are normal. No oropharyngeal exudate.  Wearing a head wrap.  Eyes: Pupils are equal, round, and reactive to light. Conjunctivae and EOM are normal. Right eye exhibits no discharge. Left eye exhibits no discharge. No scleral icterus.  Blue eyes.  Neck: Normal range of motion. Neck supple. No JVD present.  Cardiovascular: Normal rate, regular rhythm, normal heart sounds and intact distal pulses. Exam reveals no gallop and no friction rub.  No murmur heard. Pulmonary/Chest: Effort normal and breath sounds normal. No respiratory distress. She has no wheezes. She has no rales.  Abdominal: Soft. Bowel sounds are normal. She exhibits no distension. There is generalized abdominal tenderness (slight lower abdominal tenderness on palpation). There is no rebound and no guarding.  Musculoskeletal: Normal range of motion.        General: No tenderness or edema.  Lymphadenopathy:    She has no cervical adenopathy.    She has no axillary adenopathy.       Right: No supraclavicular adenopathy present.       Left: No supraclavicular  adenopathy present.  Neurological: She is alert and oriented to person, place, and time.  Skin: Skin is warm and dry. No rash noted. She is not diaphoretic. No erythema. No pallor.  Psychiatric: Mood, affect and judgment normal.  Nursing note and vitals reviewed.   Imaging studies: 06/2017:  PET scan revealed enlarging pulmonary nodules but no evidence of abdominal disease per patient report. 09/13/2017:  Abdomen and pelvic CT revealed a small infarct in the inferior pole of the LEFT kidney and RIGHT uterine infarct. 11/08/2017:  PET scan at the [facility] revealed innumerable stable bilateral cavitary pulmonary nodules (up to 8 mm), some of which demonstrate low level metabolic activity.  There were post treatment changes in the pelvis without evidence of suspicious hypermetabolic activity. 12/03/2017:  Abdomen and pelvic CT revealed continued multiple pulmonary nodules in the lung bases concerning for metastatic disease.  There was mild wall thickening of rectosigmoid colon is noted.  There was a stable 1.8 x 1.6 cm mixed fat and soft tissue density noted in right lower quadrant which may represent fat necrosis.  There was a larger low density is noted involving uterine fundus.  12/03/2017:  Pelvic MRI was unremarkable.   04/15/2018:  Chest, abdomen, and pelvis CT revealed innumerable (> 100) cavitary nodules scattered in the lungs, moderately enlarging compared to the 11/08/2017 PET-CT, suspicious for metastatic disease.  One index node in the RLL measures 1.0 x 1.1 cm (previously 0.6 x 0.6 cm).  There were no new nodules.  There was an ill-defined wall thickening in the rectosigmoid with surrounding stranding along fascia planes, probably sequela from prior radiation therapy.  There was multilevel lumbar impingement due to spondylosis and degenerative disc disease.  There was heterogeneous enhancement in the uterus (some possibly from prior radiation therapy). 04/23/2018:   PET scan revealed numerous scattered solid and cavitary nodules in the lungs stable increased in size compared to the prior PET-CT from  11/08/2017. Largest nodule was 1.1 cm in the LUL (SUV 1.9).  These demonstrated low-grade metabolic activity up to a maximum SUV of about 2.3, increased from 11/08/2017.  05/23/2018:  Abdomen and pelvic CT revealed colonic wall thickening, consistent with colitis. Considerations include inflammatory or infectious causes. Ischemic causes would be less likely. She is s/p RIGHT hemicolectomy. She had a patent ileocolic anastomosis.  She received a 10 day course of ciprofloxacin and Flagyl.   09/07/2018:  Abdomen and pelvic CT angiogram revealed colitis, mainly affecting lower loops, question radiation colitis if there has been radiotherapy of the patient's cervical cancer.  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  09/30/2018:  Chest CT revealed interval decrease in size and number of metastatic pulmonary nodules.  There were no new/progressive findings in the chest or upper abdomen   Infusion on 10/08/2018  Component Date Value Ref Range Status  . Magnesium 10/08/2018 1.7  1.7 - 2.4 mg/dL Final   Performed at [facility], [address], [place], [place] [address]  . Sodium 10/08/2018 138  135 - 145 mmol/L Final  . Potassium 10/08/2018 3.3* 3.5 - 5.1 mmol/L Final  . Chloride 10/08/2018 108  98 - 111 mmol/L Final  . CO2 10/08/2018 25  22 - 32 mmol/L Final  . Glucose, Bld 10/08/2018 118* 70 - 99 mg/dL Final  . BUN 10/08/2018 13  6 - 20 mg/dL Final  . Creatinine, Ser 10/08/2018 0.66  0.44 - 1.00 mg/dL Final  . Calcium 10/08/2018 8.7* 8.9 - 10.3 mg/dL Final  . GFR calc non Af Amer 10/08/2018 >60  >60 mL/min Final  . GFR calc Af Amer 10/08/2018 >60  >60 mL/min Final  . Anion gap 10/08/2018 5  5 - 15 Final   Performed at [facility], [address], [place], [place] [address]  . WBC 10/08/2018 2.7* 4.0 - 10.5 K/uL Final  . RBC 10/08/2018 2.62* 3.87 - 5.11  MIL/uL Final  . Hemoglobin 10/08/2018 8.9* 12.0 - 15.0 g/dL Final  . HCT 10/08/2018 27.4* 36.0 - 46.0 % Final  . MCV 10/08/2018 104.6* 80.0 - 100.0 fL Final  . MCH 10/08/2018 34.0  26.0 - 34.0 pg Final  . MCHC 10/08/2018 32.5  30.0 - 36.0 g/dL Final  . RDW 10/08/2018 12.7  11.5 - 15.5 % Final  . Platelets 10/08/2018 81* 150 - 400 K/uL Final  . nRBC 10/08/2018 0.0  0.0 - 0.2 % Final  . Neutrophils Relative % 10/08/2018 66  % Final  . Neutro Abs 10/08/2018 1.8  1.7 - 7.7 K/uL Final  . Lymphocytes Relative 10/08/2018 19  % Final  . Lymphs Abs 10/08/2018 0.5* 0.7 - 4.0 K/uL Final  . Monocytes Relative 10/08/2018 11  % Final  . Monocytes Absolute 10/08/2018 0.3  0.1 - 1.0 K/uL Final  . Eosinophils Relative 10/08/2018 2  % Final  . Eosinophils Absolute 10/08/2018 0.1  0.0 - 0.5 K/uL Final  . Basophils Relative 10/08/2018 0  % Final  . Basophils Absolute 10/08/2018 0.0  0.0 - 0.1 K/uL Final  . WBC Morphology 10/08/2018 DIFF CONFIRMED BY MANUAL   Final  . Immature Granulocytes 10/08/2018 2  % Final  . Abs Immature Granulocytes 10/08/2018 0.04  0.00 - 0.07 K/uL Final   Performed at [facility], [address] [place],  [address]  Orders Only on 10/07/2018  Component Date Value Ref Range Status  . Color, Urine 10/07/2018 YELLOW* YELLOW Final  . APPearance 10/07/2018 CLEAR* CLEAR Final  . Specific  Gravity, Urine 10/07/2018 1.021  1.005 - 1.030 Final  . pH 10/07/2018 5.0  5.0 - 8.0 Final  . Glucose, UA 10/07/2018 NEGATIVE  NEGATIVE mg/dL Final  . Hgb urine dipstick 10/07/2018 NEGATIVE  NEGATIVE Final  . Bilirubin Urine 10/07/2018 NEGATIVE  NEGATIVE Final  . Ketones, ur 10/07/2018 NEGATIVE  NEGATIVE mg/dL Final  . Protein, ur 10/07/2018 30* NEGATIVE mg/dL Final  . Nitrite 10/07/2018 NEGATIVE  NEGATIVE Final  . Leukocytes, UA 10/07/2018 SMALL* NEGATIVE Final  . RBC / HPF 10/07/2018 0-5  0 - 5 RBC/hpf Final  . WBC, UA 10/07/2018 >50* 0 - 5 WBC/hpf Final  . Bacteria, UA  10/07/2018 NONE SEEN  NONE SEEN Final  . Squamous Epithelial / LPF 10/07/2018 NONE SEEN  0 - 5 Final  . Mucus 10/07/2018 PRESENT   Final   Performed at [facility], [address] [place], [place] [address]  . Specimen Description 10/07/2018    Final                   Value:URINE, CLEAN CATCH Performed at [facility], [address] [place], [place] [address]   . Special Requests 10/07/2018    Final                   Value:NONE Performed at [facility], [address] [place], [place] [address]   . Culture 10/07/2018    Final                   Value:NO GROWTH Performed at [facility], [place] [address] [place], [place] [address]   . Report Status 10/07/2018 10/08/2018 FINAL   Final  Orders Only on 10/07/2018  Component Date Value Ref Range Status  . Magnesium 10/07/2018 1.4* 1.7 - 2.4 mg/dL Final   Performed at [facility], [address] [place], [place] [address]  . Sodium 10/07/2018 138  135 - 145 mmol/L Final  . Potassium 10/07/2018 3.5  3.5 - 5.1 mmol/L Final  . Chloride 10/07/2018 104  98 - 111 mmol/L Final  . CO2 10/07/2018 24  22 - 32 mmol/L Final  . Glucose, Bld 10/07/2018 100* 70 - 99 mg/dL Final  . BUN 10/07/2018 16  6 - 20 mg/dL Final  . Creatinine, Ser 10/07/2018 0.77  0.44 - 1.00 mg/dL Final  . Calcium 10/07/2018 9.1  8.9 - 10.3 mg/dL Final  . GFR calc non Af Amer 10/07/2018 >60  >60 mL/min Final  . GFR calc Af Amer 10/07/2018 >60  >60 mL/min Final  . Anion gap 10/07/2018 10  5 - 15 Final   Performed at [facility], [address] [place], [place] [address]    Assessment:  RUBYE STROHMEYER is a 52 y.o. female with a history of stage IB1 cervical cancer s/p concurrent cisplatin and radiation (external beam and brachytherapy) from 11/2016 - 02/2017.    She was treated by Dr. Christene Slates at [facility] in [place], [place].  In 01/2017, she received cisplatin x 2 and carboplatin x 1  (01/29/2017) due to ARF and XRT. Radiation was followed by T & O on 02/01/2017 and T & N on 02/10/2017 and 02/20/2017.  Course was complicated by weight loss (80 pounds), nausea, vomiting, electrolyte wasting (potassium and magnesium), and multiple hospitalizations.  PET scan in 06/2017 revealed enlarging pulmonary nodules but no evidence of abdominal disease per patient report.  She is s/p right ileocolectomy on 08/31/2017  for a small bowel obstruction.  She presented with abdominal pain.  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 PDL-1 testing reveals a CPS score of 15.  Chest, abdomen, and pelvis CT on 04/15/2018 revealed innumerable (> 100) cavitary nodules scattered in the lungs, moderately enlarging compared to the 11/08/2017 PET-CT, suspicious for metastatic disease. One index node in the RLL measures 1.0 x 1.1 cm.  There were no new nodules.  There was an ill-defined wall thickening in the rectosigmoid with surrounding stranding along fascia planes, probably sequela from prior radiation therapy.  There was multilevel lumbar impingement due to spondylosis and degenerative disc disease.  There was heterogeneous enhancement in the uterus (some possibly from prior radiation therapy).  PET scan on 04/23/2018 revealed numerous scattered solid and cavitary nodules in the lungs stable increased in size compared to the prior PET-CT from 11/08/2017. Largest nodule was 1.1 cm in the LUL (SUV 1.9).  These demonstrated low-grade metabolic activity up to a maximum SUV of about 2.3, increased from 11/08/2017.   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  She day 9 s/p cycle # 5 cycle of carboplatin and Taxol (06/04/2018 - 09/30/2018) with Margarette [place] support.    Chest CT  on 09/30/2018 revealed interval decrease in size and number of metastatic pulmonary nodules.  There were no new/progressive findings in the chest or upper abdomen  Hypercoagulable work-up on 09/14/2017 revealed heterozygosity for Factor V Leiden (single [id number] mutation).  Testing was negative for prothrombin gene mutation, lupus anticoagulant panel, anticardiolipin antibodies, and beta-2 glycoprotein antibodies.  She was discharged on Xarelto.  She was admitted to [facility] from 12/03/2017 - 12/08/2017 with abdominal pain. She was not felt to have a uterine infarct.  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  The etiology was unclear.  She is hepatitis C positive.   Hepatitis C genotype is 2a/2c.  She has B12 deficiency.  B12 was 165 on 11/27/2017.  She began B12 injections on 11/29/2017.  B12 injections occur monthly at home.  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  She was diagnosed with colitis.  Abdomen and pelvic CT on 05/23/2018 revealed colonic wall thickening, consistent with colitis. Considerations include inflammatory or infectious causes. Ischemic causes would be less likely. She is s/p RIGHT hemicolectomy. She had a patent ileocolic anastomosis.  She received a 10 day course of ciprofloxacin and Flagyl.  Abdomen and pelvic CT angiogram on 09/07/2018 revealed colitis, mainly affecting lower loops, question radiation colitis if there has been radiotherapy of the patient's cervical cancer.  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   She has  chronic diarrhea s/p right hemicolectomy.  She is on colestipol, budesonide, amitriptyline, and Viberzi.  She has adrenal insufficiency.  She is on hydrocortisone.  Symptomatically, she has nausea, diarrhea, and new rectal bleeding.  Hemoglobin has dropped from 13.0 to 8.9 in the past 4 days.  Platelet count is 81,000  Plan: 1. Labs today:  CBC with diff, bMP, Mg. 2. Metastatic cervical cancer: Day 9 s/p cycle #5 carboplatin  and Taxol with Neulasta support. Imaging reveals responsive disease. 3. Adrenal insufficiency: Continue hydrocortisone. If issues with BP, may need stress dose steroids. 4. Nausea, vomiting, and diarrhea:  Continue symptom managament.  IVF and electrolyte replacement.  Patient followed by Dr. Marius Ditch, GI.  5. Rectal bleeding (new): Etiology unclear. Patient has a history of hemorrhoids and likely rectal irritation by diarrhea. Concern for significant drop in hemoglobin. Type and screen.  Serial hemoglobins. Hold anticoagulation. 6.  Patient transferred to the emergency for likely admission.   Lequita Asal, MD  10/08/2018, 10:04 AM

## 2018-10-08 NOTE — ED Notes (Signed)
Floor called to give report, floor unable to take report at this time

## 2018-10-08 NOTE — Progress Notes (Signed)
ED not answering phone to give report.

## 2018-10-08 NOTE — Progress Notes (Signed)
Per Honor Loh NP may run 33mq K over one hour. Patient has port.  Patient Hbg 8.9, dropped from 13 on 12/20. Patient c/o of rectal bleeding, platelets 81 today. Per Dr CMike Gippatient needs to go to ED for evaluation. Patient verbalized understanding. Patient agreed, asking if she can drive herself over to ED. Per BHonor Loh NP okay for patient to drive herself. Patient ambulated out of office without difficulty.

## 2018-10-08 NOTE — ED Notes (Signed)
ED TO INPATIENT HANDOFF REPORT  Name/Age/Gender Joanna Hall 52 y.o. female  Code Status Code Status History    Date Active Date Inactive Code Status Order ID Comments User Context   07/27/2018 1621 08/01/2018 1432 Full Code [id number]  Demetrios Loll, MD Inpatient   06/10/2018 1754 06/12/2018 1614 Full Code [id number]  Dustin Flock, MD Inpatient   04/24/2018 1451 04/27/2018 1700 Full Code [id number]  Lovell Sheehan, MD Inpatient   12/03/2017 1619 12/08/2017 1606 Full Code [id number]  Epifanio Lesches, MD ED   09/12/2017 0024 09/15/2017 1721 Full Code [id number]  [name], MD ED   08/27/2017 0223 09/05/2017 1355 Full Code [id number]  Herbert Pun, MD Inpatient    Advance Directive Documentation     Most Recent Value  Type of Advance Directive  Healthcare Power of [place], Living will  Pre-existing out of facility DNR order (yellow form or pink MOST form)  -  "MOST" Form in Place?  -      Home/SNF/Other Home HOME  Chief Complaint sent by dr;blood transfusion  Level of Care/Admitting Diagnosis ED Disposition    ED Disposition Condition [place]: [facility] [100120]  Level of Care: Med-Surg [16]  Diagnosis: GI bleed [462703]  Admitting Physician: Nicholes Mango [[id number]]  Attending Physician: Nicholes Mango [[id number]]  Estimated length of stay: 3 - 4 days  Certification:: I certify this patient will need inpatient services for at least 2 midnights  Bed request comments: 2c  PT Class (Do Not Modify): Inpatient [101]  PT Acc Code (Do Not Modify): Private [1]       Medical History Past Medical History:  Diagnosis Date  . Abnormal cervical Papanicolaou smear 09/18/2017  . Anxiety   . Aortic atherosclerosis ([place])   . Arthritis    neck and knees  . 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 Blood transfusion without reported diagnosis   . Cervical cancer (HCC)    mets lung  . Chronic anal fissure   . Chronic  diarrhea   . Dyspnea   . Erosive gastropathy 09/18/2017  . Factor V Leiden mutation ([place])   . Fecal incontinence   . Genital warts   . GERD (gastroesophageal reflux disease)   . Heart murmur   . Hemorrhoids   . 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 Hepatitis, chronic ([place]) 05/05/2017  . History of cancer chemotherapy    completed 06/2017  . History of Clostridium difficile infection    while undergoing chemo.  Negative test 10/2017  . Ileocolic anastomotic leak   . 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 Intestinal infection due to Clostridium difficile 09/18/2017  . Macrocytic anemia with vitamin B12 deficiency   . Nausea vomiting and diarrhea   . Pancolitis ([place]) 07/27/2018  . Perianal condylomata   . Pneumonia    History of  . Pulmonary nodules   . Rectal bleeding   . Small bowel obstruction ([place]) 08/2017  . Stiff neck    limited right turn  . Vitamin D deficiency     Allergies Allergies  Allergen Reactions  . Ketamine Anxiety and Other (See Comments)    Syncope episode/confusion     IV Location/Drains/Wounds Patient Lines/Drains/Airways Status   Active Line/Drains/Airways    Name:   Placement date:   Placement time:   Site:   Days:   Implanted  Port 06/10/18 Right Chest   06/10/18    1830    Chest   120          Labs/Imaging Results for orders placed or performed during the hospital encounter of 10/08/18 (from the past 48 hour(s))  Type and screen     Status: None (Preliminary result)   Collection Time: 10/08/18 11:26 AM  Result Value Ref Range   ABO/RH(D) O POS    Antibody Screen NEG    Sample Expiration 10/11/2018    Unit Number [id number]    Blood Component Type RED CELLS,LR    Unit division 00    Status of Unit ISSUED    Transfusion Status OK TO TRANSFUSE    Crossmatch Result      Compatible Performed at [facility], [address] [place], [place] [address]    Unit Number [id number]     Blood Component Type RED CELLS,LR    Unit division 00    Status of Unit ALLOCATED    Transfusion Status OK TO TRANSFUSE    Crossmatch Result Compatible   Prepare RBC     Status: None   Collection Time: 10/08/18 11:26 AM  Result Value Ref Range   Order Confirmation      ORDER PROCESSED BY BLOOD BANK Performed at [facility], [address] [place], [place] [address]    No results found.  Pending Labs [employer] (From admission, onward)    Start     Ordered   10/08/18 1225  C Difficile Quick Screen w PCR reflex  Once,   R     10/08/18 1225   Signed and Held  HIV antibody (Routine Testing)  Once,   R     Signed and Held   Signed and Held  Hemoglobin and hematocrit, blood  Now then every 6 hours,   STAT     Signed and Held   Signed and Held  Comprehensive metabolic panel  Tomorrow morning,   R     Signed and Held   Signed and Held  CBC  Tomorrow morning,   R     Signed and Held   Signed and Held  Protime-INR  Tomorrow morning,   R     Signed and Held          Vitals/Pain Today's Vitals   10/08/18 1130 10/08/18 1238 10/08/18 1300 10/08/18 1327  BP: 126/78  125/78 119/68  Pulse: 78  77 75  Resp: 20  17 (!) 22  Temp:   99.3 F (37.4 C) 98.1 F (36.7 C)  TempSrc:   Oral Oral  SpO2: 100%  100% 100%  Weight:      Height:      PainSc:  6       Isolation Precautions Enteric precautions (UV disinfection)  Medications Medications  0.9 %  sodium chloride infusion (10 mL/hr Intravenous New Bag/Given 10/08/18 1132)  morphine 4 MG/ML injection 4 mg (4 mg Intravenous Given 10/08/18 1130)  ondansetron (ZOFRAN) injection 4 mg (4 mg Intravenous Given 10/08/18 1129)    Mobility Independent

## 2018-10-08 NOTE — H&P (Signed)
[place] [facility] [place] NAME: Joanna Hall    MR#:  [id number]  DATE OF BIRTH:  10/11/1966  DATE OF ADMISSION:  10/08/2018  PRIMARY CARE PHYSICIAN: Arnetha Courser, MD   REQUESTING/REFERRING PHYSICIAN: dr.Corcora,  CHIEF COMPLAINT:   Gi bleed  HISTORY OF PRESENT ILLNESS:  Joanna Hall  is a 52 y.o. female with a known history of stage IV cervical cancer with lung mets on chemotherapy with carboplatin and Taxol Sees Dr. Mike Gip as an outpatient, chronic diarrhea, essential hypertension, hepatitis C seen by Dr. Mike Gip today and as patient was reporting lower GI bleed associated with dizziness and l lightheadedness when standing.  Patient hemoglobin trended down from 13-8.9.  Patient is sent over to the hospital ED for further management.  Patient is reporting chronic abdominal pain, sees Dr. Marius Ditch as an outpatient  PAST MEDICAL HISTORY:   Past Medical History:  Diagnosis Date  . Abnormal cervical Papanicolaou smear 09/18/2017  . Anxiety   . Aortic atherosclerosis ([place])   . Arthritis    neck and knees  . 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 Blood transfusion without reported diagnosis   . Cervical cancer (HCC)    mets lung  . Chronic anal fissure   . Chronic diarrhea   . Dyspnea   . Erosive gastropathy 09/18/2017  . Factor V Leiden mutation ([place])   . Fecal incontinence   . Genital warts   . GERD (gastroesophageal reflux disease)   . Heart murmur   . Hemorrhoids   . 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 Hepatitis, chronic ([place]) 05/05/2017  . History of cancer chemotherapy    completed 06/2017  . History of Clostridium difficile infection    while undergoing chemo.  Negative test 10/2017  . Ileocolic anastomotic leak   . 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 Intestinal infection due to Clostridium difficile 09/18/2017  . Macrocytic anemia with vitamin  B12 deficiency   . Nausea vomiting and diarrhea   . Pancolitis ([place]) 07/27/2018  . Perianal condylomata   . Pneumonia    History of  . Pulmonary nodules   . Rectal bleeding   . Small bowel obstruction ([place]) 08/2017  . Stiff neck    limited right turn  . Vitamin D deficiency     PAST SURGICAL HISTOIRY:   Past Surgical History:  Procedure Laterality Date  . CHOLECYSTECTOMY    . COLON SURGERY  08/2017   resection  . COLONOSCOPY WITH PROPOFOL N/A 12/20/2017   Procedure: COLONOSCOPY WITH PROPOFOL;  Surgeon: [name], MD;  Location: [facility] ENDOSCOPY;  Service: Gastroenterology;  Laterality: N/A;  . COLONOSCOPY WITH PROPOFOL N/A 07/30/2018   Procedure: COLONOSCOPY WITH PROPOFOL;  Surgeon: [name], MD;  Location[facility] ENDOSCOPY;  Service: Gastroenterology;  Laterality: N/A;  . DIAGNOSTIC LAPAROSCOPY    . ESOPHAGOGASTRODUODENOSCOPY (EGD) WITH PROPOFOL N/A 12/20/2017   Procedure: ESOPHAGOGASTRODUODENOSCOPY (EGD) WITH PROPOFOL;  Surgeon: [name], MD;  Location: [facility];  Service: Gastroenterology;  Laterality: N/A;  . ESOPHAGOGASTRODUODENOSCOPY (EGD) WITH PROPOFOL  07/30/2018   Procedure: ESOPHAGOGASTRODUODENOSCOPY (EGD) WITH PROPOFOL;  Surgeon: [name], MD;  Location: [facility] ENDOSCOPY;  Service: Gastroenterology;;  . LAPAROTOMY N/A 08/31/2017   Procedure: EXPLORATORY LAPAROTOMY for SBO, ileocolectomy, removal of piece of uterine wall;  Surgeon: [name], MD;  Location: [facility] ORS;  Service: General;  Laterality: N/A;  . LASER ABLATION CONDOLAMATA N/A 02/22/2018   Procedure: LASER ABLATION/REMOVAL OF CONDOLAMATA AROUND ANUS AND VAGINA;  Surgeon: Michael Boston, MD;  Location: [facility];  Service: General;  Laterality: N/A;  . OOPHORECTOMY    . PORTA CATH INSERTION N/A 05/13/2018   Procedure: PORTA CATH INSERTION;  Surgeon: Katha Cabal, MD;  Location: [facility] CV LAB;  Service: Cardiovascular;  Laterality: N/A;  .  SMALL INTESTINE SURGERY    . TANDEM RING INSERTION     x3  . THORACOTOMY    . TOTAL KNEE ARTHROPLASTY Left 04/24/2018   Procedure: TOTAL KNEE ARTHROPLASTY;  Surgeon: Lovell Sheehan, MD;  Location: [facility] ORS;  Service: Orthopedics;  Laterality: Left;    SOCIAL HISTORY:   Social History   Tobacco Use  . Smoking status: Former Smoker    Last attempt to quit: 10/16/2006    Years since quitting: 11.9  . Smokeless tobacco: Never Used  Substance Use Topics  . Alcohol use: Yes    Frequency: Never    Comment: seldom    FAMILY HISTORY:   Family History  Problem Relation Age of Onset  . Hypertension Father   . Diabetes Father   . Alcohol abuse Daughter   . Hypertension Maternal Grandmother   . Diabetes Maternal Grandmother   . Diabetes Paternal Grandmother   . Hypertension Paternal Grandmother     DRUG ALLERGIES:   Allergies  Allergen Reactions  . Ketamine Anxiety and Other (See Comments)    Syncope episode/confusion     REVIEW OF SYSTEMS:  CONSTITUTIONAL: No fever, fatigue or weakness.  EYES: No blurred or double vision.  EARS, NOSE, AND THROAT: No tinnitus or ear pain.  RESPIRATORY: No cough, shortness of breath, wheezing or hemoptysis.  CARDIOVASCULAR: No chest pain, orthopnea, edema.  GASTROINTESTINAL: No nausea, vomiting, reporting chronic diarrhea, lower abdominal pain, now with rectal bleed GENITOURINARY: No dysuria, hematuria.  ENDOCRINE: No polyuria, nocturia,  HEMATOLOGY: No anemia, easy bruising or bleeding SKIN: No rash or lesion. MUSCULOSKELETAL: No joint pain or arthritis.   NEUROLOGIC: No tingling, numbness, weakness.  PSYCHIATRY: No anxiety or depression.   MEDICATIONS AT HOME:   Prior to Admission medications   Medication Sig Start Date End Date Taking? Authorizing Provider  amitriptyline (ELAVIL) 50 MG tablet Take 1 tablet (50 mg total) by mouth at bedtime. 08/19/18 10/08/18 Yes Vanga, Tally Due, MD  Eluxadoline (VIBERZI) 100 MG TABS Take 1  tablet (100 mg total) by mouth 2 (two) times daily with a meal. 08/16/18  Yes Verlon Au, NP  HYDROcodone-acetaminophen (NORCO/VICODIN) 5-325 MG tablet Take 1 tablet by mouth every 6 (six) hours as needed for moderate pain. 10/04/18  Yes Burns, Wandra Feinstein, NP  hydrocortisone (CORTEF) 10 MG tablet Take 10 mg by mouth 3 (three) times daily.   Yes [provider]  Multiple Vitamins-Minerals (MULTIVITAMIN WITH MINERALS) tablet Take 1 tablet by mouth daily. 08/01/18 08/01/19 Yes Wieting, Richard, MD  OLANZapine zydis (ZYPREXA) 5 MG disintegrating tablet Take 1 tablet (5 mg total) by mouth at bedtime. 08/01/18  Yes Wieting, Richard, MD  ondansetron (ZOFRAN) 8 MG tablet Take 1 tablet (8 mg total) by mouth every 8 (eight) hours as needed for nausea or vomiting. 08/23/18  Yes Karen Kitchens, NP  pantoprazole (PROTONIX) 40 MG tablet Take 1 tablet (40 mg total) by mouth daily. 08/01/18  Yes Wieting, Richard, MD  phenazopyridine (PYRIDIUM) 200 MG tablet Take 1 tablet (200 mg total) by  mouth 3 (three) times daily as needed for pain. 10/07/18  Yes Jacquelin Hawking, NP  promethazine (PHENERGAN) 25 MG tablet Take 1 tablet (25 mg total) by mouth every 6 (six) hours as needed for nausea or vomiting. 10/02/18  Yes Karen Kitchens, NP  rivaroxaban (XARELTO) 20 MG TABS tablet Take 1 tablet (20 mg total) by mouth daily with supper. 08/27/18  Yes Karen Kitchens, NP  Calcium Carb-Cholecalciferol (CALCIUM 500 +D) 500-400 MG-UNIT TABS Take 2 tablets by mouth daily.    [provider]  colestipol (COLESTID) 1 g tablet Take 2 tablets (2 g total) by mouth 3 (three) times daily with meals. Patient not taking: Reported on 10/08/2018 08/01/18   Loletha Grayer, MD  cyanocobalamin (,VITAMIN B-12,) 1000 MCG/ML injection Inject 1ML every week x 4 weeks, then 1ML every other week x 2 months, then monthly x 3 months Patient not taking: Reported on 10/08/2018 11/29/17   [name], MD  Syringe/Needle, Disp,  (SYRINGE 3CC/22GX1") 22G X 1" 3 ML MISC For use with Vitamin B12 injections 04/12/18   Vanga, Tally Due, MD  traZODone (DESYREL) 50 MG tablet Take 0.5-1 tablets (25-50 mg total) by mouth at bedtime as needed for sleep. Patient not taking: Reported on 10/08/2018 08/13/18   Karen Kitchens, NP  VENTOLIN HFA 108 (90 Base) MCG/ACT inhaler Inhale 1-2 puffs into the lungs every 4 (four) hours as needed for shortness of breath.     [provider]      VITAL SIGNS:  Blood pressure 113/70, pulse 70, temperature (!) 97 F (36.1 C), temperature source Oral, resp. rate 17, height 5' 8"  (1.727 m), weight 76.7 kg, SpO2 100 %.  PHYSICAL EXAMINATION:  GENERAL:  52 y.o.-year-old patient lying in the bed with no acute distress.  EYES: Pupils equal, round, reactive to light and accommodation. No scleral icterus. Extraocular muscles intact.  HEENT: Head atraumatic, normocephalic. Oropharynx and nasopharynx clear.  NECK:  Supple, no jugular venous distention. No thyroid enlargement, no tenderness.  LUNGS: Normal breath sounds bilaterally, no wheezing, rales,rhonchi or crepitation. No use of accessory muscles of respiration.  CARDIOVASCULAR: S1, S2 normal. No murmurs, rubs, or gallops.  ABDOMEN: Soft, diffuse lower abdominal tenderness but no rebound tenderness nondistended. Bowel sounds present.  EXTREMITIES: No pedal edema, cyanosis, or clubbing.  NEUROLOGIC: Awake, alert and oriented x3 sensation intact. Gait not checked.  PSYCHIATRIC: The patient is alert and oriented x 3.  SKIN: No obvious rash, lesion, or ulcer.   LABORATORY PANEL:   CBC Recent Labs  Lab 10/08/18 0813  WBC 2.7*  HGB 8.9*  HCT 27.4*  PLT 81*   ------------------------------------------------------------------------------------------------------------------  Chemistries  Recent Labs  Lab 10/04/18 0906  10/08/18 0802  NA 133*   < > 138  K 4.0   < > 3.3*  CL 98   < > 108  CO2 20*   < > 25  GLUCOSE 108*   < > 118*   BUN 13   < > 13  CREATININE 0.77   < > 0.66  CALCIUM 9.9   < > 8.7*  MG 1.6*   < > 1.7  AST 38  --   --   ALT 48*  --   --   ALKPHOS 208*  --   --   BILITOT 1.3*  --   --    < > = values in this interval not displayed.   ------------------------------------------------------------------------------------------------------------------  Cardiac Enzymes No results for input(s): TROPONINI in the last  168 hours. ------------------------------------------------------------------------------------------------------------------  RADIOLOGY:  No results found.  EKG:   Orders placed or performed during the hospital encounter of 06/10/18  . ED EKG  . ED EKG  . EKG    IMPRESSION AND PLAN:     Lower GI bleed-probably from chemo-induced colitis Admit to MedSurg unit Clear liquid diet Hemoglobin 13-8.9 today Close monitoring of the hemoglobin and hematocrit and transfuse as needed GI consult placed as patient was seen by Dr. Marius Ditch in the past Xarelto on hold Gentle hydration with IV fluids, PPI  Essential hypertension-blood pressure is low normal, hold antihypertensive Gentle hydration with IV fluids  Acute on chronic diarrhea- Check GI panel and C. difficile panel  Thrombocytopenia with platelet count 81,000 No active bleeding.  Continue close monitoring of the platelet count Xarelto on hold  DVT prophylaxis with SCDs  All the records are reviewed and case discussed with ED provider. Management plans discussed with the patient, family and they are in agreement.  CODE STATUS: fc   TOTAL TIME TAKING CARE OF THIS PATIENT: 43 minutes.   Note: This dictation was prepared with Dragon dictation along with smaller phrase technology. Any transcriptional errors that result from this process are unintentional.  Nicholes Mango M.D on 10/08/2018 at 5:21 PM  Between 7am to 6pm - Pager - [phone]  After 6pm go to [email or web address] - password [email or web address] [facility]   Office  [phone]  CC: Primary care physician; Arnetha Courser, MD

## 2018-10-08 NOTE — ED Provider Notes (Addendum)
[facility] Emergency Department Provider Note  ____________________________________________  Time seen: Approximately 11:32 AM  I have reviewed the triage vital signs and the nursing notes.   HISTORY  Chief Complaint Abnormal Lab   HPI Joanna Hall is a 52 y.o. female history of cervical cancer currently on chemotherapy, C. difficile infection, SBO, factor V Leiden on Xarelto who presents from her oncologist office for acute on chronic blood loss anemia.  Patient has had intermittent rectal bleeding for several months.  2 days ago had a larger episode of rectal bleeding and another one today.  She has been feeling very weak and went to see her doctor today.  She was found to have drop in her hemoglobin from 13 to 8.9 in 5 days. No diarrhea. She has diffuse abdominal pain which is chronic for her.  She denies fever, chills, dysuria, vomiting.  She has had nausea which is also chronic for her.  Has received several blood transfusions in the past.  Past Medical History:  Diagnosis Date  . Abnormal cervical Papanicolaou smear 09/18/2017  . Anxiety   . Aortic atherosclerosis ([place])   . Arthritis    neck and knees  . 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 Blood transfusion without reported diagnosis   . Cervical cancer (HCC)    mets lung  . Chronic anal fissure   . Chronic diarrhea   . Dyspnea   . Erosive gastropathy 09/18/2017  . Factor V Leiden mutation ([place])   . Fecal incontinence   . Genital warts   . GERD (gastroesophageal reflux disease)   . Heart murmur   . Hemorrhoids   . 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 Hepatitis, chronic ([place]) 05/05/2017  . History of cancer chemotherapy    completed 06/2017  . History of Clostridium difficile infection    while undergoing chemo.  Negative test 10/2017  . Ileocolic anastomotic leak   . 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 Intestinal  infection due to Clostridium difficile 09/18/2017  . Macrocytic anemia with vitamin B12 deficiency   . Nausea vomiting and diarrhea   . Pancolitis ([place]) 07/27/2018  . Perianal condylomata   . Pneumonia    History of  . Pulmonary nodules   . Rectal bleeding   . Small bowel obstruction ([place]) 08/2017  . Stiff neck    limited right turn  . Vitamin D deficiency     Patient Active Problem List   Diagnosis Date Noted  . Adrenal insufficiency ([place]) 09/03/2018  . Chronic diarrhea 06/25/2018  . Chronic anticoagulation 06/25/2018  . Vomiting   . Abdominal pain 06/10/2018  . Encounter for antineoplastic chemotherapy 06/04/2018  . 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 Lung metastasis ([place]) 05/15/2018  . Closed fracture of distal end of radius 05/04/2018  . History of total knee arthroplasty 04/24/2018  . Osteoarthritis of left knee 02/28/2018  . Condyloma acuminatum of anus s/p ablation 02/22/2018 02/22/2018  . Condyloma acuminatum of vagina s/p ablation 02/22/2018 02/22/2018  . Positive ANA (antinuclear antibody) 02/04/2018  . Aortic atherosclerosis ([place]) 01/15/2018  . Elevated MCV 01/15/2018  . Anemia 01/15/2018  . Genital warts 01/15/2018  . Vitamin D deficiency 01/15/2018  . Pernicious anemia   . Impingement syndrome of shoulder region 12/19/2017  . Multiple joint pain 12/19/2017  . Osteoarthritis of right knee 12/19/2017  . B12 deficiency  12/11/2017  . Lung nodules 12/11/2017  . History of Clostridium difficile infection 10/23/2017  . Factor V Leiden ([place]) 10/19/2017  . Goals of care, counseling/discussion 10/19/2017  . Cervical cancer, FIGO stage IB1 ([place]) 09/18/2017  . Chronic hepatitis C without hepatic coma ([place]) 09/18/2017  . Cytopenia 09/18/2017  . Diarrhea 09/18/2017  . Lower abdominal pain 09/18/2017  . 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 Malignant neoplasm of overlapping sites of cervix ([place]) 09/18/2017  . Renal insufficiency 09/18/2017  . Wound infection after  surgery 09/12/2017  . Hypokalemia   . Hypomagnesemia   . Cervical arthritis 07/18/2017  . Dysuria 06/20/2017  . Metastatic cancer ([place]) 05/12/2017  . Essential hypertension 03/15/2017  . Anemia in other chronic diseases classified elsewhere 03/01/2017  . Chemotherapy-induced neutropenia ([place]) 01/29/2017  . 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 CHOLECYSTECTOMY    . COLON SURGERY  08/2017   resection  . COLONOSCOPY WITH PROPOFOL N/A 12/20/2017   Procedure: COLONOSCOPY WITH PROPOFOL;  Surgeon: [name], MD;  Location: [facility] ENDOSCOPY;  Service: Gastroenterology;  Laterality: N/A;  . COLONOSCOPY WITH PROPOFOL N/A 07/30/2018   Procedure: COLONOSCOPY WITH PROPOFOL;  Surgeon: [name], MD;  Location: [facility] ENDOSCOPY;  Service: Gastroenterology;  Laterality: N/A;  . DIAGNOSTIC LAPAROSCOPY    . ESOPHAGOGASTRODUODENOSCOPY (EGD) WITH PROPOFOL N/A 12/20/2017   Procedure: ESOPHAGOGASTRODUODENOSCOPY (EGD) WITH PROPOFOL;  Surgeon: [name], MD;  Location: [place];  Service: Gastroenterology;  Laterality: N/A;  . ESOPHAGOGASTRODUODENOSCOPY (EGD) WITH PROPOFOL  07/30/2018   Procedure: ESOPHAGOGASTRODUODENOSCOPY (EGD) WITH PROPOFOL;  Surgeon: [name], MD;  Location: [facility] ENDOSCOPY;  Service: Gastroenterology;;  . LAPAROTOMY N/A 08/31/2017   Procedure: EXPLORATORY LAPAROTOMY for SBO, ileocolectomy, removal of piece of uterine wall;  Surgeon: [name], MD;  Location: [facility] ORS;  Service: General;  Laterality: N/A;  . LASER ABLATION CONDOLAMATA N/A 02/22/2018   Procedure: LASER ABLATION/REMOVAL OF [id number] AROUND ANUS AND VAGINA;  Surgeon: Michael Boston, MD;  Location: [place];  Service: General;  Laterality: N/A;  . OOPHORECTOMY    . PORTA CATH INSERTION N/A 05/13/2018   Procedure: PORTA CATH INSERTION;  Surgeon: Katha Cabal, MD;  Location: [place] CV LAB;   Service: Cardiovascular;  Laterality: N/A;  . SMALL INTESTINE SURGERY    . TANDEM RING INSERTION     x3  . THORACOTOMY    . TOTAL KNEE ARTHROPLASTY Left 04/24/2018   Procedure: TOTAL KNEE ARTHROPLASTY;  Surgeon: Lovell Sheehan, MD;  Location: [facility] ORS;  Service: Orthopedics;  Laterality: Left;    Prior to Admission medications   Medication Sig Start Date End Date Taking? Authorizing Provider  amitriptyline (ELAVIL) 50 MG tablet Take 1 tablet (50 mg total) by mouth at bedtime. 08/19/18 09/30/18  [name], MD  Calcium Carb-Cholecalciferol (CALCIUM 500 +D) 500-400 MG-UNIT TABS Take 2 tablets by mouth daily.    [provider]  colestipol (COLESTID) 1 g tablet Take 2 tablets (2 g total) by mouth 3 (three) times daily with meals. 08/01/18   Loletha Grayer, MD  cyanocobalamin (,VITAMIN B-12,) 1000 MCG/ML injection Inject 1ML every week x 4 weeks, then 1ML every other week x 2 months, then monthly x 3 months Patient taking differently: Inject 1,000 mcg into the muscle every 30 (thirty) days. Inject 1ML every week x 4 weeks, then 1ML every other week x 2 months, then monthly x 3 months 11/29/17   [name],  MD  Eluxadoline (VIBERZI) 100 MG TABS Take 1 tablet (100 mg total) by mouth 2 (two) times daily with a meal. 08/16/18   Verlon Au, NP  HYDROcodone-acetaminophen (NORCO/VICODIN) 5-325 MG tablet Take 1 tablet by mouth every 6 (six) hours as needed for moderate pain. 10/04/18   Jacquelin Hawking, NP  Multiple Vitamins-Minerals (MULTIVITAMIN WITH MINERALS) tablet Take 1 tablet by mouth daily. 08/01/18 08/01/19  Loletha Grayer, MD  OLANZapine zydis (ZYPREXA) 5 MG disintegrating tablet Take 1 tablet (5 mg total) by mouth at bedtime. 08/01/18   Loletha Grayer, MD  ondansetron (ZOFRAN) 8 MG tablet Take 1 tablet (8 mg total) by mouth every 8 (eight) hours as needed for nausea or vomiting. 08/23/18   Karen Kitchens, NP  pantoprazole (PROTONIX) 40 MG tablet Take 1 tablet  (40 mg total) by mouth daily. 08/01/18   Loletha Grayer, MD  phenazopyridine (PYRIDIUM) 200 MG tablet Take 1 tablet (200 mg total) by mouth 3 (three) times daily as needed for pain. 10/07/18   Jacquelin Hawking, NP  promethazine (PHENERGAN) 25 MG tablet Take 1 tablet (25 mg total) by mouth every 6 (six) hours as needed for nausea or vomiting. 10/02/18   Karen Kitchens, NP  rivaroxaban (XARELTO) 20 MG TABS tablet Take 1 tablet (20 mg total) by mouth daily with supper. 08/27/18   Karen Kitchens, NP  Syringe/Needle, Disp, (SYRINGE 3CC/22GX1") 22G X 1" 3 ML MISC For use with Vitamin B12 injections 04/12/18   Vanga, Tally Due, MD  traZODone (DESYREL) 50 MG tablet Take 0.5-1 tablets (25-50 mg total) by mouth at bedtime as needed for sleep. 08/13/18   Karen Kitchens, NP  VENTOLIN HFA 108 (90 Base) MCG/ACT inhaler Inhale 1-2 puffs into the lungs every 4 (four) hours as needed for shortness of breath.     [provider]    Allergies Ketamine  Family History  Problem Relation Age of Onset  . Hypertension Father   . Diabetes Father   . Alcohol abuse Daughter   . Hypertension Maternal Grandmother   . Diabetes Maternal Grandmother   . Diabetes Paternal Grandmother   . Hypertension Paternal Grandmother     Social History Social History   Tobacco Use  . Smoking status: Former Smoker    Last attempt to quit: 10/16/2006    Years since quitting: 11.9  . Smokeless tobacco: Never Used  Substance Use Topics  . Alcohol use: Yes    Frequency: Never    Comment: seldom  . Drug use: Yes    Types: Marijuana    Review of Systems  Constitutional: Negative for fever. Eyes: Negative for visual changes. ENT: Negative for sore throat. Neck: No neck pain  Cardiovascular: Negative for chest pain. Respiratory: Negative for shortness of breath. Gastrointestinal: Diffuse abdominal pain and nausea. No vomiting or diarrhea. Genitourinary: Negative for dysuria. + rectal bleeding Musculoskeletal:  Negative for back pain. Skin: Negative for rash. Neurological: Negative for headaches, weakness or numbness. Psych: No SI or HI  ____________________________________________   PHYSICAL EXAM:  VITAL SIGNS: ED Triage Vitals  Enc Vitals Group     BP 10/08/18 0930 124/88     Pulse Rate 10/08/18 0930 80     Resp 10/08/18 0930 20     Temp 10/08/18 0930 98.4 F (36.9 C)     Temp Source 10/08/18 0930 Oral     SpO2 10/08/18 0930 100 %     Weight 10/08/18 0931 169 lb (76.7 kg)  Height 10/08/18 0931 5' 8"  (1.727 m)     Head Circumference --      Peak Flow --      Pain Score 10/08/18 0940 7     Pain Loc --      Pain Edu? --      Excl. in [place]? --     Constitutional: Alert and oriented. Well appearing and in no apparent distress. HEENT:      Head: Normocephalic and atraumatic.         Eyes: Conjunctivae are normal. Sclera is non-icteric.       Mouth/Throat: Mucous membranes are moist.       Neck: Supple with no signs of meningismus. Cardiovascular: Regular rate and rhythm. No murmurs, gallops, or rubs. 2+ symmetrical distal pulses are present in all extremities. No JVD. Respiratory: Normal respiratory effort. Lungs are clear to auscultation bilaterally. No wheezes, crackles, or rhonchi.  Gastrointestinal: Soft, diffuse tenderness worse on the lower quadrants, and non distended with positive bowel sounds. No rebound or guarding. Musculoskeletal: Nontender with normal range of motion in all extremities. No edema, cyanosis, or erythema of extremities. Neurologic: Normal speech and language. Face is symmetric. Moving all extremities. No gross focal neurologic deficits are appreciated. Skin: Skin is warm, dry and intact. No rash noted. Psychiatric: Mood and affect are normal. Speech and behavior are normal.  ____________________________________________   LABS (all labs ordered are listed, but only abnormal results are displayed)  Labs Reviewed  C DIFFICILE QUICK SCREEN W PCR REFLEX   TYPE AND SCREEN  PREPARE RBC (CROSSMATCH)   ____________________________________________  EKG  none  ____________________________________________  RADIOLOGY  none  ____________________________________________   PROCEDURES  Procedure(s) performed: None Procedures Critical Care performed:  Yes  CRITICAL CARE Performed by: Rudene Re  ?  Total critical care time: 30 min  Critical care time was exclusive of separately billable procedures and treating other patients.  Critical care was necessary to treat or prevent imminent or life-threatening deterioration.  Critical care was time spent personally by me on the following activities: development of treatment plan with patient and/or surrogate as well as nursing, discussions with consultants, evaluation of patient's response to treatment, examination of patient, obtaining history from patient or surrogate, ordering and performing treatments and interventions, ordering and review of laboratory studies, ordering and review of radiographic studies, pulse oximetry and re-evaluation of patient's condition.  ____________________________________________   INITIAL IMPRESSION / ASSESSMENT AND PLAN / ED COURSE  52 y.o. female history of cervical cancer currently on chemotherapy, C. difficile infection, SBO, factor V Leiden on Xarelto who presents from her oncologist office for acute on chronic blood loss anemia in the setting of GI blood loss. Patient is currently hemodynamically stable.  Has had a four-point drop in her hemoglobin over the last 5 days.  Type and screen and 2 units of PRBCs have been ordered.  No melena or coffee-ground emesis therefore Protonix was not initiated.  Discussed with the hospitalist for admission.      As part of my medical decision making, I reviewed the following data within the [facility] notes reviewed and incorporated, Labs reviewed , Old chart reviewed, Discussed with  admitting physician , Notes from prior ED visits and [place] Controlled Substance Database    Pertinent labs & imaging results that were available during my care of the patient were reviewed by me and considered in my medical decision making (see chart for details).    ____________________________________________   FINAL CLINICAL  IMPRESSION(S) / ED DIAGNOSES  Final diagnoses:  Acute on chronic blood loss anemia  Hematochezia      NEW MEDICATIONS STARTED DURING THIS VISIT:  ED Discharge Orders    None       Note:  This document was prepared using Dragon voice recognition software and may include unintentional dictation errors.    Rudene Re, MD 10/08/18 [place], [place], MD 10/24/18 [id number]

## 2018-10-09 DIAGNOSIS — D62 Acute posthemorrhagic anemia: Secondary | ICD-10-CM

## 2018-10-09 DIAGNOSIS — C7800 Secondary malignant neoplasm of unspecified lung: Secondary | ICD-10-CM

## 2018-10-09 DIAGNOSIS — D72819 Decreased white blood cell count, unspecified: Secondary | ICD-10-CM

## 2018-10-09 DIAGNOSIS — K921 Melena: Secondary | ICD-10-CM

## 2018-10-09 DIAGNOSIS — D61818 Other pancytopenia: Secondary | ICD-10-CM

## 2018-10-09 DIAGNOSIS — C539 Malignant neoplasm of cervix uteri, unspecified: Secondary | ICD-10-CM

## 2018-10-09 LAB — BPAM RBC
Blood Product Expiration Date: 202001162359
Blood Product Expiration Date: 202001162359
ISSUE DATE / TIME: 201912241306
ISSUE DATE / TIME: 201912241658
Unit Type and Rh: 5100
Unit Type and Rh: 5100

## 2018-10-09 LAB — TYPE AND SCREEN
ABO/RH(D): O POS
Antibody Screen: NEGATIVE
UNIT DIVISION: 0
Unit division: 0

## 2018-10-09 LAB — CBC
HCT: 32.6 % — ABNORMAL LOW (ref 36.0–46.0)
Hemoglobin: 10.9 g/dL — ABNORMAL LOW (ref 12.0–15.0)
MCH: 33 pg (ref 26.0–34.0)
MCHC: 33.4 g/dL (ref 30.0–36.0)
MCV: 98.8 fL (ref 80.0–100.0)
Platelets: 67 10*3/uL — ABNORMAL LOW (ref 150–400)
RBC: 3.3 MIL/uL — ABNORMAL LOW (ref 3.87–5.11)
RDW: 16.5 % — ABNORMAL HIGH (ref 11.5–15.5)
WBC: 3.7 10*3/uL — ABNORMAL LOW (ref 4.0–10.5)
nRBC: 0 % (ref 0.0–0.2)

## 2018-10-09 LAB — COMPREHENSIVE METABOLIC PANEL
ALBUMIN: 3 g/dL — AB (ref 3.5–5.0)
ALT: 31 U/L (ref 0–44)
AST: 23 U/L (ref 15–41)
Alkaline Phosphatase: 128 U/L — ABNORMAL HIGH (ref 38–126)
Anion gap: 4 — ABNORMAL LOW (ref 5–15)
BUN: 7 mg/dL (ref 6–20)
CO2: 23 mmol/L (ref 22–32)
CREATININE: 0.61 mg/dL (ref 0.44–1.00)
Calcium: 8.4 mg/dL — ABNORMAL LOW (ref 8.9–10.3)
Chloride: 110 mmol/L (ref 98–111)
GFR calc Af Amer: >60 mL/min (ref >60)
GFR calc non Af Amer: >60 mL/min (ref >60)
GLUCOSE: 89 mg/dL (ref 70–99)
Potassium: 3.3 mmol/L — ABNORMAL LOW (ref 3.5–5.1)
Sodium: 137 mmol/L (ref 135–145)
Total Bilirubin: 0.5 mg/dL (ref 0.3–1.2)
Total Protein: 5.9 g/dL — ABNORMAL LOW (ref 6.5–8.1)

## 2018-10-09 LAB — PROTIME-INR
INR: 1.02
Prothrombin Time: 13.3 seconds (ref 11.4–15.2)

## 2018-10-09 MED ORDER — SODIUM CHLORIDE 0.9 % IV SOLN
INTRAVENOUS | Status: AC
  Administered 2018-10-09 – 2018-10-10 (×2): via INTRAVENOUS

## 2018-10-09 MED ORDER — ELUXADOLINE 100 MG PO TABS: 100.0000 mg | ORAL_TABLET | Freq: Two times a day (BID) | ORAL | Status: DC

## 2018-10-09 MED ORDER — POLYETHYLENE GLYCOL 3350 17 GM/SCOOP PO POWD
1.0000 | Freq: Once | ORAL | Status: AC
  Administered 2018-10-09: 255 g via ORAL
  Filled 2018-10-09: qty 255

## 2018-10-09 MED ORDER — POTASSIUM CHLORIDE CRYS ER 20 MEQ PO TBCR
40.0000 meq | EXTENDED_RELEASE_TABLET | Freq: Once | ORAL | Status: AC
  Administered 2018-10-09: 40 meq via ORAL
  Filled 2018-10-09: qty 2

## 2018-10-09 NOTE — Progress Notes (Signed)
Hematology/Oncology Consult note [facility]  Telephone:([phone] [phone]  Patient Care Team: Arnetha Courser, MD as PCP - General (Family Medicine) Mellody Drown, MD as Consulting Physician (Obstetrics and Gynecology) Lenor Coffin, MD as Attending Physician (Gynecology) Lequita Asal, MD as Medical Oncologist (Hematology and Oncology) [name], MD as Consulting Physician (Gastroenterology) Michael Boston, MD as Consulting Physician (General Surgery) Lovell Sheehan, MD as Consulting Physician (Orthopedic Surgery) Clent Jacks, RN as Registered Nurse   Name of the patient: Joanna Hall  [id number]  March 30, 1966   Date of visit: 10/09/2018  Interval history- she had 4 episodes of diarrhea this morning. One of them had burgundy color. No bright red blood seen  ECOG PS- 1 Pain scale- 0 Opioid associated constipation- no  Review of systems- Review of Systems  Constitutional: Negative for chills, fever, malaise/fatigue and weight loss.  HENT: Negative for congestion, ear discharge and nosebleeds.   Eyes: Negative for blurred vision.  Respiratory: Negative for cough, hemoptysis, sputum production, shortness of breath and wheezing.   Cardiovascular: Negative for chest pain, palpitations, orthopnea and claudication.  Gastrointestinal: Positive for blood in stool and diarrhea. Negative for abdominal pain, constipation, heartburn, melena, nausea and vomiting.  Genitourinary: Negative for dysuria, flank pain, frequency, hematuria and urgency.  Musculoskeletal: Negative for back pain, joint pain and myalgias.  Skin: Negative for rash.  Neurological: Negative for dizziness, tingling, focal weakness, seizures, weakness and headaches.  Endo/Heme/Allergies: Does not bruise/bleed easily.  Psychiatric/Behavioral: Negative for depression and suicidal ideas. The patient does not have insomnia.       Allergies  Allergen  Reactions  . Ketamine Anxiety and Other (See Comments)    Syncope episode/confusion      Past Medical History:  Diagnosis Date  . Abnormal cervical Papanicolaou smear 09/18/2017  . Anxiety   . Aortic atherosclerosis ([place])   . Arthritis    neck and knees  . 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 Blood transfusion without reported diagnosis   . Cervical cancer (HCC)    mets lung  . Chronic anal fissure   . Chronic diarrhea   . Dyspnea   . Erosive gastropathy 09/18/2017  . Factor V Leiden mutation ([place])   . Fecal incontinence   . Genital warts   . GERD (gastroesophageal reflux disease)   . Heart murmur   . Hemorrhoids   . 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 Hepatitis, chronic ([place]) 05/05/2017  . History of cancer chemotherapy    completed 06/2017  . History of Clostridium difficile infection    while undergoing chemo.  Negative test 10/2017  . Ileocolic anastomotic leak   . 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 Intestinal infection due to Clostridium difficile 09/18/2017  . Macrocytic anemia with vitamin B12 deficiency   . Nausea vomiting and diarrhea   . Pancolitis ([place]) 07/27/2018  . Perianal condylomata   . Pneumonia    History of  . Pulmonary nodules   . Rectal bleeding   . Small bowel obstruction ([place]) 08/2017  . Stiff neck    limited right turn  . Vitamin D deficiency      Past Surgical History:  Procedure Laterality Date  . CHOLECYSTECTOMY    . COLON SURGERY  08/2017   resection  . COLONOSCOPY WITH PROPOFOL N/A 12/20/2017   Procedure: COLONOSCOPY  WITH PROPOFOL;  Surgeon: [name], MD;  Location: [facility] ENDOSCOPY;  Service: Gastroenterology;  Laterality: N/A;  . COLONOSCOPY WITH PROPOFOL N/A 07/30/2018   Procedure: COLONOSCOPY WITH PROPOFOL;  Surgeon: [name], MD;  Location: [facility] ENDOSCOPY;  Service: Gastroenterology;  Laterality: N/A;  . DIAGNOSTIC LAPAROSCOPY    .  ESOPHAGOGASTRODUODENOSCOPY (EGD) WITH PROPOFOL N/A 12/20/2017   Procedure: ESOPHAGOGASTRODUODENOSCOPY (EGD) WITH PROPOFOL;  Surgeon: [name], MD;  Location: [place];  Service: Gastroenterology;  Laterality: N/A;  . ESOPHAGOGASTRODUODENOSCOPY (EGD) WITH PROPOFOL  07/30/2018   Procedure: ESOPHAGOGASTRODUODENOSCOPY (EGD) WITH PROPOFOL;  Surgeon: [name], MD;  Location: [facility] ENDOSCOPY;  Service: Gastroenterology;;  . LAPAROTOMY N/A 08/31/2017   Procedure: EXPLORATORY LAPAROTOMY for SBO, ileocolectomy, removal of piece of uterine wall;  Surgeon: [name], MD;  Location: [facility] ORS;  Service: General;  Laterality: N/A;  . LASER ABLATION CONDOLAMATA N/A 02/22/2018   Procedure: LASER ABLATION/REMOVAL OF MOQHUTMLYYT AROUND ANUS AND VAGINA;  Surgeon: Michael Boston, MD;  Location: [place];  Service: General;  Laterality: N/A;  . OOPHORECTOMY    . PORTA CATH INSERTION N/A 05/13/2018   Procedure: PORTA CATH INSERTION;  Surgeon: Katha Cabal, MD;  Location: [place] [facility];  Service: Cardiovascular;  Laterality: N/A;  . SMALL INTESTINE SURGERY    . TANDEM RING INSERTION     x3  . THORACOTOMY    . TOTAL KNEE ARTHROPLASTY Left 04/24/2018   Procedure: TOTAL KNEE ARTHROPLASTY;  Surgeon: Lovell Sheehan, MD;  Location: [facility] ORS;  Service: Orthopedics;  Laterality: Left;    Social History   Socioeconomic History  . Marital status: Divorced    Spouse name: Not on file  . Number of children: Not on file  . Years of education: Not on file  . Highest education level: Not on file  Occupational History  . Not on file  Social Needs  . Financial resource strain: Not hard at all  . Food insecurity:    Worry: Never true    Inability: Never true  . Transportation needs:    Medical: No    Non-medical: No  Tobacco Use  . Smoking status: Former Smoker    Last attempt to quit: 10/16/2006    Years since quitting: 11.9  . Smokeless tobacco: Never Used    Substance and Sexual Activity  . Alcohol use: Yes    Frequency: Never    Comment: seldom  . Drug use: Yes    Types: Marijuana  . Sexual activity: Not Currently    Birth control/protection: Post-menopausal    Comment: Not Asked  Lifestyle  . Physical activity:    Days per week: Patient refused    Minutes per session: Patient refused  . Stress: Only a little  Relationships  . Social connections:    Talks on phone: Patient refused    Gets together: Patient refused    Attends religious service: Patient refused    Active member of club or organization: Patient refused    Attends meetings of clubs or organizations: Patient refused    Relationship status: Patient refused  . Intimate partner violence:    Fear of current or ex partner: No    Emotionally abused: No    Physically abused: No    Forced sexual activity: No  Other Topics Concern  . Not on file  Social History Narrative  . Not on file    Family History  Problem Relation Age of Onset  . Hypertension Father   .  Diabetes Father   . Alcohol abuse Daughter   . Hypertension Maternal Grandmother   . Diabetes Maternal Grandmother   . Diabetes Paternal Grandmother   . Hypertension Paternal Grandmother      Current Facility-Administered Medications:  .  0.9 %  sodium chloride infusion, , Intravenous, Continuous, Gouru, Aruna, MD, Last Rate: 100 mL/hr at 10/09/18 0621 .  albuterol (PROVENTIL) (2.5 MG/3ML) 0.083% nebulizer solution 2.5 mg, 2.5 mg, Nebulization, Q4H PRN, Gouru, Aruna, MD .  amitriptyline (ELAVIL) tablet 50 mg, 50 mg, Oral, QHS, Gouru, Aruna, MD, 50 mg at 10/08/18 2125 .  Eluxadoline TABS 100 mg, 100 mg, Oral, BID WC, Gouru, Aruna, MD .  HYDROcodone-acetaminophen (NORCO/VICODIN) 5-325 MG per tablet 1 tablet, 1 tablet, Oral, Q6H PRN, Gouru, Aruna, MD, 1 tablet at 10/09/18 1240 .  hydrocortisone (CORTEF) tablet 10 mg, 10 mg, Oral, TID, Gouru, Aruna, MD, 10 mg at 10/09/18 0759 .  OLANZapine zydis (ZYPREXA)  disintegrating tablet 5 mg, 5 mg, Oral, QHS, Gouru, Aruna, MD, 5 mg at 10/08/18 2125 .  pantoprazole (PROTONIX) injection 40 mg, 40 mg, Intravenous, Q12H, Gouru, Aruna, MD, 40 mg at 10/09/18 0806 .  phenazopyridine (PYRIDIUM) tablet 200 mg, 200 mg, Oral, TID WC, Gouru, Aruna, MD, 200 mg at 10/09/18 1146 .  promethazine (PHENERGAN) tablet 12.5 mg, 12.5 mg, Oral, Q6H PRN, Gouru, Aruna, MD, 12.5 mg at 10/09/18 0370 .  traZODone (DESYREL) tablet 25-50 mg, 25-50 mg, Oral, QHS PRN, Gouru, Aruna, MD  Facility-Administered Medications Ordered in Other Encounters:  .  heparin lock flush 100 unit/mL, 500 Units, Intravenous, Once, Corcoran, Melissa C, MD .  sodium chloride 0.9 % 1,000 mL with potassium chloride 20 mEq infusion, , Intravenous, Once, Karen Kitchens, NP  Physical exam:  Vitals:   10/08/18 1723 10/08/18 2030 10/09/18 0609 10/09/18 1207  BP: 109/70 119/81 140/89 125/72  Pulse: 65 67 69 74  Resp: 17  20 19   Temp: (!) 97.1 F (36.2 C) 98 F (36.7 C) 98.4 F (36.9 C) 98.2 F (36.8 C)  TempSrc: Oral Oral Oral Oral  SpO2: 100% 100% 98% 98%  Weight:   176 lb 6.4 oz (80 kg)   Height:       Physical Exam Constitutional:      General: She is not in acute distress. HENT:     Head: Normocephalic and atraumatic.  Eyes:     Pupils: Pupils are equal, round, and reactive to light.  Neck:     Musculoskeletal: Normal range of motion.  Cardiovascular:     Rate and Rhythm: Normal rate and regular rhythm.     Heart sounds: Normal heart sounds.  Pulmonary:     Effort: Pulmonary effort is normal.     Breath sounds: Normal breath sounds.  Abdominal:     General: Bowel sounds are normal. There is no distension.     Palpations: Abdomen is soft.     Tenderness: There is no abdominal tenderness.  Skin:    General: Skin is warm and dry.  Neurological:     Mental Status: She is alert and oriented to person, place, and time.      CMP Latest Ref Rng & Units 10/09/2018  Glucose 70 - 99 mg/dL 89    BUN 6 - 20 mg/dL 7  Creatinine 0.44 - 1.00 mg/dL 0.61  Sodium 135 - 145 mmol/L 137  Potassium 3.5 - 5.1 mmol/L 3.3(L)  Chloride 98 - 111 mmol/L 110  CO2 22 - 32 mmol/L 23  Calcium 8.9 -  10.3 mg/dL 8.4(L)  Total Protein 6.5 - 8.1 g/dL 5.9(L)  Total Bilirubin 0.3 - 1.2 mg/dL 0.5  Alkaline Phos 38 - 126 U/L 128(H)  AST 15 - 41 U/L 23  ALT 0 - 44 U/L 31   CBC Latest Ref Rng & Units 10/09/2018  WBC 4.0 - 10.5 K/uL 3.7(L)  Hemoglobin 12.0 - 15.0 g/dL 10.9(L)  Hematocrit 36.0 - 46.0 % 32.6(L)  Platelets 150 - 400 K/uL 67(L)    @IMAGES @  Ct Chest W Contrast  Result Date: 09/30/2018 CLINICAL DATA:  Metastatic carcinoma. Cervical cancer diagnosed November 2017 status post radiation. Undergoing chemotherapy. EXAM: CT CHEST WITH CONTRAST TECHNIQUE: Multidetector CT imaging of the chest was performed during intravenous contrast administration. CONTRAST:  52m ISOVUE-300 IOPAMIDOL (ISOVUE-300) INJECTION 61% COMPARISON:  05/06/2018 FINDINGS: Cardiovascular: The heart is normal in size. No pericardial effusion. The aorta is normal in caliber. No dissection. The branch vessels are patent. Scattered atherosclerotic calcifications at the ostia. No significant coronary artery calcifications. Mediastinum/Nodes: Small scattered mediastinal and hilar lymph nodes. 7 mm right hilar lymph node on image number 61 is stable. Small sub 5 mm mediastinal nodes are unchanged. Lungs/Pleura: Innumerable solid and cavitary pulmonary nodules are again demonstrated but there has been a definite improvement since the prior CT scan. 8 mm cavitary lesion in the right upper lobe on image number 28 previously measured 10 mm. 7.5 mm left upper lobe nodule on image number 66 previously measured 9 mm. 6.5 mm right lower lobe pulmonary nodule on image number 80 previously measured 10 mm. 6 mm left lower lobe cavitary lesion on image number 105 previously measured 9 mm. Numerous other pulmonary nodules have also slightly decreased in  size. No new or progressive findings. Stable emphysematous changes and areas of pulmonary scarring. Upper Abdomen: No significant upper abdominal findings. No evidence of hepatic metastatic disease. Status post cholecystectomy with stable common bile duct dilatation. No upper abdominal lymphadenopathy. Scattered stable retroperitoneal lymph nodes. Musculoskeletal: No significant bony findings. IMPRESSION: 1. Interval decrease in size and number of metastatic pulmonary nodules. 2. No new/progressive findings in the chest or upper abdomen. Aortic Atherosclerosis (ICD10-I70.0) and Emphysema (ICD10-J43.9). Electronically Signed   By: PMarijo SanesM.D.   On: 09/30/2018 09:53     Assessment and plan- Patient is a 52y.o. female with Stage 4 cervical cancer with lung mets admitted for rectal bleeding and pancytopenia  1. Rectal bleeding- etiology unclear.She gets colonoscopy tomorrow. Partly her anemia may also be due to recent chemotherapy. Hb was 8.9 yesterday and improved to 10.9 today. Last dose of xarelto was on 12/24 at 6 am. She will be 2 days off xarelto for procedure tomorrow  2. Thrombocytopenia- platelets still trending down but likely to improve over next week or so. Hold xarelto given rectal bleeding and planned colonoscopy tomorrow. Dr. CPatsy Baltimoreto decide aboutrestarting xarelto basedon platelet counts tomorrow post proceure  3. Leukopenia: due to chemotherapy. She received neulasta as an outpatient. anc 1.8 yesterday ok to proceed with colonoscopy tomorrow. Improving.     Visit Diagnosis 1. Acute on chronic blood loss anemia   2. Hematochezia      Dr. ARanda Evens MD, MPH [id number]/25/2019 3:53 PM

## 2018-10-09 NOTE — Progress Notes (Signed)
[place] [facility] [place] NAME: Joanna Hall    MR#:  [id number]  DATE OF BIRTH:  03-02-1966  SUBJECTIVE:  CHIEF COMPLAINT:  Pt denies any more bleed last night. Doing ok  REVIEW OF SYSTEMS:  CONSTITUTIONAL: No fever, fatigue or weakness.  EYES: No blurred or double vision.  EARS, NOSE, AND THROAT: No tinnitus or ear pain.  RESPIRATORY: No cough, shortness of breath, wheezing or hemoptysis.  CARDIOVASCULAR: No chest pain, orthopnea, edema.  GASTROINTESTINAL: No nausea, vomiting, diarrhea or abdominal pain.  GENITOURINARY: No dysuria, hematuria.  ENDOCRINE: No polyuria, nocturia,  HEMATOLOGY: No anemia, easy bruising or bleeding SKIN: No rash or lesion. MUSCULOSKELETAL: No joint pain or arthritis.   NEUROLOGIC: No tingling, numbness, weakness.  PSYCHIATRY: No anxiety or depression.   DRUG ALLERGIES:   Allergies  Allergen Reactions  . Ketamine Anxiety and Other (See Comments)    Syncope episode/confusion     VITALS:  Blood pressure (!) 165/92, pulse 72, temperature 97.8 F (36.6 C), temperature source Oral, resp. rate 20, height 5' 8"  (1.727 m), weight 80 kg, SpO2 99 %.  PHYSICAL EXAMINATION:  GENERAL:  52 y.o.-year-old patient lying in the bed with no acute distress.  EYES: Pupils equal, round, reactive to light and accommodation. No scleral icterus. Extraocular muscles intact.  HEENT: Head atraumatic, normocephalic. Oropharynx and nasopharynx clear.  NECK:  Supple, no jugular venous distention. No thyroid enlargement, no tenderness.  LUNGS: Normal breath sounds bilaterally, no wheezing, rales,rhonchi or crepitation. No use of accessory muscles of respiration.  CARDIOVASCULAR: S1, S2 normal. No murmurs, rubs, or gallops.  ABDOMEN: Soft, nontender, nondistended. Bowel sounds present. EXTREMITIES: No pedal edema, cyanosis, or clubbing.  NEUROLOGIC:Awake ,alert . Sensation intact. Gait not checked.  PSYCHIATRIC: The patient  is alert and oriented x 3.  SKIN: No obvious rash, lesion, or ulcer.    LABORATORY PANEL:   CBC Recent Labs  Lab 10/09/18 0500  WBC 3.7*  HGB 10.9*  HCT 32.6*  PLT 67*   ------------------------------------------------------------------------------------------------------------------  Chemistries  Recent Labs  Lab 10/08/18 0802 10/09/18 0500  NA 138 137  K 3.3* 3.3*  CL 108 110  CO2 25 23  GLUCOSE 118* 89  BUN 13 7  CREATININE 0.66 0.61  CALCIUM 8.7* 8.4*  MG 1.7  --   AST  --  23  ALT  --  31  ALKPHOS  --  128*  BILITOT  --  0.5   ------------------------------------------------------------------------------------------------------------------  Cardiac Enzymes No results for input(s): TROPONINI in the last 168 hours. ------------------------------------------------------------------------------------------------------------------  RADIOLOGY:  No results found.  EKG:   Orders placed or performed during the hospital encounter of 06/10/18  . ED EKG  . ED EKG  . EKG    ASSESSMENT AND PLAN:    Lower GI bleed-probably from chemo-induced colitis Clear liquid diet Hemoglobin 13-8.9 s/p PRBC - 11.1-10.9 today Close monitoring of the hemoglobin and hematocrit and transfuse as needed GI consult placed as patient was seen by Dr. Marius Ditch in the past. Scheduled for colonoscopy in am Xarelto on hold Gentle hydration with IV fluids, PPI Oncology dr.Rao is following , cbc with diff in am  Essential hypertension-blood pressure is low normal, hold antihypertensive Gentle hydration with IV fluids  Acute on chronic diarrhea- Check GI panel and C. difficile panel  Thrombocytopenia with platelet count 81,000 No active bleeding.  Continue close monitoring of the platelet count Xarelto on hold  DVT prophylaxis with SCDs    All the  records are reviewed and case discussed with Care Management/Social Workerr. Management plans discussed with the patient, family  and they are in agreement.  CODE STATUS:   TOTAL TIME TAKING CARE OF THIS PATIENT: 36 minutes.   POSSIBLE D/C IN 1-2  DAYS, DEPENDING ON CLINICAL CONDITION.  Note: This dictation was prepared with Dragon dictation along with smaller phrase technology. Any transcriptional errors that result from this process are unintentional.   Nicholes Mango M.D on 10/09/2018 at 9:25 PM  Between 7am to 6pm - Pager - [phone] After 6pm go to [email or web address] - password [email or web address]  Office  [phone]  CC: Primary care physician; Arnetha Courser, MD

## 2018-10-09 NOTE — Consult Note (Signed)
Lucilla Lame, MD [facility]  [address], [place], [place] [address] Phone: [phone] Fax : [phone]  Consultation  Referring Provider:     Dr. Margaretmary Eddy  Primary Care Physician:  Arnetha Courser, MD Primary Gastroenterologist:  Dr. Marius Ditch         Reason for Consultation:     Hematochezia  Date of Admission:  10/08/2018 Date of Consultation:  10/09/2018         HPI:   Joanna Hall is a 52 y.o. female with a known history of stage IV cervical cancer with lung mets.  The patient is followed by Dr. Marius Ditch for ulcerative colitis and chronic diarrhea.  The patient states she has been on multiple medications just to keep her diarrhea under control.  The patient was seen in oncology yesterday and found to have a hemoglobin that went from 13 down to 8.9.  The patient also states that she has had chronic abdominal pain.  The patient has also been on Xarelto which has been held.  The patient also has thrombocytopenia.  The patient was also evaluated by oncology who suggested that some of her anemia may be due to her recent chemotherapy and her hemoglobin has improved to 10.9 today.  Past Medical History:  Diagnosis Date  . Abnormal cervical Papanicolaou smear 09/18/2017  . Anxiety   . Aortic atherosclerosis ([place])   . Arthritis    neck and knees  . 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 Blood transfusion without reported diagnosis   . Cervical cancer (HCC)    mets lung  . Chronic anal fissure   . Chronic diarrhea   . Dyspnea   . Erosive gastropathy 09/18/2017  . Factor V Leiden mutation ([place])   . Fecal incontinence   . Genital warts   . GERD (gastroesophageal reflux disease)   . Heart murmur   . Hemorrhoids   . 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 Hepatitis, chronic ([place]) 05/05/2017  . History of cancer chemotherapy    completed 06/2017  . History of Clostridium difficile infection    while undergoing chemo.  Negative test  10/2017  . Ileocolic anastomotic leak   . 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 Intestinal infection due to Clostridium difficile 09/18/2017  . Macrocytic anemia with vitamin B12 deficiency   . Nausea vomiting and diarrhea   . Pancolitis ([place]) 07/27/2018  . Perianal condylomata   . Pneumonia    History of  . Pulmonary nodules   . Rectal bleeding   . Small bowel obstruction ([place]) 08/2017  . Stiff neck    limited right turn  . Vitamin D deficiency     Past Surgical History:  Procedure Laterality Date  . CHOLECYSTECTOMY    . COLON SURGERY  08/2017   resection  . COLONOSCOPY WITH PROPOFOL N/A 12/20/2017   Procedure: COLONOSCOPY WITH PROPOFOL;  Surgeon: [name], MD;  Location: [facility] ENDOSCOPY;  Service: Gastroenterology;  Laterality: N/A;  . COLONOSCOPY WITH PROPOFOL N/A 07/30/2018   Procedure: COLONOSCOPY WITH PROPOFOL;  Surgeon: [name], MD;  Location: [facility] ENDOSCOPY;  Service: Gastroenterology;  Laterality: N/A;  . DIAGNOSTIC LAPAROSCOPY    . ESOPHAGOGASTRODUODENOSCOPY (EGD) WITH PROPOFOL N/A 12/20/2017   Procedure: ESOPHAGOGASTRODUODENOSCOPY (EGD) WITH PROPOFOL;  Surgeon: [name], MD;  Location: [place];  Service: Gastroenterology;  Laterality: N/A;  .  ESOPHAGOGASTRODUODENOSCOPY (EGD) WITH PROPOFOL  07/30/2018   Procedure: ESOPHAGOGASTRODUODENOSCOPY (EGD) WITH PROPOFOL;  Surgeon: [name], MD;  Location: [place];  Service: Gastroenterology;;  . LAPAROTOMY N/A 08/31/2017   Procedure: EXPLORATORY LAPAROTOMY for SBO, ileocolectomy, removal of piece of uterine wall;  Surgeon: [name], MD;  Location: [facility] ORS;  Service: General;  Laterality: N/A;  . LASER ABLATION CONDOLAMATA N/A 02/22/2018   Procedure: LASER ABLATION/REMOVAL OF HERDEYCXKGY AROUND ANUS AND VAGINA;  Surgeon: Michael Boston, MD;  Location: [place];  Service: General;  Laterality: N/A;  . OOPHORECTOMY    . PORTA CATH INSERTION  N/A 05/13/2018   Procedure: PORTA CATH INSERTION;  Surgeon: Katha Cabal, MD;  Location: [place] CV LAB;  Service: Cardiovascular;  Laterality: N/A;  . SMALL INTESTINE SURGERY    . TANDEM RING INSERTION     x3  . THORACOTOMY    . TOTAL KNEE ARTHROPLASTY Left 04/24/2018   Procedure: TOTAL KNEE ARTHROPLASTY;  Surgeon: Lovell Sheehan, MD;  Location: [facility] ORS;  Service: Orthopedics;  Laterality: Left;    Prior to Admission medications   Medication Sig Start Date End Date Taking? Authorizing Provider  amitriptyline (ELAVIL) 50 MG tablet Take 1 tablet (50 mg total) by mouth at bedtime. 08/19/18 10/08/18 Yes Vanga, Tally Due, MD  Eluxadoline (VIBERZI) 100 MG TABS Take 1 tablet (100 mg total) by mouth 2 (two) times daily with a meal. 08/16/18  Yes Verlon Au, NP  HYDROcodone-acetaminophen (NORCO/VICODIN) 5-325 MG tablet Take 1 tablet by mouth every 6 (six) hours as needed for moderate pain. 10/04/18  Yes Burns, Wandra Feinstein, NP  hydrocortisone (CORTEF) 10 MG tablet Take 10 mg by mouth 3 (three) times daily.   Yes [provider]  Multiple Vitamins-Minerals (MULTIVITAMIN WITH MINERALS) tablet Take 1 tablet by mouth daily. 08/01/18 08/01/19 Yes Wieting, Richard, MD  OLANZapine zydis (ZYPREXA) 5 MG disintegrating tablet Take 1 tablet (5 mg total) by mouth at bedtime. 08/01/18  Yes Wieting, Richard, MD  ondansetron (ZOFRAN) 8 MG tablet Take 1 tablet (8 mg total) by mouth every 8 (eight) hours as needed for nausea or vomiting. 08/23/18  Yes Karen Kitchens, NP  pantoprazole (PROTONIX) 40 MG tablet Take 1 tablet (40 mg total) by mouth daily. 08/01/18  Yes Wieting, Richard, MD  phenazopyridine (PYRIDIUM) 200 MG tablet Take 1 tablet (200 mg total) by mouth 3 (three) times daily as needed for pain. 10/07/18  Yes Jacquelin Hawking, NP  promethazine (PHENERGAN) 25 MG tablet Take 1 tablet (25 mg total) by mouth every 6 (six) hours as needed for nausea or vomiting. 10/02/18  Yes Karen Kitchens,  NP  rivaroxaban (XARELTO) 20 MG TABS tablet Take 1 tablet (20 mg total) by mouth daily with supper. 08/27/18  Yes Karen Kitchens, NP  Calcium Carb-Cholecalciferol (CALCIUM 500 +D) 500-400 MG-UNIT TABS Take 2 tablets by mouth daily.    [provider]  colestipol (COLESTID) 1 g tablet Take 2 tablets (2 g total) by mouth 3 (three) times daily with meals. Patient not taking: Reported on 10/08/2018 08/01/18   Loletha Grayer, MD  cyanocobalamin (,VITAMIN B-12,) 1000 MCG/ML injection Inject 1ML every week x 4 weeks, then 1ML every other week x 2 months, then monthly x 3 months Patient not taking: Reported on 10/08/2018 11/29/17   [name], MD  Syringe/Needle, Disp, (SYRINGE 3CC/22GX1") 22G X 1" 3 ML MISC For use with Vitamin B12 injections 04/12/18   [name], MD  traZODone (  DESYREL) 50 MG tablet Take 0.5-1 tablets (25-50 mg total) by mouth at bedtime as needed for sleep. Patient not taking: Reported on 10/08/2018 08/13/18   Karen Kitchens, NP  VENTOLIN HFA 108 (90 Base) MCG/ACT inhaler Inhale 1-2 puffs into the lungs every 4 (four) hours as needed for shortness of breath.     [provider]    Family History  Problem Relation Age of Onset  . Hypertension Father   . Diabetes Father   . Alcohol abuse Daughter   . Hypertension Maternal Grandmother   . Diabetes Maternal Grandmother   . Diabetes Paternal Grandmother   . Hypertension Paternal Grandmother      Social History   Tobacco Use  . Smoking status: Former Smoker    Last attempt to quit: 10/16/2006    Years since quitting: 11.9  . Smokeless tobacco: Never Used  Substance Use Topics  . Alcohol use: Yes    Frequency: Never    Comment: seldom  . Drug use: Yes    Types: Marijuana    Allergies as of 10/08/2018 - Review Complete 10/08/2018  Allergen Reaction Noted  . Ketamine Anxiety and Other (See Comments) 02/22/2017    Review of Systems:    All systems reviewed and negative except where noted  in HPI.   Physical Exam:  Vital signs in last 24 hours: Temp:  [97 F (36.1 C)-98.4 F (36.9 C)] 98.2 F (36.8 C) (12/25 1207) Pulse Rate:  [65-74] 74 (12/25 1207) Resp:  [14-20] 19 (12/25 1207) BP: (109-140)/(70-89) 125/72 (12/25 1207) SpO2:  [98 %-100 %] 98 % (12/25 1207) Weight:  [80 kg] 80 kg (12/25 0609) Last BM Date: 10/09/18 General:   Pleasant, cooperative in NAD Head:  Normocephalic and atraumatic. Eyes:   No icterus.   Conjunctiva pink. PERRLA. Ears:  Normal auditory acuity. Neck:  Supple; no masses or thyroidomegaly Lungs: Respirations even and unlabored. Lungs clear to auscultation bilaterally.   No wheezes, crackles, or rhonchi.  Heart:  Regular rate and rhythm;  Without murmur, clicks, rubs or gallops Abdomen:  Soft, nondistended, nontender. Normal bowel sounds. No appreciable masses or hepatomegaly.  No rebound or guarding.  Rectal:  Not performed. Msk:  Symmetrical without gross deformities.    Extremities:  Without edema, cyanosis or clubbing. Neurologic:  Alert and oriented x3;  grossly normal neurologically. Skin:  Intact without significant lesions or rashes. Cervical Nodes:  No significant cervical adenopathy. Psych:  Alert and cooperative. Normal affect.  LAB RESULTS: Recent Labs    10/08/18 0813 10/08/18 2043 10/09/18 0500  WBC 2.7*  --  3.7*  HGB 8.9* 11.1* 10.9*  HCT 27.4* 33.0* 32.6*  PLT 81*  --  67*   BMET Recent Labs    10/07/18 0905 10/08/18 0802 10/09/18 0500  NA 138 138 137  K 3.5 3.3* 3.3*  CL 104 108 110  CO2 24 25 23   GLUCOSE 100* 118* 89  BUN 16 13 7   CREATININE 0.77 0.66 0.61  CALCIUM 9.1 8.7* 8.4*   LFT Recent Labs    10/09/18 0500  PROT 5.9*  ALBUMIN 3.0*  AST 23  ALT 31  ALKPHOS 128*  BILITOT 0.5   PT/INR Recent Labs    10/09/18 0500  LABPROT 13.3  INR 1.02    STUDIES: No results found.    Impression / Plan:   Assessment: Active Problems:   GI bleed   Acute respiratory failure with hypoxia  (HCC)   NIKESHA KWASNY is a 52 y.o.  y/o female with stage IV cervical cancer with pulmonary mets.  The patient has had rectal bleeding and has a history of a right hemicolectomy.  The patient may have a anastomotic ulcer as the cause of her hematochezia.  Plan:  The patient will be set up for a colonoscopy for tomorrow.  This is to rule out any anastomotic ulcer and due to her possible need of restarting her Xarelto.  The patient will be given a prep today for the colonoscopy tomorrow. I have discussed risks & benefits which include, but are not limited to, bleeding, infection, perforation & drug reaction.  The patient agrees with this plan & written consent will be obtained.     Thank you for involving me in the care of this patient.      LOS: 1 day   Lucilla Lame, MD  10/09/2018, 4:02 PM    Note: This dictation was prepared with Dragon dictation along with smaller phrase technology. Any transcriptional errors that result from this process are unintentional.

## 2018-10-10 ENCOUNTER — Encounter: Payer: Self-pay | Admitting: Anesthesiology

## 2018-10-10 ENCOUNTER — Inpatient Hospital Stay: Payer: Medicaid Other | Admitting: Anesthesiology

## 2018-10-10 ENCOUNTER — Other Ambulatory Visit: Payer: Self-pay | Admitting: *Deleted

## 2018-10-10 ENCOUNTER — Encounter
Admission: EM | Disposition: A | Discharge status: Home / Self Care | Payer: Self-pay | Source: Ambulatory Visit | Attending: Internal Medicine

## 2018-10-10 ENCOUNTER — Ambulatory Visit: Payer: Medicaid Other | Admitting: Hematology and Oncology

## 2018-10-10 DIAGNOSIS — C530 Malignant neoplasm of endocervix: Secondary | ICD-10-CM

## 2018-10-10 HISTORY — PX: COLONOSCOPY WITH PROPOFOL: SHX5780

## 2018-10-10 LAB — CBC WITH DIFFERENTIAL/PLATELET
Abs Immature Granulocytes: 0.08 10*3/uL — ABNORMAL HIGH (ref 0.00–0.07)
Basophils Absolute: 0 10*3/uL (ref 0.0–0.1)
Basophils Relative: 0 %
Eosinophils Absolute: 0 10*3/uL (ref 0.0–0.5)
Eosinophils Relative: 1 %
HCT: 31.5 % — ABNORMAL LOW (ref 36.0–46.0)
Hemoglobin: 10.4 g/dL — ABNORMAL LOW (ref 12.0–15.0)
Immature Granulocytes: 2 %
Lymphocytes Relative: 21 %
Lymphs Abs: 0.7 10*3/uL (ref 0.7–4.0)
MCH: 32.6 pg (ref 26.0–34.0)
MCHC: 33 g/dL (ref 30.0–36.0)
MCV: 98.7 fL (ref 80.0–100.0)
MONO ABS: 0.3 10*3/uL (ref 0.1–1.0)
Monocytes Relative: 8 %
Neutro Abs: 2.3 10*3/uL (ref 1.7–7.7)
Neutrophils Relative %: 68 %
Platelets: 61 10*3/uL — ABNORMAL LOW (ref 150–400)
RBC: 3.19 MIL/uL — ABNORMAL LOW (ref 3.87–5.11)
RDW: 15.7 % — ABNORMAL HIGH (ref 11.5–15.5)
WBC: 3.5 10*3/uL — AB (ref 4.0–10.5)
nRBC: 0 % (ref 0.0–0.2)

## 2018-10-10 SURGERY — COLONOSCOPY WITH PROPOFOL
Anesthesia: General

## 2018-10-10 MED ORDER — PANTOPRAZOLE SODIUM 40 MG PO TBEC
40.0000 mg | DELAYED_RELEASE_TABLET | Freq: Two times a day (BID) | ORAL | Status: DC
  Administered 2018-10-10 – 2018-10-11 (×2): 40 mg via ORAL
  Filled 2018-10-10 (×2): qty 1

## 2018-10-10 MED ORDER — ADULT MULTIVITAMIN W/MINERALS CH
1.0000 | ORAL_TABLET | Freq: Every day | ORAL | Status: DC
  Administered 2018-10-10 – 2018-10-11 (×2): 1 via ORAL
  Filled 2018-10-10 (×2): qty 1

## 2018-10-10 MED ORDER — PROPOFOL 10 MG/ML IV BOLUS
INTRAVENOUS | Status: DC | PRN
  Administered 2018-10-10: 40 mg via INTRAVENOUS
  Administered 2018-10-10: 60 mg via INTRAVENOUS
  Administered 2018-10-10: 40 mg via INTRAVENOUS

## 2018-10-10 MED ORDER — DIPHENOXYLATE-ATROPINE 2.5-0.025 MG PO TABS
2.0000 | ORAL_TABLET | Freq: Four times a day (QID) | ORAL | Status: DC
  Administered 2018-10-10 – 2018-10-11 (×4): 2 via ORAL
  Filled 2018-10-10 (×4): qty 2

## 2018-10-10 MED ORDER — LIDOCAINE HCL (PF) 2 % IJ SOLN
INTRAMUSCULAR | Status: AC
  Filled 2018-10-10: qty 10

## 2018-10-10 MED ORDER — BUDESONIDE 3 MG PO CPEP
9.0000 mg | ORAL_CAPSULE | Freq: Every day | ORAL | Status: DC
  Administered 2018-10-10 – 2018-10-11 (×2): 9 mg via ORAL
  Filled 2018-10-10 (×2): qty 3

## 2018-10-10 MED ORDER — PROPOFOL 500 MG/50ML IV EMUL
INTRAVENOUS | Status: AC
  Filled 2018-10-10: qty 50

## 2018-10-10 MED ORDER — LIDOCAINE HCL (CARDIAC) PF 100 MG/5ML IV SOSY
PREFILLED_SYRINGE | INTRAVENOUS | Status: DC | PRN
  Administered 2018-10-10: 50 mg via INTRAVENOUS

## 2018-10-10 MED ORDER — PROPOFOL 500 MG/50ML IV EMUL
INTRAVENOUS | Status: DC | PRN
  Administered 2018-10-10: 175 ug/kg/min via INTRAVENOUS

## 2018-10-10 NOTE — Progress Notes (Signed)
[facility] Hematology/Oncology Progress Note  Date of admission: 10/08/2018  Hospital day:  10/10/2018  Chief Complaint: Joanna Hall is a 52 y.o. female with stage IV cervical cancer who was admitted from the ER with symptomatic anemia and rectal bleeding.  Subjective:  Feels "ok".  Notes lots of blood yesterday.  Notes ongoing rectal bleeding and diarrhea.  Little nausea and vomiting.  Social History: The patient is alone today.  Allergies:  Allergies  Allergen Reactions  . Ketamine Anxiety and Other (See Comments)    Syncope episode/confusion     Scheduled Medications: . amitriptyline  50 mg Oral QHS  . budesonide  9 mg Oral Daily  . diphenoxylate-atropine  2 tablet Oral QID  . hydrocortisone  10 mg Oral TID  . multivitamin with minerals  1 tablet Oral Daily  . OLANZapine zydis  5 mg Oral QHS  . pantoprazole  40 mg Oral BID AC  . phenazopyridine  200 mg Oral TID WC    Review of Systems: GENERAL:  Fatigue.  No fevers, sweats or weight loss. PERFORMANCE STATUS (ECOG):  2 HEENT:  No visual changes, runny nose, sore throat, mouth sores or tenderness. Lungs: No shortness of breath or cough.  No hemoptysis. Cardiac:  No chest pain, palpitations, orthopnea, or PND. GI:  Abdominal discomfort.  Some nausea and intermittent vomiting.  Rectal bleeding  (burgundy colored).  No constipation or melena. GU:  Recent UTI symptoms.  Musculoskeletal:  No back pain.  No joint pain.  No muscle tenderness. Extremities:  No pain or swelling. Skin:  No rashes or skin changes. Neuro:  No dizziness.  No headache, numbness or weakness, balance or coordination issues. Endocrine:  No diabetes, thyroid issues, hot flashes or night sweats. Psych:  No mood changes, depression or anxiety. Pain:  No focal pain. Review of systems:  All other systems reviewed and found to be negative.  Physical Exam: Blood pressure (!) 153/77, pulse 77, temperature 98.3 F (36.8 C),  temperature source Oral, resp. rate 19, height 5' 8"  (1.727 m), weight 176 lb 5.9 oz (80 kg), SpO2 99 %.  GENERAL:  Thin chronically fatigued appearing woman sitting comfortably ion the medical unit in no acute distress. MENTAL STATUS:  Alert and oriented to person, place and time. HEAD:  Wearing a pink head wrap.  Normocephalic, atraumatic, face symmetric, no Cushingoid features. EYES:  Blue eyes.  Pupils equal round and reactive to light and accomodation.  No conjunctivitis or scleral icterus. ENT:  Oropharynx clear without lesion.  Tongue normal. Mucous membranes moist.  RESPIRATORY:  Clear to auscultation without rales, wheezes or rhonchi. CARDIOVASCULAR:  Regular rate and rhythm without murmur, rub or gallop. ABDOMEN:  Soft, slightly tender lower quadrants without guarding or rebound tenderness.  Active bowel sounds and no hepatosplenomegaly.  No masses. SKIN:  No rashes, ulcers or lesions. EXTREMITIES: No edema, no skin discoloration or tenderness.  No palpable cords. LYMPH NODES: No palpable cervical, supraclavicular, axillary or inguinal adenopathy  NEUROLOGICAL: Unremarkable. PSYCH:  Appropriate.   Results for orders placed or performed during the hospital encounter of 10/08/18 (from the past 48 hour(s))  C Difficile Quick Screen w PCR reflex     Status: None   Collection Time: 10/08/18  7:01 PM  Result Value Ref Range   C Diff antigen NEGATIVE NEGATIVE   C Diff toxin NEGATIVE NEGATIVE   C Diff interpretation No C. difficile detected.     Comment: Performed at [facility], [address] [place], [place] [address]  Gastrointestinal Panel by PCR , Stool     Status: None   Collection Time: 10/08/18  7:01 PM  Result Value Ref Range   Campylobacter species NOT DETECTED NOT DETECTED   Plesimonas shigelloides NOT DETECTED NOT DETECTED   Salmonella species NOT DETECTED NOT DETECTED   Yersinia enterocolitica NOT DETECTED NOT DETECTED   Vibrio species NOT DETECTED NOT  DETECTED   Vibrio cholerae NOT DETECTED NOT DETECTED   Enteroaggregative E coli (EAEC) NOT DETECTED NOT DETECTED   Enteropathogenic E coli (EPEC) NOT DETECTED NOT DETECTED   Enterotoxigenic E coli (ETEC) NOT DETECTED NOT DETECTED   Shiga like toxin producing E coli (STEC) NOT DETECTED NOT DETECTED   Shigella/Enteroinvasive E coli (EIEC) NOT DETECTED NOT DETECTED   Cryptosporidium NOT DETECTED NOT DETECTED   Cyclospora cayetanensis NOT DETECTED NOT DETECTED   Entamoeba histolytica NOT DETECTED NOT DETECTED   Giardia lamblia NOT DETECTED NOT DETECTED   Adenovirus F40/41 NOT DETECTED NOT DETECTED   Astrovirus NOT DETECTED NOT DETECTED   Norovirus GI/GII NOT DETECTED NOT DETECTED   Rotavirus A NOT DETECTED NOT DETECTED   Sapovirus (I, II, IV, and V) NOT DETECTED NOT DETECTED    Comment: Performed at [facility], [address] [place], [place] [address]  Hemoglobin and hematocrit, blood     Status: Abnormal   Collection Time: 10/08/18  8:43 PM  Result Value Ref Range   Hemoglobin 11.1 (L) 12.0 - 15.0 g/dL   HCT 33.0 (L) 36.0 - 46.0 %    Comment: Performed at [facility], [address] [place], [place] [address]  Comprehensive metabolic panel     Status: Abnormal   Collection Time: 10/09/18  5:00 AM  Result Value Ref Range   Sodium 137 135 - 145 mmol/L   Potassium 3.3 (L) 3.5 - 5.1 mmol/L   Chloride 110 98 - 111 mmol/L   CO2 23 22 - 32 mmol/L   Glucose, Bld 89 70 - 99 mg/dL   BUN 7 6 - 20 mg/dL   Creatinine, Ser 0.61 0.44 - 1.00 mg/dL   Calcium 8.4 (L) 8.9 - 10.3 mg/dL   Total Protein 5.9 (L) 6.5 - 8.1 g/dL   Albumin 3.0 (L) 3.5 - 5.0 g/dL   AST 23 15 - 41 U/L   ALT 31 0 - 44 U/L   Alkaline Phosphatase 128 (H) 38 - 126 U/L   Total Bilirubin 0.5 0.3 - 1.2 mg/dL   GFR calc non Af Amer >60 >60 mL/min   GFR calc Af Amer >60 >60 mL/min   Anion gap 4 (L) 5 - 15    Comment: Performed at [facility], [address] [place], [place] [address]  CBC      Status: Abnormal   Collection Time: 10/09/18  5:00 AM  Result Value Ref Range   WBC 3.7 (L) 4.0 - 10.5 K/uL   RBC 3.30 (L) 3.87 - 5.11 MIL/uL   Hemoglobin 10.9 (L) 12.0 - 15.0 g/dL   HCT 32.6 (L) 36.0 - 46.0 %   MCV 98.8 80.0 - 100.0 fL   MCH 33.0 26.0 - 34.0 pg   MCHC 33.4 30.0 - 36.0 g/dL   RDW 16.5 (H) 11.5 - 15.5 %   Platelets 67 (L) 150 - 400 K/uL    Comment: Immature Platelet Fraction may be clinically indicated, consider ordering this additional test [id number]    nRBC 0.0 0.0 - 0.2 %    Comment: Performed  at [facility], [address] [place], [place] [address]  Protime-INR     Status: None   Collection Time: 10/09/18  5:00 AM  Result Value Ref Range   Prothrombin Time 13.3 11.4 - 15.2 seconds   INR 1.02     Comment: Performed at [facility], [address] [place], [place] [address]  CBC with Differential/Platelet     Status: Abnormal   Collection Time: 10/10/18  5:18 AM  Result Value Ref Range   WBC 3.5 (L) 4.0 - 10.5 K/uL   RBC 3.19 (L) 3.87 - 5.11 MIL/uL   Hemoglobin 10.4 (L) 12.0 - 15.0 g/dL   HCT 31.5 (L) 36.0 - 46.0 %   MCV 98.7 80.0 - 100.0 fL   MCH 32.6 26.0 - 34.0 pg   MCHC 33.0 30.0 - 36.0 g/dL   RDW 15.7 (H) 11.5 - 15.5 %   Platelets 61 (L) 150 - 400 K/uL    Comment: Immature Platelet Fraction may be clinically indicated, consider ordering this additional test [id number]    nRBC 0.0 0.0 - 0.2 %   Neutrophils Relative % 68 %   Neutro Abs 2.3 1.7 - 7.7 K/uL   Lymphocytes Relative 21 %   Lymphs Abs 0.7 0.7 - 4.0 K/uL   Monocytes Relative 8 %   Monocytes Absolute 0.3 0.1 - 1.0 K/uL   Eosinophils Relative 1 %   Eosinophils Absolute 0.0 0.0 - 0.5 K/uL   Basophils Relative 0 %   Basophils Absolute 0.0 0.0 - 0.1 K/uL   Immature Granulocytes 2 %   Abs Immature Granulocytes 0.08 (H) 0.00 - 0.07 K/uL    Comment: Performed at [facility], [address] [place], [place] [address]   No results found.  Assessment:   Joanna Hall is a 52 y.o. female with stage IV cervical cancer currently day 11 s/p cycle #5 carboplatin and Taxol.  She was admitted with new onset rectal bleeding.  Colonoscopy today revealed 3 bleeding terminal ileum ulcers.  Pathology is pending.  She has chronic diarrhea. GI panel and C diff were negative on 10/08/2018.  Symptomatically, GI bleeding appears to be slowing down.  She notes abdominal discomfort without pain.  Hemoglobin is 10.4.  Platelet count is 61,000.  INR is 1.02.  Plan: 1. Oncology:    Day 11 s/p cycle #5 carboplatin and Taxol with Neulasta support.  CT scan on 09/30/2018 reveals decreasing pulmonary nodules. 2. Hematology:  Currently patient in nadir of cycle #5 chemotherapy.  Platelelet count low secondary to chemotherapy and consumption with rectal bleeding.  She received Neulasta.  Anticipate daily improvement in counts.  Agree with holding Xarelto for now.  Continue serial CBCs.  Maintain active type and screen.  DVT prophylaxis with ICDs. 3. Gastroenterology:  Colonoscopy today revealed 3 bleeding ulcers in terminal ileum.  Follow-up pending pathology. .  Diarrhea managed in outpatient department with colestipol, amitriptyline, and Viberzi.   Lequita Asal, MD  10/10/2018, 4:12 PM

## 2018-10-10 NOTE — Anesthesia Postprocedure Evaluation (Signed)
Anesthesia Post Note  Patient: Joanna Hall  Procedure(s) Performed: COLONOSCOPY WITH PROPOFOL (N/A )  Patient location during evaluation: Endoscopy Anesthesia Type: General Level of consciousness: awake and alert Pain management: pain level controlled Vital Signs Assessment: post-procedure vital signs reviewed and stable Respiratory status: spontaneous breathing, nonlabored ventilation, respiratory function stable and patient connected to nasal cannula oxygen Cardiovascular status: blood pressure returned to baseline and stable Postop Assessment: no apparent nausea or vomiting Anesthetic complications: no     Last Vitals:  Vitals:   10/10/18 0943 10/10/18 1005  BP: (!) 154/84 (!) 160/89  Pulse: 78 71  Resp: 18 19  Temp:  36.8 C  SpO2: 100% 100%    Last Pain:  Vitals:   10/10/18 1005  TempSrc: Oral  PainSc: [place]

## 2018-10-10 NOTE — Anesthesia Preprocedure Evaluation (Signed)
Anesthesia Evaluation  Patient identified by MRN, date of birth, ID band Patient awake    Reviewed: Allergy & Precautions, NPO status , Patient's Chart, lab work & pertinent test results, reviewed documented beta blocker date and time   Airway Mallampati: III  TM Distance: >3 FB     Dental  (+) Chipped   Pulmonary shortness of breath, pneumonia, resolved, former smoker,           Cardiovascular hypertension, Pt. on medications + Valvular Problems/Murmurs      Neuro/Psych Anxiety    GI/Hepatic PUD, GERD  ,(+) Hepatitis -  Endo/Other    Renal/GU Renal disease     Musculoskeletal  (+) Arthritis ,   Abdominal   Peds  Hematology  (+) anemia ,   Anesthesia Other Findings Factor V leiden. Hb 10.4. EF 60-65.   Reproductive/Obstetrics                             Anesthesia Physical Anesthesia Plan  ASA: III  Anesthesia Plan: General   Post-op Pain Management:    Induction: Intravenous  PONV Risk Score and Plan:   Airway Management Planned:   Additional Equipment:   Intra-op Plan:   Post-operative Plan:   Informed Consent: I have reviewed the patients History and Physical, chart, labs and discussed the procedure including the risks, benefits and alternatives for the proposed anesthesia with the patient or authorized representative who has indicated his/her understanding and acceptance.     Plan Discussed with: CRNA  Anesthesia Plan Comments:         Anesthesia Quick Evaluation

## 2018-10-10 NOTE — Progress Notes (Signed)
Anastomosis reached at 0914.

## 2018-10-10 NOTE — Anesthesia Procedure Notes (Signed)
Date/Time: 10/10/2018 9:10 AM Performed by: Johnna Acosta, CRNA Pre-anesthesia Checklist: Patient identified, Emergency Drugs available, Suction available, Patient being monitored and Timeout performed Patient Re-evaluated:Patient Re-evaluated prior to induction Oxygen Delivery Method: Nasal cannula Preoxygenation: Pre-oxygenation with 100% oxygen Induction Type: IV induction

## 2018-10-10 NOTE — Transfer of Care (Signed)
Immediate Anesthesia Transfer of Care Note  Patient: Joanna Hall  Procedure(s) Performed: COLONOSCOPY WITH PROPOFOL (N/A )  Patient Location: PACU  Anesthesia Type:General  Level of Consciousness: drowsy  Airway & Oxygen Therapy: Patient Spontanous Breathing and Patient connected to nasal cannula oxygen  Post-op Assessment: Report given to RN and Post -op Vital signs reviewed and stable  Post vital signs: Reviewed and stable  Last Vitals:  Vitals Value Taken Time  BP 132/83 10/10/2018  9:23 AM  Temp 36.2 C 10/10/2018  9:23 AM  Pulse 96 10/10/2018  9:23 AM  Resp 24 10/10/2018  9:23 AM  SpO2 100 % 10/10/2018  9:23 AM    Last Pain:  Vitals:   10/10/18 0923  TempSrc: Tympanic  PainSc:       Patients Stated Pain Goal: 1 (59/93/57 0177)  Complications: No apparent anesthesia complications

## 2018-10-10 NOTE — H&P (Signed)
Jonathon Bellows, MD [address], [place], [place], [place], [address] [address], [place], [place], [place], [address] Phone: [phone]  Fax: [phone]  Primary Care Physician:  Arnetha Courser, MD   Pre-Procedure History & Physical: HPI:  Joanna Hall is a 52 y.o. female is here for an colonoscopy.   Past Medical History:  Diagnosis Date  . Abnormal cervical Papanicolaou smear 09/18/2017  . Anxiety   . Aortic atherosclerosis ([place])   . Arthritis    neck and knees  . 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 Blood transfusion without reported diagnosis   . Cervical cancer (HCC)    mets lung  . Chronic anal fissure   . Chronic diarrhea   . Dyspnea   . Erosive gastropathy 09/18/2017  . Factor V Leiden mutation ([place])   . Fecal incontinence   . Genital warts   . GERD (gastroesophageal reflux disease)   . Heart murmur   . Hemorrhoids   . 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 Hepatitis, chronic ([place]) 05/05/2017  . History of cancer chemotherapy    completed 06/2017  . History of Clostridium difficile infection    while undergoing chemo.  Negative test 10/2017  . Ileocolic anastomotic leak   . 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 Intestinal infection due to Clostridium difficile 09/18/2017  . Macrocytic anemia with vitamin B12 deficiency   . Nausea vomiting and diarrhea   . Pancolitis ([place]) 07/27/2018  . Perianal condylomata   . Pneumonia    History of  . Pulmonary nodules   . Rectal bleeding   . Small bowel obstruction ([place]) 08/2017  . Stiff neck    limited right turn  . Vitamin D deficiency     Past Surgical History:  Procedure Laterality Date  . CHOLECYSTECTOMY    . COLON SURGERY  08/2017   resection  . COLONOSCOPY WITH PROPOFOL N/A 12/20/2017   Procedure: COLONOSCOPY WITH PROPOFOL;  Surgeon: [name], MD;  Location: [facility] ENDOSCOPY;  Service: Gastroenterology;  Laterality:  N/A;  . COLONOSCOPY WITH PROPOFOL N/A 07/30/2018   Procedure: COLONOSCOPY WITH PROPOFOL;  Surgeon: [name], MD;  Location: [facility] ENDOSCOPY;  Service: Gastroenterology;  Laterality: N/A;  . DIAGNOSTIC LAPAROSCOPY    . ESOPHAGOGASTRODUODENOSCOPY (EGD) WITH PROPOFOL N/A 12/20/2017   Procedure: ESOPHAGOGASTRODUODENOSCOPY (EGD) WITH PROPOFOL;  Surgeon: [name], MD;  Location: [place];  Service: Gastroenterology;  Laterality: N/A;  . ESOPHAGOGASTRODUODENOSCOPY (EGD) WITH PROPOFOL  07/30/2018   Procedure: ESOPHAGOGASTRODUODENOSCOPY (EGD) WITH PROPOFOL;  Surgeon: [name], MD;  Location: [facility] ENDOSCOPY;  Service: Gastroenterology;;  . LAPAROTOMY N/A 08/31/2017   Procedure: EXPLORATORY LAPAROTOMY for SBO, ileocolectomy, removal of piece of uterine wall;  Surgeon: [name], MD;  Location: [facility] ORS;  Service: General;  Laterality: N/A;  . LASER ABLATION CONDOLAMATA N/A 02/22/2018   Procedure: LASER ABLATION/REMOVAL OF [id number] AROUND ANUS AND VAGINA;  Surgeon: Michael Boston, MD;  Location: [place];  Service: General;  Laterality: N/A;  . OOPHORECTOMY    . PORTA CATH INSERTION N/A 05/13/2018   Procedure: PORTA CATH INSERTION;  Surgeon: Katha Cabal, MD;  Location: [place] CV [facility];  Service: Cardiovascular;  Laterality: N/A;  . SMALL INTESTINE SURGERY    . TANDEM RING INSERTION     x3  . THORACOTOMY    . TOTAL  KNEE ARTHROPLASTY Left 04/24/2018   Procedure: TOTAL KNEE ARTHROPLASTY;  Surgeon: Lovell Sheehan, MD;  Location: [facility] ORS;  Service: Orthopedics;  Laterality: Left;    Prior to Admission medications   Medication Sig Start Date End Date Taking? Authorizing Provider  amitriptyline (ELAVIL) 50 MG tablet Take 1 tablet (50 mg total) by mouth at bedtime. 08/19/18 10/08/18 Yes Vanga, Tally Due, MD  Eluxadoline (VIBERZI) 100 MG TABS Take 1 tablet (100 mg total) by mouth 2 (two) times daily with a meal. 08/16/18  Yes Verlon Au, NP  HYDROcodone-acetaminophen (NORCO/VICODIN) 5-325 MG tablet Take 1 tablet by mouth every 6 (six) hours as needed for moderate pain. 10/04/18  Yes Burns, Wandra Feinstein, NP  hydrocortisone (CORTEF) 10 MG tablet Take 10 mg by mouth 3 (three) times daily.   Yes [provider]  Multiple Vitamins-Minerals (MULTIVITAMIN WITH MINERALS) tablet Take 1 tablet by mouth daily. 08/01/18 08/01/19 Yes Wieting, Richard, MD  OLANZapine zydis (ZYPREXA) 5 MG disintegrating tablet Take 1 tablet (5 mg total) by mouth at bedtime. 08/01/18  Yes Wieting, Richard, MD  ondansetron (ZOFRAN) 8 MG tablet Take 1 tablet (8 mg total) by mouth every 8 (eight) hours as needed for nausea or vomiting. 08/23/18  Yes Karen Kitchens, NP  pantoprazole (PROTONIX) 40 MG tablet Take 1 tablet (40 mg total) by mouth daily. 08/01/18  Yes Wieting, Richard, MD  phenazopyridine (PYRIDIUM) 200 MG tablet Take 1 tablet (200 mg total) by mouth 3 (three) times daily as needed for pain. 10/07/18  Yes Jacquelin Hawking, NP  promethazine (PHENERGAN) 25 MG tablet Take 1 tablet (25 mg total) by mouth every 6 (six) hours as needed for nausea or vomiting. 10/02/18  Yes Karen Kitchens, NP  rivaroxaban (XARELTO) 20 MG TABS tablet Take 1 tablet (20 mg total) by mouth daily with supper. 08/27/18  Yes Karen Kitchens, NP  Calcium Carb-Cholecalciferol (CALCIUM 500 +D) 500-400 MG-UNIT TABS Take 2 tablets by mouth daily.    [provider]  colestipol (COLESTID) 1 g tablet Take 2 tablets (2 g total) by mouth 3 (three) times daily with meals. Patient not taking: Reported on 10/08/2018 08/01/18   Loletha Grayer, MD  cyanocobalamin (,VITAMIN B-12,) 1000 MCG/ML injection Inject 1ML every week x 4 weeks, then 1ML every other week x 2 months, then monthly x 3 months Patient not taking: Reported on 10/08/2018 11/29/17   [name], MD  Syringe/Needle, Disp, (SYRINGE 3CC/22GX1") 22G X 1" 3 ML MISC For use with Vitamin B12 injections 04/12/18   Vanga,  Tally Due, MD  traZODone (DESYREL) 50 MG tablet Take 0.5-1 tablets (25-50 mg total) by mouth at bedtime as needed for sleep. Patient not taking: Reported on 10/08/2018 08/13/18   Karen Kitchens, NP  VENTOLIN HFA 108 (90 Base) MCG/ACT inhaler Inhale 1-2 puffs into the lungs every 4 (four) hours as needed for shortness of breath.     [provider]    Allergies as of 10/08/2018 - Review Complete 10/08/2018  Allergen Reaction Noted  . Ketamine Anxiety and Other (See Comments) 02/22/2017    Family History  Problem Relation Age of Onset  . Hypertension Father   . Diabetes Father   . Alcohol abuse Daughter   . Hypertension Maternal Grandmother   . Diabetes Maternal Grandmother   . Diabetes Paternal Grandmother   . Hypertension Paternal Grandmother     Social History   Socioeconomic History  . Marital status: Divorced    Spouse name:  Not on file  . Number of children: Not on file  . Years of education: Not on file  . Highest education level: Not on file  Occupational History  . Not on file  Social Needs  . Financial resource strain: Not hard at all  . Food insecurity:    Worry: Never true    Inability: Never true  . Transportation needs:    Medical: No    Non-medical: No  Tobacco Use  . Smoking status: Former Smoker    Last attempt to quit: 10/16/2006    Years since quitting: 11.9  . Smokeless tobacco: Never Used  Substance and Sexual Activity  . Alcohol use: Yes    Frequency: Never    Comment: seldom  . Drug use: Yes    Types: Marijuana  . Sexual activity: Not Currently    Birth control/protection: Post-menopausal    Comment: Not Asked  Lifestyle  . Physical activity:    Days per week: Patient refused    Minutes per session: Patient refused  . Stress: Only a little  Relationships  . Social connections:    Talks on phone: Patient refused    Gets together: Patient refused    Attends religious service: Patient refused    Active member of club or  organization: Patient refused    Attends meetings of clubs or organizations: Patient refused    Relationship status: Patient refused  . Intimate partner violence:    Fear of current or ex partner: No    Emotionally abused: No    Physically abused: No    Forced sexual activity: No  Other Topics Concern  . Not on file  Social History Narrative  . Not on file    Review of Systems: See HPI, otherwise negative ROS  Physical Exam: BP 134/80 (BP Location: Right Arm)   Pulse 65   Temp (!) 97.5 F (36.4 C) (Oral)   Resp 20   Ht 5' 8"  (1.727 m)   Wt 80 kg   SpO2 98%   BMI 26.82 kg/m  General:   Alert,  pleasant and cooperative in NAD Head:  Normocephalic and atraumatic. Neck:  Supple; no masses or thyromegaly. Lungs:  Clear throughout to auscultation, normal respiratory effort.    Heart:  +S1, +S2, Regular rate and rhythm, No edema. Abdomen:  Soft, nontender and nondistended. Normal bowel sounds, without guarding, and without rebound.   Neurologic:  Alert and  oriented x4;  grossly normal neurologically.  Impression/Plan: Joanna Hall is here for an colonoscopy to be performed for  Anemia. Risks, benefits, limitations, and alternatives regarding  colonoscopy have been reviewed with the patient.  Questions have been answered.  All parties agreeable.   Jonathon Bellows, MD  10/10/2018, 8:57 AM

## 2018-10-10 NOTE — Progress Notes (Addendum)
[place] [facility] [place] NAME: Joanna Hall    MR#:  [id number]  DATE OF BIRTH:  [birth year]  SUBJECTIVE:  CHIEF COMPLAINT:  Pt denies any more bleed last night. Doing ok  Colonoscopy has revealed bleeding terminal ileum ulcers REVIEW OF SYSTEMS:  CONSTITUTIONAL: No fever, fatigue or weakness.  EYES: No blurred or double vision.  EARS, NOSE, AND THROAT: No tinnitus or ear pain.  RESPIRATORY: No cough, shortness of breath, wheezing or hemoptysis.  CARDIOVASCULAR: No chest pain, orthopnea, edema.  GASTROINTESTINAL: No nausea, vomiting, diarrhea or abdominal pain.  GENITOURINARY: No dysuria, hematuria.  ENDOCRINE: No polyuria, nocturia,  HEMATOLOGY: No anemia, easy bruising or bleeding SKIN: No rash or lesion. MUSCULOSKELETAL: No joint pain or arthritis.   NEUROLOGIC: No tingling, numbness, weakness.  PSYCHIATRY: No anxiety or depression.   DRUG ALLERGIES:   Allergies  Allergen Reactions  . Ketamine Anxiety and Other (See Comments)    Syncope episode/confusion     VITALS:  Blood pressure (!) 153/77, pulse 77, temperature 98.3 F (36.8 C), temperature source Oral, resp. rate 19, height 5' 8"  (1.727 m), weight 80 kg, SpO2 99 %.  PHYSICAL EXAMINATION:  GENERAL:  52 y.o.-year-old patient lying in the bed with no acute distress.  EYES: Pupils equal, round, reactive to light and accommodation. No scleral icterus. Extraocular muscles intact.  HEENT: Head atraumatic, normocephalic. Oropharynx and nasopharynx clear.  NECK:  Supple, no jugular venous distention. No thyroid enlargement, no tenderness.  LUNGS: Normal breath sounds bilaterally, no wheezing, rales,rhonchi or crepitation. No use of accessory muscles of respiration.  CARDIOVASCULAR: S1, S2 normal. No murmurs, rubs, or gallops.  ABDOMEN: Soft, nontender, nondistended. Bowel sounds present. EXTREMITIES: No pedal edema, cyanosis, or clubbing.  NEUROLOGIC:Awake ,alert .  Sensation intact. Gait not checked.  PSYCHIATRIC: The patient is alert and oriented x 3.  SKIN: No obvious rash, lesion, or ulcer.    LABORATORY PANEL:   CBC Recent Labs  Lab 10/10/18 0518  WBC 3.5*  HGB 10.4*  HCT 31.5*  PLT 61*   ------------------------------------------------------------------------------------------------------------------  Chemistries  Recent Labs  Lab 10/08/18 0802 10/09/18 0500  NA 138 137  K 3.3* 3.3*  CL 108 110  CO2 25 23  GLUCOSE 118* 89  BUN 13 7  CREATININE 0.66 0.61  CALCIUM 8.7* 8.4*  MG 1.7  --   AST  --  23  ALT  --  31  ALKPHOS  --  128*  BILITOT  --  0.5   ------------------------------------------------------------------------------------------------------------------  Cardiac Enzymes No results for input(s): TROPONINI in the last 168 hours. ------------------------------------------------------------------------------------------------------------------  RADIOLOGY:  No results found.  EKG:   Orders placed or performed during the hospital encounter of 06/10/18  . ED EKG  . ED EKG  . EKG    ASSESSMENT AND PLAN:    Lower GI bleed- Seen by gastroenterology colonoscopy has revealed 3 bleeding terminal ileal ulcers but no anastomotic ulcer Resume diet Hemoglobin 13-8.9 s/p PRBC - 11.1-10.9 today Close monitoring of the hemoglobin and hematocrit and transfuse as needed Xarelto on hold.  Okay to resume Xarelto in  if hemoglobin is stable from GI standpoint by Dr. Mike Gip is recommending to hold Xarelto until biopsy results are available.  We will continue to hold Xarelto Gentle hydration with IV fluids, PPI   Essential hypertension-blood pressure is low normal, hold antihypertensive Gentle hydration with IV fluids  Acute on chronic diarrhea- Check GI panel and C. difficile panel ordered but sample not collected  so far  Thrombocytopenia with platelet count 61,000 No active bleeding.  Continue close monitoring of  the platelet count Xarelto on hold, until seen by oncology and biopsy results are back per Dr. Kem Parkinson recommendations  DVT prophylaxis with SCDs    All the records are reviewed and case discussed with Care Management/Social Workerr. Management plans discussed with the patient, she  is in agreement CODE STATUS:   TOTAL TIME TAKING CARE OF THIS PATIENT: 36 minutes.   POSSIBLE D/C IN 1-2  DAYS, DEPENDING ON CLINICAL CONDITION.  Note: This dictation was prepared with Dragon dictation along with smaller phrase technology. Any transcriptional errors that result from this process are unintentional.   Nicholes Mango M.D on 10/10/2018 at 12:52 PM  Between 7am to 6pm - Pager - [phone] After 6pm go to [email or web address] - password [email or web address] [facility]  Office  [phone]  CC: Primary care physician; Arnetha Courser, MD

## 2018-10-10 NOTE — Op Note (Signed)
[facility] Gastroenterology Patient Name: Joanna Hall Procedure Date: 10/10/2018 9:06 AM MRN: [id number] Account #: [id number] Date of Birth: 1966-07-17 Admit Type: Inpatient Age: 52 Room: [facility] ENDO ROOM 4 Gender: Female Note Status: Finalized Procedure:            Colonoscopy Indications:          Rectal bleeding Providers:            Jonathon Bellows MD, MD Medicines:            Monitored Anesthesia Care Complications:        No immediate complications. Procedure:            Pre-Anesthesia Assessment:                       - Prior to the procedure, a History and Physical was                        performed, and patient medications, allergies and                        sensitivities were reviewed. The patient's tolerance of                        previous anesthesia was reviewed.                       - The risks and benefits of the procedure and the                        sedation options and risks were discussed with the                        patient. All questions were answered and informed                        consent was obtained.                       - ASA Grade Assessment: III - A patient with severe                        systemic disease.                       After obtaining informed consent, the colonoscope was                        passed under direct vision. Throughout the procedure,                        the patient's blood pressure, pulse, and oxygen                        saturations were monitored continuously. The                        Colonoscope was introduced through the and advanced to                        the the terminal ileum. The colonoscopy was performed  with ease. The patient tolerated the procedure well.                        The quality of the bowel preparation was adequate. Findings:      The perianal and digital rectal examinations were normal.      There was evidence of a prior end-to-side  ileo-colonic anastomosis in       the transverse colon. This was patent and was characterized by healthy       appearing mucosa. The anastomosis was traversed.      The terminal ileum contained 3 [Size] ulcers. [Bleeding]. [Stigmata].       Biopsies were taken with a cold forceps for histology.      The exam was otherwise without abnormality. Impression:           - Patent end-to-side ileo-colonic anastomosis,                        characterized by healthy appearing mucosa.                       - Ulcers in the terminal ileum. Biopsied.                       - The examination was otherwise normal. Recommendation:       - Return patient to hospital ward for ongoing care.                       - Advance diet as tolerated.                       - Continue present medications.                       - Await pathology results.                       - Bleeding likely from ulcers from the terminal ileum.                        They were away from the anastamosis making anastamotic                        ulcer less likely, could be from crohns disease as well                        No active bleeding now- suggest can commence on heparin                        or lovenox and if stable for 24 hours then can change                        back to xarelto.                       Once biopsies are back will need close follow up with                        Dr Marius Ditch Procedure Code(s):    --- Professional ---  45380, Colonoscopy, flexible; with biopsy, single or                        multiple Diagnosis Code(s):    --- Professional ---                       Z98.0, Intestinal bypass and anastomosis status                       K63.3, Ulcer of intestine                       K62.5, Hemorrhage of anus and rectum CPT copyright 2018 American Medical Association. All rights reserved. The codes documented in this report are preliminary and upon coder review may  be revised to meet current  compliance requirements. Jonathon Bellows, MD Jonathon Bellows MD, MD 10/10/2018 9:27:06 AM This report has been signed electronically. Number of Addenda: 0 Note Initiated On: 10/10/2018 9:06 AM Scope Withdrawal Time: 0 hours 6 minutes 35 seconds  Total Procedure Duration: 0 hours 8 minutes 37 seconds       [facility]

## 2018-10-10 NOTE — Anesthesia Post-op Follow-up Note (Signed)
Anesthesia QCDR form completed.        

## 2018-10-11 ENCOUNTER — Telehealth: Payer: Self-pay | Admitting: *Deleted

## 2018-10-11 ENCOUNTER — Other Ambulatory Visit: Payer: Self-pay | Admitting: *Deleted

## 2018-10-11 ENCOUNTER — Other Ambulatory Visit: Payer: Self-pay | Admitting: Nurse Practitioner

## 2018-10-11 ENCOUNTER — Encounter: Payer: Self-pay | Admitting: Gastroenterology

## 2018-10-11 ENCOUNTER — Inpatient Hospital Stay: Payer: Medicaid Other

## 2018-10-11 ENCOUNTER — Encounter: Payer: Self-pay | Admitting: Hematology and Oncology

## 2018-10-11 DIAGNOSIS — C530 Malignant neoplasm of endocervix: Secondary | ICD-10-CM

## 2018-10-11 LAB — CBC
HCT: 32.8 % — ABNORMAL LOW (ref 36.0–46.0)
HEMOGLOBIN: 11 g/dL — AB (ref 12.0–15.0)
MCH: 32.8 pg (ref 26.0–34.0)
MCHC: 33.5 g/dL (ref 30.0–36.0)
MCV: 97.9 fL (ref 80.0–100.0)
Platelets: 69 10*3/uL — ABNORMAL LOW (ref 150–400)
RBC: 3.35 MIL/uL — ABNORMAL LOW (ref 3.87–5.11)
RDW: 15.1 % (ref 11.5–15.5)
WBC: 4.3 10*3/uL (ref 4.0–10.5)
nRBC: 0 % (ref 0.0–0.2)

## 2018-10-11 LAB — HIV ANTIBODY (ROUTINE TESTING W REFLEX): HIV Screen 4th Generation wRfx: NONREACTIVE

## 2018-10-11 MED ORDER — HEPARIN SOD (PORK) LOCK FLUSH 100 UNIT/ML IV SOLN
500.0000 [IU] | Freq: Once | INTRAVENOUS | Status: AC
  Administered 2018-10-11: 500 [IU] via INTRAVENOUS
  Filled 2018-10-11: qty 5

## 2018-10-11 MED ORDER — BUDESONIDE 3 MG PO CPEP: 9.0000 mg | ORAL_CAPSULE | Freq: Every day | ORAL | 0 refills | Status: DC

## 2018-10-11 MED ORDER — DIPHENOXYLATE-ATROPINE 2.5-0.025 MG PO TABS: 2.0000 | ORAL_TABLET | Freq: Four times a day (QID) | ORAL | 0 refills | Status: DC

## 2018-10-11 NOTE — Telephone Encounter (Signed)
Schedule patient for labs, see NP Monday, December 30 Per Rodena Piety 10/11/18 staff message.  Appt was scheduled as requested. I notified patient and made her aware of her scheduled appt Patient is aware of date and time!!

## 2018-10-11 NOTE — Progress Notes (Signed)
Patient discharge teaching given, including activity, diet, follow-up appoints, and medications. Patient verbalized understanding of all discharge instructions. IV access was d/c'd. Vitals are stable. Skin is intact except as charted in most recent assessments. Pt to be escorted out by NT, to be driven home by family.  Marshae Azam [employer]

## 2018-10-11 NOTE — Discharge Summary (Signed)
[facility] NAME: Joanna Hall    MR#:  [id number]  DATE OF BIRTH:  December 04, 1965  DATE OF ADMISSION:  10/08/2018 ADMITTING PHYSICIAN: Nicholes Mango, MD  DATE OF DISCHARGE:  10/11/18  PRIMARY CARE PHYSICIAN: Arnetha Courser, MD    ADMISSION DIAGNOSIS:  Hematochezia [K92.1] Acute on chronic blood loss anemia [D62]  DISCHARGE DIAGNOSIS:  Active Problems:   GI bleed   Acute respiratory failure with hypoxia (HCC)   Hematochezia  Terminal ileal ulcers Stage IV cervical cancer on chemotherapy Chronic diarrhea SECONDARY DIAGNOSIS:   Past Medical History:  Diagnosis Date  . Abnormal cervical Papanicolaou smear 09/18/2017  . Anxiety   . Aortic atherosclerosis ([place])   . Arthritis    neck and knees  . 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 Blood transfusion without reported diagnosis   . Cervical cancer (HCC)    mets lung  . Chronic anal fissure   . Chronic diarrhea   . Dyspnea   . Erosive gastropathy 09/18/2017  . Factor V Leiden mutation ([place])   . Fecal incontinence   . Genital warts   . GERD (gastroesophageal reflux disease)   . Heart murmur   . Hemorrhoids   . 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 Hepatitis, chronic ([place]) 05/05/2017  . History of cancer chemotherapy    completed 06/2017  . History of Clostridium difficile infection    while undergoing chemo.  Negative test 10/2017  . Ileocolic anastomotic leak   . 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 Intestinal infection due to Clostridium difficile 09/18/2017  . Macrocytic anemia with vitamin B12 deficiency   . Nausea vomiting and diarrhea   . Pancolitis ([place]) 07/27/2018  . Perianal condylomata   . Pneumonia    History of  . Pulmonary nodules   . Rectal bleeding   . Small bowel obstruction ([place]) 08/2017  . Stiff neck    limited right turn  . Vitamin D deficiency     HOSPITAL COURSE:    Lower GI bleed- Seen by gastroenterology colonoscopy has revealed 3 bleeding terminal ileal ulcers but no anastomotic ulcer, outpatient follow-up with Dr. Marius Ditch Entocort EC  Tolerating advanced diet Hemoglobin 13-8.9 s/p PRBC - 11.1-10.9-11  today Close monitoring of the hemoglobin and hematocrit and transfuse as needed Xarelto on hold.  Dr. Mike Gip is recommending to hold Xarelto until biopsy results are available.  We will continue to hold Xarelto Gentle hydration with IV fluids provided during the hospital course, PPI   Essential hypertension-blood pressure is low normal,hold antihypertensive Gentle hydration with IV fluids   chronic diarrhea- Patient reports she is at her baseline  Thrombocytopenia with platelet count 61,000- 69.000 No active bleeding. Continue close monitoring of the platelet count Xarelto on hold, until seen by oncology and biopsy results are back per Dr. Kem Parkinson recommendations  DVT prophylaxis with SCDs  DISCHARGE CONDITIONS:  Stable   CONSULTS OBTAINED:  Treatment Team:  Lucilla Lame, MD Lequita Asal, MD   PROCEDURES  Colonoscopy   DRUG ALLERGIES:   Allergies  Allergen Reactions  . Ketamine Anxiety and Other (See Comments)    Syncope episode/confusion     DISCHARGE MEDICATIONS:   Allergies as of 10/11/2018      Reactions   Ketamine Anxiety, Other (See Comments)   Syncope episode/confusion  Medication List    STOP taking these medications   colestipol 1 g tablet Commonly known as:  COLESTID   rivaroxaban 20 MG Tabs tablet Commonly known as:  XARELTO     TAKE these medications   amitriptyline 50 MG tablet Commonly known as:  ELAVIL Take 1 tablet (50 mg total) by mouth at bedtime.   budesonide 3 MG 24 hr capsule Commonly known as:  ENTOCORT EC Take 3 capsules (9 mg total) by mouth daily. Start taking on:  October 12, 2018   CALCIUM 500 +D 500-400 MG-UNIT Tabs Generic drug:  Calcium  Carb-Cholecalciferol Take 2 tablets by mouth daily.   cyanocobalamin 1000 MCG/ML injection Commonly known as:  (VITAMIN B-12) Inject 1ML every week x 4 weeks, then 1ML every other week x 2 months, then monthly x 3 months   diphenoxylate-atropine 2.5-0.025 MG tablet Commonly known as:  LOMOTIL Take 2 tablets by mouth 4 (four) times daily.   Eluxadoline 100 MG Tabs Commonly known as:  VIBERZI Take 1 tablet (100 mg total) by mouth 2 (two) times daily with a meal.   HYDROcodone-acetaminophen 5-325 MG tablet Commonly known as:  NORCO/VICODIN Take 1 tablet by mouth every 6 (six) hours as needed for moderate pain.   hydrocortisone 10 MG tablet Commonly known as:  CORTEF Take 10 mg by mouth 3 (three) times daily.   multivitamin with minerals tablet Take 1 tablet by mouth daily.   OLANZapine zydis 5 MG disintegrating tablet Commonly known as:  ZYPREXA Take 1 tablet (5 mg total) by mouth at bedtime.   ondansetron 8 MG tablet Commonly known as:  ZOFRAN Take 1 tablet (8 mg total) by mouth every 8 (eight) hours as needed for nausea or vomiting.   pantoprazole 40 MG tablet Commonly known as:  PROTONIX Take 1 tablet (40 mg total) by mouth daily.   phenazopyridine 200 MG tablet Commonly known as:  PYRIDIUM Take 1 tablet (200 mg total) by mouth 3 (three) times daily as needed for pain.   promethazine 25 MG tablet Commonly known as:  PHENERGAN Take 1 tablet (25 mg total) by mouth every 6 (six) hours as needed for nausea or vomiting.   SYRINGE 3CC/22GX1" 22G X 1" 3 ML Misc For use with Vitamin B12 injections   traZODone 50 MG tablet Commonly known as:  DESYREL Take 0.5-1 tablets (25-50 mg total) by mouth at bedtime as needed for sleep.   VENTOLIN HFA 108 (90 Base) MCG/ACT inhaler Generic drug:  albuterol Inhale 1-2 puffs into the lungs every 4 (four) hours as needed for shortness of breath.        DISCHARGE INSTRUCTIONS:  Follow-up with Dr. Mike Gip on Monday Follow-up  with Dr. Marius Ditch in a week Follow-up with primary care physician in 3 to 4 days   DIET:  Regular diet  DISCHARGE CONDITION:  Fair  ACTIVITY:  Activity as tolerated  OXYGEN:  Home Oxygen: No.   Oxygen Delivery: room air  DISCHARGE LOCATION:  home   If you experience worsening of your admission symptoms, develop shortness of breath, life threatening emergency, suicidal or homicidal thoughts you must seek medical attention immediately by calling 911 or calling your MD immediately  if symptoms less severe.  You Must read complete instructions/literature along with all the possible adverse reactions/side effects for all the Medicines you take and that have been prescribed to you. Take any new Medicines after you have completely understood and accpet all the possible adverse reactions/side effects.   Please note  You were cared for by a hospitalist during your hospital stay. If you have any questions about your discharge medications or the care you received while you were in the hospital after you are discharged, you can call the unit and asked to speak with the hospitalist on call if the hospitalist that took care of you is not available. Once you are discharged, your primary care physician will handle any further medical issues. Please note that NO REFILLS for any discharge medications will be authorized once you are discharged, as it is imperative that you return to your primary care physician (or establish a relationship with a primary care physician if you do not have one) for your aftercare needs so that they can reassess your need for medications and monitor your lab values.     Today  Chief Complaint  Patient presents with  . Abnormal Lab   Patient is doing fine.  No other episodes of bleeding has chronic abdominal pain and chronic diarrhea okay to discharge patient from a oncology and GI standpoint Xerox on hold until patient gets biopsy results  ROS:  CONSTITUTIONAL: Denies  fevers, chills. Denies any fatigue, weakness.  EYES: Denies blurry vision, double vision, eye pain. EARS, NOSE, THROAT: Denies tinnitus, ear pain, hearing loss. RESPIRATORY: Denies cough, wheeze, shortness of breath.  CARDIOVASCULAR: Denies chest pain, palpitations, edema.  GASTROINTESTINAL: Denies nausea, vomiting, patient has history of chronic diarrhea, chronic abdominal pain. Denies bright red blood per rectum. GENITOURINARY: Denies dysuria, hematuria. ENDOCRINE: Denies nocturia or thyroid problems. HEMATOLOGIC AND LYMPHATIC: Denies easy bruising or bleeding. SKIN: Denies rash or lesion. MUSCULOSKELETAL: Denies pain in neck, back, shoulder, knees, hips or arthritic symptoms.  NEUROLOGIC: Denies paralysis, paresthesias.  PSYCHIATRIC: Denies anxiety or depressive symptoms.   VITAL SIGNS:  Blood pressure (!) 153/91, pulse 76, temperature 97.6 F (36.4 C), temperature source Oral, resp. rate 16, height 5' 8"  (1.727 m), weight 80 kg, SpO2 97 %.  I/O:    Intake/Output Summary (Last 24 hours) at 10/11/2018 1154 Last data filed at 10/11/2018 0952 Gross per 24 hour  Intake 740.1 ml  Output -  Net 740.1 ml    PHYSICAL EXAMINATION:  GENERAL:  52 y.o.-year-old patient lying in the bed with no acute distress.  EYES: Pupils equal, round, reactive to light and accommodation. No scleral icterus. Extraocular muscles intact.  HEENT: Head atraumatic, normocephalic. Oropharynx and nasopharynx clear.  NECK:  Supple, no jugular venous distention. No thyroid enlargement, no tenderness.  LUNGS: Normal breath sounds bilaterally, no wheezing, rales,rhonchi or crepitation. No use of accessory muscles of respiration.  CARDIOVASCULAR: S1, S2 normal. No murmurs, rubs, or gallops.  ABDOMEN: Soft, non-tender, non-distended. Bowel sounds present. No organomegaly or mass.  EXTREMITIES: No pedal edema, cyanosis, or clubbing.  NEUROLOGIC: Awake, alert and oriented x3 sensation intact. Gait not checked.   PSYCHIATRIC: The patient is alert and oriented x 3.  SKIN: No obvious rash, lesion, or ulcer.   DATA REVIEW:   CBC Recent Labs  Lab 10/11/18 0512  WBC 4.3  HGB 11.0*  HCT 32.8*  PLT 69*    Chemistries  Recent Labs  Lab 10/08/18 0802 10/09/18 0500  NA 138 137  K 3.3* 3.3*  CL 108 110  CO2 25 23  GLUCOSE 118* 89  BUN 13 7  CREATININE 0.66 0.61  CALCIUM 8.7* 8.4*  MG 1.7  --   AST  --  23  ALT  --  31  ALKPHOS  --  128*  BILITOT  --  0.5    Cardiac Enzymes No results for input(s): TROPONINI in the last 168 hours.  Microbiology Results  Results for orders placed or performed during the hospital encounter of 10/08/18  C Difficile Quick Screen w PCR reflex     Status: None   Collection Time: 10/08/18  7:01 PM  Result Value Ref Range Status   C Diff antigen NEGATIVE NEGATIVE Final   C Diff toxin NEGATIVE NEGATIVE Final   C Diff interpretation No C. difficile detected.  Final    Comment: Performed at [facility], [address] [place], [place] [address]  Gastrointestinal Panel by PCR , Stool     Status: None   Collection Time: 10/08/18  7:01 PM  Result Value Ref Range Status   Campylobacter species NOT DETECTED NOT DETECTED Final   Plesimonas shigelloides NOT DETECTED NOT DETECTED Final   Salmonella species NOT DETECTED NOT DETECTED Final   Yersinia enterocolitica NOT DETECTED NOT DETECTED Final   Vibrio species NOT DETECTED NOT DETECTED Final   Vibrio cholerae NOT DETECTED NOT DETECTED Final   Enteroaggregative E coli (EAEC) NOT DETECTED NOT DETECTED Final   Enteropathogenic E coli (EPEC) NOT DETECTED NOT DETECTED Final   Enterotoxigenic E coli (ETEC) NOT DETECTED NOT DETECTED Final   Shiga like toxin producing E coli (STEC) NOT DETECTED NOT DETECTED Final   Shigella/Enteroinvasive E coli (EIEC) NOT DETECTED NOT DETECTED Final   Cryptosporidium NOT DETECTED NOT DETECTED Final   Cyclospora cayetanensis NOT DETECTED NOT DETECTED Final   Entamoeba  histolytica NOT DETECTED NOT DETECTED Final   Giardia lamblia NOT DETECTED NOT DETECTED Final   Adenovirus F40/41 NOT DETECTED NOT DETECTED Final   Astrovirus NOT DETECTED NOT DETECTED Final   Norovirus GI/GII NOT DETECTED NOT DETECTED Final   Rotavirus A NOT DETECTED NOT DETECTED Final   Sapovirus (I, II, IV, and V) NOT DETECTED NOT DETECTED Final    Comment: Performed at [facility], [address] [place], [place] [address]    RADIOLOGY:  No results found.  EKG:   Orders placed or performed during the hospital encounter of 06/10/18  . ED EKG  . ED EKG  . EKG      Management plans discussed with the patient, family and they are in agreement.  CODE STATUS:     Code Status Orders  (From admission, onward)         Start     Ordered   10/08/18 1443  Full code  Continuous     10/08/18 1442        Code Status History    Date Active Date Inactive Code Status Order ID Comments User Context   07/27/2018 1621 08/01/2018 1432 Full Code [id number]  Demetrios Loll, MD Inpatient   06/10/2018 1754 06/12/2018 1614 Full Code [id number]  Dustin Flock, MD Inpatient   04/24/2018 1451 04/27/2018 1700 Full Code [id number]  Lovell Sheehan, MD Inpatient   12/03/2017 1619 12/08/2017 1606 Full Code [id number]  Epifanio Lesches, MD ED   09/12/2017 0024 09/15/2017 1721 Full Code [id number]  [name], MD ED   08/27/2017 0223 09/05/2017 1355 Full Code [id number]  Herbert Pun, MD Inpatient    Advance Directive Documentation     Most Recent Value  Type of Advance Directive  Healthcare Power of Attorney, Living will  Pre-existing out of facility DNR order (yellow form or pink MOST form)  -  "MOST" Form in Place?  -      [place]  OF THIS PATIENT: 43  minutes.   Note: This dictation was prepared with Dragon dictation along with smaller phrase technology. Any transcriptional errors that result from this process are unintentional.   @[facility] @  on 10/11/2018 at  11:54 AM  Between 7am to 6pm - Pager - [phone]  After 6pm go to [email or web address] - password [email or web address] [facility]  Office  [phone]  CC: Primary care physician; Arnetha Courser, MD

## 2018-10-11 NOTE — Discharge Instructions (Signed)
° °  Gastrointestinal Bleeding  Gastrointestinal bleeding is bleeding somewhere along the path food travels through the body (digestive tract). This path is anywhere between the mouth and the opening of the butt (anus). You may have blood in your poop (stools) or have black poop. If you throw up (vomit), there may be blood in it. This condition can be mild, serious, or even life-threatening. If you have a lot of bleeding, you may need to stay in the hospital. Follow these instructions at home:  Take over-the-counter and prescription medicines only as told by your doctor.  Eat foods that have a lot of fiber in them. These foods include whole grains, fruits, and vegetables. You can also try eating 1-3 prunes each day.  Drink enough fluid to keep your pee (urine) clear or pale yellow.  Keep all follow-up visits as told by your doctor. This is important. Contact a doctor if:  Your symptoms do not get better. Get help right away if:  Your bleeding gets worse.  You feel dizzy or you pass out (faint).  You feel weak.  You have very bad cramps in your back or belly (abdomen).  You pass large clumps of blood (clots) in your poop.  Your symptoms are getting worse. This information is not intended to replace advice given to you by your health care provider. Make sure you discuss any questions you have with your health care provider. Document Released: 07/11/2008 Document Revised: 03/09/2016 Document Reviewed: 03/22/2015 Elsevier Interactive Patient Education  2019 [place] with Dr. Mike Gip on Monday Follow-up with Dr. Marius Ditch in a week Follow-up with primary care physician in 3 to 4 days

## 2018-10-14 ENCOUNTER — Encounter: Payer: Self-pay | Admitting: Hematology and Oncology

## 2018-10-14 ENCOUNTER — Telehealth: Payer: Self-pay

## 2018-10-14 ENCOUNTER — Inpatient Hospital Stay: Payer: Medicaid Other

## 2018-10-14 ENCOUNTER — Inpatient Hospital Stay (HOSPITAL_BASED_OUTPATIENT_CLINIC_OR_DEPARTMENT_OTHER): Payer: Medicaid Other | Admitting: Urgent Care

## 2018-10-14 ENCOUNTER — Encounter: Payer: Self-pay | Admitting: Urgent Care

## 2018-10-14 VITALS — BP 126/88 | HR 96 | Temp 97.1°F | Resp 18 | Ht 68.0 in | Wt 177.5 lb

## 2018-10-14 DIAGNOSIS — E538 Deficiency of other specified B group vitamins: Secondary | ICD-10-CM | POA: Diagnosis not present

## 2018-10-14 DIAGNOSIS — Z79899 Other long term (current) drug therapy: Secondary | ICD-10-CM

## 2018-10-14 DIAGNOSIS — D649 Anemia, unspecified: Secondary | ICD-10-CM | POA: Diagnosis not present

## 2018-10-14 DIAGNOSIS — E871 Hypo-osmolality and hyponatremia: Secondary | ICD-10-CM

## 2018-10-14 DIAGNOSIS — A0472 Enterocolitis due to Clostridium difficile, not specified as recurrent: Secondary | ICD-10-CM | POA: Diagnosis not present

## 2018-10-14 DIAGNOSIS — K625 Hemorrhage of anus and rectum: Secondary | ICD-10-CM

## 2018-10-14 DIAGNOSIS — K648 Other hemorrhoids: Secondary | ICD-10-CM | POA: Diagnosis not present

## 2018-10-14 DIAGNOSIS — C538 Malignant neoplasm of overlapping sites of cervix uteri: Secondary | ICD-10-CM | POA: Diagnosis not present

## 2018-10-14 DIAGNOSIS — C7800 Secondary malignant neoplasm of unspecified lung: Secondary | ICD-10-CM

## 2018-10-14 DIAGNOSIS — E860 Dehydration: Secondary | ICD-10-CM

## 2018-10-14 DIAGNOSIS — Z96652 Presence of left artificial knee joint: Secondary | ICD-10-CM | POA: Diagnosis not present

## 2018-10-14 DIAGNOSIS — R1031 Right lower quadrant pain: Secondary | ICD-10-CM

## 2018-10-14 DIAGNOSIS — Z923 Personal history of irradiation: Secondary | ICD-10-CM | POA: Diagnosis not present

## 2018-10-14 DIAGNOSIS — Z87891 Personal history of nicotine dependence: Secondary | ICD-10-CM | POA: Diagnosis not present

## 2018-10-14 DIAGNOSIS — R748 Abnormal levels of other serum enzymes: Secondary | ICD-10-CM | POA: Diagnosis not present

## 2018-10-14 DIAGNOSIS — D6851 Activated protein C resistance: Secondary | ICD-10-CM | POA: Diagnosis not present

## 2018-10-14 DIAGNOSIS — R110 Nausea: Secondary | ICD-10-CM

## 2018-10-14 DIAGNOSIS — E2740 Unspecified adrenocortical insufficiency: Secondary | ICD-10-CM | POA: Diagnosis not present

## 2018-10-14 DIAGNOSIS — R300 Dysuria: Secondary | ICD-10-CM | POA: Diagnosis not present

## 2018-10-14 DIAGNOSIS — E876 Hypokalemia: Secondary | ICD-10-CM

## 2018-10-14 DIAGNOSIS — R197 Diarrhea, unspecified: Secondary | ICD-10-CM | POA: Diagnosis not present

## 2018-10-14 DIAGNOSIS — C530 Malignant neoplasm of endocervix: Secondary | ICD-10-CM

## 2018-10-14 DIAGNOSIS — D696 Thrombocytopenia, unspecified: Secondary | ICD-10-CM | POA: Diagnosis not present

## 2018-10-14 DIAGNOSIS — Z5111 Encounter for antineoplastic chemotherapy: Secondary | ICD-10-CM | POA: Diagnosis present

## 2018-10-14 DIAGNOSIS — N939 Abnormal uterine and vaginal bleeding, unspecified: Secondary | ICD-10-CM

## 2018-10-14 LAB — CBC WITH DIFFERENTIAL/PLATELET
Abs Immature Granulocytes: 0.04 10*3/uL (ref 0.00–0.07)
Basophils Absolute: 0 10*3/uL (ref 0.0–0.1)
Basophils Relative: 0 %
Eosinophils Absolute: 0.1 10*3/uL (ref 0.0–0.5)
Eosinophils Relative: 1 %
HCT: 38.3 % (ref 36.0–46.0)
Hemoglobin: 12.7 g/dL (ref 12.0–15.0)
Immature Granulocytes: 1 %
Lymphocytes Relative: 14 %
Lymphs Abs: 0.6 10*3/uL — ABNORMAL LOW (ref 0.7–4.0)
MCH: 33.1 pg (ref 26.0–34.0)
MCHC: 33.2 g/dL (ref 30.0–36.0)
MCV: 99.7 fL (ref 80.0–100.0)
Monocytes Absolute: 0.2 10*3/uL (ref 0.1–1.0)
Monocytes Relative: 5 %
Neutro Abs: 3.5 10*3/uL (ref 1.7–7.7)
Neutrophils Relative %: 79 %
Platelets: 74 10*3/uL — ABNORMAL LOW (ref 150–400)
RBC: 3.84 MIL/uL — ABNORMAL LOW (ref 3.87–5.11)
RDW: 15 % (ref 11.5–15.5)
WBC: 4.5 10*3/uL (ref 4.0–10.5)
nRBC: 0 % (ref 0.0–0.2)

## 2018-10-14 LAB — BASIC METABOLIC PANEL
Anion gap: 9 (ref 5–15)
BUN: 15 mg/dL (ref 6–20)
CO2: 30 mmol/L (ref 22–32)
Calcium: 9.1 mg/dL (ref 8.9–10.3)
Chloride: 102 mmol/L (ref 98–111)
Creatinine, Ser: 0.67 mg/dL (ref 0.44–1.00)
GFR calc Af Amer: >60 mL/min (ref >60)
GFR calc non Af Amer: >60 mL/min (ref >60)
Glucose, Bld: 110 mg/dL — ABNORMAL HIGH (ref 70–99)
Potassium: 3.4 mmol/L — ABNORMAL LOW (ref 3.5–5.1)
Sodium: 141 mmol/L (ref 135–145)

## 2018-10-14 LAB — MAGNESIUM: Magnesium: 1.3 mg/dL — ABNORMAL LOW (ref 1.7–2.4)

## 2018-10-14 LAB — SURGICAL PATHOLOGY

## 2018-10-14 NOTE — Telephone Encounter (Signed)
Copied from [place] [id number]. Topic: Appointment Scheduling - Scheduling Inquiry for Clinic >> Oct 11, 2018 12:46 PM Bea Graff, NT wrote: Reason for CRM: Beth with [facility] calling to schedule a Hospital Follow-up for this pt with Dr. Sanda Klein within 4 days. No available appts with Dr. Sanda Klein next week. Please call pt to schedule.

## 2018-10-14 NOTE — Telephone Encounter (Signed)
Left msg with patient to call back for TCM call. Pt to call [phone] or [phone]

## 2018-10-14 NOTE — Progress Notes (Signed)
The patient stats she has been having bleeding (bright red)

## 2018-10-14 NOTE — Progress Notes (Signed)
[facility] day:  10/14/2018   Chief Complaint: Joanna Hall is a 52 y.o. female with stage IV cervical cancer who is seen for hospital follow up visit on day 14 following cycle #5 carboplatin + paclitaxel.   HPI:  The patient was last seen in the medical oncology clinic on 10/08/2018.  At that time, patient complained of increasing episodes of passing BRBPR. She continued to experience nausea, vomiting, diarrhea, and abdominal pain. (+) vertiginous changes noted with position changes. Hemoglobin has dropped from 13.0 g/dL on 10/04/2018 to 8.9 g/dL. She was sent to the ED for further evaluation and admission.   Patient was seen in the ED on 10/08/2018 by Dr. Rudene Re. Notes reviewed. She received 2 units of PRBCs, and was admitted to the hospital for further evaluation and treatment.   She was admitted to the surgical floor at [facility] from 12/24-2019 - 10/11/2018. Notes from hospital course reviewed. Platelets had decreased to 81,000; chronic anticoagulation (Xarelto) was held. Repeat stool studies were negative for diarrhea of infectious etiology. She was seen in consult by Dr. Lucilla Lame (gastroenterology) on 10/09/2018. The decision was made to have patient undergo colonoscopy in assessment of her acute on chronic GI blood loss. Colonoscopy on 10/10/2018, done by Dr. Jonathon Bellows, revealed 3 ulcers in the terminal ileum. Pathology results revealed no evidence of dysplasia or malignancy. Throughout the course of her admission, platelet count was monitored (dropped as low as 61,000). Labs prior to discharge revealed a WBC of 4300 and platelet count of 69,000. Patient was discharged home on 10/11/2018 with instructions to follow up with oncology as an outpatient.   In the interim, patient continues to feel poorly overall. She complains of chronic weakness and fatigue. Patient has persistent nausea, without vomiting, despite prescribed antiemetics.  Diarrhea also continues despite multiple interventions. She denies any obvious recurrent GI blood blood, however she now advises that she began to have vaginal bleeding yesterday. Patient unable to quantify the amount of bleeding, however does note that it is "more than spotting" and that she is able to "see it in the toilet". She complains of pain in RIGHT lower aspect of her pelvis, in addition to continued dysuria. Of note urine culture was negative on 10/07/2018.  Patient denies that she has experienced any B symptoms. She denies any interval infections. Patient advises that she maintains an adequate appetite. She is eating well. Weight today is 177 lb 8 oz (80.5 kg), which compared to her last visit to the clinic, represents a 7 pound increase.     Patient denies pain in the clinic today.   Past Medical History:  Diagnosis Date  . Abnormal cervical Papanicolaou smear 09/18/2017  . Anxiety   . Aortic atherosclerosis ([place])   . Arthritis    neck and knees  . 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 Blood transfusion without reported diagnosis   . Cervical cancer (HCC)    mets lung  . Chronic anal fissure   . Chronic diarrhea   . Dyspnea   . Erosive gastropathy 09/18/2017  . Factor V Leiden mutation ([place])   . Fecal incontinence   . Genital warts   . GERD (gastroesophageal reflux disease)   . Heart murmur   . Hemorrhoids   . 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 Hepatitis, chronic ([place]) 05/05/2017  . History of cancer  chemotherapy    completed 06/2017  . History of Clostridium difficile infection    while undergoing chemo.  Negative test 10/2017  . Ileocolic anastomotic leak   . 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 Intestinal infection due to Clostridium difficile 09/18/2017  . Macrocytic anemia with vitamin B12 deficiency   . Nausea vomiting and diarrhea   . Pancolitis ([place]) 07/27/2018  . Perianal condylomata   .  Pneumonia    History of  . Pulmonary nodules   . Rectal bleeding   . Small bowel obstruction ([place]) 08/2017  . Stiff neck    limited right turn  . Vitamin D deficiency     Past Surgical History:  Procedure Laterality Date  . CHOLECYSTECTOMY    . COLON SURGERY  08/2017   resection  . COLONOSCOPY WITH PROPOFOL N/A 12/20/2017   Procedure: COLONOSCOPY WITH PROPOFOL;  Surgeon: [name], MD;  Location: [facility] ENDOSCOPY;  Service: Gastroenterology;  Laterality: N/A;  . COLONOSCOPY WITH PROPOFOL N/A 07/30/2018   Procedure: COLONOSCOPY WITH PROPOFOL;  Surgeon: [name], MD;  Location: [facility] ENDOSCOPY;  Service: Gastroenterology;  Laterality: N/A;  . COLONOSCOPY WITH PROPOFOL N/A 10/10/2018   Procedure: COLONOSCOPY WITH PROPOFOL;  Surgeon: Lucilla Lame, MD;  Location: [facility] ENDOSCOPY;  Service: Endoscopy;  Laterality: N/A;  . DIAGNOSTIC LAPAROSCOPY    . ESOPHAGOGASTRODUODENOSCOPY (EGD) WITH PROPOFOL N/A 12/20/2017   Procedure: ESOPHAGOGASTRODUODENOSCOPY (EGD) WITH PROPOFOL;  Surgeon: [name], MD;  Location: [facility];  Service: Gastroenterology;  Laterality: N/A;  . ESOPHAGOGASTRODUODENOSCOPY (EGD) WITH PROPOFOL  07/30/2018   Procedure: ESOPHAGOGASTRODUODENOSCOPY (EGD) WITH PROPOFOL;  Surgeon: [name], MD;  Location: [facility] ENDOSCOPY;  Service: Gastroenterology;;  . LAPAROTOMY N/A 08/31/2017   Procedure: EXPLORATORY LAPAROTOMY for SBO, ileocolectomy, removal of piece of uterine wall;  Surgeon: [name], MD;  Location: [facility] ORS;  Service: General;  Laterality: N/A;  . LASER ABLATION CONDOLAMATA N/A 02/22/2018   Procedure: LASER ABLATION/REMOVAL OF BTDHRCBULAG AROUND ANUS AND VAGINA;  Surgeon: Michael Boston, MD;  Location: [facility];  Service: General;  Laterality: N/A;  . OOPHORECTOMY    . PORTA CATH INSERTION N/A 05/13/2018   Procedure: PORTA CATH INSERTION;  Surgeon: Katha Cabal, MD;  Location: [facility];  Service:  Cardiovascular;  Laterality: N/A;  . SMALL INTESTINE SURGERY    . TANDEM RING INSERTION     x3  . THORACOTOMY    . TOTAL KNEE ARTHROPLASTY Left 04/24/2018   Procedure: TOTAL KNEE ARTHROPLASTY;  Surgeon: Lovell Sheehan, MD;  Location: [facility] ORS;  Service: Orthopedics;  Laterality: Left;    Family History  Problem Relation Age of Onset  . Hypertension Father   . Diabetes Father   . Alcohol abuse Daughter   . Hypertension Maternal Grandmother   . Diabetes Maternal Grandmother   . Diabetes Paternal Grandmother   . Hypertension Paternal Grandmother     Social History:  reports that she quit smoking about 12 years ago. She has never used smokeless tobacco. She reports current alcohol use. She reports current drug use. Drug: Marijuana.  The patient's 72 year old daughter died of alcoholism.  The patient is originally from [place].  She moved from [place] to [place]. She is alone today.   Allergies:  Allergies  Allergen Reactions  . Ketamine Anxiety and Other (See Comments)    Syncope episode/confusion     Current Medications: Current Outpatient Medications  Medication Sig Dispense Refill  .  budesonide (ENTOCORT EC) 3 MG 24 hr capsule Take 3 capsules (9 mg total) by mouth daily. 30 capsule 0  . Calcium Carb-Cholecalciferol (CALCIUM 500 +D) 500-400 MG-UNIT TABS Take 2 tablets by mouth daily.    . diphenoxylate-atropine (LOMOTIL) 2.5-0.025 MG tablet Take 2 tablets by mouth 4 (four) times daily. 30 tablet 0  . HYDROcodone-acetaminophen (NORCO/VICODIN) 5-325 MG tablet Take 1 tablet by mouth every 6 (six) hours as needed for moderate pain. 60 tablet 0  . hydrocortisone (CORTEF) 10 MG tablet Take 10 mg by mouth 3 (three) times daily.    . Multiple Vitamins-Minerals (MULTIVITAMIN WITH MINERALS) tablet Take 1 tablet by mouth daily. 30 tablet 0  . OLANZapine zydis (ZYPREXA) 5 MG disintegrating tablet Take 1 tablet (5 mg total) by mouth at bedtime. 30 tablet 0  . ondansetron  (ZOFRAN) 8 MG tablet Take 1 tablet (8 mg total) by mouth every 8 (eight) hours as needed for nausea or vomiting. 30 tablet 3  . pantoprazole (PROTONIX) 40 MG tablet Take 1 tablet (40 mg total) by mouth daily. 30 tablet 0  . phenazopyridine (PYRIDIUM) 200 MG tablet Take 1 tablet (200 mg total) by mouth 3 (three) times daily as needed for pain. 10 tablet 0  . promethazine (PHENERGAN) 25 MG tablet Take 1 tablet (25 mg total) by mouth every 6 (six) hours as needed for nausea or vomiting. 30 tablet 0  . VIBERZI 100 MG TABS TAKE 1 TABLET BY MOUTH TWICE DAILY WITH  A  MEAL 60 tablet 0  . amitriptyline (ELAVIL) 50 MG tablet Take 1 tablet (50 mg total) by mouth at bedtime. 30 tablet 2  . cyanocobalamin (,VITAMIN B-12,) 1000 MCG/ML injection Inject 1ML every week x 4 weeks, then 1ML every other week x 2 months, then monthly x 3 months (Patient not taking: Reported on 10/08/2018) 10 mL 1  . Syringe/Needle, Disp, (SYRINGE 3CC/22GX1") 22G X 1" 3 ML MISC For use with Vitamin B12 injections (Patient not taking: Reported on 10/14/2018) 50 each 0  . traZODone (DESYREL) 50 MG tablet Take 0.5-1 tablets (25-50 mg total) by mouth at bedtime as needed for sleep. (Patient not taking: Reported on 10/08/2018) 30 tablet 0  . VENTOLIN HFA 108 (90 Base) MCG/ACT inhaler Inhale 1-2 puffs into the lungs every 4 (four) hours as needed for shortness of breath.   0   No current facility-administered medications for this visit.    Facility-Administered Medications Ordered in Other Visits  Medication Dose Route Frequency Provider Last Rate Last Dose  . heparin lock flush 100 unit/mL  500 Units Intravenous Once Corcoran, Melissa C, MD      . sodium chloride 0.9 % 1,000 mL with potassium chloride 20 mEq infusion   Intravenous Once Karen Kitchens, NP         Review of Systems  Constitutional: Positive for malaise/fatigue. Negative for diaphoresis, fever and weight loss (up 7 pounds).  HENT: Negative.   Eyes: Negative.   Respiratory:  Positive for shortness of breath (exertional). Negative for cough, hemoptysis and sputum production.   Cardiovascular: Negative for chest pain, palpitations, orthopnea, leg swelling and PND.  Gastrointestinal: Positive for abdominal pain (RLQ), diarrhea and nausea. Negative for blood in stool (none appreciated), constipation, melena and vomiting.  Genitourinary: Positive for dysuria. Negative for frequency, hematuria and urgency.       (+) vaginal bleeding  Musculoskeletal: Negative for back pain, falls, joint pain and myalgias.  Skin: Negative for itching and rash.  Neurological: Positive for weakness (  generalized). Negative for dizziness (resolved), tremors and headaches.  Endo/Heme/Allergies: Does not bruise/bleed easily.  Psychiatric/Behavioral: Negative for depression, memory loss and suicidal ideas. The patient is not nervous/anxious and does not have insomnia.   All other systems reviewed and are negative.  Performance status (ECOG): 2 - Symptomatic, <50% confined to bed  Vital Signs BP 126/88 (BP Location: Left Arm, Patient Position: Sitting)   Pulse 96   Temp (!) 97.1 F (36.2 C) (Tympanic)   Resp 18   Ht 5' 8"  (1.727 m)   Wt 177 lb 8 oz (80.5 kg)   SpO2 97%   BMI 26.99 kg/m   Physical Exam  Constitutional: She is oriented to person, place, and time and well-developed, well-nourished, and in no distress. Vital signs are normal.  Non-toxic appearance. She has a sickly appearance (chronically ill appearing). No distress.  HENT:  Head: Normocephalic and atraumatic. Hair is abnormal (chemotherapy induced alopecia).  Mouth/Throat: Oropharynx is clear and moist and mucous membranes are normal.  Eyes: Pupils are equal, round, and reactive to light. EOM are normal. No scleral icterus.  Neck: Normal range of motion. Neck supple. No tracheal deviation present. No thyromegaly present.  Cardiovascular: Normal rate, regular rhythm, normal heart sounds and intact distal pulses. Exam  reveals no gallop and no friction rub.  No murmur heard. Pulmonary/Chest: Effort normal and breath sounds normal. No respiratory distress. She has no wheezes. She has no rales.  PAC; accessed; no signs of infection  Abdominal: Soft. Bowel sounds are normal. She exhibits no distension. There is abdominal tenderness (slight) in the right lower quadrant and suprapubic area.  Musculoskeletal: Normal range of motion.        General: No tenderness or edema.  Lymphadenopathy:    She has no cervical adenopathy.    She has no axillary adenopathy.       Right: No inguinal and no supraclavicular adenopathy present.       Left: No inguinal and no supraclavicular adenopathy present.  Neurological: She is alert and oriented to person, place, and time.  Skin: Skin is warm and dry. No rash noted. No erythema.  Psychiatric: Mood, affect and judgment normal.  Nursing note and vitals reviewed.   Imaging studies: 06/2017:  PET scan revealed enlarging pulmonary nodules but no evidence of abdominal disease per patient report. 09/13/2017:  Abdomen and pelvic CT revealed a small infarct in the inferior pole of the LEFT kidney and RIGHT uterine infarct. 11/08/2017:  PET scan at the [facility] revealed innumerable stable bilateral cavitary pulmonary nodules (up to 8 mm), some of which demonstrate low level metabolic activity.  There were post treatment changes in the pelvis without evidence of suspicious hypermetabolic activity. 12/03/2017:  Abdomen and pelvic CT revealed continued multiple pulmonary nodules in the lung bases concerning for metastatic disease.  There was mild wall thickening of rectosigmoid colon is noted.  There was a stable 1.8 x 1.6 cm mixed fat and soft tissue density noted in right lower quadrant which may represent fat necrosis.  There was a larger low density is noted involving uterine fundus.  12/03/2017:  Pelvic MRI was unremarkable.   04/15/2018:  Chest, abdomen,  and pelvis CT revealed innumerable (> 100) cavitary nodules scattered in the lungs, moderately enlarging compared to the 11/08/2017 PET-CT, suspicious for metastatic disease.  One index node in the RLL measures 1.0 x 1.1 cm (previously 0.6 x 0.6 cm).  There were no new nodules.  There was an ill-defined wall  thickening in the rectosigmoid with surrounding stranding along fascia planes, probably sequela from prior radiation therapy.  There was multilevel lumbar impingement due to spondylosis and degenerative disc disease.  There was heterogeneous enhancement in the uterus (some possibly from prior radiation therapy). 04/23/2018:  PET scan revealed numerous scattered solid and cavitary nodules in the lungs stable increased in size compared to the prior PET-CT from 11/08/2017. Largest nodule was 1.1 cm in the LUL (SUV 1.9).  These demonstrated low-grade metabolic activity up to a maximum SUV of about 2.3, increased from 11/08/2017.  05/23/2018:  Abdomen and pelvic CT revealed colonic wall thickening, consistent with colitis. Considerations include inflammatory or infectious causes. Ischemic causes would be less likely. She is s/p RIGHT hemicolectomy. She had a patent ileocolic anastomosis.  She received a 10 day course of ciprofloxacin and Flagyl.   09/07/2018:  Abdomen and pelvic CT angiogram revealed colitis, mainly affecting lower loops, question radiation colitis if there has been radiotherapy of the patient's cervical cancer.  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  09/30/2018:  Chest CT revealed interval decrease in size and number of metastatic pulmonary nodules.  There were no new/progressive findings in the chest or upper abdomen   Appointment on 10/14/2018  Component Date Value Ref Range Status  . Magnesium 10/14/2018 1.3* 1.7 - 2.4 mg/dL Final   Performed at [facility], [address] [place], [place] [address]  . Sodium 10/14/2018 141  135 - 145 mmol/L Final  . Potassium 10/14/2018 3.4*  3.5 - 5.1 mmol/L Final  . Chloride 10/14/2018 102  98 - 111 mmol/L Final  . CO2 10/14/2018 30  22 - 32 mmol/L Final  . Glucose, Bld 10/14/2018 110* 70 - 99 mg/dL Final  . BUN 10/14/2018 15  6 - 20 mg/dL Final  . Creatinine, Ser 10/14/2018 0.67  0.44 - 1.00 mg/dL Final  . Calcium 10/14/2018 9.1  8.9 - 10.3 mg/dL Final  . GFR calc non Af Amer 10/14/2018 >60  >60 mL/min Final  . GFR calc Af Amer 10/14/2018 >60  >60 mL/min Final  . Anion gap 10/14/2018 9  5 - 15 Final   Performed at [facility], [address], [place], [place] [address]  . WBC 10/14/2018 4.5  4.0 - 10.5 K/uL Final  . RBC 10/14/2018 3.84* 3.87 - 5.11 MIL/uL Final  . Hemoglobin 10/14/2018 12.7  12.0 - 15.0 g/dL Final  . HCT 10/14/2018 38.3  36.0 - 46.0 % Final  . MCV 10/14/2018 99.7  80.0 - 100.0 fL Final  . MCH 10/14/2018 33.1  26.0 - 34.0 pg Final  . MCHC 10/14/2018 33.2  30.0 - 36.0 g/dL Final  . RDW 10/14/2018 15.0  11.5 - 15.5 % Final  . Platelets 10/14/2018 74* 150 - 400 K/uL Final  . nRBC 10/14/2018 0.0  0.0 - 0.2 % Final  . Neutrophils Relative % 10/14/2018 79  % Final  . Neutro Abs 10/14/2018 3.5  1.7 - 7.7 K/uL Final  . Lymphocytes Relative 10/14/2018 14  % Final  . Lymphs Abs 10/14/2018 0.6* 0.7 - 4.0 K/uL Final  . Monocytes Relative 10/14/2018 5  % Final  . Monocytes Absolute 10/14/2018 0.2  0.1 - 1.0 K/uL Final  . Eosinophils Relative 10/14/2018 1  % Final  . Eosinophils Absolute 10/14/2018 0.1  0.0 - 0.5 K/uL Final  . Basophils Relative 10/14/2018 0  % Final  . Basophils Absolute 10/14/2018 0.0  0.0 - 0.1 K/uL Final  . Immature Granulocytes 10/14/2018 1  %  Final  . Abs Immature Granulocytes 10/14/2018 0.04  0.00 - 0.07 K/uL Final   Performed at [facility], [address] [place], [place] [address]    Assessment:  DEREONA KOLODNY is a 52 y.o. female with a history of stage IB1 cervical cancer s/p concurrent cisplatin and radiation (external beam and brachytherapy) from 11/2016 -  02/2017.    She was treated by Dr. Christene Slates at [facility] in [place], [place].  In 01/2017, she received cisplatin x 2 and carboplatin x 1 (01/29/2017) due to ARF and XRT. Radiation was followed by T & O on 02/01/2017 and T & N on 02/10/2017 and 02/20/2017.  Course was complicated by weight loss (80 pounds), nausea, vomiting, electrolyte wasting (potassium and magnesium), and multiple hospitalizations.  PET scan in 06/2017 revealed enlarging pulmonary nodules but no evidence of abdominal disease per patient report.  She is s/p right ileocolectomy on 08/31/2017 for a small bowel obstruction.  She presented with abdominal pain.  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 PDL-1 testing reveals a CPS score of 15.  Chest, abdomen, and pelvis CT on 04/15/2018 revealed innumerable (> 100) cavitary nodules scattered in the lungs, moderately enlarging compared to the 11/08/2017 PET-CT, suspicious for metastatic disease. One index node in the RLL measures 1.0 x 1.1 cm.  There were no new nodules.  There was an ill-defined wall thickening in the rectosigmoid with surrounding stranding along fascia planes, probably sequela from prior radiation therapy.  There was multilevel lumbar impingement due to spondylosis and degenerative disc disease.  There was heterogeneous enhancement in the uterus (some possibly from prior radiation therapy).  PET scan on 04/23/2018 revealed numerous scattered solid and cavitary nodules in the lungs stable increased in size compared to the prior PET-CT from 11/08/2017. Largest nodule was 1.1 cm in the LUL (SUV 1.9).  These demonstrated low-grade metabolic activity up to a maximum SUV of about 2.3, increased from 11/08/2017.   CT guided biopsy of a left upper lobe pulmonary nodule on 05/06/2018 confirmed metastatic  adenocarcinoma morphologically c/w cervical adenocarcinoma.  She day 9 s/p cycle # 5 cycle of carboplatin and Taxol (06/04/2018 - 09/30/2018) with Margarette [place] support.    Chest CT on 09/30/2018 revealed interval decrease in size and number of metastatic pulmonary nodules.  There were no new/progressive findings in the chest or upper abdomen  Hypercoagulable work-up on 09/14/2017 revealed heterozygosity for Factor V Leiden (single [id number] mutation).  Testing was negative for prothrombin gene mutation, lupus anticoagulant panel, anticardiolipin antibodies, and beta-2 glycoprotein antibodies.  She was discharged on Xarelto.  She was admitted to [facility] from 12/03/2017 - 12/08/2017 with abdominal pain. She was not felt to have a uterine infarct.  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  The etiology was unclear.  She was admitted to [facility] from 12/24-2019 - 10/11/2018 for rectal bleeding. Hemoglobin dropped from 13 to 8.9 in 4 days. Platelets had decreased to 81,000; chronic anticoagulation (Xarelto) was held. She received 2 units of PRBCs. Repeat stool studies were negative for diarrhea of infectious etiology. Colonoscopy on 10/10/2018 revealed 3 ulcers in the terminal ileum (no evidence of dysplasia or malignancy). Throughout the course of her admission, platelet count was monitored (dropped as low as 61,000).   She is hepatitis C positive.   Hepatitis C genotype is 2a/2c.  She has B12  deficiency.  B12 was 165 on 11/27/2017.  She began B12 injections on 11/29/2017.  B12 injections occur monthly at home.  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  She was diagnosed with colitis.  Abdomen and pelvic CT on 05/23/2018 revealed colonic wall thickening, consistent with colitis. Considerations include inflammatory or infectious causes. Ischemic causes would be less likely. She is s/p RIGHT hemicolectomy. She had a patent ileocolic anastomosis.  She received a 10 day course of ciprofloxacin  and Flagyl.  Abdomen and pelvic CT angiogram on 09/07/2018 revealed colitis, mainly affecting lower loops, question radiation colitis if there has been radiotherapy of the patient's cervical cancer.  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   She has  chronic diarrhea s/p right hemicolectomy.  She is on colestipol, budesonide, amitriptyline, and Viberzi.  She has adrenal insufficiency.  She is on hydrocortisone.  Symptomatically, patient continues to feel poorly overall. Patient with continues nausea and diarrhea. GI bleeding has resolved, however she is now having acute vaginal bleeding. She describes pain in the RIGHT lower aspect of her abdomen. (+) dysuria persists; recent culture negative. Exam reveals slight tenderness to RIGHT lower abdomen and suprapubic area. No CVA tenderness on exam. WBC 4500 (ANC 3500, ALC 600). Platelets 74,000. Potassium 3.4 mmol/L. Magnesium 1.3 mg/dL.    Plan: 1. Labs today: CBC with diff, BMP, Mg 2. Review interval admission 3. Metastatic cervical cancer  Day 14 s/p last cycle of carboplatin + paclitaxel with Neulasta support.  Acute vaginal bleeding started last night. Unsure if related.   Nausea persists, but controlled with prescribed interventions.   Cycle #6 chemotherapy scheduled for 10/21/2018 if clinical condition allows.  4. Anemia and thrombocytopenia  Labs reviewed.  Anemia resolved followed 2 units of PRBCs while inpatient.   Hemoglobin 12.7 and hematocrit 38.3.  Platelets remain low at 74,000.  Rivaroxaban to remain on hold given platelet count and active bleeding.  5. Vaginal bleeding  Acute symptom.   Unable to quantify amount of bleeding, however notes that it is "more than spotting".   Etiology unknown - ?? related to underlying malignancy.  Spoke with Zenia Resides, NP (GYN oncology). Case discussed.    Patient to be seen in clinic on 10/15/18 for further evaluation (pelvic exam).  6. RIGHT lower quadrant abdominal pain  Persistent  in RLQ and suprapubic area.   She is questioning UTI, however urine (-) on 10/07/2018 - no dysuria.   No rebound tenderness. 7. Rectal bleeding  Review recent colonoscopy.   3 ulcers noted at the terminal ileum.   Pathology (-) for high grade dysplasia or malignancy.   Bleeding has resolved at this point.   Continue to follow up with gastroenterology as already scheduled.  8. Nausea and diarrhea  Nausea fairly well controlled with prescribed interventions.  No weight loss; able to maintain adequate nutritional intake.    Remains on multi-drug management (colestipol, budesonide, amitriptyline, Viberzi) for diarrhea, which is only intermittently effective.   Followed by Dr. Marius Ditch (GI)  5HIAA and celiac serologies normal.   Morning cortisol level low at 2.7 mcg/dL  Started on hydrocortisone 5 mg TID on 08/28/2018, with plans to taper off of Viberzi.   Referred to endocrinology for further evaluation of possible adrenal insufficiency.   Advised to continue all interventions as prescribed by gastroenterology.   Encouraged to increase fluid intake using ORS (electrolyte enriched fluids) to prevent dehydration and electrolyte issues.  9. Electrolyte derangements  Labs reviewed. BUN 15 and creatinine 0.67 mg/dL.   Potassium  level low at 3.4 mmol/L.   Magnesium level low at 1.3 mg/dL.  No chair space available in the infusion center today for electrolyte replacement.   Patient being seen by GYN/onc tomorrow (10/15/18) for evaluation.   Will place orders for: 1L 0.9% NS with 20 mEq KCl and 6 grams Mg to be given while she is here.  10. RTC on 10/18/2018 for labs (BMP, Mg) and +/- IVFs. 11. RTC on 10/21/2018 for MD assessment, labs (CBC with diff, CMP, Mg), and cycle #6 carboplatin + paclitaxel with Neulasta support.   Honor Loh, NP  10/14/2018, 1:14 PM

## 2018-10-14 NOTE — Telephone Encounter (Signed)
Called patient to schedule hospital follow up, patient did not answer, left detailed VM. CRM created.

## 2018-10-15 ENCOUNTER — Inpatient Hospital Stay (HOSPITAL_BASED_OUTPATIENT_CLINIC_OR_DEPARTMENT_OTHER): Payer: Medicaid Other | Admitting: Nurse Practitioner

## 2018-10-15 ENCOUNTER — Other Ambulatory Visit: Payer: Self-pay

## 2018-10-15 ENCOUNTER — Other Ambulatory Visit: Payer: Self-pay | Admitting: *Deleted

## 2018-10-15 ENCOUNTER — Inpatient Hospital Stay: Payer: Medicaid Other

## 2018-10-15 VITALS — BP 137/85 | HR 82 | Temp 96.9°F | Resp 20 | Wt 178.0 lb

## 2018-10-15 DIAGNOSIS — D6851 Activated protein C resistance: Secondary | ICD-10-CM

## 2018-10-15 DIAGNOSIS — R300 Dysuria: Secondary | ICD-10-CM

## 2018-10-15 DIAGNOSIS — D649 Anemia, unspecified: Secondary | ICD-10-CM

## 2018-10-15 DIAGNOSIS — C7800 Secondary malignant neoplasm of unspecified lung: Secondary | ICD-10-CM | POA: Diagnosis not present

## 2018-10-15 DIAGNOSIS — E538 Deficiency of other specified B group vitamins: Secondary | ICD-10-CM

## 2018-10-15 DIAGNOSIS — Z923 Personal history of irradiation: Secondary | ICD-10-CM

## 2018-10-15 DIAGNOSIS — E871 Hypo-osmolality and hyponatremia: Secondary | ICD-10-CM

## 2018-10-15 DIAGNOSIS — D696 Thrombocytopenia, unspecified: Secondary | ICD-10-CM

## 2018-10-15 DIAGNOSIS — G893 Neoplasm related pain (acute) (chronic): Secondary | ICD-10-CM

## 2018-10-15 DIAGNOSIS — K648 Other hemorrhoids: Secondary | ICD-10-CM

## 2018-10-15 DIAGNOSIS — E860 Dehydration: Secondary | ICD-10-CM

## 2018-10-15 DIAGNOSIS — K625 Hemorrhage of anus and rectum: Secondary | ICD-10-CM

## 2018-10-15 DIAGNOSIS — C538 Malignant neoplasm of overlapping sites of cervix uteri: Secondary | ICD-10-CM

## 2018-10-15 DIAGNOSIS — N939 Abnormal uterine and vaginal bleeding, unspecified: Secondary | ICD-10-CM

## 2018-10-15 DIAGNOSIS — Z5111 Encounter for antineoplastic chemotherapy: Secondary | ICD-10-CM | POA: Diagnosis not present

## 2018-10-15 DIAGNOSIS — E876 Hypokalemia: Secondary | ICD-10-CM

## 2018-10-15 DIAGNOSIS — Z79899 Other long term (current) drug therapy: Secondary | ICD-10-CM

## 2018-10-15 DIAGNOSIS — Z95828 Presence of other vascular implants and grafts: Secondary | ICD-10-CM

## 2018-10-15 DIAGNOSIS — R197 Diarrhea, unspecified: Secondary | ICD-10-CM

## 2018-10-15 LAB — URINALYSIS, COMPLETE (UACMP) WITH MICROSCOPIC
Bacteria, UA: NONE SEEN
Bilirubin Urine: NEGATIVE
Glucose, UA: NEGATIVE mg/dL
KETONES UR: NEGATIVE mg/dL
LEUKOCYTES UA: NEGATIVE
Nitrite: POSITIVE — AB
Protein, ur: NEGATIVE mg/dL
SQUAMOUS EPITHELIAL / LPF: NONE SEEN (ref 0–5)
Specific Gravity, Urine: 1.003 — ABNORMAL LOW (ref 1.005–1.030)
pH: 7 (ref 5.0–8.0)

## 2018-10-15 MED ORDER — HEPARIN SOD (PORK) LOCK FLUSH 100 UNIT/ML IV SOLN
500.0000 [IU] | Freq: Once | INTRAVENOUS | Status: AC
  Administered 2018-10-15: 500 [IU] via INTRAVENOUS

## 2018-10-15 MED ORDER — NITROFURANTOIN MONOHYD MACRO 100 MG PO CAPS: 100.0000 mg | ORAL_CAPSULE | Freq: Two times a day (BID) | ORAL | 0 refills | Status: DC

## 2018-10-15 MED ORDER — SODIUM CHLORIDE 0.9% FLUSH
10.0000 mL | INTRAVENOUS | Status: DC | PRN
  Administered 2018-10-15: 10 mL via INTRAVENOUS
  Filled 2018-10-15: qty 10

## 2018-10-15 MED ORDER — HYDROCODONE-ACETAMINOPHEN 5-325 MG PO TABS
1.0000 | ORAL_TABLET | Freq: Once | ORAL | Status: AC
  Administered 2018-10-15: 1 via ORAL
  Filled 2018-10-15: qty 1

## 2018-10-15 MED ORDER — SODIUM CHLORIDE 0.9 % IV SOLN
Freq: Once | INTRAVENOUS | Status: AC
  Administered 2018-10-15: 13:00:00 via INTRAVENOUS
  Filled 2018-10-15: qty 10

## 2018-10-15 NOTE — Progress Notes (Signed)
Symptom Management [place]  [phone] [phone]  Patient Care Team: Arnetha Courser, MD as PCP - General (Family Medicine) Mellody Drown, MD as Consulting Physician (Obstetrics and Gynecology) Lenor Coffin, MD as Attending Physician (Gynecology) Lequita Asal, MD as Medical Oncologist (Hematology and Oncology) [name], MD as Consulting Physician (Gastroenterology) Michael Boston, MD as Consulting Physician (General Surgery) Lovell Sheehan, MD as Consulting Physician (Orthopedic Surgery) Clent Jacks, RN as Registered Nurse   Name of the patient: Joanna Hall  [id number]  1966/03/24   Date of visit: 10/15/18  Diagnosis- stage IV cervical cancer  Chief complaint/ Reason for visit- vaginal bleeding  Heme/Onc history:  Oncology History   Patient was diagnosed with cervical adenocarcinoma in [place] in 09/2016.  She has had a long-standing history of abnormal Pap smears.  She presented with an ovarian cyst.  Laparoscopic surgery was pursued but was difficult due to scar tissue.  Had BSO and rupture of cyst with purulent drainage into the abdomen.  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  Radical hysterectomy was aborted.  She was treated by Dr. Christene Slates at [facility] in [place], [place].  Decision was made to pursue concurrent chemotherapy (weekly cisplatin) and radiation.  She received treatment from 11/2016-05/2017.  01/2017 cisplatin x 2 and carboplatin x 1 (01/29/2017) due to ARF and XRT.  XRT was followed by T & O on 02/01/2017 and T & N 02/10/2017 and 02/20/2017.  Course was complicated by 80 pound weight loss, nausea, vomiting, electrolyte wasting (potassium and magnesium).  She describes that.  Is been sick constantly requiring at least 20 hospitalizations.  Follow-up CT chest and PET on 06/2017. Per patient, 'radiation worked' and no disease in  the abdomen.  At that time she was noted to have lung nodules that were growing and follow-up imaging was scheduled for 10/2017.  She was admitted to hospital in [place] for small bowel obstruction which was managed conservatively and she was home for a week prior to traveling to [place] for Thanksgiving holiday where she has family.  She presented to ER in [place] on 08/2017 with nausea, vomiting, and lower abdominal pain.  Symptoms did not respond to conservative treatment.  CT on 08/26/2017 revealed small bowel obstruction with transition in the pelvis just superior to the uterus rather was a long segment of distal ileum with fatty wall thickening compatible with chronic inflammation and/or radiation enteritis. Imaging showed numerous pulmonary nodules consistent with metastatic disease. She underwent laparotomy and right ileocolectomy on 08/31/2017 at [facility].  Surgical findings revealed a thickened, matted, and scarred piece of distal small bowel close to the ileocecal valve.  She was discharged on 09/05/2017.  Pain markedly increased in intensity and imaging was performed on 09/11/2017 which revealed: Debris within the anterior abdominal wall incision concerning for infection versus packing material, s/p post ileo-colectomy with expected postoperative changes, mild colonic ileus, numerous pulmonary nodules highly concerning for metastatic disease, punctate nonobstructing nephrolithiasis.  Staples were removed and one was packed.  She was started on doxycycline.  Abdominal and pelvic CT without contrast on 09/11/2017 revealed debris within anterior abdominal wall incision concerning for infection, versus packing material.She is s/pileocolectomy with expected postoperative changes and mild colonic ileus. There were numerous pulmonary nodules highly concerning for metastatic diseaseand punctate nonobstructing nephrolithiasis. She was readmitted on 09/12/2017. She describes the  onset of lower abdominal pain on 09/09/2017. Pain markedly increased  in intensity on 09/11/2017.  Staples were removed and the wound packed. She was started on doxycycline.  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  PET scan was ordered to evaluate enlarging lung nodules with concern for recurrent cervical cancer but scan was delayed due to insurance and need to be performed in [place].  Presented to ER on 12/03/2017 for abdominal pain and emesis.  Imaging concerning for worsening possible uterine infarct and she was admitted to hospital.  Pelvic MRI was unremarkable.  Remote scarring type changes of uterus thought to be possibly related to radiation.  She was discharged on 12/08/2017.  Underwent endoscopy and colonoscopy on 12/20/2017.    On 02/22/2018 she underwent laser ablation of condylomata around the anus and vagina under anesthesia with Dr. Johney Maine.   04/15/2018: Chest, abdomen, and pelvis CTrevealed innumerable (>100) cavitary nodules scattered in the lungs, moderately enlarging compared to the 11/08/2017 PET-CT, suspicious for metastatic disease. One index node in the RLL measures 1.0 x 1.1 cm (previously 0.6 x 0.6 cm). There were no new nodules. There was an ill-defined wall thickening in the rectosigmoid with surrounding stranding along fascia planes, probably sequela from prior radiation therapy. There was multilevel lumbar impingement due to spondylosis and degenerative disc disease. There was heterogeneous enhancement in the uterus (some possibly from prior radiation therapy).  04/23/2018:PET  scan revealednumerous scattered solid and cavitary nodules in the lungs stable increased in size compared to the prior PET-CT from 11/08/2017.Largest nodule was 1.1 cm in the LUL (SUV 1.9).These demonstratedlow-grade metabolic activity up to a maximum SUV of about 2.3, increased from 11/08/2017.   Case was discussed at tumor board on 04/25/2018. Consensus to pursue CT-guided biopsy (05/06/18) which revealed: Metastatic adenocarcinoma, morphologically consistent with cervical adenocarcinoma.  She has history of chronic hepatitis C which is managed by GI.  Hepatitis C genotype is 2a/2c.  She receives B12 injections for history of B12 deficiency.  On 04/24/2018 she underwent left total knee replacement with Dr. [name].     Malignant neoplasm of overlapping sites of cervix ([place])   09/18/2017 Initial Diagnosis    Malignant neoplasm of overlapping sites of cervix ([place])    06/04/2018 -  Chemotherapy    The patient had palonosetron (ALOXI) injection 0.25 mg, 0.25 mg, Intravenous,  Once, 5 of 10 cycles Administration: 0.25 mg (06/04/2018), 0.25 mg (06/25/2018), 0.25 mg (09/30/2018), 0.25 mg (07/23/2018), 0.25 mg (09/03/2018) pegfilgrastim (NEULASTA) injection 6 mg, 6 mg, Subcutaneous, Once, 5 of 10 cycles Administration: 6 mg (06/06/2018), 6 mg (06/26/2018), 6 mg (07/24/2018), 6 mg (09/04/2018), 6 mg (10/01/2018) CARBOplatin (PARAPLATIN) 500 mg in sodium chloride 0.9 % 250 mL chemo infusion, 500 mg (100 % of original dose 497.2 mg), Intravenous,  Once, 5 of 10 cycles Dose modification:   (original dose 497.2 mg, Cycle 1, Reason: Provider Judgment, Comment: difficulty with counts with initial chemo in [place]; advance dose as tolerated),   (original dose 497.2 mg, Cycle 5, Reason: Provider Judgment, Comment: return back to original dose) Administration: 500 mg (06/04/2018), 560 mg (06/25/2018), 500 mg (09/30/2018), 500 mg (07/23/2018) PACLitaxel (TAXOL) 264 mg in sodium chloride 0.9 % 250 mL chemo infusion  (>  58m/m2), 140 mg/m2 = 264 mg (100 % of original dose 140 mg/m2), Intravenous,  Once, 5 of 10 cycles Dose modification: 140 mg/m2 (original dose 140 mg/m2, Cycle 1, Reason: Provider Judgment, Comment: difficulty with counts [place] advance dose as tolerated), 155 mg/m2 (original dose 140 mg/m2, Cycle 2, Reason: Provider Judgment, Comment: advance as tolerated), 155 mg/m2 (original dose 175 mg/m2, Cycle 5, Reason: Provider Judgment, Comment: continue current dose) Administration: 264 mg (06/04/2018), 294 mg (06/25/2018), 294 mg (09/30/2018), 294 mg (07/23/2018), 294 mg (09/03/2018)  for chemotherapy treatment.      Interval history-patient presents to symptom management clinic complaining of vaginal spotting and bleeding started approximately 3 days ago.  It has occurred intermittently.  States not enough to saturate a pad but she did notice blood in the toilet.  Today, denies rectal bleeding.  Complains of dysuria and pain.  Also reports right lower quadrant abdominal pain which is unrelieved by hydrocodone 5-325.  She rates her pain 7 of 10 which is new.  Recently she was discharged from hospital on 10/11/2018 for GI bleed, ARF with hypoxia, and hematochezia.  She was seen by GI.  Colonoscopy revealed 3 bleeding terminal ileal ulcers no anastomotic ulcer.  She continues to be followed by GI.  Xarelto is currently held. She is currently status post cycle 5 carboplatin-Taxol with Neulasta support.  CT chest on 09/30/2018 revealed decrease in size of pulmonary nodules. She reports appetite is good and denies weight loss.  Denies chest pain.  Denies nausea or vomiting.   ECOG FS:1 - Symptomatic but completely ambulatory  Review of systems- Review of Systems  Constitutional: Negative for chills, fever, malaise/fatigue and weight loss.  HENT: Negative for congestion, ear discharge, ear pain, sinus pain, sore throat and tinnitus.   Eyes: Negative.   Respiratory: Negative.   Negative for cough, sputum production and shortness of breath.   Cardiovascular: Negative for chest pain, palpitations, orthopnea, claudication and leg swelling.  Gastrointestinal: Positive for abdominal pain and diarrhea. Negative for blood in stool, constipation, heartburn, nausea and vomiting.  Genitourinary: Positive for dysuria, hematuria (unsure) and urgency.  Musculoskeletal: Negative.   Skin: Negative.   Neurological: Negative for dizziness, tingling, weakness and headaches.  Endo/Heme/Allergies: Negative.   Psychiatric/Behavioral: Negative.     Current treatment- s/p cycle 5 carbo-taxol 09/30/18  Allergies  Allergen Reactions  . Ketamine Anxiety and Other (See Comments)    Syncope episode/confusion     Past Medical History:  Diagnosis Date  . Abnormal cervical Papanicolaou smear 09/18/2017  . Anxiety   . Aortic atherosclerosis ([place]   . Arthritis    neck and knees  . 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 Blood transfusion without reported diagnosis   . Cervical cancer (HCC)    mets lung  . Chronic anal fissure   . Chronic diarrhea   . Dyspnea   . Erosive gastropathy 09/18/2017  . Factor V Leiden mutation ([place]   . Fecal incontinence   . Genital warts   . GERD (gastroesophageal reflux disease)   . Heart murmur   . Hemorrhoids   . 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 Hepatitis, chronic ([place] 05/05/2017  . History of cancer chemotherapy    completed 06/2017  . History of Clostridium difficile infection    while undergoing chemo.  Negative test 10/2017  . Ileocolic anastomotic leak   . 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 Intestinal infection due to Clostridium difficile 09/18/2017  . Macrocytic anemia with vitamin B12 deficiency   . Nausea vomiting and diarrhea   . Pancolitis ([place]) 07/27/2018  . Perianal condylomata   . Pneumonia    History of  . Pulmonary nodules   . Rectal bleeding   . Small  bowel obstruction ([place]) 08/2017  . Stiff neck    limited right turn  . Vitamin D deficiency     Past Surgical History:  Procedure Laterality Date  . CHOLECYSTECTOMY    . COLON SURGERY  08/2017   resection  . COLONOSCOPY WITH PROPOFOL N/A 12/20/2017   Procedure: COLONOSCOPY WITH PROPOFOL;  Surgeon: [name], MD;  Location: [facility] ENDOSCOPY;  Service: Gastroenterology;  Laterality: N/A;  . COLONOSCOPY WITH PROPOFOL N/A 07/30/2018   Procedure: COLONOSCOPY WITH PROPOFOL;  Surgeon: [name], MD;  Location: [facility] ENDOSCOPY;  Service: Gastroenterology;  Laterality: N/A;  . COLONOSCOPY WITH PROPOFOL N/A 10/10/2018   Procedure: COLONOSCOPY WITH PROPOFOL;  Surgeon: Lucilla Lame, MD;  Location: [facility] ENDOSCOPY;  Service: Endoscopy;  Laterality: N/A;  . DIAGNOSTIC LAPAROSCOPY    . ESOPHAGOGASTRODUODENOSCOPY (EGD) WITH PROPOFOL N/A 12/20/2017   Procedure: ESOPHAGOGASTRODUODENOSCOPY (EGD) WITH PROPOFOL;  Surgeon: [name], MD;  Location: [place];  Service: Gastroenterology;  Laterality: N/A;  . ESOPHAGOGASTRODUODENOSCOPY (EGD) WITH PROPOFOL  07/30/2018   Procedure: ESOPHAGOGASTRODUODENOSCOPY (EGD) WITH PROPOFOL;  Surgeon: [name], MD;  Location: [facility] ENDOSCOPY;  Service: Gastroenterology;;  . LAPAROTOMY N/A 08/31/2017   Procedure: EXPLORATORY LAPAROTOMY for SBO, ileocolectomy, removal of piece of uterine wall;  Surgeon: [name], MD;  Location: [facility] ORS;  Service: General;  Laterality: N/A;  . LASER ABLATION CONDOLAMATA N/A 02/22/2018   Procedure: LASER ABLATION/REMOVAL OF [id number] AROUND ANUS AND VAGINA;  Surgeon: Michael Boston, MD;  Location: [facility];  Service: General;  Laterality: N/A;  . OOPHORECTOMY    . PORTA CATH INSERTION N/A 05/13/2018   Procedure: PORTA CATH INSERTION;  Surgeon: Katha Cabal, MD;  Location: [place] CV LAB;  Service: Cardiovascular;  Laterality: N/A;  . SMALL INTESTINE SURGERY    . TANDEM RING  INSERTION     x3  . THORACOTOMY    . TOTAL KNEE ARTHROPLASTY Left 04/24/2018   Procedure: TOTAL KNEE ARTHROPLASTY;  Surgeon: Lovell Sheehan, MD;  Location: [facility] ORS;  Service: Orthopedics;  Laterality: Left;    Social History   Socioeconomic History  . Marital status: Divorced    Spouse name: Not on file  . Number of children: Not on file  . Years of education: Not on file  . Highest education level: Not on file  Occupational History  . Not on file  Social Needs  . Financial resource strain: Not hard at all  . Food insecurity:    Worry: Never true    Inability: Never true  . Transportation needs:    Medical: No    Non-medical: No  Tobacco Use  . Smoking status: Former Smoker    Last attempt to quit: 10/16/2006    Years since quitting: 12.0  . Smokeless tobacco: Never Used  Substance and Sexual Activity  . Alcohol use: Yes    Frequency: Never    Comment: seldom  . Drug use: Yes    Types: Marijuana  . Sexual activity: Not Currently    Birth control/protection: Post-menopausal    Comment: Not Asked  Lifestyle  . Physical activity:  Days per week: Patient refused    Minutes per session: Patient refused  . Stress: Only a little  Relationships  . Social connections:    Talks on phone: Patient refused    Gets together: Patient refused    Attends religious service: Patient refused    Active member of club or organization: Patient refused    Attends meetings of clubs or organizations: Patient refused    Relationship status: Patient refused  . Intimate partner violence:    Fear of current or ex partner: No    Emotionally abused: No    Physically abused: No    Forced sexual activity: No  Other Topics Concern  . Not on file  Social History Narrative  . Not on file    Family History  Problem Relation Age of Onset  . Hypertension Father   . Diabetes Father   . Alcohol abuse Daughter   . Hypertension Maternal Grandmother   . Diabetes Maternal Grandmother   .  Diabetes Paternal Grandmother   . Hypertension Paternal Grandmother      Current Outpatient Medications:  .  amitriptyline (ELAVIL) 50 MG tablet, Take 1 tablet (50 mg total) by mouth at bedtime., Disp: 30 tablet, Rfl: 2 .  budesonide (ENTOCORT EC) 3 MG 24 hr capsule, Take 3 capsules (9 mg total) by mouth daily., Disp: 30 capsule, Rfl: 0 .  Calcium Carb-Cholecalciferol (CALCIUM 500 +D) 500-400 MG-UNIT TABS, Take 2 tablets by mouth daily., Disp: , Rfl:  .  diphenoxylate-atropine (LOMOTIL) 2.5-0.025 MG tablet, Take 2 tablets by mouth 4 (four) times daily., Disp: 30 tablet, Rfl: 0 .  HYDROcodone-acetaminophen (NORCO/VICODIN) 5-325 MG tablet, Take 1 tablet by mouth every 6 (six) hours as needed for moderate pain., Disp: 60 tablet, Rfl: 0 .  hydrocortisone (CORTEF) 10 MG tablet, Take 10 mg by mouth 3 (three) times daily., Disp: , Rfl:  .  Multiple Vitamins-Minerals (MULTIVITAMIN WITH MINERALS) tablet, Take 1 tablet by mouth daily., Disp: 30 tablet, Rfl: 0 .  OLANZapine zydis (ZYPREXA) 5 MG disintegrating tablet, Take 1 tablet (5 mg total) by mouth at bedtime., Disp: 30 tablet, Rfl: 0 .  ondansetron (ZOFRAN) 8 MG tablet, Take 1 tablet (8 mg total) by mouth every 8 (eight) hours as needed for nausea or vomiting., Disp: 30 tablet, Rfl: 3 .  pantoprazole (PROTONIX) 40 MG tablet, Take 1 tablet (40 mg total) by mouth daily., Disp: 30 tablet, Rfl: 0 .  phenazopyridine (PYRIDIUM) 200 MG tablet, Take 1 tablet (200 mg total) by mouth 3 (three) times daily as needed for pain., Disp: 10 tablet, Rfl: 0 .  promethazine (PHENERGAN) 25 MG tablet, Take 1 tablet (25 mg total) by mouth every 6 (six) hours as needed for nausea or vomiting., Disp: 30 tablet, Rfl: 0 .  VENTOLIN HFA 108 (90 Base) MCG/ACT inhaler, Inhale 1-2 puffs into the lungs every 4 (four) hours as needed for shortness of breath. , Disp: , Rfl: 0 .  VIBERZI 100 MG TABS, TAKE 1 TABLET BY MOUTH TWICE DAILY WITH  A  MEAL, Disp: 60 tablet, Rfl: 0 .   cyanocobalamin (,VITAMIN B-12,) 1000 MCG/ML injection, Inject 1ML every week x 4 weeks, then 1ML every other week x 2 months, then monthly x 3 months (Patient not taking: Reported on 10/08/2018), Disp: 10 mL, Rfl: 1 .  Syringe/Needle, Disp, (SYRINGE 3CC/22GX1") 22G X 1" 3 ML MISC, For use with Vitamin B12 injections (Patient not taking: Reported on 10/14/2018), Disp: 50 each, Rfl: 0 .  traZODone (DESYREL)  50 MG tablet, Take 0.5-1 tablets (25-50 mg total) by mouth at bedtime as needed for sleep. (Patient not taking: Reported on 10/08/2018), Disp: 30 tablet, Rfl: 0 No current facility-administered medications for this visit.   Facility-Administered Medications Ordered in Other Visits:  .  heparin lock flush 100 unit/mL, 500 Units, Intravenous, Once, Corcoran, Melissa C, MD .  sodium chloride 0.9 % 1,000 mL with potassium chloride 20 mEq infusion, , Intravenous, Once, Karen Kitchens, NP  Physical exam:  Vitals:   10/15/18 1132  BP: 137/85  Pulse: 82  Resp: 20  Temp: (!) 96.9 F (36.1 C)  TempSrc: Tympanic  Weight: 178 lb (80.7 kg)   Physical Exam Constitutional:      General: She is not in acute distress.    Appearance: Normal appearance.  HENT:     Head: Normocephalic and atraumatic.     Mouth/Throat:     Mouth: Mucous membranes are moist.     Pharynx: Oropharynx is clear.  Eyes:     General: No scleral icterus.    Conjunctiva/sclera: Conjunctivae normal.  Neck:     Musculoskeletal: Normal range of motion and neck supple.  Cardiovascular:     Rate and Rhythm: Normal rate and regular rhythm.  Pulmonary:     Effort: Pulmonary effort is normal.     Breath sounds: Normal breath sounds.  Abdominal:     General: Abdomen is flat. There is no distension.     Palpations: Abdomen is soft. There is no mass.     Tenderness: There is abdominal tenderness (RLQ & LLQ mildly ttp. No guarding or rebound).  Skin:    General: Skin is warm and dry.  Neurological:     Mental Status: She is  alert and oriented to person, place, and time. Mental status is at baseline.  Psychiatric:        Mood and Affect: Mood normal.        Behavior: Behavior normal.    Pelvic: exam chaperoned by nursing. Vulva- normal appearing without lesions. Vagina: shortened. Tolerated narrow speculum. Atrophic with pale mucosa. At 7:00 possible healed ulceration without evidence of bleeding and no blood in vaginal canal. Cervix & Uterus: surgically absent. RV: deferred.    CMP Latest Ref Rng & Units 10/14/2018  Glucose 70 - 99 mg/dL 110(H)  BUN 6 - 20 mg/dL 15  Creatinine 0.44 - 1.00 mg/dL 0.67  Sodium 135 - 145 mmol/L 141  Potassium 3.5 - 5.1 mmol/L 3.4(L)  Chloride 98 - 111 mmol/L 102  CO2 22 - 32 mmol/L 30  Calcium 8.9 - 10.3 mg/dL 9.1  Total Protein 6.5 - 8.1 g/dL -  Total Bilirubin 0.3 - 1.2 mg/dL -  Alkaline Phos 38 - 126 U/L -  AST 15 - 41 U/L -  ALT 0 - 44 U/L -   CBC Latest Ref Rng & Units 10/14/2018  WBC 4.0 - 10.5 K/uL 4.5  Hemoglobin 12.0 - 15.0 g/dL 12.7  Hematocrit 36.0 - 46.0 % 38.3  Platelets 150 - 400 K/uL 74(L)    No images are attached to the encounter.  Ct Chest W Contrast  Result Date: 09/30/2018 CLINICAL DATA:  Metastatic carcinoma. Cervical cancer diagnosed November 2017 status post radiation. Undergoing chemotherapy. EXAM: CT CHEST WITH CONTRAST TECHNIQUE: Multidetector CT imaging of the chest was performed during intravenous contrast administration. CONTRAST:  48m ISOVUE-300 IOPAMIDOL (ISOVUE-300) INJECTION 61% COMPARISON:  05/06/2018 FINDINGS: Cardiovascular: The heart is normal in size. No pericardial effusion. The aorta is normal in  caliber. No dissection. The branch vessels are patent. Scattered atherosclerotic calcifications at the ostia. No significant coronary artery calcifications. Mediastinum/Nodes: Small scattered mediastinal and hilar lymph nodes. 7 mm right hilar lymph node on image number 61 is stable. Small sub 5 mm mediastinal nodes are unchanged.  Lungs/Pleura: Innumerable solid and cavitary pulmonary nodules are again demonstrated but there has been a definite improvement since the prior CT scan. 8 mm cavitary lesion in the right upper lobe on image number 28 previously measured 10 mm. 7.5 mm left upper lobe nodule on image number 66 previously measured 9 mm. 6.5 mm right lower lobe pulmonary nodule on image number 80 previously measured 10 mm. 6 mm left lower lobe cavitary lesion on image number 105 previously measured 9 mm. Numerous other pulmonary nodules have also slightly decreased in size. No new or progressive findings. Stable emphysematous changes and areas of pulmonary scarring. Upper Abdomen: No significant upper abdominal findings. No evidence of hepatic metastatic disease. Status post cholecystectomy with stable common bile duct dilatation. No upper abdominal lymphadenopathy. Scattered stable retroperitoneal lymph nodes. Musculoskeletal: No significant bony findings. IMPRESSION: 1. Interval decrease in size and number of metastatic pulmonary nodules. 2. No new/progressive findings in the chest or upper abdomen. Aortic Atherosclerosis (ICD10-I70.0) and Emphysema (ICD10-J43.9). Electronically Signed   By: Marijo Sanes M.D.   On: 09/30/2018 09:53    Assessment and plan- Patient is a 52 y.o. female diagnosed with recurrent stage I B1 cervical adenocarcinoma entheses PD-L1 positive) status post aborted radical hysterectomy due to infected adnexa at [facility] on 11/2016.  She subsequently underwent chemo-radiation at [facility] with apparent complete response.  She developed a bowel obstruction with subsequent resection of terminal ileum and cecum on 08/2017 at [facility].  Imaging from 04/2018 revealed lung metastases.  She is currently on carboplatin and taxol s/p cycle 5 (09/30/18) with Udenyca support.  She presents to symptom management clinic for vaginal spotting.   Possible ulceration in vaginal without evidence of bleeding. Possibly related to Xarelto for  factor V Leiden and prior renal infarct which is now being held due to recent admission for rectal bleeding.  She is complaining of dysuria and increased abdominal pain so I think a CT abdomen pelvis with contrast is warranted to evaluate and rule out progression.  UA today consistent with UTI.  Will start Macrobid x 5 days and send for culture. She can increase frequency of hydrocodone to every 4 hours as needed and alternate with Tylenol. Can consider pyridium for UTI spasms.   Follow up with Dr. Mike Gip as scheduled or sooner if symptoms worsen or persist. Will call with results of CT Scan.     Visit Diagnosis 1. Malignant neoplasm of overlapping sites of cervix ([place])   2. Dysuria   3. Abnormal uterine and vaginal bleeding, unspecified     Patient expressed understanding and was in agreement with this plan. She also understands that She can call clinic at any time with any questions, concerns, or complaints.   Thank you for allowing me to participate in the care of this very pleasant patient.   Beckey Rutter, DNP, AGNP-C [facility] [place] (work cell) [phone] (office)  CC: Dr. Mike Gip Dr. Theora Gianotti

## 2018-10-16 LAB — URINE CULTURE: Culture: NO GROWTH

## 2018-10-17 ENCOUNTER — Encounter: Payer: Self-pay | Admitting: Nurse Practitioner

## 2018-10-17 ENCOUNTER — Observation Stay
Admission: AD | Admit: 2018-10-17 | Discharge: 2018-10-18 | Disposition: A | Discharge status: Home / Self Care | Payer: Medicaid Other | Source: Ambulatory Visit | Attending: Specialist | Admitting: Specialist

## 2018-10-17 ENCOUNTER — Other Ambulatory Visit: Payer: Self-pay | Admitting: Urgent Care

## 2018-10-17 ENCOUNTER — Ambulatory Visit
Admission: RE | Admit: 2018-10-17 | Discharge: 2018-10-17 | Disposition: A | Discharge status: Home / Self Care | Payer: Medicaid Other | Source: Ambulatory Visit | Attending: Nurse Practitioner | Admitting: Nurse Practitioner

## 2018-10-17 DIAGNOSIS — C7800 Secondary malignant neoplasm of unspecified lung: Secondary | ICD-10-CM | POA: Diagnosis not present

## 2018-10-17 DIAGNOSIS — D6959 Other secondary thrombocytopenia: Secondary | ICD-10-CM | POA: Insufficient documentation

## 2018-10-17 DIAGNOSIS — N939 Abnormal uterine and vaginal bleeding, unspecified: Secondary | ICD-10-CM

## 2018-10-17 DIAGNOSIS — R112 Nausea with vomiting, unspecified: Secondary | ICD-10-CM | POA: Diagnosis present

## 2018-10-17 DIAGNOSIS — K219 Gastro-esophageal reflux disease without esophagitis: Secondary | ICD-10-CM | POA: Diagnosis not present

## 2018-10-17 DIAGNOSIS — R011 Cardiac murmur, unspecified: Secondary | ICD-10-CM | POA: Diagnosis not present

## 2018-10-17 DIAGNOSIS — Z8541 Personal history of malignant neoplasm of cervix uteri: Secondary | ICD-10-CM | POA: Diagnosis not present

## 2018-10-17 DIAGNOSIS — M810 Age-related osteoporosis without current pathological fracture: Secondary | ICD-10-CM | POA: Insufficient documentation

## 2018-10-17 DIAGNOSIS — I700 Atherosclerosis of aorta: Secondary | ICD-10-CM | POA: Insufficient documentation

## 2018-10-17 DIAGNOSIS — Z9221 Personal history of antineoplastic chemotherapy: Secondary | ICD-10-CM | POA: Insufficient documentation

## 2018-10-17 DIAGNOSIS — R1031 Right lower quadrant pain: Secondary | ICD-10-CM | POA: Diagnosis present

## 2018-10-17 DIAGNOSIS — I10 Essential (primary) hypertension: Secondary | ICD-10-CM | POA: Diagnosis not present

## 2018-10-17 DIAGNOSIS — B1920 Unspecified viral hepatitis C without hepatic coma: Secondary | ICD-10-CM | POA: Insufficient documentation

## 2018-10-17 DIAGNOSIS — K56609 Unspecified intestinal obstruction, unspecified as to partial versus complete obstruction: Principal | ICD-10-CM | POA: Diagnosis present

## 2018-10-17 LAB — CBC WITH DIFFERENTIAL/PLATELET
Abs Immature Granulocytes: 0.01 10*3/uL (ref 0.00–0.07)
Basophils Absolute: 0 10*3/uL (ref 0.0–0.1)
Basophils Relative: 0 %
Eosinophils Absolute: 0 10*3/uL (ref 0.0–0.5)
Eosinophils Relative: 1 %
HCT: 33.8 % — ABNORMAL LOW (ref 36.0–46.0)
Hemoglobin: 10.7 g/dL — ABNORMAL LOW (ref 12.0–15.0)
IMMATURE GRANULOCYTES: 0 %
Lymphocytes Relative: 18 %
Lymphs Abs: 0.6 10*3/uL — ABNORMAL LOW (ref 0.7–4.0)
MCH: 33.1 pg (ref 26.0–34.0)
MCHC: 31.7 g/dL (ref 30.0–36.0)
MCV: 104.6 fL — ABNORMAL HIGH (ref 80.0–100.0)
Monocytes Absolute: 0.2 10*3/uL (ref 0.1–1.0)
Monocytes Relative: 6 %
NEUTROS PCT: 75 %
Neutro Abs: 2.6 10*3/uL (ref 1.7–7.7)
Platelets: 73 10*3/uL — ABNORMAL LOW (ref 150–400)
RBC: 3.23 MIL/uL — ABNORMAL LOW (ref 3.87–5.11)
RDW: 15.2 % (ref 11.5–15.5)
WBC: 3.5 10*3/uL — ABNORMAL LOW (ref 4.0–10.5)
nRBC: 0 % (ref 0.0–0.2)

## 2018-10-17 LAB — COMPREHENSIVE METABOLIC PANEL
ALT: 39 U/L (ref 0–44)
AST: 28 U/L (ref 15–41)
Albumin: 3.5 g/dL (ref 3.5–5.0)
Alkaline Phosphatase: 181 U/L — ABNORMAL HIGH (ref 38–126)
Anion gap: 6 (ref 5–15)
BUN: 18 mg/dL (ref 6–20)
CO2: 24 mmol/L (ref 22–32)
Calcium: 8.5 mg/dL — ABNORMAL LOW (ref 8.9–10.3)
Chloride: 104 mmol/L (ref 98–111)
Creatinine, Ser: 0.64 mg/dL (ref 0.44–1.00)
GFR calc Af Amer: >60 mL/min (ref >60)
GFR calc non Af Amer: >60 mL/min (ref >60)
GLUCOSE: 91 mg/dL (ref 70–99)
Potassium: 4.3 mmol/L (ref 3.5–5.1)
Sodium: 134 mmol/L — ABNORMAL LOW (ref 135–145)
Total Bilirubin: 0.5 mg/dL (ref 0.3–1.2)
Total Protein: 6.7 g/dL (ref 6.5–8.1)

## 2018-10-17 LAB — MAGNESIUM: Magnesium: 1.5 mg/dL — ABNORMAL LOW (ref 1.7–2.4)

## 2018-10-17 MED ORDER — ACETAMINOPHEN 650 MG RE SUPP: 650.0000 mg | Freq: Four times a day (QID) | RECTAL | Status: DC | PRN

## 2018-10-17 MED ORDER — ONDANSETRON HCL 4 MG/2ML IJ SOLN
4.0000 mg | Freq: Four times a day (QID) | INTRAMUSCULAR | Status: DC | PRN
  Administered 2018-10-17 – 2018-10-18 (×3): 4 mg via INTRAVENOUS
  Filled 2018-10-17 (×4): qty 2

## 2018-10-17 MED ORDER — INFLUENZA VAC SPLIT QUAD 0.5 ML IM SUSY: 0.5000 mL | PREFILLED_SYRINGE | INTRAMUSCULAR | Status: DC

## 2018-10-17 MED ORDER — POLYETHYLENE GLYCOL 3350 17 G PO PACK
17.0000 g | PACK | Freq: Once | ORAL | Status: AC
  Administered 2018-10-17: 20:00:00 17 g via ORAL
  Filled 2018-10-17: qty 1

## 2018-10-17 MED ORDER — PANTOPRAZOLE SODIUM 40 MG PO TBEC
40.0000 mg | DELAYED_RELEASE_TABLET | Freq: Every day | ORAL | Status: DC
  Administered 2018-10-18: 40 mg via ORAL
  Filled 2018-10-17: qty 1

## 2018-10-17 MED ORDER — AMITRIPTYLINE HCL 25 MG PO TABS
50.0000 mg | ORAL_TABLET | Freq: Every day | ORAL | Status: DC
  Administered 2018-10-17: 22:00:00 50 mg via ORAL
  Filled 2018-10-17: qty 2

## 2018-10-17 MED ORDER — HYDROCORTISONE 10 MG PO TABS
10.0000 mg | ORAL_TABLET | Freq: Three times a day (TID) | ORAL | Status: DC
  Administered 2018-10-17 – 2018-10-18 (×3): 10 mg via ORAL
  Filled 2018-10-17 (×4): qty 1

## 2018-10-17 MED ORDER — MAGNESIUM SULFATE 2 GM/50ML IV SOLN
2.0000 g | Freq: Once | INTRAVENOUS | Status: AC
  Administered 2018-10-17: 2 g via INTRAVENOUS
  Filled 2018-10-17: qty 50

## 2018-10-17 MED ORDER — ACETAMINOPHEN 325 MG PO TABS: 650.0000 mg | ORAL_TABLET | Freq: Four times a day (QID) | ORAL | Status: DC | PRN

## 2018-10-17 MED ORDER — BUDESONIDE 3 MG PO CPEP
9.0000 mg | ORAL_CAPSULE | Freq: Every day | ORAL | Status: DC
  Administered 2018-10-18: 11:00:00 9 mg via ORAL
  Filled 2018-10-17: qty 3

## 2018-10-17 MED ORDER — HYDROCODONE-ACETAMINOPHEN 5-325 MG PO TABS
1.0000 | ORAL_TABLET | ORAL | Status: DC | PRN
  Administered 2018-10-17: 2 via ORAL
  Filled 2018-10-17: qty 2

## 2018-10-17 MED ORDER — MORPHINE SULFATE (PF) 2 MG/ML IV SOLN
2.0000 mg | INTRAVENOUS | Status: DC | PRN
  Administered 2018-10-17 – 2018-10-18 (×5): 2 mg via INTRAVENOUS
  Filled 2018-10-17 (×6): qty 1

## 2018-10-17 MED ORDER — NITROFURANTOIN MONOHYD MACRO 100 MG PO CAPS
100.0000 mg | ORAL_CAPSULE | Freq: Two times a day (BID) | ORAL | Status: DC
  Administered 2018-10-17 – 2018-10-18 (×2): 100 mg via ORAL
  Filled 2018-10-17 (×4): qty 1

## 2018-10-17 MED ORDER — POLYETHYLENE GLYCOL 3350 17 G PO PACK: 17.0000 g | PACK | Freq: Every day | ORAL | Status: DC | PRN

## 2018-10-17 MED ORDER — SENNA 8.6 MG PO TABS
1.0000 | ORAL_TABLET | Freq: Two times a day (BID) | ORAL | Status: DC
  Administered 2018-10-17 – 2018-10-18 (×2): 8.6 mg via ORAL
  Filled 2018-10-17 (×2): qty 1

## 2018-10-17 MED ORDER — IOPAMIDOL (ISOVUE-300) INJECTION 61%
85.0000 mL | Freq: Once | INTRAVENOUS | Status: AC | PRN
  Administered 2018-10-17: 85 mL via INTRAVENOUS

## 2018-10-17 MED ORDER — ALBUTEROL SULFATE (2.5 MG/3ML) 0.083% IN NEBU: 2.5000 mg | INHALATION_SOLUTION | RESPIRATORY_TRACT | Status: DC | PRN

## 2018-10-17 MED ORDER — ONDANSETRON HCL 4 MG PO TABS: 4.0000 mg | ORAL_TABLET | Freq: Four times a day (QID) | ORAL | Status: DC | PRN

## 2018-10-17 NOTE — Telephone Encounter (Signed)
Discussed case with Dr. Estanislado Pandy who accepts for admission. Discussed with patient and instructions for direct admit provided.

## 2018-10-17 NOTE — Progress Notes (Signed)
Received below results of  CT Abdomen Pelvis w contrast :  IMPRESSION: 1. Exam positive for small bowel obstruction. Transition point is in the left hemiabdomen. No discrete obstructing mass noted. Findings may reflect adhesions. 2. No mass identified within the uterus or cervix or distal bowel to explain patient's vaginal and rectal bleeding. Correlation with direct visualization is advised. If further imaging is clinically indicated a contrast enhanced pelvic MRI may be helpful. 3. Bilateral pulmonary nodules compatible with metastatic disease. 4.  Aortic Atherosclerosis (ICD10-I70.0). 5. These results will be called to the ordering clinician or representative by the Radiologist Assistant, and communication documented in the PACS or zVision Dashboard.   Electronically Signed   By: Kerby Moors M.D.   On: 10/17/2018 13:26  Discussed results with Dr.  Norris, GI who recommends hospital admission with decompression and surgical and GI consult for management of small bowel obstruction and pain control.   Discussed results with patient who reports continued to increased pain. She is agreeable to hospitalization. Will discuss with admitting hospitalist for possible direct admit vs ER.

## 2018-10-17 NOTE — H&P (Addendum)
[facility] NAME: Joanna Hall    MR#:  [id number]  DATE OF BIRTH:  Jul 04, 1966  DATE OF ADMISSION:  10/17/2018  PRIMARY CARE PHYSICIAN: Arnetha Courser, MD   REQUESTING/REFERRING PHYSICIAN: Dr. Mike Gip from cancer center  CHIEF COMPLAINT:  No chief complaint on file.  Abdominal pain, nausea and vomiting  HISTORY OF PRESENT ILLNESS:  Joanna Hall  is a 53 y.o. female with a known history of metastatic cervical cancer, prior small bowel obstruction needing surgery, chronic rectal bleeding presents to the hospital to review admission from home after she had an outpatient CT scan of the abdomen and pelvis which raise concern for possible small bowel obstruction.  Patient had a normal bowel movement earlier and is passing gas.  Has chronic nausea and vomiting which are unchanged.  Chronic abdominal pain is the same.  She has had recent vaginal bleeding which is being followed at the cancer center thought to be from her cervical cancer.  She continues to have chemotherapy with last round 3 weeks back. Afebrile.  PAST MEDICAL HISTORY:   Past Medical History:  Diagnosis Date  . Abnormal cervical Papanicolaou smear 09/18/2017  . Anxiety   . Aortic atherosclerosis ([place])   . Arthritis    neck and knees  . 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 Blood transfusion without reported diagnosis   . Cervical cancer ([place]) 09/2016   mets lung  . Chronic anal fissure   . Chronic diarrhea   . Dyspnea   . Erosive gastropathy 09/18/2017  . Factor V Leiden mutation ([place])   . Fecal incontinence   . Genital warts   . GERD (gastroesophageal reflux disease)   . Heart murmur   . Hemorrhoids   . 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 Hepatitis, chronic ([place]) 05/05/2017  . History of cancer chemotherapy    completed 06/2017  . History of Clostridium difficile infection    while undergoing chemo.   Negative test 10/2017  . Ileocolic anastomotic leak   . 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 Intestinal infection due to Clostridium difficile 09/18/2017  . Macrocytic anemia with vitamin B12 deficiency   . Nausea vomiting and diarrhea   . Pancolitis ([place]) 07/27/2018  . Perianal condylomata   . Pneumonia    History of  . Pulmonary nodules   . Rectal bleeding   . Small bowel obstruction ([place]) 08/2017  . Stiff neck    limited right turn  . Vitamin D deficiency     PAST SURGICAL HISTORY:   Past Surgical History:  Procedure Laterality Date  . CHOLECYSTECTOMY    . COLON SURGERY  08/2017   resection  . COLONOSCOPY WITH PROPOFOL N/A 12/20/2017   Procedure: COLONOSCOPY WITH PROPOFOL;  Surgeon: [name], MD;  Location: [facility] ENDOSCOPY;  Service: Gastroenterology;  Laterality: N/A;  . COLONOSCOPY WITH PROPOFOL N/A 07/30/2018   Procedure: COLONOSCOPY WITH PROPOFOL;  Surgeon: [name], MD;  Location: [facility] ENDOSCOPY;  Service: Gastroenterology;  Laterality: N/A;  . COLONOSCOPY WITH PROPOFOL N/A 10/10/2018   Procedure: COLONOSCOPY WITH PROPOFOL;  Surgeon: Lucilla Lame, MD;  Location: [facility] ENDOSCOPY;  Service: Endoscopy;  Laterality: N/A;  . DIAGNOSTIC LAPAROSCOPY    . ESOPHAGOGASTRODUODENOSCOPY (EGD) WITH PROPOFOL N/A 12/20/2017   Procedure: ESOPHAGOGASTRODUODENOSCOPY (EGD) WITH PROPOFOL;  Surgeon: [name], MD;  Location:  [facility] ENDOSCOPY;  Service: Gastroenterology;  Laterality: N/A;  . ESOPHAGOGASTRODUODENOSCOPY (EGD) WITH PROPOFOL  07/30/2018   Procedure: ESOPHAGOGASTRODUODENOSCOPY (EGD) WITH PROPOFOL;  Surgeon: [name], MD;  Location: [facility] ENDOSCOPY;  Service: Gastroenterology;;  . LAPAROTOMY N/A 08/31/2017   Procedure: EXPLORATORY LAPAROTOMY for SBO, ileocolectomy, removal of piece of uterine wall;  Surgeon: [name], MD;  Location: [facility] ORS;  Service: General;  Laterality: N/A;  . LASER ABLATION CONDOLAMATA N/A 02/22/2018    Procedure: LASER ABLATION/REMOVAL OF [id number] AROUND ANUS AND VAGINA;  Surgeon: Michael Boston, MD;  Location: [place];  Service: General;  Laterality: N/A;  . OOPHORECTOMY    . PORTA CATH INSERTION N/A 05/13/2018   Procedure: PORTA CATH INSERTION;  Surgeon: Katha Cabal, MD;  Location: [facility] LAB;  Service: Cardiovascular;  Laterality: N/A;  . SMALL INTESTINE SURGERY    . TANDEM RING INSERTION     x3  . THORACOTOMY    . TOTAL KNEE ARTHROPLASTY Left 04/24/2018   Procedure: TOTAL KNEE ARTHROPLASTY;  Surgeon: Lovell Sheehan, MD;  Location: [facility] ORS;  Service: Orthopedics;  Laterality: Left;    SOCIAL HISTORY:   Social History   Tobacco Use  . Smoking status: Former Smoker    Last attempt to quit: 10/16/2006    Years since quitting: 12.0  . Smokeless tobacco: Never Used  Substance Use Topics  . Alcohol use: Not Currently    Frequency: Never    Comment: seldom    FAMILY HISTORY:   Family History  Problem Relation Age of Onset  . Hypertension Father   . Diabetes Father   . Alcohol abuse Daughter   . Hypertension Maternal Grandmother   . Diabetes Maternal Grandmother   . Diabetes Paternal Grandmother   . Hypertension Paternal Grandmother     DRUG ALLERGIES:   Allergies  Allergen Reactions  . Ketamine Anxiety and Other (See Comments)    Syncope episode/confusion     REVIEW OF SYSTEMS:   Review of Systems  Constitutional: Positive for malaise/fatigue. Negative for chills and fever.  HENT: Negative for sore throat.   Eyes: Negative for blurred vision, double vision and pain.  Respiratory: Negative for cough, hemoptysis, shortness of breath and wheezing.   Cardiovascular: Negative for chest pain, palpitations, orthopnea and leg swelling.  Gastrointestinal: Positive for abdominal pain, nausea and vomiting. Negative for constipation, diarrhea and heartburn.  Genitourinary: Negative for dysuria and hematuria.  Musculoskeletal: Negative  for back pain and joint pain.  Skin: Negative for rash.  Neurological: Negative for sensory change, speech change, focal weakness and headaches.  Endo/Heme/Allergies: Does not bruise/bleed easily.  Psychiatric/Behavioral: Negative for depression. The patient is not nervous/anxious.     MEDICATIONS AT HOME:   Prior to Admission medications   Medication Sig Start Date End Date Taking? Authorizing Provider  amitriptyline (ELAVIL) 50 MG tablet Take 1 tablet (50 mg total) by mouth at bedtime. 08/19/18 10/17/18 Yes Vanga, Tally Due, MD  budesonide (ENTOCORT EC) 3 MG 24 hr capsule Take 3 capsules (9 mg total) by mouth daily. 10/12/18  Yes Gouru, Illene Silver, MD  Calcium Carb-Cholecalciferol (CALCIUM 500 +D) 500-400 MG-UNIT TABS Take 2 tablets by mouth daily.   Yes [provider]  diphenoxylate-atropine (LOMOTIL) 2.5-0.025 MG tablet Take 2 tablets by mouth 4 (four) times daily. 10/11/18  Yes Gouru, Illene Silver, MD  HYDROcodone-acetaminophen (NORCO/VICODIN) 5-325 MG tablet Take 1 tablet by mouth every 6 (six) hours as needed for moderate pain. 10/04/18  Yes Jacquelin Hawking, NP  hydrocortisone (CORTEF) 10 MG tablet Take 10 mg by mouth 3 (three) times daily.   Yes [provider]  Multiple Vitamins-Minerals (MULTIVITAMIN WITH MINERALS) tablet Take 1 tablet by mouth daily. 08/01/18 08/01/19 Yes Wieting, Richard, MD  nitrofurantoin, macrocrystal-monohydrate, (MACROBID) 100 MG capsule Take 1 capsule (100 mg total) by mouth 2 (two) times daily. For UTI 10/15/18  Yes Verlon Au, NP  ondansetron (ZOFRAN) 8 MG tablet Take 1 tablet (8 mg total) by mouth every 8 (eight) hours as needed for nausea or vomiting. 08/23/18  Yes Karen Kitchens, NP  pantoprazole (PROTONIX) 40 MG tablet Take 1 tablet (40 mg total) by mouth daily. 08/01/18  Yes Loletha Grayer, MD  promethazine (PHENERGAN) 25 MG tablet Take 1 tablet (25 mg total) by mouth every 6 (six) hours as needed for nausea or vomiting. 10/17/18  Yes Karen Kitchens, NP  VENTOLIN HFA 108 (90 Base) MCG/ACT inhaler Inhale 1-2 puffs into the lungs every 4 (four) hours as needed for shortness of breath.    Yes [provider]  VIBERZI 100 MG TABS TAKE 1 TABLET BY MOUTH TWICE DAILY WITH  A  MEAL 10/11/18  Yes Verlon Au, NP  cyanocobalamin (,VITAMIN B-12,) 1000 MCG/ML injection Inject 1ML every week x 4 weeks, then 1ML every other week x 2 months, then monthly x 3 months Patient not taking: Reported on 10/08/2018 11/29/17   [name], MD  OLANZapine zydis (ZYPREXA) 5 MG disintegrating tablet Take 1 tablet (5 mg total) by mouth at bedtime. Patient not taking: Reported on 10/17/2018 08/01/18   Loletha Grayer, MD  phenazopyridine (PYRIDIUM) 200 MG tablet Take 1 tablet (200 mg total) by mouth 3 (three) times daily as needed for pain. Patient not taking: Reported on 10/17/2018 10/07/18   Jacquelin Hawking, NP  Syringe/Needle, Disp, (SYRINGE 3CC/22GX1") 22G X 1" 3 ML MISC For use with Vitamin B12 injections Patient not taking: Reported on 10/14/2018 04/12/18   [name], MD  traZODone (DESYREL) 50 MG tablet Take 0.5-1 tablets (25-50 mg total) by mouth at bedtime as needed for sleep. Patient not taking: Reported on 10/08/2018 08/13/18   Karen Kitchens, NP     VITAL SIGNS:  There were no vitals taken for this visit.  PHYSICAL EXAMINATION:  Physical Exam  GENERAL:  53 y.o.-year-old patient lying in the bed with no acute distress.  Pale EYES: Pupils equal, round, reactive to light and accommodation. No scleral icterus. Extraocular muscles intact.  HEENT: Head atraumatic, normocephalic. Oropharynx and nasopharynx clear. No oropharyngeal erythema, moist oral mucosa  NECK:  Supple, no jugular venous distention. No thyroid enlargement, no tenderness.  LUNGS: Normal breath sounds bilaterally, no wheezing, rales, rhonchi. No use of accessory muscles of respiration.  CARDIOVASCULAR: S1, S2 normal. No murmurs, rubs, or gallops.  ABDOMEN:  Soft, lower abdominal tenderness, nondistended. Bowel sounds present. No organomegaly or mass.  EXTREMITIES: No pedal edema, cyanosis, or clubbing. + 2 pedal & radial pulses b/l.   NEUROLOGIC: Cranial nerves II through XII are intact. No focal Motor or sensory deficits appreciated b/l PSYCHIATRIC: The patient is alert and oriented x 3. Good affect.  SKIN: No obvious rash, lesion, or ulcer.   LABORATORY PANEL:   CBC Recent Labs  Lab 10/14/18 0836  WBC 4.5  HGB 12.7  HCT 38.3  PLT 74*   ------------------------------------------------------------------------------------------------------------------  Chemistries  Recent Labs  Lab 10/14/18 0836  NA 141  K 3.4*  CL 102  CO2 30  GLUCOSE 110*  BUN 15  CREATININE 0.67  CALCIUM 9.1  MG 1.3*   ------------------------------------------------------------------------------------------------------------------  Cardiac Enzymes No results for input(s): TROPONINI in the last 168 hours. ------------------------------------------------------------------------------------------------------------------  RADIOLOGY:  Ct Abdomen Pelvis W Contrast  Result Date: 10/17/2018 CLINICAL DATA:  Vaginal bleeding. Rectal bleeding. Right abdominal pain. Metastatic cervical cancer with pulmonary metastasis. EXAM: CT ABDOMEN AND PELVIS WITH CONTRAST TECHNIQUE: Multidetector CT imaging of the abdomen and pelvis was performed using the standard protocol following bolus administration of intravenous contrast. CONTRAST:  51m ISOVUE-300 IOPAMIDOL (ISOVUE-300) INJECTION 61% COMPARISON:  09/07/2018 FINDINGS: Lower chest: Multiple pulmonary nodules are identified in both lungs compatible with metastatic disease. No acute abnormality identified. No pleural effusion. Hepatobiliary: No suspicious liver lesion. Previous cholecystectomy. Increase caliber of the common bile duct measures up to 1.6 cm. Pancreas: Unremarkable. No pancreatic ductal dilatation or surrounding  inflammatory changes. Spleen: Normal in size without focal abnormality. Adrenals/Urinary Tract: The adrenal glands appear normal. No enhancing kidney mass or hydronephrosis identified. Mild diffuse bladder wall thickening with mucosal enhancement. No suspicious filling defects identified within the urinary bladder. No gas identified to suggest fistula. Stomach/Bowel: Moderate distension of the stomach. The proximal small bowel loops are abnormally dilated and measure up to 4.9 cm. Transition to decreased caliber a mid and distal small bowel loops noted in the left hemiabdomen, image 49/2 and image 67/4. Postoperative change from distal small bowel obstruction with entero colonic anastomosis is identified within the right hemiabdomen, image 80/4. Mild small bowel wall thickening just proximal to the anastomosis noted. There is mild wall thickening involving the sigmoid colon and rectum. No discrete mass identified. Vascular/Lymphatic: Aortic atherosclerosis. No aneurysm. Left retroperitoneal lymph node measures 0.9 cm, image 31/2. Unchanged from previous exam. Reproductive: No discrete mass identified. There is increased enhancement involving the uterus and cervix. No adnexal mass identified. Other: No free fluid or fluid collections within the abdomen or pelvis. No peritoneal nodularity identified. Musculoskeletal: Scoliosis and degenerative disc disease identified. No acute or significant osseous abnormalities. IMPRESSION: 1. Exam positive for small bowel obstruction. Transition point is in the left hemiabdomen. No discrete obstructing mass noted. Findings may reflect adhesions. 2. No mass identified within the uterus or cervix or distal bowel to explain patient's vaginal and rectal bleeding. Correlation with direct visualization is advised. If further imaging is clinically indicated a contrast enhanced pelvic MRI may be helpful. 3. Bilateral pulmonary nodules compatible with metastatic disease. 4.  Aortic  Atherosclerosis (ICD10-I70.0). 5. These results will be called to the ordering clinician or representative by the Radiologist Assistant, and communication documented in the PACS or zVision Dashboard. Electronically Signed   By: TKerby MoorsM.D.   On: 10/17/2018 13:26     IMPRESSION AND PLAN:   *Mild bowel obstruction on CT scan of the abdomen and pelvis.  Patient had normal bowel movement and passing gas.  Chronic nausea and vomiting of the same.  Patient with prior surgical changes in her abdomen 2.  At this point will put her on clear liquids.  Repeat abdominal x-ray in the morning.  I have consulted Dr. DRosana Hoesof surgery who was involved in her prior surgical care.  Pain and nausea medications as needed  *Metastatic cervical cancer with vaginal bleeding.  Monitor at this time.  Will repeat CBC.  *Thrombocytopenia found recently on lab work likely to chemotherapy.  Repeat CBC.  No Lovenox or heparin.  She also has history of hepatitis C  *UTI diagnosed yesterday at the cancer center and started on nitrofurantoin.  We will continue antibiotic orally here.  *DVT prophylaxis with SCDs  All the records are reviewed and case discussed with ED provider. Management plans discussed with the patient, family and they are in agreement.  CODE STATUS: Full code  TOTAL TIME TAKING CARE OF THIS PATIENT: 40 minutes.   Leia Alf Carsen Machi M.D on 10/17/2018 at 7:12 PM  Between 7am to 6pm - Pager - [phone]  After 6pm go to [email or web address] - password [email or web address] [facility]  Office  [phone]  CC: Primary care physician; Arnetha Courser, MD  Note: This dictation was prepared with Dragon dictation along with smaller phrase technology. Any transcriptional errors that result from this process are unintentional.

## 2018-10-17 NOTE — Progress Notes (Signed)
Advance care planning  Purpose of Encounter Small bowel obstruction, cervical cancer  Parties in Attendance Patient  Patients Decisional capacity Patient is alert and oriented.  Does not have a documented healthcare power of attorney or advanced directives.  Wants her daughter to make decisions if she is unable to.  Discussed regarding reason for admission, prognosis and treatment plan  CODE STATUS discussed and patient is clear that she would like full scope of treatment including intubation and CPR if needed.  Orders entered for full code.  Time spent -17 minutes

## 2018-10-18 ENCOUNTER — Telehealth: Payer: Self-pay | Admitting: Licensed Clinical Social Worker

## 2018-10-18 ENCOUNTER — Other Ambulatory Visit: Payer: Self-pay

## 2018-10-18 ENCOUNTER — Inpatient Hospital Stay: Payer: Medicaid Other

## 2018-10-18 ENCOUNTER — Observation Stay: Payer: Medicaid Other

## 2018-10-18 DIAGNOSIS — K56609 Unspecified intestinal obstruction, unspecified as to partial versus complete obstruction: Secondary | ICD-10-CM | POA: Diagnosis not present

## 2018-10-18 DIAGNOSIS — K5651 Intestinal adhesions [bands], with partial obstruction: Secondary | ICD-10-CM | POA: Diagnosis not present

## 2018-10-18 LAB — BASIC METABOLIC PANEL
Anion gap: 7 (ref 5–15)
BUN: 14 mg/dL (ref 6–20)
CO2: 26 mmol/L (ref 22–32)
CREATININE: 0.7 mg/dL (ref 0.44–1.00)
Calcium: 9 mg/dL (ref 8.9–10.3)
Chloride: 102 mmol/L (ref 98–111)
GFR calc Af Amer: >60 mL/min (ref >60)
GFR calc non Af Amer: >60 mL/min (ref >60)
Glucose, Bld: 111 mg/dL — ABNORMAL HIGH (ref 70–99)
Potassium: 4 mmol/L (ref 3.5–5.1)
Sodium: 135 mmol/L (ref 135–145)

## 2018-10-18 LAB — MAGNESIUM: Magnesium: 1.9 mg/dL (ref 1.7–2.4)

## 2018-10-18 LAB — CBC
HCT: 36 % (ref 36.0–46.0)
Hemoglobin: 11.4 g/dL — ABNORMAL LOW (ref 12.0–15.0)
MCH: 33 pg (ref 26.0–34.0)
MCHC: 31.7 g/dL (ref 30.0–36.0)
MCV: 104.3 fL — ABNORMAL HIGH (ref 80.0–100.0)
Platelets: 81 10*3/uL — ABNORMAL LOW (ref 150–400)
RBC: 3.45 MIL/uL — ABNORMAL LOW (ref 3.87–5.11)
RDW: 15 % (ref 11.5–15.5)
WBC: 2.9 10*3/uL — ABNORMAL LOW (ref 4.0–10.5)
nRBC: 0 % (ref 0.0–0.2)

## 2018-10-18 MED ORDER — SODIUM CHLORIDE 0.9 % IV SOLN
INTRAVENOUS | Status: DC | PRN
  Administered 2018-10-18: 14:00:00 250 mL via INTRAVENOUS

## 2018-10-18 MED ORDER — MAGNESIUM SULFATE 2 GM/50ML IV SOLN
2.0000 g | Freq: Once | INTRAVENOUS | Status: AC
  Administered 2018-10-18: 2 g via INTRAVENOUS
  Filled 2018-10-18: qty 50

## 2018-10-18 MED ORDER — HEPARIN SOD (PORK) LOCK FLUSH 100 UNIT/ML IV SOLN
500.0000 [IU] | Freq: Once | INTRAVENOUS | Status: AC
  Administered 2018-10-18: 18:00:00 500 [IU] via INTRAVENOUS
  Filled 2018-10-18: qty 5

## 2018-10-18 NOTE — Clinical Social Work Note (Signed)
[name] spoke with patient regarding EMMI call concerns of depression. Patient reports that she has a history of Depression and has been on medication for it for several years. Patient lives with her parents and states that she currently is not experiencing any depression. Patient reports that she only answered the EMMI call that way due to her history of depression. Patient reports that she sees her primary care physician for her depression and is not interested in any other resources at this time. Patient reports that she does not have any SI or HI at this time. Patient thanked [name] for checking on her and reports that she will follow up with her primary physician when she returns home.   Joanna Hall, Joanna Hall

## 2018-10-18 NOTE — Discharge Summary (Signed)
[facility] NAME: Joanna Hall    MR#:  [id number]  DATE OF BIRTH:  09/15/1966  DATE OF ADMISSION:  10/17/2018 ADMITTING PHYSICIAN: Saundra Shelling, MD  DATE OF DISCHARGE: 10/18/2018  PRIMARY CARE PHYSICIAN: Arnetha Courser, MD    ADMISSION DIAGNOSIS:  small bowel obst  DISCHARGE DIAGNOSIS:  Active Problems:   SBO (small bowel obstruction) ([place])   SECONDARY DIAGNOSIS:   Past Medical History:  Diagnosis Date  . Abnormal cervical Papanicolaou smear 09/18/2017  . Anxiety   . Aortic atherosclerosis ([place])   . Arthritis    neck and knees  . 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 Blood transfusion without reported diagnosis   . Cervical cancer ([place]) 09/2016   mets lung  . Chronic anal fissure   . Chronic diarrhea   . Dyspnea   . Erosive gastropathy 09/18/2017  . Factor V Leiden mutation ([place])   . Fecal incontinence   . Genital warts   . GERD (gastroesophageal reflux disease)   . Heart murmur   . Hemorrhoids   . 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 Hepatitis, chronic ([place]) 05/05/2017  . History of cancer chemotherapy    completed 06/2017  . History of Clostridium difficile infection    while undergoing chemo.  Negative test 10/2017  . Ileocolic anastomotic leak   . 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 Intestinal infection due to Clostridium difficile 09/18/2017  . Macrocytic anemia with vitamin B12 deficiency   . Nausea vomiting and diarrhea   . Pancolitis ([place]) 07/27/2018  . Perianal condylomata   . Pneumonia    History of  . Pulmonary nodules   . Rectal bleeding   . Small bowel obstruction ([place]) 08/2017  . Stiff neck    limited right turn  . Vitamin D deficiency     HOSPITAL COURSE:   53 year old female with past medical history of cervical cancer currently ongoing treatment, history of factor V Leiden mutation, hypertension, GERD, chronic  nausea/abdominal pain, history of small bowel obstruction who presented to the hospital due to abdominal pain nausea vomiting and suspected to have a partial small bowel obstruction.  1.  Partial small bowel obstruction-patient presented with symptoms of abdominal pain nausea and vomiting and CT scan of the abdomen pelvis done as an outpatient showing partial small bowel obstruction. - Seen by general surgery and as per them patient had no evidence of bowel obstruction clinically as she had a bowel movement was passing flatus and had a benign looking abdomen.  They recommended supportive care with clear liquid diet and IV fluids and antiemetics. - Patient's symptoms have now improved, her x-ray on the day of discharge showing no evidence of small bowel obstruction.  Surgery has signed off and her diet has been advanced and she is tolerating it well.  She is therefore being discharged home.  2.  History of cervical cancer-patient is currently ongoing treatment.  Continue follow-up with Dr. Mike Gip as an outpatient.  3.  Hypomagnesemia- this has been supplemented and can be further followed as an outpatient.  Patient's gets magnesium infusions at the cancer center.  4.  History of recent urinary tract infection- patient will continue to finish her Macrobid course.  5.  Osteoporosis-patient will continue her calcium vitamin D supplements.  6.  GERD-patient will continue Protonix.  7.  Thrombocytopenia - due to underlying malignancy.  No acute bleeding.   - can be further followed as outpatient.    DISCHARGE CONDITIONS:   Full code  CONSULTS OBTAINED:  Treatment Team:  Vickie Epley, MD  DRUG ALLERGIES:   Allergies  Allergen Reactions  . Ketamine Anxiety and Other (See Comments)    Syncope episode/confusion     DISCHARGE MEDICATIONS:   Allergies as of 10/18/2018      Reactions   Ketamine Anxiety, Other (See Comments)   Syncope episode/confusion      Medication List     STOP taking these medications   cyanocobalamin 1000 MCG/ML injection Commonly known as:  (VITAMIN B-12)   OLANZapine zydis 5 MG disintegrating tablet Commonly known as:  ZYPREXA   phenazopyridine 200 MG tablet Commonly known as:  PYRIDIUM   SYRINGE 3CC/22GX1" 22G X 1" 3 ML Misc   traZODone 50 MG tablet Commonly known as:  DESYREL     TAKE these medications   amitriptyline 50 MG tablet Commonly known as:  ELAVIL Take 1 tablet (50 mg total) by mouth at bedtime.   budesonide 3 MG 24 hr capsule Commonly known as:  ENTOCORT EC Take 3 capsules (9 mg total) by mouth daily.   CALCIUM 500 +D 500-400 MG-UNIT Tabs Generic drug:  Calcium Carb-Cholecalciferol Take 2 tablets by mouth daily.   diphenoxylate-atropine 2.5-0.025 MG tablet Commonly known as:  LOMOTIL Take 2 tablets by mouth 4 (four) times daily.   HYDROcodone-acetaminophen 5-325 MG tablet Commonly known as:  NORCO/VICODIN Take 1 tablet by mouth every 6 (six) hours as needed for moderate pain.   hydrocortisone 10 MG tablet Commonly known as:  CORTEF Take 10 mg by mouth 3 (three) times daily.   multivitamin with minerals tablet Take 1 tablet by mouth daily.   nitrofurantoin (macrocrystal-monohydrate) 100 MG capsule Commonly known as:  MACROBID Take 1 capsule (100 mg total) by mouth 2 (two) times daily. For UTI   ondansetron 8 MG tablet Commonly known as:  ZOFRAN Take 1 tablet (8 mg total) by mouth every 8 (eight) hours as needed for nausea or vomiting.   pantoprazole 40 MG tablet Commonly known as:  PROTONIX Take 1 tablet (40 mg total) by mouth daily.   promethazine 25 MG tablet Commonly known as:  PHENERGAN Take 1 tablet (25 mg total) by mouth every 6 (six) hours as needed for nausea or vomiting.   VENTOLIN HFA 108 (90 Base) MCG/ACT inhaler Generic drug:  albuterol Inhale 1-2 puffs into the lungs every 4 (four) hours as needed for shortness of breath.   VIBERZI 100 MG Tabs Generic drug:   Eluxadoline TAKE 1 TABLET BY MOUTH TWICE DAILY WITH  A  MEAL         DISCHARGE INSTRUCTIONS:   DIET:  Regular diet  DISCHARGE CONDITION:  Stable  ACTIVITY:  Activity as tolerated  OXYGEN:  Home Oxygen: No.   Oxygen Delivery: room air  DISCHARGE LOCATION:  home   If you experience worsening of your admission symptoms, develop shortness of breath, life threatening emergency, suicidal or homicidal thoughts you must seek medical attention immediately by calling 911 or calling your MD immediately  if symptoms less severe.  You Must read complete instructions/literature along with all the possible adverse reactions/side effects for all the Medicines you take and that have been prescribed to you. Take any new Medicines after you have completely understood and accpet all the possible adverse reactions/side effects.   Please note  You were  cared for by a hospitalist during your hospital stay. If you have any questions about your discharge medications or the care you received while you were in the hospital after you are discharged, you can call the unit and asked to speak with the hospitalist on call if the hospitalist that took care of you is not available. Once you are discharged, your primary care physician will handle any further medical issues. Please note that NO REFILLS for any discharge medications will be authorized once you are discharged, as it is imperative that you return to your primary care physician (or establish a relationship with a primary care physician if you do not have one) for your aftercare needs so that they can reassess your need for medications and monitor your lab values.     Today   No abdominal pain, nausea or vomiting.  Tolerating p.o. well.  Will discharge home today with outpatient oncology follow-up.  VITAL SIGNS:  Blood pressure 110/66, pulse 88, temperature 97.6 F (36.4 C), temperature source Oral, resp. rate 18, height 5' 8"  (1.727 m), weight  80.6 kg, SpO2 98 %.  I/O:  No intake or output data in the 24 hours ending 10/18/18 1514  PHYSICAL EXAMINATION:  GENERAL:  53 y.o.-year-old patient lying in the bed with no acute distress.  EYES: Pupils equal, round, reactive to light and accommodation. No scleral icterus. Extraocular muscles intact.  HEENT: Head atraumatic, normocephalic. Oropharynx and nasopharynx clear.  NECK:  Supple, no jugular venous distention. No thyroid enlargement, no tenderness.  LUNGS: Normal breath sounds bilaterally, no wheezing, rales,rhonchi. No use of accessory muscles of respiration.  CARDIOVASCULAR: S1, S2 normal. No murmurs, rubs, or gallops.  ABDOMEN: Soft, non-tender, non-distended. Bowel sounds present. No organomegaly or mass.  EXTREMITIES: No pedal edema, cyanosis, or clubbing.  NEUROLOGIC: Cranial nerves II through XII are intact. No focal motor or sensory defecits b/l.  PSYCHIATRIC: The patient is alert and oriented x 3.  SKIN: No obvious rash, lesion, or ulcer.   DATA REVIEW:   CBC Recent Labs  Lab 10/18/18 0534  WBC 2.9*  HGB 11.4*  HCT 36.0  PLT 81*    Chemistries  Recent Labs  Lab 10/17/18 1943 10/18/18 0534  NA 134* 135  K 4.3 4.0  CL 104 102  CO2 24 26  GLUCOSE 91 111*  BUN 18 14  CREATININE 0.64 0.70  CALCIUM 8.5* 9.0  MG 1.5* 1.9  AST 28  --   ALT 39  --   ALKPHOS 181*  --   BILITOT 0.5  --     Cardiac Enzymes No results for input(s): TROPONINI in the last 168 hours.  Microbiology Results  Results for orders placed or performed in visit on 10/15/18  Urine Culture     Status: None   Collection Time: 10/15/18  2:11 PM  Result Value Ref Range Status   Specimen Description   Final    URINE, CLEAN CATCH Performed at [facility], [address] [place], [place] [address]    Special Requests   Final    NONE Performed at [facility], [address], [place], [place] [address]    Culture   Final    NO GROWTH Performed at [facility], [place] [address] [place],  [address]    Report Status 10/16/2018 FINAL  Final    RADIOLOGY:  Ct Abdomen Pelvis W Contrast  Result Date: 10/17/2018 CLINICAL DATA:  Vaginal bleeding. Rectal bleeding. Right abdominal pain. Metastatic  cervical cancer with pulmonary metastasis. EXAM: CT ABDOMEN AND PELVIS WITH CONTRAST TECHNIQUE: Multidetector CT imaging of the abdomen and pelvis was performed using the standard protocol following bolus administration of intravenous contrast. CONTRAST:  56m ISOVUE-300 IOPAMIDOL (ISOVUE-300) INJECTION 61% COMPARISON:  09/07/2018 FINDINGS: Lower chest: Multiple pulmonary nodules are identified in both lungs compatible with metastatic disease. No acute abnormality identified. No pleural effusion. Hepatobiliary: No suspicious liver lesion. Previous cholecystectomy. Increase caliber of the common bile duct measures up to 1.6 cm. Pancreas: Unremarkable. No pancreatic ductal dilatation or surrounding inflammatory changes. Spleen: Normal in size without focal abnormality. Adrenals/Urinary Tract: The adrenal glands appear normal. No enhancing kidney mass or hydronephrosis identified. Mild diffuse bladder wall thickening with mucosal enhancement. No suspicious filling defects identified within the urinary bladder. No gas identified to suggest fistula. Stomach/Bowel: Moderate distension of the stomach. The proximal small bowel loops are abnormally dilated and measure up to 4.9 cm. Transition to decreased caliber a mid and distal small bowel loops noted in the left hemiabdomen, image 49/2 and image 67/4. Postoperative change from distal small bowel obstruction with entero colonic anastomosis is identified within the right hemiabdomen, image 80/4. Mild small bowel wall thickening just proximal to the anastomosis noted. There is mild wall thickening involving the sigmoid colon and rectum. No discrete mass identified. Vascular/Lymphatic: Aortic atherosclerosis. No aneurysm. Left  retroperitoneal lymph node measures 0.9 cm, image 31/2. Unchanged from previous exam. Reproductive: No discrete mass identified. There is increased enhancement involving the uterus and cervix. No adnexal mass identified. Other: No free fluid or fluid collections within the abdomen or pelvis. No peritoneal nodularity identified. Musculoskeletal: Scoliosis and degenerative disc disease identified. No acute or significant osseous abnormalities. IMPRESSION: 1. Exam positive for small bowel obstruction. Transition point is in the left hemiabdomen. No discrete obstructing mass noted. Findings may reflect adhesions. 2. No mass identified within the uterus or cervix or distal bowel to explain patient's vaginal and rectal bleeding. Correlation with direct visualization is advised. If further imaging is clinically indicated a contrast enhanced pelvic MRI may be helpful. 3. Bilateral pulmonary nodules compatible with metastatic disease. 4.  Aortic Atherosclerosis (ICD10-I70.0). 5. These results will be called to the ordering clinician or representative by the Radiologist Assistant, and communication documented in the PACS or zVision Dashboard. Electronically Signed   By: TKerby MoorsM.D.   On: 10/17/2018 13:26   Dg Abd 2 Views  Result Date: 10/18/2018 CLINICAL DATA:  Small bowel obstruction. EXAM: ABDOMEN - 2 VIEW COMPARISON:  Radiographs of September 07, 2018. FINDINGS: The bowel gas pattern is normal. Residual contrast is noted in the colon. Status post cholecystectomy. There is no evidence of free air. Phleboliths are noted in the pelvis. IMPRESSION: No evidence of bowel obstruction or ileus. Electronically Signed   By: JMarijo Conception M.D.   On: 10/18/2018 07:14      Management plans discussed with the patient, family and they are in agreement.  CODE STATUS:     Code Status Orders  (From admission, onward)         Start     Ordered   10/17/18 1910  Full code  Continuous     10/17/18 1910         TOTAL TIME TAKING CARE OF THIS PATIENT: 40 minutes.    SHenreitta LeberM.D on 10/18/2018 at 3:14 PM  Between 7am to 6pm - Pager - [phone]  After 6pm go to [email or web address] - pPatent [email or web address]  Office  [phone]  CC: Primary care physician; Arnetha Courser, MD

## 2018-10-18 NOTE — Consult Note (Addendum)
SURGICAL CONSULTATION NOTE (initial) - cpt: [phone]  Patient seen and examined as described below with surgical PA-C, Ardell Isaacs.  Assessment/Plan: (ICD-10's: R10.84) In summary, patient is a 53 y.o. female with unlikely or clinically resovled partial small bowel obstruction, complicated by metastatic cervical cancer with lung metastases and by comorbidities including HTN, hepatitis C, history of Right uterine and Left renal infarcts, and generalized anxiety disorder.   - clear liquids diet, advance as tolerated  - monitor abdominal exam and bowel function  - medical management of chronic lower abdominal pain, nausea, and comorbidities  - if tolerates advancement of diet with ongoing flatus and BM's, discharge planning  - no surgical intervention at this time, please call if any questions/concerns  - DVT prophylaxis, ambulation encouraged  I have personally reviewed the patient's chart, evaluated/examined the patient, proposed the recommended management, and discussed these recommendations with the patient to her expressed satisfaction as well as with patient's medical physician.  -- Marilynne Drivers Rosana Hoes, MD, [place]: [facility] - Partnering for exceptional care. Office: [phone]  [facility] SURGICAL CONSULTATION NOTE (initial) - cpt: [phone]  HISTORY OF PRESENT ILLNESS (HPI):  53 y.o. female presented to the [facility] symptom management clinic on 10/15/18 for evaluation of vaginal bleeding and chronic abdominal pain. Patient reports that she had the onset of vaginal bleeding which onset 3 days ago which was occurring intermittently. At that time, she was also reporting RLQ abdominal pain which was different from her chronic abdominal pain. She has chronic nausea and emesis. Her last bowel movement was the day of presentation and she continues to pass flatus. She does have a history of Stage IV cervical cancer with metastasis to  the lungs for which she is undergoing chemotherapy and is scheduled for her 6th cycle on 1/6 at the cancer center.   This morning, she reports that her pain in her RLQ has improved some but intermittently will worsen. She continues to tolerate a clear liquid diet without any worsening pain or changes in her chronic nausea or emesis. She does endorse having a bowel movement on day of presentation but does not feel she has had any flatus since. No additional acute complaints this afternoon. Of note, she does not feel as worse as her presentation in November of 2018 in which she required an ileocolectomy.   Surgery is consulted by hospitalist physician Dr. Darvin Neighbours in this context for evaluation and management of possible clinically improved small bowel obstruction.  PAST MEDICAL HISTORY (PMH):  Past Medical History:  Diagnosis Date  . Abnormal cervical Papanicolaou smear 09/18/2017  . Anxiety   . Aortic atherosclerosis ([place])   . Arthritis    neck and knees  . 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 Blood transfusion without reported diagnosis   . Cervical cancer ([place]) 09/2016   mets lung  . Chronic anal fissure   . Chronic diarrhea   . Dyspnea   . Erosive gastropathy 09/18/2017  . Factor V Leiden mutation ([place])   . Fecal incontinence   . Genital warts   . GERD (gastroesophageal reflux disease)   . Heart murmur   . Hemorrhoids   . 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 Hepatitis, chronic ([place]) 05/05/2017  . History of cancer chemotherapy    completed 06/2017  . History of Clostridium difficile infection    while undergoing chemo.  Negative  test 10/2017  . Ileocolic anastomotic leak   . Infarction of kidney ([place]) left kidney   and uterus  . Intestinal infection due to Clostridium difficile 09/18/2017  . Macrocytic anemia with vitamin B12 deficiency   . Nausea vomiting and diarrhea   . Pancolitis ([place]) 07/27/2018  . Perianal condylomata   .  Pneumonia    History of  . Pulmonary nodules   . Rectal bleeding   . Small bowel obstruction ([place]) 08/2017  . Stiff neck    limited right turn  . Vitamin D deficiency     PAST SURGICAL HISTORY ([place]):  Past Surgical History:  Procedure Laterality Date  . CHOLECYSTECTOMY    . COLON SURGERY  08/2017   resection  . COLONOSCOPY WITH PROPOFOL N/A 12/20/2017   Procedure: COLONOSCOPY WITH PROPOFOL;  Surgeon: [name], MD;  Location: [facility] ENDOSCOPY;  Service: Gastroenterology;  Laterality: N/A;  . COLONOSCOPY WITH PROPOFOL N/A 07/30/2018   Procedure: COLONOSCOPY WITH PROPOFOL;  Surgeon: [name], MD;  Location: [facility] ENDOSCOPY;  Service: Gastroenterology;  Laterality: N/A;  . COLONOSCOPY WITH PROPOFOL N/A 10/10/2018   Procedure: COLONOSCOPY WITH PROPOFOL;  Surgeon: Lucilla Lame, MD;  Location: [facility] ENDOSCOPY;  Service: Endoscopy;  Laterality: N/A;  . DIAGNOSTIC LAPAROSCOPY    . ESOPHAGOGASTRODUODENOSCOPY (EGD) WITH PROPOFOL N/A 12/20/2017   Procedure: ESOPHAGOGASTRODUODENOSCOPY (EGD) WITH PROPOFOL;  Surgeon: [name], MD;  Location: [facility];  Service: Gastroenterology;  Laterality: N/A;  . ESOPHAGOGASTRODUODENOSCOPY (EGD) WITH PROPOFOL  07/30/2018   Procedure: ESOPHAGOGASTRODUODENOSCOPY (EGD) WITH PROPOFOL;  Surgeon: [name], MD;  Location: [facility] ENDOSCOPY;  Service: Gastroenterology;;  . LAPAROTOMY N/A 08/31/2017   Procedure: EXPLORATORY LAPAROTOMY for SBO, ileocolectomy, removal of piece of uterine wall;  Surgeon: [name], MD;  Location: [facility] ORS;  Service: General;  Laterality: N/A;  . LASER ABLATION CONDOLAMATA N/A 02/22/2018   Procedure: LASER ABLATION/REMOVAL OF [id number] AROUND ANUS AND VAGINA;  Surgeon: Michael Boston, MD;  Location: [facility];  Service: General;  Laterality: N/A;  . OOPHORECTOMY    . PORTA CATH INSERTION N/A 05/13/2018   Procedure: PORTA CATH INSERTION;  Surgeon: Katha Cabal, MD;  Location:  [facility] CV LAB;  Service: Cardiovascular;  Laterality: N/A;  . SMALL INTESTINE SURGERY    . TANDEM RING INSERTION     x3  . THORACOTOMY    . TOTAL KNEE ARTHROPLASTY Left 04/24/2018   Procedure: TOTAL KNEE ARTHROPLASTY;  Surgeon: Lovell Sheehan, MD;  Location: [facility] ORS;  Service: Orthopedics;  Laterality: Left;    MEDICATIONS:  Prior to Admission medications   Medication Sig Start Date End Date Taking? Authorizing Provider  amitriptyline (ELAVIL) 50 MG tablet Take 1 tablet (50 mg total) by mouth at bedtime. 08/19/18 10/17/18 Yes Vanga, Tally Due, MD  budesonide (ENTOCORT EC) 3 MG 24 hr capsule Take 3 capsules (9 mg total) by mouth daily. 10/12/18  Yes Gouru, Illene Silver, MD  Calcium Carb-Cholecalciferol (CALCIUM 500 +D) 500-400 MG-UNIT TABS Take 2 tablets by mouth daily.   Yes [provider]  diphenoxylate-atropine (LOMOTIL) 2.5-0.025 MG tablet Take 2 tablets by mouth 4 (four) times daily. 10/11/18  Yes Gouru, Illene Silver, MD  HYDROcodone-acetaminophen (NORCO/VICODIN) 5-325 MG tablet Take 1 tablet by mouth every 6 (six) hours as needed for moderate pain. 10/04/18  Yes Burns, Wandra Feinstein, NP  hydrocortisone (CORTEF) 10 MG tablet Take 10 mg by mouth 3 (three) times daily.   Yes [provider]  Multiple Vitamins-Minerals (MULTIVITAMIN WITH MINERALS) tablet Take 1  tablet by mouth daily. 08/01/18 08/01/19 Yes Wieting, Richard, MD  nitrofurantoin, macrocrystal-monohydrate, (MACROBID) 100 MG capsule Take 1 capsule (100 mg total) by mouth 2 (two) times daily. For UTI 10/15/18  Yes Verlon Au, NP  ondansetron (ZOFRAN) 8 MG tablet Take 1 tablet (8 mg total) by mouth every 8 (eight) hours as needed for nausea or vomiting. 08/23/18  Yes Karen Kitchens, NP  pantoprazole (PROTONIX) 40 MG tablet Take 1 tablet (40 mg total) by mouth daily. 08/01/18  Yes Loletha Grayer, MD  promethazine (PHENERGAN) 25 MG tablet Take 1 tablet (25 mg total) by mouth every 6 (six) hours as needed for nausea or  vomiting. 10/17/18  Yes Karen Kitchens, NP  VENTOLIN HFA 108 (90 Base) MCG/ACT inhaler Inhale 1-2 puffs into the lungs every 4 (four) hours as needed for shortness of breath.    Yes [provider]  VIBERZI 100 MG TABS TAKE 1 TABLET BY MOUTH TWICE DAILY WITH  A  MEAL 10/11/18  Yes Verlon Au, NP  cyanocobalamin (,VITAMIN B-12,) 1000 MCG/ML injection Inject 1ML every week x 4 weeks, then 1ML every other week x 2 months, then monthly x 3 months Patient not taking: Reported on 10/08/2018 11/29/17   [name], MD  OLANZapine zydis (ZYPREXA) 5 MG disintegrating tablet Take 1 tablet (5 mg total) by mouth at bedtime. Patient not taking: Reported on 10/17/2018 08/01/18   Loletha Grayer, MD  phenazopyridine (PYRIDIUM) 200 MG tablet Take 1 tablet (200 mg total) by mouth 3 (three) times daily as needed for pain. Patient not taking: Reported on 10/17/2018 10/07/18   Jacquelin Hawking, NP  Syringe/Needle, Disp, (SYRINGE 3CC/22GX1") 22G X 1" 3 ML MISC For use with Vitamin B12 injections Patient not taking: Reported on 10/14/2018 04/12/18   [name], MD  traZODone (DESYREL) 50 MG tablet Take 0.5-1 tablets (25-50 mg total) by mouth at bedtime as needed for sleep. Patient not taking: Reported on 10/08/2018 08/13/18   Karen Kitchens, NP     ALLERGIES:  Allergies  Allergen Reactions  . Ketamine Anxiety and Other (See Comments)    Syncope episode/confusion      SOCIAL HISTORY:  Social History   Socioeconomic History  . Marital status: Divorced    Spouse name: Not on file  . Number of children: Not on file  . Years of education: Not on file  . Highest education level: Not on file  Occupational History  . Not on file  Social Needs  . Financial resource strain: Not hard at all  . Food insecurity:    Worry: Never true    Inability: Never true  . Transportation needs:    Medical: No    Non-medical: No  Tobacco Use  . Smoking status: Former Smoker    Last attempt to quit:  10/16/2006    Years since quitting: 12.0  . Smokeless tobacco: Never Used  Substance and Sexual Activity  . Alcohol use: Not Currently    Frequency: Never    Comment: seldom  . Drug use: Yes    Types: Marijuana  . Sexual activity: Not Currently    Birth control/protection: Post-menopausal    Comment: Not Asked  Lifestyle  . Physical activity:    Days per week: Patient refused    Minutes per session: Patient refused  . Stress: Only a little  Relationships  . Social connections:    Talks on phone: Patient refused    Gets together: Patient refused  Attends religious service: Patient refused    Active member of club or organization: Patient refused    Attends meetings of clubs or organizations: Patient refused    Relationship status: Patient refused  . Intimate partner violence:    Fear of current or ex partner: No    Emotionally abused: No    Physically abused: No    Forced sexual activity: No  Other Topics Concern  . Not on file  Social History Narrative  . Not on file     FAMILY HISTORY:  Family History  Problem Relation Age of Onset  . Hypertension Father   . Diabetes Father   . Alcohol abuse Daughter   . Hypertension Maternal Grandmother   . Diabetes Maternal Grandmother   . Diabetes Paternal Grandmother   . Hypertension Paternal Grandmother     REVIEW OF SYSTEMS:  Review of Systems  Constitutional: Positive for chills and malaise/fatigue. Negative for fever.  Respiratory: Negative for cough and sputum production.   Cardiovascular: Negative for chest pain and palpitations.  Gastrointestinal: Positive for abdominal pain, nausea and vomiting. Negative for blood in stool, constipation and diarrhea.  Genitourinary: Negative for dysuria and urgency.  Musculoskeletal: Negative for joint pain and myalgias.  Neurological: Negative for dizziness and headaches.   VITAL SIGNS:  Temp:  [97.3 F (36.3 C)-97.9 F (36.6 C)] 97.3 F (36.3 C) (01/03 0424) Pulse Rate:   [66-80] 66 (01/03 0424) Resp:  [18-20] 20 (01/03 0424) BP: (118-130)/(67-91) 130/91 (01/03 0424) SpO2:  [94 %-99 %] 94 % (01/03 0424) Weight:  [80.6 kg] 80.6 kg (01/03 0613)     Height: 5' 8"  (172.7 cm) Weight: 80.6 kg BMI (Calculated): 27.03   INTAKE/OUTPUT:  This shift: No intake/output data recorded.  Last 2 shifts: @IOLAST2SHIFTS @   PHYSICAL EXAM:  Physical Exam Vitals signs and nursing note reviewed.  Constitutional:      General: She is not in acute distress.    Appearance: Normal appearance. She is not ill-appearing.  HENT:     Head: Normocephalic and atraumatic.     Comments: Alopecia secondary to chemotherapy changes Eyes:     General: No scleral icterus.    Conjunctiva/sclera: Conjunctivae normal.  Cardiovascular:     Rate and Rhythm: Normal rate and regular rhythm.     Pulses: Normal pulses.     Heart sounds: No friction rub. No gallop.   Pulmonary:     Effort: Pulmonary effort is normal. No respiratory distress.     Breath sounds: Normal breath sounds. No wheezing or rhonchi.  Abdominal:     General: Abdomen is flat.     Tenderness: There is abdominal tenderness in the right lower quadrant and suprapubic area. There is no guarding or rebound.     Comments: Previous midline laparotomy incision, well healed  Genitourinary:    Comments: Deferred Skin:    General: Skin is warm and dry.     Coloration: Skin is not jaundiced or pale.  Neurological:     General: No focal deficit present.     Mental Status: She is alert and oriented to person, place, and time.  Psychiatric:        Mood and Affect: Mood normal.        Behavior: Behavior normal.    Labs:  CBC Latest Ref Rng & Units 10/18/2018 10/17/2018 10/14/2018  WBC 4.0 - 10.5 K/uL 2.9(L) 3.5(L) 4.5  Hemoglobin 12.0 - 15.0 g/dL 11.4(L) 10.7(L) 12.7  Hematocrit 36.0 - 46.0 % 36.0 33.8(L) 38.3  Platelets 150 - 400 K/uL 81(L) 73(L) 74(L)   CMP Latest Ref Rng & Units 10/18/2018 10/17/2018 10/14/2018  Glucose 70 - 99  mg/dL 111(H) 91 110(H)  BUN 6 - 20 mg/dL 14 18 15   Creatinine 0.44 - 1.00 mg/dL 0.70 0.64 0.67  Sodium 135 - 145 mmol/L 135 134(L) 141  Potassium 3.5 - 5.1 mmol/L 4.0 4.3 3.4(L)  Chloride 98 - 111 mmol/L 102 104 102  CO2 22 - 32 mmol/L 26 24 30   Calcium 8.9 - 10.3 mg/dL 9.0 8.5(L) 9.1  Total Protein 6.5 - 8.1 g/dL - 6.7 -  Total Bilirubin 0.3 - 1.2 mg/dL - 0.5 -  Alkaline Phos 38 - 126 U/L - 181(H) -  AST 15 - 41 U/L - 28 -  ALT 0 - 44 U/L - 39 -   Imaging studies:  Abdominal X-ray (10/18/2018) - personally reviewed with Dr. Rosana Hoes and discussed with patient: No evidence of bowel obstruction or ileus.  Assessment/Plan: (ICD-10's: R10.84) 53 y.o. female with unlikely or clinically resovled partial small bowel obstruction, complicated by comorbidities including metastatic cervical cancer with lung metastases, anxiety, factor V Leiden mutation, Hepatitis C, history of small bowel obstruction, and former tobacco abuse (smoking).   - Clear liquids (Advance as tolerated), IVF if needed  - Pain control as needed (Minimize narcotics), antiemetics  - Continue to monitor abdominal examination and on-going bowel function  - No indication for emergent surgical intervention currently.   - surgical intervention if doesn't improve was also discussed             - medical management comorbidities as per medical team             - ambulation encouraged              - DVT prophylaxis  All of the above findings and recommendations were discussed with the patient, and all of patient's family's questions were answered to her expressed satisfaction.  Thank you for the opportunity to participate in this patient's care.   -- Edison Simon, PA-C [facility] 10/18/2018, 9:23 AM [phone] M-F: 7am - 4pm

## 2018-10-18 NOTE — Telephone Encounter (Signed)
[name] was prompted to call patient due to her response she gave on an EMMI call. She answered she had been having thoughts of sadness. Patient has been admitted to 1C unit here at the hospital. [name] will see patient while she is in house. Shela Leff MSW,LCSW [phone]

## 2018-10-21 ENCOUNTER — Encounter: Payer: Self-pay | Admitting: Gastroenterology

## 2018-10-21 ENCOUNTER — Ambulatory Visit (INDEPENDENT_AMBULATORY_CARE_PROVIDER_SITE_OTHER): Payer: Medicaid Other | Admitting: Gastroenterology

## 2018-10-21 ENCOUNTER — Encounter: Payer: Self-pay | Admitting: Hematology and Oncology

## 2018-10-21 ENCOUNTER — Inpatient Hospital Stay: Payer: Medicaid Other

## 2018-10-21 ENCOUNTER — Telehealth: Payer: Self-pay

## 2018-10-21 ENCOUNTER — Inpatient Hospital Stay: Payer: Medicaid Other | Attending: Hematology and Oncology

## 2018-10-21 ENCOUNTER — Inpatient Hospital Stay (HOSPITAL_BASED_OUTPATIENT_CLINIC_OR_DEPARTMENT_OTHER): Payer: Medicaid Other | Admitting: Hematology and Oncology

## 2018-10-21 VITALS — BP 120/88 | HR 121 | Temp 97.5°F | Resp 18 | Ht 68.0 in | Wt 169.1 lb

## 2018-10-21 VITALS — BP 127/85 | HR 97 | Resp 18 | Ht 68.0 in | Wt 173.6 lb

## 2018-10-21 DIAGNOSIS — B1920 Unspecified viral hepatitis C without hepatic coma: Secondary | ICD-10-CM | POA: Insufficient documentation

## 2018-10-21 DIAGNOSIS — Z5111 Encounter for antineoplastic chemotherapy: Secondary | ICD-10-CM | POA: Diagnosis present

## 2018-10-21 DIAGNOSIS — R300 Dysuria: Secondary | ICD-10-CM | POA: Diagnosis not present

## 2018-10-21 DIAGNOSIS — Z87891 Personal history of nicotine dependence: Secondary | ICD-10-CM | POA: Diagnosis not present

## 2018-10-21 DIAGNOSIS — E538 Deficiency of other specified B group vitamins: Secondary | ICD-10-CM | POA: Diagnosis not present

## 2018-10-21 DIAGNOSIS — D6851 Activated protein C resistance: Secondary | ICD-10-CM

## 2018-10-21 DIAGNOSIS — D649 Anemia, unspecified: Secondary | ICD-10-CM

## 2018-10-21 DIAGNOSIS — R17 Unspecified jaundice: Secondary | ICD-10-CM

## 2018-10-21 DIAGNOSIS — K625 Hemorrhage of anus and rectum: Secondary | ICD-10-CM

## 2018-10-21 DIAGNOSIS — C7800 Secondary malignant neoplasm of unspecified lung: Secondary | ICD-10-CM | POA: Diagnosis not present

## 2018-10-21 DIAGNOSIS — C530 Malignant neoplasm of endocervix: Secondary | ICD-10-CM

## 2018-10-21 DIAGNOSIS — Z7189 Other specified counseling: Secondary | ICD-10-CM

## 2018-10-21 DIAGNOSIS — E871 Hypo-osmolality and hyponatremia: Secondary | ICD-10-CM | POA: Diagnosis not present

## 2018-10-21 DIAGNOSIS — K648 Other hemorrhoids: Secondary | ICD-10-CM

## 2018-10-21 DIAGNOSIS — K529 Noninfective gastroenteritis and colitis, unspecified: Secondary | ICD-10-CM | POA: Diagnosis not present

## 2018-10-21 DIAGNOSIS — E860 Dehydration: Secondary | ICD-10-CM | POA: Diagnosis not present

## 2018-10-21 DIAGNOSIS — R000 Tachycardia, unspecified: Secondary | ICD-10-CM

## 2018-10-21 DIAGNOSIS — D696 Thrombocytopenia, unspecified: Secondary | ICD-10-CM

## 2018-10-21 DIAGNOSIS — R748 Abnormal levels of other serum enzymes: Secondary | ICD-10-CM | POA: Insufficient documentation

## 2018-10-21 DIAGNOSIS — A0472 Enterocolitis due to Clostridium difficile, not specified as recurrent: Secondary | ICD-10-CM | POA: Insufficient documentation

## 2018-10-21 DIAGNOSIS — R197 Diarrhea, unspecified: Secondary | ICD-10-CM

## 2018-10-21 DIAGNOSIS — Z923 Personal history of irradiation: Secondary | ICD-10-CM | POA: Diagnosis not present

## 2018-10-21 DIAGNOSIS — C538 Malignant neoplasm of overlapping sites of cervix uteri: Secondary | ICD-10-CM

## 2018-10-21 DIAGNOSIS — I951 Orthostatic hypotension: Secondary | ICD-10-CM

## 2018-10-21 DIAGNOSIS — D61818 Other pancytopenia: Secondary | ICD-10-CM | POA: Diagnosis not present

## 2018-10-21 DIAGNOSIS — E876 Hypokalemia: Secondary | ICD-10-CM

## 2018-10-21 DIAGNOSIS — E2740 Unspecified adrenocortical insufficiency: Secondary | ICD-10-CM | POA: Diagnosis not present

## 2018-10-21 DIAGNOSIS — R1031 Right lower quadrant pain: Secondary | ICD-10-CM

## 2018-10-21 DIAGNOSIS — Z96652 Presence of left artificial knee joint: Secondary | ICD-10-CM | POA: Insufficient documentation

## 2018-10-21 DIAGNOSIS — Z79899 Other long term (current) drug therapy: Secondary | ICD-10-CM | POA: Diagnosis not present

## 2018-10-21 DIAGNOSIS — R1030 Lower abdominal pain, unspecified: Secondary | ICD-10-CM

## 2018-10-21 DIAGNOSIS — Z95828 Presence of other vascular implants and grafts: Secondary | ICD-10-CM

## 2018-10-21 LAB — COMPREHENSIVE METABOLIC PANEL
ALT: 67 U/L — ABNORMAL HIGH (ref 0–44)
AST: 48 U/L — ABNORMAL HIGH (ref 15–41)
Albumin: 4.4 g/dL (ref 3.5–5.0)
Alkaline Phosphatase: 185 U/L — ABNORMAL HIGH (ref 38–126)
Anion gap: 12 (ref 5–15)
BUN: 17 mg/dL (ref 6–20)
CO2: 24 mmol/L (ref 22–32)
Calcium: 9.6 mg/dL (ref 8.9–10.3)
Chloride: 98 mmol/L (ref 98–111)
Creatinine, Ser: 0.91 mg/dL (ref 0.44–1.00)
GFR calc Af Amer: >60 mL/min (ref >60)
GFR calc non Af Amer: >60 mL/min (ref >60)
Glucose, Bld: 111 mg/dL — ABNORMAL HIGH (ref 70–99)
Potassium: 3.9 mmol/L (ref 3.5–5.1)
Sodium: 134 mmol/L — ABNORMAL LOW (ref 135–145)
Total Bilirubin: 1.4 mg/dL — ABNORMAL HIGH (ref 0.3–1.2)
Total Protein: 8.6 g/dL — ABNORMAL HIGH (ref 6.5–8.1)

## 2018-10-21 LAB — CBC WITH DIFFERENTIAL/PLATELET
Abs Immature Granulocytes: 0.02 10*3/uL (ref 0.00–0.07)
Basophils Absolute: 0 10*3/uL (ref 0.0–0.1)
Basophils Relative: 0 %
Eosinophils Absolute: 0.1 10*3/uL (ref 0.0–0.5)
Eosinophils Relative: 1 %
HCT: 39.1 % (ref 36.0–46.0)
Hemoglobin: 13.2 g/dL (ref 12.0–15.0)
Immature Granulocytes: 0 %
Lymphocytes Relative: 17 %
Lymphs Abs: 0.9 10*3/uL (ref 0.7–4.0)
MCH: 33.8 pg (ref 26.0–34.0)
MCHC: 33.8 g/dL (ref 30.0–36.0)
MCV: 100 fL (ref 80.0–100.0)
Monocytes Absolute: 0.4 10*3/uL (ref 0.1–1.0)
Monocytes Relative: 8 %
Neutro Abs: 3.7 10*3/uL (ref 1.7–7.7)
Neutrophils Relative %: 74 %
Platelets: 95 10*3/uL — ABNORMAL LOW (ref 150–400)
RBC: 3.91 MIL/uL (ref 3.87–5.11)
RDW: 14.6 % (ref 11.5–15.5)
WBC: 5 10*3/uL (ref 4.0–10.5)
nRBC: 0 % (ref 0.0–0.2)

## 2018-10-21 LAB — VITAMIN B12: Vitamin B-12: 2793 pg/mL — ABNORMAL HIGH (ref 180–914)

## 2018-10-21 LAB — MAGNESIUM: Magnesium: 1.5 mg/dL — ABNORMAL LOW (ref 1.7–2.4)

## 2018-10-21 LAB — BILIRUBIN, DIRECT: Bilirubin, Direct: 0.3 mg/dL — ABNORMAL HIGH (ref 0.0–0.2)

## 2018-10-21 LAB — FOLATE: Folate: 11.5 ng/mL (ref >5.9)

## 2018-10-21 MED ORDER — OCTREOTIDE ACETATE 30 MG IM KIT: 30.0000 mg | PACK | INTRAMUSCULAR | 0 refills | Status: DC

## 2018-10-21 MED ORDER — OXYCODONE HCL 5 MG PO TABS: 5.0000 mg | ORAL_TABLET | Freq: Four times a day (QID) | ORAL | 0 refills | Status: DC | PRN

## 2018-10-21 MED ORDER — HEPARIN SOD (PORK) LOCK FLUSH 100 UNIT/ML IV SOLN
500.0000 [IU] | Freq: Once | INTRAVENOUS | Status: AC
  Administered 2018-10-21: 500 [IU] via INTRAVENOUS
  Filled 2018-10-21: qty 5

## 2018-10-21 MED ORDER — RIFAXIMIN 550 MG PO TABS: 550.0000 mg | ORAL_TABLET | Freq: Two times a day (BID) | ORAL | 0 refills | Status: AC

## 2018-10-21 MED ORDER — SODIUM CHLORIDE 0.9% FLUSH
10.0000 mL | INTRAVENOUS | Status: DC | PRN
  Administered 2018-10-21: 10 mL via INTRAVENOUS
  Filled 2018-10-21: qty 10

## 2018-10-21 MED ORDER — MORPHINE SULFATE 2 MG/ML IJ SOLN
2.0000 mg | Freq: Once | INTRAMUSCULAR | Status: AC
  Administered 2018-10-21: 2 mg via INTRAVENOUS
  Filled 2018-10-21: qty 1

## 2018-10-21 MED ORDER — HYOSCYAMINE SULFATE ER 0.375 MG PO TB12: 0.3750 mg | ORAL_TABLET | Freq: Two times a day (BID) | ORAL | 0 refills | Status: DC

## 2018-10-21 MED ORDER — SODIUM CHLORIDE 0.9 % IV SOLN
Freq: Once | INTRAVENOUS | Status: AC
  Administered 2018-10-21: 10:00:00 via INTRAVENOUS
  Filled 2018-10-21: qty 1000

## 2018-10-21 NOTE — Progress Notes (Signed)
[place] day:  10/21/2018   Chief Complaint: Joanna Hall is a 53 y.o. female with stage IV cervical cancer who is seen for hospital follow up visit on day 22 following cycle #5 carboplatin + paclitaxel.   HPI:  The patient was last seen in the medical oncology clinic on 10/14/2018 by Honor Loh, NP.  At that time, she was day 15 s/p cycle #5 carboplatin and Taxol.    She was admitted to [facility] from 10/08/2018 - 10/11/2018 with GI bleeding.  Colonoscopy on 10/10/2018,revealed 3 ulcers in the terminal ileum. Pathology results revealed no evidence of dysplasia or malignancy. Xarelto was held secondary to bleeding and thrombocytopenia.  She received 2 units of PRBCs.  She was seen by Beckey Rutter, NP on 10/15/2018.  There was possible ulceration in the vagina.  She had dysuria.  UA was c/w UTI.  She was started on Macrobid. CT scan ordered.  She was admitted from 10/17/2018 - 10/18/2018 at [facility] with a small bowel obstruction.  She presented with abdominal pain, nausea and vomiting.  Abdomen and pelvis CT suggested a partial small bowel obstruction.  She was seen by general surgery and had no evidence of bowel obstruction clinically as she had a bowel movement was passing flatus and had a benign abdomen.  During the interim, patient is "not doing too well today". Patient notes that she is "really tired". Patient continues to have diarrhea despite prescribed interventions. Stools start off formed in the mornings and progress to "straight water" by the end of the day. Patient has to have a change of clothing and incontinence products with her at all times due to frequent episodes of vowel incontinence.   She continues to experience nausea. No fevers. She complains of lower abdominal pain, mainly in the RIGHT lower quadrant. Patient denies any appreciable GI bleeding; no hematochezia or melena. She continues to have vaginal bleeding, quantifying the amount  as being "only when she wipes". She remains off of her anticoagulation therapy.   Patient has vertigo symptoms associated with position changes.  She presents to the clinic today TACHYcardic to the 120s. She complains of increased joint pains as of late. She notes known BILATERAL cuff arthropathy, however due to oncology diagnosis, she has elected to put interventions "on the back burner".   Patient advises that she maintains an adequate appetite. She is eating well. Weight today is 169 lb 1.5 oz (76.7 kg), which compared to her last visit to the clinic, represents an 8 pound decrease.    Patient complains of pain rated 8/10 in the clinic today.   Past Medical History:  Diagnosis Date  . Abnormal cervical Papanicolaou smear 09/18/2017  . Anxiety   . Aortic atherosclerosis ([place])   . Arthritis    neck and knees  . 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 Blood transfusion without reported diagnosis   . Cervical cancer ([place]) 09/2016   mets lung  . Chronic anal fissure   . Chronic diarrhea   . Dyspnea   . Erosive gastropathy 09/18/2017  . Factor V Leiden mutation ([place])   . Fecal incontinence   . Genital warts   . GERD (gastroesophageal reflux disease)   . Heart murmur   . Hemorrhoids   . 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 Hepatitis, chronic ([place]) 05/05/2017  . History of cancer chemotherapy  completed 06/2017  . History of Clostridium difficile infection    while undergoing chemo.  Negative test 10/2017  . Ileocolic anastomotic leak   . 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 Intestinal infection due to Clostridium difficile 09/18/2017  . Macrocytic anemia with vitamin B12 deficiency   . Nausea vomiting and diarrhea   . Pancolitis ([place]) 07/27/2018  . Perianal condylomata   . Pneumonia    History of  . Pulmonary nodules   . Rectal bleeding   . Small bowel obstruction ([place]) 08/2017  . Stiff neck    limited right  turn  . Vitamin D deficiency     Past Surgical History:  Procedure Laterality Date  . CHOLECYSTECTOMY    . COLON SURGERY  08/2017   resection  . COLONOSCOPY WITH PROPOFOL N/A 12/20/2017   Procedure: COLONOSCOPY WITH PROPOFOL;  Surgeon: [name], MD;  Location: [facility] ENDOSCOPY;  Service: Gastroenterology;  Laterality: N/A;  . COLONOSCOPY WITH PROPOFOL N/A 07/30/2018   Procedure: COLONOSCOPY WITH PROPOFOL;  Surgeon: [name], MD;  Location: [facility] ENDOSCOPY;  Service: Gastroenterology;  Laterality: N/A;  . COLONOSCOPY WITH PROPOFOL N/A 10/10/2018   Procedure: COLONOSCOPY WITH PROPOFOL;  Surgeon: Lucilla Lame, MD;  Location: [facility] ENDOSCOPY;  Service: Endoscopy;  Laterality: N/A;  . DIAGNOSTIC LAPAROSCOPY    . ESOPHAGOGASTRODUODENOSCOPY (EGD) WITH PROPOFOL N/A 12/20/2017   Procedure: ESOPHAGOGASTRODUODENOSCOPY (EGD) WITH PROPOFOL;  Surgeon: [name], MD;  Location: [facility];  Service: Gastroenterology;  Laterality: N/A;  . ESOPHAGOGASTRODUODENOSCOPY (EGD) WITH PROPOFOL  07/30/2018   Procedure: ESOPHAGOGASTRODUODENOSCOPY (EGD) WITH PROPOFOL;  Surgeon: [name], MD;  Location: [facility] ENDOSCOPY;  Service: Gastroenterology;;  . LAPAROTOMY N/A 08/31/2017   Procedure: EXPLORATORY LAPAROTOMY for SBO, ileocolectomy, removal of piece of uterine wall;  Surgeon: [name], MD;  Location: [facility] ORS;  Service: General;  Laterality: N/A;  . LASER ABLATION CONDOLAMATA N/A 02/22/2018   Procedure: LASER ABLATION/REMOVAL OF MMNOTRRNHAF AROUND ANUS AND VAGINA;  Surgeon: Michael Boston, MD;  Location: [place];  Service: General;  Laterality: N/A;  . OOPHORECTOMY    . PORTA CATH INSERTION N/A 05/13/2018   Procedure: PORTA CATH INSERTION;  Surgeon: Katha Cabal, MD;  Location: [place] CV LAB;  Service: Cardiovascular;  Laterality: N/A;  . SMALL INTESTINE SURGERY    . TANDEM RING INSERTION     x3  . THORACOTOMY    . TOTAL KNEE ARTHROPLASTY  Left 04/24/2018   Procedure: TOTAL KNEE ARTHROPLASTY;  Surgeon: Lovell Sheehan, MD;  Location: [facility] ORS;  Service: Orthopedics;  Laterality: Left;    Family History  Problem Relation Age of Onset  . Hypertension Father   . Diabetes Father   . Alcohol abuse Daughter   . Hypertension Maternal Grandmother   . Diabetes Maternal Grandmother   . Diabetes Paternal Grandmother   . Hypertension Paternal Grandmother     Social History:  reports that she quit smoking about 12 years ago. She has never used smokeless tobacco. She reports previous alcohol use. She reports current drug use. Drug: Marijuana.  The patient's 9 year old daughter died of alcoholism.  The patient is originally from [place].  She moved from [place] to [place]. She is alone today.   Allergies:  Allergies  Allergen Reactions  . Ketamine Anxiety and Other (See Comments)    Syncope episode/confusion     Current Medications: Current Outpatient Medications  Medication Sig Dispense Refill  . amitriptyline (ELAVIL)  50 MG tablet Take 1 tablet (50 mg total) by mouth at bedtime. 30 tablet 2  . budesonide (ENTOCORT EC) 3 MG 24 hr capsule Take 3 capsules (9 mg total) by mouth daily. 30 capsule 0  . Calcium Carb-Cholecalciferol (CALCIUM 500 +D) 500-400 MG-UNIT TABS Take 2 tablets by mouth daily.    . diphenoxylate-atropine (LOMOTIL) 2.5-0.025 MG tablet Take 2 tablets by mouth 4 (four) times daily. 30 tablet 0  . HYDROcodone-acetaminophen (NORCO/VICODIN) 5-325 MG tablet Take 1 tablet by mouth every 6 (six) hours as needed for moderate pain. 60 tablet 0  . hydrocortisone (CORTEF) 10 MG tablet Take 10 mg by mouth 3 (three) times daily.    . Multiple Vitamins-Minerals (MULTIVITAMIN WITH MINERALS) tablet Take 1 tablet by mouth daily. 30 tablet 0  . nitrofurantoin, macrocrystal-monohydrate, (MACROBID) 100 MG capsule Take 1 capsule (100 mg total) by mouth 2 (two) times daily. For UTI 10 capsule 0  . pantoprazole  (PROTONIX) 40 MG tablet Take 1 tablet (40 mg total) by mouth daily. 30 tablet 0  . VIBERZI 100 MG TABS TAKE 1 TABLET BY MOUTH TWICE DAILY WITH  A  MEAL 60 tablet 0  . ondansetron (ZOFRAN) 8 MG tablet Take 1 tablet (8 mg total) by mouth every 8 (eight) hours as needed for nausea or vomiting. (Patient not taking: Reported on 10/21/2018) 30 tablet 3  . promethazine (PHENERGAN) 25 MG tablet Take 1 tablet (25 mg total) by mouth every 6 (six) hours as needed for nausea or vomiting. (Patient not taking: Reported on 10/21/2018) 30 tablet 0  . VENTOLIN HFA 108 (90 Base) MCG/ACT inhaler Inhale 1-2 puffs into the lungs every 4 (four) hours as needed for shortness of breath.   0   No current facility-administered medications for this visit.    Facility-Administered Medications Ordered in Other Visits  Medication Dose Route Frequency Provider Last Rate Last Dose  . heparin lock flush 100 unit/mL  500 Units Intravenous Once Corcoran, Melissa C, MD      . heparin lock flush 100 unit/mL  500 Units Intravenous Once Corcoran, Melissa C, MD      . sodium chloride 0.9 % 1,000 mL with potassium chloride 20 mEq infusion   Intravenous Once Honor Loh E, NP      . sodium chloride flush (NS) 0.9 % injection 10 mL  10 mL Intravenous PRN Lequita Asal, MD         Review of Systems  Constitutional: Positive for malaise/fatigue and weight loss (8 pounds). Negative for chills, diaphoresis and fever.       Feels "not well".  "Really tired".  HENT: Negative.  Negative for congestion, ear discharge, ear pain, nosebleeds, sinus pain and sore throat.   Eyes: Negative.  Negative for blurred vision, double vision, photophobia and pain.  Respiratory: Positive for shortness of breath (exertional). Negative for cough, hemoptysis, sputum production and wheezing.   Cardiovascular: Negative.  Negative for chest pain, palpitations, orthopnea, leg swelling and PND.  Gastrointestinal: Positive for abdominal pain (lower), diarrhea  (ongoing- see HPI) and nausea. Negative for blood in stool, constipation, melena and vomiting.       Stool incontinence at times.  Genitourinary: Negative for dysuria, frequency, hematuria and urgency.       Wipe and see blood (unknown if vaginal bleeding).  Musculoskeletal: Negative.  Negative for falls, joint pain, myalgias and neck pain.  Skin: Negative.  Negative for itching and rash.  Neurological: Positive for dizziness (vertigo with position changes) and weakness (  generalized). Negative for tingling, tremors, sensory change, speech change, focal weakness and headaches.  Endo/Heme/Allergies: Negative.  Does not bruise/bleed easily.  Psychiatric/Behavioral: Negative for memory loss. The patient is not nervous/anxious and does not have insomnia.   All other systems reviewed and are negative.  Performance status (ECOG): 2  Vital Signs BP 120/88 (BP Location: Right Arm, Patient Position: Sitting)   Pulse (!) 121   Temp (!) 97.5 F (36.4 C) (Tympanic)   Resp 18   Ht 5' 8" (1.727 m)   Wt 169 lb 1.5 oz (76.7 kg)   SpO2 98%   BMI 25.71 kg/m   Physical Exam  Constitutional: She is oriented to person, place, and time. Vital signs are normal.  Non-toxic appearance. No distress.  Fatigued appearing woman sitting in the exam room in no acute distress.  HENT:  Head: Normocephalic and atraumatic. Hair is normal (chemotherapy induced alopecia).  Mouth/Throat: Oropharynx is clear and moist and mucous membranes are normal. No oropharyngeal exudate.  Wearing a pink wrap.  Eyes: Pupils are equal, round, and reactive to light. Conjunctivae and EOM are normal. No scleral icterus.  Neck: Normal range of motion. Neck supple. No JVD present.  Cardiovascular: Normal rate, regular rhythm, normal heart sounds and intact distal pulses. Exam reveals no gallop and no friction rub.  No murmur heard. Pulmonary/Chest: Effort normal and breath sounds normal. No respiratory distress. She has no wheezes. She  has no rales.  PAC; accessed; no signs of infection  Abdominal: Soft. She exhibits no distension and no mass. There is abdominal tenderness (slight) in the right lower quadrant, suprapubic area and left lower quadrant. There is no rebound and no guarding.  Musculoskeletal: Normal range of motion.        General: No tenderness or edema.  Lymphadenopathy:    She has no cervical adenopathy.    She has no axillary adenopathy.       Right: No inguinal and no supraclavicular adenopathy present.       Left: No inguinal and no supraclavicular adenopathy present.  Neurological: She is alert and oriented to person, place, and time. Gait normal.  Skin: Skin is warm and dry. No rash noted. She is not diaphoretic. No erythema.  Psychiatric: Mood, affect and judgment normal.  Nursing note and vitals reviewed.   Imaging studies: 06/2017:  PET scan revealed enlarging pulmonary nodules but no evidence of abdominal disease per patient report. 09/13/2017:  Abdomen and pelvic CT revealed a small infarct in the inferior pole of the LEFT kidney and RIGHT uterine infarct. 11/08/2017:  PET scan at the [place] [facility] revealed innumerable stable bilateral cavitary pulmonary nodules (up to 8 mm), some of which demonstrate low level metabolic activity.  There were post treatment changes in the pelvis without evidence of suspicious hypermetabolic activity. 12/03/2017:  Abdomen and pelvic CT revealed continued multiple pulmonary nodules in the lung bases concerning for metastatic disease.  There was mild wall thickening of rectosigmoid colon is noted.  There was a stable 1.8 x 1.6 cm mixed fat and soft tissue density noted in right lower quadrant which may represent fat necrosis.  There was a larger low density is noted involving uterine fundus.  12/03/2017:  Pelvic MRI was unremarkable.   04/15/2018:  Chest, abdomen, and pelvis CT revealed innumerable (> 100) cavitary nodules scattered in the lungs,  moderately enlarging compared to the 11/08/2017 PET-CT, suspicious for metastatic disease.  One index node in the RLL measures 1.0 x 1.1 cm (  previously 0.6 x 0.6 cm).  There were no new nodules.  There was an ill-defined wall thickening in the rectosigmoid with surrounding stranding along fascia planes, probably sequela from prior radiation therapy.  There was multilevel lumbar impingement due to spondylosis and degenerative disc disease.  There was heterogeneous enhancement in the uterus (some possibly from prior radiation therapy). 04/23/2018:  PET scan revealed numerous scattered solid and cavitary nodules in the lungs stable increased in size compared to the prior PET-CT from 11/08/2017. Largest nodule was 1.1 cm in the LUL (SUV 1.9).  These demonstrated low-grade metabolic activity up to a maximum SUV of about 2.3, increased from 11/08/2017.  05/23/2018:  Abdomen and pelvic CT revealed colonic wall thickening, consistent with colitis. Considerations include inflammatory or infectious causes. Ischemic causes would be less likely. She is s/p RIGHT hemicolectomy. She had a patent ileocolic anastomosis.  She received a 10 day course of ciprofloxacin and Flagyl.   09/07/2018:  Abdomen and pelvic CT angiogram revealed colitis, mainly affecting lower loops, question radiation colitis if there has been radiotherapy of the patient's cervical cancer.  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  09/30/2018:  Chest CT revealed interval decrease in size and number of metastatic pulmonary nodules.  There were no new/progressive findings in the chest or upper abdomen   Appointment on 10/21/2018  Component Date Value Ref Range Status  . Magnesium 10/21/2018 1.5* 1.7 - 2.4 mg/dL Final   Performed at [facility], [address] [place], [place] [address]  . Sodium 10/21/2018 134* 135 - 145 mmol/L Final  . Potassium 10/21/2018 3.9  3.5 - 5.1 mmol/L Final  . Chloride 10/21/2018 98  98 - 111 mmol/L Final  .  CO2 10/21/2018 24  22 - 32 mmol/L Final  . Glucose, Bld 10/21/2018 111* 70 - 99 mg/dL Final  . BUN 10/21/2018 17  6 - 20 mg/dL Final  . Creatinine, Ser 10/21/2018 0.91  0.44 - 1.00 mg/dL Final  . Calcium 10/21/2018 9.6  8.9 - 10.3 mg/dL Final  . Total Protein 10/21/2018 8.6* 6.5 - 8.1 g/dL Final  . Albumin 10/21/2018 4.4  3.5 - 5.0 g/dL Final  . AST 10/21/2018 48* 15 - 41 U/L Final  . ALT 10/21/2018 67* 0 - 44 U/L Final  . Alkaline Phosphatase 10/21/2018 185* 38 - 126 U/L Final  . Total Bilirubin 10/21/2018 1.4* 0.3 - 1.2 mg/dL Final  . GFR calc non Af Amer 10/21/2018 >60  >60 mL/min Final  . GFR calc Af Amer 10/21/2018 >60  >60 mL/min Final  . Anion gap 10/21/2018 12  5 - 15 Final   Performed at [facility], [address], [place], [place] [address]  . WBC 10/21/2018 5.0  4.0 - 10.5 K/uL Final  . RBC 10/21/2018 3.91  3.87 - 5.11 MIL/uL Final  . Hemoglobin 10/21/2018 13.2  12.0 - 15.0 g/dL Final  . HCT 10/21/2018 39.1  36.0 - 46.0 % Final  . MCV 10/21/2018 100.0  80.0 - 100.0 fL Final  . MCH 10/21/2018 33.8  26.0 - 34.0 pg Final  . MCHC 10/21/2018 33.8  30.0 - 36.0 g/dL Final  . RDW 10/21/2018 14.6  11.5 - 15.5 % Final  . Platelets 10/21/2018 95* 150 - 400 K/uL Final   Comment: Immature Platelet Fraction may be clinically indicated, consider ordering this additional test [id number]   . nRBC 10/21/2018 0.0  0.0 - 0.2 % Final  . Neutrophils Relative % 10/21/2018 74  % Final  .  Neutro Abs 10/21/2018 3.7  1.7 - 7.7 K/uL Final  . Lymphocytes Relative 10/21/2018 17  % Final  . Lymphs Abs 10/21/2018 0.9  0.7 - 4.0 K/uL Final  . Monocytes Relative 10/21/2018 8  % Final  . Monocytes Absolute 10/21/2018 0.4  0.1 - 1.0 K/uL Final  . Eosinophils Relative 10/21/2018 1  % Final  . Eosinophils Absolute 10/21/2018 0.1  0.0 - 0.5 K/uL Final  . Basophils Relative 10/21/2018 0  % Final  . Basophils Absolute 10/21/2018 0.0  0.0 - 0.1 K/uL Final  . Immature Granulocytes 10/21/2018 0   % Final  . Abs Immature Granulocytes 10/21/2018 0.02  0.00 - 0.07 K/uL Final   Performed at [facility], [address], [place], [place] [address]    Assessment:  NEL STONEKING is a 54 y.o. female with a history of stage IB1 cervical cancer s/p concurrent cisplatin and radiation (external beam and brachytherapy) from 11/2016 - 02/2017.    She was treated by Dr. Christene Slates at [facility] in [place], [place].  In 01/2017, she received cisplatin x 2 and carboplatin x 1 (01/29/2017) due to ARF and XRT. Radiation was followed by T & O on 02/01/2017 and T & N on 02/10/2017 and 02/20/2017.  Course was complicated by weight loss (80 pounds), nausea, vomiting, electrolyte wasting (potassium and magnesium), and multiple hospitalizations.  PET scan in 06/2017 revealed enlarging pulmonary nodules but no evidence of abdominal disease per patient report.  She is s/p right ileocolectomy on 08/31/2017 for a small bowel obstruction.  She presented with abdominal pain.  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 PDL-1 testing reveals a CPS score of 15.  Chest, abdomen, and pelvis CT on 04/15/2018 revealed innumerable (> 100) cavitary nodules scattered in the lungs, moderately enlarging compared to the 11/08/2017 PET-CT, suspicious for metastatic disease. One index node in the RLL measures 1.0 x 1.1 cm.  There were no new nodules.  There was an ill-defined wall thickening in the rectosigmoid with surrounding stranding along fascia planes, probably sequela from prior radiation therapy.  There was multilevel lumbar impingement due to spondylosis and degenerative disc disease.  There was heterogeneous enhancement in the uterus (some possibly from prior radiation therapy).  PET scan on 04/23/2018 revealed numerous scattered solid and  cavitary nodules in the lungs stable increased in size compared to the prior PET-CT from 11/08/2017. Largest nodule was 1.1 cm in the LUL (SUV 1.9).  These demonstrated low-grade metabolic activity up to a maximum SUV of about 2.3, increased from 11/08/2017.   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  She is day 22 s/p cycle # 5 cycle of carboplatin and Taxol (06/04/2018 - 09/30/2018) with Margarette Canada support.    Chest CT on 09/30/2018 revealed interval decrease in size and number of metastatic pulmonary nodules.  There were no new/progressive findings in the chest or upper abdomen  Hypercoagulable work-up on 09/14/2017 revealed heterozygosity for Factor V Leiden (single [id number] mutation).  Testing was negative for prothrombin gene mutation, lupus anticoagulant panel, anticardiolipin antibodies, and beta-2 glycoprotein antibodies.  She was discharged on Xarelto.  She was admitted to [facility] from 12/03/2017 - 12/08/2017 with abdominal pain. She was not felt to have a uterine infarct.  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  The etiology was unclear.  She was admitted to [facility] from 10/08/2018 - 10/11/2018 for rectal bleeding. Hemoglobin dropped from 13 to 8.9 in 4 days. Platelets had decreased to 81,000; chronic anticoagulation (Xarelto) was held. She received 2 units of PRBCs. Repeat stool studies were negative for diarrhea of infectious etiology. Colonoscopy on 10/10/2018 revealed 3 ulcers in the terminal ileum (no evidence of dysplasia or malignancy). Throughout the course of her admission, platelet count was monitored (dropped as low as 61,000).   She was admitted to [facility] from 10/17/2018 - 10/18/2018 at [facility] abdominal pain, nausea and vomiting.  Abdomen and pelvis CT suggested a partial small bowel obstruction.  She was seen by general surgery and had no evidence of bowel obstruction  clinically as she had a bowel movement was passing flatus and had a benign abdomen.  She is hepatitis C positive.   Hepatitis C genotype is 2a/2c.  She has B12 deficiency.  B12 was 165 on 11/27/2017.  She began B12 injections on 11/29/2017.  B12 injections occur monthly at home.  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  She was diagnosed with colitis.  Abdomen and pelvic CT on 05/23/2018 revealed colonic wall thickening, consistent with colitis. Considerations include inflammatory or infectious causes. Ischemic causes would be less likely. She is s/p RIGHT hemicolectomy. She had a patent ileocolic anastomosis.  She received a 10 day course of ciprofloxacin and Flagyl.  Abdomen and pelvic CT angiogram on 09/07/2018 revealed colitis, mainly affecting lower loops, question radiation colitis if there has been radiotherapy of the patient's cervical cancer.  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   She has  chronic diarrhea s/p right hemicolectomy.  She is on colestipol, budesonide, amitriptyline, and Viberzi.  She has adrenal insufficiency.  She is on hydrocortisone.  Symptomatically, she continues to have ongoing issues with diarrhea.  She feels poorly.  Exam reveals lower abdominal tenderness without guarding or rebound.  Magnesium is 1.5.  Bilirubin is 1.4.  Platelet count is 95,000.  Plan: 1. Labs today: CBC with diff, CMP, Mg 2. Review interval admission 3. Metastatic cervical cancer Day 22 s/p last cycle of carboplatin + paclitaxel with Neulasta support. Postpone chemotherapy secondary to ongoing GI issues. 4. Anemia and thrombocytopenia Labs reviewed. Hematocrit 39.1 and hemoglobin 13.2. Platelets 95,000. Rivaroxaban on hold.  5. Vaginal bleeding  Patient unsure if she is having any vaginal bleeding.  Bleeding with wiping.  Follow-up with GYN-ONC. 6. History of rectal bleeding Recent colonoscopy revealed 3 ulcers noted at the terminal ileum.  Pathology was negative  for high grade dysplasia or malignancy.  Concern for possible ulcerative colitis or Crohn's disease. Follow-up with GI today. 7. Nausea and diarrhea Ongoing without explanation. Patient on a multi-drug management (colestipol, budesonide, amitriptyline, Viberzi) for diarrhea, which is only intermittently effective.  Phone follow-up with GI today. She will be seen by Dr Marius Ditch today 8. Electrolyte derangements Labs reviewed. BUN 17 and creatinine 0.91.  Potassium 3.9.  Magnesium 1.5. 1 liter NS + 3 gm IV magnesium.  9. Tachycardia  Etiology likely secondary to pain and dehydration.  EKG today. 10.   Pain and toxicology  Change pain medications to oxycodone 5 mg po q 6 hours prn pain (dis:#30). 11.   RTC in 1 week for MD assessment, labs (CBC with diff, CMP, Mg), and discussion regarding direction of therapy.   Honor Loh, NP  10/21/2018, 9:19 AM   I saw and evaluated the patient, participating in the key portions of  the service and reviewing pertinent diagnostic studies and records.  I reviewed the nurse practitioner's note and agree with the findings and the plan.  The assessment and plan were discussed with the patient. Multiple questions were asked by the patient and answered.   Nolon Stalls, MD 10/21/2018, 9:19 AM

## 2018-10-21 NOTE — Telephone Encounter (Signed)
Received request from [facility] Pearline Cables and Dr. Mike Gip to have Joanna Hall evaluated this Wednesday, 10/23/18, for continuing vaginal bleeding. Added to schedule 1/8 at 0830. She is currently in the [facility] clinic and they will notify her with appointment details.

## 2018-10-21 NOTE — Progress Notes (Signed)
[name], MD [address]  [place],  [address]  Main: [phone]  Fax: [phone]    Gastroenterology Consultation  Referring Provider:     Arnetha Courser, MD Primary Care Physician:  Arnetha Courser, MD Primary Gastroenterologist:  Dr. [name] Reason for Consultation:     Chronic diarrhea        HPI:   Joanna Hall is a 53 y.o. female referred by Dr. Hampton Abbot for consultation & management of and chronic unexplained diarrhea. Patient has history of cervical cancer underwent chemotherapy XRT and brachytherapy, appears to be stable, closely followed by oncology at [facility]. She had small bowel obstruction in 08/2017, underwent exploratory laparoscopy with ileocolectomy and primary anastomosis. 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 Currently on Xarelto  Patient reports that since her bowel resection, she has been experiencing several episodes of nonbloody diarrhea, up to 12 times per day and night associated with urgency, nocturnal diarrhea as well as fecal incontinence. She does report diffuse lower abdominal cramps as well as intermittent gas/bloating.patient reports that she may have seen worms in her stools on several occasions. She does drink well water. She denies recent travel outside the [place] or recent use of antibiotics. She did have C. Difficile infection in the past when she was undergoing chemotherapy for cervical cancer. She had stool studies for C. Difficile and other GI pathogens including ova and parasites in 10/2017 by Dr. Hampton Abbot and they were negative.She has tried Imodium and Lomotil with no relief. Therefore she was referred to GI by Dr. Hampton Abbot for further management. Patient denies any particular relation to food. Prior to her surgery, patient reports having alternating episodes of  diarrhea and constipation associated with abdominal cramps and bloating. She would have up to 2-3 soft bowel movements daily but not this severe.her weight has been stable. She denies nausea, vomiting, fever, night sweats. She denies rectal bleeding. She is found to have macrocytic anemia which has been stable. She is not on any B12 or iron supplements.   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 She does not have any evidence of chronic liver disease.  Follow-up visit 12/12/2017 Patient was admitted to the hospital early this month secondary to lower abdominal pain, CT revealed mild rectosigmoid thickening. She reports that her diarrhea resolved after initiation of cholestyramine. She is currently taking 4 g twice daily. Having one bowel movement daily. She denies rectal bleeding. Her anal fissure had also significantly improved since control of her diarrhea. She has not received topical nitroglycerin ointment. However, she continues to have lower abdominal pain, constant with no relation to food or bowel movements. She tried dicyclomine which did not help. She is seeing Dr. Mike Gip for follow-up of lung nodules and is scheduled to have a CT scan in 02/2018. Her disease has not progressed to date. She also has B12 deficiency for which she is taking B12 injections. She stopped taking NSAIDs  Follow-up visit 02/11/2018 Patient was at her weight has been stable. She feels like she is dependent on Bentyl  and cholestyramine for her diarrhea and abdominal pain. Her diarrhea recurs if she does not take cholestyramine. She takes 2 packets daily which in fact leads to constipation. Bentyl helps with her chronic lower  abdominal cramps. Medication for hepatitis C treatment has been approved, and waiting for the medicine to be shipped. She underwent colonoscopy which showed anal condylomata.  Follow up visit 05/31/2018 Patient underwent left knee replacement at [facility] on 04/24/2018. She also had laser ablation of anal condylomata on 02/22/2018 by Dr. Johney Maine. Both procedures went well. 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 Patient was started on chemotherapy on 05/15/2018. She developed abdominal pain with nausea, vomiting and diarrhea for last 2 weeks. She underwent CT abdomen and pelvis which revealed thickening of the colon, underwent stool studies including C. Difficile and other GI pathogen panel which came back negative. Patient is started on ciprofloxacin 3 days ago. I got a call from Ander Purpura, NP from [place] yesterday about her ongoing diarrhea. I recommended to increase cholestyramine to 3 times daily and add Lomotil. Patient is here as a urgent follow-up of her diarrhea  She took third dose of cholestyramine and took 1 dose of Lomotil last night and she had 1 soft bowel movement this morning. She thinks her diarrhea has slowed down. He otherwise denies any other GI symptoms  Follow-up visit 07/09/2018 Restarted chemotherapy for cervical cancer. She reports that her diarrhea recurred after starting chemotherapy. She reports lower abdominal cramps as well. Currently, experiencing very loose, muddy like bowel movements several times a day and sometimes at night. She is on cholestyramine 2 packets daily, Imodium twice daily, Lomotil twice daily. She had repeat stool studies by Dr. Mike Gip on and negative for infection. Her weight has been stable, she denies nausea, appetite is intact. She was also  taking oral magnesium which has been just stopped  Follow-up visit 08/19/2018 Patient made an urgent visit today due to worsening diarrhea since starting antibiotics for dental infection.  She is currently on amoxicillin.  She reports that for the last 1 month her diarrhea has been intermittent, had good days.  She reports the diarrhea really got worse after starting antibiotics.  Her last chemo was about a month ago.  She is waiting for her WBC count to be improved before starting next cycle of chemo.  She is currently on colestipol 1 pill daily, Viberzi 100 mg twice daily, started 2 days ago, tincture of opium 3 times daily, Lomotil once daily, budesonide 3 mg 3 pills daily.  Patient was hospitalized to [facility] few weeks ago, underwent EGD and colonoscopy.  Colonoscopy was unremarkable including biopsies.  EGD revealed moderate villous blunting on duodenal biopsies. However her TTG IgA was negative in the past.  Therefore, I started her empirically on budesonide She is tearful due to ongoing diarrhea, incontinence  Follow-up visit 08/28/2018 Patient made an urgent visit to see me today at the request of her oncologist, Dr. Mike Gip because of low serum cortisol levels.  Her morning cortisol level was 2.7 mcg/dL on 08/20/2018.  Patient is in pleasant mood today as her diarrhea significantly improved.  Currently having upto 2, pudding like stools daily without nocturnal diarrhea.  She thinks adding amitriptyline definitely helped with her diarrhea as well as her mood.  She states that she is not "weepy" like she used to be.  Viberzi was  also a new medication that was added within last 10 days.  Currently, she is taking budesonide 9 mg daily, Viberzi 100 mg twice daily, Bentyl as needed, tincture of opium 3 times daily, colestipol with each meal.  She is not taking Lomotil regularly.  Planning to undergo fourth cycle of chemo in next 1 to 2 weeks.  She completed a course of antibiotics.  Follow-up visit  10/21/2018 Dr. Mike Gip requested to see this patient today due to ongoing lower abdominal pain associated with diarrhea.  Since last visit, patient underwent fifth cycle of chemotherapy in 09/2018.  Patient was recently hospitalized secondary to hematochezia 10/08/2018 to 09/16/18 and underwent colonoscopy which revealed few aphthae in the neo terminal ileum which were biopsied.  There was no presence of Crohn's disease.  Patient received 2 units of PRBCs in the hospital. The colon was normal.  Xarelto was held.  Patient was rehospitalized on 10/17/2018 after a CT of the abdomen pelvis which was concerning for small bowel obstruction.  The CT was originally ordered by her PCP due to vaginal bleeding.  Patient was evaluated by surgery who were not concerned about small bowel obstruction and was discharged home.  Patient reports having intermittent episodes of diarrhea associated with severe lower abdominal cramps and nausea.  She is taking colestipol 3 times a day, Viberzi 100 mg twice daily, budesonide 3 mg 3 times daily, hydrocortisone 5 mg 3 times a day and amitriptyline 50 mg daily.  She has appointment with endocrinology in 02/2019.  She reports having severe lower abdominal cramps when she has a bowel movement which causes nausea and emesis.  She is able to maintain weight.  She reports abdominal bloating as well.  She denies rectal bleeding or black-colored stools.  Her hemoglobin today at cancer center is 13.2.  Patient is tearful with ongoing diarrhea that has caused few episodes of fecal incontinence.  She denies smoking or drinking alcohol.   NSAIDs: Has stopped taking NSAIDs  Antiplts/Anticoagulants/Anti thrombotics: on Xarelto for factor V bleeding mutation  GI Procedures:  Colonoscopy 10/10/2018 The perianal and digital rectal examinations were normal. There was evidence of a prior end-to-side ileo-colonic anastomosis in the transverse colon. This was patent and was characterized by healthy  appearing mucosa. The anastomosis was traversed. 3 aphthous ulcers in the neoterminal ileum, biopsied DIAGNOSIS:  A. TERMINAL ILEUM, ULCER; COLD BIOPSY:  - ENTERIC MUCOSA WITH NORMAL VILLOUS ARCHITECTURE.  - INFLAMED SQUAMOUS MUCOSA.  - FRAGMENTS OF FIBRINOPURULENT ULCER DEBRIS.  - NEGATIVE FOR DYSPLASIA AND MALIGNANCY.  Immunohistochemical studies directed against HSV 1/2 and CMV, with  appropriately reactive controls, are negative. A GMS special stain is  negative for fungal organisms.   EGD and colonoscopy 07/30/2018 DIAGNOSIS:  A. DUODENUM; COLD BIOPSY:  - MODERATE DUODENOPATHY WITH VILLOUS BLUNTING, SEE COMMENT.   Comment:  The duodenal biopsies show shortened and shrunken villi with reactive  changes of surface epithelium. Intraepithelial lymphocytes are not  increased. Peptic duodenitis and medication effect should be considered.  The features are not typical for malabsorption, but clinical correlation  is recommended.   B. NEO-TERMINAL ILEUM; COLD BIOPSY:  - ILEAL MUCOSA WITH INTACT VILLI.  - NEGATIVE FOR ACTIVE INFLAMMATION, INTRAEPITHELIAL LYMPHOCYTOSIS,  INFECTIOUS AGENTS, AND GRANULOMAS.  - NEGATIVE FOR DYSPLASIA AND MALIGNANCY.   C. COLON; RANDOM COLD BIOPSY:  - NONSPECIFIC CRYPT HYPERPLASIA.  - NEGATIVE FOR ACTIVE INFLAMMATION AND MICROSCOPIC COLITIS.  - NEGATIVE FOR DYSPLASIA AND MALIGNANCY.   EGD 12/30/2017 - Normal duodenal bulb and second portion  of the duodenum. - Erythematous mucosa in the stomach. Biopsied. - Normal gastroesophageal junction and esophagus.  Colonoscopy 12/30/2017 - Perianal condylomata found on perianal exam. - Patent end-to-side ileo-colonic anastomosis, characterized by healthy appearing mucosa. - The examined portion of the ileum was normal. Biopsied. - Multiple non-bleeding colonic angioectasias. - Normal mucosa in the entire examined colon. Biopsied. DIAGNOSIS:  A. STOMACH, ANTRUM; COLD BIOPSY:  - MILD CHRONIC GASTRITIS  AND REGENERATIVE/REPARATIVE CHANGE.  - NEGATIVE FOR H. PYLORI, DYSPLASIA AND MALIGNANCY.   B. STOMACH, BODY; COLD BIOPSY:  - MINIMAL CHRONIC GASTRITIS AND REGENERATIVE/REPARATIVE CHANGE.  - NEGATIVE FOR H. PYLORI, DYSPLASIA AND MALIGNANCY.   Note: The histologic findings in the antrum and body resemble reactive  gastropathy. Etiologies include drugs/chemical injury (NSAIDs vs.  other), bile reflux, and changes adjacent to an area of healing  ulceration. Clinical correlation with endoscopic findings is required.   C. TERMINAL ILEUM; COLD BIOPSY:  - PARTIALLY FRAGMENTED SMALL BOWEL MUCOSA WITH OVERALL INTACT VILLOUS  ARCHITECTURE.  - NEGATIVE FOR INTRAEPITHELIAL LYMPHOCYTOSIS, DYSPLASIA AND MALIGNANCY.   D. RANDOM COLON; COLD BIOPSY:  - COLONIC MUCOSA NEGATIVE FOR MICROSCOPIC COLITIS, DYSPLASIA AND  MALIGNANCY.   Colonoscopy at [facility] 10/05/2016 Impression:           - One diminutive polyp in the cecum,                        removed with a cold biopsy forceps.                        Resected and retrieved.                       - One 5 mm polyp in the cecum,                        removed with a jumbo cold forceps.                        Resected and retrieved.                       - One 6 mm polyp in the cecum,                        removed with a hot snare. Resected                        and retrieved.                       - One 5 mm polyp in the descending                        colon, removed with a jumbo cold                        forceps. Resected and retrieved. Date Collected: 10/05/2016 14:48 Diagnosis A.COLON, LABELED AS "CECAL POLYP", BIOPSY:  TUBULAR ADENOMA  SESSILE SERRATED ADENOMA  B.COLON, LABELED AS "TRANSVERSE COLON POLYP", BIOPSY:  TUBULAR ADENOMA  She denies family history of celiac disease, autoimmune conditions, IBD, GI malignancy  Past Medical History:  Diagnosis Date  . Abnormal cervical Papanicolaou smear 09/18/2017  . Anxiety   .  Aortic atherosclerosis ([place])   .  Arthritis    neck and knees  . 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 Blood transfusion without reported diagnosis   . Cervical cancer ([place]) 09/2016   mets lung  . Chronic anal fissure   . Chronic diarrhea   . Dyspnea   . Erosive gastropathy 09/18/2017  . Factor V Leiden mutation ([place])   . Fecal incontinence   . Genital warts   . GERD (gastroesophageal reflux disease)   . Heart murmur   . Hemorrhoids   . 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 Hepatitis, chronic ([place]) 05/05/2017  . History of cancer chemotherapy    completed 06/2017  . History of Clostridium difficile infection    while undergoing chemo.  Negative test 10/2017  . Ileocolic anastomotic leak   . 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 Intestinal infection due to Clostridium difficile 09/18/2017  . Macrocytic anemia with vitamin B12 deficiency   . Nausea vomiting and diarrhea   . Pancolitis ([place]) 07/27/2018  . Perianal condylomata   . Pneumonia    History of  . Pulmonary nodules   . Rectal bleeding   . Small bowel obstruction ([place]) 08/2017  . Stiff neck    limited right turn  . Vitamin D deficiency     Past Surgical History:  Procedure Laterality Date  . CHOLECYSTECTOMY    . COLON SURGERY  08/2017   resection  . COLONOSCOPY WITH PROPOFOL N/A 12/20/2017   Procedure: COLONOSCOPY WITH PROPOFOL;  Surgeon: [name], MD;  Location: [facility] ENDOSCOPY;  Service: Gastroenterology;  Laterality: N/A;  . COLONOSCOPY WITH PROPOFOL N/A 07/30/2018   Procedure: COLONOSCOPY WITH PROPOFOL;  Surgeon: [name], MD;  Location: [facility] ENDOSCOPY;  Service: Gastroenterology;  Laterality: N/A;  . COLONOSCOPY WITH PROPOFOL N/A 10/10/2018   Procedure: COLONOSCOPY WITH PROPOFOL;  Surgeon: Lucilla Lame, MD;  Location: [facility] ENDOSCOPY;  Service: Endoscopy;  Laterality: N/A;  . DIAGNOSTIC LAPAROSCOPY    . ESOPHAGOGASTRODUODENOSCOPY  (EGD) WITH PROPOFOL N/A 12/20/2017   Procedure: ESOPHAGOGASTRODUODENOSCOPY (EGD) WITH PROPOFOL;  Surgeon: [name], MD;  Location: [place];  Service: Gastroenterology;  Laterality: N/A;  . ESOPHAGOGASTRODUODENOSCOPY (EGD) WITH PROPOFOL  07/30/2018   Procedure: ESOPHAGOGASTRODUODENOSCOPY (EGD) WITH PROPOFOL;  Surgeon: [name], MD;  Location: [facility] ENDOSCOPY;  Service: Gastroenterology;;  . LAPAROTOMY N/A 08/31/2017   Procedure: EXPLORATORY LAPAROTOMY for SBO, ileocolectomy, removal of piece of uterine wall;  Surgeon: [name], MD;  Location: [facility] ORS;  Service: General;  Laterality: N/A;  . LASER ABLATION CONDOLAMATA N/A 02/22/2018   Procedure: LASER ABLATION/REMOVAL OF [id number] AROUND ANUS AND VAGINA;  Surgeon: Michael Boston, MD;  Location: [place];  Service: General;  Laterality: N/A;  . OOPHORECTOMY    . PORTA CATH INSERTION N/A 05/13/2018   Procedure: PORTA CATH INSERTION;  Surgeon: Katha Cabal, MD;  Location: [facility] CV LAB;  Service: Cardiovascular;  Laterality: N/A;  . SMALL INTESTINE SURGERY    . TANDEM RING INSERTION     x3  . THORACOTOMY    . TOTAL KNEE ARTHROPLASTY Left 04/24/2018   Procedure: TOTAL KNEE ARTHROPLASTY;  Surgeon: Lovell Sheehan, MD;  Location: [facility] ORS;  Service: Orthopedics;  Laterality: Left;    Current Outpatient Medications:  .  amitriptyline (ELAVIL) 50 MG tablet, Take 1 tablet (50 mg total) by mouth at bedtime., Disp: 30 tablet, Rfl: 2 .  budesonide (ENTOCORT EC) 3 MG 24 hr capsule, Take 3 capsules (9 mg total) by mouth daily., Disp: 30 capsule, Rfl: 0 .  Calcium Carb-Cholecalciferol (CALCIUM 500 +D) 500-400 MG-UNIT TABS, Take 2 tablets by mouth daily., Disp: , Rfl:  .  diphenoxylate-atropine (LOMOTIL) 2.5-0.025 MG tablet, Take 2 tablets by mouth 4 (four) times daily., Disp: 30 tablet, Rfl: 0 .  hydrocortisone (CORTEF) 10 MG tablet, Take 10 mg by mouth 3 (three) times daily., Disp: , Rfl:  .   Multiple Vitamins-Minerals (MULTIVITAMIN WITH MINERALS) tablet, Take 1 tablet by mouth daily., Disp: 30 tablet, Rfl: 0 .  nitrofurantoin, macrocrystal-monohydrate, (MACROBID) 100 MG capsule, Take 1 capsule (100 mg total) by mouth 2 (two) times daily. For UTI, Disp: 10 capsule, Rfl: 0 .  ondansetron (ZOFRAN) 8 MG tablet, Take 1 tablet (8 mg total) by mouth every 8 (eight) hours as needed for nausea or vomiting., Disp: 30 tablet, Rfl: 3 .  oxyCODONE (OXY IR/ROXICODONE) 5 MG immediate release tablet, Take 1 tablet (5 mg total) by mouth every 6 (six) hours as needed for moderate pain or severe pain., Disp: 30 tablet, Rfl: 0 .  pantoprazole (PROTONIX) 40 MG tablet, Take 1 tablet (40 mg total) by mouth daily., Disp: 30 tablet, Rfl: 0 .  promethazine (PHENERGAN) 25 MG tablet, Take 1 tablet (25 mg total) by mouth every 6 (six) hours as needed for nausea or vomiting., Disp: 30 tablet, Rfl: 0 .  VENTOLIN HFA 108 (90 Base) MCG/ACT inhaler, Inhale 1-2 puffs into the lungs every 4 (four) hours as needed for shortness of breath. , Disp: , Rfl: 0 .  VIBERZI 100 MG TABS, TAKE 1 TABLET BY MOUTH TWICE DAILY WITH  A  MEAL, Disp: 60 tablet, Rfl: 0 .  hyoscyamine (LEVBID) 0.375 MG 12 hr tablet, Take 1 tablet (0.375 mg total) by mouth 2 (two) times daily., Disp: 60 tablet, Rfl: 0 .  octreotide (SANDOSTATIN LAR) 30 MG injection, Inject 30 mg into the muscle every 28 (twenty-eight) days., Disp: 1 each, Rfl: 0 .  rifaximin (XIFAXAN) 550 MG TABS tablet, Take 1 tablet (550 mg total) by mouth 2 (two) times daily for 14 days., Disp: 28 tablet, Rfl: 0 No current facility-administered medications for this visit.   Facility-Administered Medications Ordered in Other Visits:  .  heparin lock flush 100 unit/mL, 500 Units, Intravenous, Once, Corcoran, Melissa C, MD .  sodium chloride 0.9 % 1,000 mL with potassium chloride 20 mEq infusion, , Intravenous, Once, Karen Kitchens, NP   Family History  Problem Relation Age of Onset  .  Hypertension Father   . Diabetes Father   . Alcohol abuse Daughter   . Hypertension Maternal Grandmother   . Diabetes Maternal Grandmother   . Diabetes Paternal Grandmother   . Hypertension Paternal Grandmother      Social History   Tobacco Use  . Smoking status: Former Smoker    Last attempt to quit: 10/16/2006    Years since quitting: 12.0  . Smokeless tobacco: Never Used  Substance Use Topics  . Alcohol use: Not Currently    Frequency: Never    Comment: seldom  . Drug use: Yes    Types: Marijuana    Allergies as of 10/21/2018 - Review Complete 10/21/2018  Allergen Reaction Noted  . Ketamine Anxiety and Other (See Comments) 02/22/2017    Review of Systems:    All systems reviewed and negative except where noted in HPI.   Physical Exam:  BP 127/85 (BP Location: Left Arm,  Patient Position: Sitting, Cuff Size: Normal)   Pulse 97   Resp 18   Ht 5' 8"  (1.727 m)   Wt 173 lb 9.6 oz (78.7 kg)   BMI 26.40 kg/m  No LMP recorded. Patient is postmenopausal.  General:   Alert,  Well-developed, well-nourished, pleasant and cooperative in NAD Head:  Normocephalic and atraumatic. Eyes:  Sclera clear, no icterus.   Conjunctiva pink. Ears:  Normal auditory acuity. Nose:  No deformity, discharge, or lesions. Mouth:  No deformity or lesions,oropharynx pink & moist. Neck:  Supple; no masses or thyromegaly. Lungs:  Respirations even and unlabored.  Clear throughout to auscultation.   No wheezes, crackles, or rhonchi. No acute distress. Heart:  Regular rate and rhythm; no murmurs, clicks, rubs, or gallops. Abdomen:  Normal bowel sounds. Soft,mild diffuse lower abdominal tenderness, and non-distended without masses, hepatosplenomegaly or hernias noted.  No guarding or rebound tenderness.   Rectal: Not performed Msk:  Symmetrical without gross deformities. Good, equal movement & strength bilaterally. Pulses:  Normal pulses noted. Extremities:  No clubbing or edema.  No  cyanosis. Neurologic:  Alert and oriented x3;  grossly normal neurologically. Skin:  Intact without significant lesions or rashes. No jaundice. Lymph Nodes:  No significant cervical adenopathy. Psych:  Alert and cooperative.  Sad mood and affect.  Imaging Studies: reviewed  Assessment and Plan:   Joanna Hall is a 53 y.o. Caucasian female with history of chronic hepatitis C, treatment nave, stage IV cervical adenocarcinoma, status post chemotherapy XRT and brachytherapy, ongoing treatment at [facility] cancer center, complicated by radiation-induced small bowel obstruction, S/P exploratory laparoscopy with ileocolectomy in 08/2017 seen in follow-up for chronic hepatitis C, chronic diarrhea  Chronic hep C: Genotype 2a/2c, VL 82,400  Treatment nave, plan is to initiate [place] for 8 weeks  Patient is currently undergoing chemotherapy for cervical cancer There is no evidence of chronic liver disease She had alkaline phosphatase Isoenzymes and liver component is elevated, GGT 209, elevated Hepatitis B surface antigen negative HIV nonreactive hepatitis B surface antibody, core antibody negative Other secondary causes for elevated LFTs are unremarkable  Chronic nonbloody diarrhea: It has been quite challenging to treat her diarrhea despite trying combination of various antidiarrheal medications.  She has history of ileocolectomy. Pancreatic fecal elastase, fecal calprotectin are normal, CRP came back normal. No evidence of microscopic colitis. Worsening of diarrhea after chemotherapy, recent EGD revealed a moderate villous blunting on the duodenal biopsies.  Repeat colonoscopy was unremarkable for colonic pathology.  Most recent colonoscopy revealed 3 after in the neoterminal ileum with no evidence of chronicity.  These ulcers are nonspecific and unclear etiology. Morning serum cortisol levels came back low at 2.7 mcg/dL. 5HIAA normal, celiac serologies TTG and DGP negative.  Serum gastrin  mildly elevated which is clinically insignificant. Chromogranin A and VIP levels returned normal.  Cross-sectional imaging has been unremarkable without evidence of space-occupying lesions in the GI tract. Her diarrhea has been transiently responding to the above regimen.  She reports that she feels good for 2 to 3 days then her diarrhea recurs with severe lower abdominal cramps associated with nausea. Due to low serum cortisol levels, I referred her to endocrine to evaluate for adrenal insufficiency, appointment scheduled for 02/2019.  In the meantime, I started her on hydrocortisone 5 mg 3 times a day.  I discontinued Lomotil, tincture of opium at this time.   -Continue colestipol 1 pill each meal -Continue budesonide 3 mg 3 capsules daily -Increase amitriptyline to 75 mg at  bedtime -Continue Viberzi 100 mg twice daily -Continue hydrocortisone 5 mg 3 times daily -Trial of octreotide IM 54m -Empiric trial of rifaximin 550 mg twice daily -Levbid 0.375 milligrams twice daily for abdominal cramps -Schedule VCE -I will refer her to [facility] for second opinion if her diarrhea persists despite trial of octreotide and pending video capsule endoscopy results  Chronic anal fissure and symptomatic hemorrhoids: - Her fissure has significantly improved - I will defer outpatient hemorrhoid ligation for now - she underwent removal of anal condylomata  Follow up in 1 month or sooner if diarrhea persists  RCephas Darby MD

## 2018-10-22 ENCOUNTER — Ambulatory Visit: Payer: Medicaid Other

## 2018-10-22 ENCOUNTER — Ambulatory Visit: Payer: Medicaid Other | Admitting: Family Medicine

## 2018-10-22 ENCOUNTER — Telehealth: Payer: Self-pay

## 2018-10-22 ENCOUNTER — Ambulatory Visit: Payer: Medicaid Other | Admitting: Hematology and Oncology

## 2018-10-22 ENCOUNTER — Other Ambulatory Visit: Payer: Medicaid Other

## 2018-10-22 ENCOUNTER — Encounter: Payer: Self-pay | Admitting: Family Medicine

## 2018-10-22 ENCOUNTER — Other Ambulatory Visit: Payer: Self-pay

## 2018-10-22 ENCOUNTER — Encounter: Payer: Self-pay | Admitting: Gastroenterology

## 2018-10-22 DIAGNOSIS — B182 Chronic viral hepatitis C: Secondary | ICD-10-CM

## 2018-10-22 DIAGNOSIS — C799 Secondary malignant neoplasm of unspecified site: Secondary | ICD-10-CM | POA: Diagnosis not present

## 2018-10-22 DIAGNOSIS — R197 Diarrhea, unspecified: Secondary | ICD-10-CM | POA: Diagnosis not present

## 2018-10-22 DIAGNOSIS — K529 Noninfective gastroenteritis and colitis, unspecified: Secondary | ICD-10-CM

## 2018-10-22 DIAGNOSIS — C538 Malignant neoplasm of overlapping sites of cervix uteri: Secondary | ICD-10-CM | POA: Diagnosis not present

## 2018-10-22 DIAGNOSIS — C7800 Secondary malignant neoplasm of unspecified lung: Secondary | ICD-10-CM

## 2018-10-22 NOTE — Assessment & Plan Note (Signed)
Seeing oncologist

## 2018-10-22 NOTE — Assessment & Plan Note (Signed)
Abnormal LFTs; patient cannot start treatment she reports until done with chemo

## 2018-10-22 NOTE — Patient Instructions (Signed)
Please call me if there is anything I can do

## 2018-10-22 NOTE — Assessment & Plan Note (Signed)
Chronic; seeing GI locally and sounds as if she is being referred to subspecialist at [facility] for same problem

## 2018-10-22 NOTE — Progress Notes (Signed)
BP 104/62   Pulse 92   Temp 98.1 F (36.7 C) (Oral)   Ht 5' 7"  (1.702 m)   Wt 175 lb 3.2 oz (79.5 kg)   SpO2 95%   BMI 27.44 kg/m    Subjective:    Patient ID: Joanna Hall, female    DOB: 1966-10-04, 53 y.o.   MRN: [id number]  HPI: Joanna Hall is a 53 y.o. female  Chief Complaint  Patient presents with  . Hospitalization Follow-up    anemia    HPI Patient is here for hospital f/u  Admitted for a few days at first for low blood counts and got transfusion; did not help energy Then had to go back for possible bowel obstruction that turned out okay Last week, no fevers Appetite is so so Not sleeping okay; she tosses and turns all night, going to the bathroom, stooling 10-12 times a day when flared up They are going to send her to [facility], no one can figure out what's wrong  They just did labs yesterday at the cancer center; patient already got her results Reviewed: normal folic acid, very high F62 (not taking any), low Mg2+ and cannot tolerate PO mag, has to get intravenous; abnormal liver enzymes from hepatitis Hgb came up to 13.2  She has not had chemo in 3 weeks; she still has five more months of chemo Multiple nodules in the lungs; having shortness of breath with walking; no supplement oxygen  Bladder infection; treated with macrobid; still having burning with urination; they gave her just a few days of medicine, but they don't want her on more antibiotics because of risk of C diff  Low platelet count from chemo  Depression screen [facility] 2/9 10/22/2018 04/02/2018 02/28/2018  Decreased Interest 0 1 0  Down, Depressed, Hopeless 1 2 0  PHQ - 2 Score 1 3 0  Altered sleeping 3 3 -  Tired, decreased energy 3 3 -  Change in appetite 0 0 -  Feeling bad or failure about yourself  0 0 -  Trouble concentrating 0 0 -  Moving slowly or fidgety/restless 0 0 -  Suicidal thoughts 0 0 -  PHQ-9 Score 7 9 -  Difficult doing work/chores Not difficult at all Somewhat  difficult -  Some recent data might be hidden  no SI/HI Just depressing being sick all the time; talks to some people at the cancer center   Fall Risk  10/22/2018 09/03/2018 07/01/2018 04/02/2018 02/28/2018  Falls in the past year? 1 0 No Yes No  Number falls in past yr: 0 - - 1 -  Injury with Fall? 0 - - Yes -    Relevant past medical, surgical, family and social history reviewed Past Medical History:  Diagnosis Date  . Abnormal cervical Papanicolaou smear 09/18/2017  . Anxiety   . Aortic atherosclerosis ([place])   . Arthritis    neck and knees  . 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 Blood transfusion without reported diagnosis   . Cervical cancer ([place]) 09/2016   mets lung  . Chronic anal fissure   . Chronic diarrhea   . Dyspnea   . Erosive gastropathy 09/18/2017  . Factor V Leiden mutation ([place])   . Fecal incontinence   . Genital warts   . GERD (gastroesophageal reflux disease)   . Heart murmur   . Hemorrhoids   . 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 Hepatitis, chronic ([place]) 05/05/2017  . History of cancer chemotherapy    completed 06/2017  . History of Clostridium difficile infection    while undergoing chemo.  Negative test 10/2017  . Ileocolic anastomotic leak   . 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 Intestinal infection due to Clostridium difficile 09/18/2017  . Macrocytic anemia with vitamin B12 deficiency   . Multiple gastric ulcers   . Nausea vomiting and diarrhea   . Pancolitis ([place]) 07/27/2018  . Perianal condylomata   . Pneumonia    History of  . Pulmonary nodules   . Rectal bleeding   . Small bowel obstruction ([place]) 08/2017  . Stiff neck    limited right turn  . Vitamin D deficiency    Past Surgical History:  Procedure Laterality Date  . CHOLECYSTECTOMY    . COLON SURGERY  08/2017   resection  . COLONOSCOPY WITH PROPOFOL N/A 12/20/2017   Procedure: COLONOSCOPY WITH PROPOFOL;  Surgeon:  [name], MD;  Location: [facility] ENDOSCOPY;  Service: Gastroenterology;  Laterality: N/A;  . COLONOSCOPY WITH PROPOFOL N/A 07/30/2018   Procedure: COLONOSCOPY WITH PROPOFOL;  Surgeon: [name], MD;  Location: [facility] ENDOSCOPY;  Service: Gastroenterology;  Laterality: N/A;  . COLONOSCOPY WITH PROPOFOL N/A 10/10/2018   Procedure: COLONOSCOPY WITH PROPOFOL;  Surgeon: Lucilla Lame, MD;  Location: [facility] ENDOSCOPY;  Service: Endoscopy;  Laterality: N/A;  . DIAGNOSTIC LAPAROSCOPY    . ESOPHAGOGASTRODUODENOSCOPY (EGD) WITH PROPOFOL N/A 12/20/2017   Procedure: ESOPHAGOGASTRODUODENOSCOPY (EGD) WITH PROPOFOL;  Surgeon: [name], MD;  Location: [place];  Service: Gastroenterology;  Laterality: N/A;  . ESOPHAGOGASTRODUODENOSCOPY (EGD) WITH PROPOFOL  07/30/2018   Procedure: ESOPHAGOGASTRODUODENOSCOPY (EGD) WITH PROPOFOL;  Surgeon: [name], MD;  Location: [facility] ENDOSCOPY;  Service: Gastroenterology;;  . LAPAROTOMY N/A 08/31/2017   Procedure: EXPLORATORY LAPAROTOMY for SBO, ileocolectomy, removal of piece of uterine wall;  Surgeon: [name], MD;  Location: [facility] ORS;  Service: General;  Laterality: N/A;  . LASER ABLATION CONDOLAMATA N/A 02/22/2018   Procedure: LASER ABLATION/REMOVAL OF [id number] AROUND ANUS AND VAGINA;  Surgeon: Michael Boston, MD;  Location: [facility];  Service: General;  Laterality: N/A;  . OOPHORECTOMY    . PORTA CATH INSERTION N/A 05/13/2018   Procedure: PORTA CATH INSERTION;  Surgeon: Katha Cabal, MD;  Location: [facility];  Service: Cardiovascular;  Laterality: N/A;  . SMALL INTESTINE SURGERY    . TANDEM RING INSERTION     x3  . THORACOTOMY    . TOTAL KNEE ARTHROPLASTY Left 04/24/2018   Procedure: TOTAL KNEE ARTHROPLASTY;  Surgeon: Lovell Sheehan, MD;  Location: [facility] ORS;  Service: Orthopedics;  Laterality: Left;   Family History  Problem Relation Age of Onset  . Hypertension Father   . Diabetes Father     . Alcohol abuse Daughter   . Hypertension Maternal Grandmother   . Diabetes Maternal Grandmother   . Diabetes Paternal Grandmother   . Hypertension Paternal Grandmother    Social History   Tobacco Use  . Smoking status: Former Smoker    Last attempt to quit: 10/16/2006    Years since quitting: 12.0  . Smokeless tobacco: Never Used  Substance Use Topics  . Alcohol use: Not Currently    Frequency: Never    Comment: seldom  . Drug use: Yes    Types: Marijuana     Office Visit from 10/22/2018 in [facility]  AUDIT-C Score  0      Interim medical history since last visit reviewed. Allergies and medications reviewed  Review of Systems Per HPI unless specifically indicated above     Objective:    BP 104/62   Pulse 92   Temp 98.1 F (36.7 C) (Oral)   Ht 5' 7"  (1.702 m)   Wt 175 lb 3.2 oz (79.5 kg)   SpO2 95%   BMI 27.44 kg/m   Wt Readings from Last 3 Encounters:  10/22/18 175 lb 3.2 oz (79.5 kg)  10/21/18 173 lb 9.6 oz (78.7 kg)  10/21/18 169 lb 1.5 oz (76.7 kg)    Physical Exam Constitutional:      General: She is not in acute distress.    Appearance: She is well-developed. She is not diaphoretic.  HENT:     Head: Normocephalic and atraumatic.  Eyes:     General: No scleral icterus. Neck:     Thyroid: No thyromegaly.  Cardiovascular:     Rate and Rhythm: Normal rate and regular rhythm.     Heart sounds: Normal heart sounds.  Pulmonary:     Effort: Pulmonary effort is normal. No respiratory distress.     Breath sounds: Normal breath sounds. No wheezing.  Abdominal:     General: Bowel sounds are normal. There is no distension.     Palpations: Abdomen is soft.  Skin:    General: Skin is warm and dry.     Coloration: Skin is not pale.     Comments: alopecia  Neurological:     Mental Status: She is alert.  Psychiatric:        Mood and Affect: Mood is not anxious or depressed.        Behavior: Behavior normal.        Thought Content:  Thought content normal.        Judgment: Judgment normal.     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  An after-visit summary was printed and given to the patient at [place].  Please see the patient instructions which may contain other information and recommendations beyond what is  mentioned above in the assessment and plan.  No orders of the defined types were placed in this encounter.   No orders of the defined types were placed in this encounter.

## 2018-10-22 NOTE — Telephone Encounter (Signed)
Kent from [employer] called regarding electronic rx for Sandostatin 65m.  Sandostatin 30 mg is not available at [facility]  They only have it available in 20 mg.  Please advise. This message has been routed to [email or web address].  [facility] ([phone] Thanks MSharyn Lull

## 2018-10-22 NOTE — Assessment & Plan Note (Signed)
Chronic issue; she cannot tolerate oral Mg2+ so she gets IV through cancer center

## 2018-10-22 NOTE — Assessment & Plan Note (Signed)
Mets to lung; follow-up at the cancer center; support offered

## 2018-10-22 NOTE — Assessment & Plan Note (Signed)
Reviewed last CT scan; she is aware of mets to lung

## 2018-10-23 ENCOUNTER — Other Ambulatory Visit: Payer: Self-pay

## 2018-10-23 ENCOUNTER — Other Ambulatory Visit: Payer: Self-pay | Admitting: Gastroenterology

## 2018-10-23 ENCOUNTER — Encounter: Payer: Self-pay | Admitting: Obstetrics and Gynecology

## 2018-10-23 ENCOUNTER — Inpatient Hospital Stay: Payer: Medicaid Other | Attending: Obstetrics and Gynecology | Admitting: Obstetrics and Gynecology

## 2018-10-23 ENCOUNTER — Ambulatory Visit: Payer: Medicaid Other

## 2018-10-23 VITALS — BP 111/75 | HR 97 | Temp 97.0°F | Resp 16 | Ht 68.0 in | Wt 177.2 lb

## 2018-10-23 DIAGNOSIS — Z87891 Personal history of nicotine dependence: Secondary | ICD-10-CM

## 2018-10-23 DIAGNOSIS — Z90722 Acquired absence of ovaries, bilateral: Secondary | ICD-10-CM | POA: Diagnosis not present

## 2018-10-23 DIAGNOSIS — N939 Abnormal uterine and vaginal bleeding, unspecified: Secondary | ICD-10-CM | POA: Insufficient documentation

## 2018-10-23 DIAGNOSIS — R300 Dysuria: Secondary | ICD-10-CM | POA: Insufficient documentation

## 2018-10-23 DIAGNOSIS — C539 Malignant neoplasm of cervix uteri, unspecified: Secondary | ICD-10-CM | POA: Diagnosis present

## 2018-10-23 DIAGNOSIS — A630 Anogenital (venereal) warts: Secondary | ICD-10-CM | POA: Insufficient documentation

## 2018-10-23 DIAGNOSIS — Z923 Personal history of irradiation: Secondary | ICD-10-CM | POA: Diagnosis not present

## 2018-10-23 DIAGNOSIS — C538 Malignant neoplasm of overlapping sites of cervix uteri: Secondary | ICD-10-CM

## 2018-10-23 DIAGNOSIS — B182 Chronic viral hepatitis C: Secondary | ICD-10-CM | POA: Diagnosis not present

## 2018-10-23 DIAGNOSIS — Z9221 Personal history of antineoplastic chemotherapy: Secondary | ICD-10-CM | POA: Insufficient documentation

## 2018-10-23 DIAGNOSIS — K529 Noninfective gastroenteritis and colitis, unspecified: Secondary | ICD-10-CM

## 2018-10-23 DIAGNOSIS — C7800 Secondary malignant neoplasm of unspecified lung: Secondary | ICD-10-CM | POA: Diagnosis not present

## 2018-10-23 MED ORDER — OCTREOTIDE ACETATE 20 MG IM KIT: 20.0000 mg | PACK | INTRAMUSCULAR | 0 refills | Status: DC

## 2018-10-23 MED ORDER — OCTREOTIDE ACETATE 30 MG IM KIT: 30.0000 mg | PACK | INTRAMUSCULAR | 0 refills | Status: DC

## 2018-10-23 NOTE — Progress Notes (Signed)
Patient here for follow up. She reports increasing lower right abdominal pain, burning with urination, occasional daily spotting.

## 2018-10-23 NOTE — Progress Notes (Signed)
Gynecologic Oncology Interval Visit   Referring Provider: Dr. Nolon Stalls  Chief Concern: Recurrent stage IB1 cervical cancer s/p chemoradiation with ileocolectomy for bowel obstruction.  Seen today for vaginal spotting.   Subjective:  Joanna Hall is a 53 y.o. female diagnosed with recurrent stage IB1 cervical adenocarcinoma, seen in consultation for Dr. Mike Gip, who returns to clinic today for follow-up.  She was diagnosed with probable recurrent stage IB1 cervical adenocarcinoma status post aborted radical hysterectomy due to infected adnexa on 2/18 at [facility] followed by chemo-radiation with apparent complete response. She developed bowel obstruction and had resection of terminal ileum and cecum 11/18 at [facility]. Imaging showed enlarging lung mets which were biopsy proven metastatic cervical adenocarcinoma. She initiated carboplatin-taxol on 06/04/18 and has completed 5 cycles.   She was admitted to hospital late December for lower GI bleed.  Colonoscopy revealed 3 bleeding terminal ileal ulcers but no anastomotic ulcer.  Xarelto was held.  She presented to clinic reporting vaginal spotting and was seen in symptom management by Beckey Rutter, NP on 10/15/2018.  Exam revealed small ulceration in vaginal cuff as possible previous source of bleeding now healing.  She was also reporting dysuria at that time.  UA was consistent with UTI and she was started on Macrobid however, culture was negative.  Given pain and symptoms imaging was performed.  10/17/2018-CT a/P w/ contrast IMPRESSION: 1. Exam positive for small bowel obstruction. Transition point is in the left hemiabdomen. No discrete obstructing mass noted. Findings may reflect adhesions. 2. No mass identified within the uterus or cervix or distal bowel to explain patient's vaginal and rectal bleeding. Correlation with direct visualization is advised. If further imaging is clinically indicated a contrast enhanced pelvic MRI may be helpful. 3.  Bilateral pulmonary nodules compatible with metastatic disease. 4.  Aortic Atherosclerosis (ICD10-I70.0).  09/30/2018-CT chest w contrast IMPRESSION: 1. Interval decrease in size and number of metastatic pulmonary nodules. 2. No new/progressive findings in the chest or upper abdomen. Aortic Atherosclerosis (ICD10-I70.0) and Emphysema (ICD10-J43.9).  Today, she reports continued vaginal spotting and dysuria.  Does not saturate pads.  Continues to have intermittent right lower quadrant pain controlled with oxycodone.  Gynecologic Oncology History The patient was diagnosed with cervical adenocarcinoma in [place] in 09/2016.  She has had a long standing history of abnormal PAP smears.  She presented with an ovarian cyst.  Laparoscopic surgery was difficult secondary to scar tissue.  Had BSO and rupture of cyst with purulent drainage into abdomen.  Had Stage IB1 (4 cm) endocervical adenocarcinoma. Radical hysterectomy aborted.   She was treated by Dr Christene Slates at [facility] in [place], [place]. Decision was made to pursue concurrent chemotherapy (weekly cisplatin) and radiation.  She received treatment from 11/2016 - 05/2017.  01/2017 cisplatin x 2 and Carboplatin x 1 (01/29/17) due to ARF and XRT. XRT followed by T & O on 02/01/17 and T & N 4.28/18 and 02/20/17 . Course was complicated by weight loss (80 pounds), nausea, vomiting, electrolyte wasting (potassium and magnesium).  She describes being "sick constantly" and requiring at least 20 hospitalizations.   She underwent follow-up chest CT then PET scan in 06/2017.  She states that "the radiation worked" and there was no disease in the abdomen.  She was noted to have lung nodules that were growing.  She was scheduled to have follow-up imaging in 10/2017.  She was admitted in [place] with a small bowel obstruction.  She was managed conservatively.  She  was home for about a week then traveled to [place] for Thanksgiving holiday (she has family here).  She presented on 08/27/2017 with nausea, vomiting, and lower abdominal pain.  Symptoms did not respond to conservative measurement.    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  She underwent laparotomy and right ileocolectomy on 08/31/2017 at [facility].  There were numerous pulmonary nodules c/w metastatic disease.  Surgical findings revealed a thickened, matted, and scarred piece of distal small bowel close to the ileocecal valve.  Diet was slowly advanced.  She was discharged on 09/05/2017.  Abdominal and pelvic CT without contrast on 09/11/2017 revealed debris within anterior abdominal wall incision concerning for infection, versus packing material.   She is s/p ileocolectomy with expected postoperative changes and mild colonic ileus.  There were numerous pulmonary nodules highly concerning for metastatic disease and punctate nonobstructing nephrolithiasis. She was readmitted on 09/12/2017.  She describes the onset of lower abdominal pain on 09/09/2017.  Pain markedly increased in intensity on 09/11/2017.   Staples were removed and the wound packed.  She was started on doxycycline.  Abdomen and pelvic CT with contrast revealed postsurgical changes from ileocecectomy with primary ileocolic anastomosis without evidence of abscess or leak.  There was edema of small bowel loops at distal ileum potentially related to surgery.  There was gas within ventral midline surgical wound corresponding to wound infection versus packing material.  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  She denies any history of thrombosis.  She denies any family history of thrombosis.  She stopped smoking 10 years ago (1/4 ppd)  and has a history of thoracotomy.  She denies any family history of rheumatologic problems/vasculitis/autoimmune disorders.  Thrombosis work up showed she is heterozygous for Factor V Leiden and taking Xarelto.  Patient had diarrhea despite Lomotil and Imodium with 15 watery BMs per day.  Dr. Hampton Abbot who did the bowel resection managing.  PET scan ordered to evaluate enlarging lung nodules with concern for recurrent cervical cancer. Scan not done yet due to insurance issues and she will have to get the study done in [place].   She was having chronic diarrhea postoperatively and has history of chronic hep C and was seen by surgery and GI for management.  On 12/03/2017 she presented to ER for abdominal pain and emesis.  Imaging revealed:  12/03/17- CT A/P: continued multiple pulmonary nodules inthelung bases concerning for metastatic disease. There was mild wall thickening of rectosigmoid colon is noted.There was a stable 1.8 x 1.6 cm mixed fat and soft tissue densitynoted in right lower quadrant which may represent fat necrosis.There was a larger low density is noted involving uterine fundus.   12/03/2017:  Pelvic MRI was unremarkable.   04/15/2018:  Chest, abdomen, and pelvis CT revealed innumerable (> 100) cavitary nodules scattered in the lungs, moderately enlarging compared to the 11/08/2017 PET-CT, suspicious for metastatic disease.  One index node in the RLL measures 1.0 x 1.1 cm (previously 0.6 x 0.6 cm).  There were no new nodules.  There was an ill-defined wall thickening in the rectosigmoid with surrounding stranding along fascia planes, probably sequela from prior radiation therapy.  There was multilevel lumbar impingement due to spondylosis and degenerative disc disease.  There was heterogeneous enhancement in the  uterus (some possibly from prior radiation therapy).  04/23/2018:  PET scan revealed numerous scattered solid and cavitary nodules in the lungs stable increased in  size compared to the prior PET-CT from 11/08/2017. Largest nodule was 1.1 cm in the LUL (SUV 1.9).  These demonstrated low-grade metabolic activity up to a maximum SUV of about 2.3, increased from 11/08/2017.   On 04/24/2018 she underwent left total knee replacement with Dr. [name].  She is hepatitis C positive.  Hepatitis C genotype is 2a/2c.  She received B12 injections for history of B12 deficiency.   Case was discussed at tumor board on 04/25/2018 since this was to pursue CT-guided biopsy of left upper lobe pulmonary nodule which was performed on 05/06/18- Pathology: Metastatic adenocarcinoma morphologically consistent with cervical adenocarcinoma.   06/04/18 She initiated carboplatin-Taxol.    Foundation One testing was positive for PD-L1.   CT A/P w/ contrast 07/27/18 for abdominal pain/nausea/diarrhea 1. 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 2. No acute or significant findings elsewhere within the abdomen or pelvis. No bowel obstruction. No abscess collection. No free intraperitoneal air. No evidence of acute solid organ abnormality. 3. Stable small nodules at the lung bases, some cavitary, compatible with previously described known metastatic disease. 4. Aortic Atherosclerosis (ICD10-I70.0).   Problem List: Patient Active Problem List   Diagnosis Date Noted  . Hematochezia   . GI bleed 10/08/2018  . Adrenal insufficiency ([place]) 09/03/2018  . Chronic diarrhea 06/25/2018  . Chronic anticoagulation 06/25/2018  . Vomiting   . Abdominal pain 06/10/2018  . Encounter for antineoplastic chemotherapy 06/04/2018  . 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 Lung metastasis ([place]) 05/15/2018  . Closed fracture of distal end of radius 05/04/2018  .  History of total knee arthroplasty 04/24/2018  . Osteoarthritis of left knee 02/28/2018  . Condyloma acuminatum of anus s/p ablation 02/22/2018 02/22/2018  . Condyloma acuminatum of vagina s/p ablation 02/22/2018 02/22/2018  . Positive ANA (antinuclear antibody) 02/04/2018  . Aortic atherosclerosis ([place]) 01/15/2018  . Elevated MCV 01/15/2018  . Anemia 01/15/2018  . Genital warts 01/15/2018  . Vitamin D deficiency 01/15/2018  . Pernicious anemia   . Impingement syndrome of shoulder region 12/19/2017  . Multiple joint pain 12/19/2017  . Osteoarthritis of right knee 12/19/2017  . B12 deficiency 12/11/2017  . Lung nodules 12/11/2017  . History of Clostridium difficile infection 10/23/2017  . Factor V Leiden ([place]) 10/19/2017  . Goals of care, counseling/discussion 10/19/2017  . Cervical cancer, FIGO stage IB1 ([place]) 09/18/2017  . Chronic hepatitis C without hepatic coma ([place]) 09/18/2017  . Cytopenia 09/18/2017  . Diarrhea 09/18/2017  . Lower abdominal pain 09/18/2017  . 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 Malignant neoplasm of overlapping sites of cervix ([place]) 09/18/2017  . Renal insufficiency 09/18/2017  . Wound infection after surgery 09/12/2017  . Hypokalemia   . Hypomagnesemia   . Cervical arthritis 07/18/2017  . Dysuria 06/20/2017  . Metastatic cancer ([place]) 05/12/2017  . Essential hypertension 03/15/2017  . Anemia in other chronic diseases classified elsewhere 03/01/2017  . Chemotherapy-induced neutropenia ([place]) 01/29/2017  . 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[place]) 09/25/2016    Past Medical History: Past Medical History:  Diagnosis Date  . Abnormal cervical Papanicolaou smear 09/18/2017  . Anxiety   . Aortic atherosclerosis ([place])   . Arthritis  neck and knees  . 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 Blood transfusion without reported diagnosis   . Cervical cancer ([place]) 09/2016   mets lung  . Chronic anal fissure   . Chronic diarrhea   . Dyspnea   .  Erosive gastropathy 09/18/2017  . Factor V Leiden mutation ([place])   . Fecal incontinence   . Genital warts   . GERD (gastroesophageal reflux disease)   . Heart murmur   . Hemorrhoids   . 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 Hepatitis, chronic ([place]) 05/05/2017  . History of cancer chemotherapy    completed 06/2017  . History of Clostridium difficile infection    while undergoing chemo.  Negative test 10/2017  . Ileocolic anastomotic leak   . 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 Intestinal infection due to Clostridium difficile 09/18/2017  . Macrocytic anemia with vitamin B12 deficiency   . Multiple gastric ulcers   . Nausea vomiting and diarrhea   . Pancolitis ([place]) 07/27/2018  . Perianal condylomata   . Pneumonia    History of  . Pulmonary nodules   . Rectal bleeding   . Small bowel obstruction ([place]) 08/2017  . Stiff neck    limited right turn  . Vitamin D deficiency     Past Surgical History: Past Surgical History:  Procedure Laterality Date  . CHOLECYSTECTOMY    . COLON SURGERY  08/2017   resection  . COLONOSCOPY WITH PROPOFOL N/A 12/20/2017   Procedure: COLONOSCOPY WITH PROPOFOL;  Surgeon: [name], MD;  Location: [facility] ENDOSCOPY;  Service: Gastroenterology;  Laterality: N/A;  . COLONOSCOPY WITH PROPOFOL N/A 07/30/2018   Procedure: COLONOSCOPY WITH PROPOFOL;  Surgeon: [name], MD;  Location: [facility] ENDOSCOPY;  Service: Gastroenterology;  Laterality: N/A;  . COLONOSCOPY WITH PROPOFOL N/A 10/10/2018   Procedure: COLONOSCOPY WITH PROPOFOL;  Surgeon: Lucilla Lame, MD;  Location: [facility] ENDOSCOPY;  Service: Endoscopy;  Laterality: N/A;  . DIAGNOSTIC LAPAROSCOPY    . ESOPHAGOGASTRODUODENOSCOPY (EGD) WITH PROPOFOL N/A 12/20/2017   Procedure: ESOPHAGOGASTRODUODENOSCOPY (EGD) WITH PROPOFOL;  Surgeon: [name], MD;  Location: [place];  Service: Gastroenterology;  Laterality: N/A;  .  ESOPHAGOGASTRODUODENOSCOPY (EGD) WITH PROPOFOL  07/30/2018   Procedure: ESOPHAGOGASTRODUODENOSCOPY (EGD) WITH PROPOFOL;  Surgeon: [name], MD;  Location: [facility] ENDOSCOPY;  Service: Gastroenterology;;  . LAPAROTOMY N/A 08/31/2017   Procedure: EXPLORATORY LAPAROTOMY for SBO, ileocolectomy, removal of piece of uterine wall;  Surgeon: [name], MD;  Location: [facility] ORS;  Service: General;  Laterality: N/A;  . LASER ABLATION CONDOLAMATA N/A 02/22/2018   Procedure: LASER ABLATION/REMOVAL OF ZOXWRUEAVWU AROUND ANUS AND VAGINA;  Surgeon: Michael Boston, MD;  Location: [place];  Service: General;  Laterality: N/A;  . OOPHORECTOMY    . PORTA CATH INSERTION N/A 05/13/2018   Procedure: PORTA CATH INSERTION;  Surgeon: Katha Cabal, MD;  Location: [place] CV LAB;  Service: Cardiovascular;  Laterality: N/A;  . SMALL INTESTINE SURGERY    . TANDEM RING INSERTION     x3  . THORACOTOMY    . TOTAL KNEE ARTHROPLASTY Left 04/24/2018   Procedure: TOTAL KNEE ARTHROPLASTY;  Surgeon: Lovell Sheehan, MD;  Location: [facility] ORS;  Service: Orthopedics;  Laterality: Left;     Family History: Family History  Problem Relation Age of Onset  . Hypertension Father   . Diabetes Father   .  Alcohol abuse Daughter   . Hypertension Maternal Grandmother   . Diabetes Maternal Grandmother   . Diabetes Paternal Grandmother   . Hypertension Paternal Grandmother     Social History: Social History   Socioeconomic History  . Marital status: Divorced    Spouse name: Not on file  . Number of children: Not on file  . Years of education: Not on file  . Highest education level: Not on file  Occupational History  . Not on file  Social Needs  . Financial resource strain: Not hard at all  . Food insecurity:    Worry: Never true    Inability: Never true  . Transportation needs:    Medical: No    Non-medical: No  Tobacco Use  . Smoking status: Former Smoker    Last attempt to quit:  10/16/2006    Years since quitting: 12.0  . Smokeless tobacco: Never Used  Substance and Sexual Activity  . Alcohol use: Not Currently    Frequency: Never    Comment: seldom  . Drug use: Yes    Types: Marijuana  . Sexual activity: Not Currently    Birth control/protection: Post-menopausal    Comment: Not Asked  Lifestyle  . Physical activity:    Days per week: Patient refused    Minutes per session: Patient refused  . Stress: Only a little  Relationships  . Social connections:    Talks on phone: Patient refused    Gets together: Patient refused    Attends religious service: Patient refused    Active member of club or organization: Patient refused    Attends meetings of clubs or organizations: Patient refused    Relationship status: Patient refused  . Intimate partner violence:    Fear of current or ex partner: No    Emotionally abused: No    Physically abused: No    Forced sexual activity: No  Other Topics Concern  . Not on file  Social History Narrative  . Not on file    Allergies: Allergies  Allergen Reactions  . Ketamine Anxiety and Other (See Comments)    Syncope episode/confusion     Current Medications: Current Outpatient Medications  Medication Sig Dispense Refill  . amitriptyline (ELAVIL) 50 MG tablet Take 1 tablet (50 mg total) by mouth at bedtime. 30 tablet 2  . budesonide (ENTOCORT EC) 3 MG 24 hr capsule Take 3 capsules (9 mg total) by mouth daily. 30 capsule 0  . Calcium Carb-Cholecalciferol (CALCIUM 500 +D) 500-400 MG-UNIT TABS Take 2 tablets by mouth daily.    . diphenoxylate-atropine (LOMOTIL) 2.5-0.025 MG tablet Take 2 tablets by mouth 4 (four) times daily. 30 tablet 0  . hydrocortisone (CORTEF) 10 MG tablet Take 10 mg by mouth 3 (three) times daily.    . hyoscyamine (LEVBID) 0.375 MG 12 hr tablet Take 1 tablet (0.375 mg total) by mouth 2 (two) times daily. 60 tablet 0  . Multiple Vitamins-Minerals (MULTIVITAMIN WITH MINERALS) tablet Take 1 tablet by  mouth daily. 30 tablet 0  . octreotide (SANDOSTATIN LAR) 30 MG injection Inject 30 mg into the muscle every 28 (twenty-eight) days. 1 each 0  . ondansetron (ZOFRAN) 8 MG tablet Take 1 tablet (8 mg total) by mouth every 8 (eight) hours as needed for nausea or vomiting. 30 tablet 3  . oxyCODONE (OXY IR/ROXICODONE) 5 MG immediate release tablet Take 1 tablet (5 mg total) by mouth every 6 (six) hours as needed for moderate pain or severe pain. (Patient not taking:  Reported on 10/22/2018) 30 tablet 0  . pantoprazole (PROTONIX) 40 MG tablet Take 1 tablet (40 mg total) by mouth daily. 30 tablet 0  . promethazine (PHENERGAN) 25 MG tablet Take 1 tablet (25 mg total) by mouth every 6 (six) hours as needed for nausea or vomiting. 30 tablet 0  . rifaximin (XIFAXAN) 550 MG TABS tablet Take 1 tablet (550 mg total) by mouth 2 (two) times daily for 14 days. 28 tablet 0  . VENTOLIN HFA 108 (90 Base) MCG/ACT inhaler Inhale 1-2 puffs into the lungs every 4 (four) hours as needed for shortness of breath.   0  . VIBERZI 100 MG TABS TAKE 1 TABLET BY MOUTH TWICE DAILY WITH  A  MEAL 60 tablet 0   No current facility-administered medications for this visit.    Facility-Administered Medications Ordered in Other Visits  Medication Dose Route Frequency Provider Last Rate Last Dose  . heparin lock flush 100 unit/mL  500 Units Intravenous Once Corcoran, Melissa C, MD      . sodium chloride 0.9 % 1,000 mL with potassium chloride 20 mEq infusion   Intravenous Once Karen Kitchens, NP       Review of Systems General:  Fatigue and weakness Skin: no complaints Eyes: no complaints HEENT: no complaints Breasts: no complaints Pulmonary: shortness of breath Cardiac: no complaints Gastrointestinal: abdominal bloating/distention, nausea, diarrhea Genitourinary/Sexual: dysuria Ob/Gyn: vaginal spotting per hpi Musculoskeletal: back pain Hematology: no complaints Neurologic/Psych: depression   Objective:  Physical Examination:   BP 111/75 (BP Location: Left Arm, Patient Position: Sitting)   Pulse 97   Temp (!) 97 F (36.1 C) (Tympanic)   Resp 16   Ht 5' 8"  (1.727 m)   Wt 177 lb 3.2 oz (80.4 kg)   BMI 26.94 kg/m    ECOG Performance Status: 1 - Symptomatic but completely ambulatory  GENERAL: Alert, unaccompanied. Well appearing.  HEENT:  PERRL, neck supple with midline trachea. Alopecia NODES:  No cervical, supraclavicular, axillary, or inguinal lymphadenopathy palpated.  LUNGS:  Clear to auscultation bilaterally.  No wheezes or rhonchi. HEART:  Regular rate and rhythm. No murmur appreciated. ABDOMEN:  Soft, nontender.  Positive, normoactive bowel sounds.  MSK:  No focal spinal tenderness to palpation. Full range of motion bilaterally in the upper extremities. EXTREMITIES:  No peripheral edema.   SKIN:  Clear with no obvious rashes or skin changes. No nail dyscrasia. NEURO:  Nonfocal. Well oriented.  Appropriate affect.  Pelvic: exam chaperoned by nurse; Vulva normal, Vagina now well healed and somewhat shortened. Narrow speculum tolerated well.  Some adhesions in upper vagina were easily broken up with a finger.  Atrophic, mucosa pale, no lesions, but spots easily. Cervix: flush with vault, no lesions. Uterus: not palpable. Bimanual/RV:normal  No masses or nodularity.     Assessment:  Joanna Hall is a 53 y.o. female diagnosed with recurrent stage IB1 cervical adenocarcinoma (PDL1 positive) s/p aborted radical hysterectomy due to infected adnexa 2/18 at [facility] followed by chemo-radiation at [facility] with apparent complete response.  She developed a bowel obstruction and had resection of terminal ileum and cecum 11/18 at [facility].  Imaging from 04/2018 revealed lung metastases. She initiated carboplatin-taxol on 06/04/18. Course complicated by chronic diarrhea and multiple GI issues. Currently s/p cycle 5 on 10/01/18 with imaging showing reduction in lung metastases.   Currently having vaginal spotting. Xarelto on hold  d/t rectal bleeding. Vaginal vault necrosis well healed now. Vagina is atrophic and bleeds lightly with contact, but no necrosis  or cancer presently.  Medical co-morbidities complicating care:untreated Hepatitis C .  Plan:   Problem List Items Addressed This Visit      Genitourinary   Malignant neoplasm of overlapping sites of cervix [facility]) - Primary      Continue carboplatin/taxol chemotherapy with Dr Mike Gip. Bevacizumab not given as patient is on Xarelto for factor V Leiden and renal infarct.     PDL1 positive disease; she would be a candidate for Keytruda in the future if she progresses.    We can see her back in 3-4 months for follow up or sooner if needed.  Beckey Rutter, DNP, AGNP-C [place] at [facility] [phone] (work cell) [phone] (office)  I personally interviewed and examined the patient. Agreed with the above/below plan of care.  Patient/family questions were answered.  Mellody Drown, MD   CC: Dr. Nolon Stalls. [place]

## 2018-10-24 ENCOUNTER — Encounter: Payer: Self-pay | Admitting: Hematology and Oncology

## 2018-10-24 ENCOUNTER — Other Ambulatory Visit: Payer: Self-pay

## 2018-10-24 ENCOUNTER — Ambulatory Visit (INDEPENDENT_AMBULATORY_CARE_PROVIDER_SITE_OTHER): Payer: Medicaid Other | Admitting: Gastroenterology

## 2018-10-24 DIAGNOSIS — R197 Diarrhea, unspecified: Secondary | ICD-10-CM | POA: Diagnosis not present

## 2018-10-24 NOTE — Progress Notes (Signed)
Pt came today for administration of Sandostatin LAR [place] 12m. Pt brought in her own medication for injection.  Injection given in the left hip IM. Pt waited for 15 mins in patient room and tolerated well.   Lot ##[id number] # 11/15/2020

## 2018-10-25 ENCOUNTER — Encounter: Payer: Self-pay | Admitting: Hematology and Oncology

## 2018-10-28 ENCOUNTER — Inpatient Hospital Stay (HOSPITAL_BASED_OUTPATIENT_CLINIC_OR_DEPARTMENT_OTHER): Payer: Medicaid Other | Admitting: Hematology and Oncology

## 2018-10-28 ENCOUNTER — Encounter: Payer: Self-pay | Admitting: Hematology and Oncology

## 2018-10-28 ENCOUNTER — Inpatient Hospital Stay: Payer: Medicaid Other

## 2018-10-28 ENCOUNTER — Ambulatory Visit: Payer: Medicaid Other

## 2018-10-28 VITALS — BP 113/79 | HR 91 | Temp 97.3°F | Resp 18 | Ht 68.0 in | Wt 168.7 lb

## 2018-10-28 DIAGNOSIS — K648 Other hemorrhoids: Secondary | ICD-10-CM

## 2018-10-28 DIAGNOSIS — B1920 Unspecified viral hepatitis C without hepatic coma: Secondary | ICD-10-CM

## 2018-10-28 DIAGNOSIS — E860 Dehydration: Secondary | ICD-10-CM

## 2018-10-28 DIAGNOSIS — D649 Anemia, unspecified: Secondary | ICD-10-CM | POA: Diagnosis not present

## 2018-10-28 DIAGNOSIS — C538 Malignant neoplasm of overlapping sites of cervix uteri: Secondary | ICD-10-CM

## 2018-10-28 DIAGNOSIS — R300 Dysuria: Secondary | ICD-10-CM

## 2018-10-28 DIAGNOSIS — A0472 Enterocolitis due to Clostridium difficile, not specified as recurrent: Secondary | ICD-10-CM

## 2018-10-28 DIAGNOSIS — D6851 Activated protein C resistance: Secondary | ICD-10-CM | POA: Diagnosis not present

## 2018-10-28 DIAGNOSIS — C530 Malignant neoplasm of endocervix: Secondary | ICD-10-CM

## 2018-10-28 DIAGNOSIS — R1030 Lower abdominal pain, unspecified: Secondary | ICD-10-CM

## 2018-10-28 DIAGNOSIS — R197 Diarrhea, unspecified: Secondary | ICD-10-CM

## 2018-10-28 DIAGNOSIS — E871 Hypo-osmolality and hyponatremia: Secondary | ICD-10-CM

## 2018-10-28 DIAGNOSIS — C7800 Secondary malignant neoplasm of unspecified lung: Secondary | ICD-10-CM | POA: Diagnosis not present

## 2018-10-28 DIAGNOSIS — K625 Hemorrhage of anus and rectum: Secondary | ICD-10-CM

## 2018-10-28 DIAGNOSIS — Z7901 Long term (current) use of anticoagulants: Secondary | ICD-10-CM

## 2018-10-28 DIAGNOSIS — Z923 Personal history of irradiation: Secondary | ICD-10-CM

## 2018-10-28 DIAGNOSIS — Z7189 Other specified counseling: Secondary | ICD-10-CM

## 2018-10-28 DIAGNOSIS — Z5111 Encounter for antineoplastic chemotherapy: Secondary | ICD-10-CM | POA: Diagnosis not present

## 2018-10-28 DIAGNOSIS — R748 Abnormal levels of other serum enzymes: Secondary | ICD-10-CM

## 2018-10-28 DIAGNOSIS — Z79899 Other long term (current) drug therapy: Secondary | ICD-10-CM

## 2018-10-28 DIAGNOSIS — E538 Deficiency of other specified B group vitamins: Secondary | ICD-10-CM

## 2018-10-28 DIAGNOSIS — D696 Thrombocytopenia, unspecified: Secondary | ICD-10-CM

## 2018-10-28 LAB — COMPREHENSIVE METABOLIC PANEL
ALT: 49 U/L — ABNORMAL HIGH (ref 0–44)
AST: 34 U/L (ref 15–41)
Albumin: 4 g/dL (ref 3.5–5.0)
Alkaline Phosphatase: 164 U/L — ABNORMAL HIGH (ref 38–126)
Anion gap: 9 (ref 5–15)
BUN: 13 mg/dL (ref 6–20)
CO2: 23 mmol/L (ref 22–32)
Calcium: 9.3 mg/dL (ref 8.9–10.3)
Chloride: 103 mmol/L (ref 98–111)
Creatinine, Ser: 0.78 mg/dL (ref 0.44–1.00)
GFR calc Af Amer: >60 mL/min (ref >60)
GFR calc non Af Amer: >60 mL/min (ref >60)
Glucose, Bld: 106 mg/dL — ABNORMAL HIGH (ref 70–99)
Potassium: 3.6 mmol/L (ref 3.5–5.1)
Sodium: 135 mmol/L (ref 135–145)
Total Bilirubin: 0.8 mg/dL (ref 0.3–1.2)
Total Protein: 9.2 g/dL — ABNORMAL HIGH (ref 6.5–8.1)

## 2018-10-28 LAB — CBC WITH DIFFERENTIAL/PLATELET
Abs Immature Granulocytes: 0.01 10*3/uL (ref 0.00–0.07)
Basophils Absolute: 0 10*3/uL (ref 0.0–0.1)
Basophils Relative: 0 %
Eosinophils Absolute: 0 10*3/uL (ref 0.0–0.5)
Eosinophils Relative: 1 %
HCT: 34.1 % — ABNORMAL LOW (ref 36.0–46.0)
Hemoglobin: 11.4 g/dL — ABNORMAL LOW (ref 12.0–15.0)
Immature Granulocytes: 0 %
Lymphocytes Relative: 19 %
Lymphs Abs: 0.5 10*3/uL — ABNORMAL LOW (ref 0.7–4.0)
MCH: 35 pg — ABNORMAL HIGH (ref 26.0–34.0)
MCHC: 33.4 g/dL (ref 30.0–36.0)
MCV: 104.6 fL — ABNORMAL HIGH (ref 80.0–100.0)
Monocytes Absolute: 0.2 10*3/uL (ref 0.1–1.0)
Monocytes Relative: 6 %
Neutro Abs: 2.2 10*3/uL (ref 1.7–7.7)
Neutrophils Relative %: 74 %
Platelets: 143 10*3/uL — ABNORMAL LOW (ref 150–400)
RBC: 3.26 MIL/uL — ABNORMAL LOW (ref 3.87–5.11)
RDW: 14.4 % (ref 11.5–15.5)
WBC: 2.9 10*3/uL — ABNORMAL LOW (ref 4.0–10.5)
nRBC: 0 % (ref 0.0–0.2)

## 2018-10-28 LAB — MAGNESIUM: Magnesium: 1.5 mg/dL — ABNORMAL LOW (ref 1.7–2.4)

## 2018-10-28 MED ORDER — MAGNESIUM SULFATE 2 GM/50ML IV SOLN
2.0000 g | Freq: Once | INTRAVENOUS | Status: AC
  Administered 2018-10-28: 2 g via INTRAVENOUS
  Filled 2018-10-28: qty 50

## 2018-10-28 MED ORDER — HEPARIN SOD (PORK) LOCK FLUSH 100 UNIT/ML IV SOLN
500.0000 [IU] | Freq: Once | INTRAVENOUS | Status: AC
  Administered 2018-10-28: 500 [IU] via INTRAVENOUS
  Filled 2018-10-28: qty 5

## 2018-10-28 MED ORDER — SODIUM CHLORIDE 0.9 % IV SOLN
INTRAVENOUS | Status: DC
  Administered 2018-10-28: 15:00:00 via INTRAVENOUS
  Filled 2018-10-28: qty 250

## 2018-10-28 MED ORDER — SODIUM CHLORIDE 0.9% FLUSH
10.0000 mL | Freq: Once | INTRAVENOUS | Status: AC | PRN
  Administered 2018-10-28: 10 mL
  Filled 2018-10-28: qty 10

## 2018-10-28 NOTE — Progress Notes (Addendum)
[facility] day:  10/28/2018   Chief Complaint: Joanna Hall is a 53 y.o. female with stage IV cervical cancer who is seen for assessment on day 29 following cycle #5 carboplatin + paclitaxel.   HPI:  The patient was last seen in the medical oncology clinic on 10/21/2018.  At that time, she continued to have ongoing issues with diarrhea.  She feelt poorly.  Exam revealed lower abdominal tenderness without guarding or rebound.  Magnesium was 1.5.  Bilirubin was 1.4.  Platelet count was 95,000.  She received IVF and magnesium.  EKG revealed sinus tachycardia (pulse was 132).  Pain medications were changed to oxycodone.  She was referred for evaluation by GI.  She saw Dr. Marius Ditch on 10/21/2018.  Notes reviewed. Lomotil and tincture of opium were discontinued.  She was to continue colestipol 1 pill each meal, budesonide 3 mg 3 capsules daily, increase amitriptyline to 75 mg at bedtime, continue Viberzi 100 mg twice daily, and continue hydrocortisone 5 mg 3 times daily.  A trial of octreotide IM 24m was discussed as well as an empiric trial of rifaximin (XIfaxan) 550 mg twice daily.  She was to take Levbid 0.375 milligrams twice daily for abdominal cramps.  A capsule study was scheduled.  [place] persisted despite trial of octreotide and pending video capsule endoscopy results.  She saw Dr. LSanda Kleinon 10/22/2018.  Notes reviewed.  She saw Dr. BFransisca Connorson 10/23/2018.  Notes reviewed. She was having vaginal spotting. Xarelto was on hold secondary to rectal bleeding. Vaginal vault necrosis was well healed now. Vagina was atrophic and bled lightly with contact, but no necrosis or cancer presently.  Plan was to continue carboplatin and Taxol.  She was felt to be a candidate for Keytruda if she progresses.  During the interim,she has had some nausea and vomiting.  She received sandostatin IM 20 mg on  10/22/2018.  She is not taking Xifaxan.  She states that her capsule study was cancelled .  She has been on Levbid since 10/23/2018.    Overall, she feels "ok".  She is having diarrhea, but "not as bad".  She has 1-2 watery stools/day.  She denies any blood or mucus.  She denies any bleeding.  She [facility]  She wants to get her teeth pulled.  She states that her new teeth are "made and ready".  She states that she has talked to her dentist.  She needs 3-4 weeks before she can wear dentures.  She will need to eat soft foods.  Her dentist is Dr LOrene Desanctis   Past Medical History:  Diagnosis Date  . Abnormal cervical Papanicolaou smear 09/18/2017  . Anxiety   . Aortic atherosclerosis ([place]   . Arthritis    neck and knees  . 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 Blood transfusion without reported diagnosis   . Cervical cancer ([place] 09/2016   mets lung  . Chronic anal fissure   . Chronic diarrhea   . Dyspnea   . Erosive gastropathy 09/18/2017  . Factor V Leiden mutation ([place]   . Fecal incontinence   . Genital warts   . GERD (gastroesophageal reflux disease)   . Heart murmur   . Hemorrhoids   . 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  Hepatitis, chronic ([place]) 05/05/2017  . History of cancer chemotherapy    completed 06/2017  . History of Clostridium difficile infection    while undergoing chemo.  Negative test 10/2017  . Ileocolic anastomotic leak   . 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 Intestinal infection due to Clostridium difficile 09/18/2017  . Macrocytic anemia with vitamin B12 deficiency   . Multiple gastric ulcers   . Nausea vomiting and diarrhea   . Pancolitis ([place]) 07/27/2018  . Perianal condylomata   . Pneumonia    History of  . Pulmonary nodules   . Rectal bleeding   . Small bowel obstruction ([place]) 08/2017  . Stiff neck    limited right turn  . Vitamin D deficiency     Past  Surgical History:  Procedure Laterality Date  . CHOLECYSTECTOMY    . COLON SURGERY  08/2017   resection  . COLONOSCOPY WITH PROPOFOL N/A 12/20/2017   Procedure: COLONOSCOPY WITH PROPOFOL;  Surgeon: [name], MD;  Location: [facility] ENDOSCOPY;  Service: Gastroenterology;  Laterality: N/A;  . COLONOSCOPY WITH PROPOFOL N/A 07/30/2018   Procedure: COLONOSCOPY WITH PROPOFOL;  Surgeon: [name], MD;  Location: [facility] ENDOSCOPY;  Service: Gastroenterology;  Laterality: N/A;  . COLONOSCOPY WITH PROPOFOL N/A 10/10/2018   Procedure: COLONOSCOPY WITH PROPOFOL;  Surgeon: Lucilla Lame, MD;  Location: [facility] ENDOSCOPY;  Service: Endoscopy;  Laterality: N/A;  . DIAGNOSTIC LAPAROSCOPY    . ESOPHAGOGASTRODUODENOSCOPY (EGD) WITH PROPOFOL N/A 12/20/2017   Procedure: ESOPHAGOGASTRODUODENOSCOPY (EGD) WITH PROPOFOL;  Surgeon: [name], MD;  Location: [place];  Service: Gastroenterology;  Laterality: N/A;  . ESOPHAGOGASTRODUODENOSCOPY (EGD) WITH PROPOFOL  07/30/2018   Procedure: ESOPHAGOGASTRODUODENOSCOPY (EGD) WITH PROPOFOL;  Surgeon: [name], MD;  Location: [facility] ENDOSCOPY;  Service: Gastroenterology;;  . LAPAROTOMY N/A 08/31/2017   Procedure: EXPLORATORY LAPAROTOMY for SBO, ileocolectomy, removal of piece of uterine wall;  Surgeon: [name], MD;  Location: [facility] ORS;  Service: General;  Laterality: N/A;  . LASER ABLATION CONDOLAMATA N/A 02/22/2018   Procedure: LASER ABLATION/REMOVAL OF [id number] AROUND ANUS AND VAGINA;  Surgeon: Michael Boston, MD;  Location: [place];  Service: General;  Laterality: N/A;  . OOPHORECTOMY    . PORTA CATH INSERTION N/A 05/13/2018   Procedure: PORTA CATH INSERTION;  Surgeon: Katha Cabal, MD;  Location: [place] CV LAB;  Service: Cardiovascular;  Laterality: N/A;  . SMALL INTESTINE SURGERY    . TANDEM RING INSERTION     x3  . THORACOTOMY    . TOTAL KNEE ARTHROPLASTY Left 04/24/2018   Procedure: TOTAL KNEE  ARTHROPLASTY;  Surgeon: Lovell Sheehan, MD;  Location: [facility] ORS;  Service: Orthopedics;  Laterality: Left;    Family History  Problem Relation Age of Onset  . Hypertension Father   . Diabetes Father   . Alcohol abuse Daughter   . Hypertension Maternal Grandmother   . Diabetes Maternal Grandmother   . Diabetes Paternal Grandmother   . Hypertension Paternal Grandmother     Social History:  reports that she quit smoking about 12 years ago. She has never used smokeless tobacco. She reports previous alcohol use. She reports current drug use. Drug: Marijuana.  The patient's 58 year old daughter died of alcoholism.  The patient is originally from [place].  She moved from [place] to [place]. She is alone today.   Allergies:  Allergies  Allergen Reactions  . Ketamine Anxiety and Other (See Comments)    Syncope  episode/confusion     Current Medications: Current Outpatient Medications  Medication Sig Dispense Refill  . amitriptyline (ELAVIL) 75 MG tablet Take 75 mg by mouth at bedtime.    . budesonide (ENTOCORT EC) 3 MG 24 hr capsule Take 3 capsules (9 mg total) by mouth daily. 30 capsule 0  . Calcium Carb-Cholecalciferol (CALCIUM 500 +D) 500-400 MG-UNIT TABS Take 2 tablets by mouth daily.    . hydrocortisone (CORTEF) 10 MG tablet Take 10 mg by mouth 3 (three) times daily.    . hyoscyamine (LEVBID) 0.375 MG 12 hr tablet Take 1 tablet (0.375 mg total) by mouth 2 (two) times daily. 60 tablet 0  . Multiple Vitamins-Minerals (MULTIVITAMIN WITH MINERALS) tablet Take 1 tablet by mouth daily. 30 tablet 0  . octreotide (SANDOSTATIN LAR DEPOT) 20 MG injection Inject 20 mg into the muscle every 28 (twenty-eight) days. 1 each 0  . oxyCODONE (OXY IR/ROXICODONE) 5 MG immediate release tablet Take 1 tablet (5 mg total) by mouth every 6 (six) hours as needed for moderate pain or severe pain. 30 tablet 0  . pantoprazole (PROTONIX) 40 MG tablet Take 1 tablet (40 mg total) by mouth daily.  30 tablet 0  . rifaximin (XIFAXAN) 550 MG TABS tablet Take 1 tablet (550 mg total) by mouth 2 (two) times daily for 14 days. 28 tablet 0  . VIBERZI 100 MG TABS TAKE 1 TABLET BY MOUTH TWICE DAILY WITH  A  MEAL 60 tablet 0  . amitriptyline (ELAVIL) 50 MG tablet Take 1 tablet (50 mg total) by mouth at bedtime. (Patient not taking: Reported on 10/28/2018) 30 tablet 2  . diphenoxylate-atropine (LOMOTIL) 2.5-0.025 MG tablet Take 2 tablets by mouth 4 (four) times daily. (Patient not taking: Reported on 10/28/2018) 30 tablet 0  . ondansetron (ZOFRAN) 8 MG tablet Take 1 tablet (8 mg total) by mouth every 8 (eight) hours as needed for nausea or vomiting. (Patient not taking: Reported on 10/28/2018) 30 tablet 3  . promethazine (PHENERGAN) 25 MG tablet Take 1 tablet (25 mg total) by mouth every 6 (six) hours as needed for nausea or vomiting. (Patient not taking: Reported on 10/28/2018) 30 tablet 0  . VENTOLIN HFA 108 (90 Base) MCG/ACT inhaler Inhale 1-2 puffs into the lungs every 4 (four) hours as needed for shortness of breath.   0   No current facility-administered medications for this visit.    Facility-Administered Medications Ordered in Other Visits  Medication Dose Route Frequency Provider Last Rate Last Dose  . heparin lock flush 100 unit/mL  500 Units Intravenous Once ,  C, MD      . sodium chloride 0.9 % 1,000 mL with potassium chloride 20 mEq infusion   Intravenous Once Karen Kitchens, NP         Review of Systems  Constitutional: Positive for weight loss (5 pounds). Negative for chills, diaphoresis, fever and malaise/fatigue (sluggish).       Feels "ok".  HENT: Negative.  Negative for congestion, ear discharge, ear pain, nosebleeds, sinus pain and sore throat.        Wants to have dental extractions and dentures.  Eyes: Negative.  Negative for blurred vision, double vision, photophobia and pain.  Respiratory: Positive for shortness of breath (exertional). Negative for cough,  hemoptysis, sputum production and wheezing.   Cardiovascular: Negative.  Negative for chest pain, palpitations, orthopnea, leg swelling and PND.  Gastrointestinal: Positive for abdominal pain (lower- chronic), diarrhea (1-2 x/day, watery) and nausea. Negative for blood in stool, constipation,  melena and vomiting.       Appetite 25%.  Genitourinary: Negative.  Negative for dysuria, frequency, hematuria and urgency.       No vaginal bleeding.  Musculoskeletal: Negative.  Negative for falls, joint pain and neck pain.  Skin: Negative.  Negative for itching and rash.  Neurological: Negative for dizziness, tingling, tremors, sensory change, speech change, focal weakness, weakness and headaches.  Endo/Heme/Allergies: Negative.  Does not bruise/bleed easily.  Psychiatric/Behavioral: Negative for depression and memory loss. The patient does not have insomnia.   All other systems reviewed and are negative.  Performance status (ECOG): 2  Vital Signs BP 113/79 (BP Location: Right Arm, Patient Position: Sitting)   Pulse 91   Temp (!) 97.3 F (36.3 C) (Tympanic)   Resp 18   Ht _0  (1.727 m)   Wt 168 lb 10.4 oz (76.5 kg)   SpO2 100%   BMI 25.64 kg/m   Physical Exam  Constitutional: She is oriented to person, place, and time. Vital signs are normal.  Non-toxic appearance. No distress.  Chronically fatigued appearing woman sitting in the exam room in no acute distress.  HENT:  Head: Normocephalic and atraumatic. Hair is normal (chemotherapy induced alopecia).  Mouth/Throat: Oropharynx is clear and moist and mucous membranes are normal. No oropharyngeal exudate.  Wearing a pink wrap.  Eyes: Pupils are equal, round, and reactive to light. Conjunctivae and EOM are normal. No scleral icterus.  Neck: Normal range of motion. Neck supple. No JVD present.  Cardiovascular: Normal rate, regular rhythm, normal heart sounds and intact distal pulses. Exam reveals no gallop and no friction rub.  No murmur  heard. Pulmonary/Chest: Effort normal and breath sounds normal. No respiratory distress. She has no wheezes. She has no rales.  Abdominal: Soft. Bowel sounds are normal. She exhibits no distension and no mass. There is abdominal tenderness (slight) in the right lower quadrant, suprapubic area and left lower quadrant. There is no rebound and no guarding.  Musculoskeletal: Normal range of motion.        General: No tenderness or edema.  Lymphadenopathy:    She has no cervical adenopathy.    She has no axillary adenopathy.       Right: No supraclavicular adenopathy present.       Left: No supraclavicular adenopathy present.  Neurological: She is alert and oriented to person, place, and time. Gait normal.  Skin: Skin is warm and dry. No rash noted. She is not diaphoretic. No erythema.  Psychiatric: Mood, affect and judgment normal.  Nursing note and vitals reviewed.   Imaging studies: 06/2017:  PET scan revealed enlarging pulmonary nodules but no evidence of abdominal disease per patient report. 09/13/2017:  Abdomen and pelvic CT revealed a small infarct in the inferior pole of the LEFT kidney and RIGHT uterine infarct. 11/08/2017:  [facility] scan at the [facility] revealed innumerable stable bilateral cavitary pulmonary nodules (up to 8 mm), some of which demonstrate low level metabolic activity.  There were post treatment changes in the pelvis without evidence of suspicious hypermetabolic activity. 12/03/2017:  Abdomen and pelvic CT revealed continued multiple pulmonary nodules in the lung bases concerning for metastatic disease.  There was mild wall thickening of rectosigmoid colon is noted.  There was a stable 1.8 x 1.6 cm mixed fat and soft tissue density noted in right lower quadrant which may represent fat necrosis.  There was a larger low density is noted involving uterine fundus.  12/03/2017:  Pelvic MRI was  unremarkable.   04/15/2018:  Chest, abdomen, and pelvis CT  revealed innumerable (> 100) cavitary nodules scattered in the lungs, moderately enlarging compared to the 11/08/2017 PET-CT, suspicious for metastatic disease.  One index node in the RLL measures 1.0 x 1.1 cm (previously 0.6 x 0.6 cm).  There were no new nodules.  There was an ill-defined wall thickening in the rectosigmoid with surrounding stranding along fascia planes, probably sequela from prior radiation therapy.  There was multilevel lumbar impingement due to spondylosis and degenerative disc disease.  There was heterogeneous enhancement in the uterus (some possibly from prior radiation therapy). 04/23/2018:  PET scan revealed numerous scattered solid and cavitary nodules in the lungs stable increased in size compared to the prior PET-CT from 11/08/2017. Largest nodule was 1.1 cm in the LUL (SUV 1.9).  These demonstrated low-grade metabolic activity up to a maximum SUV of about 2.3, increased from 11/08/2017.  05/23/2018:  Abdomen and pelvic CT revealed colonic wall thickening, consistent with colitis. Considerations include inflammatory or infectious causes. Ischemic causes would be less likely. She is s/p RIGHT hemicolectomy. She had a patent ileocolic anastomosis.  She received a 10 day course of ciprofloxacin and Flagyl.   09/07/2018:  Abdomen and pelvic CT angiogram revealed colitis, mainly affecting lower loops, question radiation colitis if there has been radiotherapy of the patient's cervical cancer.  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  09/30/2018:  Chest CT revealed interval decrease in size and number of metastatic pulmonary nodules.  There were no new/progressive findings in the chest or upper abdomen   Appointment on 10/28/2018  Component Date Value Ref Range Status  . Magnesium 10/28/2018 1.5* 1.7 - 2.4 mg/dL Final   Performed at [facility], [address], [place], [place] [address]  . WBC 10/28/2018 2.9* 4.0 - 10.5 K/uL Final  . RBC 10/28/2018 3.26* 3.87 - 5.11  MIL/uL Final  . Hemoglobin 10/28/2018 11.4* 12.0 - 15.0 g/dL Final  . HCT 10/28/2018 34.1* 36.0 - 46.0 % Final  . MCV 10/28/2018 104.6* 80.0 - 100.0 fL Final  . MCH 10/28/2018 35.0* 26.0 - 34.0 pg Final  . MCHC 10/28/2018 33.4  30.0 - 36.0 g/dL Final  . RDW 10/28/2018 14.4  11.5 - 15.5 % Final  . Platelets 10/28/2018 143* 150 - 400 K/uL Final  . nRBC 10/28/2018 0.0  0.0 - 0.2 % Final  . Neutrophils Relative % 10/28/2018 74  % Final  . Neutro Abs 10/28/2018 2.2  1.7 - 7.7 K/uL Final  . Lymphocytes Relative 10/28/2018 19  % Final  . Lymphs Abs 10/28/2018 0.5* 0.7 - 4.0 K/uL Final  . Monocytes Relative 10/28/2018 6  % Final  . Monocytes Absolute 10/28/2018 0.2  0.1 - 1.0 K/uL Final  . Eosinophils Relative 10/28/2018 1  % Final  . Eosinophils Absolute 10/28/2018 0.0  0.0 - 0.5 K/uL Final  . Basophils Relative 10/28/2018 0  % Final  . Basophils Absolute 10/28/2018 0.0  0.0 - 0.1 K/uL Final  . Immature Granulocytes 10/28/2018 0  % Final  . Abs Immature Granulocytes 10/28/2018 0.01  0.00 - 0.07 K/uL Final   Performed at [facility], [address], [place], [place] [address]  . Sodium 10/28/2018 135  135 - 145 mmol/L Final  . Potassium 10/28/2018 3.6  3.5 - 5.1 mmol/L Final  . Chloride 10/28/2018 103  98 - 111 mmol/L Final  . CO2 10/28/2018 23  22 - 32 mmol/L Final  . Glucose, Bld 10/28/2018 106* 70 -  99 mg/dL Final  . BUN 10/28/2018 13  6 - 20 mg/dL Final  . Creatinine, Ser 10/28/2018 0.78  0.44 - 1.00 mg/dL Final  . Calcium 10/28/2018 9.3  8.9 - 10.3 mg/dL Final  . Total Protein 10/28/2018 9.2* 6.5 - 8.1 g/dL Final  . Albumin 10/28/2018 4.0  3.5 - 5.0 g/dL Final  . AST 10/28/2018 34  15 - 41 U/L Final  . ALT 10/28/2018 49* 0 - 44 U/L Final  . Alkaline Phosphatase 10/28/2018 164* 38 - 126 U/L Final  . Total Bilirubin 10/28/2018 0.8  0.3 - 1.2 mg/dL Final  . GFR calc non Af Amer 10/28/2018 >60  >60 mL/min Final  . GFR calc Af Amer 10/28/2018 >60  >60 mL/min Final  . Anion  gap 10/28/2018 9  5 - 15 Final   Performed at [facility], [address], [place], [place] [address]    Assessment:  Joanna Hall is a 53 y.o. female with a history of stage IB1 cervical cancer s/p concurrent cisplatin and radiation (external beam and brachytherapy) from 11/2016 - 02/2017.    She was treated by Dr. Christene Slates at [facility] in [place], [place].  In 01/2017, she received cisplatin x 2 and carboplatin x 1 (01/29/2017) due to ARF and XRT. Radiation was followed by T & O on 02/01/2017 and T & N on 02/10/2017 and 02/20/2017.  Course was complicated by weight loss (80 pounds), nausea, vomiting, electrolyte wasting (potassium and magnesium), and multiple hospitalizations.  PET scan in 06/2017 revealed enlarging pulmonary nodules but no evidence of abdominal disease per patient report.  She is s/p right ileocolectomy on 08/31/2017 for a small bowel obstruction.  She presented with abdominal pain.  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 PDL-1 testing reveals a CPS score of 15.  Chest, abdomen, and pelvis CT on 04/15/2018 revealed innumerable (> 100) cavitary nodules scattered in the lungs, moderately enlarging compared to the 11/08/2017 PET-CT, suspicious for metastatic disease. One index node in the RLL measures 1.0 x 1.1 cm.  There were no new nodules.  There was an ill-defined wall thickening in the rectosigmoid with surrounding stranding along fascia planes, probably sequela from prior radiation therapy.  There was multilevel lumbar impingement due to spondylosis and degenerative disc disease.  There was heterogeneous enhancement in the uterus (some possibly from prior radiation therapy).  PET scan on 04/23/2018 revealed numerous scattered solid and cavitary nodules in the lungs stable increased in  size compared to the prior PET-CT from 11/08/2017. Largest nodule was 1.1 cm in the LUL (SUV 1.9).  These demonstrated low-grade metabolic activity up to a maximum SUV of about 2.3, increased from 11/08/2017.   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  She is day 29 s/p cycle # 5 cycle of carboplatin and Taxol (06/04/2018 - 09/30/2018) with Margarette [place] support.    Chest CT on 09/30/2018 revealed interval decrease in size and number of metastatic pulmonary nodules.  There were no new/progressive findings in the chest or upper abdomen  Hypercoagulable work-up on 09/14/2017 revealed heterozygosity for Factor V Leiden (single [id number] mutation).  Testing was negative for prothrombin gene mutation, lupus anticoagulant panel, anticardiolipin antibodies, and beta-2 glycoprotein antibodies.  She was discharged on Xarelto.  She was admitted to  [place] from 12/03/2017 - 12/08/2017 with abdominal pain. She was not felt to have a uterine infarct.  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  The etiology was unclear.  She was admitted to [facility] from 10/08/2018 - 10/11/2018 for rectal bleeding. Hemoglobin dropped from 13 to 8.9 in 4 days. Platelets had decreased to 81,000; chronic anticoagulation (Xarelto) was held. She received 2 units of PRBCs. Repeat stool studies were negative for diarrhea of infectious etiology. Colonoscopy on 10/10/2018 revealed 3 ulcers in the terminal ileum (no evidence of dysplasia or malignancy). Throughout the course of her admission, platelet count was monitored (dropped as low as 61,000).   She was admitted to [facility] from 10/17/2018 - 10/18/2018 at [facility] abdominal pain, nausea and vomiting.  Abdomen and pelvis CT suggested a partial small bowel obstruction.  She was seen by general surgery and had no evidence of bowel obstruction clinically as she had a bowel movement was passing  flatus and had a benign abdomen.  She is hepatitis C positive.   Hepatitis C genotype is 2a/2c.  She has B12 deficiency.  B12 was 165 on 11/27/2017.  She began B12 injections on 11/29/2017.  B12 injections occur monthly at home.  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  She was diagnosed with colitis.  Abdomen and pelvic CT on 05/23/2018 revealed colonic wall thickening, consistent with colitis. Considerations include inflammatory or infectious causes. Ischemic causes would be less likely. She is s/p RIGHT hemicolectomy. She had a patent ileocolic anastomosis.  She received a 10 day course of ciprofloxacin and Flagyl.  Abdomen and pelvic CT angiogram on 09/07/2018 revealed colitis, mainly affecting lower loops, question radiation colitis if there has been radiotherapy of the patient's cervical cancer.  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   She has  chronic diarrhea s/p right hemicolectomy.  She began sandostatin 20 mg IM q month on 10/22/2018.  She began Levbid on 10/23/2018.   She has adrenal insufficiency.  She is on hydrocortisone.  Symptomatically, she continues to have watery diarrhea 1-2x/day.  She remains fatigued.  She denies any bleeding.  Platelets are 143,000.  Plan: 1. Labs today: CBC with diff, CMP, Mg 2. Metastatic cervical cancer Day 29 s/p last cycle of carboplatin + paclitaxel with Neulasta support. Review interval follow-up with Gyn-Onc. Continue to hold  chemotherapy secondary to ongoing GI issues. 3. Anemia and thrombocytopenia Labs reviewed. Hematocrit 34.1 and hemoglobin 11.4. Platelets 143,000. Discuss reinitiation of rivaroxaban.  Discuss plan to hold for 2 days prior to dental procedure. 4. Vaginal bleeding  No further bleeding.  Patient seen by Gyn-Onc on 10/23/2018.  Vagina atrophic with bleeding on contact. 5. History of rectal bleeding Recent colonoscopy revealed 3 ulcers noted at the terminal ileum.  Pathology was negative for high  grade dysplasia or malignancy.  Concern for possible ulcerative colitis or Crohn's disease. Review interval GI note.  Patient has been [facility]. 6. Nausea and diarrhea Ongoing issue. Patient now on sandostatin monthly. Patient has been referred to [facility]. 7. Electrolyte derangements Labs reviewed. BUN 13 and creatinine 0.78.  Potassium 3.6.  Magnesium  1.5. Magnesium 2 gm IV today.  8. Dental issues  Patient wishes to have remaining teeth removed prior to dentures.  Discuss need for antibiotics prior to extraction secondary to indwelling port-a-cath.  Discuss holding Xarelto x 2 days and restarting per dentist. 9.   RTC in 1 week for MD assessment and labs (CBC with diff, BMP, Mg), and IV magnesium.     Elmarie Mainland, MD  10/28/2018, 2:50 PM

## 2018-10-29 ENCOUNTER — Encounter: Payer: Self-pay | Admitting: Gastroenterology

## 2018-10-29 ENCOUNTER — Encounter: Payer: Self-pay | Admitting: Hematology and Oncology

## 2018-10-30 ENCOUNTER — Encounter: Payer: Self-pay | Admitting: Urgent Care

## 2018-10-30 ENCOUNTER — Telehealth: Payer: Self-pay

## 2018-10-30 ENCOUNTER — Other Ambulatory Visit: Payer: Self-pay

## 2018-10-30 ENCOUNTER — Other Ambulatory Visit: Payer: Self-pay | Admitting: Urgent Care

## 2018-10-30 ENCOUNTER — Ambulatory Visit: Payer: Medicaid Other

## 2018-10-30 DIAGNOSIS — M67472 Ganglion, left ankle and foot: Secondary | ICD-10-CM

## 2018-10-30 DIAGNOSIS — R200 Anesthesia of skin: Secondary | ICD-10-CM

## 2018-10-30 MED ORDER — PANTOPRAZOLE SODIUM 40 MG PO TBEC: 40.0000 mg | DELAYED_RELEASE_TABLET | Freq: Every day | ORAL | 0 refills | Status: DC

## 2018-10-30 MED ORDER — OXYCODONE HCL 5 MG PO TABS: 5.0000 mg | ORAL_TABLET | Freq: Four times a day (QID) | ORAL | 0 refills | Status: DC | PRN

## 2018-10-30 NOTE — Telephone Encounter (Signed)
Referral entered  

## 2018-10-30 NOTE — Telephone Encounter (Signed)
Please enter referral and I'll sign

## 2018-10-30 NOTE — Telephone Encounter (Signed)
Copied from [phone] [phone]. Topic: Referral - Request for Referral >> Oct 29, 2018  4:53 PM Alanda Slim E wrote: Has patient seen PCP for this complaint? No  *If NO, is insurance requiring patient see PCP for this issue before PCP can refer them? no Referral for which specialty: Podiatrist Preferred provider/office: [facility] - Dr. Samara Deist Reason for referral: Non drainable Cyst on top of left foot/ numb foot

## 2018-10-31 ENCOUNTER — Encounter: Payer: Self-pay | Admitting: Family Medicine

## 2018-11-04 ENCOUNTER — Other Ambulatory Visit: Payer: Self-pay

## 2018-11-04 DIAGNOSIS — K529 Noninfective gastroenteritis and colitis, unspecified: Secondary | ICD-10-CM

## 2018-11-04 NOTE — Progress Notes (Signed)
[facility] referral has been resent, pt has been notified and verbalized understanding

## 2018-11-05 ENCOUNTER — Ambulatory Visit: Payer: Medicaid Other | Admitting: Gastroenterology

## 2018-11-06 ENCOUNTER — Inpatient Hospital Stay: Payer: Medicaid Other

## 2018-11-06 ENCOUNTER — Other Ambulatory Visit: Payer: Self-pay | Admitting: Urgent Care

## 2018-11-06 ENCOUNTER — Ambulatory Visit: Payer: Medicaid Other | Admitting: Gastroenterology

## 2018-11-06 ENCOUNTER — Inpatient Hospital Stay (HOSPITAL_BASED_OUTPATIENT_CLINIC_OR_DEPARTMENT_OTHER): Payer: Medicaid Other | Admitting: Hematology and Oncology

## 2018-11-06 VITALS — BP 120/78 | HR 68 | Temp 97.7°F | Resp 18 | Wt 172.2 lb

## 2018-11-06 DIAGNOSIS — R748 Abnormal levels of other serum enzymes: Secondary | ICD-10-CM

## 2018-11-06 DIAGNOSIS — D649 Anemia, unspecified: Secondary | ICD-10-CM

## 2018-11-06 DIAGNOSIS — B1920 Unspecified viral hepatitis C without hepatic coma: Secondary | ICD-10-CM

## 2018-11-06 DIAGNOSIS — Z923 Personal history of irradiation: Secondary | ICD-10-CM

## 2018-11-06 DIAGNOSIS — D6851 Activated protein C resistance: Secondary | ICD-10-CM

## 2018-11-06 DIAGNOSIS — R300 Dysuria: Secondary | ICD-10-CM

## 2018-11-06 DIAGNOSIS — E871 Hypo-osmolality and hyponatremia: Secondary | ICD-10-CM

## 2018-11-06 DIAGNOSIS — E860 Dehydration: Secondary | ICD-10-CM

## 2018-11-06 DIAGNOSIS — K625 Hemorrhage of anus and rectum: Secondary | ICD-10-CM

## 2018-11-06 DIAGNOSIS — C538 Malignant neoplasm of overlapping sites of cervix uteri: Secondary | ICD-10-CM | POA: Diagnosis not present

## 2018-11-06 DIAGNOSIS — C7800 Secondary malignant neoplasm of unspecified lung: Secondary | ICD-10-CM

## 2018-11-06 DIAGNOSIS — R197 Diarrhea, unspecified: Secondary | ICD-10-CM

## 2018-11-06 DIAGNOSIS — E538 Deficiency of other specified B group vitamins: Secondary | ICD-10-CM

## 2018-11-06 DIAGNOSIS — Z79899 Other long term (current) drug therapy: Secondary | ICD-10-CM

## 2018-11-06 DIAGNOSIS — D696 Thrombocytopenia, unspecified: Secondary | ICD-10-CM

## 2018-11-06 DIAGNOSIS — Z7189 Other specified counseling: Secondary | ICD-10-CM

## 2018-11-06 DIAGNOSIS — C530 Malignant neoplasm of endocervix: Secondary | ICD-10-CM

## 2018-11-06 DIAGNOSIS — E876 Hypokalemia: Secondary | ICD-10-CM

## 2018-11-06 DIAGNOSIS — K648 Other hemorrhoids: Secondary | ICD-10-CM

## 2018-11-06 DIAGNOSIS — E2740 Unspecified adrenocortical insufficiency: Secondary | ICD-10-CM

## 2018-11-06 DIAGNOSIS — Z5111 Encounter for antineoplastic chemotherapy: Secondary | ICD-10-CM | POA: Diagnosis not present

## 2018-11-06 DIAGNOSIS — A0472 Enterocolitis due to Clostridium difficile, not specified as recurrent: Secondary | ICD-10-CM

## 2018-11-06 LAB — CBC WITH DIFFERENTIAL/PLATELET
Abs Immature Granulocytes: 0.01 10*3/uL (ref 0.00–0.07)
Basophils Absolute: 0 10*3/uL (ref 0.0–0.1)
Basophils Relative: 0 %
Eosinophils Absolute: 0 10*3/uL (ref 0.0–0.5)
Eosinophils Relative: 1 %
HCT: 30.4 % — ABNORMAL LOW (ref 36.0–46.0)
Hemoglobin: 10.3 g/dL — ABNORMAL LOW (ref 12.0–15.0)
Immature Granulocytes: 0 %
Lymphocytes Relative: 34 %
Lymphs Abs: 1 10*3/uL (ref 0.7–4.0)
MCH: 35 pg — ABNORMAL HIGH (ref 26.0–34.0)
MCHC: 33.9 g/dL (ref 30.0–36.0)
MCV: 103.4 fL — ABNORMAL HIGH (ref 80.0–100.0)
Monocytes Absolute: 0.2 10*3/uL (ref 0.1–1.0)
Monocytes Relative: 6 %
Neutro Abs: 1.8 10*3/uL (ref 1.7–7.7)
Neutrophils Relative %: 59 %
Platelets: 133 10*3/uL — ABNORMAL LOW (ref 150–400)
RBC: 2.94 MIL/uL — ABNORMAL LOW (ref 3.87–5.11)
RDW: 14.8 % (ref 11.5–15.5)
WBC: 3.1 10*3/uL — ABNORMAL LOW (ref 4.0–10.5)
nRBC: 0 % (ref 0.0–0.2)

## 2018-11-06 LAB — BASIC METABOLIC PANEL
Anion gap: 11 (ref 5–15)
BUN: 13 mg/dL (ref 6–20)
CO2: 24 mmol/L (ref 22–32)
Calcium: 8.7 mg/dL — ABNORMAL LOW (ref 8.9–10.3)
Chloride: 99 mmol/L (ref 98–111)
Creatinine, Ser: 0.84 mg/dL (ref 0.44–1.00)
GFR calc Af Amer: >60 mL/min (ref >60)
GFR calc non Af Amer: >60 mL/min (ref >60)
Glucose, Bld: 90 mg/dL (ref 70–99)
Potassium: 3.2 mmol/L — ABNORMAL LOW (ref 3.5–5.1)
Sodium: 134 mmol/L — ABNORMAL LOW (ref 135–145)

## 2018-11-06 LAB — FERRITIN: Ferritin: 519 ng/mL — ABNORMAL HIGH (ref 11–307)

## 2018-11-06 LAB — MAGNESIUM: Magnesium: 1.3 mg/dL — ABNORMAL LOW (ref 1.7–2.4)

## 2018-11-06 MED ORDER — SODIUM CHLORIDE 0.9% FLUSH
10.0000 mL | INTRAVENOUS | Status: DC | PRN
  Administered 2018-11-06: 10 mL via INTRAVENOUS
  Filled 2018-11-06: qty 10

## 2018-11-06 MED ORDER — POTASSIUM CHLORIDE CRYS ER 20 MEQ PO TBCR: 20.0000 meq | EXTENDED_RELEASE_TABLET | Freq: Every day | ORAL | 0 refills | Status: DC

## 2018-11-06 MED ORDER — HEPARIN SOD (PORK) LOCK FLUSH 100 UNIT/ML IV SOLN
500.0000 [IU] | Freq: Once | INTRAVENOUS | Status: AC
  Administered 2018-11-06: 500 [IU] via INTRAVENOUS

## 2018-11-06 MED ORDER — SODIUM CHLORIDE 0.9 % IV SOLN
Freq: Once | INTRAVENOUS | Status: AC
  Administered 2018-11-06: 10:00:00 via INTRAVENOUS
  Filled 2018-11-06: qty 250

## 2018-11-06 MED ORDER — SODIUM CHLORIDE 0.9 % IV SOLN
Freq: Once | INTRAVENOUS | Status: AC
  Administered 2018-11-06: 10:00:00 via INTRAVENOUS
  Filled 2018-11-06: qty 10

## 2018-11-06 MED ORDER — HEPARIN SOD (PORK) LOCK FLUSH 100 UNIT/ML IV SOLN
INTRAVENOUS | Status: AC
  Filled 2018-11-06: qty 5

## 2018-11-06 NOTE — Progress Notes (Signed)
[facility] day:  11/06/2018   Chief Complaint: Joanna Hall is a 53 y.o. female with stage IV cervical cancer who is seen for assessment on day 38 following cycle #5 carboplatin + paclitaxel.   HPI:  The patient was last seen in the medical oncology clinic on 10/28/2018.  At that time, she continued to have watery diarrhea 1-2x/day.  She remained fatigued.  She denied any bleeding.  Platelets were 143,000.  Xarelto was restarted.  Magnesium was 1.5.  She received 2 gm of magnesium.  She had started sandostatin monthly on 10/22/2018.  We discussed upcoming dental extractions prior to dentures.  She had her teeth extracted yesterday.  She has had no bleeding.  She has a follow-up with her dentist next Tuesday.  She plans to restart her Xarelto today.  During the interim, she has had no rectal or vaginal bleeding.  She descibes emesis and diarrhea on Saturday.  She notes dark/black emesis as well as dark stool.  She has had no further episodes.  She notes no change in stool output/diarrhea s/p sandostatin.  She describes "pooping all day yesterday".  Last EGD on 07/30/2018 by Dr Marius Ditch revealed an erythematous mucosa in the cardia, bilious gastric fluid, and LA grade A reflux esophagitis.   Past Medical History:  Diagnosis Date  . Abdominal pain 06/10/2018  . Abnormal cervical Papanicolaou smear 09/18/2017  . Anxiety   . Aortic atherosclerosis ([place])   . Arthritis    neck and knees  . 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 Blood transfusion without reported diagnosis   . Cervical cancer ([place]) 09/2016   mets lung  . Chronic anal fissure   . Chronic diarrhea   . Dyspnea   . Erosive gastropathy 09/18/2017  . Factor V Leiden mutation ([place])   . Fecal incontinence   . Genital warts   . GERD (gastroesophageal reflux disease)   . Heart murmur   . Hematochezia   . Hemorrhoids   . 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 Hepatitis, chronic ([place]) 05/05/2017  . History of cancer chemotherapy    completed 06/2017  . History of Clostridium difficile infection    while undergoing chemo.  Negative test 10/2017  . Ileocolic anastomotic leak   . 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 Intestinal infection due to Clostridium difficile 09/18/2017  . Macrocytic anemia with vitamin B12 deficiency   . Multiple gastric ulcers   . Nausea vomiting and diarrhea   . Pancolitis ([place]) 07/27/2018  . Perianal condylomata   . Pneumonia    History of  . Pulmonary nodules   . Rectal bleeding   . Small bowel obstruction ([place]) 08/2017  . Stiff neck    limited right turn  . Vitamin D deficiency     Past Surgical History:  Procedure Laterality Date  . CHOLECYSTECTOMY    . COLON SURGERY  08/2017   resection  . COLONOSCOPY WITH PROPOFOL N/A 12/20/2017   Procedure: COLONOSCOPY WITH PROPOFOL;  Surgeon: [name], MD;  Location: [facility] ENDOSCOPY;  Service: Gastroenterology;  Laterality: N/A;  . COLONOSCOPY WITH PROPOFOL N/A 07/30/2018   Procedure: COLONOSCOPY WITH PROPOFOL;  Surgeon: [name], MD;  Location: [facility] ENDOSCOPY;  Service: Gastroenterology;  Laterality: N/A;  . COLONOSCOPY WITH PROPOFOL N/A 10/10/2018   Procedure: COLONOSCOPY WITH PROPOFOL;  Surgeon: Lucilla Lame, MD;  Location: [facility] ENDOSCOPY;  Service: Endoscopy;  Laterality: N/A;  . DIAGNOSTIC LAPAROSCOPY    . ESOPHAGOGASTRODUODENOSCOPY (EGD) WITH PROPOFOL N/A 12/20/2017   Procedure: ESOPHAGOGASTRODUODENOSCOPY (EGD) WITH PROPOFOL;  Surgeon: [name], MD;  Location: [facility];  Service: Gastroenterology;  Laterality: N/A;  . ESOPHAGOGASTRODUODENOSCOPY (EGD) WITH PROPOFOL  07/30/2018   Procedure: ESOPHAGOGASTRODUODENOSCOPY (EGD) WITH PROPOFOL;  Surgeon: [name], MD;  Location: [facility] ENDOSCOPY;  Service: Gastroenterology;;  . LAPAROTOMY N/A 08/31/2017   Procedure: EXPLORATORY LAPAROTOMY for  SBO, ileocolectomy, removal of piece of uterine wall;  Surgeon: [name], MD;  Location: [facility] ORS;  Service: General;  Laterality: N/A;  . LASER ABLATION CONDOLAMATA N/A 02/22/2018   Procedure: LASER ABLATION/REMOVAL OF PXTGGYIRSWN AROUND ANUS AND VAGINA;  Surgeon: Michael Boston, MD;  Location: [facility];  Service: General;  Laterality: N/A;  . OOPHORECTOMY    . PORTA CATH INSERTION N/A 05/13/2018   Procedure: PORTA CATH INSERTION;  Surgeon: Katha Cabal, MD;  Location: [facility];  Service: Cardiovascular;  Laterality: N/A;  . SMALL INTESTINE SURGERY    . TANDEM RING INSERTION     x3  . THORACOTOMY    . TOTAL KNEE ARTHROPLASTY Left 04/24/2018   Procedure: TOTAL KNEE ARTHROPLASTY;  Surgeon: Lovell Sheehan, MD;  Location: [facility] ORS;  Service: Orthopedics;  Laterality: Left;    Family History  Problem Relation Age of Onset  . Hypertension Father   . Diabetes Father   . Alcohol abuse Daughter   . Hypertension Maternal Grandmother   . Diabetes Maternal Grandmother   . Diabetes Paternal Grandmother   . Hypertension Paternal Grandmother     Social History:  reports that she quit smoking about 12 years ago. She has never used smokeless tobacco. She reports previous alcohol use. She reports current drug use. Drug: Marijuana.  The patient's 69 year old daughter died of alcoholism.  The patient is originally from [place].  She moved from [place] to [place]. She is alone today.   Allergies:  Allergies  Allergen Reactions  . Ketamine Anxiety and Other (See Comments)    Syncope episode/confusion     Current Medications: Current Outpatient Medications  Medication Sig Dispense Refill  . budesonide (ENTOCORT EC) 3 MG 24 hr capsule Take 3 capsules (9 mg total) by mouth daily. 30 capsule 0  . Calcium Carb-Cholecalciferol (CALCIUM 500 +D) 500-400 MG-UNIT TABS Take 2 tablets by mouth daily.    . diphenoxylate-atropine (LOMOTIL) 2.5-0.025 MG  tablet Take 2 tablets by mouth 4 (four) times daily. 30 tablet 0  . hyoscyamine (LEVBID) 0.375 MG 12 hr tablet Take 1 tablet (0.375 mg total) by mouth 2 (two) times daily. 60 tablet 0  . Multiple Vitamins-Minerals (MULTIVITAMIN WITH MINERALS) tablet Take 1 tablet by mouth daily. 30 tablet 0  . octreotide (SANDOSTATIN LAR DEPOT) 20 MG injection Inject 20 mg into the muscle every 28 (twenty-eight) days. 1 each 0  . pantoprazole (PROTONIX) 40 MG tablet Take 1 tablet (40 mg total) by mouth daily. 90 tablet 0  . promethazine (PHENERGAN) 25 MG tablet Take 1 tablet (25 mg total) by mouth every 6 (six) hours as needed for nausea or vomiting. 30 tablet 0  . VENTOLIN HFA 108 (90 Base) MCG/ACT inhaler Inhale 1-2 puffs into the lungs every 4 (four) hours as needed for shortness of breath.   0  . VIBERZI 100 MG TABS TAKE 1 TABLET BY MOUTH TWICE DAILY WITH  A  MEAL 60 tablet 0  . amitriptyline (ELAVIL) 75 MG tablet Take 1 tablet (75 mg total) by mouth at bedtime. 90 tablet 1  . hydrocortisone (CORTEF) 10 MG tablet Take 1 tablet (10 mg total) by mouth 2 (two) times daily. 180 tablet 0  . ondansetron (ZOFRAN) 8 MG tablet Take 1 tablet (8 mg total) by mouth every 8 (eight) hours as needed for nausea or vomiting. 30 tablet 1  . oxyCODONE (OXY IR/ROXICODONE) 5 MG immediate release tablet Take 1 tablet (5 mg total) by mouth every 6 (six) hours as needed for moderate pain or severe pain. 30 tablet 0  . potassium chloride SA (K-DUR,KLOR-CON) 20 MEQ tablet Take 1 tablet (20 mEq total) by mouth daily. for 4 days. 30 tablet 0   No current facility-administered medications for this visit.    Facility-Administered Medications Ordered in Other Visits  Medication Dose Route Frequency Provider Last Rate Last Dose  . heparin lock flush 100 unit/mL  500 Units Intravenous Once Corcoran, Melissa C, MD      . sodium chloride 0.9 % 1,000 mL with potassium chloride 20 mEq infusion   Intravenous Once Karen Kitchens, NP          Review of Systems  Constitutional: Negative for chills, diaphoresis, fever, malaise/fatigue and weight loss (up 4 pounds).       Feels "pretty good".  HENT: Negative.  Negative for congestion, ear discharge, ear pain, nosebleeds, sinus pain, sore throat and tinnitus.        S/p full dental extraction.  Eyes: Negative.  Negative for blurred vision, double vision, photophobia and pain.  Respiratory: Positive for shortness of breath (exertional). Negative for cough, hemoptysis, sputum production and wheezing.   Cardiovascular: Negative.  Negative for chest pain, palpitations, orthopnea, leg swelling and PND.  Gastrointestinal: Positive for abdominal pain (chronic lower abdominal), diarrhea (intermittent watery stool), nausea and vomiting (black emesis on Saturday). Negative for blood in stool, constipation and melena.       Appetite 25%.  Genitourinary: Negative.  Negative for dysuria, frequency, hematuria and urgency.       No vaginal bleeding.  Urinary incontinence at times.  Musculoskeletal: Negative.  Negative for back pain, falls, joint pain, myalgias and neck pain.  Skin: Negative.  Negative for itching and rash.  Neurological: Negative.  Negative for dizziness, tingling, tremors, sensory change, speech change, focal weakness, weakness and headaches.  Endo/Heme/Allergies: Negative.  Does not bruise/bleed easily.  Psychiatric/Behavioral: Negative.  Negative for depression and memory loss. The patient does not have insomnia.   All other systems reviewed and are negative.  Performance status (ECOG): 1-2  Vital Signs BP 120/78 (BP Location: Right Arm, Patient Position: Sitting)   Pulse 68   Temp 97.7 F (36.5 C) (Oral)   Resp 18   Wt 172 lb 2.9 oz (78.1 kg)   SpO2 100%   BMI 26.18 kg/m   Physical Exam  Constitutional: She is oriented to person, place, and time. Vital signs are normal.  Non-toxic appearance. No distress.  Fatigued appearing woman sitting in the exam room in no acute  distress.  HENT:  Head: Normocephalic and atraumatic. Hair is normal (chemotherapy induced alopecia).  Mouth/Throat: Oropharynx is clear and moist and mucous membranes are normal. Abnormal dentition (edentulous s/p multiple dental extractions). No oropharyngeal exudate.  Wearing light gray cap.  Eyes: Pupils are equal, round, and reactive to light. Conjunctivae and EOM are normal. No scleral icterus.  Neck: Normal range of motion. Neck supple. No  JVD present.  Cardiovascular: Normal rate, regular rhythm, normal heart sounds and intact distal pulses. Exam reveals no gallop and no friction rub.  No murmur heard. Pulmonary/Chest: Effort normal and breath sounds normal. No respiratory distress. She has no wheezes. She has no rales.  Abdominal: Soft. Bowel sounds are normal. She exhibits no distension and no mass. There is no hepatosplenomegaly. There is abdominal tenderness (chronic mild) in the right lower quadrant and left lower quadrant. There is no rebound and no guarding.  Musculoskeletal: Normal range of motion.        General: No tenderness or edema.  Lymphadenopathy:    She has no cervical adenopathy.    She has no axillary adenopathy.       Right: No supraclavicular adenopathy present.       Left: No supraclavicular adenopathy present.  Neurological: She is alert and oriented to person, place, and time. Gait normal.  Skin: Skin is warm and dry. No rash noted. She is not diaphoretic. No erythema. No pallor.  Psychiatric: Mood, affect and judgment normal.  Nursing note and vitals reviewed.   Imaging studies: 06/2017:  PET scan revealed enlarging pulmonary nodules but no evidence of abdominal disease per patient report. 09/13/2017:  Abdomen and pelvic CT revealed a small infarct in the inferior pole of the LEFT kidney and RIGHT uterine infarct. 11/08/2017:  PET scan at the [place] [facility] revealed innumerable stable bilateral cavitary pulmonary nodules (up to 8 mm),  some of which demonstrate low level metabolic activity.  There were post treatment changes in the pelvis without evidence of suspicious hypermetabolic activity. 12/03/2017:  Abdomen and pelvic CT revealed continued multiple pulmonary nodules in the lung bases concerning for metastatic disease.  There was mild wall thickening of rectosigmoid colon is noted.  There was a stable 1.8 x 1.6 cm mixed fat and soft tissue density noted in right lower quadrant which may represent fat necrosis.  There was a larger low density is noted involving uterine fundus.  12/03/2017:  Pelvic MRI was unremarkable.   04/15/2018:  Chest, abdomen, and pelvis CT revealed innumerable (> 100) cavitary nodules scattered in the lungs, moderately enlarging compared to the 11/08/2017 PET-CT, suspicious for metastatic disease.  One index node in the RLL measures 1.0 x 1.1 cm (previously 0.6 x 0.6 cm).  There were no new nodules.  There was an ill-defined wall thickening in the rectosigmoid with surrounding stranding along fascia planes, probably sequela from prior radiation therapy.  There was multilevel lumbar impingement due to spondylosis and degenerative disc disease.  There was heterogeneous enhancement in the uterus (some possibly from prior radiation therapy). 04/23/2018:  PET scan revealed numerous scattered solid and cavitary nodules in the lungs stable increased in size compared to the prior PET-CT from 11/08/2017. Largest nodule was 1.1 cm in the LUL (SUV 1.9).  These demonstrated low-grade metabolic activity up to a maximum SUV of about 2.3, increased from 11/08/2017.  05/23/2018:  Abdomen and pelvic CT revealed colonic wall thickening, consistent with colitis. Considerations include inflammatory or infectious causes. Ischemic causes would be less likely. She is s/p RIGHT hemicolectomy. She had a patent ileocolic anastomosis.  She received a 10 day course of ciprofloxacin and Flagyl.   09/07/2018:  Abdomen and pelvic CT angiogram  revealed colitis, mainly affecting lower loops, question radiation colitis if there has been radiotherapy of the patient's cervical cancer.  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  09/30/2018:  Chest CT revealed interval decrease in size and number  of metastatic pulmonary nodules.  There were no new/progressive findings in the chest or upper abdomen   Infusion on 11/06/2018  Component Date Value Ref Range Status  . Magnesium 11/06/2018 1.3* 1.7 - 2.4 mg/dL Final   Performed at [facility], [address], [place], [place] [address]  . WBC 11/06/2018 3.1* 4.0 - 10.5 K/uL Final  . RBC 11/06/2018 2.94* 3.87 - 5.11 MIL/uL Final  . Hemoglobin 11/06/2018 10.3* 12.0 - 15.0 g/dL Final  . HCT 11/06/2018 30.4* 36.0 - 46.0 % Final  . MCV 11/06/2018 103.4* 80.0 - 100.0 fL Final  . MCH 11/06/2018 35.0* 26.0 - 34.0 pg Final  . MCHC 11/06/2018 33.9  30.0 - 36.0 g/dL Final  . RDW 11/06/2018 14.8  11.5 - 15.5 % Final  . Platelets 11/06/2018 133* 150 - 400 K/uL Final  . nRBC 11/06/2018 0.0  0.0 - 0.2 % Final  . Neutrophils Relative % 11/06/2018 59  % Final  . Neutro Abs 11/06/2018 1.8  1.7 - 7.7 K/uL Final  . Lymphocytes Relative 11/06/2018 34  % Final  . Lymphs Abs 11/06/2018 1.0  0.7 - 4.0 K/uL Final  . Monocytes Relative 11/06/2018 6  % Final  . Monocytes Absolute 11/06/2018 0.2  0.1 - 1.0 K/uL Final  . Eosinophils Relative 11/06/2018 1  % Final  . Eosinophils Absolute 11/06/2018 0.0  0.0 - 0.5 K/uL Final  . Basophils Relative 11/06/2018 0  % Final  . Basophils Absolute 11/06/2018 0.0  0.0 - 0.1 K/uL Final  . Immature Granulocytes 11/06/2018 0  % Final  . Abs Immature Granulocytes 11/06/2018 0.01  0.00 - 0.07 K/uL Final   Performed at [facility] [address] [place], [place] [address]  . Sodium 11/06/2018 134* 135 - 145 mmol/L Final  . Potassium 11/06/2018 3.2* 3.5 - 5.1 mmol/L Final  . Chloride 11/06/2018 99  98 - 111 mmol/L Final  . CO2 11/06/2018 24  22 - 32  mmol/L Final  . Glucose, Bld 11/06/2018 90  70 - 99 mg/dL Final  . BUN 11/06/2018 13  6 - 20 mg/dL Final  . Creatinine, Ser 11/06/2018 0.84  0.44 - 1.00 mg/dL Final  . Calcium 11/06/2018 8.7* 8.9 - 10.3 mg/dL Final  . GFR calc non Af Amer 11/06/2018 >60  >60 mL/min Final  . GFR calc Af Amer 11/06/2018 >60  >60 mL/min Final  . Anion gap 11/06/2018 11  5 - 15 Final   Performed at [facility], [address], [place], [place] [address]  Office Visit on 11/06/2018  Component Date Value Ref Range Status  . Ferritin 11/06/2018 519* 11 - 307 ng/mL Final   Performed at [facility], [address] [place], [place] [address]    Assessment:  Joanna Hall is a 53 y.o. female with a history of stage IB1 cervical cancer s/p concurrent cisplatin and radiation (external beam and brachytherapy) from 11/2016 - 02/2017.    She was treated by Dr. Christene Slates at [facility] in [place], [place].  In 01/2017, she received cisplatin x 2 and carboplatin x 1 (01/29/2017) due to ARF and XRT. Radiation was followed by T & O on 02/01/2017 and T & N on 02/10/2017 and 02/20/2017.  Course was complicated by weight loss (80 pounds), nausea, vomiting, electrolyte wasting (potassium and magnesium), and multiple hospitalizations.  PET scan in 06/2017 revealed enlarging pulmonary nodules but no evidence of abdominal disease per patient report.  She is s/p right ileocolectomy on 08/31/2017 for a small bowel obstruction.  She presented with abdominal pain.  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 PDL-1 testing reveals a CPS score of 15.  Chest, abdomen, and pelvis CT on 04/15/2018 revealed innumerable (> 100) cavitary nodules scattered in the lungs, moderately enlarging compared to the 11/08/2017 PET-CT, suspicious for metastatic  disease. One index node in the RLL measures 1.0 x 1.1 cm.  There were no new nodules.  There was an ill-defined wall thickening in the rectosigmoid with surrounding stranding along fascia planes, probably sequela from prior radiation therapy.  There was multilevel lumbar impingement due to spondylosis and degenerative disc disease.  There was heterogeneous enhancement in the uterus (some possibly from prior radiation therapy).  PET scan on 04/23/2018 revealed numerous scattered solid and cavitary nodules in the lungs stable increased in size compared to the prior PET-CT from 11/08/2017. Largest nodule was 1.1 cm in the LUL (SUV 1.9).  These demonstrated low-grade metabolic activity up to a maximum SUV of about 2.3, increased from 11/08/2017.   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  She is day 32 s/p cycle # 5 cycle of carboplatin and Taxol (06/04/2018 - 09/30/2018) with Margarette [place] support.    Chest CT on 09/30/2018 revealed interval decrease in size and number of metastatic pulmonary nodules.  There were no new/progressive findings in the chest or upper abdomen  Hypercoagulable work-up on 09/14/2017 revealed heterozygosity for Factor V Leiden (single [id number] mutation).  Testing was negative for prothrombin gene mutation, lupus anticoagulant panel, anticardiolipin antibodies, and beta-2 glycoprotein antibodies.  She was discharged on Xarelto.  She was admitted to [facility] from 12/03/2017 - 12/08/2017 with abdominal pain. She was not felt to have a uterine infarct.  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  The etiology was unclear.  She was admitted to [facility] from 10/08/2018 - 10/11/2018 for rectal bleeding. Hemoglobin dropped from 13 to 8.9 in 4 days. Platelets had decreased to 81,000; chronic anticoagulation (Xarelto) was held. She received 2 units of PRBCs. Repeat stool studies were  negative for diarrhea of infectious etiology. Colonoscopy on 10/10/2018 revealed 3 ulcers in the terminal ileum (no evidence of dysplasia or malignancy). Throughout the course of her admission, platelet count was monitored (dropped as low as 61,000).   She was admitted to [facility] from 10/17/2018 - 10/18/2018 at [facility] abdominal pain, nausea and vomiting.  Abdomen and pelvis CT suggested a partial small bowel obstruction.  She was seen by general surgery and had no evidence of bowel obstruction clinically as she had a bowel movement was passing flatus and had a benign abdomen.  She is hepatitis C positive.   Hepatitis C genotype is 2a/2c.  She has B12 deficiency.  B12 was 165 on 11/27/2017.  She began B12 injections on 11/29/2017.  B12 injections occur monthly at home.  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  She was diagnosed with colitis.  Abdomen and pelvic CT on 05/23/2018 revealed colonic wall thickening, consistent with colitis. Considerations include inflammatory or infectious causes. Ischemic causes would be less likely. She is s/p RIGHT hemicolectomy. She had a patent ileocolic anastomosis.  She received a 10 day course of ciprofloxacin and Flagyl.  Abdomen and pelvic CT angiogram on 09/07/2018 revealed colitis, mainly affecting  lower loops, question radiation colitis if there has been radiotherapy of the patient's cervical cancer.  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   She has  chronic diarrhea s/p right hemicolectomy.  She began sandostatin 20 mg IM q month on 10/22/2018.  She began Levbid on 10/23/2018.   EGD on 07/30/2018 revealed an erythematous mucosa in the cardia, bilious gastric fluid, and LA grade A reflux esophagitis.  She has adrenal insufficiency.  She is on hydrocortisone.  She underwent full dental extraction on 11/05/2018.  Symptomatically, she notes recent black emesis and dark stool.  She denied any hematochezia or hematuria.  Exam reveals chronic mild lower  abdominal pain without guarding or rebound tenderness.  Plan: 1. Labs today: CBC with diff, BMP, Mg. 2. Metastatic cervical cancer Patient is s/p 5 cycles of carboplatin + paclitaxel with Neulasta support. Discuss plan to reinstitute chemotherapy following resolution of GI issues. 3. Anemia and thrombocytopenia Labs reviewed.   Counts drifting down despite no chemotherapy. Hematocrit 30.4 and hemoglobin 10.3. Platelets 133,000. Initial plan to restart Xarelto s/p dental procedure. Continue to hold Xarelto secondary to concern for GI bleeding. 4. Vaginal bleeding  Patient denies any vaginal bleeding.  Gyn-Onc noted vaginal atrophy with bleeding on contact on 10/23/2018. 5. History of rectal bleeding Colonoscopy on 10/10/2018 revealed 3 ulcers noted at the terminal ileum.  Pathology was negative for high grade dysplasia or malignancy.  Concern remains for possible ulcerative colitis or Crohn's disease. Patient has been referred to [facility].  Awaiting clearance to be seen. Check stool guaiac and ferritin. 6. Nausea and diarrhea Chronic. Patient on sandostatin monthly without improvement. Patient has been referred to [facility]. Contact GI today. 7. Electrolyte derangements Labs reviewed.  BUN 13 and creatinine 0.84.  Potassium 3.2.  Magnesium  1.3. Potassium chloride 20 meq and magnesium 4 gm IV today.  Rx:  potassium chloride 20 meq a day x 4 days. 8. Dental issues  s/p full dental extraction yesterday. 9.   RTC in 1 week for MD assessment, labs (CBC with diff, BMP), and +/- IVF, Mg, and K.   Lequita Asal, MD  11/06/2018, 4:35 PM

## 2018-11-06 NOTE — Progress Notes (Signed)
Patient here today for possible Magnesium infusion. Reports having all of her teeth pulled yesterday. Denies any pain or bleeding in gums.

## 2018-11-06 NOTE — Progress Notes (Signed)
Pt here for follow up. Denies any concerns at this time.  

## 2018-11-07 ENCOUNTER — Ambulatory Visit: Admit: 2018-11-07 | Payer: Medicaid Other | Admitting: Gastroenterology

## 2018-11-07 ENCOUNTER — Other Ambulatory Visit: Payer: Self-pay | Admitting: Urgent Care

## 2018-11-07 SURGERY — IMAGING PROCEDURE, GI TRACT, INTRALUMINAL, VIA CAPSULE
Anesthesia: General

## 2018-11-07 MED ORDER — ONDANSETRON HCL 8 MG PO TABS: 8.0000 mg | ORAL_TABLET | Freq: Three times a day (TID) | ORAL | 1 refills | Status: DC | PRN

## 2018-11-08 ENCOUNTER — Encounter: Payer: Self-pay | Admitting: Urgent Care

## 2018-11-08 DIAGNOSIS — Z5111 Encounter for antineoplastic chemotherapy: Secondary | ICD-10-CM | POA: Diagnosis not present

## 2018-11-08 MED ORDER — OXYCODONE HCL 5 MG PO TABS: 5.0000 mg | ORAL_TABLET | Freq: Four times a day (QID) | ORAL | 0 refills | Status: DC | PRN

## 2018-11-08 NOTE — Progress Notes (Signed)
[place]  Narcotic refill request: 11/08/18  Name: Joanna Hall  DOB: 1966-07-08  MRN: [id number]   [place] reviewed prior to consideration of refills:  NARX scores --    Narcotic: 642  Sedative: 310  30 day average MME/day: 30.23  30 day average LME/day: 0.00  ORS score (range 0-999): 600  Providers outside of this practice prescribing narcotics/sedatives in the last 30 days: YES (approved dental procedure)  Last filled:    Given a current oncology diagnosis (cervical cancer), this patient has the potential to experience significant cancer related pain. Benefits versus risks associated with continued therapy considered. Will continue pain management with opioids as previously prescribed.   Patient educated that medications should not be bitten, chewed, or crushed. Additionally, safety precautions reviewed. Patient verbalized understanding that medications should not be sold or shared, taken with alcohol, or used while driving. She has been made aware of the side effects of using this medication. Patient understands that this medication can cause CNS depression, increase her risk of falls, and even lead to overdose that may result in death, if used outside of the parameters that she and I discussed.   With all of this in mind, she accepts the risks and responsibilities associated with therapy and elects to continue to use the prescribed interventions.   Refill prescription sent in for: 1. Roxicodone 5 mg q6h PRN (Disp #30)   Honor Loh, MSN, APRN, FNP-C, CEN Oncology/Hematology Nurse Practitioner  [place] 11/08/18 4:52 PM

## 2018-11-09 ENCOUNTER — Encounter: Payer: Self-pay | Admitting: Hematology and Oncology

## 2018-11-09 ENCOUNTER — Other Ambulatory Visit: Payer: Self-pay | Admitting: Urgent Care

## 2018-11-09 DIAGNOSIS — Z5111 Encounter for antineoplastic chemotherapy: Secondary | ICD-10-CM | POA: Diagnosis not present

## 2018-11-09 MED ORDER — OXYCODONE HCL 5 MG PO TABS: 5.0000 mg | ORAL_TABLET | Freq: Four times a day (QID) | ORAL | 0 refills | Status: DC | PRN

## 2018-11-10 DIAGNOSIS — Z5111 Encounter for antineoplastic chemotherapy: Secondary | ICD-10-CM | POA: Diagnosis not present

## 2018-11-11 ENCOUNTER — Inpatient Hospital Stay: Payer: Medicaid Other

## 2018-11-11 ENCOUNTER — Other Ambulatory Visit: Payer: Self-pay | Admitting: Gastroenterology

## 2018-11-11 ENCOUNTER — Encounter: Payer: Self-pay | Admitting: Gastroenterology

## 2018-11-11 ENCOUNTER — Other Ambulatory Visit: Payer: Self-pay

## 2018-11-11 DIAGNOSIS — C7800 Secondary malignant neoplasm of unspecified lung: Secondary | ICD-10-CM

## 2018-11-11 DIAGNOSIS — D649 Anemia, unspecified: Secondary | ICD-10-CM

## 2018-11-11 DIAGNOSIS — C538 Malignant neoplasm of overlapping sites of cervix uteri: Secondary | ICD-10-CM

## 2018-11-11 LAB — OCCULT BLOOD X 1 CARD TO LAB, STOOL
Fecal Occult Bld: NEGATIVE
Fecal Occult Bld: NEGATIVE
Fecal Occult Bld: NEGATIVE

## 2018-11-11 MED ORDER — HYDROCORTISONE 10 MG PO TABS: 10.0000 mg | ORAL_TABLET | Freq: Two times a day (BID) | ORAL | 0 refills | Status: DC

## 2018-11-11 MED ORDER — AMITRIPTYLINE HCL 75 MG PO TABS: 75.0000 mg | ORAL_TABLET | Freq: Every day | ORAL | 1 refills | Status: DC

## 2018-11-13 ENCOUNTER — Inpatient Hospital Stay: Payer: Medicaid Other

## 2018-11-13 ENCOUNTER — Inpatient Hospital Stay (HOSPITAL_BASED_OUTPATIENT_CLINIC_OR_DEPARTMENT_OTHER): Payer: Medicaid Other | Admitting: Hematology and Oncology

## 2018-11-13 ENCOUNTER — Ambulatory Visit (INDEPENDENT_AMBULATORY_CARE_PROVIDER_SITE_OTHER): Payer: Medicaid Other | Admitting: Gastroenterology

## 2018-11-13 ENCOUNTER — Encounter: Payer: Self-pay | Admitting: Gastroenterology

## 2018-11-13 ENCOUNTER — Other Ambulatory Visit: Payer: Self-pay | Admitting: Hematology and Oncology

## 2018-11-13 ENCOUNTER — Encounter: Payer: Self-pay | Admitting: Urgent Care

## 2018-11-13 ENCOUNTER — Encounter: Payer: Self-pay | Admitting: Hematology and Oncology

## 2018-11-13 VITALS — BP 148/93 | HR 61 | Temp 97.7°F | Resp 16 | Wt 179.8 lb

## 2018-11-13 VITALS — BP 147/86 | HR 70 | Resp 17 | Ht 68.0 in | Wt 178.6 lb

## 2018-11-13 DIAGNOSIS — D696 Thrombocytopenia, unspecified: Secondary | ICD-10-CM

## 2018-11-13 DIAGNOSIS — Z79899 Other long term (current) drug therapy: Secondary | ICD-10-CM

## 2018-11-13 DIAGNOSIS — K625 Hemorrhage of anus and rectum: Secondary | ICD-10-CM

## 2018-11-13 DIAGNOSIS — D649 Anemia, unspecified: Secondary | ICD-10-CM

## 2018-11-13 DIAGNOSIS — R748 Abnormal levels of other serum enzymes: Secondary | ICD-10-CM

## 2018-11-13 DIAGNOSIS — C7800 Secondary malignant neoplasm of unspecified lung: Secondary | ICD-10-CM

## 2018-11-13 DIAGNOSIS — R300 Dysuria: Secondary | ICD-10-CM

## 2018-11-13 DIAGNOSIS — D61818 Other pancytopenia: Secondary | ICD-10-CM

## 2018-11-13 DIAGNOSIS — E538 Deficiency of other specified B group vitamins: Secondary | ICD-10-CM

## 2018-11-13 DIAGNOSIS — E860 Dehydration: Secondary | ICD-10-CM

## 2018-11-13 DIAGNOSIS — Z923 Personal history of irradiation: Secondary | ICD-10-CM

## 2018-11-13 DIAGNOSIS — R197 Diarrhea, unspecified: Secondary | ICD-10-CM

## 2018-11-13 DIAGNOSIS — D6851 Activated protein C resistance: Secondary | ICD-10-CM

## 2018-11-13 DIAGNOSIS — K529 Noninfective gastroenteritis and colitis, unspecified: Secondary | ICD-10-CM

## 2018-11-13 DIAGNOSIS — C538 Malignant neoplasm of overlapping sites of cervix uteri: Secondary | ICD-10-CM

## 2018-11-13 DIAGNOSIS — K648 Other hemorrhoids: Secondary | ICD-10-CM

## 2018-11-13 DIAGNOSIS — A0472 Enterocolitis due to Clostridium difficile, not specified as recurrent: Secondary | ICD-10-CM

## 2018-11-13 DIAGNOSIS — B1920 Unspecified viral hepatitis C without hepatic coma: Secondary | ICD-10-CM

## 2018-11-13 DIAGNOSIS — E871 Hypo-osmolality and hyponatremia: Secondary | ICD-10-CM

## 2018-11-13 DIAGNOSIS — Z5111 Encounter for antineoplastic chemotherapy: Secondary | ICD-10-CM | POA: Diagnosis not present

## 2018-11-13 LAB — CBC WITH DIFFERENTIAL/PLATELET
Abs Immature Granulocytes: 0.01 10*3/uL (ref 0.00–0.07)
Basophils Absolute: 0 10*3/uL (ref 0.0–0.1)
Basophils Relative: 0 %
Eosinophils Absolute: 0 10*3/uL (ref 0.0–0.5)
Eosinophils Relative: 1 %
HCT: 29.4 % — ABNORMAL LOW (ref 36.0–46.0)
Hemoglobin: 9.8 g/dL — ABNORMAL LOW (ref 12.0–15.0)
Immature Granulocytes: 0 %
Lymphocytes Relative: 26 %
Lymphs Abs: 0.7 10*3/uL (ref 0.7–4.0)
MCH: 35.9 pg — ABNORMAL HIGH (ref 26.0–34.0)
MCHC: 33.3 g/dL (ref 30.0–36.0)
MCV: 107.7 fL — ABNORMAL HIGH (ref 80.0–100.0)
Monocytes Absolute: 0.2 10*3/uL (ref 0.1–1.0)
Monocytes Relative: 7 %
Neutro Abs: 1.9 10*3/uL (ref 1.7–7.7)
Neutrophils Relative %: 66 %
Platelets: 120 10*3/uL — ABNORMAL LOW (ref 150–400)
RBC: 2.73 MIL/uL — ABNORMAL LOW (ref 3.87–5.11)
RDW: 14.9 % (ref 11.5–15.5)
WBC: 2.9 10*3/uL — ABNORMAL LOW (ref 4.0–10.5)
nRBC: 0 % (ref 0.0–0.2)

## 2018-11-13 LAB — TSH: TSH: 2.367 u[IU]/mL (ref 0.350–4.500)

## 2018-11-13 LAB — BASIC METABOLIC PANEL
Anion gap: 5 (ref 5–15)
BUN: 18 mg/dL (ref 6–20)
CO2: 25 mmol/L (ref 22–32)
Calcium: 8 mg/dL — ABNORMAL LOW (ref 8.9–10.3)
Chloride: 104 mmol/L (ref 98–111)
Creatinine, Ser: 0.94 mg/dL (ref 0.44–1.00)
GFR calc Af Amer: >60 mL/min (ref >60)
GFR calc non Af Amer: >60 mL/min (ref >60)
Glucose, Bld: 91 mg/dL (ref 70–99)
Potassium: 4.2 mmol/L (ref 3.5–5.1)
Sodium: 134 mmol/L — ABNORMAL LOW (ref 135–145)

## 2018-11-13 LAB — MAGNESIUM: Magnesium: 1.4 mg/dL — ABNORMAL LOW (ref 1.7–2.4)

## 2018-11-13 MED ORDER — SODIUM CHLORIDE 0.9% FLUSH
10.0000 mL | INTRAVENOUS | Status: DC | PRN
  Administered 2018-11-13: 10 mL via INTRAVENOUS
  Filled 2018-11-13: qty 10

## 2018-11-13 MED ORDER — HEPARIN SOD (PORK) LOCK FLUSH 100 UNIT/ML IV SOLN
500.0000 [IU] | Freq: Once | INTRAVENOUS | Status: AC
  Administered 2018-11-13: 500 [IU] via INTRAVENOUS
  Filled 2018-11-13: qty 5

## 2018-11-13 MED ORDER — MAGNESIUM SULFATE 4 GM/100ML IV SOLN
4.0000 g | Freq: Once | INTRAVENOUS | Status: AC
  Administered 2018-11-13: 4 g via INTRAVENOUS
  Filled 2018-11-13: qty 100

## 2018-11-13 MED ORDER — SODIUM CHLORIDE 0.9 % IV SOLN
Freq: Once | INTRAVENOUS | Status: AC
  Administered 2018-11-13: 10:00:00 via INTRAVENOUS
  Filled 2018-11-13: qty 250

## 2018-11-13 NOTE — Progress Notes (Signed)
[facility] day:  11/13/2018   Chief Complaint: Joanna Hall is a 53 y.o. female with stage IV cervical cancer who is seen for assessment on day 38 following cycle #5 carboplatin + paclitaxel.   HPI:  The patient was last seen in the medical oncology clinic on 11/06/2018.  At that time, Joanna Hall noted no change in her diarrhea on sandostatin.  Joanna Hall described an episode of dark/black emesis and dark stool.  Joanna Hall denied any rectal or vaginal bleeding.  Teeth had been extracted the day prior.  Exam was stable.  Potassium was 3.2.  Magnesium was 1.3.  Joanna Hall received potassium 20 mEq IV + Magnesium 4 gm IV.  Guaiac cards were collected.  GI was contacted.  Hematocrit had decreased from 34.1 to 30.4.  Ferritin was 519.  Guaiac cards were negative.  Joanna Hall was awaiting consult with [facility].  Joanna Hall is scheduled to see Dr. Marius Ditch (GI) later today to discuss.   Patient is status post recent multiple dental extractions. Joanna Hall was seen in follow up by her dentist yesterday and was advised that Joanna Hall was "healing well". Dentist will be holding off for at least 2 weeks before considering dentures for her in order to allow her to heal.   Symptomatically, Joanna Hall is doing "about the same". Diarrhea persists despite currently prescribed interventions. Joanna Hall describes her stools as "foamy" and more malodorous than normal. Patient states, "it smells like it did when I had C.diff". Joanna Hall denies any obvious GI bleeding; no melena or hematochezia. Joanna Hall remains off her her chronic anticoagulation therapy at this time. Patient denies that Joanna Hall has experienced any B symptoms. Joanna Hall denies any interval infections.   Patient complains of pain in her RIGHT knee. Patient has been seen in consult by Dr. [name] (orthopedics) and had a recent arthrocentesis. Orthopedic surgeon questioning possibility of having a potential knee replacement while patient is in a "holding pattern off chemotherapy".   Patient  advises that Joanna Hall maintains an adequate appetite. Joanna Hall is eating well. Weight today is 179 lb 12.6 oz (81.5 kg), which compared to her last visit to the clinic, represents a 7 pound increase.   Patient complains of pain rated 5/10 in the clinic today.   Past Medical History:  Diagnosis Date  . Abnormal cervical Papanicolaou smear 09/18/2017  . Anxiety   . Aortic atherosclerosis ([place])   . Arthritis    neck and knees  . 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 Blood transfusion without reported diagnosis   . Cervical cancer ([place]) 09/2016   mets lung  . Chronic anal fissure   . Chronic diarrhea   . Dyspnea   . Erosive gastropathy 09/18/2017  . Factor V Leiden mutation ([place])   . Fecal incontinence   . Genital warts   . GERD (gastroesophageal reflux disease)   . Heart murmur   . Hemorrhoids   . 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 Hepatitis, chronic ([place]) 05/05/2017  . History of cancer chemotherapy    completed 06/2017  . History of Clostridium difficile infection    while undergoing chemo.  Negative test 10/2017  . Ileocolic anastomotic leak   . 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 Intestinal infection due to Clostridium difficile 09/18/2017  . Macrocytic anemia with vitamin B12 deficiency   . Multiple gastric ulcers   .  Nausea vomiting and diarrhea   . Pancolitis ([place]) 07/27/2018  . Perianal condylomata   . Pneumonia    History of  . Pulmonary nodules   . Rectal bleeding   . Small bowel obstruction ([place]) 08/2017  . Stiff neck    limited right turn  . Vitamin D deficiency     Past Surgical History:  Procedure Laterality Date  . CHOLECYSTECTOMY    . COLON SURGERY  08/2017   resection  . COLONOSCOPY WITH PROPOFOL N/A 12/20/2017   Procedure: COLONOSCOPY WITH PROPOFOL;  Surgeon: [name], MD;  Location: [facility] ENDOSCOPY;  Service: Gastroenterology;  Laterality: N/A;  . COLONOSCOPY WITH PROPOFOL N/A  07/30/2018   Procedure: COLONOSCOPY WITH PROPOFOL;  Surgeon: [name], MD;  Location: [facility] ENDOSCOPY;  Service: Gastroenterology;  Laterality: N/A;  . COLONOSCOPY WITH PROPOFOL N/A 10/10/2018   Procedure: COLONOSCOPY WITH PROPOFOL;  Surgeon: Lucilla Lame, MD;  Location: [facility] ENDOSCOPY;  Service: Endoscopy;  Laterality: N/A;  . DIAGNOSTIC LAPAROSCOPY    . ESOPHAGOGASTRODUODENOSCOPY (EGD) WITH PROPOFOL N/A 12/20/2017   Procedure: ESOPHAGOGASTRODUODENOSCOPY (EGD) WITH PROPOFOL;  Surgeon: [name], MD;  Location: [place];  Service: Gastroenterology;  Laterality: N/A;  . ESOPHAGOGASTRODUODENOSCOPY (EGD) WITH PROPOFOL  07/30/2018   Procedure: ESOPHAGOGASTRODUODENOSCOPY (EGD) WITH PROPOFOL;  Surgeon: [name], MD;  Location: [facility] ENDOSCOPY;  Service: Gastroenterology;;  . LAPAROTOMY N/A 08/31/2017   Procedure: EXPLORATORY LAPAROTOMY for SBO, ileocolectomy, removal of piece of uterine wall;  Surgeon: [name], MD;  Location: [facility] ORS;  Service: General;  Laterality: N/A;  . LASER ABLATION CONDOLAMATA N/A 02/22/2018   Procedure: LASER ABLATION/REMOVAL OF [id number] AROUND ANUS AND VAGINA;  Surgeon: Michael Boston, MD;  Location: [place];  Service: General;  Laterality: N/A;  . OOPHORECTOMY    . PORTA CATH INSERTION N/A 05/13/2018   Procedure: PORTA CATH INSERTION;  Surgeon: Katha Cabal, MD;  Location: [place] CV LAB;  Service: Cardiovascular;  Laterality: N/A;  . SMALL INTESTINE SURGERY    . TANDEM RING INSERTION     x3  . THORACOTOMY    . TOTAL KNEE ARTHROPLASTY Left 04/24/2018   Procedure: TOTAL KNEE ARTHROPLASTY;  Surgeon: Lovell Sheehan, MD;  Location: [facility] ORS;  Service: Orthopedics;  Laterality: Left;    Family History  Problem Relation Age of Onset  . Hypertension Father   . Diabetes Father   . Alcohol abuse Daughter   . Hypertension Maternal Grandmother   . Diabetes Maternal Grandmother   . Diabetes Paternal  Grandmother   . Hypertension Paternal Grandmother     Social History:  reports that Joanna Hall quit smoking about 12 years ago. Joanna Hall has never used smokeless tobacco. Joanna Hall reports previous alcohol use. Joanna Hall reports current drug use. Drug: Marijuana.  The patient's 25 year old daughter died of alcoholism.  The patient is originally from [place].  Joanna Hall moved from [place] to [place]. Joanna Hall is alone today.   Allergies:  Allergies  Allergen Reactions  . Ketamine Anxiety and Other (See Comments)    Syncope episode/confusion     Current Medications: Current Outpatient Medications  Medication Sig Dispense Refill  . amitriptyline (ELAVIL) 75 MG tablet Take 1 tablet (75 mg total) by mouth at bedtime. 90 tablet 1  . budesonide (ENTOCORT EC) 3 MG 24 hr capsule Take 3 capsules (9 mg total) by mouth daily. 30 capsule 0  . Calcium Carb-Cholecalciferol (CALCIUM 500 +D) 500-400 MG-UNIT TABS Take 2 tablets by mouth daily.    Marland Kitchen  diphenoxylate-atropine (LOMOTIL) 2.5-0.025 MG tablet Take 2 tablets by mouth 4 (four) times daily. 30 tablet 0  . hydrocortisone (CORTEF) 10 MG tablet Take 1 tablet (10 mg total) by mouth 2 (two) times daily. 180 tablet 0  . hyoscyamine (LEVBID) 0.375 MG 12 hr tablet Take 1 tablet (0.375 mg total) by mouth 2 (two) times daily. 60 tablet 0  . Multiple Vitamins-Minerals (MULTIVITAMIN WITH MINERALS) tablet Take 1 tablet by mouth daily. 30 tablet 0  . octreotide (SANDOSTATIN LAR DEPOT) 20 MG injection Inject 20 mg into the muscle every 28 (twenty-eight) days. 1 each 0  . ondansetron (ZOFRAN) 8 MG tablet Take 1 tablet (8 mg total) by mouth every 8 (eight) hours as needed for nausea or vomiting. 30 tablet 1  . oxyCODONE (OXY IR/ROXICODONE) 5 MG immediate release tablet Take 1 tablet (5 mg total) by mouth every 6 (six) hours as needed for moderate pain or severe pain. 30 tablet 0  . pantoprazole (PROTONIX) 40 MG tablet Take 1 tablet (40 mg total) by mouth daily. 90 tablet 0  .  potassium chloride SA (K-DUR,KLOR-CON) 20 MEQ tablet Take 1 tablet (20 mEq total) by mouth daily. for 4 days. 30 tablet 0  . promethazine (PHENERGAN) 25 MG tablet Take 1 tablet (25 mg total) by mouth every 6 (six) hours as needed for nausea or vomiting. 30 tablet 0  . VENTOLIN HFA 108 (90 Base) MCG/ACT inhaler Inhale 1-2 puffs into the lungs every 4 (four) hours as needed for shortness of breath.   0  . VIBERZI 100 MG TABS TAKE 1 TABLET BY MOUTH TWICE DAILY WITH  A  MEAL 60 tablet 0   No current facility-administered medications for this visit.    Facility-Administered Medications Ordered in Other Visits  Medication Dose Route Frequency Provider Last Rate Last Dose  . heparin lock flush 100 unit/mL  500 Units Intravenous Once ,  C, MD      . heparin lock flush 100 unit/mL  500 Units Intravenous Once ,  C, MD      . sodium chloride 0.9 % 1,000 mL with potassium chloride 20 mEq infusion   Intravenous Once Honor Loh E, NP      . sodium chloride flush (NS) 0.9 % injection 10 mL  10 mL Intravenous PRN Lequita Asal, MD         Review of Systems  Constitutional: Negative for chills, diaphoresis, malaise/fatigue and weight loss (up 7 pounds).       Feels "about the same".  HENT: Negative.  Negative for congestion, ear discharge, ear pain, nosebleeds, sinus pain, sore throat and tinnitus.        Dental extractions healing well.  Eyes: Negative.  Negative for blurred vision, double vision, photophobia and pain.  Respiratory: Positive for shortness of breath (exertional). Negative for cough, hemoptysis, sputum production and wheezing.   Cardiovascular: Negative.  Negative for chest pain, palpitations, orthopnea, leg swelling and PND.  Gastrointestinal: Positive for abdominal pain (chronic lower abdominal) and diarrhea (foamy, malodorous). Negative for blood in stool, constipation, melena, nausea and vomiting.  Genitourinary: Negative.  Negative for dysuria,  frequency, hematuria and urgency.       No vaginal bleeding.  Musculoskeletal: Negative.  Negative for back pain, falls, joint pain (right knee pain s/p arthrocentesis yesterday), myalgias and neck pain.       Interested in having knee surgery.  Skin: Negative.  Negative for itching and rash.  Neurological: Negative.  Negative for dizziness, tingling, tremors, sensory  change, speech change, focal weakness, weakness and headaches.  Endo/Heme/Allergies: Negative.  Does not bruise/bleed easily.  Psychiatric/Behavioral: Negative.  Negative for depression and memory loss. The patient is not nervous/anxious and does not have insomnia.   All other systems reviewed and are negative.  Performance status (ECOG): 2  Vital Signs BP (!) 148/93 (BP Location: Right Arm, Patient Position: Sitting)   Pulse 61   Temp 97.7 F (36.5 C) (Oral)   Resp 16   Wt 179 lb 12.6 oz (81.5 kg)   SpO2 99%   BMI 27.34 kg/m   Physical Exam  Constitutional: Joanna Hall is oriented to person, place, and time. Vital signs are normal.  Non-toxic appearance. No distress.  Slightly fatigued appearing woman sitting in the exam room in no acute distress.  HENT:  Head: Normocephalic and atraumatic. Hair is normal (chemotherapy induced alopecia).  Mouth/Throat: Oropharynx is clear and moist and mucous membranes are normal. No oropharyngeal exudate.  Wearing a light blue wrap.  Eyes: Pupils are equal, round, and reactive to light. Conjunctivae and EOM are normal. No scleral icterus.  Neck: Normal range of motion. Neck supple. No JVD present.  Cardiovascular: Normal rate, regular rhythm, normal heart sounds and intact distal pulses. Exam reveals no gallop and no friction rub.  No murmur heard. Pulmonary/Chest: Effort normal and breath sounds normal. No respiratory distress. Joanna Hall has no wheezes. Joanna Hall has no rales.  Abdominal: Soft. Bowel sounds are normal. Joanna Hall exhibits no distension and no mass. There is no hepatosplenomegaly. There is  abdominal tenderness (chronic lower abdominal discomfort on palpation). There is no rebound and no guarding.  Musculoskeletal: Normal range of motion.        General: No tenderness or edema.  Lymphadenopathy:    Joanna Hall has no cervical adenopathy.    Joanna Hall has no axillary adenopathy.       Right: No supraclavicular adenopathy present.       Left: No supraclavicular adenopathy present.  Neurological: Joanna Hall is alert and oriented to person, place, and time. Gait normal.  Skin: Skin is warm and dry. No rash noted. Joanna Hall is not diaphoretic. No erythema. No pallor.  Psychiatric: Mood, affect and judgment normal.  Nursing note and vitals reviewed.   Imaging studies: 06/2017:  PET scan revealed enlarging pulmonary nodules but no evidence of abdominal disease per patient report. 09/13/2017:  Abdomen and pelvic CT revealed a small infarct in the inferior pole of the LEFT kidney and RIGHT uterine infarct. 11/08/2017:  PET scan at the [facility] revealed innumerable stable bilateral cavitary pulmonary nodules (up to 8 mm), some of which demonstrate low level metabolic activity.  There were post treatment changes in the pelvis without evidence of suspicious hypermetabolic activity. 12/03/2017:  Abdomen and pelvic CT revealed continued multiple pulmonary nodules in the lung bases concerning for metastatic disease.  There was mild wall thickening of rectosigmoid colon is noted.  There was a stable 1.8 x 1.6 cm mixed fat and soft tissue density noted in right lower quadrant which may represent fat necrosis.  There was a larger low density is noted involving uterine fundus.  12/03/2017:  Pelvic MRI was unremarkable.   04/15/2018:  Chest, abdomen, and pelvis CT revealed innumerable (> 100) cavitary nodules scattered in the lungs, moderately enlarging compared to the 11/08/2017 PET-CT, suspicious for metastatic disease.  One index node in the RLL measures 1.0 x 1.1 cm (previously 0.6 x 0.6 cm).   There were no new nodules.  There was an ill-defined  wall thickening in the rectosigmoid with surrounding stranding along fascia planes, probably sequela from prior radiation therapy.  There was multilevel lumbar impingement due to spondylosis and degenerative disc disease.  There was heterogeneous enhancement in the uterus (some possibly from prior radiation therapy). 04/23/2018:  PET scan revealed numerous scattered solid and cavitary nodules in the lungs stable increased in size compared to the prior PET-CT from 11/08/2017. Largest nodule was 1.1 cm in the LUL (SUV 1.9).  These demonstrated low-grade metabolic activity up to a maximum SUV of about 2.3, increased from 11/08/2017.  05/23/2018:  Abdomen and pelvic CT revealed colonic wall thickening, consistent with colitis. Considerations include inflammatory or infectious causes. Ischemic causes would be less likely. Joanna Hall is s/p RIGHT hemicolectomy. Joanna Hall had a patent ileocolic anastomosis.  Joanna Hall received a 10 day course of ciprofloxacin and Flagyl.   09/07/2018:  Abdomen and pelvic CT angiogram revealed colitis, mainly affecting lower loops, question radiation colitis if there has been radiotherapy of the patient's cervical cancer.  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  09/30/2018:  Chest CT revealed interval decrease in size and number of metastatic pulmonary nodules.  There were no new/progressive findings in the chest or upper abdomen   Infusion on 11/13/2018  Component Date Value Ref Range Status  . Magnesium 11/13/2018 1.4* 1.7 - 2.4 mg/dL Final   Performed at [facility], [address] [place], [place] [address]  . Sodium 11/13/2018 134* 135 - 145 mmol/L Final  . Potassium 11/13/2018 4.2  3.5 - 5.1 mmol/L Final  . Chloride 11/13/2018 104  98 - 111 mmol/L Final  . CO2 11/13/2018 25  22 - 32 mmol/L Final  . Glucose, Bld 11/13/2018 91  70 - 99 mg/dL Final  . BUN 11/13/2018 18  6 - 20 mg/dL Final  . Creatinine, Ser 11/13/2018 0.94   0.44 - 1.00 mg/dL Final  . Calcium 11/13/2018 8.0* 8.9 - 10.3 mg/dL Final  . GFR calc non Af Amer 11/13/2018 >60  >60 mL/min Final  . GFR calc Af Amer 11/13/2018 >60  >60 mL/min Final  . Anion gap 11/13/2018 5  5 - 15 Final   Performed at [facility], [address], [place], [place] [address]  . WBC 11/13/2018 2.9* 4.0 - 10.5 K/uL Final  . RBC 11/13/2018 2.73* 3.87 - 5.11 MIL/uL Final  . Hemoglobin 11/13/2018 9.8* 12.0 - 15.0 g/dL Final  . HCT 11/13/2018 29.4* 36.0 - 46.0 % Final  . MCV 11/13/2018 107.7* 80.0 - 100.0 fL Final  . MCH 11/13/2018 35.9* 26.0 - 34.0 pg Final  . MCHC 11/13/2018 33.3  30.0 - 36.0 g/dL Final  . RDW 11/13/2018 14.9  11.5 - 15.5 % Final  . Platelets 11/13/2018 120* 150 - 400 K/uL Final  . nRBC 11/13/2018 0.0  0.0 - 0.2 % Final  . Neutrophils Relative % 11/13/2018 66  % Final  . Neutro Abs 11/13/2018 1.9  1.7 - 7.7 K/uL Final  . Lymphocytes Relative 11/13/2018 26  % Final  . Lymphs Abs 11/13/2018 0.7  0.7 - 4.0 K/uL Final  . Monocytes Relative 11/13/2018 7  % Final  . Monocytes Absolute 11/13/2018 0.2  0.1 - 1.0 K/uL Final  . Eosinophils Relative 11/13/2018 1  % Final  . Eosinophils Absolute 11/13/2018 0.0  0.0 - 0.5 K/uL Final  . Basophils Relative 11/13/2018 0  % Final  . Basophils Absolute 11/13/2018 0.0  0.0 - 0.1 K/uL Final  . Immature Granulocytes 11/13/2018 0  %  Final  . Abs Immature Granulocytes 11/13/2018 0.01  0.00 - 0.07 K/uL Final   Performed at [facility], [address] [place], [place] [address]  Appointment on 11/11/2018  Component Date Value Ref Range Status  . Fecal Occult Bld 11/08/2018 NEGATIVE  NEGATIVE Final   Performed at [facility], [address] [place], [place] [address]  . Fecal Occult Bld 11/09/2018 NEGATIVE  NEGATIVE Final   Performed at [facility], [address] [place], [place] [address]  . Fecal Occult Bld 11/10/2018 NEGATIVE  NEGATIVE Final   Performed at [facility], [address] [place], [place] [address]    Assessment:  EMIL KLASSEN is a 53 y.o. female with a history of stage IB1 cervical cancer s/p concurrent cisplatin and radiation (external beam and brachytherapy) from 11/2016 - 02/2017.    Joanna Hall was treated by Dr. Christene Slates at [facility] in [place], [place].  In 01/2017, Joanna Hall received cisplatin x 2 and carboplatin x 1 (01/29/2017) due to ARF and XRT. Radiation was followed by T & O on 02/01/2017 and T & N on 02/10/2017 and 02/20/2017.  Course was complicated by weight loss (80 pounds), nausea, vomiting, electrolyte wasting (potassium and magnesium), and multiple hospitalizations.  PET scan in 06/2017 revealed enlarging pulmonary nodules but no evidence of abdominal disease per patient report.  Joanna Hall is s/p right ileocolectomy on 08/31/2017 for a small bowel obstruction.  Joanna Hall presented with abdominal pain.  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 PDL-1 testing reveals a CPS score of 15.  Chest, abdomen, and pelvis CT on 04/15/2018 revealed innumerable (> 100) cavitary nodules scattered in the lungs, moderately enlarging compared to the 11/08/2017 PET-CT, suspicious for metastatic disease. One index node in the RLL measures 1.0 x 1.1 cm.  There were no new nodules.  There was an ill-defined wall thickening in the rectosigmoid with surrounding stranding along fascia planes, probably sequela from prior radiation therapy.  There was multilevel lumbar impingement due to spondylosis and degenerative disc disease.  There was heterogeneous enhancement in the uterus (some possibly from prior radiation therapy).  PET scan on 04/23/2018 revealed numerous scattered solid and cavitary nodules in the lungs stable increased in size compared to the prior PET-CT from 11/08/2017.  Largest nodule was 1.1 cm in the LUL (SUV 1.9).  These demonstrated low-grade metabolic activity up to a maximum SUV of about 2.3, increased from 11/08/2017.   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  Joanna Hall is day 29 s/p cycle # 5 cycle of carboplatin and Taxol (06/04/2018 - 09/30/2018) with Margarette Canada support.    Chest CT on 09/30/2018 revealed interval decrease in size and number of metastatic pulmonary nodules.  There were no new/progressive findings in the chest or upper abdomen  Hypercoagulable work-up on 09/14/2017 revealed heterozygosity for Factor V Leiden (single [id number] mutation).  Testing was negative for prothrombin gene mutation, lupus anticoagulant panel, anticardiolipin antibodies, and beta-2 glycoprotein antibodies.  Joanna Hall was discharged on Xarelto.  Joanna Hall was admitted to [facility] from 12/03/2017 - 12/08/2017 with abdominal pain. Joanna Hall was not felt to have a uterine infarct.  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  The etiology was unclear.  Joanna Hall was admitted to [facility] from 10/08/2018 -  10/11/2018 for rectal bleeding. Hemoglobin dropped from 13 to 8.9 in 4 days. Platelets had decreased to 81,000; chronic anticoagulation (Xarelto) was held. Joanna Hall received 2 units of PRBCs. Repeat stool studies were negative for diarrhea of infectious etiology. Colonoscopy on 10/10/2018 revealed 3 ulcers in the terminal ileum (no evidence of dysplasia or malignancy). Throughout the course of her admission, platelet count was monitored (dropped as low as 61,000).   Joanna Hall was admitted to [facility] from 10/17/2018 - 10/18/2018 at [facility] abdominal pain, nausea and vomiting.  Abdomen and pelvis CT suggested a partial small bowel obstruction.  Joanna Hall was seen by general surgery and had no evidence of bowel obstruction clinically as Joanna Hall had a bowel movement was passing flatus and had a benign abdomen.  Joanna Hall is hepatitis  C positive.   Hepatitis C genotype is 2a/2c.  Joanna Hall has B12 deficiency.  B12 was 165 on 11/27/2017.  Joanna Hall began B12 injections on 11/29/2017.  B12 injections occur monthly at home.  Joanna Hall underwent left total knee replacement on 04/24/2018.  Joanna Hall was diagnosed with colitis.  Abdomen and pelvic CT on 05/23/2018 revealed colonic wall thickening, consistent with colitis. Considerations include inflammatory or infectious causes. Ischemic causes would be less likely. Joanna Hall is s/p RIGHT hemicolectomy. Joanna Hall had a patent ileocolic anastomosis.  Joanna Hall received a 10 day course of ciprofloxacin and Flagyl.  Abdomen and pelvic CT angiogram on 09/07/2018 revealed colitis, mainly affecting lower loops, question radiation colitis if there has been radiotherapy of the patient's cervical cancer.  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   Joanna Hall has  chronic diarrhea s/p right hemicolectomy.  Joanna Hall began sandostatin 20 mg IM q month on 10/22/2018.  Joanna Hall began Levbid on 10/23/2018.   EGD on 07/30/2018 revealed an erythematous mucosa in the cardia, bilious gastric fluid, and LA grade A reflux esophagitis.  Joanna Hall has adrenal insufficiency and is on hydrocortisone.  Joanna Hall underwent full dental extraction on 11/05/2018.  Symptomatically, Joanna Hall notes foamy and malodorous stools.  Joanna Hall continues to have diarrhea.  Joanna Hall has right knee pain and want to pursue knee surgery.  Counts are drifting down.  Abdominal exam is stable.  Plan: 1. Labs today: CBC with diff, BMP, Mg. 2. Metastatic cervical cancer Day 38 s/p last cycle of carboplatin + paclitaxel with Neulasta support. Plan to continue to hold  chemotherapy secondary to ongoing GI issues. 3. Pancytopenia Hematocrit 29.4 and hemoglobin 9.8. Platelets 120,000.  WBC 2900 ([place] 1900). Discuss reinitiation of rivaroxaban.  Discuss concern for counts drifting down despite no chemotherapy. Etiology unclear but possibly related to nutrition with ongoing diarrhea and prior pelvic radiation. Increased  MCV with normal B12, folate, TSH.  No know liver disease. Increased MCV since 10/17/2018.  Doubt MDS.  Discuss consideration of bone marrow aspirate and biopsy. 4. Vaginal bleeding  No current bleeding.  Gyn-Onc on 10/23/2018 noted vaginal atrophy with bleeding on contact. 5. History of rectal bleeding Colonoscopy on 10/10/2018 revealed 3 ulcers noted at the terminal ileum.  Pathology was negative for high grade dysplasia or malignancy.  Concern remains for possible ulcerative colitis or Crohn's disease. Patient has been referred to [facility].  No appointment yet. 6. Chronic diarrhea Diarrhea persists despite multiple interventions.. Sandostatin monthly has not helped. Patient has been referred to [facility]. Patient has appointment with GI today. Concern today for change in stool (foamy and malodorous). Check stool for C diff. 7. Electrolyte derangements Labs reviewed. BUN 18 and creatinine 0.94.  Potassium 4.2.  Magnesium  1.4.  Calcium 8.0. Magnesium 4 gm  IV today.  Patient only taking calcium 500 mg a day.   Increase calcium to 1200 mg a day with vitamin D 800 IU. 8.   RTC in 1 weeks for labs (CBC with, BMP, Mg) and IVFs +/- Mg 9.   RTC in 10 days for MD assessment, labs (CBC with diff, CMP, Mg), and +/- IV Mg.   Honor Loh, NP  11/13/2018, 9:40 AM   I saw and evaluated the patient, participating in the key portions of the service and reviewing pertinent diagnostic studies and records.  I reviewed the nurse practitioner's note and agree with the findings and the plan.  The assessment and plan were discussed with the patient.  Multiple questions were asked by the patient and answered.   Nolon Stalls, MD 11/13/2018,9:40 AM

## 2018-11-13 NOTE — Progress Notes (Signed)
Pt here for follow up. Denies any concerns at this time.  

## 2018-11-13 NOTE — Progress Notes (Signed)
[name], MD [address]  [place]  [place], [place] [address]  Main: [phone]  Fax: [phone]    Gastroenterology Consultation  Referring Provider:     Arnetha Courser, MD Primary Care Physician:  Arnetha Courser, MD Primary Gastroenterologist:  Dr. [name] Reason for Consultation:     Chronic diarrhea        HPI:   Joanna Hall is a 53 y.o. female referred by Dr. Hampton Abbot for consultation & management of and chronic unexplained diarrhea. Patient has history of cervical cancer underwent chemotherapy XRT and brachytherapy, appears to be stable, closely followed by oncology at [facility]. She had small bowel obstruction in 08/2017, underwent exploratory laparoscopy with ileocolectomy and primary anastomosis. 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 Currently on Xarelto  Patient reports that since her bowel resection, she has been experiencing several episodes of nonbloody diarrhea, up to 12 times per day and night associated with urgency, nocturnal diarrhea as well as fecal incontinence. She does report diffuse lower abdominal cramps as well as intermittent gas/bloating.patient reports that she may have seen worms in her stools on several occasions. She does drink well water. She denies recent travel outside the [place] or recent use of antibiotics. She did have C. Difficile infection in the past when she was undergoing chemotherapy for cervical cancer. She had stool studies for C. Difficile and other GI pathogens including ova and parasites in 10/2017 by Dr. Hampton Abbot and they were negative.She has tried Imodium and Lomotil with no relief. Therefore she was referred to GI by Dr. Hampton Abbot for further management. Patient denies any particular relation to food. Prior to her surgery, patient reports having alternating episodes of  diarrhea and constipation associated with abdominal cramps and bloating. She would have up to 2-3 soft bowel movements daily but not this severe.her weight has been stable. She denies nausea, vomiting, fever, night sweats. She denies rectal bleeding. She is found to have macrocytic anemia which has been stable. She is not on any B12 or iron supplements.   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 She does not have any evidence of chronic liver disease.  Follow-up visit 12/12/2017 Patient was admitted to the hospital early this month secondary to lower abdominal pain, CT revealed mild rectosigmoid thickening. She reports that her diarrhea resolved after initiation of cholestyramine. She is currently taking 4 g twice daily. Having one bowel movement daily. She denies rectal bleeding. Her anal fissure had also significantly improved since control of her diarrhea. She has not received topical nitroglycerin ointment. However, she continues to have lower abdominal pain, constant with no relation to food or bowel movements. She tried dicyclomine which did not help. She is seeing Dr. Mike Gip for follow-up of lung nodules and is scheduled to have a CT scan in 02/2018. Her disease has not progressed to date. She also has B12 deficiency for which she is taking B12 injections. She stopped taking NSAIDs  Follow-up visit 02/11/2018 Patient was at her weight has been stable. She feels like she is dependent on Bentyl  and cholestyramine for her diarrhea and abdominal pain. Her diarrhea recurs if she does not take cholestyramine. She takes 2 packets daily which in fact leads to constipation. Bentyl helps with her chronic lower  abdominal cramps. Medication for hepatitis C treatment has been approved, and waiting for the medicine to be shipped. She underwent colonoscopy which showed anal condylomata.  Follow up visit 05/31/2018 Patient underwent left knee replacement at [facility] on 04/24/2018. She also had laser ablation of anal condylomata on 02/22/2018 by Dr. Johney Maine. Both procedures went well. 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 Patient was started on chemotherapy on 05/15/2018. She developed abdominal pain with nausea, vomiting and diarrhea for last 2 weeks. She underwent CT abdomen and pelvis which revealed thickening of the colon, underwent stool studies including C. Difficile and other GI pathogen panel which came back negative. Patient is started on ciprofloxacin 3 days ago. I got a call from Ander Purpura, NP from [place] yesterday about her ongoing diarrhea. I recommended to increase cholestyramine to 3 times daily and add Lomotil. Patient is here as a urgent follow-up of her diarrhea  She took third dose of cholestyramine and took 1 dose of Lomotil last night and she had 1 soft bowel movement this morning. She thinks her diarrhea has slowed down. He otherwise denies any other GI symptoms  Follow-up visit 07/09/2018 Restarted chemotherapy for cervical cancer. She reports that her diarrhea recurred after starting chemotherapy. She reports lower abdominal cramps as well. Currently, experiencing very loose, muddy like bowel movements several times a day and sometimes at night. She is on cholestyramine 2 packets daily, Imodium twice daily, Lomotil twice daily. She had repeat stool studies by Dr. Mike Gip on and negative for infection. Her weight has been stable, she denies nausea, appetite is intact. She was also  taking oral magnesium which has been just stopped  Follow-up visit 08/19/2018 Patient made an urgent visit today due to worsening diarrhea since starting antibiotics for dental infection.  She is currently on amoxicillin.  She reports that for the last 1 month her diarrhea has been intermittent, had good days.  She reports the diarrhea really got worse after starting antibiotics.  Her last chemo was about a month ago.  She is waiting for her WBC count to be improved before starting next cycle of chemo.  She is currently on colestipol 1 pill daily, Viberzi 100 mg twice daily, started 2 days ago, tincture of opium 3 times daily, Lomotil once daily, budesonide 3 mg 3 pills daily.  Patient was hospitalized to [facility] few weeks ago, underwent EGD and colonoscopy.  Colonoscopy was unremarkable including biopsies.  EGD revealed moderate villous blunting on duodenal biopsies. However her TTG IgA was negative in the past.  Therefore, I started her empirically on budesonide She is tearful due to ongoing diarrhea, incontinence  Follow-up visit 08/28/2018 Patient made an urgent visit to see me today at the request of her oncologist, Dr. Mike Gip because of low serum cortisol levels.  Her morning cortisol level was 2.7 mcg/dL on 08/20/2018.  Patient is in pleasant mood today as her diarrhea significantly improved.  Currently having upto 2, pudding like stools daily without nocturnal diarrhea.  She thinks adding amitriptyline definitely helped with her diarrhea as well as her mood.  She states that she is not "weepy" like she used to be.  Viberzi was  also a new medication that was added within last 10 days.  Currently, she is taking budesonide 9 mg daily, Viberzi 100 mg twice daily, Bentyl as needed, tincture of opium 3 times daily, colestipol with each meal.  She is not taking Lomotil regularly.  Planning to undergo fourth cycle of chemo in next 1 to 2 weeks.  She completed a course of antibiotics.  Follow-up visit  10/21/2018 Dr. Mike Gip requested to see this patient today due to ongoing lower abdominal pain associated with diarrhea.  Since last visit, patient underwent fifth cycle of chemotherapy in 09/2018.  Patient was recently hospitalized secondary to hematochezia 10/08/2018 to 09/16/18 and underwent colonoscopy which revealed few aphthae in the neo terminal ileum which were biopsied.  There was no presence of Crohn's disease.  Patient received 2 units of PRBCs in the hospital. The colon was normal.  Xarelto was held.  Patient was rehospitalized on 10/17/2018 after a CT of the abdomen pelvis which was concerning for small bowel obstruction.  The CT was originally ordered by her PCP due to vaginal bleeding.  Patient was evaluated by surgery who were not concerned about small bowel obstruction and was discharged home.  Patient reports having intermittent episodes of diarrhea associated with severe lower abdominal cramps and nausea.  She is taking colestipol 3 times a day, Viberzi 100 mg twice daily, budesonide 3 mg 3 times daily, hydrocortisone 5 mg 3 times a day and amitriptyline 50 mg daily.  She has appointment with endocrinology in 02/2019.  She reports having severe lower abdominal cramps when she has a bowel movement which causes nausea and emesis.  She is able to maintain weight.  She reports abdominal bloating as well.  She denies rectal bleeding or black-colored stools.  Her hemoglobin today at cancer center is 13.2.  Patient is tearful with ongoing diarrhea that has caused few episodes of fecal incontinence.  Follow-up visit 11/13/2018 Patient is seeing me at request of Dr. Mike Gip due to recent dark-colored stools and drop in hemoglobin.  Patient was seen by Dr.Cochran last week patient expressed concern that her stools were almost black.  And her hemoglobin dropped from 11.4 to 9.8.  Therefore, she held her Xarelto.  Patient also underwent complete dental extraction and recovering well.  Her hemoglobin today is  9.8.  Patient reports that her diarrhea is mostly intermittent at this time and she mostly has good days in a week.  She has been taking combination of several antidiarrheal medications.  She restarted tincture of opium as well.  She is curious to restart her chemo because she thinks her diarrhea will behave the same no matter she is on chemo or not and this has remained unchanged even when she was receiving chemo in [place].  She is waiting from her insurance to receive approval to see [facility] for second opinion.  She has appointment with endocrinology in May.  Patient gained about 6 pounds and seems to be in good spirits.  She is going to restart Xarelto today.  She reported that she had episodes of foamy, yellow foul-smelling stool on Sunday, but did not have any diarrhea since then.  She has seen Dr. Mike Gip today and stool studies were ordered.  Patient also tried octreotide for chronic diarrhea but did not seem to help  She denies smoking or drinking alcohol.   NSAIDs: Has stopped taking NSAIDs  Antiplts/Anticoagulants/Anti thrombotics: on Xarelto for factor V bleeding mutation  GI Procedures:  Colonoscopy 10/10/2018 The perianal and  digital rectal examinations were normal. There was evidence of a prior end-to-side ileo-colonic anastomosis in the transverse colon. This was patent and was characterized by healthy appearing mucosa. The anastomosis was traversed. 3 aphthous ulcers in the neoterminal ileum, biopsied DIAGNOSIS:  A. TERMINAL ILEUM, ULCER; COLD BIOPSY:  - ENTERIC MUCOSA WITH NORMAL VILLOUS ARCHITECTURE.  - INFLAMED SQUAMOUS MUCOSA.  - FRAGMENTS OF FIBRINOPURULENT ULCER DEBRIS.  - NEGATIVE FOR DYSPLASIA AND MALIGNANCY.  Immunohistochemical studies directed against HSV 1/2 and CMV, with  appropriately reactive controls, are negative. A GMS special stain is  negative for fungal organisms.   EGD and colonoscopy 07/30/2018 DIAGNOSIS:  A. DUODENUM; COLD BIOPSY:  -  MODERATE DUODENOPATHY WITH VILLOUS BLUNTING, SEE COMMENT.   Comment:  The duodenal biopsies show shortened and shrunken villi with reactive  changes of surface epithelium. Intraepithelial lymphocytes are not  increased. Peptic duodenitis and medication effect should be considered.  The features are not typical for malabsorption, but clinical correlation  is recommended.   B. NEO-TERMINAL ILEUM; COLD BIOPSY:  - ILEAL MUCOSA WITH INTACT VILLI.  - NEGATIVE FOR ACTIVE INFLAMMATION, INTRAEPITHELIAL LYMPHOCYTOSIS,  INFECTIOUS AGENTS, AND GRANULOMAS.  - NEGATIVE FOR DYSPLASIA AND MALIGNANCY.   C. COLON; RANDOM COLD BIOPSY:  - NONSPECIFIC CRYPT HYPERPLASIA.  - NEGATIVE FOR ACTIVE INFLAMMATION AND MICROSCOPIC COLITIS.  - NEGATIVE FOR DYSPLASIA AND MALIGNANCY.   EGD 12/30/2017 - Normal duodenal bulb and second portion of the duodenum. - Erythematous mucosa in the stomach. Biopsied. - Normal gastroesophageal junction and esophagus.  Colonoscopy 12/30/2017 - Perianal condylomata found on perianal exam. - Patent end-to-side ileo-colonic anastomosis, characterized by healthy appearing mucosa. - The examined portion of the ileum was normal. Biopsied. - Multiple non-bleeding colonic angioectasias. - Normal mucosa in the entire examined colon. Biopsied. DIAGNOSIS:  A. STOMACH, ANTRUM; COLD BIOPSY:  - MILD CHRONIC GASTRITIS AND REGENERATIVE/REPARATIVE CHANGE.  - NEGATIVE FOR H. PYLORI, DYSPLASIA AND MALIGNANCY.   B. STOMACH, BODY; COLD BIOPSY:  - MINIMAL CHRONIC GASTRITIS AND REGENERATIVE/REPARATIVE CHANGE.  - NEGATIVE FOR H. PYLORI, DYSPLASIA AND MALIGNANCY.   Note: The histologic findings in the antrum and body resemble reactive  gastropathy. Etiologies include drugs/chemical injury (NSAIDs vs.  other), bile reflux, and changes adjacent to an area of healing  ulceration. Clinical correlation with endoscopic findings is required.   C. TERMINAL ILEUM; COLD BIOPSY:  - PARTIALLY  FRAGMENTED SMALL BOWEL MUCOSA WITH OVERALL INTACT VILLOUS  ARCHITECTURE.  - NEGATIVE FOR INTRAEPITHELIAL LYMPHOCYTOSIS, DYSPLASIA AND MALIGNANCY.   D. RANDOM COLON; COLD BIOPSY:  - COLONIC MUCOSA NEGATIVE FOR MICROSCOPIC COLITIS, DYSPLASIA AND  MALIGNANCY.   Colonoscopy at [facility] 10/05/2016 Impression:           - One diminutive polyp in the cecum,                        removed with a cold biopsy forceps.                        Resected and retrieved.                       - One 5 mm polyp in the cecum,                        removed with a jumbo cold forceps.  Resected and retrieved.                       - One 6 mm polyp in the cecum,                        removed with a hot snare. Resected                        and retrieved.                       - One 5 mm polyp in the descending                        colon, removed with a jumbo cold                        forceps. Resected and retrieved. Date Collected: 10/05/2016 14:48 Diagnosis A.COLON, LABELED AS "CECAL POLYP", BIOPSY:  TUBULAR ADENOMA  SESSILE SERRATED ADENOMA  B.COLON, LABELED AS "TRANSVERSE COLON POLYP", BIOPSY:  TUBULAR ADENOMA  She denies family history of celiac disease, autoimmune conditions, IBD, GI malignancy  Past Medical History:  Diagnosis Date  . Abnormal cervical Papanicolaou smear 09/18/2017  . Anxiety   . Aortic atherosclerosis ([place])   . Arthritis    neck and knees  . 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 Blood transfusion without reported diagnosis   . Cervical cancer ([place]) 09/2016   mets lung  . Chronic anal fissure   . Chronic diarrhea   . Dyspnea   . Erosive gastropathy 09/18/2017  . Factor V Leiden mutation ([place])   . Fecal incontinence   . Genital warts   . GERD (gastroesophageal reflux disease)   . Heart murmur   . Hemorrhoids   . 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 Hepatitis, chronic ([place]) 05/05/2017  .  History of cancer chemotherapy    completed 06/2017  . History of Clostridium difficile infection    while undergoing chemo.  Negative test 10/2017  . Ileocolic anastomotic leak   . 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 Intestinal infection due to Clostridium difficile 09/18/2017  . Macrocytic anemia with vitamin B12 deficiency   . Multiple gastric ulcers   . Nausea vomiting and diarrhea   . Pancolitis ([place]) 07/27/2018  . Perianal condylomata   . Pneumonia    History of  . Pulmonary nodules   . Rectal bleeding   . Small bowel obstruction ([place]) 08/2017  . Stiff neck    limited right turn  . Vitamin D deficiency     Past Surgical History:  Procedure Laterality Date  . CHOLECYSTECTOMY    . COLON SURGERY  08/2017   resection  . COLONOSCOPY WITH PROPOFOL N/A 12/20/2017   Procedure: COLONOSCOPY WITH PROPOFOL;  Surgeon: [name], MD;  Location: [facility] ENDOSCOPY;  Service: Gastroenterology;  Laterality: N/A;  . COLONOSCOPY WITH PROPOFOL N/A 07/30/2018   Procedure: COLONOSCOPY WITH PROPOFOL;  Surgeon: [name], MD;  Location: [facility] ENDOSCOPY;  Service: Gastroenterology;  Laterality: N/A;  . COLONOSCOPY WITH PROPOFOL N/A 10/10/2018   Procedure: COLONOSCOPY WITH PROPOFOL;  Surgeon: Lucilla Lame, MD;  Location: [facility] ENDOSCOPY;  Service: Endoscopy;  Laterality: N/A;  . DIAGNOSTIC LAPAROSCOPY    .  ESOPHAGOGASTRODUODENOSCOPY (EGD) WITH PROPOFOL N/A 12/20/2017   Procedure: ESOPHAGOGASTRODUODENOSCOPY (EGD) WITH PROPOFOL;  Surgeon: [name], MD;  Location: [place];  Service: Gastroenterology;  Laterality: N/A;  . ESOPHAGOGASTRODUODENOSCOPY (EGD) WITH PROPOFOL  07/30/2018   Procedure: ESOPHAGOGASTRODUODENOSCOPY (EGD) WITH PROPOFOL;  Surgeon: [name], MD;  Location: [facility] ENDOSCOPY;  Service: Gastroenterology;;  . LAPAROTOMY N/A 08/31/2017   Procedure: EXPLORATORY LAPAROTOMY for SBO, ileocolectomy, removal of piece of uterine wall;   Surgeon: [name], MD;  Location: [facility] ORS;  Service: General;  Laterality: N/A;  . LASER ABLATION CONDOLAMATA N/A 02/22/2018   Procedure: LASER ABLATION/REMOVAL OF GTXMIWOEHOZ AROUND ANUS AND VAGINA;  Surgeon: Michael Boston, MD;  Location: [place];  Service: General;  Laterality: N/A;  . OOPHORECTOMY    . PORTA CATH INSERTION N/A 05/13/2018   Procedure: PORTA CATH INSERTION;  Surgeon: Katha Cabal, MD;  Location: [place] CV LAB;  Service: Cardiovascular;  Laterality: N/A;  . SMALL INTESTINE SURGERY    . TANDEM RING INSERTION     x3  . THORACOTOMY    . TOTAL KNEE ARTHROPLASTY Left 04/24/2018   Procedure: TOTAL KNEE ARTHROPLASTY;  Surgeon: Lovell Sheehan, MD;  Location: [facility] ORS;  Service: Orthopedics;  Laterality: Left;    Current Outpatient Medications:  .  amitriptyline (ELAVIL) 75 MG tablet, Take 1 tablet (75 mg total) by mouth at bedtime., Disp: 90 tablet, Rfl: 1 .  budesonide (ENTOCORT EC) 3 MG 24 hr capsule, Take 3 capsules (9 mg total) by mouth daily., Disp: 30 capsule, Rfl: 0 .  Calcium Carb-Cholecalciferol (CALCIUM 500 +D) 500-400 MG-UNIT TABS, Take 2 tablets by mouth daily., Disp: , Rfl:  .  diphenoxylate-atropine (LOMOTIL) 2.5-0.025 MG tablet, Take 2 tablets by mouth 4 (four) times daily., Disp: 30 tablet, Rfl: 0 .  hydrocortisone (CORTEF) 10 MG tablet, Take 1 tablet (10 mg total) by mouth 2 (two) times daily., Disp: 180 tablet, Rfl: 0 .  hyoscyamine (LEVBID) 0.375 MG 12 hr tablet, Take 1 tablet (0.375 mg total) by mouth 2 (two) times daily., Disp: 60 tablet, Rfl: 0 .  Multiple Vitamins-Minerals (MULTIVITAMIN WITH MINERALS) tablet, Take 1 tablet by mouth daily., Disp: 30 tablet, Rfl: 0 .  octreotide (SANDOSTATIN LAR DEPOT) 20 MG injection, Inject 20 mg into the muscle every 28 (twenty-eight) days., Disp: 1 each, Rfl: 0 .  ondansetron (ZOFRAN) 8 MG tablet, Take 1 tablet (8 mg total) by mouth every 8 (eight) hours as needed for nausea or vomiting.,  Disp: 30 tablet, Rfl: 1 .  oxyCODONE (OXY IR/ROXICODONE) 5 MG immediate release tablet, Take 1 tablet (5 mg total) by mouth every 6 (six) hours as needed for moderate pain or severe pain., Disp: 30 tablet, Rfl: 0 .  pantoprazole (PROTONIX) 40 MG tablet, Take 1 tablet (40 mg total) by mouth daily., Disp: 90 tablet, Rfl: 0 .  potassium chloride SA (K-DUR,KLOR-CON) 20 MEQ tablet, Take 1 tablet (20 mEq total) by mouth daily. for 4 days., Disp: 30 tablet, Rfl: 0 .  promethazine (PHENERGAN) 25 MG tablet, Take 1 tablet (25 mg total) by mouth every 6 (six) hours as needed for nausea or vomiting., Disp: 30 tablet, Rfl: 0 .  VENTOLIN HFA 108 (90 Base) MCG/ACT inhaler, Inhale 1-2 puffs into the lungs every 4 (four) hours as needed for shortness of breath. , Disp: , Rfl: 0 .  VIBERZI 100 MG TABS, TAKE 1 TABLET BY MOUTH TWICE DAILY WITH  A  MEAL, Disp: 60 tablet, Rfl: 0 No current facility-administered  medications for this visit.   Facility-Administered Medications Ordered in Other Visits:  .  heparin lock flush 100 unit/mL, 500 Units, Intravenous, Once, Corcoran, Melissa C, MD .  sodium chloride 0.9 % 1,000 mL with potassium chloride 20 mEq infusion, , Intravenous, Once, Karen Kitchens, NP   Family History  Problem Relation Age of Onset  . Hypertension Father   . Diabetes Father   . Alcohol abuse Daughter   . Hypertension Maternal Grandmother   . Diabetes Maternal Grandmother   . Diabetes Paternal Grandmother   . Hypertension Paternal Grandmother      Social History   Tobacco Use  . Smoking status: Former Smoker    Last attempt to quit: 10/16/2006    Years since quitting: 12.0  . Smokeless tobacco: Never Used  Substance Use Topics  . Alcohol use: Not Currently    Frequency: Never    Comment: seldom  . Drug use: Yes    Types: Marijuana    Allergies as of 11/13/2018 - Review Complete 11/13/2018  Allergen Reaction Noted  . Ketamine Anxiety and Other (See Comments) 02/22/2017    Review of  Systems:    All systems reviewed and negative except where noted in HPI.   Physical Exam:  BP (!) 147/86 (BP Location: Left Arm, Patient Position: Sitting, Cuff Size: Large)   Pulse 70   Resp 17   Ht 5' 8"  (1.727 m)   Wt 178 lb 9.6 oz (81 kg)   SpO2 99%   BMI 27.16 kg/m  No LMP recorded. Patient is postmenopausal.  General:   Alert,  Well-developed, well-nourished, pleasant and cooperative in NAD Head:  Normocephalic and atraumatic. Eyes:  Sclera clear, no icterus.   Conjunctiva pink. Ears:  Normal auditory acuity. Nose:  No deformity, discharge, or lesions. Mouth:  No deformity or lesions,oropharynx pink & moist. Neck:  Supple; no masses or thyromegaly. Lungs:  Respirations even and unlabored.  Clear throughout to auscultation.   No wheezes, crackles, or rhonchi. No acute distress. Heart:  Regular rate and rhythm; no murmurs, clicks, rubs, or gallops. Abdomen:  Normal bowel sounds. Soft,mild diffuse lower abdominal tenderness, and non-distended without masses, hepatosplenomegaly or hernias noted.  No guarding or rebound tenderness.   Rectal: Not performed Msk:  Symmetrical without gross deformities. Good, equal movement & strength bilaterally. Pulses:  Normal pulses noted. Extremities:  No clubbing or edema.  No cyanosis. Neurologic:  Alert and oriented x3;  grossly normal neurologically. Skin:  Intact without significant lesions or rashes. No jaundice. Lymph Nodes:  No significant cervical adenopathy. Psych:  Alert and cooperative.  Sad mood and affect.  Imaging Studies: reviewed  Assessment and Plan:   Joanna Hall is a 53 y.o. Caucasian female with history of chronic hepatitis C, treatment nave, stage IV cervical adenocarcinoma, status post chemotherapy XRT and brachytherapy, ongoing treatment at [facility] center, complicated by radiation-induced small bowel obstruction, S/P exploratory laparoscopy with ileocolectomy in 08/2017 seen in follow-up for chronic  hepatitis C, chronic diarrhea  Chronic hep C: Genotype 2a/2c, VL 82,400  Treatment nave, plan is to initiate [place] for 8 weeks  Patient is currently undergoing chemotherapy for cervical cancer There is no evidence of chronic liver disease She had alkaline phosphatase Isoenzymes and liver component is elevated, GGT 209, elevated Hepatitis B surface antigen negative HIV nonreactive hepatitis B surface antibody, core antibody negative Other secondary causes for elevated LFTs are unremarkable  Chronic nonbloody diarrhea: It has been quite challenging to treat her diarrhea despite  trying combination of various antidiarrheal medications.  She has history of ileocolectomy. Pancreatic fecal elastase, fecal calprotectin are normal, CRP came back normal. No evidence of microscopic colitis. Worsening of diarrhea after chemotherapy, recent EGD revealed a moderate villous blunting on the duodenal biopsies.  Repeat colonoscopy was unremarkable for colonic pathology.  Most recent colonoscopy revealed 3 after in the neoterminal ileum with no evidence of chronicity.  These ulcers are nonspecific and unclear etiology. Morning serum cortisol levels came back low at 2.7 mcg/dL. 5HIAA normal, celiac serologies TTG and DGP negative.  Serum gastrin mildly elevated which is clinically insignificant. Chromogranin A and VIP levels returned normal.  Cross-sectional imaging has been unremarkable without evidence of space-occupying lesions in the GI tract. Her diarrhea has been transiently responding to the above regimen.  She reports that she feels good for 2 to 3 days then her diarrhea recurs with severe lower abdominal cramps associated with nausea. Due to low serum cortisol levels, I referred her to endocrine to evaluate for adrenal insufficiency, appointment scheduled for 02/2019.  In the meantime, I started her on hydrocortisone 10 mg 2 times a day.  I deferred VCE due to concern for retained capsule given her history of  small bowel obstruction, ex lap    -Continue colestipol 1 pill each meal -Continue budesonide 3 mg 3 capsules daily -Continue amitriptyline to 75 mg at bedtime -Continue Viberzi 100 mg twice daily -Continue hydrocortisone 10 mg 2 times daily -Levbid 0.375 milligrams twice daily for abdominal cramps -I referred her to [facility] for second opinion.  She is waiting to hear back from them.  I will also refer her to [facility].  She will see either one at an earlier appointment date -After having a lengthy discussion with patient and the nature of her chronic diarrhea, it would be reasonable to restart her chemotherapy  Chronic anal fissure and symptomatic hemorrhoids: - Her fissure has significantly improved - I will defer outpatient hemorrhoid ligation for now - she underwent removal of anal condylomata  Follow up in 1 month or sooner if diarrhea persists  [name], MD

## 2018-11-14 ENCOUNTER — Telehealth: Payer: Self-pay | Admitting: Gastroenterology

## 2018-11-14 ENCOUNTER — Telehealth: Payer: Self-pay | Admitting: *Deleted

## 2018-11-14 ENCOUNTER — Telehealth: Payer: Self-pay

## 2018-11-14 NOTE — Telephone Encounter (Signed)
Dr [name] form [name] calling to speak with Dr Mike Gip regarding this patient Please return his call on his cell [phone]

## 2018-11-14 NOTE — Telephone Encounter (Signed)
Spoke with Joanna Hall to see when would she like to start her Chemo , per Dr Mike Gip. The patient  would like to start as soon as possible. Message given to Dr. Mike Gip. The patient was also inform she will be getting a call with appointment time and date.

## 2018-11-14 NOTE — Telephone Encounter (Signed)
[facility] is calling to inform us the referral for a consult has been approved they will reach out to patient to schedule

## 2018-11-18 ENCOUNTER — Encounter: Payer: Self-pay | Admitting: Urgent Care

## 2018-11-19 ENCOUNTER — Encounter: Payer: Self-pay | Admitting: Urgent Care

## 2018-11-19 ENCOUNTER — Encounter: Payer: Self-pay | Admitting: Hematology and Oncology

## 2018-11-19 ENCOUNTER — Inpatient Hospital Stay: Payer: Medicaid Other | Attending: Hematology and Oncology | Admitting: Hematology and Oncology

## 2018-11-19 ENCOUNTER — Inpatient Hospital Stay
Admission: AD | Admit: 2018-11-19 | Discharge: 2018-11-26 | DRG: 393 | Disposition: A | Discharge status: Home / Self Care | Payer: Medicaid Other | Source: Ambulatory Visit | Attending: Internal Medicine | Admitting: Internal Medicine

## 2018-11-19 ENCOUNTER — Inpatient Hospital Stay: Payer: Medicaid Other

## 2018-11-19 ENCOUNTER — Other Ambulatory Visit: Payer: Self-pay | Admitting: Radiology

## 2018-11-19 ENCOUNTER — Other Ambulatory Visit: Payer: Self-pay

## 2018-11-19 VITALS — BP 132/92 | HR 90 | Temp 97.2°F | Resp 18 | Ht 68.0 in | Wt 171.1 lb

## 2018-11-19 DIAGNOSIS — D61810 Antineoplastic chemotherapy induced pancytopenia: Secondary | ICD-10-CM

## 2018-11-19 DIAGNOSIS — D61818 Other pancytopenia: Secondary | ICD-10-CM | POA: Diagnosis not present

## 2018-11-19 DIAGNOSIS — C7800 Secondary malignant neoplasm of unspecified lung: Secondary | ICD-10-CM | POA: Diagnosis not present

## 2018-11-19 DIAGNOSIS — C539 Malignant neoplasm of cervix uteri, unspecified: Secondary | ICD-10-CM | POA: Diagnosis present

## 2018-11-19 DIAGNOSIS — K922 Gastrointestinal hemorrhage, unspecified: Secondary | ICD-10-CM | POA: Diagnosis present

## 2018-11-19 DIAGNOSIS — Z833 Family history of diabetes mellitus: Secondary | ICD-10-CM

## 2018-11-19 DIAGNOSIS — Z8711 Personal history of peptic ulcer disease: Secondary | ICD-10-CM

## 2018-11-19 DIAGNOSIS — K648 Other hemorrhoids: Secondary | ICD-10-CM | POA: Diagnosis present

## 2018-11-19 DIAGNOSIS — I700 Atherosclerosis of aorta: Secondary | ICD-10-CM | POA: Diagnosis present

## 2018-11-19 DIAGNOSIS — D6851 Activated protein C resistance: Secondary | ICD-10-CM | POA: Diagnosis present

## 2018-11-19 DIAGNOSIS — T189XXS Foreign body of alimentary tract, part unspecified, sequela: Secondary | ICD-10-CM

## 2018-11-19 DIAGNOSIS — Z884 Allergy status to anesthetic agent status: Secondary | ICD-10-CM

## 2018-11-19 DIAGNOSIS — Z87891 Personal history of nicotine dependence: Secondary | ICD-10-CM

## 2018-11-19 DIAGNOSIS — Z7189 Other specified counseling: Secondary | ICD-10-CM

## 2018-11-19 DIAGNOSIS — Z9049 Acquired absence of other specified parts of digestive tract: Secondary | ICD-10-CM

## 2018-11-19 DIAGNOSIS — K529 Noninfective gastroenteritis and colitis, unspecified: Secondary | ICD-10-CM

## 2018-11-19 DIAGNOSIS — T451X5A Adverse effect of antineoplastic and immunosuppressive drugs, initial encounter: Secondary | ICD-10-CM

## 2018-11-19 DIAGNOSIS — K625 Hemorrhage of anus and rectum: Secondary | ICD-10-CM | POA: Diagnosis not present

## 2018-11-19 DIAGNOSIS — C538 Malignant neoplasm of overlapping sites of cervix uteri: Secondary | ICD-10-CM

## 2018-11-19 DIAGNOSIS — K627 Radiation proctitis: Secondary | ICD-10-CM | POA: Diagnosis present

## 2018-11-19 DIAGNOSIS — Z96652 Presence of left artificial knee joint: Secondary | ICD-10-CM | POA: Diagnosis present

## 2018-11-19 DIAGNOSIS — R1031 Right lower quadrant pain: Secondary | ICD-10-CM | POA: Diagnosis present

## 2018-11-19 DIAGNOSIS — C784 Secondary malignant neoplasm of small intestine: Secondary | ICD-10-CM | POA: Diagnosis present

## 2018-11-19 DIAGNOSIS — M47812 Spondylosis without myelopathy or radiculopathy, cervical region: Secondary | ICD-10-CM | POA: Diagnosis present

## 2018-11-19 DIAGNOSIS — E559 Vitamin D deficiency, unspecified: Secondary | ICD-10-CM | POA: Diagnosis present

## 2018-11-19 DIAGNOSIS — C799 Secondary malignant neoplasm of unspecified site: Secondary | ICD-10-CM | POA: Diagnosis not present

## 2018-11-19 DIAGNOSIS — E860 Dehydration: Secondary | ICD-10-CM

## 2018-11-19 DIAGNOSIS — M170 Bilateral primary osteoarthritis of knee: Secondary | ICD-10-CM | POA: Diagnosis present

## 2018-11-19 DIAGNOSIS — Z79899 Other long term (current) drug therapy: Secondary | ICD-10-CM

## 2018-11-19 DIAGNOSIS — Z5181 Encounter for therapeutic drug level monitoring: Secondary | ICD-10-CM | POA: Insufficient documentation

## 2018-11-19 DIAGNOSIS — K921 Melena: Secondary | ICD-10-CM | POA: Diagnosis not present

## 2018-11-19 DIAGNOSIS — G893 Neoplasm related pain (acute) (chronic): Secondary | ICD-10-CM

## 2018-11-19 DIAGNOSIS — Z79891 Long term (current) use of opiate analgesic: Secondary | ICD-10-CM

## 2018-11-19 DIAGNOSIS — Z9221 Personal history of antineoplastic chemotherapy: Secondary | ICD-10-CM

## 2018-11-19 DIAGNOSIS — R197 Diarrhea, unspecified: Secondary | ICD-10-CM

## 2018-11-19 DIAGNOSIS — F1190 Opioid use, unspecified, uncomplicated: Secondary | ICD-10-CM

## 2018-11-19 DIAGNOSIS — B1920 Unspecified viral hepatitis C without hepatic coma: Secondary | ICD-10-CM | POA: Diagnosis present

## 2018-11-19 DIAGNOSIS — Z8249 Family history of ischemic heart disease and other diseases of the circulatory system: Secondary | ICD-10-CM | POA: Diagnosis not present

## 2018-11-19 DIAGNOSIS — K219 Gastro-esophageal reflux disease without esophagitis: Secondary | ICD-10-CM | POA: Diagnosis present

## 2018-11-19 DIAGNOSIS — Z923 Personal history of irradiation: Secondary | ICD-10-CM

## 2018-11-19 DIAGNOSIS — K591 Functional diarrhea: Secondary | ICD-10-CM | POA: Diagnosis not present

## 2018-11-19 DIAGNOSIS — D649 Anemia, unspecified: Secondary | ICD-10-CM

## 2018-11-19 DIAGNOSIS — E876 Hypokalemia: Secondary | ICD-10-CM

## 2018-11-19 DIAGNOSIS — D500 Iron deficiency anemia secondary to blood loss (chronic): Secondary | ICD-10-CM

## 2018-11-19 LAB — URINE DRUG SCREEN, QUALITATIVE (ARMC ONLY)
AMPHETAMINES, UR SCREEN: NOT DETECTED
Barbiturates, Ur Screen: NOT DETECTED
Benzodiazepine, Ur Scrn: NOT DETECTED
Cannabinoid 50 Ng, Ur ~~LOC~~: POSITIVE — AB
Cocaine Metabolite,Ur ~~LOC~~: NOT DETECTED
MDMA (ECSTASY) UR SCREEN: NOT DETECTED
Methadone Scn, Ur: NOT DETECTED
Opiate, Ur Screen: NOT DETECTED
Phencyclidine (PCP) Ur S: NOT DETECTED
Tricyclic, Ur Screen: POSITIVE — AB

## 2018-11-19 LAB — CBC WITH DIFFERENTIAL/PLATELET
Abs Immature Granulocytes: 0 10*3/uL (ref 0.00–0.07)
Basophils Absolute: 0 10*3/uL (ref 0.0–0.1)
Basophils Relative: 0 %
Eosinophils Absolute: 0 10*3/uL (ref 0.0–0.5)
Eosinophils Relative: 1 %
HCT: 36.8 % (ref 36.0–46.0)
Hemoglobin: 11.8 g/dL — ABNORMAL LOW (ref 12.0–15.0)
Immature Granulocytes: 0 %
Lymphocytes Relative: 26 %
Lymphs Abs: 0.7 10*3/uL (ref 0.7–4.0)
MCH: 34.5 pg — ABNORMAL HIGH (ref 26.0–34.0)
MCHC: 32.1 g/dL (ref 30.0–36.0)
MCV: 107.6 fL — ABNORMAL HIGH (ref 80.0–100.0)
Monocytes Absolute: 0.2 10*3/uL (ref 0.1–1.0)
Monocytes Relative: 6 %
Neutro Abs: 1.8 10*3/uL (ref 1.7–7.7)
Neutrophils Relative %: 67 %
Platelets: 142 10*3/uL — ABNORMAL LOW (ref 150–400)
RBC: 3.42 MIL/uL — ABNORMAL LOW (ref 3.87–5.11)
RDW: 14.6 % (ref 11.5–15.5)
WBC: 2.6 10*3/uL — ABNORMAL LOW (ref 4.0–10.5)
nRBC: 0 % (ref 0.0–0.2)

## 2018-11-19 LAB — HEMOGLOBIN AND HEMATOCRIT, BLOOD
HCT: 29.5 % — ABNORMAL LOW (ref 36.0–46.0)
Hemoglobin: 9.5 g/dL — ABNORMAL LOW (ref 12.0–15.0)

## 2018-11-19 LAB — CBC
HEMATOCRIT: 30.6 % — AB (ref 36.0–46.0)
Hemoglobin: 10 g/dL — ABNORMAL LOW (ref 12.0–15.0)
MCH: 34.8 pg — ABNORMAL HIGH (ref 26.0–34.0)
MCHC: 32.7 g/dL (ref 30.0–36.0)
MCV: 106.6 fL — ABNORMAL HIGH (ref 80.0–100.0)
NRBC: 0 % (ref 0.0–0.2)
Platelets: 121 10*3/uL — ABNORMAL LOW (ref 150–400)
RBC: 2.87 MIL/uL — ABNORMAL LOW (ref 3.87–5.11)
RDW: 14.5 % (ref 11.5–15.5)
WBC: 3.3 10*3/uL — ABNORMAL LOW (ref 4.0–10.5)

## 2018-11-19 LAB — BASIC METABOLIC PANEL WITH GFR
Anion gap: 6 (ref 5–15)
BUN: 14 mg/dL (ref 6–20)
CO2: 28 mmol/L (ref 22–32)
Calcium: 9.1 mg/dL (ref 8.9–10.3)
Chloride: 103 mmol/L (ref 98–111)
Creatinine, Ser: 0.88 mg/dL (ref 0.44–1.00)
GFR calc Af Amer: >60 mL/min (ref >60)
GFR calc non Af Amer: >60 mL/min (ref >60)
Glucose, Bld: 107 mg/dL — ABNORMAL HIGH (ref 70–99)
Potassium: 3.8 mmol/L (ref 3.5–5.1)
Sodium: 137 mmol/L (ref 135–145)

## 2018-11-19 LAB — MAGNESIUM: Magnesium: 1.5 mg/dL — ABNORMAL LOW (ref 1.7–2.4)

## 2018-11-19 MED ORDER — OXYCODONE HCL 5 MG PO TABS
5.0000 mg | ORAL_TABLET | Freq: Four times a day (QID) | ORAL | Status: DC | PRN
  Administered 2018-11-19: 5 mg via ORAL
  Filled 2018-11-19: qty 1

## 2018-11-19 MED ORDER — SODIUM CHLORIDE 0.9 % IV SOLN
INTRAVENOUS | Status: DC
  Administered 2018-11-19: 15:00:00 via INTRAVENOUS
  Filled 2018-11-19 (×2): qty 250

## 2018-11-19 MED ORDER — ALBUTEROL SULFATE (2.5 MG/3ML) 0.083% IN NEBU: 3.0000 mL | INHALATION_SOLUTION | RESPIRATORY_TRACT | Status: DC | PRN

## 2018-11-19 MED ORDER — ONDANSETRON HCL 4 MG PO TABS: 4.0000 mg | ORAL_TABLET | Freq: Four times a day (QID) | ORAL | Status: DC | PRN

## 2018-11-19 MED ORDER — SODIUM CHLORIDE 0.9 % IV SOLN
INTRAVENOUS | Status: DC
  Administered 2018-11-19 – 2018-11-20 (×2): via INTRAVENOUS

## 2018-11-19 MED ORDER — HYDROCORTISONE 10 MG PO TABS
10.0000 mg | ORAL_TABLET | Freq: Two times a day (BID) | ORAL | Status: DC
  Administered 2018-11-19 – 2018-11-26 (×12): 10 mg via ORAL
  Filled 2018-11-19 (×15): qty 1

## 2018-11-19 MED ORDER — HYDROCODONE-ACETAMINOPHEN 5-325 MG PO TABS
1.0000 | ORAL_TABLET | Freq: Once | ORAL | Status: AC
  Administered 2018-11-19: 1 via ORAL
  Filled 2018-11-19: qty 1

## 2018-11-19 MED ORDER — PANTOPRAZOLE SODIUM 40 MG IV SOLR
40.0000 mg | Freq: Two times a day (BID) | INTRAVENOUS | Status: DC
  Administered 2018-11-19 – 2018-11-22 (×5): 40 mg via INTRAVENOUS
  Filled 2018-11-19 (×5): qty 40

## 2018-11-19 MED ORDER — SODIUM CHLORIDE 0.9 % IV SOLN
Freq: Once | INTRAVENOUS | Status: AC
  Administered 2018-11-19: 11:00:00 via INTRAVENOUS
  Filled 2018-11-19: qty 1000

## 2018-11-19 MED ORDER — OXYCODONE HCL 5 MG PO TABS
10.0000 mg | ORAL_TABLET | Freq: Four times a day (QID) | ORAL | Status: DC | PRN
  Administered 2018-11-19: 5 mg via ORAL
  Administered 2018-11-20 – 2018-11-25 (×6): 10 mg via ORAL
  Filled 2018-11-19 (×9): qty 2

## 2018-11-19 MED ORDER — ONDANSETRON HCL 4 MG/2ML IJ SOLN
4.0000 mg | Freq: Four times a day (QID) | INTRAMUSCULAR | Status: DC | PRN
  Administered 2018-11-20 – 2018-11-25 (×7): 4 mg via INTRAVENOUS
  Filled 2018-11-19 (×7): qty 2

## 2018-11-19 NOTE — Progress Notes (Signed)
The patient is c/o bright red bleeding from her rectal x 1 day. She is also c/o abdomen cramping with a pain level ( 7)

## 2018-11-19 NOTE — Progress Notes (Signed)
Family Meeting Note  Advance Directive:yes  Today a meeting took place with the Patient.     The following clinical team members were present during this meeting:MD  The following were discussed:Patient's diagnosis: Acute GI bleed, acute right lower quadrant abdominal pain, history of stage IV cervical cancer, hypomagnesemia, diarrhea, history of GERD and gastric ulcers and multiple other medical problems is presenting to hospital as a direct admit for GI bleed.  Hemoglobin at 10.0.  Patient is getting admitted to the hospital and treatment plan of care discussed in detail with the patient.  She verbalized understanding of the plan.    Patient's progosis: Unable to determine and Goals for treatment: Full Code daughter Joanna Hall is the healthcare POA  Additional follow-up to be provided: Hospitalist, gastroenterology and oncology  Time spent during discussion:17 min  Nicholes Mango, MD

## 2018-11-19 NOTE — Progress Notes (Signed)
[facility] day:  11/19/2018   Chief Complaint: Joanna Hall is a 53 y.o. female with stage IV cervical cancer who is seen for assessment on day 44 following cycle #5 carboplatin + paclitaxel.   HPI:  The patient was last seen in the medical oncology clinic on 11/13/2018.  At that time, she felt "about the same".  She was healing well s/p full dental extraction.  Guaiac cards were negative.  Stools were foamy and malodorous.  She remained off Xarelto.  Stool for C diff was requested.  She complained of right knee pain at last visit.  She was interested in right knee surgery.  We discussed a concern regarding her falling counts.  Etiology was unclear.  We discussed consideration of a bone marrow aspirate and biopsy while we were awaiting GI clearance at [facility] for second opinion.  During the interim, she has felt "so so".  Weight is down 7 pounds.  She has had ongoing diarrhea.  She notes increased pain in the RLQ.  She notes a little blood when she wiped yesterday.  She shows a photo from her phone taken today of blood filling up the toilet.  She notes feeling "really crampy since Sunday".  She feels weak and lightheaded today.  Her last dose of Xarelto was on Sunday (11/17/2018).  She denies any vaginal bleeding.  She took an ibuprofen a few days ago.  She was told she has an appointment with a GI physician at [facility] in 06/2019.   Past Medical History:  Diagnosis Date  . Abdominal pain 06/10/2018  . Abnormal cervical Papanicolaou smear 09/18/2017  . Anxiety   . Aortic atherosclerosis ([place])   . Arthritis    neck and knees  . 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 Blood transfusion without reported diagnosis   . Cervical cancer ([place]) 09/2016   mets lung  . Chronic anal fissure   . Chronic diarrhea   . Dyspnea   . Erosive gastropathy 09/18/2017  . Factor V Leiden mutation ([place])   . Fecal incontinence   . Genital warts   . GERD  (gastroesophageal reflux disease)   . Heart murmur   . Hematochezia   . Hemorrhoids   . 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 Hepatitis, chronic ([place]) 05/05/2017  . History of cancer chemotherapy    completed 06/2017  . History of Clostridium difficile infection    while undergoing chemo.  Negative test 10/2017  . Ileocolic anastomotic leak   . 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 Intestinal infection due to Clostridium difficile 09/18/2017  . Macrocytic anemia with vitamin B12 deficiency   . Multiple gastric ulcers   . Nausea vomiting and diarrhea   . Pancolitis ([place]) 07/27/2018  . Perianal condylomata   . Pneumonia    History of  . Pulmonary nodules   . Rectal bleeding   . Small bowel obstruction ([place]) 08/2017  . Stiff neck    limited right turn  . Vitamin D deficiency     Past Surgical History:  Procedure Laterality Date  . CHOLECYSTECTOMY    . COLON SURGERY  08/2017   resection  . COLONOSCOPY WITH PROPOFOL N/A 12/20/2017   Procedure: COLONOSCOPY WITH PROPOFOL;  Surgeon: [name], MD;  Location: [facility] ENDOSCOPY;  Service: Gastroenterology;  Laterality: N/A;  . COLONOSCOPY  WITH PROPOFOL N/A 07/30/2018   Procedure: COLONOSCOPY WITH PROPOFOL;  Surgeon: [name], MD;  Location: [facility] ENDOSCOPY;  Service: Gastroenterology;  Laterality: N/A;  . COLONOSCOPY WITH PROPOFOL N/A 10/10/2018   Procedure: COLONOSCOPY WITH PROPOFOL;  Surgeon: Lucilla Lame, MD;  Location: [facility] ENDOSCOPY;  Service: Endoscopy;  Laterality: N/A;  . DIAGNOSTIC LAPAROSCOPY    . ESOPHAGOGASTRODUODENOSCOPY (EGD) WITH PROPOFOL N/A 12/20/2017   Procedure: ESOPHAGOGASTRODUODENOSCOPY (EGD) WITH PROPOFOL;  Surgeon: [name], MD;  Location: [place];  Service: Gastroenterology;  Laterality: N/A;  . ESOPHAGOGASTRODUODENOSCOPY (EGD) WITH PROPOFOL  07/30/2018   Procedure: ESOPHAGOGASTRODUODENOSCOPY (EGD) WITH PROPOFOL;  Surgeon: [name], MD;  Location: [facility] ENDOSCOPY;  Service: Gastroenterology;;  . LAPAROTOMY N/A 08/31/2017   Procedure: EXPLORATORY LAPAROTOMY for SBO, ileocolectomy, removal of piece of uterine wall;  Surgeon: [name], MD;  Location: [facility] ORS;  Service: General;  Laterality: N/A;  . LASER ABLATION CONDOLAMATA N/A 02/22/2018   Procedure: LASER ABLATION/REMOVAL OF NOTRRNHAFBX AROUND ANUS AND VAGINA;  Surgeon: Michael Boston, MD;  Location: [place];  Service: General;  Laterality: N/A;  . OOPHORECTOMY    . PORTA CATH INSERTION N/A 05/13/2018   Procedure: PORTA CATH INSERTION;  Surgeon: Katha Cabal, MD;  Location: [place] CV LAB;  Service: Cardiovascular;  Laterality: N/A;  . SMALL INTESTINE SURGERY    . TANDEM RING INSERTION     x3  . THORACOTOMY    . TOTAL KNEE ARTHROPLASTY Left 04/24/2018   Procedure: TOTAL KNEE ARTHROPLASTY;  Surgeon: Lovell Sheehan, MD;  Location: [facility] ORS;  Service: Orthopedics;  Laterality: Left;    Family History  Problem Relation Age of Onset  . Hypertension Father   . Diabetes Father   . Alcohol abuse Daughter   . Hypertension Maternal Grandmother   . Diabetes Maternal Grandmother   . Diabetes Paternal Grandmother   . Hypertension Paternal Grandmother     Social History:  reports that she quit smoking about 12 years ago. She has never used smokeless tobacco. She reports previous alcohol use. She reports current drug use. Drug: Marijuana.  The patient's 35 year old daughter died of alcoholism.  The patient is originally from [place].  She moved from [place] to [place]. She is accompanied by her sister, April, today.   Allergies:  Allergies  Allergen Reactions  . Ketamine Anxiety and Other (See Comments)    Syncope episode/confusion     Current Medications: Current Outpatient Medications  Medication Sig Dispense Refill  . amitriptyline (ELAVIL) 75 MG tablet Take 1 tablet (75 mg total) by mouth at bedtime. 90  tablet 1  . budesonide (ENTOCORT EC) 3 MG 24 hr capsule Take 3 capsules (9 mg total) by mouth daily. 30 capsule 0  . Calcium Carb-Cholecalciferol (CALCIUM 500 +D) 500-400 MG-UNIT TABS Take 2 tablets by mouth daily.    . diphenoxylate-atropine (LOMOTIL) 2.5-0.025 MG tablet Take 2 tablets by mouth 4 (four) times daily. 30 tablet 0  . hydrocortisone (CORTEF) 10 MG tablet Take 1 tablet (10 mg total) by mouth 2 (two) times daily. 180 tablet 0  . hyoscyamine (LEVBID) 0.375 MG 12 hr tablet Take 1 tablet (0.375 mg total) by mouth 2 (two) times daily. 60 tablet 0  . Multiple Vitamins-Minerals (MULTIVITAMIN WITH MINERALS) tablet Take 1 tablet by mouth daily. 30 tablet 0  . octreotide (SANDOSTATIN LAR DEPOT) 20 MG injection Inject 20 mg into the muscle every 28 (twenty-eight) days. 1 each 0  . oxyCODONE (OXY IR/ROXICODONE)  5 MG immediate release tablet Take 1 tablet (5 mg total) by mouth every 6 (six) hours as needed for moderate pain or severe pain. 30 tablet 0  . pantoprazole (PROTONIX) 40 MG tablet Take 1 tablet (40 mg total) by mouth daily. 90 tablet 0  . VIBERZI 100 MG TABS TAKE 1 TABLET BY MOUTH TWICE DAILY WITH  A  MEAL 60 tablet 0  . ondansetron (ZOFRAN) 8 MG tablet Take 1 tablet (8 mg total) by mouth every 8 (eight) hours as needed for nausea or vomiting. (Patient not taking: Reported on 11/19/2018) 30 tablet 1  . potassium chloride SA (K-DUR,KLOR-CON) 20 MEQ tablet Take 1 tablet (20 mEq total) by mouth daily. for 4 days. (Patient not taking: Reported on 11/19/2018) 30 tablet 0  . promethazine (PHENERGAN) 25 MG tablet Take 1 tablet (25 mg total) by mouth every 6 (six) hours as needed for nausea or vomiting. (Patient not taking: Reported on 11/19/2018) 30 tablet 0  . VENTOLIN HFA 108 (90 Base) MCG/ACT inhaler Inhale 1-2 puffs into the lungs every 4 (four) hours as needed for shortness of breath.   0   No current facility-administered medications for this visit.    Facility-Administered Medications Ordered  in Other Visits  Medication Dose Route Frequency Provider Last Rate Last Dose  . heparin lock flush 100 unit/mL  500 Units Intravenous Once Corcoran, Melissa C, MD      . sodium chloride 0.9 % 1,000 mL with potassium chloride 20 mEq infusion   Intravenous Once Karen Kitchens, NP         Review of Systems  Constitutional: Positive for malaise/fatigue and weight loss (7 pounds). Negative for chills, diaphoresis and fever.       Feels "so so".  HENT: Negative.  Negative for congestion, ear discharge, ear pain, nosebleeds, sinus pain, sore throat and tinnitus.        S/p full dental extraction.  Dentures planned 4-5 weeks after extractions.  Eyes: Negative.  Negative for blurred vision, double vision, photophobia and pain.  Respiratory: Positive for shortness of breath (exertional). Negative for cough, hemoptysis, sputum production and wheezing.   Cardiovascular: Negative.  Negative for chest pain, palpitations, orthopnea, leg swelling and PND.  Gastrointestinal: Positive for abdominal pain (chronic lower; increased pain RLQ), blood in stool (significant blood in toilet today) and diarrhea (continues). Negative for constipation, melena, nausea and vomiting.  Genitourinary: Negative.  Negative for dysuria, frequency, hematuria and urgency.       No vaginal bleeding.  Musculoskeletal: Negative.  Negative for back pain, falls, joint pain and neck pain.  Skin: Negative.  Negative for itching and rash.  Neurological: Positive for weakness (general). Negative for dizziness, tingling, tremors, sensory change, speech change, focal weakness and headaches.  Endo/Heme/Allergies: Negative.  Does not bruise/bleed easily.  Psychiatric/Behavioral: Negative.  Negative for depression and memory loss. The patient does not have insomnia.   All other systems reviewed and are negative.  Performance status (ECOG): 1-2  Vital Signs BP (!) 132/92 (BP Location: Left Arm, Patient Position: Sitting)   Pulse 90   Temp  (!) 97.2 F (36.2 C) (Tympanic)   Resp 18   Ht 5' 8"  (1.727 m)   Wt 171 lb 1.6 oz (77.6 kg)   SpO2 99%   BMI 26.02 kg/m   Physical Exam  Constitutional: She is oriented to person, place, and time. Vital signs are normal.  Non-toxic appearance. No distress.  Chronically fatigued appearing woman sitting in the exam room in  no acute distress.  HENT:  Head: Normocephalic and atraumatic. Hair is normal (chemotherapy induced alopecia).  Mouth/Throat: Oropharynx is clear and moist and mucous membranes are normal. No oropharyngeal exudate.  Wearing a pink wrap.  Eyes: Pupils are equal, round, and reactive to light. Conjunctivae and EOM are normal. No scleral icterus.  Neck: Normal range of motion. Neck supple. No JVD present.  Cardiovascular: Normal rate, regular rhythm, normal heart sounds and intact distal pulses. Exam reveals no gallop and no friction rub.  No murmur heard. Pulmonary/Chest: Effort normal and breath sounds normal. No respiratory distress. She has no wheezes. She has no rales.  Abdominal: Soft. Bowel sounds are normal. She exhibits no distension and no mass. There is no hepatosplenomegaly. There is abdominal tenderness (increased) in the right lower quadrant. There is no rebound and no guarding.  Musculoskeletal: Normal range of motion.        General: No tenderness or edema.  Lymphadenopathy:    She has no cervical adenopathy.    She has no axillary adenopathy.       Right: No supraclavicular adenopathy present.       Left: No supraclavicular adenopathy present.  Neurological: She is alert and oriented to person, place, and time. Gait normal.  Skin: Skin is warm and dry. No rash noted. She is not diaphoretic. No erythema. No pallor.  Psychiatric: Mood, affect and judgment normal.  Nursing note and vitals reviewed.   Imaging studies: 06/2017:  PET scan revealed enlarging pulmonary nodules but no evidence of abdominal disease per patient report. 09/13/2017:  Abdomen and  pelvic CT revealed a small infarct in the inferior pole of the LEFT kidney and RIGHT uterine infarct. 11/08/2017:  PET scan at the [facility] revealed innumerable stable bilateral cavitary pulmonary nodules (up to 8 mm), some of which demonstrate low level metabolic activity.  There were post treatment changes in the pelvis without evidence of suspicious hypermetabolic activity. 12/03/2017:  Abdomen and pelvic CT revealed continued multiple pulmonary nodules in the lung bases concerning for metastatic disease.  There was mild wall thickening of rectosigmoid colon is noted.  There was a stable 1.8 x 1.6 cm mixed fat and soft tissue density noted in right lower quadrant which may represent fat necrosis.  There was a larger low density is noted involving uterine fundus.  12/03/2017:  Pelvic MRI was unremarkable.   04/15/2018:  Chest, abdomen, and pelvis CT revealed innumerable (> 100) cavitary nodules scattered in the lungs, moderately enlarging compared to the 11/08/2017 PET-CT, suspicious for metastatic disease.  One index node in the RLL measures 1.0 x 1.1 cm (previously 0.6 x 0.6 cm).  There were no new nodules.  There was an ill-defined wall thickening in the rectosigmoid with surrounding stranding along fascia planes, probably sequela from prior radiation therapy.  There was multilevel lumbar impingement due to spondylosis and degenerative disc disease.  There was heterogeneous enhancement in the uterus (some possibly from prior radiation therapy). 04/23/2018:  PET scan revealed numerous scattered solid and cavitary nodules in the lungs stable increased in size compared to the prior PET-CT from 11/08/2017. Largest nodule was 1.1 cm in the LUL (SUV 1.9).  These demonstrated low-grade metabolic activity up to a maximum SUV of about 2.3, increased from 11/08/2017.  05/23/2018:  Abdomen and pelvic CT revealed colonic wall thickening, consistent with colitis. Considerations include  inflammatory or infectious causes. Ischemic causes would be less likely. She is s/p RIGHT hemicolectomy. She had a patent ileocolic  anastomosis.  She received a 10 day course of ciprofloxacin and Flagyl.   09/07/2018:  Abdomen and pelvic CT angiogram revealed colitis, mainly affecting lower loops, question radiation colitis if there has been radiotherapy of the patient's cervical cancer.  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  09/30/2018:  Chest CT revealed interval decrease in size and number of metastatic pulmonary nodules.  There were no new/progressive findings in the chest or upper abdomen   Appointment on 11/19/2018  Component Date Value Ref Range Status  . Magnesium 11/19/2018 1.5* 1.7 - 2.4 mg/dL Final   Performed at [facility], [address] [place], [place] [address]  . Sodium 11/19/2018 137  135 - 145 mmol/L Final  . Potassium 11/19/2018 3.8  3.5 - 5.1 mmol/L Final  . Chloride 11/19/2018 103  98 - 111 mmol/L Final  . CO2 11/19/2018 28  22 - 32 mmol/L Final  . Glucose, Bld 11/19/2018 107* 70 - 99 mg/dL Final  . BUN 11/19/2018 14  6 - 20 mg/dL Final  . Creatinine, Ser 11/19/2018 0.88  0.44 - 1.00 mg/dL Final  . Calcium 11/19/2018 9.1  8.9 - 10.3 mg/dL Final  . GFR calc non Af Amer 11/19/2018 >60  >60 mL/min Final  . GFR calc Af Amer 11/19/2018 >60  >60 mL/min Final  . Anion gap 11/19/2018 6  5 - 15 Final   Performed at [facility], [address] [place], [place] [address]  . WBC 11/19/2018 2.6* 4.0 - 10.5 K/uL Final  . RBC 11/19/2018 3.42* 3.87 - 5.11 MIL/uL Final  . Hemoglobin 11/19/2018 11.8* 12.0 - 15.0 g/dL Final  . HCT 11/19/2018 36.8  36.0 - 46.0 % Final  . MCV 11/19/2018 107.6* 80.0 - 100.0 fL Final  . MCH 11/19/2018 34.5* 26.0 - 34.0 pg Final  . MCHC 11/19/2018 32.1  30.0 - 36.0 g/dL Final  . RDW 11/19/2018 14.6  11.5 - 15.5 % Final  . Platelets 11/19/2018 142* 150 - 400 K/uL Final  . nRBC 11/19/2018 0.0  0.0 - 0.2 % Final  . Neutrophils Relative %  11/19/2018 67  % Final  . Neutro Abs 11/19/2018 1.8  1.7 - 7.7 K/uL Final  . Lymphocytes Relative 11/19/2018 26  % Final  . Lymphs Abs 11/19/2018 0.7  0.7 - 4.0 K/uL Final  . Monocytes Relative 11/19/2018 6  % Final  . Monocytes Absolute 11/19/2018 0.2  0.1 - 1.0 K/uL Final  . Eosinophils Relative 11/19/2018 1  % Final  . Eosinophils Absolute 11/19/2018 0.0  0.0 - 0.5 K/uL Final  . Basophils Relative 11/19/2018 0  % Final  . Basophils Absolute 11/19/2018 0.0  0.0 - 0.1 K/uL Final  . Immature Granulocytes 11/19/2018 0  % Final  . Abs Immature Granulocytes 11/19/2018 0.00  0.00 - 0.07 K/uL Final   Performed at [facility], [address] [place], [place] [address]    Assessment:  MARSIA CINO is a 53 y.o. female with a history of stage IB1 cervical cancer s/p concurrent cisplatin and radiation (external beam and brachytherapy) from 11/2016 - 02/2017.    She was treated by Dr. Christene Slates at [facility] in [place], [place].  In 01/2017, she received cisplatin x 2 and carboplatin x 1 (01/29/2017) due to ARF and XRT. Radiation was followed by T & O on 02/01/2017 and T & N on 02/10/2017 and 02/20/2017.  Course was complicated by weight loss (80 pounds), nausea, vomiting, electrolyte wasting (potassium and magnesium),  and multiple hospitalizations.  PET scan in 06/2017 revealed enlarging pulmonary nodules but no evidence of abdominal disease per patient report.  She is s/p right ileocolectomy on 08/31/2017 for a small bowel obstruction.  She presented with abdominal pain.  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 PDL-1 testing reveals a CPS score of 15.  Chest, abdomen, and pelvis CT on 04/15/2018 revealed innumerable (> 100) cavitary nodules scattered in the lungs, moderately enlarging compared to the 11/08/2017  PET-CT, suspicious for metastatic disease. One index node in the RLL measures 1.0 x 1.1 cm.  There were no new nodules.  There was an ill-defined wall thickening in the rectosigmoid with surrounding stranding along fascia planes, probably sequela from prior radiation therapy.  There was multilevel lumbar impingement due to spondylosis and degenerative disc disease.  There was heterogeneous enhancement in the uterus (some possibly from prior radiation therapy).  PET scan on 04/23/2018 revealed numerous scattered solid and cavitary nodules in the lungs stable increased in size compared to the prior PET-CT from 11/08/2017. Largest nodule was 1.1 cm in the LUL (SUV 1.9).  These demonstrated low-grade metabolic activity up to a maximum SUV of about 2.3, increased from 11/08/2017.   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  She is day 4 s/p cycle # 5 cycle of carboplatin and Taxol (06/04/2018 - 09/30/2018) with Margarette [place] support.    Chest CT on 09/30/2018 revealed interval decrease in size and number of metastatic pulmonary nodules.  There were no new/progressive findings in the chest or upper abdomen  Hypercoagulable work-up on 09/14/2017 revealed heterozygosity for Factor V Leiden (single [id number] mutation).  Testing was negative for prothrombin gene mutation, lupus anticoagulant panel, anticardiolipin antibodies, and beta-2 glycoprotein antibodies.  She was discharged on Xarelto.  She was admitted to [facility] from 12/03/2017 - 12/08/2017 with abdominal pain. She was not felt to have a uterine infarct.  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  The etiology was unclear.  She was admitted to [facility] from 10/08/2018 - 10/11/2018 for rectal bleeding. Hemoglobin dropped from 13 to 8.9 in 4 days. Platelets had decreased to 81,000; chronic anticoagulation (Xarelto) was held. She received 2 units of  PRBCs. Repeat stool studies were negative for diarrhea of infectious etiology. Colonoscopy on 10/10/2018 revealed 3 ulcers in the terminal ileum (no evidence of dysplasia or malignancy). Throughout the course of her admission, platelet count was monitored (dropped as low as 61,000).   She was admitted to [facility] from 10/17/2018 - 10/18/2018 at [facility] abdominal pain, nausea and vomiting.  Abdomen and pelvis CT suggested a partial small bowel obstruction.  She was seen by general surgery and had no evidence of bowel obstruction clinically as she had a bowel movement was passing flatus and had a benign abdomen.  She is hepatitis C positive.   Hepatitis C genotype is 2a/2c.  She has B12 deficiency.  B12 was 165 on 11/27/2017.  She began B12 injections on 11/29/2017.  B12 injections occur monthly at home.  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  She has right knee pain and wishes to have surgery when feasible.  She was diagnosed with colitis.  Abdomen and pelvic CT on 05/23/2018 revealed colonic wall thickening, consistent with colitis. Considerations include inflammatory or infectious causes. Ischemic causes would be less  likely. She is s/p RIGHT hemicolectomy. She had a patent ileocolic anastomosis.  She received a 10 day course of ciprofloxacin and Flagyl.  Abdomen and pelvic CT angiogram on 09/07/2018 revealed colitis, mainly affecting lower loops, question radiation colitis if there has been radiotherapy of the patient's cervical cancer.  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   She has  chronic diarrhea s/p right hemicolectomy.  She began sandostatin 20 mg IM q month on 10/22/2018.  She began Levbid on 10/23/2018.   EGDon10/15/2019revealed an erythematous mucosa in the cardia, bilious gastric fluid, and LA grade A reflux esophagitis.  She has adrenal insufficiency and is on hydrocortisone.  She underwent full dental extraction on 11/05/2018.  Symptomatically, she has ongoing diarrhea.   She has increased RLQ pain with cramping.  She has had significant hematochezia today.  She is weak and lightheaded.  Exam reveals tenderness in the RLQ without guarding or rebound tenderness.  Plan: 1. Labs today: CBC with diff, BMP, Mg 2. Metastatic cervical cancer Day 44 s/p last cycle of carboplatin + paclitaxel with Neulasta support. Concern again addressed regarding reinitiation of chemotherapy with ongoing GI issues. 3. Pancytopenia Hematocrit 36.0 and hemoglobin 11.8. Platelets 142,000.  WBC 2600 (ANC 1800). Rivaroxaban on hold since 11/17/2018.  Continue to hold Xarelto secondary to rectal bleeding. 4. Vaginal bleeding  No further bleeding.  Gyn-Onc noted vaginal atrophy with bleeding on contact on 10/23/2018. 5. Chronic diarrhea and rectal bleeding Last colonoscopy on 10/10/2018 revealed 3 ulcers noted at the terminal ileum.  Pathology was negative for high grade dysplasia or malignancy.  Concern continues for possible ulcerative colitis or Crohn's disease. Patient notes return of significant GI bleeding today. Patient has new RLQ pain. Contact GI (Dr Marius Ditch)- done. Check orthostatics.  IVF and recheck hematocrit/hemoglobin.  Suspect hemoglobin falsely elevated. Check stool for C diff. Patient has appointment with [facility] in 06/2019 (long wait time).  [facility] will be contacted. Discuss possible admission to [facility] secondary to bleeding. 6. Electrolyte derangements Weight loss of 7 pounds.  Ongoing diarrhea and fluid losses. Check orthostatics. IVF 1 liter NS + 3 gm magnesium today. BUN 14 and creatinine 0.88.  Potassium 3.8.  Magnesium  1.5. 7.  IVF followed by recheck of orthostatics. 8.  Recheck CBC after IVF. 9.  Discuss possible admission to [facility] secondary to rectal bleeding and increased RLQ pain.  Addendum:  CBC after 1 liter IVF reveals a hematocrit of 30.6, hemoglobin 10.0, platelets 121,000, WBC 3300.  INR is pending.   Lequita Asal, MD  11/19/2018, 10:08 AM

## 2018-11-19 NOTE — Consult Note (Signed)
[facility]  Date of admission:  11/19/2018  Inpatient day:  11/19/2018  Consulting physician:  Dr. Nicholes Mango.  Reason for Consultation:  Cervical cancer.  Chief Complaint: Joanna Hall is a 53 y.o. female with metastatic cervical cancer who is admitted from the medical oncology clinic with rectal bleeding.  HPI: The patient was diagnosed with metastatic cervical cancer in 04/2018.  She has received 5 cycles of carboplatin and Taxol to date.  Her course has been complicated by chronic diarrhea and rectal bleeding.    Abdomen and pelvic CT on 05/23/2018 revealed colonic wall thickening, consistent with colitis (etiology felt secondary to inflammatory or infectious causes; ischemic causes less likely). She is s/p RIGHT hemicolectomy. She received a 10 day course of ciprofloxacin and Flagyl.  Abdomen and pelvic CT angiogram on 09/07/2018 revealed colitis, mainly affecting lower loops   EGDon10/15/2019revealed an erythematous mucosa in the cardia, bilious gastric fluid, and LA grade A reflux esophagitis  She was admitted to [facility] from 10/08/2018 - 10/11/2018 for rectal bleeding. Hemoglobin dropped from 13 to 8.9 in 4 days.  Chronic anticoagulation (Xarelto) was held. She received 2 units of PRBCs. Stool studies were negative for diarrhea of infectious etiology. Colonoscopy on 10/10/2018 revealed 3 ulcers in the terminal ileum (no evidence of dysplasia or malignancy).   She was admitted to [facility] from 10/17/2018 - 10/18/2018 at [facility] abdominal pain, nausea and vomiting.  Abdomen and pelvis CT suggested a partial small bowel obstruction.   She was seen in the medical oncology clinic today.  Weight was down 7 pounds in 1 week.  She noted ongoing intermittent diarrhea.  She had increased pain in the RLQ.  She described a little blood when she wiped yesterday.  She showed a photo from her phone taken with a significant amount of blood in the toilet today.  She described feeling  "really crampy since Sunday".  She felt weak and lightheaded.    Her last dose of Xarelto was on 11/17/2018.  She denied any vaginal bleeding.  She took an ibuprofen a few days ago.    Initial CBC revealed a hematocrit of 36.8 with a hemoglobin of 11.8.  After 1 liter of IVF, hematocrit of 30.6 and hemoglobin 10.0.   Past Medical History:  Diagnosis Date  . Abdominal pain 06/10/2018  . Abnormal cervical Papanicolaou smear 09/18/2017  . Anxiety   . Aortic atherosclerosis ([place])   . Arthritis    neck and knees  . 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 Blood transfusion without reported diagnosis   . Cervical cancer ([place]) 09/2016   mets lung  . Chronic anal fissure   . Chronic diarrhea   . Dyspnea   . Erosive gastropathy 09/18/2017  . Factor V Leiden mutation ([place])   . Fecal incontinence   . Genital warts   . GERD (gastroesophageal reflux disease)   . Heart murmur   . Hematochezia   . Hemorrhoids   . 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 Hepatitis, chronic ([place]) 05/05/2017  . History of cancer chemotherapy    completed 06/2017  . History of Clostridium difficile infection    while undergoing chemo.  Negative test 10/2017  . Ileocolic anastomotic leak   . 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 Intestinal infection due to Clostridium difficile 09/18/2017  . Macrocytic anemia with vitamin B12 deficiency   .  Multiple gastric ulcers   . Nausea vomiting and diarrhea   . Pancolitis ([place]) 07/27/2018  . Perianal condylomata   . Pneumonia    History of  . Pulmonary nodules   . Rectal bleeding   . Small bowel obstruction ([place]) 08/2017  . Stiff neck    limited right turn  . Vitamin D deficiency     Past Surgical History:  Procedure Laterality Date  . CHOLECYSTECTOMY    . COLON SURGERY  08/2017   resection  . COLONOSCOPY WITH PROPOFOL N/A 12/20/2017   Procedure: COLONOSCOPY WITH PROPOFOL;  Surgeon: [name],  MD;  Location: [facility] ENDOSCOPY;  Service: Gastroenterology;  Laterality: N/A;  . COLONOSCOPY WITH PROPOFOL N/A 07/30/2018   Procedure: COLONOSCOPY WITH PROPOFOL;  Surgeon: [name], MD;  Location: [facility] ENDOSCOPY;  Service: Gastroenterology;  Laterality: N/A;  . COLONOSCOPY WITH PROPOFOL N/A 10/10/2018   Procedure: COLONOSCOPY WITH PROPOFOL;  Surgeon: Lucilla Lame, MD;  Location: [facility] ENDOSCOPY;  Service: Endoscopy;  Laterality: N/A;  . DIAGNOSTIC LAPAROSCOPY    . ESOPHAGOGASTRODUODENOSCOPY (EGD) WITH PROPOFOL N/A 12/20/2017   Procedure: ESOPHAGOGASTRODUODENOSCOPY (EGD) WITH PROPOFOL;  Surgeon: [name], MD;  Location: [place];  Service: Gastroenterology;  Laterality: N/A;  . ESOPHAGOGASTRODUODENOSCOPY (EGD) WITH PROPOFOL  07/30/2018   Procedure: ESOPHAGOGASTRODUODENOSCOPY (EGD) WITH PROPOFOL;  Surgeon: [name], MD;  Location: [facility] ENDOSCOPY;  Service: Gastroenterology;;  . LAPAROTOMY N/A 08/31/2017   Procedure: EXPLORATORY LAPAROTOMY for SBO, ileocolectomy, removal of piece of uterine wall;  Surgeon: [name], MD;  Location: [facility] ORS;  Service: General;  Laterality: N/A;  . LASER ABLATION CONDOLAMATA N/A 02/22/2018   Procedure: LASER ABLATION/REMOVAL OF [id number] AROUND ANUS AND VAGINA;  Surgeon: Michael Boston, MD;  Location: [place];  Service: General;  Laterality: N/A;  . OOPHORECTOMY    . PORTA CATH INSERTION N/A 05/13/2018   Procedure: PORTA CATH INSERTION;  Surgeon: Katha Cabal, MD;  Location: [place] CV LAB;  Service: Cardiovascular;  Laterality: N/A;  . SMALL INTESTINE SURGERY    . TANDEM RING INSERTION     x3  . THORACOTOMY    . TOTAL KNEE ARTHROPLASTY Left 04/24/2018   Procedure: TOTAL KNEE ARTHROPLASTY;  Surgeon: Lovell Sheehan, MD;  Location: [facility] ORS;  Service: Orthopedics;  Laterality: Left;    Family History  Problem Relation Age of Onset  . Hypertension Father   . Diabetes Father   . Alcohol abuse  Daughter   . Hypertension Maternal Grandmother   . Diabetes Maternal Grandmother   . Diabetes Paternal Grandmother   . Hypertension Paternal Grandmother     Social History:  reports that she quit smoking about 12 years ago. She has never used smokeless tobacco. She reports previous alcohol use. She reports current drug use. Drug: Marijuana.  The patient is alone today.  Allergies:  Allergies  Allergen Reactions  . Ketamine Anxiety and Other (See Comments)    Syncope episode/confusion     Medications Prior to Admission  Medication Sig Dispense Refill  . amitriptyline (ELAVIL) 75 MG tablet Take 1 tablet (75 mg total) by mouth at bedtime. 90 tablet 1  . budesonide (ENTOCORT EC) 3 MG 24 hr capsule Take 3 capsules (9 mg total) by mouth daily. 30 capsule 0  . Calcium Carb-Cholecalciferol (CALCIUM 500 +D) 500-400 MG-UNIT TABS Take 2 tablets by mouth daily.    . diphenoxylate-atropine (LOMOTIL) 2.5-0.025 MG tablet Take 2 tablets by mouth 4 (four) times daily. 30 tablet 0  . hydrocortisone (  CORTEF) 10 MG tablet Take 1 tablet (10 mg total) by mouth 2 (two) times daily. 180 tablet 0  . hyoscyamine (LEVBID) 0.375 MG 12 hr tablet Take 1 tablet (0.375 mg total) by mouth 2 (two) times daily. 60 tablet 0  . Multiple Vitamins-Minerals (MULTIVITAMIN WITH MINERALS) tablet Take 1 tablet by mouth daily. 30 tablet 0  . octreotide (SANDOSTATIN LAR DEPOT) 20 MG injection Inject 20 mg into the muscle every 28 (twenty-eight) days. 1 each 0  . ondansetron (ZOFRAN) 8 MG tablet Take 1 tablet (8 mg total) by mouth every 8 (eight) hours as needed for nausea or vomiting. (Patient not taking: Reported on 11/19/2018) 30 tablet 1  . oxyCODONE (OXY IR/ROXICODONE) 5 MG immediate release tablet Take 1 tablet (5 mg total) by mouth every 6 (six) hours as needed for moderate pain or severe pain. 30 tablet 0  . pantoprazole (PROTONIX) 40 MG tablet Take 1 tablet (40 mg total) by mouth daily. 90 tablet 0  . potassium chloride SA  (K-DUR,KLOR-CON) 20 MEQ tablet Take 1 tablet (20 mEq total) by mouth daily. for 4 days. (Patient not taking: Reported on 11/19/2018) 30 tablet 0  . promethazine (PHENERGAN) 25 MG tablet Take 1 tablet (25 mg total) by mouth every 6 (six) hours as needed for nausea or vomiting. (Patient not taking: Reported on 11/19/2018) 30 tablet 0  . VENTOLIN HFA 108 (90 Base) MCG/ACT inhaler Inhale 1-2 puffs into the lungs every 4 (four) hours as needed for shortness of breath.   0  . VIBERZI 100 MG TABS TAKE 1 TABLET BY MOUTH TWICE DAILY WITH  A  MEAL 60 tablet 0    Review of Systems: GENERAL:  Fatigue.  No fevers, sweats.  Weight loss of 7 pounds in 1 week. PERFORMANCE STATUS (ECOG):  1-2 HEENT:  s/p full dental extraction.  No visual changes, runny nose, sore throat, mouth sores or tenderness. Lungs: Shortness of breath on exertion.  No cough.  No hemoptysis. Cardiac:  No chest pain, palpitations, orthopnea, or PND. GI:  Chronic lower abdominal discomfort with increased RLQ pain.  Diarrhea.  Hematochezia today.  No nausea, vomiting, constipation, or melena. GU:  No urgency, frequency, dysuria, or hematuria.  No vaginal bleeding. Musculoskeletal:  No back pain.  No joint pain.  No muscle tenderness. Extremities:  No pain or swelling. Skin:  No rashes or skin changes. Neuro:  General weakness. Dizziness.  No headache, numbness or weakness, balance or coordination issues. Endocrine:  No diabetes, thyroid issues, hot flashes or night sweats. Psych:  No mood changes, depression or anxiety. Pain:  No focal pain. Review of systems:  All other systems reviewed and found to be negative.  Physical Exam:  Blood pressure 119/79, pulse 77, temperature 97.9 F (36.6 C), temperature source Oral, resp. rate 18, SpO2 100 %.  GENERAL: Chronically fatigued appearing woman sitting comfortably on the medical unit in no acute distress. MENTAL STATUS:  Alert and oriented to person, place and time. HEAD:  Wearing a wrap.   Alopecia.  Normocephalic, atraumatic, face symmetric, no Cushingoid features. EYES:  Pupils equal round and reactive to light and accomodation.  No conjunctivitis or scleral icterus. ENT:  Oropharynx clear without lesion.  Tongue normal. Mucous membranes moist.  RESPIRATORY:  Clear to auscultation without rales, wheezes or rhonchi. CARDIOVASCULAR:  Regular rate and rhythm without murmur, rub or gallop. ABDOMEN:  Lower abdominal discomfort on palpation, increased in RLQ.  No guarding or rebound tenderness.  Soft, with active bowel sounds, and  no hepatosplenomegaly.  No masses. SKIN:  No rashes, ulcers or lesions. EXTREMITIES: No edema, no skin discoloration or tenderness.  No palpable cords. LYMPH NODES: No palpable cervical, supraclavicular, axillary or inguinal adenopathy  NEUROLOGICAL: Unremarkable. PSYCH:  Appropriate.   Results for orders placed or performed in visit on 11/19/18 (from the past 48 hour(s))  CBC     Status: Abnormal   Collection Time: 11/19/18  1:36 PM  Result Value Ref Range   WBC 3.3 (L) 4.0 - 10.5 K/uL   RBC 2.87 (L) 3.87 - 5.11 MIL/uL   Hemoglobin 10.0 (L) 12.0 - 15.0 g/dL   HCT 30.6 (L) 36.0 - 46.0 %   MCV 106.6 (H) 80.0 - 100.0 fL   MCH 34.8 (H) 26.0 - 34.0 pg   MCHC 32.7 30.0 - 36.0 g/dL   RDW 14.5 11.5 - 15.5 %   Platelets 121 (L) 150 - 400 K/uL   nRBC 0.0 0.0 - 0.2 %    Comment: Performed at [facility], [address] [place], [place] [address]   No results found.  Assessment:  The patient is a 53 y.o.  woman with metastatic cervical cancer admitted with RLQ pain and hematochezia.  Abdomen and pelvic CT on 05/23/2018 revealed colonic wall thickening, consistent with colitis.  She received a 10 day course of ciprofloxacin and Flagyl.  Abdomen and pelvic CT angiogram on 09/07/2018 revealed colitis, mainly affecting lower loops   EGDon10/15/2019revealed an erythematous mucosa in the cardia, bilious gastric fluid, and LA grade A reflux  esophagitis  Colonoscopy on 10/10/2018 revealed 3 ulcers in the terminal ileum (no evidence of dysplasia or malignancy).   Symptomatically, she has chronic frequent diarrhea of unknown etiology.  She has increased RLQ pain and new hematochezia today.  Hemoglobin decreased from 11.8 to 10.0 with hydration today.  Plan:   1.  Oncology  Treatment for metastatic cervical cancer currently on hold.  Chest CT on 10/10/2018 revealed decrease in size and number of pulmonary nodules. 2.  Hematology  Patient is heterozygote for Factor V Leiden.   She has a history of uterine infarct.    Xarelto on hold (last dose 11/17/2018).   Maintain active type and screen.  Follow serial CBCs.  ICDs for DVT prophylaxis. 3.  Gastroenterology  Patient has chronic diarrhea of unclear etiology.  Check stool for C diff.  Patient has a history of colitis and ulcers in the terminal ileum.  GI consult.   Thank you for allowing me to participate in Joanna Hall 's care.  I will follow her closely with you while hospitalized and after discharge in the outpatient department.   Lequita Asal, MD  11/19/2018, 8:09 PM

## 2018-11-19 NOTE — H&P (Signed)
[facility] [place] NAME: Joanna Hall    MR#:  [id number]  DATE OF BIRTH:  [birth year]  DATE OF ADMISSION:  11/19/2018  PRIMARY CARE PHYSICIAN: Arnetha Courser, MD   REQUESTING/REFERRING PHYSICIAN: Dr. Mike Gip  CHIEF COMPLAINT:  GI bleed  HISTORY OF PRESENT ILLNESS:  Joanna Hall  is a 53 y.o. female with a known history of GERD, gastric ulcers, stage IV cervical cancer last chemotherapy on 09/30/2018, history of intermittent episodes of diarrhea which has been progressively getting worse and has been experiencing watery diarrhea 10 times a day, was on Xarelto which was discontinued on Sunday he is seen at Dr. Kem Parkinson office today.  Patient was complaining of dizziness and GI bleed, hemoglobin was at 10.0 and hospitalist team is called and patient was sent to the hospital as a direct admission.  Patient is reporting right lower quadrant abdominal pain radiating to the left side of the lower abdomen associated with dizziness denies any nausea vomiting.  PAST MEDICAL HISTORY:   Past Medical History:  Diagnosis Date  . Abdominal pain 06/10/2018  . Abnormal cervical Papanicolaou smear 09/18/2017  . Anxiety   . Aortic atherosclerosis ([place])   . Arthritis    neck and knees  . 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 Blood transfusion without reported diagnosis   . Cervical cancer ([place]) 09/2016   mets lung  . Chronic anal fissure   . Chronic diarrhea   . Dyspnea   . Erosive gastropathy 09/18/2017  . Factor V Leiden mutation ([place])   . Fecal incontinence   . Genital warts   . GERD (gastroesophageal reflux disease)   . Heart murmur   . Hematochezia   . Hemorrhoids   . 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 Hepatitis, chronic ([place]) 05/05/2017  . History of cancer chemotherapy    completed 06/2017  . History of Clostridium difficile infection    while undergoing chemo.  Negative  test 10/2017  . Ileocolic anastomotic leak   . 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 Intestinal infection due to Clostridium difficile 09/18/2017  . Macrocytic anemia with vitamin B12 deficiency   . Multiple gastric ulcers   . Nausea vomiting and diarrhea   . Pancolitis ([place]) 07/27/2018  . Perianal condylomata   . Pneumonia    History of  . Pulmonary nodules   . Rectal bleeding   . Small bowel obstruction ([place]) 08/2017  . Stiff neck    limited right turn  . Vitamin D deficiency     PAST SURGICAL HISTOIRY:   Past Surgical History:  Procedure Laterality Date  . CHOLECYSTECTOMY    . COLON SURGERY  08/2017   resection  . COLONOSCOPY WITH PROPOFOL N/A 12/20/2017   Procedure: COLONOSCOPY WITH PROPOFOL;  Surgeon: [name], MD;  Location: [facility] ENDOSCOPY;  Service: Gastroenterology;  Laterality: N/A;  . COLONOSCOPY WITH PROPOFOL N/A 07/30/2018   Procedure: COLONOSCOPY WITH PROPOFOL;  Surgeon: [name], MD;  Location: [facility] ENDOSCOPY;  Service: Gastroenterology;  Laterality: N/A;  . COLONOSCOPY WITH PROPOFOL N/A 10/10/2018   Procedure: COLONOSCOPY WITH PROPOFOL;  Surgeon: Lucilla Lame, MD;  Location[facility] ENDOSCOPY;  Service: Endoscopy;  Laterality: N/A;  . DIAGNOSTIC LAPAROSCOPY    . ESOPHAGOGASTRODUODENOSCOPY (EGD) WITH PROPOFOL N/A 12/20/2017   Procedure: ESOPHAGOGASTRODUODENOSCOPY (EGD) WITH PROPOFOL;  Surgeon: [name],  MD;  Location: [facility] ENDOSCOPY;  Service: Gastroenterology;  Laterality: N/A;  . ESOPHAGOGASTRODUODENOSCOPY (EGD) WITH PROPOFOL  07/30/2018   Procedure: ESOPHAGOGASTRODUODENOSCOPY (EGD) WITH PROPOFOL;  Surgeon: [name], MD;  Location: [facility] ENDOSCOPY;  Service: Gastroenterology;;  . LAPAROTOMY N/A 08/31/2017   Procedure: EXPLORATORY LAPAROTOMY for SBO, ileocolectomy, removal of piece of uterine wall;  Surgeon: [name], MD;  Location: [facility] ORS;  Service: General;  Laterality: N/A;  . LASER ABLATION  CONDOLAMATA N/A 02/22/2018   Procedure: LASER ABLATION/REMOVAL OF DXAJOINOMVE AROUND ANUS AND VAGINA;  Surgeon: Michael Boston, MD;  Location: [facility];  Service: General;  Laterality: N/A;  . OOPHORECTOMY    . PORTA CATH INSERTION N/A 05/13/2018   Procedure: PORTA CATH INSERTION;  Surgeon: Katha Cabal, MD;  Location: [facility] CV LAB;  Service: Cardiovascular;  Laterality: N/A;  . SMALL INTESTINE SURGERY    . TANDEM RING INSERTION     x3  . THORACOTOMY    . TOTAL KNEE ARTHROPLASTY Left 04/24/2018   Procedure: TOTAL KNEE ARTHROPLASTY;  Surgeon: Lovell Sheehan, MD;  Location: [facility] ORS;  Service: Orthopedics;  Laterality: Left;    SOCIAL HISTORY:   Social History   Tobacco Use  . Smoking status: Former Smoker    Last attempt to quit: 10/16/2006    Years since quitting: 12.1  . Smokeless tobacco: Never Used  Substance Use Topics  . Alcohol use: Not Currently    Frequency: Never    Comment: seldom    FAMILY HISTORY:   Family History  Problem Relation Age of Onset  . Hypertension Father   . Diabetes Father   . Alcohol abuse Daughter   . Hypertension Maternal Grandmother   . Diabetes Maternal Grandmother   . Diabetes Paternal Grandmother   . Hypertension Paternal Grandmother     DRUG ALLERGIES:   Allergies  Allergen Reactions  . Ketamine Anxiety and Other (See Comments)    Syncope episode/confusion     REVIEW OF SYSTEMS:  CONSTITUTIONAL: No fever, fatigue or weakness.  EYES: No blurred or double vision.  EARS, NOSE, AND THROAT: No tinnitus or ear pain.  RESPIRATORY: No cough, shortness of breath, wheezing or hemoptysis.  CARDIOVASCULAR: No chest pain, orthopnea, edema.  GASTROINTESTINAL: No nausea, vomiting, reporting worsening of diarrhea, right lower abdominal pain and bleed GENITOURINARY: No dysuria, hematuria.  ENDOCRINE: No polyuria, nocturia,  HEMATOLOGY: No anemia, easy bruising or bleeding SKIN: No rash or lesion. MUSCULOSKELETAL:  No joint pain or arthritis.   NEUROLOGIC: No tingling, numbness, weakness.  PSYCHIATRY: No anxiety or depression.   MEDICATIONS AT HOME:   Prior to Admission medications   Medication Sig Start Date End Date Taking? Authorizing Provider  amitriptyline (ELAVIL) 75 MG tablet Take 1 tablet (75 mg total) by mouth at bedtime. 11/11/18 02/09/19  [name], MD  budesonide (ENTOCORT EC) 3 MG 24 hr capsule Take 3 capsules (9 mg total) by mouth daily. 10/12/18   Nicholes Mango, MD  Calcium Carb-Cholecalciferol (CALCIUM 500 +D) 500-400 MG-UNIT TABS Take 2 tablets by mouth daily.    [provider]  diphenoxylate-atropine (LOMOTIL) 2.5-0.025 MG tablet Take 2 tablets by mouth 4 (four) times daily. 10/11/18   Nicholes Mango, MD  hydrocortisone (CORTEF) 10 MG tablet Take 1 tablet (10 mg total) by mouth 2 (two) times daily. 11/11/18 02/09/19  [name], MD  hyoscyamine (LEVBID) 0.375 MG 12 hr tablet Take 1 tablet (0.375 mg total) by mouth 2 (two) times daily. 10/21/18   Vanga,  Tally Due, MD  Multiple Vitamins-Minerals (MULTIVITAMIN WITH MINERALS) tablet Take 1 tablet by mouth daily. 08/01/18 08/01/19  Loletha Grayer, MD  octreotide (SANDOSTATIN LAR DEPOT) 20 MG injection Inject 20 mg into the muscle every 28 (twenty-eight) days. 10/23/18   [name], MD  ondansetron (ZOFRAN) 8 MG tablet Take 1 tablet (8 mg total) by mouth every 8 (eight) hours as needed for nausea or vomiting. Patient not taking: Reported on 11/19/2018 11/07/18   Karen Kitchens, NP  oxyCODONE (OXY IR/ROXICODONE) 5 MG immediate release tablet Take 1 tablet (5 mg total) by mouth every 6 (six) hours as needed for moderate pain or severe pain. 11/10/18   Karen Kitchens, NP  pantoprazole (PROTONIX) 40 MG tablet Take 1 tablet (40 mg total) by mouth daily. 10/30/18   Karen Kitchens, NP  potassium chloride SA (K-DUR,KLOR-CON) 20 MEQ tablet Take 1 tablet (20 mEq total) by mouth daily. for 4 days. Patient not taking: Reported on  11/19/2018 11/06/18   Lequita Asal, MD  promethazine (PHENERGAN) 25 MG tablet Take 1 tablet (25 mg total) by mouth every 6 (six) hours as needed for nausea or vomiting. Patient not taking: Reported on 11/19/2018 10/17/18   Karen Kitchens, NP  VENTOLIN HFA 108 (90 Base) MCG/ACT inhaler Inhale 1-2 puffs into the lungs every 4 (four) hours as needed for shortness of breath.     [provider]  VIBERZI 100 MG TABS TAKE 1 TABLET BY MOUTH TWICE DAILY WITH  A  MEAL 10/11/18   Verlon Au, NP      VITAL SIGNS:  Blood pressure 136/69, pulse 84, temperature 97.9 F (36.6 C), temperature source Oral, resp. rate 18, SpO2 99 %.  PHYSICAL EXAMINATION:  GENERAL:  53 y.o.-year-old patient lying in the bed with no acute distress.  EYES: Pupils equal, round, reactive to light and accommodation. No scleral icterus. Extraocular muscles intact.  HEENT: Head atraumatic, normocephalic. Oropharynx and nasopharynx clear.  NECK:  Supple, no jugular venous distention. No thyroid enlargement, no tenderness.  LUNGS: Normal breath sounds bilaterally, no wheezing, rales,rhonchi or crepitation. No use of accessory muscles of respiration.  CARDIOVASCULAR: S1, S2 normal. No murmurs, rubs, or gallops.  ABDOMEN: Soft, right lower quadrant is tender, no rebound tenderness,, nondistended. Bowel sounds present.  EXTREMITIES: No pedal edema, cyanosis, or clubbing.  NEUROLOGIC: Awake, alert and oriented x3. Sensation intact. Gait not checked.  PSYCHIATRIC: The patient is alert and oriented x 3.  SKIN: No obvious rash, lesion, or ulcer.   LABORATORY PANEL:   CBC Recent Labs  Lab 11/19/18 1336  WBC 3.3*  HGB 10.0*  HCT 30.6*  PLT 121*   ------------------------------------------------------------------------------------------------------------------  Chemistries  Recent Labs  Lab 11/19/18 0919  NA 137  K 3.8  CL 103  CO2 28  GLUCOSE 107*  BUN 14  CREATININE 0.88  CALCIUM 9.1  MG 1.5*    ------------------------------------------------------------------------------------------------------------------  Cardiac Enzymes No results for input(s): TROPONINI in the last 168 hours. ------------------------------------------------------------------------------------------------------------------  RADIOLOGY:  No results found.  EKG:   Orders placed or performed in visit on 10/21/18  . EKG 12-Lead    IMPRESSION AND PLAN:    Acute lower GI bleed with history of multiple GI ulcers in the past Admit to MedSurg unit Monitor hemoglobin hematocrit transfuse as needed currently hemoglobin at 10 with hematocrit 30.6 GI consult placed, Dr. Marius Ditch notified and she is aware of the consult Provide clear liquid diet and n.p.o. after midnight PPI Xarelto discontinued, last dose  was on 11/17/2018  Stage IV cervical cancer on chemotherapy Consult placed to Dr. Mike Gip, she is also aware of the consult Outpatient follow-up with Dr. Mike Gip after discharge is recommended   #Hypomagnesemia replete and recheck in a.m. Potassium is in the normal range  #Acute on chronic diarrhea check stool for C. difficile toxin and GI panel Enteric precautions in the interim  #History of hepatitis C Has an appointment with gastroenterology at [place] records are reviewed and case discussed with ED provider. Management plans discussed with the patient, family and they are in agreement.  CODE STATUS: fc   TOTAL TIME TAKING CARE OF THIS PATIENT: 43  minutes.   Note: This dictation was prepared with Dragon dictation along with smaller phrase technology. Any transcriptional errors that result from this process are unintentional.  Nicholes Mango M.D on 11/19/2018 at 7:22 PM  Between 7am to 6pm - Pager - [phone]  After 6pm go to [email or web address] - password [email or web address] [facility]  Office  [phone]  CC: Primary care physician; Arnetha Courser, MD

## 2018-11-20 ENCOUNTER — Ambulatory Visit: Payer: Medicaid Other

## 2018-11-20 ENCOUNTER — Encounter
Admission: AD | Disposition: A | Discharge status: Home / Self Care | Payer: Self-pay | Source: Ambulatory Visit | Attending: Internal Medicine

## 2018-11-20 ENCOUNTER — Encounter: Payer: Self-pay | Admitting: Hematology and Oncology

## 2018-11-20 ENCOUNTER — Other Ambulatory Visit: Payer: Medicaid Other

## 2018-11-20 ENCOUNTER — Encounter: Payer: Self-pay | Admitting: Urgent Care

## 2018-11-20 ENCOUNTER — Ambulatory Visit: Admission: RE | Admit: 2018-11-20 | Payer: Medicaid Other | Source: Ambulatory Visit

## 2018-11-20 ENCOUNTER — Other Ambulatory Visit: Payer: Self-pay

## 2018-11-20 DIAGNOSIS — R197 Diarrhea, unspecified: Secondary | ICD-10-CM | POA: Diagnosis not present

## 2018-11-20 DIAGNOSIS — K625 Hemorrhage of anus and rectum: Secondary | ICD-10-CM

## 2018-11-20 LAB — COMPREHENSIVE METABOLIC PANEL
ALT: 44 U/L (ref 0–44)
ANION GAP: 3 — AB (ref 5–15)
AST: 37 U/L (ref 15–41)
Albumin: 3 g/dL — ABNORMAL LOW (ref 3.5–5.0)
Alkaline Phosphatase: 114 U/L (ref 38–126)
BUN: 9 mg/dL (ref 6–20)
CO2: 27 mmol/L (ref 22–32)
Calcium: 8.5 mg/dL — ABNORMAL LOW (ref 8.9–10.3)
Chloride: 106 mmol/L (ref 98–111)
Creatinine, Ser: 0.73 mg/dL (ref 0.44–1.00)
GFR calc Af Amer: >60 mL/min (ref >60)
GFR calc non Af Amer: >60 mL/min (ref >60)
Glucose, Bld: 94 mg/dL (ref 70–99)
Potassium: 4.3 mmol/L (ref 3.5–5.1)
Sodium: 136 mmol/L (ref 135–145)
Total Bilirubin: 0.3 mg/dL (ref 0.3–1.2)
Total Protein: 6.9 g/dL (ref 6.5–8.1)

## 2018-11-20 LAB — CBC
HEMATOCRIT: 30.7 % — AB (ref 36.0–46.0)
Hemoglobin: 9.9 g/dL — ABNORMAL LOW (ref 12.0–15.0)
MCH: 34.9 pg — ABNORMAL HIGH (ref 26.0–34.0)
MCHC: 32.2 g/dL (ref 30.0–36.0)
MCV: 108.1 fL — ABNORMAL HIGH (ref 80.0–100.0)
Platelets: 124 10*3/uL — ABNORMAL LOW (ref 150–400)
RBC: 2.84 MIL/uL — ABNORMAL LOW (ref 3.87–5.11)
RDW: 14.5 % (ref 11.5–15.5)
WBC: 2.4 10*3/uL — ABNORMAL LOW (ref 4.0–10.5)
nRBC: 0 % (ref 0.0–0.2)

## 2018-11-20 LAB — TROPONIN I

## 2018-11-20 LAB — TYPE AND SCREEN
ABO/RH(D): O POS
Antibody Screen: NEGATIVE

## 2018-11-20 LAB — SODIUM: SODIUM: 134 mmol/L — AB (ref 135–145)

## 2018-11-20 LAB — HEMOGLOBIN AND HEMATOCRIT, BLOOD
HCT: 29.8 % — ABNORMAL LOW (ref 36.0–46.0)
Hemoglobin: 9.6 g/dL — ABNORMAL LOW (ref 12.0–15.0)

## 2018-11-20 LAB — C DIFFICILE QUICK SCREEN W PCR REFLEX
C Diff antigen: NEGATIVE
C Diff interpretation: NOT DETECTED
C Diff toxin: NEGATIVE

## 2018-11-20 LAB — MAGNESIUM
Magnesium: 1.7 mg/dL (ref 1.7–2.4)
Magnesium: 1.5 mg/dL — ABNORMAL LOW (ref 1.7–2.4)

## 2018-11-20 LAB — POTASSIUM: Potassium: 3.7 mmol/L (ref 3.5–5.1)

## 2018-11-20 LAB — PHOSPHORUS: Phosphorus: 3.7 mg/dL (ref 2.5–4.6)

## 2018-11-20 SURGERY — IMAGING PROCEDURE, GI TRACT, INTRALUMINAL, VIA CAPSULE

## 2018-11-20 MED ORDER — MORPHINE SULFATE (PF) 2 MG/ML IV SOLN
2.0000 mg | INTRAVENOUS | Status: DC | PRN
  Administered 2018-11-20 – 2018-11-26 (×28): 2 mg via INTRAVENOUS
  Filled 2018-11-20 (×28): qty 1

## 2018-11-20 MED ORDER — AMITRIPTYLINE HCL 25 MG PO TABS
75.0000 mg | ORAL_TABLET | Freq: Every day | ORAL | Status: DC
  Administered 2018-11-20 – 2018-11-25 (×7): 75 mg via ORAL
  Filled 2018-11-20 (×7): qty 3

## 2018-11-20 MED ORDER — PROMETHAZINE HCL 25 MG/ML IJ SOLN
12.5000 mg | Freq: Four times a day (QID) | INTRAMUSCULAR | Status: DC | PRN
  Administered 2018-11-21: 01:00:00 25 mg via INTRAVENOUS
  Administered 2018-11-22: 12.5 mg via INTRAVENOUS
  Administered 2018-11-23 – 2018-11-26 (×5): 25 mg via INTRAVENOUS
  Administered 2018-11-26: 14:00:00 12.5 mg via INTRAVENOUS
  Filled 2018-11-20 (×8): qty 1

## 2018-11-20 MED ORDER — MAGNESIUM CITRATE PO SOLN
1.0000 | Freq: Once | ORAL | Status: AC
  Administered 2018-11-20: 23:00:00 1 via ORAL
  Filled 2018-11-20: qty 296

## 2018-11-20 MED ORDER — ENSURE ENLIVE PO LIQD
237.0000 mL | Freq: Two times a day (BID) | ORAL | Status: DC
  Administered 2018-11-23 – 2018-11-25 (×2): 237 mL via ORAL

## 2018-11-20 MED ORDER — SODIUM CHLORIDE 0.9 % IV SOLN
INTRAVENOUS | Status: DC
  Administered 2018-11-21 (×2): via INTRAVENOUS

## 2018-11-20 NOTE — Progress Notes (Signed)
[place] [facility] [place] NAME: Joanna Hall    MR#:  [id number]  DATE OF BIRTH:  [birth year]  SUBJECTIVE:   Patient states she continues to have lower abdominal pain.  She is having watery bright red stools.  She states that she is also had a lot of weakness over the last couple of days.  REVIEW OF SYSTEMS:  Review of Systems  Constitutional: Positive for malaise/fatigue. Negative for chills and fever.  HENT: Negative for congestion and sore throat.   Eyes: Negative for blurred vision and double vision.  Respiratory: Negative for cough and shortness of breath.   Cardiovascular: Negative for chest pain and palpitations.  Gastrointestinal: Positive for abdominal pain, blood in stool and diarrhea. Negative for melena, nausea and vomiting.  Genitourinary: Negative for dysuria, hematuria and urgency.  Musculoskeletal: Negative for back pain and neck pain.  Neurological: Positive for weakness. Negative for dizziness, focal weakness and headaches.  Psychiatric/Behavioral: Negative for depression. The patient is not nervous/anxious.     DRUG ALLERGIES:   Allergies  Allergen Reactions  . Ketamine Anxiety and Other (See Comments)    Syncope episode/confusion    VITALS:  Blood pressure (!) 147/91, pulse 66, temperature (!) 97.5 F (36.4 C), temperature source Oral, resp. rate 14, height 5' 8"  (1.727 m), weight 80.7 kg, SpO2 97 %. PHYSICAL EXAMINATION:  Physical Exam  GENERAL:  53 y.o.-year-old patient lying in the bed with no acute distress.  Chronically ill-appearing. EYES: Pupils equal, round, reactive to light and accommodation. No scleral icterus. Extraocular muscles intact.  HEENT: Head atraumatic, normocephalic. Oropharynx and nasopharynx clear.  NECK:  Supple, no jugular venous distention. No thyroid enlargement, no tenderness.  LUNGS: Normal breath sounds bilaterally, no wheezing, rales,rhonchi or crepitation. No use of accessory  muscles of respiration.  CARDIOVASCULAR: RRR, S1, S2 normal. No murmurs, rubs, or gallops.  ABDOMEN: Soft, right lower quadrant is tender, no rebound tenderness,, nondistended. Bowel sounds present.  EXTREMITIES: No pedal edema, cyanosis, or clubbing.  NEUROLOGIC: CN 2-12 grossly intact, +global weakness. Sensation intact. Gait not checked.  PSYCHIATRIC: The patient is alert and oriented x 3.  SKIN: No obvious rash, lesion, or ulcer.  LABORATORY PANEL:  Female CBC Recent Labs  Lab 11/20/18 0515  WBC 2.4*  HGB 9.9*  HCT 30.7*  PLT 124*   ------------------------------------------------------------------------------------------------------------------ Chemistries  Recent Labs  Lab 11/20/18 0515  NA 136  K 4.3  CL 106  CO2 27  GLUCOSE 94  BUN 9  CREATININE 0.73  CALCIUM 8.5*  MG 1.7  AST 37  ALT 44  ALKPHOS 114  BILITOT 0.3   RADIOLOGY:  No results found. ASSESSMENT AND PLAN:   Acute lower GI bleed with history of multiple GI ulcers in the past- hemoglobin has been stable -GI consulted- plan for flex sig in the morning -Full liquid diet for now and will make patient n.p.o. at midnight -Continue PPI -Xarelto discontinued, last dose was on 11/17/2018  Stage IV cervical cancer on chemotherapy -Oncology following  Hypomagnesemia- mag has improved from 1.5 to 1.7 -Replete and recheck  Acute on chronic diarrhea- improved -C. Diff negative  Chronic pancytopenia- likely related to chemo -Oncology following  History of hepatitis C -Needs to follow-up with gastroenterology at [place] the records are reviewed and case discussed with Care Management/Social Worker. Management plans discussed with the patient, family and they are in agreement.  CODE STATUS: Full Code  TOTAL TIME TAKING CARE OF THIS PATIENT:  35 minutes.   More than 50% of the time was spent in counseling/coordination of care: YES  POSSIBLE D/C IN 1-2 DAYS, DEPENDING ON CLINICAL  CONDITION.   Berna Spare Mayo M.D on 11/20/2018 at 4:56 PM  Between 7am to 6pm - Pager [phone]  After 6pm go to [email or web address] - Proofreader  [facility]  Office  [phone]  CC: Primary care physician; Arnetha Courser, MD  Note: This dictation was prepared with Dragon dictation along with smaller phrase technology. Any transcriptional errors that result from this process are unintentional.

## 2018-11-20 NOTE — Consult Note (Signed)
[name], MD [address]  [place]  [place], [place] [address]  Main: [phone]  Fax: [phone] Pager: [phone]   Consultation  Referring Provider:     No ref. provider found Primary Care Physician:  Arnetha Courser, MD Primary Gastroenterologist:  Dr. Sherri Sear         Reason for Consultation:     Rectal bleeding  Date of Admission:  11/19/2018 Date of Consultation:  11/20/2018         HPI:   Joanna Hall is a 53 y.o. 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  Patient has chronic diarrhea and it has been quite challenging to keep her diarrhea under control despite being on several antidiarrheal medications.  This has delayed to undergo chemotherapy for cervical cancer. She does have hemorrhoids and anal condylomata that were removed by colorectal surgery here.  She underwent imaging, endoscopic evaluation and neuroendocrine work-up which has been negative, there is no evidence of malabsorption. I deferred video capsule endoscopy due to concern for retained capsule given her history of surgery. Patient gained about 6 pounds in last few weeks.  I referred her to [facility] as patient preferred for second opinion regarding chronic diarrhea, she has appointment in September 2020.  I placed urgent referral to [facility] and waiting to hear back from them.  With regards to rectal bleeding, patient started noticing bright red blood per rectum since day before yesterday, it was on wiping and also noticed a toilet turning into red.  She had flareup of diarrhea since Sunday, she reports significant abdominal bloating and gas during episodes of diarrhea.  Today, she denies any rectal bleeding.  Her hemoglobin  dropped from 11.8-10 last 24 hours. She continues to have liquid yellow stools.  C. difficile negative.  Xarelto is on hold since Sunday She denies drinking sodas maybe occasionally.  She has limited intake of daily products.  She denies artificial sweeteners, processed foods.  She reports taking tincture of opium, budesonide, hydrocortisone, Lomotil, Viberzi, hyoscyamine and amitriptyline  NSAIDs: None  Antiplts/Anticoagulants/Anti thrombotics: Xarelto for factor V Leyden mutation, on hold  GI Procedures:  Colonoscopy 10/10/2018 The perianal and digital rectal examinations were normal. There was evidence of a prior end-to-side ileo-colonic anastomosis in the transverse colon. This was patent and was characterized by healthy appearing mucosa. The anastomosis was traversed. 3 aphthous ulcers in the neoterminal ileum, biopsied DIAGNOSIS:  A. TERMINAL ILEUM, ULCER; COLD BIOPSY:  - ENTERIC MUCOSA WITH NORMAL VILLOUS ARCHITECTURE.  - INFLAMED SQUAMOUS MUCOSA.  - FRAGMENTS OF FIBRINOPURULENT ULCER DEBRIS.  - NEGATIVE FOR DYSPLASIA AND MALIGNANCY.  Immunohistochemical studies directed against HSV 1/2 and CMV, with  appropriately reactive controls, are negative. A GMS special stain is  negative for fungal organisms.   EGD and colonoscopy 07/30/2018 DIAGNOSIS:  A. DUODENUM; COLD BIOPSY:  - MODERATE DUODENOPATHY WITH VILLOUS BLUNTING, SEE COMMENT.   Comment:  The duodenal biopsies show shortened and shrunken villi with reactive  changes of surface epithelium. Intraepithelial lymphocytes are not  increased. Peptic duodenitis and medication effect should be considered.  The features are not typical for malabsorption, but clinical correlation  is recommended.   B. NEO-TERMINAL ILEUM; COLD BIOPSY:  - ILEAL MUCOSA WITH INTACT VILLI.  -  NEGATIVE FOR ACTIVE INFLAMMATION, INTRAEPITHELIAL LYMPHOCYTOSIS,  INFECTIOUS AGENTS, AND GRANULOMAS.  - NEGATIVE FOR DYSPLASIA AND MALIGNANCY.   C.  COLON; RANDOM COLD BIOPSY:  - NONSPECIFIC CRYPT HYPERPLASIA.  - NEGATIVE FOR ACTIVE INFLAMMATION AND MICROSCOPIC COLITIS.  - NEGATIVE FOR DYSPLASIA AND MALIGNANCY.   EGD 12/30/2017 - Normal duodenal bulb and second portion of the duodenum. - Erythematous mucosa in the stomach. Biopsied. - Normal gastroesophageal junction and esophagus.  Colonoscopy 12/30/2017 - Perianal condylomata found on perianal exam. - Patent end-to-side ileo-colonic anastomosis, characterized by healthy appearing mucosa. - The examined portion of the ileum was normal. Biopsied. - Multiple non-bleeding colonic angioectasias. - Normal mucosa in the entire examined colon. Biopsied. DIAGNOSIS:  A. STOMACH, ANTRUM; COLD BIOPSY:  - MILD CHRONIC GASTRITIS AND REGENERATIVE/REPARATIVE CHANGE.  - NEGATIVE FOR H. PYLORI, DYSPLASIA AND MALIGNANCY.   B. STOMACH, BODY; COLD BIOPSY:  - MINIMAL CHRONIC GASTRITIS AND REGENERATIVE/REPARATIVE CHANGE.  - NEGATIVE FOR H. PYLORI, DYSPLASIA AND MALIGNANCY.   Note: The histologic findings in the antrum and body resemble reactive  gastropathy. Etiologies include drugs/chemical injury (NSAIDs vs.  other), bile reflux, and changes adjacent to an area of healing  ulceration. Clinical correlation with endoscopic findings is required.   C. TERMINAL ILEUM; COLD BIOPSY:  - PARTIALLY FRAGMENTED SMALL BOWEL MUCOSA WITH OVERALL INTACT VILLOUS  ARCHITECTURE.  - NEGATIVE FOR INTRAEPITHELIAL LYMPHOCYTOSIS, DYSPLASIA AND MALIGNANCY.   D. RANDOM COLON; COLD BIOPSY:  - COLONIC MUCOSA NEGATIVE FOR MICROSCOPIC COLITIS, DYSPLASIA AND  MALIGNANCY.   Colonoscopy at [facility] 10/05/2016 Impression:           - One diminutive polyp in the cecum,                        removed with a cold biopsy forceps.                        Resected and retrieved.                       - One 5 mm polyp in the cecum,                        removed with a jumbo cold forceps.                        Resected and  retrieved.                       - One 6 mm polyp in the cecum,                        removed with a hot snare. Resected                        and retrieved.                       - One 5 mm polyp in the descending                        colon, removed with a jumbo cold                        forceps. Resected and retrieved. Date Collected: 10/05/2016 14:48 Diagnosis A.COLON, LABELED AS "CECAL  POLYP", BIOPSY:  TUBULAR ADENOMA  SESSILE SERRATED ADENOMA  B.COLON, LABELED AS "TRANSVERSE COLON POLYP", BIOPSY:  TUBULAR ADENOMA  Past Medical History:  Diagnosis Date  . Abdominal pain 06/10/2018  . Abnormal cervical Papanicolaou smear 09/18/2017  . Anxiety   . Aortic atherosclerosis ([place])   . Arthritis    neck and knees  . 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 Blood transfusion without reported diagnosis   . Cervical cancer ([place]) 09/2016   mets lung  . Chronic anal fissure   . Chronic diarrhea   . Dyspnea   . Erosive gastropathy 09/18/2017  . Factor V Leiden mutation ([place])   . Fecal incontinence   . Genital warts   . GERD (gastroesophageal reflux disease)   . Heart murmur   . Hematochezia   . Hemorrhoids   . 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 Hepatitis, chronic ([place]) 05/05/2017  . History of cancer chemotherapy    completed 06/2017  . History of Clostridium difficile infection    while undergoing chemo.  Negative test 10/2017  . Ileocolic anastomotic leak   . 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 Intestinal infection due to Clostridium difficile 09/18/2017  . Macrocytic anemia with vitamin B12 deficiency   . Multiple gastric ulcers   . Nausea vomiting and diarrhea   . Pancolitis ([place]) 07/27/2018  . Perianal condylomata   . Pneumonia    History of  . Pulmonary nodules   . Rectal bleeding   . Small bowel obstruction ([place]) 08/2017  . Stiff neck    limited right turn  . Vitamin D deficiency      Past Surgical History:  Procedure Laterality Date  . CHOLECYSTECTOMY    . COLON SURGERY  08/2017   resection  . COLONOSCOPY WITH PROPOFOL N/A 12/20/2017   Procedure: COLONOSCOPY WITH PROPOFOL;  Surgeon: [name], MD;  Location: [facility] ENDOSCOPY;  Service: Gastroenterology;  Laterality: N/A;  . COLONOSCOPY WITH PROPOFOL N/A 07/30/2018   Procedure: COLONOSCOPY WITH PROPOFOL;  Surgeon: [name], MD;  Location: [facility] ENDOSCOPY;  Service: Gastroenterology;  Laterality: N/A;  . COLONOSCOPY WITH PROPOFOL N/A 10/10/2018   Procedure: COLONOSCOPY WITH PROPOFOL;  Surgeon: Lucilla Lame, MD;  Location: [facility] ENDOSCOPY;  Service: Endoscopy;  Laterality: N/A;  . DIAGNOSTIC LAPAROSCOPY    . ESOPHAGOGASTRODUODENOSCOPY (EGD) WITH PROPOFOL N/A 12/20/2017   Procedure: ESOPHAGOGASTRODUODENOSCOPY (EGD) WITH PROPOFOL;  Surgeon: [name], MD;  Location: [place];  Service: Gastroenterology;  Laterality: N/A;  . ESOPHAGOGASTRODUODENOSCOPY (EGD) WITH PROPOFOL  07/30/2018   Procedure: ESOPHAGOGASTRODUODENOSCOPY (EGD) WITH PROPOFOL;  Surgeon: [name], MD;  Location: [facility] ENDOSCOPY;  Service: Gastroenterology;;  . LAPAROTOMY N/A 08/31/2017   Procedure: EXPLORATORY LAPAROTOMY for SBO, ileocolectomy, removal of piece of uterine wall;  Surgeon: [name], MD;  Location: [facility] ORS;  Service: General;  Laterality: N/A;  . LASER ABLATION CONDOLAMATA N/A 02/22/2018   Procedure: LASER ABLATION/REMOVAL OF [id number] AROUND ANUS AND VAGINA;  Surgeon: Michael Boston, MD;  Location: [place];  Service: General;  Laterality: N/A;  . OOPHORECTOMY    . PORTA CATH INSERTION N/A 05/13/2018   Procedure: PORTA CATH INSERTION;  Surgeon: Katha Cabal, MD;  Location: [place] CV LAB;  Service: Cardiovascular;  Laterality: N/A;  . SMALL INTESTINE SURGERY    . TANDEM RING INSERTION     x3  . THORACOTOMY    .  TOTAL KNEE ARTHROPLASTY Left 04/24/2018   Procedure:  TOTAL KNEE ARTHROPLASTY;  Surgeon: Lovell Sheehan, MD;  Location: [facility] ORS;  Service: Orthopedics;  Laterality: Left;    Prior to Admission medications   Medication Sig Start Date End Date Taking? Authorizing Provider  amitriptyline (ELAVIL) 75 MG tablet Take 1 tablet (75 mg total) by mouth at bedtime. 11/11/18 02/09/19 Yes Cyncere Sontag, Tally Due, MD  budesonide (ENTOCORT EC) 3 MG 24 hr capsule Take 3 capsules (9 mg total) by mouth daily. 10/12/18  Yes Gouru, Illene Silver, MD  Calcium Carb-Cholecalciferol (CALCIUM 500 +D) 500-400 MG-UNIT TABS Take 2 tablets by mouth daily.   Yes [provider]  diphenoxylate-atropine (LOMOTIL) 2.5-0.025 MG tablet Take 2 tablets by mouth 4 (four) times daily. 10/11/18  Yes Gouru, Illene Silver, MD  hydrocortisone (CORTEF) 10 MG tablet Take 1 tablet (10 mg total) by mouth 2 (two) times daily. 11/11/18 02/09/19 Yes Calena [place], Tally Due, MD  hyoscyamine (LEVBID) 0.375 MG 12 hr tablet Take 1 tablet (0.375 mg total) by mouth 2 (two) times daily. 10/21/18  Yes Zyan Coby, Tally Due, MD  Multiple Vitamins-Minerals (MULTIVITAMIN WITH MINERALS) tablet Take 1 tablet by mouth daily. 08/01/18 08/01/19 Yes Wieting, Richard, MD  octreotide (SANDOSTATIN LAR DEPOT) 20 MG injection Inject 20 mg into the muscle every 28 (twenty-eight) days. 10/23/18  Yes Mabry Tift, Tally Due, MD  ondansetron (ZOFRAN) 8 MG tablet Take 1 tablet (8 mg total) by mouth every 8 (eight) hours as needed for nausea or vomiting. 11/07/18  Yes Karen Kitchens, NP  oxyCODONE (OXY IR/ROXICODONE) 5 MG immediate release tablet Take 1 tablet (5 mg total) by mouth every 6 (six) hours as needed for moderate pain or severe pain. 11/10/18  Yes Karen Kitchens, NP  pantoprazole (PROTONIX) 40 MG tablet Take 1 tablet (40 mg total) by mouth daily. 10/30/18  Yes Karen Kitchens, NP  potassium chloride SA (K-DUR,KLOR-CON) 20 MEQ tablet Take 1 tablet (20 mEq total) by mouth daily. for 4 days. 11/06/18  Yes Corcoran, Drue Second, MD  VIBERZI 100 MG TABS TAKE  1 TABLET BY MOUTH TWICE DAILY WITH  A  MEAL 10/11/18  Yes Verlon Au, NP  promethazine (PHENERGAN) 25 MG tablet Take 1 tablet (25 mg total) by mouth every 6 (six) hours as needed for nausea or vomiting. Patient not taking: Reported on 11/19/2018 10/17/18   Karen Kitchens, NP  VENTOLIN HFA 108 (90 Base) MCG/ACT inhaler Inhale 1-2 puffs into the lungs every 4 (four) hours as needed for shortness of breath.     [provider]   Current Facility-Administered Medications:  .  [START ON 11/21/2018] 0.9 %  sodium chloride infusion, , Intravenous, Continuous, Mayo, Pete Pelt, MD .  albuterol (PROVENTIL) (2.5 MG/3ML) 0.083% nebulizer solution 3 mL, 3 mL, Inhalation, Q4H PRN, Gouru, Aruna, MD .  amitriptyline (ELAVIL) tablet 75 mg, 75 mg, Oral, QHS, Sedalia Muta, MD, 75 mg at 11/20/18 0133 .  feeding supplement (ENSURE ENLIVE) (ENSURE ENLIVE) liquid 237 mL, 237 mL, Oral, BID BM, Shifa Brisbon, Tally Due, MD .  hydrocortisone (CORTEF) tablet 10 mg, 10 mg, Oral, BID, Gouru, Aruna, MD, 10 mg at 11/19/18 2245 .  morphine 2 MG/ML injection 2 mg, 2 mg, Intravenous, Q3H PRN, Mayo, Pete Pelt, MD, 2 mg at 11/20/18 1327 .  ondansetron (ZOFRAN) tablet 4 mg, 4 mg, Oral, Q6H PRN **OR** ondansetron (ZOFRAN) injection 4 mg, 4 mg, Intravenous, Q6H PRN, Gouru, Aruna, MD, 4 mg at 11/20/18 1026 .  oxyCODONE (Oxy IR/ROXICODONE) immediate  release tablet 10 mg, 10 mg, Oral, Q6H PRN, Hillary Bow, MD, 10 mg at 11/20/18 0506 .  pantoprazole (PROTONIX) injection 40 mg, 40 mg, Intravenous, Q12H, Gouru, Aruna, MD, 40 mg at 11/19/18 2247  Facility-Administered Medications Ordered in Other Encounters:  .  heparin lock flush 100 unit/mL, 500 Units, Intravenous, Once, Corcoran, Melissa C, MD .  sodium chloride 0.9 % 1,000 mL with potassium chloride 20 mEq infusion, , Intravenous, Once, Karen Kitchens, NP   Family History  Problem Relation Age of Onset  . Hypertension Father   . Diabetes Father   . Alcohol abuse Daughter   .  Hypertension Maternal Grandmother   . Diabetes Maternal Grandmother   . Diabetes Paternal Grandmother   . Hypertension Paternal Grandmother      Social History   Tobacco Use  . Smoking status: Former Smoker    Last attempt to quit: 10/16/2006    Years since quitting: 12.1  . Smokeless tobacco: Never Used  Substance Use Topics  . Alcohol use: Not Currently    Frequency: Never    Comment: seldom  . Drug use: Yes    Types: Marijuana    Allergies as of 11/19/2018 - Review Complete 11/19/2018  Allergen Reaction Noted  . Ketamine Anxiety and Other (See Comments) 02/22/2017    Review of Systems:    All systems reviewed and negative except where noted in HPI.   Physical Exam:  Vital signs in last 24 hours: Temp:  [97.5 F (36.4 C)-97.9 F (36.6 C)] 97.5 F (36.4 C) (02/05 0450) Pulse Rate:  [66-84] 66 (02/05 0450) Resp:  [14-18] 14 (02/05 0450) BP: (119-147)/(69-91) 147/91 (02/05 0450) SpO2:  [97 %-100 %] 97 % (02/05 0450) Weight:  [80.7 kg] 80.7 kg (02/05 0450) Last BM Date: 11/19/18  General:   Pleasant, cooperative in NAD, appears weak Head:  Normocephalic and atraumatic. Eyes:   No icterus.   Conjunctiva pink. PERRLA. Ears:  Normal auditory acuity. Neck:  Supple; no masses or thyroidomegaly Lungs: Respirations even and unlabored. Lungs clear to auscultation bilaterally.   No wheezes, crackles, or rhonchi.  Heart:  Regular rate and rhythm;  Without murmur, clicks, rubs or gallops Abdomen:  Soft, nondistended, nontender. Normal bowel sounds. No appreciable masses or hepatomegaly.  No rebound or guarding.  Rectal:  Not performed. Msk:  Symmetrical without gross deformities.  Strength generalized weakness Extremities:  Without edema, cyanosis or clubbing. Neurologic:  Alert and oriented x3;  grossly normal neurologically. Skin:  Intact without significant lesions or rashes. Psych:  Alert and cooperative. Normal affect.  LAB RESULTS: CBC Latest Ref Rng & Units 11/20/2018  11/20/2018 11/19/2018  WBC 4.0 - 10.5 K/uL 2.4(L) - -  Hemoglobin 12.0 - 15.0 g/dL 9.9(L) 9.6(L) 9.5(L)  Hematocrit 36.0 - 46.0 % 30.7(L) 29.8(L) 29.5(L)  Platelets 150 - 400 K/uL 124(L) - -    BMET BMP Latest Ref Rng & Units 11/20/2018 11/19/2018 11/13/2018  Glucose 70 - 99 mg/dL 94 107(H) 91  BUN 6 - 20 mg/dL 9 14 18   Creatinine 0.44 - 1.00 mg/dL 0.73 0.88 0.94  Sodium 135 - 145 mmol/L 136 137 134(L)  Potassium 3.5 - 5.1 mmol/L 4.3 3.8 4.2  Chloride 98 - 111 mmol/L 106 103 104  CO2 22 - 32 mmol/L 27 28 25   Calcium 8.9 - 10.3 mg/dL 8.5(L) 9.1 8.0(L)    LFT Hepatic Function Latest Ref Rng & Units 11/20/2018 10/28/2018 10/21/2018  Total Protein 6.5 - 8.1 g/dL 6.9 9.2(H) 8.6(H)  Albumin 3.5 -  5.0 g/dL 3.0(L) 4.0 4.4  AST 15 - 41 U/L 37 34 48(H)  ALT 0 - 44 U/L 44 49(H) 67(H)  Alk Phosphatase 38 - 126 U/L 114 164(H) 185(H)  Total Bilirubin 0.3 - 1.2 mg/dL 0.3 0.8 1.4(H)  Bilirubin, Direct 0.0 - 0.2 mg/dL - - 0.3(H)     STUDIES: No results found.    Impression / Plan:   Joanna Hall is a 53 y.o. female with metastatic cervical cancer, status post chemo and radiation complicated by radiation stricture, small bowel obstruction status post resection and ileocolic anastomosis, chronic unexplained diarrhea, hemorrhoids, intermittent rectal bleeding  Chronic diarrhea She had an extensive work-up to date which was fairly unremarkable except for some nonspecific features in the small bowel. Will perform flex sig tomorrow with biopsies I discussed in length with her about utility of video capsule endoscopy and risk associated with it including retained capsule and possible surgery.  Despite this, patient is willing to undergo video capsule endoscopy as she would like to get this diarrhea under control to proceed with chemotherapy Will check stool electrolytes, stool osmolality Hold off on all antidiarrheal medications at this time to study the nature of her diarrhea.  I will also keep her  n.p.o. after the capsule study tomorrow   Rectal bleeding, bright red blood per rectum, currently resolved Hemoglobin is stable since yesterday Most likely outlet bleeding from hemorrhoids Unsedated flexible sigmoidoscopy tomorrow with possible banding Xarelto is on hold since Sunday last week  Thank you for involving me in the care of this patient.  Will follow along with you    LOS: 1 day   Sherri Sear, MD  11/20/2018, 5:02 PM   Note: This dictation was prepared with Dragon dictation along with smaller phrase technology. Any transcriptional errors that result from this process are unintentional.

## 2018-11-21 ENCOUNTER — Encounter
Admission: AD | Disposition: A | Discharge status: Home / Self Care | Payer: Self-pay | Source: Ambulatory Visit | Attending: Internal Medicine

## 2018-11-21 ENCOUNTER — Inpatient Hospital Stay: Payer: Medicaid Other

## 2018-11-21 ENCOUNTER — Encounter: Payer: Self-pay | Admitting: Student

## 2018-11-21 DIAGNOSIS — R197 Diarrhea, unspecified: Secondary | ICD-10-CM

## 2018-11-21 DIAGNOSIS — K627 Radiation proctitis: Principal | ICD-10-CM

## 2018-11-21 HISTORY — PX: FLEXIBLE SIGMOIDOSCOPY: SHX5431

## 2018-11-21 LAB — CBC
HCT: 31.3 % — ABNORMAL LOW (ref 36.0–46.0)
Hemoglobin: 10.2 g/dL — ABNORMAL LOW (ref 12.0–15.0)
MCH: 34.6 pg — ABNORMAL HIGH (ref 26.0–34.0)
MCHC: 32.6 g/dL (ref 30.0–36.0)
MCV: 106.1 fL — ABNORMAL HIGH (ref 80.0–100.0)
Platelets: 133 10*3/uL — ABNORMAL LOW (ref 150–400)
RBC: 2.95 MIL/uL — ABNORMAL LOW (ref 3.87–5.11)
RDW: 14.2 % (ref 11.5–15.5)
WBC: 2.4 10*3/uL — ABNORMAL LOW (ref 4.0–10.5)
nRBC: 0 % (ref 0.0–0.2)

## 2018-11-21 LAB — BASIC METABOLIC PANEL
Anion gap: 4 — ABNORMAL LOW (ref 5–15)
BUN: 7 mg/dL (ref 6–20)
CALCIUM: 8.6 mg/dL — AB (ref 8.9–10.3)
CO2: 28 mmol/L (ref 22–32)
Chloride: 103 mmol/L (ref 98–111)
Creatinine, Ser: 0.74 mg/dL (ref 0.44–1.00)
GFR calc Af Amer: >60 mL/min (ref >60)
GFR calc non Af Amer: >60 mL/min (ref >60)
Glucose, Bld: 93 mg/dL (ref 70–99)
Potassium: 3.9 mmol/L (ref 3.5–5.1)
Sodium: 135 mmol/L (ref 135–145)

## 2018-11-21 LAB — MAGNESIUM: Magnesium: 1.6 mg/dL — ABNORMAL LOW (ref 1.7–2.4)

## 2018-11-21 SURGERY — SIGMOIDOSCOPY, FLEXIBLE
Anesthesia: General

## 2018-11-21 MED ORDER — LIDOCAINE HCL URETHRAL/MUCOSAL 2 % EX GEL
CUTANEOUS | Status: AC
  Filled 2018-11-21: qty 10

## 2018-11-21 MED ORDER — FENTANYL CITRATE (PF) 100 MCG/2ML IJ SOLN
INTRAMUSCULAR | Status: AC
  Administered 2018-11-21: 100 ug
  Filled 2018-11-21: qty 2

## 2018-11-21 MED ORDER — MAGNESIUM SULFATE 2 GM/50ML IV SOLN
2.0000 g | Freq: Once | INTRAVENOUS | Status: AC
  Administered 2018-11-21: 2 g via INTRAVENOUS
  Filled 2018-11-21: qty 50

## 2018-11-21 MED ORDER — LIDOCAINE HCL URETHRAL/MUCOSAL 2 % EX GEL
CUTANEOUS | Status: DC | PRN
  Administered 2018-11-21 (×2): 1

## 2018-11-21 NOTE — Progress Notes (Signed)
[place] [facility] [place] NAME: Joanna Hall    MR#:  [id number]  DATE OF BIRTH:  [birth year]  SUBJECTIVE:   Doing okay this morning.  She did have a bowel movement this morning, but has not noticed any blood in her stool.  She continues having some lower abdominal pain.  REVIEW OF SYSTEMS:  Review of Systems  Constitutional: Positive for malaise/fatigue. Negative for chills and fever.  HENT: Negative for congestion and sore throat.   Eyes: Negative for blurred vision and double vision.  Respiratory: Negative for cough and shortness of breath.   Cardiovascular: Negative for chest pain and palpitations.  Gastrointestinal: Positive for abdominal pain, blood in stool and diarrhea. Negative for melena, nausea and vomiting.  Genitourinary: Negative for dysuria, hematuria and urgency.  Musculoskeletal: Negative for back pain and neck pain.  Neurological: Positive for weakness. Negative for dizziness, focal weakness and headaches.  Psychiatric/Behavioral: Negative for depression. The patient is not nervous/anxious.     DRUG ALLERGIES:   Allergies  Allergen Reactions  . Ketamine Anxiety and Other (See Comments)    Syncope episode/confusion    VITALS:  Blood pressure (!) 164/85, pulse 66, temperature (!) 97.1 F (36.2 C), resp. rate 14, height 5' 8"  (1.727 m), weight 80.7 kg, SpO2 100 %. PHYSICAL EXAMINATION:  Physical Exam  GENERAL:  53 y.o.-year-old patient lying in the bed with no acute distress.  Chronically ill-appearing. EYES: Pupils equal, round, reactive to light and accommodation. No scleral icterus. Extraocular muscles intact.  HEENT: Head atraumatic, normocephalic. Oropharynx and nasopharynx clear.  NECK:  Supple, no jugular venous distention. No thyroid enlargement, no tenderness.  LUNGS: Normal breath sounds bilaterally, no wheezing, rales,rhonchi or crepitation. No use of accessory muscles of respiration.  CARDIOVASCULAR:  RRR, S1, S2 normal. No murmurs, rubs, or gallops.  ABDOMEN: Soft, + generalized tenderness across the lower abdomen, no rebound or guarding, nondistended. Bowel sounds present.  EXTREMITIES: No pedal edema, cyanosis, or clubbing.  NEUROLOGIC: CN 2-12 grossly intact, +global weakness. Sensation intact. Gait not checked.  PSYCHIATRIC: The patient is alert and oriented x 3.  SKIN: No obvious rash, lesion, or ulcer.  LABORATORY PANEL:  Female CBC Recent Labs  Lab 11/21/18 0624  WBC 2.4*  HGB 10.2*  HCT 31.3*  PLT 133*   ------------------------------------------------------------------------------------------------------------------ Chemistries  Recent Labs  Lab 11/20/18 0515  11/21/18 0624  NA 136   < > 135  K 4.3   < > 3.9  CL 106  --  103  CO2 27  --  28  GLUCOSE 94  --  93  BUN 9  --  7  CREATININE 0.73  --  0.74  CALCIUM 8.5*  --  8.6*  MG 1.7   < > 1.6*  AST 37  --   --   ALT 44  --   --   ALKPHOS 114  --   --   BILITOT 0.3  --   --    < > = values in this interval not displayed.   RADIOLOGY:  No results found. ASSESSMENT AND PLAN:   Acute lower GI bleed with history of multiple GI ulcers in the past- hemoglobin has been stable. -GI consulted- plan for flex sig today -Continue PPI -Xarelto discontinued, last dose was on 11/17/2018  Stage IV cervical cancer on chemotherapy -Oncology following  Hypomagnesemia- mag remains low -Replete and recheck  Acute on chronic diarrhea- improved -C. Diff negative  Chronic pancytopenia- likely related  to chemo -Oncology following  History of hepatitis C -Needs to follow-up with gastroenterology at [place] records are reviewed and case discussed with Care Management/Social Worker. Management plans discussed with the patient, family and they are in agreement.  CODE STATUS: Full Code  TOTAL TIME TAKING CARE OF THIS PATIENT: 35 minutes.   More than 50% of the time was spent in counseling/coordination of care:  YES  POSSIBLE D/C IN 1-2 DAYS, DEPENDING ON CLINICAL CONDITION.   Berna Spare Kalicia Dufresne M.D on 11/21/2018 at 2:15 PM  Between 7am to 6pm - Pager [phone]  After 6pm go to [email or web address] - Proofreader  [employer] [facility]  Office  [phone]  CC: Primary care physician; Arnetha Courser, MD  Note: This dictation was prepared with Dragon dictation along with smaller phrase technology. Any transcriptional errors that result from this process are unintentional.

## 2018-11-21 NOTE — Progress Notes (Signed)
Video capsule study was inconclusive.  Extended duration of capsule in the stomach.  Very limited small bowel images, light brown stool without any evidence of bleeding visualized.  Unclear if capsule reached cecum.  Flexible sigmoidoscopy revealed brown stool, normal underlying mucosa, random colon biopsies were performed.  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

## 2018-11-21 NOTE — Op Note (Signed)
[facility] Gastroenterology Patient Name: Joanna Hall Procedure Date: 11/21/2018 1:11 PM MRN: [id number] Account #: [id number] Date of Birth: 12/15/1965 Admit Type: Inpatient Age: 53 Room: [facility] ENDO ROOM 1 Gender: Female Note Status: Finalized Procedure:            Flexible Sigmoidoscopy Indications:          Rectal hemorrhage, Chronic diarrhea Providers:            [name] MD, MD Referring MD:         Arnetha Courser (Referring MD) Medicines:            None Complications:        No immediate complications. Estimated blood loss:                        Minimal. Procedure:            Pre-Anesthesia Assessment:                       - Prior to the procedure, a History and Physical was                        performed, and patient medications and allergies were                        reviewed. The patient is competent. The risks and                        benefits of the procedure and the sedation options and                        risks were discussed with the patient. All questions                        were answered and informed consent was obtained.                        Patient identification and proposed procedure were                        verified by the physician, the nurse and the technician                        in the pre-procedure area in the procedure room in the                        endoscopy suite. Mental Status Examination: alert and                        oriented. Airway Examination: normal oropharyngeal                        airway and neck mobility. Respiratory Examination:                        clear to auscultation. CV Examination: normal.                        Prophylactic Antibiotics: The patient does not require  prophylactic antibiotics. Prior Anticoagulants: The                        patient has taken no previous anticoagulant or                        antiplatelet agents. ASA Grade  Assessment: III - A                        patient with severe systemic disease. After reviewing                        the risks and benefits, the patient was deemed in                        satisfactory condition to undergo the procedure. The                        anesthesia plan was to use no sedation or anesthesia.                        Immediately prior to administration of medications, the                        patient was re-assessed for adequacy to receive                        sedatives. The heart rate, respiratory rate, oxygen                        saturations, blood pressure, adequacy of pulmonary                        ventilation, and response to care were monitored                        throughout the procedure. The physical status of the                        patient was re-assessed after the procedure.                       After obtaining informed consent, the scope was passed                        under direct vision. The Endoscope was introduced                        through the anus and advanced to the the left                        transverse colon. The flexible sigmoidoscopy was                        accomplished without difficulty. The patient tolerated                        the procedure fairly well. The quality of the bowel  preparation was adequate. Findings:      The perianal and digital rectal examinations were normal. Pertinent       negatives include normal sphincter tone and no palpable rectal lesions.      Normal mucosa was found in the sigmoid colon, in the descending colon       and in the transverse colon. Biopsies for histology were taken with a       cold forceps from the transverse colon, descending colon and sigmoid       colon for evaluation of microscopic colitis.      A patchy area of mildly erythematous mucosa was found in the rectum,       consistent with radiation proctitis. This was biopsied with a cold        forceps for histology. Coagulation for bleeding prevention using argon       plasma was successful. Estimated blood loss was minimal. Short and       straight rectum from radiation treatment in the past      Non-bleeding internal hemorrhoids were found during retroflexion. The       hemorrhoids were large. Impression:           - Normal mucosa in the sigmoid colon, in the descending                        colon and in the transverse colon. Biopsied.                       - Erythematous mucosa in the rectum. Biopsied. Treated                        with argon plasma coagulation (APC).                       - Non-bleeding internal hemorrhoids, no stigmata of                        recent bleeding, therefore banding was not performed. Recommendation:       - Return patient to hospital ward for ongoing care.                       - Full liquid diet today.                       - Recommend inpatient transfer to [facility] or [facility] to seek                        expert consultation from GI for chronic unexplained                        diarrhea Procedure Code(s):    --- Professional ---                       [phone], 59, Sigmoidoscopy, flexible; with control of                        bleeding, any method                       45331, Sigmoidoscopy, flexible; with biopsy, single or  multiple Diagnosis Code(s):    --- Professional ---                       K64.8, Other hemorrhoids                       K62.89, Other specified diseases of anus and rectum                       K62.5, Hemorrhage of anus and rectum                       K52.9, Noninfective gastroenteritis and colitis,                        unspecified CPT copyright 2018 American Medical Association. All rights reserved. The codes documented in this report are preliminary and upon coder review may  be revised to meet current compliance requirements. Dr. Ulyess Mort [name] MD, MD 11/21/2018 1:45:44 PM This  report has been signed electronically. Number of Addenda: 0 Note Initiated On: 11/21/2018 1:11 PM Scope Withdrawal Time: 0 hours 11 minutes 42 seconds  Total Procedure Duration: 0 hours 18 minutes 36 seconds       [facility]

## 2018-11-22 ENCOUNTER — Encounter: Payer: Self-pay | Admitting: Hematology and Oncology

## 2018-11-22 ENCOUNTER — Encounter: Payer: Self-pay | Admitting: Gastroenterology

## 2018-11-22 DIAGNOSIS — K529 Noninfective gastroenteritis and colitis, unspecified: Secondary | ICD-10-CM

## 2018-11-22 DIAGNOSIS — K921 Melena: Secondary | ICD-10-CM

## 2018-11-22 DIAGNOSIS — C799 Secondary malignant neoplasm of unspecified site: Secondary | ICD-10-CM

## 2018-11-22 DIAGNOSIS — Z87891 Personal history of nicotine dependence: Secondary | ICD-10-CM

## 2018-11-22 DIAGNOSIS — C539 Malignant neoplasm of cervix uteri, unspecified: Secondary | ICD-10-CM

## 2018-11-22 LAB — BASIC METABOLIC PANEL
Anion gap: 3 — ABNORMAL LOW (ref 5–15)
BUN: 8 mg/dL (ref 6–20)
CHLORIDE: 104 mmol/L (ref 98–111)
CO2: 28 mmol/L (ref 22–32)
Calcium: 8.4 mg/dL — ABNORMAL LOW (ref 8.9–10.3)
Creatinine, Ser: 0.69 mg/dL (ref 0.44–1.00)
GFR calc Af Amer: >60 mL/min (ref >60)
GFR calc non Af Amer: >60 mL/min (ref >60)
GLUCOSE: 100 mg/dL — AB (ref 70–99)
Potassium: 3.8 mmol/L (ref 3.5–5.1)
Sodium: 135 mmol/L (ref 135–145)

## 2018-11-22 LAB — IMMUNOGLOBULINS A/E/G/M, SERUM
IgA: 206 mg/dL (ref 87–352)
IgE (Immunoglobulin E), Serum: 34 IU/mL (ref 6–495)
IgG (Immunoglobin G), Serum: 2236 mg/dL — ABNORMAL HIGH (ref 700–1600)
IgM (Immunoglobulin M), Srm: 78 mg/dL (ref 26–217)

## 2018-11-22 LAB — CBC
HEMATOCRIT: 31.2 % — AB (ref 36.0–46.0)
Hemoglobin: 10.1 g/dL — ABNORMAL LOW (ref 12.0–15.0)
MCH: 34.8 pg — ABNORMAL HIGH (ref 26.0–34.0)
MCHC: 32.4 g/dL (ref 30.0–36.0)
MCV: 107.6 fL — AB (ref 80.0–100.0)
Platelets: 135 10*3/uL — ABNORMAL LOW (ref 150–400)
RBC: 2.9 MIL/uL — ABNORMAL LOW (ref 3.87–5.11)
RDW: 14 % (ref 11.5–15.5)
WBC: 3.9 10*3/uL — ABNORMAL LOW (ref 4.0–10.5)
nRBC: 0 % (ref 0.0–0.2)

## 2018-11-22 LAB — OSMOLALITY, STOOL

## 2018-11-22 MED ORDER — DIPHENOXYLATE-ATROPINE 2.5-0.025 MG PO TABS
2.0000 | ORAL_TABLET | Freq: Four times a day (QID) | ORAL | Status: DC
  Administered 2018-11-22 – 2018-11-24 (×10): 2 via ORAL
  Filled 2018-11-22 (×12): qty 2

## 2018-11-22 MED ORDER — FAMOTIDINE 20 MG PO TABS: 20.0000 mg | ORAL_TABLET | Freq: Two times a day (BID) | ORAL | Status: DC | PRN

## 2018-11-22 NOTE — Progress Notes (Signed)
[facility] Hematology/Oncology Progress Note  Date of admission: 11/19/2018  Hospital day:  11/22/2018  Chief Complaint: Joanna Hall is a 53 y.o. female with metastatic cervical cancer who is admitted from the medical oncology clinic with rectal bleeding.  Subjective: Cramping pain continues in right lower quadrant.  No further bleeding.  On clear liquids bowel movements 2 watery bowel movements/day.  Regular diet today resulted in immediate emesis and voluminous diarrhea.  Social History: The patient is alone today.  Allergies:  Allergies  Allergen Reactions  . Ketamine Anxiety and Other (See Comments)    Syncope episode/confusion     Scheduled Medications: . amitriptyline  75 mg Oral QHS  . feeding supplement (ENSURE ENLIVE)  237 mL Oral BID BM  . hydrocortisone  10 mg Oral BID  . pantoprazole (PROTONIX) IV  40 mg Intravenous Q12H    Review of Systems: GENERAL:  Fatigue.  No fevers, sweats.  Weight loss. PERFORMANCE STATUS (ECOG):  1-2 HEENT:  Recent full dental extraction.  No visual changes, runny nose, sore throat, mouth sores or tenderness. Lungs: No shortness of breath or cough.  No hemoptysis. Cardiac:  No chest pain, palpitations, orthopnea, or PND. GI:  Lower abdominal chronic pain which periodically intensifies in the RLQ.  Emesis and significant diarrhea this morning.  No constipation, melena or hematochezia. GU:  No urgency, frequency, dysuria, or hematuria. Musculoskeletal:  No back pain.  No joint pain.  No muscle tenderness. Extremities:  No pain or swelling. Skin:  No rashes or skin changes. Neuro:  General weakness.  No headache, numbness or weakness, balance or coordination issues. Endocrine:  No diabetes, thyroid issues, hot flashes or night sweats. Psych:  No mood changes, depression or anxiety. Pain:  No focal pain. Review of systems:  All other systems reviewed and found to be negative.  Physical Exam: Blood pressure 120/85,  pulse 63, temperature (!) 97.4 F (36.3 C), temperature source Oral, resp. rate 16, height 5' 8"  (1.727 m), weight 177 lb 14.6 oz (80.7 kg), SpO2 98 %.  GENERAL:  Chronically fatigued appearing woman sitting comfortably on the medical oncology unit in no acute distress. MENTAL STATUS:  Alert and oriented to person, place and time. HEAD: Alopecia totalis.  Normocephalic, atraumatic, face symmetric, no Cushingoid features. EYES:  Brown eyes.  Pupils equal round and reactive to light and accomodation.  No conjunctivitis or scleral icterus. ENT:  Oropharynx clear without lesion.  Tongue normal.  Edentulous.  Mucous membranes moist.  RESPIRATORY:  Clear to auscultation without rales, wheezes or rhonchi. CARDIOVASCULAR:  Regular rate and rhythm without murmur, rub or gallop. ABDOMEN:  Soft, slightly tender in lower abdomen, with increased discomfort in RLQ.  No guarding or rebound tenderness.  Active bowel sounds and no hepatosplenomegaly.  No masses. SKIN:  No rashes, ulcers or lesions. EXTREMITIES: No edema, no skin discoloration or tenderness.  No palpable cords. LYMPH NODES: No palpable cervical, supraclavicular, axillary or inguinal adenopathy  NEUROLOGICAL: Unremarkable. PSYCH:  Appropriate.   Results for orders placed or performed during the hospital encounter of 11/19/18 (from the past 48 hour(s))  C difficile quick scan w PCR reflex     Status: None   Collection Time: 11/20/18 10:29 AM  Result Value Ref Range   C Diff antigen NEGATIVE NEGATIVE   C Diff toxin NEGATIVE NEGATIVE   C Diff interpretation No C. difficile detected.     Comment: Performed at [facility], [address] [place], [place] [address]  Sodium  Status: Abnormal   Collection Time: 11/20/18  6:00 PM  Result Value Ref Range   Sodium 134 (L) 135 - 145 mmol/L    Comment: Performed at [facility], [address] [place], [place] [address]  Potassium     Status: None   Collection Time: 11/20/18   6:00 PM  Result Value Ref Range   Potassium 3.7 3.5 - 5.1 mmol/L    Comment: Performed at [facility], [address] [place], [place] [address]  Magnesium     Status: Abnormal   Collection Time: 11/20/18  6:00 PM  Result Value Ref Range   Magnesium 1.5 (L) 1.7 - 2.4 mg/dL    Comment: Performed at [facility], [address] [place], [place] [address]  Phosphorus     Status: None   Collection Time: 11/20/18  6:00 PM  Result Value Ref Range   Phosphorus 3.7 2.5 - 4.6 mg/dL    Comment: Performed at [facility], [address] [place], [place] [address]  Troponin I - ONCE - STAT     Status: None   Collection Time: 11/20/18 10:27 PM  Result Value Ref Range   Troponin I <0.03 <0.03 ng/mL    Comment: Performed at  [facility], [address] [place], [place] [address]  CBC     Status: Abnormal   Collection Time: 11/21/18  6:24 AM  Result Value Ref Range   WBC 2.4 (L) 4.0 - 10.5 K/uL   RBC 2.95 (L) 3.87 - 5.11 MIL/uL   Hemoglobin 10.2 (L) 12.0 - 15.0 g/dL   HCT 31.3 (L) 36.0 - 46.0 %   MCV 106.1 (H) 80.0 - 100.0 fL   MCH 34.6 (H) 26.0 - 34.0 pg   MCHC 32.6 30.0 - 36.0 g/dL   RDW 14.2 11.5 - 15.5 %   Platelets 133 (L) 150 - 400 K/uL   nRBC 0.0 0.0 - 0.2 %    Comment: Performed at [facility], [address] [place], [place] [address]  Basic metabolic panel     Status: Abnormal   Collection Time: 11/21/18  6:24 AM  Result Value Ref Range   Sodium 135 135 - 145 mmol/L   Potassium 3.9 3.5 - 5.1 mmol/L   Chloride 103 98 - 111 mmol/L   CO2 28 22 - 32 mmol/L   Glucose, Bld 93 70 - 99 mg/dL   BUN 7 6 - 20 mg/dL   Creatinine, Ser 0.74 0.44 - 1.00 mg/dL   Calcium 8.6 (L) 8.9 - 10.3 mg/dL   GFR calc non Af Amer >60 >60 mL/min   GFR calc Af Amer >60 >60 mL/min   Anion gap 4 (L) 5 - 15    Comment: Performed at [facility], [address], [place], [place] [address]  Magnesium     Status: Abnormal   Collection Time:  11/21/18  6:24 AM  Result Value Ref Range   Magnesium 1.6 (L) 1.7 - 2.4 mg/dL    Comment: Performed at [facility], [address] [place], [place], [place] [address]  CBC     Status: Abnormal   Collection Time: 11/22/18  5:28 AM  Result Value Ref Range   WBC 3.9 (L) 4.0 - 10.5 K/uL   RBC 2.90 (L) 3.87 - 5.11 MIL/uL   Hemoglobin 10.1 (L) 12.0 - 15.0 g/dL   HCT 31.2 (L) 36.0 - 46.0 %   MCV 107.6 (H) 80.0 - 100.0 fL   MCH 34.8 (  H) 26.0 - 34.0 pg   MCHC 32.4 30.0 - 36.0 g/dL   RDW 14.0 11.5 - 15.5 %   Platelets 135 (L) 150 - 400 K/uL   nRBC 0.0 0.0 - 0.2 %    Comment: Performed at [facility], [address] [place], [place] [address]  Basic metabolic panel     Status: Abnormal   Collection Time: 11/22/18  5:28 AM  Result Value Ref Range   Sodium 135 135 - 145 mmol/L   Potassium 3.8 3.5 - 5.1 mmol/L   Chloride 104 98 - 111 mmol/L   CO2 28 22 - 32 mmol/L   Glucose, Bld 100 (H) 70 - 99 mg/dL   BUN 8 6 - 20 mg/dL   Creatinine, Ser 0.69 0.44 - 1.00 mg/dL   Calcium 8.4 (L) 8.9 - 10.3 mg/dL   GFR calc non Af Amer >60 >60 mL/min   GFR calc Af Amer >60 >60 mL/min   Anion gap 3 (L) 5 - 15    Comment: Performed at [facility], [address] [place], [place] [address]   Dg Abd 1 View  Result Date: 11/21/2018 CLINICAL DATA:  Diarrhea. EXAM: ABDOMEN - 1 VIEW COMPARISON:  10/18/2018. FINDINGS: Normal bowel gas pattern. Stable right pelvic phleboliths, cholecystectomy clips and right mid abdominal surgical staples. Interval 1.4 cm metallic object projected over the right mid abdomen to the right of the lumbar spine. Extensive lumbar spine degenerative changes and mild lower thoracic spine degenerative changes. Mild to moderate levoconvex lumbar scoliosis. IMPRESSION: 1. Interval 1.4 cm metallic object projected over the right mid abdomen. This could represent an ingested object such as a small bowel camera. This could also be external to the patient. 2. No acute  abnormality. Electronically Signed   By: Claudie Revering M.D.   On: 11/21/2018 18:39    Assessment:  JESSALYN HINOJOSA is a 53 y.o. female with metastatic cervical cancer admitted with RLQ pain and hematochezia.  Abdomen and pelvic CTon 05/23/2018 revealed colonic wall thickening, consistent with colitis.  She received a 10 day course of ciprofloxacin and Flagyl. Abdomen and pelvic CT angiogram on 09/07/2018 revealed colitis, mainly affecting lower loops   EGDon10/15/2019revealed an erythematous mucosa in the cardia, bilious gastric fluid, and LA grade A reflux esophagitis  Colonoscopyon 10/10/2018 revealed 3 ulcers in the terminal ileum (no evidence of dysplasia or malignancy).   Flexible sigmoidoscopy on 11/21/2018 revealed normal mucosa in the sigmoid colon, in the descending colon and in the transverse colon. There was erythematous mucosa in the rectum treated with argon plasma coagulation (APC).  There were non-bleeding internal hemorrhoids and no stigmata of recent bleeding.  Capsule study was inconclusive.  There was an extended duration of capsule in the stomach and very limited small bowel images, light brown stool without any evidence of bleeding.  It is unclear if capsule reached cecum.  Symptomatically, she notes vomiting and significant diarrhea after eating regular food today.  She denies any bleeding.  Plan: 1.   Oncology             Treatment for metastatic cervical cancer currently on hold.             Chest CT on 10/10/2018 revealed decrease in size and number of pulmonary nodules. 2.  Hematology  Serial CBCs have documented stability in hemoglobin.  Patient currently off Xarelto. 3.  Gastroenterology  Appreciate gastroenterology consult.  Flexible sigmoidoscopy revealed erythematous mucosa in the rectum treated with APC.  Follow-up biopsies taken during endoscopy.  Capsule study was inconclusive.   Slow transit in stomach.  Patient has a history of colitis  and ulcers in the terminal ileum.  Etiology of diarrhea unclear.   C diff was negative.  Spoke with Dr. Nadara Mode at [facility] (gastroenterologist who had been setup for an appt in 06/2019).   He will try to expedite appointment.  4.  Disposition  Possible transfer to [facility] or [facility] per patient to evaluate GI tract and etiology of long standing diarrhea.   Lequita Asal, MD  11/22/2018, 9:13 AM

## 2018-11-22 NOTE — Progress Notes (Signed)
Joanna Darby, MD [address]  [place]  [place], [place] [address]  Main: [phone]  Fax: [phone] Pager: [phone]   Subjective: Started on regular diet, had emesis after breakfast.  She was able to tolerate lunch well.  She had 3-4 episodes of watery diarrhea in middle of her meals.  Nonbloody.  Awaiting to be transferred to [facility] or [facility]   Objective: Vital signs in last 24 hours: Vitals:   11/21/18 1400 11/21/18 2118 11/22/18 0508 11/22/18 1621  BP: (!) 156/85 133/89 120/85 (!) 124/94  Pulse: 63 79 63 83  Resp: 16 17 16 18   Temp: (!) 97.5 F (36.4 C) 97.7 F (36.5 C) (!) 97.4 F (36.3 C) 98.1 F (36.7 C)  TempSrc: Axillary Oral Oral Oral  SpO2: 100% 100% 98% 99%  Weight:      Height:       Weight change:   Intake/Output Summary (Last 24 hours) at 11/22/2018 1745 Last data filed at 11/22/2018 1421 Gross per 24 hour  Intake 840 ml  Output 2 ml  Net 838 ml     Exam: Heart:: Regular rate and rhythm, S1S2 present or without murmur or extra heart sounds Lungs: normal and clear to auscultation Abdomen: soft, nontender, normal bowel sounds   Lab Results: CBC Latest Ref Rng & Units 11/22/2018 11/21/2018 11/20/2018  WBC 4.0 - 10.5 K/uL 3.9(L) 2.4(L) 2.4(L)  Hemoglobin 12.0 - 15.0 g/dL 10.1(L) 10.2(L) 9.9(L)  Hematocrit 36.0 - 46.0 % 31.2(L) 31.3(L) 30.7(L)  Platelets 150 - 400 K/uL 135(L) 133(L) 124(L)   CMP Latest Ref Rng & Units 11/22/2018 11/21/2018 11/20/2018  Glucose 70 - 99 mg/dL 100(H) 93 -  BUN 6 - 20 mg/dL 8 7 -  Creatinine 0.44 - 1.00 mg/dL 0.69 0.74 -  Sodium 135 - 145 mmol/L 135 135 134(L)  Potassium 3.5 - 5.1 mmol/L 3.8 3.9 3.7  Chloride 98 - 111 mmol/L 104 103 -  CO2 22 - 32 mmol/L 28 28 -  Calcium 8.9 - 10.3 mg/dL 8.4(L) 8.6(L) -  Total Protein 6.5 - 8.1 g/dL - - -  Total Bilirubin 0.3 - 1.2 mg/dL - - -  Alkaline Phos 38 - 126 U/L - - -  AST 15 - 41 U/L - - -  ALT 0 - 44 U/L - - -   Micro Results: Recent Results (from the past 240  hour(s))  C difficile quick scan w PCR reflex     Status: None   Collection Time: 11/20/18 10:29 AM  Result Value Ref Range Status   C Diff antigen NEGATIVE NEGATIVE Final   C Diff toxin NEGATIVE NEGATIVE Final   C Diff interpretation No C. difficile detected.  Final    Comment: Performed at [facility], [address] [place], [place] [address]   Studies/Results: Dg Abd 1 View  Result Date: 11/21/2018 CLINICAL DATA:  Diarrhea. EXAM: ABDOMEN - 1 VIEW COMPARISON:  10/18/2018. FINDINGS: Normal bowel gas pattern. Stable right pelvic phleboliths, cholecystectomy clips and right mid abdominal surgical staples. Interval 1.4 cm metallic object projected over the right mid abdomen to the right of the lumbar spine. Extensive lumbar spine degenerative changes and mild lower thoracic spine degenerative changes. Mild to moderate levoconvex lumbar scoliosis. IMPRESSION: 1. Interval 1.4 cm metallic object projected over the right mid abdomen. This could represent an ingested object such as a small bowel camera. This could also be external to the patient. 2. No acute abnormality. Electronically Signed   By: Remo Lipps  Joneen Caraway M.D.   On: 11/21/2018 18:39   Medications:  I have reviewed the patient's current medications. Prior to Admission:  Medications Prior to Admission  Medication Sig Dispense Refill Last Dose  . amitriptyline (ELAVIL) 75 MG tablet Take 1 tablet (75 mg total) by mouth at bedtime. 90 tablet 1 11/18/2018 at 2100  . budesonide (ENTOCORT EC) 3 MG 24 hr capsule Take 3 capsules (9 mg total) by mouth daily. 30 capsule 0 11/18/2018 at 0900  . Calcium Carb-Cholecalciferol (CALCIUM 500 +D) 500-400 MG-UNIT TABS Take 2 tablets by mouth daily.   11/18/2018 at 0900  . diphenoxylate-atropine (LOMOTIL) 2.5-0.025 MG tablet Take 2 tablets by mouth 4 (four) times daily. 30 tablet 0 11/18/2018 at 2100  . hydrocortisone (CORTEF) 10 MG tablet Take 1 tablet (10 mg total) by mouth 2 (two) times daily. 180 tablet 0  11/18/2018 at 2100  . hyoscyamine (LEVBID) 0.375 MG 12 hr tablet Take 1 tablet (0.375 mg total) by mouth 2 (two) times daily. 60 tablet 0 11/18/2018 at 2100  . Multiple Vitamins-Minerals (MULTIVITAMIN WITH MINERALS) tablet Take 1 tablet by mouth daily. 30 tablet 0 11/18/2018 at 0900  . octreotide (SANDOSTATIN LAR DEPOT) 20 MG injection Inject 20 mg into the muscle every 28 (twenty-eight) days. 1 each 0 Past Month at Unknown time  . ondansetron (ZOFRAN) 8 MG tablet Take 1 tablet (8 mg total) by mouth every 8 (eight) hours as needed for nausea or vomiting. 30 tablet 1 prn at prn  . oxyCODONE (OXY IR/ROXICODONE) 5 MG immediate release tablet Take 1 tablet (5 mg total) by mouth every 6 (six) hours as needed for moderate pain or severe pain. 30 tablet 0 11/18/2018 at 2100  . pantoprazole (PROTONIX) 40 MG tablet Take 1 tablet (40 mg total) by mouth daily. 90 tablet 0 11/18/2018 at 0900  . potassium chloride SA (K-DUR,KLOR-CON) 20 MEQ tablet Take 1 tablet (20 mEq total) by mouth daily. for 4 days. 30 tablet 0 11/18/2018 at 0900  . VIBERZI 100 MG TABS TAKE 1 TABLET BY MOUTH TWICE DAILY WITH  A  MEAL 60 tablet 0 11/18/2018 at 2100  . promethazine (PHENERGAN) 25 MG tablet Take 1 tablet (25 mg total) by mouth every 6 (six) hours as needed for nausea or vomiting. (Patient not taking: Reported on 11/19/2018) 30 tablet 0 Not Taking  . VENTOLIN HFA 108 (90 Base) MCG/ACT inhaler Inhale 1-2 puffs into the lungs every 4 (four) hours as needed for shortness of breath.   0 prn at prn   Scheduled: . amitriptyline  75 mg Oral QHS  . diphenoxylate-atropine  2 tablet Oral QID  . feeding supplement (ENSURE ENLIVE)  237 mL Oral BID BM  . hydrocortisone  10 mg Oral BID   Continuous:  UVO:[id number], famotidine, morphine injection, ondansetron **OR** ondansetron (ZOFRAN) IV, oxyCODONE, promethazine Anti-infectives (From admission, onward)   None     Scheduled Meds: . amitriptyline  75 mg Oral QHS  . diphenoxylate-atropine  2 tablet  Oral QID  . feeding supplement (ENSURE ENLIVE)  237 mL Oral BID BM  . hydrocortisone  10 mg Oral BID   Continuous Infusions: PRN Meds:.albuterol, famotidine, morphine injection, ondansetron **OR** ondansetron (ZOFRAN) IV, oxyCODONE, promethazine  DIAGNOSIS:  A. COLON, RANDOM; COLD BIOPSY:  - COLONIC MUCOSA WITH FOCAL MILD ACTIVE COLITIS.  - FOCAL ISCHEMIC CHANGE.  - NEGATIVE FOR DYSPLASIA AND MALIGNANCY.  - TRICHROME AND IHC FOR CMV AND HSV WILL BE REPORTED IN AN ADDENDUM.   B. RECTAL ERYTHEMA; COLD BIOPSY:  -  COLONIC MUCOSA WITH FOCAL ISCHEMIC CHANGE AND CHANGES CONSISTENT WITH  PRIOR RADIATION.  - NEGATIVE FOR DYSPLASIA AND MALIGNANCY.  - CONGO RED AND IHC FOR CMV AND HSV WILL BE REPORTED IN AN ADDENDUM.  Assessment: Active Problems:   GI bleed  Joanna Hall is a 53 y.o. female with metastatic cervical cancer, status post chemo and radiation complicated by radiation stricture, small bowel obstruction status post resection and ileocolic anastomosis, chronic unexplained diarrhea, hemorrhoids, radiation proctitis, admitted with rectal bleeding and exacerbation of her chronic diarrhea  Plan: Rectal bleeding Underwent flexible sigmoidoscopy yesterday which revealed radiation proctitis, status post APC.  Patient did not have rectal bleeding/hematochezia since admission.  Hemoglobin had been stable APC as needed for radiation proctitis Resume anticoagulation from GI standpoint  Chronic diarrhea, patient has history of ileal colonic anastomosis but majority of her colon and ileum are intact She had an extensive work-up to date which was fairly unremarkable except for some nonspecific features in the small bowel, few aphthae in the terminal ileum.  Patient had random colon biopsies which revealed mild active colitis with focal ischemic change.  No evidence of chronicity.  These features are not suggestive of inflammatory bowel disease rather could be secondary to history of radiation  treatment even temporarily in the past. I ordered stool osmolality however this was not processed due to reported solid stool sample.  However, patient reports that her stools have always been watery. Stool electrolytes are in process Started regular diet today, has not been on any antidiarrheal medication since admission.  She has been receiving only hydrocortisone 10 mg twice daily and amitriptyline 75 mg daily Will start Lomotil 2 tabs 4 times a day Monitor stool output I personally think she will benefit from GI evaluation at a tertiary center, awaiting for bed  Dr. Bonna Gains to cover for the weekend      LOS: 3 days   Shyloh Derosa 11/22/2018, 5:45 PM

## 2018-11-22 NOTE — Progress Notes (Signed)
[place] at [place] NAME: Kamee Bobst    MR#:  [id number]  DATE OF BIRTH:  03-25-1966  SUBJECTIVE:   Patient states she is doing fine this morning.  She still endorses some lower abdominal "cramping".  She states that the cramping improves after having a bowel movement.  She is continues to have episodes of watery and bloody diarrhea.  She has had 3-4 stools today.  REVIEW OF SYSTEMS:  Review of Systems  Constitutional: Positive for malaise/fatigue. Negative for chills and fever.  HENT: Negative for congestion and sore throat.   Eyes: Negative for blurred vision and double vision.  Respiratory: Negative for cough and shortness of breath.   Cardiovascular: Negative for chest pain and palpitations.  Gastrointestinal: Positive for abdominal pain, blood in stool and diarrhea. Negative for melena, nausea and vomiting.  Genitourinary: Negative for dysuria, hematuria and urgency.  Musculoskeletal: Negative for back pain and neck pain.  Neurological: Positive for weakness. Negative for dizziness, focal weakness and headaches.  Psychiatric/Behavioral: Negative for depression. The patient is not nervous/anxious.     DRUG ALLERGIES:   Allergies  Allergen Reactions  . Ketamine Anxiety and Other (See Comments)    Syncope episode/confusion    VITALS:  Blood pressure 120/85, pulse 63, temperature (!) 97.4 F (36.3 C), temperature source Oral, resp. rate 16, height 5' 8"  (1.727 m), weight 80.7 kg, SpO2 98 %. PHYSICAL EXAMINATION:  Physical Exam  GENERAL:  53 y.o.-year-old patient lying in the bed with no acute distress.  Chronically ill-appearing. EYES: Pupils equal, round, reactive to light and accommodation. No scleral icterus. Extraocular muscles intact.  HEENT: Head atraumatic, normocephalic. Oropharynx and nasopharynx clear.  NECK:  Supple, no jugular venous distention. No thyroid enlargement, no tenderness.  LUNGS: Normal breath  sounds bilaterally, no wheezing, rales,rhonchi or crepitation. No use of accessory muscles of respiration.  CARDIOVASCULAR: RRR, S1, S2 normal. No murmurs, rubs, or gallops.  ABDOMEN: Soft, + generalized tenderness across the lower abdomen, no rebound or guarding, nondistended. Bowel sounds present.  EXTREMITIES: No pedal edema, cyanosis, or clubbing.  NEUROLOGIC: CN 2-12 grossly intact, +global weakness. Sensation intact. Gait not checked.  PSYCHIATRIC: The patient is alert and oriented x 3.  SKIN: No obvious rash, lesion, or ulcer.  LABORATORY PANEL:  Female CBC Recent Labs  Lab 11/22/18 0528  WBC 3.9*  HGB 10.1*  HCT 31.2*  PLT 135*   ------------------------------------------------------------------------------------------------------------------ Chemistries  Recent Labs  Lab 11/20/18 0515  11/21/18 0624 11/22/18 0528  NA 136   < > 135 135  K 4.3   < > 3.9 3.8  CL 106  --  103 104  CO2 27  --  28 28  GLUCOSE 94  --  93 100*  BUN 9  --  7 8  CREATININE 0.73  --  0.74 0.69  CALCIUM 8.5*  --  8.6* 8.4*  MG 1.7   < > 1.6*  --   AST 37  --   --   --   ALT 44  --   --   --   ALKPHOS 114  --   --   --   BILITOT 0.3  --   --   --    < > = values in this interval not displayed.   RADIOLOGY:  Dg Abd 1 View  Result Date: 11/21/2018 CLINICAL DATA:  Diarrhea. EXAM: ABDOMEN - 1 VIEW COMPARISON:  10/18/2018. FINDINGS: Normal bowel gas pattern. Stable right pelvic  phleboliths, cholecystectomy clips and right mid abdominal surgical staples. Interval 1.4 cm metallic object projected over the right mid abdomen to the right of the lumbar spine. Extensive lumbar spine degenerative changes and mild lower thoracic spine degenerative changes. Mild to moderate levoconvex lumbar scoliosis. IMPRESSION: 1. Interval 1.4 cm metallic object projected over the right mid abdomen. This could represent an ingested object such as a small bowel camera. This could also be external to the patient. 2. No acute  abnormality. Electronically Signed   By: Claudie Revering M.D.   On: 11/21/2018 18:39   ASSESSMENT AND PLAN:   Acute lower GI bleed with history of multiple GI ulcers in the past- hemoglobin has been stable. -s/p flex sig on 2/6-mostly unremarkable except for erythematous mucosa in the rectum treated with argon plasma coagulation.  Video capsule study inconclusive. -GI following- recommend transfer to tertiary care center -Continue PPI -Xarelto discontinued, last dose was on 11/17/2018  Stage IV cervical cancer on chemotherapy -Oncology following  Hypomagnesemia- mag remains low -Replete and recheck  Acute on chronic diarrhea- improved -C. Diff negative  Chronic pancytopenia- likely related to chemo -Oncology following  History of hepatitis C -Needs to follow-up with gastroenterology at [facility]  ?  Adrenal insufficiency- cortisol level has been low as an outpatient -Continue home hydrocortisone -Needs to follow-up with endocrinology as an outpatient  Plan is for transfer to tertiary care center for further GI work-up.  Unfortunately, there are long wait lists at [facility].  May need to discharge home in the meantime and have patient follow-up with [facility] gastroenterology as an outpatient.  All the records are reviewed and case discussed with Care Management/Social Worker. Management plans discussed with the patient, family and they are in agreement.  CODE STATUS: Full Code  TOTAL TIME TAKING CARE OF THIS PATIENT: 35 minutes.   More than 50% of the time was spent in counseling/coordination of care: YES  POSSIBLE D/C IN 1-2 DAYS, DEPENDING ON CLINICAL CONDITION.   Berna Spare Deepak Bless M.D on 11/22/2018 at 4:11 PM  Between 7am to 6pm - Pager [phone]  After 6pm go to [email or web address] - Proofreader  [facility]  Office  [phone]  CC: Primary care physician; Arnetha Courser, MD  Note: This dictation was prepared with Dragon dictation along  with smaller phrase technology. Any transcriptional errors that result from this process are unintentional.

## 2018-11-23 LAB — BASIC METABOLIC PANEL
Anion gap: 8 (ref 5–15)
BUN: 12 mg/dL (ref 6–20)
CO2: 24 mmol/L (ref 22–32)
Calcium: 8.4 mg/dL — ABNORMAL LOW (ref 8.9–10.3)
Chloride: 103 mmol/L (ref 98–111)
Creatinine, Ser: 0.67 mg/dL (ref 0.44–1.00)
GFR calc Af Amer: >60 mL/min (ref >60)
Glucose, Bld: 99 mg/dL (ref 70–99)
POTASSIUM: 4 mmol/L (ref 3.5–5.1)
Sodium: 135 mmol/L (ref 135–145)

## 2018-11-23 LAB — CBC
HCT: 30.7 % — ABNORMAL LOW (ref 36.0–46.0)
Hemoglobin: 9.9 g/dL — ABNORMAL LOW (ref 12.0–15.0)
MCH: 35 pg — ABNORMAL HIGH (ref 26.0–34.0)
MCHC: 32.2 g/dL (ref 30.0–36.0)
MCV: 108.5 fL — ABNORMAL HIGH (ref 80.0–100.0)
Platelets: 131 10*3/uL — ABNORMAL LOW (ref 150–400)
RBC: 2.83 MIL/uL — ABNORMAL LOW (ref 3.87–5.11)
RDW: 14 % (ref 11.5–15.5)
WBC: 2.9 10*3/uL — AB (ref 4.0–10.5)
nRBC: 0 % (ref 0.0–0.2)

## 2018-11-23 LAB — MAGNESIUM: Magnesium: 1.5 mg/dL — ABNORMAL LOW (ref 1.7–2.4)

## 2018-11-23 NOTE — Progress Notes (Signed)
[place] at [place] NAME: Joanna Hall    MR#:  [id number]  DATE OF BIRTH:  03-01-1966  SUBJECTIVE:   Patient states she is doing fine this morning.  Diarrhea frequency is somewhat better but still having frequent BMs consistency of the stool is changing from watery to mushy ,she still endorses some right lower abdominal "cramping".    She has had 4 stools today.  Wants to be transferred to [facility].  REVIEW OF SYSTEMS:  Review of Systems  Constitutional: Positive for malaise/fatigue. Negative for chills and fever.  HENT: Negative for congestion and sore throat.   Eyes: Negative for blurred vision and double vision.  Respiratory: Negative for cough and shortness of breath.   Cardiovascular: Negative for chest pain and palpitations.  Gastrointestinal: Positive for abdominal pain, blood in stool and diarrhea. Negative for melena, nausea and vomiting.  Genitourinary: Negative for dysuria, hematuria and urgency.  Musculoskeletal: Negative for back pain and neck pain.  Neurological: Positive for weakness. Negative for dizziness, focal weakness and headaches.  Psychiatric/Behavioral: Negative for depression. The patient is not nervous/anxious.     DRUG ALLERGIES:   Allergies  Allergen Reactions  . Ketamine Anxiety and Other (See Comments)    Syncope episode/confusion    VITALS:  Blood pressure 108/66, pulse 63, temperature 97.6 F (36.4 C), temperature source Oral, resp. rate 15, height 5' 8"  (1.727 m), weight 80.7 kg, SpO2 96 %. PHYSICAL EXAMINATION:  Physical Exam  GENERAL:  53 y.o.-year-old patient lying in the bed with no acute distress.  Chronically ill-appearing. EYES: Pupils equal, round, reactive to light and accommodation. No scleral icterus. Extraocular muscles intact.  HEENT: Head atraumatic, normocephalic. Oropharynx and nasopharynx clear.  NECK:  Supple, no jugular venous distention. No thyroid enlargement, no tenderness.    LUNGS: Normal breath sounds bilaterally, no wheezing, rales,rhonchi or crepitation. No use of accessory muscles of respiration.  CARDIOVASCULAR: RRR, S1, S2 normal. No murmurs, rubs, or gallops.  ABDOMEN: Soft, + generalized tenderness across the lower abdomen, no rebound or guarding, nondistended. Bowel sounds present.  EXTREMITIES: No pedal edema, cyanosis, or clubbing.  NEUROLOGIC: CN 2-12 grossly intact, +global weakness. Sensation intact. Gait not checked.  PSYCHIATRIC: The patient is alert and oriented x 3.  SKIN: No obvious rash, lesion, or ulcer.  LABORATORY PANEL:  Female CBC Recent Labs  Lab 11/23/18 0549  WBC 2.9*  HGB 9.9*  HCT 30.7*  PLT 131*   ------------------------------------------------------------------------------------------------------------------ Chemistries  Recent Labs  Lab 11/20/18 0515  11/23/18 0549  NA 136   < > 135  K 4.3   < > 4.0  CL 106   < > 103  CO2 27   < > 24  GLUCOSE 94   < > 99  BUN 9   < > 12  CREATININE 0.73   < > 0.67  CALCIUM 8.5*   < > 8.4*  MG 1.7   < > 1.5*  AST 37  --   --   ALT 44  --   --   ALKPHOS 114  --   --   BILITOT 0.3  --   --    < > = values in this interval not displayed.   RADIOLOGY:  No results found. ASSESSMENT AND PLAN:   Acute lower GI bleed with history of multiple GI ulcers in the past- hemoglobin has been stable. -s/p flex sig on 2/6-mostly unremarkable except for erythematous mucosa in the rectum treated with argon  plasma coagulation.  Video capsule study inconclusive. -GI following- recommend transfer to tertiary care center-on waiting list at [place] discontinued, last dose was on 11/17/2018 -Monitor daily H&H  Stage IV cervical cancer on chemotherapy -Oncology following.  Patient to follow-up with Dr. Mike Gip as an outpatient after discharge  Hypomagnesemia- mag remains low -Replete and recheck  Acute on chronic diarrhea- improved -C. Diff  negative  Chronic pancytopenia- likely related to chemo -Oncology following  History of hepatitis C -Needs to follow-up with gastroenterology at [facility]  ?  Adrenal insufficiency- cortisol level has been low as an outpatient -Continue home hydrocortisone -Needs to follow-up with endocrinology as an outpatient  Plan is for transfer to tertiary care center for further GI work-up.  Unfortunately, there are long wait lists at [facility].  Patient still prefers getting transferred to [facility] or [facility]   All the records are reviewed and case discussed with Care Management/Social Worker. Management plans discussed with the patient, family and they are in agreement.  CODE STATUS: Full Code  TOTAL TIME TAKING CARE OF THIS PATIENT: 35 minutes.   More than 50% of the time was spent in counseling/coordination of care: YES  POSSIBLE D/C IN 1-2 DAYS, DEPENDING ON CLINICAL CONDITION.   Nicholes Mango M.D on 11/23/2018 at 3:16 PM  Between 7am to 6pm - Pager - [phone]  After 6pm go to [email or web address] - Proofreader  [facility]  Office  [phone]  CC: Primary care physician; Arnetha Courser, MD  Note: This dictation was prepared with Dragon dictation along with smaller phrase technology. Any transcriptional errors that result from this process are unintentional.

## 2018-11-23 NOTE — Plan of Care (Signed)

## 2018-11-23 NOTE — Progress Notes (Signed)
   Vonda Antigua, MD [address] [place], [place], [place], [address] [address], [place], [place], [place], [address] Phone: [phone]  Fax: [phone]   Subjective: Denies any further hematochezia.  Reports 1 loose bowel movement today.  3-4 loose bowel meds yesterday.  Feeling better today.   Objective: Exam: Vital signs in last 24 hours: Vitals:   11/22/18 1621 11/22/18 2052 11/23/18 0424 11/23/18 1554  BP: (!) 124/94 92/62 108/66 112/75  Pulse: 83 82 63 81  Resp: 18 17 15 16   Temp: 98.1 F (36.7 C) 98.4 F (36.9 C) 97.6 F (36.4 C) 98.2 F (36.8 C)  TempSrc: Oral Oral Oral Oral  SpO2: 99% 97% 96% 100%  Weight:      Height:       Weight change:   Intake/Output Summary (Last 24 hours) at 11/23/2018 1900 Last data filed at 11/23/2018 1433 Gross per 24 hour  Intake 840 ml  Output -  Net 840 ml    General: No acute distress, AAO x3 Abd: Soft, NT/ND, No HSM Skin: Warm, no rashes Neck: Supple, Trachea midline   Lab Results: Lab Results  Component Value Date   WBC 2.9 (L) 11/23/2018   HGB 9.9 (L) 11/23/2018   HCT 30.7 (L) 11/23/2018   MCV 108.5 (H) 11/23/2018   PLT 131 (L) 11/23/2018   Micro Results: Recent Results (from the past 240 hour(s))  C difficile quick scan w PCR reflex     Status: None   Collection Time: 11/20/18 10:29 AM  Result Value Ref Range Status   C Diff antigen NEGATIVE NEGATIVE Final   C Diff toxin NEGATIVE NEGATIVE Final   C Diff interpretation No C. difficile detected.  Final    Comment: Performed at [facility], [address] [place],  [address]   Studies/Results: No results found. Medications:  Scheduled Meds: . amitriptyline  75 mg Oral QHS  . diphenoxylate-atropine  2 tablet Oral QID  . feeding supplement (ENSURE ENLIVE)  237 mL Oral BID BM  . hydrocortisone  10 mg Oral BID   Continuous Infusions: PRN Meds:.albuterol, famotidine, morphine injection, ondansetron **OR** ondansetron (ZOFRAN)  IV, oxyCODONE, promethazine   Assessment: Active Problems:   GI bleed    Plan: No further episodes of GI bleeding Patient is status post APC for radiation proctitis She is awaiting transfer to [facility]  Diarrhea has improved as of today  Follow-up closely with GI on discharge as well   LOS: 4 days   Vonda Antigua, MD 11/23/2018, 7:00 PM

## 2018-11-24 LAB — CBC
HCT: 31.3 % — ABNORMAL LOW (ref 36.0–46.0)
Hemoglobin: 10.3 g/dL — ABNORMAL LOW (ref 12.0–15.0)
MCH: 34.8 pg — ABNORMAL HIGH (ref 26.0–34.0)
MCHC: 32.9 g/dL (ref 30.0–36.0)
MCV: 105.7 fL — ABNORMAL HIGH (ref 80.0–100.0)
Platelets: 141 10*3/uL — ABNORMAL LOW (ref 150–400)
RBC: 2.96 MIL/uL — ABNORMAL LOW (ref 3.87–5.11)
RDW: 13.7 % (ref 11.5–15.5)
WBC: 3.2 10*3/uL — ABNORMAL LOW (ref 4.0–10.5)
nRBC: 0 % (ref 0.0–0.2)

## 2018-11-24 NOTE — Progress Notes (Signed)
[place] at [place] NAME: Joanna Hall    MR#:  [id number]  DATE OF BIRTH:  November 10, 1965  SUBJECTIVE:   Patient states she is doing fine this morning.  Patient had 3 semi-formed stool with no blood in the past 24 hours, awaiting to be transferred to [facility].  REVIEW OF SYSTEMS:  Review of Systems  Constitutional: Positive for malaise/fatigue. Negative for chills and fever.  HENT: Negative for congestion and sore throat.   Eyes: Negative for blurred vision and double vision.  Respiratory: Negative for cough and shortness of breath.   Cardiovascular: Negative for chest pain and palpitations.  Gastrointestinal: Positive for abdominal pain, blood in stool and diarrhea. Negative for melena, nausea and vomiting.  Genitourinary: Negative for dysuria, hematuria and urgency.  Musculoskeletal: Negative for back pain and neck pain.  Neurological: Positive for weakness. Negative for dizziness, focal weakness and headaches.  Psychiatric/Behavioral: Negative for depression. The patient is not nervous/anxious.     DRUG ALLERGIES:   Allergies  Allergen Reactions  . Ketamine Anxiety and Other (See Comments)    Syncope episode/confusion    VITALS:  Blood pressure 123/82, pulse 67, temperature (!) 97.5 F (36.4 C), temperature source Oral, resp. rate 18, height 5' 8"  (1.727 m), weight 80.7 kg, SpO2 100 %. PHYSICAL EXAMINATION:  Physical Exam  GENERAL:  53 y.o.-year-old patient lying in the bed with no acute distress.  Chronically ill-appearing. EYES: Pupils equal, round, reactive to light and accommodation. No scleral icterus. Extraocular muscles intact.  HEENT: Head atraumatic, normocephalic. Oropharynx and nasopharynx clear.  NECK:  Supple, no jugular venous distention. No thyroid enlargement, no tenderness.  LUNGS: Normal breath sounds bilaterally, no wheezing, rales,rhonchi or crepitation. No use of accessory muscles of respiration.    CARDIOVASCULAR: RRR, S1, S2 normal. No murmurs, rubs, or gallops.  ABDOMEN: Soft, + generalized tenderness across the lower abdomen, no rebound or guarding, nondistended. Bowel sounds present.  EXTREMITIES: No pedal edema, cyanosis, or clubbing.  NEUROLOGIC: CN 2-12 grossly intact, +global weakness. Sensation intact. Gait not checked.  PSYCHIATRIC: The patient is alert and oriented x 3.  SKIN: No obvious rash, lesion, or ulcer.  LABORATORY PANEL:  Female CBC Recent Labs  Lab 11/24/18 0528  WBC 3.2*  HGB 10.3*  HCT 31.3*  PLT 141*   ------------------------------------------------------------------------------------------------------------------ Chemistries  Recent Labs  Lab 11/20/18 0515  11/23/18 0549  NA 136   < > 135  K 4.3   < > 4.0  CL 106   < > 103  CO2 27   < > 24  GLUCOSE 94   < > 99  BUN 9   < > 12  CREATININE 0.73   < > 0.67  CALCIUM 8.5*   < > 8.4*  MG 1.7   < > 1.5*  AST 37  --   --   ALT 44  --   --   ALKPHOS 114  --   --   BILITOT 0.3  --   --    < > = values in this interval not displayed.   RADIOLOGY:  No results found. ASSESSMENT AND PLAN:   Acute lower GI bleed with history of multiple GI ulcers in the past- hemoglobin has been stable. Clinically improving -s/p flex sig on 2/6-mostly unremarkable except for erythematous mucosa in the rectum treated with argon plasma coagulation.  Video capsule study inconclusive. -GI following- recommend transfer to tertiary care center-on waiting list at [facility]  PPI -Xarelto discontinued, last dose was on 11/17/2018 -Monitor daily H&H, hemoglobin 9.9-10.3  Stage IV cervical cancer on chemotherapy -Oncology following.  Patient to follow-up with Dr. Mike Gip as an outpatient after discharge  Hypomagnesemia- mag remains low -Replete and recheck  Acute on chronic diarrhea- improved -C. Diff negative  Chronic pancytopenia- likely related to chemo -Oncology following  History of hepatitis  C -Needs to follow-up with gastroenterology at [facility]  ?  Adrenal insufficiency- cortisol level has been low as an outpatient -Continue home hydrocortisone -Needs to follow-up with endocrinology as an outpatient  Anticipating to transfer the patient to [facility] or if clinically better will discharge patient home tomorrow patient wants to get Dr. Kem Parkinson opinion in a.m.  Plan is for transfer to tertiary care center for further GI work-up.  Unfortunately, there are long wait lists at [facility].  Patient still prefers getting transferred to [facility] or [facility]   All the records are reviewed and case discussed with Care Management/Social Worker. Management plans discussed with the patient, family and they are in agreement.  CODE STATUS: Full Code  TOTAL TIME TAKING CARE OF THIS PATIENT: 32 minutes.   More than 50% of the time was spent in counseling/coordination of care: YES  POSSIBLE D/C IN 1-2 DAYS, DEPENDING ON CLINICAL CONDITION.   Nicholes Mango M.D on 11/24/2018 at 1:18 PM  Between 7am to 6pm - Pager - [phone]  After 6pm go to [email or web address] - Proofreader  [facility]  Office  [phone]  CC: Primary care physician; Arnetha Courser, MD  Note: This dictation was prepared with Dragon dictation along with smaller phrase technology. Any transcriptional errors that result from this process are unintentional.

## 2018-11-25 ENCOUNTER — Encounter: Payer: Self-pay | Admitting: Gastroenterology

## 2018-11-25 ENCOUNTER — Inpatient Hospital Stay: Payer: Medicaid Other

## 2018-11-25 ENCOUNTER — Encounter: Payer: Self-pay | Admitting: Hematology and Oncology

## 2018-11-25 ENCOUNTER — Ambulatory Visit: Payer: Medicaid Other

## 2018-11-25 LAB — MAGNESIUM: Magnesium: 1.4 mg/dL — ABNORMAL LOW (ref 1.7–2.4)

## 2018-11-25 LAB — CBC
HCT: 32.3 % — ABNORMAL LOW (ref 36.0–46.0)
Hemoglobin: 10.8 g/dL — ABNORMAL LOW (ref 12.0–15.0)
MCH: 35.4 pg — ABNORMAL HIGH (ref 26.0–34.0)
MCHC: 33.4 g/dL (ref 30.0–36.0)
MCV: 105.9 fL — ABNORMAL HIGH (ref 80.0–100.0)
Platelets: 148 10*3/uL — ABNORMAL LOW (ref 150–400)
RBC: 3.05 MIL/uL — ABNORMAL LOW (ref 3.87–5.11)
RDW: 13.7 % (ref 11.5–15.5)
WBC: 3.2 10*3/uL — ABNORMAL LOW (ref 4.0–10.5)
nRBC: 0 % (ref 0.0–0.2)

## 2018-11-25 LAB — SURGICAL PATHOLOGY

## 2018-11-25 MED ORDER — MORPHINE SULFATE (PF) 2 MG/ML IV SOLN: 2.0000 mg | INTRAVENOUS | 0 refills | Status: DC | PRN

## 2018-11-25 MED ORDER — SODIUM CHLORIDE 0.9 % IV SOLN: 75.0000 mL | INTRAVENOUS | 0 refills | Status: DC

## 2018-11-25 MED ORDER — POLYETHYLENE GLYCOL 3350 17 G PO PACK
17.0000 g | PACK | Freq: Two times a day (BID) | ORAL | Status: DC
  Administered 2018-11-26: 17 g via ORAL
  Filled 2018-11-25 (×2): qty 1

## 2018-11-25 MED ORDER — MAGNESIUM SULFATE 50 % IJ SOLN
3.0000 g | Freq: Once | INTRAVENOUS | Status: AC
  Administered 2018-11-25: 15:00:00 3 g via INTRAVENOUS
  Filled 2018-11-25: qty 6

## 2018-11-25 MED ORDER — HYDROCORTISONE ACETATE 25 MG RE SUPP
25.0000 mg | Freq: Two times a day (BID) | RECTAL | Status: DC
  Administered 2018-11-25 – 2018-11-26 (×3): 25 mg via RECTAL
  Filled 2018-11-25 (×4): qty 1

## 2018-11-25 MED ORDER — SODIUM CHLORIDE 0.9 % IV SOLN
INTRAVENOUS | Status: DC
  Administered 2018-11-25 – 2018-11-26 (×2): via INTRAVENOUS

## 2018-11-25 MED ORDER — ONDANSETRON HCL 4 MG PO TABS: 4.0000 mg | ORAL_TABLET | Freq: Four times a day (QID) | ORAL | 0 refills | Status: DC | PRN

## 2018-11-25 MED ORDER — ONDANSETRON HCL 4 MG/2ML IJ SOLN: 4.0000 mg | Freq: Four times a day (QID) | INTRAMUSCULAR | 0 refills | Status: DC | PRN

## 2018-11-25 MED ORDER — FAMOTIDINE 20 MG PO TABS: 20.0000 mg | ORAL_TABLET | Freq: Two times a day (BID) | ORAL | Status: DC | PRN

## 2018-11-25 MED ORDER — POLYETHYLENE GLYCOL 3350 17 G PO PACK: 17.0000 g | PACK | Freq: Every day | ORAL | 0 refills | Status: DC | PRN

## 2018-11-25 MED ORDER — HYDROCORTISONE ACETATE 25 MG RE SUPP: 25.0000 mg | Freq: Two times a day (BID) | RECTAL | 0 refills | Status: DC

## 2018-11-25 MED ORDER — PROMETHAZINE HCL 25 MG/ML IJ SOLN: 12.5000 mg | Freq: Four times a day (QID) | INTRAMUSCULAR | 0 refills | Status: DC | PRN

## 2018-11-25 NOTE — Progress Notes (Signed)
   11/25/18 1400  Clinical Encounter Type  Visited With Patient  Visit Type Initial  Spiritual Encounters  Spiritual Needs Prayer  Stress Factors  Patient Stress Factors Health changes  Ch was rounding. Pt was in physical discomfort and asked for a prayer. Ch prayed with her.

## 2018-11-25 NOTE — Discharge Summary (Signed)
[place] at [place] NAME: Joanna Hall    MR#:  [id number]  DATE OF BIRTH:  [birth year]  DATE OF ADMISSION:  11/19/2018 ADMITTING PHYSICIAN: Nicholes Mango, MD  DATE OF DISCHARGE: 11/25/18   PRIMARY CARE PHYSICIAN: Arnetha Courser, MD    ADMISSION DIAGNOSIS:  GI Bleed  DISCHARGE DIAGNOSIS:  Active Problems:   GI bleed   SECONDARY DIAGNOSIS:   Past Medical History:  Diagnosis Date  . Abdominal pain 06/10/2018  . Abnormal cervical Papanicolaou smear 09/18/2017  . Anxiety   . Aortic atherosclerosis ([place])   . Arthritis    neck and knees  . 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 Blood transfusion without reported diagnosis   . Cervical cancer ([place]) 09/2016   mets lung  . Chronic anal fissure   . Chronic diarrhea   . Dyspnea   . Erosive gastropathy 09/18/2017  . Factor V Leiden mutation ([place])   . Fecal incontinence   . Genital warts   . GERD (gastroesophageal reflux disease)   . Heart murmur   . Hematochezia   . Hemorrhoids   . 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 Hepatitis, chronic ([place]) 05/05/2017  . History of cancer chemotherapy    completed 06/2017  . History of Clostridium difficile infection    while undergoing chemo.  Negative test 10/2017  . Ileocolic anastomotic leak   . 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 Intestinal infection due to Clostridium difficile 09/18/2017  . Macrocytic anemia with vitamin B12 deficiency   . Multiple gastric ulcers   . Nausea vomiting and diarrhea   . Pancolitis ([place]) 07/27/2018  . Perianal condylomata   . Pneumonia    History of  . Pulmonary nodules   . Rectal bleeding   . Small bowel obstruction ([place]) 08/2017  . Stiff neck    limited right turn  . Vitamin D deficiency     HOSPITAL COURSE:  HPI  Joanna Hall  is a 53 y.o. female with a known history of GERD, gastric ulcers, stage IV cervical cancer last  chemotherapy on 09/30/2018, history of intermittent episodes of diarrhea which has been progressively getting worse and has been experiencing watery diarrhea 10 times a day, was on Xarelto which was discontinued on Sunday he is seen at Dr. Kem Parkinson office today.  Patient was complaining of dizziness and GI bleed, hemoglobin was at 10.0 and hospitalist team is called and patient was sent to the hospital as a direct admission.  Patient is reporting right lower quadrant abdominal pain radiating to the left side of the lower abdomen associated with dizziness denies any nausea vomiting  Acute lower GI bleed with history of multiple GI ulcers in the past- hemoglobin has been stable. Clinically improving -s/p flex sig on 2/6-mostly unremarkable except for erythematous mucosa in the rectum treated with argon plasma coagulation.   Video capsule study done but capsule got stuck in the right lower quadrant per KUB today -GI following- recommend transfer to tertiary care center-on waiting list at [place] and oncology Dr. Mike Gip recommended to transfer the patient to [facility] as she is clinically not improving.  Patient is agreeable -gastroenterologist Dr. Earlie Counts is recommending to transfer the patient to [facility] as per Patsey Berthold  discussion -Continue PPI -Xarelto discontinued,last dose was on 11/17/2018 -Monitor daily H&H, hemoglobin  9.9-10.3  Nausea and vomiting intractable n.p.o., IV fluids and antiemetics supportive treatment and PPI  Stage IV cervical cancer on chemotherapy -Oncology following.  Patient to follow-up with Dr. Mike Gip as an outpatient after discharge  Hypomagnesemia- mag remains low -Replete and recheck  Acute on chronic diarrhea- improved -C. Diff negative  Chronic pancytopenia- likely related to chemo -Oncology following  History of hepatitis C -Needs to follow-up with gastroenterology at [facility]  ?  Adrenal insufficiency- cortisol level has  been low as an outpatient -Continue home hydrocortisone -Needs to follow-up with endocrinology as an outpatient  Anticipating to transfer the patient to [facility] or if clinically better will discharge patient home tomorrow patient wants to get Dr. Kem Parkinson opinion in a.m.  Plan is for transfer to tertiary care center for further GI work-up DISCHARGE CONDITIONS:   Failure  CONSULTS OBTAINED:  Treatment Team:  [name], MD Lequita Asal, MD Jonathon Bellows, MD   PROCEDURES flex sigmoidoscopy video capsule endoscopy  DRUG ALLERGIES:   Allergies  Allergen Reactions  . Ketamine Anxiety and Other (See Comments)    Syncope episode/confusion     DISCHARGE MEDICATIONS:   Allergies as of 11/25/2018      Reactions   Ketamine Anxiety, Other (See Comments)   Syncope episode/confusion      Medication List    TAKE these medications   amitriptyline 75 MG tablet Commonly known as:  ELAVIL Take 1 tablet (75 mg total) by mouth at bedtime.   budesonide 3 MG 24 hr capsule Commonly known as:  ENTOCORT EC Take 3 capsules (9 mg total) by mouth daily.   CALCIUM 500 +D 500-400 MG-UNIT Tabs Generic drug:  Calcium Carb-Cholecalciferol Take 2 tablets by mouth daily.   diphenoxylate-atropine 2.5-0.025 MG tablet Commonly known as:  LOMOTIL Take 2 tablets by mouth 4 (four) times daily.   famotidine 20 MG tablet Commonly known as:  PEPCID Take 1 tablet (20 mg total) by mouth 2 (two) times daily as needed for heartburn or indigestion.   hydrocortisone 10 MG tablet Commonly known as:  CORTEF Take 1 tablet (10 mg total) by mouth 2 (two) times daily.   hydrocortisone 25 MG suppository Commonly known as:  ANUSOL-HC Place 1 suppository (25 mg total) rectally 2 (two) times daily.   hyoscyamine 0.375 MG 12 hr tablet Commonly known as:  LEVBID Take 1 tablet (0.375 mg total) by mouth 2 (two) times daily.   morphine 2 MG/ML injection Inject 1 mL (2 mg total) into the  vein every 3 (three) hours as needed.   multivitamin with minerals tablet Take 1 tablet by mouth daily.   octreotide 20 MG injection Commonly known as:  SANDOSTATIN LAR DEPOT Inject 20 mg into the muscle every 28 (twenty-eight) days.   ondansetron 8 MG tablet Commonly known as:  ZOFRAN Take 1 tablet (8 mg total) by mouth every 8 (eight) hours as needed for nausea or vomiting. What changed:  Another medication with the same name was added. Make sure you understand how and when to take each.   ondansetron 4 MG tablet Commonly known as:  ZOFRAN Take 1 tablet (4 mg total) by mouth every 6 (six) hours as needed for nausea. What changed:  You were already taking a medication with the same name, and this prescription was added. Make sure you understand how and when to take each.   ondansetron 4 MG/2ML Soln injection Commonly known as:  ZOFRAN Inject 2 mLs (4 mg total) into the vein  every 6 (six) hours as needed for nausea. What changed:  You were already taking a medication with the same name, and this prescription was added. Make sure you understand how and when to take each.   oxyCODONE 5 MG immediate release tablet Commonly known as:  Oxy IR/ROXICODONE Take 1 tablet (5 mg total) by mouth every 6 (six) hours as needed for moderate pain or severe pain.   pantoprazole 40 MG tablet Commonly known as:  PROTONIX Take 1 tablet (40 mg total) by mouth daily.   polyethylene glycol packet Commonly known as:  MIRALAX / GLYCOLAX Take 17 g by mouth daily as needed.   potassium chloride SA 20 MEQ tablet Commonly known as:  K-DUR,KLOR-CON Take 1 tablet (20 mEq total) by mouth daily. for 4 days.   promethazine 25 MG tablet Commonly known as:  PHENERGAN Take 1 tablet (25 mg total) by mouth every 6 (six) hours as needed for nausea or vomiting. What changed:  Another medication with the same name was added. Make sure you understand how and when to take each.   promethazine 25 MG/ML  injection Commonly known as:  PHENERGAN Inject 0.5-1 mLs (12.5-25 mg total) into the vein every 6 (six) hours as needed for nausea or vomiting. What changed:  You were already taking a medication with the same name, and this prescription was added. Make sure you understand how and when to take each.   sodium chloride 0.9 % infusion Inject 75 mLs into the vein continuous.   VENTOLIN HFA 108 (90 Base) MCG/ACT inhaler Generic drug:  albuterol Inhale 1-2 puffs into the lungs every 4 (four) hours as needed for shortness of breath.   VIBERZI 100 MG Tabs Generic drug:  Eluxadoline TAKE 1 TABLET BY MOUTH TWICE DAILY WITH  A  MEAL        DISCHARGE INSTRUCTIONS:   Follow-up with primary care physician after discharge Follow-up with Dr. Mike Gip oncologist in 1 week following discharge  DIET:  npo  DISCHARGE CONDITION:  Fair  ACTIVITY:  Activity as tolerated  OXYGEN:  Home Oxygen: No.   Oxygen Delivery: room air  DISCHARGE LOCATION:  Tertiary care center  If you experience worsening of your admission symptoms, develop shortness of breath, life threatening emergency, suicidal or homicidal thoughts you must seek medical attention immediately by calling 911 or calling your MD immediately  if symptoms less severe.  You Must read complete instructions/literature along with all the possible adverse reactions/side effects for all the Medicines you take and that have been prescribed to you. Take any new Medicines after you have completely understood and accpet all the possible adverse reactions/side effects.   Please note  You were cared for by a hospitalist during your hospital stay. If you have any questions about your discharge medications or the care you received while you were in the hospital after you are discharged, you can call the unit and asked to speak with the hospitalist on call if the hospitalist that took care of you is not available. Once you are discharged, your primary  care physician will handle any further medical issues. Please note that NO REFILLS for any discharge medications will be authorized once you are discharged, as it is imperative that you return to your primary care physician (or establish a relationship with a primary care physician if you do not have one) for your aftercare needs so that they can reassess your need for medications and monitor your lab values.     Today  No chief complaint on file.  Patient is having intractable nausea and vomiting reporting abdominal pain video capsule still got stuck in her stomach  ROS:  CONSTITUTIONAL: Denies fevers, chills. Denies any fatigue, weakness.  EYES: Denies blurry vision, double vision, eye pain. EARS, NOSE, THROAT: Denies tinnitus, ear pain, hearing loss. RESPIRATORY: Denies cough, wheeze, shortness of breath.  CARDIOVASCULAR: Denies chest pain, palpitations, edema.  GASTROINTESTINAL: Reporting nausea, vomiting, right-sided abdominal pain. Denies bright red blood per rectum.  Denies diarrhea today GENITOURINARY: Denies dysuria, hematuria. ENDOCRINE: Denies nocturia or thyroid problems. HEMATOLOGIC AND LYMPHATIC: Denies easy bruising or bleeding. SKIN: Denies rash or lesion. MUSCULOSKELETAL: Denies pain in neck, back, shoulder, knees, hips or arthritic symptoms.  NEUROLOGIC: Denies paralysis, paresthesias.  PSYCHIATRIC: Denies anxiety or depressive symptoms.   VITAL SIGNS:  Blood pressure 115/63, pulse 81, temperature 98 F (36.7 C), temperature source Oral, resp. rate 18, height 5' 8"  (1.727 m), weight 80.7 kg, SpO2 97 %.  I/O:    Intake/Output Summary (Last 24 hours) at 11/25/2018 1622 Last data filed at 11/25/2018 1500 Gross per 24 hour  Intake 391.12 ml  Output -  Net 391.12 ml    PHYSICAL EXAMINATION:  GENERAL:  53 y.o.-year-old patient lying in the bed with no acute distress.  EYES: Pupils equal, round, reactive to light and accommodation. No scleral icterus. Extraocular  muscles intact.  HEENT: Head atraumatic, normocephalic. Oropharynx and nasopharynx clear.  NECK:  Supple, no jugular venous distention. No thyroid enlargement, no tenderness.  LUNGS: Normal breath sounds bilaterally, no wheezing, rales,rhonchi or crepitation. No use of accessory muscles of respiration.  CARDIOVASCULAR: S1, S2 normal. No murmurs, rubs, or gallops.  ABDOMEN: Soft, non-tender, non-distended. Bowel sounds present. No organomegaly or mass.  EXTREMITIES: No pedal edema, cyanosis, or clubbing.  NEUROLOGIC: Cranial nerves II through XII are intact. Muscle strength 5/5 in all extremities. Sensation intact. Gait not checked.  PSYCHIATRIC: The patient is alert and oriented x 3.  SKIN: No obvious rash, lesion, or ulcer.   DATA REVIEW:   CBC Recent Labs  Lab 11/25/18 0542  WBC 3.2*  HGB 10.8*  HCT 32.3*  PLT 148*    Chemistries  Recent Labs  Lab 11/20/18 0515  11/23/18 0549 11/25/18 1204  NA 136   < > 135  --   K 4.3   < > 4.0  --   CL 106   < > 103  --   CO2 27   < > 24  --   GLUCOSE 94   < > 99  --   BUN 9   < > 12  --   CREATININE 0.73   < > 0.67  --   CALCIUM 8.5*   < > 8.4*  --   MG 1.7   < > 1.5* 1.4*  AST 37  --   --   --   ALT 44  --   --   --   ALKPHOS 114  --   --   --   BILITOT 0.3  --   --   --    < > = values in this interval not displayed.    Cardiac Enzymes Recent Labs  Lab 11/20/18 2227  TROPONINI <0.03    Microbiology Results  Results for orders placed or performed during the hospital encounter of 11/19/18  C difficile quick scan w PCR reflex     Status: None   Collection Time: 11/20/18 10:29 AM  Result Value Ref Range Status  C Diff antigen NEGATIVE NEGATIVE Final   C Diff toxin NEGATIVE NEGATIVE Final   C Diff interpretation No C. difficile detected.  Final    Comment: Performed at [facility], [address] [place], [place] [address]    RADIOLOGY:  Bea Graff 1 View  Result Date: 11/25/2018 CLINICAL DATA:   Intermittent episodes of diarrhea, progressively worsening. Dizziness and GI bleed, sent to hospital as a direct admission. RIGHT lower quadrant abdominal pain radiating to LEFT side of lower abdomen. Known history of GERD, gastric ulcers, stage IV cervical cancer with last chemotherapy on 09/30/2018. EXAM: ABDOMEN - 1 VIEW COMPARISON:  None. FINDINGS: Overall bowel gas pattern is nonobstructive. No dilated small bowel loops seen. Fairly large amount of stool throughout the large bowel with associated mild distention of the RIGHT colon/transverse colon. Metallic object again projects over the RIGHT lower abdomen, presumed bowel camera. Cholecystectomy clips in the RIGHT upper quadrant. No evidence of soft tissue mass or abnormal fluid collection. No evidence of free intraperitoneal air. No evidence of renal or ureteral calculi. Degenerative changes within the scoliotic lumbar spine, moderate in degree. No acute appearing osseous abnormality. IMPRESSION: 1. Nonobstructive bowel gas pattern. Fairly large amount of stool throughout the large bowel with associated mild distention of the RIGHT colon/transverse colon. 2. Presumed bowel camera within the RIGHT lower quadrant. Electronically Signed   By: Franki Cabot M.D.   On: 11/25/2018 12:48   Dg Abd 1 View  Result Date: 11/21/2018 CLINICAL DATA:  Diarrhea. EXAM: ABDOMEN - 1 VIEW COMPARISON:  10/18/2018. FINDINGS: Normal bowel gas pattern. Stable right pelvic phleboliths, cholecystectomy clips and right mid abdominal surgical staples. Interval 1.4 cm metallic object projected over the right mid abdomen to the right of the lumbar spine. Extensive lumbar spine degenerative changes and mild lower thoracic spine degenerative changes. Mild to moderate levoconvex lumbar scoliosis. IMPRESSION: 1. Interval 1.4 cm metallic object projected over the right mid abdomen. This could represent an ingested object such as a small bowel camera. This could also be external to the  patient. 2. No acute abnormality. Electronically Signed   By: Claudie Revering M.D.   On: 11/21/2018 18:39    EKG:   Orders placed or performed during the hospital encounter of 11/19/18  . EKG 12-Lead  . EKG 12-Lead      Management plans discussed with the patient, she is in agreement.  CODE STATUS:     Code Status Orders  (From admission, onward)         Start     Ordered   11/19/18 1804  Full code  Continuous     11/19/18 1803        Code Status History    Date Active Date Inactive Code Status Order ID Comments User Context   10/17/2018 1910 10/18/2018 2119 Full Code [id number]  Hillary Bow, MD Inpatient   10/08/2018 1443 10/11/2018 1920 Full Code [id number]  Nicholes Mango, MD Inpatient   07/27/2018 1621 08/01/2018 1432 Full Code [id number]  Demetrios Loll, MD Inpatient   06/10/2018 1754 06/12/2018 1614 Full Code [id number]  Dustin Flock, MD Inpatient   04/24/2018 1451 04/27/2018 1700 Full Code [id number]  Lovell Sheehan, MD Inpatient   12/03/2017 1619 12/08/2017 1606 Full Code [id number]  Epifanio Lesches, MD ED   09/12/2017 0024 09/15/2017 1721 Full Code [id number]  [name], MD ED   08/27/2017 0223 09/05/2017 1355 Full Code [id number]  Herbert Pun, MD Inpatient    Advance Directive Documentation  Most Recent Value  Type of Advance Directive  Healthcare Power of Attorney  Pre-existing out of facility DNR order (yellow form or pink MOST form)  -  "MOST" Form in Place?  -      TOTAL TIME TAKING CARE OF THIS PATIENT: 43  minutes.   Note: This dictation was prepared with Dragon dictation along with smaller phrase technology. Any transcriptional errors that result from this process are unintentional.   @[facility] @  on 11/25/2018 at 4:22 PM  Between 7am to 6pm - Pager - [phone]  After 6pm go to [email or web address] - password [email or web address]  Office  [phone]  CC: Primary care physician; Arnetha Courser, MD

## 2018-11-25 NOTE — Plan of Care (Signed)

## 2018-11-25 NOTE — Progress Notes (Signed)
Pt refused lomotil due to feeling "slightly constipated" she complains of pressure. Pt reports she remains to pass swallow capsule from study.

## 2018-11-25 NOTE — Progress Notes (Signed)
[facility] [place] NAME: Joanna Hall    MR#:  [id number]  DATE OF BIRTH:  [birth year]  SUBJECTIVE:   Patient states she is doing fine this morning.  Eventually she started having right lower quadrant abdominal pain associated with nausea and vomiting, hemorrhoids are hurting   REVIEW OF SYSTEMS:  Review of Systems  Constitutional: Positive for malaise/fatigue. Negative for chills and fever.  HENT: Negative for congestion and sore throat.   Eyes: Negative for blurred vision and double vision.  Respiratory: Negative for cough and shortness of breath.   Cardiovascular: Negative for chest pain and palpitations.  Gastrointestinal: Positive for abdominal pain. Negative for blood in stool, diarrhea, melena, nausea and vomiting.  Genitourinary: Negative for dysuria, hematuria and urgency.  Musculoskeletal: Negative for back pain and neck pain.  Neurological: Positive for weakness. Negative for dizziness, focal weakness and headaches.  Psychiatric/Behavioral: Negative for depression. The patient is not nervous/anxious.     DRUG ALLERGIES:   Allergies  Allergen Reactions  . Ketamine Anxiety and Other (See Comments)    Syncope episode/confusion    VITALS:  Blood pressure 115/63, pulse 81, temperature 98 F (36.7 C), temperature source Oral, resp. rate 18, height 5' 8"  (1.727 m), weight 80.7 kg, SpO2 97 %. PHYSICAL EXAMINATION:  Physical Exam  GENERAL:  53 y.o.-year-old patient lying in the bed with no acute distress.  Chronically ill-appearing. EYES: Pupils equal, round, reactive to light and accommodation. No scleral icterus. Extraocular muscles intact.  HEENT: Head atraumatic, normocephalic. Oropharynx and nasopharynx clear.  NECK:  Supple, no jugular venous distention. No thyroid enlargement, no tenderness.  LUNGS: Normal breath sounds bilaterally, no wheezing, rales,rhonchi or crepitation. No use of accessory muscles of  respiration.  CARDIOVASCULAR: RRR, S1, S2 normal. No murmurs, rubs, or gallops.  ABDOMEN: Soft, + generalized tenderness across the lower abdomen, no rebound or guarding, nondistended. Bowel sounds present.  EXTREMITIES: No pedal edema, cyanosis, or clubbing.  NEUROLOGIC: CN 2-12 grossly intact, +global weakness. Sensation intact. Gait not checked.  PSYCHIATRIC: The patient is alert and oriented x 3.  SKIN: No obvious rash, lesion, or ulcer.  LABORATORY PANEL:  Female CBC Recent Labs  Lab 11/25/18 0542  WBC 3.2*  HGB 10.8*  HCT 32.3*  PLT 148*   ------------------------------------------------------------------------------------------------------------------ Chemistries  Recent Labs  Lab 11/20/18 0515  11/23/18 0549 11/25/18 1204  NA 136   < > 135  --   K 4.3   < > 4.0  --   CL 106   < > 103  --   CO2 27   < > 24  --   GLUCOSE 94   < > 99  --   BUN 9   < > 12  --   CREATININE 0.73   < > 0.67  --   CALCIUM 8.5*   < > 8.4*  --   MG 1.7   < > 1.5* 1.4*  AST 37  --   --   --   ALT 44  --   --   --   ALKPHOS 114  --   --   --   BILITOT 0.3  --   --   --    < > = values in this interval not displayed.   RADIOLOGY:  Dg Abd 1 View  Result Date: 11/25/2018 CLINICAL DATA:  Intermittent episodes of diarrhea, progressively worsening. Dizziness and GI bleed, sent to hospital as a direct admission. RIGHT lower  quadrant abdominal pain radiating to LEFT side of lower abdomen. Known history of GERD, gastric ulcers, stage IV cervical cancer with last chemotherapy on 09/30/2018. EXAM: ABDOMEN - 1 VIEW COMPARISON:  None. FINDINGS: Overall bowel gas pattern is nonobstructive. No dilated small bowel loops seen. Fairly large amount of stool throughout the large bowel with associated mild distention of the RIGHT colon/transverse colon. Metallic object again projects over the RIGHT lower abdomen, presumed bowel camera. Cholecystectomy clips in the RIGHT upper quadrant. No evidence of soft tissue mass  or abnormal fluid collection. No evidence of free intraperitoneal air. No evidence of renal or ureteral calculi. Degenerative changes within the scoliotic lumbar spine, moderate in degree. No acute appearing osseous abnormality. IMPRESSION: 1. Nonobstructive bowel gas pattern. Fairly large amount of stool throughout the large bowel with associated mild distention of the RIGHT colon/transverse colon. 2. Presumed bowel camera within the RIGHT lower quadrant. Electronically Signed   By: Franki Cabot M.D.   On: 11/25/2018 12:48   ASSESSMENT AND PLAN:   Acute lower GI bleed with history of multiple GI ulcers in the past- hemoglobin has been stable. Clinically improving -s/p flex sig on 2/6-mostly unremarkable except for erythematous mucosa in the rectum treated with argon plasma coagulation.   Video capsule study done but capsule got stuck in the right lower quadrant per KUB today -GI following- recommend transfer to tertiary care center-on waiting list at [place] and oncology Dr. Mike Gip recommended to transfer the patient to [facility] and she is clinically not improving.  Patient is agreeable Call placed to [facility] and discussed with on-call hospitalist Dr. Margretta Sidle, he will talk to Dr. Marius Ditch to get more details.  [facility] gastroenterologist Dr. Earlie Counts is recommending to transfer the patient to [facility] as per Patsey Berthold  discussion -Continue PPI -Xarelto discontinued, last dose was on 11/17/2018 -Monitor daily H&H, hemoglobin 9.9-10.3  Stage IV cervical cancer on chemotherapy -Oncology following.  Patient to follow-up with Dr. Mike Gip as an outpatient after discharge  Hypomagnesemia- mag remains low -Replete and recheck  Acute on chronic diarrhea- improved -C. Diff negative  Chronic pancytopenia- likely related to chemo -Oncology following  History of hepatitis C -Needs to follow-up with gastroenterology at [facility]  ?  Adrenal insufficiency- cortisol level has  been low as an outpatient -Continue home hydrocortisone -Needs to follow-up with endocrinology as an outpatient  Anticipating to transfer the patient to [facility] or if clinically better will discharge patient home tomorrow patient wants to get Dr. Kem Parkinson opinion in a.m.  Plan is for transfer to tertiary care center for further GI work-up.  Unfortunately, there are long wait lists at [facility].  Patient still prefers getting transferred to [facility] or [facility]   All the records are reviewed and case discussed with Care Management/Social Worker. Management plans discussed with the patient, family and they are in agreement.  CODE STATUS: Full Code  TOTAL TIME TAKING CARE OF THIS PATIENT: 39 minutes.   More than 50% of the time was spent in counseling/coordination of care: YES  POSSIBLE D/C IN 1-2 DAYS, DEPENDING ON CLINICAL CONDITION.   Nicholes Mango M.D on 11/25/2018 at 4:04 PM  Between 7am to 6pm - Pager - [phone]  After 6pm go to [email or web address] - Proofreader  [facility]  Office  [phone]  CC: Primary care physician; Arnetha Courser, MD  Note: This dictation was prepared with Dragon dictation along with smaller phrase technology. Any transcriptional errors that  result from this process are unintentional.

## 2018-11-25 NOTE — Progress Notes (Addendum)
Joanna Darby, MD [address]  [place]  [place], [place] [address]  Main: [phone]  Fax: [phone] Pager: [phone]   Subjective: Patient has been tolerating regular diet.  She has been receiving Lomotil 2 tabs every 6 hours up to 3 times a day.  She feels constipated today, so she refused Lomotil.  She reports rectal pressure and when she had a bowel movement she had to strain.  She had 3 mushy bowel movements yesterday, denies any rectal bleeding.  Anticipating discharge home today.  She reports her abdominal pain is at baseline.   Objective: Vital signs in last 24 hours: Vitals:   11/24/18 0648 11/24/18 1613 11/24/18 1936 11/25/18 0427  BP: 123/82 105/69 111/76 103/69  Pulse: 67 82 80 63  Resp: 18 18 18 18   Temp: (!) 97.5 F (36.4 C) 98.8 F (37.1 C) 97.8 F (36.6 C) 97.6 F (36.4 C)  TempSrc: Oral Oral Oral Oral  SpO2: 100% 100% 98% 97%  Weight:      Height:       Weight change:   Intake/Output Summary (Last 24 hours) at 11/25/2018 1252 Last data filed at 11/25/2018 0946 Gross per 24 hour  Intake 620 ml  Output -  Net 620 ml     Exam: Heart:: Regular rate and rhythm, S1S2 present or without murmur or extra heart sounds Lungs: normal and clear to auscultation Abdomen: soft, mild diffuse abdominal tenderness, normal bowel sounds   Lab Results: CBC Latest Ref Rng & Units 11/25/2018 11/24/2018 11/23/2018  WBC 4.0 - 10.5 K/uL 3.2(L) 3.2(L) 2.9(L)  Hemoglobin 12.0 - 15.0 g/dL 10.8(L) 10.3(L) 9.9(L)  Hematocrit 36.0 - 46.0 % 32.3(L) 31.3(L) 30.7(L)  Platelets 150 - 400 K/uL 148(L) 141(L) 131(L)   CMP Latest Ref Rng & Units 11/23/2018 11/22/2018 11/21/2018  Glucose 70 - 99 mg/dL 99 100(H) 93  BUN 6 - 20 mg/dL 12 8 7   Creatinine 0.44 - 1.00 mg/dL 0.67 0.69 0.74  Sodium 135 - 145 mmol/L 135 135 135  Potassium 3.5 - 5.1 mmol/L 4.0 3.8 3.9  Chloride 98 - 111 mmol/L 103 104 103  CO2 22 - 32 mmol/L 24 28 28   Calcium 8.9 - 10.3 mg/dL 8.4(L) 8.4(L) 8.6(L)    Total Protein 6.5 - 8.1 g/dL - - -  Total Bilirubin 0.3 - 1.2 mg/dL - - -  Alkaline Phos 38 - 126 U/L - - -  AST 15 - 41 U/L - - -  ALT 0 - 44 U/L - - -   Micro Results: Recent Results (from the past 240 hour(s))  C difficile quick scan w PCR reflex     Status: None   Collection Time: 11/20/18 10:29 AM  Result Value Ref Range Status   C Diff antigen NEGATIVE NEGATIVE Final   C Diff toxin NEGATIVE NEGATIVE Final   C Diff interpretation No C. difficile detected.  Final    Comment: Performed at [facility], [address] [place], [place] [address]   Studies/Results: Dg Abd 1 View  Result Date: 11/25/2018 CLINICAL DATA:  Intermittent episodes of diarrhea, progressively worsening. Dizziness and GI bleed, sent to hospital as a direct admission. RIGHT lower quadrant abdominal pain radiating to LEFT side of lower abdomen. Known history of GERD, gastric ulcers, stage IV cervical cancer with last chemotherapy on 09/30/2018. EXAM: ABDOMEN - 1 VIEW COMPARISON:  None. FINDINGS: Overall bowel gas pattern is nonobstructive. No dilated small bowel loops seen. Fairly large amount of stool throughout  the large bowel with associated mild distention of the RIGHT colon/transverse colon. Metallic object again projects over the RIGHT lower abdomen, presumed bowel camera. Cholecystectomy clips in the RIGHT upper quadrant. No evidence of soft tissue mass or abnormal fluid collection. No evidence of free intraperitoneal air. No evidence of renal or ureteral calculi. Degenerative changes within the scoliotic lumbar spine, moderate in degree. No acute appearing osseous abnormality. IMPRESSION: 1. Nonobstructive bowel gas pattern. Fairly large amount of stool throughout the large bowel with associated mild distention of the RIGHT colon/transverse colon. 2. Presumed bowel camera within the RIGHT lower quadrant. Electronically Signed   By: Franki Cabot M.D.   On: 11/25/2018 12:48   Medications:  I have  reviewed the patient's current medications. Prior to Admission:  Medications Prior to Admission  Medication Sig Dispense Refill Last Dose  . amitriptyline (ELAVIL) 75 MG tablet Take 1 tablet (75 mg total) by mouth at bedtime. 90 tablet 1 11/18/2018 at 2100  . budesonide (ENTOCORT EC) 3 MG 24 hr capsule Take 3 capsules (9 mg total) by mouth daily. 30 capsule 0 11/18/2018 at 0900  . Calcium Carb-Cholecalciferol (CALCIUM 500 +D) 500-400 MG-UNIT TABS Take 2 tablets by mouth daily.   11/18/2018 at 0900  . diphenoxylate-atropine (LOMOTIL) 2.5-0.025 MG tablet Take 2 tablets by mouth 4 (four) times daily. 30 tablet 0 11/18/2018 at 2100  . hydrocortisone (CORTEF) 10 MG tablet Take 1 tablet (10 mg total) by mouth 2 (two) times daily. 180 tablet 0 11/18/2018 at 2100  . hyoscyamine (LEVBID) 0.375 MG 12 hr tablet Take 1 tablet (0.375 mg total) by mouth 2 (two) times daily. 60 tablet 0 11/18/2018 at 2100  . Multiple Vitamins-Minerals (MULTIVITAMIN WITH MINERALS) tablet Take 1 tablet by mouth daily. 30 tablet 0 11/18/2018 at 0900  . octreotide (SANDOSTATIN LAR DEPOT) 20 MG injection Inject 20 mg into the muscle every 28 (twenty-eight) days. 1 each 0 Past Month at Unknown time  . ondansetron (ZOFRAN) 8 MG tablet Take 1 tablet (8 mg total) by mouth every 8 (eight) hours as needed for nausea or vomiting. 30 tablet 1 prn at prn  . oxyCODONE (OXY IR/ROXICODONE) 5 MG immediate release tablet Take 1 tablet (5 mg total) by mouth every 6 (six) hours as needed for moderate pain or severe pain. 30 tablet 0 11/18/2018 at 2100  . pantoprazole (PROTONIX) 40 MG tablet Take 1 tablet (40 mg total) by mouth daily. 90 tablet 0 11/18/2018 at 0900  . potassium chloride SA (K-DUR,KLOR-CON) 20 MEQ tablet Take 1 tablet (20 mEq total) by mouth daily. for 4 days. 30 tablet 0 11/18/2018 at 0900  . VIBERZI 100 MG TABS TAKE 1 TABLET BY MOUTH TWICE DAILY WITH  A  MEAL 60 tablet 0 11/18/2018 at 2100  . promethazine (PHENERGAN) 25 MG tablet Take 1 tablet (25 mg  total) by mouth every 6 (six) hours as needed for nausea or vomiting. (Patient not taking: Reported on 11/19/2018) 30 tablet 0 Not Taking  . VENTOLIN HFA 108 (90 Base) MCG/ACT inhaler Inhale 1-2 puffs into the lungs every 4 (four) hours as needed for shortness of breath.   0 prn at prn   Scheduled: . amitriptyline  75 mg Oral QHS  . diphenoxylate-atropine  2 tablet Oral QID  . feeding supplement (ENSURE ENLIVE)  237 mL Oral BID BM  . hydrocortisone  25 mg Rectal BID  . hydrocortisone  10 mg Oral BID   Continuous:  JSH:[id number], famotidine, morphine injection, ondansetron **OR** ondansetron (ZOFRAN) IV,  oxyCODONE, promethazine Anti-infectives (From admission, onward)   None     Scheduled Meds: . amitriptyline  75 mg Oral QHS  . diphenoxylate-atropine  2 tablet Oral QID  . feeding supplement (ENSURE ENLIVE)  237 mL Oral BID BM  . hydrocortisone  25 mg Rectal BID  . hydrocortisone  10 mg Oral BID   Continuous Infusions: PRN Meds:.albuterol, famotidine, morphine injection, ondansetron **OR** ondansetron (ZOFRAN) IV, oxyCODONE, promethazine  DIAGNOSIS:  A. COLON, RANDOM; COLD BIOPSY:  - COLONIC MUCOSA WITH FOCAL MILD ACTIVE COLITIS.  - FOCAL ISCHEMIC CHANGE.  - NEGATIVE FOR DYSPLASIA AND MALIGNANCY.  - TRICHROME AND IHC FOR CMV AND HSV WILL BE REPORTED IN AN ADDENDUM.   B. RECTAL ERYTHEMA; COLD BIOPSY:  - COLONIC MUCOSA WITH FOCAL ISCHEMIC CHANGE AND CHANGES CONSISTENT WITH  PRIOR RADIATION.  - NEGATIVE FOR DYSPLASIA AND MALIGNANCY.  - CONGO RED AND IHC FOR CMV AND HSV WILL BE REPORTED IN AN ADDENDUM.  Assessment: Active Problems:   GI bleed  MAELIN KURKOWSKI is a 53 y.o. 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  Patient did not have rectal bleeding/hematochezia since admission.  Hemoglobin had been stable APC as needed for radiation proctitis Resume anticoagulation from GI standpoint  Chronic diarrhea, patient has history of ileal colonic anastomosis but majority of her colon and ileum are intact She had an extensive work-up to date which was fairly unremarkable except for some nonspecific features in the small bowel, few aphthae in the terminal ileum.  Patient had random colon biopsies which revealed mild active colitis with focal ischemic change.  No evidence of chronicity.  These features are not suggestive of inflammatory bowel disease rather could be secondary to history of radiation treatment even temporally in the past. I ordered stool osmolality however this was not processed due to reported solid stool sample.  However, patient reports that her stools have always been watery.  Reordered stool osmolality, collected on 11/23/2018, in process Stool electrolytes are in process, sent out Patient has been receiving only hydrocortisone 10 mg twice daily and amitriptyline 75 mg daily since admission, started on Lomotil 2 tabs 4 times a day on 11/22/2018.  Since then, her diarrhea has resolved.  Today, she reports constipation with rectal pressure.  When I advised her to take stool softener, she said she has Dulcolax at home that she could take.  I strongly advised her against using laxatives because she could be laxative dependent.  She then said, Dulcolax is her mom's that she uses as needed.  Advised her to use MiraLAX or Colace only. I also recommended her to not to restart rest of her outpatient antidiarrheal medications I have been following this patient for several months, she did not have weight loss despite having chronic diarrhea which makes it unlikely malabsorptive or  inflammatory etiology.  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  Patient wants to know if she has passed the capsule yet.  X-ray KUB today revealed mildly dilated right colon with fairly large amount of stool throughout the large bowel and capsule in the right lower abdomen. Will give her MiraLAX today   We have sent in a transfer request to [facility] or [facility], just heard back from [facility] saying that they would like the patient to be transferred to [facility] regional      LOS: 6 days   Nayel Purdy 11/25/2018, 12:52 PM

## 2018-11-25 NOTE — Progress Notes (Signed)
Pt present with onset N/V/D. Zofran provided. Pt c/o pain in right lower quadrant of abdomen. MD aware. Discharge canceled. Pt currently in bed on cell phone. Will continue to monitor

## 2018-11-25 NOTE — Progress Notes (Signed)
Pt pain has remained at an 8 on a 0-10 pain scale consistently today. Pt's presentation has varied following administration of pain medication' Pt has gone from rocking and guarding right lower abdominal quadrant to lying back in bed stretched out following pain medication administration.

## 2018-11-25 NOTE — Plan of Care (Signed)
Discharge canceled d/t repeat onset of N/V/D pt now resting comfortable. MD added fluids and placed pt NPO. Managing nausea with Zofran and phenergan.

## 2018-11-26 ENCOUNTER — Other Ambulatory Visit: Payer: Medicaid Other

## 2018-11-26 ENCOUNTER — Inpatient Hospital Stay: Payer: Medicaid Other

## 2018-11-26 ENCOUNTER — Encounter: Payer: Self-pay | Admitting: Hematology and Oncology

## 2018-11-26 ENCOUNTER — Encounter: Payer: Self-pay | Admitting: Urgent Care

## 2018-11-26 ENCOUNTER — Ambulatory Visit: Payer: Medicaid Other | Admitting: Hematology and Oncology

## 2018-11-26 ENCOUNTER — Ambulatory Visit: Payer: Medicaid Other

## 2018-11-26 DIAGNOSIS — K591 Functional diarrhea: Secondary | ICD-10-CM

## 2018-11-26 LAB — CBC
HCT: 31.3 % — ABNORMAL LOW (ref 36.0–46.0)
Hemoglobin: 10.2 g/dL — ABNORMAL LOW (ref 12.0–15.0)
MCH: 34.6 pg — ABNORMAL HIGH (ref 26.0–34.0)
MCHC: 32.6 g/dL (ref 30.0–36.0)
MCV: 106.1 fL — ABNORMAL HIGH (ref 80.0–100.0)
Platelets: 147 10*3/uL — ABNORMAL LOW (ref 150–400)
RBC: 2.95 MIL/uL — ABNORMAL LOW (ref 3.87–5.11)
RDW: 13.7 % (ref 11.5–15.5)
WBC: 2.6 10*3/uL — ABNORMAL LOW (ref 4.0–10.5)
nRBC: 0 % (ref 0.0–0.2)

## 2018-11-26 LAB — OSMOLALITY, STOOL

## 2018-11-26 MED ORDER — IOPAMIDOL (ISOVUE-300) INJECTION 61%
15.0000 mL | INTRAVENOUS | Status: AC
  Administered 2018-11-26 (×2): 15 mL via ORAL

## 2018-11-26 MED ORDER — POLYETHYLENE GLYCOL 3350 17 G PO PACK: 17.0000 g | PACK | Freq: Every day | ORAL | 0 refills | Status: DC | PRN

## 2018-11-26 MED ORDER — IOHEXOL 300 MG/ML  SOLN
100.0000 mL | Freq: Once | INTRAMUSCULAR | Status: AC | PRN
  Administered 2018-11-26: 14:00:00 100 mL via INTRAVENOUS

## 2018-11-26 MED ORDER — ENSURE ENLIVE PO LIQD: 237.0000 mL | Freq: Two times a day (BID) | ORAL | 12 refills | Status: DC

## 2018-11-26 MED ORDER — SODIUM CHLORIDE 0.9% FLUSH: 10.0000 mL | INTRAVENOUS | Status: DC | PRN

## 2018-11-26 MED ORDER — DIPHENOXYLATE-ATROPINE 2.5-0.025 MG PO TABS: 2.0000 | ORAL_TABLET | Freq: Four times a day (QID) | ORAL | 0 refills | Status: DC

## 2018-11-26 MED ORDER — HEPARIN SOD (PORK) LOCK FLUSH 100 UNIT/ML IV SOLN
500.0000 [IU] | INTRAVENOUS | Status: DC | PRN
  Filled 2018-11-26: qty 5

## 2018-11-26 MED ORDER — OXYCODONE HCL 10 MG PO TABS: 10.0000 mg | ORAL_TABLET | Freq: Four times a day (QID) | ORAL | 0 refills | Status: DC | PRN

## 2018-11-26 NOTE — Progress Notes (Signed)
[name], MD [address]  [place]  [place], [place] [address]  Main: [phone]  Fax: [phone] Pager: [phone]   Subjective: No acute events overnight. She denies worsening of abdominal pain, overall feeling better today on clear liquids, did not have a bm today. Took mirlalax in am. Had KUB and CT Abdomen.   Objective: Vital signs in last 24 hours: Vitals:   11/25/18 0427 11/25/18 1502 11/25/18 2109 11/26/18 0420  BP: 103/69 115/63 105/73 105/74  Pulse: 63 81 86 62  Resp: 18  18 16   Temp: 97.6 F (36.4 C) 98 F (36.7 C) 98.2 F (36.8 C) (!) 97.5 F (36.4 C)  TempSrc: Oral Oral Oral Oral  SpO2: 97% 97% 96% 99%  Weight:      Height:       Weight change:   Intake/Output Summary (Last 24 hours) at 11/26/2018 1903 Last data filed at 11/26/2018 1707 Gross per 24 hour  Intake 1842.23 ml  Output -  Net 1842.23 ml     Exam: Heart:: Regular rate and rhythm, S1S2 present or without murmur or extra heart sounds Lungs: normal and clear to auscultation Abdomen: soft, mild diffuse abdominal tenderness, normal bowel sounds   Lab Results: CBC Latest Ref Rng & Units 11/26/2018 11/25/2018 11/24/2018  WBC 4.0 - 10.5 K/uL 2.6(L) 3.2(L) 3.2(L)  Hemoglobin 12.0 - 15.0 g/dL 10.2(L) 10.8(L) 10.3(L)  Hematocrit 36.0 - 46.0 % 31.3(L) 32.3(L) 31.3(L)  Platelets 150 - 400 K/uL 147(L) 148(L) 141(L)   CMP Latest Ref Rng & Units 11/23/2018 11/22/2018 11/21/2018  Glucose 70 - 99 mg/dL 99 100(H) 93  BUN 6 - 20 mg/dL 12 8 7   Creatinine 0.44 - 1.00 mg/dL 0.67 0.69 0.74  Sodium 135 - 145 mmol/L 135 135 135  Potassium 3.5 - 5.1 mmol/L 4.0 3.8 3.9  Chloride 98 - 111 mmol/L 103 104 103  CO2 22 - 32 mmol/L 24 28 28   Calcium 8.9 - 10.3 mg/dL 8.4(L) 8.4(L) 8.6(L)  Total Protein 6.5 - 8.1 g/dL - - -  Total Bilirubin 0.3 - 1.2 mg/dL - - -  Alkaline Phos 38 - 126 U/L - - -  AST 15 - 41 U/L - - -  ALT 0 - 44 U/L - - -   Micro Results: Recent Results (from the past 240 hour(s))   C difficile quick scan w PCR reflex     Status: None   Collection Time: 11/20/18 10:29 AM  Result Value Ref Range Status   C Diff antigen NEGATIVE NEGATIVE Final   C Diff toxin NEGATIVE NEGATIVE Final   C Diff interpretation No C. difficile detected.  Final    Comment: Performed at [facility], [address] [place], [place] [address]   Studies/Results: Dg Abd 1 View  Result Date: 11/26/2018 CLINICAL DATA:  53 year old female. Evaluate for retained capsule. Subsequent encounter. EXAM: ABDOMEN - 1 VIEW COMPARISON:  11/25/2018. FINDINGS: Radiopaque foreign body right lower quadrant of the abdomen consistent with bowel camera. This has shifted position from the prior examination now projecting at the level of the cecum/terminal ileum level. Post cholecystectomy. Decreased amount of stool compared to yesterday's exam. Scoliosis lumbar spine with superimposed degenerative changes. IMPRESSION: Radiopaque foreign body right lower quadrant of the abdomen consistent with bowel camera. This has shifted position from the prior examination now projecting at the level of the cecum/terminal ileum level. Electronically Signed   By: Genia Del M.D.   On: 11/26/2018 13:00   Dg  Abd 1 View  Result Date: 11/25/2018 CLINICAL DATA:  Intermittent episodes of diarrhea, progressively worsening. Dizziness and GI bleed, sent to hospital as a direct admission. RIGHT lower quadrant abdominal pain radiating to LEFT side of lower abdomen. Known history of GERD, gastric ulcers, stage IV cervical cancer with last chemotherapy on 09/30/2018. EXAM: ABDOMEN - 1 VIEW COMPARISON:  None. FINDINGS: Overall bowel gas pattern is nonobstructive. No dilated small bowel loops seen. Fairly large amount of stool throughout the large bowel with associated mild distention of the RIGHT colon/transverse colon. Metallic object again projects over the RIGHT lower abdomen, presumed bowel camera. Cholecystectomy clips in the RIGHT upper  quadrant. No evidence of soft tissue mass or abnormal fluid collection. No evidence of free intraperitoneal air. No evidence of renal or ureteral calculi. Degenerative changes within the scoliotic lumbar spine, moderate in degree. No acute appearing osseous abnormality. IMPRESSION: 1. Nonobstructive bowel gas pattern. Fairly large amount of stool throughout the large bowel with associated mild distention of the RIGHT colon/transverse colon. 2. Presumed bowel camera within the RIGHT lower quadrant. Electronically Signed   By: Franki Cabot M.D.   On: 11/25/2018 12:48   Ct Abdomen Pelvis W Contrast  Result Date: 11/26/2018 CLINICAL DATA:  Lower GI bleeding. EXAM: CT ABDOMEN AND PELVIS WITH CONTRAST TECHNIQUE: Multidetector CT imaging of the abdomen and pelvis was performed using the standard protocol following bolus administration of intravenous contrast. CONTRAST:  168m OMNIPAQUE IOHEXOL 300 MG/ML  SOLN COMPARISON:  KUB from earlier today. Chest CT 04/15/2018. Abdomen and pelvis CT 10/17/2018 FINDINGS: Lower chest: Numerous bilateral pulmonary nodules are identified and similar to multiple prior studies. Index 11 mm nodule identified right lower lobe on 03/04. Index nodule left lower lobe on 02/04 measures 7 mm. No pleural effusion. Hepatobiliary: No suspicious focal abnormality within the liver parenchyma. Gallbladder surgically absent. Trace intrahepatic biliary duct dilatation evident. Extrahepatic common duct measures 13 mm diameter. Common bile duct in the head of the pancreas is 9 mm diameter. Pancreas: No focal mass lesion. No dilatation of the main duct. No intraparenchymal cyst. No peripancreatic edema. Spleen: No splenomegaly. No focal mass lesion. Adrenals/Urinary Tract: No adrenal nodule or mass. Tiny nonobstructing renal stones noted bilaterally (coronal image 53/5 right kidney and coronal image 60/5 left kidney). No evidence for hydroureter. The urinary bladder appears normal for the degree of  distention. Stomach/Bowel: Stomach is unremarkable. No gastric wall thickening. No evidence of outlet obstruction. Duodenum is normally positioned as is the ligament of Treitz. No small bowel wall thickening. No small bowel dilatation. Patient appears to be status post right colectomy. Capsule endoscope is identified in the region of the ileocolic anastomosis. No bowel dilatation proximal to the level of the capsule. Wall thickening noted in the sigmoid colon with the similar appearance of the stranding around the sigmoid colon. Vascular/Lymphatic: There is abdominal aortic atherosclerosis without aneurysm. There is no gastrohepatic or hepatoduodenal ligament lymphadenopathy. No intraperitoneal or retroperitoneal lymphadenopathy. Small retroperitoneal lymph nodes are stable. No pelvic sidewall lymphadenopathy. Reproductive: Stable appearance of the uterus and cervix. Other: No substantial intraperitoneal free fluid Musculoskeletal: Edema noted in the pelvic floor, as before. No worrisome lytic or sclerotic osseous abnormality. IMPRESSION: 1. Status post right hemicolectomy. Capsule endoscope noted in the region of the ileocolic anastomosis. Difficult to definitively localize the capsule due to streak artifact from the metallic components, but it does appear distal to the anastomosis. No evidence for small bowel obstruction. 2. Persistent mild wall thickening in the sigmoid colon with edema/stranding  in the surrounding fat in soft tissues of the pelvic floor. Imaging features likely treatment related in this patient with a history of cervical cancer. 3. Stable intra and extrahepatic biliary duct dilatation in this patient status post cholecystectomy. 4. Stable appearance of numerous bilateral pulmonary nodules consistent with metastatic disease. 5.  Aortic Atherosclerois (ICD10-170.0) Electronically Signed   By: Misty Stanley M.D.   On: 11/26/2018 14:08   Medications:  I have reviewed the patient's current  medications. Prior to Admission:  Medications Prior to Admission  Medication Sig Dispense Refill Last Dose  . amitriptyline (ELAVIL) 75 MG tablet Take 1 tablet (75 mg total) by mouth at bedtime. 90 tablet 1 11/18/2018 at 2100  . budesonide (ENTOCORT EC) 3 MG 24 hr capsule Take 3 capsules (9 mg total) by mouth daily. 30 capsule 0 11/18/2018 at 0900  . Calcium Carb-Cholecalciferol (CALCIUM 500 +D) 500-400 MG-UNIT TABS Take 2 tablets by mouth daily.   11/18/2018 at 0900  . hydrocortisone (CORTEF) 10 MG tablet Take 1 tablet (10 mg total) by mouth 2 (two) times daily. 180 tablet 0 11/18/2018 at 2100  . hyoscyamine (LEVBID) 0.375 MG 12 hr tablet Take 1 tablet (0.375 mg total) by mouth 2 (two) times daily. 60 tablet 0 11/18/2018 at 2100  . Multiple Vitamins-Minerals (MULTIVITAMIN WITH MINERALS) tablet Take 1 tablet by mouth daily. 30 tablet 0 11/18/2018 at 0900  . octreotide (SANDOSTATIN LAR DEPOT) 20 MG injection Inject 20 mg into the muscle every 28 (twenty-eight) days. 1 each 0 Past Month at Unknown time  . ondansetron (ZOFRAN) 8 MG tablet Take 1 tablet (8 mg total) by mouth every 8 (eight) hours as needed for nausea or vomiting. 30 tablet 1 prn at prn  . oxyCODONE (OXY IR/ROXICODONE) 5 MG immediate release tablet Take 1 tablet (5 mg total) by mouth every 6 (six) hours as needed for moderate pain or severe pain. 30 tablet 0 11/18/2018 at 2100  . pantoprazole (PROTONIX) 40 MG tablet Take 1 tablet (40 mg total) by mouth daily. 90 tablet 0 11/18/2018 at 0900  . potassium chloride SA (K-DUR,KLOR-CON) 20 MEQ tablet Take 1 tablet (20 mEq total) by mouth daily. for 4 days. 30 tablet 0 11/18/2018 at 0900  . VIBERZI 100 MG TABS TAKE 1 TABLET BY MOUTH TWICE DAILY WITH  A  MEAL 60 tablet 0 11/18/2018 at 2100  . [DISCONTINUED] diphenoxylate-atropine (LOMOTIL) 2.5-0.025 MG tablet Take 2 tablets by mouth 4 (four) times daily. 30 tablet 0 11/18/2018 at 2100  . promethazine (PHENERGAN) 25 MG tablet Take 1 tablet (25 mg total) by mouth every  6 (six) hours as needed for nausea or vomiting. (Patient not taking: Reported on 11/19/2018) 30 tablet 0 Not Taking  . VENTOLIN HFA 108 (90 Base) MCG/ACT inhaler Inhale 1-2 puffs into the lungs every 4 (four) hours as needed for shortness of breath.   0 prn at prn   Scheduled: . amitriptyline  75 mg Oral QHS  . feeding supplement (ENSURE ENLIVE)  237 mL Oral BID BM  . hydrocortisone  25 mg Rectal BID  . hydrocortisone  10 mg Oral BID  . polyethylene glycol  17 g Oral BID   Continuous:  [id number], famotidine, heparin lock flush, morphine injection, ondansetron **OR** ondansetron (ZOFRAN) IV, oxyCODONE, promethazine, sodium chloride flush Anti-infectives (From admission, onward)   None     Scheduled Meds: . amitriptyline  75 mg Oral QHS  . feeding supplement (ENSURE ENLIVE)  237 mL Oral BID BM  . hydrocortisone  25 mg Rectal BID  . hydrocortisone  10 mg Oral BID  . polyethylene glycol  17 g Oral BID   Continuous Infusions: PRN Meds:.albuterol, famotidine, heparin lock flush, morphine injection, ondansetron **OR** ondansetron (ZOFRAN) IV, oxyCODONE, promethazine, sodium chloride flush  DIAGNOSIS:  A. COLON, RANDOM; COLD BIOPSY:  - COLONIC MUCOSA WITH FOCAL MILD ACTIVE COLITIS.  - FOCAL ISCHEMIC CHANGE.  - NEGATIVE FOR DYSPLASIA AND MALIGNANCY.  - TRICHROME AND IHC FOR CMV AND HSV WILL BE REPORTED IN AN ADDENDUM.   B. RECTAL ERYTHEMA; COLD BIOPSY:  - COLONIC MUCOSA WITH FOCAL ISCHEMIC CHANGE AND CHANGES CONSISTENT WITH  PRIOR RADIATION.  - NEGATIVE FOR DYSPLASIA AND MALIGNANCY.  - CONGO RED AND IHC FOR CMV AND HSV WILL BE REPORTED IN AN ADDENDUM.  Assessment: Active Problems:   GI bleed  COCO Hall is a 53 y.o. 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  Patient did not have rectal bleeding/hematochezia since admission.  Hemoglobin has been stable APC as needed for radiation proctitis Resume anticoagulation from GI standpoint  Chronic diarrhea, patient has history of ileal colonic anastomosis but majority of her colon and ileum are intact. She had radiation treatment for cervical cancer.  She had an extensive work-up to date which was fairly unremarkable except for some nonspecific features in the small bowel, few aphthae in the terminal ileum.  Patient had random colon biopsies which revealed mild active colitis with focal ischemic change.  No evidence of chronicity.  These features are not suggestive of inflammatory bowel disease rather could be secondary to history of radiation treatment even temporally in the past. I ordered stool osmolality twice, however this was not processed due to reported solid stool sample by the lab.  Stool electrolytes are in process, sent out Patient has been receiving only hydrocortisone 10 mg twice daily and amitriptyline 75 mg daily since admission, started on Lomotil 2 tabs 4 times a day on 11/22/2018.  Since then, her diarrhea has resolved, resulted in constipation with rectal pressure.  When I advised her to take stool softener, she said she has Dulcolax at home that she could take.  I strongly advised her against using laxatives because she could be laxative dependent.  She then said, Dulcolax is her mom's that she uses as needed.  Advised her to use MiraLAX or Colace only. I also recommended her to not to restart rest of her outpatient antidiarrheal medications I have been following this patient for several months, she did not have weight loss or protein-calorie malnutrition despite having chronic diarrhea which makes it unlikely malabsorptive  or inflammatory or secretory etiology.  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 I will refer her for anorectal manometry as outpt. We also tried to transfer her to [facility] as inpatient. Since, her diarrhea resolved, they refused to accept the transfer, rather advised [place] to call their referral center to expedite outpt GI appointment.  Patient had video capsule endoscopy last week which was inconclusive as capsule did not reach cecum. CT today did not show evidence of bowel obstruction, capsule is probably located distal to the anastomosis.  At this time, from GI standpoint patient can proceed with chemotherapy and communicated same to  her oncologist Dr Mike Gip.   Advance diet today and if tolerates, discharge home and follow up with me and oncology closely. I reiterated my impression and recommendations of her chronic diarrhea with patient and her mother.  Patient expressed understanding of the plan.     LOS: 7 days   Layken Beg 11/26/2018, 7:03 PM

## 2018-11-26 NOTE — Progress Notes (Signed)
[facility] NAME: Joanna Hall    MR#:  [id number]  DATE OF BIRTH:  1965/12/13  SUBJECTIVE:   Patient states she is doing fine this morning.  Eventually she started having right lower quadrant abdominal pain associated with nausea and vomiting, hemorrhoids are hurting   REVIEW OF SYSTEMS:  Review of Systems  Constitutional: Positive for malaise/fatigue. Negative for chills and fever.  HENT: Negative for congestion and sore throat.   Eyes: Negative for blurred vision and double vision.  Respiratory: Negative for cough and shortness of breath.   Cardiovascular: Negative for chest pain and palpitations.  Gastrointestinal: Positive for abdominal pain. Negative for blood in stool, diarrhea, melena, nausea and vomiting.  Genitourinary: Negative for dysuria, hematuria and urgency.  Musculoskeletal: Negative for back pain and neck pain.  Neurological: Positive for weakness. Negative for dizziness, focal weakness and headaches.  Psychiatric/Behavioral: Negative for depression. The patient is not nervous/anxious.     DRUG ALLERGIES:   Allergies  Allergen Reactions  . Ketamine Anxiety and Other (See Comments)    Syncope episode/confusion    VITALS:  Blood pressure 105/74, pulse 62, temperature (!) 97.5 F (36.4 C), temperature source Oral, resp. rate 16, height 5' 8"  (1.727 m), weight 80.7 kg, SpO2 99 %. PHYSICAL EXAMINATION:  Physical Exam  GENERAL:  53 y.o.-year-old patient lying in the bed with no acute distress.  Chronically ill-appearing. EYES: Pupils equal, round, reactive to light and accommodation. No scleral icterus. Extraocular muscles intact.  HEENT: Head atraumatic, normocephalic. Oropharynx and nasopharynx clear.  NECK:  Supple, no jugular venous distention. No thyroid enlargement, no tenderness.  LUNGS: Normal breath sounds bilaterally, no wheezing, rales,rhonchi or crepitation. No use of accessory muscles of  respiration.  CARDIOVASCULAR: RRR, S1, S2 normal. No murmurs, rubs, or gallops.  ABDOMEN: Soft, + generalized tenderness across the lower abdomen, no rebound or guarding, nondistended. Bowel sounds present.  EXTREMITIES: No pedal edema, cyanosis, or clubbing.  NEUROLOGIC: CN 2-12 grossly intact, +global weakness. Sensation intact. Gait not checked.  PSYCHIATRIC: The patient is alert and oriented x 3.  SKIN: No obvious rash, lesion, or ulcer.  LABORATORY PANEL:  Female CBC Recent Labs  Lab 11/26/18 0531  WBC 2.6*  HGB 10.2*  HCT 31.3*  PLT 147*   ------------------------------------------------------------------------------------------------------------------ Chemistries  Recent Labs  Lab 11/20/18 0515  11/23/18 0549 11/25/18 1204  NA 136   < > 135  --   K 4.3   < > 4.0  --   CL 106   < > 103  --   CO2 27   < > 24  --   GLUCOSE 94   < > 99  --   BUN 9   < > 12  --   CREATININE 0.73   < > 0.67  --   CALCIUM 8.5*   < > 8.4*  --   MG 1.7   < > 1.5* 1.4*  AST 37  --   --   --   ALT 44  --   --   --   ALKPHOS 114  --   --   --   BILITOT 0.3  --   --   --    < > = values in this interval not displayed.   RADIOLOGY:  Dg Abd 1 View  Result Date: 11/26/2018 CLINICAL DATA:  53 year old female. Evaluate for retained capsule. Subsequent encounter. EXAM: ABDOMEN - 1 VIEW COMPARISON:  11/25/2018. FINDINGS: Radiopaque  foreign body right lower quadrant of the abdomen consistent with bowel camera. This has shifted position from the prior examination now projecting at the level of the cecum/terminal ileum level. Post cholecystectomy. Decreased amount of stool compared to yesterday's exam. Scoliosis lumbar spine with superimposed degenerative changes. IMPRESSION: Radiopaque foreign body right lower quadrant of the abdomen consistent with bowel camera. This has shifted position from the prior examination now projecting at the level of the cecum/terminal ileum level. Electronically Signed   By:  Genia Del M.D.   On: 11/26/2018 13:00   Ct Abdomen Pelvis W Contrast  Result Date: 11/26/2018 CLINICAL DATA:  Lower GI bleeding. EXAM: CT ABDOMEN AND PELVIS WITH CONTRAST TECHNIQUE: Multidetector CT imaging of the abdomen and pelvis was performed using the standard protocol following bolus administration of intravenous contrast. CONTRAST:  118m OMNIPAQUE IOHEXOL 300 MG/ML  SOLN COMPARISON:  KUB from earlier today. Chest CT 04/15/2018. Abdomen and pelvis CT 10/17/2018 FINDINGS: Lower chest: Numerous bilateral pulmonary nodules are identified and similar to multiple prior studies. Index 11 mm nodule identified right lower lobe on 03/04. Index nodule left lower lobe on 02/04 measures 7 mm. No pleural effusion. Hepatobiliary: No suspicious focal abnormality within the liver parenchyma. Gallbladder surgically absent. Trace intrahepatic biliary duct dilatation evident. Extrahepatic common duct measures 13 mm diameter. Common bile duct in the head of the pancreas is 9 mm diameter. Pancreas: No focal mass lesion. No dilatation of the main duct. No intraparenchymal cyst. No peripancreatic edema. Spleen: No splenomegaly. No focal mass lesion. Adrenals/Urinary Tract: No adrenal nodule or mass. Tiny nonobstructing renal stones noted bilaterally (coronal image 53/5 right kidney and coronal image 60/5 left kidney). No evidence for hydroureter. The urinary bladder appears normal for the degree of distention. Stomach/Bowel: Stomach is unremarkable. No gastric wall thickening. No evidence of outlet obstruction. Duodenum is normally positioned as is the ligament of Treitz. No small bowel wall thickening. No small bowel dilatation. Patient appears to be status post right colectomy. Capsule endoscope is identified in the region of the ileocolic anastomosis. No bowel dilatation proximal to the level of the capsule. Wall thickening noted in the sigmoid colon with the similar appearance of the stranding around the sigmoid colon.  Vascular/Lymphatic: There is abdominal aortic atherosclerosis without aneurysm. There is no gastrohepatic or hepatoduodenal ligament lymphadenopathy. No intraperitoneal or retroperitoneal lymphadenopathy. Small retroperitoneal lymph nodes are stable. No pelvic sidewall lymphadenopathy. Reproductive: Stable appearance of the uterus and cervix. Other: No substantial intraperitoneal free fluid Musculoskeletal: Edema noted in the pelvic floor, as before. No worrisome lytic or sclerotic osseous abnormality. IMPRESSION: 1. Status post right hemicolectomy. Capsule endoscope noted in the region of the ileocolic anastomosis. Difficult to definitively localize the capsule due to streak artifact from the metallic components, but it does appear distal to the anastomosis. No evidence for small bowel obstruction. 2. Persistent mild wall thickening in the sigmoid colon with edema/stranding in the surrounding fat in soft tissues of the pelvic floor. Imaging features likely treatment related in this patient with a history of cervical cancer. 3. Stable intra and extrahepatic biliary duct dilatation in this patient status post cholecystectomy. 4. Stable appearance of numerous bilateral pulmonary nodules consistent with metastatic disease. 5.  Aortic Atherosclerois (ICD10-170.0) Electronically Signed   By: EMisty StanleyM.D.   On: 11/26/2018 14:08   ASSESSMENT AND PLAN:  Intractable nausea and vomiting Tolerating clear liquids, PPI, IV fluids GI is following  Acute on chronic right lower quadrant abdominal pain Video capsule endoscope noted in the  region of ileocolic anastomosis on CT scan.  GI is following Pain management as needed   Acute lower GI bleed with history of multiple GI ulcers in the past- hemoglobin has been stable. Clinically better -s/p flex sig on 2/6-mostly unremarkable except for erythematous mucosa in the rectum treated with argon plasma coagulation.   -GI following- recommend transfer to tertiary  care center-on waiting list at [facility], [facility] as per my  discussion with Dr. Verta Ellen on 11/25/2018 and  [facility] refused to take the patient -Continue PPI -Xarelto discontinued, last dose was on 11/17/2018 -Monitor daily H&H, hemoglobin 9.9-10.3 -CT abdomen and pelvis has revealed-Status post right hemicolectomy. Capsule endoscope noted in the region of the ileocolic anastomosis  Stage IV cervical cancer on chemotherapy -Oncology following.  Patient to follow-up with Dr. Mike Gip as an outpatient after discharge  Hypomagnesemia- mag remains low -Replete and recheck  Acute on chronic diarrhea- improved -C. Diff negative  Chronic pancytopenia- likely related to chemo -Oncology following  History of hepatitis C -Needs to follow-up with gastroenterology at [facility]  ?  Adrenal insufficiency- cortisol level has been low as an outpatient -Continue home hydrocortisone -Needs to follow-up with endocrinology as an outpatient  Anticipating to transfer the patient to [facility] or if clinically better will discharge patient home tomorrow patient wants to get Dr. Kem Parkinson opinion in a.m.  Plan is for transfer to tertiary care center for further GI work-up.  Unfortunately, there are long wait lists at [facility]   Patient still prefers getting transferred to [facility] or [place].  [place] [facility] refused to take the patient   All the records are reviewed and case discussed with Care Management/Social Worker. Management plans discussed with the patient, family and they are in agreement.  CODE STATUS: Full Code  TOTAL TIME TAKING CARE OF THIS PATIENT: 33 minutes.   More than 50% of the time was spent in counseling/coordination of care: YES  POSSIBLE D/C IN ? DAYS, DEPENDING ON CLINICAL CONDITION.   Nicholes Mango M.D on 11/26/2018 at 2:56 PM  Between 7am to 6pm - Pager - [phone]  After 6pm go to [email or web address] - Proofreader  [facility]  Office  [phone]  CC: Primary care physician; Arnetha Courser, MD  Note: This dictation was prepared with Dragon dictation along with smaller phrase technology. Any transcriptional errors that result from this process are unintentional.

## 2018-11-26 NOTE — Discharge Instructions (Signed)
Follow-up with primary care physician after discharge in 3 days Follow-up with Dr. Mike Gip oncologist in 1 week following discharge Follow-up with [facility] gastroenterology ASAP in 2 to 3 days Follow-up with Dr. Marius Ditch in 2 weeks

## 2018-11-26 NOTE — Progress Notes (Signed)
   11/26/18 1100  Clinical Encounter Type  Visited With Patient  Visit Type Follow-up  Spiritual Encounters  Spiritual Needs Sacred text;Prayer  Stress Factors  Patient Stress Factors Major life changes  Ch was rounding. Pt was up and moving. Pt is in a distressing situation as her brother was diagnosed with kidney cancer recently. Pt is waiting to be transferred to [facility] or [place] but was rejected an admission yesterday. Pt said the nurse will try to get her in again today but the waiting is putting much emotional strain on her. Pt shared that her family is going through a lot with her brother's recent diagnosis but also with her father who is dealing with some mental conditions. Pt relies a lot on faith saying that God will care for her. Ch read the Scripture with the patient and prayed for the pt upon her request. Pt is also experiencing pain as she is trying to pass the camera pill out of her system. Pt benefits a lot from emotional and spiritual support.

## 2018-11-26 NOTE — Discharge Summary (Addendum)
[facility] NAME: Joanna Hall    MR#:  [id number]  DATE OF BIRTH:  09-Jan-1966  DATE OF ADMISSION:  11/19/2018 ADMITTING PHYSICIAN: Nicholes Mango, MD  DATE OF DISCHARGE: 11/25/18   PRIMARY CARE PHYSICIAN: Arnetha Courser, MD    ADMISSION DIAGNOSIS:  GI Bleed  DISCHARGE DIAGNOSIS:  Active Problems:   GI bleed   SECONDARY DIAGNOSIS:   Past Medical History:  Diagnosis Date  . Abdominal pain 06/10/2018  . Abnormal cervical Papanicolaou smear 09/18/2017  . Anxiety   . Aortic atherosclerosis ([place])   . Arthritis    neck and knees  . 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 Blood transfusion without reported diagnosis   . Cervical cancer ([place]) 09/2016   mets lung  . Chronic anal fissure   . Chronic diarrhea   . Dyspnea   . Erosive gastropathy 09/18/2017  . Factor V Leiden mutation ([place])   . Fecal incontinence   . Genital warts   . GERD (gastroesophageal reflux disease)   . Heart murmur   . Hematochezia   . Hemorrhoids   . 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 Hepatitis, chronic ([place]) 05/05/2017  . History of cancer chemotherapy    completed 06/2017  . History of Clostridium difficile infection    while undergoing chemo.  Negative test 10/2017  . Ileocolic anastomotic leak   . 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 Intestinal infection due to Clostridium difficile 09/18/2017  . Macrocytic anemia with vitamin B12 deficiency   . Multiple gastric ulcers   . Nausea vomiting and diarrhea   . Pancolitis ([place]) 07/27/2018  . Perianal condylomata   . Pneumonia    History of  . Pulmonary nodules   . Rectal bleeding   . Small bowel obstruction ([place]) 08/2017  . Stiff neck    limited right turn  . Vitamin D deficiency     HOSPITAL COURSE:  HPI  Joanna Hall  is a 53 y.o. female with a known history of GERD, gastric ulcers, stage IV cervical cancer last  chemotherapy on 09/30/2018, history of intermittent episodes of diarrhea which has been progressively getting worse and has been experiencing watery diarrhea 10 times a day, was on Xarelto which was discontinued on Sunday he is seen at Dr. Kem Parkinson office today.  Patient was complaining of dizziness and GI bleed, hemoglobin was at 10.0 and hospitalist team is called and patient was sent to the hospital as a direct admission.  Patient is reporting right lower quadrant abdominal pain radiating to the left side of the lower abdomen associated with dizziness denies any nausea vomiting  Acute lower GI bleed with history of multiple GI ulcers in the past- hemoglobin has been stable.  -s/p flex sig on 2/6-mostly unremarkable except for erythematous mucosa in the rectum treated with argon plasma coagulation.   Video capsule study done but capsule got stuck in the right lower quadrant per KUB and CT scan with no obstruction -GI following- recommend -GI and oncology Dr. Mike Gip recommended to transfer the patient to [facility] by Dr. Margretta Sidle refused the transfer as patient does not meet inpatient criteria Patient is clinically doing fine okay to discharge patient from GI standpoint if she tolerates soft diet -Continue PPI -Xarelto to be resumed if hemoglobin is stable both GI and oncology are agreeable -  Monitor daily H&H, hemoglobin 9.9-10.3  Nausea and vomiting intractable n.p.o., IV fluids and antiemetics supportive treatment and PPI  Stage IV cervical cancer on chemotherapy -Oncology following.  Patient to follow-up with Dr. Mike Gip as an outpatient after discharge  Hypomagnesemia- mag remains low -Repleted  Acute on chronic diarrhea- improved -C. Diff negative  Chronic pancytopenia- likely related to chemo -Oncology following  History of hepatitis C -Needs to follow-up with gastroenterology at [facility]  ?  Adrenal insufficiency- cortisol level has been low as an  outpatient -Continue home hydrocortisone -Needs to follow-up with endocrinology as an outpatient   Discharge patient home.  Patient is agreeable DISCHARGE CONDITIONS:   Failure  CONSULTS OBTAINED:  Treatment Team:  [name], MD Lequita Asal, MD Jonathon Bellows, MD   PROCEDURES flex sigmoidoscopy video capsule endoscopy  DRUG ALLERGIES:   Allergies  Allergen Reactions  . Ketamine Anxiety and Other (See Comments)    Syncope episode/confusion     DISCHARGE MEDICATIONS:   Allergies as of 11/26/2018      Reactions   Ketamine Anxiety, Other (See Comments)   Syncope episode/confusion      Medication List    TAKE these medications   amitriptyline 75 MG tablet Commonly known as:  ELAVIL Take 1 tablet (75 mg total) by mouth at bedtime.   budesonide 3 MG 24 hr capsule Commonly known as:  ENTOCORT EC Take 3 capsules (9 mg total) by mouth daily.   CALCIUM 500 +D 500-400 MG-UNIT Tabs Generic drug:  Calcium Carb-Cholecalciferol Take 2 tablets by mouth daily.   diphenoxylate-atropine 2.5-0.025 MG tablet Commonly known as:  LOMOTIL Take 2 tablets by mouth 4 (four) times daily.   famotidine 20 MG tablet Commonly known as:  PEPCID Take 1 tablet (20 mg total) by mouth 2 (two) times daily as needed for heartburn or indigestion.   feeding supplement (ENSURE ENLIVE) Liqd Take 237 mLs by mouth 2 (two) times daily between meals. Start taking on:  November 27, 2018   hydrocortisone 10 MG tablet Commonly known as:  CORTEF Take 1 tablet (10 mg total) by mouth 2 (two) times daily.   hydrocortisone 25 MG suppository Commonly known as:  ANUSOL-HC Place 1 suppository (25 mg total) rectally 2 (two) times daily.   hyoscyamine 0.375 MG 12 hr tablet Commonly known as:  LEVBID Take 1 tablet (0.375 mg total) by mouth 2 (two) times daily.   multivitamin with minerals tablet Take 1 tablet by mouth daily.   octreotide 20 MG injection Commonly known as:   SANDOSTATIN LAR DEPOT Inject 20 mg into the muscle every 28 (twenty-eight) days.   ondansetron 8 MG tablet Commonly known as:  ZOFRAN Take 1 tablet (8 mg total) by mouth every 8 (eight) hours as needed for nausea or vomiting. What changed:  Another medication with the same name was added. Make sure you understand how and when to take each.   ondansetron 4 MG tablet Commonly known as:  ZOFRAN Take 1 tablet (4 mg total) by mouth every 6 (six) hours as needed for nausea. What changed:  You were already taking a medication with the same name, and this prescription was added. Make sure you understand how and when to take each.   ondansetron 4 MG/2ML Soln injection Commonly known as:  ZOFRAN Inject 2 mLs (4 mg total) into the vein every 6 (six) hours as needed for nausea. What changed:  You were already taking a medication with the same name, and this prescription was added.  Make sure you understand how and when to take each.   Oxycodone HCl 10 MG Tabs Take 1 tablet (10 mg total) by mouth every 6 (six) hours as needed for moderate pain or breakthrough pain. What changed:    medication strength  how much to take  reasons to take this   pantoprazole 40 MG tablet Commonly known as:  PROTONIX Take 1 tablet (40 mg total) by mouth daily.   polyethylene glycol packet Commonly known as:  MIRALAX / GLYCOLAX Take 17 g by mouth daily as needed.   potassium chloride SA 20 MEQ tablet Commonly known as:  K-DUR,KLOR-CON Take 1 tablet (20 mEq total) by mouth daily. for 4 days.   promethazine 25 MG tablet Commonly known as:  PHENERGAN Take 1 tablet (25 mg total) by mouth every 6 (six) hours as needed for nausea or vomiting.   VENTOLIN HFA 108 (90 Base) MCG/ACT inhaler Generic drug:  albuterol Inhale 1-2 puffs into the lungs every 4 (four) hours as needed for shortness of breath.   VIBERZI 100 MG Tabs Generic drug:  Eluxadoline TAKE 1 TABLET BY MOUTH TWICE DAILY WITH  A  MEAL         DISCHARGE INSTRUCTIONS:   Follow-up with primary care physician after discharge in 3 days Follow-up with Dr. Mike Gip oncologist in 1 week following discharge Follow-up with [facility] gastro[facility]ology ASAP in 2 to 3 days Follow-up with Dr. Marius Ditch in 2 weeks  DIET:  npo  DISCHARGE CONDITION:  Fair  ACTIVITY:  Activity as tolerated  OXYGEN:  Home Oxygen: No.   Oxygen Delivery: room air  DISCHARGE LOCATION:  Tertiary care center  If you experience worsening of your admission symptoms, develop shortness of breath, life threatening emergency, suicidal or homicidal thoughts you must seek medical attention immediately by calling 911 or calling your MD immediately  if symptoms less severe.  You Must read complete instructions/literature along with all the possible adverse reactions/side effects for all the Medicines you take and that have been prescribed to you. Take any new Medicines after you have completely understood and accpet all the possible adverse reactions/side effects.   Please note  You were cared for by a hospitalist during your hospital stay. If you have any questions about your discharge medications or the care you received while you were in the hospital after you are discharged, you can call the unit and asked to speak with the hospitalist on call if the hospitalist that took care of you is not available. Once you are discharged, your primary care physician will handle any further medical issues. Please note that NO REFILLS for any discharge medications will be authorized once you are discharged, as it is imperative that you return to your primary care physician (or establish a relationship with a primary care physician if you do not have one) for your aftercare needs so that they can reassess your need for medications and monitor your lab values.     Today  No chief complaint on file.  Patient is having intractable nausea and vomiting reporting abdominal pain video  capsule still got stuck in her stomach  ROS:  CONSTITUTIONAL: Denies fevers, chills. Denies any fatigue, weakness.  EYES: Denies blurry vision, double vision, eye pain. EARS, NOSE, THROAT: Denies tinnitus, ear pain, hearing loss. RESPIRATORY: Denies cough, wheeze, shortness of breath.  CARDIOVASCULAR: Denies chest pain, palpitations, edema.  GASTROINTESTINAL: Reporting nausea, vomiting, right-sided abdominal pain. Denies bright red blood per rectum.  Denies diarrhea today GENITOURINARY: Denies dysuria,  hematuria. ENDOCRINE: Denies nocturia or thyroid problems. HEMATOLOGIC AND LYMPHATIC: Denies easy bruising or bleeding. SKIN: Denies rash or lesion. MUSCULOSKELETAL: Denies pain in neck, back, shoulder, knees, hips or arthritic symptoms.  NEUROLOGIC: Denies paralysis, paresthesias.  PSYCHIATRIC: Denies anxiety or depressive symptoms.   VITAL SIGNS:  Blood pressure 105/74, pulse 62, temperature (!) 97.5 F (36.4 C), temperature source Oral, resp. rate 16, height 5' 8"  (1.727 m), weight 80.7 kg, SpO2 99 %.  I/O:    Intake/Output Summary (Last 24 hours) at 11/26/2018 1550 Last data filed at 11/26/2018 0600 Gross per 24 hour  Intake 1086.44 ml  Output -  Net 1086.44 ml    PHYSICAL EXAMINATION:  GENERAL:  53 y.o.-year-old patient lying in the bed with no acute distress.  EYES: Pupils equal, round, reactive to light and accommodation. No scleral icterus. Extraocular muscles intact.  HEENT: Head atraumatic, normocephalic. Oropharynx and nasopharynx clear.  NECK:  Supple, no jugular venous distention. No thyroid enlargement, no tenderness.  LUNGS: Normal breath sounds bilaterally, no wheezing, rales,rhonchi or crepitation. No use of accessory muscles of respiration.  CARDIOVASCULAR: S1, S2 normal. No murmurs, rubs, or gallops.  ABDOMEN: Soft, non-tender, non-distended. Bowel sounds present. EXTREMITIES: No pedal edema, cyanosis, or clubbing.  NEUROLOGIC: Cranial nerves II through XII  are intact. Muscle strength 5/5 in all extremities. Sensation intact. Gait not checked.  PSYCHIATRIC: The patient is alert and oriented x 3.  SKIN: No obvious rash, lesion, or ulcer.   DATA REVIEW:   CBC Recent Labs  Lab 11/26/18 0531  WBC 2.6*  HGB 10.2*  HCT 31.3*  PLT 147*    Chemistries  Recent Labs  Lab 11/20/18 0515  11/23/18 0549 11/25/18 1204  NA 136   < > 135  --   K 4.3   < > 4.0  --   CL 106   < > 103  --   CO2 27   < > 24  --   GLUCOSE 94   < > 99  --   BUN 9   < > 12  --   CREATININE 0.73   < > 0.67  --   CALCIUM 8.5*   < > 8.4*  --   MG 1.7   < > 1.5* 1.4*  AST 37  --   --   --   ALT 44  --   --   --   ALKPHOS 114  --   --   --   BILITOT 0.3  --   --   --    < > = values in this interval not displayed.    Cardiac Enzymes Recent Labs  Lab 11/20/18 2227  TROPONINI <0.03    Microbiology Results  Results for orders placed or performed during the hospital encounter of 11/19/18  C difficile quick scan w PCR reflex     Status: None   Collection Time: 11/20/18 10:29 AM  Result Value Ref Range Status   C Diff antigen NEGATIVE NEGATIVE Final   C Diff toxin NEGATIVE NEGATIVE Final   C Diff interpretation No C. difficile detected.  Final    Comment: Performed at [facility], [address] [place], [place] [address]    RADIOLOGY:  Bea Graff 1 View  Result Date: 11/26/2018 CLINICAL DATA:  53 year old female. Evaluate for retained capsule. Subsequent encounter. EXAM: ABDOMEN - 1 VIEW COMPARISON:  11/25/2018. FINDINGS: Radiopaque foreign body right lower quadrant of the abdomen consistent with bowel camera. This has shifted position from  the prior examination now projecting at the level of the cecum/terminal ileum level. Post cholecystectomy. Decreased amount of stool compared to yesterday's exam. Scoliosis lumbar spine with superimposed degenerative changes. IMPRESSION: Radiopaque foreign body right lower quadrant of the abdomen consistent with  bowel camera. This has shifted position from the prior examination now projecting at the level of the cecum/terminal ileum level. Electronically Signed   By: Genia Del M.D.   On: 11/26/2018 13:00   Dg Abd 1 View  Result Date: 11/25/2018 CLINICAL DATA:  Intermittent episodes of diarrhea, progressively worsening. Dizziness and GI bleed, sent to hospital as a direct admission. RIGHT lower quadrant abdominal pain radiating to LEFT side of lower abdomen. Known history of GERD, gastric ulcers, stage IV cervical cancer with last chemotherapy on 09/30/2018. EXAM: ABDOMEN - 1 VIEW COMPARISON:  None. FINDINGS: Overall bowel gas pattern is nonobstructive. No dilated small bowel loops seen. Fairly large amount of stool throughout the large bowel with associated mild distention of the RIGHT colon/transverse colon. Metallic object again projects over the RIGHT lower abdomen, presumed bowel camera. Cholecystectomy clips in the RIGHT upper quadrant. No evidence of soft tissue mass or abnormal fluid collection. No evidence of free intraperitoneal air. No evidence of renal or ureteral calculi. Degenerative changes within the scoliotic lumbar spine, moderate in degree. No acute appearing osseous abnormality. IMPRESSION: 1. Nonobstructive bowel gas pattern. Fairly large amount of stool throughout the large bowel with associated mild distention of the RIGHT colon/transverse colon. 2. Presumed bowel camera within the RIGHT lower quadrant. Electronically Signed   By: Franki Cabot M.D.   On: 11/25/2018 12:48   Ct Abdomen Pelvis W Contrast  Result Date: 11/26/2018 CLINICAL DATA:  Lower GI bleeding. EXAM: CT ABDOMEN AND PELVIS WITH CONTRAST TECHNIQUE: Multidetector CT imaging of the abdomen and pelvis was performed using the standard protocol following bolus administration of intravenous contrast. CONTRAST:  172m OMNIPAQUE IOHEXOL 300 MG/ML  SOLN COMPARISON:  KUB from earlier today. Chest CT 04/15/2018. Abdomen and pelvis CT  10/17/2018 FINDINGS: Lower chest: Numerous bilateral pulmonary nodules are identified and similar to multiple prior studies. Index 11 mm nodule identified right lower lobe on 03/04. Index nodule left lower lobe on 02/04 measures 7 mm. No pleural effusion. Hepatobiliary: No suspicious focal abnormality within the liver parenchyma. Gallbladder surgically absent. Trace intrahepatic biliary duct dilatation evident. Extrahepatic common duct measures 13 mm diameter. Common bile duct in the head of the pancreas is 9 mm diameter. Pancreas: No focal mass lesion. No dilatation of the main duct. No intraparenchymal cyst. No peripancreatic edema. Spleen: No splenomegaly. No focal mass lesion. Adrenals/Urinary Tract: No adrenal nodule or mass. Tiny nonobstructing renal stones noted bilaterally (coronal image 53/5 right kidney and coronal image 60/5 left kidney). No evidence for hydroureter. The urinary bladder appears normal for the degree of distention. Stomach/Bowel: Stomach is unremarkable. No gastric wall thickening. No evidence of outlet obstruction. Duodenum is normally positioned as is the ligament of Treitz. No small bowel wall thickening. No small bowel dilatation. Patient appears to be status post right colectomy. Capsule endoscope is identified in the region of the ileocolic anastomosis. No bowel dilatation proximal to the level of the capsule. Wall thickening noted in the sigmoid colon with the similar appearance of the stranding around the sigmoid colon. Vascular/Lymphatic: There is abdominal aortic atherosclerosis without aneurysm. There is no gastrohepatic or hepatoduodenal ligament lymphadenopathy. No intraperitoneal or retroperitoneal lymphadenopathy. Small retroperitoneal lymph nodes are stable. No pelvic sidewall lymphadenopathy. Reproductive: Stable appearance of the uterus  and cervix. Other: No substantial intraperitoneal free fluid Musculoskeletal: Edema noted in the pelvic floor, as before. No worrisome  lytic or sclerotic osseous abnormality. IMPRESSION: 1. Status post right hemicolectomy. Capsule endoscope noted in the region of the ileocolic anastomosis. Difficult to definitively localize the capsule due to streak artifact from the metallic components, but it does appear distal to the anastomosis. No evidence for small bowel obstruction. 2. Persistent mild wall thickening in the sigmoid colon with edema/stranding in the surrounding fat in soft tissues of the pelvic floor. Imaging features likely treatment related in this patient with a history of cervical cancer. 3. Stable intra and extrahepatic biliary duct dilatation in this patient status post cholecystectomy. 4. Stable appearance of numerous bilateral pulmonary nodules consistent with metastatic disease. 5.  Aortic Atherosclerois (ICD10-170.0) Electronically Signed   By: Misty Stanley M.D.   On: 11/26/2018 14:08    EKG:   Orders placed or performed during the hospital encounter of 11/19/18  . EKG 12-Lead  . EKG 12-Lead      Management plans discussed with the patient, she is in agreement.  CODE STATUS:     Code Status Orders  (From admission, onward)         Start     Ordered   11/19/18 1804  Full code  Continuous     11/19/18 1803        Code Status History    Date Active Date Inactive Code Status Order ID Comments User Context   10/17/2018 1910 10/18/2018 2119 Full Code [id number]  Hillary Bow, MD Inpatient   10/08/2018 1443 10/11/2018 1920 Full Code [id number]  Nicholes Mango, MD Inpatient   07/27/2018 1621 08/01/2018 1432 Full Code [id number]  Demetrios Loll, MD Inpatient   06/10/2018 1754 06/12/2018 1614 Full Code [id number]  Dustin Flock, MD Inpatient   04/24/2018 1451 04/27/2018 1700 Full Code [id number]  Lovell Sheehan, MD Inpatient   12/03/2017 1619 12/08/2017 1606 Full Code [id number]  Epifanio Lesches, MD ED   09/12/2017 0024 09/15/2017 1721 Full Code [id number]  [name], MD ED   08/27/2017 0223 09/05/2017 1355 Full  Code [id number]  Herbert Pun, MD Inpatient    Advance Directive Documentation     Most Recent Value  Type of Advance Directive  Healthcare Power of Attorney  Pre-existing out of facility DNR order (yellow form or pink MOST form)  -  "MOST" Form in Place?  -      TOTAL TIME TAKING CARE OF THIS PATIENT: 43  minutes.   Note: This dictation was prepared with Dragon dictation along with smaller phrase technology. Any transcriptional errors that result from this process are unintentional.   @[facility] @  on 11/26/2018 at 3:50 PM  Between 7am to 6pm - Pager - [phone]  After 6pm go to [email or web address] - password [email or web address] [facility]  Office  [phone]  CC: Primary care physician; Arnetha Courser, MD

## 2018-11-27 ENCOUNTER — Encounter: Payer: Self-pay | Admitting: Gastroenterology

## 2018-11-27 ENCOUNTER — Encounter: Payer: Self-pay | Admitting: Urgent Care

## 2018-11-28 ENCOUNTER — Telehealth: Payer: Self-pay

## 2018-11-28 NOTE — Telephone Encounter (Signed)
Transition Care Management Follow-up Telephone Call  Date of discharge and from where: 11/26/18 [facility]  How have you been since you were released from the hospital? Pt states she is feeling better today, weak yesterday  Any questions or concerns? No   Items Reviewed:  Did the pt receive and understand the discharge instructions provided? Yes   Medications obtained and verified? Yes  pain medication increased and all diarrhea medication stopped except for lomotil  Any new allergies since your discharge? No   Dietary orders reviewed? Yes  Do you have support at home? Yes   Functional Questionnaire: (I = Independent and D = Dependent) ADLs: I  Bathing/Dressing- I  Meal Prep- I  Eating- I  Maintaining continence- I  Transferring/Ambulation- I  Managing Meds- I  Follow up appointments reviewed:   PCP Hospital f/u appt confirmed? Yes  Scheduled to see Dr. Sanda Klein on 12/02/18 @ 2:00.  [facility] f/u appt confirmed? Yes  Scheduled to see [facility] on 12/03/18.  Are transportation arrangements needed? No   If their condition worsens, is the pt aware to call PCP or go to the Emergency Dept.? Yes  Was the patient provided with contact information for the PCP's office or ED? Yes  Was to pt encouraged to call back with questions or concerns? Yes

## 2018-11-29 ENCOUNTER — Encounter: Payer: Self-pay | Admitting: Urgent Care

## 2018-11-29 ENCOUNTER — Inpatient Hospital Stay: Payer: Medicaid Other | Admitting: Hematology and Oncology

## 2018-11-29 NOTE — Progress Notes (Deleted)
[facility] day:  11/29/2018   Chief Complaint: Joanna Hall is a 53 y.o. female with stage IV cervical cancer who is seen for assessment following recent hospital discharge.  HPI:  The patient was last seen in the medical oncology clinic on 11/19/2018.  At that time, she had ongoing diarrhea.  She had increased RLQ pain with cramping.  She had significant hematochezia.  She was weak and lightheaded.  Exam revealed tenderness in the RLQ without guarding or rebound tenderness.  She was admitted to the hospital.  She was admitted to [facility] from 11/19/2018 - 02/10/2020with a GI bleed.  Patient had serial hemoglobins which were stable.  Flexible sigmoidoscopy on 11/21/2018 revealed erythematous mucosa in the rectum treated with argon plasma coagulation. Video capsule studygot stuck in the right lower quadrant per KUB and CT scan with no obstruction.  Capsule was felt distal to the anastomosis.  She was seen by Dr Marius Ditch. She had ordered stool osmolality twice, however this was not processed due to reported solid stool sample by the lab.  She noted that she had takend Dulcolax at home.  She was advised her against using laxatives because she could be laxative dependent.  She was advised her to use MiraLAX or Colace only.  She recommended not restarting the rest of her outpatient antidiarrheal medications.  She had followed her for several month and did not have weight loss or protein-calorie malnutrition despite having chronic diarrhea which made it unlikely malabsorptive or inflammatory or secretory etiology.  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 She was to be referred for anorectal manometry as an outpatient.  Attempt was made to transfer the patient to [facility].  Dr. Margretta Sidle refused the transfer as patient does not meet inpatient criteria.  She was tolerating a soft diet at discharge.  Xarelto was  restarted.  During the interim,    Past Medical History:  Diagnosis Date  . Abdominal pain 06/10/2018  . Abnormal cervical Papanicolaou smear 09/18/2017  . Anxiety   . Aortic atherosclerosis ([place])   . Arthritis    neck and knees  . 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 Blood transfusion without reported diagnosis   . Cervical cancer ([place]) 09/2016   mets lung  . Chronic anal fissure   . Chronic diarrhea   . Dyspnea   . Erosive gastropathy 09/18/2017  . Factor V Leiden mutation ([place])   . Fecal incontinence   . Genital warts   . GERD (gastroesophageal reflux disease)   . Heart murmur   . Hematochezia   . Hemorrhoids   . 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 Hepatitis, chronic ([place]) 05/05/2017  . History of cancer chemotherapy    completed 06/2017  . History of Clostridium difficile infection    while undergoing chemo.  Negative test 10/2017  . Ileocolic anastomotic leak   . 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 Intestinal infection due to Clostridium difficile 09/18/2017  . Macrocytic anemia with vitamin B12 deficiency   . Multiple gastric ulcers   . Nausea vomiting and diarrhea   . Pancolitis ([place]) 07/27/2018  . Perianal condylomata   . Pneumonia    History of  . Pulmonary nodules   . Rectal bleeding   . Small bowel obstruction ([place]) 08/2017  . Stiff  neck    limited right turn  . Vitamin D deficiency     Past Surgical History:  Procedure Laterality Date  . CHOLECYSTECTOMY    . COLON SURGERY  08/2017   resection  . COLONOSCOPY WITH PROPOFOL N/A 12/20/2017   Procedure: COLONOSCOPY WITH PROPOFOL;  Surgeon: [name], MD;  Location: [facility] ENDOSCOPY;  Service: Gastroenterology;  Laterality: N/A;  . COLONOSCOPY WITH PROPOFOL N/A 07/30/2018   Procedure: COLONOSCOPY WITH PROPOFOL;  Surgeon: [name], MD;  Location: [facility] ENDOSCOPY;  Service: Gastroenterology;  Laterality: N/A;  . COLONOSCOPY  WITH PROPOFOL N/A 10/10/2018   Procedure: COLONOSCOPY WITH PROPOFOL;  Surgeon: Lucilla Lame, MD;  Location: [facility] ENDOSCOPY;  Service: Endoscopy;  Laterality: N/A;  . DIAGNOSTIC LAPAROSCOPY    . ESOPHAGOGASTRODUODENOSCOPY (EGD) WITH PROPOFOL N/A 12/20/2017   Procedure: ESOPHAGOGASTRODUODENOSCOPY (EGD) WITH PROPOFOL;  Surgeon: [name], MD;  Location: [place];  Service: Gastroenterology;  Laterality: N/A;  . ESOPHAGOGASTRODUODENOSCOPY (EGD) WITH PROPOFOL  07/30/2018   Procedure: ESOPHAGOGASTRODUODENOSCOPY (EGD) WITH PROPOFOL;  Surgeon: [name], MD;  Location: [facility] ENDOSCOPY;  Service: Gastroenterology;;  . Otho Darner SIGMOIDOSCOPY N/A 11/21/2018   Procedure: FLEXIBLE SIGMOIDOSCOPY;  Surgeon: [name], MD;  Location: [facility] ENDOSCOPY;  Service: Gastroenterology;  Laterality: N/A;  . LAPAROTOMY N/A 08/31/2017   Procedure: EXPLORATORY LAPAROTOMY for SBO, ileocolectomy, removal of piece of uterine wall;  Surgeon: [name], MD;  Location: [facility] ORS;  Service: General;  Laterality: N/A;  . LASER ABLATION CONDOLAMATA N/A 02/22/2018   Procedure: LASER ABLATION/REMOVAL OF BJYNWGNFAOZ AROUND ANUS AND VAGINA;  Surgeon: Michael Boston, MD;  Location: [place];  Service: General;  Laterality: N/A;  . OOPHORECTOMY    . PORTA CATH INSERTION N/A 05/13/2018   Procedure: PORTA CATH INSERTION;  Surgeon: Katha Cabal, MD;  Location: [place] CV LAB;  Service: Cardiovascular;  Laterality: N/A;  . SMALL INTESTINE SURGERY    . TANDEM RING INSERTION     x3  . THORACOTOMY    . TOTAL KNEE ARTHROPLASTY Left 04/24/2018   Procedure: TOTAL KNEE ARTHROPLASTY;  Surgeon: Lovell Sheehan, MD;  Location: [facility] ORS;  Service: Orthopedics;  Laterality: Left;    Family History  Problem Relation Age of Onset  . Hypertension Father   . Diabetes Father   . Alcohol abuse Daughter   . Hypertension Maternal Grandmother   . Diabetes Maternal Grandmother   . Diabetes  Paternal Grandmother   . Hypertension Paternal Grandmother     Social History:  reports that she quit smoking about 12 years ago. She has never used smokeless tobacco. She reports previous alcohol use. She reports current drug use. Drug: Marijuana.  The patient's 98 year old daughter died of alcoholism.  The patient is originally from [place].  She moved from [place] to [place]. She is accompanied by her sister, April, today.   Allergies:  Allergies  Allergen Reactions  . Ketamine Anxiety and Other (See Comments)    Syncope episode/confusion     Current Medications: Current Outpatient Medications  Medication Sig Dispense Refill  . amitriptyline (ELAVIL) 75 MG tablet Take 1 tablet (75 mg total) by mouth at bedtime. 90 tablet 1  . budesonide (ENTOCORT EC) 3 MG 24 hr capsule Take 3 capsules (9 mg total) by mouth daily. 30 capsule 0  . Calcium Carb-Cholecalciferol (CALCIUM 500 +D) 500-400 MG-UNIT TABS Take 2 tablets by mouth daily.    . diphenoxylate-atropine (LOMOTIL) 2.5-0.025 MG tablet Take 2 tablets by mouth 4 (  four) times daily. 30 tablet 0  . famotidine (PEPCID) 20 MG tablet Take 1 tablet (20 mg total) by mouth 2 (two) times daily as needed for heartburn or indigestion.    . feeding supplement, ENSURE ENLIVE, (ENSURE ENLIVE) LIQD Take 237 mLs by mouth 2 (two) times daily between meals. 237 mL 12  . hydrocortisone (ANUSOL-HC) 25 MG suppository Place 1 suppository (25 mg total) rectally 2 (two) times daily. (Patient not taking: Reported on 11/28/2018) 12 suppository 0  . hydrocortisone (CORTEF) 10 MG tablet Take 1 tablet (10 mg total) by mouth 2 (two) times daily. 180 tablet 0  . hyoscyamine (LEVBID) 0.375 MG 12 hr tablet Take 1 tablet (0.375 mg total) by mouth 2 (two) times daily. (Patient not taking: Reported on 11/28/2018) 60 tablet 0  . Multiple Vitamins-Minerals (MULTIVITAMIN WITH MINERALS) tablet Take 1 tablet by mouth daily. 30 tablet 0  . octreotide (SANDOSTATIN  LAR DEPOT) 20 MG injection Inject 20 mg into the muscle every 28 (twenty-eight) days. 1 each 0  . ondansetron (ZOFRAN) 4 MG tablet Take 1 tablet (4 mg total) by mouth every 6 (six) hours as needed for nausea. 20 tablet 0  . ondansetron (ZOFRAN) 4 MG/2ML SOLN injection Inject 2 mLs (4 mg total) into the vein every 6 (six) hours as needed for nausea. (Patient not taking: Reported on 11/28/2018) 2 mL 0  . ondansetron (ZOFRAN) 8 MG tablet Take 1 tablet (8 mg total) by mouth every 8 (eight) hours as needed for nausea or vomiting. (Patient not taking: Reported on 11/28/2018) 30 tablet 1  . oxyCODONE 10 MG TABS Take 1 tablet (10 mg total) by mouth every 6 (six) hours as needed for moderate pain or breakthrough pain. 20 tablet 0  . pantoprazole (PROTONIX) 40 MG tablet Take 1 tablet (40 mg total) by mouth daily. 90 tablet 0  . polyethylene glycol (MIRALAX / GLYCOLAX) packet Take 17 g by mouth daily as needed. 14 each 0  . potassium chloride SA (K-DUR,KLOR-CON) 20 MEQ tablet Take 1 tablet (20 mEq total) by mouth daily. for 4 days. 30 tablet 0  . promethazine (PHENERGAN) 25 MG tablet Take 1 tablet (25 mg total) by mouth every 6 (six) hours as needed for nausea or vomiting. 30 tablet 0  . VENTOLIN HFA 108 (90 Base) MCG/ACT inhaler Inhale 1-2 puffs into the lungs every 4 (four) hours as needed for shortness of breath.   0  . VIBERZI 100 MG TABS TAKE 1 TABLET BY MOUTH TWICE DAILY WITH  A  MEAL 60 tablet 0   No current facility-administered medications for this visit.    Facility-Administered Medications Ordered in Other Visits  Medication Dose Route Frequency Provider Last Rate Last Dose  . heparin lock flush 100 unit/mL  500 Units Intravenous Once ,  C, MD      . sodium chloride 0.9 % 1,000 mL with potassium chloride 20 mEq infusion   Intravenous Once Karen Kitchens, NP         Review of Systems  Constitutional: Positive for malaise/fatigue and weight loss (7 pounds). Negative for chills,  diaphoresis and fever.       Feels "so so".  HENT: Negative.  Negative for congestion, ear discharge, ear pain, nosebleeds, sinus pain, sore throat and tinnitus.        S/p full dental extraction.  Dentures planned 4-5 weeks after extractions.  Eyes: Negative.  Negative for blurred vision, double vision, photophobia and pain.  Respiratory: Positive for shortness of breath (  exertional). Negative for cough, hemoptysis, sputum production and wheezing.   Cardiovascular: Negative.  Negative for chest pain, palpitations, orthopnea, leg swelling and PND.  Gastrointestinal: Positive for abdominal pain (chronic lower; increased pain RLQ), blood in stool (significant blood in toilet today) and diarrhea (continues). Negative for constipation, melena, nausea and vomiting.  Genitourinary: Negative.  Negative for dysuria, frequency, hematuria and urgency.       No vaginal bleeding.  Musculoskeletal: Negative.  Negative for back pain, falls, joint pain and neck pain.  Skin: Negative.  Negative for itching and rash.  Neurological: Positive for weakness (general). Negative for dizziness, tingling, tremors, sensory change, speech change, focal weakness and headaches.  Endo/Heme/Allergies: Negative.  Does not bruise/bleed easily.  Psychiatric/Behavioral: Negative.  Negative for depression and memory loss. The patient does not have insomnia.   All other systems reviewed and are negative.  Performance status (ECOG): 1-2  Vital Signs There were no vitals taken for this visit.  Physical Exam  Constitutional: She is oriented to person, place, and time. Vital signs are normal.  Non-toxic appearance. No distress.  Chronically fatigued appearing woman sitting in the exam room in no acute distress.  HENT:  Head: Normocephalic and atraumatic. Hair is normal (chemotherapy induced alopecia).  Mouth/Throat: Oropharynx is clear and moist and mucous membranes are normal. No oropharyngeal exudate.  Wearing a pink wrap.   Eyes: Pupils are equal, round, and reactive to light. Conjunctivae and EOM are normal. No scleral icterus.  Neck: Normal range of motion. Neck supple. No JVD present.  Cardiovascular: Normal rate, regular rhythm, normal heart sounds and intact distal pulses. Exam reveals no gallop and no friction rub.  No murmur heard. Pulmonary/Chest: Effort normal and breath sounds normal. No respiratory distress. She has no wheezes. She has no rales.  Abdominal: Soft. Bowel sounds are normal. She exhibits no distension and no mass. There is no hepatosplenomegaly. There is abdominal tenderness (increased) in the right lower quadrant. There is no rebound and no guarding.  Musculoskeletal: Normal range of motion.        General: No tenderness or edema.  Lymphadenopathy:    She has no cervical adenopathy.    She has no axillary adenopathy.       Right: No supraclavicular adenopathy present.       Left: No supraclavicular adenopathy present.  Neurological: She is alert and oriented to person, place, and time. Gait normal.  Skin: Skin is warm and dry. No rash noted. She is not diaphoretic. No erythema. No pallor.  Psychiatric: Mood, affect and judgment normal.  Nursing note and vitals reviewed.   Imaging studies: 06/2017:  PET scan revealed enlarging pulmonary nodules but no evidence of abdominal disease per patient report. 09/13/2017:  Abdomen and pelvic CT revealed a small infarct in the inferior pole of the LEFT kidney and RIGHT uterine infarct. 11/08/2017:  PET scan at the [facility] revealed innumerable stable bilateral cavitary pulmonary nodules (up to 8 mm), some of which demonstrate low level metabolic activity.  There were post treatment changes in the pelvis without evidence of suspicious hypermetabolic activity. 12/03/2017:  Abdomen and pelvic CT revealed continued multiple pulmonary nodules in the lung bases concerning for metastatic disease.  There was mild wall thickening  of rectosigmoid colon is noted.  There was a stable 1.8 x 1.6 cm mixed fat and soft tissue density noted in right lower quadrant which may represent fat necrosis.  There was a larger low density is noted involving uterine fundus.  12/03/2017:  Pelvic MRI was unremarkable.   04/15/2018:  Chest, abdomen, and pelvis CT revealed innumerable (> 100) cavitary nodules scattered in the lungs, moderately enlarging compared to the 11/08/2017 PET-CT, suspicious for metastatic disease.  One index node in the RLL measures 1.0 x 1.1 cm (previously 0.6 x 0.6 cm).  There were no new nodules.  There was an ill-defined wall thickening in the rectosigmoid with surrounding stranding along fascia planes, probably sequela from prior radiation therapy.  There was multilevel lumbar impingement due to spondylosis and degenerative disc disease.  There was heterogeneous enhancement in the uterus (some possibly from prior radiation therapy). 04/23/2018:  PET scan revealed numerous scattered solid and cavitary nodules in the lungs stable increased in size compared to the prior PET-CT from 11/08/2017. Largest nodule was 1.1 cm in the LUL (SUV 1.9).  These demonstrated low-grade metabolic activity up to a maximum SUV of about 2.3, increased from 11/08/2017.  05/23/2018:  Abdomen and pelvic CT revealed colonic wall thickening, consistent with colitis. Considerations include inflammatory or infectious causes. Ischemic causes would be less likely. She is s/p RIGHT hemicolectomy. She had a patent ileocolic anastomosis.  She received a 10 day course of ciprofloxacin and Flagyl.   09/07/2018:  Abdomen and pelvic CT angiogram revealed colitis, mainly affecting lower loops, question radiation colitis if there has been radiotherapy of the patient's cervical cancer.  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  09/30/2018:  Chest CT revealed interval decrease in size and number of metastatic pulmonary nodules.  There were no new/progressive findings in the  chest or upper abdomen   No visits with results within 3 Day(s) from this visit.  Latest known visit with results is:  Admission on 11/19/2018, Discharged on 11/26/2018  Component Date Value Ref Range Status  . Hemoglobin 11/19/2018 9.5* 12.0 - 15.0 g/dL Final  . HCT 11/19/2018 29.5* 36.0 - 46.0 % Final   Performed at [facility], [address] [place], [place] [address]  . Hemoglobin 11/20/2018 9.6* 12.0 - 15.0 g/dL Final  . HCT 11/20/2018 29.8* 36.0 - 46.0 % Final   Performed at [facility], [address] [place], [place] [address]  . C Diff antigen 11/20/2018 NEGATIVE  NEGATIVE Final  . C Diff toxin 11/20/2018 NEGATIVE  NEGATIVE Final  . C Diff interpretation 11/20/2018 No C. difficile detected.   Final   Performed at [facility], [address], [place], [place] [address]  . Sodium 11/20/2018 136  135 - 145 mmol/L Final  . Potassium 11/20/2018 4.3  3.5 - 5.1 mmol/L Final  . Chloride 11/20/2018 106  98 - 111 mmol/L Final  . CO2 11/20/2018 27  22 - 32 mmol/L Final  . Glucose, Bld 11/20/2018 94  70 - 99 mg/dL Final  . BUN 11/20/2018 9  6 - 20 mg/dL Final  . Creatinine, Ser 11/20/2018 0.73  0.44 - 1.00 mg/dL Final  . Calcium 11/20/2018 8.5* 8.9 - 10.3 mg/dL Final  . Total Protein 11/20/2018 6.9  6.5 - 8.1 g/dL Final  . Albumin 11/20/2018 3.0* 3.5 - 5.0 g/dL Final  . AST 11/20/2018 37  15 - 41 U/L Final  . ALT 11/20/2018 44  0 - 44 U/L Final  . Alkaline Phosphatase 11/20/2018 114  38 - 126 U/L Final  . Total Bilirubin 11/20/2018 0.3  0.3 - 1.2 mg/dL Final  . GFR calc non Af Amer 11/20/2018 >60  >60 mL/min Final  . GFR calc Af Amer 11/20/2018 >60  >60 mL/min  Final  . Anion gap 11/20/2018 3* 5 - 15 Final   Performed at [facility], [address] [place], [place] [address]  . WBC 11/20/2018 2.4* 4.0 - 10.5 K/uL Final  . RBC 11/20/2018 2.84* 3.87 - 5.11 MIL/uL Final  . Hemoglobin 11/20/2018 9.9* 12.0 - 15.0 g/dL Final  . HCT  11/20/2018 30.7* 36.0 - 46.0 % Final  . MCV 11/20/2018 108.1* 80.0 - 100.0 fL Final  . MCH 11/20/2018 34.9* 26.0 - 34.0 pg Final  . MCHC 11/20/2018 32.2  30.0 - 36.0 g/dL Final  . RDW 11/20/2018 14.5  11.5 - 15.5 % Final  . Platelets 11/20/2018 124* 150 - 400 K/uL Final   Comment: Immature Platelet Fraction may be clinically indicated, consider ordering this additional test [id number]   . nRBC 11/20/2018 0.0  0.0 - 0.2 % Final   Performed at [facility], [address] [place], [place] [address]  . ABO/RH(D) 11/20/2018 O POS   Final  . Antibody Screen 11/20/2018 NEG   Final  . Sample Expiration 11/20/2018    Final                   Value:11/23/2018 Performed at [facility], [address], [place], [place] [address]   . Magnesium 11/20/2018 1.7  1.7 - 2.4 mg/dL Final   Performed at [facility], [address] [place], [place] [address]  . WBC 11/21/2018 2.4* 4.0 - 10.5 K/uL Final  . RBC 11/21/2018 2.95* 3.87 - 5.11 MIL/uL Final  . Hemoglobin 11/21/2018 10.2* 12.0 - 15.0 g/dL Final  . HCT 11/21/2018 31.3* 36.0 - 46.0 % Final  . MCV 11/21/2018 106.1* 80.0 - 100.0 fL Final  . MCH 11/21/2018 34.6* 26.0 - 34.0 pg Final  . MCHC 11/21/2018 32.6  30.0 - 36.0 g/dL Final  . RDW 11/21/2018 14.2  11.5 - 15.5 % Final  . Platelets 11/21/2018 133* 150 - 400 K/uL Final  . nRBC 11/21/2018 0.0  0.0 - 0.2 % Final   Performed at [facility], [address], [place], [place] [address]  . Sodium 11/21/2018 135  135 - 145 mmol/L Final  . Potassium 11/21/2018 3.9  3.5 - 5.1 mmol/L Final  . Chloride 11/21/2018 103  98 - 111 mmol/L Final  . CO2 11/21/2018 28  22 - 32 mmol/L Final  . Glucose, Bld 11/21/2018 93  70 - 99 mg/dL Final  . BUN 11/21/2018 7  6 - 20 mg/dL Final  . Creatinine, Ser 11/21/2018 0.74  0.44 - 1.00 mg/dL Final  . Calcium 11/21/2018 8.6* 8.9 - 10.3 mg/dL Final  . GFR calc non Af Amer 11/21/2018 >60  >60 mL/min Final  . GFR calc Af Amer  11/21/2018 >60  >60 mL/min Final  . Anion gap 11/21/2018 4* 5 - 15 Final   Performed at [facility], [address], [place], [place] [address]  . Magnesium 11/21/2018 1.6* 1.7 - 2.4 mg/dL Final   Performed at [facility], [address] [place], [place] [address]  . Osmolality,Stl 11/20/2018 FOS  mOsmol/kg Final   Comment: (NOTE) Fecal specimen too solid to determine osmolality. Specimen must be watery to assay in osmometer.      Deloris Ping notified 11/22/2018 Performed At: [facility] [place], [place] [id number] Rush Farmer MD [id number]   . Sodium 11/20/2018 134* 135 - 145 mmol/L Final   Performed at [facility], [address] [place],  [address]  .  Potassium 11/20/2018 3.7  3.5 - 5.1 mmol/L Final   Performed at [facility], [address] [place], [place] [address]  . Magnesium 11/20/2018 1.5* 1.7 - 2.4 mg/dL Final   Performed at [facility], [address] [place], [place] [address]  . Phosphorus 11/20/2018 3.7  2.5 - 4.6 mg/dL Final   Performed at [facility], [address] [place], [place] [address]  . IgG (Immunoglobin G), Serum 11/20/2018 2,236* 700 - 1,600 mg/dL Final  . IgA 11/20/2018 206  87 - 352 mg/dL Final  . IgM (Immunoglobulin M), Srm 11/20/2018 78  26 - 217 mg/dL Final  . IgE (Immunoglobulin E), Serum 11/20/2018 34  6 - 495 IU/mL Final   Comment: (NOTE) Performed At: [facility] [place], [place] [id number] Rush Farmer MD TZ:[id number]   . Troponin I 11/20/2018 <0.03  <0.03 ng/mL Final   Performed at [facility], [address] [place], [place] [address]  . SURGICAL PATHOLOGY 11/21/2018    Final-Edited                   Value:Surgical Pathology **** THIS IS AN ADDENDUM REPORT **** CASE: [id number] PATIENT: [place] Surgical Pathology Report **********Addendum **********  Reason for Addendum #1:   Immunohistochemistry results  SPECIMEN SUBMITTED: A. Colon, random; cbx B. Rectum, erythema; cbx  CLINICAL HISTORY: Metastatic cervical cancer who is admitted from the medical oncology clinic with rectal bleeding.  PRE-OPERATIVE DIAGNOSIS: Rectal bleed, diarrhea  POST-OPERATIVE DIAGNOSIS: Internal hemorrhoids; rectal erythema, radiation proctitis     DIAGNOSIS: A.  COLON, RANDOM; COLD BIOPSY: - COLONIC MUCOSA WITH FOCAL MILD ACTIVE COLITIS. - FOCAL ISCHEMIC CHANGE. - NEGATIVE FOR DYSPLASIA AND MALIGNANCY. - TRICHROME AND IHC FOR CMV AND HSV WILL BE REPORTED IN AN ADDENDUM.  B. RECTAL ERYTHEMA; COLD BIOPSY: - COLONIC MUCOSA WITH FOCAL ISCHEMIC CHANGE AND CHANGES CONSISTENT WITH PRIOR RADIATION. - NEGATIVE FOR DYSPLASIA AND MALIGNANCY. - CONGO RED AND IHC FOR CMV AND HSV                          WILL BE REPORTED IN AN ADDENDUM.  GROSS DESCRIPTION: A. Labeled: C BX random colon for chronic diarrhea Received: Formalin Tissue fragment(s): Multiple Size: Aggregate, 0.9 x 0.2 x 0.1 cm Description: Tan soft tissue fragments Entirely submitted in 1 cassette.  B. Labeled: C BX rectum due to erythema Received: Formalin Tissue fragment(s): 2 Size: 0.2 cm Description: Tan soft tissue fragments Entirely submitted in 1 cassette.   Final Diagnosis performed by Quay Burow, MD.   Electronically signed 11/22/2018 9:04:33AM The electronic signature indicates that the named Attending Pathologist has evaluated the specimen  Technical component performed at [facility], [address] [place], [place] [address] Lab: [phone] Dir: Rush Farmer, MD, MMM  Professional component performed at [facility], [address], [place], [place],  [address] Lab: [phone] Dir: Dellia Nims. Rubinas, MD  ADDENDUM: A trichrome stain fails to identify subepith                         elial collagen deposition. CMV and HSVI/II stains are negative.  [place]ongo red stain for  amyloid is negative.  IHC slides were prepared by [facility], [place], . All controls stained appropriately.  This test was developed and its performance characteristics determined by [facility]. It has not been cleared or approved by the [place] Food  and Drug Administration. The FDA does not require this test to go through premarket FDA review. This test is used for clinical purposes. It should not be regarded as investigational or for research. This laboratory is certified under the Clinical Laboratory Improvement Amendments (CLIA) as qualified to perform high complexity clinical laboratory testing.         Addendum #1 performed by Quay Burow, MD.   Electronically signed 11/25/2018 3:50:53PM The electronic signature indicates that the named Attending Pathologist has evaluated the specimen  Technical component performed at [facility], [address]                         [place], [place], [place] [address] Lab: [phone] Dir: Rush Farmer, MD, MMM  Professional component performed at [facility], [facility], [place], [place], [place] [address] Lab: [phone] Dir: Dellia Nims. Rubinas, MD   . WBC 11/22/2018 3.9* 4.0 - 10.5 K/uL Final  . RBC 11/22/2018 2.90* 3.87 - 5.11 MIL/uL Final  . Hemoglobin 11/22/2018 10.1* 12.0 - 15.0 g/dL Final  . HCT 11/22/2018 31.2* 36.0 - 46.0 % Final  . MCV 11/22/2018 107.6* 80.0 - 100.0 fL Final  . MCH 11/22/2018 34.8* 26.0 - 34.0 pg Final  . MCHC 11/22/2018 32.4  30.0 - 36.0 g/dL Final  . RDW 11/22/2018 14.0  11.5 - 15.5 % Final  . Platelets 11/22/2018 135* 150 - 400 K/uL Final  . nRBC 11/22/2018 0.0  0.0 - 0.2 % Final   Performed at [facility], [address], [place], [place] [address]  . Sodium 11/22/2018 135  135 - 145 mmol/L Final  . Potassium 11/22/2018 3.8  3.5 - 5.1 mmol/L Final  . Chloride 11/22/2018 104  98 - 111 mmol/L Final  . CO2 11/22/2018 28  22 - 32 mmol/L Final  .  Glucose, Bld 11/22/2018 100* 70 - 99 mg/dL Final  . BUN 11/22/2018 8  6 - 20 mg/dL Final  . Creatinine, Ser 11/22/2018 0.69  0.44 - 1.00 mg/dL Final  . Calcium 11/22/2018 8.4* 8.9 - 10.3 mg/dL Final  . GFR calc non Af Amer 11/22/2018 >60  >60 mL/min Final  . GFR calc Af Amer 11/22/2018 >60  >60 mL/min Final  . Anion gap 11/22/2018 3* 5 - 15 Final   Performed at [facility], [address], [place], [place] [address]  . Sodium 11/23/2018 135  135 - 145 mmol/L Final  . Potassium 11/23/2018 4.0  3.5 - 5.1 mmol/L Final  . Chloride 11/23/2018 103  98 - 111 mmol/L Final  . CO2 11/23/2018 24  22 - 32 mmol/L Final  . Glucose, Bld 11/23/2018 99  70 - 99 mg/dL Final  . BUN 11/23/2018 12  6 - 20 mg/dL Final  . Creatinine, Ser 11/23/2018 0.67  0.44 - 1.00 mg/dL Final  . Calcium 11/23/2018 8.4* 8.9 - 10.3 mg/dL Final  . GFR calc non Af Amer 11/23/2018 >60  >60 mL/min Final  . GFR calc Af Amer 11/23/2018 >60  >60 mL/min Final  . Anion gap 11/23/2018 8  5 - 15 Final   Performed at [facility], [address], [place], [place] [address]  . WBC 11/23/2018 2.9* 4.0 - 10.5 K/uL Final  . RBC 11/23/2018 2.83* 3.87 - 5.11 MIL/uL Final  . Hemoglobin 11/23/2018 9.9* 12.0 - 15.0 g/dL Final  . HCT 11/23/2018 30.7* 36.0 - 46.0 % Final  . MCV 11/23/2018 108.5* 80.0 - 100.0 fL Final  .  MCH 11/23/2018 35.0* 26.0 - 34.0 pg Final  . MCHC 11/23/2018 32.2  30.0 - 36.0 g/dL Final  . RDW 11/23/2018 14.0  11.5 - 15.5 % Final  . Platelets 11/23/2018 131* 150 - 400 K/uL Final  . nRBC 11/23/2018 0.0  0.0 - 0.2 % Final   Performed at [facility], [address] [place], [place] [address]  . Magnesium 11/23/2018 1.5* 1.7 - 2.4 mg/dL Final   Performed at [facility], [address] [place], [place] [address]  . Osmolality,Stl 11/23/2018 FOS  mOsmol/kg Final   Comment: (NOTE) Fecal specimen too solid to determine osmolality. Specimen must be watery to assay in osmometer.       Rosana Fret notified 11/26/2018 Performed At: [facility] [place], [place] [id number] Rush Farmer MD OY:[id number]   . WBC 11/24/2018 3.2* 4.0 - 10.5 K/uL Final  . RBC 11/24/2018 2.96* 3.87 - 5.11 MIL/uL Final  . Hemoglobin 11/24/2018 10.3* 12.0 - 15.0 g/dL Final  . HCT 11/24/2018 31.3* 36.0 - 46.0 % Final  . MCV 11/24/2018 105.7* 80.0 - 100.0 fL Final  . MCH 11/24/2018 34.8* 26.0 - 34.0 pg Final  . MCHC 11/24/2018 32.9  30.0 - 36.0 g/dL Final  . RDW 11/24/2018 13.7  11.5 - 15.5 % Final  . Platelets 11/24/2018 141* 150 - 400 K/uL Final  . nRBC 11/24/2018 0.0  0.0 - 0.2 % Final   Performed at [facility], [address] [place], [place] [address]  . WBC 11/25/2018 3.2* 4.0 - 10.5 K/uL Final  . RBC 11/25/2018 3.05* 3.87 - 5.11 MIL/uL Final  . Hemoglobin 11/25/2018 10.8* 12.0 - 15.0 g/dL Final  . HCT 11/25/2018 32.3* 36.0 - 46.0 % Final  . MCV 11/25/2018 105.9* 80.0 - 100.0 fL Final  . MCH 11/25/2018 35.4* 26.0 - 34.0 pg Final  . MCHC 11/25/2018 33.4  30.0 - 36.0 g/dL Final  . RDW 11/25/2018 13.7  11.5 - 15.5 % Final  . Platelets 11/25/2018 148* 150 - 400 K/uL Final  . nRBC 11/25/2018 0.0  0.0 - 0.2 % Final   Performed at [facility], [address] [place], [place] [address]  . Magnesium 11/25/2018 1.4* 1.7 - 2.4 mg/dL Final   Performed at [facility], [address] [place], [place] [address]  . WBC 11/26/2018 2.6* 4.0 - 10.5 K/uL Final  . RBC 11/26/2018 2.95* 3.87 - 5.11 MIL/uL Final  . Hemoglobin 11/26/2018 10.2* 12.0 - 15.0 g/dL Final  . HCT 11/26/2018 31.3* 36.0 - 46.0 % Final  . MCV 11/26/2018 106.1* 80.0 - 100.0 fL Final  . MCH 11/26/2018 34.6* 26.0 - 34.0 pg Final  . MCHC 11/26/2018 32.6  30.0 - 36.0 g/dL Final  . RDW 11/26/2018 13.7  11.5 - 15.5 % Final  . Platelets 11/26/2018 147* 150 - 400 K/uL Final  . nRBC 11/26/2018 0.0  0.0 - 0.2 % Final   Performed at [facility], [address]  [place], [place] [address]    Assessment:  CINA KLUMPP is a 53 y.o. female with a history of stage IB1 cervical cancer s/p concurrent cisplatin and radiation (external beam and brachytherapy) from 11/2016 - 02/2017.    She was treated by Dr. Christene Slates at [facility] in [place], [place].  In 01/2017, she received cisplatin x 2 and carboplatin x 1 (01/29/2017) due to ARF and XRT. Radiation was followed by T & O on 02/01/2017 and T &  N on 02/10/2017 and 02/20/2017.  Course was complicated by weight loss (80 pounds), nausea, vomiting, electrolyte wasting (potassium and magnesium), and multiple hospitalizations.  PET scan in 06/2017 revealed enlarging pulmonary nodules but no evidence of abdominal disease per patient report.  She is s/p right ileocolectomy on 08/31/2017 for a small bowel obstruction.  She presented with abdominal pain.  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 PDL-1 testing reveals a CPS score of 15.  Chest, abdomen, and pelvis CT on 04/15/2018 revealed innumerable (> 100) cavitary nodules scattered in the lungs, moderately enlarging compared to the 11/08/2017 PET-CT, suspicious for metastatic disease. One index node in the RLL measures 1.0 x 1.1 cm.  There were no new nodules.  There was an ill-defined wall thickening in the rectosigmoid with surrounding stranding along fascia planes, probably sequela from prior radiation therapy.  There was multilevel lumbar impingement due to spondylosis and degenerative disc disease.  There was heterogeneous enhancement in the uterus (some possibly from prior radiation therapy).  PET scan on 04/23/2018 revealed numerous scattered solid and cavitary nodules in the lungs stable increased in size compared to the prior PET-CT from 11/08/2017. Largest nodule was 1.1 cm in the LUL (SUV  1.9).  These demonstrated low-grade metabolic activity up to a maximum SUV of about 2.3, increased from 11/08/2017.   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  She is day 36 s/p cycle # 5 cycle of carboplatin and Taxol (06/04/2018 - 09/30/2018) with Margarette Canada support.    Chest CT on 09/30/2018 revealed interval decrease in size and number of metastatic pulmonary nodules.  There were no new/progressive findings in the chest or upper abdomen  Hypercoagulable work-up on 09/14/2017 revealed heterozygosity for Factor V Leiden (single [id number] mutation).  Testing was negative for prothrombin gene mutation, lupus anticoagulant panel, anticardiolipin antibodies, and beta-2 glycoprotein antibodies.  She was discharged on Xarelto.  She was admitted to [facility] from 12/03/2017 - 12/08/2017 with abdominal pain. She was not felt to have a uterine infarct.  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  The etiology was unclear.  She was admitted to [facility] from 10/08/2018 - 10/11/2018 for rectal bleeding. Hemoglobin dropped from 13 to 8.9 in 4 days. Platelets had decreased to 81,000; chronic anticoagulation (Xarelto) was held. She received 2 units of PRBCs. Repeat stool studies were negative for diarrhea of infectious etiology. Colonoscopy on 10/10/2018 revealed 3 ulcers in the terminal ileum (no evidence of dysplasia or malignancy). Throughout the course of her admission, platelet count was monitored (dropped as low as 61,000).   She was admitted to [facility] from 10/17/2018 - 10/18/2018 at [facility] abdominal pain, nausea and vomiting.  Abdomen and pelvis CT suggested a partial small bowel obstruction.  She was seen by general surgery and had no evidence of bowel obstruction clinically as she had a bowel movement was passing flatus and had a benign abdomen.  She is hepatitis C positive.   Hepatitis C genotype is  2a/2c.  She has B12 deficiency.  B12 was 165 on 11/27/2017.  She began B12 injections on 11/29/2017.  B12 injections occur monthly at home.  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  She has right knee pain and wishes to have surgery when feasible.  She was diagnosed with colitis.  Abdomen and pelvic  CT on 05/23/2018 revealed colonic wall thickening, consistent with colitis. Considerations include inflammatory or infectious causes. Ischemic causes would be less likely. She is s/p RIGHT hemicolectomy. She had a patent ileocolic anastomosis.  She received a 10 day course of ciprofloxacin and Flagyl.  Abdomen and pelvic CT angiogram on 09/07/2018 revealed colitis, mainly affecting lower loops, question radiation colitis if there has been radiotherapy of the patient's cervical cancer.  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   She has  chronic diarrhea s/p right hemicolectomy.  She began sandostatin 20 mg IM q month on 10/22/2018.  She began Levbid on 10/23/2018.   EGDon10/15/2019revealed an erythematous mucosa in the cardia, bilious gastric fluid, and LA grade A reflux esophagitis.  She has adrenal insufficiency and is on hydrocortisone.  She underwent full dental extraction on 11/05/2018.  Symptomatically, she has ongoing diarrhea.  She has increased RLQ pain with cramping.  She has had significant hematochezia today.  She is weak and lightheaded.  Exam reveals tenderness in the RLQ without guarding or rebound tenderness.  Plan: 1. Labs today: CBC with diff, BMP, Mg 2. Metastatic cervical cancer Day 44 s/p last cycle of carboplatin + paclitaxel with Neulasta support. Concern again addressed regarding reinitiation of chemotherapy with ongoing GI issues. 3. Pancytopenia Hematocrit 36.0 and hemoglobin 11.8. Platelets 142,000.  WBC 2600 (ANC 1800). Rivaroxaban on hold since 11/17/2018.  Continue to hold Xarelto secondary to rectal bleeding. 4. Vaginal bleeding  No further  bleeding.  Gyn-Onc noted vaginal atrophy with bleeding on contact on 10/23/2018. 5. Chronic diarrhea and rectal bleeding Last colonoscopy on 10/10/2018 revealed 3 ulcers noted at the terminal ileum.  Pathology was negative for high grade dysplasia or malignancy.  Concern continues for possible ulcerative colitis or Crohn's disease. Patient notes return of significant GI bleeding today. Patient has new RLQ pain. Contact GI (Dr Marius Ditch)- done. Check orthostatics.  IVF and recheck hematocrit/hemoglobin.  Suspect hemoglobin falsely elevated. Check stool for C diff. Patient has appointment with [facility] in 06/2019 (long wait time).  [facility] will be contacted. Discuss possible admission to [facility] secondary to bleeding. 6. Electrolyte derangements Weight loss of 7 pounds.  Ongoing diarrhea and fluid losses. Check orthostatics. IVF 1 liter NS + 3 gm magnesium today. BUN 14 and creatinine 0.88.  Potassium 3.8.  Magnesium  1.5. 7.  IVF followed by recheck of orthostatics. 8.  Recheck CBC after IVF. 9.  Discuss possible admission to [facility] secondary to rectal bleeding and increased RLQ pain.  Addendum:  CBC after 1 liter IVF reveals a hematocrit of 30.6, hemoglobin 10.0, platelets 121,000, WBC 3300.  INR is pending.   Lequita Asal, MD  11/29/2018, 6:15 AM

## 2018-12-02 ENCOUNTER — Inpatient Hospital Stay: Payer: Medicaid Other | Admitting: Family Medicine

## 2018-12-02 ENCOUNTER — Other Ambulatory Visit: Payer: Self-pay | Admitting: Urgent Care

## 2018-12-02 MED ORDER — PROMETHAZINE HCL 25 MG PO TABS: 25.0000 mg | ORAL_TABLET | Freq: Four times a day (QID) | ORAL | 2 refills | Status: DC | PRN

## 2018-12-03 ENCOUNTER — Encounter: Payer: Self-pay | Admitting: Urgent Care

## 2018-12-04 ENCOUNTER — Inpatient Hospital Stay: Payer: Medicaid Other | Admitting: Hematology and Oncology

## 2018-12-04 ENCOUNTER — Inpatient Hospital Stay: Payer: Medicaid Other

## 2018-12-04 ENCOUNTER — Other Ambulatory Visit: Payer: Self-pay | Admitting: Urgent Care

## 2018-12-04 ENCOUNTER — Encounter: Payer: Self-pay | Admitting: Urgent Care

## 2018-12-04 NOTE — Progress Notes (Deleted)
[facility] day:  12/04/2018   Chief Complaint: Joanna Hall is a 53 y.o. female with stage IV cervical cancer who is seen for assessment following recent hospital discharge.  HPI:  The patient was last seen in the medical oncology clinic on 11/19/2018.  At that time, she had ongoing diarrhea.  She had increased RLQ pain with cramping.  She had significant hematochezia.  She was weak and lightheaded.  Exam revealed tenderness in the RLQ without guarding or rebound tenderness.  She was admitted to the hospital.  She was admitted to [facility] from 11/19/2018 - 02/10/2020with a GI bleed.  Patient had serial hemoglobins which were stable.  Flexible sigmoidoscopy on 11/21/2018 revealed erythematous mucosa in the rectum treated with argon plasma coagulation. Video capsule studygot stuck in the right lower quadrant per KUB and CT scan with no obstruction.  Capsule was felt distal to the anastomosis.  She was seen by Dr Marius Ditch. She had ordered stool osmolality twice, however this was not processed due to reported solid stool sample by the lab.  She noted that she had takend Dulcolax at home.  She was advised her against using laxatives because she could be laxative dependent.  She was advised her to use MiraLAX or Colace only.  She recommended not restarting the rest of her outpatient antidiarrheal medications.  She had followed her for several month and did not have weight loss or protein-calorie malnutrition despite having chronic diarrhea which made it unlikely malabsorptive or inflammatory or secretory etiology.  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 She was to be referred for anorectal manometry as an outpatient.  Attempt was made to transfer the patient to [facility].  Dr. Margretta Sidle refused the transfer as patient does not meet inpatient criteria.  She was tolerating a soft diet at discharge.  Xarelto was  restarted.  She was seen by Dr. Efraim Kaufmann at [facility] on 12/03/2018.  Noted reviewed.  I spoke to him on 12/03/2018.  He was highly suspicious of small intestine bacterial overgrowth. She has a history of an ileocectomy which would increase her risk. He discussed formal testing for SIBO vs an empiric trial of rifaximin. Given the low risks of this medication, decision was made to pursue an empiric trial if approved by insurance. Should her symptoms persist despite therapy, additional testing was discussed. Given the severity of her diarrhea, he felt it would be ideal to see sustained improvement prior to resuming chemotherapy that could exacerbate her diarrhea.   During the interim,    Past Medical History:  Diagnosis Date  . Abdominal pain 06/10/2018  . Abnormal cervical Papanicolaou smear 09/18/2017  . Anxiety   . Aortic atherosclerosis ([place])   . Arthritis    neck and knees  . 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 Blood transfusion without reported diagnosis   . Cervical cancer ([place]) 09/2016   mets lung  . Chronic anal fissure   . Chronic diarrhea   . Dyspnea   . Erosive gastropathy 09/18/2017  . Factor V Leiden mutation ([place])   . Fecal incontinence   . Genital warts   . GERD (gastroesophageal reflux disease)   . Heart murmur   . Hematochezia   . Hemorrhoids   . 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 Hepatitis, chronic ([place]) 05/05/2017  . History of  cancer chemotherapy    completed 06/2017  . History of Clostridium difficile infection    while undergoing chemo.  Negative test 10/2017  . Ileocolic anastomotic leak   . 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 Intestinal infection due to Clostridium difficile 09/18/2017  . Macrocytic anemia with vitamin B12 deficiency   . Multiple gastric ulcers   . Nausea vomiting and diarrhea   . Pancolitis ([place]) 07/27/2018  . Perianal condylomata   . Pneumonia    History of  .  Pulmonary nodules   . Rectal bleeding   . Small bowel obstruction ([place]) 08/2017  . Stiff neck    limited right turn  . Vitamin D deficiency     Past Surgical History:  Procedure Laterality Date  . CHOLECYSTECTOMY    . COLON SURGERY  08/2017   resection  . COLONOSCOPY WITH PROPOFOL N/A 12/20/2017   Procedure: COLONOSCOPY WITH PROPOFOL;  Surgeon: [name], MD;  Location[facility] ENDOSCOPY;  Service: Gastroenterology;  Laterality: N/A;  . COLONOSCOPY WITH PROPOFOL N/A 07/30/2018   Procedure: COLONOSCOPY WITH PROPOFOL;  Surgeon: [name], MD;  Location: [facility] ENDOSCOPY;  Service: Gastroenterology;  Laterality: N/A;  . COLONOSCOPY WITH PROPOFOL N/A 10/10/2018   Procedure: COLONOSCOPY WITH PROPOFOL;  Surgeon: Lucilla Lame, MD;  Location: [facility] ENDOSCOPY;  Service: Endoscopy;  Laterality: N/A;  . DIAGNOSTIC LAPAROSCOPY    . ESOPHAGOGASTRODUODENOSCOPY (EGD) WITH PROPOFOL N/A 12/20/2017   Procedure: ESOPHAGOGASTRODUODENOSCOPY (EGD) WITH PROPOFOL;  Surgeon: [name], MD;  Location: [place];  Service: Gastroenterology;  Laterality: N/A;  . ESOPHAGOGASTRODUODENOSCOPY (EGD) WITH PROPOFOL  07/30/2018   Procedure: ESOPHAGOGASTRODUODENOSCOPY (EGD) WITH PROPOFOL;  Surgeon: [name], MD;  Location: [facility] ENDOSCOPY;  Service: Gastroenterology;;  . Otho Darner SIGMOIDOSCOPY N/A 11/21/2018   Procedure: FLEXIBLE SIGMOIDOSCOPY;  Surgeon: [name], MD;  Location: [facility] ENDOSCOPY;  Service: Gastroenterology;  Laterality: N/A;  . LAPAROTOMY N/A 08/31/2017   Procedure: EXPLORATORY LAPAROTOMY for SBO, ileocolectomy, removal of piece of uterine wall;  Surgeon: [name], MD;  Location: [facility] ORS;  Service: General;  Laterality: N/A;  . LASER ABLATION CONDOLAMATA N/A 02/22/2018   Procedure: LASER ABLATION/REMOVAL OF JOACZYSAYTK AROUND ANUS AND VAGINA;  Surgeon: Michael Boston, MD;  Location: [place];  Service: General;  Laterality: N/A;  .  OOPHORECTOMY    . PORTA CATH INSERTION N/A 05/13/2018   Procedure: PORTA CATH INSERTION;  Surgeon: Katha Cabal, MD;  Location: [place] CV LAB;  Service: Cardiovascular;  Laterality: N/A;  . SMALL INTESTINE SURGERY    . TANDEM RING INSERTION     x3  . THORACOTOMY    . TOTAL KNEE ARTHROPLASTY Left 04/24/2018   Procedure: TOTAL KNEE ARTHROPLASTY;  Surgeon: Lovell Sheehan, MD;  Location: [facility] ORS;  Service: Orthopedics;  Laterality: Left;    Family History  Problem Relation Age of Onset  . Hypertension Father   . Diabetes Father   . Alcohol abuse Daughter   . Hypertension Maternal Grandmother   . Diabetes Maternal Grandmother   . Diabetes Paternal Grandmother   . Hypertension Paternal Grandmother     Social History:  reports that she quit smoking about 12 years ago. She has never used smokeless tobacco. She reports previous alcohol use. She reports current drug use. Drug: Marijuana.  The patient's 72 year old daughter died of alcoholism.  The patient is originally from [place].  She moved from [place] to [place]. She is accompanied by her  sister, April, today.   Allergies:  Allergies  Allergen Reactions  . Ketamine Anxiety and Other (See Comments)    Syncope episode/confusion     Current Medications: Current Outpatient Medications  Medication Sig Dispense Refill  . amitriptyline (ELAVIL) 75 MG tablet Take 1 tablet (75 mg total) by mouth at bedtime. 90 tablet 1  . budesonide (ENTOCORT EC) 3 MG 24 hr capsule Take 3 capsules (9 mg total) by mouth daily. 30 capsule 0  . Calcium Carb-Cholecalciferol (CALCIUM 500 +D) 500-400 MG-UNIT TABS Take 2 tablets by mouth daily.    . diphenoxylate-atropine (LOMOTIL) 2.5-0.025 MG tablet Take 2 tablets by mouth 4 (four) times daily. 30 tablet 0  . famotidine (PEPCID) 20 MG tablet Take 1 tablet (20 mg total) by mouth 2 (two) times daily as needed for heartburn or indigestion.    . feeding supplement, ENSURE ENLIVE,  (ENSURE ENLIVE) LIQD Take 237 mLs by mouth 2 (two) times daily between meals. 237 mL 12  . hydrocortisone (ANUSOL-HC) 25 MG suppository Place 1 suppository (25 mg total) rectally 2 (two) times daily. (Patient not taking: Reported on 11/28/2018) 12 suppository 0  . hydrocortisone (CORTEF) 10 MG tablet Take 1 tablet (10 mg total) by mouth 2 (two) times daily. 180 tablet 0  . hyoscyamine (LEVBID) 0.375 MG 12 hr tablet Take 1 tablet (0.375 mg total) by mouth 2 (two) times daily. (Patient not taking: Reported on 11/28/2018) 60 tablet 0  . Multiple Vitamins-Minerals (MULTIVITAMIN WITH MINERALS) tablet Take 1 tablet by mouth daily. 30 tablet 0  . octreotide (SANDOSTATIN LAR DEPOT) 20 MG injection Inject 20 mg into the muscle every 28 (twenty-eight) days. 1 each 0  . ondansetron (ZOFRAN) 4 MG tablet Take 1 tablet (4 mg total) by mouth every 6 (six) hours as needed for nausea. 20 tablet 0  . ondansetron (ZOFRAN) 4 MG/2ML SOLN injection Inject 2 mLs (4 mg total) into the vein every 6 (six) hours as needed for nausea. (Patient not taking: Reported on 11/28/2018) 2 mL 0  . ondansetron (ZOFRAN) 8 MG tablet Take 1 tablet (8 mg total) by mouth every 8 (eight) hours as needed for nausea or vomiting. (Patient not taking: Reported on 11/28/2018) 30 tablet 1  . oxyCODONE 10 MG TABS Take 1 tablet (10 mg total) by mouth every 6 (six) hours as needed for moderate pain or breakthrough pain. 20 tablet 0  . pantoprazole (PROTONIX) 40 MG tablet Take 1 tablet (40 mg total) by mouth daily. 90 tablet 0  . polyethylene glycol (MIRALAX / GLYCOLAX) packet Take 17 g by mouth daily as needed. 14 each 0  . potassium chloride SA (K-DUR,KLOR-CON) 20 MEQ tablet Take 1 tablet (20 mEq total) by mouth daily. for 4 days. 30 tablet 0  . promethazine (PHENERGAN) 25 MG tablet Take 1 tablet (25 mg total) by mouth every 6 (six) hours as needed for nausea or vomiting. 30 tablet 2  . VENTOLIN HFA 108 (90 Base) MCG/ACT inhaler Inhale 1-2 puffs into the  lungs every 4 (four) hours as needed for shortness of breath.   0  . VIBERZI 100 MG TABS TAKE 1 TABLET BY MOUTH TWICE DAILY WITH  A  MEAL 60 tablet 0   No current facility-administered medications for this visit.    Facility-Administered Medications Ordered in Other Visits  Medication Dose Route Frequency Provider Last Rate Last Dose  . heparin lock flush 100 unit/mL  500 Units Intravenous Once Lequita Asal, MD      . sodium  chloride 0.9 % 1,000 mL with potassium chloride 20 mEq infusion   Intravenous Once Karen Kitchens, NP         Review of Systems  Constitutional: Positive for malaise/fatigue and weight loss (7 pounds). Negative for chills, diaphoresis and fever.       Feels "so so".  HENT: Negative.  Negative for congestion, ear discharge, ear pain, nosebleeds, sinus pain, sore throat and tinnitus.        S/p full dental extraction.  Dentures planned 4-5 weeks after extractions.  Eyes: Negative.  Negative for blurred vision, double vision, photophobia and pain.  Respiratory: Positive for shortness of breath (exertional). Negative for cough, hemoptysis, sputum production and wheezing.   Cardiovascular: Negative.  Negative for chest pain, palpitations, orthopnea, leg swelling and PND.  Gastrointestinal: Positive for abdominal pain (chronic lower; increased pain RLQ), blood in stool (significant blood in toilet today) and diarrhea (continues). Negative for constipation, melena, nausea and vomiting.  Genitourinary: Negative.  Negative for dysuria, frequency, hematuria and urgency.       No vaginal bleeding.  Musculoskeletal: Negative.  Negative for back pain, falls, joint pain and neck pain.  Skin: Negative.  Negative for itching and rash.  Neurological: Positive for weakness (general). Negative for dizziness, tingling, tremors, sensory change, speech change, focal weakness and headaches.  Endo/Heme/Allergies: Negative.  Does not bruise/bleed easily.  Psychiatric/Behavioral: Negative.   Negative for depression and memory loss. The patient does not have insomnia.   All other systems reviewed and are negative.  Performance status (ECOG): 1-2  Vital Signs There were no vitals taken for this visit.  Physical Exam  Constitutional: She is oriented to person, place, and time. Vital signs are normal.  Non-toxic appearance. No distress.  Chronically fatigued appearing woman sitting in the exam room in no acute distress.  HENT:  Head: Normocephalic and atraumatic. Hair is normal (chemotherapy induced alopecia).  Mouth/Throat: Oropharynx is clear and moist and mucous membranes are normal. No oropharyngeal exudate.  Wearing a pink wrap.  Eyes: Pupils are equal, round, and reactive to light. Conjunctivae and EOM are normal. No scleral icterus.  Neck: Normal range of motion. Neck supple. No JVD present.  Cardiovascular: Normal rate, regular rhythm, normal heart sounds and intact distal pulses. Exam reveals no gallop and no friction rub.  No murmur heard. Pulmonary/Chest: Effort normal and breath sounds normal. No respiratory distress. She has no wheezes. She has no rales.  Abdominal: Soft. Bowel sounds are normal. She exhibits no distension and no mass. There is no hepatosplenomegaly. There is abdominal tenderness (increased) in the right lower quadrant. There is no rebound and no guarding.  Musculoskeletal: Normal range of motion.        General: No tenderness or edema.  Lymphadenopathy:    She has no cervical adenopathy.    She has no axillary adenopathy.       Right: No supraclavicular adenopathy present.       Left: No supraclavicular adenopathy present.  Neurological: She is alert and oriented to person, place, and time. Gait normal.  Skin: Skin is warm and dry. No rash noted. She is not diaphoretic. No erythema. No pallor.  Psychiatric: Mood, affect and judgment normal.  Nursing note and vitals reviewed.   Imaging studies: 06/2017:  PET scan revealed enlarging pulmonary  nodules but no evidence of abdominal disease per patient report. 09/13/2017:  Abdomen and pelvic CT revealed a small infarct in the inferior pole of the LEFT kidney and RIGHT uterine infarct. 11/08/2017:  PET scan at the [place] [facility] revealed innumerable stable bilateral cavitary pulmonary nodules (up to 8 mm), some of which demonstrate low level metabolic activity.  There were post treatment changes in the pelvis without evidence of suspicious hypermetabolic activity. 12/03/2017:  Abdomen and pelvic CT revealed continued multiple pulmonary nodules in the lung bases concerning for metastatic disease.  There was mild wall thickening of rectosigmoid colon is noted.  There was a stable 1.8 x 1.6 cm mixed fat and soft tissue density noted in right lower quadrant which may represent fat necrosis.  There was a larger low density is noted involving uterine fundus.  12/03/2017:  Pelvic MRI was unremarkable.   04/15/2018:  Chest, abdomen, and pelvis CT revealed innumerable (> 100) cavitary nodules scattered in the lungs, moderately enlarging compared to the 11/08/2017 PET-CT, suspicious for metastatic disease.  One index node in the RLL measures 1.0 x 1.1 cm (previously 0.6 x 0.6 cm).  There were no new nodules.  There was an ill-defined wall thickening in the rectosigmoid with surrounding stranding along fascia planes, probably sequela from prior radiation therapy.  There was multilevel lumbar impingement due to spondylosis and degenerative disc disease.  There was heterogeneous enhancement in the uterus (some possibly from prior radiation therapy). 04/23/2018:  PET scan revealed numerous scattered solid and cavitary nodules in the lungs stable increased in size compared to the prior PET-CT from 11/08/2017. Largest nodule was 1.1 cm in the LUL (SUV 1.9).  These demonstrated low-grade metabolic activity up to a maximum SUV of about 2.3, increased from 11/08/2017.  05/23/2018:  Abdomen and  pelvic CT revealed colonic wall thickening, consistent with colitis. Considerations include inflammatory or infectious causes. Ischemic causes would be less likely. She is s/p RIGHT hemicolectomy. She had a patent ileocolic anastomosis.  She received a 10 day course of ciprofloxacin and Flagyl.   09/07/2018:  Abdomen and pelvic CT angiogram revealed colitis, mainly affecting lower loops, question radiation colitis if there has been radiotherapy of the patient's cervical cancer.  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  09/30/2018:  Chest CT revealed interval decrease in size and number of metastatic pulmonary nodules.  There were no new/progressive findings in the chest or upper abdomen   No visits with results within 3 Day(s) from this visit.  Latest known visit with results is:  Admission on 11/19/2018, Discharged on 11/26/2018  Component Date Value Ref Range Status  . Hemoglobin 11/19/2018 9.5* 12.0 - 15.0 g/dL Final  . HCT 11/19/2018 29.5* 36.0 - 46.0 % Final   Performed at [facility], [address] [place], [place] [address]  . Hemoglobin 11/20/2018 9.6* 12.0 - 15.0 g/dL Final  . HCT 11/20/2018 29.8* 36.0 - 46.0 % Final   Performed at [facility], [address] [place], [place] [address]  . C Diff antigen 11/20/2018 NEGATIVE  NEGATIVE Final  . C Diff toxin 11/20/2018 NEGATIVE  NEGATIVE Final  . C Diff interpretation 11/20/2018 No C. difficile detected.   Final   Performed at [facility], [address], [place],  [address]  . Sodium 11/20/2018 136  135 - 145 mmol/L Final  . Potassium 11/20/2018 4.3  3.5 - 5.1 mmol/L Final  . Chloride 11/20/2018 106  98 - 111 mmol/L Final  . CO2 11/20/2018 27  22 - 32 mmol/L Final  . Glucose, Bld 11/20/2018 94  70 - 99 mg/dL Final  . BUN 11/20/2018 9  6 - 20 mg/dL Final  . Creatinine,  Ser 11/20/2018 0.73  0.44 - 1.00 mg/dL Final  . Calcium 11/20/2018 8.5* 8.9 - 10.3 mg/dL Final  . Total Protein 11/20/2018 6.9   6.5 - 8.1 g/dL Final  . Albumin 11/20/2018 3.0* 3.5 - 5.0 g/dL Final  . AST 11/20/2018 37  15 - 41 U/L Final  . ALT 11/20/2018 44  0 - 44 U/L Final  . Alkaline Phosphatase 11/20/2018 114  38 - 126 U/L Final  . Total Bilirubin 11/20/2018 0.3  0.3 - 1.2 mg/dL Final  . GFR calc non Af Amer 11/20/2018 >60  >60 mL/min Final  . GFR calc Af Amer 11/20/2018 >60  >60 mL/min Final  . Anion gap 11/20/2018 3* 5 - 15 Final   Performed at [facility], [address] [place], [place] [address]  . WBC 11/20/2018 2.4* 4.0 - 10.5 K/uL Final  . RBC 11/20/2018 2.84* 3.87 - 5.11 MIL/uL Final  . Hemoglobin 11/20/2018 9.9* 12.0 - 15.0 g/dL Final  . HCT 11/20/2018 30.7* 36.0 - 46.0 % Final  . MCV 11/20/2018 108.1* 80.0 - 100.0 fL Final  . MCH 11/20/2018 34.9* 26.0 - 34.0 pg Final  . MCHC 11/20/2018 32.2  30.0 - 36.0 g/dL Final  . RDW 11/20/2018 14.5  11.5 - 15.5 % Final  . Platelets 11/20/2018 124* 150 - 400 K/uL Final   Comment: Immature Platelet Fraction may be clinically indicated, consider ordering this additional test [id number]   . nRBC 11/20/2018 0.0  0.0 - 0.2 % Final   Performed at [facility], [address] [place], [place] [address]  . ABO/RH(D) 11/20/2018 O POS   Final  . Antibody Screen 11/20/2018 NEG   Final  . Sample Expiration 11/20/2018    Final                   Value:11/23/2018 Performed at [facility], [address] [place], [place] [address]   . Magnesium 11/20/2018 1.7  1.7 - 2.4 mg/dL Final   Performed at [facility], [address] [place], [place] [address]  . WBC 11/21/2018 2.4* 4.0 - 10.5 K/uL Final  . RBC 11/21/2018 2.95* 3.87 - 5.11 MIL/uL Final  . Hemoglobin 11/21/2018 10.2* 12.0 - 15.0 g/dL Final  . HCT 11/21/2018 31.3* 36.0 - 46.0 % Final  . MCV 11/21/2018 106.1* 80.0 - 100.0 fL Final  . MCH 11/21/2018 34.6* 26.0 - 34.0 pg Final  . MCHC 11/21/2018 32.6  30.0 - 36.0 g/dL Final  . RDW 11/21/2018 14.2  11.5 - 15.5 % Final   . Platelets 11/21/2018 133* 150 - 400 K/uL Final  . nRBC 11/21/2018 0.0  0.0 - 0.2 % Final   Performed at [facility], [address], [place], [place] [address]  . Sodium 11/21/2018 135  135 - 145 mmol/L Final  . Potassium 11/21/2018 3.9  3.5 - 5.1 mmol/L Final  . Chloride 11/21/2018 103  98 - 111 mmol/L Final  . CO2 11/21/2018 28  22 - 32 mmol/L Final  . Glucose, Bld 11/21/2018 93  70 - 99 mg/dL Final  . BUN 11/21/2018 7  6 - 20 mg/dL Final  . Creatinine, Ser 11/21/2018 0.74  0.44 - 1.00 mg/dL Final  . Calcium 11/21/2018 8.6* 8.9 - 10.3 mg/dL Final  . GFR calc non Af Amer 11/21/2018 >60  >60 mL/min Final  . GFR calc Af Amer 11/21/2018 >60  >60 mL/min Final  . Anion gap 11/21/2018 4* 5 - 15 Final   Performed  at [facility], [address] [place], [place] [address]  . Magnesium 11/21/2018 1.6* 1.7 - 2.4 mg/dL Final   Performed at [facility], [address] [place], [place] [address]  . Osmolality,Stl 11/20/2018 FOS  mOsmol/kg Final   Comment: (NOTE) Fecal specimen too solid to determine osmolality. Specimen must be watery to assay in osmometer.      Deloris Ping notified 11/22/2018 Performed At: [facility] [place], [place] [id number] Rush Farmer MD PR:[id number]   . Sodium 11/20/2018 134* 135 - 145 mmol/L Final   Performed at [facility], [address] [place], [place] [address]  . Potassium 11/20/2018 3.7  3.5 - 5.1 mmol/L Final   Performed at [facility], [address] [place], [place] [address]  . Magnesium 11/20/2018 1.5* 1.7 - 2.4 mg/dL Final   Performed at [facility], [address] [place], [place] [address]  . Phosphorus 11/20/2018 3.7  2.5 - 4.6 mg/dL Final   Performed at [facility], [address] [place], [place] [address]  . IgG (Immunoglobin G), Serum 11/20/2018 2,236* 700 - 1,600 mg/dL Final  . IgA 11/20/2018 206  87 - 352 mg/dL Final  . IgM  (Immunoglobulin M), Srm 11/20/2018 78  26 - 217 mg/dL Final  . IgE (Immunoglobulin E), Serum 11/20/2018 34  6 - 495 IU/mL Final   Comment: (NOTE) Performed At: [facility] [place], [place] [id number] Rush Farmer MD [id number]   . Troponin I 11/20/2018 <0.03  <0.03 ng/mL Final   Performed at [facility], [address] [place], [place] [address]  . SURGICAL PATHOLOGY 11/21/2018    Final-Edited                   Value:Surgical Pathology **** THIS IS AN ADDENDUM REPORT **** CASE: [id number] PATIENT: [place] Surgical Pathology Report **********Addendum **********  Reason for Addendum #1:  Immunohistochemistry results  SPECIMEN SUBMITTED: A. Colon, random; cbx B. Rectum, erythema; cbx  CLINICAL HISTORY: Metastatic cervical cancer who is admitted from the medical oncology clinic with rectal bleeding.  PRE-OPERATIVE DIAGNOSIS: Rectal bleed, diarrhea  POST-OPERATIVE DIAGNOSIS: Internal hemorrhoids; rectal erythema, radiation proctitis     DIAGNOSIS: A.  COLON, RANDOM; COLD BIOPSY: - COLONIC MUCOSA WITH FOCAL MILD ACTIVE COLITIS. - FOCAL ISCHEMIC CHANGE. - NEGATIVE FOR DYSPLASIA AND MALIGNANCY. - TRICHROME AND IHC FOR CMV AND HSV WILL BE REPORTED IN AN ADDENDUM.  B. RECTAL ERYTHEMA; COLD BIOPSY: - COLONIC MUCOSA WITH FOCAL ISCHEMIC CHANGE AND CHANGES CONSISTENT WITH PRIOR RADIATION. - NEGATIVE FOR DYSPLASIA AND MALIGNANCY. - CONGO RED AND IHC FOR CMV AND HSV                          WILL BE REPORTED IN AN ADDENDUM.  GROSS DESCRIPTION: A. Labeled: C BX random colon for chronic diarrhea Received: Formalin Tissue fragment(s): Multiple Size: Aggregate, 0.9 x 0.2 x 0.1 cm Description: Tan soft tissue fragments Entirely submitted in 1 cassette.  B. Labeled: C BX rectum due to erythema Received: Formalin Tissue fragment(s): 2 Size: 0.2 cm Description: Tan soft tissue fragments Entirely submitted in 1  cassette.   Final Diagnosis performed by Quay Burow, MD.   Electronically signed 11/22/2018 9:04:33AM The electronic signature indicates that the named Attending Pathologist has evaluated the specimen  Technical component performed at [facility], [address], [place], [place] [address] Lab: [phone] Dir: Rush Farmer, MD, MMM  Professional component  performed at [facility], [facility], [address], [place], [place] [address] Lab: [phone] Dir: Dellia Nims. Rubinas, MD  ADDENDUM: A trichrome stain fails to identify subepith                         elial collagen deposition. CMV and HSVI/II stains are negative.  [place]ongo red stain for amyloid is negative.  IHC slides were prepared by [facility] for Molecular Biology and Pathology, [place], [place]. All controls stained appropriately.  This test was developed and its performance characteristics determined by [facility]. It has not been cleared or approved by the [place] Food and Drug Administration. The FDA does not require this test to go through premarket FDA review. This test is used for clinical purposes. It should not be regarded as investigational or for research. This laboratory is certified under the Clinical Laboratory Improvement Amendments (CLIA) as qualified to perform high complexity clinical laboratory testing.         Addendum #1 performed by Quay Burow, MD.   Electronically signed 11/25/2018 3:50:53PM The electronic signature indicates that the named Attending Pathologist has evaluated the specimen  Technical component performed at [facility], [address]                         [place], [place], [place] [address] Lab: [phone] Dir: Rush Farmer, MD, MMM  Professional component performed at [facility], [facility], [place], [place], [place] [address] Lab: [phone] Dir: Dellia Nims. Rubinas, MD   . WBC 11/22/2018 3.9* 4.0 - 10.5 K/uL Final  . RBC 11/22/2018 2.90* 3.87 - 5.11  MIL/uL Final  . Hemoglobin 11/22/2018 10.1* 12.0 - 15.0 g/dL Final  . HCT 11/22/2018 31.2* 36.0 - 46.0 % Final  . MCV 11/22/2018 107.6* 80.0 - 100.0 fL Final  . MCH 11/22/2018 34.8* 26.0 - 34.0 pg Final  . MCHC 11/22/2018 32.4  30.0 - 36.0 g/dL Final  . RDW 11/22/2018 14.0  11.5 - 15.5 % Final  . Platelets 11/22/2018 135* 150 - 400 K/uL Final  . nRBC 11/22/2018 0.0  0.0 - 0.2 % Final   Performed at [facility], [address], [place], [place] [address]  . Sodium 11/22/2018 135  135 - 145 mmol/L Final  . Potassium 11/22/2018 3.8  3.5 - 5.1 mmol/L Final  . Chloride 11/22/2018 104  98 - 111 mmol/L Final  . CO2 11/22/2018 28  22 - 32 mmol/L Final  . Glucose, Bld 11/22/2018 100* 70 - 99 mg/dL Final  . BUN 11/22/2018 8  6 - 20 mg/dL Final  . Creatinine, Ser 11/22/2018 0.69  0.44 - 1.00 mg/dL Final  . Calcium 11/22/2018 8.4* 8.9 - 10.3 mg/dL Final  . GFR calc non Af Amer 11/22/2018 >60  >60 mL/min Final  . GFR calc Af Amer 11/22/2018 >60  >60 mL/min Final  . Anion gap 11/22/2018 3* 5 - 15 Final   Performed at [facility], [address], [place], [place] [address]  . Sodium 11/23/2018 135  135 - 145 mmol/L Final  . Potassium 11/23/2018 4.0  3.5 - 5.1 mmol/L Final  . Chloride 11/23/2018 103  98 - 111 mmol/L Final  . CO2 11/23/2018 24  22 - 32 mmol/L Final  . Glucose, Bld 11/23/2018 99  70 - 99 mg/dL Final  . BUN 11/23/2018 12  6 - 20 mg/dL Final  . Creatinine, Ser 11/23/2018 0.67  0.44 -  1.00 mg/dL Final  . Calcium 11/23/2018 8.4* 8.9 - 10.3 mg/dL Final  . GFR calc non Af Amer 11/23/2018 >60  >60 mL/min Final  . GFR calc Af Amer 11/23/2018 >60  >60 mL/min Final  . Anion gap 11/23/2018 8  5 - 15 Final   Performed at [facility], [address], [place], [place] [address]  . WBC 11/23/2018 2.9* 4.0 - 10.5 K/uL Final  . RBC 11/23/2018 2.83* 3.87 - 5.11 MIL/uL Final  . Hemoglobin 11/23/2018 9.9* 12.0 - 15.0 g/dL Final  . HCT 11/23/2018 30.7* 36.0 - 46.0 %  Final  . MCV 11/23/2018 108.5* 80.0 - 100.0 fL Final  . MCH 11/23/2018 35.0* 26.0 - 34.0 pg Final  . MCHC 11/23/2018 32.2  30.0 - 36.0 g/dL Final  . RDW 11/23/2018 14.0  11.5 - 15.5 % Final  . Platelets 11/23/2018 131* 150 - 400 K/uL Final  . nRBC 11/23/2018 0.0  0.0 - 0.2 % Final   Performed at [facility], [address] [place], [place] [address]  . Magnesium 11/23/2018 1.5* 1.7 - 2.4 mg/dL Final   Performed at [facility], [address] [place], [place] [address]  . Osmolality,Stl 11/23/2018 FOS  mOsmol/kg Final   Comment: (NOTE) Fecal specimen too solid to determine osmolality. Specimen must be watery to assay in osmometer.      Rosana Fret notified 11/26/2018 Performed At: [facility] [place], [place] [id number] Rush Farmer MD QQ:[phone]   . WBC 11/24/2018 3.2* 4.0 - 10.5 K/uL Final  . RBC 11/24/2018 2.96* 3.87 - 5.11 MIL/uL Final  . Hemoglobin 11/24/2018 10.3* 12.0 - 15.0 g/dL Final  . HCT 11/24/2018 31.3* 36.0 - 46.0 % Final  . MCV 11/24/2018 105.7* 80.0 - 100.0 fL Final  . MCH 11/24/2018 34.8* 26.0 - 34.0 pg Final  . MCHC 11/24/2018 32.9  30.0 - 36.0 g/dL Final  . RDW 11/24/2018 13.7  11.5 - 15.5 % Final  . Platelets 11/24/2018 141* 150 - 400 K/uL Final  . nRBC 11/24/2018 0.0  0.0 - 0.2 % Final   Performed at [facility], [address] [place], [place] [address]  . WBC 11/25/2018 3.2* 4.0 - 10.5 K/uL Final  . RBC 11/25/2018 3.05* 3.87 - 5.11 MIL/uL Final  . Hemoglobin 11/25/2018 10.8* 12.0 - 15.0 g/dL Final  . HCT 11/25/2018 32.3* 36.0 - 46.0 % Final  . MCV 11/25/2018 105.9* 80.0 - 100.0 fL Final  . MCH 11/25/2018 35.4* 26.0 - 34.0 pg Final  . MCHC 11/25/2018 33.4  30.0 - 36.0 g/dL Final  . RDW 11/25/2018 13.7  11.5 - 15.5 % Final  . Platelets 11/25/2018 148* 150 - 400 K/uL Final  . nRBC 11/25/2018 0.0  0.0 - 0.2 % Final   Performed at [facility], [address] [place], [place] [address]   . Magnesium 11/25/2018 1.4* 1.7 - 2.4 mg/dL Final   Performed at [facility], [address] [place], [place] [address]  . WBC 11/26/2018 2.6* 4.0 - 10.5 K/uL Final  . RBC 11/26/2018 2.95* 3.87 - 5.11 MIL/uL Final  . Hemoglobin 11/26/2018 10.2* 12.0 - 15.0 g/dL Final  . HCT 11/26/2018 31.3* 36.0 - 46.0 % Final  . MCV 11/26/2018 106.1* 80.0 - 100.0 fL Final  . MCH 11/26/2018 34.6* 26.0 - 34.0 pg Final  . MCHC 11/26/2018 32.6  30.0 - 36.0 g/dL Final  . RDW 11/26/2018 13.7  11.5 - 15.5 % Final  .  Platelets 11/26/2018 147* 150 - 400 K/uL Final  . nRBC 11/26/2018 0.0  0.0 - 0.2 % Final   Performed at [facility], [address] [place], [place] [address]    Assessment:  MAGDELINE PRANGE is a 53 y.o. female with a history of stage IB1 cervical cancer s/p concurrent cisplatin and radiation (external beam and brachytherapy) from 11/2016 - 02/2017.    She was treated by Dr. Christene Slates at [facility] in [place], [place].  In 01/2017, she received cisplatin x 2 and carboplatin x 1 (01/29/2017) due to ARF and XRT. Radiation was followed by T & O on 02/01/2017 and T & N on 02/10/2017 and 02/20/2017.  Course was complicated by weight loss (80 pounds), nausea, vomiting, electrolyte wasting (potassium and magnesium), and multiple hospitalizations.  PET scan in 06/2017 revealed enlarging pulmonary nodules but no evidence of abdominal disease per patient report.  She is s/p right ileocolectomy on 08/31/2017 for a small bowel obstruction.  She presented with abdominal pain.  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 PDL-1 testing reveals a CPS score of 15.  Chest, abdomen, and pelvis CT on 04/15/2018 revealed innumerable (> 100) cavitary nodules scattered in the lungs, moderately enlarging compared to the 11/08/2017  PET-CT, suspicious for metastatic disease. One index node in the RLL measures 1.0 x 1.1 cm.  There were no new nodules.  There was an ill-defined wall thickening in the rectosigmoid with surrounding stranding along fascia planes, probably sequela from prior radiation therapy.  There was multilevel lumbar impingement due to spondylosis and degenerative disc disease.  There was heterogeneous enhancement in the uterus (some possibly from prior radiation therapy).  PET scan on 04/23/2018 revealed numerous scattered solid and cavitary nodules in the lungs stable increased in size compared to the prior PET-CT from 11/08/2017. Largest nodule was 1.1 cm in the LUL (SUV 1.9).  These demonstrated low-grade metabolic activity up to a maximum SUV of about 2.3, increased from 11/08/2017.   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  She is day 37 s/p cycle # 5 cycle of carboplatin and Taxol (06/04/2018 - 09/30/2018) with Margarette Canada support.    Chest CT on 09/30/2018 revealed interval decrease in size and number of metastatic pulmonary nodules.  There were no new/progressive findings in the chest or upper abdomen  Hypercoagulable work-up on 09/14/2017 revealed heterozygosity for Factor V Leiden (single [id number] mutation).  Testing was negative for prothrombin gene mutation, lupus anticoagulant panel, anticardiolipin antibodies, and beta-2 glycoprotein antibodies.  She was discharged on Xarelto.  She was admitted to [facility] from 12/03/2017 - 12/08/2017 with abdominal pain. She was not felt to have a uterine infarct.  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  The etiology was unclear.  She was admitted to [facility] from 10/08/2018 - 10/11/2018 for rectal bleeding. Hemoglobin dropped from 13 to 8.9 in 4 days. Platelets had decreased to 81,000; chronic anticoagulation (Xarelto) was held. She received 2 units of  PRBCs. Repeat stool studies were negative for diarrhea of infectious etiology. Colonoscopy on 10/10/2018 revealed 3 ulcers in the terminal ileum (no evidence of dysplasia or malignancy). Throughout the course of her admission, platelet count was monitored (dropped as low as 61,000).   She was admitted to [facility] from 10/17/2018 - 10/18/2018 at  [facility] abdominal pain, nausea and vomiting.  Abdomen and pelvis CT suggested a partial small bowel obstruction.  She was seen by general surgery and had no evidence of bowel obstruction clinically as she had a bowel movement was passing flatus and had a benign abdomen.  She is hepatitis C positive.   Hepatitis C genotype is 2a/2c.  She has B12 deficiency.  B12 was 165 on 11/27/2017.  She began B12 injections on 11/29/2017.  B12 injections occur monthly at home.  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  She has right knee pain and wishes to have surgery when feasible.  She was diagnosed with colitis.  Abdomen and pelvic CT on 05/23/2018 revealed colonic wall thickening, consistent with colitis. Considerations include inflammatory or infectious causes. Ischemic causes would be less likely. She is s/p RIGHT hemicolectomy. She had a patent ileocolic anastomosis.  She received a 10 day course of ciprofloxacin and Flagyl.  Abdomen and pelvic CT angiogram on 09/07/2018 revealed colitis, mainly affecting lower loops, question radiation colitis if there has been radiotherapy of the patient's cervical cancer.  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   She has  chronic diarrhea s/p right hemicolectomy.  She began sandostatin 20 mg IM q month on 10/22/2018.  She began Levbid on 10/23/2018.   EGDon10/15/2019revealed an erythematous mucosa in the cardia, bilious gastric fluid, and LA grade A reflux esophagitis.  She has adrenal insufficiency and is on hydrocortisone.  She underwent full dental extraction on 11/05/2018.  Symptomatically, she has ongoing diarrhea.   She has increased RLQ pain with cramping.  She has had significant hematochezia today.  She is weak and lightheaded.  Exam reveals tenderness in the RLQ without guarding or rebound tenderness.  Plan: 1. Labs today: CBC with diff, BMP, Mg 2. Metastatic cervical cancer Day 44 s/p last cycle of carboplatin + paclitaxel with Neulasta support. Concern again addressed regarding reinitiation of chemotherapy with ongoing GI issues. 3. Pancytopenia Hematocrit 36.0 and hemoglobin 11.8. Platelets 142,000.  WBC 2600 (ANC 1800). Rivaroxaban on hold since 11/17/2018.  Continue to hold Xarelto secondary to rectal bleeding. 4. Vaginal bleeding  No further bleeding.  Gyn-Onc noted vaginal atrophy with bleeding on contact on 10/23/2018. 5. Chronic diarrhea and rectal bleeding Last colonoscopy on 10/10/2018 revealed 3 ulcers noted at the terminal ileum.  Pathology was negative for high grade dysplasia or malignancy.  Concern continues for possible ulcerative colitis or Crohn's disease. Patient notes return of significant GI bleeding today. Patient has new RLQ pain. Contact GI (Dr Marius Ditch)- done. Check orthostatics.  IVF and recheck hematocrit/hemoglobin.  Suspect hemoglobin falsely elevated. Check stool for C diff. Patient has appointment with [facility] in 06/2019 (long wait time).  [facility] will be contacted. Discuss possible admission to [facility] secondary to bleeding. 6. Electrolyte derangements Weight loss of 7 pounds.  Ongoing diarrhea and fluid losses. Check orthostatics. IVF 1 liter NS + 3 gm magnesium today. BUN 14 and creatinine 0.88.  Potassium 3.8.  Magnesium  1.5. 7.  IVF followed by recheck of orthostatics. 8.  Recheck CBC after IVF. 9.  Discuss possible admission to [facility] secondary to rectal bleeding and increased RLQ pain.  Addendum:  CBC after 1 liter IVF reveals a hematocrit of 30.6, hemoglobin 10.0, platelets 121,000, WBC 3300.  INR is pending.   Lequita Asal, MD  12/04/2018, 3:45 AM

## 2018-12-05 ENCOUNTER — Encounter: Payer: Self-pay | Admitting: Urgent Care

## 2018-12-05 ENCOUNTER — Telehealth: Payer: Self-pay | Admitting: Gastroenterology

## 2018-12-05 ENCOUNTER — Encounter: Payer: Self-pay | Admitting: Gastroenterology

## 2018-12-05 NOTE — Telephone Encounter (Signed)
Patient called in pain with constipation. She has been taking Murelax but it isn't working. She normally has diarrhea. Please advise.

## 2018-12-06 ENCOUNTER — Encounter: Payer: Self-pay | Admitting: Hematology and Oncology

## 2018-12-06 NOTE — Telephone Encounter (Signed)
Spoke with pt she is no longer experiencing constipation, she is currently experiencing diarrhea again and is following up with physician from [facility] whom she was referred to. Pt wants to know if you can please inform Dr. Mike Gip that is ok for her to resume chemotherapy.

## 2018-12-09 ENCOUNTER — Encounter: Payer: Self-pay | Admitting: Urgent Care

## 2018-12-09 ENCOUNTER — Encounter: Payer: Self-pay | Admitting: Gastroenterology

## 2018-12-10 NOTE — Progress Notes (Signed)
[facility] day:  12/11/2018   Chief Complaint: Joanna Hall is a 53 y.o. female with stage IV cervical cancer who is seen for assessment following recent hospital discharge, GI consultation at [facility], and prior to cycle #6 carboplatin and Taxol.  HPI:  The patient was last seen in the medical oncology clinic on 11/19/2018.  At that time, she had ongoing diarrhea.  She had increased RLQ pain with cramping.  She had significant hematochezia.  She was weak and lightheaded.  Exam revealed tenderness in the RLQ without guarding or rebound tenderness.  She was admitted to the hospital.  She was admitted to [facility] from 11/19/2018 - 02/10/2020with a GI bleed.  Patient had serial hemoglobins which were stable.  Flexible sigmoidoscopy on 11/21/2018 revealed erythematous mucosa in the rectum treated with argon plasma coagulation. Video capsule studygot stuck in the right lower quadrant per KUB and CT scan with no obstruction.  Capsule was felt distal to the anastomosis.  She was seen by Dr Marius Ditch. She had ordered stool osmolality twice, however this was not processed due to reported solid stool sample by the lab.  She noted that she had takend Dulcolax at home.  She was advised her against using laxatives because she could be laxative dependent.  She was advised her to use MiraLAX or Colace only.  She recommended not restarting the rest of her outpatient antidiarrheal medications.  She had followed her for several month and did not have weight loss or protein-calorie malnutrition despite having chronic diarrhea which made it unlikely malabsorptive or inflammatory or secretory etiology.  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 She was to be referred for anorectal manometry as an outpatient.  Attempt was made to transfer the patient to [facility].  Dr. Margretta Sidle refused the transfer as patient does not meet inpatient  criteria.  She was tolerating a soft diet at discharge.  Xarelto was restarted.  She was seen by Dr. Efraim Kaufmann at [facility] on 12/03/2018.  Noted reviewed.  I spoke to him on 12/03/2018.  He was highly suspicious of small intestine bacterial overgrowth. She has a history of an ileocectomy which would increase her risk. He discussed formal testing for SIBO vs an empiric trial of rifaximin. Given the low risks of this medication, decision was made to pursue an empiric trial if approved by insurance. Should her symptoms persist despite therapy, additional testing was discussed. Given the severity of her diarrhea, he felt it would be ideal to see sustained improvement prior to resuming chemotherapy that could exacerbate her diarrhea.   During the interim, she has taken rifaximin x 1 week.  She does not notice a difference with her stools.  She is using Lomotil, Viberzi, and Levbid.  She denies diarrhea every day.  She states that yesterday was a good day.  She has had some diarrhea today.  She was constipated on 12/09/2018 and took Miralax.  She states that her energy level is good.  She is taking calcium and vitamin D.  She is taking 2 potassium pills.  She plan to get her dentures on 12/13/2018.   Past Medical History:  Diagnosis Date  . Abdominal pain 06/10/2018  . Abnormal cervical Papanicolaou smear 09/18/2017  . Anxiety   . Aortic atherosclerosis ([place])   . Arthritis    neck and knees  . 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  Blood transfusion without reported diagnosis   . Cervical cancer ([place]) 09/2016   mets lung  . Chronic anal fissure   . Chronic diarrhea   . Dyspnea   . Erosive gastropathy 09/18/2017  . Factor V Leiden mutation ([place])   . Fecal incontinence   . Genital warts   . GERD (gastroesophageal reflux disease)   . Heart murmur   . Hematochezia   . Hemorrhoids   . 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 Hepatitis, chronic ([place])  05/05/2017  . History of cancer chemotherapy    completed 06/2017  . History of Clostridium difficile infection    while undergoing chemo.  Negative test 10/2017  . Ileocolic anastomotic leak   . 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 Intestinal infection due to Clostridium difficile 09/18/2017  . Macrocytic anemia with vitamin B12 deficiency   . Multiple gastric ulcers   . Nausea vomiting and diarrhea   . Pancolitis ([place]) 07/27/2018  . Perianal condylomata   . Pneumonia    History of  . Pulmonary nodules   . Rectal bleeding   . Small bowel obstruction ([place]) 08/2017  . Stiff neck    limited right turn  . Vitamin D deficiency     Past Surgical History:  Procedure Laterality Date  . CHOLECYSTECTOMY    . COLON SURGERY  08/2017   resection  . COLONOSCOPY WITH PROPOFOL N/A 12/20/2017   Procedure: COLONOSCOPY WITH PROPOFOL;  Surgeon: [name], MD;  Location: [facility] ENDOSCOPY;  Service: Gastroenterology;  Laterality: N/A;  . COLONOSCOPY WITH PROPOFOL N/A 07/30/2018   Procedure: COLONOSCOPY WITH PROPOFOL;  Surgeon: [name], MD;  Location: [facility] ENDOSCOPY;  Service: Gastroenterology;  Laterality: N/A;  . COLONOSCOPY WITH PROPOFOL N/A 10/10/2018   Procedure: COLONOSCOPY WITH PROPOFOL;  Surgeon: Lucilla Lame, MD;  Location: [facility] ENDOSCOPY;  Service: Endoscopy;  Laterality: N/A;  . DIAGNOSTIC LAPAROSCOPY    . ESOPHAGOGASTRODUODENOSCOPY (EGD) WITH PROPOFOL N/A 12/20/2017   Procedure: ESOPHAGOGASTRODUODENOSCOPY (EGD) WITH PROPOFOL;  Surgeon: [name], MD;  Location: [place];  Service: Gastroenterology;  Laterality: N/A;  . ESOPHAGOGASTRODUODENOSCOPY (EGD) WITH PROPOFOL  07/30/2018   Procedure: ESOPHAGOGASTRODUODENOSCOPY (EGD) WITH PROPOFOL;  Surgeon: [name], MD;  Location: [facility] ENDOSCOPY;  Service: Gastroenterology;;  . Otho Darner SIGMOIDOSCOPY N/A 11/21/2018   Procedure: FLEXIBLE SIGMOIDOSCOPY;  Surgeon: [name], MD;   Location: [facility] ENDOSCOPY;  Service: Gastroenterology;  Laterality: N/A;  . LAPAROTOMY N/A 08/31/2017   Procedure: EXPLORATORY LAPAROTOMY for SBO, ileocolectomy, removal of piece of uterine wall;  Surgeon: [name], MD;  Location: [facility] ORS;  Service: General;  Laterality: N/A;  . LASER ABLATION CONDOLAMATA N/A 02/22/2018   Procedure: LASER ABLATION/REMOVAL OF ONGEXBMWUXL AROUND ANUS AND VAGINA;  Surgeon: Michael Boston, MD;  Location: [place];  Service: General;  Laterality: N/A;  . OOPHORECTOMY    . PORTA CATH INSERTION N/A 05/13/2018   Procedure: PORTA CATH INSERTION;  Surgeon: Katha Cabal, MD;  Location: [place] CV LAB;  Service: Cardiovascular;  Laterality: N/A;  . SMALL INTESTINE SURGERY    . TANDEM RING INSERTION     x3  . THORACOTOMY    . TOTAL KNEE ARTHROPLASTY Left 04/24/2018   Procedure: TOTAL KNEE ARTHROPLASTY;  Surgeon: Lovell Sheehan, MD;  Location: [facility] ORS;  Service: Orthopedics;  Laterality: Left;    Family History  Problem Relation Age of Onset  . Hypertension Father   .  Diabetes Father   . Alcohol abuse Daughter   . Hypertension Maternal Grandmother   . Diabetes Maternal Grandmother   . Diabetes Paternal Grandmother   . Hypertension Paternal Grandmother     Social History:  reports that she quit smoking about 12 years ago. She has never used smokeless tobacco. She reports previous alcohol use. She reports current drug use. Drug: Marijuana.  The patient's 77 year old daughter died of alcoholism.  The patient is originally from [place].  She moved from [place] to [place]. She is accompanied by her sister, Joanna Hall, today.   Allergies:  Allergies  Allergen Reactions  . Ketamine Anxiety and Other (See Comments)    Syncope episode/confusion     Current Medications: Current Outpatient Medications  Medication Sig Dispense Refill  . amitriptyline (ELAVIL) 75 MG tablet Take 1 tablet (75 mg total) by mouth at bedtime.  90 tablet 1  . budesonide (ENTOCORT EC) 3 MG 24 hr capsule Take 3 capsules (9 mg total) by mouth daily. 30 capsule 0  . Calcium Carb-Cholecalciferol (CALCIUM 500 +D) 500-400 MG-UNIT TABS Take 2 tablets by mouth daily.    . famotidine (PEPCID) 20 MG tablet Take 1 tablet (20 mg total) by mouth 2 (two) times daily as needed for heartburn or indigestion.    . hydrocortisone (CORTEF) 10 MG tablet Take 1 tablet (10 mg total) by mouth 2 (two) times daily. 180 tablet 0  . hyoscyamine (LEVBID) 0.375 MG 12 hr tablet Take 1 tablet (0.375 mg total) by mouth 2 (two) times daily. 60 tablet 0  . Multiple Vitamins-Minerals (MULTIVITAMIN WITH MINERALS) tablet Take 1 tablet by mouth daily. 30 tablet 0  . pantoprazole (PROTONIX) 40 MG tablet Take 1 tablet (40 mg total) by mouth daily. 90 tablet 0  . potassium chloride SA (K-DUR,KLOR-CON) 20 MEQ tablet Take 1 tablet (20 mEq total) by mouth daily. for 4 days. 30 tablet 0  . rivaroxaban (XARELTO) 20 MG TABS tablet Take 20 mg by mouth daily with supper.    Marland Kitchen VIBERZI 100 MG TABS TAKE 1 TABLET BY MOUTH TWICE DAILY WITH  A  MEAL 60 tablet 0  . diphenoxylate-atropine (LOMOTIL) 2.5-0.025 MG tablet Take 2 tablets by mouth 4 (four) times daily. (Patient not taking: Reported on 12/11/2018) 30 tablet 0  . feeding supplement, ENSURE ENLIVE, (ENSURE ENLIVE) LIQD Take 237 mLs by mouth 2 (two) times daily between meals. 237 mL 12  . hydrocortisone (ANUSOL-HC) 2.5 % rectal cream Place 1 application rectally 2 (two) times daily for 10 days. 28 g 0  . ondansetron (ZOFRAN) 4 MG tablet Take 1 tablet (4 mg total) by mouth every 6 (six) hours as needed for nausea. (Patient not taking: Reported on 12/11/2018) 20 tablet 0  . ondansetron (ZOFRAN) 8 MG tablet Take 1 tablet (8 mg total) by mouth every 8 (eight) hours as needed for nausea or vomiting. (Patient not taking: Reported on 11/28/2018) 30 tablet 1  . oxyCODONE (ROXICODONE) 5 MG immediate release tablet Take 2 tablets (10 mg total) by mouth  every 8 (eight) hours as needed. 30 tablet 0  . traMADol (ULTRAM) 50 MG tablet Take 1 tablet (50 mg total) by mouth every 12 (twelve) hours as needed for up to 5 days for severe pain. 10 tablet 0  . VENTOLIN HFA 108 (90 Base) MCG/ACT inhaler Inhale 1-2 puffs into the lungs every 4 (four) hours as needed for shortness of breath.   0   No current facility-administered medications for this  visit.    Facility-Administered Medications Ordered in Other Visits  Medication Dose Route Frequency Provider Last Rate Last Dose  . heparin lock flush 100 unit/mL  500 Units Intravenous Once ,  C, MD      . sodium chloride 0.9 % 1,000 mL with potassium chloride 20 mEq infusion   Intravenous Once Karen Kitchens, NP         Review of Systems  Constitutional: Negative for chills, diaphoresis, fever, malaise/fatigue and weight loss (up 2 pounds).       Energy level is good.  HENT: Negative.  Negative for congestion, ear discharge, ear pain, nosebleeds, sinus pain, sore throat and tinnitus.        Dentures planned for 12/13/2018.  Eyes: Negative.  Negative for blurred vision, double vision, photophobia and pain.  Respiratory: Positive for shortness of breath (exertional). Negative for cough, hemoptysis, sputum production and wheezing.   Cardiovascular: Negative.  Negative for chest pain, palpitations, orthopnea, leg swelling and PND.  Gastrointestinal: Positive for abdominal pain (chronic mild lower), constipation (uses Miralax prn) and diarrhea (intermittent). Negative for blood in stool (none), melena, nausea and vomiting.  Genitourinary: Negative.  Negative for dysuria, frequency, hematuria and urgency.       No vaginal bleeding.  Musculoskeletal: Negative.  Negative for back pain, falls, joint pain and neck pain.  Skin: Negative.  Negative for itching and rash.  Neurological: Negative for dizziness, tingling, tremors, sensory change, speech change, focal weakness, weakness and headaches.   Endo/Heme/Allergies: Negative.  Does not bruise/bleed easily.  Psychiatric/Behavioral: Negative.  Negative for depression and memory loss. The patient is not nervous/anxious and does not have insomnia.   All other systems reviewed and are negative.  Performance status (ECOG): 1-2  Vital Signs BP (!) 142/83 (BP Location: Left Arm, Patient Position: Sitting)   Pulse 85   Temp 97.9 F (36.6 C) (Tympanic)   Resp 18   Ht 5' 8"  (1.727 m)   Wt 173 lb 8 oz (78.7 kg)   SpO2 100%   BMI 26.38 kg/m   Physical Exam  Constitutional: She is oriented to person, place, and time and well-developed, well-nourished, and in no distress. Vital signs are normal.  Non-toxic appearance. No distress.  HENT:  Head: Normocephalic and atraumatic.  Mouth/Throat: Oropharynx is clear and moist and mucous membranes are normal. No oropharyngeal exudate.  Gray hair.  Well healed s/p full dental extraction.  Eyes: Pupils are equal, round, and reactive to light. Conjunctivae and EOM are normal. No scleral icterus.  Blue eyes.  Neck: Normal range of motion. Neck supple. No JVD present.  Cardiovascular: Normal rate, regular rhythm, normal heart sounds and intact distal pulses. Exam reveals no gallop and no friction rub.  No murmur heard. Pulmonary/Chest: Effort normal and breath sounds normal. No respiratory distress. She has no wheezes. She has no rales.  Abdominal: Soft. Bowel sounds are normal. She exhibits no distension and no mass. There is no hepatosplenomegaly. There is abdominal tenderness (minimal lower abdominal). There is no rebound and no guarding.  Musculoskeletal: Normal range of motion.        General: No tenderness or edema.  Lymphadenopathy:    She has no cervical adenopathy.    She has no axillary adenopathy.       Right: No supraclavicular adenopathy present.       Left: No supraclavicular adenopathy present.  Neurological: She is alert and oriented to person, place, and time. Gait normal.   Skin: Skin is warm and  dry. No rash noted. She is not diaphoretic. No erythema. No pallor.  Psychiatric: Mood, affect and judgment normal.  Nursing note reviewed.   Imaging studies: 06/2017:  PET scan revealed enlarging pulmonary nodules but no evidence of abdominal disease per patient report. 09/13/2017:  Abdomen and pelvic CT revealed a small infarct in the inferior pole of the LEFT kidney and RIGHT uterine infarct. 11/08/2017:  PET scan at the [place] [facility] revealed innumerable stable bilateral cavitary pulmonary nodules (up to 8 mm), some of which demonstrate low level metabolic activity.  There were post treatment changes in the pelvis without evidence of suspicious hypermetabolic activity. 12/03/2017:  Abdomen and pelvic CT revealed continued multiple pulmonary nodules in the lung bases concerning for metastatic disease.  There was mild wall thickening of rectosigmoid colon is noted.  There was a stable 1.8 x 1.6 cm mixed fat and soft tissue density noted in right lower quadrant which may represent fat necrosis.  There was a larger low density is noted involving uterine fundus.  12/03/2017:  Pelvic MRI was unremarkable.   04/15/2018:  Chest, abdomen, and pelvis CT revealed innumerable (> 100) cavitary nodules scattered in the lungs, moderately enlarging compared to the 11/08/2017 PET-CT, suspicious for metastatic disease.  One index node in the RLL measures 1.0 x 1.1 cm (previously 0.6 x 0.6 cm).  There were no new nodules.  There was an ill-defined wall thickening in the rectosigmoid with surrounding stranding along fascia planes, probably sequela from prior radiation therapy.  There was multilevel lumbar impingement due to spondylosis and degenerative disc disease.  There was heterogeneous enhancement in the uterus (some possibly from prior radiation therapy). 04/23/2018:  PET scan revealed numerous scattered solid and cavitary nodules in the lungs stable increased in  size compared to the prior PET-CT from 11/08/2017. Largest nodule was 1.1 cm in the LUL (SUV 1.9).  These demonstrated low-grade metabolic activity up to a maximum SUV of about 2.3, increased from 11/08/2017.  05/23/2018:  Abdomen and pelvic CT revealed colonic wall thickening, consistent with colitis. Considerations include inflammatory or infectious causes. Ischemic causes would be less likely. She is s/p RIGHT hemicolectomy. She had a patent ileocolic anastomosis.  She received a 10 day course of ciprofloxacin and Flagyl.   09/07/2018:  Abdomen and pelvic CT angiogram revealed colitis, mainly affecting lower loops, question radiation colitis if there has been radiotherapy of the patient's cervical cancer.  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  09/30/2018:  Chest CT revealed interval decrease in size and number of metastatic pulmonary nodules.  There were no new/progressive findings in the chest or upper abdomen   Infusion on 12/11/2018  Component Date Value Ref Range Status  . Magnesium 12/11/2018 1.5* 1.7 - 2.4 mg/dL Final   Performed at [facility], [address] [place], [place] [address]  . WBC 12/11/2018 3.8* 4.0 - 10.5 K/uL Final  . RBC 12/11/2018 3.42* 3.87 - 5.11 MIL/uL Final  . Hemoglobin 12/11/2018 12.2  12.0 - 15.0 g/dL Final  . HCT 12/11/2018 35.9* 36.0 - 46.0 % Final  . MCV 12/11/2018 105.0* 80.0 - 100.0 fL Final  . MCH 12/11/2018 35.7* 26.0 - 34.0 pg Final  . MCHC 12/11/2018 34.0  30.0 - 36.0 g/dL Final  . RDW 12/11/2018 13.1  11.5 - 15.5 % Final  . Platelets 12/11/2018 174  150 - 400 K/uL Final  . nRBC 12/11/2018 0.0  0.0 - 0.2 % Final  . Neutrophils Relative % 12/11/2018 70  %  Final  . Neutro Abs 12/11/2018 2.6  1.7 - 7.7 K/uL Final  . Lymphocytes Relative 12/11/2018 23  % Final  . Lymphs Abs 12/11/2018 0.9  0.7 - 4.0 K/uL Final  . Monocytes Relative 12/11/2018 5  % Final  . Monocytes Absolute 12/11/2018 0.2  0.1 - 1.0 K/uL Final  . Eosinophils Relative  12/11/2018 1  % Final  . Eosinophils Absolute 12/11/2018 0.1  0.0 - 0.5 K/uL Final  . Basophils Relative 12/11/2018 1  % Final  . Basophils Absolute 12/11/2018 0.0  0.0 - 0.1 K/uL Final  . Immature Granulocytes 12/11/2018 0  % Final  . Abs Immature Granulocytes 12/11/2018 0.01  0.00 - 0.07 K/uL Final   Performed at [facility], [address], [place], [place] [address]  . Sodium 12/11/2018 137  135 - 145 mmol/L Final  . Potassium 12/11/2018 4.1  3.5 - 5.1 mmol/L Final  . Chloride 12/11/2018 102  98 - 111 mmol/L Final  . CO2 12/11/2018 26  22 - 32 mmol/L Final  . Glucose, Bld 12/11/2018 103* 70 - 99 mg/dL Final  . BUN 12/11/2018 12  6 - 20 mg/dL Final  . Creatinine, Ser 12/11/2018 0.75  0.44 - 1.00 mg/dL Final  . Calcium 12/11/2018 9.3  8.9 - 10.3 mg/dL Final  . Total Protein 12/11/2018 8.5* 6.5 - 8.1 g/dL Final  . Albumin 12/11/2018 4.0  3.5 - 5.0 g/dL Final  . AST 12/11/2018 33  15 - 41 U/L Final  . ALT 12/11/2018 49* 0 - 44 U/L Final  . Alkaline Phosphatase 12/11/2018 166* 38 - 126 U/L Final  . Total Bilirubin 12/11/2018 0.4  0.3 - 1.2 mg/dL Final  . GFR calc non Af Amer 12/11/2018 >60  >60 mL/min Final  . GFR calc Af Amer 12/11/2018 >60  >60 mL/min Final  . Anion gap 12/11/2018 9  5 - 15 Final   Performed at [facility], [address] [place], [place] [address]    Assessment:  DEMARIE UHLIG is a 53 y.o. female with a history of stage IB1 cervical cancer s/p concurrent cisplatin and radiation (external beam and brachytherapy) from 11/2016 - 02/2017.    She was treated by Dr. Christene Slates at [facility] in [place], [place].  In 01/2017, she received cisplatin x 2 and carboplatin x 1 (01/29/2017) due to ARF and XRT. Radiation was followed by T & O on 02/01/2017 and T & N on 02/10/2017 and 02/20/2017.  Course was complicated by weight loss (80 pounds), nausea, vomiting, electrolyte wasting (potassium and magnesium), and  multiple hospitalizations.  PET scan in 06/2017 revealed enlarging pulmonary nodules but no evidence of abdominal disease per patient report.  She is s/p right ileocolectomy on 08/31/2017 for a small bowel obstruction.  She presented with abdominal pain.  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 PDL-1 testing reveals a CPS score of 15.  Chest, abdomen, and pelvis CT on 04/15/2018 revealed innumerable (> 100) cavitary nodules scattered in the lungs, moderately enlarging compared to the 11/08/2017 PET-CT, suspicious for metastatic disease. One index node in the RLL measures 1.0 x 1.1 cm.  There were no new nodules.  There was an ill-defined wall thickening in the rectosigmoid with surrounding stranding along fascia planes, probably sequela from prior radiation therapy.  There was multilevel lumbar impingement due  to spondylosis and degenerative disc disease.  There was heterogeneous enhancement in the uterus (some possibly from prior radiation therapy).  PET scan on 04/23/2018 revealed numerous scattered solid and cavitary nodules in the lungs stable increased in size compared to the prior PET-CT from 11/08/2017. Largest nodule was 1.1 cm in the LUL (SUV 1.9).  These demonstrated low-grade metabolic activity up to a maximum SUV of about 2.3, increased from 11/08/2017.   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  She is s/p cycle # 5 cycle of carboplatin and Taxol (06/04/2018 - 09/30/2018) with Margarette Canada support.    Chest CT on 09/30/2018 revealed interval decrease in size and number of metastatic pulmonary nodules.  There were no new/progressive findings in the chest or upper abdomen  Hypercoagulable work-up on 09/14/2017 revealed heterozygosity for Factor V  Leiden (single [id number] mutation).  Testing was negative for prothrombin gene mutation, lupus anticoagulant panel, anticardiolipin antibodies, and beta-2 glycoprotein antibodies.  She was discharged on Xarelto.  She was admitted to [facility] from 12/03/2017 - 12/08/2017 with abdominal pain. She was not felt to have a uterine infarct.  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  The etiology was unclear.  She was admitted to [facility] from 10/08/2018 - 10/11/2018 for rectal bleeding. Hemoglobin dropped from 13 to 8.9 in 4 days. Platelets had decreased to 81,000; chronic anticoagulation (Xarelto) was held. She received 2 units of PRBCs. Repeat stool studies were negative for diarrhea of infectious etiology. Colonoscopy on 10/10/2018 revealed 3 ulcers in the terminal ileum (no evidence of dysplasia or malignancy). Throughout the course of her admission, platelet count was monitored (dropped as low as 61,000).   She was admitted to [facility] from 10/17/2018 - 10/18/2018 at [facility] abdominal pain, nausea and vomiting.  Abdomen and pelvis CT suggested a partial small bowel obstruction.  She was seen by general surgery and had no evidence of bowel obstruction clinically as she had a bowel movement was passing flatus and had a benign abdomen.  She is hepatitis C positive.   Hepatitis C genotype is 2a/2c.  She has B12 deficiency.  B12 was 165 on 11/27/2017.  She began B12 injections on 11/29/2017.  B12 injections occur monthly at home.  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  She has right knee pain and wishes to have surgery when feasible.  She was diagnosed with colitis.  Abdomen and pelvic CT on 05/23/2018 revealed colonic wall thickening, consistent with colitis. Considerations include inflammatory or infectious causes. Ischemic causes would be less likely. She is s/p RIGHT hemicolectomy. She had a patent ileocolic anastomosis.  She received a 10 day course of ciprofloxacin and Flagyl.  Abdomen and  pelvic CT angiogram on 09/07/2018 revealed colitis, mainly affecting lower loops, question radiation colitis if there has been radiotherapy of the patient's cervical cancer.  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   She has  chronic diarrhea s/p right hemicolectomy.  She began sandostatin 20 mg IM q month on 10/22/2018.  She began Levbid on 10/23/2018.   EGDon10/15/2019revealed an erythematous mucosa in the cardia, bilious gastric fluid, and LA grade A reflux esophagitis.  She has adrenal insufficiency and is on hydrocortisone.  She underwent full dental extraction on 11/05/2018.  Symptomatically, she notes intermittent diarrhea and constipation.  Exam reveals minimal lower abdominal discomfort.  Plan: 1. Labs today: CBC with diff, CMP, Mg. 2. Metastatic cervical cancer Discuss plan to reinitiate chemotherapy. Discuss 2-3 cycle of  chemotherapy prior to reimaging. Patient consented to reinitiation of chemotherapy. Discuss prior issues with chemotherapy and IVF support. Cycle #6 carboplatin and Taxol. RTC tomorrow for Neulasta. 3. Pancytopenia Hematocrit 35.9 and hemoglobin 12.0. Platelets 174,000.  WBC 3800 ([place] 2600). Patient requires Neulsta support with chemotherapy. 4. Vaginal bleeding  No further bleeding.  Gyn-Onc noted vaginal atrophy with bleeding on contact on 10/23/2018.  Continue Xarelto.    May require holding anticoagulation if bleeding or thrombocytopenia. 5. Chronic diarrhea  Patient s/p week #1 rifaximin.  Patient on Lomotil, Viberzi, and Levbid.  Unclear role of Miralax and other laxatives/stool softeners.  Patient follows up with Dr. Marius Ditch and Dr Merleen Milliner. 6. Electrolyte derangements Potassium 4.1.  Magnesium  1.5.  Magnesium 2 gm IV today. 7.   RTC in 1 week for NP assessment, labs (BMP, Mg), and +/- IVF, +/- Mg.   Lequita Asal, MD  12/11/2018, 8:47 PM

## 2018-12-11 ENCOUNTER — Ambulatory Visit: Payer: Medicaid Other

## 2018-12-11 ENCOUNTER — Encounter: Payer: Self-pay | Admitting: Hematology and Oncology

## 2018-12-11 ENCOUNTER — Inpatient Hospital Stay: Payer: Medicaid Other

## 2018-12-11 ENCOUNTER — Inpatient Hospital Stay (HOSPITAL_BASED_OUTPATIENT_CLINIC_OR_DEPARTMENT_OTHER): Payer: Medicaid Other | Admitting: Hematology and Oncology

## 2018-12-11 ENCOUNTER — Inpatient Hospital Stay: Payer: Medicaid Other | Admitting: Family Medicine

## 2018-12-11 VITALS — BP 142/83 | HR 85 | Temp 97.9°F | Resp 18 | Ht 68.0 in | Wt 173.5 lb

## 2018-12-11 DIAGNOSIS — C539 Malignant neoplasm of cervix uteri, unspecified: Secondary | ICD-10-CM | POA: Diagnosis present

## 2018-12-11 DIAGNOSIS — C7800 Secondary malignant neoplasm of unspecified lung: Secondary | ICD-10-CM

## 2018-12-11 DIAGNOSIS — Z9221 Personal history of antineoplastic chemotherapy: Secondary | ICD-10-CM

## 2018-12-11 DIAGNOSIS — Z923 Personal history of irradiation: Secondary | ICD-10-CM

## 2018-12-11 DIAGNOSIS — C538 Malignant neoplasm of overlapping sites of cervix uteri: Secondary | ICD-10-CM

## 2018-12-11 DIAGNOSIS — N939 Abnormal uterine and vaginal bleeding, unspecified: Secondary | ICD-10-CM | POA: Insufficient documentation

## 2018-12-11 DIAGNOSIS — Z7901 Long term (current) use of anticoagulants: Secondary | ICD-10-CM

## 2018-12-11 DIAGNOSIS — D61818 Other pancytopenia: Secondary | ICD-10-CM | POA: Diagnosis not present

## 2018-12-11 DIAGNOSIS — K529 Noninfective gastroenteritis and colitis, unspecified: Secondary | ICD-10-CM

## 2018-12-11 DIAGNOSIS — Z90722 Acquired absence of ovaries, bilateral: Secondary | ICD-10-CM | POA: Diagnosis not present

## 2018-12-11 DIAGNOSIS — A630 Anogenital (venereal) warts: Secondary | ICD-10-CM | POA: Diagnosis not present

## 2018-12-11 DIAGNOSIS — Z79899 Other long term (current) drug therapy: Secondary | ICD-10-CM | POA: Diagnosis not present

## 2018-12-11 DIAGNOSIS — Z87891 Personal history of nicotine dependence: Secondary | ICD-10-CM | POA: Diagnosis not present

## 2018-12-11 DIAGNOSIS — Z5111 Encounter for antineoplastic chemotherapy: Secondary | ICD-10-CM

## 2018-12-11 DIAGNOSIS — D6851 Activated protein C resistance: Secondary | ICD-10-CM

## 2018-12-11 DIAGNOSIS — R300 Dysuria: Secondary | ICD-10-CM | POA: Insufficient documentation

## 2018-12-11 DIAGNOSIS — B182 Chronic viral hepatitis C: Secondary | ICD-10-CM | POA: Insufficient documentation

## 2018-12-11 DIAGNOSIS — D649 Anemia, unspecified: Secondary | ICD-10-CM

## 2018-12-11 LAB — COMPREHENSIVE METABOLIC PANEL
ALT: 49 U/L — ABNORMAL HIGH (ref 0–44)
AST: 33 U/L (ref 15–41)
Albumin: 4 g/dL (ref 3.5–5.0)
Alkaline Phosphatase: 166 U/L — ABNORMAL HIGH (ref 38–126)
Anion gap: 9 (ref 5–15)
BUN: 12 mg/dL (ref 6–20)
CO2: 26 mmol/L (ref 22–32)
Calcium: 9.3 mg/dL (ref 8.9–10.3)
Chloride: 102 mmol/L (ref 98–111)
Creatinine, Ser: 0.75 mg/dL (ref 0.44–1.00)
GFR calc Af Amer: >60 mL/min (ref >60)
GFR calc non Af Amer: >60 mL/min (ref >60)
Glucose, Bld: 103 mg/dL — ABNORMAL HIGH (ref 70–99)
Potassium: 4.1 mmol/L (ref 3.5–5.1)
Sodium: 137 mmol/L (ref 135–145)
Total Bilirubin: 0.4 mg/dL (ref 0.3–1.2)
Total Protein: 8.5 g/dL — ABNORMAL HIGH (ref 6.5–8.1)

## 2018-12-11 LAB — CBC WITH DIFFERENTIAL/PLATELET
Abs Immature Granulocytes: 0.01 10*3/uL (ref 0.00–0.07)
Basophils Absolute: 0 10*3/uL (ref 0.0–0.1)
Basophils Relative: 1 %
Eosinophils Absolute: 0.1 10*3/uL (ref 0.0–0.5)
Eosinophils Relative: 1 %
HCT: 35.9 % — ABNORMAL LOW (ref 36.0–46.0)
Hemoglobin: 12.2 g/dL (ref 12.0–15.0)
Immature Granulocytes: 0 %
Lymphocytes Relative: 23 %
Lymphs Abs: 0.9 10*3/uL (ref 0.7–4.0)
MCH: 35.7 pg — ABNORMAL HIGH (ref 26.0–34.0)
MCHC: 34 g/dL (ref 30.0–36.0)
MCV: 105 fL — ABNORMAL HIGH (ref 80.0–100.0)
Monocytes Absolute: 0.2 10*3/uL (ref 0.1–1.0)
Monocytes Relative: 5 %
Neutro Abs: 2.6 10*3/uL (ref 1.7–7.7)
Neutrophils Relative %: 70 %
Platelets: 174 10*3/uL (ref 150–400)
RBC: 3.42 MIL/uL — ABNORMAL LOW (ref 3.87–5.11)
RDW: 13.1 % (ref 11.5–15.5)
WBC: 3.8 10*3/uL — ABNORMAL LOW (ref 4.0–10.5)
nRBC: 0 % (ref 0.0–0.2)

## 2018-12-11 LAB — MAGNESIUM: Magnesium: 1.5 mg/dL — ABNORMAL LOW (ref 1.7–2.4)

## 2018-12-11 MED ORDER — DIPHENHYDRAMINE HCL 50 MG/ML IJ SOLN
50.0000 mg | Freq: Once | INTRAMUSCULAR | Status: AC
  Administered 2018-12-11: 50 mg via INTRAVENOUS
  Filled 2018-12-11: qty 1

## 2018-12-11 MED ORDER — MAGNESIUM SULFATE 2 GM/50ML IV SOLN
INTRAVENOUS | Status: AC
  Filled 2018-12-11: qty 50

## 2018-12-11 MED ORDER — FAMOTIDINE IN NACL 20-0.9 MG/50ML-% IV SOLN
20.0000 mg | Freq: Once | INTRAVENOUS | Status: AC
  Administered 2018-12-11: 20 mg via INTRAVENOUS
  Filled 2018-12-11: qty 50

## 2018-12-11 MED ORDER — SODIUM CHLORIDE 0.9 % IV SOLN
155.0000 mg/m2 | Freq: Once | INTRAVENOUS | Status: AC
  Administered 2018-12-11: 294 mg via INTRAVENOUS
  Filled 2018-12-11: qty 49

## 2018-12-11 MED ORDER — SODIUM CHLORIDE 0.9 % IV SOLN
Freq: Once | INTRAVENOUS | Status: AC
  Administered 2018-12-11: 10:00:00 via INTRAVENOUS
  Filled 2018-12-11: qty 250

## 2018-12-11 MED ORDER — HEPARIN SOD (PORK) LOCK FLUSH 100 UNIT/ML IV SOLN
500.0000 [IU] | Freq: Once | INTRAVENOUS | Status: AC
  Administered 2018-12-11: 500 [IU] via INTRAVENOUS
  Filled 2018-12-11: qty 5

## 2018-12-11 MED ORDER — MAGNESIUM SULFATE 2 GM/50ML IV SOLN
2.0000 g | Freq: Once | INTRAVENOUS | Status: AC
  Administered 2018-12-11: 2 g via INTRAVENOUS

## 2018-12-11 MED ORDER — SODIUM CHLORIDE 0.9 % IV SOLN
500.0000 mg | Freq: Once | INTRAVENOUS | Status: AC
  Administered 2018-12-11: 500 mg via INTRAVENOUS
  Filled 2018-12-11: qty 50

## 2018-12-11 MED ORDER — SODIUM CHLORIDE 0.9% FLUSH
10.0000 mL | INTRAVENOUS | Status: DC | PRN
  Administered 2018-12-11: 10 mL via INTRAVENOUS
  Filled 2018-12-11: qty 10

## 2018-12-11 MED ORDER — SODIUM CHLORIDE 0.9 % IV SOLN
20.0000 mg | Freq: Once | INTRAVENOUS | Status: AC
  Administered 2018-12-11: 20 mg via INTRAVENOUS
  Filled 2018-12-11: qty 2

## 2018-12-11 MED ORDER — PALONOSETRON HCL INJECTION 0.25 MG/5ML
0.2500 mg | Freq: Once | INTRAVENOUS | Status: AC
  Administered 2018-12-11: 0.25 mg via INTRAVENOUS
  Filled 2018-12-11: qty 5

## 2018-12-11 NOTE — Progress Notes (Signed)
No new changes noted today 

## 2018-12-11 NOTE — Patient Instructions (Signed)
Carboplatin injection What is this medicine? CARBOPLATIN (KAR boe pla tin) is a chemotherapy drug. It targets fast dividing cells, like cancer cells, and causes these cells to die. This medicine is used to treat ovarian cancer and many other cancers. This medicine may be used for other purposes; ask your health care provider or pharmacist if you have questions. COMMON BRAND NAME(S): Paraplatin What should I tell my health care provider before I take this medicine? They need to know if you have any of these conditions: -blood disorders -hearing problems -kidney disease -recent or ongoing radiation therapy -an unusual or allergic reaction to carboplatin, cisplatin, other chemotherapy, other medicines, foods, dyes, or preservatives -pregnant or trying to get pregnant -breast-feeding How should I use this medicine? This drug is usually given as an infusion into a vein. It is administered in a hospital or clinic by a specially trained health care professional. Talk to your pediatrician regarding the use of this medicine in children. Special care may be needed. Overdosage: If you think you have taken too much of this medicine contact a poison control center or emergency room at once. NOTE: This medicine is only for you. Do not share this medicine with others. What if I miss a dose? It is important not to miss a dose. Call your doctor or health care professional if you are unable to keep an appointment. What may interact with this medicine? -medicines for seizures -medicines to increase blood counts like filgrastim, pegfilgrastim, sargramostim -some antibiotics like amikacin, gentamicin, neomycin, streptomycin, tobramycin -vaccines Talk to your doctor or health care professional before taking any of these medicines: -acetaminophen -aspirin -ibuprofen -ketoprofen -naproxen This list may not describe all possible interactions. Give your health care provider a list of all the medicines, herbs,  non-prescription drugs, or dietary supplements you use. Also tell them if you smoke, drink alcohol, or use illegal drugs. Some items may interact with your medicine. What should I watch for while using this medicine? Your condition will be monitored carefully while you are receiving this medicine. You will need important blood work done while you are taking this medicine. This drug may make you feel generally unwell. This is not uncommon, as chemotherapy can affect healthy cells as well as cancer cells. Report any side effects. Continue your course of treatment even though you feel ill unless your doctor tells you to stop. In some cases, you may be given additional medicines to help with side effects. Follow all directions for their use. Call your doctor or health care professional for advice if you get a fever, chills or sore throat, or other symptoms of a cold or flu. Do not treat yourself. This drug decreases your body's ability to fight infections. Try to avoid being around people who are sick. This medicine may increase your risk to bruise or bleed. Call your doctor or health care professional if you notice any unusual bleeding. Be careful brushing and flossing your teeth or using a toothpick because you may get an infection or bleed more easily. If you have any dental work done, tell your dentist you are receiving this medicine. Avoid taking products that contain aspirin, acetaminophen, ibuprofen, naproxen, or ketoprofen unless instructed by your doctor. These medicines may hide a fever. Do not become pregnant while taking this medicine. Women should inform their doctor if they wish to become pregnant or think they might be pregnant. There is a potential for serious side effects to an unborn child. Talk to your health care professional or  pharmacist for more information. Do not breast-feed an infant while taking this medicine. What side effects may I notice from receiving this medicine? Side effects  that you should report to your doctor or health care professional as soon as possible: -allergic reactions like skin rash, itching or hives, swelling of the face, lips, or tongue -signs of infection - fever or chills, cough, sore throat, pain or difficulty passing urine -signs of decreased platelets or bleeding - bruising, pinpoint red spots on the skin, black, tarry stools, nosebleeds -signs of decreased red blood cells - unusually weak or tired, fainting spells, lightheadedness -breathing problems -changes in hearing -changes in vision -chest pain -high blood pressure -low blood counts - This drug may decrease the number of white blood cells, red blood cells and platelets. You may be at increased risk for infections and bleeding. -nausea and vomiting -pain, swelling, redness or irritation at the injection site -pain, tingling, numbness in the hands or feet -problems with balance, talking, walking -trouble passing urine or change in the amount of urine Side effects that usually do not require medical attention (report to your doctor or health care professional if they continue or are bothersome): -hair loss -loss of appetite -metallic taste in the mouth or changes in taste This list may not describe all possible side effects. Call your doctor for medical advice about side effects. You may report side effects to FDA at 1-800-FDA-1088. Where should I keep my medicine? This drug is given in a hospital or clinic and will not be stored at home. NOTE: This sheet is a summary. It may not cover all possible information. If you have questions about this medicine, talk to your doctor, pharmacist, or health care provider.  2019 Elsevier/Gold Standard (2008-01-07 14:38:05) Paclitaxel injection What is this medicine? PACLITAXEL (PAK li TAX el) is a chemotherapy drug. It targets fast dividing cells, like cancer cells, and causes these cells to die. This medicine is used to treat ovarian cancer, breast  cancer, lung cancer, Kaposi's sarcoma, and other cancers. This medicine may be used for other purposes; ask your health care provider or pharmacist if you have questions. COMMON BRAND NAME(S): Onxol, Taxol What should I tell my health care provider before I take this medicine? They need to know if you have any of these conditions: -history of irregular heartbeat -liver disease -low blood counts, like low white cell, platelet, or red cell counts -lung or breathing disease, like asthma -tingling of the fingers or toes, or other nerve disorder -an unusual or allergic reaction to paclitaxel, alcohol, polyoxyethylated castor oil, other chemotherapy, other medicines, foods, dyes, or preservatives -pregnant or trying to get pregnant -breast-feeding How should I use this medicine? This drug is given as an infusion into a vein. It is administered in a hospital or clinic by a specially trained health care professional. Talk to your pediatrician regarding the use of this medicine in children. Special care may be needed. Overdosage: If you think you have taken too much of this medicine contact a poison control center or emergency room at once. NOTE: This medicine is only for you. Do not share this medicine with others. What if I miss a dose? It is important not to miss your dose. Call your doctor or health care professional if you are unable to keep an appointment. What may interact with this medicine? Do not take this medicine with any of the following medications: -disulfiram -metronidazole This medicine may also interact with the following medications: -antiviral medicines for  hepatitis, HIV or AIDS -certain antibiotics like erythromycin and clarithromycin -certain medicines for fungal infections like ketoconazole and itraconazole -certain medicines for seizures like carbamazepine, phenobarbital, phenytoin -gemfibrozil -nefazodone -rifampin -St. John's wort This list may not describe all  possible interactions. Give your health care provider a list of all the medicines, herbs, non-prescription drugs, or dietary supplements you use. Also tell them if you smoke, drink alcohol, or use illegal drugs. Some items may interact with your medicine. What should I watch for while using this medicine? Your condition will be monitored carefully while you are receiving this medicine. You will need important blood work done while you are taking this medicine. This medicine can cause serious allergic reactions. To reduce your risk you will need to take other medicine(s) before treatment with this medicine. If you experience allergic reactions like skin rash, itching or hives, swelling of the face, lips, or tongue, tell your doctor or health care professional right away. In some cases, you may be given additional medicines to help with side effects. Follow all directions for their use. This drug may make you feel generally unwell. This is not uncommon, as chemotherapy can affect healthy cells as well as cancer cells. Report any side effects. Continue your course of treatment even though you feel ill unless your doctor tells you to stop. Call your doctor or health care professional for advice if you get a fever, chills or sore throat, or other symptoms of a cold or flu. Do not treat yourself. This drug decreases your body's ability to fight infections. Try to avoid being around people who are sick. This medicine may increase your risk to bruise or bleed. Call your doctor or health care professional if you notice any unusual bleeding. Be careful brushing and flossing your teeth or using a toothpick because you may get an infection or bleed more easily. If you have any dental work done, tell your dentist you are receiving this medicine. Avoid taking products that contain aspirin, acetaminophen, ibuprofen, naproxen, or ketoprofen unless instructed by your doctor. These medicines may hide a fever. Do not become  pregnant while taking this medicine. Women should inform their doctor if they wish to become pregnant or think they might be pregnant. There is a potential for serious side effects to an unborn child. Talk to your health care professional or pharmacist for more information. Do not breast-feed an infant while taking this medicine. Men are advised not to father a child while receiving this medicine. This product may contain alcohol. Ask your pharmacist or healthcare provider if this medicine contains alcohol. Be sure to tell all healthcare providers you are taking this medicine. Certain medicines, like metronidazole and disulfiram, can cause an unpleasant reaction when taken with alcohol. The reaction includes flushing, headache, nausea, vomiting, sweating, and increased thirst. The reaction can last from 30 minutes to several hours. What side effects may I notice from receiving this medicine? Side effects that you should report to your doctor or health care professional as soon as possible: -allergic reactions like skin rash, itching or hives, swelling of the face, lips, or tongue -breathing problems -changes in vision -fast, irregular heartbeat -high or low blood pressure -mouth sores -pain, tingling, numbness in the hands or feet -signs of decreased platelets or bleeding - bruising, pinpoint red spots on the skin, black, tarry stools, blood in the urine -signs of decreased red blood cells - unusually weak or tired, feeling faint or lightheaded, falls -signs of infection - fever or chills,  cough, sore throat, pain or difficulty passing urine -signs and symptoms of liver injury like dark yellow or brown urine; general ill feeling or flu-like symptoms; light-colored stools; loss of appetite; nausea; right upper belly pain; unusually weak or tired; yellowing of the eyes or skin -swelling of the ankles, feet, hands -unusually slow heartbeat Side effects that usually do not require medical attention  (report to your doctor or health care professional if they continue or are bothersome): -diarrhea -hair loss -loss of appetite -muscle or joint pain -nausea, vomiting -pain, redness, or irritation at site where injected -tiredness This list may not describe all possible side effects. Call your doctor for medical advice about side effects. You may report side effects to FDA at 1-800-FDA-1088. Where should I keep my medicine? This drug is given in a hospital or clinic and will not be stored at home. NOTE: This sheet is a summary. It may not cover all possible information. If you have questions about this medicine, talk to your doctor, pharmacist, or health care provider.  2019 Elsevier/Gold Standard (2017-06-05 13:14:55)

## 2018-12-12 ENCOUNTER — Encounter: Payer: Self-pay | Admitting: Gastroenterology

## 2018-12-12 ENCOUNTER — Inpatient Hospital Stay: Payer: Medicaid Other

## 2018-12-12 ENCOUNTER — Encounter: Payer: Self-pay | Admitting: Urgent Care

## 2018-12-12 VITALS — BP 124/84 | HR 94 | Temp 96.0°F | Resp 18

## 2018-12-12 DIAGNOSIS — K6289 Other specified diseases of anus and rectum: Secondary | ICD-10-CM

## 2018-12-12 DIAGNOSIS — C539 Malignant neoplasm of cervix uteri, unspecified: Secondary | ICD-10-CM | POA: Diagnosis not present

## 2018-12-12 DIAGNOSIS — C538 Malignant neoplasm of overlapping sites of cervix uteri: Secondary | ICD-10-CM

## 2018-12-12 MED ORDER — PEGFILGRASTIM INJECTION 6 MG/0.6ML ~~LOC~~
6.0000 mg | PREFILLED_SYRINGE | Freq: Once | SUBCUTANEOUS | Status: AC
  Administered 2018-12-12: 6 mg via SUBCUTANEOUS

## 2018-12-12 NOTE — Progress Notes (Signed)
  [facility] [address] [place], [place] [address] Phone: [phone] Fax: [phone]   Date: 12/12/18  Name: Joanna Hall DOB: [birth year] MRN: [id number]  Re:  Pain medication requests   Patient has made requests for pain medication from this practice, as well as [facility].   Patient contacted the office on 12/04/2018 with specific request for Roxicodone 10 mg tabs. Of note, this practice has only given patient Roxicodone 5 mg tablets in the past, however medication dose was increased during recent admission by hospitalist Margaretmary Eddy, MD). She was advised that due to constipation, we wanted to hold off on narcotic medications as they could be contributory to her reported constipation.     She was seen in clinic by Dr. Mike Gip on 12/11/2018 and did not make verbal request for pain medications.   Patient sent in communication, via [place], on 12/12/2018:     Patient's request was discussed with primary attending oncologist Mike Gip, MD). As her pain is NOT related to her underlying malignancy, the decision was made not to oblige request for Roxicodone 10 mg tablets. Additionally, patient has had a UDS on 11/19/2018 that was (+) for [facility].  Patient came into the infusion clinic on the afternoon of 12/12/2018 for Udenyca injection. She reported "severe pain from her hemorrhoids" to her primary nurse Alyse Low, RN). Prior to her arrival, patient sent the following message to her GI provider Marius Ditch, MD):   Alternative recommendations have been made to this patient. She has not tried any OTC interventions, such as the APAP and IBU combination dose, that have been suggested. She advised nurse in the infusion center that she would be "going to the ER" for pain control.    Honor Loh, MSN, APRN, FNP-C, CEN Oncology/Hematology Nurse Practitioner  [place] 12/12/18, 4:41 PM

## 2018-12-12 NOTE — Progress Notes (Signed)
Patient wanted me to let her MD know that she is continuing to have really severe pain from her hemorrhoids. She is having freq muddy water/pastey stools. The pain gets really bad after progressive bowel movements. States she may have to go the ED for pain control.

## 2018-12-13 ENCOUNTER — Other Ambulatory Visit: Payer: Self-pay

## 2018-12-13 DIAGNOSIS — K6289 Other specified diseases of anus and rectum: Secondary | ICD-10-CM

## 2018-12-13 MED ORDER — TRAMADOL HCL 50 MG PO TABS: 50.0000 mg | ORAL_TABLET | Freq: Two times a day (BID) | ORAL | 0 refills | Status: DC | PRN

## 2018-12-13 MED ORDER — HYDROCORTISONE 2.5 % RE CREA: 1.0000 "application " | TOPICAL_CREAM | Freq: Two times a day (BID) | RECTAL | 0 refills | Status: AC

## 2018-12-13 MED ORDER — TRAMADOL HCL 50 MG PO TABS: 50.0000 mg | ORAL_TABLET | Freq: Two times a day (BID) | ORAL | 0 refills | Status: AC | PRN

## 2018-12-13 NOTE — Telephone Encounter (Signed)
Patient received chemo on Wednesday.  She reported having severe diarrhea yesterday.  She had one bowel movement today.  She has significant rectal pain since onset of diarrhea.  Denies any rectal bleeding.  She has tried Preparation H with not much relief.  Her rectal pain could be multifactorial secondary to scar tissue from radiation treatment in the past or from radiation proctitis or symptomatic hemorrhoids.  Advised her to try Anusol cream.  I will give her tramadol 50 mg each, 10 pills only  [name], MD [address]  [place]  [place], [place] [address]  Main: [phone]  Fax: [phone] Pager: [phone]

## 2018-12-16 ENCOUNTER — Ambulatory Visit: Payer: Medicaid Other | Admitting: Gastroenterology

## 2018-12-16 ENCOUNTER — Emergency Department: Payer: Medicaid Other

## 2018-12-16 ENCOUNTER — Inpatient Hospital Stay: Payer: Medicaid Other

## 2018-12-16 ENCOUNTER — Telehealth: Payer: Self-pay | Admitting: *Deleted

## 2018-12-16 ENCOUNTER — Inpatient Hospital Stay: Payer: Medicaid Other | Admitting: Oncology

## 2018-12-16 ENCOUNTER — Telehealth: Payer: Self-pay

## 2018-12-16 ENCOUNTER — Other Ambulatory Visit: Payer: Self-pay | Admitting: Oncology

## 2018-12-16 ENCOUNTER — Encounter: Payer: Self-pay | Admitting: Hematology and Oncology

## 2018-12-16 ENCOUNTER — Emergency Department
Admission: EM | Admit: 2018-12-16 | Discharge: 2018-12-16 | Disposition: A | Discharge status: Home / Self Care | Payer: Medicaid Other | Attending: Emergency Medicine | Admitting: Emergency Medicine

## 2018-12-16 ENCOUNTER — Encounter: Payer: Self-pay | Admitting: Emergency Medicine

## 2018-12-16 DIAGNOSIS — K627 Radiation proctitis: Secondary | ICD-10-CM | POA: Diagnosis not present

## 2018-12-16 DIAGNOSIS — Z515 Encounter for palliative care: Secondary | ICD-10-CM | POA: Insufficient documentation

## 2018-12-16 DIAGNOSIS — Z87891 Personal history of nicotine dependence: Secondary | ICD-10-CM | POA: Diagnosis not present

## 2018-12-16 DIAGNOSIS — Z7901 Long term (current) use of anticoagulants: Secondary | ICD-10-CM | POA: Insufficient documentation

## 2018-12-16 DIAGNOSIS — R109 Unspecified abdominal pain: Secondary | ICD-10-CM | POA: Diagnosis present

## 2018-12-16 DIAGNOSIS — W908XXA Exposure to other nonionizing radiation, initial encounter: Secondary | ICD-10-CM | POA: Insufficient documentation

## 2018-12-16 DIAGNOSIS — Z79899 Other long term (current) drug therapy: Secondary | ICD-10-CM | POA: Insufficient documentation

## 2018-12-16 DIAGNOSIS — C539 Malignant neoplasm of cervix uteri, unspecified: Secondary | ICD-10-CM | POA: Insufficient documentation

## 2018-12-16 DIAGNOSIS — G893 Neoplasm related pain (acute) (chronic): Secondary | ICD-10-CM

## 2018-12-16 LAB — COMPREHENSIVE METABOLIC PANEL
ALT: 170 U/L — AB (ref 0–44)
AST: 92 U/L — ABNORMAL HIGH (ref 15–41)
Albumin: 4.3 g/dL (ref 3.5–5.0)
Alkaline Phosphatase: 333 U/L — ABNORMAL HIGH (ref 38–126)
Anion gap: 13 (ref 5–15)
BUN: 21 mg/dL — ABNORMAL HIGH (ref 6–20)
CO2: 23 mmol/L (ref 22–32)
CREATININE: 0.72 mg/dL (ref 0.44–1.00)
Calcium: 9.5 mg/dL (ref 8.9–10.3)
Chloride: 96 mmol/L — ABNORMAL LOW (ref 98–111)
GFR calc Af Amer: >60 mL/min (ref >60)
GFR calc non Af Amer: >60 mL/min (ref >60)
Glucose, Bld: 98 mg/dL (ref 70–99)
Potassium: 3.6 mmol/L (ref 3.5–5.1)
Sodium: 132 mmol/L — ABNORMAL LOW (ref 135–145)
TOTAL PROTEIN: 9 g/dL — AB (ref 6.5–8.1)
Total Bilirubin: 1.3 mg/dL — ABNORMAL HIGH (ref 0.3–1.2)

## 2018-12-16 LAB — CBC WITH DIFFERENTIAL/PLATELET
Abs Immature Granulocytes: 0.17 10*3/uL — ABNORMAL HIGH (ref 0.00–0.07)
Basophils Absolute: 0.1 10*3/uL (ref 0.0–0.1)
Basophils Relative: 1 %
Eosinophils Absolute: 0 10*3/uL (ref 0.0–0.5)
Eosinophils Relative: 0 %
HEMATOCRIT: 40.4 % (ref 36.0–46.0)
Hemoglobin: 13.6 g/dL (ref 12.0–15.0)
IMMATURE GRANULOCYTES: 4 %
Lymphocytes Relative: 13 %
Lymphs Abs: 0.6 10*3/uL — ABNORMAL LOW (ref 0.7–4.0)
MCH: 34.7 pg — ABNORMAL HIGH (ref 26.0–34.0)
MCHC: 33.7 g/dL (ref 30.0–36.0)
MCV: 103.1 fL — ABNORMAL HIGH (ref 80.0–100.0)
Monocytes Absolute: 0.1 10*3/uL (ref 0.1–1.0)
Monocytes Relative: 3 %
Neutro Abs: 3.6 10*3/uL (ref 1.7–7.7)
Neutrophils Relative %: 79 %
Platelets: 105 10*3/uL — ABNORMAL LOW (ref 150–400)
RBC: 3.92 MIL/uL (ref 3.87–5.11)
RDW: 12.1 % (ref 11.5–15.5)
SMEAR REVIEW: UNDETERMINED
WBC: 4.5 10*3/uL (ref 4.0–10.5)
nRBC: 0 % (ref 0.0–0.2)

## 2018-12-16 LAB — URINALYSIS, COMPLETE (UACMP) WITH MICROSCOPIC
Bacteria, UA: NONE SEEN
Bilirubin Urine: NEGATIVE
Glucose, UA: NEGATIVE mg/dL
Ketones, ur: 20 mg/dL — AB
Leukocytes,Ua: NEGATIVE
Nitrite: NEGATIVE
PH: 6 (ref 5.0–8.0)
Protein, ur: NEGATIVE mg/dL
Specific Gravity, Urine: >1.046 — ABNORMAL HIGH (ref 1.005–1.030)

## 2018-12-16 LAB — LIPASE, BLOOD: Lipase: 21 U/L (ref 11–51)

## 2018-12-16 MED ORDER — FENTANYL CITRATE (PF) 100 MCG/2ML IJ SOLN
50.0000 ug | INTRAMUSCULAR | Status: DC | PRN
  Administered 2018-12-16: 50 ug via NASAL

## 2018-12-16 MED ORDER — HYDROMORPHONE HCL 1 MG/ML IJ SOLN
1.0000 mg | Freq: Once | INTRAMUSCULAR | Status: AC
  Administered 2018-12-16: 1 mg via INTRAVENOUS

## 2018-12-16 MED ORDER — IOHEXOL 350 MG/ML SOLN
100.0000 mL | Freq: Once | INTRAVENOUS | Status: AC | PRN
  Administered 2018-12-16: 100 mL via INTRAVENOUS

## 2018-12-16 MED ORDER — ONDANSETRON HCL 4 MG/2ML IJ SOLN
4.0000 mg | Freq: Once | INTRAMUSCULAR | Status: AC
  Administered 2018-12-16: 4 mg via INTRAVENOUS
  Filled 2018-12-16: qty 2

## 2018-12-16 MED ORDER — HYDROMORPHONE HCL 1 MG/ML IJ SOLN
INTRAMUSCULAR | Status: AC
  Administered 2018-12-16: 1 mg via INTRAVENOUS
  Filled 2018-12-16: qty 1

## 2018-12-16 MED ORDER — MORPHINE SULFATE 2 MG/ML IJ SOLN: 2.0000 mg | Freq: Once | INTRAMUSCULAR | Status: DC

## 2018-12-16 MED ORDER — OXYCODONE HCL 5 MG PO TABS
10.0000 mg | ORAL_TABLET | Freq: Once | ORAL | Status: AC
  Administered 2018-12-16: 10 mg via ORAL
  Filled 2018-12-16: qty 2

## 2018-12-16 MED ORDER — FENTANYL CITRATE (PF) 100 MCG/2ML IJ SOLN
INTRAMUSCULAR | Status: AC
  Administered 2018-12-16: 50 ug via NASAL
  Filled 2018-12-16: qty 2

## 2018-12-16 MED ORDER — MORPHINE SULFATE (PF) 4 MG/ML IV SOLN
4.0000 mg | Freq: Once | INTRAVENOUS | Status: AC
  Administered 2018-12-16: 4 mg via INTRAVENOUS
  Filled 2018-12-16: qty 1

## 2018-12-16 MED ORDER — ACETAMINOPHEN 500 MG PO TABS
1000.0000 mg | ORAL_TABLET | Freq: Once | ORAL | Status: AC
  Administered 2018-12-16: 1000 mg via ORAL
  Filled 2018-12-16: qty 2

## 2018-12-16 MED ORDER — HEPARIN SOD (PORK) LOCK FLUSH 100 UNIT/ML IV SOLN
500.0000 [IU] | Freq: Once | INTRAVENOUS | Status: AC
  Administered 2018-12-16: 500 [IU] via INTRAVENOUS
  Filled 2018-12-16: qty 5

## 2018-12-16 MED ORDER — OXYCODONE HCL 5 MG PO TABS: 10.0000 mg | ORAL_TABLET | Freq: Three times a day (TID) | ORAL | 0 refills | Status: DC | PRN

## 2018-12-16 MED ORDER — SODIUM CHLORIDE 0.9 % IV BOLUS
1000.0000 mL | Freq: Once | INTRAVENOUS | Status: AC
  Administered 2018-12-16: 1000 mL via INTRAVENOUS

## 2018-12-16 MED ORDER — SODIUM CHLORIDE 0.9 % IV SOLN
INTRAVENOUS | Status: DC
  Filled 2018-12-16: qty 250

## 2018-12-16 NOTE — ED Provider Notes (Addendum)
This patient was signed out to me by Dr. Tilden Dome niece.  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  She presented today for pain that was not well controlled and concerns about her ability to access a pain regimen for her chronic cancer pain.  Dr. Rae Lips did place a palliative care consult and I have spoken directly with Mr. Billey Chang, the NP covering the palliative care consult.  He will be able to establish this patient in his clinic, and has requested 10 mg oxycodone every 4 hours as needed until Friday when she will be able to be seen in his clinic.  He will help her transition her oncology care, as she has requested.  At this time, I am awaiting her urinalysis results and anticipate discharge.   Eula Listen, MD 12/16/18 1612    Eula Listen, MD 12/16/18 [phone]

## 2018-12-16 NOTE — ED Triage Notes (Signed)
Patient presents to the ED with abdominal pain since Saturday that is worse with bowel movements.  Patient reports diarrhea, "for a long time."  Patient reports noticing " a little bit of blood" in stools today and yesterday.  Patient states blood is bright red.  Patient states she has metastatic carcinoma and states she last had chemo on Tuesday.  Patient states pain is lower abdominal pain.  Patient is tearful in triage.

## 2018-12-16 NOTE — ED Notes (Addendum)
First Nurse Note: Patient to ED via [facility] from [place] with abdominal pain.  Hx of metastatic cervical CA, started new chemo recently.  Patient alert and oriented, crying.  On blood thinner - Eloquis, Hx of PE.  Factor V leiden.

## 2018-12-16 NOTE — Progress Notes (Signed)
IV fluids and pain medication ordered

## 2018-12-16 NOTE — Telephone Encounter (Signed)
Patient called in tears requesting to come in as she is sick and weak, and thinks she needs IV fluids. Discussed with Lorretta Harp, NP and appointment accepted to come in now for IV fluids, lab and Sonia Baller will see patient in infusion area

## 2018-12-16 NOTE — Telephone Encounter (Signed)
Patient contacted office complaining of diarrhea, excruciating discomfort with bowel movements and stated she has been sick since Friday's chemotherapy.  Patient was very tearful due to the pain she is currently experiencing.  Patient has been advised to go to ER.  Dr. Marius Ditch has been notified of this message by phone.  Thanks [employer]

## 2018-12-16 NOTE — ED Notes (Signed)
Patient states preference for blood drawn from her port.  Patient states, "my veins are really bad."  This RN explained that patient's port would need to be accessed in a patient care room.  Patient understood.  Patient moved to family room with mask.

## 2018-12-16 NOTE — Consult Note (Signed)
[place]  [phone] [phone]   Name: Joanna Hall Date: 12/16/2018 MRN: [id number]  DOB: [birth year]  Patient Care Team: Arnetha Courser, MD as PCP - General (Family Medicine) Mellody Drown, MD as Consulting Physician (Obstetrics and Gynecology) Lenor Coffin, MD as Attending Physician (Gynecology) Lequita Asal, MD as Medical Oncologist (Hematology and Oncology) [name], MD as Consulting Physician (Gastroenterology) Michael Boston, MD as Consulting Physician (General Surgery) Lovell Sheehan, MD as Consulting Physician (Orthopedic Surgery) Clent Jacks, RN as Registered Nurse    REASON FOR CONSULTATION: Palliative Care consult requested for this 53 y.o. 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  PMH also notable for history of right hemicolectomy with chronic inflammatory changes to the colon.  Patient is followed by GI.  She was recently hospitalized 11/19/2020 11/25/2018 with GI bleed.  Patient underwent flex sig on 2/6 but was mostly unremarkable except edematous mucosa.  Patient has chronic abdominal pain.  She presented to the ER on 12/16/2018 with acute on chronic intractable abdominal pain.  Patient had CT angios of the abdomen pelvis, which revealed mucosal enhancement and wall thickening in the rectum and sigmoid colon but was essentially unchanged from previous studies.  Palliative care was consulted to help address symptoms and goals.  SOCIAL HISTORY:     reports that she quit smoking about 12 years ago. She has never used smokeless tobacco. She reports previous alcohol use. She reports current drug use. Drug: Marijuana.   Patient is not married.  She lives at home with her mother and father.  She has an adult daughter who lives in [place], [place].  She had  another daughter who is now deceased.  ADVANCE DIRECTIVES:  Not on file  CODE STATUS:   PAST MEDICAL HISTORY: Past Medical History:  Diagnosis Date  . Abdominal pain 06/10/2018  . Abnormal cervical Papanicolaou smear 09/18/2017  . Anxiety   . Aortic atherosclerosis ([place])   . Arthritis    neck and knees  . 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 Blood transfusion without reported diagnosis   . Cervical cancer ([place]) 09/2016   mets lung  . Chronic anal fissure   . Chronic diarrhea   . Dyspnea   . Erosive gastropathy 09/18/2017  . Factor V Leiden mutation ([place])   . Fecal incontinence   . Genital warts   . GERD (gastroesophageal reflux disease)   . Heart murmur   . Hematochezia   . Hemorrhoids   . 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 Hepatitis, chronic ([place]) 05/05/2017  . History of cancer chemotherapy    completed 06/2017  . History of Clostridium difficile infection    while undergoing chemo.  Negative test 10/2017  . Ileocolic anastomotic leak   . 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 Intestinal infection due to Clostridium difficile 09/18/2017  . Macrocytic anemia with vitamin B12 deficiency   . Multiple gastric ulcers   . Nausea vomiting and diarrhea   . Pancolitis ([place]) 07/27/2018  . Perianal condylomata   . Pneumonia    History of  . Pulmonary nodules   . Rectal bleeding   . Small bowel obstruction ([place]) 08/2017  . Stiff neck    limited right turn  .  Vitamin D deficiency     PAST SURGICAL HISTORY:  Past Surgical History:  Procedure Laterality Date  . CHOLECYSTECTOMY    . COLON SURGERY  08/2017   resection  . COLONOSCOPY WITH PROPOFOL N/A 12/20/2017   Procedure: COLONOSCOPY WITH PROPOFOL;  Surgeon: [name], MD;  Location: [facility] ENDOSCOPY;  Service: Gastroenterology;  Laterality: N/A;  . COLONOSCOPY WITH PROPOFOL N/A 07/30/2018   Procedure: COLONOSCOPY WITH PROPOFOL;  Surgeon: [name], MD;  Location: [facility] ENDOSCOPY;  Service: Gastroenterology;  Laterality: N/A;  . COLONOSCOPY WITH PROPOFOL N/A 10/10/2018   Procedure: COLONOSCOPY WITH PROPOFOL;  Surgeon: Lucilla Lame, MD;  Location: [facility] ENDOSCOPY;  Service: Endoscopy;  Laterality: N/A;  . DIAGNOSTIC LAPAROSCOPY    . ESOPHAGOGASTRODUODENOSCOPY (EGD) WITH PROPOFOL N/A 12/20/2017   Procedure: ESOPHAGOGASTRODUODENOSCOPY (EGD) WITH PROPOFOL;  Surgeon: [name], MD;  Location: [place];  Service: Gastroenterology;  Laterality: N/A;  . ESOPHAGOGASTRODUODENOSCOPY (EGD) WITH PROPOFOL  07/30/2018   Procedure: ESOPHAGOGASTRODUODENOSCOPY (EGD) WITH PROPOFOL;  Surgeon: [name], MD;  Location: [facility] ENDOSCOPY;  Service: Gastroenterology;;  . Otho Darner SIGMOIDOSCOPY N/A 11/21/2018   Procedure: FLEXIBLE SIGMOIDOSCOPY;  Surgeon: [name], MD;  Location: [facility] ENDOSCOPY;  Service: Gastroenterology;  Laterality: N/A;  . LAPAROTOMY N/A 08/31/2017   Procedure: EXPLORATORY LAPAROTOMY for SBO, ileocolectomy, removal of piece of uterine wall;  Surgeon: [name], MD;  Location: [facility] ORS;  Service: General;  Laterality: N/A;  . LASER ABLATION CONDOLAMATA N/A 02/22/2018   Procedure: LASER ABLATION/REMOVAL OF [id number] AROUND ANUS AND VAGINA;  Surgeon: Michael Boston, MD;  Location: [place];  Service: General;  Laterality: N/A;  . OOPHORECTOMY    . PORTA CATH INSERTION N/A 05/13/2018   Procedure: PORTA CATH INSERTION;  Surgeon: Katha Cabal, MD;  Location: [facility] CV LAB;  Service: Cardiovascular;  Laterality: N/A;  . SMALL INTESTINE SURGERY    . TANDEM RING INSERTION     x3  . THORACOTOMY    . TOTAL KNEE ARTHROPLASTY Left 04/24/2018   Procedure: TOTAL KNEE ARTHROPLASTY;  Surgeon: Lovell Sheehan, MD;  Location: [facility] ORS;  Service: Orthopedics;  Laterality: Left;    HEMATOLOGY/ONCOLOGY HISTORY:  Oncology History   Patient was diagnosed with cervical adenocarcinoma in  [place] in 09/2016.  She has had a long-standing history of abnormal Pap smears.  She presented with an ovarian cyst.  Laparoscopic surgery was pursued but was difficult due to scar tissue.  Had BSO and rupture of cyst with purulent drainage into the abdomen.  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  Radical hysterectomy was aborted.  She was treated by Dr. Christene Slates at [facility] in [place], [place].  Decision was made to pursue concurrent chemotherapy (weekly cisplatin) and radiation.  She received treatment from 11/2016-05/2017.  01/2017 cisplatin x 2 and carboplatin x 1 (01/29/2017) due to ARF and XRT.  XRT was followed by T & O on 02/01/2017 and T & N 02/10/2017 and 02/20/2017.  Course was complicated by 80 pound weight loss, nausea, vomiting, electrolyte wasting (potassium and magnesium).  She describes that.  Is been sick constantly requiring at least 20 hospitalizations.  Follow-up CT chest and PET on 06/2017. Per patient, 'radiation worked' and no disease in the abdomen.  At that time she was noted to have lung nodules that were growing and follow-up imaging was scheduled for 10/2017.  She was admitted to hospital in [place] for small bowel obstruction which was managed conservatively and  she was home for a week prior to traveling to [place] for Thanksgiving holiday where she has family.  She presented to ER in [place] on 08/2017 with nausea, vomiting, and lower abdominal pain.  Symptoms did not respond to conservative treatment.  CT on 08/26/2017 revealed small bowel obstruction with transition in the pelvis just superior to the uterus rather was a long segment of distal ileum with fatty wall thickening compatible with chronic inflammation and/or radiation enteritis. Imaging showed numerous pulmonary nodules consistent with metastatic disease. She underwent laparotomy and right ileocolectomy on 08/31/2017 at [facility].  Surgical findings  revealed a thickened, matted, and scarred piece of distal small bowel close to the ileocecal valve.  She was discharged on 09/05/2017.  Pain markedly increased in intensity and imaging was performed on 09/11/2017 which revealed: Debris within the anterior abdominal wall incision concerning for infection versus packing material, s/p post ileo-colectomy with expected postoperative changes, mild colonic ileus, numerous pulmonary nodules highly concerning for metastatic disease, punctate nonobstructing nephrolithiasis.  Staples were removed and one was packed.  She was started on doxycycline.  Abdominal and pelvic CT without contrast on 09/11/2017 revealed debris within anterior abdominal wall incision concerning for infection, versus packing material.She is s/pileocolectomy with expected postoperative changes and mild colonic ileus. There were numerous pulmonary nodules highly concerning for metastatic diseaseand punctate nonobstructing nephrolithiasis. She was readmitted on 09/12/2017. She describes the onset of lower abdominal pain on 09/09/2017. Pain markedly increased in intensity on 09/11/2017.  Staples were removed and the wound packed. She was started on doxycycline.  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  PET scan was ordered to evaluate enlarging lung nodules with concern for recurrent cervical cancer but scan was delayed due to insurance and need to be performed in [place].  Presented to ER on 12/03/2017 for abdominal pain and emesis.  Imaging concerning for worsening possible uterine infarct and she  was admitted to hospital.  Pelvic MRI was unremarkable.  Remote scarring type changes of uterus thought to be possibly related to radiation.  She was discharged on 12/08/2017.  Underwent endoscopy and colonoscopy on 12/20/2017.    On 02/22/2018 she underwent laser ablation of condylomata around the anus and vagina under anesthesia with Dr. Johney Maine.   04/15/2018: Chest, abdomen, and pelvis CTrevealed innumerable (>100) cavitary nodules scattered in the lungs, moderately enlarging compared to the 11/08/2017 PET-CT, suspicious for metastatic disease. One index node in the RLL measures 1.0 x 1.1 cm (previously 0.6 x 0.6 cm). There were no new nodules. There was an ill-defined wall thickening in the rectosigmoid with surrounding stranding along fascia planes, probably sequela from prior radiation therapy. There was multilevel lumbar impingement due to spondylosis and degenerative disc disease. There was heterogeneous enhancement in the uterus (some possibly from prior radiation therapy).  04/23/2018:PET scan revealednumerous scattered solid and cavitary nodules in the lungs stable increased in size compared to the prior PET-CT from 11/08/2017.Largest nodule was 1.1 cm in the LUL (SUV 1.9).These demonstratedlow-grade metabolic activity up to a maximum SUV of about 2.3, increased from 11/08/2017.   Case was discussed at tumor board on 04/25/2018. Consensus to pursue CT-guided biopsy (05/06/18)  which revealed: Metastatic adenocarcinoma, morphologically consistent with cervical adenocarcinoma.  She has history of chronic hepatitis C which is managed by GI.  Hepatitis C genotype is 2a/2c.  She receives B12 injections for history of B12 deficiency.  On 04/24/2018 she underwent left total knee replacement with Dr. [name].     Malignant neoplasm of overlapping sites of cervix ([place])   09/18/2017 Initial Diagnosis    Malignant neoplasm of overlapping sites of cervix ([place])    06/04/2018 -  Chemotherapy     The patient had palonosetron (ALOXI) injection 0.25 mg, 0.25 mg, Intravenous,  Once, 6 of 10 cycles Administration: 0.25 mg (06/04/2018), 0.25 mg (06/25/2018), 0.25 mg (09/30/2018), 0.25 mg (12/11/2018), 0.25 mg (07/23/2018), 0.25 mg (09/03/2018) pegfilgrastim (NEULASTA) injection 6 mg, 6 mg, Subcutaneous, Once, 6 of 10 cycles Administration: 6 mg (06/06/2018), 6 mg (06/26/2018), 6 mg (07/24/2018), 6 mg (09/04/2018), 6 mg (10/01/2018) CARBOplatin (PARAPLATIN) 500 mg in sodium chloride 0.9 % 250 mL chemo infusion, 500 mg (100 % of original dose 497.2 mg), Intravenous,  Once, 6 of 10 cycles Dose modification:   (original dose 497.2 mg, Cycle 1, Reason: Provider Judgment, Comment: difficulty with counts with initial chemo in [place]; advance dose as tolerated),   (original dose 497.2 mg, Cycle 5, Reason: Provider Judgment, Comment: return back to original dose) Administration: 500 mg (06/04/2018), 560 mg (06/25/2018), 500 mg (09/30/2018), 500 mg (12/11/2018), 500 mg (07/23/2018) PACLitaxel (TAXOL) 264 mg in sodium chloride 0.9 % 250 mL chemo infusion (> 22m/m2), 140 mg/m2 = 264 mg (100 % of original dose 140 mg/m2), Intravenous,  Once, 6 of 10 cycles Dose modification: 140 mg/m2 (original dose 140 mg/m2, Cycle 1, Reason: Provider Judgment, Comment: difficulty with [place] dose as tolerated), 155 mg/m2 (original dose 140 mg/m2, Cycle 2, Reason: Provider Judgment, Comment: advance as tolerated), 155 mg/m2 (original dose 175 mg/m2, Cycle 5, Reason: Provider Judgment, Comment: continue current dose) Administration: 264 mg (06/04/2018), 294 mg (06/25/2018), 294 mg (09/30/2018), 294 mg (12/11/2018), 294 mg (07/23/2018), 294 mg (09/03/2018)  for chemotherapy treatment.      ALLERGIES:  is allergic to ketamine.  MEDICATIONS:  Current Facility-Administered Medications  Medication Dose Route Frequency Provider Last Rate Last Dose  . acetaminophen (TYLENOL) tablet 1,000 mg   1,000 mg Oral Once VDar[place] CKent[place] MD      . oxyCODONE (Oxy IR/ROXICODONE) immediate release tablet 10 mg  10 mg Oral Once VRudene Re MD       Current Outpatient Medications  Medication Sig Dispense Refill  . amitriptyline (ELAVIL) 75 MG tablet Take 1 tablet (75 mg total) by mouth at bedtime. 90 tablet 1  . budesonide (ENTOCORT EC) 3 MG 24 hr capsule Take 3 capsules (9 mg total) by mouth daily. 30 capsule 0  . Calcium Carb-Cholecalciferol (CALCIUM 500 +D) 500-400 MG-UNIT TABS Take 2 tablets by mouth daily.    . diphenoxylate-atropine (LOMOTIL) 2.5-0.025 MG tablet Take 2 tablets by mouth 4 (four) times daily. (Patient not taking: Reported on 12/11/2018) 30 tablet 0  . famotidine (PEPCID) 20 MG tablet Take 1 tablet (20 mg total) by mouth 2 (two) times daily as needed for heartburn or indigestion.    . feeding supplement, ENSURE ENLIVE, (ENSURE ENLIVE) LIQD Take 237 mLs by mouth 2 (two) times daily between meals. 237 mL 12  . hydrocortisone (ANUSOL-HC) 2.5 % rectal cream Place 1 application rectally 2 (two) times daily for 10 days. 28 g 0  . hydrocortisone (CORTEF) 10 MG tablet  Take 1 tablet (10 mg total) by mouth 2 (two) times daily. 180 tablet 0  . hyoscyamine (LEVBID) 0.375 MG 12 hr tablet Take 1 tablet (0.375 mg total) by mouth 2 (two) times daily. 60 tablet 0  . Multiple Vitamins-Minerals (MULTIVITAMIN WITH MINERALS) tablet Take 1 tablet by mouth daily. 30 tablet 0  . ondansetron (ZOFRAN) 4 MG tablet Take 1 tablet (4 mg total) by mouth every 6 (six) hours as needed for nausea. (Patient not taking: Reported on 12/11/2018) 20 tablet 0  . ondansetron (ZOFRAN) 8 MG tablet Take 1 tablet (8 mg total) by mouth every 8 (eight) hours as needed for nausea or vomiting. (Patient not taking: Reported on 11/28/2018) 30 tablet 1  . oxyCODONE (ROXICODONE) 5 MG immediate release tablet Take 2 tablets (10 mg total) by mouth every 8 (eight) hours as needed. 30 tablet 0  . pantoprazole (PROTONIX) 40 MG  tablet Take 1 tablet (40 mg total) by mouth daily. 90 tablet 0  . potassium chloride SA (K-DUR,KLOR-CON) 20 MEQ tablet Take 1 tablet (20 mEq total) by mouth daily. for 4 days. 30 tablet 0  . rifaximin (XIFAXAN) 550 MG TABS tablet Take by mouth.    . rivaroxaban (XARELTO) 20 MG TABS tablet Take 20 mg by mouth daily with supper.    . traMADol (ULTRAM) 50 MG tablet Take 1 tablet (50 mg total) by mouth every 12 (twelve) hours as needed for up to 5 days for severe pain. 10 tablet 0  . VENTOLIN HFA 108 (90 Base) MCG/ACT inhaler Inhale 1-2 puffs into the lungs every 4 (four) hours as needed for shortness of breath.   0  . VIBERZI 100 MG TABS TAKE 1 TABLET BY MOUTH TWICE DAILY WITH  A  MEAL 60 tablet 0   Facility-Administered Medications Ordered in Other Encounters  Medication Dose Route Frequency Provider Last Rate Last Dose  . heparin lock flush 100 unit/mL  500 Units Intravenous Once Corcoran, Melissa C, MD      . sodium chloride 0.9 % 1,000 mL with potassium chloride 20 mEq infusion   Intravenous Once Karen Kitchens, NP        VITAL SIGNS: BP (!) 131/99   Pulse (!) 106   Temp 98.6 F (37 C) (Oral)   Resp (!) 22   Ht 5' 8"  (1.727 m)   Wt 168 lb (76.2 kg)   SpO2 100%   BMI 25.54 kg/m  Filed Weights   12/16/18 1054  Weight: 168 lb (76.2 kg)    Estimated body mass index is 25.54 kg/m as calculated from the following:   Height as of this encounter: 5' 8"  (1.727 m).   Weight as of this encounter: 168 lb (76.2 kg).  LABS: CBC:    Component Value Date/Time   WBC 4.5 12/16/2018 1303   HGB 13.6 12/16/2018 1303   HCT 40.4 12/16/2018 1303   PLT 105 (L) 12/16/2018 1303   MCV 103.1 (H) 12/16/2018 1303   NEUTROABS 3.6 12/16/2018 1303   LYMPHSABS 0.6 (L) 12/16/2018 1303   MONOABS 0.1 12/16/2018 1303   EOSABS 0.0 12/16/2018 1303   BASOSABS 0.1 12/16/2018 1303   Comprehensive Metabolic Panel:    Component Value Date/Time   NA 132 (L) 12/16/2018 1250   K 3.6 12/16/2018 1250   CL 96 (L)  12/16/2018 1250   CO2 23 12/16/2018 1250   BUN 21 (H) 12/16/2018 1250   CREATININE 0.72 12/16/2018 1250   GLUCOSE 98 12/16/2018 1250   CALCIUM 9.5  12/16/2018 1250   AST 92 (H) 12/16/2018 1250   ALT 170 (H) 12/16/2018 1250   ALKPHOS 333 (H) 12/16/2018 1250   BILITOT 1.3 (H) 12/16/2018 1250   PROT 9.0 (H) 12/16/2018 1250   ALBUMIN 4.3 12/16/2018 1250    RADIOGRAPHIC STUDIES: Dg Abd 1 View  Result Date: 11/26/2018 CLINICAL DATA:  53 year old female. Evaluate for retained capsule. Subsequent encounter. EXAM: ABDOMEN - 1 VIEW COMPARISON:  11/25/2018. FINDINGS: Radiopaque foreign body right lower quadrant of the abdomen consistent with bowel camera. This has shifted position from the prior examination now projecting at the level of the cecum/terminal ileum level. Post cholecystectomy. Decreased amount of stool compared to yesterday's exam. Scoliosis lumbar spine with superimposed degenerative changes. IMPRESSION: Radiopaque foreign body right lower quadrant of the abdomen consistent with bowel camera. This has shifted position from the prior examination now projecting at the level of the cecum/terminal ileum level. Electronically Signed   By: Genia Del M.D.   On: 11/26/2018 13:00   Dg Abd 1 View  Result Date: 11/25/2018 CLINICAL DATA:  Intermittent episodes of diarrhea, progressively worsening. Dizziness and GI bleed, sent to hospital as a direct admission. RIGHT lower quadrant abdominal pain radiating to LEFT side of lower abdomen. Known history of GERD, gastric ulcers, stage IV cervical cancer with last chemotherapy on 09/30/2018. EXAM: ABDOMEN - 1 VIEW COMPARISON:  None. FINDINGS: Overall bowel gas pattern is nonobstructive. No dilated small bowel loops seen. Fairly large amount of stool throughout the large bowel with associated mild distention of the RIGHT colon/transverse colon. Metallic object again projects over the RIGHT lower abdomen, presumed bowel camera. Cholecystectomy clips in the  RIGHT upper quadrant. No evidence of soft tissue mass or abnormal fluid collection. No evidence of free intraperitoneal air. No evidence of renal or ureteral calculi. Degenerative changes within the scoliotic lumbar spine, moderate in degree. No acute appearing osseous abnormality. IMPRESSION: 1. Nonobstructive bowel gas pattern. Fairly large amount of stool throughout the large bowel with associated mild distention of the RIGHT colon/transverse colon. 2. Presumed bowel camera within the RIGHT lower quadrant. Electronically Signed   By: Franki Cabot M.D.   On: 11/25/2018 12:48   Dg Abd 1 View  Result Date: 11/21/2018 CLINICAL DATA:  Diarrhea. EXAM: ABDOMEN - 1 VIEW COMPARISON:  10/18/2018. FINDINGS: Normal bowel gas pattern. Stable right pelvic phleboliths, cholecystectomy clips and right mid abdominal surgical staples. Interval 1.4 cm metallic object projected over the right mid abdomen to the right of the lumbar spine. Extensive lumbar spine degenerative changes and mild lower thoracic spine degenerative changes. Mild to moderate levoconvex lumbar scoliosis. IMPRESSION: 1. Interval 1.4 cm metallic object projected over the right mid abdomen. This could represent an ingested object such as a small bowel camera. This could also be external to the patient. 2. No acute abnormality. Electronically Signed   By: Claudie Revering M.D.   On: 11/21/2018 18:39   Ct Abdomen Pelvis W Contrast  Result Date: 11/26/2018 CLINICAL DATA:  Lower GI bleeding. EXAM: CT ABDOMEN AND PELVIS WITH CONTRAST TECHNIQUE: Multidetector CT imaging of the abdomen and pelvis was performed using the standard protocol following bolus administration of intravenous contrast. CONTRAST:  172m OMNIPAQUE IOHEXOL 300 MG/ML  SOLN COMPARISON:  KUB from earlier today. Chest CT 04/15/2018. Abdomen and pelvis CT 10/17/2018 FINDINGS: Lower chest: Numerous bilateral pulmonary nodules are identified and similar to multiple prior studies. Index 11 mm nodule  identified right lower lobe on 03/04. Index nodule left lower lobe on 02/04 measures 7  mm. No pleural effusion. Hepatobiliary: No suspicious focal abnormality within the liver parenchyma. Gallbladder surgically absent. Trace intrahepatic biliary duct dilatation evident. Extrahepatic common duct measures 13 mm diameter. Common bile duct in the head of the pancreas is 9 mm diameter. Pancreas: No focal mass lesion. No dilatation of the main duct. No intraparenchymal cyst. No peripancreatic edema. Spleen: No splenomegaly. No focal mass lesion. Adrenals/Urinary Tract: No adrenal nodule or mass. Tiny nonobstructing renal stones noted bilaterally (coronal image 53/5 right kidney and coronal image 60/5 left kidney). No evidence for hydroureter. The urinary bladder appears normal for the degree of distention. Stomach/Bowel: Stomach is unremarkable. No gastric wall thickening. No evidence of outlet obstruction. Duodenum is normally positioned as is the ligament of Treitz. No small bowel wall thickening. No small bowel dilatation. Patient appears to be status post right colectomy. Capsule endoscope is identified in the region of the ileocolic anastomosis. No bowel dilatation proximal to the level of the capsule. Wall thickening noted in the sigmoid colon with the similar appearance of the stranding around the sigmoid colon. Vascular/Lymphatic: There is abdominal aortic atherosclerosis without aneurysm. There is no gastrohepatic or hepatoduodenal ligament lymphadenopathy. No intraperitoneal or retroperitoneal lymphadenopathy. Small retroperitoneal lymph nodes are stable. No pelvic sidewall lymphadenopathy. Reproductive: Stable appearance of the uterus and cervix. Other: No substantial intraperitoneal free fluid Musculoskeletal: Edema noted in the pelvic floor, as before. No worrisome lytic or sclerotic osseous abnormality. IMPRESSION: 1. Status post right hemicolectomy. Capsule endoscope noted in the region of the ileocolic  anastomosis. Difficult to definitively localize the capsule due to streak artifact from the metallic components, but it does appear distal to the anastomosis. No evidence for small bowel obstruction. 2. Persistent mild wall thickening in the sigmoid colon with edema/stranding in the surrounding fat in soft tissues of the pelvic floor. Imaging features likely treatment related in this patient with a history of cervical cancer. 3. Stable intra and extrahepatic biliary duct dilatation in this patient status post cholecystectomy. 4. Stable appearance of numerous bilateral pulmonary nodules consistent with metastatic disease. 5.  Aortic Atherosclerois (ICD10-170.0) Electronically Signed   By: Misty Stanley M.D.   On: 11/26/2018 14:08   Ct Angio Abd/pel W And/or Wo Contrast  Result Date: 12/16/2018 CLINICAL DATA:  53 year old with abdominal pain. Abdominal pain is worse with bowel movements. History of cervical cancer. EXAM: CT ANGIOGRAPHY ABDOMEN AND PELVIS WITH CONTRAST AND WITHOUT CONTRAST TECHNIQUE: Multidetector CT imaging of the abdomen and pelvis was performed using the standard protocol during bolus administration of intravenous contrast. Multiplanar reconstructed images and MIPs were obtained and reviewed to evaluate the vascular anatomy. CONTRAST:  166m OMNIPAQUE IOHEXOL 350 MG/ML SOLN COMPARISON:  CT abdomen pelvis 11/26/2018 FINDINGS: VASCULAR Aorta: Normal caliber of the abdominal aorta with atherosclerotic disease. Negative for an aortic dissection. Celiac: Patent without evidence of aneurysm, dissection, vasculitis or significant stenosis. Mild atherosclerotic disease. SMA: Patent without evidence of aneurysm, dissection, vasculitis or significant stenosis. Renals: Both renal arteries are patent without evidence of aneurysm, dissection, vasculitis, fibromuscular dysplasia or significant stenosis. IMA: IMA is patent. Inflow: Patent without evidence of aneurysm, dissection, vasculitis or significant  stenosis. Proximal Outflow: Proximal femoral arteries are patent bilaterally. Veins: Portal venous system is patent. Normal appearance of the IVC and renal veins. Iliac veins are patent. Review of the MIP images confirms the above findings. NON-VASCULAR Lower chest: Again noted are innumerable pulmonary nodules compatible with metastatic disease. Many of the lesions have central cavitation. No pleural effusions. Hepatobiliary: Cholecystectomy. Chronic extrahepatic biliary dilatation  measuring up to 1.4 cm and minimally changed. Main portal venous system is patent. Mild intrahepatic biliary dilatation. No suspicious liver lesions. Pancreas: Unremarkable. No pancreatic ductal dilatation or surrounding inflammatory changes. Spleen: Normal in size without focal abnormality. Adrenals/Urinary Tract: Normal adrenal glands. Normal appearance of both kidneys without hydronephrosis. Difficult to exclude mild bladder wall thickening but minimally changed from the previous examination. Stomach/Bowel: Normal appearance of the stomach and duodenum. Right hemicolectomy. Mucosal enhancement and wall thickening involving the rectum and sigmoid colon. There is no significant small bowel wall thickening. Negative for a bowel obstruction. Lymphatic: No lymph node enlargement in the abdomen or pelvis. Reproductive: Stable appearance of the uterus and adnexal structures. Other: Stable stranding or edema in the pelvic region, including the presacral region. These findings are similar to the previous examination. Negative for free air. Musculoskeletal: Significant disc space narrowing and endplate disease at C5-E5 and L4-L5. Disc space narrowing at L1-L2. IMPRESSION: VASCULAR 1. Mild atherosclerotic disease the aorta and visceral arteries. No significant visceral artery stenosis. Negative for an aortic aneurysm. 2. Venous structures are unremarkable. NON-VASCULAR 1. Mucosal enhancement and wall thickening in the rectum and sigmoid colon.  Findings are similar to the previous examination and probably represent chronic inflammation related to cervical cancer treatment. Stable edema or stranding in the pelvis. 2. Right hemicolectomy. 3. Innumerable pulmonary nodules compatible with metastatic disease. 4. Stable biliary dilatation, status post cholecystectomy. Electronically Signed   By: Markus Daft M.D.   On: 12/16/2018 14:48    PERFORMANCE STATUS (ECOG) : 1 - Symptomatic but completely ambulatory  Review of Systems As noted above. Otherwise, a complete review of systems is negative.  Physical Exam General: NAD, frail appearing Cardiovascular: regular rate and rhythm Pulmonary: clear ant fields Abdomen: Healed surgical scars, soft abdomen, bowel sounds present, patient grimaces with palpation Extremities: no edema, no joint deformities Skin: no rashes Neurological: Weakness but otherwise nonfocal  IMPRESSION: I met with patient and her mother in the ER.  Introduced palliative care services and attempted to establish therapeutic rapport.  Patient says that she has chronic abdominal pain that severely limits her quality of life.  She says that the pain averages 7 to 10+ out of 10.  She characterizes it as varying with being at times sharp or dull or crampy.  Pain is currently rated as 8 out of 10.  Patient says she is overall feeling better following 4 mg of IV morphine, 1 mg of IV hydromorphone, and 50 mcg of IV fentanyl.  Patient says she was discharged home from her most recent hospitalization with oxycodone 10 mg tablets and that those were effective at controlling her pain.  However, she has had difficulty finding a prescriber to refill those medications and has most recently been taking tramadol 50 mg every 6 hours as needed.  Patient says the tramadol has been entirely ineffective.  Pain is thought to be likely multifactorial secondary to scar tissue from radiation treatment or from radiation proctitis or possibly  symptomatic hemorrhoids.  Case discussed with ER attending.  Patient will be discharged home with oxycodone 10 mg tablets and she will follow-up with me later this week in the clinic.  Both patient and mother have questions related to her prognosis and future treatment plan.  Patient says that she is not sure she wants further treatment if her life expectancy were felt to be poor for her quality of life was impacted by ongoing pain.  I reviewed with her imaging including the last chest  CT in December 2019, which revealed disease improvement.  I encouraged her to speak with her oncologist regarding her questions.  I will also plan to follow-up with her in the clinic to address goals.  Case discussed with supervising physician.  PLAN: Agree with oxycodone 10 mg every 4 to 6 hours as needed We will schedule an appointment for her to see me on 12/20/2018   Patient expressed understanding and was in agreement with this plan. She also understands that She can call clinic at any time with any questions, concerns, or complaints.     Time Total: 50 minutes  Visit consisted of counseling and education dealing with the complex and emotionally intense issues of symptom management and palliative care in the setting of serious and potentially life-threatening illness.Greater than 50%  of this time was spent counseling and coordinating care related to the above assessment and plan.  Signed by: Altha Harm, PhD, NP-C [phone] (Work Cell)

## 2018-12-16 NOTE — ED Provider Notes (Signed)
[facility] Emergency Department Provider Note  ____________________________________________  Time seen: Approximately 1:55 PM  I have reviewed the triage vital signs and the nursing notes.   HISTORY  Chief Complaint Abdominal Pain   HPI Joanna Hall is a 53 y.o. female with a history of metastatic cervical cancer, SBO status post partial colectomy, uterine and renal infarct, factor V Leiden on Xarelto who presents for evaluation of abdominal pain.  Patient has been having abdominal pain for several months.  Has been evaluated by GI and oncology.  Her pain is most likely multifactorial based on metastatic cancer, uterine infarct, and recent colonoscopy showing mucosal changes from chemo/radiation.  Patient was not receiving any treatment for the last 3 months because of her abdominal pain.  She went for chemotherapy a week ago and since then her pain has become severe.  She reports that the pain is the worst he has ever been.  She is complaining of diffuse sharp abdominal pain, 10 out of 10, constant and nonradiating.  She has had several episodes of nonbloody nonbilious emesis and diarrhea.  She reports dark green emesis and stool.  She has seen a few streaks of blood in her stool but no melena or coffee-ground emesis.  No fever or chills, no dysuria or hematuria.  She went to her pain clinic today and was sent to the emergency room for evaluation.  Patient only has tramadol at home for pain.  Past Medical History:  Diagnosis Date  . Abdominal pain 06/10/2018  . Abnormal cervical Papanicolaou smear 09/18/2017  . Anxiety   . Aortic atherosclerosis ([place])   . Arthritis    neck and knees  . 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 Blood transfusion without reported diagnosis   . Cervical cancer ([place]) 09/2016   mets lung  . Chronic anal fissure   . Chronic diarrhea   . Dyspnea   . Erosive gastropathy 09/18/2017  . Factor V Leiden mutation  ([place])   . Fecal incontinence   . Genital warts   . GERD (gastroesophageal reflux disease)   . Heart murmur   . Hematochezia   . Hemorrhoids   . 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 Hepatitis, chronic ([place]) 05/05/2017  . History of cancer chemotherapy    completed 06/2017  . History of Clostridium difficile infection    while undergoing chemo.  Negative test 10/2017  . Ileocolic anastomotic leak   . Infarction of kidney ([place]) left kidney   and uterus  . Intestinal infection due to Clostridium difficile 09/18/2017  . Macrocytic anemia with vitamin B12 deficiency   . Multiple gastric ulcers   . Nausea vomiting and diarrhea   . Pancolitis ([place]) 07/27/2018  . Perianal condylomata   . Pneumonia    History of  . Pulmonary nodules   . Rectal bleeding   . Small bowel obstruction ([place]) 08/2017  . Stiff neck    limited right turn  . Vitamin D deficiency     Patient Active Problem List   Diagnosis Date Noted  . Encounter for monitoring opioid maintenance therapy 11/19/2018  . GI bleed 10/08/2018  . Adrenal insufficiency ([place]) 09/03/2018  . Chronic diarrhea 06/25/2018  . Chronic anticoagulation 06/25/2018  . Vomiting   . Encounter for antineoplastic chemotherapy 06/04/2018  . 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 Closed fracture of distal  end of radius 05/04/2018  . History of total knee arthroplasty 04/24/2018  . Osteoarthritis of left knee 02/28/2018  . Condyloma acuminatum of anus s/p ablation 02/22/2018 02/22/2018  . Condyloma acuminatum of vagina s/p ablation 02/22/2018 02/22/2018  . Positive ANA (antinuclear antibody) 02/04/2018  . Aortic atherosclerosis ([place]) 01/15/2018  . Elevated MCV 01/15/2018  . Anemia 01/15/2018  . Genital warts 01/15/2018  . Vitamin D deficiency 01/15/2018  . Pernicious anemia   . Impingement syndrome of shoulder region 12/19/2017  . Multiple joint pain 12/19/2017  . Osteoarthritis  of right knee 12/19/2017  . B12 deficiency 12/11/2017  . Lung nodules 12/11/2017  . History of Clostridium difficile infection 10/23/2017  . Factor V Leiden ([place]) 10/19/2017  . Goals of care, counseling/discussion 10/19/2017  . Cervical cancer, FIGO stage IB1 ([place]) 09/18/2017  . Chronic hepatitis C without hepatic coma ([place]) 09/18/2017  . Cytopenia 09/18/2017  . Diarrhea 09/18/2017  . Lower abdominal pain 09/18/2017  . 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 Malignant neoplasm of overlapping sites of cervix ([place]) 09/18/2017  . Renal insufficiency 09/18/2017  . Wound infection after surgery 09/12/2017  . Hypokalemia   . Hypomagnesemia   . Cervical arthritis 07/18/2017  . Dysuria 06/20/2017  . Metastatic cancer ([place]) 05/12/2017  . Essential hypertension 03/15/2017  . Anemia in other chronic diseases classified elsewhere 03/01/2017  . Chemotherapy-induced neutropenia ([place]) 01/29/2017  . 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[place]) 09/25/2016    Past Surgical History:  Procedure Laterality Date  . CHOLECYSTECTOMY    . COLON SURGERY  08/2017   resection  . COLONOSCOPY WITH PROPOFOL N/A 12/20/2017   Procedure: COLONOSCOPY WITH PROPOFOL;  Surgeon: [name], MD;  Location: [facility] ENDOSCOPY;  Service: Gastroenterology;  Laterality: N/A;  . COLONOSCOPY WITH PROPOFOL N/A 07/30/2018   Procedure: COLONOSCOPY WITH PROPOFOL;  Surgeon: [name], MD;  Location: [facility] ENDOSCOPY;  Service: Gastroenterology;  Laterality: N/A;  . COLONOSCOPY WITH PROPOFOL N/A 10/10/2018   Procedure: COLONOSCOPY WITH PROPOFOL;  Surgeon: Lucilla Lame, MD;  Location: [facility] ENDOSCOPY;  Service: Endoscopy;  Laterality: N/A;  . DIAGNOSTIC LAPAROSCOPY    . ESOPHAGOGASTRODUODENOSCOPY (EGD) WITH PROPOFOL N/A 12/20/2017   Procedure: ESOPHAGOGASTRODUODENOSCOPY (EGD) WITH PROPOFOL;  Surgeon: [name], MD;  Location: [place];  Service: Gastroenterology;  Laterality: N/A;  . ESOPHAGOGASTRODUODENOSCOPY (EGD)  WITH PROPOFOL  07/30/2018   Procedure: ESOPHAGOGASTRODUODENOSCOPY (EGD) WITH PROPOFOL;  Surgeon: [name], MD;  Location: [facility] ENDOSCOPY;  Service: Gastroenterology;;  . Otho Darner SIGMOIDOSCOPY N/A 11/21/2018   Procedure: FLEXIBLE SIGMOIDOSCOPY;  Surgeon: [name], MD;  Location: [facility] ENDOSCOPY;  Service: Gastroenterology;  Laterality: N/A;  . LAPAROTOMY N/A 08/31/2017   Procedure: EXPLORATORY LAPAROTOMY for SBO, ileocolectomy, removal of piece of uterine wall;  Surgeon: [name], MD;  Location: [facility] ORS;  Service: General;  Laterality: N/A;  . LASER ABLATION CONDOLAMATA N/A 02/22/2018   Procedure: LASER ABLATION/REMOVAL OF YCXKGYJEHUD AROUND ANUS AND VAGINA;  Surgeon: Michael Boston, MD;  Location: [place];  Service: General;  Laterality: N/A;  . OOPHORECTOMY    . PORTA CATH INSERTION N/A 05/13/2018   Procedure: PORTA CATH INSERTION;  Surgeon: Katha Cabal, MD;  Location: [place] CV LAB;  Service: Cardiovascular;  Laterality: N/A;  . SMALL INTESTINE SURGERY    . TANDEM RING INSERTION     x3  . THORACOTOMY    . TOTAL KNEE ARTHROPLASTY Left 04/24/2018   Procedure: TOTAL KNEE ARTHROPLASTY;  Surgeon: Lovell Sheehan, MD;  Location: [facility] ORS;  Service: Orthopedics;  Laterality: Left;    Prior to Admission medications   Medication Sig Start Date End Date Taking? Authorizing Provider  amitriptyline (ELAVIL) 75 MG tablet Take 1 tablet (75 mg total) by mouth at bedtime. 11/11/18 02/09/19  [name], MD  budesonide (ENTOCORT EC) 3 MG 24 hr capsule Take 3 capsules (9 mg total) by mouth daily. 10/12/18   Nicholes Mango, MD  Calcium Carb-Cholecalciferol (CALCIUM 500 +D) 500-400 MG-UNIT TABS Take 2 tablets by mouth daily.    [provider]  diphenoxylate-atropine (LOMOTIL) 2.5-0.025 MG tablet Take 2 tablets by mouth 4 (four) times daily. Patient not taking: Reported on 12/11/2018 11/26/18   Nicholes Mango, MD  famotidine (PEPCID) 20 MG  tablet Take 1 tablet (20 mg total) by mouth 2 (two) times daily as needed for heartburn or indigestion. 11/25/18   Gouru, Illene Silver, MD  feeding supplement, ENSURE ENLIVE, (ENSURE ENLIVE) LIQD Take 237 mLs by mouth 2 (two) times daily between meals. 11/27/18   Gouru, Illene Silver, MD  hydrocortisone (ANUSOL-HC) 2.5 % rectal cream Place 1 application rectally 2 (two) times daily for 10 days. 12/13/18 12/23/18  [name], MD  hydrocortisone (CORTEF) 10 MG tablet Take 1 tablet (10 mg total) by mouth 2 (two) times daily. 11/11/18 02/09/19  [name], MD  hyoscyamine (LEVBID) 0.375 MG 12 hr tablet Take 1 tablet (0.375 mg total) by mouth 2 (two) times daily. 10/21/18   [name], MD  Multiple Vitamins-Minerals (MULTIVITAMIN WITH MINERALS) tablet Take 1 tablet by mouth daily. 08/01/18 08/01/19  Loletha Grayer, MD  ondansetron (ZOFRAN) 4 MG tablet Take 1 tablet (4 mg total) by mouth every 6 (six) hours as needed for nausea. Patient not taking: Reported on 12/11/2018 11/25/18   Nicholes Mango, MD  ondansetron (ZOFRAN) 8 MG tablet Take 1 tablet (8 mg total) by mouth every 8 (eight) hours as needed for nausea or vomiting. Patient not taking: Reported on 11/28/2018 11/07/18   Karen Kitchens, NP  pantoprazole (PROTONIX) 40 MG tablet Take 1 tablet (40 mg total) by mouth daily. 10/30/18   Karen Kitchens, NP  potassium chloride SA (K-DUR,KLOR-CON) 20 MEQ tablet Take 1 tablet (20 mEq total) by mouth daily. for 4 days. 11/06/18   Lequita Asal, MD  rifaximin (XIFAXAN) 550 MG TABS tablet Take by mouth. 12/03/18 12/17/18  [provider]  rivaroxaban (XARELTO) 20 MG TABS tablet Take 20 mg by mouth daily with supper.    [provider]  traMADol (ULTRAM) 50 MG tablet Take 1 tablet (50 mg total) by mouth every 12 (twelve) hours as needed for up to 5 days for severe pain. 12/13/18 12/18/18  [name], MD  VENTOLIN HFA 108 [phone] Base) MCG/ACT inhaler Inhale 1-2 puffs into the lungs every 4 (four)  hours as needed for shortness of breath.     [provider]  VIBERZI 100 MG TABS TAKE 1 TABLET BY MOUTH TWICE DAILY WITH  A  MEAL 10/11/18   Verlon Au, NP    Allergies Ketamine  Family History  Problem Relation Age of Onset  . Hypertension Father   . Diabetes Father   . Alcohol abuse Daughter   . Hypertension Maternal Grandmother   . Diabetes Maternal Grandmother   . Diabetes Paternal Grandmother   . Hypertension Paternal Grandmother     Social History Social History   Tobacco Use  . Smoking status: Former Smoker    Last attempt to quit: 10/16/2006    Years since quitting:  12.1  . Smokeless tobacco: Never Used  Substance Use Topics  . Alcohol use: Not Currently    Frequency: Never    Comment: seldom  . Drug use: Yes    Types: Marijuana    Review of Systems  Constitutional: Negative for fever. Eyes: Negative for visual changes. ENT: Negative for sore throat. Neck: No neck pain  Cardiovascular: Negative for chest pain. Respiratory: Negative for shortness of breath. Gastrointestinal: + abdominal pain, vomiting and diarrhea. Genitourinary: Negative for dysuria. Musculoskeletal: Negative for back pain. Skin: Negative for rash. Neurological: Negative for headaches, weakness or numbness. Psych: No SI or HI  ____________________________________________   PHYSICAL EXAM:  VITAL SIGNS: ED Triage Vitals  Enc Vitals Group     BP 12/16/18 1052 (!) 142/96     Pulse Rate 12/16/18 1052 (!) 128     Resp 12/16/18 1052 20     Temp 12/16/18 1052 98.6 F (37 C)     Temp Source 12/16/18 1052 Oral     SpO2 12/16/18 1052 100 %     Weight 12/16/18 1054 168 lb (76.2 kg)     Height 12/16/18 1054 5' 8"  (1.727 m)     Head Circumference --      Peak Flow --      Pain Score 12/16/18 1054 10     Pain Loc --      Pain Edu? --      Excl. in [place]? --     Constitutional: Alert and oriented, in significant distress due to pain.  HEENT:      Head: Normocephalic and  atraumatic.         Eyes: Conjunctivae are normal. Sclera is non-icteric.       Mouth/Throat: Mucous membranes are dry.       Neck: Supple with no signs of meningismus. Cardiovascular: Tachycardic with regular rhythm. No murmurs, gallops, or rubs. 2+ symmetrical distal pulses are present in all extremities. No JVD. Respiratory: Normal respiratory effort. Lungs are clear to auscultation bilaterally. No wheezes, crackles, or rhonchi.  Gastrointestinal: Soft, diffuse tenderness to palpation, non distended with positive bowel sounds. No rebound or guarding. Musculoskeletal: Nontender with normal range of motion in all extremities. No edema, cyanosis, or erythema of extremities. Neurologic: Normal speech and language. Face is symmetric. Moving all extremities. No gross focal neurologic deficits are appreciated. Skin: Skin is warm, dry and intact. No rash noted. Psychiatric: Mood and affect are normal. Speech and behavior are normal.  ____________________________________________   LABS (all labs ordered are listed, but only abnormal results are displayed)  Labs Reviewed  COMPREHENSIVE METABOLIC PANEL - Abnormal; Notable for the following components:      Result Value   Sodium 132 (*)    Chloride 96 (*)    BUN 21 (*)    Total Protein 9.0 (*)    AST 92 (*)    ALT 170 (*)    Alkaline Phosphatase 333 (*)    Total Bilirubin 1.3 (*)    All other components within normal limits  CBC WITH DIFFERENTIAL/PLATELET - Abnormal; Notable for the following components:   MCV 103.1 (*)    MCH 34.7 (*)    Platelets 105 (*)    Lymphs Abs 0.6 (*)    Abs Immature Granulocytes 0.17 (*)    All other components within normal limits  LIPASE, BLOOD  URINALYSIS, COMPLETE (UACMP) WITH MICROSCOPIC   ____________________________________________  EKG  none  ____________________________________________  RADIOLOGY  I have personally reviewed the images performed during  this visit and I agree with the  Radiologist's read.   Interpretation by Radiologist:  Ct Angio Abd/pel W And/or Wo Contrast  Result Date: 12/16/2018 CLINICAL DATA:  53 year old with abdominal pain. Abdominal pain is worse with bowel movements. History of cervical cancer. EXAM: CT ANGIOGRAPHY ABDOMEN AND PELVIS WITH CONTRAST AND WITHOUT CONTRAST TECHNIQUE: Multidetector CT imaging of the abdomen and pelvis was performed using the standard protocol during bolus administration of intravenous contrast. Multiplanar reconstructed images and MIPs were obtained and reviewed to evaluate the vascular anatomy. CONTRAST:  193m OMNIPAQUE IOHEXOL 350 MG/ML SOLN COMPARISON:  CT abdomen pelvis 11/26/2018 FINDINGS: VASCULAR Aorta: Normal caliber of the abdominal aorta with atherosclerotic disease. Negative for an aortic dissection. Celiac: Patent without evidence of aneurysm, dissection, vasculitis or significant stenosis. Mild atherosclerotic disease. SMA: Patent without evidence of aneurysm, dissection, vasculitis or significant stenosis. Renals: Both renal arteries are patent without evidence of aneurysm, dissection, vasculitis, fibromuscular dysplasia or significant stenosis. IMA: IMA is patent. Inflow: Patent without evidence of aneurysm, dissection, vasculitis or significant stenosis. Proximal Outflow: Proximal femoral arteries are patent bilaterally. Veins: Portal venous system is patent. Normal appearance of the IVC and renal veins. Iliac veins are patent. Review of the MIP images confirms the above findings. NON-VASCULAR Lower chest: Again noted are innumerable pulmonary nodules compatible with metastatic disease. Many of the lesions have central cavitation. No pleural effusions. Hepatobiliary: Cholecystectomy. Chronic extrahepatic biliary dilatation measuring up to 1.4 cm and minimally changed. Main portal venous system is patent. Mild intrahepatic biliary dilatation. No suspicious liver lesions. Pancreas: Unremarkable. No pancreatic ductal  dilatation or surrounding inflammatory changes. Spleen: Normal in size without focal abnormality. Adrenals/Urinary Tract: Normal adrenal glands. Normal appearance of both kidneys without hydronephrosis. Difficult to exclude mild bladder wall thickening but minimally changed from the previous examination. Stomach/Bowel: Normal appearance of the stomach and duodenum. Right hemicolectomy. Mucosal enhancement and wall thickening involving the rectum and sigmoid colon. There is no significant small bowel wall thickening. Negative for a bowel obstruction. Lymphatic: No lymph node enlargement in the abdomen or pelvis. Reproductive: Stable appearance of the uterus and adnexal structures. Other: Stable stranding or edema in the pelvic region, including the presacral region. These findings are similar to the previous examination. Negative for free air. Musculoskeletal: Significant disc space narrowing and endplate disease at LW4-O9and L4-L5. Disc space narrowing at L1-L2. IMPRESSION: VASCULAR 1. Mild atherosclerotic disease the aorta and visceral arteries. No significant visceral artery stenosis. Negative for an aortic aneurysm. 2. Venous structures are unremarkable. NON-VASCULAR 1. Mucosal enhancement and wall thickening in the rectum and sigmoid colon. Findings are similar to the previous examination and probably represent chronic inflammation related to cervical cancer treatment. Stable edema or stranding in the pelvis. 2. Right hemicolectomy. 3. Innumerable pulmonary nodules compatible with metastatic disease. 4. Stable biliary dilatation, status post cholecystectomy. Electronically Signed   By: AMarkus DaftM.D.   On: 12/16/2018 14:48      ____________________________________________   PROCEDURES  Procedure(s) performed: None Procedures Critical Care performed:  None ____________________________________________   INITIAL IMPRESSION / ASSESSMENT AND PLAN / ED COURSE   53y.o. female with a history of  metastatic cervical cancer, SBO status post partial colectomy, uterine and renal infarct, factor V Leiden on Xarelto who presents for evaluation of acute on chronic abdominal pain, vomiting, and diarrhea.  Patient is chronically ill-appearing and in significant distress due to pain, she is tachycardic but remaining of her vital signs are within normal limits, her abdomen is soft with  no distention, diffuse tenderness throughout with no rebound or guarding.  Differential diagnosis is broad and includes pain associated with her cancer, mesenteric ischemia, solid organ infarct, colitis, SBO, UTI, pyelonephritis. Will give IV narcotics, zofran, and IV fluids for symptom relief.  Will check labs and CT abdomen pelvis.    _________________________ 3:17 PM on 12/16/2018 -----------------------------------------  CT showing known mucosal enhancement and wall thickening of the rectum and sigmoid colon which is consistent with chronic inflammation related to her cervical cancer treatment.  No other acute findings.  Labs showing mildly elevated LFTs.  Patient is status post cholecystectomy with no obvious liver lesions seen on CT.  Normal white count.  UA is pending.  After 4 mg of morphine and 1 mg of Dilaudid patient reports that her pain is within reasonable control.  Will switch to oral pain medication with Tylenol 1000 mg and oxycodone 10 mg.  I spoke with social worker daily who will place a consult to palliative care to help with pain management as an outpatient. Care transferred to Dr. Mariea Clonts   As part of my medical decision making, I reviewed the following data within the [facility] notes reviewed and incorporated, Labs reviewed , Old chart reviewed, Radiograph reviewed , A consult was requested and obtained from this/these consultant(s) SW and palliative care, Notes from prior ED visits and [place] Controlled Substance Database    Pertinent labs & imaging results that were available  during my care of the patient were reviewed by me and considered in my medical decision making (see chart for details).    ____________________________________________   FINAL CLINICAL IMPRESSION(S) / ED DIAGNOSES  Final diagnoses:  Proctitis, radiation  Cancer associated pain      NEW MEDICATIONS STARTED DURING THIS VISIT:  ED Discharge Orders    None       Note:  This document was prepared using Dragon voice recognition software and may include unintentional dictation errors.    Alfred Levins, [place], MD 12/16/18 [phone]

## 2018-12-16 NOTE — Discharge Instructions (Signed)
Return to the emergency department if you develop severe pain, lightheadedness or fainting, fever, inability to keep down fluids, or any other symptoms concerning to you.

## 2018-12-16 NOTE — ED Notes (Addendum)
Pt unhooked to use restroom and reconnected after using restroom

## 2018-12-18 ENCOUNTER — Inpatient Hospital Stay: Payer: Medicaid Other

## 2018-12-18 ENCOUNTER — Inpatient Hospital Stay: Payer: Medicaid Other | Attending: Urgent Care | Admitting: Urgent Care

## 2018-12-18 VITALS — BP 147/86 | HR 110 | Temp 98.4°F | Resp 18 | Wt 167.2 lb

## 2018-12-18 VITALS — BP 124/82 | HR 79 | Resp 18

## 2018-12-18 DIAGNOSIS — K6289 Other specified diseases of anus and rectum: Secondary | ICD-10-CM | POA: Insufficient documentation

## 2018-12-18 DIAGNOSIS — R1030 Lower abdominal pain, unspecified: Secondary | ICD-10-CM

## 2018-12-18 DIAGNOSIS — R000 Tachycardia, unspecified: Secondary | ICD-10-CM | POA: Insufficient documentation

## 2018-12-18 DIAGNOSIS — R918 Other nonspecific abnormal finding of lung field: Secondary | ICD-10-CM | POA: Diagnosis not present

## 2018-12-18 DIAGNOSIS — E876 Hypokalemia: Secondary | ICD-10-CM

## 2018-12-18 DIAGNOSIS — Z9221 Personal history of antineoplastic chemotherapy: Secondary | ICD-10-CM

## 2018-12-18 DIAGNOSIS — R197 Diarrhea, unspecified: Secondary | ICD-10-CM

## 2018-12-18 DIAGNOSIS — C538 Malignant neoplasm of overlapping sites of cervix uteri: Secondary | ICD-10-CM

## 2018-12-18 DIAGNOSIS — R110 Nausea: Secondary | ICD-10-CM

## 2018-12-18 DIAGNOSIS — C539 Malignant neoplasm of cervix uteri, unspecified: Secondary | ICD-10-CM | POA: Diagnosis not present

## 2018-12-18 DIAGNOSIS — K529 Noninfective gastroenteritis and colitis, unspecified: Secondary | ICD-10-CM

## 2018-12-18 DIAGNOSIS — C7800 Secondary malignant neoplasm of unspecified lung: Secondary | ICD-10-CM | POA: Diagnosis not present

## 2018-12-18 DIAGNOSIS — R531 Weakness: Secondary | ICD-10-CM

## 2018-12-18 DIAGNOSIS — Z7189 Other specified counseling: Secondary | ICD-10-CM

## 2018-12-18 DIAGNOSIS — Z923 Personal history of irradiation: Secondary | ICD-10-CM

## 2018-12-18 DIAGNOSIS — G893 Neoplasm related pain (acute) (chronic): Secondary | ICD-10-CM

## 2018-12-18 DIAGNOSIS — T451X5A Adverse effect of antineoplastic and immunosuppressive drugs, initial encounter: Secondary | ICD-10-CM

## 2018-12-18 LAB — BASIC METABOLIC PANEL
Anion gap: 11 (ref 5–15)
BUN: 14 mg/dL (ref 6–20)
CO2: 24 mmol/L (ref 22–32)
Calcium: 9.4 mg/dL (ref 8.9–10.3)
Chloride: 99 mmol/L (ref 98–111)
Creatinine, Ser: 0.74 mg/dL (ref 0.44–1.00)
GFR calc Af Amer: >60 mL/min (ref >60)
GFR calc non Af Amer: >60 mL/min (ref >60)
Glucose, Bld: 141 mg/dL — ABNORMAL HIGH (ref 70–99)
Potassium: 3.7 mmol/L (ref 3.5–5.1)
Sodium: 134 mmol/L — ABNORMAL LOW (ref 135–145)

## 2018-12-18 LAB — MAGNESIUM: Magnesium: 1.3 mg/dL — ABNORMAL LOW (ref 1.7–2.4)

## 2018-12-18 MED ORDER — SODIUM CHLORIDE 0.9 % IV SOLN
Freq: Once | INTRAVENOUS | Status: AC
  Administered 2018-12-18: 11:00:00 via INTRAVENOUS
  Filled 2018-12-18: qty 1000

## 2018-12-18 MED ORDER — HEPARIN SOD (PORK) LOCK FLUSH 100 UNIT/ML IV SOLN
500.0000 [IU] | Freq: Once | INTRAVENOUS | Status: AC
  Administered 2018-12-18: 500 [IU] via INTRAVENOUS

## 2018-12-18 MED ORDER — PROMETHAZINE HCL 25 MG PO TABS: 25.0000 mg | ORAL_TABLET | Freq: Three times a day (TID) | ORAL | 0 refills | Status: DC | PRN

## 2018-12-18 MED ORDER — SODIUM CHLORIDE 0.9% FLUSH
10.0000 mL | INTRAVENOUS | Status: DC | PRN
  Administered 2018-12-18: 10 mL via INTRAVENOUS
  Filled 2018-12-18: qty 10

## 2018-12-18 NOTE — Progress Notes (Signed)
Pt here for follow up. Denies any concerns at this time. States she needs refill on Phenergan.

## 2018-12-18 NOTE — Progress Notes (Signed)
[facility] day:  12/11/2018   Chief Complaint: Joanna Hall is a 53 y.o. female with stage IV cervical cancer who is seen for a 1 week assessment following week #6 carboplatin + paclitaxel.   HPI:  The patient was last seen in the medical oncology clinic on 12/11/2018.  At that time, patient had been on rifaximin x1 week.  She had not appreciated any improvement in her bowels.  Patient on several other interventions for her bowels as well.  She continued to have intermittent diarrhea.  Patient noted constipation on 12/09/2018 for which she used MiraLAX.  Energy level was "good".  Patient has plans to get dentures on 12/13/2018.  Exam revealed lower abdominal discomfort (minimal).  CBC 3800 ([place] 2600).  ALT 49 and ALP 166.  Total bilirubin normal.  Magnesium low at 1.5 mg/dL.  She received cycle #6 carboplatin + paclitaxel on 12/11/2018.  Additionally, she received 2 g of IV magnesium replacement.  Patient contacted the office on 12/11/2018 requesting Roxicodone 10 mg tablets for "hemorrhoid pain".  Spoke with attending oncologist Mike Gip, MD) and the decision was made to deny prescription request.  Patient contacted Dr. Marius Ditch (gastroenterology) who felt as if her rectal pain was multifactorial related to radiation proctitis or symptomatic hemorrhoids.  Anusol cream was recommended.  Prescription provided for tramadol 50 mg BID PRN (Disp #10).   Patient contacted gastroenterology on 01/03/2019 with complaints of continued diarrhea and significant abdominal pain associated with bowel movements.  Patient was advised to go to the ED for further evaluation. In efforts to avoid having to go to the ED, patient contacted the cancer center [facility] requesting IV fluids and pain medications.  Appointment was provided, however when patient arrived she was tearful, and therefore sent to the ED as originally planned.  UA was positive for moderate hemoglobin and 20 mg/dL  ketones; no nitrites or LE present.  Patient was seen in the ED on 12/16/2018 by Dr. Rudene Re.  Notes reviewed.  Patient with complaints of lower abdominal pain and significant rectal pain associated with her bowel movements. WBC 4500 ([place] 3600).  Sodium 132 mmol/L.  AST 92, ALT 170, ALP 333, and total bilirubin 1.3 mg/dL.  Lipase was normal.  CT imaging of the abdomen revealed mucosal wall thickening in the rectum and sigmoid colon similar to previous CT imaging studies.  There were innumerable pulmonary nodules compatible with metastatic disease.  Patient was treated with IV opioids (fentanyl 50 mcg, morphine 62m , and hydromorphone 1 mg).  Palliative medicine provider (Borders, NP) was consulted.  Recommendation was made to restart Roxicodone 10 mg q4h PRN pain.  Patient was discharged home in stable condition to follow-up with PMT (Borders, NP) in clinic on 12/20/2018 for reassessment.  In the interim, patient notes that she is feeling "better".  She continues to experience lower abdominal pain despite prescribed Roxicodone.  Patient continues to experience significant diarrhea despite prescribed interventions.  Since being in clinic today, patient notes that she has had 3 diarrhea episodes.  She has appreciated a small bit of BRBPR with wiping.  Patient denies the continued use of stool softeners and laxative medications.  Continues on prescribed rivaroxaban dose daily.  She describes her energy level as being "ok".  She states, "I feel wiped out from all of the pain and diarrhea that have had over the last few days". Patient denies that she has experienced any B symptoms. She denies any interval infections.  Patient complains of frequent nausea without vomiting.  Patient advises that she maintains an adequate appetite. She is eating well. Weight today is 167 lb 3.5 oz (75.8 kg), which compared to her last visit to the clinic, represents a 6 pound decrease.  Patient complains of pain, rated 8/10,  in the clinic today.  Past Medical History:  Diagnosis Date  . Abdominal pain 06/10/2018  . Abnormal cervical Papanicolaou smear 09/18/2017  . Anxiety   . Aortic atherosclerosis ([place])   . Arthritis    neck and knees  . 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 Blood transfusion without reported diagnosis   . Cervical cancer ([place]) 09/2016   mets lung  . Chronic anal fissure   . Chronic diarrhea   . Dyspnea   . Erosive gastropathy 09/18/2017  . Factor V Leiden mutation ([place])   . Fecal incontinence   . Genital warts   . GERD (gastroesophageal reflux disease)   . Heart murmur   . Hematochezia   . Hemorrhoids   . 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 Hepatitis, chronic ([place]) 05/05/2017  . History of cancer chemotherapy    completed 06/2017  . History of Clostridium difficile infection    while undergoing chemo.  Negative test 10/2017  . Ileocolic anastomotic leak   . 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 Intestinal infection due to Clostridium difficile 09/18/2017  . Macrocytic anemia with vitamin B12 deficiency   . Multiple gastric ulcers   . Nausea vomiting and diarrhea   . Pancolitis ([place]) 07/27/2018  . Perianal condylomata   . Pneumonia    History of  . Pulmonary nodules   . Rectal bleeding   . Small bowel obstruction ([place]) 08/2017  . Stiff neck    limited right turn  . Vitamin D deficiency     Past Surgical History:  Procedure Laterality Date  . CHOLECYSTECTOMY    . COLON SURGERY  08/2017   resection  . COLONOSCOPY WITH PROPOFOL N/A 12/20/2017   Procedure: COLONOSCOPY WITH PROPOFOL;  Surgeon: [name], MD;  Location: [facility] ENDOSCOPY;  Service: Gastroenterology;  Laterality: N/A;  . COLONOSCOPY WITH PROPOFOL N/A 07/30/2018   Procedure: COLONOSCOPY WITH PROPOFOL;  Surgeon: [name], MD;  Location: [facility] ENDOSCOPY;  Service: Gastroenterology;  Laterality: N/A;  . COLONOSCOPY WITH PROPOFOL  N/A 10/10/2018   Procedure: COLONOSCOPY WITH PROPOFOL;  Surgeon: Lucilla Lame, MD;  Location: [facility] ENDOSCOPY;  Service: Endoscopy;  Laterality: N/A;  . DIAGNOSTIC LAPAROSCOPY    . ESOPHAGOGASTRODUODENOSCOPY (EGD) WITH PROPOFOL N/A 12/20/2017   Procedure: ESOPHAGOGASTRODUODENOSCOPY (EGD) WITH PROPOFOL;  Surgeon: [name], MD;  Location: [place];  Service: Gastroenterology;  Laterality: N/A;  . ESOPHAGOGASTRODUODENOSCOPY (EGD) WITH PROPOFOL  07/30/2018   Procedure: ESOPHAGOGASTRODUODENOSCOPY (EGD) WITH PROPOFOL;  Surgeon: [name], MD;  Location: [facility] ENDOSCOPY;  Service: Gastroenterology;;  . Otho Darner SIGMOIDOSCOPY N/A 11/21/2018   Procedure: FLEXIBLE SIGMOIDOSCOPY;  Surgeon: [name], MD;  Location: [facility] ENDOSCOPY;  Service: Gastroenterology;  Laterality: N/A;  . LAPAROTOMY N/A 08/31/2017   Procedure: EXPLORATORY LAPAROTOMY for SBO, ileocolectomy, removal of piece of uterine wall;  Surgeon: [name], MD;  Location: [facility] ORS;  Service: General;  Laterality: N/A;  . LASER ABLATION CONDOLAMATA N/A 02/22/2018   Procedure: LASER ABLATION/REMOVAL OF [id number] AROUND ANUS AND VAGINA;  Surgeon: Michael Boston, MD;  Location: [place];  Service: General;  Laterality: N/A;  . OOPHORECTOMY    . PORTA CATH INSERTION N/A 05/13/2018   Procedure: PORTA CATH INSERTION;  Surgeon: Katha Cabal, MD;  Location: [place] CV LAB;  Service: Cardiovascular;  Laterality: N/A;  . SMALL INTESTINE SURGERY    . TANDEM RING INSERTION     x3  . THORACOTOMY    . TOTAL KNEE ARTHROPLASTY Left 04/24/2018   Procedure: TOTAL KNEE ARTHROPLASTY;  Surgeon: Lovell Sheehan, MD;  Location: [facility] ORS;  Service: Orthopedics;  Laterality: Left;    Family History  Problem Relation Age of Onset  . Hypertension Father   . Diabetes Father   . Alcohol abuse Daughter   . Hypertension Maternal Grandmother   . Diabetes Maternal Grandmother   . Diabetes Paternal  Grandmother   . Hypertension Paternal Grandmother     Social History:  reports that she quit smoking about 12 years ago. She has never used smokeless tobacco. She reports previous alcohol use. She reports current drug use. Drug: Marijuana.  The patient's 28 year old daughter died of alcoholism.  The patient is originally from [place].  She moved from [place] to [place]. She is accompanied by her sister, April, today.   Allergies:  Allergies  Allergen Reactions  . Ketamine Anxiety and Other (See Comments)    Syncope episode/confusion     Current Medications: Current Outpatient Medications  Medication Sig Dispense Refill  . amitriptyline (ELAVIL) 75 MG tablet Take 1 tablet (75 mg total) by mouth at bedtime. 90 tablet 1  . budesonide (ENTOCORT EC) 3 MG 24 hr capsule Take 3 capsules (9 mg total) by mouth daily. 30 capsule 0  . Calcium Carb-Cholecalciferol (CALCIUM 500 +D) 500-400 MG-UNIT TABS Take 2 tablets by mouth daily.    . diphenoxylate-atropine (LOMOTIL) 2.5-0.025 MG tablet Take 2 tablets by mouth 4 (four) times daily. (Patient taking differently: Take 4 tablets by mouth 4 (four) times daily. ) 30 tablet 0  . famotidine (PEPCID) 20 MG tablet Take 1 tablet (20 mg total) by mouth 2 (two) times daily as needed for heartburn or indigestion.    . feeding supplement, ENSURE ENLIVE, (ENSURE ENLIVE) LIQD Take 237 mLs by mouth 2 (two) times daily between meals. (Patient not taking: Reported on 12/19/2018) 237 mL 12  . hydrocortisone (ANUSOL-HC) 2.5 % rectal cream Place 1 application rectally 2 (two) times daily for 10 days. 28 g 0  . hydrocortisone (CORTEF) 10 MG tablet Take 1 tablet (10 mg total) by mouth 2 (two) times daily. 180 tablet 0  . hyoscyamine (LEVBID) 0.375 MG 12 hr tablet Take 1 tablet (0.375 mg total) by mouth 2 (two) times daily. 60 tablet 0  . Multiple Vitamins-Minerals (MULTIVITAMIN WITH MINERALS) tablet Take 1 tablet by mouth daily. 30 tablet 0  . ondansetron  (ZOFRAN) 4 MG tablet Take 1 tablet (4 mg total) by mouth every 6 (six) hours as needed for nausea. (Patient not taking: Reported on 12/19/2018) 20 tablet 0  . pantoprazole (PROTONIX) 40 MG tablet Take 1 tablet (40 mg total) by mouth daily. 90 tablet 0  . potassium chloride SA (K-DUR,KLOR-CON) 20 MEQ tablet Take 1 tablet (20 mEq total) by mouth daily. for 4 days. 30 tablet 0  . rivaroxaban (XARELTO) 20 MG TABS tablet Take 20 mg by mouth daily with supper.    . VENTOLIN HFA 108 (90 Base) MCG/ACT inhaler Inhale 1-2 puffs into the lungs every 4 (four) hours as needed for shortness of breath.  0  . VIBERZI 100 MG TABS TAKE 1 TABLET BY MOUTH TWICE DAILY WITH  A  MEAL 60 tablet 0  . ondansetron (ZOFRAN) 8 MG tablet Take 1 tablet (8 mg total) by mouth every 8 (eight) hours as needed for nausea or vomiting. 30 tablet 1  . Oxycodone HCl 10 MG TABS Take 1 tablet (10 mg total) by mouth every 8 (eight) hours as needed. 60 tablet 0  . promethazine (PHENERGAN) 25 MG tablet Take 1 tablet (25 mg total) by mouth every 8 (eight) hours as needed for nausea or vomiting. (Patient not taking: Reported on 12/19/2018) 30 tablet 0   No current facility-administered medications for this visit.    Facility-Administered Medications Ordered in Other Visits  Medication Dose Route Frequency Provider Last Rate Last Dose  . heparin lock flush 100 unit/mL  500 Units Intravenous Once Corcoran, Melissa C, MD      . sodium chloride 0.9 % 1,000 mL with potassium chloride 20 mEq infusion   Intravenous Once Karen Kitchens, NP         Review of Systems  Constitutional: Positive for malaise/fatigue and weight loss (down 6 pounds). Negative for diaphoresis and fever.       "I am feeling better".  Energy described as "okay".  HENT: Negative.   Eyes: Negative.   Respiratory: Positive for shortness of breath (Exertional). Negative for cough, hemoptysis and sputum production.   Cardiovascular: Negative for chest pain, palpitations, orthopnea,  leg swelling and PND.  Gastrointestinal: Positive for abdominal pain (lower), blood in stool (some BRBPR with wiping), diarrhea and nausea (controlled). Negative for constipation, melena and vomiting.  Genitourinary: Negative for dysuria, frequency, hematuria and urgency.       No vaginal bleeding  Musculoskeletal: Negative for back pain, falls, joint pain and myalgias.  Skin: Negative for itching and rash.  Neurological: Positive for weakness (gneralized). Negative for dizziness, tremors and headaches.  Endo/Heme/Allergies: Does not bruise/bleed easily.  Psychiatric/Behavioral: Negative for depression, memory loss and suicidal ideas. The patient is not nervous/anxious and does not have insomnia.   All other systems reviewed and are negative.  Performance status (ECOG): 1-2  Vital Signs BP (!) 147/86 (BP Location: Left Arm, Patient Position: Sitting)   Pulse (!) 110   Temp 98.4 F (36.9 C)   Resp 18   Wt 167 lb 3.5 oz (75.8 kg)   SpO2 100%   BMI 25.43 kg/m   Physical Exam  Constitutional: She is oriented to person, place, and time and well-developed, well-nourished, and in no distress.  HENT:  Head: Normocephalic and atraumatic.  Mouth/Throat: Oropharynx is clear and moist and mucous membranes are normal.  Eyes: Pupils are equal, round, and reactive to light. EOM are normal. No scleral icterus.  Neck: Normal range of motion. Neck supple. No tracheal deviation present. No thyromegaly present.  Cardiovascular: Regular rhythm, normal heart sounds and intact distal pulses. Tachycardia present. Exam reveals no gallop and no friction rub.  No murmur heard. Pulmonary/Chest: Effort normal and breath sounds normal. No respiratory distress. She has no wheezes. She has no rales.  Abdominal: Soft. Bowel sounds are normal. She exhibits no distension. There is abdominal tenderness in the right lower quadrant and left lower quadrant.  Musculoskeletal: Normal range of motion.        General: No  tenderness or edema.  Lymphadenopathy:    She has no cervical adenopathy.    She has no axillary adenopathy.       Right: No inguinal  and no supraclavicular adenopathy present.       Left: No inguinal and no supraclavicular adenopathy present.  Neurological: She is alert and oriented to person, place, and time.  Skin: Skin is warm and dry. No rash noted. No erythema.  Psychiatric: Mood, affect and judgment normal.  Nursing note and vitals reviewed.   Imaging studies: 06/2017:  PET scan revealed enlarging pulmonary nodules but no evidence of abdominal disease per patient report. 09/13/2017:  Abdomen and pelvic CT revealed a small infarct in the inferior pole of the LEFT kidney and RIGHT uterine infarct. 11/08/2017:  PET scan at the [place] [facility] revealed innumerable stable bilateral cavitary pulmonary nodules (up to 8 mm), some of which demonstrate low level metabolic activity.  There were post treatment changes in the pelvis without evidence of suspicious hypermetabolic activity. 12/03/2017:  Abdomen and pelvic CT revealed continued multiple pulmonary nodules in the lung bases concerning for metastatic disease.  There was mild wall thickening of rectosigmoid colon is noted.  There was a stable 1.8 x 1.6 cm mixed fat and soft tissue density noted in right lower quadrant which may represent fat necrosis.  There was a larger low density is noted involving uterine fundus.  12/03/2017:  Pelvic MRI was unremarkable.   04/15/2018:  Chest, abdomen, and pelvis CT revealed innumerable (> 100) cavitary nodules scattered in the lungs, moderately enlarging compared to the 11/08/2017 PET-CT, suspicious for metastatic disease.  One index node in the RLL measures 1.0 x 1.1 cm (previously 0.6 x 0.6 cm).  There were no new nodules.  There was an ill-defined wall thickening in the rectosigmoid with surrounding stranding along fascia planes, probably sequela from prior radiation therapy.  There  was multilevel lumbar impingement due to spondylosis and degenerative disc disease.  There was heterogeneous enhancement in the uterus (some possibly from prior radiation therapy). 04/23/2018:  PET scan revealed numerous scattered solid and cavitary nodules in the lungs stable increased in size compared to the prior PET-CT from 11/08/2017. Largest nodule was 1.1 cm in the LUL (SUV 1.9).  These demonstrated low-grade metabolic activity up to a maximum SUV of about 2.3, increased from 11/08/2017.  05/23/2018:  Abdomen and pelvic CT revealed colonic wall thickening, consistent with colitis. Considerations include inflammatory or infectious causes. Ischemic causes would be less likely. She is s/p RIGHT hemicolectomy. She had a patent ileocolic anastomosis.  She received a 10 day course of ciprofloxacin and Flagyl.   09/07/2018:  Abdomen and pelvic CT angiogram revealed colitis, mainly affecting lower loops, question radiation colitis if there has been radiotherapy of the patient's cervical cancer.  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  09/30/2018:  Chest CT revealed interval decrease in size and number of metastatic pulmonary nodules.  There were no new/progressive findings in the chest or upper abdomen 12/16/2018: Abdominal and pelvic CT angiogram revealed mucosal wall thickening in the rectum and sigmoid colon similar to previous CT imaging studies.  There were innumerable pulmonary nodules compatible with metastatic disease.   Admission on 12/16/2018, Discharged on 12/16/2018  Component Date Value Ref Range Status  . Lipase 12/16/2018 21  11 - 51 U/L Final   Performed at [facility], [address] [place], [place] [address]  . Sodium 12/16/2018 132* 135 - 145 mmol/L Final  . Potassium 12/16/2018 3.6  3.5 - 5.1 mmol/L Final  . Chloride 12/16/2018 96* 98 - 111 mmol/L Final  . CO2 12/16/2018 23  22 - 32 mmol/L Final  . Glucose,  Bld 12/16/2018 98  70 - 99 mg/dL Final  . BUN 12/16/2018 21* 6  - 20 mg/dL Final  . Creatinine, Ser 12/16/2018 0.72  0.44 - 1.00 mg/dL Final  . Calcium 12/16/2018 9.5  8.9 - 10.3 mg/dL Final  . Total Protein 12/16/2018 9.0* 6.5 - 8.1 g/dL Final  . Albumin 12/16/2018 4.3  3.5 - 5.0 g/dL Final  . AST 12/16/2018 92* 15 - 41 U/L Final  . ALT 12/16/2018 170* 0 - 44 U/L Final  . Alkaline Phosphatase 12/16/2018 333* 38 - 126 U/L Final  . Total Bilirubin 12/16/2018 1.3* 0.3 - 1.2 mg/dL Final  . GFR calc non Af Amer 12/16/2018 >60  >60 mL/min Final  . GFR calc Af Amer 12/16/2018 >60  >60 mL/min Final  . Anion gap 12/16/2018 13  5 - 15 Final   Performed at [facility], [address] [place], [place] [address]  . Color, Urine 12/16/2018 YELLOW* YELLOW Final  . APPearance 12/16/2018 CLEAR* CLEAR Final  . Specific Gravity, Urine 12/16/2018 >1.046* 1.005 - 1.030 Final  . pH 12/16/2018 6.0  5.0 - 8.0 Final  . Glucose, UA 12/16/2018 NEGATIVE  NEGATIVE mg/dL Final  . Hgb urine dipstick 12/16/2018 MODERATE* NEGATIVE Final  . Bilirubin Urine 12/16/2018 NEGATIVE  NEGATIVE Final  . Ketones, ur 12/16/2018 20* NEGATIVE mg/dL Final  . Protein, ur 12/16/2018 NEGATIVE  NEGATIVE mg/dL Final  . Nitrite 12/16/2018 NEGATIVE  NEGATIVE Final  . Leukocytes,Ua 12/16/2018 NEGATIVE  NEGATIVE Final  . RBC / HPF 12/16/2018 11-20  0 - 5 RBC/hpf Final  . WBC, UA 12/16/2018 6-10  0 - 5 WBC/hpf Final  . Bacteria, UA 12/16/2018 NONE SEEN  NONE SEEN Final  . Squamous Epithelial / LPF 12/16/2018 0-5  0 - 5 Final  . Mucus 12/16/2018 PRESENT   Final   Performed at [facility], [address], [place], [place] [address]  . WBC 12/16/2018 4.5  4.0 - 10.5 K/uL Final  . RBC 12/16/2018 3.92  3.87 - 5.11 MIL/uL Final  . Hemoglobin 12/16/2018 13.6  12.0 - 15.0 g/dL Final  . HCT 12/16/2018 40.4  36.0 - 46.0 % Final  . MCV 12/16/2018 103.1* 80.0 - 100.0 fL Final  . MCH 12/16/2018 34.7* 26.0 - 34.0 pg Final  . MCHC 12/16/2018 33.7  30.0 - 36.0 g/dL Final  . RDW 12/16/2018  12.1  11.5 - 15.5 % Final  . Platelets 12/16/2018 105* 150 - 400 K/uL Final   Comment: Immature Platelet Fraction may be clinically indicated, consider ordering this additional test [id number]   . nRBC 12/16/2018 0.0  0.0 - 0.2 % Final  . Neutrophils Relative % 12/16/2018 79  % Final  . Neutro Abs 12/16/2018 3.6  1.7 - 7.7 K/uL Final  . Lymphocytes Relative 12/16/2018 13  % Final  . Lymphs Abs 12/16/2018 0.6* 0.7 - 4.0 K/uL Final  . Monocytes Relative 12/16/2018 3  % Final  . Monocytes Absolute 12/16/2018 0.1  0.1 - 1.0 K/uL Final  . Eosinophils Relative 12/16/2018 0  % Final  . Eosinophils Absolute 12/16/2018 0.0  0.0 - 0.5 K/uL Final  . Basophils Relative 12/16/2018 1  % Final  . Basophils Absolute 12/16/2018 0.1  0.0 - 0.1 K/uL Final  . Smear Review 12/16/2018 PLATELET CLUMPS NOTED ON SMEAR, UNABLE TO ESTIMATE   Final  . Immature Granulocytes 12/16/2018 4  % Final  . Abs Immature Granulocytes 12/16/2018 0.17* 0.00 - 0.07 K/uL Final   Performed at [facility], [address], [place], [place] [address]    Assessment:  AFTIN LYE is a 53 y.o. female with a history of stage IB1 cervical cancer s/p concurrent cisplatin and radiation (external beam and brachytherapy) from 11/2016 - 02/2017.    She was treated by Dr. Christene Slates at [facility] in [place], [place].  In 01/2017, she received cisplatin x 2 and carboplatin x 1 (01/29/2017) due to ARF and XRT. Radiation was followed by T & O on 02/01/2017 and T & N on 02/10/2017 and 02/20/2017.  Course was complicated by weight loss (80 pounds), nausea, vomiting, electrolyte wasting (potassium and magnesium), and multiple hospitalizations.  PET scan in 06/2017 revealed enlarging pulmonary nodules but no evidence of abdominal disease per patient report.  She is s/p right ileocolectomy on 08/31/2017 for a small bowel obstruction.  She presented with abdominal pain.  Abdomen and pelvic CT on  09/13/2017 revealed a small infarct in the inferior pole of the LEFT kidney and RIGHT uterine infarct.   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 PDL-1 testing reveals a CPS score of 15.  Chest, abdomen, and pelvis CT on 04/15/2018 revealed innumerable (> 100) cavitary nodules scattered in the lungs, moderately enlarging compared to the 11/08/2017 PET-CT, suspicious for metastatic disease. One index node in the RLL measures 1.0 x 1.1 cm.  There were no new nodules.  There was an ill-defined wall thickening in the rectosigmoid with surrounding stranding along fascia planes, probably sequela from prior radiation therapy.  There was multilevel lumbar impingement due to spondylosis and degenerative disc disease.  There was heterogeneous enhancement in the uterus (some possibly from prior radiation therapy).  PET scan on 04/23/2018 revealed numerous scattered solid and cavitary nodules in the lungs stable increased in size compared to the prior PET-CT from 11/08/2017. Largest nodule was 1.1 cm in the LUL (SUV 1.9).  These demonstrated low-grade metabolic activity up to a maximum SUV of about 2.3, increased from 11/08/2017.   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  She is s/p cycle # 5 cycle of carboplatin and Taxol (06/04/2018 - 09/30/2018) with Margarette Canada support.    Chest CT on 09/30/2018 revealed interval decrease in size and number of metastatic pulmonary nodules.  There were no new/progressive findings in the chest or upper abdomen  Hypercoagulable work-up on 09/14/2017 revealed heterozygosity for Factor V Leiden (single [id number] mutation).  Testing was negative for prothrombin gene mutation, lupus anticoagulant panel, anticardiolipin antibodies, and beta-2 glycoprotein antibodies.  She was discharged on Xarelto.  She was [facility] to [facility] from 12/03/2017 - 12/08/2017 with abdominal pain.  She was not felt to have a uterine infarct.  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  The etiology was unclear.  She was admitted to [facility] from 10/08/2018 - 10/11/2018 for rectal bleeding. Hemoglobin dropped from 13 to 8.9 in 4 days. Platelets had decreased to 81,000; chronic anticoagulation (Xarelto) was held. She received 2 units of PRBCs. Repeat stool studies were negative for diarrhea of infectious etiology. Colonoscopy on 10/10/2018 revealed 3 ulcers in the terminal ileum (no evidence of dysplasia or malignancy). Throughout the course of her admission, platelet count was monitored (dropped as low as 61,000).   She was admitted to [facility] [place] from 10/17/2018 - 10/18/2018 at [facility] abdominal pain, nausea and vomiting.  Abdomen and pelvis CT suggested a partial small bowel obstruction.  She was seen by general surgery and had no  evidence of bowel obstruction clinically as she had a bowel movement was passing flatus and had a benign abdomen.  She is hepatitis C positive.   Hepatitis C genotype is 2a/2c.  She has B12 deficiency.  B12 was 165 on 11/27/2017.  She began B12 injections on 11/29/2017.  B12 injections occur monthly at home.  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  She has right knee pain and wishes to have surgery when feasible.  She was diagnosed with colitis.  Abdomen and pelvic CT on 05/23/2018 revealed colonic wall thickening, consistent with colitis. Considerations include inflammatory or infectious causes. Ischemic causes would be less likely. She is s/p RIGHT hemicolectomy. She had a patent ileocolic anastomosis.  She received a 10 day course of ciprofloxacin and Flagyl.  Abdomen and pelvic CT angiogram on 09/07/2018 revealed colitis, mainly affecting lower loops, question radiation colitis if there has been radiotherapy of the patient's cervical cancer.  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 Abdominal and pelvic CT angiogram on 12/16/2018 revealed mucosal wall  thickening in the rectum and sigmoid colon similar to previous CT imaging studies.  There were innumerable pulmonary nodules compatible with metastatic disease.  She has  chronic diarrhea s/p right hemicolectomy.  She began sandostatin 20 mg IM q month on 10/22/2018.  She began Levbid on 10/23/2018.   EGDon10/15/2019revealed an erythematous mucosa in the cardia, bilious gastric fluid, and LA grade A reflux esophagitis.  She has adrenal insufficiency and is on hydrocortisone.  She underwent full dental extraction on 11/05/2018.  Symptomatically, patient notes that she feels "better".  Energy has improved some.  She continues to have daily diarrhea despite prescribed interventions.  She denies the use of stool softeners or laxative medications at this point.  Patient complains of lower abdominal pain, as well as pain associated with her bowel movements.  Pain persists despite prescribed Roxicodone 10 mg every 4 hours PRN.  Exam reveals continued lower abdominal discomfort; no rebound or guarding.  Sodium 134 mmol/dL.  Magnesium low at 1.3 mg/dL.   Plan: 1. Labs today: BMP, Mg. 2. Metastatic cervical cancer  Tolerated reinitiation (cycle #6) carboplatin + paclitaxel fairly well in the setting of a fragile GI system.   Review CT of abdomen from 12/16/2018. Imaging personally reviewed and felt to be consistent with the dictated radiology report. Imaging reviewed with patient.   Mucosal wall thickening in the rectum and sigmoid colon similar to previous CT imaging studies.    Innumerable pulmonary nodules compatible with metastatic disease.  Discuss initial plans for 2-3 cycles of carboplatin + paclitaxel prior to reimaging.   In light of recent CT, may elect to extend interval based on clinical condition.   Patient has antiemetics at home to use on a PRN basis - continue as prescribed.   Refill Phenergan 25 mg TID PRN (Disp #30) per request.  3. Abdominal and rectal pain  Pain continues  to be significant in her lower abdomen and rectum despite prescribed Roxicodone 10 mg every 4 hours PRN.  Associated with scar tissue, radiation-induced proctitis, and potentially symptomatic hemorrhoids.  Followed by PMT (Borders, NP) at this point for pain management.   Next appointment scheduled for 12/20/2018. 4. Diarrhea  Continues despite multiple interventions.   Followed by GI both locally and at [facility].   Denies continued use of stool softeners and laxatives - even with constipation she was encouraged to avoid.   Encouraged to utilize ORS to prevent dehydration and electrolyte derangements associated with frequent stooling. 5. Tachycardia  Multifactorial related to pain, poor intake, and frequent stools.   Will give 1L 0.9% NS today in clinic.  6. Hypomagnesemia  Discuss low magnesium. Magnesium 1.3 mg/dL.   Requires frequent supplementation.  Discuss increasing supplementation to 6 grams intravenous magnesium today - agreed.  7. Goals of care  Unsure of desire to continue with treatment.   PMT has helped in the interim - appreciate consult and patient encouragement.   Wishes for pain and diarrhea to come under better control.   Due to frequent trips to the ED, [facility], and inpatient units, patient advises that it is not a feasible option for her to continue to be seen in [place] location.   States, "When I am feeling bad, I have to drive myself all the way down here. I would rather be closer to the hospital and the help that I need"  Asking to transfer care to Dr. Charlaine Dalton for ongoing care and management of her cervical cancer diagnosis.  8. RTC on 12/20/2018 in [place] to meet with PMT provider (Borders, NP) as already scheduled.  9. RTC in 1 week for labs (BMP, magnesium) and +/- additional IV Mg.  10. RTC on 01/01/2019 for MD assessment Rogue Bussing, MD), labs (CBC with differential, CMP, Mg), IVFs with +/- Mg, and cycle #7 carboplatin + paclitaxel.    Honor Loh, NP  12/18/18, 4:11 PM

## 2018-12-18 NOTE — Patient Instructions (Signed)
Hypomagnesemia Hypomagnesemia is a condition in which the level of magnesium in the blood is low. Magnesium is a mineral that is found in many foods. It is used in many different processes in the body. Hypomagnesemia can affect every organ in the body. In severe cases, it can cause life-threatening problems. What are the causes? This condition may be caused by:  Not getting enough magnesium in your diet.  Malnutrition.  Problems with absorbing magnesium from the intestines.  Dehydration.  Alcohol abuse.  Vomiting.  Severe or chronic diarrhea.  Some medicines, including medicines that make you urinate more (diuretics).  Certain diseases, such as kidney disease, diabetes, celiac disease, and overactive thyroid. What are the signs or symptoms? Symptoms of this condition include:  Loss of appetite.  Nausea and vomiting.  Involuntary shaking or trembling of a body part (tremor).  Muscle weakness.  Tingling in the arms and legs.  Sudden tightening of muscles (muscle spasms).  Confusion.  Psychiatric issues, such as depression, irritability, or psychosis.  A feeling of fluttering of the heart.  Seizures. These symptoms are more severe if magnesium levels drop suddenly. How is this diagnosed? This condition may be diagnosed based on:  Your symptoms and medical history.  A physical exam.  Blood and urine tests. How is this treated? Treatment depends on the cause and the severity of the condition. It may be treated with:  A magnesium supplement. This can be taken in pill form. If the condition is severe, magnesium is usually given through an IV.  Changes to your diet. You may be directed to eat foods that have a lot of magnesium, such as green leafy vegetables, peas, beans, and nuts.  Stopping any intake of alcohol. Follow these instructions at home:      Make sure that your diet includes foods with magnesium. Foods that have a lot of magnesium in them  include: ? Green leafy vegetables, such as spinach and broccoli. ? Beans and peas. ? Nuts and seeds, such as almonds and sunflower seeds. ? Whole grains, such as whole grain bread and fortified cereals.  Take magnesium supplements if your health care provider tells you to do that. Take them as directed.  Take over-the-counter and prescription medicines only as told by your health care provider.  Have your magnesium levels monitored as told by your health care provider.  When you are active, drink fluids that contain electrolytes.  Avoid drinking alcohol.  Keep all follow-up visits as told by your health care provider. This is important. Contact a health care provider if:  You get worse instead of better.  Your symptoms return. Get help right away if you:  Develop severe muscle weakness.  Have trouble breathing.  Feel that your heart is racing. Summary  Hypomagnesemia is a condition in which the level of magnesium in the blood is low.  Hypomagnesemia can affect every organ in the body.  Treatment may include eating more foods that contain magnesium, taking magnesium supplements, and not drinking alcohol.  Have your magnesium levels monitored as told by your health care provider. This information is not intended to replace advice given to you by your health care provider. Make sure you discuss any questions you have with your health care provider. Document Released: 06/28/2005 Document Revised: 09/03/2017 Document Reviewed: 09/03/2017 Elsevier Interactive Patient Education  2019 [employer].

## 2018-12-19 ENCOUNTER — Other Ambulatory Visit: Payer: Self-pay

## 2018-12-19 ENCOUNTER — Inpatient Hospital Stay: Payer: Medicaid Other | Attending: Hospice and Palliative Medicine | Admitting: Hospice and Palliative Medicine

## 2018-12-19 VITALS — BP 118/80 | HR 84 | Temp 97.2°F | Resp 18 | Wt 173.1 lb

## 2018-12-19 DIAGNOSIS — Z7952 Long term (current) use of systemic steroids: Secondary | ICD-10-CM | POA: Insufficient documentation

## 2018-12-19 DIAGNOSIS — C7800 Secondary malignant neoplasm of unspecified lung: Secondary | ICD-10-CM | POA: Insufficient documentation

## 2018-12-19 DIAGNOSIS — Z79899 Other long term (current) drug therapy: Secondary | ICD-10-CM | POA: Diagnosis not present

## 2018-12-19 DIAGNOSIS — Z86718 Personal history of other venous thrombosis and embolism: Secondary | ICD-10-CM | POA: Diagnosis not present

## 2018-12-19 DIAGNOSIS — Z5189 Encounter for other specified aftercare: Secondary | ICD-10-CM | POA: Diagnosis not present

## 2018-12-19 DIAGNOSIS — Z87891 Personal history of nicotine dependence: Secondary | ICD-10-CM | POA: Insufficient documentation

## 2018-12-19 DIAGNOSIS — C539 Malignant neoplasm of cervix uteri, unspecified: Secondary | ICD-10-CM

## 2018-12-19 DIAGNOSIS — D6851 Activated protein C resistance: Secondary | ICD-10-CM | POA: Diagnosis not present

## 2018-12-19 DIAGNOSIS — G893 Neoplasm related pain (acute) (chronic): Secondary | ICD-10-CM | POA: Diagnosis not present

## 2018-12-19 DIAGNOSIS — R112 Nausea with vomiting, unspecified: Secondary | ICD-10-CM | POA: Diagnosis not present

## 2018-12-19 DIAGNOSIS — Z923 Personal history of irradiation: Secondary | ICD-10-CM | POA: Diagnosis not present

## 2018-12-19 DIAGNOSIS — Z7189 Other specified counseling: Secondary | ICD-10-CM | POA: Diagnosis not present

## 2018-12-19 DIAGNOSIS — Z86711 Personal history of pulmonary embolism: Secondary | ICD-10-CM | POA: Diagnosis not present

## 2018-12-19 DIAGNOSIS — Z515 Encounter for palliative care: Secondary | ICD-10-CM | POA: Diagnosis not present

## 2018-12-19 DIAGNOSIS — C538 Malignant neoplasm of overlapping sites of cervix uteri: Secondary | ICD-10-CM | POA: Insufficient documentation

## 2018-12-19 DIAGNOSIS — B182 Chronic viral hepatitis C: Secondary | ICD-10-CM | POA: Diagnosis not present

## 2018-12-19 DIAGNOSIS — Z5112 Encounter for antineoplastic immunotherapy: Secondary | ICD-10-CM | POA: Insufficient documentation

## 2018-12-19 DIAGNOSIS — Z7901 Long term (current) use of anticoagulants: Secondary | ICD-10-CM | POA: Diagnosis not present

## 2018-12-19 DIAGNOSIS — Z5111 Encounter for antineoplastic chemotherapy: Secondary | ICD-10-CM | POA: Diagnosis present

## 2018-12-19 MED ORDER — OXYCODONE HCL 10 MG PO TABS: 10.0000 mg | ORAL_TABLET | Freq: Three times a day (TID) | ORAL | 0 refills | Status: DC | PRN

## 2018-12-19 NOTE — Progress Notes (Signed)
Here for follow up w [name] borders.

## 2018-12-19 NOTE — Progress Notes (Signed)
[place]  Telephone:([phone] [phone]   Name: Joanna Hall Date: 12/19/2018 MRN: [id number]  DOB: 29-Sep-1966  Patient Care Team: Arnetha Courser, MD as PCP - General (Family Medicine) Mellody Drown, MD as Consulting Physician (Obstetrics and Gynecology) Lenor Coffin, MD as Attending Physician (Gynecology) Lequita Asal, MD as Medical Oncologist (Hematology and Oncology) [name], MD as Consulting Physician (Gastroenterology) Michael Boston, MD as Consulting Physician (General Surgery) Lovell Sheehan, MD as Consulting Physician (Orthopedic Surgery) Clent Jacks, RN as Registered Nurse    REASON FOR CONSULTATION: Palliative Care consult requested for this 53 y.o. 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  PMH also notable for history of right hemicolectomy with chronic inflammatory changes to the colon.  Patient is followed by GI.  She was recently hospitalized 11/19/2020 11/25/2018 with GI bleed.  Patient underwent flex sig on 2/6 but was mostly unremarkable except edematous mucosa.  Patient has chronic abdominal pain.  She presented to the ER on 12/16/2018 with acute on chronic intractable abdominal pain.  Patient had CT angio of the abdomen/pelvis, which revealed mucosal enhancement and wall thickening in the rectum and sigmoid colon but was essentially unchanged from previous studies.  Palliative care was consulted to help address symptoms and goals.  SOCIAL HISTORY:    Patient is not married.  She lives at home with her mother and father.  She has an adult daughter who lives in [place], [place].  She had another daughter who is now deceased.  ADVANCE DIRECTIVES:  Not on file   CODE STATUS:   PAST MEDICAL HISTORY: Past Medical History:  Diagnosis Date  . Abdominal pain  06/10/2018  . Abnormal cervical Papanicolaou smear 09/18/2017  . Anxiety   . Aortic atherosclerosis ([place])   . Arthritis    neck and knees  . 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 Blood transfusion without reported diagnosis   . Cervical cancer ([place]) 09/2016   mets lung  . Chronic anal fissure   . Chronic diarrhea   . Dyspnea   . Erosive gastropathy 09/18/2017  . Factor V Leiden mutation ([place])   . Fecal incontinence   . Genital warts   . GERD (gastroesophageal reflux disease)   . Heart murmur   . Hematochezia   . Hemorrhoids   . 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 Hepatitis, chronic ([place]) 05/05/2017  . History of cancer chemotherapy    completed 06/2017  . History of Clostridium difficile infection    while undergoing chemo.  Negative test 10/2017  . Ileocolic anastomotic leak   . 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 Intestinal infection due to Clostridium difficile 09/18/2017  . Macrocytic anemia with vitamin B12 deficiency   . Multiple gastric ulcers   . Nausea vomiting and diarrhea   . Pancolitis ([place]) 07/27/2018  . Perianal condylomata   . Pneumonia    History of  . Pulmonary nodules   . Rectal bleeding   . Small bowel obstruction ([place]) 08/2017  . Stiff neck    limited right turn  . Vitamin D deficiency     PAST SURGICAL HISTORY:  Past Surgical History:  Procedure Laterality Date  . CHOLECYSTECTOMY    . COLON SURGERY  08/2017   resection  . COLONOSCOPY WITH PROPOFOL N/A 12/20/2017   Procedure: COLONOSCOPY WITH PROPOFOL;  Surgeon: [name], MD;  Location: [facility] ENDOSCOPY;  Service: Gastroenterology;  Laterality: N/A;  . COLONOSCOPY WITH PROPOFOL N/A 07/30/2018   Procedure: COLONOSCOPY WITH PROPOFOL;  Surgeon: [name], MD;  Location[facility] ENDOSCOPY;  Service: Gastroenterology;  Laterality: N/A;  . COLONOSCOPY WITH PROPOFOL N/A 10/10/2018   Procedure: COLONOSCOPY WITH PROPOFOL;   Surgeon: Lucilla Lame, MD;  Location: [facility] ENDOSCOPY;  Service: Endoscopy;  Laterality: N/A;  . DIAGNOSTIC LAPAROSCOPY    . ESOPHAGOGASTRODUODENOSCOPY (EGD) WITH PROPOFOL N/A 12/20/2017   Procedure: ESOPHAGOGASTRODUODENOSCOPY (EGD) WITH PROPOFOL;  Surgeon: [name], MD;  Location: [place];  Service: Gastroenterology;  Laterality: N/A;  . ESOPHAGOGASTRODUODENOSCOPY (EGD) WITH PROPOFOL  07/30/2018   Procedure: ESOPHAGOGASTRODUODENOSCOPY (EGD) WITH PROPOFOL;  Surgeon: [name], MD;  Location: [facility] ENDOSCOPY;  Service: Gastroenterology;;  . Otho Darner SIGMOIDOSCOPY N/A 11/21/2018   Procedure: FLEXIBLE SIGMOIDOSCOPY;  Surgeon: [name], MD;  Location: [facility] ENDOSCOPY;  Service: Gastroenterology;  Laterality: N/A;  . LAPAROTOMY N/A 08/31/2017   Procedure: EXPLORATORY LAPAROTOMY for SBO, ileocolectomy, removal of piece of uterine wall;  Surgeon: [name], MD;  Location: [facility] ORS;  Service: General;  Laterality: N/A;  . LASER ABLATION CONDOLAMATA N/A 02/22/2018   Procedure: LASER ABLATION/REMOVAL OF ZOXWRUEAVWU AROUND ANUS AND VAGINA;  Surgeon: Michael Boston, MD;  Location: [place];  Service: General;  Laterality: N/A;  . OOPHORECTOMY    . PORTA CATH INSERTION N/A 05/13/2018   Procedure: PORTA CATH INSERTION;  Surgeon: Katha Cabal, MD;  Location: [facility];  Service: Cardiovascular;  Laterality: N/A;  . SMALL INTESTINE SURGERY    . TANDEM RING INSERTION     x3  . THORACOTOMY    . TOTAL KNEE ARTHROPLASTY Left 04/24/2018   Procedure: TOTAL KNEE ARTHROPLASTY;  Surgeon: Lovell Sheehan, MD;  Location: [facility] ORS;  Service: Orthopedics;  Laterality: Left;    HEMATOLOGY/ONCOLOGY HISTORY:  Oncology History   Patient was diagnosed with cervical adenocarcinoma in [place] in 09/2016.  She has had a long-standing history of abnormal Pap smears.  She presented with an ovarian cyst.  Laparoscopic surgery was pursued but was difficult  due to scar tissue.  Had BSO and rupture of cyst with purulent drainage into the abdomen.  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  Radical hysterectomy was aborted.  She was treated by Dr. Christene Slates at [facility] in [place], [place].  Decision was made to pursue concurrent chemotherapy (weekly cisplatin) and radiation.  She received treatment from 11/2016-05/2017.  01/2017 cisplatin x 2 and carboplatin x 1 (01/29/2017) due to ARF and XRT.  XRT was followed by T & O on 02/01/2017 and T & N 02/10/2017 and 02/20/2017.  Course was complicated by 80 pound weight loss, nausea, vomiting, electrolyte wasting (potassium and magnesium).  She describes that.  Is been sick constantly requiring at least 20 hospitalizations.  Follow-up CT chest and PET on 06/2017. Per patient, 'radiation worked' and no disease in the abdomen.  At that time she was noted to have lung nodules that were growing and follow-up imaging was scheduled for 10/2017.  She was admitted to hospital in [place] for small bowel obstruction which was managed conservatively and she was home for a week prior to traveling to [place] for Thanksgiving holiday where she has family.  She presented to ER in [place] on  08/2017 with nausea, vomiting, and lower abdominal pain.  Symptoms did not respond to conservative treatment.  CT on 08/26/2017 revealed small bowel obstruction with transition in the pelvis just superior to the uterus rather was a long segment of distal ileum with fatty wall thickening compatible with chronic inflammation and/or radiation enteritis. Imaging showed numerous pulmonary nodules consistent with metastatic disease. She underwent laparotomy and right ileocolectomy on 08/31/2017 at [facility].  Surgical findings revealed a thickened, matted, and scarred piece of distal small bowel close to the ileocecal valve.  She was discharged on 09/05/2017.  Pain markedly increased in intensity and  imaging was performed on 09/11/2017 which revealed: Debris within the anterior abdominal wall incision concerning for infection versus packing material, s/p post ileo-colectomy with expected postoperative changes, mild colonic ileus, numerous pulmonary nodules highly concerning for metastatic disease, punctate nonobstructing nephrolithiasis.  Staples were removed and one was packed.  She was started on doxycycline.  Abdominal and pelvic CT without contrast on 09/11/2017 revealed debris within anterior abdominal wall incision concerning for infection, versus packing material.She is s/pileocolectomy with expected postoperative changes and mild colonic ileus. There were numerous pulmonary nodules highly concerning for metastatic diseaseand punctate nonobstructing nephrolithiasis. She was readmitted on 09/12/2017. She describes the onset of lower abdominal pain on 09/09/2017. Pain markedly increased in intensity on 09/11/2017.  Staples were removed and the wound packed. She was started on doxycycline.  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  PET scan was ordered to evaluate enlarging lung nodules with concern for recurrent cervical cancer but scan was delayed due to insurance and need to be performed in [place].  Presented to ER on 12/03/2017 for abdominal pain and emesis.  Imaging concerning for worsening possible uterine infarct and she was admitted to hospital.  Pelvic MRI was unremarkable.  Remote scarring type changes of uterus thought to be possibly related to radiation.  She was discharged on  12/08/2017.  Underwent endoscopy and colonoscopy on 12/20/2017.    On 02/22/2018 she underwent laser ablation of condylomata around the anus and vagina under anesthesia with Dr. Johney Maine.   04/15/2018: Chest, abdomen, and pelvis CTrevealed innumerable (>100) cavitary nodules scattered in the lungs, moderately enlarging compared to the 11/08/2017 PET-CT, suspicious for metastatic disease. One index node in the RLL measures 1.0 x 1.1 cm (previously 0.6 x 0.6 cm). There were no new nodules. There was an ill-defined wall thickening in the rectosigmoid with surrounding stranding along fascia planes, probably sequela from prior radiation therapy. There was multilevel lumbar impingement due to spondylosis and degenerative disc disease. There was heterogeneous enhancement in the uterus (some possibly from prior radiation therapy).  04/23/2018:PET scan revealednumerous scattered solid and cavitary nodules in the lungs stable increased in size compared to the prior PET-CT from 11/08/2017.Largest nodule was 1.1 cm in the LUL (SUV 1.9).These demonstratedlow-grade metabolic activity up to a maximum SUV of about 2.3, increased from 11/08/2017.   Case was discussed at tumor board on 04/25/2018. Consensus to pursue CT-guided biopsy (05/06/18) which revealed: Metastatic adenocarcinoma, morphologically consistent with cervical adenocarcinoma.  She has history of chronic hepatitis C which is managed by GI.  Hepatitis C genotype is 2a/2c.  She receives B12 injections for history of B12 deficiency.  On 04/24/2018 she underwent left total knee replacement with Dr. [name].     Malignant neoplasm of overlapping sites of cervix ([place])   09/18/2017 Initial Diagnosis    Malignant neoplasm of overlapping sites of cervix ([place])    06/04/2018 -  Chemotherapy    The patient had palonosetron (ALOXI) injection 0.25 mg, 0.25 mg, Intravenous,  Once, 6 of 10 cycles Administration: 0.25 mg (06/04/2018), 0.25 mg (06/25/2018), 0.25  mg (09/30/2018), 0.25 mg (12/11/2018), 0.25 mg (07/23/2018), 0.25 mg (09/03/2018) pegfilgrastim (NEULASTA) injection 6 mg, 6 mg, Subcutaneous, Once, 6 of 10 cycles Administration: 6 mg (06/06/2018), 6 mg (06/26/2018), 6 mg (07/24/2018), 6 mg (09/04/2018), 6 mg (10/01/2018) CARBOplatin (PARAPLATIN) 500 mg in sodium chloride 0.9 % 250 mL chemo infusion, 500 mg (100 % of original dose 497.2 mg), Intravenous,  Once, 6 of 10 cycles Dose modification:   (original dose 497.2 mg, Cycle 1, Reason: Provider Judgment, Comment: difficulty with counts with initial chemo in [place]; advance dose as tolerated),   (original dose 497.2 mg, Cycle 5, Reason: Provider Judgment, Comment: return back to original dose) Administration: 500 mg (06/04/2018), 560 mg (06/25/2018), 500 mg (09/30/2018), 500 mg (12/11/2018), 500 mg (07/23/2018) PACLitaxel (TAXOL) 264 mg in sodium chloride 0.9 % 250 mL chemo infusion (> 85m/m2), 140 mg/m2 = 264 mg (100 % of original dose 140 mg/m2), Intravenous,  Once, 6 of 10 cycles Dose modification: 140 mg/m2 (original dose 140 mg/m2, Cycle 1, Reason: Provider Judgment, Comment: difficulty with [place] dose as tolerated), 155 mg/m2 (original dose 140 mg/m2, Cycle 2, Reason: Provider Judgment, Comment: advance as tolerated), 155 mg/m2 (original dose 175 mg/m2, Cycle 5, Reason: Provider Judgment, Comment: continue current dose) Administration: 264 mg (06/04/2018), 294 mg (06/25/2018), 294 mg (09/30/2018), 294 mg (12/11/2018), 294 mg (07/23/2018), 294 mg (09/03/2018)  for chemotherapy treatment.      ALLERGIES:  is allergic to ketamine.  MEDICATIONS:  Current Outpatient Medications  Medication Sig Dispense Refill  . amitriptyline (ELAVIL) 75 MG tablet Take 1 tablet (75 mg total) by mouth at bedtime. 90 tablet 1  . budesonide (ENTOCORT EC) 3 MG 24 hr capsule Take 3 capsules (9 mg total) by mouth daily. 30 capsule 0  . Calcium Carb-Cholecalciferol (CALCIUM  500 +D) 500-400 MG-UNIT TABS Take 2 tablets by mouth daily.    . diphenoxylate-atropine (LOMOTIL) 2.5-0.025 MG tablet Take 2 tablets by mouth 4 (four) times daily. (Patient taking differently: Take 4 tablets by mouth 4 (four) times daily. ) 30 tablet 0  . famotidine (PEPCID) 20 MG tablet Take 1 tablet (20 mg total) by mouth 2 (two) times daily as needed for heartburn or indigestion.    . hydrocortisone (ANUSOL-HC) 2.5 % rectal cream Place 1 application rectally 2 (two) times daily for 10 days. 28 g 0  . hydrocortisone (CORTEF) 10 MG tablet Take 1 tablet (10 mg total) by mouth 2 (two) times daily. 180 tablet 0  . hyoscyamine (LEVBID) 0.375 MG 12 hr tablet Take 1 tablet (0.375 mg total) by mouth 2 (two) times daily. 60 tablet 0  . Multiple Vitamins-Minerals (MULTIVITAMIN WITH MINERALS) tablet Take 1 tablet by mouth daily. 30 tablet 0  . ondansetron (ZOFRAN) 8 MG tablet Take 1 tablet (8 mg total) by mouth every 8 (eight) hours as needed for nausea or vomiting. 30 tablet 1  . oxyCODONE (ROXICODONE) 5 MG immediate release tablet Take 2 tablets (10 mg total) by mouth  every 8 (eight) hours as needed. 30 tablet 0  . pantoprazole (PROTONIX) 40 MG tablet Take 1 tablet (40 mg total) by mouth daily. 90 tablet 0  . potassium chloride SA (K-DUR,KLOR-CON) 20 MEQ tablet Take 1 tablet (20 mEq total) by mouth daily. for 4 days. 30 tablet 0  . rivaroxaban (XARELTO) 20 MG TABS tablet Take 20 mg by mouth daily with supper.    . VENTOLIN HFA 108 (90 Base) MCG/ACT inhaler Inhale 1-2 puffs into the lungs every 4 (four) hours as needed for shortness of breath.   0  . VIBERZI 100 MG TABS TAKE 1 TABLET BY MOUTH TWICE DAILY WITH  A  MEAL 60 tablet 0  . feeding supplement, ENSURE ENLIVE, (ENSURE ENLIVE) LIQD Take 237 mLs by mouth 2 (two) times daily between meals. (Patient not taking: Reported on 12/19/2018) 237 mL 12  . ondansetron (ZOFRAN) 4 MG tablet Take 1 tablet (4 mg total) by mouth every 6 (six) hours as needed for nausea.  (Patient not taking: Reported on 12/19/2018) 20 tablet 0  . promethazine (PHENERGAN) 25 MG tablet Take 1 tablet (25 mg total) by mouth every 8 (eight) hours as needed for nausea or vomiting. (Patient not taking: Reported on 12/19/2018) 30 tablet 0   No current facility-administered medications for this visit.    Facility-Administered Medications Ordered in Other Visits  Medication Dose Route Frequency Provider Last Rate Last Dose  . heparin lock flush 100 unit/mL  500 Units Intravenous Once Corcoran, Melissa C, MD      . sodium chloride 0.9 % 1,000 mL with potassium chloride 20 mEq infusion   Intravenous Once Karen Kitchens, NP        VITAL SIGNS: BP 118/80 (BP Location: Right Arm, Patient Position: Sitting)   Pulse 84   Temp (!) 97.2 F (36.2 C) (Tympanic)   Resp 18   Wt 173 lb 1.6 oz (78.5 kg)   BMI 26.32 kg/m  Filed Weights   12/19/18 1006  Weight: 173 lb 1.6 oz (78.5 kg)    Estimated body mass index is 26.32 kg/m as calculated from the following:   Height as of 12/16/18: 5' 8"  (1.727 m).   Weight as of this encounter: 173 lb 1.6 oz (78.5 kg).  LABS: CBC:    Component Value Date/Time   WBC 4.5 12/16/2018 1303   HGB 13.6 12/16/2018 1303   HCT 40.4 12/16/2018 1303   PLT 105 (L) 12/16/2018 1303   MCV 103.1 (H) 12/16/2018 1303   NEUTROABS 3.6 12/16/2018 1303   LYMPHSABS 0.6 (L) 12/16/2018 1303   MONOABS 0.1 12/16/2018 1303   EOSABS 0.0 12/16/2018 1303   BASOSABS 0.1 12/16/2018 1303   Comprehensive Metabolic Panel:    Component Value Date/Time   NA 134 (L) 12/18/2018 0938   K 3.7 12/18/2018 0938   CL 99 12/18/2018 0938   CO2 24 12/18/2018 0938   BUN 14 12/18/2018 0938   CREATININE 0.74 12/18/2018 0938   GLUCOSE 141 (H) 12/18/2018 0938   CALCIUM 9.4 12/18/2018 0938   AST 92 (H) 12/16/2018 1250   ALT 170 (H) 12/16/2018 1250   ALKPHOS 333 (H) 12/16/2018 1250   BILITOT 1.3 (H) 12/16/2018 1250   PROT 9.0 (H) 12/16/2018 1250   ALBUMIN 4.3 12/16/2018 1250     RADIOGRAPHIC STUDIES: Dg Abd 1 View  Result Date: 11/26/2018 CLINICAL DATA:  53 year old female. Evaluate for retained capsule. Subsequent encounter. EXAM: ABDOMEN - 1 VIEW COMPARISON:  11/25/2018. FINDINGS: Radiopaque foreign body right lower  quadrant of the abdomen consistent with bowel camera. This has shifted position from the prior examination now projecting at the level of the cecum/terminal ileum level. Post cholecystectomy. Decreased amount of stool compared to yesterday's exam. Scoliosis lumbar spine with superimposed degenerative changes. IMPRESSION: Radiopaque foreign body right lower quadrant of the abdomen consistent with bowel camera. This has shifted position from the prior examination now projecting at the level of the cecum/terminal ileum level. Electronically Signed   By: Genia Del M.D.   On: 11/26/2018 13:00   Dg Abd 1 View  Result Date: 11/25/2018 CLINICAL DATA:  Intermittent episodes of diarrhea, progressively worsening. Dizziness and GI bleed, sent to hospital as a direct admission. RIGHT lower quadrant abdominal pain radiating to LEFT side of lower abdomen. Known history of GERD, gastric ulcers, stage IV cervical cancer with last chemotherapy on 09/30/2018. EXAM: ABDOMEN - 1 VIEW COMPARISON:  None. FINDINGS: Overall bowel gas pattern is nonobstructive. No dilated small bowel loops seen. Fairly large amount of stool throughout the large bowel with associated mild distention of the RIGHT colon/transverse colon. Metallic object again projects over the RIGHT lower abdomen, presumed bowel camera. Cholecystectomy clips in the RIGHT upper quadrant. No evidence of soft tissue mass or abnormal fluid collection. No evidence of free intraperitoneal air. No evidence of renal or ureteral calculi. Degenerative changes within the scoliotic lumbar spine, moderate in degree. No acute appearing osseous abnormality. IMPRESSION: 1. Nonobstructive bowel gas pattern. Fairly large amount of stool  throughout the large bowel with associated mild distention of the RIGHT colon/transverse colon. 2. Presumed bowel camera within the RIGHT lower quadrant. Electronically Signed   By: Franki Cabot M.D.   On: 11/25/2018 12:48   Dg Abd 1 View  Result Date: 11/21/2018 CLINICAL DATA:  Diarrhea. EXAM: ABDOMEN - 1 VIEW COMPARISON:  10/18/2018. FINDINGS: Normal bowel gas pattern. Stable right pelvic phleboliths, cholecystectomy clips and right mid abdominal surgical staples. Interval 1.4 cm metallic object projected over the right mid abdomen to the right of the lumbar spine. Extensive lumbar spine degenerative changes and mild lower thoracic spine degenerative changes. Mild to moderate levoconvex lumbar scoliosis. IMPRESSION: 1. Interval 1.4 cm metallic object projected over the right mid abdomen. This could represent an ingested object such as a small bowel camera. This could also be external to the patient. 2. No acute abnormality. Electronically Signed   By: Claudie Revering M.D.   On: 11/21/2018 18:39   Ct Abdomen Pelvis W Contrast  Result Date: 11/26/2018 CLINICAL DATA:  Lower GI bleeding. EXAM: CT ABDOMEN AND PELVIS WITH CONTRAST TECHNIQUE: Multidetector CT imaging of the abdomen and pelvis was performed using the standard protocol following bolus administration of intravenous contrast. CONTRAST:  130m OMNIPAQUE IOHEXOL 300 MG/ML  SOLN COMPARISON:  KUB from earlier today. Chest CT 04/15/2018. Abdomen and pelvis CT 10/17/2018 FINDINGS: Lower chest: Numerous bilateral pulmonary nodules are identified and similar to multiple prior studies. Index 11 mm nodule identified right lower lobe on 03/04. Index nodule left lower lobe on 02/04 measures 7 mm. No pleural effusion. Hepatobiliary: No suspicious focal abnormality within the liver parenchyma. Gallbladder surgically absent. Trace intrahepatic biliary duct dilatation evident. Extrahepatic common duct measures 13 mm diameter. Common bile duct in the head of the  pancreas is 9 mm diameter. Pancreas: No focal mass lesion. No dilatation of the main duct. No intraparenchymal cyst. No peripancreatic edema. Spleen: No splenomegaly. No focal mass lesion. Adrenals/Urinary Tract: No adrenal nodule or mass. Tiny nonobstructing renal stones noted bilaterally (coronal image  53/5 right kidney and coronal image 60/5 left kidney). No evidence for hydroureter. The urinary bladder appears normal for the degree of distention. Stomach/Bowel: Stomach is unremarkable. No gastric wall thickening. No evidence of outlet obstruction. Duodenum is normally positioned as is the ligament of Treitz. No small bowel wall thickening. No small bowel dilatation. Patient appears to be status post right colectomy. Capsule endoscope is identified in the region of the ileocolic anastomosis. No bowel dilatation proximal to the level of the capsule. Wall thickening noted in the sigmoid colon with the similar appearance of the stranding around the sigmoid colon. Vascular/Lymphatic: There is abdominal aortic atherosclerosis without aneurysm. There is no gastrohepatic or hepatoduodenal ligament lymphadenopathy. No intraperitoneal or retroperitoneal lymphadenopathy. Small retroperitoneal lymph nodes are stable. No pelvic sidewall lymphadenopathy. Reproductive: Stable appearance of the uterus and cervix. Other: No substantial intraperitoneal free fluid Musculoskeletal: Edema noted in the pelvic floor, as before. No worrisome lytic or sclerotic osseous abnormality. IMPRESSION: 1. Status post right hemicolectomy. Capsule endoscope noted in the region of the ileocolic anastomosis. Difficult to definitively localize the capsule due to streak artifact from the metallic components, but it does appear distal to the anastomosis. No evidence for small bowel obstruction. 2. Persistent mild wall thickening in the sigmoid colon with edema/stranding in the surrounding fat in soft tissues of the pelvic floor. Imaging features likely  treatment related in this patient with a history of cervical cancer. 3. Stable intra and extrahepatic biliary duct dilatation in this patient status post cholecystectomy. 4. Stable appearance of numerous bilateral pulmonary nodules consistent with metastatic disease. 5.  Aortic Atherosclerois (ICD10-170.0) Electronically Signed   By: Misty Stanley M.D.   On: 11/26/2018 14:08   Ct Angio Abd/pel W And/or Wo Contrast  Result Date: 12/16/2018 CLINICAL DATA:  53 year old with abdominal pain. Abdominal pain is worse with bowel movements. History of cervical cancer. EXAM: CT ANGIOGRAPHY ABDOMEN AND PELVIS WITH CONTRAST AND WITHOUT CONTRAST TECHNIQUE: Multidetector CT imaging of the abdomen and pelvis was performed using the standard protocol during bolus administration of intravenous contrast. Multiplanar reconstructed images and MIPs were obtained and reviewed to evaluate the vascular anatomy. CONTRAST:  18m OMNIPAQUE IOHEXOL 350 MG/ML SOLN COMPARISON:  CT abdomen pelvis 11/26/2018 FINDINGS: VASCULAR Aorta: Normal caliber of the abdominal aorta with atherosclerotic disease. Negative for an aortic dissection. Celiac: Patent without evidence of aneurysm, dissection, vasculitis or significant stenosis. Mild atherosclerotic disease. SMA: Patent without evidence of aneurysm, dissection, vasculitis or significant stenosis. Renals: Both renal arteries are patent without evidence of aneurysm, dissection, vasculitis, fibromuscular dysplasia or significant stenosis. IMA: IMA is patent. Inflow: Patent without evidence of aneurysm, dissection, vasculitis or significant stenosis. Proximal Outflow: Proximal femoral arteries are patent bilaterally. Veins: Portal venous system is patent. Normal appearance of the IVC and renal veins. Iliac veins are patent. Review of the MIP images confirms the above findings. NON-VASCULAR Lower chest: Again noted are innumerable pulmonary nodules compatible with metastatic disease. Many of the  lesions have central cavitation. No pleural effusions. Hepatobiliary: Cholecystectomy. Chronic extrahepatic biliary dilatation measuring up to 1.4 cm and minimally changed. Main portal venous system is patent. Mild intrahepatic biliary dilatation. No suspicious liver lesions. Pancreas: Unremarkable. No pancreatic ductal dilatation or surrounding inflammatory changes. Spleen: Normal in size without focal abnormality. Adrenals/Urinary Tract: Normal adrenal glands. Normal appearance of both kidneys without hydronephrosis. Difficult to exclude mild bladder wall thickening but minimally changed from the previous examination. Stomach/Bowel: Normal appearance of the stomach and duodenum. Right hemicolectomy. Mucosal enhancement and wall thickening involving  the rectum and sigmoid colon. There is no significant small bowel wall thickening. Negative for a bowel obstruction. Lymphatic: No lymph node enlargement in the abdomen or pelvis. Reproductive: Stable appearance of the uterus and adnexal structures. Other: Stable stranding or edema in the pelvic region, including the presacral region. These findings are similar to the previous examination. Negative for free air. Musculoskeletal: Significant disc space narrowing and endplate disease at E0-C1 and L4-L5. Disc space narrowing at L1-L2. IMPRESSION: VASCULAR 1. Mild atherosclerotic disease the aorta and visceral arteries. No significant visceral artery stenosis. Negative for an aortic aneurysm. 2. Venous structures are unremarkable. NON-VASCULAR 1. Mucosal enhancement and wall thickening in the rectum and sigmoid colon. Findings are similar to the previous examination and probably represent chronic inflammation related to cervical cancer treatment. Stable edema or stranding in the pelvis. 2. Right hemicolectomy. 3. Innumerable pulmonary nodules compatible with metastatic disease. 4. Stable biliary dilatation, status post cholecystectomy. Electronically Signed   By: Markus Daft  M.D.   On: 12/16/2018 14:48    PERFORMANCE STATUS (ECOG) : 1 - Symptomatic but completely ambulatory  REVIEW OF SYSTEMS: As noted above. Otherwise, a complete review of systems is negative.  PHYSICAL EXAM: General: NAD, frail appearing, thin Cardiovascular: regular rate and rhythm Pulmonary: clear ant fields Abdomen: soft, nontender, + bowel sounds  GU: no suprapubic tenderness Extremities: no edema Skin: no rashes Neurological: Weakness but otherwise nonfocal  IMPRESSION: Patient was seen today in clinic for routine follow-up regarding her pain.  Patient says pain has been markedly improved on oxycodone.  She has been taking two 5 mg tablets every 8 hours and says the pain is averaging 7 out of 10, which she finds tolerable.  Her pain goal would be 5 or 6 out of 10.  Patient appears visibly more comfortable today in the clinic.  We will refill her oxycodone but switch to 10 mg tablets.  PDMP reviewed.  We reviewed safe storage of opioids.  Patient understands that she should not be driving on medication nor should she use other drugs or alcohol while taking opioids.  Pain contract signed and will be scanned into chart.  We will check random UDS when next seen.  Discussed advance directives.  Patient says that she has documents completed but needs to have them notarized.  She intends to make her daughter her healthcare power of attorney.  She says that she has talked to her daughter and family about decision making.  She also has a living will that needs notarized.  We discussed completing a MOST Form.  We will do so at time of next visit when Vynca will be live.  Note the patient is switching oncologist.  She has a scheduled follow-up with Dr. Rogue Bussing on 01/01/2019.  PLAN: -Continue current scope of treatment -Oxycodone 10 mg every 8 hours as needed for pain  -UDS ordered for 3/18 -Pain contract completed -ACP discussed. Will plan to complete MOST form when next seen -RTC in  1 month or sooner if needed   Patient expressed understanding and was in agreement with this plan. She also understands that She can call clinic at any time with any questions, concerns, or complaints.     Time Total: 20 minutes  Visit consisted of counseling and education dealing with the complex and emotionally intense issues of symptom management and palliative care in the setting of serious and potentially life-threatening illness.Greater than 50%  of this time was spent counseling and coordinating care related to the above  assessment and plan.  Signed by: Altha Harm, PhD, NP-C [phone] (Work Cell)

## 2018-12-20 ENCOUNTER — Encounter: Payer: Self-pay | Admitting: Urgent Care

## 2018-12-23 ENCOUNTER — Ambulatory Visit: Payer: Medicaid Other | Admitting: Hematology and Oncology

## 2018-12-23 ENCOUNTER — Other Ambulatory Visit: Payer: Medicaid Other

## 2018-12-23 ENCOUNTER — Ambulatory Visit: Payer: Medicaid Other

## 2018-12-25 ENCOUNTER — Other Ambulatory Visit: Payer: Self-pay

## 2018-12-25 ENCOUNTER — Emergency Department: Payer: Medicaid Other

## 2018-12-25 ENCOUNTER — Emergency Department
Admission: EM | Admit: 2018-12-25 | Discharge: 2018-12-26 | Disposition: A | Discharge status: Home / Self Care | Payer: Medicaid Other | Attending: Student in an Organized Health Care Education/Training Program | Admitting: Student in an Organized Health Care Education/Training Program

## 2018-12-25 DIAGNOSIS — R197 Diarrhea, unspecified: Secondary | ICD-10-CM | POA: Insufficient documentation

## 2018-12-25 DIAGNOSIS — R112 Nausea with vomiting, unspecified: Secondary | ICD-10-CM | POA: Diagnosis not present

## 2018-12-25 DIAGNOSIS — R1013 Epigastric pain: Secondary | ICD-10-CM | POA: Insufficient documentation

## 2018-12-25 DIAGNOSIS — Z87891 Personal history of nicotine dependence: Secondary | ICD-10-CM | POA: Diagnosis not present

## 2018-12-25 DIAGNOSIS — I10 Essential (primary) hypertension: Secondary | ICD-10-CM | POA: Insufficient documentation

## 2018-12-25 DIAGNOSIS — Z79899 Other long term (current) drug therapy: Secondary | ICD-10-CM | POA: Insufficient documentation

## 2018-12-25 LAB — CBC WITH DIFFERENTIAL/PLATELET
Abs Immature Granulocytes: 0.07 10*3/uL (ref 0.00–0.07)
Basophils Absolute: 0 10*3/uL (ref 0.0–0.1)
Basophils Relative: 1 %
Eosinophils Absolute: 0 10*3/uL (ref 0.0–0.5)
Eosinophils Relative: 0 %
HCT: 33.4 % — ABNORMAL LOW (ref 36.0–46.0)
Hemoglobin: 11 g/dL — ABNORMAL LOW (ref 12.0–15.0)
IMMATURE GRANULOCYTES: 1 %
Lymphocytes Relative: 5 %
Lymphs Abs: 0.4 10*3/uL — ABNORMAL LOW (ref 0.7–4.0)
MCH: 34.9 pg — ABNORMAL HIGH (ref 26.0–34.0)
MCHC: 32.9 g/dL (ref 30.0–36.0)
MCV: 106 fL — AB (ref 80.0–100.0)
Monocytes Absolute: 0.3 10*3/uL (ref 0.1–1.0)
Monocytes Relative: 4 %
Neutro Abs: 6.9 10*3/uL (ref 1.7–7.7)
Neutrophils Relative %: 89 %
Platelets: 74 10*3/uL — ABNORMAL LOW (ref 150–400)
RBC: 3.15 MIL/uL — ABNORMAL LOW (ref 3.87–5.11)
RDW: 12.7 % (ref 11.5–15.5)
WBC: 7.7 10*3/uL (ref 4.0–10.5)
nRBC: 0 % (ref 0.0–0.2)

## 2018-12-25 LAB — COMPREHENSIVE METABOLIC PANEL
ALT: 53 U/L — ABNORMAL HIGH (ref 0–44)
AST: 44 U/L — ABNORMAL HIGH (ref 15–41)
Albumin: 3.8 g/dL (ref 3.5–5.0)
Alkaline Phosphatase: 202 U/L — ABNORMAL HIGH (ref 38–126)
Anion gap: 11 (ref 5–15)
BUN: 16 mg/dL (ref 6–20)
CO2: 22 mmol/L (ref 22–32)
Calcium: 8.9 mg/dL (ref 8.9–10.3)
Chloride: 104 mmol/L (ref 98–111)
Creatinine, Ser: 0.87 mg/dL (ref 0.44–1.00)
GFR calc Af Amer: >60 mL/min (ref >60)
Glucose, Bld: 161 mg/dL — ABNORMAL HIGH (ref 70–99)
Potassium: 3.4 mmol/L — ABNORMAL LOW (ref 3.5–5.1)
Sodium: 137 mmol/L (ref 135–145)
Total Bilirubin: 0.7 mg/dL (ref 0.3–1.2)
Total Protein: 7.6 g/dL (ref 6.5–8.1)

## 2018-12-25 LAB — LIPASE, BLOOD: LIPASE: 22 U/L (ref 11–51)

## 2018-12-25 MED ORDER — ONDANSETRON HCL 4 MG/2ML IJ SOLN
4.0000 mg | Freq: Once | INTRAMUSCULAR | Status: AC
  Administered 2018-12-25: 4 mg via INTRAVENOUS
  Filled 2018-12-25: qty 2

## 2018-12-25 MED ORDER — MORPHINE SULFATE (PF) 4 MG/ML IV SOLN
6.0000 mg | INTRAVENOUS | Status: DC | PRN
  Administered 2018-12-25: 6 mg via INTRAVENOUS
  Filled 2018-12-25: qty 2

## 2018-12-25 NOTE — ED Triage Notes (Signed)
Pt arrived from home via EMS with complaints of chest pain that started about an hour ago. Pt states nausea, vomiting and diarrhea. EMS states that pt was sinus rhythm on monitor. Pt is said to be lethargic. Vs per Ems BP-166/91 HR-60 CBG-170 Temp-99.1 O2sat-99%RA. EMS states lung sounds clear. EMS gave 159mg of Fentanyl, 4 mg of Zofran, and 500cc NaCl. Pt sister is at the bedside. Pt is moaning out in pain.

## 2018-12-25 NOTE — ED Provider Notes (Signed)
[facility] Emergency Department Provider Note    First MD Initiated Contact with Patient 12/25/18 2301     (approximate)  I have reviewed the triage vital signs and the nursing notes.   HISTORY  Chief Complaint Abdominal pain   HPI Joanna Hall is a 53 y.o. female presents the ER for evaluation of severe epigastric and abdominal pain.  Patient with extensive history as listed below.  Is being followed by palliative care.  Last time that she took pain medication was 10 mg of oxycodone this morning.  She then started having worsening pain and started developing diarrhea as well as nausea and vomiting.  She is also having chest discomfort.  Denies any fevers.  States that pain is more epigastric than her last visit.  She is currently living at home with her parents.   she is on xarelto.   Past Medical History:  Diagnosis Date  . Abdominal pain 06/10/2018  . Abnormal cervical Papanicolaou smear 09/18/2017  . Anxiety   . Aortic atherosclerosis ([place])   . Arthritis    neck and knees  . 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 Blood transfusion without reported diagnosis   . Cervical cancer ([place]) 09/2016   mets lung  . Chronic anal fissure   . Chronic diarrhea   . Dyspnea   . Erosive gastropathy 09/18/2017  . Factor V Leiden mutation ([place])   . Fecal incontinence   . Genital warts   . GERD (gastroesophageal reflux disease)   . Heart murmur   . Hematochezia   . Hemorrhoids   . 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 Hepatitis, chronic ([place]) 05/05/2017  . History of cancer chemotherapy    completed 06/2017  . History of Clostridium difficile infection    while undergoing chemo.  Negative test 10/2017  . Ileocolic anastomotic leak   . 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 Intestinal infection due to Clostridium difficile 09/18/2017  . Macrocytic anemia with vitamin B12 deficiency   . Multiple  gastric ulcers   . Nausea vomiting and diarrhea   . Pancolitis ([place]) 07/27/2018  . Perianal condylomata   . Pneumonia    History of  . Pulmonary nodules   . Rectal bleeding   . Small bowel obstruction ([place]) 08/2017  . Stiff neck    limited right turn  . Vitamin D deficiency    Family History  Problem Relation Age of Onset  . Hypertension Father   . Diabetes Father   . Alcohol abuse Daughter   . Hypertension Maternal Grandmother   . Diabetes Maternal Grandmother   . Diabetes Paternal Grandmother   . Hypertension Paternal Grandmother    Past Surgical History:  Procedure Laterality Date  . CHOLECYSTECTOMY    . COLON SURGERY  08/2017   resection  . COLONOSCOPY WITH PROPOFOL N/A 12/20/2017   Procedure: COLONOSCOPY WITH PROPOFOL;  Surgeon: [name], MD;  Location: [facility] ENDOSCOPY;  Service: Gastroenterology;  Laterality: N/A;  . COLONOSCOPY WITH PROPOFOL N/A 07/30/2018   Procedure: COLONOSCOPY WITH PROPOFOL;  Surgeon: [name], MD;  Location: [facility] ENDOSCOPY;  Service: Gastroenterology;  Laterality: N/A;  . COLONOSCOPY WITH PROPOFOL N/A 10/10/2018   Procedure: COLONOSCOPY WITH PROPOFOL;  Surgeon: Lucilla Lame, MD;  Location: [facility] ENDOSCOPY;  Service: Endoscopy;  Laterality: N/A;  . DIAGNOSTIC LAPAROSCOPY    . ESOPHAGOGASTRODUODENOSCOPY (  EGD) WITH PROPOFOL N/A 12/20/2017   Procedure: ESOPHAGOGASTRODUODENOSCOPY (EGD) WITH PROPOFOL;  Surgeon: [name], MD;  Location: [facility] ENDOSCOPY;  Service: Gastroenterology;  Laterality: N/A;  . ESOPHAGOGASTRODUODENOSCOPY (EGD) WITH PROPOFOL  07/30/2018   Procedure: ESOPHAGOGASTRODUODENOSCOPY (EGD) WITH PROPOFOL;  Surgeon: [name], MD;  Location: [facility] ENDOSCOPY;  Service: Gastroenterology;;  . Otho Darner SIGMOIDOSCOPY N/A 11/21/2018   Procedure: FLEXIBLE SIGMOIDOSCOPY;  Surgeon: [name], MD;  Location: [facility] ENDOSCOPY;  Service: Gastroenterology;  Laterality: N/A;  . LAPAROTOMY N/A 08/31/2017    Procedure: EXPLORATORY LAPAROTOMY for SBO, ileocolectomy, removal of piece of uterine wall;  Surgeon: [name], MD;  Location: [facility] ORS;  Service: General;  Laterality: N/A;  . LASER ABLATION CONDOLAMATA N/A 02/22/2018   Procedure: LASER ABLATION/REMOVAL OF OEUMPNTIRWE AROUND ANUS AND VAGINA;  Surgeon: Michael Boston, MD;  Location: [address];  Service: General;  Laterality: N/A;  . OOPHORECTOMY    . PORTA CATH INSERTION N/A 05/13/2018   Procedure: PORTA CATH INSERTION;  Surgeon: Katha Cabal, MD;  Location: [place] CV LAB;  Service: Cardiovascular;  Laterality: N/A;  . SMALL INTESTINE SURGERY    . TANDEM RING INSERTION     x3  . THORACOTOMY    . TOTAL KNEE ARTHROPLASTY Left 04/24/2018   Procedure: TOTAL KNEE ARTHROPLASTY;  Surgeon: Lovell Sheehan, MD;  Location: [facility] ORS;  Service: Orthopedics;  Laterality: Left;   Patient Active Problem List   Diagnosis Date Noted  . Cancer associated pain   . Proctitis, radiation   . Palliative care encounter   . Encounter for monitoring opioid maintenance therapy 11/19/2018  . GI bleed 10/08/2018  . Adrenal insufficiency ([place]) 09/03/2018  . Chronic diarrhea 06/25/2018  . Chronic anticoagulation 06/25/2018  . Vomiting   . Encounter for antineoplastic chemotherapy 06/04/2018  . 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 Lung metastasis ([place]) 05/15/2018  . Closed fracture of distal end of radius 05/04/2018  . History of total knee arthroplasty 04/24/2018  . Osteoarthritis of left knee 02/28/2018  . Condyloma acuminatum of anus s/p ablation 02/22/2018 02/22/2018  . Condyloma acuminatum of vagina s/p ablation 02/22/2018 02/22/2018  . Positive ANA (antinuclear antibody) 02/04/2018  . Aortic atherosclerosis ([place]) 01/15/2018  . Elevated MCV 01/15/2018  . Anemia 01/15/2018  . Genital warts 01/15/2018  . Vitamin D deficiency 01/15/2018  . Pernicious anemia   . Impingement syndrome of shoulder region 12/19/2017  . Multiple  joint pain 12/19/2017  . Osteoarthritis of right knee 12/19/2017  . B12 deficiency 12/11/2017  . Lung nodules 12/11/2017  . History of Clostridium difficile infection 10/23/2017  . Factor V Leiden ([place]) 10/19/2017  . Goals of care, counseling/discussion 10/19/2017  . Cervical cancer, FIGO stage IB1 ([place]) 09/18/2017  . Chronic hepatitis C without hepatic coma ([place]) 09/18/2017  . Cytopenia 09/18/2017  . Diarrhea 09/18/2017  . Lower abdominal pain 09/18/2017  . 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 Malignant neoplasm of overlapping sites of cervix ([place]) 09/18/2017  . Renal insufficiency 09/18/2017  . Wound infection after surgery 09/12/2017  . Hypokalemia   . Hypomagnesemia   . Cervical arthritis 07/18/2017  . Dysuria 06/20/2017  . Metastatic cancer ([place]) 05/12/2017  . Essential hypertension 03/15/2017  . Anemia in other chronic diseases classified elsewhere 03/01/2017  . Chemotherapy-induced neutropenia ([place]) 01/29/2017  . 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 Authorizing Provider  amitriptyline (ELAVIL) 75 MG tablet Take 1 tablet (  75 mg total) by mouth at bedtime. 11/11/18 02/09/19  [name], MD  budesonide (ENTOCORT EC) 3 MG 24 hr capsule Take 3 capsules (9 mg total) by mouth daily. 10/12/18   Nicholes Mango, MD  Calcium Carb-Cholecalciferol (CALCIUM 500 +D) 500-400 MG-UNIT TABS Take 2 tablets by mouth daily.    [provider]  diphenoxylate-atropine (LOMOTIL) 2.5-0.025 MG tablet Take 2 tablets by mouth 4 (four) times daily. Patient taking differently: Take 4 tablets by mouth 4 (four) times daily.  11/26/18   Nicholes Mango, MD  famotidine (PEPCID) 20 MG tablet Take 1 tablet (20 mg total) by mouth 2 (two) times daily as needed for heartburn or indigestion. 11/25/18   Gouru, Illene Silver, MD  feeding supplement, ENSURE ENLIVE, (ENSURE ENLIVE) LIQD Take 237 mLs by mouth 2 (two) times  daily between meals. Patient not taking: Reported on 12/19/2018 11/27/18   Nicholes Mango, MD  hydrocortisone (CORTEF) 10 MG tablet Take 1 tablet (10 mg total) by mouth 2 (two) times daily. 11/11/18 02/09/19  [name], MD  hyoscyamine (LEVBID) 0.375 MG 12 hr tablet Take 1 tablet (0.375 mg total) by mouth 2 (two) times daily. 10/21/18   [name], MD  Multiple Vitamins-Minerals (MULTIVITAMIN WITH MINERALS) tablet Take 1 tablet by mouth daily. 08/01/18 08/01/19  Loletha Grayer, MD  ondansetron (ZOFRAN ODT) 4 MG disintegrating tablet Take 1 tablet (4 mg total) by mouth every 8 (eight) hours as needed for nausea or vomiting. 12/26/18   Merlyn Lot, MD  ondansetron (ZOFRAN) 4 MG tablet Take 1 tablet (4 mg total) by mouth every 6 (six) hours as needed for nausea. Patient not taking: Reported on 12/19/2018 11/25/18   Nicholes Mango, MD  ondansetron (ZOFRAN) 8 MG tablet Take 1 tablet (8 mg total) by mouth every 8 (eight) hours as needed for nausea or vomiting. 11/07/18   Karen Kitchens, NP  Oxycodone HCl 10 MG TABS Take 1 tablet (10 mg total) by mouth every 8 (eight) hours as needed. 12/19/18   Borders, Kirt Boys, NP  pantoprazole (PROTONIX) 40 MG tablet Take 1 tablet (40 mg total) by mouth daily. 10/30/18   Karen Kitchens, NP  potassium chloride SA (K-DUR,KLOR-CON) 20 MEQ tablet Take 1 tablet (20 mEq total) by mouth daily. for 4 days. 11/06/18   Lequita Asal, MD  promethazine (PHENERGAN) 25 MG tablet Take 1 tablet (25 mg total) by mouth every 8 (eight) hours as needed for nausea or vomiting. Patient not taking: Reported on 12/19/2018 12/18/18   Karen Kitchens, NP  rivaroxaban (XARELTO) 20 MG TABS tablet Take 20 mg by mouth daily with supper.    [provider]  VENTOLIN HFA 108 (90 Base) MCG/ACT inhaler Inhale 1-2 puffs into the lungs every 4 (four) hours as needed for shortness of breath.     [provider]  VIBERZI 100 MG TABS TAKE 1 TABLET BY MOUTH TWICE DAILY WITH  A  MEAL  10/11/18   Verlon Au, NP    Allergies Ketamine    Social History Social History   Tobacco Use  . Smoking status: Former Smoker    Last attempt to quit: 10/16/2006    Years since quitting: 12.2  . Smokeless tobacco: Never Used  Substance Use Topics  . Alcohol use: Not Currently    Frequency: Never    Comment: seldom  . Drug use: Yes    Types: Marijuana    Review of Systems Patient denies headaches, rhinorrhea, blurry vision, numbness, shortness  of breath, chest pain, edema, cough, abdominal pain, nausea, vomiting, diarrhea, dysuria, fevers, rashes or hallucinations unless otherwise stated above in HPI. ____________________________________________   PHYSICAL EXAM:  VITAL SIGNS: Vitals:   12/26/18 0030 12/26/18 0031  BP: 94/62   Pulse: 76   Resp: 15   Temp:  (!) 97.4 F (36.3 C)  SpO2: 100%     Constitutional: Alert and oriented.  Very uncomfortable appearing moaning in the bed. Eyes: Conjunctivae are normal.  Head: Atraumatic. Nose: No congestion/rhinnorhea. Mouth/Throat: Mucous membranes are moist.   Neck: No stridor. Painless ROM.  Cardiovascular: Normal rate, regular rhythm. Grossly normal heart sounds.  Good peripheral circulation. Respiratory: Normal respiratory effort.  No retractions. Lungs CTAB. Gastrointestinal: Soft but with diffuse mild tenderness on exam.  No focal peritonitis.  No distention. No abdominal bruits. No CVA tenderness. Genitourinary: deferred Musculoskeletal: No lower extremity tenderness nor edema.  No joint effusions. Neurologic:  Normal speech and language. No gross focal neurologic deficits are appreciated. No facial droop Skin:  Skin is warm, dry and intact. No rash noted. Psychiatric: Mood and affect are normal. Speech and behavior are normal.  ____________________________________________   LABS (all labs ordered are listed, but only abnormal results are displayed)  Results for orders placed or performed during the  hospital encounter of 12/25/18 (from the past 24 hour(s))  CBC with Differential/Platelet     Status: Abnormal   Collection Time: 12/25/18 10:33 PM  Result Value Ref Range   WBC 7.7 4.0 - 10.5 K/uL   RBC 3.15 (L) 3.87 - 5.11 MIL/uL   Hemoglobin 11.0 (L) 12.0 - 15.0 g/dL   HCT 33.4 (L) 36.0 - 46.0 %   MCV 106.0 (H) 80.0 - 100.0 fL   MCH 34.9 (H) 26.0 - 34.0 pg   MCHC 32.9 30.0 - 36.0 g/dL   RDW 12.7 11.5 - 15.5 %   Platelets 74 (L) 150 - 400 K/uL   nRBC 0.0 0.0 - 0.2 %   Neutrophils Relative % 89 %   Neutro Abs 6.9 1.7 - 7.7 K/uL   Lymphocytes Relative 5 %   Lymphs Abs 0.4 (L) 0.7 - 4.0 K/uL   Monocytes Relative 4 %   Monocytes Absolute 0.3 0.1 - 1.0 K/uL   Eosinophils Relative 0 %   Eosinophils Absolute 0.0 0.0 - 0.5 K/uL   Basophils Relative 1 %   Basophils Absolute 0.0 0.0 - 0.1 K/uL   Immature Granulocytes 1 %   Abs Immature Granulocytes 0.07 0.00 - 0.07 K/uL  Comprehensive metabolic panel     Status: Abnormal   Collection Time: 12/25/18 10:33 PM  Result Value Ref Range   Sodium 137 135 - 145 mmol/L   Potassium 3.4 (L) 3.5 - 5.1 mmol/L   Chloride 104 98 - 111 mmol/L   CO2 22 22 - 32 mmol/L   Glucose, Bld 161 (H) 70 - 99 mg/dL   BUN 16 6 - 20 mg/dL   Creatinine, Ser 0.87 0.44 - 1.00 mg/dL   Calcium 8.9 8.9 - 10.3 mg/dL   Total Protein 7.6 6.5 - 8.1 g/dL   Albumin 3.8 3.5 - 5.0 g/dL   AST 44 (H) 15 - 41 U/L   ALT 53 (H) 0 - 44 U/L   Alkaline Phosphatase 202 (H) 38 - 126 U/L   Total Bilirubin 0.7 0.3 - 1.2 mg/dL   GFR calc non Af Amer >60 >60 mL/min   GFR calc Af Amer >60 >60 mL/min   Anion gap 11 5 -  15  Lipase, blood     Status: None   Collection Time: 12/25/18 10:33 PM  Result Value Ref Range   Lipase 22 11 - 51 U/L   ____________________________________________  EKG My review and personal interpretation at Time: 22:19   Indication: N/V  Rate: 70  Rhythm: sinus Axis: normal Other: normal intervals, no stemi ____________________________________________   RADIOLOGY  I personally reviewed all radiographic images ordered to evaluate for the above acute complaints and reviewed radiology reports and findings.  These findings were personally discussed with the patient.  Please see medical record for radiology report.  ____________________________________________   PROCEDURES  Procedure(s) performed:  Procedures    Critical Care performed: no ____________________________________________   INITIAL IMPRESSION / ASSESSMENT AND PLAN / ED COURSE  Pertinent labs & imaging results that were available during my care of the patient were reviewed by me and considered in my medical decision making (see chart for details).   DDX: Gastritis, enteritis, SBO, cancer pain, perforation  Joanna Hall is a 53 y.o. who presents to the ED with nausea vomiting epigastric pain.  Patient appears very uncomfortable.  May in fact be withdrawing.  X-ray and blood work was sent for the above differential.  Had similar presentation just recently that seem to have been related to poorly controlled cancer related pain.  Will be aggressive with IV pain medication as well as fluids and antiemetic and reassess.  Clinical Course as of Dec 25 120  Thu Dec 26, 2018  0027 X-ray is reassuring.  Blood work roughly at baseline compared to previous.  Mildly worsening thrombocytopenia.  She is requesting something to eat.  Feels much improved after IV morphine.  Will trial her home oral medication.   [PR]  0118 Patient reassessed.  States that her pain is much improved.  She is tolerating oral hydration feels appropriate discharge home.  She has follow-up with palliative care tomorrow morning.  Have discussed with the patient and available family all diagnostics and treatments performed thus far and all questions were answered to the best of my ability. The patient demonstrates understanding and agreement with plan.    [PR]    Clinical Course User Index [PR] Merlyn Lot, MD     As part of my medical decision making, I reviewed the following data within the [place] notes reviewed and incorporated, Labs reviewed, notes from prior ED visits and [place] Controlled Substance Database   ____________________________________________   FINAL CLINICAL IMPRESSION(S) / ED DIAGNOSES  Final diagnoses:  Nausea vomiting and diarrhea  Epigastric pain      NEW MEDICATIONS STARTED DURING THIS VISIT:  New Prescriptions   ONDANSETRON (ZOFRAN ODT) 4 MG DISINTEGRATING TABLET    Take 1 tablet (4 mg total) by mouth every 8 (eight) hours as needed for nausea or vomiting.     Note:  This document was prepared using Dragon voice recognition software and may include unintentional dictation errors.    Merlyn Lot, MD 12/26/18 [id number]

## 2018-12-26 ENCOUNTER — Other Ambulatory Visit: Payer: Self-pay

## 2018-12-26 ENCOUNTER — Other Ambulatory Visit: Payer: Self-pay | Admitting: Urgent Care

## 2018-12-26 ENCOUNTER — Inpatient Hospital Stay: Payer: Medicaid Other | Admitting: *Deleted

## 2018-12-26 ENCOUNTER — Inpatient Hospital Stay (HOSPITAL_BASED_OUTPATIENT_CLINIC_OR_DEPARTMENT_OTHER): Payer: Medicaid Other | Admitting: Hospice and Palliative Medicine

## 2018-12-26 ENCOUNTER — Inpatient Hospital Stay: Payer: Medicaid Other

## 2018-12-26 DIAGNOSIS — C538 Malignant neoplasm of overlapping sites of cervix uteri: Secondary | ICD-10-CM

## 2018-12-26 DIAGNOSIS — K529 Noninfective gastroenteritis and colitis, unspecified: Secondary | ICD-10-CM

## 2018-12-26 DIAGNOSIS — C539 Malignant neoplasm of cervix uteri, unspecified: Secondary | ICD-10-CM | POA: Diagnosis not present

## 2018-12-26 DIAGNOSIS — G893 Neoplasm related pain (acute) (chronic): Secondary | ICD-10-CM | POA: Diagnosis not present

## 2018-12-26 DIAGNOSIS — Z5112 Encounter for antineoplastic immunotherapy: Secondary | ICD-10-CM | POA: Diagnosis not present

## 2018-12-26 DIAGNOSIS — Z515 Encounter for palliative care: Secondary | ICD-10-CM

## 2018-12-26 DIAGNOSIS — Z95828 Presence of other vascular implants and grafts: Secondary | ICD-10-CM

## 2018-12-26 DIAGNOSIS — C7800 Secondary malignant neoplasm of unspecified lung: Secondary | ICD-10-CM

## 2018-12-26 LAB — COMPREHENSIVE METABOLIC PANEL
ALT: 47 U/L — ABNORMAL HIGH (ref 0–44)
AST: 31 U/L (ref 15–41)
Albumin: 3.8 g/dL (ref 3.5–5.0)
Alkaline Phosphatase: 186 U/L — ABNORMAL HIGH (ref 38–126)
Anion gap: 7 (ref 5–15)
BUN: 11 mg/dL (ref 6–20)
CO2: 24 mmol/L (ref 22–32)
Calcium: 8.6 mg/dL — ABNORMAL LOW (ref 8.9–10.3)
Chloride: 105 mmol/L (ref 98–111)
Creatinine, Ser: 0.76 mg/dL (ref 0.44–1.00)
GFR calc Af Amer: >60 mL/min (ref >60)
GFR calc non Af Amer: >60 mL/min (ref >60)
Glucose, Bld: 96 mg/dL (ref 70–99)
Potassium: 3.9 mmol/L (ref 3.5–5.1)
SODIUM: 136 mmol/L (ref 135–145)
Total Bilirubin: 0.5 mg/dL (ref 0.3–1.2)
Total Protein: 7.8 g/dL (ref 6.5–8.1)

## 2018-12-26 LAB — CBC WITH DIFFERENTIAL/PLATELET
Abs Immature Granulocytes: 0.02 10*3/uL (ref 0.00–0.07)
BASOS ABS: 0 10*3/uL (ref 0.0–0.1)
Basophils Relative: 0 %
Eosinophils Absolute: 0 10*3/uL (ref 0.0–0.5)
Eosinophils Relative: 0 %
HCT: 29.4 % — ABNORMAL LOW (ref 36.0–46.0)
Hemoglobin: 9.8 g/dL — ABNORMAL LOW (ref 12.0–15.0)
Immature Granulocytes: 0 %
Lymphocytes Relative: 17 %
Lymphs Abs: 0.8 10*3/uL (ref 0.7–4.0)
MCH: 34.9 pg — ABNORMAL HIGH (ref 26.0–34.0)
MCHC: 33.3 g/dL (ref 30.0–36.0)
MCV: 104.6 fL — ABNORMAL HIGH (ref 80.0–100.0)
Monocytes Absolute: 0.2 10*3/uL (ref 0.1–1.0)
Monocytes Relative: 5 %
NRBC: 0 % (ref 0.0–0.2)
Neutro Abs: 3.8 10*3/uL (ref 1.7–7.7)
Neutrophils Relative %: 78 %
Platelets: 94 10*3/uL — ABNORMAL LOW (ref 150–400)
RBC: 2.81 MIL/uL — AB (ref 3.87–5.11)
RDW: 12.6 % (ref 11.5–15.5)
WBC: 4.9 10*3/uL (ref 4.0–10.5)

## 2018-12-26 LAB — MAGNESIUM: MAGNESIUM: 1.4 mg/dL — AB (ref 1.7–2.4)

## 2018-12-26 MED ORDER — OXYCODONE HCL 5 MG PO TABS
10.0000 mg | ORAL_TABLET | ORAL | Status: DC | PRN
  Administered 2018-12-26: 10 mg via ORAL
  Filled 2018-12-26: qty 2

## 2018-12-26 MED ORDER — SODIUM CHLORIDE 0.9% FLUSH
10.0000 mL | Freq: Once | INTRAVENOUS | Status: AC
  Administered 2018-12-26: 10 mL via INTRAVENOUS
  Filled 2018-12-26: qty 10

## 2018-12-26 MED ORDER — OXYCODONE HCL 10 MG PO TABS: 10.0000 mg | ORAL_TABLET | Freq: Four times a day (QID) | ORAL | 0 refills | Status: DC | PRN

## 2018-12-26 MED ORDER — ONDANSETRON 4 MG PO TBDP: 4.0000 mg | ORAL_TABLET | Freq: Three times a day (TID) | ORAL | 0 refills | Status: DC | PRN

## 2018-12-26 MED ORDER — CLONIDINE HCL 0.1 MG PO TABS
0.1000 mg | ORAL_TABLET | Freq: Once | ORAL | Status: DC
  Filled 2018-12-26: qty 1

## 2018-12-26 MED ORDER — HEPARIN SOD (PORK) LOCK FLUSH 100 UNIT/ML IV SOLN
500.0000 [IU] | Freq: Once | INTRAVENOUS | Status: AC | PRN
  Administered 2018-12-26: 500 [IU]

## 2018-12-26 MED ORDER — MAGNESIUM SULFATE 4 GM/100ML IV SOLN
4.0000 g | Freq: Once | INTRAVENOUS | Status: AC
  Administered 2018-12-26: 4 g via INTRAVENOUS
  Filled 2018-12-26: qty 100

## 2018-12-26 MED ORDER — SODIUM CHLORIDE 0.9 % IV SOLN
Freq: Once | INTRAVENOUS | Status: AC
  Administered 2018-12-26: 12:00:00 via INTRAVENOUS
  Filled 2018-12-26: qty 250

## 2018-12-26 MED ORDER — SODIUM CHLORIDE 0.9 % IV BOLUS
1000.0000 mL | Freq: Once | INTRAVENOUS | Status: AC
  Administered 2018-12-26: 1000 mL via INTRAVENOUS

## 2018-12-26 NOTE — Progress Notes (Signed)
[place]  Telephone:([phone] [phone]   Name: Joanna Hall Date: 12/26/2018 MRN: [id number]  DOB: [birth year]  Patient Care Team: Arnetha Courser, MD as PCP - General (Family Medicine) Mellody Drown, MD as Consulting Physician (Obstetrics and Gynecology) Lenor Coffin, MD as Attending Physician (Gynecology) Lequita Asal, MD as Medical Oncologist (Hematology and Oncology) [name], MD as Consulting Physician (Gastroenterology) Michael Boston, MD as Consulting Physician (General Surgery) Lovell Sheehan, MD as Consulting Physician (Orthopedic Surgery) Clent Jacks, RN as Registered Nurse    REASON FOR CONSULTATION: Palliative Care consult requested for this 53 y.o. 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  PMH also notable for history of right hemicolectomy with chronic inflammatory changes to the colon.  Patient is followed by GI.  She was recently hospitalized 11/19/2020 11/25/2018 with GI bleed.  Patient underwent flex sig on 2/6 but was mostly unremarkable except edematous mucosa.  Patient has chronic abdominal pain.  She presented to the ER on 12/16/2018 with acute on chronic intractable abdominal pain.  Patient had CT angio of the abdomen/pelvis, which revealed mucosal enhancement and wall thickening in the rectum and sigmoid colon but was essentially unchanged from previous studies.  Palliative care was consulted to help address symptoms and goals.  SOCIAL HISTORY:    Patient is not married.  She lives at home with her mother and father.  She has an adult daughter who lives in [place], [place].  She had another daughter who is now deceased.  ADVANCE DIRECTIVES:  Not on file   CODE STATUS:   PAST MEDICAL HISTORY: Past Medical History:  Diagnosis Date   Abdominal  pain 06/10/2018   Abnormal cervical Papanicolaou smear 09/18/2017   Anxiety    Aortic atherosclerosis (HCC)    Arthritis    neck and knees   Blood clots in brain    both lungs and right kidney   Blood transfusion without reported diagnosis    Cervical cancer ([place]) 09/2016   mets lung   Chronic anal fissure    Chronic diarrhea    Dyspnea    Erosive gastropathy 09/18/2017   Factor V Leiden mutation ([place])    Fecal incontinence    Genital warts    GERD (gastroesophageal reflux disease)    Heart murmur    Hematochezia    Hemorrhoids    Hepatitis C    Chronic, 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   Intestinal infection due to Clostridium difficile 09/18/2017   Macrocytic anemia with vitamin B12 deficiency    Multiple gastric ulcers    Nausea vomiting and diarrhea    Pancolitis ([place]) 07/27/2018   Perianal condylomata    Pneumonia    History of   Pulmonary nodules    Rectal bleeding    Small bowel obstruction ([place]) 08/2017   Stiff neck    limited right turn   Vitamin D deficiency     PAST SURGICAL HISTORY:  Past Surgical History:  Procedure Laterality Date   CHOLECYSTECTOMY     COLON SURGERY  08/2017   resection   COLONOSCOPY WITH PROPOFOL N/A 12/20/2017   Procedure: COLONOSCOPY WITH PROPOFOL;  Surgeon: [name], MD;  Location: [facility] ENDOSCOPY;  Service: Gastroenterology;  Laterality: N/A;   COLONOSCOPY WITH PROPOFOL N/A 07/30/2018   Procedure: COLONOSCOPY WITH PROPOFOL;  Surgeon: [name], MD;  Location: [facility] ENDOSCOPY;  Service: Gastroenterology;  Laterality: N/A;   COLONOSCOPY WITH PROPOFOL N/A 10/10/2018   Procedure: COLONOSCOPY WITH  PROPOFOL;  Surgeon: Lucilla Lame, MD;  Location: [facility] ENDOSCOPY;  Service: Endoscopy;  Laterality: N/A;   DIAGNOSTIC LAPAROSCOPY     ESOPHAGOGASTRODUODENOSCOPY (EGD) WITH PROPOFOL N/A 12/20/2017   Procedure: ESOPHAGOGASTRODUODENOSCOPY (EGD) WITH PROPOFOL;  Surgeon: [name], MD;  Location: [facility] ENDOSCOPY;  Service: Gastroenterology;  Laterality: N/A;   ESOPHAGOGASTRODUODENOSCOPY (EGD) WITH PROPOFOL  07/30/2018   Procedure: ESOPHAGOGASTRODUODENOSCOPY (EGD) WITH PROPOFOL;  Surgeon: [name], MD;  Location: [facility] ENDOSCOPY;  Service: Gastroenterology;;   [place] N/A 11/21/2018   Procedure: FLEXIBLE SIGMOIDOSCOPY;  Surgeon: [name], MD;  Location: [facility] ENDOSCOPY;  Service: Gastroenterology;  Laterality: N/A;   LAPAROTOMY N/A 08/31/2017   Procedure: EXPLORATORY LAPAROTOMY for SBO, ileocolectomy, removal of piece of uterine wall;  Surgeon: [name], MD;  Location: [facility] ORS;  Service: General;  Laterality: N/A;   LASER ABLATION CONDOLAMATA N/A 02/22/2018   Procedure: LASER ABLATION/REMOVAL OF [id number] AROUND ANUS AND VAGINA;  Surgeon: Michael Boston, MD;  Location: [place];  Service: General;  Laterality: N/A;   OOPHORECTOMY     PORTA CATH INSERTION N/A 05/13/2018   Procedure: PORTA CATH INSERTION;  Surgeon: Katha Cabal, MD;  Location: [facility] CV LAB;  Service: Cardiovascular;  Laterality: N/A;   SMALL INTESTINE SURGERY     TANDEM RING INSERTION     x3   THORACOTOMY     TOTAL KNEE ARTHROPLASTY Left 04/24/2018   Procedure: TOTAL KNEE ARTHROPLASTY;  Surgeon: Lovell Sheehan, MD;  Location: [facility] ORS;  Service: Orthopedics;  Laterality: Left;    HEMATOLOGY/ONCOLOGY HISTORY:  Oncology History   Patient was diagnosed with cervical adenocarcinoma in [place] in 09/2016.  She has had a long-standing history of abnormal Pap smears.  She presented with an ovarian cyst.  Laparoscopic surgery was pursued but was  difficult due to scar tissue.  Had BSO and rupture of cyst with purulent drainage into the abdomen.  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  Radical hysterectomy was aborted.  She was treated by Dr. Christene Slates at [facility] center in [place], [place].  Decision was made to pursue concurrent chemotherapy (weekly cisplatin) and radiation.  She received treatment from 11/2016-05/2017.  01/2017 cisplatin x 2 and carboplatin x 1 (01/29/2017) due to ARF and XRT.  XRT was followed by T & O on 02/01/2017 and T & N 02/10/2017 and 02/20/2017.  Course was complicated by 80 pound weight loss, nausea, vomiting, electrolyte wasting (potassium and magnesium).  She describes that.  Is been sick constantly requiring at least 20 hospitalizations.  Follow-up CT chest and PET on 06/2017. Per patient, 'radiation worked' and no disease in the abdomen.  At that time she was noted to have lung nodules that were growing and follow-up imaging was scheduled for 10/2017.  She was admitted to hospital in [place] for small bowel obstruction which was managed conservatively and she was home for a week prior to traveling to [place] for Thanksgiving holiday where she has family.  She presented to ER in [place] on  08/2017 with nausea, vomiting, and lower abdominal pain.  Symptoms did not respond to conservative treatment.  CT on 08/26/2017 revealed small bowel obstruction with transition in the pelvis just superior to the uterus rather was a long segment of distal ileum with fatty wall thickening compatible with chronic inflammation and/or radiation enteritis. Imaging showed numerous pulmonary nodules consistent with metastatic disease. She underwent laparotomy and right ileocolectomy on 08/31/2017 at [facility].  Surgical findings revealed a thickened, matted, and scarred piece of distal small bowel close to the ileocecal valve.  She was discharged on 09/05/2017.  Pain markedly increased in  intensity and imaging was performed on 09/11/2017 which revealed: Debris within the anterior abdominal wall incision concerning for infection versus packing material, s/p post ileo-colectomy with expected postoperative changes, mild colonic ileus, numerous pulmonary nodules highly concerning for metastatic disease, punctate nonobstructing nephrolithiasis.  Staples were removed and one was packed.  She was started on doxycycline.  Abdominal and pelvic CT without contrast on 09/11/2017 revealed debris within anterior abdominal wall incision concerning for infection, versus packing material.She is s/pileocolectomy with expected postoperative changes and mild colonic ileus. There were numerous pulmonary nodules highly concerning for metastatic diseaseand punctate nonobstructing nephrolithiasis. She was readmitted on 09/12/2017. She describes the onset of lower abdominal pain on 09/09/2017. Pain markedly increased in intensity on 09/11/2017.  Staples were removed and the wound packed. She was started on doxycycline.  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  PET scan was ordered to evaluate enlarging lung nodules with concern for recurrent cervical cancer but scan was delayed due to insurance and need to be performed in [place].  Presented to ER on 12/03/2017 for abdominal pain and emesis.  Imaging concerning for worsening possible uterine infarct and she was admitted to hospital.  Pelvic MRI was unremarkable.  Remote scarring type changes of uterus thought to be possibly related to radiation.  She was discharged on  12/08/2017.  Underwent endoscopy and colonoscopy on 12/20/2017.    On 02/22/2018 she underwent laser ablation of condylomata around the anus and vagina under anesthesia with Dr. Johney Maine.   04/15/2018: Chest, abdomen, and pelvis CTrevealed innumerable (>100) cavitary nodules scattered in the lungs, moderately enlarging compared to the 11/08/2017 PET-CT, suspicious for metastatic disease. One index node in the RLL measures 1.0 x 1.1 cm (previously 0.6 x 0.6 cm). There were no new nodules. There was an ill-defined wall thickening in the rectosigmoid with surrounding stranding along fascia planes, probably sequela from prior radiation therapy. There was multilevel lumbar impingement due to spondylosis and degenerative disc disease. There was heterogeneous enhancement in the uterus (some possibly from prior radiation therapy).  04/23/2018:PET scan revealednumerous scattered solid and cavitary nodules in the lungs stable increased in size compared to the prior PET-CT from 11/08/2017.Largest nodule was 1.1 cm in the LUL (SUV 1.9).These demonstratedlow-grade metabolic activity up to a maximum SUV of about 2.3, increased from 11/08/2017.   Case was discussed at tumor board on 04/25/2018. Consensus to pursue CT-guided biopsy (05/06/18) which revealed: Metastatic adenocarcinoma, morphologically consistent with cervical adenocarcinoma.  She has history of chronic hepatitis C which is managed by GI.  Hepatitis C genotype is 2a/2c.  She receives B12 injections for history of B12 deficiency.  On 04/24/2018 she underwent left total knee replacement with Dr. [name].     Malignant neoplasm of overlapping sites of cervix ([place])   09/18/2017 Initial Diagnosis    Malignant neoplasm of overlapping sites of cervix ([place])    06/04/2018 -  Chemotherapy    The patient had palonosetron (ALOXI) injection 0.25 mg, 0.25 mg, Intravenous,  Once, 6 of 10 cycles Administration: 0.25 mg (06/04/2018), 0.25 mg (06/25/2018), 0.25  mg (09/30/2018), 0.25 mg (12/11/2018), 0.25 mg (07/23/2018), 0.25 mg (09/03/2018) pegfilgrastim (NEULASTA) injection 6 mg, 6 mg, Subcutaneous, Once, 6 of 10 cycles Administration: 6 mg (06/06/2018), 6 mg (06/26/2018), 6 mg (07/24/2018), 6 mg (09/04/2018), 6 mg (10/01/2018) CARBOplatin (PARAPLATIN) 500 mg in sodium chloride 0.9 % 250 mL chemo infusion, 500 mg (100 % of original dose 497.2 mg), Intravenous,  Once, 6 of 10 cycles Dose modification:   (original dose 497.2 mg, Cycle 1, Reason: Provider Judgment, Comment: difficulty with counts with initial chemo in [place]; advance dose as tolerated),   (original dose 497.2 mg, Cycle 5, Reason: Provider Judgment, Comment: return back to original dose) Administration: 500 mg (06/04/2018), 560 mg (06/25/2018), 500 mg (09/30/2018), 500 mg (12/11/2018), 500 mg (07/23/2018) PACLitaxel (TAXOL) 264 mg in sodium chloride 0.9 % 250 mL chemo infusion (> 20m/m2), 140 mg/m2 = 264 mg (100 % of original dose 140 mg/m2), Intravenous,  Once, 6 of 10 cycles Dose modification: 140 mg/m2 (original dose 140 mg/m2, Cycle 1, Reason: Provider Judgment, Comment: difficulty with [place] dose as tolerated), 155 mg/m2 (original dose 140 mg/m2, Cycle 2, Reason: Provider Judgment, Comment: advance as tolerated), 155 mg/m2 (original dose 175 mg/m2, Cycle 5, Reason: Provider Judgment, Comment: continue current dose) Administration: 264 mg (06/04/2018), 294 mg (06/25/2018), 294 mg (09/30/2018), 294 mg (12/11/2018), 294 mg (07/23/2018), 294 mg (09/03/2018)  for chemotherapy treatment.      ALLERGIES:  is allergic to ketamine.  MEDICATIONS:  Current Outpatient Medications  Medication Sig Dispense Refill   amitriptyline (ELAVIL) 75 MG tablet Take 1 tablet (75 mg total) by mouth at bedtime. 90 tablet 1   budesonide (ENTOCORT EC) 3 MG 24 hr capsule Take 3 capsules (9 mg total) by mouth daily. 30 capsule 0   Calcium Carb-Cholecalciferol (CALCIUM  500 +D) 500-400 MG-UNIT TABS Take 2 tablets by mouth daily.     diphenoxylate-atropine (LOMOTIL) 2.5-0.025 MG tablet Take 2 tablets by mouth 4 (four) times daily. (Patient taking differently: Take 4 tablets by mouth 4 (four) times daily. ) 30 tablet 0   famotidine (PEPCID) 20 MG tablet Take 1 tablet (20 mg total) by mouth 2 (two) times daily as needed for heartburn or indigestion.     feeding supplement, ENSURE ENLIVE, (ENSURE ENLIVE) LIQD Take 237 mLs by mouth 2 (two) times daily between meals. (Patient not taking: Reported on 12/19/2018) 237 mL 12   hydrocortisone (CORTEF) 10 MG tablet Take 1 tablet (10 mg total) by mouth 2 (two) times daily. 180 tablet 0   hyoscyamine (LEVBID) 0.375 MG 12 hr tablet Take 1 tablet (0.375 mg total) by mouth 2 (two) times daily. 60 tablet 0   Multiple Vitamins-Minerals (MULTIVITAMIN WITH MINERALS) tablet Take 1 tablet by mouth daily. 30 tablet 0   ondansetron (ZOFRAN ODT) 4 MG disintegrating tablet Take 1 tablet (4 mg total) by mouth every 8 (eight) hours as needed for nausea or vomiting. 20 tablet 0   ondansetron (ZOFRAN) 4 MG tablet Take  1 tablet (4 mg total) by mouth every 6 (six) hours as needed for nausea. (Patient not taking: Reported on 12/19/2018) 20 tablet 0   ondansetron (ZOFRAN) 8 MG tablet Take 1 tablet (8 mg total) by mouth every 8 (eight) hours as needed for nausea or vomiting. 30 tablet 1   Oxycodone HCl 10 MG TABS Take 1 tablet (10 mg total) by mouth every 8 (eight) hours as needed. 60 tablet 0   pantoprazole (PROTONIX) 40 MG tablet Take 1 tablet (40 mg total) by mouth daily. 90 tablet 0   potassium chloride SA (K-DUR,KLOR-CON) 20 MEQ tablet Take 1 tablet (20 mEq total) by mouth daily. for 4 days. 30 tablet 0   promethazine (PHENERGAN) 25 MG tablet Take 1 tablet (25 mg total) by mouth every 8 (eight) hours as needed for nausea or vomiting. (Patient not taking: Reported on 12/19/2018) 30 tablet 0   rivaroxaban (XARELTO) 20 MG TABS tablet Take 20  mg by mouth daily with supper.     VENTOLIN HFA 108 (90 Base) MCG/ACT inhaler Inhale 1-2 puffs into the lungs every 4 (four) hours as needed for shortness of breath.   0   VIBERZI 100 MG TABS TAKE 1 TABLET BY MOUTH TWICE DAILY WITH  A  MEAL 60 tablet 0   No current facility-administered medications for this visit.    Facility-Administered Medications Ordered in Other Visits  Medication Dose Route Frequency Provider Last Rate Last Dose   heparin lock flush 100 unit/mL  500 Units Intravenous Once Corcoran, Melissa C, MD       magnesium sulfate IVPB 4 g 100 mL  4 g Intravenous Once Karen Kitchens, NP 50 mL/hr at 12/26/18 1159 4 g at 12/26/18 1159   sodium chloride 0.9 % 1,000 mL with potassium chloride 20 mEq infusion   Intravenous Once Karen Kitchens, NP        VITAL SIGNS: There were no vitals taken for this visit. There were no vitals filed for this visit.  Estimated body mass index is 24.81 kg/m as calculated from the following:   Height as of 12/25/18: 5' 9"  (1.753 m).   Weight as of 12/25/18: 168 lb (76.2 kg).  LABS: CBC:    Component Value Date/Time   WBC 4.9 12/26/2018 1034   HGB 9.8 (L) 12/26/2018 1034   HCT 29.4 (L) 12/26/2018 1034   PLT 94 (L) 12/26/2018 1034   MCV 104.6 (H) 12/26/2018 1034   NEUTROABS 3.8 12/26/2018 1034   LYMPHSABS 0.8 12/26/2018 1034   MONOABS 0.2 12/26/2018 1034   EOSABS 0.0 12/26/2018 1034   BASOSABS 0.0 12/26/2018 1034   Comprehensive Metabolic Panel:    Component Value Date/Time   NA 136 12/26/2018 1034   K 3.9 12/26/2018 1034   CL 105 12/26/2018 1034   CO2 24 12/26/2018 1034   BUN 11 12/26/2018 1034   CREATININE 0.76 12/26/2018 1034   GLUCOSE 96 12/26/2018 1034   CALCIUM 8.6 (L) 12/26/2018 1034   AST 31 12/26/2018 1034   ALT 47 (H) 12/26/2018 1034   ALKPHOS 186 (H) 12/26/2018 1034   BILITOT 0.5 12/26/2018 1034   PROT 7.8 12/26/2018 1034   ALBUMIN 3.8 12/26/2018 1034    RADIOGRAPHIC STUDIES: Dg Abd 1 View  Result Date:  11/26/2018 CLINICAL DATA:  53 year old female. Evaluate for retained capsule. Subsequent encounter. EXAM: ABDOMEN - 1 VIEW COMPARISON:  11/25/2018. FINDINGS: Radiopaque foreign body right lower quadrant of the abdomen consistent with bowel camera. This has shifted position from  the prior examination now projecting at the level of the cecum/terminal ileum level. Post cholecystectomy. Decreased amount of stool compared to yesterday's exam. Scoliosis lumbar spine with superimposed degenerative changes. IMPRESSION: Radiopaque foreign body right lower quadrant of the abdomen consistent with bowel camera. This has shifted position from the prior examination now projecting at the level of the cecum/terminal ileum level. Electronically Signed   By: Genia Del M.D.   On: 11/26/2018 13:00   Ct Abdomen Pelvis W Contrast  Result Date: 11/26/2018 CLINICAL DATA:  Lower GI bleeding. EXAM: CT ABDOMEN AND PELVIS WITH CONTRAST TECHNIQUE: Multidetector CT imaging of the abdomen and pelvis was performed using the standard protocol following bolus administration of intravenous contrast. CONTRAST:  174m OMNIPAQUE IOHEXOL 300 MG/ML  SOLN COMPARISON:  KUB from earlier today. Chest CT 04/15/2018. Abdomen and pelvis CT 10/17/2018 FINDINGS: Lower chest: Numerous bilateral pulmonary nodules are identified and similar to multiple prior studies. Index 11 mm nodule identified right lower lobe on 03/04. Index nodule left lower lobe on 02/04 measures 7 mm. No pleural effusion. Hepatobiliary: No suspicious focal abnormality within the liver parenchyma. Gallbladder surgically absent. Trace intrahepatic biliary duct dilatation evident. Extrahepatic common duct measures 13 mm diameter. Common bile duct in the head of the pancreas is 9 mm diameter. Pancreas: No focal mass lesion. No dilatation of the main duct. No intraparenchymal cyst. No peripancreatic edema. Spleen: No splenomegaly. No focal mass lesion. Adrenals/Urinary Tract: No adrenal  nodule or mass. Tiny nonobstructing renal stones noted bilaterally (coronal image 53/5 right kidney and coronal image 60/5 left kidney). No evidence for hydroureter. The urinary bladder appears normal for the degree of distention. Stomach/Bowel: Stomach is unremarkable. No gastric wall thickening. No evidence of outlet obstruction. Duodenum is normally positioned as is the ligament of Treitz. No small bowel wall thickening. No small bowel dilatation. Patient appears to be status post right colectomy. Capsule endoscope is identified in the region of the ileocolic anastomosis. No bowel dilatation proximal to the level of the capsule. Wall thickening noted in the sigmoid colon with the similar appearance of the stranding around the sigmoid colon. Vascular/Lymphatic: There is abdominal aortic atherosclerosis without aneurysm. There is no gastrohepatic or hepatoduodenal ligament lymphadenopathy. No intraperitoneal or retroperitoneal lymphadenopathy. Small retroperitoneal lymph nodes are stable. No pelvic sidewall lymphadenopathy. Reproductive: Stable appearance of the uterus and cervix. Other: No substantial intraperitoneal free fluid Musculoskeletal: Edema noted in the pelvic floor, as before. No worrisome lytic or sclerotic osseous abnormality. IMPRESSION: 1. Status post right hemicolectomy. Capsule endoscope noted in the region of the ileocolic anastomosis. Difficult to definitively localize the capsule due to streak artifact from the metallic components, but it does appear distal to the anastomosis. No evidence for small bowel obstruction. 2. Persistent mild wall thickening in the sigmoid colon with edema/stranding in the surrounding fat in soft tissues of the pelvic floor. Imaging features likely treatment related in this patient with a history of cervical cancer. 3. Stable intra and extrahepatic biliary duct dilatation in this patient status post cholecystectomy. 4. Stable appearance of numerous bilateral pulmonary  nodules consistent with metastatic disease. 5.  Aortic Atherosclerois (ICD10-170.0) Electronically Signed   By: EMisty StanleyM.D.   On: 11/26/2018 14:08   Dg Abdomen Acute W/chest  Result Date: 12/26/2018 CLINICAL DATA:  Chest pain starting in our ago. Nausea, vomiting, and diarrhea. EXAM: DG ABDOMEN ACUTE W/ 1V CHEST COMPARISON:  Abdomen 11/26/2018 FINDINGS: Power port type central venous catheter with tip over the low SVC region. Normal heart size  and pulmonary vascularity. No focal airspace disease or consolidation in the lungs. No blunting of costophrenic angles. No pneumothorax. Mediastinal contours appear intact. Degenerative changes in the spine and shoulders. Scattered gas and stool throughout the colon. No small or large bowel distention. No free intra-abdominal air. No abnormal air-fluid levels. Surgical clips in the right upper quadrant and right mid abdomen. Calcified phleboliths in the pelvis. No radiopaque stones. Degenerative changes in the spine. Soft tissue contours appear intact. IMPRESSION: No evidence of active pulmonary disease. Normal nonobstructive bowel gas pattern. Electronically Signed   By: Lucienne Capers M.D.   On: 12/26/2018 00:12   Ct Angio Abd/pel W And/or Wo Contrast  Result Date: 12/16/2018 CLINICAL DATA:  53 year old with abdominal pain. Abdominal pain is worse with bowel movements. History of cervical cancer. EXAM: CT ANGIOGRAPHY ABDOMEN AND PELVIS WITH CONTRAST AND WITHOUT CONTRAST TECHNIQUE: Multidetector CT imaging of the abdomen and pelvis was performed using the standard protocol during bolus administration of intravenous contrast. Multiplanar reconstructed images and MIPs were obtained and reviewed to evaluate the vascular anatomy. CONTRAST:  15m OMNIPAQUE IOHEXOL 350 MG/ML SOLN COMPARISON:  CT abdomen pelvis 11/26/2018 FINDINGS: VASCULAR Aorta: Normal caliber of the abdominal aorta with atherosclerotic disease. Negative for an aortic dissection. Celiac: Patent  without evidence of aneurysm, dissection, vasculitis or significant stenosis. Mild atherosclerotic disease. SMA: Patent without evidence of aneurysm, dissection, vasculitis or significant stenosis. Renals: Both renal arteries are patent without evidence of aneurysm, dissection, vasculitis, fibromuscular dysplasia or significant stenosis. IMA: IMA is patent. Inflow: Patent without evidence of aneurysm, dissection, vasculitis or significant stenosis. Proximal Outflow: Proximal femoral arteries are patent bilaterally. Veins: Portal venous system is patent. Normal appearance of the IVC and renal veins. Iliac veins are patent. Review of the MIP images confirms the above findings. NON-VASCULAR Lower chest: Again noted are innumerable pulmonary nodules compatible with metastatic disease. Many of the lesions have central cavitation. No pleural effusions. Hepatobiliary: Cholecystectomy. Chronic extrahepatic biliary dilatation measuring up to 1.4 cm and minimally changed. Main portal venous system is patent. Mild intrahepatic biliary dilatation. No suspicious liver lesions. Pancreas: Unremarkable. No pancreatic ductal dilatation or surrounding inflammatory changes. Spleen: Normal in size without focal abnormality. Adrenals/Urinary Tract: Normal adrenal glands. Normal appearance of both kidneys without hydronephrosis. Difficult to exclude mild bladder wall thickening but minimally changed from the previous examination. Stomach/Bowel: Normal appearance of the stomach and duodenum. Right hemicolectomy. Mucosal enhancement and wall thickening involving the rectum and sigmoid colon. There is no significant small bowel wall thickening. Negative for a bowel obstruction. Lymphatic: No lymph node enlargement in the abdomen or pelvis. Reproductive: Stable appearance of the uterus and adnexal structures. Other: Stable stranding or edema in the pelvic region, including the presacral region. These findings are similar to the previous  examination. Negative for free air. Musculoskeletal: Significant disc space narrowing and endplate disease at LZ6-X0and L4-L5. Disc space narrowing at L1-L2. IMPRESSION: VASCULAR 1. Mild atherosclerotic disease the aorta and visceral arteries. No significant visceral artery stenosis. Negative for an aortic aneurysm. 2. Venous structures are unremarkable. NON-VASCULAR 1. Mucosal enhancement and wall thickening in the rectum and sigmoid colon. Findings are similar to the previous examination and probably represent chronic inflammation related to cervical cancer treatment. Stable edema or stranding in the pelvis. 2. Right hemicolectomy. 3. Innumerable pulmonary nodules compatible with metastatic disease. 4. Stable biliary dilatation, status post cholecystectomy. Electronically Signed   By: AMarkus DaftM.D.   On: 12/16/2018 14:48    PERFORMANCE STATUS (ECOG) : 1 -  Symptomatic but completely ambulatory  REVIEW OF SYSTEMS: As noted above. Otherwise, a complete review of systems is negative.  PHYSICAL EXAM: General: NAD, frail appearing, thin Cardiovascular: regular rate and rhythm Pulmonary: clear ant fields Abdomen: soft, nontender, + bowel sounds  GU: no suprapubic tenderness Extremities: no edema Skin: no rashes Neurological: Weakness but otherwise nonfocal  IMPRESSION: Patient was an add-on today to my schedule at patient's request.  She was seen yesterday in the ER for abdominal pain.  Patient says she acutely developed nausea and vomiting with diarrhea yesterday.  Patient was felt likely to have gastritis.  Patient clinically improved in the ER with use of pain medications.  Patient says symptoms have improved today.  Being nausea, vomiting, or diarrhea.  Patient says the oxycodone has helped her pain but that she is having an dose failure with an every 8 hour schedule.  We will increase oxycodone to every 6 hours.  Would consider initiation of a long-acting opioid if  needed.   PLAN: -Continue current scope of treatment -Increase oxycodone 10 mg every 6 hours as needed for pain  -UDS ordered pending 3/18 -Will plan to complete MOST form when next seen -RTC 1 to 2 weeks   Patient expressed understanding and was in agreement with this plan. She also understands that She can call clinic at any time with any questions, concerns, or complaints.     Time Total: 15 minutes  Visit consisted of counseling and education dealing with the complex and emotionally intense issues of symptom management and palliative care in the setting of serious and potentially life-threatening illness.Greater than 50%  of this time was spent counseling and coordinating care related to the above assessment and plan.  Signed by: Altha Harm, PhD, NP-C [phone] (Work Cell)

## 2018-12-26 NOTE — Discharge Instructions (Addendum)

## 2019-01-01 ENCOUNTER — Encounter: Payer: Self-pay | Admitting: Internal Medicine

## 2019-01-01 ENCOUNTER — Inpatient Hospital Stay: Payer: Medicaid Other

## 2019-01-01 ENCOUNTER — Inpatient Hospital Stay (HOSPITAL_BASED_OUTPATIENT_CLINIC_OR_DEPARTMENT_OTHER): Payer: Medicaid Other | Admitting: Internal Medicine

## 2019-01-01 ENCOUNTER — Other Ambulatory Visit: Payer: Self-pay

## 2019-01-01 VITALS — BP 108/73 | HR 90 | Temp 97.8°F | Resp 20 | Ht 69.0 in | Wt 168.0 lb

## 2019-01-01 DIAGNOSIS — C538 Malignant neoplasm of overlapping sites of cervix uteri: Secondary | ICD-10-CM

## 2019-01-01 DIAGNOSIS — Z7901 Long term (current) use of anticoagulants: Secondary | ICD-10-CM

## 2019-01-01 DIAGNOSIS — K529 Noninfective gastroenteritis and colitis, unspecified: Secondary | ICD-10-CM

## 2019-01-01 DIAGNOSIS — C7800 Secondary malignant neoplasm of unspecified lung: Secondary | ICD-10-CM

## 2019-01-01 DIAGNOSIS — Z87891 Personal history of nicotine dependence: Secondary | ICD-10-CM

## 2019-01-01 DIAGNOSIS — Z86718 Personal history of other venous thrombosis and embolism: Secondary | ICD-10-CM

## 2019-01-01 DIAGNOSIS — D6851 Activated protein C resistance: Secondary | ICD-10-CM | POA: Diagnosis not present

## 2019-01-01 DIAGNOSIS — Z923 Personal history of irradiation: Secondary | ICD-10-CM

## 2019-01-01 DIAGNOSIS — Z7952 Long term (current) use of systemic steroids: Secondary | ICD-10-CM

## 2019-01-01 DIAGNOSIS — B182 Chronic viral hepatitis C: Secondary | ICD-10-CM

## 2019-01-01 DIAGNOSIS — Z86711 Personal history of pulmonary embolism: Secondary | ICD-10-CM

## 2019-01-01 DIAGNOSIS — G893 Neoplasm related pain (acute) (chronic): Secondary | ICD-10-CM

## 2019-01-01 DIAGNOSIS — Z79899 Other long term (current) drug therapy: Secondary | ICD-10-CM

## 2019-01-01 DIAGNOSIS — Z5112 Encounter for antineoplastic immunotherapy: Secondary | ICD-10-CM | POA: Diagnosis not present

## 2019-01-01 DIAGNOSIS — Z95828 Presence of other vascular implants and grafts: Secondary | ICD-10-CM

## 2019-01-01 LAB — COMPREHENSIVE METABOLIC PANEL
ALK PHOS: 167 U/L — AB (ref 38–126)
ALT: 39 U/L (ref 0–44)
AST: 32 U/L (ref 15–41)
Albumin: 3.7 g/dL (ref 3.5–5.0)
Anion gap: 6 (ref 5–15)
BUN: 17 mg/dL (ref 6–20)
CALCIUM: 8.9 mg/dL (ref 8.9–10.3)
CO2: 25 mmol/L (ref 22–32)
Chloride: 104 mmol/L (ref 98–111)
Creatinine, Ser: 0.8 mg/dL (ref 0.44–1.00)
GFR calc Af Amer: >60 mL/min (ref >60)
GFR calc non Af Amer: >60 mL/min (ref >60)
Glucose, Bld: 109 mg/dL — ABNORMAL HIGH (ref 70–99)
Potassium: 3.9 mmol/L (ref 3.5–5.1)
Sodium: 135 mmol/L (ref 135–145)
Total Bilirubin: 0.4 mg/dL (ref 0.3–1.2)
Total Protein: 7.7 g/dL (ref 6.5–8.1)

## 2019-01-01 LAB — CBC WITH DIFFERENTIAL/PLATELET
Abs Immature Granulocytes: 0.02 10*3/uL (ref 0.00–0.07)
Basophils Absolute: 0 10*3/uL (ref 0.0–0.1)
Basophils Relative: 0 %
Eosinophils Absolute: 0 10*3/uL (ref 0.0–0.5)
Eosinophils Relative: 0 %
HCT: 31.6 % — ABNORMAL LOW (ref 36.0–46.0)
Hemoglobin: 10.6 g/dL — ABNORMAL LOW (ref 12.0–15.0)
Immature Granulocytes: 1 %
Lymphocytes Relative: 15 %
Lymphs Abs: 0.6 10*3/uL — ABNORMAL LOW (ref 0.7–4.0)
MCH: 35.5 pg — AB (ref 26.0–34.0)
MCHC: 33.5 g/dL (ref 30.0–36.0)
MCV: 105.7 fL — ABNORMAL HIGH (ref 80.0–100.0)
MONO ABS: 0.3 10*3/uL (ref 0.1–1.0)
Monocytes Relative: 6 %
Neutro Abs: 3.4 10*3/uL (ref 1.7–7.7)
Neutrophils Relative %: 78 %
Platelets: 141 10*3/uL — ABNORMAL LOW (ref 150–400)
RBC: 2.99 MIL/uL — ABNORMAL LOW (ref 3.87–5.11)
RDW: 13.4 % (ref 11.5–15.5)
WBC: 4.3 10*3/uL (ref 4.0–10.5)
nRBC: 0 % (ref 0.0–0.2)

## 2019-01-01 LAB — MAGNESIUM: Magnesium: 1.5 mg/dL — ABNORMAL LOW (ref 1.7–2.4)

## 2019-01-01 MED ORDER — SODIUM CHLORIDE 0.9 % IV SOLN
20.0000 mg | Freq: Once | INTRAVENOUS | Status: DC
  Filled 2019-01-01: qty 2

## 2019-01-01 MED ORDER — POTASSIUM CHLORIDE CRYS ER 20 MEQ PO TBCR: 20.0000 meq | EXTENDED_RELEASE_TABLET | Freq: Every day | ORAL | 3 refills | Status: DC

## 2019-01-01 MED ORDER — MAGNESIUM SULFATE 4 GM/100ML IV SOLN
4.0000 g | Freq: Once | INTRAVENOUS | Status: AC
  Administered 2019-01-01: 4 g via INTRAVENOUS
  Filled 2019-01-01: qty 100

## 2019-01-01 MED ORDER — FAMOTIDINE IN NACL 20-0.9 MG/50ML-% IV SOLN: 20.0000 mg | Freq: Once | INTRAVENOUS | Status: DC

## 2019-01-01 MED ORDER — SODIUM CHLORIDE 0.9 % IV SOLN
155.0000 mg/m2 | Freq: Once | INTRAVENOUS | Status: AC
  Administered 2019-01-01: 294 mg via INTRAVENOUS
  Filled 2019-01-01: qty 49

## 2019-01-01 MED ORDER — SODIUM CHLORIDE 0.9% FLUSH
10.0000 mL | Freq: Once | INTRAVENOUS | Status: AC
  Administered 2019-01-01: 10 mL via INTRAVENOUS
  Filled 2019-01-01: qty 10

## 2019-01-01 MED ORDER — SODIUM CHLORIDE 0.9 % IV SOLN
Freq: Once | INTRAVENOUS | Status: AC
  Administered 2019-01-01: 12:00:00 via INTRAVENOUS
  Filled 2019-01-01: qty 250

## 2019-01-01 MED ORDER — DIPHENHYDRAMINE HCL 50 MG/ML IJ SOLN
50.0000 mg | Freq: Once | INTRAMUSCULAR | Status: AC
  Administered 2019-01-01: 50 mg via INTRAVENOUS
  Filled 2019-01-01: qty 1

## 2019-01-01 MED ORDER — FAMOTIDINE IN NACL 20-0.9 MG/50ML-% IV SOLN
20.0000 mg | Freq: Once | INTRAVENOUS | Status: AC
  Administered 2019-01-01: 20 mg via INTRAVENOUS
  Filled 2019-01-01: qty 50

## 2019-01-01 MED ORDER — HEPARIN SOD (PORK) LOCK FLUSH 100 UNIT/ML IV SOLN
500.0000 [IU] | Freq: Once | INTRAVENOUS | Status: AC | PRN
  Administered 2019-01-01: 500 [IU]
  Filled 2019-01-01: qty 5

## 2019-01-01 MED ORDER — SODIUM CHLORIDE 0.9 % IV SOLN
500.0000 mg | Freq: Once | INTRAVENOUS | Status: AC
  Administered 2019-01-01: 500 mg via INTRAVENOUS
  Filled 2019-01-01: qty 50

## 2019-01-01 MED ORDER — PROMETHAZINE HCL 25 MG PO TABS: 25.0000 mg | ORAL_TABLET | Freq: Three times a day (TID) | ORAL | 3 refills | Status: DC | PRN

## 2019-01-01 MED ORDER — PALONOSETRON HCL INJECTION 0.25 MG/5ML
0.2500 mg | Freq: Once | INTRAVENOUS | Status: AC
  Administered 2019-01-01: 0.25 mg via INTRAVENOUS
  Filled 2019-01-01: qty 5

## 2019-01-01 MED ORDER — SODIUM CHLORIDE 0.9 % IV SOLN
Freq: Once | INTRAVENOUS | Status: AC
  Administered 2019-01-01: 11:00:00 via INTRAVENOUS
  Filled 2019-01-01: qty 250

## 2019-01-01 MED ORDER — SODIUM CHLORIDE 0.9 % IV SOLN
20.0000 mg | Freq: Once | INTRAVENOUS | Status: AC
  Administered 2019-01-01: 20 mg via INTRAVENOUS
  Filled 2019-01-01: qty 2

## 2019-01-01 NOTE — Assessment & Plan Note (Addendum)
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  CT scan chest-February 2020-bilateral up to a centimeter sized lung nodules.  Stable  #Proceed with carbotaxol cycle #7 today. Labs today reviewed;  acceptable for treatment today.   #Hypomagnesemia-secondary platinum chemotherapy-remains in 1.5 proceed with 4 g magnesium today.  Stable  #Chronic right lower quadrant abdominal pain/scar tissue versus others-clinically stable continue oxycodone.  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  Followed by GI stable.  #History of DVT/PE-continue Xarelto.  Stable  # adrenal insuffiency- on maintenance steroids stable.  # Educated the patient regarding novel coronavirus-modes of transmission/risks; and measures to avoid infection.   #Also had a discussion with the patient and mother regarding overall poor prognosis the median survival around 12 months or so.  Encouraged palliative care reevaluation at next visit.  # 40 minutes face-to-face with the patient discussing the above plan of care; more than 50% of time spent on prognosis/ natural history; counseling and coordination.   # DISPOSITION: # proceed with carbo-taxol today; udenyca tomorrow #  magnseium IV 4 gm/ 2 hourstomorrow # follow up in 2 weeks/ no chemo- cbc/cmp/mag- magnesium 4gm. 2 hours-Dr.B  # I reviewed the blood work- with the patient in detail; also reviewed the imaging independently [as summarized above]; and with the patient in detail.

## 2019-01-01 NOTE — Progress Notes (Signed)
[facility] NOTE  Patient Care Team: Lada, Satira Anis, MD as PCP - General (Family Medicine) Mellody Drown, MD as Consulting Physician (Obstetrics and Gynecology) Lenor Coffin, MD as Attending Physician (Gynecology) Lequita Asal, MD as Medical Oncologist (Hematology and Oncology) [name], MD as Consulting Physician (Gastroenterology) Michael Boston, MD as Consulting Physician (General Surgery) Lovell Sheehan, MD as Consulting Physician (Orthopedic Surgery) Clent Jacks, RN as Registered Nurse  CHIEF COMPLAINTS/PURPOSE OF CONSULTATION: Cervical cancer  #  Oncology History   # dec 2017- CERVICAL ADENO CA [[place]]; Stage IB [Dr. Christene Slates at [facility] in [place], [place];No surgery- Chemo-RT;  # 2018-sep - lung nodules [in [place]]  # AUG 20th, 2019-#1 Carbo-Taxol s/p 6 cycles- [No avastin sec to blood clots]: Feb 2020-bilateral lung nodules with 1 cm in size.  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  She received treatment from 11/2016-05/2017.  01/2017 cisplatin x 2 and carboplatin x 1 (01/29/2017) due to ARF and XRT.  XRT was followed by T & O on 02/01/2017 and T & N 02/10/2017 and 02/20/2017.  Course was complicated by 80 pound weight loss, nausea, vomiting, electrolyte wasting (potassium and magnesium).  She describes that.  Is been sick constantly requiring at least 20 hospitalizations.  Follow-up CT chest and PET on 06/2017. Per patient, 'radiation worked' and no disease in the abdomen.  At that time she was noted to have lung nodules that were growing and follow-up imaging was scheduled for  10/2017.  She was admitted to hospital in [place] for small bowel obstruction which was managed conservatively and she was home for a week prior to traveling to [place] for Thanksgiving holiday where she has family.  She presented to ER in [place] on 08/2017 with nausea, vomiting, and lower abdominal pain.  Symptoms did not respond to conservative treatment.  CT on 08/26/2017 revealed small bowel obstruction with transition in the pelvis just superior to the uterus rather was a long segment of distal ileum with fatty wall thickening compatible with chronic inflammation and/or radiation enteritis. Imaging showed numerous pulmonary nodules consistent with metastatic disease. She underwent laparotomy and right ileocolectomy on 08/31/2017 at [facility].  Surgical findings revealed a thickened, matted, and scarred piece of distal small bowel close to the ileocecal valve.  She was discharged on 09/05/2017.  Pain markedly increased in intensity and imaging was performed on 09/11/2017 which revealed: Debris within the anterior abdominal wall incision concerning for infection versus packing material, s/p post ileo-colectomy with expected postoperative changes, mild colonic ileus, numerous pulmonary nodules highly concerning for metastatic disease, punctate nonobstructing nephrolithiasis.  Staples were removed and one was packed.  She was started on doxycycline.  Abdominal and pelvic CT without contrast on 09/11/2017 revealed debris within anterior abdominal wall incision concerning for infection, versus packing material.She is s/pileocolectomy with expected postoperative changes and mild colonic ileus. There were numerous pulmonary nodules highly concerning for metastatic diseaseand punctate nonobstructing nephrolithiasis. She was readmitted on 09/12/2017. She describes the onset of lower abdominal pain on 09/09/2017. Pain markedly increased in intensity on 09/11/2017.  Staples were removed  and the wound packed. She was started on doxycycline.  CT on 09/13/2017 showed postsurgical changes from ileocecectomy with primary  ileocolic anastomosis without evidence of abscess or leak, edema small bowel loops of distal ileum, gas within ventral midline surgical wound corresponding to wound infection versus packing material, small infarct at the inferior pole of left kidney, right uterine infarct.  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  PET scan was ordered to evaluate enlarging lung nodules with concern for recurrent cervical cancer but scan was delayed due to insurance and need to be performed in [place].  Presented to ER on 12/03/2017 for abdominal pain and emesis.  Imaging concerning for worsening possible uterine infarct and she was admitted to hospital.  Pelvic MRI was unremarkable.  Remote scarring type changes of uterus thought to be possibly related to radiation.  She was discharged on 12/08/2017.  Underwent endoscopy and colonoscopy on 12/20/2017.    On 02/22/2018 she underwent laser ablation of condylomata around the anus and vagina under anesthesia with Dr. Johney Maine.   04/15/2018: Chest, abdomen, and pelvis CTrevealed innumerable (>100) cavitary nodules scattered in the lungs, moderately enlarging compared to the 11/08/2017 PET-CT, suspicious for metastatic disease. One index node in the RLL measures 1.0 x 1.1 cm (previously 0.6 x 0.6 cm). There were no new nodules. There was an ill-defined wall thickening in the rectosigmoid with surrounding stranding along fascia planes, probably sequela from prior radiation therapy. There was multilevel lumbar impingement due to spondylosis and degenerative disc disease. There was heterogeneous enhancement in the uterus (some possibly from prior radiation therapy).  04/23/2018:PET scan revealednumerous scattered solid and cavitary nodules in the lungs stable increased in size compared to the prior  PET-CT from 11/08/2017.Largest nodule was 1.1 cm in the LUL (SUV 1.9).These demonstratedlow-grade metabolic activity up to a maximum SUV of about 2.3, increased from 11/08/2017.   Case was discussed at tumor board on 04/25/2018. Consensus to pursue CT-guided biopsy (05/06/18) which revealed: Metastatic adenocarcinoma, morphologically consistent with cervical adenocarcinoma.  She has history of chronic hepatitis C which is managed by GI.  Hepatitis C genotype is 2a/2c.  She receives B12 injections for history of B12 deficiency.  On 04/24/2018 she underwent left total knee replacement with Dr. [name]. --------------------------------------------------------   DIAGNOSIS: Cervical adenocarcinoma  STAGE: IV        ;GOALS: Palliative  CURRENT/MOST RECENT THERAPY : Carbotaxol      Malignant neoplasm of overlapping sites of cervix [facility])   This is my first interaction with the patient as patient's primary oncologist has been [place]. I reviewed the patient's prior charts/pertinent labs/imaging in detail; findings are summarized above.    HISTORY OF PRESENTING ILLNESS:  Joanna Hall 53 y.o.  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   Patient has recently evaluated in the emergency room for abdominal pain nausea vomiting.  CT scan unrevealing-except for multiple bilateral lung nodules up to 1 cm in size.  Today patient feels improved.  Abdominal pain under better control.  No rectal bleeding.  No nausea vomiting.  Denies any tingling or numbness.  Complains of fatigue.   Review of Systems  Constitutional: Positive for malaise/fatigue. Negative for chills, diaphoresis, fever and weight loss.  HENT: Negative for nosebleeds and sore throat.   Eyes: Negative for double vision.  Respiratory: Negative for cough, hemoptysis, sputum production, shortness of breath and wheezing.   Cardiovascular: Negative  for chest pain, palpitations, orthopnea and leg swelling.  Gastrointestinal: Positive for abdominal pain. Negative for blood  in stool, constipation, diarrhea, heartburn, melena, nausea and vomiting.  Genitourinary: Negative for dysuria, frequency and urgency.  Musculoskeletal: Negative for back pain and joint pain.  Skin: Negative.  Negative for itching and rash.  Neurological: Negative for dizziness, tingling, focal weakness, weakness and headaches.  Endo/Heme/Allergies: Does not bruise/bleed easily.  Psychiatric/Behavioral: Negative for depression. The patient is not nervous/anxious and does not have insomnia.      MEDICAL HISTORY:  Past Medical History:  Diagnosis Date  . Abdominal pain 06/10/2018  . Abnormal cervical Papanicolaou smear 09/18/2017  . Anxiety   . Aortic atherosclerosis ([place])   . Arthritis    neck and knees  . 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 Blood transfusion without reported diagnosis   . Cervical cancer ([place]) 09/2016   mets lung  . Chronic anal fissure   . Chronic diarrhea   . Dyspnea   . Erosive gastropathy 09/18/2017  . Factor V Leiden mutation ([place])   . Fecal incontinence   . Genital warts   . GERD (gastroesophageal reflux disease)   . Heart murmur   . Hematochezia   . Hemorrhoids   . 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 Hepatitis, chronic ([place]) 05/05/2017  . History of cancer chemotherapy    completed 06/2017  . History of Clostridium difficile infection    while undergoing chemo.  Negative test 10/2017  . Ileocolic anastomotic leak   . 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 Intestinal infection due to Clostridium difficile 09/18/2017  . Macrocytic anemia with vitamin B12 deficiency   . Multiple gastric ulcers   . Nausea vomiting and diarrhea   . Pancolitis ([place]) 07/27/2018  . Perianal condylomata   . Pneumonia    History of  . Pulmonary nodules   . Rectal bleeding   . Small  bowel obstruction ([place]) 08/2017  . Stiff neck    limited right turn  . Vitamin D deficiency     SURGICAL HISTORY: Past Surgical History:  Procedure Laterality Date  . CHOLECYSTECTOMY    . COLON SURGERY  08/2017   resection  . COLONOSCOPY WITH PROPOFOL N/A 12/20/2017   Procedure: COLONOSCOPY WITH PROPOFOL;  Surgeon: [name], MD;  Location: [facility] ENDOSCOPY;  Service: Gastroenterology;  Laterality: N/A;  . COLONOSCOPY WITH PROPOFOL N/A 07/30/2018   Procedure: COLONOSCOPY WITH PROPOFOL;  Surgeon: [name], MD;  Location: [facility] ENDOSCOPY;  Service: Gastroenterology;  Laterality: N/A;  . COLONOSCOPY WITH PROPOFOL N/A 10/10/2018   Procedure: COLONOSCOPY WITH PROPOFOL;  Surgeon: Lucilla Lame, MD;  Location[facility] ENDOSCOPY;  Service: Endoscopy;  Laterality: N/A;  . DIAGNOSTIC LAPAROSCOPY    . ESOPHAGOGASTRODUODENOSCOPY (EGD) WITH PROPOFOL N/A 12/20/2017   Procedure: ESOPHAGOGASTRODUODENOSCOPY (EGD) WITH PROPOFOL;  Surgeon: [name], MD;  Location: [facility];  Service: Gastroenterology;  Laterality: N/A;  . ESOPHAGOGASTRODUODENOSCOPY (EGD) WITH PROPOFOL  07/30/2018   Procedure: ESOPHAGOGASTRODUODENOSCOPY (EGD) WITH PROPOFOL;  Surgeon: [name], MD;  Location: [facility] ENDOSCOPY;  Service: Gastroenterology;;  . Otho Darner SIGMOIDOSCOPY N/A 11/21/2018   Procedure: FLEXIBLE SIGMOIDOSCOPY;  Surgeon: [name], MD;  Location: [facility] ENDOSCOPY;  Service: Gastroenterology;  Laterality: N/A;  . LAPAROTOMY N/A 08/31/2017   Procedure: EXPLORATORY LAPAROTOMY for SBO, ileocolectomy, removal of piece of uterine wall;  Surgeon: [name], MD;  Location: [facility] ORS;  Service: General;  Laterality: N/A;  . [place] N/A 02/22/2018  Procedure: LASER ABLATION/REMOVAL OF CONDOLAMATA AROUND ANUS AND VAGINA;  Surgeon: Michael Boston, MD;  Location: [facility];  Service: General;  Laterality: N/A;  . OOPHORECTOMY    . PORTA CATH INSERTION  N/A 05/13/2018   Procedure: PORTA CATH INSERTION;  Surgeon: Katha Cabal, MD;  Location: [facility] CV LAB;  Service: Cardiovascular;  Laterality: N/A;  . SMALL INTESTINE SURGERY    . TANDEM RING INSERTION     x3  . THORACOTOMY    . TOTAL KNEE ARTHROPLASTY Left 04/24/2018   Procedure: TOTAL KNEE ARTHROPLASTY;  Surgeon: Lovell Sheehan, MD;  Location: [facility] ORS;  Service: Orthopedics;  Laterality: Left;    SOCIAL HISTORY: Social History   Socioeconomic History  . Marital status: Divorced    Spouse name: Not on file  . Number of children: Not on file  . Years of education: Not on file  . Highest education level: Not on file  Occupational History  . Not on file  Social Needs  . Financial resource strain: Not hard at all  . Food insecurity:    Worry: Never true    Inability: Never true  . Transportation needs:    Medical: No    Non-medical: No  Tobacco Use  . Smoking status: Former Smoker    Last attempt to quit: 10/16/2006    Years since quitting: 12.2  . Smokeless tobacco: Never Used  Substance and Sexual Activity  . Alcohol use: Not Currently    Frequency: Never    Comment: seldom  . Drug use: Yes    Types: Marijuana  . Sexual activity: Not Currently    Birth control/protection: Post-menopausal    Comment: Not Asked  Lifestyle  . Physical activity:    Days per week: Patient refused    Minutes per session: Patient refused  . Stress: Only a little  Relationships  . Social connections:    Talks on phone: Patient refused    Gets together: Patient refused    Attends religious service: Patient refused    Active member of club or organization: Patient refused    Attends meetings of clubs or organizations: Patient refused    Relationship status: Patient refused  . Intimate partner violence:    Fear of current or ex partner: No    Emotionally abused: No    Physically abused: No    Forced sexual activity: No  Other Topics Concern  . Not on file  Social History  Narrative  . Not on file    FAMILY HISTORY: Family History  Problem Relation Age of Onset  . Hypertension Father   . Diabetes Father   . Alcohol abuse Daughter   . Hypertension Maternal Grandmother   . Diabetes Maternal Grandmother   . Diabetes Paternal Grandmother   . Hypertension Paternal Grandmother     ALLERGIES:  is allergic to ketamine.  MEDICATIONS:  Current Outpatient Medications  Medication Sig Dispense Refill  . amitriptyline (ELAVIL) 75 MG tablet Take 1 tablet (75 mg total) by mouth at bedtime. 90 tablet 1  . budesonide (ENTOCORT EC) 3 MG 24 hr capsule Take 3 capsules (9 mg total) by mouth daily. 30 capsule 0  . Calcium Carb-Cholecalciferol (CALCIUM 500 +D) 500-400 MG-UNIT TABS Take 2 tablets by mouth daily.    . diphenoxylate-atropine (LOMOTIL) 2.5-0.025 MG tablet Take 2 tablets by mouth 4 (four) times daily. (Patient taking differently: Take 4 tablets by mouth 4 (four) times daily. ) 30 tablet 0  . famotidine (PEPCID) 20  MG tablet Take 1 tablet (20 mg total) by mouth 2 (two) times daily as needed for heartburn or indigestion.    . hydrocortisone (CORTEF) 10 MG tablet Take 1 tablet (10 mg total) by mouth 2 (two) times daily. 180 tablet 0  . hyoscyamine (LEVBID) 0.375 MG 12 hr tablet Take 1 tablet (0.375 mg total) by mouth 2 (two) times daily. 60 tablet 0  . Multiple Vitamins-Minerals (MULTIVITAMIN WITH MINERALS) tablet Take 1 tablet by mouth daily. 30 tablet 0  . ondansetron (ZOFRAN ODT) 4 MG disintegrating tablet Take 1 tablet (4 mg total) by mouth every 8 (eight) hours as needed for nausea or vomiting. 20 tablet 0  . ondansetron (ZOFRAN) 4 MG tablet Take 1 tablet (4 mg total) by mouth every 6 (six) hours as needed for nausea. (Patient not taking: Reported on 12/19/2018) 20 tablet 0  . ondansetron (ZOFRAN) 8 MG tablet Take 1 tablet (8 mg total) by mouth every 8 (eight) hours as needed for nausea or vomiting. 30 tablet 1  . Oxycodone HCl 10 MG TABS Take 1 tablet (10 mg  total) by mouth every 6 (six) hours as needed. 60 tablet 0  . pantoprazole (PROTONIX) 40 MG tablet Take 1 tablet (40 mg total) by mouth daily. 90 tablet 0  . potassium chloride SA (K-DUR,KLOR-CON) 20 MEQ tablet Take 1 tablet (20 mEq total) by mouth daily. 30 tablet 3  . promethazine (PHENERGAN) 25 MG tablet Take 1 tablet (25 mg total) by mouth every 8 (eight) hours as needed for nausea or vomiting. 30 tablet 3  . rivaroxaban (XARELTO) 20 MG TABS tablet Take 20 mg by mouth daily with supper.    . VENTOLIN HFA 108 (90 Base) MCG/ACT inhaler Inhale 1-2 puffs into the lungs every 4 (four) hours as needed for shortness of breath.   0  . VIBERZI 100 MG TABS TAKE 1 TABLET BY MOUTH TWICE DAILY WITH  A  MEAL 60 tablet 0   No current facility-administered medications for this visit.    Facility-Administered Medications Ordered in Other Visits  Medication Dose Route Frequency Provider Last Rate Last Dose  . CARBOplatin (PARAPLATIN) 500 mg in sodium chloride 0.9 % 250 mL chemo infusion  500 mg Intravenous Once Charlaine Dalton R, MD      . heparin lock flush 100 unit/mL  500 Units Intravenous Once Corcoran, Melissa C, MD      . heparin lock flush 100 unit/mL  500 Units Intracatheter Once PRN Charlaine Dalton R, MD      . magnesium sulfate IVPB 4 g 100 mL  4 g Intravenous Once Karen Kitchens, NP 50 mL/hr at 01/01/19 1225 4 g at 01/01/19 1225  . PACLitaxel (TAXOL) 294 mg in sodium chloride 0.9 % 250 mL chemo infusion (> 38m/m2)  155 mg/m2 (Treatment Plan Recorded) Intravenous Once BCammie Sickle MD 109 mL/hr at 01/01/19 1217    . sodium chloride 0.9 % 1,000 mL with potassium chloride 20 mEq infusion   Intravenous Once GHonor LohE, NP          .  PHYSICAL EXAMINATION: ECOG PERFORMANCE STATUS: 1 - Symptomatic but completely ambulatory  Vitals:   01/01/19 0918  BP: 108/73  Pulse: 90  Resp: 20  Temp: 97.8 F (36.6 C)   Filed Weights   01/01/19 0918  Weight: 168 lb (76.2 kg)     Physical Exam  Constitutional: She is oriented to person, place, and time and well-developed, well-nourished, and in no distress.  She  is accompanied by her mother.  She is walking herself.  HENT:  Head: Normocephalic and atraumatic.  Mouth/Throat: Oropharynx is clear and moist. No oropharyngeal exudate.  Eyes: Pupils are equal, round, and reactive to light.  Neck: Normal range of motion. Neck supple.  Cardiovascular: Normal rate and regular rhythm.  Pulmonary/Chest: Effort normal and breath sounds normal. No respiratory distress. She has no wheezes.  Abdominal: Soft. Bowel sounds are normal. She exhibits no distension and no mass. There is no abdominal tenderness. There is no rebound and no guarding.  Musculoskeletal: Normal range of motion.        General: No tenderness or edema.  Neurological: She is alert and oriented to person, place, and time.  Skin: Skin is warm.  Psychiatric: Affect normal.     LABORATORY DATA:  I have reviewed the data as listed Lab Results  Component Value Date   WBC 4.3 01/01/2019   HGB 10.6 (L) 01/01/2019   HCT 31.6 (L) 01/01/2019   MCV 105.7 (H) 01/01/2019   PLT 141 (L) 01/01/2019   Recent Labs    06/11/18 0510  07/08/18 1035  10/21/18 0845  12/25/18 2233 12/26/18 1034 01/01/19 0831  NA 135   < > 136   < > 134*   < > 137 136 135  K 3.9   < > 4.2   < > 3.9   < > 3.4* 3.9 3.9  CL 102   < > 107   < > 98   < > 104 105 104  CO2 27   < > 22   < > 24   < > 22 24 25   GLUCOSE 82   < > 119*   < > 111*   < > 161* 96 109*  BUN 11   < > 15   < > 17   < > 16 11 17   CREATININE 0.63   < > 0.71   < > 0.91   < > 0.87 0.76 0.80  CALCIUM 8.9   < > 8.7*   < > 9.6   < > 8.9 8.6* 8.9  GFRNONAA >60   < > >60   < > >60   < > >60 >60 >60  GFRAA >60   < > >60   < > >60   < > >60 >60 >60  PROT 7.0   < > 6.5   < > 8.6*   < > 7.6 7.8 7.7  ALBUMIN 3.5   < > 3.4*   < > 4.4   < > 3.8 3.8 3.7  AST 41   < > 26   < > 48*   < > 44* 31 32  ALT 51*   < > 33   < > 67*    < > 53* 47* 39  ALKPHOS 171*   < > 132*   < > 185*   < > 202* 186* 167*  BILITOT 0.7   < > 0.4   < > 1.4*   < > 0.7 0.5 0.4  BILIDIR 0.1  --  <0.1  --  0.3*  --   --   --   --   IBILI 0.6  --  NOT CALCULATED  --   --   --   --   --   --    < > = values in this interval not displayed.    RADIOGRAPHIC STUDIES: I have personally reviewed the radiological images as listed and agreed  with the findings in the report. Dg Abdomen Acute W/chest  Result Date: 12/26/2018 CLINICAL DATA:  Chest pain starting in our ago. Nausea, vomiting, and diarrhea. EXAM: DG ABDOMEN ACUTE W/ 1V CHEST COMPARISON:  Abdomen 11/26/2018 FINDINGS: Power port type central venous catheter with tip over the low SVC region. Normal heart size and pulmonary vascularity. No focal airspace disease or consolidation in the lungs. No blunting of costophrenic angles. No pneumothorax. Mediastinal contours appear intact. Degenerative changes in the spine and shoulders. Scattered gas and stool throughout the colon. No small or large bowel distention. No free intra-abdominal air. No abnormal air-fluid levels. Surgical clips in the right upper quadrant and right mid abdomen. Calcified phleboliths in the pelvis. No radiopaque stones. Degenerative changes in the spine. Soft tissue contours appear intact. IMPRESSION: No evidence of active pulmonary disease. Normal nonobstructive bowel gas pattern. Electronically Signed   By: Lucienne Capers M.D.   On: 12/26/2018 00:12   Ct Angio Abd/pel W And/or Wo Contrast  Result Date: 12/16/2018 CLINICAL DATA:  53 year old with abdominal pain. Abdominal pain is worse with bowel movements. History of cervical cancer. EXAM: CT ANGIOGRAPHY ABDOMEN AND PELVIS WITH CONTRAST AND WITHOUT CONTRAST TECHNIQUE: Multidetector CT imaging of the abdomen and pelvis was performed using the standard protocol during bolus administration of intravenous contrast. Multiplanar reconstructed images and MIPs were obtained and reviewed to  evaluate the vascular anatomy. CONTRAST:  146m OMNIPAQUE IOHEXOL 350 MG/ML SOLN COMPARISON:  CT abdomen pelvis 11/26/2018 FINDINGS: VASCULAR Aorta: Normal caliber of the abdominal aorta with atherosclerotic disease. Negative for an aortic dissection. Celiac: Patent without evidence of aneurysm, dissection, vasculitis or significant stenosis. Mild atherosclerotic disease. SMA: Patent without evidence of aneurysm, dissection, vasculitis or significant stenosis. Renals: Both renal arteries are patent without evidence of aneurysm, dissection, vasculitis, fibromuscular dysplasia or significant stenosis. IMA: IMA is patent. Inflow: Patent without evidence of aneurysm, dissection, vasculitis or significant stenosis. Proximal Outflow: Proximal femoral arteries are patent bilaterally. Veins: Portal venous system is patent. Normal appearance of the IVC and renal veins. Iliac veins are patent. Review of the MIP images confirms the above findings. NON-VASCULAR Lower chest: Again noted are innumerable pulmonary nodules compatible with metastatic disease. Many of the lesions have central cavitation. No pleural effusions. Hepatobiliary: Cholecystectomy. Chronic extrahepatic biliary dilatation measuring up to 1.4 cm and minimally changed. Main portal venous system is patent. Mild intrahepatic biliary dilatation. No suspicious liver lesions. Pancreas: Unremarkable. No pancreatic ductal dilatation or surrounding inflammatory changes. Spleen: Normal in size without focal abnormality. Adrenals/Urinary Tract: Normal adrenal glands. Normal appearance of both kidneys without hydronephrosis. Difficult to exclude mild bladder wall thickening but minimally changed from the previous examination. Stomach/Bowel: Normal appearance of the stomach and duodenum. Right hemicolectomy. Mucosal enhancement and wall thickening involving the rectum and sigmoid colon. There is no significant small bowel wall thickening. Negative for a bowel obstruction.  Lymphatic: No lymph node enlargement in the abdomen or pelvis. Reproductive: Stable appearance of the uterus and adnexal structures. Other: Stable stranding or edema in the pelvic region, including the presacral region. These findings are similar to the previous examination. Negative for free air. Musculoskeletal: Significant disc space narrowing and endplate disease at LA4-T3and L4-L5. Disc space narrowing at L1-L2. IMPRESSION: VASCULAR 1. Mild atherosclerotic disease the aorta and visceral arteries. No significant visceral artery stenosis. Negative for an aortic aneurysm. 2. Venous structures are unremarkable. NON-VASCULAR 1. Mucosal enhancement and wall thickening in the rectum and sigmoid colon. Findings are similar to the previous examination and probably  represent chronic inflammation related to cervical cancer treatment. Stable edema or stranding in the pelvis. 2. Right hemicolectomy. 3. Innumerable pulmonary nodules compatible with metastatic disease. 4. Stable biliary dilatation, status post cholecystectomy. Electronically Signed   By: Markus Daft M.D.   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  CT scan chest-February 2020-bilateral up to a centimeter sized lung nodules.  Stable  #Proceed with carbotaxol cycle #7 today. Labs today reviewed;  acceptable for treatment today.   #Hypomagnesemia-secondary platinum chemotherapy-remains in 1.5 proceed with 4 g magnesium today.  Stable  #Chronic right lower quadrant abdominal pain/scar tissue versus others-clinically stable continue oxycodone.  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  Followed by GI stable.  #History of DVT/PE-continue Xarelto.  Stable  # adrenal insuffiency- on maintenance steroids stable.  # Educated the patient regarding novel coronavirus-modes of  transmission/risks; and measures to avoid infection.   #Also had a discussion with the patient and mother regarding overall poor prognosis the median survival around 12 months or so.  Encouraged palliative care reevaluation at next visit.  # 40 minutes face-to-face with the patient discussing the above plan of care; more than 50% of time spent on prognosis/ natural history; counseling and coordination.   # DISPOSITION: # proceed with carbo-taxol today; udenyca tomorrow #  magnseium IV 4 gm/ 2 hourstomorrow # follow up in 2 weeks/ no chemo- cbc/cmp/mag- magnesium 4gm. 2 hours-Dr.B  # I reviewed the blood work- with the patient in detail; also reviewed the imaging independently [as summarized above]; and with the patient in detail.    All questions were answered. The patient knows to call the clinic with any problems, questions or concerns.       Cammie Sickle, MD 01/01/2019 1:26 PM

## 2019-01-02 ENCOUNTER — Ambulatory Visit: Payer: Medicaid Other

## 2019-01-02 ENCOUNTER — Ambulatory Visit: Payer: Medicaid Other | Admitting: Internal Medicine

## 2019-01-02 ENCOUNTER — Other Ambulatory Visit: Payer: Self-pay

## 2019-01-02 ENCOUNTER — Other Ambulatory Visit: Payer: Medicaid Other

## 2019-01-02 ENCOUNTER — Inpatient Hospital Stay: Payer: Medicaid Other

## 2019-01-02 ENCOUNTER — Other Ambulatory Visit: Payer: Self-pay | Admitting: Gastroenterology

## 2019-01-02 DIAGNOSIS — K529 Noninfective gastroenteritis and colitis, unspecified: Secondary | ICD-10-CM

## 2019-01-02 DIAGNOSIS — Z5112 Encounter for antineoplastic immunotherapy: Secondary | ICD-10-CM | POA: Diagnosis not present

## 2019-01-02 DIAGNOSIS — C538 Malignant neoplasm of overlapping sites of cervix uteri: Secondary | ICD-10-CM

## 2019-01-02 DIAGNOSIS — Z7189 Other specified counseling: Secondary | ICD-10-CM

## 2019-01-02 MED ORDER — PEGFILGRASTIM INJECTION 6 MG/0.6ML ~~LOC~~
6.0000 mg | PREFILLED_SYRINGE | Freq: Once | SUBCUTANEOUS | Status: AC
  Administered 2019-01-02: 6 mg via SUBCUTANEOUS

## 2019-01-06 ENCOUNTER — Encounter: Payer: Self-pay | Admitting: Oncology

## 2019-01-06 ENCOUNTER — Inpatient Hospital Stay: Payer: Medicaid Other

## 2019-01-06 ENCOUNTER — Other Ambulatory Visit: Payer: Self-pay

## 2019-01-06 ENCOUNTER — Telehealth: Payer: Self-pay | Admitting: *Deleted

## 2019-01-06 ENCOUNTER — Inpatient Hospital Stay (HOSPITAL_BASED_OUTPATIENT_CLINIC_OR_DEPARTMENT_OTHER): Payer: Medicaid Other | Admitting: Oncology

## 2019-01-06 VITALS — BP 124/82 | HR 90

## 2019-01-06 VITALS — BP 128/92 | HR 114 | Temp 97.9°F | Resp 24

## 2019-01-06 DIAGNOSIS — Z87891 Personal history of nicotine dependence: Secondary | ICD-10-CM

## 2019-01-06 DIAGNOSIS — R112 Nausea with vomiting, unspecified: Secondary | ICD-10-CM

## 2019-01-06 DIAGNOSIS — Z923 Personal history of irradiation: Secondary | ICD-10-CM

## 2019-01-06 DIAGNOSIS — R197 Diarrhea, unspecified: Secondary | ICD-10-CM

## 2019-01-06 DIAGNOSIS — Z5112 Encounter for antineoplastic immunotherapy: Secondary | ICD-10-CM | POA: Diagnosis not present

## 2019-01-06 DIAGNOSIS — C538 Malignant neoplasm of overlapping sites of cervix uteri: Secondary | ICD-10-CM | POA: Diagnosis not present

## 2019-01-06 DIAGNOSIS — E860 Dehydration: Secondary | ICD-10-CM

## 2019-01-06 DIAGNOSIS — C7800 Secondary malignant neoplasm of unspecified lung: Secondary | ICD-10-CM | POA: Diagnosis not present

## 2019-01-06 DIAGNOSIS — Z79899 Other long term (current) drug therapy: Secondary | ICD-10-CM

## 2019-01-06 DIAGNOSIS — Z86711 Personal history of pulmonary embolism: Secondary | ICD-10-CM

## 2019-01-06 DIAGNOSIS — Z95828 Presence of other vascular implants and grafts: Secondary | ICD-10-CM

## 2019-01-06 DIAGNOSIS — R531 Weakness: Secondary | ICD-10-CM

## 2019-01-06 DIAGNOSIS — Z7901 Long term (current) use of anticoagulants: Secondary | ICD-10-CM

## 2019-01-06 DIAGNOSIS — Z7952 Long term (current) use of systemic steroids: Secondary | ICD-10-CM

## 2019-01-06 DIAGNOSIS — Z86718 Personal history of other venous thrombosis and embolism: Secondary | ICD-10-CM

## 2019-01-06 DIAGNOSIS — R1030 Lower abdominal pain, unspecified: Secondary | ICD-10-CM

## 2019-01-06 DIAGNOSIS — D6851 Activated protein C resistance: Secondary | ICD-10-CM

## 2019-01-06 DIAGNOSIS — B182 Chronic viral hepatitis C: Secondary | ICD-10-CM

## 2019-01-06 LAB — GASTROINTESTINAL PANEL BY PCR, STOOL (REPLACES STOOL CULTURE)

## 2019-01-06 LAB — CBC WITH DIFFERENTIAL/PLATELET
Abs Immature Granulocytes: 0.13 10*3/uL — ABNORMAL HIGH (ref 0.00–0.07)
Basophils Absolute: 0.1 10*3/uL (ref 0.0–0.1)
Basophils Relative: 1 %
Eosinophils Absolute: 0 10*3/uL (ref 0.0–0.5)
Eosinophils Relative: 0 %
HCT: 38.7 % (ref 36.0–46.0)
Hemoglobin: 13.4 g/dL (ref 12.0–15.0)
Immature Granulocytes: 2 %
Lymphocytes Relative: 12 %
Lymphs Abs: 1 10*3/uL (ref 0.7–4.0)
MCH: 34.8 pg — ABNORMAL HIGH (ref 26.0–34.0)
MCHC: 34.6 g/dL (ref 30.0–36.0)
MCV: 100.5 fL — ABNORMAL HIGH (ref 80.0–100.0)
MONOS PCT: 2 %
Monocytes Absolute: 0.1 10*3/uL (ref 0.1–1.0)
Neutro Abs: 7.4 10*3/uL (ref 1.7–7.7)
Neutrophils Relative %: 83 %
Platelets: 160 10*3/uL (ref 150–400)
RBC: 3.85 MIL/uL — ABNORMAL LOW (ref 3.87–5.11)
RDW: 12.9 % (ref 11.5–15.5)
WBC: 8.8 10*3/uL (ref 4.0–10.5)
nRBC: 0 % (ref 0.0–0.2)

## 2019-01-06 LAB — C DIFFICILE QUICK SCREEN W PCR REFLEX
C Diff antigen: NEGATIVE
C Diff interpretation: NOT DETECTED
C Diff toxin: NEGATIVE

## 2019-01-06 LAB — BASIC METABOLIC PANEL
Anion gap: 13 (ref 5–15)
BUN: 23 mg/dL — ABNORMAL HIGH (ref 6–20)
CHLORIDE: 96 mmol/L — AB (ref 98–111)
CO2: 22 mmol/L (ref 22–32)
Calcium: 9.6 mg/dL (ref 8.9–10.3)
Creatinine, Ser: 0.9 mg/dL (ref 0.44–1.00)
GFR calc Af Amer: >60 mL/min (ref >60)
GFR calc non Af Amer: >60 mL/min (ref >60)
GLUCOSE: 145 mg/dL — AB (ref 70–99)
Potassium: 3.9 mmol/L (ref 3.5–5.1)
Sodium: 131 mmol/L — ABNORMAL LOW (ref 135–145)

## 2019-01-06 LAB — MAGNESIUM: Magnesium: 1.6 mg/dL — ABNORMAL LOW (ref 1.7–2.4)

## 2019-01-06 MED ORDER — HEPARIN SOD (PORK) LOCK FLUSH 100 UNIT/ML IV SOLN
500.0000 [IU] | Freq: Once | INTRAVENOUS | Status: AC
  Administered 2019-01-06: 500 [IU] via INTRAVENOUS

## 2019-01-06 MED ORDER — ONDANSETRON HCL 4 MG/2ML IJ SOLN
INTRAMUSCULAR | Status: AC
  Filled 2019-01-06: qty 2

## 2019-01-06 MED ORDER — SODIUM CHLORIDE 0.9 % IV SOLN
Freq: Once | INTRAVENOUS | Status: AC
  Administered 2019-01-06: 11:00:00 via INTRAVENOUS
  Filled 2019-01-06: qty 250

## 2019-01-06 MED ORDER — SODIUM CHLORIDE 0.9% FLUSH
10.0000 mL | INTRAVENOUS | Status: DC | PRN
  Administered 2019-01-06: 10 mL via INTRAVENOUS
  Filled 2019-01-06: qty 10

## 2019-01-06 MED ORDER — ONDANSETRON HCL 4 MG/2ML IJ SOLN
4.0000 mg | Freq: Once | INTRAMUSCULAR | Status: AC
  Administered 2019-01-06: 4 mg via INTRAVENOUS

## 2019-01-06 MED ORDER — OCTREOTIDE LOAD VIA INFUSION: 100.0000 ug | Freq: Once | INTRAVENOUS | Status: DC

## 2019-01-06 MED ORDER — OCTREOTIDE ACETATE 100 MCG/ML IJ SOLN
100.0000 ug | Freq: Once | INTRAMUSCULAR | Status: AC
  Administered 2019-01-06: 100 ug via SUBCUTANEOUS
  Filled 2019-01-06: qty 1

## 2019-01-06 MED ORDER — MORPHINE SULFATE (PF) 2 MG/ML IV SOLN
2.0000 mg | Freq: Once | INTRAVENOUS | Status: AC
  Administered 2019-01-06: 2 mg via INTRAVENOUS
  Filled 2019-01-06: qty 1

## 2019-01-06 MED ORDER — OXYCODONE HCL ER 15 MG PO T12A: 15.0000 mg | EXTENDED_RELEASE_TABLET | Freq: Two times a day (BID) | ORAL | 0 refills | Status: DC

## 2019-01-06 MED ORDER — MORPHINE SULFATE (PF) 2 MG/ML IV SOLN
4.0000 mg | Freq: Once | INTRAVENOUS | Status: AC
  Administered 2019-01-06: 4 mg via INTRAVENOUS
  Filled 2019-01-06: qty 2

## 2019-01-06 NOTE — Telephone Encounter (Addendum)
Patient called reporting that she is having vomiting and diarrhea with blood in her stools since Friday She is having more than 7 diarrhea stools per day. Please advise

## 2019-01-06 NOTE — Progress Notes (Signed)
Symptom Management Consult note [facility]  [phone] [phone]  Patient Care Team: Arnetha Courser, MD as PCP - General (Family Medicine) Mellody Drown, MD as Consulting Physician (Obstetrics and Gynecology) Lenor Coffin, MD as Attending Physician (Gynecology) Lequita Asal, MD as Medical Oncologist (Hematology and Oncology) [name], MD as Consulting Physician (Gastroenterology) Michael Boston, MD as Consulting Physician (General Surgery) Lovell Sheehan, MD as Consulting Physician (Orthopedic Surgery) Clent Jacks, RN as Registered Nurse   Name of the patient: Alton Tremblay  [id number]  1966/01/25   Date of visit: 01/06/2019   Diagnosis- Cervical Cancer   Chief complaint/ Reason for visit- abdominal pain/nausea/vomiting/diarrhea  Heme/Onc history: Patient last evaluated by primary oncologist Dr. Yevette Edwards on 01/01/2019 for initial meeting/consultation after switching from Dr. Mike Gip.  She felt improved with better pain control.  She denied rectal bleeding, nausea or tingling or numbness.  Admitted to minor fatigue.  Continued to require IV electrolyte replacement often.  Received 4 g magnesium.  Recently evaluated in the emergency room for abdominal pain, nausea and vomiting.  CT scan unrevealing except for multiple bilateral lung nodules up to 1 cm in size.  Plan was for continued chemotherapy with carbo/Taxol q 3 weeks.   Oncology History   # dec 2017- CERVICAL ADENO CA [[place]]; Stage IB [Dr. Christene Slates at [place] center in [place], [place];No surgery- Chemo-RT;  # 2018-sep - lung nodules [in [place]]  # AUG 20th, 2019-#1 Carbo-Taxol s/p 6 cycles- [No avastin sec to blood clots]: Feb 2020-bilateral lung nodules with 1 cm in size.  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  She received treatment from 11/2016-05/2017.  01/2017 cisplatin x 2 and carboplatin x 1 (01/29/2017) due to ARF and XRT.  XRT was followed by T & O on 02/01/2017 and T & N 02/10/2017 and 02/20/2017.  Course was complicated by 80 pound weight loss, nausea, vomiting, electrolyte wasting (potassium and magnesium).  She describes that.  Is been sick constantly requiring at least 20 hospitalizations.  Follow-up CT chest and PET on 06/2017. Per patient, 'radiation worked' and no disease in the abdomen.  At that time she was noted to have lung nodules that were growing and follow-up imaging was scheduled for 10/2017.  She was admitted to hospital in [place] for small bowel obstruction which was managed conservatively and she was home for a week prior to traveling to [place] for Thanksgiving holiday where she has family.  She presented to ER in [place] on 08/2017 with nausea, vomiting, and lower abdominal pain.  Symptoms did not respond to conservative treatment.  CT on 08/26/2017 revealed small bowel obstruction with transition in the pelvis just superior to the uterus rather was a long segment of distal ileum with fatty wall thickening compatible with chronic inflammation and/or radiation enteritis. Imaging showed numerous pulmonary nodules consistent with metastatic disease. She underwent laparotomy and right ileocolectomy on 08/31/2017 at [facility].  Surgical findings revealed a thickened, matted, and scarred piece of distal small bowel close to the ileocecal valve.  She was discharged on 09/05/2017.  Pain markedly increased in intensity and imaging was performed on 09/11/2017 which revealed: Debris within the anterior abdominal wall incision concerning for  infection versus packing  material, s/p post ileo-colectomy with expected postoperative changes, mild colonic ileus, numerous pulmonary nodules highly concerning for metastatic disease, punctate nonobstructing nephrolithiasis.  Staples were removed and one was packed.  She was started on doxycycline.  Abdominal and pelvic CT without contrast on 09/11/2017 revealed debris within anterior abdominal wall incision concerning for infection, versus packing material.She is s/pileocolectomy with expected postoperative changes and mild colonic ileus. There were numerous pulmonary nodules highly concerning for metastatic diseaseand punctate nonobstructing nephrolithiasis. She was readmitted on 09/12/2017. She describes the onset of lower abdominal pain on 09/09/2017. Pain markedly increased in intensity on 09/11/2017.  Staples were removed and the wound packed. She was started on doxycycline.  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  PET scan was ordered to evaluate enlarging lung nodules with concern for recurrent cervical cancer but scan was delayed due to insurance and need to be performed in [place].  Presented to ER on 12/03/2017 for abdominal pain and emesis.  Imaging concerning for worsening possible uterine infarct and she was admitted to hospital.  Pelvic MRI was unremarkable.  Remote scarring type changes of uterus thought to be possibly related to radiation.  She was discharged on 12/08/2017.  Underwent endoscopy and colonoscopy on 12/20/2017.    On 02/22/2018 she underwent laser ablation of condylomata around the anus and vagina under  anesthesia with Dr. Johney Maine.   04/15/2018: Chest, abdomen, and pelvis CTrevealed innumerable (>100) cavitary nodules scattered in the lungs, moderately enlarging compared to the 11/08/2017 PET-CT, suspicious for metastatic disease. One index node in the RLL measures 1.0 x 1.1 cm (previously 0.6 x 0.6 cm). There were no new nodules. There was an ill-defined wall thickening in the rectosigmoid with surrounding stranding along fascia planes, probably sequela from prior radiation therapy. There was multilevel lumbar impingement due to spondylosis and degenerative disc disease. There was heterogeneous enhancement in the uterus (some possibly from prior radiation therapy).  04/23/2018:PET scan revealednumerous scattered solid and cavitary nodules in the lungs stable increased in size compared to the prior PET-CT from 11/08/2017.Largest nodule was 1.1 cm in the LUL (SUV 1.9).These demonstratedlow-grade metabolic activity up to a maximum SUV of about 2.3, increased from 11/08/2017.   Case was discussed at tumor board on 04/25/2018. Consensus to pursue CT-guided biopsy (05/06/18) which revealed: Metastatic adenocarcinoma, morphologically consistent with cervical adenocarcinoma.  She has history of chronic hepatitis C which is managed by GI.  Hepatitis C genotype is 2a/2c.  She receives B12 injections for history of B12 deficiency.  On 04/24/2018 she underwent left total knee replacement with Dr. [name]. --------------------------------------------------------   DIAGNOSIS: Cervical adenocarcinoma  STAGE: IV        ;GOALS: Palliative  CURRENT/MOST RECENT THERAPY : Carbotaxol      Malignant neoplasm of overlapping sites of cervix (HCC)     Interval history- Abdominal Pain: Patient complains of abdominal pain, nausea, vomiting and diarrhea X 2 days. The pain is described as cramping, sharp and shooting, and is 10/10 in intensity. Pain is located in the suprapubic with radiation to perineum.  Onset was 2 days ago. Symptoms have been rapidly worsening since. Aggravating factors: activity, bowel movement  and movement.  Alleviating factors: narcotics and limiting movement. Associated symptoms: anorexia, diarrhea, hematochezia, myalgias, nausea and vomiting. The patient denies fever, headache and hematuria. Denies any neurologic complaints. Denies recent fevers or illnesses. Denies any easy bleeding or bruising. Reports good appetite and denies weight loss. Denies chest pain. Denies constipation. Denies urinary complaints. Patient offers no further specific complaints today.  ECOG FS:1 - Symptomatic but completely ambulatory  Review of systems- Review of Systems  Constitutional: Positive for malaise/fatigue. Negative for chills, fever and weight loss.  HENT: Negative for congestion, ear pain and tinnitus.   Eyes: Negative.  Negative for blurred vision and double vision.  Respiratory: Negative.  Negative for cough, sputum production and shortness of breath.   Cardiovascular: Negative.  Negative for chest pain, palpitations and leg swelling.  Gastrointestinal: Positive for abdominal pain, blood in stool, diarrhea, nausea and vomiting. Negative for constipation.  Genitourinary: Negative for dysuria, frequency and urgency.  Musculoskeletal: Positive for myalgias. Negative for back pain and falls.  Skin: Negative.  Negative for rash.  Neurological: Positive for weakness. Negative for headaches.  Endo/Heme/Allergies: Negative.  Does not bruise/bleed easily.  Psychiatric/Behavioral: Positive for depression. The patient is not nervous/anxious and does not have insomnia.      Current treatment- s/p Cycle 7 carbo/taxol . Last given 01/01/19.   Allergies  Allergen Reactions   Ketamine Anxiety and Other (See Comments)    Syncope episode/confusion      Past Medical History:  Diagnosis Date   Abdominal pain 06/10/2018   Abnormal cervical Papanicolaou smear 09/18/2017   Anxiety    Aortic  atherosclerosis (HCC)    Arthritis    neck and knees   Blood clots in brain    both lungs and right kidney   Blood transfusion without reported diagnosis    Cervical cancer ([place]) 09/2016   mets lung   Chronic anal fissure    Chronic diarrhea    Dyspnea    Erosive gastropathy 09/18/2017   Factor V Leiden mutation ([place])    Fecal incontinence    Genital warts    GERD (gastroesophageal reflux disease)    Heart murmur    Hematochezia    Hemorrhoids    Hepatitis C    Chronic, 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   Intestinal infection due to Clostridium difficile 09/18/2017   Macrocytic anemia with vitamin B12 deficiency    Multiple gastric ulcers    Nausea vomiting and diarrhea    Pancolitis ([place]) 07/27/2018   Perianal condylomata    Pneumonia    History of   Pulmonary nodules    Rectal bleeding    Small bowel obstruction ([place]) 08/2017   Stiff neck    limited right turn   Vitamin D deficiency      Past Surgical History:  Procedure Laterality Date   CHOLECYSTECTOMY     COLON SURGERY  08/2017   resection   COLONOSCOPY WITH PROPOFOL N/A 12/20/2017   Procedure: COLONOSCOPY WITH PROPOFOL;  Surgeon: [name], MD;  Location: [facility];  Service: Gastroenterology;  Laterality: N/A;   COLONOSCOPY WITH PROPOFOL N/A 07/30/2018   Procedure: COLONOSCOPY WITH PROPOFOL;  Surgeon: [name], MD;  Location: [facility] ENDOSCOPY;  Service: Gastroenterology;  Laterality: N/A;   COLONOSCOPY WITH PROPOFOL N/A 10/10/2018   Procedure: COLONOSCOPY WITH PROPOFOL;  Surgeon: Lucilla Lame, MD;  Location: [facility] ENDOSCOPY;  Service: Endoscopy;  Laterality: N/A;   DIAGNOSTIC  LAPAROSCOPY     ESOPHAGOGASTRODUODENOSCOPY (EGD) WITH PROPOFOL N/A 12/20/2017   Procedure: ESOPHAGOGASTRODUODENOSCOPY (EGD) WITH PROPOFOL;  Surgeon: [name], MD;  Location: [facility] ENDOSCOPY;  Service: Gastroenterology;  Laterality: N/A;   ESOPHAGOGASTRODUODENOSCOPY (EGD) WITH PROPOFOL  07/30/2018   Procedure: ESOPHAGOGASTRODUODENOSCOPY (EGD) WITH PROPOFOL;  Surgeon: [name], MD;  Location: [facility] ENDOSCOPY;  Service: Gastroenterology;;   [place] N/A 11/21/2018   Procedure: FLEXIBLE SIGMOIDOSCOPY;  Surgeon: [name], MD;  Location: [facility] ENDOSCOPY;  Service: Gastroenterology;  Laterality: N/A;   LAPAROTOMY N/A 08/31/2017   Procedure: EXPLORATORY LAPAROTOMY for SBO, ileocolectomy, removal of piece of uterine wall;  Surgeon: [name], MD;  Location: [facility] ORS;  Service: General;  Laterality: N/A;   LASER ABLATION CONDOLAMATA N/A 02/22/2018   Procedure: LASER ABLATION/REMOVAL OF GLOVFIEPPIR AROUND ANUS AND VAGINA;  Surgeon: Michael Boston, MD;  Location: [place];  Service: General;  Laterality: N/A;   OOPHORECTOMY     PORTA CATH INSERTION N/A 05/13/2018   Procedure: PORTA CATH INSERTION;  Surgeon: Katha Cabal, MD;  Location: [place] CV LAB;  Service: Cardiovascular;  Laterality: N/A;   SMALL INTESTINE SURGERY     TANDEM RING INSERTION     x3   THORACOTOMY     TOTAL KNEE ARTHROPLASTY Left 04/24/2018   Procedure: TOTAL KNEE ARTHROPLASTY;  Surgeon: Lovell Sheehan, MD;  Location: [facility] ORS;  Service: Orthopedics;  Laterality: Left;    Social History   Socioeconomic History   Marital status: Divorced    Spouse name: Not on file   Number of children: Not on file   Years of education: Not on file   Highest education level: Not on file  Occupational History   Not on file  Social Needs   Financial resource strain: Not hard at all   Food insecurity:    Worry: Never true    Inability: Never true    Transportation needs:    Medical: No    Non-medical: No  Tobacco Use   Smoking status: Former Smoker    Last attempt to quit: 10/16/2006    Years since quitting: 12.2   Smokeless tobacco: Never Used  Substance and Sexual Activity   Alcohol use: Not Currently    Frequency: Never    Comment: seldom   Drug use: Yes    Types: Marijuana   Sexual activity: Not Currently    Birth control/protection: Post-menopausal    Comment: Not Asked  Lifestyle   Physical activity:    Days per week: Patient refused    Minutes per session: Patient refused   Stress: Only a little  Relationships   Social connections:    Talks on phone: Patient refused    Gets together: Patient refused    Attends religious service: Patient refused    Active member of club or organization: Patient refused    Attends meetings of clubs or organizations: Patient refused    Relationship status: Patient refused   Intimate partner violence:    Fear of current or ex partner: No    Emotionally abused: No    Physically abused: No    Forced sexual activity: No  Other Topics Concern   Not on file  Social History Narrative   Not on file  Family History  Problem Relation Age of Onset   Hypertension Father    Diabetes Father    Alcohol abuse Daughter    Hypertension Maternal Grandmother    Diabetes Maternal Grandmother    Diabetes Paternal Grandmother    Hypertension Paternal Grandmother      Current Outpatient Medications:    amitriptyline (ELAVIL) 75 MG tablet, Take 1 tablet (75 mg total) by mouth at bedtime., Disp: 90 tablet, Rfl: 1   budesonide (ENTOCORT EC) 3 MG 24 hr capsule, Take 3 capsules (9 mg total) by mouth daily., Disp: 30 capsule, Rfl: 0   Calcium Carb-Cholecalciferol (CALCIUM 500 +D) 500-400 MG-UNIT TABS, Take 2 tablets by mouth daily., Disp: , Rfl:    diphenoxylate-atropine (LOMOTIL) 2.5-0.025 MG tablet, Take 2 tablets by mouth 4 (four) times daily. (Patient taking differently:  Take 4 tablets by mouth 4 (four) times daily. ), Disp: 30 tablet, Rfl: 0   famotidine (PEPCID) 20 MG tablet, Take 1 tablet (20 mg total) by mouth 2 (two) times daily as needed for heartburn or indigestion., Disp: , Rfl:    hydrocortisone (CORTEF) 10 MG tablet, Take 1 tablet (10 mg total) by mouth 2 (two) times daily., Disp: 180 tablet, Rfl: 0   hyoscyamine (LEVBID) 0.375 MG 12 hr tablet, Take 1 tablet (0.375 mg total)by mouth 2 (two) times daily., Disp: 60 tablet, Rfl: 0   Multiple Vitamins-Minerals (MULTIVITAMIN WITH MINERALS) tablet, Take 1 tablet by mouth daily., Disp: 30 tablet, Rfl: 0   ondansetron (ZOFRAN ODT) 4 MG disintegrating tablet, Take 1 tablet (4 mg total) by mouth every 8 (eight) hours as needed for nausea or vomiting., Disp: 20 tablet, Rfl: 0   ondansetron (ZOFRAN) 8 MG tablet, Take 1 tablet (8 mg total) by mouth every 8 (eight) hours as needed for nausea or vomiting., Disp: 30 tablet, Rfl: 1   oxyCODONE (OXYCONTIN) 15 mg 12 hr tablet, Take 1 tablet (15 mg total) by mouth every 12 (twelve) hours., Disp: 30 tablet, Rfl: 0   Oxycodone HCl 10 MG TABS, Take 1 tablet (10 mg total) by mouth every 6 (six) hours as needed., Disp: 60 tablet, Rfl: 0   pantoprazole (PROTONIX) 40 MG tablet, Take 1 tablet (40 mg total) by mouth daily., Disp: 90 tablet, Rfl: 0   potassium chloride SA (K-DUR,KLOR-CON) 20 MEQ tablet, Take 1 tablet (20 mEq total) by mouth daily., Disp: 30 tablet, Rfl: 3   promethazine (PHENERGAN) 25 MG tablet, Take 1 tablet (25 mg total) by mouth every 8 (eight) hours as needed for nausea or vomiting., Disp: 30 tablet, Rfl: 3   rivaroxaban (XARELTO) 20 MG TABS tablet, Take 20 mg by mouth daily with supper., Disp: , Rfl:    VENTOLIN HFA 108 (90 Base) MCG/ACT inhaler, Inhale 1-2 puffs into the lungs every 4 (four) hours as needed for shortness of breath. , Disp: , Rfl: 0   VIBERZI 100 MG TABS, TAKE 1 TABLET BY MOUTH TWICE DAILY WITH  A  MEAL, Disp: 60 tablet, Rfl: 0 No  current facility-administered medications for this visit.   Facility-Administered Medications Ordered in Other Visits:    heparin lock flush 100 unit/mL, 500 Units, Intravenous, Once, Corcoran, Melissa C, MD   sodium chloride 0.9 % 1,000 mL with potassium chloride 20 mEq infusion, , Intravenous, Once, Karen Kitchens, NP  Physical exam:  Vitals:   01/06/19 1039  BP: (!) 128/92  Pulse: (!) 114  Resp: (!) 24  Temp: 97.9 F (36.6 C)  TempSrc: Tympanic  SpO2:  100%   Physical Exam Constitutional:      Appearance: Normal appearance.  HENT:     Head: Normocephalic and atraumatic.  Eyes:     Pupils: Pupils are equal, round, and reactive to light.  Neck:     Musculoskeletal: Normal range of motion.  Cardiovascular:     Rate and Rhythm: Regular rhythm. Tachycardia present.     Heart sounds: Normal heart sounds. No murmur.  Pulmonary:     Effort: Pulmonary effort is normal.     Breath sounds: Normal breath sounds. No wheezing.  Abdominal:     General: Bowel sounds are increased. There is no distension.     Palpations: Abdomen is soft.     Tenderness: There is abdominal tenderness in the suprapubic area.  Genitourinary:    Vagina: Normal.     Rectum: Tenderness present.  Musculoskeletal: Normal range of motion.  Skin:    General: Skin is warm and dry.     Coloration: Skin is pale.     Findings: No rash.  Neurological:     Mental Status: She is alert and oriented to person, place, and time.  Psychiatric:        Judgment: Judgment normal.     CMP Latest Ref Rng & Units 01/06/2019  Glucose 70 - 99 mg/dL 145(H)  BUN 6 - 20 mg/dL 23(H)  Creatinine 0.44 - 1.00 mg/dL 0.90  Sodium 135 - 145 mmol/L 131(L)  Potassium 3.5 - 5.1 mmol/L 3.9  Chloride 98 - 111 mmol/L 96(L)  CO2 22 - 32 mmol/L 22  Calcium 8.9 - 10.3 mg/dL 9.6  Total Protein 6.5 - 8.1 g/dL -  Total Bilirubin 0.3 - 1.2 mg/dL -  Alkaline Phos 38 - 126 U/L -  AST 15 - 41 U/L -  ALT 0 - 44 U/L -   CBC Latest Ref Rng &  Units 01/06/2019  WBC 4.0 - 10.5 K/uL 8.8  Hemoglobin 12.0 - 15.0 g/dL 13.4  Hematocrit 36.0 - 46.0 % 38.7  Platelets 150 - 400 K/uL 160    No images are attached to the encounter.  Dg Abdomen Acute W/chest  Result Date: 12/26/2018 CLINICAL DATA:  Chest pain starting in our ago. Nausea, vomiting, and diarrhea. EXAM: DG ABDOMEN ACUTE W/ 1V CHEST COMPARISON:  Abdomen 11/26/2018 FINDINGS: Power port type central venous catheter with tip over the low SVC region. Normal heart size and pulmonary vascularity. No focal airspace disease or consolidation in the lungs. No blunting of costophrenic angles. No pneumothorax. Mediastinal contours appear intact. Degenerative changes in the spine and shoulders. Scattered gas and stool throughout the colon. No small or large bowel distention. No free intra-abdominal air. No abnormal air-fluid levels. Surgical clips in the right upper quadrant and right mid abdomen. Calcified phleboliths in the pelvis. No radiopaque stones. Degenerative changes in the spine. Soft tissue contours appear intact. IMPRESSION: No evidence of active pulmonary disease. Normal nonobstructive bowel gas pattern. Electronically Signed   By: Lucienne Capers M.D.   On: 12/26/2018 00:12   Ct Angio Abd/pel W And/or Wo Contrast  Result Date: 12/16/2018 CLINICAL DATA:  53 year old with abdominal pain. Abdominal pain is worse with bowel movements. History of cervical cancer. EXAM: CT ANGIOGRAPHY ABDOMEN AND PELVIS WITH CONTRAST AND WITHOUT CONTRAST TECHNIQUE: Multidetector CT imaging of the abdomen and pelvis was performed using the standard protocol during bolus administration of intravenous contrast. Multiplanar reconstructed images and MIPs were obtained and reviewed to evaluate the vascular anatomy. CONTRAST:  136m OMNIPAQUE IOHEXOL 350  MG/ML SOLN COMPARISON:  CT abdomen pelvis 11/26/2018 FINDINGS: VASCULAR Aorta: Normal caliber of the abdominal aorta with atherosclerotic disease. Negative for an  aortic dissection. Celiac: Patent without evidence of aneurysm, dissection, vasculitis or significant stenosis. Mild atherosclerotic disease. SMA: Patent without evidence of aneurysm, dissection, vasculitis or significant stenosis. Renals: Both renal arteries are patent without evidence of aneurysm, dissection, vasculitis, fibromuscular dysplasia or significant stenosis. IMA: IMA is patent. Inflow: Patent without evidence of aneurysm, dissection, vasculitis or significant stenosis. Proximal Outflow: Proximal femoral arteries are patent bilaterally. Veins: Portal venous system is patent. Normal appearance of the IVC and renal veins. Iliac veins are patent. Review of the MIP images confirms the above findings. NON-VASCULAR Lower chest: Again noted are innumerable pulmonary nodules compatible with metastatic disease. Many of the lesions have central cavitation. No pleural effusions. Hepatobiliary: Cholecystectomy. Chronic extrahepatic biliary dilatation measuring up to 1.4 cm and minimally changed. Main portal venous system is patent. Mild intrahepatic biliary dilatation. No suspicious liver lesions. Pancreas: Unremarkable. No pancreatic ductal dilatation or surrounding inflammatory changes. Spleen: Normal in size without focal abnormality. Adrenals/Urinary Tract: Normal adrenal glands. Normal appearance of both kidneys without hydronephrosis. Difficult to exclude mild bladder wall thickening but minimally changed from the previous examination. Stomach/Bowel: Normal appearance of the stomach and duodenum. Right hemicolectomy. Mucosal enhancement and wall thickening involving the rectum and sigmoid colon. There is no significant small bowel wall thickening. Negative for a bowel obstruction. Lymphatic: No lymph node enlargement in the abdomen or pelvis. Reproductive: Stable appearance of the uterus and adnexal structures. Other: Stable stranding or edema in the pelvic region, including the presacral region. These  findings are similar to the previous examination. Negative for free air. Musculoskeletal: Significant disc space narrowing and endplate disease at Y1-O1 and L4-L5. Disc space narrowing at L1-L2. IMPRESSION: VASCULAR 1. Mild atherosclerotic disease the aorta and visceral arteries. No significant visceral artery stenosis. Negative for an aortic aneurysm. 2. Venous structures are unremarkable. NON-VASCULAR 1. Mucosal enhancement and wall thickening in the rectum and sigmoid colon. Findings are similar to the previous examination and probably represent chronic inflammation related to cervical cancer treatment. Stable edema or stranding in the pelvis. 2. Right hemicolectomy. 3. Innumerable pulmonary nodules compatible with metastatic disease. 4. Stable biliary dilatation, status post cholecystectomy. Electronically Signed   By: Markus Daft M.D.   On: 12/16/2018 14:48    Assessment and plan- Patient is a 53 y.o. female who presents to [facility] for worsening abdominal pain, nausea, vomiting and diarrhea x2 days.  Stage IV cervical cancer with metastatic disease to lungs: s/p carbo/Taxol.  Last given on 09/30/2018.   Nausea/vomiting/diarrhea: Chronic.  Recurring problem.  Currently taking Viberzi, Lomotil and Cortef for diarrhea; Zofran and Phenergan for nausea/vomiting.   Abdominal/rectal pain: Likely d/t malignancy and previous hemicolectomy, chronic inflammatory changes to the colon, scar tissue, radiation-induced proctitis and symptomatic hemorrhoids exacerbated by recent chemo.  She is followed by GI; Dr. Marius Ditch.  Hospitalized 11/19/2018-11/25/2018 with GI bleed.  Had flex sig on 11/21/18 which was mostly unremarkable except edematous mucosa.  CT angio of abdomen pelvis revealed mucosal enhancement and wall thickening in the rectum and sigmoid colon which is essentially unchanged from previous study.  Previously managed with 10 mg oxycodone every 8 hours recently adjusted to every 6 hours with pain control.  Plan: Stat  labs. Collect stool sample to rule out infection.  C. difficile and GI panel.  Results: Negative.  In clinic administration: Give 1 L NaCl. Give 4 mg morphine.  Repeat  with 2 mg morphine hourly PRN. Give 4 mg Zofran. Previously had 4 mg PO prior to arrival.  Give Octreotide 100 mcg subcutaneous once today.  New/change in medications: RX Oxycotin 15 mg q 12 hours.  Continue Oxycodone 10 mg q 6 hours. (this is monitored and adjusted by Altha Harm, NP).    Disposition: RTC tomorrow for more fluids and 100 mcg octreotide subcutaneous.  Visit Diagnosis 1. Diarrhea, unspecified type   2. Malignant neoplasm metastatic to lung, unspecified laterality ([place])   3. Intractable vomiting with nausea, unspecified vomiting type     Patient expressed understanding and was in agreement with this plan. She also understands that She can call clinic at any time with any questions, concerns, or complaints.   Greater than 50% was spent in counseling and coordination of care with this patient including but not limited to discussion of the relevant topics above (See A&P) including, but not limited to diagnosis and management of acute and chronic medical conditions.   Thank you for allowing me to participate in the care of this very pleasant patient.   Jacquelin Hawking, NP [place] [facility] Cell - [phone] Pager- [phone] 01/08/2019 11:59 AM

## 2019-01-06 NOTE — Telephone Encounter (Signed)
Per VO Dr Rogue Bussing, patient to come in to be seen and get labs and IV fluids CBC, BMP, Mag. Patient accepts appointment and will be here in the next 30 minutes

## 2019-01-07 ENCOUNTER — Inpatient Hospital Stay: Payer: Medicaid Other

## 2019-01-07 ENCOUNTER — Other Ambulatory Visit: Payer: Self-pay

## 2019-01-07 ENCOUNTER — Encounter: Payer: Self-pay | Admitting: Oncology

## 2019-01-07 VITALS — BP 126/82 | HR 105 | Temp 97.6°F | Resp 20

## 2019-01-07 DIAGNOSIS — R531 Weakness: Secondary | ICD-10-CM

## 2019-01-07 DIAGNOSIS — Z95828 Presence of other vascular implants and grafts: Secondary | ICD-10-CM

## 2019-01-07 DIAGNOSIS — R197 Diarrhea, unspecified: Secondary | ICD-10-CM

## 2019-01-07 DIAGNOSIS — Z5112 Encounter for antineoplastic immunotherapy: Secondary | ICD-10-CM | POA: Diagnosis not present

## 2019-01-07 MED ORDER — OCTREOTIDE ACETATE 100 MCG/ML IJ SOLN
100.0000 ug | Freq: Once | INTRAMUSCULAR | Status: AC
  Administered 2019-01-07: 100 ug via SUBCUTANEOUS
  Filled 2019-01-07: qty 1

## 2019-01-07 MED ORDER — SODIUM CHLORIDE 0.9 % IV SOLN
Freq: Once | INTRAVENOUS | Status: AC
  Administered 2019-01-07: 10:00:00 via INTRAVENOUS
  Filled 2019-01-07: qty 250

## 2019-01-07 MED ORDER — OXYCODONE HCL 10 MG PO TABS: 10.0000 mg | ORAL_TABLET | Freq: Four times a day (QID) | ORAL | 0 refills | Status: DC | PRN

## 2019-01-07 MED ORDER — HEPARIN SOD (PORK) LOCK FLUSH 100 UNIT/ML IV SOLN
500.0000 [IU] | Freq: Once | INTRAVENOUS | Status: AC
  Administered 2019-01-07: 500 [IU] via INTRAVENOUS

## 2019-01-08 ENCOUNTER — Encounter: Payer: Self-pay | Admitting: Oncology

## 2019-01-09 ENCOUNTER — Encounter: Payer: Self-pay | Admitting: Internal Medicine

## 2019-01-09 ENCOUNTER — Encounter: Payer: Self-pay | Admitting: Oncology

## 2019-01-09 MED ORDER — OXYCODONE HCL 10 MG PO TABS: 10.0000 mg | ORAL_TABLET | Freq: Four times a day (QID) | ORAL | 0 refills | Status: DC | PRN

## 2019-01-13 ENCOUNTER — Other Ambulatory Visit: Payer: Self-pay

## 2019-01-13 DIAGNOSIS — C538 Malignant neoplasm of overlapping sites of cervix uteri: Secondary | ICD-10-CM

## 2019-01-14 ENCOUNTER — Other Ambulatory Visit: Payer: Self-pay

## 2019-01-15 ENCOUNTER — Inpatient Hospital Stay (HOSPITAL_BASED_OUTPATIENT_CLINIC_OR_DEPARTMENT_OTHER): Payer: Medicaid Other | Admitting: Internal Medicine

## 2019-01-15 ENCOUNTER — Inpatient Hospital Stay: Payer: Medicaid Other | Attending: Internal Medicine | Admitting: *Deleted

## 2019-01-15 ENCOUNTER — Other Ambulatory Visit: Payer: Self-pay

## 2019-01-15 ENCOUNTER — Inpatient Hospital Stay: Payer: Medicaid Other | Attending: Internal Medicine

## 2019-01-15 ENCOUNTER — Encounter: Payer: Self-pay | Admitting: Internal Medicine

## 2019-01-15 ENCOUNTER — Inpatient Hospital Stay (HOSPITAL_BASED_OUTPATIENT_CLINIC_OR_DEPARTMENT_OTHER): Payer: Medicaid Other | Admitting: Hospice and Palliative Medicine

## 2019-01-15 DIAGNOSIS — C538 Malignant neoplasm of overlapping sites of cervix uteri: Secondary | ICD-10-CM | POA: Diagnosis not present

## 2019-01-15 DIAGNOSIS — N390 Urinary tract infection, site not specified: Secondary | ICD-10-CM | POA: Insufficient documentation

## 2019-01-15 DIAGNOSIS — C539 Malignant neoplasm of cervix uteri, unspecified: Secondary | ICD-10-CM | POA: Diagnosis present

## 2019-01-15 DIAGNOSIS — C7800 Secondary malignant neoplasm of unspecified lung: Secondary | ICD-10-CM | POA: Insufficient documentation

## 2019-01-15 DIAGNOSIS — D6851 Activated protein C resistance: Secondary | ICD-10-CM | POA: Insufficient documentation

## 2019-01-15 DIAGNOSIS — Z7901 Long term (current) use of anticoagulants: Secondary | ICD-10-CM | POA: Insufficient documentation

## 2019-01-15 DIAGNOSIS — E2740 Unspecified adrenocortical insufficiency: Secondary | ICD-10-CM | POA: Insufficient documentation

## 2019-01-15 DIAGNOSIS — Z86718 Personal history of other venous thrombosis and embolism: Secondary | ICD-10-CM | POA: Insufficient documentation

## 2019-01-15 DIAGNOSIS — R002 Palpitations: Secondary | ICD-10-CM | POA: Insufficient documentation

## 2019-01-15 DIAGNOSIS — Z79899 Other long term (current) drug therapy: Secondary | ICD-10-CM | POA: Insufficient documentation

## 2019-01-15 DIAGNOSIS — Z86711 Personal history of pulmonary embolism: Secondary | ICD-10-CM | POA: Diagnosis not present

## 2019-01-15 DIAGNOSIS — R197 Diarrhea, unspecified: Secondary | ICD-10-CM | POA: Insufficient documentation

## 2019-01-15 DIAGNOSIS — M79652 Pain in left thigh: Secondary | ICD-10-CM | POA: Diagnosis not present

## 2019-01-15 DIAGNOSIS — R079 Chest pain, unspecified: Secondary | ICD-10-CM | POA: Diagnosis not present

## 2019-01-15 DIAGNOSIS — Z7952 Long term (current) use of systemic steroids: Secondary | ICD-10-CM | POA: Insufficient documentation

## 2019-01-15 DIAGNOSIS — Z5111 Encounter for antineoplastic chemotherapy: Secondary | ICD-10-CM | POA: Insufficient documentation

## 2019-01-15 DIAGNOSIS — R1031 Right lower quadrant pain: Secondary | ICD-10-CM | POA: Diagnosis not present

## 2019-01-15 DIAGNOSIS — Z515 Encounter for palliative care: Secondary | ICD-10-CM

## 2019-01-15 DIAGNOSIS — G893 Neoplasm related pain (acute) (chronic): Secondary | ICD-10-CM

## 2019-01-15 DIAGNOSIS — K56609 Unspecified intestinal obstruction, unspecified as to partial versus complete obstruction: Secondary | ICD-10-CM | POA: Insufficient documentation

## 2019-01-15 DIAGNOSIS — Z95828 Presence of other vascular implants and grafts: Secondary | ICD-10-CM

## 2019-01-15 LAB — CBC WITH DIFFERENTIAL/PLATELET
Abs Immature Granulocytes: 0.03 10*3/uL (ref 0.00–0.07)
Basophils Absolute: 0 10*3/uL (ref 0.0–0.1)
Basophils Relative: 0 %
Eosinophils Absolute: 0 10*3/uL (ref 0.0–0.5)
Eosinophils Relative: 1 %
HCT: 27.5 % — ABNORMAL LOW (ref 36.0–46.0)
Hemoglobin: 9.4 g/dL — ABNORMAL LOW (ref 12.0–15.0)
Immature Granulocytes: 1 %
Lymphocytes Relative: 12 %
Lymphs Abs: 0.5 10*3/uL — ABNORMAL LOW (ref 0.7–4.0)
MCH: 35.5 pg — ABNORMAL HIGH (ref 26.0–34.0)
MCHC: 34.2 g/dL (ref 30.0–36.0)
MCV: 103.8 fL — ABNORMAL HIGH (ref 80.0–100.0)
Monocytes Absolute: 0.2 10*3/uL (ref 0.1–1.0)
Monocytes Relative: 5 %
Neutro Abs: 3.6 10*3/uL (ref 1.7–7.7)
Neutrophils Relative %: 81 %
Platelets: 77 10*3/uL — ABNORMAL LOW (ref 150–400)
RBC: 2.65 MIL/uL — ABNORMAL LOW (ref 3.87–5.11)
RDW: 13.5 % (ref 11.5–15.5)
WBC: 4.3 10*3/uL (ref 4.0–10.5)
nRBC: 0 % (ref 0.0–0.2)

## 2019-01-15 LAB — COMPREHENSIVE METABOLIC PANEL
ALT: 32 U/L (ref 0–44)
AST: 30 U/L (ref 15–41)
Albumin: 3.5 g/dL (ref 3.5–5.0)
Alkaline Phosphatase: 190 U/L — ABNORMAL HIGH (ref 38–126)
Anion gap: 7 (ref 5–15)
BUN: 6 mg/dL (ref 6–20)
CO2: 23 mmol/L (ref 22–32)
Calcium: 8.6 mg/dL — ABNORMAL LOW (ref 8.9–10.3)
Chloride: 106 mmol/L (ref 98–111)
Creatinine, Ser: 0.62 mg/dL (ref 0.44–1.00)
GFR calc Af Amer: >60 mL/min (ref >60)
GFR calc non Af Amer: >60 mL/min (ref >60)
Glucose, Bld: 98 mg/dL (ref 70–99)
Potassium: 3.2 mmol/L — ABNORMAL LOW (ref 3.5–5.1)
Sodium: 136 mmol/L (ref 135–145)
Total Bilirubin: 0.5 mg/dL (ref 0.3–1.2)
Total Protein: 7.3 g/dL (ref 6.5–8.1)

## 2019-01-15 LAB — MAGNESIUM: Magnesium: 1.4 mg/dL — ABNORMAL LOW (ref 1.7–2.4)

## 2019-01-15 MED ORDER — HEPARIN SOD (PORK) LOCK FLUSH 100 UNIT/ML IV SOLN
500.0000 [IU] | Freq: Once | INTRAVENOUS | Status: AC | PRN
  Administered 2019-01-15: 12:00:00 500 [IU]
  Filled 2019-01-15: qty 5

## 2019-01-15 MED ORDER — MAGNESIUM SULFATE 4 GM/100ML IV SOLN
4.0000 g | Freq: Once | INTRAVENOUS | Status: AC
  Administered 2019-01-15: 4 g via INTRAVENOUS
  Filled 2019-01-15: qty 100

## 2019-01-15 MED ORDER — SODIUM CHLORIDE 0.9% FLUSH
10.0000 mL | Freq: Once | INTRAVENOUS | Status: AC
  Administered 2019-01-15: 09:00:00 10 mL via INTRAVENOUS
  Filled 2019-01-15: qty 10

## 2019-01-15 MED ORDER — SODIUM CHLORIDE 0.9 % IV SOLN
INTRAVENOUS | Status: DC
  Administered 2019-01-15: 10:00:00 via INTRAVENOUS
  Filled 2019-01-15: qty 250

## 2019-01-15 NOTE — Assessment & Plan Note (Addendum)
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  CT scan chest-February 2020-bilateral up to a centimeter sized lung nodules.  Stable.  #Currently status post carbotaxol cycle #7 2 weeks ago.  We will proceed with cycle #8 in 1 week.  #Hypomagnesemia-secondary platinum chemotherapy-remains in 1.4 proceed with 4 g magnesium today. STABLE>   #Chronic right lower quadrant abdominal pain/scar tissue versus others-clinically stable continue oxycodone.  #Intermittent diarrhea-question withdrawal from narcotic.  Discussed with [employer].  Improved.  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  Followed by GI stable.  #History of DVT/PE-continue Xarelto.  STABLE>   # adrenal insuffiency- on maintenance steroids-STABLE.    # Educated the patient regarding novel coronavirus-modes of transmission/risks; and measures to avoid infection.   # DISPOSITION: # magnseium IV 4 gm/ 2 hours today.  # follow up in 1 week- MD/ carbo-taxol;Mag infusion;  D-2 udenyca # 2 weeks- mag 4 gm infusion. # follow up in 3 weeks/ carbo-Taxol cbc/cmp/mag- magnesium 4 gm. 2 hours-Dr.B

## 2019-01-15 NOTE — Progress Notes (Signed)
[facility]  [phone] [phone]   Name: Joanna Hall Date: 01/15/2019 MRN: [id number]  DOB: [birth year]  Patient Care Team: Arnetha Courser, MD as PCP - General (Family Medicine) Mellody Drown, MD as Consulting Physician (Obstetrics and Gynecology) Lenor Coffin, MD as Attending Physician (Gynecology) Lequita Asal, MD as Medical Oncologist (Hematology and Oncology) [name], MD as Consulting Physician (Gastroenterology) Michael Boston, MD as Consulting Physician (General Surgery) Lovell Sheehan, MD as Consulting Physician (Orthopedic Surgery) Clent Jacks, RN as Registered Nurse    REASON FOR CONSULTATION: Palliative Care consult requested for this 53 y.o. 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  PMH also notable for history of right hemicolectomy with chronic inflammatory changes to the colon.  Patient is followed by GI.  She was recently hospitalized 11/19/2020 11/25/2018 with GI bleed.  Patient underwent flex sig on 2/6 but was mostly unremarkable except edematous mucosa.  Patient has chronic abdominal pain.  She presented to the ER on 12/16/2018 with acute on chronic intractable abdominal pain.  Patient had CT angio of the abdomen/pelvis, which revealed mucosal enhancement and wall thickening in the rectum and sigmoid colon but was essentially unchanged from previous studies.  Palliative care was consulted to help address symptoms and goals.  SOCIAL HISTORY:    Patient is not married.  She lives at home with her mother and father.  She has an adult daughter who lives in [place], [place].  She had another daughter who is now deceased.  ADVANCE DIRECTIVES:  Not on file   CODE STATUS:   PAST MEDICAL HISTORY: Past Medical History:  Diagnosis Date   Abdominal pain  06/10/2018   Abnormal cervical Papanicolaou smear 09/18/2017   Anxiety    Aortic atherosclerosis (HCC)    Arthritis    neck and knees   Blood clots in brain    both lungs and right kidney   Blood transfusion without reported diagnosis    Cervical cancer ([place]) 09/2016   mets lung   Chronic anal fissure    Chronic diarrhea    Dyspnea    Erosive gastropathy 09/18/2017   Factor V Leiden mutation ([place])    Fecal incontinence    Genital warts    GERD (gastroesophageal reflux disease)    Heart murmur    Hematochezia    Hemorrhoids    Hepatitis C    Chronic, 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   Intestinal infection due to Clostridium difficile 09/18/2017   Macrocytic anemia with vitamin B12 deficiency    Multiple gastric ulcers    Nausea vomiting and diarrhea    Pancolitis ([place]) 07/27/2018   Perianal condylomata    Pneumonia    History of   Pulmonary nodules    Rectal bleeding    Small bowel obstruction ([place]) 08/2017   Stiff neck    limited right turn   Vitamin D deficiency     PAST SURGICAL HISTORY:  Past Surgical History:  Procedure Laterality Date   CHOLECYSTECTOMY     COLON SURGERY  08/2017   resection   COLONOSCOPY WITH PROPOFOL N/A 12/20/2017   Procedure: COLONOSCOPY WITH PROPOFOL;  Surgeon: [name], MD;  Location: [facility] ENDOSCOPY;  Service: Gastroenterology;  Laterality: N/A;   COLONOSCOPY WITH PROPOFOL N/A 07/30/2018   Procedure: COLONOSCOPY WITH PROPOFOL;  Surgeon: [name], MD;  Location: [facility] ENDOSCOPY;  Service: Gastroenterology;  Laterality: N/A;   COLONOSCOPY WITH PROPOFOL N/A 10/10/2018   Procedure: COLONOSCOPY WITH PROPOFOL;   Surgeon: Lucilla Lame, MD;  Location: [facility] ENDOSCOPY;  Service: Endoscopy;  Laterality: N/A;   DIAGNOSTIC LAPAROSCOPY     ESOPHAGOGASTRODUODENOSCOPY (EGD) WITH PROPOFOL N/A 12/20/2017   Procedure: ESOPHAGOGASTRODUODENOSCOPY (EGD) WITH PROPOFOL;  Surgeon: [name], MD;  Location: [facility] ENDOSCOPY;  Service: Gastroenterology;  Laterality: N/A;   ESOPHAGOGASTRODUODENOSCOPY (EGD) WITH PROPOFOL  07/30/2018   Procedure: ESOPHAGOGASTRODUODENOSCOPY (EGD) WITH PROPOFOL;  Surgeon: [name], MD;  Location: [facility] ENDOSCOPY;  Service: Gastroenterology;;   [place] N/A 11/21/2018   Procedure: FLEXIBLE SIGMOIDOSCOPY;  Surgeon: [name], MD;  Location: [facility] ENDOSCOPY;  Service: Gastroenterology;  Laterality: N/A;   LAPAROTOMY N/A 08/31/2017   Procedure: EXPLORATORY LAPAROTOMY for SBO, ileocolectomy, removal of piece of uterine wall;  Surgeon: [name], MD;  Location: [facility] ORS;  Service: General;  Laterality: N/A;   LASER ABLATION CONDOLAMATA N/A 02/22/2018   Procedure: LASER ABLATION/REMOVAL OF [id number] AROUND ANUS AND VAGINA;  Surgeon: Michael Boston, MD;  Location: [facility];  Service: General;  Laterality: N/A;   OOPHORECTOMY     PORTA CATH INSERTION N/A 05/13/2018   Procedure: PORTA CATH INSERTION;  Surgeon: Katha Cabal, MD;  Location: [facility] CV LAB;  Service: Cardiovascular;  Laterality: N/A;   SMALL INTESTINE SURGERY     TANDEM RING INSERTION     x3   THORACOTOMY     TOTAL KNEE ARTHROPLASTY Left 04/24/2018   Procedure: TOTAL KNEE ARTHROPLASTY;  Surgeon: Lovell Sheehan, MD;  Location: [facility] ORS;  Service: Orthopedics;  Laterality: Left;    HEMATOLOGY/ONCOLOGY HISTORY:  Oncology History   # dec 2017- CERVICAL ADENO CA [[place]]; Stage IB [Dr. Christene Slates at [facility] in [place], [place];No surgery- Chemo-RT;  # 2018-sep - lung nodules [in [place]]  # AUG 20th, 2019-#1 Carbo-Taxol  s/p 6 cycles- [No avastin sec to blood clots]: Feb 2020-bilateral lung nodules with 1 cm in size.  # summer-/fall 2019-Bil PE/kidney infract; factor V Leiden Leiden - xarelto [small bowel obstruction]; moved to [facility]   #Fall 2019 PE/renal infarct on Xarelto-factor V Leiden  ------------------------------------------------------------- She was treated by  Decision was made to pursue concurrent chemotherapy (weekly cisplatin) and radiation.  She received treatment from 11/2016-05/2017.  01/2017 cisplatin x 2 and carboplatin x 1 (01/29/2017) due to ARF and XRT.  XRT was followed by T & O on 02/01/2017 and T & N 02/10/2017 and 02/20/2017.  Course was complicated by 80 pound weight loss, nausea, vomiting, electrolyte wasting (potassium and magnesium).  She describes that.  Is been sick constantly requiring at least 20 hospitalizations.  Follow-up CT chest and PET on 06/2017. Per patient, 'radiation worked' and no disease in the abdomen.  At that time she was noted to have lung nodules that were growing and follow-up imaging was scheduled for 10/2017.  She was admitted to hospital in [place] for small bowel obstruction which was managed conservatively and she was home for a week prior to traveling to [place] for Thanksgiving holiday where she has family.  She presented to  ER in [place] on 08/2017 with nausea, vomiting, and lower abdominal pain.  Symptoms did not respond to conservative treatment.  CT on 08/26/2017 revealed small bowel obstruction with transition in the pelvis just superior to the uterus rather was a long segment of distal ileum with fatty wall thickening compatible with chronic inflammation and/or radiation enteritis. Imaging showed numerous pulmonary nodules consistent with metastatic disease. She underwent laparotomy and right ileocolectomy on 08/31/2017 at [facility].  Surgical findings revealed a thickened, matted, and scarred piece of distal small bowel close to the ileocecal valve.   She was discharged on 09/05/2017.  Pain markedly increased in intensity and imaging was performed on 09/11/2017 which revealed: Debris within the anterior abdominal wall incision concerning for infection versus packing material, s/p post ileo-colectomy with expected postoperative changes, mild colonic ileus, numerous pulmonary nodules highly concerning for metastatic disease, punctate nonobstructing nephrolithiasis.  Staples were removed and one was packed.  She was started on doxycycline.  Abdominal and pelvic CT without contrast on 09/11/2017 revealed debris within anterior abdominal wall incision concerning for infection, versus packing material.She is s/pileocolectomy with expected postoperative changes and mild colonic ileus. There were numerous pulmonary nodules highly concerning for metastatic diseaseand punctate nonobstructing nephrolithiasis. She was readmitted on 09/12/2017. She describes the onset of lower abdominal pain on 09/09/2017. Pain markedly increased in intensity on 09/11/2017.  Staples were removed and the wound packed. She was started on doxycycline.  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  PET scan was ordered to evaluate enlarging lung nodules with concern for recurrent cervical cancer but scan was delayed due to insurance and need to be performed in [place].  Presented to ER on 12/03/2017 for abdominal pain and emesis.  Imaging concerning for worsening possible uterine infarct and she was admitted to hospital.  Pelvic MRI was unremarkable.  Remote scarring type changes of uterus  thought to be possibly related to radiation.  She was discharged on 12/08/2017.  Underwent endoscopy and colonoscopy on 12/20/2017.    On 02/22/2018 she underwent laser ablation of condylomata around the anus and vagina under anesthesia with Dr. Johney Maine.   04/15/2018: Chest, abdomen, and pelvis CTrevealed innumerable (>100) cavitary nodules scattered in the lungs, moderately enlarging compared to the 11/08/2017 PET-CT, suspicious for metastatic disease. One index node in the RLL measures 1.0 x 1.1 cm (previously 0.6 x 0.6 cm). There were no new nodules. There was an ill-defined wall thickening in the rectosigmoid with surrounding stranding along fascia planes, probably sequela from prior radiation therapy. There was multilevel lumbar impingement due to spondylosis and degenerative disc disease. There was heterogeneous enhancement in the uterus (some possibly from prior radiation therapy).  04/23/2018:PET scan revealednumerous scattered solid and cavitary nodules in the lungs stable increased in size compared to the prior PET-CT from 11/08/2017.Largest nodule was 1.1 cm in the LUL (SUV 1.9).These demonstratedlow-grade metabolic activity up to a maximum SUV of about 2.3, increased from 11/08/2017.   Case was discussed at tumor board on 04/25/2018. Consensus to pursue CT-guided biopsy (05/06/18) which revealed: Metastatic adenocarcinoma, morphologically consistent with cervical adenocarcinoma.  She has history of chronic hepatitis C which is managed by GI.  Hepatitis C genotype is 2a/2c.  She receives B12 injections for history of B12 deficiency.  On 04/24/2018 she underwent left total knee replacement with Dr. [name]. --------------------------------------------------------   DIAGNOSIS: Cervical adenocarcinoma  STAGE: IV        ;GOALS: Palliative  CURRENT/MOST RECENT THERAPY : Carbotaxol      Malignant neoplasm of overlapping sites of cervix (HCC)    ALLERGIES:  is allergic to  ketamine.  MEDICATIONS:  Current Outpatient Medications  Medication Sig Dispense Refill   amitriptyline (ELAVIL) 75 MG tablet Take 1 tablet (75 mg total) by mouth at bedtime. 90 tablet 1   budesonide (ENTOCORT EC) 3 MG 24 hr capsule Take 3 capsules (9 mg total) by mouth daily. 30 capsule 0   Calcium Carb-Cholecalciferol (CALCIUM 500 +D) 500-400 MG-UNIT TABS Take 2 tablets by mouth daily.     diphenoxylate-atropine (LOMOTIL) 2.5-0.025 MG tablet Take 2 tablets by mouth 4 (four) times daily. (Patient taking differently: Take 4 tablets by mouth 4 (four) times daily. ) 30 tablet 0   famotidine (PEPCID) 20 MG tablet Take 1 tablet (20 mg total) by mouth 2 (two) times daily as needed for heartburn or indigestion.     hydrocortisone (CORTEF) 10 MG tablet Take 1 tablet (10 mg total) by mouth 2 (two) times daily. 180 tablet 0   hyoscyamine (LEVBID) 0.375 MG 12 hr tablet Take 1 tablet (0.375 mg total)by mouth 2 (two) times daily. 60 tablet 0   Multiple Vitamins-Minerals (MULTIVITAMIN WITH MINERALS) tablet Take 1 tablet by mouth daily. 30 tablet 0   ondansetron (ZOFRAN ODT) 4 MG disintegrating tablet Take 1 tablet (4 mg total) by mouth every 8 (eight) hours as needed for nausea or vomiting. 20 tablet 0   ondansetron (ZOFRAN) 8 MG tablet Take 1 tablet (8 mg total) by mouth every 8 (eight) hours as needed for nausea or vomiting. 30 tablet 1   oxyCODONE (OXYCONTIN) 15 mg 12 hr tablet Take 1 tablet (15 mg total) by mouth every 12 (twelve) hours. 30 tablet 0   Oxycodone HCl 10 MG TABS Take 1 tablet (10 mg total) by mouth every 6 (six) hours as needed. 60 tablet 0   pantoprazole (PROTONIX) 40 MG tablet Take 1 tablet (40 mg total) by mouth daily. 90 tablet 0   potassium chloride SA (K-DUR,KLOR-CON) 20 MEQ tablet Take 1 tablet (20 mEq total) by mouth daily. 30 tablet 3   promethazine (PHENERGAN) 25 MG tablet Take 1 tablet (25 mg total) by mouth every 8 (eight) hours as needed for nausea or vomiting. 30  tablet 3   rivaroxaban (XARELTO) 20 MG TABS tablet Take 20 mg by mouth daily with supper.     VENTOLIN HFA 108 (90 Base) MCG/ACT inhaler Inhale 1-2 puffs into the lungs every 4 (four) hours as needed for shortness of breath.   0   VIBERZI 100 MG TABS TAKE 1 TABLET BY MOUTH TWICE DAILY WITH  A  MEAL 60 tablet 0   No current facility-administered medications for this visit.    Facility-Administered Medications Ordered in Other Visits  Medication Dose Route Frequency Provider Last Rate Last Dose   0.9 %  sodium chloride infusion   Intravenous Continuous Cammie Sickle, MD   Stopped at 01/15/19 1224   heparin lock flush 100 unit/mL  500 Units Intravenous Once Corcoran, Melissa C, MD       sodium chloride 0.9 % 1,000 mL with potassium chloride 20 mEq infusion   Intravenous Once Karen Kitchens, NP  VITAL SIGNS: There were no vitals taken for this visit. There were no vitals filed for this visit.  Estimated body mass index is 25.7 kg/m as calculated from the following:   Height as of 01/01/19: 5' 9"  (1.753 m).   Weight as of an earlier encounter on 01/15/19: 174 lb (78.9 kg).  LABS: CBC:    Component Value Date/Time   WBC 4.3 01/15/2019 0845   HGB 9.4 (L) 01/15/2019 0845   HCT 27.5 (L) 01/15/2019 0845   PLT 77 (L) 01/15/2019 0845   MCV 103.8 (H) 01/15/2019 0845   NEUTROABS 3.6 01/15/2019 0845   LYMPHSABS 0.5 (L) 01/15/2019 0845   MONOABS 0.2 01/15/2019 0845   EOSABS 0.0 01/15/2019 0845   BASOSABS 0.0 01/15/2019 0845   Comprehensive Metabolic Panel:    Component Value Date/Time   NA 136 01/15/2019 0845   K 3.2 (L) 01/15/2019 0845   CL 106 01/15/2019 0845   CO2 23 01/15/2019 0845   BUN 6 01/15/2019 0845   CREATININE 0.62 01/15/2019 0845   GLUCOSE 98 01/15/2019 0845   CALCIUM 8.6 (L) 01/15/2019 0845   AST 30 01/15/2019 0845   ALT 32 01/15/2019 0845   ALKPHOS 190 (H) 01/15/2019 0845   BILITOT 0.5 01/15/2019 0845   PROT 7.3 01/15/2019 0845   ALBUMIN 3.5  01/15/2019 0845    RADIOGRAPHIC STUDIES: Dg Abdomen Acute W/chest  Result Date: 12/26/2018 CLINICAL DATA:  Chest pain starting in our ago. Nausea, vomiting, and diarrhea. EXAM: DG ABDOMEN ACUTE W/ 1V CHEST COMPARISON:  Abdomen 11/26/2018 FINDINGS: Power port type central venous catheter with tip over the low SVC region. Normal heart size and pulmonary vascularity. No focal airspace disease or consolidation in the lungs. No blunting of costophrenic angles. No pneumothorax. Mediastinal contours appear intact. Degenerative changes in the spine and shoulders. Scattered gas and stool throughout the colon. No small or large bowel distention. No free intra-abdominal air. No abnormal air-fluid levels. Surgical clips in the right upper quadrant and right mid abdomen. Calcified phleboliths in the pelvis. No radiopaque stones. Degenerative changes in the spine. Soft tissue contours appear intact. IMPRESSION: No evidence of active pulmonary disease. Normal nonobstructive bowel gas pattern. Electronically Signed   By: Lucienne Capers M.D.   On: 12/26/2018 00:12   Ct Angio Abd/pel W And/or Wo Contrast  Result Date: 12/16/2018 CLINICAL DATA:  53 year old with abdominal pain. Abdominal pain is worse with bowel movements. History of cervical cancer. EXAM: CT ANGIOGRAPHY ABDOMEN AND PELVIS WITH CONTRAST AND WITHOUT CONTRAST TECHNIQUE: Multidetector CT imaging of the abdomen and pelvis was performed using the standard protocol during bolus administration of intravenous contrast. Multiplanar reconstructed images and MIPs were obtained and reviewed to evaluate the vascular anatomy. CONTRAST:  119m OMNIPAQUE IOHEXOL 350 MG/ML SOLN COMPARISON:  CT abdomen pelvis 11/26/2018 FINDINGS: VASCULAR Aorta: Normal caliber of the abdominal aorta with atherosclerotic disease. Negative for an aortic dissection. Celiac: Patent without evidence of aneurysm, dissection, vasculitis or significant stenosis. Mild atherosclerotic disease. SMA:  Patent without evidence of aneurysm, dissection, vasculitis or significant stenosis. Renals: Both renal arteries are patent without evidence of aneurysm, dissection, vasculitis, fibromuscular dysplasia or significant stenosis. IMA: IMA is patent. Inflow: Patent without evidence of aneurysm, dissection, vasculitis or significant stenosis. Proximal Outflow: Proximal femoral arteries are patent bilaterally. Veins: Portal venous system is patent. Normal appearance of the IVC and renal veins. Iliac veins are patent. Review of the MIP images confirms the above findings. NON-VASCULAR Lower chest: Again noted are innumerable pulmonary nodules compatible with  metastatic disease. Many of the lesions have central cavitation. No pleural effusions. Hepatobiliary: Cholecystectomy. Chronic extrahepatic biliary dilatation measuring up to 1.4 cm and minimally changed. Main portal venous system is patent. Mild intrahepatic biliary dilatation. No suspicious liver lesions. Pancreas: Unremarkable. No pancreatic ductal dilatation or surrounding inflammatory changes. Spleen: Normal in size without focal abnormality. Adrenals/Urinary Tract: Normal adrenal glands. Normal appearance of both kidneys without hydronephrosis. Difficult to exclude mild bladder wall thickening but minimally changed from the previous examination. Stomach/Bowel: Normal appearance of the stomach and duodenum. Right hemicolectomy. Mucosal enhancement and wall thickening involving the rectum and sigmoid colon. There is no significant small bowel wall thickening. Negative for a bowel obstruction. Lymphatic: No lymph node enlargement in the abdomen or pelvis. Reproductive: Stable appearance of the uterus and adnexal structures. Other: Stable stranding or edema in the pelvic region, including the presacral region. These findings are similar to the previous examination. Negative for free air. Musculoskeletal: Significant disc space narrowing and endplate disease at P5-T6  and L4-L5. Disc space narrowing at L1-L2. IMPRESSION: VASCULAR 1. Mild atherosclerotic disease the aorta and visceral arteries. No significant visceral artery stenosis. Negative for an aortic aneurysm. 2. Venous structures are unremarkable. NON-VASCULAR 1. Mucosal enhancement and wall thickening in the rectum and sigmoid colon. Findings are similar to the previous examination and probably represent chronic inflammation related to cervical cancer treatment. Stable edema or stranding in the pelvis. 2. Right hemicolectomy. 3. Innumerable pulmonary nodules compatible with metastatic disease. 4. Stable biliary dilatation, status post cholecystectomy. Electronically Signed   By: Markus Daft M.D.   On: 12/16/2018 14:48    PERFORMANCE STATUS (ECOG) : 1 - Symptomatic but completely ambulatory  REVIEW OF SYSTEMS: As noted above. Otherwise, a complete review of systems is negative.  PHYSICAL EXAM: General: NAD, frail appearing, thin Pulmonary: Unlabored Extremities: no edema Skin: no rashes Neurological: Weakness but otherwise nonfocal  IMPRESSION: Patient was seen today for routine follow-up.  Patient reports that her pain is stable.  She has been started on OxyContin 15 mg every 12 hours and continues to take oxycodone 10 mg every 6 hours as needed for breakthrough pain.  Patient continues to have persistent diarrhea unrelieved with 4 times daily dosing of Lomotil.  I had questioned if diarrhea with associated with acute opioid withdrawal but this seems less likely as patient is now taking long-acting opioids and still endorses diarrhea.  Stool studies have been unrevealing.  She has been previously followed by Dr. Marius Ditch but does not currently appear to have follow-up scheduled. She does say she had some improvement with octreotide, which was administered in the clinic.  Could consider continuing octreotide.  Patient otherwise reports doing reasonably well.  We discussed COVID-19 and ways to mitigate  transmission including social distancing and hand hygiene.  PLAN: -Continue current scope of treatment -Continue oxycodone 10 mg every 6 hours as needed for pain  -Continue OxyContin 15 mg every 12 hours -Plan random UDS -Will plan to complete MOST form during future visit -RTC 1 month   Patient expressed understanding and was in agreement with this plan. She also understands that She can call clinic at any time with any questions, concerns, or complaints.     Time Total: 15 minutes  Visit consisted of counseling and education dealing with the complex and emotionally intense issues of symptom management and palliative care in the setting of serious and potentially life-threatening illness.Greater than 50%  of this time was spent counseling and coordinating care related to the  above assessment and plan.  Signed by: Altha Harm, PhD, NP-C [phone] (Work Cell)

## 2019-01-15 NOTE — Progress Notes (Signed)
[facility] NOTE  Patient Care Team: Lada, Satira Anis, MD as PCP - General (Family Medicine) Mellody Drown, MD as Consulting Physician (Obstetrics and Gynecology) Lenor Coffin, MD as Attending Physician (Gynecology) Lequita Asal, MD as Medical Oncologist (Hematology and Oncology) [name], MD as Consulting Physician (Gastroenterology) Michael Boston, MD as Consulting Physician (General Surgery) Lovell Sheehan, MD as Consulting Physician (Orthopedic Surgery) Clent Jacks, RN as Registered Nurse  CHIEF COMPLAINTS/PURPOSE OF CONSULTATION: Cervical cancer  #  Oncology History   # dec 2017- CERVICAL AD[place] CA [[place]]; Stage IB [Dr. Christene Slates at [facility] in [place], [place];No surgery- Chemo-RT;  # 2018-sep - lung nodules [in [place]]  # AUG 20th, 2019-#1 Carbo-Taxol s/p 6 cycles- [No avastin sec to blood clots]: Feb 2020-bilateral lung nodules with 1 cm in size.  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  She received treatment from 11/2016-05/2017.  01/2017 cisplatin x 2 and carboplatin x 1 (01/29/2017) due to ARF and XRT.  XRT was followed by T & O on 02/01/2017 and T & N 02/10/2017 and 02/20/2017.  Course was complicated by 80 pound weight loss, nausea, vomiting, electrolyte wasting (potassium and magnesium).  She describes that.  Is been sick constantly requiring at least 20 hospitalizations.  Follow-up CT chest and PET on 06/2017. Per patient, 'radiation worked' and no disease in the abdomen.  At that time she was noted to have lung nodules that were growing and follow-up imaging was scheduled for  10/2017.  She was admitted to hospital in [place] for small bowel obstruction which was managed conservatively and she was home for a week prior to traveling to [place] for Thanksgiving holiday where she has family.  She presented to ER in [place] on 08/2017 with nausea, vomiting, and lower abdominal pain.  Symptoms did not respond to conservative treatment.  CT on 08/26/2017 revealed small bowel obstruction with transition in the pelvis just superior to the uterus rather was a long segment of distal ileum with fatty wall thickening compatible with chronic inflammation and/or radiation enteritis. Imaging showed numerous pulmonary nodules consistent with metastatic disease. She underwent laparotomy and right ileocolectomy on 08/31/2017 at [facility].  Surgical findings revealed a thickened, matted, and scarred piece of distal small bowel close to the ileocecal valve.  She was discharged on 09/05/2017.  Pain markedly increased in intensity and imaging was performed on 09/11/2017 which revealed: Debris within the anterior abdominal wall incision concerning for infection versus packing material, s/p post ileo-colectomy with expected postoperative changes, mild colonic ileus, numerous pulmonary nodules highly concerning for metastatic disease, punctate nonobstructing nephrolithiasis.  Staples were removed and one was packed.  She was started on doxycycline.  Abdominal and pelvic CT without contrast on 09/11/2017 revealed debris within anterior abdominal wall incision concerning for infection, versus packing material.She is s/pileocolectomy with expected postoperative changes and mild colonic ileus. There were numerous pulmonary nodules highly concerning for metastatic diseaseand punctate nonobstructing nephrolithiasis. She was readmitted on 09/12/2017. She describes the onset of lower abdominal pain on 09/09/2017. Pain markedly increased in intensity on 09/11/2017.  Staples were removed  and the wound packed. She was started on doxycycline.  CT on 09/13/2017 showed postsurgical changes from ileocecectomy with primary  ileocolic anastomosis without evidence of abscess or leak, edema small bowel loops of distal ileum, gas within ventral midline surgical wound corresponding to wound infection versus packing material, small infarct at the inferior pole of left kidney, right uterine infarct.  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  PET scan was ordered to evaluate enlarging lung nodules with concern for recurrent cervical cancer but scan was delayed due to insurance and need to be performed in [place].  Presented to ER on 12/03/2017 for abdominal pain and emesis.  Imaging concerning for worsening possible uterine infarct and she was admitted to hospital.  Pelvic MRI was unremarkable.  Remote scarring type changes of uterus thought to be possibly related to radiation.  She was discharged on 12/08/2017.  Underwent endoscopy and colonoscopy on 12/20/2017.    On 02/22/2018 she underwent laser ablation of condylomata around the anus and vagina under anesthesia with Dr. Johney Maine.   04/15/2018: Chest, abdomen, and pelvis CTrevealed innumerable (>100) cavitary nodules scattered in the lungs, moderately enlarging compared to the 11/08/2017 PET-CT, suspicious for metastatic disease. One index node in the RLL measures 1.0 x 1.1 cm (previously 0.6 x 0.6 cm). There were no new nodules. There was an ill-defined wall thickening in the rectosigmoid with surrounding stranding along fascia planes, probably sequela from prior radiation therapy. There was multilevel lumbar impingement due to spondylosis and degenerative disc disease. There was heterogeneous enhancement in the uterus (some possibly from prior radiation therapy).  04/23/2018:PET scan revealednumerous scattered solid and cavitary nodules in the lungs stable increased in size compared to the prior  PET-CT from 11/08/2017.Largest nodule was 1.1 cm in the LUL (SUV 1.9).These demonstratedlow-grade metabolic activity up to a maximum SUV of about 2.3, increased from 11/08/2017.   Case was discussed at tumor board on 04/25/2018. Consensus to pursue CT-guided biopsy (05/06/18) which revealed: Metastatic adenocarcinoma, morphologically consistent with cervical adenocarcinoma.  She has history of chronic hepatitis C which is managed by GI.  Hepatitis C genotype is 2a/2c.  She receives B12 injections for history of B12 deficiency.  On 04/24/2018 she underwent left total knee replacement with Dr. [name]. --------------------------------------------------------   DIAGNOSIS: Cervical adenocarcinoma  STAGE: IV        ;GOALS: Palliative  CURRENT/MOST RECENT THERAPY : Carbotaxol      Malignant neoplasm of overlapping sites of cervix ([place])     HISTORY OF PRESENTING ILLNESS:  Joanna Hall 53 y.o.  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   Post last negative chemotherapy patient was evaluated symptomatically for abdominal pain diarrhea.  Work-up for infectious causes negative.  Patient is with IV fluids.  Otherwise patient today feels improved.  Denies any worsening nausea vomiting.  Continues have abdominal pain for which she takes oxycodone.  Ongoing fatigue.   Review of Systems  Constitutional: Positive for malaise/fatigue. Negative for chills, diaphoresis, fever and weight loss.  HENT: Negative for nosebleeds and sore throat.   Eyes: Negative for double vision.  Respiratory: Negative for cough, hemoptysis, sputum production, shortness of breath and wheezing.   Cardiovascular: Negative for chest pain, palpitations, orthopnea and leg swelling.  Gastrointestinal: Positive for abdominal pain. Negative for blood in stool, constipation, diarrhea, heartburn, melena, nausea and vomiting.  Genitourinary:  Negative for dysuria, frequency and urgency.  Musculoskeletal: Negative for back pain and joint pain.  Skin: Negative.  Negative for itching and rash.  Neurological:  Negative for dizziness, tingling, focal weakness, weakness and headaches.  Endo/Heme/Allergies: Does not bruise/bleed easily.  Psychiatric/Behavioral: Negative for depression. The patient is not nervous/anxious and does not have insomnia.      MEDICAL HISTORY:  Past Medical History:  Diagnosis Date  . Abdominal pain 06/10/2018  . Abnormal cervical Papanicolaou smear 09/18/2017  . Anxiety   . Aortic atherosclerosis ([place])   . Arthritis    neck and knees  . 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 Blood transfusion without reported diagnosis   . Cervical cancer ([place]) 09/2016   mets lung  . Chronic anal fissure   . Chronic diarrhea   . Dyspnea   . Erosive gastropathy 09/18/2017  . Factor V Leiden mutation ([place])   . Fecal incontinence   . Genital warts   . GERD (gastroesophageal reflux disease)   . Heart murmur   . Hematochezia   . Hemorrhoids   . 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 Hepatitis, chronic ([place]) 05/05/2017  . History of cancer chemotherapy    completed 06/2017  . History of Clostridium difficile infection    while undergoing chemo.  Negative test 10/2017  . Ileocolic anastomotic leak   . 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 Intestinal infection due to Clostridium difficile 09/18/2017  . Macrocytic anemia with vitamin B12 deficiency   . Multiple gastric ulcers   . Nausea vomiting and diarrhea   . Pancolitis ([place]) 07/27/2018  . Perianal condylomata   . Pneumonia    History of  . Pulmonary nodules   . Rectal bleeding   . Small bowel obstruction ([place]) 08/2017  . Stiff neck    limited right turn  . Vitamin D deficiency     SURGICAL HISTORY: Past Surgical History:  Procedure Laterality Date  . CHOLECYSTECTOMY    . COLON SURGERY  08/2017    resection  . COLONOSCOPY WITH PROPOFOL N/A 12/20/2017   Procedure: COLONOSCOPY WITH PROPOFOL;  Surgeon: [name], MD;  Location[facility] ENDOSCOPY;  Service: Gastroenterology;  Laterality: N/A;  . COLONOSCOPY WITH PROPOFOL N/A 07/30/2018   Procedure: COLONOSCOPY WITH PROPOFOL;  Surgeon: [name], MD;  Location: [facility] ENDOSCOPY;  Service: Gastroenterology;  Laterality: N/A;  . COLONOSCOPY WITH PROPOFOL N/A 10/10/2018   Procedure: COLONOSCOPY WITH PROPOFOL;  Surgeon: Lucilla Lame, MD;  Location: [facility] ENDOSCOPY;  Service: Endoscopy;  Laterality: N/A;  . DIAGNOSTIC LAPAROSCOPY    . ESOPHAGOGASTRODUODENOSCOPY (EGD) WITH PROPOFOL N/A 12/20/2017   Procedure: ESOPHAGOGASTRODUODENOSCOPY (EGD) WITH PROPOFOL;  Surgeon: [name], MD;  Location: [place];  Service: Gastroenterology;  Laterality: N/A;  . ESOPHAGOGASTRODUODENOSCOPY (EGD) WITH PROPOFOL  07/30/2018   Procedure: ESOPHAGOGASTRODUODENOSCOPY (EGD) WITH PROPOFOL;  Surgeon: [name], MD;  Location: [facility] ENDOSCOPY;  Service: Gastroenterology;;  . Otho Darner SIGMOIDOSCOPY N/A 11/21/2018   Procedure: FLEXIBLE SIGMOIDOSCOPY;  Surgeon: [name], MD;  Location: [facility] ENDOSCOPY;  Service: Gastroenterology;  Laterality: N/A;  . LAPAROTOMY N/A 08/31/2017   Procedure: EXPLORATORY LAPAROTOMY for SBO, ileocolectomy, removal of piece of uterine wall;  Surgeon: [name], MD;  Location: [facility] ORS;  Service: General;  Laterality: N/A;  . LASER ABLATION CONDOLAMATA N/A 02/22/2018   Procedure: LASER ABLATION/REMOVAL OF [id number] AROUND ANUS AND VAGINA;  Surgeon: Michael Boston, MD;  Location: [place];  Service: General;  Laterality: N/A;  . OOPHORECTOMY    . PORTA CATH INSERTION N/A  05/13/2018   Procedure: PORTA CATH INSERTION;  Surgeon: Katha Cabal, MD;  Location: [place] CV LAB;  Service: Cardiovascular;  Laterality: N/A;  . SMALL INTESTINE SURGERY    . TANDEM RING INSERTION      x3  . THORACOTOMY    . TOTAL KNEE ARTHROPLASTY Left 04/24/2018   Procedure: TOTAL KNEE ARTHROPLASTY;  Surgeon: Lovell Sheehan, MD;  Location: [facility] ORS;  Service: Orthopedics;  Laterality: Left;    SOCIAL HISTORY: Social History   Socioeconomic History  . Marital status: Divorced    Spouse name: Not on file  . Number of children: Not on file  . Years of education: Not on file  . Highest education level: Not on file  Occupational History  . Not on file  Social Needs  . Financial resource strain: Not hard at all  . Food insecurity:    Worry: Never true    Inability: Never true  . Transportation needs:    Medical: No    Non-medical: No  Tobacco Use  . Smoking status: Former Smoker    Last attempt to quit: 10/16/2006    Years since quitting: 12.2  . Smokeless tobacco: Never Used  Substance and Sexual Activity  . Alcohol use: Not Currently    Frequency: Never    Comment: seldom  . Drug use: Yes    Types: Marijuana  . Sexual activity: Not Currently    Birth control/protection: Post-menopausal    Comment: Not Asked  Lifestyle  . Physical activity:    Days per week: Patient refused    Minutes per session: Patient refused  . Stress: Only a little  Relationships  . Social connections:    Talks on phone: Patient refused    Gets together: Patient refused    Attends religious service: Patient refused    Active member of club or organization: Patient refused    Attends meetings of clubs or organizations: Patient refused    Relationship status: Patient refused  . Intimate partner violence:    Fear of current or ex partner: No    Emotionally abused: No    Physically abused: No    Forced sexual activity: No  Other Topics Concern  . Not on file  Social History Narrative  . Not on file    FAMILY HISTORY: Family History  Problem Relation Age of Onset  . Hypertension Father   . Diabetes Father   . Alcohol abuse Daughter   . Hypertension Maternal Grandmother   . Diabetes  Maternal Grandmother   . Diabetes Paternal Grandmother   . Hypertension Paternal Grandmother     ALLERGIES:  is allergic to ketamine.  MEDICATIONS:  Current Outpatient Medications  Medication Sig Dispense Refill  . amitriptyline (ELAVIL) 75 MG tablet Take 1 tablet (75 mg total) by mouth at bedtime. 90 tablet 1  . budesonide (ENTOCORT EC) 3 MG 24 hr capsule Take 3 capsules (9 mg total) by mouth daily. 30 capsule 0  . Calcium Carb-Cholecalciferol (CALCIUM 500 +D) 500-400 MG-UNIT TABS Take 2 tablets by mouth daily.    . diphenoxylate-atropine (LOMOTIL) 2.5-0.025 MG tablet Take 2 tablets by mouth 4 (four) times daily. (Patient taking differently: Take 4 tablets by mouth 4 (four) times daily. ) 30 tablet 0  . famotidine (PEPCID) 20 MG tablet Take 1 tablet (20 mg total) by mouth 2 (two) times daily as needed for heartburn or indigestion.    . hydrocortisone (CORTEF) 10 MG tablet Take 1 tablet (10 mg total) by mouth  2 (two) times daily. 180 tablet 0  . hyoscyamine (LEVBID) 0.375 MG 12 hr tablet Take 1 tablet (0.375 mg total)by mouth 2 (two) times daily. 60 tablet 0  . Multiple Vitamins-Minerals (MULTIVITAMIN WITH MINERALS) tablet Take 1 tablet by mouth daily. 30 tablet 0  . ondansetron (ZOFRAN ODT) 4 MG disintegrating tablet Take 1 tablet (4 mg total) by mouth every 8 (eight) hours as needed for nausea or vomiting. 20 tablet 0  . ondansetron (ZOFRAN) 8 MG tablet Take 1 tablet (8 mg total) by mouth every 8 (eight) hours as needed for nausea or vomiting. 30 tablet 1  . oxyCODONE (OXYCONTIN) 15 mg 12 hr tablet Take 1 tablet (15 mg total) by mouth every 12 (twelve) hours. 30 tablet 0  . Oxycodone HCl 10 MG TABS Take 1 tablet (10 mg total) by mouth every 6 (six) hours as needed. 60 tablet 0  . pantoprazole (PROTONIX) 40 MG tablet Take 1 tablet (40 mg total) by mouth daily. 90 tablet 0  . potassium chloride SA (K-DUR,KLOR-CON) 20 MEQ tablet Take 1 tablet (20 mEq total) by mouth daily. 30 tablet 3  .  promethazine (PHENERGAN) 25 MG tablet Take 1 tablet (25 mg total) by mouth every 8 (eight) hours as needed for nausea or vomiting. 30 tablet 3  . rivaroxaban (XARELTO) 20 MG TABS tablet Take 20 mg by mouth daily with supper.    . VENTOLIN HFA 108 (90 Base) MCG/ACT inhaler Inhale 1-2 puffs into the lungs every 4 (four) hours as needed for shortness of breath.   0  . VIBERZI 100 MG TABS TAKE 1 TABLET BY MOUTH TWICE DAILY WITH  A  MEAL 60 tablet 0   No current facility-administered medications for this visit.    Facility-Administered Medications Ordered in Other Visits  Medication Dose Route Frequency Provider Last Rate Last Dose  . heparin lock flush 100 unit/mL  500 Units Intravenous Once Corcoran, Melissa C, MD      . sodium chloride 0.9 % 1,000 mL with potassium chloride 20 mEq infusion   Intravenous Once Karen Kitchens, NP          .  PHYSICAL EXAMINATION: ECOG PERFORMANCE STATUS: 1 - Symptomatic but completely ambulatory  Vitals:   01/15/19 0930  BP: 134/86  Pulse: 98  Resp: 16  Temp: 97.8 F (36.6 C)   Filed Weights   01/15/19 0930  Weight: 174 lb (78.9 kg)    Physical Exam  Constitutional: She is oriented to person, place, and time and well-developed, well-nourished, and in no distress.  Alone.  Walking herself.  HENT:  Head: Normocephalic and atraumatic.  Mouth/Throat: Oropharynx is clear and moist. No oropharyngeal exudate.  Eyes: Pupils are equal, round, and reactive to light.  Neck: Normal range of motion. Neck supple.  Cardiovascular: Normal rate and regular rhythm.  Pulmonary/Chest: Effort normal and breath sounds normal. No respiratory distress. She has no wheezes.  Abdominal: Soft. Bowel sounds are normal. She exhibits no distension and no mass. There is no abdominal tenderness. There is no rebound and no guarding.  Musculoskeletal: Normal range of motion.        General: No tenderness or edema.  Neurological: She is alert and oriented to person, place, and  time.  Skin: Skin is warm.  Psychiatric: Affect normal.     LABORATORY DATA:  I have reviewed the data as listed Lab Results  Component Value Date   WBC 4.3 01/15/2019   HGB 9.4 (L) 01/15/2019   HCT  27.5 (L) 01/15/2019   MCV 103.8 (H) 01/15/2019   PLT 77 (L) 01/15/2019   Recent Labs    06/11/18 0510  07/08/18 1035  10/21/18 0845  12/26/18 1034 01/01/19 0831 01/06/19 1025 01/15/19 0845  NA 135   < > 136   < > 134*   < > 136 135 131* 136  K 3.9   < > 4.2   < > 3.9   < > 3.9 3.9 3.9 3.2*  CL 102   < > 107   < > 98   < > 105 104 96* 106  CO2 27   < > 22   < > 24   < > 24 25 22 23   GLUCOSE 82   < > 119*   < > 111*   < > 96 109* 145* 98  BUN 11   < > 15   < > 17   < > 11 17 23* 6  CREATININE 0.63   < > 0.71   < > 0.91   < > 0.76 0.80 0.90 0.62  CALCIUM 8.9   < > 8.7*   < > 9.6   < > 8.6* 8.9 9.6 8.6*  GFRNONAA >60   < > >60   < > >60   < > >60 >60 >60 >60  GFRAA >60   < > >60   < > >60   < > >60 >60 >60 >60  PROT 7.0   < > 6.5   < > 8.6*   < > 7.8 7.7  --  7.3  ALBUMIN 3.5   < > 3.4*   < > 4.4   < > 3.8 3.7  --  3.5  AST 41   < > 26   < > 48*   < > 31 32  --  30  ALT 51*   < > 33   < > 67*   < > 47* 39  --  32  ALKPHOS 171*   < > 132*   < > 185*   < > 186* 167*  --  190*  BILITOT 0.7   < > 0.4   < > 1.4*   < > 0.5 0.4  --  0.5  BILIDIR 0.1  --  <0.1  --  0.3*  --   --   --   --   --   IBILI 0.6  --  NOT CALCULATED  --   --   --   --   --   --   --    < > = values in this interval not displayed.    RADIOGRAPHIC STUDIES: I have personally reviewed the radiological images as listed and agreed with the findings in the report. Dg Abdomen Acute W/chest  Result Date: 12/26/2018 CLINICAL DATA:  Chest pain starting in our ago. Nausea, vomiting, and diarrhea. EXAM: DG ABDOMEN ACUTE W/ 1V CHEST COMPARISON:  Abdomen 11/26/2018 FINDINGS: Power port type central venous catheter with tip over the low SVC region. Normal heart size and pulmonary vascularity. No focal airspace disease or  consolidation in the lungs. No blunting of costophrenic angles. No pneumothorax. Mediastinal contours appear intact. Degenerative changes in the spine and shoulders. Scattered gas and stool throughout the colon. No small or large bowel distention. No free intra-abdominal air. No abnormal air-fluid levels. Surgical clips in the right upper quadrant and right mid abdomen. Calcified phleboliths in the pelvis. No radiopaque stones. Degenerative changes in the spine.  Soft tissue contours appear intact. IMPRESSION: No evidence of active pulmonary disease. Normal nonobstructive bowel gas pattern. Electronically Signed   By: Lucienne Capers M.D.   On: 12/26/2018 00:12   Ct Angio Abd/pel W And/or Wo Contrast  Result Date: 12/16/2018 CLINICAL DATA:  53 year old with abdominal pain. Abdominal pain is worse with bowel movements. History of cervical cancer. EXAM: CT ANGIOGRAPHY ABDOMEN AND PELVIS WITH CONTRAST AND WITHOUT CONTRAST TECHNIQUE: Multidetector CT imaging of the abdomen and pelvis was performed using the standard protocol during bolus administration of intravenous contrast. Multiplanar reconstructed images and MIPs were obtained and reviewed to evaluate the vascular anatomy. CONTRAST:  177m OMNIPAQUE IOHEXOL 350 MG/ML SOLN COMPARISON:  CT abdomen pelvis 11/26/2018 FINDINGS: VASCULAR Aorta: Normal caliber of the abdominal aorta with atherosclerotic disease. Negative for an aortic dissection. Celiac: Patent without evidence of aneurysm, dissection, vasculitis or significant stenosis. Mild atherosclerotic disease. SMA: Patent without evidence of aneurysm, dissection, vasculitis or significant stenosis. Renals: Both renal arteries are patent without evidence of aneurysm, dissection, vasculitis, fibromuscular dysplasia or significant stenosis. IMA: IMA is patent. Inflow: Patent without evidence of aneurysm, dissection, vasculitis or significant stenosis. Proximal Outflow: Proximal femoral arteries are patent  bilaterally. Veins: Portal venous system is patent. Normal appearance of the IVC and renal veins. Iliac veins are patent. Review of the MIP images confirms the above findings. NON-VASCULAR Lower chest: Again noted are innumerable pulmonary nodules compatible with metastatic disease. Many of the lesions have central cavitation. No pleural effusions. Hepatobiliary: Cholecystectomy. Chronic extrahepatic biliary dilatation measuring up to 1.4 cm and minimally changed. Main portal venous system is patent. Mild intrahepatic biliary dilatation. No suspicious liver lesions. Pancreas: Unremarkable. No pancreatic ductal dilatation or surrounding inflammatory changes. Spleen: Normal in size without focal abnormality. Adrenals/Urinary Tract: Normal adrenal glands. Normal appearance of both kidneys without hydronephrosis. Difficult to exclude mild bladder wall thickening but minimally changed from the previous examination. Stomach/Bowel: Normal appearance of the stomach and duodenum. Right hemicolectomy. Mucosal enhancement and wall thickening involving the rectum and sigmoid colon. There is no significant small bowel wall thickening. Negative for a bowel obstruction. Lymphatic: No lymph node enlargement in the abdomen or pelvis. Reproductive: Stable appearance of the uterus and adnexal structures. Other: Stable stranding or edema in the pelvic region, including the presacral region. These findings are similar to the previous examination. Negative for free air. Musculoskeletal: Significant disc space narrowing and endplate disease at LI7-T2and L4-L5. Disc space narrowing at L1-L2. IMPRESSION: VASCULAR 1. Mild atherosclerotic disease the aorta and visceral arteries. No significant visceral artery stenosis. Negative for an aortic aneurysm. 2. Venous structures are unremarkable. NON-VASCULAR 1. Mucosal enhancement and wall thickening in the rectum and sigmoid colon. Findings are similar to the previous examination and probably  represent chronic inflammation related to cervical cancer treatment. Stable edema or stranding in the pelvis. 2. Right hemicolectomy. 3. Innumerable pulmonary nodules compatible with metastatic disease. 4. Stable biliary dilatation, status post cholecystectomy. Electronically Signed   By: AMarkus DaftM.D.   On: 12/16/2018 14:48    ASSESSMENT & PLAN:   Malignant neoplasm of overlapping sites of cervix ([facility] #Cervical adenocarcinoma with metastasis to the lung-currently on carbotaxol.  CT scan chest-February 2020-bilateral up to a centimeter sized lung nodules.  Stable.  #Currently status post carbotaxol cycle #7 2 weeks ago.  We will proceed with cycle #8 in 1 week.  #Hypomagnesemia-secondary platinum chemotherapy-remains in 1.4 proceed with 4 g magnesium today. STABLE>   #Chronic right lower quadrant abdominal pain/scar tissue versus others-clinically  stable continue oxycodone.  #Intermittent diarrhea-question withdrawal from narcotic.  Discussed with [employer].  Improved.  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  Followed by GI stable.  #History of DVT/PE-continue Xarelto.  STABLE>   # adrenal insuffiency- on maintenance steroids-STABLE.    # Educated the patient regarding novel coronavirus-modes of transmission/risks; and measures to avoid infection.   # DISPOSITION: # magnseium IV 4 gm/ 2 hours today.  # follow up in 1 week- MD/ carbo-taxol;Mag infusion;  D-2 udenyca # 2 weeks- mag 4 gm infusion. # follow up in 3 weeks/ carbo-Taxol cbc/cmp/mag- magnesium 4 gm. 2 hours-Dr.B   All questions were answered. The patient knows to call the clinic with any problems, questions or concerns.       Cammie Sickle, MD 01/15/2019 9:51 AM

## 2019-01-16 ENCOUNTER — Encounter: Payer: Self-pay | Admitting: Internal Medicine

## 2019-01-17 ENCOUNTER — Telehealth: Payer: Self-pay

## 2019-01-17 NOTE — Telephone Encounter (Signed)
Called patient to advise that in response to COVID-19 Pandemic and to ensure the continuous provision of safe, quality care for our patients, communities, staff, and providers, The [facility] Oncology Service line has advised [place] to temporarily alter scheduling and appointments of our patients. We discussed that patient is currently being seen for cervical cancer and based on this, we recommend she continue her appointments with medical oncology (Dr. Rogue Bussing) for ongoing treatment and reschedule her pelvic exam with Dr. Fransisca Connors in [place].  Appointment rescheduled from 01-22-19 to 04-05-19.  Encouraged to call with any needs in the interim.

## 2019-01-20 ENCOUNTER — Other Ambulatory Visit: Payer: Self-pay | Admitting: *Deleted

## 2019-01-20 ENCOUNTER — Telehealth: Payer: Self-pay | Admitting: *Deleted

## 2019-01-20 ENCOUNTER — Emergency Department
Admission: EM | Admit: 2019-01-20 | Discharge: 2019-01-20 | Disposition: A | Discharge status: Home / Self Care | Payer: Medicaid Other | Attending: Emergency Medicine | Admitting: Emergency Medicine

## 2019-01-20 ENCOUNTER — Inpatient Hospital Stay: Payer: Medicaid Other

## 2019-01-20 ENCOUNTER — Encounter: Payer: Self-pay | Admitting: Emergency Medicine

## 2019-01-20 ENCOUNTER — Other Ambulatory Visit: Payer: Self-pay

## 2019-01-20 ENCOUNTER — Inpatient Hospital Stay: Payer: Medicaid Other | Admitting: Nurse Practitioner

## 2019-01-20 DIAGNOSIS — Z8541 Personal history of malignant neoplasm of cervix uteri: Secondary | ICD-10-CM | POA: Insufficient documentation

## 2019-01-20 DIAGNOSIS — R531 Weakness: Secondary | ICD-10-CM | POA: Insufficient documentation

## 2019-01-20 DIAGNOSIS — Z85118 Personal history of other malignant neoplasm of bronchus and lung: Secondary | ICD-10-CM | POA: Diagnosis not present

## 2019-01-20 DIAGNOSIS — F1210 Cannabis abuse, uncomplicated: Secondary | ICD-10-CM | POA: Diagnosis not present

## 2019-01-20 DIAGNOSIS — Z96652 Presence of left artificial knee joint: Secondary | ICD-10-CM | POA: Insufficient documentation

## 2019-01-20 DIAGNOSIS — R197 Diarrhea, unspecified: Secondary | ICD-10-CM

## 2019-01-20 DIAGNOSIS — N3000 Acute cystitis without hematuria: Secondary | ICD-10-CM

## 2019-01-20 DIAGNOSIS — Z9221 Personal history of antineoplastic chemotherapy: Secondary | ICD-10-CM | POA: Insufficient documentation

## 2019-01-20 LAB — URINALYSIS, COMPLETE (UACMP) WITH MICROSCOPIC
Bilirubin Urine: NEGATIVE
Glucose, UA: NEGATIVE mg/dL
Ketones, ur: NEGATIVE mg/dL
Nitrite: NEGATIVE
Protein, ur: NEGATIVE mg/dL
Specific Gravity, Urine: 1.005 (ref 1.005–1.030)
WBC, UA: >50 WBC/hpf — ABNORMAL HIGH (ref 0–5)
pH: 7 (ref 5.0–8.0)

## 2019-01-20 LAB — COMPREHENSIVE METABOLIC PANEL
ALT: 86 U/L — ABNORMAL HIGH (ref 0–44)
AST: 186 U/L — ABNORMAL HIGH (ref 15–41)
Albumin: 3.5 g/dL (ref 3.5–5.0)
Alkaline Phosphatase: 243 U/L — ABNORMAL HIGH (ref 38–126)
Anion gap: 6 (ref 5–15)
BUN: 7 mg/dL (ref 6–20)
CO2: 25 mmol/L (ref 22–32)
Calcium: 9.1 mg/dL (ref 8.9–10.3)
Chloride: 105 mmol/L (ref 98–111)
Creatinine, Ser: 0.58 mg/dL (ref 0.44–1.00)
GFR calc Af Amer: >60 mL/min (ref >60)
GFR calc non Af Amer: >60 mL/min (ref >60)
Glucose, Bld: 98 mg/dL (ref 70–99)
Potassium: 3.9 mmol/L (ref 3.5–5.1)
Sodium: 136 mmol/L (ref 135–145)
Total Bilirubin: 0.5 mg/dL (ref 0.3–1.2)
Total Protein: 7.3 g/dL (ref 6.5–8.1)

## 2019-01-20 LAB — CBC WITH DIFFERENTIAL/PLATELET
Abs Immature Granulocytes: 0.02 10*3/uL (ref 0.00–0.07)
Basophils Absolute: 0 10*3/uL (ref 0.0–0.1)
Basophils Relative: 0 %
Eosinophils Absolute: 0 10*3/uL (ref 0.0–0.5)
Eosinophils Relative: 0 %
HCT: 28 % — ABNORMAL LOW (ref 36.0–46.0)
Hemoglobin: 9.4 g/dL — ABNORMAL LOW (ref 12.0–15.0)
Immature Granulocytes: 1 %
Lymphocytes Relative: 14 %
Lymphs Abs: 0.6 10*3/uL — ABNORMAL LOW (ref 0.7–4.0)
MCH: 35.6 pg — ABNORMAL HIGH (ref 26.0–34.0)
MCHC: 33.6 g/dL (ref 30.0–36.0)
MCV: 106.1 fL — ABNORMAL HIGH (ref 80.0–100.0)
Monocytes Absolute: 0.2 10*3/uL (ref 0.1–1.0)
Monocytes Relative: 5 %
Neutro Abs: 3.4 10*3/uL (ref 1.7–7.7)
Neutrophils Relative %: 80 %
Platelets: 76 10*3/uL — ABNORMAL LOW (ref 150–400)
RBC: 2.64 MIL/uL — ABNORMAL LOW (ref 3.87–5.11)
RDW: 14.5 % (ref 11.5–15.5)
WBC: 4.2 10*3/uL (ref 4.0–10.5)
nRBC: 0 % (ref 0.0–0.2)

## 2019-01-20 LAB — TROPONIN I: Troponin I: <0.03 ng/mL (ref <0.03)

## 2019-01-20 LAB — MAGNESIUM: Magnesium: 1.4 mg/dL — ABNORMAL LOW (ref 1.7–2.4)

## 2019-01-20 LAB — GLUCOSE, CAPILLARY: Glucose-Capillary: 97 mg/dL (ref 70–99)

## 2019-01-20 MED ORDER — OXYCODONE HCL 10 MG PO TABS: 10.0000 mg | ORAL_TABLET | Freq: Four times a day (QID) | ORAL | 0 refills | Status: DC | PRN

## 2019-01-20 MED ORDER — SODIUM CHLORIDE 0.9 % IV SOLN
Freq: Once | INTRAVENOUS | Status: AC
  Administered 2019-01-20: 14:00:00 via INTRAVENOUS

## 2019-01-20 MED ORDER — OXYCODONE HCL ER 15 MG PO T12A: 15.0000 mg | EXTENDED_RELEASE_TABLET | Freq: Two times a day (BID) | ORAL | 0 refills | Status: DC

## 2019-01-20 MED ORDER — MAGNESIUM SULFATE 2 GM/50ML IV SOLN
2.0000 g | Freq: Once | INTRAVENOUS | Status: AC
  Administered 2019-01-20: 2 g via INTRAVENOUS
  Filled 2019-01-20: qty 50

## 2019-01-20 MED ORDER — CEPHALEXIN 500 MG PO CAPS: 500.0000 mg | ORAL_CAPSULE | Freq: Four times a day (QID) | ORAL | 0 refills | Status: DC

## 2019-01-20 MED ORDER — SODIUM CHLORIDE 0.9 % IV SOLN
1.0000 g | Freq: Once | INTRAVENOUS | Status: AC
  Administered 2019-01-20: 1 g via INTRAVENOUS
  Filled 2019-01-20: qty 10

## 2019-01-20 NOTE — ED Provider Notes (Addendum)
[facility] Emergency Department Provider Note       Time seen: ----------------------------------------- 1:11 PM on 01/20/2019 -----------------------------------------   I have reviewed the triage vital signs and the nursing notes.  HISTORY   Chief Complaint Weakness    HPI Joanna Hall is a 53 y.o. female with a history of abdominal pain, PE, cervical cancer, GERD, chronic hepatitis, pancolitis, small bowel obstruction who presents to the ED for weakness.  Patient states she was seen at the cancer center today for possible IV fluid infusion and repeat blood work.  Patient states last blood work showed she had a hemoglobin of 9.  Mother is a coronavirus rule out patient and is currently at home and self quarantine.  Patient describes chronic shortness of breath and chronic diarrhea.  Past Medical History:  Diagnosis Date  . Abdominal pain 06/10/2018  . Abnormal cervical Papanicolaou smear 09/18/2017  . Anxiety   . Aortic atherosclerosis ([place])   . Arthritis    neck and knees  . 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 Blood transfusion without reported diagnosis   . Cervical cancer ([place]) 09/2016   mets lung  . Chronic anal fissure   . Chronic diarrhea   . Dyspnea   . Erosive gastropathy 09/18/2017  . Factor V Leiden mutation ([place])   . Fecal incontinence   . Genital warts   . GERD (gastroesophageal reflux disease)   . Heart murmur   . Hematochezia   . Hemorrhoids   . 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 Hepatitis, chronic ([place]) 05/05/2017  . History of cancer chemotherapy    completed 06/2017  . History of Clostridium difficile infection    while undergoing chemo.  Negative test 10/2017  . Ileocolic anastomotic leak   . 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 Intestinal infection due to Clostridium difficile 09/18/2017  . Macrocytic anemia with vitamin B12 deficiency   . Multiple  gastric ulcers   . Nausea vomiting and diarrhea   . Pancolitis ([place]) 07/27/2018  . Perianal condylomata   . Pneumonia    History of  . Pulmonary nodules   . Rectal bleeding   . Small bowel obstruction ([place]) 08/2017  . Stiff neck    limited right turn  . Vitamin D deficiency     Patient Active Problem List   Diagnosis Date Noted  . Cancer associated pain   . Proctitis, radiation   . Palliative care encounter   . Encounter for monitoring opioid maintenance therapy 11/19/2018  . GI bleed 10/08/2018  . Adrenal insufficiency ([place]) 09/03/2018  . Chronic diarrhea 06/25/2018  . Chronic anticoagulation 06/25/2018  . Vomiting   . Encounter for antineoplastic chemotherapy 06/04/2018  . 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 Lung metastasis ([place]) 05/15/2018  . Closed fracture of distal end of radius 05/04/2018  . History of total knee arthroplasty 04/24/2018  . Osteoarthritis of left knee 02/28/2018  . Condyloma acuminatum of anus s/p ablation 02/22/2018 02/22/2018  . Condyloma acuminatum of vagina s/p ablation 02/22/2018 02/22/2018  . Positive ANA (antinuclear antibody) 02/04/2018  . Aortic atherosclerosis ([place]) 01/15/2018  . Elevated MCV 01/15/2018  . Anemia 01/15/2018  . Genital warts 01/15/2018  . Vitamin D deficiency 01/15/2018  . Pernicious anemia   . Impingement syndrome of shoulder region 12/19/2017  . Multiple joint pain 12/19/2017  . Osteoarthritis of right knee  12/19/2017  . B12 deficiency 12/11/2017  . Lung nodules 12/11/2017  . History of Clostridium difficile infection 10/23/2017  . Factor V Leiden ([place]) 10/19/2017  . Goals of care, counseling/discussion 10/19/2017  . Cervical cancer, FIGO stage IB1 ([place]) 09/18/2017  . Chronic hepatitis C without hepatic coma ([place]) 09/18/2017  . Cytopenia 09/18/2017  . Diarrhea 09/18/2017  . Lower abdominal pain 09/18/2017  . 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 Malignant neoplasm of overlapping sites of cervix ([place]) 09/18/2017   . Renal insufficiency 09/18/2017  . Wound infection after surgery 09/12/2017  . Hypokalemia   . Hypomagnesemia   . Cervical arthritis 07/18/2017  . Dysuria 06/20/2017  . Metastatic cancer ([place]) 05/12/2017  . Essential hypertension 03/15/2017  . Anemia in other chronic diseases classified elsewhere 03/01/2017  . Chemotherapy-induced neutropenia ([place]) 01/29/2017  . 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[place]) 09/25/2016    Past Surgical History:  Procedure Laterality Date  . CHOLECYSTECTOMY    . COLON SURGERY  08/2017   resection  . COLONOSCOPY WITH PROPOFOL N/A 12/20/2017   Procedure: COLONOSCOPY WITH PROPOFOL;  Surgeon: [name], MD;  Location: [facility] ENDOSCOPY;  Service: Gastroenterology;  Laterality: N/A;  . COLONOSCOPY WITH PROPOFOL N/A 07/30/2018   Procedure: COLONOSCOPY WITH PROPOFOL;  Surgeon: [name], MD;  Location: [facility] ENDOSCOPY;  Service: Gastroenterology;  Laterality: N/A;  . COLONOSCOPY WITH PROPOFOL N/A 10/10/2018   Procedure: COLONOSCOPY WITH PROPOFOL;  Surgeon: Lucilla Lame, MD;  Location: [facility] ENDOSCOPY;  Service: Endoscopy;  Laterality: N/A;  . DIAGNOSTIC LAPAROSCOPY    . ESOPHAGOGASTRODUODENOSCOPY (EGD) WITH PROPOFOL N/A 12/20/2017   Procedure: ESOPHAGOGASTRODUODENOSCOPY (EGD) WITH PROPOFOL;  Surgeon: [name], MD;  Location: [place];  Service: Gastroenterology;  Laterality: N/A;  . ESOPHAGOGASTRODUODENOSCOPY (EGD) WITH PROPOFOL  07/30/2018   Procedure: ESOPHAGOGASTRODUODENOSCOPY (EGD) WITH PROPOFOL;  Surgeon: [name], MD;  Location: [facility] ENDOSCOPY;  Service: Gastroenterology;;  . Otho Darner SIGMOIDOSCOPY N/A 11/21/2018   Procedure: FLEXIBLE SIGMOIDOSCOPY;  Surgeon: [name], MD;  Location: [facility] ENDOSCOPY;  Service: Gastroenterology;  Laterality: N/A;  . LAPAROTOMY N/A 08/31/2017   Procedure: EXPLORATORY LAPAROTOMY for SBO, ileocolectomy, removal of piece of uterine wall;  Surgeon: [name], MD;  Location: [facility]  ORS;  Service: General;  Laterality: N/A;  . LASER ABLATION CONDOLAMATA N/A 02/22/2018   Procedure: LASER ABLATION/REMOVAL OF [id number] AROUND ANUS AND VAGINA;  Surgeon: Michael Boston, MD;  Location: [place];  Service: General;  Laterality: N/A;  . OOPHORECTOMY    . PORTA CATH INSERTION N/A 05/13/2018   Procedure: PORTA CATH INSERTION;  Surgeon: Katha Cabal, MD;  Location: [place] CV LAB;  Service: Cardiovascular;  Laterality: N/A;  . SMALL INTESTINE SURGERY    . TANDEM RING INSERTION     x3  . THORACOTOMY    . TOTAL KNEE ARTHROPLASTY Left 04/24/2018   Procedure: TOTAL KNEE ARTHROPLASTY;  Surgeon: Lovell Sheehan, MD;  Location: [facility] ORS;  Service: Orthopedics;  Laterality: Left;    Allergies Ketamine  Social History Social History   Tobacco Use  . Smoking status: Former Smoker    Last attempt to quit: 10/16/2006    Years since quitting: 12.2  . Smokeless tobacco: Never Used  Substance Use Topics  . Alcohol use: Not Currently    Frequency: Never    Comment: seldom  . Drug use: Yes    Types: Marijuana   Review of Systems Constitutional: Negative for fever. Cardiovascular: Negative for chest pain. Respiratory: Positive for shortness of breath that is chronic  Gastrointestinal: Negative for abdominal pain, positive for chronic diarrhea Musculoskeletal: Negative for back pain. Skin: Negative for rash. Neurological: Negative for headaches, positive for weakness  All systems negative/normal/unremarkable except as stated in the HPI  ____________________________________________   PHYSICAL EXAM:  VITAL SIGNS: ED Triage Vitals  Enc Vitals Group     BP 01/20/19 1258 107/66     Pulse Rate 01/20/19 1258 (!) 104     Resp 01/20/19 1258 (!) 22     Temp 01/20/19 1258 98 F (36.7 C)     Temp Source 01/20/19 1258 Oral     SpO2 01/20/19 1258 97 %     Weight 01/20/19 1256 174 lb (78.9 kg)     Height 01/20/19 1256 5' 9"  (4.034 m)     Head  Circumference --      Peak Flow --      Pain Score 01/20/19 1256 7     Pain Loc --      Pain Edu? --      Excl. in [place]? --    Constitutional: Alert and oriented. Well appearing and in no distress. Eyes: Conjunctivae are normal. Normal extraocular movements. ENT      Head: Normocephalic and atraumatic.      Nose: No congestion/rhinnorhea.      Mouth/Throat: Mucous membranes are moist.      Neck: No stridor. Cardiovascular: Normal rate, regular rhythm. No murmurs, rubs, or gallops. Respiratory: Normal respiratory effort without tachypnea nor retractions. Breath sounds are clear and equal bilaterally. No wheezes/rales/rhonchi. Gastrointestinal: Soft and nontender. Normal bowel sounds Musculoskeletal: Nontender with normal range of motion in extremities. No lower extremity tenderness nor edema. Neurologic:  Normal speech and language. No gross focal neurologic deficits are appreciated.  Skin:  Skin is warm, dry and intact. No rash noted. Psychiatric: Mood and affect are normal. Speech and behavior are normal.  ____________________________________________  EKG: Interpreted by me.  Sinus tachycardia with a rate of 107 bpm, normal PR interval, normal QRS, normal QT  ____________________________________________  ED COURSE:  As part of my medical decision making, I reviewed the following data within the [place] History obtained from family if available, nursing notes, old chart and ekg, as well as notes from prior ED visits. Patient presented for weakness, we will assess with labs and imaging as indicated at this time.   Procedures Joanna Hall was evaluated in Emergency Department on 01/20/2019 for the symptoms described in the history of present illness. She was evaluated in the context of the global COVID-19 pandemic, which necessitated consideration that the patient might be at risk for infection with the SARS-CoV-2 virus that causes COVID-19. Institutional  protocols and algorithms that pertain to the evaluation of patients at risk for COVID-19 are in a state of rapid change based on information released by regulatory bodies including the CDC and federal and state organizations. These policies and algorithms were followed during the patient's care in the ED.  ____________________________________________   LABS (pertinent positives/negatives)  Labs Reviewed  CBC WITH DIFFERENTIAL/PLATELET - Abnormal; Notable for the following components:      Result Value   RBC 2.64 (*)    Hemoglobin 9.4 (*)    HCT 28.0 (*)    MCV 106.1 (*)    MCH 35.6 (*)    Platelets 76 (*)    Lymphs Abs 0.6 (*)    All other components within normal limits  COMPREHENSIVE METABOLIC PANEL - Abnormal; Notable for the following components:   AST  186 (*)    ALT 86 (*)    Alkaline Phosphatase 243 (*)    All other components within normal limits  URINALYSIS, COMPLETE (UACMP) WITH MICROSCOPIC - Abnormal; Notable for the following components:   Color, Urine YELLOW (*)    APPearance CLOUDY (*)    Hgb urine dipstick SMALL (*)    Leukocytes,Ua MODERATE (*)    WBC, UA >50 (*)    Bacteria, UA MANY (*)    All other components within normal limits  MAGNESIUM - Abnormal; Notable for the following components:   Magnesium 1.4 (*)    All other components within normal limits  TROPONIN I  GLUCOSE, CAPILLARY  CBG MONITORING, ED  ____________________________________________   DIFFERENTIAL DIAGNOSIS   Dehydration, electrolyte abnormality, anemia, occult infection  FINAL ASSESSMENT AND PLAN  Weakness, hypomagnesemia, UTI   Plan: The patient had presented for generalized weakness. Patient's labs did reveal mild elevations in her liver function test and a mildly depressed magnesium level.  She has a chronic anemia as well as a urinary tract infection.  She is in no acute distress, received IV fluids and IV magnesium and IV Rocephin.  She otherwise appears cleared for outpatient  follow-up.   Laurence Aly, MD    Note: This note was generated in part or whole with voice recognition software. Voice recognition is usually quite accurate but there are transcription errors that can and very often do occur. I apologize for any typographical errors that were not detected and corrected.     Earleen Newport, MD 01/20/19 1415    Earleen Newport, MD 01/20/19 [phone]

## 2019-01-20 NOTE — ED Triage Notes (Signed)
Pt presents to ED via POV with c/o weakness. Pt states was to be seen at [facility] center today for fluids and repeat blood work. Pt states last set blood work showed Hgb "9 something". Pt reports her mother was recently admitted to hospital and is a Covid rule out and is currently back at home but has not received test results. Pt reports SOB at baseline, no new SOB. Pt reports generalized weakness at this time as main complaint.

## 2019-01-20 NOTE — Telephone Encounter (Signed)
Patient called reporting that she has had diarrhea all weekend and needs IV fluids. Discussed with Lorretta Harp, NP and will have her come in for labs, [facility] and IV fluids.  Patient accepts ppt for 1245 today

## 2019-01-20 NOTE — ED Notes (Signed)
Pt verbalized understanding of d/c instructions, RX, and f/u care. No further questions at this time. Pt ambulatory to the exit with steady gait

## 2019-01-21 ENCOUNTER — Telehealth: Payer: Self-pay | Admitting: *Deleted

## 2019-01-21 ENCOUNTER — Ambulatory Visit: Payer: Medicaid Other | Admitting: Oncology

## 2019-01-21 ENCOUNTER — Other Ambulatory Visit: Payer: Self-pay

## 2019-01-21 NOTE — Telephone Encounter (Signed)
Patient called reporting that her mothers Covid results have come back NEGATIVE. She states she will be here tomorrow as planned

## 2019-01-22 ENCOUNTER — Ambulatory Visit
Admission: RE | Admit: 2019-01-22 | Discharge: 2019-01-22 | Disposition: A | Discharge status: Home / Self Care | Payer: Medicaid Other | Source: Ambulatory Visit | Attending: Internal Medicine | Admitting: Internal Medicine

## 2019-01-22 ENCOUNTER — Inpatient Hospital Stay: Payer: Medicaid Other

## 2019-01-22 ENCOUNTER — Ambulatory Visit: Payer: Medicaid Other

## 2019-01-22 ENCOUNTER — Inpatient Hospital Stay (HOSPITAL_BASED_OUTPATIENT_CLINIC_OR_DEPARTMENT_OTHER): Payer: Medicaid Other | Admitting: Internal Medicine

## 2019-01-22 ENCOUNTER — Inpatient Hospital Stay: Payer: Medicaid Other | Admitting: Internal Medicine

## 2019-01-22 ENCOUNTER — Encounter: Payer: Medicaid Other | Admitting: Hospice and Palliative Medicine

## 2019-01-22 ENCOUNTER — Other Ambulatory Visit: Payer: Self-pay

## 2019-01-22 ENCOUNTER — Encounter: Payer: Self-pay | Admitting: Internal Medicine

## 2019-01-22 VITALS — BP 120/84 | HR 85 | Temp 99.1°F | Resp 22 | Ht 69.0 in | Wt 178.0 lb

## 2019-01-22 DIAGNOSIS — M79662 Pain in left lower leg: Secondary | ICD-10-CM | POA: Diagnosis not present

## 2019-01-22 DIAGNOSIS — C538 Malignant neoplasm of overlapping sites of cervix uteri: Secondary | ICD-10-CM | POA: Diagnosis not present

## 2019-01-22 DIAGNOSIS — R197 Diarrhea, unspecified: Secondary | ICD-10-CM

## 2019-01-22 DIAGNOSIS — Z5111 Encounter for antineoplastic chemotherapy: Secondary | ICD-10-CM | POA: Diagnosis not present

## 2019-01-22 DIAGNOSIS — M7989 Other specified soft tissue disorders: Secondary | ICD-10-CM

## 2019-01-22 DIAGNOSIS — Z95828 Presence of other vascular implants and grafts: Secondary | ICD-10-CM

## 2019-01-22 LAB — COMPREHENSIVE METABOLIC PANEL
ALT: 40 U/L (ref 0–44)
AST: 33 U/L (ref 15–41)
Albumin: 3.4 g/dL — ABNORMAL LOW (ref 3.5–5.0)
Alkaline Phosphatase: 183 U/L — ABNORMAL HIGH (ref 38–126)
Anion gap: 5 (ref 5–15)
BUN: 13 mg/dL (ref 6–20)
CO2: 27 mmol/L (ref 22–32)
Calcium: 8.7 mg/dL — ABNORMAL LOW (ref 8.9–10.3)
Chloride: 105 mmol/L (ref 98–111)
Creatinine, Ser: 0.67 mg/dL (ref 0.44–1.00)
GFR calc Af Amer: >60 mL/min (ref >60)
GFR calc non Af Amer: >60 mL/min (ref >60)
Glucose, Bld: 89 mg/dL (ref 70–99)
Potassium: 3.9 mmol/L (ref 3.5–5.1)
Sodium: 137 mmol/L (ref 135–145)
Total Bilirubin: 0.4 mg/dL (ref 0.3–1.2)
Total Protein: 6.7 g/dL (ref 6.5–8.1)

## 2019-01-22 LAB — MAGNESIUM: Magnesium: 1.5 mg/dL — ABNORMAL LOW (ref 1.7–2.4)

## 2019-01-22 LAB — CBC WITH DIFFERENTIAL/PLATELET
Abs Immature Granulocytes: 0.02 10*3/uL (ref 0.00–0.07)
Basophils Absolute: 0 10*3/uL (ref 0.0–0.1)
Basophils Relative: 0 %
Eosinophils Absolute: 0 10*3/uL (ref 0.0–0.5)
Eosinophils Relative: 0 %
HCT: 26 % — ABNORMAL LOW (ref 36.0–46.0)
Hemoglobin: 8.7 g/dL — ABNORMAL LOW (ref 12.0–15.0)
Immature Granulocytes: 1 %
Lymphocytes Relative: 17 %
Lymphs Abs: 0.7 10*3/uL (ref 0.7–4.0)
MCH: 36 pg — ABNORMAL HIGH (ref 26.0–34.0)
MCHC: 33.5 g/dL (ref 30.0–36.0)
MCV: 107.4 fL — ABNORMAL HIGH (ref 80.0–100.0)
Monocytes Absolute: 0.3 10*3/uL (ref 0.1–1.0)
Monocytes Relative: 7 %
Neutro Abs: 3.1 10*3/uL (ref 1.7–7.7)
Neutrophils Relative %: 75 %
Platelets: 80 10*3/uL — ABNORMAL LOW (ref 150–400)
RBC: 2.42 MIL/uL — ABNORMAL LOW (ref 3.87–5.11)
RDW: 15.3 % (ref 11.5–15.5)
WBC: 4.2 10*3/uL (ref 4.0–10.5)
nRBC: 0 % (ref 0.0–0.2)

## 2019-01-22 LAB — URINE CULTURE
Culture: >=100000 — AB
Special Requests: NORMAL

## 2019-01-22 MED ORDER — HEPARIN SOD (PORK) LOCK FLUSH 100 UNIT/ML IV SOLN: 500.0000 [IU] | Freq: Once | INTRAVENOUS | Status: DC

## 2019-01-22 MED ORDER — HEPARIN SOD (PORK) LOCK FLUSH 100 UNIT/ML IV SOLN
500.0000 [IU] | Freq: Once | INTRAVENOUS | Status: AC
  Administered 2019-01-22: 500 [IU]

## 2019-01-22 MED ORDER — MAGNESIUM SULFATE 4 GM/100ML IV SOLN
4.0000 g | Freq: Once | INTRAVENOUS | Status: AC
  Administered 2019-01-22: 12:00:00 4 g via INTRAVENOUS
  Filled 2019-01-22: qty 100

## 2019-01-22 MED ORDER — SODIUM CHLORIDE 0.9 % IV SOLN
INTRAVENOUS | Status: DC
  Administered 2019-01-22: 11:00:00 via INTRAVENOUS
  Filled 2019-01-22 (×2): qty 250

## 2019-01-22 MED ORDER — SODIUM CHLORIDE 0.9% FLUSH
10.0000 mL | Freq: Once | INTRAVENOUS | Status: AC
  Administered 2019-01-22: 10 mL via INTRAVENOUS
  Filled 2019-01-22: qty 10

## 2019-01-22 NOTE — Progress Notes (Signed)
OFF PATHWAY REGIMEN - [Other Dx]  No Change  Continue With Treatment as Ordered.   [id number]:Carboplatin AUC=6 + Paclitaxel 175 mg/m2 q21 Days:   A cycle is every 21 days:     Paclitaxel      Carboplatin   **Always confirm dose/schedule in your pharmacy ordering system**  Patient Characteristics: Intent of Therapy: Non-Curative / Palliative Intent, Discussed with Patient

## 2019-01-22 NOTE — Assessment & Plan Note (Addendum)
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  CT scan chest-February 2020-bilateral up to a centimeter sized lung nodules.  STABLE.Currently status post carbotaxol cycle #7appx. 3 weeks ago.  # Today labs hemoglobin-8.7; platelets 80.  Secondary to carboplatin bone marrow suppression.  Recommend discontinuation of carbo but moving forward.  Recommend Taxol weekly times three 1 week off.  # Left LE pain/thigh- ? DVT [currently on xarelto]  # UTI- Klebsiella- On keflex [days ago].Marland Kitchen  #Hypomagnesemia-secondary platinum chemotherapy-mag 1.5-  proceed with 4 g magnesium today.stable.   #Chronic right lower quadrant abdominal pain/scar tissue versus others-clinically stable continue oxycodone.see above.   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   #History of DVT/PE-continue Xarelto. See above.  # adrenal insuffiency- on maintenance steroids-stable.   # DISPOSITION: # magnseium IV 4 gm/ 2 hours today/IVFs.  # cancel chemo tomorrow.  # LE venous dopplers- STAT  # follow up in 1 week-MDcbc/bmp/mag/ONLY taxol;Mag infusion 2 hours-Dr.B

## 2019-01-22 NOTE — Progress Notes (Signed)
[place] NOTE  Patient Care Team: Lada, Satira Anis, MD as PCP - General (Family Medicine) Mellody Drown, MD as Consulting Physician (Obstetrics and Gynecology) Lenor Coffin, MD as Attending Physician (Gynecology) Lequita Asal, MD as Medical Oncologist (Hematology and Oncology) [name], MD as Consulting Physician (Gastroenterology) Michael Boston, MD as Consulting Physician (General Surgery) Lovell Sheehan, MD as Consulting Physician (Orthopedic Surgery) Clent Jacks, RN as Registered Nurse  CHIEF COMPLAINTS/PURPOSE OF CONSULTATION: Cervical cancer  #  Oncology History   # dec 2017- CERVICAL AD[place] CA [[place]]; Stage IB [Dr. Christene Slates at [place] [facility] in [place], [place];No surgery- Chemo-RT;  # 2018-sep - lung nodules [in [place]]  # AUG 20th, 2019-#1 Carbo-Taxol s/p 6 cycles- [No avastin sec to blood clots]: Feb 2020-bilateral lung nodules with 1 cm in size.  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  She received treatment from 11/2016-05/2017.  01/2017 cisplatin x 2 and carboplatin x 1 (01/29/2017) due to ARF and XRT.  XRT was followed by T & O on 02/01/2017 and T & N 02/10/2017 and 02/20/2017.  Course was complicated by 80 pound weight loss, nausea, vomiting, electrolyte wasting (potassium and magnesium).  She describes that.  Is been sick constantly requiring at least 20 hospitalizations.  Follow-up CT chest and PET on 06/2017. Per patient, 'radiation worked' and no disease in the abdomen.  At that time she was noted to have lung nodules that were growing and follow-up imaging was scheduled for  10/2017.  She was admitted to hospital in [place] for small bowel obstruction which was managed conservatively and she was home for a week prior to traveling to [place] for Thanksgiving holiday where she has family.  She presented to ER in [place] on 08/2017 with nausea, vomiting, and lower abdominal pain.  Symptoms did not respond to conservative treatment.  CT on 08/26/2017 revealed small bowel obstruction with transition in the pelvis just superior to the uterus rather was a long segment of distal ileum with fatty wall thickening compatible with chronic inflammation and/or radiation enteritis. Imaging showed numerous pulmonary nodules consistent with metastatic disease. She underwent laparotomy and right ileocolectomy on 08/31/2017 at [facility].  Surgical findings revealed a thickened, matted, and scarred piece of distal small bowel close to the ileocecal valve.  She was discharged on 09/05/2017.  Pain markedly increased in intensity and imaging was performed on 09/11/2017 which revealed: Debris within the anterior abdominal wall incision concerning for infection versus packing material, s/p post ileo-colectomy with expected postoperative changes, mild colonic ileus, numerous pulmonary nodules highly concerning for metastatic disease, punctate nonobstructing nephrolithiasis.  Staples were removed and one was packed.  She was started on doxycycline.  Abdominal and pelvic CT without contrast on 09/11/2017 revealed debris within anterior abdominal wall incision concerning for infection, versus packing material.She is s/pileocolectomy with expected postoperative changes and mild colonic ileus. There were numerous pulmonary nodules highly concerning for metastatic diseaseand punctate nonobstructing nephrolithiasis. She was readmitted on 09/12/2017. She describes the onset of lower abdominal pain on 09/09/2017. Pain markedly increased in intensity on 09/11/2017.  Staples were removed  and the wound packed. She was started on doxycycline.  CT on 09/13/2017 showed postsurgical changes from ileocecectomy with primary  ileocolic anastomosis without evidence of abscess or leak, edema small bowel loops of distal ileum, gas within ventral midline surgical wound corresponding to wound infection versus packing material, small infarct at the inferior pole of left kidney, right uterine infarct.  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  PET scan was ordered to evaluate enlarging lung nodules with concern for recurrent cervical cancer but scan was delayed due to insurance and need to be performed in [place].  Presented to ER on 12/03/2017 for abdominal pain and emesis.  Imaging concerning for worsening possible uterine infarct and she was admitted to hospital.  Pelvic MRI was unremarkable.  Remote scarring type changes of uterus thought to be possibly related to radiation.  She was discharged on 12/08/2017.  Underwent endoscopy and colonoscopy on 12/20/2017.    On 02/22/2018 she underwent laser ablation of condylomata around the anus and vagina under anesthesia with Dr. Johney Maine.   04/15/2018: Chest, abdomen, and pelvis CTrevealed innumerable (>100) cavitary nodules scattered in the lungs, moderately enlarging compared to the 11/08/2017 PET-CT, suspicious for metastatic disease. One index node in the RLL measures 1.0 x 1.1 cm (previously 0.6 x 0.6 cm). There were no new nodules. There was an ill-defined wall thickening in the rectosigmoid with surrounding stranding along fascia planes, probably sequela from prior radiation therapy. There was multilevel lumbar impingement due to spondylosis and degenerative disc disease. There was heterogeneous enhancement in the uterus (some possibly from prior radiation therapy).  04/23/2018:PET scan revealednumerous scattered solid and cavitary nodules in the lungs stable increased in size compared to the prior  PET-CT from 11/08/2017.Largest nodule was 1.1 cm in the LUL (SUV 1.9).These demonstratedlow-grade metabolic activity up to a maximum SUV of about 2.3, increased from 11/08/2017.   Case was discussed at tumor board on 04/25/2018. Consensus to pursue CT-guided biopsy (05/06/18) which revealed: Metastatic adenocarcinoma, morphologically consistent with cervical adenocarcinoma.  She has history of chronic hepatitis C which is managed by GI.  Hepatitis C genotype is 2a/2c.  She receives B12 injections for history of B12 deficiency.  On 04/24/2018 she underwent left total knee replacement with Dr. [name]. --------------------------------------------------------   DIAGNOSIS: Cervical adenocarcinoma  STAGE: IV        ;GOALS: Palliative  CURRENT/MOST RECENT THERAPY : Carbotaxol      Malignant neoplasm of overlapping sites of cervix ([place])   01/29/2019 -  Chemotherapy    The patient had PACLitaxel (TAXOL) 156 mg in sodium chloride 0.9 % 250 mL chemo infusion (</= 33m/m2), 80 mg/m2, Intravenous,  Once, 0 of 4 cycles  for chemotherapy treatment.       HISTORY OF PRESENTING ILLNESS:  Joanna [id number].[id number].  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   Patient complains of worsening lower pelvic pain anteriorly.  Patient was recently diagnosed with UTI currently started on Keflex.  Denies any fevers or rigors.   Patient complains of worsening left lower extremity swelling/pain.  Worse with movement.  She continues been Xarelto.  Overall feels poorly.  Chronic nausea.  Denies any new onset of shortness of breath or cough.  Review of Systems  Constitutional: Positive for malaise/fatigue. Negative for chills, diaphoresis, fever and weight loss.  HENT: Negative for nosebleeds and sore throat.   Eyes: Negative for double vision.  Respiratory: Negative for cough, hemoptysis, sputum production, shortness of breath  and wheezing.   Cardiovascular: Negative  for chest pain, palpitations, orthopnea and leg swelling.  Gastrointestinal: Positive for abdominal pain. Negative for blood in stool, constipation, diarrhea, heartburn, melena, nausea and vomiting.  Genitourinary: Negative for dysuria, frequency and urgency.  Musculoskeletal: Negative for back pain and joint pain.  Skin: Negative.  Negative for itching and rash.  Neurological: Negative for dizziness, tingling, focal weakness, weakness and headaches.  Endo/Heme/Allergies: Does not bruise/bleed easily.  Psychiatric/Behavioral: Negative for depression. The patient is not nervous/anxious and does not have insomnia.      MEDICAL HISTORY:  Past Medical History:  Diagnosis Date  . Abdominal pain 06/10/2018  . Abnormal cervical Papanicolaou smear 09/18/2017  . Anxiety   . Aortic atherosclerosis ([place])   . Arthritis    neck and knees  . 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 Blood transfusion without reported diagnosis   . Cervical cancer ([place]) 09/2016   mets lung  . Chronic anal fissure   . Chronic diarrhea   . Dyspnea   . Erosive gastropathy 09/18/2017  . Factor V Leiden mutation ([place])   . Fecal incontinence   . Genital warts   . GERD (gastroesophageal reflux disease)   . Heart murmur   . Hematochezia   . Hemorrhoids   . 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 Hepatitis, chronic ([place]) 05/05/2017  . History of cancer chemotherapy    completed 06/2017  . History of Clostridium difficile infection    while undergoing chemo.  Negative test 10/2017  . Ileocolic anastomotic leak   . 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 Intestinal infection due to Clostridium difficile 09/18/2017  . Macrocytic anemia with vitamin B12 deficiency   . Multiple gastric ulcers   . Nausea vomiting and diarrhea   . Pancolitis ([place]) 07/27/2018  . Perianal condylomata   . Pneumonia    History of  . Pulmonary  nodules   . Rectal bleeding   . Small bowel obstruction ([place]) 08/2017  . Stiff neck    limited right turn  . Vitamin D deficiency     SURGICAL HISTORY: Past Surgical History:  Procedure Laterality Date  . CHOLECYSTECTOMY    . COLON SURGERY  08/2017   resection  . COLONOSCOPY WITH PROPOFOL N/A 12/20/2017   Procedure: COLONOSCOPY WITH PROPOFOL;  Surgeon: [name], MD;  Location[facility] ENDOSCOPY;  Service: Gastroenterology;  Laterality: N/A;  . COLONOSCOPY WITH PROPOFOL N/A 07/30/2018   Procedure: COLONOSCOPY WITH PROPOFOL;  Surgeon: [name], MD;  Location[facility] ENDOSCOPY;  Service: Gastroenterology;  Laterality: N/A;  . COLONOSCOPY WITH PROPOFOL N/A 10/10/2018   Procedure: COLONOSCOPY WITH PROPOFOL;  Surgeon: Lucilla Lame, MD;  Location[facility] ENDOSCOPY;  Service: Endoscopy;  Laterality: N/A;  . DIAGNOSTIC LAPAROSCOPY    . ESOPHAGOGASTRODUODENOSCOPY (EGD) WITH PROPOFOL N/A 12/20/2017   Procedure: ESOPHAGOGASTRODUODENOSCOPY (EGD) WITH PROPOFOL;  Surgeon: [name], MD;  Location: [place];  Service: Gastroenterology;  Laterality: N/A;  . ESOPHAGOGASTRODUODENOSCOPY (EGD) WITH PROPOFOL  07/30/2018   Procedure: ESOPHAGOGASTRODUODENOSCOPY (EGD) WITH PROPOFOL;  Surgeon: [name], MD;  Location: [facility] ENDOSCOPY;  Service: Gastroenterology;;  . Otho Darner SIGMOIDOSCOPY N/A 11/21/2018   Procedure: FLEXIBLE SIGMOIDOSCOPY;  Surgeon: [name], MD;  Location: [facility] ENDOSCOPY;  Service: Gastroenterology;  Laterality: N/A;  . LAPAROTOMY N/A 08/31/2017   Procedure: EXPLORATORY LAPAROTOMY for SBO, ileocolectomy, removal of piece of uterine wall;  Surgeon: [name], MD;  Location:  [facility] ORS;  Service: General;  Laterality: N/A;  . LASER ABLATION CONDOLAMATA N/A 02/22/2018   Procedure: LASER ABLATION/REMOVAL OF CONDOLAMATA AROUND ANUS AND VAGINA;  Surgeon: Michael Boston, MD;  Location: [place];  Service: General;  Laterality: N/A;  .  OOPHORECTOMY    . PORTA CATH INSERTION N/A 05/13/2018   Procedure: PORTA CATH INSERTION;  Surgeon: Katha Cabal, MD;  Location: [place] [facility];  Service: Cardiovascular;  Laterality: N/A;  . SMALL INTESTINE SURGERY    . TANDEM RING INSERTION     x3  . THORACOTOMY    . TOTAL KNEE ARTHROPLASTY Left 04/24/2018   Procedure: TOTAL KNEE ARTHROPLASTY;  Surgeon: Lovell Sheehan, MD;  Location: [facility] ORS;  Service: Orthopedics;  Laterality: Left;    SOCIAL HISTORY: Social History   Socioeconomic History  . Marital status: Divorced    Spouse name: Not on file  . Number of children: Not on file  . Years of education: Not on file  . Highest education level: Not on file  Occupational History  . Not on file  Social Needs  . Financial resource strain: Not hard at all  . Food insecurity:    Worry: Never true    Inability: Never true  . Transportation needs:    Medical: No    Non-medical: No  Tobacco Use  . Smoking status: Former Smoker    Last attempt to quit: 10/16/2006    Years since quitting: 12.2  . Smokeless tobacco: Never Used  Substance and Sexual Activity  . Alcohol use: Not Currently    Frequency: Never    Comment: seldom  . Drug use: Yes    Types: Marijuana  . Sexual activity: Not Currently    Birth control/protection: Post-menopausal    Comment: Not Asked  Lifestyle  . Physical activity:    Days per week: Patient refused    Minutes per session: Patient refused  . Stress: Only a little  Relationships  . Social connections:    Talks on phone: Patient refused    Gets together: Patient refused    Attends religious service: Patient refused    Active member of club or organization: Patient refused    Attends meetings of clubs or organizations: Patient refused    Relationship status: Patient refused  . Intimate partner violence:    Fear of current or ex partner: No    Emotionally abused: No    Physically abused: No    Forced sexual activity: No  Other Topics  Concern  . Not on file  Social History Narrative  . Not on file    FAMILY HISTORY: Family History  Problem Relation Age of Onset  . Hypertension Father   . Diabetes Father   . Alcohol abuse Daughter   . Hypertension Maternal Grandmother   . Diabetes Maternal Grandmother   . Diabetes Paternal Grandmother   . Hypertension Paternal Grandmother     ALLERGIES:  is allergic to ketamine.  MEDICATIONS:  Current Outpatient Medications  Medication Sig Dispense Refill  . amitriptyline (ELAVIL) 75 MG tablet Take 1 tablet (75 mg total) by mouth at bedtime. 90 tablet 1  . budesonide (ENTOCORT EC) 3 MG 24 hr capsule Take 3 capsules (9 mg total) by mouth daily. 30 capsule 0  . Calcium Carb-Cholecalciferol (CALCIUM 500 +D) 500-400 MG-UNIT TABS Take 2 tablets by mouth daily.    . cephALEXin (KEFLEX) 500 MG capsule Take 1 capsule (500 mg total) by mouth 4 (four) times daily for  10 days. 40 capsule 0  . diphenoxylate-atropine (LOMOTIL) 2.5-0.025 MG tablet Take 2 tablets by mouth 4 (four) times daily. (Patient taking differently: Take 4 tablets by mouth 4 (four) times daily. ) 30 tablet 0  . famotidine (PEPCID) 20 MG tablet Take 1 tablet (20 mg total) by mouth 2 (two) times daily as needed for heartburn or indigestion.    . hydrocortisone (CORTEF) 10 MG tablet Take 1 tablet (10 mg total) by mouth 2 (two) times daily. 180 tablet 0  . hyoscyamine (LEVBID) 0.375 MG 12 hr tablet Take 1 tablet (0.375 mg total)by mouth 2 (two) times daily. 60 tablet 0  . Multiple Vitamins-Minerals (MULTIVITAMIN WITH MINERALS) tablet Take 1 tablet by mouth daily. 30 tablet 0  . ondansetron (ZOFRAN ODT) 4 MG disintegrating tablet Take 1 tablet (4 mg total) by mouth every 8 (eight) hours as needed for nausea or vomiting. 20 tablet 0  . oxyCODONE (OXYCONTIN) 15 mg 12 hr tablet Take 1 tablet (15 mg total) by mouth every 12 (twelve) hours. 30 tablet 0  . Oxycodone HCl 10 MG TABS Take 1 tablet (10 mg total) by mouth every 6 (six)  hours as needed. 60 tablet 0  . pantoprazole (PROTONIX) 40 MG tablet Take 1 tablet (40 mg total) by mouth daily. 90 tablet 0  . potassium chloride SA (K-DUR,KLOR-CON) 20 MEQ tablet Take 1 tablet (20 mEq total) by mouth daily. 30 tablet 3  . promethazine (PHENERGAN) 25 MG tablet Take 1 tablet (25 mg total) by mouth every 8 (eight) hours as needed for nausea or vomiting. 30 tablet 3  . rivaroxaban (XARELTO) 20 MG TABS tablet Take 20 mg by mouth daily with supper.    . VENTOLIN HFA 108 (90 Base) MCG/ACT inhaler Inhale 1-2 puffs into the lungs every 4 (four) hours as needed for shortness of breath.   0  . VIBERZI 100 MG TABS TAKE 1 TABLET BY MOUTH TWICE DAILY WITH  A  MEAL 60 tablet 0  . ondansetron (ZOFRAN) 8 MG tablet Take 1 tablet (8 mg total) by mouth every 8 (eight) hours as needed for nausea or vomiting. (Patient not taking: Reported on 01/22/2019) 30 tablet 1   No current facility-administered medications for this visit.    Facility-Administered Medications Ordered in Other Visits  Medication Dose Route Frequency Provider Last Rate Last Dose  . 0.9 %  sodium chloride infusion   Intravenous Continuous Cammie Sickle, MD 999 mL/hr at 01/22/19 1117    . heparin lock flush 100 unit/mL  500 Units Intravenous Once Corcoran, Melissa C, MD      . heparin lock flush 100 unit/mL  500 Units Intracatheter Once Charlaine Dalton R, MD      . magnesium sulfate IVPB 4 g 100 mL  4 g Intravenous Once Cammie Sickle, MD 50 mL/hr at 01/22/19 1130 4 g at 01/22/19 1130  . sodium chloride 0.9 % 1,000 mL with potassium chloride 20 mEq infusion   Intravenous Once Honor Loh E, NP          .  PHYSICAL EXAMINATION: ECOG PERFORMANCE STATUS: 1 - Symptomatic but completely ambulatory  Vitals:   01/22/19 1027 01/22/19 1030  BP:  120/84  Pulse:  85  Resp: (!) 22   Temp:  99.1 F (37.3 C)   Filed Weights   01/22/19 1027  Weight: 178 lb (80.7 kg)    Physical Exam  Constitutional: She is  oriented to person, place, and time and well-developed, well-nourished, and in  no distress.  Alone.  Walking herself.  Complains of pain in the bilateral lower pelvic region.  HENT:  Head: Normocephalic and atraumatic.  Mouth/Throat: Oropharynx is clear and moist. No oropharyngeal exudate.  Eyes: Pupils are equal, round, and reactive to light.  Neck: Normal range of motion. Neck supple.  Cardiovascular: Normal rate and regular rhythm.  Pulmonary/Chest: Effort normal and breath sounds normal. No respiratory distress. She has no wheezes.  Abdominal: Soft. Bowel sounds are normal. She exhibits no distension and no mass. There is abdominal tenderness. There is no rebound and no guarding.  Mild tenderness in the lower abdominal region.  No rigidity or guarding.  Musculoskeletal: Normal range of motion.        General: No tenderness or edema.  Neurological: She is alert and oriented to person, place, and time.  Skin: Skin is warm.  Psychiatric: Affect normal.     LABORATORY DATA:  I have reviewed the data as listed Lab Results  Component Value Date   WBC 4.2 01/22/2019   HGB 8.7 (L) 01/22/2019   HCT 26.0 (L) 01/22/2019   MCV 107.4 (H) 01/22/2019   PLT 80 (L) 01/22/2019   Recent Labs    06/11/18 0510  07/08/18 1035  10/21/18 0845  01/15/19 0845 01/20/19 1340 01/22/19 1004  NA 135   < > 136   < > 134*   < > 136 136 137  K 3.9   < > 4.2   < > 3.9   < > 3.2* 3.9 3.9  CL 102   < > 107   < > 98   < > 106 105 105  CO2 27   < > 22   < > 24   < > 23 25 27   GLUCOSE 82   < > 119*   < > 111*   < > 98 98 89  BUN 11   < > 15   < > 17   < > 6 7 13   CREATININE 0.63   < > 0.71   < > 0.91   < > 0.62 0.58 0.67  CALCIUM 8.9   < > 8.7*   < > 9.6   < > 8.6* 9.1 8.7*  GFRNONAA >60   < > >60   < > >60   < > >60 >60 >60  GFRAA >60   < > >60   < > >60   < > >60 >60 >60  PROT 7.0   < > 6.5   < > 8.6*   < > 7.3 7.3 6.7  ALBUMIN 3.5   < > 3.4*   < > 4.4   < > 3.5 3.5 3.4*  AST 41   < > 26   < > 48*   <  > 30 186* 33  ALT 51*   < > 33   < > 67*   < > 32 86* 40  ALKPHOS 171*   < > 132*   < > 185*   < > 190* 243* 183*  BILITOT 0.7   < > 0.4   < > 1.4*   < > 0.5 0.5 0.4  BILIDIR 0.1  --  <0.1  --  0.3*  --   --   --   --   IBILI 0.6  --  NOT CALCULATED  --   --   --   --   --   --    < > = values in this  interval not displayed.    RADIOGRAPHIC STUDIES: I have personally reviewed the radiological images as listed and agreed with the findings in the report. Dg Abdomen Acute W/chest  Result Date: 12/26/2018 CLINICAL DATA:  Chest pain starting in our ago. Nausea, vomiting, and diarrhea. EXAM: DG ABDOMEN ACUTE W/ 1V CHEST COMPARISON:  Abdomen 11/26/2018 FINDINGS: Power port type central venous catheter with tip over the low SVC region. Normal heart size and pulmonary vascularity. No focal airspace disease or consolidation in the lungs. No blunting of costophrenic angles. No pneumothorax. Mediastinal contours appear intact. Degenerative changes in the spine and shoulders. Scattered gas and stool throughout the colon. No small or large bowel distention. No free intra-abdominal air. No abnormal air-fluid levels. Surgical clips in the right upper quadrant and right mid abdomen. Calcified phleboliths in the pelvis. No radiopaque stones. Degenerative changes in the spine. Soft tissue contours appear intact. IMPRESSION: No evidence of active pulmonary disease. Normal nonobstructive bowel gas pattern. Electronically Signed   By: Lucienne Capers M.D.   On: 12/26/2018 00:12    ASSESSMENT & PLAN:   Malignant neoplasm of overlapping sites of cervix [facility]) #Cervical adenocarcinoma with metastasis to the lung-currently on carbotaxol.  CT scan chest-February 2020-bilateral up to a centimeter sized lung nodules.  STABLE.Currently status post carbotaxol cycle #7appx. 3 weeks ago.  # Today labs hemoglobin-8.7; platelets 80.  Secondary to carboplatin bone marrow suppression.  Recommend discontinuation of carbo but moving  forward.  Recommend Taxol weekly times three 1 week off.  # Left LE pain/thigh- ? DVT [currently on xarelto]  # UTI- Klebsiella- On keflex [days ago].Marland Kitchen  #Hypomagnesemia-secondary platinum chemotherapy-mag 1.5-  proceed with 4 g magnesium today.stable.   #Chronic right lower quadrant abdominal pain/scar tissue versus others-clinically stable continue oxycodone.see above.   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   #History of DVT/PE-continue Xarelto. See above.  # adrenal insuffiency- on maintenance steroids-stable.   # DISPOSITION: # magnseium IV 4 gm/ 2 hours today/IVFs.  # cancel chemo tomorrow.  # LE venous dopplers- STAT  # follow up in 1 week-MDcbc/bmp/mag/ONLY taxol;Mag infusion 2 hours-Dr.B     All questions were answered. The patient knows to call the clinic with any problems, questions or concerns.       Cammie Sickle, MD 01/22/2019 11:44 AM

## 2019-01-23 ENCOUNTER — Inpatient Hospital Stay: Payer: Medicaid Other

## 2019-01-23 ENCOUNTER — Other Ambulatory Visit: Payer: Self-pay

## 2019-01-23 ENCOUNTER — Ambulatory Visit
Admission: RE | Admit: 2019-01-23 | Discharge: 2019-01-23 | Disposition: A | Discharge status: Home / Self Care | Payer: Medicaid Other | Source: Ambulatory Visit | Attending: Internal Medicine | Admitting: Internal Medicine

## 2019-01-23 ENCOUNTER — Inpatient Hospital Stay (HOSPITAL_BASED_OUTPATIENT_CLINIC_OR_DEPARTMENT_OTHER): Payer: Medicaid Other | Admitting: Oncology

## 2019-01-23 ENCOUNTER — Other Ambulatory Visit: Payer: Self-pay | Admitting: Internal Medicine

## 2019-01-23 ENCOUNTER — Ambulatory Visit: Admission: RE | Admit: 2019-01-23 | Payer: Medicaid Other | Source: Ambulatory Visit

## 2019-01-23 ENCOUNTER — Telehealth: Payer: Self-pay | Admitting: *Deleted

## 2019-01-23 VITALS — BP 127/82 | HR 76 | Temp 97.5°F | Resp 20

## 2019-01-23 DIAGNOSIS — C538 Malignant neoplasm of overlapping sites of cervix uteri: Secondary | ICD-10-CM

## 2019-01-23 DIAGNOSIS — R1030 Lower abdominal pain, unspecified: Secondary | ICD-10-CM | POA: Diagnosis not present

## 2019-01-23 DIAGNOSIS — Z5111 Encounter for antineoplastic chemotherapy: Secondary | ICD-10-CM | POA: Diagnosis not present

## 2019-01-23 MED ORDER — HEPARIN SOD (PORK) LOCK FLUSH 100 UNIT/ML IV SOLN
500.0000 [IU] | Freq: Once | INTRAVENOUS | Status: AC
  Administered 2019-01-23: 500 [IU] via INTRAVENOUS
  Filled 2019-01-23: qty 5

## 2019-01-23 MED ORDER — IOHEXOL 300 MG/ML  SOLN
85.0000 mL | Freq: Once | INTRAMUSCULAR | Status: AC | PRN
  Administered 2019-01-23: 13:00:00 85 mL via INTRAVENOUS

## 2019-01-23 MED ORDER — SODIUM CHLORIDE 0.9 % IV SOLN
INTRAVENOUS | Status: DC
  Administered 2019-01-23: 1000 mL via INTRAVENOUS
  Filled 2019-01-23 (×2): qty 250

## 2019-01-23 MED ORDER — MORPHINE SULFATE 2 MG/ML IJ SOLN
2.0000 mg | Freq: Once | INTRAMUSCULAR | Status: AC
  Administered 2019-01-23: 2 mg via INTRAVENOUS
  Filled 2019-01-23: qty 1

## 2019-01-23 MED ORDER — SULFAMETHOXAZOLE-TRIMETHOPRIM 800-160 MG PO TABS: 1.0000 | ORAL_TABLET | Freq: Two times a day (BID) | ORAL | 0 refills | Status: DC

## 2019-01-23 MED ORDER — SODIUM CHLORIDE 0.9% FLUSH
10.0000 mL | INTRAVENOUS | Status: DC | PRN
  Administered 2019-01-23: 10 mL via INTRAVENOUS
  Filled 2019-01-23: qty 10

## 2019-01-23 NOTE — Telephone Encounter (Signed)
Patient called reporting that she knows the [place] yesterday was negative, but she is hurting really bad and can't even walk today with pain in her groin into her back. She is asking if she should go to the ER. Please advise

## 2019-01-23 NOTE — Progress Notes (Signed)
Symptom Management Consult note [facility]  [phone] Fax:([phone]) [phone]  Patient Care Team: Arnetha Courser, MD as PCP - General (Family Medicine) Mellody Drown, MD as Consulting Physician (Obstetrics and Gynecology) Lenor Coffin, MD as Attending Physician (Gynecology) Lequita Asal, MD as Medical Oncologist (Hematology and Oncology) [name], MD as Consulting Physician (Gastroenterology) Michael Boston, MD as Consulting Physician (General Surgery) Lovell Sheehan, MD as Consulting Physician (Orthopedic Surgery) Clent Jacks, RN as Registered Nurse   Name of the patient: Joanna Hall  [id number]  September 26, 1966   Date of visit: 01/23/2019  Diagnosis: Cervical cancer  Chief Complaint: Abdominal Pain/pelvic pain  Current Treatment: Carbo/taxol. Recently d/c carbo d/t intolerable side effects (diarrhea)  Oncology History:  Patient last evaluated by primary oncologist Dr. Rogue Bussing  yesterday where chemotherapy was held due to bone marrow suppression.  Hemoglobin 8.7 and platelet count of 80.  Was recommended to discontinue [place] moving forward and continue with Taxol weekly (3 weeks on 1 week off).  Complained of left lower extremity pain.  Left leg ultrasound performed-negative.  Found to have a UTI with > 1000 colonies of Klebsiella and E. coli.  Placed on Keflex several days ago while in the emergency room.  Complained of chronic right lower quadrant abdominal pain.  Subsequently had CT abdomen today which did not reveal any acute changes but chronic inflammation likely d/t previous radiation.  Continuing oxycodone as prescribed.  Recently evaluated in the ED on 01/20/2019 for chief complaint of weakness.  Scheduled to be seen in [facility] for IV fluids.  Her mother who is currently admitted to the hospital as a coronavirus rule out and unfortunately she was unable to be seen in clinic today.  She was treated with IV  fluids, IV magnesium and IV Rocephin. Started on Keflex for suspected UTI.   Mother was negative for coronavirus.  Oncology History   # dec 2017- CERVICAL ADENO CA [[place]]; Stage IB [Dr. Christene Slates at [facility] in [place], [place];No surgery- Chemo-RT;  # 2018-sep - lung nodules [in [facility]]  # AUG 20th, 2019-#1 Carbo-Taxol s/p 6 cycles- [No avastin sec to blood clots]: Feb 2020-bilateral lung nodules with 1 cm in size.  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  She received treatment from 11/2016-05/2017.  01/2017 cisplatin x 2 and carboplatin x 1 (01/29/2017) due to ARF and XRT.  XRT was followed by T & O on 02/01/2017 and T & N 02/10/2017 and 02/20/2017.  Course was complicated by 80 pound weight loss, nausea, vomiting, electrolyte wasting (potassium and magnesium).  She describes that.  Is been sick constantly requiring at least 20 hospitalizations.  Follow-up CT chest and PET on 06/2017. Per patient, 'radiation worked' and no disease in the abdomen.  At that time she was noted to have lung nodules that were growing and follow-up imaging was scheduled for 10/2017.  She was admitted to hospital in [place] for small bowel obstruction which was managed conservatively and she was home for a week prior to traveling to [place] for Thanksgiving holiday where she has family.  She presented to ER in [place] on 08/2017 with nausea, vomiting, and lower abdominal pain.  Symptoms did not respond to conservative  treatment.  CT on 08/26/2017 revealed small bowel obstruction with transition in the pelvis just superior to the uterus rather was a long segment of  distal ileum with fatty wall thickening compatible with chronic inflammation and/or radiation enteritis. Imaging showed numerous pulmonary nodules consistent with metastatic disease. She underwent laparotomy and right ileocolectomy on 08/31/2017 at [facility].  Surgical findings revealed a thickened, matted, and scarred piece of distal small bowel close to the ileocecal valve.  She was discharged on 09/05/2017.  Pain markedly increased in intensity and imaging was performed on 09/11/2017 which revealed: Debris within the anterior abdominal wall incision concerning for infection versus packing material, s/p post ileo-colectomy with expected postoperative changes, mild colonic ileus, numerous pulmonary nodules highly concerning for metastatic disease, punctate nonobstructing nephrolithiasis.  Staples were removed and one was packed.  She was started on doxycycline.  Abdominal and pelvic CT without contrast on 09/11/2017 revealed debris within anterior abdominal wall incision concerning for infection, versus packing material.She is s/pileocolectomy with expected postoperative changes and mild colonic ileus. There were numerous pulmonary nodules highly concerning for metastatic diseaseand punctate nonobstructing nephrolithiasis. She was readmitted on 09/12/2017. She describes the onset of lower abdominal pain on 09/09/2017. Pain markedly increased in intensity on 09/11/2017.  Staples were removed and the wound packed. She was started on doxycycline.  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  PET scan was ordered to evaluate  enlarging lung nodules with concern for recurrent cervical cancer but scan was delayed due to insurance and need to be performed in [place].  Presented to ER on 12/03/2017 for abdominal pain and emesis.  Imaging concerning for worsening possible uterine infarct and she was admitted to hospital.  Pelvic MRI was unremarkable.  Remote scarring type changes of uterus thought to be possibly related to radiation.  She was discharged on 12/08/2017.  Underwent endoscopy and colonoscopy on 12/20/2017.    On 02/22/2018 she underwent laser ablation of condylomata around the anus and vagina under anesthesia with Dr. Johney Maine.   04/15/2018: Chest, abdomen, and pelvis CTrevealed innumerable (>100) cavitary nodules scattered in the lungs, moderately enlarging compared to the 11/08/2017 PET-CT, suspicious for metastatic disease. One index node in the RLL measures 1.0 x 1.1 cm (previously 0.6 x 0.6 cm). There were no new nodules. There was an ill-defined wall thickening in the rectosigmoid with surrounding stranding along fascia planes, probably sequela from prior radiation therapy. There was multilevel lumbar impingement due to spondylosis and degenerative disc disease. There was heterogeneous enhancement in the uterus (some possibly from prior radiation therapy).  04/23/2018:PET scan revealednumerous scattered solid and cavitary nodules in the lungs stable increased in size compared to the prior PET-CT from 11/08/2017.Largest nodule was 1.1 cm in the LUL (SUV 1.9).These demonstratedlow-grade metabolic activity up to a maximum SUV of about 2.3, increased from 11/08/2017.   Case was discussed at tumor board on 04/25/2018. Consensus to pursue CT-guided biopsy (05/06/18) which revealed: Metastatic adenocarcinoma, morphologically consistent with cervical adenocarcinoma.  She has history of chronic hepatitis C which is managed by GI.  Hepatitis C genotype is 2a/2c.  She receives B12 injections for history of  B12 deficiency.  On 04/24/2018 she  underwent left total knee replacement with Dr. [name]. --------------------------------------------------------   DIAGNOSIS: Cervical adenocarcinoma  STAGE: IV        ;GOALS: Palliative  CURRENT/MOST RECENT THERAPY : Carbotaxol      Malignant neoplasm of overlapping sites of cervix ([place])   01/29/2019 -  Chemotherapy    The patient had PACLitaxel (TAXOL) 156 mg in sodium chloride 0.9 % 250 mL chemo infusion (</= 40m/m2), 80 mg/m2, Intravenous,  Once, 0 of 4 cycles  for chemotherapy treatment.      Subjective Data:  ECOG: 1 - Symptomatic but completely ambulatory    Subjective:     KAAMIYAH DERRICKis a 53y.o. female who presents for evaluation of abdominal pain/pelvic pain. Onset was 2 days ago. Symptoms have been gradually worsening. The pain is described as aching, cramping and pressure-like, and is 10/10 in intensity. Pain is located in the suprapubic region with radiation to back.  Aggravating factors: activity and bowel movement.  Alleviating factors: none. Associated symptoms: diarrhea and nausea. The patient denies constipation, fever, melena and vomiting.  The patient's history has been marked as reviewed and updated as appropriate.  Review of Systems A comprehensive review of systems was negative except for: Constitutional: positive for fatigue Gastrointestinal: positive for abdominal pain, diarrhea and nausea     Objective:    General appearance: alert, fatigued, moderate distress and pale Back: symmetric, no curvature. ROM normal. No CVA tenderness. Lungs: clear to auscultation bilaterally Abdomen: abnormal findings:  hyperactive bowel sounds and marked tenderness in the lower abdomen Pelvic: deferred Extremities: extremities normal, atraumatic, no cyanosis or edema Neurologic: Alert and oriented X 3, normal strength and tone. Normal symmetric reflexes. Normal coordination and gait    Assessment:    Abdominal pain, likely  secondary to known cervical cancer and scar tissue/radiation changes.    Plan:   Stage IV cervical cancer with metastatic disease to the lung: Status post cycle 7 carbo/Taxol.  Carbo has recently been discontinued due to worsening diarrhea.  Diarrhea: Chronic.  Recurrent problem.  Currently taking Viberzi, Lomotil and Cortef.  Abdominal/pelvic pain: CT abdomen ordered by Dr. BRogue Bussing  Results: No acute findings.  Nothing suggestive of worsening pelvic pain.  She has been treated for UTI with Keflex which was prescribed in the emergency room.  After reviewing culture and sensitivity, will switch her to Bactrim which should cover both E. coli and Klebsiella. UTI could be causing pelvic pain. She is also due for a pelvic exam by [facility]. If pelvic pain continues will get this scheduled sooner then next appt on 04/02/19.   Plan: Stat labs.  Magnesium 1.5.  She received 4 g magnesium yesterday. Collect urine for urine drug screen.  Currently pending.  In clinic administration: Give 1 L NaCl. Give 2 mg morphine once.  New/change in medications: Stop Keflex. Rx Bactrim 800-160 mg tablets twice daily x7 days. Change oxycodone 10 mg tablets from every 6 hours as needed to every 4-6 hours as needed for pain.  Continue OxyContin 15 mg every 12 hours as prescribed.    Disposition: RTC on 01/29/2019 for weekly Taxol and assessment by Dr. BRogue Bussing  Greater than 50% was spent in counseling and coordination of care with this patient including but not limited to discussion of the relevant topics above (See A&P) including, but not limited to diagnosis and management of acute and chronic medical conditions.   Thank you for allowing me to participate in the care of this very pleasant patient.  JFaythe Casa  NP 01/27/2019 8:13 AM

## 2019-01-23 NOTE — Telephone Encounter (Signed)
Patient informed of CT at 1130 and to be there at 1115. She was instructed to come to [facility] after CT though her appointment is scheduled for 115 in [facility] and IV fluids at 2 as [facility] said they can take her as soon as she completes her CT. Dr B or Jamesetta Geralds, please add IV fluid orders and pain medicine so they have orders when she gets to clinic. Patient aware and agrees to appts

## 2019-01-23 NOTE — Progress Notes (Signed)
Joanna Hall- I am ordering STAT CT Abdomen/ pelvis. She can come and see [facility] too/ will may be need fluids/ IV pain medication.

## 2019-01-24 ENCOUNTER — Inpatient Hospital Stay: Payer: Medicaid Other

## 2019-01-26 ENCOUNTER — Other Ambulatory Visit: Payer: Self-pay

## 2019-01-26 ENCOUNTER — Encounter: Payer: Self-pay | Admitting: Emergency Medicine

## 2019-01-26 ENCOUNTER — Emergency Department: Payer: Medicaid Other

## 2019-01-26 ENCOUNTER — Emergency Department
Admission: EM | Admit: 2019-01-26 | Discharge: 2019-01-26 | Disposition: A | Discharge status: Home / Self Care | Payer: Medicaid Other | Attending: Student in an Organized Health Care Education/Training Program | Admitting: Student in an Organized Health Care Education/Training Program

## 2019-01-26 DIAGNOSIS — I10 Essential (primary) hypertension: Secondary | ICD-10-CM | POA: Diagnosis not present

## 2019-01-26 DIAGNOSIS — C539 Malignant neoplasm of cervix uteri, unspecified: Secondary | ICD-10-CM | POA: Insufficient documentation

## 2019-01-26 DIAGNOSIS — Z9221 Personal history of antineoplastic chemotherapy: Secondary | ICD-10-CM | POA: Diagnosis not present

## 2019-01-26 DIAGNOSIS — Z79899 Other long term (current) drug therapy: Secondary | ICD-10-CM | POA: Insufficient documentation

## 2019-01-26 DIAGNOSIS — Z7901 Long term (current) use of anticoagulants: Secondary | ICD-10-CM | POA: Diagnosis not present

## 2019-01-26 DIAGNOSIS — Z96642 Presence of left artificial hip joint: Secondary | ICD-10-CM | POA: Insufficient documentation

## 2019-01-26 DIAGNOSIS — E860 Dehydration: Secondary | ICD-10-CM | POA: Diagnosis not present

## 2019-01-26 DIAGNOSIS — R531 Weakness: Secondary | ICD-10-CM | POA: Insufficient documentation

## 2019-01-26 DIAGNOSIS — Z87891 Personal history of nicotine dependence: Secondary | ICD-10-CM | POA: Insufficient documentation

## 2019-01-26 DIAGNOSIS — C7802 Secondary malignant neoplasm of left lung: Secondary | ICD-10-CM | POA: Insufficient documentation

## 2019-01-26 LAB — COMPREHENSIVE METABOLIC PANEL
ALT: 69 U/L — ABNORMAL HIGH (ref 0–44)
AST: 41 U/L (ref 15–41)
Albumin: 4 g/dL (ref 3.5–5.0)
Alkaline Phosphatase: 290 U/L — ABNORMAL HIGH (ref 38–126)
Anion gap: 11 (ref 5–15)
BUN: 11 mg/dL (ref 6–20)
CO2: 23 mmol/L (ref 22–32)
Calcium: 9.9 mg/dL (ref 8.9–10.3)
Chloride: 101 mmol/L (ref 98–111)
Creatinine, Ser: 0.9 mg/dL (ref 0.44–1.00)
GFR calc Af Amer: >60 mL/min (ref >60)
GFR calc non Af Amer: >60 mL/min (ref >60)
Glucose, Bld: 102 mg/dL — ABNORMAL HIGH (ref 70–99)
Potassium: 4.1 mmol/L (ref 3.5–5.1)
Sodium: 135 mmol/L (ref 135–145)
Total Bilirubin: 0.7 mg/dL (ref 0.3–1.2)
Total Protein: 8.4 g/dL — ABNORMAL HIGH (ref 6.5–8.1)

## 2019-01-26 LAB — CBC WITH DIFFERENTIAL/PLATELET
Abs Immature Granulocytes: 0.02 10*3/uL (ref 0.00–0.07)
Basophils Absolute: 0 10*3/uL (ref 0.0–0.1)
Basophils Relative: 0 %
Eosinophils Absolute: 0 10*3/uL (ref 0.0–0.5)
Eosinophils Relative: 0 %
HCT: 31.9 % — ABNORMAL LOW (ref 36.0–46.0)
Hemoglobin: 11 g/dL — ABNORMAL LOW (ref 12.0–15.0)
Immature Granulocytes: 1 %
Lymphocytes Relative: 13 %
Lymphs Abs: 0.5 10*3/uL — ABNORMAL LOW (ref 0.7–4.0)
MCH: 35.6 pg — ABNORMAL HIGH (ref 26.0–34.0)
MCHC: 34.5 g/dL (ref 30.0–36.0)
MCV: 103.2 fL — ABNORMAL HIGH (ref 80.0–100.0)
Monocytes Absolute: 0.3 10*3/uL (ref 0.1–1.0)
Monocytes Relative: 8 %
Neutro Abs: 3 10*3/uL (ref 1.7–7.7)
Neutrophils Relative %: 78 %
Platelets: 108 10*3/uL — ABNORMAL LOW (ref 150–400)
RBC: 3.09 MIL/uL — ABNORMAL LOW (ref 3.87–5.11)
RDW: 14.7 % (ref 11.5–15.5)
WBC: 3.8 10*3/uL — ABNORMAL LOW (ref 4.0–10.5)
nRBC: 0 % (ref 0.0–0.2)

## 2019-01-26 LAB — URINALYSIS, COMPLETE (UACMP) WITH MICROSCOPIC
Bacteria, UA: NONE SEEN
Bilirubin Urine: NEGATIVE
Glucose, UA: NEGATIVE mg/dL
Hgb urine dipstick: NEGATIVE
Ketones, ur: 5 mg/dL — AB
Nitrite: NEGATIVE
Protein, ur: 30 mg/dL — AB
Specific Gravity, Urine: 1.019 (ref 1.005–1.030)
pH: 6 (ref 5.0–8.0)

## 2019-01-26 LAB — MAGNESIUM: Magnesium: 1.4 mg/dL — ABNORMAL LOW (ref 1.7–2.4)

## 2019-01-26 MED ORDER — HEPARIN SOD (PORK) LOCK FLUSH 100 UNIT/ML IV SOLN
INTRAVENOUS | Status: AC
  Filled 2019-01-26: qty 5

## 2019-01-26 MED ORDER — SODIUM CHLORIDE 0.9 % IV BOLUS
1000.0000 mL | Freq: Once | INTRAVENOUS | Status: AC
  Administered 2019-01-26: 1000 mL via INTRAVENOUS

## 2019-01-26 MED ORDER — MECLIZINE HCL 25 MG PO TABS: 25.0000 mg | ORAL_TABLET | Freq: Two times a day (BID) | ORAL | 0 refills | Status: DC | PRN

## 2019-01-26 MED ORDER — PROMETHAZINE HCL 25 MG/ML IJ SOLN
12.5000 mg | Freq: Four times a day (QID) | INTRAMUSCULAR | Status: DC | PRN
  Administered 2019-01-26: 17:00:00 12.5 mg via INTRAVENOUS
  Filled 2019-01-26: qty 1

## 2019-01-26 MED ORDER — MECLIZINE HCL 25 MG PO TABS
25.0000 mg | ORAL_TABLET | Freq: Once | ORAL | Status: AC
  Administered 2019-01-26: 25 mg via ORAL
  Filled 2019-01-26: qty 1

## 2019-01-26 MED ORDER — MAGNESIUM SULFATE 2 GM/50ML IV SOLN
2.0000 g | Freq: Once | INTRAVENOUS | Status: AC
  Administered 2019-01-26: 2 g via INTRAVENOUS
  Filled 2019-01-26: qty 50

## 2019-01-26 NOTE — ED Notes (Signed)
Pt able to ambulate to toilet to provide urine sample. Pt became winded when up walking to toilet.

## 2019-01-26 NOTE — ED Notes (Signed)
Pt cleared by MD to have PO liquids. Pt informed that we need a urine specimen, cup left on counter in room, pt instructed to press call light when she feels like she can urinate.

## 2019-01-26 NOTE — ED Triage Notes (Signed)
Pt presents to ED via POV at this time with c/o weakness, chills, emesis, and blurry vision. Pt states is a cancer patient, being treated for metastatic carcinoma and cervical cancer with mets to the lungs. Pt c/o SOB and weakness. Pt states missed chemo on 4/8.

## 2019-01-26 NOTE — ED Provider Notes (Addendum)
[facility] Emergency Department Provider Note    First MD Initiated Contact with Patient 01/26/19 1541     (approximate)  I have reviewed the triage vital signs and the nursing notes.   HISTORY  Chief Complaint Blurred Vision; Weakness; Emesis; and Chills    HPI Joanna Hall is a 53 y.o. female extensive past medical history undergoing chemotherapy for cancer as listed below presents the ER for weakness feeling dehydrated associated with persistent chronic diarrhea as well as productive cough.  States that she is having fevers and chills but no measured temperatures.  Currently on antibiotics for UTI.  States that those symptoms have improved.  States that she feels lightheaded with blurry vision with standing.    Past Medical History:  Diagnosis Date  . Abdominal pain 06/10/2018  . Abnormal cervical Papanicolaou smear 09/18/2017  . Anxiety   . Aortic atherosclerosis ([place])   . Arthritis    neck and knees  . 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 Blood transfusion without reported diagnosis   . Cervical cancer ([place]) 09/2016   mets lung  . Chronic anal fissure   . Chronic diarrhea   . Dyspnea   . Erosive gastropathy 09/18/2017  . Factor V Leiden mutation ([place])   . Fecal incontinence   . Genital warts   . GERD (gastroesophageal reflux disease)   . Heart murmur   . Hematochezia   . Hemorrhoids   . 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 Hepatitis, chronic ([place]) 05/05/2017  . History of cancer chemotherapy    completed 06/2017  . History of Clostridium difficile infection    while undergoing chemo.  Negative test 10/2017  . Ileocolic anastomotic leak   . 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 Intestinal infection due to Clostridium difficile 09/18/2017  . Macrocytic anemia with vitamin B12 deficiency   . Multiple gastric ulcers   . Nausea vomiting and diarrhea   . Pancolitis ([place])  07/27/2018  . Perianal condylomata   . Pneumonia    History of  . Pulmonary nodules   . Rectal bleeding   . Small bowel obstruction ([place]) 08/2017  . Stiff neck    limited right turn  . Vitamin D deficiency    Family History  Problem Relation Age of Onset  . Hypertension Father   . Diabetes Father   . Alcohol abuse Daughter   . Hypertension Maternal Grandmother   . Diabetes Maternal Grandmother   . Diabetes Paternal Grandmother   . Hypertension Paternal Grandmother    Past Surgical History:  Procedure Laterality Date  . CHOLECYSTECTOMY    . COLON SURGERY  08/2017   resection  . COLONOSCOPY WITH PROPOFOL N/A 12/20/2017   Procedure: COLONOSCOPY WITH PROPOFOL;  Surgeon: [name], MD;  Location: [facility] ENDOSCOPY;  Service: Gastroenterology;  Laterality: N/A;  . COLONOSCOPY WITH PROPOFOL N/A 07/30/2018   Procedure: COLONOSCOPY WITH PROPOFOL;  Surgeon: [name], MD;  Location: [facility] ENDOSCOPY;  Service: Gastroenterology;  Laterality: N/A;  . COLONOSCOPY WITH PROPOFOL N/A 10/10/2018   Procedure: COLONOSCOPY WITH PROPOFOL;  Surgeon: Lucilla Lame, MD;  Location: [facility] ENDOSCOPY;  Service: Endoscopy;  Laterality: N/A;  . DIAGNOSTIC LAPAROSCOPY    . ESOPHAGOGASTRODUODENOSCOPY (EGD) WITH PROPOFOL N/A 12/20/2017   Procedure: ESOPHAGOGASTRODUODENOSCOPY (EGD) WITH PROPOFOL;  Surgeon: [name], MD;  Location: [place];  Service: Gastroenterology;  Laterality: N/A;  . ESOPHAGOGASTRODUODENOSCOPY (EGD) WITH PROPOFOL  07/30/2018   Procedure: ESOPHAGOGASTRODUODENOSCOPY (EGD) WITH PROPOFOL;  Surgeon: [name], MD;  Location: [facility] ENDOSCOPY;  Service: Gastroenterology;;  . Otho Darner SIGMOIDOSCOPY N/A 11/21/2018   Procedure: FLEXIBLE SIGMOIDOSCOPY;  Surgeon: [name], MD;  Location: [facility] ENDOSCOPY;  Service: Gastroenterology;  Laterality: N/A;  . LAPAROTOMY N/A 08/31/2017   Procedure: EXPLORATORY LAPAROTOMY for SBO, ileocolectomy, removal of piece of  uterine wall;  Surgeon: [name], MD;  Location: [facility] ORS;  Service: General;  Laterality: N/A;  . LASER ABLATION CONDOLAMATA N/A 02/22/2018   Procedure: LASER ABLATION/REMOVAL OF HYQMVHQIONG AROUND ANUS AND VAGINA;  Surgeon: Michael Boston, MD;  Location: [place];  Service: General;  Laterality: N/A;  . OOPHORECTOMY    . PORTA CATH INSERTION N/A 05/13/2018   Procedure: PORTA CATH INSERTION;  Surgeon: Katha Cabal, MD;  Location: [place] [facility];  Service: Cardiovascular;  Laterality: N/A;  . SMALL INTESTINE SURGERY    . TANDEM RING INSERTION     x3  . THORACOTOMY    . TOTAL KNEE ARTHROPLASTY Left 04/24/2018   Procedure: TOTAL KNEE ARTHROPLASTY;  Surgeon: Lovell Sheehan, MD;  Location: [facility] ORS;  Service: Orthopedics;  Laterality: Left;   Patient Active Problem List   Diagnosis Date Noted  . Cancer associated pain   . Proctitis, radiation   . Palliative care encounter   . Encounter for monitoring opioid maintenance therapy 11/19/2018  . GI bleed 10/08/2018  . Adrenal insufficiency ([place]) 09/03/2018  . Chronic diarrhea 06/25/2018  . Chronic anticoagulation 06/25/2018  . Vomiting   . Encounter for antineoplastic chemotherapy 06/04/2018  . 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 Lung metastasis ([place]) 05/15/2018  . Closed fracture of distal end of radius 05/04/2018  . History of total knee arthroplasty 04/24/2018  . Osteoarthritis of left knee 02/28/2018  . Condyloma acuminatum of anus s/p ablation 02/22/2018 02/22/2018  . Condyloma acuminatum of vagina s/p ablation 02/22/2018 02/22/2018  . Positive ANA (antinuclear antibody) 02/04/2018  . Aortic atherosclerosis ([place]) 01/15/2018  . Elevated MCV 01/15/2018  . Anemia 01/15/2018  . Genital warts 01/15/2018  . Vitamin D deficiency 01/15/2018  . Pernicious anemia   . Impingement syndrome of shoulder region 12/19/2017  . Multiple joint pain 12/19/2017  . Osteoarthritis of right knee 12/19/2017  . B12  deficiency 12/11/2017  . Lung nodules 12/11/2017  . History of Clostridium difficile infection 10/23/2017  . Factor V Leiden ([place]) 10/19/2017  . Goals of care, counseling/discussion 10/19/2017  . Cervical cancer, FIGO stage IB1 ([place]) 09/18/2017  . Chronic hepatitis C without hepatic coma ([place]) 09/18/2017  . Cytopenia 09/18/2017  . Diarrhea 09/18/2017  . Lower abdominal pain 09/18/2017  . 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 Malignant neoplasm of overlapping sites of cervix ([place]) 09/18/2017  . Renal insufficiency 09/18/2017  . Wound infection after surgery 09/12/2017  . Hypokalemia   . Hypomagnesemia   . Cervical arthritis 07/18/2017  . Dysuria 06/20/2017  . Metastatic cancer ([place]) 05/12/2017  . Essential hypertension 03/15/2017  . Anemia in other chronic diseases classified elsewhere 03/01/2017  . Chemotherapy-induced neutropenia ([place]) 01/29/2017  . 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 Authorizing Provider  amitriptyline (ELAVIL) 75 MG tablet Take 1 tablet (75 mg total) by mouth at bedtime. 11/11/18 02/09/19 Yes Vanga, Tally Due, MD  budesonide (ENTOCORT EC) 3 MG 24 hr capsule Take 3 capsules (  9 mg total) by mouth daily. 10/12/18  Yes Gouru, Illene Silver, MD  diphenoxylate-atropine (LOMOTIL) 2.5-0.025 MG tablet Take 2 tablets by mouth 4 (four) times daily. Patient taking differently: Take 4 tablets by mouth 4 (four) times daily.  11/26/18  Yes Gouru, Illene Silver, MD  famotidine (PEPCID) 20 MG tablet Take 1 tablet (20 mg total) by mouth 2 (two) times daily as needed for heartburn or indigestion. 11/25/18  Yes Gouru, Illene Silver, MD  hydrocortisone (CORTEF) 10 MG tablet Take 1 tablet (10 mg total) by mouth 2 (two) times daily. 11/11/18 02/09/19 Yes Vanga, Tally Due, MD  hyoscyamine (LEVBID) 0.375 MG 12 hr tablet Take 1 tablet (0.375 mg total)by mouth 2 (two) times daily. 01/03/19  Yes Vanga, Tally Due, MD   Multiple Vitamins-Minerals (MULTIVITAMIN WITH MINERALS) tablet Take 1 tablet by mouth daily. 08/01/18 08/01/19 Yes Wieting, Richard, MD  ondansetron (ZOFRAN ODT) 4 MG disintegrating tablet Take 1 tablet (4 mg total) by mouth every 8 (eight) hours as needed for nausea or vomiting. Patient taking differently: Take 4 mg by mouth every 4 (four) hours as needed for nausea or vomiting.  12/26/18  Yes Merlyn Lot, MD  oxyCODONE (OXYCONTIN) 15 mg 12 hr tablet Take 1 tablet (15 mg total) by mouth every 12 (twelve) hours. 01/20/19  Yes Jacquelin Hawking, NP  Oxycodone HCl 10 MG TABS Take 1 tablet (10 mg total) by mouth every 6 (six) hours as needed. Patient taking differently: Take 10 mg by mouth every 4 (four) hours as needed.  01/22/19  Yes Burns, Wandra Feinstein, NP  pantoprazole (PROTONIX) 40 MG tablet Take 1 tablet (40 mg total) by mouth daily. 10/30/18  Yes Karen Kitchens, NP  potassium chloride SA (K-DUR,KLOR-CON) 20 MEQ tablet Take 1 tablet (20 mEq total) by mouth daily. 01/01/19  Yes Cammie Sickle, MD  promethazine (PHENERGAN) 25 MG tablet Take 1 tablet (25 mg total) by mouth every 8 (eight) hours as needed for nausea or vomiting. 01/01/19  Yes Cammie Sickle, MD  rivaroxaban (XARELTO) 20 MG TABS tablet Take 20 mg by mouth daily with supper.   Yes [provider]  sulfamethoxazole-trimethoprim (BACTRIM DS,SEPTRA DS) 800-160 MG tablet Take 1 tablet by mouth 2 (two) times daily. 01/23/19  Yes Burns, Wandra Feinstein, NP  VENTOLIN HFA 108 (90 Base) MCG/ACT inhaler Inhale 1-2 puffs into the lungs every 4 (four) hours as needed for shortness of breath.    Yes [provider]  VIBERZI 100 MG TABS TAKE 1 TABLET BY MOUTH TWICE DAILY WITH  A  MEAL 10/11/18  Yes Verlon Au, NP  Calcium Carb-Cholecalciferol (CALCIUM 500 +D) 500-400 MG-UNIT TABS Take 2 tablets by mouth daily.    [provider]  meclizine (ANTIVERT) 25 MG tablet Take 1 tablet (25 mg total) by mouth 2 (two) times  daily as needed for dizziness. 01/26/19   Merlyn Lot, MD    Allergies Ketamine    Social History Social History   Tobacco Use  . Smoking status: Former Smoker    Last attempt to quit: 10/16/2006    Years since quitting: 12.2  . Smokeless tobacco: Never Used  Substance Use Topics  . Alcohol use: Not Currently    Frequency: Never    Comment: seldom  . Drug use: Yes    Types: Marijuana    Review of Systems Patient denies headaches, rhinorrhea, blurry vision, numbness, shortness of breath, chest pain, edema, cough, abdominal pain, nausea, vomiting, diarrhea, dysuria, fevers, rashes or hallucinations unless otherwise stated  above in HPI. ____________________________________________   PHYSICAL EXAM:  VITAL SIGNS: Vitals:   01/26/19 2100 01/26/19 2130  BP: 102/72 127/80  Pulse: 84   Resp: 17 (!) 23  Temp:    SpO2: 98%     Constitutional: Alert and oriented. Chronically ill appearing Eyes: Conjunctivae are normal.  Head: Atraumatic. Nose: No congestion/rhinnorhea. Mouth/Throat: Mucous membranes are moist.   Neck: No stridor. Painless ROM.  Cardiovascular: tachycardic, regular rhythm. Grossly normal heart sounds.  Good peripheral circulation. Respiratory: Normal respiratory effort.  No retractions. Lungs CTAB. Gastrointestinal: Soft mildly tender throughout. No focal peritonitis. No distention. No abdominal bruits. No CVA tenderness. Genitourinary: deferred Musculoskeletal: No lower extremity tenderness nor edema.  No joint effusions. Neurologic:  Normal speech and language. No gross focal neurologic deficits are appreciated. No facial droop Skin:  Skin is warm, dry and intact. No rash noted. Psychiatric: Mood and affect are normal. Speech and behavior are normal.  ____________________________________________   LABS (all labs ordered are listed, but only abnormal results are displayed)  Results for orders placed or performed during the hospital encounter of  01/26/19 (from the past 24 hour(s))  CBC with Differential/Platelet     Status: Abnormal   Collection Time: 01/26/19  3:54 PM  Result Value Ref Range   WBC 3.8 (L) 4.0 - 10.5 K/uL   RBC 3.09 (L) 3.87 - 5.11 MIL/uL   Hemoglobin 11.0 (L) 12.0 - 15.0 g/dL   HCT 31.9 (L) 36.0 - 46.0 %   MCV 103.2 (H) 80.0 - 100.0 fL   MCH 35.6 (H) 26.0 - 34.0 pg   MCHC 34.5 30.0 - 36.0 g/dL   RDW 14.7 11.5 - 15.5 %   Platelets 108 (L) 150 - 400 K/uL   nRBC 0.0 0.0 - 0.2 %   Neutrophils Relative % 78 %   Neutro Abs 3.0 1.7 - 7.7 K/uL   Lymphocytes Relative 13 %   Lymphs Abs 0.5 (L) 0.7 - 4.0 K/uL   Monocytes Relative 8 %   Monocytes Absolute 0.3 0.1 - 1.0 K/uL   Eosinophils Relative 0 %   Eosinophils Absolute 0.0 0.0 - 0.5 K/uL   Basophils Relative 0 %   Basophils Absolute 0.0 0.0 - 0.1 K/uL   Immature Granulocytes 1 %   Abs Immature Granulocytes 0.02 0.00 - 0.07 K/uL  Comprehensive metabolic panel     Status: Abnormal   Collection Time: 01/26/19  3:54 PM  Result Value Ref Range   Sodium 135 135 - 145 mmol/L   Potassium 4.1 3.5 - 5.1 mmol/L   Chloride 101 98 - 111 mmol/L   CO2 23 22 - 32 mmol/L   Glucose, Bld 102 (H) 70 - 99 mg/dL   BUN 11 6 - 20 mg/dL   Creatinine, Ser 0.90 0.44 - 1.00 mg/dL   Calcium 9.9 8.9 - 10.3 mg/dL   Total Protein 8.4 (H) 6.5 - 8.1 g/dL   Albumin 4.0 3.5 - 5.0 g/dL   AST 41 15 - 41 U/L   ALT 69 (H) 0 - 44 U/L   Alkaline Phosphatase 290 (H) 38 - 126 U/L   Total Bilirubin 0.7 0.3 - 1.2 mg/dL   GFR calc non Af Amer >60 >60 mL/min   GFR calc Af Amer >60 >60 mL/min   Anion gap 11 5 - 15  Magnesium     Status: Abnormal   Collection Time: 01/26/19  3:54 PM  Result Value Ref Range   Magnesium 1.4 (L) 1.7 - 2.4 mg/dL  Urinalysis, Complete w Microscopic     Status: Abnormal   Collection Time: 01/26/19  5:08 PM  Result Value Ref Range   Color, Urine YELLOW (A) YELLOW   APPearance HAZY (A) CLEAR   Specific Gravity, Urine 1.019 1.005 - 1.030   pH 6.0 5.0 - 8.0    Glucose, UA NEGATIVE NEGATIVE mg/dL   Hgb urine dipstick NEGATIVE NEGATIVE   Bilirubin Urine NEGATIVE NEGATIVE   Ketones, ur 5 (A) NEGATIVE mg/dL   Protein, ur 30 (A) NEGATIVE mg/dL   Nitrite NEGATIVE NEGATIVE   Leukocytes,Ua TRACE (A) NEGATIVE   RBC / HPF 0-5 0 - 5 RBC/hpf   WBC, UA 11-20 0 - 5 WBC/hpf   Bacteria, UA NONE SEEN NONE SEEN   Squamous Epithelial / LPF 0-5 0 - 5   Mucus PRESENT    Hyaline Casts, UA PRESENT    ____________________________________________  EKG My review and personal interpretation at Time: 16:16   Indication: weakness  Rate: 100  Rhythm: sinus Axis: normal  Other: normal intervals no stemi ____________________________________________  RADIOLOGY  I personally reviewed all radiographic images ordered to evaluate for the above acute complaints and reviewed radiology reports and findings.  These findings were personally discussed with the patient.  Please see medical record for radiology report.  ____________________________________________   PROCEDURES  Procedure(s) performed:  Procedures    Critical Care performed: no ____________________________________________   INITIAL IMPRESSION / ASSESSMENT AND PLAN / ED COURSE  Pertinent labs & imaging results that were available during my care of the patient were reviewed by me and considered in my medical decision making (see chart for details).   DDX: dehydration, uti, colitis, pna, viral illness, electrolyte abn  MYRAKLE WINGLER is a 53 y.o. who presents to the ED with weakness blurry vision and dehydration and nausea as described above.  Abdominal exam soft and benign.  She is already on anticoagulation so does not seem clinically consistent with PE.  EKG shows no evidence of dysrhythmia or ischemia.  She is nontoxic-appearing but does appear dehydrated.  We will give IV fluids.  Will check mag and blood work.  No signs or symtpoms to suggest covid 19.  Will check blood work, cxr and reassess. will  Check urinalysis.  Clinical Course as of Jan 26 2236  Sun Jan 26, 2019  6553 Patient rechecked.  Feeling some improvement after IV fluids.  States that she still feels weak and dehydrated.  We will continue with IV hydration.   [PR]  2006 Patient mag level again low.  Will give IV magnesium.  She remains afebrile and hemodynamically stable.   [PR]  2133 Patient states that she still feels dizzy feels like the room is spinning around.  Concerning for vertigo.  Will give meclizine and reassess.   [PR]  2231 Reassessed had significant improvement after meclizine.   [PR]    Clinical Course User Index [PR] Merlyn Lot, MD    The patient was evaluated in Emergency Department today for the symptoms described in the history of present illness. He/she was evaluated in the context of the global COVID-19 pandemic, which necessitated consideration that the patient might be at risk for infection with the SARS-CoV-2 virus that causes COVID-19. Institutional protocols and algorithms that pertain to the evaluation of patients at risk for COVID-19 are in a state of rapid change based on information released by regulatory bodies including the CDC and federal and state organizations. These policies and algorithms were followed during the patient's care  in the ED.  As part of my medical decision making, I reviewed the following data within the [facility] notes reviewed and incorporated, Labs reviewed, notes from prior ED visits.   ____________________________________________   FINAL CLINICAL IMPRESSION(S) / ED DIAGNOSES  Final diagnoses:  Weakness  Dehydration  Hypomagnesemia      NEW MEDICATIONS STARTED DURING THIS VISIT:  New Prescriptions   MECLIZINE (ANTIVERT) 25 MG TABLET    Take 1 tablet (25 mg total) by mouth 2 (two) times daily as needed for dizziness.     Note:  This document was prepared using Dragon voice recognition software and may include  unintentional dictation errors.    Merlyn Lot, MD 01/26/19 2131    Merlyn Lot, MD 01/26/19 2237

## 2019-01-27 LAB — DRUG SCREEN 764883 11+OXYCO+ALC+CRT-BUND
Amphetamines, Urine: NEGATIVE ng/mL
BENZODIAZ UR QL: NEGATIVE ng/mL
Barbiturate: NEGATIVE ng/mL
Cocaine (Metabolite): NEGATIVE ng/mL
Creatinine: 32.5 mg/dL (ref 20.0–300.0)
Ethanol: NEGATIVE %
Meperidine: NEGATIVE ng/mL
Methadone Screen, Urine: NEGATIVE ng/mL
Phencyclidine: NEGATIVE ng/mL
Propoxyphene, Urine: NEGATIVE ng/mL
Tramadol: NEGATIVE ng/mL
pH, Urine: 6 (ref 4.5–8.9)

## 2019-01-27 LAB — OXYCODONE/OXYMORPHONE, CONFIRM
OXYCODONE/OXYMORPH: POSITIVE — AB
OXYCODONE: 1918 ng/mL
OXYCODONE: POSITIVE — AB
OXYMORPHONE (GC/MS): 1983 ng/mL
OXYMORPHONE: POSITIVE — AB

## 2019-01-27 LAB — OPIATES CONFIRMATION, URINE
Codeine: NEGATIVE
Hydrocodone: NEGATIVE
Hydromorphone: NEGATIVE
Morphine Confirm: 1445 ng/mL
Morphine: POSITIVE — AB
Opiates: POSITIVE ng/mL — AB

## 2019-01-27 LAB — CANNABINOID CONFIRMATION, UR
CANNABINOIDS: POSITIVE — AB
Carboxy THC GC/MS Conf: >400 ng/mL

## 2019-01-28 ENCOUNTER — Inpatient Hospital Stay: Payer: Medicaid Other

## 2019-01-28 ENCOUNTER — Telehealth: Payer: Self-pay | Admitting: *Deleted

## 2019-01-28 ENCOUNTER — Other Ambulatory Visit: Payer: Self-pay

## 2019-01-28 DIAGNOSIS — C538 Malignant neoplasm of overlapping sites of cervix uteri: Secondary | ICD-10-CM

## 2019-01-28 DIAGNOSIS — Z5111 Encounter for antineoplastic chemotherapy: Secondary | ICD-10-CM | POA: Diagnosis not present

## 2019-01-28 DIAGNOSIS — E860 Dehydration: Secondary | ICD-10-CM

## 2019-01-28 LAB — CBC WITH DIFFERENTIAL/PLATELET
Abs Immature Granulocytes: 0.01 10*3/uL (ref 0.00–0.07)
Basophils Absolute: 0 10*3/uL (ref 0.0–0.1)
Basophils Relative: 0 %
Eosinophils Absolute: 0 10*3/uL (ref 0.0–0.5)
Eosinophils Relative: 0 %
HCT: 30.3 % — ABNORMAL LOW (ref 36.0–46.0)
Hemoglobin: 10.3 g/dL — ABNORMAL LOW (ref 12.0–15.0)
Immature Granulocytes: 0 %
Lymphocytes Relative: 16 %
Lymphs Abs: 0.7 10*3/uL (ref 0.7–4.0)
MCH: 35.2 pg — ABNORMAL HIGH (ref 26.0–34.0)
MCHC: 34 g/dL (ref 30.0–36.0)
MCV: 103.4 fL — ABNORMAL HIGH (ref 80.0–100.0)
Monocytes Absolute: 0.3 10*3/uL (ref 0.1–1.0)
Monocytes Relative: 7 %
Neutro Abs: 3.3 10*3/uL (ref 1.7–7.7)
Neutrophils Relative %: 77 %
Platelets: 114 10*3/uL — ABNORMAL LOW (ref 150–400)
RBC: 2.93 MIL/uL — ABNORMAL LOW (ref 3.87–5.11)
RDW: 14.7 % (ref 11.5–15.5)
WBC: 4.2 10*3/uL (ref 4.0–10.5)
nRBC: 0 % (ref 0.0–0.2)

## 2019-01-28 LAB — BASIC METABOLIC PANEL
Anion gap: 8 (ref 5–15)
BUN: 15 mg/dL (ref 6–20)
CO2: 22 mmol/L (ref 22–32)
Calcium: 9.2 mg/dL (ref 8.9–10.3)
Chloride: 103 mmol/L (ref 98–111)
Creatinine, Ser: 1.26 mg/dL — ABNORMAL HIGH (ref 0.44–1.00)
GFR calc Af Amer: 57 mL/min — ABNORMAL LOW (ref >60)
GFR calc non Af Amer: 49 mL/min — ABNORMAL LOW (ref >60)
Glucose, Bld: 103 mg/dL — ABNORMAL HIGH (ref 70–99)
Potassium: 4.1 mmol/L (ref 3.5–5.1)
Sodium: 133 mmol/L — ABNORMAL LOW (ref 135–145)

## 2019-01-28 LAB — MAGNESIUM: Magnesium: 1.4 mg/dL — ABNORMAL LOW (ref 1.7–2.4)

## 2019-01-28 MED ORDER — SODIUM CHLORIDE 0.9 % IV SOLN
INTRAVENOUS | Status: DC
  Administered 2019-01-28: 14:00:00 via INTRAVENOUS
  Filled 2019-01-28 (×2): qty 250

## 2019-01-28 MED ORDER — HEPARIN SOD (PORK) LOCK FLUSH 100 UNIT/ML IV SOLN
500.0000 [IU] | Freq: Once | INTRAVENOUS | Status: AC
  Administered 2019-01-28: 15:00:00 500 [IU] via INTRAVENOUS

## 2019-01-28 NOTE — Telephone Encounter (Signed)
Patient accepts 130 PM appointment today.

## 2019-01-28 NOTE — Telephone Encounter (Signed)
Patient called reporting that she went to ER Sunday and that her heart rate was over 200. She got IV fluids and was sent home. She is now complaining of shortness of breath and racing heart with any exertion as well as weakness. She thins her blood may be low. Please advise

## 2019-01-28 NOTE — Telephone Encounter (Signed)
Per Dr. Jacinto Reap - Please add patient to schedule lab/possible IV fluids today. (cbc, metb).  See if infusion can accommodate. If not, I can give her the IV fluids.  Note: per Dr. Jacinto Reap- no hold tube as Patient's hgb was 11.0 - at the ER 2 days ago.

## 2019-01-28 NOTE — Addendum Note (Signed)
Addended by: Renita Papa R on: 01/28/2019 10:00 AM   Modules accepted: Orders

## 2019-01-29 ENCOUNTER — Other Ambulatory Visit: Payer: Medicaid Other

## 2019-01-29 ENCOUNTER — Inpatient Hospital Stay (HOSPITAL_BASED_OUTPATIENT_CLINIC_OR_DEPARTMENT_OTHER): Payer: Medicaid Other | Admitting: Internal Medicine

## 2019-01-29 ENCOUNTER — Inpatient Hospital Stay: Payer: Medicaid Other

## 2019-01-29 ENCOUNTER — Encounter: Payer: Self-pay | Admitting: Internal Medicine

## 2019-01-29 ENCOUNTER — Other Ambulatory Visit: Payer: Self-pay

## 2019-01-29 ENCOUNTER — Ambulatory Visit: Payer: Medicaid Other | Admitting: Internal Medicine

## 2019-01-29 DIAGNOSIS — C538 Malignant neoplasm of overlapping sites of cervix uteri: Secondary | ICD-10-CM

## 2019-01-29 DIAGNOSIS — Z7189 Other specified counseling: Secondary | ICD-10-CM

## 2019-01-29 DIAGNOSIS — Z5111 Encounter for antineoplastic chemotherapy: Secondary | ICD-10-CM | POA: Diagnosis not present

## 2019-01-29 MED ORDER — DIPHENHYDRAMINE HCL 50 MG/ML IJ SOLN
50.0000 mg | Freq: Once | INTRAMUSCULAR | Status: AC
  Administered 2019-01-29: 12:00:00 50 mg via INTRAVENOUS
  Filled 2019-01-29: qty 1

## 2019-01-29 MED ORDER — FAMOTIDINE IN NACL 20-0.9 MG/50ML-% IV SOLN
20.0000 mg | Freq: Once | INTRAVENOUS | Status: AC
  Administered 2019-01-29: 20 mg via INTRAVENOUS
  Filled 2019-01-29: qty 50

## 2019-01-29 MED ORDER — SODIUM CHLORIDE 0.9 % IV SOLN
Freq: Once | INTRAVENOUS | Status: AC
  Administered 2019-01-29: 10:00:00 via INTRAVENOUS
  Filled 2019-01-29: qty 250

## 2019-01-29 MED ORDER — SODIUM CHLORIDE 0.9 % IV SOLN
80.0000 mg/m2 | Freq: Once | INTRAVENOUS | Status: AC
  Administered 2019-01-29: 156 mg via INTRAVENOUS
  Filled 2019-01-29: qty 26

## 2019-01-29 MED ORDER — HEPARIN SOD (PORK) LOCK FLUSH 100 UNIT/ML IV SOLN
500.0000 [IU] | Freq: Once | INTRAVENOUS | Status: AC | PRN
  Administered 2019-01-29: 14:00:00 500 [IU]
  Filled 2019-01-29: qty 5

## 2019-01-29 MED ORDER — DEXAMETHASONE SODIUM PHOSPHATE 10 MG/ML IJ SOLN
10.0000 mg | Freq: Once | INTRAMUSCULAR | Status: AC
  Administered 2019-01-29: 12:00:00 10 mg via INTRAVENOUS
  Filled 2019-01-29: qty 1

## 2019-01-29 MED ORDER — SODIUM CHLORIDE 0.9% FLUSH
10.0000 mL | INTRAVENOUS | Status: DC | PRN
  Filled 2019-01-29: qty 10

## 2019-01-29 MED ORDER — MAGNESIUM SULFATE 4 GM/100ML IV SOLN
4.0000 g | Freq: Once | INTRAVENOUS | Status: AC
  Administered 2019-01-29: 4 g via INTRAVENOUS
  Filled 2019-01-29: qty 100

## 2019-01-29 NOTE — Assessment & Plan Note (Addendum)
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  CT abdomen pelvis-April 2020 -notice of progressive disease in the abdomen pelvis stable bowel thickening; lung nodule stable currently status post carbotaxol cycle #7.  #Given the poor tolerance to ongoing chemotherapy-recommend discontinuation of carboplatin.  Proceed with Taxol alone.  #Again reviewed the potential side effects of Taxol including but not limited to neuropathy constipation/bone marrow suppression.  # UTI- Klebsiella-status post treatment stable.  #Hypomagnesemia-secondary platinum chemotherapy-mag 1.5-  proceed with 4 g magnesium today.stable  #Chronic right lower quadrant abdominal pain/scar tissue versus others-clinically stable continue oxycodone.  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  # adrenal insuffiency- on maintenance steroids-stable  # DISPOSITION: # Taxol/ Mag 4 gm/ 2h today. # 1 week-cbc/bmp/mag Taxol/ Mag 4 gm/2h possible IVFs over 1 hour # follow up in 2 week-MD cbc/cmpmag/ taxol;Mag infusion 2 hours-Dr.B

## 2019-01-29 NOTE — Progress Notes (Signed)
[place] NOTE  Patient Care Team: Lada, Satira Anis, MD as PCP - General (Family Medicine) Mellody Drown, MD as Consulting Physician (Obstetrics and Gynecology) Lenor Coffin, MD as Attending Physician (Gynecology) Lequita Asal, MD as Medical Oncologist (Hematology and Oncology) [name], MD as Consulting Physician (Gastroenterology) Michael Boston, MD as Consulting Physician (General Surgery) Lovell Sheehan, MD as Consulting Physician (Orthopedic Surgery) Clent Jacks, RN as Registered Nurse  CHIEF COMPLAINTS/PURPOSE OF CONSULTATION: Cervical cancer  #  Oncology History   # dec 2017- CERVICAL ADENO CA [[place]]; Stage IB [Dr. Christene Slates at [facility] in [place], [place];No surgery- Chemo-RT;  # 2018-sep - lung nodules [in [place]]  # AUG 20th, 2019-#1 Carbo-Taxol s/p 6 cycles- [No avastin sec to blood clots]: Feb 2020-bilateral lung nodules with 1 cm in size. March 2020- finished carbo-taxol #7; poor tolerance to [place].  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  She received treatment from 11/2016-05/2017.  01/2017 cisplatin x 2 and carboplatin x 1 (01/29/2017) due to ARF and XRT.  XRT was followed by T & O on 02/01/2017 and T & N 02/10/2017 and 02/20/2017.  Course was complicated by 80 pound weight loss, nausea, vomiting, electrolyte wasting (potassium and magnesium).  She describes that.  Is been sick constantly requiring at least 20 hospitalizations.  Follow-up CT chest and PET on 06/2017. Per patient, 'radiation worked' and no disease in the  abdomen.  At that time she was noted to have lung nodules that were growing and follow-up imaging was scheduled for 10/2017.  She was admitted to hospital in [place] for small bowel obstruction which was managed conservatively and she was home for a week prior to traveling to [place] for Thanksgiving holiday where she has family.  She presented to ER in [place] on 08/2017 with nausea, vomiting, and lower abdominal pain.  Symptoms did not respond to conservative treatment.  CT on 08/26/2017 revealed small bowel obstruction with transition in the pelvis just superior to the uterus rather was a long segment of distal ileum with fatty wall thickening compatible with chronic inflammation and/or radiation enteritis. Imaging showed numerous pulmonary nodules consistent with metastatic disease. She underwent laparotomy and right ileocolectomy on 08/31/2017 at [facility].  Surgical findings revealed a thickened, matted, and scarred piece of distal small bowel close to the ileocecal valve.  She was discharged on 09/05/2017.  Pain markedly increased in intensity and imaging was performed on 09/11/2017 which revealed: Debris within the anterior abdominal wall incision concerning for infection versus packing material, s/p post ileo-colectomy with expected postoperative changes, mild colonic ileus, numerous pulmonary nodules highly concerning for metastatic disease, punctate nonobstructing nephrolithiasis.  Staples were removed and one was packed.  She was started on doxycycline.  Abdominal and pelvic CT without contrast on 09/11/2017 revealed debris within anterior abdominal wall incision concerning for infection, versus packing material.She is s/pileocolectomy with expected postoperative changes and mild colonic ileus. There were numerous pulmonary nodules highly concerning for metastatic diseaseand punctate nonobstructing nephrolithiasis. She was readmitted on 09/12/2017. She describes the onset  of lower abdominal pain on 09/09/2017. Pain markedly increased in intensity on 09/11/2017.  Staples were removed and the wound packed. She was started on doxycycline.  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  PET scan was ordered to evaluate enlarging lung nodules with concern for recurrent cervical cancer but scan was delayed due to insurance and need to be performed in [place].  Presented to ER on 12/03/2017 for abdominal pain and emesis.  Imaging concerning for worsening possible uterine infarct and she was admitted to hospital.  Pelvic MRI was unremarkable.  Remote scarring type changes of uterus thought to be possibly related to radiation.  She was discharged on 12/08/2017.  Underwent endoscopy and colonoscopy on 12/20/2017.    On 02/22/2018 she underwent laser ablation of condylomata around the anus and vagina under anesthesia with Dr. Johney Maine.   04/15/2018: Chest, abdomen, and pelvis CTrevealed innumerable (>100) cavitary nodules scattered in the lungs, moderately enlarging compared to the 11/08/2017 PET-CT, suspicious for metastatic disease. One index node in the RLL measures 1.0 x 1.1 cm (previously 0.6 x 0.6 cm). There were no new nodules. There was an ill-defined wall thickening in the rectosigmoid with surrounding stranding along fascia planes, probably sequela from prior radiation therapy. There was multilevel lumbar impingement due to spondylosis and degenerative disc disease. There was heterogeneous enhancement in the uterus (some possibly from prior radiation therapy).  04/23/2018:PET scan  revealednumerous scattered solid and cavitary nodules in the lungs stable increased in size compared to the prior PET-CT from 11/08/2017.Largest nodule was 1.1 cm in the LUL (SUV 1.9).These demonstratedlow-grade metabolic activity up to a maximum SUV of about 2.3, increased from 11/08/2017.   Case was discussed at tumor board on 04/25/2018. Consensus to pursue CT-guided biopsy (05/06/18) which revealed: Metastatic adenocarcinoma, morphologically consistent with cervical adenocarcinoma.  She has history of chronic hepatitis C which is managed by GI.  Hepatitis C genotype is 2a/2c.  She receives B12 injections for history of B12 deficiency.  On 04/24/2018 she underwent left total knee replacement with Dr. [name]. --------------------------------------------------------   DIAGNOSIS: Cervical adenocarcinoma  STAGE: IV        ;GOALS: Palliative  CURRENT/MOST RECENT THERAPY : Carbotaxol      Malignant neoplasm of overlapping sites of cervix ([place])   01/29/2019 -  Chemotherapy    The patient had PACLitaxel (TAXOL) 156 mg in sodium chloride 0.9 % 250 mL chemo infusion (</= 58m/m2), 80 mg/m2 = 156 mg, Intravenous,  Once, 1 of 4 cycles  for chemotherapy treatment.       HISTORY OF PRESENTING ILLNESS:  Joanna ARMAND89y.o.  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  Most recent chemotherapy was approximately 3 weeks ago.  Patient was last evaluated in the symptomatic med for lower abdominal pain.  CT scan showed no acute etiology.  Chronic bowel thickening from a previous radiation.  Patient was again recently evaluated in the emergency room for fluids.  Left lower extremity ultrasound was also negative.  Today patient feels improved.  No nausea no vomiting.  Pain is improved.  Review of Systems  Constitutional: Positive for malaise/fatigue. Negative for chills, diaphoresis, fever and weight loss.  HENT: Negative for nosebleeds and sore throat.   Eyes: Negative for double vision.  Respiratory: Negative for cough, hemoptysis, sputum production, shortness of breath and wheezing.   Cardiovascular: Negative for chest pain, palpitations, orthopnea and leg swelling.  Gastrointestinal: Positive for abdominal pain. Negative for blood in stool, constipation, diarrhea, heartburn, melena, nausea and vomiting.  Genitourinary: Negative for dysuria, frequency and urgency.  Musculoskeletal: Negative for back pain and joint pain.  Skin: Negative.  Negative for itching and rash.  Neurological: Negative for dizziness, tingling, focal weakness, weakness and headaches.  Endo/Heme/Allergies: Does not bruise/bleed easily.  Psychiatric/Behavioral: Negative for depression. The patient is not nervous/anxious and does not have insomnia.      MEDICAL HISTORY:  Past Medical History:  Diagnosis Date  . Abdominal pain 06/10/2018  . Abnormal cervical Papanicolaou smear 09/18/2017  . Anxiety   . Aortic atherosclerosis ([place])   . Arthritis    neck and knees  . 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 Blood transfusion without reported diagnosis   . Cervical cancer ([place]) 09/2016   mets lung  . Chronic anal fissure   . Chronic diarrhea   . Dyspnea   . Erosive gastropathy 09/18/2017  . Factor V Leiden mutation ([place])   . Fecal incontinence   . Genital warts   . GERD (gastroesophageal reflux disease)   . Heart murmur   . Hematochezia   . Hemorrhoids   . 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 Hepatitis, chronic ([place]) 05/05/2017  . History of cancer chemotherapy    completed 06/2017  . History of Clostridium difficile infection    while undergoing chemo.  Negative test 10/2017  . Ileocolic anastomotic leak   . 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 Intestinal infection due to Clostridium difficile 09/18/2017  . Macrocytic anemia with vitamin B12  deficiency   . Multiple gastric ulcers   . Nausea vomiting and diarrhea   . Pancolitis ([place]) 07/27/2018  . Perianal condylomata   . Pneumonia    History of  . Pulmonary nodules   . Rectal bleeding   . Small bowel obstruction ([place]) 08/2017  . Stiff neck    limited right turn  . Vitamin D deficiency     SURGICAL HISTORY: Past Surgical History:  Procedure Laterality Date  . CHOLECYSTECTOMY    . COLON SURGERY  08/2017   resection  . COLONOSCOPY WITH PROPOFOL N/A 12/20/2017   Procedure: COLONOSCOPY WITH PROPOFOL;  Surgeon: [name], MD;  Location: [facility] ENDOSCOPY;  Service: Gastroenterology;  Laterality: N/A;  . COLONOSCOPY WITH PROPOFOL N/A 07/30/2018   Procedure: COLONOSCOPY WITH PROPOFOL;  Surgeon: [name], MD;  Location: [facility] ENDOSCOPY;  Service: Gastroenterology;  Laterality: N/A;  . COLONOSCOPY WITH PROPOFOL N/A 10/10/2018   Procedure: COLONOSCOPY WITH PROPOFOL;  Surgeon: Lucilla Lame, MD;  Location: [facility] ENDOSCOPY;  Service: Endoscopy;  Laterality: N/A;  . DIAGNOSTIC LAPAROSCOPY    . ESOPHAGOGASTRODUODENOSCOPY (EGD) WITH PROPOFOL N/A 12/20/2017   Procedure: ESOPHAGOGASTRODUODENOSCOPY (EGD) WITH PROPOFOL;  Surgeon: [name], MD;  Location: [facility];  Service: Gastroenterology;  Laterality: N/A;  . ESOPHAGOGASTRODUODENOSCOPY (EGD) WITH PROPOFOL  07/30/2018   Procedure: ESOPHAGOGASTRODUODENOSCOPY (EGD) WITH PROPOFOL;  Surgeon: [name], MD;  Location: [facility] ENDOSCOPY;  Service: Gastroenterology;;  . Otho Darner SIGMOIDOSCOPY N/A 11/21/2018   Procedure: FLEXIBLE SIGMOIDOSCOPY;  Surgeon: [name], MD;  Location: [facility] ENDOSCOPY;  Service: Gastroenterology;  Laterality: N/A;  . LAPAROTOMY N/A 08/31/2017   Procedure: EXPLORATORY LAPAROTOMY for SBO, ileocolectomy, removal of piece of uterine wall;  Surgeon: [name], MD;  Location: [facility] ORS;  Service: General;  Laterality: N/A;  . LASER ABLATION CONDOLAMATA N/A 02/22/2018    Procedure: LASER ABLATION/REMOVAL OF GHWEXHBZJIR AROUND ANUS AND VAGINA;  Surgeon: Michael Boston, MD;  Location: [facility];  Service: General;  Laterality: N/A;  . OOPHORECTOMY    . PORTA CATH INSERTION N/A 05/13/2018   Procedure: PORTA CATH INSERTION;  Surgeon: Katha Cabal, MD;  Location: [facility];  Service: Cardiovascular;  Laterality: N/A;  . SMALL INTESTINE SURGERY    . TANDEM RING INSERTION     x3  . THORACOTOMY    . TOTAL KNEE ARTHROPLASTY Left 04/24/2018   Procedure: TOTAL KNEE ARTHROPLASTY;  Surgeon: Lovell Sheehan, MD;  Location: [facility] ORS;  Service: Orthopedics;  Laterality: Left;    SOCIAL HISTORY: Social History   Socioeconomic History  . Marital status: Divorced    Spouse name: Not on file  . Number of children: Not on file  . Years of education: Not on file  . Highest education level: Not on file  Occupational History  . Not on file  Social Needs  . Financial resource strain: Not hard at all  . Food insecurity:    Worry: Never true    Inability: Never true  . Transportation needs:    Medical: No    Non-medical: No  Tobacco Use  . Smoking status: Former Smoker    Last attempt to quit: 10/16/2006    Years since quitting: 12.2  . Smokeless tobacco: Never Used  Substance and Sexual Activity  . Alcohol use: Not Currently    Frequency: Never    Comment: seldom  . Drug use: Yes    Types: Marijuana  . Sexual activity: Not Currently    Birth control/protection: Post-menopausal    Comment: Not Asked  Lifestyle  . Physical activity:    Days per week: Patient refused    Minutes per session: Patient refused  . Stress: Only a little  Relationships  . Social connections:    Talks on phone: Patient refused    Gets together: Patient refused    Attends religious service: Patient refused    Active member of club or organization: Patient refused    Attends meetings of clubs or organizations: Patient refused    Relationship status:  Patient refused  . Intimate partner violence:    Fear of current or ex partner: No    Emotionally abused: No    Physically abused: No    Forced sexual activity: No  Other Topics Concern  . Not on file  Social History Narrative  . Not on file    FAMILY HISTORY: Family History  Problem Relation Age of Onset  . Hypertension Father   . Diabetes Father   . Alcohol abuse Daughter   . Hypertension Maternal Grandmother   . Diabetes Maternal Grandmother   . Diabetes Paternal Grandmother   . Hypertension Paternal Grandmother     ALLERGIES:  is allergic to ketamine.  MEDICATIONS:  Current Outpatient Medications  Medication Sig Dispense Refill  . amitriptyline (ELAVIL) 75 MG tablet Take 1 tablet (75 mg total) by mouth at bedtime. 90 tablet 1  . budesonide (ENTOCORT EC) 3 MG 24 hr capsule Take 3 capsules (9 mg total) by mouth daily. 30 capsule 0  . Calcium Carb-Cholecalciferol (CALCIUM 500 +D)  500-400 MG-UNIT TABS Take 2 tablets by mouth daily.    . famotidine (PEPCID) 20 MG tablet Take 1 tablet (20 mg total) by mouth 2 (two) times daily as needed for heartburn or indigestion.    . hydrocortisone (CORTEF) 10 MG tablet Take 1 tablet (10 mg total) by mouth 2 (two) times daily. 180 tablet 0  . hyoscyamine (LEVBID) 0.375 MG 12 hr tablet Take 1 tablet (0.375 mg total)by mouth 2 (two) times daily. 60 tablet 0  . meclizine (ANTIVERT) 25 MG tablet Take 1 tablet (25 mg total) by mouth 2 (two) times daily as needed for dizziness. 15 tablet 0  . Multiple Vitamins-Minerals (MULTIVITAMIN WITH MINERALS) tablet Take 1 tablet by mouth daily. 30 tablet 0  . oxyCODONE (OXYCONTIN) 15 mg 12 hr tablet Take 1 tablet (15 mg total) by mouth every 12 (twelve) hours. 30 tablet 0  . Oxycodone HCl 10 MG TABS Take 1 tablet (10 mg total) by mouth every 6 (six) hours as needed. 60 tablet 0  . pantoprazole (PROTONIX) 40 MG tablet Take 1 tablet (40 mg total) by mouth daily. 90 tablet 0  . potassium chloride SA  (K-DUR,KLOR-CON) 20 MEQ tablet Take 1 tablet (20 mEq total) by mouth daily. 30 tablet 3  . promethazine (PHENERGAN) 25 MG tablet Take 1 tablet (25 mg total) by mouth every 8 (eight) hours as needed for nausea or vomiting. 30 tablet 3  . rivaroxaban (XARELTO) 20 MG TABS tablet Take 20 mg by mouth daily with supper.    . sulfamethoxazole-trimethoprim (BACTRIM DS,SEPTRA DS) 800-160 MG tablet Take 1 tablet by mouth 2 (two) times daily. 14 tablet 0  . VENTOLIN HFA 108 (90 Base) MCG/ACT inhaler Inhale 1-2 puffs into the lungs every 4 (four) hours as needed for shortness of breath.   0  . VIBERZI 100 MG TABS TAKE 1 TABLET BY MOUTH TWICE DAILY WITH  A  MEAL 60 tablet 0  . diphenoxylate-atropine (LOMOTIL) 2.5-0.025 MG tablet Take 2 tablets by mouth 4 (four) times daily. (Patient not taking: Reported on 01/29/2019) 30 tablet 0  . ondansetron (ZOFRAN ODT) 4 MG disintegrating tablet Take 1 tablet (4 mg total) by mouth every 8 (eight) hours as needed for nausea or vomiting. (Patient not taking: Reported on 01/29/2019) 20 tablet 0   No current facility-administered medications for this visit.    Facility-Administered Medications Ordered in Other Visits  Medication Dose Route Frequency Provider Last Rate Last Dose  . dexamethasone (DECADRON) injection 10 mg  10 mg Intravenous Once Charlaine Dalton R, MD      . diphenhydrAMINE (BENADRYL) injection 50 mg  50 mg Intravenous Once Charlaine Dalton R, MD      . famotidine (PEPCID) IVPB 20 mg premix  20 mg Intravenous Once Charlaine Dalton R, MD      . heparin lock flush 100 unit/mL  500 Units Intravenous Once Corcoran, Melissa C, MD      . heparin lock flush 100 unit/mL  500 Units Intracatheter Once PRN Charlaine Dalton R, MD      . magnesium sulfate IVPB 4 g 100 mL  4 g Intravenous Once Cammie Sickle, MD 50 mL/hr at 01/29/19 0942 4 g at 01/29/19 0942  . PACLitaxel (TAXOL) 156 mg in sodium chloride 0.9 % 250 mL chemo infusion (</= 14m/m2)  80 mg/m2  (Treatment Plan Recorded) Intravenous Once BCharlaine DaltonR, MD      . sodium chloride 0.9 % 1,000 mL with potassium chloride 20 mEq infusion  Intravenous Once Honor Loh E, NP      . sodium chloride flush (NS) 0.9 % injection 10 mL  10 mL Intracatheter PRN Cammie Sickle, MD          .  PHYSICAL EXAMINATION: ECOG PERFORMANCE STATUS: 1 - Symptomatic but completely ambulatory  Vitals:   01/29/19 0843  BP: 108/72  Pulse: 96  Resp: 20  Temp: (!) 96.6 F (35.9 C)   Filed Weights   01/29/19 0843  Weight: 179 lb (81.2 kg)    Physical Exam  Constitutional: She is oriented to person, place, and time and well-developed, well-nourished, and in no distress.  Alone.  Walking herself.  Complains of pain in the bilateral lower pelvic region.  HENT:  Head: Normocephalic and atraumatic.  Mouth/Throat: Oropharynx is clear and moist. No oropharyngeal exudate.  Eyes: Pupils are equal, round, and reactive to light.  Neck: Normal range of motion. Neck supple.  Cardiovascular: Normal rate and regular rhythm.  Pulmonary/Chest: Effort normal and breath sounds normal. No respiratory distress. She has no wheezes.  Abdominal: Soft. Bowel sounds are normal. She exhibits no distension and no mass. There is no rebound and no guarding.  Musculoskeletal: Normal range of motion.        General: No tenderness or edema.  Neurological: She is alert and oriented to person, place, and time.  Skin: Skin is warm.  Psychiatric: Affect normal.     LABORATORY DATA:  I have reviewed the data as listed Lab Results  Component Value Date   WBC 4.2 01/28/2019   HGB 10.3 (L) 01/28/2019   HCT 30.3 (L) 01/28/2019   MCV 103.4 (H) 01/28/2019   PLT 114 (L) 01/28/2019   Recent Labs    06/11/18 0510  07/08/18 1035  10/21/18 0845  01/20/19 1340 01/22/19 1004 01/26/19 1554 01/28/19 1337  NA 135   < > 136   < > 134*   < > 136 137 135 133*  K 3.9   < > 4.2   < > 3.9   < > 3.9 3.9 4.1 4.1  CL 102    < > 107   < > 98   < > 105 105 101 103  CO2 27   < > 22   < > 24   < > 25 27 23 22   GLUCOSE 82   < > 119*   < > 111*   < > 98 89 102* 103*  BUN 11   < > 15   < > 17   < > 7 13 11 15   CREATININE 0.63   < > 0.71   < > 0.91   < > 0.58 0.67 0.90 1.26*  CALCIUM 8.9   < > 8.7*   < > 9.6   < > 9.1 8.7* 9.9 9.2  GFRNONAA >60   < > >60   < > >60   < > >60 >60 >60 49*  GFRAA >60   < > >60   < > >60   < > >60 >60 >60 57*  PROT 7.0   < > 6.5   < > 8.6*   < > 7.3 6.7 8.4*  --   ALBUMIN 3.5   < > 3.4*   < > 4.4   < > 3.5 3.4* 4.0  --   AST 41   < > 26   < > 48*   < > 186* 33 41  --   ALT 51*   < >  33   < > 67*   < > 86* 40 69*  --   ALKPHOS 171*   < > 132*   < > 185*   < > 243* 183* 290*  --   BILITOT 0.7   < > 0.4   < > 1.4*   < > 0.5 0.4 0.7  --   BILIDIR 0.1  --  <0.1  --  0.3*  --   --   --   --   --   IBILI 0.6  --  NOT CALCULATED  --   --   --   --   --   --   --    < > = values in this interval not displayed.    RADIOGRAPHIC STUDIES: I have personally reviewed the radiological images as listed and agreed with the findings in the report. Ct Head Wo Contrast  Result Date: 01/26/2019 CLINICAL DATA:  Altered level of consciousness. EXAM: CT HEAD WITHOUT CONTRAST TECHNIQUE: Contiguous axial images were obtained from the base of the skull through the vertex without intravenous contrast. COMPARISON:  None. FINDINGS: Brain: No evidence of acute infarction, hemorrhage, hydrocephalus, extra-axial collection or mass lesion/mass effect. Vascular: No hyperdense vessel or unexpected calcification. Skull: Normal. Negative for fracture or focal lesion. Sinuses/Orbits: No acute finding. Other: None. IMPRESSION: Normal head CT. Electronically Signed   By: Marijo Conception, M.D.   On: 01/26/2019 22:19   Ct Abdomen Pelvis W Contrast  Result Date: 01/23/2019 CLINICAL DATA:  Severe pelvic pain with nausea.  Cervical cancer. EXAM: CT ABDOMEN AND PELVIS WITH CONTRAST TECHNIQUE: Multidetector CT imaging of the abdomen and  pelvis was performed using the standard protocol following bolus administration of intravenous contrast. CONTRAST:  68m OMNIPAQUE IOHEXOL 300 MG/ML  SOLN COMPARISON:  12/16/2018 FINDINGS: Lower chest: Multiple pulmonary metastases again noted without substantial interval change. 8 mm index lesion posterior left lower lobe (11/3) is stable since prior. Hepatobiliary: No suspicious focal abnormality within the liver parenchyma. Trace prominence intrahepatic bile ducts with extrahepatic common duct measuring 14 mm diameter, stable since prior study. Gallbladder surgically absent. Pancreas: No focal mass lesion. No dilatation of the main duct. No intraparenchymal cyst. No peripancreatic edema. Spleen: No splenomegaly. No focal mass lesion. Adrenals/Urinary Tract: No adrenal nodule or mass. 1 mm nonobstructing stone identified lower pole right kidney (34/2). Left kidney unremarkable. No evidence for hydroureter. The urinary bladder appears normal for the degree of distention. Stomach/Bowel: Stomach is unremarkable. No gastric wall thickening. No evidence of outlet obstruction. Duodenum is normally positioned as is the ligament of Treitz. No small bowel wall thickening. No small bowel dilatation. Status post right hemicolectomy. Wall thickening in the sigmoid colon and rectum is similar to prior. Vascular/Lymphatic: There is abdominal aortic atherosclerosis without aneurysm. 12 mm short axis hepato duodenal ligament lymph node (19/2) is stable. Other upper normal to borderline enlarged hepato duodenal ligament lymph nodes are similar. No para-aortic lymphadenopathy by size criteria. No pelvic sidewall lymphadenopathy. Reproductive: The uterus is unremarkable.  There is no adnexal mass. Other: No intraperitoneal free fluid. Musculoskeletal: Presacral and perirectal edema is similar to prior. Similar appearance diffusely heterogeneous bone mineralization. Lumbar scoliosis with advanced degenerative disc disease. Tiny gas  locules to the right of the L4-5 interspace are compatible with degenerative change. IMPRESSION: 1. No substantial interval change in exam. No findings to explain the patient's history of pelvic pain. 2. Similar appearance of pulmonary metastases identified in the lung bases. 3. Stable extrahepatic biliary duct  dilatation in this patient status post cholecystectomy. 4. Wall thickening in the sigmoid colon and rectum is similar to prior. Perirectal and presacral edema also not substantially changed. Features likely reflect sequelae of radiation therapy. Electronically Signed   By: Misty Stanley M.D.   On: 01/23/2019 14:01   US Venous Img Lower Unilateral Left  Result Date: 01/22/2019 CLINICAL DATA:  Left leg swelling for 1 week EXAM: LEFT LOWER EXTREMITY VENOUS DOPPLER ULTRASOUND TECHNIQUE: Gray-scale sonography with graded compression, as well as color Doppler and duplex ultrasound were performed to evaluate the lower extremity deep venous systems from the level of the common femoral vein and including the common femoral, femoral, profunda femoral, popliteal and calf veins including the posterior tibial, peroneal and gastrocnemius veins when visible. The superficial great saphenous vein was also interrogated. Spectral Doppler was utilized to evaluate flow at rest and with distal augmentation maneuvers in the common femoral, femoral and popliteal veins. COMPARISON:  None. FINDINGS: Contralateral Common Femoral Vein: Respiratory phasicity is normal and symmetric with the symptomatic side. No evidence of thrombus. Normal compressibility. Common Femoral Vein: No evidence of thrombus. Normal compressibility, respiratory phasicity and response to augmentation. Saphenofemoral Junction: No evidence of thrombus. Normal compressibility and flow on color Doppler imaging. Profunda Femoral Vein: No evidence of thrombus. Normal compressibility and flow on color Doppler imaging. Femoral Vein: No evidence of thrombus. Normal  compressibility, respiratory phasicity and response to augmentation. Popliteal Vein: No evidence of thrombus. Normal compressibility, respiratory phasicity and response to augmentation. Calf Veins: No evidence of thrombus. Normal compressibility and flow on color Doppler imaging. Superficial Great Saphenous Vein: No evidence of thrombus. Normal compressibility. Venous Reflux:  None. Other Findings:  None. IMPRESSION: No evidence of deep venous thrombosis. These results will be called to the ordering clinician or representative by the Radiology Department at the imaging location. Electronically Signed   By: Inez Catalina M.D.   On: 01/22/2019 16:21   Dg Chest Portable 1 View  Result Date: 01/26/2019 CLINICAL DATA:  Current history of metastatic ovarian cancer, presenting with acute cough, generalized weakness and nausea. EXAM: PORTABLE CHEST 1 VIEW COMPARISON:  12/25/2018 and earlier, including CT chest 09/30/2018. FINDINGS: Cardiac silhouette normal in size, unchanged. Thoracic aorta mildly atherosclerotic, unchanged. Hilar and mediastinal contours otherwise unremarkable. The multiple small nodules throughout both lungs identified on the prior CT are inconspicuous on the portable x-ray. No new or suspicious findings in either lung. No pleural effusions. RIGHT jugular Port-A-Cath tip projects over the mid SVC, unchanged. IMPRESSION: 1. No acute cardiopulmonary disease. 2. The multiple small nodules throughout both lungs identified on the prior CT are inconspicuous on the portable x-ray. Electronically Signed   By: Evangeline Dakin M.D.   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  CT abdomen pelvis-April 2020 -notice of progressive disease in the abdomen pelvis stable bowel thickening; lung nodule stable currently status post carbotaxol cycle #7.  #Given the poor tolerance  to ongoing chemotherapy-recommend discontinuation of carboplatin.  Proceed with Taxol alone.  #Again reviewed the potential side effects of Taxol including but not limited to neuropathy constipation/bone marrow suppression.  # UTI- Klebsiella-status post treatment stable.  #Hypomagnesemia-secondary platinum chemotherapy-mag 1.5-  proceed with 4 g magnesium today.stable  #Chronic right lower quadrant abdominal pain/scar tissue versus others-clinically stable continue oxycodone.  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  # adrenal insuffiency- on maintenance steroids-stable  # DISPOSITION: # Taxol/ Mag 4 gm/ 2h today. # 1 week-cbc/bmp/mag Taxol/ Mag 4 gm/2h possible IVFs over 1 hour # follow up in 2 week-MD cbc/cmpmag/ taxol;Mag infusion 2 hours-Dr.B     All questions were answered. The patient knows to call the clinic with any problems, questions or concerns.       Cammie Sickle, MD 01/29/2019 9:44 AM

## 2019-01-29 NOTE — Progress Notes (Signed)
FYI

## 2019-01-30 ENCOUNTER — Ambulatory Visit: Payer: Medicaid Other

## 2019-01-31 ENCOUNTER — Ambulatory Visit: Payer: Medicaid Other

## 2019-02-04 ENCOUNTER — Other Ambulatory Visit: Payer: Self-pay

## 2019-02-05 ENCOUNTER — Other Ambulatory Visit: Payer: Self-pay | Admitting: *Deleted

## 2019-02-05 ENCOUNTER — Other Ambulatory Visit: Payer: Self-pay | Admitting: Internal Medicine

## 2019-02-05 ENCOUNTER — Inpatient Hospital Stay: Payer: Medicaid Other

## 2019-02-05 ENCOUNTER — Ambulatory Visit: Payer: Medicaid Other

## 2019-02-05 ENCOUNTER — Ambulatory Visit: Payer: Medicaid Other | Admitting: Internal Medicine

## 2019-02-05 ENCOUNTER — Other Ambulatory Visit: Payer: Medicaid Other

## 2019-02-05 ENCOUNTER — Other Ambulatory Visit: Payer: Self-pay

## 2019-02-05 ENCOUNTER — Encounter: Payer: Medicaid Other | Admitting: Hospice and Palliative Medicine

## 2019-02-05 VITALS — BP 102/65 | HR 79 | Temp 96.0°F | Resp 19 | Wt 172.8 lb

## 2019-02-05 DIAGNOSIS — C538 Malignant neoplasm of overlapping sites of cervix uteri: Secondary | ICD-10-CM

## 2019-02-05 DIAGNOSIS — Z5111 Encounter for antineoplastic chemotherapy: Secondary | ICD-10-CM | POA: Diagnosis not present

## 2019-02-05 DIAGNOSIS — Z7189 Other specified counseling: Secondary | ICD-10-CM

## 2019-02-05 LAB — CBC WITH DIFFERENTIAL/PLATELET
Abs Immature Granulocytes: 0.02 10*3/uL (ref 0.00–0.07)
Basophils Absolute: 0 10*3/uL (ref 0.0–0.1)
Basophils Relative: 0 %
Eosinophils Absolute: 0.1 10*3/uL (ref 0.0–0.5)
Eosinophils Relative: 2 %
HCT: 24.7 % — ABNORMAL LOW (ref 36.0–46.0)
Hemoglobin: 8.4 g/dL — ABNORMAL LOW (ref 12.0–15.0)
Immature Granulocytes: 1 %
Lymphocytes Relative: 26 %
Lymphs Abs: 0.6 10*3/uL — ABNORMAL LOW (ref 0.7–4.0)
MCH: 35.4 pg — ABNORMAL HIGH (ref 26.0–34.0)
MCHC: 34 g/dL (ref 30.0–36.0)
MCV: 104.2 fL — ABNORMAL HIGH (ref 80.0–100.0)
Monocytes Absolute: 0.2 10*3/uL (ref 0.1–1.0)
Monocytes Relative: 6 %
Neutro Abs: 1.6 10*3/uL — ABNORMAL LOW (ref 1.7–7.7)
Neutrophils Relative %: 65 %
Platelets: 96 10*3/uL — ABNORMAL LOW (ref 150–400)
RBC: 2.37 MIL/uL — ABNORMAL LOW (ref 3.87–5.11)
RDW: 14.3 % (ref 11.5–15.5)
WBC: 2.5 10*3/uL — ABNORMAL LOW (ref 4.0–10.5)
nRBC: 0 % (ref 0.0–0.2)

## 2019-02-05 LAB — COMPREHENSIVE METABOLIC PANEL
ALT: 32 U/L (ref 0–44)
AST: 26 U/L (ref 15–41)
Albumin: 3.2 g/dL — ABNORMAL LOW (ref 3.5–5.0)
Alkaline Phosphatase: 155 U/L — ABNORMAL HIGH (ref 38–126)
Anion gap: 4 — ABNORMAL LOW (ref 5–15)
BUN: 14 mg/dL (ref 6–20)
CO2: 27 mmol/L (ref 22–32)
Calcium: 8.1 mg/dL — ABNORMAL LOW (ref 8.9–10.3)
Chloride: 106 mmol/L (ref 98–111)
Creatinine, Ser: 0.68 mg/dL (ref 0.44–1.00)
GFR calc Af Amer: >60 mL/min (ref >60)
GFR calc non Af Amer: >60 mL/min (ref >60)
Glucose, Bld: 88 mg/dL (ref 70–99)
Potassium: 2.9 mmol/L — ABNORMAL LOW (ref 3.5–5.1)
Sodium: 137 mmol/L (ref 135–145)
Total Bilirubin: 0.2 mg/dL — ABNORMAL LOW (ref 0.3–1.2)
Total Protein: 6.5 g/dL (ref 6.5–8.1)

## 2019-02-05 LAB — MAGNESIUM: Magnesium: 1.2 mg/dL — ABNORMAL LOW (ref 1.7–2.4)

## 2019-02-05 MED ORDER — HEPARIN SOD (PORK) LOCK FLUSH 100 UNIT/ML IV SOLN
500.0000 [IU] | Freq: Once | INTRAVENOUS | Status: AC | PRN
  Administered 2019-02-05: 13:00:00 500 [IU]
  Filled 2019-02-05: qty 5

## 2019-02-05 MED ORDER — POTASSIUM CHLORIDE 20 MEQ/100ML IV SOLN: 20.0000 meq | Freq: Once | INTRAVENOUS | Status: DC

## 2019-02-05 MED ORDER — SODIUM CHLORIDE 0.9 % IV SOLN
Freq: Once | INTRAVENOUS | Status: AC
  Administered 2019-02-05: 09:00:00 via INTRAVENOUS
  Filled 2019-02-05: qty 250

## 2019-02-05 MED ORDER — FAMOTIDINE IN NACL 20-0.9 MG/50ML-% IV SOLN
20.0000 mg | Freq: Once | INTRAVENOUS | Status: AC
  Administered 2019-02-05: 10:00:00 20 mg via INTRAVENOUS
  Filled 2019-02-05: qty 50

## 2019-02-05 MED ORDER — SODIUM CHLORIDE 0.9% FLUSH
10.0000 mL | Freq: Once | INTRAVENOUS | Status: AC
  Administered 2019-02-05: 10 mL via INTRAVENOUS
  Filled 2019-02-05: qty 10

## 2019-02-05 MED ORDER — OXYCODONE HCL ER 15 MG PO T12A: 15.0000 mg | EXTENDED_RELEASE_TABLET | Freq: Two times a day (BID) | ORAL | 0 refills | Status: DC

## 2019-02-05 MED ORDER — DIPHENHYDRAMINE HCL 50 MG/ML IJ SOLN
50.0000 mg | Freq: Once | INTRAMUSCULAR | Status: AC
  Administered 2019-02-05: 10:00:00 50 mg via INTRAVENOUS
  Filled 2019-02-05: qty 1

## 2019-02-05 MED ORDER — SODIUM CHLORIDE 0.9 % IV SOLN
Freq: Once | INTRAVENOUS | Status: AC
  Administered 2019-02-05: 11:00:00 via INTRAVENOUS
  Filled 2019-02-05: qty 100

## 2019-02-05 MED ORDER — MAGNESIUM SULFATE 4 GM/100ML IV SOLN: 4.0000 g | Freq: Once | INTRAVENOUS | Status: DC

## 2019-02-05 MED ORDER — SODIUM CHLORIDE 0.9 % IV SOLN
80.0000 mg/m2 | Freq: Once | INTRAVENOUS | Status: AC
  Administered 2019-02-05: 11:00:00 156 mg via INTRAVENOUS
  Filled 2019-02-05: qty 26

## 2019-02-05 MED ORDER — DEXAMETHASONE SODIUM PHOSPHATE 10 MG/ML IJ SOLN
10.0000 mg | Freq: Once | INTRAMUSCULAR | Status: AC
  Administered 2019-02-05: 10:00:00 10 mg via INTRAVENOUS
  Filled 2019-02-05: qty 1

## 2019-02-05 NOTE — Progress Notes (Signed)
PLT 96, ok to proceed per md

## 2019-02-05 NOTE — Telephone Encounter (Signed)
Patient called cancer center requesting refill of OxyContin 15 mg q 12 hours.   As mandated by the [place] STOP Act (Strengthen Opioid Misuse Prevention), the [place] Controlled Substance Reporting System (Point) was reviewed for this patient.  Below is the past 50-month of controlled substance prescriptions as displayed by the registry.  I have personally consulted with my supervising physician, Dr. BRogue Bussing who agrees that continuation of opiate therapy is medically appropriate at this time and agrees to provide continual monitoring, including urine/blood drug screens, as indicated. Refill is appropriate on or after 02/03/19.  NCCSRS reviewed:     JFaythe Casa NP 02/05/2019 11:32 AM [phone]

## 2019-02-06 ENCOUNTER — Ambulatory Visit: Payer: Medicaid Other

## 2019-02-07 ENCOUNTER — Other Ambulatory Visit: Payer: Self-pay | Admitting: Nurse Practitioner

## 2019-02-07 ENCOUNTER — Other Ambulatory Visit: Payer: Self-pay | Admitting: Gastroenterology

## 2019-02-07 ENCOUNTER — Encounter: Payer: Self-pay | Admitting: Oncology

## 2019-02-07 ENCOUNTER — Other Ambulatory Visit: Payer: Self-pay | Admitting: Oncology

## 2019-02-10 ENCOUNTER — Telehealth: Payer: Self-pay | Admitting: *Deleted

## 2019-02-10 ENCOUNTER — Encounter: Payer: Self-pay | Admitting: Gastroenterology

## 2019-02-10 ENCOUNTER — Other Ambulatory Visit: Payer: Self-pay

## 2019-02-10 ENCOUNTER — Inpatient Hospital Stay: Payer: Medicaid Other

## 2019-02-10 ENCOUNTER — Inpatient Hospital Stay (HOSPITAL_BASED_OUTPATIENT_CLINIC_OR_DEPARTMENT_OTHER): Payer: Medicaid Other | Admitting: Oncology

## 2019-02-10 VITALS — BP 117/75 | HR 80 | Temp 97.0°F | Resp 20

## 2019-02-10 DIAGNOSIS — R0789 Other chest pain: Secondary | ICD-10-CM

## 2019-02-10 DIAGNOSIS — E860 Dehydration: Secondary | ICD-10-CM

## 2019-02-10 DIAGNOSIS — C538 Malignant neoplasm of overlapping sites of cervix uteri: Secondary | ICD-10-CM

## 2019-02-10 DIAGNOSIS — Z5111 Encounter for antineoplastic chemotherapy: Secondary | ICD-10-CM | POA: Diagnosis not present

## 2019-02-10 LAB — COMPREHENSIVE METABOLIC PANEL
ALT: 58 U/L — ABNORMAL HIGH (ref 0–44)
AST: 31 U/L (ref 15–41)
Albumin: 3.3 g/dL — ABNORMAL LOW (ref 3.5–5.0)
Alkaline Phosphatase: 145 U/L — ABNORMAL HIGH (ref 38–126)
Anion gap: 7 (ref 5–15)
BUN: 15 mg/dL (ref 6–20)
CO2: 27 mmol/L (ref 22–32)
Calcium: 8.5 mg/dL — ABNORMAL LOW (ref 8.9–10.3)
Chloride: 105 mmol/L (ref 98–111)
Creatinine, Ser: 0.72 mg/dL (ref 0.44–1.00)
GFR calc Af Amer: >60 mL/min (ref >60)
GFR calc non Af Amer: >60 mL/min (ref >60)
Glucose, Bld: 97 mg/dL (ref 70–99)
Potassium: 4.1 mmol/L (ref 3.5–5.1)
Sodium: 139 mmol/L (ref 135–145)
Total Bilirubin: 0.3 mg/dL (ref 0.3–1.2)
Total Protein: 6.8 g/dL (ref 6.5–8.1)

## 2019-02-10 LAB — TROPONIN I: Troponin I: <0.03 ng/mL (ref <0.03)

## 2019-02-10 LAB — CK: Total CK: 24 U/L — ABNORMAL LOW (ref 38–234)

## 2019-02-10 LAB — MAGNESIUM: Magnesium: 1.4 mg/dL — ABNORMAL LOW (ref 1.7–2.4)

## 2019-02-10 MED ORDER — SODIUM CHLORIDE 0.9 % IV SOLN
INTRAVENOUS | Status: DC
  Administered 2019-02-10: 14:00:00 via INTRAVENOUS
  Filled 2019-02-10 (×2): qty 250

## 2019-02-10 MED ORDER — HEPARIN SOD (PORK) LOCK FLUSH 100 UNIT/ML IV SOLN
500.0000 [IU] | Freq: Once | INTRAVENOUS | Status: AC
  Administered 2019-02-10: 15:00:00 500 [IU] via INTRAVENOUS
  Filled 2019-02-10: qty 5

## 2019-02-10 NOTE — Telephone Encounter (Signed)
Yes I can . Doing now!  Faythe Casa, NP 02/10/2019 11:19

## 2019-02-10 NOTE — Progress Notes (Signed)
MD and NP made aware that pt is having intermit chest pain and feels SOB more frequently the last few days. Per pt she describes the pain as "sharp and around her heart." VSS, pt is A&O X4, no distress noted. Orders placed by NP, labs were drawn, EKG completed and handed to NP. NP at chairside to see pt. Pt has no further complaints at this time. RN will monitor pt closely.   Jonnae Fonseca [employer]

## 2019-02-10 NOTE — Telephone Encounter (Signed)
Patient called reporting that she has been sick all weekend and thinks she needs IV fluids. Please advise Infusion can do it after lunch

## 2019-02-10 NOTE — Telephone Encounter (Addendum)
She just wants IVF no need to see you, can you put in orders for IV fluids please She is coming at 130

## 2019-02-10 NOTE — Telephone Encounter (Signed)
That sounds good to me. I would be happy to see her back in infusion if needed as well.   Faythe Casa, NP 02/10/2019 10:26 AM

## 2019-02-10 NOTE — Progress Notes (Signed)
Symptom Management Consult note [facility]  Telephone:([phone]) [phone] Fax:([phone]) [phone]  Patient Care Team: Arnetha Courser, MD as PCP - General (Family Medicine) Mellody Drown, MD as Consulting Physician (Obstetrics and Gynecology) Lenor Coffin, MD as Attending Physician (Gynecology) Lequita Asal, MD as Medical Oncologist (Hematology and Oncology) [name], MD as Consulting Physician (Gastroenterology) Michael Boston, MD as Consulting Physician (General Surgery) Lovell Sheehan, MD as Consulting Physician (Orthopedic Surgery) Clent Jacks, RN as Registered Nurse   Name of the patient: Joanna Hall  [id number]  [birth year]   Date of visit: 02/10/2019  Diagnosis: Cervical Cancer   Chief Complaint: Chest Pain  Current Treatment: Carbotaxol.  Recently DC'd carbo due to intolerable side effects (diarrhea).  Oncology History:  Oncology History   # dec 2017- CERVICAL ADENO CA [[place]]; Stage IB [Dr. Christene Slates at [place] [facility] in [place], [place];No surgery- Chemo-RT;  # 2018-sep - lung nodules [in [place]]  # AUG 20th, 2019-#1 Carbo-Taxol s/p 6 cycles- [No avastin sec to blood clots]: Feb 2020-bilateral lung nodules with 1 cm in size. March 2020- finished carbo-taxol #7; poor tolerance to [place].  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  She received treatment from 11/2016-05/2017.  01/2017 cisplatin x 2 and carboplatin x 1 (01/29/2017) due to ARF and XRT.  XRT was followed by T & O on 02/01/2017 and T & N 02/10/2017 and 02/20/2017.  Course was  complicated by 80 pound weight loss, nausea, vomiting, electrolyte wasting (potassium and magnesium).  She describes that.  Is been sick constantly requiring at least 20 hospitalizations.  Follow-up CT chest and PET on 06/2017. Per patient, 'radiation worked' and no disease in the abdomen.  At that time she was noted to have lung nodules that were growing and follow-up imaging was scheduled for 10/2017.  She was admitted to hospital in [place] for small bowel obstruction which was managed conservatively and she was home for a week prior to traveling to [place] for Thanksgiving holiday where she has family.  She presented to ER in [place] on 08/2017 with nausea, vomiting, and lower abdominal pain.  Symptoms did not respond to conservative treatment.  CT on 08/26/2017 revealed small bowel obstruction with transition in the pelvis just superior to the uterus rather was a long segment of distal ileum with fatty wall thickening compatible with chronic inflammation and/or radiation enteritis. Imaging showed numerous pulmonary nodules consistent with metastatic disease. She underwent laparotomy and right ileocolectomy on 08/31/2017 at [facility].  Surgical findings revealed a thickened, matted, and scarred piece of distal small bowel close to the ileocecal valve.  She was discharged on 09/05/2017.  Pain markedly increased in intensity and imaging was performed on 09/11/2017 which revealed: Debris within the anterior abdominal wall incision concerning for infection versus packing material, s/p post ileo-colectomy with expected postoperative changes, mild colonic ileus, numerous pulmonary nodules highly concerning for metastatic disease, punctate nonobstructing nephrolithiasis.  Staples were removed and one was packed.  She was started on doxycycline.  Abdominal and pelvic CT without contrast on 09/11/2017 revealed debris within anterior abdominal wall incision concerning for infection, versus packing  material.She is s/pileocolectomy  with expected postoperative changes and mild colonic ileus. There were numerous pulmonary nodules highly concerning for metastatic diseaseand punctate nonobstructing nephrolithiasis. She was readmitted on 09/12/2017. She describes the onset of lower abdominal pain on 09/09/2017. Pain markedly increased in intensity on 09/11/2017.  Staples were removed and the wound packed. She was started on doxycycline.  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  PET scan was ordered to evaluate enlarging lung nodules with concern for recurrent cervical cancer but scan was delayed due to insurance and need to be performed in [place].  Presented to ER on 12/03/2017 for abdominal pain and emesis.  Imaging concerning for worsening possible uterine infarct and she was admitted to hospital.  Pelvic MRI was unremarkable.  Remote scarring type changes of uterus thought to be possibly related to radiation.  She was discharged on 12/08/2017.  Underwent endoscopy and colonoscopy on 12/20/2017.    On 02/22/2018 she underwent laser ablation of condylomata around the anus and vagina under anesthesia with Dr. Johney Maine.   04/15/2018: Chest, abdomen, and pelvis CTrevealed innumerable (>100) cavitary nodules scattered in the lungs, moderately enlarging compared to the 11/08/2017 PET-CT, suspicious for metastatic disease. One index node in the RLL measures 1.0 x 1.1 cm (previously 0.6 x 0.6 cm). There were no new nodules. There was an ill-defined wall thickening in the rectosigmoid with surrounding stranding  along fascia planes, probably sequela from prior radiation therapy. There was multilevel lumbar impingement due to spondylosis and degenerative disc disease. There was heterogeneous enhancement in the uterus (some possibly from prior radiation therapy).  04/23/2018:PET scan revealednumerous scattered solid and cavitary nodules in the lungs stable increased in size compared to the prior PET-CT from 11/08/2017.Largest nodule was 1.1 cm in the LUL (SUV 1.9).These demonstratedlow-grade metabolic activity up to a maximum SUV of about 2.3, increased from 11/08/2017.   Case was discussed at tumor board on 04/25/2018. Consensus to pursue CT-guided biopsy (05/06/18) which revealed: Metastatic adenocarcinoma, morphologically consistent with cervical adenocarcinoma.  She has history of chronic hepatitis C which is managed by GI.  Hepatitis C genotype is 2a/2c.  She receives B12 injections for history of B12 deficiency.  On 04/24/2018 she underwent left total knee replacement with Dr. [name]. --------------------------------------------------------   DIAGNOSIS: Cervical adenocarcinoma  STAGE: IV        ;GOALS: Palliative  CURRENT/MOST RECENT THERAPY : Carbotaxol      Malignant neoplasm of overlapping sites of cervix ([place])   01/29/2019 -  Chemotherapy    The patient had PACLitaxel (TAXOL) 156 mg in sodium chloride 0.9 % 250 mL chemo infusion (</= 63m/m2), 80 mg/m2 = 156 mg, Intravenous,  Once, 1 of 4 cycles Administration: 156 mg (01/29/2019), 156 mg (02/05/2019)  for chemotherapy treatment.      Subjective Data:  ECOG: 1 - Symptomatic but completely ambulatory  Subjective:    KPEACHIE BARKALOWis a 53y.o. female who presents for evaluation of chest pain. Onset was 2 days ago. Symptoms have been stable since that time. The patient describes the pain as pressure and does not radiate. Patient rates pain as a 5/10 in intensity. Associated symptoms are: dyspnea, fatigue and palpitations.  Aggravating  factors are: none. Alleviating factors are: none. Patient's cardiac risk factors are: family history of premature cardiovascular disease, sedentary lifestyle and smoking/ tobacco exposure. Patient's risk factors for DVT/PE: none. Previous cardiac testing: none.  The following portions of the patient's history were reviewed and updated as appropriate: allergies, current medications, past family history, past medical history, past social history, past surgical history and problem list.  Review of Systems A comprehensive review of systems was negative except for: Respiratory: positive for dyspnea on exertion Cardiovascular: positive for chest pain, dyspnea and palpitations Gastrointestinal: positive for abdominal pain, diarrhea and nausea Musculoskeletal: positive for muscle weakness    Objective:    There were no vitals taken for this visit. General appearance: alert, fatigued and no distress Lungs: clear to auscultation bilaterally Heart: regular rate and rhythm, S1, S2 normal, no murmur, click, rub or gallop Abdomen: soft, non-tender; bowel sounds normal; no masses,  no organomegaly  Cardiographics ECG: normal sinus rhythm, no blocks or conduction defects, no ischemic changes  Imaging Chest x-ray: not indicated    Assessment:    Chest pain, suspected etiology: Unclear etiology.   History of heart murmur. History of electrolyte disturbance d/t diarrhea. History of anxiety. History of factor V Leiden; on anticoagulation.    Plan:   Plan:  Stat EKG. NSR.  Stat Labs.  Hypomagnesemia.  Chronic.  1.4 today. Troponin and CK normal.  Stage IV cervical cancer with metastatic disease to the lung: Received 7 cycles of carbotaxol.  Carbo discontinued secondary to diarrhea.  Has received 2 cycles single agent Taxol.  Last given on 02/05/2019.  Chest pain: Likely related to anxiety.  Cardiac work-up so far negative.  Complains of intermittent palpitations.  She denies history of  atrial fibrillation or other arrhythmia.  Does have history of heart murmur but no known cardiac defects.  History of factor V Leiden but currently anticoagulated.  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  Explained to her that if this continues or becomes more constant, she will need to go directly to the emergency room.  Otherwise, since this was an isolated event and has history of anxiety, we will continue to monitor.  Hypomagnesemia: d/t chronic diarrhea. Will replace with IV infusion this week.  Orders placed.   Disposition: RTC as scheduled on 02/12/19 for treatment and IV magnesium.  Greater than 50% was spent in counseling and coordination of care with this patient including but not limited to discussion of the relevant topics above (See A&P) including, but not limited to diagnosis and management of acute and chronic medical conditions.   Faythe Casa, NP 02/11/2019 2:49 PM

## 2019-02-12 ENCOUNTER — Ambulatory Visit: Payer: Medicaid Other

## 2019-02-12 ENCOUNTER — Encounter: Payer: Self-pay | Admitting: Internal Medicine

## 2019-02-12 ENCOUNTER — Other Ambulatory Visit: Payer: Medicaid Other

## 2019-02-12 ENCOUNTER — Ambulatory Visit: Payer: Medicaid Other | Admitting: Internal Medicine

## 2019-02-12 ENCOUNTER — Inpatient Hospital Stay (HOSPITAL_BASED_OUTPATIENT_CLINIC_OR_DEPARTMENT_OTHER): Payer: Medicaid Other | Admitting: Internal Medicine

## 2019-02-12 ENCOUNTER — Inpatient Hospital Stay: Payer: Medicaid Other

## 2019-02-12 ENCOUNTER — Other Ambulatory Visit: Payer: Self-pay

## 2019-02-12 ENCOUNTER — Inpatient Hospital Stay (HOSPITAL_BASED_OUTPATIENT_CLINIC_OR_DEPARTMENT_OTHER): Payer: Medicaid Other | Admitting: Hospice and Palliative Medicine

## 2019-02-12 VITALS — BP 101/66 | HR 79 | Temp 97.2°F | Resp 18 | Ht 69.0 in | Wt 173.6 lb

## 2019-02-12 DIAGNOSIS — Z515 Encounter for palliative care: Secondary | ICD-10-CM

## 2019-02-12 DIAGNOSIS — C538 Malignant neoplasm of overlapping sites of cervix uteri: Secondary | ICD-10-CM

## 2019-02-12 DIAGNOSIS — G893 Neoplasm related pain (acute) (chronic): Secondary | ICD-10-CM

## 2019-02-12 DIAGNOSIS — Z5111 Encounter for antineoplastic chemotherapy: Secondary | ICD-10-CM | POA: Diagnosis not present

## 2019-02-12 DIAGNOSIS — Z7189 Other specified counseling: Secondary | ICD-10-CM

## 2019-02-12 LAB — COMPREHENSIVE METABOLIC PANEL
ALT: 39 U/L (ref 0–44)
AST: 20 U/L (ref 15–41)
Albumin: 3.5 g/dL (ref 3.5–5.0)
Alkaline Phosphatase: 129 U/L — ABNORMAL HIGH (ref 38–126)
Anion gap: 6 (ref 5–15)
BUN: 12 mg/dL (ref 6–20)
CO2: 24 mmol/L (ref 22–32)
Calcium: 8.6 mg/dL — ABNORMAL LOW (ref 8.9–10.3)
Chloride: 106 mmol/L (ref 98–111)
Creatinine, Ser: 0.69 mg/dL (ref 0.44–1.00)
GFR calc Af Amer: >60 mL/min (ref >60)
GFR calc non Af Amer: >60 mL/min (ref >60)
Glucose, Bld: 91 mg/dL (ref 70–99)
Potassium: 4 mmol/L (ref 3.5–5.1)
Sodium: 136 mmol/L (ref 135–145)
Total Bilirubin: 0.3 mg/dL (ref 0.3–1.2)
Total Protein: 7 g/dL (ref 6.5–8.1)

## 2019-02-12 LAB — CBC WITH DIFFERENTIAL/PLATELET
Abs Immature Granulocytes: 0.01 10*3/uL (ref 0.00–0.07)
Basophils Absolute: 0 10*3/uL (ref 0.0–0.1)
Basophils Relative: 1 %
Eosinophils Absolute: 0 10*3/uL (ref 0.0–0.5)
Eosinophils Relative: 1 %
HCT: 24.6 % — ABNORMAL LOW (ref 36.0–46.0)
Hemoglobin: 8.3 g/dL — ABNORMAL LOW (ref 12.0–15.0)
Immature Granulocytes: 1 %
Lymphocytes Relative: 33 %
Lymphs Abs: 0.6 10*3/uL — ABNORMAL LOW (ref 0.7–4.0)
MCH: 35.3 pg — ABNORMAL HIGH (ref 26.0–34.0)
MCHC: 33.7 g/dL (ref 30.0–36.0)
MCV: 104.7 fL — ABNORMAL HIGH (ref 80.0–100.0)
Monocytes Absolute: 0.1 10*3/uL (ref 0.1–1.0)
Monocytes Relative: 6 %
Neutro Abs: 1.2 10*3/uL — ABNORMAL LOW (ref 1.7–7.7)
Neutrophils Relative %: 58 %
Platelets: 88 10*3/uL — ABNORMAL LOW (ref 150–400)
RBC: 2.35 MIL/uL — ABNORMAL LOW (ref 3.87–5.11)
RDW: 14.3 % (ref 11.5–15.5)
WBC: 1.9 10*3/uL — ABNORMAL LOW (ref 4.0–10.5)
nRBC: 0 % (ref 0.0–0.2)

## 2019-02-12 LAB — MAGNESIUM: Magnesium: 1.3 mg/dL — ABNORMAL LOW (ref 1.7–2.4)

## 2019-02-12 MED ORDER — HEPARIN SOD (PORK) LOCK FLUSH 100 UNIT/ML IV SOLN
500.0000 [IU] | Freq: Once | INTRAVENOUS | Status: AC
  Administered 2019-02-12: 500 [IU] via INTRAVENOUS
  Filled 2019-02-12: qty 5

## 2019-02-12 MED ORDER — MAGNESIUM SULFATE 4 GM/100ML IV SOLN
4.0000 g | Freq: Once | INTRAVENOUS | Status: AC
  Administered 2019-02-12: 4 g via INTRAVENOUS
  Filled 2019-02-12: qty 100

## 2019-02-12 MED ORDER — SODIUM CHLORIDE 0.9 % IV SOLN
Freq: Once | INTRAVENOUS | Status: AC
  Administered 2019-02-12: 09:00:00 via INTRAVENOUS
  Filled 2019-02-12: qty 250

## 2019-02-12 MED ORDER — FAMOTIDINE IN NACL 20-0.9 MG/50ML-% IV SOLN
20.0000 mg | Freq: Once | INTRAVENOUS | Status: AC
  Administered 2019-02-12: 20 mg via INTRAVENOUS
  Filled 2019-02-12: qty 50

## 2019-02-12 MED ORDER — SODIUM CHLORIDE 0.9 % IV SOLN
65.0000 mg/m2 | Freq: Once | INTRAVENOUS | Status: AC
  Administered 2019-02-12: 10:00:00 126 mg via INTRAVENOUS
  Filled 2019-02-12: qty 21

## 2019-02-12 MED ORDER — SODIUM CHLORIDE 0.9% FLUSH
10.0000 mL | Freq: Once | INTRAVENOUS | Status: AC
  Administered 2019-02-12: 10 mL via INTRAVENOUS
  Filled 2019-02-12: qty 10

## 2019-02-12 MED ORDER — DIPHENHYDRAMINE HCL 50 MG/ML IJ SOLN
50.0000 mg | Freq: Once | INTRAMUSCULAR | Status: AC
  Administered 2019-02-12: 10:00:00 50 mg via INTRAVENOUS
  Filled 2019-02-12: qty 1

## 2019-02-12 MED ORDER — DEXAMETHASONE SODIUM PHOSPHATE 10 MG/ML IJ SOLN
10.0000 mg | Freq: Once | INTRAMUSCULAR | Status: AC
  Administered 2019-02-12: 10 mg via INTRAVENOUS
  Filled 2019-02-12: qty 1

## 2019-02-12 MED ORDER — HEPARIN SOD (PORK) LOCK FLUSH 100 UNIT/ML IV SOLN: 500.0000 [IU] | Freq: Once | INTRAVENOUS | Status: DC | PRN

## 2019-02-12 NOTE — Progress Notes (Signed)
[place]  [phone] [phone]   Name: Joanna Hall Date: 02/12/2019 MRN: [id number]  DOB: 1966-04-25  Patient Care Team: Arnetha Courser, MD as PCP - General (Family Medicine) Mellody Drown, MD as Consulting Physician (Obstetrics and Gynecology) Lenor Coffin, MD as Attending Physician (Gynecology) Lequita Asal, MD as Medical Oncologist (Hematology and Oncology) [name], MD as Consulting Physician (Gastroenterology) Michael Boston, MD as Consulting Physician (General Surgery) Lovell Sheehan, MD as Consulting Physician (Orthopedic Surgery) Clent Jacks, RN as Registered Nurse    REASON FOR CONSULTATION: Palliative Care consult requested for this 53 y.o. 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  PMH also notable for history of right hemicolectomy with chronic inflammatory changes to the colon.  Patient is followed by GI.  She was recently hospitalized 11/19/2020 11/25/2018 with GI bleed.  Patient underwent flex sig on 2/6 but was mostly unremarkable except edematous mucosa.  Patient has chronic abdominal pain.  She presented to the ER on 12/16/2018 with acute on chronic intractable abdominal pain.  Patient had CT angio of the abdomen/pelvis, which revealed mucosal enhancement and wall thickening in the rectum and sigmoid colon but was essentially unchanged from previous studies.  Palliative care was consulted to help address symptoms and goals.  SOCIAL HISTORY:    Patient is not married.  She lives at home with her mother and father.  She has an adult daughter who lives in [place], [place].  She had another daughter who is now deceased.  ADVANCE DIRECTIVES:  Not on file   CODE STATUS:   PAST MEDICAL HISTORY: Past Medical History:  Diagnosis Date   Abdominal  pain 06/10/2018   Abnormal cervical Papanicolaou smear 09/18/2017   Anxiety    Aortic atherosclerosis (HCC)    Arthritis    neck and knees   Blood clots in brain    both lungs and right kidney   Blood transfusion without reported diagnosis    Cervical cancer ([place]) 09/2016   mets lung   Chronic anal fissure    Chronic diarrhea    Dyspnea    Erosive gastropathy 09/18/2017   Factor V Leiden mutation ([place])    Fecal incontinence    Genital warts    GERD (gastroesophageal reflux disease)    Heart murmur    Hematochezia    Hemorrhoids    Hepatitis C    Chronic, 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   Intestinal infection due to Clostridium difficile 09/18/2017   Macrocytic anemia with vitamin B12 deficiency    Multiple gastric ulcers    Nausea vomiting and diarrhea    Pancolitis ([place]) 07/27/2018   Perianal condylomata    Pneumonia    History of   Pulmonary nodules    Rectal bleeding    Small bowel obstruction ([place]) 08/2017   Stiff neck    limited right turn   Vitamin D deficiency     PAST SURGICAL HISTORY:  Past Surgical History:  Procedure Laterality Date   CHOLECYSTECTOMY     COLON SURGERY  08/2017   resection   COLONOSCOPY WITH PROPOFOL N/A 12/20/2017   Procedure: COLONOSCOPY WITH PROPOFOL;  Surgeon: [name], MD;  Location: [facility] ENDOSCOPY;  Service: Gastroenterology;  Laterality: N/A;   COLONOSCOPY WITH PROPOFOL N/A 07/30/2018   Procedure: COLONOSCOPY WITH PROPOFOL;  Surgeon: [name], MD;  Location: [facility] ENDOSCOPY;  Service: Gastroenterology;  Laterality: N/A;   COLONOSCOPY WITH PROPOFOL N/A 10/10/2018   Procedure: COLONOSCOPY WITH  PROPOFOL;  Surgeon: Lucilla Lame, MD;  Location: [facility] ENDOSCOPY;  Service: Endoscopy;  Laterality: N/A;   DIAGNOSTIC LAPAROSCOPY     ESOPHAGOGASTRODUODENOSCOPY (EGD) WITH PROPOFOL N/A 12/20/2017   Procedure: ESOPHAGOGASTRODUODENOSCOPY (EGD) WITH PROPOFOL;  Surgeon: [name], MD;  Location: [facility] ENDOSCOPY;  Service: Gastroenterology;  Laterality: N/A;   ESOPHAGOGASTRODUODENOSCOPY (EGD) WITH PROPOFOL  07/30/2018   Procedure: ESOPHAGOGASTRODUODENOSCOPY (EGD) WITH PROPOFOL;  Surgeon: [name], MD;  Location: [facility] ENDOSCOPY;  Service: Gastroenterology;;   [place] N/A 11/21/2018   Procedure: FLEXIBLE SIGMOIDOSCOPY;  Surgeon: [name], MD;  Location: [facility] ENDOSCOPY;  Service: Gastroenterology;  Laterality: N/A;   LAPAROTOMY N/A 08/31/2017   Procedure: EXPLORATORY LAPAROTOMY for SBO, ileocolectomy, removal of piece of uterine wall;  Surgeon: [name], MD;  Location: [facility] ORS;  Service: General;  Laterality: N/A;   LASER ABLATION CONDOLAMATA N/A 02/22/2018   Procedure: LASER ABLATION/REMOVAL OF [id number] AROUND ANUS AND VAGINA;  Surgeon: Michael Boston, MD;  Location: [place];  Service: General;  Laterality: N/A;   OOPHORECTOMY     PORTA CATH INSERTION N/A 05/13/2018   Procedure: PORTA CATH INSERTION;  Surgeon: Katha Cabal, MD;  Location: [facility];  Service: Cardiovascular;  Laterality: N/A;   SMALL INTESTINE SURGERY     TANDEM RING INSERTION     x3   THORACOTOMY     TOTAL KNEE ARTHROPLASTY Left 04/24/2018   Procedure: TOTAL KNEE ARTHROPLASTY;  Surgeon: Lovell Sheehan, MD;  Location: [facility] ORS;  Service: Orthopedics;  Laterality: Left;    HEMATOLOGY/ONCOLOGY HISTORY:  Oncology History   # dec 2017- CERVICAL ADENO CA []; Stage IB [Dr. Christene Slates at [place] [facility] in [place], [place];No surgery- Chemo-RT;  # 2018-sep - lung nodules [in [place]]  # AUG 20th, 2019-#1  Carbo-Taxol s/p 6 cycles- [No avastin sec to blood clots]: Feb 2020-bilateral lung nodules with 1 cm in size. March 2020- finished carbo-taxol #7; poor tolerance to [place].  # April 15 th 2020- Start Taxol weekly x 3; one week OFF  # summer-/fall 2019-Bil PE/kidney infract; factor V Leiden Leiden - xarelto [small bowel obstruction]; moved to [facility]   #Fall 2019 PE/renal infarct on Xarelto-factor V Leiden  ------------------------------------------------------------- She was treated by  Decision was made to pursue concurrent chemotherapy (weekly cisplatin) and radiation.  She received treatment from 11/2016-05/2017.  01/2017 cisplatin x 2 and carboplatin x 1 (01/29/2017) due to ARF and XRT.  XRT was followed by T & O on 02/01/2017 and T & N 02/10/2017 and 02/20/2017.  Course was complicated by 80 pound weight loss, nausea, vomiting, electrolyte wasting (potassium and magnesium).  She describes that.  Is been sick constantly requiring at least 20 hospitalizations.  Follow-up CT chest and PET on 06/2017. Per patient, 'radiation worked' and no disease in the abdomen.  At that time she was noted to have lung nodules that were growing and follow-up imaging was scheduled for 10/2017.  She was admitted to hospital in [place] for small bowel obstruction which was managed conservatively and  she was home for a week prior to traveling to [place] for Thanksgiving holiday where she has family.  She presented to ER in [place] on 08/2017 with nausea, vomiting, and lower abdominal pain.  Symptoms did not respond to conservative treatment.  CT on 08/26/2017 revealed small bowel obstruction with transition in the pelvis just superior to the uterus rather was a long segment of distal ileum with fatty wall thickening compatible with chronic inflammation and/or radiation enteritis. Imaging showed numerous pulmonary nodules consistent with metastatic disease. She underwent laparotomy and right ileocolectomy on  08/31/2017 at [facility].  Surgical findings revealed a thickened, matted, and scarred piece of distal small bowel close to the ileocecal valve.  She was discharged on 09/05/2017.  Pain markedly increased in intensity and imaging was performed on 09/11/2017 which revealed: Debris within the anterior abdominal wall incision concerning for infection versus packing material, s/p post ileo-colectomy with expected postoperative changes, mild colonic ileus, numerous pulmonary nodules highly concerning for metastatic disease, punctate nonobstructing nephrolithiasis.  Staples were removed and one was packed.  She was started on doxycycline.  Abdominal and pelvic CT without contrast on 09/11/2017 revealed debris within anterior abdominal wall incision concerning for infection, versus packing material.She is s/pileocolectomy with expected postoperative changes and mild colonic ileus. There were numerous pulmonary nodules highly concerning for metastatic diseaseand punctate nonobstructing nephrolithiasis. She was readmitted on 09/12/2017. She describes the onset of lower abdominal pain on 09/09/2017. Pain markedly increased in intensity on 09/11/2017.  Staples were removed and the wound packed. She was started on doxycycline.  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  PET scan was ordered to evaluate enlarging lung nodules with concern for recurrent cervical cancer but scan was delayed due to insurance and need to be performed in [place].  Presented to ER on 12/03/2017 for abdominal pain and emesis.  Imaging concerning for  worsening possible uterine infarct and she was admitted to hospital.  Pelvic MRI was unremarkable.  Remote scarring type changes of uterus thought to be possibly related to radiation.  She was discharged on 12/08/2017.  Underwent endoscopy and colonoscopy on 12/20/2017.    On 02/22/2018 she underwent laser ablation of condylomata around the anus and vagina under anesthesia with Dr. Johney Maine.   04/15/2018: Chest, abdomen, and pelvis CTrevealed innumerable (>100) cavitary nodules scattered in the lungs, moderately enlarging compared to the 11/08/2017 PET-CT, suspicious for metastatic disease. One index node in the RLL measures 1.0 x 1.1 cm (previously 0.6 x 0.6 cm). There were no new nodules. There was an ill-defined wall thickening in the rectosigmoid with surrounding stranding along fascia planes, probably sequela from prior radiation therapy. There was multilevel lumbar impingement due to spondylosis and degenerative disc disease. There was heterogeneous enhancement in the uterus (some possibly from prior radiation therapy).  04/23/2018:PET scan revealednumerous scattered solid and cavitary nodules in the lungs stable increased in size compared to the prior PET-CT from 11/08/2017.Largest nodule was 1.1 cm in the LUL (SUV 1.9).These demonstratedlow-grade metabolic activity up to a maximum SUV of about 2.3, increased from 11/08/2017.   Case was discussed at tumor board on 04/25/2018. Consensus to pursue CT-guided biopsy (05/06/18)  which revealed: Metastatic adenocarcinoma, morphologically consistent with cervical adenocarcinoma.  She has history of chronic hepatitis C which is managed by GI.  Hepatitis C genotype is 2a/2c.  She receives B12 injections for history of B12 deficiency.  On 04/24/2018 she underwent left total knee replacement with Dr. [name]. --------------------------------------------------------   DIAGNOSIS: Cervical adenocarcinoma  STAGE: IV        ;GOALS:  Palliative  CURRENT/MOST RECENT THERAPY : Carbotaxol      Malignant neoplasm of overlapping sites of cervix ([place])   01/29/2019 -  Chemotherapy    The patient had PACLitaxel (TAXOL) 156 mg in sodium chloride 0.9 % 250 mL chemo infusion (</= 55m/m2), 80 mg/m2 = 156 mg, Intravenous,  Once, 1 of 4 cycles Dose modification: 65 mg/m2 (original dose 80 mg/m2, Cycle 1, Reason: Provider Judgment) Administration: 156 mg (01/29/2019), 156 mg (02/05/2019), 126 mg (02/12/2019)  for chemotherapy treatment.      ALLERGIES:  is allergic to ketamine.  MEDICATIONS:  Current Outpatient Medications  Medication Sig Dispense Refill   amitriptyline (ELAVIL) 75 MG tablet Take 1 tablet (75 mg total) by mouth at bedtime. 90 tablet 1   budesonide (ENTOCORT EC) 3 MG 24 hr capsule Take 3 capsules (9 mg total) by mouth daily. 30 capsule 0   Calcium Carb-Cholecalciferol (CALCIUM 500 +D) 500-400 MG-UNIT TABS Take 2 tablets by mouth daily.     diphenoxylate-atropine (LOMOTIL) 2.5-0.025 MG tablet TAKE 2 TABLETS BY MOUTH FOUR TIMES A DAY 240 tablet 0   famotidine (PEPCID) 20 MG tablet Take 1 tablet (20 mg total) by mouth 2 (two) times daily as needed for heartburn or indigestion.     hyoscyamine (LEVBID) 0.375 MG 12 hr tablet Take 1 tablet (0.375 mg total)by mouth 2 (two) times daily. 60 tablet 0   meclizine (ANTIVERT) 25 MG tablet Take 1 tablet (25 mg total) by mouth 2 (two) times daily as needed for dizziness. 15 tablet 0   Multiple Vitamins-Minerals (MULTIVITAMIN WITH MINERALS) tablet Take 1 tablet by mouth daily. 30 tablet 0   ondansetron (ZOFRAN ODT) 4 MG disintegrating tablet Take 1 tablet (4 mg total) by mouth every 8 (eight) hours as needed for nausea or vomiting. 20 tablet 0   oxyCODONE (OXYCONTIN) 15 mg 12 hr tablet Take 1 tablet (15 mg total) by mouth every 12 (twelve) hours. 30 tablet 0   Oxycodone HCl 10 MG TABS Take 1 tablet (10 mg total) by mouth every 6 (six) hours as needed. 60 tablet 0    pantoprazole (PROTONIX) 40 MG tablet Take 1 tablet (40 mg total) by mouth daily. 90 tablet 0   potassium chloride SA (K-DUR,KLOR-CON) 20 MEQ tablet Take 1 tablet (20 mEq total) by mouth daily. 30 tablet 3   promethazine (PHENERGAN) 25 MG tablet Take 1 tablet (25 mg total) by mouth every 8 (eight) hours as needed for nausea or vomiting. 30 tablet 3   rivaroxaban (XARELTO) 20 MG TABS tablet Take 20 mg by mouth daily with supper.     VENTOLIN HFA 108 (90 Base) MCG/ACT inhaler Inhale 1-2 puffs into the lungs every 4 (four) hours as needed for shortness of breath.   0   VIBERZI 100 MG TABS TAKE 1 TABLET BY MOUTH TWICE DAILY WITH A MEAL 60 tablet 0   No current facility-administered medications for this visit.    Facility-Administered Medications Ordered in Other Visits  Medication Dose Route Frequency Provider Last Rate Last Dose   heparin lock flush 100 unit/mL  500 Units Intravenous Once  Lequita Asal, MD       heparin lock flush 100 unit/mL  500 Units Intravenous Once Charlaine Dalton R, MD       heparin lock flush 100 unit/mL  500 Units Intracatheter Once PRN Cammie Sickle, MD       magnesium sulfate IVPB 4 g 100 mL  4 g Intravenous Once Cammie Sickle, MD 55 mL/hr at 02/12/19 0954 4 g at 02/12/19 0954   PACLitaxel (TAXOL) 126 mg in sodium chloride 0.9 % 250 mL chemo infusion (</= 30m/m2)  65 mg/m2 (Treatment Plan Recorded) Intravenous Once BCammie Sickle MD 271 mL/hr at 02/12/19 1026 126 mg at 02/12/19 1026   sodium chloride 0.9 % 1,000 mL with potassium chloride 20 mEq infusion   Intravenous Once GKaren Kitchens NP        VITAL SIGNS: There were no vitals taken for this visit. There were no vitals filed for this visit.  Estimated body mass index is 25.64 kg/m as calculated from the following:   Height as of an earlier encounter on 02/12/19: 5' 9"  (1.753 m).   Weight as of an earlier encounter on 02/12/19: 173 lb 9.6 oz (78.7 kg).  LABS: CBC:     Component Value Date/Time   WBC 1.9 (L) 02/12/2019 0824   HGB 8.3 (L) 02/12/2019 0824   HCT 24.6 (L) 02/12/2019 0824   PLT 88 (L) 02/12/2019 0824   MCV 104.7 (H) 02/12/2019 0824   NEUTROABS 1.2 (L) 02/12/2019 0824   LYMPHSABS 0.6 (L) 02/12/2019 0824   MONOABS 0.1 02/12/2019 0824   EOSABS 0.0 02/12/2019 0824   BASOSABS 0.0 02/12/2019 0824   Comprehensive Metabolic Panel:    Component Value Date/Time   NA 136 02/12/2019 0824   K 4.0 02/12/2019 0824   CL 106 02/12/2019 0824   CO2 24 02/12/2019 0824   BUN 12 02/12/2019 0824   CREATININE 0.69 02/12/2019 0824   GLUCOSE 91 02/12/2019 0824   CALCIUM 8.6 (L) 02/12/2019 0824   AST 20 02/12/2019 0824   ALT 39 02/12/2019 0824   ALKPHOS 129 (H) 02/12/2019 0824   BILITOT 0.3 02/12/2019 0824   PROT 7.0 02/12/2019 0824   ALBUMIN 3.5 02/12/2019 0824    RADIOGRAPHIC STUDIES: Ct Head Wo Contrast  Result Date: 01/26/2019 CLINICAL DATA:  Altered level of consciousness. EXAM: CT HEAD WITHOUT CONTRAST TECHNIQUE: Contiguous axial images were obtained from the base of the skull through the vertex without intravenous contrast. COMPARISON:  None. FINDINGS: Brain: No evidence of acute infarction, hemorrhage, hydrocephalus, extra-axial collection or mass lesion/mass effect. Vascular: No hyperdense vessel or unexpected calcification. Skull: Normal. Negative for fracture or focal lesion. Sinuses/Orbits: No acute finding. Other: None. IMPRESSION: Normal head CT. Electronically Signed   By: JMarijo Conception M.D.   On: 01/26/2019 22:19   Ct Abdomen Pelvis W Contrast  Result Date: 01/23/2019 CLINICAL DATA:  Severe pelvic pain with nausea.  Cervical cancer. EXAM: CT ABDOMEN AND PELVIS WITH CONTRAST TECHNIQUE: Multidetector CT imaging of the abdomen and pelvis was performed using the standard protocol following bolus administration of intravenous contrast. CONTRAST:  889mOMNIPAQUE IOHEXOL 300 MG/ML  SOLN COMPARISON:  12/16/2018 FINDINGS: Lower chest: Multiple  pulmonary metastases again noted without substantial interval change. 8 mm index lesion posterior left lower lobe (11/3) is stable since prior. Hepatobiliary: No suspicious focal abnormality within the liver parenchyma. Trace prominence intrahepatic bile ducts with extrahepatic common duct measuring 14 mm diameter, stable since prior study. Gallbladder surgically absent. Pancreas: No  focal mass lesion. No dilatation of the main duct. No intraparenchymal cyst. No peripancreatic edema. Spleen: No splenomegaly. No focal mass lesion. Adrenals/Urinary Tract: No adrenal nodule or mass. 1 mm nonobstructing stone identified lower pole right kidney (34/2). Left kidney unremarkable. No evidence for hydroureter. The urinary bladder appears normal for the degree of distention. Stomach/Bowel: Stomach is unremarkable. No gastric wall thickening. No evidence of outlet obstruction. Duodenum is normally positioned as is the ligament of Treitz. No small bowel wall thickening. No small bowel dilatation. Status post right hemicolectomy. Wall thickening in the sigmoid colon and rectum is similar to prior. Vascular/Lymphatic: There is abdominal aortic atherosclerosis without aneurysm. 12 mm short axis hepato duodenal ligament lymph node (19/2) is stable. Other upper normal to borderline enlarged hepato duodenal ligament lymph nodes are similar. No para-aortic lymphadenopathy by size criteria. No pelvic sidewall lymphadenopathy. Reproductive: The uterus is unremarkable.  There is no adnexal mass. Other: No intraperitoneal free fluid. Musculoskeletal: Presacral and perirectal edema is similar to prior. Similar appearance diffusely heterogeneous bone mineralization. Lumbar scoliosis with advanced degenerative disc disease. Tiny gas locules to the right of the L4-5 interspace are compatible with degenerative change. IMPRESSION: 1. No substantial interval change in exam. No findings to explain the patient's history of pelvic pain. 2. Similar  appearance of pulmonary metastases identified in the lung bases. 3. Stable extrahepatic biliary duct dilatation in this patient status post cholecystectomy. 4. Wall thickening in the sigmoid colon and rectum is similar to prior. Perirectal and presacral edema also not substantially changed. Features likely reflect sequelae of radiation therapy. Electronically Signed   By: Misty Stanley M.D.   On: 01/23/2019 14:01   US Venous Img Lower Unilateral Left  Result Date: 01/22/2019 CLINICAL DATA:  Left leg swelling for 1 week EXAM: LEFT LOWER EXTREMITY VENOUS DOPPLER ULTRASOUND TECHNIQUE: Gray-scale sonography with graded compression, as well as color Doppler and duplex ultrasound were performed to evaluate the lower extremity deep venous systems from the level of the common femoral vein and including the common femoral, femoral, profunda femoral, popliteal and calf veins including the posterior tibial, peroneal and gastrocnemius veins when visible. The superficial great saphenous vein was also interrogated. Spectral Doppler was utilized to evaluate flow at rest and with distal augmentation maneuvers in the common femoral, femoral and popliteal veins. COMPARISON:  None. FINDINGS: Contralateral Common Femoral Vein: Respiratory phasicity is normal and symmetric with the symptomatic side. No evidence of thrombus. Normal compressibility. Common Femoral Vein: No evidence of thrombus. Normal compressibility, respiratory phasicity and response to augmentation. Saphenofemoral Junction: No evidence of thrombus. Normal compressibility and flow on color Doppler imaging. Profunda Femoral Vein: No evidence of thrombus. Normal compressibility and flow on color Doppler imaging. Femoral Vein: No evidence of thrombus. Normal compressibility, respiratory phasicity and response to augmentation. Popliteal Vein: No evidence of thrombus. Normal compressibility, respiratory phasicity and response to augmentation. Calf Veins: No evidence of  thrombus. Normal compressibility and flow on color Doppler imaging. Superficial Great Saphenous Vein: No evidence of thrombus. Normal compressibility. Venous Reflux:  None. Other Findings:  None. IMPRESSION: No evidence of deep venous thrombosis. These results will be called to the ordering clinician or representative by the Radiology Department at the imaging location. Electronically Signed   By: Inez Catalina M.D.   On: 01/22/2019 16:21   Dg Chest Portable 1 View  Result Date: 01/26/2019 CLINICAL DATA:  Current history of metastatic ovarian cancer, presenting with acute cough, generalized weakness and nausea. EXAM: PORTABLE CHEST 1 VIEW COMPARISON:  12/25/2018 and earlier, including CT chest 09/30/2018. FINDINGS: Cardiac silhouette normal in size, unchanged. Thoracic aorta mildly atherosclerotic, unchanged. Hilar and mediastinal contours otherwise unremarkable. The multiple small nodules throughout both lungs identified on the prior CT are inconspicuous on the portable x-ray. No new or suspicious findings in either lung. No pleural effusions. RIGHT jugular Port-A-Cath tip projects over the mid SVC, unchanged. IMPRESSION: 1. No acute cardiopulmonary disease. 2. The multiple small nodules throughout both lungs identified on the prior CT are inconspicuous on the portable x-ray. Electronically Signed   By: Evangeline Dakin M.D.   On: 01/26/2019 16:20    PERFORMANCE STATUS (ECOG) : 1 - Symptomatic but completely ambulatory  REVIEW OF SYSTEMS: As noted above. Otherwise, a complete review of systems is negative.  PHYSICAL EXAM: General: NAD, frail appearing, thin Pulmonary: Unlabored Extremities: no edema Skin: no rashes Neurological: Weakness but otherwise nonfocal  IMPRESSION: Patient was seen today for routine follow-up. She was seen while receiving an infusion.   Patient reports that her pain is stable on current regimen of oxycodone/OxyContin. She continues to have intermittent diarrhea and says  that she feels it will likely be a chronic issue for life. Appetite is reportedly stable. I note that weight is stable at 173lbs over past month.   Patient denies any other acute changes or concerns today.   UDS noted from 4/9. Appropriate for oxycodone/metabolites. Morphine +. Will send comprehensive UDS to [facility].  PLAN: -Continue current scope of treatment -Continue oxycodone 10 mg every 6 hours as needed for pain  -Continue OxyContin 15 mg every 12 hours -Plan comprehensive UDS  -Will plan to complete MOST form during future visit -RTC 1 month   Patient expressed understanding and was in agreement with this plan. She also understands that She can call clinic at any time with any questions, concerns, or complaints.     Time Total: 10 minutes  Visit consisted of counseling and education dealing with the complex and emotionally intense issues of symptom management and palliative care in the setting of serious and potentially life-threatening illness.Greater than 50%  of this time was spent counseling and coordinating care related to the above assessment and [email or web address]  Signed by: Altha Harm, PhD, NP-C [id number] (Work Cell)

## 2019-02-12 NOTE — Progress Notes (Signed)
[place] NOTE  Patient Care Team: Lada, Satira Anis, MD as PCP - General (Family Medicine) Mellody Drown, MD as Consulting Physician (Obstetrics and Gynecology) Lenor Coffin, MD as Attending Physician (Gynecology) Lequita Asal, MD as Medical Oncologist (Hematology and Oncology) [name], MD as Consulting Physician (Gastroenterology) Michael Boston, MD as Consulting Physician (General Surgery) Lovell Sheehan, MD as Consulting Physician (Orthopedic Surgery) Clent Jacks, RN as Registered Nurse  CHIEF COMPLAINTS/PURPOSE OF CONSULTATION: Cervical cancer  #  Oncology History   # dec 2017- CERVICAL ADENO CA [[name]]; Stage IB [Dr. Christene Slates at [facility] in [place], [place];No surgery- Chemo-RT;  # 2018-sep - lung nodules [in [place]]  # AUG 20th, 2019-#1 Carbo-Taxol s/p 6 cycles- [No avastin sec to blood clots]: Feb 2020-bilateral lung nodules with 1 cm in size. March 2020- finished carbo-taxol #7; poor tolerance to [place].  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  She received treatment from 11/2016-05/2017.  01/2017 cisplatin x 2 and carboplatin x 1 (01/29/2017) due to ARF and XRT.  XRT was followed by T & O on 02/01/2017 and T & N 02/10/2017 and 02/20/2017.  Course was complicated by 80 pound weight loss, nausea, vomiting, electrolyte wasting (potassium and magnesium).  She describes that.  Is been sick constantly requiring at least 20 hospitalizations.  Follow-up CT chest and PET on 06/2017. Per patient, 'radiation worked' and no disease in the  abdomen.  At that time she was noted to have lung nodules that were growing and follow-up imaging was scheduled for 10/2017.  She was admitted to hospital in [place] for small bowel obstruction which was managed conservatively and she was home for a week prior to traveling to [place] for Thanksgiving holiday where she has family.  She presented to ER in [place] on 08/2017 with nausea, vomiting, and lower abdominal pain.  Symptoms did not respond to conservative treatment.  CT on 08/26/2017 revealed small bowel obstruction with transition in the pelvis just superior to the uterus rather was a long segment of distal ileum with fatty wall thickening compatible with chronic inflammation and/or radiation enteritis. Imaging showed numerous pulmonary nodules consistent with metastatic disease. She underwent laparotomy and right ileocolectomy on 08/31/2017 at [facility].  Surgical findings revealed a thickened, matted, and scarred piece of distal small bowel close to the ileocecal valve.  She was discharged on 09/05/2017.  Pain markedly increased in intensity and imaging was performed on 09/11/2017 which revealed: Debris within the anterior abdominal wall incision concerning for infection versus packing material, s/p post ileo-colectomy with expected postoperative changes, mild colonic ileus, numerous pulmonary nodules highly concerning for metastatic disease, punctate nonobstructing nephrolithiasis.  Staples were removed and one was packed.  She was started on doxycycline.  Abdominal and pelvic CT without contrast on 09/11/2017 revealed debris within anterior abdominal wall incision concerning for infection, versus packing material.She is s/pileocolectomy with expected postoperative changes and mild colonic ileus. There were numerous pulmonary nodules highly concerning for metastatic diseaseand punctate nonobstructing nephrolithiasis. She was readmitted on 09/12/2017. She describes the onset  of lower abdominal pain on 09/09/2017. Pain markedly increased in intensity on 09/11/2017.  Staples were removed and the wound packed. She was started on doxycycline.  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  PET scan was ordered to evaluate enlarging lung nodules with concern for recurrent cervical cancer but scan was delayed due to insurance and need to be performed in [place].  Presented to ER on 12/03/2017 for abdominal pain and emesis.  Imaging concerning for worsening possible uterine infarct and she was admitted to hospital.  Pelvic MRI was unremarkable.  Remote scarring type changes of uterus thought to be possibly related to radiation.  She was discharged on 12/08/2017.  Underwent endoscopy and colonoscopy on 12/20/2017.    On 02/22/2018 she underwent laser ablation of condylomata around the anus and vagina under anesthesia with Dr. Johney Maine.   04/15/2018: Chest, abdomen, and pelvis CTrevealed innumerable (>100) cavitary nodules scattered in the lungs, moderately enlarging compared to the 11/08/2017 PET-CT, suspicious for metastatic disease. One index node in the RLL measures 1.0 x 1.1 cm (previously 0.6 x 0.6 cm). There were no new nodules. There was an ill-defined wall thickening in the rectosigmoid with surrounding stranding along fascia planes, probably sequela from prior radiation therapy. There was multilevel lumbar impingement due to spondylosis and degenerative disc disease. There was heterogeneous enhancement in the uterus (some possibly from prior radiation therapy).  04/23/2018:PET scan  revealednumerous scattered solid and cavitary nodules in the lungs stable increased in size compared to the prior PET-CT from 11/08/2017.Largest nodule was 1.1 cm in the LUL (SUV 1.9).These demonstratedlow-grade metabolic activity up to a maximum SUV of about 2.3, increased from 11/08/2017.   Case was discussed at tumor board on 04/25/2018. Consensus to pursue CT-guided biopsy (05/06/18) which revealed: Metastatic adenocarcinoma, morphologically consistent with cervical adenocarcinoma.  She has history of chronic hepatitis C which is managed by GI.  Hepatitis C genotype is 2a/2c.  She receives B12 injections for history of B12 deficiency.  On 04/24/2018 she underwent left total knee replacement with Dr. [name]. --------------------------------------------------------   DIAGNOSIS: Cervical adenocarcinoma  STAGE: IV        ;GOALS: Palliative  CURRENT/MOST RECENT THERAPY : Carbotaxol      Malignant neoplasm of overlapping sites of cervix ([place])   01/29/2019 -  Chemotherapy    The patient had PACLitaxel (TAXOL) 156 mg in sodium chloride 0.9 % 250 mL chemo infusion (</= 53m/m2), 80 mg/m2 = 156 mg, Intravenous,  Once, 1 of 4 cycles Dose modification: 65 mg/m2 (original dose 80 mg/m2, Cycle 1, Reason: Provider Judgment) Administration: 156 mg (01/29/2019), 156 mg (02/05/2019)  for chemotherapy treatment.       HISTORY OF PRESENTING ILLNESS:  Joanna [id number].  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  Patient states that she feels much improved since being on simulating Taxol.  Continues to have intermittent diarrhea but not any worse.  Abdominal pain is improved.  She had episode of chest pain while getting the IV fluids earlier in the week.  Currently resolved.  Not associate with exertion.  Denies any tingling or numbness.   Review of Systems  Constitutional: Positive for malaise/fatigue. Negative for chills,  diaphoresis, fever and weight loss.  HENT: Negative for nosebleeds and sore throat.   Eyes: Negative for double vision.  Respiratory: Negative for cough, hemoptysis, sputum production, shortness of breath and wheezing.   Cardiovascular: Negative for chest pain, palpitations, orthopnea and leg swelling.  Gastrointestinal: Positive for abdominal pain. Negative for blood in stool, constipation, diarrhea, heartburn, melena, nausea and vomiting.  Genitourinary: Negative for dysuria, frequency and urgency.  Musculoskeletal: Negative for back pain and joint pain.  Skin: Negative.  Negative for itching and rash.  Neurological: Negative for dizziness, tingling, focal weakness, weakness and headaches.  Endo/Heme/Allergies: Does not bruise/bleed easily.  Psychiatric/Behavioral: Negative for depression. The patient is not nervous/anxious and does not have insomnia.      MEDICAL HISTORY:  Past Medical History:  Diagnosis Date  . Abdominal pain 06/10/2018  . Abnormal cervical Papanicolaou smear 09/18/2017  . Anxiety   . Aortic atherosclerosis ([place])   . Arthritis    neck and knees  . 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 Blood transfusion without reported diagnosis   . Cervical cancer ([place]) 09/2016   mets lung  . Chronic anal fissure   . Chronic diarrhea   . Dyspnea   . Erosive gastropathy 09/18/2017  . Factor V Leiden mutation ([place])   . Fecal incontinence   . Genital warts   . GERD (gastroesophageal reflux disease)   . Heart murmur   . Hematochezia   . Hemorrhoids   . 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 Hepatitis, chronic ([place]) 05/05/2017  . History of cancer chemotherapy    completed 06/2017  . History of Clostridium difficile infection    while undergoing chemo.  Negative test 10/2017  . Ileocolic anastomotic leak   . 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 Intestinal infection due to Clostridium difficile 09/18/2017  .  Macrocytic anemia with vitamin B12 deficiency   . Multiple gastric ulcers   . Nausea vomiting and diarrhea   . Pancolitis ([place]) 07/27/2018  . Perianal condylomata   . Pneumonia    History of  . Pulmonary nodules   . Rectal bleeding   . Small bowel obstruction ([place]) 08/2017  . Stiff neck    limited right turn  . Vitamin D deficiency     SURGICAL HISTORY: Past Surgical History:  Procedure Laterality Date  . CHOLECYSTECTOMY    . COLON SURGERY  08/2017   resection  . COLONOSCOPY WITH PROPOFOL N/A 12/20/2017   Procedure: COLONOSCOPY WITH PROPOFOL;  Surgeon: [name], MD;  [facility] ENDOSCOPY;  Service: Gastroenterology;  Laterality: N/A;  . COLONOSCOPY WITH PROPOFOL N/A 07/30/2018   Procedure: COLONOSCOPY WITH PROPOFOL;  Surgeon: [name], MD;  Location: [facility] ENDOSCOPY;  Service: Gastroenterology;  Laterality: N/A;  . COLONOSCOPY WITH PROPOFOL N/A 10/10/2018   Procedure: COLONOSCOPY WITH PROPOFOL;  Surgeon: Lucilla Lame, MD;  Location: [facility] ENDOSCOPY;  Service: Endoscopy;  Laterality: N/A;  . DIAGNOSTIC LAPAROSCOPY    . ESOPHAGOGASTRODUODENOSCOPY (EGD) WITH PROPOFOL N/A 12/20/2017   Procedure: ESOPHAGOGASTRODUODENOSCOPY (EGD) WITH PROPOFOL;  Surgeon: [name], MD;  Location: [facility];  Service: Gastroenterology;  Laterality: N/A;  . ESOPHAGOGASTRODUODENOSCOPY (EGD) WITH PROPOFOL  07/30/2018   Procedure: ESOPHAGOGASTRODUODENOSCOPY (EGD) WITH PROPOFOL;  Surgeon: [name], MD;  Location: [facility] ENDOSCOPY;  Service: Gastroenterology;;  . Otho Darner SIGMOIDOSCOPY N/A 11/21/2018   Procedure: FLEXIBLE SIGMOIDOSCOPY;  Surgeon: [name],  MD;  Location: [facility] ENDOSCOPY;  Service: Gastroenterology;  Laterality: N/A;  . LAPAROTOMY N/A 08/31/2017   Procedure: EXPLORATORY LAPAROTOMY for SBO, ileocolectomy, removal of piece of uterine wall;  Surgeon: [name], MD;  Location: [facility] ORS;  Service: General;  Laterality: N/A;  . LASER ABLATION  CONDOLAMATA N/A 02/22/2018   Procedure: LASER ABLATION/REMOVAL OF DXAJOINOMVE AROUND ANUS AND VAGINA;  Surgeon: Michael Boston, MD;  Location: [place];  Service: General;  Laterality: N/A;  . OOPHORECTOMY    . PORTA CATH INSERTION N/A 05/13/2018   Procedure: PORTA CATH INSERTION;  Surgeon: Katha Cabal, MD;  Location: [facility] CV LAB;  Service: Cardiovascular;  Laterality: N/A;  . SMALL INTESTINE SURGERY    . TANDEM RING INSERTION     x3  . THORACOTOMY    . TOTAL KNEE ARTHROPLASTY Left 04/24/2018   Procedure: TOTAL KNEE ARTHROPLASTY;  Surgeon: Lovell Sheehan, MD;  Location: [facility] ORS;  Service: Orthopedics;  Laterality: Left;    SOCIAL HISTORY: Social History   Socioeconomic History  . Marital status: Divorced    Spouse name: Not on file  . Number of children: Not on file  . Years of education: Not on file  . Highest education level: Not on file  Occupational History  . Not on file  Social Needs  . Financial resource strain: Not hard at all  . Food insecurity:    Worry: Never true    Inability: Never true  . Transportation needs:    Medical: No    Non-medical: No  Tobacco Use  . Smoking status: Former Smoker    Last attempt to quit: 10/16/2006    Years since quitting: 12.3  . Smokeless tobacco: Never Used  Substance and Sexual Activity  . Alcohol use: Not Currently    Frequency: Never    Comment: seldom  . Drug use: Yes    Types: Marijuana  . Sexual activity: Not Currently    Birth control/protection: Post-menopausal    Comment: Not Asked  Lifestyle  . Physical activity:    Days per week: Patient refused    Minutes per session: Patient refused  . Stress: Only a little  Relationships  . Social connections:    Talks on phone: Patient refused    Gets together: Patient refused    Attends religious service: Patient refused    Active member of club or organization: Patient refused    Attends meetings of clubs or organizations: Patient refused     Relationship status: Patient refused  . Intimate partner violence:    Fear of current or ex partner: No    Emotionally abused: No    Physically abused: No    Forced sexual activity: No  Other Topics Concern  . Not on file  Social History Narrative  . Not on file    FAMILY HISTORY: Family History  Problem Relation Age of Onset  . Hypertension Father   . Diabetes Father   . Alcohol abuse Daughter   . Hypertension Maternal Grandmother   . Diabetes Maternal Grandmother   . Diabetes Paternal Grandmother   . Hypertension Paternal Grandmother     ALLERGIES:  is allergic to ketamine.  MEDICATIONS:  Current Outpatient Medications  Medication Sig Dispense Refill  . amitriptyline (ELAVIL) 75 MG tablet Take 1 tablet (75 mg total) by mouth at bedtime. 90 tablet 1  . budesonide (ENTOCORT EC) 3 MG 24 hr capsule Take 3 capsules (9 mg total) by mouth daily. 30 capsule 0  .  Calcium Carb-Cholecalciferol (CALCIUM 500 +D) 500-400 MG-UNIT TABS Take 2 tablets by mouth daily.    . diphenoxylate-atropine (LOMOTIL) 2.5-0.025 MG tablet TAKE 2 TABLETS BY MOUTH FOUR TIMES A DAY 240 tablet 0  . famotidine (PEPCID) 20 MG tablet Take 1 tablet (20 mg total) by mouth 2 (two) times daily as needed for heartburn or indigestion.    . hyoscyamine (LEVBID) 0.375 MG 12 hr tablet Take 1 tablet (0.375 mg total)by mouth 2 (two) times daily. 60 tablet 0  . meclizine (ANTIVERT) 25 MG tablet Take 1 tablet (25 mg total) by mouth 2 (two) times daily as needed for dizziness. 15 tablet 0  . Multiple Vitamins-Minerals (MULTIVITAMIN WITH MINERALS) tablet Take 1 tablet by mouth daily. 30 tablet 0  . ondansetron (ZOFRAN ODT) 4 MG disintegrating tablet Take 1 tablet (4 mg total) by mouth every 8 (eight) hours as needed for nausea or vomiting. 20 tablet 0  . oxyCODONE (OXYCONTIN) 15 mg 12 hr tablet Take 1 tablet (15 mg total) by mouth every 12 (twelve) hours. 30 tablet 0  . Oxycodone HCl 10 MG TABS Take 1 tablet (10 mg total) by  mouth every 6 (six) hours as needed. 60 tablet 0  . pantoprazole (PROTONIX) 40 MG tablet Take 1 tablet (40 mg total) by mouth daily. 90 tablet 0  . potassium chloride SA (K-DUR,KLOR-CON) 20 MEQ tablet Take 1 tablet (20 mEq total) by mouth daily. 30 tablet 3  . promethazine (PHENERGAN) 25 MG tablet Take 1 tablet (25 mg total) by mouth every 8 (eight) hours as needed for nausea or vomiting. 30 tablet 3  . rivaroxaban (XARELTO) 20 MG TABS tablet Take 20 mg by mouth daily with supper.    . VENTOLIN HFA 108 (90 Base) MCG/ACT inhaler Inhale 1-2 puffs into the lungs every 4 (four) hours as needed for shortness of breath.   0  . VIBERZI 100 MG TABS TAKE 1 TABLET BY MOUTH TWICE DAILY WITH A MEAL 60 tablet 0   No current facility-administered medications for this visit.    Facility-Administered Medications Ordered in Other Visits  Medication Dose Route Frequency Provider Last Rate Last Dose  . heparin lock flush 100 unit/mL  500 Units Intravenous Once Corcoran, Melissa C, MD      . heparin lock flush 100 unit/mL  500 Units Intravenous Once ,  R, MD      . sodium chloride 0.9 % 1,000 mL with potassium chloride 20 mEq infusion   Intravenous Once Karen Kitchens, NP          .  PHYSICAL EXAMINATION: ECOG PERFORMANCE STATUS: 1 - Symptomatic but completely ambulatory  Vitals:   02/12/19 0840  BP: 101/66  Pulse: 79  Resp: 18  Temp: (!) 97.2 F (36.2 C)   Filed Weights   02/12/19 0840  Weight: 173 lb 9.6 oz (78.7 kg)    Physical Exam  Constitutional: She is oriented to person, place, and time and well-developed, well-nourished, and in no distress.  Alone.  Walking herself.   HENT:  Head: Normocephalic and atraumatic.  Mouth/Throat: Oropharynx is clear and moist. No oropharyngeal exudate.  Eyes: Pupils are equal, round, and reactive to light.  Neck: Normal range of motion. Neck supple.  Cardiovascular: Normal rate and regular rhythm.  Pulmonary/Chest: Effort normal and  breath sounds normal. No respiratory distress. She has no wheezes.  Abdominal: Soft. Bowel sounds are normal. She exhibits no distension and no mass. There is no rebound and no guarding.  Musculoskeletal: Normal  range of motion.        General: No tenderness or edema.  Neurological: She is alert and oriented to person, place, and time.  Skin: Skin is warm.  Psychiatric: Affect normal.     LABORATORY DATA:  I have reviewed the data as listed Lab Results  Component Value Date   WBC 1.9 (L) 02/12/2019   HGB 8.3 (L) 02/12/2019   HCT 24.6 (L) 02/12/2019   MCV 104.7 (H) 02/12/2019   PLT 88 (L) 02/12/2019   Recent Labs    06/11/18 0510  07/08/18 1035  10/21/18 0845  02/05/19 0826 02/10/19 1345 02/12/19 0824  NA 135   < > 136   < > 134*   < > 137 139 136  K 3.9   < > 4.2   < > 3.9   < > 2.9* 4.1 4.0  CL 102   < > 107   < > 98   < > 106 105 106  CO2 27   < > 22   < > 24   < > 27 27 24   GLUCOSE 82   < > 119*   < > 111*   < > 88 97 91  BUN 11   < > 15   < > 17   < > 14 15 12   CREATININE 0.63   < > 0.71   < > 0.91   < > 0.68 0.72 0.69  CALCIUM 8.9   < > 8.7*   < > 9.6   < > 8.1* 8.5* 8.6*  GFRNONAA >60   < > >60   < > >60   < > >60 >60 >60  GFRAA >60   < > >60   < > >60   < > >60 >60 >60  PROT 7.0   < > 6.5   < > 8.6*   < > 6.5 6.8 7.0  ALBUMIN 3.5   < > 3.4*   < > 4.4   < > 3.2* 3.3* 3.5  AST 41   < > 26   < > 48*   < > 26 31 20   ALT 51*   < > 33   < > 67*   < > 32 58* 39  ALKPHOS 171*   < > 132*   < > 185*   < > 155* 145* 129*  BILITOT 0.7   < > 0.4   < > 1.4*   < > 0.2* 0.3 0.3  BILIDIR 0.1  --  <0.1  --  0.3*  --   --   --   --   IBILI 0.6  --  NOT CALCULATED  --   --   --   --   --   --    < > = values in this interval not displayed.    RADIOGRAPHIC STUDIES: I have personally reviewed the radiological images as listed and agreed with the findings in the report. Ct Head Wo Contrast  Result Date: 01/26/2019 CLINICAL DATA:  Altered level of consciousness. EXAM: CT HEAD  WITHOUT CONTRAST TECHNIQUE: Contiguous axial images were obtained from the base of the skull through the vertex without intravenous contrast. COMPARISON:  None. FINDINGS: Brain: No evidence of acute infarction, hemorrhage, hydrocephalus, extra-axial collection or mass lesion/mass effect. Vascular: No hyperdense vessel or unexpected calcification. Skull: Normal. Negative for fracture or focal lesion. Sinuses/Orbits: No acute finding. Other: None. IMPRESSION: Normal head CT. Electronically Signed   By: Sabino Dick  Brooke Bonito, M.D.   On: 01/26/2019 22:19   Ct Abdomen Pelvis W Contrast  Result Date: 01/23/2019 CLINICAL DATA:  Severe pelvic pain with nausea.  Cervical cancer. EXAM: CT ABDOMEN AND PELVIS WITH CONTRAST TECHNIQUE: Multidetector CT imaging of the abdomen and pelvis was performed using the standard protocol following bolus administration of intravenous contrast. CONTRAST:  23m OMNIPAQUE IOHEXOL 300 MG/ML  SOLN COMPARISON:  12/16/2018 FINDINGS: Lower chest: Multiple pulmonary metastases again noted without substantial interval change. 8 mm index lesion posterior left lower lobe (11/3) is stable since prior. Hepatobiliary: No suspicious focal abnormality within the liver parenchyma. Trace prominence intrahepatic bile ducts with extrahepatic common duct measuring 14 mm diameter, stable since prior study. Gallbladder surgically absent. Pancreas: No focal mass lesion. No dilatation of the main duct. No intraparenchymal cyst. No peripancreatic edema. Spleen: No splenomegaly. No focal mass lesion. Adrenals/Urinary Tract: No adrenal nodule or mass. 1 mm nonobstructing stone identified lower pole right kidney (34/2). Left kidney unremarkable. No evidence for hydroureter. The urinary bladder appears normal for the degree of distention. Stomach/Bowel: Stomach is unremarkable. No gastric wall thickening. No evidence of outlet obstruction. Duodenum is normally positioned as is the ligament of Treitz. No small bowel wall  thickening. No small bowel dilatation. Status post right hemicolectomy. Wall thickening in the sigmoid colon and rectum is similar to prior. Vascular/Lymphatic: There is abdominal aortic atherosclerosis without aneurysm. 12 mm short axis hepato duodenal ligament lymph node (19/2) is stable. Other upper normal to borderline enlarged hepato duodenal ligament lymph nodes are similar. No para-aortic lymphadenopathy by size criteria. No pelvic sidewall lymphadenopathy. Reproductive: The uterus is unremarkable.  There is no adnexal mass. Other: No intraperitoneal free fluid. Musculoskeletal: Presacral and perirectal edema is similar to prior. Similar appearance diffusely heterogeneous bone mineralization. Lumbar scoliosis with advanced degenerative disc disease. Tiny gas locules to the right of the L4-5 interspace are compatible with degenerative change. IMPRESSION: 1. No substantial interval change in exam. No findings to explain the patient's history of pelvic pain. 2. Similar appearance of pulmonary metastases identified in the lung bases. 3. Stable extrahepatic biliary duct dilatation in this patient status post cholecystectomy. 4. Wall thickening in the sigmoid colon and rectum is similar to prior. Perirectal and presacral edema also not substantially changed. Features likely reflect sequelae of radiation therapy. Electronically Signed   By: EMisty StanleyM.D.   On: 01/23/2019 14:01   [place]ous Img Lower Unilateral Left  Result Date: 01/22/2019 CLINICAL DATA:  Left leg swelling for 1 week EXAM: LEFT LOWER EXTREMITY VENOUS DOPPLER ULTRASOUND TECHNIQUE: Gray-scale sonography with graded compression, as well as color Doppler and duplex ultrasound were performed to evaluate the lower extremity deep venous systems from the level of the common femoral vein and including the common femoral, femoral, profunda femoral, popliteal and calf veins including the posterior tibial, peroneal and gastrocnemius veins when visible.  The superficial great saphenous vein was also interrogated. Spectral Doppler was utilized to evaluate flow at rest and with distal augmentation maneuvers in the common femoral, femoral and popliteal veins. COMPARISON:  None. FINDINGS: Contralateral Common Femoral Vein: Respiratory phasicity is normal and symmetric with the symptomatic side. No evidence of thrombus. Normal compressibility. Common Femoral Vein: No evidence of thrombus. Normal compressibility, respiratory phasicity and response to augmentation. Saphenofemoral Junction: No evidence of thrombus. Normal compressibility and flow on color Doppler imaging. Profunda Femoral Vein: No evidence of thrombus. Normal compressibility and flow on color Doppler imaging. Femoral Vein: No evidence of thrombus. Normal compressibility, respiratory phasicity  and response to augmentation. Popliteal Vein: No evidence of thrombus. Normal compressibility, respiratory phasicity and response to augmentation. Calf Veins: No evidence of thrombus. Normal compressibility and flow on color Doppler imaging. Superficial Great Saphenous Vein: No evidence of thrombus. Normal compressibility. Venous Reflux:  None. Other Findings:  None. IMPRESSION: No evidence of deep venous thrombosis. These results will be called to the ordering clinician or representative by the Radiology Department at the imaging location. Electronically Signed   By: Inez Catalina M.D.   On: 01/22/2019 16:21   Dg Chest Portable 1 View  Result Date: 01/26/2019 CLINICAL DATA:  Current history of metastatic ovarian cancer, presenting with acute cough, generalized weakness and nausea. EXAM: PORTABLE CHEST 1 VIEW COMPARISON:  12/25/2018 and earlier, including CT chest 09/30/2018. FINDINGS: Cardiac silhouette normal in size, unchanged. Thoracic aorta mildly atherosclerotic, unchanged. Hilar and mediastinal contours otherwise unremarkable. The multiple small nodules throughout both lungs identified on the prior CT are  inconspicuous on the portable x-ray. No new or suspicious findings in either lung. No pleural effusions. RIGHT jugular Port-A-Cath tip projects over the mid SVC, unchanged. IMPRESSION: 1. No acute cardiopulmonary disease. 2. The multiple small nodules throughout both lungs identified on the prior CT are inconspicuous on the portable x-ray. Electronically Signed   By: Evangeline Dakin M.D.   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  CT abdomen pelvis-April 2020 -no evidence of  progressive disease in the abdomen pelvis stable bowel thickening; lung nodule - stable. Currently on single agent taxol.  # proceed with single agent Taxol; Labs today reviewed;  acceptable for treatment today- except ANC- 1.2/ and platelets-88. Will dose reduce the dose to 25m/m2 weekly.   #Hypomagnesemia-secondary platinum chemotherapy-mag 1.3 proceed with 4 g magnesium today. Stable.   #Chronic right lower quadrant abdominal pain/scar tissue versus others- stable. continue oxycodone.  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  Stable.   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   # adrenal insuffiency- on maintenance steroids-stable.   # DISPOSITION: # Taxol/ Mag 4 gm/ 2h today. # 1 week- bmp/mag-Mag 4 gm/2h possible IVFs over 1 hour # follow up in 2 week-MD cbc/cmpmag/ taxol;Mag infusion 2 hours-Dr.B     All questions were answered. The patient knows to call the clinic with any problems, questions or concerns.     GCammie Sickle MD 02/12/2019 9:03 AM

## 2019-02-12 NOTE — Assessment & Plan Note (Addendum)
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  CT abdomen pelvis-April 2020 -no evidence of  progressive disease in the abdomen pelvis stable bowel thickening; lung nodule - stable. Currently on single agent taxol.  # proceed with single agent Taxol; Labs today reviewed;  acceptable for treatment today- except ANC- 1.2/ and platelets-88. Will dose reduce the dose to 11m/m2 weekly.   #Hypomagnesemia-secondary platinum chemotherapy-mag 1.3 proceed with 4 g magnesium today. Stable.   #Chronic right lower quadrant abdominal pain/scar tissue versus others- stable. continue oxycodone.  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  Stable.   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   # adrenal insuffiency- on maintenance steroids-stable.   # DISPOSITION: # Taxol/ Mag 4 gm/ 2h today. # 1 week- bmp/mag-Mag 4 gm/2h possible IVFs over 1 hour # follow up in 2 week-MD cbc/cmpmag/ taxol;Mag infusion 2 hours-Dr.B

## 2019-02-12 NOTE — Progress Notes (Signed)
Labs within MD treatment parameters. Per Dr. Rogue Bussing pt to receive 4 grams Magnesium and treatment.

## 2019-02-13 ENCOUNTER — Encounter: Payer: Self-pay | Admitting: Oncology

## 2019-02-13 ENCOUNTER — Ambulatory Visit: Payer: Medicaid Other

## 2019-02-14 ENCOUNTER — Inpatient Hospital Stay: Payer: Medicaid Other | Admitting: Hospice and Palliative Medicine

## 2019-02-17 ENCOUNTER — Ambulatory Visit: Payer: Medicaid Other | Admitting: Endocrinology

## 2019-02-17 ENCOUNTER — Inpatient Hospital Stay: Payer: Medicaid Other

## 2019-02-17 ENCOUNTER — Inpatient Hospital Stay (HOSPITAL_BASED_OUTPATIENT_CLINIC_OR_DEPARTMENT_OTHER): Payer: Medicaid Other | Admitting: Hospice and Palliative Medicine

## 2019-02-17 ENCOUNTER — Telehealth: Payer: Self-pay | Admitting: *Deleted

## 2019-02-17 ENCOUNTER — Inpatient Hospital Stay: Payer: Medicaid Other | Attending: Hospice and Palliative Medicine

## 2019-02-17 ENCOUNTER — Other Ambulatory Visit: Payer: Self-pay

## 2019-02-17 VITALS — BP 112/73 | HR 101 | Temp 97.2°F | Resp 18 | Ht 69.0 in | Wt 173.4 lb

## 2019-02-17 VITALS — BP 101/66 | HR 76

## 2019-02-17 DIAGNOSIS — Z95828 Presence of other vascular implants and grafts: Secondary | ICD-10-CM

## 2019-02-17 DIAGNOSIS — R531 Weakness: Secondary | ICD-10-CM

## 2019-02-17 DIAGNOSIS — E876 Hypokalemia: Secondary | ICD-10-CM | POA: Diagnosis not present

## 2019-02-17 DIAGNOSIS — C538 Malignant neoplasm of overlapping sites of cervix uteri: Secondary | ICD-10-CM | POA: Insufficient documentation

## 2019-02-17 DIAGNOSIS — E860 Dehydration: Secondary | ICD-10-CM | POA: Diagnosis not present

## 2019-02-17 DIAGNOSIS — C7800 Secondary malignant neoplasm of unspecified lung: Secondary | ICD-10-CM

## 2019-02-17 DIAGNOSIS — Z79899 Other long term (current) drug therapy: Secondary | ICD-10-CM | POA: Diagnosis not present

## 2019-02-17 DIAGNOSIS — R197 Diarrhea, unspecified: Secondary | ICD-10-CM | POA: Insufficient documentation

## 2019-02-17 DIAGNOSIS — K921 Melena: Secondary | ICD-10-CM | POA: Insufficient documentation

## 2019-02-17 DIAGNOSIS — Z5111 Encounter for antineoplastic chemotherapy: Secondary | ICD-10-CM | POA: Insufficient documentation

## 2019-02-17 DIAGNOSIS — Z923 Personal history of irradiation: Secondary | ICD-10-CM | POA: Diagnosis not present

## 2019-02-17 DIAGNOSIS — C530 Malignant neoplasm of endocervix: Secondary | ICD-10-CM

## 2019-02-17 LAB — CBC WITH DIFFERENTIAL/PLATELET
Abs Immature Granulocytes: 0.02 10*3/uL (ref 0.00–0.07)
Basophils Absolute: 0 10*3/uL (ref 0.0–0.1)
Basophils Relative: 1 %
Eosinophils Absolute: 0 10*3/uL (ref 0.0–0.5)
Eosinophils Relative: 1 %
HCT: 27.3 % — ABNORMAL LOW (ref 36.0–46.0)
Hemoglobin: 9.2 g/dL — ABNORMAL LOW (ref 12.0–15.0)
Immature Granulocytes: 1 %
Lymphocytes Relative: 21 %
Lymphs Abs: 0.4 10*3/uL — ABNORMAL LOW (ref 0.7–4.0)
MCH: 35.4 pg — ABNORMAL HIGH (ref 26.0–34.0)
MCHC: 33.7 g/dL (ref 30.0–36.0)
MCV: 105 fL — ABNORMAL HIGH (ref 80.0–100.0)
Monocytes Absolute: 0.1 10*3/uL (ref 0.1–1.0)
Monocytes Relative: 6 %
Neutro Abs: 1.2 10*3/uL — ABNORMAL LOW (ref 1.7–7.7)
Neutrophils Relative %: 70 %
Platelets: 94 10*3/uL — ABNORMAL LOW (ref 150–400)
RBC: 2.6 MIL/uL — ABNORMAL LOW (ref 3.87–5.11)
RDW: 14.7 % (ref 11.5–15.5)
WBC: 1.7 10*3/uL — ABNORMAL LOW (ref 4.0–10.5)
nRBC: 0 % (ref 0.0–0.2)

## 2019-02-17 LAB — COMPREHENSIVE METABOLIC PANEL
ALT: 62 U/L — ABNORMAL HIGH (ref 0–44)
AST: 43 U/L — ABNORMAL HIGH (ref 15–41)
Albumin: 3.7 g/dL (ref 3.5–5.0)
Alkaline Phosphatase: 136 U/L — ABNORMAL HIGH (ref 38–126)
Anion gap: 7 (ref 5–15)
BUN: 13 mg/dL (ref 6–20)
CO2: 26 mmol/L (ref 22–32)
Calcium: 9.2 mg/dL (ref 8.9–10.3)
Chloride: 102 mmol/L (ref 98–111)
Creatinine, Ser: 0.67 mg/dL (ref 0.44–1.00)
GFR calc Af Amer: >60 mL/min (ref >60)
GFR calc non Af Amer: >60 mL/min (ref >60)
Glucose, Bld: 94 mg/dL (ref 70–99)
Potassium: 4.2 mmol/L (ref 3.5–5.1)
Sodium: 135 mmol/L (ref 135–145)
Total Bilirubin: 0.6 mg/dL (ref 0.3–1.2)
Total Protein: 7.4 g/dL (ref 6.5–8.1)

## 2019-02-17 LAB — MAGNESIUM: Magnesium: 1.8 mg/dL (ref 1.7–2.4)

## 2019-02-17 MED ORDER — OXYCODONE HCL 5 MG PO TABS
10.0000 mg | ORAL_TABLET | Freq: Once | ORAL | Status: AC
  Administered 2019-02-17: 12:00:00 10 mg via ORAL
  Filled 2019-02-17: qty 2

## 2019-02-17 MED ORDER — SODIUM CHLORIDE 0.9 % IV SOLN
INTRAVENOUS | Status: DC
  Administered 2019-02-17: 11:00:00 via INTRAVENOUS
  Filled 2019-02-17 (×2): qty 250

## 2019-02-17 MED ORDER — SODIUM CHLORIDE 0.9% FLUSH
10.0000 mL | Freq: Once | INTRAVENOUS | Status: AC
  Administered 2019-02-17: 11:00:00 10 mL
  Filled 2019-02-17: qty 10

## 2019-02-17 MED ORDER — SODIUM CHLORIDE 0.9% FLUSH
10.0000 mL | Freq: Once | INTRAVENOUS | Status: AC
  Filled 2019-02-17: qty 10

## 2019-02-17 MED ORDER — HEPARIN SOD (PORK) LOCK FLUSH 100 UNIT/ML IV SOLN
500.0000 [IU] | Freq: Once | INTRAVENOUS | Status: AC
  Administered 2019-02-17: 13:00:00 500 [IU]
  Filled 2019-02-17: qty 5

## 2019-02-17 NOTE — Progress Notes (Deleted)
Patient ID: Joanna Hall, female   DOB: 09-May-1966, 53 y.o.   MRN: [id number]             Chief complaint: Adrenal insufficiency   History of Present Illness:   The patient was diagnosed to have adrenal insufficiency when     Has symptoms of lightheadedness, new weakness, nausea, weight loss, decreased appetite or known electrolyte disturbance  Patient was evaluated with cortisol level  Subsequently Cortrosyn stimulation showed peak level of 17 ACTH level    Currently has change in appetite, nausea, lightheadedness.    Past Medical History:  Diagnosis Date  . Abdominal pain 06/10/2018  . Abnormal cervical Papanicolaou smear 09/18/2017  . Anxiety   . Aortic atherosclerosis ([place])   . Arthritis    neck and knees  . 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 Blood transfusion without reported diagnosis   . Cervical cancer ([place]) 09/2016   mets lung  . Chronic anal fissure   . Chronic diarrhea   . Dyspnea   . Erosive gastropathy 09/18/2017  . Factor V Leiden mutation ([place])   . Fecal incontinence   . Genital warts   . GERD (gastroesophageal reflux disease)   . Heart murmur   . Hematochezia   . Hemorrhoids   . 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 Hepatitis, chronic ([place]) 05/05/2017  . History of cancer chemotherapy    completed 06/2017  . History of Clostridium difficile infection    while undergoing chemo.  Negative test 10/2017  . Ileocolic anastomotic leak   . 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 Intestinal infection due to Clostridium difficile 09/18/2017  . Macrocytic anemia with vitamin B12 deficiency   . Multiple gastric ulcers   . Nausea vomiting and diarrhea   . Pancolitis ([place]) 07/27/2018  . Perianal condylomata   . Pneumonia    History of  . Pulmonary nodules   . Rectal bleeding   . Small bowel obstruction ([place]) 08/2017  . Stiff neck    limited right turn  . Vitamin D deficiency      Past Surgical History:  Procedure Laterality Date  . CHOLECYSTECTOMY    . COLON SURGERY  08/2017   resection  . COLONOSCOPY WITH PROPOFOL N/A 12/20/2017   Procedure: COLONOSCOPY WITH PROPOFOL;  Surgeon: [name], MD;  Location: [facility] ENDOSCOPY;  Service: Gastroenterology;  Laterality: N/A;  . COLONOSCOPY WITH PROPOFOL N/A 07/30/2018   Procedure: COLONOSCOPY WITH PROPOFOL;  Surgeon: [name], MD;  Location: [facility] ENDOSCOPY;  Service: Gastroenterology;  Laterality: N/A;  . COLONOSCOPY WITH PROPOFOL N/A 10/10/2018   Procedure: COLONOSCOPY WITH PROPOFOL;  Surgeon: Lucilla Lame, MD;  Location: [facility] ENDOSCOPY;  Service: Endoscopy;  Laterality: N/A;  . DIAGNOSTIC LAPAROSCOPY    . ESOPHAGOGASTRODUODENOSCOPY (EGD) WITH PROPOFOL N/A 12/20/2017   Procedure: ESOPHAGOGASTRODUODENOSCOPY (EGD) WITH PROPOFOL;  Surgeon: [name], MD;  Location: [place];  Service: Gastroenterology;  Laterality: N/A;  . ESOPHAGOGASTRODUODENOSCOPY (EGD) WITH PROPOFOL  07/30/2018   Procedure: ESOPHAGOGASTRODUODENOSCOPY (EGD) WITH PROPOFOL;  Surgeon: [name], MD;  Location: [facility] ENDOSCOPY;  Service: Gastroenterology;;  . Otho Darner SIGMOIDOSCOPY N/A 11/21/2018   Procedure: FLEXIBLE SIGMOIDOSCOPY;  Surgeon: [name], MD;  Location: [facility] ENDOSCOPY;  Service: Gastroenterology;  Laterality: N/A;  . LAPAROTOMY N/A 08/31/2017   Procedure: EXPLORATORY LAPAROTOMY for SBO, ileocolectomy, removal of piece of uterine wall;  Surgeon:  [name], MD;  Location: [facility] ORS;  Service: General;  Laterality: N/A;  . LASER ABLATION CONDOLAMATA N/A 02/22/2018   Procedure: LASER ABLATION/REMOVAL OF CONDOLAMATA AROUND ANUS AND VAGINA;  Surgeon: Michael Boston, MD;  Location: [place];  Service: General;  Laterality: N/A;  . OOPHORECTOMY    . PORTA CATH INSERTION N/A 05/13/2018   Procedure: PORTA CATH INSERTION;  Surgeon: Katha Cabal, MD;  Location: [place] CV LAB;   Service: Cardiovascular;  Laterality: N/A;  . SMALL INTESTINE SURGERY    . TANDEM RING INSERTION     x3  . THORACOTOMY    . TOTAL KNEE ARTHROPLASTY Left 04/24/2018   Procedure: TOTAL KNEE ARTHROPLASTY;  Surgeon: Lovell Sheehan, MD;  Location: [facility] ORS;  Service: Orthopedics;  Laterality: Left;    Family History  Problem Relation Age of Onset  . Hypertension Father   . Diabetes Father   . Alcohol abuse Daughter   . Hypertension Maternal Grandmother   . Diabetes Maternal Grandmother   . Diabetes Paternal Grandmother   . Hypertension Paternal Grandmother     Social History:  reports that she quit smoking about 12 years ago. She has never used smokeless tobacco. She reports previous alcohol use. She reports current drug use. Drug: Marijuana.  Allergies:  Allergies  Allergen Reactions  . Ketamine Anxiety and Other (See Comments)    Syncope episode/confusion     Allergies as of 02/17/2019      Reactions   Ketamine Anxiety, Other (See Comments)   Syncope episode/confusion      Medication List       Accurate as of Feb 17, 2019 11:47 AM. Always use your most recent med list.        amitriptyline 75 MG tablet Commonly known as:  ELAVIL Take 1 tablet (75 mg total) by mouth at bedtime.   budesonide 3 MG 24 hr capsule Commonly known as:  ENTOCORT EC Take 3 capsules (9 mg total) by mouth daily.   Calcium 500 +D 500-400 MG-UNIT Tabs Generic drug:  Calcium Carb-Cholecalciferol Take 2 tablets by mouth daily.   diphenoxylate-atropine 2.5-0.025 MG tablet Commonly known as:  LOMOTIL TAKE 2 TABLETS BY MOUTH FOUR TIMES A DAY   famotidine 20 MG tablet Commonly known as:  PEPCID Take 1 tablet (20 mg total) by mouth 2 (two) times daily as needed for heartburn or indigestion.   hyoscyamine 0.375 MG 12 hr tablet Commonly known as:  LEVBID Take 1 tablet (0.375 mg total)by mouth 2 (two) times daily.   meclizine 25 MG tablet Commonly known as:  ANTIVERT Take 1 tablet (25 mg total)  by mouth 2 (two) times daily as needed for dizziness.   multivitamin with minerals tablet Take 1 tablet by mouth daily.   ondansetron 4 MG disintegrating tablet Commonly known as:  Zofran ODT Take 1 tablet (4 mg total) by mouth every 8 (eight) hours as needed for nausea or vomiting.   oxyCODONE 15 mg 12 hr tablet Commonly known as:  OXYCONTIN Take 1 tablet (15 mg total) by mouth every 12 (twelve) hours.   Oxycodone HCl 10 MG Tabs Take 1 tablet (10 mg total) by mouth every 6 (six) hours as needed.   pantoprazole 40 MG tablet Commonly known as:  PROTONIX Take 1 tablet (40 mg total) by mouth daily.   potassium chloride SA 20 MEQ tablet Commonly known as:  K-DUR Take 1 tablet (20 mEq total) by mouth daily.   promethazine 25 MG tablet Commonly  known as:  PHENERGAN Take 1 tablet (25 mg total) by mouth every 8 (eight) hours as needed for nausea or vomiting.   rivaroxaban 20 MG Tabs tablet Commonly known as:  XARELTO Take 20 mg by mouth daily with supper.   Ventolin HFA 108 (90 Base) MCG/ACT inhaler Generic drug:  albuterol Inhale 1-2 puffs into the lungs every 4 (four) hours as needed for shortness of breath.   Viberzi 100 MG Tabs Generic drug:  Eluxadoline TAKE 1 TABLET BY MOUTH TWICE DAILY WITH A MEAL       LABS:  Infusion on 02/17/2019  Component Date Value Ref Range Status  . WBC 02/17/2019 1.7* 4.0 - 10.5 K/uL Final  . RBC 02/17/2019 2.60* 3.87 - 5.11 MIL/uL Final  . Hemoglobin 02/17/2019 9.2* 12.0 - 15.0 g/dL Final  . HCT 02/17/2019 27.3* 36.0 - 46.0 % Final  . MCV 02/17/2019 105.0* 80.0 - 100.0 fL Final  . MCH 02/17/2019 35.4* 26.0 - 34.0 pg Final  . MCHC 02/17/2019 33.7  30.0 - 36.0 g/dL Final  . RDW 02/17/2019 14.7  11.5 - 15.5 % Final  . Platelets 02/17/2019 94* 150 - 400 K/uL Final  . nRBC 02/17/2019 0.0  0.0 - 0.2 % Final  . Neutrophils Relative % 02/17/2019 70  % Final  . Neutro Abs 02/17/2019 1.2* 1.7 - 7.7 K/uL Final  . Lymphocytes Relative  02/17/2019 21  % Final  . Lymphs Abs 02/17/2019 0.4* 0.7 - 4.0 K/uL Final  . Monocytes Relative 02/17/2019 6  % Final  . Monocytes Absolute 02/17/2019 0.1  0.1 - 1.0 K/uL Final  . Eosinophils Relative 02/17/2019 1  % Final  . Eosinophils Absolute 02/17/2019 0.0  0.0 - 0.5 K/uL Final  . Basophils Relative 02/17/2019 1  % Final  . Basophils Absolute 02/17/2019 0.0  0.0 - 0.1 K/uL Final  . Immature Granulocytes 02/17/2019 1  % Final  . Abs Immature Granulocytes 02/17/2019 0.02  0.00 - 0.07 K/uL Final   Performed at [facility], [address], [place], [place] [address]  . Sodium 02/17/2019 135  135 - 145 mmol/L Final  . Potassium 02/17/2019 4.2  3.5 - 5.1 mmol/L Final  . Chloride 02/17/2019 102  98 - 111 mmol/L Final  . CO2 02/17/2019 26  22 - 32 mmol/L Final  . Glucose, Bld 02/17/2019 94  70 - 99 mg/dL Final  . BUN 02/17/2019 13  6 - 20 mg/dL Final  . Creatinine, Ser 02/17/2019 0.67  0.44 - 1.00 mg/dL Final  . Calcium 02/17/2019 9.2  8.9 - 10.3 mg/dL Final  . Total Protein 02/17/2019 7.4  6.5 - 8.1 g/dL Final  . Albumin 02/17/2019 3.7  3.5 - 5.0 g/dL Final  . AST 02/17/2019 43* 15 - 41 U/L Final  . ALT 02/17/2019 62* 0 - 44 U/L Final  . Alkaline Phosphatase 02/17/2019 136* 38 - 126 U/L Final  . Total Bilirubin 02/17/2019 0.6  0.3 - 1.2 mg/dL Final  . GFR calc non Af Amer 02/17/2019 >60  >60 mL/min Final  . GFR calc Af Amer 02/17/2019 >60  >60 mL/min Final  . Anion gap 02/17/2019 7  5 - 15 Final   Performed at [facility], [address], [place], [place] [address]  . Magnesium 02/17/2019 1.8  1.7 - 2.4 mg/dL Final   Performed at [facility], [address] [place], [place] [address]  Infusion on 02/12/2019  Component Date Value Ref Range Status  . WBC 02/12/2019 1.9*  4.0 - 10.5 K/uL Final  . RBC 02/12/2019 2.35* 3.87 - 5.11 MIL/uL Final  . Hemoglobin 02/12/2019 8.3* 12.0 - 15.0 g/dL Final  . HCT 02/12/2019 24.6* 36.0 - 46.0 % Final  . MCV 02/12/2019 104.7*  80.0 - 100.0 fL Final  . MCH 02/12/2019 35.3* 26.0 - 34.0 pg Final  . MCHC 02/12/2019 33.7  30.0 - 36.0 g/dL Final  . RDW 02/12/2019 14.3  11.5 - 15.5 % Final  . Platelets 02/12/2019 88* 150 - 400 K/uL Final  . nRBC 02/12/2019 0.0  0.0 - 0.2 % Final  . Neutrophils Relative % 02/12/2019 58  % Final  . Neutro Abs 02/12/2019 1.2* 1.7 - 7.7 K/uL Final  . Lymphocytes Relative 02/12/2019 33  % Final  . Lymphs Abs 02/12/2019 0.6* 0.7 - 4.0 K/uL Final  . Monocytes Relative 02/12/2019 6  % Final  . Monocytes Absolute 02/12/2019 0.1  0.1 - 1.0 K/uL Final  . Eosinophils Relative 02/12/2019 1  % Final  . Eosinophils Absolute 02/12/2019 0.0  0.0 - 0.5 K/uL Final  . Basophils Relative 02/12/2019 1  % Final  . Basophils Absolute 02/12/2019 0.0  0.0 - 0.1 K/uL Final  . Immature Granulocytes 02/12/2019 1  % Final  . Abs Immature Granulocytes 02/12/2019 0.01  0.00 - 0.07 K/uL Final   Performed at [facility], [address] [place], [place] [address]  . Sodium 02/12/2019 136  135 - 145 mmol/L Final  . Potassium 02/12/2019 4.0  3.5 - 5.1 mmol/L Final  . Chloride 02/12/2019 106  98 - 111 mmol/L Final  . CO2 02/12/2019 24  22 - 32 mmol/L Final  . Glucose, Bld 02/12/2019 91  70 - 99 mg/dL Final  . BUN 02/12/2019 12  6 - 20 mg/dL Final  . Creatinine, Ser 02/12/2019 0.69  0.44 - 1.00 mg/dL Final  . Calcium 02/12/2019 8.6* 8.9 - 10.3 mg/dL Final  . Total Protein 02/12/2019 7.0  6.5 - 8.1 g/dL Final  . Albumin 02/12/2019 3.5  3.5 - 5.0 g/dL Final  . AST 02/12/2019 20  15 - 41 U/L Final  . ALT 02/12/2019 39  0 - 44 U/L Final  . Alkaline Phosphatase 02/12/2019 129* 38 - 126 U/L Final  . Total Bilirubin 02/12/2019 0.3  0.3 - 1.2 mg/dL Final  . GFR calc non Af Amer 02/12/2019 >60  >60 mL/min Final  . GFR calc Af Amer 02/12/2019 >60  >60 mL/min Final  . Anion gap 02/12/2019 6  5 - 15 Final   Performed at [facility], [address], [place], [place] [address]  . Magnesium 02/12/2019 1.3* 1.7 -  2.4 mg/dL Final   Performed at [facility], [address] [place], [place] [address]     REVIEW OF SYSTEMS:        Review of Systems   PHYSICAL EXAM:  There were no vitals taken for this visit.  GENERAL:  No pallor, clubbing, lymphadenopathy or edema.  Skin:  no rash or pigmentation.  EYES:  Externally normal.  Fundii:  normal discs and vessels.  ENT: Oral mucosa and tongue normal.  THYROID:  Not palpable.  HEART:  Normal  S1 and S2; no murmur or click.  CHEST:  Normal shape.  Lungs: Vescicular breath sounds heard equally.  No crepitations/ wheeze.  ABDOMEN:  No distention.  Liver and spleen not palpable.  No other mass or tenderness.  NEUROLOGICAL: .Reflexes are bilaterally at ankles.  JOINTS:  Normal.   ASSESSMENT:  PLAN:      Elayne Snare 02/17/2019, 11:47 AM

## 2019-02-17 NOTE — Progress Notes (Signed)
[place]  Telephone:([phone] [phone]   Name: Joanna Hall Date: 02/17/2019 MRN: [id number]  DOB: 1965/11/18  Patient Care Team: Arnetha Courser, MD as PCP - General (Family Medicine) Mellody Drown, MD as Consulting Physician (Obstetrics and Gynecology) Lenor Coffin, MD as Attending Physician (Gynecology) Lequita Asal, MD as Medical Oncologist (Hematology and Oncology) [name], MD as Consulting Physician (Gastroenterology) Michael Boston, MD as Consulting Physician (General Surgery) Lovell Sheehan, MD as Consulting Physician (Orthopedic Surgery) Clent Jacks, RN as Registered Nurse    REASON FOR CONSULTATION: Palliative Care consult requested for this 53 y.o. 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  PMH also notable for history of right hemicolectomy with chronic inflammatory changes to the colon.  Patient is followed by GI.  She was recently hospitalized 11/19/2020 11/25/2018 with GI bleed.  Patient underwent flex sig on 2/6 but was mostly unremarkable except edematous mucosa.  Patient has chronic abdominal pain.  She presented to the ER on 12/16/2018 with acute on chronic intractable abdominal pain.  Patient had CT angio of the abdomen/pelvis, which revealed mucosal enhancement and wall thickening in the rectum and sigmoid colon but was essentially unchanged from previous studies.  Palliative care was consulted to help address symptoms and goals.  SOCIAL HISTORY:    Patient is not married.  She lives at home with her mother and father.  She has an adult daughter who lives in [place], [place].  She had another daughter who is now deceased.  ADVANCE DIRECTIVES:  Not on file   CODE STATUS:   PAST MEDICAL HISTORY: Past Medical History:  Diagnosis Date   Abdominal pain  06/10/2018   Abnormal cervical Papanicolaou smear 09/18/2017   Anxiety    Aortic atherosclerosis (HCC)    Arthritis    neck and knees   Blood clots in brain    both lungs and right kidney   Blood transfusion without reported diagnosis    Cervical cancer ([place]) 09/2016   mets lung   Chronic anal fissure    Chronic diarrhea    Dyspnea    Erosive gastropathy 09/18/2017   Factor V Leiden mutation ([place])    Fecal incontinence    Genital warts    GERD (gastroesophageal reflux disease)    Heart murmur    Hematochezia    Hemorrhoids    Hepatitis C    Chronic, 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   Intestinal infection due to Clostridium difficile 09/18/2017   Macrocytic anemia with vitamin B12 deficiency    Multiple gastric ulcers    Nausea vomiting and diarrhea    Pancolitis ([place]) 07/27/2018   Perianal condylomata    Pneumonia    History of   Pulmonary nodules    Rectal bleeding    Small bowel obstruction ([place]) 08/2017   Stiff neck    limited right turn   Vitamin D deficiency     PAST SURGICAL HISTORY:  Past Surgical History:  Procedure Laterality Date   CHOLECYSTECTOMY     COLON SURGERY  08/2017   resection   COLONOSCOPY WITH PROPOFOL N/A 12/20/2017   Procedure: COLONOSCOPY WITH PROPOFOL;  Surgeon: [name], MD;  Location: [facility] ENDOSCOPY;  Service: Gastroenterology;  Laterality: N/A;   COLONOSCOPY WITH PROPOFOL N/A 07/30/2018   Procedure: COLONOSCOPY WITH PROPOFOL;  Surgeon: [name], MD;  Location: [facility] ENDOSCOPY;  Service: Gastroenterology;  Laterality: N/A;   COLONOSCOPY WITH PROPOFOL N/A 10/10/2018   Procedure: COLONOSCOPY WITH PROPOFOL;   Surgeon: Lucilla Lame, MD;  Location: [facility] ENDOSCOPY;  Service: Endoscopy;  Laterality: N/A;   DIAGNOSTIC LAPAROSCOPY     ESOPHAGOGASTRODUODENOSCOPY (EGD) WITH PROPOFOL N/A 12/20/2017   Procedure: ESOPHAGOGASTRODUODENOSCOPY (EGD) WITH PROPOFOL;  Surgeon: [name], MD;  Location: [facility] ENDOSCOPY;  Service: Gastroenterology;  Laterality: N/A;   ESOPHAGOGASTRODUODENOSCOPY (EGD) WITH PROPOFOL  07/30/2018   Procedure: ESOPHAGOGASTRODUODENOSCOPY (EGD) WITH PROPOFOL;  Surgeon: [name], MD;  Location: [facility] ENDOSCOPY;  Service: Gastroenterology;;   [place] N/A 11/21/2018   Procedure: FLEXIBLE SIGMOIDOSCOPY;  Surgeon: [name], MD;  Location: [facility] ENDOSCOPY;  Service: Gastroenterology;  Laterality: N/A;   LAPAROTOMY N/A 08/31/2017   Procedure: EXPLORATORY LAPAROTOMY for SBO, ileocolectomy, removal of piece of uterine wall;  Surgeon: [name], MD;  Location: [facility] ORS;  Service: General;  Laterality: N/A;   LASER ABLATION CONDOLAMATA N/A 02/22/2018   Procedure: LASER ABLATION/REMOVAL OF [id number] AROUND ANUS AND VAGINA;  Surgeon: Michael Boston, MD;  Location: [facility];  Service: General;  Laterality: N/A;   OOPHORECTOMY     PORTA CATH INSERTION N/A 05/13/2018   Procedure: PORTA CATH INSERTION;  Surgeon: Katha Cabal, MD;  Location: [facility] CV LAB;  Service: Cardiovascular;  Laterality: N/A;   SMALL INTESTINE SURGERY     TANDEM RING INSERTION     x3   THORACOTOMY     TOTAL KNEE ARTHROPLASTY Left 04/24/2018   Procedure: TOTAL KNEE ARTHROPLASTY;  Surgeon: Lovell Sheehan, MD;  Location: [facility] ORS;  Service: Orthopedics;  Laterality: Left;    HEMATOLOGY/ONCOLOGY HISTORY:  Oncology History   # dec 2017- CERVICAL ADENO CA [[place]]; Stage IB [Dr. Christene Slates at [facility] in [place], Learned;No surgery- Chemo-RT;  # 2018-sep - lung nodules [in [place]]  # AUG 20th, 2019-#1 Carbo-Taxol  s/p 6 cycles- [No avastin sec to blood clots]: Feb 2020-bilateral lung nodules with 1 cm in size. March 2020- finished carbo-taxol #7; poor tolerance to [place].  # April 15 th 2020- Start Taxol weekly x 3; one week OFF  # summer-/fall 2019-Bil PE/kidney infract; factor V Leiden Leiden - xarelto [small bowel obstruction]; moved to [facility]   #Fall 2019 PE/renal infarct on Xarelto-factor V Leiden  ------------------------------------------------------------- She was treated by  Decision was made to pursue concurrent chemotherapy (weekly cisplatin) and radiation.  She received treatment from 11/2016-05/2017.  01/2017 cisplatin x 2 and carboplatin x 1 (01/29/2017) due to ARF and XRT.  XRT was followed by T & O on 02/01/2017 and T & N 02/10/2017 and 02/20/2017.  Course was complicated by 80 pound weight loss, nausea, vomiting, electrolyte wasting (potassium and magnesium).  She describes that.  Is been sick constantly requiring at least 20 hospitalizations.  Follow-up CT chest and PET on 06/2017. Per patient, 'radiation worked' and no disease in the abdomen.  At that time she was noted to have lung nodules that were growing and follow-up imaging was scheduled for 10/2017.  She was admitted to hospital in [place] for small bowel obstruction which was managed conservatively and  she was home for a week prior to traveling to [place] for Thanksgiving holiday where she has family.  She presented to ER in [place] on 08/2017 with nausea, vomiting, and lower abdominal pain.  Symptoms did not respond to conservative treatment.  CT on 08/26/2017 revealed small bowel obstruction with transition in the pelvis just superior to the uterus rather was a long segment of distal ileum with fatty wall thickening compatible with chronic inflammation and/or radiation enteritis. Imaging showed numerous pulmonary nodules consistent with metastatic disease. She underwent laparotomy and right ileocolectomy on 08/31/2017 at  [facility].  Surgical findings revealed a thickened, matted, and scarred piece of distal small bowel close to the ileocecal valve.  She was discharged on 09/05/2017.  Pain markedly increased in intensity and imaging was performed on 09/11/2017 which revealed: Debris within the anterior abdominal wall incision concerning for infection versus packing material, s/p post ileo-colectomy with expected postoperative changes, mild colonic ileus, numerous pulmonary nodules highly concerning for metastatic disease, punctate nonobstructing nephrolithiasis.  Staples were removed and one was packed.  She was started on doxycycline.  Abdominal and pelvic CT without contrast on 09/11/2017 revealed debris within anterior abdominal wall incision concerning for infection, versus packing material.She is s/pileocolectomy with expected postoperative changes and mild colonic ileus. There were numerous pulmonary nodules highly concerning for metastatic diseaseand punctate nonobstructing nephrolithiasis. She was readmitted on 09/12/2017. She describes the onset of lower abdominal pain on 09/09/2017. Pain markedly increased in intensity on 09/11/2017.  Staples were removed and the wound packed. She was started on doxycycline.  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  PET scan was ordered to evaluate enlarging lung nodules with concern for recurrent cervical cancer but scan was delayed due to insurance and need to be performed in [place].  Presented to ER on 12/03/2017 for abdominal pain and emesis.  Imaging concerning for worsening possible  uterine infarct and she was admitted to hospital.  Pelvic MRI was unremarkable.  Remote scarring type changes of uterus thought to be possibly related to radiation.  She was discharged on 12/08/2017.  Underwent endoscopy and colonoscopy on 12/20/2017.    On 02/22/2018 she underwent laser ablation of condylomata around the anus and vagina under anesthesia with Dr. Johney Maine.   04/15/2018: Chest, abdomen, and pelvis CTrevealed innumerable (>100) cavitary nodules scattered in the lungs, moderately enlarging compared to the 11/08/2017 PET-CT, suspicious for metastatic disease. One index node in the RLL measures 1.0 x 1.1 cm (previously 0.6 x 0.6 cm). There were no new nodules. There was an ill-defined wall thickening in the rectosigmoid with surrounding stranding along fascia planes, probably sequela from prior radiation therapy. There was multilevel lumbar impingement due to spondylosis and degenerative disc disease. There was heterogeneous enhancement in the uterus (some possibly from prior radiation therapy).  04/23/2018:PET scan revealednumerous scattered solid and cavitary nodules in the lungs stable increased in size compared to the prior PET-CT from 11/08/2017.Largest nodule was 1.1 cm in the LUL (SUV 1.9).These demonstratedlow-grade metabolic activity up to a maximum SUV of about 2.3, increased from 11/08/2017.   Case was discussed at tumor board on 04/25/2018. Consensus to pursue CT-guided biopsy (05/06/18)  which revealed: Metastatic adenocarcinoma, morphologically consistent with cervical adenocarcinoma.  She has history of chronic hepatitis C which is managed by GI.  Hepatitis C genotype is 2a/2c.  She receives B12 injections for history of B12 deficiency.  On 04/24/2018 she underwent left total knee replacement with Dr. [name]. --------------------------------------------------------   DIAGNOSIS: Cervical adenocarcinoma  STAGE: IV        ;GOALS: Palliative  CURRENT/MOST RECENT  THERAPY : Carbotaxol      Malignant neoplasm of overlapping sites of cervix ([place])   01/29/2019 -  Chemotherapy    The patient had PACLitaxel (TAXOL) 156 mg in sodium chloride 0.9 % 250 mL chemo infusion (</= 19m/m2), 80 mg/m2 = 156 mg, Intravenous,  Once, 1 of 4 cycles Dose modification: 65 mg/m2 (original dose 80 mg/m2, Cycle 1, Reason: Provider Judgment) Administration: 156 mg (01/29/2019), 156 mg (02/05/2019), 126 mg (02/12/2019)  for chemotherapy treatment.      ALLERGIES:  is allergic to ketamine.  MEDICATIONS:  Current Outpatient Medications  Medication Sig Dispense Refill   amitriptyline (ELAVIL) 75 MG tablet Take 1 tablet (75 mg total) by mouth at bedtime. 90 tablet 1   budesonide (ENTOCORT EC) 3 MG 24 hr capsule Take 3 capsules (9 mg total) by mouth daily. 30 capsule 0   Calcium Carb-Cholecalciferol (CALCIUM 500 +D) 500-400 MG-UNIT TABS Take 2 tablets by mouth daily.     diphenoxylate-atropine (LOMOTIL) 2.5-0.025 MG tablet TAKE 2 TABLETS BY MOUTH FOUR TIMES A DAY 240 tablet 0   famotidine (PEPCID) 20 MG tablet Take 1 tablet (20 mg total) by mouth 2 (two) times daily as needed for heartburn or indigestion.     hyoscyamine (LEVBID) 0.375 MG 12 hr tablet Take 1 tablet (0.375 mg total)by mouth 2 (two) times daily. 60 tablet 0   meclizine (ANTIVERT) 25 MG tablet Take 1 tablet (25 mg total) by mouth 2 (two) times daily as needed for dizziness. 15 tablet 0   Multiple Vitamins-Minerals (MULTIVITAMIN WITH MINERALS) tablet Take 1 tablet by mouth daily. 30 tablet 0   ondansetron (ZOFRAN ODT) 4 MG disintegrating tablet Take 1 tablet (4 mg total) by mouth every 8 (eight) hours as needed for nausea or vomiting. 20 tablet 0   oxyCODONE (OXYCONTIN) 15 mg 12 hr tablet Take 1 tablet (15 mg total) by mouth every 12 (twelve) hours. 30 tablet 0   Oxycodone HCl 10 MG TABS Take 1 tablet (10 mg total) by mouth every 6 (six) hours as needed. 60 tablet 0   pantoprazole (PROTONIX) 40 MG tablet  Take 1 tablet (40 mg total) by mouth daily. 90 tablet 0   potassium chloride SA (K-DUR,KLOR-CON) 20 MEQ tablet Take 1 tablet (20 mEq total) by mouth daily. 30 tablet 3   promethazine (PHENERGAN) 25 MG tablet Take 1 tablet (25 mg total) by mouth every 8 (eight) hours as needed for nausea or vomiting. 30 tablet 3   rivaroxaban (XARELTO) 20 MG TABS tablet Take 20 mg by mouth daily with supper.     VENTOLIN HFA 108 (90 Base) MCG/ACT inhaler Inhale 1-2 puffs into the lungs every 4 (four) hours as needed for shortness of breath.   0   VIBERZI 100 MG TABS TAKE 1 TABLET BY MOUTH TWICE DAILY WITH A MEAL 60 tablet 0   No current facility-administered medications for this visit.    Facility-Administered Medications Ordered in Other Visits  Medication Dose Route Frequency Provider Last Rate Last Dose   0.9 %  sodium chloride infusion   Intravenous Continuous  Kasean Denherder, Kirt Boys, NP 999 mL/hr at 02/17/19 1116     heparin lock flush 100 unit/mL  500 Units Intravenous Once Corcoran, Melissa C, MD       sodium chloride 0.9 % 1,000 mL with potassium chloride 20 mEq infusion   Intravenous Once Karen Kitchens, NP       sodium chloride flush (NS) 0.9 % injection 10 mL  10 mL Intravenous Once Malaijah Houchen, Kirt Boys, NP        VITAL SIGNS: BP 112/73 Comment: standing orthostatic   Pulse (!) 101    Temp (!) 97.2 F (36.2 C) (Tympanic)    Resp 18    Ht 5' 9"  (1.753 m)    Wt 173 lb 6.4 oz (78.7 kg)    BMI 25.61 kg/m  Filed Weights   02/17/19 1045  Weight: 173 lb 6.4 oz (78.7 kg)    Estimated body mass index is 25.61 kg/m as calculated from the following:   Height as of this encounter: 5' 9"  (1.753 m).   Weight as of this encounter: 173 lb 6.4 oz (78.7 kg).  LABS: CBC:    Component Value Date/Time   WBC 1.7 (L) 02/17/2019 1049   HGB 9.2 (L) 02/17/2019 1049   HCT 27.3 (L) 02/17/2019 1049   PLT 94 (L) 02/17/2019 1049   MCV 105.0 (H) 02/17/2019 1049   NEUTROABS 1.2 (L) 02/17/2019 1049   LYMPHSABS 0.4 (L)  02/17/2019 1049   MONOABS 0.1 02/17/2019 1049   EOSABS 0.0 02/17/2019 1049   BASOSABS 0.0 02/17/2019 1049   Comprehensive Metabolic Panel:    Component Value Date/Time   NA 135 02/17/2019 1049   K 4.2 02/17/2019 1049   CL 102 02/17/2019 1049   CO2 26 02/17/2019 1049   BUN 13 02/17/2019 1049   CREATININE 0.67 02/17/2019 1049   GLUCOSE 94 02/17/2019 1049   CALCIUM 9.2 02/17/2019 1049   AST 43 (H) 02/17/2019 1049   ALT 62 (H) 02/17/2019 1049   ALKPHOS 136 (H) 02/17/2019 1049   BILITOT 0.6 02/17/2019 1049   PROT 7.4 02/17/2019 1049   ALBUMIN 3.7 02/17/2019 1049    RADIOGRAPHIC STUDIES: Ct Head Wo Contrast  Result Date: 01/26/2019 CLINICAL DATA:  Altered level of consciousness. EXAM: CT HEAD WITHOUT CONTRAST TECHNIQUE: Contiguous axial images were obtained from the base of the skull through the vertex without intravenous contrast. COMPARISON:  None. FINDINGS: Brain: No evidence of acute infarction, hemorrhage, hydrocephalus, extra-axial collection or mass lesion/mass effect. Vascular: No hyperdense vessel or unexpected calcification. Skull: Normal. Negative for fracture or focal lesion. Sinuses/Orbits: No acute finding. Other: None. IMPRESSION: Normal head CT. Electronically Signed   By: Marijo Conception, M.D.   On: 01/26/2019 22:19   Ct Abdomen Pelvis W Contrast  Result Date: 01/23/2019 CLINICAL DATA:  Severe pelvic pain with nausea.  Cervical cancer. EXAM: CT ABDOMEN AND PELVIS WITH CONTRAST TECHNIQUE: Multidetector CT imaging of the abdomen and pelvis was performed using the standard protocol following bolus administration of intravenous contrast. CONTRAST:  48m OMNIPAQUE IOHEXOL 300 MG/ML  SOLN COMPARISON:  12/16/2018 FINDINGS: Lower chest: Multiple pulmonary metastases again noted without substantial interval change. 8 mm index lesion posterior left lower lobe (11/3) is stable since prior. Hepatobiliary: No suspicious focal abnormality within the liver parenchyma. Trace prominence  intrahepatic bile ducts with extrahepatic common duct measuring 14 mm diameter, stable since prior study. Gallbladder surgically absent. Pancreas: No focal mass lesion. No dilatation of the main duct. No intraparenchymal cyst. No peripancreatic edema. Spleen:  No splenomegaly. No focal mass lesion. Adrenals/Urinary Tract: No adrenal nodule or mass. 1 mm nonobstructing stone identified lower pole right kidney (34/2). Left kidney unremarkable. No evidence for hydroureter. The urinary bladder appears normal for the degree of distention. Stomach/Bowel: Stomach is unremarkable. No gastric wall thickening. No evidence of outlet obstruction. Duodenum is normally positioned as is the ligament of Treitz. No small bowel wall thickening. No small bowel dilatation. Status post right hemicolectomy. Wall thickening in the sigmoid colon and rectum is similar to prior. Vascular/Lymphatic: There is abdominal aortic atherosclerosis without aneurysm. 12 mm short axis hepato duodenal ligament lymph node (19/2) is stable. Other upper normal to borderline enlarged hepato duodenal ligament lymph nodes are similar. No para-aortic lymphadenopathy by size criteria. No pelvic sidewall lymphadenopathy. Reproductive: The uterus is unremarkable.  There is no adnexal mass. Other: No intraperitoneal free fluid. Musculoskeletal: Presacral and perirectal edema is similar to prior. Similar appearance diffusely heterogeneous bone mineralization. Lumbar scoliosis with advanced degenerative disc disease. Tiny gas locules to the right of the L4-5 interspace are compatible with degenerative change. IMPRESSION: 1. No substantial interval change in exam. No findings to explain the patient's history of pelvic pain. 2. Similar appearance of pulmonary metastases identified in the lung bases. 3. Stable extrahepatic biliary duct dilatation in this patient status post cholecystectomy. 4. Wall thickening in the sigmoid colon and rectum is similar to prior.  Perirectal and presacral edema also not substantially changed. Features likely reflect sequelae of radiation therapy. Electronically Signed   By: Misty Stanley M.D.   On: 01/23/2019 14:01   US Venous Img Lower Unilateral Left  Result Date: 01/22/2019 CLINICAL DATA:  Left leg swelling for 1 week EXAM: LEFT LOWER EXTREMITY VENOUS DOPPLER ULTRASOUND TECHNIQUE: Gray-scale sonography with graded compression, as well as color Doppler and duplex ultrasound were performed to evaluate the lower extremity deep venous systems from the level of the common femoral vein and including the common femoral, femoral, profunda femoral, popliteal and calf veins including the posterior tibial, peroneal and gastrocnemius veins when visible. The superficial great saphenous vein was also interrogated. Spectral Doppler was utilized to evaluate flow at rest and with distal augmentation maneuvers in the common femoral, femoral and popliteal veins. COMPARISON:  None. FINDINGS: Contralateral Common Femoral Vein: Respiratory phasicity is normal and symmetric with the symptomatic side. No evidence of thrombus. Normal compressibility. Common Femoral Vein: No evidence of thrombus. Normal compressibility, respiratory phasicity and response to augmentation. Saphenofemoral Junction: No evidence of thrombus. Normal compressibility and flow on color Doppler imaging. Profunda Femoral Vein: No evidence of thrombus. Normal compressibility and flow on color Doppler imaging. Femoral Vein: No evidence of thrombus. Normal compressibility, respiratory phasicity and response to augmentation. Popliteal Vein: No evidence of thrombus. Normal compressibility, respiratory phasicity and response to augmentation. Calf Veins: No evidence of thrombus. Normal compressibility and flow on color Doppler imaging. Superficial Great Saphenous Vein: No evidence of thrombus. Normal compressibility. Venous Reflux:  None. Other Findings:  None. IMPRESSION: No evidence of deep  venous thrombosis. These results will be called to the ordering clinician or representative by the Radiology Department at the imaging location. Electronically Signed   By: Inez Catalina M.D.   On: 01/22/2019 16:21   Dg Chest Portable 1 View  Result Date: 01/26/2019 CLINICAL DATA:  Current history of metastatic ovarian cancer, presenting with acute cough, generalized weakness and nausea. EXAM: PORTABLE CHEST 1 VIEW COMPARISON:  12/25/2018 and earlier, including CT chest 09/30/2018. FINDINGS: Cardiac silhouette normal in size, unchanged. Thoracic aorta  mildly atherosclerotic, unchanged. Hilar and mediastinal contours otherwise unremarkable. The multiple small nodules throughout both lungs identified on the prior CT are inconspicuous on the portable x-ray. No new or suspicious findings in either lung. No pleural effusions. RIGHT jugular Port-A-Cath tip projects over the mid SVC, unchanged. IMPRESSION: 1. No acute cardiopulmonary disease. 2. The multiple small nodules throughout both lungs identified on the prior CT are inconspicuous on the portable x-ray. Electronically Signed   By: Evangeline Dakin M.D.   On: 01/26/2019 16:20    PERFORMANCE STATUS (ECOG) : 1 - Symptomatic but completely ambulatory  REVIEW OF SYSTEMS: As noted above. Otherwise, a complete review of systems is negative.  PHYSICAL EXAM: General: NAD, frail appearing, thin Cardiac: Regular rate and rhythm Pulmonary: Unlabored, clear to auscultation anterior posterior fields Abdomen: Soft, nontender to palp Extremities: no edema Skin: no rashes Neurological: Weakness but otherwise nonfocal  IMPRESSION: Patient was an acute add-on to the clinic today for complaint of weakness and dizziness with position changes.  She reports dry oral mucosa.  Has a history of diarrhea chronically but unchanged over the past 24 to 48 hours.  She denies any fever or chills.  Denies UTI symptoms.  Denies cough or shortness of breath.  She is  non-ill-appearing.  Orthostatics checked.  CBC and C met reviewed.  Patient appears clinically dry.  She was given 1 L of IV fluids in the clinic today and felt significantly improved afterwards. Encourage p.o. fluids.  She may return to the clinic as needed.  PLAN: -Continue current scope of treatment -CBC, C met, magnesium today -1 L normal saline -Continue oxycodone 10 mg every 6 hours as needed for pain  -Continue OxyContin 15 mg every 12 hours -Will plan to complete MOST form during future visit -RTC 1 month   Patient expressed understanding and was in agreement with this plan. She also understands that She can call clinic at any time with any questions, concerns, or complaints.     Time Total: 20 minutes  Visit consisted of counseling and education dealing with the complex and emotionally intense issues of symptom management and palliative care in the setting of serious and potentially life-threatening illness.Greater than 50%  of this time was spent counseling and coordinating care related to the above assessment and plan.  Signed by: Altha Harm, PhD, NP-C [phone] (Work Cell)

## 2019-02-17 NOTE — Telephone Encounter (Signed)
Please - have patient come in now. - cbc metc, mag. See [name] and lab/port/symptom mgmt clinic for fluids. This am. Joanna Hall. Does patient report any diarrhea?  I will take on the assignment for IV fluids.

## 2019-02-17 NOTE — Telephone Encounter (Signed)
Thanks, Nira Conn & josh! GB

## 2019-02-17 NOTE — Telephone Encounter (Signed)
Patient will be there in 20 minutes

## 2019-02-17 NOTE — Telephone Encounter (Signed)
Patient called asking to come in for IV fluids stating she is dizzy, heart is racing and she needs flds. Do you want labs and see her too?

## 2019-02-17 NOTE — Telephone Encounter (Signed)
Yes, I spoke with Nira Conn who can help coordinate. Thanks.

## 2019-02-19 ENCOUNTER — Inpatient Hospital Stay: Payer: Medicaid Other

## 2019-02-19 ENCOUNTER — Ambulatory Visit: Payer: Medicaid Other | Admitting: Internal Medicine

## 2019-02-19 ENCOUNTER — Other Ambulatory Visit: Payer: Medicaid Other

## 2019-02-19 ENCOUNTER — Other Ambulatory Visit: Payer: Self-pay

## 2019-02-19 ENCOUNTER — Inpatient Hospital Stay (HOSPITAL_BASED_OUTPATIENT_CLINIC_OR_DEPARTMENT_OTHER): Payer: Medicaid Other | Admitting: Nurse Practitioner

## 2019-02-19 ENCOUNTER — Ambulatory Visit: Payer: Medicaid Other

## 2019-02-19 ENCOUNTER — Encounter: Payer: Self-pay | Admitting: Nurse Practitioner

## 2019-02-19 VITALS — BP 122/82 | HR 79 | Temp 96.6°F | Resp 18

## 2019-02-19 DIAGNOSIS — Z5111 Encounter for antineoplastic chemotherapy: Secondary | ICD-10-CM | POA: Diagnosis not present

## 2019-02-19 DIAGNOSIS — E876 Hypokalemia: Secondary | ICD-10-CM | POA: Diagnosis not present

## 2019-02-19 DIAGNOSIS — C538 Malignant neoplasm of overlapping sites of cervix uteri: Secondary | ICD-10-CM

## 2019-02-19 DIAGNOSIS — R0602 Shortness of breath: Secondary | ICD-10-CM

## 2019-02-19 DIAGNOSIS — N939 Abnormal uterine and vaginal bleeding, unspecified: Secondary | ICD-10-CM

## 2019-02-19 DIAGNOSIS — Z79899 Other long term (current) drug therapy: Secondary | ICD-10-CM

## 2019-02-19 DIAGNOSIS — C7800 Secondary malignant neoplasm of unspecified lung: Secondary | ICD-10-CM

## 2019-02-19 DIAGNOSIS — Z923 Personal history of irradiation: Secondary | ICD-10-CM

## 2019-02-19 LAB — BASIC METABOLIC PANEL
Anion gap: 8 (ref 5–15)
BUN: 12 mg/dL (ref 6–20)
CO2: 25 mmol/L (ref 22–32)
Calcium: 9.1 mg/dL (ref 8.9–10.3)
Chloride: 103 mmol/L (ref 98–111)
Creatinine, Ser: 0.81 mg/dL (ref 0.44–1.00)
GFR calc Af Amer: >60 mL/min (ref >60)
GFR calc non Af Amer: >60 mL/min (ref >60)
Glucose, Bld: 130 mg/dL — ABNORMAL HIGH (ref 70–99)
Potassium: 3.3 mmol/L — ABNORMAL LOW (ref 3.5–5.1)
Sodium: 136 mmol/L (ref 135–145)

## 2019-02-19 LAB — MAGNESIUM: Magnesium: 1.5 mg/dL — ABNORMAL LOW (ref 1.7–2.4)

## 2019-02-19 MED ORDER — HEPARIN SOD (PORK) LOCK FLUSH 100 UNIT/ML IV SOLN
500.0000 [IU] | Freq: Once | INTRAVENOUS | Status: AC
  Administered 2019-02-19: 500 [IU] via INTRAVENOUS
  Filled 2019-02-19: qty 5

## 2019-02-19 MED ORDER — SODIUM CHLORIDE 0.9 % IV SOLN
Freq: Once | INTRAVENOUS | Status: AC
  Administered 2019-02-19: 11:00:00 via INTRAVENOUS
  Filled 2019-02-19: qty 1000

## 2019-02-19 MED ORDER — SODIUM CHLORIDE 0.9 % IV SOLN
Freq: Once | INTRAVENOUS | Status: AC
  Administered 2019-02-19: 11:00:00 via INTRAVENOUS
  Filled 2019-02-19: qty 250

## 2019-02-19 MED ORDER — VENTOLIN HFA 108 (90 BASE) MCG/ACT IN AERS: 1.0000 | INHALATION_SPRAY | RESPIRATORY_TRACT | 1 refills | Status: AC | PRN

## 2019-02-19 MED ORDER — SODIUM CHLORIDE 0.9% FLUSH
10.0000 mL | INTRAVENOUS | Status: DC | PRN
  Administered 2019-02-19: 10:00:00 10 mL via INTRAVENOUS
  Filled 2019-02-19: qty 10

## 2019-02-19 NOTE — Progress Notes (Signed)
Symptom [facility]  [phone] [phone]  Patient Care Team: Arnetha Courser, MD as PCP - General (Family Medicine) Mellody Drown, MD as Consulting Physician (Obstetrics and Gynecology) Lenor Coffin, MD as Attending Physician (Gynecology) Lequita Asal, MD as Medical Oncologist (Hematology and Oncology) [name], MD as Consulting Physician (Gastroenterology) Michael Boston, MD as Consulting Physician (General Surgery) Lovell Sheehan, MD as Consulting Physician (Orthopedic Surgery) Clent Jacks, RN as Registered Nurse   Name of the patient: Joanna Hall  [id number]  [birth year]   Date of visit: 02/19/19  Diagnosis-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 Christene Slates at [facility] in [place], [place];No surgery- Chemo-RT;  # 2018-sep - lung nodules [in [place]]  # AUG 20th, 2019-#1 Carbo-Taxol s/p 6 cycles- [No avastin sec to blood clots]: Feb 2020-bilateral lung nodules with 1 cm in size. March 2020- finished carbo-taxol #7; poor tolerance to [place].  # April 15 th 2020- Start Taxol weekly x 3; one week OFF  # summer-/fall 2019-Bil PE/kidney infract; factor V Leiden Leiden - xarelto [small bowel obstruction]; moved to [facility]   #Fall 2019 PE/renal infarct on Xarelto-factor V Leiden  ------------------------------------------------------------- She was treated by  Decision was made to pursue concurrent chemotherapy (weekly cisplatin) and radiation.  She received treatment from 11/2016-05/2017.  01/2017 cisplatin x 2 and carboplatin x 1 (01/29/2017) due to ARF and XRT.  XRT was followed by T & O on 02/01/2017 and T & N 02/10/2017 and 02/20/2017.  Course was complicated by 80  pound weight loss, nausea, vomiting, electrolyte wasting (potassium and magnesium).  She describes that.  Is been sick constantly requiring at least 20 hospitalizations.  Follow-up CT chest and PET on 06/2017. Per patient, 'radiation worked' and no disease in the abdomen.  At that time she was noted to have lung nodules that were growing and follow-up imaging was scheduled for 10/2017.  She was admitted to hospital in [place] for small bowel obstruction which was managed conservatively and she was home for a week prior to traveling to [place] for Thanksgiving holiday where she has family.  She presented to ER in [place] on 08/2017 with nausea, vomiting, and lower abdominal pain.  Symptoms did not respond to conservative treatment.  CT on 08/26/2017 revealed small bowel obstruction with transition in the pelvis just superior to the uterus rather was a long segment of distal ileum with fatty wall thickening compatible with chronic inflammation and/or radiation enteritis. Imaging showed numerous pulmonary nodules consistent with metastatic disease. She underwent laparotomy and right ileocolectomy on 08/31/2017 at [facility].  Surgical findings revealed a thickened, matted, and scarred piece of distal small bowel close to the ileocecal valve.  She was discharged on 09/05/2017.  Pain markedly increased in intensity and imaging was performed on 09/11/2017 which revealed: Debris within the anterior abdominal wall incision concerning for infection versus packing material, s/p post ileo-colectomy with expected postoperative changes, mild colonic ileus, numerous pulmonary nodules highly concerning for metastatic disease, punctate nonobstructing nephrolithiasis.  Staples were removed and one was packed.  She was started on doxycycline.  Abdominal and pelvic CT without contrast on 09/11/2017 revealed debris within anterior abdominal wall incision concerning for infection, versus packing material.She  is s/pileocolectomy with expected postoperative changes  and mild colonic ileus. There were numerous pulmonary nodules highly concerning for metastatic diseaseand punctate nonobstructing nephrolithiasis. She was readmitted on 09/12/2017. She describes the onset of lower abdominal pain on 09/09/2017. Pain markedly increased in intensity on 09/11/2017.  Staples were removed and the wound packed. She was started on doxycycline.  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  PET scan was ordered to evaluate enlarging lung nodules with concern for recurrent cervical cancer but scan was delayed due to insurance and need to be performed in [place].  Presented to ER on 12/03/2017 for abdominal pain and emesis.  Imaging concerning for worsening possible uterine infarct and she was admitted to hospital.  Pelvic MRI was unremarkable.  Remote scarring type changes of uterus thought to be possibly related to radiation.  She was discharged on 12/08/2017.  Underwent endoscopy and colonoscopy on 12/20/2017.    On 02/22/2018 she underwent laser ablation of condylomata around the anus and vagina under anesthesia with Dr. Johney Maine.   04/15/2018: Chest, abdomen, and pelvis CTrevealed innumerable (>100) cavitary nodules scattered in the lungs, moderately enlarging compared to the 11/08/2017 PET-CT, suspicious for metastatic disease. One index node in the RLL measures 1.0 x 1.1 cm (previously 0.6 x 0.6 cm). There were no new nodules. There was an ill-defined wall thickening in the rectosigmoid with surrounding stranding along fascia  planes, probably sequela from prior radiation therapy. There was multilevel lumbar impingement due to spondylosis and degenerative disc disease. There was heterogeneous enhancement in the uterus (some possibly from prior radiation therapy).  04/23/2018:PET scan revealednumerous scattered solid and cavitary nodules in the lungs stable increased in size compared to the prior PET-CT from 11/08/2017.Largest nodule was 1.1 cm in the LUL (SUV 1.9).These demonstratedlow-grade metabolic activity up to a maximum SUV of about 2.3, increased from 11/08/2017.   Case was discussed at tumor board on 04/25/2018. Consensus to pursue CT-guided biopsy (05/06/18) which revealed: Metastatic adenocarcinoma, morphologically consistent with cervical adenocarcinoma.  She has history of chronic hepatitis C which is managed by GI.  Hepatitis C genotype is 2a/2c.  She receives B12 injections for history of B12 deficiency.  On 04/24/2018 she underwent left total knee replacement with Dr. [name]. --------------------------------------------------------   DIAGNOSIS: Cervical adenocarcinoma  STAGE: IV        ;GOALS: Palliative  CURRENT/MOST RECENT THERAPY : Carbotaxol      Malignant neoplasm of overlapping sites of cervix ([place])   01/29/2019 -  Chemotherapy    The patient had PACLitaxel (TAXOL) 156 mg in sodium chloride 0.9 % 250 mL chemo infusion (</= 84m/m2), 80 mg/m2 = 156 mg, Intravenous,  Once, 1 of 4 cycles Dose modification: 65 mg/m2 (original dose 80 mg/m2, Cycle 1, Reason: Provider Judgment) Administration: 156 mg (01/29/2019), 156 mg (02/05/2019), 126 mg (02/12/2019)  for chemotherapy treatment.      Interval history-Orla Mcinturff, 53year old female with above history of adenocarcinoma of the cervix with metastases to lung, currently on weekly Taxol, presents to symptom management clinic for vaginal spotting after light abnormalities.  States that upon waking this morning she noticed several drops of  blood in her underwear which she believes came from vagina.  Not enough  to saturate a pad or panty liner.  She is unsure of anything that caused the symptoms and feels they occurred spontaneously.  Has had symptoms like this in the past they resolve spontaneously.  Chronically anticoagulated with Xarelto for history of factor V Leiden.  No bleeding in stools.  She complains of some shortness of breath that occurs with exertion.  Symptoms resolve with rest.  No acute symptoms today.  She denies chest pain.  She was seen in symptom management on 02/10/2019 for chest pain that occurred while receiving IV fluids.  EKG was normal.  Troponin and CK were normal.  History of heart murmur.  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  She uses albuterol inhaler sporadically which improved symptoms and requests refill today.  She continues to have some mucousy diarrhea intermittently.  Pain is currently well controlled.  She denies other specific complaints.  ECOG FS:1 - Symptomatic but completely ambulatory  Review of systems- Review of Systems  Constitutional: Positive for malaise/fatigue. Negative for chills, fever and weight loss (weight essentially stable).  HENT: Negative for congestion and sore throat.   Eyes: Negative.   Respiratory: Positive for shortness of breath (per hpi). Negative for cough, hemoptysis, sputum production, wheezing and stridor.   Cardiovascular: Negative for chest pain, palpitations, orthopnea, claudication and leg swelling.  Gastrointestinal: Positive for abdominal pain (chronic; unchanged & well controlled) and diarrhea.  Genitourinary: Negative.  Negative for dysuria, frequency and urgency.  Musculoskeletal: Negative for falls and myalgias.  Skin: Negative.   Neurological: Negative for loss of consciousness, weakness and headaches.  Psychiatric/Behavioral: Positive for depression. The patient is nervous/anxious. The patient does not have insomnia.      Current  treatment- weekly taxol  Allergies  Allergen Reactions   Ketamine Anxiety and Other (See Comments)    Syncope episode/confusion     Past Medical History:  Diagnosis Date   Abdominal pain 06/10/2018   Abnormal cervical Papanicolaou smear 09/18/2017   Anxiety    Aortic atherosclerosis (HCC)    Arthritis    neck and knees   Blood clots in brain    both lungs and right kidney   Blood transfusion without reported diagnosis    Cervical cancer ([place]) 09/2016   mets lung   Chronic anal fissure    Chronic diarrhea    Dyspnea    Erosive gastropathy 09/18/2017   Factor V Leiden mutation ([place])    Fecal incontinence    Genital warts    GERD (gastroesophageal reflux disease)    Heart murmur    Hematochezia    Hemorrhoids    Hepatitis C    Chronic, 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   Intestinal infection due to Clostridium difficile 09/18/2017   Macrocytic anemia with vitamin B12 deficiency    Multiple gastric ulcers    Nausea vomiting and diarrhea    Pancolitis ([place]) 07/27/2018   Perianal condylomata    Pneumonia    History of   Pulmonary nodules    Rectal bleeding    Small bowel obstruction ([place]) 08/2017   Stiff neck    limited right turn   Vitamin D deficiency     Past Surgical  History:  Procedure Laterality Date   CHOLECYSTECTOMY     COLON SURGERY  08/2017   resection   COLONOSCOPY WITH PROPOFOL N/A 12/20/2017   Procedure: COLONOSCOPY WITH PROPOFOL;  Surgeon: [name], MD;  Location: [facility];  Service: Gastroenterology;  Laterality: N/A;   COLONOSCOPY WITH PROPOFOL N/A 07/30/2018   Procedure: COLONOSCOPY WITH PROPOFOL;  Surgeon: [name], MD;  Location[facility]COPY;  Service: Gastroenterology;  Laterality: N/A;   COLONOSCOPY WITH PROPOFOL N/A 10/10/2018   Procedure: COLONOSCOPY WITH PROPOFOL;  Surgeon: Lucilla Lame, MD;  Location: [facility] ENDOSCOPY;  Service: Endoscopy;  Laterality: N/A;   DIAGNOSTIC LAPAROSCOPY     ESOPHAGOGASTRODUODENOSCOPY (EGD) WITH PROPOFOL N/A 12/20/2017   Procedure: ESOPHAGOGASTRODUODENOSCOPY (EGD) WITH PROPOFOL;  Surgeon: [name], MD;  Location: [facility] ENDOSCOPY;  Service: Gastroenterology;  Laterality: N/A;   ESOPHAGOGASTRODUODENOSCOPY (EGD) WITH PROPOFOL  07/30/2018   Procedure: ESOPHAGOGASTRODUODENOSCOPY (EGD) WITH PROPOFOL;  Surgeon: [name], MD;  Location: [facility] ENDOSCOPY;  Service: Gastroenterology;;   [place] N/A 11/21/2018   Procedure: FLEXIBLE SIGMOIDOSCOPY;  Surgeon: [name], MD;  Location: [facility] ENDOSCOPY;  Service: Gastroenterology;  Laterality: N/A;   LAPAROTOMY N/A 08/31/2017   Procedure: EXPLORATORY LAPAROTOMY for SBO, ileocolectomy, removal of piece of uterine wall;  Surgeon: [name], MD;  Location: [facility] ORS;  Service: General;  Laterality: N/A;   LASER ABLATION CONDOLAMATA N/A 02/22/2018   Procedure: LASER ABLATION/REMOVAL OF [id number] AROUND ANUS AND VAGINA;  Surgeon: Michael Boston, MD;  Location: [place];  Service: General;  Laterality: N/A;   OOPHORECTOMY     PORTA CATH INSERTION N/A 05/13/2018   Procedure: PORTA CATH INSERTION;  Surgeon: Katha Cabal, MD;  Location: [place] [facility];  Service: Cardiovascular;  Laterality: N/A;   SMALL INTESTINE SURGERY     TANDEM RING INSERTION     x3   THORACOTOMY     TOTAL KNEE ARTHROPLASTY Left 04/24/2018   Procedure: TOTAL KNEE ARTHROPLASTY;  Surgeon: Lovell Sheehan, MD;  Location: [facility] ORS;  Service: Orthopedics;  Laterality: Left;    Social History   Socioeconomic History   Marital status: Divorced    Spouse name: Not on file    Number of children: Not on file   Years of education: Not on file   Highest education level: Not on file  Occupational History   Not on file  Social Needs   Financial resource strain: Not hard at all   Food insecurity:    Worry: Never true    Inability: Never true   Transportation needs:    Medical: No    Non-medical: No  Tobacco Use   Smoking status: Former Smoker    Last attempt to quit: 10/16/2006    Years since quitting: 12.3   Smokeless tobacco: Never Used  Substance and Sexual Activity   Alcohol use: Not Currently    Frequency: Never    Comment: seldom   Drug use: Yes    Types: Marijuana   Sexual activity: Not Currently    Birth control/protection: Post-menopausal    Comment: Not Asked  Lifestyle   Physical activity:    Days per week: Patient refused    Minutes per session: Patient refused   Stress: Only a little  Relationships   Social connections:    Talks on phone: Patient refused    Gets together: Patient refused    Attends religious service: Patient refused    Active member of club or organization: Patient refused  Attends meetings of clubs or organizations: Patient refused    Relationship status: Patient refused   Intimate partner violence:    Fear of current or ex partner: No    Emotionally abused: No    Physically abused: No    Forced sexual activity: No  Other Topics Concern   Not on file  Social History Narrative   Not on file    Family History  Problem Relation Age of Onset   Hypertension Father    Diabetes Father    Alcohol abuse Daughter    Hypertension Maternal Grandmother    Diabetes Maternal Grandmother    Diabetes Paternal Grandmother    Hypertension Paternal Grandmother      Current Outpatient Medications:    amitriptyline (ELAVIL) 75 MG tablet, Take 1 tablet (75 mg total) by mouth at bedtime., Disp: 90 tablet, Rfl: 1   budesonide (ENTOCORT EC) 3 MG 24 hr capsule, Take 3 capsules (9 mg total) by mouth  daily., Disp: 30 capsule, Rfl: 0   Calcium Carb-Cholecalciferol (CALCIUM 500 +D) 500-400 MG-UNIT TABS, Take 2 tablets by mouth daily., Disp: , Rfl:    diphenoxylate-atropine (LOMOTIL) 2.5-0.025 MG tablet, TAKE 2 TABLETS BY MOUTH FOUR TIMES A DAY, Disp: 240 tablet, Rfl: 0   famotidine (PEPCID) 20 MG tablet, Take 1 tablet (20 mg total) by mouth 2 (two) times daily as needed for heartburn or indigestion., Disp: , Rfl:    hyoscyamine (LEVBID) 0.375 MG 12 hr tablet, Take 1 tablet (0.375 mg total)by mouth 2 (two) times daily., Disp: 60 tablet, Rfl: 0   meclizine (ANTIVERT) 25 MG tablet, Take 1 tablet (25 mg total) by mouth 2 (two) times daily as needed for dizziness., Disp: 15 tablet, Rfl: 0   Multiple Vitamins-Minerals (MULTIVITAMIN WITH MINERALS) tablet, Take 1 tablet by mouth daily., Disp: 30 tablet, Rfl: 0   ondansetron (ZOFRAN ODT) 4 MG disintegrating tablet, Take 1 tablet (4 mg total) by mouth every 8 (eight) hours as needed for nausea or vomiting., Disp: 20 tablet, Rfl: 0   oxyCODONE (OXYCONTIN) 15 mg 12 hr tablet, Take 1 tablet (15 mg total) by mouth every 12 (twelve) hours., Disp: 30 tablet, Rfl: 0   Oxycodone HCl 10 MG TABS, Take 1 tablet (10 mg total) by mouth every 6 (six) hours as needed., Disp: 60 tablet, Rfl: 0   pantoprazole (PROTONIX) 40 MG tablet, Take 1 tablet (40 mg total) by mouth daily., Disp: 90 tablet, Rfl: 0   potassium chloride SA (K-DUR,KLOR-CON) 20 MEQ tablet, Take 1 tablet (20 mEq total) by mouth daily., Disp: 30 tablet, Rfl: 3   promethazine (PHENERGAN) 25 MG tablet, Take 1 tablet (25 mg total) by mouth every 8 (eight) hours as needed for nausea or vomiting., Disp: 30 tablet, Rfl: 3   rivaroxaban (XARELTO) 20 MG TABS tablet, Take 20 mg by mouth daily with supper., Disp: , Rfl:    VENTOLIN HFA 108 (90 Base) MCG/ACT inhaler, Inhale 1-2 puffs into the lungs every 4 (four) hours as needed for shortness of breath., Disp: 1 Inhaler, Rfl: 1   VIBERZI 100 MG TABS, TAKE 1  TABLET BY MOUTH TWICE DAILY WITH A MEAL, Disp: 60 tablet, Rfl: 0 No current facility-administered medications for this visit.   Facility-Administered Medications Ordered in Other Visits:    heparin lock flush 100 unit/mL, 500 Units, Intravenous, Once, Corcoran, Melissa C, MD   sodium chloride 0.9 % 1,000 mL with potassium chloride 20 mEq infusion, , Intravenous, Once, Karen Kitchens, NP   sodium  chloride flush (NS) 0.9 % injection 10 mL, 10 mL, Intravenous, Once, Borders, Vonna Kotyk R, NP   sodium chloride flush (NS) 0.9 % injection 10 mL, 10 mL, Intravenous, PRN, Cammie Sickle, MD, 10 mL at 02/19/19 1020  Physical exam:  Vitals:   02/19/19 1022  BP: 122/82  Pulse: 79  Resp: 18  Temp: (!) 96.6 F (35.9 C)  TempSrc: Tympanic  SpO2: 100%    Physical Exam Constitutional:      Comments: Chronically ill appearing; unaccompanied; seen in infusion suite  HENT:     Head: Atraumatic.     Comments: Alopecia Eyes:     General: No scleral icterus.    Conjunctiva/sclera: Conjunctivae normal.  Cardiovascular:     Rate and Rhythm: Normal rate and regular rhythm.     Heart sounds: Normal heart sounds.  Pulmonary:     Effort: Pulmonary effort is normal.     Breath sounds: Normal breath sounds.  Abdominal:     General: There is no distension.     Palpations: Abdomen is soft.     Tenderness: There is no abdominal tenderness.  Genitourinary:    Comments: Pelvic deferred Skin:    General: Skin is warm and dry.     Coloration: Skin is pale.  Neurological:     Mental Status: She is alert and oriented to person, place, and time.  Psychiatric:        Mood and Affect: Mood is anxious.        Behavior: Behavior normal.      CMP Latest Ref Rng & Units 02/19/2019  Glucose 70 - 99 mg/dL 130(H)  BUN 6 - 20 mg/dL 12  Creatinine 0.44 - 1.00 mg/dL 0.81  Sodium 135 - 145 mmol/L 136  Potassium 3.5 - 5.1 mmol/L 3.3(L)  Chloride 98 - 111 mmol/L 103  CO2 22 - 32 mmol/L 25  Calcium 8.9 -  10.3 mg/dL 9.1  Total Protein 6.5 - 8.1 g/dL -  Total Bilirubin 0.3 - 1.2 mg/dL -  Alkaline Phos 38 - 126 U/L -  AST 15 - 41 U/L -  ALT 0 - 44 U/L -   CBC Latest Ref Rng & Units 02/17/2019  WBC 4.0 - 10.5 K/uL 1.7(L)  Hemoglobin 12.0 - 15.0 g/dL 9.2(L)  Hematocrit 36.0 - 46.0 % 27.3(L)  Platelets 150 - 400 K/uL 94(L)    No images are attached to the encounter.  Ct Head Wo Contrast  Result Date: 01/26/2019 CLINICAL DATA:  Altered level of consciousness. EXAM: CT HEAD WITHOUT CONTRAST TECHNIQUE: Contiguous axial images were obtained from the base of the skull through the vertex without intravenous contrast. COMPARISON:  None. FINDINGS: Brain: No evidence of acute infarction, hemorrhage, hydrocephalus, extra-axial collection or mass lesion/mass effect. Vascular: No hyperdense vessel or unexpected calcification. Skull: Normal. Negative for fracture or focal lesion. Sinuses/Orbits: No acute finding. Other: None. IMPRESSION: Normal head CT. Electronically Signed   By: Marijo Conception, M.D.   On: 01/26/2019 22:19   Ct Abdomen Pelvis W Contrast  Result Date: 01/23/2019 CLINICAL DATA:  Severe pelvic pain with nausea.  Cervical cancer. EXAM: CT ABDOMEN AND PELVIS WITH CONTRAST TECHNIQUE: Multidetector CT imaging of the abdomen and pelvis was performed using the standard protocol following bolus administration of intravenous contrast. CONTRAST:  10m OMNIPAQUE IOHEXOL 300 MG/ML  SOLN COMPARISON:  12/16/2018 FINDINGS: Lower chest: Multiple pulmonary metastases again noted without substantial interval change. 8 mm index lesion posterior left lower lobe (11/3) is stable since prior. Hepatobiliary:  No suspicious focal abnormality within the liver parenchyma. Trace prominence intrahepatic bile ducts with extrahepatic common duct measuring 14 mm diameter, stable since prior study. Gallbladder surgically absent. Pancreas: No focal mass lesion. No dilatation of the main duct. No intraparenchymal cyst. No  peripancreatic edema. Spleen: No splenomegaly. No focal mass lesion. Adrenals/Urinary Tract: No adrenal nodule or mass. 1 mm nonobstructing stone identified lower pole right kidney (34/2). Left kidney unremarkable. No evidence for hydroureter. The urinary bladder appears normal for the degree of distention. Stomach/Bowel: Stomach is unremarkable. No gastric wall thickening. No evidence of outlet obstruction. Duodenum is normally positioned as is the ligament of Treitz. No small bowel wall thickening. No small bowel dilatation. Status post right hemicolectomy. Wall thickening in the sigmoid colon and rectum is similar to prior. Vascular/Lymphatic: There is abdominal aortic atherosclerosis without aneurysm. 12 mm short axis hepato duodenal ligament lymph node (19/2) is stable. Other upper normal to borderline enlarged hepato duodenal ligament lymph nodes are similar. No para-aortic lymphadenopathy by size criteria. No pelvic sidewall lymphadenopathy. Reproductive: The uterus is unremarkable.  There is no adnexal mass. Other: No intraperitoneal free fluid. Musculoskeletal: Presacral and perirectal edema is similar to prior. Similar appearance diffusely heterogeneous bone mineralization. Lumbar scoliosis with advanced degenerative disc disease. Tiny gas locules to the right of the L4-5 interspace are compatible with degenerative change. IMPRESSION: 1. No substantial interval change in exam. No findings to explain the patient's history of pelvic pain. 2. Similar appearance of pulmonary metastases identified in the lung bases. 3. Stable extrahepatic biliary duct dilatation in this patient status post cholecystectomy. 4. Wall thickening in the sigmoid colon and rectum is similar to prior. Perirectal and presacral edema also not substantially changed. Features likely reflect sequelae of radiation therapy. Electronically Signed   By: Misty Stanley M.D.   On: 01/23/2019 14:01   US Venous Img Lower Unilateral Left  Result  Date: 01/22/2019 CLINICAL DATA:  Left leg swelling for 1 week EXAM: LEFT LOWER EXTREMITY VENOUS DOPPLER ULTRASOUND TECHNIQUE: Gray-scale sonography with graded compression, as well as color Doppler and duplex ultrasound were performed to evaluate the lower extremity deep venous systems from the level of the common femoral vein and including the common femoral, femoral, profunda femoral, popliteal and calf veins including the posterior tibial, peroneal and gastrocnemius veins when visible. The superficial great saphenous vein was also interrogated. Spectral Doppler was utilized to evaluate flow at rest and with distal augmentation maneuvers in the common femoral, femoral and popliteal veins. COMPARISON:  None. FINDINGS: Contralateral Common Femoral Vein: Respiratory phasicity is normal and symmetric with the symptomatic side. No evidence of thrombus. Normal compressibility. Common Femoral Vein: No evidence of thrombus. Normal compressibility, respiratory phasicity and response to augmentation. Saphenofemoral Junction: No evidence of thrombus. Normal compressibility and flow on color Doppler imaging. Profunda Femoral Vein: No evidence of thrombus. Normal compressibility and flow on color Doppler imaging. Femoral Vein: No evidence of thrombus. Normal compressibility, respiratory phasicity and response to augmentation. Popliteal Vein: No evidence of thrombus. Normal compressibility, respiratory phasicity and response to augmentation. Calf Veins: No evidence of thrombus. Normal compressibility and flow on color Doppler imaging. Superficial Great Saphenous Vein: No evidence of thrombus. Normal compressibility. Venous Reflux:  None. Other Findings:  None. IMPRESSION: No evidence of deep venous thrombosis. These results will be called to the ordering clinician or representative by the Radiology Department at the imaging location. Electronically Signed   By: Inez Catalina M.D.   On: 01/22/2019 16:21   Dg Chest Portable  1  View  Result Date: 01/26/2019 CLINICAL DATA:  Current history of metastatic ovarian cancer, presenting with acute cough, generalized weakness and nausea. EXAM: PORTABLE CHEST 1 VIEW COMPARISON:  12/25/2018 and earlier, including CT chest 09/30/2018. FINDINGS: Cardiac silhouette normal in size, unchanged. Thoracic aorta mildly atherosclerotic, unchanged. Hilar and mediastinal contours otherwise unremarkable. The multiple small nodules throughout both lungs identified on the prior CT are inconspicuous on the portable x-ray. No new or suspicious findings in either lung. No pleural effusions. RIGHT jugular Port-A-Cath tip projects over the mid SVC, unchanged. IMPRESSION: 1. No acute cardiopulmonary disease. 2. The multiple small nodules throughout both lungs identified on the prior CT are inconspicuous on the portable x-ray. Electronically Signed   By: Evangeline Dakin M.D.   On: 01/26/2019 16:20    Assessment and plan- Patient is a 53 y.o. 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  1.  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  She developed a bowel obstruction and underwent ileocolectomy at [facility] 08/2017.  Started carbo-Taxol on 06/04/2018.  Tolerated poorly due to ongoing chronic diarrhea and multiple GI issues.  Currently on single agent Taxol-weekly.  Carbo dropped due to intolerable side effects.  CT abdomen pelvis 01/2019 showed stable disease.  Most recent cycle of Taxol dose reduced (65 mg/m) due to [place] 1.2 and platelets 88.  Her adenocarcinoma was PD-L1 positive and Keytruda could be considered.   2.  Vaginal bleeding-mild symptoms, not saturating panty liner and I suspect related to atrophic tissue complicated by chronic anticoagulation with Xarelto due to  Factor V Leiden.  CBC reviewed from 02/17/2019 which showed improved hemoglobin of 9.2/hct 27.3. Recommended monitoring symptoms at this time but that if symptoms worsen to return to clinic for pelvic exam.  If heavy bleeding I would refer her to [facility] ER for vaginal packing or embolization.  I discussed this with Dr. Fransisca Connors who agrees with plan of care.   3.  Shortness of breath-suspect related to general deconditioning and known lung disease though in setting of her untreated hep c I question pulmonary hypertension. Previously though, cardiac work-up has been negative. Last echo was 09/14/2017 with LVEF 60-65%.  If persistent symptoms, would consider repeating echo vs referral to cardiology.  4. Hypokalemia- Potassium 3.3 today. Likely secondary to chronic diarrhea and poor oral intake. Per Dr. Rogue Bussing, electrolyte replacement today in clinic. Continue oral potassium as prescribed.   5. Hypomagnesemia- Mag 1.5 today, slightly decreased. Electrolyte replacement today in clinic per Dr. Rogue Bussing.   rtc as scheduled on 02/26/2019 for repeat labs and consideration of weekly taxol with Dr. Rogue Bussing. If persistent or worsening symptoms please return to clinic sooner.   Visit Diagnosis 1. Malignant neoplasm of overlapping sites of cervix ([place])   2. Vaginal spotting   3. Shortness of breath   4. Hypokalemia due to excessive gastrointestinal loss of potassium   5. Hypomagnesemia     Patient expressed understanding and was in agreement with this plan. She also understands that She can call clinic at any time with any questions, concerns, or complaints.   Thank you for allowing me to participate in the care of this very pleasant patient.   Beckey Rutter, DNP, AGNP-C [place] [facility] [place] (work cell) [phone] (office)   CC: Dr. Rogue Bussing

## 2019-02-19 NOTE — Progress Notes (Signed)
Pt reports fatigue, SOB with mild excretion, diarrhea WNL that is "pure water and mucousy" pt reports a new onset of vaginal bleeding that began this morning. Pt reports sharp abdominal pain which is WNL rating it a 8 out of 10, pt states home pain medication is effective and she last had it at 6:30 am 02/19/2019. Potassium 3.3 and Magnesium 1.5. MD aware, Per MD pt to receive 4 grams of Magnesium and one liter of NS over 2 hours, No other orders at this time. Beckey Rutter NP will see pt in infusion.

## 2019-02-20 ENCOUNTER — Other Ambulatory Visit: Payer: Self-pay | Admitting: *Deleted

## 2019-02-20 ENCOUNTER — Ambulatory Visit: Payer: Medicaid Other

## 2019-02-20 MED ORDER — RIVAROXABAN 20 MG PO TABS: 20.0000 mg | ORAL_TABLET | Freq: Every day | ORAL | 1 refills | Status: DC

## 2019-02-20 MED ORDER — OXYCODONE HCL ER 15 MG PO T12A: 15.0000 mg | EXTENDED_RELEASE_TABLET | Freq: Two times a day (BID) | ORAL | 0 refills | Status: DC

## 2019-02-24 ENCOUNTER — Other Ambulatory Visit: Payer: Self-pay | Admitting: *Deleted

## 2019-02-24 ENCOUNTER — Inpatient Hospital Stay: Payer: Medicaid Other

## 2019-02-24 ENCOUNTER — Other Ambulatory Visit: Payer: Self-pay

## 2019-02-24 ENCOUNTER — Encounter: Payer: Self-pay | Admitting: Oncology

## 2019-02-24 ENCOUNTER — Inpatient Hospital Stay (HOSPITAL_BASED_OUTPATIENT_CLINIC_OR_DEPARTMENT_OTHER): Payer: Medicaid Other | Admitting: Nurse Practitioner

## 2019-02-24 ENCOUNTER — Telehealth: Payer: Self-pay | Admitting: *Deleted

## 2019-02-24 ENCOUNTER — Encounter: Payer: Self-pay | Admitting: Nurse Practitioner

## 2019-02-24 VITALS — BP 114/75 | HR 91 | Temp 97.9°F | Resp 20

## 2019-02-24 DIAGNOSIS — Z5111 Encounter for antineoplastic chemotherapy: Secondary | ICD-10-CM | POA: Diagnosis not present

## 2019-02-24 DIAGNOSIS — C538 Malignant neoplasm of overlapping sites of cervix uteri: Secondary | ICD-10-CM | POA: Diagnosis not present

## 2019-02-24 DIAGNOSIS — R42 Dizziness and giddiness: Secondary | ICD-10-CM

## 2019-02-24 DIAGNOSIS — Z923 Personal history of irradiation: Secondary | ICD-10-CM

## 2019-02-24 DIAGNOSIS — K921 Melena: Secondary | ICD-10-CM

## 2019-02-24 DIAGNOSIS — R531 Weakness: Secondary | ICD-10-CM

## 2019-02-24 DIAGNOSIS — E876 Hypokalemia: Secondary | ICD-10-CM

## 2019-02-24 DIAGNOSIS — Z79899 Other long term (current) drug therapy: Secondary | ICD-10-CM

## 2019-02-24 DIAGNOSIS — R0602 Shortness of breath: Secondary | ICD-10-CM

## 2019-02-24 DIAGNOSIS — C7800 Secondary malignant neoplasm of unspecified lung: Secondary | ICD-10-CM | POA: Diagnosis not present

## 2019-02-24 LAB — COMPREHENSIVE METABOLIC PANEL
ALT: 35 U/L (ref 0–44)
AST: 29 U/L (ref 15–41)
Albumin: 3.8 g/dL (ref 3.5–5.0)
Alkaline Phosphatase: 126 U/L (ref 38–126)
Anion gap: 9 (ref 5–15)
BUN: 10 mg/dL (ref 6–20)
CO2: 24 mmol/L (ref 22–32)
Calcium: 8.9 mg/dL (ref 8.9–10.3)
Chloride: 104 mmol/L (ref 98–111)
Creatinine, Ser: 0.81 mg/dL (ref 0.44–1.00)
GFR calc Af Amer: >60 mL/min (ref >60)
GFR calc non Af Amer: >60 mL/min (ref >60)
Glucose, Bld: 127 mg/dL — ABNORMAL HIGH (ref 70–99)
Potassium: 3.6 mmol/L (ref 3.5–5.1)
Sodium: 137 mmol/L (ref 135–145)
Total Bilirubin: 0.4 mg/dL (ref 0.3–1.2)
Total Protein: 7.5 g/dL (ref 6.5–8.1)

## 2019-02-24 LAB — CBC WITH DIFFERENTIAL/PLATELET
Abs Immature Granulocytes: 0.01 10*3/uL (ref 0.00–0.07)
Basophils Absolute: 0 10*3/uL (ref 0.0–0.1)
Basophils Relative: 0 %
Eosinophils Absolute: 0 10*3/uL (ref 0.0–0.5)
Eosinophils Relative: 1 %
HCT: 28.4 % — ABNORMAL LOW (ref 36.0–46.0)
Hemoglobin: 9.4 g/dL — ABNORMAL LOW (ref 12.0–15.0)
Immature Granulocytes: 1 %
Lymphocytes Relative: 35 %
Lymphs Abs: 0.7 10*3/uL (ref 0.7–4.0)
MCH: 35.3 pg — ABNORMAL HIGH (ref 26.0–34.0)
MCHC: 33.1 g/dL (ref 30.0–36.0)
MCV: 106.8 fL — ABNORMAL HIGH (ref 80.0–100.0)
Monocytes Absolute: 0.2 10*3/uL (ref 0.1–1.0)
Monocytes Relative: 9 %
Neutro Abs: 1.1 10*3/uL — ABNORMAL LOW (ref 1.7–7.7)
Neutrophils Relative %: 54 %
Platelets: 134 10*3/uL — ABNORMAL LOW (ref 150–400)
RBC: 2.66 MIL/uL — ABNORMAL LOW (ref 3.87–5.11)
RDW: 16.1 % — ABNORMAL HIGH (ref 11.5–15.5)
WBC: 2.1 10*3/uL — ABNORMAL LOW (ref 4.0–10.5)
nRBC: 0 % (ref 0.0–0.2)

## 2019-02-24 LAB — MAGNESIUM: Magnesium: 1.5 mg/dL — ABNORMAL LOW (ref 1.7–2.4)

## 2019-02-24 LAB — SAMPLE TO BLOOD BANK

## 2019-02-24 MED ORDER — SODIUM CHLORIDE 0.9% FLUSH
10.0000 mL | Freq: Once | INTRAVENOUS | Status: AC
  Administered 2019-02-24: 10 mL via INTRAVENOUS
  Filled 2019-02-24: qty 10

## 2019-02-24 MED ORDER — HEPARIN SOD (PORK) LOCK FLUSH 100 UNIT/ML IV SOLN
INTRAVENOUS | Status: AC
  Filled 2019-02-24: qty 5

## 2019-02-24 MED ORDER — SODIUM CHLORIDE 0.9 % IV SOLN
Freq: Once | INTRAVENOUS | Status: AC
  Administered 2019-02-24: 1000 mL via INTRAVENOUS
  Filled 2019-02-24: qty 250

## 2019-02-24 MED ORDER — HEPARIN SOD (PORK) LOCK FLUSH 100 UNIT/ML IV SOLN
500.0000 [IU] | Freq: Once | INTRAVENOUS | Status: AC
  Administered 2019-02-24: 500 [IU] via INTRAVENOUS

## 2019-02-24 MED ORDER — OXYCODONE HCL 10 MG PO TABS: 10.0000 mg | ORAL_TABLET | Freq: Four times a day (QID) | ORAL | 0 refills | Status: DC | PRN

## 2019-02-24 NOTE — Telephone Encounter (Signed)
Patient accepts appointment for 1 PM today

## 2019-02-24 NOTE — Telephone Encounter (Signed)
My chart msg received from patient to request RF for oxycodone 10 mg.

## 2019-02-24 NOTE — Telephone Encounter (Signed)
Lauren, if you see the patient today in [facility], the patient will need a RF.

## 2019-02-24 NOTE — Progress Notes (Signed)
Symptom Management [place]  Telephone:(336) [phone] Fax:(336) [phone]  Patient Care Team: Arnetha Courser, MD as PCP - General (Family Medicine) Mellody Drown, MD as Consulting Physician (Obstetrics and Gynecology) Lenor Coffin, MD as Attending Physician (Gynecology) Lequita Asal, MD as Medical Oncologist (Hematology and Oncology) [name], MD as Consulting Physician (Gastroenterology) Michael Boston, MD as Consulting Physician (General Surgery) Lovell Sheehan, MD as Consulting Physician (Orthopedic Surgery) Clent Jacks, RN as Registered Nurse   Name of the patient: Joanna Hall  [id number]  October 06, 1966   Date of visit: 02/24/19  Diagnosis-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 Christene Slates at [facility] in [place], [place];No surgery- Chemo-RT;  # 2018-sep - lung nodules [in ]  # AUG 20th, 2019-#1 Carbo-Taxol s/p 6 cycles- [No avastin sec to blood clots]: Feb 2020-bilateral lung nodules with 1 cm in size. March 2020- finished carbo-taxol #7; poor tolerance to [place].  # April 15 th 2020- Start Taxol weekly x 3; one week OFF  # summer-/fall 2019-Bil PE/kidney infract; factor V Leiden Leiden - xarelto [small bowel obstruction]; moved to [facility]   #Fall 2019 PE/renal infarct on Xarelto-factor V Leiden  # NGS/[facility]- PDL CPS 15; NEG for other targets**  ------------------------------------------------------------- She was treated by  Decision was made to pursue concurrent chemotherapy (weekly cisplatin) and radiation.  She received treatment from 11/2016-05/2017.  01/2017 cisplatin x 2 and carboplatin x 1 (01/29/2017) due to ARF and XRT.  XRT was followed by T & O on 02/01/2017 and T & N 02/10/2017 and  02/20/2017.  Course was complicated by 80 pound weight loss, nausea, vomiting, electrolyte wasting (potassium and magnesium).  She describes that.  Is been sick constantly requiring at least 20 hospitalizations.  Follow-up CT chest and PET on 06/2017. Per patient, 'radiation worked' and no disease in the abdomen.  At that time she was noted to have lung nodules that were growing and follow-up imaging was scheduled for 10/2017.  She was admitted to hospital in [place] for small bowel obstruction which was managed conservatively and she was home for a week prior to traveling to [place] for Thanksgiving holiday where she has family.  She presented to ER in [place] on 08/2017 with nausea, vomiting, and lower abdominal pain.  Symptoms did not respond to conservative treatment.  CT on 08/26/2017 revealed small bowel obstruction with transition in the pelvis just superior to the uterus rather was a long segment of distal ileum with fatty wall thickening compatible with chronic inflammation and/or radiation enteritis. Imaging showed numerous pulmonary nodules consistent with metastatic disease. She underwent laparotomy and right ileocolectomy on 08/31/2017 at [facility].  Surgical findings revealed a thickened, matted, and scarred piece of distal small bowel close to the ileocecal valve.  She was discharged on 09/05/2017.  Pain markedly increased in intensity and imaging was performed on 09/11/2017 which revealed: Debris within the anterior abdominal wall incision concerning for infection versus packing material, s/p post ileo-colectomy with expected postoperative changes, mild colonic ileus, numerous pulmonary nodules highly concerning for metastatic disease, punctate nonobstructing nephrolithiasis.  Staples were removed and one was packed.  She was started on doxycycline.  Abdominal and pelvic CT without contrast on 09/11/2017 revealed debris within anterior abdominal wall incision concerning for  infection, versus  packing material.She is s/pileocolectomy with expected postoperative changes and mild colonic ileus. There were numerous pulmonary nodules highly concerning for metastatic diseaseand punctate nonobstructing nephrolithiasis. She was readmitted on 09/12/2017. She describes the onset of lower abdominal pain on 09/09/2017. Pain markedly increased in intensity on 09/11/2017.  Staples were removed and the wound packed. She was started on doxycycline.  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  PET scan was ordered to evaluate enlarging lung nodules with concern for recurrent cervical cancer but scan was delayed due to insurance and need to be performed in [place].  Presented to ER on 12/03/2017 for abdominal pain and emesis.  Imaging concerning for worsening possible uterine infarct and she was admitted to hospital.  Pelvic MRI was unremarkable.  Remote scarring type changes of uterus thought to be possibly related to radiation.  She was discharged on 12/08/2017.  Underwent endoscopy and colonoscopy on 12/20/2017.    On 02/22/2018 she underwent laser ablation of condylomata around the anus and vagina under anesthesia with Dr. Johney Maine.   04/15/2018: Chest, abdomen, and pelvis CTrevealed innumerable (>100) cavitary nodules scattered in the lungs, moderately enlarging compared to the 11/08/2017 PET-CT, suspicious for metastatic disease. One index node in the RLL measures 1.0 x 1.1 cm (previously 0.6 x 0.6 cm). There were no new nodules. There was an ill-defined wall thickening in the rectosigmoid with  surrounding stranding along fascia planes, probably sequela from prior radiation therapy. There was multilevel lumbar impingement due to spondylosis and degenerative disc disease. There was heterogeneous enhancement in the uterus (some possibly from prior radiation therapy).  04/23/2018:PET scan revealednumerous scattered solid and cavitary nodules in the lungs stable increased in size compared to the prior PET-CT from 11/08/2017.Largest nodule was 1.1 cm in the LUL (SUV 1.9).These demonstratedlow-grade metabolic activity up to a maximum SUV of about 2.3, increased from 11/08/2017.   Case was discussed at tumor board on 04/25/2018. Consensus to pursue CT-guided biopsy (05/06/18) which revealed: Metastatic adenocarcinoma, morphologically consistent with cervical adenocarcinoma.  She has history of chronic hepatitis C which is managed by GI.  Hepatitis C genotype is 2a/2c.  She receives B12 injections for history of B12 deficiency.  On 04/24/2018 she underwent left total knee replacement with Dr. [name]. --------------------------------------------------------   DIAGNOSIS: Cervical adenocarcinoma  STAGE: IV        ;GOALS: Palliative  CURRENT/MOST RECENT THERAPY : Taxol single agent      Malignant neoplasm of overlapping sites of cervix ([place])   01/29/2019 -  Chemotherapy    The patient had PACLitaxel (TAXOL) 156 mg in sodium chloride 0.9 % 250 mL chemo infusion (</= 77m/m2), 80 mg/m2 = 156 mg, Intravenous,  Once, 1 of 4 cycles Dose modification: 65 mg/m2 (original dose 80 mg/m2, Cycle 1, Reason: Provider Judgment) Administration: 156 mg (01/29/2019), 156 mg (02/05/2019), 126 mg (02/12/2019)  for chemotherapy treatment.      Interval history- KBetzaira Mentel 53year old female with above history of adenocarcinoma of the cervix with metastases to lung, currently on weekly Taxol, presents to symptom management clinic for rectal bleeding.   Hemorrhoids:  KLINDAY RHODESreports  evaluation of rectal bleeding.  Onset of symptoms was  abrupt starting earlier this morning and occurring once only with no additional symptoms since that time.  She describes symptoms as bleeding which only occurs with bowel movements.  Described as bright red blood.  She has not tried anything for her symptoms. She continues to feel fatigued and sleepy which improves with IV fluids. She requests IV fluids today and refill of breakthrough pain medication. She reports pain 6/10 today and overall stable and well controlled. She says she takes the short acting pain medication approximately 3 times a day. She was seen in Symptom Management Clinic last weekf or vaginal spotting which has not recurred.  She is chronically anticoagulated with Xarelto for her history of factor V Leiden.  ECOG FS:1 - Symptomatic but completely ambulatory  Review of systems- Review of Systems  Constitutional: Positive for malaise/fatigue. Negative for chills, fever and weight loss (weight essentially stable).  HENT: Negative for congestion and sore throat.   Eyes: Negative.   Respiratory: Positive for shortness of breath (per hpi). Negative for cough, hemoptysis, sputum production, wheezing and stridor.   Cardiovascular: Negative for chest pain, palpitations, orthopnea, claudication and leg swelling.  Gastrointestinal: Positive for abdominal pain (chronic; unchanged & well controlled), blood in stool (Per HPI) and diarrhea.  Genitourinary: Negative.  Negative for dysuria, frequency and urgency.  Musculoskeletal: Negative for falls and myalgias.  Skin: Negative.   Neurological: Negative for loss of consciousness, weakness and headaches.  Psychiatric/Behavioral: Positive for depression. The patient is nervous/anxious. The patient does not have insomnia.     Current treatment- weekly taxol  Allergies  Allergen Reactions  . Ketamine Anxiety and Other (See Comments)    Syncope episode/confusion     Past Medical History:   Diagnosis Date  . Abdominal pain 06/10/2018  . Abnormal cervical Papanicolaou smear 09/18/2017  . Anxiety   . Aortic atherosclerosis ([place])   . Arthritis    neck and knees  . 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 Blood transfusion without reported diagnosis   . Cervical cancer ([place]) 09/2016   mets lung  . Chronic anal fissure   . Chronic diarrhea   . Dyspnea   . Erosive gastropathy 09/18/2017  . Factor V Leiden mutation ([place])   . Fecal incontinence   . Genital warts   . GERD (gastroesophageal reflux disease)   . Heart murmur   . Hematochezia   . Hemorrhoids   . 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 Hepatitis, chronic ([place]) 05/05/2017  . History of cancer chemotherapy    completed 06/2017  . History of Clostridium difficile infection    while undergoing chemo.  Negative test 10/2017  . Ileocolic anastomotic leak   . 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 Intestinal infection due to Clostridium difficile 09/18/2017  . Macrocytic anemia with vitamin B12 deficiency   . Multiple gastric ulcers   . Nausea vomiting and diarrhea   . Pancolitis ([place]) 07/27/2018  . Perianal condylomata   . Pneumonia    History of  . Pulmonary nodules   . Rectal bleeding   . Small bowel obstruction ([place]) 08/2017  . Stiff neck    limited right turn  . Vitamin D deficiency     Past Surgical History:  Procedure Laterality Date  . CHOLECYSTECTOMY    . COLON SURGERY  08/2017   resection  . COLONOSCOPY WITH PROPOFOL N/A 12/20/2017  Procedure: COLONOSCOPY WITH PROPOFOL;  Surgeon: [name], MD;  Location: [facility] ENDOSCOPY;  Service: Gastroenterology;  Laterality: N/A;  . COLONOSCOPY WITH PROPOFOL N/A 07/30/2018   Procedure: COLONOSCOPY WITH PROPOFOL;  Surgeon: [name], MD;  Location: [facility] ENDOSCOPY;  Service: Gastroenterology;  Laterality: N/A;  . COLONOSCOPY WITH PROPOFOL N/A 10/10/2018   Procedure: COLONOSCOPY WITH  PROPOFOL;  Surgeon: Lucilla Lame, MD;  Location: [facility] ENDOSCOPY;  Service: Endoscopy;  Laterality: N/A;  . DIAGNOSTIC LAPAROSCOPY    . ESOPHAGOGASTRODUODENOSCOPY (EGD) WITH PROPOFOL N/A 12/20/2017   Procedure: ESOPHAGOGASTRODUODENOSCOPY (EGD) WITH PROPOFOL;  Surgeon: [name], MD;  Location: [place];  Service: Gastroenterology;  Laterality: N/A;  . ESOPHAGOGASTRODUODENOSCOPY (EGD) WITH PROPOFOL  07/30/2018   Procedure: ESOPHAGOGASTRODUODENOSCOPY (EGD) WITH PROPOFOL;  Surgeon: [name], MD;  Location: [facility] ENDOSCOPY;  Service: Gastroenterology;;  . Otho Darner SIGMOIDOSCOPY N/A 11/21/2018   Procedure: FLEXIBLE SIGMOIDOSCOPY;  Surgeon: [name], MD;  Location: [facility] ENDOSCOPY;  Service: Gastroenterology;  Laterality: N/A;  . LAPAROTOMY N/A 08/31/2017   Procedure: EXPLORATORY LAPAROTOMY for SBO, ileocolectomy, removal of piece of uterine wall;  Surgeon: [name], MD;  Location: [facility] ORS;  Service: General;  Laterality: N/A;  . LASER ABLATION CONDOLAMATA N/A 02/22/2018   Procedure: LASER ABLATION/REMOVAL OF FBPZWCHENID AROUND ANUS AND VAGINA;  Surgeon: Michael Boston, MD;  Location: [place];  Service: General;  Laterality: N/A;  . OOPHORECTOMY    . PORTA CATH INSERTION N/A 05/13/2018   Procedure: PORTA CATH INSERTION;  Surgeon: Katha Cabal, MD;  Location: [place] CV LAB;  Service: Cardiovascular;  Laterality: N/A;  . SMALL INTESTINE SURGERY    . TANDEM RING INSERTION     x3  . THORACOTOMY    . TOTAL KNEE ARTHROPLASTY Left 04/24/2018   Procedure: TOTAL KNEE ARTHROPLASTY;  Surgeon: Lovell Sheehan, MD;  Location: [facility] ORS;  Service: Orthopedics;  Laterality: Left;    Social History   Socioeconomic History  . Marital status: Divorced    Spouse name: Not on file  . Number of children: Not on file  . Years of education: Not on file  . Highest education level: Not on file  Occupational History  . Not on file  Social Needs  .  Financial resource strain: Not hard at all  . Food insecurity:    Worry: Never true    Inability: Never true  . Transportation needs:    Medical: No    Non-medical: No  Tobacco Use  . Smoking status: Former Smoker    Last attempt to quit: 10/16/2006    Years since quitting: 12.3  . Smokeless tobacco: Never Used  Substance and Sexual Activity  . Alcohol use: Not Currently    Frequency: Never    Comment: seldom  . Drug use: Yes    Types: Marijuana  . Sexual activity: Not Currently    Birth control/protection: Post-menopausal    Comment: Not Asked  Lifestyle  . Physical activity:    Days per week: Patient refused    Minutes per session: Patient refused  . Stress: Only a little  Relationships  . Social connections:    Talks on phone: Patient refused    Gets together: Patient refused    Attends religious service: Patient refused    Active member of club or organization: Patient refused    Attends meetings of clubs or organizations: Patient refused    Relationship status: Patient refused  . Intimate partner violence:    Fear of current or  ex partner: No    Emotionally abused: No    Physically abused: No    Forced sexual activity: No  Other Topics Concern  . Not on file  Social History Narrative  . Not on file    Family History  Problem Relation Age of Onset  . Hypertension Father   . Diabetes Father   . Alcohol abuse Daughter   . Hypertension Maternal Grandmother   . Diabetes Maternal Grandmother   . Diabetes Paternal Grandmother   . Hypertension Paternal Grandmother      Current Outpatient Medications:  .  amitriptyline (ELAVIL) 75 MG tablet, Take 1 tablet (75 mg total) by mouth at bedtime., Disp: 90 tablet, Rfl: 1 .  budesonide (ENTOCORT EC) 3 MG 24 hr capsule, Take 3 capsules (9 mg total) by mouth daily., Disp: 30 capsule, Rfl: 0 .  Calcium Carb-Cholecalciferol (CALCIUM 500 +D) 500-400 MG-UNIT TABS, Take 2 tablets by mouth daily., Disp: , Rfl:  .   diphenoxylate-atropine (LOMOTIL) 2.5-0.025 MG tablet, TAKE 2 TABLETS BY MOUTH FOUR TIMES A DAY, Disp: 240 tablet, Rfl: 0 .  famotidine (PEPCID) 20 MG tablet, Take 1 tablet (20 mg total) by mouth 2 (two) times daily as needed for heartburn or indigestion., Disp: , Rfl:  .  hyoscyamine (LEVBID) 0.375 MG 12 hr tablet, Take 1 tablet (0.375 mg total)by mouth 2 (two) times daily., Disp: 60 tablet, Rfl: 0 .  meclizine (ANTIVERT) 25 MG tablet, Take 1 tablet (25 mg total) by mouth 2 (two) times daily as needed for dizziness., Disp: 15 tablet, Rfl: 0 .  Multiple Vitamins-Minerals (MULTIVITAMIN WITH MINERALS) tablet, Take 1 tablet by mouth daily., Disp: 30 tablet, Rfl: 0 .  ondansetron (ZOFRAN ODT) 4 MG disintegrating tablet, Take 1 tablet (4 mg total) by mouth every 8 (eight) hours as needed for nausea or vomiting., Disp: 20 tablet, Rfl: 0 .  oxyCODONE (OXYCONTIN) 15 mg 12 hr tablet, Take 1 tablet (15 mg total) by mouth every 12 (twelve) hours., Disp: 30 tablet, Rfl: 0 .  Oxycodone HCl 10 MG TABS, Take 1 tablet (10 mg total) by mouth every 6 (six) hours as needed (for breakthrough pain)., Disp: 90 tablet, Rfl: 0 .  pantoprazole (PROTONIX) 40 MG tablet, Take 1 tablet (40 mg total) by mouth daily., Disp: 90 tablet, Rfl: 0 .  potassium chloride SA (K-DUR,KLOR-CON) 20 MEQ tablet, Take 1 tablet (20 mEq total) by mouth daily., Disp: 30 tablet, Rfl: 3 .  promethazine (PHENERGAN) 25 MG tablet, Take 1 tablet (25 mg total) by mouth every 8 (eight) hours as needed for nausea or vomiting., Disp: 30 tablet, Rfl: 3 .  rivaroxaban (XARELTO) 20 MG TABS tablet, Take 1 tablet (20 mg total) by mouth daily with supper., Disp: 30 tablet, Rfl: 1 .  VENTOLIN HFA 108 (90 Base) MCG/ACT inhaler, Inhale 1-2 puffs into the lungs every 4 (four) hours as needed for shortness of breath., Disp: 1 Inhaler, Rfl: 1 .  VIBERZI 100 MG TABS, TAKE 1 TABLET BY MOUTH TWICE DAILY WITH A MEAL, Disp: 60 tablet, Rfl: 0 No current facility-administered  medications for this visit.   Facility-Administered Medications Ordered in Other Visits:  .  heparin lock flush 100 unit/mL, 500 Units, Intravenous, Once, Corcoran, Melissa C, MD .  sodium chloride 0.9 % 1,000 mL with potassium chloride 20 mEq infusion, , Intravenous, Once, Honor Loh E, NP .  sodium chloride flush (NS) 0.9 % injection 10 mL, 10 mL, Intravenous, Once, Borders, Kirt Boys, NP  Physical exam:  Vitals:   02/24/19 1318  BP: 114/75  Pulse: 91  Resp: 20  Temp: 97.9 F (36.6 C)  TempSrc: Tympanic    Physical Exam Constitutional:      Comments: Chronically ill appearing; unaccompanied; seen in infusion suite  HENT:     Head: Atraumatic.     Comments: Alopecia Eyes:     General: No scleral icterus.    Conjunctiva/sclera: Conjunctivae normal.  Cardiovascular:     Rate and Rhythm: Normal rate and regular rhythm.     Heart sounds: Normal heart sounds.  Pulmonary:     Effort: Pulmonary effort is normal.     Breath sounds: Normal breath sounds.  Abdominal:     General: There is no distension.     Palpations: Abdomen is soft.     Tenderness: There is no abdominal tenderness.  Genitourinary:    Comments: Pelvic deferred Skin:    General: Skin is warm and dry.     Coloration: Skin is pale.  Neurological:     Mental Status: She is alert and oriented to person, place, and time.  Psychiatric:        Mood and Affect: Mood is anxious.        Behavior: Behavior normal.      CMP Latest Ref Rng & Units 02/24/2019  Glucose 70 - 99 mg/dL 127(H)  BUN 6 - 20 mg/dL 10  Creatinine 0.44 - 1.00 mg/dL 0.81  Sodium 135 - 145 mmol/L 137  Potassium 3.5 - 5.1 mmol/L 3.6  Chloride 98 - 111 mmol/L 104  CO2 22 - 32 mmol/L 24  Calcium 8.9 - 10.3 mg/dL 8.9  Total Protein 6.5 - 8.1 g/dL 7.5  Total Bilirubin 0.3 - 1.2 mg/dL 0.4  Alkaline Phos 38 - 126 U/L 126  AST 15 - 41 U/L 29  ALT 0 - 44 U/L 35   CBC Latest Ref Rng & Units 02/24/2019  WBC 4.0 - 10.5 K/uL 2.1(L)  Hemoglobin 12.0  - 15.0 g/dL 9.4(L)  Hematocrit 36.0 - 46.0 % 28.4(L)  Platelets 150 - 400 K/uL 134(L)    No images are attached to the encounter.  Ct Head Wo Contrast  Result Date: 01/26/2019 CLINICAL DATA:  Altered level of consciousness. EXAM: CT HEAD WITHOUT CONTRAST TECHNIQUE: Contiguous axial images were obtained from the base of the skull through the vertex without intravenous contrast. COMPARISON:  None. FINDINGS: Brain: No evidence of acute infarction, hemorrhage, hydrocephalus, extra-axial collection or mass lesion/mass effect. Vascular: No hyperdense vessel or unexpected calcification. Skull: Normal. Negative for fracture or focal lesion. Sinuses/Orbits: No acute finding. Other: None. IMPRESSION: Normal head CT. Electronically Signed   By: Marijo Conception, M.D.   On: 01/26/2019 22:19   Dg Chest Portable 1 View  Result Date: 01/26/2019 CLINICAL DATA:  Current history of metastatic ovarian cancer, presenting with acute cough, generalized weakness and nausea. EXAM: PORTABLE CHEST 1 VIEW COMPARISON:  12/25/2018 and earlier, including CT chest 09/30/2018. FINDINGS: Cardiac silhouette normal in size, unchanged. Thoracic aorta mildly atherosclerotic, unchanged. Hilar and mediastinal contours otherwise unremarkable. The multiple small nodules throughout both lungs identified on the prior CT are inconspicuous on the portable x-ray. No new or suspicious findings in either lung. No pleural effusions. RIGHT jugular Port-A-Cath tip projects over the mid SVC, unchanged. IMPRESSION: 1. No acute cardiopulmonary disease. 2. The multiple small nodules throughout both lungs identified on the prior CT are inconspicuous on the portable x-ray. Electronically Signed   By: Evangeline Dakin M.D.   On:  01/26/2019 16:20    Assessment and plan- Patient is a 53 y.o. female diagnosed with cervical adenocarcinoma with metastases to lung who presents to symptom management clinic for rectal bleeding.  1.  Recurrent stage IB1 cervical  adenocarcinoma with metastases to lung-s/p aborted radical hysterectomy due to infected adnexa 11/2016 at [facility] followed by chemo-radiation with apparent complete response.  She developed a bowel obstruction and underwent ileocolectomy at [facility] 08/2017.  Started carbo-Taxol on 06/04/2018.  Tolerated poorly due to ongoing chronic diarrhea and multiple GI issues.  Currently on single agent Taxol-weekly.  Carbo dropped due to intolerable side effects.  CT abdomen pelvis 01/2019 showed stable disease.  Most recent cycle of Taxol dose reduced (65 mg/m) due to [place] 1.2 and platelets 88.  Her adenocarcinoma was PD-L1 positive and Keytruda could be considered.   2.  Hematochezia- etiology unclear but likely secondary to hx of IBS and chronic anticoagulation. Discussed that benefit of anticoagulation outweighs risk at this point, given that she is asymptomatic, counts improved (hmg 9.4/Hct 28.4- no transfusion today), and symptoms have not recurred. IV fluids in clinic today. She is scheduled to see Dr. Merleen Milliner with [facility] on 03/04/2019. ER for severe symptoms which were discussed in detail.   3. Vaginal bleeding-resolved.  Continue to monitor  4.  Shortness of breath-suspect related to general deconditioning and known lung disease though in setting of her untreated hep c I question pulmonary hypertension. Previously though, cardiac work-up has been negative. Last echo was 09/14/2017 with LVEF 60-65%.  If persistent symptoms, would consider repeating echo vs referral to cardiology.  5. Hypokalemia- Potassium 3.6 today. Likely secondary to chronic diarrhea and poor oral intake. Per Dr. Rogue Bussing, electrolyte replacement today in clinic. Continue oral potassium as prescribed.   6. Hypomagnesemia- Mag 1.5 today, slightly decreased.  Essentially baseline for patient.  Asymptomatic.  Continue to monitor.  rtc as scheduled on 02/26/2019 for repeat labs and consideration of weekly taxol with Dr. Rogue Bussing. If persistent or  worsening symptoms please return to clinic sooner.   Visit Diagnosis 1. Hematochezia   2. Weakness   3. Dizziness   4. SOB (shortness of breath)   5. Primary malignant neoplasm of overlapping sites of cervix [facility])     Patient expressed understanding and was in agreement with this plan. She also understands that She can call clinic at any time with any questions, concerns, or complaints.   Thank you for allowing me to participate in the care of this very pleasant patient.   Beckey Rutter, DNP, AGNP-C [place] [facility] [place] [facility] (work cell) [phone] (office)   CC: Dr. Rogue Bussing

## 2019-02-24 NOTE — Telephone Encounter (Signed)
Patient called reporting that she is dizzy, out of breath and having blood in her stools. She is requesting to come in for IV fluids today. Please advise

## 2019-02-25 ENCOUNTER — Other Ambulatory Visit: Payer: Self-pay | Admitting: *Deleted

## 2019-02-25 ENCOUNTER — Encounter: Payer: Self-pay | Admitting: Nurse Practitioner

## 2019-02-25 NOTE — Telephone Encounter (Signed)
I saw the patient for similar symptoms yesterday. Counts were stable and I don't think symptoms will improve with IV fluids. I'd like for her to see her GI doctor but I can call and discuss this with her and Dr. Rogue Bussing agrees with this plan.

## 2019-02-25 NOTE — Telephone Encounter (Signed)
I called patient. She said this message was from yesterday. My mistake! She says that she's had no other episodes of rectal bleeding and feels at baseline today. She is scheduled to see Dr. Rogue Bussing for consideration of weekly taxol tomorrow.

## 2019-02-26 ENCOUNTER — Inpatient Hospital Stay: Payer: Medicaid Other

## 2019-02-26 ENCOUNTER — Encounter: Payer: Self-pay | Admitting: Internal Medicine

## 2019-02-26 ENCOUNTER — Inpatient Hospital Stay (HOSPITAL_BASED_OUTPATIENT_CLINIC_OR_DEPARTMENT_OTHER): Payer: Medicaid Other | Admitting: Internal Medicine

## 2019-02-26 ENCOUNTER — Other Ambulatory Visit: Payer: Self-pay

## 2019-02-26 ENCOUNTER — Inpatient Hospital Stay: Payer: Medicaid Other | Admitting: Internal Medicine

## 2019-02-26 VITALS — BP 105/71 | HR 87 | Temp 96.8°F | Resp 20 | Ht 69.0 in | Wt 173.5 lb

## 2019-02-26 DIAGNOSIS — C538 Malignant neoplasm of overlapping sites of cervix uteri: Secondary | ICD-10-CM

## 2019-02-26 DIAGNOSIS — E876 Hypokalemia: Secondary | ICD-10-CM

## 2019-02-26 DIAGNOSIS — Z79899 Other long term (current) drug therapy: Secondary | ICD-10-CM

## 2019-02-26 DIAGNOSIS — Z5111 Encounter for antineoplastic chemotherapy: Secondary | ICD-10-CM | POA: Diagnosis not present

## 2019-02-26 DIAGNOSIS — C7800 Secondary malignant neoplasm of unspecified lung: Secondary | ICD-10-CM

## 2019-02-26 DIAGNOSIS — Z923 Personal history of irradiation: Secondary | ICD-10-CM

## 2019-02-26 DIAGNOSIS — Z7189 Other specified counseling: Secondary | ICD-10-CM

## 2019-02-26 MED ORDER — DEXAMETHASONE SODIUM PHOSPHATE 10 MG/ML IJ SOLN
10.0000 mg | Freq: Once | INTRAMUSCULAR | Status: AC
  Administered 2019-02-26: 10 mg via INTRAVENOUS
  Filled 2019-02-26: qty 1

## 2019-02-26 MED ORDER — MAGNESIUM SULFATE 4 GM/100ML IV SOLN
4.0000 g | Freq: Once | INTRAVENOUS | Status: AC
  Administered 2019-02-26: 12:00:00 4 g via INTRAVENOUS
  Filled 2019-02-26: qty 100

## 2019-02-26 MED ORDER — HEPARIN SOD (PORK) LOCK FLUSH 100 UNIT/ML IV SOLN
500.0000 [IU] | Freq: Once | INTRAVENOUS | Status: AC | PRN
  Administered 2019-02-26: 500 [IU]
  Filled 2019-02-26: qty 5

## 2019-02-26 MED ORDER — DIPHENHYDRAMINE HCL 50 MG/ML IJ SOLN
50.0000 mg | Freq: Once | INTRAMUSCULAR | Status: AC
  Administered 2019-02-26: 50 mg via INTRAVENOUS
  Filled 2019-02-26: qty 1

## 2019-02-26 MED ORDER — SODIUM CHLORIDE 0.9 % IV SOLN
Freq: Once | INTRAVENOUS | Status: AC
  Administered 2019-02-26: 11:00:00 via INTRAVENOUS
  Filled 2019-02-26: qty 250

## 2019-02-26 MED ORDER — FAMOTIDINE IN NACL 20-0.9 MG/50ML-% IV SOLN
20.0000 mg | Freq: Once | INTRAVENOUS | Status: AC
  Administered 2019-02-26: 20 mg via INTRAVENOUS
  Filled 2019-02-26: qty 50

## 2019-02-26 MED ORDER — SODIUM CHLORIDE 0.9 % IV SOLN
65.0000 mg/m2 | Freq: Once | INTRAVENOUS | Status: AC
  Administered 2019-02-26: 126 mg via INTRAVENOUS
  Filled 2019-02-26: qty 21

## 2019-02-26 NOTE — Progress Notes (Signed)
[facility] NOTE  Patient Care Team: Lada, Satira Anis, MD as PCP - General (Family Medicine) Mellody Drown, MD as Consulting Physician (Obstetrics and Gynecology) Lenor Coffin, MD as Attending Physician (Gynecology) Lequita Asal, MD as Medical Oncologist (Hematology and Oncology) [name], MD as Consulting Physician (Gastroenterology) Michael Boston, MD as Consulting Physician (General Surgery) Lovell Sheehan, MD as Consulting Physician (Orthopedic Surgery) Clent Jacks, RN as Registered Nurse  CHIEF COMPLAINTS/PURPOSE OF CONSULTATION: Cervical cancer  #  Oncology History   # dec 2017- CERVICAL ADENO CA [[place]]; Stage IB [Dr. Christene Slates at [facility] in [place], [place];No surgery- Chemo-RT;  # 2018-sep - lung nodules [in [place]]  # AUG 20th, 2019-#1 Carbo-Taxol s/p 6 cycles- [No avastin sec to blood clots]: Feb 2020-bilateral lung nodules with 1 cm in size. March 2020- finished carbo-taxol #7; poor tolerance to [place].  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  She received treatment from 11/2016-05/2017.  01/2017 cisplatin x 2 and carboplatin x 1 (01/29/2017) due to ARF and XRT.  XRT was followed by T & O on 02/01/2017 and T & N 02/10/2017 and 02/20/2017.  Course was complicated by 80 pound weight loss, nausea, vomiting, electrolyte wasting (potassium and magnesium).  She describes that.  Is been sick constantly requiring at least 20 hospitalizations.  Follow-up CT chest and PET on 06/2017.  Per patient, 'radiation worked' and no disease in the abdomen.  At that time she was noted to have lung nodules that were growing and follow-up imaging was scheduled for 10/2017.  She was admitted to hospital in [place] for small bowel obstruction which was managed conservatively and she was home for a week prior to traveling to [place] for Thanksgiving holiday where she has family.  She presented to ER in [place] on 08/2017 with nausea, vomiting, and lower abdominal pain.  Symptoms did not respond to conservative treatment.  CT on 08/26/2017 revealed small bowel obstruction with transition in the pelvis just superior to the uterus rather was a long segment of distal ileum with fatty wall thickening compatible with chronic inflammation and/or radiation enteritis. Imaging showed numerous pulmonary nodules consistent with metastatic disease. She underwent laparotomy and right ileocolectomy on 08/31/2017 at [facility].  Surgical findings revealed a thickened, matted, and scarred piece of distal small bowel close to the ileocecal valve.  She was discharged on 09/05/2017.  Pain markedly increased in intensity and imaging was performed on 09/11/2017 which revealed: Debris within the anterior abdominal wall incision concerning for infection versus packing material, s/p post ileo-colectomy with expected postoperative changes, mild colonic ileus, numerous pulmonary nodules highly concerning for metastatic disease, punctate nonobstructing nephrolithiasis.  Staples were removed and one was packed.  She was started on doxycycline.  Abdominal and pelvic CT without contrast on 09/11/2017 revealed debris within anterior abdominal wall incision concerning for infection, versus packing material.She is s/pileocolectomy with expected postoperative changes and mild colonic ileus. There were numerous pulmonary nodules highly concerning for metastatic diseaseand punctate nonobstructing nephrolithiasis. She  was readmitted on 09/12/2017. She describes the onset of lower abdominal  pain on 09/09/2017. Pain markedly increased in intensity on 09/11/2017.  Staples were removed and the wound packed. She was started on doxycycline.  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  PET scan was ordered to evaluate enlarging lung nodules with concern for recurrent cervical cancer but scan was delayed due to insurance and need to be performed in [place].  Presented to ER on 12/03/2017 for abdominal pain and emesis.  Imaging concerning for worsening possible uterine infarct and she was admitted to hospital.  Pelvic MRI was unremarkable.  Remote scarring type changes of uterus thought to be possibly related to radiation.  She was discharged on 12/08/2017.  Underwent endoscopy and colonoscopy on 12/20/2017.    On 02/22/2018 she underwent laser ablation of condylomata around the anus and vagina under anesthesia with Dr. Johney Maine.   04/15/2018: Chest, abdomen, and pelvis CTrevealed innumerable (>100) cavitary nodules scattered in the lungs, moderately enlarging compared to the 11/08/2017 PET-CT, suspicious for metastatic disease. One index node in the RLL measures 1.0 x 1.1 cm (previously 0.6 x 0.6 cm). There were no new nodules. There was an ill-defined wall thickening in the rectosigmoid with surrounding stranding along fascia planes, probably sequela from prior radiation therapy. There was multilevel lumbar impingement due to spondylosis and degenerative disc disease. There was heterogeneous enhancement in the uterus (some possibly from  prior radiation therapy).  04/23/2018:PET scan revealednumerous scattered solid and cavitary nodules in the lungs stable increased in size compared to the prior PET-CT from 11/08/2017.Largest nodule was 1.1 cm in the LUL (SUV 1.9).These demonstratedlow-grade metabolic activity up to a maximum SUV of about 2.3, increased from 11/08/2017.   Case was discussed at tumor board on 04/25/2018. Consensus to pursue CT-guided biopsy (05/06/18) which revealed: Metastatic adenocarcinoma, morphologically consistent with cervical adenocarcinoma.  She has history of chronic hepatitis C which is managed by GI.  Hepatitis C genotype is 2a/2c.  She receives B12 injections for history of B12 deficiency.  On 04/24/2018 she underwent left total knee replacement with Dr. [name]. --------------------------------------------------------   DIAGNOSIS: Cervical adenocarcinoma  STAGE: IV        ;GOALS: Palliative  CURRENT/MOST RECENT THERAPY : Taxol single agent      Malignant neoplasm of overlapping sites of cervix ([place])   01/29/2019 -  Chemotherapy    The patient had PACLitaxel (TAXOL) 156 mg in sodium chloride 0.9 % 250 mL chemo infusion (</= 51m/m2), 80 mg/m2 = 156 mg, Intravenous,  Once, 2 of 4 cycles Dose modification: 65 mg/m2 (original dose 80 mg/m2, Cycle 1, Reason: Provider Judgment) Administration: 156 mg (01/29/2019), 156 mg (02/05/2019), 126 mg (02/12/2019)  for chemotherapy treatment.       HISTORY OF PRESENTING ILLNESS:  KDARNESHIA DEMARY89y.o.  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  In the interim patient was evaluated in the symptom management clinic for vaginal bleeding; also separate episode of rectal bleeding.  Currently the vaginal bleeding that has been bleeding is improved/resolved.  Denies to have chronic abdominal pain which is not any worse.  Denies any tingling or numbness.  Denies any chest pain.  Denies  any worsening shortness of breath.   Review of Systems  Constitutional: Positive for malaise/fatigue. Negative for chills, diaphoresis, fever and weight loss.  HENT: Negative for nosebleeds and sore throat.   Eyes: Negative for double vision.  Respiratory: Negative for cough, hemoptysis, sputum production, shortness of breath and wheezing.   Cardiovascular: Negative for chest pain, palpitations, orthopnea and leg swelling.  Gastrointestinal: Positive for abdominal pain. Negative for blood in stool, constipation, diarrhea, heartburn, melena, nausea and vomiting.  Genitourinary: Negative for dysuria, frequency and urgency.  Musculoskeletal: Negative for back pain and joint pain.  Skin: Negative.  Negative for itching and rash.  Neurological: Negative for dizziness, tingling, focal weakness, weakness and headaches.  Endo/Heme/Allergies: Does not bruise/bleed easily.  Psychiatric/Behavioral: Negative for depression. The patient is not nervous/anxious and does not have insomnia.      MEDICAL HISTORY:  Past Medical History:  Diagnosis Date  . Abdominal pain 06/10/2018  . Abnormal cervical Papanicolaou smear 09/18/2017  . Anxiety   . Aortic atherosclerosis ([place])   . Arthritis    neck and knees  . 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 Blood transfusion without reported diagnosis   . Cervical cancer ([place]) 09/2016   mets lung  . Chronic anal fissure   . Chronic diarrhea   . Dyspnea   . Erosive gastropathy 09/18/2017  . Factor V Leiden mutation ([place])   . Fecal incontinence   . Genital warts   . GERD (gastroesophageal reflux disease)   . Heart murmur   . Hematochezia   . Hemorrhoids   . 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 Hepatitis, chronic ([place]) 05/05/2017  . History of cancer chemotherapy    completed 06/2017  . History of Clostridium difficile infection    while undergoing chemo.  Negative test 10/2017  . Ileocolic anastomotic leak    . 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 Intestinal infection due to Clostridium difficile 09/18/2017  . Macrocytic anemia with vitamin B12 deficiency   . Multiple gastric ulcers   . Nausea vomiting and diarrhea   . Pancolitis ([place]) 07/27/2018  . Perianal condylomata   . Pneumonia    History of  . Pulmonary nodules   . Rectal bleeding   . Small bowel obstruction ([place]) 08/2017  . Stiff neck    limited right turn  . Vitamin D deficiency     SURGICAL HISTORY: Past Surgical History:  Procedure Laterality Date  . CHOLECYSTECTOMY    . COLON SURGERY  08/2017   resection  . COLONOSCOPY WITH PROPOFOL N/A 12/20/2017   Procedure: COLONOSCOPY WITH PROPOFOL;  Surgeon: [name], MD;  Location[facility] ENDOSCOPY;  Service: Gastroenterology;  Laterality: N/A;  . COLONOSCOPY WITH PROPOFOL N/A 07/30/2018   Procedure: COLONOSCOPY WITH PROPOFOL;  Surgeon: [name], MD;  Location: [facility] ENDOSCOPY;  Service: Gastroenterology;  Laterality: N/A;  . COLONOSCOPY WITH PROPOFOL N/A 10/10/2018   Procedure: COLONOSCOPY WITH PROPOFOL;  Surgeon: Lucilla Lame, MD;  Location[facility] ENDOSCOPY;  Service: Endoscopy;  Laterality: N/A;  . DIAGNOSTIC LAPAROSCOPY    . ESOPHAGOGASTRODUODENOSCOPY (EGD) WITH PROPOFOL N/A 12/20/2017   Procedure: ESOPHAGOGASTRODUODENOSCOPY (EGD) WITH PROPOFOL;  Surgeon: [name], MD;  Location: [place];  Service: Gastroenterology;  Laterality: N/A;  . ESOPHAGOGASTRODUODENOSCOPY (EGD) WITH PROPOFOL  07/30/2018   Procedure: ESOPHAGOGASTRODUODENOSCOPY (EGD) WITH PROPOFOL;  Surgeon: [name], MD;  Location: [facility]  ENDOSCOPY;  Service: Gastroenterology;;  . Otho Darner SIGMOIDOSCOPY N/A 11/21/2018   Procedure: FLEXIBLE SIGMOIDOSCOPY;  Surgeon: [name], MD;  Location: [facility] ENDOSCOPY;  Service: Gastroenterology;  Laterality: N/A;  . LAPAROTOMY N/A 08/31/2017   Procedure: EXPLORATORY LAPAROTOMY for SBO, ileocolectomy, removal of piece  of uterine wall;  Surgeon: [name], MD;  Location: [facility] ORS;  Service: General;  Laterality: N/A;  . LASER ABLATION CONDOLAMATA N/A 02/22/2018   Procedure: LASER ABLATION/REMOVAL OF URKYHCWCBJS AROUND ANUS AND VAGINA;  Surgeon: Michael Boston, MD;  Location: [place];  Service: General;  Laterality: N/A;  . OOPHORECTOMY    . PORTA CATH INSERTION N/A 05/13/2018   Procedure: PORTA CATH INSERTION;  Surgeon: Katha Cabal, MD;  Location: [facility] CV LAB;  Service: Cardiovascular;  Laterality: N/A;  . SMALL INTESTINE SURGERY    . TANDEM RING INSERTION     x3  . THORACOTOMY    . TOTAL KNEE ARTHROPLASTY Left 04/24/2018   Procedure: TOTAL KNEE ARTHROPLASTY;  Surgeon: Lovell Sheehan, MD;  Location: [facility] ORS;  Service: Orthopedics;  Laterality: Left;    SOCIAL HISTORY: Social History   Socioeconomic History  . Marital status: Divorced    Spouse name: Not on file  . Number of children: Not on file  . Years of education: Not on file  . Highest education level: Not on file  Occupational History  . Not on file  Social Needs  . Financial resource strain: Not hard at all  . Food insecurity:    Worry: Never true    Inability: Never true  . Transportation needs:    Medical: No    Non-medical: No  Tobacco Use  . Smoking status: Former Smoker    Last attempt to quit: 10/16/2006    Years since quitting: 12.3  . Smokeless tobacco: Never Used  Substance and Sexual Activity  . Alcohol use: Not Currently    Frequency: Never    Comment: seldom  . Drug use: Yes    Types: Marijuana  . Sexual activity: Not Currently    Birth control/protection: Post-menopausal    Comment: Not Asked  Lifestyle  . Physical activity:    Days per week: Patient refused    Minutes per session: Patient refused  . Stress: Only a little  Relationships  . Social connections:    Talks on phone: Patient refused    Gets together: Patient refused    Attends religious service: Patient refused     Active member of club or organization: Patient refused    Attends meetings of clubs or organizations: Patient refused    Relationship status: Patient refused  . Intimate partner violence:    Fear of current or ex partner: No    Emotionally abused: No    Physically abused: No    Forced sexual activity: No  Other Topics Concern  . Not on file  Social History Narrative  . Not on file    FAMILY HISTORY: Family History  Problem Relation Age of Onset  . Hypertension Father   . Diabetes Father   . Alcohol abuse Daughter   . Hypertension Maternal Grandmother   . Diabetes Maternal Grandmother   . Diabetes Paternal Grandmother   . Hypertension Paternal Grandmother     ALLERGIES:  is allergic to ketamine.  MEDICATIONS:  Current Outpatient Medications  Medication Sig Dispense Refill  . amitriptyline (ELAVIL) 75 MG tablet Take 1 tablet (75 mg total) by mouth at bedtime. 90 tablet 1  . budesonide (  ENTOCORT EC) 3 MG 24 hr capsule Take 3 capsules (9 mg total) by mouth daily. 30 capsule 0  . Calcium Carb-Cholecalciferol (CALCIUM 500 +D) 500-400 MG-UNIT TABS Take 2 tablets by mouth daily.    . diphenoxylate-atropine (LOMOTIL) 2.5-0.025 MG tablet TAKE 2 TABLETS BY MOUTH FOUR TIMES A DAY 240 tablet 0  . famotidine (PEPCID) 20 MG tablet Take 1 tablet (20 mg total) by mouth 2 (two) times daily as needed for heartburn or indigestion.    . hyoscyamine (LEVBID) 0.375 MG 12 hr tablet Take 1 tablet (0.375 mg total)by mouth 2 (two) times daily. 60 tablet 0  . meclizine (ANTIVERT) 25 MG tablet Take 1 tablet (25 mg total) by mouth 2 (two) times daily as needed for dizziness. 15 tablet 0  . Multiple Vitamins-Minerals (MULTIVITAMIN WITH MINERALS) tablet Take 1 tablet by mouth daily. 30 tablet 0  . ondansetron (ZOFRAN ODT) 4 MG disintegrating tablet Take 1 tablet (4 mg total) by mouth every 8 (eight) hours as needed for nausea or vomiting. 20 tablet 0  . oxyCODONE (OXYCONTIN) 15 mg 12 hr tablet Take 1  tablet (15 mg total) by mouth every 12 (twelve) hours. 30 tablet 0  . Oxycodone HCl 10 MG TABS Take 1 tablet (10 mg total) by mouth every 6 (six) hours as needed (for breakthrough pain). 90 tablet 0  . pantoprazole (PROTONIX) 40 MG tablet Take 1 tablet (40 mg total) by mouth daily. 90 tablet 0  . potassium chloride SA (K-DUR,KLOR-CON) 20 MEQ tablet Take 1 tablet (20 mEq total) by mouth daily. 30 tablet 3  . promethazine (PHENERGAN) 25 MG tablet Take 1 tablet (25 mg total) by mouth every 8 (eight) hours as needed for nausea or vomiting. 30 tablet 3  . rivaroxaban (XARELTO) 20 MG TABS tablet Take 1 tablet (20 mg total) by mouth daily with supper. 30 tablet 1  . VENTOLIN HFA 108 (90 Base) MCG/ACT inhaler Inhale 1-2 puffs into the lungs every 4 (four) hours as needed for shortness of breath. 1 Inhaler 1  . VIBERZI 100 MG TABS TAKE 1 TABLET BY MOUTH TWICE DAILY WITH A MEAL 60 tablet 0   No current facility-administered medications for this visit.    Facility-Administered Medications Ordered in Other Visits  Medication Dose Route Frequency Provider Last Rate Last Dose  . heparin lock flush 100 unit/mL  500 Units Intravenous Once Corcoran, Melissa C, MD      . heparin lock flush 100 unit/mL  500 Units Intracatheter Once PRN Charlaine Dalton R, MD      . magnesium sulfate IVPB 4 g 100 mL  4 g Intravenous Once Cammie Sickle, MD 50 mL/hr at 02/26/19 1137 4 g at 02/26/19 1137  . PACLitaxel (TAXOL) 126 mg in sodium chloride 0.9 % 250 mL chemo infusion (</= 41m/m2)  65 mg/m2 (Treatment Plan Recorded) Intravenous Once BCharlaine DaltonR, MD      . sodium chloride 0.9 % 1,000 mL with potassium chloride 20 mEq infusion   Intravenous Once GHonor LohE, NP      . sodium chloride flush (NS) 0.9 % injection 10 mL  10 mL Intravenous Once Borders, JKirt Boys NP          .  PHYSICAL EXAMINATION: ECOG PERFORMANCE STATUS: 1 - Symptomatic but completely ambulatory  Vitals:   02/26/19 1008  BP:  105/71  Pulse: 87  Resp: 20  Temp: (!) 96.8 F (36 C)   Filed Weights   02/26/19 1008  Weight: 173  lb 8 oz (78.7 kg)    Physical Exam  Constitutional: She is oriented to person, place, and time and well-developed, well-nourished, and in no distress.  Alone.  Walking herself.   HENT:  Head: Normocephalic and atraumatic.  Mouth/Throat: Oropharynx is clear and moist. No oropharyngeal exudate.  Eyes: Pupils are equal, round, and reactive to light.  Neck: Normal range of motion. Neck supple.  Cardiovascular: Normal rate and regular rhythm.  Pulmonary/Chest: Effort normal and breath sounds normal. No respiratory distress. She has no wheezes.  Abdominal: Soft. Bowel sounds are normal. She exhibits no distension and no mass. There is no rebound and no guarding.  Musculoskeletal: Normal range of motion.        General: No tenderness or edema.  Neurological: She is alert and oriented to person, place, and time.  Skin: Skin is warm.  Psychiatric: Affect normal.     LABORATORY DATA:  I have reviewed the data as listed Lab Results  Component Value Date   WBC 2.1 (L) 02/24/2019   HGB 9.4 (L) 02/24/2019   HCT 28.4 (L) 02/24/2019   MCV 106.8 (H) 02/24/2019   PLT 134 (L) 02/24/2019   Recent Labs    06/11/18 0510  07/08/18 1035  10/21/18 0845  02/12/19 0824 02/17/19 1049 02/19/19 0953 02/24/19 1259  NA 135   < > 136   < > 134*   < > 136 135 136 137  K 3.9   < > 4.2   < > 3.9   < > 4.0 4.2 3.3* 3.6  CL 102   < > 107   < > 98   < > 106 102 103 104  CO2 27   < > 22   < > 24   < > 24 26 25 24   GLUCOSE 82   < > 119*   < > 111*   < > 91 94 130* 127*  BUN 11   < > 15   < > 17   < > 12 13 12 10   CREATININE 0.63   < > 0.71   < > 0.91   < > 0.69 0.67 0.81 0.81  CALCIUM 8.9   < > 8.7*   < > 9.6   < > 8.6* 9.2 9.1 8.9  GFRNONAA >60   < > >60   < > >60   < > >60 >60 >60 >60  GFRAA >60   < > >60   < > >60   < > >60 >60 >60 >60  PROT 7.0   < > 6.5   < > 8.6*   < > 7.0 7.4  --  7.5  ALBUMIN  3.5   < > 3.4*   < > 4.4   < > 3.5 3.7  --  3.8  AST 41   < > 26   < > 48*   < > 20 43*  --  29  ALT 51*   < > 33   < > 67*   < > 39 62*  --  35  ALKPHOS 171*   < > 132*   < > 185*   < > 129* 136*  --  126  BILITOT 0.7   < > 0.4   < > 1.4*   < > 0.3 0.6  --  0.4  BILIDIR 0.1  --  <0.1  --  0.3*  --   --   --   --   --  IBILI 0.6  --  NOT CALCULATED  --   --   --   --   --   --   --    < > = values in this interval not displayed.    RADIOGRAPHIC STUDIES: I have personally reviewed the radiological images as listed and agreed with the findings in the report. No results found.  ASSESSMENT & PLAN:   Malignant neoplasm of overlapping sites of cervix [facility]) #Cervical adenocarcinoma with metastasis to the lung-currently on carbotaxol.  CT abdomen pelvis-April 2020 -no evidence of  progressive disease in the abdomen pelvis stable bowel thickening; lung nodule -clinically stable.  Currently on single agent taxol.  # proceed with single agent Taxol 3 w-On and 1 week Off. ; Labs today reviewed;  acceptable for treatment today- except ANC- 1.1/ and platelets-134.   #Discussed with the patient that bone marrow especially white count is very suboptimal-which is secondary to onset of chemotherapy.  Blood counts continue to be an issue-the next reasonable option would be Keytruda.  Discussed potential mechanism of action/side effects.  Taxol at this time-however continued issues with count recovery Keytruda would be recommended next.  #Hypomagnesemia-secondary platinum chemotherapy-mag 1.3 proceed with 4 g magnesium today. Stable.   #Chronic right lower quadrant abdominal pain/scar tissue versus others- stable.   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   # adrenal insuffiency- on maintenance steroids-Stable.  Recommend to keep appointment with her  # DISPOSITION:  # Taxol/ Mag 4 gm/ 2h today. # 1 week-  Dr.Yu- bmp/mag-Taxol--Mag 4 gm/2h possible IVFs over 1 hour-Dr.B     All questions were answered. The patient knows to call the clinic with any problems, questions or concerns.     Cammie Sickle, MD 02/26/2019 11:38 AM

## 2019-02-26 NOTE — Assessment & Plan Note (Addendum)
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  CT abdomen pelvis-April 2020 -no evidence of  progressive disease in the abdomen pelvis stable bowel thickening; lung nodule -clinically stable.  Currently on single agent taxol.  # proceed with single agent Taxol 3 w-On and 1 week Off. ; Labs today reviewed;  acceptable for treatment today- except ANC- 1.1/ and platelets-134.   #Discussed with the patient that bone marrow especially white count is very suboptimal-which is secondary to onset of chemotherapy.  Blood counts continue to be an issue-the next reasonable option would be Keytruda.  Discussed potential mechanism of action/side effects.  Taxol at this time-however continued issues with count recovery Keytruda would be recommended next.  #Hypomagnesemia-secondary platinum chemotherapy-mag 1.3 proceed with 4 g magnesium today. Stable.   #Chronic right lower quadrant abdominal pain/scar tissue versus others- stable.   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   # adrenal insuffiency- on maintenance steroids-Stable.  Recommend to keep appointment with her  # DISPOSITION:  # Taxol/ Mag 4 gm/ 2h today. # 1 week- Dr.Yu- bmp/mag-Taxol--Mag 4 gm/2h possible IVFs over 1 hour-Dr.B

## 2019-03-03 ENCOUNTER — Ambulatory Visit: Payer: Medicaid Other | Admitting: Internal Medicine

## 2019-03-05 ENCOUNTER — Inpatient Hospital Stay (HOSPITAL_BASED_OUTPATIENT_CLINIC_OR_DEPARTMENT_OTHER): Payer: Medicaid Other | Admitting: Oncology

## 2019-03-05 ENCOUNTER — Inpatient Hospital Stay: Payer: Medicaid Other

## 2019-03-05 ENCOUNTER — Encounter: Payer: Self-pay | Admitting: Internal Medicine

## 2019-03-05 ENCOUNTER — Inpatient Hospital Stay (HOSPITAL_BASED_OUTPATIENT_CLINIC_OR_DEPARTMENT_OTHER): Payer: Medicaid Other | Admitting: Hospice and Palliative Medicine

## 2019-03-05 ENCOUNTER — Encounter: Payer: Self-pay | Admitting: Oncology

## 2019-03-05 ENCOUNTER — Other Ambulatory Visit: Payer: Self-pay

## 2019-03-05 VITALS — BP 141/85 | HR 82 | Temp 95.3°F | Resp 22

## 2019-03-05 DIAGNOSIS — G893 Neoplasm related pain (acute) (chronic): Secondary | ICD-10-CM

## 2019-03-05 DIAGNOSIS — Z923 Personal history of irradiation: Secondary | ICD-10-CM

## 2019-03-05 DIAGNOSIS — C538 Malignant neoplasm of overlapping sites of cervix uteri: Secondary | ICD-10-CM

## 2019-03-05 DIAGNOSIS — C7800 Secondary malignant neoplasm of unspecified lung: Secondary | ICD-10-CM | POA: Diagnosis not present

## 2019-03-05 DIAGNOSIS — Z515 Encounter for palliative care: Secondary | ICD-10-CM | POA: Diagnosis not present

## 2019-03-05 DIAGNOSIS — E876 Hypokalemia: Secondary | ICD-10-CM | POA: Diagnosis not present

## 2019-03-05 DIAGNOSIS — R531 Weakness: Secondary | ICD-10-CM

## 2019-03-05 DIAGNOSIS — R197 Diarrhea, unspecified: Secondary | ICD-10-CM

## 2019-03-05 DIAGNOSIS — Z79899 Other long term (current) drug therapy: Secondary | ICD-10-CM

## 2019-03-05 DIAGNOSIS — R112 Nausea with vomiting, unspecified: Secondary | ICD-10-CM

## 2019-03-05 DIAGNOSIS — Z5111 Encounter for antineoplastic chemotherapy: Secondary | ICD-10-CM | POA: Diagnosis not present

## 2019-03-05 DIAGNOSIS — E860 Dehydration: Secondary | ICD-10-CM

## 2019-03-05 LAB — CBC WITH DIFFERENTIAL/PLATELET
Abs Immature Granulocytes: 0.02 10*3/uL (ref 0.00–0.07)
Basophils Absolute: 0 10*3/uL (ref 0.0–0.1)
Basophils Relative: 0 %
Eosinophils Absolute: 0 10*3/uL (ref 0.0–0.5)
Eosinophils Relative: 1 %
HCT: 28.3 % — ABNORMAL LOW (ref 36.0–46.0)
Hemoglobin: 9.4 g/dL — ABNORMAL LOW (ref 12.0–15.0)
Immature Granulocytes: 1 %
Lymphocytes Relative: 11 %
Lymphs Abs: 0.3 10*3/uL — ABNORMAL LOW (ref 0.7–4.0)
MCH: 34.9 pg — ABNORMAL HIGH (ref 26.0–34.0)
MCHC: 33.2 g/dL (ref 30.0–36.0)
MCV: 105.2 fL — ABNORMAL HIGH (ref 80.0–100.0)
Monocytes Absolute: 0.2 10*3/uL (ref 0.1–1.0)
Monocytes Relative: 5 %
Neutro Abs: 2.5 10*3/uL (ref 1.7–7.7)
Neutrophils Relative %: 82 %
Platelets: 123 10*3/uL — ABNORMAL LOW (ref 150–400)
RBC: 2.69 MIL/uL — ABNORMAL LOW (ref 3.87–5.11)
RDW: 14.6 % (ref 11.5–15.5)
WBC: 3.1 10*3/uL — ABNORMAL LOW (ref 4.0–10.5)
nRBC: 0 % (ref 0.0–0.2)

## 2019-03-05 LAB — COMPREHENSIVE METABOLIC PANEL
ALT: 60 U/L — ABNORMAL HIGH (ref 0–44)
AST: 33 U/L (ref 15–41)
Albumin: 3.9 g/dL (ref 3.5–5.0)
Alkaline Phosphatase: 135 U/L — ABNORMAL HIGH (ref 38–126)
Anion gap: 11 (ref 5–15)
BUN: 14 mg/dL (ref 6–20)
CO2: 23 mmol/L (ref 22–32)
Calcium: 9 mg/dL (ref 8.9–10.3)
Chloride: 104 mmol/L (ref 98–111)
Creatinine, Ser: 0.77 mg/dL (ref 0.44–1.00)
GFR calc Af Amer: >60 mL/min (ref >60)
GFR calc non Af Amer: >60 mL/min (ref >60)
Glucose, Bld: 109 mg/dL — ABNORMAL HIGH (ref 70–99)
Potassium: 3.4 mmol/L — ABNORMAL LOW (ref 3.5–5.1)
Sodium: 138 mmol/L (ref 135–145)
Total Bilirubin: 0.5 mg/dL (ref 0.3–1.2)
Total Protein: 7.3 g/dL (ref 6.5–8.1)

## 2019-03-05 LAB — MAGNESIUM: Magnesium: 1.4 mg/dL — ABNORMAL LOW (ref 1.7–2.4)

## 2019-03-05 MED ORDER — HEPARIN SOD (PORK) LOCK FLUSH 100 UNIT/ML IV SOLN
500.0000 [IU] | Freq: Once | INTRAVENOUS | Status: AC | PRN
  Administered 2019-03-05: 500 [IU]

## 2019-03-05 MED ORDER — ONDANSETRON HCL 4 MG/2ML IJ SOLN
4.0000 mg | Freq: Once | INTRAMUSCULAR | Status: AC
  Administered 2019-03-05: 09:00:00 4 mg via INTRAVENOUS
  Filled 2019-03-05: qty 2

## 2019-03-05 MED ORDER — SODIUM CHLORIDE 0.9 % IV SOLN
INTRAVENOUS | Status: DC
  Administered 2019-03-05: 09:00:00 via INTRAVENOUS
  Filled 2019-03-05 (×2): qty 250

## 2019-03-05 MED ORDER — MAGNESIUM SULFATE 4 GM/100ML IV SOLN
4.0000 g | Freq: Once | INTRAVENOUS | Status: AC
  Administered 2019-03-05: 10:00:00 4 g via INTRAVENOUS
  Filled 2019-03-05: qty 100

## 2019-03-05 MED ORDER — SODIUM CHLORIDE 0.9 % IV SOLN
Freq: Once | INTRAVENOUS | Status: AC
  Administered 2019-03-05: 10:00:00 via INTRAVENOUS
  Filled 2019-03-05: qty 250

## 2019-03-05 MED ORDER — PROMETHAZINE HCL 25 MG PO TABS: 25.0000 mg | ORAL_TABLET | Freq: Three times a day (TID) | ORAL | 3 refills | Status: DC | PRN

## 2019-03-05 NOTE — Progress Notes (Signed)
[facility]  Telephone:([phone] [phone]   Name: Joanna Hall Date: 03/05/2019 MRN: [id number]  DOB: 1966-07-24  Patient Care Team: Arnetha Courser, MD as PCP - General (Family Medicine) Mellody Drown, MD as Consulting Physician (Obstetrics and Gynecology) Lenor Coffin, MD as Attending Physician (Gynecology) Lequita Asal, MD as Medical Oncologist (Hematology and Oncology) [name], MD as Consulting Physician (Gastroenterology) Michael Boston, MD as Consulting Physician (General Surgery) Lovell Sheehan, MD as Consulting Physician (Orthopedic Surgery) Clent Jacks, RN as Registered Nurse    REASON FOR CONSULTATION: Palliative Care consult requested for this 53 y.o. 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  PMH also notable for history of right hemicolectomy with chronic inflammatory changes to the colon.  Patient is followed by GI.  She was recently hospitalized 11/19/2020 11/25/2018 with GI bleed.  Patient underwent flex sig on 2/6 but was mostly unremarkable except edematous mucosa.  Patient has chronic abdominal pain.  She presented to the ER on 12/16/2018 with acute on chronic intractable abdominal pain.  Patient had CT angio of the abdomen/pelvis, which revealed mucosal enhancement and wall thickening in the rectum and sigmoid colon but was essentially unchanged from previous studies.  Palliative care was consulted to help address symptoms and goals.  SOCIAL HISTORY:    Patient is not married.  She lives at home with her mother and father.  She has an adult daughter who lives in [place], [place].  She had another daughter who is now deceased.  ADVANCE DIRECTIVES:  Not on file   CODE STATUS:   PAST MEDICAL HISTORY: Past Medical History:  Diagnosis Date   Abdominal  pain 06/10/2018   Abnormal cervical Papanicolaou smear 09/18/2017   Anxiety    Aortic atherosclerosis (HCC)    Arthritis    neck and knees   Blood clots in brain    both lungs and right kidney   Blood transfusion without reported diagnosis    Cervical cancer ([place]) 09/2016   mets lung   Chronic anal fissure    Chronic diarrhea    Dyspnea    Erosive gastropathy 09/18/2017   Factor V Leiden mutation ([place])    Fecal incontinence    Genital warts    GERD (gastroesophageal reflux disease)    Heart murmur    Hematochezia    Hemorrhoids    Hepatitis C    Chronic, 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   Intestinal infection due to Clostridium difficile 09/18/2017   Macrocytic anemia with vitamin B12 deficiency    Multiple gastric ulcers    Nausea vomiting and diarrhea    Pancolitis ([place]) 07/27/2018   Perianal condylomata    Pneumonia    History of   Pulmonary nodules    Rectal bleeding    Small bowel obstruction ([place]) 08/2017   Stiff neck    limited right turn   Vitamin D deficiency     PAST SURGICAL HISTORY:  Past Surgical History:  Procedure Laterality Date   CHOLECYSTECTOMY     COLON SURGERY  08/2017   resection   COLONOSCOPY WITH PROPOFOL N/A 12/20/2017   Procedure: COLONOSCOPY WITH PROPOFOL;  Surgeon: [name], MD;  Location: [facility] ENDOSCOPY;  Service: Gastroenterology;  Laterality: N/A;   COLONOSCOPY WITH PROPOFOL N/A 07/30/2018   Procedure: COLONOSCOPY WITH PROPOFOL;  Surgeon: [name], MD;  Location: [facility] ENDOSCOPY;  Service: Gastroenterology;  Laterality: N/A;   COLONOSCOPY WITH PROPOFOL N/A 10/10/2018   Procedure: COLONOSCOPY WITH  PROPOFOL;  Surgeon: Lucilla Lame, MD;  Location: [facility] ENDOSCOPY;  Service: Endoscopy;  Laterality: N/A;   DIAGNOSTIC LAPAROSCOPY     ESOPHAGOGASTRODUODENOSCOPY (EGD) WITH PROPOFOL N/A 12/20/2017   Procedure: ESOPHAGOGASTRODUODENOSCOPY (EGD) WITH PROPOFOL;  Surgeon: [name], MD;  Location: [facility] ENDOSCOPY;  Service: Gastroenterology;  Laterality: N/A;   ESOPHAGOGASTRODUODENOSCOPY (EGD) WITH PROPOFOL  07/30/2018   Procedure: ESOPHAGOGASTRODUODENOSCOPY (EGD) WITH PROPOFOL;  Surgeon: [name], MD;  Location: [facility] ENDOSCOPY;  Service: Gastroenterology;;   [place] N/A 11/21/2018   Procedure: FLEXIBLE SIGMOIDOSCOPY;  Surgeon: [name], MD;  Location: [facility] ENDOSCOPY;  Service: Gastroenterology;  Laterality: N/A;   LAPAROTOMY N/A 08/31/2017   Procedure: EXPLORATORY LAPAROTOMY for SBO, ileocolectomy, removal of piece of uterine wall;  Surgeon: [name], MD;  Location: [facility] ORS;  Service: General;  Laterality: N/A;   LASER ABLATION CONDOLAMATA N/A 02/22/2018   Procedure: LASER ABLATION/REMOVAL OF XHBZJIRCVEL AROUND ANUS AND VAGINA;  Surgeon: Michael Boston, MD;  Location: [place];  Service: General;  Laterality: N/A;   OOPHORECTOMY     PORTA CATH INSERTION N/A 05/13/2018   Procedure: PORTA CATH INSERTION;  Surgeon: Katha Cabal, MD;  Location: [place] CV LAB;  Service: Cardiovascular;  Laterality: N/A;   SMALL INTESTINE SURGERY     TANDEM RING INSERTION     x3   THORACOTOMY     TOTAL KNEE ARTHROPLASTY Left 04/24/2018   Procedure: TOTAL KNEE ARTHROPLASTY;  Surgeon: Lovell Sheehan, MD;  Location: [facility] ORS;  Service: Orthopedics;  Laterality: Left;    HEMATOLOGY/ONCOLOGY HISTORY:  Oncology History   # dec 2017- CERVICAL ADENO CA []; Stage IB [Dr. Christene Slates at [facility] in [place], [place];No surgery- Chemo-RT;  # 2018-sep - lung nodules [in [place]]  # AUG 20th, 2019-#1  Carbo-Taxol s/p 6 cycles- [No avastin sec to blood clots]: Feb 2020-bilateral lung nodules with 1 cm in size. March 2020- finished carbo-taxol #7; poor tolerance to [place].  # April 15 th 2020- Start Taxol weekly x 3; one week OFF  # summer-/fall 2019-Bil PE/kidney infract; factor V Leiden Leiden - xarelto [small bowel obstruction]; moved to [facility]   #Fall 2019 PE/renal infarct on Xarelto-factor V Leiden  # NGS/[facility]- PDL CPS 15; NEG for other targets**  ------------------------------------------------------------- She was treated by  Decision was made to pursue concurrent chemotherapy (weekly cisplatin) and radiation.  She received treatment from 11/2016-05/2017.  01/2017 cisplatin x 2 and carboplatin x 1 (01/29/2017) due to ARF and XRT.  XRT was followed by T & O on 02/01/2017 and T & N 02/10/2017 and 02/20/2017.  Course was complicated by 80 pound weight loss, nausea, vomiting, electrolyte wasting (potassium and magnesium).  She describes that.  Is been sick constantly requiring at least 20 hospitalizations.  Follow-up CT chest and PET on 06/2017. Per patient, 'radiation worked' and no disease in the abdomen.  At that time she was noted to have lung nodules that were growing and follow-up imaging was scheduled for 10/2017.  She was admitted to hospital in  [place] for small bowel obstruction which was managed conservatively and she was home for a week prior to traveling to [place] for Thanksgiving holiday where she has family.  She presented to ER in [place] on 08/2017 with nausea, vomiting, and lower abdominal pain.  Symptoms did not respond to conservative treatment.  CT on 08/26/2017 revealed small bowel obstruction with transition in the pelvis just superior to the uterus rather was a long segment of distal ileum with fatty wall thickening compatible with chronic inflammation and/or radiation enteritis. Imaging showed numerous pulmonary nodules consistent with metastatic  disease. She underwent laparotomy and right ileocolectomy on 08/31/2017 at [facility].  Surgical findings revealed a thickened, matted, and scarred piece of distal small bowel close to the ileocecal valve.  She was discharged on 09/05/2017.  Pain markedly increased in intensity and imaging was performed on 09/11/2017 which revealed: Debris within the anterior abdominal wall incision concerning for infection versus packing material, s/p post ileo-colectomy with expected postoperative changes, mild colonic ileus, numerous pulmonary nodules highly concerning for metastatic disease, punctate nonobstructing nephrolithiasis.  Staples were removed and one was packed.  She was started on doxycycline.  Abdominal and pelvic CT without contrast on 09/11/2017 revealed debris within anterior abdominal wall incision concerning for infection, versus packing material.She is s/pileocolectomy with expected postoperative changes and mild colonic ileus. There were numerous pulmonary nodules highly concerning for metastatic diseaseand punctate nonobstructing nephrolithiasis. She was readmitted on 09/12/2017. She describes the onset of lower abdominal pain on 09/09/2017. Pain markedly increased in intensity on 09/11/2017.  Staples were removed and the wound packed. She was started on doxycycline.  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  PET scan was ordered to evaluate enlarging lung nodules with concern for recurrent cervical cancer but scan was delayed due to insurance and need to be performed in [place].  Presented to ER on  12/03/2017 for abdominal pain and emesis.  Imaging concerning for worsening possible uterine infarct and she was admitted to hospital.  Pelvic MRI was unremarkable.  Remote scarring type changes of uterus thought to be possibly related to radiation.  She was discharged on 12/08/2017.  Underwent endoscopy and colonoscopy on 12/20/2017.    On 02/22/2018 she underwent laser ablation of condylomata around the anus and vagina under anesthesia with Dr. Johney Maine.   04/15/2018: Chest, abdomen, and pelvis CTrevealed innumerable (>100) cavitary nodules scattered in the lungs, moderately enlarging compared to the 11/08/2017 PET-CT, suspicious for metastatic disease. One index node in the RLL measures 1.0 x 1.1 cm (previously 0.6 x 0.6 cm). There were no new nodules. There was an ill-defined wall thickening in the rectosigmoid with surrounding stranding along fascia planes, probably sequela from prior radiation therapy. There was multilevel lumbar impingement due to spondylosis and degenerative disc disease. There was heterogeneous enhancement in the uterus (some possibly from prior radiation therapy).  04/23/2018:PET scan revealednumerous scattered solid and cavitary nodules in the lungs stable increased in size compared to the prior PET-CT from 11/08/2017.Largest nodule was 1.1 cm in the LUL (SUV 1.9).These demonstratedlow-grade metabolic activity up to a maximum SUV of about 2.3, increased from 11/08/2017.   Case was discussed  at tumor board on 04/25/2018. Consensus to pursue CT-guided biopsy (05/06/18) which revealed: Metastatic adenocarcinoma, morphologically consistent with cervical adenocarcinoma.  She has history of chronic hepatitis C which is managed by GI.  Hepatitis C genotype is 2a/2c.  She receives B12 injections for history of B12 deficiency.  On 04/24/2018 she underwent left total knee replacement with Dr. [name]. --------------------------------------------------------   DIAGNOSIS: Cervical  adenocarcinoma  STAGE: IV        ;GOALS: Palliative  CURRENT/MOST RECENT THERAPY : Taxol single agent      Malignant neoplasm of overlapping sites of cervix ([place])   01/29/2019 -  Chemotherapy    The patient had PACLitaxel (TAXOL) 156 mg in sodium chloride 0.9 % 250 mL chemo infusion (</= 12m/m2), 80 mg/m2 = 156 mg, Intravenous,  Once, 2 of 4 cycles Dose modification: 65 mg/m2 (original dose 80 mg/m2, Cycle 1, Reason: Provider Judgment) Administration: 156 mg (01/29/2019), 156 mg (02/05/2019), 126 mg (02/12/2019), 126 mg (02/26/2019)  for chemotherapy treatment.      ALLERGIES:  is allergic to ketamine.  MEDICATIONS:  Current Outpatient Medications  Medication Sig Dispense Refill   amitriptyline (ELAVIL) 75 MG tablet Take 1 tablet (75 mg total) by mouth at bedtime. 90 tablet 1   budesonide (ENTOCORT EC) 3 MG 24 hr capsule Take 3 capsules (9 mg total) by mouth daily. 30 capsule 0   Calcium Carb-Cholecalciferol (CALCIUM 500 +D) 500-400 MG-UNIT TABS Take 2 tablets by mouth daily.     diphenoxylate-atropine (LOMOTIL) 2.5-0.025 MG tablet TAKE 2 TABLETS BY MOUTH FOUR TIMES A DAY 240 tablet 0   famotidine (PEPCID) 20 MG tablet Take 1 tablet (20 mg total) by mouth 2 (two) times daily as needed for heartburn or indigestion.     hyoscyamine (LEVBID) 0.375 MG 12 hr tablet Take 1 tablet (0.375 mg total)by mouth 2 (two) times daily. 60 tablet 0   meclizine (ANTIVERT) 25 MG tablet Take 1 tablet (25 mg total) by mouth 2 (two) times daily as needed for dizziness. 15 tablet 0   Multiple Vitamins-Minerals (MULTIVITAMIN WITH MINERALS) tablet Take 1 tablet by mouth daily. 30 tablet 0   ondansetron (ZOFRAN ODT) 4 MG disintegrating tablet Take 1 tablet (4 mg total) by mouth every 8 (eight) hours as needed for nausea or vomiting. 20 tablet 0   oxyCODONE (OXYCONTIN) 15 mg 12 hr tablet Take 1 tablet (15 mg total) by mouth every 12 (twelve) hours. 30 tablet 0   Oxycodone HCl 10 MG TABS Take 1 tablet (10  mg total) by mouth every 6 (six) hours as needed (for breakthrough pain). 90 tablet 0   pantoprazole (PROTONIX) 40 MG tablet Take 1 tablet (40 mg total) by mouth daily. 90 tablet 0   potassium chloride SA (K-DUR,KLOR-CON) 20 MEQ tablet Take 1 tablet (20 mEq total) by mouth daily. 30 tablet 3   promethazine (PHENERGAN) 25 MG tablet Take 1 tablet (25 mg total) by mouth every 8 (eight) hours as needed for nausea or vomiting. 30 tablet 3   rivaroxaban (XARELTO) 20 MG TABS tablet Take 1 tablet (20 mg total) by mouth daily with supper. 30 tablet 1   VENTOLIN HFA 108 (90 Base) MCG/ACT inhaler Inhale 1-2 puffs into the lungs every 4 (four) hours as needed for shortness of breath. 1 Inhaler 1   VIBERZI 100 MG TABS TAKE 1 TABLET BY MOUTH TWICE DAILY WITH A MEAL 60 tablet 0   No current facility-administered medications for this visit.    Facility-Administered Medications Ordered in Other  Visits  Medication Dose Route Frequency Provider Last Rate Last Dose   0.9 %  sodium chloride infusion   Intravenous Continuous Verlon Au, NP 20 mL/hr at 03/05/19 0944     heparin lock flush 100 unit/mL  500 Units Intravenous Once Corcoran, Melissa C, MD       sodium chloride 0.9 % 1,000 mL with potassium chloride 20 mEq infusion   Intravenous Once Honor Loh E, NP       sodium chloride flush (NS) 0.9 % injection 10 mL  10 mL Intravenous Once Sumiko Ceasar, Kirt Boys, NP        VITAL SIGNS: There were no vitals taken for this visit. There were no vitals filed for this visit.  Estimated body mass index is 25.62 kg/m as calculated from the following:   Height as of 02/26/19: 5' 9"  (1.753 m).   Weight as of 02/26/19: 173 lb 8 oz (78.7 kg).  LABS: CBC:    Component Value Date/Time   WBC 3.1 (L) 03/05/2019 0850   HGB 9.4 (L) 03/05/2019 0850   HCT 28.3 (L) 03/05/2019 0850   PLT 123 (L) 03/05/2019 0850   MCV 105.2 (H) 03/05/2019 0850   NEUTROABS 2.5 03/05/2019 0850   LYMPHSABS 0.3 (L) 03/05/2019 0850    MONOABS 0.2 03/05/2019 0850   EOSABS 0.0 03/05/2019 0850   BASOSABS 0.0 03/05/2019 0850   Comprehensive Metabolic Panel:    Component Value Date/Time   NA 138 03/05/2019 0850   K 3.4 (L) 03/05/2019 0850   CL 104 03/05/2019 0850   CO2 23 03/05/2019 0850   BUN 14 03/05/2019 0850   CREATININE 0.77 03/05/2019 0850   GLUCOSE 109 (H) 03/05/2019 0850   CALCIUM 9.0 03/05/2019 0850   AST 33 03/05/2019 0850   ALT 60 (H) 03/05/2019 0850   ALKPHOS 135 (H) 03/05/2019 0850   BILITOT 0.5 03/05/2019 0850   PROT 7.3 03/05/2019 0850   ALBUMIN 3.9 03/05/2019 0850    RADIOGRAPHIC STUDIES: No results found.  PERFORMANCE STATUS (ECOG) : 2  REVIEW OF SYSTEMS: As noted above. Otherwise, a complete review of systems is negative.  PHYSICAL EXAM: General: NAD, frail appearing, thin Cardiac: Regular rate and rhythm Pulmonary: Unlabored, clear to auscultation anterior fields Abdomen: Soft, nontender to palp Extremities: no edema Skin: no rashes Neurological: Weakness but otherwise nonfocal  IMPRESSION: Patient was an acute add-on to the clinic today for complaint of weakness and diarrhea.  She reports that these symptoms are her chronic.  She fluctuates from having regular bowel movements to having profuse diarrhea.  She denies any other distressing symptoms.  No fevers or chills.  Symptoms were thought to be possibly related to her active treatment with Taxol, which has been dose reduced.  There is been discussion about rotating her to [facility] if symptoms persist.  Patient received IV fluids today and stated that she is already feeling much better.  She reports stable pain and appetite.  Case and plan discussed with Dr. Rogue Bussing, who will see her in follow-up next week.  PLAN: -Continue current scope of treatment -CBC and C met -1 L normal saline -Continue prescribed antiemetics and antidiarrheals -Continue oxycodone 10 mg every 6 hours as needed for pain  -Continue OxyContin 15 mg  every 12 hours -Will plan to complete MOST form during future visit -RTC 1 month   Patient expressed understanding and was in agreement with this plan. She also understands that She can call clinic at any time with any questions, concerns, or  complaints.     Time Total: 15 minutes  Visit consisted of counseling and education dealing with the complex and emotionally intense issues of symptom management and palliative care in the setting of serious and potentially life-threatening illness.Greater than 50%  of this time was spent counseling and coordinating care related to the above assessment and plan.  Signed by: Altha Harm, PhD, NP-C [phone] (Work Cell)

## 2019-03-05 NOTE — Progress Notes (Signed)
Spoke with Ander Purpura, NP. Patient nausea/vomiting and c/o abdominal pain. Patient very weak. V/o obtained for 4 mg of iv zofran push once and NS 0.9% 999 ml/hr. Dr. Tasia Catchings will see pt in infusion.

## 2019-03-05 NOTE — Progress Notes (Signed)
[place] NOTE  Patient Care Team: Lada, Satira Anis, MD as PCP - General (Family Medicine) Mellody Drown, MD as Consulting Physician (Obstetrics and Gynecology) Lenor Coffin, MD as Attending Physician (Gynecology) Lequita Asal, MD as Medical Oncologist (Hematology and Oncology) [name], MD as Consulting Physician (Gastroenterology) Michael Boston, MD as Consulting Physician (General Surgery) Lovell Sheehan, MD as Consulting Physician (Orthopedic Surgery) Clent Jacks, RN as Registered Nurse  CHIEF COMPLAINTS/PURPOSE OF CONSULTATION: Evaluation prior to chemotherapy treatment for cervical cancer.  #  Oncology History   # dec 2017- CERVICAL ADENO CA [[place]]; Stage IB [Dr. Christene Slates at [facility] in [place], [place];No surgery- Chemo-RT;  # 2018-sep - lung nodules [in [place]]  # AUG 20th, 2019-#1 Carbo-Taxol s/p 6 cycles- [No avastin sec to blood clots]: Feb 2020-bilateral lung nodules with 1 cm in size. March 2020- finished carbo-taxol #7; poor tolerance to [place].  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  She received treatment from 11/2016-05/2017.  01/2017 cisplatin x 2 and carboplatin x 1 (01/29/2017) due to ARF and XRT.  XRT was followed by T & O on 02/01/2017 and T & N 02/10/2017 and 02/20/2017.  Course was complicated by 80 pound weight loss, nausea, vomiting, electrolyte wasting (potassium and magnesium).  She describes that.  Is been sick constantly requiring at least 20  hospitalizations.  Follow-up CT chest and PET on 06/2017. Per patient, 'radiation worked' and no disease in the abdomen.  At that time she was noted to have lung nodules that were growing and follow-up imaging was scheduled for 10/2017.  She was admitted to hospital in [place] for small bowel obstruction which was managed conservatively and she was home for a week prior to traveling to [place] for Thanksgiving holiday where she has family.  She presented to ER in [place] on 08/2017 with nausea, vomiting, and lower abdominal pain.  Symptoms did not respond to conservative treatment.  CT on 08/26/2017 revealed small bowel obstruction with transition in the pelvis just superior to the uterus rather was a long segment of distal ileum with fatty wall thickening compatible with chronic inflammation and/or radiation enteritis. Imaging showed numerous pulmonary nodules consistent with metastatic disease. She underwent laparotomy and right ileocolectomy on 08/31/2017 at [facility].  Surgical findings revealed a thickened, matted, and scarred piece of distal small bowel close to the ileocecal valve.  She was discharged on 09/05/2017.  Pain markedly increased in intensity and imaging was performed on 09/11/2017 which revealed: Debris within the anterior abdominal wall incision concerning for infection versus packing material, s/p post ileo-colectomy with expected postoperative changes, mild colonic ileus, numerous pulmonary nodules highly concerning for metastatic disease, punctate nonobstructing nephrolithiasis.  Staples were removed and one was packed.  She was started on doxycycline.  Abdominal and pelvic CT without contrast on 09/11/2017 revealed debris within anterior abdominal wall incision concerning for infection, versus packing material.She is s/pileocolectomy with expected postoperative changes and mild colonic ileus. There were numerous pulmonary nodules highly concerning for metastatic  diseaseand punctate nonobstructing nephrolithiasis. She was readmitted on 09/12/2017. She  describes the onset of lower abdominal pain on 09/09/2017. Pain markedly increased in intensity on 09/11/2017.  Staples were removed and the wound packed. She was started on doxycycline.  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  PET scan was ordered to evaluate enlarging lung nodules with concern for recurrent cervical cancer but scan was delayed due to insurance and need to be performed in [place].  Presented to ER on 12/03/2017 for abdominal pain and emesis.  Imaging concerning for worsening possible uterine infarct and she was admitted to hospital.  Pelvic MRI was unremarkable.  Remote scarring type changes of uterus thought to be possibly related to radiation.  She was discharged on 12/08/2017.  Underwent endoscopy and colonoscopy on 12/20/2017.    On 02/22/2018 she underwent laser ablation of condylomata around the anus and vagina under anesthesia with Dr. Johney Maine.   04/15/2018: Chest, abdomen, and pelvis CTrevealed innumerable (>100) cavitary nodules scattered in the lungs, moderately enlarging compared to the 11/08/2017 PET-CT, suspicious for metastatic disease. One index node in the RLL measures 1.0 x 1.1 cm (previously 0.6 x 0.6 cm). There were no new nodules. There was an ill-defined wall thickening in the rectosigmoid with surrounding stranding along fascia planes, probably sequela from prior radiation therapy. There was multilevel lumbar impingement due to spondylosis and degenerative disc disease. There was  heterogeneous enhancement in the uterus (some possibly from prior radiation therapy).  04/23/2018:PET scan revealednumerous scattered solid and cavitary nodules in the lungs stable increased in size compared to the prior PET-CT from 11/08/2017.Largest nodule was 1.1 cm in the LUL (SUV 1.9).These demonstratedlow-grade metabolic activity up to a maximum SUV of about 2.3, increased from 11/08/2017.   Case was discussed at tumor board on 04/25/2018. Consensus to pursue CT-guided biopsy (05/06/18) which revealed: Metastatic adenocarcinoma, morphologically consistent with cervical adenocarcinoma.  She has history of chronic hepatitis C which is managed by GI.  Hepatitis C genotype is 2a/2c.  She receives B12 injections for history of B12 deficiency.  On 04/24/2018 she underwent left total knee replacement with Dr. [name]. --------------------------------------------------------   DIAGNOSIS: Cervical adenocarcinoma  STAGE: IV        ;GOALS: Palliative  CURRENT/MOST RECENT THERAPY : Taxol single agent      Malignant neoplasm of overlapping sites of cervix ([place])   01/29/2019 -  Chemotherapy    The patient had PACLitaxel (TAXOL) 156 mg in sodium chloride 0.9 % 250 mL chemo infusion (</= 68m/m2), 80 mg/m2 = 156 mg, Intravenous,  Once, 2 of 4 cycles Dose modification: 65 mg/m2 (original dose 80 mg/m2, Cycle 1, Reason: Provider Judgment) Administration: 156 mg (01/29/2019), 156 mg (02/05/2019), 126 mg (02/12/2019), 126 mg (02/26/2019)  for chemotherapy treatment.       HISTORY OF PRESENTING ILLNESS:  Joanna [id number].[id number].  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  I am covering Dr. BRogue Bussingto see this patient today.  Chart review was performed by me. Patient received Taxol treatment 1 week ago. Today she feels sick  Nausea get worse and she vomited. Appetite is very poor, not able to eat  much. Denies  any fever, chills, chest pain, abdominal pain. She also has chronic diarrhea and chronic hypomagnesemia. Chronic lower abdomen pain at her baseline.  Review of Systems  Constitutional: Positive for malaise/fatigue. Negative for chills, diaphoresis, fever and weight loss.  HENT: Negative for nosebleeds and sore throat.   Eyes: Negative for double vision.  Respiratory: Negative for cough, hemoptysis, sputum production, shortness of breath and wheezing.   Cardiovascular: Negative for chest pain, palpitations, orthopnea and leg swelling.  Gastrointestinal: Positive for abdominal pain, diarrhea, nausea and vomiting. Negative for blood in stool, constipation, heartburn and melena.  Genitourinary: Negative for dysuria, frequency and urgency.  Musculoskeletal: Negative for back pain and joint pain.  Skin: Negative.  Negative for itching and rash.  Neurological: Negative for dizziness, tingling, focal weakness, weakness and headaches.  Endo/Heme/Allergies: Does not bruise/bleed easily.  Psychiatric/Behavioral: Negative for depression. The patient is not nervous/anxious and does not have insomnia.      MEDICAL HISTORY:  Past Medical History:  Diagnosis Date  . Abdominal pain 06/10/2018  . Abnormal cervical Papanicolaou smear 09/18/2017  . Anxiety   . Aortic atherosclerosis ([place])   . Arthritis    neck and knees  . 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 Blood transfusion without reported diagnosis   . Cervical cancer ([place]) 09/2016   mets lung  . Chronic anal fissure   . Chronic diarrhea   . Dyspnea   . Erosive gastropathy 09/18/2017  . Factor V Leiden mutation ([place])   . Fecal incontinence   . Genital warts   . GERD (gastroesophageal reflux disease)   . Heart murmur   . Hematochezia   . Hemorrhoids   . 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 Hepatitis, chronic ([place]) 05/05/2017  . History of cancer chemotherapy    completed 06/2017  . History of  Clostridium difficile infection    while undergoing chemo.  Negative test 10/2017  . Ileocolic anastomotic leak   . 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 Intestinal infection due to Clostridium difficile 09/18/2017  . Macrocytic anemia with vitamin B12 deficiency   . Multiple gastric ulcers   . Nausea vomiting and diarrhea   . Pancolitis ([place]) 07/27/2018  . Perianal condylomata   . Pneumonia    History of  . Pulmonary nodules   . Rectal bleeding   . Small bowel obstruction ([place]) 08/2017  . Stiff neck    limited right turn  . Vitamin D deficiency     SURGICAL HISTORY: Past Surgical History:  Procedure Laterality Date  . CHOLECYSTECTOMY    . COLON SURGERY  08/2017   resection  . COLONOSCOPY WITH PROPOFOL N/A 12/20/2017   Procedure: COLONOSCOPY WITH PROPOFOL;  Surgeon: [name], MD;  Location: [facility] ENDOSCOPY;  Service: Gastroenterology;  Laterality: N/A;  . COLONOSCOPY WITH PROPOFOL N/A 07/30/2018   Procedure: COLONOSCOPY WITH PROPOFOL;  Surgeon: [name], MD;  Location: [facility] ENDOSCOPY;  Service: Gastroenterology;  Laterality: N/A;  . COLONOSCOPY WITH PROPOFOL N/A 10/10/2018   Procedure: COLONOSCOPY WITH PROPOFOL;  Surgeon: Lucilla Lame, MD;  Location[facility] ENDOSCOPY;  Service: Endoscopy;  Laterality: N/A;  . DIAGNOSTIC LAPAROSCOPY    . ESOPHAGOGASTRODUODENOSCOPY (EGD) WITH PROPOFOL N/A 12/20/2017   Procedure: ESOPHAGOGASTRODUODENOSCOPY (EGD) WITH PROPOFOL;  Surgeon: [name], MD;  Location: [place];  Service: Gastroenterology;  Laterality: N/A;  . ESOPHAGOGASTRODUODENOSCOPY (EGD) WITH PROPOFOL  07/30/2018   Procedure: ESOPHAGOGASTRODUODENOSCOPY (EGD) WITH PROPOFOL;  Surgeon: [name], MD;  Location: [facility] ENDOSCOPY;  Service: Gastroenterology;;  . Otho Darner SIGMOIDOSCOPY N/A 11/21/2018   Procedure: FLEXIBLE SIGMOIDOSCOPY;  Surgeon: [name], MD;  Location: [facility] ENDOSCOPY;  Service: Gastroenterology;  Laterality:  N/A;  . LAPAROTOMY N/A 08/31/2017   Procedure: EXPLORATORY LAPAROTOMY for SBO, ileocolectomy, removal of piece of uterine wall;  Surgeon: [name], MD;  Location: [facility] ORS;  Service: General;  Laterality: N/A;  . LASER ABLATION CONDOLAMATA N/A 02/22/2018   Procedure: LASER ABLATION/REMOVAL OF [id number] AROUND ANUS AND VAGINA;  Surgeon: Michael Boston, MD;  Location: [place];  Service: General;  Laterality: N/A;  . OOPHORECTOMY    . PORTA CATH INSERTION N/A 05/13/2018   Procedure: PORTA CATH INSERTION;  Surgeon: Katha Cabal, MD;  Location: [place] CV LAB;  Service: Cardiovascular;  Laterality: N/A;  . SMALL INTESTINE SURGERY    . TANDEM RING INSERTION     x3  . THORACOTOMY    . TOTAL KNEE ARTHROPLASTY Left 04/24/2018   Procedure: TOTAL KNEE ARTHROPLASTY;  Surgeon: Lovell Sheehan, MD;  Location: [facility] ORS;  Service: Orthopedics;  Laterality: Left;    SOCIAL HISTORY: Social History   Socioeconomic History  . Marital status: Divorced    Spouse name: Not on file  . Number of children: Not on file  . Years of education: Not on file  . Highest education level: Not on file  Occupational History  . Not on file  Social Needs  . Financial resource strain: Not hard at all  . Food insecurity:    Worry: Never true    Inability: Never true  . Transportation needs:    Medical: No    Non-medical: No  Tobacco Use  . Smoking status: Former Smoker    Last attempt to quit: 10/16/2006    Years since quitting: 12.3  . Smokeless tobacco: Never Used  Substance and Sexual Activity  . Alcohol use: Not Currently    Frequency: Never    Comment: seldom  . Drug use: Yes    Types: Marijuana  . Sexual activity: Not Currently    Birth control/protection: Post-menopausal    Comment: Not Asked  Lifestyle  . Physical activity:    Days per week: Patient refused    Minutes per session: Patient refused  . Stress: Only a little  Relationships  . Social connections:     Talks on phone: Patient refused    Gets together: Patient refused    Attends religious service: Patient refused    Active member of club or organization: Patient refused    Attends meetings of clubs or organizations: Patient refused    Relationship status: Patient refused  . Intimate partner violence:    Fear of current or ex partner: No    Emotionally abused: No    Physically abused: No    Forced sexual activity: No  Other Topics Concern  . Not on file  Social History Narrative  . Not on file    FAMILY HISTORY: Family History  Problem Relation Age of Onset  . Hypertension Father   . Diabetes Father   . Alcohol abuse Daughter   . Hypertension Maternal Grandmother   . Diabetes Maternal Grandmother   . Diabetes Paternal Grandmother   . Hypertension Paternal Grandmother     ALLERGIES:  is allergic to ketamine.  MEDICATIONS:  Current Outpatient Medications  Medication Sig Dispense Refill  . amitriptyline (ELAVIL) 75 MG  tablet Take 1 tablet (75 mg total) by mouth at bedtime. 90 tablet 1  . budesonide (ENTOCORT EC) 3 MG 24 hr capsule Take 3 capsules (9 mg total) by mouth daily. 30 capsule 0  . Calcium Carb-Cholecalciferol (CALCIUM 500 +D) 500-400 MG-UNIT TABS Take 2 tablets by mouth daily.    . diphenoxylate-atropine (LOMOTIL) 2.5-0.025 MG tablet TAKE 2 TABLETS BY MOUTH FOUR TIMES A DAY 240 tablet 0  . famotidine (PEPCID) 20 MG tablet Take 1 tablet (20 mg total) by mouth 2 (two) times daily as needed for heartburn or indigestion.    . hyoscyamine (LEVBID) 0.375 MG 12 hr tablet Take 1 tablet (0.375 mg total)by mouth 2 (two) times daily. 60 tablet 0  . meclizine (ANTIVERT) 25 MG tablet Take 1 tablet (25 mg total) by mouth 2 (two) times daily as needed for dizziness. 15 tablet 0  . Multiple Vitamins-Minerals (MULTIVITAMIN WITH MINERALS) tablet Take 1 tablet by mouth daily. 30 tablet 0  . ondansetron (ZOFRAN ODT) 4 MG disintegrating tablet Take 1 tablet (4 mg total) by mouth every 8  (eight) hours as needed for nausea or vomiting. 20 tablet 0  . oxyCODONE (OXYCONTIN) 15 mg 12 hr tablet Take 1 tablet (15 mg total) by mouth every 12 (twelve) hours. 30 tablet 0  . Oxycodone HCl 10 MG TABS Take 1 tablet (10 mg total) by mouth every 6 (six) hours as needed (for breakthrough pain). 90 tablet 0  . pantoprazole (PROTONIX) 40 MG tablet Take 1 tablet (40 mg total) by mouth daily. 90 tablet 0  . potassium chloride SA (K-DUR,KLOR-CON) 20 MEQ tablet Take 1 tablet (20 mEq total) by mouth daily. 30 tablet 3  . promethazine (PHENERGAN) 25 MG tablet Take 1 tablet (25 mg total) by mouth every 8 (eight) hours as needed for nausea or vomiting. 30 tablet 3  . rivaroxaban (XARELTO) 20 MG TABS tablet Take 1 tablet (20 mg total) by mouth daily with supper. 30 tablet 1  . VENTOLIN HFA 108 (90 Base) MCG/ACT inhaler Inhale 1-2 puffs into the lungs every 4 (four) hours as needed for shortness of breath. 1 Inhaler 1  . VIBERZI 100 MG TABS TAKE 1 TABLET BY MOUTH TWICE DAILY WITH A MEAL 60 tablet 0   No current facility-administered medications for this visit.    Facility-Administered Medications Ordered in Other Visits  Medication Dose Route Frequency Provider Last Rate Last Dose  . heparin lock flush 100 unit/mL  500 Units Intravenous Once Corcoran, Melissa C, MD      . sodium chloride 0.9 % 1,000 mL with potassium chloride 20 mEq infusion   Intravenous Once Honor Loh E, NP      . sodium chloride flush (NS) 0.9 % injection 10 mL  10 mL Intravenous Once Borders, Kirt Boys, NP          .  PHYSICAL EXAMINATION: ECOG PERFORMANCE STATUS: 1 - Symptomatic but completely ambulatory  Vitals:   03/05/19 0830 03/05/19 0849  BP: (!) 188/133 (!) 141/85  Pulse: 82 82  Resp:  (!) 22  Temp:  (!) 95.3 F (35.2 C)   There were no vitals filed for this visit.  Physical Exam  Constitutional: She is oriented to person, place, and time. No distress.  Alone.  Fair appearance.  Sitting in a chair.  HENT:   Head: Normocephalic and atraumatic.  Mouth/Throat: No oropharyngeal exudate.  Eyes: Pupils are equal, round, and reactive to light. EOM are normal. No scleral icterus.  Neck: Normal range  of motion. Neck supple.  Cardiovascular: Normal rate and regular rhythm.  No murmur heard. Pulmonary/Chest: Effort normal and breath sounds normal. No respiratory distress.  Abdominal: Soft. Bowel sounds are normal. She exhibits no distension.  Musculoskeletal: Normal range of motion.        General: No tenderness or edema.  Neurological: She is alert and oriented to person, place, and time. No cranial nerve deficit.  Skin: Skin is warm and dry. She is not diaphoretic. No erythema.  Psychiatric: Affect normal.     LABORATORY DATA:  I have reviewed the data as listed Lab Results  Component Value Date   WBC 3.1 (L) 03/05/2019   HGB 9.4 (L) 03/05/2019   HCT 28.3 (L) 03/05/2019   MCV 105.2 (H) 03/05/2019   PLT 123 (L) 03/05/2019   Recent Labs    06/11/18 0510  07/08/18 1035  10/21/18 0845  02/17/19 1049 02/19/19 0953 02/24/19 1259 03/05/19 0850  NA 135   < > 136   < > 134*   < > 135 136 137 138  K 3.9   < > 4.2   < > 3.9   < > 4.2 3.3* 3.6 3.4*  CL 102   < > 107   < > 98   < > 102 103 104 104  CO2 27   < > 22   < > 24   < > 26 25 24 23   GLUCOSE 82   < > 119*   < > 111*   < > 94 130* 127* 109*  BUN 11   < > 15   < > 17   < > 13 12 10 14   CREATININE 0.63   < > 0.71   < > 0.91   < > 0.67 0.81 0.81 0.77  CALCIUM 8.9   < > 8.7*   < > 9.6   < > 9.2 9.1 8.9 9.0  GFRNONAA >60   < > >60   < > >60   < > >60 >60 >60 >60  GFRAA >60   < > >60   < > >60   < > >60 >60 >60 >60  PROT 7.0   < > 6.5   < > 8.6*   < > 7.4  --  7.5 7.3  ALBUMIN 3.5   < > 3.4*   < > 4.4   < > 3.7  --  3.8 3.9  AST 41   < > 26   < > 48*   < > 43*  --  29 33  ALT 51*   < > 33   < > 67*   < > 62*  --  35 60*  ALKPHOS 171*   < > 132*   < > 185*   < > 136*  --  126 135*  BILITOT 0.7   < > 0.4   < > 1.4*   < > 0.6  --  0.4 0.5   BILIDIR 0.1  --  <0.1  --  0.3*  --   --   --   --   --   IBILI 0.6  --  NOT CALCULATED  --   --   --   --   --   --   --    < > = values in this interval not displayed.   \RADIOGRAPHIC STUDIES: I have personally reviewed the radiological images as listed and agreed with the findings in the report. 01/23/2019, CT abdomen  pelvis with contrast No substantial interval change in exam.  No findings to explain the patient's history of pelvic pain.  Similar appearance of pulmonary metastasis identified in the lung bases.  Stable extrahepatic biliary duct dilation in this patient.  Wall thickening in the sigmoid colon and rectum is similar to prior.Perirectal and presacral edema also not substantially changed. Features likely reflect sequelae of radiation therapy.  ASSESSMENT & PLAN:  53 year old female with metastatic adenocarcinoma of cervix/lung mets presents for evaluation prior to Taxol treatment. 1. Malignant neoplasm of overlapping sites of cervix ([place])   2. Hypomagnesemia   3. Weakness   4. Intractable vomiting with nausea, unspecified vomiting type     #Metastatic cervical cancer, hold chemotherapy treatment today.  See below. #Hypomagnesia, magnesium level 1.4 today.  Proceed with IV magnesium sulfate 4 g x 1. #Intractable nausea with vomiting, Zofran 4 mg IV x1. IV fluid for hydration.  Continue home antiemetics.  I reviewed Phenergan prescription for her. #Chronic diarrhea, recurrent problem.  Continue Lomotil, Viberzi, Lomotil and as instructed. Follow-up in 1 week for reevaluation. Follow-up with palliative care for symptom management.  All questions were answered. The patient knows to call the clinic with any problems, questions or concerns.     Earlie Server, MD 03/05/2019 8:30 PM

## 2019-03-05 NOTE — Progress Notes (Signed)
Labs reviewed with Dr. Tasia Catchings. No taxol today, pt to receive 4 grams Magnesium IV over 2 hours.

## 2019-03-06 ENCOUNTER — Ambulatory Visit: Payer: Medicaid Other | Admitting: Internal Medicine

## 2019-03-08 LAB — MISC LABCORP TEST (SEND OUT)
LabCorp test name: 791194
Labcorp test code: 791194

## 2019-03-10 ENCOUNTER — Other Ambulatory Visit: Payer: Self-pay

## 2019-03-10 ENCOUNTER — Encounter: Payer: Self-pay | Admitting: Emergency Medicine

## 2019-03-10 DIAGNOSIS — Z5321 Procedure and treatment not carried out due to patient leaving prior to being seen by health care provider: Secondary | ICD-10-CM | POA: Insufficient documentation

## 2019-03-10 DIAGNOSIS — R109 Unspecified abdominal pain: Secondary | ICD-10-CM | POA: Insufficient documentation

## 2019-03-10 LAB — BASIC METABOLIC PANEL
Anion gap: 10 (ref 5–15)
BUN: 14 mg/dL (ref 6–20)
CO2: 23 mmol/L (ref 22–32)
Calcium: 8.5 mg/dL — ABNORMAL LOW (ref 8.9–10.3)
Chloride: 105 mmol/L (ref 98–111)
Creatinine, Ser: 0.77 mg/dL (ref 0.44–1.00)
GFR calc Af Amer: >60 mL/min (ref >60)
GFR calc non Af Amer: >60 mL/min (ref >60)
Glucose, Bld: 113 mg/dL — ABNORMAL HIGH (ref 70–99)
Potassium: 3.6 mmol/L (ref 3.5–5.1)
Sodium: 138 mmol/L (ref 135–145)

## 2019-03-10 LAB — CBC
HCT: 27.8 % — ABNORMAL LOW (ref 36.0–46.0)
Hemoglobin: 9.1 g/dL — ABNORMAL LOW (ref 12.0–15.0)
MCH: 35.7 pg — ABNORMAL HIGH (ref 26.0–34.0)
MCHC: 32.7 g/dL (ref 30.0–36.0)
MCV: 109 fL — ABNORMAL HIGH (ref 80.0–100.0)
Platelets: 143 10*3/uL — ABNORMAL LOW (ref 150–400)
RBC: 2.55 MIL/uL — ABNORMAL LOW (ref 3.87–5.11)
RDW: 15.4 % (ref 11.5–15.5)
WBC: 2.6 10*3/uL — ABNORMAL LOW (ref 4.0–10.5)
nRBC: 0 % (ref 0.0–0.2)

## 2019-03-10 NOTE — ED Triage Notes (Signed)
Pt c/o right flank pain that radiates into back x2 days, accompanied by dysuria and frequency. Pt reports she was diagnosed and treated with antibiotics x1 month ago for urinary infection. Pt on chemotherapy currently.

## 2019-03-11 ENCOUNTER — Inpatient Hospital Stay: Payer: Medicaid Other

## 2019-03-11 ENCOUNTER — Other Ambulatory Visit: Payer: Self-pay | Admitting: *Deleted

## 2019-03-11 ENCOUNTER — Ambulatory Visit: Payer: Medicaid Other | Admitting: Internal Medicine

## 2019-03-11 ENCOUNTER — Encounter: Payer: Self-pay | Admitting: Internal Medicine

## 2019-03-11 ENCOUNTER — Emergency Department
Admission: EM | Admit: 2019-03-11 | Discharge: 2019-03-11 | Disposition: A | Discharge status: Home / Self Care | Payer: Medicaid Other | Attending: Emergency Medicine | Admitting: Emergency Medicine

## 2019-03-11 ENCOUNTER — Telehealth: Payer: Self-pay | Admitting: *Deleted

## 2019-03-11 DIAGNOSIS — R1030 Lower abdominal pain, unspecified: Secondary | ICD-10-CM

## 2019-03-11 DIAGNOSIS — Z5111 Encounter for antineoplastic chemotherapy: Secondary | ICD-10-CM | POA: Diagnosis not present

## 2019-03-11 LAB — URINALYSIS, COMPLETE (UACMP) WITH MICROSCOPIC
Bacteria, UA: NONE SEEN
Glucose, UA: NEGATIVE mg/dL
Hgb urine dipstick: NEGATIVE
Ketones, ur: NEGATIVE mg/dL
Nitrite: NEGATIVE
Protein, ur: NEGATIVE mg/dL
Specific Gravity, Urine: 1.029 (ref 1.005–1.030)
pH: 5 (ref 5.0–8.0)

## 2019-03-11 MED ORDER — OXYCODONE HCL ER 15 MG PO T12A: 15.0000 mg | EXTENDED_RELEASE_TABLET | Freq: Two times a day (BID) | ORAL | 0 refills | Status: DC

## 2019-03-11 NOTE — Telephone Encounter (Signed)
Patient sent message reporting that she is having flank pain and is dizzy. She went to ER last night and waited in a room for 2 hours and "no one came in to check on me an day I saw a nurse come by and asked for a blanket and she said she would be right back and never came back" so she left. She feels she may have a bacterial infection in her Urinary tract again. Please advise

## 2019-03-11 NOTE — Progress Notes (Deleted)
Name: Joanna Hall  MRN/ DOB: [id number], 10-Jun-1966    Age/ Sex: 53 y.o., female    PCP: Arnetha Courser, MD   Reason for Endocrinology Evaluation: ***     Date of Initial Endocrinology Evaluation: 03/11/2019     HPI: Joanna Hall is a 53 y.o. female with a past medical history of ***. The patient presented for initial endocrinology clinic visit on 03/11/2019 for consultative assistance with her ***.   ***  HISTORY:  Past Medical History:  Past Medical History:  Diagnosis Date  . Abdominal pain 06/10/2018  . Abnormal cervical Papanicolaou smear 09/18/2017  . Anxiety   . Aortic atherosclerosis ([place])   . Arthritis    neck and knees  . 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 Blood transfusion without reported diagnosis   . Cervical cancer ([place]) 09/2016   mets lung  . Chronic anal fissure   . Chronic diarrhea   . Dyspnea   . Erosive gastropathy 09/18/2017  . Factor V Leiden mutation ([place])   . Fecal incontinence   . Genital warts   . GERD (gastroesophageal reflux disease)   . Heart murmur   . Hematochezia   . Hemorrhoids   . 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 Hepatitis, chronic ([place]) 05/05/2017  . History of cancer chemotherapy    completed 06/2017  . History of Clostridium difficile infection    while undergoing chemo.  Negative test 10/2017  . Ileocolic anastomotic leak   . 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 Intestinal infection due to Clostridium difficile 09/18/2017  . Macrocytic anemia with vitamin B12 deficiency   . Multiple gastric ulcers   . Nausea vomiting and diarrhea   . Pancolitis ([place]) 07/27/2018  . Perianal condylomata   . Pneumonia    History of  . Pulmonary nodules   . Rectal bleeding   . Small bowel obstruction ([place]) 08/2017  . Stiff neck    limited right turn  . Vitamin D deficiency     Past Surgical History:  Past Surgical History:  Procedure Laterality  Date  . CHOLECYSTECTOMY    . COLON SURGERY  08/2017   resection  . COLONOSCOPY WITH PROPOFOL N/A 12/20/2017   Procedure: COLONOSCOPY WITH PROPOFOL;  Surgeon: [name], MD;  Location: [facility] ENDOSCOPY;  Service: Gastroenterology;  Laterality: N/A;  . COLONOSCOPY WITH PROPOFOL N/A 07/30/2018   Procedure: COLONOSCOPY WITH PROPOFOL;  Surgeon: [name], MD;  Location: [facility] ENDOSCOPY;  Service: Gastroenterology;  Laterality: N/A;  . COLONOSCOPY WITH PROPOFOL N/A 10/10/2018   Procedure: COLONOSCOPY WITH PROPOFOL;  Surgeon: Lucilla Lame, MD;  Location: [facility] ENDOSCOPY;  Service: Endoscopy;  Laterality: N/A;  . DIAGNOSTIC LAPAROSCOPY    . ESOPHAGOGASTRODUODENOSCOPY (EGD) WITH PROPOFOL N/A 12/20/2017   Procedure: ESOPHAGOGASTRODUODENOSCOPY (EGD) WITH PROPOFOL;  Surgeon: [name], MD;  Location: [place];  Service: Gastroenterology;  Laterality: N/A;  . ESOPHAGOGASTRODUODENOSCOPY (EGD) WITH PROPOFOL  07/30/2018   Procedure: ESOPHAGOGASTRODUODENOSCOPY (EGD) WITH PROPOFOL;  Surgeon: [name], MD;  Location: [facility] ENDOSCOPY;  Service: Gastroenterology;;  . Otho Darner SIGMOIDOSCOPY N/A 11/21/2018   Procedure: FLEXIBLE SIGMOIDOSCOPY;  Surgeon: [name], MD;  Location: [facility] ENDOSCOPY;  Service: Gastroenterology;  Laterality: N/A;  . LAPAROTOMY N/A 08/31/2017   Procedure: EXPLORATORY LAPAROTOMY for SBO, ileocolectomy, removal of piece of uterine wall;  Surgeon: [name], MD;  Location: [facility] ORS;  Service: General;  Laterality: N/A;  . LASER ABLATION CONDOLAMATA N/A 02/22/2018   Procedure: LASER ABLATION/REMOVAL OF CONDOLAMATA AROUND ANUS AND VAGINA;  Surgeon: Michael Boston, MD;  Location: [place];  Service: General;  Laterality: N/A;  . OOPHORECTOMY    . PORTA CATH INSERTION N/A 05/13/2018   Procedure: PORTA CATH INSERTION;  Surgeon: Katha Cabal, MD;  Location: [place] CV LAB;  Service: Cardiovascular;  Laterality: N/A;  .  SMALL INTESTINE SURGERY    . TANDEM RING INSERTION     x3  . THORACOTOMY    . TOTAL KNEE ARTHROPLASTY Left 04/24/2018   Procedure: TOTAL KNEE ARTHROPLASTY;  Surgeon: Lovell Sheehan, MD;  Location: [facility] ORS;  Service: Orthopedics;  Laterality: Left;      Social History:  reports that she quit smoking about 12 years ago. She has never used smokeless tobacco. She reports previous alcohol use. She reports current drug use. Drug: Marijuana.  Family History: family history includes Alcohol abuse in her daughter; Diabetes in her father, maternal grandmother, and paternal grandmother; Hypertension in her father, maternal grandmother, and paternal grandmother.   HOME MEDICATIONS: Allergies as of 03/11/2019      Reactions   Ketamine Anxiety, Other (See Comments)   Syncope episode/confusion      Medication List       Accurate as of Mar 11, 2019  8:58 AM. If you have any questions, ask your nurse or doctor.        amitriptyline 75 MG tablet Commonly known as:  ELAVIL Take 1 tablet (75 mg total) by mouth at bedtime.   budesonide 3 MG 24 hr capsule Commonly known as:  ENTOCORT EC Take 3 capsules (9 mg total) by mouth daily.   Calcium 500 +D 500-400 MG-UNIT Tabs Generic drug:  Calcium Carb-Cholecalciferol Take 2 tablets by mouth daily.   diphenoxylate-atropine 2.5-0.025 MG tablet Commonly known as:  LOMOTIL TAKE 2 TABLETS BY MOUTH FOUR TIMES A DAY   famotidine 20 MG tablet Commonly known as:  PEPCID Take 1 tablet (20 mg total) by mouth 2 (two) times daily as needed for heartburn or indigestion.   hyoscyamine 0.375 MG 12 hr tablet Commonly known as:  LEVBID Take 1 tablet (0.375 mg total)by mouth 2 (two) times daily.   meclizine 25 MG tablet Commonly known as:  ANTIVERT Take 1 tablet (25 mg total) by mouth 2 (two) times daily as needed for dizziness.   multivitamin with minerals tablet Take 1 tablet by mouth daily.   ondansetron 4 MG disintegrating tablet Commonly known as:   Zofran ODT Take 1 tablet (4 mg total) by mouth every 8 (eight) hours as needed for nausea or vomiting.   oxyCODONE 15 mg 12 hr tablet Commonly known as:  OXYCONTIN Take 1 tablet (15 mg total) by mouth every 12 (twelve) hours.   Oxycodone HCl 10 MG Tabs Take 1 tablet (10 mg total) by mouth every 6 (six) hours as needed (for breakthrough pain).   pantoprazole 40 MG tablet Commonly known as:  PROTONIX Take 1 tablet (40 mg total) by mouth daily.   potassium chloride SA 20 MEQ tablet Commonly known as:  K-DUR Take 1 tablet (20 mEq total) by mouth daily.   promethazine 25 MG tablet Commonly known as:  PHENERGAN Take 1 tablet (25 mg total) by mouth every 8 (eight) hours as needed for nausea or vomiting.   rivaroxaban 20 MG Tabs tablet Commonly known as:  XARELTO Take 1 tablet (  20 mg total) by mouth daily with supper.   Ventolin HFA 108 (90 Base) MCG/ACT inhaler Generic drug:  albuterol Inhale 1-2 puffs into the lungs every 4 (four) hours as needed for shortness of breath.   Viberzi 100 MG Tabs Generic drug:  Eluxadoline TAKE 1 TABLET BY MOUTH TWICE DAILY WITH A MEAL         REVIEW OF SYSTEMS: A comprehensive ROS was conducted with the patient and is negative except as per HPI and below:  ROS     OBJECTIVE:  VS: There were no vitals taken for this visit.   Wt Readings from Last 3 Encounters:  02/26/19 173 lb 8 oz (78.7 kg)  02/17/19 173 lb 6.4 oz (78.7 kg)  02/12/19 173 lb 9.6 oz (78.7 kg)     EXAM: General: Pt appears well and is in NAD  Hydration: Well-hydrated with moist mucous membranes and good skin turgor  Eyes: External eye exam normal without stare, lid lag or exophthalmos.  EOM intact.  PERRL.  Ears, Nose, Throat: Hearing: Grossly intact bilaterally Dental: Good dentition  Throat: Clear without mass, erythema or exudate  Neck: General: Supple without adenopathy. Thyroid: Thyroid size normal.  No goiter or nodules appreciated. No thyroid bruit.  Lungs:  Clear with good BS bilat with no rales, rhonchi, or wheezes  Heart: Auscultation: RRR.  Abdomen: Normoactive bowel sounds, soft, nontender, without masses or organomegaly palpable  Extremities: Gait and station: Normal gait  Digits and nails: No clubbing, cyanosis, petechiae, or nodes Head and neck: Normal alignment and mobility BL UE: Normal ROM and strength. BL LE: No pretibial edema normal ROM and strength.  Skin: Hair: Texture and amount normal with gender appropriate distribution Skin Inspection: No rashes, acanthosis nigricans/skin tags. No lipohypertrophy Skin Palpation: Skin temperature, texture, and thickness normal to palpation  Neuro: Cranial nerves: II - XII grossly intact  Cerebellar: Normal coordination and movement; no tremor Motor: Normal strength throughout DTRs: 2+ and symmetric in UE without delay in relaxation phase  Mental Status: Judgment, insight: Intact Orientation: Oriented to time, place, and person Memory: Intact for recent and remote events Mood and affect: No depression, anxiety, or agitation     DATA REVIEWED: ***    ASSESSMENT/PLAN/RECOMMENDATIONS:   1. ***    Medications :  Signed electronically by: Mack Guise, MD  [facility] Endocrinology  [facility] [address] [place], [place] [address] Phone: [phone] FAX: [phone]   CC: Arnetha Courser, [address] [place] [address] Phone: [phone] Fax: [phone]   Return to Endocrinology clinic as below: Future Appointments  Date Time Provider [place]  03/11/2019 10:50 AM , Melanie Crazier, MD [facility] None  03/13/2019  8:00 AM [facility] FLUSH [facility] None  03/13/2019  8:30 AM Earlie Server, MD [facility] None  03/13/2019  9:00 AM [facility]- [facility] INFUSION CHAIR 20 [facility] None  03/19/2019 10:30 AM Borders, Kirt Boys, NP [facility] None  04/02/2019  2:30 PM [facility] GYN ONC [facility] None

## 2019-03-11 NOTE — Telephone Encounter (Signed)
Patient called cancer center requesting refill of Oxtcodone long acting q 12 hours.    As mandated by the [place] STOP Act (Strengthen Opioid Misuse Prevention), the [place] Controlled Substance Reporting System ([place]) was reviewed for this patient.  Below is the past 93-month of controlled substance prescriptions as displayed by the registry.  I have personally consulted with my supervising physician, Dr. BRogue Bussing who agrees that continuation of opiate therapy is medically appropriate at this time and agrees to provide continual monitoring, including urine/blood drug screens, as indicated. Refill is appropriate on or after 03/11/19.  NCCSRS reviewed: OLuther Redo NP 03/11/2019 1:21 PM [phone]

## 2019-03-11 NOTE — Telephone Encounter (Signed)
Hassan Rowan- Could you have her come to clinic to collect UA and I'll follow up with her based on results?

## 2019-03-11 NOTE — Telephone Encounter (Signed)
She will be there in 30 mins to give urine sample

## 2019-03-12 ENCOUNTER — Ambulatory Visit
Admission: RE | Admit: 2019-03-12 | Discharge: 2019-03-12 | Disposition: A | Discharge status: Home / Self Care | Payer: Medicaid Other | Source: Ambulatory Visit | Attending: Oncology | Admitting: Oncology

## 2019-03-12 ENCOUNTER — Telehealth: Payer: Self-pay | Admitting: *Deleted

## 2019-03-12 ENCOUNTER — Inpatient Hospital Stay (HOSPITAL_BASED_OUTPATIENT_CLINIC_OR_DEPARTMENT_OTHER): Payer: Medicaid Other | Admitting: Oncology

## 2019-03-12 ENCOUNTER — Inpatient Hospital Stay: Payer: Medicaid Other | Admitting: Hospice and Palliative Medicine

## 2019-03-12 ENCOUNTER — Other Ambulatory Visit: Payer: Self-pay

## 2019-03-12 ENCOUNTER — Encounter: Payer: Self-pay | Admitting: Oncology

## 2019-03-12 DIAGNOSIS — R109 Unspecified abdominal pain: Secondary | ICD-10-CM

## 2019-03-12 DIAGNOSIS — C7800 Secondary malignant neoplasm of unspecified lung: Secondary | ICD-10-CM

## 2019-03-12 NOTE — Telephone Encounter (Signed)
Patient called asking if we have results of UA yesterday because her back is still hurting her.   Urinalysis, Complete w Microscopic  Order: [id number]  Status:  Final result  Visible to patient:  No (Not Released)  Next appt:  03/13/2019 at 08:00 AM in Oncology ([facility]PORT FLUSH)  Dx:  Lower abdominal pain   Ref Range & Units 1d ago (03/11/19) 33moago (01/26/19) 1660mogo (01/20/19) 60m27moo (12/16/18) 42mo13mo (10/15/18) 81mo 87mo(10/07/18) 45mo a481mo11/23/19)  Color, Urine YELLOW YELLOWAbnormal   YELLOWAbnormal   YELLOWAbnormal   YELLOWAbnormal   AMBERAbnormal  CM YELLOWAbnormal   AMBERAbnormal  CM  APPearance CLEAR CLEARAbnormal   HAZYAbnormal   CLOUDYAbnormal   CLEARAbnormal   CLEARAbnormal   CLEARAbnormal   CLEARAbnormal    Specific Gravity, Urine 1.005 - 1.030 1.029  1.019  1.005  >1.046High   1.003Low   1.021  1.023   pH 5.0 - 8.0 5.0  6.0  7.0  6.0  7.0  5.0  6.0   Glucose, UA NEGATIVE mg/dL NEGATIVE  NEGATIVE  NEGATIVE  NEGATIVE  NEGATIVE  NEGATIVE  NEGATIVE   Hgb urine dipstick NEGATIVE NEGATIVE  NEGATIVE  SMALLAbnormal   MODERATEAbnormal   SMALLAbnormal   NEGATIVE  MODERATEAbnormal    Bilirubin Urine NEGATIVE SMALLAbnormal   NEGATIVE  NEGATIVE  NEGATIVE  NEGATIVE  NEGATIVE  NEGATIVE   Ketones, ur NEGATIVE mg/dL NEGATIVE  5Abnormal   NEGATIVE  20Abnormal   NEGATIVE  NEGATIVE  5Abnormal    Protein, ur NEGATIVE mg/dL NEGATIVE  30Abnormal   NEGATIVE  NEGATIVE  NEGATIVE  30Abnormal   NEGATIVE   Nitrite NEGATIVE NEGATIVE  NEGATIVE  NEGATIVE  NEGATIVE  POSITIVEAbnormal   NEGATIVE  NEGATIVE   Leukocytes,Ua NEGATIVE TRACEAbnormal   TRACEAbnormal   MODERATEAbnormal   NEGATIVE      RBC / HPF 0 - 5 RBC/hpf 0-5  0-5  0-5  11-20  0-5  0-5  21-50   WBC, UA 0 - 5 WBC/hpf 0-5  11-20  >50High   6-10  0-5  >50High   6-10   Bacteria, UA NONE SEEN NONE SEEN  NONE SEEN  MANYAbnormal   NONE SEEN  NONE SEEN  NONE SEEN  NONE SEEN   Squamous Epithelial / LPF 0 - 5 0-5  0-5  0-5  0-5  NONE SEEN CM NONE SEEN  NONE  SEEN   Mucus  PRESENT  PRESENT  PRESENT CM PRESENT CM  PRESENT CM PRESENT CM  Comment: Performed at [facility] [address] [id number]  CH CLIK[facility] LLCLAB CHButtersIMissouri ValleyLAB CH CLIMatamorasLAB CH CLIParlierLAB CH CLIUpper PohatcongLAB CH CLIDixieLAB CH CLIRenwickLAB      Specimen Collected: 03/11/19 15:37  Last Resulted: 03/11/19 16:18

## 2019-03-12 NOTE — Progress Notes (Signed)
Completed KUB because she was having right sided flank pain X 4 days- Negative UA. Scheduled to see Dr. Tasia Catchings tomorrow. Any further workup before tomorrow?   Faythe Casa, NP 03/12/2019 1:56 PM

## 2019-03-12 NOTE — Telephone Encounter (Signed)
I've reached out to Dr. Rogue Bussing and am waiting to hear back on next steps. UA is negative for infection.

## 2019-03-12 NOTE — Progress Notes (Signed)
   Virtual Visit via Video Note  I connected with Joanna Hall on 03/12/19 at  1:30 PM EDT by a video enabled telemedicine application and verified that I am speaking with the correct person using two identifiers.  Location: Patient: Home Provider: Home   I discussed the limitations of evaluation and management by telemedicine and the availability of in person appointments. The patient expressed understanding and agreed to proceed.  History of Present Illness: Joanna Hall presents for complaint of right sided flank pain.  States symptoms started approximately 4 days ago.  Pain is constant and rated 8 out of 10.  Called clinic yesterday and completed a urinalysis which was negative for UTI.  Denies fevers.  Positive for chills.  Denies dysuria or hematuria. Scheduled to be seen my Dr. Tasia Catchings tomorrow for Cycle 2 Day 8 single agent Taxol.     Observations/Objective:Right flank pain X 4 days. Constant. Negative UA.   Assessment and Plan: STAT KUB r/o kidney stone or obstruction.   KUB:No acute abnormalities. Some lumbar spine scoliosis and degenerative change noted. No kidney stones or obstruction.    Follow Up Instructions: Will call with results. Scheduled to be see and evaluated in clinic tomorrow.   Attempted to contact patient. Unavailable. Left voicemail.   Disposition: RTC tomorrow at scheduled. Continue pain medications as prescribed. No known cause for flank pain. If it continues, she may need continued work-up with additional iimaging.  I discussed the assessment and treatment plan with the patient. The patient was provided an opportunity to ask questions and all were answered. The patient agreed with the plan and demonstrated an understanding of the instructions.   The patient was advised to call back or seek an in-person evaluation if the symptoms worsen or if the condition fails to improve as anticipated.  I provided 15 minutes of non-face-to-face time during this  encounter.   Jacquelin Hawking, NP

## 2019-03-13 ENCOUNTER — Encounter: Payer: Self-pay | Admitting: Oncology

## 2019-03-13 ENCOUNTER — Inpatient Hospital Stay: Payer: Medicaid Other

## 2019-03-13 ENCOUNTER — Other Ambulatory Visit: Payer: Self-pay

## 2019-03-13 ENCOUNTER — Inpatient Hospital Stay (HOSPITAL_BASED_OUTPATIENT_CLINIC_OR_DEPARTMENT_OTHER): Payer: Medicaid Other | Admitting: Oncology

## 2019-03-13 VITALS — BP 120/81 | HR 86 | Temp 97.3°F | Resp 18 | Wt 169.0 lb

## 2019-03-13 DIAGNOSIS — C538 Malignant neoplasm of overlapping sites of cervix uteri: Secondary | ICD-10-CM

## 2019-03-13 DIAGNOSIS — E876 Hypokalemia: Secondary | ICD-10-CM | POA: Diagnosis not present

## 2019-03-13 DIAGNOSIS — C7800 Secondary malignant neoplasm of unspecified lung: Secondary | ICD-10-CM | POA: Diagnosis not present

## 2019-03-13 DIAGNOSIS — Z5111 Encounter for antineoplastic chemotherapy: Secondary | ICD-10-CM | POA: Diagnosis not present

## 2019-03-13 DIAGNOSIS — Z79899 Other long term (current) drug therapy: Secondary | ICD-10-CM

## 2019-03-13 DIAGNOSIS — Z923 Personal history of irradiation: Secondary | ICD-10-CM

## 2019-03-13 DIAGNOSIS — R197 Diarrhea, unspecified: Secondary | ICD-10-CM

## 2019-03-13 DIAGNOSIS — E860 Dehydration: Secondary | ICD-10-CM

## 2019-03-13 DIAGNOSIS — Z7189 Other specified counseling: Secondary | ICD-10-CM

## 2019-03-13 LAB — CBC WITH DIFFERENTIAL/PLATELET
Abs Immature Granulocytes: 0 K/uL (ref 0.00–0.07)
Basophils Absolute: 0 K/uL (ref 0.0–0.1)
Basophils Relative: 0 %
Eosinophils Absolute: 0 K/uL (ref 0.0–0.5)
Eosinophils Relative: 1 %
HCT: 31.2 % — ABNORMAL LOW (ref 36.0–46.0)
Hemoglobin: 10.3 g/dL — ABNORMAL LOW (ref 12.0–15.0)
Immature Granulocytes: 0 %
Lymphocytes Relative: 39 %
Lymphs Abs: 0.9 K/uL (ref 0.7–4.0)
MCH: 35.4 pg — ABNORMAL HIGH (ref 26.0–34.0)
MCHC: 33 g/dL (ref 30.0–36.0)
MCV: 107.2 fL — ABNORMAL HIGH (ref 80.0–100.0)
Monocytes Absolute: 0.2 K/uL (ref 0.1–1.0)
Monocytes Relative: 9 %
Neutro Abs: 1.2 K/uL — ABNORMAL LOW (ref 1.7–7.7)
Neutrophils Relative %: 51 %
Platelets: 154 K/uL (ref 150–400)
RBC: 2.91 MIL/uL — ABNORMAL LOW (ref 3.87–5.11)
RDW: 14.6 % (ref 11.5–15.5)
WBC: 2.4 K/uL — ABNORMAL LOW (ref 4.0–10.5)
nRBC: 0 % (ref 0.0–0.2)

## 2019-03-13 LAB — COMPREHENSIVE METABOLIC PANEL
ALT: 56 U/L — ABNORMAL HIGH (ref 0–44)
AST: 37 U/L (ref 15–41)
Albumin: 3.9 g/dL (ref 3.5–5.0)
Alkaline Phosphatase: 140 U/L — ABNORMAL HIGH (ref 38–126)
Anion gap: 7 (ref 5–15)
BUN: 10 mg/dL (ref 6–20)
CO2: 25 mmol/L (ref 22–32)
Calcium: 9.1 mg/dL (ref 8.9–10.3)
Chloride: 104 mmol/L (ref 98–111)
Creatinine, Ser: 0.82 mg/dL (ref 0.44–1.00)
GFR calc Af Amer: >60 mL/min (ref >60)
GFR calc non Af Amer: >60 mL/min (ref >60)
Glucose, Bld: 99 mg/dL (ref 70–99)
Potassium: 3.4 mmol/L — ABNORMAL LOW (ref 3.5–5.1)
Sodium: 136 mmol/L (ref 135–145)
Total Bilirubin: 0.5 mg/dL (ref 0.3–1.2)
Total Protein: 7.6 g/dL (ref 6.5–8.1)

## 2019-03-13 LAB — MAGNESIUM: Magnesium: 1.7 mg/dL (ref 1.7–2.4)

## 2019-03-13 MED ORDER — FAMOTIDINE IN NACL 20-0.9 MG/50ML-% IV SOLN
20.0000 mg | Freq: Once | INTRAVENOUS | Status: AC
  Administered 2019-03-13: 20 mg via INTRAVENOUS
  Filled 2019-03-13: qty 50

## 2019-03-13 MED ORDER — DEXAMETHASONE SODIUM PHOSPHATE 10 MG/ML IJ SOLN
10.0000 mg | Freq: Once | INTRAMUSCULAR | Status: AC
  Administered 2019-03-13: 10 mg via INTRAVENOUS
  Filled 2019-03-13: qty 1

## 2019-03-13 MED ORDER — DIPHENHYDRAMINE HCL 50 MG/ML IJ SOLN
50.0000 mg | Freq: Once | INTRAMUSCULAR | Status: AC
  Administered 2019-03-13: 11:00:00 50 mg via INTRAVENOUS
  Filled 2019-03-13: qty 1

## 2019-03-13 MED ORDER — SODIUM CHLORIDE 0.9 % IV SOLN
Freq: Once | INTRAVENOUS | Status: AC
  Administered 2019-03-13: 10:00:00 via INTRAVENOUS
  Filled 2019-03-13: qty 250

## 2019-03-13 MED ORDER — SODIUM CHLORIDE 0.9 % IV SOLN
65.0000 mg/m2 | Freq: Once | INTRAVENOUS | Status: AC
  Administered 2019-03-13: 126 mg via INTRAVENOUS
  Filled 2019-03-13: qty 21

## 2019-03-13 MED ORDER — SODIUM CHLORIDE 0.9% FLUSH
10.0000 mL | Freq: Once | INTRAVENOUS | Status: AC
  Administered 2019-03-13: 08:00:00 10 mL via INTRAVENOUS
  Filled 2019-03-13: qty 10

## 2019-03-13 MED ORDER — HEPARIN SOD (PORK) LOCK FLUSH 100 UNIT/ML IV SOLN
500.0000 [IU] | Freq: Once | INTRAVENOUS | Status: AC
  Administered 2019-03-13: 12:00:00 500 [IU] via INTRAVENOUS
  Filled 2019-03-13: qty 5

## 2019-03-13 NOTE — Progress Notes (Signed)
[facility] NOTE  Patient Care Team: Lada, Satira Anis, MD as PCP - General (Family Medicine) Mellody Drown, MD as Consulting Physician (Obstetrics and Gynecology) Lenor Coffin, MD as Attending Physician (Gynecology) Lequita Asal, MD as Medical Oncologist (Hematology and Oncology) [name], MD as Consulting Physician (Gastroenterology) Michael Boston, MD as Consulting Physician (General Surgery) Lovell Sheehan, MD as Consulting Physician (Orthopedic Surgery) Clent Jacks, RN as Registered Nurse  CHIEF COMPLAINTS/PURPOSE OF CONSULTATION: Evaluation prior to chemotherapy treatment for cervical cancer.  #  Oncology History   # dec 2017- CERVICAL ADENO CA [[place]]; Stage IB [Dr. Christene Slates at [facility] in [place], [place];No surgery- Chemo-RT;  # 2018-sep - lung nodules [in [place]]  # AUG 20th, 2019-#1 Carbo-Taxol s/p 6 cycles- [No avastin sec to blood clots]: Feb 2020-bilateral lung nodules with 1 cm in size. March 2020- finished carbo-taxol #7; poor tolerance to [place].  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  She received treatment from 11/2016-05/2017.  01/2017 cisplatin x 2 and carboplatin x 1 (01/29/2017) due to ARF and XRT.  XRT was followed by T & O on 02/01/2017 and T & N 02/10/2017 and 02/20/2017.  Course was complicated by 80 pound weight loss, nausea, vomiting, electrolyte wasting (potassium and magnesium).  She describes that.  Is been sick constantly requiring at  least 20 hospitalizations.  Follow-up CT chest and PET on 06/2017. Per patient, 'radiation worked' and no disease in the abdomen.  At that time she was noted to have lung nodules that were growing and follow-up imaging was scheduled for 10/2017.  She was admitted to hospital in [place] for small bowel obstruction which was managed conservatively and she was home for a week prior to traveling to [place] for Thanksgiving holiday where she has family.  She presented to ER in [place] on 08/2017 with nausea, vomiting, and lower abdominal pain.  Symptoms did not respond to conservative treatment.  CT on 08/26/2017 revealed small bowel obstruction with transition in the pelvis just superior to the uterus rather was a long segment of distal ileum with fatty wall thickening compatible with chronic inflammation and/or radiation enteritis. Imaging showed numerous pulmonary nodules consistent with metastatic disease. She underwent laparotomy and right ileocolectomy on 08/31/2017 at [facility].  Surgical findings revealed a thickened, matted, and scarred piece of distal small bowel close to the ileocecal valve.  She was discharged on 09/05/2017.  Pain markedly increased in intensity and imaging was performed on 09/11/2017 which revealed: Debris within the anterior abdominal wall incision concerning for infection versus packing material, s/p post ileo-colectomy with expected postoperative changes, mild colonic ileus, numerous pulmonary nodules highly concerning for metastatic disease, punctate nonobstructing nephrolithiasis.  Staples were removed and one was packed.  She was started on doxycycline.  Abdominal and pelvic CT without contrast on 09/11/2017 revealed debris within anterior abdominal wall incision concerning for infection, versus packing material.She is s/pileocolectomy with expected postoperative changes and mild colonic ileus. There were numerous pulmonary nodules highly concerning for  metastatic diseaseand punctate nonobstructing nephrolithiasis. She was readmitted on  09/12/2017. She describes the onset of lower abdominal pain on 09/09/2017. Pain markedly increased in intensity on 09/11/2017.  Staples were removed and the wound packed. She was started on doxycycline.  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  PET scan was ordered to evaluate enlarging lung nodules with concern for recurrent cervical cancer but scan was delayed due to insurance and need to be performed in [place].  Presented to ER on 12/03/2017 for abdominal pain and emesis.  Imaging concerning for worsening possible uterine infarct and she was admitted to hospital.  Pelvic MRI was unremarkable.  Remote scarring type changes of uterus thought to be possibly related to radiation.  She was discharged on 12/08/2017.  Underwent endoscopy and colonoscopy on 12/20/2017.    On 02/22/2018 she underwent laser ablation of condylomata around the anus and vagina under anesthesia with Dr. Johney Maine.   04/15/2018: Chest, abdomen, and pelvis CTrevealed innumerable (>100) cavitary nodules scattered in the lungs, moderately enlarging compared to the 11/08/2017 PET-CT, suspicious for metastatic disease. One index node in the RLL measures 1.0 x 1.1 cm (previously 0.6 x 0.6 cm). There were no new nodules. There was an ill-defined wall thickening in the rectosigmoid with surrounding stranding along fascia planes, probably sequela from prior radiation therapy. There was multilevel lumbar impingement due to spondylosis and degenerative disc disease.  There was heterogeneous enhancement in the uterus (some possibly from prior radiation therapy).  04/23/2018:PET scan revealednumerous scattered solid and cavitary nodules in the lungs stable increased in size compared to the prior PET-CT from 11/08/2017.Largest nodule was 1.1 cm in the LUL (SUV 1.9).These demonstratedlow-grade metabolic activity up to a maximum SUV of about 2.3, increased from 11/08/2017.   Case was discussed at tumor board on 04/25/2018. Consensus to pursue CT-guided biopsy (05/06/18) which revealed: Metastatic adenocarcinoma, morphologically consistent with cervical adenocarcinoma.  She has history of chronic hepatitis C which is managed by GI.  Hepatitis C genotype is 2a/2c.  She receives B12 injections for history of B12 deficiency.  On 04/24/2018 she underwent left total knee replacement with Dr. [name]. --------------------------------------------------------   DIAGNOSIS: Cervical adenocarcinoma  STAGE: IV        ;GOALS: Palliative  CURRENT/MOST RECENT THERAPY : Taxol single agent      Malignant neoplasm of overlapping sites of cervix ([place])   01/29/2019 -  Chemotherapy    The patient had PACLitaxel (TAXOL) 156 mg in sodium chloride 0.9 % 250 mL chemo infusion (</= 33m/m2), 80 mg/m2 = 156 mg, Intravenous,  Once, 2 of 4 cycles Dose modification: 65 mg/m2 (original dose 80 mg/m2, Cycle 1, Reason: Provider Judgment) Administration: 156 mg (01/29/2019), 156 mg (02/05/2019), 126 mg (02/12/2019), 126 mg (02/26/2019)  for chemotherapy treatment.       HISTORY OF PRESENTING ILLNESS:  Joanna GARSKE89y.o.  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  I am covering Dr. BRogue Bussingto see this patient today.   Last week's chemotherapy Taxol was delayed due to nausea, fatigue and weakness.  Patient was given supportive care.  #Today she feels much better.  Nausea has improved. Continue  to have chronic diarrhea. Denies any fever, chills, chest pain or abdominal pain.  Chronic lower abdomen pain at her baseline.  Review of Systems  Constitutional: Positive for malaise/fatigue. Negative for chills, diaphoresis, fever and weight loss.  HENT: Negative for nosebleeds and sore throat.   Eyes: Negative for double vision and redness.  Respiratory: Negative for cough, hemoptysis, sputum production, shortness of breath and wheezing.   Cardiovascular: Negative for chest pain, palpitations, orthopnea and leg swelling.  Gastrointestinal: Positive for abdominal pain and diarrhea. Negative for blood in stool, constipation, heartburn, melena, nausea and vomiting.  Genitourinary: Negative for dysuria, frequency and urgency.  Musculoskeletal: Negative for back pain, joint pain and myalgias.  Skin: Negative for itching and rash.  Neurological: Negative for dizziness, tingling, tremors, focal weakness, weakness and headaches.  Endo/Heme/Allergies: Does not bruise/bleed easily.  Psychiatric/Behavioral: Negative for depression and hallucinations. The patient is not nervous/anxious and does not have insomnia.      MEDICAL HISTORY:  Past Medical History:  Diagnosis Date  . Abdominal pain 06/10/2018  . Abnormal cervical Papanicolaou smear 09/18/2017  . Anxiety   . Aortic atherosclerosis ([place])   . Arthritis    neck and knees  . 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 Blood transfusion without reported diagnosis   . Cervical cancer ([place]) 09/2016   mets lung  . Chronic anal fissure   . Chronic diarrhea   . Dyspnea   . Erosive gastropathy 09/18/2017  . Factor V Leiden mutation ([place])   . Fecal incontinence   . Genital warts   . GERD (gastroesophageal reflux disease)   . Heart murmur   . Hematochezia   . Hemorrhoids   . 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 Hepatitis, chronic ([place]) 05/05/2017  . History of cancer chemotherapy    completed  06/2017  . History of Clostridium difficile infection    while undergoing chemo.  Negative test 10/2017  . Ileocolic anastomotic leak   . 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 Intestinal infection due to Clostridium difficile 09/18/2017  . Macrocytic anemia with vitamin B12 deficiency   . Multiple gastric ulcers   . Nausea vomiting and diarrhea   . Pancolitis ([place]) 07/27/2018  . Perianal condylomata   . Pneumonia    History of  . Pulmonary nodules   . Rectal bleeding   . Small bowel obstruction ([place]) 08/2017  . Stiff neck    limited right turn  . Vitamin D deficiency     SURGICAL HISTORY: Past Surgical History:  Procedure Laterality Date  . CHOLECYSTECTOMY    . COLON SURGERY  08/2017   resection  . COLONOSCOPY WITH PROPOFOL N/A 12/20/2017   Procedure: COLONOSCOPY WITH PROPOFOL;  Surgeon: [name], MD;  Location[facility] ENDOSCOPY;  Service: Gastroenterology;  Laterality: N/A;  . COLONOSCOPY WITH PROPOFOL N/A 07/30/2018   Procedure: COLONOSCOPY WITH PROPOFOL;  Surgeon: [name], MD;  Location: [facility] ENDOSCOPY;  Service: Gastroenterology;  Laterality: N/A;  . COLONOSCOPY WITH PROPOFOL N/A 10/10/2018   Procedure: COLONOSCOPY WITH PROPOFOL;  Surgeon: Lucilla Lame, MD;  Location: [facility] ENDOSCOPY;  Service: Endoscopy;  Laterality: N/A;  . DIAGNOSTIC LAPAROSCOPY    . ESOPHAGOGASTRODUODENOSCOPY (EGD) WITH PROPOFOL N/A 12/20/2017   Procedure: ESOPHAGOGASTRODUODENOSCOPY (EGD) WITH PROPOFOL;  Surgeon: [name], MD;  Location: [place];  Service: Gastroenterology;  Laterality: N/A;  . ESOPHAGOGASTRODUODENOSCOPY (EGD) WITH PROPOFOL  07/30/2018   Procedure: ESOPHAGOGASTRODUODENOSCOPY (EGD) WITH PROPOFOL;  Surgeon: [name], MD;  Location: [facility] ENDOSCOPY;  Service: Gastroenterology;;  . Otho Darner SIGMOIDOSCOPY N/A 11/21/2018   Procedure: FLEXIBLE SIGMOIDOSCOPY;  Surgeon: [name], MD;  Location: [facility] ENDOSCOPY;  Service:  Gastroenterology;  Laterality: N/A;  . LAPAROTOMY N/A 08/31/2017   Procedure: EXPLORATORY LAPAROTOMY for SBO, ileocolectomy, removal of piece of uterine wall;  Surgeon: [name], MD;  Location: [facility] ORS;  Service: General;  Laterality: N/A;  . LASER ABLATION CONDOLAMATA N/A 02/22/2018   Procedure: LASER ABLATION/REMOVAL OF [id number] AROUND ANUS AND VAGINA;  Surgeon: Michael Boston, MD;  Location: [place];  Service: General;  Laterality: N/A;  . OOPHORECTOMY    . PORTA CATH INSERTION N/A 05/13/2018   Procedure: PORTA CATH INSERTION;  Surgeon: Katha Cabal, MD;  Location: [place] CV LAB;  Service: Cardiovascular;  Laterality: N/A;  . SMALL INTESTINE SURGERY    . TANDEM RING INSERTION     x3  . THORACOTOMY    . TOTAL KNEE ARTHROPLASTY Left 04/24/2018   Procedure: TOTAL KNEE ARTHROPLASTY;  Surgeon: Lovell Sheehan, MD;  Location: [facility] ORS;  Service: Orthopedics;  Laterality: Left;    SOCIAL HISTORY: Social History   Socioeconomic History  . Marital status: Divorced    Spouse name: Not on file  . Number of children: Not on file  . Years of education: Not on file  . Highest education level: Not on file  Occupational History  . Not on file  Social Needs  . Financial resource strain: Not hard at all  . Food insecurity:    Worry: Never true    Inability: Never true  . Transportation needs:    Medical: No    Non-medical: No  Tobacco Use  . Smoking status: Former Smoker    Last attempt to quit: 10/16/2006    Years since quitting: 12.4  . Smokeless tobacco: Never Used  Substance and Sexual Activity  . Alcohol use: Not Currently    Frequency: Never    Comment: seldom  . Drug use: Yes    Types: Marijuana  . Sexual activity: Not Currently    Birth control/protection: Post-menopausal    Comment: Not Asked  Lifestyle  . Physical activity:    Days per week: Patient refused    Minutes per session: Patient refused  . Stress: Only a little   Relationships  . Social connections:    Talks on phone: Patient refused    Gets together: Patient refused    Attends religious service: Patient refused    Active member of club or organization: Patient refused    Attends meetings of clubs or organizations: Patient refused    Relationship status: Patient refused  . Intimate partner violence:    Fear of current or ex partner: No    Emotionally abused: No    Physically abused: No    Forced sexual activity: No  Other Topics Concern  . Not on file  Social History Narrative  . Not on file    FAMILY HISTORY: Family History  Problem Relation Age of Onset  . Hypertension Father   . Diabetes Father   . Alcohol abuse Daughter   . Hypertension Maternal Grandmother   . Diabetes Maternal Grandmother   . Diabetes Paternal Grandmother   . Hypertension Paternal Grandmother     ALLERGIES:  is allergic to ketamine.  MEDICATIONS:  Current Outpatient Medications  Medication Sig Dispense Refill  . amitriptyline (ELAVIL) 75 MG  tablet Take 1 tablet (75 mg total) by mouth at bedtime. 90 tablet 1  . budesonide (ENTOCORT EC) 3 MG 24 hr capsule Take 3 capsules (9 mg total) by mouth daily. 30 capsule 0  . Calcium Carb-Cholecalciferol (CALCIUM 500 +D) 500-400 MG-UNIT TABS Take 2 tablets by mouth daily.    . diphenoxylate-atropine (LOMOTIL) 2.5-0.025 MG tablet TAKE 2 TABLETS BY MOUTH FOUR TIMES A DAY 240 tablet 0  . famotidine (PEPCID) 20 MG tablet Take 1 tablet (20 mg total) by mouth 2 (two) times daily as needed for heartburn or indigestion.    . hyoscyamine (LEVBID) 0.375 MG 12 hr tablet Take 1 tablet (0.375 mg total)by mouth 2 (two) times daily. 60 tablet 0  . meclizine (ANTIVERT) 25 MG tablet Take 1 tablet (25 mg total) by mouth 2 (two) times daily as needed for dizziness. 15 tablet 0  . Multiple Vitamins-Minerals (MULTIVITAMIN WITH MINERALS) tablet Take 1 tablet by mouth daily. 30 tablet 0  . ondansetron (ZOFRAN ODT) 4 MG disintegrating tablet  Take 1 tablet (4 mg total) by mouth every 8 (eight) hours as needed for nausea or vomiting. 20 tablet 0  . oxyCODONE (OXYCONTIN) 15 mg 12 hr tablet Take 1 tablet (15 mg total) by mouth every 12 (twelve) hours. 30 tablet 0  . Oxycodone HCl 10 MG TABS Take 1 tablet (10 mg total) by mouth every 6 (six) hours as needed (for breakthrough pain). 90 tablet 0  . pantoprazole (PROTONIX) 40 MG tablet Take 1 tablet (40 mg total) by mouth daily. 90 tablet 0  . potassium chloride SA (K-DUR,KLOR-CON) 20 MEQ tablet Take 1 tablet (20 mEq total) by mouth daily. 30 tablet 3  . promethazine (PHENERGAN) 25 MG tablet Take 1 tablet (25 mg total) by mouth every 8 (eight) hours as needed for nausea or vomiting. 30 tablet 3  . rivaroxaban (XARELTO) 20 MG TABS tablet Take 1 tablet (20 mg total) by mouth daily with supper. 30 tablet 1  . VENTOLIN HFA 108 (90 Base) MCG/ACT inhaler Inhale 1-2 puffs into the lungs every 4 (four) hours as needed for shortness of breath. 1 Inhaler 1  . VIBERZI 100 MG TABS TAKE 1 TABLET BY MOUTH TWICE DAILY WITH A MEAL 60 tablet 0   No current facility-administered medications for this visit.    Facility-Administered Medications Ordered in Other Visits  Medication Dose Route Frequency Provider Last Rate Last Dose  . heparin lock flush 100 unit/mL  500 Units Intravenous Once Corcoran, Melissa C, MD      . heparin lock flush 100 unit/mL  500 Units Intravenous Once Brahmanday, Govinda R, MD      . sodium chloride 0.9 % 1,000 mL with potassium chloride 20 mEq infusion   Intravenous Once Honor Loh E, NP      . sodium chloride flush (NS) 0.9 % injection 10 mL  10 mL Intravenous Once Borders, Kirt Boys, NP          .  PHYSICAL EXAMINATION: ECOG PERFORMANCE STATUS: 1 - Symptomatic but completely ambulatory  Vitals:   03/13/19 0838  BP: 120/81  Pulse: 86  Resp: 18  Temp: (!) 97.3 F (36.3 C)  SpO2: 97%   Filed Weights   03/13/19 0838  Weight: 169 lb (76.7 kg)    Physical Exam   Constitutional: She is oriented to person, place, and time. No distress.  Alone.   Walk in   HENT:  Head: Normocephalic and atraumatic.  Mouth/Throat: No oropharyngeal exudate.  Eyes: Pupils  are equal, round, and reactive to light. EOM are normal. No scleral icterus.  Neck: Normal range of motion. Neck supple.  Cardiovascular: Normal rate and regular rhythm.  No murmur heard. Pulmonary/Chest: Effort normal and breath sounds normal. No respiratory distress. She has no rales. She exhibits no tenderness.  Abdominal: Soft. Bowel sounds are normal. She exhibits no distension. There is no abdominal tenderness.  Musculoskeletal: Normal range of motion.        General: No tenderness or edema.  Neurological: She is alert and oriented to person, place, and time. No cranial nerve deficit. She exhibits normal muscle tone. Coordination normal.  Skin: Skin is warm and dry. She is not diaphoretic. No erythema.  Psychiatric: Affect normal.     LABORATORY DATA:  I have reviewed the data as listed Lab Results  Component Value Date   WBC 2.6 (L) 03/10/2019   HGB 9.1 (L) 03/10/2019   HCT 27.8 (L) 03/10/2019   MCV 109.0 (H) 03/10/2019   PLT 143 (L) 03/10/2019   Recent Labs    06/11/18 0510  07/08/18 1035  10/21/18 0845  02/17/19 1049  02/24/19 1259 03/05/19 0850 03/10/19 2140  NA 135   < > 136   < > 134*   < > 135   < > 137 138 138  K 3.9   < > 4.2   < > 3.9   < > 4.2   < > 3.6 3.4* 3.6  CL 102   < > 107   < > 98   < > 102   < > 104 104 105  CO2 27   < > 22   < > 24   < > 26   < > 24 23 23   GLUCOSE 82   < > 119*   < > 111*   < > 94   < > 127* 109* 113*  BUN 11   < > 15   < > 17   < > 13   < > 10 14 14   CREATININE 0.63   < > 0.71   < > 0.91   < > 0.67   < > 0.81 0.77 0.77  CALCIUM 8.9   < > 8.7*   < > 9.6   < > 9.2   < > 8.9 9.0 8.5*  GFRNONAA >60   < > >60   < > >60   < > >60   < > >60 >60 >60  GFRAA >60   < > >60   < > >60   < > >60   < > >60 >60 >60  PROT 7.0   < > 6.5   < > 8.6*   < >  7.4  --  7.5 7.3  --   ALBUMIN 3.5   < > 3.4*   < > 4.4   < > 3.7  --  3.8 3.9  --   AST 41   < > 26   < > 48*   < > 43*  --  29 33  --   ALT 51*   < > 33   < > 67*   < > 62*  --  35 60*  --   ALKPHOS 171*   < > 132*   < > 185*   < > 136*  --  126 135*  --   BILITOT 0.7   < > 0.4   < > 1.4*   < > 0.6  --  0.4 0.5  --   BILIDIR 0.1  --  <0.1  --  0.3*  --   --   --   --   --   --   IBILI 0.6  --  NOT CALCULATED  --   --   --   --   --   --   --   --    < > = values in this interval not displayed.   \RADIOGRAPHIC STUDIES: I have personally reviewed the radiological images as listed and agreed with the findings in the report. 01/23/2019, CT abdomen pelvis with contrast No substantial interval change in exam.  No findings to explain the patient's history of pelvic pain.  Similar appearance of pulmonary metastasis identified in the lung bases.  Stable extrahepatic biliary duct dilation in this patient.  Wall thickening in the sigmoid colon and rectum is similar to prior.Perirectal and presacral edema also not substantially changed. Features likely reflect sequelae of radiation therapy.  ASSESSMENT & PLAN:  53 year old female with metastatic adenocarcinoma of cervix/lung mets presents for evaluation prior to Taxol treatment. 1. Malignant neoplasm metastatic to lung, unspecified laterality ([place])   2. Diarrhea, unspecified type   3. Encounter for antineoplastic chemotherapy   4. Hypomagnesemia   5. Hypokalemia     #Metastatic cervical cancer, Counts are reviewed and discussed with patient.  Counts acceptable to proceed with Taxol treatment today.  #Chronic diarrhea, advised patient to continue take Lomotil Viberzias instructed. #Hypokalemia,K 3.4,   recommend patient to continue take potassium chloride 20 mEq daily. #Hypo-magnesium, resolved.  Magnesium level is 1.7 today.  No need for IV magnesium today #Nausea/vomiting, resolved. #Mild neutropenia/likely secondary to chemotherapy.  Continue  to monitor. #Macrocytic anemia, chronic,moitor. MDS? Follow-up with palliative care for symptom management. Follow-up in 1 week with Dr. B for evaluation of Taxol treatment.  All questions were answered. The patient knows to call the clinic with any problems, questions or concerns.     Earlie Server, MD 03/13/2019 8:17 AM

## 2019-03-13 NOTE — Progress Notes (Signed)
Patient here for follow up. Pt states she has been having pain to right low back. Pt has chronic diarrhea, denies vomitting, but is nauseated. Pt says she has had poor appetite, has not ate in about 2 days.

## 2019-03-14 ENCOUNTER — Ambulatory Visit: Payer: Medicaid Other | Admitting: Family Medicine

## 2019-03-17 ENCOUNTER — Other Ambulatory Visit: Payer: Self-pay | Admitting: Gastroenterology

## 2019-03-17 ENCOUNTER — Other Ambulatory Visit: Payer: Self-pay | Admitting: *Deleted

## 2019-03-18 ENCOUNTER — Encounter: Payer: Self-pay | Admitting: Gastroenterology

## 2019-03-18 ENCOUNTER — Ambulatory Visit: Payer: Medicaid Other | Admitting: Internal Medicine

## 2019-03-18 MED ORDER — PANTOPRAZOLE SODIUM 40 MG PO TBEC: 40.0000 mg | DELAYED_RELEASE_TABLET | Freq: Every day | ORAL | 1 refills | Status: DC

## 2019-03-18 NOTE — Progress Notes (Deleted)
Name: Joanna Hall  MRN/ DOB: [id number], 21-Oct-1965    Age/ Sex: 53 y.o., female    PCP: Arnetha Courser, MD   Reason for Endocrinology Evaluation: Adrenal Insufficiency     Date of Initial Endocrinology Evaluation: 03/18/2019     HPI: Ms. Joanna Hall is a 53 y.o. female with a past medical history of stage IV cervical cancer (S/P chemo and radiation). The patient presented for initial endocrinology clinic visit on 03/18/2019 for consultative assistance with her ***.   ***  HISTORY:  Past Medical History:  Past Medical History:  Diagnosis Date  . Abdominal pain 06/10/2018  . Abnormal cervical Papanicolaou smear 09/18/2017  . Anxiety   . Aortic atherosclerosis ([place])   . Arthritis    neck and knees  . 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 Blood transfusion without reported diagnosis   . Cervical cancer ([place]) 09/2016   mets lung  . Chronic anal fissure   . Chronic diarrhea   . Dyspnea   . Erosive gastropathy 09/18/2017  . Factor V Leiden mutation ([place])   . Fecal incontinence   . Genital warts   . GERD (gastroesophageal reflux disease)   . Heart murmur   . Hematochezia   . Hemorrhoids   . 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 Hepatitis, chronic ([place]) 05/05/2017  . History of cancer chemotherapy    completed 06/2017  . History of Clostridium difficile infection    while undergoing chemo.  Negative test 10/2017  . Ileocolic anastomotic leak   . 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 Intestinal infection due to Clostridium difficile 09/18/2017  . Macrocytic anemia with vitamin B12 deficiency   . Multiple gastric ulcers   . Nausea vomiting and diarrhea   . Pancolitis ([place]) 07/27/2018  . Perianal condylomata   . Pneumonia    History of  . Pulmonary nodules   . Rectal bleeding   . Small bowel obstruction ([place]) 08/2017  . Stiff neck    limited right turn  . Vitamin D deficiency    Past Surgical  History:  Past Surgical History:  Procedure Laterality Date  . CHOLECYSTECTOMY    . COLON SURGERY  08/2017   resection  . COLONOSCOPY WITH PROPOFOL N/A 12/20/2017   Procedure: COLONOSCOPY WITH PROPOFOL;  Surgeon: [name], MD;  Location: [facility] ENDOSCOPY;  Service: Gastroenterology;  Laterality: N/A;  . COLONOSCOPY WITH PROPOFOL N/A 07/30/2018   Procedure: COLONOSCOPY WITH PROPOFOL;  Surgeon: [name], MD;  Location: [facility] ENDOSCOPY;  Service: Gastroenterology;  Laterality: N/A;  . COLONOSCOPY WITH PROPOFOL N/A 10/10/2018   Procedure: COLONOSCOPY WITH PROPOFOL;  Surgeon: Lucilla Lame, MD;  Location: [facility] ENDOSCOPY;  Service: Endoscopy;  Laterality: N/A;  . DIAGNOSTIC LAPAROSCOPY    . ESOPHAGOGASTRODUODENOSCOPY (EGD) WITH PROPOFOL N/A 12/20/2017   Procedure: ESOPHAGOGASTRODUODENOSCOPY (EGD) WITH PROPOFOL;  Surgeon: [name], MD;  Location: [place];  Service: Gastroenterology;  Laterality: N/A;  . ESOPHAGOGASTRODUODENOSCOPY (EGD) WITH PROPOFOL  07/30/2018   Procedure: ESOPHAGOGASTRODUODENOSCOPY (EGD) WITH PROPOFOL;  Surgeon: [name], MD;  Location: [facility] ENDOSCOPY;  Service: Gastroenterology;;  . Otho Darner SIGMOIDOSCOPY N/A 11/21/2018   Procedure: FLEXIBLE SIGMOIDOSCOPY;  Surgeon: [name], MD;  Location: [facility] ENDOSCOPY;  Service: Gastroenterology;  Laterality: N/A;  . LAPAROTOMY N/A 08/31/2017   Procedure: EXPLORATORY LAPAROTOMY for SBO, ileocolectomy, removal of piece of uterine  wall;  Surgeon: [name], MD;  Location: [facility] ORS;  Service: General;  Laterality: N/A;  . LASER ABLATION CONDOLAMATA N/A 02/22/2018   Procedure: LASER ABLATION/REMOVAL OF [id number] AROUND ANUS AND VAGINA;  Surgeon: Michael Boston, MD;  Location: [facility];  Service: General;  Laterality: N/A;  . OOPHORECTOMY    . PORTA CATH INSERTION N/A 05/13/2018   Procedure: PORTA CATH INSERTION;  Surgeon: Katha Cabal, MD;  Location: [facility] CV  LAB;  Service: Cardiovascular;  Laterality: N/A;  . SMALL INTESTINE SURGERY    . TANDEM RING INSERTION     x3  . THORACOTOMY    . TOTAL KNEE ARTHROPLASTY Left 04/24/2018   Procedure: TOTAL KNEE ARTHROPLASTY;  Surgeon: Lovell Sheehan, MD;  Location: [facility] ORS;  Service: Orthopedics;  Laterality: Left;      Social History:  reports that she quit smoking about 12 years ago. She has never used smokeless tobacco. She reports previous alcohol use. She reports current drug use. Drug: Marijuana.  Family History: family history includes Alcohol abuse in her daughter; Diabetes in her father, maternal grandmother, and paternal grandmother; Hypertension in her father, maternal grandmother, and paternal grandmother.   HOME MEDICATIONS: Allergies as of 03/18/2019      Reactions   Ketamine Anxiety, Other (See Comments)   Syncope episode/confusion      Medication List       Accurate as of March 18, 2019  8:31 AM. If you have any questions, ask your nurse or doctor.        amitriptyline 75 MG tablet Commonly known as:  ELAVIL Take 1 tablet (75 mg total) by mouth at bedtime.   budesonide 3 MG 24 hr capsule Commonly known as:  ENTOCORT EC Take 3 capsules (9 mg total) by mouth daily.   Calcium 500 +D 500-400 MG-UNIT Tabs Generic drug:  Calcium Carb-Cholecalciferol Take 2 tablets by mouth daily.   diphenoxylate-atropine 2.5-0.025 MG tablet Commonly known as:  LOMOTIL TAKE 2 TABLETS BY MOUTH FOUR TIMES A DAY   famotidine 20 MG tablet Commonly known as:  PEPCID Take 1 tablet (20 mg total) by mouth 2 (two) times daily as needed for heartburn or indigestion.   hyoscyamine 0.375 MG 12 hr tablet Commonly known as:  LEVBID Take 1 tablet (0.375 mg total)by mouth 2 (two) times daily.   meclizine 25 MG tablet Commonly known as:  ANTIVERT Take 1 tablet (25 mg total) by mouth 2 (two) times daily as needed for dizziness.   multivitamin with minerals tablet Take 1 tablet by mouth daily.    ondansetron 4 MG disintegrating tablet Commonly known as:  Zofran ODT Take 1 tablet (4 mg total) by mouth every 8 (eight) hours as needed for nausea or vomiting.   Oxycodone HCl 10 MG Tabs Take 1 tablet (10 mg total) by mouth every 6 (six) hours as needed (for breakthrough pain).   oxyCODONE 15 mg 12 hr tablet Commonly known as:  OXYCONTIN Take 1 tablet (15 mg total) by mouth every 12 (twelve) hours.   pantoprazole 40 MG tablet Commonly known as:  PROTONIX Take 1 tablet (40 mg total) by mouth daily.   potassium chloride SA 20 MEQ tablet Commonly known as:  K-DUR Take 1 tablet (20 mEq total) by mouth daily.   promethazine 25 MG tablet Commonly known as:  PHENERGAN Take 1 tablet (25 mg total) by mouth every 8 (eight) hours as needed for nausea or vomiting.   rivaroxaban 20 MG Tabs tablet Commonly  known as:  XARELTO Take 1 tablet (20 mg total) by mouth daily with supper.   Ventolin HFA 108 (90 Base) MCG/ACT inhaler Generic drug:  albuterol Inhale 1-2 puffs into the lungs every 4 (four) hours as needed for shortness of breath.   Viberzi 100 MG Tabs Generic drug:  Eluxadoline TAKE 1 TABLET BY MOUTH TWICE DAILY WITH A MEAL         REVIEW OF SYSTEMS: A comprehensive ROS was conducted with the patient and is negative except as per HPI and below:  ROS     OBJECTIVE:  VS: There were no vitals taken for this visit.   Wt Readings from Last 3 Encounters:  03/13/19 169 lb (76.7 kg)  02/26/19 173 lb 8 oz (78.7 kg)  02/17/19 173 lb 6.4 oz (78.7 kg)     EXAM: General: Pt appears well and is in NAD  Hydration: Well-hydrated with moist mucous membranes and good skin turgor  Eyes: External eye exam normal without stare, lid lag or exophthalmos.  EOM intact.  PERRL.  Ears, Nose, Throat: Hearing: Grossly intact bilaterally Dental: Good dentition  Throat: Clear without mass, erythema or exudate  Neck: General: Supple without adenopathy. Thyroid: Thyroid size normal.  No  goiter or nodules appreciated. No thyroid bruit.  Lungs: Clear with good BS bilat with no rales, rhonchi, or wheezes  Heart: Auscultation: RRR.  Abdomen: Normoactive bowel sounds, soft, nontender, without masses or organomegaly palpable  Extremities: Gait and station: Normal gait  Digits and nails: No clubbing, cyanosis, petechiae, or nodes Head and neck: Normal alignment and mobility BL UE: Normal ROM and strength. BL LE: No pretibial edema normal ROM and strength.  Skin: Hair: Texture and amount normal with gender appropriate distribution Skin Inspection: No rashes, acanthosis nigricans/skin tags. No lipohypertrophy Skin Palpation: Skin temperature, texture, and thickness normal to palpation  Neuro: Cranial nerves: II - XII grossly intact  Cerebellar: Normal coordination and movement; no tremor Motor: Normal strength throughout DTRs: 2+ and symmetric in UE without delay in relaxation phase  Mental Status: Judgment, insight: Intact Orientation: Oriented to time, place, and person Memory: Intact for recent and remote events Mood and affect: No depression, anxiety, or agitation     DATA REVIEWED: ***    ASSESSMENT/PLAN/RECOMMENDATIONS:   1. ***    Medications :  Signed electronically by: Mack Guise, MD  [facility] Endocrinology  [facility] [address] [place], [place] [address] Phone: [phone] FAX: [phone]   CC: Arnetha Courser, [address] [place] [address] Phone: [phone] Fax: [phone]   Return to Endocrinology clinic as below: Future Appointments  Date Time Provider [place]  03/18/2019  9:50 AM Destine Ambroise, Melanie Crazier, MD [facility] None  03/20/2019  8:15 AM [facility] None  03/20/2019  8:30 AM Cammie Sickle, MD [facility] None  03/20/2019  8:45 AM Borders, Kirt Boys, NP [facility] None  03/20/2019  9:00 AM [facility] INFUSION CHAIR 16 [facility] None   04/02/2019  2:30 PM [facility] GYN ONC [facility] None

## 2019-03-19 ENCOUNTER — Encounter: Payer: Medicaid Other | Admitting: Hospice and Palliative Medicine

## 2019-03-20 ENCOUNTER — Inpatient Hospital Stay: Payer: Medicaid Other

## 2019-03-20 ENCOUNTER — Encounter: Payer: Self-pay | Admitting: Gastroenterology

## 2019-03-20 ENCOUNTER — Other Ambulatory Visit: Payer: Self-pay | Admitting: Nurse Practitioner

## 2019-03-20 ENCOUNTER — Encounter: Payer: Self-pay | Admitting: Nurse Practitioner

## 2019-03-20 ENCOUNTER — Other Ambulatory Visit: Payer: Self-pay

## 2019-03-20 ENCOUNTER — Inpatient Hospital Stay: Payer: Medicaid Other | Attending: Internal Medicine

## 2019-03-20 ENCOUNTER — Inpatient Hospital Stay: Payer: Medicaid Other | Admitting: Nurse Practitioner

## 2019-03-20 ENCOUNTER — Inpatient Hospital Stay (HOSPITAL_BASED_OUTPATIENT_CLINIC_OR_DEPARTMENT_OTHER): Payer: Medicaid Other | Admitting: Internal Medicine

## 2019-03-20 ENCOUNTER — Other Ambulatory Visit: Payer: Self-pay | Admitting: Gastroenterology

## 2019-03-20 VITALS — BP 113/77 | HR 80 | Temp 97.5°F | Resp 20 | Ht 69.0 in | Wt 170.0 lb

## 2019-03-20 DIAGNOSIS — G620 Drug-induced polyneuropathy: Secondary | ICD-10-CM | POA: Diagnosis not present

## 2019-03-20 DIAGNOSIS — K56609 Unspecified intestinal obstruction, unspecified as to partial versus complete obstruction: Secondary | ICD-10-CM | POA: Insufficient documentation

## 2019-03-20 DIAGNOSIS — Z90722 Acquired absence of ovaries, bilateral: Secondary | ICD-10-CM | POA: Insufficient documentation

## 2019-03-20 DIAGNOSIS — C538 Malignant neoplasm of overlapping sites of cervix uteri: Secondary | ICD-10-CM

## 2019-03-20 DIAGNOSIS — Z9221 Personal history of antineoplastic chemotherapy: Secondary | ICD-10-CM | POA: Insufficient documentation

## 2019-03-20 DIAGNOSIS — C7800 Secondary malignant neoplasm of unspecified lung: Secondary | ICD-10-CM | POA: Diagnosis not present

## 2019-03-20 DIAGNOSIS — C539 Malignant neoplasm of cervix uteri, unspecified: Secondary | ICD-10-CM | POA: Insufficient documentation

## 2019-03-20 DIAGNOSIS — T451X5A Adverse effect of antineoplastic and immunosuppressive drugs, initial encounter: Secondary | ICD-10-CM | POA: Diagnosis not present

## 2019-03-20 DIAGNOSIS — Z7189 Other specified counseling: Secondary | ICD-10-CM

## 2019-03-20 DIAGNOSIS — Z923 Personal history of irradiation: Secondary | ICD-10-CM | POA: Diagnosis not present

## 2019-03-20 DIAGNOSIS — Z86718 Personal history of other venous thrombosis and embolism: Secondary | ICD-10-CM | POA: Diagnosis not present

## 2019-03-20 DIAGNOSIS — Z86711 Personal history of pulmonary embolism: Secondary | ICD-10-CM | POA: Insufficient documentation

## 2019-03-20 DIAGNOSIS — Z5111 Encounter for antineoplastic chemotherapy: Secondary | ICD-10-CM | POA: Diagnosis present

## 2019-03-20 DIAGNOSIS — Z7901 Long term (current) use of anticoagulants: Secondary | ICD-10-CM | POA: Insufficient documentation

## 2019-03-20 DIAGNOSIS — E2740 Unspecified adrenocortical insufficiency: Secondary | ICD-10-CM | POA: Diagnosis not present

## 2019-03-20 DIAGNOSIS — Z452 Encounter for adjustment and management of vascular access device: Secondary | ICD-10-CM | POA: Diagnosis not present

## 2019-03-20 DIAGNOSIS — K529 Noninfective gastroenteritis and colitis, unspecified: Secondary | ICD-10-CM

## 2019-03-20 DIAGNOSIS — Z515 Encounter for palliative care: Secondary | ICD-10-CM

## 2019-03-20 DIAGNOSIS — Z7952 Long term (current) use of systemic steroids: Secondary | ICD-10-CM | POA: Diagnosis not present

## 2019-03-20 LAB — COMPREHENSIVE METABOLIC PANEL
ALT: 35 U/L (ref 0–44)
AST: 29 U/L (ref 15–41)
Albumin: 3.6 g/dL (ref 3.5–5.0)
Alkaline Phosphatase: 132 U/L — ABNORMAL HIGH (ref 38–126)
Anion gap: 7 (ref 5–15)
BUN: 14 mg/dL (ref 6–20)
CO2: 26 mmol/L (ref 22–32)
Calcium: 9.1 mg/dL (ref 8.9–10.3)
Chloride: 106 mmol/L (ref 98–111)
Creatinine, Ser: 0.83 mg/dL (ref 0.44–1.00)
GFR calc Af Amer: >60 mL/min (ref >60)
GFR calc non Af Amer: >60 mL/min (ref >60)
Glucose, Bld: 124 mg/dL — ABNORMAL HIGH (ref 70–99)
Potassium: 3.5 mmol/L (ref 3.5–5.1)
Sodium: 139 mmol/L (ref 135–145)
Total Bilirubin: 0.4 mg/dL (ref 0.3–1.2)
Total Protein: 7.1 g/dL (ref 6.5–8.1)

## 2019-03-20 LAB — CBC WITH DIFFERENTIAL/PLATELET
Abs Immature Granulocytes: 0.01 10*3/uL (ref 0.00–0.07)
Basophils Absolute: 0 10*3/uL (ref 0.0–0.1)
Basophils Relative: 0 %
Eosinophils Absolute: 0 10*3/uL (ref 0.0–0.5)
Eosinophils Relative: 1 %
HCT: 28.9 % — ABNORMAL LOW (ref 36.0–46.0)
Hemoglobin: 9.6 g/dL — ABNORMAL LOW (ref 12.0–15.0)
Immature Granulocytes: 0 %
Lymphocytes Relative: 25 %
Lymphs Abs: 0.7 10*3/uL (ref 0.7–4.0)
MCH: 35.3 pg — ABNORMAL HIGH (ref 26.0–34.0)
MCHC: 33.2 g/dL (ref 30.0–36.0)
MCV: 106.3 fL — ABNORMAL HIGH (ref 80.0–100.0)
Monocytes Absolute: 0.2 10*3/uL (ref 0.1–1.0)
Monocytes Relative: 6 %
Neutro Abs: 1.8 10*3/uL (ref 1.7–7.7)
Neutrophils Relative %: 68 %
Platelets: 136 10*3/uL — ABNORMAL LOW (ref 150–400)
RBC: 2.72 MIL/uL — ABNORMAL LOW (ref 3.87–5.11)
RDW: 14.1 % (ref 11.5–15.5)
WBC: 2.6 10*3/uL — ABNORMAL LOW (ref 4.0–10.5)
nRBC: 0 % (ref 0.0–0.2)

## 2019-03-20 LAB — MAGNESIUM: Magnesium: 1.6 mg/dL — ABNORMAL LOW (ref 1.7–2.4)

## 2019-03-20 MED ORDER — HEPARIN SOD (PORK) LOCK FLUSH 100 UNIT/ML IV SOLN
500.0000 [IU] | Freq: Once | INTRAVENOUS | Status: DC | PRN
  Filled 2019-03-20 (×2): qty 5

## 2019-03-20 MED ORDER — HEPARIN SOD (PORK) LOCK FLUSH 100 UNIT/ML IV SOLN
500.0000 [IU] | Freq: Once | INTRAVENOUS | Status: AC
  Administered 2019-03-20: 14:00:00 500 [IU] via INTRAVENOUS

## 2019-03-20 MED ORDER — DEXAMETHASONE SODIUM PHOSPHATE 10 MG/ML IJ SOLN
10.0000 mg | Freq: Once | INTRAMUSCULAR | Status: AC
  Administered 2019-03-20: 10 mg via INTRAVENOUS
  Filled 2019-03-20: qty 1

## 2019-03-20 MED ORDER — BUDESONIDE 3 MG PO CPEP: 9.0000 mg | ORAL_CAPSULE | Freq: Every day | ORAL | 0 refills | Status: DC

## 2019-03-20 MED ORDER — SODIUM CHLORIDE 0.9 % IV SOLN
65.0000 mg/m2 | Freq: Once | INTRAVENOUS | Status: AC
  Administered 2019-03-20: 13:00:00 126 mg via INTRAVENOUS
  Filled 2019-03-20: qty 21

## 2019-03-20 MED ORDER — ONDANSETRON 4 MG PO TBDP: 4.0000 mg | ORAL_TABLET | Freq: Three times a day (TID) | ORAL | 2 refills | Status: DC | PRN

## 2019-03-20 MED ORDER — FAMOTIDINE IN NACL 20-0.9 MG/50ML-% IV SOLN
20.0000 mg | Freq: Once | INTRAVENOUS | Status: AC
  Administered 2019-03-20: 12:00:00 20 mg via INTRAVENOUS
  Filled 2019-03-20: qty 50

## 2019-03-20 MED ORDER — OXYCODONE HCL 10 MG PO TABS: 10.0000 mg | ORAL_TABLET | Freq: Four times a day (QID) | ORAL | 0 refills | Status: DC | PRN

## 2019-03-20 MED ORDER — SODIUM CHLORIDE 0.9 % IV SOLN
Freq: Once | INTRAVENOUS | Status: AC
  Administered 2019-03-20: 12:00:00 via INTRAVENOUS
  Filled 2019-03-20: qty 250

## 2019-03-20 MED ORDER — SODIUM CHLORIDE 0.9% FLUSH
10.0000 mL | INTRAVENOUS | Status: DC | PRN
  Administered 2019-03-20: 10 mL via INTRAVENOUS
  Filled 2019-03-20: qty 10

## 2019-03-20 MED ORDER — DIPHENHYDRAMINE HCL 50 MG/ML IJ SOLN
50.0000 mg | Freq: Once | INTRAMUSCULAR | Status: AC
  Administered 2019-03-20: 50 mg via INTRAVENOUS
  Filled 2019-03-20: qty 1

## 2019-03-20 NOTE — Progress Notes (Signed)
[place]  Telephone:([phone] [phone]   Name: Joanna Hall Date: 03/20/2019 MRN: [id number]  DOB: [birth year]  Patient Care Team: Arnetha Courser, MD as PCP - General (Family Medicine) Mellody Drown, MD as Consulting Physician (Obstetrics and Gynecology) Lenor Coffin, MD as Attending Physician (Gynecology) Lequita Asal, MD as Medical Oncologist (Hematology and Oncology) [name], MD as Consulting Physician (Gastroenterology) Michael Boston, MD as Consulting Physician (General Surgery) Lovell Sheehan, MD as Consulting Physician (Orthopedic Surgery) Clent Jacks, RN as Registered Nurse    REASON FOR CONSULTATION: Palliative Care consult requested for this 53 y.o. 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  PMH also notable for history of right hemicolectomy with chronic inflammatory changes to the colon.  Patient is followed by GI.  She was recently hospitalized 11/19/2020 11/25/2018 with GI bleed.  Patient underwent flex sig on 2/6 but was mostly unremarkable except edematous mucosa.  Patient has chronic abdominal pain.  She presented to the ER on 12/16/2018 with acute on chronic intractable abdominal pain.  Patient had CT angio of the abdomen/pelvis, which revealed mucosal enhancement and wall thickening in the rectum and sigmoid colon but was essentially unchanged from previous studies.  Palliative care was consulted to help address symptoms and goals.  SOCIAL HISTORY:    Patient is not married.  She lives at home with her mother and father.  She has an adult daughter who lives in [place], [place].  She had another daughter who is now deceased.  ADVANCE DIRECTIVES:  Not on file   CODE STATUS:   PAST MEDICAL HISTORY: Past Medical History:  Diagnosis Date   Abdominal pain  06/10/2018   Abnormal cervical Papanicolaou smear 09/18/2017   Anxiety    Aortic atherosclerosis (HCC)    Arthritis    neck and knees   Blood clots in brain    both lungs and right kidney   Blood transfusion without reported diagnosis    Cervical cancer ([place]) 09/2016   mets lung   Chronic anal fissure    Chronic diarrhea    Dyspnea    Erosive gastropathy 09/18/2017   Factor V Leiden mutation ([place])    Fecal incontinence    Genital warts    GERD (gastroesophageal reflux disease)    Heart murmur    Hematochezia    Hemorrhoids    Hepatitis C    Chronic, 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   Intestinal infection due to Clostridium difficile 09/18/2017   Macrocytic anemia with vitamin B12 deficiency    Multiple gastric ulcers    Nausea vomiting and diarrhea    Pancolitis ([place]) 07/27/2018   Perianal condylomata    Pneumonia    History of   Pulmonary nodules    Rectal bleeding    Small bowel obstruction ([place]) 08/2017   Stiff neck    limited right turn   Vitamin D deficiency     PAST SURGICAL HISTORY:  Past Surgical History:  Procedure Laterality Date   CHOLECYSTECTOMY     COLON SURGERY  08/2017  resection   COLONOSCOPY WITH PROPOFOL N/A 12/20/2017   Procedure: COLONOSCOPY WITH PROPOFOL;  Surgeon: [name], MD;  Location: [facility] ENDOSCOPY;  Service: Gastroenterology;  Laterality: N/A;   COLONOSCOPY WITH PROPOFOL N/A 07/30/2018   Procedure: COLONOSCOPY WITH PROPOFOL;  Surgeon: [name], MD;  Location: [facility] ENDOSCOPY;  Service: Gastroenterology;  Laterality: N/A;   COLONOSCOPY WITH PROPOFOL N/A 10/10/2018   Procedure: COLONOSCOPY WITH PROPOFOL;   Surgeon: Lucilla Lame, MD;  Location: [facility] ENDOSCOPY;  Service: Endoscopy;  Laterality: N/A;   DIAGNOSTIC LAPAROSCOPY     ESOPHAGOGASTRODUODENOSCOPY (EGD) WITH PROPOFOL N/A 12/20/2017   Procedure: ESOPHAGOGASTRODUODENOSCOPY (EGD) WITH PROPOFOL;  Surgeon: [name], MD;  Location: [facility] ENDOSCOPY;  Service: Gastroenterology;  Laterality: N/A;   ESOPHAGOGASTRODUODENOSCOPY (EGD) WITH PROPOFOL  07/30/2018   Procedure: ESOPHAGOGASTRODUODENOSCOPY (EGD) WITH PROPOFOL;  Surgeon: [name], MD;  Location: [facility] ENDOSCOPY;  Service: Gastroenterology;;   [place] N/A 11/21/2018   Procedure: FLEXIBLE SIGMOIDOSCOPY;  Surgeon: [name], MD;  Location: [facility] ENDOSCOPY;  Service: Gastroenterology;  Laterality: N/A;   LAPAROTOMY N/A 08/31/2017   Procedure: EXPLORATORY LAPAROTOMY for SBO, ileocolectomy, removal of piece of uterine wall;  Surgeon: [name], MD;  Location: [facility] ORS;  Service: General;  Laterality: N/A;   LASER ABLATION CONDOLAMATA N/A 02/22/2018   Procedure: LASER ABLATION/REMOVAL OF [id number] AROUND ANUS AND VAGINA;  Surgeon: Michael Boston, MD;  Location: [place];  Service: General;  Laterality: N/A;   OOPHORECTOMY     PORTA CATH INSERTION N/A 05/13/2018   Procedure: PORTA CATH INSERTION;  Surgeon: Katha Cabal, MD;  Location: [place] CV LAB;  Service: Cardiovascular;  Laterality: N/A;   SMALL INTESTINE SURGERY     TANDEM RING INSERTION     x3   THORACOTOMY     TOTAL KNEE ARTHROPLASTY Left 04/24/2018   Procedure: TOTAL KNEE ARTHROPLASTY;  Surgeon: Lovell Sheehan, MD;  Location: [facility] ORS;  Service: Orthopedics;  Laterality: Left;    HEMATOLOGY/ONCOLOGY HISTORY:  Oncology History   # dec 2017- CERVICAL ADENO CA [[place]oor]; Stage IB [Dr. Christene Slates at [facility] in [place];No surgery- Chemo-RT;  # 2018-sep - lung nodules [in [place]]  # AUG 20th, 2019-#1 Carbo-Taxol  s/p 6 cycles- [No avastin sec to blood clots]: Feb 2020-bilateral lung nodules with 1 cm in size. March 2020- finished carbo-taxol #7; poor tolerance to [place].  # April 15 th 2020- Start Taxol weekly x 3; one week OFF  # summer-/fall 2019-Bil PE/kidney infract; factor V Leiden Leiden - xarelto [small bowel obstruction]; moved to [facility]   #Fall 2019 PE/renal infarct on Xarelto-factor V Leiden  # NGS/[facility]- PDL CPS 15; NEG for other targets**  ------------------------------------------------------------- She was treated by  Decision was made to pursue concurrent chemotherapy (weekly cisplatin) and radiation.  She received treatment from 11/2016-05/2017.  01/2017 cisplatin x 2 and carboplatin x 1 (01/29/2017) due to ARF and XRT.  XRT was followed by T & O on 02/01/2017 and T & N 02/10/2017 and 02/20/2017.  Course was complicated by 80 pound weight loss, nausea, vomiting, electrolyte wasting (potassium and magnesium).  She describes that.  Is been sick constantly requiring at least 20 hospitalizations.  Follow-up CT chest and PET on 06/2017. Per patient, 'radiation worked' and no disease in the abdomen.  At that time she was noted to have lung nodules that were growing and follow-up imaging was scheduled for 10/2017.  She was admitted to hospital in [place] for  small bowel obstruction which was managed conservatively and she was home for a week prior to traveling to [place] for Thanksgiving holiday where she has family.  She presented to ER in [place] on 08/2017 with nausea, vomiting, and lower abdominal pain.  Symptoms did not respond to conservative treatment.  CT on 08/26/2017 revealed small bowel obstruction with transition in the pelvis just superior to the uterus rather was a long segment of distal ileum with fatty wall thickening compatible with chronic inflammation and/or radiation enteritis. Imaging showed numerous pulmonary nodules consistent with metastatic disease. She  underwent laparotomy and right ileocolectomy on 08/31/2017 at [facility].  Surgical findings revealed a thickened, matted, and scarred piece of distal small bowel close to the ileocecal valve.  She was discharged on 09/05/2017.  Pain markedly increased in intensity and imaging was performed on 09/11/2017 which revealed: Debris within the anterior abdominal wall incision concerning for infection versus packing material, s/p post ileo-colectomy with expected postoperative changes, mild colonic ileus, numerous pulmonary nodules highly concerning for metastatic disease, punctate nonobstructing nephrolithiasis.  Staples were removed and one was packed.  She was started on doxycycline.  Abdominal and pelvic CT without contrast on 09/11/2017 revealed debris within anterior abdominal wall incision concerning for infection, versus packing material.She is s/pileocolectomy with expected postoperative changes and mild colonic ileus. There were numerous pulmonary nodules highly concerning for metastatic diseaseand punctate nonobstructing nephrolithiasis. She was readmitted on 09/12/2017. She describes the onset of lower abdominal pain on 09/09/2017. Pain markedly increased in intensity on 09/11/2017.  Staples were removed and the wound packed. She was started on doxycycline.  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  PET scan was ordered to evaluate enlarging lung nodules with concern for recurrent cervical cancer but scan was delayed due to insurance and need to be performed in [place].  Presented to ER on 12/03/2017 for  abdominal pain and emesis.  Imaging concerning for worsening possible uterine infarct and she was admitted to hospital.  Pelvic MRI was unremarkable.  Remote scarring type changes of uterus thought to be possibly related to radiation.  She was discharged on 12/08/2017.  Underwent endoscopy and colonoscopy on 12/20/2017.    On 02/22/2018 she underwent laser ablation of condylomata around the anus and vagina under anesthesia with Dr. Johney Maine.   04/15/2018: Chest, abdomen, and pelvis CTrevealed innumerable (>100) cavitary nodules scattered in the lungs, moderately enlarging compared to the 11/08/2017 PET-CT, suspicious for metastatic disease. One index node in the RLL measures 1.0 x 1.1 cm (previously 0.6 x 0.6 cm). There were no new nodules. There was an ill-defined wall thickening in the rectosigmoid with surrounding stranding along fascia planes, probably sequela from prior radiation therapy. There was multilevel lumbar impingement due to spondylosis and degenerative disc disease. There was heterogeneous enhancement in the uterus (some possibly from prior radiation therapy).  04/23/2018:PET scan revealednumerous scattered solid and cavitary nodules in the lungs stable increased in size compared to the prior PET-CT from 11/08/2017.Largest nodule was 1.1 cm in the LUL (SUV 1.9).These demonstratedlow-grade metabolic activity up to a maximum SUV of about 2.3, increased from 11/08/2017.   Case was discussed at tumor board  on 04/25/2018. Consensus to pursue CT-guided biopsy (05/06/18) which revealed: Metastatic adenocarcinoma, morphologically consistent with cervical adenocarcinoma.  She has history of chronic hepatitis C which is managed by GI.  Hepatitis C genotype is 2a/2c.  She receives B12 injections for history of B12 deficiency.  On 04/24/2018 she underwent left total knee replacement with Dr. [name]. --------------------------------------------------------   DIAGNOSIS: Cervical  adenocarcinoma  STAGE: IV        ;GOALS: Palliative  CURRENT/MOST RECENT THERAPY : Taxol single agent      Malignant neoplasm of overlapping sites of cervix ([place])   01/29/2019 -  Chemotherapy    The patient had PACLitaxel (TAXOL) 156 mg in sodium chloride 0.9 % 250 mL chemo infusion (</= 31m/m2), 80 mg/m2 = 156 mg, Intravenous,  Once, 2 of 4 cycles Dose modification: 65 mg/m2 (original dose 80 mg/m2, Cycle 1, Reason: Provider Judgment) Administration: 156 mg (01/29/2019), 156 mg (02/05/2019), 126 mg (02/12/2019), 126 mg (02/26/2019), 126 mg (03/13/2019)  for chemotherapy treatment.      ALLERGIES:  is allergic to ketamine.  MEDICATIONS:  Current Outpatient Medications  Medication Sig Dispense Refill   amitriptyline (ELAVIL) 75 MG tablet Take 1 tablet (75 mg total) by mouth at bedtime. 90 tablet 1   budesonide (ENTOCORT EC) 3 MG 24 hr capsule Take 3 capsules (9 mg total) by mouth daily for 30 days. 90 capsule 0   Calcium Carb-Cholecalciferol (CALCIUM 500 +D) 500-400 MG-UNIT TABS Take 2 tablets by mouth daily.     diphenoxylate-atropine (LOMOTIL) 2.5-0.025 MG tablet TAKE 2 TABLETS BY MOUTH FOUR TIMES A DAY 240 tablet 0   famotidine (PEPCID) 20 MG tablet Take 1 tablet (20 mg total) by mouth 2 (two) times daily as needed for heartburn or indigestion.     hyoscyamine (LEVBID) 0.375 MG 12 hr tablet Take 1 tablet (0.375 mg total)by mouth 2 (two) times daily. 60 tablet 0   meclizine (ANTIVERT) 25 MG tablet Take 1 tablet (25 mg total) by mouth 2 (two) times daily as needed for dizziness. 15 tablet 0   Multiple Vitamins-Minerals (MULTIVITAMIN WITH MINERALS) tablet Take 1 tablet by mouth daily. 30 tablet 0   ondansetron (ZOFRAN ODT) 4 MG disintegrating tablet Take 1 tablet (4 mg total) by mouth every 8 (eight) hours as needed for nausea or vomiting. 20 tablet 0   oxyCODONE (OXYCONTIN) 15 mg 12 hr tablet Take 1 tablet (15 mg total) by mouth every 12 (twelve) hours. 30 tablet 0   Oxycodone  HCl 10 MG TABS Take 1 tablet (10 mg total) by mouth every 6 (six) hours as needed (for breakthrough pain). 90 tablet 0   pantoprazole (PROTONIX) 40 MG tablet Take 1 tablet (40 mg total) by mouth daily. 90 tablet 1   potassium chloride SA (K-DUR,KLOR-CON) 20 MEQ tablet Take 1 tablet (20 mEq total) by mouth daily. 30 tablet 3   promethazine (PHENERGAN) 25 MG tablet Take 1 tablet (25 mg total) by mouth every 8 (eight) hours as needed for nausea or vomiting. 30 tablet 3   rivaroxaban (XARELTO) 20 MG TABS tablet Take 1 tablet (20 mg total) by mouth daily with supper. 30 tablet 1   VENTOLIN HFA 108 (90 Base) MCG/ACT inhaler Inhale 1-2 puffs into the lungs every 4 (four) hours as needed for shortness of breath. 1 Inhaler 1   VIBERZI 100 MG TABS TAKE 1 TABLET BY MOUTH TWICE DAILY WITH A MEAL 60 tablet 0   No current facility-administered medications for this visit.    Facility-Administered Medications  Ordered in Other Visits  Medication Dose Route Frequency Provider Last Rate Last Dose   heparin lock flush 100 unit/mL  500 Units Intravenous Once Mike Gip, Melissa C, MD       heparin lock flush 100 unit/mL  500 Units Intravenous Once Charlaine Dalton R, MD       sodium chloride 0.9 % 1,000 mL with potassium chloride 20 mEq infusion   Intravenous Once Honor Loh E, NP       sodium chloride flush (NS) 0.9 % injection 10 mL  10 mL Intravenous Once Borders, Vonna Kotyk R, NP       sodium chloride flush (NS) 0.9 % injection 10 mL  10 mL Intravenous PRN Charlaine Dalton R, MD   10 mL at 03/20/19 0845    VITAL SIGNS: 5'9", weight 170lb, BMI 25.09, 113/77, HR 80, RR 20  There were no vitals taken for this visit. There were no vitals filed for this visit.  Estimated body mass index is 25.1 kg/m as calculated from the following:   Height as of an earlier encounter on 03/20/19: 5' 9"  (1.753 m).   Weight as of an earlier encounter on 03/20/19: 170 lb (77.1 kg).   LABS: CBC:    Component Value  Date/Time   WBC 2.6 (L) 03/20/2019 0841   HGB 9.6 (L) 03/20/2019 0841   HCT 28.9 (L) 03/20/2019 0841   PLT 136 (L) 03/20/2019 0841   MCV 106.3 (H) 03/20/2019 0841   NEUTROABS 1.8 03/20/2019 0841   LYMPHSABS 0.7 03/20/2019 0841   MONOABS 0.2 03/20/2019 0841   EOSABS 0.0 03/20/2019 0841   BASOSABS 0.0 03/20/2019 0841   Comprehensive Metabolic Panel:    Component Value Date/Time   NA 139 03/20/2019 0841   K 3.5 03/20/2019 0841   CL 106 03/20/2019 0841   CO2 26 03/20/2019 0841   BUN 14 03/20/2019 0841   CREATININE 0.83 03/20/2019 0841   GLUCOSE 124 (H) 03/20/2019 0841   CALCIUM 9.1 03/20/2019 0841   AST 29 03/20/2019 0841   ALT 35 03/20/2019 0841   ALKPHOS 132 (H) 03/20/2019 0841   BILITOT 0.4 03/20/2019 0841   PROT 7.1 03/20/2019 0841   ALBUMIN 3.6 03/20/2019 0841    RADIOGRAPHIC STUDIES: Dg Abd 1 View  Result Date: 03/12/2019 CLINICAL DATA:  Right flank pain EXAM: ABDOMEN - 1 VIEW COMPARISON:  CT abdomen pelvis 01/23/2019 FINDINGS: Lumbar scoliosis and multilevel disc degeneration. No acute skeletal abnormality. Surgical clips in the gallbladder fossa. Surgical staples in the right abdomen. Normal bowel gas pattern.Negative for urinary tract calculi. IMPRESSION: No acute abnormality. Lumbar spine scoliosis and degenerative change. Electronically Signed   By: Franchot Gallo M.D.   On: 03/12/2019 13:38    PERFORMANCE STATUS (ECOG) : 1  REVIEW OF SYSTEMS: Review of Systems  Constitutional: Negative for chills, fever, malaise/fatigue and weight loss.  HENT: Negative for congestion, ear discharge, ear pain, sinus pain, sore throat and tinnitus.   Eyes: Negative.   Respiratory: Negative.  Negative for cough, sputum production and shortness of breath.   Cardiovascular: Negative for chest pain, palpitations, orthopnea, claudication and leg swelling.  Gastrointestinal: Positive for diarrhea (chronic & unchanged). Negative for abdominal pain, blood in stool, constipation, heartburn,  nausea and vomiting.  Genitourinary: Negative.   Musculoskeletal: Negative.   Skin: Negative.   Neurological: Negative for dizziness, tingling, weakness and headaches.  Endo/Heme/Allergies: Negative.   Psychiatric/Behavioral: Negative.     PHYSICAL EXAM: GENERAL: Frail-appearing, thin OROPHARYNX: no thrush or ulceration LUNGS: Unlabored.  No  acute distress HEART/CVS: regular rate & rhythm and no murmurs; No lower extremity edema ABDOMEN: abdomen soft, non-tender Musculoskeletal: Ambulates without aids PSYCH: alert & oriented x 3 with fluent speech NEURO: Nonfocal SKIN: no rashes or significant lesions   IMPRESSION: Met with patient today in clinic.  She has seen Dr. Rogue Bussing in consideration of chemotherapy today.  She reports that she feels well and at baseline and denies specific complaints.  She says pain is well controlled.  She continues to have intermittent diarrhea with somewhat control with as needed medications.  She feels symptoms may be exacerbated by food choices particularly high fat meals.  She recently traveled to [place] with friend.  Says she is active but continues to have some chronic fatigue.  No fever or chills.  No cough.  Some sinus drainage but she recently stopped her Flonase and considers restarting it.  She reports pain and appetite is stable.  Requests refill of oxycodone today.  Case and plan discussed with Dr. Rogue Bussing.   Plan -Continue current scope of treatment -Refill oxycodone 10 mg every 6 hours as needed for breakthrough pain -Continue OxyContin 15 mg every 12 hours for long-acting pain -Continue antidiarrheals per GI -We will refill Protonix and Zofran today -Plan to discuss most and CODE STATUS at next visit - rtc in 2 weeks for follow-up with palliative care  Patient expressed understanding and was in agreement with this plan. She also understands that She can call clinic at any time with any questions, concerns, or complaints.    Time Total: 10 minutes  Visit consisted of counseling and education dealing with the complex and emotionally intense issues of symptom management and palliative care in the setting of serious and potentially life-threatening illness.Greater than 50%  of this time was spent counseling and coordinating care related to the above assessment and plan.  Signed by: Beckey Rutter, DNP, AGNP-C [facility] (work cell) [phone] (office)  CC: Dr. Rogue Bussing

## 2019-03-20 NOTE — Assessment & Plan Note (Addendum)
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  CT abdomen pelvis-April 2020 -no evidence of  progressive disease in the abdomen pelvis stable bowel thickening; lung nodule -clinically stable.  Currently on single agent Taxol 3 w-On and 1 week Off.  #Proceed with Taxol today. Labs today reviewed;  acceptable for treatment today.  #Peripheral neuropathy grade 1-monitor for now.  If worse recommend discontinuation of Taxol/start Keytruda.  #Hypomagnesemia-secondary platinum chemotherapy-mag 1.6 proceed with 4 g magnesium today.  Stable  #Chronic right lower quadrant abdominal pain/scar tissue versus others-narcotic pain medication stable.  # recurrent bowel obstruction/chornic diarrhea on rifaximin/viberzi improved.  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  # DISPOSITION:  # 1 week- labs- bmp/mag-Taxol--Mag 4 gm/2h possible IVFs over 1 hour-  # follow up in 2 weeks-MD- /cbc/cmp/mag; Mag 4 ggm/2 hour- Dr.B

## 2019-03-20 NOTE — Progress Notes (Signed)
[facility] NOTE  Patient Care Team: Lada, Satira Anis, MD as PCP - General (Family Medicine) Mellody Drown, MD as Consulting Physician (Obstetrics and Gynecology) Lenor Coffin, MD as Attending Physician (Gynecology) Lequita Asal, MD as Medical Oncologist (Hematology and Oncology) [name], MD as Consulting Physician (Gastroenterology) Michael Boston, MD as Consulting Physician (General Surgery) Lovell Sheehan, MD as Consulting Physician (Orthopedic Surgery) Clent Jacks, RN as Registered Nurse  CHIEF COMPLAINTS/PURPOSE OF CONSULTATION: Cervical cancer  #  Oncology History   # dec 2017- CERVICAL ADENO CA [[place]]; Stage IB [Dr. Christene Slates at [facility] in [place], [place];No surgery- Chemo-RT;  # 2018-sep - lung nodules [in [place]]  # AUG 20th, 2019-#1 Carbo-Taxol s/p 6 cycles- [No avastin sec to blood clots]: Feb 2020-bilateral lung nodules with 1 cm in size. March 2020- finished carbo-taxol #7; poor tolerance to [place].  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  She received treatment from 11/2016-05/2017.  01/2017 cisplatin x 2 and carboplatin x 1 (01/29/2017) due to ARF and XRT.  XRT was followed by T & O on 02/01/2017 and T & N 02/10/2017 and 02/20/2017.  Course was complicated by 80 pound weight loss, nausea, vomiting, electrolyte wasting (potassium and magnesium).  She describes that.  Is been sick constantly requiring at least 20 hospitalizations.  Follow-up CT chest and PET on 06/2017.  Per patient, 'radiation worked' and no disease in the abdomen.  At that time she was noted to have lung nodules that were growing and follow-up imaging was scheduled for 10/2017.  She was admitted to hospital in [place] for small bowel obstruction which was managed conservatively and she was home for a week prior to traveling to [place] for Thanksgiving holiday where she has family.  She presented to ER in [place] on 08/2017 with nausea, vomiting, and lower abdominal pain.  Symptoms did not respond to conservative treatment.  CT on 08/26/2017 revealed small bowel obstruction with transition in the pelvis just superior to the uterus rather was a long segment of distal ileum with fatty wall thickening compatible with chronic inflammation and/or radiation enteritis. Imaging showed numerous pulmonary nodules consistent with metastatic disease. She underwent laparotomy and right ileocolectomy on 08/31/2017 at [facility].  Surgical findings revealed a thickened, matted, and scarred piece of distal small bowel close to the ileocecal valve.  She was discharged on 09/05/2017.  Pain markedly increased in intensity and imaging was performed on 09/11/2017 which revealed: Debris within the anterior abdominal wall incision concerning for infection versus packing material, s/p post ileo-colectomy with expected postoperative changes, mild colonic ileus, numerous pulmonary nodules highly concerning for metastatic disease, punctate nonobstructing nephrolithiasis.  Staples were removed and one was packed.  She was started on doxycycline.  Abdominal and pelvic CT without contrast on 09/11/2017 revealed debris within anterior abdominal wall incision concerning for infection, versus packing material.She is s/pileocolectomy with expected postoperative changes and mild colonic ileus. There were numerous pulmonary nodules highly concerning for metastatic diseaseand punctate nonobstructing nephrolithiasis. She  was readmitted on 09/12/2017. She describes the onset of lower abdominal  pain on 09/09/2017. Pain markedly increased in intensity on 09/11/2017.  Staples were removed and the wound packed. She was started on doxycycline.  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  PET scan was ordered to evaluate enlarging lung nodules with concern for recurrent cervical cancer but scan was delayed due to insurance and need to be performed in [place].  Presented to ER on 12/03/2017 for abdominal pain and emesis.  Imaging concerning for worsening possible uterine infarct and she was admitted to hospital.  Pelvic MRI was unremarkable.  Remote scarring type changes of uterus thought to be possibly related to radiation.  She was discharged on 12/08/2017.  Underwent endoscopy and colonoscopy on 12/20/2017.    On 02/22/2018 she underwent laser ablation of condylomata around the anus and vagina under anesthesia with Dr. Johney Maine.   04/15/2018: Chest, abdomen, and pelvis CTrevealed innumerable (>100) cavitary nodules scattered in the lungs, moderately enlarging compared to the 11/08/2017 PET-CT, suspicious for metastatic disease. One index node in the RLL measures 1.0 x 1.1 cm (previously 0.6 x 0.6 cm). There were no new nodules. There was an ill-defined wall thickening in the rectosigmoid with surrounding stranding along fascia planes, probably sequela from prior radiation therapy. There was multilevel lumbar impingement due to spondylosis and degenerative disc disease. There was heterogeneous enhancement in the uterus (some possibly from  prior radiation therapy).  04/23/2018:PET scan revealednumerous scattered solid and cavitary nodules in the lungs stable increased in size compared to the prior PET-CT from 11/08/2017.Largest nodule was 1.1 cm in the LUL (SUV 1.9).These demonstratedlow-grade metabolic activity up to a maximum SUV of about 2.3, increased from 11/08/2017.   Case was discussed at tumor board on 04/25/2018. Consensus to pursue CT-guided biopsy (05/06/18) which revealed: Metastatic adenocarcinoma, morphologically consistent with cervical adenocarcinoma.  She has history of chronic hepatitis C which is managed by GI.  Hepatitis C genotype is 2a/2c.  She receives B12 injections for history of B12 deficiency.  On 04/24/2018 she underwent left total knee replacement with Dr. [name]. --------------------------------------------------------   DIAGNOSIS: Cervical adenocarcinoma  STAGE: IV        ;GOALS: Palliative  CURRENT/MOST RECENT THERAPY : Taxol single agent      Malignant neoplasm of overlapping sites of cervix ([place])   01/29/2019 -  Chemotherapy    The patient had PACLitaxel (TAXOL) 156 mg in sodium chloride 0.9 % 250 mL chemo infusion (</= 86m/m2), 80 mg/m2 = 156 mg, Intravenous,  Once, 2 of 4 cycles Dose modification: 65 mg/m2 (original dose 80 mg/m2, Cycle 1, Reason: Provider Judgment) Administration: 156 mg (01/29/2019), 156 mg (02/05/2019), 126 mg (02/12/2019), 126 mg (02/26/2019), 126 mg (03/13/2019), 126 mg (03/20/2019)  for chemotherapy treatment.       HISTORY OF PRESENTING ILLNESS:  Joanna [id number].[id number].  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  Patient denies any significant vaginal bleeding or rectal bleeding at this time.  States abdominal pain is fairly well controlled on oxycodone.  Admits to mild tingling and numbness in the hands.  This is not interrupting her daily life.  She denies any tingling  or numbness in  extremities.  Chronic joint pains.  Not any worse.  Review of Systems  Constitutional: Positive for malaise/fatigue. Negative for chills, diaphoresis, fever and weight loss.  HENT: Negative for nosebleeds and sore throat.   Eyes: Negative for double vision.  Respiratory: Negative for cough, hemoptysis, sputum production, shortness of breath and wheezing.   Cardiovascular: Negative for chest pain, palpitations, orthopnea and leg swelling.  Gastrointestinal: Positive for abdominal pain. Negative for blood in stool, constipation, diarrhea, heartburn, melena, nausea and vomiting.  Genitourinary: Negative for dysuria, frequency and urgency.  Musculoskeletal: Negative for back pain and joint pain.  Skin: Negative.  Negative for itching and rash.  Neurological: Negative for dizziness, tingling, focal weakness, weakness and headaches.  Endo/Heme/Allergies: Does not bruise/bleed easily.  Psychiatric/Behavioral: Negative for depression. The patient is not nervous/anxious and does not have insomnia.      MEDICAL HISTORY:  Past Medical History:  Diagnosis Date  . Abdominal pain 06/10/2018  . Abnormal cervical Papanicolaou smear 09/18/2017  . Anxiety   . Aortic atherosclerosis ([place])   . Arthritis    neck and knees  . 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 Blood transfusion without reported diagnosis   . Cervical cancer ([place]) 09/2016   mets lung  . Chronic anal fissure   . Chronic diarrhea   . Dyspnea   . Erosive gastropathy 09/18/2017  . Factor V Leiden mutation ([place])   . Fecal incontinence   . Genital warts   . GERD (gastroesophageal reflux disease)   . Heart murmur   . Hematochezia   . Hemorrhoids   . 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 Hepatitis, chronic ([place]) 05/05/2017  . History of cancer chemotherapy    completed 06/2017  . History of Clostridium difficile infection    while undergoing chemo.  Negative test 10/2017  . Ileocolic  anastomotic leak   . 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 Intestinal infection due to Clostridium difficile 09/18/2017  . Macrocytic anemia with vitamin B12 deficiency   . Multiple gastric ulcers   . Nausea vomiting and diarrhea   . Pancolitis ([place]) 07/27/2018  . Perianal condylomata   . Pneumonia    History of  . Pulmonary nodules   . Rectal bleeding   . Small bowel obstruction ([place]) 08/2017  . Stiff neck    limited right turn  . Vitamin D deficiency     SURGICAL HISTORY: Past Surgical History:  Procedure Laterality Date  . CHOLECYSTECTOMY    . COLON SURGERY  08/2017   resection  . COLONOSCOPY WITH PROPOFOL N/A 12/20/2017   Procedure: COLONOSCOPY WITH PROPOFOL;  Surgeon: [name], MD;  Location: [facility] ENDOSCOPY;  Service: Gastroenterology;  Laterality: N/A;  . COLONOSCOPY WITH PROPOFOL N/A 07/30/2018   Procedure: COLONOSCOPY WITH PROPOFOL;  Surgeon: [name], MD;  Location: [facility] ENDOSCOPY;  Service: Gastroenterology;  Laterality: N/A;  . COLONOSCOPY WITH PROPOFOL N/A 10/10/2018   Procedure: COLONOSCOPY WITH PROPOFOL;  Surgeon: Lucilla Lame, MD;  Location: [facility] ENDOSCOPY;  Service: Endoscopy;  Laterality: N/A;  . DIAGNOSTIC LAPAROSCOPY    . ESOPHAGOGASTRODUODENOSCOPY (EGD) WITH PROPOFOL N/A 12/20/2017   Procedure: ESOPHAGOGASTRODUODENOSCOPY (EGD) WITH PROPOFOL;  Surgeon: [name], MD;  Location: [place];  Service: Gastroenterology;  Laterality: N/A;  . ESOPHAGOGASTRODUODENOSCOPY (EGD) WITH PROPOFOL  07/30/2018   Procedure: ESOPHAGOGASTRODUODENOSCOPY (EGD) WITH PROPOFOL;  Surgeon: Sherri Sear  Reece Levy, MD;  Location: [facility] ENDOSCOPY;  Service: Gastroenterology;;  . Otho Darner SIGMOIDOSCOPY N/A 11/21/2018   Procedure: FLEXIBLE SIGMOIDOSCOPY;  Surgeon: [name], MD;  Location: [facility] ENDOSCOPY;  Service: Gastroenterology;  Laterality: N/A;  . LAPAROTOMY N/A 08/31/2017   Procedure: EXPLORATORY LAPAROTOMY for SBO, ileocolectomy,  removal of piece of uterine wall;  Surgeon: [name], MD;  Location: [facility] ORS;  Service: General;  Laterality: N/A;  . LASER ABLATION CONDOLAMATA N/A 02/22/2018   Procedure: LASER ABLATION/REMOVAL OF [id number] AROUND ANUS AND VAGINA;  Surgeon: Michael Boston, MD;  Location: [place];  Service: General;  Laterality: N/A;  . OOPHORECTOMY    . PORTA CATH INSERTION N/A 05/13/2018   Procedure: PORTA CATH INSERTION;  Surgeon: Katha Cabal, MD;  Location: [place] CV LAB;  Service: Cardiovascular;  Laterality: N/A;  . SMALL INTESTINE SURGERY    . TANDEM RING INSERTION     x3  . THORACOTOMY    . TOTAL KNEE ARTHROPLASTY Left 04/24/2018   Procedure: TOTAL KNEE ARTHROPLASTY;  Surgeon: Lovell Sheehan, MD;  Location: [facility] ORS;  Service: Orthopedics;  Laterality: Left;    SOCIAL HISTORY: Social History   Socioeconomic History  . Marital status: Divorced    Spouse name: Not on file  . Number of children: Not on file  . Years of education: Not on file  . Highest education level: Not on file  Occupational History  . Not on file  Social Needs  . Financial resource strain: Not hard at all  . Food insecurity:    Worry: Never true    Inability: Never true  . Transportation needs:    Medical: No    Non-medical: No  Tobacco Use  . Smoking status: Former Smoker    Last attempt to quit: 10/16/2006    Years since quitting: 12.4  . Smokeless tobacco: Never Used  Substance and Sexual Activity  . Alcohol use: Not Currently    Frequency: Never    Comment: seldom  . Drug use: Yes    Types: Marijuana  . Sexual activity: Not Currently    Birth control/protection: Post-menopausal    Comment: Not Asked  Lifestyle  . Physical activity:    Days per week: Patient refused    Minutes per session: Patient refused  . Stress: Only a little  Relationships  . Social connections:    Talks on phone: Patient refused    Gets together: Patient refused    Attends religious  service: Patient refused    Active member of club or organization: Patient refused    Attends meetings of clubs or organizations: Patient refused    Relationship status: Patient refused  . Intimate partner violence:    Fear of current or ex partner: No    Emotionally abused: No    Physically abused: No    Forced sexual activity: No  Other Topics Concern  . Not on file  Social History Narrative  . Not on file    FAMILY HISTORY: Family History  Problem Relation Age of Onset  . Hypertension Father   . Diabetes Father   . Alcohol abuse Daughter   . Hypertension Maternal Grandmother   . Diabetes Maternal Grandmother   . Diabetes Paternal Grandmother   . Hypertension Paternal Grandmother     ALLERGIES:  is allergic to ketamine.  MEDICATIONS:  Current Outpatient Medications  Medication Sig Dispense Refill  . amitriptyline (ELAVIL) 75 MG tablet Take 1 tablet (75 mg total) by mouth at bedtime. [place]  tablet 1  . budesonide (ENTOCORT EC) 3 MG 24 hr capsule Take 3 capsules (9 mg total) by mouth daily for 30 days. 90 capsule 0  . Calcium Carb-Cholecalciferol (CALCIUM 500 +D) 500-400 MG-UNIT TABS Take 2 tablets by mouth daily.    . diphenoxylate-atropine (LOMOTIL) 2.5-0.025 MG tablet TAKE 2 TABLETS BY MOUTH FOUR TIMES A DAY 240 tablet 0  . famotidine (PEPCID) 20 MG tablet Take 1 tablet (20 mg total) by mouth 2 (two) times daily as needed for heartburn or indigestion.    . hyoscyamine (LEVBID) 0.375 MG 12 hr tablet Take 1 tablet (0.375 mg total)by mouth 2 (two) times daily. 60 tablet 0  . meclizine (ANTIVERT) 25 MG tablet Take 1 tablet (25 mg total) by mouth 2 (two) times daily as needed for dizziness. 15 tablet 0  . Multiple Vitamins-Minerals (MULTIVITAMIN WITH MINERALS) tablet Take 1 tablet by mouth daily. 30 tablet 0  . ondansetron (ZOFRAN ODT) 4 MG disintegrating tablet Take 1 tablet (4 mg total) by mouth every 8 (eight) hours as needed for nausea or vomiting. 30 tablet 2  . oxyCODONE  (OXYCONTIN) 15 mg 12 hr tablet Take 1 tablet (15 mg total) by mouth every 12 (twelve) hours. 30 tablet 0  . Oxycodone HCl 10 MG TABS Take 1 tablet (10 mg total) by mouth every 6 (six) hours as needed (for breakthrough pain). 90 tablet 0  . pantoprazole (PROTONIX) 40 MG tablet Take 1 tablet (40 mg total) by mouth daily. 90 tablet 1  . potassium chloride SA (K-DUR,KLOR-CON) 20 MEQ tablet Take 1 tablet (20 mEq total) by mouth daily. 30 tablet 3  . promethazine (PHENERGAN) 25 MG tablet Take 1 tablet (25 mg total) by mouth every 8 (eight) hours as needed for nausea or vomiting. 30 tablet 3  . rivaroxaban (XARELTO) 20 MG TABS tablet Take 1 tablet (20 mg total) by mouth daily with supper. 30 tablet 1  . VENTOLIN HFA 108 (90 Base) MCG/ACT inhaler Inhale 1-2 puffs into the lungs every 4 (four) hours as needed for shortness of breath. 1 Inhaler 1  . VIBERZI 100 MG TABS TAKE 1 TABLET BY MOUTH TWICE DAILY WITH A MEAL 60 tablet 0   No current facility-administered medications for this visit.    Facility-Administered Medications Ordered in Other Visits  Medication Dose Route Frequency Provider Last Rate Last Dose  . heparin lock flush 100 unit/mL  500 Units Intravenous Once Corcoran, Melissa C, MD      . heparin lock flush 100 unit/mL  500 Units Intracatheter Once PRN Charlaine Dalton R, MD      . sodium chloride 0.9 % 1,000 mL with potassium chloride 20 mEq infusion   Intravenous Once Honor Loh E, NP      . sodium chloride flush (NS) 0.9 % injection 10 mL  10 mL Intravenous Once Borders, Kirt Boys, NP          .  PHYSICAL EXAMINATION: ECOG PERFORMANCE STATUS: 1 - Symptomatic but completely ambulatory  Vitals:   03/20/19 0845 03/20/19 0857  BP:  113/77  Pulse:  80  Resp: 20   Temp: (!) 97.5 F (36.4 C)    Filed Weights   03/20/19 0857  Weight: 170 lb (77.1 kg)    Physical Exam  Constitutional: She is oriented to person, place, and time and well-developed, well-nourished, and in no  distress.  Alone.  Walking herself.   HENT:  Head: Normocephalic and atraumatic.  Mouth/Throat: Oropharynx is clear and moist. No oropharyngeal  exudate.  Eyes: Pupils are equal, round, and reactive to light.  Neck: Normal range of motion. Neck supple.  Cardiovascular: Normal rate and regular rhythm.  Pulmonary/Chest: Effort normal and breath sounds normal. No respiratory distress. She has no wheezes.  Abdominal: Soft. Bowel sounds are normal. She exhibits no distension and no mass. There is no rebound and no guarding.  Musculoskeletal: Normal range of motion.        General: No tenderness or edema.  Neurological: She is alert and oriented to person, place, and time.  Skin: Skin is warm.  Psychiatric: Affect normal.     LABORATORY DATA:  I have reviewed the data as listed Lab Results  Component Value Date   WBC 2.6 (L) 03/20/2019   HGB 9.6 (L) 03/20/2019   HCT 28.9 (L) 03/20/2019   MCV 106.3 (H) 03/20/2019   PLT 136 (L) 03/20/2019   Recent Labs    06/11/18 0510  07/08/18 1035  10/21/18 0845  03/05/19 0850 03/10/19 2140 03/13/19 0803 03/20/19 0841  NA 135   < > 136   < > 134*   < > 138 138 136 139  K 3.9   < > 4.2   < > 3.9   < > 3.4* 3.6 3.4* 3.5  CL 102   < > 107   < > 98   < > 104 105 104 106  CO2 27   < > 22   < > 24   < > 23 23 25 26   GLUCOSE 82   < > 119*   < > 111*   < > 109* 113* 99 124*  BUN 11   < > 15   < > 17   < > 14 14 10 14   CREATININE 0.63   < > 0.71   < > 0.91   < > 0.77 0.77 0.82 0.83  CALCIUM 8.9   < > 8.7*   < > 9.6   < > 9.0 8.5* 9.1 9.1  GFRNONAA >60   < > >60   < > >60   < > >60 >60 >60 >60  GFRAA >60   < > >60   < > >60   < > >60 >60 >60 >60  PROT 7.0   < > 6.5   < > 8.6*   < > 7.3  --  7.6 7.1  ALBUMIN 3.5   < > 3.4*   < > 4.4   < > 3.9  --  3.9 3.6  AST 41   < > 26   < > 48*   < > 33  --  37 29  ALT 51*   < > 33   < > 67*   < > 60*  --  56* 35  ALKPHOS 171*   < > 132*   < > 185*   < > 135*  --  140* 132*  BILITOT 0.7   < > 0.4   < > 1.4*   <  > 0.5  --  0.5 0.4  BILIDIR 0.1  --  <0.1  --  0.3*  --   --   --   --   --   IBILI 0.6  --  NOT CALCULATED  --   --   --   --   --   --   --    < > = values in this interval not displayed.    RADIOGRAPHIC STUDIES: I have personally reviewed the  radiological images as listed and agreed with the findings in the report. Dg Abd 1 View  Result Date: 03/12/2019 CLINICAL DATA:  Right flank pain EXAM: ABDOMEN - 1 VIEW COMPARISON:  CT abdomen pelvis 01/23/2019 FINDINGS: Lumbar scoliosis and multilevel disc degeneration. No acute skeletal abnormality. Surgical clips in the gallbladder fossa. Surgical staples in the right abdomen. Normal bowel gas pattern.Negative for urinary tract calculi. IMPRESSION: No acute abnormality. Lumbar spine scoliosis and degenerative change. Electronically Signed   By: Franchot Gallo M.D.   On: 03/12/2019 13:38    ASSESSMENT & PLAN:   Malignant neoplasm of overlapping sites of cervix [facility]) #Cervical adenocarcinoma with metastasis to the lung-currently on carbotaxol.  CT abdomen pelvis-April 2020 -no evidence of  progressive disease in the abdomen pelvis stable bowel thickening; lung nodule -clinically stable.  Currently on single agent taxol.  # proceed with single agent Taxol 3 w-On and 1 week Off. ; Labs today reviewed;  acceptable for treatment today- except ANC- 1.1/ and platelets-134.   #Discussed with the patient that bone marrow especially white count is very suboptimal-which is secondary to onset of chemotherapy.  Blood counts continue to be an issue-the next reasonable option would be Keytruda.  Discussed potential mechanism of action/side effects.  Taxol at this time-however continued issues with count recovery Keytruda would be recommended next.  #Hypomagnesemia-secondary platinum chemotherapy-mag 1.3 proceed with 4 g magnesium today. Stable.   #Chronic right lower quadrant abdominal pain/scar tissue versus others- stable.   # recurrent bowel  obstruction/chornic diarrhea on rifaximin/viberzi-blood in stools resolved.   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   # adrenal insuffiency- on maintenance steroids-Stable.  Recommend to keep appointment with her  # DISPOSITION:  # Taxol/ Mag 4 gm/ 2h today. # 1 week- Dr.Yu- bmp/mag-Taxol--Mag 4 gm/2h possible IVFs over 1 hour-Dr.B     All questions were answered. The patient knows to call the clinic with any problems, questions or concerns.     Cammie Sickle, MD 03/20/2019 5:27 PM

## 2019-03-20 NOTE — Telephone Encounter (Signed)
Pt is calling back with rx name  She states it was not the lamodil  Th e rx she needs is RX budesonide  3 mg  Capsule

## 2019-03-20 NOTE — Telephone Encounter (Signed)
Pt is calling she needs rx starts with a B she states she doesn't know how to spell it  she thinks its rx Lamodil but she needs Generic  [facility] please call pt

## 2019-03-21 ENCOUNTER — Other Ambulatory Visit: Payer: Self-pay | Admitting: Gastroenterology

## 2019-03-21 ENCOUNTER — Other Ambulatory Visit: Payer: Self-pay | Admitting: *Deleted

## 2019-03-21 DIAGNOSIS — K529 Noninfective gastroenteritis and colitis, unspecified: Secondary | ICD-10-CM

## 2019-03-21 DIAGNOSIS — C530 Malignant neoplasm of endocervix: Secondary | ICD-10-CM

## 2019-03-21 MED ORDER — BUDESONIDE 3 MG PO CPEP: 9.0000 mg | ORAL_CAPSULE | Freq: Every day | ORAL | 2 refills | Status: AC

## 2019-03-24 ENCOUNTER — Other Ambulatory Visit: Payer: Self-pay

## 2019-03-24 ENCOUNTER — Inpatient Hospital Stay (HOSPITAL_BASED_OUTPATIENT_CLINIC_OR_DEPARTMENT_OTHER): Payer: Medicaid Other | Admitting: Oncology

## 2019-03-24 ENCOUNTER — Other Ambulatory Visit: Payer: Self-pay | Admitting: *Deleted

## 2019-03-24 ENCOUNTER — Telehealth: Payer: Self-pay | Admitting: *Deleted

## 2019-03-24 ENCOUNTER — Ambulatory Visit: Payer: Medicaid Other

## 2019-03-24 ENCOUNTER — Inpatient Hospital Stay: Payer: Medicaid Other

## 2019-03-24 DIAGNOSIS — R110 Nausea: Secondary | ICD-10-CM

## 2019-03-24 DIAGNOSIS — R197 Diarrhea, unspecified: Secondary | ICD-10-CM | POA: Diagnosis not present

## 2019-03-24 DIAGNOSIS — C530 Malignant neoplasm of endocervix: Secondary | ICD-10-CM

## 2019-03-24 DIAGNOSIS — Z5111 Encounter for antineoplastic chemotherapy: Secondary | ICD-10-CM | POA: Diagnosis not present

## 2019-03-24 DIAGNOSIS — R531 Weakness: Secondary | ICD-10-CM

## 2019-03-24 DIAGNOSIS — R112 Nausea with vomiting, unspecified: Secondary | ICD-10-CM

## 2019-03-24 DIAGNOSIS — E860 Dehydration: Secondary | ICD-10-CM

## 2019-03-24 LAB — COMPREHENSIVE METABOLIC PANEL
ALT: 56 U/L — ABNORMAL HIGH (ref 0–44)
AST: 40 U/L (ref 15–41)
Albumin: 3.7 g/dL (ref 3.5–5.0)
Alkaline Phosphatase: 150 U/L — ABNORMAL HIGH (ref 38–126)
Anion gap: 8 (ref 5–15)
BUN: 13 mg/dL (ref 6–20)
CO2: 26 mmol/L (ref 22–32)
Calcium: 8.9 mg/dL (ref 8.9–10.3)
Chloride: 103 mmol/L (ref 98–111)
Creatinine, Ser: 0.72 mg/dL (ref 0.44–1.00)
GFR calc Af Amer: >60 mL/min (ref >60)
GFR calc non Af Amer: >60 mL/min (ref >60)
Glucose, Bld: 102 mg/dL — ABNORMAL HIGH (ref 70–99)
Potassium: 3.8 mmol/L (ref 3.5–5.1)
Sodium: 137 mmol/L (ref 135–145)
Total Bilirubin: 0.8 mg/dL (ref 0.3–1.2)
Total Protein: 7.3 g/dL (ref 6.5–8.1)

## 2019-03-24 LAB — CBC WITH DIFFERENTIAL/PLATELET
Abs Immature Granulocytes: 0.01 10*3/uL (ref 0.00–0.07)
Basophils Absolute: 0 10*3/uL (ref 0.0–0.1)
Basophils Relative: 0 %
Eosinophils Absolute: 0 10*3/uL (ref 0.0–0.5)
Eosinophils Relative: 0 %
HCT: 30.3 % — ABNORMAL LOW (ref 36.0–46.0)
Hemoglobin: 9.9 g/dL — ABNORMAL LOW (ref 12.0–15.0)
Immature Granulocytes: 0 %
Lymphocytes Relative: 25 %
Lymphs Abs: 0.6 10*3/uL — ABNORMAL LOW (ref 0.7–4.0)
MCH: 34.7 pg — ABNORMAL HIGH (ref 26.0–34.0)
MCHC: 32.7 g/dL (ref 30.0–36.0)
MCV: 106.3 fL — ABNORMAL HIGH (ref 80.0–100.0)
Monocytes Absolute: 0.1 10*3/uL (ref 0.1–1.0)
Monocytes Relative: 4 %
Neutro Abs: 1.6 10*3/uL — ABNORMAL LOW (ref 1.7–7.7)
Neutrophils Relative %: 71 %
Platelets: 123 10*3/uL — ABNORMAL LOW (ref 150–400)
RBC: 2.85 MIL/uL — ABNORMAL LOW (ref 3.87–5.11)
RDW: 13.8 % (ref 11.5–15.5)
WBC: 2.3 10*3/uL — ABNORMAL LOW (ref 4.0–10.5)
nRBC: 0 % (ref 0.0–0.2)

## 2019-03-24 LAB — MAGNESIUM: Magnesium: 1.7 mg/dL (ref 1.7–2.4)

## 2019-03-24 MED ORDER — DEXAMETHASONE SODIUM PHOSPHATE 10 MG/ML IJ SOLN
10.0000 mg | Freq: Once | INTRAMUSCULAR | Status: AC
  Administered 2019-03-24: 10 mg via INTRAVENOUS
  Filled 2019-03-24: qty 1

## 2019-03-24 MED ORDER — ONDANSETRON HCL 4 MG/2ML IJ SOLN
8.0000 mg | Freq: Once | INTRAMUSCULAR | Status: AC
  Administered 2019-03-24: 8 mg via INTRAVENOUS
  Filled 2019-03-24: qty 4

## 2019-03-24 MED ORDER — SODIUM CHLORIDE 0.9 % IV SOLN
INTRAVENOUS | Status: DC
  Administered 2019-03-24: 14:00:00 via INTRAVENOUS
  Filled 2019-03-24 (×2): qty 250

## 2019-03-24 MED ORDER — HEPARIN SOD (PORK) LOCK FLUSH 100 UNIT/ML IV SOLN
500.0000 [IU] | Freq: Once | INTRAVENOUS | Status: AC
  Administered 2019-03-24: 500 [IU]
  Filled 2019-03-24: qty 5

## 2019-03-24 MED ORDER — SODIUM CHLORIDE 0.9% FLUSH
10.0000 mL | Freq: Once | INTRAVENOUS | Status: AC
  Administered 2019-03-24: 10 mL via INTRAVENOUS
  Filled 2019-03-24: qty 10

## 2019-03-24 NOTE — Progress Notes (Signed)
Symptom Management Consult note [facility]  [phone] [phone]  Patient Care Team: Arnetha Courser, MD as PCP - General (Family Medicine) Mellody Drown, MD as Consulting Physician (Obstetrics and Gynecology) Lenor Coffin, MD as Attending Physician (Gynecology) Lequita Asal, MD as Medical Oncologist (Hematology and Oncology) [name], MD as Consulting Physician (Gastroenterology) Michael Boston, MD as Consulting Physician (General Surgery) Lovell Sheehan, MD as Consulting Physician (Orthopedic Surgery) Clent Jacks, RN as Registered Nurse   Name of the patient: Joanna Hall  [id number]  1965-12-19   Date of visit: 03/24/2019   Diagnosis- Cervical cancer  Chief complaint/ Reason for visit- weakness/dizziness  Heme/Onc history:  Oncology History   # dec 2017- CERVICAL ADENO CA [[place]]; Stage IB [Dr. Christene Slates at [facility] in [place], [place];No surgery- Chemo-RT;  # 2018-sep - lung nodules [in [place]]  # AUG 20th, 2019-#1 Carbo-Taxol s/p 6 cycles- [No avastin sec to blood clots]: Feb 2020-bilateral lung nodules with 1 cm in size. March 2020- finished carbo-taxol #7; poor tolerance to [place].  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  She received treatment from 11/2016-05/2017.  01/2017 cisplatin x 2 and carboplatin x 1 (01/29/2017) due to ARF and XRT.  XRT was followed by T & O on 02/01/2017 and T & N 02/10/2017 and 02/20/2017.  Course was  complicated by 80 pound weight loss, nausea, vomiting, electrolyte wasting (potassium and magnesium).  She describes that.  Is been sick constantly requiring at least 20 hospitalizations.  Follow-up CT chest and PET on 06/2017. Per patient, 'radiation worked' and no disease in the abdomen.  At that time she was noted to have lung nodules that were growing and follow-up imaging was scheduled for 10/2017.  She was admitted to hospital in [place] for small bowel obstruction which was managed conservatively and she was home for a week prior to traveling to [place] for Thanksgiving holiday where she has family.  She presented to ER in [place] on 08/2017 with nausea, vomiting, and lower abdominal pain.  Symptoms did not respond to conservative treatment.  CT on 08/26/2017 revealed small bowel obstruction with transition in the pelvis just superior to the uterus rather was a long segment of distal ileum with fatty wall thickening compatible with chronic inflammation and/or radiation enteritis. Imaging showed numerous pulmonary nodules consistent with metastatic disease. She underwent laparotomy and right ileocolectomy on 08/31/2017 at [facility].  Surgical findings revealed a thickened, matted, and scarred piece of distal small bowel close to the ileocecal valve.  She was discharged on 09/05/2017.  Pain markedly increased in intensity and imaging was performed on 09/11/2017 which revealed: Debris within the anterior abdominal wall incision concerning for infection versus packing material, s/p post ileo-colectomy with expected postoperative changes, mild colonic ileus, numerous pulmonary nodules highly concerning for metastatic disease, punctate nonobstructing nephrolithiasis.  Staples were removed and one was packed.  She was started on doxycycline.  Abdominal and pelvic CT without contrast on 09/11/2017 revealed debris within anterior abdominal wall incision concerning for infection, versus packing  material.She is  s/pileocolectomy with expected postoperative changes and mild colonic ileus. There were numerous pulmonary nodules highly concerning for metastatic diseaseand punctate nonobstructing nephrolithiasis. She was readmitted on 09/12/2017. She describes the onset of lower abdominal pain on 09/09/2017. Pain markedly increased in intensity on 09/11/2017.  Staples were removed and the wound packed. She was started on doxycycline.  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  PET scan was ordered to evaluate enlarging lung nodules with concern for recurrent cervical cancer but scan was delayed due to insurance and need to be performed in [place].  Presented to ER on 12/03/2017 for abdominal pain and emesis.  Imaging concerning for worsening possible uterine infarct and she was admitted to hospital.  Pelvic MRI was unremarkable.  Remote scarring type changes of uterus thought to be possibly related to radiation.  She was discharged on 12/08/2017.  Underwent endoscopy and colonoscopy on 12/20/2017.    On 02/22/2018 she underwent laser ablation of condylomata around the anus and vagina under anesthesia with Dr. Johney Maine.   04/15/2018: Chest, abdomen, and pelvis CTrevealed innumerable (>100) cavitary nodules scattered in the lungs, moderately enlarging compared to the 11/08/2017 PET-CT, suspicious for metastatic disease. One index node in the RLL measures 1.0 x 1.1 cm (previously 0.6 x 0.6 cm). There were no new nodules. There was an ill-defined wall thickening in the rectosigmoid with surrounding stranding  along fascia planes, probably sequela from prior radiation therapy. There was multilevel lumbar impingement due to spondylosis and degenerative disc disease. There was heterogeneous enhancement in the uterus (some possibly from prior radiation therapy).  04/23/2018:PET scan revealednumerous scattered solid and cavitary nodules in the lungs stable increased in size compared to the prior PET-CT from 11/08/2017.Largest nodule was 1.1 cm in the LUL (SUV 1.9).These demonstratedlow-grade metabolic activity up to a maximum SUV of about 2.3, increased from 11/08/2017.   Case was discussed at tumor board on 04/25/2018. Consensus to pursue CT-guided biopsy (05/06/18) which revealed: Metastatic adenocarcinoma, morphologically consistent with cervical adenocarcinoma.  She has history of chronic hepatitis C which is managed by GI.  Hepatitis C genotype is 2a/2c.  She receives B12 injections for history of B12 deficiency.  On 04/24/2018 she underwent left total knee replacement with Dr. [name]. --------------------------------------------------------   DIAGNOSIS: Cervical adenocarcinoma  STAGE: IV        ;GOALS: Palliative  CURRENT/MOST RECENT THERAPY : Taxol single agent      Malignant neoplasm of overlapping sites of cervix ([place])   01/29/2019 -  Chemotherapy    The patient had PACLitaxel (TAXOL) 156 mg in sodium chloride 0.9 % 250 mL chemo infusion (</= 35m/m2), 80 mg/m2 = 156 mg, Intravenous,  Once, 2 of 4 cycles Dose modification: 65 mg/m2 (original dose 80 mg/m2, Cycle 1, Reason: Provider Judgment) Administration: 156 mg (01/29/2019), 156 mg (02/05/2019), 126 mg (02/12/2019), 126 mg (02/26/2019), 126 mg (03/13/2019), 126 mg (03/20/2019)  for chemotherapy treatment.       Interval history-53year old female with above history of adenocarcinoma of the cervix with metastasis to the lung, currently on weekly Taxol [facility].  Over the past few days  she has been feeling weak  and fatigued.  Normal everyday activities are becoming more challenging given her weakness.  Her last treatment was on 03/20/2019 of single agent Taxol.  Patient states these have been recurrent symptoms after receiving treatment.  She has noticed a slight improvement in her energy since stopping [place] a few weeks back.  Symptoms are not quite as bad as they were with combination carbo/Taxol.  She also complains of diarrhea that is chronic exacerbated by Taxol x3 days.  Having difficulty forcing fluids given she is nauseated but no active vomiting.  She denies any recent fevers, bleeding or bruising and denies intentional weight loss.  She denies chest pain or shortness of breath.  Denies urinary complaints.  ECOG FS:1 - Symptomatic but completely ambulatory  Review of systems- Review of Systems  Constitutional: Positive for malaise/fatigue. Negative for chills, fever and weight loss.  HENT: Negative for congestion, ear pain and tinnitus.   Eyes: Negative.  Negative for blurred vision and double vision.  Respiratory: Negative.  Negative for cough, sputum production and shortness of breath.   Cardiovascular: Negative.  Negative for chest pain, palpitations and leg swelling.  Gastrointestinal: Positive for diarrhea and nausea. Negative for abdominal pain, constipation and vomiting.  Genitourinary: Negative for dysuria, frequency and urgency.  Musculoskeletal: Negative for back pain and falls.  Skin: Negative.  Negative for rash.  Neurological: Positive for dizziness and weakness. Negative for headaches.  Endo/Heme/Allergies: Negative.  Does not bruise/bleed easily.  Psychiatric/Behavioral: Negative.  Negative for depression. The patient is not nervous/anxious and does not have insomnia.       Current treatment-status post 6 cycles single agent taxol  Allergies  Allergen Reactions   Ketamine Anxiety and Other (See Comments)    Syncope episode/confusion      Past  Medical History:  Diagnosis Date   Abdominal pain 06/10/2018   Abnormal cervical Papanicolaou smear 09/18/2017   Anxiety    Aortic atherosclerosis (HCC)    Arthritis    neck and knees   Blood clots in brain    both lungs and right kidney   Blood transfusion without reported diagnosis    Cervical cancer ([place]) 09/2016   mets lung   Chronic anal fissure    Chronic diarrhea    Dyspnea    Erosive gastropathy 09/18/2017   Factor V Leiden mutation ([place])    Fecal incontinence    Genital warts    GERD (gastroesophageal reflux disease)    Heart murmur    Hematochezia    Hemorrhoids    Hepatitis C    Chronic, 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   Intestinal infection due to Clostridium difficile 09/18/2017   Macrocytic anemia with vitamin B12 deficiency    Multiple gastric ulcers    Nausea vomiting and diarrhea    Pancolitis ([place]) 07/27/2018   Perianal condylomata    Pneumonia    History of   Pulmonary nodules    Rectal bleeding    Small bowel obstruction ([place]) 08/2017   Stiff neck    limited right turn   Vitamin D deficiency      Past Surgical History:  Procedure Laterality Date   CHOLECYSTECTOMY     COLON SURGERY  08/2017   resection   COLONOSCOPY WITH PROPOFOL N/A 12/20/2017   Procedure: COLONOSCOPY WITH PROPOFOL;  Surgeon: [name], MD;  Location: [facility] ENDOSCOPY;  Service: Gastroenterology;  Laterality: N/A;   COLONOSCOPY WITH PROPOFOL N/A 07/30/2018   Procedure: COLONOSCOPY WITH PROPOFOL;  Surgeon: [name], MD;  Location: [facility] ENDOSCOPY;  Service: Gastroenterology;  Laterality: N/A;   COLONOSCOPY WITH PROPOFOL N/A 10/10/2018   Procedure:  COLONOSCOPY WITH PROPOFOL;  Surgeon: Lucilla Lame, MD;  Location: [facility] ENDOSCOPY;  Service: Endoscopy;  Laterality: N/A;   DIAGNOSTIC LAPAROSCOPY     ESOPHAGOGASTRODUODENOSCOPY (EGD) WITH PROPOFOL N/A 12/20/2017   Procedure: ESOPHAGOGASTRODUODENOSCOPY (EGD) WITH PROPOFOL;  Surgeon: [name], MD;  Location: [facility] ENDOSCOPY;  Service: Gastroenterology;  Laterality: N/A;   ESOPHAGOGASTRODUODENOSCOPY (EGD) WITH PROPOFOL  07/30/2018   Procedure: ESOPHAGOGASTRODUODENOSCOPY (EGD) WITH PROPOFOL;  Surgeon: [name], MD;  Location: [facility] ENDOSCOPY;  Service: Gastroenterology;;   [place] N/A 11/21/2018   Procedure: FLEXIBLE SIGMOIDOSCOPY;  Surgeon: [name], MD;  Location: [facility] ENDOSCOPY;  Service: Gastroenterology;  Laterality: N/A;   LAPAROTOMY N/A 08/31/2017   Procedure: EXPLORATORY LAPAROTOMY for SBO, ileocolectomy, removal of piece of uterine wall;  Surgeon: [name], MD;  Location: [facility] ORS;  Service: General;  Laterality: N/A;   LASER ABLATION CONDOLAMATA N/A 02/22/2018   Procedure: LASER ABLATION/REMOVAL OF [id number] AROUND ANUS AND VAGINA;  Surgeon: Michael Boston, MD;  Location: [place];  Service: General;  Laterality: N/A;   OOPHORECTOMY     PORTA CATH INSERTION N/A 05/13/2018   Procedure: PORTA CATH INSERTION;  Surgeon: Katha Cabal, MD;  Location: [facility];  Service: Cardiovascular;  Laterality: N/A;   SMALL INTESTINE SURGERY     TANDEM RING INSERTION     x3   THORACOTOMY     TOTAL KNEE ARTHROPLASTY Left 04/24/2018   Procedure: TOTAL KNEE ARTHROPLASTY;  Surgeon: Lovell Sheehan, MD;  Location: [facility] ORS;  Service: Orthopedics;  Laterality: Left;    Social History   Socioeconomic History   Marital status: Divorced    Spouse name: Not on file   Number of children: Not on file   Years of education: Not on file   Highest education level: Not on file  Occupational History   Not on file    Social Needs   Financial resource strain: Not hard at all   Food insecurity:    Worry: Never true    Inability: Never true   Transportation needs:    Medical: No    Non-medical: No  Tobacco Use   Smoking status: Former Smoker    Last attempt to quit: 10/16/2006    Years since quitting: 12.4   Smokeless tobacco: Never Used  Substance and Sexual Activity   Alcohol use: Not Currently    Frequency: Never    Comment: seldom   Drug use: Yes    Types: Marijuana   Sexual activity: Not Currently    Birth control/protection: Post-menopausal    Comment: Not Asked  Lifestyle   Physical activity:    Days per week: Patient refused    Minutes per session: Patient refused   Stress: Only a little  Relationships   Social connections:    Talks on phone: Patient refused    Gets together: Patient refused    Attends religious service: Patient refused    Active member of club or organization: Patient refused    Attends meetings of clubs or organizations: Patient refused    Relationship status:  Patient refused   Intimate partner violence:    Fear of current or ex partner: No    Emotionally abused: No    Physically abused: No    Forced sexual activity: No  Other Topics Concern   Not on file  Social History Narrative   Not on file    Family History  Problem Relation Age of Onset   Hypertension Father    Diabetes Father    Alcohol abuse Daughter    Hypertension Maternal Grandmother    Diabetes Maternal Grandmother    Diabetes Paternal Grandmother    Hypertension Paternal Grandmother      Current Outpatient Medications:    amitriptyline (ELAVIL) 75 MG tablet, Take 1 tablet (75 mg total) by mouth at bedtime., Disp: 90 tablet, Rfl: 1   budesonide (ENTOCORT EC) 3 MG 24 hr capsule, Take 3 capsules (9 mg total) by mouth daily for 30 days., Disp: 90 capsule, Rfl: 0   budesonide (ENTOCORT EC) 3 MG 24 hr capsule, Take 3 capsules (9 mg total) by mouth daily for 30  days., Disp: 90 capsule, Rfl: 2   Calcium Carb-Cholecalciferol (CALCIUM 500 +D) 500-400 MG-UNIT TABS, Take 2 tablets by mouth daily., Disp: , Rfl:    diphenoxylate-atropine (LOMOTIL) 2.5-0.025 MG tablet, TAKE 2 TABLETS BY MOUTH FOUR TIMES A DAY, Disp: 240 tablet, Rfl: 0   famotidine (PEPCID) 20 MG tablet, Take 1 tablet (20 mg total) by mouth 2 (two) times daily as needed for heartburn or indigestion., Disp: , Rfl:    hyoscyamine (LEVBID) 0.375 MG 12 hr tablet, Take 1 tablet (0.375 mg total)by mouth 2 (two) times daily., Disp: 60 tablet, Rfl: 0   meclizine (ANTIVERT) 25 MG tablet, Take 1 tablet (25 mg total) by mouth 2 (two) times daily as needed for dizziness., Disp: 15 tablet, Rfl: 0   Multiple Vitamins-Minerals (MULTIVITAMIN WITH MINERALS) tablet, Take 1 tablet by mouth daily., Disp: 30 tablet, Rfl: 0   ondansetron (ZOFRAN ODT) 4 MG disintegrating tablet, Take 1 tablet (4 mg total) by mouth every 8 (eight) hours as needed for nausea or vomiting., Disp: 30 tablet, Rfl: 2   oxyCODONE (OXYCONTIN) 15 mg 12 hr tablet, Take 1 tablet (15 mg total) by mouth every 12 (twelve) hours., Disp: 30 tablet, Rfl: 0   Oxycodone HCl 10 MG TABS, Take 1 tablet (10 mg total) by mouth every 6 (six) hours as needed (for breakthrough pain)., Disp: 90 tablet, Rfl: 0   pantoprazole (PROTONIX) 40 MG tablet, Take 1 tablet (40 mg total) by mouth daily., Disp: 90 tablet, Rfl: 1   potassium chloride SA (K-DUR,KLOR-CON) 20 MEQ tablet, Take 1 tablet (20 mEq total) by mouth daily., Disp: 30 tablet, Rfl: 3   promethazine (PHENERGAN) 25 MG tablet, Take 1 tablet (25 mg total) by mouth every 8 (eight) hours as needed for nausea or vomiting., Disp: 30 tablet, Rfl: 3   rivaroxaban (XARELTO) 20 MG TABS tablet, Take 1 tablet (20 mg total) by mouth daily with supper., Disp: 30 tablet, Rfl: 1   VENTOLIN HFA 108 (90 Base) MCG/ACT inhaler, Inhale 1-2 puffs into the lungs every 4 (four) hours as needed for shortness of breath., Disp:  1 Inhaler, Rfl: 1   VIBERZI 100 MG TABS, TAKE 1 TABLET BY MOUTH TWICE DAILY WITH A MEAL, Disp: 60 tablet, Rfl: 0 No current facility-administered medications for this visit.   Facility-Administered Medications Ordered in Other Visits:    heparin lock flush 100 unit/mL, 500 Units, Intravenous, Once, Corcoran, Drue Second, MD  heparin lock flush 100 unit/mL, 500 Units, Intracatheter, Once PRN, Charlaine Dalton R, MD   sodium chloride 0.9 % 1,000 mL with potassium chloride 20 mEq infusion, , Intravenous, Once, Honor Loh E, NP   sodium chloride flush (NS) 0.9 % injection 10 mL, 10 mL, Intravenous, Once, Borders, Kirt Boys, NP  Physical exam: There were no vitals filed for this visit. Physical Exam Constitutional:      Appearance: Normal appearance.  HENT:     Head: Normocephalic and atraumatic.  Eyes:     Pupils: Pupils are equal, round, and reactive to light.  Neck:     Musculoskeletal: Normal range of motion.  Cardiovascular:     Rate and Rhythm: Normal rate and regular rhythm.     Heart sounds: Normal heart sounds. No murmur.  Pulmonary:     Effort: Pulmonary effort is normal.     Breath sounds: Normal breath sounds. No wheezing.  Abdominal:     General: Bowel sounds are normal. There is no distension.     Palpations: Abdomen is soft.     Tenderness: There is no abdominal tenderness.  Musculoskeletal: Normal range of motion.  Skin:    General: Skin is warm and dry.     Findings: No rash.  Neurological:     Mental Status: She is alert and oriented to person, place, and time.  Psychiatric:        Judgment: Judgment normal.      CMP Latest Ref Rng & Units 03/24/2019  Glucose 70 - 99 mg/dL 102(H)  BUN 6 - 20 mg/dL 13  Creatinine 0.44 - 1.00 mg/dL 0.72  Sodium 135 - 145 mmol/L 137  Potassium 3.5 - 5.1 mmol/L 3.8  Chloride 98 - 111 mmol/L 103  CO2 22 - 32 mmol/L 26  Calcium 8.9 - 10.3 mg/dL 8.9  Total Protein 6.5 - 8.1 g/dL 7.3  Total Bilirubin 0.3 - 1.2 mg/dL 0.8    Alkaline Phos 38 - 126 U/L 150(H)  AST 15 - 41 U/L 40  ALT 0 - 44 U/L 56(H)   CBC Latest Ref Rng & Units 03/24/2019  WBC 4.0 - 10.5 K/uL 2.3(L)  Hemoglobin 12.0 - 15.0 g/dL 9.9(L)  Hematocrit 36.0 - 46.0 % 30.3(L)  Platelets 150 - 400 K/uL 123(L)    No images are attached to the encounter.  Dg Abd 1 View  Result Date: 03/12/2019 CLINICAL DATA:  Right flank pain EXAM: ABDOMEN - 1 VIEW COMPARISON:  CT abdomen pelvis 01/23/2019 FINDINGS: Lumbar scoliosis and multilevel disc degeneration. No acute skeletal abnormality. Surgical clips in the gallbladder fossa. Surgical staples in the right abdomen. Normal bowel gas pattern.Negative for urinary tract calculi. IMPRESSION: No acute abnormality. Lumbar spine scoliosis and degenerative change. Electronically Signed   By: Franchot Gallo M.D.   On: 03/12/2019 13:38     Assessment and plan- Patient is a 53 y.o. female who presents to [facility] for weakness and dehydration x 3 days.  Cervical cancer: Current treatment single agent Taxol s/p 6 cycles.  (01/29/19- current).  Tolerating better than combination therapy with carbo/Taxol.  Plan is for single agent Taxol 3w on with 1 w off.  Most recent imaging with CT abdomen/pelvis completed on 01/23/2019 revealed stable disease without interval change.  Plan is to rescan in 3 to 4 months.   Dehydration/weakness: Chronic and ongoing problem exacerbated with treatment.  Will check stat labs (CBC, CMP and magnesium) and begin IV fluids (1 L NaCl)  Chronic nausea: Continue Phenergan, Zofran and  famotidine PRN.  We will give 8 mg Zofran and 10 mg dexamethasone while in clinic.  Chronic diarrhea: Continue Viberzi, Lomotil and Imodium PRN.  Hypomagnesemia: Chronically low will replace if needed.  Magnesium 1.7 today.   Thrombocytopenia: Platelets 122,000.  Likely due to recent treatment.  No easy bruising or bleeding.  Anemia: Low but stable.   Elevated alk phos: Slowly trending up.  Continue to monitor.   Visit  Diagnosis 1. 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HCC)   2. Dehydration   3. Diarrhea, unspecified type     Patient expressed understanding and was in agreement with this plan. She also understands that She can call clinic at any time with any questions, concerns, or complaints.   Greater than 50% was spent in counseling and coordination of care with this patient including but not limited to discussion of the relevant topics above (See A&P) including, but not limited to diagnosis and management of acute and chronic medical conditions.   Thank you for allowing me to participate in the care of this very pleasant patient.    Jacquelin Hawking, NP [place] [facility] Cell - [phone] Pager- [phone] 03/25/2019 11:17 AM

## 2019-03-24 NOTE — Telephone Encounter (Signed)
Patient called reporting that she has been sick for a couple of days with nausea, diarrhea and weakness and would like to come in to be seen. Please advise

## 2019-03-24 NOTE — Telephone Encounter (Signed)
Patient accepts 1 PM appointment today

## 2019-03-26 ENCOUNTER — Other Ambulatory Visit: Payer: Self-pay

## 2019-03-27 ENCOUNTER — Inpatient Hospital Stay: Payer: Medicaid Other

## 2019-03-27 ENCOUNTER — Other Ambulatory Visit: Payer: Self-pay | Admitting: Internal Medicine

## 2019-03-27 ENCOUNTER — Other Ambulatory Visit: Payer: Self-pay

## 2019-03-27 VITALS — BP 131/76 | HR 72 | Temp 97.0°F | Resp 18 | Wt 172.2 lb

## 2019-03-27 DIAGNOSIS — C530 Malignant neoplasm of endocervix: Secondary | ICD-10-CM

## 2019-03-27 DIAGNOSIS — C538 Malignant neoplasm of overlapping sites of cervix uteri: Secondary | ICD-10-CM

## 2019-03-27 DIAGNOSIS — Z95828 Presence of other vascular implants and grafts: Secondary | ICD-10-CM

## 2019-03-27 DIAGNOSIS — Z5111 Encounter for antineoplastic chemotherapy: Secondary | ICD-10-CM | POA: Diagnosis not present

## 2019-03-27 DIAGNOSIS — Z7189 Other specified counseling: Secondary | ICD-10-CM

## 2019-03-27 LAB — COMPREHENSIVE METABOLIC PANEL
ALT: 38 U/L (ref 0–44)
AST: 27 U/L (ref 15–41)
Albumin: 3.4 g/dL — ABNORMAL LOW (ref 3.5–5.0)
Alkaline Phosphatase: 127 U/L — ABNORMAL HIGH (ref 38–126)
Anion gap: 5 (ref 5–15)
BUN: 13 mg/dL (ref 6–20)
CO2: 28 mmol/L (ref 22–32)
Calcium: 8.7 mg/dL — ABNORMAL LOW (ref 8.9–10.3)
Chloride: 105 mmol/L (ref 98–111)
Creatinine, Ser: 0.75 mg/dL (ref 0.44–1.00)
GFR calc Af Amer: >60 mL/min (ref >60)
GFR calc non Af Amer: >60 mL/min (ref >60)
Glucose, Bld: 100 mg/dL — ABNORMAL HIGH (ref 70–99)
Potassium: 3.2 mmol/L — ABNORMAL LOW (ref 3.5–5.1)
Sodium: 138 mmol/L (ref 135–145)
Total Bilirubin: 0.3 mg/dL (ref 0.3–1.2)
Total Protein: 6.5 g/dL (ref 6.5–8.1)

## 2019-03-27 LAB — CBC WITH DIFFERENTIAL/PLATELET
Abs Immature Granulocytes: 0.02 10*3/uL (ref 0.00–0.07)
Basophils Absolute: 0 10*3/uL (ref 0.0–0.1)
Basophils Relative: 0 %
Eosinophils Absolute: 0 10*3/uL (ref 0.0–0.5)
Eosinophils Relative: 1 %
HCT: 27 % — ABNORMAL LOW (ref 36.0–46.0)
Hemoglobin: 8.9 g/dL — ABNORMAL LOW (ref 12.0–15.0)
Immature Granulocytes: 1 %
Lymphocytes Relative: 28 %
Lymphs Abs: 0.6 10*3/uL — ABNORMAL LOW (ref 0.7–4.0)
MCH: 35.2 pg — ABNORMAL HIGH (ref 26.0–34.0)
MCHC: 33 g/dL (ref 30.0–36.0)
MCV: 106.7 fL — ABNORMAL HIGH (ref 80.0–100.0)
Monocytes Absolute: 0.2 10*3/uL (ref 0.1–1.0)
Monocytes Relative: 9 %
Neutro Abs: 1.3 10*3/uL — ABNORMAL LOW (ref 1.7–7.7)
Neutrophils Relative %: 61 %
Platelets: 114 10*3/uL — ABNORMAL LOW (ref 150–400)
RBC: 2.53 MIL/uL — ABNORMAL LOW (ref 3.87–5.11)
RDW: 13.9 % (ref 11.5–15.5)
WBC: 2.1 10*3/uL — ABNORMAL LOW (ref 4.0–10.5)
nRBC: 0 % (ref 0.0–0.2)

## 2019-03-27 LAB — MAGNESIUM: Magnesium: 1.4 mg/dL — ABNORMAL LOW (ref 1.7–2.4)

## 2019-03-27 MED ORDER — DIPHENHYDRAMINE HCL 50 MG/ML IJ SOLN
50.0000 mg | Freq: Once | INTRAMUSCULAR | Status: AC
  Administered 2019-03-27: 50 mg via INTRAVENOUS
  Filled 2019-03-27: qty 1

## 2019-03-27 MED ORDER — FAMOTIDINE IN NACL 20-0.9 MG/50ML-% IV SOLN
20.0000 mg | Freq: Once | INTRAVENOUS | Status: AC
  Administered 2019-03-27: 20 mg via INTRAVENOUS
  Filled 2019-03-27: qty 50

## 2019-03-27 MED ORDER — SODIUM CHLORIDE 0.9 % IV SOLN
Freq: Once | INTRAVENOUS | Status: AC
  Administered 2019-03-27: 10:00:00 via INTRAVENOUS
  Filled 2019-03-27: qty 250

## 2019-03-27 MED ORDER — DEXAMETHASONE SODIUM PHOSPHATE 10 MG/ML IJ SOLN
10.0000 mg | Freq: Once | INTRAMUSCULAR | Status: AC
  Administered 2019-03-27: 10 mg via INTRAVENOUS
  Filled 2019-03-27: qty 1

## 2019-03-27 MED ORDER — MAGNESIUM SULFATE 4 GM/100ML IV SOLN
4.0000 g | Freq: Once | INTRAVENOUS | Status: AC
  Administered 2019-03-27: 4 g via INTRAVENOUS
  Filled 2019-03-27: qty 100

## 2019-03-27 MED ORDER — SODIUM CHLORIDE 0.9% FLUSH
10.0000 mL | Freq: Once | INTRAVENOUS | Status: AC
  Administered 2019-03-27: 10 mL via INTRAVENOUS
  Filled 2019-03-27: qty 10

## 2019-03-27 MED ORDER — HEPARIN SOD (PORK) LOCK FLUSH 100 UNIT/ML IV SOLN
500.0000 [IU] | Freq: Once | INTRAVENOUS | Status: AC | PRN
  Administered 2019-03-27: 500 [IU]
  Filled 2019-03-27: qty 5

## 2019-03-27 MED ORDER — SODIUM CHLORIDE 0.9 % IV SOLN
65.0000 mg/m2 | Freq: Once | INTRAVENOUS | Status: AC
  Administered 2019-03-27: 126 mg via INTRAVENOUS
  Filled 2019-03-27: qty 21

## 2019-03-27 NOTE — Progress Notes (Signed)
Per Dr. Rogue Bussing pt to take 20 MEQs of Potassium by mouth BID. Pt aware and verbalizes understanding. Pt to receive treatment and 4Grams of IV magnesium.

## 2019-03-28 ENCOUNTER — Encounter: Payer: Self-pay | Admitting: Internal Medicine

## 2019-04-01 ENCOUNTER — Other Ambulatory Visit: Payer: Self-pay

## 2019-04-01 ENCOUNTER — Inpatient Hospital Stay (HOSPITAL_BASED_OUTPATIENT_CLINIC_OR_DEPARTMENT_OTHER): Payer: Medicaid Other | Admitting: Hospice and Palliative Medicine

## 2019-04-01 ENCOUNTER — Inpatient Hospital Stay: Payer: Medicaid Other

## 2019-04-01 ENCOUNTER — Encounter: Payer: Self-pay | Admitting: Oncology

## 2019-04-01 ENCOUNTER — Telehealth: Payer: Self-pay | Admitting: *Deleted

## 2019-04-01 DIAGNOSIS — C530 Malignant neoplasm of endocervix: Secondary | ICD-10-CM | POA: Diagnosis not present

## 2019-04-01 DIAGNOSIS — R531 Weakness: Secondary | ICD-10-CM

## 2019-04-01 DIAGNOSIS — C538 Malignant neoplasm of overlapping sites of cervix uteri: Secondary | ICD-10-CM

## 2019-04-01 DIAGNOSIS — E860 Dehydration: Secondary | ICD-10-CM | POA: Diagnosis not present

## 2019-04-01 DIAGNOSIS — C799 Secondary malignant neoplasm of unspecified site: Secondary | ICD-10-CM

## 2019-04-01 DIAGNOSIS — R110 Nausea: Secondary | ICD-10-CM

## 2019-04-01 DIAGNOSIS — Z5111 Encounter for antineoplastic chemotherapy: Secondary | ICD-10-CM | POA: Diagnosis not present

## 2019-04-01 LAB — COMPREHENSIVE METABOLIC PANEL
ALT: 42 U/L (ref 0–44)
AST: 29 U/L (ref 15–41)
Albumin: 4 g/dL (ref 3.5–5.0)
Alkaline Phosphatase: 131 U/L — ABNORMAL HIGH (ref 38–126)
Anion gap: 10 (ref 5–15)
BUN: 11 mg/dL (ref 6–20)
CO2: 22 mmol/L (ref 22–32)
Calcium: 9.3 mg/dL (ref 8.9–10.3)
Chloride: 103 mmol/L (ref 98–111)
Creatinine, Ser: 0.67 mg/dL (ref 0.44–1.00)
GFR calc Af Amer: >60 mL/min (ref >60)
GFR calc non Af Amer: >60 mL/min (ref >60)
Glucose, Bld: 102 mg/dL — ABNORMAL HIGH (ref 70–99)
Potassium: 3.8 mmol/L (ref 3.5–5.1)
Sodium: 135 mmol/L (ref 135–145)
Total Bilirubin: 0.5 mg/dL (ref 0.3–1.2)
Total Protein: 7.7 g/dL (ref 6.5–8.1)

## 2019-04-01 LAB — CBC WITH DIFFERENTIAL/PLATELET
Abs Immature Granulocytes: 0.01 10*3/uL (ref 0.00–0.07)
Basophils Absolute: 0 10*3/uL (ref 0.0–0.1)
Basophils Relative: 1 %
Eosinophils Absolute: 0 10*3/uL (ref 0.0–0.5)
Eosinophils Relative: 0 %
HCT: 30.6 % — ABNORMAL LOW (ref 36.0–46.0)
Hemoglobin: 10.2 g/dL — ABNORMAL LOW (ref 12.0–15.0)
Immature Granulocytes: 1 %
Lymphocytes Relative: 21 %
Lymphs Abs: 0.4 10*3/uL — ABNORMAL LOW (ref 0.7–4.0)
MCH: 34.9 pg — ABNORMAL HIGH (ref 26.0–34.0)
MCHC: 33.3 g/dL (ref 30.0–36.0)
MCV: 104.8 fL — ABNORMAL HIGH (ref 80.0–100.0)
Monocytes Absolute: 0.1 10*3/uL (ref 0.1–1.0)
Monocytes Relative: 5 %
Neutro Abs: 1.2 10*3/uL — ABNORMAL LOW (ref 1.7–7.7)
Neutrophils Relative %: 72 %
Platelets: 129 10*3/uL — ABNORMAL LOW (ref 150–400)
RBC: 2.92 MIL/uL — ABNORMAL LOW (ref 3.87–5.11)
RDW: 13.9 % (ref 11.5–15.5)
WBC: 1.7 10*3/uL — ABNORMAL LOW (ref 4.0–10.5)
nRBC: 0 % (ref 0.0–0.2)

## 2019-04-01 LAB — MAGNESIUM: Magnesium: 1.5 mg/dL — ABNORMAL LOW (ref 1.7–2.4)

## 2019-04-01 MED ORDER — HEPARIN SOD (PORK) LOCK FLUSH 100 UNIT/ML IV SOLN
500.0000 [IU] | Freq: Once | INTRAVENOUS | Status: AC
  Administered 2019-04-01: 500 [IU] via INTRAVENOUS
  Filled 2019-04-01: qty 5

## 2019-04-01 MED ORDER — SODIUM CHLORIDE 0.9 % IV SOLN
Freq: Once | INTRAVENOUS | Status: AC
  Administered 2019-04-01: 13:00:00 via INTRAVENOUS
  Filled 2019-04-01: qty 250

## 2019-04-01 MED ORDER — ONDANSETRON HCL 4 MG/2ML IJ SOLN
4.0000 mg | Freq: Once | INTRAMUSCULAR | Status: AC
  Administered 2019-04-01: 4 mg via INTRAVENOUS
  Filled 2019-04-01: qty 2

## 2019-04-01 MED ORDER — OXYCODONE HCL 5 MG PO TABS
10.0000 mg | ORAL_TABLET | ORAL | Status: DC | PRN
  Administered 2019-04-01: 10 mg via ORAL
  Filled 2019-04-01: qty 2

## 2019-04-01 MED ORDER — MAGNESIUM SULFATE 2 GM/50ML IV SOLN
2.0000 g | Freq: Once | INTRAVENOUS | Status: AC
  Administered 2019-04-01: 2 g via INTRAVENOUS
  Filled 2019-04-01: qty 50

## 2019-04-01 MED ORDER — SODIUM CHLORIDE 0.9 % IV SOLN: Freq: Once | INTRAVENOUS | Status: DC

## 2019-04-01 NOTE — Progress Notes (Signed)
Verbal order from Borders NP, to place oxycodone 10 mg Q3PRN, 1L of NS bolus to run over one hour, and zofran 4 mg IV once. Pt updated and all questions answered at this time. Pt.'s VSS.  Kamaile Zachow [employer]

## 2019-04-01 NOTE — Telephone Encounter (Signed)
Patient coming in now for lab/ NP/ IVF

## 2019-04-01 NOTE — Telephone Encounter (Signed)
Patient called requesting appointment for IV fluids today stating that she has vomiting and watery diarrhea. Pleas advise

## 2019-04-01 NOTE — Progress Notes (Signed)
[facility]  Telephone:([phone]) [phone] Fax:([phone]) [phone]  Patient Care Team: Arnetha Courser, MD as PCP - General (Family Medicine) Mellody Drown, MD as Consulting Physician (Obstetrics and Gynecology) Lenor Coffin, MD as Attending Physician (Gynecology) Lequita Asal, MD as Medical Oncologist (Hematology and Oncology) [name], MD as Consulting Physician (Gastroenterology) Michael Boston, MD as Consulting Physician (General Surgery) Lovell Sheehan, MD as Consulting Physician (Orthopedic Surgery) Clent Jacks, RN as Registered Nurse   Name of the patient: Joanna Hall  [id number]  04/29/1966   Date of visit: 04/01/19  Diagnosis- Cervical cancer  Chief complaint/ Reason for visit- weakness, nausea  Heme/Onc history:  Oncology History Overview Note  # dec 2017- CERVICAL ADENO CA [[facility]]; Stage IB [Dr. Christene Slates at [facility] in [place], [place];No surgery- Chemo-RT;  # 2018-sep - lung nodules [in ]  # AUG 20th, 2019-#1 Carbo-Taxol s/p 6 cycles- [No avastin sec to blood clots]: Feb 2020-bilateral lung nodules with 1 cm in size. March 2020- finished carbo-taxol #7; poor tolerance to [place].  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  She received treatment from 11/2016-05/2017.  01/2017 cisplatin x 2 and carboplatin x 1 (01/29/2017) due to ARF and XRT.  XRT was followed by T & O on 02/01/2017 and T & N 02/10/2017 and 02/20/2017.  Course was complicated  by 80 pound weight loss, nausea, vomiting, electrolyte wasting (potassium and magnesium).  She describes that.  Is been sick constantly requiring at least 20 hospitalizations.  Follow-up CT chest and PET on 06/2017. Per patient, 'radiation worked' and no disease in the abdomen.  At that time she was noted to have lung nodules that were growing and follow-up imaging was scheduled for 10/2017.  She was admitted to hospital in [place] for small bowel obstruction which was managed conservatively and she was home for a week prior to traveling to [place] for Thanksgiving holiday where she has family.  She presented to ER in [place] on 08/2017 with nausea, vomiting, and lower abdominal pain.  Symptoms did not respond to conservative treatment.  CT on 08/26/2017 revealed small bowel obstruction with transition in the pelvis just superior to the uterus rather was a long segment of distal ileum with fatty wall thickening compatible with chronic inflammation and/or radiation enteritis. Imaging showed numerous pulmonary nodules consistent with metastatic disease. She underwent laparotomy and right ileocolectomy on 08/31/2017 at [facility].  Surgical findings revealed a thickened, matted, and scarred piece of distal small bowel close to the ileocecal valve.  She was discharged on 09/05/2017.  Pain markedly increased in intensity and imaging was performed on 09/11/2017 which revealed: Debris within the anterior abdominal wall incision concerning for infection versus packing material, s/p post ileo-colectomy with expected postoperative changes, mild colonic ileus, numerous pulmonary nodules highly concerning for metastatic disease, punctate nonobstructing nephrolithiasis.  Staples were removed and one was packed.  She was started on doxycycline.  Abdominal and pelvic CT without contrast on 09/11/2017 revealed debris within anterior abdominal wall incision concerning for infection, versus packing  material.She is s/pileocolectomy with  expected postoperative changes and mild colonic ileus. There were numerous pulmonary nodules highly concerning for metastatic diseaseand punctate nonobstructing nephrolithiasis. She was readmitted on 09/12/2017. She describes the onset of lower abdominal pain on 09/09/2017. Pain markedly increased in intensity on 09/11/2017.  Staples were removed and the wound packed. She was started on doxycycline.  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  PET scan was ordered to evaluate enlarging lung nodules with concern for recurrent cervical cancer but scan was delayed due to insurance and need to be performed in [place].  Presented to ER on 12/03/2017 for abdominal pain and emesis.  Imaging concerning for worsening possible uterine infarct and she was admitted to hospital.  Pelvic MRI was unremarkable.  Remote scarring type changes of uterus thought to be possibly related to radiation.  She was discharged on 12/08/2017.  Underwent endoscopy and colonoscopy on 12/20/2017.    On 02/22/2018 she underwent laser ablation of condylomata around the anus and vagina under anesthesia with Dr. Johney Maine.   04/15/2018: Chest, abdomen, and pelvis CTrevealed innumerable (>100) cavitary nodules scattered in the lungs, moderately enlarging compared to the 11/08/2017 PET-CT, suspicious for metastatic disease. One index node in the RLL measures 1.0 x 1.1 cm (previously 0.6 x 0.6 cm). There were no new nodules. There was an ill-defined wall thickening in the rectosigmoid with surrounding stranding  along fascia planes, probably sequela from prior radiation therapy. There was multilevel lumbar impingement due to spondylosis and degenerative disc disease. There was heterogeneous enhancement in the uterus (some possibly from prior radiation therapy).  04/23/2018:PET scan revealednumerous scattered solid and cavitary nodules in the lungs stable increased in size compared to the prior PET-CT from 11/08/2017.Largest nodule was 1.1 cm in the LUL (SUV 1.9).These demonstratedlow-grade metabolic activity up to a maximum SUV of about 2.3, increased from 11/08/2017.   Case was discussed at tumor board on 04/25/2018. Consensus to pursue CT-guided biopsy (05/06/18) which revealed: Metastatic adenocarcinoma, morphologically consistent with cervical adenocarcinoma.  She has history of chronic hepatitis C which is managed by GI.  Hepatitis C genotype is 2a/2c.  She receives B12 injections for history of B12 deficiency.  On 04/24/2018 she underwent left total knee replacement with Dr. [name]. --------------------------------------------------------   DIAGNOSIS: Cervical adenocarcinoma  STAGE: IV        ;GOALS: Palliative  CURRENT/MOST RECENT THERAPY : Taxol single agent    Malignant neoplasm of overlapping sites of cervix ([place])  01/29/2019 -  Chemotherapy   The patient had PACLitaxel (TAXOL) 156 mg in sodium chloride 0.9 % 250 mL chemo infusion (</= 58m/m2), 80 mg/m2 = 156 mg, Intravenous,  Once, 3 of 4 cycles Dose modification: 65 mg/m2 (original dose 80 mg/m2, Cycle 1, Reason: Provider Judgment) Administration: 156 mg (01/29/2019), 156 mg (02/05/2019), 126 mg (02/12/2019), 126 mg (02/26/2019), 126 mg (03/13/2019), 126 mg (03/27/2019), 126 mg (03/20/2019)  for chemotherapy treatment.      Interval history-patient has several days of worsening weakness, nausea/vomiting, and diarrhea.  Presenting pattern is recurrent and occurs frequently following treatment.  Last treatment was on 03/27/2019.  Patient  generally feels better following IV fluids.  No fever or chills today.  Patient has  had overall worsening weakness and shortness of breath over the past several months.   ECOG FS:2 - Symptomatic, <50% confined to bed  Review of systems- Review of Systems  Constitutional: Positive for malaise/fatigue. Negative for chills, diaphoresis and fever.  Respiratory: Positive for shortness of breath. Negative for cough and wheezing.   Cardiovascular: Negative for chest pain and orthopnea.  Gastrointestinal: Positive for diarrhea, nausea and vomiting. Negative for abdominal pain, blood in stool and constipation.  Genitourinary: Negative for flank pain.  Neurological: Positive for dizziness and weakness. Negative for sensory change, focal weakness and headaches.     Current treatment- Taxol  Allergies  Allergen Reactions   Ketamine Anxiety and Other (See Comments)    Syncope episode/confusion     Past Medical History:  Diagnosis Date   Abdominal pain 06/10/2018   Abnormal cervical Papanicolaou smear 09/18/2017   Anxiety    Aortic atherosclerosis (HCC)    Arthritis    neck and knees   Blood clots in brain    both lungs and right kidney   Blood transfusion without reported diagnosis    Cervical cancer ([place]) 09/2016   mets lung   Chronic anal fissure    Chronic diarrhea    Dyspnea    Erosive gastropathy 09/18/2017   Factor V Leiden mutation ([place])    Fecal incontinence    Genital warts    GERD (gastroesophageal reflux disease)    Heart murmur    Hematochezia    Hemorrhoids    Hepatitis C    Chronic, 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   Intestinal infection due to Clostridium difficile  09/18/2017   Macrocytic anemia with vitamin B12 deficiency    Multiple gastric ulcers    Nausea vomiting and diarrhea    Pancolitis ([place]) 07/27/2018   Perianal condylomata    Pneumonia    History of   Pulmonary nodules    Rectal bleeding    Small bowel obstruction ([place]) 08/2017   Stiff neck    limited right turn   Vitamin D deficiency     Past Surgical History:  Procedure Laterality Date   CHOLECYSTECTOMY     COLON SURGERY  08/2017   resection   COLONOSCOPY WITH PROPOFOL N/A 12/20/2017   Procedure: COLONOSCOPY WITH PROPOFOL;  Surgeon: [name], MD;  Location: [facility];  Service: Gastroenterology;  Laterality: N/A;   COLONOSCOPY WITH PROPOFOL N/A 07/30/2018   Procedure: COLONOSCOPY WITH PROPOFOL;  Surgeon: [name], MD;  Location: [facility] ENDOSCOPY;  Service: Gastroenterology;  Laterality: N/A;   COLONOSCOPY WITH PROPOFOL N/A 10/10/2018   Procedure: COLONOSCOPY WITH PROPOFOL;  Surgeon: Lucilla Lame, MD;  Location: [facility] ENDOSCOPY;  Service: Endoscopy;  Laterality: N/A;   DIAGNOSTIC LAPAROSCOPY     ESOPHAGOGASTRODUODENOSCOPY (EGD) WITH PROPOFOL N/A 12/20/2017   Procedure: ESOPHAGOGASTRODUODENOSCOPY (EGD) WITH PROPOFOL;  Surgeon: [name], MD;  Location: [facility] ENDOSCOPY;  Service: Gastroenterology;  Laterality: N/A;   ESOPHAGOGASTRODUODENOSCOPY (EGD) WITH PROPOFOL  07/30/2018   Procedure: ESOPHAGOGASTRODUODENOSCOPY (EGD) WITH PROPOFOL;  Surgeon: [name], MD;  Location: [place];  Service: Gastroenterology;;   [place] N/A 11/21/2018   Procedure: FLEXIBLE SIGMOIDOSCOPY;  Surgeon: [name], MD;  Location: [facility] ENDOSCOPY;  Service: Gastroenterology;  Laterality: N/A;   LAPAROTOMY N/A 08/31/2017   Procedure: EXPLORATORY LAPAROTOMY for SBO, ileocolectomy, removal of piece of uterine wall;  Surgeon: [name], MD;  Location: [facility] ORS;  Service: General;  Laterality: N/A;   LASER ABLATION  CONDOLAMATA N/A 02/22/2018   Procedure: LASER ABLATION/REMOVAL OF [id number] AROUND ANUS AND VAGINA;  Surgeon: Michael Boston, MD;  Location: [place] [facility];  Service: General;  Laterality: N/A;   OOPHORECTOMY     PORTA CATH INSERTION N/A 05/13/2018   Procedure: PORTA CATH INSERTION;  Surgeon: Katha Cabal, MD;  Location: [facility] CV [facility];  Service: Cardiovascular;  Laterality: N/A;   SMALL INTESTINE SURGERY     TANDEM RING INSERTION     x3   THORACOTOMY     TOTAL KNEE ARTHROPLASTY Left 04/24/2018   Procedure: TOTAL KNEE ARTHROPLASTY;  Surgeon: Lovell Sheehan, MD;  Location: [facility] ORS;  Service: Orthopedics;  Laterality: Left;    Social History   Socioeconomic History   Marital status: Divorced    Spouse name: Not on file   Number of children: Not on file   Years of education: Not on file   Highest education level: Not on file  Occupational History   Not on file  Social Needs   Financial resource strain: Not hard at all   Food insecurity    Worry: Never true    Inability: Never true   Transportation needs    Medical: No    Non-medical: No  Tobacco Use   Smoking status: Former Smoker    Quit date: 10/16/2006    Years since quitting: 12.4   Smokeless tobacco: Never Used  Substance and Sexual Activity   Alcohol use: Not Currently    Frequency: Never    Comment: seldom   Drug use: Yes    Types: Marijuana   Sexual activity: Not Currently    Birth control/protection: Post-menopausal    Comment: Not Asked  Lifestyle   Physical activity    Days per week: Patient refused    Minutes per session: Patient refused   Stress: Only a little  Relationships   Press photographer on phone: Patient refused    Gets together: Patient refused    Attends religious service: Patient refused    Active member of club or organization: Patient refused    Attends meetings of clubs or organizations: Patient refused    Relationship status:  Patient refused   Intimate partner violence    Fear of current or ex partner: No    Emotionally abused: No    Physically abused: No    Forced sexual activity: No  Other Topics Concern   Not on file  Social History Narrative   Not on file    Family History  Problem Relation Age of Onset   Hypertension Father    Diabetes Father    Alcohol abuse Daughter    Hypertension Maternal Grandmother    Diabetes Maternal Grandmother    Diabetes Paternal Grandmother    Hypertension Paternal Grandmother      Current Outpatient Medications:    amitriptyline (ELAVIL) 75 MG tablet, Take 1 tablet (75 mg total) by mouth at bedtime., Disp: 90 tablet, Rfl: 1   budesonide (ENTOCORT EC) 3 MG 24 hr capsule, Take 3 capsules (9 mg total) by mouth daily for 30 days., Disp: 90 capsule, Rfl: 0   budesonide (ENTOCORT EC) 3 MG 24 hr capsule, Take 3 capsules (9  mg total) by mouth daily for 30 days., Disp: 90 capsule, Rfl: 2   Calcium Carb-Cholecalciferol (CALCIUM 500 +D) 500-400 MG-UNIT TABS, Take 2 tablets by mouth daily., Disp: , Rfl:    diphenoxylate-atropine (LOMOTIL) 2.5-0.025 MG tablet, TAKE 2 TABLETS BY MOUTH FOUR TIMES A DAY, Disp: 240 tablet, Rfl: 0   famotidine (PEPCID) 20 MG tablet, Take 1 tablet (20 mg total) by mouth 2 (two) times daily as needed for heartburn or indigestion., Disp: , Rfl:    hyoscyamine (LEVBID) 0.375 MG 12 hr tablet, Take 1 tablet (0.375 mg total)by mouth 2 (two) times daily., Disp: 60 tablet, Rfl: 0   meclizine (ANTIVERT) 25 MG tablet, Take 1 tablet (25 mg total) by mouth 2 (two) times daily as needed for dizziness., Disp: 15 tablet, Rfl: 0   Multiple Vitamins-Minerals (MULTIVITAMIN WITH MINERALS) tablet, Take 1 tablet by mouth daily., Disp: 30 tablet, Rfl: 0   ondansetron (ZOFRAN ODT) 4 MG disintegrating tablet, Take 1 tablet (4 mg total) by mouth every 8 (eight) hours as needed for nausea or vomiting., Disp: 30 tablet, Rfl: 2   oxyCODONE (OXYCONTIN) 15 mg 12  hr tablet, Take 1 tablet (15 mg total) by mouth every 12 (twelve) hours., Disp: 30 tablet, Rfl: 0   Oxycodone HCl 10 MG TABS, Take 1 tablet (10 mg total) by mouth every 6 (six) hours as needed (for breakthrough pain)., Disp: 90 tablet, Rfl: 0   pantoprazole (PROTONIX) 40 MG tablet, Take 1 tablet (40 mg total) by mouth daily., Disp: 90 tablet, Rfl: 1   potassium chloride SA (K-DUR,KLOR-CON) 20 MEQ tablet, Take 1 tablet (20 mEq total) by mouth daily., Disp: 30 tablet, Rfl: 3   promethazine (PHENERGAN) 25 MG tablet, Take 1 tablet (25 mg total) by mouth every 8 (eight) hours as needed for nausea or vomiting., Disp: 30 tablet, Rfl: 3   rivaroxaban (XARELTO) 20 MG TABS tablet, Take 1 tablet (20 mg total) by mouth daily with supper., Disp: 30 tablet, Rfl: 1   VENTOLIN HFA 108 (90 Base) MCG/ACT inhaler, Inhale 1-2 puffs into the lungs every 4 (four) hours as needed for shortness of breath., Disp: 1 Inhaler, Rfl: 1   VIBERZI 100 MG TABS, TAKE 1 TABLET BY MOUTH TWICE DAILY WITH A MEAL, Disp: 60 tablet, Rfl: 0 No current facility-administered medications for this visit.   Facility-Administered Medications Ordered in Other Visits:    heparin lock flush 100 unit/mL, 500 Units, Intravenous, Once, Corcoran, Melissa C, MD   heparin lock flush 100 unit/mL, 500 Units, Intracatheter, Once PRN, Cammie Sickle, MD   ondansetron [facility]) injection 4 mg, 4 mg, Intravenous, Once, [place] Troia, Vonna Kotyk R, NP   oxyCODONE (Oxy IR/ROXICODONE) immediate release tablet 10 mg, 10 mg, Oral, Q3H PRN, Baelynn Schmuhl, Vonna Kotyk R, NP   sodium chloride 0.9 % 1,000 mL with potassium chloride 20 mEq infusion, , Intravenous, Once, Honor Loh E, NP   sodium chloride flush (NS) 0.9 % injection 10 mL, 10 mL, Intravenous, Once, Lyden Redner, Kirt Boys, NP  Physical exam: There were no vitals filed for this visit. Physical Exam Constitutional:      Appearance: Normal appearance.  HENT:     Mouth/Throat:     Mouth: Mucous membranes are  moist.  Cardiovascular:     Rate and Rhythm: Normal rate and regular rhythm.  Pulmonary:     Effort: Pulmonary effort is normal.     Breath sounds: Normal breath sounds.  Abdominal:     General: There is no distension.  Palpations: Abdomen is soft.     Tenderness: There is no abdominal tenderness. There is no guarding.  Skin:    General: Skin is warm and dry.  Neurological:     General: No focal deficit present.     Mental Status: She is alert and oriented to person, place, and time. Mental status is at baseline.      CMP Latest Ref Rng & Units 04/01/2019  Glucose 70 - 99 mg/dL 102(H)  BUN 6 - 20 mg/dL 11  Creatinine 0.44 - 1.00 mg/dL 0.67  Sodium 135 - 145 mmol/L 135  Potassium 3.5 - 5.1 mmol/L 3.8  Chloride 98 - 111 mmol/L 103  CO2 22 - 32 mmol/L 22  Calcium 8.9 - 10.3 mg/dL 9.3  Total Protein 6.5 - 8.1 g/dL 7.7  Total Bilirubin 0.3 - 1.2 mg/dL 0.5  Alkaline Phos 38 - 126 U/L 131(H)  AST 15 - 41 U/L 29  ALT 0 - 44 U/L 42   CBC Latest Ref Rng & Units 04/01/2019  WBC 4.0 - 10.5 K/uL 1.7(L)  Hemoglobin 12.0 - 15.0 g/dL 10.2(L)  Hematocrit 36.0 - 46.0 % 30.6(L)  Platelets 150 - 400 K/uL 129(L)    No images are attached to the encounter.  Dg Abd 1 View  Result Date: 03/12/2019 CLINICAL DATA:  Right flank pain EXAM: ABDOMEN - 1 VIEW COMPARISON:  CT abdomen pelvis 01/23/2019 FINDINGS: Lumbar scoliosis and multilevel disc degeneration. No acute skeletal abnormality. Surgical clips in the gallbladder fossa. Surgical staples in the right abdomen. Normal bowel gas pattern.Negative for urinary tract calculi. IMPRESSION: No acute abnormality. Lumbar spine scoliosis and degenerative change. Electronically Signed   By: Franchot Gallo M.D.   On: 03/12/2019 13:38    Assessment - Patient is a 53 y.o. female with cervical cancer currently on treatment with single agent Taxol (last received 03/27/2019) who presents to the clinic with recurrent weakness and N/V/D, most recently with  symptoms over the past few days.   Cervical Cancer: currently on single agent Taxol status post 6 cycles.  Discussed with Dr. Rogue Bussing regarding plan for reimaging.  He will see her next week and decide.  Dehydration/weakness - secondary to recurrent n/v/d, most likely related to active chemotherapy in patient with advanced disease.  Check labs.  We will plan for IV fluids today in clinic.  Pursue symptom management with analgesics, antiemetics and antidiarrheals.  Case and plan discussed with Dr. Rogue Bussing.   Plan: -Check CBC, CMET, Mg -1L NS today -Zofran 27m IV x 1 -Oxycodone 131mx1 -Continue Phenergan, Zofran and famotidine PRN -Continue Viberzi, Lomotil and Imodium PRN -Follow up visit with Dr. BrTish Menext week to discuss reimaging -Patient to see GYN Onc tomorrow      Visit Diagnosis No diagnosis found.  Patient expressed understanding and was in agreement with this plan. She also understands that She can call clinic at any time with any questions, concerns, or complaints.   Thank you for allowing me to participate in the care of this very pleasant patient.   Time Total: 30 minutes  Visit consisted of counseling and education dealing with the complex and emotionally intense issues of symptom management and palliative care in the setting of serious and potentially life-threatening illness.Greater than 50%  of this time was spent counseling and coordinating care related to the above assessment and plan.  Signed by: JoAltha HarmPhD, NP-C [phone] Cell)   CC:

## 2019-04-02 ENCOUNTER — Telehealth: Payer: Self-pay | Admitting: Internal Medicine

## 2019-04-02 ENCOUNTER — Other Ambulatory Visit: Payer: Self-pay

## 2019-04-02 ENCOUNTER — Encounter: Payer: Self-pay | Admitting: Obstetrics and Gynecology

## 2019-04-02 ENCOUNTER — Inpatient Hospital Stay (HOSPITAL_BASED_OUTPATIENT_CLINIC_OR_DEPARTMENT_OTHER): Payer: Medicaid Other | Admitting: Obstetrics and Gynecology

## 2019-04-02 ENCOUNTER — Inpatient Hospital Stay: Payer: Medicaid Other | Admitting: Hospice and Palliative Medicine

## 2019-04-02 VITALS — BP 117/81 | HR 111 | Temp 97.7°F | Wt 167.1 lb

## 2019-04-02 DIAGNOSIS — Z9221 Personal history of antineoplastic chemotherapy: Secondary | ICD-10-CM

## 2019-04-02 DIAGNOSIS — C538 Malignant neoplasm of overlapping sites of cervix uteri: Secondary | ICD-10-CM

## 2019-04-02 DIAGNOSIS — Z923 Personal history of irradiation: Secondary | ICD-10-CM

## 2019-04-02 DIAGNOSIS — Z5111 Encounter for antineoplastic chemotherapy: Secondary | ICD-10-CM | POA: Diagnosis not present

## 2019-04-02 DIAGNOSIS — C539 Malignant neoplasm of cervix uteri, unspecified: Secondary | ICD-10-CM

## 2019-04-02 DIAGNOSIS — Z90722 Acquired absence of ovaries, bilateral: Secondary | ICD-10-CM | POA: Diagnosis not present

## 2019-04-02 NOTE — Progress Notes (Signed)
Gynecologic Oncology Interval Visit   Referring Provider: Dr. Rogue Bussing  Chief Concern: Recurrent stage IB1 cervical cancer s/p chemoradiation with ileocolectomy for bowel obstruction.  Subjective:  Joanna Hall is a 53 y.o. female, with history of untreated hepatitis c and diagnosed with recurrent stage IB1 cervical adenocarcinoma, seen in consultation for Dr. Rogue Bussing, returns to clinic today for follow-up.  December 2017- She was diagnosed with probable recurrent stage IB1 cervical adenocarcinoma status post aborted radical hysterectomy due to infected adnexa on 2/18 at [facility] followed by chemo-radiation with apparent complete response.  September 2018- She developed bowel obstruction and had resection of terminal ileum and cecum 11/18 at [facility]. Imaging showed enlarging lung mets which were biopsy proven metastatic cervical adenocarcinoma.  She initiated carboplatin-taxol on 06/04/18 and completed 6 cycles. No avastin secondary to blood clots/bilateral PE/kidney infarct; Factor V Leiden- Xarelto (small bowel obstruction). February 2020 - bilateral lung nodules ~1cm in size. March 2020 - completed carbo-taxol #7. Carbo discontinued secondary to poor tolerance/multiple hospitalizations.   January 29, 2019- started taxol weekly x 3, one week off.   CT A/P 01/2019 showed no evidence of progressive disease in the abdomen pelvis with stable bowel thickening. Lung nodule was stable.   Given PDL-1 positivity, recommendation for Beryle Flock has been discussed if progressive disease.   Today, she reports tolerating chemotherapy with taxol alone.  Does need frequent IV hydration for diarrhea.  Gynecologic Oncology History The patient was diagnosed with cervical adenocarcinoma in [place] in 09/2016.  She has had a long standing history of abnormal PAP smears.  She presented with an ovarian cyst.  Laparoscopic surgery was difficult secondary to scar tissue.  Had BSO and rupture of cyst with  purulent drainage into abdomen.  Had Stage IB1 (4 cm) endocervical adenocarcinoma. Radical hysterectomy aborted.   She was treated by Dr Christene Slates at [facility] in [place], [place]. Decision was made to pursue concurrent chemotherapy (weekly cisplatin) and radiation.  She received treatment from 11/2016 - 05/2017.  01/2017 cisplatin x 2 and Carboplatin x 1 (01/29/17) due to ARF and XRT. XRT followed by T & O on 02/01/17 and T & N 4.28/18 and 02/20/17 . Course was complicated by weight loss (80 pounds), nausea, vomiting, electrolyte wasting (potassium and magnesium).  She describes being "sick constantly" and requiring at least 20 hospitalizations.   She underwent follow-up chest CT then PET scan in 06/2017.  She states that "the radiation worked" and there was no disease in the abdomen.  She was noted to have lung nodules that were growing.  She was scheduled to have follow-up imaging in 10/2017.  She was admitted in [place] with a small bowel obstruction.  She was managed conservatively.  She was home for about a week then traveled to [place] for Thanksgiving holiday (she has family here).  She presented on 08/27/2017 with nausea, vomiting, and lower abdominal pain.  Symptoms did not respond to conservative measurement.    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  She underwent laparotomy and right ileocolectomy on 08/31/2017 at [facility].  There were numerous pulmonary nodules c/w metastatic disease.  Surgical findings revealed a thickened, matted, and scarred piece of distal small bowel close to the ileocecal valve.  Diet was slowly advanced.  She was discharged on 09/05/2017.  Abdominal and pelvic CT without contrast on 09/11/2017 revealed debris  within anterior abdominal wall incision concerning for infection, versus packing material.   She is s/p ileocolectomy with expected postoperative changes and mild colonic ileus.  There were numerous pulmonary nodules highly concerning for metastatic disease and punctate nonobstructing nephrolithiasis. She was readmitted on 09/12/2017.  She describes the onset of lower abdominal pain on 09/09/2017.  Pain markedly increased in intensity on 09/11/2017.   Staples were removed and the wound packed.  She was started on doxycycline.  Abdomen and pelvic CT with contrast revealed postsurgical changes from ileocecectomy with primary ileocolic anastomosis without evidence of abscess or leak.  There was edema of small bowel loops at distal ileum potentially related to surgery.  There was gas within ventral midline surgical wound corresponding to wound infection versus packing material.  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  She denies any history of thrombosis.  She denies any family history of thrombosis.  She stopped smoking 10 years ago (1/4 ppd) and has a history of thoracotomy.  She denies any family history of rheumatologic problems/vasculitis/autoimmune disorders.  Thrombosis work up showed she is heterozygous for Factor V Leiden and taking Xarelto.  Patient had diarrhea despite Lomotil and Imodium with 15 watery BMs per day.  Dr. Hampton Abbot who did the bowel resection managing.  PET scan ordered to evaluate enlarging lung nodules with concern for recurrent cervical cancer. Scan not done yet due to insurance issues and she will have to get the study done in [place].   She was having chronic diarrhea postoperatively and has history of chronic hep C and was seen by surgery and GI for management.  On 12/03/2017 she presented to ER for abdominal pain and emesis.  Imaging revealed:  12/03/17- CT A/P: continued multiple pulmonary nodules inthelung bases concerning  for metastatic disease. There was mild wall thickening of rectosigmoid colon is noted.There was a stable 1.8 x 1.6 cm mixed fat and soft tissue densitynoted in right lower quadrant which may represent fat necrosis.There was a larger low density is noted involving uterine fundus.   12/03/2017:  Pelvic MRI was unremarkable.   04/15/2018:  Chest, abdomen, and pelvis CT revealed innumerable (> 100) cavitary nodules scattered in the lungs, moderately enlarging compared to the 11/08/2017 PET-CT, suspicious for metastatic disease.  One index node in the RLL measures 1.0 x 1.1 cm (previously 0.6 x 0.6 cm).  There were no new nodules.  There was an ill-defined wall thickening in the rectosigmoid with surrounding stranding along fascia planes, probably sequela from prior radiation therapy.  There was multilevel lumbar impingement due to spondylosis and degenerative disc disease.  There was heterogeneous enhancement in the uterus (some possibly from prior radiation therapy).  04/23/2018:  PET scan revealed numerous scattered solid and cavitary nodules in the lungs stable increased in size compared to the prior PET-CT from 11/08/2017. Largest nodule was 1.1 cm in the LUL (SUV 1.9).  These demonstrated low-grade metabolic activity up to a maximum SUV of about 2.3, increased from 11/08/2017.   On 04/24/2018 she underwent left total knee replacement with Dr. [name].  She is hepatitis C positive.  Hepatitis C genotype is 2a/2c.  She received B12 injections for history of B12 deficiency.   Case was discussed at tumor board on 04/25/2018 since this was to pursue CT-guided biopsy of left upper lobe pulmonary nodule which was performed on 05/06/18- Pathology: Metastatic adenocarcinoma morphologically consistent with cervical  adenocarcinoma.   06/04/18 She initiated carboplatin-Taxol.    Foundation One testing was positive for PD-L1.   CT A/P w/ contrast 07/27/18 for abdominal pain/nausea/diarrhea Diffuse thickening  of the walls of the entire colon, most prominent within the transverse colon and worsened thickening of the walls of the transverse colon compared to earlier episodes, consistent with recurrent colitis of infectious or inflammatory nature, possibly radiation related. Stable small lung nodules compatible with metastatic disease.   She was admitted to hospital late December for lower GI bleed.  Colonoscopy revealed 3 bleeding terminal ileal ulcers but no anastomotic ulcer.  Xarelto was held.  She presented to clinic reporting vaginal spotting and was seen in symptom management by Beckey Rutter, NP on 10/15/2018.  Exam revealed small ulceration in vaginal cuff as possible previous source of bleeding now healing.  She was also reporting dysuria at that time.  UA was consistent with UTI and she was started on Macrobid however, culture was negative.  Given pain and symptoms imaging was performed.  10/17/2018-CT a/P w/ contrast which was positive for SBO at left hemiabdomen. No discrete mass and felt to possibly reflect adhesions. Bilateral pulmonary nodules compatible with metastatic disease.   09/30/2018-CT chest w contrast 1. Interval decrease in size and number of metastatic pulmonary nodules. 2. No new/progressive findings in the chest or upper abdomen. Aortic Atherosclerosis (ICD10-I70.0) and Emphysema (ICD10-J43.9).  Problem List: Patient Active Problem List   Diagnosis Date Noted  . Cancer associated pain   . Proctitis, radiation   . Palliative care encounter   . Encounter for monitoring opioid maintenance therapy 11/19/2018  . GI bleed 10/08/2018  . Adrenal insufficiency ([place]) 09/03/2018  . Chronic diarrhea 06/25/2018  . Chronic anticoagulation 06/25/2018  . Vomiting   . Encounter for antineoplastic chemotherapy 06/04/2018  . 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 Lung metastasis ([place]) 05/15/2018  . Closed fracture of distal end of radius 05/04/2018  . History of total knee arthroplasty  04/24/2018  . Osteoarthritis of left knee 02/28/2018  . Condyloma acuminatum of anus s/p ablation 02/22/2018 02/22/2018  . Condyloma acuminatum of vagina s/p ablation 02/22/2018 02/22/2018  . Positive ANA (antinuclear antibody) 02/04/2018  . Aortic atherosclerosis ([place]) 01/15/2018  . Elevated MCV 01/15/2018  . Anemia 01/15/2018  . Genital warts 01/15/2018  . Vitamin D deficiency 01/15/2018  . Pernicious anemia   . Impingement syndrome of shoulder region 12/19/2017  . Multiple joint pain 12/19/2017  . Osteoarthritis of right knee 12/19/2017  . B12 deficiency 12/11/2017  . Lung nodules 12/11/2017  . History of Clostridium difficile infection 10/23/2017  . Factor V Leiden ([place]) 10/19/2017  . Goals of care, counseling/discussion 10/19/2017  . Cervical cancer, FIGO stage IB1 ([place]) 09/18/2017  . Chronic hepatitis C without hepatic coma ([place]) 09/18/2017  . Cytopenia 09/18/2017  . Diarrhea 09/18/2017  . Lower abdominal pain 09/18/2017  . 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 Malignant neoplasm of overlapping sites of cervix ([place]) 09/18/2017  . Renal insufficiency 09/18/2017  . Wound infection after surgery 09/12/2017  . Hypokalemia   . Hypomagnesemia   . Cervical arthritis 07/18/2017  . Dysuria 06/20/2017  . Metastatic cancer ([place]) 05/12/2017  . Essential hypertension 03/15/2017  . Anemia in other chronic diseases classified elsewhere 03/01/2017  . Chemotherapy-induced neutropenia ([place]) 01/29/2017  . 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[place]) 09/25/2016    Past Medical History: Past Medical History:  Diagnosis Date  . Abdominal pain 06/10/2018  . Abnormal cervical Papanicolaou smear 09/18/2017  . Anxiety   . Aortic  atherosclerosis ([place])   . Arthritis    neck and knees  . 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 Blood transfusion without reported diagnosis   . Cervical cancer ([place]) 09/2016   mets lung  . Chronic anal fissure   . Chronic diarrhea   . Dyspnea   .  Erosive gastropathy 09/18/2017  . Factor V Leiden mutation ([place])   . Fecal incontinence   . Genital warts   . GERD (gastroesophageal reflux disease)   . Heart murmur   . Hematochezia   . Hemorrhoids   . 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 Hepatitis, chronic ([place]) 05/05/2017  . History of cancer chemotherapy    completed 06/2017  . History of Clostridium difficile infection    while undergoing chemo.  Negative test 10/2017  . Ileocolic anastomotic leak   . Infarction of kidney ([place]) left kidney   and uterus  . Intestinal infection due to Clostridium difficile 09/18/2017  . Macrocytic anemia with vitamin B12 deficiency   . Multiple gastric ulcers   . Nausea vomiting and diarrhea   . Pancolitis ([place]) 07/27/2018  . Perianal condylomata   . Pneumonia    History of  . Pulmonary nodules   . Rectal bleeding   . Small bowel obstruction ([place]) 08/2017  . Stiff neck    limited right turn  . Vitamin D deficiency     Past Surgical History: Past Surgical History:  Procedure Laterality Date  . CHOLECYSTECTOMY    . COLON SURGERY  08/2017   resection  . COLONOSCOPY WITH PROPOFOL N/A 12/20/2017   Procedure: COLONOSCOPY WITH PROPOFOL;  Surgeon: [name], MD;  Location: [facility] ENDOSCOPY;  Service: Gastroenterology;  Laterality: N/A;  . COLONOSCOPY WITH PROPOFOL N/A 07/30/2018   Procedure: COLONOSCOPY WITH PROPOFOL;  Surgeon: [name], MD;  Location: [facility] ENDOSCOPY;  Service: Gastroenterology;  Laterality: N/A;  . COLONOSCOPY WITH PROPOFOL N/A 10/10/2018   Procedure: COLONOSCOPY WITH PROPOFOL;  Surgeon: Lucilla Lame, MD;  Location: [facility] ENDOSCOPY;  Service: Endoscopy;  Laterality: N/A;  . DIAGNOSTIC LAPAROSCOPY    . ESOPHAGOGASTRODUODENOSCOPY (EGD) WITH PROPOFOL N/A 12/20/2017   Procedure: ESOPHAGOGASTRODUODENOSCOPY (EGD) WITH PROPOFOL;  Surgeon: [name], MD;  Location: [facility];  Service: Gastroenterology;  Laterality: N/A;  .  ESOPHAGOGASTRODUODENOSCOPY (EGD) WITH PROPOFOL  07/30/2018   Procedure: ESOPHAGOGASTRODUODENOSCOPY (EGD) WITH PROPOFOL;  Surgeon: [name], MD;  Location: [facility] ENDOSCOPY;  Service: Gastroenterology;;  . Otho Darner SIGMOIDOSCOPY N/A 11/21/2018   Procedure: FLEXIBLE SIGMOIDOSCOPY;  Surgeon: [name], MD;  Location: [facility] ENDOSCOPY;  Service: Gastroenterology;  Laterality: N/A;  . LAPAROTOMY N/A 08/31/2017   Procedure: EXPLORATORY LAPAROTOMY for SBO, ileocolectomy, removal of piece of uterine wall;  Surgeon: [name], MD;  Location: [facility] ORS;  Service: General;  Laterality: N/A;  . LASER ABLATION CONDOLAMATA N/A 02/22/2018   Procedure: LASER ABLATION/REMOVAL OF HNGITJLLVDI AROUND ANUS AND VAGINA;  Surgeon: Michael Boston, MD;  Location: [facility];  Service: General;  Laterality: N/A;  . OOPHORECTOMY    . PORTA CATH INSERTION N/A 05/13/2018   Procedure: PORTA CATH INSERTION;  Surgeon: Katha Cabal, MD;  Location: [facility] CV LAB;  Service: Cardiovascular;  Laterality: N/A;  . SMALL INTESTINE SURGERY    . TANDEM RING INSERTION     x3  . THORACOTOMY    . TOTAL KNEE ARTHROPLASTY Left 04/24/2018   Procedure: TOTAL KNEE ARTHROPLASTY;  Surgeon: Lovell Sheehan, MD;  Location: [facility] ORS;  Service: Orthopedics;  Laterality: Left;     Family History: Family History  Problem Relation Age of Onset  . Hypertension Father   . Diabetes Father   . Alcohol abuse Daughter   . Hypertension Maternal Grandmother   . Diabetes Maternal Grandmother   . Diabetes Paternal Grandmother   . Hypertension Paternal Grandmother     Social History: Social History   Socioeconomic History  . Marital status: Divorced    Spouse name: Not on file  . Number of children: Not on file  . Years of education: Not on file  . Highest education level: Not on file  Occupational History  . Not on file  Social Needs  . Financial resource strain: Not hard at all  . Food insecurity     Worry: Never true    Inability: Never true  . Transportation needs    Medical: No    Non-medical: No  Tobacco Use  . Smoking status: Former Smoker    Quit date: 10/16/2006    Years since quitting: 12.4  . Smokeless tobacco: Never Used  Substance and Sexual Activity  . Alcohol use: Not Currently    Frequency: Never    Comment: seldom  . Drug use: Yes    Types: Marijuana  . Sexual activity: Not Currently    Birth control/protection: Post-menopausal    Comment: Not Asked  Lifestyle  . Physical activity    Days per week: Patient refused    Minutes per session: Patient refused  . Stress: Only a little  Relationships  . Social Herbalist on phone: Patient refused    Gets together: Patient refused    Attends religious service: Patient refused    Active member of club or organization: Patient refused    Attends meetings of clubs or organizations: Patient refused    Relationship status: Patient refused  . Intimate partner violence    Fear of current or ex partner: No    Emotionally abused: No    Physically abused: No    Forced sexual activity: No  Other Topics Concern  . Not on file  Social History Narrative  . Not on file    Allergies: Allergies  Allergen Reactions  . Ketamine Anxiety and Other (See Comments)    Syncope episode/confusion     Current Medications: Current Outpatient Medications  Medication Sig Dispense Refill  . amitriptyline (ELAVIL) 75 MG tablet Take 1 tablet (75 mg total) by mouth at bedtime. 90 tablet 1  . budesonide (ENTOCORT EC) 3 MG 24 hr capsule Take 3 capsules (9 mg total) by mouth daily for 30 days. 90 capsule 0  . budesonide (ENTOCORT EC) 3 MG 24 hr capsule Take 3 capsules (9 mg total) by mouth daily for 30 days. 90 capsule 2  . Calcium Carb-Cholecalciferol (CALCIUM 500 +D) 500-400 MG-UNIT TABS Take 2 tablets by mouth daily.    . diphenoxylate-atropine (LOMOTIL) 2.5-0.025 MG tablet TAKE 2 TABLETS BY MOUTH FOUR TIMES A DAY 240  tablet 0  . famotidine (PEPCID) 20 MG tablet Take 1 tablet (20 mg total) by mouth 2 (two) times daily as needed for heartburn or indigestion.    . hyoscyamine (LEVBID) 0.375 MG 12 hr tablet Take 1 tablet (0.375 mg total)by mouth 2 (two) times daily. 60 tablet 0  . meclizine (ANTIVERT) 25 MG tablet Take 1 tablet (25 mg total) by mouth 2 (two) times daily as needed for dizziness. 15  tablet 0  . Multiple Vitamins-Minerals (MULTIVITAMIN WITH MINERALS) tablet Take 1 tablet by mouth daily. 30 tablet 0  . ondansetron (ZOFRAN ODT) 4 MG disintegrating tablet Take 1 tablet (4 mg total) by mouth every 8 (eight) hours as needed for nausea or vomiting. 30 tablet 2  . oxyCODONE (OXYCONTIN) 15 mg 12 hr tablet Take 1 tablet (15 mg total) by mouth every 12 (twelve) hours. 30 tablet 0  . Oxycodone HCl 10 MG TABS Take 1 tablet (10 mg total) by mouth every 6 (six) hours as needed (for breakthrough pain). 90 tablet 0  . pantoprazole (PROTONIX) 40 MG tablet Take 1 tablet (40 mg total) by mouth daily. 90 tablet 1  . potassium chloride SA (K-DUR,KLOR-CON) 20 MEQ tablet Take 1 tablet (20 mEq total) by mouth daily. 30 tablet 3  . promethazine (PHENERGAN) 25 MG tablet Take 1 tablet (25 mg total) by mouth every 8 (eight) hours as needed for nausea or vomiting. 30 tablet 3  . rivaroxaban (XARELTO) 20 MG TABS tablet Take 1 tablet (20 mg total) by mouth daily with supper. 30 tablet 1  . VENTOLIN HFA 108 (90 Base) MCG/ACT inhaler Inhale 1-2 puffs into the lungs every 4 (four) hours as needed for shortness of breath. 1 Inhaler 1  . VIBERZI 100 MG TABS TAKE 1 TABLET BY MOUTH TWICE DAILY WITH A MEAL 60 tablet 0   No current facility-administered medications for this visit.    Facility-Administered Medications Ordered in Other Visits  Medication Dose Route Frequency Provider Last Rate Last Dose  . heparin lock flush 100 unit/mL  500 Units Intravenous Once Corcoran, Melissa C, MD      . heparin lock flush 100 unit/mL  500 Units  Intracatheter Once PRN Charlaine Dalton R, MD      . sodium chloride 0.9 % 1,000 mL with potassium chloride 20 mEq infusion   Intravenous Once Honor Loh E, NP      . sodium chloride flush (NS) 0.9 % injection 10 mL  10 mL Intravenous Once Borders, Kirt Boys, NP       Review of Systems General:  Fatigue, weakness Skin: no complaints Eyes: no complaints HEENT: no complaints Breasts: no complaints Pulmonary: shortness of breath Cardiac: no complaints Gastrointestinal: abdominal bloating, distention, nausea, chronic diarrhea Genitourinary/Sexual: dysuria Ob/Gyn: no complaints Musculoskeletal: chronic back pain Hematology: no complaints Neurologic/Psych: depression   Objective:  Physical Examination:  BP 117/81 (BP Location: Right Leg, Patient Position: Sitting)   Pulse (!) 111   Temp 97.7 F (36.5 C) (Tympanic)   Wt 167 lb 1.6 oz (75.8 kg)   BMI 24.68 kg/m    ECOG Performance Status: 1 - Symptomatic but completely ambulatory  GENERAL: Patient is chronically ill appearing female in no acute distress HEENT:  Sclera clear. Anicteric NODES:  Negative axillary, supraclavicular, inguinal lymph node survery LUNGS:  Clear to auscultation bilaterally.   HEART:  Regular rate and rhythm.  ABDOMEN:  Soft, nontender. No masses or ascites EXTREMITIES:  No peripheral edema. Atraumatic. No cyanosis SKIN:  Clear with no obvious rashes or skin changes.  NEURO:  Nonfocal. Well oriented.  Appropriate affect.  Pelvic: exam chaperoned by nurse; Vulva normal, Vagina well healed and somewhat shortened. Narrow speculum tolerated well.  Atrophic, mucosa pale, no lesions, but spots easily. Cervix: flush with vault, no lesions. Uterus: not palpable. Bimanual/RV:normal  No masses or nodularity.     Assessment:  Joanna Hall is a 53 y.o. female diagnosed with recurrent stage IB1 cervical adenocarcinoma (  PDL1 positive) s/p aborted radical hysterectomy due to infected adnexa 2/18 at [facility] followed  by chemo-radiation at [facility] with apparent complete response.  She developed a bowel obstruction and had resection of terminal ileum and cecum 11/18 at [facility].  Imaging from 04/2018 revealed lung metastases. She initiated carboplatin-taxol on 06/04/18. Course complicated by chronic diarrhea and multiple GI issues. Now on single agent weekly taxol and tolerating well.  CT scan in 4/20 showed stable lung metastases.  No evidence of pelvic disease on exam today.  Medical co-morbidities complicating care:untreated Hepatitis C .  Plan:   Problem List Items Addressed This Visit      Genitourinary   Malignant neoplasm of overlapping sites of cervix ([place]) - Primary      Continue weekly taxol chemotherapy with Dr Rogue Bussing. Bevacizumab not given as patient is on Xarelto for factor V Leiden and renal infarct.     PDL1 positive disease; she would be a candidate for Keytruda in the future if she progresses.    We can see her back in 3-4 months for follow up or sooner if needed.  Beckey Rutter, DNP, AGNP-C [place] at [facility] [phone] (work cell) [phone] (office)  I personally interviewed and examined the patient. Agreed with the above/below plan of care.  Patient/family questions were answered.  Mellody Drown, MD   CC: Dr. Rogue Bussing, [facility]

## 2019-04-02 NOTE — Telephone Encounter (Signed)
Spoke to pt and completed travel screen. Also explained about addl screening questions they will be asked, new guidelines about mask req, no visitors, and fever checks

## 2019-04-03 ENCOUNTER — Other Ambulatory Visit: Payer: Self-pay

## 2019-04-03 ENCOUNTER — Inpatient Hospital Stay: Payer: Medicaid Other

## 2019-04-03 ENCOUNTER — Inpatient Hospital Stay (HOSPITAL_BASED_OUTPATIENT_CLINIC_OR_DEPARTMENT_OTHER): Payer: Medicaid Other | Admitting: Internal Medicine

## 2019-04-03 VITALS — HR 85

## 2019-04-03 VITALS — BP 133/84 | HR 111 | Temp 97.2°F | Resp 20 | Wt 166.4 lb

## 2019-04-03 DIAGNOSIS — Z95828 Presence of other vascular implants and grafts: Secondary | ICD-10-CM

## 2019-04-03 DIAGNOSIS — C538 Malignant neoplasm of overlapping sites of cervix uteri: Secondary | ICD-10-CM

## 2019-04-03 DIAGNOSIS — Z7189 Other specified counseling: Secondary | ICD-10-CM

## 2019-04-03 DIAGNOSIS — C530 Malignant neoplasm of endocervix: Secondary | ICD-10-CM

## 2019-04-03 DIAGNOSIS — Z5111 Encounter for antineoplastic chemotherapy: Secondary | ICD-10-CM | POA: Diagnosis not present

## 2019-04-03 LAB — COMPREHENSIVE METABOLIC PANEL
ALT: 134 U/L — ABNORMAL HIGH (ref 0–44)
AST: 69 U/L — ABNORMAL HIGH (ref 15–41)
Albumin: 4.1 g/dL (ref 3.5–5.0)
Alkaline Phosphatase: 221 U/L — ABNORMAL HIGH (ref 38–126)
Anion gap: 11 (ref 5–15)
BUN: 13 mg/dL (ref 6–20)
CO2: 24 mmol/L (ref 22–32)
Calcium: 9.3 mg/dL (ref 8.9–10.3)
Chloride: 102 mmol/L (ref 98–111)
Creatinine, Ser: 0.7 mg/dL (ref 0.44–1.00)
GFR calc Af Amer: >60 mL/min (ref >60)
GFR calc non Af Amer: >60 mL/min (ref >60)
Glucose, Bld: 130 mg/dL — ABNORMAL HIGH (ref 70–99)
Potassium: 3.8 mmol/L (ref 3.5–5.1)
Sodium: 137 mmol/L (ref 135–145)
Total Bilirubin: 0.5 mg/dL (ref 0.3–1.2)
Total Protein: 7.8 g/dL (ref 6.5–8.1)

## 2019-04-03 LAB — CBC WITH DIFFERENTIAL/PLATELET
Abs Immature Granulocytes: 0.01 10*3/uL (ref 0.00–0.07)
Basophils Absolute: 0 10*3/uL (ref 0.0–0.1)
Basophils Relative: 1 %
Eosinophils Absolute: 0 10*3/uL (ref 0.0–0.5)
Eosinophils Relative: 1 %
HCT: 30.8 % — ABNORMAL LOW (ref 36.0–46.0)
Hemoglobin: 10.3 g/dL — ABNORMAL LOW (ref 12.0–15.0)
Immature Granulocytes: 1 %
Lymphocytes Relative: 24 %
Lymphs Abs: 0.4 10*3/uL — ABNORMAL LOW (ref 0.7–4.0)
MCH: 35.3 pg — ABNORMAL HIGH (ref 26.0–34.0)
MCHC: 33.4 g/dL (ref 30.0–36.0)
MCV: 105.5 fL — ABNORMAL HIGH (ref 80.0–100.0)
Monocytes Absolute: 0.2 10*3/uL (ref 0.1–1.0)
Monocytes Relative: 9 %
Neutro Abs: 1.2 10*3/uL — ABNORMAL LOW (ref 1.7–7.7)
Neutrophils Relative %: 64 %
Platelets: 153 10*3/uL (ref 150–400)
RBC: 2.92 MIL/uL — ABNORMAL LOW (ref 3.87–5.11)
RDW: 13.9 % (ref 11.5–15.5)
WBC: 1.9 10*3/uL — ABNORMAL LOW (ref 4.0–10.5)
nRBC: 0 % (ref 0.0–0.2)

## 2019-04-03 LAB — MAGNESIUM: Magnesium: 1.6 mg/dL — ABNORMAL LOW (ref 1.7–2.4)

## 2019-04-03 MED ORDER — SODIUM CHLORIDE 0.9 % IV SOLN
Freq: Once | INTRAVENOUS | Status: AC
  Administered 2019-04-03: 11:00:00 via INTRAVENOUS
  Filled 2019-04-03: qty 250

## 2019-04-03 MED ORDER — FAMOTIDINE IN NACL 20-0.9 MG/50ML-% IV SOLN
20.0000 mg | Freq: Once | INTRAVENOUS | Status: AC
  Administered 2019-04-03: 20 mg via INTRAVENOUS
  Filled 2019-04-03: qty 50

## 2019-04-03 MED ORDER — HEPARIN SOD (PORK) LOCK FLUSH 100 UNIT/ML IV SOLN
500.0000 [IU] | Freq: Once | INTRAVENOUS | Status: AC | PRN
  Administered 2019-04-03: 500 [IU]
  Filled 2019-04-03: qty 5

## 2019-04-03 MED ORDER — DEXAMETHASONE SODIUM PHOSPHATE 10 MG/ML IJ SOLN
10.0000 mg | Freq: Once | INTRAMUSCULAR | Status: AC
  Administered 2019-04-03: 10 mg via INTRAVENOUS
  Filled 2019-04-03: qty 1

## 2019-04-03 MED ORDER — DIPHENHYDRAMINE HCL 50 MG/ML IJ SOLN
50.0000 mg | Freq: Once | INTRAMUSCULAR | Status: AC
  Administered 2019-04-03: 50 mg via INTRAVENOUS
  Filled 2019-04-03: qty 1

## 2019-04-03 MED ORDER — SODIUM CHLORIDE 0.9% FLUSH
10.0000 mL | Freq: Once | INTRAVENOUS | Status: AC
  Administered 2019-04-03: 10 mL via INTRAVENOUS
  Filled 2019-04-03: qty 10

## 2019-04-03 MED ORDER — SODIUM CHLORIDE 0.9 % IV SOLN
65.0000 mg/m2 | Freq: Once | INTRAVENOUS | Status: AC
  Administered 2019-04-03: 126 mg via INTRAVENOUS
  Filled 2019-04-03: qty 21

## 2019-04-03 NOTE — Assessment & Plan Note (Addendum)
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  CT abdomen pelvis-April 2020 -no evidence of  progressive disease in the abdomen pelvis stable bowel thickening; lung nodule -stable.  Currently on single agent Taxol 3 w-On and 1 week Off.  #Proceed with Taxol today. Labs today reviewed;  acceptable for treatment today. ANC 1.2; we will plan to get CT scan prior to next cycle in 3 weeks.  #Peripheral neuropathy stable.  If worse recommend discontinuation of Taxol/start Keytruda.  #Hypomagnesemia-secondary platinum chemotherapy-mag 1.6; hold IV mag today.  Stable  #Chronic right lower quadrant abdominal pain/scar tissue versus others-on oxycodone stable.  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  # 1 week- labs-cbc/ bmp/mag-Taxol--Mag 4 gm/2h possible IVFs over 1 hour-  # follow up in 3 weeks-MD-Taxol /cbc/cmp/mag; Mag 4 gm/2 hour; CT C/A/P prior Dr.B

## 2019-04-03 NOTE — Progress Notes (Signed)
[place] NOTE  Patient Care Team: Lada, Satira Anis, MD as PCP - General (Family Medicine) Mellody Drown, MD as Consulting Physician (Obstetrics and Gynecology) Lenor Coffin, MD as Attending Physician (Gynecology) Lequita Asal, MD as Medical Oncologist (Hematology and Oncology) [name], MD as Consulting Physician (Gastroenterology) Michael Boston, MD as Consulting Physician (General Surgery) Lovell Sheehan, MD as Consulting Physician (Orthopedic Surgery) Clent Jacks, RN as Registered Nurse  CHIEF COMPLAINTS/PURPOSE OF CONSULTATION: Cervical cancer  #  Oncology History Overview Note  # dec 2017- CERVICAL ADENO CA [[place]]; Stage IB [Dr. Christene Slates at [facility] in [place], [place];No surgery- Chemo-RT;  # 2018-sep - lung nodules [in [place]]  # AUG 20th, 2019-#1 Carbo-Taxol s/p 6 cycles- [No avastin sec to blood clots]: Feb 2020-bilateral lung nodules with 1 cm in size. March 2020- finished carbo-taxol #7; poor tolerance to [place].  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  She received treatment from 11/2016-05/2017.  01/2017 cisplatin x 2 and carboplatin x 1 (01/29/2017) due to ARF and XRT.  XRT was followed by T & O on 02/01/2017 and T & N 02/10/2017 and 02/20/2017.  Course was complicated by 80 pound weight loss, nausea, vomiting, electrolyte wasting (potassium and magnesium).  She describes that.  Is been sick constantly requiring at least 20 hospitalizations.  Follow-up CT chest and  PET on 06/2017. Per patient, 'radiation worked' and no disease in the abdomen.  At that time she was noted to have lung nodules that were growing and follow-up imaging was scheduled for 10/2017.  She was admitted to hospital in [place] for small bowel obstruction which was managed conservatively and she was home for a week prior to traveling to [place] for Thanksgiving holiday where she has family.  She presented to ER in [place] on 08/2017 with nausea, vomiting, and lower abdominal pain.  Symptoms did not respond to conservative treatment.  CT on 08/26/2017 revealed small bowel obstruction with transition in the pelvis just superior to the uterus rather was a long segment of distal ileum with fatty wall thickening compatible with chronic inflammation and/or radiation enteritis. Imaging showed numerous pulmonary nodules consistent with metastatic disease. She underwent laparotomy and right ileocolectomy on 08/31/2017 at [facility].  Surgical findings revealed a thickened, matted, and scarred piece of distal small bowel close to the ileocecal valve.  She was discharged on 09/05/2017.  Pain markedly increased in intensity and imaging was performed on 09/11/2017 which revealed: Debris within the anterior abdominal wall incision concerning for infection versus packing material, s/p post ileo-colectomy with expected postoperative changes, mild colonic ileus, numerous pulmonary nodules highly concerning for metastatic disease, punctate nonobstructing nephrolithiasis.  Staples were removed and one was packed.  She was started on doxycycline.  Abdominal and pelvic CT without contrast on 09/11/2017 revealed debris within anterior abdominal wall incision concerning for infection, versus packing material.She is s/pileocolectomy with expected postoperative changes and mild colonic ileus. There were numerous pulmonary nodules highly concerning for metastatic diseaseand punctate nonobstructing  nephrolithiasis. She was readmitted on 09/12/2017. She describes the onset of lower  abdominal pain on 09/09/2017. Pain markedly increased in intensity on 09/11/2017.  Staples were removed and the wound packed. She was started on doxycycline.  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  PET scan was ordered to evaluate enlarging lung nodules with concern for recurrent cervical cancer but scan was delayed due to insurance and need to be performed in [place].  Presented to ER on 12/03/2017 for abdominal pain and emesis.  Imaging concerning for worsening possible uterine infarct and she was admitted to hospital.  Pelvic MRI was unremarkable.  Remote scarring type changes of uterus thought to be possibly related to radiation.  She was discharged on 12/08/2017.  Underwent endoscopy and colonoscopy on 12/20/2017.    On 02/22/2018 she underwent laser ablation of condylomata around the anus and vagina under anesthesia with Dr. Johney Maine.   04/15/2018: Chest, abdomen, and pelvis CTrevealed innumerable (>100) cavitary nodules scattered in the lungs, moderately enlarging compared to the 11/08/2017 PET-CT, suspicious for metastatic disease. One index node in the RLL measures 1.0 x 1.1 cm (previously 0.6 x 0.6 cm). There were no new nodules. There was an ill-defined wall thickening in the rectosigmoid with surrounding stranding along fascia planes, probably sequela from prior radiation therapy. There was multilevel lumbar impingement due to spondylosis and degenerative disc disease. There was heterogeneous enhancement in the  uterus (some possibly from prior radiation therapy).  04/23/2018:PET scan revealednumerous scattered solid and cavitary nodules in the lungs stable increased in size compared to the prior PET-CT from 11/08/2017.Largest nodule was 1.1 cm in the LUL (SUV 1.9).These demonstratedlow-grade metabolic activity up to a maximum SUV of about 2.3, increased from 11/08/2017.   Case was discussed at tumor board on 04/25/2018. Consensus to pursue CT-guided biopsy (05/06/18) which revealed: Metastatic adenocarcinoma, morphologically consistent with cervical adenocarcinoma.  She has history of chronic hepatitis C which is managed by GI.  Hepatitis C genotype is 2a/2c.  She receives B12 injections for history of B12 deficiency.  On 04/24/2018 she underwent left total knee replacement with Dr. [name]. --------------------------------------------------------   DIAGNOSIS: Cervical adenocarcinoma  STAGE: IV        ;GOALS: Palliative  CURRENT/MOST RECENT THERAPY : Taxol single agent    Malignant neoplasm of overlapping sites of cervix ([place])  01/29/2019 -  Chemotherapy   The patient had PACLitaxel (TAXOL) 156 mg in sodium chloride 0.9 % 250 mL chemo infusion (</= 29m/m2), 80 mg/m2 = 156 mg, Intravenous,  Once, 3 of 4 cycles Dose modification: 65 mg/m2 (original dose 80 mg/m2, Cycle 1, Reason: Provider Judgment) Administration: 156 mg (01/29/2019), 156 mg (02/05/2019), 126 mg (02/12/2019), 126 mg (02/26/2019), 126 mg (03/13/2019), 126 mg (03/27/2019), 126 mg (03/20/2019), 126 mg (04/03/2019)  for chemotherapy treatment.       HISTORY OF PRESENTING ILLNESS:  Joanna GILCREST89y.o.  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  Patient was recently evaluated by gynecology oncology.   Patient continues to have intermittent abdominal pain nausea vomiting diarrhea for which she continues to need IV fluids/pain medications and supportive  care.  Today she feels improved.  Denies any tingling or  numbness.  Chronic joint pains chronic abdominal pain diarrhea.  No vaginal bleeding or acute bleeding.   Review of Systems  Constitutional: Positive for malaise/fatigue. Negative for chills, diaphoresis, fever and weight loss.  HENT: Negative for nosebleeds and sore throat.   Eyes: Negative for double vision.  Respiratory: Negative for cough, hemoptysis, sputum production, shortness of breath and wheezing.   Cardiovascular: Negative for chest pain, palpitations, orthopnea and leg swelling.  Gastrointestinal: Positive for abdominal pain and diarrhea. Negative for blood in stool, constipation, heartburn, melena, nausea and vomiting.  Genitourinary: Negative for dysuria, frequency and urgency.  Musculoskeletal: Negative for back pain and joint pain.  Skin: Negative.  Negative for itching and rash.  Neurological: Positive for tingling. Negative for dizziness, focal weakness, weakness and headaches.  Endo/Heme/Allergies: Does not bruise/bleed easily.  Psychiatric/Behavioral: Negative for depression. The patient is not nervous/anxious and does not have insomnia.      MEDICAL HISTORY:  Past Medical History:  Diagnosis Date  . Abdominal pain 06/10/2018  . Abnormal cervical Papanicolaou smear 09/18/2017  . Anxiety   . Aortic atherosclerosis ([place])   . Arthritis    neck and knees  . 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 Blood transfusion without reported diagnosis   . Cervical cancer ([place]) 09/2016   mets lung  . Chronic anal fissure   . Chronic diarrhea   . Dyspnea   . Erosive gastropathy 09/18/2017  . Factor V Leiden mutation ([place])   . Fecal incontinence   . Genital warts   . GERD (gastroesophageal reflux disease)   . Heart murmur   . Hematochezia   . Hemorrhoids   . 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 Hepatitis, chronic ([place]) 05/05/2017  . History of cancer chemotherapy     completed 06/2017  . History of Clostridium difficile infection    while undergoing chemo.  Negative test 10/2017  . Ileocolic anastomotic leak   . 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 Intestinal infection due to Clostridium difficile 09/18/2017  . Macrocytic anemia with vitamin B12 deficiency   . Multiple gastric ulcers   . Nausea vomiting and diarrhea   . Pancolitis ([place]) 07/27/2018  . Perianal condylomata   . Pneumonia    History of  . Pulmonary nodules   . Rectal bleeding   . Small bowel obstruction ([place]) 08/2017  . Stiff neck    limited right turn  . Vitamin D deficiency     SURGICAL HISTORY: Past Surgical History:  Procedure Laterality Date  . CHOLECYSTECTOMY    . COLON SURGERY  08/2017   resection  . COLONOSCOPY WITH PROPOFOL N/A 12/20/2017   Procedure: COLONOSCOPY WITH PROPOFOL;  Surgeon: [name], MD;  Location[facility] ENDOSCOPY;  Service: Gastroenterology;  Laterality: N/A;  . COLONOSCOPY WITH PROPOFOL N/A 07/30/2018   Procedure: COLONOSCOPY WITH PROPOFOL;  Surgeon: [name], MD;  Location: [facility] ENDOSCOPY;  Service: Gastroenterology;  Laterality: N/A;  . COLONOSCOPY WITH PROPOFOL N/A 10/10/2018   Procedure: COLONOSCOPY WITH PROPOFOL;  Surgeon: Lucilla Lame, MD;  Location: [facility] ENDOSCOPY;  Service: Endoscopy;  Laterality: N/A;  . DIAGNOSTIC LAPAROSCOPY    . ESOPHAGOGASTRODUODENOSCOPY (EGD) WITH PROPOFOL N/A 12/20/2017   Procedure: ESOPHAGOGASTRODUODENOSCOPY (EGD) WITH PROPOFOL;  Surgeon: [name], MD;  Location: [place];  Service: Gastroenterology;  Laterality: N/A;  . ESOPHAGOGASTRODUODENOSCOPY (EGD) WITH PROPOFOL  07/30/2018   Procedure: ESOPHAGOGASTRODUODENOSCOPY (  EGD) WITH PROPOFOL;  Surgeon: [name], MD;  Location[facility] ENDOSCOPY;  Service: Gastroenterology;;  . Otho Darner SIGMOIDOSCOPY N/A 11/21/2018   Procedure: FLEXIBLE SIGMOIDOSCOPY;  Surgeon: [name], MD;  Location: [facility] ENDOSCOPY;   Service: Gastroenterology;  Laterality: N/A;  . LAPAROTOMY N/A 08/31/2017   Procedure: EXPLORATORY LAPAROTOMY for SBO, ileocolectomy, removal of piece of uterine wall;  Surgeon: [name], MD;  Location: [facility] ORS;  Service: General;  Laterality: N/A;  . LASER ABLATION CONDOLAMATA N/A 02/22/2018   Procedure: LASER ABLATION/REMOVAL OF HYQMVHQIONG AROUND ANUS AND VAGINA;  Surgeon: Michael Boston, MD;  Location: [place];  Service: General;  Laterality: N/A;  . OOPHORECTOMY    . PORTA CATH INSERTION N/A 05/13/2018   Procedure: PORTA CATH INSERTION;  Surgeon: Katha Cabal, MD;  Location: [place] CV LAB;  Service: Cardiovascular;  Laterality: N/A;  . SMALL INTESTINE SURGERY    . TANDEM RING INSERTION     x3  . THORACOTOMY    . TOTAL KNEE ARTHROPLASTY Left 04/24/2018   Procedure: TOTAL KNEE ARTHROPLASTY;  Surgeon: Lovell Sheehan, MD;  Location: [facility] ORS;  Service: Orthopedics;  Laterality: Left;    SOCIAL HISTORY: Social History   Socioeconomic History  . Marital status: Divorced    Spouse name: Not on file  . Number of children: Not on file  . Years of education: Not on file  . Highest education level: Not on file  Occupational History  . Not on file  Social Needs  . Financial resource strain: Not hard at all  . Food insecurity    Worry: Never true    Inability: Never true  . Transportation needs    Medical: No    Non-medical: No  Tobacco Use  . Smoking status: Former Smoker    Quit date: 10/16/2006    Years since quitting: 12.4  . Smokeless tobacco: Never Used  Substance and Sexual Activity  . Alcohol use: Not Currently    Frequency: Never    Comment: seldom  . Drug use: Yes    Types: Marijuana  . Sexual activity: Not Currently    Birth control/protection: Post-menopausal    Comment: Not Asked  Lifestyle  . Physical activity    Days per week: Patient refused    Minutes per session: Patient refused  . Stress: Only a little  Relationships   . Social Herbalist on phone: Patient refused    Gets together: Patient refused    Attends religious service: Patient refused    Active member of club or organization: Patient refused    Attends meetings of clubs or organizations: Patient refused    Relationship status: Patient refused  . Intimate partner violence    Fear of current or ex partner: No    Emotionally abused: No    Physically abused: No    Forced sexual activity: No  Other Topics Concern  . Not on file  Social History Narrative  . Not on file    FAMILY HISTORY: Family History  Problem Relation Age of Onset  . Hypertension Father   . Diabetes Father   . Alcohol abuse Daughter   . Hypertension Maternal Grandmother   . Diabetes Maternal Grandmother   . Diabetes Paternal Grandmother   . Hypertension Paternal Grandmother     ALLERGIES:  is allergic to ketamine.  MEDICATIONS:  Current Outpatient Medications  Medication Sig Dispense Refill  . amitriptyline (ELAVIL) 75 MG tablet Take 1 tablet (75 mg total)  by mouth at bedtime. 90 tablet 1  . budesonide (ENTOCORT EC) 3 MG 24 hr capsule Take 3 capsules (9 mg total) by mouth daily for 30 days. 90 capsule 2  . Calcium Carb-Cholecalciferol (CALCIUM 500 +D) 500-400 MG-UNIT TABS Take 2 tablets by mouth daily.    . diphenoxylate-atropine (LOMOTIL) 2.5-0.025 MG tablet TAKE 2 TABLETS BY MOUTH FOUR TIMES A DAY 240 tablet 0  . famotidine (PEPCID) 20 MG tablet Take 1 tablet (20 mg total) by mouth 2 (two) times daily as needed for heartburn or indigestion.    . hyoscyamine (LEVBID) 0.375 MG 12 hr tablet Take 1 tablet (0.375 mg total)by mouth 2 (two) times daily. 60 tablet 0  . meclizine (ANTIVERT) 25 MG tablet Take 1 tablet (25 mg total) by mouth 2 (two) times daily as needed for dizziness. 15 tablet 0  . Multiple Vitamins-Minerals (MULTIVITAMIN WITH MINERALS) tablet Take 1 tablet by mouth daily. 30 tablet 0  . ondansetron (ZOFRAN ODT) 4 MG disintegrating tablet Take 1  tablet (4 mg total) by mouth every 8 (eight) hours as needed for nausea or vomiting. 30 tablet 2  . oxyCODONE (OXYCONTIN) 15 mg 12 hr tablet Take 1 tablet (15 mg total) by mouth every 12 (twelve) hours. 30 tablet 0  . Oxycodone HCl 10 MG TABS Take 1 tablet (10 mg total) by mouth every 6 (six) hours as needed (for breakthrough pain). 90 tablet 0  . pantoprazole (PROTONIX) 40 MG tablet Take 1 tablet (40 mg total) by mouth daily. 90 tablet 1  . potassium chloride SA (K-DUR,KLOR-CON) 20 MEQ tablet Take 1 tablet (20 mEq total) by mouth daily. (Patient taking differently: Take 20 mEq by mouth 2 (two) times daily. ) 30 tablet 3  . promethazine (PHENERGAN) 25 MG tablet Take 1 tablet (25 mg total) by mouth every 8 (eight) hours as needed for nausea or vomiting. 30 tablet 3  . rivaroxaban (XARELTO) 20 MG TABS tablet Take 1 tablet (20 mg total) by mouth daily with supper. 30 tablet 1  . VENTOLIN HFA 108 (90 Base) MCG/ACT inhaler Inhale 1-2 puffs into the lungs every 4 (four) hours as needed for shortness of breath. 1 Inhaler 1  . VIBERZI 100 MG TABS TAKE 1 TABLET BY MOUTH TWICE DAILY WITH A MEAL 60 tablet 0   No current facility-administered medications for this visit.    Facility-Administered Medications Ordered in Other Visits  Medication Dose Route Frequency Provider Last Rate Last Dose  . heparin lock flush 100 unit/mL  500 Units Intravenous Once Corcoran, Melissa C, MD      . heparin lock flush 100 unit/mL  500 Units Intracatheter Once PRN Charlaine Dalton R, MD      . sodium chloride 0.9 % 1,000 mL with potassium chloride 20 mEq infusion   Intravenous Once Honor Loh E, NP      . sodium chloride flush (NS) 0.9 % injection 10 mL  10 mL Intravenous Once Borders, Kirt Boys, NP          .  PHYSICAL EXAMINATION: ECOG PERFORMANCE STATUS: 1 - Symptomatic but completely ambulatory  Vitals:   04/03/19 0941  BP: 133/84  Pulse: (!) 111  Resp: 20  Temp: (!) 97.2 F (36.2 C)   Filed Weights    04/03/19 0941  Weight: 166 lb 6.4 oz (75.5 kg)    Physical Exam  Constitutional: She is oriented to person, place, and time and well-developed, well-nourished, and in no distress.  Alone.  Walking herself.  HENT:  Head: Normocephalic and atraumatic.  Mouth/Throat: Oropharynx is clear and moist. No oropharyngeal exudate.  Eyes: Pupils are equal, round, and reactive to light.  Neck: Normal range of motion. Neck supple.  Cardiovascular: Normal rate and regular rhythm.  Pulmonary/Chest: Effort normal and breath sounds normal. No respiratory distress. She has no wheezes.  Abdominal: Soft. Bowel sounds are normal. She exhibits no distension and no mass. There is no rebound and no guarding.  Musculoskeletal: Normal range of motion.        General: No tenderness or edema.  Neurological: She is alert and oriented to person, place, and time.  Skin: Skin is warm.  Psychiatric: Affect normal.     LABORATORY DATA:  I have reviewed the data as listed Lab Results  Component Value Date   WBC 1.9 (L) 04/03/2019   HGB 10.3 (L) 04/03/2019   HCT 30.8 (L) 04/03/2019   MCV 105.5 (H) 04/03/2019   PLT 153 04/03/2019   Recent Labs    06/11/18 0510  07/08/18 1035  10/21/18 0845  03/27/19 0912 04/01/19 1139 04/03/19 0913  NA 135   < > 136   < > 134*   < > 138 135 137  K 3.9   < > 4.2   < > 3.9   < > 3.2* 3.8 3.8  CL 102   < > 107   < > 98   < > 105 103 102  CO2 27   < > 22   < > 24   < > 28 22 24   GLUCOSE 82   < > 119*   < > 111*   < > 100* 102* 130*  BUN 11   < > 15   < > 17   < > 13 11 13   CREATININE 0.63   < > 0.71   < > 0.91   < > 0.75 0.67 0.70  CALCIUM 8.9   < > 8.7*   < > 9.6   < > 8.7* 9.3 9.3  GFRNONAA >60   < > >60   < > >60   < > >60 >60 >60  GFRAA >60   < > >60   < > >60   < > >60 >60 >60  PROT 7.0   < > 6.5   < > 8.6*   < > 6.5 7.7 7.8  ALBUMIN 3.5   < > 3.4*   < > 4.4   < > 3.4* 4.0 4.1  AST 41   < > 26   < > 48*   < > 27 29 69*  ALT 51*   < > 33   < > 67*   < > 38 42 134*   ALKPHOS 171*   < > 132*   < > 185*   < > 127* 131* 221*  BILITOT 0.7   < > 0.4   < > 1.4*   < > 0.3 0.5 0.5  BILIDIR 0.1  --  <0.1  --  0.3*  --   --   --   --   IBILI 0.6  --  NOT CALCULATED  --   --   --   --   --   --    < > = values in this interval not displayed.    RADIOGRAPHIC STUDIES: I have personally reviewed the radiological images as listed and agreed with the findings in the report. Dg Abd 1 View  Result Date: 03/12/2019 CLINICAL DATA:  Right flank pain EXAM: ABDOMEN - 1 VIEW COMPARISON:  CT abdomen pelvis 01/23/2019 FINDINGS: Lumbar scoliosis and multilevel disc degeneration. No acute skeletal abnormality. Surgical clips in the gallbladder fossa. Surgical staples in the right abdomen. Normal bowel gas pattern.Negative for urinary tract calculi. IMPRESSION: No acute abnormality. Lumbar spine scoliosis and degenerative change. Electronically Signed   By: Franchot Gallo M.D.   On: 03/12/2019 13:38    ASSESSMENT & PLAN:   Malignant neoplasm of overlapping sites of cervix [facility]) #Cervical adenocarcinoma with metastasis to the lung-currently on carbotaxol.  CT abdomen pelvis-April 2020 -no evidence of  progressive disease in the abdomen pelvis stable bowel thickening; lung nodule -stable.  Currently on single agent Taxol 3 w-On and 1 week Off.  #Proceed with Taxol today. Labs today reviewed;  acceptable for treatment today. ANC 1.2; we will plan to get CT scan prior to next cycle in 3 weeks.  #Peripheral neuropathy stable.  If worse recommend discontinuation of Taxol/start Keytruda.  #Hypomagnesemia-secondary platinum chemotherapy-mag 1.6; hold IV mag today.  Stable  #Chronic right lower quadrant abdominal pain/scar tissue versus others-on oxycodone stable.  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  # 1  week- labs-cbc/ bmp/mag-Taxol--Mag 4 gm/2h possible IVFs over 1 hour-  # follow up in 3 weeks-MD-Taxol /cbc/cmp/mag; Mag 4 gm/2 hour; CT C/A/P prior Dr.B   All questions were answered. The patient knows to call the clinic with any problems, questions or concerns.     Cammie Sickle, MD 04/03/2019 7:13 PM

## 2019-04-03 NOTE — Progress Notes (Signed)
Patient was seen 2 days ago by [facility] with N/V/D and received IVF and IV mag.  Is feeling better today but states that about 3 days after treatments she starts having symptoms again and would like to discuss getting IVF scheduled for next week.

## 2019-04-09 ENCOUNTER — Other Ambulatory Visit: Payer: Self-pay

## 2019-04-10 ENCOUNTER — Other Ambulatory Visit: Payer: Self-pay | Admitting: Oncology

## 2019-04-10 ENCOUNTER — Telehealth: Payer: Self-pay | Admitting: Oncology

## 2019-04-10 ENCOUNTER — Inpatient Hospital Stay: Payer: Medicaid Other

## 2019-04-10 ENCOUNTER — Other Ambulatory Visit: Payer: Self-pay

## 2019-04-10 ENCOUNTER — Inpatient Hospital Stay (HOSPITAL_BASED_OUTPATIENT_CLINIC_OR_DEPARTMENT_OTHER): Payer: Medicaid Other | Admitting: Oncology

## 2019-04-10 ENCOUNTER — Other Ambulatory Visit: Payer: Self-pay | Admitting: Internal Medicine

## 2019-04-10 ENCOUNTER — Other Ambulatory Visit: Payer: Self-pay | Admitting: *Deleted

## 2019-04-10 ENCOUNTER — Encounter: Payer: Self-pay | Admitting: Oncology

## 2019-04-10 VITALS — BP 129/77 | HR 86 | Resp 20

## 2019-04-10 DIAGNOSIS — C530 Malignant neoplasm of endocervix: Secondary | ICD-10-CM

## 2019-04-10 DIAGNOSIS — R10A0 Flank pain, unspecified side: Secondary | ICD-10-CM

## 2019-04-10 DIAGNOSIS — Z95828 Presence of other vascular implants and grafts: Secondary | ICD-10-CM

## 2019-04-10 DIAGNOSIS — C538 Malignant neoplasm of overlapping sites of cervix uteri: Secondary | ICD-10-CM

## 2019-04-10 DIAGNOSIS — G893 Neoplasm related pain (acute) (chronic): Secondary | ICD-10-CM

## 2019-04-10 DIAGNOSIS — Z7189 Other specified counseling: Secondary | ICD-10-CM

## 2019-04-10 DIAGNOSIS — R197 Diarrhea, unspecified: Secondary | ICD-10-CM

## 2019-04-10 DIAGNOSIS — R1030 Lower abdominal pain, unspecified: Secondary | ICD-10-CM

## 2019-04-10 DIAGNOSIS — R109 Unspecified abdominal pain: Secondary | ICD-10-CM

## 2019-04-10 DIAGNOSIS — Z5111 Encounter for antineoplastic chemotherapy: Secondary | ICD-10-CM | POA: Diagnosis not present

## 2019-04-10 DIAGNOSIS — D649 Anemia, unspecified: Secondary | ICD-10-CM

## 2019-04-10 LAB — CBC WITH DIFFERENTIAL/PLATELET
Abs Immature Granulocytes: 0.02 10*3/uL (ref 0.00–0.07)
Basophils Absolute: 0 10*3/uL (ref 0.0–0.1)
Basophils Relative: 0 %
Eosinophils Absolute: 0 10*3/uL (ref 0.0–0.5)
Eosinophils Relative: 1 %
HCT: 27 % — ABNORMAL LOW (ref 36.0–46.0)
Hemoglobin: 8.9 g/dL — ABNORMAL LOW (ref 12.0–15.0)
Immature Granulocytes: 1 %
Lymphocytes Relative: 20 %
Lymphs Abs: 0.6 10*3/uL — ABNORMAL LOW (ref 0.7–4.0)
MCH: 35.2 pg — ABNORMAL HIGH (ref 26.0–34.0)
MCHC: 33 g/dL (ref 30.0–36.0)
MCV: 106.7 fL — ABNORMAL HIGH (ref 80.0–100.0)
Monocytes Absolute: 0.2 10*3/uL (ref 0.1–1.0)
Monocytes Relative: 6 %
Neutro Abs: 2.1 10*3/uL (ref 1.7–7.7)
Neutrophils Relative %: 72 %
Platelets: 155 10*3/uL (ref 150–400)
RBC: 2.53 MIL/uL — ABNORMAL LOW (ref 3.87–5.11)
RDW: 14.2 % (ref 11.5–15.5)
WBC: 2.9 10*3/uL — ABNORMAL LOW (ref 4.0–10.5)
nRBC: 0 % (ref 0.0–0.2)

## 2019-04-10 LAB — URINALYSIS, COMPLETE (UACMP) WITH MICROSCOPIC
Bacteria, UA: NONE SEEN
Bilirubin Urine: NEGATIVE
Glucose, UA: NEGATIVE mg/dL
Hgb urine dipstick: NEGATIVE
Ketones, ur: NEGATIVE mg/dL
Leukocytes,Ua: NEGATIVE
Nitrite: NEGATIVE
Protein, ur: NEGATIVE mg/dL
Specific Gravity, Urine: 1.028 (ref 1.005–1.030)
pH: 5 (ref 5.0–8.0)

## 2019-04-10 LAB — BASIC METABOLIC PANEL
Anion gap: 8 (ref 5–15)
BUN: 10 mg/dL (ref 6–20)
CO2: 23 mmol/L (ref 22–32)
Calcium: 8.6 mg/dL — ABNORMAL LOW (ref 8.9–10.3)
Chloride: 107 mmol/L (ref 98–111)
Creatinine, Ser: 0.8 mg/dL (ref 0.44–1.00)
GFR calc Af Amer: >60 mL/min (ref >60)
GFR calc non Af Amer: >60 mL/min (ref >60)
Glucose, Bld: 111 mg/dL — ABNORMAL HIGH (ref 70–99)
Potassium: 3.1 mmol/L — ABNORMAL LOW (ref 3.5–5.1)
Sodium: 138 mmol/L (ref 135–145)

## 2019-04-10 LAB — MAGNESIUM: Magnesium: 1.4 mg/dL — ABNORMAL LOW (ref 1.7–2.4)

## 2019-04-10 MED ORDER — FAMOTIDINE IN NACL 20-0.9 MG/50ML-% IV SOLN
20.0000 mg | Freq: Once | INTRAVENOUS | Status: AC
  Administered 2019-04-10: 10:00:00 20 mg via INTRAVENOUS
  Filled 2019-04-10: qty 50

## 2019-04-10 MED ORDER — SODIUM CHLORIDE 0.9% FLUSH
10.0000 mL | Freq: Once | INTRAVENOUS | Status: AC
  Administered 2019-04-10: 09:00:00 10 mL via INTRAVENOUS
  Filled 2019-04-10: qty 10

## 2019-04-10 MED ORDER — HEPARIN SOD (PORK) LOCK FLUSH 100 UNIT/ML IV SOLN
500.0000 [IU] | Freq: Once | INTRAVENOUS | Status: AC | PRN
  Administered 2019-04-10: 14:00:00 500 [IU]
  Filled 2019-04-10: qty 5

## 2019-04-10 MED ORDER — MORPHINE SULFATE 2 MG/ML IJ SOLN
2.0000 mg | Freq: Once | INTRAMUSCULAR | Status: AC
  Administered 2019-04-10: 2 mg via INTRAVENOUS
  Filled 2019-04-10: qty 1

## 2019-04-10 MED ORDER — MAGNESIUM SULFATE 4 GM/100ML IV SOLN: 4.0000 g | Freq: Once | INTRAVENOUS | Status: DC

## 2019-04-10 MED ORDER — DEXAMETHASONE SODIUM PHOSPHATE 10 MG/ML IJ SOLN
10.0000 mg | Freq: Once | INTRAMUSCULAR | Status: AC
  Administered 2019-04-10: 10:00:00 10 mg via INTRAVENOUS
  Filled 2019-04-10: qty 1

## 2019-04-10 MED ORDER — SODIUM CHLORIDE 0.9 % IV SOLN
Freq: Once | INTRAVENOUS | Status: AC
  Administered 2019-04-10: 11:00:00 via INTRAVENOUS
  Filled 2019-04-10: qty 100

## 2019-04-10 MED ORDER — SODIUM CHLORIDE 0.9 % IV SOLN
Freq: Once | INTRAVENOUS | Status: AC
  Administered 2019-04-10: 09:00:00 via INTRAVENOUS
  Filled 2019-04-10: qty 250

## 2019-04-10 MED ORDER — DIPHENHYDRAMINE HCL 50 MG/ML IJ SOLN
50.0000 mg | Freq: Once | INTRAMUSCULAR | Status: AC
  Administered 2019-04-10: 50 mg via INTRAVENOUS
  Filled 2019-04-10: qty 1

## 2019-04-10 MED ORDER — SODIUM CHLORIDE 0.9 % IV SOLN
Freq: Once | INTRAVENOUS | Status: AC
  Administered 2019-04-10: 13:00:00 via INTRAVENOUS
  Filled 2019-04-10: qty 250

## 2019-04-10 MED ORDER — SODIUM CHLORIDE 0.9 % IV SOLN
65.0000 mg/m2 | Freq: Once | INTRAVENOUS | Status: AC
  Administered 2019-04-10: 126 mg via INTRAVENOUS
  Filled 2019-04-10: qty 21

## 2019-04-10 MED ORDER — POTASSIUM CHLORIDE 20 MEQ/100ML IV SOLN: 20.0000 meq | Freq: Once | INTRAVENOUS | Status: DC

## 2019-04-10 NOTE — Telephone Encounter (Signed)
Patient called and reports feeling very weak.  Diarrhea is very severe, color and odor are different than her usual chronic diarrhea. She was seen by symptom management nurse practitioner today and a supportive care was given.  When she gets home she feels worse.  Stool sample has not been collected yet.  She has the container. She feels that she almost passing out. I recommend patient to call EMS and go to ER for further evaluation.  Concerned that she might be severely dehydrated.  Patient appreciated advice.

## 2019-04-10 NOTE — Telephone Encounter (Signed)
Patient called cancer center requesting refill of oxycodone 15 mg q 12 hours.   As mandated by the [place] STOP Act (Strengthen Opioid Misuse Prevention), the [place] Controlled Substance Reporting System ([place]) was reviewed for this patient.  Below is the past 29-month of controlled substance prescriptions as displayed by the registry.  I have personally consulted with my supervising physician, Dr. BRogue Bussing who agrees that continuation of opiate therapy is medically appropriate at this time and agrees to provide continual monitoring, including urine/blood drug screens, as indicated. Refill is appropriate on or after 03/26/19.  NCCSRS reviewed: OK to refill.   JFaythe Casa NP 04/10/2019 12:16 PM [phone]

## 2019-04-10 NOTE — Progress Notes (Signed)
Symptom Management Consult note [facility]  [phone] [phone]  Patient Care Team: Arnetha Courser, MD as PCP - General (Family Medicine) Mellody Drown, MD as Consulting Physician (Obstetrics and Gynecology) Lenor Coffin, MD as Attending Physician (Gynecology) Lequita Asal, MD as Medical Oncologist (Hematology and Oncology) [name], MD as Consulting Physician (Gastroenterology) Michael Boston, MD as Consulting Physician (General Surgery) Lovell Sheehan, MD as Consulting Physician (Orthopedic Surgery) Clent Jacks, RN as Registered Nurse   Name of the patient: Joanna Hall  [id number]  [birth year]   Date of visit: 04/10/2019  Diagnosis: Cervical Adenocarcinoma  Chief Complaint: Joint, lower back and abdominal pain; diarrhea  Current Treatment: s/p Cycle 3 (D 1, 8 and 15).   Oncology History:  Oncology History Overview Note  # dec 2017- CERVICAL ADENO CA [[place]]; Stage IB [Dr. Christene Slates at [facility] in [place], [place];No surgery- Chemo-RT;  # 2018-sep - lung nodules [in [place]]  # AUG 20th, 2019-#1 Carbo-Taxol s/p 6 cycles- [No avastin sec to blood clots]: Feb 2020-bilateral lung nodules with 1 cm in size. March 2020- finished carbo-taxol #7; poor tolerance to [place].  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  She received treatment from 11/2016-05/2017.  01/2017 cisplatin x 2 and carboplatin x 1 (01/29/2017) due to ARF and XRT.  XRT was followed  by T & O on 02/01/2017 and T & N 02/10/2017 and 02/20/2017.  Course was complicated by 80 pound weight loss, nausea, vomiting, electrolyte wasting (potassium and magnesium).  She describes that.  Is been sick constantly requiring at least 20 hospitalizations.  Follow-up CT chest and PET on 06/2017. Per patient, 'radiation worked' and no disease in the abdomen.  At that time she was noted to have lung nodules that were growing and follow-up imaging was scheduled for 10/2017.  She was admitted to hospital in [place] for small bowel obstruction which was managed conservatively and she was home for a week prior to traveling to [place] for Thanksgiving holiday where she has family.  She presented to ER in [place] on 08/2017 with nausea, vomiting, and lower abdominal pain.  Symptoms did not respond to conservative treatment.  CT on 08/26/2017 revealed small bowel obstruction with transition in the pelvis just superior to the uterus rather was a long segment of distal ileum with fatty wall thickening compatible with chronic inflammation and/or radiation enteritis. Imaging showed numerous pulmonary nodules consistent with metastatic disease. She underwent laparotomy and right ileocolectomy on 08/31/2017 at [facility].  Surgical findings revealed a thickened, matted, and scarred piece of distal small bowel close to the ileocecal valve.  She was discharged on 09/05/2017.  Pain markedly increased in intensity and imaging was performed on 09/11/2017 which revealed: Debris within the anterior abdominal wall incision concerning for infection versus packing material, s/p post ileo-colectomy with expected postoperative changes, mild colonic ileus, numerous pulmonary nodules highly concerning for metastatic disease, punctate nonobstructing nephrolithiasis.  Staples were removed and one was packed.  She was started on doxycycline.  Abdominal and pelvic CT without contrast on 09/11/2017 revealed debris  within  anterior abdominal wall incision concerning for infection, versus packing material.She is s/pileocolectomy with expected postoperative changes and mild colonic ileus. There were numerous pulmonary nodules highly concerning for metastatic diseaseand punctate nonobstructing nephrolithiasis. She was readmitted on 09/12/2017. She describes the onset of lower abdominal pain on 09/09/2017. Pain markedly increased in intensity on 09/11/2017.  Staples were removed and the wound packed. She was started on doxycycline.  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  PET scan was ordered to evaluate enlarging lung nodules with concern for recurrent cervical cancer but scan was delayed due to insurance and need to be performed in [place].  Presented to ER on 12/03/2017 for abdominal pain and emesis.  Imaging concerning for worsening possible uterine infarct and she was admitted to hospital.  Pelvic MRI was unremarkable.  Remote scarring type changes of uterus thought to be possibly related to radiation.  She was discharged on 12/08/2017.  Underwent endoscopy and colonoscopy on 12/20/2017.    On 02/22/2018 she underwent laser ablation of condylomata around the anus and vagina under anesthesia with Dr. Johney Maine.   04/15/2018: Chest, abdomen, and pelvis CTrevealed innumerable (>100) cavitary nodules scattered in the lungs, moderately enlarging compared to the 11/08/2017 PET-CT, suspicious for metastatic disease. One index node in the RLL measures 1.0 x 1.1 cm (previously 0.6 x 0.6 cm). There were no new nodules. There was an  ill-defined wall thickening in the rectosigmoid with surrounding stranding along fascia planes, probably sequela from prior radiation therapy. There was multilevel lumbar impingement due to spondylosis and degenerative disc disease. There was heterogeneous enhancement in the uterus (some possibly from prior radiation therapy).  04/23/2018:PET scan revealednumerous scattered solid and cavitary nodules in the lungs stable increased in size compared to the prior PET-CT from 11/08/2017.Largest nodule was 1.1 cm in the LUL (SUV 1.9).These demonstratedlow-grade metabolic activity up to a maximum SUV of about 2.3, increased from 11/08/2017.   Case was discussed at tumor board on 04/25/2018. Consensus to pursue CT-guided biopsy (05/06/18) which revealed: Metastatic adenocarcinoma, morphologically consistent with cervical adenocarcinoma.  She has history of chronic hepatitis C which is managed by GI.  Hepatitis C genotype is 2a/2c.  She receives B12 injections for history of B12 deficiency.  On 04/24/2018 she underwent left total knee replacement with Dr. [name]. --------------------------------------------------------   DIAGNOSIS: Cervical adenocarcinoma  STAGE: IV        ;GOALS: Palliative  CURRENT/MOST RECENT THERAPY : Taxol single agent    Malignant neoplasm of overlapping sites of cervix ([place])  01/29/2019 -  Chemotherapy   The patient had PACLitaxel (TAXOL) 156 mg in sodium chloride 0.9 % 250 mL chemo infusion (</= 3m/m2), 80 mg/m2 = 156 mg, Intravenous,  Once, 3 of 4 cycles Dose modification: 65 mg/m2 (original dose 80 mg/m2, Cycle 1, Reason: Provider Judgment) Administration: 156 mg (01/29/2019), 156 mg (02/05/2019), 126 mg (02/12/2019), 126 mg (02/26/2019), 126 mg (03/13/2019), 126 mg (03/27/2019), 126 mg (03/20/2019), 126 mg (04/03/2019)  for chemotherapy treatment.      Subjective Data:   ECOG: 1 - Symptomatic but completely ambulatory  Subjective:     KSPIRIT WERNLIis a 53y.o.  female who  presents for evaluation of abdominal pain, lower back pain and . Onset was 2 days ago. Symptoms have been stable. The pain is described as aching, cramping and pressure-like, and is 7/10 in intensity. Pain is located in the epigastric region, suprapubic region and join pain (toes and fingers and right knee) with radiation to right hip.  Aggravating factors: activity, bowel movement, eating and urination.  Alleviating factors: resting. Associated symptoms: chills, diarrhea, dysuria and nausea. The patient denies constipation, fever and headache.  The patient's history has been marked as reviewed and updated as appropriate.  Review of Systems A comprehensive review of systems was negative except for: Constitutional: positive for chills, fatigue and night sweats Respiratory: positive for dyspnea on exertion Cardiovascular: positive for fatigue and lower extremity edema Gastrointestinal: positive for abdominal pain, diarrhea and nausea Genitourinary: positive for decreased stream Musculoskeletal: positive for arthralgias and muscle weakness     Objective:    There were no vitals taken for this visit. General appearance: alert, fatigued, no distress and pale Lungs: clear to auscultation bilaterally Heart: regular rate and rhythm, S1, S2 normal, no murmur, click, rub or gallop Abdomen: abnormal findings:  moderate tenderness in the LLQ and in the right flank Skin: Skin color, texture, turgor normal. No rashes or lesions Neurologic: Grossly normal    Assessment:    Abdominal pain, likely secondary to unknown etiology .    Joint pain d/t OA. Seeing her orthopedic physician today; Dr. [name].    Plan:   Mrs. Sida is a 53 year old female who presents to [facility] for complaints of pain in lower back that radiates to right lower abdomen into her pelvis, bilateral hand and foot joint pain and right knee pain.  Symptoms have been present for approximately 2 days.  She also complains of  diarrhea (chronic) change in odor and change in color of her urine.   History of cervical cancer: Current treatment single agent Taxol status post cycle 3-day 15. (01/29/19-current).  Tolerating better than combination therapy with carbo/Taxol.  Plan is for single agent Taxol 3 weeks on with 1 week off.  Most recent imaging with CT abdomen/pelvis completed on 01/23/2019 revealed stable disease without interval change.  Plan is to rescan in 3 to 4 months.  Lower back pain with radiation to right lower abdomen/pelvis: Unclear etiology.  We will collect UA and urine culture to rule out urinary tract infection.  Could be musculoskeletal.  Has history of right hip pain.  She has been evaluated by Dr. [name] her orthopedic physician today.  I have asked that she bring this up with him.  Joint pain; bilateral upper and lower extremities; mainly affecting small joints: Osteoarthritis.  Negative for RA.  Patient to be seen by orthopedic physician today.  Patient has questions today regarding cortisone injections to right knee for severe arthritis and possible fluid removal.  Per Dr. Rogue Bussing, patient is okay to have injection as long as her blood counts are stable.  Today her [place] is 2.1 and her platelet count is 155,000.  Based on those blood counts, she is okay to have her injection as it will take several days for her blood counts to drop after receiving single agent Taxol.  Diarrhea: Likely related to known chronic diarrhea from previous colectomy.  Patient states consistency is pure water and odor is different.  We will collect a sample to rule out infection.  Continue Lomotil, Imodium and Viberzi as previously prescribed.  Plan: Give 2 mg IV morphine while receiving treatment.  Collect urinalysis/urine culture rule out UTI. Collect stool sample.  Replace electrolytes per Dr. Aletha Halim orders. Taxol today as planned.  Disposition: Continue OxyContin 15 mg every 12 hours.  Continue oxycodone 10 mg  tablets q 6 hours PRN.  Patient to see Dr. [name] today in orthopedics for right knee fluid removal and possible cortisone injection.  RTC as scheduled on 04/21/2019 for CT abdomen pelvis. Return to clinic as scheduled on 04/24/2019 for results of CT scan and MD assessment. Will call patient with results of urinalysis and stool sample.  Greater than 50% was spent in counseling and coordination of care with this patient including but not limited to discussion of the relevant topics above (See A&P) including, but not limited to diagnosis and management of acute and chronic medical conditions.   Faythe Casa, NP 04/10/2019 1:10 PM

## 2019-04-11 ENCOUNTER — Other Ambulatory Visit: Payer: Self-pay | Admitting: Oncology

## 2019-04-11 ENCOUNTER — Other Ambulatory Visit: Payer: Self-pay

## 2019-04-11 ENCOUNTER — Inpatient Hospital Stay: Payer: Medicaid Other

## 2019-04-11 ENCOUNTER — Inpatient Hospital Stay (HOSPITAL_BASED_OUTPATIENT_CLINIC_OR_DEPARTMENT_OTHER): Payer: Medicaid Other | Admitting: Hospice and Palliative Medicine

## 2019-04-11 ENCOUNTER — Other Ambulatory Visit: Payer: Self-pay | Admitting: *Deleted

## 2019-04-11 ENCOUNTER — Encounter: Payer: Self-pay | Admitting: Oncology

## 2019-04-11 ENCOUNTER — Encounter: Payer: Self-pay | Admitting: Hospice and Palliative Medicine

## 2019-04-11 VITALS — BP 134/81 | HR 87 | Temp 98.1°F | Resp 20

## 2019-04-11 DIAGNOSIS — D638 Anemia in other chronic diseases classified elsewhere: Secondary | ICD-10-CM

## 2019-04-11 DIAGNOSIS — R197 Diarrhea, unspecified: Secondary | ICD-10-CM

## 2019-04-11 DIAGNOSIS — K922 Gastrointestinal hemorrhage, unspecified: Secondary | ICD-10-CM

## 2019-04-11 DIAGNOSIS — Z95828 Presence of other vascular implants and grafts: Secondary | ICD-10-CM

## 2019-04-11 DIAGNOSIS — R531 Weakness: Secondary | ICD-10-CM | POA: Diagnosis not present

## 2019-04-11 DIAGNOSIS — C530 Malignant neoplasm of endocervix: Secondary | ICD-10-CM

## 2019-04-11 DIAGNOSIS — E860 Dehydration: Secondary | ICD-10-CM | POA: Diagnosis not present

## 2019-04-11 DIAGNOSIS — Z5111 Encounter for antineoplastic chemotherapy: Secondary | ICD-10-CM | POA: Diagnosis not present

## 2019-04-11 DIAGNOSIS — K529 Noninfective gastroenteritis and colitis, unspecified: Secondary | ICD-10-CM

## 2019-04-11 DIAGNOSIS — E876 Hypokalemia: Secondary | ICD-10-CM

## 2019-04-11 LAB — CBC WITH DIFFERENTIAL/PLATELET
Abs Immature Granulocytes: 0.02 10*3/uL (ref 0.00–0.07)
Basophils Absolute: 0 10*3/uL (ref 0.0–0.1)
Basophils Relative: 0 %
Eosinophils Absolute: 0 10*3/uL (ref 0.0–0.5)
Eosinophils Relative: 0 %
HCT: 24.3 % — ABNORMAL LOW (ref 36.0–46.0)
Hemoglobin: 8.1 g/dL — ABNORMAL LOW (ref 12.0–15.0)
Immature Granulocytes: 1 %
Lymphocytes Relative: 21 %
Lymphs Abs: 0.5 10*3/uL — ABNORMAL LOW (ref 0.7–4.0)
MCH: 35.4 pg — ABNORMAL HIGH (ref 26.0–34.0)
MCHC: 33.3 g/dL (ref 30.0–36.0)
MCV: 106.1 fL — ABNORMAL HIGH (ref 80.0–100.0)
Monocytes Absolute: 0.1 10*3/uL (ref 0.1–1.0)
Monocytes Relative: 4 %
Neutro Abs: 1.8 10*3/uL (ref 1.7–7.7)
Neutrophils Relative %: 74 %
Platelets: 141 10*3/uL — ABNORMAL LOW (ref 150–400)
RBC: 2.29 MIL/uL — ABNORMAL LOW (ref 3.87–5.11)
RDW: 14.6 % (ref 11.5–15.5)
WBC: 2.4 10*3/uL — ABNORMAL LOW (ref 4.0–10.5)
nRBC: 0 % (ref 0.0–0.2)

## 2019-04-11 LAB — URINE CULTURE: Culture: NO GROWTH

## 2019-04-11 LAB — GASTROINTESTINAL PANEL BY PCR, STOOL (REPLACES STOOL CULTURE)

## 2019-04-11 LAB — COMPREHENSIVE METABOLIC PANEL
ALT: 35 U/L (ref 0–44)
AST: 27 U/L (ref 15–41)
Albumin: 3.3 g/dL — ABNORMAL LOW (ref 3.5–5.0)
Alkaline Phosphatase: 133 U/L — ABNORMAL HIGH (ref 38–126)
Anion gap: 6 (ref 5–15)
BUN: 9 mg/dL (ref 6–20)
CO2: 23 mmol/L (ref 22–32)
Calcium: 8.1 mg/dL — ABNORMAL LOW (ref 8.9–10.3)
Chloride: 110 mmol/L (ref 98–111)
Creatinine, Ser: 0.68 mg/dL (ref 0.44–1.00)
GFR calc Af Amer: >60 mL/min (ref >60)
GFR calc non Af Amer: >60 mL/min (ref >60)
Glucose, Bld: 122 mg/dL — ABNORMAL HIGH (ref 70–99)
Potassium: 3.1 mmol/L — ABNORMAL LOW (ref 3.5–5.1)
Sodium: 139 mmol/L (ref 135–145)
Total Bilirubin: 0.3 mg/dL (ref 0.3–1.2)
Total Protein: 6.3 g/dL — ABNORMAL LOW (ref 6.5–8.1)

## 2019-04-11 LAB — C DIFFICILE QUICK SCREEN W PCR REFLEX
C Diff antigen: NEGATIVE
C Diff interpretation: NOT DETECTED
C Diff toxin: NEGATIVE

## 2019-04-11 LAB — MAGNESIUM: Magnesium: 1.9 mg/dL (ref 1.7–2.4)

## 2019-04-11 MED ORDER — POTASSIUM CHLORIDE 20 MEQ/100ML IV SOLN
20.0000 meq | Freq: Once | INTRAVENOUS | Status: AC
  Administered 2019-04-11: 20 meq via INTRAVENOUS

## 2019-04-11 MED ORDER — SODIUM CHLORIDE 0.9 % IV SOLN
Freq: Once | INTRAVENOUS | Status: AC
  Administered 2019-04-11: 11:00:00 via INTRAVENOUS
  Filled 2019-04-11: qty 250

## 2019-04-11 MED ORDER — HEPARIN SOD (PORK) LOCK FLUSH 100 UNIT/ML IV SOLN
500.0000 [IU] | Freq: Once | INTRAVENOUS | Status: AC
  Administered 2019-04-11: 500 [IU] via INTRAVENOUS

## 2019-04-11 MED ORDER — SODIUM CHLORIDE 0.9% FLUSH
10.0000 mL | INTRAVENOUS | Status: DC | PRN
  Administered 2019-04-11: 10 mL via INTRAVENOUS
  Filled 2019-04-11: qty 10

## 2019-04-11 NOTE — Telephone Encounter (Signed)
error 

## 2019-04-11 NOTE — Progress Notes (Signed)
[facility]pt[facility]  [phone] [phone]  Patient Care Team: Arnetha Courser, MD as PCP - General (Family Medicine) Mellody Drown, MD as Consulting Physician (Obstetrics and Gynecology) Lenor Coffin, MD as Attending Physician (Gynecology) Lequita Asal, MD as Medical Oncologist (Hematology and Oncology) [name], MD as Consulting Physician (Gastroenterology) Michael Boston, MD as Consulting Physician (General Surgery) Lovell Sheehan, MD as Consulting Physician (Orthopedic Surgery) Clent Jacks, RN as Registered Nurse   Name of the patient: Joanna Hall  [id number]  1966-07-25   Date of visit: 04/11/19  Diagnosis- Cervical cancer  Chief complaint/ Reason for visit- weakness, nausea  Heme/Onc history:  Oncology History Overview Note  # dec 2017- CERVICAL ADENO CA [[place]]; Stage IB [[facility] [place], [place];No surgery- Chemo-RT;  # 2018-sep - lung nodules [in [place]]  # AUG 20th, 2019-#1 Carbo-Taxol s/p 6 cycles- [No avastin sec to blood clots]: Feb 2020-bilateral lung nodules with 1 cm in size. March 2020- finished carbo-taxol #7; poor tolerance to [place].  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  She received treatment from 11/2016-05/2017.  01/2017 cisplatin x 2 and carboplatin x 1 (01/29/2017) due to ARF and XRT.  XRT was followed by T & O on 02/01/2017 and T & N 02/10/2017 and 02/20/2017.  Course was complicated  by 80 pound weight loss, nausea, vomiting, electrolyte wasting (potassium and magnesium).  She describes that.  Is been sick constantly requiring at least 20 hospitalizations.  Follow-up CT chest and PET on 06/2017. Per patient, 'radiation worked' and no disease in the abdomen.  At that time she was noted to have lung nodules that were growing and follow-up imaging was scheduled for 10/2017.  She was admitted to hospital in [place] for small bowel obstruction which was managed conservatively and she was home for a week prior to traveling to [place] for Thanksgiving holiday where she has family.  She presented to ER in [place] on 08/2017 with nausea, vomiting, and lower abdominal pain.  Symptoms did not respond to conservative treatment.  CT on 08/26/2017 revealed small bowel obstruction with transition in the pelvis just superior to the uterus rather was a long segment of distal ileum with fatty wall thickening compatible with chronic inflammation and/or radiation enteritis. Imaging showed numerous pulmonary nodules consistent with metastatic disease. She underwent laparotomy and right ileocolectomy on 08/31/2017 at [facility].  Surgical findings revealed a thickened, matted, and scarred piece of distal small bowel close to the ileocecal valve.  She was discharged on 09/05/2017.  Pain markedly increased in intensity and imaging was performed on 09/11/2017 which revealed: Debris within the anterior abdominal wall incision concerning for infection versus packing material, s/p post ileo-colectomy with expected postoperative changes, mild colonic ileus, numerous pulmonary nodules highly concerning for metastatic disease, punctate nonobstructing nephrolithiasis.  Staples were removed and one was packed.  She was started on doxycycline.  Abdominal and pelvic CT without contrast on 09/11/2017 revealed debris within anterior abdominal wall incision concerning for infection, versus packing  material.She is s/pileocolectomy with  expected postoperative changes and mild colonic ileus. There were numerous pulmonary nodules highly concerning for metastatic diseaseand punctate nonobstructing nephrolithiasis. She was readmitted on 09/12/2017. She describes the onset of lower abdominal pain on 09/09/2017. Pain markedly increased in intensity on 09/11/2017.  Staples were removed and the wound packed. She was started on doxycycline.  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  PET scan was ordered to evaluate enlarging lung nodules with concern for recurrent cervical cancer but scan was delayed due to insurance and need to be performed in [place].  Presented to ER on 12/03/2017 for abdominal pain and emesis.  Imaging concerning for worsening possible uterine infarct and she was admitted to hospital.  Pelvic MRI was unremarkable.  Remote scarring type changes of uterus thought to be possibly related to radiation.  She was discharged on 12/08/2017.  Underwent endoscopy and colonoscopy on 12/20/2017.    On 02/22/2018 she underwent laser ablation of condylomata around the anus and vagina under anesthesia with Dr. Johney Maine.   04/15/2018: Chest, abdomen, and pelvis CTrevealed innumerable (>100) cavitary nodules scattered in the lungs, moderately enlarging compared to the 11/08/2017 PET-CT, suspicious for metastatic disease. One index node in the RLL measures 1.0 x 1.1 cm (previously 0.6 x 0.6 cm). There were no new nodules. There was an ill-defined wall thickening in the rectosigmoid with surrounding stranding  along fascia planes, probably sequela from prior radiation therapy. There was multilevel lumbar impingement due to spondylosis and degenerative disc disease. There was heterogeneous enhancement in the uterus (some possibly from prior radiation therapy).  04/23/2018:PET scan revealednumerous scattered solid and cavitary nodules in the lungs stable increased in size compared to the prior PET-CT from 11/08/2017.Largest nodule was 1.1 cm in the LUL (SUV 1.9).These demonstratedlow-grade metabolic activity up to a maximum SUV of about 2.3, increased from 11/08/2017.   Case was discussed at tumor board on 04/25/2018. Consensus to pursue CT-guided biopsy (05/06/18) which revealed: Metastatic adenocarcinoma, morphologically consistent with cervical adenocarcinoma.  She has history of chronic hepatitis C which is managed by GI.  Hepatitis C genotype is 2a/2c.  She receives B12 injections for history of B12 deficiency.  On 04/24/2018 she underwent left total knee replacement with Dr. [name]. --------------------------------------------------------   DIAGNOSIS: Cervical adenocarcinoma  STAGE: IV        ;GOALS: Palliative  CURRENT/MOST RECENT THERAPY : Taxol single agent    Malignant neoplasm of overlapping sites of cervix ([place])  01/29/2019 -  Chemotherapy   The patient had PACLitaxel (TAXOL) 156 mg in sodium chloride 0.9 % 250 mL chemo infusion (</= 21m/m2), 80 mg/m2 = 156 mg, Intravenous,  Once, 3 of 4 cycles Dose modification: 65 mg/m2 (original dose 80 mg/m2, Cycle 1, Reason: Provider Judgment) Administration: 156 mg (01/29/2019), 156 mg (02/05/2019), 126 mg (02/12/2019), 126 mg (02/26/2019), 126 mg (03/13/2019), 126 mg (03/27/2019), 126 mg (03/20/2019), 126 mg (04/03/2019), 126 mg (04/10/2019)  for chemotherapy treatment.      Interval history-patient was seen in the clinic yesterday and received IV fluids with potassium and magnesium.  She is continue to endorse chronic diarrhea and came today for to  deliver a stool sample.  She feels weak with  poor oral intake.  No fever or chills.  No nausea or vomiting.  She continues to have chronic diarrhea, which is been evaluated multiple times by gastroenterology.  She requests IV fluids today.   ECOG FS:2 - Symptomatic, <50% confined to bed  Review of systems- Review of Systems  Constitutional: Positive for malaise/fatigue. Negative for chills, diaphoresis and fever.  Respiratory: Positive for shortness of breath. Negative for cough and wheezing.   Cardiovascular: Negative for chest pain and orthopnea.  Gastrointestinal: Positive for diarrhea. Negative for abdominal pain, blood in stool, constipation, nausea and vomiting.  Genitourinary: Negative for flank pain.  Neurological: Positive for dizziness and weakness. Negative for sensory change, focal weakness and headaches.     Current treatment- Taxol  Allergies  Allergen Reactions   Ketamine Anxiety and Other (See Comments)    Syncope episode/confusion     Past Medical History:  Diagnosis Date   Abdominal pain 06/10/2018   Abnormal cervical Papanicolaou smear 09/18/2017   Anxiety    Aortic atherosclerosis (HCC)    Arthritis    neck and knees   Blood clots in brain    both lungs and right kidney   Blood transfusion without reported diagnosis    Cervical cancer ([place]) 09/2016   mets lung   Chronic anal fissure    Chronic diarrhea    Dyspnea    Erosive gastropathy 09/18/2017   Factor V Leiden mutation ([place])    Fecal incontinence    Genital warts    GERD (gastroesophageal reflux disease)    Heart murmur    Hematochezia    Hemorrhoids    Hepatitis C    Chronic, 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  Negative test 10/2246   Ileocolic anastomotic leak    Infarction of kidney (HCC) left kidney     and uterus   Intestinal infection due to Clostridium difficile 09/18/2017   Macrocytic anemia with vitamin B12 deficiency    Multiple gastric ulcers    Nausea vomiting and diarrhea    Pancolitis ([place]) 07/27/2018   Perianal condylomata    Pneumonia    History of   Pulmonary nodules    Rectal bleeding    Small bowel obstruction ([place]) 08/2017   Stiff neck    limited right turn   Vitamin D deficiency     Past Surgical History:  Procedure Laterality Date   CHOLECYSTECTOMY     COLON SURGERY  08/2017   resection   COLONOSCOPY WITH PROPOFOL N/A 12/20/2017   Procedure: COLONOSCOPY WITH PROPOFOL;  Surgeon: [name], MD;  Location: [facility];  Service: Gastroenterology;  Laterality: N/A;   COLONOSCOPY WITH PROPOFOL N/A 07/30/2018   Procedure: COLONOSCOPY WITH PROPOFOL;  Surgeon: [name], MD;  Location: [facility] ENDOSCOPY;  Service: Gastroenterology;  Laterality: N/A;   COLONOSCOPY WITH PROPOFOL N/A 10/10/2018   Procedure: COLONOSCOPY WITH PROPOFOL;  Surgeon: Lucilla Lame, MD;  Location: [facility] ENDOSCOPY;  Service: Endoscopy;  Laterality: N/A;   DIAGNOSTIC LAPAROSCOPY     ESOPHAGOGASTRODUODENOSCOPY (EGD) WITH PROPOFOL N/A 12/20/2017   Procedure: ESOPHAGOGASTRODUODENOSCOPY (EGD) WITH PROPOFOL;  Surgeon: [name], MD;  Location: [facility] ENDOSCOPY;  Service: Gastroenterology;  Laterality: N/A;   ESOPHAGOGASTRODUODENOSCOPY (EGD) WITH PROPOFOL  07/30/2018   Procedure: ESOPHAGOGASTRODUODENOSCOPY (EGD) WITH PROPOFOL;  Surgeon: [name], MD;  Location: [place];  Service: Gastroenterology;;   Otho Darner SIGMOIDOSCOPY N/A 11/21/2018   Procedure: FLEXIBLE SIGMOIDOSCOPY;  Surgeon: [name], MD;  Location: [facility] ENDOSCOPY;  Service: Gastroenterology;  Laterality: N/A;   LAPAROTOMY N/A 08/31/2017   Procedure: EXPLORATORY LAPAROTOMY for SBO, ileocolectomy, removal of piece of uterine wall;  Surgeon: [name], MD;  Location: [facility]  ORS;  Service: General;  Laterality: N/A;   LASER ABLATION CONDOLAMATA N/A 02/22/2018   Procedure: LASER ABLATION/REMOVAL OF INOMVEHMCNO AROUND ANUS AND VAGINA;  Surgeon: Michael Boston, MD;  Location: [place];  Service: General;  Laterality: N/A;   OOPHORECTOMY     PORTA CATH INSERTION N/A 05/13/2018   Procedure: PORTA CATH INSERTION;  Surgeon: Katha Cabal, MD;  Location: [place] CV LAB;  Service: Cardiovascular;  Laterality: N/A;   SMALL INTESTINE SURGERY     TANDEM RING INSERTION     x3   THORACOTOMY     TOTAL KNEE ARTHROPLASTY Left 04/24/2018   Procedure: TOTAL KNEE ARTHROPLASTY;  Surgeon: Lovell Sheehan, MD;  Location: [facility] ORS;  Service: Orthopedics;  Laterality: Left;    Social History   Socioeconomic History   Marital status: Divorced    Spouse name: Not on file   Number of children: Not on file   Years of education: Not on file   Highest education level: Not on file  Occupational History   Not on file  Social Needs   Financial resource strain: Not hard at all   Food insecurity    Worry: Never true    Inability: Never true   Transportation needs    Medical: No    Non-medical: No  Tobacco Use   Smoking status: Former Smoker    Quit date: 10/16/2006    Years since quitting: 12.4   Smokeless tobacco: Never Used  Substance and Sexual Activity   Alcohol use: Not Currently    Frequency: Never    Comment: seldom   Drug use: Yes    Types: Marijuana   Sexual activity: Not Currently    Birth control/protection: Post-menopausal    Comment: Not Asked  Lifestyle   Physical activity    Days per week: Patient refused    Minutes per session: Patient refused   Stress: Only a little  Relationships   Press photographer on phone: Patient refused    Gets together: Patient refused    Attends religious service: Patient refused    Active member of club or organization: Patient refused    Attends meetings of clubs or  organizations: Patient refused    Relationship status: Patient refused   Intimate partner violence    Fear of current or ex partner: No    Emotionally abused: No    Physically abused: No    Forced sexual activity: No  Other Topics Concern   Not on file  Social History Narrative   Not on file    Family History  Problem Relation Age of Onset   Hypertension Father    Diabetes Father    Alcohol abuse Daughter    Hypertension Maternal Grandmother    Diabetes Maternal Grandmother    Diabetes Paternal Grandmother    Hypertension Paternal Grandmother      Current Outpatient Medications:    amitriptyline (ELAVIL) 75 MG tablet, Take 1 tablet (75 mg total) by mouth at bedtime., Disp: 90 tablet, Rfl: 1   budesonide (ENTOCORT EC) 3 MG 24 hr capsule, Take 3 capsules (9 mg total) by mouth daily for 30  days., Disp: 90 capsule, Rfl: 2   Calcium Carb-Cholecalciferol (CALCIUM 500 +D) 500-400 MG-UNIT TABS, Take 2 tablets by mouth daily., Disp: , Rfl:    diphenoxylate-atropine (LOMOTIL) 2.5-0.025 MG tablet, TAKE 2 TABLETS BY MOUTH FOUR TIMES A DAY, Disp: 240 tablet, Rfl: 0   famotidine (PEPCID) 20 MG tablet, Take 1 tablet (20 mg total) by mouth 2 (two) times daily as needed for heartburn or indigestion., Disp: , Rfl:    hyoscyamine (LEVBID) 0.375 MG 12 hr tablet, Take 1 tablet (0.375 mg total)by mouth 2 (two) times daily., Disp: 60 tablet, Rfl: 0   meclizine (ANTIVERT) 25 MG tablet, Take 1 tablet (25 mg total) by mouth 2 (two) times daily as needed for dizziness., Disp: 15 tablet, Rfl: 0   Multiple Vitamins-Minerals (MULTIVITAMIN WITH MINERALS) tablet, Take 1 tablet by mouth daily., Disp: 30 tablet, Rfl: 0   ondansetron (ZOFRAN ODT) 4 MG disintegrating tablet, Take 1 tablet (4 mg total) by mouth every 8 (eight) hours as needed for nausea or vomiting., Disp: 30 tablet, Rfl: 2   oxyCODONE (OXYCONTIN) 15 mg 12 hr tablet, Take 1 tablet (15 mg total) by mouth every 12 (twelve) hours.,  Disp: 30 tablet, Rfl: 0   Oxycodone HCl 10 MG TABS, Take 1 tablet (10 mg total) by mouth every 6 (six) hours as needed (for breakthrough pain)., Disp: 90 tablet, Rfl: 0   pantoprazole (PROTONIX) 40 MG tablet, Take 1 tablet (40 mg total) by mouth daily., Disp: 90 tablet, Rfl: 1   potassium chloride SA (K-DUR,KLOR-CON) 20 MEQ tablet, Take 1 tablet (20 mEq total) by mouth daily. (Patient taking differently: Take 20 mEq by mouth 2 (two) times daily. ), Disp: 30 tablet, Rfl: 3   promethazine (PHENERGAN) 25 MG tablet, Take 1 tablet (25 mg total) by mouth every 8 (eight) hours as needed for nausea or vomiting., Disp: 30 tablet, Rfl: 3   rivaroxaban (XARELTO) 20 MG TABS tablet, Take 1 tablet (20 mg total) by mouth daily with supper., Disp: 30 tablet, Rfl: 1   VENTOLIN HFA 108 (90 Base) MCG/ACT inhaler, Inhale 1-2 puffs into the lungs every 4 (four) hours as needed for shortness of breath., Disp: 1 Inhaler, Rfl: 1   VIBERZI 100 MG TABS, TAKE 1 TABLET BY MOUTH TWICE DAILY WITH A MEAL, Disp: 60 tablet, Rfl: 0 No current facility-administered medications for this visit.   Facility-Administered Medications Ordered in Other Visits:    heparin lock flush 100 unit/mL, 500 Units, Intravenous, Once, Corcoran, Melissa C, MD   heparin lock flush 100 unit/mL, 500 Units, Intracatheter, Once PRN, Charlaine Dalton R, MD   potassium chloride 20 mEq in 100 mL IVPB, 20 mEq, Intravenous, Once, Mayzie Caughlin, Vonna Kotyk R, NP   sodium chloride 0.9 % 1,000 mL with potassium chloride 20 mEq infusion, , Intravenous, Once, Honor Loh E, NP   sodium chloride flush (NS) 0.9 % injection 10 mL, 10 mL, Intravenous, Once, Deyani Hegarty, Kirt Boys, NP  Physical exam:  Vitals:   04/11/19 1106  BP: 134/81  Pulse: 87  Resp: 20  Temp: 98.1 F (36.7 C)  TempSrc: Tympanic   Physical Exam Constitutional:      Appearance: Normal appearance.  HENT:     Mouth/Throat:     Mouth: Mucous membranes are moist.  Cardiovascular:     Rate  and Rhythm: Normal rate and regular rhythm.  Pulmonary:     Effort: Pulmonary effort is normal.     Breath sounds: Normal breath sounds.  Abdominal:  General: There is no distension.     Palpations: Abdomen is soft.     Tenderness: There is no abdominal tenderness. There is no guarding.  Skin:    General: Skin is warm and dry.  Neurological:     General: No focal deficit present.     Mental Status: She is alert and oriented to person, place, and time. Mental status is at baseline.      CMP Latest Ref Rng & Units 04/11/2019  Glucose 70 - 99 mg/dL 122(H)  BUN 6 - 20 mg/dL 9  Creatinine 0.44 - 1.00 mg/dL 0.68  Sodium 135 - 145 mmol/L 139  Potassium 3.5 - 5.1 mmol/L 3.1(L)  Chloride 98 - 111 mmol/L 110  CO2 22 - 32 mmol/L 23  Calcium 8.9 - 10.3 mg/dL 8.1(L)  Total Protein 6.5 - 8.1 g/dL 6.3(L)  Total Bilirubin 0.3 - 1.2 mg/dL 0.3  Alkaline Phos 38 - 126 U/L 133(H)  AST 15 - 41 U/L 27  ALT 0 - 44 U/L 35   CBC Latest Ref Rng & Units 04/11/2019  WBC 4.0 - 10.5 K/uL 2.4(L)  Hemoglobin 12.0 - 15.0 g/dL 8.1(L)  Hematocrit 36.0 - 46.0 % 24.3(L)  Platelets 150 - 400 K/uL 141(L)    No images are attached to the encounter.  Dg Abd 1 View  Result Date: 03/12/2019 CLINICAL DATA:  Right flank pain EXAM: ABDOMEN - 1 VIEW COMPARISON:  CT abdomen pelvis 01/23/2019 FINDINGS: Lumbar scoliosis and multilevel disc degeneration. No acute skeletal abnormality. Surgical clips in the gallbladder fossa. Surgical staples in the right abdomen. Normal bowel gas pattern.Negative for urinary tract calculi. IMPRESSION: No acute abnormality. Lumbar spine scoliosis and degenerative change. Electronically Signed   By: Franchot Gallo M.D.   On: 03/12/2019 13:38    Assessment - Patient is a 53 y.o. female with cervical cancer currently on treatment with single agent Taxol (last received 03/27/2019) who presents to the clinic with recurrent weakness and N/V/D, most recently with symptoms over the past few days.     Cervical Cancer: currently on chemo holiday from Taxol.  CT scan pending on 04/21/2019.  Patient is followed by Dr. Rogue Bussing.  Dehydration/weakness - secondary to chronic diarrhea unresponsive to antidiarrheals.  She is followed by GI.  We will plan to give IV fluids today.    Hypokalemia: likely from chronic diarrhea. Repeat C met shows hypokalemia despite her receiving potassium yesterday.  We will give potassium 20 mEq IV piggyback today.  Patient is due back for recheck labs of Tuesday of next week.  Case and plan discussed with Dr. Rogue Bussing.   Plan: -Check CBC, CMET, Mg -1L NS today -Potassium 20 mEq IV piggyback -Recheck CBC, CMET, Mg next week -Continue Phenergan, Zofran and famotidine PRN -Continue Viberzi, Lomotil and Imodium PRN -Follow up visit with Dr. Tish Men on 7/9 to review CT scan -[facility] visits PRN. Will continue to follow in the context of palliative care      Visit Diagnosis No diagnosis found.  Patient expressed understanding and was in agreement with this plan. She also understands that She can call clinic at any time with any questions, concerns, or complaints.   Thank you for allowing me to participate in the care of this very pleasant patient.   Time Total: 20 minutes  Visit consisted of counseling and education dealing with the complex and emotionally intense issues of symptom management and palliative care in the setting of serious and potentially life-threatening illness.Greater than 50%  of this  time was spent counseling and coordinating care related to the above assessment and plan.  Signed by: Altha Harm, PhD, NP-C [phone] (Work Cell)   CC:

## 2019-04-11 NOTE — Progress Notes (Signed)
Patient here today to be evaluated in the symptom management clinic for weakness and diarrhea. She received her chemotherapy treatment yesterday, along with fluids and IV magnesium. She states that when she got home, she was feeling really bad and had a lot of diarrhea through the night, making her very weak. She brought in a stool sample today to be checked for c-diff and a gi panel.

## 2019-04-14 ENCOUNTER — Inpatient Hospital Stay: Payer: Medicaid Other

## 2019-04-14 ENCOUNTER — Other Ambulatory Visit: Payer: Medicaid Other

## 2019-04-14 ENCOUNTER — Other Ambulatory Visit: Payer: Self-pay | Admitting: Oncology

## 2019-04-14 ENCOUNTER — Ambulatory Visit: Payer: Medicaid Other

## 2019-04-14 ENCOUNTER — Telehealth: Payer: Self-pay | Admitting: *Deleted

## 2019-04-14 ENCOUNTER — Other Ambulatory Visit: Payer: Self-pay

## 2019-04-14 ENCOUNTER — Inpatient Hospital Stay (HOSPITAL_BASED_OUTPATIENT_CLINIC_OR_DEPARTMENT_OTHER): Payer: Medicaid Other | Admitting: Oncology

## 2019-04-14 ENCOUNTER — Encounter: Payer: Self-pay | Admitting: Oncology

## 2019-04-14 VITALS — BP 110/72 | HR 82 | Temp 98.1°F

## 2019-04-14 DIAGNOSIS — K529 Noninfective gastroenteritis and colitis, unspecified: Secondary | ICD-10-CM | POA: Diagnosis not present

## 2019-04-14 DIAGNOSIS — E860 Dehydration: Secondary | ICD-10-CM | POA: Diagnosis not present

## 2019-04-14 DIAGNOSIS — E876 Hypokalemia: Secondary | ICD-10-CM | POA: Diagnosis not present

## 2019-04-14 DIAGNOSIS — R531 Weakness: Secondary | ICD-10-CM

## 2019-04-14 DIAGNOSIS — C530 Malignant neoplasm of endocervix: Secondary | ICD-10-CM

## 2019-04-14 DIAGNOSIS — Z5111 Encounter for antineoplastic chemotherapy: Secondary | ICD-10-CM | POA: Diagnosis not present

## 2019-04-14 DIAGNOSIS — Z95828 Presence of other vascular implants and grafts: Secondary | ICD-10-CM

## 2019-04-14 DIAGNOSIS — R110 Nausea: Secondary | ICD-10-CM

## 2019-04-14 DIAGNOSIS — R197 Diarrhea, unspecified: Secondary | ICD-10-CM

## 2019-04-14 LAB — CBC WITH DIFFERENTIAL/PLATELET
Abs Immature Granulocytes: 0 10*3/uL (ref 0.00–0.07)
Basophils Absolute: 0 10*3/uL (ref 0.0–0.1)
Basophils Relative: 0 %
Eosinophils Absolute: 0 10*3/uL (ref 0.0–0.5)
Eosinophils Relative: 1 %
HCT: 25.8 % — ABNORMAL LOW (ref 36.0–46.0)
Hemoglobin: 8.6 g/dL — ABNORMAL LOW (ref 12.0–15.0)
Immature Granulocytes: 0 %
Lymphocytes Relative: 30 %
Lymphs Abs: 0.3 10*3/uL — ABNORMAL LOW (ref 0.7–4.0)
MCH: 34.3 pg — ABNORMAL HIGH (ref 26.0–34.0)
MCHC: 33.3 g/dL (ref 30.0–36.0)
MCV: 102.8 fL — ABNORMAL HIGH (ref 80.0–100.0)
Monocytes Absolute: 0 10*3/uL — ABNORMAL LOW (ref 0.1–1.0)
Monocytes Relative: 4 %
Neutro Abs: 0.7 10*3/uL — ABNORMAL LOW (ref 1.7–7.7)
Neutrophils Relative %: 65 %
Platelets: 143 10*3/uL — ABNORMAL LOW (ref 150–400)
RBC: 2.51 MIL/uL — ABNORMAL LOW (ref 3.87–5.11)
RDW: 14.1 % (ref 11.5–15.5)
WBC: 1.1 10*3/uL — CL (ref 4.0–10.5)
nRBC: 0 % (ref 0.0–0.2)

## 2019-04-14 LAB — COMPREHENSIVE METABOLIC PANEL
ALT: 61 U/L — ABNORMAL HIGH (ref 0–44)
AST: 63 U/L — ABNORMAL HIGH (ref 15–41)
Albumin: 3.5 g/dL (ref 3.5–5.0)
Alkaline Phosphatase: 250 U/L — ABNORMAL HIGH (ref 38–126)
Anion gap: 8 (ref 5–15)
BUN: 11 mg/dL (ref 6–20)
CO2: 25 mmol/L (ref 22–32)
Calcium: 8.6 mg/dL — ABNORMAL LOW (ref 8.9–10.3)
Chloride: 105 mmol/L (ref 98–111)
Creatinine, Ser: 0.8 mg/dL (ref 0.44–1.00)
GFR calc Af Amer: >60 mL/min (ref >60)
GFR calc non Af Amer: >60 mL/min (ref >60)
Glucose, Bld: 99 mg/dL (ref 70–99)
Potassium: 3.4 mmol/L — ABNORMAL LOW (ref 3.5–5.1)
Sodium: 138 mmol/L (ref 135–145)
Total Bilirubin: 0.7 mg/dL (ref 0.3–1.2)
Total Protein: 7 g/dL (ref 6.5–8.1)

## 2019-04-14 LAB — MAGNESIUM: Magnesium: 1.5 mg/dL — ABNORMAL LOW (ref 1.7–2.4)

## 2019-04-14 MED ORDER — SODIUM CHLORIDE 0.9% FLUSH
10.0000 mL | INTRAVENOUS | Status: DC | PRN
  Administered 2019-04-14: 10 mL via INTRAVENOUS
  Filled 2019-04-14: qty 10

## 2019-04-14 MED ORDER — DEXAMETHASONE SODIUM PHOSPHATE 10 MG/ML IJ SOLN
10.0000 mg | Freq: Once | INTRAMUSCULAR | Status: AC
  Administered 2019-04-14: 10 mg via INTRAVENOUS
  Filled 2019-04-14: qty 1

## 2019-04-14 MED ORDER — SODIUM CHLORIDE 0.9 % IV SOLN
Freq: Once | INTRAVENOUS | Status: AC
  Administered 2019-04-14: 11:00:00 via INTRAVENOUS
  Filled 2019-04-14: qty 250

## 2019-04-14 MED ORDER — SODIUM CHLORIDE 0.9 % IV SOLN: INTRAVENOUS | Status: DC

## 2019-04-14 MED ORDER — HEPARIN SOD (PORK) LOCK FLUSH 100 UNIT/ML IV SOLN
500.0000 [IU] | Freq: Once | INTRAVENOUS | Status: AC
  Administered 2019-04-14: 14:00:00 500 [IU] via INTRAVENOUS

## 2019-04-14 MED ORDER — POTASSIUM CHLORIDE 20 MEQ/100ML IV SOLN
20.0000 meq | Freq: Once | INTRAVENOUS | Status: AC
  Administered 2019-04-14: 20 meq via INTRAVENOUS

## 2019-04-14 MED ORDER — MAGNESIUM SULFATE 4 GM/100ML IV SOLN
4.0000 g | Freq: Once | INTRAVENOUS | Status: AC
  Administered 2019-04-14: 4 g via INTRAVENOUS
  Filled 2019-04-14: qty 100

## 2019-04-14 MED ORDER — SODIUM CHLORIDE 0.9 % IV SOLN: 4.0000 g | Freq: Once | INTRAVENOUS | Status: DC

## 2019-04-14 MED ORDER — SODIUM CHLORIDE 0.9 % IV SOLN: 10.0000 mg | Freq: Once | INTRAVENOUS | Status: DC

## 2019-04-14 NOTE — Telephone Encounter (Signed)
Patient called and would like to come in for IV fluids today even though she has appointment for blood transfusion tomorrow. States she has had a terrible weekend and feels dehydrated and does not want to wait until tomorrow. Please advise

## 2019-04-14 NOTE — Telephone Encounter (Signed)
Patient on way to office now

## 2019-04-14 NOTE — Telephone Encounter (Signed)
Please schedule a [facility] visit for labs and fluids. CBC, CMET, Mg. Thanks.

## 2019-04-14 NOTE — Progress Notes (Signed)
Symptom Management Consult note [facility]  [phone] [phone]  Patient Care Team: Arnetha Courser, MD as PCP - General (Family Medicine) Mellody Drown, MD as Consulting Physician (Obstetrics and Gynecology) Lenor Coffin, MD as Attending Physician (Gynecology) Lequita Asal, MD as Medical Oncologist (Hematology and Oncology) [name], MD as Consulting Physician (Gastroenterology) Michael Boston, MD as Consulting Physician (General Surgery) Lovell Sheehan, MD as Consulting Physician (Orthopedic Surgery) Clent Jacks, RN as Registered Nurse   Name of the patient: Joanna Hall  [id number]  08-Nov-1965   Date of visit: 04/14/2019   Diagnosis- Cervical Cancer   Chief complaint/ Reason for visit- Dehydration, diarrhea  Heme/Onc history:  Oncology History Overview Note  # dec 2017- CERVICAL ADENO CA [[place]]; Stage IB [Dr. Christene Slates at [facility] in [place], [place];No surgery- Chemo-RT;  # 2018-sep - lung nodules [in [place]]  # AUG 20th, 2019-#1 Carbo-Taxol s/p 6 cycles- [No avastin sec to blood clots]: Feb 2020-bilateral lung nodules with 1 cm in size. March 2020- finished carbo-taxol #7; poor tolerance to [place].  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  She received treatment from 11/2016-05/2017.  01/2017 cisplatin x 2 and carboplatin x 1 (01/29/2017) due to ARF and XRT.  XRT was followed by T & O on 02/01/2017 and T & N 02/10/2017 and 02/20/2017.   Course was complicated by 80 pound weight loss, nausea, vomiting, electrolyte wasting (potassium and magnesium).  She describes that.  Is been sick constantly requiring at least 20 hospitalizations.  Follow-up CT chest and PET on 06/2017. Per patient, 'radiation worked' and no disease in the abdomen.  At that time she was noted to have lung nodules that were growing and follow-up imaging was scheduled for 10/2017.  She was admitted to hospital in [place] for small bowel obstruction which was managed conservatively and she was home for a week prior to traveling to [place] for Thanksgiving holiday where she has family.  She presented to ER in [place] on 08/2017 with nausea, vomiting, and lower abdominal pain.  Symptoms did not respond to conservative treatment.  CT on 08/26/2017 revealed small bowel obstruction with transition in the pelvis just superior to the uterus rather was a long segment of distal ileum with fatty wall thickening compatible with chronic inflammation and/or radiation enteritis. Imaging showed numerous pulmonary nodules consistent with metastatic disease. She underwent laparotomy and right ileocolectomy on 08/31/2017 at [facility].  Surgical findings revealed a thickened, matted, and scarred piece of distal small bowel close to the ileocecal valve.  She was discharged on 09/05/2017.  Pain markedly increased in intensity and imaging was performed on 09/11/2017 which revealed: Debris within the anterior abdominal wall incision concerning for infection versus packing material, s/p post ileo-colectomy with expected postoperative changes, mild colonic ileus, numerous pulmonary nodules highly concerning for metastatic disease, punctate nonobstructing nephrolithiasis.  Staples were removed and one was packed.  She was started on doxycycline.  Abdominal and pelvic CT without contrast on 09/11/2017 revealed debris within anterior abdominal wall incision concerning for infection,  versus  packing material.She is s/pileocolectomy with expected postoperative changes and mild colonic ileus. There were numerous pulmonary nodules highly concerning for metastatic diseaseand punctate nonobstructing nephrolithiasis. She was readmitted on 09/12/2017. She describes the onset of lower abdominal pain on 09/09/2017. Pain markedly increased in intensity on 09/11/2017.  Staples were removed and the wound packed. She was started on doxycycline.  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  PET scan was ordered to evaluate enlarging lung nodules with concern for recurrent cervical cancer but scan was delayed due to insurance and need to be performed in [place].  Presented to ER on 12/03/2017 for abdominal pain and emesis.  Imaging concerning for worsening possible uterine infarct and she was admitted to hospital.  Pelvic MRI was unremarkable.  Remote scarring type changes of uterus thought to be possibly related to radiation.  She was discharged on 12/08/2017.  Underwent endoscopy and colonoscopy on 12/20/2017.    On 02/22/2018 she underwent laser ablation of condylomata around the anus and vagina under anesthesia with Dr. Johney Maine.   04/15/2018: Chest, abdomen, and pelvis CTrevealed innumerable (>100) cavitary nodules scattered in the lungs, moderately enlarging compared to the 11/08/2017 PET-CT, suspicious for metastatic disease. One index node in the RLL measures 1.0 x 1.1 cm (previously 0.6 x 0.6 cm). There were no new nodules. There was an ill-defined wall thickening in the rectosigmoid with  surrounding stranding along fascia planes, probably sequela from prior radiation therapy. There was multilevel lumbar impingement due to spondylosis and degenerative disc disease. There was heterogeneous enhancement in the uterus (some possibly from prior radiation therapy).  04/23/2018:PET scan revealednumerous scattered solid and cavitary nodules in the lungs stable increased in size compared to the prior PET-CT from 11/08/2017.Largest nodule was 1.1 cm in the LUL (SUV 1.9).These demonstratedlow-grade metabolic activity up to a maximum SUV of about 2.3, increased from 11/08/2017.   Case was discussed at tumor board on 04/25/2018. Consensus to pursue CT-guided biopsy (05/06/18) which revealed: Metastatic adenocarcinoma, morphologically consistent with cervical adenocarcinoma.  She has history of chronic hepatitis C which is managed by GI.  Hepatitis C genotype is 2a/2c.  She receives B12 injections for history of B12 deficiency.  On 04/24/2018 she underwent left total knee replacement with Dr. [name]. --------------------------------------------------------   DIAGNOSIS: Cervical adenocarcinoma  STAGE: IV        ;GOALS: Palliative  CURRENT/MOST RECENT THERAPY : Taxol single agent    Malignant neoplasm of overlapping sites of cervix ([place])  01/29/2019 -  Chemotherapy   The patient had PACLitaxel (TAXOL) 156 mg in sodium chloride 0.9 % 250 mL chemo infusion (</= 52m/m2), 80 mg/m2 = 156 mg, Intravenous,  Once, 3 of 4 cycles Dose modification: 65 mg/m2 (original dose 80 mg/m2, Cycle 1, Reason: Provider Judgment) Administration: 156 mg (01/29/2019), 156 mg (02/05/2019), 126 mg (02/12/2019), 126 mg (02/26/2019), 126 mg (03/13/2019), 126 mg (03/27/2019), 126 mg (03/20/2019), 126 mg (04/03/2019), 126 mg (04/10/2019)  for chemotherapy treatment.       Interval history-Elyza CVentolais a 53year old female who presents for evaluation of weakness and dehydration.  Onset was 2 days ago.  Symptoms have  progressed.  She describes frequent episodes of diarrhea  with incontinence.  Admits to compliance with Viberzi, Imodium, Lomotil and Bentyl.  She admits to right lower quadrant abdominal pain on Sunday.  She does not complain of abdominal pain today.  Has occasional nausea without vomiting.  Currently taking Zofran and Compazine as needed.  She denies any recent fevers or illnesses.  She denies easy bleeding, bruising, chest pain, constipation or shortness of breath.  ECOG FS:1 - Symptomatic but completely ambulatory  Review of systems- Review of Systems  Constitutional: Positive for malaise/fatigue. Negative for chills, fever and weight loss.  HENT: Negative for congestion, ear pain and tinnitus.   Eyes: Negative.  Negative for blurred vision and double vision.  Respiratory: Negative.  Negative for cough, sputum production and shortness of breath.   Cardiovascular: Negative.  Negative for chest pain, palpitations and leg swelling.  Gastrointestinal: Positive for abdominal pain, diarrhea and nausea. Negative for constipation and vomiting.  Genitourinary: Negative for dysuria, frequency and urgency.  Musculoskeletal: Positive for joint pain. Negative for back pain and falls.  Skin: Negative.  Negative for rash.  Neurological: Positive for weakness. Negative for headaches.  Endo/Heme/Allergies: Negative.  Does not bruise/bleed easily.  Psychiatric/Behavioral: Negative.  Negative for depression. The patient is not nervous/anxious and does not have insomnia.      Current treatment- S/p cycle 3 Single agent Taxol (D 1, 8 and 15)  Allergies  Allergen Reactions  . Ketamine Anxiety and Other (See Comments)    Syncope episode/confusion      Past Medical History:  Diagnosis Date  . Abdominal pain 06/10/2018  . Abnormal cervical Papanicolaou smear 09/18/2017  . Anxiety   . Aortic atherosclerosis ([place])   . Arthritis    neck and knees  . 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  Blood transfusion without reported diagnosis   . Cervical cancer ([place]) 09/2016   mets lung  . Chronic anal fissure   . Chronic diarrhea   . Dyspnea   . Erosive gastropathy 09/18/2017  . Factor V Leiden mutation ([place])   . Fecal incontinence   . Genital warts   . GERD (gastroesophageal reflux disease)   . Heart murmur   . Hematochezia   . Hemorrhoids   . 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 Hepatitis, chronic ([place]) 05/05/2017  . History of cancer chemotherapy    completed 06/2017  . History of Clostridium difficile infection    while undergoing chemo.  Negative test 10/2017  . Ileocolic anastomotic leak   . 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 Intestinal infection due to Clostridium difficile 09/18/2017  . Macrocytic anemia with vitamin B12 deficiency   . Multiple gastric ulcers   . Nausea vomiting and diarrhea   . Pancolitis ([place]) 07/27/2018  . Perianal condylomata   . Pneumonia    History of  . Pulmonary nodules   . Rectal bleeding   . Small bowel obstruction ([place]) 08/2017  . Stiff neck    limited right turn  . Vitamin D deficiency      Past Surgical History:  Procedure Laterality Date  . CHOLECYSTECTOMY    . COLON SURGERY  08/2017   resection  . COLONOSCOPY WITH PROPOFOL N/A 12/20/2017   Procedure: COLONOSCOPY WITH PROPOFOL;  Surgeon: [name], MD;  Location: [facility] ENDOSCOPY;  Service: Gastroenterology;  Laterality: N/A;  . COLONOSCOPY WITH PROPOFOL N/A 07/30/2018   Procedure: COLONOSCOPY WITH  PROPOFOL;  Surgeon: [name], MD;  Location: [facility] ENDOSCOPY;  Service: Gastroenterology;  Laterality: N/A;  . COLONOSCOPY WITH PROPOFOL N/A 10/10/2018   Procedure: COLONOSCOPY WITH PROPOFOL;  Surgeon: Lucilla Lame, MD;  Location: [facility] ENDOSCOPY;  Service: Endoscopy;  Laterality: N/A;  . DIAGNOSTIC LAPAROSCOPY    . ESOPHAGOGASTRODUODENOSCOPY (EGD) WITH PROPOFOL N/A 12/20/2017   Procedure: ESOPHAGOGASTRODUODENOSCOPY  (EGD) WITH PROPOFOL;  Surgeon: [name], MD;  Location: [place];  Service: Gastroenterology;  Laterality: N/A;  . ESOPHAGOGASTRODUODENOSCOPY (EGD) WITH PROPOFOL  07/30/2018   Procedure: ESOPHAGOGASTRODUODENOSCOPY (EGD) WITH PROPOFOL;  Surgeon: [name], MD;  Location: [facility] ENDOSCOPY;  Service: Gastroenterology;;  . Otho Darner SIGMOIDOSCOPY N/A 11/21/2018   Procedure: FLEXIBLE SIGMOIDOSCOPY;  Surgeon: [name], MD;  Location: [facility] ENDOSCOPY;  Service: Gastroenterology;  Laterality: N/A;  . LAPAROTOMY N/A 08/31/2017   Procedure: EXPLORATORY LAPAROTOMY for SBO, ileocolectomy, removal of piece of uterine wall;  Surgeon: [name], MD;  Location: [facility] ORS;  Service: General;  Laterality: N/A;  . LASER ABLATION CONDOLAMATA N/A 02/22/2018   Procedure: LASER ABLATION/REMOVAL OF IRSWNIOEVOJ AROUND ANUS AND VAGINA;  Surgeon: Michael Boston, MD;  Location: [place];  Service: General;  Laterality: N/A;  . OOPHORECTOMY    . PORTA CATH INSERTION N/A 05/13/2018   Procedure: PORTA CATH INSERTION;  Surgeon: Katha Cabal, MD;  Location: [place] CV LAB;  Service: Cardiovascular;  Laterality: N/A;  . SMALL INTESTINE SURGERY    . TANDEM RING INSERTION     x3  . THORACOTOMY    . TOTAL KNEE ARTHROPLASTY Left 04/24/2018   Procedure: TOTAL KNEE ARTHROPLASTY;  Surgeon: Lovell Sheehan, MD;  Location: [facility] ORS;  Service: Orthopedics;  Laterality: Left;    Social History   Socioeconomic History  . Marital status: Divorced    Spouse name: Not on file  . Number of children: Not on file  . Years of education: Not on file  . Highest education level: Not on file  Occupational History  . Not on file  Social Needs  . Financial resource strain: Not hard at all  . Food insecurity    Worry: Never true    Inability: Never true  . Transportation needs    Medical: No    Non-medical: No  Tobacco Use  . Smoking status: Former Smoker    Quit date:  10/16/2006    Years since quitting: 12.5  . Smokeless tobacco: Never Used  Substance and Sexual Activity  . Alcohol use: Not Currently    Frequency: Never    Comment: seldom  . Drug use: Yes    Types: Marijuana  . Sexual activity: Not Currently    Birth control/protection: Post-menopausal    Comment: Not Asked  Lifestyle  . Physical activity    Days per week: Patient refused    Minutes per session: Patient refused  . Stress: Only a little  Relationships  . Social Herbalist on phone: Patient refused    Gets together: Patient refused    Attends religious service: Patient refused    Active member of club or organization: Patient refused    Attends meetings of clubs or organizations: Patient refused    Relationship status: Patient refused  . Intimate partner violence    Fear of current or ex partner: No    Emotionally abused: No    Physically abused: No    Forced sexual activity: No  Other Topics Concern  . Not on file  Social History  Narrative  . Not on file    Family History  Problem Relation Age of Onset  . Hypertension Father   . Diabetes Father   . Alcohol abuse Daughter   . Hypertension Maternal Grandmother   . Diabetes Maternal Grandmother   . Diabetes Paternal Grandmother   . Hypertension Paternal Grandmother      Current Outpatient Medications:  .  amitriptyline (ELAVIL) 75 MG tablet, Take 1 tablet (75 mg total) by mouth at bedtime., Disp: 90 tablet, Rfl: 1 .  budesonide (ENTOCORT EC) 3 MG 24 hr capsule, Take 3 capsules (9 mg total) by mouth daily for 30 days., Disp: 90 capsule, Rfl: 2 .  Calcium Carb-Cholecalciferol (CALCIUM 500 +D) 500-400 MG-UNIT TABS, Take 2 tablets by mouth daily., Disp: , Rfl:  .  diphenoxylate-atropine (LOMOTIL) 2.5-0.025 MG tablet, TAKE 2 TABLETS BY MOUTH FOUR TIMES A DAY, Disp: 240 tablet, Rfl: 0 .  famotidine (PEPCID) 20 MG tablet, Take 1 tablet (20 mg total) by mouth 2 (two) times daily as needed for heartburn or  indigestion., Disp: , Rfl:  .  hyoscyamine (LEVBID) 0.375 MG 12 hr tablet, Take 1 tablet (0.375 mg total)by mouth 2 (two) times daily., Disp: 60 tablet, Rfl: 0 .  meclizine (ANTIVERT) 25 MG tablet, Take 1 tablet (25 mg total) by mouth 2 (two) times daily as needed for dizziness., Disp: 15 tablet, Rfl: 0 .  Multiple Vitamins-Minerals (MULTIVITAMIN WITH MINERALS) tablet, Take 1 tablet by mouth daily., Disp: 30 tablet, Rfl: 0 .  ondansetron (ZOFRAN ODT) 4 MG disintegrating tablet, Take 1 tablet (4 mg total) by mouth every 8 (eight) hours as needed for nausea or vomiting., Disp: 30 tablet, Rfl: 2 .  oxyCODONE (OXYCONTIN) 15 mg 12 hr tablet, Take 1 tablet (15 mg total) by mouth every 12 (twelve) hours., Disp: 30 tablet, Rfl: 0 .  Oxycodone HCl 10 MG TABS, Take 1 tablet (10 mg total) by mouth every 6 (six) hours as needed (for breakthrough pain)., Disp: 90 tablet, Rfl: 0 .  pantoprazole (PROTONIX) 40 MG tablet, Take 1 tablet (40 mg total) by mouth daily., Disp: 90 tablet, Rfl: 1 .  potassium chloride SA (K-DUR,KLOR-CON) 20 MEQ tablet, Take 1 tablet (20 mEq total) by mouth daily. (Patient taking differently: Take 20 mEq by mouth 2 (two) times daily. ), Disp: 30 tablet, Rfl: 3 .  promethazine (PHENERGAN) 25 MG tablet, Take 1 tablet (25 mg total) by mouth every 8 (eight) hours as needed for nausea or vomiting., Disp: 30 tablet, Rfl: 3 .  rivaroxaban (XARELTO) 20 MG TABS tablet, Take 1 tablet (20 mg total) by mouth daily with supper., Disp: 30 tablet, Rfl: 1 .  VENTOLIN HFA 108 (90 Base) MCG/ACT inhaler, Inhale 1-2 puffs into the lungs every 4 (four) hours as needed for shortness of breath., Disp: 1 Inhaler, Rfl: 1 .  VIBERZI 100 MG TABS, TAKE 1 TABLET BY MOUTH TWICE DAILY WITH A MEAL, Disp: 60 tablet, Rfl: 0 No current facility-administered medications for this visit.   Facility-Administered Medications Ordered in Other Visits:  .  heparin lock flush 100 unit/mL, 500 Units, Intravenous, Once, [place],  Melissa C, MD .  heparin lock flush 100 unit/mL, 500 Units, Intracatheter, Once PRN, Charlaine Dalton R, MD .  sodium chloride 0.9 % 1,000 mL with potassium chloride 20 mEq infusion, , Intravenous, Once, Honor Loh E, NP .  sodium chloride flush (NS) 0.9 % injection 10 mL, 10 mL, Intravenous, Once, Borders, Vonna Kotyk R, NP .  sodium chloride flush (NS)  0.9 % injection 10 mL, 10 mL, Intravenous, PRN, Faythe Casa E, NP, 10 mL at 04/14/19 1030  Physical exam:  Vitals:   04/14/19 1047  BP: 110/72  Pulse: 82  Temp: 98.1 F (36.7 C)  TempSrc: Tympanic   Physical Exam Constitutional:      Appearance: Normal appearance.  HENT:     Head: Normocephalic and atraumatic.  Eyes:     Pupils: Pupils are equal, round, and reactive to light.  Neck:     Musculoskeletal: Normal range of motion.  Cardiovascular:     Rate and Rhythm: Normal rate and regular rhythm.     Heart sounds: Normal heart sounds. No murmur.  Pulmonary:     Effort: Pulmonary effort is normal.     Breath sounds: Normal breath sounds. No wheezing.  Abdominal:     General: Bowel sounds are normal. There is no distension.     Palpations: Abdomen is soft.     Tenderness: There is no abdominal tenderness.  Musculoskeletal: Normal range of motion.  Skin:    General: Skin is warm and dry.     Coloration: Skin is pale.     Findings: No rash.  Neurological:     Mental Status: She is alert and oriented to person, place, and time.  Psychiatric:        Judgment: Judgment normal.      CMP Latest Ref Rng & Units 04/14/2019  Glucose 70 - 99 mg/dL 99  BUN 6 - 20 mg/dL 11  Creatinine 0.44 - 1.00 mg/dL 0.80  Sodium 135 - 145 mmol/L 138  Potassium 3.5 - 5.1 mmol/L 3.4(L)  Chloride 98 - 111 mmol/L 105  CO2 22 - 32 mmol/L 25  Calcium 8.9 - 10.3 mg/dL 8.6(L)  Total Protein 6.5 - 8.1 g/dL 7.0  Total Bilirubin 0.3 - 1.2 mg/dL 0.7  Alkaline Phos 38 - 126 U/L 250(H)  AST 15 - 41 U/L 63(H)  ALT 0 - 44 U/L 61(H)   CBC Latest Ref  Rng & Units 04/14/2019  WBC 4.0 - 10.5 K/uL 1.1(LL)  Hemoglobin 12.0 - 15.0 g/dL 8.6(L)  Hematocrit 36.0 - 46.0 % 25.8(L)  Platelets 150 - 400 K/uL 143(L)    No images are attached to the encounter.  No results found.   Assessment and plan- Patient is a 53 y.o. female who presents to [facility] for complaints of dehydration, weakness and increased diarrhea.  History of cervical cancer: Current treatment single agent Taxol status post cycle 3-day 15.  (01/29/2019 through current).  Tolerating better than combination therapy with carbo/Taxol.  Plan is for single agent Taxol 3 weeks on and one-week off.  Most recent imaging with CT abdomen pelvis completed on 01/23/2019 revealed stable disease without interval change.  Plan is to rescan on 04/21/2019.  Diarrhea: Recently ruled out infection.  Negative GI panel and C. Difficile.  This is likely her chronic diarrhea exacerbated by chemotherapy given last week.  Patient recently expressed interest about having a diverting colostomy placed given diarrhea is not improving and it may improve her quality of life.  Patient to speak with primary oncologist Dr. Rogue Bussing at their next visit for possible options.  Dehydration: Due to diarrhea.   Plan: Stat labs. 1 L NaCl. Replace electrolytes.  Give 20 M EQ potassium, 4 g magnesium IV Zofran. IV Decadron.  Disposition: Patient had scheduled appointment for tomorrow 04/15/2019 for possible blood given hemoglobin was trending down.  Will cancel this appointment.  Her hemoglobin is improved at  8.6. Return to clinic on 04/21/2019 for scheduled CT scan. Return to clinic on 04/24/2019 for CT scan results and MD assessment.   Visit Diagnosis 1. Chronic diarrhea   2. Dehydration   3. Hypokalemia   4. Weakness     Patient expressed understanding and was in agreement with this plan. She also understands that She can call clinic at any time with any questions, concerns, or complaints.   Greater than 50% was spent in  counseling and coordination of care with this patient including but not limited to discussion of the relevant topics above (See A&P) including, but not limited to diagnosis and management of acute and chronic medical conditions.   Thank you for allowing me to participate in the care of this very pleasant patient.    Jacquelin Hawking, NP [place] at [facility] Cell - [phone] Pager- [phone] 04/14/2019 3:55 PM

## 2019-04-15 ENCOUNTER — Inpatient Hospital Stay: Payer: Medicaid Other

## 2019-04-15 ENCOUNTER — Other Ambulatory Visit: Payer: Self-pay

## 2019-04-15 DIAGNOSIS — Z5111 Encounter for antineoplastic chemotherapy: Secondary | ICD-10-CM | POA: Diagnosis not present

## 2019-04-15 DIAGNOSIS — Z95828 Presence of other vascular implants and grafts: Secondary | ICD-10-CM

## 2019-04-15 MED ORDER — HEPARIN SOD (PORK) LOCK FLUSH 100 UNIT/ML IV SOLN
500.0000 [IU] | Freq: Once | INTRAVENOUS | Status: AC
  Administered 2019-04-15: 500 [IU] via INTRAVENOUS

## 2019-04-15 MED ORDER — SODIUM CHLORIDE 0.9% FLUSH
10.0000 mL | Freq: Once | INTRAVENOUS | Status: AC
  Administered 2019-04-15: 10 mL via INTRAVENOUS
  Filled 2019-04-15: qty 10

## 2019-04-16 ENCOUNTER — Encounter: Payer: Self-pay | Admitting: Oncology

## 2019-04-16 ENCOUNTER — Other Ambulatory Visit: Payer: Self-pay | Admitting: *Deleted

## 2019-04-16 MED ORDER — OXYCODONE HCL 10 MG PO TABS: 10.0000 mg | ORAL_TABLET | Freq: Four times a day (QID) | ORAL | 0 refills | Status: DC | PRN

## 2019-04-16 NOTE — Telephone Encounter (Signed)
Patient called cancer center requesting refill of Oxycodone 10 mg q 6 PRN for pain.    As mandated by the [place] STOP Act (Strengthen Opioid Misuse Prevention), the [place] Controlled Substance Reporting System ([place]) was reviewed for this patient. Below is the past 65-month of controlled substance prescriptions as displayed by the registry. I have personally consulted with my supervising physician, Dr. BRogue Bussingwho agrees that continuation of opiate therapy is medically appropriate at this time and agrees to provide continual monitoring, including urine/blood drug screens, as indicated. Refill is appropriate on or after 04/10/19.  NCCSRS reviewed: Reviewed and ok for refill.     JFaythe Casa NP 04/16/2019 2:56 PM [phone]

## 2019-04-21 ENCOUNTER — Other Ambulatory Visit: Payer: Self-pay | Admitting: Oncology

## 2019-04-21 ENCOUNTER — Other Ambulatory Visit: Payer: Self-pay | Admitting: *Deleted

## 2019-04-21 ENCOUNTER — Inpatient Hospital Stay: Payer: Medicaid Other | Attending: Internal Medicine

## 2019-04-21 ENCOUNTER — Other Ambulatory Visit: Payer: Self-pay | Admitting: Gastroenterology

## 2019-04-21 ENCOUNTER — Encounter: Payer: Self-pay | Admitting: Oncology

## 2019-04-21 ENCOUNTER — Other Ambulatory Visit: Payer: Self-pay | Admitting: Nurse Practitioner

## 2019-04-21 ENCOUNTER — Encounter: Payer: Self-pay | Admitting: Gastroenterology

## 2019-04-21 ENCOUNTER — Other Ambulatory Visit: Payer: Self-pay

## 2019-04-21 ENCOUNTER — Telehealth: Payer: Self-pay | Admitting: *Deleted

## 2019-04-21 ENCOUNTER — Ambulatory Visit
Admission: RE | Admit: 2019-04-21 | Discharge: 2019-04-21 | Disposition: A | Discharge status: Home / Self Care | Payer: Medicaid Other | Source: Ambulatory Visit | Attending: Internal Medicine | Admitting: Internal Medicine

## 2019-04-21 DIAGNOSIS — Z7901 Long term (current) use of anticoagulants: Secondary | ICD-10-CM | POA: Insufficient documentation

## 2019-04-21 DIAGNOSIS — C530 Malignant neoplasm of endocervix: Secondary | ICD-10-CM

## 2019-04-21 DIAGNOSIS — Z79899 Other long term (current) drug therapy: Secondary | ICD-10-CM | POA: Insufficient documentation

## 2019-04-21 DIAGNOSIS — Z90722 Acquired absence of ovaries, bilateral: Secondary | ICD-10-CM | POA: Insufficient documentation

## 2019-04-21 DIAGNOSIS — Z923 Personal history of irradiation: Secondary | ICD-10-CM | POA: Insufficient documentation

## 2019-04-21 DIAGNOSIS — Z86718 Personal history of other venous thrombosis and embolism: Secondary | ICD-10-CM | POA: Insufficient documentation

## 2019-04-21 DIAGNOSIS — R531 Weakness: Secondary | ICD-10-CM

## 2019-04-21 DIAGNOSIS — Z95828 Presence of other vascular implants and grafts: Secondary | ICD-10-CM

## 2019-04-21 DIAGNOSIS — G629 Polyneuropathy, unspecified: Secondary | ICD-10-CM | POA: Insufficient documentation

## 2019-04-21 DIAGNOSIS — Z9221 Personal history of antineoplastic chemotherapy: Secondary | ICD-10-CM | POA: Insufficient documentation

## 2019-04-21 DIAGNOSIS — E860 Dehydration: Secondary | ICD-10-CM

## 2019-04-21 DIAGNOSIS — Z5112 Encounter for antineoplastic immunotherapy: Secondary | ICD-10-CM | POA: Diagnosis present

## 2019-04-21 DIAGNOSIS — Z86711 Personal history of pulmonary embolism: Secondary | ICD-10-CM | POA: Diagnosis not present

## 2019-04-21 DIAGNOSIS — C539 Malignant neoplasm of cervix uteri, unspecified: Secondary | ICD-10-CM | POA: Insufficient documentation

## 2019-04-21 DIAGNOSIS — C7800 Secondary malignant neoplasm of unspecified lung: Secondary | ICD-10-CM | POA: Diagnosis not present

## 2019-04-21 DIAGNOSIS — C538 Malignant neoplasm of overlapping sites of cervix uteri: Secondary | ICD-10-CM

## 2019-04-21 DIAGNOSIS — K529 Noninfective gastroenteritis and colitis, unspecified: Secondary | ICD-10-CM

## 2019-04-21 DIAGNOSIS — E876 Hypokalemia: Secondary | ICD-10-CM

## 2019-04-21 LAB — COMPREHENSIVE METABOLIC PANEL
ALT: 29 U/L (ref 0–44)
AST: 30 U/L (ref 15–41)
Albumin: 3.5 g/dL (ref 3.5–5.0)
Alkaline Phosphatase: 135 U/L — ABNORMAL HIGH (ref 38–126)
Anion gap: 9 (ref 5–15)
BUN: 11 mg/dL (ref 6–20)
CO2: 22 mmol/L (ref 22–32)
Calcium: 8.7 mg/dL — ABNORMAL LOW (ref 8.9–10.3)
Chloride: 104 mmol/L (ref 98–111)
Creatinine, Ser: 0.73 mg/dL (ref 0.44–1.00)
GFR calc Af Amer: >60 mL/min (ref >60)
GFR calc non Af Amer: >60 mL/min (ref >60)
Glucose, Bld: 88 mg/dL (ref 70–99)
Potassium: 3.9 mmol/L (ref 3.5–5.1)
Sodium: 135 mmol/L (ref 135–145)
Total Bilirubin: 0.5 mg/dL (ref 0.3–1.2)
Total Protein: 6.7 g/dL (ref 6.5–8.1)

## 2019-04-21 LAB — CBC WITH DIFFERENTIAL/PLATELET
Abs Immature Granulocytes: 0.01 10*3/uL (ref 0.00–0.07)
Basophils Absolute: 0 10*3/uL (ref 0.0–0.1)
Basophils Relative: 0 %
Eosinophils Absolute: 0 10*3/uL (ref 0.0–0.5)
Eosinophils Relative: 1 %
HCT: 29 % — ABNORMAL LOW (ref 36.0–46.0)
Hemoglobin: 9.3 g/dL — ABNORMAL LOW (ref 12.0–15.0)
Immature Granulocytes: 1 %
Lymphocytes Relative: 16 %
Lymphs Abs: 0.3 10*3/uL — ABNORMAL LOW (ref 0.7–4.0)
MCH: 34.3 pg — ABNORMAL HIGH (ref 26.0–34.0)
MCHC: 32.1 g/dL (ref 30.0–36.0)
MCV: 107 fL — ABNORMAL HIGH (ref 80.0–100.0)
Monocytes Absolute: 0.2 10*3/uL (ref 0.1–1.0)
Monocytes Relative: 8 %
Neutro Abs: 1.7 10*3/uL (ref 1.7–7.7)
Neutrophils Relative %: 74 %
Platelets: 191 10*3/uL (ref 150–400)
RBC: 2.71 MIL/uL — ABNORMAL LOW (ref 3.87–5.11)
RDW: 15.3 % (ref 11.5–15.5)
WBC: 2.2 10*3/uL — ABNORMAL LOW (ref 4.0–10.5)
nRBC: 0 % (ref 0.0–0.2)

## 2019-04-21 LAB — MAGNESIUM: Magnesium: 1.5 mg/dL — ABNORMAL LOW (ref 1.7–2.4)

## 2019-04-21 MED ORDER — MAGNESIUM SULFATE 2 GM/50ML IV SOLN
2.0000 g | Freq: Once | INTRAVENOUS | Status: AC
  Administered 2019-04-21: 2 g via INTRAVENOUS
  Filled 2019-04-21: qty 50

## 2019-04-21 MED ORDER — SODIUM CHLORIDE 0.9 % IV SOLN
Freq: Once | INTRAVENOUS | Status: AC
  Administered 2019-04-21: 14:00:00 via INTRAVENOUS
  Filled 2019-04-21: qty 250

## 2019-04-21 MED ORDER — VIBERZI 100 MG PO TABS: 100.0000 mg | ORAL_TABLET | Freq: Two times a day (BID) | ORAL | 2 refills | Status: DC

## 2019-04-21 MED ORDER — SODIUM CHLORIDE 0.9% FLUSH
10.0000 mL | INTRAVENOUS | Status: DC | PRN
  Filled 2019-04-21: qty 10

## 2019-04-21 MED ORDER — HEPARIN SOD (PORK) LOCK FLUSH 100 UNIT/ML IV SOLN: 500.0000 [IU] | Freq: Once | INTRAVENOUS | Status: DC

## 2019-04-21 MED ORDER — IOHEXOL 300 MG/ML  SOLN
100.0000 mL | Freq: Once | INTRAMUSCULAR | Status: AC | PRN
  Administered 2019-04-21: 100 mL via INTRAVENOUS

## 2019-04-21 MED ORDER — OXYCODONE HCL ER 15 MG PO T12A: 15.0000 mg | EXTENDED_RELEASE_TABLET | Freq: Two times a day (BID) | ORAL | 0 refills | Status: DC

## 2019-04-21 NOTE — Telephone Encounter (Signed)
Patient called [facility] requesting refill of oxycodone ER.   As mandated by the [place] STOP Act (Strengthen Opioid Misuse Prevention), the [place] Controlled Substance Reporting System ([place]) was reviewed for this patient.  Below is the past 24-month of controlled substance prescriptions as displayed by the registry.  I have personally consulted with my supervising physician, Dr. BRogue Bussing who agrees that continuation of opiate therapy is medically appropriate at this time and agrees to provide continual monitoring, including urine/blood drug screens, as indicated. Prescription sent electronically using [employer] secure transmission to requested pharmacy.   NVaderReviewed & updated in [place]. Refill not appropriate until 04/25/2019.   LBeckey Rutter DNP, AGNP-C CPort [place]lowat ABapt[phone](work cell) [phone](office)

## 2019-04-21 NOTE — Telephone Encounter (Signed)
Patient going out of town and would like refill sent in for her Oxy 15 mg tabs

## 2019-04-21 NOTE — Telephone Encounter (Signed)
Patient called requesting IV fluids today. Appointment accepted for 130 after discussing with Lorretta Harp, NP

## 2019-04-22 ENCOUNTER — Encounter: Payer: Self-pay | Admitting: Gastroenterology

## 2019-04-22 ENCOUNTER — Ambulatory Visit (INDEPENDENT_AMBULATORY_CARE_PROVIDER_SITE_OTHER): Payer: Medicaid Other | Admitting: Gastroenterology

## 2019-04-22 VITALS — BP 111/66 | HR 95 | Temp 99.1°F | Resp 17 | Ht 67.5 in | Wt 171.2 lb

## 2019-04-22 DIAGNOSIS — K529 Noninfective gastroenteritis and colitis, unspecified: Secondary | ICD-10-CM

## 2019-04-22 NOTE — Progress Notes (Signed)
[name], MD [address]  [place]  [place], [place] [address]  Main: [phone]  Fax: [phone]    Gastroenterology Consultation  Referring Provider:     Arnetha Courser, MD Primary Care Physician:  Arnetha Courser, MD Primary Gastroenterologist:  Dr. [name] Reason for Consultation:     Chronic diarrhea        HPI:   Joanna Hall is a 53 y.o. female referred by Dr. Hampton Abbot for consultation & management of and chronic unexplained diarrhea. Patient has history of cervical cancer underwent chemotherapy XRT and brachytherapy, appears to be stable, closely followed by oncology at [facility]. She had small bowel obstruction in 08/2017, underwent exploratory laparoscopy with ileocolectomy and primary anastomosis. 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 Currently on Xarelto  Patient reports that since her bowel resection, she has been experiencing several episodes of nonbloody diarrhea, up to 12 times per day and night associated with urgency, nocturnal diarrhea as well as fecal incontinence. She does report diffuse lower abdominal cramps as well as intermittent gas/bloating.patient reports that she may have seen worms in her stools on several occasions. She does drink well water. She denies recent travel outside the [place] or recent use of antibiotics. She did have C. Difficile infection in the past when she was undergoing chemotherapy for cervical cancer. She had stool studies for C. Difficile and other GI pathogens including ova and parasites in 10/2017 by Dr. Hampton Abbot and they were negative.She has tried Imodium and Lomotil with no relief. Therefore she was referred to GI by Dr. Hampton Abbot for further management. Patient denies any particular relation to food. Prior to her surgery, patient reports having alternating episodes of  diarrhea and constipation associated with abdominal cramps and bloating. She would have up to 2-3 soft bowel movements daily but not this severe.her weight has been stable. She denies nausea, vomiting, fever, night sweats. She denies rectal bleeding. She is found to have macrocytic anemia which has been stable. She is not on any B12 or iron supplements.   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 She does not have any evidence of chronic liver disease.  Follow-up visit 12/12/2017 Patient was admitted to the hospital early this month secondary to lower abdominal pain, CT revealed mild rectosigmoid thickening. She reports that her diarrhea resolved after initiation of cholestyramine. She is currently taking 4 g twice daily. Having one bowel movement daily. She denies rectal bleeding. Her anal fissure had also significantly improved since control of her diarrhea. She has not received topical nitroglycerin ointment. However, she continues to have lower abdominal pain, constant with no relation to food or bowel movements. She tried dicyclomine which did not help. She is seeing Dr. Mike Gip for follow-up of lung nodules and is scheduled to have a CT scan in 02/2018. Her disease has not progressed to date. She also has B12 deficiency for which she is taking B12 injections. She stopped taking NSAIDs  Follow-up visit 02/11/2018 Patient was at her weight has been stable. She feels like she is dependent on Bentyl  and cholestyramine for her diarrhea and abdominal pain. Her diarrhea recurs if she does not take cholestyramine. She takes 2 packets daily which in fact leads to constipation. Bentyl helps with her chronic lower  abdominal cramps. Medication for hepatitis C treatment has been approved, and waiting for the medicine to be shipped. She underwent colonoscopy which showed anal condylomata.  Follow up visit 05/31/2018 Patient underwent left knee replacement at [facility] on 04/24/2018. She also had laser ablation of anal condylomata on 02/22/2018 by Dr. Johney Maine. Both procedures went well. 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 Patient was started on chemotherapy on 05/15/2018. She developed abdominal pain with nausea, vomiting and diarrhea for last 2 weeks. She underwent CT abdomen and pelvis which revealed thickening of the colon, underwent stool studies including C. Difficile and other GI pathogen panel which came back negative. Patient is started on ciprofloxacin 3 days ago. I got a call from Ander Purpura, NP from [place] yesterday about her ongoing diarrhea. I recommended to increase cholestyramine to 3 times daily and add Lomotil. Patient is here as a urgent follow-up of her diarrhea  She took third dose of cholestyramine and took 1 dose of Lomotil last night and she had 1 soft bowel movement this morning. She thinks her diarrhea has slowed down. He otherwise denies any other GI symptoms  Follow-up visit 07/09/2018 Restarted chemotherapy for cervical cancer. She reports that her diarrhea recurred after starting chemotherapy. She reports lower abdominal cramps as well. Currently, experiencing very loose, muddy like bowel movements several times a day and sometimes at night. She is on cholestyramine 2 packets daily, Imodium twice daily, Lomotil twice daily. She had repeat stool studies by Dr. Mike Gip on and negative for infection. Her weight has been stable, she denies nausea, appetite is intact. She was also  taking oral magnesium which has been just stopped  Follow-up visit 08/19/2018 Patient made an urgent visit today due to worsening diarrhea since starting antibiotics for dental infection.  She is currently on amoxicillin.  She reports that for the last 1 month her diarrhea has been intermittent, had good days.  She reports the diarrhea really got worse after starting antibiotics.  Her last chemo was about a month ago.  She is waiting for her WBC count to be improved before starting next cycle of chemo.  She is currently on colestipol 1 pill daily, Viberzi 100 mg twice daily, started 2 days ago, tincture of opium 3 times daily, Lomotil once daily, budesonide 3 mg 3 pills daily.  Patient was hospitalized to [facility] few weeks ago, underwent EGD and colonoscopy.  Colonoscopy was unremarkable including biopsies.  EGD revealed moderate villous blunting on duodenal biopsies. However her TTG IgA was negative in the past.  Therefore, I started her empirically on budesonide She is tearful due to ongoing diarrhea, incontinence  Follow-up visit 08/28/2018 Patient made an urgent visit to see me today at the request of her oncologist, Dr. Mike Gip because of low serum cortisol levels.  Her morning cortisol level was 2.7 mcg/dL on 08/20/2018.  Patient is in pleasant mood today as her diarrhea significantly improved.  Currently having upto 2, pudding like stools daily without nocturnal diarrhea.  She thinks adding amitriptyline definitely helped with her diarrhea as well as her mood.  She states that she is not "weepy" like she used to be.  Viberzi was  also a new medication that was added within last 10 days.  Currently, she is taking budesonide 9 mg daily, Viberzi 100 mg twice daily, Bentyl as needed, tincture of opium 3 times daily, colestipol with each meal.  She is not taking Lomotil regularly.  Planning to undergo fourth cycle of chemo in next 1 to 2 weeks.  She completed a course of antibiotics.  Follow-up visit  10/21/2018 Dr. Mike Gip requested to see this patient today due to ongoing lower abdominal pain associated with diarrhea.  Since last visit, patient underwent fifth cycle of chemotherapy in 09/2018.  Patient was recently hospitalized secondary to hematochezia 10/08/2018 to 09/16/18 and underwent colonoscopy which revealed few aphthae in the neo terminal ileum which were biopsied.  There was no presence of Crohn's disease.  Patient received 2 units of PRBCs in the hospital. The colon was normal.  Xarelto was held.  Patient was rehospitalized on 10/17/2018 after a CT of the abdomen pelvis which was concerning for small bowel obstruction.  The CT was originally ordered by her PCP due to vaginal bleeding.  Patient was evaluated by surgery who were not concerned about small bowel obstruction and was discharged home.  Patient reports having intermittent episodes of diarrhea associated with severe lower abdominal cramps and nausea.  She is taking colestipol 3 times a day, Viberzi 100 mg twice daily, budesonide 3 mg 3 times daily, hydrocortisone 5 mg 3 times a day and amitriptyline 50 mg daily.  She has appointment with endocrinology in 02/2019.  She reports having severe lower abdominal cramps when she has a bowel movement which causes nausea and emesis.  She is able to maintain weight.  She reports abdominal bloating as well.  She denies rectal bleeding or black-colored stools.  Her hemoglobin today at cancer center is 13.2.  Patient is tearful with ongoing diarrhea that has caused few episodes of fecal incontinence.  Follow-up visit 11/13/2018 Patient is seeing me at request of Dr. Mike Gip due to recent dark-colored stools and drop in hemoglobin.  Patient was seen by Dr.Cochran last week patient expressed concern that her stools were almost black.  And her hemoglobin dropped from 11.4 to 9.8.  Therefore, she held her Xarelto.  Patient also underwent complete dental extraction and recovering well.  Her hemoglobin today is  9.8.  Patient reports that her diarrhea is mostly intermittent at this time and she mostly has good days in a week.  She has been taking combination of several antidiarrheal medications.  She restarted tincture of opium as well.  She is curious to restart her chemo because she thinks her diarrhea will behave the same no matter she is on chemo or not and this has remained unchanged even when she was receiving chemo in [place].  She is waiting from her insurance to receive approval to see [facility] for second opinion.  She has appointment with endocrinology in May.  Patient gained about 6 pounds and seems to be in good spirits.  She is going to restart Xarelto today.  She reported that she had episodes of foamy, yellow foul-smelling stool on Sunday, but did not have any diarrhea since then.  She has seen Dr. Mike Gip today and stool studies were ordered.  Patient also tried octreotide for chronic diarrhea but did not seem to help  Follow-up visit 04/22/2019 Patient made an urgent visit to see me because of ongoing diarrhea and she wanted to discuss about colostomy due to frequent episodes of bowel incontinence which is impairing  her quality of life significantly.  She is currently on Lomotil, Viberzi and budesonide which modestly controlling her diarrhea.  She was on Lomotil in the past which she has not tried recently.  She has expressed concerns how she cannot go out and dine out or travel because of diarrhea which is unpredictable that has led to several accidents due to lack of bowel control.  She had extensive work-up and also has seen [facility] who did not recommend any further intervention other than empiric treatment with rifaximin mean which she did in the past as well for possible bacterial overgrowth.  Her weight has been stable.  She denies any rectal bleeding.  She is currently undergoing treatment for cervical cancer.  She is currently on Taxol only, had poor tolerance to carboplatin.  She is not  taking hydrocortisone, budesonide, colestipol, rifaximin mean at this time  She denies smoking or drinking alcohol.   NSAIDs: Has stopped taking NSAIDs  Antiplts/Anticoagulants/Anti thrombotics: on Xarelto for factor V bleeding mutation  GI Procedures:  Colonoscopy 10/10/2018 The perianal and digital rectal examinations were normal. There was evidence of a prior end-to-side ileo-colonic anastomosis in the transverse colon. This was patent and was characterized by healthy appearing mucosa. The anastomosis was traversed. 3 aphthous ulcers in the neoterminal ileum, biopsied DIAGNOSIS:  A. TERMINAL ILEUM, ULCER; COLD BIOPSY:  - ENTERIC MUCOSA WITH NORMAL VILLOUS ARCHITECTURE.  - INFLAMED SQUAMOUS MUCOSA.  - FRAGMENTS OF FIBRINOPURULENT ULCER DEBRIS.  - NEGATIVE FOR DYSPLASIA AND MALIGNANCY.  Immunohistochemical studies directed against HSV 1/2 and CMV, with  appropriately reactive controls, are negative. A GMS special stain is  negative for fungal organisms.   EGD and colonoscopy 07/30/2018 DIAGNOSIS:  A. DUODENUM; COLD BIOPSY:  - MODERATE DUODENOPATHY WITH VILLOUS BLUNTING, SEE COMMENT.   Comment:  The duodenal biopsies show shortened and shrunken villi with reactive  changes of surface epithelium. Intraepithelial lymphocytes are not  increased. Peptic duodenitis and medication effect should be considered.  The features are not typical for malabsorption, but clinical correlation  is recommended.   B. NEO-TERMINAL ILEUM; COLD BIOPSY:  - ILEAL MUCOSA WITH INTACT VILLI.  - NEGATIVE FOR ACTIVE INFLAMMATION, INTRAEPITHELIAL LYMPHOCYTOSIS,  INFECTIOUS AGENTS, AND GRANULOMAS.  - NEGATIVE FOR DYSPLASIA AND MALIGNANCY.   C. COLON; RANDOM COLD BIOPSY:  - NONSPECIFIC CRYPT HYPERPLASIA.  - NEGATIVE FOR ACTIVE INFLAMMATION AND MICROSCOPIC COLITIS.  - NEGATIVE FOR DYSPLASIA AND MALIGNANCY.   EGD 12/30/2017 - Normal duodenal bulb and second portion of the duodenum. -  Erythematous mucosa in the stomach. Biopsied. - Normal gastroesophageal junction and esophagus.  Colonoscopy 12/30/2017 - Perianal condylomata found on perianal exam. - Patent end-to-side ileo-colonic anastomosis, characterized by healthy appearing mucosa. - The examined portion of the ileum was normal. Biopsied. - Multiple non-bleeding colonic angioectasias. - Normal mucosa in the entire examined colon. Biopsied. DIAGNOSIS:  A. STOMACH, ANTRUM; COLD BIOPSY:  - MILD CHRONIC GASTRITIS AND REGENERATIVE/REPARATIVE CHANGE.  - NEGATIVE FOR H. PYLORI, DYSPLASIA AND MALIGNANCY.   B. STOMACH, BODY; COLD BIOPSY:  - MINIMAL CHRONIC GASTRITIS AND REGENERATIVE/REPARATIVE CHANGE.  - NEGATIVE FOR H. PYLORI, DYSPLASIA AND MALIGNANCY.   Note: The histologic findings in the antrum and body resemble reactive  gastropathy. Etiologies include drugs/chemical injury (NSAIDs vs.  other), bile reflux, and changes adjacent to an area of healing  ulceration. Clinical correlation with endoscopic findings is required.   C. TERMINAL ILEUM; COLD BIOPSY:  - PARTIALLY FRAGMENTED SMALL BOWEL MUCOSA WITH OVERALL INTACT VILLOUS  ARCHITECTURE.  - NEGATIVE FOR  INTRAEPITHELIAL LYMPHOCYTOSIS, DYSPLASIA AND MALIGNANCY.   D. RANDOM COLON; COLD BIOPSY:  - COLONIC MUCOSA NEGATIVE FOR MICROSCOPIC COLITIS, DYSPLASIA AND  MALIGNANCY.   Colonoscopy at [facility] 10/05/2016 Impression:           - One diminutive polyp in the cecum,                        removed with a cold biopsy forceps.                        Resected and retrieved.                       - One 5 mm polyp in the cecum,                        removed with a jumbo cold forceps.                        Resected and retrieved.                       - One 6 mm polyp in the cecum,                        removed with a hot snare. Resected                        and retrieved.                       - One 5 mm polyp in the descending                        colon,  removed with a jumbo cold                        forceps. Resected and retrieved. 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   Intestinal  infection due to Clostridium difficile 09/18/2017   Macrocytic anemia with vitamin B12 deficiency    Multiple gastric ulcers    Nausea vomiting and diarrhea    Pancolitis ([place]) 07/27/2018   Perianal condylomata    Pneumonia    History of   Pulmonary nodules    Rectal bleeding    Small bowel obstruction ([place]) 08/2017   Stiff neck    limited right turn   Vitamin D deficiency     Past Surgical History:  Procedure Laterality Date   CHOLECYSTECTOMY     COLON SURGERY  08/2017   resection   COLONOSCOPY WITH PROPOFOL N/A 12/20/2017    Procedure: COLONOSCOPY WITH PROPOFOL;  Surgeon: [name], MD;  Location: [place] [facility];  Service: Gastroenterology;  Laterality: N/A;   COLONOSCOPY WITH PROPOFOL N/A 07/30/2018   Procedure: COLONOSCOPY WITH PROPOFOL;  Surgeon: [name], MD;  Location: [facility] ENDOSCOPY;  Service: Gastroenterology;  Laterality: N/A;   COLONOSCOPY WITH PROPOFOL N/A 10/10/2018   Procedure: COLONOSCOPY WITH PROPOFOL;  Surgeon: Lucilla Lame, MD;  Location: [facility] ENDOSCOPY;  Service: Endoscopy;  Laterality: N/A;   DIAGNOSTIC LAPAROSCOPY     ESOPHAGOGASTRODUODENOSCOPY (EGD) WITH PROPOFOL N/A 12/20/2017   Procedure: ESOPHAGOGASTRODUODENOSCOPY (EGD) WITH PROPOFOL;  Surgeon: [name], MD;  Location: [facility] ENDOSCOPY;  Service: Gastroenterology;  Laterality: N/A;   ESOPHAGOGASTRODUODENOSCOPY (EGD) WITH PROPOFOL  07/30/2018   Procedure: ESOPHAGOGASTRODUODENOSCOPY (EGD) WITH PROPOFOL;  Surgeon: [name], MD;  Location: [facility] ENDOSCOPY;  Service: Gastroenterology;;   [place] N/A 11/21/2018   Procedure: FLEXIBLE SIGMOIDOSCOPY;  Surgeon: [name], MD;  Location: [facility] ENDOSCOPY;  Service: Gastroenterology;  Laterality: N/A;   LAPAROTOMY N/A 08/31/2017   Procedure: EXPLORATORY LAPAROTOMY for SBO, ileocolectomy, removal of piece of uterine wall;  Surgeon: [name], MD;  Location: [facility] ORS;  Service: General;  Laterality: N/A;   LASER ABLATION CONDOLAMATA N/A 02/22/2018   Procedure: LASER ABLATION/REMOVAL OF [id number] AROUND ANUS AND VAGINA;  Surgeon: Michael Boston, MD;  Location: [place];  Service: General;  Laterality: N/A;   OOPHORECTOMY     PORTA CATH INSERTION N/A 05/13/2018   Procedure: PORTA CATH INSERTION;  Surgeon: Katha Cabal, MD;  Location: [facility];  Service: Cardiovascular;  Laterality: N/A;   SMALL INTESTINE SURGERY     TANDEM RING INSERTION     x3   THORACOTOMY     TOTAL KNEE ARTHROPLASTY Left  04/24/2018   Procedure: TOTAL KNEE ARTHROPLASTY;  Surgeon: Lovell Sheehan, MD;  Location: [facility] ORS;  Service: Orthopedics;  Laterality: Left;    Current Outpatient Medications:    amitriptyline (ELAVIL) 75 MG tablet, Take 1 tablet (75 mg total) by mouth at bedtime., Disp: 90 tablet, Rfl: 1   Calcium Carb-Cholecalciferol (CALCIUM 500 +D) 500-400 MG-UNIT TABS, Take 2 tablets by mouth daily., Disp: , Rfl:    diphenoxylate-atropine (LOMOTIL) 2.5-0.025 MG tablet, TAKE (2) TABLETS FOUR TIMES DAILY., Disp: 240 tablet, Rfl: 0   Eluxadoline (VIBERZI) 100 MG TABS, Take 1 tablet (100 mg total) by mouth 2 (two) times daily., Disp: 60 tablet, Rfl: 2   famotidine (PEPCID) 20 MG tablet, Take 1 tablet (20 mg total) by mouth 2 (two) times daily as needed for heartburn or indigestion., Disp: , Rfl:    hyoscyamine (LEVBID) 0.375 MG 12 hr tablet, Take 1 tablet (0.375 mg total)by mouth 2 (two) times daily., Disp: 60 tablet, Rfl: 0   meclizine (ANTIVERT) 25 MG tablet, Take 1 tablet (25 mg total) by mouth 2 (  two) times daily as needed for dizziness., Disp: 15 tablet, Rfl: 0   Multiple Vitamins-Minerals (MULTIVITAMIN WITH MINERALS) tablet, Take 1 tablet by mouth daily., Disp: 30 tablet, Rfl: 0   ondansetron (ZOFRAN ODT) 4 MG disintegrating tablet, Take 1 tablet (4 mg total) by mouth every 8 (eight) hours as needed for nausea or vomiting., Disp: 30 tablet, Rfl: 2   [START ON 04/25/2019] oxyCODONE (OXYCONTIN) 15 mg 12 hr tablet, Take 1 tablet (15 mg total) by mouth every 12 (twelve) hours., Disp: 60 tablet, Rfl: 0   Oxycodone HCl 10 MG TABS, Take 1 tablet (10 mg total) by mouth every 6 (six) hours as needed (for breakthrough pain)., Disp: 90 tablet, Rfl: 0   pantoprazole (PROTONIX) 40 MG tablet, Take 1 tablet (40 mg total) by mouth daily., Disp: 90 tablet, Rfl: 1   potassium chloride SA (K-DUR,KLOR-CON) 20 MEQ tablet, Take 1 tablet (20 mEq total) by mouth daily. (Patient taking differently: Take 20 mEq by mouth 2  (two) times daily. ), Disp: 30 tablet, Rfl: 3   promethazine (PHENERGAN) 25 MG tablet, Take 1 tablet (25 mg total) by mouth every 8 (eight) hours as needed for nausea or vomiting., Disp: 30 tablet, Rfl: 3   rivaroxaban (XARELTO) 20 MG TABS tablet, Take 1 tablet (20 mg total) by mouth daily with supper., Disp: 30 tablet, Rfl: 1   VENTOLIN HFA 108 (90 Base) MCG/ACT inhaler, Inhale 1-2 puffs into the lungs every 4 (four) hours as needed for shortness of breath., Disp: 1 Inhaler, Rfl: 1 No current facility-administered medications for this visit.   Facility-Administered Medications Ordered in Other Visits:    heparin lock flush 100 unit/mL, 500 Units, Intravenous, Once, Corcoran, Melissa C, MD   heparin lock flush 100 unit/mL, 500 Units, Intracatheter, Once PRN, Charlaine Dalton R, MD   sodium chloride 0.9 % 1,000 mL with potassium chloride 20 mEq infusion, , Intravenous, Once, Honor Loh E, NP   sodium chloride flush (NS) 0.9 % injection 10 mL, 10 mL, Intravenous, Once, Borders, Kirt Boys, NP   Family History  Problem Relation Age of Onset   Hypertension Father    Diabetes Father    Alcohol abuse Daughter    Hypertension Maternal Grandmother    Diabetes Maternal Grandmother    Diabetes Paternal Grandmother    Hypertension Paternal Grandmother      Social History   Tobacco Use   Smoking status: Former Smoker    Quit date: 10/16/2006    Years since quitting: 12.5   Smokeless tobacco: Never Used  Substance Use Topics   Alcohol use: Not Currently    Frequency: Never    Comment: seldom   Drug use: Yes    Types: Marijuana    Allergies as of 04/22/2019 - Review Complete 04/22/2019  Allergen Reaction Noted   Ketamine Anxiety and Other (See Comments) 02/22/2017    Review of Systems:    All systems reviewed and negative except where noted in HPI.   Physical Exam:  BP 111/66 (BP Location: Left Arm, Patient Position: Sitting, Cuff Size: Large)    Pulse 95    Temp  99.1 F (37.3 C)    Resp 17    Ht 5' 7.5" (1.715 m)    Wt 171 lb 3.2 oz (77.7 kg)    BMI 26.42 kg/m  No LMP recorded. Patient is postmenopausal.  General:   Alert,  Well-developed, well-nourished, pleasant and cooperative in NAD Head:  Normocephalic and atraumatic. Eyes:  Sclera clear, no icterus.  Conjunctiva pink. Ears:  Normal auditory acuity. Nose:  No deformity, discharge, or lesions. Mouth:  No deformity or lesions,oropharynx pink & moist. Neck:  Supple; no masses or thyromegaly. Lungs:  Respirations even and unlabored.  Clear throughout to auscultation.   No wheezes, crackles, or rhonchi. No acute distress. Heart:  Regular rate and rhythm; no murmurs, clicks, rubs, or gallops. Abdomen:  Normal bowel sounds. Soft, nontender, and non-distended without masses, hepatosplenomegaly or hernias noted.  No guarding or rebound tenderness.   Rectal: Not performed Msk:  Symmetrical without gross deformities. Good, equal movement & strength bilaterally. Pulses:  Normal pulses noted. Extremities:  No clubbing or edema.  No cyanosis. Neurologic:  Alert and oriented x3;  grossly normal neurologically. Skin:  Intact without significant lesions or rashes. No jaundice. Psych:  Alert and cooperative.  Sad mood and affect.  Imaging Studies: reviewed  Assessment and Plan:   Joanna Hall is a 53 y.o. Caucasian female with history of chronic hepatitis C, treatment nave, stage IV cervical adenocarcinoma, status post chemotherapy XRT and brachytherapy, ongoing treatment at [facility], complicated by radiation-induced small bowel obstruction, S/P exploratory laparoscopy with ileocolectomy in 08/2017 seen in follow-up for chronic hepatitis C, chronic diarrhea  Chronic hep C: Genotype 2a/2c, VL 82,400  Treatment nave, plan is to initiate [place] for 8 weeks  Patient is currently undergoing chemotherapy for cervical cancer There is no evidence of chronic liver disease She had alkaline  phosphatase Isoenzymes and liver component is elevated, GGT 209, elevated Hepatitis B surface antigen negative HIV nonreactive hepatitis B surface antibody, core antibody negative Other secondary causes for elevated LFTs are unremarkable  Chronic nonbloody diarrhea: It has been quite challenging to treat her diarrhea despite trying combination of various antidiarrheal medications.  She has history of ileocolectomy. Pancreatic fecal elastase, fecal calprotectin are normal, CRP came back normal. No evidence of microscopic colitis. Worsening of diarrhea after chemotherapy, latest EGD revealed a moderate villous blunting on the duodenal biopsies.  Repeat colonoscopy was unremarkable for colonic pathology.  Latest colonoscopy revealed 3 aphthous ulcers in the neoterminal ileum with no evidence of chronicity.  These ulcers are nonspecific and unclear etiology. Morning serum cortisol levels came back low at 2.7 mcg/dL. 5HIAA normal, celiac serologies TTG and DGP negative.  Serum gastrin mildly elevated which is clinically insignificant. Chromogranin A and VIP levels returned normal.  Cross-sectional imaging has been unremarkable without evidence of space-occupying lesions in the GI tract. Her diarrhea has been transiently responding to the above regimen.  She reports that she feels good for 2 to 3 days then her diarrhea recurs with severe lower abdominal cramps associated with nausea. Due to low serum cortisol levels, I referred her to endocrine to evaluate for adrenal insufficiency. In the meantime, I started her on hydrocortisone 10 mg 2 times a day which she is no longer on it.  I deferred VCE due to concern for retained capsule given her history of small bowel obstruction, ex lap   -Advised her to restart tincture of opium every 4-6 hours -Continue amitriptyline to 75 mg at bedtime -Continue Viberzi 100 mg twice daily -Discontinue Lomotil -Patient will contact me via my chart if tincture of opium is  working or not.  She preferred to be seen by Dr. Hampton Abbot to evaluate for colostomy based on her response to tincture of opium  Chronic anal fissure and symptomatic hemorrhoids: - Her fissure has significantly improved - I will defer outpatient hemorrhoid ligation for now - she underwent  removal of anal condylomata  Follow up in 1 month or sooner  [name], MD

## 2019-04-23 ENCOUNTER — Other Ambulatory Visit: Payer: Self-pay

## 2019-04-24 ENCOUNTER — Inpatient Hospital Stay: Payer: Medicaid Other

## 2019-04-24 ENCOUNTER — Inpatient Hospital Stay (HOSPITAL_BASED_OUTPATIENT_CLINIC_OR_DEPARTMENT_OTHER): Payer: Medicaid Other | Admitting: Internal Medicine

## 2019-04-24 ENCOUNTER — Other Ambulatory Visit: Payer: Self-pay

## 2019-04-24 DIAGNOSIS — C7800 Secondary malignant neoplasm of unspecified lung: Secondary | ICD-10-CM

## 2019-04-24 DIAGNOSIS — Z86718 Personal history of other venous thrombosis and embolism: Secondary | ICD-10-CM

## 2019-04-24 DIAGNOSIS — Z9221 Personal history of antineoplastic chemotherapy: Secondary | ICD-10-CM | POA: Diagnosis not present

## 2019-04-24 DIAGNOSIS — D638 Anemia in other chronic diseases classified elsewhere: Secondary | ICD-10-CM

## 2019-04-24 DIAGNOSIS — Z923 Personal history of irradiation: Secondary | ICD-10-CM

## 2019-04-24 DIAGNOSIS — Z95828 Presence of other vascular implants and grafts: Secondary | ICD-10-CM

## 2019-04-24 DIAGNOSIS — Z90722 Acquired absence of ovaries, bilateral: Secondary | ICD-10-CM

## 2019-04-24 DIAGNOSIS — C538 Malignant neoplasm of overlapping sites of cervix uteri: Secondary | ICD-10-CM

## 2019-04-24 DIAGNOSIS — Z7901 Long term (current) use of anticoagulants: Secondary | ICD-10-CM

## 2019-04-24 DIAGNOSIS — G629 Polyneuropathy, unspecified: Secondary | ICD-10-CM

## 2019-04-24 DIAGNOSIS — C539 Malignant neoplasm of cervix uteri, unspecified: Secondary | ICD-10-CM | POA: Diagnosis not present

## 2019-04-24 DIAGNOSIS — Z86711 Personal history of pulmonary embolism: Secondary | ICD-10-CM

## 2019-04-24 DIAGNOSIS — Z79899 Other long term (current) drug therapy: Secondary | ICD-10-CM

## 2019-04-24 DIAGNOSIS — K922 Gastrointestinal hemorrhage, unspecified: Secondary | ICD-10-CM

## 2019-04-24 DIAGNOSIS — D649 Anemia, unspecified: Secondary | ICD-10-CM

## 2019-04-24 DIAGNOSIS — Z5112 Encounter for antineoplastic immunotherapy: Secondary | ICD-10-CM | POA: Diagnosis not present

## 2019-04-24 DIAGNOSIS — C530 Malignant neoplasm of endocervix: Secondary | ICD-10-CM

## 2019-04-24 LAB — COMPREHENSIVE METABOLIC PANEL
ALT: 28 U/L (ref 0–44)
AST: 25 U/L (ref 15–41)
Albumin: 3.5 g/dL (ref 3.5–5.0)
Alkaline Phosphatase: 139 U/L — ABNORMAL HIGH (ref 38–126)
Anion gap: 7 (ref 5–15)
BUN: 10 mg/dL (ref 6–20)
CO2: 25 mmol/L (ref 22–32)
Calcium: 8.7 mg/dL — ABNORMAL LOW (ref 8.9–10.3)
Chloride: 106 mmol/L (ref 98–111)
Creatinine, Ser: 0.59 mg/dL (ref 0.44–1.00)
GFR calc Af Amer: >60 mL/min (ref >60)
GFR calc non Af Amer: >60 mL/min (ref >60)
Glucose, Bld: 90 mg/dL (ref 70–99)
Potassium: 4 mmol/L (ref 3.5–5.1)
Sodium: 138 mmol/L (ref 135–145)
Total Bilirubin: 0.3 mg/dL (ref 0.3–1.2)
Total Protein: 6.7 g/dL (ref 6.5–8.1)

## 2019-04-24 LAB — CBC WITH DIFFERENTIAL/PLATELET
Abs Immature Granulocytes: 0.01 10*3/uL (ref 0.00–0.07)
Basophils Absolute: 0 10*3/uL (ref 0.0–0.1)
Basophils Relative: 1 %
Eosinophils Absolute: 0 10*3/uL (ref 0.0–0.5)
Eosinophils Relative: 1 %
HCT: 29.6 % — ABNORMAL LOW (ref 36.0–46.0)
Hemoglobin: 9.3 g/dL — ABNORMAL LOW (ref 12.0–15.0)
Immature Granulocytes: 1 %
Lymphocytes Relative: 36 %
Lymphs Abs: 0.7 10*3/uL (ref 0.7–4.0)
MCH: 33.8 pg (ref 26.0–34.0)
MCHC: 31.4 g/dL (ref 30.0–36.0)
MCV: 107.6 fL — ABNORMAL HIGH (ref 80.0–100.0)
Monocytes Absolute: 0.2 10*3/uL (ref 0.1–1.0)
Monocytes Relative: 12 %
Neutro Abs: 1 10*3/uL — ABNORMAL LOW (ref 1.7–7.7)
Neutrophils Relative %: 49 %
Platelets: 200 10*3/uL (ref 150–400)
RBC: 2.75 MIL/uL — ABNORMAL LOW (ref 3.87–5.11)
RDW: 15 % (ref 11.5–15.5)
WBC: 1.9 10*3/uL — ABNORMAL LOW (ref 4.0–10.5)
nRBC: 0 % (ref 0.0–0.2)

## 2019-04-24 LAB — MAGNESIUM: Magnesium: 1.6 mg/dL — ABNORMAL LOW (ref 1.7–2.4)

## 2019-04-24 LAB — SAMPLE TO BLOOD BANK

## 2019-04-24 MED ORDER — SODIUM CHLORIDE 0.9% FLUSH
10.0000 mL | Freq: Once | INTRAVENOUS | Status: AC
  Administered 2019-04-24: 10 mL via INTRAVENOUS
  Filled 2019-04-24: qty 10

## 2019-04-24 MED ORDER — HEPARIN SOD (PORK) LOCK FLUSH 100 UNIT/ML IV SOLN
500.0000 [IU] | Freq: Once | INTRAVENOUS | Status: AC
  Administered 2019-04-24: 500 [IU]
  Filled 2019-04-24: qty 5

## 2019-04-24 NOTE — Progress Notes (Signed)
[place] NOTE  Patient Care Team: Lada, Satira Anis, MD as PCP - General (Family Medicine) Mellody Drown, MD as Consulting Physician (Obstetrics and Gynecology) Lenor Coffin, MD as Attending Physician (Gynecology) Lequita Asal, MD as Medical Oncologist (Hematology and Oncology) [name], MD as Consulting Physician (Gastroenterology) Michael Boston, MD as Consulting Physician (General Surgery) Lovell Sheehan, MD as Consulting Physician (Orthopedic Surgery) Clent Jacks, RN as Registered Nurse  CHIEF COMPLAINTS/PURPOSE OF CONSULTATION: Cervical cancer  #  Oncology History Overview Note  # dec 2017- CERVICAL ADENO CA [[employer]]; Stage IB [Dr. Christene Slates at [facility] in [place], [place];No surgery- Chemo-RT;  # 2018-sep - lung nodules [in [place]]  # AUG 20th, 2019-#1 Carbo-Taxol s/p 6 cycles- [No avastin sec to blood clots]: Feb 2020-bilateral lung nodules with 1 cm in size. March 2020- finished carbo-taxol #7; poor tolerance to [place].  # April 15 th 2020- Start Taxol weekly x 3; one week OFF;  # July 2020-CT- bil cavitary lesions- slightly bigger by few mm/overall STABLE; CT a/p-NED. 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  She received treatment from 11/2016-05/2017.  01/2017 cisplatin x 2 and carboplatin x 1 (01/29/2017) due to ARF and XRT.  XRT was followed by T & O on 02/01/2017 and T & N 02/10/2017 and 02/20/2017.  Course was complicated by 80 pound weight loss, nausea, vomiting, electrolyte  wasting (potassium and magnesium).  She describes that.  Is been sick constantly requiring at least 20 hospitalizations.  Follow-up CT chest and PET on 06/2017. Per patient, 'radiation worked' and no disease in the abdomen.  At that time she was noted to have lung nodules that were growing and follow-up imaging was scheduled for 10/2017.  She was admitted to hospital in [place] for small bowel obstruction which was managed conservatively and she was home for a week prior to traveling to [place] for Thanksgiving holiday where she has family.  She presented to ER in [place] on 08/2017 with nausea, vomiting, and lower abdominal pain.  Symptoms did not respond to conservative treatment.   CT on 08/26/2017 revealed small bowel obstruction with transition in the pelvis just superior to the uterus rather was a long segment of distal ileum with fatty wall thickening compatible with chronic inflammation and/or radiation enteritis. Imaging showed numerous pulmonary nodules consistent with metastatic disease. She underwent laparotomy and right ileocolectomy on 08/31/2017 at [facility].  Surgical findings revealed a thickened, matted, and scarred piece of distal small bowel close to the ileocecal valve.  She was discharged on 09/05/2017.  Pain markedly increased in intensity and imaging was performed on 09/11/2017 which revealed: Debris within the anterior abdominal wall incision concerning for infection versus packing material, s/p post ileo-colectomy with expected postoperative changes, mild colonic ileus, numerous pulmonary nodules highly concerning for metastatic disease, punctate nonobstructing nephrolithiasis.  Staples were removed and one was packed.  She was started on doxycycline.  Abdominal and pelvic CT without contrast on 09/11/2017 revealed debris within anterior abdominal wall incision concerning for infection, versus packing material.   She is s/p ileocolectomy with expected postoperative  changes and mild colonic ileus.  There were numerous pulmonary nodules highly concerning for metastatic disease and punctate nonobstructing nephrolithiasis. She was readmitted on 09/12/2017.  She describes the onset of lower abdominal pain on 09/09/2017.  Pain markedly increased in intensity on 09/11/2017.   Staples were removed and the wound packed.  She was started on doxycycline.  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   PET scan was ordered to evaluate enlarging lung nodules with concern for recurrent cervical cancer but scan was delayed due to insurance and need to be performed in [place].  Presented to ER on 12/03/2017 for abdominal pain and emesis.  Imaging concerning for worsening possible uterine infarct and she was admitted to hospital.  Pelvic MRI was unremarkable.  Remote scarring type changes of uterus thought to be possibly related to radiation.  She was discharged on 12/08/2017.  Underwent endoscopy and colonoscopy on 12/20/2017.    On 02/22/2018 she underwent laser ablation of condylomata around the anus and vagina under anesthesia with Dr. Johney Maine.   04/15/2018:  Chest, abdomen, and pelvis CT revealed innumerable (> 100) cavitary nodules scattered in the lungs, moderately enlarging compared to the 11/08/2017 PET-CT, suspicious for metastatic disease.  One index node in the RLL measures 1.0 x 1.1 cm (previously 0.6 x 0.6 cm).  There were no new nodules.  There was an ill-defined wall thickening in the rectosigmoid with surrounding stranding along fascia planes, probably sequela from prior radiation therapy.   There was multilevel lumbar impingement due to spondylosis and degenerative disc disease.  There was heterogeneous enhancement in the uterus (some possibly from prior radiation therapy).   04/23/2018:  PET scan revealed numerous scattered solid and cavitary nodules in the lungs stable increased in size compared to the prior PET-CT from 11/08/2017. Largest nodule was 1.1 cm in the LUL (SUV 1.9).  These demonstrated low-grade metabolic activity up to a maximum SUV of about 2.3, increased from 11/08/2017.    Case was discussed at tumor board on 04/25/2018. Consensus to pursue CT-guided biopsy (05/06/18) which revealed: Metastatic adenocarcinoma, morphologically consistent with cervical adenocarcinoma.  She has history of chronic hepatitis C which is managed by GI.  Hepatitis C genotype is 2a/2c.  She receives B12 injections for history of B12 deficiency.  On 04/24/2018 she underwent left total knee replacement with Dr. [name]. --------------------------------------------------------   DIAGNOSIS: Cervical adenocarcinoma  STAGE: IV        ;GOALS: Palliative  CURRENT/MOST RECENT THERAPY : Taxol single agent    Malignant neoplasm of overlapping sites of cervix ([place])  01/29/2019 - 04/23/2019 Chemotherapy   The patient had PACLitaxel (TAXOL) 156 mg in sodium chloride 0.9 % 250 mL chemo infusion (</= 62m/m2), 80 mg/m2 = 156 mg, Intravenous,  Once, 3 of 4 cycles Dose modification: 65 mg/m2 (original dose 80 mg/m2, Cycle 1, Reason: Provider Judgment) Administration: 156 mg (01/29/2019), 156 mg (02/05/2019), 126 mg (02/12/2019), 126 mg (02/26/2019), 126 mg (03/13/2019), 126 mg (03/27/2019), 126 mg (03/20/2019), 126 mg (04/03/2019), 126 mg (04/10/2019)  for chemotherapy treatment.    04/24/2019 -  Chemotherapy   The patient had pembrolizumab (KEYTRUDA) 200 mg in sodium chloride 0.9 % 50  mL chemo infusion, 200 mg, Intravenous, Once, 0 of 6 cycles  for chemotherapy treatment.       HISTORY OF PRESENTING ILLNESS:   Joanna Hall 53 y.o.  female with a history of adenocarcinoma the cervix with metastasis to the lung is currently on weekly Taxol/is here today with results of her restaging CAT scans.  Patient continues to be elevated and symptom management clinic on a weekly basis for ongoing diarrhea dehydration/poor p.o. intake.  She states her diarrhea is quite profuse for the last many weeks-to the point that it is embarrassing for her causing significant impact on her lifestyle.  Complains of stool incontinence.  Today she feels improved.  No nausea vomiting.  Chronic abdominal pain not any worse.   Review of Systems  Constitutional: Positive for malaise/fatigue. Negative for chills, diaphoresis, fever and weight loss.  HENT: Negative for nosebleeds and sore throat.   Eyes: Negative for double vision.  Respiratory: Negative for cough, hemoptysis, sputum production, shortness of breath and wheezing.   Cardiovascular: Negative for chest pain, palpitations, orthopnea and leg swelling.  Gastrointestinal: Positive for abdominal pain and diarrhea. Negative for blood in stool, constipation, heartburn, melena, nausea and vomiting.  Genitourinary: Negative for dysuria, frequency and urgency.  Musculoskeletal: Negative for back pain and joint pain.  Skin: Negative.  Negative for itching and rash.  Neurological: Positive for tingling. Negative for dizziness, focal weakness, weakness and headaches.  Endo/Heme/Allergies: Does not bruise/bleed easily.  Psychiatric/Behavioral: Negative for depression. The patient is not nervous/anxious and does not have insomnia.      MEDICAL HISTORY:  Past Medical History:  Diagnosis Date  . Abdominal pain 06/10/2018  . Abnormal cervical Papanicolaou smear 09/18/2017  . Anxiety   . Aortic atherosclerosis ([place])   . Arthritis    neck and knees  . 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 Blood transfusion without reported diagnosis   . Cervical cancer  ([place]) 09/2016   mets lung  . Chronic anal fissure   . Chronic diarrhea   . Dyspnea   . Erosive gastropathy 09/18/2017  . Factor V Leiden mutation ([place])   . Fecal incontinence   . Genital warts   . GERD (gastroesophageal reflux disease)   . Heart murmur   . Hematochezia   . Hemorrhoids   . 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 Hepatitis, chronic ([place]) 05/05/2017  . History of cancer chemotherapy    completed 06/2017  . History of Clostridium difficile infection    while undergoing chemo.  Negative test 10/2017  . Ileocolic anastomotic leak   . 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 Intestinal infection due to Clostridium difficile 09/18/2017  . Macrocytic anemia with vitamin B12 deficiency   . Multiple gastric ulcers   . Nausea vomiting and diarrhea   . Pancolitis ([place]) 07/27/2018  . Perianal condylomata   . Pneumonia    History of  . Pulmonary nodules   . Rectal bleeding   . Small bowel obstruction ([place]) 08/2017  . Stiff neck    limited right turn  . Vitamin D deficiency     SURGICAL HISTORY: Past Surgical History:  Procedure Laterality Date  . CHOLECYSTECTOMY    . COLON SURGERY  08/2017   resection  . COLONOSCOPY WITH PROPOFOL N/A 12/20/2017   Procedure: COLONOSCOPY WITH PROPOFOL;  Surgeon: [name], MD;  Location: [place];  Service: Gastroenterology;  Laterality: N/A;  . COLONOSCOPY WITH PROPOFOL N/A 07/30/2018   Procedure: COLONOSCOPY WITH PROPOFOL;  Surgeon: [name], MD;  Location: [facility] ENDOSCOPY;  Service: Gastroenterology;  Laterality: N/A;  . COLONOSCOPY WITH PROPOFOL N/A 10/10/2018   Procedure: COLONOSCOPY WITH PROPOFOL;  Surgeon: Lucilla Lame, MD;  Location: [facility] ENDOSCOPY;  Service: Endoscopy;  Laterality: N/A;  . DIAGNOSTIC LAPAROSCOPY    . ESOPHAGOGASTRODUODENOSCOPY (EGD) WITH PROPOFOL N/A 12/20/2017   Procedure: ESOPHAGOGASTRODUODENOSCOPY (EGD) WITH PROPOFOL;  Surgeon: [name], MD;  Location: [place];  Service: Gastroenterology;  Laterality: N/A;  . ESOPHAGOGASTRODUODENOSCOPY (EGD) WITH PROPOFOL  07/30/2018   Procedure: ESOPHAGOGASTRODUODENOSCOPY (EGD) WITH PROPOFOL;  Surgeon: [name], MD;  Location: [facility] ENDOSCOPY;  Service: Gastroenterology;;  . Otho Darner SIGMOIDOSCOPY N/A 11/21/2018   Procedure: FLEXIBLE SIGMOIDOSCOPY;  Surgeon: [name], MD;  Location: [facility] ENDOSCOPY;  Service: Gastroenterology;  Laterality: N/A;  . LAPAROTOMY N/A 08/31/2017   Procedure: EXPLORATORY LAPAROTOMY for SBO, ileocolectomy, removal of piece of uterine wall;  Surgeon: [name], MD;  Location: [facility] ORS;  Service: General;  Laterality: N/A;  . LASER ABLATION CONDOLAMATA N/A 02/22/2018   Procedure: LASER ABLATION/REMOVAL OF [id number] AROUND ANUS AND VAGINA;  Surgeon: Michael Boston, MD;  Location: [place];  Service: General;  Laterality: N/A;  . OOPHORECTOMY    . PORTA CATH INSERTION N/A 05/13/2018   Procedure: PORTA CATH INSERTION;  Surgeon: Katha Cabal, MD;  Location: [place] CV LAB;  Service: Cardiovascular;  Laterality: N/A;  . SMALL INTESTINE SURGERY    . TANDEM RING INSERTION     x3  . THORACOTOMY    . TOTAL KNEE ARTHROPLASTY Left 04/24/2018   Procedure: TOTAL KNEE ARTHROPLASTY;  Surgeon: Lovell Sheehan, MD;  Location: [facility] ORS;  Service: Orthopedics;  Laterality: Left;    SOCIAL HISTORY: Social History   Socioeconomic History  . Marital status: Divorced    Spouse name: Not on file  . Number of children: Not on file  . Years of education: Not on file  . Highest education level: Not on file  Occupational History  . Not on file  Social Needs  . Financial resource strain: Not hard at all  . Food insecurity    Worry: Never true    Inability: Never true  . Transportation needs    Medical: No    Non-medical: No  Tobacco Use  . Smoking status: Former Smoker    Quit date: 10/16/2006    Years since quitting:  12.5  . Smokeless tobacco: Never Used  Substance and Sexual Activity  . Alcohol use: Not Currently    Frequency: Never    Comment: seldom  . Drug use: Yes    Types: Marijuana  . Sexual activity: Not Currently    Birth control/protection: Post-menopausal    Comment: Not Asked  Lifestyle  . Physical activity    Days per week: Patient refused    Minutes per session: Patient refused  . Stress: Only a little  Relationships  . Social Herbalist on phone: Patient refused    Gets together: Patient refused    Attends religious service: Patient refused    Active member of club or organization: Patient refused    Attends meetings of clubs or organizations: Patient refused    Relationship status: Patient refused  . Intimate partner violence    Fear of current or ex partner: No    Emotionally abused: No    Physically abused: No  Forced sexual activity: No  Other Topics Concern  . Not on file  Social History Narrative  . Not on file    FAMILY HISTORY: Family History  Problem Relation Age of Onset  . Hypertension Father   . Diabetes Father   . Alcohol abuse Daughter   . Hypertension Maternal Grandmother   . Diabetes Maternal Grandmother   . Diabetes Paternal Grandmother   . Hypertension Paternal Grandmother     ALLERGIES:  is allergic to ketamine.  MEDICATIONS:  Current Outpatient Medications  Medication Sig Dispense Refill  . amitriptyline (ELAVIL) 75 MG tablet Take 1 tablet (75 mg total) by mouth at bedtime. 90 tablet 1  . Calcium Carb-Cholecalciferol (CALCIUM 500 +D) 500-400 MG-UNIT TABS Take 2 tablets by mouth daily.    . diphenoxylate-atropine (LOMOTIL) 2.5-0.025 MG tablet TAKE (2) TABLETS FOUR TIMES DAILY. 240 tablet 0  . Eluxadoline (VIBERZI) 100 MG TABS Take 1 tablet (100 mg total) by mouth 2 (two) times daily. 60 tablet 2  . famotidine (PEPCID) 20 MG tablet Take 1 tablet (20 mg total) by mouth 2 (two) times daily as needed for heartburn or indigestion.     . hyoscyamine (LEVBID) 0.375 MG 12 hr tablet Take 1 tablet (0.375 mg total)by mouth 2 (two) times daily. 60 tablet 0  . meclizine (ANTIVERT) 25 MG tablet Take 1 tablet (25 mg total) by mouth 2 (two) times daily as needed for dizziness. 15 tablet 0  . Multiple Vitamins-Minerals (MULTIVITAMIN WITH MINERALS) tablet Take 1 tablet by mouth daily. 30 tablet 0  . ondansetron (ZOFRAN ODT) 4 MG disintegrating tablet Take 1 tablet (4 mg total) by mouth every 8 (eight) hours as needed for nausea or vomiting. 30 tablet 2  . [START ON 04/25/2019] oxyCODONE (OXYCONTIN) 15 mg 12 hr tablet Take 1 tablet (15 mg total) by mouth every 12 (twelve) hours. 60 tablet 0  . Oxycodone HCl 10 MG TABS Take 1 tablet (10 mg total) by mouth every 6 (six) hours as needed (for breakthrough pain). 90 tablet 0  . pantoprazole (PROTONIX) 40 MG tablet Take 1 tablet (40 mg total) by mouth daily. 90 tablet 1  . potassium chloride SA (K-DUR,KLOR-CON) 20 MEQ tablet Take 1 tablet (20 mEq total) by mouth daily. (Patient taking differently: Take 20 mEq by mouth 2 (two) times daily. ) 30 tablet 3  . promethazine (PHENERGAN) 25 MG tablet Take 1 tablet (25 mg total) by mouth every 8 (eight) hours as needed for nausea or vomiting. 30 tablet 3  . rivaroxaban (XARELTO) 20 MG TABS tablet Take 1 tablet (20 mg total) by mouth daily with supper. 30 tablet 1  . VENTOLIN HFA 108 (90 Base) MCG/ACT inhaler Inhale 1-2 puffs into the lungs every 4 (four) hours as needed for shortness of breath. 1 Inhaler 1   No current facility-administered medications for this visit.    Facility-Administered Medications Ordered in Other Visits  Medication Dose Route Frequency Provider Last Rate Last Dose  . heparin lock flush 100 unit/mL  500 Units Intravenous Once Corcoran, Melissa C, MD      . sodium chloride 0.9 % 1,000 mL with potassium chloride 20 mEq infusion   Intravenous Once Honor Loh E, NP      . sodium chloride flush (NS) 0.9 % injection 10 mL  10 mL  Intravenous Once Borders, Kirt Boys, NP          .  PHYSICAL EXAMINATION: ECOG PERFORMANCE STATUS: 1 - Symptomatic but completely ambulatory  Vitals:  04/24/19 0845  BP: 128/79  Pulse: 75  Resp: 18  Temp: (!) 95.8 F (35.4 C)   Filed Weights   04/24/19 0845  Weight: 175 lb (79.4 kg)    Physical Exam  Constitutional: She is oriented to person, place, and time and well-developed, well-nourished, and in no distress.  Alone.  Walking herself.   HENT:  Head: Normocephalic and atraumatic.  Mouth/Throat: Oropharynx is clear and moist. No oropharyngeal exudate.  Eyes: Pupils are equal, round, and reactive to light.  Neck: Normal range of motion. Neck supple.  Cardiovascular: Normal rate and regular rhythm.  Pulmonary/Chest: Effort normal and breath sounds normal. No respiratory distress. She has no wheezes.  Abdominal: Soft. Bowel sounds are normal. She exhibits no distension and no mass. There is no rebound and no guarding.  Musculoskeletal: Normal range of motion.        General: No tenderness or edema.  Neurological: She is alert and oriented to person, place, and time.  Skin: Skin is warm.  Psychiatric: Affect normal.     LABORATORY DATA:  I have reviewed the data as listed Lab Results  Component Value Date   WBC 1.9 (L) 04/24/2019   HGB 9.3 (L) 04/24/2019   HCT 29.6 (L) 04/24/2019   MCV 107.6 (H) 04/24/2019   PLT 200 04/24/2019   Recent Labs    06/11/18 0510  07/08/18 1035  10/21/18 0845  04/14/19 1035 04/21/19 1335 04/24/19 0823  NA 135   < > 136   < > 134*   < > 138 135 138  K 3.9   < > 4.2   < > 3.9   < > 3.4* 3.9 4.0  CL 102   < > 107   < > 98   < > 105 104 106  CO2 27   < > 22   < > 24   < > 25 22 25   GLUCOSE 82   < > 119*   < > 111*   < > 99 88 90  BUN 11   < > 15   < > 17   < > 11 11 10   CREATININE 0.63   < > 0.71   < > 0.91   < > 0.80 0.73 0.59  CALCIUM 8.9   < > 8.7*   < > 9.6   < > 8.6* 8.7* 8.7*  GFRNONAA >60   < > >60   < > >60   < > >60 >60  >60  GFRAA >60   < > >60   < > >60   < > >60 >60 >60  PROT 7.0   < > 6.5   < > 8.6*   < > 7.0 6.7 6.7  ALBUMIN 3.5   < > 3.4*   < > 4.4   < > 3.5 3.5 3.5  AST 41   < > 26   < > 48*   < > 63* 30 25  ALT 51*   < > 33   < > 67*   < > 61* 29 28  ALKPHOS 171*   < > 132*   < > 185*   < > 250* 135* 139*  BILITOT 0.7   < > 0.4   < > 1.4*   < > 0.7 0.5 0.3  BILIDIR 0.1  --  <0.1  --  0.3*  --   --   --   --   IBILI 0.6  --  NOT CALCULATED  --   --   --   --   --   --    < > =  values in this interval not displayed.    RADIOGRAPHIC STUDIES: I have personally reviewed the radiological images as listed and agreed with the findings in the report. Ct Chest W Contrast  Result Date: 04/21/2019 CLINICAL DATA:  Cervical cancer with lung metastasis. Left chest pain and right lower quadrant pain. Metastatic cervical cancer. Nausea vomiting and diarrhea. EXAM: CT CHEST, ABDOMEN, AND PELVIS WITH CONTRAST TECHNIQUE: Multidetector CT imaging of the chest, abdomen and pelvis was performed following the standard protocol during bolus administration of intravenous contrast. CONTRAST:  129m OMNIPAQUE IOHEXOL 300 MG/ML  SOLN COMPARISON:  Abdominopelvic CT 01/23/2019. Most recent chest CT 09/30/2018. FINDINGS: CT CHEST FINDINGS Cardiovascular: Aortic atherosclerosis. Normal heart size, without pericardial effusion. Right Port-A-Cath tip at high right atrium. No central pulmonary embolism, on this non-dedicated study. Mediastinum/Nodes: No supraclavicular adenopathy. No mediastinal or hilar adenopathy. Lungs/Pleura: No pleural fluid. Bilateral pulmonary nodules, many of which are cavitary. Comparing the nodules at the lung bases to the 01/23/2019 exam, an index right lung base 12 mm nasion on image 92/4 measured 11 mm on the prior exam (when remeasured). Index left lower lobe pulmonary nodule measures 9 mm on 115/4 versus 8 mm on the prior. A right middle lobe pulmonary nodule measures 8 mm on image 88/4 versus 7 mm on 01/23/2019.  Musculoskeletal: No acute osseous abnormality. CT ABDOMEN PELVIS FINDINGS Hepatobiliary: No focal liver lesion. Cholecystectomy with similar biliary duct dilatation, with the common duct measuring 1.6 cm in the porta hepatis just above the pancreatic head today versus similar to on the prior (when remeasured). No obstructive stone or mass. Pancreas: Normal, without mass or ductal dilatation. Spleen: Normal in size, without focal abnormality. Adrenals/Urinary Tract: Normal adrenal glands. 3 mm inter/lower pole right renal collecting system calculus. Suspect a punctate interpolar left renal collecting system stone. Normal urinary bladder. Stomach/Bowel: Normal stomach, without wall thickening. The rectum and sigmoid demonstrate mild mucosal hyperenhancement, including on image 102/2. Normal terminal ileum. Normal small bowel. Vascular/Lymphatic: Aortic and branch vessel atherosclerosis. Small abdominal retroperitoneal nodes are not pathologic by size criteria and felt to be similar. index gastrohepatic ligament node is unchanged at 12 mm. No abdominopelvic adenopathy. Reproductive: Normal uterus and adnexa. Other: No significant free fluid. No evidence of omental or peritoneal disease. Presacral soft tissue thickening is similar. No significant free fluid. Musculoskeletal: Mild osteopenia. Lumbosacral spondylosis which is age advanced. Convex left lumbar spine curvature. IMPRESSION: CT CHEST IMPRESSION 1. Since the abdominal CT of 01/23/2019, mild progression of pulmonary metastasis. 2. No thoracic adenopathy. 3.  Aortic Atherosclerosis (ICD10-I70.0).  This is age advanced. CT ABDOMEN AND PELVIS IMPRESSION 1.  No  evidence of metastatic disease in the abdomen or pelvis. 2. Cholecystectomy with similar moderate common duct dilatation, without cause identified. 3. Presacral soft tissue thickening is unchanged and likely radiation induced. There is rectosigmoid mucosal hyperenhancement which is likely also radiation  induced. Acute infectious or inflammatory colitis cannot be excluded. 4. Right and probable left nephrolithiasis. Electronically Signed   By: KAbigail MiyamotoM.D.   On: 04/21/2019 14:20   Ct Abdomen Pelvis W Contrast  Result Date: 04/21/2019 CLINICAL DATA:  Cervical cancer with lung metastasis. Left chest pain and right lower quadrant pain. Metastatic cervical cancer. Nausea vomiting and diarrhea. EXAM: CT CHEST, ABDOMEN, AND PELVIS WITH CONTRAST TECHNIQUE: Multidetector CT imaging of the chest, abdomen and pelvis was performed following the standard protocol during bolus administration of intravenous contrast. CONTRAST:  1058mOMNIPAQUE IOHEXOL 300 MG/ML  SOLN COMPARISON:  Abdominopelvic CT 01/23/2019. Most  recent chest CT 09/30/2018. FINDINGS: CT CHEST FINDINGS Cardiovascular: Aortic atherosclerosis. Normal heart size, without pericardial effusion. Right Port-A-Cath tip at high right atrium. No central pulmonary embolism, on this non-dedicated study. Mediastinum/Nodes: No supraclavicular adenopathy. No mediastinal or hilar adenopathy. Lungs/Pleura: No pleural fluid. Bilateral pulmonary nodules, many of which are cavitary. Comparing the nodules at the lung bases to the 01/23/2019 exam, an index right lung base 12 mm nasion on image 92/4 measured 11 mm on the prior exam (when remeasured). Index left lower lobe pulmonary nodule measures 9 mm on 115/4 versus 8 mm on the prior. A right middle lobe pulmonary nodule measures 8 mm on image 88/4 versus 7 mm on 01/23/2019. Musculoskeletal: No acute osseous abnormality. CT ABDOMEN PELVIS FINDINGS Hepatobiliary: No focal liver lesion. Cholecystectomy with similar biliary duct dilatation, with the common duct measuring 1.6 cm in the porta hepatis just above the pancreatic head today versus similar to on the prior (when remeasured). No obstructive stone or mass. Pancreas: Normal, without mass or ductal dilatation. Spleen: Normal in size, without focal abnormality.  Adrenals/Urinary Tract: Normal adrenal glands. 3 mm inter/lower pole right renal collecting system calculus. Suspect a punctate interpolar left renal collecting system stone. Normal urinary bladder. Stomach/Bowel: Normal stomach, without wall thickening. The rectum and sigmoid demonstrate mild mucosal hyperenhancement, including on image 102/2. Normal terminal ileum. Normal small bowel. Vascular/Lymphatic: Aortic and branch vessel atherosclerosis. Small abdominal retroperitoneal nodes are not pathologic by size criteria and felt to be similar. index gastrohepatic ligament node is unchanged at 12 mm. No abdominopelvic adenopathy. Reproductive: Normal uterus and adnexa. Other: No significant free fluid. No evidence of omental or peritoneal disease. Presacral soft tissue thickening is similar. No significant free fluid. Musculoskeletal: Mild osteopenia. Lumbosacral spondylosis which is age advanced. Convex left lumbar spine curvature. IMPRESSION: CT CHEST IMPRESSION 1. Since the abdominal CT of 01/23/2019, mild progression of pulmonary metastasis. 2. No thoracic adenopathy. 3.  Aortic Atherosclerosis (ICD10-I70.0).  This is age advanced. CT ABDOMEN AND PELVIS IMPRESSION 1.  No  evidence of metastatic disease in the abdomen or pelvis. 2. Cholecystectomy with similar moderate common duct dilatation, without cause identified. 3. Presacral soft tissue thickening is unchanged and likely radiation induced. There is rectosigmoid mucosal hyperenhancement which is likely also radiation induced. Acute infectious or inflammatory colitis cannot be excluded. 4. Right and probable left nephrolithiasis. Electronically Signed   By: Abigail Miyamoto M.D.   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  CT abdomen pelvis-April 2020 -no evidence of  progressive disease in the abdomen pelvis stable bowel  thickening; lung nodules multiple cavitary lesions- increased by few mm. Currently on taxol.  # STOP taxol sec to poor tolerance; plan keytruda every 3 weeks.  #  I discussed the mechanism of action; The goal of therapy is palliative; and length of treatments are likely ongoing/based upon the results of the scans. Discussed the potential side effects of immunotherapy including but not limited to diarrhea; skin rash; elevated LFTs/endocrine abnormalities etc.  After lengthy discussion weighing risk versus benefits patient agrees to proceed with treatment as discussed above.   #Chronic diarrhea- worse/quality of life issue- will discuss with GI re: colostomy.   #Peripheral neuropathy stable. Worse- stop taxol.   #Hypomagnesemia-secondary platinum chemotherapy-mag 1.6; hold IV mag today. Stable.  #Chronic right lower quadrant abdominal pain/scar tissue versus others-on oxycodone stable.  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   # DISPOSITION:  # NO infusions today; de-access port # follow up in 2 weeks-MD-Keytruda /cbc/cmp/mag/TSH; Mag 4 gm/2 hour- Dr.B  # I reviewed the blood work- with the patient in detail; also reviewed the imaging independently [as summarized above]; and with the patient in detail.    # 40 minutes face-to-face with the patient discussing the above plan of care; more than 50% of time spent on prognosis/ natural history; counseling and coordination.  All questions were answered. The patient knows to call the clinic with any problems, questions or concerns.     Cammie Sickle, MD 04/24/2019 7:19 PM

## 2019-04-24 NOTE — Progress Notes (Signed)
DISCONTINUE OFF PATHWAY REGIMEN - [Other Dx]   [id number]:Carboplatin AUC=6 + Paclitaxel 175 mg/m2 q21 Days:   A cycle is every 21 days:     Paclitaxel      Carboplatin   **Always confirm dose/schedule in your pharmacy ordering system**  REASON: Toxicities / Adverse Event PRIOR TREATMENT: Carboplatin AUC=6 + Paclitaxel 175 mg/m2 q21 Days TREATMENT RESPONSE: Stable Disease (SD)  START OFF PATHWAY REGIMEN - Other   [id number]:Pembrolizumab 200 mg q21 Days:   A cycle is 21 days:     Pembrolizumab   **Always confirm dose/schedule in your pharmacy ordering system**  Patient Characteristics: Intent of Therapy: Non-Curative / Palliative Intent, Discussed with Patient

## 2019-04-24 NOTE — Progress Notes (Signed)
Discuss CT scan done on 04/21/2019.  Right side back pain (6/10 pain scale)  radiating down to her hip that gets worse through out the day.  No diarrhea today so far but the last couple of days have been bad.  Wants to discuss possibility of colostomy to "improve quality of life" regarding uncontrolled diarrhea.  GI MD going to refer her to [facility] [address] surgical.

## 2019-04-24 NOTE — Assessment & Plan Note (Addendum)
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  CT abdomen pelvis-April 2020 -no evidence of  progressive disease in the abdomen pelvis stable bowel thickening; lung nodules multiple cavitary lesions- increased by few mm. Currently on taxol.  # STOP taxol sec to poor tolerance; plan keytruda every 3 weeks.  #  I discussed the mechanism of action; The goal of therapy is palliative; and length of treatments are likely ongoing/based upon the results of the scans. Discussed the potential side effects of immunotherapy including but not limited to diarrhea; skin rash; elevated LFTs/endocrine abnormalities etc.  After lengthy discussion weighing risk versus benefits patient agrees to proceed with treatment as discussed above.   #Chronic diarrhea- worse/quality of life issue- will discuss with GI re: colostomy.   #Peripheral neuropathy stable. Worse- stop taxol.   #Hypomagnesemia-secondary platinum chemotherapy-mag 1.6; hold IV mag today. Stable.  #Chronic right lower quadrant abdominal pain/scar tissue versus others-on oxycodone stable.  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   # DISPOSITION:  # NO infusions today; de-access port # follow up in 2 weeks-MD-Keytruda /cbc/cmp/mag/TSH; Mag 4 gm/2 hour- Dr.B  # I reviewed the blood work- with the patient in detail; also reviewed the imaging independently [as summarized above]; and with the patient in detail.    # 40 minutes face-to-face with the patient discussing the above plan of care; more than 50% of time spent on prognosis/ natural history; counseling and coordination.

## 2019-05-05 ENCOUNTER — Encounter: Payer: Self-pay | Admitting: Internal Medicine

## 2019-05-05 ENCOUNTER — Other Ambulatory Visit: Payer: Self-pay | Admitting: *Deleted

## 2019-05-05 ENCOUNTER — Encounter: Payer: Self-pay | Admitting: Oncology

## 2019-05-05 MED ORDER — RIVAROXABAN 20 MG PO TABS: 20.0000 mg | ORAL_TABLET | Freq: Every day | ORAL | 6 refills | Status: DC

## 2019-05-05 NOTE — Telephone Encounter (Signed)
This medicine was refilled this morning

## 2019-05-06 ENCOUNTER — Encounter: Payer: Self-pay | Admitting: Gastroenterology

## 2019-05-06 ENCOUNTER — Other Ambulatory Visit: Payer: Self-pay

## 2019-05-06 ENCOUNTER — Ambulatory Visit: Payer: Medicaid Other | Admitting: Gastroenterology

## 2019-05-06 VITALS — BP 112/79 | HR 83 | Temp 98.5°F | Resp 17 | Ht 67.5 in | Wt 167.6 lb

## 2019-05-06 DIAGNOSIS — K529 Noninfective gastroenteritis and colitis, unspecified: Secondary | ICD-10-CM

## 2019-05-06 MED ORDER — AMITRIPTYLINE HCL 75 MG PO TABS: 75.0000 mg | ORAL_TABLET | Freq: Every day | ORAL | 1 refills | Status: DC

## 2019-05-06 NOTE — Progress Notes (Signed)
[name], MD [address]  [place]  [place], [place] [address]  Main: [phone]  Fax: [phone]    Gastroenterology Consultation  Referring Provider:     Arnetha Courser, MD Primary Care Physician:  Arnetha Courser, MD Primary Gastroenterologist:  Dr. [name] Reason for Consultation:     Chronic diarrhea        HPI:   Joanna Hall is a 53 y.o. female referred by Dr. Hampton Abbot for consultation & management of and chronic unexplained diarrhea. Patient has history of cervical cancer underwent chemotherapy XRT and brachytherapy, appears to be stable, closely followed by oncology at [facility]. She had small bowel obstruction in 08/2017, underwent exploratory laparoscopy with ileocolectomy and primary anastomosis. 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 Currently on Xarelto  Patient reports that since her bowel resection, she has been experiencing several episodes of nonbloody diarrhea, up to 12 times per day and night associated with urgency, nocturnal diarrhea as well as fecal incontinence. She does report diffuse lower abdominal cramps as well as intermittent gas/bloating.patient reports that she may have seen worms in her stools on several occasions. She does drink well water. She denies recent travel outside the [place] or recent use of antibiotics. She did have C. Difficile infection in the past when she was undergoing chemotherapy for cervical cancer. She had stool studies for C. Difficile and other GI pathogens including ova and parasites in 10/2017 by Dr. Hampton Abbot and they were negative.She has tried Imodium and Lomotil with no relief. Therefore she was referred to GI by Dr. Hampton Abbot for further management. Patient denies any particular relation to food. Prior to her surgery, patient reports having alternating episodes of  diarrhea and constipation associated with abdominal cramps and bloating. She would have up to 2-3 soft bowel movements daily but not this severe.her weight has been stable. She denies nausea, vomiting, fever, night sweats. She denies rectal bleeding. She is found to have macrocytic anemia which has been stable. She is not on any B12 or iron supplements.   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 She does not have any evidence of chronic liver disease.  Follow-up visit 12/12/2017 Patient was admitted to the hospital early this month secondary to lower abdominal pain, CT revealed mild rectosigmoid thickening. She reports that her diarrhea resolved after initiation of cholestyramine. She is currently taking 4 g twice daily. Having one bowel movement daily. She denies rectal bleeding. Her anal fissure had also significantly improved since control of her diarrhea. She has not received topical nitroglycerin ointment. However, she continues to have lower abdominal pain, constant with no relation to food or bowel movements. She tried dicyclomine which did not help. She is seeing Dr. Mike Gip for follow-up of lung nodules and is scheduled to have a CT scan in 02/2018. Her disease has not progressed to date. She also has B12 deficiency for which she is taking B12 injections. She stopped taking NSAIDs  Follow-up visit 02/11/2018 Patient was at her weight has been stable. She feels like she is dependent on Bentyl  and cholestyramine for her diarrhea and abdominal pain. Her diarrhea recurs if she does not take cholestyramine. She takes 2 packets daily which in fact leads to constipation. Bentyl helps with her chronic lower  abdominal cramps. Medication for hepatitis C treatment has been approved, and waiting for the medicine to be shipped. She underwent colonoscopy which showed anal condylomata.  Follow up visit 05/31/2018 Patient underwent left [facility] on 04/24/2018. She also had laser ablation of anal condylomata on 02/22/2018 by Dr. Johney Maine. Both procedures went well. 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 Patient was started on chemotherapy on 05/15/2018. She developed abdominal pain with nausea, vomiting and diarrhea for last 2 weeks. She underwent CT abdomen and pelvis which revealed thickening of the colon, underwent stool studies including C. Difficile and other GI pathogen panel which came back negative. Patient is started on ciprofloxacin 3 days ago. I got a call from Ander Purpura, NP from [place] yesterday about her ongoing diarrhea. I recommended to increase cholestyramine to 3 times daily and add Lomotil. Patient is here as a urgent follow-up of her diarrhea  She took third dose of cholestyramine and took 1 dose of Lomotil last night and she had 1 soft bowel movement this morning. She thinks her diarrhea has slowed down. He otherwise denies any other GI symptoms  Follow-up visit 07/09/2018 Restarted chemotherapy for cervical cancer. She reports that her diarrhea recurred after starting chemotherapy. She reports lower abdominal cramps as well. Currently, experiencing very loose, muddy like bowel movements several times a day and sometimes at night. She is on cholestyramine 2 packets daily, Imodium twice daily, Lomotil twice daily. She had repeat stool studies by Dr. Mike Gip on and negative for infection. Her weight has been stable, she denies nausea, appetite is intact. She was also  taking oral magnesium which has been just stopped  Follow-up visit 08/19/2018 Patient made an urgent visit today due to worsening diarrhea since starting antibiotics for dental infection.  She is currently on amoxicillin.  She reports that for the last 1 month her diarrhea has been intermittent, had good days.  She reports the diarrhea really got worse after starting antibiotics.  Her last chemo was about a month ago.  She is waiting for her WBC count to be improved before starting next cycle of chemo.  She is currently on colestipol 1 pill daily, Viberzi 100 mg twice daily, started 2 days ago, tincture of opium 3 times daily, Lomotil once daily, budesonide 3 mg 3 pills daily.  Patient was hospitalized to [facility] few weeks ago, underwent EGD and colonoscopy.  Colonoscopy was unremarkable including biopsies.  EGD revealed moderate villous blunting on duodenal biopsies. However her TTG IgA was negative in the past.  Therefore, I started her empirically on budesonide She is tearful due to ongoing diarrhea, incontinence  Follow-up visit 08/28/2018 Patient made an urgent visit to see me today at the request of her oncologist, Dr. Mike Gip because of low serum cortisol levels.  Her morning cortisol level was 2.7 mcg/dL on 08/20/2018.  Patient is in pleasant mood today as her diarrhea significantly improved.  Currently having upto 2, pudding like stools daily without nocturnal diarrhea.  She thinks adding amitriptyline definitely helped with her diarrhea as well as her mood.  She states that she is not "weepy" like she used to be.  Viberzi was  also a new medication that was added within last 10 days.  Currently, she is taking budesonide 9 mg daily, Viberzi 100 mg twice daily, Bentyl as needed, tincture of opium 3 times daily, colestipol with each meal.  She is not taking Lomotil regularly.  Planning to undergo fourth cycle of chemo in next 1 to 2 weeks.  She completed a course of antibiotics.  Follow-up visit  10/21/2018 Dr. Mike Gip requested to see this patient today due to ongoing lower abdominal pain associated with diarrhea.  Since last visit, patient underwent fifth cycle of chemotherapy in 09/2018.  Patient was recently hospitalized secondary to hematochezia 10/08/2018 to 09/16/18 and underwent colonoscopy which revealed few aphthae in the neo terminal ileum which were biopsied.  There was no presence of Crohn's disease.  Patient received 2 units of PRBCs in the hospital. The colon was normal.  Xarelto was held.  Patient was rehospitalized on 10/17/2018 after a CT of the abdomen pelvis which was concerning for small bowel obstruction.  The CT was originally ordered by her PCP due to vaginal bleeding.  Patient was evaluated by surgery who were not concerned about small bowel obstruction and was discharged home.  Patient reports having intermittent episodes of diarrhea associated with severe lower abdominal cramps and nausea.  She is taking colestipol 3 times a day, Viberzi 100 mg twice daily, budesonide 3 mg 3 times daily, hydrocortisone 5 mg 3 times a day and amitriptyline 50 mg daily.  She has appointment with endocrinology in 02/2019.  She reports having severe lower abdominal cramps when she has a bowel movement which causes nausea and emesis.  She is able to maintain weight.  She reports abdominal bloating as well.  She denies rectal bleeding or black-colored stools.  Her hemoglobin today at cancer center is 13.2.  Patient is tearful with ongoing diarrhea that has caused few episodes of fecal incontinence.  Follow-up visit 11/13/2018 Patient is seeing me at request of Dr. Mike Gip due to recent dark-colored stools and drop in hemoglobin.  Patient was seen by Dr.Cochran last week patient expressed concern that her stools were almost black.  And her hemoglobin dropped from 11.4 to 9.8.  Therefore, she held her Xarelto.  Patient also underwent complete dental extraction and recovering well.  Her hemoglobin today is  9.8.  Patient reports that her diarrhea is mostly intermittent at this time and she mostly has good days in a week.  She has been taking combination of several antidiarrheal medications.  She restarted tincture of opium as well.  She is curious to restart her chemo because she thinks her diarrhea will behave the same no matter she is on chemo or not and this has remained unchanged even when she was receiving chemo in [place].  She is waiting from her insurance to receive approval to see [facility] for second opinion.  She has appointment with endocrinology in May.  Patient gained about 6 pounds and seems to be in good spirits.  She is going to restart Xarelto today.  She reported that she had episodes of foamy, yellow foul-smelling stool on Sunday, but did not have any diarrhea since then.  She has seen Dr. Mike Gip today and stool studies were ordered.  Patient also tried octreotide for chronic diarrhea but did not seem to help  Follow-up visit 04/22/2019 Patient made an urgent visit to see me because of ongoing diarrhea and she wanted to discuss about colostomy due to frequent episodes of bowel incontinence which is impairing  her quality of life significantly.  She is currently on Lomotil, Viberzi and budesonide which modestly controlling her diarrhea.  She was on Lomotil in the past which she has not tried recently.  She has expressed concerns how she cannot go out and dine out or travel because of diarrhea which is unpredictable that has led to several accidents due to lack of bowel control.  She had extensive work-up and also has seen [facility] who did not recommend any further intervention other than empiric treatment with rifaximin mean which she did in the past as well for possible bacterial overgrowth.  Her weight has been stable.  She denies any rectal bleeding.  She is currently undergoing treatment for cervical cancer.  She is currently on Taxol only, had poor tolerance to carboplatin.  She is not  taking hydrocortisone, budesonide, colestipol, rifaximin mean at this time  Follow-up visit 05/06/2019 Patient has been taking opium 3 times a day only.  She continues to have ongoing nonbloody, watery diarrhea.  Her chemotherapy has been stopped and switched to immunotherapy as she was not tolerating it well.  She is interested to see Dr. Hampton Abbot to discuss about colostomy  She denies smoking or drinking alcohol.   NSAIDs: Has stopped taking NSAIDs  Antiplts/Anticoagulants/Anti thrombotics: on Xarelto for factor V bleeding mutation  GI Procedures:  Colonoscopy 10/10/2018 The perianal and digital rectal examinations were normal. There was evidence of a prior end-to-side ileo-colonic anastomosis in the transverse colon. This was patent and was characterized by healthy appearing mucosa. The anastomosis was traversed. 3 aphthous ulcers in the neoterminal ileum, biopsied DIAGNOSIS:  A. TERMINAL ILEUM, ULCER; COLD BIOPSY:  - ENTERIC MUCOSA WITH NORMAL VILLOUS ARCHITECTURE.  - INFLAMED SQUAMOUS MUCOSA.  - FRAGMENTS OF FIBRINOPURULENT ULCER DEBRIS.  - NEGATIVE FOR DYSPLASIA AND MALIGNANCY.  Immunohistochemical studies directed against HSV 1/2 and CMV, with  appropriately reactive controls, are negative. A GMS special stain is  negative for fungal organisms.   EGD and colonoscopy 07/30/2018 DIAGNOSIS:  A. DUODENUM; COLD BIOPSY:  - MODERATE DUODENOPATHY WITH VILLOUS BLUNTING, SEE COMMENT.   Comment:  The duodenal biopsies show shortened and shrunken villi with reactive  changes of surface epithelium. Intraepithelial lymphocytes are not  increased. Peptic duodenitis and medication effect should be considered.  The features are not typical for malabsorption, but clinical correlation  is recommended.   B. NEO-TERMINAL ILEUM; COLD BIOPSY:  - ILEAL MUCOSA WITH INTACT VILLI.  - NEGATIVE FOR ACTIVE INFLAMMATION, INTRAEPITHELIAL LYMPHOCYTOSIS,  INFECTIOUS AGENTS, AND GRANULOMAS.  -  NEGATIVE FOR DYSPLASIA AND MALIGNANCY.   C. COLON; RANDOM COLD BIOPSY:  - NONSPECIFIC CRYPT HYPERPLASIA.  - NEGATIVE FOR ACTIVE INFLAMMATION AND MICROSCOPIC COLITIS.  - NEGATIVE FOR DYSPLASIA AND MALIGNANCY.   EGD 12/30/2017 - Normal duodenal bulb and second portion of the duodenum. - Erythematous mucosa in the stomach. Biopsied. - Normal gastroesophageal junction and esophagus.  Colonoscopy 12/30/2017 - Perianal condylomata found on perianal exam. - Patent end-to-side ileo-colonic anastomosis, characterized by healthy appearing mucosa. - The examined portion of the ileum was normal. Biopsied. - Multiple non-bleeding colonic angioectasias. - Normal mucosa in the entire examined colon. Biopsied. DIAGNOSIS:  A. STOMACH, ANTRUM; COLD BIOPSY:  - MILD CHRONIC GASTRITIS AND REGENERATIVE/REPARATIVE CHANGE.  - NEGATIVE FOR H. PYLORI, DYSPLASIA AND MALIGNANCY.   B. STOMACH, BODY; COLD BIOPSY:  - MINIMAL CHRONIC GASTRITIS AND REGENERATIVE/REPARATIVE CHANGE.  - NEGATIVE FOR H. PYLORI, DYSPLASIA AND MALIGNANCY.   Note: The histologic findings in the antrum and body resemble reactive  gastropathy. Etiologies include drugs/chemical injury (NSAIDs vs.  other), bile reflux, and changes adjacent to an area of healing  ulceration. Clinical correlation with endoscopic findings is required.   C. TERMINAL ILEUM; COLD BIOPSY:  - PARTIALLY FRAGMENTED SMALL BOWEL MUCOSA WITH OVERALL INTACT VILLOUS  ARCHITECTURE.  - NEGATIVE FOR INTRAEPITHELIAL LYMPHOCYTOSIS, DYSPLASIA AND MALIGNANCY.   D. RANDOM COLON; COLD BIOPSY:  - COLONIC MUCOSA NEGATIVE FOR MICROSCOPIC COLITIS, DYSPLASIA AND  MALIGNANCY.   Colonoscopy at [facility] 10/05/2016 Impression:           - One diminutive polyp in the cecum,                        removed with a cold biopsy forceps.                        Resected and retrieved.                       - One 5 mm polyp in the cecum,                        removed with a jumbo cold  forceps.                        Resected and retrieved.                       - One 6 mm polyp in the cecum,                        removed with a hot snare. Resected                        and retrieved.                       - One 5 mm polyp in the descending                        colon, removed with a jumbo cold                        forceps. Resected and retrieved. 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   Intestinal infection due to Clostridium difficile 09/18/2017   Macrocytic anemia with vitamin B12 deficiency    Multiple gastric ulcers    Nausea vomiting and diarrhea    Pancolitis ([place]) 07/27/2018   Perianal condylomata    Pneumonia    History of   Pulmonary nodules     Rectal bleeding    Small bowel obstruction ([place]) 08/2017   Stiff neck    limited right turn   Vitamin D deficiency     Past Surgical History:  Procedure Laterality Date   CHOLECYSTECTOMY     COLON SURGERY  08/2017   resection   COLONOSCOPY WITH PROPOFOL N/A 12/20/2017   Procedure: COLONOSCOPY WITH PROPOFOL;  Surgeon: [name], MD;  Location: [place];  Service: Gastroenterology;  Laterality: N/A;   COLONOSCOPY WITH PROPOFOL N/A 07/30/2018   Procedure: COLONOSCOPY WITH PROPOFOL;  Surgeon: [name], MD;  Location: [facility] ENDOSCOPY;  Service: Gastroenterology;  Laterality: N/A;   COLONOSCOPY WITH PROPOFOL N/A 10/10/2018   Procedure: COLONOSCOPY WITH PROPOFOL;  Surgeon: Lucilla Lame, MD;  Location: [facility] ENDOSCOPY;  Service: Endoscopy;  Laterality: N/A;   DIAGNOSTIC LAPAROSCOPY     ESOPHAGOGASTRODUODENOSCOPY (EGD) WITH PROPOFOL N/A 12/20/2017   Procedure: ESOPHAGOGASTRODUODENOSCOPY (EGD) WITH PROPOFOL;  Surgeon: [name], MD;  Location: [facility] ENDOSCOPY;  Service: Gastroenterology;  Laterality: N/A;   ESOPHAGOGASTRODUODENOSCOPY (EGD) WITH PROPOFOL  07/30/2018   Procedure: ESOPHAGOGASTRODUODENOSCOPY (EGD) WITH PROPOFOL;  Surgeon: [name], MD;  Location: [facility] ENDOSCOPY;  Service: Gastroenterology;;   [place] N/A 11/21/2018   Procedure: FLEXIBLE SIGMOIDOSCOPY;  Surgeon: [name], MD;  Location: [facility] ENDOSCOPY;  Service: Gastroenterology;  Laterality: N/A;   LAPAROTOMY N/A 08/31/2017   Procedure: EXPLORATORY LAPAROTOMY for SBO, ileocolectomy, removal of piece of uterine wall;  Surgeon: [name], MD;  Location: [facility] ORS;  Service: General;  Laterality: N/A;   LASER ABLATION CONDOLAMATA N/A 02/22/2018   Procedure: LASER ABLATION/REMOVAL OF WNUUVOZDGUY AROUND ANUS AND VAGINA;  Surgeon: Michael Boston, MD;  Location: [place];  Service: General;  Laterality: N/A;   OOPHORECTOMY     PORTA CATH  INSERTION N/A 05/13/2018   Procedure: PORTA CATH INSERTION;  Surgeon: Katha Cabal, MD;  Location: [place] CV LAB;  Service: Cardiovascular;  Laterality: N/A;   SMALL INTESTINE SURGERY     TANDEM RING INSERTION     x3   THORACOTOMY     TOTAL KNEE ARTHROPLASTY Left 04/24/2018   Procedure: TOTAL KNEE ARTHROPLASTY;  Surgeon: Lovell Sheehan, MD;  Location: [facility] ORS;  Service: Orthopedics;  Laterality: Left;    Current Outpatient Medications:    amitriptyline (ELAVIL) 75 MG tablet, Take 1 tablet (75 mg total) by mouth at bedtime., Disp: 90 tablet, Rfl: 1   Calcium Carb-Cholecalciferol (CALCIUM 500 +D) 500-400 MG-UNIT TABS, Take 2 tablets by mouth daily., Disp: , Rfl:    diphenoxylate-atropine (LOMOTIL) 2.5-0.025 MG tablet, TAKE (2) TABLETS FOUR TIMES DAILY., Disp: 240 tablet, Rfl: 0   Eluxadoline (VIBERZI) 100 MG TABS, Take 1 tablet (100 mg total) by mouth 2 (two) times daily., Disp: 60 tablet, Rfl: 2   famotidine (PEPCID) 20 MG tablet, Take 1 tablet (20 mg total) by mouth 2 (two) times  daily as needed for heartburn or indigestion., Disp: , Rfl:    hyoscyamine (LEVBID) 0.375 MG 12 hr tablet, Take 1 tablet (0.375 mg total)by mouth 2 (two) times daily., Disp: 60 tablet, Rfl: 0   meclizine (ANTIVERT) 25 MG tablet, Take 1 tablet (25 mg total) by mouth 2 (two) times daily as needed for dizziness., Disp: 15 tablet, Rfl: 0   Multiple Vitamins-Minerals (MULTIVITAMIN WITH MINERALS) tablet, Take 1 tablet by mouth daily., Disp: 30 tablet, Rfl: 0   ondansetron (ZOFRAN ODT) 4 MG disintegrating tablet, Take 1 tablet (4 mg total) by mouth every 8 (eight) hours as needed for nausea or vomiting., Disp: 30 tablet, Rfl: 2   oxyCODONE (OXYCONTIN) 15 mg 12 hr tablet, Take 1 tablet (15 mg total) by mouth every 12 (twelve) hours., Disp: 60 tablet, Rfl: 0   Oxycodone HCl 10 MG TABS, Take 1 tablet (10 mg total) by mouth every 6 (six) hours as needed (for breakthrough pain)., Disp: 90 tablet, Rfl:  0   pantoprazole (PROTONIX) 40 MG tablet, Take 1 tablet (40 mg total) by mouth daily., Disp: 90 tablet, Rfl: 1   potassium chloride SA (K-DUR,KLOR-CON) 20 MEQ tablet, Take 1 tablet (20 mEq total) by mouth daily. (Patient taking differently: Take 20 mEq by mouth 2 (two) times daily. ), Disp: 30 tablet, Rfl: 3   promethazine (PHENERGAN) 25 MG tablet, Take 1 tablet (25 mg total) by mouth every 8 (eight) hours as needed for nausea or vomiting., Disp: 30 tablet, Rfl: 3   rivaroxaban (XARELTO) 20 MG TABS tablet, Take 1 tablet (20 mg total) by mouth daily with supper., Disp: 30 tablet, Rfl: 6   VENTOLIN HFA 108 (90 Base) MCG/ACT inhaler, Inhale 1-2 puffs into the lungs every 4 (four) hours as needed for shortness of breath., Disp: 1 Inhaler, Rfl: 1 No current facility-administered medications for this visit.   Facility-Administered Medications Ordered in Other Visits:    heparin lock flush 100 unit/mL, 500 Units, Intravenous, Once, Corcoran, Melissa C, MD   sodium chloride 0.9 % 1,000 mL with potassium chloride 20 mEq infusion, , Intravenous, Once, Honor Loh E, NP   sodium chloride flush (NS) 0.9 % injection 10 mL, 10 mL, Intravenous, Once, Borders, Kirt Boys, NP   Family History  Problem Relation Age of Onset   Hypertension Father    Diabetes Father    Alcohol abuse Daughter    Hypertension Maternal Grandmother    Diabetes Maternal Grandmother    Diabetes Paternal Grandmother    Hypertension Paternal Grandmother      Social History   Tobacco Use   Smoking status: Former Smoker    Quit date: 10/16/2006    Years since quitting: 12.5   Smokeless tobacco: Never Used  Substance Use Topics   Alcohol use: Not Currently    Frequency: Never    Comment: seldom   Drug use: Yes    Types: Marijuana    Allergies as of 05/06/2019 - Review Complete 05/06/2019  Allergen Reaction Noted   Ketamine Anxiety and Other (See Comments) 02/22/2017    Review of Systems:    All systems  reviewed and negative except where noted in HPI.   Physical Exam:  BP 112/79 (BP Location: Left Arm, Patient Position: Sitting, Cuff Size: Large)    Pulse 83    Temp 98.5 F (36.9 C)    Resp 17    Ht 5' 7.5" (1.715 m)    Wt 167 lb 9.6 oz (76 kg)    BMI 25.86  kg/m  No LMP recorded. Patient is postmenopausal.  General:   Alert,  Well-developed, well-nourished, pleasant and cooperative in NAD Head:  Normocephalic and atraumatic. Eyes:  Sclera clear, no icterus.   Conjunctiva pink. Ears:  Normal auditory acuity. Nose:  No deformity, discharge, or lesions. Mouth:  No deformity or lesions,oropharynx pink & moist. Neck:  Supple; no masses or thyromegaly. Lungs:  Respirations even and unlabored.  Clear throughout to auscultation.   No wheezes, crackles, or rhonchi. No acute distress. Heart:  Regular rate and rhythm; no murmurs, clicks, rubs, or gallops. Abdomen:  Normal bowel sounds. Soft, nontender, and non-distended without masses, hepatosplenomegaly or hernias noted.  No guarding or rebound tenderness.   Rectal: Not performed Msk:  Symmetrical without gross deformities. Good, equal movement & strength bilaterally. Pulses:  Normal pulses noted. Extremities:  No clubbing or edema.  No cyanosis. Neurologic:  Alert and oriented x3;  grossly normal neurologically. Skin:  Intact without significant lesions or rashes. No jaundice. Psych:  Alert and cooperative.  Sad mood and affect.  Imaging Studies: reviewed  Assessment and Plan:   Joanna Hall is a 54 y.o. Caucasian female with history of chronic hepatitis C, treatment nave, stage IV cervical adenocarcinoma, status post chemotherapy XRT and brachytherapy, ongoing treatment at [facility] center, complicated by radiation-induced small bowel obstruction, S/P exploratory laparoscopy with ileocolectomy in 08/2017 seen in follow-up for chronic hepatitis C, chronic diarrhea  Chronic hep C: Genotype 2a/2c, VL 82,400  Treatment nave, plan  is to initiate [place] for 8 weeks  Patient is currently undergoing chemotherapy for cervical cancer There is no evidence of chronic liver disease She had alkaline phosphatase Isoenzymes and liver component is elevated, GGT 209, elevated Hepatitis B surface antigen negative HIV nonreactive hepatitis B surface antibody, core antibody negative Other secondary causes for elevated LFTs are unremarkable  Chronic nonbloody diarrhea: It has been quite challenging to treat her diarrhea despite trying combination of various antidiarrheal medications.  She has history of ileocolectomy. Pancreatic fecal elastase, fecal calprotectin are normal, CRP came back normal. No evidence of microscopic colitis. Worsening of diarrhea after chemotherapy, latest EGD revealed a moderate villous blunting on the duodenal biopsies.  Repeat colonoscopy was unremarkable for colonic pathology.  Latest colonoscopy revealed 3 aphthous ulcers in the neoterminal ileum with no evidence of chronicity.  These ulcers are nonspecific and unclear etiology. Morning serum cortisol levels came back low at 2.7 mcg/dL. 5HIAA normal, celiac serologies TTG and DGP negative.  Serum gastrin mildly elevated which is clinically insignificant. Chromogranin A and VIP levels returned normal.  Cross-sectional imaging has been unremarkable without evidence of space-occupying lesions in the GI tract. Her diarrhea has been transiently responding to the above regimen.  She reports that she feels good for 2 to 3 days then her diarrhea recurs with severe lower abdominal cramps associated with nausea. Due to low serum cortisol levels, I referred her to endocrine to evaluate for adrenal insufficiency. In the meantime, I started her on hydrocortisone 10 mg 2 times a day which she is no longer on it.  I deferred VCE due to concern for retained capsule given her history of small bowel obstruction, ex lap   -Advised her to increase tincture of opium every 4-6  hours -Continue amitriptyline to 75 mg at bedtime -Continue Viberzi 100 mg twice daily -Discontinue Lomotil -Will refer her to Dr. Hampton Abbot to evaluate for colostomy as she was seen by him in the past -Also placed referral to endocrinology again to evaluate  for adrenal insufficiency   Chronic anal fissure and symptomatic hemorrhoids: - Her fissure has significantly improved - I will defer outpatient hemorrhoid ligation for now - she underwent removal of anal condylomata  Follow up as needed  [name], MD

## 2019-05-07 ENCOUNTER — Other Ambulatory Visit: Payer: Self-pay

## 2019-05-07 DIAGNOSIS — K529 Noninfective gastroenteritis and colitis, unspecified: Secondary | ICD-10-CM

## 2019-05-07 DIAGNOSIS — E2740 Unspecified adrenocortical insufficiency: Secondary | ICD-10-CM

## 2019-05-08 ENCOUNTER — Other Ambulatory Visit: Payer: Self-pay | Admitting: Oncology

## 2019-05-08 ENCOUNTER — Inpatient Hospital Stay: Payer: Medicaid Other

## 2019-05-08 ENCOUNTER — Inpatient Hospital Stay (HOSPITAL_BASED_OUTPATIENT_CLINIC_OR_DEPARTMENT_OTHER): Payer: Medicaid Other | Admitting: Internal Medicine

## 2019-05-08 ENCOUNTER — Encounter: Payer: Self-pay | Admitting: Internal Medicine

## 2019-05-08 ENCOUNTER — Other Ambulatory Visit: Payer: Self-pay

## 2019-05-08 DIAGNOSIS — C538 Malignant neoplasm of overlapping sites of cervix uteri: Secondary | ICD-10-CM

## 2019-05-08 DIAGNOSIS — Z9221 Personal history of antineoplastic chemotherapy: Secondary | ICD-10-CM | POA: Diagnosis not present

## 2019-05-08 DIAGNOSIS — C7800 Secondary malignant neoplasm of unspecified lung: Secondary | ICD-10-CM

## 2019-05-08 DIAGNOSIS — Z90722 Acquired absence of ovaries, bilateral: Secondary | ICD-10-CM

## 2019-05-08 DIAGNOSIS — Z923 Personal history of irradiation: Secondary | ICD-10-CM

## 2019-05-08 DIAGNOSIS — C539 Malignant neoplasm of cervix uteri, unspecified: Secondary | ICD-10-CM | POA: Diagnosis not present

## 2019-05-08 DIAGNOSIS — Z79899 Other long term (current) drug therapy: Secondary | ICD-10-CM

## 2019-05-08 DIAGNOSIS — Z7901 Long term (current) use of anticoagulants: Secondary | ICD-10-CM

## 2019-05-08 DIAGNOSIS — G629 Polyneuropathy, unspecified: Secondary | ICD-10-CM

## 2019-05-08 DIAGNOSIS — Z86718 Personal history of other venous thrombosis and embolism: Secondary | ICD-10-CM

## 2019-05-08 DIAGNOSIS — Z5112 Encounter for antineoplastic immunotherapy: Secondary | ICD-10-CM | POA: Diagnosis not present

## 2019-05-08 DIAGNOSIS — Z86711 Personal history of pulmonary embolism: Secondary | ICD-10-CM

## 2019-05-08 DIAGNOSIS — Z7189 Other specified counseling: Secondary | ICD-10-CM

## 2019-05-08 DIAGNOSIS — C530 Malignant neoplasm of endocervix: Secondary | ICD-10-CM

## 2019-05-08 LAB — CBC WITH DIFFERENTIAL/PLATELET
Abs Immature Granulocytes: 0.01 10*3/uL (ref 0.00–0.07)
Basophils Absolute: 0 10*3/uL (ref 0.0–0.1)
Basophils Relative: 0 %
Eosinophils Absolute: 0.1 10*3/uL (ref 0.0–0.5)
Eosinophils Relative: 1 %
HCT: 33 % — ABNORMAL LOW (ref 36.0–46.0)
Hemoglobin: 10.8 g/dL — ABNORMAL LOW (ref 12.0–15.0)
Immature Granulocytes: 0 %
Lymphocytes Relative: 23 %
Lymphs Abs: 0.9 10*3/uL (ref 0.7–4.0)
MCH: 34.2 pg — ABNORMAL HIGH (ref 26.0–34.0)
MCHC: 32.7 g/dL (ref 30.0–36.0)
MCV: 104.4 fL — ABNORMAL HIGH (ref 80.0–100.0)
Monocytes Absolute: 0.3 10*3/uL (ref 0.1–1.0)
Monocytes Relative: 7 %
Neutro Abs: 2.6 10*3/uL (ref 1.7–7.7)
Neutrophils Relative %: 69 %
Platelets: 168 10*3/uL (ref 150–400)
RBC: 3.16 MIL/uL — ABNORMAL LOW (ref 3.87–5.11)
RDW: 14.1 % (ref 11.5–15.5)
WBC: 3.8 10*3/uL — ABNORMAL LOW (ref 4.0–10.5)
nRBC: 0 % (ref 0.0–0.2)

## 2019-05-08 LAB — COMPREHENSIVE METABOLIC PANEL
ALT: 29 U/L (ref 0–44)
AST: 27 U/L (ref 15–41)
Albumin: 3.8 g/dL (ref 3.5–5.0)
Alkaline Phosphatase: 143 U/L — ABNORMAL HIGH (ref 38–126)
Anion gap: 8 (ref 5–15)
BUN: 12 mg/dL (ref 6–20)
CO2: 25 mmol/L (ref 22–32)
Calcium: 9.1 mg/dL (ref 8.9–10.3)
Chloride: 102 mmol/L (ref 98–111)
Creatinine, Ser: 0.81 mg/dL (ref 0.44–1.00)
GFR calc Af Amer: >60 mL/min (ref >60)
GFR calc non Af Amer: >60 mL/min (ref >60)
Glucose, Bld: 99 mg/dL (ref 70–99)
Potassium: 3.7 mmol/L (ref 3.5–5.1)
Sodium: 135 mmol/L (ref 135–145)
Total Bilirubin: 0.6 mg/dL (ref 0.3–1.2)
Total Protein: 7.4 g/dL (ref 6.5–8.1)

## 2019-05-08 LAB — MAGNESIUM: Magnesium: 1.5 mg/dL — ABNORMAL LOW (ref 1.7–2.4)

## 2019-05-08 MED ORDER — SODIUM CHLORIDE 0.9 % IV SOLN
Freq: Once | INTRAVENOUS | Status: AC
  Administered 2019-05-08: 10:00:00 via INTRAVENOUS
  Filled 2019-05-08: qty 250

## 2019-05-08 MED ORDER — MAGNESIUM SULFATE 4 GM/100ML IV SOLN
4.0000 g | Freq: Once | INTRAVENOUS | Status: AC
  Administered 2019-05-08: 10:00:00 4 g via INTRAVENOUS
  Filled 2019-05-08: qty 100

## 2019-05-08 MED ORDER — HYDROXYZINE HCL 10 MG PO TABS: 10.0000 mg | ORAL_TABLET | Freq: Three times a day (TID) | ORAL | 0 refills | Status: DC | PRN

## 2019-05-08 MED ORDER — HEPARIN SOD (PORK) LOCK FLUSH 100 UNIT/ML IV SOLN
500.0000 [IU] | Freq: Once | INTRAVENOUS | Status: AC
  Administered 2019-05-08: 13:00:00 500 [IU] via INTRAVENOUS
  Filled 2019-05-08: qty 5

## 2019-05-08 MED ORDER — SODIUM CHLORIDE 0.9 % IV SOLN
200.0000 mg | Freq: Once | INTRAVENOUS | Status: AC
  Administered 2019-05-08: 200 mg via INTRAVENOUS
  Filled 2019-05-08: qty 8

## 2019-05-08 MED ORDER — SODIUM CHLORIDE 0.9% FLUSH
10.0000 mL | Freq: Once | INTRAVENOUS | Status: AC
  Administered 2019-05-08: 10 mL via INTRAVENOUS
  Filled 2019-05-08: qty 10

## 2019-05-08 MED ORDER — DIPHENHYDRAMINE HCL 25 MG PO CAPS
ORAL_CAPSULE | ORAL | Status: AC
  Filled 2019-05-08: qty 2

## 2019-05-08 MED ORDER — DIPHENHYDRAMINE HCL 25 MG PO CAPS
50.0000 mg | ORAL_CAPSULE | Freq: Once | ORAL | Status: AC
  Administered 2019-05-08: 50 mg via ORAL

## 2019-05-08 NOTE — Progress Notes (Signed)
[facility] NOTE  Patient Care Team: Lada, Satira Anis, MD as PCP - General (Family Medicine) Mellody Drown, MD as Consulting Physician (Obstetrics and Gynecology) Lenor Coffin, MD as Attending Physician (Gynecology) Lequita Asal, MD as Medical Oncologist (Hematology and Oncology) [name], MD as Consulting Physician (Gastroenterology) Michael Boston, MD as Consulting Physician (General Surgery) Lovell Sheehan, MD as Consulting Physician (Orthopedic Surgery) Clent Jacks, RN as Registered Nurse  CHIEF COMPLAINTS/PURPOSE OF CONSULTATION: Cervical cancer  #  Oncology History Overview Note  # dec 2017- CERVICAL ADENO CA [[place]]; Stage IB [Dr. Christene Slates at [facility] in [place], [place];No surgery- Chemo-RT;  # 2018-sep - lung nodules [in [place]]  # AUG 20th, 2019-#1 Carbo-Taxol s/p 6 cycles- [No avastin sec to blood clots]: Feb 2020-bilateral lung nodules with 1 cm in size. March 2020- finished carbo-taxol #7; poor tolerance to [place].  # April 15 th 2020- Start Taxol weekly x 3; one week OFF;  # July 2020-CT- bil cavitary lesions- slightly bigger by few mm/overall STABLE; CT a/p-NED. 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  She received treatment from 11/2016-05/2017.  01/2017 cisplatin x 2 and carboplatin x 1 (01/29/2017) due to ARF and XRT.  XRT was followed by T & O on 02/01/2017 and T & N 02/10/2017 and 02/20/2017.  Course was complicated by 80 pound weight loss, nausea, vomiting,  electrolyte wasting (potassium and magnesium).  She describes that.  Is been sick constantly requiring at least 20 hospitalizations.  Follow-up CT chest and PET on 06/2017. Per patient, 'radiation worked' and no disease in the abdomen.  At that time she was noted to have lung nodules that were growing and follow-up imaging was scheduled for 10/2017.  She was admitted to hospital in [place] for small bowel obstruction which was managed conservatively and she was home for a week prior to traveling to [place] for Thanksgiving holiday where she has family.  She presented to ER in [place] on 08/2017 with nausea, vomiting, and lower abdominal pain.  Symptoms did not respond to conservative treatment.   CT on 08/26/2017 revealed small bowel obstruction with transition in the pelvis just superior to the uterus rather was a long segment of distal ileum with fatty wall thickening compatible with chronic inflammation and/or radiation enteritis. Imaging showed numerous pulmonary nodules consistent with metastatic disease. She underwent laparotomy and right ileocolectomy on 08/31/2017 at [facility].  Surgical findings revealed a thickened, matted, and scarred piece of distal small bowel close to the ileocecal valve.  She was discharged on 09/05/2017.  Pain markedly increased in intensity and imaging was performed on 09/11/2017 which revealed: Debris within the anterior abdominal wall incision concerning for infection versus packing material, s/p post ileo-colectomy with expected postoperative changes, mild colonic ileus, numerous pulmonary nodules highly concerning for metastatic disease, punctate nonobstructing nephrolithiasis.  Staples were removed and one was packed.  She was started on doxycycline.  Abdominal and pelvic CT without contrast on 09/11/2017 revealed debris within anterior abdominal wall incision concerning for infection, versus packing material.   She is s/p ileocolectomy with expected  postoperative changes and mild colonic ileus.  There were numerous pulmonary nodules highly concerning for metastatic disease and punctate nonobstructing nephrolithiasis. She was readmitted on 09/12/2017.  She describes the onset of lower abdominal pain on 09/09/2017.  Pain markedly increased in intensity on 09/11/2017.   Staples were removed and the wound packed.  She was started on doxycycline.  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   PET scan was ordered to evaluate enlarging lung nodules with concern for recurrent cervical cancer but scan was delayed due to insurance and need to be performed in [place].  Presented to ER on 12/03/2017 for abdominal pain and emesis.  Imaging concerning for worsening possible uterine infarct and she was admitted to hospital.  Pelvic MRI was unremarkable.  Remote scarring type changes of uterus thought to be possibly related to radiation.  She was discharged on 12/08/2017.  Underwent endoscopy and colonoscopy on 12/20/2017.    On 02/22/2018 she underwent laser ablation of condylomata around the anus and vagina under anesthesia with Dr. Johney Maine.   04/15/2018:  Chest, abdomen, and pelvis CT revealed innumerable (> 100) cavitary nodules scattered in the lungs, moderately enlarging compared to the 11/08/2017 PET-CT, suspicious for metastatic disease.  One index node in the RLL measures 1.0 x 1.1 cm (previously 0.6 x 0.6 cm).  There were no new nodules.  There was an ill-defined wall thickening in the rectosigmoid with surrounding stranding along fascia planes, probably sequela from prior  radiation therapy.  There was multilevel lumbar impingement due to spondylosis and degenerative disc disease.  There was heterogeneous enhancement in the uterus (some possibly from prior radiation therapy).   04/23/2018:  PET scan revealed numerous scattered solid and cavitary nodules in the lungs stable increased in size compared to the prior PET-CT from 11/08/2017. Largest nodule was 1.1 cm in the LUL (SUV 1.9).  These demonstrated low-grade metabolic activity up to a maximum SUV of about 2.3, increased from 11/08/2017.    Case was discussed at tumor board on 04/25/2018. Consensus to pursue CT-guided biopsy (05/06/18) which revealed: Metastatic adenocarcinoma, morphologically consistent with cervical adenocarcinoma.  She has history of chronic hepatitis C which is managed by GI.  Hepatitis C genotype is 2a/2c.  She receives B12 injections for history of B12 deficiency.  On 04/24/2018 she underwent left total knee replacement with Dr. [name]. --------------------------------------------------------   DIAGNOSIS: Cervical adenocarcinoma  STAGE: IV        ;GOALS: Palliative  CURRENT/MOST RECENT THERAPY : Taxol single agent    Malignant neoplasm of overlapping sites of cervix ([place])  01/29/2019 - 04/23/2019 Chemotherapy   The patient had PACLitaxel (TAXOL) 156 mg in sodium chloride 0.9 % 250 mL chemo infusion (</= 76m/m2), 80 mg/m2 = 156 mg, Intravenous,  Once, 3 of 4 cycles Dose modification: 65 mg/m2 (original dose 80 mg/m2, Cycle 1, Reason: Provider Judgment) Administration: 156 mg (01/29/2019), 156 mg (02/05/2019), 126 mg (02/12/2019), 126 mg (02/26/2019), 126 mg (03/13/2019), 126 mg (03/27/2019), 126 mg (03/20/2019), 126 mg (04/03/2019), 126 mg (04/10/2019)  for chemotherapy treatment.    05/08/2019 -  Chemotherapy   The patient had pembrolizumab (KEYTRUDA) 200 mg in sodium chloride 0.9 %  50 mL chemo infusion, 200 mg, Intravenous, Once, 1 of 6 cycles  for chemotherapy treatment.       HISTORY OF  PRESENTING ILLNESS:  Joanna Hall 53 y.o.  female with a history of adenocarcinoma the cervix with metastasis to the lung most recently on weekly Taxol-with progression noted of the lung metastases is here for follow-up.  Awaiting appt with surgery- for possible colostomy for refractory diarrhea.  I also discussed with Dr. Marius Ditch from GI.  Patient continues to have intermittent nausea with vomiting.  Currently resolved.  Patient notes to have itching all over the body without any obvious rash over the last 1 to 2 days.  Patient tried Benadryl without any significant improvement.  Continues her chronic abdominal pain.  Stable on oxycodone.   Review of Systems  Constitutional: Positive for malaise/fatigue. Negative for chills, diaphoresis, fever and weight loss.  HENT: Negative for nosebleeds and sore throat.   Eyes: Negative for double vision.  Respiratory: Negative for cough, hemoptysis, sputum production, shortness of breath and wheezing.   Cardiovascular: Negative for chest pain, palpitations, orthopnea and leg swelling.  Gastrointestinal: Positive for abdominal pain and diarrhea. Negative for blood in stool, constipation, heartburn, melena, nausea and vomiting.  Genitourinary: Negative for dysuria, frequency and urgency.  Musculoskeletal: Negative for back pain and joint pain.  Skin: Negative.  Negative for itching and rash.  Neurological: Positive for tingling. Negative for dizziness, focal weakness, weakness and headaches.  Endo/Heme/Allergies: Does not bruise/bleed easily.  Psychiatric/Behavioral: Negative for depression. The patient is not nervous/anxious and does not have insomnia.      MEDICAL HISTORY:  Past Medical History:  Diagnosis Date  . Abdominal pain 06/10/2018  . Abnormal cervical Papanicolaou smear 09/18/2017  . Anxiety   . Aortic atherosclerosis ([place])   . Arthritis    neck and knees  . 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 Blood  transfusion without reported diagnosis   . Cervical cancer ([place]) 09/2016   mets lung  . Chronic anal fissure   . Chronic diarrhea   . Dyspnea   . Erosive gastropathy 09/18/2017  . Factor V Leiden mutation ([place])   . Fecal incontinence   . Genital warts   . GERD (gastroesophageal reflux disease)   . Heart murmur   . Hematochezia   . Hemorrhoids   . 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 Hepatitis, chronic ([place]) 05/05/2017  . History of cancer chemotherapy    completed 06/2017  . History of Clostridium difficile infection    while undergoing chemo.  Negative test 10/2017  . Ileocolic anastomotic leak   . 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 Intestinal infection due to Clostridium difficile 09/18/2017  . Macrocytic anemia with vitamin B12 deficiency   . Multiple gastric ulcers   . Nausea vomiting and diarrhea   . Pancolitis ([place]) 07/27/2018  . Perianal condylomata   . Pneumonia    History of  . Pulmonary nodules   . Rectal bleeding   . Small bowel obstruction ([place]) 08/2017  . Stiff neck    limited right turn  . Vitamin D deficiency     SURGICAL HISTORY: Past Surgical History:  Procedure Laterality Date  . CHOLECYSTECTOMY    . COLON SURGERY  08/2017   resection  . COLONOSCOPY WITH PROPOFOL N/A 12/20/2017   Procedure: COLONOSCOPY WITH PROPOFOL;  Surgeon: [name],  MD;  Location: [facility] ENDOSCOPY;  Service: Gastroenterology;  Laterality: N/A;  . COLONOSCOPY WITH PROPOFOL N/A 07/30/2018   Procedure: COLONOSCOPY WITH PROPOFOL;  Surgeon: [name], MD;  Location: [facility] ENDOSCOPY;  Service: Gastroenterology;  Laterality: N/A;  . COLONOSCOPY WITH PROPOFOL N/A 10/10/2018   Procedure: COLONOSCOPY WITH PROPOFOL;  Surgeon: Lucilla Lame, MD;  Location: [facility] ENDOSCOPY;  Service: Endoscopy;  Laterality: N/A;  . DIAGNOSTIC LAPAROSCOPY    . ESOPHAGOGASTRODUODENOSCOPY (EGD) WITH PROPOFOL N/A 12/20/2017   Procedure:  ESOPHAGOGASTRODUODENOSCOPY (EGD) WITH PROPOFOL;  Surgeon: [name], MD;  Location: [facility];  Service: Gastroenterology;  Laterality: N/A;  . ESOPHAGOGASTRODUODENOSCOPY (EGD) WITH PROPOFOL  07/30/2018   Procedure: ESOPHAGOGASTRODUODENOSCOPY (EGD) WITH PROPOFOL;  Surgeon: [name], MD;  Location: [facility] ENDOSCOPY;  Service: Gastroenterology;;  . Otho Darner SIGMOIDOSCOPY N/A 11/21/2018   Procedure: FLEXIBLE SIGMOIDOSCOPY;  Surgeon: [name], MD;  Location[facility] END[facility]COPY;  Service: Gastroenterology;  Laterality: N/A;  . LAPAROTOMY N/A 08/31/2017   Procedure: EXPLORATORY LAPAROTOMY for SBO, ileocolectomy, removal of piece of uterine wall;  Surgeon: [name], MD;  Location: [facility] ORS;  Service: General;  Laterality: N/A;  . LASER ABLATION CONDOLAMATA N/A 02/22/2018   Procedure: LASER ABLATION/REMOVAL OF TOIZTIWPYKD AROUND ANUS AND VAGINA;  Surgeon: Michael Boston, MD;  Location: [facility];  Service: General;  Laterality: N/A;  . OOPHORECTOMY    . PORTA CATH INSERTION N/A 05/13/2018   Procedure: PORTA CATH INSERTION;  Surgeon: Katha Cabal, MD;  Location: [facility] CV LAB;  Service: Cardiovascular;  Laterality: N/A;  . SMALL INTESTINE SURGERY    . TANDEM RING INSERTION     x3  . THORACOTOMY    . TOTAL KNEE ARTHROPLASTY Left 04/24/2018   Procedure: TOTAL KNEE ARTHROPLASTY;  Surgeon: Lovell Sheehan, MD;  Location: [facility] ORS;  Service: Orthopedics;  Laterality: Left;    SOCIAL HISTORY: Social History   Socioeconomic History  . Marital status: Divorced    Spouse name: Not on file  . Number of children: Not on file  . Years of education: Not on file  . Highest education level: Not on file  Occupational History  . Not on file  Social Needs  . Financial resource strain: Not hard at all  . Food insecurity    Worry: Never true    Inability: Never true  . Transportation needs    Medical: No    Non-medical: No  Tobacco Use  . Smoking  status: Former Smoker    Quit date: 10/16/2006    Years since quitting: 12.5  . Smokeless tobacco: Never Used  Substance and Sexual Activity  . Alcohol use: Not Currently    Frequency: Never    Comment: seldom  . Drug use: Yes    Types: Marijuana  . Sexual activity: Not Currently    Birth control/protection: Post-menopausal    Comment: Not Asked  Lifestyle  . Physical activity    Days per week: Patient refused    Minutes per session: Patient refused  . Stress: Only a little  Relationships  . Social Herbalist on phone: Patient refused    Gets together: Patient refused    Attends religious service: Patient refused    Active member of club or organization: Patient refused    Attends meetings of clubs or organizations: Patient refused    Relationship status: Patient refused  . Intimate partner violence    Fear of current or ex partner: No    Emotionally abused: No  Physically abused: No    Forced sexual activity: No  Other Topics Concern  . Not on file  Social History Narrative  . Not on file    FAMILY HISTORY: Family History  Problem Relation Age of Onset  . Hypertension Father   . Diabetes Father   . Alcohol abuse Daughter   . Hypertension Maternal Grandmother   . Diabetes Maternal Grandmother   . Diabetes Paternal Grandmother   . Hypertension Paternal Grandmother     ALLERGIES:  is allergic to ketamine.  MEDICATIONS:  Current Outpatient Medications  Medication Sig Dispense Refill  . amitriptyline (ELAVIL) 75 MG tablet Take 1 tablet (75 mg total) by mouth at bedtime. 90 tablet 1  . Calcium Carb-Cholecalciferol (CALCIUM 500 +D) 500-400 MG-UNIT TABS Take 2 tablets by mouth daily.    . diphenoxylate-atropine (LOMOTIL) 2.5-0.025 MG tablet TAKE (2) TABLETS FOUR TIMES DAILY. 240 tablet 0  . Eluxadoline (VIBERZI) 100 MG TABS Take 1 tablet (100 mg total) by mouth 2 (two) times daily. 60 tablet 2  . famotidine (PEPCID) 20 MG tablet Take 1 tablet (20 mg total)  by mouth 2 (two) times daily as needed for heartburn or indigestion.    . hyoscyamine (LEVBID) 0.375 MG 12 hr tablet Take 1 tablet (0.375 mg total)by mouth 2 (two) times daily. 60 tablet 0  . meclizine (ANTIVERT) 25 MG tablet Take 1 tablet (25 mg total) by mouth 2 (two) times daily as needed for dizziness. 15 tablet 0  . Multiple Vitamins-Minerals (MULTIVITAMIN WITH MINERALS) tablet Take 1 tablet by mouth daily. 30 tablet 0  . ondansetron (ZOFRAN ODT) 4 MG disintegrating tablet Take 1 tablet (4 mg total) by mouth every 8 (eight) hours as needed for nausea or vomiting. 30 tablet 2  . oxyCODONE (OXYCONTIN) 15 mg 12 hr tablet Take 1 tablet (15 mg total) by mouth every 12 (twelve) hours. 60 tablet 0  . Oxycodone HCl 10 MG TABS Take 1 tablet (10 mg total) by mouth every 6 (six) hours as needed (for breakthrough pain). 90 tablet 0  . pantoprazole (PROTONIX) 40 MG tablet Take 1 tablet (40 mg total) by mouth daily. 90 tablet 1  . potassium chloride SA (K-DUR,KLOR-CON) 20 MEQ tablet Take 1 tablet (20 mEq total) by mouth daily. (Patient taking differently: Take 20 mEq by mouth 2 (two) times daily. ) 30 tablet 3  . promethazine (PHENERGAN) 25 MG tablet Take 1 tablet (25 mg total) by mouth every 8 (eight) hours as needed for nausea or vomiting. 30 tablet 3  . rivaroxaban (XARELTO) 20 MG TABS tablet Take 1 tablet (20 mg total) by mouth daily with supper. 30 tablet 6  . VENTOLIN HFA 108 (90 Base) MCG/ACT inhaler Inhale 1-2 puffs into the lungs every 4 (four) hours as needed for shortness of breath. 1 Inhaler 1  . hydrOXYzine (ATARAX/VISTARIL) 10 MG tablet Take 1 tablet (10 mg total) by mouth 3 (three) times daily as needed. 60 tablet 0   No current facility-administered medications for this visit.    Facility-Administered Medications Ordered in Other Visits  Medication Dose Route Frequency Provider Last Rate Last Dose  . heparin lock flush 100 unit/mL  500 Units Intravenous Once Corcoran, Melissa C, MD      .  heparin lock flush 100 unit/mL  500 Units Intravenous Once Charlaine Dalton R, MD      . pembrolizumab [facility]) 200 mg in sodium chloride 0.9 % 50 mL chemo infusion  200 mg Intravenous Once Cammie Sickle, MD      .  sodium chloride 0.9 % 1,000 mL with potassium chloride 20 mEq infusion   Intravenous Once Honor Loh E, NP      . sodium chloride flush (NS) 0.9 % injection 10 mL  10 mL Intravenous Once Borders, Kirt Boys, NP          .  PHYSICAL EXAMINATION: ECOG PERFORMANCE STATUS: 1 - Symptomatic but completely ambulatory  Vitals:   05/08/19 0857  BP: 133/78  Pulse: 88  Resp: 20  Temp: 97.6 F (36.4 C)   Filed Weights   05/08/19 0857  Weight: 167 lb (75.8 kg)    Physical Exam  Constitutional: She is oriented to person, place, and time and well-developed, well-nourished, and in no distress.  Alone.  Walking herself.   HENT:  Head: Normocephalic and atraumatic.  Mouth/Throat: Oropharynx is clear and moist. No oropharyngeal exudate.  Eyes: Pupils are equal, round, and reactive to light.  Neck: Normal range of motion. Neck supple.  Cardiovascular: Normal rate and regular rhythm.  Pulmonary/Chest: Effort normal and breath sounds normal. No respiratory distress. She has no wheezes.  Abdominal: Soft. Bowel sounds are normal. She exhibits no distension and no mass. There is no rebound and no guarding.  Musculoskeletal: Normal range of motion.        General: No tenderness or edema.  Neurological: She is alert and oriented to person, place, and time.  Skin: Skin is warm.  Psychiatric: Affect normal.     LABORATORY DATA:  I have reviewed the data as listed Lab Results  Component Value Date   WBC 3.8 (L) 05/08/2019   HGB 10.8 (L) 05/08/2019   HCT 33.0 (L) 05/08/2019   MCV 104.4 (H) 05/08/2019   PLT 168 05/08/2019   Recent Labs    06/11/18 0510  07/08/18 1035  10/21/18 0845  04/21/19 1335 04/24/19 0823 05/08/19 0833  NA 135   < > 136   < > 134*   < > 135  138 135  K 3.9   < > 4.2   < > 3.9   < > 3.9 4.0 3.7  CL 102   < > 107   < > 98   < > 104 106 102  CO2 27   < > 22   < > 24   < > 22 25 25   GLUCOSE 82   < > 119*   < > 111*   < > 88 90 99  BUN 11   < > 15   < > 17   < > 11 10 12   CREATININE 0.63   < > 0.71   < > 0.91   < > 0.73 0.59 0.81  CALCIUM 8.9   < > 8.7*   < > 9.6   < > 8.7* 8.7* 9.1  GFRNONAA >60   < > >60   < > >60   < > >60 >60 >60  GFRAA >60   < > >60   < > >60   < > >60 >60 >60  PROT 7.0   < > 6.5   < > 8.6*   < > 6.7 6.7 7.4  ALBUMIN 3.5   < > 3.4*   < > 4.4   < > 3.5 3.5 3.8  AST 41   < > 26   < > 48*   < > 30 25 27   ALT 51*   < > 33   < > 67*   < > 29 28 29   ALKPHOS  171*   < > 132*   < > 185*   < > 135* 139* 143*  BILITOT 0.7   < > 0.4   < > 1.4*   < > 0.5 0.3 0.6  BILIDIR 0.1  --  <0.1  --  0.3*  --   --   --   --   IBILI 0.6  --  NOT CALCULATED  --   --   --   --   --   --    < > = values in this interval not displayed.    RADIOGRAPHIC STUDIES: I have personally reviewed the radiological images as listed and agreed with the findings in the report. Ct Chest W Contrast  Result Date: 04/21/2019 CLINICAL DATA:  Cervical cancer with lung metastasis. Left chest pain and right lower quadrant pain. Metastatic cervical cancer. Nausea vomiting and diarrhea. EXAM: CT CHEST, ABDOMEN, AND PELVIS WITH CONTRAST TECHNIQUE: Multidetector CT imaging of the chest, abdomen and pelvis was performed following the standard protocol during bolus administration of intravenous contrast. CONTRAST:  161m OMNIPAQUE IOHEXOL 300 MG/ML  SOLN COMPARISON:  Abdominopelvic CT 01/23/2019. Most recent chest CT 09/30/2018. FINDINGS: CT CHEST FINDINGS Cardiovascular: Aortic atherosclerosis. Normal heart size, without pericardial effusion. Right Port-A-Cath tip at high right atrium. No central pulmonary embolism, on this non-dedicated study. Mediastinum/Nodes: No supraclavicular adenopathy. No mediastinal or hilar adenopathy. Lungs/Pleura: No pleural fluid.  Bilateral pulmonary nodules, many of which are cavitary. Comparing the nodules at the lung bases to the 01/23/2019 exam, an index right lung base 12 mm nasion on image 92/4 measured 11 mm on the prior exam (when remeasured). Index left lower lobe pulmonary nodule measures 9 mm on 115/4 versus 8 mm on the prior. A right middle lobe pulmonary nodule measures 8 mm on image 88/4 versus 7 mm on 01/23/2019. Musculoskeletal: No acute osseous abnormality. CT ABDOMEN PELVIS FINDINGS Hepatobiliary: No focal liver lesion. Cholecystectomy with similar biliary duct dilatation, with the common duct measuring 1.6 cm in the porta hepatis just above the pancreatic head today versus similar to on the prior (when remeasured). No obstructive stone or mass. Pancreas: Normal, without mass or ductal dilatation. Spleen: Normal in size, without focal abnormality. Adrenals/Urinary Tract: Normal adrenal glands. 3 mm inter/lower pole right renal collecting system calculus. Suspect a punctate interpolar left renal collecting system stone. Normal urinary bladder. Stomach/Bowel: Normal stomach, without wall thickening. The rectum and sigmoid demonstrate mild mucosal hyperenhancement, including on image 102/2. Normal terminal ileum. Normal small bowel. Vascular/Lymphatic: Aortic and branch vessel atherosclerosis. Small abdominal retroperitoneal nodes are not pathologic by size criteria and felt to be similar. index gastrohepatic ligament node is unchanged at 12 mm. No abdominopelvic adenopathy. Reproductive: Normal uterus and adnexa. Other: No significant free fluid. No evidence of omental or peritoneal disease. Presacral soft tissue thickening is similar. No significant free fluid. Musculoskeletal: Mild osteopenia. Lumbosacral spondylosis which is age advanced. Convex left lumbar spine curvature. IMPRESSION: CT CHEST IMPRESSION 1. Since the abdominal CT of 01/23/2019, mild progression of pulmonary metastasis. 2. No thoracic adenopathy. 3.  Aortic  Atherosclerosis (ICD10-I70.0).  This is age advanced. CT ABDOMEN AND PELVIS IMPRESSION 1.  No  evidence of metastatic disease in the abdomen or pelvis. 2. Cholecystectomy with similar moderate common duct dilatation, without cause identified. 3. Presacral soft tissue thickening is unchanged and likely radiation induced. There is rectosigmoid mucosal hyperenhancement which is likely also radiation induced. Acute infectious or inflammatory colitis cannot be excluded. 4. Right and probable left nephrolithiasis. Electronically  Signed   By: Abigail Miyamoto M.D.   On: 04/21/2019 14:20   Ct Abdomen Pelvis W Contrast  Result Date: 04/21/2019 CLINICAL DATA:  Cervical cancer with lung metastasis. Left chest pain and right lower quadrant pain. Metastatic cervical cancer. Nausea vomiting and diarrhea. EXAM: CT CHEST, ABDOMEN, AND PELVIS WITH CONTRAST TECHNIQUE: Multidetector CT imaging of the chest, abdomen and pelvis was performed following the standard protocol during bolus administration of intravenous contrast. CONTRAST:  131m OMNIPAQUE IOHEXOL 300 MG/ML  SOLN COMPARISON:  Abdominopelvic CT 01/23/2019. Most recent chest CT 09/30/2018. FINDINGS: CT CHEST FINDINGS Cardiovascular: Aortic atherosclerosis. Normal heart size, without pericardial effusion. Right Port-A-Cath tip at high right atrium. No central pulmonary embolism, on this non-dedicated study. Mediastinum/Nodes: No supraclavicular adenopathy. No mediastinal or hilar adenopathy. Lungs/Pleura: No pleural fluid. Bilateral pulmonary nodules, many of which are cavitary. Comparing the nodules at the lung bases to the 01/23/2019 exam, an index right lung base 12 mm nasion on image 92/4 measured 11 mm on the prior exam (when remeasured). Index left lower lobe pulmonary nodule measures 9 mm on 115/4 versus 8 mm on the prior. A right middle lobe pulmonary nodule measures 8 mm on image 88/4 versus 7 mm on 01/23/2019. Musculoskeletal: No acute osseous abnormality. CT ABDOMEN  PELVIS FINDINGS Hepatobiliary: No focal liver lesion. Cholecystectomy with similar biliary duct dilatation, with the common duct measuring 1.6 cm in the porta hepatis just above the pancreatic head today versus similar to on the prior (when remeasured). No obstructive stone or mass. Pancreas: Normal, without mass or ductal dilatation. Spleen: Normal in size, without focal abnormality. Adrenals/Urinary Tract: Normal adrenal glands. 3 mm inter/lower pole right renal collecting system calculus. Suspect a punctate interpolar left renal collecting system stone. Normal urinary bladder. Stomach/Bowel: Normal stomach, without wall thickening. The rectum and sigmoid demonstrate mild mucosal hyperenhancement, including on image 102/2. Normal terminal ileum. Normal small bowel. Vascular/Lymphatic: Aortic and branch vessel atherosclerosis. Small abdominal retroperitoneal nodes are not pathologic by size criteria and felt to be similar. index gastrohepatic ligament node is unchanged at 12 mm. No abdominopelvic adenopathy. Reproductive: Normal uterus and adnexa. Other: No significant free fluid. No evidence of omental or peritoneal disease. Presacral soft tissue thickening is similar. No significant free fluid. Musculoskeletal: Mild osteopenia. Lumbosacral spondylosis which is age advanced. Convex left lumbar spine curvature. IMPRESSION: CT CHEST IMPRESSION 1. Since the abdominal CT of 01/23/2019, mild progression of pulmonary metastasis. 2. No thoracic adenopathy. 3.  Aortic Atherosclerosis (ICD10-I70.0).  This is age advanced. CT ABDOMEN AND PELVIS IMPRESSION 1.  No  evidence of metastatic disease in the abdomen or pelvis. 2. Cholecystectomy with similar moderate common duct dilatation, without cause identified. 3. Presacral soft tissue thickening is unchanged and likely radiation induced. There is rectosigmoid mucosal hyperenhancement which is likely also radiation induced. Acute infectious or inflammatory colitis cannot be  excluded. 4. Right and probable left nephrolithiasis. Electronically Signed   By: KAbigail MiyamotoM.D.   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  CT abdomen pelvis-April 2020 -no evidence of  progressive disease in the abdomen pelvis stable bowel thickening; lung nodules multiple cavitary lesions- increased by few mm.STOP taxol sec to poor tolerance; plan keytruda every 3 weeks starting today.  # proceed with Keytruda cycle #1 Labs today reviewed;  acceptable for treatment today.   # I discussed the mechanism of action; The goal of  therapy is palliative; and again reviewed the side effects of therapy in detail.  #Chronic diarrhea- worse/quality of life issue-"short bowel"discussed with GI re: colostomy; awaiting evaluation with surgery in 1st week of aug.   # skin itch/ no rash- ? Etiology- recommend atarax prn.   #Peripheral neuropathy- Stable.   #Hypomagnesemia-secondary platinum chemotherapy-mag 1.6; hold IV mag today. Stable.   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   # adrenal insuffiency- on maintenance steroids- stable.  # DISPOSITION:  # Keytruda/Mag today. # 1 weeks- mag level; IVFs over 1 hour /Mag2h infusion # 2 weeks-  mag level; IVFs over 1 hour /Mag2h infusion # follow up in 3 weeks-MD-Keytruda /cbc/cmp/mag/TSH; Mag 4 gm/2 hour- Dr.B    All questions were answered. The patient knows to call the clinic with any problems, questions or concerns.     Cammie Sickle, MD 05/08/2019 12:20 PM

## 2019-05-08 NOTE — Assessment & Plan Note (Signed)
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  CT abdomen pelvis-April 2020 -no evidence of  progressive disease in the abdomen pelvis stable bowel thickening; lung nodules multiple cavitary lesions- increased by few mm.STOP taxol sec to poor tolerance; plan keytruda every 3 weeks starting today.  # proceed with Keytruda cycle #1 Labs today reviewed;  acceptable for treatment today.   # I discussed the mechanism of action; The goal of therapy is palliative; and again reviewed the side effects of therapy in detail.  #Chronic diarrhea- worse/quality of life issue-"short bowel"discussed with GI re: colostomy; awaiting evaluation with surgery in 1st week of aug.   # skin itch/ no rash- ? Etiology- recommend atarax prn.   #Peripheral neuropathy- Stable.   #Hypomagnesemia-secondary platinum chemotherapy-mag 1.6; hold IV mag today. Stable.   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   # adrenal insuffiency- on maintenance steroids- stable.  # DISPOSITION:  # Keytruda/Mag today. # 1 weeks- mag level; IVFs over 1 hour /Mag2h infusion # 2 weeks-  mag level; IVFs over 1 hour /Mag2h infusion # follow up in 3 weeks-MD-Keytruda /cbc/cmp/mag/TSH; Mag 4 gm/2 hour- Dr.B

## 2019-05-09 ENCOUNTER — Other Ambulatory Visit: Payer: Self-pay | Admitting: *Deleted

## 2019-05-09 ENCOUNTER — Encounter: Payer: Self-pay | Admitting: Internal Medicine

## 2019-05-09 ENCOUNTER — Encounter: Payer: Self-pay | Admitting: Oncology

## 2019-05-09 ENCOUNTER — Inpatient Hospital Stay (HOSPITAL_BASED_OUTPATIENT_CLINIC_OR_DEPARTMENT_OTHER): Payer: Medicaid Other | Admitting: Nurse Practitioner

## 2019-05-09 DIAGNOSIS — C538 Malignant neoplasm of overlapping sites of cervix uteri: Secondary | ICD-10-CM

## 2019-05-09 DIAGNOSIS — Z5189 Encounter for other specified aftercare: Secondary | ICD-10-CM

## 2019-05-09 MED ORDER — OXYCODONE HCL 10 MG PO TABS: 10.0000 mg | ORAL_TABLET | Freq: Four times a day (QID) | ORAL | 0 refills | Status: DC | PRN

## 2019-05-09 NOTE — Telephone Encounter (Signed)
Patient called [facility] requesting refill of oxycodone.   As mandated by the [place] STOP Act (Strengthen Opioid Misuse Prevention), the [place] Controlled Substance Reporting System ([place]) was reviewed for this patient.  Below is the past 47-month of controlled substance prescriptions as displayed by the registry.  I have personally consulted with my supervising physician, Dr. BRogue Bussing who agrees that continuation of opiate therapy is medically appropriate at this time and agrees to provide continual monitoring, including urine/blood drug screens, as indicated. Prescription sent electronically using [employer] secure transmission to requested pharmacy.   [place]. Will update patient fill quantity to reflect 30 day supply.   LBeckey Rutter DNP, AGNP-C COutlookat [facility]En[phone](work cell) [phone](office)

## 2019-05-09 NOTE — Progress Notes (Signed)
Virtual Visit Progress Note  Symptom Management [place]  [phone] [phone]  I connected with Joanna Hall on 05/09/19 at  3:00 PM EDT by telephone visit and verified that I am speaking with the correct person using two identifiers.   I discussed the limitations, risks, security and privacy concerns of performing an evaluation and management service by telemedicine and the availability of in-person appointments. I also discussed with the patient that there may be a patient responsible charge related to this service. The patient expressed understanding and agreed to proceed.   Other persons participating in the visit and their role in the encounter: none  Patients location: home Providers location: clinic  Chief Complaint: malaise, diarrhea    Patient Care Team: Lada, Satira Anis, MD as PCP - General (Family Medicine) Mellody Drown, MD as Consulting Physician (Obstetrics and Gynecology) Lenor Coffin, MD as Attending Physician (Gynecology) Lequita Asal, MD as Medical Oncologist (Hematology and Oncology) [name], MD as Consulting Physician (Gastroenterology) Michael Boston, MD as Consulting Physician (General Surgery) Lovell Sheehan, MD as Consulting Physician (Orthopedic Surgery) Clent Jacks, RN as Registered Nurse   Name of the patient: Joanna Hall  [id number]  [birth year]   Date of visit: 05/09/19  Diagnosis- Cervical Cancer  Chief complaint/ Reason for visit- malaise & Diarrhea  Heme/Onc history:  Oncology History Overview Note  # dec 2017- CERVICAL ADENO CA [[place]]; Stage IB [Dr. Christene Slates at [facility] in [place], [place];No surgery- Chemo-RT;  # 2018-sep - lung nodules [in [place]]  # AUG 20th, 2019-#1 Carbo-Taxol s/p 6 cycles- [No avastin sec to blood clots]: Feb 2020-bilateral lung nodules with 1 cm in size. March 2020- finished  carbo-taxol #7; poor tolerance to [place].  # April 15 th 2020- Start Taxol weekly x 3; one week OFF;  # July 2020-CT- bil cavitary lesions- slightly bigger by few mm/overall STABLE; CT a/p-NED. 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  She received treatment from 11/2016-05/2017.  01/2017 cisplatin x 2 and carboplatin x 1 (01/29/2017) due to ARF and XRT.  XRT was followed by T & O on 02/01/2017 and T & N 02/10/2017 and 02/20/2017.  Course was complicated by 80 pound weight loss, nausea, vomiting, electrolyte wasting (potassium and magnesium).  She describes that.  Is been sick constantly requiring at least 20 hospitalizations.  Follow-up CT chest and PET on 06/2017. Per patient, 'radiation worked' and no disease in the abdomen.  At that time she was noted to have lung nodules that were growing and follow-up imaging was scheduled for 10/2017.  She was admitted to hospital in [place] for small bowel obstruction which was managed conservatively and she was home for a week prior to traveling to [place] for Thanksgiving holiday where she has family.  She presented to ER in [place] on 08/2017 with nausea, vomiting, and lower abdominal pain.  Symptoms did not respond to conservative treatment.   CT on 08/26/2017 revealed small bowel obstruction with transition in the pelvis just superior to the uterus rather was a long segment of distal  ileum with fatty wall thickening compatible with chronic inflammation and/or radiation enteritis. Imaging showed numerous pulmonary nodules consistent  with metastatic disease. She underwent laparotomy and right ileocolectomy on 08/31/2017 at [facility].  Surgical findings revealed a thickened, matted, and scarred piece of distal small bowel close to the ileocecal valve.  She was discharged on 09/05/2017.  Pain markedly increased in intensity and imaging was performed on 09/11/2017 which revealed: Debris within the anterior abdominal wall incision concerning for infection versus packing material, s/p post ileo-colectomy with expected postoperative changes, mild colonic ileus, numerous pulmonary nodules highly concerning for metastatic disease, punctate nonobstructing nephrolithiasis.  Staples were removed and one was packed.  She was started on doxycycline.  Abdominal and pelvic CT without contrast on 09/11/2017 revealed debris within anterior abdominal wall incision concerning for infection, versus packing material.   She is s/p ileocolectomy with expected postoperative changes and mild colonic ileus.  There were numerous pulmonary nodules highly concerning for metastatic disease and punctate nonobstructing nephrolithiasis. She was readmitted on 09/12/2017.  She describes the onset of lower abdominal pain on 09/09/2017.  Pain markedly increased in intensity on 09/11/2017.   Staples were removed and the wound packed.  She was started on doxycycline.  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   PET scan was ordered to evaluate enlarging lung nodules with concern for recurrent cervical cancer but scan was delayed due to insurance and need to be performed in [place].  Presented  to ER on 12/03/2017 for abdominal pain and emesis.  Imaging concerning for worsening possible uterine infarct and she was admitted to hospital.  Pelvic MRI was unremarkable.  Remote scarring type changes of uterus thought to be possibly related to radiation.  She was discharged on 12/08/2017.  Underwent endoscopy and colonoscopy on 12/20/2017.    On 02/22/2018 she underwent laser ablation of condylomata around the anus and vagina under anesthesia with Dr. Johney Maine.   04/15/2018:  Chest, abdomen, and pelvis CT revealed innumerable (> 100) cavitary nodules scattered in the lungs, moderately enlarging compared to the 11/08/2017 PET-CT, suspicious for metastatic disease.  One index node in the RLL measures 1.0 x 1.1 cm (previously 0.6 x 0.6 cm).  There were no new nodules.  There was an ill-defined wall thickening in the rectosigmoid with surrounding stranding along fascia planes, probably sequela from prior radiation therapy.  There was multilevel lumbar impingement due to spondylosis and degenerative disc disease.  There was heterogeneous enhancement in the uterus (some possibly from prior radiation therapy).   04/23/2018:  PET scan revealed numerous scattered solid and cavitary nodules in the lungs stable increased in size compared to the prior PET-CT from 11/08/2017. Largest nodule was 1.1 cm in the LUL (SUV 1.9).  These demonstrated low-grade metabolic activity up to a maximum SUV of about 2.3, increased from 11/08/2017.    Case was discussed at tumor board on 04/25/2018. Consensus to pursue CT-guided biopsy (05/06/18) which revealed: Metastatic adenocarcinoma, morphologically consistent with cervical adenocarcinoma.  She has history of chronic hepatitis C which is managed by GI.  Hepatitis C genotype is 2a/2c.  She receives B12 injections for history of B12 deficiency.  On 04/24/2018  she underwent left total knee replacement with Dr. [name]. --------------------------------------------------------   DIAGNOSIS:  Cervical adenocarcinoma  STAGE: IV        ;GOALS: Palliative  CURRENT/MOST RECENT THERAPY : Keytruda    Malignant neoplasm of overlapping sites of cervix ([place])  01/29/2019 - 04/23/2019 Chemotherapy   The patient had PACLitaxel (TAXOL) 156 mg in sodium chloride 0.9 % 250 mL chemo infusion (</= 9m/m2), 80 mg/m2 = 156 mg, Intravenous,  Once, 3 of 4 cycles Dose modification: 65 mg/m2 (original dose 80 mg/m2, Cycle 1, Reason: Provider Judgment) Administration: 156 mg (01/29/2019), 156 mg (02/05/2019), 126 mg (02/12/2019), 126 mg (02/26/2019), 126 mg (03/13/2019), 126 mg (03/27/2019), 126 mg (03/20/2019), 126 mg (04/03/2019), 126 mg (04/10/2019)  for chemotherapy treatment.    05/08/2019 -  Chemotherapy   The patient had pembrolizumab (KEYTRUDA) 200 mg in sodium chloride 0.9 % 50 mL chemo infusion, 200 mg, Intravenous, Once, 1 of 6 cycles Administration: 200 mg (05/08/2019)  for chemotherapy treatment.      Interval history-Orlandria Royse, 53year old female with above history of cervical cancer, presents to symptom management clinic for malaise and diarrhea since starting Keytruda on 7/23.  Associated symptoms: Decreased appetite, diarrhea, nausea, fatigue, and diarrhea.  She says symptoms started today and have persisted since that time.  She has chronic diarrhea but feels it is somewhat worse.  She has tried Zofran, Phenergan, Lomotil for her symptoms. Unsure if anything makes symptoms better or worse.   She said she was also complaining of itching when she saw Dr. BRogue Bussingon 7/23 and was started on Atarax which improved her symptoms.   She denies neurologic complaints.  Denies fever or illness.  Appetite is stable and she denies weight loss.  Denies chest pain.  Denies urinary complaints.  ECOG FS:1 - Symptomatic but completely ambulatory  Review of systems- Review of Systems  Constitutional: Positive for malaise/fatigue. Negative for chills, fever and weight loss.  HENT: Negative for  congestion.   Eyes: Negative for redness.  Respiratory: Negative for cough, shortness of breath and wheezing.   Cardiovascular: Negative for chest pain and palpitations.  Gastrointestinal: Positive for diarrhea and nausea. Negative for abdominal pain, blood in stool, constipation, melena and vomiting.  Genitourinary: Negative for dysuria and hematuria.  Musculoskeletal: Positive for myalgias. Negative for falls.  Skin: Negative.  Negative for itching and rash.  Neurological: Negative for dizziness, loss of consciousness and weakness.  Psychiatric/Behavioral: The patient is nervous/anxious. The patient does not have insomnia.      Current treatment-Keytruda with palliative intent  Allergies  Allergen Reactions   Ketamine Anxiety and Other (See Comments)    Syncope episode/confusion     Past Medical History:  Diagnosis Date   Abdominal pain 06/10/2018   Abnormal cervical Papanicolaou smear 09/18/2017   Anxiety    Aortic atherosclerosis (HCC)    Arthritis    neck and knees   Blood clots in brain    both lungs and right kidney   Blood transfusion without reported diagnosis    [place]   mets lung   Chronic anal fissure    Chronic diarrhea    Dyspnea    Erosive gastropathy 09/18/2017   Factor V Leiden mutation ([place]    Fecal incontinence    Genital warts    GERD (gastroesophageal reflux disease)    Heart murmur    Hematochezia    Hemorrhoids    Hepatitis C    Chronic, 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   Intestinal infection due to Clostridium difficile 09/18/2017   Macrocytic anemia with vitamin B12 deficiency    Multiple gastric ulcers    Nausea  vomiting and diarrhea    Pancolitis ([place]) 07/27/2018   Perianal condylomata    Pneumonia    History of   Pulmonary nodules    Rectal bleeding    Small bowel obstruction ([place]) 08/2017   Stiff neck    limited right turn   Vitamin D deficiency     Past Surgical History:  Procedure Laterality Date   CHOLECYSTECTOMY     COLON SURGERY  08/2017   resection   COLONOSCOPY WITH PROPOFOL N/A 12/20/2017   Procedure: COLONOSCOPY WITH PROPOFOL;  Surgeon: [name], MD;  Location: [place];  Service: Gastroenterology;  Laterality: N/A;   COLONOSCOPY WITH PROPOFOL N/A 07/30/2018   Procedure: COLONOSCOPY WITH PROPOFOL;  Surgeon: [name], MD;  Location: [facility] ENDOSCOPY;  Service: Gastroenterology;  Laterality: N/A;   COLONOSCOPY WITH PROPOFOL N/A 10/10/2018   Procedure: COLONOSCOPY WITH PROPOFOL;  Surgeon: Lucilla Lame, MD;  Location: [facility] ENDOSCOPY;  Service: Endoscopy;  Laterality: N/A;   DIAGNOSTIC LAPAROSCOPY     ESOPHAGOGASTRODUODENOSCOPY (EGD) WITH PROPOFOL N/A 12/20/2017   Procedure: ESOPHAGOGASTRODUODENOSCOPY (EGD) WITH PROPOFOL;  Surgeon: [name], MD;  Location: [facility] ENDOSCOPY;  Service: Gastroenterology;  Laterality: N/A;   ESOPHAGOGASTRODUODENOSCOPY (EGD) WITH PROPOFOL  07/30/2018   Procedure: ESOPHAGOGASTRODUODENOSCOPY (EGD) WITH PROPOFOL;  Surgeon: [name], MD;  Location: [facility] ENDOSCOPY;  Service: Gastroenterology;;   [place] N/A 11/21/2018   Procedure: FLEXIBLE SIGMOIDOSCOPY;  Surgeon: [name], MD;  Location[facility] ENDOSCOPY;  Service: Gastroenterology;  Laterality: N/A;   LAPAROTOMY N/A 08/31/2017   Procedure: EXPLORATORY LAPAROTOMY for SBO, ileocolectomy, removal of piece of uterine wall;  Surgeon: [name], MD;  Location: [facility] ORS;  Service: General;  Laterality: N/A;   LASER ABLATION CONDOLAMATA N/A 02/22/2018   Procedure: LASER ABLATION/REMOVAL OF [id number] AROUND ANUS AND VAGINA;   Surgeon: Michael Boston, MD;  Location: [place];  Service: General;  Laterality: N/A;   OOPHORECTOMY     PORTA CATH INSERTION N/A 05/13/2018   Procedure: PORTA CATH INSERTION;  Surgeon: Katha Cabal, MD;  Location: [place] CV LAB;  Service: Cardiovascular;  Laterality: N/A;   SMALL INTESTINE SURGERY     TANDEM RING INSERTION     x3   THORACOTOMY     TOTAL KNEE ARTHROPLASTY Left 04/24/2018   Procedure: TOTAL KNEE ARTHROPLASTY;  Surgeon: Lovell Sheehan, MD;  Location: [facility] ORS;  Service: Orthopedics;  Laterality: Left;    Social History   Socioeconomic History   Marital status: Divorced    Spouse name: Not on file   Number of children: Not on file   Years of education: Not on file   Highest education level: Not on file  Occupational History   Not on file  Social Needs   Financial resource strain: Not hard at all   Food insecurity    Worry: Never true    Inability: Never true   Transportation needs    Medical: No    Non-medical: No  Tobacco Use   Smoking status: Former Smoker  Quit date: 10/16/2006    Years since quitting: 12.5   Smokeless tobacco: Never Used  Substance and Sexual Activity   Alcohol use: Not Currently    Frequency: Never    Comment: seldom   Drug use: Yes    Types: Marijuana   Sexual activity: Not Currently    Birth control/protection: Post-menopausal    Comment: Not Asked  Lifestyle   Physical activity    Days per week: Patient refused    Minutes per session: Patient refused   Stress: Only a little  Relationships   Press photographer on phone: Patient refused    Gets together: Patient refused    Attends religious service: Patient refused    Active member of club or organization: Patient refused    Attends meetings of clubs or organizations: Patient refused    Relationship status: Patient refused   Intimate partner violence    Fear of current or ex partner: No    Emotionally abused:  No    Physically abused: No    Forced sexual activity: No  Other Topics Concern   Not on file  Social History Narrative   Not on file    Family History  Problem Relation Age of Onset   Hypertension Father    Diabetes Father    Alcohol abuse Daughter    Hypertension Maternal Grandmother    Diabetes Maternal Grandmother    Diabetes Paternal Grandmother    Hypertension Paternal Grandmother      Current Outpatient Medications:    amitriptyline (ELAVIL) 75 MG tablet, Take 1 tablet (75 mg total) by mouth at bedtime., Disp: 90 tablet, Rfl: 1   Calcium Carb-Cholecalciferol (CALCIUM 500 +D) 500-400 MG-UNIT TABS, Take 2 tablets by mouth daily., Disp: , Rfl:    diphenoxylate-atropine (LOMOTIL) 2.5-0.025 MG tablet, TAKE (2) TABLETS FOUR TIMES DAILY., Disp: 240 tablet, Rfl: 0   Eluxadoline (VIBERZI) 100 MG TABS, Take 1 tablet (100 mg total) by mouth 2 (two) times daily., Disp: 60 tablet, Rfl: 2   famotidine (PEPCID) 20 MG tablet, Take 1 tablet (20 mg total) by mouth 2 (two) times daily as needed for heartburn or indigestion., Disp: , Rfl:    hydrOXYzine (ATARAX/VISTARIL) 10 MG tablet, Take 1 tablet (10 mg total) by mouth 3 (three) times daily as needed., Disp: 60 tablet, Rfl: 0   hyoscyamine (LEVBID) 0.375 MG 12 hr tablet, Take 1 tablet (0.375 mg total)by mouth 2 (two) times daily., Disp: 60 tablet, Rfl: 0   meclizine (ANTIVERT) 25 MG tablet, Take 1 tablet (25 mg total) by mouth 2 (two) times daily as needed for dizziness., Disp: 15 tablet, Rfl: 0   Multiple Vitamins-Minerals (MULTIVITAMIN WITH MINERALS) tablet, Take 1 tablet by mouth daily., Disp: 30 tablet, Rfl: 0   ondansetron (ZOFRAN ODT) 4 MG disintegrating tablet, Take 1 tablet (4 mg total) by mouth every 8 (eight) hours as needed for nausea or vomiting., Disp: 30 tablet, Rfl: 2   oxyCODONE (OXYCONTIN) 15 mg 12 hr tablet, Take 1 tablet (15 mg total) by mouth every 12 (twelve) hours., Disp: 60 tablet, Rfl: 0   Oxycodone  HCl 10 MG TABS, Take 1 tablet (10 mg total) by mouth every 6 (six) hours as needed (for breakthrough pain)., Disp: 120 tablet, Rfl: 0   pantoprazole (PROTONIX) 40 MG tablet, Take 1 tablet (40 mg total) by mouth daily., Disp: 90 tablet, Rfl: 1   potassium chloride SA (K-DUR,KLOR-CON) 20 MEQ tablet, Take 1 tablet (20 mEq total) by mouth  daily. (Patient taking differently: Take 20 mEq by mouth 2 (two) times daily. ), Disp: 30 tablet, Rfl: 3   promethazine (PHENERGAN) 25 MG tablet, Take 1 tablet (25 mg total) by mouth every 8 (eight) hours as needed for nausea or vomiting., Disp: 30 tablet, Rfl: 3   rivaroxaban (XARELTO) 20 MG TABS tablet, Take 1 tablet (20 mg total) by mouth daily with supper., Disp: 30 tablet, Rfl: 6   VENTOLIN HFA 108 (90 Base) MCG/ACT inhaler, Inhale 1-2 puffs into the lungs every 4 (four) hours as needed for shortness of breath., Disp: 1 Inhaler, Rfl: 1 No current facility-administered medications for this visit.   Facility-Administered Medications Ordered in Other Visits:    heparin lock flush 100 unit/mL, 500 Units, Intravenous, Once, Corcoran, Melissa C, MD   sodium chloride 0.9 % 1,000 mL with potassium chloride 20 mEq infusion, , Intravenous, Once, Honor Loh E, NP   sodium chloride flush (NS) 0.9 % injection 10 mL, 10 mL, Intravenous, Once, Borders, Kirt Boys, NP  Physical exam: Exam limited due to telemedicine   CMP Latest Ref Rng & Units 05/08/2019  Glucose 70 - 99 mg/dL 99  BUN 6 - 20 mg/dL 12  Creatinine 0.44 - 1.00 mg/dL 0.81  Sodium 135 - 145 mmol/L 135  Potassium 3.5 - 5.1 mmol/L 3.7  Chloride 98 - 111 mmol/L 102  CO2 22 - 32 mmol/L 25  Calcium 8.9 - 10.3 mg/dL 9.1  Total Protein 6.5 - 8.1 g/dL 7.4  Total Bilirubin 0.3 - 1.2 mg/dL 0.6  Alkaline Phos 38 - 126 U/L 143(H)  AST 15 - 41 U/L 27  ALT 0 - 44 U/L 29   CBC Latest Ref Rng & Units 05/08/2019  WBC 4.0 - 10.5 K/uL 3.8(L)  Hemoglobin 12.0 - 15.0 g/dL 10.8(L)  Hematocrit 36.0 - 46.0 % 33.0(L)    Platelets 150 - 400 K/uL 168    No images are attached to the encounter.  Ct Chest W Contrast  Result Date: 04/21/2019 CLINICAL DATA:  Cervical cancer with lung metastasis. Left chest pain and right lower quadrant pain. Metastatic cervical cancer. Nausea vomiting and diarrhea. EXAM: CT CHEST, ABDOMEN, AND PELVIS WITH CONTRAST TECHNIQUE: Multidetector CT imaging of the chest, abdomen and pelvis was performed following the standard protocol during bolus administration of intravenous contrast. CONTRAST:  191m OMNIPAQUE IOHEXOL 300 MG/ML  SOLN COMPARISON:  Abdominopelvic CT 01/23/2019. Most recent chest CT 09/30/2018. FINDINGS: CT CHEST FINDINGS Cardiovascular: Aortic atherosclerosis. Normal heart size, without pericardial effusion. Right Port-A-Cath tip at high right atrium. No central pulmonary embolism, on this non-dedicated study. Mediastinum/Nodes: No supraclavicular adenopathy. No mediastinal or hilar adenopathy. Lungs/Pleura: No pleural fluid. Bilateral pulmonary nodules, many of which are cavitary. Comparing the nodules at the lung bases to the 01/23/2019 exam, an index right lung base 12 mm nasion on image 92/4 measured 11 mm on the prior exam (when remeasured). Index left lower lobe pulmonary nodule measures 9 mm on 115/4 versus 8 mm on the prior. A right middle lobe pulmonary nodule measures 8 mm on image 88/4 versus 7 mm on 01/23/2019. Musculoskeletal: No acute osseous abnormality. CT ABDOMEN PELVIS FINDINGS Hepatobiliary: No focal liver lesion. Cholecystectomy with similar biliary duct dilatation, with the common duct measuring 1.6 cm in the porta hepatis just above the pancreatic head today versus similar to on the prior (when remeasured). No obstructive stone or mass. Pancreas: Normal, without mass or ductal dilatation. Spleen: Normal in size, without focal abnormality. Adrenals/Urinary Tract: Normal adrenal glands. 3 mm  inter/lower pole right renal collecting system calculus. Suspect a punctate  interpolar left renal collecting system stone. Normal urinary bladder. Stomach/Bowel: Normal stomach, without wall thickening. The rectum and sigmoid demonstrate mild mucosal hyperenhancement, including on image 102/2. Normal terminal ileum. Normal small bowel. Vascular/Lymphatic: Aortic and branch vessel atherosclerosis. Small abdominal retroperitoneal nodes are not pathologic by size criteria and felt to be similar. index gastrohepatic ligament node is unchanged at 12 mm. No abdominopelvic adenopathy. Reproductive: Normal uterus and adnexa. Other: No significant free fluid. No evidence of omental or peritoneal disease. Presacral soft tissue thickening is similar. No significant free fluid. Musculoskeletal: Mild osteopenia. Lumbosacral spondylosis which is age advanced. Convex left lumbar spine curvature. IMPRESSION: CT CHEST IMPRESSION 1. Since the abdominal CT of 01/23/2019, mild progression of pulmonary metastasis. 2. No thoracic adenopathy. 3.  Aortic Atherosclerosis (ICD10-I70.0).  This is age advanced. CT ABDOMEN AND PELVIS IMPRESSION 1.  No  evidence of metastatic disease in the abdomen or pelvis. 2. Cholecystectomy with similar moderate common duct dilatation, without cause identified. 3. Presacral soft tissue thickening is unchanged and likely radiation induced. There is rectosigmoid mucosal hyperenhancement which is likely also radiation induced. Acute infectious or inflammatory colitis cannot be excluded. 4. Right and probable left nephrolithiasis. Electronically Signed   By: Abigail Miyamoto M.D.   On: 04/21/2019 14:20   Ct Abdomen Pelvis W Contrast  Result Date: 04/21/2019 CLINICAL DATA:  Cervical cancer with lung metastasis. Left chest pain and right lower quadrant pain. Metastatic cervical cancer. Nausea vomiting and diarrhea. EXAM: CT CHEST, ABDOMEN, AND PELVIS WITH CONTRAST TECHNIQUE: Multidetector CT imaging of the chest, abdomen and pelvis was performed following the standard protocol during bolus  administration of intravenous contrast. CONTRAST:  137m OMNIPAQUE IOHEXOL 300 MG/ML  SOLN COMPARISON:  Abdominopelvic CT 01/23/2019. Most recent chest CT 09/30/2018. FINDINGS: CT CHEST FINDINGS Cardiovascular: Aortic atherosclerosis. Normal heart size, without pericardial effusion. Right Port-A-Cath tip at high right atrium. No central pulmonary embolism, on this non-dedicated study. Mediastinum/Nodes: No supraclavicular adenopathy. No mediastinal or hilar adenopathy. Lungs/Pleura: No pleural fluid. Bilateral pulmonary nodules, many of which are cavitary. Comparing the nodules at the lung bases to the 01/23/2019 exam, an index right lung base 12 mm nasion on image 92/4 measured 11 mm on the prior exam (when remeasured). Index left lower lobe pulmonary nodule measures 9 mm on 115/4 versus 8 mm on the prior. A right middle lobe pulmonary nodule measures 8 mm on image 88/4 versus 7 mm on 01/23/2019. Musculoskeletal: No acute osseous abnormality. CT ABDOMEN PELVIS FINDINGS Hepatobiliary: No focal liver lesion. Cholecystectomy with similar biliary duct dilatation, with the common duct measuring 1.6 cm in the porta hepatis just above the pancreatic head today versus similar to on the prior (when remeasured). No obstructive stone or mass. Pancreas: Normal, without mass or ductal dilatation. Spleen: Normal in size, without focal abnormality. Adrenals/Urinary Tract: Normal adrenal glands. 3 mm inter/lower pole right renal collecting system calculus. Suspect a punctate interpolar left renal collecting system stone. Normal urinary bladder. Stomach/Bowel: Normal stomach, without wall thickening. The rectum and sigmoid demonstrate mild mucosal hyperenhancement, including on image 102/2. Normal terminal ileum. Normal small bowel. Vascular/Lymphatic: Aortic and branch vessel atherosclerosis. Small abdominal retroperitoneal nodes are not pathologic by size criteria and felt to be similar. index gastrohepatic ligament node is  unchanged at 12 mm. No abdominopelvic adenopathy. Reproductive: Normal uterus and adnexa. Other: No significant free fluid. No evidence of omental or peritoneal disease. Presacral soft tissue thickening is similar. No significant free fluid.  Musculoskeletal: Mild osteopenia. Lumbosacral spondylosis which is age advanced. Convex left lumbar spine curvature. IMPRESSION: CT CHEST IMPRESSION 1. Since the abdominal CT of 01/23/2019, mild progression of pulmonary metastasis. 2. No thoracic adenopathy. 3.  Aortic Atherosclerosis (ICD10-I70.0).  This is age advanced. CT ABDOMEN AND PELVIS IMPRESSION 1.  No  evidence of metastatic disease in the abdomen or pelvis. 2. Cholecystectomy with similar moderate common duct dilatation, without cause identified. 3. Presacral soft tissue thickening is unchanged and likely radiation induced. There is rectosigmoid mucosal hyperenhancement which is likely also radiation induced. Acute infectious or inflammatory colitis cannot be excluded. 4. Right and probable left nephrolithiasis. Electronically Signed   By: Abigail Miyamoto M.D.   On: 04/21/2019 14:20    Assessment and plan- Patient is a 53 y.o. 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  1.  Cervical adenocarcinoma-metastases to lung-most recently on Taxol.  CT abdomen pelvis April 2020 showed no evidence of progressive disease in the abdomen/pelvis with stable bowel thickening, lung nodules/multiple cavitary lesions are increased by few millimeters.  Taxol was stopped due to poor tolerance.  She initiated cycle 1 of palliative Keytruda on 05/08/2019.   2.  Convalescence following immunotherapy-symptoms consistent with side effects of immunotherapy and overall sound grade 1-2.  Encouraged use of home medications as prescribed.  If symptoms worsen she can be evaluated in ER versus urgent care over the weekend or call  our on-call provider.   3.  Chronic diarrhea-symptoms not consistent with immune mediated colitis.  Suspect intermittent worsening of chronic diarrhea.  Continue home antidiarrheal agents.  In setting of immunotherapy would attempt to minimize steroids.  Awaiting evaluation with surgery for possible colostomy in August.  4.  Itching-no rash.  Itching resolved with Atarax.  In setting of immunotherapy monitor for possible dermatologic toxicities however symptoms resolved today.  Plan -Continue current scope of treatment -Continue current supportive care - Return to clinic as scheduled or sooner if symptoms persist or worsen.    Visit Diagnosis 1. Convalescence following chemotherapy     Patient expressed understanding and was in agreement with this plan. She also understands that She can call clinic at any time with any questions, concerns, or complaints.   I discussed the assessment and treatment plan with the patient. The patient was provided an opportunity to ask questions and all were answered. The patient agreed with the plan and demonstrated an understanding of the instructions.   The patient was advised to call back or seek an in-person evaluation if the symptoms worsen or if the condition fails to improve as anticipated.   I provided 17 minutes of non face-to-face telephone visit time during this encounter, and > 50% was spent counseling as documented under my assessment & plan.  Thank you for allowing me to participate in the care of this pleasant patient.   Beckey Rutter, DNP, AGNP-C [facility] (work cell) [phone] (office)  CC: Dr. Rogue Bussing

## 2019-05-14 ENCOUNTER — Other Ambulatory Visit: Payer: Self-pay

## 2019-05-15 ENCOUNTER — Other Ambulatory Visit: Payer: Self-pay

## 2019-05-15 ENCOUNTER — Inpatient Hospital Stay: Payer: Medicaid Other

## 2019-05-15 DIAGNOSIS — Z95828 Presence of other vascular implants and grafts: Secondary | ICD-10-CM

## 2019-05-15 DIAGNOSIS — Z5112 Encounter for antineoplastic immunotherapy: Secondary | ICD-10-CM | POA: Diagnosis not present

## 2019-05-15 DIAGNOSIS — C538 Malignant neoplasm of overlapping sites of cervix uteri: Secondary | ICD-10-CM

## 2019-05-15 LAB — CBC WITH DIFFERENTIAL/PLATELET
Abs Immature Granulocytes: 0.02 10*3/uL (ref 0.00–0.07)
Basophils Absolute: 0 10*3/uL (ref 0.0–0.1)
Basophils Relative: 0 %
Eosinophils Absolute: 0.1 10*3/uL (ref 0.0–0.5)
Eosinophils Relative: 1 %
HCT: 33 % — ABNORMAL LOW (ref 36.0–46.0)
Hemoglobin: 10.8 g/dL — ABNORMAL LOW (ref 12.0–15.0)
Immature Granulocytes: 0 %
Lymphocytes Relative: 25 %
Lymphs Abs: 1.2 10*3/uL (ref 0.7–4.0)
MCH: 33.8 pg (ref 26.0–34.0)
MCHC: 32.7 g/dL (ref 30.0–36.0)
MCV: 103.1 fL — ABNORMAL HIGH (ref 80.0–100.0)
Monocytes Absolute: 0.3 10*3/uL (ref 0.1–1.0)
Monocytes Relative: 6 %
Neutro Abs: 3.2 10*3/uL (ref 1.7–7.7)
Neutrophils Relative %: 68 %
Platelets: 171 10*3/uL (ref 150–400)
RBC: 3.2 MIL/uL — ABNORMAL LOW (ref 3.87–5.11)
RDW: 13.5 % (ref 11.5–15.5)
WBC: 4.8 10*3/uL (ref 4.0–10.5)
nRBC: 0 % (ref 0.0–0.2)

## 2019-05-15 LAB — COMPREHENSIVE METABOLIC PANEL
ALT: 32 U/L (ref 0–44)
AST: 32 U/L (ref 15–41)
Albumin: 3.9 g/dL (ref 3.5–5.0)
Alkaline Phosphatase: 139 U/L — ABNORMAL HIGH (ref 38–126)
Anion gap: 7 (ref 5–15)
BUN: 16 mg/dL (ref 6–20)
CO2: 25 mmol/L (ref 22–32)
Calcium: 9.2 mg/dL (ref 8.9–10.3)
Chloride: 106 mmol/L (ref 98–111)
Creatinine, Ser: 0.73 mg/dL (ref 0.44–1.00)
GFR calc Af Amer: >60 mL/min (ref >60)
GFR calc non Af Amer: >60 mL/min (ref >60)
Glucose, Bld: 105 mg/dL — ABNORMAL HIGH (ref 70–99)
Potassium: 3.6 mmol/L (ref 3.5–5.1)
Sodium: 138 mmol/L (ref 135–145)
Total Bilirubin: 0.5 mg/dL (ref 0.3–1.2)
Total Protein: 7.4 g/dL (ref 6.5–8.1)

## 2019-05-15 LAB — MAGNESIUM: Magnesium: 1.6 mg/dL — ABNORMAL LOW (ref 1.7–2.4)

## 2019-05-15 MED ORDER — HEPARIN SOD (PORK) LOCK FLUSH 100 UNIT/ML IV SOLN
500.0000 [IU] | Freq: Once | INTRAVENOUS | Status: AC | PRN
  Administered 2019-05-15: 500 [IU]
  Filled 2019-05-15: qty 5

## 2019-05-15 MED ORDER — MAGNESIUM SULFATE 4 GM/100ML IV SOLN: 4.0000 g | Freq: Once | INTRAVENOUS | Status: DC

## 2019-05-15 MED ORDER — SODIUM CHLORIDE 0.9 % IV SOLN
Freq: Once | INTRAVENOUS | Status: AC
  Administered 2019-05-15: 09:00:00 via INTRAVENOUS
  Filled 2019-05-15: qty 250

## 2019-05-15 MED ORDER — SODIUM CHLORIDE 0.9% FLUSH
10.0000 mL | Freq: Once | INTRAVENOUS | Status: AC
  Administered 2019-05-15: 10 mL via INTRAVENOUS
  Filled 2019-05-15: qty 10

## 2019-05-18 ENCOUNTER — Other Ambulatory Visit: Payer: Self-pay

## 2019-05-18 ENCOUNTER — Emergency Department: Payer: Medicaid Other

## 2019-05-18 ENCOUNTER — Inpatient Hospital Stay
Admission: EM | Admit: 2019-05-18 | Discharge: 2019-05-22 | DRG: 556 | Disposition: A | Discharge status: Home / Self Care | Payer: Medicaid Other | Attending: Internal Medicine | Admitting: Internal Medicine

## 2019-05-18 ENCOUNTER — Encounter: Payer: Self-pay | Admitting: Emergency Medicine

## 2019-05-18 DIAGNOSIS — Z8619 Personal history of other infectious and parasitic diseases: Secondary | ICD-10-CM

## 2019-05-18 DIAGNOSIS — R52 Pain, unspecified: Secondary | ICD-10-CM | POA: Diagnosis present

## 2019-05-18 DIAGNOSIS — Z96652 Presence of left artificial knee joint: Secondary | ICD-10-CM | POA: Diagnosis present

## 2019-05-18 DIAGNOSIS — C7802 Secondary malignant neoplasm of left lung: Secondary | ICD-10-CM | POA: Diagnosis present

## 2019-05-18 DIAGNOSIS — Z79891 Long term (current) use of opiate analgesic: Secondary | ICD-10-CM

## 2019-05-18 DIAGNOSIS — I10 Essential (primary) hypertension: Secondary | ICD-10-CM | POA: Diagnosis present

## 2019-05-18 DIAGNOSIS — Z7901 Long term (current) use of anticoagulants: Secondary | ICD-10-CM

## 2019-05-18 DIAGNOSIS — Z79899 Other long term (current) drug therapy: Secondary | ICD-10-CM

## 2019-05-18 DIAGNOSIS — M542 Cervicalgia: Secondary | ICD-10-CM

## 2019-05-18 DIAGNOSIS — Z9221 Personal history of antineoplastic chemotherapy: Secondary | ICD-10-CM

## 2019-05-18 DIAGNOSIS — K219 Gastro-esophageal reflux disease without esophagitis: Secondary | ICD-10-CM | POA: Diagnosis present

## 2019-05-18 DIAGNOSIS — C538 Malignant neoplasm of overlapping sites of cervix uteri: Secondary | ICD-10-CM | POA: Diagnosis present

## 2019-05-18 DIAGNOSIS — Z86711 Personal history of pulmonary embolism: Secondary | ICD-10-CM

## 2019-05-18 DIAGNOSIS — Z884 Allergy status to anesthetic agent status: Secondary | ICD-10-CM

## 2019-05-18 DIAGNOSIS — J449 Chronic obstructive pulmonary disease, unspecified: Secondary | ICD-10-CM | POA: Diagnosis present

## 2019-05-18 DIAGNOSIS — D6851 Activated protein C resistance: Secondary | ICD-10-CM | POA: Diagnosis present

## 2019-05-18 DIAGNOSIS — R079 Chest pain, unspecified: Secondary | ICD-10-CM | POA: Diagnosis present

## 2019-05-18 DIAGNOSIS — M25512 Pain in left shoulder: Principal | ICD-10-CM | POA: Diagnosis present

## 2019-05-18 DIAGNOSIS — Z20828 Contact with and (suspected) exposure to other viral communicable diseases: Secondary | ICD-10-CM | POA: Diagnosis present

## 2019-05-18 DIAGNOSIS — C7801 Secondary malignant neoplasm of right lung: Secondary | ICD-10-CM | POA: Diagnosis present

## 2019-05-18 DIAGNOSIS — Z86718 Personal history of other venous thrombosis and embolism: Secondary | ICD-10-CM

## 2019-05-18 DIAGNOSIS — Z9049 Acquired absence of other specified parts of digestive tract: Secondary | ICD-10-CM

## 2019-05-18 DIAGNOSIS — M4802 Spinal stenosis, cervical region: Secondary | ICD-10-CM | POA: Diagnosis present

## 2019-05-18 DIAGNOSIS — M79602 Pain in left arm: Secondary | ICD-10-CM

## 2019-05-18 DIAGNOSIS — G893 Neoplasm related pain (acute) (chronic): Secondary | ICD-10-CM | POA: Diagnosis present

## 2019-05-18 DIAGNOSIS — Z87891 Personal history of nicotine dependence: Secondary | ICD-10-CM

## 2019-05-18 DIAGNOSIS — M47812 Spondylosis without myelopathy or radiculopathy, cervical region: Secondary | ICD-10-CM | POA: Diagnosis present

## 2019-05-18 DIAGNOSIS — E2740 Unspecified adrenocortical insufficiency: Secondary | ICD-10-CM | POA: Diagnosis present

## 2019-05-18 DIAGNOSIS — I700 Atherosclerosis of aorta: Secondary | ICD-10-CM | POA: Diagnosis present

## 2019-05-18 DIAGNOSIS — E559 Vitamin D deficiency, unspecified: Secondary | ICD-10-CM | POA: Diagnosis present

## 2019-05-18 DIAGNOSIS — K529 Noninfective gastroenteritis and colitis, unspecified: Secondary | ICD-10-CM | POA: Diagnosis present

## 2019-05-18 LAB — CBC WITH DIFFERENTIAL/PLATELET
Abs Immature Granulocytes: 0.02 10*3/uL (ref 0.00–0.07)
Basophils Absolute: 0 10*3/uL (ref 0.0–0.1)
Basophils Relative: 0 %
Eosinophils Absolute: 0.1 10*3/uL (ref 0.0–0.5)
Eosinophils Relative: 2 %
HCT: 37 % (ref 36.0–46.0)
Hemoglobin: 12.3 g/dL (ref 12.0–15.0)
Immature Granulocytes: 1 %
Lymphocytes Relative: 16 %
Lymphs Abs: 0.7 10*3/uL (ref 0.7–4.0)
MCH: 34.1 pg — ABNORMAL HIGH (ref 26.0–34.0)
MCHC: 33.2 g/dL (ref 30.0–36.0)
MCV: 102.5 fL — ABNORMAL HIGH (ref 80.0–100.0)
Monocytes Absolute: 0.3 10*3/uL (ref 0.1–1.0)
Monocytes Relative: 6 %
Neutro Abs: 3 10*3/uL (ref 1.7–7.7)
Neutrophils Relative %: 75 %
Platelets: 168 10*3/uL (ref 150–400)
RBC: 3.61 MIL/uL — ABNORMAL LOW (ref 3.87–5.11)
RDW: 13.4 % (ref 11.5–15.5)
WBC: 4.1 10*3/uL (ref 4.0–10.5)
nRBC: 0 % (ref 0.0–0.2)

## 2019-05-18 LAB — COMPREHENSIVE METABOLIC PANEL
ALT: 51 U/L — ABNORMAL HIGH (ref 0–44)
AST: 37 U/L (ref 15–41)
Albumin: 3.8 g/dL (ref 3.5–5.0)
Alkaline Phosphatase: 181 U/L — ABNORMAL HIGH (ref 38–126)
Anion gap: 10 (ref 5–15)
BUN: 11 mg/dL (ref 6–20)
CO2: 25 mmol/L (ref 22–32)
Calcium: 9 mg/dL (ref 8.9–10.3)
Chloride: 101 mmol/L (ref 98–111)
Creatinine, Ser: 0.69 mg/dL (ref 0.44–1.00)
GFR calc Af Amer: >60 mL/min (ref >60)
GFR calc non Af Amer: >60 mL/min (ref >60)
Glucose, Bld: 101 mg/dL — ABNORMAL HIGH (ref 70–99)
Potassium: 3.6 mmol/L (ref 3.5–5.1)
Sodium: 136 mmol/L (ref 135–145)
Total Bilirubin: 0.7 mg/dL (ref 0.3–1.2)
Total Protein: 7.7 g/dL (ref 6.5–8.1)

## 2019-05-18 LAB — TROPONIN I (HIGH SENSITIVITY)
Troponin I (High Sensitivity): 4 ng/L (ref <18)
Troponin I (High Sensitivity): 6 ng/L (ref <18)

## 2019-05-18 MED ORDER — SODIUM CHLORIDE 0.9 % IV BOLUS
1000.0000 mL | Freq: Once | INTRAVENOUS | Status: AC
  Administered 2019-05-18: 1000 mL via INTRAVENOUS

## 2019-05-18 MED ORDER — HYDROMORPHONE HCL 1 MG/ML IJ SOLN
0.5000 mg | Freq: Once | INTRAMUSCULAR | Status: AC
  Administered 2019-05-18: 0.5 mg via INTRAVENOUS

## 2019-05-18 MED ORDER — LORAZEPAM 2 MG/ML IJ SOLN
1.0000 mg | Freq: Once | INTRAMUSCULAR | Status: DC
  Filled 2019-05-18: qty 1

## 2019-05-18 MED ORDER — MORPHINE SULFATE (PF) 4 MG/ML IV SOLN
4.0000 mg | Freq: Once | INTRAVENOUS | Status: AC
  Administered 2019-05-18: 4 mg via INTRAVENOUS
  Filled 2019-05-18: qty 1

## 2019-05-18 MED ORDER — ONDANSETRON HCL 4 MG/2ML IJ SOLN
4.0000 mg | Freq: Once | INTRAMUSCULAR | Status: AC
  Administered 2019-05-18: 14:00:00 4 mg via INTRAVENOUS
  Filled 2019-05-18: qty 2

## 2019-05-18 MED ORDER — HYDROMORPHONE HCL 1 MG/ML IJ SOLN
0.5000 mg | Freq: Once | INTRAMUSCULAR | Status: AC
  Administered 2019-05-18: 0.5 mg via INTRAVENOUS
  Filled 2019-05-18: qty 1

## 2019-05-18 MED ORDER — IOHEXOL 300 MG/ML  SOLN
75.0000 mL | Freq: Once | INTRAMUSCULAR | Status: AC | PRN
  Administered 2019-05-18: 75 mL via INTRAVENOUS

## 2019-05-18 MED ORDER — LORAZEPAM 2 MG/ML IJ SOLN
1.0000 mg | Freq: Once | INTRAMUSCULAR | Status: AC
  Administered 2019-05-18: 1 mg via INTRAVENOUS

## 2019-05-18 MED ORDER — HYDROMORPHONE HCL 1 MG/ML IJ SOLN: 0.5000 mg | Freq: Once | INTRAMUSCULAR | Status: DC

## 2019-05-18 MED ORDER — HYDROMORPHONE HCL 1 MG/ML IJ SOLN
0.5000 mg | Freq: Once | INTRAMUSCULAR | Status: AC
  Administered 2019-05-18: 18:00:00 0.5 mg via INTRAVENOUS
  Filled 2019-05-18: qty 1

## 2019-05-18 MED ORDER — LORAZEPAM 1 MG PO TABS: 1.0000 mg | ORAL_TABLET | Freq: Once | ORAL | Status: DC

## 2019-05-18 NOTE — ED Notes (Signed)
This RN contacted MRI. MRI tech st rm is not available at this time, will have to calll when we have one available"

## 2019-05-18 NOTE — ED Provider Notes (Signed)
[facility] Emergency Department Provider Note   ____________________________________________   First MD Initiated Contact with Patient 05/18/19 1234     (approximate)  I have reviewed the triage vital signs and the nursing notes.   HISTORY  Chief Complaint Neck Pain and Shoulder Pain   HPI Joanna Hall is a 53 y.o. female who complains of pain in the left neck left arm and left chest starting on Thursday.  Is worse with any movement.  Is been gradually getting worse since Thursday and is now to the point where her left arm is swelling.  Pain is severe deep and achy and achy and worse with movement.  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         Past Medical History:  Diagnosis Date   Abdominal pain 06/10/2018   Abnormal cervical Papanicolaou smear 09/18/2017   Anxiety    Aortic atherosclerosis (HCC)    Arthritis    neck and knees   Blood clots in brain    both lungs and right kidney   Blood transfusion without reported diagnosis    Cervical cancer ([place]) 09/2016   mets lung   Chronic anal fissure    Chronic diarrhea    Dyspnea    Erosive gastropathy 09/18/2017   Factor V Leiden mutation ([place])    Fecal incontinence    Genital warts    GERD (gastroesophageal reflux disease)    Heart murmur    Hematochezia    Hemorrhoids    Hepatitis C    Chronic, 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  Negative test 11/9796   Ileocolic anastomotic leak    Infarction of kidney (HCC) left kidney   and uterus   Intestinal infection due to Clostridium difficile 09/18/2017   Macrocytic anemia with vitamin B12 deficiency    Multiple gastric ulcers    Nausea vomiting and diarrhea    Pancolitis (HCC) 07/27/2018    Perianal condylomata    Pneumonia    History of   Pulmonary nodules    Rectal bleeding    Small bowel obstruction ([place]) 08/2017   Stiff neck    limited right turn   Vitamin D deficiency     Patient Active Problem List   Diagnosis Date Noted   Cancer associated pain    Proctitis, radiation    Palliative care encounter    Encounter for monitoring opioid maintenance therapy 11/19/2018   GI bleed 10/08/2018   Adrenal insufficiency ([place]) 09/03/2018   Chronic diarrhea 06/25/2018   Chronic anticoagulation 06/25/2018   Vomiting    Encounter for antineoplastic chemotherapy 06/04/2018   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   Lung metastasis ([place]) 05/15/2018   Closed fracture of distal end of radius 05/04/2018   History of total knee arthroplasty 04/24/2018   Osteoarthritis of left knee 02/28/2018   Condyloma acuminatum of anus s/p ablation 02/22/2018 02/22/2018   Condyloma acuminatum of vagina s/p ablation 02/22/2018 02/22/2018   Positive ANA (antinuclear antibody) 02/04/2018   Aortic atherosclerosis ([place]) 01/15/2018   Elevated MCV 01/15/2018   Anemia 01/15/2018   Genital warts 01/15/2018   Vitamin D deficiency 01/15/2018   Pernicious anemia    Impingement syndrome of shoulder region 12/19/2017   Multiple joint pain 12/19/2017  Osteoarthritis of right knee 12/19/2017   B12 deficiency 12/11/2017   Lung nodules 12/11/2017   History of Clostridium difficile infection 10/23/2017   Factor V Leiden ([place]) 10/19/2017   Goals of care, counseling/discussion 10/19/2017   Cervical cancer, FIGO stage IB1 ([place]) 09/18/2017   Chronic hepatitis C without hepatic coma ([place]) 09/18/2017   Cytopenia 09/18/2017   Diarrhea 09/18/2017   Lower abdominal pain 09/18/2017   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    Past Surgical History:  Procedure Laterality Date   CHOLECYSTECTOMY     COLON SURGERY  08/2017   resection   COLONOSCOPY WITH PROPOFOL N/A 12/20/2017   Procedure: COLONOSCOPY WITH PROPOFOL;  Surgeon: [name], MD;  Location: [facility] ENDOSCOPY;  Service: Gastroenterology;  Laterality: N/A;   COLONOSCOPY WITH PROPOFOL N/A 07/30/2018   Procedure: COLONOSCOPY WITH PROPOFOL;  Surgeon: [name], MD;  Location: [facility] ENDOSCOPY;  Service: Gastroenterology;  Laterality: N/A;   COLONOSCOPY WITH PROPOFOL N/A 10/10/2018   Procedure: COLONOSCOPY WITH PROPOFOL;  Surgeon: Lucilla Lame, MD;  Location: [facility] ENDOSCOPY;  Service: Endoscopy;  Laterality: N/A;   DIAGNOSTIC LAPAROSCOPY     ESOPHAGOGASTRODUODENOSCOPY (EGD) WITH PROPOFOL N/A 12/20/2017   Procedure: ESOPHAGOGASTRODUODENOSCOPY (EGD) WITH PROPOFOL;  Surgeon: [name], MD;  Location: [facility] ENDOSCOPY;  Service: Gastroenterology;  Laterality: N/A;   ESOPHAGOGASTRODUODENOSCOPY (EGD) WITH PROPOFOL  07/30/2018   Procedure: ESOPHAGOGASTRODUODENOSCOPY (EGD) WITH PROPOFOL;  Surgeon: [name], MD;  Location: [facility] ENDOSCOPY;  Service: Gastroenterology;;   [place] N/A 11/21/2018   Procedure: FLEXIBLE SIGMOIDOSCOPY;  Surgeon: [name], MD;  Location: [facility] ENDOSCOPY;  Service: Gastroenterology;  Laterality: N/A;   LAPAROTOMY N/A 08/31/2017   Procedure: EXPLORATORY LAPAROTOMY for SBO, ileocolectomy, removal of piece of uterine wall;  Surgeon: [name], MD;  Location: [facility] ORS;  Service: General;  Laterality: N/A;   LASER ABLATION CONDOLAMATA N/A  02/22/2018   Procedure: LASER ABLATION/REMOVAL OF POEUMPNTIRW AROUND ANUS AND VAGINA;  Surgeon: Michael Boston, MD;  Location: [place];  Service: General;  Laterality: N/A;   OOPHORECTOMY     PORTA CATH INSERTION N/A 05/13/2018   Procedure: PORTA CATH INSERTION;  Surgeon: Katha Cabal, MD;  Location: [place] CV LAB;  Service: Cardiovascular;  Laterality: N/A;   SMALL INTESTINE SURGERY     TANDEM RING INSERTION     x3   THORACOTOMY     TOTAL KNEE ARTHROPLASTY Left 04/24/2018   Procedure: TOTAL KNEE ARTHROPLASTY;  Surgeon: Lovell Sheehan, MD;  Location: [facility] ORS;  Service: Orthopedics;  Laterality: Left;    Prior to Admission medications   Medication Sig Start Date End Date Taking? Authorizing Provider  amitriptyline (ELAVIL) 75 MG tablet Take 1 tablet (75 mg total) by mouth at bedtime. 05/06/19 08/04/19 Yes Vanga, Tally Due, MD  Calcium Carb-Cholecalciferol (CALCIUM 500 +D) 500-400 MG-UNIT TABS Take 2 tablets by mouth daily.   Yes [provider]  Eluxadoline (VIBERZI) 100 MG TABS Take 1 tablet (100 mg total) by mouth 2 (two) times daily. 04/21/19 05/21/19 Yes Vanga, Tally Due, MD  famotidine (PEPCID)  20 MG tablet Take 1 tablet (20 mg total) by mouth 2 (two) times daily as needed for heartburn or indigestion. 11/25/18  Yes Gouru, Illene Silver, MD  hydrOXYzine (ATARAX/VISTARIL) 10 MG tablet Take 1 tablet (10 mg total) by mouth 3 (three) times daily as needed. 05/08/19  Yes Cammie Sickle, MD  hyoscyamine (LEVBID) 0.375 MG 12 hr tablet Take 1 tablet (0.375 mg total)by mouth 2 (two) times daily. 01/03/19  Yes Vanga, Tally Due, MD  Multiple Vitamins-Minerals (MULTIVITAMIN WITH MINERALS) tablet Take 1 tablet by mouth daily. 08/01/18 08/01/19 Yes Wieting, Richard, MD  ondansetron (ZOFRAN ODT) 4 MG disintegrating tablet Take 1 tablet (4 mg total) by mouth every 8 (eight) hours as needed for nausea or vomiting. 03/20/19  Yes Verlon Au, NP  oxyCODONE  (OXYCONTIN) 15 mg 12 hr tablet Take 1 tablet (15 mg total) by mouth every 12 (twelve) hours. 04/25/19  Yes Verlon Au, NP  Oxycodone HCl 10 MG TABS Take 1 tablet (10 mg total) by mouth every 6 (six) hours as needed (for breakthrough pain). 05/09/19  Yes Verlon Au, NP  pantoprazole (PROTONIX) 40 MG tablet Take 1 tablet (40 mg total) by mouth daily. 03/18/19  Yes Cammie Sickle, MD  potassium chloride SA (K-DUR,KLOR-CON) 20 MEQ tablet Take 1 tablet (20 mEq total) by mouth daily. Patient taking differently: Take 20 mEq by mouth 2 (two) times daily.  01/01/19  Yes Cammie Sickle, MD  rivaroxaban (XARELTO) 20 MG TABS tablet Take 1 tablet (20 mg total) by mouth daily with supper. Patient taking differently: Take 20 mg by mouth daily with supper. Takes in the morning 05/05/19  Yes Cammie Sickle, MD  diphenoxylate-atropine (LOMOTIL) 2.5-0.025 MG tablet TAKE (2) TABLETS FOUR TIMES DAILY. 04/21/19   [name], MD  meclizine (ANTIVERT) 25 MG tablet Take 1 tablet (25 mg total) by mouth 2 (two) times daily as needed for dizziness. 01/26/19   Merlyn Lot, MD  promethazine (PHENERGAN) 25 MG tablet Take 1 tablet (25 mg total) by mouth every 8 (eight) hours as needed for nausea or vomiting. 03/05/19   Earlie Server, MD  VENTOLIN HFA 108 (90 Base) MCG/ACT inhaler Inhale 1-2 puffs into the lungs every 4 (four) hours as needed for shortness of breath. 02/19/19   Verlon Au, NP    Allergies Ketamine  Family History  Problem Relation Age of Onset   Hypertension Father    Diabetes Father    Alcohol abuse Daughter    Hypertension Maternal Grandmother    Diabetes Maternal Grandmother    Diabetes Paternal Grandmother    Hypertension Paternal Grandmother     Social History Social History   Tobacco Use   Smoking status: Former Smoker    Quit date: 10/16/2006    Years since quitting: 12.5   Smokeless tobacco: Never Used  Substance Use Topics   Alcohol use: Not  Currently    Frequency: Never    Comment: seldom   Drug use: Yes    Types: Marijuana    Review of Systems  Constitutional: No fever/chills Eyes: No visual changes. ENT: No sore throat. Cardiovascular: See HPI Respiratory: Denies shortness of breath. Gastrointestinal: No abdominal pain.  No nausea, no vomiting.  No diarrhea.  No constipation. Genitourinary: Negative for dysuria. Musculoskeletal: Negative for back pain. Skin: Negative for rash. Neurological: Negative for headaches, focal weakness   ____________________________________________   PHYSICAL EXAM:  VITAL SIGNS: ED Triage Vitals  Enc Vitals Group     BP  05/18/19 0919 (!) 132/92     Pulse Rate 05/18/19 0919 81     Resp 05/18/19 0919 16     Temp 05/18/19 0919 98.8 F (37.1 C)     Temp src --      SpO2 05/18/19 0919 99 %     Weight --      Height --      Head Circumference --      Peak Flow --      Pain Score 05/18/19 0923 10     Pain Loc --      Pain Edu? --      Excl. in [place]? --     Constitutional: Alert and oriented.  Looks ill and in pain Eyes: Conjunctivae are normal.  Head: Atraumatic. Nose: No congestion/rhinnorhea. Mouth/Throat: Mucous membranes are moist.  Oropharynx non-erythematous. Neck: No stridor.   Cardiovascular: Normal rate, regular rhythm. Grossly normal heart sounds.  Good peripheral circulation. Respiratory: Normal respiratory effort.  No retractions. Lungs CTAB. Gastrointestinal: Soft and nontender. No distention. No abdominal bruits. No CVA tenderness. Musculoskeletal: No lower extremity tenderness nor edema.  Slight swelling of the left hand and arm compared to the right.  A lot of pain on movement of the right arm especially the shoulder.  Although gripping with the left hand also produces pain in the shoulder and neck and chest. Neurologic:  Normal speech and language. No gross focal neurologic deficits are appreciated.  Exam is very limited by pain in the arm and shoulder Skin:   Skin is warm, dry and intact. No rash noted. .  ____________________________________________   LABS (all labs ordered are listed, but only abnormal results are displayed)  Labs Reviewed  CBC WITH DIFFERENTIAL/PLATELET - Abnormal; Notable for the following components:      Result Value   RBC 3.61 (*)    MCV 102.5 (*)    MCH 34.1 (*)    All other components within normal limits  COMPREHENSIVE METABOLIC PANEL - Abnormal; Notable for the following components:   Glucose, Bld 101 (*)    ALT 51 (*)    Alkaline Phosphatase 181 (*)    All other components within normal limits  TROPONIN I (HIGH SENSITIVITY)  TROPONIN I (HIGH SENSITIVITY)   ____________________________________________  EKG  EKG read and interpreted by me shows normal sinus rhythm rate of 81 normal axis I do not see any acute ST-T wave changes. ____________________________________________  RADIOLOGY  ED MD interpretation: CT of the head neck and chest showed progression of the lung disease but nothing in the neck or head.  I reviewed the films  Official radiology report(s): Ct Head W Or Wo Contrast  Result Date: 05/18/2019 CLINICAL DATA:  54 year old female with history of metastatic cervical cancer. Severe pain radiating from the skull base through the neck and left arm. EXAM: CT HEAD WITHOUT AND WITH CONTRAST CT NECK WITH CONTRAST TECHNIQUE: Contiguous axial images were obtained from the base of the skull through the vertex without and with intravenous contrast. Multidetector CT imaging of the and neck was performed using the standard protocol following the bolus administration of intravenous contrast. CONTRAST:  85m OMNIPAQUE IOHEXOL 300 MG/ML  SOLN COMPARISON:  Head CT without contrast 01/26/2019. Chest CT today reported separately. FINDINGS: CT HEAD FINDINGS Brain: Stable and normal cerebral volume. No midline shift, ventriculomegaly, mass effect, evidence of mass lesion, intracranial hemorrhage or evidence of  cortically based acute infarction. Gray-white matter differentiation is within normal limits throughout the brain. No abnormal enhancement identified.  Vascular: The major intracranial vascular structures are enhancing as expected. Skull: Stable, negative. Sinuses/Orbits: Visualized paranasal sinuses and mastoids are stable and well pneumatized. Other: Visualized orbits and scalp soft tissues are within normal limits. CT NECK FINDINGS Pharynx and larynx: Laryngeal and pharyngeal soft tissue contours are within normal limits. Negative parapharyngeal and retropharyngeal spaces. Salivary glands: Negative sublingual space. Submandibular and parotid glands appear symmetric and normal. Thyroid: Negative. Lymph nodes: Negative.  No cervical lymphadenopathy. Vascular: Right IJ approach porta cath. The major vascular structures in the neck and at the skull base are patent. Mastoids and visualized paranasal sinuses: Clear. Skeleton: Absent dentition. Widespread advanced cervical disc and endplate degeneration. Upper cervical facet degeneration with mild degenerative appearing anterolisthesis of C3 on C4. Degenerative appearing bone mineralization heterogeneity in the odontoid. No acute or suspicious osseous lesion identified at the skull base or neck. Upper chest: Reported separately today. IMPRESSION: 1. No metastatic disease identified in the head or neck. 2.  Normal CT appearance of the brain. 3. Negative neck CT aside from widespread cervical spine degeneration. 4.  CT Chest today reported separately. Electronically Signed   By: Genevie Ann M.D.   On: 05/18/2019 14:42   Ct Soft Tissue Neck W Contrast  Result Date: 05/18/2019 CLINICAL DATA:  53 year old female with history of metastatic cervical cancer. Severe pain radiating from the skull base through the neck and left arm. EXAM: CT HEAD WITHOUT AND WITH CONTRAST CT NECK WITH CONTRAST TECHNIQUE: Contiguous axial images were obtained from the base of the skull through the  vertex without and with intravenous contrast. Multidetector CT imaging of the and neck was performed using the standard protocol following the bolus administration of intravenous contrast. CONTRAST:  46m OMNIPAQUE IOHEXOL 300 MG/ML  SOLN COMPARISON:  Head CT without contrast 01/26/2019. Chest CT today reported separately. FINDINGS: CT HEAD FINDINGS Brain: Stable and normal cerebral volume. No midline shift, ventriculomegaly, mass effect, evidence of mass lesion, intracranial hemorrhage or evidence of cortically based acute infarction. Gray-white matter differentiation is within normal limits throughout the brain. No abnormal enhancement identified. Vascular: The major intracranial vascular structures are enhancing as expected. Skull: Stable, negative. Sinuses/Orbits: Visualized paranasal sinuses and mastoids are stable and well pneumatized. Other: Visualized orbits and scalp soft tissues are within normal limits. CT NECK FINDINGS Pharynx and larynx: Laryngeal and pharyngeal soft tissue contours are within normal limits. Negative parapharyngeal and retropharyngeal spaces. Salivary glands: Negative sublingual space. Submandibular and parotid glands appear symmetric and normal. Thyroid: Negative. Lymph nodes: Negative.  No cervical lymphadenopathy. Vascular: Right IJ approach porta cath. The major vascular structures in the neck and at the skull base are patent. Mastoids and visualized paranasal sinuses: Clear. Skeleton: Absent dentition. Widespread advanced cervical disc and endplate degeneration. Upper cervical facet degeneration with mild degenerative appearing anterolisthesis of C3 on C4. Degenerative appearing bone mineralization heterogeneity in the odontoid. No acute or suspicious osseous lesion identified at the skull base or neck. Upper chest: Reported separately today. IMPRESSION: 1. No metastatic disease identified in the head or neck. 2.  Normal CT appearance of the brain. 3. Negative neck CT aside from  widespread cervical spine degeneration. 4.  CT Chest today reported separately. Electronically Signed   By: HGenevie AnnM.D.   On: 05/18/2019 14:42   Ct Chest W Contrast  Result Date: 05/18/2019 CLINICAL DATA:  Hx of cervical cancer with mets to the lungs. Severe pain from base of skull to chest and left arm. Concern for further mets. EXAM: CT CHEST WITH  CONTRAST TECHNIQUE: Multidetector CT imaging of the chest was performed during intravenous contrast administration. CONTRAST:  64m OMNIPAQUE IOHEXOL 300 MG/ML  SOLN COMPARISON:  04/21/2019 FINDINGS: Cardiovascular: Heart is normal in size and configuration. No pericardial effusion. Great vessels are normal in caliber. Minor aortic atherosclerosis. No dissection. Arch branch vessels are widely patent. Mediastinum/Nodes: Visualized thyroid gland is unremarkable. No neck base or axillary masses or enlarged lymph nodes. No mediastinal or hilar masses or adenopathy. Trachea and esophagus are unremarkable. Right internal jugular Port-A-Cath tip projects in the right atrium. Lungs/Pleura: Numerous bilateral metastatic pulmonary nodules with an increase in number and size compared to the prior CT. Largest nodule lies at the base of the right lower lobe, image 37, series 9, 14 mm, previously 12 mm. Nodule in the posteromedial base of the left lower lobe, image 44, measures 10 mm, previously 7 mm. Multiple nodules shows central cavitation. No evidence of pneumonia or pulmonary edema. No pleural effusion or pneumothorax. Upper Abdomen: No acute abnormality. Musculoskeletal: No fracture or acute finding. No osteoblastic or osteolytic lesions. IMPRESSION: 1. No acute findings in the chest. 2. Progression of metastatic disease to the lungs with an increase in the size and number of metastatic lung nodules. No other evidence of metastatic disease in the chest. Aortic Atherosclerosis (ICD10-I70.0). Electronically Signed   By: DLajean ManesM.D.   On: 05/18/2019 14:47   Mr  Cervical Spine Wo Contrast  Result Date: 05/18/2019 CLINICAL DATA:  LEFT neck and shoulder pain. Pain is severe, for 3 days. Unable to lift LEFT arm. EXAM: MRI CERVICAL SPINE WITHOUT CONTRAST TECHNIQUE: Multiplanar, multisequence MR imaging of the cervical spine was performed. No intravenous contrast was administered. COMPARISON:  CT of the neck earlier today. FINDINGS: The patient was unable to remain motionless for the exam. Small or subtle lesions could be overlooked. Alignment: Straightening, and slight reversal of the normal cervical lordosis. Trace anterolisthesis C2-3, C3-4, and C4-5 are facet mediated. Vertebrae: Endplate reactive changes, C3 through C7. No visible metastatic disease. Cord: Limited assessment, no definite impingement or abnormal signal. Posterior Fossa, vertebral arteries, paraspinal tissues: No tonsillar herniation. Vertebral flow voids are maintained. No neck masses. Disc levels: C2-3: Mild degenerative anterolisthesis. Annular bulge. No impingement. C3-4: Disc space narrowing. 2 mm degenerative anterolisthesis. Bulky RIGHT facet arthrosis. BILATERAL RIGHT greater than LEFT C4 foraminal narrowing. C4-5: Disc space narrowing with osseous spurring. 1-2 mm anterolisthesis. Bulky RIGHT facet arthropathy. RIGHT greater than LEFT C5 foraminal narrowing. C5-6: Disc space narrowing with osseous spurring. Mild stenosis. Shallow central protrusion. BILATERAL C6 foraminal narrowing due to uncinate spurring. C6-7: Disc space narrowing. Osseous spurring. Shallow central protrusion. Effacement anterior subarachnoid space. Borderline BILATERAL C7 foraminal narrowing. C7-T1: Unremarkable. IMPRESSION: Multilevel spondylosis as described. BILATERAL foraminal narrowing at C5-6 and C6-7 could affect the RIGHT or LEFT exiting nerve roots at those levels, but there is no clearly asymmetric or predominant LEFT-sided compressive lesion. Within limits for detection on this motion degraded exam, no metastatic  disease, epidural hematoma, significant spinal stenosis, or dominant/lateralizing disc protrusion. Electronically Signed   By: JStaci RighterM.D.   On: 05/18/2019 21:25   UKoreaVenous Img Upper Uni Left  Result Date: 05/18/2019 CLINICAL DATA:  Pain and swelling for 4 days. EXAM: LEFT UPPER EXTREMITY VENOUS DOPPLER ULTRASOUND TECHNIQUE: Gray-scale sonography with graded compression, as well as color Doppler and duplex ultrasound were performed to evaluate the upper extremity deep venous system from the level of the subclavian vein and including the jugular, axillary, basilic, radial, ulnar  and upper cephalic vein. Spectral Doppler was utilized to evaluate flow at rest and with distal augmentation maneuvers. COMPARISON:  None. FINDINGS: Contralateral Subclavian Vein: Respiratory phasicity is normal and symmetric with the symptomatic side. No evidence of thrombus. Normal compressibility. Internal Jugular Vein: No evidence of thrombus. Normal compressibility, respiratory phasicity and response to augmentation. Subclavian Vein: No evidence of thrombus. Normal compressibility, respiratory phasicity and response to augmentation. Axillary Vein: No evidence of thrombus. Normal compressibility, respiratory phasicity and response to augmentation. Cephalic Vein: No evidence of thrombus. Normal compressibility, respiratory phasicity and response to augmentation. Basilic Vein: No evidence of thrombus. Normal compressibility, respiratory phasicity and response to augmentation. Brachial Veins: No evidence of thrombus. Normal compressibility, respiratory phasicity and response to augmentation. Radial Veins: No evidence of thrombus. Normal compressibility, respiratory phasicity and response to augmentation. Ulnar Veins: No evidence of thrombus. Normal compressibility, respiratory phasicity and response to augmentation. Venous Reflux:  None visualized. Other Findings:  None visualized. IMPRESSION: No evidence of DVT within the left  upper extremity. Electronically Signed   By: Lovey Newcomer M.D.   On: 05/18/2019 10:40    ____________________________________________   PROCEDURES  Procedure(s) performed (including Critical Care):  Procedures   ____________________________________________   INITIAL IMPRESSION / ASSESSMENT AND PLAN / ED COURSE  Discussed the patient with Dr. Tasia Catchings at the cancer center.  Dr. Tasia Catchings suggest to get an MRI of the neck and if there is no impingement when admit the patient for pain control.  MRI does not show anything that looks focal that would explain her left-sided pain.  We will admit her for pain control.  She has already had multiple doses of Dilaudid.  Patient is troponins have been negative her blood work is okay EKG does not show anything specific I do not think that this pain is cardiac especially since is made much worse by any movement.  She is in a lot of pain even with the Dilaudid.              ____________________________________________   FINAL CLINICAL IMPRESSION(S) / ED DIAGNOSES  Final diagnoses:  Chest pain, unspecified type  Neck pain  Left arm pain     ED Discharge Orders    None       Note:  This document was prepared using Dragon voice recognition software and may include unintentional dictation errors.    Nena Polio, MD 05/18/19 [id number]

## 2019-05-18 NOTE — ED Notes (Signed)
Patient transported to Ultrasound 

## 2019-05-18 NOTE — ED Triage Notes (Addendum)
Pt to ED via POV c/o pain in her left neck and shoulder. Pt states that she is unable to lift her arm due to pain. Pt appears to be uncomfortable at this time.  Pt states that the pain started on Thursday and has gotten worse. Pt is also having swelling in the left hand. Pt denies known injury

## 2019-05-18 NOTE — ED Notes (Signed)
Pt back from CT

## 2019-05-18 NOTE — ED Notes (Signed)
Pt to CT

## 2019-05-18 NOTE — ED Notes (Signed)
Pt taken to MRI  

## 2019-05-18 NOTE — ED Notes (Signed)
Pt transported to MRI 

## 2019-05-18 NOTE — ED Notes (Addendum)
MRI brought pt back and stated that they could not perform MRI d/t pt becoming claustrophobic. Primary RN Carney Harder informed.

## 2019-05-18 NOTE — ED Notes (Signed)
Pt provided with phone to mother.

## 2019-05-18 NOTE — ED Notes (Signed)
Pt provided with warm blanket at this time.

## 2019-05-18 NOTE — H&P (Signed)
[facility] [place] NAME: Joanna Hall    MR#:  [id number]  DATE OF BIRTH:  05-Feb-1966  DATE OF ADMISSION:  05/18/2019  PRIMARY CARE PHYSICIAN: [facility], [place]   REQUESTING/REFERRING PHYSICIAN: Conni Slipper, MD  CHIEF COMPLAINT:   Chief Complaint  Patient presents with   Neck Pain   Shoulder Pain    HISTORY OF PRESENT ILLNESS:  Joanna Hall  is a 53 y.o. female with a known history of cervical cancer with lung metastasis, Leiden factor V, chronic nausea, vomiting, and diarrhea as well as intermittent fevers.  She follows up with Dr. Wilmer Floor.  She presented to the emergency room complaining of unrelenting pain in her left neck, left arm, and left chest which began on Thursday.  Pain has been waxing and waning since that time.  Pain is made worse with any movement of her upper body.  She describes the pain as deep and aching pain with a pain score 8-10 out of 10.  She notices no increase shortness of breath or lightheadedness associated with the pain.  She has no history of MI or CVA.  Chronic pain with metastatic cancer is being managed with OxyContin 15 mg every 12 hours and oxycodone 10 mg every 6 hours for breakthrough pain.  However patient reports this is no longer controlling her pain.  She was given Dilaudid IV in the emergency room with some relief.    Troponin is negative.  CT chest demonstrates mild progression of pulmonary metastasis.  CT abdomen and pelvis demonstrates no evidence of metastatic disease in the abdomen or pelvis.  No acute findings.  Venous ultrasound of left upper extremity is negative for DVT.  Cervical MRI demonstrates no metastatic disease, epidural hematoma, significant spinal stenosis, or dominant lateralizing disc protrusion.  She has been admitted to the hospitalist service for further evaluation and intermittent pain management.  PAST MEDICAL HISTORY:   Past Medical  History:  Diagnosis Date   Abdominal pain 06/10/2018   Abnormal cervical Papanicolaou smear 09/18/2017   Anxiety    Aortic atherosclerosis (HCC)    Arthritis    neck and knees   Blood clots in brain    both lungs and right kidney   Blood transfusion without reported diagnosis    Cervical cancer ([place]) 09/2016   mets lung   Chronic anal fissure    Chronic diarrhea    Dyspnea    Erosive gastropathy 09/18/2017   Factor V Leiden mutation ([place])    Fecal incontinence    Genital warts    GERD (gastroesophageal reflux disease)    Heart murmur    Hematochezia    Hemorrhoids    Hepatitis C    Chronic, 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   Intestinal infection due to Clostridium difficile 09/18/2017   Macrocytic anemia with vitamin B12 deficiency    Multiple gastric ulcers    Nausea vomiting and diarrhea    Pancolitis ([place]) 07/27/2018   Perianal condylomata    Pneumonia    History of   Pulmonary nodules    Rectal bleeding    Small bowel obstruction ([place]) 08/2017   Stiff  neck    limited right turn   Vitamin D deficiency     PAST SURGICAL HISTORY:   Past Surgical History:  Procedure Laterality Date   CHOLECYSTECTOMY     COLON SURGERY  08/2017   resection   COLONOSCOPY WITH PROPOFOL N/A 12/20/2017   Procedure: COLONOSCOPY WITH PROPOFOL;  Surgeon: [name], MD;  Location: [place];  Service: Gastroenterology;  Laterality: N/A;   COLONOSCOPY WITH PROPOFOL N/A 07/30/2018   Procedure: COLONOSCOPY WITH PROPOFOL;  Surgeon: [name], MD;  Location: [facility] ENDOSCOPY;  Service: Gastroenterology;  Laterality: N/A;   COLONOSCOPY WITH PROPOFOL N/A  10/10/2018   Procedure: COLONOSCOPY WITH PROPOFOL;  Surgeon: Lucilla Lame, MD;  Location: [facility] ENDOSCOPY;  Service: Endoscopy;  Laterality: N/A;   DIAGNOSTIC LAPAROSCOPY     ESOPHAGOGASTRODUODENOSCOPY (EGD) WITH PROPOFOL N/A 12/20/2017   Procedure: ESOPHAGOGASTRODUODENOSCOPY (EGD) WITH PROPOFOL;  Surgeon: [name], MD;  Location: [facility] ENDOSCOPY;  Service: Gastroenterology;  Laterality: N/A;   ESOPHAGOGASTRODUODENOSCOPY (EGD) WITH PROPOFOL  07/30/2018   Procedure: ESOPHAGOGASTRODUODENOSCOPY (EGD) WITH PROPOFOL;  Surgeon: [name], MD;  Location: [facility] ENDOSCOPY;  Service: Gastroenterology;;   [place] N/A 11/21/2018   Procedure: FLEXIBLE SIGMOIDOSCOPY;  Surgeon: [name], MD;  Location: [facility] ENDOSCOPY;  Service: Gastroenterology;  Laterality: N/A;   LAPAROTOMY N/A 08/31/2017   Procedure: EXPLORATORY LAPAROTOMY for SBO, ileocolectomy, removal of piece of uterine wall;  Surgeon: [name], MD;  Location: [facility] ORS;  Service: General;  Laterality: N/A;   LASER ABLATION CONDOLAMATA N/A 02/22/2018   Procedure: LASER ABLATION/REMOVAL OF [id number] AROUND ANUS AND VAGINA;  Surgeon: Michael Boston, MD;  Location: [place];  Service: General;  Laterality: N/A;   OOPHORECTOMY     PORTA CATH INSERTION N/A 05/13/2018   Procedure: PORTA CATH INSERTION;  Surgeon: Katha Cabal, MD;  Location: [place] CV LAB;  Service: Cardiovascular;  Laterality: N/A;   SMALL INTESTINE SURGERY     TANDEM RING INSERTION     x3   THORACOTOMY     TOTAL KNEE ARTHROPLASTY Left 04/24/2018   Procedure: TOTAL KNEE ARTHROPLASTY;  Surgeon: Lovell Sheehan, MD;  Location: [facility] ORS;  Service: Orthopedics;  Laterality: Left;    SOCIAL HISTORY:   Social History   Tobacco Use   Smoking status: Former Smoker    Quit date: 10/16/2006    Years since quitting: 12.5   Smokeless tobacco: Never Used  Substance Use Topics   Alcohol use: Not Currently     Frequency: Never    Comment: seldom    FAMILY HISTORY:   Family History  Problem Relation Age of Onset   Hypertension Father    Diabetes Father    Alcohol abuse Daughter    Hypertension Maternal Grandmother    Diabetes Maternal Grandmother    Diabetes Paternal Grandmother    Hypertension Paternal Grandmother     DRUG ALLERGIES:   Allergies  Allergen Reactions   Ketamine Anxiety and Other (See Comments)    Syncope episode/confusion     REVIEW OF SYSTEMS:   Review of Systems  Constitutional: Positive for chills, fever and malaise/fatigue.  HENT: Negative for congestion, sinus pain and sore throat.   Eyes: Negative for blurred vision and double vision.  Respiratory: Positive for cough, sputum production, shortness of breath and wheezing. Negative for hemoptysis and stridor.   Cardiovascular: Positive for chest pain. Negative for palpitations and leg swelling.  Gastrointestinal: Positive for abdominal pain, blood in stool, diarrhea, nausea and vomiting. Negative for  constipation, heartburn and melena.  Genitourinary: Negative for dysuria, flank pain, frequency, hematuria and urgency.  Musculoskeletal: Positive for back pain, joint pain, myalgias and neck pain. Negative for falls.  Skin: Negative for itching and rash.  Neurological: Positive for weakness and headaches. Negative for dizziness and loss of consciousness.  Psychiatric/Behavioral: Negative for depression.     MEDICATIONS AT HOME:   Prior to Admission medications   Medication Sig Start Date End Date Taking? Authorizing Provider  amitriptyline (ELAVIL) 75 MG tablet Take 1 tablet (75 mg total) by mouth at bedtime. 05/06/19 08/04/19 Yes Vanga, Tally Due, MD  Calcium Carb-Cholecalciferol (CALCIUM 500 +D) 500-400 MG-UNIT TABS Take 2 tablets by mouth daily.   Yes [provider]  Eluxadoline (VIBERZI) 100 MG TABS Take 1 tablet (100 mg total) by mouth 2 (two) times daily. 04/21/19 05/21/19 Yes Vanga,  Tally Due, MD  famotidine (PEPCID) 20 MG tablet Take 1 tablet (20 mg total) by mouth 2 (two) times daily as needed for heartburn or indigestion. 11/25/18  Yes Gouru, Illene Silver, MD  hydrOXYzine (ATARAX/VISTARIL) 10 MG tablet Take 1 tablet (10 mg total) by mouth 3 (three) times daily as needed. 05/08/19  Yes Cammie Sickle, MD  hyoscyamine (LEVBID) 0.375 MG 12 hr tablet Take 1 tablet (0.375 mg total)by mouth 2 (two) times daily. 01/03/19  Yes Vanga, Tally Due, MD  Multiple Vitamins-Minerals (MULTIVITAMIN WITH MINERALS) tablet Take 1 tablet by mouth daily. 08/01/18 08/01/19 Yes Wieting, Richard, MD  ondansetron (ZOFRAN ODT) 4 MG disintegrating tablet Take 1 tablet (4 mg total) by mouth every 8 (eight) hours as needed for nausea or vomiting. 03/20/19  Yes Verlon Au, NP  oxyCODONE (OXYCONTIN) 15 mg 12 hr tablet Take 1 tablet (15 mg total) by mouth every 12 (twelve) hours. 04/25/19  Yes Verlon Au, NP  Oxycodone HCl 10 MG TABS Take 1 tablet (10 mg total) by mouth every 6 (six) hours as needed (for breakthrough pain). 05/09/19  Yes Verlon Au, NP  pantoprazole (PROTONIX) 40 MG tablet Take 1 tablet (40 mg total) by mouth daily. 03/18/19  Yes Cammie Sickle, MD  potassium chloride SA (K-DUR,KLOR-CON) 20 MEQ tablet Take 1 tablet (20 mEq total) by mouth daily. Patient taking differently: Take 20 mEq by mouth 2 (two) times daily.  01/01/19  Yes Cammie Sickle, MD  rivaroxaban (XARELTO) 20 MG TABS tablet Take 1 tablet (20 mg total) by mouth daily with supper. Patient taking differently: Take 20 mg by mouth daily with supper. Takes in the morning 05/05/19  Yes Cammie Sickle, MD  diphenoxylate-atropine (LOMOTIL) 2.5-0.025 MG tablet TAKE (2) TABLETS FOUR TIMES DAILY. 04/21/19   [name], MD  meclizine (ANTIVERT) 25 MG tablet Take 1 tablet (25 mg total) by mouth 2 (two) times daily as needed for dizziness. 01/26/19   Merlyn Lot, MD  promethazine (PHENERGAN) 25 MG  tablet Take 1 tablet (25 mg total) by mouth every 8 (eight) hours as needed for nausea or vomiting. 03/05/19   Earlie Server, MD  VENTOLIN HFA 108 (90 Base) MCG/ACT inhaler Inhale 1-2 puffs into the lungs every 4 (four) hours as needed for shortness of breath. 02/19/19   Verlon Au, NP      VITAL SIGNS:  Blood pressure (!) 157/108, pulse 85, temperature 98.8 F (37.1 C), resp. rate 16, SpO2 97 %.  PHYSICAL EXAMINATION:  Physical Exam  GENERAL:  53 y.o.-year-old ill-appearing patient lying in the bed with no acute distress.  EYES: Pupils equal,  round, reactive to light and accommodation. No scleral icterus. Extraocular muscles intact.  HEENT: Head atraumatic, normocephalic. Oropharynx and nasopharynx clear.  NECK:  Supple, no jugular venous distention. No thyroid enlargement, no tenderness.  LUNGS: Normal breath sounds bilaterally, no wheezing, rales,rhonchi or crepitation. No use of accessory muscles of respiration.  CARDIOVASCULAR: Regular rate and rhythm, S1, S2 normal. No murmurs, rubs, or gallops.  ABDOMEN: Soft, nondistended, nontender. Bowel sounds present. No organomegaly or mass.  EXTREMITIES: Generalized mild edema.  Pain with range of motion NEUROLOGIC: Cranial nerves II through XII are intact. Muscle strength 5/5 in all extremities. Sensation intact. Gait not checked.  PSYCHIATRIC: The patient is alert and oriented x 3.  Normal affect and good eye contact. SKIN: No obvious rash, lesion, or ulcer.   LABORATORY PANEL:   CBC Recent Labs  Lab 05/18/19 0935  WBC 4.1  HGB 12.3  HCT 37.0  PLT 168   ------------------------------------------------------------------------------------------------------------------  Chemistries  Recent Labs  Lab 05/15/19 0822 05/18/19 0935  NA 138 136  K 3.6 3.6  CL 106 101  CO2 25 25  GLUCOSE 105* 101*  BUN 16 11  CREATININE 0.73 0.69  CALCIUM 9.2 9.0  MG 1.6*  --   AST 32 37  ALT 32 51*  ALKPHOS 139* 181*  BILITOT 0.5 0.7    ------------------------------------------------------------------------------------------------------------------  Cardiac Enzymes No results for input(s): TROPONINI in the last 168 hours. ------------------------------------------------------------------------------------------------------------------  RADIOLOGY:  Ct Head W Or Wo Contrast  Result Date: 05/18/2019 CLINICAL DATA:  53 year old female with history of metastatic cervical cancer. Severe pain radiating from the skull base through the neck and left arm. EXAM: CT HEAD WITHOUT AND WITH CONTRAST CT NECK WITH CONTRAST TECHNIQUE: Contiguous axial images were obtained from the base of the skull through the vertex without and with intravenous contrast. Multidetector CT imaging of the and neck was performed using the standard protocol following the bolus administration of intravenous contrast. CONTRAST:  49m OMNIPAQUE IOHEXOL 300 MG/ML  SOLN COMPARISON:  Head CT without contrast 01/26/2019. Chest CT today reported separately. FINDINGS: CT HEAD FINDINGS Brain: Stable and normal cerebral volume. No midline shift, ventriculomegaly, mass effect, evidence of mass lesion, intracranial hemorrhage or evidence of cortically based acute infarction. Gray-white matter differentiation is within normal limits throughout the brain. No abnormal enhancement identified. Vascular: The major intracranial vascular structures are enhancing as expected. Skull: Stable, negative. Sinuses/Orbits: Visualized paranasal sinuses and mastoids are stable and well pneumatized. Other: Visualized orbits and scalp soft tissues are within normal limits. CT NECK FINDINGS Pharynx and larynx: Laryngeal and pharyngeal soft tissue contours are within normal limits. Negative parapharyngeal and retropharyngeal spaces. Salivary glands: Negative sublingual space. Submandibular and parotid glands appear symmetric and normal. Thyroid: Negative. Lymph nodes: Negative.  No cervical lymphadenopathy.  Vascular: Right IJ approach porta cath. The major vascular structures in the neck and at the skull base are patent. Mastoids and visualized paranasal sinuses: Clear. Skeleton: Absent dentition. Widespread advanced cervical disc and endplate degeneration. Upper cervical facet degeneration with mild degenerative appearing anterolisthesis of C3 on C4. Degenerative appearing bone mineralization heterogeneity in the odontoid. No acute or suspicious osseous lesion identified at the skull base or neck. Upper chest: Reported separately today. IMPRESSION: 1. No metastatic disease identified in the head or neck. 2.  Normal CT appearance of the brain. 3. Negative neck CT aside from widespread cervical spine degeneration. 4.  CT Chest today reported separately. Electronically Signed   By: HGenevie AnnM.D.   On: 05/18/2019 14:42  Ct Soft Tissue Neck W Contrast  Result Date: 05/18/2019 CLINICAL DATA:  53 year old female with history of metastatic cervical cancer. Severe pain radiating from the skull base through the neck and left arm. EXAM: CT HEAD WITHOUT AND WITH CONTRAST CT NECK WITH CONTRAST TECHNIQUE: Contiguous axial images were obtained from the base of the skull through the vertex without and with intravenous contrast. Multidetector CT imaging of the and neck was performed using the standard protocol following the bolus administration of intravenous contrast. CONTRAST:  41m OMNIPAQUE IOHEXOL 300 MG/ML  SOLN COMPARISON:  Head CT without contrast 01/26/2019. Chest CT today reported separately. FINDINGS: CT HEAD FINDINGS Brain: Stable and normal cerebral volume. No midline shift, ventriculomegaly, mass effect, evidence of mass lesion, intracranial hemorrhage or evidence of cortically based acute infarction. Gray-white matter differentiation is within normal limits throughout the brain. No abnormal enhancement identified. Vascular: The major intracranial vascular structures are enhancing as expected. Skull: Stable, negative.  Sinuses/Orbits: Visualized paranasal sinuses and mastoids are stable and well pneumatized. Other: Visualized orbits and scalp soft tissues are within normal limits. CT NECK FINDINGS Pharynx and larynx: Laryngeal and pharyngeal soft tissue contours are within normal limits. Negative parapharyngeal and retropharyngeal spaces. Salivary glands: Negative sublingual space. Submandibular and parotid glands appear symmetric and normal. Thyroid: Negative. Lymph nodes: Negative.  No cervical lymphadenopathy. Vascular: Right IJ approach porta cath. The major vascular structures in the neck and at the skull base are patent. Mastoids and visualized paranasal sinuses: Clear. Skeleton: Absent dentition. Widespread advanced cervical disc and endplate degeneration. Upper cervical facet degeneration with mild degenerative appearing anterolisthesis of C3 on C4. Degenerative appearing bone mineralization heterogeneity in the odontoid. No acute or suspicious osseous lesion identified at the skull base or neck. Upper chest: Reported separately today. IMPRESSION: 1. No metastatic disease identified in the head or neck. 2.  Normal CT appearance of the brain. 3. Negative neck CT aside from widespread cervical spine degeneration. 4.  CT Chest today reported separately. Electronically Signed   By: HGenevie AnnM.D.   On: 05/18/2019 14:42   Ct Chest W Contrast  Result Date: 05/18/2019 CLINICAL DATA:  Hx of cervical cancer with mets to the lungs. Severe pain from base of skull to chest and left arm. Concern for further mets. EXAM: CT CHEST WITH CONTRAST TECHNIQUE: Multidetector CT imaging of the chest was performed during intravenous contrast administration. CONTRAST:  751mOMNIPAQUE IOHEXOL 300 MG/ML  SOLN COMPARISON:  04/21/2019 FINDINGS: Cardiovascular: Heart is normal in size and configuration. No pericardial effusion. Great vessels are normal in caliber. Minor aortic atherosclerosis. No dissection. Arch branch vessels are widely patent.  Mediastinum/Nodes: Visualized thyroid gland is unremarkable. No neck base or axillary masses or enlarged lymph nodes. No mediastinal or hilar masses or adenopathy. Trachea and esophagus are unremarkable. Right internal jugular Port-A-Cath tip projects in the right atrium. Lungs/Pleura: Numerous bilateral metastatic pulmonary nodules with an increase in number and size compared to the prior CT. Largest nodule lies at the base of the right lower lobe, image 37, series 9, 14 mm, previously 12 mm. Nodule in the posteromedial base of the left lower lobe, image 44, measures 10 mm, previously 7 mm. Multiple nodules shows central cavitation. No evidence of pneumonia or pulmonary edema. No pleural effusion or pneumothorax. Upper Abdomen: No acute abnormality. Musculoskeletal: No fracture or acute finding. No osteoblastic or osteolytic lesions. IMPRESSION: 1. No acute findings in the chest. 2. Progression of metastatic disease to the lungs with an increase in the size and  number of metastatic lung nodules. No other evidence of metastatic disease in the chest. Aortic Atherosclerosis (ICD10-I70.0). Electronically Signed   By: Lajean Manes M.D.   On: 05/18/2019 14:47   Mr Cervical Spine Wo Contrast  Result Date: 05/18/2019 CLINICAL DATA:  LEFT neck and shoulder pain. Pain is severe, for 3 days. Unable to lift LEFT arm. EXAM: MRI CERVICAL SPINE WITHOUT CONTRAST TECHNIQUE: Multiplanar, multisequence MR imaging of the cervical spine was performed. No intravenous contrast was administered. COMPARISON:  CT of the neck earlier today. FINDINGS: The patient was unable to remain motionless for the exam. Small or subtle lesions could be overlooked. Alignment: Straightening, and slight reversal of the normal cervical lordosis. Trace anterolisthesis C2-3, C3-4, and C4-5 are facet mediated. Vertebrae: Endplate reactive changes, C3 through C7. No visible metastatic disease. Cord: Limited assessment, no definite impingement or abnormal  signal. Posterior Fossa, vertebral arteries, paraspinal tissues: No tonsillar herniation. Vertebral flow voids are maintained. No neck masses. Disc levels: C2-3: Mild degenerative anterolisthesis. Annular bulge. No impingement. C3-4: Disc space narrowing. 2 mm degenerative anterolisthesis. Bulky RIGHT facet arthrosis. BILATERAL RIGHT greater than LEFT C4 foraminal narrowing. C4-5: Disc space narrowing with osseous spurring. 1-2 mm anterolisthesis. Bulky RIGHT facet arthropathy. RIGHT greater than LEFT C5 foraminal narrowing. C5-6: Disc space narrowing with osseous spurring. Mild stenosis. Shallow central protrusion. BILATERAL C6 foraminal narrowing due to uncinate spurring. C6-7: Disc space narrowing. Osseous spurring. Shallow central protrusion. Effacement anterior subarachnoid space. Borderline BILATERAL C7 foraminal narrowing. C7-T1: Unremarkable. IMPRESSION: Multilevel spondylosis as described. BILATERAL foraminal narrowing at C5-6 and C6-7 could affect the RIGHT or LEFT exiting nerve roots at those levels, but there is no clearly asymmetric or predominant LEFT-sided compressive lesion. Within limits for detection on this motion degraded exam, no metastatic disease, epidural hematoma, significant spinal stenosis, or dominant/lateralizing disc protrusion. Electronically Signed   By: Staci Righter M.D.   On: 05/18/2019 21:25   US Venous Img Upper Uni Left  Result Date: 05/18/2019 CLINICAL DATA:  Pain and swelling for 4 days. EXAM: LEFT UPPER EXTREMITY VENOUS DOPPLER ULTRASOUND TECHNIQUE: Gray-scale sonography with graded compression, as well as color Doppler and duplex ultrasound were performed to evaluate the upper extremity deep venous system from the level of the subclavian vein and including the jugular, axillary, basilic, radial, ulnar and upper cephalic vein. Spectral Doppler was utilized to evaluate flow at rest and with distal augmentation maneuvers. COMPARISON:  None. FINDINGS: Contralateral Subclavian  Vein: Respiratory phasicity is normal and symmetric with the symptomatic side. No evidence of thrombus. Normal compressibility. Internal Jugular Vein: No evidence of thrombus. Normal compressibility, respiratory phasicity and response to augmentation. Subclavian Vein: No evidence of thrombus. Normal compressibility, respiratory phasicity and response to augmentation. Axillary Vein: No evidence of thrombus. Normal compressibility, respiratory phasicity and response to augmentation. Cephalic Vein: No evidence of thrombus. Normal compressibility, respiratory phasicity and response to augmentation. Basilic Vein: No evidence of thrombus. Normal compressibility, respiratory phasicity and response to augmentation. Brachial Veins: No evidence of thrombus. Normal compressibility, respiratory phasicity and response to augmentation. Radial Veins: No evidence of thrombus. Normal compressibility, respiratory phasicity and response to augmentation. Ulnar Veins: No evidence of thrombus. Normal compressibility, respiratory phasicity and response to augmentation. Venous Reflux:  None visualized. Other Findings:  None visualized. IMPRESSION: No evidence of DVT within the left upper extremity. Electronically Signed   By: Lovey Newcomer M.D.   On: 05/18/2019 10:40      IMPRESSION AND PLAN:   1.  Uncontrolled pain secondary to metastatic  cancer - Patient case was discussed with Dr. Tasia Catchings by the ED physician recommending admission for pain control. -Troponin negative -Negative EKG -We will continue PRN IV pain management -Consult with oncology for further management suggestions  2.  Chest pain - Continue to trend troponin levels -Repeat EKG as needed with chest pain - Pain is being treated with analgesic -Not felt to be cardiac in nature  3.  COPD - PRN duo nebs -At baseline  4.  GERD - Continue pantoprazole  5.  Leiden factor V - Xarelto continued    All the records are reviewed and case discussed with ED  provider. The plan of care was discussed in details with the patient (and family). I answered all questions. The patient agreed to proceed with the above mentioned plan. Further management will depend upon hospital course.   CODE STATUS: Full code  TOTAL TIME TAKING CARE OF THIS PATIENT:27mnutes.    [email or web address] 05/18/2019 at 11:35 PM  Pager - [phone] After 6pm go to [email or web address] - [email or web address] [facility]  Office  [phone] CC: Primary care physician; [place]  Note: This dictation was prepared with Dragon dictation along with smaller phrase technology. Any transcriptional errors that result from this process are unintentional.

## 2019-05-18 NOTE — ED Notes (Signed)
Patient transported to MRI 

## 2019-05-18 NOTE — ED Notes (Signed)
Pt provided with phone for MRI screening

## 2019-05-18 NOTE — ED Notes (Signed)
Pt back from MRI 

## 2019-05-18 NOTE — ED Notes (Signed)
Pt alert and oriented x4

## 2019-05-18 NOTE — ED Notes (Signed)
This RN received a phone call from [facility], [facility] they will call back this RN when they are "ready".

## 2019-05-19 ENCOUNTER — Observation Stay: Payer: Medicaid Other

## 2019-05-19 ENCOUNTER — Other Ambulatory Visit: Payer: Self-pay

## 2019-05-19 DIAGNOSIS — Z9049 Acquired absence of other specified parts of digestive tract: Secondary | ICD-10-CM | POA: Diagnosis not present

## 2019-05-19 DIAGNOSIS — E2740 Unspecified adrenocortical insufficiency: Secondary | ICD-10-CM

## 2019-05-19 DIAGNOSIS — C7800 Secondary malignant neoplasm of unspecified lung: Secondary | ICD-10-CM | POA: Diagnosis not present

## 2019-05-19 DIAGNOSIS — C539 Malignant neoplasm of cervix uteri, unspecified: Secondary | ICD-10-CM

## 2019-05-19 DIAGNOSIS — K219 Gastro-esophageal reflux disease without esophagitis: Secondary | ICD-10-CM | POA: Diagnosis present

## 2019-05-19 DIAGNOSIS — I700 Atherosclerosis of aorta: Secondary | ICD-10-CM | POA: Diagnosis present

## 2019-05-19 DIAGNOSIS — Z20828 Contact with and (suspected) exposure to other viral communicable diseases: Secondary | ICD-10-CM | POA: Diagnosis present

## 2019-05-19 DIAGNOSIS — G893 Neoplasm related pain (acute) (chronic): Secondary | ICD-10-CM | POA: Diagnosis present

## 2019-05-19 DIAGNOSIS — R079 Chest pain, unspecified: Secondary | ICD-10-CM | POA: Diagnosis present

## 2019-05-19 DIAGNOSIS — Z8619 Personal history of other infectious and parasitic diseases: Secondary | ICD-10-CM | POA: Diagnosis not present

## 2019-05-19 DIAGNOSIS — M542 Cervicalgia: Secondary | ICD-10-CM | POA: Diagnosis present

## 2019-05-19 DIAGNOSIS — K529 Noninfective gastroenteritis and colitis, unspecified: Secondary | ICD-10-CM | POA: Diagnosis present

## 2019-05-19 DIAGNOSIS — C7802 Secondary malignant neoplasm of left lung: Secondary | ICD-10-CM | POA: Diagnosis present

## 2019-05-19 DIAGNOSIS — J449 Chronic obstructive pulmonary disease, unspecified: Secondary | ICD-10-CM | POA: Diagnosis present

## 2019-05-19 DIAGNOSIS — Z7901 Long term (current) use of anticoagulants: Secondary | ICD-10-CM | POA: Diagnosis not present

## 2019-05-19 DIAGNOSIS — M47812 Spondylosis without myelopathy or radiculopathy, cervical region: Secondary | ICD-10-CM | POA: Diagnosis present

## 2019-05-19 DIAGNOSIS — Z86718 Personal history of other venous thrombosis and embolism: Secondary | ICD-10-CM | POA: Diagnosis not present

## 2019-05-19 DIAGNOSIS — R52 Pain, unspecified: Secondary | ICD-10-CM | POA: Diagnosis present

## 2019-05-19 DIAGNOSIS — M4802 Spinal stenosis, cervical region: Secondary | ICD-10-CM | POA: Diagnosis present

## 2019-05-19 DIAGNOSIS — K591 Functional diarrhea: Secondary | ICD-10-CM

## 2019-05-19 DIAGNOSIS — Z9221 Personal history of antineoplastic chemotherapy: Secondary | ICD-10-CM | POA: Diagnosis not present

## 2019-05-19 DIAGNOSIS — C538 Malignant neoplasm of overlapping sites of cervix uteri: Secondary | ICD-10-CM | POA: Diagnosis present

## 2019-05-19 DIAGNOSIS — C7801 Secondary malignant neoplasm of right lung: Secondary | ICD-10-CM | POA: Diagnosis present

## 2019-05-19 DIAGNOSIS — D6851 Activated protein C resistance: Secondary | ICD-10-CM | POA: Diagnosis present

## 2019-05-19 DIAGNOSIS — Z87891 Personal history of nicotine dependence: Secondary | ICD-10-CM | POA: Diagnosis not present

## 2019-05-19 DIAGNOSIS — I10 Essential (primary) hypertension: Secondary | ICD-10-CM | POA: Diagnosis present

## 2019-05-19 DIAGNOSIS — E559 Vitamin D deficiency, unspecified: Secondary | ICD-10-CM | POA: Diagnosis present

## 2019-05-19 DIAGNOSIS — Z96652 Presence of left artificial knee joint: Secondary | ICD-10-CM | POA: Diagnosis present

## 2019-05-19 DIAGNOSIS — M25512 Pain in left shoulder: Secondary | ICD-10-CM | POA: Diagnosis present

## 2019-05-19 DIAGNOSIS — Z86711 Personal history of pulmonary embolism: Secondary | ICD-10-CM | POA: Diagnosis not present

## 2019-05-19 LAB — TROPONIN I (HIGH SENSITIVITY)
Troponin I (High Sensitivity): 7 ng/L (ref <18)
Troponin I (High Sensitivity): 7 ng/L (ref <18)

## 2019-05-19 LAB — BODY FLUID CELL COUNT WITH DIFFERENTIAL
Eos, Fluid: 0 %
Lymphs, Fluid: 0 %
Monocyte-Macrophage-Serous Fluid: 8 %
Neutrophil Count, Fluid: 92 %
Other Cells, Fluid: 0 %
Total Nucleated Cell Count, Fluid: 25499 cu mm

## 2019-05-19 LAB — SARS CORONAVIRUS 2 (TAT 6-24 HRS): SARS Coronavirus 2: NEGATIVE

## 2019-05-19 MED ORDER — ENSURE ENLIVE PO LIQD
237.0000 mL | Freq: Three times a day (TID) | ORAL | Status: DC
  Administered 2019-05-21: 237 mL via ORAL

## 2019-05-19 MED ORDER — OXYCODONE HCL ER 15 MG PO T12A
15.0000 mg | EXTENDED_RELEASE_TABLET | Freq: Two times a day (BID) | ORAL | Status: DC
  Administered 2019-05-19 – 2019-05-22 (×7): 15 mg via ORAL
  Filled 2019-05-19 (×7): qty 1

## 2019-05-19 MED ORDER — DIPHENOXYLATE-ATROPINE 2.5-0.025 MG PO TABS
1.0000 | ORAL_TABLET | Freq: Four times a day (QID) | ORAL | Status: DC | PRN
  Administered 2019-05-19 – 2019-05-21 (×3): 1 via ORAL
  Filled 2019-05-19 (×6): qty 1

## 2019-05-19 MED ORDER — CALCIUM CARBONATE-VITAMIN D 500-200 MG-UNIT PO TABS
2.0000 | ORAL_TABLET | Freq: Every day | ORAL | Status: DC
  Administered 2019-05-19 – 2019-05-22 (×3): 2 via ORAL
  Filled 2019-05-19 (×3): qty 2

## 2019-05-19 MED ORDER — HYOSCYAMINE SULFATE ER 0.375 MG PO TB12
0.3750 mg | ORAL_TABLET | Freq: Two times a day (BID) | ORAL | Status: DC
  Administered 2019-05-19 – 2019-05-22 (×6): 0.375 mg via ORAL
  Filled 2019-05-19 (×9): qty 1

## 2019-05-19 MED ORDER — AMITRIPTYLINE HCL 25 MG PO TABS
75.0000 mg | ORAL_TABLET | Freq: Every day | ORAL | Status: DC
  Administered 2019-05-19 – 2019-05-21 (×3): 75 mg via ORAL
  Filled 2019-05-19 (×3): qty 3

## 2019-05-19 MED ORDER — PANTOPRAZOLE SODIUM 40 MG PO TBEC
40.0000 mg | DELAYED_RELEASE_TABLET | Freq: Every day | ORAL | Status: DC
  Administered 2019-05-19 – 2019-05-22 (×3): 40 mg via ORAL
  Filled 2019-05-19 (×3): qty 1

## 2019-05-19 MED ORDER — HYDROMORPHONE HCL 1 MG/ML IJ SOLN
1.0000 mg | INTRAMUSCULAR | Status: DC | PRN
  Administered 2019-05-19 – 2019-05-20 (×7): 1 mg via INTRAVENOUS
  Filled 2019-05-19 (×7): qty 1

## 2019-05-19 MED ORDER — OXYCODONE HCL 5 MG PO TABS
10.0000 mg | ORAL_TABLET | Freq: Four times a day (QID) | ORAL | Status: DC | PRN
  Administered 2019-05-19: 04:00:00 10 mg via ORAL
  Filled 2019-05-19: qty 2

## 2019-05-19 MED ORDER — HYDROMORPHONE HCL 1 MG/ML IJ SOLN: 1.0000 mg | INTRAMUSCULAR | Status: DC | PRN

## 2019-05-19 MED ORDER — OXYCODONE HCL 5 MG PO TABS
10.0000 mg | ORAL_TABLET | Freq: Four times a day (QID) | ORAL | Status: DC | PRN
  Administered 2019-05-19 – 2019-05-22 (×6): 10 mg via ORAL
  Filled 2019-05-19 (×6): qty 2

## 2019-05-19 MED ORDER — ADULT MULTIVITAMIN W/MINERALS CH
1.0000 | ORAL_TABLET | Freq: Every day | ORAL | Status: DC
  Administered 2019-05-21 – 2019-05-22 (×2): 1 via ORAL
  Filled 2019-05-19 (×2): qty 1

## 2019-05-19 MED ORDER — ACETAMINOPHEN 325 MG PO TABS: 650.0000 mg | ORAL_TABLET | ORAL | Status: DC | PRN

## 2019-05-19 MED ORDER — FAMOTIDINE 20 MG PO TABS: 20.0000 mg | ORAL_TABLET | Freq: Two times a day (BID) | ORAL | Status: DC | PRN

## 2019-05-19 MED ORDER — ONDANSETRON 4 MG PO TBDP
4.0000 mg | ORAL_TABLET | Freq: Three times a day (TID) | ORAL | Status: DC | PRN
  Administered 2019-05-20 – 2019-05-21 (×2): 4 mg via ORAL
  Filled 2019-05-19 (×4): qty 1

## 2019-05-19 MED ORDER — CYCLOBENZAPRINE HCL 10 MG PO TABS
10.0000 mg | ORAL_TABLET | Freq: Three times a day (TID) | ORAL | Status: DC
  Administered 2019-05-19 – 2019-05-22 (×10): 10 mg via ORAL
  Filled 2019-05-19 (×10): qty 1

## 2019-05-19 MED ORDER — ELUXADOLINE 100 MG PO TABS: 100.0000 mg | ORAL_TABLET | Freq: Two times a day (BID) | ORAL | Status: DC

## 2019-05-19 MED ORDER — ALBUTEROL SULFATE (2.5 MG/3ML) 0.083% IN NEBU: 2.5000 mg | INHALATION_SOLUTION | RESPIRATORY_TRACT | Status: DC | PRN

## 2019-05-19 MED ORDER — ONDANSETRON HCL 4 MG/2ML IJ SOLN
4.0000 mg | Freq: Four times a day (QID) | INTRAMUSCULAR | Status: DC | PRN
  Administered 2019-05-19 – 2019-05-21 (×4): 4 mg via INTRAVENOUS
  Filled 2019-05-19 (×4): qty 2

## 2019-05-19 MED ORDER — POTASSIUM CHLORIDE CRYS ER 20 MEQ PO TBCR
20.0000 meq | EXTENDED_RELEASE_TABLET | Freq: Two times a day (BID) | ORAL | Status: DC
  Administered 2019-05-19 – 2019-05-22 (×6): 20 meq via ORAL
  Filled 2019-05-19 (×7): qty 1

## 2019-05-19 MED ORDER — RIVAROXABAN 20 MG PO TABS
20.0000 mg | ORAL_TABLET | Freq: Every day | ORAL | Status: DC
  Administered 2019-05-19 – 2019-05-21 (×3): 20 mg via ORAL
  Filled 2019-05-19 (×4): qty 1

## 2019-05-19 MED ORDER — HYDRALAZINE HCL 20 MG/ML IJ SOLN: 5.0000 mg | INTRAMUSCULAR | Status: DC | PRN

## 2019-05-19 NOTE — Progress Notes (Signed)
Sample from left shoulder joint hand delivered to lab.  Joanna Hall, BSN

## 2019-05-19 NOTE — ED Notes (Signed)
Pt informed that she will be going to the floor as soon as transport can be established.

## 2019-05-19 NOTE — Progress Notes (Addendum)
[place] [facility] [place] NAME: Joanna Hall    MR#:  [id number]  DATE OF BIRTH:  April 24, 1966  SUBJECTIVE:   Patient is still having pretty significant left sided neck pain, left-sided chest pain, and left shoulder pain.  She feels like her left shoulder is swollen.  Her pain is getting progressively worse.  No fevers chills.  REVIEW OF SYSTEMS:  Review of Systems  Constitutional: Negative for chills and fever.  HENT: Negative for congestion and sore throat.   Eyes: Negative for blurred vision and double vision.  Respiratory: Negative for cough and shortness of breath.   Cardiovascular: Positive for chest pain. Negative for palpitations.  Gastrointestinal: Negative for nausea and vomiting.  Genitourinary: Negative for dysuria and urgency.  Musculoskeletal: Positive for joint pain and neck pain. Negative for back pain.  Neurological: Negative for dizziness and headaches.  Psychiatric/Behavioral: Negative for depression. The patient is not nervous/anxious.     DRUG ALLERGIES:   Allergies  Allergen Reactions   Ketamine Anxiety and Other (See Comments)    Syncope episode/confusion    VITALS:  Blood pressure (!) 160/103, pulse (!) 107, temperature 98.7 F (37.1 C), temperature source Oral, resp. rate 15, height 5' 8"  (1.727 m), weight 71.1 kg, SpO2 98 %. PHYSICAL EXAMINATION:  Physical Exam  GENERAL:  Laying in the bed, tearful, intermittently crying.  Chronically ill-appearing. HEENT: Head atraumatic, normocephalic. Pupils equal, round, reactive to light and accommodation. No scleral icterus. Extraocular muscles intact. Oropharynx and nasopharynx clear.  NECK:  Supple, no jugular venous distention. No thyroid enlargement. LUNGS: Lungs are clear to auscultation bilaterally. No wheezes, crackles, rhonchi. No use of accessory muscles of respiration.  CARDIOVASCULAR: RRR, S1, S2 normal. No murmurs, rubs, or gallops.  ABDOMEN: Soft,  nontender, nondistended. Bowel sounds present.  EXTREMITIES: No pedal edema, cyanosis, or clubbing. + Left shoulder swelling.  No erythema.  Unable to assess range of motion due to pain. NEUROLOGIC: CN 2-12 intact, no focal deficits. No focal deficits. Sensation intact throughout. Gait not checked.  PSYCHIATRIC: The patient is alert and oriented x 3.  SKIN: No obvious rash, lesion, or ulcer.  LABORATORY PANEL:  Female CBC Recent Labs  Lab 05/18/19 0935  WBC 4.1  HGB 12.3  HCT 37.0  PLT 168   ------------------------------------------------------------------------------------------------------------------ Chemistries  Recent Labs  Lab 05/15/19 0822 05/18/19 0935  NA 138 136  K 3.6 3.6  CL 106 101  CO2 25 25  GLUCOSE 105* 101*  BUN 16 11  CREATININE 0.73 0.69  CALCIUM 9.2 9.0  MG 1.6*  --   AST 32 37  ALT 32 51*  ALKPHOS 139* 181*  BILITOT 0.5 0.7   RADIOLOGY:  Ct Head W Or Wo Contrast  Result Date: 05/18/2019 CLINICAL DATA:  53 year old female with history of metastatic cervical cancer. Severe pain radiating from the skull base through the neck and left arm. EXAM: CT HEAD WITHOUT AND WITH CONTRAST CT NECK WITH CONTRAST TECHNIQUE: Contiguous axial images were obtained from the base of the skull through the vertex without and with intravenous contrast. Multidetector CT imaging of the and neck was performed using the standard protocol following the bolus administration of intravenous contrast. CONTRAST:  22m OMNIPAQUE IOHEXOL 300 MG/ML  SOLN COMPARISON:  Head CT without contrast 01/26/2019. Chest CT today reported separately. FINDINGS: CT HEAD FINDINGS Brain: Stable and normal cerebral volume. No midline shift, ventriculomegaly, mass effect, evidence of mass lesion, intracranial hemorrhage or evidence of cortically based acute  infarction. Gray-white matter differentiation is within normal limits throughout the brain. No abnormal enhancement identified. Vascular: The major  intracranial vascular structures are enhancing as expected. Skull: Stable, negative. Sinuses/Orbits: Visualized paranasal sinuses and mastoids are stable and well pneumatized. Other: Visualized orbits and scalp soft tissues are within normal limits. CT NECK FINDINGS Pharynx and larynx: Laryngeal and pharyngeal soft tissue contours are within normal limits. Negative parapharyngeal and retropharyngeal spaces. Salivary glands: Negative sublingual space. Submandibular and parotid glands appear symmetric and normal. Thyroid: Negative. Lymph nodes: Negative.  No cervical lymphadenopathy. Vascular: Right IJ approach porta cath. The major vascular structures in the neck and at the skull base are patent. Mastoids and visualized paranasal sinuses: Clear. Skeleton: Absent dentition. Widespread advanced cervical disc and endplate degeneration. Upper cervical facet degeneration with mild degenerative appearing anterolisthesis of C3 on C4. Degenerative appearing bone mineralization heterogeneity in the odontoid. No acute or suspicious osseous lesion identified at the skull base or neck. Upper chest: Reported separately today. IMPRESSION: 1. No metastatic disease identified in the head or neck. 2.  Normal CT appearance of the brain. 3. Negative neck CT aside from widespread cervical spine degeneration. 4.  CT Chest today reported separately. Electronically Signed   By: Genevie Ann M.D.   On: 05/18/2019 14:42   Ct Soft Tissue Neck W Contrast  Result Date: 05/18/2019 CLINICAL DATA:  53 year old female with history of metastatic cervical cancer. Severe pain radiating from the skull base through the neck and left arm. EXAM: CT HEAD WITHOUT AND WITH CONTRAST CT NECK WITH CONTRAST TECHNIQUE: Contiguous axial images were obtained from the base of the skull through the vertex without and with intravenous contrast. Multidetector CT imaging of the and neck was performed using the standard protocol following the bolus administration of  intravenous contrast. CONTRAST:  58m OMNIPAQUE IOHEXOL 300 MG/ML  SOLN COMPARISON:  Head CT without contrast 01/26/2019. Chest CT today reported separately. FINDINGS: CT HEAD FINDINGS Brain: Stable and normal cerebral volume. No midline shift, ventriculomegaly, mass effect, evidence of mass lesion, intracranial hemorrhage or evidence of cortically based acute infarction. Gray-white matter differentiation is within normal limits throughout the brain. No abnormal enhancement identified. Vascular: The major intracranial vascular structures are enhancing as expected. Skull: Stable, negative. Sinuses/Orbits: Visualized paranasal sinuses and mastoids are stable and well pneumatized. Other: Visualized orbits and scalp soft tissues are within normal limits. CT NECK FINDINGS Pharynx and larynx: Laryngeal and pharyngeal soft tissue contours are within normal limits. Negative parapharyngeal and retropharyngeal spaces. Salivary glands: Negative sublingual space. Submandibular and parotid glands appear symmetric and normal. Thyroid: Negative. Lymph nodes: Negative.  No cervical lymphadenopathy. Vascular: Right IJ approach porta cath. The major vascular structures in the neck and at the skull base are patent. Mastoids and visualized paranasal sinuses: Clear. Skeleton: Absent dentition. Widespread advanced cervical disc and endplate degeneration. Upper cervical facet degeneration with mild degenerative appearing anterolisthesis of C3 on C4. Degenerative appearing bone mineralization heterogeneity in the odontoid. No acute or suspicious osseous lesion identified at the skull base or neck. Upper chest: Reported separately today. IMPRESSION: 1. No metastatic disease identified in the head or neck. 2.  Normal CT appearance of the brain. 3. Negative neck CT aside from widespread cervical spine degeneration. 4.  CT Chest today reported separately. Electronically Signed   By: HGenevie AnnM.D.   On: 05/18/2019 14:42   Ct Chest W  Contrast  Result Date: 05/18/2019 CLINICAL DATA:  Hx of cervical cancer with mets to the lungs. Severe pain from  base of skull to chest and left arm. Concern for further mets. EXAM: CT CHEST WITH CONTRAST TECHNIQUE: Multidetector CT imaging of the chest was performed during intravenous contrast administration. CONTRAST:  50m OMNIPAQUE IOHEXOL 300 MG/ML  SOLN COMPARISON:  04/21/2019 FINDINGS: Cardiovascular: Heart is normal in size and configuration. No pericardial effusion. Great vessels are normal in caliber. Minor aortic atherosclerosis. No dissection. Arch branch vessels are widely patent. Mediastinum/Nodes: Visualized thyroid gland is unremarkable. No neck base or axillary masses or enlarged lymph nodes. No mediastinal or hilar masses or adenopathy. Trachea and esophagus are unremarkable. Right internal jugular Port-A-Cath tip projects in the right atrium. Lungs/Pleura: Numerous bilateral metastatic pulmonary nodules with an increase in number and size compared to the prior CT. Largest nodule lies at the base of the right lower lobe, image 37, series 9, 14 mm, previously 12 mm. Nodule in the posteromedial base of the left lower lobe, image 44, measures 10 mm, previously 7 mm. Multiple nodules shows central cavitation. No evidence of pneumonia or pulmonary edema. No pleural effusion or pneumothorax. Upper Abdomen: No acute abnormality. Musculoskeletal: No fracture or acute finding. No osteoblastic or osteolytic lesions. IMPRESSION: 1. No acute findings in the chest. 2. Progression of metastatic disease to the lungs with an increase in the size and number of metastatic lung nodules. No other evidence of metastatic disease in the chest. Aortic Atherosclerosis (ICD10-I70.0). Electronically Signed   By: DLajean ManesM.D.   On: 05/18/2019 14:47   Mr Cervical Spine Wo Contrast  Result Date: 05/18/2019 CLINICAL DATA:  LEFT neck and shoulder pain. Pain is severe, for 3 days. Unable to lift LEFT arm. EXAM: MRI  CERVICAL SPINE WITHOUT CONTRAST TECHNIQUE: Multiplanar, multisequence MR imaging of the cervical spine was performed. No intravenous contrast was administered. COMPARISON:  CT of the neck earlier today. FINDINGS: The patient was unable to remain motionless for the exam. Small or subtle lesions could be overlooked. Alignment: Straightening, and slight reversal of the normal cervical lordosis. Trace anterolisthesis C2-3, C3-4, and C4-5 are facet mediated. Vertebrae: Endplate reactive changes, C3 through C7. No visible metastatic disease. Cord: Limited assessment, no definite impingement or abnormal signal. Posterior Fossa, vertebral arteries, paraspinal tissues: No tonsillar herniation. Vertebral flow voids are maintained. No neck masses. Disc levels: C2-3: Mild degenerative anterolisthesis. Annular bulge. No impingement. C3-4: Disc space narrowing. 2 mm degenerative anterolisthesis. Bulky RIGHT facet arthrosis. BILATERAL RIGHT greater than LEFT C4 foraminal narrowing. C4-5: Disc space narrowing with osseous spurring. 1-2 mm anterolisthesis. Bulky RIGHT facet arthropathy. RIGHT greater than LEFT C5 foraminal narrowing. C5-6: Disc space narrowing with osseous spurring. Mild stenosis. Shallow central protrusion. BILATERAL C6 foraminal narrowing due to uncinate spurring. C6-7: Disc space narrowing. Osseous spurring. Shallow central protrusion. Effacement anterior subarachnoid space. Borderline BILATERAL C7 foraminal narrowing. C7-T1: Unremarkable. IMPRESSION: Multilevel spondylosis as described. BILATERAL foraminal narrowing at C5-6 and C6-7 could affect the RIGHT or LEFT exiting nerve roots at those levels, but there is no clearly asymmetric or predominant LEFT-sided compressive lesion. Within limits for detection on this motion degraded exam, no metastatic disease, epidural hematoma, significant spinal stenosis, or dominant/lateralizing disc protrusion. Electronically Signed   By: JStaci RighterM.D.   On: 05/18/2019  21:25   Dg Shoulder Left  Result Date: 05/19/2019 CLINICAL DATA:  Left shoulder pain.  No known injury. EXAM: LEFT SHOULDER - 2+ VIEW COMPARISON:  Chest CT 05/18/2019 FINDINGS: No fracture or dislocation is identified. No obvious bone lesions. Small calcifications are noted near the acromion which could be  unfused ossification centers or insertional deltoid ligament calcific tendinopathy. Diffuse pulmonary metastatic disease is noted. IMPRESSION: No acute bony findings or obvious metastatic bone lesions. Numerous pulmonary metastatic lesions. Electronically Signed   By: Marijo Sanes M.D.   On: 05/19/2019 09:41   ASSESSMENT AND PLAN:   Intractable left shoulder pain/left sided neck pain- do not feel that this is cancer related pain. -MRI C-spine with multilevel spondylosis, bilateral foraminal narrowing at C5-6 and C6-7, but no clear compressive lesions or bony mets -Left shoulder x-ray unremarkable -Orthopedic surgery consult -Add flexeril for possible muscle spasm and IV Dilaudid for breakthrough pain  Chest pain- likely musculoskeletal in etiology.  PE unlikely, as patient is on Xarelto. -CT chest with progression of metastatic disease to the lungs -Troponin negative x 4, EKG without signs of ischemia -Pain control, as above  Metastatic cervical cancer-CT chest showed progression of metastatic disease.  Follows with Dr. Lynett Fish as an outpatient. -Oncology has been consulted  Hypertension-likely secondary to pain -IV hydralazine PRN  COPD- stable, no signs of acute exacerbation -Continue PRN duo nebs  GERD- stable -Continue Protonix  Factor V Leiden -Continue home xarelto  All the records are reviewed and case discussed with Care Management/Social Worker. Management plans discussed with the patient, family and they are in agreement.  CODE STATUS: Full Code  TOTAL TIME TAKING CARE OF THIS PATIENT: 45 minutes.   More than 50% of the time was spent in  counseling/coordination of care: YES  POSSIBLE D/C IN 1-2 DAYS, DEPENDING ON CLINICAL CONDITION.   Berna Spare Gagan Dillion M.D on 05/19/2019 at 12:32 PM  Between 7am to 6pm - Pager [phone]  After 6pm go to [email or web address] - Proofreader  [facility]  Office  [phone]  CC: Primary care physician; [place]  Note: This dictation was prepared with Dragon dictation along with smaller phrase technology. Any transcriptional errors that result from this process are unintentional.

## 2019-05-19 NOTE — Progress Notes (Signed)
Initial Nutrition Assessment  DOCUMENTATION CODES:   Not applicable  INTERVENTION:  Provide Ensure Enlive po TID, each supplement provides 350 kcal and 20 grams of protein. Patient prefers strawberry.  Provide daily MVI.  Recommend outpatient vitamin B12 injections since patient does not have a terminal ileum and will not be able to absorb po source of vitamin B12.  NUTRITION DIAGNOSIS:   Increased nutrient needs related to chronic illness(metastatic adenocarcinoma of cervix, possible malabsorption in setting of chronic diarrhea) as evidenced by estimated needs.  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   Pending results of COVID-19 test. Spoke with patient over the phone. She reports her appetite has been decreased for a while now related to her treatment and chronic diarrhea. She also reports difficulty with nausea. She is prescribed antiemetics for home and is also on Lomotil. She reports she is being referred for work-up for colostomy, as well. She attempts to eat but is not able to eat very much. She reports her last meal was two days ago. She is going to try to eat some dinner tonight. She is amenable to drinking strawberry Ensure starting tomorrow to help meet calorie/protein needs.  Patient reports her UBW was 249 lbs. Do not see a weight that high in the  chart. She was 167.4 lbs on 05/28/2018. She is currently 71.1 kg (156.75 lbs). She has lost 10.65 lbs (6.4% body weight) over the past year, which is not significant for time frame.  Medications reviewed and include: Oscal with D 2 tablets daily, pantoprazole, potassium chloride 20 mEq BID.  Labs reviewed.  Patient is at risk for malnutrition. Unable to determine if she meets criteria for malnutrition without completing NFPE.  NUTRITION - FOCUSED PHYSICAL EXAM:  Unable to complete at this time.  Diet Order:   Diet Order            Diet regular Room service appropriate? Yes; Fluid consistency: Thin  Diet effective now             EDUCATION NEEDS:   No education needs have been identified at this time  Skin:     Last BM:     Height:   Ht Readings from Last 1 Encounters:  05/19/19 5' 8"  (1.727 m)   Weight:   Wt Readings from Last 1 Encounters:  05/19/19 71.1 kg   Ideal Body Weight:     BMI:  Body mass index is 23.83 kg/m.  Estimated Nutritional Needs:   Kcal:  9233-0076  Protein:  90-105 grams  Fluid:  1.8-2 L/day  Willey Blade, MS, RD, LDN Office: [phone] Pager: [phone] After Hours/Weekend Pager: [phone]

## 2019-05-19 NOTE — ED Notes (Signed)
ED TO INPATIENT HANDOFF REPORT  ED Nurse Name and Phone #: Wells Guiles [phone]  S Name/Age/Gender Joanna Hall 53 y.o. female Room/Bed: ED16A/ED16A  Code Status   Code Status: Prior  Home/SNF/Other Home Patient oriented to: self, place, time and situation Is this baseline? Yes   Triage Complete: Triage complete  Chief Complaint pain left neck, shoulder cannot life  Triage Note Pt to ED via POV c/o pain in her left neck and shoulder. Pt states that she is unable to lift her arm due to pain. Pt appears to be uncomfortable at this time.  Pt states that the pain started on Thursday and has gotten worse. Pt is also having swelling in the left hand. Pt denies known injury   Allergies Allergies  Allergen Reactions  . Ketamine Anxiety and Other (See Comments)    Syncope episode/confusion     Level of Care/Admitting Diagnosis ED Disposition    ED Disposition Condition [facility] Area: [place] [[id number]]  Level of Care: Med-Surg [16]  Covid Evaluation: Asymptomatic Screening Protocol (No Symptoms)  Diagnosis: Chest pain in adult [[id number]]  Admitting Physician: Mayer Camel [[id number]]  Attending Physician: Mayer Camel [[id number]]  PT Class (Do Not Modify): Observation [104]  PT Acc Code (Do Not Modify): Observation [10022]       B Medical/Surgery History Past Medical History:  Diagnosis Date  . Abdominal pain 06/10/2018  . Abnormal cervical Papanicolaou smear 09/18/2017  . Anxiety   . Aortic atherosclerosis ([place])   . Arthritis    neck and knees  . 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 Blood transfusion without reported diagnosis   . Cervical cancer ([place]) 09/2016   mets lung  . Chronic anal fissure   . Chronic diarrhea   . Dyspnea   . Erosive gastropathy 09/18/2017  . Factor V Leiden mutation ([place])   . Fecal incontinence   . Genital warts   . GERD (gastroesophageal reflux disease)   . Heart murmur    . Hematochezia   . Hemorrhoids   . 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 Hepatitis, chronic ([place]) 05/05/2017  . History of cancer chemotherapy    completed 06/2017  . History of Clostridium difficile infection    while undergoing chemo.  Negative test 10/2017  . Ileocolic anastomotic leak   . 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 Intestinal infection due to Clostridium difficile 09/18/2017  . Macrocytic anemia with vitamin B12 deficiency   . Multiple gastric ulcers   . Nausea vomiting and diarrhea   . Pancolitis ([place]) 07/27/2018  . Perianal condylomata   . Pneumonia    History of  . Pulmonary nodules   . Rectal bleeding   . Small bowel obstruction ([place]) 08/2017  . Stiff neck    limited right turn  . Vitamin D deficiency    Past Surgical History:  Procedure Laterality Date  . CHOLECYSTECTOMY    . COLON SURGERY  08/2017   resection  . COLONOSCOPY WITH PROPOFOL N/A 12/20/2017   Procedure: COLONOSCOPY WITH PROPOFOL;  Surgeon: [name], MD;  Location: [facility] ENDOSCOPY;  Service: Gastroenterology;  Laterality: N/A;  . COLONOSCOPY WITH PROPOFOL N/A 07/30/2018   Procedure: COLONOSCOPY WITH PROPOFOL;  Surgeon: [name], MD;  Location: [facility] ENDOSCOPY;  Service: Gastroenterology;  Laterality: N/A;  . COLONOSCOPY WITH PROPOFOL N/A  10/10/2018   Procedure: COLONOSCOPY WITH PROPOFOL;  Surgeon: Lucilla Lame, MD;  Location: [facility] ENDOSCOPY;  Service: Endoscopy;  Laterality: N/A;  . DIAGNOSTIC LAPAROSCOPY    . ESOPHAGOGASTRODUODENOSCOPY (EGD) WITH PROPOFOL N/A 12/20/2017   Procedure: ESOPHAGOGASTRODUODENOSCOPY (EGD) WITH PROPOFOL;  Surgeon: [name], MD;  Location: [place];  Service: Gastroenterology;  Laterality: N/A;  . ESOPHAGOGASTRODUODENOSCOPY (EGD) WITH PROPOFOL  07/30/2018   Procedure: ESOPHAGOGASTRODUODENOSCOPY (EGD) WITH PROPOFOL;  Surgeon: [name], MD;  Location: [facility] ENDOSCOPY;  Service:  Gastroenterology;;  . Otho Darner SIGMOIDOSCOPY N/A 11/21/2018   Procedure: FLEXIBLE SIGMOIDOSCOPY;  Surgeon: [name], MD;  Location: [facility] ENDOSCOPY;  Service: Gastroenterology;  Laterality: N/A;  . LAPAROTOMY N/A 08/31/2017   Procedure: EXPLORATORY LAPAROTOMY for SBO, ileocolectomy, removal of piece of uterine wall;  Surgeon: [name], MD;  Location: [facility] ORS;  Service: General;  Laterality: N/A;  . LASER ABLATION CONDOLAMATA N/A 02/22/2018   Procedure: LASER ABLATION/REMOVAL OF [id number] AROUND ANUS AND VAGINA;  Surgeon: Michael Boston, MD;  Location: [place];  Service: General;  Laterality: N/A;  . OOPHORECTOMY    . PORTA CATH INSERTION N/A 05/13/2018   Procedure: PORTA CATH INSERTION;  Surgeon: Katha Cabal, MD;  Location: [place] CV LAB;  Service: Cardiovascular;  Laterality: N/A;  . SMALL INTESTINE SURGERY    . TANDEM RING INSERTION     x3  . THORACOTOMY    . TOTAL KNEE ARTHROPLASTY Left 04/24/2018   Procedure: TOTAL KNEE ARTHROPLASTY;  Surgeon: Lovell Sheehan, MD;  Location: [facility] ORS;  Service: Orthopedics;  Laterality: Left;     A IV Location/Drains/Wounds Patient Lines/Drains/Airways Status   Active Line/Drains/Airways    Name:   Placement date:   Placement time:   Site:   Days:   Implanted Port 01/22/19 Right Chest   01/22/19    1011    Chest   117          Intake/Output Last 24 hours No intake or output data in the 24 hours ending 05/19/19 0029  Labs/Imaging Results for orders placed or performed during the hospital encounter of 05/18/19 (from the past 48 hour(s))  CBC with Differential     Status: Abnormal   Collection Time: 05/18/19  9:35 AM  Result Value Ref Range   WBC 4.1 4.0 - 10.5 K/uL   RBC 3.61 (L) 3.87 - 5.11 MIL/uL   Hemoglobin 12.3 12.0 - 15.0 g/dL   HCT 37.0 36.0 - 46.0 %   MCV 102.5 (H) 80.0 - 100.0 fL   MCH 34.1 (H) 26.0 - 34.0 pg   MCHC 33.2 30.0 - 36.0 g/dL   RDW 13.4 11.5 - 15.5 %   Platelets 168 150 -  400 K/uL   nRBC 0.0 0.0 - 0.2 %   Neutrophils Relative % 75 %   Neutro Abs 3.0 1.7 - 7.7 K/uL   Lymphocytes Relative 16 %   Lymphs Abs 0.7 0.7 - 4.0 K/uL   Monocytes Relative 6 %   Monocytes Absolute 0.3 0.1 - 1.0 K/uL   Eosinophils Relative 2 %   Eosinophils Absolute 0.1 0.0 - 0.5 K/uL   Basophils Relative 0 %   Basophils Absolute 0.0 0.0 - 0.1 K/uL   Immature Granulocytes 1 %   Abs Immature Granulocytes 0.02 0.00 - 0.07 K/uL    Comment: Performed at [facility], [address], [place], [place] [address]  Comprehensive metabolic panel     Status: Abnormal   Collection Time: 05/18/19  9:35  AM  Result Value Ref Range   Sodium 136 135 - 145 mmol/L   Potassium 3.6 3.5 - 5.1 mmol/L   Chloride 101 98 - 111 mmol/L   CO2 25 22 - 32 mmol/L   Glucose, Bld 101 (H) 70 - 99 mg/dL   BUN 11 6 - 20 mg/dL   Creatinine, Ser 0.69 0.44 - 1.00 mg/dL   Calcium 9.0 8.9 - 10.3 mg/dL   Total Protein 7.7 6.5 - 8.1 g/dL   Albumin 3.8 3.5 - 5.0 g/dL   AST 37 15 - 41 U/L   ALT 51 (H) 0 - 44 U/L   Alkaline Phosphatase 181 (H) 38 - 126 U/L   Total Bilirubin 0.7 0.3 - 1.2 mg/dL   GFR calc non Af Amer >60 >60 mL/min   GFR calc Af Amer >60 >60 mL/min   Anion gap 10 5 - 15    Comment: Performed at [facility], [place], [place] [address]  Troponin I (High Sensitivity)     Status: None   Collection Time: 05/18/19  9:35 AM  Result Value Ref Range   Troponin I (High Sensitivity) 4 <18 ng/L    Comment: (NOTE) Elevated high sensitivity troponin I (hsTnI) values and significant  changes across serial measurements may suggest ACS but many other  chronic and acute conditions are known to elevate hsTnI results.  Refer to the "Links" section for chest pain algorithms and additional  guidance. Performed at [facility], [place], [place] [address]   Troponin I (High Sensitivity)     Status: None   Collection Time: 05/18/19 11:32 AM  Result Value  Ref Range   Troponin I (High Sensitivity) 6 <18 ng/L    Comment: (NOTE) Elevated high sensitivity troponin I (hsTnI) values and significant  changes across serial measurements may suggest ACS but many other  chronic and acute conditions are known to elevate hsTnI results.  Refer to the "Links" section for chest pain algorithms and additional  guidance. Performed at [facility], [address] [place], [place] [address]    *Note: Due to a large number of results and/or encounters for the requested time period, some results have not been displayed. A complete set of results can be found in Results Review.   Ct Head W Or Wo Contrast  Result Date: 05/18/2019 CLINICAL DATA:  53 year old female with history of metastatic cervical cancer. Severe pain radiating from the skull base through the neck and left arm. EXAM: CT HEAD WITHOUT AND WITH CONTRAST CT NECK WITH CONTRAST TECHNIQUE: Contiguous axial images were obtained from the base of the skull through the vertex without and with intravenous contrast. Multidetector CT imaging of the and neck was performed using the standard protocol following the bolus administration of intravenous contrast. CONTRAST:  29m OMNIPAQUE IOHEXOL 300 MG/ML  SOLN COMPARISON:  Head CT without contrast 01/26/2019. Chest CT today reported separately. FINDINGS: CT HEAD FINDINGS Brain: Stable and normal cerebral volume. No midline shift, ventriculomegaly, mass effect, evidence of mass lesion, intracranial hemorrhage or evidence of cortically based acute infarction. Gray-white matter differentiation is within normal limits throughout the brain. No abnormal enhancement identified. Vascular: The major intracranial vascular structures are enhancing as expected. Skull: Stable, negative. Sinuses/Orbits: Visualized paranasal sinuses and mastoids are stable and well pneumatized. Other: Visualized orbits and scalp soft tissues are within normal limits. CT NECK FINDINGS Pharynx and  larynx: Laryngeal and pharyngeal soft tissue contours are within normal limits. Negative parapharyngeal  and retropharyngeal spaces. Salivary glands: Negative sublingual space. Submandibular and parotid glands appear symmetric and normal. Thyroid: Negative. Lymph nodes: Negative.  No cervical lymphadenopathy. Vascular: Right IJ approach porta cath. The major vascular structures in the neck and at the skull base are patent. Mastoids and visualized paranasal sinuses: Clear. Skeleton: Absent dentition. Widespread advanced cervical disc and endplate degeneration. Upper cervical facet degeneration with mild degenerative appearing anterolisthesis of C3 on C4. Degenerative appearing bone mineralization heterogeneity in the odontoid. No acute or suspicious osseous lesion identified at the skull base or neck. Upper chest: Reported separately today. IMPRESSION: 1. No metastatic disease identified in the head or neck. 2.  Normal CT appearance of the brain. 3. Negative neck CT aside from widespread cervical spine degeneration. 4.  CT Chest today reported separately. Electronically Signed   By: Genevie Ann M.D.   On: 05/18/2019 14:42   Ct Soft Tissue Neck W Contrast  Result Date: 05/18/2019 CLINICAL DATA:  53 year old female with history of metastatic cervical cancer. Severe pain radiating from the skull base through the neck and left arm. EXAM: CT HEAD WITHOUT AND WITH CONTRAST CT NECK WITH CONTRAST TECHNIQUE: Contiguous axial images were obtained from the base of the skull through the vertex without and with intravenous contrast. Multidetector CT imaging of the and neck was performed using the standard protocol following the bolus administration of intravenous contrast. CONTRAST:  23m OMNIPAQUE IOHEXOL 300 MG/ML  SOLN COMPARISON:  Head CT without contrast 01/26/2019. Chest CT today reported separately. FINDINGS: CT HEAD FINDINGS Brain: Stable and normal cerebral volume. No midline shift, ventriculomegaly, mass effect, evidence  of mass lesion, intracranial hemorrhage or evidence of cortically based acute infarction. Gray-white matter differentiation is within normal limits throughout the brain. No abnormal enhancement identified. Vascular: The major intracranial vascular structures are enhancing as expected. Skull: Stable, negative. Sinuses/Orbits: Visualized paranasal sinuses and mastoids are stable and well pneumatized. Other: Visualized orbits and scalp soft tissues are within normal limits. CT NECK FINDINGS Pharynx and larynx: Laryngeal and pharyngeal soft tissue contours are within normal limits. Negative parapharyngeal and retropharyngeal spaces. Salivary glands: Negative sublingual space. Submandibular and parotid glands appear symmetric and normal. Thyroid: Negative. Lymph nodes: Negative.  No cervical lymphadenopathy. Vascular: Right IJ approach porta cath. The major vascular structures in the neck and at the skull base are patent. Mastoids and visualized paranasal sinuses: Clear. Skeleton: Absent dentition. Widespread advanced cervical disc and endplate degeneration. Upper cervical facet degeneration with mild degenerative appearing anterolisthesis of C3 on C4. Degenerative appearing bone mineralization heterogeneity in the odontoid. No acute or suspicious osseous lesion identified at the skull base or neck. Upper chest: Reported separately today. IMPRESSION: 1. No metastatic disease identified in the head or neck. 2.  Normal CT appearance of the brain. 3. Negative neck CT aside from widespread cervical spine degeneration. 4.  CT Chest today reported separately. Electronically Signed   By: HGenevie AnnM.D.   On: 05/18/2019 14:42   Ct Chest W Contrast  Result Date: 05/18/2019 CLINICAL DATA:  Hx of cervical cancer with mets to the lungs. Severe pain from base of skull to chest and left arm. Concern for further mets. EXAM: CT CHEST WITH CONTRAST TECHNIQUE: Multidetector CT imaging of the chest was performed during intravenous  contrast administration. CONTRAST:  751mOMNIPAQUE IOHEXOL 300 MG/ML  SOLN COMPARISON:  04/21/2019 FINDINGS: Cardiovascular: Heart is normal in size and configuration. No pericardial effusion. Great vessels are normal in caliber. Minor aortic atherosclerosis. No dissection. Arch branch vessels are  widely patent. Mediastinum/Nodes: Visualized thyroid gland is unremarkable. No neck base or axillary masses or enlarged lymph nodes. No mediastinal or hilar masses or adenopathy. Trachea and esophagus are unremarkable. Right internal jugular Port-A-Cath tip projects in the right atrium. Lungs/Pleura: Numerous bilateral metastatic pulmonary nodules with an increase in number and size compared to the prior CT. Largest nodule lies at the base of the right lower lobe, image 37, series 9, 14 mm, previously 12 mm. Nodule in the posteromedial base of the left lower lobe, image 44, measures 10 mm, previously 7 mm. Multiple nodules shows central cavitation. No evidence of pneumonia or pulmonary edema. No pleural effusion or pneumothorax. Upper Abdomen: No acute abnormality. Musculoskeletal: No fracture or acute finding. No osteoblastic or osteolytic lesions. IMPRESSION: 1. No acute findings in the chest. 2. Progression of metastatic disease to the lungs with an increase in the size and number of metastatic lung nodules. No other evidence of metastatic disease in the chest. Aortic Atherosclerosis (ICD10-I70.0). Electronically Signed   By: Lajean Manes M.D.   On: 05/18/2019 14:47   Mr Cervical Spine Wo Contrast  Result Date: 05/18/2019 CLINICAL DATA:  LEFT neck and shoulder pain. Pain is severe, for 3 days. Unable to lift LEFT arm. EXAM: MRI CERVICAL SPINE WITHOUT CONTRAST TECHNIQUE: Multiplanar, multisequence MR imaging of the cervical spine was performed. No intravenous contrast was administered. COMPARISON:  CT of the neck earlier today. FINDINGS: The patient was unable to remain motionless for the exam. Small or subtle  lesions could be overlooked. Alignment: Straightening, and slight reversal of the normal cervical lordosis. Trace anterolisthesis C2-3, C3-4, and C4-5 are facet mediated. Vertebrae: Endplate reactive changes, C3 through C7. No visible metastatic disease. Cord: Limited assessment, no definite impingement or abnormal signal. Posterior Fossa, vertebral arteries, paraspinal tissues: No tonsillar herniation. Vertebral flow voids are maintained. No neck masses. Disc levels: C2-3: Mild degenerative anterolisthesis. Annular bulge. No impingement. C3-4: Disc space narrowing. 2 mm degenerative anterolisthesis. Bulky RIGHT facet arthrosis. BILATERAL RIGHT greater than LEFT C4 foraminal narrowing. C4-5: Disc space narrowing with osseous spurring. 1-2 mm anterolisthesis. Bulky RIGHT facet arthropathy. RIGHT greater than LEFT C5 foraminal narrowing. C5-6: Disc space narrowing with osseous spurring. Mild stenosis. Shallow central protrusion. BILATERAL C6 foraminal narrowing due to uncinate spurring. C6-7: Disc space narrowing. Osseous spurring. Shallow central protrusion. Effacement anterior subarachnoid space. Borderline BILATERAL C7 foraminal narrowing. C7-T1: Unremarkable. IMPRESSION: Multilevel spondylosis as described. BILATERAL foraminal narrowing at C5-6 and C6-7 could affect the RIGHT or LEFT exiting nerve roots at those levels, but there is no clearly asymmetric or predominant LEFT-sided compressive lesion. Within limits for detection on this motion degraded exam, no metastatic disease, epidural hematoma, significant spinal stenosis, or dominant/lateralizing disc protrusion. Electronically Signed   By: Staci Righter M.D.   On: 05/18/2019 21:25   US Venous Img Upper Uni Left  Result Date: 05/18/2019 CLINICAL DATA:  Pain and swelling for 4 days. EXAM: LEFT UPPER EXTREMITY VENOUS DOPPLER ULTRASOUND TECHNIQUE: Gray-Hall sonography with graded compression, as well as color Doppler and duplex ultrasound were performed to  evaluate the upper extremity deep venous system from the level of the subclavian vein and including the jugular, axillary, basilic, radial, ulnar and upper cephalic vein. Spectral Doppler was utilized to evaluate flow at rest and with distal augmentation maneuvers. COMPARISON:  None. FINDINGS: Contralateral Subclavian Vein: Respiratory phasicity is normal and symmetric with the symptomatic side. No evidence of thrombus. Normal compressibility. Internal Jugular Vein: No evidence of thrombus. Normal compressibility, respiratory phasicity and  response to augmentation. Subclavian Vein: No evidence of thrombus. Normal compressibility, respiratory phasicity and response to augmentation. Axillary Vein: No evidence of thrombus. Normal compressibility, respiratory phasicity and response to augmentation. Cephalic Vein: No evidence of thrombus. Normal compressibility, respiratory phasicity and response to augmentation. Basilic Vein: No evidence of thrombus. Normal compressibility, respiratory phasicity and response to augmentation. Brachial Veins: No evidence of thrombus. Normal compressibility, respiratory phasicity and response to augmentation. Radial Veins: No evidence of thrombus. Normal compressibility, respiratory phasicity and response to augmentation. Ulnar Veins: No evidence of thrombus. Normal compressibility, respiratory phasicity and response to augmentation. Venous Reflux:  None visualized. Other Findings:  None visualized. IMPRESSION: No evidence of DVT within the left upper extremity. Electronically Signed   By: Lovey Newcomer M.D.   On: 05/18/2019 10:40    Pending Labs Unresulted Labs (From admission, onward)   None      Vitals/Pain Today's Vitals   05/18/19 1752 05/18/19 2023 05/18/19 2235 05/18/19 2318  BP:  (!) 167/111  (!) 157/108  Pulse:  84  85  Resp:  17  16  Temp:      SpO2:  97%  97%  PainSc: 8  10-Worst pain ever 10-Worst pain ever     Isolation Precautions No active  isolations  Medications Medications  LORazepam (ATIVAN) injection 1 mg (1 mg Intravenous Not Given 05/18/19 2316)  LORazepam (ATIVAN) injection 1 mg (1 mg Intravenous Not Given 05/18/19 2317)  morphine 4 MG/ML injection 4 mg (4 mg Intravenous Given 05/18/19 1335)  ondansetron (ZOFRAN) injection 4 mg (4 mg Intravenous Given 05/18/19 1334)  HYDROmorphone (DILAUDID) injection 0.5 mg (0.5 mg Intravenous Given 05/18/19 1548)  sodium chloride 0.9 % bolus 1,000 mL (0 mLs Intravenous Stopped 05/18/19 1555)  HYDROmorphone (DILAUDID) injection 0.5 mg (0.5 mg Intravenous Given 05/18/19 1436)  iohexol (OMNIPAQUE) 300 MG/ML solution 75 mL (75 mLs Intravenous Contrast Given 05/18/19 1401)  HYDROmorphone (DILAUDID) injection 0.5 mg (0.5 mg Intravenous Given 05/18/19 1753)  LORazepam (ATIVAN) injection 1 mg (1 mg Intravenous Given 05/18/19 1941)  HYDROmorphone (DILAUDID) injection 0.5 mg (0.5 mg Intravenous Given 05/18/19 2032)  HYDROmorphone (DILAUDID) injection 0.5 mg (0.5 mg Intravenous Given 05/18/19 2237)    Mobility walks Low fall risk   Focused Assessments Pain    R Recommendations: See Admitting Provider Note  Report given to:   Additional Notes:

## 2019-05-19 NOTE — Assessment & Plan Note (Addendum)
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  Status post cycle #1.  May 18, 2019 CT scan mild progression of the lung nodules noted however-too early to assess response from immunotherapy.    #Left shoulder swelling-status post aspiration-acute neutrophilic infiltrate noted.  Question infection versus others.  Defer to primary team orthopedics regarding antibiotics.  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   # adrenal insuffiency- on maintenance steroids- stable.   #Discussed with Dr. Brett Albino.

## 2019-05-19 NOTE — Consult Note (Signed)
ORTHOPAEDIC CONSULTATION  REQUESTING PHYSICIAN: Mayo, Pete Pelt, MD  Chief Complaint: Left shoulder pain  HPI: Joanna Hall is a 53 y.o. female who complains of left shoulder pain for 4 days.  She does not recall any injury.  She is being treated for metastatic cervical cancer.  She saw my partner Dr. [name] last week and had a right shoulder injection.  She feels that the left shoulder is swollen.  She had some neck pain as well.  She denies fever or chills.  She remains afebrile.  White count is on the low side.  Past Medical History:  Diagnosis Date  . Abdominal pain 06/10/2018  . Abnormal cervical Papanicolaou smear 09/18/2017  . Anxiety   . Aortic atherosclerosis ([place])   . Arthritis    neck and knees  . 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 Blood transfusion without reported diagnosis   . Cervical cancer ([place]) 09/2016   mets lung  . Chronic anal fissure   . Chronic diarrhea   . Dyspnea   . Erosive gastropathy 09/18/2017  . Factor V Leiden mutation ([place])   . Fecal incontinence   . Genital warts   . GERD (gastroesophageal reflux disease)   . Heart murmur   . Hematochezia   . Hemorrhoids   . 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 Hepatitis, chronic ([place]) 05/05/2017  . History of cancer chemotherapy    completed 06/2017  . History of Clostridium difficile infection    while undergoing chemo.  Negative test 10/2017  . Ileocolic anastomotic leak   . 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 Intestinal infection due to Clostridium difficile 09/18/2017  . Macrocytic anemia with vitamin B12 deficiency   . Multiple gastric ulcers   . Nausea vomiting and diarrhea   . Pancolitis ([place]) 07/27/2018  . Perianal condylomata   . Pneumonia    History of  . Pulmonary nodules   . Rectal bleeding   . Small bowel obstruction ([place]) 08/2017  . Stiff neck    limited right turn  . Vitamin D deficiency    Past Surgical  History:  Procedure Laterality Date  . CHOLECYSTECTOMY    . COLON SURGERY  08/2017   resection  . COLONOSCOPY WITH PROPOFOL N/A 12/20/2017   Procedure: COLONOSCOPY WITH PROPOFOL;  Surgeon: [name], MD;  Location: [facility] ENDOSCOPY;  Service: Gastroenterology;  Laterality: N/A;  . COLONOSCOPY WITH PROPOFOL N/A 07/30/2018   Procedure: COLONOSCOPY WITH PROPOFOL;  Surgeon: [name], MD;  Location: [facility] ENDOSCOPY;  Service: Gastroenterology;  Laterality: N/A;  . COLONOSCOPY WITH PROPOFOL N/A 10/10/2018   Procedure: COLONOSCOPY WITH PROPOFOL;  Surgeon: Lucilla Lame, MD;  Location: [facility] ENDOSCOPY;  Service: Endoscopy;  Laterality: N/A;  . DIAGNOSTIC LAPAROSCOPY    . ESOPHAGOGASTRODUODENOSCOPY (EGD) WITH PROPOFOL N/A 12/20/2017   Procedure: ESOPHAGOGASTRODUODENOSCOPY (EGD) WITH PROPOFOL;  Surgeon: [name], MD;  Location: [facility];  Service: Gastroenterology;  Laterality: N/A;  . ESOPHAGOGASTRODUODENOSCOPY (EGD) WITH PROPOFOL  07/30/2018   Procedure: ESOPHAGOGASTRODUODENOSCOPY (EGD) WITH PROPOFOL;  Surgeon: [name], MD;  Location: [facility] ENDOSCOPY;  Service: Gastroenterology;;  . Otho Darner SIGMOIDOSCOPY N/A 11/21/2018   Procedure: FLEXIBLE SIGMOIDOSCOPY;  Surgeon: [name], MD;  Location: [facility] ENDOSCOPY;  Service: Gastroenterology;  Laterality: N/A;  . LAPAROTOMY N/A 08/31/2017   Procedure: EXPLORATORY LAPAROTOMY for SBO, ileocolectomy, removal of piece of uterine wall;  Surgeon: [name], MD;  Location: [facility] ORS;  Service: General;  Laterality: N/A;  . LASER ABLATION CONDOLAMATA N/A 02/22/2018   Procedure: LASER ABLATION/REMOVAL OF JJOACZYSAYT AROUND ANUS AND VAGINA;  Surgeon: Michael Boston, MD;  Location: [facility];  Service: General;  Laterality: N/A;  . OOPHORECTOMY    . PORTA CATH INSERTION N/A 05/13/2018   Procedure: PORTA CATH INSERTION;  Surgeon: Katha Cabal, MD;  Location: [facility] CV LAB;  Service:  Cardiovascular;  Laterality: N/A;  . SMALL INTESTINE SURGERY    . TANDEM RING INSERTION     x3  . THORACOTOMY    . TOTAL KNEE ARTHROPLASTY Left 04/24/2018   Procedure: TOTAL KNEE ARTHROPLASTY;  Surgeon: Lovell Sheehan, MD;  Location: [facility] ORS;  Service: Orthopedics;  Laterality: Left;   Social History   Socioeconomic History  . Marital status: Divorced    Spouse name: Not on file  . Number of children: Not on file  . Years of education: Not on file  . Highest education level: Not on file  Occupational History  . Not on file  Social Needs  . Financial resource strain: Not hard at all  . Food insecurity    Worry: Never true    Inability: Never true  . Transportation needs    Medical: No    Non-medical: No  Tobacco Use  . Smoking status: Former Smoker    Quit date: 10/16/2006    Years since quitting: 12.5  . Smokeless tobacco: Never Used  Substance and Sexual Activity  . Alcohol use: Not Currently    Frequency: Never    Comment: seldom  . Drug use: Yes    Types: Marijuana    Comment: not very often  . Sexual activity: Not Currently    Birth control/protection: Post-menopausal    Comment: Not Asked  Lifestyle  . Physical activity    Days per week: Patient refused    Minutes per session: Patient refused  . Stress: Only a little  Relationships  . Social Herbalist on phone: Patient refused    Gets together: Patient refused    Attends religious service: Patient refused    Active member of club or organization: Patient refused    Attends meetings of clubs or organizations: Patient refused    Relationship status: Patient refused  Other Topics Concern  . Not on file  Social History Narrative  . Not on file   Family History  Problem Relation Age of Onset  . Hypertension Father   . Diabetes Father   . Alcohol abuse Daughter   . Hypertension Maternal Grandmother   . Diabetes Maternal Grandmother   . Diabetes Paternal Grandmother   . Hypertension Paternal  Grandmother    Allergies  Allergen Reactions  . Ketamine Anxiety and Other (See Comments)    Syncope episode/confusion    Prior to Admission medications   Medication Sig Start Date End Date Taking? Authorizing Provider  amitriptyline (ELAVIL) 75 MG tablet Take 1 tablet (75 mg total) by mouth at bedtime. 05/06/19 08/04/19 Yes Vanga, Tally Due, MD  Calcium Carb-Cholecalciferol (CALCIUM 500 +D) 500-400 MG-UNIT TABS Take 2 tablets by mouth daily.   Yes [provider]  Eluxadoline (VIBERZI) 100 MG TABS Take 1 tablet (100 mg total) by mouth 2 (two) times daily. 04/21/19 05/21/19 Yes Vanga, Tally Due, MD  famotidine (PEPCID) 20 MG tablet Take 1 tablet (20 mg total) by mouth 2 (two) times daily as needed for heartburn or  indigestion. 11/25/18  Yes Gouru, Illene Silver, MD  hydrOXYzine (ATARAX/VISTARIL) 10 MG tablet Take 1 tablet (10 mg total) by mouth 3 (three) times daily as needed. 05/08/19  Yes Cammie Sickle, MD  hyoscyamine (LEVBID) 0.375 MG 12 hr tablet Take 1 tablet (0.375 mg total)by mouth 2 (two) times daily. 01/03/19  Yes Vanga, Tally Due, MD  Multiple Vitamins-Minerals (MULTIVITAMIN WITH MINERALS) tablet Take 1 tablet by mouth daily. 08/01/18 08/01/19 Yes Wieting, Richard, MD  ondansetron (ZOFRAN ODT) 4 MG disintegrating tablet Take 1 tablet (4 mg total) by mouth every 8 (eight) hours as needed for nausea or vomiting. 03/20/19  Yes Verlon Au, NP  oxyCODONE (OXYCONTIN) 15 mg 12 hr tablet Take 1 tablet (15 mg total) by mouth every 12 (twelve) hours. 04/25/19  Yes Verlon Au, NP  Oxycodone HCl 10 MG TABS Take 1 tablet (10 mg total) by mouth every 6 (six) hours as needed (for breakthrough pain). 05/09/19  Yes Verlon Au, NP  pantoprazole (PROTONIX) 40 MG tablet Take 1 tablet (40 mg total) by mouth daily. 03/18/19  Yes Cammie Sickle, MD  potassium chloride SA (K-DUR,KLOR-CON) 20 MEQ tablet Take 1 tablet (20 mEq total) by mouth daily. Patient taking differently: Take 20  mEq by mouth 2 (two) times daily.  01/01/19  Yes Cammie Sickle, MD  rivaroxaban (XARELTO) 20 MG TABS tablet Take 1 tablet (20 mg total) by mouth daily with supper. Patient taking differently: Take 20 mg by mouth daily with supper. Takes in the morning 05/05/19  Yes Cammie Sickle, MD  diphenoxylate-atropine (LOMOTIL) 2.5-0.025 MG tablet TAKE (2) TABLETS FOUR TIMES DAILY. 04/21/19   [name], MD  meclizine (ANTIVERT) 25 MG tablet Take 1 tablet (25 mg total) by mouth 2 (two) times daily as needed for dizziness. 01/26/19   Merlyn Lot, MD  promethazine (PHENERGAN) 25 MG tablet Take 1 tablet (25 mg total) by mouth every 8 (eight) hours as needed for nausea or vomiting. 03/05/19   Earlie Server, MD  VENTOLIN HFA 108 (90 Base) MCG/ACT inhaler Inhale 1-2 puffs into the lungs every 4 (four) hours as needed for shortness of breath. 02/19/19   Verlon Au, NP   Ct Head W Or Wo Contrast  Result Date: 05/18/2019 CLINICAL DATA:  53 year old female with history of metastatic cervical cancer. Severe pain radiating from the skull base through the neck and left arm. EXAM: CT HEAD WITHOUT AND WITH CONTRAST CT NECK WITH CONTRAST TECHNIQUE: Contiguous axial images were obtained from the base of the skull through the vertex without and with intravenous contrast. Multidetector CT imaging of the and neck was performed using the standard protocol following the bolus administration of intravenous contrast. CONTRAST:  83m OMNIPAQUE IOHEXOL 300 MG/ML  SOLN COMPARISON:  Head CT without contrast 01/26/2019. Chest CT today reported separately. FINDINGS: CT HEAD FINDINGS Brain: Stable and normal cerebral volume. No midline shift, ventriculomegaly, mass effect, evidence of mass lesion, intracranial hemorrhage or evidence of cortically based acute infarction. Gray-white matter differentiation is within normal limits throughout the brain. No abnormal enhancement identified. Vascular: The major intracranial vascular  structures are enhancing as expected. Skull: Stable, negative. Sinuses/Orbits: Visualized paranasal sinuses and mastoids are stable and well pneumatized. Other: Visualized orbits and scalp soft tissues are within normal limits. CT NECK FINDINGS Pharynx and larynx: Laryngeal and pharyngeal soft tissue contours are within normal limits. Negative parapharyngeal and retropharyngeal spaces. Salivary glands: Negative sublingual space. Submandibular and parotid glands appear symmetric and normal. Thyroid: Negative.  Lymph nodes: Negative.  No cervical lymphadenopathy. Vascular: Right IJ approach porta cath. The major vascular structures in the neck and at the skull base are patent. Mastoids and visualized paranasal sinuses: Clear. Skeleton: Absent dentition. Widespread advanced cervical disc and endplate degeneration. Upper cervical facet degeneration with mild degenerative appearing anterolisthesis of C3 on C4. Degenerative appearing bone mineralization heterogeneity in the odontoid. No acute or suspicious osseous lesion identified at the skull base or neck. Upper chest: Reported separately today. IMPRESSION: 1. No metastatic disease identified in the head or neck. 2.  Normal CT appearance of the brain. 3. Negative neck CT aside from widespread cervical spine degeneration. 4.  CT Chest today reported separately. Electronically Signed   By: Genevie Ann M.D.   On: 05/18/2019 14:42   Ct Soft Tissue Neck W Contrast  Result Date: 05/18/2019 CLINICAL DATA:  53 year old female with history of metastatic cervical cancer. Severe pain radiating from the skull base through the neck and left arm. EXAM: CT HEAD WITHOUT AND WITH CONTRAST CT NECK WITH CONTRAST TECHNIQUE: Contiguous axial images were obtained from the base of the skull through the vertex without and with intravenous contrast. Multidetector CT imaging of the and neck was performed using the standard protocol following the bolus administration of intravenous contrast.  CONTRAST:  73m OMNIPAQUE IOHEXOL 300 MG/ML  SOLN COMPARISON:  Head CT without contrast 01/26/2019. Chest CT today reported separately. FINDINGS: CT HEAD FINDINGS Brain: Stable and normal cerebral volume. No midline shift, ventriculomegaly, mass effect, evidence of mass lesion, intracranial hemorrhage or evidence of cortically based acute infarction. Gray-white matter differentiation is within normal limits throughout the brain. No abnormal enhancement identified. Vascular: The major intracranial vascular structures are enhancing as expected. Skull: Stable, negative. Sinuses/Orbits: Visualized paranasal sinuses and mastoids are stable and well pneumatized. Other: Visualized orbits and scalp soft tissues are within normal limits. CT NECK FINDINGS Pharynx and larynx: Laryngeal and pharyngeal soft tissue contours are within normal limits. Negative parapharyngeal and retropharyngeal spaces. Salivary glands: Negative sublingual space. Submandibular and parotid glands appear symmetric and normal. Thyroid: Negative. Lymph nodes: Negative.  No cervical lymphadenopathy. Vascular: Right IJ approach porta cath. The major vascular structures in the neck and at the skull base are patent. Mastoids and visualized paranasal sinuses: Clear. Skeleton: Absent dentition. Widespread advanced cervical disc and endplate degeneration. Upper cervical facet degeneration with mild degenerative appearing anterolisthesis of C3 on C4. Degenerative appearing bone mineralization heterogeneity in the odontoid. No acute or suspicious osseous lesion identified at the skull base or neck. Upper chest: Reported separately today. IMPRESSION: 1. No metastatic disease identified in the head or neck. 2.  Normal CT appearance of the brain. 3. Negative neck CT aside from widespread cervical spine degeneration. 4.  CT Chest today reported separately. Electronically Signed   By: HGenevie AnnM.D.   On: 05/18/2019 14:42   Ct Chest W Contrast  Result Date:  05/18/2019 CLINICAL DATA:  Hx of cervical cancer with mets to the lungs. Severe pain from base of skull to chest and left arm. Concern for further mets. EXAM: CT CHEST WITH CONTRAST TECHNIQUE: Multidetector CT imaging of the chest was performed during intravenous contrast administration. CONTRAST:  714mOMNIPAQUE IOHEXOL 300 MG/ML  SOLN COMPARISON:  04/21/2019 FINDINGS: Cardiovascular: Heart is normal in size and configuration. No pericardial effusion. Great vessels are normal in caliber. Minor aortic atherosclerosis. No dissection. Arch branch vessels are widely patent. Mediastinum/Nodes: Visualized thyroid gland is unremarkable. No neck base or axillary masses or enlarged lymph nodes.  No mediastinal or hilar masses or adenopathy. Trachea and esophagus are unremarkable. Right internal jugular Port-A-Cath tip projects in the right atrium. Lungs/Pleura: Numerous bilateral metastatic pulmonary nodules with an increase in number and size compared to the prior CT. Largest nodule lies at the base of the right lower lobe, image 37, series 9, 14 mm, previously 12 mm. Nodule in the posteromedial base of the left lower lobe, image 44, measures 10 mm, previously 7 mm. Multiple nodules shows central cavitation. No evidence of pneumonia or pulmonary edema. No pleural effusion or pneumothorax. Upper Abdomen: No acute abnormality. Musculoskeletal: No fracture or acute finding. No osteoblastic or osteolytic lesions. IMPRESSION: 1. No acute findings in the chest. 2. Progression of metastatic disease to the lungs with an increase in the size and number of metastatic lung nodules. No other evidence of metastatic disease in the chest. Aortic Atherosclerosis (ICD10-I70.0). Electronically Signed   By: Lajean Manes M.D.   On: 05/18/2019 14:47   Mr Cervical Spine Wo Contrast  Result Date: 05/18/2019 CLINICAL DATA:  LEFT neck and shoulder pain. Pain is severe, for 3 days. Unable to lift LEFT arm. EXAM: MRI CERVICAL SPINE WITHOUT  CONTRAST TECHNIQUE: Multiplanar, multisequence MR imaging of the cervical spine was performed. No intravenous contrast was administered. COMPARISON:  CT of the neck earlier today. FINDINGS: The patient was unable to remain motionless for the exam. Small or subtle lesions could be overlooked. Alignment: Straightening, and slight reversal of the normal cervical lordosis. Trace anterolisthesis C2-3, C3-4, and C4-5 are facet mediated. Vertebrae: Endplate reactive changes, C3 through C7. No visible metastatic disease. Cord: Limited assessment, no definite impingement or abnormal signal. Posterior Fossa, vertebral arteries, paraspinal tissues: No tonsillar herniation. Vertebral flow voids are maintained. No neck masses. Disc levels: C2-3: Mild degenerative anterolisthesis. Annular bulge. No impingement. C3-4: Disc space narrowing. 2 mm degenerative anterolisthesis. Bulky RIGHT facet arthrosis. BILATERAL RIGHT greater than LEFT C4 foraminal narrowing. C4-5: Disc space narrowing with osseous spurring. 1-2 mm anterolisthesis. Bulky RIGHT facet arthropathy. RIGHT greater than LEFT C5 foraminal narrowing. C5-6: Disc space narrowing with osseous spurring. Mild stenosis. Shallow central protrusion. BILATERAL C6 foraminal narrowing due to uncinate spurring. C6-7: Disc space narrowing. Osseous spurring. Shallow central protrusion. Effacement anterior subarachnoid space. Borderline BILATERAL C7 foraminal narrowing. C7-T1: Unremarkable. IMPRESSION: Multilevel spondylosis as described. BILATERAL foraminal narrowing at C5-6 and C6-7 could affect the RIGHT or LEFT exiting nerve roots at those levels, but there is no clearly asymmetric or predominant LEFT-sided compressive lesion. Within limits for detection on this motion degraded exam, no metastatic disease, epidural hematoma, significant spinal stenosis, or dominant/lateralizing disc protrusion. Electronically Signed   By: Staci Righter M.D.   On: 05/18/2019 21:25   US Venous Img  Upper Uni Left  Result Date: 05/18/2019 CLINICAL DATA:  Pain and swelling for 4 days. EXAM: LEFT UPPER EXTREMITY VENOUS DOPPLER ULTRASOUND TECHNIQUE: Gray-scale sonography with graded compression, as well as color Doppler and duplex ultrasound were performed to evaluate the upper extremity deep venous system from the level of the subclavian vein and including the jugular, axillary, basilic, radial, ulnar and upper cephalic vein. Spectral Doppler was utilized to evaluate flow at rest and with distal augmentation maneuvers. COMPARISON:  None. FINDINGS: Contralateral Subclavian Vein: Respiratory phasicity is normal and symmetric with the symptomatic side. No evidence of thrombus. Normal compressibility. Internal Jugular Vein: No evidence of thrombus. Normal compressibility, respiratory phasicity and response to augmentation. Subclavian Vein: No evidence of thrombus. Normal compressibility, respiratory phasicity and response to augmentation. Axillary  Vein: No evidence of thrombus. Normal compressibility, respiratory phasicity and response to augmentation. Cephalic Vein: No evidence of thrombus. Normal compressibility, respiratory phasicity and response to augmentation. Basilic Vein: No evidence of thrombus. Normal compressibility, respiratory phasicity and response to augmentation. Brachial Veins: No evidence of thrombus. Normal compressibility, respiratory phasicity and response to augmentation. Radial Veins: No evidence of thrombus. Normal compressibility, respiratory phasicity and response to augmentation. Ulnar Veins: No evidence of thrombus. Normal compressibility, respiratory phasicity and response to augmentation. Venous Reflux:  None visualized. Other Findings:  None visualized. IMPRESSION: No evidence of DVT within the left upper extremity. Electronically Signed   By: Lovey Newcomer M.D.   On: 05/18/2019 10:40   Dg Shoulder Left  Result Date: 05/19/2019 CLINICAL DATA:  Left shoulder pain.  No known injury.  EXAM: LEFT SHOULDER - 2+ VIEW COMPARISON:  Chest CT 05/18/2019 FINDINGS: No fracture or dislocation is identified. No obvious bone lesions. Small calcifications are noted near the acromion which could be unfused ossification centers or insertional deltoid ligament calcific tendinopathy. Diffuse pulmonary metastatic disease is noted. IMPRESSION: No acute bony findings or obvious metastatic bone lesions. Numerous pulmonary metastatic lesions. Electronically Signed   By: Marijo Sanes M.D.   On: 05/19/2019 09:41    Positive ROS: All other systems have been reviewed and were otherwise negative with the exception of those mentioned in the HPI and as above.  Physical Exam: General: Alert, no acute distress Cardiovascular: No pedal edema Respiratory: No cyanosis, no use of accessory musculature GI: No organomegaly, abdomen is soft and non-tender Skin: No lesions in the area of chief complaint Neurologic: Sensation intact distally Psychiatric: Patient is competent for consent with normal mood and affect Lymphatic: No axillary or cervical lymphadenopathy  MUSCULOSKELETAL: Chronically ill appearing female in good spirits however.  She is cooperative.  Head and neck has mild pain to range of motion.  The left shoulder is swollen.  There is no redness or increased warmth.  She has pain with range of motion.  Neurovascular status is intact distally.  Assessment: Left shoulder pain and swelling.  Possible calcific tendinitis/bursitis. Plan:   We will aspirate and culture this later today. Recommend ice packs to the shoulder.  And sling. Prednisone taper if possible and muscle relaxer.     Park Breed, MD [phone]   05/19/2019 11:43 AM

## 2019-05-19 NOTE — Consult Note (Signed)
[place] NOTE  Patient Care Team: [place] as PCP - General (Family Medicine) Mellody Drown, MD as Consulting Physician (Obstetrics and Gynecology) Lenor Coffin, MD as Attending Physician (Gynecology) Lequita Asal, MD as Medical Oncologist (Hematology and Oncology) [name], MD as Consulting Physician (Gastroenterology) Michael Boston, MD as Consulting Physician (General Surgery) Lovell Sheehan, MD as Consulting Physician (Orthopedic Surgery) Clent Jacks, RN as Registered Nurse  CHIEF COMPLAINTS/PURPOSE OF CONSULTATION:  Cervical cancer/left neck pain  HISTORY OF PRESENTING ILLNESS:  Joanna Hall 53 y.o.  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  Patient states that she noted to have worsening pain of the left neck/left shoulder for the last 2 to 3 days.  Progressive getting worse difficulty with movement.  Denies any trauma.  No falls.  Aggravated by treatment.  Patient had extensive imaging of the neck MRI negative for any metastatic malignancy; positive for arthritic changes.  CT chest-showed mild progression of the lung lesions.  CT scan brain noncontrast negative for any acute process.  Patient is currently in distress because of the pain.  She is unable to move her left upper extremity.   Review of Systems  Constitutional: Positive for malaise/fatigue. Negative for chills, diaphoresis, fever and weight loss.  HENT: Negative for nosebleeds and sore throat.   Eyes: Negative for double vision.  Respiratory: Positive for shortness of breath. Negative for cough, hemoptysis, sputum production and wheezing.   Cardiovascular: Negative for chest pain, palpitations, orthopnea and leg swelling.  Gastrointestinal: Positive for abdominal pain and diarrhea.  Negative for blood in stool, constipation, heartburn, melena, nausea and vomiting.  Genitourinary: Negative for dysuria, frequency and urgency.  Musculoskeletal: Positive for myalgias and neck pain. Negative for back pain and joint pain.       Left shoulder pain and swelling.  Skin: Negative.  Negative for itching and rash.  Neurological: Negative for dizziness, tingling, focal weakness, weakness and headaches.  Endo/Heme/Allergies: Does not bruise/bleed easily.  Psychiatric/Behavioral: Negative for depression. The patient is not nervous/anxious and does not have insomnia.      MEDICAL HISTORY:  Past Medical History:  Diagnosis Date  . Abdominal pain 06/10/2018  . Abnormal cervical Papanicolaou smear 09/18/2017  . Anxiety   . Aortic atherosclerosis ([place])   . Arthritis    neck and knees  . 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 Blood transfusion without reported diagnosis   . Cervical cancer ([place]) 09/2016   mets lung  . Chronic anal fissure   . Chronic diarrhea   . Dyspnea   . Erosive gastropathy 09/18/2017  . Factor V Leiden mutation ([place])   . Fecal incontinence   . Genital warts   . GERD (gastroesophageal reflux disease)   . Heart murmur   . Hematochezia   . Hemorrhoids   . 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 Hepatitis, chronic ([place]) 05/05/2017  . History of cancer chemotherapy    completed 06/2017  . History of Clostridium difficile infection    while undergoing chemo.  Negative test 10/2017  . Ileocolic anastomotic leak   . 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 Intestinal infection due to Clostridium difficile 09/18/2017  . Macrocytic anemia with  vitamin B12 deficiency   . Multiple gastric ulcers   . Nausea vomiting and diarrhea   . Pancolitis ([place]) 07/27/2018  . Perianal condylomata   . Pneumonia    History of  . Pulmonary nodules   . Rectal bleeding   . Small bowel obstruction ([place]) 08/2017  . Stiff  neck    limited right turn  . Vitamin D deficiency     SURGICAL HISTORY: Past Surgical History:  Procedure Laterality Date  . CHOLECYSTECTOMY    . COLON SURGERY  08/2017   resection  . COLONOSCOPY WITH PROPOFOL N/A 12/20/2017   Procedure: COLONOSCOPY WITH PROPOFOL;  Surgeon: [name], MD;  Location: [facility] ENDOSCOPY;  Service: Gastroenterology;  Laterality: N/A;  . COLONOSCOPY WITH PROPOFOL N/A 07/30/2018   Procedure: COLONOSCOPY WITH PROPOFOL;  Surgeon: [name], MD;  Location: [facility] ENDOSCOPY;  Service: Gastroenterology;  Laterality: N/A;  . COLONOSCOPY WITH PROPOFOL N/A 10/10/2018   Procedure: COLONOSCOPY WITH PROPOFOL;  Surgeon: Lucilla Lame, MD;  Location: [facility] ENDOSCOPY;  Service: Endoscopy;  Laterality: N/A;  . DIAGNOSTIC LAPAROSCOPY    . ESOPHAGOGASTRODUODENOSCOPY (EGD) WITH PROPOFOL N/A 12/20/2017   Procedure: ESOPHAGOGASTRODUODENOSCOPY (EGD) WITH PROPOFOL;  Surgeon: [name], MD;  Location: [place];  Service: Gastroenterology;  Laterality: N/A;  . ESOPHAGOGASTRODUODENOSCOPY (EGD) WITH PROPOFOL  07/30/2018   Procedure: ESOPHAGOGASTRODUODENOSCOPY (EGD) WITH PROPOFOL;  Surgeon: [name], MD;  Location: [facility] ENDOSCOPY;  Service: Gastroenterology;;  . Otho Darner SIGMOIDOSCOPY N/A 11/21/2018   Procedure: FLEXIBLE SIGMOIDOSCOPY;  Surgeon: [name], MD;  Location: [facility] ENDOSCOPY;  Service: Gastroenterology;  Laterality: N/A;  . LAPAROTOMY N/A 08/31/2017   Procedure: EXPLORATORY LAPAROTOMY for SBO, ileocolectomy, removal of piece of uterine wall;  Surgeon: [name], MD;  Location: [facility] ORS;  Service: General;  Laterality: N/A;  . LASER ABLATION CONDOLAMATA N/A 02/22/2018   Procedure: LASER ABLATION/REMOVAL OF HFWYOVZCHYI AROUND ANUS AND VAGINA;  Surgeon: Michael Boston, MD;  Location: [place];  Service: General;  Laterality: N/A;  . OOPHORECTOMY    . PORTA CATH INSERTION N/A 05/13/2018   Procedure: PORTA CATH  INSERTION;  Surgeon: Katha Cabal, MD;  Location: [place] CV LAB;  Service: Cardiovascular;  Laterality: N/A;  . SMALL INTESTINE SURGERY    . TANDEM RING INSERTION     x3  . THORACOTOMY    . TOTAL KNEE ARTHROPLASTY Left 04/24/2018   Procedure: TOTAL KNEE ARTHROPLASTY;  Surgeon: Lovell Sheehan, MD;  Location: [facility] ORS;  Service: Orthopedics;  Laterality: Left;    SOCIAL HISTORY: Social History   Socioeconomic History  . Marital status: Divorced    Spouse name: Not on file  . Number of children: Not on file  . Years of education: Not on file  . Highest education level: Not on file  Occupational History  . Not on file  Social Needs  . Financial resource strain: Not hard at all  . Food insecurity    Worry: Never true    Inability: Never true  . Transportation needs    Medical: No    Non-medical: No  Tobacco Use  . Smoking status: Former Smoker    Quit date: 10/16/2006    Years since quitting: 12.5  . Smokeless tobacco: Never Used  Substance and Sexual Activity  . Alcohol use: Not Currently    Frequency: Never    Comment: seldom  . Drug use: Yes    Types: Marijuana    Comment: not very often  . Sexual activity: Not Currently  Birth control/protection: Post-menopausal    Comment: Not Asked  Lifestyle  . Physical activity    Days per week: Patient refused    Minutes per session: Patient refused  . Stress: Only a little  Relationships  . Social Herbalist on phone: Patient refused    Gets together: Patient refused    Attends religious service: Patient refused    Active member of club or organization: Patient refused    Attends meetings of clubs or organizations: Patient refused    Relationship status: Patient refused  . Intimate partner violence    Fear of current or ex partner: No    Emotionally abused: No    Physically abused: No    Forced sexual activity: No  Other Topics Concern  . Not on file  Social History Narrative  . Not on file     FAMILY HISTORY: Family History  Problem Relation Age of Onset  . Hypertension Father   . Diabetes Father   . Alcohol abuse Daughter   . Hypertension Maternal Grandmother   . Diabetes Maternal Grandmother   . Diabetes Paternal Grandmother   . Hypertension Paternal Grandmother     ALLERGIES:  is allergic to ketamine.  MEDICATIONS:  Current Facility-Administered Medications  Medication Dose Route Frequency Provider Last Rate Last Dose  . acetaminophen (TYLENOL) tablet 650 mg  650 mg Oral Q4H PRN Seals, Angela H, NP      . albuterol (PROVENTIL) (2.5 MG/3ML) 0.083% nebulizer solution 2.5-5 mg  2.5-5 mg Inhalation Q4H PRN Seals, Levada Dy H, NP      . amitriptyline (ELAVIL) tablet 75 mg  75 mg Oral QHS Seals, Angela H, NP      . calcium-vitamin D (OSCAL WITH D) 500-200 MG-UNIT per tablet 2 tablet  2 tablet Oral Daily Seals, Theo Dills, NP   2 tablet at 05/19/19 [phone]  . cyclobenzaprine (FLEXERIL) tablet 10 mg  10 mg Oral TID Sela Hua, MD   10 mg at 05/19/19 0806  . diphenoxylate-atropine (LOMOTIL) 2.5-0.025 MG per tablet 1 tablet  1 tablet Oral QID PRN Seals, Theo Dills, NP      . Eluxadoline TABS 100 mg  100 mg Oral BID Seals, Angela H, NP      . famotidine (PEPCID) tablet 20 mg  20 mg Oral BID PRN Seals, Levada Dy H, NP      . hydrALAZINE (APRESOLINE) injection 5 mg  5 mg Intravenous Q4H PRN Mayo, Pete Pelt, MD      . HYDROmorphone (DILAUDID) injection 1 mg  1 mg Intravenous Q3H PRN Mayo, Pete Pelt, MD   1 mg at 05/19/19 1309  . hyoscyamine (LEVBID) 0.375 MG 12 hr tablet 0.375 mg  0.375 mg Oral Q12H Seals, Angela H, NP   0.375 mg at 05/19/19 0806  . LORazepam (ATIVAN) injection 1 mg  1 mg Intravenous Once Nena Polio, MD      . LORazepam (ATIVAN) injection 1 mg  1 mg Intravenous Once Nena Polio, MD      . ondansetron [facility]) injection 4 mg  4 mg Intravenous Q6H PRN Seals, Theo Dills, NP   4 mg at 05/19/19 1313  . ondansetron (ZOFRAN-ODT) disintegrating tablet 4 mg  4 mg Oral Q8H  PRN Seals, Angela H, NP      . oxyCODONE (Oxy IR/ROXICODONE) immediate release tablet 10 mg  10 mg Oral Q6H PRN Mayo, Pete Pelt, MD      . oxyCODONE (OXYCONTIN) 12 hr tablet 15 mg  15 mg Oral Q12H Seals, Theo Dills, NP   15 mg at 05/19/19 4503  . pantoprazole (PROTONIX) EC tablet 40 mg  40 mg Oral Daily Seals, Theo Dills, NP   40 mg at 05/19/19 0806  . potassium chloride SA (K-DUR) CR tablet 20 mEq  20 mEq Oral BID Gardiner Barefoot H, NP   20 mEq at 05/19/19 0806  . rivaroxaban (XARELTO) tablet 20 mg  20 mg Oral Q supper Seals, Theo Dills, NP       Facility-Administered Medications Ordered in Other Encounters  Medication Dose Route Frequency Provider Last Rate Last Dose  . heparin lock flush 100 unit/mL  500 Units Intravenous Once Corcoran, Melissa C, MD      . sodium chloride 0.9 % 1,000 mL with potassium chloride 20 mEq infusion   Intravenous Once Honor Loh E, NP      . sodium chloride flush (NS) 0.9 % injection 10 mL  10 mL Intravenous Once Borders, Kirt Boys, NP          .  PHYSICAL EXAMINATION:  Vitals:   05/19/19 0121 05/19/19 0309  BP: (!) 157/103 (!) 160/103  Pulse: 84 (!) 107  Resp: 15   Temp:  98.7 F (37.1 C)  SpO2: 98% 98%   Filed Weights   05/19/19 0309  Weight: 156 lb 12 oz (71.1 kg)    Physical Exam  Constitutional: She is oriented to person, place, and time and well-developed, well-nourished, and in no distress.  Patient mild to moderate distress tearful because of pain.  She is alone.  HENT:  Head: Normocephalic and atraumatic.  Mouth/Throat: Oropharynx is clear and moist. No oropharyngeal exudate.  Eyes: Pupils are equal, round, and reactive to light.  Neck: Normal range of motion. Neck supple.  Cardiovascular: Normal rate and regular rhythm.  Pulmonary/Chest: Effort normal and breath sounds normal. No respiratory distress. She has no wheezes.  Abdominal: Soft. Bowel sounds are normal. She exhibits no distension and no mass. There is no abdominal tenderness. There  is no rebound and no guarding.  Musculoskeletal: Normal range of motion.        General: No tenderness or edema.     Comments: Swelling noted of the left shoulder.  Decreased range of motion at the left shoulder.  No masses felt.  No skin changes noted.  Neurological: She is alert and oriented to person, place, and time.  Skin: Skin is warm.  Psychiatric: Affect normal.     LABORATORY DATA:  I have reviewed the data as listed Lab Results  Component Value Date   WBC 4.1 05/18/2019   HGB 12.3 05/18/2019   HCT 37.0 05/18/2019   MCV 102.5 (H) 05/18/2019   PLT 168 05/18/2019   Recent Labs    06/11/18 0510  07/08/18 1035  10/21/18 0845  05/08/19 0833 05/15/19 0822 05/18/19 0935  NA 135   < > 136   < > 134*   < > 135 138 136  K 3.9   < > 4.2   < > 3.9   < > 3.7 3.6 3.6  CL 102   < > 107   < > 98   < > 102 106 101  CO2 27   < > 22   < > 24   < > 25 25 25   GLUCOSE 82   < > 119*   < > 111*   < > 99 105* 101*  BUN 11   < > 15   < >  17   < > 12 16 11   CREATININE 0.63   < > 0.71   < > 0.91   < > 0.81 0.73 0.69  CALCIUM 8.9   < > 8.7*   < > 9.6   < > 9.1 9.2 9.0  GFRNONAA >60   < > >60   < > >60   < > >60 >60 >60  GFRAA >60   < > >60   < > >60   < > >60 >60 >60  PROT 7.0   < > 6.5   < > 8.6*   < > 7.4 7.4 7.7  ALBUMIN 3.5   < > 3.4*   < > 4.4   < > 3.8 3.9 3.8  AST 41   < > 26   < > 48*   < > 27 32 37  ALT 51*   < > 33   < > 67*   < > 29 32 51*  ALKPHOS 171*   < > 132*   < > 185*   < > 143* 139* 181*  BILITOT 0.7   < > 0.4   < > 1.4*   < > 0.6 0.5 0.7  BILIDIR 0.1  --  <0.1  --  0.3*  --   --   --   --   IBILI 0.6  --  NOT CALCULATED  --   --   --   --   --   --    < > = values in this interval not displayed.    RADIOGRAPHIC STUDIES: I have personally reviewed the radiological images as listed and agreed with the findings in the report. Ct Head W Or Wo Contrast  Result Date: 05/18/2019 CLINICAL DATA:  53 year old female with history of metastatic cervical cancer. Severe pain  radiating from the skull base through the neck and left arm. EXAM: CT HEAD WITHOUT AND WITH CONTRAST CT NECK WITH CONTRAST TECHNIQUE: Contiguous axial images were obtained from the base of the skull through the vertex without and with intravenous contrast. Multidetector CT imaging of the and neck was performed using the standard protocol following the bolus administration of intravenous contrast. CONTRAST:  60m OMNIPAQUE IOHEXOL 300 MG/ML  SOLN COMPARISON:  Head CT without contrast 01/26/2019. Chest CT today reported separately. FINDINGS: CT HEAD FINDINGS Brain: Stable and normal cerebral volume. No midline shift, ventriculomegaly, mass effect, evidence of mass lesion, intracranial hemorrhage or evidence of cortically based acute infarction. Gray-white matter differentiation is within normal limits throughout the brain. No abnormal enhancement identified. Vascular: The major intracranial vascular structures are enhancing as expected. Skull: Stable, negative. Sinuses/Orbits: Visualized paranasal sinuses and mastoids are stable and well pneumatized. Other: Visualized orbits and scalp soft tissues are within normal limits. CT NECK FINDINGS Pharynx and larynx: Laryngeal and pharyngeal soft tissue contours are within normal limits. Negative parapharyngeal and retropharyngeal spaces. Salivary glands: Negative sublingual space. Submandibular and parotid glands appear symmetric and normal. Thyroid: Negative. Lymph nodes: Negative.  No cervical lymphadenopathy. Vascular: Right IJ approach porta cath. The major vascular structures in the neck and at the skull base are patent. Mastoids and visualized paranasal sinuses: Clear. Skeleton: Absent dentition. Widespread advanced cervical disc and endplate degeneration. Upper cervical facet degeneration with mild degenerative appearing anterolisthesis of C3 on C4. Degenerative appearing bone mineralization heterogeneity in the odontoid. No acute or suspicious osseous lesion  identified at the skull base or neck. Upper chest: Reported separately today. IMPRESSION: 1. No metastatic disease identified  in the head or neck. 2.  Normal CT appearance of the brain. 3. Negative neck CT aside from widespread cervical spine degeneration. 4.  CT Chest today reported separately. Electronically Signed   By: Genevie Ann M.D.   On: 05/18/2019 14:42   Ct Soft Tissue Neck W Contrast  Result Date: 05/18/2019 CLINICAL DATA:  53 year old female with history of metastatic cervical cancer. Severe pain radiating from the skull base through the neck and left arm. EXAM: CT HEAD WITHOUT AND WITH CONTRAST CT NECK WITH CONTRAST TECHNIQUE: Contiguous axial images were obtained from the base of the skull through the vertex without and with intravenous contrast. Multidetector CT imaging of the and neck was performed using the standard protocol following the bolus administration of intravenous contrast. CONTRAST:  19m OMNIPAQUE IOHEXOL 300 MG/ML  SOLN COMPARISON:  Head CT without contrast 01/26/2019. Chest CT today reported separately. FINDINGS: CT HEAD FINDINGS Brain: Stable and normal cerebral volume. No midline shift, ventriculomegaly, mass effect, evidence of mass lesion, intracranial hemorrhage or evidence of cortically based acute infarction. Gray-white matter differentiation is within normal limits throughout the brain. No abnormal enhancement identified. Vascular: The major intracranial vascular structures are enhancing as expected. Skull: Stable, negative. Sinuses/Orbits: Visualized paranasal sinuses and mastoids are stable and well pneumatized. Other: Visualized orbits and scalp soft tissues are within normal limits. CT NECK FINDINGS Pharynx and larynx: Laryngeal and pharyngeal soft tissue contours are within normal limits. Negative parapharyngeal and retropharyngeal spaces. Salivary glands: Negative sublingual space. Submandibular and parotid glands appear symmetric and normal. Thyroid: Negative. Lymph  nodes: Negative.  No cervical lymphadenopathy. Vascular: Right IJ approach porta cath. The major vascular structures in the neck and at the skull base are patent. Mastoids and visualized paranasal sinuses: Clear. Skeleton: Absent dentition. Widespread advanced cervical disc and endplate degeneration. Upper cervical facet degeneration with mild degenerative appearing anterolisthesis of C3 on C4. Degenerative appearing bone mineralization heterogeneity in the odontoid. No acute or suspicious osseous lesion identified at the skull base or neck. Upper chest: Reported separately today. IMPRESSION: 1. No metastatic disease identified in the head or neck. 2.  Normal CT appearance of the brain. 3. Negative neck CT aside from widespread cervical spine degeneration. 4.  CT Chest today reported separately. Electronically Signed   By: HGenevie AnnM.D.   On: 05/18/2019 14:42   Ct Chest W Contrast  Result Date: 05/18/2019 CLINICAL DATA:  Hx of cervical cancer with mets to the lungs. Severe pain from base of skull to chest and left arm. Concern for further mets. EXAM: CT CHEST WITH CONTRAST TECHNIQUE: Multidetector CT imaging of the chest was performed during intravenous contrast administration. CONTRAST:  796mOMNIPAQUE IOHEXOL 300 MG/ML  SOLN COMPARISON:  04/21/2019 FINDINGS: Cardiovascular: Heart is normal in size and configuration. No pericardial effusion. Great vessels are normal in caliber. Minor aortic atherosclerosis. No dissection. Arch branch vessels are widely patent. Mediastinum/Nodes: Visualized thyroid gland is unremarkable. No neck base or axillary masses or enlarged lymph nodes. No mediastinal or hilar masses or adenopathy. Trachea and esophagus are unremarkable. Right internal jugular Port-A-Cath tip projects in the right atrium. Lungs/Pleura: Numerous bilateral metastatic pulmonary nodules with an increase in number and size compared to the prior CT. Largest nodule lies at the base of the right lower lobe, image  37, series 9, 14 mm, previously 12 mm. Nodule in the posteromedial base of the left lower lobe, image 44, measures 10 mm, previously 7 mm. Multiple nodules shows central cavitation. No evidence of pneumonia or  pulmonary edema. No pleural effusion or pneumothorax. Upper Abdomen: No acute abnormality. Musculoskeletal: No fracture or acute finding. No osteoblastic or osteolytic lesions. IMPRESSION: 1. No acute findings in the chest. 2. Progression of metastatic disease to the lungs with an increase in the size and number of metastatic lung nodules. No other evidence of metastatic disease in the chest. Aortic Atherosclerosis (ICD10-I70.0). Electronically Signed   By: Lajean Manes M.D.   On: 05/18/2019 14:47   Ct Chest W Contrast  Result Date: 04/21/2019 CLINICAL DATA:  Cervical cancer with lung metastasis. Left chest pain and right lower quadrant pain. Metastatic cervical cancer. Nausea vomiting and diarrhea. EXAM: CT CHEST, ABDOMEN, AND PELVIS WITH CONTRAST TECHNIQUE: Multidetector CT imaging of the chest, abdomen and pelvis was performed following the standard protocol during bolus administration of intravenous contrast. CONTRAST:  168m OMNIPAQUE IOHEXOL 300 MG/ML  SOLN COMPARISON:  Abdominopelvic CT 01/23/2019. Most recent chest CT 09/30/2018. FINDINGS: CT CHEST FINDINGS Cardiovascular: Aortic atherosclerosis. Normal heart size, without pericardial effusion. Right Port-A-Cath tip at high right atrium. No central pulmonary embolism, on this non-dedicated study. Mediastinum/Nodes: No supraclavicular adenopathy. No mediastinal or hilar adenopathy. Lungs/Pleura: No pleural fluid. Bilateral pulmonary nodules, many of which are cavitary. Comparing the nodules at the lung bases to the 01/23/2019 exam, an index right lung base 12 mm nasion on image 92/4 measured 11 mm on the prior exam (when remeasured). Index left lower lobe pulmonary nodule measures 9 mm on 115/4 versus 8 mm on the prior. A right middle lobe pulmonary  nodule measures 8 mm on image 88/4 versus 7 mm on 01/23/2019. Musculoskeletal: No acute osseous abnormality. CT ABDOMEN PELVIS FINDINGS Hepatobiliary: No focal liver lesion. Cholecystectomy with similar biliary duct dilatation, with the common duct measuring 1.6 cm in the porta hepatis just above the pancreatic head today versus similar to on the prior (when remeasured). No obstructive stone or mass. Pancreas: Normal, without mass or ductal dilatation. Spleen: Normal in size, without focal abnormality. Adrenals/Urinary Tract: Normal adrenal glands. 3 mm inter/lower pole right renal collecting system calculus. Suspect a punctate interpolar left renal collecting system stone. Normal urinary bladder. Stomach/Bowel: Normal stomach, without wall thickening. The rectum and sigmoid demonstrate mild mucosal hyperenhancement, including on image 102/2. Normal terminal ileum. Normal small bowel. Vascular/Lymphatic: Aortic and branch vessel atherosclerosis. Small abdominal retroperitoneal nodes are not pathologic by size criteria and felt to be similar. index gastrohepatic ligament node is unchanged at 12 mm. No abdominopelvic adenopathy. Reproductive: Normal uterus and adnexa. Other: No significant free fluid. No evidence of omental or peritoneal disease. Presacral soft tissue thickening is similar. No significant free fluid. Musculoskeletal: Mild osteopenia. Lumbosacral spondylosis which is age advanced. Convex left lumbar spine curvature. IMPRESSION: CT CHEST IMPRESSION 1. Since the abdominal CT of 01/23/2019, mild progression of pulmonary metastasis. 2. No thoracic adenopathy. 3.  Aortic Atherosclerosis (ICD10-I70.0).  This is age advanced. CT ABDOMEN AND PELVIS IMPRESSION 1.  No  evidence of metastatic disease in the abdomen or pelvis. 2. Cholecystectomy with similar moderate common duct dilatation, without cause identified. 3. Presacral soft tissue thickening is unchanged and likely radiation induced. There is  rectosigmoid mucosal hyperenhancement which is likely also radiation induced. Acute infectious or inflammatory colitis cannot be excluded. 4. Right and probable left nephrolithiasis. Electronically Signed   By: KAbigail MiyamotoM.D.   On: 04/21/2019 14:20   Mr Cervical Spine Wo Contrast  Result Date: 05/18/2019 CLINICAL DATA:  LEFT neck and shoulder pain. Pain is severe, for 3 days. Unable to lift  LEFT arm. EXAM: MRI CERVICAL SPINE WITHOUT CONTRAST TECHNIQUE: Multiplanar, multisequence MR imaging of the cervical spine was performed. No intravenous contrast was administered. COMPARISON:  CT of the neck earlier today. FINDINGS: The patient was unable to remain motionless for the exam. Small or subtle lesions could be overlooked. Alignment: Straightening, and slight reversal of the normal cervical lordosis. Trace anterolisthesis C2-3, C3-4, and C4-5 are facet mediated. Vertebrae: Endplate reactive changes, C3 through C7. No visible metastatic disease. Cord: Limited assessment, no definite impingement or abnormal signal. Posterior Fossa, vertebral arteries, paraspinal tissues: No tonsillar herniation. Vertebral flow voids are maintained. No neck masses. Disc levels: C2-3: Mild degenerative anterolisthesis. Annular bulge. No impingement. C3-4: Disc space narrowing. 2 mm degenerative anterolisthesis. Bulky RIGHT facet arthrosis. BILATERAL RIGHT greater than LEFT C4 foraminal narrowing. C4-5: Disc space narrowing with osseous spurring. 1-2 mm anterolisthesis. Bulky RIGHT facet arthropathy. RIGHT greater than LEFT C5 foraminal narrowing. C5-6: Disc space narrowing with osseous spurring. Mild stenosis. Shallow central protrusion. BILATERAL C6 foraminal narrowing due to uncinate spurring. C6-7: Disc space narrowing. Osseous spurring. Shallow central protrusion. Effacement anterior subarachnoid space. Borderline BILATERAL C7 foraminal narrowing. C7-T1: Unremarkable. IMPRESSION: Multilevel spondylosis as described. BILATERAL  foraminal narrowing at C5-6 and C6-7 could affect the RIGHT or LEFT exiting nerve roots at those levels, but there is no clearly asymmetric or predominant LEFT-sided compressive lesion. Within limits for detection on this motion degraded exam, no metastatic disease, epidural hematoma, significant spinal stenosis, or dominant/lateralizing disc protrusion. Electronically Signed   By: Staci Righter M.D.   On: 05/18/2019 21:25   Ct Abdomen Pelvis W Contrast  Result Date: 04/21/2019 CLINICAL DATA:  Cervical cancer with lung metastasis. Left chest pain and right lower quadrant pain. Metastatic cervical cancer. Nausea vomiting and diarrhea. EXAM: CT CHEST, ABDOMEN, AND PELVIS WITH CONTRAST TECHNIQUE: Multidetector CT imaging of the chest, abdomen and pelvis was performed following the standard protocol during bolus administration of intravenous contrast. CONTRAST:  129m OMNIPAQUE IOHEXOL 300 MG/ML  SOLN COMPARISON:  Abdominopelvic CT 01/23/2019. Most recent chest CT 09/30/2018. FINDINGS: CT CHEST FINDINGS Cardiovascular: Aortic atherosclerosis. Normal heart size, without pericardial effusion. Right Port-A-Cath tip at high right atrium. No central pulmonary embolism, on this non-dedicated study. Mediastinum/Nodes: No supraclavicular adenopathy. No mediastinal or hilar adenopathy. Lungs/Pleura: No pleural fluid. Bilateral pulmonary nodules, many of which are cavitary. Comparing the nodules at the lung bases to the 01/23/2019 exam, an index right lung base 12 mm nasion on image 92/4 measured 11 mm on the prior exam (when remeasured). Index left lower lobe pulmonary nodule measures 9 mm on 115/4 versus 8 mm on the prior. A right middle lobe pulmonary nodule measures 8 mm on image 88/4 versus 7 mm on 01/23/2019. Musculoskeletal: No acute osseous abnormality. CT ABDOMEN PELVIS FINDINGS Hepatobiliary: No focal liver lesion. Cholecystectomy with similar biliary duct dilatation, with the common duct measuring 1.6 cm in the  porta hepatis just above the pancreatic head today versus similar to on the prior (when remeasured). No obstructive stone or mass. Pancreas: Normal, without mass or ductal dilatation. Spleen: Normal in size, without focal abnormality. Adrenals/Urinary Tract: Normal adrenal glands. 3 mm inter/lower pole right renal collecting system calculus. Suspect a punctate interpolar left renal collecting system stone. Normal urinary bladder. Stomach/Bowel: Normal stomach, without wall thickening. The rectum and sigmoid demonstrate mild mucosal hyperenhancement, including on image 102/2. Normal terminal ileum. Normal small bowel. Vascular/Lymphatic: Aortic and branch vessel atherosclerosis. Small abdominal retroperitoneal nodes are not pathologic by size criteria and felt to be  similar. index gastrohepatic ligament node is unchanged at 12 mm. No abdominopelvic adenopathy. Reproductive: Normal uterus and adnexa. Other: No significant free fluid. No evidence of omental or peritoneal disease. Presacral soft tissue thickening is similar. No significant free fluid. Musculoskeletal: Mild osteopenia. Lumbosacral spondylosis which is age advanced. Convex left lumbar spine curvature. IMPRESSION: CT CHEST IMPRESSION 1. Since the abdominal CT of 01/23/2019, mild progression of pulmonary metastasis. 2. No thoracic adenopathy. 3.  Aortic Atherosclerosis (ICD10-I70.0).  This is age advanced. CT ABDOMEN AND PELVIS IMPRESSION 1.  No  evidence of metastatic disease in the abdomen or pelvis. 2. Cholecystectomy with similar moderate common duct dilatation, without cause identified. 3. Presacral soft tissue thickening is unchanged and likely radiation induced. There is rectosigmoid mucosal hyperenhancement which is likely also radiation induced. Acute infectious or inflammatory colitis cannot be excluded. 4. Right and probable left nephrolithiasis. Electronically Signed   By: Abigail Miyamoto M.D.   On: 04/21/2019 14:20   US Venous Img Upper Uni  Left  Result Date: 05/18/2019 CLINICAL DATA:  Pain and swelling for 4 days. EXAM: LEFT UPPER EXTREMITY VENOUS DOPPLER ULTRASOUND TECHNIQUE: Gray-scale sonography with graded compression, as well as color Doppler and duplex ultrasound were performed to evaluate the upper extremity deep venous system from the level of the subclavian vein and including the jugular, axillary, basilic, radial, ulnar and upper cephalic vein. Spectral Doppler was utilized to evaluate flow at rest and with distal augmentation maneuvers. COMPARISON:  None. FINDINGS: Contralateral Subclavian Vein: Respiratory phasicity is normal and symmetric with the symptomatic side. No evidence of thrombus. Normal compressibility. Internal Jugular Vein: No evidence of thrombus. Normal compressibility, respiratory phasicity and response to augmentation. Subclavian Vein: No evidence of thrombus. Normal compressibility, respiratory phasicity and response to augmentation. Axillary Vein: No evidence of thrombus. Normal compressibility, respiratory phasicity and response to augmentation. Cephalic Vein: No evidence of thrombus. Normal compressibility, respiratory phasicity and response to augmentation. Basilic Vein: No evidence of thrombus. Normal compressibility, respiratory phasicity and response to augmentation. Brachial Veins: No evidence of thrombus. Normal compressibility, respiratory phasicity and response to augmentation. Radial Veins: No evidence of thrombus. Normal compressibility, respiratory phasicity and response to augmentation. Ulnar Veins: No evidence of thrombus. Normal compressibility, respiratory phasicity and response to augmentation. Venous Reflux:  None visualized. Other Findings:  None visualized. IMPRESSION: No evidence of DVT within the left upper extremity. Electronically Signed   By: Lovey Newcomer M.D.   On: 05/18/2019 10:40   Dg Shoulder Left  Result Date: 05/19/2019 CLINICAL DATA:  Left shoulder pain.  No known injury. EXAM: LEFT  SHOULDER - 2+ VIEW COMPARISON:  Chest CT 05/18/2019 FINDINGS: No fracture or dislocation is identified. No obvious bone lesions. Small calcifications are noted near the acromion which could be unfused ossification centers or insertional deltoid ligament calcific tendinopathy. Diffuse pulmonary metastatic disease is noted. IMPRESSION: No acute bony findings or obvious metastatic bone lesions. Numerous pulmonary metastatic lesions. Electronically Signed   By: Marijo Sanes M.D.   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  Status post cycle #1.  May 18, 2019 CT scan mild progression of the lung nodules noted however-too early to assess response from immunotherapy.  We will continue immunotherapy outpatient.  #Left neck/shoulder pain-swelling noted.  MRI of the neck negative for acute  process.  Recommend imaging; and also evaluation with orthopedics.  This seems to be unrelated to her malignancy.  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   # adrenal insuffiency- on maintenance steroids- stable.   Thank you Dr.Mayo for allowing me to participate in the care of your pleasant patient. Please do not hesitate to contact me with questions or concerns in the interim.  Discussed with Dr. Brett Albino.   All questions were answered. The patient knows to call the clinic with any problems, questions or concerns.    Cammie Sickle, MD 05/19/2019 2:29 PM

## 2019-05-19 NOTE — Progress Notes (Signed)
Subjective:     Left shoulder was prepped sterilly and300 cc of cloudy yellow fluid removed.  Sent for cell count, differential, crystals, and culture. No cortisone in case of infection.    Patient reports pain as severe.  Objective:   VITALS:   Vitals:   05/19/19 1604 05/19/19 1702  BP: (!) 165/102 (!) 158/98  Pulse: 85   Resp: 20   Temp: 98.5 F (36.9 C)   SpO2: 98%     Neurologically intact  LABS Recent Labs    05/18/19 0935  HGB 12.3  HCT 37.0  WBC 4.1  PLT 168    Recent Labs    05/18/19 0935  NA 136  K 3.6  BUN 11  CREATININE 0.69  GLUCOSE 101*    No results for input(s): LABPT, INR in the last 72 hours.   Assessment/Plan:   Ice to shoulder Await cultures

## 2019-05-20 ENCOUNTER — Ambulatory Visit: Payer: Medicaid Other | Admitting: Surgery

## 2019-05-20 LAB — BASIC METABOLIC PANEL
Anion gap: 11 (ref 5–15)
BUN: 10 mg/dL (ref 6–20)
CO2: 28 mmol/L (ref 22–32)
Calcium: 9.1 mg/dL (ref 8.9–10.3)
Chloride: 95 mmol/L — ABNORMAL LOW (ref 98–111)
Creatinine, Ser: 0.64 mg/dL (ref 0.44–1.00)
GFR calc Af Amer: >60 mL/min (ref >60)
GFR calc non Af Amer: >60 mL/min (ref >60)
Glucose, Bld: 111 mg/dL — ABNORMAL HIGH (ref 70–99)
Potassium: 3.8 mmol/L (ref 3.5–5.1)
Sodium: 134 mmol/L — ABNORMAL LOW (ref 135–145)

## 2019-05-20 LAB — CBC
HCT: 34.1 % — ABNORMAL LOW (ref 36.0–46.0)
Hemoglobin: 11.2 g/dL — ABNORMAL LOW (ref 12.0–15.0)
MCH: 33.7 pg (ref 26.0–34.0)
MCHC: 32.8 g/dL (ref 30.0–36.0)
MCV: 102.7 fL — ABNORMAL HIGH (ref 80.0–100.0)
Platelets: 150 10*3/uL (ref 150–400)
RBC: 3.32 MIL/uL — ABNORMAL LOW (ref 3.87–5.11)
RDW: 13.1 % (ref 11.5–15.5)
WBC: 4.6 10*3/uL (ref 4.0–10.5)
nRBC: 0 % (ref 0.0–0.2)

## 2019-05-20 LAB — URIC ACID: Uric Acid, Serum: 4.5 mg/dL (ref 2.5–7.1)

## 2019-05-20 MED ORDER — SODIUM CHLORIDE 0.9 % IV SOLN
INTRAVENOUS | Status: DC | PRN
  Administered 2019-05-20: 15 mL via INTRAVENOUS
  Administered 2019-05-21: 16:00:00 20 mL via INTRAVENOUS

## 2019-05-20 MED ORDER — COLCHICINE 0.6 MG PO TABS
0.6000 mg | ORAL_TABLET | Freq: Once | ORAL | Status: AC
  Administered 2019-05-20: 14:00:00 0.6 mg via ORAL
  Filled 2019-05-20: qty 1

## 2019-05-20 MED ORDER — VANCOMYCIN HCL IN DEXTROSE 1-5 GM/200ML-% IV SOLN
1000.0000 mg | Freq: Two times a day (BID) | INTRAVENOUS | Status: DC
  Administered 2019-05-20 – 2019-05-21 (×3): 1000 mg via INTRAVENOUS
  Filled 2019-05-20 (×6): qty 200

## 2019-05-20 MED ORDER — VANCOMYCIN HCL 10 G IV SOLR
1500.0000 mg | Freq: Once | INTRAVENOUS | Status: DC
  Filled 2019-05-20: qty 1500

## 2019-05-20 MED ORDER — VANCOMYCIN HCL 1.5 G IV SOLR
1500.0000 mg | Freq: Once | INTRAVENOUS | Status: AC
  Administered 2019-05-20: 1500 mg via INTRAVENOUS
  Filled 2019-05-20: qty 1500

## 2019-05-20 MED ORDER — KETOROLAC TROMETHAMINE 15 MG/ML IJ SOLN
15.0000 mg | Freq: Four times a day (QID) | INTRAMUSCULAR | Status: DC | PRN
  Administered 2019-05-20: 15 mg via INTRAVENOUS
  Filled 2019-05-20 (×5): qty 1

## 2019-05-20 MED ORDER — HYDROMORPHONE HCL 1 MG/ML IJ SOLN
1.0000 mg | INTRAMUSCULAR | Status: DC | PRN
  Administered 2019-05-20 – 2019-05-21 (×8): 1 mg via INTRAVENOUS
  Filled 2019-05-20 (×8): qty 1

## 2019-05-20 MED ORDER — SODIUM CHLORIDE 0.9 % IV SOLN
2.0000 g | Freq: Three times a day (TID) | INTRAVENOUS | Status: DC
  Administered 2019-05-20 – 2019-05-22 (×6): 2 g via INTRAVENOUS
  Filled 2019-05-20 (×9): qty 2

## 2019-05-20 NOTE — Progress Notes (Signed)
Pharmacy Antibiotic Note  Joanna Hall is a 53 y.o. female admitted on 05/18/2019 with possible septic arthritis of left shoulder.  Pharmacy has been consulted for Vancomycin and Cefepime dosing.  Plan: Cefepime 2g IV q8h  Vancomycin 1571m IV loading dose followed by  Vancomycin 1000 mg IV Q 12 hrs. Goal AUC 400-550. Expected AUC: 527.5 SCr used: 0.8 Expected Cmin: 14.7   Height: 5' 8"  (172.7 cm) Weight: 156 lb 12 oz (71.1 kg) IBW/kg (Calculated) : 63.9  Temp (24hrs), Avg:98.7 F (37.1 C), Min:98.5 F (36.9 C), Max:99 F (37.2 C)  Recent Labs  Lab 05/15/19 0822 05/18/19 0935 05/20/19 0544  WBC 4.8 4.1 4.6  CREATININE 0.73 0.69 0.64    Estimated Creatinine Clearance: 83.9 mL/min (by C-G formula based on SCr of 0.64 mg/dL).    Allergies  Allergen Reactions  . Ketamine Anxiety and Other (See Comments)    Syncope episode/confusion     Antimicrobials this admission: Vancomycin 8/4 >>  Cefepime 8/4 >>   Microbiology results: 8/4 BCx: pending 8/3 Joint aspiration Cx: pending   Thank you for allowing pharmacy to be a part of this patient's care.  LPaulina Fusi PharmD, BCPS 05/20/2019 10:38 AM

## 2019-05-20 NOTE — TOC Initial Note (Signed)
Transition of Care [facility]) - Initial/Assessment Note    Patient Details  Name: Joanna Hall MRN: [id number] Date of Birth: 09-13-1966  Transition of Care [facility]) CM/SW Contact:    Shelbie Hutching, RN Phone Number: 05/20/2019, 3:38 PM  Clinical Narrative:                  Patient admitted with intractable left shoulder pain and swelling, ortho is following.  Patient has metastatic disease to the lungs, followed by palliative care in the cancer center.  Patient reports that she lives at home with her parents.  Patient reports she is completely independent and she can drive.  RNCM will follow hospital course and continue to evaluate for any discharge needs.   Expected Discharge Plan: Home/Self Care Barriers to Discharge: Continued Medical Work up   Patient Goals and CMS Choice Patient states their goals for this hospitalization and ongoing recovery are:: Get rid of the pain in my shoulder      Expected Discharge Plan and Services Expected Discharge Plan: Home/Self Care       Living arrangements for the past 2 months: Single Family Home                                      Prior Living Arrangements/Services Living arrangements for the past 2 months: Single Family Home Lives with:: Parents Patient language and need for interpreter reviewed:: No        Need for Family Participation in Patient Care: Yes (Comment)(cancer) Care giver support system in place?: Yes (comment)(Parents)   Criminal Activity/Legal Involvement Pertinent to Current Situation/Hospitalization: No - Comment as needed  Activities of Daily Living Home Assistive Devices/Equipment: None ADL Screening (condition at time of admission) Patient's cognitive ability adequate to safely complete daily activities?: Yes Is the patient deaf or have difficulty hearing?: No Does the patient have difficulty seeing, even when wearing glasses/contacts?: No Does the patient have difficulty concentrating,  remembering, or making decisions?: No Patient able to express need for assistance with ADLs?: Yes Does the patient have difficulty dressing or bathing?: No Independently performs ADLs?: Yes (appropriate for developmental age) Does the patient have difficulty walking or climbing stairs?: Yes Weakness of Legs: Both Weakness of Arms/Hands: None  Permission Sought/Granted Permission sought to share information with : Case Manager Permission granted to share information with : Yes, Verbal Permission Granted              Emotional Assessment Appearance:: Appears older than stated age Attitude/Demeanor/Rapport: Engaged Affect (typically observed): Accepting Orientation: : Oriented to Self, Oriented to Place, Oriented to  Time, Oriented to Situation Alcohol / Substance Use: Not Applicable Psych Involvement: No (comment)  Admission diagnosis:  Neck pain [M54.2] Left arm pain [M79.602] Chest pain, unspecified type [R07.9] Patient Active Problem List   Diagnosis Date Noted  . Chest pain in adult 05/19/2019  . Intractable pain 05/19/2019  . Cancer associated pain   . Proctitis, radiation   . Palliative care encounter   . Encounter for monitoring opioid maintenance therapy 11/19/2018  . GI bleed 10/08/2018  . Adrenal insufficiency ([place]) 09/03/2018  . Chronic diarrhea 06/25/2018  . Chronic anticoagulation 06/25/2018  . Vomiting   . Encounter for antineoplastic chemotherapy 06/04/2018  . 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 Lung metastasis ([place]) 05/15/2018  . Closed fracture of distal end of radius 05/04/2018  . History of total knee arthroplasty 04/24/2018  .  Osteoarthritis of left knee 02/28/2018  . Condyloma acuminatum of anus s/p ablation 02/22/2018 02/22/2018  . Condyloma acuminatum of vagina s/p ablation 02/22/2018 02/22/2018  . Positive ANA (antinuclear antibody) 02/04/2018  . Aortic atherosclerosis ([place]) 01/15/2018  . Elevated MCV 01/15/2018  . Anemia 01/15/2018  .  Genital warts 01/15/2018  . Vitamin D deficiency 01/15/2018  . Pernicious anemia   . Impingement syndrome of shoulder region 12/19/2017  . Multiple joint pain 12/19/2017  . Osteoarthritis of right knee 12/19/2017  . B12 deficiency 12/11/2017  . Lung nodules 12/11/2017  . History of Clostridium difficile infection 10/23/2017  . Factor V Leiden ([place]) 10/19/2017  . Goals of care, counseling/discussion 10/19/2017  . Cervical cancer, FIGO stage IB1 ([place]) 09/18/2017  . Chronic hepatitis C without hepatic coma ([place]) 09/18/2017  . Cytopenia 09/18/2017  . Diarrhea 09/18/2017  . Lower abdominal pain 09/18/2017  . 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 Malignant neoplasm of overlapping sites of cervix ([place]) 09/18/2017  . Renal insufficiency 09/18/2017  . Wound infection after surgery 09/12/2017  . Hypokalemia   . Hypomagnesemia   . Cervical arthritis 07/18/2017  . Dysuria 06/20/2017  . Metastatic cancer ([place]) 05/12/2017  . Essential hypertension 03/15/2017  . Anemia in other chronic diseases classified elsewhere 03/01/2017  . Chemotherapy-induced neutropenia ([place]) 01/29/2017  . 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[place]) 09/25/2016   PCP:  [place]:   [place] [place] [address] Phone: [phone] Fax: [phone]  [place] ([place]), [place]) [place] [address] Phone: [phone] Fax: [phone]     Social Determinants of Health (SDOH) Interventions    Readmission Risk Interventions No flowsheet data found.

## 2019-05-20 NOTE — Progress Notes (Signed)
[place] at [place] NAME: Joanna Hall    MR#:  [id number]  DATE OF BIRTH:  07-Nov-1965  SUBJECTIVE:   Patient states her pain is little bit better today.  She feels like her left shoulder is swollen again.  She has not noticed any warmth or redness. No fevers or chills.  REVIEW OF SYSTEMS:  Review of Systems  Constitutional: Negative for chills and fever.  HENT: Negative for congestion and sore throat.   Eyes: Negative for blurred vision and double vision.  Respiratory: Negative for cough and shortness of breath.   Cardiovascular: Positive for chest pain. Negative for palpitations.  Gastrointestinal: Negative for nausea and vomiting.  Genitourinary: Negative for dysuria and urgency.  Musculoskeletal: Positive for joint pain and neck pain. Negative for back pain.  Neurological: Negative for dizziness and headaches.  Psychiatric/Behavioral: Negative for depression. The patient is not nervous/anxious.     DRUG ALLERGIES:   Allergies  Allergen Reactions  . Ketamine Anxiety and Other (See Comments)    Syncope episode/confusion    VITALS:  Blood pressure 124/79, pulse 94, temperature 98.6 F (37 C), temperature source Oral, resp. rate 18, height 5' 8"  (1.727 m), weight 71.1 kg, SpO2 98 %. PHYSICAL EXAMINATION:  Physical Exam  GENERAL:  Laying in the bed, in NAD. Chronically ill-appearing. HEENT: Head atraumatic, normocephalic. Pupils equal, round, reactive to light and accommodation. No scleral icterus. Extraocular muscles intact. Oropharynx and nasopharynx clear.  NECK:  Supple, no jugular venous distention. No thyroid enlargement. LUNGS: Lungs are clear to auscultation bilaterally. No wheezes, crackles, rhonchi. No use of accessory muscles of respiration.  CARDIOVASCULAR: RRR, S1, S2 normal. No murmurs, rubs, or gallops.  ABDOMEN: Soft, nontender, nondistended. Bowel sounds present.  EXTREMITIES: No pedal edema, cyanosis, or  clubbing. + Left shoulder swelling.  No erythema or warmth.  Unable to assess range of motion due to pain. NEUROLOGIC: CN 2-12 intact, no focal deficits. No focal deficits. Sensation intact throughout. Gait not checked.  PSYCHIATRIC: The patient is alert and oriented x 3.  SKIN: No obvious rash, lesion, or ulcer.  LABORATORY PANEL:  Female CBC Recent Labs  Lab 05/20/19 0544  WBC 4.6  HGB 11.2*  HCT 34.1*  PLT 150   ------------------------------------------------------------------------------------------------------------------ Chemistries  Recent Labs  Lab 05/15/19 0822 05/18/19 0935 05/20/19 0544  NA 138 136 134*  K 3.6 3.6 3.8  CL 106 101 95*  CO2 25 25 28   GLUCOSE 105* 101* 111*  BUN 16 11 10   CREATININE 0.73 0.69 0.64  CALCIUM 9.2 9.0 9.1  MG 1.6*  --   --   AST 32 37  --   ALT 32 51*  --   ALKPHOS 139* 181*  --   BILITOT 0.5 0.7  --    RADIOLOGY:  No results found. ASSESSMENT AND PLAN:   Intractable left shoulder pain and swelling- concern for possible septic arthritis. Patient is afebrile and has a normal WBC count, but she may not develop leukocytosis because of her chemo. -MRI C-spine with multilevel spondylosis, bilateral foraminal narrowing at C5-6 and C6-7, but no clear compressive lesions or bony mets -s/p joint aspiration 8/3 by Dr. Sabra Heck- fluid analysis showed 25,000 WBC with 92% neutrophils -Awaiting joint aspiration fluid culture (gram stain with no organisms) -Will start patient on broad spectrum antibiotics -Blood cultures ordered -Serum uric acid was normal- will try colchicine to see if this helps -NPO at midnight in case of possible washout with ortho  tomorrow  Chest pain- likely musculoskeletal in etiology.  PE unlikely, as patient is on Xarelto. -CT chest with progression of metastatic disease to the lungs -Troponin negative x 4, EKG without signs of ischemia -Pain control, as above  Metastatic cervical cancer-CT chest showed progression  of metastatic disease.  Follows with Dr. Lynett Fish as an outpatient. -Oncology following  Hypertension-likely secondary to pain.  BP has improved. -IV hydralazine PRN  COPD- stable, no signs of acute exacerbation -Continue PRN duo nebs  GERD- stable -Continue Protonix  Factor V Leiden -Continue home xarelto  All the records are reviewed and case discussed with Care Management/Social Worker. Management plans discussed with the patient, family and they are in agreement.  CODE STATUS: Full Code  TOTAL TIME TAKING CARE OF THIS PATIENT: 40 minutes.   More than 50% of the time was spent in counseling/coordination of care: YES  POSSIBLE D/C IN 1-2 DAYS, DEPENDING ON CLINICAL CONDITION.   Berna Spare Alyona Romack M.D on 05/20/2019 at 11:24 AM  Between 7am to 6pm - Pager - [phone]  After 6pm go to [email or web address] - Proofreader  [employer] [place] [facility]  Office  [phone]  CC: Primary care physician; [place]  Note: This dictation was prepared with Dragon dictation along with smaller phrase technology. Any transcriptional errors that result from this process are unintentional.

## 2019-05-20 NOTE — Progress Notes (Signed)
Joanna Hall   DOB:Aug 09, 1966   UP#:[id number]    Subjective: Patient continues to complain of pain in the left shoulder.  Worse with movement.  Difficulty sleeping at night because of pain.  Otherwise chronic mild diarrhea not any worse.  Objective:  Vitals:   05/19/19 2049 05/20/19 0345  BP: (!) 143/94 124/79  Pulse: 96 94  Resp: 18 18  Temp: 99 F (37.2 C) 98.6 F (37 C)  SpO2: 95% 98%     Intake/Output Summary (Last 24 hours) at 05/20/2019 1350 Last data filed at 05/20/2019 1312 Gross per 24 hour  Intake 371.03 ml  Output -  Net 371.03 ml    Physical Exam  Constitutional: She is oriented to person, place, and time and well-developed, well-nourished, and in no distress.  HENT:  Head: Normocephalic and atraumatic.  Mouth/Throat: Oropharynx is clear and moist. No oropharyngeal exudate.  Eyes: Pupils are equal, round, and reactive to light.  Neck: Normal range of motion. Neck supple.  Cardiovascular: Normal rate and regular rhythm.  Pulmonary/Chest: Effort normal and breath sounds normal. No respiratory distress. She has no wheezes.  Abdominal: Soft. Bowel sounds are normal. She exhibits no distension and no mass. There is no abdominal tenderness. There is no rebound and no guarding.  Musculoskeletal: Normal range of motion.        General: No tenderness or edema.     Comments: Left shoulder swelling noted.  Tenderness on movement.  Neurological: She is alert and oriented to person, place, and time.  Skin: Skin is warm.  Psychiatric: Affect normal.     Labs:  Lab Results  Component Value Date   WBC 4.6 05/20/2019   HGB 11.2 (L) 05/20/2019   HCT 34.1 (L) 05/20/2019   MCV 102.7 (H) 05/20/2019   PLT 150 05/20/2019   NEUTROABS 3.0 05/18/2019    Lab Results  Component Value Date   NA 134 (L) 05/20/2019   K 3.8 05/20/2019   CL 95 (L) 05/20/2019   CO2 28 05/20/2019    Studies:  Ct Head W Or Wo Contrast  Result Date: 05/18/2019 CLINICAL DATA:  53 year old  female with history of metastatic cervical cancer. Severe pain radiating from the skull base through the neck and left arm. EXAM: CT HEAD WITHOUT AND WITH CONTRAST CT NECK WITH CONTRAST TECHNIQUE: Contiguous axial images were obtained from the base of the skull through the vertex without and with intravenous contrast. Multidetector CT imaging of the and neck was performed using the standard protocol following the bolus administration of intravenous contrast. CONTRAST:  52m OMNIPAQUE IOHEXOL 300 MG/ML  SOLN COMPARISON:  Head CT without contrast 01/26/2019. Chest CT today reported separately. FINDINGS: CT HEAD FINDINGS Brain: Stable and normal cerebral volume. No midline shift, ventriculomegaly, mass effect, evidence of mass lesion, intracranial hemorrhage or evidence of cortically based acute infarction. Gray-white matter differentiation is within normal limits throughout the brain. No abnormal enhancement identified. Vascular: The major intracranial vascular structures are enhancing as expected. Skull: Stable, negative. Sinuses/Orbits: Visualized paranasal sinuses and mastoids are stable and well pneumatized. Other: Visualized orbits and scalp soft tissues are within normal limits. CT NECK FINDINGS Pharynx and larynx: Laryngeal and pharyngeal soft tissue contours are within normal limits. Negative parapharyngeal and retropharyngeal spaces. Salivary glands: Negative sublingual space. Submandibular and parotid glands appear symmetric and normal. Thyroid: Negative. Lymph nodes: Negative.  No cervical lymphadenopathy. Vascular: Right IJ approach porta cath. The major vascular structures in the neck and at the skull base are patent. Mastoids  and visualized paranasal sinuses: Clear. Skeleton: Absent dentition. Widespread advanced cervical disc and endplate degeneration. Upper cervical facet degeneration with mild degenerative appearing anterolisthesis of C3 on C4. Degenerative appearing bone mineralization heterogeneity  in the odontoid. No acute or suspicious osseous lesion identified at the skull base or neck. Upper chest: Reported separately today. IMPRESSION: 1. No metastatic disease identified in the head or neck. 2.  Normal CT appearance of the brain. 3. Negative neck CT aside from widespread cervical spine degeneration. 4.  CT Chest today reported separately. Electronically Signed   By: Genevie Ann M.D.   On: 05/18/2019 14:42   Ct Soft Tissue Neck W Contrast  Result Date: 05/18/2019 CLINICAL DATA:  53 year old female with history of metastatic cervical cancer. Severe pain radiating from the skull base through the neck and left arm. EXAM: CT HEAD WITHOUT AND WITH CONTRAST CT NECK WITH CONTRAST TECHNIQUE: Contiguous axial images were obtained from the base of the skull through the vertex without and with intravenous contrast. Multidetector CT imaging of the and neck was performed using the standard protocol following the bolus administration of intravenous contrast. CONTRAST:  97m OMNIPAQUE IOHEXOL 300 MG/ML  SOLN COMPARISON:  Head CT without contrast 01/26/2019. Chest CT today reported separately. FINDINGS: CT HEAD FINDINGS Brain: Stable and normal cerebral volume. No midline shift, ventriculomegaly, mass effect, evidence of mass lesion, intracranial hemorrhage or evidence of cortically based acute infarction. Gray-white matter differentiation is within normal limits throughout the brain. No abnormal enhancement identified. Vascular: The major intracranial vascular structures are enhancing as expected. Skull: Stable, negative. Sinuses/Orbits: Visualized paranasal sinuses and mastoids are stable and well pneumatized. Other: Visualized orbits and scalp soft tissues are within normal limits. CT NECK FINDINGS Pharynx and larynx: Laryngeal and pharyngeal soft tissue contours are within normal limits. Negative parapharyngeal and retropharyngeal spaces. Salivary glands: Negative sublingual space. Submandibular and parotid glands  appear symmetric and normal. Thyroid: Negative. Lymph nodes: Negative.  No cervical lymphadenopathy. Vascular: Right IJ approach porta cath. The major vascular structures in the neck and at the skull base are patent. Mastoids and visualized paranasal sinuses: Clear. Skeleton: Absent dentition. Widespread advanced cervical disc and endplate degeneration. Upper cervical facet degeneration with mild degenerative appearing anterolisthesis of C3 on C4. Degenerative appearing bone mineralization heterogeneity in the odontoid. No acute or suspicious osseous lesion identified at the skull base or neck. Upper chest: Reported separately today. IMPRESSION: 1. No metastatic disease identified in the head or neck. 2.  Normal CT appearance of the brain. 3. Negative neck CT aside from widespread cervical spine degeneration. 4.  CT Chest today reported separately. Electronically Signed   By: HGenevie AnnM.D.   On: 05/18/2019 14:42   Ct Chest W Contrast  Result Date: 05/18/2019 CLINICAL DATA:  Hx of cervical cancer with mets to the lungs. Severe pain from base of skull to chest and left arm. Concern for further mets. EXAM: CT CHEST WITH CONTRAST TECHNIQUE: Multidetector CT imaging of the chest was performed during intravenous contrast administration. CONTRAST:  760mOMNIPAQUE IOHEXOL 300 MG/ML  SOLN COMPARISON:  04/21/2019 FINDINGS: Cardiovascular: Heart is normal in size and configuration. No pericardial effusion. Great vessels are normal in caliber. Minor aortic atherosclerosis. No dissection. Arch branch vessels are widely patent. Mediastinum/Nodes: Visualized thyroid gland is unremarkable. No neck base or axillary masses or enlarged lymph nodes. No mediastinal or hilar masses or adenopathy. Trachea and esophagus are unremarkable. Right internal jugular Port-A-Cath tip projects in the right atrium. Lungs/Pleura: Numerous bilateral metastatic pulmonary nodules  with an increase in number and size compared to the prior CT. Largest  nodule lies at the base of the right lower lobe, image 37, series 9, 14 mm, previously 12 mm. Nodule in the posteromedial base of the left lower lobe, image 44, measures 10 mm, previously 7 mm. Multiple nodules shows central cavitation. No evidence of pneumonia or pulmonary edema. No pleural effusion or pneumothorax. Upper Abdomen: No acute abnormality. Musculoskeletal: No fracture or acute finding. No osteoblastic or osteolytic lesions. IMPRESSION: 1. No acute findings in the chest. 2. Progression of metastatic disease to the lungs with an increase in the size and number of metastatic lung nodules. No other evidence of metastatic disease in the chest. Aortic Atherosclerosis (ICD10-I70.0). Electronically Signed   By: Lajean Manes M.D.   On: 05/18/2019 14:47   Mr Cervical Spine Wo Contrast  Result Date: 05/18/2019 CLINICAL DATA:  LEFT neck and shoulder pain. Pain is severe, for 3 days. Unable to lift LEFT arm. EXAM: MRI CERVICAL SPINE WITHOUT CONTRAST TECHNIQUE: Multiplanar, multisequence MR imaging of the cervical spine was performed. No intravenous contrast was administered. COMPARISON:  CT of the neck earlier today. FINDINGS: The patient was unable to remain motionless for the exam. Small or subtle lesions could be overlooked. Alignment: Straightening, and slight reversal of the normal cervical lordosis. Trace anterolisthesis C2-3, C3-4, and C4-5 are facet mediated. Vertebrae: Endplate reactive changes, C3 through C7. No visible metastatic disease. Cord: Limited assessment, no definite impingement or abnormal signal. Posterior Fossa, vertebral arteries, paraspinal tissues: No tonsillar herniation. Vertebral flow voids are maintained. No neck masses. Disc levels: C2-3: Mild degenerative anterolisthesis. Annular bulge. No impingement. C3-4: Disc space narrowing. 2 mm degenerative anterolisthesis. Bulky RIGHT facet arthrosis. BILATERAL RIGHT greater than LEFT C4 foraminal narrowing. C4-5: Disc space narrowing  with osseous spurring. 1-2 mm anterolisthesis. Bulky RIGHT facet arthropathy. RIGHT greater than LEFT C5 foraminal narrowing. C5-6: Disc space narrowing with osseous spurring. Mild stenosis. Shallow central protrusion. BILATERAL C6 foraminal narrowing due to uncinate spurring. C6-7: Disc space narrowing. Osseous spurring. Shallow central protrusion. Effacement anterior subarachnoid space. Borderline BILATERAL C7 foraminal narrowing. C7-T1: Unremarkable. IMPRESSION: Multilevel spondylosis as described. BILATERAL foraminal narrowing at C5-6 and C6-7 could affect the RIGHT or LEFT exiting nerve roots at those levels, but there is no clearly asymmetric or predominant LEFT-sided compressive lesion. Within limits for detection on this motion degraded exam, no metastatic disease, epidural hematoma, significant spinal stenosis, or dominant/lateralizing disc protrusion. Electronically Signed   By: Staci Righter M.D.   On: 05/18/2019 21:25   Dg Shoulder Left  Result Date: 05/19/2019 CLINICAL DATA:  Left shoulder pain.  No known injury. EXAM: LEFT SHOULDER - 2+ VIEW COMPARISON:  Chest CT 05/18/2019 FINDINGS: No fracture or dislocation is identified. No obvious bone lesions. Small calcifications are noted near the acromion which could be unfused ossification centers or insertional deltoid ligament calcific tendinopathy. Diffuse pulmonary metastatic disease is noted. IMPRESSION: No acute bony findings or obvious metastatic bone lesions. Numerous pulmonary metastatic lesions. Electronically Signed   By: Marijo Sanes M.D.   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  Status post cycle #1.  May 18, 2019 CT scan mild progression of the lung nodules noted  however-too early to assess response from immunotherapy.    #Left  shoulder swelling-status post aspiration-acute neutrophilic infiltrate noted.  Question infection versus others.  Defer to primary team orthopedics regarding antibiotics.  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   # adrenal insuffiency- on maintenance steroids- stable.   #Discussed with Dr. Brett Albino.    Cammie Sickle, MD 05/20/2019  1:50 PM

## 2019-05-20 NOTE — Progress Notes (Signed)
Subjective:    Patient shoulder is still sore.  Some swelling recurred.  Awaiting culture report.  Uric acid normal.  Shoulder aspirate showed 25,000 white blood cell count.  Patient reports pain as moderate.  Objective:   VITALS:   Vitals:   05/19/19 2049 05/20/19 0345  BP: (!) 143/94 124/79  Pulse: 96 94  Resp: 18 18  Temp: 99 F (37.2 C) 98.6 F (37 C)  SpO2: 95% 98%    Neurologically intact ABD soft Neurovascular intact Sensation intact distally Intact pulses distally  LABS Recent Labs    05/18/19 0935 05/20/19 0544  HGB 12.3 11.2*  HCT 37.0 34.1*  WBC 4.1 4.6  PLT 168 150    Recent Labs    05/18/19 0935 05/20/19 0544  NA 136 134*  K 3.6 3.8  BUN 11 10  CREATININE 0.69 0.64  GLUCOSE 101* 111*    No results for input(s): LABPT, INR in the last 72 hours.   Assessment/Plan:    If fluid shows infection we will do an I&D tomorrow. Otherwise may put a cortisone shot in if no infection.

## 2019-05-21 LAB — BASIC METABOLIC PANEL
Anion gap: 9 (ref 5–15)
BUN: 10 mg/dL (ref 6–20)
CO2: 26 mmol/L (ref 22–32)
Calcium: 9 mg/dL (ref 8.9–10.3)
Chloride: 99 mmol/L (ref 98–111)
Creatinine, Ser: 0.67 mg/dL (ref 0.44–1.00)
GFR calc Af Amer: >60 mL/min (ref >60)
GFR calc non Af Amer: >60 mL/min (ref >60)
Glucose, Bld: 99 mg/dL (ref 70–99)
Potassium: 4 mmol/L (ref 3.5–5.1)
Sodium: 134 mmol/L — ABNORMAL LOW (ref 135–145)

## 2019-05-21 LAB — URINALYSIS, COMPLETE (UACMP) WITH MICROSCOPIC
Bacteria, UA: NONE SEEN
Bilirubin Urine: NEGATIVE
Glucose, UA: NEGATIVE mg/dL
Hgb urine dipstick: NEGATIVE
Ketones, ur: NEGATIVE mg/dL
Leukocytes,Ua: NEGATIVE
Nitrite: NEGATIVE
Protein, ur: NEGATIVE mg/dL
Specific Gravity, Urine: 1.011 (ref 1.005–1.030)
pH: 6 (ref 5.0–8.0)

## 2019-05-21 LAB — CBC
HCT: 34.9 % — ABNORMAL LOW (ref 36.0–46.0)
Hemoglobin: 11.3 g/dL — ABNORMAL LOW (ref 12.0–15.0)
MCH: 33.4 pg (ref 26.0–34.0)
MCHC: 32.4 g/dL (ref 30.0–36.0)
MCV: 103.3 fL — ABNORMAL HIGH (ref 80.0–100.0)
Platelets: 162 10*3/uL (ref 150–400)
RBC: 3.38 MIL/uL — ABNORMAL LOW (ref 3.87–5.11)
RDW: 13 % (ref 11.5–15.5)
WBC: 3.8 10*3/uL — ABNORMAL LOW (ref 4.0–10.5)
nRBC: 0 % (ref 0.0–0.2)

## 2019-05-21 LAB — PROCALCITONIN: Procalcitonin: <0.1 ng/mL

## 2019-05-21 NOTE — Progress Notes (Signed)
[facility] [place] NAME: Joanna Hall    MR#:  [id number]  DATE OF BIRTH:  [birth year]  SUBJECTIVE:   Still has significant left shoulder pain, swelling.  No fever, no shortness of breath  REVIEW OF SYSTEMS:  Review of Systems  Constitutional: Negative for chills and fever.  HENT: Negative for congestion and sore throat.   Eyes: Negative for blurred vision and double vision.  Respiratory: Negative for cough and shortness of breath.   Cardiovascular: Positive for chest pain. Negative for palpitations.  Gastrointestinal: Negative for nausea and vomiting.  Genitourinary: Negative for dysuria and urgency.  Musculoskeletal: Positive for joint pain and neck pain. Negative for back pain.  Neurological: Negative for dizziness and headaches.  Psychiatric/Behavioral: Negative for depression. The patient is not nervous/anxious.     DRUG ALLERGIES:   Allergies  Allergen Reactions  . Ketamine Anxiety and Other (See Comments)    Syncope episode/confusion    VITALS:  Blood pressure 107/74, pulse 91, temperature 98.7 F (37.1 C), resp. rate 18, height 5' 8"  (1.727 m), weight 71.1 kg, SpO2 95 %. PHYSICAL EXAMINATION:  Physical Exam  GENERAL:  Laying in the bed, in NAD. Chronically ill-appearing. HEENT: Head atraumatic, normocephalic. Pupils equal, round, reactive to light and accommodation. No scleral icterus. Extraocular muscles intact. Oropharynx and nasopharynx clear.  NECK:  Supple, no jugular venous distention. No thyroid enlargement. LUNGS: Lungs are clear to auscultation bilaterally. No wheezes, crackles, rhonchi. No use of accessory muscles of respiration.  CARDIOVASCULAR: RRR, S1, S2 normal. No murmurs, rubs, or gallops.  ABDOMEN: Soft, nontender, nondistended. Bowel sounds present.  EXTREMITIES: No pedal edema, cyanosis, or clubbing. + Left shoulder swelling.  Very tender to touch.  No erythema or warmth.  Unable to assess range  of motion due to pain. NEUROLOGIC: CN 2-12 intact, no focal deficits. No focal deficits. Sensation intact throughout. Gait not checked.  PSYCHIATRIC: The patient is alert and oriented x 3.  SKIN: No obvious rash, lesion, or ulcer.  LABORATORY PANEL:  Female CBC Recent Labs  Lab 05/21/19 0539  WBC 3.8*  HGB 11.3*  HCT 34.9*  PLT 162   ------------------------------------------------------------------------------------------------------------------ Chemistries  Recent Labs  Lab 05/15/19 0822 05/18/19 0935  05/21/19 0539  NA 138 136   < > 134*  K 3.6 3.6   < > 4.0  CL 106 101   < > 99  CO2 25 25   < > 26  GLUCOSE 105* 101*   < > 99  BUN 16 11   < > 10  CREATININE 0.73 0.69   < > 0.67  CALCIUM 9.2 9.0   < > 9.0  MG 1.6*  --   --   --   AST 32 37  --   --   ALT 32 51*  --   --   ALKPHOS 139* 181*  --   --   BILITOT 0.5 0.7  --   --    < > = values in this interval not displayed.   RADIOLOGY:  No results found. ASSESSMENT AND PLAN:   Intractable left shoulder pain and swelling- concern for possible septic arthritis. Patient is afebrile and has a normal WBC count, but she may not develop leukocytosis because of her chemo. -MRI C-spine with multilevel spondylosis, bilateral foraminal narrowing at C5-6 and C6-7, but no clear compressive lesions or bony mets -s/p joint aspiration 8/3 by Dr. Sabra Heck- fluid analysis showed 25,000 WBC with 92% neutrophils -  Cultures are negative for 48 hours, patient will get intra-articular steroid injection, discussed with orthopedic today. Continue IV antibiotics for today, check procalcitonin, like to discontinue them tomorrow.  Chest pain- likely musculoskeletal in etiology.  PE unlikely, as patient is on Xarelto. -CT chest with progression of metastatic disease to the lungs -Troponin negative x 4, EKG without signs of ischemia -Pain control, as above  Metastatic cervical cancer-CT chest showed progression of metastatic disease.  Follows with  Dr. Lynett Fish as an outpatient. -Oncology following patient understands that her prognosis is poor.  Hypertension-likely secondary to pain.  BP has improved. -IV hydralazine PRN  COPD- stable, no signs of acute exacerbation -Continue PRN duo nebs  GERD- stable -Continue Protonix  Factor V Leiden -Continue home xarelto  All the records are reviewed and case discussed with Care Management/Social Worker. Management plans discussed with the patient, family and they are in agreement.  CODE STATUS: Full Code  TOTAL TIME TAKING CARE OF THIS PATIENT: 40 minutes.   More than 50% of the time was spent in counseling/coordination of care: YES  POSSIBLE D/C IN 1-2 DAYS, DEPENDING ON CLINICAL CONDITION.   Epifanio Lesches M.D on 05/21/2019 at 2:16 PM  Between 7am to 6pm - Pager - [phone]  After 6pm go to [email or web address] - Proofreader  [facility] [place] [facility]  Office  [phone]  CC: Primary care physician; [place]  Note: This dictation was prepared with Dragon dictation along with smaller phrase technology. Any transcriptional errors that result from this process are unintentional.

## 2019-05-21 NOTE — Progress Notes (Signed)
Subjective:     Left shoulder still quite sore.  Cultures negative at 48 hrs.  Will inject with Kenalog today Remains afebrile. Labs normal.   Patient reports pain as moderate.  Objective:   VITALS:   Vitals:   05/20/19 2020 05/21/19 0554  BP: 94/76 107/74  Pulse: (!) 103 91  Resp:    Temp:  98.7 F (37.1 C)  SpO2: 99% 95%    Neurologically intact ABD soft Neurovascular intact Sensation intact distally Intact pulses distally No cellulitis present Less swelling in shoulder.  Still painful ROM.    LABS Recent Labs    05/20/19 0544 05/21/19 0539  HGB 11.2* 11.3*  HCT 34.1* 34.9*  WBC 4.6 3.8*  PLT 150 162    Recent Labs    05/20/19 0544 05/21/19 0539  NA 134* 134*  K 3.8 4.0  BUN 10 10  CREATININE 0.64 0.67  GLUCOSE 111* 99    No results for input(s): LABPT, INR in the last 72 hours.   Assessment/Plan:     I injected the left shoulder with xylocaine, marcaine, and Kenalog.   Continue ice Colchicine 0/82m  Bid Sling

## 2019-05-22 ENCOUNTER — Inpatient Hospital Stay: Payer: Medicaid Other

## 2019-05-22 ENCOUNTER — Telehealth: Payer: Self-pay | Admitting: *Deleted

## 2019-05-22 LAB — PROCALCITONIN: Procalcitonin: <0.1 ng/mL

## 2019-05-22 MED ORDER — DOXYCYCLINE HYCLATE 100 MG PO TABS: 100.0000 mg | ORAL_TABLET | Freq: Two times a day (BID) | ORAL | 0 refills | Status: DC

## 2019-05-22 MED ORDER — SODIUM CHLORIDE 0.9% FLUSH
10.0000 mL | INTRAVENOUS | Status: AC | PRN
  Administered 2019-05-22: 10 mL

## 2019-05-22 MED ORDER — CYCLOBENZAPRINE HCL 10 MG PO TABS: 10.0000 mg | ORAL_TABLET | Freq: Three times a day (TID) | ORAL | 0 refills | Status: DC

## 2019-05-22 MED ORDER — POTASSIUM CHLORIDE CRYS ER 20 MEQ PO TBCR: 20.0000 meq | EXTENDED_RELEASE_TABLET | Freq: Two times a day (BID) | ORAL | 0 refills | Status: DC

## 2019-05-22 MED ORDER — DOXYCYCLINE HYCLATE 100 MG PO TABS
100.0000 mg | ORAL_TABLET | Freq: Two times a day (BID) | ORAL | Status: DC
  Administered 2019-05-22: 100 mg via ORAL
  Filled 2019-05-22: qty 1

## 2019-05-22 MED ORDER — ENSURE ENLIVE PO LIQD: 237.0000 mL | Freq: Three times a day (TID) | ORAL | 12 refills | Status: DC

## 2019-05-22 MED ORDER — HEPARIN SOD (PORK) LOCK FLUSH 100 UNIT/ML IV SOLN
500.0000 [IU] | INTRAVENOUS | Status: AC | PRN
  Administered 2019-05-22: 12:00:00 500 [IU]
  Filled 2019-05-22: qty 5

## 2019-05-22 NOTE — Telephone Encounter (Signed)
Received fax from [facility] to refill patient Oxycodone 15 mg ER. This medicine was last refill on 04/25/19 and she was hospitalized for 4 days discharged today. I called pharmacy who states patient came by after discharge and requested them to send  Refill request for this medicine for her. She should have a weeks worth of medicine on hand. Do you want to send refill now? Please advise

## 2019-05-22 NOTE — Telephone Encounter (Signed)
Joanna Hall- Hold off sending the pain prescription as she is still in the hospital.

## 2019-05-22 NOTE — Progress Notes (Signed)
MD order received in [facility] to discharge pt home today; verbally AVS with pt, Rxs escribed to [place]; no questions voiced at this time; pt discharged via wheelchair by nursing to the ED entrance in order for th pt to driver herself home

## 2019-05-22 NOTE — Discharge Summary (Signed)
Joanna Hall, is a 53 y.o. female  DOB [birth year]  MRN [id number].  Admission date:  05/18/2019  Admitting Physician  Christel Mormon, MD  Discharge Date:  05/22/2019   Primary MD  [place]  Recommendations for primary care physician for things to follow:  Follow-up with PCP in 1 week Follow-up with Dr. Earnestine Leys in 1 week.   Admission Diagnosis  Neck pain [M54.2] Left arm pain [M79.602] Chest pain, unspecified type [R07.9]   Discharge Diagnosis  Neck pain [M54.2] Left arm pain [M79.602] Chest pain, unspecified type [R07.9]   Active Problems:   Malignant neoplasm of overlapping sites of cervix (HCC)   Chest pain in adult   Intractable pain      Past Medical History:  Diagnosis Date  . Abdominal pain 06/10/2018  . Abnormal cervical Papanicolaou smear 09/18/2017  . Anxiety   . Aortic atherosclerosis ([place])   . Arthritis    neck and knees  . 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 Blood transfusion without reported diagnosis   . Cervical cancer ([place]) 09/2016   mets lung  . Chronic anal fissure   . Chronic diarrhea   . Dyspnea   . Erosive gastropathy 09/18/2017  . Factor V Leiden mutation ([place])   . Fecal incontinence   . Genital warts   . GERD (gastroesophageal reflux disease)   . Heart murmur   . Hematochezia   . Hemorrhoids   . 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 Hepatitis, chronic ([place]) 05/05/2017  . History of cancer chemotherapy    completed 06/2017  . History of Clostridium difficile infection    while undergoing chemo.  Negative test 10/2017  . Ileocolic anastomotic leak   . 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 Intestinal infection due to Clostridium difficile 09/18/2017  . Macrocytic anemia with vitamin B12  deficiency   . Multiple gastric ulcers   . Nausea vomiting and diarrhea   . Pancolitis ([place]) 07/27/2018  . Perianal condylomata   . Pneumonia    History of  . Pulmonary nodules   . Rectal bleeding   . Small bowel obstruction ([place]) 08/2017  . Stiff neck    limited right turn  . Vitamin D deficiency     Past Surgical History:  Procedure Laterality Date  . CHOLECYSTECTOMY    . COLON SURGERY  08/2017   resection  . COLONOSCOPY WITH PROPOFOL N/A 12/20/2017   Procedure: COLONOSCOPY WITH PROPOFOL;  Surgeon: [name], MD;  Location: [facility] ENDOSCOPY;  Service: Gastroenterology;  Laterality: N/A;  . COLONOSCOPY WITH PROPOFOL N/A 07/30/2018   Procedure: COLONOSCOPY WITH PROPOFOL;  Surgeon: [name], MD;  Location: [facility] ENDOSCOPY;  Service: Gastroenterology;  Laterality: N/A;  . COLONOSCOPY WITH PROPOFOL N/A 10/10/2018   Procedure: COLONOSCOPY WITH PROPOFOL;  Surgeon: Lucilla Lame, MD;  Location: [facility] ENDOSCOPY;  Service: Endoscopy;  Laterality: N/A;  . DIAGNOSTIC LAPAROSCOPY    . ESOPHAGOGASTRODUODENOSCOPY (EGD) WITH PROPOFOL N/A 12/20/2017   Procedure: ESOPHAGOGASTRODUODENOSCOPY (EGD) WITH PROPOFOL;  Surgeon: [name], MD;  Location: [place];  Service: Gastroenterology;  Laterality: N/A;  . ESOPHAGOGASTRODUODENOSCOPY (EGD) WITH PROPOFOL  07/30/2018   Procedure: ESOPHAGOGASTRODUODENOSCOPY (EGD) WITH PROPOFOL;  Surgeon: [name], MD;  Location: [facility] ENDOSCOPY;  Service: Gastroenterology;;  . Otho Darner SIGMOIDOSCOPY N/A 11/21/2018   Procedure: FLEXIBLE SIGMOIDOSCOPY;  Surgeon: [name],  MD;  Location: [facility] ENDOSCOPY;  Service: Gastroenterology;  Laterality: N/A;  . LAPAROTOMY N/A 08/31/2017   Procedure: EXPLORATORY LAPAROTOMY for SBO, ileocolectomy, removal of piece of uterine wall;  Surgeon: [name], MD;  Location: [facility] ORS;  Service: General;  Laterality: N/A;  . LASER ABLATION CONDOLAMATA N/A 02/22/2018   Procedure: LASER  ABLATION/REMOVAL OF SKAJGOTLXBW AROUND ANUS AND VAGINA;  Surgeon: Michael Boston, MD;  Location: [place];  Service: General;  Laterality: N/A;  . OOPHORECTOMY    . PORTA CATH INSERTION N/A 05/13/2018   Procedure: PORTA CATH INSERTION;  Surgeon: Katha Cabal, MD;  Location: [place] CV LAB;  Service: Cardiovascular;  Laterality: N/A;  . SMALL INTESTINE SURGERY    . TANDEM RING INSERTION     x3  . THORACOTOMY    . 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  Patient admitted for the same.  Hospital Course  Severe left shoulder pain, admitted to medical unit, started on IV pain medicines, also had IV antibiotics, Patient had intractable left shoulder pain with swelling, concern for septic arthritis, patient had arthrocentesis which showed fluid cultures are negative, patient had joint aspiration on August 3 by Dr. Earnestine Leys from orthopedic, Gram stain did not show any organisms, patient started on broad-spectrum antibiotics, patient received intraocular steroid injection yesterday by orthopedic, patient feels better today with less pain, wanted to go home.  Patient is advised to continue sling, ice as tolerated, .  No history of gout, uric acid is normal, colchicine discontinued at discharge but patient received colchicine as a trial in the  hospital.  But did not get help with shoulder pain.  Patient's procalcitonin is normal, WBC normal, blood cultures, synovial fluid cultures are negative. 2.  Metastatic cervical cancer, CT chest showed progression of metastatic disease, patient follows up with Dr. Oneita Jolly as an outpatient. 3.  Essential hypertension 4.  COPD without any exacerbation, CO2 COVID-19 test is negative. 4..  GERD: Continue PPIs. 5..  Factor V Leiden deficiency,; continue Xarelto.  #6 left shoulder swelling, pain, patient improved after steroid injection, can take chronic narcotics that she takes for cancer pain, for cervical cancer patient is on Keytruda.  Discharge Condition: Stable  follow UP  [place]. Go on 05/30/2019.   Specialty: Family Medicine Why: @ 3:20pm Contact information: [place] [address] [phone]        Earnestine Leys, MD. Schedule an appointment as soon as possible for a visit in 1 week.   Specialty: Orthopedic Surgery Why: Spoke to [facility] she stated that the office will be in touch with patient to schedule appt Contact information: [place] [address] [phone]             Discharge Instructions  and  Discharge  Medications      Allergies as of 05/22/2019      Reactions   Ketamine Anxiety, Other (See Comments)   Syncope episode/confusion      Medication List    STOP taking these medications   Viberzi 100 MG Tabs Generic drug: Eluxadoline     TAKE these medications   amitriptyline 75 MG tablet Commonly known as: ELAVIL Take 1 tablet (75 mg total) by mouth at bedtime.   Calcium 500 +D 500-400 MG-UNIT Tabs Generic drug: Calcium Carb-Cholecalciferol Take 2 tablets by mouth daily.   diphenoxylate-atropine 2.5-0.025 MG tablet Commonly known as: LOMOTIL TAKE (2) TABLETS FOUR TIMES DAILY.   doxycycline 100 MG tablet Commonly known as: VIBRA-TABS Take  1 tablet (100 mg total) by mouth every 12 (twelve) hours.   famotidine 20 MG tablet Commonly known as: PEPCID Take 1 tablet (20 mg total) by mouth 2 (two) times daily as needed for heartburn or indigestion.   feeding supplement (ENSURE ENLIVE) Liqd Take 237 mLs by mouth 3 (three) times daily between meals.   hydrOXYzine 10 MG tablet Commonly known as: ATARAX/VISTARIL Take 1 tablet (10 mg total) by mouth 3 (three) times daily as needed.   hyoscyamine 0.375 MG 12 hr tablet Commonly known as: LEVBID Take 1 tablet (0.375 mg total)by mouth 2 (two) times daily.   meclizine 25 MG tablet Commonly known as: ANTIVERT Take 1 tablet (25 mg total) by mouth 2 (two) times daily as needed for dizziness.   multivitamin with minerals tablet Take 1 tablet by mouth daily.   ondansetron 4 MG disintegrating tablet Commonly known as: Zofran ODT Take 1 tablet (4 mg total) by mouth every 8 (eight) hours as needed for nausea or vomiting.   oxyCODONE 15 mg 12 hr tablet Commonly known as: OXYCONTIN Take 1 tablet (15 mg total) by mouth every 12 (twelve) hours.   Oxycodone HCl 10 MG Tabs Take 1 tablet (10 mg total) by mouth every 6 (six) hours as needed (for breakthrough pain).   pantoprazole 40 MG tablet Commonly known as: PROTONIX Take 1 tablet (40 mg total) by mouth daily.   potassium chloride SA 20 MEQ tablet Commonly known as: K-DUR Take 1 tablet (20 mEq total) by mouth 2 (two) times daily.   promethazine 25 MG tablet Commonly known as: PHENERGAN Take 1 tablet (25 mg total) by mouth every 8 (eight) hours as needed for nausea or vomiting.   rivaroxaban 20 MG Tabs tablet Commonly known as: XARELTO Take 1 tablet (20 mg total) by mouth daily with supper. What changed: additional instructions   Ventolin HFA 108 (90 Base) MCG/ACT inhaler Generic drug: albuterol Inhale 1-2 puffs into the lungs every 4 (four) hours as needed for shortness of breath.         Diet and Activity  recommendation: See Discharge Instructions above   Consults obtained ['oncology, orthopedic. Major procedures and Radiology Reports - PLEASE review detailed and final reports for all details, in brief -       Ct Head W Or Wo Contrast  Result Date: 05/18/2019 CLINICAL DATA:  53 year old female with history of metastatic cervical cancer. Severe pain radiating from the skull base through the neck and left arm. EXAM: CT HEAD WITHOUT AND WITH CONTRAST CT NECK WITH CONTRAST TECHNIQUE: Contiguous axial images were obtained from the base of the skull through the vertex without and with intravenous contrast. Multidetector CT imaging of the and neck was performed using the standard protocol following the bolus administration of intravenous contrast.  CONTRAST:  54m OMNIPAQUE IOHEXOL 300 MG/ML  SOLN COMPARISON:  Head CT without contrast 01/26/2019. Chest CT today reported separately. FINDINGS: CT HEAD FINDINGS Brain: Stable and normal cerebral volume. No midline shift, ventriculomegaly, mass effect, evidence of mass lesion, intracranial hemorrhage or evidence of cortically based acute infarction. Gray-white matter differentiation is within normal limits throughout the brain. No abnormal enhancement identified. Vascular: The major intracranial vascular structures are enhancing as expected. Skull: Stable, negative. Sinuses/Orbits: Visualized paranasal sinuses and mastoids are stable and well pneumatized. Other: Visualized orbits and scalp soft tissues are within normal limits. CT NECK FINDINGS Pharynx and larynx: Laryngeal and pharyngeal soft tissue contours are within normal limits. Negative parapharyngeal and retropharyngeal spaces. Salivary glands: Negative sublingual space. Submandibular and parotid glands appear symmetric and normal. Thyroid: Negative. Lymph nodes: Negative.  No cervical lymphadenopathy. Vascular: Right IJ approach porta cath. The major vascular structures in the neck and at the skull base are  patent. Mastoids and visualized paranasal sinuses: Clear. Skeleton: Absent dentition. Widespread advanced cervical disc and endplate degeneration. Upper cervical facet degeneration with mild degenerative appearing anterolisthesis of C3 on C4. Degenerative appearing bone mineralization heterogeneity in the odontoid. No acute or suspicious osseous lesion identified at the skull base or neck. Upper chest: Reported separately today. IMPRESSION: 1. No metastatic disease identified in the head or neck. 2.  Normal CT appearance of the brain. 3. Negative neck CT aside from widespread cervical spine degeneration. 4.  CT Chest today reported separately. Electronically Signed   By: HGenevie AnnM.D.   On: 05/18/2019 14:42   Ct Soft Tissue Neck W Contrast  Result Date: 05/18/2019 CLINICAL DATA:  53year old female with history of metastatic cervical cancer. Severe pain radiating from the skull base through the neck and left arm. EXAM: CT HEAD WITHOUT AND WITH CONTRAST CT NECK WITH CONTRAST TECHNIQUE: Contiguous axial images were obtained from the base of the skull through the vertex without and with intravenous contrast. Multidetector CT imaging of the and neck was performed using the standard protocol following the bolus administration of intravenous contrast. CONTRAST:  780mOMNIPAQUE IOHEXOL 300 MG/ML  SOLN COMPARISON:  Head CT without contrast 01/26/2019. Chest CT today reported separately. FINDINGS: CT HEAD FINDINGS Brain: Stable and normal cerebral volume. No midline shift, ventriculomegaly, mass effect, evidence of mass lesion, intracranial hemorrhage or evidence of cortically based acute infarction. Gray-white matter differentiation is within normal limits throughout the brain. No abnormal enhancement identified. Vascular: The major intracranial vascular structures are enhancing as expected. Skull: Stable, negative. Sinuses/Orbits: Visualized paranasal sinuses and mastoids are stable and well pneumatized. Other:  Visualized orbits and scalp soft tissues are within normal limits. CT NECK FINDINGS Pharynx and larynx: Laryngeal and pharyngeal soft tissue contours are within normal limits. Negative parapharyngeal and retropharyngeal spaces. Salivary glands: Negative sublingual space. Submandibular and parotid glands appear symmetric and normal. Thyroid: Negative. Lymph nodes: Negative.  No cervical lymphadenopathy. Vascular: Right IJ approach porta cath. The major vascular structures in the neck and at the skull base are patent. Mastoids and visualized paranasal sinuses: Clear. Skeleton: Absent dentition. Widespread advanced cervical disc and endplate degeneration. Upper cervical facet degeneration with mild degenerative appearing anterolisthesis of C3 on C4. Degenerative appearing bone mineralization heterogeneity in the odontoid. No acute or suspicious osseous lesion identified at the skull base or neck. Upper chest: Reported separately today. IMPRESSION: 1. No metastatic disease identified in the head or neck. 2.  Normal CT appearance of the brain. 3. Negative neck CT aside from widespread cervical  spine degeneration. 4.  CT Chest today reported separately. Electronically Signed   By: Genevie Ann M.D.   On: 05/18/2019 14:42   Ct Chest W Contrast  Result Date: 05/18/2019 CLINICAL DATA:  Hx of cervical cancer with mets to the lungs. Severe pain from base of skull to chest and left arm. Concern for further mets. EXAM: CT CHEST WITH CONTRAST TECHNIQUE: Multidetector CT imaging of the chest was performed during intravenous contrast administration. CONTRAST:  73m OMNIPAQUE IOHEXOL 300 MG/ML  SOLN COMPARISON:  04/21/2019 FINDINGS: Cardiovascular: Heart is normal in size and configuration. No pericardial effusion. Great vessels are normal in caliber. Minor aortic atherosclerosis. No dissection. Arch branch vessels are widely patent. Mediastinum/Nodes: Visualized thyroid gland is unremarkable. No neck base or axillary masses or  enlarged lymph nodes. No mediastinal or hilar masses or adenopathy. Trachea and esophagus are unremarkable. Right internal jugular Port-A-Cath tip projects in the right atrium. Lungs/Pleura: Numerous bilateral metastatic pulmonary nodules with an increase in number and size compared to the prior CT. Largest nodule lies at the base of the right lower lobe, image 37, series 9, 14 mm, previously 12 mm. Nodule in the posteromedial base of the left lower lobe, image 44, measures 10 mm, previously 7 mm. Multiple nodules shows central cavitation. No evidence of pneumonia or pulmonary edema. No pleural effusion or pneumothorax. Upper Abdomen: No acute abnormality. Musculoskeletal: No fracture or acute finding. No osteoblastic or osteolytic lesions. IMPRESSION: 1. No acute findings in the chest. 2. Progression of metastatic disease to the lungs with an increase in the size and number of metastatic lung nodules. No other evidence of metastatic disease in the chest. Aortic Atherosclerosis (ICD10-I70.0). Electronically Signed   By: DLajean ManesM.D.   On: 05/18/2019 14:47   Mr Cervical Spine Wo Contrast  Result Date: 05/18/2019 CLINICAL DATA:  LEFT neck and shoulder pain. Pain is severe, for 3 days. Unable to lift LEFT arm. EXAM: MRI CERVICAL SPINE WITHOUT CONTRAST TECHNIQUE: Multiplanar, multisequence MR imaging of the cervical spine was performed. No intravenous contrast was administered. COMPARISON:  CT of the neck earlier today. FINDINGS: The patient was unable to remain motionless for the exam. Small or subtle lesions could be overlooked. Alignment: Straightening, and slight reversal of the normal cervical lordosis. Trace anterolisthesis C2-3, C3-4, and C4-5 are facet mediated. Vertebrae: Endplate reactive changes, C3 through C7. No visible metastatic disease. Cord: Limited assessment, no definite impingement or abnormal signal. Posterior Fossa, vertebral arteries, paraspinal tissues: No tonsillar herniation. Vertebral  flow voids are maintained. No neck masses. Disc levels: C2-3: Mild degenerative anterolisthesis. Annular bulge. No impingement. C3-4: Disc space narrowing. 2 mm degenerative anterolisthesis. Bulky RIGHT facet arthrosis. BILATERAL RIGHT greater than LEFT C4 foraminal narrowing. C4-5: Disc space narrowing with osseous spurring. 1-2 mm anterolisthesis. Bulky RIGHT facet arthropathy. RIGHT greater than LEFT C5 foraminal narrowing. C5-6: Disc space narrowing with osseous spurring. Mild stenosis. Shallow central protrusion. BILATERAL C6 foraminal narrowing due to uncinate spurring. C6-7: Disc space narrowing. Osseous spurring. Shallow central protrusion. Effacement anterior subarachnoid space. Borderline BILATERAL C7 foraminal narrowing. C7-T1: Unremarkable. IMPRESSION: Multilevel spondylosis as described. BILATERAL foraminal narrowing at C5-6 and C6-7 could affect the RIGHT or LEFT exiting nerve roots at those levels, but there is no clearly asymmetric or predominant LEFT-sided compressive lesion. Within limits for detection on this motion degraded exam, no metastatic disease, epidural hematoma, significant spinal stenosis, or dominant/lateralizing disc protrusion. Electronically Signed   By: JStaci RighterM.D.   On: 05/18/2019 21:25   [place]  Venous Img Upper Uni Left  Result Date: 05/18/2019 CLINICAL DATA:  Pain and swelling for 4 days. EXAM: LEFT UPPER EXTREMITY VENOUS DOPPLER ULTRASOUND TECHNIQUE: Gray-scale sonography with graded compression, as well as color Doppler and duplex ultrasound were performed to evaluate the upper extremity deep venous system from the level of the subclavian vein and including the jugular, axillary, basilic, radial, ulnar and upper cephalic vein. Spectral Doppler was utilized to evaluate flow at rest and with distal augmentation maneuvers. COMPARISON:  None. FINDINGS: Contralateral Subclavian Vein: Respiratory phasicity is normal and symmetric with the symptomatic side. No evidence of  thrombus. Normal compressibility. Internal Jugular Vein: No evidence of thrombus. Normal compressibility, respiratory phasicity and response to augmentation. Subclavian Vein: No evidence of thrombus. Normal compressibility, respiratory phasicity and response to augmentation. Axillary Vein: No evidence of thrombus. Normal compressibility, respiratory phasicity and response to augmentation. Cephalic Vein: No evidence of thrombus. Normal compressibility, respiratory phasicity and response to augmentation. Basilic Vein: No evidence of thrombus. Normal compressibility, respiratory phasicity and response to augmentation. Brachial Veins: No evidence of thrombus. Normal compressibility, respiratory phasicity and response to augmentation. Radial Veins: No evidence of thrombus. Normal compressibility, respiratory phasicity and response to augmentation. Ulnar Veins: No evidence of thrombus. Normal compressibility, respiratory phasicity and response to augmentation. Venous Reflux:  None visualized. Other Findings:  None visualized. IMPRESSION: No evidence of DVT within the left upper extremity. Electronically Signed   By: Lovey Newcomer M.D.   On: 05/18/2019 10:40   Dg Shoulder Left  Result Date: 05/19/2019 CLINICAL DATA:  Left shoulder pain.  No known injury. EXAM: LEFT SHOULDER - 2+ VIEW COMPARISON:  Chest CT 05/18/2019 FINDINGS: No fracture or dislocation is identified. No obvious bone lesions. Small calcifications are noted near the acromion which could be unfused ossification centers or insertional deltoid ligament calcific tendinopathy. Diffuse pulmonary metastatic disease is noted. IMPRESSION: No acute bony findings or obvious metastatic bone lesions. Numerous pulmonary metastatic lesions. Electronically Signed   By: Marijo Sanes M.D.   On: 05/19/2019 09:41    Micro Results     Recent Results (from the past 240 hour(s))  SARS CORONAVIRUS 2 Nasal Swab Aptima Multi Swab     Status: None   Collection Time:  05/19/19 12:59 AM   Specimen: Aptima Multi Swab; Nasal Swab  Result Value Ref Range Status   SARS Coronavirus 2 NEGATIVE NEGATIVE Final    Comment: (NOTE) SARS-CoV-2 target nucleic acids are NOT DETECTED. The SARS-CoV-2 RNA is generally detectable in upper and lower respiratory specimens during the acute phase of infection. Negative results do not preclude SARS-CoV-2 infection, do not rule out co-infections with other pathogens, and should not be used as the sole basis for treatment or other patient management decisions. Negative results must be combined with clinical observations, patient history, and epidemiological information. The expected result is Negative. Fact Sheet for Patients: SugarRoll.be Fact Sheet for Healthcare Providers: https://www.woods-mathews.com/ This test is not yet approved or cleared by the [place] FDA and  has been authorized for detection and/or diagnosis of SARS-CoV-2 by FDA under an Emergency Use Authorization (EUA). This EUA will remain  in effect (meaning this test can be used) for the duration of the COVID-19 declaration under Section 56 4(b)(1) of the Act, 21 U.S.C. section 360bbb-3(b)(1), unless the authorization is terminated or revoked sooner. Performed at [facility], [place] [address], [place], [place] [address]   Body fluid culture     Status: None (Preliminary result)   Collection Time: 05/19/19  6:55 PM   Specimen: Wound; Body Fluid  Result Value Ref Range Status   Specimen Description   Final    WOUND Performed at [facility], [address] [place], [place] [address]    Special Requests   Final    SHOULDER LEFT Performed at [facility], [address] [place], [place] [address]    Gram Stain   Final    RARE WBC PRESENT, PREDOMINANTLY PMN NO ORGANISMS SEEN    Culture   Final    NO GROWTH 3 DAYS Performed at [facility], [place] [address]  [place], [place] [address]    Report Status PENDING  Incomplete  CULTURE, BLOOD (ROUTINE X 2) w Reflex to ID Panel     Status: None (Preliminary result)   Collection Time: 05/20/19  8:30 AM   Specimen: BLOOD  Result Value Ref Range Status   Specimen Description BLOOD PORT  Final   Special Requests   Final    BOTTLES DRAWN AEROBIC AND ANAEROBIC Blood Culture results may not be optimal due to an excessive volume of blood received in culture bottles   Culture   Final    NO GROWTH 2 DAYS Performed at [facility], [address] [place], [place] [address]    Report Status PENDING  Incomplete  CULTURE, BLOOD (ROUTINE X 2) w Reflex to ID Panel     Status: None (Preliminary result)   Collection Time: 05/20/19  8:41 AM   Specimen: BLOOD  Result Value Ref Range Status   Specimen Description BLOOD BLOOD LEFT HAND  Final   Special Requests   Final    BOTTLES DRAWN AEROBIC AND ANAEROBIC Blood Culture results may not be optimal due to an excessive volume of blood received in culture bottles   Culture   Final    NO GROWTH 2 DAYS Performed at [place] [facility], [address] [place], [place] [address]    Report Status PENDING  Incomplete       Today   Subjective:   Rhilee Currin today, left shoulder pain improved after steroid injection, eager to go home  Objective:   Blood pressure (!) 128/92, pulse 87, temperature 97.9 F (36.6 C), temperature source Oral, resp. rate 18, height 5' 8"  (1.727 m), weight 71.1 kg, SpO2 99 %.   Intake/Output Summary (Last 24 hours) at 05/22/2019 1504 Last data filed at 05/22/2019 1004 Gross per 24 hour  Intake 726.19 ml  Output -  Net 726.19 ml    Exam Awake Alert, Oriented x 3, No new F.N deficits, Normal affect [place].AT,PERRAL Supple Neck,No JVD, No cervical lymphadenopathy appriciated.  Symmetrical Chest wall movement, Good air movement bilaterally, CTAB RRR,No Gallops,Rubs or new Murmurs, No Parasternal Heave +ve B.Sounds, Abd  Soft, Non tender, No organomegaly appriciated, No rebound -guarding or rigidity. No Cyanosis, Clubbing or edema, No new Rash or bruise  Data Review   CBC w Diff:  Lab Results  Component Value Date   WBC 3.8 (L) 05/21/2019   HGB 11.3 (L) 05/21/2019   HCT 34.9 (L) 05/21/2019   PLT 162 05/21/2019   LYMPHOPCT 16 05/18/2019   MONOPCT 6 05/18/2019   EOSPCT 2 05/18/2019   BASOPCT 0 05/18/2019    CMP:  Lab Results  Component Value Date   NA 134 (L) 05/21/2019   K 4.0 05/21/2019   CL 99 05/21/2019   CO2 26 05/21/2019   BUN 10 05/21/2019   CREATININE 0.67 05/21/2019   PROT 7.7  05/18/2019   ALBUMIN 3.8 05/18/2019   BILITOT 0.7 05/18/2019   ALKPHOS 181 (H) 05/18/2019   AST 37 05/18/2019   ALT 51 (H) 05/18/2019  .   Total Time in preparing paper work, data evaluation and todays exam - 64 minutes  Epifanio Lesches M.D on 05/22/2019 at 3:04 PM    Note: This dictation was prepared with Dragon dictation along with smaller phrase technology. Any transcriptional errors that result from this process are unintentional.

## 2019-05-23 ENCOUNTER — Other Ambulatory Visit: Payer: Self-pay

## 2019-05-23 ENCOUNTER — Encounter: Payer: Self-pay | Admitting: Emergency Medicine

## 2019-05-23 ENCOUNTER — Emergency Department: Payer: Medicaid Other

## 2019-05-23 ENCOUNTER — Telehealth: Payer: Self-pay | Admitting: *Deleted

## 2019-05-23 ENCOUNTER — Inpatient Hospital Stay: Payer: Medicaid Other

## 2019-05-23 ENCOUNTER — Inpatient Hospital Stay (HOSPITAL_BASED_OUTPATIENT_CLINIC_OR_DEPARTMENT_OTHER): Payer: Medicaid Other | Admitting: Oncology

## 2019-05-23 ENCOUNTER — Emergency Department
Admission: EM | Admit: 2019-05-23 | Discharge: 2019-05-23 | Disposition: A | Discharge status: Home / Self Care | Payer: Medicaid Other | Attending: Emergency Medicine | Admitting: Emergency Medicine

## 2019-05-23 DIAGNOSIS — E860 Dehydration: Secondary | ICD-10-CM

## 2019-05-23 DIAGNOSIS — Z79899 Other long term (current) drug therapy: Secondary | ICD-10-CM | POA: Insufficient documentation

## 2019-05-23 DIAGNOSIS — R0789 Other chest pain: Secondary | ICD-10-CM | POA: Insufficient documentation

## 2019-05-23 DIAGNOSIS — R1110 Vomiting, unspecified: Secondary | ICD-10-CM | POA: Diagnosis not present

## 2019-05-23 DIAGNOSIS — R079 Chest pain, unspecified: Secondary | ICD-10-CM

## 2019-05-23 DIAGNOSIS — C539 Malignant neoplasm of cervix uteri, unspecified: Secondary | ICD-10-CM | POA: Diagnosis not present

## 2019-05-23 DIAGNOSIS — R0600 Dyspnea, unspecified: Secondary | ICD-10-CM | POA: Insufficient documentation

## 2019-05-23 DIAGNOSIS — M25512 Pain in left shoulder: Secondary | ICD-10-CM | POA: Insufficient documentation

## 2019-05-23 DIAGNOSIS — I10 Essential (primary) hypertension: Secondary | ICD-10-CM | POA: Insufficient documentation

## 2019-05-23 DIAGNOSIS — C538 Malignant neoplasm of overlapping sites of cervix uteri: Secondary | ICD-10-CM

## 2019-05-23 DIAGNOSIS — Z87891 Personal history of nicotine dependence: Secondary | ICD-10-CM | POA: Diagnosis not present

## 2019-05-23 DIAGNOSIS — C7800 Secondary malignant neoplasm of unspecified lung: Secondary | ICD-10-CM | POA: Insufficient documentation

## 2019-05-23 DIAGNOSIS — Z96652 Presence of left artificial knee joint: Secondary | ICD-10-CM | POA: Diagnosis not present

## 2019-05-23 DIAGNOSIS — Z7901 Long term (current) use of anticoagulants: Secondary | ICD-10-CM | POA: Insufficient documentation

## 2019-05-23 LAB — COMPREHENSIVE METABOLIC PANEL
ALT: 65 U/L — ABNORMAL HIGH (ref 0–44)
AST: 52 U/L — ABNORMAL HIGH (ref 15–41)
Albumin: 3.8 g/dL (ref 3.5–5.0)
Alkaline Phosphatase: 237 U/L — ABNORMAL HIGH (ref 38–126)
Anion gap: 9 (ref 5–15)
BUN: 14 mg/dL (ref 6–20)
CO2: 28 mmol/L (ref 22–32)
Calcium: 9.9 mg/dL (ref 8.9–10.3)
Chloride: 101 mmol/L (ref 98–111)
Creatinine, Ser: 0.7 mg/dL (ref 0.44–1.00)
GFR calc Af Amer: >60 mL/min (ref >60)
GFR calc non Af Amer: >60 mL/min (ref >60)
Glucose, Bld: 95 mg/dL (ref 70–99)
Potassium: 3.8 mmol/L (ref 3.5–5.1)
Sodium: 138 mmol/L (ref 135–145)
Total Bilirubin: 0.8 mg/dL (ref 0.3–1.2)
Total Protein: 8.6 g/dL — ABNORMAL HIGH (ref 6.5–8.1)

## 2019-05-23 LAB — SYNOVIAL FLUID, CRYSTAL

## 2019-05-23 LAB — CBC WITH DIFFERENTIAL/PLATELET
Abs Immature Granulocytes: 0.03 10*3/uL (ref 0.00–0.07)
Basophils Absolute: 0 10*3/uL (ref 0.0–0.1)
Basophils Relative: 0 %
Eosinophils Absolute: 0 10*3/uL (ref 0.0–0.5)
Eosinophils Relative: 0 %
HCT: 37.1 % (ref 36.0–46.0)
Hemoglobin: 12.2 g/dL (ref 12.0–15.0)
Immature Granulocytes: 1 %
Lymphocytes Relative: 12 %
Lymphs Abs: 0.5 10*3/uL — ABNORMAL LOW (ref 0.7–4.0)
MCH: 33.4 pg (ref 26.0–34.0)
MCHC: 32.9 g/dL (ref 30.0–36.0)
MCV: 101.6 fL — ABNORMAL HIGH (ref 80.0–100.0)
Monocytes Absolute: 0.2 10*3/uL (ref 0.1–1.0)
Monocytes Relative: 5 %
Neutro Abs: 3.7 10*3/uL (ref 1.7–7.7)
Neutrophils Relative %: 82 %
Platelets: 189 10*3/uL (ref 150–400)
RBC: 3.65 MIL/uL — ABNORMAL LOW (ref 3.87–5.11)
RDW: 12.7 % (ref 11.5–15.5)
WBC: 4.5 10*3/uL (ref 4.0–10.5)
nRBC: 0 % (ref 0.0–0.2)

## 2019-05-23 LAB — BODY FLUID CULTURE: Culture: NO GROWTH

## 2019-05-23 LAB — TROPONIN I (HIGH SENSITIVITY)
Troponin I (High Sensitivity): 7 ng/L (ref <18)
Troponin I (High Sensitivity): 7 ng/L (ref <18)

## 2019-05-23 LAB — BRAIN NATRIURETIC PEPTIDE: B Natriuretic Peptide: 23 pg/mL (ref 0.0–100.0)

## 2019-05-23 MED ORDER — FAMOTIDINE 20 MG PO TABS: 20.0000 mg | ORAL_TABLET | Freq: Two times a day (BID) | ORAL | 0 refills | Status: DC

## 2019-05-23 MED ORDER — IOHEXOL 350 MG/ML SOLN
75.0000 mL | Freq: Once | INTRAVENOUS | Status: AC | PRN
  Administered 2019-05-23: 14:00:00 75 mL via INTRAVENOUS

## 2019-05-23 MED ORDER — ONDANSETRON HCL 4 MG/2ML IJ SOLN
4.0000 mg | Freq: Once | INTRAMUSCULAR | Status: AC
  Administered 2019-05-23: 13:00:00 4 mg via INTRAVENOUS
  Filled 2019-05-23: qty 2

## 2019-05-23 MED ORDER — MORPHINE SULFATE (PF) 4 MG/ML IV SOLN
6.0000 mg | Freq: Once | INTRAVENOUS | Status: AC
  Administered 2019-05-23: 6 mg via INTRAVENOUS
  Filled 2019-05-23: qty 2

## 2019-05-23 MED ORDER — ALUM & MAG HYDROXIDE-SIMETH 200-200-20 MG/5ML PO SUSP
30.0000 mL | Freq: Once | ORAL | Status: AC
  Administered 2019-05-23: 30 mL via ORAL
  Filled 2019-05-23: qty 30

## 2019-05-23 MED ORDER — SODIUM CHLORIDE 0.9 % IV BOLUS
1000.0000 mL | Freq: Once | INTRAVENOUS | Status: AC
  Administered 2019-05-23: 1000 mL via INTRAVENOUS

## 2019-05-23 MED ORDER — SUCRALFATE 1 G PO TABS: 1.0000 g | ORAL_TABLET | Freq: Four times a day (QID) | ORAL | 0 refills | Status: DC

## 2019-05-23 NOTE — Telephone Encounter (Signed)
Jenny-please also address her pain medication issue/refill at the visit.  Thank you

## 2019-05-23 NOTE — ED Notes (Signed)
Pt alert and oriented X4, active, cooperative, pt in NAD. RR even and unlabored, color WNL.  Pt informed to return if any life threatening symptoms occur.  Discharge and followup instructions reviewed. Ambulates safely. 

## 2019-05-23 NOTE — Telephone Encounter (Signed)
Patient showed up at 1010 in lobby. Pt added to sch.

## 2019-05-23 NOTE — Telephone Encounter (Signed)
Patient called reporting that she was discharged from hospital yesterday and that she was not getting IVF while there and she is feeling weak and wants to come in for IV fluids today . She asks she be called back with an appointment time. Please advise

## 2019-05-23 NOTE — ED Notes (Signed)
Patient transported to CT 

## 2019-05-23 NOTE — Telephone Encounter (Signed)
Per Sonia Baller, NP - order - cbc, metc, mag level, and have patient come this morning for iv fluids today.

## 2019-05-23 NOTE — Progress Notes (Signed)
Patient arrived to the cancer center complaining of acute sharp chest pain.  She was also vomiting.  She was taken directly to the emergency room by [facility], RN for further evaluation.  She was not evaluated by the NP.  She was just discharged from the hospital on 05/22/2023 where she was complaining of severe left shoulder pain.  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  Had arthrocentesis with negative cultures.  Status post joint aspiration on 05/19/2019 by Dr. Sabra Heck from orthopedic.  She was started on broad-spectrum antibiotics and also received steroid injections.  She was instructed to continue to use her sling and apply ice as tolerated.  She was admitted from 05/18/2019 through 05/22/2019.  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   We will continue to follow while in the emergency room.  Dr. Lynett Fish updated.  Faythe Casa, NP 05/23/2019 10:49 AM

## 2019-05-23 NOTE — Progress Notes (Deleted)
Patient not evaluated.  She was taken directly to the emergency room for acute onset chest pain.  Faythe Casa, NP 05/23/2019 10:55 AM

## 2019-05-23 NOTE — ED Triage Notes (Signed)
Pt sent from Cancer center with CP and vomiting. Pt appears uncomfortable and vomiting in triage. PT was discharged from hospital last night for infection in shoulder.

## 2019-05-23 NOTE — Telephone Encounter (Signed)
She was discharged yesterday

## 2019-05-23 NOTE — ED Provider Notes (Signed)
[facility] Emergency Department Provider Note  ____________________________________________   First MD Initiated Contact with Patient 05/23/19 1129     (approximate)  I have reviewed the triage vital signs and the nursing notes.   HISTORY  Chief Complaint Chest Pain    HPI Joanna Hall is a 53 y.o. female with past medical history as below here with atypical chest pain.  The patient states that her symptoms started yesterday. She was just recently discharged from the hospital following admission from 8/2-8/6 for possible septic/inflammatory arthritis, for which she is on ABX on and was started on prednisone. Since returning home, she's had acute, severe, epigastric and left upper chest pain. This seems worse w/ movement and palpation, but also deep inspiration. She's had nausea and vomiting. The n/v is a chronic issue but seems worse since the onset of CP. No overt abd pain. No fever, chills. No dysuria or hematuria. No flank pain. She does have h/o PUD. Denies any worsening of her shoulder pain. 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     Past Medical History:  Diagnosis Date   Abdominal pain 06/10/2018   Abnormal cervical Papanicolaou smear 09/18/2017   Anxiety    Aortic atherosclerosis (HCC)    Arthritis    neck and knees   Blood clots in brain    both lungs and right kidney   Blood transfusion without reported diagnosis    Cervical cancer ([place]) 09/2016   mets lung   Chronic anal fissure    Chronic diarrhea    Dyspnea    Erosive gastropathy 09/18/2017   Factor V Leiden mutation ([place])    Fecal incontinence    Genital warts    GERD (gastroesophageal reflux disease)    Heart murmur    Hematochezia    Hemorrhoids    Hepatitis C    Chronic, 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  Negative test 04/6733   Ileocolic anastomotic leak    Infarction of kidney (HCC) left kidney   and uterus   Intestinal infection due to Clostridium difficile 09/18/2017   Macrocytic anemia with vitamin B12 deficiency    Multiple gastric ulcers    Nausea vomiting and diarrhea    Pancolitis ([place]) 07/27/2018   Perianal condylomata    Pneumonia    History of   Pulmonary nodules    Rectal bleeding    Small bowel obstruction ([place]) 08/2017   Stiff neck    limited right turn   Vitamin D deficiency     Patient Active Problem List   Diagnosis Date Noted   Chest pain in adult 05/19/2019   Intractable pain 05/19/2019   Cancer associated pain    Proctitis, radiation    Palliative care encounter    Encounter for monitoring opioid maintenance therapy 11/19/2018   GI bleed 10/08/2018   Adrenal insufficiency ([place]) 09/03/2018   Chronic diarrhea 06/25/2018   Chronic anticoagulation 06/25/2018   Vomiting    Encounter for antineoplastic chemotherapy 06/04/2018   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   Lung metastasis ([place]) 05/15/2018   Closed fracture of distal end of radius 05/04/2018   History of total knee arthroplasty 04/24/2018   Osteoarthritis of left knee 02/28/2018   Condyloma acuminatum of anus  s/p ablation 02/22/2018 02/22/2018   Condyloma acuminatum of vagina s/p ablation 02/22/2018 02/22/2018   Positive ANA (antinuclear antibody) 02/04/2018   Aortic atherosclerosis ([place]) 01/15/2018   Elevated MCV 01/15/2018   Anemia 01/15/2018   Genital warts 01/15/2018   Vitamin D deficiency 01/15/2018   Pernicious anemia    Impingement syndrome of shoulder region 12/19/2017   Multiple joint pain 12/19/2017   Osteoarthritis of right knee 12/19/2017   B12 deficiency 12/11/2017   Lung nodules 12/11/2017   History of Clostridium difficile infection 10/23/2017    Factor V Leiden ([place]) 10/19/2017   Goals of care, counseling/discussion 10/19/2017   Cervical cancer, FIGO stage IB1 ([place]) 09/18/2017   Chronic hepatitis C without hepatic coma ([place]) 09/18/2017   Cytopenia 09/18/2017   Diarrhea 09/18/2017   Lower abdominal pain 09/18/2017   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   Malignant neoplasm of overlapping sites of cervix ([place]) 09/18/2017   Renal insufficiency 09/18/2017   Wound infection after surgery 09/12/2017   Hypokalemia    Hypomagnesemia    Cervical arthritis 07/18/2017   Dysuria 06/20/2017   Metastatic cancer ([place]) 05/12/2017   Essential hypertension 03/15/2017   Anemia in other chronic diseases classified elsewhere 03/01/2017   Chemotherapy-induced neutropenia ([place]) 01/29/2017   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    Past Surgical History:  Procedure Laterality Date   CHOLECYSTECTOMY     COLON SURGERY  08/2017   resection   COLONOSCOPY WITH PROPOFOL N/A 12/20/2017   Procedure: COLONOSCOPY WITH PROPOFOL;  Surgeon: [name], MD;  Location: [facility] ENDOSCOPY;  Service: Gastroenterology;  Laterality: N/A;   COLONOSCOPY WITH PROPOFOL N/A 07/30/2018   Procedure: COLONOSCOPY WITH PROPOFOL;  Surgeon: [name], MD;  Location: [facility] ENDOSCOPY;  Service: Gastroenterology;  Laterality: N/A;   COLONOSCOPY WITH PROPOFOL N/A 10/10/2018   Procedure: COLONOSCOPY WITH PROPOFOL;  Surgeon: Lucilla Lame, MD;  Location: [facility] ENDOSCOPY;  Service: Endoscopy;  Laterality: N/A;   DIAGNOSTIC LAPAROSCOPY     ESOPHAGOGASTRODUODENOSCOPY (EGD) WITH PROPOFOL N/A 12/20/2017   Procedure: ESOPHAGOGASTRODUODENOSCOPY (EGD) WITH PROPOFOL;  Surgeon: [name], MD;  Location: [facility] ENDOSCOPY;  Service: Gastroenterology;  Laterality: N/A;   ESOPHAGOGASTRODUODENOSCOPY (EGD) WITH PROPOFOL  07/30/2018   Procedure: ESOPHAGOGASTRODUODENOSCOPY (EGD) WITH PROPOFOL;  Surgeon: [name], MD;  Location: [facility]  ENDOSCOPY;  Service: Gastroenterology;;   [place] N/A 11/21/2018   Procedure: FLEXIBLE SIGMOIDOSCOPY;  Surgeon: [name], MD;  Location: [facility] ENDOSCOPY;  Service: Gastroenterology;  Laterality: N/A;   LAPAROTOMY N/A 08/31/2017   Procedure: EXPLORATORY LAPAROTOMY for SBO, ileocolectomy, removal of piece of uterine wall;  Surgeon: [name], MD;  Location: [facility] ORS;  Service: General;  Laterality: N/A;   LASER ABLATION CONDOLAMATA N/A 02/22/2018   Procedure: LASER ABLATION/REMOVAL OF KGYJEHUDJSH AROUND ANUS AND VAGINA;  Surgeon: Michael Boston, MD;  Location: [place];  Service: General;  Laterality: N/A;   OOPHORECTOMY     PORTA CATH INSERTION N/A 05/13/2018   Procedure: PORTA CATH INSERTION;  Surgeon: Katha Cabal, MD;  Location: [place] CV LAB;  Service: Cardiovascular;  Laterality: N/A;   SMALL INTESTINE SURGERY     TANDEM RING INSERTION     x3   THORACOTOMY     TOTAL KNEE ARTHROPLASTY Left 04/24/2018   Procedure: TOTAL KNEE ARTHROPLASTY;  Surgeon: Lovell Sheehan, MD;  Location: [facility] ORS;  Service: Orthopedics;  Laterality: Left;    Prior to Admission medications   Medication Sig Start Date End Date Taking? Authorizing Provider  amitriptyline (ELAVIL) 75  MG tablet Take 1 tablet (75 mg total) by mouth at bedtime. 05/06/19 08/04/19  [name], MD  Calcium Carb-Cholecalciferol (CALCIUM 500 +D) 500-400 MG-UNIT TABS Take 2 tablets by mouth daily.    [provider]  diphenoxylate-atropine (LOMOTIL) 2.5-0.025 MG tablet TAKE (2) TABLETS FOUR TIMES DAILY. 04/21/19   [name], MD  doxycycline (VIBRA-TABS) 100 MG tablet Take 1 tablet (100 mg total) by mouth every 12 (twelve) hours. 05/22/19   Epifanio Lesches, MD  famotidine (PEPCID) 20 MG tablet Take 1 tablet (20 mg total) by mouth 2 (two) times daily for 5 days. 05/23/19 05/28/19  Duffy Bruce, MD  feeding supplement, ENSURE ENLIVE, (ENSURE ENLIVE) LIQD  Take 237 mLs by mouth 3 (three) times daily between meals. 05/22/19   Epifanio Lesches, MD  hydrOXYzine (ATARAX/VISTARIL) 10 MG tablet Take 1 tablet (10 mg total) by mouth 3 (three) times daily as needed. 05/08/19   Cammie Sickle, MD  hyoscyamine (LEVBID) 0.375 MG 12 hr tablet Take 1 tablet (0.375 mg total)by mouth 2 (two) times daily. 01/03/19   [name], MD  meclizine (ANTIVERT) 25 MG tablet Take 1 tablet (25 mg total) by mouth 2 (two) times daily as needed for dizziness. 01/26/19   Merlyn Lot, MD  Multiple Vitamins-Minerals (MULTIVITAMIN WITH MINERALS) tablet Take 1 tablet by mouth daily. 08/01/18 08/01/19  Loletha Grayer, MD  ondansetron (ZOFRAN ODT) 4 MG disintegrating tablet Take 1 tablet (4 mg total) by mouth every 8 (eight) hours as needed for nausea or vomiting. 03/20/19   Verlon Au, NP  oxyCODONE (OXYCONTIN) 15 mg 12 hr tablet Take 1 tablet (15 mg total) by mouth every 12 (twelve) hours. 04/25/19   Verlon Au, NP  Oxycodone HCl 10 MG TABS Take 1 tablet (10 mg total) by mouth every 6 (six) hours as needed (for breakthrough pain). 05/09/19   Verlon Au, NP  pantoprazole (PROTONIX) 40 MG tablet Take 1 tablet (40 mg total) by mouth daily. 03/18/19   Cammie Sickle, MD  potassium chloride SA (K-DUR) 20 MEQ tablet Take 1 tablet (20 mEq total) by mouth 2 (two) times daily. 05/22/19   Epifanio Lesches, MD  promethazine (PHENERGAN) 25 MG tablet Take 1 tablet (25 mg total) by mouth every 8 (eight) hours as needed for nausea or vomiting. 03/05/19   Earlie Server, MD  rivaroxaban (XARELTO) 20 MG TABS tablet Take 1 tablet (20 mg total) by mouth daily with supper. Patient taking differently: Take 20 mg by mouth daily with supper. Takes in the morning 05/05/19   Cammie Sickle, MD  sucralfate (CARAFATE) 1 g tablet Take 1 tablet (1 g total) by mouth 4 (four) times daily for 5 days. 05/23/19 05/28/19  Duffy Bruce, MD  VENTOLIN HFA 108 (90 Base) MCG/ACT inhaler  Inhale 1-2 puffs into the lungs every 4 (four) hours as needed for shortness of breath. 02/19/19   Verlon Au, NP    Allergies Ketamine  Family History  Problem Relation Age of Onset   Hypertension Father    Diabetes Father    Alcohol abuse Daughter    Hypertension Maternal Grandmother    Diabetes Maternal Grandmother    Diabetes Paternal Grandmother    Hypertension Paternal Grandmother     Social History Social History   Tobacco Use   Smoking status: Former Smoker    Quit date: 10/16/2006    Years since quitting: 12.6   Smokeless tobacco: Never Used  Substance Use Topics   Alcohol  use: Not Currently    Frequency: Never    Comment: seldom   Drug use: Yes    Types: Marijuana    Comment: not very often    Review of Systems  Review of Systems  Constitutional: Negative for fatigue and fever.  HENT: Negative for congestion and sore throat.   Eyes: Negative for visual disturbance.  Respiratory: Positive for chest tightness. Negative for cough and shortness of breath.   Cardiovascular: Positive for chest pain.  Gastrointestinal: Positive for nausea and vomiting. Negative for abdominal pain and diarrhea.  Genitourinary: Negative for flank pain.  Musculoskeletal: Positive for arthralgias. Negative for back pain and neck pain.  Skin: Negative for rash and wound.  Neurological: Positive for weakness.  All other systems reviewed and are negative.    ____________________________________________  PHYSICAL EXAM:      VITAL SIGNS: ED Triage Vitals  Enc Vitals Group     BP 05/23/19 1039 (!) 162/97     Pulse Rate 05/23/19 1038 (!) 108     Resp 05/23/19 1038 20     Temp 05/23/19 1038 99.4 F (37.4 C)     Temp Source 05/23/19 1038 Oral     SpO2 05/23/19 1038 100 %     Weight --      Height --      Head Circumference --      Peak Flow --      Pain Score 05/23/19 1038 9     Pain Loc --      Pain Edu? --      Excl. in [place]? --      Physical Exam Vitals  signs and nursing note reviewed.  Constitutional:      General: She is not in acute distress.    Appearance: She is well-developed.  HENT:     Head: Normocephalic and atraumatic.  Eyes:     Conjunctiva/sclera: Conjunctivae normal.  Neck:     Musculoskeletal: Neck supple.  Cardiovascular:     Rate and Rhythm: Normal rate and regular rhythm.     Heart sounds: Normal heart sounds. No murmur. No friction rub.  Pulmonary:     Effort: Pulmonary effort is normal. No respiratory distress.     Breath sounds: Normal breath sounds. No wheezing or rales.  Chest:     Comments: Moderate TTP over left anterior chest wall. Right port c/d/i without overlying erythema, fluctuance, or induration.  Abdominal:     General: There is no distension.     Palpations: Abdomen is soft.     Tenderness: There is no abdominal tenderness.     Comments: Very minimal epigastric TTP. No rebound or guarding.  Skin:    General: Skin is warm.     Capillary Refill: Capillary refill takes less than 2 seconds.  Neurological:     Mental Status: She is alert and oriented to person, place, and time.     Motor: No abnormal muscle tone.       ____________________________________________   LABS (all labs ordered are listed, but only abnormal results are displayed)  Labs Reviewed  CBC WITH DIFFERENTIAL/PLATELET - Abnormal; Notable for the following components:      Result Value   RBC 3.65 (*)    MCV 101.6 (*)    Lymphs Abs 0.5 (*)    All other components within normal limits  COMPREHENSIVE METABOLIC PANEL - Abnormal; Notable for the following components:   Total Protein 8.6 (*)    AST 52 (*)  ALT 65 (*)    Alkaline Phosphatase 237 (*)    All other components within normal limits  BRAIN NATRIURETIC PEPTIDE  TROPONIN I (HIGH SENSITIVITY)  TROPONIN I (HIGH SENSITIVITY)    ____________________________________________  EKG: Normal sinus rhythm, VR 80. Normal intervals. No acute ST-t segment changes. No  ischemia or infarct. ________________________________________  RADIOLOGY All imaging, including plain films, CT scans, and ultrasounds, independently reviewed by me, and interpretations confirmed via formal radiology reads.  ED MD interpretation:   CT Angio: Neg for acute PE, PNA, PTX, or abnormality; visualized abdomen unremarkable  Official radiology report(s): Ct Angio Chest Pe W And/or Wo Contrast  Result Date: 05/23/2019 CLINICAL DATA:  Chest pain.  Metastatic cervical carcinoma EXAM: CT ANGIOGRAPHY CHEST WITH CONTRAST TECHNIQUE: Multidetector CT imaging of the chest was performed using the standard protocol during bolus administration of intravenous contrast. Multiplanar CT image reconstructions and MIPs were obtained to evaluate the vascular anatomy. CONTRAST:  30m OMNIPAQUE IOHEXOL 350 MG/ML SOLN COMPARISON:  Chest CT May 18, 2019 FINDINGS: Cardiovascular: There is no demonstrable pulmonary embolus. There is no thoracic aortic aneurysm or dissection. There is calcification at the origin of the right innominate artery. Other visualized great vessels appear unremarkable. There are scattered foci of aortic atherosclerosis. There is no pericardial effusion or pericardial thickening evident. Port-A-Cath tip near cavoatrial junction. Mediastinum/Nodes: Visualized thyroid appears unremarkable. There is no appreciable thoracic adenopathy. No esophageal lesions evident. Lungs/Pleura: There are nodular metastases throughout the lungs bilaterally, several of which are cavitated, stable. Nodular opacities range in size from as small as 2 mm to as large as 1.6 cm. There is no edema or consolidation. No pleural effusions. Upper Abdomen: Gallbladder absent. Upper abdominal structures otherwise appear unremarkable. Musculoskeletal: No blastic or lytic bone lesions. No chest wall lesions evident. Note that the port is present anteriorly on the right, superficial to the pectoralis muscles. Review of the MIP  images confirms the above findings. IMPRESSION: 1. No demonstrable pulmonary embolus. No thoracic aortic aneurysm or dissection. Foci of aortic atherosclerosis noted. 2. Nodular lesions throughout the lungs consistent with metastatic disease. Multiple parenchymal lung metastases show cavitation. Largest metastatic focus measures 1.6 cm in size. 3.  No evident adenopathy. 4.  Gallbladder absent. Aortic Atherosclerosis (ICD10-I70.0). Electronically Signed   By: WLowella GripIII M.D.   On: 05/23/2019 14:07    ____________________________________________  PROCEDURES   Procedure(s) performed (including Critical Care):  Procedures  ____________________________________________  INITIAL IMPRESSION / [place] / [place]RSE  As part of my medical decision making, I reviewed the following data within the electronic MEDICAL RECORD NUMBER Notes from prior ED visits and [place] Controlled Substance Database      *KBRITTINEY DICOSTANZOwas evaluated in Emergency Department on 05/23/2019 for the symptoms described in the history of present illness. She was evaluated in the context of the global COVID-19 pandemic, which necessitated consideration that the patient might be at risk for infection with the SARS-CoV-2 virus that causes COVID-19. Institutional protocols and algorithms that pertain to the evaluation of patients at risk for COVID-19 are in a state of rapid change based on information released by regulatory bodies including the CDC and federal and state organizations. These policies and algorithms were followed during the patient's care in the ED.  Some ED evaluations and interventions may be delayed as a result of limited staffing during the pandemic.*      Medical Decision Making: 53yo F here with acute onset left-sided CP. CP  is sharp, positional, but also mildly epigastric. DDx includes gastritis, PUD, non-bleeding, chest wall pain. Do not suspect PE based on normal CT Angio, and pt is not  hypoxic, tachypneic, or tachycardic. Trop neg x 2 with reassuring EKG - do not suspect ACS. Low clinical suspicion for dissection and Angio neg, trop neg as mentioned. No signs of PNA or other acute abnormality on CT. She has some mild epigastric TTP and chronic LFT/AlkP elevation, which I suspect is 2/2 her metastatic dz and she is status post cholecystectomy.   Given reassuring trop x 2, neg CT Angio, o/w reassuring baseline labs and resolution of pain with GI cocktail, will tx with additional antacids, home analgesics. Re: her n/v/d, thi sis a known chronic issue with her cancer tx and she has been given fluids here. She feel smuch better and is requesting d/c, which I think is reasonable. Good return precautions given.  ____________________________________________  FINAL CLINICAL IMPRESSION(S) / ED DIAGNOSES  Final diagnoses:  Atypical chest pain     MEDICATIONS GIVEN DURING THIS VISIT:  Medications  morphine 4 MG/ML injection 6 mg (6 mg Intravenous Given 05/23/19 1237)  ondansetron (ZOFRAN) injection 4 mg (4 mg Intravenous Given 05/23/19 1236)  sodium chloride 0.9 % bolus 1,000 mL (0 mLs Intravenous Stopped 05/23/19 1409)  iohexol (OMNIPAQUE) 350 MG/ML injection 75 mL (75 mLs Intravenous Contrast Given 05/23/19 1346)  alum & mag hydroxide-simeth (MAALOX/MYLANTA) 200-200-20 MG/5ML suspension 30 mL (30 mLs Oral Given 05/23/19 1538)     ED Discharge Orders         Ordered    famotidine (PEPCID) 20 MG tablet  2 times daily     05/23/19 1534    sucralfate (CARAFATE) 1 g tablet  4 times daily     05/23/19 1534           Note:  This document was prepared using Dragon voice recognition software and may include unintentional dictation errors.   Duffy Bruce, MD 05/23/19 [phone]

## 2019-05-25 LAB — CULTURE, BLOOD (ROUTINE X 2)
Culture: NO GROWTH
Culture: NO GROWTH

## 2019-05-28 ENCOUNTER — Other Ambulatory Visit: Payer: Self-pay

## 2019-05-29 ENCOUNTER — Encounter: Payer: Self-pay | Admitting: Oncology

## 2019-05-29 ENCOUNTER — Inpatient Hospital Stay: Payer: Medicaid Other

## 2019-05-29 ENCOUNTER — Other Ambulatory Visit: Payer: Self-pay

## 2019-05-29 ENCOUNTER — Encounter: Payer: Self-pay | Admitting: Internal Medicine

## 2019-05-29 ENCOUNTER — Inpatient Hospital Stay: Payer: Medicaid Other | Attending: Internal Medicine

## 2019-05-29 ENCOUNTER — Inpatient Hospital Stay (HOSPITAL_BASED_OUTPATIENT_CLINIC_OR_DEPARTMENT_OTHER): Payer: Medicaid Other | Admitting: Internal Medicine

## 2019-05-29 VITALS — BP 136/98 | HR 76 | Temp 96.5°F | Resp 18 | Wt 166.8 lb

## 2019-05-29 DIAGNOSIS — C539 Malignant neoplasm of cervix uteri, unspecified: Secondary | ICD-10-CM | POA: Insufficient documentation

## 2019-05-29 DIAGNOSIS — Z79899 Other long term (current) drug therapy: Secondary | ICD-10-CM | POA: Insufficient documentation

## 2019-05-29 DIAGNOSIS — Z7189 Other specified counseling: Secondary | ICD-10-CM

## 2019-05-29 DIAGNOSIS — Z86718 Personal history of other venous thrombosis and embolism: Secondary | ICD-10-CM | POA: Diagnosis not present

## 2019-05-29 DIAGNOSIS — C538 Malignant neoplasm of overlapping sites of cervix uteri: Secondary | ICD-10-CM

## 2019-05-29 DIAGNOSIS — Z452 Encounter for adjustment and management of vascular access device: Secondary | ICD-10-CM

## 2019-05-29 DIAGNOSIS — Z7901 Long term (current) use of anticoagulants: Secondary | ICD-10-CM | POA: Diagnosis not present

## 2019-05-29 DIAGNOSIS — G629 Polyneuropathy, unspecified: Secondary | ICD-10-CM | POA: Diagnosis not present

## 2019-05-29 DIAGNOSIS — Z86711 Personal history of pulmonary embolism: Secondary | ICD-10-CM | POA: Diagnosis not present

## 2019-05-29 DIAGNOSIS — C7800 Secondary malignant neoplasm of unspecified lung: Secondary | ICD-10-CM | POA: Diagnosis not present

## 2019-05-29 DIAGNOSIS — Z95828 Presence of other vascular implants and grafts: Secondary | ICD-10-CM

## 2019-05-29 DIAGNOSIS — Z90722 Acquired absence of ovaries, bilateral: Secondary | ICD-10-CM | POA: Insufficient documentation

## 2019-05-29 DIAGNOSIS — Z7952 Long term (current) use of systemic steroids: Secondary | ICD-10-CM | POA: Diagnosis not present

## 2019-05-29 DIAGNOSIS — Z9221 Personal history of antineoplastic chemotherapy: Secondary | ICD-10-CM | POA: Diagnosis not present

## 2019-05-29 DIAGNOSIS — Z5112 Encounter for antineoplastic immunotherapy: Secondary | ICD-10-CM | POA: Diagnosis present

## 2019-05-29 DIAGNOSIS — Z923 Personal history of irradiation: Secondary | ICD-10-CM | POA: Insufficient documentation

## 2019-05-29 LAB — COMPREHENSIVE METABOLIC PANEL
ALT: 54 U/L — ABNORMAL HIGH (ref 0–44)
AST: 36 U/L (ref 15–41)
Albumin: 3.4 g/dL — ABNORMAL LOW (ref 3.5–5.0)
Alkaline Phosphatase: 178 U/L — ABNORMAL HIGH (ref 38–126)
Anion gap: 8 (ref 5–15)
BUN: 17 mg/dL (ref 6–20)
CO2: 23 mmol/L (ref 22–32)
Calcium: 9 mg/dL (ref 8.9–10.3)
Chloride: 105 mmol/L (ref 98–111)
Creatinine, Ser: 0.68 mg/dL (ref 0.44–1.00)
GFR calc Af Amer: >60 mL/min (ref >60)
GFR calc non Af Amer: >60 mL/min (ref >60)
Glucose, Bld: 98 mg/dL (ref 70–99)
Potassium: 3.9 mmol/L (ref 3.5–5.1)
Sodium: 136 mmol/L (ref 135–145)
Total Bilirubin: 0.4 mg/dL (ref 0.3–1.2)
Total Protein: 7.1 g/dL (ref 6.5–8.1)

## 2019-05-29 LAB — CBC WITH DIFFERENTIAL/PLATELET
Abs Immature Granulocytes: 0.02 10*3/uL (ref 0.00–0.07)
Basophils Absolute: 0 10*3/uL (ref 0.0–0.1)
Basophils Relative: 1 %
Eosinophils Absolute: 0.1 10*3/uL (ref 0.0–0.5)
Eosinophils Relative: 1 %
HCT: 33.6 % — ABNORMAL LOW (ref 36.0–46.0)
Hemoglobin: 11.1 g/dL — ABNORMAL LOW (ref 12.0–15.0)
Immature Granulocytes: 1 %
Lymphocytes Relative: 19 %
Lymphs Abs: 0.8 10*3/uL (ref 0.7–4.0)
MCH: 33.3 pg (ref 26.0–34.0)
MCHC: 33 g/dL (ref 30.0–36.0)
MCV: 100.9 fL — ABNORMAL HIGH (ref 80.0–100.0)
Monocytes Absolute: 0.3 10*3/uL (ref 0.1–1.0)
Monocytes Relative: 6 %
Neutro Abs: 3.2 10*3/uL (ref 1.7–7.7)
Neutrophils Relative %: 72 %
Platelets: 205 10*3/uL (ref 150–400)
RBC: 3.33 MIL/uL — ABNORMAL LOW (ref 3.87–5.11)
RDW: 13.4 % (ref 11.5–15.5)
WBC: 4.4 10*3/uL (ref 4.0–10.5)
nRBC: 0 % (ref 0.0–0.2)

## 2019-05-29 LAB — MAGNESIUM: Magnesium: 1.5 mg/dL — ABNORMAL LOW (ref 1.7–2.4)

## 2019-05-29 LAB — TSH: TSH: 3.432 u[IU]/mL (ref 0.350–4.500)

## 2019-05-29 MED ORDER — MAGNESIUM SULFATE 4 GM/100ML IV SOLN
4.0000 g | Freq: Once | INTRAVENOUS | Status: AC
  Administered 2019-05-29: 10:00:00 4 g via INTRAVENOUS
  Filled 2019-05-29: qty 100

## 2019-05-29 MED ORDER — SODIUM CHLORIDE 0.9% FLUSH
10.0000 mL | Freq: Once | INTRAVENOUS | Status: AC
  Administered 2019-05-29: 09:00:00 10 mL via INTRAVENOUS
  Filled 2019-05-29: qty 10

## 2019-05-29 MED ORDER — OXYCODONE HCL ER 15 MG PO T12A: 15.0000 mg | EXTENDED_RELEASE_TABLET | Freq: Two times a day (BID) | ORAL | 0 refills | Status: DC

## 2019-05-29 MED ORDER — SODIUM CHLORIDE 0.9 % IV SOLN
200.0000 mg | Freq: Once | INTRAVENOUS | Status: AC
  Administered 2019-05-29: 200 mg via INTRAVENOUS
  Filled 2019-05-29: qty 8

## 2019-05-29 MED ORDER — HEPARIN SOD (PORK) LOCK FLUSH 100 UNIT/ML IV SOLN
500.0000 [IU] | Freq: Once | INTRAVENOUS | Status: AC | PRN
  Administered 2019-05-29: 500 [IU]
  Filled 2019-05-29: qty 5

## 2019-05-29 MED ORDER — SODIUM CHLORIDE 0.9 % IV SOLN
Freq: Once | INTRAVENOUS | Status: AC
  Administered 2019-05-29: 10:00:00 via INTRAVENOUS
  Filled 2019-05-29: qty 250

## 2019-05-29 MED ORDER — SODIUM CHLORIDE 0.9 % IV SOLN: Freq: Once | INTRAVENOUS | Status: DC

## 2019-05-29 MED ORDER — ONDANSETRON HCL 4 MG/2ML IJ SOLN
8.0000 mg | Freq: Once | INTRAMUSCULAR | Status: AC
  Administered 2019-05-29: 8 mg via INTRAVENOUS

## 2019-05-29 MED ORDER — ONDANSETRON HCL 4 MG/2ML IJ SOLN
INTRAMUSCULAR | Status: AC
  Filled 2019-05-29: qty 4

## 2019-05-29 NOTE — Progress Notes (Signed)
Patient feeling sick to her stomach. PRN Zofran given. Will continue to monitor

## 2019-05-29 NOTE — Assessment & Plan Note (Addendum)
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 On [place]- ? Immune related arthtitis [see below]  # proceed with Keytruda cycle #2 Labs today reviewed;  acceptable for treatment today.   # Left effusion/inflammatory arthritis-status post aspiration negative for infection.  She is status post steroid injection.  Improved.  Discussed that it is possible side effect from Keytruda/but again unusual to happen just after 1 treatment [approximately 10 days].  Given progressive lung disease/poor tolerance to chemotherapy, discussed that [place] be continued.  Patient understands the risk versus benefits wants [place]  Will monitor closely.  #Chronic diarrhea- worse/quality of life issue-"short bowel"discussed with GI re: colostomy; awaiting evaluation with surgery in 1st week of aug.   #Peripheral neuropathy-stable  #Hypomagnesemia-secondary platinum chemotherapy-mag 1.5; IV mag today.  Stable  #Chronic right lower quadrant abdominal pain/scar tissue versus others-on oxycodone stable new prescription given.  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   # DISPOSITION:  # Keytruda/Mag today. # 1 weeks- mag level; IVFs over 1 hour /Mag2h infusion # 2 weeks-  mag level; IVFs over 1 hour /Mag2h infusion # follow up in 3 weeks-MD-Keytruda /cbc/cmp/mag/; Mag 4 gm/2 hour- Dr.B

## 2019-05-29 NOTE — Progress Notes (Signed)
Reader NOTE  Patient Care Team: [place] as PCP - General (Family Medicine) Mellody Drown, MD as Consulting Physician (Obstetrics and Gynecology) [name], MD as Consulting Physician (Gastroenterology) Michael Boston, MD as Consulting Physician (General Surgery) Lovell Sheehan, MD as Consulting Physician (Orthopedic Surgery) Cammie Sickle, MD as Consulting Physician (Oncology) Earnestine Leys, MD as Consulting Physician (Orthopedic Surgery)  CHIEF COMPLAINTS/PURPOSE OF CONSULTATION: Cervical cancer  #  Oncology History Overview Note  # dec 2017- CERVICAL ADENO CA [[place]]; Stage IB [Dr. Christene Slates at [facility] in [place], [place];No surgery- Chemo-RT;  # 2018-sep - lung nodules [in [place]]  # AUG 20th, 2019-#1 Carbo-Taxol s/p 6 cycles- [No avastin sec to blood clots]: Feb 2020-bilateral lung nodules with 1 cm in size. March 2020- finished carbo-taxol #7; poor tolerance to [place].  # April 15 th 2020- Start Taxol weekly x 3; one week OFF;  # July 2020-CT- bil cavitary lesions- slightly bigger by few mm/overall STABLE; CT a/p-NED. 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  She received treatment from 11/2016-05/2017.  01/2017 cisplatin x 2 and carboplatin x 1 (01/29/2017) due to ARF and XRT.  XRT was followed by T & O on 02/01/2017 and T & N 02/10/2017 and 02/20/2017.  Course was complicated by 80 pound weight loss, nausea, vomiting, electrolyte wasting (potassium and magnesium).   She describes that.  Is been sick constantly requiring at least 20 hospitalizations.  Follow-up CT chest and PET on 06/2017. Per patient, 'radiation worked' and no disease in the abdomen.  At that time she was noted to have lung nodules that were growing and follow-up imaging was scheduled for 10/2017.  She was admitted to hospital in [place] for small bowel obstruction which was managed conservatively and she was home for a week prior to traveling to [place] for Thanksgiving holiday where she has family.  She presented to ER in [place] on 08/2017 with nausea, vomiting, and lower abdominal pain.  Symptoms did not respond to conservative treatment.   CT on 08/26/2017 revealed small bowel obstruction with transition in the pelvis just superior to the uterus rather was a long segment of distal ileum with fatty wall thickening compatible with chronic inflammation and/or radiation enteritis. Imaging showed numerous pulmonary nodules consistent with metastatic disease. She underwent laparotomy and right ileocolectomy on 08/31/2017 at [facility]  Surgical findings revealed a thickened, matted, and scarred piece of distal small bowel close to the ileocecal valve.  She was discharged on 09/05/2017.  Pain markedly increased in intensity and imaging was performed on 09/11/2017 which revealed: Debris within the anterior abdominal wall incision concerning for infection versus packing material, s/p post ileo-colectomy with expected postoperative changes, mild colonic ileus, numerous pulmonary nodules highly concerning for metastatic disease, punctate nonobstructing nephrolithiasis.  Staples were removed and one was packed.  She was started on doxycycline.  Abdominal and pelvic CT without contrast on 09/11/2017 revealed debris within anterior abdominal wall incision concerning for infection, versus packing material.   She is s/p ileocolectomy with expected postoperative changes and mild colonic ileus.   There  were numerous pulmonary nodules highly concerning for metastatic disease and punctate nonobstructing nephrolithiasis. She was readmitted on 09/12/2017.  She describes the onset of lower abdominal pain on 09/09/2017.  Pain markedly increased in intensity on 09/11/2017.   Staples were removed and the wound packed.  She was started on doxycycline.  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   PET scan was ordered to evaluate enlarging lung nodules with concern for recurrent cervical cancer but scan was delayed due to insurance and need to be performed in [place].  Presented to ER on 12/03/2017 for abdominal pain and emesis.  Imaging concerning for worsening possible uterine infarct and she was admitted to hospital.  Pelvic MRI was unremarkable.  Remote scarring type changes of uterus thought to be possibly related to radiation.  She was discharged on 12/08/2017.  Underwent endoscopy and colonoscopy on 12/20/2017.    On 02/22/2018 she underwent laser ablation of condylomata around the anus and vagina under anesthesia with Dr. Johney Maine.   04/15/2018:  Chest, abdomen, and pelvis CT revealed innumerable (> 100) cavitary nodules scattered in the lungs, moderately enlarging compared to the 11/08/2017 PET-CT, suspicious for metastatic disease.  One index node in the RLL measures 1.0 x 1.1 cm (previously 0.6 x 0.6 cm).  There were no new nodules.  There was an ill-defined wall thickening in the rectosigmoid with surrounding stranding along fascia planes, probably sequela from prior radiation therapy.  There was multilevel lumbar  impingement due to spondylosis and degenerative disc disease.  There was heterogeneous enhancement in the uterus (some possibly from prior radiation therapy).   04/23/2018:  PET scan revealed numerous scattered solid and cavitary nodules in the lungs stable increased in size compared to the prior PET-CT from 11/08/2017. Largest nodule was 1.1 cm in the LUL (SUV 1.9).  These demonstrated low-grade metabolic activity up to a maximum SUV of about 2.3, increased from 11/08/2017.    Case was discussed at tumor board on 04/25/2018. Consensus to pursue CT-guided biopsy (05/06/18) which revealed: Metastatic adenocarcinoma, morphologically consistent with cervical adenocarcinoma.  She has history of chronic hepatitis C which is managed by GI.  Hepatitis C genotype is 2a/2c.  She receives B12 injections for history of B12 deficiency.  On 04/24/2018 she underwent left total knee replacement with Dr. [name]. --------------------------------------------------------   DIAGNOSIS: Cervical adenocarcinoma  STAGE: IV        ;GOALS: Palliative  CURRENT/MOST RECENT THERAPY : Keytruda    Malignant neoplasm of overlapping sites of cervix ([place])  01/29/2019 - 04/23/2019 Chemotherapy   The patient had PACLitaxel (TAXOL) 156 mg in sodium chloride 0.9 % 250 mL chemo infusion (</= 63m/m2), 80 mg/m2 = 156 mg, Intravenous,  Once, 3 of 4 cycles Dose modification: 65 mg/m2 (original dose 80 mg/m2, Cycle 1, Reason: Provider Judgment) Administration: 156 mg (01/29/2019), 156 mg (02/05/2019), 126 mg (02/12/2019), 126 mg (02/26/2019), 126 mg (03/13/2019), 126 mg (03/27/2019), 126 mg (03/20/2019), 126 mg (04/03/2019), 126 mg (04/10/2019)  for chemotherapy treatment.    05/08/2019 -  Chemotherapy   The patient had pembrolizumab (KEYTRUDA) 200 mg in sodium chloride 0.9 % 50 mL chemo infusion, 200 mg, Intravenous, Once, 2 of  6 cycles Administration: 200 mg (05/08/2019), 200 mg (05/29/2019)  for chemotherapy treatment.       HISTORY OF  PRESENTING ILLNESS:  Joanna Hall 53 y.o.  female with a history of adenocarcinoma the cervix with metastasis to the lung most recently started on Keytruda status post cycle #1 is here for follow-up.  Patient was admitted to hospital approximately 10 days post treatment for left shoulder pain/swelling.  Noted to have acute inflammation/arthritis-noninfectious.  Status post aspiration followed by steroid injection.  Currently no joint pain.  Again patient was recently evaluated in the emergency room for chest pain-CTA chest negative; continue to show enlarging lung nodules-largest about 16 mm in size.  Continues to have chronic abdominal pain.  Chronic diarrhea.  Chronic fatigue.  Review of Systems  Constitutional: Positive for malaise/fatigue. Negative for chills, diaphoresis, fever and weight loss.  HENT: Negative for nosebleeds and sore throat.   Eyes: Negative for double vision.  Respiratory: Negative for cough, hemoptysis, sputum production, shortness of breath and wheezing.   Cardiovascular: Negative for chest pain, palpitations, orthopnea and leg swelling.  Gastrointestinal: Positive for abdominal pain and diarrhea. Negative for blood in stool, constipation, heartburn, melena, nausea and vomiting.  Genitourinary: Negative for dysuria, frequency and urgency.  Musculoskeletal: Negative for back pain and joint pain.  Skin: Negative.  Negative for itching and rash.  Neurological: Positive for tingling. Negative for dizziness, focal weakness, weakness and headaches.  Endo/Heme/Allergies: Does not bruise/bleed easily.  Psychiatric/Behavioral: Negative for depression. The patient is not nervous/anxious and does not have insomnia.      MEDICAL HISTORY:  Past Medical History:  Diagnosis Date  . Abdominal pain 06/10/2018  . Abnormal cervical Papanicolaou smear 09/18/2017  . Anxiety   . Aortic atherosclerosis ([place])   . Arthritis    neck and knees  . Blood clots in brain    both lungs  and right kidney  . Blood transfusion without reported diagnosis   . Cervical cancer ([place]) 09/2016   mets lung  . Chronic anal fissure   . Chronic diarrhea   . Dyspnea   . Erosive gastropathy 09/18/2017  . Factor V Leiden mutation ([place])   . Fecal incontinence   . Genital warts   . GERD (gastroesophageal reflux disease)   . GI bleed 10/08/2018  . Heart murmur   . Hematochezia   . Hemorrhoids   . 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 Hepatitis, chronic ([place]) 05/05/2017  . History of cancer chemotherapy    completed 06/2017  . History of Clostridium difficile infection    while undergoing chemo.  Negative test 10/2017  . Ileocolic anastomotic leak   . 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 Intestinal infection due to Clostridium difficile 09/18/2017  . Macrocytic anemia with vitamin B12 deficiency   . Multiple gastric ulcers   . Nausea vomiting and diarrhea   . Pancolitis ([place]) 07/27/2018  . Perianal condylomata   . Pneumonia    History of  . Pulmonary nodules   . Rectal bleeding   . Small bowel obstruction ([place]) 08/2017  . Stiff neck    limited right turn  . Vitamin D deficiency     SURGICAL HISTORY: Past Surgical History:  Procedure Laterality Date  . CHOLECYSTECTOMY    . COLON SURGERY  08/2017   resection  . COLONOSCOPY WITH PROPOFOL N/A 12/20/2017   Procedure: COLONOSCOPY WITH PROPOFOL;  Surgeon: Sherri Sear  Reece Levy, MD;  Location: [facility];  Service: Gastroenterology;  Laterality: N/A;  . COLONOSCOPY WITH PROPOFOL N/A 07/30/2018   Procedure: COLONOSCOPY WITH PROPOFOL;  Surgeon: [name], MD;  Location: [facility] ENDOSCOPY;  Service: Gastroenterology;  Laterality: N/A;  . COLONOSCOPY WITH PROPOFOL N/A 10/10/2018   Procedure: COLONOSCOPY WITH PROPOFOL;  Surgeon: Lucilla Lame, MD;  Location: [facility] ENDOSCOPY;  Service: Endoscopy;  Laterality: N/A;  . DIAGNOSTIC LAPAROSCOPY    . ESOPHAGOGASTRODUODENOSCOPY (EGD) WITH  PROPOFOL N/A 12/20/2017   Procedure: ESOPHAGOGASTRODUODENOSCOPY (EGD) WITH PROPOFOL;  Surgeon: [name], MD;  Location: [facility];  Service: Gastroenterology;  Laterality: N/A;  . ESOPHAGOGASTRODUODENOSCOPY (EGD) WITH PROPOFOL  07/30/2018   Procedure: ESOPHAGOGASTRODUODENOSCOPY (EGD) WITH PROPOFOL;  Surgeon: [name], MD;  Location: [facility] ENDOSCOPY;  Service: Gastroenterology;;  . Otho Darner SIGMOIDOSCOPY N/A 11/21/2018   Procedure: FLEXIBLE SIGMOIDOSCOPY;  Surgeon: [name], MD;  Location: [facility] ENDOSCOPY;  Service: Gastroenterology;  Laterality: N/A;  . LAPAROTOMY N/A 08/31/2017   Procedure: EXPLORATORY LAPAROTOMY for SBO, ileocolectomy, removal of piece of uterine wall;  Surgeon: [name], MD;  Location: [facility] ORS;  Service: General;  Laterality: N/A;  . LASER ABLATION CONDOLAMATA N/A 02/22/2018   Procedure: LASER ABLATION/REMOVAL OF [id number] AROUND ANUS AND VAGINA;  Surgeon: Michael Boston, MD;  Location: [place];  Service: General;  Laterality: N/A;  . OOPHORECTOMY    . PORTA CATH INSERTION N/A 05/13/2018   Procedure: PORTA CATH INSERTION;  Surgeon: Katha Cabal, MD;  Location: [place] CV LAB;  Service: Cardiovascular;  Laterality: N/A;  . SMALL INTESTINE SURGERY    . TANDEM RING INSERTION     x3  . THORACOTOMY    . TOTAL KNEE ARTHROPLASTY Left 04/24/2018   Procedure: TOTAL KNEE ARTHROPLASTY;  Surgeon: Lovell Sheehan, MD;  Location: [facility] ORS;  Service: Orthopedics;  Laterality: Left;    SOCIAL HISTORY: Social History   Socioeconomic History  . Marital status: Divorced    Spouse name: Not on file  . Number of children: Not on file  . Years of education: Not on file  . Highest education level: Not on file  Occupational History  . Not on file  Social Needs  . Financial resource strain: Not hard at all  . Food insecurity    Worry: Never true    Inability: Never true  . Transportation needs    Medical: No     Non-medical: No  Tobacco Use  . Smoking status: Former Smoker    Quit date: 10/16/2006    Years since quitting: 12.6  . Smokeless tobacco: Never Used  Substance and Sexual Activity  . Alcohol use: Not Currently    Frequency: Never    Comment: seldom  . Drug use: Yes    Types: Marijuana    Comment: not very often  . Sexual activity: Not Currently    Birth control/protection: Post-menopausal    Comment: Not Asked  Lifestyle  . Physical activity    Days per week: Patient refused    Minutes per session: Patient refused  . Stress: Only a little  Relationships  . Social Herbalist on phone: Patient refused    Gets together: Patient refused    Attends religious service: Patient refused    Active member of club or organization: Patient refused    Attends meetings of clubs or organizations: Patient refused    Relationship status: Patient refused  . Intimate partner violence    Fear of current or ex  partner: No    Emotionally abused: No    Physically abused: No    Forced sexual activity: No  Other Topics Concern  . Not on file  Social History Narrative  . Not on file    FAMILY HISTORY: Family History  Problem Relation Age of Onset  . Hypertension Father   . Diabetes Father   . Alcohol abuse Daughter   . Hypertension Maternal Grandmother   . Diabetes Maternal Grandmother   . Diabetes Paternal Grandmother   . Hypertension Paternal Grandmother     ALLERGIES:  is allergic to ketamine.  MEDICATIONS:  Current Outpatient Medications  Medication Sig Dispense Refill  . amitriptyline (ELAVIL) 75 MG tablet Take 1 tablet (75 mg total) by mouth at bedtime. 90 tablet 1  . Calcium Carb-Cholecalciferol (CALCIUM 500 +D) 500-400 MG-UNIT TABS Take 2 tablets by mouth daily.    . diphenoxylate-atropine (LOMOTIL) 2.5-0.025 MG tablet TAKE (2) TABLETS FOUR TIMES DAILY. 240 tablet 0  . feeding supplement, ENSURE ENLIVE, (ENSURE ENLIVE) LIQD Take 237 mLs by mouth 3 (three) times daily  between meals. 237 mL 12  . hydrOXYzine (ATARAX/VISTARIL) 10 MG tablet Take 1 tablet (10 mg total) by mouth 3 (three) times daily as needed. 60 tablet 0  . hyoscyamine (LEVBID) 0.375 MG 12 hr tablet Take 1 tablet (0.375 mg total)by mouth 2 (two) times daily. 60 tablet 0  . meclizine (ANTIVERT) 25 MG tablet Take 1 tablet (25 mg total) by mouth 2 (two) times daily as needed for dizziness. 15 tablet 0  . Multiple Vitamins-Minerals (MULTIVITAMIN WITH MINERALS) tablet Take 1 tablet by mouth daily. 30 tablet 0  . ondansetron (ZOFRAN ODT) 4 MG disintegrating tablet Take 1 tablet (4 mg total) by mouth every 8 (eight) hours as needed for nausea or vomiting. 30 tablet 2  . oxyCODONE (OXYCONTIN) 15 mg 12 hr tablet Take 1 tablet (15 mg total) by mouth every 12 (twelve) hours. 60 tablet 0  . Oxycodone HCl 10 MG TABS Take 1 tablet (10 mg total) by mouth every 6 (six) hours as needed (for breakthrough pain). 120 tablet 0  . pantoprazole (PROTONIX) 40 MG tablet Take 1 tablet (40 mg total) by mouth daily. 90 tablet 1  . potassium chloride SA (K-DUR) 20 MEQ tablet Take 1 tablet (20 mEq total) by mouth 2 (two) times daily. 30 tablet 0  . promethazine (PHENERGAN) 25 MG tablet Take 1 tablet (25 mg total) by mouth every 8 (eight) hours as needed for nausea or vomiting. 30 tablet 3  . rivaroxaban (XARELTO) 20 MG TABS tablet Take 1 tablet (20 mg total) by mouth daily with supper. (Patient taking differently: Take 20 mg by mouth daily with supper. Takes in the morning) 30 tablet 6  . VENTOLIN HFA 108 (90 Base) MCG/ACT inhaler Inhale 1-2 puffs into the lungs every 4 (four) hours as needed for shortness of breath. 1 Inhaler 1  . famotidine (PEPCID) 20 MG tablet Take 1 tablet (20 mg total) by mouth 2 (two) times daily for 5 days. 10 tablet 0  . sucralfate (CARAFATE) 1 g tablet Take 1 tablet (1 g total) by mouth 4 (four) times daily for 5 days. 20 tablet 0   No current facility-administered medications for this visit.     Facility-Administered Medications Ordered in Other Visits  Medication Dose Route Frequency Provider Last Rate Last Dose  . heparin lock flush 100 unit/mL  500 Units Intravenous Once Corcoran, Drue Second, MD      . sodium chloride  0.9 % 1,000 mL with potassium chloride 20 mEq infusion   Intravenous Once Honor Loh E, NP      . sodium chloride flush (NS) 0.9 % injection 10 mL  10 mL Intravenous Once Borders, Kirt Boys, NP          .  PHYSICAL EXAMINATION: ECOG PERFORMANCE STATUS: 1 - Symptomatic but completely ambulatory  Vitals:   05/29/19 0859  BP: (!) 136/98  Pulse: 76  Resp: 18  Temp: (!) 96.5 F (35.8 C)   Filed Weights   05/29/19 0859  Weight: 166 lb 12.8 oz (75.7 kg)    Physical Exam  Constitutional: She is oriented to person, place, and time and well-developed, well-nourished, and in no distress.  Alone.  Walking herself.   HENT:  Head: Normocephalic and atraumatic.  Mouth/Throat: Oropharynx is clear and moist. No oropharyngeal exudate.  Eyes: Pupils are equal, round, and reactive to light.  Neck: Normal range of motion. Neck supple.  Cardiovascular: Normal rate and regular rhythm.  Pulmonary/Chest: Effort normal and breath sounds normal. No respiratory distress. She has no wheezes.  Abdominal: Soft. Bowel sounds are normal. She exhibits no distension and no mass. There is no rebound and no guarding.  Musculoskeletal: Normal range of motion.        General: No tenderness or edema.  Neurological: She is alert and oriented to person, place, and time.  Skin: Skin is warm.  Psychiatric: Affect normal.     LABORATORY DATA:  I have reviewed the data as listed Lab Results  Component Value Date   WBC 4.4 05/29/2019   HGB 11.1 (L) 05/29/2019   HCT 33.6 (L) 05/29/2019   MCV 100.9 (H) 05/29/2019   PLT 205 05/29/2019   Recent Labs    06/11/18 0510  07/08/18 1035  10/21/18 0845  05/18/19 0935  05/21/19 0539 05/23/19 1219 05/29/19 0830  NA 135   < > 136   <  > 134*   < > 136   < > 134* 138 136  K 3.9   < > 4.2   < > 3.9   < > 3.6   < > 4.0 3.8 3.9  CL 102   < > 107   < > 98   < > 101   < > 99 101 105  CO2 27   < > 22   < > 24   < > 25   < > 26 28 23   GLUCOSE 82   < > 119*   < > 111*   < > 101*   < > 99 95 98  BUN 11   < > 15   < > 17   < > 11   < > 10 14 17   CREATININE 0.63   < > 0.71   < > 0.91   < > 0.69   < > 0.67 0.70 0.68  CALCIUM 8.9   < > 8.7*   < > 9.6   < > 9.0   < > 9.0 9.9 9.0  GFRNONAA >60   < > >60   < > >60   < > >60   < > >60 >60 >60  GFRAA >60   < > >60   < > >60   < > >60   < > >60 >60 >60  PROT 7.0   < > 6.5   < > 8.6*   < > 7.7  --   --  8.6* 7.1  ALBUMIN  3.5   < > 3.4*   < > 4.4   < > 3.8  --   --  3.8 3.4*  AST 41   < > 26   < > 48*   < > 37  --   --  52* 36  ALT 51*   < > 33   < > 67*   < > 51*  --   --  65* 54*  ALKPHOS 171*   < > 132*   < > 185*   < > 181*  --   --  237* 178*  BILITOT 0.7   < > 0.4   < > 1.4*   < > 0.7  --   --  0.8 0.4  BILIDIR 0.1  --  <0.1  --  0.3*  --   --   --   --   --   --   IBILI 0.6  --  NOT CALCULATED  --   --   --   --   --   --   --   --    < > = values in this interval not displayed.    RADIOGRAPHIC STUDIES: I have personally reviewed the radiological images as listed and agreed with the findings in the report. Ct Head W Or Wo Contrast  Result Date: 05/18/2019 CLINICAL DATA:  53 year old female with history of metastatic cervical cancer. Severe pain radiating from the skull base through the neck and left arm. EXAM: CT HEAD WITHOUT AND WITH CONTRAST CT NECK WITH CONTRAST TECHNIQUE: Contiguous axial images were obtained from the base of the skull through the vertex without and with intravenous contrast. Multidetector CT imaging of the and neck was performed using the standard protocol following the bolus administration of intravenous contrast. CONTRAST:  54m OMNIPAQUE IOHEXOL 300 MG/ML  SOLN COMPARISON:  Head CT without contrast 01/26/2019. Chest CT today reported separately. FINDINGS: CT HEAD  FINDINGS Brain: Stable and normal cerebral volume. No midline shift, ventriculomegaly, mass effect, evidence of mass lesion, intracranial hemorrhage or evidence of cortically based acute infarction. Gray-white matter differentiation is within normal limits throughout the brain. No abnormal enhancement identified. Vascular: The major intracranial vascular structures are enhancing as expected. Skull: Stable, negative. Sinuses/Orbits: Visualized paranasal sinuses and mastoids are stable and well pneumatized. Other: Visualized orbits and scalp soft tissues are within normal limits. CT NECK FINDINGS Pharynx and larynx: Laryngeal and pharyngeal soft tissue contours are within normal limits. Negative parapharyngeal and retropharyngeal spaces. Salivary glands: Negative sublingual space. Submandibular and parotid glands appear symmetric and normal. Thyroid: Negative. Lymph nodes: Negative.  No cervical lymphadenopathy. Vascular: Right IJ approach porta cath. The major vascular structures in the neck and at the skull base are patent. Mastoids and visualized paranasal sinuses: Clear. Skeleton: Absent dentition. Widespread advanced cervical disc and endplate degeneration. Upper cervical facet degeneration with mild degenerative appearing anterolisthesis of C3 on C4. Degenerative appearing bone mineralization heterogeneity in the odontoid. No acute or suspicious osseous lesion identified at the skull base or neck. Upper chest: Reported separately today. IMPRESSION: 1. No metastatic disease identified in the head or neck. 2.  Normal CT appearance of the brain. 3. Negative neck CT aside from widespread cervical spine degeneration. 4.  CT Chest today reported separately. Electronically Signed   By: HGenevie AnnM.D.   On: 05/18/2019 14:42   Ct Soft Tissue Neck W Contrast  Result Date: 05/18/2019 CLINICAL DATA:  53year old female with history of metastatic cervical cancer. Severe pain  radiating from the skull base through the neck  and left arm. EXAM: CT HEAD WITHOUT AND WITH CONTRAST CT NECK WITH CONTRAST TECHNIQUE: Contiguous axial images were obtained from the base of the skull through the vertex without and with intravenous contrast. Multidetector CT imaging of the and neck was performed using the standard protocol following the bolus administration of intravenous contrast. CONTRAST:  55m OMNIPAQUE IOHEXOL 300 MG/ML  SOLN COMPARISON:  Head CT without contrast 01/26/2019. Chest CT today reported separately. FINDINGS: CT HEAD FINDINGS Brain: Stable and normal cerebral volume. No midline shift, ventriculomegaly, mass effect, evidence of mass lesion, intracranial hemorrhage or evidence of cortically based acute infarction. Gray-white matter differentiation is within normal limits throughout the brain. No abnormal enhancement identified. Vascular: The major intracranial vascular structures are enhancing as expected. Skull: Stable, negative. Sinuses/Orbits: Visualized paranasal sinuses and mastoids are stable and well pneumatized. Other: Visualized orbits and scalp soft tissues are within normal limits. CT NECK FINDINGS Pharynx and larynx: Laryngeal and pharyngeal soft tissue contours are within normal limits. Negative parapharyngeal and retropharyngeal spaces. Salivary glands: Negative sublingual space. Submandibular and parotid glands appear symmetric and normal. Thyroid: Negative. Lymph nodes: Negative.  No cervical lymphadenopathy. Vascular: Right IJ approach porta cath. The major vascular structures in the neck and at the skull base are patent. Mastoids and visualized paranasal sinuses: Clear. Skeleton: Absent dentition. Widespread advanced cervical disc and endplate degeneration. Upper cervical facet degeneration with mild degenerative appearing anterolisthesis of C3 on C4. Degenerative appearing bone mineralization heterogeneity in the odontoid. No acute or suspicious osseous lesion identified at the skull base or neck. Upper chest:  Reported separately today. IMPRESSION: 1. No metastatic disease identified in the head or neck. 2.  Normal CT appearance of the brain. 3. Negative neck CT aside from widespread cervical spine degeneration. 4.  CT Chest today reported separately. Electronically Signed   By: HGenevie AnnM.D.   On: 05/18/2019 14:42   Ct Chest W Contrast  Result Date: 05/18/2019 CLINICAL DATA:  Hx of cervical cancer with mets to the lungs. Severe pain from base of skull to chest and left arm. Concern for further mets. EXAM: CT CHEST WITH CONTRAST TECHNIQUE: Multidetector CT imaging of the chest was performed during intravenous contrast administration. CONTRAST:  780mOMNIPAQUE IOHEXOL 300 MG/ML  SOLN COMPARISON:  04/21/2019 FINDINGS: Cardiovascular: Heart is normal in size and configuration. No pericardial effusion. Great vessels are normal in caliber. Minor aortic atherosclerosis. No dissection. Arch branch vessels are widely patent. Mediastinum/Nodes: Visualized thyroid gland is unremarkable. No neck base or axillary masses or enlarged lymph nodes. No mediastinal or hilar masses or adenopathy. Trachea and esophagus are unremarkable. Right internal jugular Port-A-Cath tip projects in the right atrium. Lungs/Pleura: Numerous bilateral metastatic pulmonary nodules with an increase in number and size compared to the prior CT. Largest nodule lies at the base of the right lower lobe, image 37, series 9, 14 mm, previously 12 mm. Nodule in the posteromedial base of the left lower lobe, image 44, measures 10 mm, previously 7 mm. Multiple nodules shows central cavitation. No evidence of pneumonia or pulmonary edema. No pleural effusion or pneumothorax. Upper Abdomen: No acute abnormality. Musculoskeletal: No fracture or acute finding. No osteoblastic or osteolytic lesions. IMPRESSION: 1. No acute findings in the chest. 2. Progression of metastatic disease to the lungs with an increase in the size and number of metastatic lung nodules. No other  evidence of metastatic disease in the chest. Aortic Atherosclerosis (ICD10-I70.0). Electronically Signed   By:  Lajean Manes M.D.   On: 05/18/2019 14:47   Ct Angio Chest Pe W And/or Wo Contrast  Result Date: 05/23/2019 CLINICAL DATA:  Chest pain.  Metastatic cervical carcinoma EXAM: CT ANGIOGRAPHY CHEST WITH CONTRAST TECHNIQUE: Multidetector CT imaging of the chest was performed using the standard protocol during bolus administration of intravenous contrast. Multiplanar CT image reconstructions and MIPs were obtained to evaluate the vascular anatomy. CONTRAST:  48m OMNIPAQUE IOHEXOL 350 MG/ML SOLN COMPARISON:  Chest CT May 18, 2019 FINDINGS: Cardiovascular: There is no demonstrable pulmonary embolus. There is no thoracic aortic aneurysm or dissection. There is calcification at the origin of the right innominate artery. Other visualized great vessels appear unremarkable. There are scattered foci of aortic atherosclerosis. There is no pericardial effusion or pericardial thickening evident. Port-A-Cath tip near cavoatrial junction. Mediastinum/Nodes: Visualized thyroid appears unremarkable. There is no appreciable thoracic adenopathy. No esophageal lesions evident. Lungs/Pleura: There are nodular metastases throughout the lungs bilaterally, several of which are cavitated, stable. Nodular opacities range in size from as small as 2 mm to as large as 1.6 cm. There is no edema or consolidation. No pleural effusions. Upper Abdomen: Gallbladder absent. Upper abdominal structures otherwise appear unremarkable. Musculoskeletal: No blastic or lytic bone lesions. No chest wall lesions evident. Note that the port is present anteriorly on the right, superficial to the pectoralis muscles. Review of the MIP images confirms the above findings. IMPRESSION: 1. No demonstrable pulmonary embolus. No thoracic aortic aneurysm or dissection. Foci of aortic atherosclerosis noted. 2. Nodular lesions throughout the lungs consistent with  metastatic disease. Multiple parenchymal lung metastases show cavitation. Largest metastatic focus measures 1.6 cm in size. 3.  No evident adenopathy. 4.  Gallbladder absent. Aortic Atherosclerosis (ICD10-I70.0). Electronically Signed   By: WLowella GripIII M.D.   On: 05/23/2019 14:07   Mr Cervical Spine Wo Contrast  Result Date: 05/18/2019 CLINICAL DATA:  LEFT neck and shoulder pain. Pain is severe, for 3 days. Unable to lift LEFT arm. EXAM: MRI CERVICAL SPINE WITHOUT CONTRAST TECHNIQUE: Multiplanar, multisequence MR imaging of the cervical spine was performed. No intravenous contrast was administered. COMPARISON:  CT of the neck earlier today. FINDINGS: The patient was unable to remain motionless for the exam. Small or subtle lesions could be overlooked. Alignment: Straightening, and slight reversal of the normal cervical lordosis. Trace anterolisthesis C2-3, C3-4, and C4-5 are facet mediated. Vertebrae: Endplate reactive changes, C3 through C7. No visible metastatic disease. Cord: Limited assessment, no definite impingement or abnormal signal. Posterior Fossa, vertebral arteries, paraspinal tissues: No tonsillar herniation. Vertebral flow voids are maintained. No neck masses. Disc levels: C2-3: Mild degenerative anterolisthesis. Annular bulge. No impingement. C3-4: Disc space narrowing. 2 mm degenerative anterolisthesis. Bulky RIGHT facet arthrosis. BILATERAL RIGHT greater than LEFT C4 foraminal narrowing. C4-5: Disc space narrowing with osseous spurring. 1-2 mm anterolisthesis. Bulky RIGHT facet arthropathy. RIGHT greater than LEFT C5 foraminal narrowing. C5-6: Disc space narrowing with osseous spurring. Mild stenosis. Shallow central protrusion. BILATERAL C6 foraminal narrowing due to uncinate spurring. C6-7: Disc space narrowing. Osseous spurring. Shallow central protrusion. Effacement anterior subarachnoid space. Borderline BILATERAL C7 foraminal narrowing. C7-T1: Unremarkable. IMPRESSION: Multilevel  spondylosis as described. BILATERAL foraminal narrowing at C5-6 and C6-7 could affect the RIGHT or LEFT exiting nerve roots at those levels, but there is no clearly asymmetric or predominant LEFT-sided compressive lesion. Within limits for detection on this motion degraded exam, no metastatic disease, epidural hematoma, significant spinal stenosis, or dominant/lateralizing disc protrusion. Electronically Signed   By: JStaci Righter  M.D.   On: 05/18/2019 21:25   US Venous Img Upper Uni Left  Result Date: 05/18/2019 CLINICAL DATA:  Pain and swelling for 4 days. EXAM: LEFT UPPER EXTREMITY VENOUS DOPPLER ULTRASOUND TECHNIQUE: Gray-scale sonography with graded compression, as well as color Doppler and duplex ultrasound were performed to evaluate the upper extremity deep venous system from the level of the subclavian vein and including the jugular, axillary, basilic, radial, ulnar and upper cephalic vein. Spectral Doppler was utilized to evaluate flow at rest and with distal augmentation maneuvers. COMPARISON:  None. FINDINGS: Contralateral Subclavian Vein: Respiratory phasicity is normal and symmetric with the symptomatic side. No evidence of thrombus. Normal compressibility. Internal Jugular Vein: No evidence of thrombus. Normal compressibility, respiratory phasicity and response to augmentation. Subclavian Vein: No evidence of thrombus. Normal compressibility, respiratory phasicity and response to augmentation. Axillary Vein: No evidence of thrombus. Normal compressibility, respiratory phasicity and response to augmentation. Cephalic Vein: No evidence of thrombus. Normal compressibility, respiratory phasicity and response to augmentation. Basilic Vein: No evidence of thrombus. Normal compressibility, respiratory phasicity and response to augmentation. Brachial Veins: No evidence of thrombus. Normal compressibility, respiratory phasicity and response to augmentation. Radial Veins: No evidence of thrombus. Normal  compressibility, respiratory phasicity and response to augmentation. Ulnar Veins: No evidence of thrombus. Normal compressibility, respiratory phasicity and response to augmentation. Venous Reflux:  None visualized. Other Findings:  None visualized. IMPRESSION: No evidence of DVT within the left upper extremity. Electronically Signed   By: Lovey Newcomer M.D.   On: 05/18/2019 10:40   Dg Shoulder Left  Result Date: 05/19/2019 CLINICAL DATA:  Left shoulder pain.  No known injury. EXAM: LEFT SHOULDER - 2+ VIEW COMPARISON:  Chest CT 05/18/2019 FINDINGS: No fracture or dislocation is identified. No obvious bone lesions. Small calcifications are noted near the acromion which could be unfused ossification centers or insertional deltoid ligament calcific tendinopathy. Diffuse pulmonary metastatic disease is noted. IMPRESSION: No acute bony findings or obvious metastatic bone lesions. Numerous pulmonary metastatic lesions. Electronically Signed   By: Marijo Sanes M.D.   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 [place]- ? Immune related arthtitis [see below]  # proceed with Keytruda cycle #2 Labs today reviewed;  acceptable for treatment today.   # Left effusion/inflammatory arthritis-status post aspiration negative for infection.  She is status post steroid injection.  Improved.  Discussed that it is possible side effect from Keytruda/but again unusual to happen just after 1 treatment [approximately 10 days].  Given progressive lung disease/poor tolerance to chemotherapy, discussed that KB[place]le Flockwould be continued.  Patient understands the risk versus benefits [place]  Will  monitor closely.  #Chronic diarrhea- worse/quality of life issue-"short bowel"discussed with GI re: colostomy; awaiting evaluation with surgery in 1st week of aug.   #Peripheral neuropathy-stable  #Hypomagnesemia-secondary platinum chemotherapy-mag 1.5; IV mag today.  Stable  #Chronic right lower quadrant abdominal pain/scar tissue versus others-on oxycodone stable new prescription given.  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   # DISPOSITION:  # Keytruda/Mag today. # 1 weeks- mag level; IVFs over 1 hour /Mag2h infusion # 2 weeks-  mag level; IVFs over 1  hour /Mag2h infusion # follow up in 3 weeks-MD-Keytruda /cbc/cmp/mag/; Mag 4 gm/2 hour- Dr.B    All questions were answered. The patient knows to call the clinic with any problems, questions or concerns.     Cammie Sickle, MD 06/01/2019 7:34 PM

## 2019-05-30 ENCOUNTER — Ambulatory Visit (INDEPENDENT_AMBULATORY_CARE_PROVIDER_SITE_OTHER): Payer: Medicaid Other | Admitting: Nurse Practitioner

## 2019-05-30 ENCOUNTER — Encounter: Payer: Self-pay | Admitting: Nurse Practitioner

## 2019-05-30 VITALS — BP 98/60 | HR 78 | Temp 96.9°F | Resp 16 | Ht 62.0 in | Wt 163.3 lb

## 2019-05-30 DIAGNOSIS — I5032 Chronic diastolic (congestive) heart failure: Secondary | ICD-10-CM | POA: Insufficient documentation

## 2019-05-30 DIAGNOSIS — Z09 Encounter for follow-up examination after completed treatment for conditions other than malignant neoplasm: Secondary | ICD-10-CM

## 2019-05-30 DIAGNOSIS — E2740 Unspecified adrenocortical insufficiency: Secondary | ICD-10-CM

## 2019-05-30 DIAGNOSIS — R0789 Other chest pain: Secondary | ICD-10-CM

## 2019-05-30 DIAGNOSIS — C538 Malignant neoplasm of overlapping sites of cervix uteri: Secondary | ICD-10-CM

## 2019-05-30 DIAGNOSIS — N280 Ischemia and infarction of kidney: Secondary | ICD-10-CM | POA: Insufficient documentation

## 2019-05-30 DIAGNOSIS — I700 Atherosclerosis of aorta: Secondary | ICD-10-CM

## 2019-05-30 NOTE — Progress Notes (Signed)
Name: Joanna Hall   MRN: [id number]    DOB: [birth year]   Date:05/30/2019       Progress Note  Subjective  Chief Complaint  Chief Complaint  Patient presents with  . Hospitalization Follow-up    chest pain, dx infection    HPI  Patient presents for hospital follow-up she was admitted to [facility] on 8/3 and discharged on 8/6 2020 for chest pain, left arm pain and neck pain.  She is admitted to medical unit started on IV pain medications and IV antibiotics due to concern for septic arthritis.  Arthrocentesis was completed with negative fluid cultures.  She was seen by orthopedic Dr. Sabra Heck during hospitalization who recommended arm sling and ice.  Patient does have metastatic cervical cancer and follows up with Dr. Oneita Jolly outpatient.  She received a steroid injection with some improvement of shoulder pain and is taking chronic narcotics for cancer pain.  On 05/23/2019 she went to [facility] ER for atypical chest pain again.  Her chest pain was worse with movement, palpation and deep inspiration.  She was also having chronic nausea and vomiting.  Patient had normal CT angiogram, negative troponins x2.  Resolution of pain with GI cocktail.  Provided an acids to go home with.  She followed up with Dr. Ignatius Specking, oncology yesterday plan to continue Keytruda- immunotherpay and had mag infusions- goal of therapies is palliative.   Left shoulder pain improved moderate and significant increase in range of motion, but after she got her immunotherpy yesterday states left shoulder pain worsened and she started to have nausea, vomiting and diarrhea. She has chronic and n/v/d. Has appointment with general surgery for colostomy- 06/16/2019. Is going to be seeing Dr. Sabra Heck, orthopedic, in the next 2 weeks. States orthopedic wanted to do surgery surgery.    Chronic diastolic congestive heart failure  EF: 36-14%; grade 1 diastolic dysfunction. Blood pressure stable, euvolemic. noted aortic atherosclerosis on imaging,  Last LDL was 28,  BP Readings from Last 3 Encounters:  05/30/19 98/60  05/29/19 (!) 136/98  05/23/19 128/69     Unspecified adrenocortical insufficiency  Referred to [place] [facility], but has not gone yet.   PHQ2/9: Depression screen PHQ 2/9 05/30/2019  Decreased Interest 0  Down, Depressed, Hopeless 0  PHQ - 2 Score 0  Altered sleeping 0  Tired, decreased energy 0  Change in appetite 0  Feeling bad or failure about yourself  0  Trouble concentrating 0  Moving slowly or fidgety/restless 0  Suicidal thoughts 0  PHQ-9 Score 0  Difficult doing work/chores Not difficult at all  Some recent data might be hidden    PHQ reviewed. Negative  Patient Active Problem List   Diagnosis Date Noted  . Chronic diastolic congestive heart failure ([place]) 05/30/2019  . Chest pain in adult 05/19/2019  . Intractable pain 05/19/2019  . Cancer associated pain   . Proctitis, radiation   . Palliative care encounter   . Encounter for monitoring opioid maintenance therapy 11/19/2018  . Adrenal insufficiency ([place]) 09/03/2018  . Chronic diarrhea 06/25/2018  . Chronic anticoagulation 06/25/2018  . Vomiting   . Encounter for antineoplastic chemotherapy 06/04/2018  . 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 Lung metastasis ([place]) 05/15/2018  . Closed fracture of distal end of radius 05/04/2018  . History of total knee arthroplasty 04/24/2018  . Osteoarthritis of left knee 02/28/2018  . Condyloma acuminatum of anus s/p ablation 02/22/2018 02/22/2018  . Condyloma acuminatum of vagina s/p ablation 02/22/2018 02/22/2018  . Positive ANA (antinuclear  antibody) 02/04/2018  . Aortic atherosclerosis ([place]) 01/15/2018  . Elevated MCV 01/15/2018  . Anemia 01/15/2018  . Genital warts 01/15/2018  . Vitamin D deficiency 01/15/2018  . Pernicious anemia   . Impingement syndrome of shoulder region 12/19/2017  . Multiple joint pain 12/19/2017  . Osteoarthritis of right knee 12/19/2017  . B12 deficiency  12/11/2017  . Lung nodules 12/11/2017  . History of Clostridium difficile infection 10/23/2017  . Factor V Leiden ([place]) 10/19/2017  . Goals of care, counseling/discussion 10/19/2017  . Cervical cancer, FIGO stage IB1 ([place]) 09/18/2017  . Chronic hepatitis C without hepatic coma ([place]) 09/18/2017  . Cytopenia 09/18/2017  . Diarrhea 09/18/2017  . Lower abdominal pain 09/18/2017  . 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 Malignant neoplasm of overlapping sites of cervix ([place]) 09/18/2017  . Renal insufficiency 09/18/2017  . Wound infection after surgery 09/12/2017  . Hypokalemia   . Hypomagnesemia   . Cervical arthritis 07/18/2017  . Dysuria 06/20/2017  . Metastatic cancer ([place]) 05/12/2017  . Essential hypertension 03/15/2017  . Anemia in other chronic diseases classified elsewhere 03/01/2017  . Chemotherapy-induced neutropenia ([place]) 01/29/2017  . 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[place]) 09/25/2016    Past Medical History:  Diagnosis Date  . Abdominal pain 06/10/2018  . Abnormal cervical Papanicolaou smear 09/18/2017  . Anxiety   . Aortic atherosclerosis ([place])   . Arthritis    neck and knees  . 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 Blood transfusion without reported diagnosis   . Cervical cancer ([place]) 09/2016   mets lung  . Chronic anal fissure   . Chronic diarrhea   . Dyspnea   . Erosive gastropathy 09/18/2017  . Factor V Leiden mutation ([place])   . Fecal incontinence   . Genital warts   . GERD (gastroesophageal reflux disease)   . GI bleed 10/08/2018  . Heart murmur   . Hematochezia   . Hemorrhoids   . 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 Hepatitis, chronic ([place]) 05/05/2017  . History of cancer chemotherapy    completed 06/2017  . History of Clostridium difficile infection    while undergoing chemo.  Negative test 10/2017  . Ileocolic anastomotic leak   . Infarction of kidney ([place]) left kidney   and uterus  . Intestinal infection  due to Clostridium difficile 09/18/2017  . Macrocytic anemia with vitamin B12 deficiency   . Multiple gastric ulcers   . Nausea vomiting and diarrhea   . Pancolitis ([place]) 07/27/2018  . Perianal condylomata   . Pneumonia    History of  . Pulmonary nodules   . Rectal bleeding   . Small bowel obstruction ([place]) 08/2017  . Stiff neck    limited right turn  . Vitamin D deficiency     Past Surgical History:  Procedure Laterality Date  . CHOLECYSTECTOMY    . COLON SURGERY  08/2017   resection  . COLONOSCOPY WITH PROPOFOL N/A 12/20/2017   Procedure: COLONOSCOPY WITH PROPOFOL;  Surgeon: [name], MD;  Location: [facility] ENDOSCOPY;  Service: Gastroenterology;  Laterality: N/A;  . COLONOSCOPY WITH PROPOFOL N/A 07/30/2018   Procedure: COLONOSCOPY WITH PROPOFOL;  Surgeon: [name], MD;  Location: [facility] ENDOSCOPY;  Service: Gastroenterology;  Laterality: N/A;  . COLONOSCOPY WITH PROPOFOL N/A 10/10/2018   Procedure: COLONOSCOPY WITH PROPOFOL;  Surgeon: Lucilla Lame, MD;  Location[facility] ENDOSCOPY;  Service: Endoscopy;  Laterality: N/A;  . DIAGNOSTIC LAPAROSCOPY    .  ESOPHAGOGASTRODUODENOSCOPY (EGD) WITH PROPOFOL N/A 12/20/2017   Procedure: ESOPHAGOGASTRODUODENOSCOPY (EGD) WITH PROPOFOL;  Surgeon: [name], MD;  Location: [place];  Service: Gastroenterology;  Laterality: N/A;  . ESOPHAGOGASTRODUODENOSCOPY (EGD) WITH PROPOFOL  07/30/2018   Procedure: ESOPHAGOGASTRODUODENOSCOPY (EGD) WITH PROPOFOL;  Surgeon: [name], MD;  Location: [facility] ENDOSCOPY;  Service: Gastroenterology;;  . Otho Darner SIGMOIDOSCOPY N/A 11/21/2018   Procedure: FLEXIBLE SIGMOIDOSCOPY;  Surgeon: [name], MD;  Location: [facility] ENDOSCOPY;  Service: Gastroenterology;  Laterality: N/A;  . LAPAROTOMY N/A 08/31/2017   Procedure: EXPLORATORY LAPAROTOMY for SBO, ileocolectomy, removal of piece of uterine wall;  Surgeon: [name], MD;  Location: [facility] ORS;  Service: General;  Laterality:  N/A;  . LASER ABLATION CONDOLAMATA N/A 02/22/2018   Procedure: LASER ABLATION/REMOVAL OF FTDDUKGURKY AROUND ANUS AND VAGINA;  Surgeon: Michael Boston, MD;  Location: [place];  Service: General;  Laterality: N/A;  . OOPHORECTOMY    . PORTA CATH INSERTION N/A 05/13/2018   Procedure: PORTA CATH INSERTION;  Surgeon: Katha Cabal, MD;  Location: [place] CV LAB;  Service: Cardiovascular;  Laterality: N/A;  . SMALL INTESTINE SURGERY    . TANDEM RING INSERTION     x3  . THORACOTOMY    . TOTAL KNEE ARTHROPLASTY Left 04/24/2018   Procedure: TOTAL KNEE ARTHROPLASTY;  Surgeon: Lovell Sheehan, MD;  Location: [facility] ORS;  Service: Orthopedics;  Laterality: Left;    Social History   Tobacco Use  . Smoking status: Former Smoker    Quit date: 10/16/2006    Years since quitting: 12.6  . Smokeless tobacco: Never Used  Substance Use Topics  . Alcohol use: Not Currently    Frequency: Never    Comment: seldom     Current Outpatient Medications:  .  amitriptyline (ELAVIL) 75 MG tablet, Take 1 tablet (75 mg total) by mouth at bedtime., Disp: 90 tablet, Rfl: 1 .  Calcium Carb-Cholecalciferol (CALCIUM 500 +D) 500-400 MG-UNIT TABS, Take 2 tablets by mouth daily., Disp: , Rfl:  .  diphenoxylate-atropine (LOMOTIL) 2.5-0.025 MG tablet, TAKE (2) TABLETS FOUR TIMES DAILY., Disp: 240 tablet, Rfl: 0 .  famotidine (PEPCID) 20 MG tablet, Take 1 tablet (20 mg total) by mouth 2 (two) times daily for 5 days., Disp: 10 tablet, Rfl: 0 .  feeding supplement, ENSURE ENLIVE, (ENSURE ENLIVE) LIQD, Take 237 mLs by mouth 3 (three) times daily between meals., Disp: 237 mL, Rfl: 12 .  hydrOXYzine (ATARAX/VISTARIL) 10 MG tablet, Take 1 tablet (10 mg total) by mouth 3 (three) times daily as needed., Disp: 60 tablet, Rfl: 0 .  hyoscyamine (LEVBID) 0.375 MG 12 hr tablet, Take 1 tablet (0.375 mg total)by mouth 2 (two) times daily., Disp: 60 tablet, Rfl: 0 .  meclizine (ANTIVERT) 25 MG tablet, Take 1 tablet (25  mg total) by mouth 2 (two) times daily as needed for dizziness., Disp: 15 tablet, Rfl: 0 .  Multiple Vitamins-Minerals (MULTIVITAMIN WITH MINERALS) tablet, Take 1 tablet by mouth daily., Disp: 30 tablet, Rfl: 0 .  ondansetron (ZOFRAN ODT) 4 MG disintegrating tablet, Take 1 tablet (4 mg total) by mouth every 8 (eight) hours as needed for nausea or vomiting., Disp: 30 tablet, Rfl: 2 .  oxyCODONE (OXYCONTIN) 15 mg 12 hr tablet, Take 1 tablet (15 mg total) by mouth every 12 (twelve) hours., Disp: 60 tablet, Rfl: 0 .  Oxycodone HCl 10 MG TABS, Take 1 tablet (10 mg total) by mouth every 6 (six) hours as needed (for breakthrough pain)., Disp: 120 tablet, Rfl:  0 .  pantoprazole (PROTONIX) 40 MG tablet, Take 1 tablet (40 mg total) by mouth daily., Disp: 90 tablet, Rfl: 1 .  potassium chloride SA (K-DUR) 20 MEQ tablet, Take 1 tablet (20 mEq total) by mouth 2 (two) times daily., Disp: 30 tablet, Rfl: 0 .  promethazine (PHENERGAN) 25 MG tablet, Take 1 tablet (25 mg total) by mouth every 8 (eight) hours as needed for nausea or vomiting., Disp: 30 tablet, Rfl: 3 .  rivaroxaban (XARELTO) 20 MG TABS tablet, Take 1 tablet (20 mg total) by mouth daily with supper. (Patient taking differently: Take 20 mg by mouth daily with supper. Takes in the morning), Disp: 30 tablet, Rfl: 6 .  sucralfate (CARAFATE) 1 g tablet, Take 1 tablet (1 g total) by mouth 4 (four) times daily for 5 days., Disp: 20 tablet, Rfl: 0 .  VENTOLIN HFA 108 (90 Base) MCG/ACT inhaler, Inhale 1-2 puffs into the lungs every 4 (four) hours as needed for shortness of breath., Disp: 1 Inhaler, Rfl: 1 No current facility-administered medications for this visit.   Facility-Administered Medications Ordered in Other Visits:  .  heparin lock flush 100 unit/mL, 500 Units, Intravenous, Once, Corcoran, Melissa C, MD .  sodium chloride 0.9 % 1,000 mL with potassium chloride 20 mEq infusion, , Intravenous, Once, Honor Loh E, NP .  sodium chloride flush (NS) 0.9 %  injection 10 mL, 10 mL, Intravenous, Once, Borders, Kirt Boys, NP  Allergies  Allergen Reactions  . Ketamine Anxiety and Other (See Comments)    Syncope episode/confusion     ROS    No other specific complaints in a complete review of systems (except as listed in HPI above).  Objective  Vitals:   05/30/19 1513  BP: 98/60  Pulse: 78  Resp: 16  Temp: (!) 96.9 F (36.1 C)  SpO2: 99%  Weight: 163 lb 4.8 oz (74.1 kg)  Height: 5' 2"  (1.575 m)     Body mass index is 29.87 kg/m.  Nursing Note and Vital Signs reviewed.  Physical Exam Constitutional:      Appearance: Normal appearance. She is well-developed.  HENT:     Head: Normocephalic and atraumatic.     Right Ear: Hearing normal.     Left Ear: Hearing normal.  Eyes:     Conjunctiva/sclera: Conjunctivae normal.  Cardiovascular:     Rate and Rhythm: Normal rate and regular rhythm.     Heart sounds: Normal heart sounds.  Pulmonary:     Effort: Pulmonary effort is normal.     Breath sounds: No stridor. Decreased breath sounds present. No wheezing or rhonchi.  Abdominal:     General: Abdomen is flat. Bowel sounds are normal.     Palpations: Abdomen is soft.     Tenderness: There is no abdominal tenderness.  Musculoskeletal: Normal range of motion.  Skin:    Comments: Bruising to left forearm from IV   Neurological:     General: No focal deficit present.     Mental Status: She is alert and oriented to person, place, and time.  Psychiatric:        Speech: Speech normal.        Behavior: Behavior normal. Behavior is cooperative.        Thought Content: Thought content normal.        Judgment: Judgment normal.       Results for orders placed or performed in visit on 05/29/19 (from the past 48 hour(s))  TSH     Status: None  Collection Time: 05/29/19  8:30 AM  Result Value Ref Range   TSH 3.432 0.350 - 4.500 uIU/mL    Comment: Performed by a 3rd Generation assay with a functional sensitivity of <=0.01  uIU/mL. Performed at [facility], [address] [place], [place] [address]   Magnesium     Status: Abnormal   Collection Time: 05/29/19  8:30 AM  Result Value Ref Range   Magnesium 1.5 (L) 1.7 - 2.4 mg/dL    Comment: Performed at [facility], [address] [place], [place] [address]  Comprehensive metabolic panel     Status: Abnormal   Collection Time: 05/29/19  8:30 AM  Result Value Ref Range   Sodium 136 135 - 145 mmol/L   Potassium 3.9 3.5 - 5.1 mmol/L   Chloride 105 98 - 111 mmol/L   CO2 23 22 - 32 mmol/L   Glucose, Bld 98 70 - 99 mg/dL   BUN 17 6 - 20 mg/dL   Creatinine, Ser 0.68 0.44 - 1.00 mg/dL   Calcium 9.0 8.9 - 10.3 mg/dL   Total Protein 7.1 6.5 - 8.1 g/dL   Albumin 3.4 (L) 3.5 - 5.0 g/dL   AST 36 15 - 41 U/L   ALT 54 (H) 0 - 44 U/L   Alkaline Phosphatase 178 (H) 38 - 126 U/L   Total Bilirubin 0.4 0.3 - 1.2 mg/dL   GFR calc non Af Amer >60 >60 mL/min   GFR calc Af Amer >60 >60 mL/min   Anion gap 8 5 - 15    Comment: Performed at [facility], [address] [place], [place] [address]  CBC with Differential     Status: Abnormal   Collection Time: 05/29/19  8:30 AM  Result Value Ref Range   WBC 4.4 4.0 - 10.5 K/uL   RBC 3.33 (L) 3.87 - 5.11 MIL/uL   Hemoglobin 11.1 (L) 12.0 - 15.0 g/dL   HCT 33.6 (L) 36.0 - 46.0 %   MCV 100.9 (H) 80.0 - 100.0 fL   MCH 33.3 26.0 - 34.0 pg   MCHC 33.0 30.0 - 36.0 g/dL   RDW 13.4 11.5 - 15.5 %   Platelets 205 150 - 400 K/uL   nRBC 0.0 0.0 - 0.2 %   Neutrophils Relative % 72 %   Neutro Abs 3.2 1.7 - 7.7 K/uL   Lymphocytes Relative 19 %   Lymphs Abs 0.8 0.7 - 4.0 K/uL   Monocytes Relative 6 %   Monocytes Absolute 0.3 0.1 - 1.0 K/uL   Eosinophils Relative 1 %   Eosinophils Absolute 0.1 0.0 - 0.5 K/uL   Basophils Relative 1 %   Basophils Absolute 0.0 0.0 - 0.1 K/uL   Immature Granulocytes 1 %   Abs Immature Granulocytes 0.02 0.00 - 0.07 K/uL    Comment: Performed at [facility], [address] [place],  [address]   *Note: Due to a large number of results and/or encounters for the requested time period, some results have not been displayed. A complete set of results can be found in Results Review.    Assessment & Plan  1. Hospital discharge follow-up Stable, mild improvements   2. Atypical chest pain Unchanged, manageable.   3. Malignant neoplasm of overlapping sites of cervix ([place]) Follows up with Dr. Jacinto Reap, palliative therapies  4. Chronic diastolic congestive heart failure (H[facility]) Mild, euvolemic   5. Unspecified adrenocortical insufficiency ([place]) Follow-up with endocrinology   6. Aortic atherosclerosis (H[facility]) LDL and  BP well controlled.

## 2019-06-05 ENCOUNTER — Inpatient Hospital Stay: Payer: Medicaid Other

## 2019-06-05 ENCOUNTER — Other Ambulatory Visit: Payer: Self-pay

## 2019-06-05 DIAGNOSIS — G629 Polyneuropathy, unspecified: Secondary | ICD-10-CM | POA: Diagnosis not present

## 2019-06-05 DIAGNOSIS — Z7952 Long term (current) use of systemic steroids: Secondary | ICD-10-CM | POA: Diagnosis not present

## 2019-06-05 DIAGNOSIS — Z5112 Encounter for antineoplastic immunotherapy: Secondary | ICD-10-CM | POA: Diagnosis present

## 2019-06-05 DIAGNOSIS — C7800 Secondary malignant neoplasm of unspecified lung: Secondary | ICD-10-CM | POA: Diagnosis not present

## 2019-06-05 DIAGNOSIS — Z79899 Other long term (current) drug therapy: Secondary | ICD-10-CM | POA: Diagnosis not present

## 2019-06-05 DIAGNOSIS — C539 Malignant neoplasm of cervix uteri, unspecified: Secondary | ICD-10-CM | POA: Diagnosis not present

## 2019-06-05 DIAGNOSIS — E860 Dehydration: Secondary | ICD-10-CM

## 2019-06-05 DIAGNOSIS — C538 Malignant neoplasm of overlapping sites of cervix uteri: Secondary | ICD-10-CM

## 2019-06-05 DIAGNOSIS — Z86711 Personal history of pulmonary embolism: Secondary | ICD-10-CM | POA: Diagnosis not present

## 2019-06-05 DIAGNOSIS — Z86718 Personal history of other venous thrombosis and embolism: Secondary | ICD-10-CM | POA: Diagnosis not present

## 2019-06-05 DIAGNOSIS — Z7901 Long term (current) use of anticoagulants: Secondary | ICD-10-CM | POA: Diagnosis not present

## 2019-06-05 DIAGNOSIS — Z90722 Acquired absence of ovaries, bilateral: Secondary | ICD-10-CM | POA: Diagnosis not present

## 2019-06-05 DIAGNOSIS — Z923 Personal history of irradiation: Secondary | ICD-10-CM | POA: Diagnosis not present

## 2019-06-05 DIAGNOSIS — Z9221 Personal history of antineoplastic chemotherapy: Secondary | ICD-10-CM | POA: Diagnosis not present

## 2019-06-05 LAB — CBC WITH DIFFERENTIAL/PLATELET
Abs Immature Granulocytes: 0.02 10*3/uL (ref 0.00–0.07)
Basophils Absolute: 0 10*3/uL (ref 0.0–0.1)
Basophils Relative: 0 %
Eosinophils Absolute: 0 10*3/uL (ref 0.0–0.5)
Eosinophils Relative: 1 %
HCT: 38.1 % (ref 36.0–46.0)
Hemoglobin: 12.5 g/dL (ref 12.0–15.0)
Immature Granulocytes: 0 %
Lymphocytes Relative: 29 %
Lymphs Abs: 1.5 10*3/uL (ref 0.7–4.0)
MCH: 33 pg (ref 26.0–34.0)
MCHC: 32.8 g/dL (ref 30.0–36.0)
MCV: 100.5 fL — ABNORMAL HIGH (ref 80.0–100.0)
Monocytes Absolute: 0.2 10*3/uL (ref 0.1–1.0)
Monocytes Relative: 5 %
Neutro Abs: 3.4 10*3/uL (ref 1.7–7.7)
Neutrophils Relative %: 65 %
Platelets: 202 10*3/uL (ref 150–400)
RBC: 3.79 MIL/uL — ABNORMAL LOW (ref 3.87–5.11)
RDW: 13.2 % (ref 11.5–15.5)
WBC: 5.1 10*3/uL (ref 4.0–10.5)
nRBC: 0 % (ref 0.0–0.2)

## 2019-06-05 LAB — COMPREHENSIVE METABOLIC PANEL
ALT: 69 U/L — ABNORMAL HIGH (ref 0–44)
AST: 51 U/L — ABNORMAL HIGH (ref 15–41)
Albumin: 3.7 g/dL (ref 3.5–5.0)
Alkaline Phosphatase: 177 U/L — ABNORMAL HIGH (ref 38–126)
Anion gap: 8 (ref 5–15)
BUN: 15 mg/dL (ref 6–20)
CO2: 25 mmol/L (ref 22–32)
Calcium: 9.3 mg/dL (ref 8.9–10.3)
Chloride: 101 mmol/L (ref 98–111)
Creatinine, Ser: 0.77 mg/dL (ref 0.44–1.00)
GFR calc Af Amer: >60 mL/min (ref >60)
GFR calc non Af Amer: >60 mL/min (ref >60)
Glucose, Bld: 108 mg/dL — ABNORMAL HIGH (ref 70–99)
Potassium: 4.1 mmol/L (ref 3.5–5.1)
Sodium: 134 mmol/L — ABNORMAL LOW (ref 135–145)
Total Bilirubin: 0.5 mg/dL (ref 0.3–1.2)
Total Protein: 8 g/dL (ref 6.5–8.1)

## 2019-06-05 LAB — MAGNESIUM: Magnesium: 1.6 mg/dL — ABNORMAL LOW (ref 1.7–2.4)

## 2019-06-05 MED ORDER — HEPARIN SOD (PORK) LOCK FLUSH 100 UNIT/ML IV SOLN
500.0000 [IU] | Freq: Once | INTRAVENOUS | Status: AC
  Administered 2019-06-05: 11:00:00 500 [IU] via INTRAVENOUS

## 2019-06-05 MED ORDER — SODIUM CHLORIDE 0.9 % IV SOLN
Freq: Once | INTRAVENOUS | Status: AC
  Administered 2019-06-05: 09:00:00 via INTRAVENOUS
  Filled 2019-06-05: qty 250

## 2019-06-05 MED ORDER — MAGNESIUM SULFATE 4 GM/100ML IV SOLN
4.0000 g | Freq: Once | INTRAVENOUS | Status: AC
  Administered 2019-06-05: 09:00:00 4 g via INTRAVENOUS
  Filled 2019-06-05: qty 100

## 2019-06-06 ENCOUNTER — Other Ambulatory Visit: Payer: Self-pay | Admitting: Oncology

## 2019-06-06 ENCOUNTER — Encounter: Payer: Self-pay | Admitting: Internal Medicine

## 2019-06-06 MED ORDER — OXYCODONE HCL 10 MG PO TABS: 10.0000 mg | ORAL_TABLET | Freq: Four times a day (QID) | ORAL | 0 refills | Status: DC | PRN

## 2019-06-06 NOTE — Progress Notes (Signed)
Patient called cancer center requesting refill of Oxycodone 10 mg tab q 6 hours prn for pain.  As mandated by the [place] STOP Act (Strengthen Opioid Misuse Prevention), the [place] Controlled Substance Reporting System ([place]) was reviewed for this patient.  Below is the past 69-month of controlled substance prescriptions as displayed by the registry.  I have personally consulted with my supervising physician, Dr. BRogue Bussing who agrees that continuation of opiate therapy is medically appropriate at this time and agrees to provide continual monitoring, including urine/blood drug screens, as indicated. Refill is appropriate on or after 06/07/19.  NCCSRS reviewed:     JFaythe Casa NP 06/06/2019 11:51 AM [phone]

## 2019-06-06 NOTE — Telephone Encounter (Signed)
I will refill her RX.   Faythe Casa, NP 06/06/2019 11:47 AM

## 2019-06-07 ENCOUNTER — Emergency Department: Payer: Medicaid Other

## 2019-06-07 ENCOUNTER — Inpatient Hospital Stay
Admission: EM | Admit: 2019-06-07 | Discharge: 2019-06-10 | DRG: 394 | Disposition: A | Discharge status: Home / Self Care | Payer: Medicaid Other | Attending: Internal Medicine | Admitting: Internal Medicine

## 2019-06-07 ENCOUNTER — Telehealth: Payer: Self-pay | Admitting: Internal Medicine

## 2019-06-07 ENCOUNTER — Other Ambulatory Visit: Payer: Self-pay

## 2019-06-07 ENCOUNTER — Encounter: Payer: Self-pay | Admitting: Emergency Medicine

## 2019-06-07 DIAGNOSIS — Z79899 Other long term (current) drug therapy: Secondary | ICD-10-CM

## 2019-06-07 DIAGNOSIS — T451X5A Adverse effect of antineoplastic and immunosuppressive drugs, initial encounter: Secondary | ICD-10-CM | POA: Diagnosis present

## 2019-06-07 DIAGNOSIS — Y92009 Unspecified place in unspecified non-institutional (private) residence as the place of occurrence of the external cause: Secondary | ICD-10-CM

## 2019-06-07 DIAGNOSIS — G629 Polyneuropathy, unspecified: Secondary | ICD-10-CM | POA: Diagnosis present

## 2019-06-07 DIAGNOSIS — Z9221 Personal history of antineoplastic chemotherapy: Secondary | ICD-10-CM

## 2019-06-07 DIAGNOSIS — D6851 Activated protein C resistance: Secondary | ICD-10-CM | POA: Diagnosis present

## 2019-06-07 DIAGNOSIS — C538 Malignant neoplasm of overlapping sites of cervix uteri: Secondary | ICD-10-CM | POA: Diagnosis present

## 2019-06-07 DIAGNOSIS — B182 Chronic viral hepatitis C: Secondary | ICD-10-CM | POA: Diagnosis present

## 2019-06-07 DIAGNOSIS — Z87891 Personal history of nicotine dependence: Secondary | ICD-10-CM | POA: Diagnosis not present

## 2019-06-07 DIAGNOSIS — Z96652 Presence of left artificial knee joint: Secondary | ICD-10-CM | POA: Diagnosis present

## 2019-06-07 DIAGNOSIS — F1290 Cannabis use, unspecified, uncomplicated: Secondary | ICD-10-CM | POA: Diagnosis present

## 2019-06-07 DIAGNOSIS — K56609 Unspecified intestinal obstruction, unspecified as to partial versus complete obstruction: Secondary | ICD-10-CM | POA: Diagnosis not present

## 2019-06-07 DIAGNOSIS — K529 Noninfective gastroenteritis and colitis, unspecified: Secondary | ICD-10-CM

## 2019-06-07 DIAGNOSIS — Y842 Radiological procedure and radiotherapy as the cause of abnormal reaction of the patient, or of later complication, without mention of misadventure at the time of the procedure: Secondary | ICD-10-CM | POA: Diagnosis present

## 2019-06-07 DIAGNOSIS — E860 Dehydration: Secondary | ICD-10-CM | POA: Diagnosis present

## 2019-06-07 DIAGNOSIS — C7800 Secondary malignant neoplasm of unspecified lung: Secondary | ICD-10-CM | POA: Diagnosis present

## 2019-06-07 DIAGNOSIS — Z923 Personal history of irradiation: Secondary | ICD-10-CM | POA: Diagnosis not present

## 2019-06-07 DIAGNOSIS — Z86711 Personal history of pulmonary embolism: Secondary | ICD-10-CM | POA: Diagnosis not present

## 2019-06-07 DIAGNOSIS — Z20828 Contact with and (suspected) exposure to other viral communicable diseases: Secondary | ICD-10-CM | POA: Diagnosis present

## 2019-06-07 DIAGNOSIS — E559 Vitamin D deficiency, unspecified: Secondary | ICD-10-CM | POA: Diagnosis present

## 2019-06-07 DIAGNOSIS — Z8711 Personal history of peptic ulcer disease: Secondary | ICD-10-CM

## 2019-06-07 DIAGNOSIS — M170 Bilateral primary osteoarthritis of knee: Secondary | ICD-10-CM | POA: Diagnosis present

## 2019-06-07 DIAGNOSIS — Z9114 Patient's other noncompliance with medication regimen: Secondary | ICD-10-CM | POA: Diagnosis not present

## 2019-06-07 DIAGNOSIS — I110 Hypertensive heart disease with heart failure: Secondary | ICD-10-CM | POA: Diagnosis present

## 2019-06-07 DIAGNOSIS — Z9049 Acquired absence of other specified parts of digestive tract: Secondary | ICD-10-CM

## 2019-06-07 DIAGNOSIS — R197 Diarrhea, unspecified: Secondary | ICD-10-CM | POA: Diagnosis not present

## 2019-06-07 DIAGNOSIS — C7801 Secondary malignant neoplasm of right lung: Secondary | ICD-10-CM | POA: Diagnosis not present

## 2019-06-07 DIAGNOSIS — E2740 Unspecified adrenocortical insufficiency: Secondary | ICD-10-CM | POA: Diagnosis present

## 2019-06-07 DIAGNOSIS — Z8249 Family history of ischemic heart disease and other diseases of the circulatory system: Secondary | ICD-10-CM

## 2019-06-07 DIAGNOSIS — Z8701 Personal history of pneumonia (recurrent): Secondary | ICD-10-CM

## 2019-06-07 DIAGNOSIS — C7802 Secondary malignant neoplasm of left lung: Secondary | ICD-10-CM | POA: Diagnosis not present

## 2019-06-07 DIAGNOSIS — Z86718 Personal history of other venous thrombosis and embolism: Secondary | ICD-10-CM | POA: Diagnosis not present

## 2019-06-07 DIAGNOSIS — C539 Malignant neoplasm of cervix uteri, unspecified: Secondary | ICD-10-CM | POA: Diagnosis not present

## 2019-06-07 DIAGNOSIS — I5032 Chronic diastolic (congestive) heart failure: Secondary | ICD-10-CM | POA: Diagnosis present

## 2019-06-07 DIAGNOSIS — K219 Gastro-esophageal reflux disease without esophagitis: Secondary | ICD-10-CM | POA: Diagnosis present

## 2019-06-07 DIAGNOSIS — M479 Spondylosis, unspecified: Secondary | ICD-10-CM | POA: Diagnosis present

## 2019-06-07 DIAGNOSIS — Z888 Allergy status to other drugs, medicaments and biological substances status: Secondary | ICD-10-CM

## 2019-06-07 DIAGNOSIS — R112 Nausea with vomiting, unspecified: Secondary | ICD-10-CM

## 2019-06-07 DIAGNOSIS — Z90722 Acquired absence of ovaries, bilateral: Secondary | ICD-10-CM | POA: Diagnosis not present

## 2019-06-07 DIAGNOSIS — Z90721 Acquired absence of ovaries, unilateral: Secondary | ICD-10-CM

## 2019-06-07 DIAGNOSIS — K520 Gastroenteritis and colitis due to radiation: Principal | ICD-10-CM | POA: Diagnosis present

## 2019-06-07 LAB — CBC WITH DIFFERENTIAL/PLATELET
Abs Immature Granulocytes: 0.01 10*3/uL (ref 0.00–0.07)
Basophils Absolute: 0 10*3/uL (ref 0.0–0.1)
Basophils Relative: 0 %
Eosinophils Absolute: 0 10*3/uL (ref 0.0–0.5)
Eosinophils Relative: 1 %
HCT: 41.8 % (ref 36.0–46.0)
Hemoglobin: 13.7 g/dL (ref 12.0–15.0)
Immature Granulocytes: 0 %
Lymphocytes Relative: 21 %
Lymphs Abs: 1 10*3/uL (ref 0.7–4.0)
MCH: 33.1 pg (ref 26.0–34.0)
MCHC: 32.8 g/dL (ref 30.0–36.0)
MCV: 101 fL — ABNORMAL HIGH (ref 80.0–100.0)
Monocytes Absolute: 0.3 10*3/uL (ref 0.1–1.0)
Monocytes Relative: 7 %
Neutro Abs: 3.4 10*3/uL (ref 1.7–7.7)
Neutrophils Relative %: 71 %
Platelets: 205 10*3/uL (ref 150–400)
RBC: 4.14 MIL/uL (ref 3.87–5.11)
RDW: 13.1 % (ref 11.5–15.5)
WBC: 4.8 10*3/uL (ref 4.0–10.5)
nRBC: 0 % (ref 0.0–0.2)

## 2019-06-07 LAB — COMPREHENSIVE METABOLIC PANEL
ALT: 67 U/L — ABNORMAL HIGH (ref 0–44)
AST: 48 U/L — ABNORMAL HIGH (ref 15–41)
Albumin: 3.9 g/dL (ref 3.5–5.0)
Alkaline Phosphatase: 170 U/L — ABNORMAL HIGH (ref 38–126)
Anion gap: 13 (ref 5–15)
BUN: 14 mg/dL (ref 6–20)
CO2: 22 mmol/L (ref 22–32)
Calcium: 9.8 mg/dL (ref 8.9–10.3)
Chloride: 100 mmol/L (ref 98–111)
Creatinine, Ser: 0.78 mg/dL (ref 0.44–1.00)
GFR calc Af Amer: >60 mL/min (ref >60)
GFR calc non Af Amer: >60 mL/min (ref >60)
Glucose, Bld: 85 mg/dL (ref 70–99)
Potassium: 3.6 mmol/L (ref 3.5–5.1)
Sodium: 135 mmol/L (ref 135–145)
Total Bilirubin: 0.8 mg/dL (ref 0.3–1.2)
Total Protein: 8.1 g/dL (ref 6.5–8.1)

## 2019-06-07 LAB — LIPASE, BLOOD: Lipase: 19 U/L (ref 11–51)

## 2019-06-07 LAB — MAGNESIUM: Magnesium: 1.4 mg/dL — ABNORMAL LOW (ref 1.7–2.4)

## 2019-06-07 MED ORDER — METRONIDAZOLE IN NACL 5-0.79 MG/ML-% IV SOLN
500.0000 mg | Freq: Three times a day (TID) | INTRAVENOUS | Status: DC
  Administered 2019-06-07 – 2019-06-10 (×8): 500 mg via INTRAVENOUS
  Filled 2019-06-07 (×11): qty 100

## 2019-06-07 MED ORDER — SODIUM CHLORIDE 0.9 % IV BOLUS
1000.0000 mL | Freq: Once | INTRAVENOUS | Status: AC
  Administered 2019-06-07: 1000 mL via INTRAVENOUS

## 2019-06-07 MED ORDER — OXYCODONE HCL 5 MG PO TABS
10.0000 mg | ORAL_TABLET | Freq: Four times a day (QID) | ORAL | Status: DC | PRN
  Administered 2019-06-07 – 2019-06-09 (×5): 10 mg via ORAL
  Filled 2019-06-07 (×5): qty 2

## 2019-06-07 MED ORDER — CIPROFLOXACIN IN D5W 400 MG/200ML IV SOLN
400.0000 mg | Freq: Once | INTRAVENOUS | Status: AC
  Administered 2019-06-07: 400 mg via INTRAVENOUS
  Filled 2019-06-07: qty 200

## 2019-06-07 MED ORDER — ENSURE ENLIVE PO LIQD: 237.0000 mL | Freq: Three times a day (TID) | ORAL | Status: DC

## 2019-06-07 MED ORDER — HYDROXYZINE HCL 10 MG PO TABS
10.0000 mg | ORAL_TABLET | Freq: Three times a day (TID) | ORAL | Status: DC | PRN
  Filled 2019-06-07: qty 1

## 2019-06-07 MED ORDER — ONDANSETRON HCL 4 MG/2ML IJ SOLN
4.0000 mg | Freq: Once | INTRAMUSCULAR | Status: AC
  Administered 2019-06-07: 4 mg via INTRAVENOUS
  Filled 2019-06-07: qty 2

## 2019-06-07 MED ORDER — ADULT MULTIVITAMIN W/MINERALS CH
1.0000 | ORAL_TABLET | Freq: Every day | ORAL | Status: DC
  Administered 2019-06-07 – 2019-06-10 (×4): 1 via ORAL
  Filled 2019-06-07 (×4): qty 1

## 2019-06-07 MED ORDER — ONDANSETRON HCL 4 MG PO TABS: 4.0000 mg | ORAL_TABLET | Freq: Four times a day (QID) | ORAL | Status: DC | PRN

## 2019-06-07 MED ORDER — SODIUM CHLORIDE 0.9 % IV SOLN
INTRAVENOUS | Status: DC
  Administered 2019-06-07 – 2019-06-10 (×5): via INTRAVENOUS

## 2019-06-07 MED ORDER — DIPHENOXYLATE-ATROPINE 2.5-0.025 MG PO TABS: 1.0000 | ORAL_TABLET | Freq: Four times a day (QID) | ORAL | Status: DC

## 2019-06-07 MED ORDER — HYDROMORPHONE HCL 1 MG/ML IJ SOLN
1.0000 mg | Freq: Once | INTRAMUSCULAR | Status: AC
  Administered 2019-06-07: 1 mg via INTRAVENOUS
  Filled 2019-06-07: qty 1

## 2019-06-07 MED ORDER — MAGNESIUM SULFATE 2 GM/50ML IV SOLN
2.0000 g | Freq: Once | INTRAVENOUS | Status: AC
  Administered 2019-06-07: 2 g via INTRAVENOUS
  Filled 2019-06-07: qty 50

## 2019-06-07 MED ORDER — FAMOTIDINE 20 MG PO TABS
20.0000 mg | ORAL_TABLET | Freq: Two times a day (BID) | ORAL | Status: DC
  Administered 2019-06-07 – 2019-06-10 (×7): 20 mg via ORAL
  Filled 2019-06-07 (×7): qty 1

## 2019-06-07 MED ORDER — CIPROFLOXACIN IN D5W 400 MG/200ML IV SOLN
400.0000 mg | Freq: Two times a day (BID) | INTRAVENOUS | Status: DC
  Administered 2019-06-08 – 2019-06-10 (×5): 400 mg via INTRAVENOUS
  Filled 2019-06-07 (×7): qty 200

## 2019-06-07 MED ORDER — PANTOPRAZOLE SODIUM 40 MG PO TBEC
40.0000 mg | DELAYED_RELEASE_TABLET | Freq: Every day | ORAL | Status: DC
  Administered 2019-06-07 – 2019-06-10 (×4): 40 mg via ORAL
  Filled 2019-06-07 (×4): qty 1

## 2019-06-07 MED ORDER — CALCIUM CARBONATE-VITAMIN D 500-200 MG-UNIT PO TABS
2.0000 | ORAL_TABLET | Freq: Every day | ORAL | Status: DC
  Administered 2019-06-07 – 2019-06-10 (×4): 2 via ORAL
  Filled 2019-06-07 (×4): qty 2

## 2019-06-07 MED ORDER — OXYCODONE HCL ER 15 MG PO T12A
15.0000 mg | EXTENDED_RELEASE_TABLET | Freq: Two times a day (BID) | ORAL | Status: DC
  Administered 2019-06-07 – 2019-06-10 (×6): 15 mg via ORAL
  Filled 2019-06-07 (×6): qty 1

## 2019-06-07 MED ORDER — IOHEXOL 300 MG/ML  SOLN
100.0000 mL | Freq: Once | INTRAMUSCULAR | Status: AC | PRN
  Administered 2019-06-07: 100 mL via INTRAVENOUS

## 2019-06-07 MED ORDER — ACETAMINOPHEN 325 MG PO TABS: 650.0000 mg | ORAL_TABLET | Freq: Four times a day (QID) | ORAL | Status: DC | PRN

## 2019-06-07 MED ORDER — ACETAMINOPHEN 650 MG RE SUPP: 650.0000 mg | Freq: Four times a day (QID) | RECTAL | Status: DC | PRN

## 2019-06-07 MED ORDER — ONDANSETRON HCL 4 MG/2ML IJ SOLN
4.0000 mg | Freq: Four times a day (QID) | INTRAMUSCULAR | Status: DC | PRN
  Administered 2019-06-07 – 2019-06-09 (×5): 4 mg via INTRAVENOUS
  Filled 2019-06-07 (×5): qty 2

## 2019-06-07 MED ORDER — SUCRALFATE 1 G PO TABS
1.0000 g | ORAL_TABLET | Freq: Four times a day (QID) | ORAL | Status: DC
  Administered 2019-06-07 – 2019-06-10 (×12): 1 g via ORAL
  Filled 2019-06-07 (×12): qty 1

## 2019-06-07 MED ORDER — RIVAROXABAN 20 MG PO TABS
20.0000 mg | ORAL_TABLET | Freq: Every day | ORAL | Status: DC
  Administered 2019-06-08 – 2019-06-09 (×2): 20 mg via ORAL
  Filled 2019-06-07 (×4): qty 1

## 2019-06-07 MED ORDER — AMITRIPTYLINE HCL 25 MG PO TABS
75.0000 mg | ORAL_TABLET | Freq: Every day | ORAL | Status: DC
  Administered 2019-06-07 – 2019-06-09 (×3): 75 mg via ORAL
  Filled 2019-06-07 (×3): qty 3

## 2019-06-07 MED ORDER — SODIUM CHLORIDE 0.9 % IV SOLN
Freq: Once | INTRAVENOUS | Status: AC
  Administered 2019-06-07: 12:00:00 via INTRAVENOUS

## 2019-06-07 MED ORDER — ALBUTEROL SULFATE (2.5 MG/3ML) 0.083% IN NEBU: 2.5000 mg | INHALATION_SOLUTION | RESPIRATORY_TRACT | Status: DC | PRN

## 2019-06-07 MED ORDER — PROMETHAZINE HCL 25 MG/ML IJ SOLN
25.0000 mg | Freq: Four times a day (QID) | INTRAMUSCULAR | Status: DC | PRN
  Administered 2019-06-09 – 2019-06-10 (×4): 25 mg via INTRAVENOUS
  Filled 2019-06-07 (×4): qty 1

## 2019-06-07 MED ORDER — HYOSCYAMINE SULFATE ER 0.375 MG PO TB12
0.3750 mg | ORAL_TABLET | Freq: Two times a day (BID) | ORAL | Status: DC
  Administered 2019-06-07 – 2019-06-09 (×4): 0.375 mg via ORAL
  Filled 2019-06-07 (×6): qty 1

## 2019-06-07 MED ORDER — METRONIDAZOLE IN NACL 5-0.79 MG/ML-% IV SOLN
500.0000 mg | Freq: Once | INTRAVENOUS | Status: AC
  Administered 2019-06-07: 17:00:00 500 mg via INTRAVENOUS

## 2019-06-07 NOTE — ED Notes (Signed)
Pt given ginger ale.

## 2019-06-07 NOTE — ED Notes (Signed)
ED TO INPATIENT HANDOFF REPORT  ED Nurse Name and Phone #: Lylie Blacklock [phone]  S Name/Age/Gender Joanna Hall 53 y.o. female Room/Bed: ED02A/ED02A  Code Status   Code Status: Prior  Home/SNF/Other Home Patient oriented to: self, place, time and situation Is this baseline? Yes   Triage Complete: Triage complete  Chief Complaint Dr Rosendo Gros vomiting  Triage Note PT to ER with c/o vomiting and diarrhea since yesterday.  PT called cancer center on-call and was advised to come to ER.   Allergies Allergies  Allergen Reactions  . Ketamine Anxiety and Other (See Comments)    Syncope episode/confusion     Level of Care/Admitting Diagnosis ED Disposition    ED Disposition [facility] [place] [[id number]]  Level of Care: Med-Surg [16]  Covid Evaluation: Asymptomatic Screening Protocol (No Symptoms)  Diagnosis: Intractable nausea and vomiting [675916]  Admitting Physician: Hyman Bible DODD [[id number]]  Attending Physician: Hyman Bible DODD [[id number]]  Estimated length of stay: past midnight tomorrow  Certification:: I certify this patient will need inpatient services for at least 2 midnights  PT Class (Do Not Modify): Inpatient [101]  PT Acc Code (Do Not Modify): Private [1]       B Medical/Surgery History Past Medical History:  Diagnosis Date  . Abdominal pain 06/10/2018  . Abnormal cervical Papanicolaou smear 09/18/2017  . Anxiety   . Aortic atherosclerosis ([place])   . Arthritis    neck and knees  . 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 Blood transfusion without reported diagnosis   . Cervical cancer ([place]) 09/2016   mets lung  . Chronic anal fissure   . Chronic diarrhea   . Dyspnea   . Erosive gastropathy 09/18/2017  . Factor V Leiden mutation ([place])   . Fecal incontinence   . Genital warts   . GERD (gastroesophageal reflux disease)   . GI bleed 10/08/2018  . Heart murmur   . Hematochezia   .  Hemorrhoids   . 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 Hepatitis, chronic ([place]) 05/05/2017  . History of cancer chemotherapy    completed 06/2017  . History of Clostridium difficile infection    while undergoing chemo.  Negative test 10/2017  . Ileocolic anastomotic leak   . 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 Intestinal infection due to Clostridium difficile 09/18/2017  . Macrocytic anemia with vitamin B12 deficiency   . Multiple gastric ulcers   . Nausea vomiting and diarrhea   . Pancolitis ([place]) 07/27/2018  . Perianal condylomata   . Pneumonia    History of  . Pulmonary nodules   . Rectal bleeding   . Small bowel obstruction ([place]) 08/2017  . Stiff neck    limited right turn  . Vitamin D deficiency    Past Surgical History:  Procedure Laterality Date  . CHOLECYSTECTOMY    . COLON SURGERY  08/2017   resection  . COLONOSCOPY WITH PROPOFOL N/A 12/20/2017   Procedure: COLONOSCOPY WITH PROPOFOL;  Surgeon: [name], MD;  Location: [facility] ENDOSCOPY;  Service: Gastroenterology;  Laterality: N/A;  . COLONOSCOPY WITH PROPOFOL N/A 07/30/2018   Procedure: COLONOSCOPY WITH PROPOFOL;  Surgeon: [name], MD;  Location: [facility] ENDOSCOPY;  Service: Gastroenterology;  Laterality: N/A;  . COLONOSCOPY WITH PROPOFOL N/A 10/10/2018   Procedure: COLONOSCOPY WITH PROPOFOL;  Surgeon: Lucilla Lame, MD;  Location: [facility] ENDOSCOPY;  Service: Endoscopy;  Laterality: N/A;  . DIAGNOSTIC LAPAROSCOPY    . ESOPHAGOGASTRODUODENOSCOPY (EGD) WITH PROPOFOL N/A 12/20/2017   Procedure: ESOPHAGOGASTRODUODENOSCOPY (EGD) WITH PROPOFOL;  Surgeon: [name], MD;  Location: [facility];  Service: Gastroenterology;  Laterality: N/A;  . ESOPHAGOGASTRODUODENOSCOPY (EGD) WITH PROPOFOL  07/30/2018   Procedure: ESOPHAGOGASTRODUODENOSCOPY (EGD) WITH PROPOFOL;  Surgeon: [name], MD;  Location: [facility] ENDOSCOPY;  Service: Gastroenterology;;  .  Otho Darner SIGMOIDOSCOPY N/A 11/21/2018   Procedure: FLEXIBLE SIGMOIDOSCOPY;  Surgeon: [name], MD;  Location: [facility] ENDOSCOPY;  Service: Gastroenterology;  Laterality: N/A;  . LAPAROTOMY N/A 08/31/2017   Procedure: EXPLORATORY LAPAROTOMY for SBO, ileocolectomy, removal of piece of uterine wall;  Surgeon: [name], MD;  Location: [facility] ORS;  Service: General;  Laterality: N/A;  . LASER ABLATION CONDOLAMATA N/A 02/22/2018   Procedure: LASER ABLATION/REMOVAL OF WCHENIDPOEU AROUND ANUS AND VAGINA;  Surgeon: Michael Boston, MD;  Location: [place];  Service: General;  Laterality: N/A;  . OOPHORECTOMY    . PORTA CATH INSERTION N/A 05/13/2018   Procedure: PORTA CATH INSERTION;  Surgeon: Katha Cabal, MD;  Location: [place] CV [facility];  Service: Cardiovascular;  Laterality: N/A;  . SMALL INTESTINE SURGERY    . TANDEM RING INSERTION     x3  . THORACOTOMY    . TOTAL KNEE ARTHROPLASTY Left 04/24/2018   Procedure: TOTAL KNEE ARTHROPLASTY;  Surgeon: Lovell Sheehan, MD;  Location: [facility] ORS;  Service: Orthopedics;  Laterality: Left;     A IV Location/Drains/Wounds Patient Lines/Drains/Airways Status   Active Line/Drains/Airways    Name:   Placement date:   Placement time:   Site:   Days:   Implanted Port 05/29/19   05/29/19    0846    -   9          Intake/Output Last 24 hours  Intake/Output Summary (Last 24 hours) at 06/07/2019 1431 Last data filed at 06/07/2019 1205 Gross per 24 hour  Intake 975.81 ml  Output -  Net 975.81 ml    Labs/Imaging Results for orders placed or performed during the hospital encounter of 06/07/19 (from the past 48 hour(s))  CBC with Differential     Status: Abnormal   Collection Time: 06/07/19 10:00 AM  Result Value Ref Range   WBC 4.8 4.0 - 10.5 K/uL   RBC 4.14 3.87 - 5.11 MIL/uL   Hemoglobin 13.7 12.0 - 15.0 g/dL   HCT 41.8 36.0 - 46.0 %   MCV 101.0 (H) 80.0 - 100.0 fL   MCH 33.1 26.0 - 34.0 pg   MCHC 32.8 30.0 - 36.0  g/dL   RDW 13.1 11.5 - 15.5 %   Platelets 205 150 - 400 K/uL   nRBC 0.0 0.0 - 0.2 %   Neutrophils Relative % 71 %   Neutro Abs 3.4 1.7 - 7.7 K/uL   Lymphocytes Relative 21 %   Lymphs Abs 1.0 0.7 - 4.0 K/uL   Monocytes Relative 7 %   Monocytes Absolute 0.3 0.1 - 1.0 K/uL   Eosinophils Relative 1 %   Eosinophils Absolute 0.0 0.0 - 0.5 K/uL   Basophils Relative 0 %   Basophils Absolute 0.0 0.0 - 0.1 K/uL   Immature Granulocytes 0 %   Abs Immature Granulocytes 0.01 0.00 - 0.07 K/uL    Comment: Performed at [facility], [address], [place], [place] [address]  Comprehensive metabolic panel     Status: Abnormal   Collection Time: 06/07/19  10:00 AM  Result Value Ref Range   Sodium 135 135 - 145 mmol/L   Potassium 3.6 3.5 - 5.1 mmol/L   Chloride 100 98 - 111 mmol/L   CO2 22 22 - 32 mmol/L   Glucose, Bld 85 70 - 99 mg/dL   BUN 14 6 - 20 mg/dL   Creatinine, Ser 0.78 0.44 - 1.00 mg/dL   Calcium 9.8 8.9 - 10.3 mg/dL   Total Protein 8.1 6.5 - 8.1 g/dL   Albumin 3.9 3.5 - 5.0 g/dL   AST 48 (H) 15 - 41 U/L   ALT 67 (H) 0 - 44 U/L   Alkaline Phosphatase 170 (H) 38 - 126 U/L   Total Bilirubin 0.8 0.3 - 1.2 mg/dL   GFR calc non Af Amer >60 >60 mL/min   GFR calc Af Amer >60 >60 mL/min   Anion gap 13 5 - 15    Comment: Performed at [facility], [address] [place], [place] [address]  Magnesium     Status: Abnormal   Collection Time: 06/07/19 10:00 AM  Result Value Ref Range   Magnesium 1.4 (L) 1.7 - 2.4 mg/dL    Comment: Performed at [facility], [address] [place], [place] [address]  Lipase, blood     Status: None   Collection Time: 06/07/19 10:00 AM  Result Value Ref Range   Lipase 19 11 - 51 U/L    Comment: Performed at [facility], [address] [place], [place] [address]   *Note: Due to a large number of results and/or encounters for the requested time period, some results have not been displayed. A complete set of results  can be found in Results Review.   Ct Head Wo Contrast  Result Date: 06/07/2019 CLINICAL DATA:  Headache, history of metastatic cervical cancer EXAM: CT HEAD WITHOUT CONTRAST TECHNIQUE: Contiguous axial images were obtained from the base of the skull through the vertex without intravenous contrast. COMPARISON:  05/18/2019 FINDINGS: Brain: No evidence of acute infarction, hemorrhage, hydrocephalus, extra-axial collection or mass lesion/mass effect. Vascular: No hyperdense vessel or unexpected calcification. Skull: Normal. Negative for fracture or focal lesion. Sinuses/Orbits: The visualized paranasal sinuses are essentially clear. The mastoid air cells are unopacified. Other: None. IMPRESSION: Normal head CT. Electronically Signed   By: Julian Hy M.D.   On: 06/07/2019 11:04   Dg Hip Unilat W Or Wo Pelvis 2-3 Views Right  Result Date: 06/07/2019 CLINICAL DATA:  Right hip pain for 1 week. No reported injury. History of pseudogout. EXAM: DG HIP (WITH OR WITHOUT PELVIS) 2-3V RIGHT COMPARISON:  04/21/2027 CT abdomen/pelvis. FINDINGS: There is no evidence of hip fracture or dislocation. There is no evidence of significant arthropathy or other focal bone abnormality. Degenerative changes in the visualized lower lumbar spine. IMPRESSION: No acute osseous abnormality. Electronically Signed   By: Ilona Sorrel M.D.   On: 06/07/2019 11:58    Pending Labs Unresulted Labs (From admission, onward)    Start     Ordered   06/07/19 1326  SARS CORONAVIRUS 2 Nasal Swab Aptima Multi Swab  (Asymptomatic/Tier 2 Patients Labs)  Once,   STAT    Question Answer Comment  Is this test for diagnosis or screening Screening   Symptomatic for COVID-19 as defined by CDC No   Hospitalized for COVID-19 No   Admitted to ICU for COVID-19 No   Previously tested for COVID-19 Yes   Resident in a congregate (group) care setting No   Employed in  healthcare setting No   Pregnant No      06/07/19 1326   06/07/19 1323  Blood  culture (routine x 2)  BLOOD CULTURE X 2,   STAT     06/07/19 1322   Signed and Held  Basic metabolic panel  Tomorrow morning,   R     Signed and Held   Signed and Held  CBC  Tomorrow morning,   R     Signed and Held   Signed and Held  Magnesium  Tomorrow morning,   R     Signed and Held   Signed and Held  Gastrointestinal Panel by PCR , Stool  (Gastrointestinal Panel by PCR, Stool)  Once,   R     Signed and Held   Signed and Held  C difficile quick scan w PCR reflex  (C Difficile quick screen w PCR reflex panel)  Once, for 24 hours,   R     Signed and Held          Vitals/Pain Today's Vitals   06/07/19 1205 06/07/19 1205 06/07/19 1226 06/07/19 1230  BP: 123/85   119/79  Pulse: 84   88  Resp: 18   11  Temp:      TempSrc:      SpO2: 100%   99%  Weight:      Height:      PainSc:  7  7      Isolation Precautions No active isolations  Medications Medications  ciprofloxacin (CIPRO) IVPB 400 mg (has no administration in time range)  metroNIDAZOLE (FLAGYL) IVPB 500 mg (has no administration in time range)  sodium chloride 0.9 % bolus 1,000 mL ( Intravenous Stopped 06/07/19 1118)  ondansetron (ZOFRAN) injection 4 mg (4 mg Intravenous Given 06/07/19 1023)  HYDROmorphone (DILAUDID) injection 1 mg (1 mg Intravenous Given 06/07/19 1023)  sodium chloride 0.9 % bolus 1,000 mL (0 mLs Intravenous Stopped 06/07/19 1121)  0.9 %  sodium chloride infusion ( Intravenous New Bag/Given 06/07/19 1201)  magnesium sulfate IVPB 2 g 50 mL ( Intravenous Stopped 06/07/19 1204)  iohexol (OMNIPAQUE) 300 MG/ML solution 100 mL (100 mLs Intravenous Contrast Given 06/07/19 1136)  HYDROmorphone (DILAUDID) injection 1 mg (1 mg Intravenous Given 06/07/19 1204)    Mobility walks Low fall risk   Focused Assessments RLQ pain. CA pt. left shoulder pain/headaches.    R Recommendations: See Admitting Provider Note  Report given to:   Additional Notes:

## 2019-06-07 NOTE — ED Provider Notes (Signed)
[facility] Emergency Department Provider Note  ____________________________________________   First MD Initiated Contact with Patient 06/07/19 [phone]     (approximate)  I have reviewed the triage vital signs and the nursing notes.   HISTORY  Chief Complaint Emesis and Diarrhea    HPI Joanna Hall is a 53 y.o. female  Here with abdominal pain. Pt has a long h/o metastatic colon CA on Truvada. She received a treatment recently and since then, has had significant, diffuse abd pain with n/v/d. Her vomiting and diarrhea is non bloody. Pain is diffuse, crampy, and worse w/ eating and palpation. No alleviating factors. No dysuria. No fevers or chills. No cough. No other sx.        Past Medical History:  Diagnosis Date   Abdominal pain 06/10/2018   Abnormal cervical Papanicolaou smear 09/18/2017   Anxiety    Aortic atherosclerosis (HCC)    Arthritis    neck and knees   Blood clots in brain    both lungs and right kidney   Blood transfusion without reported diagnosis    Cervical cancer ([place]) 09/2016   mets lung   Chronic anal fissure    Chronic diarrhea    Dyspnea    Erosive gastropathy 09/18/2017   Factor V Leiden mutation ([place])    Fecal incontinence    Genital warts    GERD (gastroesophageal reflux disease)    GI bleed 10/08/2018   Heart murmur    Hematochezia    Hemorrhoids    Hepatitis C    Chronic, 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  Negative test 10/3084   Ileocolic anastomotic leak    Infarction of kidney (HCC) left kidney   and uterus   Intestinal infection due to Clostridium difficile 09/18/2017   Macrocytic anemia with vitamin B12 deficiency    Multiple gastric ulcers    Nausea vomiting and diarrhea    Pancolitis ([place]) 07/27/2018    Perianal condylomata    Pneumonia    History of   Pulmonary nodules    Rectal bleeding    Small bowel obstruction ([place]) 08/2017   Stiff neck    limited right turn   Vitamin D deficiency     Patient Active Problem List   Diagnosis Date Noted   Intractable nausea and vomiting 06/07/2019   Chronic diastolic congestive heart failure ([place]) 05/30/2019   Chest pain in adult 05/19/2019   Intractable pain 05/19/2019   Cancer associated pain    Proctitis, radiation    Palliative care encounter    Encounter for monitoring opioid maintenance therapy 11/19/2018   Adrenal insufficiency ([place]) 09/03/2018   Chronic diarrhea 06/25/2018   Chronic anticoagulation 06/25/2018   Vomiting    Encounter for antineoplastic chemotherapy 06/04/2018   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   Lung metastasis ([place]) 05/15/2018   Closed fracture of distal end of radius 05/04/2018   History of total knee arthroplasty 04/24/2018   Osteoarthritis of left knee 02/28/2018   Condyloma acuminatum of anus s/p ablation 02/22/2018 02/22/2018   Condyloma acuminatum of vagina s/p ablation 02/22/2018 02/22/2018   Positive ANA (antinuclear antibody) 02/04/2018   Aortic atherosclerosis ([place]) 01/15/2018   Elevated MCV 01/15/2018   Anemia 01/15/2018   Genital warts 01/15/2018   Vitamin D deficiency 01/15/2018   Pernicious anemia  Impingement syndrome of shoulder region 12/19/2017   Multiple joint pain 12/19/2017   Osteoarthritis of right knee 12/19/2017   B12 deficiency 12/11/2017   Lung nodules 12/11/2017   History of Clostridium difficile infection 10/23/2017   Factor V Leiden ([place]) 10/19/2017   Goals of care, counseling/discussion 10/19/2017   Cervical cancer, FIGO stage IB1 ([place]) 09/18/2017   Chronic hepatitis C without hepatic coma ([place]) 09/18/2017   Cytopenia 09/18/2017   Diarrhea 09/18/2017   Lower abdominal pain 09/18/2017   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    Malignant neoplasm of overlapping sites of cervix ([place]) 09/18/2017   Renal insufficiency 09/18/2017   Wound infection after surgery 09/12/2017   Hypokalemia    Hypomagnesemia    Cervical arthritis 07/18/2017   Dysuria 06/20/2017   Metastatic cancer ([place]) 05/12/2017   Essential hypertension 03/15/2017   Anemia in other chronic diseases classified elsewhere 03/01/2017   Chemotherapy-induced neutropenia ([place]) 01/29/2017   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    Past Surgical History:  Procedure Laterality Date   CHOLECYSTECTOMY     COLON SURGERY  08/2017   resection   COLONOSCOPY WITH PROPOFOL N/A 12/20/2017   Procedure: COLONOSCOPY WITH PROPOFOL;  Surgeon: [name], MD;  Location: [facility] ENDOSCOPY;  Service: Gastroenterology;  Laterality: N/A;   COLONOSCOPY WITH PROPOFOL N/A 07/30/2018   Procedure: COLONOSCOPY WITH PROPOFOL;  Surgeon: [name], MD;  Location: [facility] ENDOSCOPY;  Service: Gastroenterology;  Laterality: N/A;   COLONOSCOPY WITH PROPOFOL N/A 10/10/2018   Procedure: COLONOSCOPY WITH PROPOFOL;  Surgeon: Lucilla Lame, MD;  Location: [facility] ENDOSCOPY;  Service: Endoscopy;  Laterality: N/A;   DIAGNOSTIC LAPAROSCOPY     ESOPHAGOGASTRODUODENOSCOPY (EGD) WITH PROPOFOL N/A 12/20/2017   Procedure: ESOPHAGOGASTRODUODENOSCOPY (EGD) WITH PROPOFOL;  Surgeon: [name], MD;  Location: [facility] ENDOSCOPY;  Service: Gastroenterology;  Laterality: N/A;   ESOPHAGOGASTRODUODENOSCOPY (EGD) WITH PROPOFOL  07/30/2018   Procedure: ESOPHAGOGASTRODUODENOSCOPY (EGD) WITH PROPOFOL;  Surgeon: [name], MD;  Location: [facility] ENDOSCOPY;  Service: Gastroenterology;;   [place] N/A 11/21/2018   Procedure: FLEXIBLE SIGMOIDOSCOPY;  Surgeon: [name], MD;  Location: [facility] ENDOSCOPY;  Service: Gastroenterology;  Laterality: N/A;   LAPAROTOMY N/A 08/31/2017   Procedure: EXPLORATORY LAPAROTOMY for SBO, ileocolectomy, removal of  piece of uterine wall;  Surgeon: [name], MD;  Location: [facility] ORS;  Service: General;  Laterality: N/A;   LASER ABLATION CONDOLAMATA N/A 02/22/2018   Procedure: LASER ABLATION/REMOVAL OF [id number] AROUND ANUS AND VAGINA;  Surgeon: Michael Boston, MD;  Location: [place];  Service: General;  Laterality: N/A;   OOPHORECTOMY     PORTA CATH INSERTION N/A 05/13/2018   Procedure: PORTA CATH INSERTION;  Surgeon: Katha Cabal, MD;  Location: [place] CV LAB;  Service: Cardiovascular;  Laterality: N/A;   SMALL INTESTINE SURGERY     TANDEM RING INSERTION     x3   THORACOTOMY     TOTAL KNEE ARTHROPLASTY Left 04/24/2018   Procedure: TOTAL KNEE ARTHROPLASTY;  Surgeon: Lovell Sheehan, MD;  Location: [facility] ORS;  Service: Orthopedics;  Laterality: Left;    Prior to Admission medications   Medication Sig Start Date End Date Taking? Authorizing Provider  amitriptyline (ELAVIL) 75 MG tablet Take 1 tablet (75 mg total) by mouth at bedtime. 05/06/19 08/04/19 Yes Vanga, Tally Due, MD  Calcium Carb-Cholecalciferol (CALCIUM 500 +D) 500-400 MG-UNIT TABS Take 2 tablets by mouth daily.   Yes [provider]  diphenoxylate-atropine (LOMOTIL) 2.5-0.025 MG tablet TAKE (2) TABLETS FOUR TIMES DAILY. 04/21/19  Yes Vanga, Tally Due, MD  hydrOXYzine (ATARAX/VISTARIL) 10 MG tablet Take 1 tablet (10 mg total) by mouth 3 (three) times daily as needed. 05/08/19  Yes Cammie Sickle, MD  hyoscyamine (LEVBID) 0.375 MG 12 hr tablet Take 1 tablet (0.375 mg total)by mouth 2 (two) times daily. 01/03/19  Yes Vanga, Tally Due, MD  Multiple Vitamins-Minerals (MULTIVITAMIN WITH MINERALS) tablet Take 1 tablet by mouth daily. 08/01/18 08/01/19 Yes Wieting, Richard, MD  ondansetron (ZOFRAN ODT) 4 MG disintegrating tablet Take 1 tablet (4 mg total) by mouth every 8 (eight) hours as needed for nausea or vomiting. 03/20/19  Yes Verlon Au, NP  oxyCODONE (OXYCONTIN) 15 mg 12 hr tablet  Take 1 tablet (15 mg total) by mouth every 12 (twelve) hours. 05/29/19  Yes Cammie Sickle, MD  Oxycodone HCl 10 MG TABS Take 1 tablet (10 mg total) by mouth every 6 (six) hours as needed (for breakthrough pain). 06/06/19  Yes Jacquelin Hawking, NP  pantoprazole (PROTONIX) 40 MG tablet Take 1 tablet (40 mg total) by mouth daily. 03/18/19  Yes Cammie Sickle, MD  potassium chloride SA (K-DUR) 20 MEQ tablet Take 1 tablet (20 mEq total) by mouth 2 (two) times daily. 05/22/19  Yes Epifanio Lesches, MD  rivaroxaban (XARELTO) 20 MG TABS tablet Take 1 tablet (20 mg total) by mouth daily with supper. Patient taking differently: Take 20 mg by mouth daily with supper. Takes in the morning 05/05/19  Yes Cammie Sickle, MD  famotidine (PEPCID) 20 MG tablet Take 1 tablet (20 mg total) by mouth 2 (two) times daily for 5 days. 05/23/19 05/28/19  Duffy Bruce, MD  feeding supplement, ENSURE ENLIVE, (ENSURE ENLIVE) LIQD Take 237 mLs by mouth 3 (three) times daily between meals. 05/22/19   Epifanio Lesches, MD  meclizine (ANTIVERT) 25 MG tablet Take 1 tablet (25 mg total) by mouth 2 (two) times daily as needed for dizziness. Patient not taking: Reported on 06/07/2019 01/26/19   Merlyn Lot, MD  promethazine (PHENERGAN) 25 MG tablet Take 1 tablet (25 mg total) by mouth every 8 (eight) hours as needed for nausea or vomiting. 03/05/19   Earlie Server, MD  sucralfate (CARAFATE) 1 g tablet Take 1 tablet (1 g total) by mouth 4 (four) times daily for 5 days. 05/23/19 05/28/19  Duffy Bruce, MD  VENTOLIN HFA 108 (90 Base) MCG/ACT inhaler Inhale 1-2 puffs into the lungs every 4 (four) hours as needed for shortness of breath. 02/19/19   Verlon Au, NP    Allergies Ketamine  Family History  Problem Relation Age of Onset   Hypertension Father    Diabetes Father    Alcohol abuse Daughter    Hypertension Maternal Grandmother    Diabetes Maternal Grandmother    Diabetes Paternal Grandmother     Hypertension Paternal Grandmother     Social History Social History   Tobacco Use   Smoking status: Former Smoker    Quit date: 10/16/2006    Years since quitting: 12.6   Smokeless tobacco: Never Used  Substance Use Topics   Alcohol use: Not Currently    Frequency: Never    Comment: seldom   Drug use: Yes    Types: Marijuana    Comment: not very often    Review of Systems  Review of Systems  Constitutional: Positive for fatigue. Negative for fever.  HENT: Negative for congestion and sore throat.   Eyes: Negative for visual disturbance.  Respiratory: Negative for cough and shortness of breath.  Cardiovascular: Negative for chest pain.  Gastrointestinal: Positive for abdominal pain, diarrhea, nausea and vomiting.  Genitourinary: Negative for flank pain.  Musculoskeletal: Negative for back pain and neck pain.  Skin: Negative for rash and wound.  Neurological: Positive for weakness and light-headedness.  All other systems reviewed and are negative.    ____________________________________________  PHYSICAL EXAM:      VITAL SIGNS: ED Triage Vitals  Enc Vitals Group     BP 06/07/19 0949 (!) 147/91     Pulse Rate 06/07/19 0949 86     Resp 06/07/19 0949 18     Temp 06/07/19 0949 97.7 F (36.5 C)     Temp Source 06/07/19 0949 Oral     SpO2 06/07/19 0949 99 %     Weight 06/07/19 0947 163 lb 2.3 oz (74 kg)     Height 06/07/19 0947 5' 2"  (1.575 m)     Head Circumference --      Peak Flow --      Pain Score 06/07/19 0947 8     Pain Loc --      Pain Edu? --      Excl. in [place]? --      Physical Exam Vitals signs and nursing note reviewed.  Constitutional:      General: She is not in acute distress.    Appearance: She is well-developed.  HENT:     Head: Normocephalic and atraumatic.     Comments: Dry MM Eyes:     Conjunctiva/sclera: Conjunctivae normal.  Neck:     Musculoskeletal: Neck supple.  Cardiovascular:     Rate and Rhythm: Normal rate and regular  rhythm.     Heart sounds: Normal heart sounds. No murmur. No friction rub.  Pulmonary:     Effort: Pulmonary effort is normal. No respiratory distress.     Breath sounds: Normal breath sounds. No wheezing or rales.  Abdominal:     General: Bowel sounds are normal. There is no distension.     Palpations: Abdomen is soft.     Tenderness: There is generalized abdominal tenderness. There is no guarding or rebound.  Skin:    General: Skin is warm.     Capillary Refill: Capillary refill takes less than 2 seconds.  Neurological:     Mental Status: She is alert and oriented to person, place, and time.     Motor: No abnormal muscle tone.       ____________________________________________   LABS (all labs ordered are listed, but only abnormal results are displayed)  Labs Reviewed  CBC WITH DIFFERENTIAL/PLATELET - Abnormal; Notable for the following components:      Result Value   MCV 101.0 (*)    All other components within normal limits  COMPREHENSIVE METABOLIC PANEL - Abnormal; Notable for the following components:   AST 48 (*)    ALT 67 (*)    Alkaline Phosphatase 170 (*)    All other components within normal limits  MAGNESIUM - Abnormal; Notable for the following components:   Magnesium 1.4 (*)    All other components within normal limits  CULTURE, BLOOD (ROUTINE X 2)  CULTURE, BLOOD (ROUTINE X 2)  SARS CORONAVIRUS 2  GASTROINTESTINAL PANEL BY PCR, STOOL (REPLACES STOOL CULTURE)  C DIFFICILE QUICK SCREEN W PCR REFLEX  LIPASE, BLOOD  BASIC METABOLIC PANEL  CBC  MAGNESIUM    ____________________________________________  EKG: NSR, VR 98. RAE, possible RVH. No acute ST-T segment changes. Normal intervals, QTc 432. ________________________________________  RADIOLOGY All imaging,  including plain films, CT scans, and ultrasounds, independently reviewed by me, and interpretations confirmed via formal radiology reads.  ED MD interpretation:   CT Head: NAICA XR Hip:  Neg CT A/P: Colitis, pulmonary mets, s/p chole  Official radiology report(s): Ct Head Wo Contrast  Result Date: 06/07/2019 CLINICAL DATA:  Headache, history of metastatic cervical cancer EXAM: CT HEAD WITHOUT CONTRAST TECHNIQUE: Contiguous axial images were obtained from the base of the skull through the vertex without intravenous contrast. COMPARISON:  05/18/2019 FINDINGS: Brain: No evidence of acute infarction, hemorrhage, hydrocephalus, extra-axial collection or mass lesion/mass effect. Vascular: No hyperdense vessel or unexpected calcification. Skull: Normal. Negative for fracture or focal lesion. Sinuses/Orbits: The visualized paranasal sinuses are essentially clear. The mastoid air cells are unopacified. Other: None. IMPRESSION: Normal head CT. Electronically Signed   By: Julian Hy M.D.   On: 06/07/2019 11:04   Ct Abdomen Pelvis W Contrast  Result Date: 06/07/2019 CLINICAL DATA:  Metastatic cervical cancer, nausea, vomiting and diarrhea EXAM: CT ABDOMEN AND PELVIS WITH CONTRAST TECHNIQUE: Multidetector CT imaging of the abdomen and pelvis was performed using the standard protocol following bolus administration of intravenous contrast. CONTRAST:  153m OMNIPAQUE IOHEXOL 300 MG/ML  SOLN COMPARISON:  04/21/2019 FINDINGS: Lower chest: Continue progression of lower chest numerous bilateral pulmonary metastases. These have increased in size and number. Some are cavitary. No pleural effusion. No superimposed lower lobe consolidation or collapse. Normal heart size. No pericardial effusion. Hepatobiliary: Stable intra and extrahepatic biliary dilatation following cholecystectomy. No focal hepatic abnormality. Pancreas: Unremarkable. No pancreatic ductal dilatation or surrounding inflammatory changes. Spleen: Normal in size without focal abnormality. Adrenals/Urinary Tract: Adrenal glands are unremarkable. Kidneys are normal, focal lesion, or hydronephrosis. Bladder is unremarkable. Punctate  nonobstructing subcentimeter bilateral intrarenal calculi. Stomach/Bowel: Negative for bowel obstruction, significant dilatation, ileus, or free air. Patient appears status post right colectomy. Distal colon demonstrates mild wall thickening of the descending colon, sigmoid rectum mucosal enhancement, similar to the prior study which may represent colitis. This could be related to prior pelvic radiation. No fluid collection, hemorrhage, hematoma, abscess, or ascites. Vascular/Lymphatic: Abdominal atherosclerosis without aneurysm occlusive process. No veno-occlusive disease. Mesenteric and renal vasculature appear patent. No bulky adenopathy. Reproductive: Atrophic uterus. No adnexal abnormality. No pelvic free fluid, fluid collection, or abscess. Other: Intact abdominal wall.  No hernia. Musculoskeletal: Osteopenia and degenerative change with associated scoliosis. No acute osseous finding. IMPRESSION: Continue progression of pulmonary metastases. Stable biliary dilatation following cholecystectomy Similar distal colonic wall thickening and mucosal enhancement predominately of the sigmoid and rectum compatible with colitis, possibly secondary to radiation. Abdominal atherosclerosis without aneurysm Negative for bowel obstruction, free, or abscess. Nonobstructing nephrolithiasis. Electronically Signed   By: MJerilynn Mages  Shick M.D.   On: 06/07/2019 12:32   Dg Hip Unilat W Or Wo Pelvis 2-3 Views Right  Result Date: 06/07/2019 CLINICAL DATA:  Right hip pain for 1 week. No reported injury. History of pseudogout. EXAM: DG HIP (WITH OR WITHOUT PELVIS) 2-3V RIGHT COMPARISON:  04/21/2027 CT abdomen/pelvis. FINDINGS: There is no evidence of hip fracture or dislocation. There is no evidence of significant arthropathy or other focal bone abnormality. Degenerative changes in the visualized lower lumbar spine. IMPRESSION: No acute osseous abnormality. Electronically Signed   By: JIlona SorrelM.D.   On: 06/07/2019 11:58     ____________________________________________  PROCEDURES   Procedure(s) performed (including Critical Care):  Procedures  ____________________________________________  INITIAL IMPRESSION / MDM / AFowler/ ED COURSE  As part of my medical decision making,  I reviewed the following data within the electronic MEDICAL RECORD NUMBER Notes from prior ED visits and [place] Controlled Substance Database      *NEEKA URISTA was evaluated in Emergency Department on 06/07/2019 for the symptoms described in the history of present illness. She was evaluated in the context of the global COVID-19 pandemic, which necessitated consideration that the patient might be at risk for infection with the SARS-CoV-2 virus that causes COVID-19. Institutional protocols and algorithms that pertain to the evaluation of patients at risk for COVID-19 are in a state of rapid change based on information released by regulatory bodies including the CDC and federal and state organizations. These policies and algorithms were followed during the patient's care in the ED.  Some ED evaluations and interventions may be delayed as a result of limited staffing during the pandemic.*   Clinical Course as of Jun 06 2309  Sat Jun 07, 2019  1142 53 yo F here with n/v/d in setting of recent dose of Truvada. Suspect acute on chronic n/v/d with dehydration and hypomagnesemia 2/2 cancer tx. Discussed with Dr. Burlene Arnt, pt's oncologist, who also recommend CT A/P given possible AE of colitis with Truvada. Will plan to admit.   [CI]    Clinical Course User Index [CI] Duffy Bruce, MD    Medical Decision Making: CT scan shows ongoing colitis. ABX given. No perf. No other complications.  ____________________________________________  FINAL CLINICAL IMPRESSION(S) / ED DIAGNOSES  Final diagnoses:  Colitis  Hypomagnesemia     MEDICATIONS GIVEN DURING THIS VISIT:  Medications  oxyCODONE (OXYCONTIN) 12 hr tablet 15 mg  (15 mg Oral Given 06/07/19 1651)  oxyCODONE (Oxy IR/ROXICODONE) immediate release tablet 10 mg (10 mg Oral Given 06/07/19 1656)  amitriptyline (ELAVIL) tablet 75 mg (75 mg Oral Given 06/07/19 2228)  hydrOXYzine (ATARAX/VISTARIL) tablet 10 mg (has no administration in time range)  famotidine (PEPCID) tablet 20 mg (20 mg Oral Given 06/07/19 2228)  hyoscyamine (LEVBID) 0.375 MG 12 hr tablet 0.375 mg (0.375 mg Oral Given 06/07/19 2228)  pantoprazole (PROTONIX) EC tablet 40 mg (40 mg Oral Given 06/07/19 1654)  sucralfate (CARAFATE) tablet 1 g (1 g Oral Given 06/07/19 2228)  rivaroxaban (XARELTO) tablet 20 mg (20 mg Oral Not Given 06/07/19 1636)  calcium-vitamin D (OSCAL WITH D) 500-200 MG-UNIT per tablet 2 tablet (2 tablets Oral Given 06/07/19 1650)  feeding supplement (ENSURE ENLIVE) (ENSURE ENLIVE) liquid 237 mL (237 mLs Oral Not Given 06/07/19 2013)  multivitamin with minerals tablet 1 tablet (1 tablet Oral Given 06/07/19 1654)  albuterol (PROVENTIL) (2.5 MG/3ML) 0.083% nebulizer solution 2.5 mg (has no administration in time range)  0.9 %  sodium chloride infusion ( Intravenous New Bag/Given 06/07/19 1607)  acetaminophen (TYLENOL) tablet 650 mg (has no administration in time range)    Or  acetaminophen (TYLENOL) suppository 650 mg (has no administration in time range)  ondansetron (ZOFRAN) tablet 4 mg ( Oral See Alternative 06/07/19 1656)    Or  ondansetron (ZOFRAN) injection 4 mg (4 mg Intravenous Given 06/07/19 1656)  metroNIDAZOLE (FLAGYL) IVPB 500 mg (500 mg Intravenous New Bag/Given 06/07/19 2227)  promethazine (PHENERGAN) injection 25 mg (has no administration in time range)  ciprofloxacin (CIPRO) IVPB 400 mg (has no administration in time range)  sodium chloride 0.9 % bolus 1,000 mL ( Intravenous Stopped 06/07/19 1118)  ondansetron (ZOFRAN) injection 4 mg (4 mg Intravenous Given 06/07/19 1023)  HYDROmorphone (DILAUDID) injection 1 mg (1 mg Intravenous Given 06/07/19 1023)  sodium chloride 0.9 % bolus  1,000 mL (0 mLs Intravenous Stopped 06/07/19 1121)  0.9 %  sodium chloride infusion ( Intravenous Stopped 06/07/19 1605)  magnesium sulfate IVPB 2 g 50 mL ( Intravenous Stopped 06/07/19 1204)  iohexol (OMNIPAQUE) 300 MG/ML solution 100 mL (100 mLs Intravenous Contrast Given 06/07/19 1136)  HYDROmorphone (DILAUDID) injection 1 mg (1 mg Intravenous Given 06/07/19 1204)  ciprofloxacin (CIPRO) IVPB 400 mg ( Intravenous Stopped 06/07/19 1545)  metroNIDAZOLE (FLAGYL) IVPB 500 mg (500 mg Intravenous New Bag/Given 06/07/19 1706)     ED Discharge Orders    None       Note:  This document was prepared using Dragon voice recognition software and may include unintentional dictation errors.   Duffy Bruce, MD 06/07/19 [phone]

## 2019-06-07 NOTE — ED Notes (Signed)
Blood culture attempted x 2 without success.  Will have another staff member attempt.

## 2019-06-07 NOTE — Consult Note (Signed)
Pharmacy Antibiotic Note  Joanna Hall is a 53 y.o. female admitted on 06/07/2019 with intra-abdominal infection.  Pharmacy has been consulted for ciprofloxacin dosing. Patient is also on metronidazole.   Plan: Ciprofloxacin 400 mg IV q12h   Height: 5' 2"  (157.5 cm) Weight: 163 lb 2.3 oz (74 kg) IBW/kg (Calculated) : 50.1  Temp (24hrs), Avg:97.7 F (36.5 C), Min:97.7 F (36.5 C), Max:97.7 F (36.5 C)  Recent Labs  Lab 06/05/19 0806 06/07/19 1000  WBC 5.1 4.8  CREATININE 0.77 0.78    Estimated Creatinine Clearance: 78.4 mL/min (by C-G formula based on SCr of 0.78 mg/dL).    Allergies  Allergen Reactions  . Ketamine Anxiety and Other (See Comments)    Syncope episode/confusion     Antimicrobials this admission: Ciprofloxacin 8/22 >>    Dose adjustments this admission: N/A  Microbiology results: 8/22 Bcx: pending  Thank you for allowing pharmacy to be a part of this patient's care.  Benita Gutter 06/07/2019 2:32 PM

## 2019-06-07 NOTE — ED Notes (Signed)
Ok to only obtain one blood culture per dr Ellender Hose. Pt is very hard stick. Was stuck by previous RN X 2. This RN stuck X 2 to obtain one blood culture.

## 2019-06-07 NOTE — ED Notes (Signed)
FIRST NURSE NOTE: Pt sent by [facility] MD to come to ED due vomiting and abdominal pain.

## 2019-06-07 NOTE — Telephone Encounter (Signed)
Pt called. Pt has chronic problems with abdominal discomfort/ diarrhea/nausea. However, pt's GI symptoms are significantly worse- with poor intake. Recommend evaluation in the ER.   Pt received Keytruda/immunotherapy cycle #2 on 08/13.   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 Pt also has a history of adrenal insufficiency on baseline steroids.

## 2019-06-07 NOTE — H&P (Signed)
[place] [facility] [place] NAME: Sequoya Hogsett    MR#:  [id number]  DATE OF BIRTH:  14-Mar-1966  DATE OF ADMISSION:  06/07/2019  PRIMARY CARE PHYSICIAN: [facility] - [place], [place]   REQUESTING/REFERRING PHYSICIAN: Duffy Bruce, MD  CHIEF COMPLAINT:   Chief Complaint  Patient presents with  . Emesis  . Diarrhea    HISTORY OF PRESENT ILLNESS:  Kresta Templeman  is a 53 y.o. female with a known history of metastatic cervical cancer, factor V leiden mutation, chronic hepatitis C, anxiety who presented to the ED with nausea and vomiting that started yesterday morning.  Patient states she has vomited about 15 times.  She also endorses a worsening of her chronic diarrhea.  She states that her stool is light brown and yellow with some mucus in it.  No hematochezia, melena, or hematemesis.  She denies any fevers or chills.  She endorses right lower quadrant abdominal pain.  She denies any chest pain or shortness of breath.  In the ED, vitals were unremarkable. Labs were significant for magnesium 1.4, AST 48, ALT 67. CT head was unremarkable. CT abdomen/pelvis was pending. She was given cipro and flagyl and IVFs. Hospitalists were called for admission.  PAST MEDICAL HISTORY:   Past Medical History:  Diagnosis Date  . Abdominal pain 06/10/2018  . Abnormal cervical Papanicolaou smear 09/18/2017  . Anxiety   . Aortic atherosclerosis ([place])   . Arthritis    neck and knees  . 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 Blood transfusion without reported diagnosis   . Cervical cancer ([place]) 09/2016   mets lung  . Chronic anal fissure   . Chronic diarrhea   . Dyspnea   . Erosive gastropathy 09/18/2017  . Factor V Leiden mutation ([place])   . Fecal incontinence   . Genital warts   . GERD (gastroesophageal reflux disease)   . GI bleed 10/08/2018  . Heart murmur   . Hematochezia   . Hemorrhoids   . 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 Hepatitis, chronic ([place]) 05/05/2017  . History of cancer chemotherapy    completed 06/2017  . History of Clostridium difficile infection    while undergoing chemo.  Negative test 10/2017  . Ileocolic anastomotic leak   . 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 Intestinal infection due to Clostridium difficile 09/18/2017  . Macrocytic anemia with vitamin B12 deficiency   . Multiple gastric ulcers   . Nausea vomiting and diarrhea   . Pancolitis ([place]) 07/27/2018  . Perianal condylomata   . Pneumonia    History of  . Pulmonary nodules   . Rectal bleeding   . Small bowel obstruction ([place]) 08/2017  . Stiff neck    limited right turn  . Vitamin D deficiency     PAST SURGICAL HISTORY:   Past Surgical History:  Procedure Laterality Date  . CHOLECYSTECTOMY    . COLON SURGERY  08/2017   resection  . COLONOSCOPY WITH PROPOFOL N/A 12/20/2017   Procedure: COLONOSCOPY WITH PROPOFOL;  Surgeon: [name], MD;  Location: [facility] ENDOSCOPY;  Service: Gastroenterology;  Laterality: N/A;  . COLONOSCOPY WITH PROPOFOL N/A 07/30/2018   Procedure: COLONOSCOPY WITH PROPOFOL;  Surgeon: [name], MD;  Location: [facility] ENDOSCOPY;  Service: Gastroenterology;  Laterality: N/A;  . COLONOSCOPY WITH PROPOFOL N/A 10/10/2018   Procedure:  COLONOSCOPY WITH PROPOFOL;  Surgeon: Lucilla Lame, MD;  Location: [facility] ENDOSCOPY;  Service: Endoscopy;  Laterality: N/A;  . DIAGNOSTIC LAPAROSCOPY    . ESOPHAGOGASTRODUODENOSCOPY (EGD) WITH PROPOFOL N/A 12/20/2017   Procedure: ESOPHAGOGASTRODUODENOSCOPY (EGD) WITH PROPOFOL;  Surgeon: [name], MD;  Location: [place];  Service: Gastroenterology;  Laterality: N/A;  . ESOPHAGOGASTRODUODENOSCOPY (EGD) WITH PROPOFOL  07/30/2018   Procedure: ESOPHAGOGASTRODUODENOSCOPY (EGD) WITH PROPOFOL;  Surgeon: [name], MD;  Location: [facility] ENDOSCOPY;  Service: Gastroenterology;;  . Otho Darner SIGMOIDOSCOPY N/A  11/21/2018   Procedure: FLEXIBLE SIGMOIDOSCOPY;  Surgeon: [name], MD;  Location: [facility] ENDOSCOPY;  Service: Gastroenterology;  Laterality: N/A;  . LAPAROTOMY N/A 08/31/2017   Procedure: EXPLORATORY LAPAROTOMY for SBO, ileocolectomy, removal of piece of uterine wall;  Surgeon: [name], MD;  Location: [facility] ORS;  Service: General;  Laterality: N/A;  . LASER ABLATION CONDOLAMATA N/A 02/22/2018   Procedure: LASER ABLATION/REMOVAL OF [id number] AROUND ANUS AND VAGINA;  Surgeon: Michael Boston, MD;  Location: [place];  Service: General;  Laterality: N/A;  . OOPHORECTOMY    . PORTA CATH INSERTION N/A 05/13/2018   Procedure: PORTA CATH INSERTION;  Surgeon: Katha Cabal, MD;  Location: [place] CV LAB;  Service: Cardiovascular;  Laterality: N/A;  . SMALL INTESTINE SURGERY    . TANDEM RING INSERTION     x3  . THORACOTOMY    . TOTAL KNEE ARTHROPLASTY Left 04/24/2018   Procedure: TOTAL KNEE ARTHROPLASTY;  Surgeon: Lovell Sheehan, MD;  Location: [facility] ORS;  Service: Orthopedics;  Laterality: Left;    SOCIAL HISTORY:   Social History   Tobacco Use  . Smoking status: Former Smoker    Quit date: 10/16/2006    Years since quitting: 12.6  . Smokeless tobacco: Never Used  Substance Use Topics  . Alcohol use: Not Currently    Frequency: Never    Comment: seldom    FAMILY HISTORY:   Family History  Problem Relation Age of Onset  . Hypertension Father   . Diabetes Father   . Alcohol abuse Daughter   . Hypertension Maternal Grandmother   . Diabetes Maternal Grandmother   . Diabetes Paternal Grandmother   . Hypertension Paternal Grandmother     DRUG ALLERGIES:   Allergies  Allergen Reactions  . Ketamine Anxiety and Other (See Comments)    Syncope episode/confusion     REVIEW OF SYSTEMS:   Review of Systems  Constitutional: Negative for chills and fever.  HENT: Negative for congestion and sore throat.   Eyes: Negative for blurred vision and  double vision.  Respiratory: Negative for cough and shortness of breath.   Cardiovascular: Negative for chest pain and palpitations.  Gastrointestinal: Positive for abdominal pain, diarrhea, nausea and vomiting. Negative for blood in stool and melena.  Genitourinary: Negative for dysuria and urgency.  Musculoskeletal: Positive for joint pain. Negative for back pain and neck pain.  Neurological: Negative for dizziness and headaches.  Psychiatric/Behavioral: Negative for depression. The patient is not nervous/anxious.     MEDICATIONS AT HOME:   Prior to Admission medications   Medication Sig Start Date End Date Taking? Authorizing Provider  amitriptyline (ELAVIL) 75 MG tablet Take 1 tablet (75 mg total) by mouth at bedtime. 05/06/19 08/04/19 Yes Vanga, Tally Due, MD  Calcium Carb-Cholecalciferol (CALCIUM 500 +D) 500-400 MG-UNIT TABS Take 2 tablets by mouth daily.   Yes [provider]  diphenoxylate-atropine (LOMOTIL) 2.5-0.025 MG tablet TAKE (2) TABLETS FOUR TIMES DAILY. 04/21/19  Yes Vanga, Tally Due,  MD  hydrOXYzine (ATARAX/VISTARIL) 10 MG tablet Take 1 tablet (10 mg total) by mouth 3 (three) times daily as needed. 05/08/19  Yes Cammie Sickle, MD  hyoscyamine (LEVBID) 0.375 MG 12 hr tablet Take 1 tablet (0.375 mg total)by mouth 2 (two) times daily. 01/03/19  Yes Vanga, Tally Due, MD  Multiple Vitamins-Minerals (MULTIVITAMIN WITH MINERALS) tablet Take 1 tablet by mouth daily. 08/01/18 08/01/19 Yes Wieting, Richard, MD  ondansetron (ZOFRAN ODT) 4 MG disintegrating tablet Take 1 tablet (4 mg total) by mouth every 8 (eight) hours as needed for nausea or vomiting. 03/20/19  Yes Verlon Au, NP  oxyCODONE (OXYCONTIN) 15 mg 12 hr tablet Take 1 tablet (15 mg total) by mouth every 12 (twelve) hours. 05/29/19  Yes Cammie Sickle, MD  Oxycodone HCl 10 MG TABS Take 1 tablet (10 mg total) by mouth every 6 (six) hours as needed (for breakthrough pain). 06/06/19  Yes Jacquelin Hawking, NP  pantoprazole (PROTONIX) 40 MG tablet Take 1 tablet (40 mg total) by mouth daily. 03/18/19  Yes Cammie Sickle, MD  potassium chloride SA (K-DUR) 20 MEQ tablet Take 1 tablet (20 mEq total) by mouth 2 (two) times daily. 05/22/19  Yes Epifanio Lesches, MD  rivaroxaban (XARELTO) 20 MG TABS tablet Take 1 tablet (20 mg total) by mouth daily with supper. Patient taking differently: Take 20 mg by mouth daily with supper. Takes in the morning 05/05/19  Yes Cammie Sickle, MD  famotidine (PEPCID) 20 MG tablet Take 1 tablet (20 mg total) by mouth 2 (two) times daily for 5 days. 05/23/19 05/28/19  Duffy Bruce, MD  feeding supplement, ENSURE ENLIVE, (ENSURE ENLIVE) LIQD Take 237 mLs by mouth 3 (three) times daily between meals. 05/22/19   Epifanio Lesches, MD  meclizine (ANTIVERT) 25 MG tablet Take 1 tablet (25 mg total) by mouth 2 (two) times daily as needed for dizziness. Patient not taking: Reported on 06/07/2019 01/26/19   Merlyn Lot, MD  promethazine (PHENERGAN) 25 MG tablet Take 1 tablet (25 mg total) by mouth every 8 (eight) hours as needed for nausea or vomiting. 03/05/19   Earlie Server, MD  sucralfate (CARAFATE) 1 g tablet Take 1 tablet (1 g total) by mouth 4 (four) times daily for 5 days. 05/23/19 05/28/19  Duffy Bruce, MD  VENTOLIN HFA 108 (90 Base) MCG/ACT inhaler Inhale 1-2 puffs into the lungs every 4 (four) hours as needed for shortness of breath. 02/19/19   Verlon Au, NP      VITAL SIGNS:  Blood pressure 119/79, pulse 88, temperature 97.7 F (36.5 C), temperature source Oral, resp. rate 11, height 5' 2"  (1.575 m), weight 74 kg, SpO2 99 %.  PHYSICAL EXAMINATION:  Physical Exam  GENERAL:  53 y.o.-year-old patient lying in the bed with no acute distress.  Thin and chronically ill-appearing. EYES: Pupils equal, round, reactive to light and accommodation. No scleral icterus. Extraocular muscles intact.  HEENT: Head atraumatic, normocephalic. Oropharynx and  nasopharynx clear.  NECK:  Supple, no jugular venous distention. No thyroid enlargement, no tenderness.  LUNGS: Normal breath sounds bilaterally, no wheezing, rales,rhonchi or crepitation. No use of accessory muscles of respiration.  CARDIOVASCULAR: RRR, S1, S2 normal. No murmurs, rubs, or gallops.  ABDOMEN: Soft, nondistended. Bowel sounds present. No organomegaly or mass. + Tenderness to palpation across the entire lower abdomen, no rebound or guarding.  Murphy sign negative.  No tenderness over McBurney's point. EXTREMITIES: No pedal edema, cyanosis, or clubbing. + Mild swelling of the left  shoulder, no erythema or warmth. NEUROLOGIC: Cranial nerves II through XII are intact. Muscle strength 5/5 in all extremities. Sensation intact. Gait not checked.  PSYCHIATRIC: The patient is alert and oriented x 3.  SKIN: No obvious rash, lesion, or ulcer.   LABORATORY PANEL:   CBC Recent Labs  Lab 06/07/19 1000  WBC 4.8  HGB 13.7  HCT 41.8  PLT 205   ------------------------------------------------------------------------------------------------------------------  Chemistries  Recent Labs  Lab 06/07/19 1000  NA 135  K 3.6  CL 100  CO2 22  GLUCOSE 85  BUN 14  CREATININE 0.78  CALCIUM 9.8  MG 1.4*  AST 48*  ALT 67*  ALKPHOS 170*  BILITOT 0.8   ------------------------------------------------------------------------------------------------------------------  Cardiac Enzymes No results for input(s): TROPONINI in the last 168 hours. ------------------------------------------------------------------------------------------------------------------  RADIOLOGY:  Ct Head Wo Contrast  Result Date: 06/07/2019 CLINICAL DATA:  Headache, history of metastatic cervical cancer EXAM: CT HEAD WITHOUT CONTRAST TECHNIQUE: Contiguous axial images were obtained from the base of the skull through the vertex without intravenous contrast. COMPARISON:  05/18/2019 FINDINGS: Brain: No evidence of acute  infarction, hemorrhage, hydrocephalus, extra-axial collection or mass lesion/mass effect. Vascular: No hyperdense vessel or unexpected calcification. Skull: Normal. Negative for fracture or focal lesion. Sinuses/Orbits: The visualized paranasal sinuses are essentially clear. The mastoid air cells are unopacified. Other: None. IMPRESSION: Normal head CT. Electronically Signed   By: Julian Hy M.D.   On: 06/07/2019 11:04   Dg Hip Unilat W Or Wo Pelvis 2-3 Views Right  Result Date: 06/07/2019 CLINICAL DATA:  Right hip pain for 1 week. No reported injury. History of pseudogout. EXAM: DG HIP (WITH OR WITHOUT PELVIS) 2-3V RIGHT COMPARISON:  04/21/2027 CT abdomen/pelvis. FINDINGS: There is no evidence of hip fracture or dislocation. There is no evidence of significant arthropathy or other focal bone abnormality. Degenerative changes in the visualized lower lumbar spine. IMPRESSION: No acute osseous abnormality. Electronically Signed   By: Ilona Sorrel M.D.   On: 06/07/2019 11:58      IMPRESSION AND PLAN:   Intractable nausea / vomiting / diarrhea- likely secondary to colitis in the setting of Keytruda use.  Patient just restarted Keytruda 1 week ago. -CT abdomen/pelvis ordered -IV fluids -IV antiemetics -Pain control -Oncology consult -GI pathogen panel and C. Difficile -Continue Cipro and Flagyl for now, can likely discontinue if GI pathogen panel is negative  Hypomagnesemia-likely due to above -Replete and recheck  Metastatic cervical cancer -Oncology consult -Continue home pain meds  Chronic hepatitis C- liver enzymes are elevated, which is baseline for her -Monitor  Factor V Leiden mutation -Continue home Xarelto  All the records are reviewed and case discussed with ED provider. Management plans discussed with the patient, family and they are in agreement.  CODE STATUS: Full  TOTAL TIME TAKING CARE OF THIS PATIENT: 45 minutes.    Berna Spare Mayo M.D on 06/07/2019 at 1:25 PM   Between 7am to 6pm - Pager - [phone]  After 6pm go to [email or web address] - Proofreader  [employer] [place] [facility]  Office  [phone]  CC: Primary care physician; [place]   Note: This dictation was prepared with Dragon dictation along with smaller phrase technology. Any transcriptional errors that result from this process are unintentional.

## 2019-06-07 NOTE — ED Triage Notes (Signed)
PT to ER with c/o vomiting and diarrhea since yesterday.  PT called cancer center on-call and was advised to come to ER.

## 2019-06-07 NOTE — ED Notes (Signed)
Patient transported to CT 

## 2019-06-07 NOTE — ED Notes (Signed)
Patient transported to X-ray 

## 2019-06-07 NOTE — Plan of Care (Signed)
Pt admitted from the ED. VSS. Oxycodone given once for L shoulder pain with improvement. Zofran given once for nausea. Nausea decreased per pt. No emesis. Pt tolerated clear liquid diet. Enteric precautions initiated. No BM yet.

## 2019-06-08 DIAGNOSIS — C539 Malignant neoplasm of cervix uteri, unspecified: Secondary | ICD-10-CM

## 2019-06-08 DIAGNOSIS — R112 Nausea with vomiting, unspecified: Secondary | ICD-10-CM

## 2019-06-08 DIAGNOSIS — C7800 Secondary malignant neoplasm of unspecified lung: Secondary | ICD-10-CM

## 2019-06-08 DIAGNOSIS — R197 Diarrhea, unspecified: Secondary | ICD-10-CM

## 2019-06-08 LAB — CBC
HCT: 33.8 % — ABNORMAL LOW (ref 36.0–46.0)
Hemoglobin: 11.1 g/dL — ABNORMAL LOW (ref 12.0–15.0)
MCH: 32.9 pg (ref 26.0–34.0)
MCHC: 32.8 g/dL (ref 30.0–36.0)
MCV: 100.3 fL — ABNORMAL HIGH (ref 80.0–100.0)
Platelets: 152 10*3/uL (ref 150–400)
RBC: 3.37 MIL/uL — ABNORMAL LOW (ref 3.87–5.11)
RDW: 13.2 % (ref 11.5–15.5)
WBC: 2.5 10*3/uL — ABNORMAL LOW (ref 4.0–10.5)
nRBC: 0 % (ref 0.0–0.2)

## 2019-06-08 LAB — BASIC METABOLIC PANEL
Anion gap: 7 (ref 5–15)
BUN: 7 mg/dL (ref 6–20)
CO2: 24 mmol/L (ref 22–32)
Calcium: 9 mg/dL (ref 8.9–10.3)
Chloride: 105 mmol/L (ref 98–111)
Creatinine, Ser: 0.72 mg/dL (ref 0.44–1.00)
GFR calc Af Amer: >60 mL/min (ref >60)
GFR calc non Af Amer: >60 mL/min (ref >60)
Glucose, Bld: 69 mg/dL — ABNORMAL LOW (ref 70–99)
Potassium: 4 mmol/L (ref 3.5–5.1)
Sodium: 136 mmol/L (ref 135–145)

## 2019-06-08 LAB — TSH: TSH: 3.031 u[IU]/mL (ref 0.350–4.500)

## 2019-06-08 LAB — SARS CORONAVIRUS 2 (TAT 6-24 HRS): SARS Coronavirus 2: NEGATIVE

## 2019-06-08 LAB — MAGNESIUM: Magnesium: 1.6 mg/dL — ABNORMAL LOW (ref 1.7–2.4)

## 2019-06-08 MED ORDER — MAGNESIUM SULFATE 2 GM/50ML IV SOLN
2.0000 g | Freq: Once | INTRAVENOUS | Status: AC
  Administered 2019-06-08: 2 g via INTRAVENOUS
  Filled 2019-06-08: qty 50

## 2019-06-08 NOTE — Progress Notes (Addendum)
[facility] at [place] NAME: Joanna Hall    MR#:  [id number]  DATE OF BIRTH:  31-Oct-1965  SUBJECTIVE:  CHIEF COMPLAINT:   Chief Complaint  Patient presents with   Emesis   Diarrhea   -Still has abdominal pain radiating across the lower abdomen, nausea. -No further diarrhea today.  REVIEW OF SYSTEMS:  Review of Systems  Constitutional: Positive for malaise/fatigue. Negative for chills and fever.  HENT: Negative for congestion, ear discharge, hearing loss and nosebleeds.   Eyes: Negative for blurred vision and double vision.  Respiratory: Negative for cough, shortness of breath and wheezing.   Cardiovascular: Negative for chest pain and palpitations.  Gastrointestinal: Positive for abdominal pain, nausea and vomiting. Negative for constipation and diarrhea.  Genitourinary: Negative for dysuria.  Musculoskeletal: Negative for myalgias.  Neurological: Negative for dizziness, focal weakness, seizures, weakness and headaches.  Psychiatric/Behavioral: Negative for depression.    DRUG ALLERGIES:   Allergies  Allergen Reactions   Ketamine Anxiety and Other (See Comments)    Syncope episode/confusion     VITALS:  Blood pressure (!) 148/94, pulse 73, temperature 98.1 F (36.7 C), temperature source Oral, resp. rate 18, height 5' 8"  (1.727 m), weight 73.1 kg, SpO2 100 %.  PHYSICAL EXAMINATION:  Physical Exam   GENERAL:  53 y.o.-year-old patient lying in the bed with no acute distress.  EYES: Pupils equal, round, reactive to light and accommodation. No scleral icterus. Extraocular muscles intact.  HEENT: Head atraumatic, normocephalic. Oropharynx and nasopharynx clear.  NECK:  Supple, no jugular venous distention. No thyroid enlargement, no tenderness.  LUNGS: Normal breath sounds bilaterally, no wheezing, rales,rhonchi or crepitation. No use of accessory muscles of respiration. Decreased bibasilar breath  sounds CARDIOVASCULAR: S1, S2 normal. No murmurs, rubs, or gallops.  ABDOMEN: Soft, tender in mid abdomen- no guarding or rigidity, , nondistended. Bowel sounds present. No organomegaly or mass.  EXTREMITIES: No pedal edema, cyanosis, or clubbing.  NEUROLOGIC: Cranial nerves II through XII are intact. Muscle strength 5/5 in all extremities. Sensation intact. Gait not checked.  PSYCHIATRIC: The patient is alert and oriented x 3.  SKIN: No obvious rash, lesion, or ulcer.    LABORATORY PANEL:   CBC Recent Labs  Lab 06/08/19 0518  WBC 2.5*  HGB 11.1*  HCT 33.8*  PLT 152   ------------------------------------------------------------------------------------------------------------------  Chemistries  Recent Labs  Lab 06/07/19 1000 06/08/19 0518  NA 135 136  K 3.6 4.0  CL 100 105  CO2 22 24  GLUCOSE 85 69*  BUN 14 7  CREATININE 0.78 0.72  CALCIUM 9.8 9.0  MG 1.4* 1.6*  AST 48*  --   ALT 67*  --   ALKPHOS 170*  --   BILITOT 0.8  --    ------------------------------------------------------------------------------------------------------------------  Cardiac Enzymes No results for input(s): TROPONINI in the last 168 hours. ------------------------------------------------------------------------------------------------------------------  RADIOLOGY:  Ct Head Wo Contrast  Result Date: 06/07/2019 CLINICAL DATA:  Headache, history of metastatic cervical cancer EXAM: CT HEAD WITHOUT CONTRAST TECHNIQUE: Contiguous axial images were obtained from the base of the skull through the vertex without intravenous contrast. COMPARISON:  05/18/2019 FINDINGS: Brain: No evidence of acute infarction, hemorrhage, hydrocephalus, extra-axial collection or mass lesion/mass effect. Vascular: No hyperdense vessel or unexpected calcification. Skull: Normal. Negative for fracture or focal lesion. Sinuses/Orbits: The visualized paranasal sinuses are essentially clear. The mastoid air cells are  unopacified. Other: None. IMPRESSION: Normal head CT. Electronically Signed   By: Henderson Newcomer.D.  On: 06/07/2019 11:04   Ct Abdomen Pelvis W Contrast  Result Date: 06/07/2019 CLINICAL DATA:  Metastatic cervical cancer, nausea, vomiting and diarrhea EXAM: CT ABDOMEN AND PELVIS WITH CONTRAST TECHNIQUE: Multidetector CT imaging of the abdomen and pelvis was performed using the standard protocol following bolus administration of intravenous contrast. CONTRAST:  121m OMNIPAQUE IOHEXOL 300 MG/ML  SOLN COMPARISON:  04/21/2019 FINDINGS: Lower chest: Continue progression of lower chest numerous bilateral pulmonary metastases. These have increased in size and number. Some are cavitary. No pleural effusion. No superimposed lower lobe consolidation or collapse. Normal heart size. No pericardial effusion. Hepatobiliary: Stable intra and extrahepatic biliary dilatation following cholecystectomy. No focal hepatic abnormality. Pancreas: Unremarkable. No pancreatic ductal dilatation or surrounding inflammatory changes. Spleen: Normal in size without focal abnormality. Adrenals/Urinary Tract: Adrenal glands are unremarkable. Kidneys are normal, focal lesion, or hydronephrosis. Bladder is unremarkable. Punctate nonobstructing subcentimeter bilateral intrarenal calculi. Stomach/Bowel: Negative for bowel obstruction, significant dilatation, ileus, or free air. Patient appears status post right colectomy. Distal colon demonstrates mild wall thickening of the descending colon, sigmoid rectum mucosal enhancement, similar to the prior study which may represent colitis. This could be related to prior pelvic radiation. No fluid collection, hemorrhage, hematoma, abscess, or ascites. Vascular/Lymphatic: Abdominal atherosclerosis without aneurysm occlusive process. No veno-occlusive disease. Mesenteric and renal vasculature appear patent. No bulky adenopathy. Reproductive: Atrophic uterus. No adnexal abnormality. No pelvic free  fluid, fluid collection, or abscess. Other: Intact abdominal wall.  No hernia. Musculoskeletal: Osteopenia and degenerative change with associated scoliosis. No acute osseous finding. IMPRESSION: Continue progression of pulmonary metastases. Stable biliary dilatation following cholecystectomy Similar distal colonic wall thickening and mucosal enhancement predominately of the sigmoid and rectum compatible with colitis, possibly secondary to radiation. Abdominal atherosclerosis without aneurysm Negative for bowel obstruction, free, or abscess. Nonobstructing nephrolithiasis. Electronically Signed   By: MJerilynn Mages  Shick M.D.   On: 06/07/2019 12:32   Dg Hip Unilat W Or Wo Pelvis 2-3 Views Right  Result Date: 06/07/2019 CLINICAL DATA:  Right hip pain for 1 week. No reported injury. History of pseudogout. EXAM: DG HIP (WITH OR WITHOUT PELVIS) 2-3V RIGHT COMPARISON:  04/21/2027 CT abdomen/pelvis. FINDINGS: There is no evidence of hip fracture or dislocation. There is no evidence of significant arthropathy or other focal bone abnormality. Degenerative changes in the visualized lower lumbar spine. IMPRESSION: No acute osseous abnormality. Electronically Signed   By: JIlona SorrelM.D.   On: 06/07/2019 11:58    EKG:   Orders placed or performed in visit on 06/07/19   EKG 12-Lead   EKG 12-Lead   *Note: Due to a large number of results and/or encounters for the requested time period, some results have not been displayed. A complete set of results can be found in Results Review.    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  1. Abdominal pain with nausea and vomiting- ? Acute gastroenetritis - Improving - Also concern for underlying side effects from immunotherapy Keytruda - patient has  chronic diarrhea secondary to radiation for her cervical cancer- no loose stools now - dc c.diff, GI studies and contact isolation - advance diet today as tolerated - IV fluids - on cipro and flagyl as CT showing colitis changes in distal colon- more likely chronic and radiation colitis changes. Dc ABX at discharge  2. Metastatic cervical  cancer- s/p radiation and chemo (stopped due to intolerance - has metastatic pulmonary nodules as well - currently on palliative immunotherapy with [place]. Appreciate oncology consult  3.  Hypomagnesemia-being replaced  4.  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  Lives at home with parents.  Please encourage ambulation   All the records are reviewed and case discussed with Care Management/Social Workerr. Management plans discussed with the patient, family and they are in agreement.  CODE STATUS: Full Code  TOTAL TIME TAKING CARE OF THIS PATIENT: 38 minutes.   POSSIBLE D/C IN 1-2 DAYS, DEPENDING ON CLINICAL CONDITION.   Gladstone Lighter M.D on 06/08/2019 at 11:05 AM  Between 7am to 6pm - Pager - [phone]  After 6pm go to [email or web address] - password [email or web address] [facility] [facility]  Office  [phone]  CC: Primary care physician; [place]

## 2019-06-08 NOTE — Consult Note (Signed)
[place] NOTE  Patient Care Team: [place] as PCP - General (Family Medicine) Mellody Drown, MD as Consulting Physician (Obstetrics and Gynecology) [name], MD as Consulting Physician (Gastroenterology) Michael Boston, MD as Consulting Physician (General Surgery) Lovell Sheehan, MD as Consulting Physician (Orthopedic Surgery) Cammie Sickle, MD as Consulting Physician (Oncology) Earnestine Leys, MD as Consulting Physician (Orthopedic Surgery)  CHIEF COMPLAINTS/PURPOSE OF CONSULTATION: Cervical cancer/on immunotherapy  HISTORY OF PRESENTING ILLNESS:  Joanna Hall 53 y.o.  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  Patient received cycle #2 approximately 10 days ago.  Patient is currently admitted to hospital for intractable nausea vomiting diarrhea for the last 2 days.  Patient has longstanding history of intermittent nausea with vomiting of unclear etiology for which she has needed multiple visits with the cancer center for fluids dehydration.  Patient also has chronic diarrhea-attribute well to previous radiation-to a point where colostomy is considered for quality of life issues.  Patient had multiple endoscopies in the past.  However patient notes to have worsening severe diarrhea with multiple loose stools over the last 2 days.  She also complains of severe nausea with multiple episodes of vomiting with poor p.o. intake.  Patient has been unable to keep her nausea medications.  CT scan in the emergency room of the abdomen pelvis showed similar thickening of the sigmoid colon/rectum from previous radiation.  Otherwise showed no evidence of recurrent disease in the abdomen pelvis.  Lung  lower base images showed progression of the metastatic disease.  Patient is with IV fluids magnesium and is on Cipro Flagyl.  Patient states that she has not had bowel movement since being in the hospital.  Continues to have mild abdominal pain/right hip pain.  Review of Systems  Constitutional: Positive for malaise/fatigue and weight loss. Negative for chills, diaphoresis and fever.  HENT: Negative for nosebleeds and sore throat.   Eyes: Negative for double vision.  Respiratory: Positive for shortness of breath. Negative for cough, hemoptysis, sputum production and wheezing.   Cardiovascular: Negative for chest pain, palpitations, orthopnea and leg swelling.  Gastrointestinal: Positive for abdominal pain, diarrhea, nausea and vomiting. Negative for blood in stool, constipation, heartburn and melena.  Genitourinary: Negative for dysuria, frequency and urgency.  Musculoskeletal: Positive for back pain and joint pain.  Skin: Negative.  Negative for itching and rash.  Neurological: Negative for dizziness, tingling, focal weakness, weakness and headaches.  Endo/Heme/Allergies: Does not bruise/bleed easily.  Psychiatric/Behavioral: Negative for depression. The patient is nervous/anxious. The patient does not have insomnia.      MEDICAL HISTORY:  Past Medical History:  Diagnosis Date  . Abdominal pain 06/10/2018  . Abnormal cervical Papanicolaou smear 09/18/2017  . Anxiety   . Aortic atherosclerosis ([place])   . Arthritis    neck and knees  . 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 Blood transfusion without reported diagnosis   . Cervical cancer ([place]) 09/2016   mets lung  . Chronic anal fissure   . Chronic diarrhea   . Dyspnea   . Erosive gastropathy 09/18/2017  . Factor V Leiden mutation ([place])   . Fecal incontinence   . Genital warts   . GERD (gastroesophageal reflux disease)   . GI bleed 10/08/2018  .  Heart murmur   . Hematochezia   . Hemorrhoids   . 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 Hepatitis, chronic ([place]) 05/05/2017  . History of cancer chemotherapy    completed 06/2017  . History of Clostridium difficile infection    while undergoing chemo.  Negative test 10/2017  . Ileocolic anastomotic leak   . 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 Intestinal infection due to Clostridium difficile 09/18/2017  . Macrocytic anemia with vitamin B12 deficiency   . Multiple gastric ulcers   . Nausea vomiting and diarrhea   . Pancolitis ([place]) 07/27/2018  . Perianal condylomata   . Pneumonia    History of  . Pulmonary nodules   . Rectal bleeding   . Small bowel obstruction ([place]) 08/2017  . Stiff neck    limited right turn  . Vitamin D deficiency     SURGICAL HISTORY: Past Surgical History:  Procedure Laterality Date  . CHOLECYSTECTOMY    . COLON SURGERY  08/2017   resection  . COLONOSCOPY WITH PROPOFOL N/A 12/20/2017   Procedure: COLONOSCOPY WITH PROPOFOL;  Surgeon: [name], MD;  Location: [facility] ENDOSCOPY;  Service: Gastroenterology;  Laterality: N/A;  . COLONOSCOPY WITH PROPOFOL N/A 07/30/2018   Procedure: COLONOSCOPY WITH PROPOFOL;  Surgeon: [name], MD;  Location: [facility] ENDOSCOPY;  Service: Gastroenterology;  Laterality: N/A;  . COLONOSCOPY WITH PROPOFOL N/A 10/10/2018   Procedure: COLONOSCOPY WITH PROPOFOL;  Surgeon: Lucilla Lame, MD;  Location: [facility] ENDOSCOPY;  Service: Endoscopy;  Laterality: N/A;  . DIAGNOSTIC LAPAROSCOPY    . ESOPHAGOGASTRODUODENOSCOPY (EGD) WITH PROPOFOL N/A 12/20/2017   Procedure: ESOPHAGOGASTRODUODENOSCOPY (EGD) WITH PROPOFOL;  Surgeon: [name], MD;  Location: [place];  Service: Gastroenterology;  Laterality: N/A;  . ESOPHAGOGASTRODUODENOSCOPY (EGD) WITH PROPOFOL  07/30/2018   Procedure: ESOPHAGOGASTRODUODENOSCOPY (EGD) WITH PROPOFOL;  Surgeon: [name], MD;  Location: [facility] ENDOSCOPY;  Service: Gastroenterology;;  . Otho Darner  SIGMOIDOSCOPY N/A 11/21/2018   Procedure: FLEXIBLE SIGMOIDOSCOPY;  Surgeon: [name], MD;  Location: [facility] ENDOSCOPY;  Service: Gastroenterology;  Laterality: N/A;  . LAPAROTOMY N/A 08/31/2017   Procedure: EXPLORATORY LAPAROTOMY for SBO, ileocolectomy, removal of piece of uterine wall;  Surgeon: [name], MD;  Location: [facility] ORS;  Service: General;  Laterality: N/A;  . LASER ABLATION CONDOLAMATA N/A 02/22/2018   Procedure: LASER ABLATION/REMOVAL OF [id number] AROUND ANUS AND VAGINA;  Surgeon: Michael Boston, MD;  Location: [place];  Service: General;  Laterality: N/A;  . OOPHORECTOMY    . PORTA CATH INSERTION N/A 05/13/2018   Procedure: PORTA CATH INSERTION;  Surgeon: Katha Cabal, MD;  Location: [place] CV LAB;  Service: Cardiovascular;  Laterality: N/A;  . SMALL INTESTINE SURGERY    . TANDEM RING INSERTION     x3  . THORACOTOMY    . TOTAL KNEE ARTHROPLASTY Left 04/24/2018   Procedure: TOTAL KNEE ARTHROPLASTY;  Surgeon: Lovell Sheehan, MD;  Location: [facility] ORS;  Service: Orthopedics;  Laterality: Left;    SOCIAL HISTORY: Social History   Socioeconomic History  . Marital status: Divorced    Spouse name: Not on file  . Number of children: Not on file  . Years of education: Not on file  . Highest education level: Not on file  Occupational History  . Not on file  Social Needs  . Financial resource strain: Not hard at all  . Food insecurity    Worry: Never  true    Inability: Never true  . Transportation needs    Medical: No    Non-medical: No  Tobacco Use  . Smoking status: Former Smoker    Quit date: 10/16/2006    Years since quitting: 12.6  . Smokeless tobacco: Never Used  Substance and Sexual Activity  . Alcohol use: Not Currently    Frequency: Never    Comment: seldom  . Drug use: Yes    Types: Marijuana    Comment: not very often  . Sexual activity: Not Currently    Birth control/protection: Post-menopausal    Comment: Not  Asked  Lifestyle  . Physical activity    Days per week: Patient refused    Minutes per session: Patient refused  . Stress: Only a little  Relationships  . Social Herbalist on phone: Patient refused    Gets together: Patient refused    Attends religious service: Patient refused    Active member of club or organization: Patient refused    Attends meetings of clubs or organizations: Patient refused    Relationship status: Patient refused  . Intimate partner violence    Fear of current or ex partner: No    Emotionally abused: No    Physically abused: No    Forced sexual activity: No  Other Topics Concern  . Not on file  Social History Narrative  . Not on file    FAMILY HISTORY: Family History  Problem Relation Age of Onset  . Hypertension Father   . Diabetes Father   . Alcohol abuse Daughter   . Hypertension Maternal Grandmother   . Diabetes Maternal Grandmother   . Diabetes Paternal Grandmother   . Hypertension Paternal Grandmother     ALLERGIES:  is allergic to ketamine.  MEDICATIONS:  Current Facility-Administered Medications  Medication Dose Route Frequency Provider Last Rate Last Dose  . 0.9 %  sodium chloride infusion   Intravenous Continuous Mayo, Pete Pelt, MD   Stopped at 06/08/19 [phone]  . acetaminophen (TYLENOL) tablet 650 mg  650 mg Oral Q6H PRN Mayo, Pete Pelt, MD       Or  . acetaminophen (TYLENOL) suppository 650 mg  650 mg Rectal Q6H PRN Mayo, Pete Pelt, MD      . albuterol (PROVENTIL) (2.5 MG/3ML) 0.083% nebulizer solution 2.5 mg  2.5 mg Inhalation Q4H PRN Mayo, Pete Pelt, MD      . amitriptyline (ELAVIL) tablet 75 mg  75 mg Oral QHS Sela Hua, MD   75 mg at 06/07/19 2228  . calcium-vitamin D (OSCAL WITH D) 500-200 MG-UNIT per tablet 2 tablet  2 tablet Oral Daily Mayo, Pete Pelt, MD   2 tablet at 06/08/19 0911  . ciprofloxacin (CIPRO) IVPB 400 mg  400 mg Intravenous Q12H Benita Gutter, [facility]   Stopped at 06/08/19 0407  . famotidine  (PEPCID) tablet 20 mg  20 mg Oral BID Mayo, Pete Pelt, MD   20 mg at 06/08/19 0913  . feeding supplement (ENSURE ENLIVE) (ENSURE ENLIVE) liquid 237 mL  237 mL Oral TID BM Mayo, Pete Pelt, MD      . hydrOXYzine (ATARAX/VISTARIL) tablet 10 mg  10 mg Oral TID PRN Mayo, Pete Pelt, MD      . hyoscyamine (LEVBID) 0.375 MG 12 hr tablet 0.375 mg  0.375 mg Oral Q12H Mayo, Pete Pelt, MD   0.375 mg at 06/08/19 0911  . metroNIDAZOLE (FLAGYL) IVPB 500 mg  500 mg Intravenous Q8H Mayo, Pete Pelt,  MD 100 mL/hr at 06/08/19 0517 500 mg at 06/08/19 0517  . multivitamin with minerals tablet 1 tablet  1 tablet Oral Daily Mayo, Pete Pelt, MD   1 tablet at 06/08/19 0911  . ondansetron (ZOFRAN) tablet 4 mg  4 mg Oral Q6H PRN Mayo, Pete Pelt, MD       Or  . ondansetron [facility]) injection 4 mg  4 mg Intravenous Q6H PRN Mayo, Pete Pelt, MD   4 mg at 06/08/19 0916  . oxyCODONE (Oxy IR/ROXICODONE) immediate release tablet 10 mg  10 mg Oral Q6H PRN Mayo, Pete Pelt, MD   10 mg at 06/08/19 0916  . oxyCODONE (OXYCONTIN) 12 hr tablet 15 mg  15 mg Oral Q12H Mayo, Pete Pelt, MD   15 mg at 06/08/19 1025  . pantoprazole (PROTONIX) EC tablet 40 mg  40 mg Oral Daily Mayo, Pete Pelt, MD   40 mg at 06/08/19 0911  . promethazine (PHENERGAN) injection 25 mg  25 mg Intravenous Q6H PRN Mayo, Pete Pelt, MD      . rivaroxaban Alveda Reasons) tablet 20 mg  20 mg Oral Q supper Mayo, Pete Pelt, MD      . sucralfate (CARAFATE) tablet 1 g  1 g Oral QID Mayo, Pete Pelt, MD   1 g at 06/08/19 0911   Facility-Administered Medications Ordered in Other Encounters  Medication Dose Route Frequency Provider Last Rate Last Dose  . heparin lock flush 100 unit/mL  500 Units Intravenous Once Corcoran, Melissa C, MD      . sodium chloride 0.9 % 1,000 mL with potassium chloride 20 mEq infusion   Intravenous Once Honor Loh E, NP      . sodium chloride flush (NS) 0.9 % injection 10 mL  10 mL Intravenous Once Borders, Kirt Boys, NP          .  PHYSICAL  EXAMINATION:  Vitals:   06/08/19 0500 06/08/19 0909  BP: 132/89 (!) 148/94  Pulse: 76 73  Resp: 16 18  Temp: 97.7 F (36.5 C) 98.1 F (36.7 C)  SpO2: 100% 100%   Filed Weights   06/07/19 0947 06/07/19 1540  Weight: 163 lb 2.3 oz (74 kg) 161 lb 2.5 oz (73.1 kg)   Physical Exam  Constitutional: She is oriented to person, place, and time.  Sick appearing Caucasian female patient.  Positive for temporal wasting.  HENT:  Head: Normocephalic and atraumatic.  Mouth/Throat: Oropharynx is clear and moist. No oropharyngeal exudate.  Eyes: Pupils are equal, round, and reactive to light.  Neck: Normal range of motion. Neck supple.  Cardiovascular: Normal rate and regular rhythm.  Pulmonary/Chest: No respiratory distress. She has no wheezes.  Decreased air entry bilaterally.  Abdominal: Soft. Bowel sounds are normal. She exhibits no distension and no mass. There is abdominal tenderness. There is no rebound and no guarding.  Mild tenderness noted diffuse.  No rigidity or guarding.  Musculoskeletal: Normal range of motion.        General: No tenderness or edema.  Neurological: She is alert and oriented to person, place, and time.  Skin: Skin is warm.  Psychiatric: Affect normal.   LABORATORY DATA:  I have reviewed the data as listed Lab Results  Component Value Date   WBC 2.5 (L) 06/08/2019   HGB 11.1 (L) 06/08/2019   HCT 33.8 (L) 06/08/2019   MCV 100.3 (H) 06/08/2019   PLT 152 06/08/2019   Recent Labs    06/11/18 0510  07/08/18 1035  10/21/18 0845  05/29/19  0830 06/05/19 0806 06/07/19 1000 06/08/19 0518  NA 135   < > 136   < > 134*   < > 136 134* 135 136  K 3.9   < > 4.2   < > 3.9   < > 3.9 4.1 3.6 4.0  CL 102   < > 107   < > 98   < > 105 101 100 105  CO2 27   < > 22   < > 24   < > 23 25 22 24   GLUCOSE 82   < > 119*   < > 111*   < > 98 108* 85 69*  BUN 11   < > 15   < > 17   < > 17 15 14 7   CREATININE 0.63   < > 0.71   < > 0.91   < > 0.68 0.77 0.78 0.72  CALCIUM 8.9   <  > 8.7*   < > 9.6   < > 9.0 9.3 9.8 9.0  GFRNONAA >60   < > >60   < > >60   < > >60 >60 >60 >60  GFRAA >60   < > >60   < > >60   < > >60 >60 >60 >60  PROT 7.0   < > 6.5   < > 8.6*   < > 7.1 8.0 8.1  --   ALBUMIN 3.5   < > 3.4*   < > 4.4   < > 3.4* 3.7 3.9  --   AST 41   < > 26   < > 48*   < > 36 51* 48*  --   ALT 51*   < > 33   < > 67*   < > 54* 69* 67*  --   ALKPHOS 171*   < > 132*   < > 185*   < > 178* 177* 170*  --   BILITOT 0.7   < > 0.4   < > 1.4*   < > 0.4 0.5 0.8  --   BILIDIR 0.1  --  <0.1  --  0.3*  --   --   --   --   --   IBILI 0.6  --  NOT CALCULATED  --   --   --   --   --   --   --    < > = values in this interval not displayed.    RADIOGRAPHIC STUDIES: I have personally reviewed the radiological images as listed and agreed with the findings in the report. Ct Head Wo Contrast  Result Date: 06/07/2019 CLINICAL DATA:  Headache, history of metastatic cervical cancer EXAM: CT HEAD WITHOUT CONTRAST TECHNIQUE: Contiguous axial images were obtained from the base of the skull through the vertex without intravenous contrast. COMPARISON:  05/18/2019 FINDINGS: Brain: No evidence of acute infarction, hemorrhage, hydrocephalus, extra-axial collection or mass lesion/mass effect. Vascular: No hyperdense vessel or unexpected calcification. Skull: Normal. Negative for fracture or focal lesion. Sinuses/Orbits: The visualized paranasal sinuses are essentially clear. The mastoid air cells are unopacified. Other: None. IMPRESSION: Normal head CT. Electronically Signed   By: Julian Hy M.D.   On: 06/07/2019 11:04   Ct Head W Or Wo Contrast  Result Date: 05/18/2019 CLINICAL DATA:  53 year old female with history of metastatic cervical cancer. Severe pain radiating from the skull base through the neck and left arm. EXAM: CT HEAD WITHOUT AND WITH CONTRAST CT NECK WITH CONTRAST TECHNIQUE: Contiguous  axial images were obtained from the base of the skull through the vertex without and with intravenous  contrast. Multidetector CT imaging of the and neck was performed using the standard protocol following the bolus administration of intravenous contrast. CONTRAST:  50m OMNIPAQUE IOHEXOL 300 MG/ML  SOLN COMPARISON:  Head CT without contrast 01/26/2019. Chest CT today reported separately. FINDINGS: CT HEAD FINDINGS Brain: Stable and normal cerebral volume. No midline shift, ventriculomegaly, mass effect, evidence of mass lesion, intracranial hemorrhage or evidence of cortically based acute infarction. Gray-white matter differentiation is within normal limits throughout the brain. No abnormal enhancement identified. Vascular: The major intracranial vascular structures are enhancing as expected. Skull: Stable, negative. Sinuses/Orbits: Visualized paranasal sinuses and mastoids are stable and well pneumatized. Other: Visualized orbits and scalp soft tissues are within normal limits. CT NECK FINDINGS Pharynx and larynx: Laryngeal and pharyngeal soft tissue contours are within normal limits. Negative parapharyngeal and retropharyngeal spaces. Salivary glands: Negative sublingual space. Submandibular and parotid glands appear symmetric and normal. Thyroid: Negative. Lymph nodes: Negative.  No cervical lymphadenopathy. Vascular: Right IJ approach porta cath. The major vascular structures in the neck and at the skull base are patent. Mastoids and visualized paranasal sinuses: Clear. Skeleton: Absent dentition. Widespread advanced cervical disc and endplate degeneration. Upper cervical facet degeneration with mild degenerative appearing anterolisthesis of C3 on C4. Degenerative appearing bone mineralization heterogeneity in the odontoid. No acute or suspicious osseous lesion identified at the skull base or neck. Upper chest: Reported separately today. IMPRESSION: 1. No metastatic disease identified in the head or neck. 2.  Normal CT appearance of the brain. 3. Negative neck CT aside from widespread cervical spine  degeneration. 4.  CT Chest today reported separately. Electronically Signed   By: HGenevie AnnM.D.   On: 05/18/2019 14:42   Ct Soft Tissue Neck W Contrast  Result Date: 05/18/2019 CLINICAL DATA:  53year old female with history of metastatic cervical cancer. Severe pain radiating from the skull base through the neck and left arm. EXAM: CT HEAD WITHOUT AND WITH CONTRAST CT NECK WITH CONTRAST TECHNIQUE: Contiguous axial images were obtained from the base of the skull through the vertex without and with intravenous contrast. Multidetector CT imaging of the and neck was performed using the standard protocol following the bolus administration of intravenous contrast. CONTRAST:  756mOMNIPAQUE IOHEXOL 300 MG/ML  SOLN COMPARISON:  Head CT without contrast 01/26/2019. Chest CT today reported separately. FINDINGS: CT HEAD FINDINGS Brain: Stable and normal cerebral volume. No midline shift, ventriculomegaly, mass effect, evidence of mass lesion, intracranial hemorrhage or evidence of cortically based acute infarction. Gray-white matter differentiation is within normal limits throughout the brain. No abnormal enhancement identified. Vascular: The major intracranial vascular structures are enhancing as expected. Skull: Stable, negative. Sinuses/Orbits: Visualized paranasal sinuses and mastoids are stable and well pneumatized. Other: Visualized orbits and scalp soft tissues are within normal limits. CT NECK FINDINGS Pharynx and larynx: Laryngeal and pharyngeal soft tissue contours are within normal limits. Negative parapharyngeal and retropharyngeal spaces. Salivary glands: Negative sublingual space. Submandibular and parotid glands appear symmetric and normal. Thyroid: Negative. Lymph nodes: Negative.  No cervical lymphadenopathy. Vascular: Right IJ approach porta cath. The major vascular structures in the neck and at the skull base are patent. Mastoids and visualized paranasal sinuses: Clear. Skeleton: Absent dentition.  Widespread advanced cervical disc and endplate degeneration. Upper cervical facet degeneration with mild degenerative appearing anterolisthesis of C3 on C4. Degenerative appearing bone mineralization heterogeneity in the odontoid. No acute or suspicious osseous lesion  identified at the skull base or neck. Upper chest: Reported separately today. IMPRESSION: 1. No metastatic disease identified in the head or neck. 2.  Normal CT appearance of the brain. 3. Negative neck CT aside from widespread cervical spine degeneration. 4.  CT Chest today reported separately. Electronically Signed   By: Genevie Ann M.D.   On: 05/18/2019 14:42   Ct Chest W Contrast  Result Date: 05/18/2019 CLINICAL DATA:  Hx of cervical cancer with mets to the lungs. Severe pain from base of skull to chest and left arm. Concern for further mets. EXAM: CT CHEST WITH CONTRAST TECHNIQUE: Multidetector CT imaging of the chest was performed during intravenous contrast administration. CONTRAST:  49m OMNIPAQUE IOHEXOL 300 MG/ML  SOLN COMPARISON:  04/21/2019 FINDINGS: Cardiovascular: Heart is normal in size and configuration. No pericardial effusion. Great vessels are normal in caliber. Minor aortic atherosclerosis. No dissection. Arch branch vessels are widely patent. Mediastinum/Nodes: Visualized thyroid gland is unremarkable. No neck base or axillary masses or enlarged lymph nodes. No mediastinal or hilar masses or adenopathy. Trachea and esophagus are unremarkable. Right internal jugular Port-A-Cath tip projects in the right atrium. Lungs/Pleura: Numerous bilateral metastatic pulmonary nodules with an increase in number and size compared to the prior CT. Largest nodule lies at the base of the right lower lobe, image 37, series 9, 14 mm, previously 12 mm. Nodule in the posteromedial base of the left lower lobe, image 44, measures 10 mm, previously 7 mm. Multiple nodules shows central cavitation. No evidence of pneumonia or pulmonary edema. No pleural  effusion or pneumothorax. Upper Abdomen: No acute abnormality. Musculoskeletal: No fracture or acute finding. No osteoblastic or osteolytic lesions. IMPRESSION: 1. No acute findings in the chest. 2. Progression of metastatic disease to the lungs with an increase in the size and number of metastatic lung nodules. No other evidence of metastatic disease in the chest. Aortic Atherosclerosis (ICD10-I70.0). Electronically Signed   By: DLajean ManesM.D.   On: 05/18/2019 14:47   Ct Angio Chest Pe W And/or Wo Contrast  Result Date: 05/23/2019 CLINICAL DATA:  Chest pain.  Metastatic cervical carcinoma EXAM: CT ANGIOGRAPHY CHEST WITH CONTRAST TECHNIQUE: Multidetector CT imaging of the chest was performed using the standard protocol during bolus administration of intravenous contrast. Multiplanar CT image reconstructions and MIPs were obtained to evaluate the vascular anatomy. CONTRAST:  725mOMNIPAQUE IOHEXOL 350 MG/ML SOLN COMPARISON:  Chest CT May 18, 2019 FINDINGS: Cardiovascular: There is no demonstrable pulmonary embolus. There is no thoracic aortic aneurysm or dissection. There is calcification at the origin of the right innominate artery. Other visualized great vessels appear unremarkable. There are scattered foci of aortic atherosclerosis. There is no pericardial effusion or pericardial thickening evident. Port-A-Cath tip near cavoatrial junction. Mediastinum/Nodes: Visualized thyroid appears unremarkable. There is no appreciable thoracic adenopathy. No esophageal lesions evident. Lungs/Pleura: There are nodular metastases throughout the lungs bilaterally, several of which are cavitated, stable. Nodular opacities range in size from as small as 2 mm to as large as 1.6 cm. There is no edema or consolidation. No pleural effusions. Upper Abdomen: Gallbladder absent. Upper abdominal structures otherwise appear unremarkable. Musculoskeletal: No blastic or lytic bone lesions. No chest wall lesions evident. Note that  the port is present anteriorly on the right, superficial to the pectoralis muscles. Review of the MIP images confirms the above findings. IMPRESSION: 1. No demonstrable pulmonary embolus. No thoracic aortic aneurysm or dissection. Foci of aortic atherosclerosis noted. 2. Nodular lesions throughout the lungs consistent with metastatic disease.  Multiple parenchymal lung metastases show cavitation. Largest metastatic focus measures 1.6 cm in size. 3.  No evident adenopathy. 4.  Gallbladder absent. Aortic Atherosclerosis (ICD10-I70.0). Electronically Signed   By: Lowella Grip III M.D.   On: 05/23/2019 14:07   Mr Cervical Spine Wo Contrast  Result Date: 05/18/2019 CLINICAL DATA:  LEFT neck and shoulder pain. Pain is severe, for 3 days. Unable to lift LEFT arm. EXAM: MRI CERVICAL SPINE WITHOUT CONTRAST TECHNIQUE: Multiplanar, multisequence MR imaging of the cervical spine was performed. No intravenous contrast was administered. COMPARISON:  CT of the neck earlier today. FINDINGS: The patient was unable to remain motionless for the exam. Small or subtle lesions could be overlooked. Alignment: Straightening, and slight reversal of the normal cervical lordosis. Trace anterolisthesis C2-3, C3-4, and C4-5 are facet mediated. Vertebrae: Endplate reactive changes, C3 through C7. No visible metastatic disease. Cord: Limited assessment, no definite impingement or abnormal signal. Posterior Fossa, vertebral arteries, paraspinal tissues: No tonsillar herniation. Vertebral flow voids are maintained. No neck masses. Disc levels: C2-3: Mild degenerative anterolisthesis. Annular bulge. No impingement. C3-4: Disc space narrowing. 2 mm degenerative anterolisthesis. Bulky RIGHT facet arthrosis. BILATERAL RIGHT greater than LEFT C4 foraminal narrowing. C4-5: Disc space narrowing with osseous spurring. 1-2 mm anterolisthesis. Bulky RIGHT facet arthropathy. RIGHT greater than LEFT C5 foraminal narrowing. C5-6: Disc space narrowing  with osseous spurring. Mild stenosis. Shallow central protrusion. BILATERAL C6 foraminal narrowing due to uncinate spurring. C6-7: Disc space narrowing. Osseous spurring. Shallow central protrusion. Effacement anterior subarachnoid space. Borderline BILATERAL C7 foraminal narrowing. C7-T1: Unremarkable. IMPRESSION: Multilevel spondylosis as described. BILATERAL foraminal narrowing at C5-6 and C6-7 could affect the RIGHT or LEFT exiting nerve roots at those levels, but there is no clearly asymmetric or predominant LEFT-sided compressive lesion. Within limits for detection on this motion degraded exam, no metastatic disease, epidural hematoma, significant spinal stenosis, or dominant/lateralizing disc protrusion. Electronically Signed   By: Staci Righter M.D.   On: 05/18/2019 21:25   Ct Abdomen Pelvis W Contrast  Result Date: 06/07/2019 CLINICAL DATA:  Metastatic cervical cancer, nausea, vomiting and diarrhea EXAM: CT ABDOMEN AND PELVIS WITH CONTRAST TECHNIQUE: Multidetector CT imaging of the abdomen and pelvis was performed using the standard protocol following bolus administration of intravenous contrast. CONTRAST:  169m OMNIPAQUE IOHEXOL 300 MG/ML  SOLN COMPARISON:  04/21/2019 FINDINGS: Lower chest: Continue progression of lower chest numerous bilateral pulmonary metastases. These have increased in size and number. Some are cavitary. No pleural effusion. No superimposed lower lobe consolidation or collapse. Normal heart size. No pericardial effusion. Hepatobiliary: Stable intra and extrahepatic biliary dilatation following cholecystectomy. No focal hepatic abnormality. Pancreas: Unremarkable. No pancreatic ductal dilatation or surrounding inflammatory changes. Spleen: Normal in size without focal abnormality. Adrenals/Urinary Tract: Adrenal glands are unremarkable. Kidneys are normal, focal lesion, or hydronephrosis. Bladder is unremarkable. Punctate nonobstructing subcentimeter bilateral intrarenal calculi.  Stomach/Bowel: Negative for bowel obstruction, significant dilatation, ileus, or free air. Patient appears status post right colectomy. Distal colon demonstrates mild wall thickening of the descending colon, sigmoid rectum mucosal enhancement, similar to the prior study which may represent colitis. This could be related to prior pelvic radiation. No fluid collection, hemorrhage, hematoma, abscess, or ascites. Vascular/Lymphatic: Abdominal atherosclerosis without aneurysm occlusive process. No veno-occlusive disease. Mesenteric and renal vasculature appear patent. No bulky adenopathy. Reproductive: Atrophic uterus. No adnexal abnormality. No pelvic free fluid, fluid collection, or abscess. Other: Intact abdominal wall.  No hernia. Musculoskeletal: Osteopenia and degenerative change with associated scoliosis. No acute osseous finding. IMPRESSION: Continue progression  of pulmonary metastases. Stable biliary dilatation following cholecystectomy Similar distal colonic wall thickening and mucosal enhancement predominately of the sigmoid and rectum compatible with colitis, possibly secondary to radiation. Abdominal atherosclerosis without aneurysm Negative for bowel obstruction, free, or abscess. Nonobstructing nephrolithiasis. Electronically Signed   By: Jerilynn Mages.  Shick M.D.   On: 06/07/2019 12:32   US Venous Img Upper Uni Left  Result Date: 05/18/2019 CLINICAL DATA:  Pain and swelling for 4 days. EXAM: LEFT UPPER EXTREMITY VENOUS DOPPLER ULTRASOUND TECHNIQUE: Gray-scale sonography with graded compression, as well as color Doppler and duplex ultrasound were performed to evaluate the upper extremity deep venous system from the level of the subclavian vein and including the jugular, axillary, basilic, radial, ulnar and upper cephalic vein. Spectral Doppler was utilized to evaluate flow at rest and with distal augmentation maneuvers. COMPARISON:  None. FINDINGS: Contralateral Subclavian Vein: Respiratory phasicity is normal and  symmetric with the symptomatic side. No evidence of thrombus. Normal compressibility. Internal Jugular Vein: No evidence of thrombus. Normal compressibility, respiratory phasicity and response to augmentation. Subclavian Vein: No evidence of thrombus. Normal compressibility, respiratory phasicity and response to augmentation. Axillary Vein: No evidence of thrombus. Normal compressibility, respiratory phasicity and response to augmentation. Cephalic Vein: No evidence of thrombus. Normal compressibility, respiratory phasicity and response to augmentation. Basilic Vein: No evidence of thrombus. Normal compressibility, respiratory phasicity and response to augmentation. Brachial Veins: No evidence of thrombus. Normal compressibility, respiratory phasicity and response to augmentation. Radial Veins: No evidence of thrombus. Normal compressibility, respiratory phasicity and response to augmentation. Ulnar Veins: No evidence of thrombus. Normal compressibility, respiratory phasicity and response to augmentation. Venous Reflux:  None visualized. Other Findings:  None visualized. IMPRESSION: No evidence of DVT within the left upper extremity. Electronically Signed   By: Lovey Newcomer M.D.   On: 05/18/2019 10:40   Dg Shoulder Left  Result Date: 05/19/2019 CLINICAL DATA:  Left shoulder pain.  No known injury. EXAM: LEFT SHOULDER - 2+ VIEW COMPARISON:  Chest CT 05/18/2019 FINDINGS: No fracture or dislocation is identified. No obvious bone lesions. Small calcifications are noted near the acromion which could be unfused ossification centers or insertional deltoid ligament calcific tendinopathy. Diffuse pulmonary metastatic disease is noted. IMPRESSION: No acute bony findings or obvious metastatic bone lesions. Numerous pulmonary metastatic lesions. Electronically Signed   By: Marijo Sanes M.D.   On: 05/19/2019 09:41   Dg Hip Unilat W Or Wo Pelvis 2-3 Views Right  Result Date: 06/07/2019 CLINICAL DATA:  Right hip pain for 1  week. No reported injury. History of pseudogout. EXAM: DG HIP (WITH OR WITHOUT PELVIS) 2-3V RIGHT COMPARISON:  04/21/2027 CT abdomen/pelvis. FINDINGS: There is no evidence of hip fracture or dislocation. There is no evidence of significant arthropathy or other focal bone abnormality. Degenerative changes in the visualized lower lumbar spine. IMPRESSION: No acute osseous abnormality. Electronically Signed   By: Ilona Sorrel M.D.   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  It is unclear to at this time if patient's nausea vomiting diarrhea-is related to [facility] versus other causes. [See discussion below]  #Intractable nausea vomiting diarrhea-patient has  chronic diarrhea ?  Short bowel from previous radiation.  However awaiting ruling out infection causes [C. Difficile/GI PCR panel pending].  It is unclear if patient's diarrhea is related to immunotherapy.  However patient has not had any loose stools since admission to the hospital.  Hold off any use of steroids at this time.  Check cortisol level [patient has a history of adrenal insufficiency].   #Recent left effusion/inflammatory arthritis-status post aspiration negative for infection.  She is status post steroid injection.  #Peripheral neuropathy-stable  #Hypomagnesemia-secondary platinum chemotherapy/poor p.o. intake diarrhea-supplement magnesium.  #History of DVT/PE-stable Xarelto  Thank you Dr.Kalisetti for allowing me to participate in the care of your pleasant patient.  Please do not hesitate to contact me with questions or concerns in the interim.  The above plan of care was discussed the patient at length.  She agrees.   All questions were answered. The patient knows to call the clinic with any problems, questions or concerns.    Cammie Sickle, MD 06/08/2019 10:06 AM

## 2019-06-08 NOTE — Assessment & Plan Note (Addendum)
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 August 22 CT scan abdomen pelvis-increasing size of the lung nodules; otherwise no obvious disease noted in the abdomen pelvis.  Patient current symptoms are likely secondary to immunotherapy/see below  # Intractable nausea vomiting diarrhea-[patient has chronic diarrhea ?  Short bowel from previous radiation]-likely secondary to adrenal crisis-a.m. cortisol 0.6.  Awaiting aldosterone levels.  Currently on hydrocortisone 50 mg IV every 6.  Symptoms improved not completely resolved.  I would recommend 1 more day of IV steroids; and then switching over to oral steroids at discharge.   #History of DVT/PE-stable Xarelto  #Discussed with patient she is in agreement.  Discussed Dr. Tressia Miners.

## 2019-06-09 DIAGNOSIS — C538 Malignant neoplasm of overlapping sites of cervix uteri: Secondary | ICD-10-CM

## 2019-06-09 LAB — URINE DRUG SCREEN, QUALITATIVE (ARMC ONLY)
Amphetamines, Ur Screen: NOT DETECTED
Barbiturates, Ur Screen: NOT DETECTED
Benzodiazepine, Ur Scrn: NOT DETECTED
Cannabinoid 50 Ng, Ur ~~LOC~~: POSITIVE — AB
Cocaine Metabolite,Ur ~~LOC~~: NOT DETECTED
MDMA (Ecstasy)Ur Screen: NOT DETECTED
Methadone Scn, Ur: NOT DETECTED
Opiate, Ur Screen: NOT DETECTED
Phencyclidine (PCP) Ur S: NOT DETECTED
Tricyclic, Ur Screen: POSITIVE — AB

## 2019-06-09 LAB — BASIC METABOLIC PANEL
Anion gap: 8 (ref 5–15)
BUN: 7 mg/dL (ref 6–20)
CO2: 26 mmol/L (ref 22–32)
Calcium: 9 mg/dL (ref 8.9–10.3)
Chloride: 103 mmol/L (ref 98–111)
Creatinine, Ser: 0.83 mg/dL (ref 0.44–1.00)
GFR calc Af Amer: >60 mL/min (ref >60)
GFR calc non Af Amer: >60 mL/min (ref >60)
Glucose, Bld: 87 mg/dL (ref 70–99)
Potassium: 3.9 mmol/L (ref 3.5–5.1)
Sodium: 137 mmol/L (ref 135–145)

## 2019-06-09 LAB — CORTISOL: Cortisol, Plasma: 0.8 ug/dL

## 2019-06-09 MED ORDER — ONDANSETRON HCL 4 MG PO TABS: 4.0000 mg | ORAL_TABLET | ORAL | Status: DC | PRN

## 2019-06-09 MED ORDER — HYDROCORTISONE NA SUCCINATE PF 100 MG IJ SOLR
50.0000 mg | Freq: Four times a day (QID) | INTRAMUSCULAR | Status: DC
  Administered 2019-06-09 – 2019-06-10 (×5): 50 mg via INTRAVENOUS
  Filled 2019-06-09 (×6): qty 1

## 2019-06-09 MED ORDER — DICYCLOMINE HCL 20 MG PO TABS
20.0000 mg | ORAL_TABLET | Freq: Three times a day (TID) | ORAL | Status: DC
  Administered 2019-06-09 – 2019-06-10 (×5): 20 mg via ORAL
  Filled 2019-06-09 (×7): qty 1

## 2019-06-09 MED ORDER — ONDANSETRON HCL 4 MG/2ML IJ SOLN
4.0000 mg | INTRAMUSCULAR | Status: DC | PRN
  Administered 2019-06-09: 4 mg via INTRAVENOUS
  Filled 2019-06-09 (×2): qty 2

## 2019-06-09 NOTE — Progress Notes (Signed)
Joanna Hall   DOB:09/08/1966   YP#:[id number]    Subjective: " Had a rough night". Patient states to have diarrhea one episode lasted.  Again this morning.  Notes to have nausea with vomiting overnight.  Continues abdominal pain.  Patient feels she is not any better since admission.  Objective:  Vitals:   06/08/19 2015 06/09/19 0352  BP: 120/85 (!) 141/91  Pulse: 74 95  Resp: 18   Temp: 97.8 F (36.6 C) 97.7 F (36.5 C)  SpO2: 100% 100%     Intake/Output Summary (Last 24 hours) at 06/09/2019 0837 Last data filed at 06/09/2019 0513 Gross per 24 hour  Intake 50 ml  Output 100 ml  Net -50 ml    Physical Exam  Constitutional: She is oriented to person, place, and time.  Patient appears sick.  She is actively retching/nauseous.   HENT:  Head: Normocephalic and atraumatic.  Mouth/Throat: Oropharynx is clear and moist. No oropharyngeal exudate.  Eyes: Pupils are equal, round, and reactive to light.  Neck: Normal range of motion. Neck supple.  Cardiovascular: Normal rate and regular rhythm.  Pulmonary/Chest: Effort normal and breath sounds normal. No respiratory distress. She has no wheezes.  Abdominal: Soft. Bowel sounds are normal. She exhibits no distension and no mass. There is abdominal tenderness. There is no rebound and no guarding.  Mild tenderness noted.  No rigidity or guarding.  Musculoskeletal: Normal range of motion.        General: No tenderness or edema.  Neurological: She is alert and oriented to person, place, and time.  Skin: Skin is warm.  Psychiatric: Affect normal.     Labs:  Lab Results  Component Value Date   WBC 2.5 (L) 06/08/2019   HGB 11.1 (L) 06/08/2019   HCT 33.8 (L) 06/08/2019   MCV 100.3 (H) 06/08/2019   PLT 152 06/08/2019   NEUTROABS 3.4 06/07/2019    Lab Results  Component Value Date   NA 137 06/09/2019   K 3.9 06/09/2019   CL 103 06/09/2019   CO2 26 06/09/2019    Studies:  Ct Head Wo Contrast  Result Date:  06/07/2019 CLINICAL DATA:  Headache, history of metastatic cervical cancer EXAM: CT HEAD WITHOUT CONTRAST TECHNIQUE: Contiguous axial images were obtained from the base of the skull through the vertex without intravenous contrast. COMPARISON:  05/18/2019 FINDINGS: Brain: No evidence of acute infarction, hemorrhage, hydrocephalus, extra-axial collection or mass lesion/mass effect. Vascular: No hyperdense vessel or unexpected calcification. Skull: Normal. Negative for fracture or focal lesion. Sinuses/Orbits: The visualized paranasal sinuses are essentially clear. The mastoid air cells are unopacified. Other: None. IMPRESSION: Normal head CT. Electronically Signed   By: Julian Hy M.D.   On: 06/07/2019 11:04   Ct Abdomen Pelvis W Contrast  Result Date: 06/07/2019 CLINICAL DATA:  Metastatic cervical cancer, nausea, vomiting and diarrhea EXAM: CT ABDOMEN AND PELVIS WITH CONTRAST TECHNIQUE: Multidetector CT imaging of the abdomen and pelvis was performed using the standard protocol following bolus administration of intravenous contrast. CONTRAST:  181m OMNIPAQUE IOHEXOL 300 MG/ML  SOLN COMPARISON:  04/21/2019 FINDINGS: Lower chest: Continue progression of lower chest numerous bilateral pulmonary metastases. These have increased in size and number. Some are cavitary. No pleural effusion. No superimposed lower lobe consolidation or collapse. Normal heart size. No pericardial effusion. Hepatobiliary: Stable intra and extrahepatic biliary dilatation following cholecystectomy. No focal hepatic abnormality. Pancreas: Unremarkable. No pancreatic ductal dilatation or surrounding inflammatory changes. Spleen: Normal in size without focal abnormality. Adrenals/Urinary Tract: Adrenal glands are  unremarkable. Kidneys are normal, focal lesion, or hydronephrosis. Bladder is unremarkable. Punctate nonobstructing subcentimeter bilateral intrarenal calculi. Stomach/Bowel: Negative for bowel obstruction, significant  dilatation, ileus, or free air. Patient appears status post right colectomy. Distal colon demonstrates mild wall thickening of the descending colon, sigmoid rectum mucosal enhancement, similar to the prior study which may represent colitis. This could be related to prior pelvic radiation. No fluid collection, hemorrhage, hematoma, abscess, or ascites. Vascular/Lymphatic: Abdominal atherosclerosis without aneurysm occlusive process. No veno-occlusive disease. Mesenteric and renal vasculature appear patent. No bulky adenopathy. Reproductive: Atrophic uterus. No adnexal abnormality. No pelvic free fluid, fluid collection, or abscess. Other: Intact abdominal wall.  No hernia. Musculoskeletal: Osteopenia and degenerative change with associated scoliosis. No acute osseous finding. IMPRESSION: Continue progression of pulmonary metastases. Stable biliary dilatation following cholecystectomy Similar distal colonic wall thickening and mucosal enhancement predominately of the sigmoid and rectum compatible with colitis, possibly secondary to radiation. Abdominal atherosclerosis without aneurysm Negative for bowel obstruction, free, or abscess. Nonobstructing nephrolithiasis. Electronically Signed   By: Jerilynn Mages.  Shick M.D.   On: 06/07/2019 12:32   Dg Hip Unilat W Or Wo Pelvis 2-3 Views Right  Result Date: 06/07/2019 CLINICAL DATA:  Right hip pain for 1 week. No reported injury. History of pseudogout. EXAM: DG HIP (WITH OR WITHOUT PELVIS) 2-3V RIGHT COMPARISON:  04/21/2027 CT abdomen/pelvis. FINDINGS: There is no evidence of hip fracture or dislocation. There is no evidence of significant arthropathy or other focal bone abnormality. Degenerative changes in the visualized lower lumbar spine. IMPRESSION: No acute osseous abnormality. Electronically Signed   By: Ilona Sorrel M.D.   On: 06/07/2019 11:58    Malignant neoplasm of overlapping sites of cervix [name]) #53 year old female patient with a history of Cervical adenocarcinoma  with metastasis to the lung currently on immunotherapy is current admitted hospital for intractable nausea vomiting diarrhea worsening abdominal discomfort  #Cervical adenocarcinoma-on Keytruda status post cycle #2 approximate on July 13th. August 22 CT scan abdomen pelvis-increasing size of the lung nodules; otherwise no obvious disease noted in the abdomen pelvis.  It is unclear to at this time if patient's nausea vomiting diarrhea-is related to [facility] versus other causes. [See discussion below]  #Intractable nausea vomiting diarrhea-[patient has chronic diarrhea ?  Short bowel from previous radiation]-currently worse.  C. Difficile/GI PCR negative for infection.  Patient has history of adrenal insufficiency; patient states to be on steroids at home.  Currently not on steroids in the hospital.  Awaiting cortisol level the same.  Recommend starting steroids-that would treat potential adrenal insufficiency/possible immunotherapy related side effects.  #History of DVT/PE-stable Xarelto  #Discussed with patient she is in agreement.   Cammie Sickle, MD 06/09/2019  8:37 AM

## 2019-06-09 NOTE — Progress Notes (Signed)
[facility] NAME: Joanna Hall    MR#:  [id number]  DATE OF BIRTH:  Feb 20, 1966  SUBJECTIVE:  CHIEF COMPLAINT:   Chief Complaint  Patient presents with   Emesis   Diarrhea   -Patient feels miserable this morning. -Complains of significant abdominal pain, very tearful.  Also nauseous and vomiting last evening  REVIEW OF SYSTEMS:  Review of Systems  Constitutional: Positive for malaise/fatigue. Negative for chills and fever.  HENT: Negative for congestion, ear discharge, hearing loss and nosebleeds.   Eyes: Negative for blurred vision and double vision.  Respiratory: Negative for cough, shortness of breath and wheezing.   Cardiovascular: Negative for chest pain and palpitations.  Gastrointestinal: Positive for abdominal pain, nausea and vomiting. Negative for constipation and diarrhea.  Genitourinary: Negative for dysuria.  Musculoskeletal: Negative for myalgias.  Neurological: Negative for dizziness, focal weakness, seizures, weakness and headaches.  Psychiatric/Behavioral: Negative for depression.    DRUG ALLERGIES:   Allergies  Allergen Reactions   Ketamine Anxiety and Other (See Comments)    Syncope episode/confusion     VITALS:  Blood pressure (!) 136/99, pulse 72, temperature 97.9 F (36.6 C), resp. rate 20, height 5' 8"  (1.727 m), weight 73.1 kg, SpO2 100 %.  PHYSICAL EXAMINATION:  Physical Exam   GENERAL:  53 y.o.-year-old patient lying in the bed with no acute distress.  EYES: Pupils equal, round, reactive to light and accommodation. No scleral icterus. Extraocular muscles intact.  HEENT: Head atraumatic, normocephalic. Oropharynx and nasopharynx clear.  NECK:  Supple, no jugular venous distention. No thyroid enlargement, no tenderness.  LUNGS: Normal breath sounds bilaterally, no wheezing, rales,rhonchi or crepitation. No use of accessory muscles of respiration. Decreased bibasilar breath  sounds CARDIOVASCULAR: S1, S2 normal. No murmurs, rubs, or gallops.  ABDOMEN: Soft, generalized tenderness all over the abdomen- no guarding or rigidity, , nondistended. Bowel sounds present. No organomegaly or mass.  EXTREMITIES: No pedal edema, cyanosis, or clubbing.  NEUROLOGIC: Cranial nerves II through XII are intact. Muscle strength 5/5 in all extremities. Sensation intact. Gait not checked.  PSYCHIATRIC: The patient is alert and oriented x 3.  SKIN: No obvious rash, lesion, or ulcer.    LABORATORY PANEL:   CBC Recent Labs  Lab 06/08/19 0518  WBC 2.5*  HGB 11.1*  HCT 33.8*  PLT 152   ------------------------------------------------------------------------------------------------------------------  Chemistries  Recent Labs  Lab 06/07/19 1000 06/08/19 0518 06/09/19 0400  NA 135 136 137  K 3.6 4.0 3.9  CL 100 105 103  CO2 22 24 26   GLUCOSE 85 69* 87  BUN 14 7 7   CREATININE 0.78 0.72 0.83  CALCIUM 9.8 9.0 9.0  MG 1.4* 1.6*  --   AST 48*  --   --   ALT 67*  --   --   ALKPHOS 170*  --   --   BILITOT 0.8  --   --    ------------------------------------------------------------------------------------------------------------------  Cardiac Enzymes No results for input(s): TROPONINI in the last 168 hours. ------------------------------------------------------------------------------------------------------------------  RADIOLOGY:  Ct Head Wo Contrast  Result Date: 06/07/2019 CLINICAL DATA:  Headache, history of metastatic cervical cancer EXAM: CT HEAD WITHOUT CONTRAST TECHNIQUE: Contiguous axial images were obtained from the base of the skull through the vertex without intravenous contrast. COMPARISON:  05/18/2019 FINDINGS: Brain: No evidence of acute infarction, hemorrhage, hydrocephalus, extra-axial collection or mass lesion/mass effect. Vascular: No hyperdense vessel or unexpected calcification. Skull: Normal. Negative for fracture or focal lesion. Sinuses/Orbits:  The  visualized paranasal sinuses are essentially clear. The mastoid air cells are unopacified. Other: None. IMPRESSION: Normal head CT. Electronically Signed   By: Julian Hy M.D.   On: 06/07/2019 11:04   Ct Abdomen Pelvis W Contrast  Result Date: 06/07/2019 CLINICAL DATA:  Metastatic cervical cancer, nausea, vomiting and diarrhea EXAM: CT ABDOMEN AND PELVIS WITH CONTRAST TECHNIQUE: Multidetector CT imaging of the abdomen and pelvis was performed using the standard protocol following bolus administration of intravenous contrast. CONTRAST:  182m OMNIPAQUE IOHEXOL 300 MG/ML  SOLN COMPARISON:  04/21/2019 FINDINGS: Lower chest: Continue progression of lower chest numerous bilateral pulmonary metastases. These have increased in size and number. Some are cavitary. No pleural effusion. No superimposed lower lobe consolidation or collapse. Normal heart size. No pericardial effusion. Hepatobiliary: Stable intra and extrahepatic biliary dilatation following cholecystectomy. No focal hepatic abnormality. Pancreas: Unremarkable. No pancreatic ductal dilatation or surrounding inflammatory changes. Spleen: Normal in size without focal abnormality. Adrenals/Urinary Tract: Adrenal glands are unremarkable. Kidneys are normal, focal lesion, or hydronephrosis. Bladder is unremarkable. Punctate nonobstructing subcentimeter bilateral intrarenal calculi. Stomach/Bowel: Negative for bowel obstruction, significant dilatation, ileus, or free air. Patient appears status post right colectomy. Distal colon demonstrates mild wall thickening of the descending colon, sigmoid rectum mucosal enhancement, similar to the prior study which may represent colitis. This could be related to prior pelvic radiation. No fluid collection, hemorrhage, hematoma, abscess, or ascites. Vascular/Lymphatic: Abdominal atherosclerosis without aneurysm occlusive process. No veno-occlusive disease. Mesenteric and renal vasculature appear patent. No bulky  adenopathy. Reproductive: Atrophic uterus. No adnexal abnormality. No pelvic free fluid, fluid collection, or abscess. Other: Intact abdominal wall.  No hernia. Musculoskeletal: Osteopenia and degenerative change with associated scoliosis. No acute osseous finding. IMPRESSION: Continue progression of pulmonary metastases. Stable biliary dilatation following cholecystectomy Similar distal colonic wall thickening and mucosal enhancement predominately of the sigmoid and rectum compatible with colitis, possibly secondary to radiation. Abdominal atherosclerosis without aneurysm Negative for bowel obstruction, free, or abscess. Nonobstructing nephrolithiasis. Electronically Signed   By: MJerilynn Mages  Shick M.D.   On: 06/07/2019 12:32   Dg Hip Unilat W Or Wo Pelvis 2-3 Views Right  Result Date: 06/07/2019 CLINICAL DATA:  Right hip pain for 1 week. No reported injury. History of pseudogout. EXAM: DG HIP (WITH OR WITHOUT PELVIS) 2-3V RIGHT COMPARISON:  04/21/2027 CT abdomen/pelvis. FINDINGS: There is no evidence of hip fracture or dislocation. There is no evidence of significant arthropathy or other focal bone abnormality. Degenerative changes in the visualized lower lumbar spine. IMPRESSION: No acute osseous abnormality. Electronically Signed   By: JIlona SorrelM.D.   On: 06/07/2019 11:58    EKG:   Orders placed or performed in visit on 06/07/19   EKG 12-Lead   EKG 12-Lead   EKG 12-Lead   *Note: Due to a large number of results and/or encounters for the requested time period, some results have not been displayed. A complete set of results can be found in Results Review.    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  1. Abdominal pain with nausea and vomiting- ? Acute  gastroenetritis -Worsened again last night.  Unsure what the cause is.  CT showing some chronic colitis changes in distal colon-likely radiation colitis.  Patient has been on Cipro and Flagyl also here. - Also concern for underlying side effects from  immunotherapy Keytruda - patient has chronic diarrhea secondary to radiation for her cervical cancer - dc c.diff, GI studies and contact isolation - advance diet today as tolerated - IV fluids -Added Bentyl.  Downgraded diet to clear liquids again  2. Metastatic cervical cancer- s/p radiation and chemo (stopped due to intolerance - has metastatic pulmonary nodules as well - currently on palliative immunotherapy with [place]. Appreciate oncology consult  3.  Hypomagnesemia- replaced  4.  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  Adrenal insufficiency-known history of renal insufficiency.  Random cortisol this morning is extremely low.  Already started on Solu-Cortef today.  Monitor closely.  Patient is independent at baseline.  Lives at home with parents.   encourage ambulation   All the records are reviewed and case discussed with Care Management/Social Workerr. Management plans discussed with the patient, family and they are in agreement.  CODE STATUS: Full Code  TOTAL TIME TAKING CARE OF THIS PATIENT: 39 minutes.   POSSIBLE D/C IN  2 DAYS, DEPENDING ON CLINICAL CONDITION.   Gladstone Lighter M.D on 06/09/2019 at 10:35 AM  Between 7am to 6pm - Pager - [phone]  After 6pm go to [email or web address] - password [email or web address] [facility]  Office  [phone]  CC: Primary care physician; [place]

## 2019-06-10 ENCOUNTER — Encounter: Payer: Self-pay | Admitting: Gastroenterology

## 2019-06-10 ENCOUNTER — Encounter: Payer: Self-pay | Admitting: Internal Medicine

## 2019-06-10 LAB — BASIC METABOLIC PANEL
Anion gap: 8 (ref 5–15)
BUN: <5 mg/dL — ABNORMAL LOW (ref 6–20)
CO2: 25 mmol/L (ref 22–32)
Calcium: 8.9 mg/dL (ref 8.9–10.3)
Chloride: 104 mmol/L (ref 98–111)
Creatinine, Ser: 0.7 mg/dL (ref 0.44–1.00)
GFR calc Af Amer: >60 mL/min (ref >60)
GFR calc non Af Amer: >60 mL/min (ref >60)
Glucose, Bld: 123 mg/dL — ABNORMAL HIGH (ref 70–99)
Potassium: 3.1 mmol/L — ABNORMAL LOW (ref 3.5–5.1)
Sodium: 137 mmol/L (ref 135–145)

## 2019-06-10 LAB — ACTH: C206 ACTH: 32 pg/mL (ref 7.2–63.3)

## 2019-06-10 MED ORDER — HYDROCORTISONE 20 MG PO TABS: 20.0000 mg | ORAL_TABLET | Freq: Every day | ORAL | 0 refills | Status: DC

## 2019-06-10 MED ORDER — POTASSIUM CHLORIDE CRYS ER 20 MEQ PO TBCR: 20.0000 meq | EXTENDED_RELEASE_TABLET | Freq: Every day | ORAL | 0 refills | Status: DC | PRN

## 2019-06-10 MED ORDER — POTASSIUM CHLORIDE CRYS ER 20 MEQ PO TBCR
40.0000 meq | EXTENDED_RELEASE_TABLET | ORAL | Status: AC
  Administered 2019-06-10 (×2): 40 meq via ORAL
  Filled 2019-06-10: qty 4
  Filled 2019-06-10: qty 2

## 2019-06-10 NOTE — Progress Notes (Signed)
Joanna Hall   DOB:[birth year]   HK#:[id number]    Subjective: Patient states that she slept better last night.  She had mild nausea intermittent.  No vomiting.  No diarrhea.  Abdominal pain mild-intermittent.   Objective:  Vitals:   06/10/19 0339 06/10/19 0821  BP: 125/85 (!) 147/84  Pulse: 69 77  Resp: 18 20  Temp: 97.7 F (36.5 C) 98.1 F (36.7 C)  SpO2: 98% 98%     Intake/Output Summary (Last 24 hours) at 06/10/2019 0834 Last data filed at 06/09/2019 1900 Gross per 24 hour  Intake 1170 ml  Output -  Net 1170 ml    Physical Exam  Constitutional: She is oriented to person, place, and time and well-developed, well-nourished, and in no distress.  HENT:  Head: Normocephalic and atraumatic.  Mouth/Throat: Oropharynx is clear and moist. No oropharyngeal exudate.  Eyes: Pupils are equal, round, and reactive to light.  Neck: Normal range of motion. Neck supple.  Cardiovascular: Normal rate and regular rhythm.  Pulmonary/Chest: Effort normal and breath sounds normal. No respiratory distress. She has no wheezes.  Abdominal: Soft. Bowel sounds are normal. She exhibits no distension and no mass. There is no abdominal tenderness. There is no rebound and no guarding.  Musculoskeletal: Normal range of motion.        General: No tenderness or edema.  Neurological: She is alert and oriented to person, place, and time.  Skin: Skin is warm.  Psychiatric: Affect normal.     Labs:  Lab Results  Component Value Date   WBC 2.5 (L) 06/08/2019   HGB 11.1 (L) 06/08/2019   HCT 33.8 (L) 06/08/2019   MCV 100.3 (H) 06/08/2019   PLT 152 06/08/2019   NEUTROABS 3.4 06/07/2019    Lab Results  Component Value Date   NA 137 06/10/2019   K 3.1 (L) 06/10/2019   CL 104 06/10/2019   CO2 25 06/10/2019    Studies:  No results found.  Malignant neoplasm of overlapping sites of cervix [facility]) #53 year old female patient with a history of Cervical adenocarcinoma with metastasis to the lung  currently on immunotherapy is current admitted hospital for intractable nausea vomiting diarrhea worsening abdominal discomfort  #Cervical adenocarcinoma-on Keytruda status post cycle #2 approximate on July 13th. August 22 CT scan abdomen pelvis-increasing size of the lung nodules; otherwise no obvious disease noted in the abdomen pelvis.  Patient current symptoms are likely secondary to immunotherapy/see below  # Intractable nausea vomiting diarrhea-[patient has chronic diarrhea ?  Short bowel from previous radiation]-likely secondary to adrenal crisis-a.m. cortisol 0.6.  Awaiting aldosterone levels.  Currently on hydrocortisone 50 mg IV every 6.  Symptoms improved not completely resolved.  I would recommend 1 more day of IV steroids; and then switching over to oral steroids at discharge.   #History of DVT/PE-stable Xarelto  #Discussed with patient she is in agreement.  Discussed Dr. Tressia Miners.   Cammie Sickle, MD 06/10/2019  8:34 AM

## 2019-06-10 NOTE — Progress Notes (Signed)
Pt for discharge home / a/o. Instructions discussed with pt. Diet / activity  F/u and meds discussed. Verbalizes understanding. Port  Flushed and deaccessed per protocol.  No c/o waiting on ride.

## 2019-06-10 NOTE — Discharge Summary (Signed)
[facility] NAME: Joanna Hall    MR#:  [id number]  DATE OF BIRTH:  [birth year]  DATE OF ADMISSION:  06/07/2019   ADMITTING PHYSICIAN: Sela Hua, MD  DATE OF DISCHARGE:  06/10/19  PRIMARY CARE PHYSICIAN: [facility]   ADMISSION DIAGNOSIS:   Dr Rosendo Gros vomiting  DISCHARGE DIAGNOSIS:   Active Problems:   Intractable nausea and vomiting   SECONDARY DIAGNOSIS:   Past Medical History:  Diagnosis Date  . Abdominal pain 06/10/2018  . Abnormal cervical Papanicolaou smear 09/18/2017  . Anxiety   . Aortic atherosclerosis ([place])   . Arthritis    neck and knees  . 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 Blood transfusion without reported diagnosis   . Cervical cancer ([place]) 09/2016   mets lung  . Chronic anal fissure   . Chronic diarrhea   . Dyspnea   . Erosive gastropathy 09/18/2017  . Factor V Leiden mutation ([place])   . Fecal incontinence   . Genital warts   . GERD (gastroesophageal reflux disease)   . GI bleed 10/08/2018  . Heart murmur   . Hematochezia   . Hemorrhoids   . 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 Hepatitis, chronic ([place]) 05/05/2017  . History of cancer chemotherapy    completed 06/2017  . History of Clostridium difficile infection    while undergoing chemo.  Negative test 10/2017  . Ileocolic anastomotic leak   . 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 Intestinal infection due to Clostridium difficile 09/18/2017  . Macrocytic anemia with vitamin B12 deficiency   . Multiple gastric ulcers   . Nausea vomiting and diarrhea   . Pancolitis ([place]) 07/27/2018  . Perianal condylomata   . Pneumonia    History of  . Pulmonary nodules   . Rectal bleeding   . Small bowel obstruction ([place]) 08/2017  . Stiff neck    limited right turn  . 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  1. Abdominal pain with nausea and vomiting- ? Acute gastroenetritis -Also patient admits to chronic use of marijuana.  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  Patient has been on Cipro and Flagyl in the hospital.  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  Discontinue antibiotics at discharge.. - patient has chronic diarrhea secondary to radiation for her cervical cancer -Received fluids.  Stool studies were not done on admission as patient did not have any further diarrhea. -Able to tolerate soft diet prior to discharge. -On Levbid for abdominal pain.   -Outpatient follow-up recommended  2. Metastatic cervical cancer- s/p radiation and chemo (stopped due to intolerance - has metastatic pulmonary nodules as well - currently on palliative immunotherapy with [place]. Appreciate oncology consult  3.  Hypomagnesemia- replaced  4.  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  Adrenal insufficiency-known history of adrenal insufficiency.  Random cortisol this morning is extremely low.   -Electrolytes are within normal limits.  Blood pressure is also normal here.  She was started on  IV Solu-Cortef in the hospital.  Patient feels better and wants to go home.  Change to oral hydrocortisone at this time.  Will need outpatient follow-up with endocrinology.    Patient is independent at baseline.  Lives at home with parents.  Feels much better and wants to go home today  DISCHARGE CONDITIONS:   Guarded  CONSULTS OBTAINED:   Oncology consultation by Dr. Rogue Bussing  DRUG ALLERGIES:   Allergies  Allergen Reactions  . Ketamine Anxiety and Other (See Comments)    Syncope  episode/confusion    DISCHARGE MEDICATIONS:   Allergies as of 06/10/2019      Reactions   Ketamine Anxiety, Other (See Comments)   Syncope episode/confusion      Medication List    STOP taking these medications   meclizine 25 MG tablet Commonly known as: ANTIVERT   sucralfate 1 g tablet Commonly known as: Carafate     TAKE these medications   amitriptyline 75 MG tablet Commonly known as: ELAVIL Take 1 tablet (75 mg total) by mouth at bedtime.   Calcium 500 +D 500-400 MG-UNIT Tabs Generic drug: Calcium Carb-Cholecalciferol Take 2 tablets by mouth daily.   diphenoxylate-atropine 2.5-0.025 MG tablet Commonly known as: LOMOTIL TAKE (2) TABLETS FOUR TIMES DAILY.   famotidine 20 MG tablet Commonly known as: PEPCID Take 1 tablet (20 mg total) by mouth 2 (two) times daily for 5 days.   feeding supplement (ENSURE ENLIVE) Liqd Take 237 mLs by mouth 3 (three) times daily between meals.   hydrocortisone 20 MG tablet Commonly known as: CORTEF Take 1 tablet (20 mg total) by mouth daily.   hydrOXYzine 10 MG tablet Commonly known as: ATARAX/VISTARIL Take 1 tablet (10 mg total) by mouth 3 (three) times daily as needed.   hyoscyamine 0.375 MG 12 hr tablet Commonly known as: LEVBID Take 1 tablet (0.375 mg total)by mouth 2 (two) times daily.   multivitamin with minerals tablet Take 1 tablet by mouth daily.   ondansetron 4 MG disintegrating tablet Commonly known as: Zofran ODT Take 1 tablet (4 mg total) by mouth every 8 (eight) hours as needed for nausea or vomiting.   oxyCODONE 15 mg 12 hr tablet Commonly known as: OXYCONTIN Take 1 tablet (15 mg total) by mouth every 12 (twelve) hours.   Oxycodone HCl 10 MG Tabs Take 1 tablet (10 mg total) by mouth every 6 (six) hours as needed (for breakthrough pain).   pantoprazole 40 MG tablet Commonly known as: PROTONIX Take 1 tablet (40 mg total) by mouth daily.   potassium chloride SA 20 MEQ tablet Commonly known as: K-DUR  Take 1 tablet (20 mEq total) by mouth daily as needed (if having nausea/vomiting- to supplements for lost potassium). What changed:   when to take this  reasons to take this   promethazine 25 MG tablet Commonly known as: PHENERGAN Take 1 tablet (25 mg total) by mouth every 8 (eight) hours as needed for nausea or vomiting.   rivaroxaban 20 MG Tabs tablet Commonly known as: XARELTO Take 1 tablet (20 mg total) by mouth daily with supper. What changed: additional instructions   Ventolin HFA 108 (90 Base) MCG/ACT inhaler Generic drug: albuterol Inhale 1-2 puffs into the lungs every 4 (four) hours as needed for shortness of breath.        DISCHARGE INSTRUCTIONS:   1.  PCP follow-up in 1 to 2 weeks 2.  Oncology follow-up as per schedule 3.  Endocrinology follow-up in 1 week  DIET:   Cardiac diet  ACTIVITY:   Activity as tolerated  OXYGEN:   Home Oxygen: No.  Oxygen Delivery: room air  DISCHARGE LOCATION:   home   If you experience worsening of your admission symptoms, develop shortness of breath, life threatening emergency, suicidal or homicidal thoughts you must seek medical attention immediately by calling 911 or calling your MD immediately  if symptoms less severe.  You Must read complete instructions/literature along with all the possible adverse reactions/side effects for all the Medicines you take and that have been prescribed to you. Take any new Medicines after you have completely understood and accpet all the possible adverse reactions/side effects.   Please note  You were cared for by a hospitalist during your hospital stay. If you have any questions about your discharge medications or the care you received while you were in the hospital after you are discharged, you can call the unit and asked to speak with the hospitalist on call if the hospitalist that took care of you is not available. Once you are discharged, your primary care physician will handle any  further medical issues. Please note that NO REFILLS for any discharge medications will be authorized once you are discharged, as it is imperative that you return to your primary care physician (or establish a relationship with a primary care physician if you do not have one) for your aftercare needs so that they can reassess your need for medications and monitor your lab values.    On the day of Discharge:  VITAL SIGNS:   Blood pressure (!) 147/84, pulse 77, temperature 98.1 F (36.7 C), temperature source Oral, resp. rate 20, height 5' 8"  (1.727 m), weight 73.1 kg, SpO2 98 %.  PHYSICAL EXAMINATION:    GENERAL:  53 y.o.-year-old patient lying in the bed with no acute distress.  EYES: Pupils equal, round, reactive to light and accommodation. No scleral icterus. Extraocular muscles intact.  HEENT: Head atraumatic, normocephalic. Oropharynx and nasopharynx clear.  NECK:  Supple, no jugular venous distention. No thyroid enlargement, no tenderness.  LUNGS: Normal breath sounds bilaterally, no wheezing, rales,rhonchi or crepitation. No use of accessory muscles of respiration. Decreased bibasilar breath sounds CARDIOVASCULAR: S1, S2 normal. No murmurs, rubs, or gallops.  ABDOMEN: Soft,  improved tenderness all over the abdomen- no guarding or rigidity, , nondistended. Bowel sounds present. No organomegaly or mass.  EXTREMITIES: No pedal edema, cyanosis, or clubbing.  NEUROLOGIC: Cranial nerves II through XII are intact. Muscle strength 5/5 in all extremities. Sensation intact. Gait not checked.  PSYCHIATRIC: The patient is alert and oriented x 3.  SKIN: No obvious rash, lesion, or ulcer.   DATA REVIEW:   CBC Recent Labs  Lab 06/08/19 0518  WBC 2.5*  HGB 11.1*  HCT 33.8*  PLT 152    Chemistries  Recent Labs  Lab 06/07/19 1000 06/08/19 0518  06/10/19 0558  NA 135 136   < > 137  K 3.6 4.0   < > 3.1*  CL 100 105   < > 104  CO2 22 24   < > 25  GLUCOSE 85 69*   < > 123*  BUN 14 7    < > <5*  CREATININE 0.78 0.72   < > 0.70  CALCIUM 9.8 9.0   < > 8.9  MG 1.4* 1.6*  --   --   AST 48*  --   --   --   ALT 67*  --   --   --   ALKPHOS 170*  --   --   --  BILITOT 0.8  --   --   --    < > = values in this interval not displayed.     Microbiology Results  Results for orders placed or performed during the hospital encounter of 06/07/19  Blood culture (routine x 2)     Status: None (Preliminary result)   Collection Time: 06/07/19  1:23 PM   Specimen: BLOOD  Result Value Ref Range Status   Specimen Description BLOOD BLOOD  Final   Special Requests   Final    BOTTLES DRAWN AEROBIC AND ANAEROBIC Blood Culture adequate volume   Culture   Final    NO GROWTH 3 DAYS Performed at [facility], [address], [place], [place] [address]    Report Status PENDING  Incomplete  SARS CORONAVIRUS 2 Nasal Swab Aptima Multi Swab     Status: None   Collection Time: 06/07/19  1:52 PM   Specimen: Aptima Multi Swab; Nasal Swab  Result Value Ref Range Status   SARS Coronavirus 2 NEGATIVE NEGATIVE Final    Comment: (NOTE) SARS-CoV-2 target nucleic acids are NOT DETECTED. The SARS-CoV-2 RNA is generally detectable in upper and lower respiratory specimens during the acute phase of infection. Negative results do not preclude SARS-CoV-2 infection, do not rule out co-infections with other pathogens, and should not be used as the sole basis for treatment or other patient management decisions. Negative results must be combined with clinical observations, patient history, and epidemiological information. The expected result is Negative. Fact Sheet for Patients: SugarRoll.be Fact Sheet for Healthcare Providers: https://www.woods-mathews.com/ This test is not yet approved or cleared by the [place] FDA and  has been authorized for detection and/or diagnosis of SARS-CoV-2 by FDA under an Emergency Use Authorization (EUA). This EUA will  remain  in effect (meaning this test can be used) for the duration of the COVID-19 declaration under Section 56 4(b)(1) of the Act, 21 U.S.C. section 360bbb-3(b)(1), unless the authorization is terminated or revoked sooner. Performed at [facility], [place] [address] [place], [place] [address]   Blood culture (routine x 2)     Status: None (Preliminary result)   Collection Time: 06/07/19  3:57 PM   Specimen: BLOOD  Result Value Ref Range Status   Specimen Description BLOOD BLOOD LEFT HAND  Final   Special Requests   Final    AEROBIC BOTTLE ONLY Blood Culture results may not be optimal due to an inadequate volume of blood received in culture bottles   Culture   Final    NO GROWTH 3 DAYS Performed at [facility], [address], [place], [place] [address]    Report Status PENDING  Incomplete   *Note: Due to a large number of results and/or encounters for the requested time period, some results have not been displayed. A complete set of results can be found in Results Review.    RADIOLOGY:  No results found.   Management plans discussed with the patient, family and they are in agreement.  CODE STATUS:     Code Status Orders  (From admission, onward)         Start     Ordered   06/07/19 1536  Full code  Continuous     06/07/19 1536        Code Status History    Date Active Date Inactive Code Status Order ID Comments User Context   05/19/2019 0313 05/22/2019 1545 Full Code [id number]  Mayer Camel, NP Inpatient  11/19/2018 1803 11/26/2018 2238 Full Code [id number]  Nicholes Mango, MD Inpatient   10/17/2018 1910 10/18/2018 2119 Full Code [id number]  Hillary Bow, MD Inpatient   10/08/2018 1443 10/11/2018 1920 Full Code [id number]  Nicholes Mango, MD Inpatient   07/27/2018 1621 08/01/2018 1432 Full Code [id number]  Demetrios Loll, MD Inpatient   06/10/2018 1754 06/12/2018 1614 Full Code [id number]  Dustin Flock, MD Inpatient   04/24/2018 1451 04/27/2018 1700 Full Code  [id number]  Lovell Sheehan, MD Inpatient   12/03/2017 1619 12/08/2017 1606 Full Code [id number]  Epifanio Lesches, MD ED   09/12/2017 0024 09/15/2017 1721 Full Code [id number]  [name], MD ED   08/27/2017 0223 09/05/2017 1355 Full Code [id number]  Herbert Pun, MD Inpatient   Advance Care Planning Activity    Advance Directive Documentation     Most Recent Value  Type of Advance Directive  Healthcare Power of [place], Living will  Pre-existing out of facility DNR order (yellow form or pink MOST form)  -  "MOST" Form in Place?  -      TOTAL TIME TAKING CARE OF THIS PATIENT: 38 minutes.    Gladstone Lighter M.D on 06/10/2019 at 2:39 PM  Between 7am to 6pm - Pager - [phone]  After 6pm go to [email or web address] - Proofreader  [facility] [place] [facility]  Office  [phone]  CC: Primary care physician; [place]   Note: This dictation was prepared with Dragon dictation along with smaller phrase technology. Any transcriptional errors that result from this process are unintentional.

## 2019-06-10 NOTE — Plan of Care (Signed)
  Problem: Education: Goal: Knowledge of General Education information will improve Description: Including pain rating scale, medication(s)/side effects and non-pharmacologic comfort measures Outcome: Progressing   Problem: Clinical Measurements: Goal: Ability to maintain clinical measurements within normal limits will improve Outcome: Progressing   Problem: Activity: Goal: Risk for activity intolerance will decrease Outcome: Progressing   Problem: Nutrition: Goal: Adequate nutrition will be maintained Outcome: Progressing   Problem: Elimination: Goal: Will not experience complications related to bowel motility Outcome: Progressing   Problem: Pain Managment: Goal: General experience of comfort will improve Outcome: Progressing   Problem: Safety: Goal: Ability to remain free from injury will improve Outcome: Progressing

## 2019-06-10 NOTE — Consult Note (Signed)
Pharmacy Antibiotic Note  Joanna Hall is a 53 y.o. female admitted on 06/07/2019 with intra-abdominal infection.  Pharmacy has been consulted for ciprofloxacin dosing. Patient is also on metronidazole.  Pt still with n/v/d.   Plan: Will Continue Ciprofloxacin 400 mg IV q12h   Height: 5' 8"  (172.7 cm) Weight: 161 lb 2.5 oz (73.1 kg) IBW/kg (Calculated) : 63.9  Temp (24hrs), Avg:98.1 F (36.7 C), Min:97.7 F (36.5 C), Max:98.8 F (37.1 C)  Recent Labs  Lab 06/05/19 0806 06/07/19 1000 06/08/19 0518 06/09/19 0400 06/10/19 0558  WBC 5.1 4.8 2.5*  --   --   CREATININE 0.77 0.78 0.72 0.83 0.70    Estimated Creatinine Clearance: 83.9 mL/min (by C-G formula based on SCr of 0.7 mg/dL).    Allergies  Allergen Reactions  . Ketamine Anxiety and Other (See Comments)    Syncope episode/confusion     Antimicrobials this admission: Ciprofloxacin 8/22 >>  Metronidazole 8/22 >>  Dose adjustments this admission: N/A  Microbiology results: 8/22 Bcx: NG x 3 days  Thank you for allowing pharmacy to be a part of this patient's care.  Lu Duffel, PharmD, BCPS Clinical Pharmacist 06/10/2019 7:15 AM

## 2019-06-11 ENCOUNTER — Other Ambulatory Visit: Payer: Self-pay

## 2019-06-12 ENCOUNTER — Other Ambulatory Visit: Payer: Self-pay

## 2019-06-12 ENCOUNTER — Inpatient Hospital Stay: Payer: Medicaid Other | Admitting: *Deleted

## 2019-06-12 ENCOUNTER — Other Ambulatory Visit: Payer: Self-pay | Admitting: Internal Medicine

## 2019-06-12 ENCOUNTER — Inpatient Hospital Stay: Payer: Medicaid Other

## 2019-06-12 DIAGNOSIS — Z5112 Encounter for antineoplastic immunotherapy: Secondary | ICD-10-CM | POA: Diagnosis present

## 2019-06-12 DIAGNOSIS — C7800 Secondary malignant neoplasm of unspecified lung: Secondary | ICD-10-CM | POA: Diagnosis not present

## 2019-06-12 DIAGNOSIS — Z95828 Presence of other vascular implants and grafts: Secondary | ICD-10-CM

## 2019-06-12 DIAGNOSIS — G629 Polyneuropathy, unspecified: Secondary | ICD-10-CM | POA: Diagnosis not present

## 2019-06-12 DIAGNOSIS — Z923 Personal history of irradiation: Secondary | ICD-10-CM | POA: Diagnosis not present

## 2019-06-12 DIAGNOSIS — Z86718 Personal history of other venous thrombosis and embolism: Secondary | ICD-10-CM | POA: Diagnosis not present

## 2019-06-12 DIAGNOSIS — C538 Malignant neoplasm of overlapping sites of cervix uteri: Secondary | ICD-10-CM

## 2019-06-12 DIAGNOSIS — C539 Malignant neoplasm of cervix uteri, unspecified: Secondary | ICD-10-CM | POA: Diagnosis not present

## 2019-06-12 DIAGNOSIS — Z7901 Long term (current) use of anticoagulants: Secondary | ICD-10-CM | POA: Diagnosis not present

## 2019-06-12 DIAGNOSIS — Z86711 Personal history of pulmonary embolism: Secondary | ICD-10-CM | POA: Diagnosis not present

## 2019-06-12 DIAGNOSIS — Z79899 Other long term (current) drug therapy: Secondary | ICD-10-CM | POA: Diagnosis not present

## 2019-06-12 DIAGNOSIS — Z9221 Personal history of antineoplastic chemotherapy: Secondary | ICD-10-CM | POA: Diagnosis not present

## 2019-06-12 DIAGNOSIS — Z90722 Acquired absence of ovaries, bilateral: Secondary | ICD-10-CM | POA: Diagnosis not present

## 2019-06-12 DIAGNOSIS — Z7952 Long term (current) use of systemic steroids: Secondary | ICD-10-CM | POA: Diagnosis not present

## 2019-06-12 LAB — CULTURE, BLOOD (ROUTINE X 2)
Culture: NO GROWTH
Culture: NO GROWTH
Special Requests: ADEQUATE

## 2019-06-12 LAB — COMPREHENSIVE METABOLIC PANEL
ALT: 33 U/L (ref 0–44)
AST: 28 U/L (ref 15–41)
Albumin: 3.6 g/dL (ref 3.5–5.0)
Alkaline Phosphatase: 148 U/L — ABNORMAL HIGH (ref 38–126)
Anion gap: 9 (ref 5–15)
BUN: 10 mg/dL (ref 6–20)
CO2: 24 mmol/L (ref 22–32)
Calcium: 8.7 mg/dL — ABNORMAL LOW (ref 8.9–10.3)
Chloride: 105 mmol/L (ref 98–111)
Creatinine, Ser: 0.91 mg/dL (ref 0.44–1.00)
GFR calc Af Amer: >60 mL/min (ref >60)
GFR calc non Af Amer: >60 mL/min (ref >60)
Glucose, Bld: 105 mg/dL — ABNORMAL HIGH (ref 70–99)
Potassium: 3.1 mmol/L — ABNORMAL LOW (ref 3.5–5.1)
Sodium: 138 mmol/L (ref 135–145)
Total Bilirubin: 0.4 mg/dL (ref 0.3–1.2)
Total Protein: 7.2 g/dL (ref 6.5–8.1)

## 2019-06-12 LAB — ALDOSTERONE: Aldosterone: 2.2 ng/dL (ref 0.0–30.0)

## 2019-06-12 LAB — CBC WITH DIFFERENTIAL/PLATELET
Abs Immature Granulocytes: 0.02 10*3/uL (ref 0.00–0.07)
Basophils Absolute: 0 10*3/uL (ref 0.0–0.1)
Basophils Relative: 0 %
Eosinophils Absolute: 0.1 10*3/uL (ref 0.0–0.5)
Eosinophils Relative: 1 %
HCT: 35.4 % — ABNORMAL LOW (ref 36.0–46.0)
Hemoglobin: 11.8 g/dL — ABNORMAL LOW (ref 12.0–15.0)
Immature Granulocytes: 0 %
Lymphocytes Relative: 20 %
Lymphs Abs: 1.3 10*3/uL (ref 0.7–4.0)
MCH: 33.1 pg (ref 26.0–34.0)
MCHC: 33.3 g/dL (ref 30.0–36.0)
MCV: 99.4 fL (ref 80.0–100.0)
Monocytes Absolute: 0.3 10*3/uL (ref 0.1–1.0)
Monocytes Relative: 5 %
Neutro Abs: 4.7 10*3/uL (ref 1.7–7.7)
Neutrophils Relative %: 74 %
Platelets: 204 10*3/uL (ref 150–400)
RBC: 3.56 MIL/uL — ABNORMAL LOW (ref 3.87–5.11)
RDW: 13.2 % (ref 11.5–15.5)
WBC: 6.4 10*3/uL (ref 4.0–10.5)
nRBC: 0 % (ref 0.0–0.2)

## 2019-06-12 LAB — MAGNESIUM: Magnesium: 1.4 mg/dL — ABNORMAL LOW (ref 1.7–2.4)

## 2019-06-12 MED ORDER — HEPARIN SOD (PORK) LOCK FLUSH 100 UNIT/ML IV SOLN
500.0000 [IU] | Freq: Once | INTRAVENOUS | Status: AC | PRN
  Administered 2019-06-12: 11:00:00 500 [IU]
  Filled 2019-06-12: qty 5

## 2019-06-12 MED ORDER — SODIUM CHLORIDE 0.9 % IV SOLN
Freq: Once | INTRAVENOUS | Status: AC
  Administered 2019-06-12: 09:00:00 via INTRAVENOUS
  Filled 2019-06-12: qty 250

## 2019-06-12 MED ORDER — MAGNESIUM SULFATE 4 GM/100ML IV SOLN
4.0000 g | Freq: Once | INTRAVENOUS | Status: AC
  Administered 2019-06-12: 09:00:00 4 g via INTRAVENOUS
  Filled 2019-06-12: qty 100

## 2019-06-12 MED ORDER — SODIUM CHLORIDE 0.9% FLUSH
10.0000 mL | Freq: Once | INTRAVENOUS | Status: AC
  Administered 2019-06-12: 10 mL via INTRAVENOUS
  Filled 2019-06-12: qty 10

## 2019-06-12 NOTE — Progress Notes (Signed)
Reviewed labs with Dr. Rogue Bussing, per MD pt to receive 4 grams of IV Magnesium only today.

## 2019-06-13 ENCOUNTER — Other Ambulatory Visit: Payer: Self-pay

## 2019-06-13 ENCOUNTER — Encounter: Payer: Self-pay | Admitting: Emergency Medicine

## 2019-06-13 DIAGNOSIS — Z96652 Presence of left artificial knee joint: Secondary | ICD-10-CM | POA: Diagnosis present

## 2019-06-13 DIAGNOSIS — G8929 Other chronic pain: Secondary | ICD-10-CM | POA: Diagnosis present

## 2019-06-13 DIAGNOSIS — D649 Anemia, unspecified: Secondary | ICD-10-CM | POA: Diagnosis present

## 2019-06-13 DIAGNOSIS — I700 Atherosclerosis of aorta: Secondary | ICD-10-CM | POA: Diagnosis present

## 2019-06-13 DIAGNOSIS — Z90721 Acquired absence of ovaries, unilateral: Secondary | ICD-10-CM

## 2019-06-13 DIAGNOSIS — Z888 Allergy status to other drugs, medicaments and biological substances status: Secondary | ICD-10-CM

## 2019-06-13 DIAGNOSIS — F1290 Cannabis use, unspecified, uncomplicated: Secondary | ICD-10-CM | POA: Diagnosis present

## 2019-06-13 DIAGNOSIS — E876 Hypokalemia: Secondary | ICD-10-CM | POA: Diagnosis present

## 2019-06-13 DIAGNOSIS — C539 Malignant neoplasm of cervix uteri, unspecified: Secondary | ICD-10-CM | POA: Diagnosis present

## 2019-06-13 DIAGNOSIS — K520 Gastroenteritis and colitis due to radiation: Principal | ICD-10-CM | POA: Diagnosis present

## 2019-06-13 DIAGNOSIS — Z9049 Acquired absence of other specified parts of digestive tract: Secondary | ICD-10-CM

## 2019-06-13 DIAGNOSIS — G893 Neoplasm related pain (acute) (chronic): Secondary | ICD-10-CM | POA: Diagnosis present

## 2019-06-13 DIAGNOSIS — B182 Chronic viral hepatitis C: Secondary | ICD-10-CM | POA: Diagnosis present

## 2019-06-13 DIAGNOSIS — Z87891 Personal history of nicotine dependence: Secondary | ICD-10-CM

## 2019-06-13 DIAGNOSIS — Z79899 Other long term (current) drug therapy: Secondary | ICD-10-CM

## 2019-06-13 DIAGNOSIS — Z8701 Personal history of pneumonia (recurrent): Secondary | ICD-10-CM

## 2019-06-13 DIAGNOSIS — Z20828 Contact with and (suspected) exposure to other viral communicable diseases: Secondary | ICD-10-CM | POA: Diagnosis present

## 2019-06-13 DIAGNOSIS — Y842 Radiological procedure and radiotherapy as the cause of abnormal reaction of the patient, or of later complication, without mention of misadventure at the time of the procedure: Secondary | ICD-10-CM | POA: Diagnosis present

## 2019-06-13 DIAGNOSIS — Z86711 Personal history of pulmonary embolism: Secondary | ICD-10-CM

## 2019-06-13 DIAGNOSIS — K219 Gastro-esophageal reflux disease without esophagitis: Secondary | ICD-10-CM | POA: Diagnosis present

## 2019-06-13 DIAGNOSIS — Z86718 Personal history of other venous thrombosis and embolism: Secondary | ICD-10-CM

## 2019-06-13 DIAGNOSIS — E2740 Unspecified adrenocortical insufficiency: Secondary | ICD-10-CM | POA: Diagnosis present

## 2019-06-13 DIAGNOSIS — Z8711 Personal history of peptic ulcer disease: Secondary | ICD-10-CM

## 2019-06-13 DIAGNOSIS — E559 Vitamin D deficiency, unspecified: Secondary | ICD-10-CM | POA: Diagnosis present

## 2019-06-13 DIAGNOSIS — M47812 Spondylosis without myelopathy or radiculopathy, cervical region: Secondary | ICD-10-CM | POA: Diagnosis present

## 2019-06-13 DIAGNOSIS — Z7901 Long term (current) use of anticoagulants: Secondary | ICD-10-CM

## 2019-06-13 DIAGNOSIS — C7800 Secondary malignant neoplasm of unspecified lung: Secondary | ICD-10-CM | POA: Diagnosis present

## 2019-06-13 DIAGNOSIS — D6851 Activated protein C resistance: Secondary | ICD-10-CM | POA: Diagnosis present

## 2019-06-13 LAB — COMPREHENSIVE METABOLIC PANEL
ALT: 40 U/L (ref 0–44)
AST: 36 U/L (ref 15–41)
Albumin: 3.5 g/dL (ref 3.5–5.0)
Alkaline Phosphatase: 122 U/L (ref 38–126)
Anion gap: 11 (ref 5–15)
BUN: 10 mg/dL (ref 6–20)
CO2: 20 mmol/L — ABNORMAL LOW (ref 22–32)
Calcium: 8.2 mg/dL — ABNORMAL LOW (ref 8.9–10.3)
Chloride: 108 mmol/L (ref 98–111)
Creatinine, Ser: 0.61 mg/dL (ref 0.44–1.00)
GFR calc Af Amer: >60 mL/min (ref >60)
GFR calc non Af Amer: >60 mL/min (ref >60)
Glucose, Bld: 153 mg/dL — ABNORMAL HIGH (ref 70–99)
Potassium: 2.2 mmol/L — CL (ref 3.5–5.1)
Sodium: 139 mmol/L (ref 135–145)
Total Bilirubin: 0.8 mg/dL (ref 0.3–1.2)
Total Protein: 6.7 g/dL (ref 6.5–8.1)

## 2019-06-13 LAB — TROPONIN I (HIGH SENSITIVITY): Troponin I (High Sensitivity): 4 ng/L (ref <18)

## 2019-06-13 LAB — LIPASE, BLOOD: Lipase: 28 U/L (ref 11–51)

## 2019-06-13 LAB — CBC
HCT: 34.1 % — ABNORMAL LOW (ref 36.0–46.0)
Hemoglobin: 11.4 g/dL — ABNORMAL LOW (ref 12.0–15.0)
MCH: 33 pg (ref 26.0–34.0)
MCHC: 33.4 g/dL (ref 30.0–36.0)
MCV: 98.8 fL (ref 80.0–100.0)
Platelets: 184 10*3/uL (ref 150–400)
RBC: 3.45 MIL/uL — ABNORMAL LOW (ref 3.87–5.11)
RDW: 13.6 % (ref 11.5–15.5)
WBC: 7 10*3/uL (ref 4.0–10.5)
nRBC: 0 % (ref 0.0–0.2)

## 2019-06-13 MED ORDER — PROMETHAZINE HCL 25 MG/ML IJ SOLN
12.5000 mg | Freq: Once | INTRAMUSCULAR | Status: AC
  Administered 2019-06-13: 12.5 mg via INTRAVENOUS

## 2019-06-13 MED ORDER — SODIUM CHLORIDE 0.9 % IV BOLUS
1000.0000 mL | Freq: Once | INTRAVENOUS | Status: AC
  Administered 2019-06-13: 23:00:00 1000 mL via INTRAVENOUS

## 2019-06-13 MED ORDER — ONDANSETRON HCL 4 MG/2ML IJ SOLN: 4.0000 mg | Freq: Once | INTRAMUSCULAR | Status: DC | PRN

## 2019-06-13 NOTE — ED Notes (Signed)
Date and time results received: 06/13/19  (use smartphrase ".now" to insert current time)  Test: Potassium Critical Value: 2.2  Name of Provider Notified: Dr. Beather Arbour  Orders Received? Or Actions Taken?: [email or web address]

## 2019-06-13 NOTE — ED Triage Notes (Signed)
Patient states that she developed central chest pain, vomiting and diarrhea that started 2 hours ago. Patient states that she was just discharged from the hospital on Wednesday for the same. Patient states she has metastatic carcinoma and had her last immunotherapy treatment 2 weeks ago.

## 2019-06-14 ENCOUNTER — Encounter: Payer: Self-pay | Admitting: Gastroenterology

## 2019-06-14 ENCOUNTER — Inpatient Hospital Stay
Admission: EM | Admit: 2019-06-14 | Discharge: 2019-06-15 | DRG: 394 | Disposition: A | Discharge status: Home / Self Care | Payer: Medicaid Other | Attending: Internal Medicine | Admitting: Internal Medicine

## 2019-06-14 DIAGNOSIS — D6851 Activated protein C resistance: Secondary | ICD-10-CM | POA: Diagnosis present

## 2019-06-14 DIAGNOSIS — K520 Gastroenteritis and colitis due to radiation: Secondary | ICD-10-CM | POA: Diagnosis present

## 2019-06-14 DIAGNOSIS — E2740 Unspecified adrenocortical insufficiency: Secondary | ICD-10-CM | POA: Diagnosis present

## 2019-06-14 DIAGNOSIS — F1290 Cannabis use, unspecified, uncomplicated: Secondary | ICD-10-CM | POA: Diagnosis present

## 2019-06-14 DIAGNOSIS — I700 Atherosclerosis of aorta: Secondary | ICD-10-CM | POA: Diagnosis present

## 2019-06-14 DIAGNOSIS — K529 Noninfective gastroenteritis and colitis, unspecified: Secondary | ICD-10-CM | POA: Diagnosis present

## 2019-06-14 DIAGNOSIS — Z20828 Contact with and (suspected) exposure to other viral communicable diseases: Secondary | ICD-10-CM | POA: Diagnosis present

## 2019-06-14 DIAGNOSIS — Z86711 Personal history of pulmonary embolism: Secondary | ICD-10-CM | POA: Diagnosis not present

## 2019-06-14 DIAGNOSIS — Y842 Radiological procedure and radiotherapy as the cause of abnormal reaction of the patient, or of later complication, without mention of misadventure at the time of the procedure: Secondary | ICD-10-CM | POA: Diagnosis present

## 2019-06-14 DIAGNOSIS — B182 Chronic viral hepatitis C: Secondary | ICD-10-CM | POA: Diagnosis present

## 2019-06-14 DIAGNOSIS — Z79899 Other long term (current) drug therapy: Secondary | ICD-10-CM | POA: Diagnosis not present

## 2019-06-14 DIAGNOSIS — K219 Gastro-esophageal reflux disease without esophagitis: Secondary | ICD-10-CM | POA: Diagnosis present

## 2019-06-14 DIAGNOSIS — D649 Anemia, unspecified: Secondary | ICD-10-CM | POA: Diagnosis present

## 2019-06-14 DIAGNOSIS — Z8701 Personal history of pneumonia (recurrent): Secondary | ICD-10-CM | POA: Diagnosis not present

## 2019-06-14 DIAGNOSIS — Z7901 Long term (current) use of anticoagulants: Secondary | ICD-10-CM | POA: Diagnosis not present

## 2019-06-14 DIAGNOSIS — Z96652 Presence of left artificial knee joint: Secondary | ICD-10-CM | POA: Diagnosis present

## 2019-06-14 DIAGNOSIS — Z86718 Personal history of other venous thrombosis and embolism: Secondary | ICD-10-CM | POA: Diagnosis not present

## 2019-06-14 DIAGNOSIS — E876 Hypokalemia: Secondary | ICD-10-CM

## 2019-06-14 DIAGNOSIS — R112 Nausea with vomiting, unspecified: Secondary | ICD-10-CM

## 2019-06-14 DIAGNOSIS — C539 Malignant neoplasm of cervix uteri, unspecified: Secondary | ICD-10-CM | POA: Diagnosis present

## 2019-06-14 DIAGNOSIS — G8929 Other chronic pain: Secondary | ICD-10-CM | POA: Diagnosis present

## 2019-06-14 DIAGNOSIS — E559 Vitamin D deficiency, unspecified: Secondary | ICD-10-CM | POA: Diagnosis present

## 2019-06-14 DIAGNOSIS — M47812 Spondylosis without myelopathy or radiculopathy, cervical region: Secondary | ICD-10-CM | POA: Diagnosis present

## 2019-06-14 DIAGNOSIS — Z8711 Personal history of peptic ulcer disease: Secondary | ICD-10-CM | POA: Diagnosis not present

## 2019-06-14 DIAGNOSIS — C7800 Secondary malignant neoplasm of unspecified lung: Secondary | ICD-10-CM | POA: Diagnosis present

## 2019-06-14 DIAGNOSIS — G893 Neoplasm related pain (acute) (chronic): Secondary | ICD-10-CM | POA: Diagnosis present

## 2019-06-14 LAB — GASTROINTESTINAL PANEL BY PCR, STOOL (REPLACES STOOL CULTURE)

## 2019-06-14 LAB — COMPREHENSIVE METABOLIC PANEL
ALT: 40 U/L (ref 0–44)
AST: 38 U/L (ref 15–41)
Albumin: 2.9 g/dL — ABNORMAL LOW (ref 3.5–5.0)
Alkaline Phosphatase: 97 U/L (ref 38–126)
Anion gap: 7 (ref 5–15)
BUN: 8 mg/dL (ref 6–20)
CO2: 25 mmol/L (ref 22–32)
Calcium: 8.2 mg/dL — ABNORMAL LOW (ref 8.9–10.3)
Chloride: 107 mmol/L (ref 98–111)
Creatinine, Ser: 0.58 mg/dL (ref 0.44–1.00)
GFR calc Af Amer: >60 mL/min (ref >60)
GFR calc non Af Amer: >60 mL/min (ref >60)
Glucose, Bld: 95 mg/dL (ref 70–99)
Potassium: 3.1 mmol/L — ABNORMAL LOW (ref 3.5–5.1)
Sodium: 139 mmol/L (ref 135–145)
Total Bilirubin: 0.5 mg/dL (ref 0.3–1.2)
Total Protein: 5.9 g/dL — ABNORMAL LOW (ref 6.5–8.1)

## 2019-06-14 LAB — URINALYSIS, COMPLETE (UACMP) WITH MICROSCOPIC
Bacteria, UA: NONE SEEN
Bilirubin Urine: NEGATIVE
Glucose, UA: NEGATIVE mg/dL
Ketones, ur: NEGATIVE mg/dL
Nitrite: NEGATIVE
Protein, ur: NEGATIVE mg/dL
Specific Gravity, Urine: 1.013 (ref 1.005–1.030)
pH: 5 (ref 5.0–8.0)

## 2019-06-14 LAB — CBC
HCT: 29.3 % — ABNORMAL LOW (ref 36.0–46.0)
Hemoglobin: 9.6 g/dL — ABNORMAL LOW (ref 12.0–15.0)
MCH: 33.1 pg (ref 26.0–34.0)
MCHC: 32.8 g/dL (ref 30.0–36.0)
MCV: 101 fL — ABNORMAL HIGH (ref 80.0–100.0)
Platelets: 134 10*3/uL — ABNORMAL LOW (ref 150–400)
RBC: 2.9 MIL/uL — ABNORMAL LOW (ref 3.87–5.11)
RDW: 13.5 % (ref 11.5–15.5)
WBC: 4.7 10*3/uL (ref 4.0–10.5)
nRBC: 0 % (ref 0.0–0.2)

## 2019-06-14 LAB — POCT PREGNANCY, URINE: Preg Test, Ur: NEGATIVE

## 2019-06-14 LAB — MAGNESIUM
Magnesium: 1.6 mg/dL — ABNORMAL LOW (ref 1.7–2.4)
Magnesium: 1.8 mg/dL (ref 1.7–2.4)

## 2019-06-14 LAB — C DIFFICILE QUICK SCREEN W PCR REFLEX
C Diff antigen: NEGATIVE
C Diff interpretation: NOT DETECTED
C Diff toxin: NEGATIVE

## 2019-06-14 LAB — SARS CORONAVIRUS 2 BY RT PCR (HOSPITAL ORDER, PERFORMED IN ~~LOC~~ HOSPITAL LAB): SARS Coronavirus 2: NEGATIVE

## 2019-06-14 LAB — TROPONIN I (HIGH SENSITIVITY): Troponin I (High Sensitivity): 7 ng/L (ref <18)

## 2019-06-14 MED ORDER — ACETAMINOPHEN 650 MG RE SUPP: 650.0000 mg | Freq: Four times a day (QID) | RECTAL | Status: DC | PRN

## 2019-06-14 MED ORDER — RIVAROXABAN 20 MG PO TABS
20.0000 mg | ORAL_TABLET | Freq: Every day | ORAL | Status: DC
  Administered 2019-06-14: 22:00:00 20 mg via ORAL
  Filled 2019-06-14 (×3): qty 1

## 2019-06-14 MED ORDER — MAGNESIUM SULFATE 2 GM/50ML IV SOLN: 2.0000 g | Freq: Once | INTRAVENOUS | Status: DC

## 2019-06-14 MED ORDER — ENSURE ENLIVE PO LIQD
237.0000 mL | Freq: Three times a day (TID) | ORAL | Status: DC
  Administered 2019-06-14 – 2019-06-15 (×2): 237 mL via ORAL

## 2019-06-14 MED ORDER — TRAZODONE HCL 50 MG PO TABS: 25.0000 mg | ORAL_TABLET | Freq: Every evening | ORAL | Status: DC | PRN

## 2019-06-14 MED ORDER — POTASSIUM CHLORIDE CRYS ER 20 MEQ PO TBCR
20.0000 meq | EXTENDED_RELEASE_TABLET | ORAL | Status: AC
  Administered 2019-06-14 (×4): 20 meq via ORAL
  Filled 2019-06-14 (×4): qty 1

## 2019-06-14 MED ORDER — POTASSIUM CHLORIDE 20 MEQ PO PACK
40.0000 meq | PACK | Freq: Once | ORAL | Status: AC
  Administered 2019-06-14: 06:00:00 40 meq via ORAL

## 2019-06-14 MED ORDER — PANTOPRAZOLE SODIUM 40 MG IV SOLR
40.0000 mg | Freq: Two times a day (BID) | INTRAVENOUS | Status: DC
  Administered 2019-06-14 – 2019-06-15 (×3): 40 mg via INTRAVENOUS
  Filled 2019-06-14 (×3): qty 40

## 2019-06-14 MED ORDER — PROMETHAZINE HCL 25 MG/ML IJ SOLN
25.0000 mg | Freq: Once | INTRAMUSCULAR | Status: AC
  Administered 2019-06-14: 01:00:00 25 mg via INTRAVENOUS
  Filled 2019-06-14: qty 1

## 2019-06-14 MED ORDER — HYDROCORTISONE 10 MG PO TABS
20.0000 mg | ORAL_TABLET | Freq: Every day | ORAL | Status: DC
  Administered 2019-06-14 – 2019-06-15 (×2): 20 mg via ORAL
  Filled 2019-06-14 (×2): qty 2

## 2019-06-14 MED ORDER — OXYCODONE HCL ER 15 MG PO T12A
15.0000 mg | EXTENDED_RELEASE_TABLET | Freq: Two times a day (BID) | ORAL | Status: DC
  Administered 2019-06-14 – 2019-06-15 (×3): 15 mg via ORAL
  Filled 2019-06-14 (×3): qty 1

## 2019-06-14 MED ORDER — PROMETHAZINE HCL 25 MG PO TABS
25.0000 mg | ORAL_TABLET | Freq: Three times a day (TID) | ORAL | Status: DC | PRN
  Administered 2019-06-15: 12:00:00 25 mg via ORAL
  Filled 2019-06-14 (×2): qty 1

## 2019-06-14 MED ORDER — FAMOTIDINE 20 MG PO TABS
20.0000 mg | ORAL_TABLET | Freq: Two times a day (BID) | ORAL | Status: DC
  Administered 2019-06-14 – 2019-06-15 (×3): 20 mg via ORAL
  Filled 2019-06-14 (×3): qty 1

## 2019-06-14 MED ORDER — HYDROXYZINE HCL 10 MG PO TABS
10.0000 mg | ORAL_TABLET | Freq: Three times a day (TID) | ORAL | Status: DC | PRN
  Filled 2019-06-14: qty 1

## 2019-06-14 MED ORDER — POTASSIUM CHLORIDE 10 MEQ/100ML IV SOLN
10.0000 meq | INTRAVENOUS | Status: AC
  Administered 2019-06-14 (×2): 10 meq via INTRAVENOUS
  Filled 2019-06-14 (×2): qty 100

## 2019-06-14 MED ORDER — CALCIUM CARBONATE-VITAMIN D 500-200 MG-UNIT PO TABS
2.0000 | ORAL_TABLET | Freq: Every day | ORAL | Status: DC
  Administered 2019-06-14 – 2019-06-15 (×2): 2 via ORAL
  Filled 2019-06-14 (×2): qty 2

## 2019-06-14 MED ORDER — ONDANSETRON HCL 4 MG/2ML IJ SOLN
4.0000 mg | Freq: Once | INTRAMUSCULAR | Status: AC
  Administered 2019-06-14: 13:00:00 4 mg via INTRAVENOUS
  Filled 2019-06-14: qty 2

## 2019-06-14 MED ORDER — ACETAMINOPHEN 325 MG PO TABS: 650.0000 mg | ORAL_TABLET | Freq: Four times a day (QID) | ORAL | Status: DC | PRN

## 2019-06-14 MED ORDER — ALBUTEROL SULFATE (2.5 MG/3ML) 0.083% IN NEBU: 2.5000 mg | INHALATION_SOLUTION | RESPIRATORY_TRACT | Status: DC | PRN

## 2019-06-14 MED ORDER — MORPHINE SULFATE (PF) 2 MG/ML IV SOLN
1.0000 mg | INTRAVENOUS | Status: DC | PRN
  Administered 2019-06-14 (×3): 2 mg via INTRAVENOUS
  Filled 2019-06-14 (×3): qty 1

## 2019-06-14 MED ORDER — POTASSIUM CHLORIDE IN NACL 20-0.9 MEQ/L-% IV SOLN
INTRAVENOUS | Status: DC
  Administered 2019-06-14 – 2019-06-15 (×2): via INTRAVENOUS
  Filled 2019-06-14 (×6): qty 1000

## 2019-06-14 MED ORDER — ONDANSETRON 4 MG PO TBDP
4.0000 mg | ORAL_TABLET | Freq: Three times a day (TID) | ORAL | Status: DC | PRN
  Filled 2019-06-14: qty 1

## 2019-06-14 MED ORDER — ADULT MULTIVITAMIN W/MINERALS CH
1.0000 | ORAL_TABLET | Freq: Every day | ORAL | Status: DC
  Administered 2019-06-14 – 2019-06-15 (×2): 1 via ORAL
  Filled 2019-06-14 (×2): qty 1

## 2019-06-14 MED ORDER — SODIUM CHLORIDE 0.9 % IV SOLN
INTRAVENOUS | Status: DC
  Administered 2019-06-14: 06:00:00 800 mL via INTRAVENOUS

## 2019-06-14 MED ORDER — ONDANSETRON HCL 4 MG/2ML IJ SOLN
4.0000 mg | Freq: Four times a day (QID) | INTRAMUSCULAR | Status: DC | PRN
  Administered 2019-06-14 – 2019-06-15 (×4): 4 mg via INTRAVENOUS
  Filled 2019-06-14 (×4): qty 2

## 2019-06-14 MED ORDER — HYDROMORPHONE HCL 1 MG/ML IJ SOLN
1.0000 mg | Freq: Once | INTRAMUSCULAR | Status: AC
  Administered 2019-06-14: 03:00:00 1 mg via INTRAVENOUS
  Filled 2019-06-14: qty 1

## 2019-06-14 MED ORDER — SODIUM CHLORIDE 0.9 % IV BOLUS
1000.0000 mL | Freq: Once | INTRAVENOUS | Status: AC
  Administered 2019-06-14: 01:00:00 1000 mL via INTRAVENOUS

## 2019-06-14 MED ORDER — ONDANSETRON HCL 4 MG PO TABS: 4.0000 mg | ORAL_TABLET | Freq: Four times a day (QID) | ORAL | Status: DC | PRN

## 2019-06-14 MED ORDER — MAGNESIUM SULFATE 2 GM/50ML IV SOLN
2.0000 g | Freq: Once | INTRAVENOUS | Status: AC
  Administered 2019-06-14: 03:00:00 2 g via INTRAVENOUS
  Filled 2019-06-14: qty 50

## 2019-06-14 MED ORDER — HYOSCYAMINE SULFATE ER 0.375 MG PO TB12
0.3750 mg | ORAL_TABLET | Freq: Two times a day (BID) | ORAL | Status: DC | PRN
  Filled 2019-06-14: qty 1

## 2019-06-14 MED ORDER — AMITRIPTYLINE HCL 25 MG PO TABS
75.0000 mg | ORAL_TABLET | Freq: Every day | ORAL | Status: DC
  Administered 2019-06-14: 75 mg via ORAL
  Filled 2019-06-14: qty 3

## 2019-06-14 MED ORDER — HYDROMORPHONE HCL 1 MG/ML IJ SOLN
1.0000 mg | Freq: Once | INTRAMUSCULAR | Status: AC
  Administered 2019-06-14: 01:00:00 1 mg via INTRAVENOUS
  Filled 2019-06-14: qty 1

## 2019-06-14 MED ORDER — POTASSIUM CHLORIDE 20 MEQ PO PACK
40.0000 meq | PACK | Freq: Once | ORAL | Status: DC
  Filled 2019-06-14: qty 2

## 2019-06-14 MED ORDER — OXYCODONE HCL 5 MG PO TABS
10.0000 mg | ORAL_TABLET | Freq: Four times a day (QID) | ORAL | Status: DC | PRN
  Administered 2019-06-14: 08:00:00 10 mg via ORAL
  Filled 2019-06-14: qty 2

## 2019-06-14 NOTE — ED Notes (Signed)
Pt reports metastatic carcinoma receiving immunotherapy every 3 weeks last treatment 2 weeks ago.

## 2019-06-14 NOTE — H&P (Addendum)
[facility] [place] NAME: [name]    MR#:  [id number]  DATE OF BIRTH:  28-Jun-1966  DATE OF ADMISSION:  06/14/2019  PRIMARY CARE PHYSICIAN: [place]   REQUESTING/REFERRING PHYSICIAN: Gonzella Lex, MD  CHIEF COMPLAINT:   Chief Complaint  Patient presents with  . Chest Pain    HISTORY OF PRESENT ILLNESS:  Joanna Hall  is a 53 y.o. Caucasian female with a known history of multiple medical problems that will be mentioned below including metastatic cervical cancer, presented to the emergency room with acute onset of recurrent nausea and vomiting with associated watery diarrhea and chills without reported fever.  She denied any dysuria, oliguria or hematuria or flank pain.  She admitted to lower abdominal suprapubic and right lower quadrant pain.  She had an abdominal and pelvic CT scan on Monday that showed chronic colitis.  She denies any chest pain or palpitations.  Upon presentation to the emergency room blood pressure was 163/105 with otherwise normal vital signs.  Potassium was 2.2 and magnesium 1.6 with mild anemia.  COVID-19 test came back negative and urinalysis was unremarkable.  Urine pregnancy test came back negative.  EKG showed normal sinus rhythm with a rate of 91 with suspected left atrial enlargement.  The patient was given 1 mg of IV Dilaudid twice, 2 g of IV magnesium sulfate, 10 mcg of IV potassium chloride and 12.5 mg then 25 mg of IV Phenergan and 2 L bolus of IV normal saline.  She will be admitted to medical monitored bed for further evaluation and management. PAST MEDICAL HISTORY:   Past Medical History:  Diagnosis Date  . Abdominal pain 06/10/2018  . Abnormal cervical Papanicolaou smear 09/18/2017  . Anxiety   . Aortic atherosclerosis ([place])   . Arthritis    neck and knees  . 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 Blood transfusion without reported diagnosis    . Cervical cancer ([place]) 09/2016   mets lung  . Chronic anal fissure   . Chronic diarrhea   . Dyspnea   . Erosive gastropathy 09/18/2017  . Factor V Leiden mutation ([place])   . Fecal incontinence   . Genital warts   . GERD (gastroesophageal reflux disease)   . GI bleed 10/08/2018  . Heart murmur   . Hematochezia   . Hemorrhoids   . 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 Hepatitis, chronic ([place]) 05/05/2017  . History of cancer chemotherapy    completed 06/2017  . History of Clostridium difficile infection    while undergoing chemo.  Negative test 10/2017  . Ileocolic anastomotic leak   . 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 Intestinal infection due to Clostridium difficile 09/18/2017  . Macrocytic anemia with vitamin B12 deficiency   . Multiple gastric ulcers   . Nausea vomiting and diarrhea   . Pancolitis ([place]) 07/27/2018  . Perianal condylomata   . Pneumonia    History of  . Pulmonary nodules   . Rectal bleeding   . Small bowel obstruction ([place]) 08/2017  . Stiff neck    limited right turn  . Vitamin D deficiency     PAST SURGICAL HISTORY:   Past Surgical History:  Procedure Laterality Date  . CHOLECYSTECTOMY    . COLON SURGERY  08/2017   resection  . COLONOSCOPY WITH PROPOFOL  N/A 12/20/2017   Procedure: COLONOSCOPY WITH PROPOFOL;  Surgeon: [name], MD;  Location: [facility] ENDOSCOPY;  Service: Gastroenterology;  Laterality: N/A;  . COLONOSCOPY WITH PROPOFOL N/A 07/30/2018   Procedure: COLONOSCOPY WITH PROPOFOL;  Surgeon: [name], MD;  Location[facility] ENDOSCOPY;  Service: Gastroenterology;  Laterality: N/A;  . COLONOSCOPY WITH PROPOFOL N/A 10/10/2018   Procedure: COLONOSCOPY WITH PROPOFOL;  Surgeon: Lucilla Lame, MD;  Location: [facility] ENDOSCOPY;  Service: Endoscopy;  Laterality: N/A;  . DIAGNOSTIC LAPAROSCOPY    . ESOPHAGOGASTRODUODENOSCOPY (EGD) WITH PROPOFOL N/A 12/20/2017   Procedure:  ESOPHAGOGASTRODUODENOSCOPY (EGD) WITH PROPOFOL;  Surgeon: [name], MD;  Location: [place];  Service: Gastroenterology;  Laterality: N/A;  . ESOPHAGOGASTRODUODENOSCOPY (EGD) WITH PROPOFOL  07/30/2018   Procedure: ESOPHAGOGASTRODUODENOSCOPY (EGD) WITH PROPOFOL;  Surgeon: [name], MD;  Location: [facility] ENDOSCOPY;  Service: Gastroenterology;;  . Otho Darner SIGMOIDOSCOPY N/A 11/21/2018   Procedure: FLEXIBLE SIGMOIDOSCOPY;  Surgeon: [name], MD;  Location: [facility] ENDOSCOPY;  Service: Gastroenterology;  Laterality: N/A;  . LAPAROTOMY N/A 08/31/2017   Procedure: EXPLORATORY LAPAROTOMY for SBO, ileocolectomy, removal of piece of uterine wall;  Surgeon: [name], MD;  Location: [facility] ORS;  Service: General;  Laterality: N/A;  . LASER ABLATION CONDOLAMATA N/A 02/22/2018   Procedure: LASER ABLATION/REMOVAL OF GEZMOQHUTML AROUND ANUS AND VAGINA;  Surgeon: Michael Boston, MD;  Location: [place];  Service: General;  Laterality: N/A;  . OOPHORECTOMY    . PORTA CATH INSERTION N/A 05/13/2018   Procedure: PORTA CATH INSERTION;  Surgeon: Katha Cabal, MD;  Location: [place] CV LAB;  Service: Cardiovascular;  Laterality: N/A;  . SMALL INTESTINE SURGERY    . TANDEM RING INSERTION     x3  . THORACOTOMY    . TOTAL KNEE ARTHROPLASTY Left 04/24/2018   Procedure: TOTAL KNEE ARTHROPLASTY;  Surgeon: Lovell Sheehan, MD;  Location: [facility] ORS;  Service: Orthopedics;  Laterality: Left;    SOCIAL HISTORY:   Social History   Tobacco Use  . Smoking status: Former Smoker    Quit date: 10/16/2006    Years since quitting: 12.6  . Smokeless tobacco: Never Used  Substance Use Topics  . Alcohol use: Not Currently    Frequency: Never    Comment: seldom    FAMILY HISTORY:   Family History  Problem Relation Age of Onset  . Hypertension Father   . Diabetes Father   . Alcohol abuse Daughter   . Hypertension Maternal Grandmother   . Diabetes Maternal  Grandmother   . Diabetes Paternal Grandmother   . Hypertension Paternal Grandmother     DRUG ALLERGIES:   Allergies  Allergen Reactions  . Ketamine Anxiety and Other (See Comments)    Syncope episode/confusion     REVIEW OF SYSTEMS:   ROS As per history of present illness. All pertinent systems were reviewed above. Constitutional,  HEENT, cardiovascular, respiratory, GI, GU, musculoskeletal, neuro, psychiatric, endocrine,  integumentary and hematologic systems were reviewed and are otherwise  negative/unremarkable except for positive findings mentioned above in the HPI.   MEDICATIONS AT HOME:   Prior to Admission medications   Medication Sig Start Date End Date Taking? Authorizing Provider  amitriptyline (ELAVIL) 75 MG tablet Take 1 tablet (75 mg total) by mouth at bedtime. 05/06/19 08/04/19 Yes Vanga, Tally Due, MD  Calcium Carb-Cholecalciferol (CALCIUM 500 +D) 500-400 MG-UNIT TABS Take 2 tablets by mouth daily.   Yes [provider]  diphenoxylate-atropine (LOMOTIL) 2.5-0.025 MG tablet TAKE (2) TABLETS FOUR TIMES DAILY.  04/21/19  Yes Vanga, Tally Due, MD  hydrocortisone (CORTEF) 20 MG tablet Take 1 tablet (20 mg total) by mouth daily. 06/10/19  Yes Gladstone Lighter, MD  hydrOXYzine (ATARAX/VISTARIL) 10 MG tablet Take 1 tablet (10 mg total) by mouth 3 (three) times daily as needed. 06/13/19  Yes Cammie Sickle, MD  hyoscyamine (LEVBID) 0.375 MG 12 hr tablet Take 1 tablet (0.375 mg total)by mouth 2 (two) times daily. 01/03/19  Yes Vanga, Tally Due, MD  ondansetron (ZOFRAN ODT) 4 MG disintegrating tablet Take 1 tablet (4 mg total) by mouth every 8 (eight) hours as needed for nausea or vomiting. 03/20/19  Yes Verlon Au, NP  oxyCODONE (OXYCONTIN) 15 mg 12 hr tablet Take 1 tablet (15 mg total) by mouth every 12 (twelve) hours. 05/29/19  Yes Cammie Sickle, MD  Oxycodone HCl 10 MG TABS Take 1 tablet (10 mg total) by mouth every 6 (six) hours as needed (for  breakthrough pain). 06/06/19  Yes Jacquelin Hawking, NP  pantoprazole (PROTONIX) 40 MG tablet Take 1 tablet (40 mg total) by mouth daily. 03/18/19  Yes Cammie Sickle, MD  potassium chloride SA (K-DUR) 20 MEQ tablet Take 1 tablet (20 mEq total) by mouth daily as needed (if having nausea/vomiting- to supplements for lost potassium). 06/10/19  Yes Gladstone Lighter, MD  promethazine (PHENERGAN) 25 MG tablet Take 1 tablet (25 mg total) by mouth every 8 (eight) hours as needed for nausea or vomiting. 03/05/19  Yes Earlie Server, MD  rivaroxaban (XARELTO) 20 MG TABS tablet Take 1 tablet (20 mg total) by mouth daily with supper. Patient taking differently: Take 20 mg by mouth daily with supper. Takes in the morning 05/05/19  Yes Cammie Sickle, MD  famotidine (PEPCID) 20 MG tablet Take 1 tablet (20 mg total) by mouth 2 (two) times daily for 5 days. 05/23/19 05/28/19  Duffy Bruce, MD  feeding supplement, ENSURE ENLIVE, (ENSURE ENLIVE) LIQD Take 237 mLs by mouth 3 (three) times daily between meals. 05/22/19   Epifanio Lesches, MD  Multiple Vitamins-Minerals (MULTIVITAMIN WITH MINERALS) tablet Take 1 tablet by mouth daily. 08/01/18 08/01/19  Loletha Grayer, MD  VENTOLIN HFA 108 (90 Base) MCG/ACT inhaler Inhale 1-2 puffs into the lungs every 4 (four) hours as needed for shortness of breath. 02/19/19   Verlon Au, NP      VITAL SIGNS:  Blood pressure (!) 168/104, pulse 89, temperature 98.2 F (36.8 C), resp. rate (!) 27, height 5' 8"  (1.727 m), weight 75.8 kg, SpO2 98 %.  PHYSICAL EXAMINATION:  Physical Exam  GENERAL:  53 y.o.-year-old patient lying in the bed with no acute distress.  EYES: Pupils equal, round, reactive to light and accommodation. No scleral icterus. Extraocular muscles intact.  HEENT: Head atraumatic, normocephalic. Oropharynx and nasopharynx clear.  NECK:  Supple, no jugular venous distention. No thyroid enlargement, no tenderness.  LUNGS: Normal breath sounds bilaterally,  no wheezing, rales,rhonchi or crepitation. No use of accessory muscles of respiration.  CARDIOVASCULAR: Regular rate and rhythm, S1, S2 normal. No murmurs, rubs, or gallops.  ABDOMEN: Soft, with generalized tenderness mainly in the lower abdomen in the suprapubic area and right lower quadrant with no rebound tenderness, guarding or rigidity.  Bowel sounds present. No organomegaly or mass.  EXTREMITIES: No pedal edema, cyanosis, or clubbing.  NEUROLOGIC: Cranial nerves II through XII are intact. Muscle strength 5/5 in all extremities. Sensation intact. Gait not checked.  PSYCHIATRIC: The patient is alert and oriented x 3.  Normal affect and  good eye contact. SKIN: No obvious rash, lesion, or ulcer.   LABORATORY PANEL:   CBC Recent Labs  Lab 06/13/19 2232  WBC 7.0  HGB 11.4*  HCT 34.1*  PLT 184   ------------------------------------------------------------------------------------------------------------------  Chemistries  Recent Labs  Lab 06/13/19 2232  NA 139  K 2.2*  CL 108  CO2 20*  GLUCOSE 153*  BUN 10  CREATININE 0.61  CALCIUM 8.2*  MG 1.6*  AST 36  ALT 40  ALKPHOS 122  BILITOT 0.8   ------------------------------------------------------------------------------------------------------------------  Cardiac Enzymes No results for input(s): TROPONINI in the last 168 hours. ------------------------------------------------------------------------------------------------------------------  RADIOLOGY:  No results found.    IMPRESSION AND PLAN:   1.  Acute gastroenteritis.  The patient be admitted to a medical monitored bed.  She was placed on hydration with half-normal saline.  Will obtain stool studies.  She may need a repeat abdominal and pelvic CT scan in a.m. given right lower quadrant abdominal pain if it is not improving.  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  2.  Hypokalemia.  This like secondary to recurrent nausea and vomiting with diarrhea.  Will  replace potassium and follow levels.  3.  Hypomagnesemia.  Magnesium was replaced in the ER.  4.  Factor V Leiden mutation.  We will continue Xarelto.  5.  Metastatic cervical cancer.  Pain management will be provided.  6.  DVT prophylaxis.  We will continue Xarelto.    All the records are reviewed and case discussed with ED provider. The plan of care was discussed in details with the patient (and family). I answered all questions. The patient agreed to proceed with the above mentioned plan. Further management will depend upon hospital course.   CODE STATUS: Full code  TOTAL TIME TAKING CARE OF THIS PATIENT: 55 minutes.    Christel Mormon M.D on 06/14/2019 at 3:50 AM  Pager - [phone]  After 6pm go to [email or web address] - Proofreader  [facility]  Office  [phone]  CC: Primary care physician; [place]   Note: This dictation was prepared with Dragon dictation along with smaller phrase technology. Any transcriptional errors that result from this process are unintentional.

## 2019-06-14 NOTE — ED Notes (Signed)
Pt denies ability "to poop because I pooped so much"

## 2019-06-14 NOTE — ED Notes (Signed)
Pt ambulatory to toilet with steady gait noted.  

## 2019-06-14 NOTE — Progress Notes (Signed)
Patient admitted this am for persistent n/v/ diarrhea. 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 Was also d/c on hydrocotisone for low cortisol asked to f/u outpatient. Known metastatic cancer f/u oncology  Continue current plan as per admitting MD Add pharamcy to assist with lyte replacement and restart hydrocortisone.

## 2019-06-14 NOTE — ED Notes (Signed)
Pt given meal tray.

## 2019-06-14 NOTE — ED Notes (Signed)
Pt up to urinate independently.

## 2019-06-14 NOTE — Consult Note (Signed)
PHARMACY CONSULT NOTE - FOLLOW UP  Pharmacy Consult for Electrolyte Monitoring and Replacement   Recent Labs: Potassium (mmol/L)  Date Value  06/14/2019 3.1 (L)   Magnesium (mg/dL)  Date Value  06/13/2019 1.6 (L)   Calcium (mg/dL)  Date Value  06/14/2019 8.2 (L)   Albumin (g/dL)  Date Value  06/14/2019 2.9 (L)   Phosphorus (mg/dL)  Date Value  11/20/2018 3.7   Sodium (mmol/L)  Date Value  06/14/2019 139   Calculated Ca: 9.08 mg/dL Add-on magnesium: 1.8 mg/dL  Assessment: 53 y.o. Caucasian female with a known history of multiple medical problems and noted electrolyte abnormalities. Since admission overnight (prior to this morning's labs) she received 2 grams IV magnesium sulfate and 20 mEq IV KCl. Following morning lab draws she received an additional 120 mEq oral KCl  Goal of Therapy:  Electrolytes WNL  Plan:   Magnesium has been corrected: f/u lab in am  Add 20 mEq KCl to NS infusing at 100 mL/hr  Follow-up electrolyte labs in am  Pharmacy will continue to replace electrolytes as needed  Dallie Piles ,PharmD Clinical Pharmacist 06/14/2019 8:37 AM

## 2019-06-14 NOTE — ED Notes (Signed)
Pt resting in bed NAD. Pt reports pain 7/10 after pain medication. Pt denies any further needs at this time.

## 2019-06-14 NOTE — ED Provider Notes (Signed)
[facility] Emergency Department Provider Note  ____________________________________________  Time seen: Approximately 12:40 AM  I have reviewed the triage vital signs and the nursing notes.   HISTORY  Chief Complaint Chest Pain   HPI DELLE Hall is a 53 y.o. 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 difficile colitis who presents for evaluation of nausea, vomiting, diarrhea, and abdominal pain.  Patient admitted to the hospital this week for same.  CT showing chronic colitis.  Patient went home 3 days ago feeling better.  Symptoms started again today.  She has had several episodes of nonbloody nonbilious emesis and watery diarrhea.  Unable to keep anything down.  She is complaining of diffuse cramping severe and constant abdominal pain.  No fever or chills, no cough, chest pain, shortness of breath.   Past Medical History:  Diagnosis Date  . Abdominal pain 06/10/2018  . Abnormal cervical Papanicolaou smear 09/18/2017  . Anxiety   . Aortic atherosclerosis ([place])   . Arthritis    neck and knees  . 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 Blood transfusion without reported diagnosis   . Cervical cancer ([place]) 09/2016   mets lung  . Chronic anal fissure   . Chronic diarrhea   . Dyspnea   . Erosive gastropathy 09/18/2017  . Factor V Leiden mutation ([place])   . Fecal incontinence   . Genital warts   . GERD (gastroesophageal reflux disease)   . GI bleed 10/08/2018  . Heart murmur   . Hematochezia   . Hemorrhoids   . 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 Hepatitis, chronic ([place]) 05/05/2017  . History of cancer chemotherapy    completed 06/2017  . History of Clostridium difficile infection    while undergoing chemo.  Negative test 10/2017  . Ileocolic anastomotic leak   . Infarction of kidney ([place]) left kidney   and uterus   . Intestinal infection due to Clostridium difficile 09/18/2017  . Macrocytic anemia with vitamin B12 deficiency   . Multiple gastric ulcers   . Nausea vomiting and diarrhea   . Pancolitis ([place]) 07/27/2018  . Perianal condylomata   . Pneumonia    History of  . Pulmonary nodules   . Rectal bleeding   . Small bowel obstruction ([place]) 08/2017  . Stiff neck    limited right turn  . Vitamin D deficiency     Patient Active Problem List   Diagnosis Date Noted  . Acute gastroenteritis 06/14/2019  . Intractable nausea and vomiting 06/07/2019  . Chronic diastolic congestive heart failure ([place]) 05/30/2019  . Chest pain in adult 05/19/2019  . Intractable pain 05/19/2019  . Cancer associated pain   . Proctitis, radiation   . Palliative care encounter   . Encounter for monitoring opioid maintenance therapy 11/19/2018  . Adrenal insufficiency ([place]) 09/03/2018  . Chronic diarrhea 06/25/2018  . Chronic anticoagulation 06/25/2018  . Vomiting   . Encounter for antineoplastic chemotherapy 06/04/2018  . 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 Lung metastasis ([place]) 05/15/2018  . Closed fracture of distal end of radius 05/04/2018  . History of total knee arthroplasty 04/24/2018  . Osteoarthritis of left knee 02/28/2018  . Condyloma acuminatum of anus s/p ablation 02/22/2018 02/22/2018  . Condyloma acuminatum of vagina s/p ablation 02/22/2018 02/22/2018  . Positive ANA (antinuclear antibody) 02/04/2018  .  Aortic atherosclerosis ([place]) 01/15/2018  . Elevated MCV 01/15/2018  . Anemia 01/15/2018  . Genital warts 01/15/2018  . Vitamin D deficiency 01/15/2018  . Pernicious anemia   . Impingement syndrome of shoulder region 12/19/2017  . Multiple joint pain 12/19/2017  . Osteoarthritis of right knee 12/19/2017  . B12 deficiency 12/11/2017  . Lung nodules 12/11/2017  . History of Clostridium difficile infection 10/23/2017  . Factor V Leiden ([place]) 10/19/2017  . Goals of care, counseling/discussion  10/19/2017  . Cervical cancer, FIGO stage IB1 ([place]) 09/18/2017  . Chronic hepatitis C without hepatic coma ([place]) 09/18/2017  . Cytopenia 09/18/2017  . Diarrhea 09/18/2017  . Lower abdominal pain 09/18/2017  . 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 Malignant neoplasm of overlapping sites of cervix ([place]) 09/18/2017  . Renal insufficiency 09/18/2017  . Wound infection after surgery 09/12/2017  . Hypokalemia   . Hypomagnesemia   . Cervical arthritis 07/18/2017  . Dysuria 06/20/2017  . Metastatic cancer ([place]) 05/12/2017  . Essential hypertension 03/15/2017  . Anemia in other chronic diseases classified elsewhere 03/01/2017  . Chemotherapy-induced neutropenia ([place]) 01/29/2017  . 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 CHOLECYSTECTOMY    . COLON SURGERY  08/2017   resection  . COLONOSCOPY WITH PROPOFOL N/A 12/20/2017   Procedure: COLONOSCOPY WITH PROPOFOL;  Surgeon: [name], MD;  Location: [facility] ENDOSCOPY;  Service: Gastroenterology;  Laterality: N/A;  . COLONOSCOPY WITH PROPOFOL N/A 07/30/2018   Procedure: COLONOSCOPY WITH PROPOFOL;  Surgeon: [name], MD;  Location: [facility] ENDOSCOPY;  Service: Gastroenterology;  Laterality: N/A;  . COLONOSCOPY WITH PROPOFOL N/A 10/10/2018   Procedure: COLONOSCOPY WITH PROPOFOL;  Surgeon: Lucilla Lame, MD;  Location: [facility] ENDOSCOPY;  Service: Endoscopy;  Laterality: N/A;  . DIAGNOSTIC LAPAROSCOPY    . ESOPHAGOGASTRODUODENOSCOPY (EGD) WITH PROPOFOL N/A 12/20/2017   Procedure: ESOPHAGOGASTRODUODENOSCOPY (EGD) WITH PROPOFOL;  Surgeon: [name], MD;  Location: [place];  Service: Gastroenterology;  Laterality: N/A;  . ESOPHAGOGASTRODUODENOSCOPY (EGD) WITH PROPOFOL  07/30/2018   Procedure: ESOPHAGOGASTRODUODENOSCOPY (EGD) WITH PROPOFOL;  Surgeon: [name], MD;  Location: [facility] ENDOSCOPY;  Service: Gastroenterology;;  . Otho Darner SIGMOIDOSCOPY N/A 11/21/2018    Procedure: FLEXIBLE SIGMOIDOSCOPY;  Surgeon: [name], MD;  Location: [facility] ENDOSCOPY;  Service: Gastroenterology;  Laterality: N/A;  . LAPAROTOMY N/A 08/31/2017   Procedure: EXPLORATORY LAPAROTOMY for SBO, ileocolectomy, removal of piece of uterine wall;  Surgeon: [name], MD;  Location: [facility] ORS;  Service: General;  Laterality: N/A;  . LASER ABLATION CONDOLAMATA N/A 02/22/2018   Procedure: LASER ABLATION/REMOVAL OF [id number] AROUND ANUS AND VAGINA;  Surgeon: Michael Boston, MD;  Location: [place];  Service: General;  Laterality: N/A;  . OOPHORECTOMY    . PORTA CATH INSERTION N/A 05/13/2018   Procedure: PORTA CATH INSERTION;  Surgeon: Katha Cabal, MD;  Location: [place] CV LAB;  Service: Cardiovascular;  Laterality: N/A;  . SMALL INTESTINE SURGERY    . TANDEM RING INSERTION     x3  . THORACOTOMY    . TOTAL KNEE ARTHROPLASTY Left 04/24/2018   Procedure: TOTAL KNEE ARTHROPLASTY;  Surgeon: Lovell Sheehan, MD;  Location: [facility] ORS;  Service: Orthopedics;  Laterality: Left;    Prior to Admission medications   Medication Sig Start Date End Date Taking? Authorizing Provider  amitriptyline (ELAVIL) 75 MG tablet Take 1 tablet (75 mg total) by mouth at bedtime. 05/06/19 08/04/19 Yes Vanga, Tally Due, MD  Calcium Carb-Cholecalciferol (CALCIUM  500 +D) 500-400 MG-UNIT TABS Take 2 tablets by mouth daily.   Yes [provider]  diphenoxylate-atropine (LOMOTIL) 2.5-0.025 MG tablet TAKE (2) TABLETS FOUR TIMES DAILY. 04/21/19  Yes Vanga, Tally Due, MD  hydrocortisone (CORTEF) 20 MG tablet Take 1 tablet (20 mg total) by mouth daily. 06/10/19  Yes Gladstone Lighter, MD  hydrOXYzine (ATARAX/VISTARIL) 10 MG tablet Take 1 tablet (10 mg total) by mouth 3 (three) times daily as needed. 06/13/19  Yes Cammie Sickle, MD  hyoscyamine (LEVBID) 0.375 MG 12 hr tablet Take 1 tablet (0.375 mg total)by mouth 2 (two) times daily. 01/03/19  Yes Vanga, Tally Due, MD  ondansetron (ZOFRAN ODT) 4 MG disintegrating tablet Take 1 tablet (4 mg total) by mouth every 8 (eight) hours as needed for nausea or vomiting. 03/20/19  Yes Verlon Au, NP  oxyCODONE (OXYCONTIN) 15 mg 12 hr tablet Take 1 tablet (15 mg total) by mouth every 12 (twelve) hours. 05/29/19  Yes Cammie Sickle, MD  Oxycodone HCl 10 MG TABS Take 1 tablet (10 mg total) by mouth every 6 (six) hours as needed (for breakthrough pain). 06/06/19  Yes Jacquelin Hawking, NP  pantoprazole (PROTONIX) 40 MG tablet Take 1 tablet (40 mg total) by mouth daily. 03/18/19  Yes Cammie Sickle, MD  potassium chloride SA (K-DUR) 20 MEQ tablet Take 1 tablet (20 mEq total) by mouth daily as needed (if having nausea/vomiting- to supplements for lost potassium). 06/10/19  Yes Gladstone Lighter, MD  promethazine (PHENERGAN) 25 MG tablet Take 1 tablet (25 mg total) by mouth every 8 (eight) hours as needed for nausea or vomiting. 03/05/19  Yes Earlie Server, MD  rivaroxaban (XARELTO) 20 MG TABS tablet Take 1 tablet (20 mg total) by mouth daily with supper. Patient taking differently: Take 20 mg by mouth daily with supper. Takes in the morning 05/05/19  Yes Cammie Sickle, MD  famotidine (PEPCID) 20 MG tablet Take 1 tablet (20 mg total) by mouth 2 (two) times daily for 5 days. 05/23/19 05/28/19  Duffy Bruce, MD  feeding supplement, ENSURE ENLIVE, (ENSURE ENLIVE) LIQD Take 237 mLs by mouth 3 (three) times daily between meals. 05/22/19   Epifanio Lesches, MD  Multiple Vitamins-Minerals (MULTIVITAMIN WITH MINERALS) tablet Take 1 tablet by mouth daily. 08/01/18 08/01/19  Loletha Grayer, MD  VENTOLIN HFA 108 (90 Base) MCG/ACT inhaler Inhale 1-2 puffs into the lungs every 4 (four) hours as needed for shortness of breath. 02/19/19   Verlon Au, NP    Allergies Ketamine  Family History  Problem Relation Age of Onset  . Hypertension Father   . Diabetes Father   . Alcohol abuse Daughter   . Hypertension  Maternal Grandmother   . Diabetes Maternal Grandmother   . Diabetes Paternal Grandmother   . Hypertension Paternal Grandmother     Social History Social History   Tobacco Use  . Smoking status: Former Smoker    Quit date: 10/16/2006    Years since quitting: 12.6  . Smokeless tobacco: Never Used  Substance Use Topics  . Alcohol use: Not Currently    Frequency: Never    Comment: seldom  . Drug use: Yes    Types: Marijuana    Comment: not very often    Review of Systems  Constitutional: Negative for fever. Eyes: Negative for visual changes. ENT: Negative for sore throat. Neck: No neck pain  Cardiovascular: Negative for chest pain. Respiratory: Negative for shortness of breath. Gastrointestinal: + abdominal pain, vomiting, diarrhea. Genitourinary:  Negative for dysuria. Musculoskeletal: Negative for back pain. Skin: Negative for rash. Neurological: Negative for headaches, weakness or numbness. Psych: No SI or HI  ____________________________________________   PHYSICAL EXAM:  VITAL SIGNS: ED Triage Vitals  Enc Vitals Group     BP 06/13/19 2051 (!) 163/105     Pulse Rate 06/13/19 2051 96     Resp 06/13/19 2051 20     Temp 06/13/19 2051 97.7 F (36.5 C)     Temp Source 06/13/19 2051 Oral     SpO2 06/13/19 2051 96 %     Weight 06/13/19 2048 167 lb (75.8 kg)     Height 06/13/19 2048 5' 8"  (1.727 m)     Head Circumference --      Peak Flow --      Pain Score 06/13/19 2045 10     Pain Loc --      Pain Edu? --      Excl. in [place]? --     Constitutional: Alert and oriented, actively dry heaving.  HEENT:      Head: Normocephalic and atraumatic.         Eyes: Conjunctivae are normal. Sclera is non-icteric.       Mouth/Throat: Mucous membranes are dry.       Neck: Supple with no signs of meningismus. Cardiovascular: Regular rate and rhythm. No murmurs, gallops, or rubs. 2+ symmetrical distal pulses are present in all extremities. No JVD. Respiratory: Normal respiratory  effort. Lungs are clear to auscultation bilaterally. No wheezes, crackles, or rhonchi.  Gastrointestinal: Soft, mild diffuse tenderness to palaption, and non distended with positive bowel sounds. No rebound or guarding. Musculoskeletal: Nontender with normal range of motion in all extremities. No edema, cyanosis, or erythema of extremities. Neurologic: Normal speech and language. Face is symmetric. Moving all extremities. No gross focal neurologic deficits are appreciated. Skin: Skin is warm, dry and intact. No rash noted. Psychiatric: Mood and affect are normal. Speech and behavior are normal.  ____________________________________________   LABS (all labs ordered are listed, but only abnormal results are displayed)  Labs Reviewed  COMPREHENSIVE METABOLIC PANEL - Abnormal; Notable for the following components:      Result Value   Potassium 2.2 (*)    CO2 20 (*)    Glucose, Bld 153 (*)    Calcium 8.2 (*)    All other components within normal limits  CBC - Abnormal; Notable for the following components:   RBC 3.45 (*)    Hemoglobin 11.4 (*)    HCT 34.1 (*)    All other components within normal limits  URINALYSIS, COMPLETE (UACMP) WITH MICROSCOPIC - Abnormal; Notable for the following components:   Color, Urine YELLOW (*)    APPearance CLEAR (*)    Hgb urine dipstick SMALL (*)    Leukocytes,Ua MODERATE (*)    All other components within normal limits  MAGNESIUM - Abnormal; Notable for the following components:   Magnesium 1.6 (*)    All other components within normal limits  SARS CORONAVIRUS 2 (HOSPITAL ORDER, [place] [facility])  C DIFFICILE QUICK SCREEN W PCR REFLEX  GASTROINTESTINAL PANEL BY PCR, STOOL (REPLACES STOOL CULTURE)  LIPASE, BLOOD  MAGNESIUM  COMPREHENSIVE METABOLIC PANEL  CBC  POC URINE PREG, ED  POCT PREGNANCY, URINE  TROPONIN I (HIGH SENSITIVITY)  TROPONIN I (HIGH SENSITIVITY)   ____________________________________________  EKG  ED  ECG REPORT I, Rudene Re, the attending physician, personally viewed and interpreted this ECG.  Normal sinus rhythm,  rate of 91, normal intervals, normal axis, diffuse ST depressions with no ST elevations.  These depressions are new when compared to prior. ____________________________________________  RADIOLOGY  none  ____________________________________________   PROCEDURES  Procedure(s) performed: None Procedures Critical Care performed: yes  CRITICAL CARE Performed by: Rudene Re  ?  Total critical care time: 35 min  Critical care time was exclusive of separately billable procedures and treating other patients.  Critical care was necessary to treat or prevent imminent or life-threatening deterioration.  Critical care was time spent personally by me on the following activities: development of treatment plan with patient and/or surrogate as well as nursing, discussions with consultants, evaluation of patient's response to treatment, examination of patient, obtaining history from patient or surrogate, ordering and performing treatments and interventions, ordering and review of laboratory studies, ordering and review of radiographic studies, pulse oximetry and re-evaluation of patient's condition.  ____________________________________________   INITIAL IMPRESSION / ASSESSMENT AND PLAN / ED COURSE   53 y.o. 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 difficile colitis who presents for evaluation of nausea, vomiting, diarrhea, and abdominal pain.  Patient recently admitted with the same and a CT showing colitis which seems to be a chronic issue for patient due to radiation therapy.  Her symptoms restarted again today.  Patient is actively dry heaving, looks very dry on exam, vitals are within normal limits.  Abdomen is soft with no distention, positive bowel signs, mildly diffuse  tenderness throughout.  Labs showing significant hypokalemia with K of 2.2.  EKG shows diffuse ST depressions but normal intervals otherwise.  Will start repleting K IV.  We will send stool for C. difficile as this was not done on most recent admission.  Will give Dilaudid and Phenergan for symptom relief.  Will give IV fluids.  Will check magnesium. ddx acute on chronic abdominal pain, vomiting and diarrhea versus gastroenteritis versus C. difficile colitis versus colitis.     _________________________ 2:23 AM on 06/14/2019 -----------------------------------------  Patient also with hypomagnesemia which is being supplemented IV.  With persistent intractable nausea, vomiting, and abdominal pain requiring several rounds of IV antiemetics and narcotics.  Will admit to the hospitalist service.    As part of my medical decision making, I reviewed the following data within the [place] notes reviewed and incorporated, Labs reviewed , EKG interpreted , Old EKG reviewed, Old chart reviewed, Discussed with admitting physician , Notes from prior ED visits and  Controlled Substance Database   Patient was evaluated in Emergency Department today for the symptoms described in the history of present illness. Patient was evaluated in the context of the global COVID-19 pandemic, which necessitated consideration that the patient might be at risk for infection with the SARS-CoV-2 virus that causes COVID-19. Institutional protocols and algorithms that pertain to the evaluation of patients at risk for COVID-19 are in a state of rapid change based on information released by regulatory bodies including the CDC and federal and state organizations. These policies and algorithms were followed during the patient's care in the ED.   ____________________________________________   FINAL CLINICAL IMPRESSION(S) / ED DIAGNOSES   Final diagnoses:  Cancer associated pain  Nausea vomiting and  diarrhea  Hypokalemia  Hypomagnesemia      NEW MEDICATIONS STARTED DURING THIS VISIT:  ED Discharge Orders    None       Note:  This document was prepared using Dragon  voice recognition software and may include unintentional dictation errors.    Alfred Levins, [place], MD 06/14/19 [phone]

## 2019-06-14 NOTE — ED Notes (Signed)
ED TO INPATIENT HANDOFF REPORT  ED Nurse Name and Phone #: Janett Billow [phone]  S Name/Age/Gender Joanna Hall 53 y.o. female Room/Bed: ED07A/ED07A  Code Status   Code Status: Full Code  Home/SNF/Other Home Patient oriented to: self, place, time and situation Is this baseline? Yes   Triage Complete: Triage complete  Chief Complaint vomiting  Triage Note Patient states that she developed central chest pain, vomiting and diarrhea that started 2 hours ago. Patient states that she was just discharged from the hospital on Wednesday for the same. Patient states she has metastatic carcinoma and had her last immunotherapy treatment 2 weeks ago.    Allergies Allergies  Allergen Reactions  . Ketamine Anxiety and Other (See Comments)    Syncope episode/confusion     Level of Care/Admitting Diagnosis ED Disposition    ED Disposition Condition Comment   Admit  Hospital Area: [place] [[id number]]  Level of Care: Med-Surg [16]  Covid Evaluation: Confirmed COVID Negative  Diagnosis: Acute gastroenteritis [[id number]]  Admitting Physician: Christel Mormon [[id number]]  Attending Physician: Christel Mormon [[id number]]  Estimated length of stay: past midnight tomorrow  Certification:: I certify this patient will need inpatient services for at least 2 midnights  PT Class (Do Not Modify): Inpatient [101]  PT Acc Code (Do Not Modify): Private [1]       B Medical/Surgery History Past Medical History:  Diagnosis Date  . Abdominal pain 06/10/2018  . Abnormal cervical Papanicolaou smear 09/18/2017  . Anxiety   . Aortic atherosclerosis ([place])   . Arthritis    neck and knees  . 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 Blood transfusion without reported diagnosis   . Cervical cancer ([place]) 09/2016   mets lung  . Chronic anal fissure   . Chronic diarrhea   . Dyspnea   . Erosive gastropathy 09/18/2017  . Factor V Leiden mutation ([place])   . Fecal incontinence   .  Genital warts   . GERD (gastroesophageal reflux disease)   . GI bleed 10/08/2018  . Heart murmur   . Hematochezia   . Hemorrhoids   . 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 Hepatitis, chronic ([place]) 05/05/2017  . History of cancer chemotherapy    completed 06/2017  . History of Clostridium difficile infection    while undergoing chemo.  Negative test 10/2017  . Ileocolic anastomotic leak   . 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 Intestinal infection due to Clostridium difficile 09/18/2017  . Macrocytic anemia with vitamin B12 deficiency   . Multiple gastric ulcers   . Nausea vomiting and diarrhea   . Pancolitis ([place]) 07/27/2018  . Perianal condylomata   . Pneumonia    History of  . Pulmonary nodules   . Rectal bleeding   . Small bowel obstruction ([place]) 08/2017  . Stiff neck    limited right turn  . Vitamin D deficiency    Past Surgical History:  Procedure Laterality Date  . CHOLECYSTECTOMY    . COLON SURGERY  08/2017   resection  . COLONOSCOPY WITH PROPOFOL N/A 12/20/2017   Procedure: COLONOSCOPY WITH PROPOFOL;  Surgeon: [name], MD;  Location: [facility] ENDOSCOPY;  Service: Gastroenterology;  Laterality: N/A;  . COLONOSCOPY WITH PROPOFOL N/A 07/30/2018   Procedure: COLONOSCOPY WITH PROPOFOL;  Surgeon: [name], MD;  Location[facility] ENDOSCOPY;  Service: Gastroenterology;  Laterality:  N/A;  . COLONOSCOPY WITH PROPOFOL N/A 10/10/2018   Procedure: COLONOSCOPY WITH PROPOFOL;  Surgeon: Lucilla Lame, MD;  Location: [facility] ENDOSCOPY;  Service: Endoscopy;  Laterality: N/A;  . DIAGNOSTIC LAPAROSCOPY    . ESOPHAGOGASTRODUODENOSCOPY (EGD) WITH PROPOFOL N/A 12/20/2017   Procedure: ESOPHAGOGASTRODUODENOSCOPY (EGD) WITH PROPOFOL;  Surgeon: [name], MD;  Location: [place];  Service: Gastroenterology;  Laterality: N/A;  . ESOPHAGOGASTRODUODENOSCOPY (EGD) WITH PROPOFOL  07/30/2018   Procedure: ESOPHAGOGASTRODUODENOSCOPY  (EGD) WITH PROPOFOL;  Surgeon: [name], MD;  Location: [facility] ENDOSCOPY;  Service: Gastroenterology;;  . Otho Darner SIGMOIDOSCOPY N/A 11/21/2018   Procedure: FLEXIBLE SIGMOIDOSCOPY;  Surgeon: [name], MD;  Location: [facility] ENDOSCOPY;  Service: Gastroenterology;  Laterality: N/A;  . LAPAROTOMY N/A 08/31/2017   Procedure: EXPLORATORY LAPAROTOMY for SBO, ileocolectomy, removal of piece of uterine wall;  Surgeon: [name], MD;  Location: [facility] ORS;  Service: General;  Laterality: N/A;  . LASER ABLATION CONDOLAMATA N/A 02/22/2018   Procedure: LASER ABLATION/REMOVAL OF TKZSWFUXNAT AROUND ANUS AND VAGINA;  Surgeon: Michael Boston, MD;  Location: [place];  Service: General;  Laterality: N/A;  . OOPHORECTOMY    . PORTA CATH INSERTION N/A 05/13/2018   Procedure: PORTA CATH INSERTION;  Surgeon: Katha Cabal, MD;  Location: [facility] CV LAB;  Service: Cardiovascular;  Laterality: N/A;  . SMALL INTESTINE SURGERY    . TANDEM RING INSERTION     x3  . THORACOTOMY    . TOTAL KNEE ARTHROPLASTY Left 04/24/2018   Procedure: TOTAL KNEE ARTHROPLASTY;  Surgeon: Lovell Sheehan, MD;  Location: [facility] ORS;  Service: Orthopedics;  Laterality: Left;     A IV Location/Drains/Wounds Patient Lines/Drains/Airways Status   Active Line/Drains/Airways    Name:   Placement date:   Placement time:   Site:   Days:   Implanted Port 05/29/19   05/29/19    0846    -   16          Intake/Output Last 24 hours  Intake/Output Summary (Last 24 hours) at 06/14/2019 1333 Last data filed at 06/14/2019 0731 Gross per 24 hour  Intake 2141.58 ml  Output -  Net 2141.58 ml    Labs/Imaging Results for orders placed or performed during the hospital encounter of 06/14/19 (from the past 48 hour(s))  Lipase, blood     Status: None   Collection Time: 06/13/19 10:32 PM  Result Value Ref Range   Lipase 28 11 - 51 U/L    Comment: Performed at [facility], [address]  [place], [place] [address]  Comprehensive metabolic panel     Status: Abnormal   Collection Time: 06/13/19 10:32 PM  Result Value Ref Range   Sodium 139 135 - 145 mmol/L   Potassium 2.2 (LL) 3.5 - 5.1 mmol/L    Comment: CRITICAL RESULT CALLED TO, READ BACK BY AND VERIFIED WITH MICHELE MORTON AT 2341 ON 06/13/19 RWW    Chloride 108 98 - 111 mmol/L   CO2 20 (L) 22 - 32 mmol/L   Glucose, Bld 153 (H) 70 - 99 mg/dL   BUN 10 6 - 20 mg/dL   Creatinine, Ser 0.61 0.44 - 1.00 mg/dL   Calcium 8.2 (L) 8.9 - 10.3 mg/dL   Total Protein 6.7 6.5 - 8.1 g/dL   Albumin 3.5 3.5 - 5.0 g/dL   AST 36 15 - 41 U/L   ALT 40 0 - 44 U/L   Alkaline Phosphatase 122 38 - 126 U/L   Total Bilirubin 0.8  0.3 - 1.2 mg/dL   GFR calc non Af Amer >60 >60 mL/min   GFR calc Af Amer >60 >60 mL/min   Anion gap 11 5 - 15    Comment: Performed at [facility], [address] [place], [place] [address]  CBC     Status: Abnormal   Collection Time: 06/13/19 10:32 PM  Result Value Ref Range   WBC 7.0 4.0 - 10.5 K/uL   RBC 3.45 (L) 3.87 - 5.11 MIL/uL   Hemoglobin 11.4 (L) 12.0 - 15.0 g/dL   HCT 34.1 (L) 36.0 - 46.0 %   MCV 98.8 80.0 - 100.0 fL   MCH 33.0 26.0 - 34.0 pg   MCHC 33.4 30.0 - 36.0 g/dL   RDW 13.6 11.5 - 15.5 %   Platelets 184 150 - 400 K/uL   nRBC 0.0 0.0 - 0.2 %    Comment: Performed at [facility], [place], [place] [address]  Troponin I (High Sensitivity)     Status: None   Collection Time: 06/13/19 10:32 PM  Result Value Ref Range   Troponin I (High Sensitivity) 4 <18 ng/L    Comment: (NOTE) Elevated high sensitivity troponin I (hsTnI) values and significant  changes across serial measurements may suggest ACS but many other  chronic and acute conditions are known to elevate hsTnI results.  Refer to the "Links" section for chest pain algorithms and additional  guidance. Performed at [facility], [address] [place], [place] [address]   Magnesium      Status: Abnormal   Collection Time: 06/13/19 10:32 PM  Result Value Ref Range   Magnesium 1.6 (L) 1.7 - 2.4 mg/dL    Comment: Performed at [facility], [address] [place], [place] [address]  SARS Coronavirus 2 [facility] order, Performed in [facility] hospital lab) Nasopharyngeal Nasopharyngeal Swab     Status: None   Collection Time: 06/14/19  1:33 AM   Specimen: Nasopharyngeal Swab  Result Value Ref Range   SARS Coronavirus 2 NEGATIVE NEGATIVE    Comment: (NOTE) If result is NEGATIVE SARS-CoV-2 target nucleic acids are NOT DETECTED. The SARS-CoV-2 RNA is generally detectable in upper and lower  respiratory specimens during the acute phase of infection. The lowest  concentration of SARS-CoV-2 viral copies this assay can detect is 250  copies / mL. A negative result does not preclude SARS-CoV-2 infection  and should not be used as the sole basis for treatment or other  patient management decisions.  A negative result may occur with  improper specimen collection / handling, submission of specimen other  than nasopharyngeal swab, presence of viral mutation(s) within the  areas targeted by this assay, and inadequate number of viral copies  (<250 copies / mL). A negative result must be combined with clinical  observations, patient history, and epidemiological information. If result is POSITIVE SARS-CoV-2 target nucleic acids are DETECTED. The SARS-CoV-2 RNA is generally detectable in upper and lower  respiratory specimens dur ing the acute phase of infection.  Positive  results are indicative of active infection with SARS-CoV-2.  Clinical  correlation with patient history and other diagnostic information is  necessary to determine patient infection status.  Positive results do  not rule out bacterial infection or co-infection with other viruses. If result is PRESUMPTIVE POSTIVE SARS-CoV-2 nucleic acids MAY BE PRESENT.   A presumptive positive result was obtained on the  submitted specimen  and confirmed on repeat testing.  While 2019 novel  coronavirus  (SARS-CoV-2) nucleic acids may be present in the submitted sample  additional confirmatory testing may be necessary for epidemiological  and / or clinical management purposes  to differentiate between  SARS-CoV-2 and other Sarbecovirus currently known to infect humans.  If clinically indicated additional testing with an alternate test  methodology [id number]) is advised. The SARS-CoV-2 RNA is generally  detectable in upper and lower respiratory sp ecimens during the acute  phase of infection. The expected result is Negative. Fact Sheet for Patients:  StrictlyIdeas.no Fact Sheet for Healthcare Providers: BankingDealers.co.za This test is not yet approved or cleared by the [place] FDA and has been authorized for detection and/or diagnosis of SARS-CoV-2 by FDA under an Emergency Use Authorization (EUA).  This EUA will remain in effect (meaning this test can be used) for the duration of the COVID-19 declaration under Section 564(b)(1) of the Act, 21 U.S.C. section 360bbb-3(b)(1), unless the authorization is terminated or revoked sooner. Performed at [facility], [address] [place], [place] [address]   Urinalysis, Complete w Microscopic     Status: Abnormal   Collection Time: 06/14/19  2:39 AM  Result Value Ref Range   Color, Urine YELLOW (A) YELLOW   APPearance CLEAR (A) CLEAR   Specific Gravity, Urine 1.013 1.005 - 1.030   pH 5.0 5.0 - 8.0   Glucose, UA NEGATIVE NEGATIVE mg/dL   Hgb urine dipstick SMALL (A) NEGATIVE   Bilirubin Urine NEGATIVE NEGATIVE   Ketones, ur NEGATIVE NEGATIVE mg/dL   Protein, ur NEGATIVE NEGATIVE mg/dL   Nitrite NEGATIVE NEGATIVE   Leukocytes,Ua MODERATE (A) NEGATIVE   RBC / HPF 0-5 0 - 5 RBC/hpf   WBC, UA 0-5 0 - 5 WBC/hpf   Bacteria, UA NONE SEEN NONE SEEN   Squamous Epithelial / LPF 0-5 0 - 5     Comment: Performed at [facility], [address] [place], [place] [address]  Pregnancy, urine POC     Status: None   Collection Time: 06/14/19  2:45 AM  Result Value Ref Range   Preg Test, Ur NEGATIVE NEGATIVE    Comment:        THE SENSITIVITY OF THIS METHODOLOGY IS >24 mIU/mL   Troponin I (High Sensitivity)     Status: None   Collection Time: 06/14/19  5:47 AM  Result Value Ref Range   Troponin I (High Sensitivity) 7 <18 ng/L    Comment: (NOTE) Elevated high sensitivity troponin I (hsTnI) values and significant  changes across serial measurements may suggest ACS but many other  chronic and acute conditions are known to elevate hsTnI results.  Refer to the "Links" section for chest pain algorithms and additional  guidance. Performed at [facility], [address] [place],  [address]   Comprehensive metabolic panel     Status: Abnormal   Collection Time: 06/14/19  5:47 AM  Result Value Ref Range   Sodium 139 135 - 145 mmol/L   Potassium 3.1 (L) 3.5 - 5.1 mmol/L   Chloride 107 98 - 111 mmol/L   CO2 25 22 - 32 mmol/L   Glucose, Bld 95 70 - 99 mg/dL   BUN 8 6 - 20 mg/dL   Creatinine, Ser 0.58 0.44 - 1.00 mg/dL   Calcium 8.2 (L) 8.9 - 10.3 mg/dL   Total Protein 5.9 (L) 6.5 - 8.1 g/dL   Albumin 2.9 (L) 3.5 - 5.0 g/dL   AST 38 15 - 41 U/L   ALT 40 0 -  44 U/L   Alkaline Phosphatase 97 38 - 126 U/L   Total Bilirubin 0.5 0.3 - 1.2 mg/dL   GFR calc non Af Amer >60 >60 mL/min   GFR calc Af Amer >60 >60 mL/min   Anion gap 7 5 - 15    Comment: Performed at [facility], [address] [place], [place] [address]  CBC     Status: Abnormal   Collection Time: 06/14/19  5:47 AM  Result Value Ref Range   WBC 4.7 4.0 - 10.5 K/uL   RBC 2.90 (L) 3.87 - 5.11 MIL/uL   Hemoglobin 9.6 (L) 12.0 - 15.0 g/dL   HCT 29.3 (L) 36.0 - 46.0 %   MCV 101.0 (H) 80.0 - 100.0 fL   MCH 33.1 26.0 - 34.0 pg   MCHC 32.8 30.0 - 36.0 g/dL   RDW 13.5 11.5 - 15.5 %    Platelets 134 (L) 150 - 400 K/uL   nRBC 0.0 0.0 - 0.2 %    Comment: Performed at [facility], [address] [place], [place] [address]  Magnesium     Status: None   Collection Time: 06/14/19  5:47 AM  Result Value Ref Range   Magnesium 1.8 1.7 - 2.4 mg/dL    Comment: Performed at [facility], [address], [place], [place] [address]   *Note: Due to a large number of results and/or encounters for the requested time period, some results have not been displayed. A complete set of results can be found in Results Review.   No results found.  Pending Labs Unresulted Labs (From admission, onward)    Start     Ordered   06/15/19 9470  Basic metabolic panel  Tomorrow morning,   STAT     06/14/19 0834   06/15/19 0500  Magnesium  Tomorrow morning,   STAT     06/14/19 0954   06/14/19 0418  Gastrointestinal Panel by PCR , Stool  (Gastrointestinal Panel by PCR, Stool)  Once,   STAT     06/14/19 0418   06/14/19 0040  C difficile quick scan w PCR reflex  (C Difficile quick screen w PCR reflex panel)  Once, for 24 hours,   STAT     06/14/19 0039          Vitals/Pain Today's Vitals   06/14/19 1024 06/14/19 1031 06/14/19 1100 06/14/19 1130  BP: 118/81 115/78 113/80 111/77  Pulse: 88 84 82 85  Resp: 17  18 17   Temp: 98.3 F (36.8 C)     TempSrc: Oral     SpO2: 100% 98% 99% 98%  Weight:      Height:      PainSc:        Isolation Precautions Enteric precautions (UV disinfection)  Medications Medications  multivitamin with minerals tablet 1 tablet (1 tablet Oral Given 06/14/19 0944)  albuterol (PROVENTIL) (2.5 MG/3ML) 0.083% nebulizer solution 2.5-5 mg (has no administration in time range)  feeding supplement (ENSURE ENLIVE) (ENSURE ENLIVE) liquid 237 mL (237 mLs Oral Given 06/14/19 1239)  oxyCODONE (OXYCONTIN) 12 hr tablet 15 mg (15 mg Oral Given 06/14/19 0944)  oxyCODONE (Oxy IR/ROXICODONE) immediate release tablet 10 mg (10 mg Oral Given 06/14/19 0827)   amitriptyline (ELAVIL) tablet 75 mg (has no administration in time range)  hydrOXYzine (ATARAX/VISTARIL) tablet 10 mg (has no administration in time range)  famotidine (PEPCID) tablet 20 mg (20 mg Oral Given 06/14/19 0944)  hyoscyamine (LEVBID) 0.375 MG 12 hr tablet 0.375 mg (  has no administration in time range)  rivaroxaban (XARELTO) tablet 20 mg (has no administration in time range)  calcium-vitamin D (OSCAL WITH D) 500-200 MG-UNIT per tablet 2 tablet (2 tablets Oral Given 06/14/19 0944)  promethazine (PHENERGAN) tablet 25 mg (has no administration in time range)  pantoprazole (PROTONIX) injection 40 mg (40 mg Intravenous Given 06/14/19 0945)  acetaminophen (TYLENOL) tablet 650 mg (has no administration in time range)    Or  acetaminophen (TYLENOL) suppository 650 mg (has no administration in time range)  traZODone (DESYREL) tablet 25 mg (has no administration in time range)  ondansetron (ZOFRAN) tablet 4 mg ( Oral See Alternative 06/14/19 1021)    Or  ondansetron (ZOFRAN) injection 4 mg (4 mg Intravenous Given 06/14/19 1021)  morphine 2 MG/ML injection 1-2 mg (2 mg Intravenous Given 06/14/19 0600)  hydrocortisone (CORTEF) tablet 20 mg (20 mg Oral Given 06/14/19 1312)  ondansetron (ZOFRAN-ODT) disintegrating tablet 4 mg (has no administration in time range)  0.9 % NaCl with KCl 20 mEq/ L  infusion (has no administration in time range)  promethazine (PHENERGAN) injection 12.5 mg (12.5 mg Intravenous Given 06/13/19 2230)  sodium chloride 0.9 % bolus 1,000 mL (0 mLs Intravenous Stopped 06/14/19 0123)  potassium chloride 10 mEq in 100 mL IVPB (0 mEq Intravenous Stopped 06/14/19 0326)  promethazine (PHENERGAN) injection 25 mg (25 mg Intravenous Given 06/14/19 0112)  sodium chloride 0.9 % bolus 1,000 mL (0 mLs Intravenous Stopped 06/14/19 0424)  HYDROmorphone (DILAUDID) injection 1 mg (1 mg Intravenous Given 06/14/19 0112)  magnesium sulfate IVPB 2 g 50 mL (0 g Intravenous Stopped 06/14/19 0336)   HYDROmorphone (DILAUDID) injection 1 mg (1 mg Intravenous Given 06/14/19 0233)  potassium chloride SA (K-DUR) CR tablet 20 mEq (20 mEq Oral Given 06/14/19 0618)  potassium chloride (KLOR-CON) packet 40 mEq (40 mEq Oral Given 06/14/19 0558)  ondansetron (ZOFRAN) injection 4 mg (4 mg Intravenous Given 06/14/19 1322)    Mobility walks     Focused Assessments N/v/d r/t cancer and cancer pain   R Recommendations: See Admitting Provider Note  Report given to:   Additional Notes:

## 2019-06-14 NOTE — ED Notes (Signed)
Pt given small cup of ice chips

## 2019-06-14 NOTE — ED Notes (Signed)
Patient c/o nausea. Dr. Kerman Passey aware.

## 2019-06-14 NOTE — ED Notes (Addendum)
Call bell light answered, pt gagging herself with fingers reports that makes her feel better but "there's nothing to come up"  Pt requesting pain meds and mouth swabs; denies using ice chips

## 2019-06-14 NOTE — ED Notes (Signed)
Pt given sprite to drink.

## 2019-06-15 LAB — BASIC METABOLIC PANEL
Anion gap: 7 (ref 5–15)
BUN: 8 mg/dL (ref 6–20)
CO2: 27 mmol/L (ref 22–32)
Calcium: 8.9 mg/dL (ref 8.9–10.3)
Chloride: 105 mmol/L (ref 98–111)
Creatinine, Ser: 0.72 mg/dL (ref 0.44–1.00)
GFR calc Af Amer: >60 mL/min (ref >60)
GFR calc non Af Amer: >60 mL/min (ref >60)
Glucose, Bld: 77 mg/dL (ref 70–99)
Potassium: 4.3 mmol/L (ref 3.5–5.1)
Sodium: 139 mmol/L (ref 135–145)

## 2019-06-15 LAB — MAGNESIUM: Magnesium: 1.4 mg/dL — ABNORMAL LOW (ref 1.7–2.4)

## 2019-06-15 MED ORDER — MAGNESIUM SULFATE 2 GM/50ML IV SOLN
2.0000 g | Freq: Once | INTRAVENOUS | Status: AC
  Administered 2019-06-15: 2 g via INTRAVENOUS
  Filled 2019-06-15: qty 50

## 2019-06-15 NOTE — Consult Note (Signed)
PHARMACY CONSULT NOTE - FOLLOW UP  Pharmacy Consult for Electrolyte Monitoring and Replacement   Recent Labs: Potassium (mmol/L)  Date Value  06/15/2019 4.3   Magnesium (mg/dL)  Date Value  06/15/2019 1.4 (L)   Calcium (mg/dL)  Date Value  06/15/2019 8.9   Albumin (g/dL)  Date Value  06/14/2019 2.9 (L)   Phosphorus (mg/dL)  Date Value  11/20/2018 3.7   Sodium (mmol/L)  Date Value  06/15/2019 139     Assessment: 53 y.o. Caucasian female with a known history of multiple medical problems and noted electrolyte abnormalities. Since admission overnight (prior to this morning's labs) she received 2 grams IV magnesium sulfate and 20 mEq IV KCl. Following morning lab draws she received an additional 120 mEq oral KCl  Goal of Therapy:  Electrolytes WNL  Plan:  K 4.3 Mag 1.4 Scr 0.72  Will order Magnesium 2 gm IV x1  Patient on NS w/ KCL 20 meq infusing at 100 mL/hr  Follow-up electrolyte labs in am  Pharmacy will continue to replace electrolytes as needed  Noralee Space ,PharmD Clinical Pharmacist 06/15/2019 9:09 AM

## 2019-06-15 NOTE — Discharge Summary (Signed)
[facility] NAME: Joanna Hall    MR#:  [id number]  DATE OF BIRTH:  04/13/1966  DATE OF ADMISSION:  06/14/2019 ADMITTING PHYSICIAN: Christel Mormon, MD  DATE OF DISCHARGE: 06/15/2019  PRIMARY CARE PHYSICIAN: [facility], [place]    ADMISSION DIAGNOSIS:  Hypokalemia [E87.6] Hypomagnesemia [E83.42] Cancer associated pain [G89.3] Nausea vomiting and diarrhea [R11.2, R19.7]  DISCHARGE DIAGNOSIS:  Active Problems:   Acute gastroenteritis   SECONDARY DIAGNOSIS:   Past Medical History:  Diagnosis Date  . Abdominal pain 06/10/2018  . Abnormal cervical Papanicolaou smear 09/18/2017  . Anxiety   . Aortic atherosclerosis ([place])   . Arthritis    neck and knees  . 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 Blood transfusion without reported diagnosis   . Cervical cancer ([place]) 09/2016   mets lung  . Chronic anal fissure   . Chronic diarrhea   . Dyspnea   . Erosive gastropathy 09/18/2017  . Factor V Leiden mutation ([place])   . Fecal incontinence   . Genital warts   . GERD (gastroesophageal reflux disease)   . GI bleed 10/08/2018  . Heart murmur   . Hematochezia   . Hemorrhoids   . 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 Hepatitis, chronic ([place]) 05/05/2017  . History of cancer chemotherapy    completed 06/2017  . History of Clostridium difficile infection    while undergoing chemo.  Negative test 10/2017  . Ileocolic anastomotic leak   . 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 Intestinal infection due to Clostridium difficile 09/18/2017  . Macrocytic anemia with vitamin B12 deficiency   . Multiple gastric ulcers   . Nausea vomiting and diarrhea   . Pancolitis ([place]) 07/27/2018  . Perianal condylomata   . Pneumonia    History of  . Pulmonary nodules   . Rectal bleeding   . Small bowel obstruction ([place]) 08/2017  . Stiff neck    limited right turn  . 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  1.  Diarrhea : All of her symptoms have subsided.  Patient has a history of radiation colitis.  She was most recently hospitalized 4 days ago for similar symptoms.  She also admits to chronic marijuana use which could be contributing to her nausea and vomiting.  Her chronic diarrhea is from radiation colitis.  C. difficile panel and GI panel again were negative.    2.  Metastatic cervical cancer status post radiation chemotherapy (patient stopped chemotherapy due to intolerance) She has follow-up on Thursday with Dr. per Monday  3.  DVT/PE with factor V Leiden: Continue Xarelto  4.  Electrolyte abnormalities: These were repleted  5.  Adrenal insufficiency: Patient will continue hydrocortisone.  She has outpatient follow-up with endocrinology she reports in December.     DISCHARGE CONDITIONS AND DIET:  Stable for discharge regular diet   CONSULTS OBTAINED:    DRUG ALLERGIES:   Allergies  Allergen Reactions  . Ketamine Anxiety and Other (See Comments)    Syncope episode/confusion     DISCHARGE MEDICATIONS:   Allergies as of 06/15/2019  Reactions   Ketamine Anxiety, Other (See Comments)   Syncope episode/confusion      Medication List    TAKE these medications   amitriptyline 75 MG tablet Commonly known as: ELAVIL Take 1 tablet (75 mg total) by mouth at bedtime.   Calcium 500 +D 500-400 MG-UNIT Tabs Generic drug: Calcium Carb-Cholecalciferol Take 2 tablets by mouth daily.   diphenoxylate-atropine 2.5-0.025 MG tablet Commonly known as: LOMOTIL TAKE (2) TABLETS FOUR TIMES DAILY.   famotidine 20 MG tablet Commonly known as: PEPCID Take 1 tablet (20 mg total) by mouth 2 (two) times daily for  5 days.   feeding supplement (ENSURE ENLIVE) Liqd Take 237 mLs by mouth 3 (three) times daily between meals.   hydrocortisone 20 MG tablet Commonly known as: CORTEF Take 1 tablet (20 mg total) by mouth daily.   hydrOXYzine 10 MG tablet Commonly known as: ATARAX/VISTARIL Take 1 tablet (10 mg total) by mouth 3 (three) times daily as needed.   hyoscyamine 0.375 MG 12 hr tablet Commonly known as: LEVBID Take 1 tablet (0.375 mg total)by mouth 2 (two) times daily.   multivitamin with minerals tablet Take 1 tablet by mouth daily.   ondansetron 4 MG disintegrating tablet Commonly known as: Zofran ODT Take 1 tablet (4 mg total) by mouth every 8 (eight) hours as needed for nausea or vomiting.   oxyCODONE 15 mg 12 hr tablet Commonly known as: OXYCONTIN Take 1 tablet (15 mg total) by mouth every 12 (twelve) hours.   Oxycodone HCl 10 MG Tabs Take 1 tablet (10 mg total) by mouth every 6 (six) hours as needed (for breakthrough pain).   pantoprazole 40 MG tablet Commonly known as: PROTONIX Take 1 tablet (40 mg total) by mouth daily.   potassium chloride SA 20 MEQ tablet Commonly known as: K-DUR Take 1 tablet (20 mEq total) by mouth daily as needed (if having nausea/vomiting- to supplements for lost potassium).   promethazine 25 MG tablet Commonly known as: PHENERGAN Take 1 tablet (25 mg total) by mouth every 8 (eight) hours as needed for nausea or vomiting.   rivaroxaban 20 MG Tabs tablet Commonly known as: XARELTO Take 1 tablet (20 mg total) by mouth daily with supper. What changed: additional instructions   Ventolin HFA 108 (90 Base) MCG/ACT inhaler Generic drug: albuterol Inhale 1-2 puffs into the lungs every 4 (four) hours as needed for shortness of breath.         Today   CHIEF COMPLAINT:  Patient reports feeling much better.  She would like to go home.   VITAL SIGNS:  Blood pressure 131/80, pulse 66, temperature 98 F (36.7 C), temperature source Oral, resp.  rate 18, height 5' 8"  (1.727 m), weight 75.8 kg, SpO2 100 %.   REVIEW OF SYSTEMS:  Review of Systems  Constitutional: Negative.  Negative for chills, fever and malaise/fatigue.  HENT: Negative.  Negative for ear discharge, ear pain, hearing loss, nosebleeds and sore throat.   Eyes: Negative.  Negative for blurred vision and pain.  Respiratory: Negative.  Negative for cough, hemoptysis, shortness of breath and wheezing.   Cardiovascular: Negative.  Negative for chest pain, palpitations and leg swelling.  Gastrointestinal: Negative.  Negative for abdominal pain, blood in stool, diarrhea (improved), nausea and vomiting.  Genitourinary: Negative.  Negative for dysuria.  Musculoskeletal: Negative.  Negative for back pain.  Skin: Negative.   Neurological: Negative for dizziness, tremors, speech change, focal weakness, seizures and headaches.  Endo/Heme/Allergies: Negative.  Does not bruise/bleed easily.  Psychiatric/Behavioral:  Negative.  Negative for depression, hallucinations and suicidal ideas.     PHYSICAL EXAMINATION:  GENERAL:  53 y.o.-year-old patient lying in the bed with no acute distress.  NECK:  Supple, no jugular venous distention. No thyroid enlargement, no tenderness.  LUNGS: Normal breath sounds bilaterally, no wheezing, rales,rhonchi  No use of accessory muscles of respiration.  CARDIOVASCULAR: S1, S2 normal. No murmurs, rubs, or gallops.  ABDOMEN: Soft, non-tender, non-distended. Bowel sounds present. No organomegaly or mass.  EXTREMITIES: No pedal edema, cyanosis, or clubbing.  PSYCHIATRIC: The patient is alert and oriented x 3.  SKIN: No obvious rash, lesion, or ulcer.   DATA REVIEW:   CBC Recent Labs  Lab 06/14/19 0547  WBC 4.7  HGB 9.6*  HCT 29.3*  PLT 134*    Chemistries  Recent Labs  Lab 06/14/19 0547 06/15/19 0700  NA 139 139  K 3.1* 4.3  CL 107 105  CO2 25 27  GLUCOSE 95 77  BUN 8 8  CREATININE 0.58 0.72  CALCIUM 8.2* 8.9  MG 1.8 1.4*  AST  38  --   ALT 40  --   ALKPHOS 97  --   BILITOT 0.5  --     Cardiac Enzymes No results for input(s): TROPONINI in the last 168 hours.  Microbiology Results  @MICRORSLT48 @  RADIOLOGY:  No results found.    Allergies as of 06/15/2019      Reactions   Ketamine Anxiety, Other (See Comments)   Syncope episode/confusion      Medication List    TAKE these medications   amitriptyline 75 MG tablet Commonly known as: ELAVIL Take 1 tablet (75 mg total) by mouth at bedtime.   Calcium 500 +D 500-400 MG-UNIT Tabs Generic drug: Calcium Carb-Cholecalciferol Take 2 tablets by mouth daily.   diphenoxylate-atropine 2.5-0.025 MG tablet Commonly known as: LOMOTIL TAKE (2) TABLETS FOUR TIMES DAILY.   famotidine 20 MG tablet Commonly known as: PEPCID Take 1 tablet (20 mg total) by mouth 2 (two) times daily for 5 days.   feeding supplement (ENSURE ENLIVE) Liqd Take 237 mLs by mouth 3 (three) times daily between meals.   hydrocortisone 20 MG tablet Commonly known as: CORTEF Take 1 tablet (20 mg total) by mouth daily.   hydrOXYzine 10 MG tablet Commonly known as: ATARAX/VISTARIL Take 1 tablet (10 mg total) by mouth 3 (three) times daily as needed.   hyoscyamine 0.375 MG 12 hr tablet Commonly known as: LEVBID Take 1 tablet (0.375 mg total)by mouth 2 (two) times daily.   multivitamin with minerals tablet Take 1 tablet by mouth daily.   ondansetron 4 MG disintegrating tablet Commonly known as: Zofran ODT Take 1 tablet (4 mg total) by mouth every 8 (eight) hours as needed for nausea or vomiting.   oxyCODONE 15 mg 12 hr tablet Commonly known as: OXYCONTIN Take 1 tablet (15 mg total) by mouth every 12 (twelve) hours.   Oxycodone HCl 10 MG Tabs Take 1 tablet (10 mg total) by mouth every 6 (six) hours as needed (for breakthrough pain).   pantoprazole 40 MG tablet Commonly known as: PROTONIX Take 1 tablet (40 mg total) by mouth daily.   potassium chloride SA 20 MEQ  tablet Commonly known as: K-DUR Take 1 tablet (20 mEq total) by mouth daily as needed (if having nausea/vomiting- to supplements for lost potassium).   promethazine 25 MG tablet Commonly known as: PHENERGAN Take 1 tablet (25 mg total) by mouth every 8 (eight) hours as needed for nausea or  vomiting.   rivaroxaban 20 MG Tabs tablet Commonly known as: XARELTO Take 1 tablet (20 mg total) by mouth daily with supper. What changed: additional instructions   Ventolin HFA 108 (90 Base) MCG/ACT inhaler Generic drug: albuterol Inhale 1-2 puffs into the lungs every 4 (four) hours as needed for shortness of breath.          Management plans discussed with the patient and she is in agreement. Stable for discharge home  Patient should follow up with dr Rogue Bussing  CODE STATUS:     Code Status Orders  (From admission, onward)         Start     Ordered   06/14/19 0416  Full code  Continuous     06/14/19 0418        Code Status History    Date Active Date Inactive Code Status Order ID Comments User Context   06/07/2019 1536 06/10/2019 2044 Full Code [id number]  Sela Hua, MD Inpatient   05/19/2019 0313 05/22/2019 1545 Full Code [id number]  Mayer Camel, NP Inpatient   11/19/2018 1803 11/26/2018 2238 Full Code [id number]  Nicholes Mango, MD Inpatient   10/17/2018 1910 10/18/2018 2119 Full Code [id number]  Hillary Bow, MD Inpatient   10/08/2018 1443 10/11/2018 1920 Full Code [id number]  Nicholes Mango, MD Inpatient   07/27/2018 1621 08/01/2018 1432 Full Code [id number]  Demetrios Loll, MD Inpatient   06/10/2018 1754 06/12/2018 1614 Full Code [id number]  Dustin Flock, MD Inpatient   04/24/2018 1451 04/27/2018 1700 Full Code [id number]  Lovell Sheehan, MD Inpatient   12/03/2017 1619 12/08/2017 1606 Full Code [id number]  Epifanio Lesches, MD ED   09/12/2017 0024 09/15/2017 1721 Full Code [id number]  [name], MD ED   08/27/2017 0223 09/05/2017 1355 Full Code [id number]  Herbert Pun, MD  Inpatient   Advance Care Planning Activity    Advance Directive Documentation     Most Recent Value  Type of Advance Directive  Healthcare Power of Attorney  Pre-existing out of facility DNR order (yellow form or pink MOST form)  -  "MOST" Form in Place?  -      TOTAL TIME TAKING CARE OF THIS PATIENT: 38 minutes.    Note: This dictation was prepared with Dragon dictation along with smaller phrase technology. Any transcriptional errors that result from this process are unintentional.  Bettey Costa M.D on 06/15/2019 at 9:26 AM  Between 7am to 6pm - Pager - [phone] After 6pm go to [email or web address] - password [email or web address] [facility]  Office  [phone]  CC: Primary care physician; [place]

## 2019-06-16 ENCOUNTER — Ambulatory Visit: Payer: Medicaid Other | Admitting: Surgery

## 2019-06-16 ENCOUNTER — Encounter: Payer: Self-pay | Admitting: Gastroenterology

## 2019-06-18 ENCOUNTER — Other Ambulatory Visit: Payer: Self-pay

## 2019-06-18 DIAGNOSIS — E2740 Unspecified adrenocortical insufficiency: Secondary | ICD-10-CM

## 2019-06-18 NOTE — Progress Notes (Signed)
Called patient unable to leave message will try again later.

## 2019-06-19 ENCOUNTER — Inpatient Hospital Stay (HOSPITAL_BASED_OUTPATIENT_CLINIC_OR_DEPARTMENT_OTHER): Payer: Medicaid Other | Admitting: Internal Medicine

## 2019-06-19 ENCOUNTER — Other Ambulatory Visit: Payer: Self-pay

## 2019-06-19 ENCOUNTER — Inpatient Hospital Stay: Payer: Medicaid Other | Attending: Internal Medicine

## 2019-06-19 ENCOUNTER — Encounter: Payer: Self-pay | Admitting: Internal Medicine

## 2019-06-19 ENCOUNTER — Inpatient Hospital Stay: Payer: Medicaid Other

## 2019-06-19 DIAGNOSIS — C538 Malignant neoplasm of overlapping sites of cervix uteri: Secondary | ICD-10-CM

## 2019-06-19 DIAGNOSIS — Z5112 Encounter for antineoplastic immunotherapy: Secondary | ICD-10-CM | POA: Diagnosis present

## 2019-06-19 DIAGNOSIS — M109 Gout, unspecified: Secondary | ICD-10-CM | POA: Diagnosis not present

## 2019-06-19 DIAGNOSIS — C7801 Secondary malignant neoplasm of right lung: Secondary | ICD-10-CM | POA: Insufficient documentation

## 2019-06-19 DIAGNOSIS — C539 Malignant neoplasm of cervix uteri, unspecified: Secondary | ICD-10-CM | POA: Insufficient documentation

## 2019-06-19 DIAGNOSIS — Z90722 Acquired absence of ovaries, bilateral: Secondary | ICD-10-CM | POA: Insufficient documentation

## 2019-06-19 DIAGNOSIS — Z9221 Personal history of antineoplastic chemotherapy: Secondary | ICD-10-CM | POA: Insufficient documentation

## 2019-06-19 DIAGNOSIS — R197 Diarrhea, unspecified: Secondary | ICD-10-CM | POA: Diagnosis not present

## 2019-06-19 DIAGNOSIS — Z923 Personal history of irradiation: Secondary | ICD-10-CM | POA: Diagnosis not present

## 2019-06-19 DIAGNOSIS — G629 Polyneuropathy, unspecified: Secondary | ICD-10-CM | POA: Insufficient documentation

## 2019-06-19 DIAGNOSIS — Z86718 Personal history of other venous thrombosis and embolism: Secondary | ICD-10-CM | POA: Diagnosis not present

## 2019-06-19 DIAGNOSIS — Z7901 Long term (current) use of anticoagulants: Secondary | ICD-10-CM | POA: Diagnosis not present

## 2019-06-19 DIAGNOSIS — Z7189 Other specified counseling: Secondary | ICD-10-CM

## 2019-06-19 DIAGNOSIS — Z9114 Patient's other noncompliance with medication regimen: Secondary | ICD-10-CM | POA: Insufficient documentation

## 2019-06-19 DIAGNOSIS — R112 Nausea with vomiting, unspecified: Secondary | ICD-10-CM | POA: Insufficient documentation

## 2019-06-19 DIAGNOSIS — Z95828 Presence of other vascular implants and grafts: Secondary | ICD-10-CM

## 2019-06-19 DIAGNOSIS — Z79899 Other long term (current) drug therapy: Secondary | ICD-10-CM | POA: Insufficient documentation

## 2019-06-19 DIAGNOSIS — C7802 Secondary malignant neoplasm of left lung: Secondary | ICD-10-CM | POA: Insufficient documentation

## 2019-06-19 DIAGNOSIS — Z86711 Personal history of pulmonary embolism: Secondary | ICD-10-CM | POA: Diagnosis not present

## 2019-06-19 LAB — CBC WITH DIFFERENTIAL/PLATELET
Abs Immature Granulocytes: 0.05 10*3/uL (ref 0.00–0.07)
Basophils Absolute: 0 10*3/uL (ref 0.0–0.1)
Basophils Relative: 0 %
Eosinophils Absolute: 0.1 10*3/uL (ref 0.0–0.5)
Eosinophils Relative: 1 %
HCT: 35.4 % — ABNORMAL LOW (ref 36.0–46.0)
Hemoglobin: 11.5 g/dL — ABNORMAL LOW (ref 12.0–15.0)
Immature Granulocytes: 1 %
Lymphocytes Relative: 24 %
Lymphs Abs: 1.3 10*3/uL (ref 0.7–4.0)
MCH: 32.8 pg (ref 26.0–34.0)
MCHC: 32.5 g/dL (ref 30.0–36.0)
MCV: 100.9 fL — ABNORMAL HIGH (ref 80.0–100.0)
Monocytes Absolute: 0.3 10*3/uL (ref 0.1–1.0)
Monocytes Relative: 6 %
Neutro Abs: 3.6 10*3/uL (ref 1.7–7.7)
Neutrophils Relative %: 68 %
Platelets: 165 10*3/uL (ref 150–400)
RBC: 3.51 MIL/uL — ABNORMAL LOW (ref 3.87–5.11)
RDW: 13.8 % (ref 11.5–15.5)
WBC: 5.3 10*3/uL (ref 4.0–10.5)
nRBC: 0 % (ref 0.0–0.2)

## 2019-06-19 LAB — COMPREHENSIVE METABOLIC PANEL
ALT: 69 U/L — ABNORMAL HIGH (ref 0–44)
AST: 50 U/L — ABNORMAL HIGH (ref 15–41)
Albumin: 3.5 g/dL (ref 3.5–5.0)
Alkaline Phosphatase: 126 U/L (ref 38–126)
Anion gap: 7 (ref 5–15)
BUN: 13 mg/dL (ref 6–20)
CO2: 26 mmol/L (ref 22–32)
Calcium: 8.9 mg/dL (ref 8.9–10.3)
Chloride: 101 mmol/L (ref 98–111)
Creatinine, Ser: 0.68 mg/dL (ref 0.44–1.00)
GFR calc Af Amer: >60 mL/min (ref >60)
GFR calc non Af Amer: >60 mL/min (ref >60)
Glucose, Bld: 99 mg/dL (ref 70–99)
Potassium: 3.8 mmol/L (ref 3.5–5.1)
Sodium: 134 mmol/L — ABNORMAL LOW (ref 135–145)
Total Bilirubin: 0.5 mg/dL (ref 0.3–1.2)
Total Protein: 7.1 g/dL (ref 6.5–8.1)

## 2019-06-19 LAB — MAGNESIUM: Magnesium: 1.4 mg/dL — ABNORMAL LOW (ref 1.7–2.4)

## 2019-06-19 MED ORDER — HEPARIN SOD (PORK) LOCK FLUSH 100 UNIT/ML IV SOLN
500.0000 [IU] | Freq: Once | INTRAVENOUS | Status: AC | PRN
  Administered 2019-06-19: 500 [IU]
  Filled 2019-06-19: qty 5

## 2019-06-19 MED ORDER — SODIUM CHLORIDE 0.9% FLUSH
10.0000 mL | Freq: Once | INTRAVENOUS | Status: AC
  Administered 2019-06-19: 10 mL via INTRAVENOUS
  Filled 2019-06-19: qty 10

## 2019-06-19 MED ORDER — MAGNESIUM SULFATE 4 GM/100ML IV SOLN
4.0000 g | Freq: Once | INTRAVENOUS | Status: AC
  Administered 2019-06-19: 10:00:00 4 g via INTRAVENOUS
  Filled 2019-06-19: qty 100

## 2019-06-19 MED ORDER — SODIUM CHLORIDE 0.9 % IV SOLN
200.0000 mg | Freq: Once | INTRAVENOUS | Status: AC
  Administered 2019-06-19: 12:00:00 200 mg via INTRAVENOUS
  Filled 2019-06-19: qty 8

## 2019-06-19 MED ORDER — SODIUM CHLORIDE 0.9 % IV SOLN
Freq: Once | INTRAVENOUS | Status: AC
  Administered 2019-06-19: 10:00:00 via INTRAVENOUS
  Filled 2019-06-19: qty 250

## 2019-06-19 NOTE — Progress Notes (Signed)
[facility] NOTE  Patient Care Team: [place] as PCP - General (Family Medicine) Mellody Drown, MD as Consulting Physician (Obstetrics and Gynecology) [name], MD as Consulting Physician (Gastroenterology) Michael Boston, MD as Consulting Physician (General Surgery) Lovell Sheehan, MD as Consulting Physician (Orthopedic Surgery) Cammie Sickle, MD as Consulting Physician (Oncology) Earnestine Leys, MD as Consulting Physician (Orthopedic Surgery)  CHIEF COMPLAINTS/PURPOSE OF CONSULTATION: Cervical cancer  #  Oncology History Overview Note  # dec 2017- CERVICAL ADENO CA [[place]]; Stage IB [Dr. Christene Slates at [facility] [facility] in [place], [place];No surgery- Chemo-RT;  # 2018-sep - lung nodules [in [place]]  # AUG 20th, 2019-#1 Carbo-Taxol s/p 6 cycles- [No avastin sec to blood clots]: Feb 2020-bilateral lung nodules with 1 cm in size. March 2020- finished carbo-taxol #7; poor tolerance to [place].  # April 15 th 2020- Start Taxol weekly x 3; one week OFF;  # July 2020-CT- bil cavitary lesions- slightly bigger by few mm/overall STABLE; CT a/p-NED. 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  She received treatment from 11/2016-05/2017.  01/2017 cisplatin x 2 and carboplatin x 1 (01/29/2017) due to ARF and XRT.  XRT was followed by T & O on 02/01/2017 and T & N 02/10/2017 and 02/20/2017.  Course was complicated by 80 pound weight loss, nausea, vomiting, electrolyte wasting (potassium and magnesium).   She describes that.  Is been sick constantly requiring at least 20 hospitalizations.  Follow-up CT chest and PET on 06/2017. Per patient, 'radiation worked' and no disease in the abdomen.  At that time she was noted to have lung nodules that were growing and follow-up imaging was scheduled for 10/2017.  She was admitted to hospital in [place] for small bowel obstruction which was managed conservatively and she was home for a week prior to traveling to [place] for Thanksgiving holiday where she has family.  She presented to ER in [place] on 08/2017 with nausea, vomiting, and lower abdominal pain.  Symptoms did not respond to conservative treatment.   CT on 08/26/2017 revealed small bowel obstruction with transition in the pelvis just superior to the uterus rather was a long segment of distal ileum with fatty wall thickening compatible with chronic inflammation and/or radiation enteritis. Imaging showed numerous pulmonary nodules consistent with metastatic disease. She underwent laparotomy and right ileocolectomy on 08/31/2017 at [facility].  Surgical findings revealed a thickened, matted, and scarred piece of distal small bowel close to the ileocecal valve.  She was discharged on 09/05/2017.  Pain markedly increased in intensity and imaging was performed on 09/11/2017 which revealed: Debris within the anterior abdominal wall incision concerning for infection versus packing material, s/p post ileo-colectomy with expected postoperative changes, mild colonic ileus, numerous pulmonary nodules highly concerning for metastatic disease, punctate nonobstructing nephrolithiasis.  Staples were removed and one was packed.  She was started on doxycycline.  Abdominal and pelvic CT without contrast on 09/11/2017 revealed debris within anterior abdominal wall incision concerning for infection, versus packing material.   She is s/p ileocolectomy with expected postoperative changes and mild colonic ileus.   There  were numerous pulmonary nodules highly concerning for metastatic disease and punctate nonobstructing nephrolithiasis. She was readmitted on 09/12/2017.  She describes the onset of lower abdominal pain on 09/09/2017.  Pain markedly increased in intensity on 09/11/2017.   Staples were removed and the wound packed.  She was started on doxycycline.  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   PET scan was ordered to evaluate enlarging lung nodules with concern for recurrent cervical cancer but scan was delayed due to insurance and need to be performed in [place].  Presented to ER on 12/03/2017 for abdominal pain and emesis.  Imaging concerning for worsening possible uterine infarct and she was admitted to hospital.  Pelvic MRI was unremarkable.  Remote scarring type changes of uterus thought to be possibly related to radiation.  She was discharged on 12/08/2017.  Underwent endoscopy and colonoscopy on 12/20/2017.    On 02/22/2018 she underwent laser ablation of condylomata around the anus and vagina under anesthesia with Dr. Johney Maine.   04/15/2018:  Chest, abdomen, and pelvis CT revealed innumerable (> 100) cavitary nodules scattered in the lungs, moderately enlarging compared to the 11/08/2017 PET-CT, suspicious for metastatic disease.  One index node in the RLL measures 1.0 x 1.1 cm (previously 0.6 x 0.6 cm).  There were no new nodules.  There was an ill-defined wall thickening in the rectosigmoid with surrounding stranding along fascia planes, probably sequela from prior radiation therapy.  There was multilevel lumbar  impingement due to spondylosis and degenerative disc disease.  There was heterogeneous enhancement in the uterus (some possibly from prior radiation therapy).   04/23/2018:  PET scan revealed numerous scattered solid and cavitary nodules in the lungs stable increased in size compared to the prior PET-CT from 11/08/2017. Largest nodule was 1.1 cm in the LUL (SUV 1.9).  These demonstrated low-grade metabolic activity up to a maximum SUV of about 2.3, increased from 11/08/2017.    Case was discussed at tumor board on 04/25/2018. Consensus to pursue CT-guided biopsy (05/06/18) which revealed: Metastatic adenocarcinoma, morphologically consistent with cervical adenocarcinoma.  She has history of chronic hepatitis C which is managed by GI.  Hepatitis C genotype is 2a/2c.  She receives B12 injections for history of B12 deficiency.  On 04/24/2018 she underwent left total knee replacement with Dr. [name]. --------------------------------------------------------   DIAGNOSIS: Cervical adenocarcinoma  STAGE: IV        ;GOALS: Palliative  CURRENT/MOST RECENT THERAPY : Keytruda    Malignant neoplasm of overlapping sites of cervix ([place])  01/29/2019 - 04/23/2019 Chemotherapy   The patient had PACLitaxel (TAXOL) 156 mg in sodium chloride 0.9 % 250 mL chemo infusion (</= 38m/m2), 80 mg/m2 = 156 mg, Intravenous,  Once, 3 of 4 cycles Dose modification: 65 mg/m2 (original dose 80 mg/m2, Cycle 1, Reason: Provider Judgment) Administration: 156 mg (01/29/2019), 156 mg (02/05/2019), 126 mg (02/12/2019), 126 mg (02/26/2019), 126 mg (03/13/2019), 126 mg (03/27/2019), 126 mg (03/20/2019), 126 mg (04/03/2019), 126 mg (04/10/2019)  for chemotherapy treatment.    05/08/2019 -  Chemotherapy   The patient had pembrolizumab (KEYTRUDA) 200 mg in sodium chloride 0.9 % 50 mL chemo infusion, 200 mg, Intravenous, Once, 3 of  6 cycles Administration: 200 mg (05/08/2019), 200 mg (05/29/2019), 200 mg (06/19/2019)  for chemotherapy treatment.        HISTORY OF PRESENTING ILLNESS:  Joanna Hall 53 y.o.  female with a history of adenocarcinoma the cervix with metastasis to the lung most recently started on Keytruda status post cycle #2 is here for follow-up.  Patient was recently admitted to hospital for worsening nausea vomiting abdominal pain.  CT scan abdomen pelvis was negative for any acute process; however lower lung bases-progression of lung disease noted.  Patient was noted to have severe adrenal insufficiency.  A.m. cortisol 0.6.  Patient was started on IV Solu-Cortef; and discharged home on hydrocortisone 20 mg a day.  Patient had not been taking hydrocortisone for the last 2 months or so.  Patient today complains of pain in the right knee/with swelling.  Patient has prior history of arthritis.  She is awaiting to see orthopedic doctor.  Abdominal discomfort improved.  Diarrhea improved.   Review of Systems  Constitutional: Positive for malaise/fatigue. Negative for chills, diaphoresis, fever and weight loss.  HENT: Negative for nosebleeds and sore throat.   Eyes: Negative for double vision.  Respiratory: Negative for cough, hemoptysis, sputum production, shortness of breath and wheezing.   Cardiovascular: Negative for chest pain, palpitations, orthopnea and leg swelling.  Gastrointestinal: Positive for abdominal pain and diarrhea. Negative for blood in stool, constipation, heartburn, melena, nausea and vomiting.  Genitourinary: Negative for dysuria, frequency and urgency.  Musculoskeletal: Positive for back pain and joint pain.  Skin: Negative.  Negative for itching and rash.  Neurological: Positive for tingling. Negative for dizziness, focal weakness, weakness and headaches.  Endo/Heme/Allergies: Does not bruise/bleed easily.  Psychiatric/Behavioral: Negative for depression. The patient is not nervous/anxious and does not have insomnia.      MEDICAL HISTORY:  Past Medical History:  Diagnosis Date  .  Abdominal pain 06/10/2018  . Abnormal cervical Papanicolaou smear 09/18/2017  . Anxiety   . Aortic atherosclerosis ([place])   . Arthritis    neck and knees  . 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 Blood transfusion without reported diagnosis   . Cervical cancer ([place]) 09/2016   mets lung  . Chronic anal fissure   . Chronic diarrhea   . Dyspnea   . Erosive gastropathy 09/18/2017  . Factor V Leiden mutation ([place])   . Fecal incontinence   . Genital warts   . GERD (gastroesophageal reflux disease)   . GI bleed 10/08/2018  . Heart murmur   . Hematochezia   . Hemorrhoids   . 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 Hepatitis, chronic ([place]) 05/05/2017  . History of cancer chemotherapy    completed 06/2017  . History of Clostridium difficile infection    while undergoing chemo.  Negative test 10/2017  . Ileocolic anastomotic leak   . 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 Intestinal infection due to Clostridium difficile 09/18/2017  . Macrocytic anemia with vitamin B12 deficiency   . Multiple gastric ulcers   . Nausea vomiting and diarrhea   . Pancolitis ([place]) 07/27/2018  . Perianal condylomata   . Pneumonia    History of  . Pulmonary nodules   . Rectal bleeding   . Small bowel obstruction ([place]) 08/2017  . Stiff neck    limited right turn  . Vitamin D deficiency     SURGICAL  HISTORY: Past Surgical History:  Procedure Laterality Date  . CHOLECYSTECTOMY    . COLON SURGERY  08/2017   resection  . COLONOSCOPY WITH PROPOFOL N/A 12/20/2017   Procedure: COLONOSCOPY WITH PROPOFOL;  Surgeon: [name], MD;  Location[facility] ENDOSCOPY;  Service: Gastroenterology;  Laterality: N/A;  . COLONOSCOPY WITH PROPOFOL N/A 07/30/2018   Procedure: COLONOSCOPY WITH PROPOFOL;  Surgeon: [name], MD;  Location: [facility] ENDOSCOPY;  Service: Gastroenterology;  Laterality: N/A;  . COLONOSCOPY WITH PROPOFOL N/A 10/10/2018    Procedure: COLONOSCOPY WITH PROPOFOL;  Surgeon: Lucilla Lame, MD;  Location: [facility] ENDOSCOPY;  Service: Endoscopy;  Laterality: N/A;  . DIAGNOSTIC LAPAROSCOPY    . ESOPHAGOGASTRODUODENOSCOPY (EGD) WITH PROPOFOL N/A 12/20/2017   Procedure: ESOPHAGOGASTRODUODENOSCOPY (EGD) WITH PROPOFOL;  Surgeon: [name], MD;  Location: [facility];  Service: Gastroenterology;  Laterality: N/A;  . ESOPHAGOGASTRODUODENOSCOPY (EGD) WITH PROPOFOL  07/30/2018   Procedure: ESOPHAGOGASTRODUODENOSCOPY (EGD) WITH PROPOFOL;  Surgeon: [name], MD;  Location: [facility] ENDOSCOPY;  Service: Gastroenterology;;  . Otho Darner SIGMOIDOSCOPY N/A 11/21/2018   Procedure: FLEXIBLE SIGMOIDOSCOPY;  Surgeon: [name], MD;  Location: [facility] ENDOSCOPY;  Service: Gastroenterology;  Laterality: N/A;  . LAPAROTOMY N/A 08/31/2017   Procedure: EXPLORATORY LAPAROTOMY for SBO, ileocolectomy, removal of piece of uterine wall;  Surgeon: [name], MD;  Location: [facility] ORS;  Service: General;  Laterality: N/A;  . LASER ABLATION CONDOLAMATA N/A 02/22/2018   Procedure: LASER ABLATION/REMOVAL OF OVZCHYIFOYD AROUND ANUS AND VAGINA;  Surgeon: Michael Boston, MD;  Location: [facility];  Service: General;  Laterality: N/A;  . OOPHORECTOMY    . PORTA CATH INSERTION N/A 05/13/2018   Procedure: PORTA CATH INSERTION;  Surgeon: Katha Cabal, MD;  Location: [facility];  Service: Cardiovascular;  Laterality: N/A;  . SMALL INTESTINE SURGERY    . TANDEM RING INSERTION     x3  . THORACOTOMY    . TOTAL KNEE ARTHROPLASTY Left 04/24/2018   Procedure: TOTAL KNEE ARTHROPLASTY;  Surgeon: Lovell Sheehan, MD;  Location: [facility] ORS;  Service: Orthopedics;  Laterality: Left;    SOCIAL HISTORY: Social History   Socioeconomic History  . Marital status: Divorced    Spouse name: Not on file  . Number of children: Not on file  . Years of education: Not on file  . Highest education level: Not on file  Occupational  History  . Not on file  Social Needs  . Financial resource strain: Not hard at all  . Food insecurity    Worry: Never true    Inability: Never true  . Transportation needs    Medical: No    Non-medical: No  Tobacco Use  . Smoking status: Former Smoker    Quit date: 10/16/2006    Years since quitting: 12.6  . Smokeless tobacco: Never Used  Substance and Sexual Activity  . Alcohol use: Not Currently    Frequency: Never    Comment: seldom  . Drug use: Yes    Types: Marijuana    Comment: not very often  . Sexual activity: Not Currently    Birth control/protection: Post-menopausal    Comment: Not Asked  Lifestyle  . Physical activity    Days per week: Patient refused    Minutes per session: Patient refused  . Stress: Only a little  Relationships  . Social connections    Talks on phone: Patient refused    Gets together: Patient refused    Attends religious service: Patient refused  Active member of club or organization: Patient refused    Attends meetings of clubs or organizations: Patient refused    Relationship status: Patient refused  . Intimate partner violence    Fear of current or ex partner: No    Emotionally abused: No    Physically abused: No    Forced sexual activity: No  Other Topics Concern  . Not on file  Social History Narrative  . Not on file    FAMILY HISTORY: Family History  Problem Relation Age of Onset  . Hypertension Father   . Diabetes Father   . Alcohol abuse Daughter   . Hypertension Maternal Grandmother   . Diabetes Maternal Grandmother   . Diabetes Paternal Grandmother   . Hypertension Paternal Grandmother     ALLERGIES:  is allergic to ketamine.  MEDICATIONS:  Current Outpatient Medications  Medication Sig Dispense Refill  . amitriptyline (ELAVIL) 75 MG tablet Take 1 tablet (75 mg total) by mouth at bedtime. 90 tablet 1  . Calcium Carb-Cholecalciferol (CALCIUM 500 +D) 500-400 MG-UNIT TABS Take 2 tablets by mouth daily.    .  diphenoxylate-atropine (LOMOTIL) 2.5-0.025 MG tablet TAKE (2) TABLETS FOUR TIMES DAILY. 240 tablet 0  . hydrocortisone (CORTEF) 20 MG tablet Take 1 tablet (20 mg total) by mouth daily. 30 tablet 0  . hydrOXYzine (ATARAX/VISTARIL) 10 MG tablet Take 1 tablet (10 mg total) by mouth 3 (three) times daily as needed. 60 tablet 0  . hyoscyamine (LEVBID) 0.375 MG 12 hr tablet Take 1 tablet (0.375 mg total)by mouth 2 (two) times daily. 60 tablet 0  . ondansetron (ZOFRAN ODT) 4 MG disintegrating tablet Take 1 tablet (4 mg total) by mouth every 8 (eight) hours as needed for nausea or vomiting. 30 tablet 2  . oxyCODONE (OXYCONTIN) 15 mg 12 hr tablet Take 1 tablet (15 mg total) by mouth every 12 (twelve) hours. 60 tablet 0  . Oxycodone HCl 10 MG TABS Take 1 tablet (10 mg total) by mouth every 6 (six) hours as needed (for breakthrough pain). 120 tablet 0  . pantoprazole (PROTONIX) 40 MG tablet Take 1 tablet (40 mg total) by mouth daily. 90 tablet 1  . potassium chloride SA (K-DUR) 20 MEQ tablet Take 1 tablet (20 mEq total) by mouth daily as needed (if having nausea/vomiting- to supplements for lost potassium). 30 tablet 0  . promethazine (PHENERGAN) 25 MG tablet Take 1 tablet (25 mg total) by mouth every 8 (eight) hours as needed for nausea or vomiting. 30 tablet 3  . rivaroxaban (XARELTO) 20 MG TABS tablet Take 1 tablet (20 mg total) by mouth daily with supper. (Patient taking differently: Take 20 mg by mouth daily with supper. Takes in the morning) 30 tablet 6  . famotidine (PEPCID) 20 MG tablet Take 1 tablet (20 mg total) by mouth 2 (two) times daily for 5 days. 10 tablet 0  . feeding supplement, ENSURE ENLIVE, (ENSURE ENLIVE) LIQD Take 237 mLs by mouth 3 (three) times daily between meals. 237 mL 12  . Multiple Vitamins-Minerals (MULTIVITAMIN WITH MINERALS) tablet Take 1 tablet by mouth daily. 30 tablet 0  . VENTOLIN HFA 108 (90 Base) MCG/ACT inhaler Inhale 1-2 puffs into the lungs every 4 (four) hours as needed  for shortness of breath. 1 Inhaler 1   No current facility-administered medications for this visit.    Facility-Administered Medications Ordered in Other Visits  Medication Dose Route Frequency Provider Last Rate Last Dose  . heparin lock flush 100 unit/mL  500 Units  Intravenous Once Nolon Stalls C, MD      . sodium chloride 0.9 % 1,000 mL with potassium chloride 20 mEq infusion   Intravenous Once Honor Loh E, NP      . sodium chloride flush (NS) 0.9 % injection 10 mL  10 mL Intravenous Once Borders, Kirt Boys, NP          .  PHYSICAL EXAMINATION: ECOG PERFORMANCE STATUS: 1 - Symptomatic but completely ambulatory  Vitals:   06/19/19 0857  BP: 123/86  Pulse: 94  Temp: (!) 97.3 F (36.3 C)   Filed Weights   06/19/19 0857  Weight: 166 lb 3.2 oz (75.4 kg)    Physical Exam  Constitutional: She is oriented to person, place, and time and well-developed, well-nourished, and in no distress.  Alone.  Walking herself.   HENT:  Head: Normocephalic and atraumatic.  Mouth/Throat: Oropharynx is clear and moist. No oropharyngeal exudate.  Eyes: Pupils are equal, round, and reactive to light.  Neck: Normal range of motion. Neck supple.  Cardiovascular: Normal rate and regular rhythm.  Pulmonary/Chest: Effort normal and breath sounds normal. No respiratory distress. She has no wheezes.  Abdominal: Soft. Bowel sounds are normal. She exhibits no distension and no mass. There is no rebound and no guarding.  Musculoskeletal: Normal range of motion.        General: No tenderness or edema.  Neurological: She is alert and oriented to person, place, and time.  Skin: Skin is warm.  Psychiatric: Affect normal.     LABORATORY DATA:  I have reviewed the data as listed Lab Results  Component Value Date   WBC 5.3 06/19/2019   HGB 11.5 (L) 06/19/2019   HCT 35.4 (L) 06/19/2019   MCV 100.9 (H) 06/19/2019   PLT 165 06/19/2019   Recent Labs    07/08/18 1035  10/21/18 0845   06/13/19 2232 06/14/19 0547 06/15/19 0700 06/19/19 0805  NA 136   < > 134*   < > 139 139 139 134*  K 4.2   < > 3.9   < > 2.2* 3.1* 4.3 3.8  CL 107   < > 98   < > 108 107 105 101  CO2 22   < > 24   < > 20* 25 27 26   GLUCOSE 119*   < > 111*   < > 153* 95 77 99  BUN 15   < > 17   < > 10 8 8 13   CREATININE 0.71   < > 0.91   < > 0.61 0.58 0.72 0.68  CALCIUM 8.7*   < > 9.6   < > 8.2* 8.2* 8.9 8.9  GFRNONAA >60   < > >60   < > >60 >60 >60 >60  GFRAA >60   < > >60   < > >60 >60 >60 >60  PROT 6.5   < > 8.6*   < > 6.7 5.9*  --  7.1  ALBUMIN 3.4*   < > 4.4   < > 3.5 2.9*  --  3.5  AST 26   < > 48*   < > 36 38  --  50*  ALT 33   < > 67*   < > 40 40  --  69*  ALKPHOS 132*   < > 185*   < > 122 97  --  126  BILITOT 0.4   < > 1.4*   < > 0.8 0.5  --  0.5  BILIDIR <0.1  --  0.3*  --   --   --   --   --   IBILI NOT CALCULATED  --   --   --   --   --   --   --    < > = values in this interval not displayed.    RADIOGRAPHIC STUDIES: I have personally reviewed the radiological images as listed and agreed with the findings in the report. Ct Head Wo Contrast  Result Date: 06/07/2019 CLINICAL DATA:  Headache, history of metastatic cervical cancer EXAM: CT HEAD WITHOUT CONTRAST TECHNIQUE: Contiguous axial images were obtained from the base of the skull through the vertex without intravenous contrast. COMPARISON:  05/18/2019 FINDINGS: Brain: No evidence of acute infarction, hemorrhage, hydrocephalus, extra-axial collection or mass lesion/mass effect. Vascular: No hyperdense vessel or unexpected calcification. Skull: Normal. Negative for fracture or focal lesion. Sinuses/Orbits: The visualized paranasal sinuses are essentially clear. The mastoid air cells are unopacified. Other: None. IMPRESSION: Normal head CT. Electronically Signed   By: Julian Hy M.D.   On: 06/07/2019 11:04   Ct Angio Chest Pe W And/or Wo Contrast  Result Date: 05/23/2019 CLINICAL DATA:  Chest pain.  Metastatic cervical carcinoma  EXAM: CT ANGIOGRAPHY CHEST WITH CONTRAST TECHNIQUE: Multidetector CT imaging of the chest was performed using the standard protocol during bolus administration of intravenous contrast. Multiplanar CT image reconstructions and MIPs were obtained to evaluate the vascular anatomy. CONTRAST:  43m OMNIPAQUE IOHEXOL 350 MG/ML SOLN COMPARISON:  Chest CT May 18, 2019 FINDINGS: Cardiovascular: There is no demonstrable pulmonary embolus. There is no thoracic aortic aneurysm or dissection. There is calcification at the origin of the right innominate artery. Other visualized great vessels appear unremarkable. There are scattered foci of aortic atherosclerosis. There is no pericardial effusion or pericardial thickening evident. Port-A-Cath tip near cavoatrial junction. Mediastinum/Nodes: Visualized thyroid appears unremarkable. There is no appreciable thoracic adenopathy. No esophageal lesions evident. Lungs/Pleura: There are nodular metastases throughout the lungs bilaterally, several of which are cavitated, stable. Nodular opacities range in size from as small as 2 mm to as large as 1.6 cm. There is no edema or consolidation. No pleural effusions. Upper Abdomen: Gallbladder absent. Upper abdominal structures otherwise appear unremarkable. Musculoskeletal: No blastic or lytic bone lesions. No chest wall lesions evident. Note that the port is present anteriorly on the right, superficial to the pectoralis muscles. Review of the MIP images confirms the above findings. IMPRESSION: 1. No demonstrable pulmonary embolus. No thoracic aortic aneurysm or dissection. Foci of aortic atherosclerosis noted. 2. Nodular lesions throughout the lungs consistent with metastatic disease. Multiple parenchymal lung metastases show cavitation. Largest metastatic focus measures 1.6 cm in size. 3.  No evident adenopathy. 4.  Gallbladder absent. Aortic Atherosclerosis (ICD10-I70.0). Electronically Signed   By: WLowella GripIII M.D.   On:  05/23/2019 14:07   Ct Abdomen Pelvis W Contrast  Result Date: 06/07/2019 CLINICAL DATA:  Metastatic cervical cancer, nausea, vomiting and diarrhea EXAM: CT ABDOMEN AND PELVIS WITH CONTRAST TECHNIQUE: Multidetector CT imaging of the abdomen and pelvis was performed using the standard protocol following bolus administration of intravenous contrast. CONTRAST:  1016mOMNIPAQUE IOHEXOL 300 MG/ML  SOLN COMPARISON:  04/21/2019 FINDINGS: Lower chest: Continue progression of lower chest numerous bilateral pulmonary metastases. These have increased in size and number. Some are cavitary. No pleural effusion. No superimposed lower lobe consolidation or collapse. Normal heart size. No pericardial effusion. Hepatobiliary: Stable intra and extrahepatic biliary dilatation following cholecystectomy. No focal hepatic abnormality. Pancreas: Unremarkable. No pancreatic ductal  dilatation or surrounding inflammatory changes. Spleen: Normal in size without focal abnormality. Adrenals/Urinary Tract: Adrenal glands are unremarkable. Kidneys are normal, focal lesion, or hydronephrosis. Bladder is unremarkable. Punctate nonobstructing subcentimeter bilateral intrarenal calculi. Stomach/Bowel: Negative for bowel obstruction, significant dilatation, ileus, or free air. Patient appears status post right colectomy. Distal colon demonstrates mild wall thickening of the descending colon, sigmoid rectum mucosal enhancement, similar to the prior study which may represent colitis. This could be related to prior pelvic radiation. No fluid collection, hemorrhage, hematoma, abscess, or ascites. Vascular/Lymphatic: Abdominal atherosclerosis without aneurysm occlusive process. No veno-occlusive disease. Mesenteric and renal vasculature appear patent. No bulky adenopathy. Reproductive: Atrophic uterus. No adnexal abnormality. No pelvic free fluid, fluid collection, or abscess. Other: Intact abdominal wall.  No hernia. Musculoskeletal: Osteopenia and  degenerative change with associated scoliosis. No acute osseous finding. IMPRESSION: Continue progression of pulmonary metastases. Stable biliary dilatation following cholecystectomy Similar distal colonic wall thickening and mucosal enhancement predominately of the sigmoid and rectum compatible with colitis, possibly secondary to radiation. Abdominal atherosclerosis without aneurysm Negative for bowel obstruction, free, or abscess. Nonobstructing nephrolithiasis. Electronically Signed   By: Jerilynn Mages.  Shick M.D.   On: 06/07/2019 12:32   Dg Hip Unilat W Or Wo Pelvis 2-3 Views Right  Result Date: 06/07/2019 CLINICAL DATA:  Right hip pain for 1 week. No reported injury. History of pseudogout. EXAM: DG HIP (WITH OR WITHOUT PELVIS) 2-3V RIGHT COMPARISON:  04/21/2027 CT abdomen/pelvis. FINDINGS: There is no evidence of hip fracture or dislocation. There is no evidence of significant arthropathy or other focal bone abnormality. Degenerative changes in the visualized lower lumbar spine. IMPRESSION: No acute osseous abnormality. Electronically Signed   By: Ilona Sorrel M.D.   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  Currently on Keytruda-question inflammatory arthritis versus others.  See discussion below  # proceed with Keytruda cycle #3 Labs today reviewed;  acceptable for treatment today.   # Left effusion/inflammatory arthritis-status post aspiration negative for infection-improved.  Currently right knee inflammation/swelling-await evaluation with orthopedics today.  # adrenal insuffiency-noncompliance with medication [patient had been confused between hydroxyzine and hydrocortisone] improved- on hydrocortisone  20 mg/day.   # Chronic diarrhea- worse/quality of life issue-"short bowel"discussed with GI re: colostomy??  #Peripheral neuropathy stable; continue gabapentin.  #Hypomagnesemia-secondary platinum chemotherapy-mag 1.4 mag today.  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  Recommend evaluation with a CT scan in few months-elevated continued progression discussed that we will have to switch therapies.  # DISPOSITION:  # Keytruda/Mag today. # 1 weeks- mag level; IVFs over 1 hour /Mag2h infusion # 2 weeks-  mag level; IVFs over 1 hour /Mag2h infusion # follow up in 3 weeks-MD-Keytruda /cbc/cmp/mag/; Mag 4 gm/2 hour- Dr.B    All questions were answered. The patient knows to call the clinic with any problems, questions or concerns.     GCammie Sickle MD 06/20/2019 9:00 AM

## 2019-06-19 NOTE — Assessment & Plan Note (Addendum)
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  Currently on Keytruda-question inflammatory arthritis versus others.  See discussion below  # proceed with Keytruda cycle #3 Labs today reviewed;  acceptable for treatment today.   # Left effusion/inflammatory arthritis-status post aspiration negative for infection-improved.  Currently right knee inflammation/swelling-await evaluation with orthopedics today.  # adrenal insuffiency-noncompliance with medication [patient had been confused between hydroxyzine and hydrocortisone] improved- on hydrocortisone 20 mg/day.   # Chronic diarrhea- worse/quality of life issue-"short bowel"discussed with GI re: colostomy??  #Peripheral neuropathy stable; continue gabapentin.  #Hypomagnesemia-secondary platinum chemotherapy-mag 1.4 mag today.  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  Recommend evaluation with a CT scan in few months-elevated continued progression discussed that we will have to switch therapies.  # DISPOSITION:  # Keytruda/Mag today. # 1 weeks- mag level; IVFs over 1 hour /Mag2h infusion # 2 weeks-  mag level; IVFs over 1 hour /Mag2h infusion # follow up in 3 weeks-MD-Keytruda /cbc/cmp/mag/; Mag 4 gm/2 hour- Dr.B

## 2019-06-19 NOTE — Progress Notes (Signed)
Er Dr. Rogue Bussing pt to receive Keytruda treatment and Magnesium 4g PIV.

## 2019-06-21 ENCOUNTER — Other Ambulatory Visit: Payer: Self-pay

## 2019-06-21 ENCOUNTER — Emergency Department: Payer: Medicaid Other

## 2019-06-21 ENCOUNTER — Inpatient Hospital Stay: Payer: Medicaid Other

## 2019-06-21 ENCOUNTER — Inpatient Hospital Stay
Admission: EM | Admit: 2019-06-21 | Discharge: 2019-06-25 | DRG: 389 | Disposition: A | Discharge status: Home / Self Care | Payer: Medicaid Other | Attending: Internal Medicine | Admitting: Internal Medicine

## 2019-06-21 DIAGNOSIS — F419 Anxiety disorder, unspecified: Secondary | ICD-10-CM | POA: Diagnosis present

## 2019-06-21 DIAGNOSIS — K56609 Unspecified intestinal obstruction, unspecified as to partial versus complete obstruction: Secondary | ICD-10-CM | POA: Diagnosis not present

## 2019-06-21 DIAGNOSIS — Z96652 Presence of left artificial knee joint: Secondary | ICD-10-CM | POA: Diagnosis present

## 2019-06-21 DIAGNOSIS — Z79891 Long term (current) use of opiate analgesic: Secondary | ICD-10-CM

## 2019-06-21 DIAGNOSIS — E2740 Unspecified adrenocortical insufficiency: Secondary | ICD-10-CM | POA: Diagnosis present

## 2019-06-21 DIAGNOSIS — Z87891 Personal history of nicotine dependence: Secondary | ICD-10-CM

## 2019-06-21 DIAGNOSIS — Z8711 Personal history of peptic ulcer disease: Secondary | ICD-10-CM

## 2019-06-21 DIAGNOSIS — Z4659 Encounter for fitting and adjustment of other gastrointestinal appliance and device: Secondary | ICD-10-CM

## 2019-06-21 DIAGNOSIS — D6851 Activated protein C resistance: Secondary | ICD-10-CM | POA: Diagnosis present

## 2019-06-21 DIAGNOSIS — M436 Torticollis: Secondary | ICD-10-CM | POA: Diagnosis present

## 2019-06-21 DIAGNOSIS — K565 Intestinal adhesions [bands], unspecified as to partial versus complete obstruction: Principal | ICD-10-CM | POA: Diagnosis present

## 2019-06-21 DIAGNOSIS — Z6825 Body mass index (BMI) 25.0-25.9, adult: Secondary | ICD-10-CM

## 2019-06-21 DIAGNOSIS — Z86718 Personal history of other venous thrombosis and embolism: Secondary | ICD-10-CM

## 2019-06-21 DIAGNOSIS — D539 Nutritional anemia, unspecified: Secondary | ICD-10-CM | POA: Diagnosis present

## 2019-06-21 DIAGNOSIS — Z86711 Personal history of pulmonary embolism: Secondary | ICD-10-CM | POA: Diagnosis not present

## 2019-06-21 DIAGNOSIS — I4891 Unspecified atrial fibrillation: Secondary | ICD-10-CM | POA: Diagnosis present

## 2019-06-21 DIAGNOSIS — C539 Malignant neoplasm of cervix uteri, unspecified: Secondary | ICD-10-CM | POA: Diagnosis present

## 2019-06-21 DIAGNOSIS — C7801 Secondary malignant neoplasm of right lung: Secondary | ICD-10-CM | POA: Diagnosis present

## 2019-06-21 DIAGNOSIS — B182 Chronic viral hepatitis C: Secondary | ICD-10-CM | POA: Diagnosis present

## 2019-06-21 DIAGNOSIS — R112 Nausea with vomiting, unspecified: Secondary | ICD-10-CM | POA: Diagnosis present

## 2019-06-21 DIAGNOSIS — R1084 Generalized abdominal pain: Secondary | ICD-10-CM

## 2019-06-21 DIAGNOSIS — K219 Gastro-esophageal reflux disease without esophagitis: Secondary | ICD-10-CM | POA: Diagnosis present

## 2019-06-21 DIAGNOSIS — C7802 Secondary malignant neoplasm of left lung: Secondary | ICD-10-CM | POA: Diagnosis present

## 2019-06-21 DIAGNOSIS — E440 Moderate protein-calorie malnutrition: Secondary | ICD-10-CM | POA: Diagnosis present

## 2019-06-21 DIAGNOSIS — M170 Bilateral primary osteoarthritis of knee: Secondary | ICD-10-CM | POA: Diagnosis present

## 2019-06-21 DIAGNOSIS — C799 Secondary malignant neoplasm of unspecified site: Secondary | ICD-10-CM

## 2019-06-21 DIAGNOSIS — Z0189 Encounter for other specified special examinations: Secondary | ICD-10-CM

## 2019-06-21 DIAGNOSIS — K520 Gastroenteritis and colitis due to radiation: Secondary | ICD-10-CM | POA: Diagnosis present

## 2019-06-21 DIAGNOSIS — Z23 Encounter for immunization: Secondary | ICD-10-CM | POA: Diagnosis not present

## 2019-06-21 DIAGNOSIS — D519 Vitamin B12 deficiency anemia, unspecified: Secondary | ICD-10-CM | POA: Diagnosis present

## 2019-06-21 DIAGNOSIS — Z7901 Long term (current) use of anticoagulants: Secondary | ICD-10-CM

## 2019-06-21 DIAGNOSIS — Z888 Allergy status to other drugs, medicaments and biological substances status: Secondary | ICD-10-CM

## 2019-06-21 DIAGNOSIS — Z20828 Contact with and (suspected) exposure to other viral communicable diseases: Secondary | ICD-10-CM | POA: Diagnosis present

## 2019-06-21 DIAGNOSIS — Z8249 Family history of ischemic heart disease and other diseases of the circulatory system: Secondary | ICD-10-CM

## 2019-06-21 DIAGNOSIS — I700 Atherosclerosis of aorta: Secondary | ICD-10-CM | POA: Diagnosis present

## 2019-06-21 DIAGNOSIS — Z79899 Other long term (current) drug therapy: Secondary | ICD-10-CM

## 2019-06-21 DIAGNOSIS — Y842 Radiological procedure and radiotherapy as the cause of abnormal reaction of the patient, or of later complication, without mention of misadventure at the time of the procedure: Secondary | ICD-10-CM | POA: Diagnosis present

## 2019-06-21 DIAGNOSIS — M47812 Spondylosis without myelopathy or radiculopathy, cervical region: Secondary | ICD-10-CM | POA: Diagnosis present

## 2019-06-21 LAB — URINALYSIS, COMPLETE (UACMP) WITH MICROSCOPIC
Bacteria, UA: NONE SEEN
Bilirubin Urine: NEGATIVE
Glucose, UA: NEGATIVE mg/dL
Hgb urine dipstick: NEGATIVE
Ketones, ur: NEGATIVE mg/dL
Leukocytes,Ua: NEGATIVE
Nitrite: NEGATIVE
Protein, ur: NEGATIVE mg/dL
Specific Gravity, Urine: >1.046 — ABNORMAL HIGH (ref 1.005–1.030)
Squamous Epithelial / HPF: NONE SEEN (ref 0–5)
pH: 6 (ref 5.0–8.0)

## 2019-06-21 LAB — CBC WITH DIFFERENTIAL/PLATELET
Abs Immature Granulocytes: 0.04 10*3/uL (ref 0.00–0.07)
Basophils Absolute: 0 10*3/uL (ref 0.0–0.1)
Basophils Relative: 0 %
Eosinophils Absolute: 0 10*3/uL (ref 0.0–0.5)
Eosinophils Relative: 0 %
HCT: 36.5 % (ref 36.0–46.0)
Hemoglobin: 12.1 g/dL (ref 12.0–15.0)
Immature Granulocytes: 1 %
Lymphocytes Relative: 18 %
Lymphs Abs: 1.4 10*3/uL (ref 0.7–4.0)
MCH: 33 pg (ref 26.0–34.0)
MCHC: 33.2 g/dL (ref 30.0–36.0)
MCV: 99.5 fL (ref 80.0–100.0)
Monocytes Absolute: 0.4 10*3/uL (ref 0.1–1.0)
Monocytes Relative: 5 %
Neutro Abs: 5.9 10*3/uL (ref 1.7–7.7)
Neutrophils Relative %: 76 %
Platelets: 193 10*3/uL (ref 150–400)
RBC: 3.67 MIL/uL — ABNORMAL LOW (ref 3.87–5.11)
RDW: 13.7 % (ref 11.5–15.5)
WBC: 7.7 10*3/uL (ref 4.0–10.5)
nRBC: 0 % (ref 0.0–0.2)

## 2019-06-21 LAB — COMPREHENSIVE METABOLIC PANEL
ALT: 72 U/L — ABNORMAL HIGH (ref 0–44)
AST: 40 U/L (ref 15–41)
Albumin: 3.8 g/dL (ref 3.5–5.0)
Alkaline Phosphatase: 145 U/L — ABNORMAL HIGH (ref 38–126)
Anion gap: 11 (ref 5–15)
BUN: 18 mg/dL (ref 6–20)
CO2: 23 mmol/L (ref 22–32)
Calcium: 9.5 mg/dL (ref 8.9–10.3)
Chloride: 103 mmol/L (ref 98–111)
Creatinine, Ser: 0.75 mg/dL (ref 0.44–1.00)
GFR calc Af Amer: >60 mL/min (ref >60)
GFR calc non Af Amer: >60 mL/min (ref >60)
Glucose, Bld: 115 mg/dL — ABNORMAL HIGH (ref 70–99)
Potassium: 3.7 mmol/L (ref 3.5–5.1)
Sodium: 137 mmol/L (ref 135–145)
Total Bilirubin: 0.4 mg/dL (ref 0.3–1.2)
Total Protein: 7.5 g/dL (ref 6.5–8.1)

## 2019-06-21 LAB — PROTIME-INR
INR: 1 (ref 0.8–1.2)
Prothrombin Time: 12.8 seconds (ref 11.4–15.2)

## 2019-06-21 LAB — SARS CORONAVIRUS 2 BY RT PCR (HOSPITAL ORDER, PERFORMED IN ~~LOC~~ HOSPITAL LAB): SARS Coronavirus 2: NEGATIVE

## 2019-06-21 LAB — C DIFFICILE QUICK SCREEN W PCR REFLEX
C Diff antigen: NEGATIVE
C Diff interpretation: NOT DETECTED
C Diff toxin: NEGATIVE

## 2019-06-21 LAB — HEPARIN LEVEL (UNFRACTIONATED): Heparin Unfractionated: <0.1 IU/mL — ABNORMAL LOW (ref 0.30–0.70)

## 2019-06-21 LAB — ETHANOL: Alcohol, Ethyl (B): <10 mg/dL (ref <10)

## 2019-06-21 LAB — APTT: aPTT: 24 seconds (ref 24–36)

## 2019-06-21 LAB — LACTIC ACID, PLASMA: Lactic Acid, Venous: 1.2 mmol/L (ref 0.5–1.9)

## 2019-06-21 LAB — PHOSPHORUS: Phosphorus: 3.6 mg/dL (ref 2.5–4.6)

## 2019-06-21 LAB — LIPASE, BLOOD: Lipase: 25 U/L (ref 11–51)

## 2019-06-21 LAB — MAGNESIUM: Magnesium: 1.5 mg/dL — ABNORMAL LOW (ref 1.7–2.4)

## 2019-06-21 MED ORDER — HEPARIN BOLUS VIA INFUSION
5000.0000 [IU] | Freq: Once | INTRAVENOUS | Status: AC
  Administered 2019-06-21: 5000 [IU] via INTRAVENOUS
  Filled 2019-06-21: qty 5000

## 2019-06-21 MED ORDER — MORPHINE SULFATE (PF) 2 MG/ML IV SOLN
2.0000 mg | INTRAVENOUS | Status: DC | PRN
  Administered 2019-06-22 (×3): 2 mg via INTRAVENOUS
  Filled 2019-06-21 (×3): qty 1

## 2019-06-21 MED ORDER — KETOROLAC TROMETHAMINE 30 MG/ML IJ SOLN
INTRAMUSCULAR | Status: AC
  Administered 2019-06-21: 30 mg
  Filled 2019-06-21: qty 1

## 2019-06-21 MED ORDER — IOHEXOL 300 MG/ML  SOLN
100.0000 mL | Freq: Once | INTRAMUSCULAR | Status: AC | PRN
  Administered 2019-06-21: 100 mL via INTRAVENOUS

## 2019-06-21 MED ORDER — LIDOCAINE VISCOUS HCL 2 % MT SOLN
15.0000 mL | Freq: Once | OROMUCOSAL | Status: AC
  Administered 2019-06-21: 15 mL via OROMUCOSAL
  Filled 2019-06-21: qty 15

## 2019-06-21 MED ORDER — ALBUTEROL SULFATE (2.5 MG/3ML) 0.083% IN NEBU
3.0000 mL | INHALATION_SOLUTION | RESPIRATORY_TRACT | Status: DC | PRN
  Filled 2019-06-21: qty 3

## 2019-06-21 MED ORDER — HYDROMORPHONE HCL 1 MG/ML IJ SOLN
1.0000 mg | Freq: Once | INTRAMUSCULAR | Status: AC
  Administered 2019-06-21: 1 mg via INTRAVENOUS
  Filled 2019-06-21: qty 1

## 2019-06-21 MED ORDER — SODIUM CHLORIDE 0.9 % IV SOLN
INTRAVENOUS | Status: DC
  Administered 2019-06-21 – 2019-06-22 (×3): via INTRAVENOUS

## 2019-06-21 MED ORDER — PHENOL 1.4 % MT LIQD
1.0000 | OROMUCOSAL | Status: DC | PRN
  Administered 2019-06-21: 1 via OROMUCOSAL
  Filled 2019-06-21 (×2): qty 177

## 2019-06-21 MED ORDER — SODIUM CHLORIDE 0.9 % IV BOLUS
1000.0000 mL | Freq: Once | INTRAVENOUS | Status: AC
  Administered 2019-06-21: 1000 mL via INTRAVENOUS

## 2019-06-21 MED ORDER — HYOSCYAMINE SULFATE ER 0.375 MG PO TB12
0.3750 mg | ORAL_TABLET | Freq: Two times a day (BID) | ORAL | Status: DC
  Administered 2019-06-23 – 2019-06-25 (×5): 0.375 mg via ORAL
  Filled 2019-06-21 (×9): qty 1

## 2019-06-21 MED ORDER — HEPARIN (PORCINE) 25000 UT/250ML-% IV SOLN
1150.0000 [IU]/h | INTRAVENOUS | Status: DC
  Administered 2019-06-21 – 2019-06-22 (×3): 1300 [IU]/h via INTRAVENOUS
  Administered 2019-06-24: 1150 [IU]/h via INTRAVENOUS
  Filled 2019-06-21 (×4): qty 250

## 2019-06-21 MED ORDER — KETOROLAC TROMETHAMINE 15 MG/ML IJ SOLN
15.0000 mg | Freq: Four times a day (QID) | INTRAMUSCULAR | Status: DC | PRN
  Administered 2019-06-21 – 2019-06-24 (×7): 15 mg via INTRAVENOUS
  Filled 2019-06-21 (×6): qty 1

## 2019-06-21 MED ORDER — FAMOTIDINE 20 MG PO TABS: 20.0000 mg | ORAL_TABLET | Freq: Two times a day (BID) | ORAL | Status: DC

## 2019-06-21 MED ORDER — ENSURE ENLIVE PO LIQD: 237.0000 mL | Freq: Three times a day (TID) | ORAL | Status: DC

## 2019-06-21 MED ORDER — HYDROCORTISONE 10 MG PO TABS
20.0000 mg | ORAL_TABLET | Freq: Every day | ORAL | Status: DC
  Administered 2019-06-23 – 2019-06-25 (×3): 20 mg via ORAL
  Filled 2019-06-21 (×4): qty 2

## 2019-06-21 MED ORDER — PANTOPRAZOLE SODIUM 40 MG IV SOLR
40.0000 mg | INTRAVENOUS | Status: DC
  Administered 2019-06-21 – 2019-06-25 (×5): 40 mg via INTRAVENOUS
  Filled 2019-06-21 (×5): qty 40

## 2019-06-21 MED ORDER — PROMETHAZINE HCL 25 MG/ML IJ SOLN
25.0000 mg | Freq: Once | INTRAMUSCULAR | Status: AC
  Administered 2019-06-21: 09:00:00 25 mg via INTRAVENOUS
  Filled 2019-06-21: qty 1

## 2019-06-21 MED ORDER — RIVAROXABAN 20 MG PO TABS: 20.0000 mg | ORAL_TABLET | Freq: Every day | ORAL | Status: DC

## 2019-06-21 MED ORDER — PROMETHAZINE HCL 25 MG/ML IJ SOLN
25.0000 mg | Freq: Four times a day (QID) | INTRAMUSCULAR | Status: DC | PRN
  Administered 2019-06-21 – 2019-06-25 (×10): 25 mg via INTRAVENOUS
  Filled 2019-06-21 (×10): qty 1

## 2019-06-21 MED ORDER — HYDROMORPHONE HCL 1 MG/ML IJ SOLN
2.0000 mg | INTRAMUSCULAR | Status: AC
  Administered 2019-06-21: 2 mg via INTRAVENOUS
  Filled 2019-06-21: qty 2

## 2019-06-21 NOTE — ED Notes (Signed)
ED TO INPATIENT HANDOFF REPORT  ED Nurse Name and Phone #:    S Name/Age/Gender Joanna Hall 53 y.o. female Room/Bed: ED12A/ED12A  Code Status   Code Status: Full Code  Home/SNF/Other Home Patient oriented to: self, place, time and situation Is this baseline? Yes   Triage Complete: Triage complete  Chief Complaint vomiting  Triage Note Pt bought in via EMS for vomiting/ diarrhea and epigastric pain that started yesterday. Pt states that she had immunotherapy on Thursday.  Pt has metastatic carcinoma. Pt took zofran/ phenergan at home with no relief.    Allergies Allergies  Allergen Reactions  . Ketamine Anxiety and Other (See Comments)    Syncope episode/confusion     Level of Care/Admitting Diagnosis ED Disposition    ED Disposition Condition [facility] Area: [place] [[id number]]  Level of Care: Med-Surg [16]  Covid Evaluation: Confirmed COVID Negative  Diagnosis: Small bowel obstruction due to adhesions [facility]) [[id number]]  Admitting Physician: Eula Flax  Attending Physician: Rufina Falco ACHIENG [[id number]]  Estimated length of stay: past midnight tomorrow  Certification:: I certify this patient will need inpatient services for at least 2 midnights  PT Class (Do Not Modify): Inpatient [101]  PT Acc Code (Do Not Modify): Private [1]       B Medical/Surgery History Past Medical History:  Diagnosis Date  . Abdominal pain 06/10/2018  . Abnormal cervical Papanicolaou smear 09/18/2017  . Anxiety   . Aortic atherosclerosis ([place])   . Arthritis    neck and knees  . 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 Blood transfusion without reported diagnosis   . Cervical cancer ([place]) 09/2016   mets lung  . Chronic anal fissure   . Chronic diarrhea   . Dyspnea   . Erosive gastropathy 09/18/2017  . Factor V Leiden mutation ([place])   . Fecal incontinence   . Genital warts   . GERD  (gastroesophageal reflux disease)   . GI bleed 10/08/2018  . Heart murmur   . Hematochezia   . Hemorrhoids   . 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 Hepatitis, chronic ([place]) 05/05/2017  . History of cancer chemotherapy    completed 06/2017  . History of Clostridium difficile infection    while undergoing chemo.  Negative test 10/2017  . Ileocolic anastomotic leak   . 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 Intestinal infection due to Clostridium difficile 09/18/2017  . Macrocytic anemia with vitamin B12 deficiency   . Multiple gastric ulcers   . Nausea vomiting and diarrhea   . Pancolitis ([place]) 07/27/2018  . Perianal condylomata   . Pneumonia    History of  . Pulmonary nodules   . Rectal bleeding   . Small bowel obstruction ([place]) 08/2017  . Stiff neck    limited right turn  . Vitamin D deficiency    Past Surgical History:  Procedure Laterality Date  . CHOLECYSTECTOMY    . COLON SURGERY  08/2017   resection  . COLONOSCOPY WITH PROPOFOL N/A 12/20/2017   Procedure: COLONOSCOPY WITH PROPOFOL;  Surgeon: [name], MD;  Location: [facility] ENDOSCOPY;  Service: Gastroenterology;  Laterality: N/A;  . COLONOSCOPY WITH PROPOFOL N/A 07/30/2018   Procedure: COLONOSCOPY WITH PROPOFOL;  Surgeon: [name], MD;  Location: [facility] ENDOSCOPY;  Service: Gastroenterology;  Laterality: N/A;  . COLONOSCOPY WITH  PROPOFOL N/A 10/10/2018   Procedure: COLONOSCOPY WITH PROPOFOL;  Surgeon: Lucilla Lame, MD;  Location: [facility] ENDOSCOPY;  Service: Endoscopy;  Laterality: N/A;  . DIAGNOSTIC LAPAROSCOPY    . ESOPHAGOGASTRODUODENOSCOPY (EGD) WITH PROPOFOL N/A 12/20/2017   Procedure: ESOPHAGOGASTRODUODENOSCOPY (EGD) WITH PROPOFOL;  Surgeon: [name], MD;  Location: [place];  Service: Gastroenterology;  Laterality: N/A;  . ESOPHAGOGASTRODUODENOSCOPY (EGD) WITH PROPOFOL  07/30/2018   Procedure: ESOPHAGOGASTRODUODENOSCOPY (EGD) WITH PROPOFOL;   Surgeon: [name], MD;  Location: [facility] ENDOSCOPY;  Service: Gastroenterology;;  . Otho Darner SIGMOIDOSCOPY N/A 11/21/2018   Procedure: FLEXIBLE SIGMOIDOSCOPY;  Surgeon: [name], MD;  Location: [facility] ENDOSCOPY;  Service: Gastroenterology;  Laterality: N/A;  . LAPAROTOMY N/A 08/31/2017   Procedure: EXPLORATORY LAPAROTOMY for SBO, ileocolectomy, removal of piece of uterine wall;  Surgeon: [name], MD;  Location: [facility] ORS;  Service: General;  Laterality: N/A;  . LASER ABLATION CONDOLAMATA N/A 02/22/2018   Procedure: LASER ABLATION/REMOVAL OF KZSWFUXNATF AROUND ANUS AND VAGINA;  Surgeon: Michael Boston, MD;  Location: [place];  Service: General;  Laterality: N/A;  . OOPHORECTOMY    . PORTA CATH INSERTION N/A 05/13/2018   Procedure: PORTA CATH INSERTION;  Surgeon: Katha Cabal, MD;  Location: [place] CV LAB;  Service: Cardiovascular;  Laterality: N/A;  . SMALL INTESTINE SURGERY    . TANDEM RING INSERTION     x3  . THORACOTOMY    . TOTAL KNEE ARTHROPLASTY Left 04/24/2018   Procedure: TOTAL KNEE ARTHROPLASTY;  Surgeon: Lovell Sheehan, MD;  Location: [facility] ORS;  Service: Orthopedics;  Laterality: Left;     A IV Location/Drains/Wounds Patient Lines/Drains/Airways Status   Active Line/Drains/Airways    Name:   Placement date:   Placement time:   Site:   Days:   Implanted Port 06/19/19   06/19/19    0820    -   2   NG/OG Tube Nasogastric 16 Fr. Left nare Aucultation;Xray Documented cm marking at nare/ corner of mouth 63 cm   06/21/19    1304    Left nare   less than 1          Intake/Output Last 24 hours  Intake/Output Summary (Last 24 hours) at 06/21/2019 1629 Last data filed at 06/21/2019 1522 Gross per 24 hour  Intake 1000 ml  Output 600 ml  Net 400 ml    Labs/Imaging Results for orders placed or performed during the hospital encounter of 06/21/19 (from the past 48 hour(s))  Comprehensive metabolic panel     Status: Abnormal    Collection Time: 06/21/19  8:39 AM  Result Value Ref Range   Sodium 137 135 - 145 mmol/L   Potassium 3.7 3.5 - 5.1 mmol/L   Chloride 103 98 - 111 mmol/L   CO2 23 22 - 32 mmol/L   Glucose, Bld 115 (H) 70 - 99 mg/dL   BUN 18 6 - 20 mg/dL   Creatinine, Ser 0.75 0.44 - 1.00 mg/dL   Calcium 9.5 8.9 - 10.3 mg/dL   Total Protein 7.5 6.5 - 8.1 g/dL   Albumin 3.8 3.5 - 5.0 g/dL   AST 40 15 - 41 U/L   ALT 72 (H) 0 - 44 U/L   Alkaline Phosphatase 145 (H) 38 - 126 U/L   Total Bilirubin 0.4 0.3 - 1.2 mg/dL   GFR calc non Af Amer >60 >60 mL/min   GFR calc Af Amer >60 >60 mL/min   Anion gap 11 5 - 15  Comment: Performed at [facility], [address] [place], [place] [address]  Ethanol     Status: None   Collection Time: 06/21/19  8:39 AM  Result Value Ref Range   Alcohol, Ethyl (B) <10 <10 mg/dL    Comment: (NOTE) Lowest detectable limit for serum alcohol is 10 mg/dL. For medical purposes only. Performed at [facility], [address] [place], [place] [address]   CBC with Differential     Status: Abnormal   Collection Time: 06/21/19  8:39 AM  Result Value Ref Range   WBC 7.7 4.0 - 10.5 K/uL   RBC 3.67 (L) 3.87 - 5.11 MIL/uL   Hemoglobin 12.1 12.0 - 15.0 g/dL   HCT 36.5 36.0 - 46.0 %   MCV 99.5 80.0 - 100.0 fL   MCH 33.0 26.0 - 34.0 pg   MCHC 33.2 30.0 - 36.0 g/dL   RDW 13.7 11.5 - 15.5 %   Platelets 193 150 - 400 K/uL   nRBC 0.0 0.0 - 0.2 %   Neutrophils Relative % 76 %   Neutro Abs 5.9 1.7 - 7.7 K/uL   Lymphocytes Relative 18 %   Lymphs Abs 1.4 0.7 - 4.0 K/uL   Monocytes Relative 5 %   Monocytes Absolute 0.4 0.1 - 1.0 K/uL   Eosinophils Relative 0 %   Eosinophils Absolute 0.0 0.0 - 0.5 K/uL   Basophils Relative 0 %   Basophils Absolute 0.0 0.0 - 0.1 K/uL   Immature Granulocytes 1 %   Abs Immature Granulocytes 0.04 0.00 - 0.07 K/uL    Comment: Performed at [facility], [address] [place], [place] [address]  Lipase, blood     Status:  None   Collection Time: 06/21/19  8:39 AM  Result Value Ref Range   Lipase 25 11 - 51 U/L    Comment: Performed at [facility], [address] [place], [place] [address]  Phosphorus     Status: None   Collection Time: 06/21/19  8:39 AM  Result Value Ref Range   Phosphorus 3.6 2.5 - 4.6 mg/dL    Comment: Performed at [facility], [address], [place], [place] [address]  Magnesium     Status: Abnormal   Collection Time: 06/21/19  8:39 AM  Result Value Ref Range   Magnesium 1.5 (L) 1.7 - 2.4 mg/dL    Comment: Performed at [facility], [address] [place], [place] [address]  Lactic acid, plasma     Status: None   Collection Time: 06/21/19  8:42 AM  Result Value Ref Range   Lactic Acid, Venous 1.2 0.5 - 1.9 mmol/L    Comment: Performed at [facility], [address] [place],  [address]  SARS Coronavirus 2 [facility] order, Performed in [facility] hospital lab) Nasopharyngeal Nasopharyngeal Swab     Status: None   Collection Time: 06/21/19  8:42 AM   Specimen: Nasopharyngeal Swab  Result Value Ref Range   SARS Coronavirus 2 NEGATIVE NEGATIVE    Comment: (NOTE) If result is NEGATIVE SARS-CoV-2 target nucleic acids are NOT DETECTED. The SARS-CoV-2 RNA is generally detectable in upper and lower  respiratory specimens during the acute phase of infection. The lowest  concentration of SARS-CoV-2 viral copies this assay can detect is 250  copies / mL. A negative result does not preclude SARS-CoV-2 infection  and should not be used as the sole basis for treatment or other  patient management decisions.  A negative  result may occur with  improper specimen collection / handling, submission of specimen other  than nasopharyngeal swab, presence of viral mutation(s) within the  areas targeted by this assay, and inadequate number of viral copies  (<250 copies / mL). A negative result must be combined with clinical  observations, patient  history, and epidemiological information. If result is POSITIVE SARS-CoV-2 target nucleic acids are DETECTED. The SARS-CoV-2 RNA is generally detectable in upper and lower  respiratory specimens dur ing the acute phase of infection.  Positive  results are indicative of active infection with SARS-CoV-2.  Clinical  correlation with patient history and other diagnostic information is  necessary to determine patient infection status.  Positive results do  not rule out bacterial infection or co-infection with other viruses. If result is PRESUMPTIVE POSTIVE SARS-CoV-2 nucleic acids MAY BE PRESENT.   A presumptive positive result was obtained on the submitted specimen  and confirmed on repeat testing.  While 2019 novel coronavirus  (SARS-CoV-2) nucleic acids may be present in the submitted sample  additional confirmatory testing may be necessary for epidemiological  and / or clinical management purposes  to differentiate between  SARS-CoV-2 and other Sarbecovirus currently known to infect humans.  If clinically indicated additional testing with an alternate test  methodology [phone]) is advised. The SARS-CoV-2 RNA is generally  detectable in upper and lower respiratory sp ecimens during the acute  phase of infection. The expected result is Negative. Fact Sheet for Patients:  StrictlyIdeas.no Fact Sheet for Healthcare Providers: BankingDealers.co.za This test is not yet approved or cleared by the [place] FDA and has been authorized for detection and/or diagnosis of SARS-CoV-2 by FDA under an Emergency Use Authorization (EUA).  This EUA will remain in effect (meaning this test can be used) for the duration of the COVID-19 declaration under Section 564(b)(1) of the Act, 21 U.S.C. section 360bbb-3(b)(1), unless the authorization is terminated or revoked sooner. Performed at [facility], [address] [place], [place]  [address]   Urinalysis, Complete w Microscopic     Status: Abnormal   Collection Time: 06/21/19 12:58 PM  Result Value Ref Range   Color, Urine YELLOW (A) YELLOW   APPearance CLEAR (A) CLEAR   Specific Gravity, Urine >1.046 (H) 1.005 - 1.030   pH 6.0 5.0 - 8.0   Glucose, UA NEGATIVE NEGATIVE mg/dL   Hgb urine dipstick NEGATIVE NEGATIVE   Bilirubin Urine NEGATIVE NEGATIVE   Ketones, ur NEGATIVE NEGATIVE mg/dL   Protein, ur NEGATIVE NEGATIVE mg/dL   Nitrite NEGATIVE NEGATIVE   Leukocytes,Ua NEGATIVE NEGATIVE   RBC / HPF 0-5 0 - 5 RBC/hpf   WBC, UA 0-5 0 - 5 WBC/hpf   Bacteria, UA NONE SEEN NONE SEEN   Squamous Epithelial / LPF NONE SEEN 0 - 5   Mucus PRESENT     Comment: Performed at [facility], [address], [place], [place] [address]   *Note: Due to a large number of results and/or encounters for the requested time period, some results have not been displayed. A complete set of results can be found in Results Review.   Ct Abdomen Pelvis W Contrast  Result Date: 06/21/2019 CLINICAL DATA:  Onset diarrhea and epigastric pain yesterday. History of metastatic cervical carcinoma. EXAM: CT ABDOMEN AND PELVIS WITH CONTRAST TECHNIQUE: Multidetector CT imaging of the abdomen and pelvis was performed using the standard protocol following bolus administration of intravenous contrast. CONTRAST:  100 mL OMNIPAQUE IOHEXOL 300 MG/ML  SOLN COMPARISON:  CT abdomen  and pelvis 06/07/2019 and CT chest, abdomen and pelvis 04/21/2019. FINDINGS: Lower chest: No pleural or pericardial effusion. Multiple bilateral lower lobe pulmonary nodules consistent with metastatic carcinoma are unchanged. Hepatobiliary: No focal liver lesion. The patient is status post cholecystectomy. Mild intra and extrahepatic biliary ductal prominence is unchanged. Pancreas: Unremarkable. No pancreatic ductal dilatation or surrounding inflammatory changes. Spleen: Normal in size without focal abnormality. Adrenals/Urinary Tract:  Single punctate nonobstructing stones in the kidneys are unchanged. No hydronephrosis or ureteral stone. Urinary bladder is unremarkable. Adrenal glands appear normal. Stomach/Bowel: The patient is status post right hemicolectomy. The anastomosis is normal in appearance. There is fecalization of small bowel contents in loops in the pelvis consistent with slow transit, new since the prior exams. A transition point is seen in the right lower quadrant of the abdomen on images 65-68 where more distal small bowel loops are completely decompressed. No pneumatosis, portal venous gas or free intraperitoneal air. The sigmoid colon is incompletely distended with thickening of the walls, unchanged. The stomach is unremarkable. Vascular/Lymphatic: Aortic atherosclerosis. No enlarged abdominal or pelvic lymph nodes. Reproductive: Status post hysterectomy. No adnexal masses. Other: None. Musculoskeletal: No acute or focal abnormality. Convex left scoliosis and multilevel degenerative change noted. IMPRESSION: The examination is positive for small bowel obstruction which appears to be due to adhesions with a transition point in the right lower quadrant. No CT signs of bowel ischemia. No change in multiple pulmonary nodules consistent with metastatic disease. No change in chronic wall thickening of the sigmoid colon compatible chronic colitis likely secondary to prior radiation therapy. Electronically Signed   By: Inge Rise M.D.   On: 06/21/2019 10:36   Dg Abd Portable 1 View  Result Date: 06/21/2019 CLINICAL DATA:  NG tube placement. History of cervical and lung cancer. EXAM: PORTABLE ABDOMEN - 1 VIEW COMPARISON:  CT abdomen pelvis-06/21/2019 FINDINGS: Enteric tube tip and side port project over the mid body of the stomach. No pneumoperitoneum, pneumatosis or portal venous gas. Small amount of excreted contrast is seen within the bilateral renal collecting systems as a sequela of recent intravenous contrasted  examination. Limited visualization of lower thorax demonstrates scattered bilateral pulmonary nodules compatible with known metastatic disease. Port a catheter tip projects over the superior cavoatrial junction. Post cholecystectomy. Mild-to-moderate scoliotic curvature of the thoracolumbar spine. IMPRESSION: Enteric tube tip and side port project over the mid body of the stomach. Electronically Signed   By: Sandi Mariscal M.D.   On: 06/21/2019 13:30    Pending Labs Unresulted Labs (From admission, onward)    Start     Ordered   06/21/19 1354  C difficile quick scan w PCR reflex  (C Difficile quick screen w PCR reflex panel)  Once, for 24 hours,   STAT     06/21/19 1353          Vitals/Pain Today's Vitals   06/21/19 1415 06/21/19 1430 06/21/19 1517 06/21/19 1522  BP:   (!) 133/95   Pulse:  85 87   Resp: 18 18 17    Temp:   98.6 F (37 C)   TempSrc:   Oral   SpO2:  99% 100% 100%  Weight:      Height:      PainSc:   3      Isolation Precautions Enteric precautions (UV disinfection)  Medications Medications  albuterol (PROVENTIL) (2.5 MG/3ML) 0.083% nebulizer solution 3 mL (has no administration in time range)  feeding supplement (ENSURE ENLIVE) (ENSURE ENLIVE) liquid 237 mL (237 mLs  Oral Not Given 06/21/19 1532)  hydrocortisone (CORTEF) tablet 20 mg (20 mg Oral Not Given 06/21/19 1529)  hyoscyamine (LEVBID) 0.375 MG 12 hr tablet 0.375 mg (0.375 mg Oral Not Given 06/21/19 1529)  rivaroxaban (XARELTO) tablet 20 mg (has no administration in time range)  pantoprazole (PROTONIX) injection 40 mg (40 mg Intravenous Given 06/21/19 1524)  promethazine (PHENERGAN) injection 25 mg (25 mg Intravenous Given 06/21/19 1524)  0.9 %  sodium chloride infusion ( Intravenous New Bag/Given 06/21/19 1537)  ketorolac (TORADOL) 15 MG/ML injection 15 mg (15 mg Intravenous Given 06/21/19 1538)  sodium chloride 0.9 % bolus 1,000 mL (0 mLs Intravenous Stopped 06/21/19 1201)  HYDROmorphone (DILAUDID) injection 1 mg (1 mg  Intravenous Given 06/21/19 0849)  promethazine (PHENERGAN) injection 25 mg (25 mg Intravenous Given 06/21/19 0900)  iohexol (OMNIPAQUE) 300 MG/ML solution 100 mL (100 mLs Intravenous Contrast Given 06/21/19 0957)  HYDROmorphone (DILAUDID) injection 1 mg (1 mg Intravenous Given 06/21/19 0922)  HYDROmorphone (DILAUDID) injection 2 mg (2 mg Intravenous Given 06/21/19 1215)  lidocaine (XYLOCAINE) 2 % viscous mouth solution 15 mL (15 mLs Mouth/Throat Given 06/21/19 1245)  ketorolac (TORADOL) 30 MG/ML injection (30 mg  Given 06/21/19 1537)    Mobility walks Moderate fall risk   Focused Assessments GI    R Recommendations: See Admitting Provider Note  Report given to:   Additional Notes:

## 2019-06-21 NOTE — ED Notes (Addendum)
pt vomiting at this time, dr stafford notified.

## 2019-06-21 NOTE — Progress Notes (Signed)
ANTICOAGULATION CONSULT NOTE - Initial Consult  Pharmacy Consult for Heparin  Indication: DVT  Allergies  Allergen Reactions  . Ketamine Anxiety and Other (See Comments)    Syncope episode/confusion     Patient Measurements: Height: 5' 8"  (172.7 cm) Weight: 166 lb 3.2 oz (75.4 kg) IBW/kg (Calculated) : 63.9 Heparin Dosing Weight: 75.4 kg   Vital Signs: Temp: 97.9 F (36.6 C) (09/05 1706) Temp Source: Oral (09/05 1706) BP: 162/85 (09/05 1706) Pulse Rate: 71 (09/05 1706)  Labs: Recent Labs    06/19/19 0805 06/21/19 0839  HGB 11.5* 12.1  HCT 35.4* 36.5  PLT 165 193  CREATININE 0.68 0.75    Estimated Creatinine Clearance: 83.9 mL/min (by C-G formula based on SCr of 0.75 mg/dL).   Medical History: Past Medical History:  Diagnosis Date  . Abdominal pain 06/10/2018  . Abnormal cervical Papanicolaou smear 09/18/2017  . Anxiety   . Aortic atherosclerosis ([place])   . Arthritis    neck and knees  . 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 Blood transfusion without reported diagnosis   . Cervical cancer ([place]) 09/2016   mets lung  . Chronic anal fissure   . Chronic diarrhea   . Dyspnea   . Erosive gastropathy 09/18/2017  . Factor V Leiden mutation ([place])   . Fecal incontinence   . Genital warts   . GERD (gastroesophageal reflux disease)   . GI bleed 10/08/2018  . Heart murmur   . Hematochezia   . Hemorrhoids   . 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 Hepatitis, chronic ([place]) 05/05/2017  . History of cancer chemotherapy    completed 06/2017  . History of Clostridium difficile infection    while undergoing chemo.  Negative test 10/2017  . Ileocolic anastomotic leak   . Infarction of kidney ([place]) left kidney   and uterus  . Intestinal infection due to Clostridium difficile 09/18/2017  . Macrocytic anemia with vitamin B12 deficiency   . Multiple gastric ulcers   . Nausea vomiting and diarrhea   . Pancolitis ([place]) 07/27/2018   . Perianal condylomata   . Pneumonia    History of  . Pulmonary nodules   . Rectal bleeding   . Small bowel obstruction ([place]) 08/2017  . Stiff neck    limited right turn  . Vitamin D deficiency     Medications:  Medications Prior to Admission  Medication Sig Dispense Refill Last Dose  . amitriptyline (ELAVIL) 75 MG tablet Take 1 tablet (75 mg total) by mouth at bedtime. 90 tablet 1 06/20/2019 at Unknown time  . Calcium Carb-Cholecalciferol (CALCIUM 500 +D) 500-400 MG-UNIT TABS Take 2 tablets by mouth daily.   06/20/2019 at Unknown time  . diphenoxylate-atropine (LOMOTIL) 2.5-0.025 MG tablet TAKE (2) TABLETS FOUR TIMES DAILY. 240 tablet 0 06/20/2019 at Unknown time  . famotidine (PEPCID) 20 MG tablet Take 1 tablet (20 mg total) by mouth 2 (two) times daily for 5 days. 10 tablet 0 06/20/2019 at Unknown time  . feeding supplement, ENSURE ENLIVE, (ENSURE ENLIVE) LIQD Take 237 mLs by mouth 3 (three) times daily between meals. 237 mL 12 unknown at unknown  . hydrocortisone (CORTEF) 20 MG tablet Take 1 tablet (20 mg total) by mouth daily. 30 tablet 0 06/20/2019 at Unknown time  . hydrOXYzine (ATARAX/VISTARIL) 10 MG tablet Take 1 tablet (10 mg total) by mouth 3 (three) times daily as needed. 60 tablet 0 prn  at prn  . hyoscyamine (LEVBID) 0.375 MG 12 hr tablet Take 1 tablet (0.375 mg total)by mouth 2 (two) times daily. 60 tablet 0 06/20/2019 at Unknown time  . Multiple Vitamins-Minerals (MULTIVITAMIN WITH MINERALS) tablet Take 1 tablet by mouth daily. 30 tablet 0 unknown at unknown  . ondansetron (ZOFRAN ODT) 4 MG disintegrating tablet Take 1 tablet (4 mg total) by mouth every 8 (eight) hours as needed for nausea or vomiting. 30 tablet 2 prn at prn  . oxyCODONE (OXYCONTIN) 15 mg 12 hr tablet Take 1 tablet (15 mg total) by mouth every 12 (twelve) hours. 60 tablet 0 06/20/2019 at Unknown time  . pantoprazole (PROTONIX) 40 MG tablet Take 1 tablet (40 mg total) by mouth daily. 90 tablet 1 06/20/2019 at Unknown time   . potassium chloride SA (K-DUR) 20 MEQ tablet Take 1 tablet (20 mEq total) by mouth daily as needed (if having nausea/vomiting- to supplements for lost potassium). 30 tablet 0 prn at prn  . promethazine (PHENERGAN) 25 MG tablet Take 1 tablet (25 mg total) by mouth every 8 (eight) hours as needed for nausea or vomiting. 30 tablet 3 prn at prn  . rivaroxaban (XARELTO) 20 MG TABS tablet Take 1 tablet (20 mg total) by mouth daily with supper. (Patient taking differently: Take 20 mg by mouth daily with supper. Takes in the morning) 30 tablet 6 06/20/2019 at Unknown time  . VENTOLIN HFA 108 (90 Base) MCG/ACT inhaler Inhale 1-2 puffs into the lungs every 4 (four) hours as needed for shortness of breath. 1 Inhaler 1 prn at prn  . Oxycodone HCl 10 MG TABS Take 1 tablet (10 mg total) by mouth every 6 (six) hours as needed (for breakthrough pain). 120 tablet 0 prn at prn    Assessment: Pharmacy consulted to dose heparin in this 53 year old female admitted with DVT.  Pt was on Xarelto 20 mg PO daily PTA, last dose was on 9/4 in AM.  CrCl = 83.9 ml/min  Goal of Therapy:  Heparin level 0.3-0.7 units/ml aPTT 66 - 102  seconds Monitor platelets by anticoagulation protocol: Yes   Plan:  Will order heparin 5000 units IV X 1 bolus and begin heparin drip @ 1300 units/hr. Will draw aPTT 6 hrs after start of drip. Will use aPTT to guide dosing until aPTT and HL are both therapeutic.  Will check HL on 9/6 with AM labs.   Emine Lopata D 06/21/2019,5:53 PM

## 2019-06-21 NOTE — ED Notes (Signed)
Assumed acre of patient patient very anxious and angry, very rude and non cooperative with nursing care. C/O of nausea. NG tube to wall suction re-adjusted and placement check, Positive placement noted. Suction placed on intermediate working well now, 600 ml of greenish stomach output noted. Vss, meds given as ordered.

## 2019-06-21 NOTE — Progress Notes (Signed)
   06/21/19 1500  Clinical Encounter Type  Visited With Patient  Visit Type Initial;Follow-up;Psychological support;Spiritual support;ED;Other (Comment)  Referral From Physician  Consult/Referral To Chaplain  Spiritual Encounters  Spiritual Needs Emotional;Grief support;Other (Comment)  Stress Factors  Patient Stress Factors Exhausted;Family relationships;Health changes;Loss;Loss of control;Major life changes;Other (Comment)

## 2019-06-21 NOTE — ED Notes (Signed)
Patient oob to toilet with diarrhea. Specimen obtained and sent. floor called for report

## 2019-06-21 NOTE — ED Notes (Signed)
Notified provider about patient being observation/ boarder.

## 2019-06-21 NOTE — Progress Notes (Signed)
Chaplain attempted a follow up twice other team members were attending to pt.

## 2019-06-21 NOTE — ED Notes (Signed)
Stool sample obtained and sent. Patient off unit to floor.

## 2019-06-21 NOTE — H&P (Addendum)
° °[facility] ° ° °PATIENT NAME: Joanna Hall   ° °MR#:  [id number] ° °DATE OF BIRTH:  03/26/1966 ° °DATE OF ADMISSION:  06/21/2019 ° °PRIMARY CARE PHYSICIAN: Lada, Melinda P, MD  ° °REQUESTING/REFERRING PHYSICIAN: Stafford, Philip, MD ° °CHIEF COMPLAINT:  ° °Chief Complaint  °Patient presents with  °• Emesis  °• Diarrhea  ° ° °HISTORY OF PRESENT ILLNESS:  °Olina D Bruns 53 y.o.  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 °  °Patient has had multiple admission for dehydration, hypomagnesium and hypokalemia secondary to profuse, watery, intermittently diarrhea, nausea /voming and abdominal pain on 8/22 and  most recently on 8/29. 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 She was thus started on Cipro and metronidazole for C. difficile treatment in the outpatient setting. Cdiff and GI panel at that time was negative there abx was discontinued. The patient’s symptoms has worsened despite  treatment. She also complained of diffuse abdominal pain worse since yesterday, straining to have a bowel movement and diarrhea with streaks of blood, and poor oral intake. Two days prior to onset of her symptoms, she had received her third cycle of pembrolizumab (Keytruda) therapy.  Patient denies fevers or chills, chest pain, diaphoresis or any other symptoms.  ° °On arrival to the ED, she was afebrile with blood pressure 172/110 mm Hg and pulse rate 94 beats/min. There were no focal neurological deficits; she was alert and oriented x4, and he did not demonstrate any memory deficits.  Initial labs revealed glucose 115, ALT 72, alk phos 145,  CBC unremarkable, lipase, 25, mag 1.5, COVID negative.  CT abdomen pelvis shows small bowel obstruction likely due to addition without bowel ischemia and colonic wall thickening of the sigmoid and colon compatible with chronic colitis.  Given this finding general surgery was consulted for further evaluation and patient will be admitted for medical management and to hospitalist service. ° °PAST MEDICAL HISTORY:  ° °Past Medical History:  °Diagnosis Date  °• Abdominal pain 06/10/2018  °• Abnormal cervical Papanicolaou smear 09/18/2017  °• Anxiety   °• Aortic atherosclerosis ([facility])   °• Arthritis   ° neck and knees  °• Blood clots in brain   ° both lungs and right kidney  °• Blood transfusion without reported diagnosis   °• Cervical cancer ([facility]) 09/2016  ° mets lung  °• Chronic anal fissure   °• Chronic diarrhea   °• Dyspnea   °• Erosive gastropathy 09/18/2017  °• Factor V Leiden mutation ([facility])   °• Fecal incontinence   °• Genital warts   °• GERD (gastroesophageal reflux disease)   °• GI bleed 10/08/2018  °• Heart murmur   °• Hematochezia   °• Hemorrhoids   °• Hepatitis C   ° Chronic, after IV drug abuse about 20 years ago  °• Hepatitis, chronic ([facility]) 05/05/2017  °• History of cancer chemotherapy   ° completed 06/2017  °• History of Clostridium difficile infection   ° while undergoing chemo.  Negative test 10/2017  °• Ileocolic anastomotic leak   °• Infarction of kidney ([facility]) left kidney  ° and uterus  °• Intestinal infection due to Clostridium difficile 09/18/2017  °• Macrocytic anemia with vitamin   B12 deficiency   °• Multiple gastric ulcers   °• Nausea vomiting and diarrhea   °• Pancolitis ([facility]) 07/27/2018  °• Perianal condylomata   °• Pneumonia   ° History of  °• Pulmonary nodules   °• Rectal bleeding   °• Small bowel obstruction ([facility]) 08/2017  °• Stiff neck   ° limited right turn  °• Vitamin D deficiency   ° ° °PAST SURGICAL HISTORY:  ° °Past Surgical History:  °Procedure Laterality Date  °• CHOLECYSTECTOMY    °• COLON  SURGERY  08/2017  ° resection  °• COLONOSCOPY WITH PROPOFOL N/A 12/20/2017  ° Procedure: COLONOSCOPY WITH PROPOFOL;  Surgeon: Vanga, Rohini Reddy, MD;  Location: [facility];  Service: Gastroenterology;  Laterality: N/A;  °• COLONOSCOPY WITH PROPOFOL N/A 07/30/2018  ° Procedure: COLONOSCOPY WITH PROPOFOL;  Surgeon: Vanga, Rohini Reddy, MD;  Location: [facility];  Service: Gastroenterology;  Laterality: N/A;  °• COLONOSCOPY WITH PROPOFOL N/A 10/10/2018  ° Procedure: COLONOSCOPY WITH PROPOFOL;  Surgeon: Wohl, Darren, MD;  Location: [facility];  Service: Endoscopy;  Laterality: N/A;  °• DIAGNOSTIC LAPAROSCOPY    °• ESOPHAGOGASTRODUODENOSCOPY (EGD) WITH PROPOFOL N/A 12/20/2017  ° Procedure: ESOPHAGOGASTRODUODENOSCOPY (EGD) WITH PROPOFOL;  Surgeon: Vanga, Rohini Reddy, MD;  Location: [facility];  Service: Gastroenterology;  Laterality: N/A;  °• ESOPHAGOGASTRODUODENOSCOPY (EGD) WITH PROPOFOL  07/30/2018  ° Procedure: ESOPHAGOGASTRODUODENOSCOPY (EGD) WITH PROPOFOL;  Surgeon: Vanga, Rohini Reddy, MD;  Location: [facility];  Service: Gastroenterology;;  °• FLEXIBLE SIGMOIDOSCOPY N/A 11/21/2018  ° Procedure: FLEXIBLE SIGMOIDOSCOPY;  Surgeon: Vanga, Rohini Reddy, MD;  Location: [facility];  Service: Gastroenterology;  Laterality: N/A;  °• LAPAROTOMY N/A 08/31/2017  ° Procedure: EXPLORATORY LAPAROTOMY for SBO, ileocolectomy, removal of piece of uterine wall;  Surgeon: Piscoya, Jose, MD;  Location: [facility];  Service: General;  Laterality: N/A;  °• LASER ABLATION CONDOLAMATA N/A 02/22/2018  ° Procedure: LASER ABLATION/REMOVAL OF CONDOLAMATA AROUND ANUS AND VAGINA;  Surgeon: Gross, Steven, MD;  Location: [facility];  Service: General;  Laterality: N/A;  °• OOPHORECTOMY    °• PORTA CATH INSERTION N/A 05/13/2018  ° Procedure: PORTA CATH INSERTION;  Surgeon: Schnier, Gregory G, MD;  Location: [facility];  Service: Cardiovascular;  Laterality: N/A;  °• SMALL INTESTINE SURGERY    °• TANDEM RING  INSERTION    ° x3  °• THORACOTOMY    °• TOTAL KNEE ARTHROPLASTY Left 04/24/2018  ° Procedure: TOTAL KNEE ARTHROPLASTY;  Surgeon: Bowers, James R, MD;  Location: [facility];  Service: Orthopedics;  Laterality: Left;  ° ° °SOCIAL HISTORY:  ° °Social History  ° °Tobacco Use  °• Smoking status: Former Smoker  °  Quit date: 10/16/2006  °  Years since quitting: 12.6  °• Smokeless tobacco: Never Used  °Substance Use Topics  °• Alcohol use: Not Currently  °  Frequency: Never  °  Comment: seldom  ° ° °FAMILY HISTORY:  ° °Family History  °Problem Relation Age of Onset  °• Hypertension Father   °• Diabetes Father   °• Alcohol abuse Daughter   °• Hypertension Maternal Grandmother   °• Diabetes Maternal Grandmother   °• Diabetes Paternal Grandmother   °• Hypertension Paternal Grandmother   ° ° °DRUG ALLERGIES:  ° °Allergies  °Allergen Reactions  °• Ketamine Anxiety and Other (See Comments)  °  Syncope episode/confusion °  ° ° °REVIEW OF SYSTEMS:  ° °Review of Systems  °Constitutional: Positive for malaise/fatigue. Negative for chills, fever and weight loss.  °HENT: Negative for congestion, hearing loss   loss and sore throat.   Eyes: Negative for blurred vision and double vision.  Respiratory: Negative for cough, shortness of breath and wheezing.   Cardiovascular: Negative for chest pain, palpitations, orthopnea and leg swelling.  Gastrointestinal: Positive for abdominal pain, blood in stool, constipation, diarrhea, nausea and vomiting.  Genitourinary: Negative for dysuria and urgency.  Musculoskeletal: Negative for myalgias.  Skin: Negative for rash.  Neurological: Negative for dizziness, sensory change, speech change, focal weakness and headaches.  Psychiatric/Behavioral: Negative for depression.    MEDICATIONS AT HOME:   Prior to Admission medications   Medication Sig Start Date End Date Taking? Authorizing Provider  amitriptyline (ELAVIL) 75 MG tablet Take 1 tablet (75 mg total) by mouth at bedtime. 05/06/19 08/04/19 Yes  Vanga, Tally Due, MD  Calcium Carb-Cholecalciferol (CALCIUM 500 +D) 500-400 MG-UNIT TABS Take 2 tablets by mouth daily.   Yes [provider]  diphenoxylate-atropine (LOMOTIL) 2.5-0.025 MG tablet TAKE (2) TABLETS FOUR TIMES DAILY. 04/21/19  Yes Vanga, Tally Due, MD  famotidine (PEPCID) 20 MG tablet Take 1 tablet (20 mg total) by mouth 2 (two) times daily for 5 days. 05/23/19 06/21/19 Yes Duffy Bruce, MD  feeding supplement, ENSURE ENLIVE, (ENSURE ENLIVE) LIQD Take 237 mLs by mouth 3 (three) times daily between meals. 05/22/19  Yes Epifanio Lesches, MD  hydrocortisone (CORTEF) 20 MG tablet Take 1 tablet (20 mg total) by mouth daily. 06/10/19  Yes Gladstone Lighter, MD  hydrOXYzine (ATARAX/VISTARIL) 10 MG tablet Take 1 tablet (10 mg total) by mouth 3 (three) times daily as needed. 06/13/19  Yes Cammie Sickle, MD  hyoscyamine (LEVBID) 0.375 MG 12 hr tablet Take 1 tablet (0.375 mg total)by mouth 2 (two) times daily. 01/03/19  Yes Vanga, Tally Due, MD  Multiple Vitamins-Minerals (MULTIVITAMIN WITH MINERALS) tablet Take 1 tablet by mouth daily. 08/01/18 08/01/19 Yes Wieting, Richard, MD  ondansetron (ZOFRAN ODT) 4 MG disintegrating tablet Take 1 tablet (4 mg total) by mouth every 8 (eight) hours as needed for nausea or vomiting. 03/20/19  Yes Verlon Au, NP  oxyCODONE (OXYCONTIN) 15 mg 12 hr tablet Take 1 tablet (15 mg total) by mouth every 12 (twelve) hours. 05/29/19  Yes Cammie Sickle, MD  pantoprazole (PROTONIX) 40 MG tablet Take 1 tablet (40 mg total) by mouth daily. 03/18/19  Yes Cammie Sickle, MD  potassium chloride SA (K-DUR) 20 MEQ tablet Take 1 tablet (20 mEq total) by mouth daily as needed (if having nausea/vomiting- to supplements for lost potassium). 06/10/19  Yes Gladstone Lighter, MD  promethazine (PHENERGAN) 25 MG tablet Take 1 tablet (25 mg total) by mouth every 8 (eight) hours as needed for nausea or vomiting. 03/05/19  Yes Earlie Server, MD  rivaroxaban  (XARELTO) 20 MG TABS tablet Take 1 tablet (20 mg total) by mouth daily with supper. Patient taking differently: Take 20 mg by mouth daily with supper. Takes in the morning 05/05/19  Yes Brahmanday, Elisha Headland, MD  VENTOLIN HFA 108 (90 Base) MCG/ACT inhaler Inhale 1-2 puffs into the lungs every 4 (four) hours as needed for shortness of breath. 02/19/19  Yes Verlon Au, NP  Oxycodone HCl 10 MG TABS Take 1 tablet (10 mg total) by mouth every 6 (six) hours as needed (for breakthrough pain). 06/06/19   Jacquelin Hawking, NP      VITAL SIGNS:  Blood pressure 105/78, pulse 82, temperature 98.9 F (37.2 C), temperature source Oral, resp. rate (!) 25, height 5' 8" (1.727 m), weight 75.4 kg, SpO2 100 %.  PHYSICAL EXAMINATION:   Physical Exam  GENERAL:  53 y.o.-year-old patient lying in the bed with no acute distress.  EYES: Pupils equal, round, reactive to light and accommodation. No scleral icterus. Extraocular muscles intact.  HEENT: Head atraumatic, normocephalic. Oropharynx and nasopharynx clear.  NECK:  Supple, no jugular venous distention. No thyroid enlargement, no tenderness.  LUNGS: Normal breath sounds bilaterally, no wheezing, rales,rhonchi or crepitation. No use of accessory muscles of respiration.  CARDIOVASCULAR: S1, S2 normal. No murmurs, rubs, or gallops.  ABDOMEN: Soft, tender to palpation, nondistended. Bowel sounds hypoactive. No organomegaly or mass.  EXTREMITIES: No pedal edema, cyanosis, or clubbing. No rash or lesions. + pedal pulses MUSCULOSKELETAL: Normal bulk, and power was 5+ grip and elbow, knee, and ankle flexion and extension bilaterally.  NEUROLOGIC:Alert and oriented x 3. CN 2-12 intact. DTR's (biceps, patellar, and achilles) 2+ and symmetric throughout. Gait not tested due to safety concern. PSYCHIATRIC: The patient is alert and oriented x 3.  SKIN: No obvious rash, lesion, or ulcer.   DATA REVIEWED:  LABORATORY PANEL:   CBC Recent Labs  Lab 06/21/19 0839    WBC 7.7  HGB 12.1  HCT 36.5  PLT 193   ------------------------------------------------------------------------------------------------------------------  Chemistries  Recent Labs  Lab 06/21/19 0839  NA 137  K 3.7  CL 103  CO2 23  GLUCOSE 115*  BUN 18  CREATININE 0.75  CALCIUM 9.5  MG 1.5*  AST 40  ALT 72*  ALKPHOS 145*  BILITOT 0.4   ------------------------------------------------------------------------------------------------------------------  Cardiac Enzymes No results for input(s): TROPONINI in the last 168 hours. ------------------------------------------------------------------------------------------------------------------  RADIOLOGY:  Ct Abdomen Pelvis W Contrast  Result Date: 06/21/2019 CLINICAL DATA:  Onset diarrhea and epigastric pain yesterday. History of metastatic cervical carcinoma. EXAM: CT ABDOMEN AND PELVIS WITH CONTRAST TECHNIQUE: Multidetector CT imaging of the abdomen and pelvis was performed using the standard protocol following bolus administration of intravenous contrast. CONTRAST:  100 mL OMNIPAQUE IOHEXOL 300 MG/ML  SOLN COMPARISON:  CT abdomen and pelvis 06/07/2019 and CT chest, abdomen and pelvis 04/21/2019. FINDINGS: Lower chest: No pleural or pericardial effusion. Multiple bilateral lower lobe pulmonary nodules consistent with metastatic carcinoma are unchanged. Hepatobiliary: No focal liver lesion. The patient is status post cholecystectomy. Mild intra and extrahepatic biliary ductal prominence is unchanged. Pancreas: Unremarkable. No pancreatic ductal dilatation or surrounding inflammatory changes. Spleen: Normal in size without focal abnormality. Adrenals/Urinary Tract: Single punctate nonobstructing stones in the kidneys are unchanged. No hydronephrosis or ureteral stone. Urinary bladder is unremarkable. Adrenal glands appear normal. Stomach/Bowel: The patient is status post right hemicolectomy. The anastomosis is normal in appearance. There is  fecalization of small bowel contents in loops in the pelvis consistent with slow transit, new since the prior exams. A transition point is seen in the right lower quadrant of the abdomen on images 65-68 where more distal small bowel loops are completely decompressed. No pneumatosis, portal venous gas or free intraperitoneal air. The sigmoid colon is incompletely distended with thickening of the walls, unchanged. The stomach is unremarkable. Vascular/Lymphatic: Aortic atherosclerosis. No enlarged abdominal or pelvic lymph nodes. Reproductive: Status post hysterectomy. No adnexal masses. Other: None. Musculoskeletal: No acute or focal abnormality. Convex left scoliosis and multilevel degenerative change noted. IMPRESSION: The examination is positive for small bowel obstruction which appears to be due to adhesions with a transition point in the right lower quadrant. No CT signs of bowel ischemia. No change in multiple pulmonary nodules consistent with metastatic disease. No change in chronic wall thickening of the sigmoid  colon compatible chronic colitis likely secondary to prior radiation therapy. Electronically Signed   By: Inge Rise M.D.   On: 06/21/2019 10:36    EKG:  EKG: normal EKG, normal sinus rhythm, unchanged from previous tracings. Vent. rate 81 BPM PR interval * ms QRS duration 93 ms QT/QTc 372/432 ms P-R-T axes 55 64 54 IMPRESSION AND PLAN:   53 y.o. 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  1. Small bowel obstruction - patient presenting with abdominal pain, nausea/vomiting and diarrhea.  CT abdomen pelvis showing small bowel obstruction.  No evidence of bowel ischemia - Admit to MedSurg  unit - Start aggressive IVFs - Keep NPO - NGT for decompression. Will monitor if bloating improves, starts to pass gas, and for  bowel movement, - Close monitoring for severe pain of signs of infection indicating ischemia - PRN antiemetics - General surgery consult.  Discussed with on-call surgeon Dr. Lysle Pearl  2. 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 - CT abdomen and pelvis as above - repeat stool C difficile PCR - Hold off abx as this time given finding on CT showing known chronic colitis with no signs or symptoms of infection. - Oncology consult to Dr. Janese Banks  3. Hypomagnesemia - Likely secondary to above - Replete and recheck  4. Metastatic cervical cancer - Oncology consult - Continue home pain meds   5. Chronic hepatitis C- liver enzymes are elevated, which is at baseline  6. DVT/PE from factor V leiden mutation - Will hold Xarelto incase she fails medical management for SBO requiring surgical intervention, - Will start Heparin gtt  All the records are reviewed and case discussed with ED provider. Management plans discussed with the patient, family and they are in agreement.  CODE STATUS: FULL  TOTAL TIME TAKING CARE OF THIS PATIENT: 50 minutes.    on 06/21/2019 at 1:07 PM  Rufina Falco, DNP, FNP-BC [facility] Nurse Practitioner Between 7am to 6pm - Pager [phone]  After 6pm go to [email or web address] - password [email or web address]  Office  [phone]  CC: Primary care physician; Arnetha Courser, MD

## 2019-06-21 NOTE — ED Notes (Signed)
Pt's n/v unrelieved by phenergan. Pt given warm blanket.

## 2019-06-21 NOTE — ED Provider Notes (Signed)
[facility] Emergency Department Provider Note  ____________________________________________  Time seen: Approximately 8:49 AM  I have reviewed the triage vital signs and the nursing notes.   HISTORY  Chief Complaint Emesis and Diarrhea    HPI Joanna Hall is a 53 y.o. female with a history of cervical cancer, factor V Leiden, VTE who comes the ED complaining of generalized abdominal pain that started yesterday, gradual onset, waxing and waning, with no aggravating or alleviating factors, currently severe.  Nonradiating.  Associated with vomiting and diarrhea since yesterday.  Not able to tolerate any oral intake or medications this morning.  Tried taking Zofran and Phenergan at home without relief.      Past Medical History:  Diagnosis Date  . Abdominal pain 06/10/2018  . Abnormal cervical Papanicolaou smear 09/18/2017  . Anxiety   . Aortic atherosclerosis ([place])   . Arthritis    neck and knees  . 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 Blood transfusion without reported diagnosis   . Cervical cancer ([place]) 09/2016   mets lung  . Chronic anal fissure   . Chronic diarrhea   . Dyspnea   . Erosive gastropathy 09/18/2017  . Factor V Leiden mutation ([place])   . Fecal incontinence   . Genital warts   . GERD (gastroesophageal reflux disease)   . GI bleed 10/08/2018  . Heart murmur   . Hematochezia   . Hemorrhoids   . 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 Hepatitis, chronic ([place]) 05/05/2017  . History of cancer chemotherapy    completed 06/2017  . History of Clostridium difficile infection    while undergoing chemo.  Negative test 10/2017  . Ileocolic anastomotic leak   . Infarction of kidney ([place]) left kidney   and uterus  . Intestinal infection due to Clostridium difficile 09/18/2017  . Macrocytic anemia with vitamin B12 deficiency   . Multiple gastric ulcers   . Nausea vomiting and diarrhea   .  Pancolitis ([place]) 07/27/2018  . Perianal condylomata   . Pneumonia    History of  . Pulmonary nodules   . Rectal bleeding   . Small bowel obstruction ([place]) 08/2017  . Stiff neck    limited right turn  . Vitamin D deficiency      Patient Active Problem List   Diagnosis Date Noted  . Acute gastroenteritis 06/14/2019  . Intractable nausea and vomiting 06/07/2019  . Chronic diastolic congestive heart failure ([place]) 05/30/2019  . Chest pain in adult 05/19/2019  . Intractable pain 05/19/2019  . Cancer associated pain   . Proctitis, radiation   . Palliative care encounter   . Encounter for monitoring opioid maintenance therapy 11/19/2018  . Adrenal insufficiency ([place]) 09/03/2018  . Chronic diarrhea 06/25/2018  . Chronic anticoagulation 06/25/2018  . Vomiting   . Encounter for antineoplastic chemotherapy 06/04/2018  . 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 Lung metastasis ([place]) 05/15/2018  . Closed fracture of distal end of radius 05/04/2018  . History of total knee arthroplasty 04/24/2018  . Osteoarthritis of left knee 02/28/2018  . Condyloma acuminatum of anus s/p ablation 02/22/2018 02/22/2018  . Condyloma acuminatum of vagina s/p ablation 02/22/2018 02/22/2018  . Positive ANA (antinuclear antibody) 02/04/2018  . Aortic atherosclerosis ([place]) 01/15/2018  . Elevated MCV 01/15/2018  . Anemia 01/15/2018  . Genital warts 01/15/2018  . Vitamin D deficiency 01/15/2018  . Pernicious anemia   .  Impingement syndrome of shoulder region 12/19/2017  . Multiple joint pain 12/19/2017  . Osteoarthritis of right knee 12/19/2017  . B12 deficiency 12/11/2017  . Lung nodules 12/11/2017  . History of Clostridium difficile infection 10/23/2017  . Factor V Leiden ([place]) 10/19/2017  . Goals of care, counseling/discussion 10/19/2017  . Cervical cancer, FIGO stage IB1 ([place]) 09/18/2017  . Chronic hepatitis C without hepatic coma ([place]) 09/18/2017  . Cytopenia 09/18/2017  . Diarrhea 09/18/2017  .  Lower abdominal pain 09/18/2017  . 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 Malignant neoplasm of overlapping sites of cervix ([place]) 09/18/2017  . Renal insufficiency 09/18/2017  . Wound infection after surgery 09/12/2017  . Hypokalemia   . Hypomagnesemia   . Cervical arthritis 07/18/2017  . Dysuria 06/20/2017  . Metastatic cancer ([place]) 05/12/2017  . Essential hypertension 03/15/2017  . Anemia in other chronic diseases classified elsewhere 03/01/2017  . Chemotherapy-induced neutropenia ([place]) 01/29/2017  . 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 CHOLECYSTECTOMY    . COLON SURGERY  08/2017   resection  . COLONOSCOPY WITH PROPOFOL N/A 12/20/2017   Procedure: COLONOSCOPY WITH PROPOFOL;  Surgeon: [name], MD;  Location: [facility] ENDOSCOPY;  Service: Gastroenterology;  Laterality: N/A;  . COLONOSCOPY WITH PROPOFOL N/A 07/30/2018   Procedure: COLONOSCOPY WITH PROPOFOL;  Surgeon: [name], MD;  Location: [facility] ENDOSCOPY;  Service: Gastroenterology;  Laterality: N/A;  . COLONOSCOPY WITH PROPOFOL N/A 10/10/2018   Procedure: COLONOSCOPY WITH PROPOFOL;  Surgeon: Lucilla Lame, MD;  Location: [facility] ENDOSCOPY;  Service: Endoscopy;  Laterality: N/A;  . DIAGNOSTIC LAPAROSCOPY    . ESOPHAGOGASTRODUODENOSCOPY (EGD) WITH PROPOFOL N/A 12/20/2017   Procedure: ESOPHAGOGASTRODUODENOSCOPY (EGD) WITH PROPOFOL;  Surgeon: [name], MD;  Location: [place];  Service: Gastroenterology;  Laterality: N/A;  . ESOPHAGOGASTRODUODENOSCOPY (EGD) WITH PROPOFOL  07/30/2018   Procedure: ESOPHAGOGASTRODUODENOSCOPY (EGD) WITH PROPOFOL;  Surgeon: [name], MD;  Location: [facility] ENDOSCOPY;  Service: Gastroenterology;;  . Otho Darner SIGMOIDOSCOPY N/A 11/21/2018   Procedure: FLEXIBLE SIGMOIDOSCOPY;  Surgeon: [name], MD;  Location: [facility] ENDOSCOPY;  Service: Gastroenterology;  Laterality: N/A;  . LAPAROTOMY N/A 08/31/2017    Procedure: EXPLORATORY LAPAROTOMY for SBO, ileocolectomy, removal of piece of uterine wall;  Surgeon: [name], MD;  Location: [facility] ORS;  Service: General;  Laterality: N/A;  . LASER ABLATION CONDOLAMATA N/A 02/22/2018   Procedure: LASER ABLATION/REMOVAL OF [id number] AROUND ANUS AND VAGINA;  Surgeon: Michael Boston, MD;  Location: [place];  Service: General;  Laterality: N/A;  . OOPHORECTOMY    . PORTA CATH INSERTION N/A 05/13/2018   Procedure: PORTA CATH INSERTION;  Surgeon: Katha Cabal, MD;  Location: [place] CV LAB;  Service: Cardiovascular;  Laterality: N/A;  . SMALL INTESTINE SURGERY    . TANDEM RING INSERTION     x3  . THORACOTOMY    . TOTAL KNEE ARTHROPLASTY Left 04/24/2018   Procedure: TOTAL KNEE ARTHROPLASTY;  Surgeon: Lovell Sheehan, MD;  Location: [facility] ORS;  Service: Orthopedics;  Laterality: Left;     Prior to Admission medications   Medication Sig Start Date End Date Taking? Authorizing Provider  amitriptyline (ELAVIL) 75 MG tablet Take 1 tablet (75 mg total) by mouth at bedtime. 05/06/19 08/04/19  [name], MD  Calcium Carb-Cholecalciferol (CALCIUM 500 +D) 500-400 MG-UNIT TABS Take 2 tablets by mouth daily.    [provider]  diphenoxylate-atropine (LOMOTIL) 2.5-0.025 MG tablet TAKE (2) TABLETS FOUR TIMES DAILY.  04/21/19   [name], MD  famotidine (PEPCID) 20 MG tablet Take 1 tablet (20 mg total) by mouth 2 (two) times daily for 5 days. 05/23/19 05/28/19  Duffy Bruce, MD  feeding supplement, ENSURE ENLIVE, (ENSURE ENLIVE) LIQD Take 237 mLs by mouth 3 (three) times daily between meals. 05/22/19   Epifanio Lesches, MD  hydrocortisone (CORTEF) 20 MG tablet Take 1 tablet (20 mg total) by mouth daily. 06/10/19   Gladstone Lighter, MD  hydrOXYzine (ATARAX/VISTARIL) 10 MG tablet Take 1 tablet (10 mg total) by mouth 3 (three) times daily as needed. 06/13/19   Cammie Sickle, MD  hyoscyamine (LEVBID) 0.375 MG 12  hr tablet Take 1 tablet (0.375 mg total)by mouth 2 (two) times daily. 01/03/19   [name], MD  Multiple Vitamins-Minerals (MULTIVITAMIN WITH MINERALS) tablet Take 1 tablet by mouth daily. 08/01/18 08/01/19  Joanna Grayer, MD  ondansetron (ZOFRAN ODT) 4 MG disintegrating tablet Take 1 tablet (4 mg total) by mouth every 8 (eight) hours as needed for nausea or vomiting. 03/20/19   Verlon Au, NP  oxyCODONE (OXYCONTIN) 15 mg 12 hr tablet Take 1 tablet (15 mg total) by mouth every 12 (twelve) hours. 05/29/19   Cammie Sickle, MD  Oxycodone HCl 10 MG TABS Take 1 tablet (10 mg total) by mouth every 6 (six) hours as needed (for breakthrough pain). 06/06/19   Jacquelin Hawking, NP  pantoprazole (PROTONIX) 40 MG tablet Take 1 tablet (40 mg total) by mouth daily. 03/18/19   Cammie Sickle, MD  potassium chloride SA (K-DUR) 20 MEQ tablet Take 1 tablet (20 mEq total) by mouth daily as needed (if having nausea/vomiting- to supplements for lost potassium). 06/10/19   Gladstone Lighter, MD  promethazine (PHENERGAN) 25 MG tablet Take 1 tablet (25 mg total) by mouth every 8 (eight) hours as needed for nausea or vomiting. 03/05/19   Earlie Server, MD  rivaroxaban (XARELTO) 20 MG TABS tablet Take 1 tablet (20 mg total) by mouth daily with supper. Patient taking differently: Take 20 mg by mouth daily with supper. Takes in the morning 05/05/19   Cammie Sickle, MD  VENTOLIN HFA 108 (90 Base) MCG/ACT inhaler Inhale 1-2 puffs into the lungs every 4 (four) hours as needed for shortness of breath. 02/19/19   Verlon Au, NP     Allergies Ketamine   Family History  Problem Relation Age of Onset  . Hypertension Father   . Diabetes Father   . Alcohol abuse Daughter   . Hypertension Maternal Grandmother   . Diabetes Maternal Grandmother   . Diabetes Paternal Grandmother   . Hypertension Paternal Grandmother     Social History Social History   Tobacco Use  . Smoking status: Former  Smoker    Quit date: 10/16/2006    Years since quitting: 12.6  . Smokeless tobacco: Never Used  Substance Use Topics  . Alcohol use: Not Currently    Frequency: Never    Comment: seldom  . Drug use: Yes    Types: Marijuana    Comment: not very often    Review of Systems  Constitutional:   No fever positive chills.  ENT:   No sore throat. No rhinorrhea. Cardiovascular:   No chest pain or syncope. Respiratory:   No dyspnea or cough. Gastrointestinal:   Positive as above for abdominal pain, vomiting and diarrhea.  Musculoskeletal:   Negative for focal pain or swelling All other systems reviewed and are negative except as documented above  in ROS and HPI.  ____________________________________________   PHYSICAL EXAM:  VITAL SIGNS: ED Triage Vitals  Enc Vitals Group     BP 06/21/19 0824 (!) 172/110     Pulse Rate 06/21/19 0824 94     Resp 06/21/19 0824 (!) 22     Temp 06/21/19 0824 98.9 F (37.2 C)     Temp Source 06/21/19 0824 Oral     SpO2 06/21/19 0824 100 %     Weight 06/21/19 0819 166 lb 3.2 oz (75.4 kg)     Height 06/21/19 0819 5' 8"  (1.727 m)     Head Circumference --      Peak Flow --      Pain Score 06/21/19 0819 10     Pain Loc --      Pain Edu? --      Excl. in [place]? --     Vital signs reviewed, nursing assessments reviewed.   Constitutional:   Alert and oriented.  Ill-appearing Eyes:   Conjunctivae are normal. EOMI. PERRL. ENT      Head:   Normocephalic and atraumatic.      Nose:   No congestion/rhinnorhea.       Mouth/Throat:   MMM, no pharyngeal erythema. No peritonsillar mass.       Neck:   No meningismus. Full ROM. Hematological/Lymphatic/Immunilogical:   No cervical lymphadenopathy. Cardiovascular:   RRR. Symmetric bilateral radial and DP pulses.  No murmurs. Cap refill less than 2 seconds. Respiratory:   Normal respiratory effort without tachypnea/retractions. Breath sounds are clear and equal bilaterally. No  wheezes/rales/rhonchi. Gastrointestinal:   Soft with generalized abdominal tenderness.  Non distended. There is no CVA tenderness.  No rebound, rigidity, or guarding. Musculoskeletal:   Normal range of motion in all extremities. No joint effusions.  No lower extremity tenderness.  No edema. Neurologic:   Normal speech and language.  Motor grossly intact. No acute focal neurologic deficits are appreciated.  Skin:    Skin is warm, dry and intact. No rash noted.  No petechiae, purpura, or bullae.  ____________________________________________    LABS (pertinent positives/negatives) (all labs ordered are listed, but only abnormal results are displayed) Labs Reviewed  COMPREHENSIVE METABOLIC PANEL - Abnormal; Notable for the following components:      Result Value   Glucose, Bld 115 (*)    ALT 72 (*)    Alkaline Phosphatase 145 (*)    All other components within normal limits  CBC WITH DIFFERENTIAL/PLATELET - Abnormal; Notable for the following components:   RBC 3.67 (*)    All other components within normal limits  MAGNESIUM - Abnormal; Notable for the following components:   Magnesium 1.5 (*)    All other components within normal limits  SARS CORONAVIRUS 2 (HOSPITAL ORDER, [place] [facility])  ETHANOL  LIPASE, BLOOD  LACTIC ACID, PLASMA  PHOSPHORUS  URINALYSIS, COMPLETE (UACMP) WITH MICROSCOPIC   ____________________________________________   EKG  Interpreted by me Sinus rhythm rate of 81, normal axis intervals QRS ST segments and T waves  ____________________________________________    RADIOLOGY  Ct Abdomen Pelvis W Contrast  Result Date: 06/21/2019 CLINICAL DATA:  Onset diarrhea and epigastric pain yesterday. History of metastatic cervical carcinoma. EXAM: CT ABDOMEN AND PELVIS WITH CONTRAST TECHNIQUE: Multidetector CT imaging of the abdomen and pelvis was performed using the standard protocol following bolus administration of intravenous contrast.  CONTRAST:  100 mL OMNIPAQUE IOHEXOL 300 MG/ML  SOLN COMPARISON:  CT abdomen and pelvis 06/07/2019 and CT chest, abdomen  and pelvis 04/21/2019. FINDINGS: Lower chest: No pleural or pericardial effusion. Multiple bilateral lower lobe pulmonary nodules consistent with metastatic carcinoma are unchanged. Hepatobiliary: No focal liver lesion. The patient is status post cholecystectomy. Mild intra and extrahepatic biliary ductal prominence is unchanged. Pancreas: Unremarkable. No pancreatic ductal dilatation or surrounding inflammatory changes. Spleen: Normal in size without focal abnormality. Adrenals/Urinary Tract: Single punctate nonobstructing stones in the kidneys are unchanged. No hydronephrosis or ureteral stone. Urinary bladder is unremarkable. Adrenal glands appear normal. Stomach/Bowel: The patient is status post right hemicolectomy. The anastomosis is normal in appearance. There is fecalization of small bowel contents in loops in the pelvis consistent with slow transit, new since the prior exams. A transition point is seen in the right lower quadrant of the abdomen on images 65-68 where more distal small bowel loops are completely decompressed. No pneumatosis, portal venous gas or free intraperitoneal air. The sigmoid colon is incompletely distended with thickening of the walls, unchanged. The stomach is unremarkable. Vascular/Lymphatic: Aortic atherosclerosis. No enlarged abdominal or pelvic lymph nodes. Reproductive: Status post hysterectomy. No adnexal masses. Other: None. Musculoskeletal: No acute or focal abnormality. Convex left scoliosis and multilevel degenerative change noted. IMPRESSION: The examination is positive for small bowel obstruction which appears to be due to adhesions with a transition point in the right lower quadrant. No CT signs of bowel ischemia. No change in multiple pulmonary nodules consistent with metastatic disease. No change in chronic wall thickening of the sigmoid colon  compatible chronic colitis likely secondary to prior radiation therapy. Electronically Signed   By: Inge Rise M.D.   On: 06/21/2019 10:36    ____________________________________________   PROCEDURES Procedures  ____________________________________________  DIFFERENTIAL DIAGNOSIS   Bowel obstruction, intussusception, bowel perforation, intra-abdominal abscess, diverticulitis, tumor necrosis  CLINICAL IMPRESSION / ASSESSMENT AND PLAN / ED COURSE  Medications ordered in the ED: Medications  lidocaine (XYLOCAINE) 2 % viscous mouth solution 15 mL (has no administration in time range)  sodium chloride 0.9 % bolus 1,000 mL (0 mLs Intravenous Stopped 06/21/19 1201)  HYDROmorphone (DILAUDID) injection 1 mg (1 mg Intravenous Given 06/21/19 0849)  promethazine (PHENERGAN) injection 25 mg (25 mg Intravenous Given 06/21/19 0900)  iohexol (OMNIPAQUE) 300 MG/ML solution 100 mL (100 mLs Intravenous Contrast Given 06/21/19 0957)  HYDROmorphone (DILAUDID) injection 1 mg (1 mg Intravenous Given 06/21/19 0922)  HYDROmorphone (DILAUDID) injection 2 mg (2 mg Intravenous Given 06/21/19 1215)    Pertinent labs & imaging results that were available during my care of the patient were reviewed by me and considered in my medical decision making (see chart for details).  Joanna Hall was evaluated in Emergency Department on 06/21/2019 for the symptoms described in the history of present illness. She was evaluated in the context of the global COVID-19 pandemic, which necessitated consideration that the patient might be at risk for infection with the SARS-CoV-2 virus that causes COVID-19. Institutional protocols and algorithms that pertain to the evaluation of patients at risk for COVID-19 are in a state of rapid change based on information released by regulatory bodies including the CDC and federal and state organizations. These policies and algorithms were followed during the patient's care in the ED.      Clinical Course as of Jun 21 1215  Sat Jun 21, 2019  0818 Pt p/w severe generalized abdomina pain and tenderness.  With medical history and severity of pain, will need labs and CT scan of the abdomen and pelvis.  Dilaudid 1 mg IV for initial pain control.  Recently had a negative C. difficile test on August 29.   [PS]    Clinical Course User Index [PS] Carrie Mew, MD    ----------------------------------------- 12:16 PM on 06/21/2019 -----------------------------------------   CT reveals small bowel obstruction.  Discussed with surgery who will consult.  Discussed with hospitalist for further management.  Will plan to place an NG tube.  Giving a repeat dose of 2 mg IV Dilaudid for intractable pain.  ____________________________________________   FINAL CLINICAL IMPRESSION(S) / ED DIAGNOSES    Final diagnoses:  Generalized abdominal pain  SBO (small bowel obstruction) ([place])  Metastatic cancer [facility])     ED Discharge Orders    None      Portions of this note were generated with dragon dictation software. Dictation errors may occur despite best attempts at proofreading.   Carrie Mew, MD 06/21/19 1216

## 2019-06-21 NOTE — ED Triage Notes (Signed)
Pt bought in via EMS for vomiting/ diarrhea and epigastric pain that started yesterday. Pt states that she had immunotherapy on Thursday.  Pt has metastatic carcinoma. Pt took zofran/ phenergan at home with no relief.

## 2019-06-21 NOTE — Consult Note (Addendum)
Subjective:   CC: SBO  HPI:  Joanna Hall is a 53 y.o. female who was consulted by [facility] for issue above.  Symptoms were first noted couple days ago  ago. Pain is sharp, worst in RLQ, but has tenderness in all four quadrants.  Associated with nausea and diarrhea, exacerbated by nothing specific.  Hx of mestastatic cervical cancer, and bowel obstruction in past, requiring resection with Dr. Hampton Abbot.  States this is similar pain, although last diarrhea episode was last night.  Prior to that, she had some slowed output.   Past Medical History:  has a past medical history of Abdominal pain (06/10/2018), Abnormal cervical Papanicolaou smear (09/18/2017), Anxiety, Aortic atherosclerosis ([place]), Arthritis, Blood clots in brain, Blood transfusion without reported diagnosis, Cervical cancer ([place]) (09/2016), Chronic anal fissure, Chronic diarrhea, Dyspnea, Erosive gastropathy (09/18/2017), Factor V Leiden mutation ([name]), Fecal incontinence, Genital warts, GERD (gastroesophageal reflux disease), GI bleed (10/08/2018), Heart murmur, Hematochezia, Hemorrhoids, Hepatitis C, Hepatitis, chronic ([place]) (05/05/2017), History of cancer chemotherapy, History of Clostridium difficile infection, Ileocolic anastomotic leak, 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  Past Surgical History:  Past Surgical History:  Procedure Laterality Date  . CHOLECYSTECTOMY    . COLON SURGERY  08/2017   resection  . COLONOSCOPY WITH PROPOFOL N/A 12/20/2017   Procedure: COLONOSCOPY WITH PROPOFOL;  Surgeon: [name], MD;  Location: [facility] ENDOSCOPY;  Service: Gastroenterology;  Laterality:  N/A;  . COLONOSCOPY WITH PROPOFOL N/A 07/30/2018   Procedure: COLONOSCOPY WITH PROPOFOL;  Surgeon: [name], MD;  Location: [facility] ENDOSCOPY;  Service: Gastroenterology;  Laterality: N/A;  . COLONOSCOPY WITH PROPOFOL N/A 10/10/2018   Procedure: COLONOSCOPY WITH PROPOFOL;  Surgeon: Lucilla Lame, MD;  Location: [facility] ENDOSCOPY;  Service: Endoscopy;  Laterality: N/A;  . DIAGNOSTIC LAPAROSCOPY    . ESOPHAGOGASTRODUODENOSCOPY (EGD) WITH PROPOFOL N/A 12/20/2017   Procedure: ESOPHAGOGASTRODUODENOSCOPY (EGD) WITH PROPOFOL;  Surgeon: [name], MD;  Location: [place];  Service: Gastroenterology;  Laterality: N/A;  . ESOPHAGOGASTRODUODENOSCOPY (EGD) WITH PROPOFOL  07/30/2018   Procedure: ESOPHAGOGASTRODUODENOSCOPY (EGD) WITH PROPOFOL;  Surgeon: [name], MD;  Location: [facility] ENDOSCOPY;  Service: Gastroenterology;;  . Otho Darner SIGMOIDOSCOPY N/A 11/21/2018   Procedure: FLEXIBLE SIGMOIDOSCOPY;  Surgeon: [name], MD;  Location: [facility] ENDOSCOPY;  Service: Gastroenterology;  Laterality: N/A;  . LAPAROTOMY N/A 08/31/2017   Procedure: EXPLORATORY LAPAROTOMY for SBO, ileocolectomy, removal of piece of uterine wall;  Surgeon: [name], MD;  Location: [facility] ORS;  Service: General;  Laterality: N/A;  . LASER ABLATION CONDOLAMATA N/A 02/22/2018   Procedure: LASER ABLATION/REMOVAL OF ESPQZRAQTMA AROUND ANUS AND VAGINA;  Surgeon: Michael Boston, MD;  Location: [place];  Service: General;  Laterality: N/A;  . OOPHORECTOMY    . PORTA CATH INSERTION N/A 05/13/2018   Procedure: PORTA CATH INSERTION;  Surgeon: Katha Cabal, MD;  Location: [place] CV LAB;  Service: Cardiovascular;  Laterality: N/A;  . SMALL INTESTINE SURGERY    . TANDEM RING INSERTION     x3  . THORACOTOMY    . TOTAL KNEE ARTHROPLASTY Left 04/24/2018   Procedure: TOTAL KNEE ARTHROPLASTY;  Surgeon: Lovell Sheehan, MD;  Location: [facility] ORS;  Service: Orthopedics;  Laterality: Left;     Family History: family history includes Alcohol abuse in  her daughter; Diabetes in her father, maternal grandmother, and paternal grandmother; Hypertension in her father, maternal grandmother, and paternal grandmother.  Social History:  reports that she quit smoking about 12 years ago. She has never used smokeless tobacco. She reports previous alcohol use. She reports current drug use. Drug: Marijuana.  Current Medications:  amitriptyline (ELAVIL) 75 MG tablet Take 1 tablet (75 mg total) by mouth at bedtime. Lang Snow, NP Not Ordered  Calcium Carb-Cholecalciferol (CALCIUM 500 +D) 500-400 MG-UNIT TABS Take 2 tablets by mouth daily. Lang Snow, NP Not Ordered  diphenoxylate-atropine (LOMOTIL) 2.5-0.025 MG tablet TAKE (2) TABLETS FOUR TIMES DAILY. Lang Snow, NP Not Ordered  famotidine (PEPCID) 20 MG tablet Take 1 tablet (20 mg total) by mouth 2 (two) times daily for 5 days. Lang Snow, NP Not Ordered  Ordered as: famotidine (PEPCID) tablet 20 mg - 20 mg, Oral, 2 times daily, First dose on Sat 06/21/19 at 1315 (Discontinued)  feeding supplement, ENSURE ENLIVE, (ENSURE ENLIVE) LIQD Take 237 mLs by mouth 3 (three) times daily between meals. Lang Snow, NP Reordered  Ordered as: feeding supplement (ENSURE ENLIVE) (ENSURE ENLIVE) liquid 237 mL - 237 mL, Oral, 3 times daily between meals, First dose on Sat 06/21/19 at 1400  hydrocortisone (CORTEF) 20 MG tablet Take 1 tablet (20 mg total) by mouth daily. Lang Snow, NP Reordered  Ordered as: hydrocortisone (CORTEF) tablet 20 mg - 20 mg, Oral, Daily, First dose on Sat 06/21/19 at 1315  hydrOXYzine (ATARAX/VISTARIL) 10 MG tablet Take 1 tablet (10 mg total) by mouth 3 (three) times daily as needed. Lang Snow, NP Not Ordered  hyoscyamine (LEVBID) 0.375 MG 12 hr tablet Take 1 tablet (0.375 mg total)by mouth 2 (two) times daily. Lang Snow, NP Reordered   Ordered as: hyoscyamine (LEVBID) 0.375 MG 12 hr tablet 0.375 mg - 0.375 mg, Oral, Every 12 hours, First dose on Sat 06/21/19 at 1315  Multiple Vitamins-Minerals (MULTIVITAMIN WITH MINERALS) tablet Take 1 tablet by mouth daily. Lang Snow, NP Not Ordered  ondansetron (ZOFRAN ODT) 4 MG disintegrating tablet Take 1 tablet (4 mg total) by mouth every 8 (eight) hours as needed for nausea or vomiting. Lang Snow, NP Not Ordered  oxyCODONE (OXYCONTIN) 15 mg 12 hr tablet Take 1 tablet (15 mg total) by mouth every 12 (twelve) hours. Lang Snow, NP Not Ordered  pantoprazole (PROTONIX) 40 MG tablet Take 1 tablet (40 mg total) by mouth daily. Lang Snow, NP Not Ordered  potassium chloride SA (K-DUR) 20 MEQ tablet Take 1 tablet (20 mEq total) by mouth daily as needed (if having nausea/vomiting- to supplements for lost potassium). Lang Snow, NP Not Ordered  promethazine (PHENERGAN) 25 MG tablet Take 1 tablet (25 mg total) by mouth every 8 (eight) hours as needed for nausea or vomiting. Lang Snow, NP Not Ordered  rivaroxaban (XARELTO) 20 MG TABS tablet Take 1 tablet (20 mg total) by mouth daily with supper. Lang Snow, NP Reordered   Patient taking differently: Take 20 mg by mouth daily with supper. Takes in the morning    Ordered as: rivaroxaban (XARELTO) tablet 20 mg - Use ACTUAL BODY WEIGHT to determine CrCl for rivaroxaban dosing.  Consider alternative anticoagulant if CrCl < 30 mL/min. For Atrial Fib indication, reduce dose for CrCl 15 - 50 mL/min.  May administer via G-Tubes. Do NOT give via J-Tube due to significant decrease in absorption.  20 mg, Oral, Daily with supper,  First dose on Sat 06/21/19 at 1700  VENTOLIN HFA 108 (90 Base) MCG/ACT inhaler Inhale 1-2 puffs into the lungs every 4 (four) hours as needed for shortness of breath. Lang Snow, NP Reordered  Ordered as: albuterol (PROVENTIL) (2.5  MG/3ML) 0.083% nebulizer solution 3 mL - 3 mL, Inhalation, Every 4 hours PRN, shortness of breath, Starting Sat 06/21/19 at 1256  Oxycodone HCl 10 MG TABS Take 1 tablet (10 mg total) by mouth every 6 (six) hours as needed (for breakthrough pain).       Allergies:  Allergies as of 06/21/2019 - Review Complete 06/21/2019  Allergen Reaction Noted  . Ketamine Anxiety and Other (See Comments) 02/22/2017    ROS:  General: Denies weight loss, weight gain, fatigue, fevers, chills, and night sweats. Eyes: Denies blurry vision, double vision, eye pain, itchy eyes, and tearing. Ears: Denies hearing loss, earache, and ringing in ears. Nose: Denies sinus pain, congestion, infections, runny nose, and nosebleeds. Mouth/throat: Denies hoarseness, sore throat, bleeding gums, and difficulty swallowing. Heart: Denies chest pain, palpitations, racing heart, irregular heartbeat, leg pain or swelling, and decreased activity tolerance. Respiratory: Denies breathing difficulty, shortness of breath, wheezing, cough, and sputum. GI: Denies change in appetite, GU: Denies difficulty urinating, pain with urinating, urgency, frequency, blood in urine. Musculoskeletal: Denies swelling, muscle weakness. Skin: Denies rash, itching, mass, tumors, sores, and boils Neurologic: Denies headache, fainting, dizziness, seizures, numbness, and tingling. Psychiatric: Deniesdifficulty sleeping, and memory loss. Endocrine: Denies heat or cold intolerance, and increased thirst or urination. Blood/lymph: Denies  swollen glands     Objective:     BP (!) 80/66   Pulse 85   Temp 98.9 F (37.2 C) (Oral)   Resp 18   Ht 5' 8"  (1.727 m)   Wt 75.4 kg   SpO2 99%   BMI 25.27 kg/m   Constitutional :  alert, cooperative, appears stated age and mild distress  Lymphatics/Throat:  no asymmetry, masses, or scars  Respiratory:  clear to auscultation bilaterally  Cardiovascular:  regular rate and rhythm  Gastrointestinal: soft, no  guarding, TTP all four quadrants, worst in RLQ.   Musculoskeletal: Steady movement  Skin: Cool and moist.  Psychiatric: Normal affect, non-agitated, not confused       LABS:  CMP Latest Ref Rng & Units 06/21/2019 06/19/2019 06/15/2019  Glucose 70 - 99 mg/dL 115(H) 99 77  BUN 6 - 20 mg/dL 18 13 8   Creatinine 0.44 - 1.00 mg/dL 0.75 0.68 0.72  Sodium 135 - 145 mmol/L 137 134(L) 139  Potassium 3.5 - 5.1 mmol/L 3.7 3.8 4.3  Chloride 98 - 111 mmol/L 103 101 105  CO2 22 - 32 mmol/L 23 26 27   Calcium 8.9 - 10.3 mg/dL 9.5 8.9 8.9  Total Protein 6.5 - 8.1 g/dL 7.5 7.1 -  Total Bilirubin 0.3 - 1.2 mg/dL 0.4 0.5 -  Alkaline Phos 38 - 126 U/L 145(H) 126 -  AST 15 - 41 U/L 40 50(H) -  ALT 0 - 44 U/L 72(H) 69(H) -   CBC Latest Ref Rng & Units 06/21/2019 06/19/2019 06/14/2019  WBC 4.0 - 10.5 K/uL 7.7 5.3 4.7  Hemoglobin 12.0 - 15.0 g/dL 12.1 11.5(L) 9.6(L)  Hematocrit 36.0 - 46.0 % 36.5 35.4(L) 29.3(L)  Platelets 150 - 400 K/uL 193 165 134(L)    RADS: CLINICAL DATA:  Onset diarrhea and epigastric pain yesterday. History of metastatic cervical carcinoma.  EXAM: CT ABDOMEN AND PELVIS WITH CONTRAST  TECHNIQUE: Multidetector CT imaging of the abdomen and pelvis was  performed using the standard protocol following bolus administration of intravenous contrast.  CONTRAST:  100 mL OMNIPAQUE IOHEXOL 300 MG/ML  SOLN  COMPARISON:  CT abdomen and pelvis 06/07/2019 and CT chest, abdomen and pelvis 04/21/2019.  FINDINGS: Lower chest: No pleural or pericardial effusion. Multiple bilateral lower lobe pulmonary nodules consistent with metastatic carcinoma are unchanged.  Hepatobiliary: No focal liver lesion. The patient is status post cholecystectomy. Mild intra and extrahepatic biliary ductal prominence is unchanged.  Pancreas: Unremarkable. No pancreatic ductal dilatation or surrounding inflammatory changes.  Spleen: Normal in size without focal abnormality.  Adrenals/Urinary Tract: Single  punctate nonobstructing stones in the kidneys are unchanged. No hydronephrosis or ureteral stone. Urinary bladder is unremarkable. Adrenal glands appear normal.  Stomach/Bowel: The patient is status post right hemicolectomy. The anastomosis is normal in appearance.  There is fecalization of small bowel contents in loops in the pelvis consistent with slow transit, new since the prior exams. A transition point is seen in the right lower quadrant of the abdomen on images 65-68 where more distal small bowel loops are completely decompressed. No pneumatosis, portal venous gas or free intraperitoneal air.  The sigmoid colon is incompletely distended with thickening of the walls, unchanged. The stomach is unremarkable.  Vascular/Lymphatic: Aortic atherosclerosis. No enlarged abdominal or pelvic lymph nodes.  Reproductive: Status post hysterectomy. No adnexal masses.  Other: None.  Musculoskeletal: No acute or focal abnormality. Convex left scoliosis and multilevel degenerative change noted.  IMPRESSION: The examination is positive for small bowel obstruction which appears to be due to adhesions with a transition point in the right lower quadrant. No CT signs of bowel ischemia.  No change in multiple pulmonary nodules consistent with metastatic disease.  No change in chronic wall thickening of the sigmoid colon compatible chronic colitis likely secondary to prior radiation therapy.   Electronically Signed   By: Inge Rise M.D.   On: 06/21/2019 10:36 Assessment:   Recurrent SBO Stage IV cervical cancer Factor V leiden Hep C   Plan:   Conservative management with NG decompression, npo.  She is high risk candidate if surgery is needed.  She wanted to discuss ostomy creation to deal with her chronic diarrhea, but my initial impression is an ostomy may not make it any easier to deal with her diarrhea.  I will ask Dr. Hampton Abbot for a second opinion regarding  this, if she resolves from this SBO.  Factor V leiden- Will also like to hold xarelto if possible if preparation for failed medical management forcing [place] to proceed with surgery.  Stage IV cervical cancer, Hep C- continue management per primary team

## 2019-06-21 NOTE — ED Notes (Signed)
Report given to receiving nurse

## 2019-06-22 ENCOUNTER — Inpatient Hospital Stay: Payer: Medicaid Other

## 2019-06-22 DIAGNOSIS — E440 Moderate protein-calorie malnutrition: Secondary | ICD-10-CM | POA: Insufficient documentation

## 2019-06-22 DIAGNOSIS — C7802 Secondary malignant neoplasm of left lung: Secondary | ICD-10-CM

## 2019-06-22 DIAGNOSIS — Z79899 Other long term (current) drug therapy: Secondary | ICD-10-CM

## 2019-06-22 DIAGNOSIS — Z923 Personal history of irradiation: Secondary | ICD-10-CM

## 2019-06-22 DIAGNOSIS — R112 Nausea with vomiting, unspecified: Secondary | ICD-10-CM

## 2019-06-22 DIAGNOSIS — K56609 Unspecified intestinal obstruction, unspecified as to partial versus complete obstruction: Secondary | ICD-10-CM

## 2019-06-22 DIAGNOSIS — Z9114 Patient's other noncompliance with medication regimen: Secondary | ICD-10-CM

## 2019-06-22 DIAGNOSIS — Z86718 Personal history of other venous thrombosis and embolism: Secondary | ICD-10-CM

## 2019-06-22 DIAGNOSIS — G629 Polyneuropathy, unspecified: Secondary | ICD-10-CM

## 2019-06-22 DIAGNOSIS — C7801 Secondary malignant neoplasm of right lung: Secondary | ICD-10-CM

## 2019-06-22 DIAGNOSIS — Z9221 Personal history of antineoplastic chemotherapy: Secondary | ICD-10-CM

## 2019-06-22 DIAGNOSIS — C539 Malignant neoplasm of cervix uteri, unspecified: Secondary | ICD-10-CM

## 2019-06-22 DIAGNOSIS — Z86711 Personal history of pulmonary embolism: Secondary | ICD-10-CM

## 2019-06-22 DIAGNOSIS — Z7901 Long term (current) use of anticoagulants: Secondary | ICD-10-CM

## 2019-06-22 DIAGNOSIS — Z90722 Acquired absence of ovaries, bilateral: Secondary | ICD-10-CM

## 2019-06-22 LAB — CBC
HCT: 31.8 % — ABNORMAL LOW (ref 36.0–46.0)
Hemoglobin: 10.3 g/dL — ABNORMAL LOW (ref 12.0–15.0)
MCH: 32.9 pg (ref 26.0–34.0)
MCHC: 32.4 g/dL (ref 30.0–36.0)
MCV: 101.6 fL — ABNORMAL HIGH (ref 80.0–100.0)
Platelets: 124 10*3/uL — ABNORMAL LOW (ref 150–400)
RBC: 3.13 MIL/uL — ABNORMAL LOW (ref 3.87–5.11)
RDW: 14 % (ref 11.5–15.5)
WBC: 3.5 10*3/uL — ABNORMAL LOW (ref 4.0–10.5)
nRBC: 0 % (ref 0.0–0.2)

## 2019-06-22 LAB — APTT
aPTT: 96 seconds — ABNORMAL HIGH (ref 24–36)
aPTT: 98 seconds — ABNORMAL HIGH (ref 24–36)

## 2019-06-22 LAB — HEPARIN LEVEL (UNFRACTIONATED): Heparin Unfractionated: 0.78 IU/mL — ABNORMAL HIGH (ref 0.30–0.70)

## 2019-06-22 MED ORDER — DIATRIZOATE MEGLUMINE & SODIUM 66-10 % PO SOLN: 90.0000 mL | Freq: Once | ORAL | Status: DC

## 2019-06-22 MED ORDER — HYDROMORPHONE HCL 1 MG/ML IJ SOLN
1.0000 mg | INTRAMUSCULAR | Status: DC | PRN
  Administered 2019-06-22 – 2019-06-25 (×13): 1 mg via INTRAVENOUS
  Filled 2019-06-22 (×13): qty 1

## 2019-06-22 MED ORDER — MAGNESIUM SULFATE 2 GM/50ML IV SOLN
2.0000 g | Freq: Once | INTRAVENOUS | Status: AC
  Administered 2019-06-22: 2 g via INTRAVENOUS
  Filled 2019-06-22: qty 50

## 2019-06-22 MED ORDER — DIATRIZOATE MEGLUMINE & SODIUM 66-10 % PO SOLN
90.0000 mL | Freq: Once | ORAL | Status: AC
  Administered 2019-06-22: 90 mL via NASOGASTRIC

## 2019-06-22 MED ORDER — POTASSIUM CHLORIDE IN NACL 20-0.9 MEQ/L-% IV SOLN
INTRAVENOUS | Status: DC
  Administered 2019-06-22 – 2019-06-24 (×3): via INTRAVENOUS
  Filled 2019-06-22 (×6): qty 1000

## 2019-06-22 MED ORDER — ONDANSETRON HCL 4 MG/2ML IJ SOLN
4.0000 mg | Freq: Four times a day (QID) | INTRAMUSCULAR | Status: DC | PRN
  Administered 2019-06-22 – 2019-06-25 (×11): 4 mg via INTRAVENOUS
  Filled 2019-06-22 (×11): qty 2

## 2019-06-22 MED ORDER — ONDANSETRON HCL 4 MG/2ML IJ SOLN
INTRAMUSCULAR | Status: AC
  Filled 2019-06-22: qty 2

## 2019-06-22 NOTE — Progress Notes (Signed)
Patient ID: Joanna Hall, female   DOB: 1966/02/20, 53 y.o.   MRN: [id number]  ACP note  Patient coming back into the hospital with nausea vomiting abdominal pain and diarrhea.  Found to have bowel obstruction on CT scan.  She states that she has had numerous episodes of bowel obstruction in the past. She is still not feeling well despite NG tube placement. Surgery wants to do conservative management.  CODE STATUS discussed.  Patient wants to remain a full code at this point in time.  She states that if she was brain dead or long-term on the ventilator she would not want to live like that.  Plan.  Conservative management for bowel obstruction with n.p.o. NG tube IV fluids as needed pain medications and nausea medications.  Continue to monitor day by day.  Time spent on ACP discussion 17 minutes Dr Loletha Grayer

## 2019-06-22 NOTE — Progress Notes (Signed)
   06/22/19 1100  Clinical Encounter Type  Visited With Patient  Visit Type Follow-up;Psychological support;Spiritual support  Referral From Nurse  Spiritual Encounters  Spiritual Needs Literature;Prayer  Stress Factors  Patient Stress Factors Exhausted;Health changes;Loss of control  Ch responded to OR requesting a prayer. Pt was sitting in a bed and said she wasn't in a good place last night. Pt shared how tired she is with her medical conditions and yet how she still wants to live and will live. Pt also shared about the people in her life who care for her and how she also wants to be of help to others. Pt was concerned about her father as he is in emotional distress. Ch read serenity prayer with the pt and prayed for her father and her health.

## 2019-06-22 NOTE — Consult Note (Signed)
Hematology/Oncology Consult note [facility] [phone] [phone]  Patient Care Team: Arnetha Courser, MD as PCP - General (Family Medicine) Mellody Drown, MD as Consulting Physician (Obstetrics and Gynecology) [name], MD as Consulting Physician (Gastroenterology) Michael Boston, MD as Consulting Physician (General Surgery) Lovell Sheehan, MD as Consulting Physician (Orthopedic Surgery) Cammie Sickle, MD as Consulting Physician (Oncology) Earnestine Leys, MD as Consulting Physician (Orthopedic Surgery)   Name of the patient: Joanna Hall  [id number]  July 19, 1966    Reason for referral- smallbowel obstruction   Referring physician- Dr. Leslye Peer  Date of visit: 06/22/2019    History of presenting illness-  Patient is a 53 year old female who sees Dr. Rogue Bussing as an outpatient for metastatic stomach adenocarcinoma. She is currently on third line single agent Keytruda and received last treatment on 06/19/2019.  She presented to the ER with symptoms of nausea and vomiting and underwent CT abdomen which showed small bowel obstruction probably secondary to obesity with consent point in the right lower quadrant.  He also noted to have chronic distention and thickening of the sigmoid colon which was unchanged.  She has been evaluated by general surgery and present plan is conservative management with NG decompression to see small bowel obstruction resolves.   She currently reports diffuse abdominal pain. Still has a high NG tube output. Had 1 episode of diarrhea this morning    Pain scale- 6  Review of systems- Review of Systems  Constitutional: Negative for chills, fever, malaise/fatigue and weight loss.  HENT: Negative for congestion, ear discharge and nosebleeds.   Eyes: Negative for blurred vision.  Respiratory: Negative for cough, hemoptysis, sputum production, shortness of breath and wheezing.   Cardiovascular:  Negative for chest pain, palpitations, orthopnea and claudication.  Gastrointestinal: Positive for abdominal pain and nausea. Negative for blood in stool, constipation, diarrhea, heartburn, melena and vomiting.  Genitourinary: Negative for dysuria, flank pain, frequency, hematuria and urgency.  Musculoskeletal: Negative for back pain, joint pain and myalgias.  Skin: Negative for rash.  Neurological: Negative for dizziness, tingling, focal weakness, seizures, weakness and headaches.  Endo/Heme/Allergies: Does not bruise/bleed easily.  Psychiatric/Behavioral: Negative for depression and suicidal ideas. The patient does not have insomnia.     Allergies  Allergen Reactions   Ketamine Anxiety and Other (See Comments)    Syncope episode/confusion     Patient Active Problem List   Diagnosis Date Noted   Small bowel obstruction due to adhesions ([place]) 06/21/2019   Acute gastroenteritis 06/14/2019   Intractable nausea and vomiting 06/07/2019   Chronic diastolic congestive heart failure ([place]) 05/30/2019   Chest pain in adult 05/19/2019   Intractable pain 05/19/2019   Cancer associated pain    Proctitis, radiation    Palliative care encounter    Encounter for monitoring opioid maintenance therapy 11/19/2018   Adrenal insufficiency ([place]) 09/03/2018   Chronic diarrhea 06/25/2018   Chronic anticoagulation 06/25/2018   Vomiting    Encounter for antineoplastic chemotherapy 06/04/2018   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   Lung metastasis ([place]) 05/15/2018   Closed fracture of distal end of radius 05/04/2018   History of total knee arthroplasty 04/24/2018   Osteoarthritis of left knee 02/28/2018   Condyloma acuminatum of anus s/p ablation 02/22/2018 02/22/2018   Condyloma acuminatum of vagina s/p ablation 02/22/2018 02/22/2018   Positive ANA (antinuclear antibody) 02/04/2018   Aortic atherosclerosis ([place]) 01/15/2018   Elevated MCV 01/15/2018   Anemia 01/15/2018     Genital warts 01/15/2018  Vitamin D deficiency 01/15/2018   Pernicious anemia    Impingement syndrome of shoulder region 12/19/2017   Multiple joint pain 12/19/2017   Osteoarthritis of right knee 12/19/2017   B12 deficiency 12/11/2017   Lung nodules 12/11/2017   History of Clostridium difficile infection 10/23/2017   Factor V Leiden ([place]) 10/19/2017   Goals of care, counseling/discussion 10/19/2017   Cervical cancer, FIGO stage IB1 ([place]) 09/18/2017   Chronic hepatitis C without hepatic coma ([place]) 09/18/2017   Cytopenia 09/18/2017   Diarrhea 09/18/2017   Lower abdominal pain 09/18/2017   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   Malignant neoplasm of overlapping sites of cervix ([place]) 09/18/2017   Renal insufficiency 09/18/2017   Wound infection after surgery 09/12/2017   Hypokalemia    Hypomagnesemia    Cervical arthritis 07/18/2017   Dysuria 06/20/2017   Metastatic cancer ([place]) 05/12/2017   Essential hypertension 03/15/2017   Anemia in other chronic diseases classified elsewhere 03/01/2017   Chemotherapy-induced neutropenia ([place]) 01/29/2017   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[place]) 09/25/2016     Past Medical History:  Diagnosis Date   Abdominal pain 06/10/2018   Abnormal cervical Papanicolaou smear 09/18/2017   Anxiety    Aortic atherosclerosis (HCC)    Arthritis    neck and knees   Blood clots in brain    both lungs and right kidney   Blood transfusion without reported diagnosis    Cervical cancer ([place]) 09/2016   mets lung   Chronic anal fissure    Chronic diarrhea    Dyspnea    Erosive gastropathy 09/18/2017   Factor V Leiden mutation ([place])    Fecal incontinence    Genital warts    GERD (gastroesophageal reflux disease)    GI bleed 10/08/2018   Heart murmur    Hematochezia    Hemorrhoids    Hepatitis C    Chronic, 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  Negative test 03/2228   Ileocolic anastomotic leak    Infarction of kidney (HCC) left kidney   and uterus   Intestinal infection due to Clostridium difficile 09/18/2017   Macrocytic anemia with vitamin B12 deficiency    Multiple gastric ulcers    Nausea vomiting and diarrhea    Pancolitis ([place]) 07/27/2018   Perianal condylomata    Pneumonia    History of   Pulmonary nodules    Rectal bleeding    Small bowel obstruction ([place]) 08/2017   Stiff neck    limited right turn   Vitamin D deficiency      Past Surgical History:  Procedure Laterality Date   CHOLECYSTECTOMY     COLON SURGERY  08/2017   resection   COLONOSCOPY WITH PROPOFOL N/A 12/20/2017   Procedure: COLONOSCOPY WITH PROPOFOL;  Surgeon: [name], MD;  Location: [place];  Service: Gastroenterology;  Laterality: N/A;   COLONOSCOPY WITH PROPOFOL N/A 07/30/2018   Procedure: COLONOSCOPY WITH PROPOFOL;  Surgeon: [name], MD;  Location: [facility] ENDOSCOPY;  Service: Gastroenterology;  Laterality: N/A;   COLONOSCOPY WITH PROPOFOL N/A 10/10/2018   Procedure: COLONOSCOPY WITH PROPOFOL;  Surgeon: Lucilla Lame, MD;  Location: [facility] ENDOSCOPY;  Service: Endoscopy;  Laterality: N/A;   DIAGNOSTIC LAPAROSCOPY     ESOPHAGOGASTRODUODENOSCOPY (EGD) WITH PROPOFOL N/A 12/20/2017   Procedure: ESOPHAGOGASTRODUODENOSCOPY (EGD) WITH PROPOFOL;  Surgeon: [name], MD;  Location: [facility] ENDOSCOPY;  Service: Gastroenterology;  Laterality: N/A;   ESOPHAGOGASTRODUODENOSCOPY (EGD) WITH PROPOFOL  07/30/2018   Procedure: ESOPHAGOGASTRODUODENOSCOPY (EGD) WITH PROPOFOL;  Surgeon: [name], MD;  Location: [facility] ENDOSCOPY;  Service: Gastroenterology;;   [place] N/A 11/21/2018   Procedure: FLEXIBLE SIGMOIDOSCOPY;  Surgeon: [name], MD;  Location: [facility] ENDOSCOPY;   Service: Gastroenterology;  Laterality: N/A;   LAPAROTOMY N/A 08/31/2017   Procedure: EXPLORATORY LAPAROTOMY for SBO, ileocolectomy, removal of piece of uterine wall;  Surgeon: [name], MD;  Location: [facility] ORS;  Service: General;  Laterality: N/A;   LASER ABLATION CONDOLAMATA N/A 02/22/2018   Procedure: LASER ABLATION/REMOVAL OF NOBSJGGEZMO AROUND ANUS AND VAGINA;  Surgeon: Michael Boston, MD;  Location: [place];  Service: General;  Laterality: N/A;   OOPHORECTOMY     PORTA CATH INSERTION N/A 05/13/2018   Procedure: PORTA CATH INSERTION;  Surgeon: Katha Cabal, MD;  Location: [place] CV LAB;  Service: Cardiovascular;  Laterality: N/A;   SMALL INTESTINE SURGERY     TANDEM RING INSERTION     x3   THORACOTOMY     TOTAL KNEE ARTHROPLASTY Left 04/24/2018   Procedure: TOTAL KNEE ARTHROPLASTY;  Surgeon: Lovell Sheehan, MD;  Location: [facility] ORS;  Service: Orthopedics;  Laterality: Left;    Social History   Socioeconomic History   Marital status: Divorced    Spouse name: Not on file   Number of children: Not on file   Years of education: Not on file   Highest education level: Not on file  Occupational History   Not on file  Social Needs   Financial resource strain: Not hard at all   Food insecurity    Worry: Never true    Inability: Never true   Transportation needs    Medical: No    Non-medical: No  Tobacco Use   Smoking status: Former Smoker    Quit date: 10/16/2006    Years since quitting: 12.6   Smokeless tobacco: Never Used  Substance and Sexual Activity   Alcohol use: Not Currently    Frequency: Never    Comment: seldom   Drug use: Yes    Types: Marijuana    Comment: not very often   Sexual activity: Not Currently    Birth control/protection: Post-menopausal    Comment: Not Asked  Lifestyle   Physical activity    Days per week: Patient refused    Minutes per session: Patient refused   Stress: Only a little    Relationships   Press photographer on phone: Patient refused    Gets together: Patient refused    Attends religious service: Patient refused    Active member of club or organization: Patient refused    Attends meetings of clubs or organizations: Patient refused    Relationship status: Patient refused   Intimate partner violence    Fear of current or ex partner: No    Emotionally abused: No    Physically abused: No    Forced sexual activity: No  Other Topics Concern   Not on file  Social History Narrative   Not on file     Family History  Problem Relation Age of Onset   Hypertension Father    Diabetes Father    Alcohol abuse Daughter    Hypertension Maternal Grandmother    Diabetes Maternal Grandmother    Diabetes Paternal Grandmother    Hypertension Paternal Grandmother      Current Facility-Administered Medications:  0.9 % NaCl with KCl 20 mEq/ L  infusion, , Intravenous, Continuous, Wieting, Richard, MD   albuterol (PROVENTIL) (2.5 MG/3ML) 0.083% nebulizer solution 3 mL, 3 mL, Inhalation, Q4H PRN, Ouma, Bing Neighbors, NP   heparin ADULT infusion 100 units/mL (25000 units/282m sodium chloride 0.45%), 1,300 Units/hr, Intravenous, Continuous, Ouma, EBing Neighbors NP, Last Rate: 13 mL/hr at 06/22/19 0849, 1,300 Units/hr at 06/22/19 0849   hydrocortisone (CORTEF) tablet 20 mg, 20 mg, Oral, Daily, Ouma, EBing Neighbors NP   hyoscyamine (LEVBID) 0.375 MG 12 hr tablet 0.375 mg, 0.375 mg, Oral, Q12H, Ouma, EBing Neighbors NP   ketorolac (TORADOL) 15 MG/ML injection 15 mg, 15 mg, Intravenous, Q6H PRN, OLang Snow NP, 15 mg at 06/21/19 2138   morphine 2 MG/ML injection 2 mg, 2 mg, Intravenous, Q3H PRN, OLang Snow NP, 2 mg at 06/22/19 0820   ondansetron ([facility] injection 4 mg, 4 mg, Intravenous, Q6H PRN, WLance Coon MD, 4 mg at 06/22/19 0827   pantoprazole (PROTONIX) injection 40 mg, 40 mg, Intravenous, Q24H,  Ouma, EBing Neighbors NP, 40 mg at 06/21/19 1524   phenol (CHLORASEPTIC) mouth spray 1 spray, 1 spray, Mouth/Throat, PRN, OLang Snow NP, 1 spray at 06/21/19 2358   promethazine (PHENERGAN) injection 25 mg, 25 mg, Intravenous, Q6H PRN, OLang Snow NP, 25 mg at 06/22/19 0409  Facility-Administered Medications Ordered in Other Encounters:    heparin lock flush 100 unit/mL, 500 Units, Intravenous, Once, Corcoran, Melissa C, MD   sodium chloride 0.9 % 1,000 mL with potassium chloride 20 mEq infusion, , Intravenous, Once, GHonor LohE, NP   sodium chloride flush (NS) 0.9 % injection 10 mL, 10 mL, Intravenous, Once, Borders, JLos Ebanos NP   Physical exam:  Vitals:   06/21/19 1706 06/21/19 1933 06/22/19 0531 06/22/19 0900  BP: (!) 162/85 (!) 149/83 (!) 142/86 (!) 140/91  Pulse: 71 65 70 77  Resp: 20 20 16 16   Temp: 97.9 F (36.6 C) 98 F (36.7 C) 98.3 F (36.8 C) (!) 97.5 F (36.4 C)  TempSrc: Oral Oral Oral Oral  SpO2: 100% 100% 99% 100%  Weight:      Height:       Physical Exam Constitutional:      Comments: Appears in distress due to abdominal pain  HENT:     Head: Normocephalic and atraumatic.  Eyes:     Pupils: Pupils are equal, round, and reactive to light.  Neck:     Musculoskeletal: Normal range of motion.  Cardiovascular:     Rate and Rhythm: Normal rate and regular rhythm.     Heart sounds: Normal heart sounds.  Pulmonary:     Effort: Pulmonary effort is normal.     Breath sounds: Normal breath sounds.  Abdominal:     Palpations: Abdomen is soft.     Comments: Diffuse TTP. Non distended. NG tube to suction  Skin:    General: Skin is warm and dry.  Neurological:     Mental Status: She is alert and oriented to person, place, and time.        CMP Latest Ref Rng & Units 06/21/2019  Glucose 70 - 99 mg/dL 115(H)  BUN 6 - 20 mg/dL 18  Creatinine 0.44 - 1.00 mg/dL 0.75  Sodium 135 - 145 mmol/L 137  Potassium 3.5 - 5.1 mmol/L 3.7    Chloride 98 - 111 mmol/L 103  CO2 22 - 32 mmol/L 23  Calcium 8.9 - 10.3 mg/dL 9.5  Total Protein 6.5 -  8.1 g/dL 7.5  Total Bilirubin 0.3 - 1.2 mg/dL 0.4  Alkaline Phos 38 - 126 U/L 145(H)  AST 15 - 41 U/L 40  ALT 0 - 44 U/L 72(H)   CBC Latest Ref Rng & Units 06/22/2019  WBC 4.0 - 10.5 K/uL 3.5(L)  Hemoglobin 12.0 - 15.0 g/dL 10.3(L)  Hematocrit 36.0 - 46.0 % 31.8(L)  Platelets 150 - 400 K/uL 124(L)    @IMAGES @  Ct Head Wo Contrast  Result Date: 06/07/2019 CLINICAL DATA:  Headache, history of metastatic cervical cancer EXAM: CT HEAD WITHOUT CONTRAST TECHNIQUE: Contiguous axial images were obtained from the base of the skull through the vertex without intravenous contrast. COMPARISON:  05/18/2019 FINDINGS: Brain: No evidence of acute infarction, hemorrhage, hydrocephalus, extra-axial collection or mass lesion/mass effect. Vascular: No hyperdense vessel or unexpected calcification. Skull: Normal. Negative for fracture or focal lesion. Sinuses/Orbits: The visualized paranasal sinuses are essentially clear. The mastoid air cells are unopacified. Other: None. IMPRESSION: Normal head CT. Electronically Signed   By: Julian Hy M.D.   On: 06/07/2019 11:04   Ct Angio Chest Pe W And/or Wo Contrast  Result Date: 05/23/2019 CLINICAL DATA:  Chest pain.  Metastatic cervical carcinoma EXAM: CT ANGIOGRAPHY CHEST WITH CONTRAST TECHNIQUE: Multidetector CT imaging of the chest was performed using the standard protocol during bolus administration of intravenous contrast. Multiplanar CT image reconstructions and MIPs were obtained to evaluate the vascular anatomy. CONTRAST:  89m OMNIPAQUE IOHEXOL 350 MG/ML SOLN COMPARISON:  Chest CT May 18, 2019 FINDINGS: Cardiovascular: There is no demonstrable pulmonary embolus. There is no thoracic aortic aneurysm or dissection. There is calcification at the origin of the right innominate artery. Other visualized great vessels appear unremarkable. There are scattered  foci of aortic atherosclerosis. There is no pericardial effusion or pericardial thickening evident. Port-A-Cath tip near cavoatrial junction. Mediastinum/Nodes: Visualized thyroid appears unremarkable. There is no appreciable thoracic adenopathy. No esophageal lesions evident. Lungs/Pleura: There are nodular metastases throughout the lungs bilaterally, several of which are cavitated, stable. Nodular opacities range in size from as small as 2 mm to as large as 1.6 cm. There is no edema or consolidation. No pleural effusions. Upper Abdomen: Gallbladder absent. Upper abdominal structures otherwise appear unremarkable. Musculoskeletal: No blastic or lytic bone lesions. No chest wall lesions evident. Note that the port is present anteriorly on the right, superficial to the pectoralis muscles. Review of the MIP images confirms the above findings. IMPRESSION: 1. No demonstrable pulmonary embolus. No thoracic aortic aneurysm or dissection. Foci of aortic atherosclerosis noted. 2. Nodular lesions throughout the lungs consistent with metastatic disease. Multiple parenchymal lung metastases show cavitation. Largest metastatic focus measures 1.6 cm in size. 3.  No evident adenopathy. 4.  Gallbladder absent. Aortic Atherosclerosis (ICD10-I70.0). Electronically Signed   By: WLowella GripIII M.D.   On: 05/23/2019 14:07   Ct Abdomen Pelvis W Contrast  Result Date: 06/21/2019 CLINICAL DATA:  Onset diarrhea and epigastric pain yesterday. History of metastatic cervical carcinoma. EXAM: CT ABDOMEN AND PELVIS WITH CONTRAST TECHNIQUE: Multidetector CT imaging of the abdomen and pelvis was performed using the standard protocol following bolus administration of intravenous contrast. CONTRAST:  100 mL OMNIPAQUE IOHEXOL 300 MG/ML  SOLN COMPARISON:  CT abdomen and pelvis 06/07/2019 and CT chest, abdomen and pelvis 04/21/2019. FINDINGS: Lower chest: No pleural or pericardial effusion. Multiple bilateral lower lobe pulmonary nodules  consistent with metastatic carcinoma are unchanged. Hepatobiliary: No focal liver lesion. The patient is status post cholecystectomy. Mild intra and extrahepatic biliary ductal prominence is  unchanged. Pancreas: Unremarkable. No pancreatic ductal dilatation or surrounding inflammatory changes. Spleen: Normal in size without focal abnormality. Adrenals/Urinary Tract: Single punctate nonobstructing stones in the kidneys are unchanged. No hydronephrosis or ureteral stone. Urinary bladder is unremarkable. Adrenal glands appear normal. Stomach/Bowel: The patient is status post right hemicolectomy. The anastomosis is normal in appearance. There is fecalization of small bowel contents in loops in the pelvis consistent with slow transit, new since the prior exams. A transition point is seen in the right lower quadrant of the abdomen on images 65-68 where more distal small bowel loops are completely decompressed. No pneumatosis, portal venous gas or free intraperitoneal air. The sigmoid colon is incompletely distended with thickening of the walls, unchanged. The stomach is unremarkable. Vascular/Lymphatic: Aortic atherosclerosis. No enlarged abdominal or pelvic lymph nodes. Reproductive: Status post hysterectomy. No adnexal masses. Other: None. Musculoskeletal: No acute or focal abnormality. Convex left scoliosis and multilevel degenerative change noted. IMPRESSION: The examination is positive for small bowel obstruction which appears to be due to adhesions with a transition point in the right lower quadrant. No CT signs of bowel ischemia. No change in multiple pulmonary nodules consistent with metastatic disease. No change in chronic wall thickening of the sigmoid colon compatible chronic colitis likely secondary to prior radiation therapy. Electronically Signed   By: Inge Rise M.D.   On: 06/21/2019 10:36   Ct Abdomen Pelvis W Contrast  Result Date: 06/07/2019 CLINICAL DATA:  Metastatic cervical cancer, nausea,  vomiting and diarrhea EXAM: CT ABDOMEN AND PELVIS WITH CONTRAST TECHNIQUE: Multidetector CT imaging of the abdomen and pelvis was performed using the standard protocol following bolus administration of intravenous contrast. CONTRAST:  198m OMNIPAQUE IOHEXOL 300 MG/ML  SOLN COMPARISON:  04/21/2019 FINDINGS: Lower chest: Continue progression of lower chest numerous bilateral pulmonary metastases. These have increased in size and number. Some are cavitary. No pleural effusion. No superimposed lower lobe consolidation or collapse. Normal heart size. No pericardial effusion. Hepatobiliary: Stable intra and extrahepatic biliary dilatation following cholecystectomy. No focal hepatic abnormality. Pancreas: Unremarkable. No pancreatic ductal dilatation or surrounding inflammatory changes. Spleen: Normal in size without focal abnormality. Adrenals/Urinary Tract: Adrenal glands are unremarkable. Kidneys are normal, focal lesion, or hydronephrosis. Bladder is unremarkable. Punctate nonobstructing subcentimeter bilateral intrarenal calculi. Stomach/Bowel: Negative for bowel obstruction, significant dilatation, ileus, or free air. Patient appears status post right colectomy. Distal colon demonstrates mild wall thickening of the descending colon, sigmoid rectum mucosal enhancement, similar to the prior study which may represent colitis. This could be related to prior pelvic radiation. No fluid collection, hemorrhage, hematoma, abscess, or ascites. Vascular/Lymphatic: Abdominal atherosclerosis without aneurysm occlusive process. No veno-occlusive disease. Mesenteric and renal vasculature appear patent. No bulky adenopathy. Reproductive: Atrophic uterus. No adnexal abnormality. No pelvic free fluid, fluid collection, or abscess. Other: Intact abdominal wall.  No hernia. Musculoskeletal: Osteopenia and degenerative change with associated scoliosis. No acute osseous finding. IMPRESSION: Continue progression of pulmonary metastases.  Stable biliary dilatation following cholecystectomy Similar distal colonic wall thickening and mucosal enhancement predominately of the sigmoid and rectum compatible with colitis, possibly secondary to radiation. Abdominal atherosclerosis without aneurysm Negative for bowel obstruction, free, or abscess. Nonobstructing nephrolithiasis. Electronically Signed   By: MJerilynn Mages  Shick M.D.   On: 06/07/2019 12:32   Dg Abd Portable 1 View  Result Date: 06/21/2019 CLINICAL DATA:  NG tube placement. History of cervical and lung cancer. EXAM: PORTABLE ABDOMEN - 1 VIEW COMPARISON:  CT abdomen pelvis-06/21/2019 FINDINGS: Enteric tube tip and side port project over the mid body  of the stomach. No pneumoperitoneum, pneumatosis or portal venous gas. Small amount of excreted contrast is seen within the bilateral renal collecting systems as a sequela of recent intravenous contrasted examination. Limited visualization of lower thorax demonstrates scattered bilateral pulmonary nodules compatible with known metastatic disease. Port a catheter tip projects over the superior cavoatrial junction. Post cholecystectomy. Mild-to-moderate scoliotic curvature of the thoracolumbar spine. IMPRESSION: Enteric tube tip and side port project over the mid body of the stomach. Electronically Signed   By: Sandi Mariscal M.D.   On: 06/21/2019 13:30   Dg Hip Unilat W Or Wo Pelvis 2-3 Views Right  Result Date: 06/07/2019 CLINICAL DATA:  Right hip pain for 1 week. No reported injury. History of pseudogout. EXAM: DG HIP (WITH OR WITHOUT PELVIS) 2-3V RIGHT COMPARISON:  04/21/2027 CT abdomen/pelvis. FINDINGS: There is no evidence of hip fracture or dislocation. There is no evidence of significant arthropathy or other focal bone abnormality. Degenerative changes in the visualized lower lumbar spine. IMPRESSION: No acute osseous abnormality. Electronically Signed   By: Ilona Sorrel M.D.   On: 06/07/2019 11:58    Assessment and plan- Patient is a 53 y.o. female  cervical adenocarcinoma currently on Keytruda admitted for small bowel obstruction  Presently conservative management is being followed for her small bowel obstruction which is possibly secondary to adhesions noted on CT abdomen.  Recommend consulting with surgery to see if there would be any contraindication giving Gastrografin contrast which can be administered through NG tube after NG tube suction which has been shown to be effective for the small bowel obstruction. Overall patient's prognosis from cervical adenocarcinoma seems to be poor but if he bowel obstruction does not resolve with conservative treatment she may need surgery.  Continue heparin gtt given prior h/o DVT/PE and need for possible surgery  If symptomatically patient is better and NG tube can be clamped in a few days- prn reglan may be considered when diet is advanced.    Thank you for this kind referral and the opportunity to participate in the care of this  Patient   Visit Diagnosis 1. Generalized abdominal pain   2. SBO (small bowel obstruction) ([place])   3. Metastatic cancer ([place])   4. Encounter for imaging study to confirm nasogastric (NG) tube placement   5. Small bowel obstruction (HCC)     Dr. Randa Evens, MD, MPH [facility] [phone] 06/22/2019 3:56 PM

## 2019-06-22 NOTE — Progress Notes (Signed)
Patient ID: Joanna Hall, female   DOB: 12/05/1965, 53 y.o.   MRN: [id number]  [employer] PROGRESS NOTE  Joanna Hall CVE:[id number] DOB: [birth year] DOA: 06/21/2019 PCP: Arnetha Courser, MD  HPI/Subjective: Patient feeling only a little bit better.  Had some nausea and vomiting despite the NG tube.  Still having abdominal pain.  Still having some diarrhea.  Still having chest pain and some shortness of breath.  Objective: Vitals:   06/22/19 0900 06/22/19 1225  BP: (!) 140/91 (!) 149/94  Pulse: 77 79  Resp: 16 18  Temp: (!) 97.5 F (36.4 C) 97.7 F (36.5 C)  SpO2: 100% 100%    Intake/Output Summary (Last 24 hours) at 06/22/2019 1412 Last data filed at 06/22/2019 1225 Gross per 24 hour  Intake 0.24 ml  Output 1300 ml  Net -1299.76 ml   Filed Weights   06/21/19 0819  Weight: 75.4 kg    ROS: Review of Systems  Constitutional: Negative for chills and fever.  Eyes: Negative for blurred vision.  Respiratory: Positive for shortness of breath. Negative for cough.   Cardiovascular: Positive for chest pain.  Gastrointestinal: Positive for abdominal pain, diarrhea, nausea and vomiting. Negative for constipation.  Genitourinary: Negative for dysuria.  Musculoskeletal: Negative for joint pain.  Neurological: Negative for dizziness and headaches.   Exam: Physical Exam  Constitutional: She is oriented to person, place, and time.  HENT:  Nose: No mucosal edema.  Mouth/Throat: No oropharyngeal exudate or posterior oropharyngeal edema.  Eyes: Pupils are equal, round, and reactive to light. Conjunctivae, EOM and lids are normal.  Neck: No JVD present. Carotid bruit is not present. No edema present. No thyroid mass and no thyromegaly present.  Cardiovascular: S1 normal and S2 normal. Exam reveals no gallop.  No murmur heard. Pulses:      Dorsalis pedis pulses are 2+ on the right side and 2+ on the left side.  Respiratory: No respiratory distress. She has decreased  breath sounds in the right lower field and the left lower field. She has no wheezes. She has no rhonchi. She has no rales.  GI: Soft. Bowel sounds are normal. There is abdominal tenderness.  Musculoskeletal:     Right ankle: She exhibits no swelling.     Left ankle: She exhibits no swelling.  Lymphadenopathy:    She has no cervical adenopathy.  Neurological: She is alert and oriented to person, place, and time. No cranial nerve deficit.  Skin: Skin is warm. No rash noted. Nails show no clubbing.  Psychiatric: She has a normal mood and affect.      Data Reviewed: Basic Metabolic Panel: Recent Labs  Lab 06/19/19 0805 06/21/19 0839  NA 134* 137  K 3.8 3.7  CL 101 103  CO2 26 23  GLUCOSE 99 115*  BUN 13 18  CREATININE 0.68 0.75  CALCIUM 8.9 9.5  MG 1.4* 1.5*  PHOS  --  3.6   Liver Function Tests: Recent Labs  Lab 06/19/19 0805 06/21/19 0839  AST 50* 40  ALT 69* 72*  ALKPHOS 126 145*  BILITOT 0.5 0.4  PROT 7.1 7.5  ALBUMIN 3.5 3.8   Recent Labs  Lab 06/21/19 0839  LIPASE 25   No results for input(s): AMMONIA in the last 168 hours. CBC: Recent Labs  Lab 06/19/19 0805 06/21/19 0839 06/22/19 0510  WBC 5.3 7.7 3.5*  NEUTROABS 3.6 5.9  --   HGB 11.5* 12.1 10.3*  HCT 35.4* 36.5 31.8*  MCV 100.9* 99.5 101.6*  PLT 165 193 124*   Cardiac Enzymes: No results for input(s): CKTOTAL, CKMB, CKMBINDEX, TROPONINI in the last 168 hours. BNP (last 3 results) Recent Labs    05/23/19 1219  BNP 23.0    ProBNP (last 3 results) No results for input(s): PROBNP in the last 8760 hours.  CBG: No results for input(s): GLUCAP in the last 168 hours.  Recent Results (from the past 240 hour(s))  SARS Coronavirus 2 [facility] order, Performed in [facility] hospital lab) Nasopharyngeal Nasopharyngeal Swab     Status: None   Collection Time: 06/14/19  1:33 AM   Specimen: Nasopharyngeal Swab  Result Value Ref Range Status   SARS Coronavirus 2 NEGATIVE NEGATIVE Final     Comment: (NOTE) If result is NEGATIVE SARS-CoV-2 target nucleic acids are NOT DETECTED. The SARS-CoV-2 RNA is generally detectable in upper and lower  respiratory specimens during the acute phase of infection. The lowest  concentration of SARS-CoV-2 viral copies this assay can detect is 250  copies / mL. A negative result does not preclude SARS-CoV-2 infection  and should not be used as the sole basis for treatment or other  patient management decisions.  A negative result may occur with  improper specimen collection / handling, submission of specimen other  than nasopharyngeal swab, presence of viral mutation(s) within the  areas targeted by this assay, and inadequate number of viral copies  (<250 copies / mL). A negative result must be combined with clinical  observations, patient history, and epidemiological information. If result is POSITIVE SARS-CoV-2 target nucleic acids are DETECTED. The SARS-CoV-2 RNA is generally detectable in upper and lower  respiratory specimens dur ing the acute phase of infection.  Positive  results are indicative of active infection with SARS-CoV-2.  Clinical  correlation with patient history and other diagnostic information is  necessary to determine patient infection status.  Positive results do  not rule out bacterial infection or co-infection with other viruses. If result is PRESUMPTIVE POSTIVE SARS-CoV-2 nucleic acids MAY BE PRESENT.   A presumptive positive result was obtained on the submitted specimen  and confirmed on repeat testing.  While 2019 novel coronavirus  (SARS-CoV-2) nucleic acids may be present in the submitted sample  additional confirmatory testing may be necessary for epidemiological  and / or clinical management purposes  to differentiate between  SARS-CoV-2 and other Sarbecovirus currently known to infect humans.  If clinically indicated additional testing with an alternate test  methodology [phone]) is advised. The SARS-CoV-2  RNA is generally  detectable in upper and lower respiratory sp ecimens during the acute  phase of infection. The expected result is Negative. Fact Sheet for Patients:  StrictlyIdeas.no Fact Sheet for Healthcare Providers: BankingDealers.co.za This test is not yet approved or cleared by the [place] FDA and has been authorized for detection and/or diagnosis of SARS-CoV-2 by FDA under an Emergency Use Authorization (EUA).  This EUA will remain in effect (meaning this test can be used) for the duration of the COVID-19 declaration under Section 564(b)(1) of the Act, 21 U.S.C. section 360bbb-3(b)(1), unless the authorization is terminated or revoked sooner. Performed at [facility], [address] [place], [place] [address]   C difficile quick scan w PCR reflex     Status: None   Collection Time: 06/14/19  3:00 PM   Specimen: STOOL  Result Value Ref Range Status   C Diff antigen NEGATIVE NEGATIVE Final   C Diff toxin NEGATIVE NEGATIVE Final   C Diff interpretation No C.  difficile detected.  Final    Comment: Performed at [facility], [address] [place], [place] [address]  Gastrointestinal Panel by PCR , Stool     Status: None   Collection Time: 06/14/19  3:00 PM   Specimen: Stool  Result Value Ref Range Status   Campylobacter species NOT DETECTED NOT DETECTED Final   Plesimonas shigelloides NOT DETECTED NOT DETECTED Final   Salmonella species NOT DETECTED NOT DETECTED Final   Yersinia enterocolitica NOT DETECTED NOT DETECTED Final   Vibrio species NOT DETECTED NOT DETECTED Final   Vibrio cholerae NOT DETECTED NOT DETECTED Final   Enteroaggregative E coli (EAEC) NOT DETECTED NOT DETECTED Final   Enteropathogenic E coli (EPEC) NOT DETECTED NOT DETECTED Final   Enterotoxigenic E coli (ETEC) NOT DETECTED NOT DETECTED Final   Shiga like toxin producing E coli (STEC) NOT DETECTED NOT DETECTED Final    Shigella/Enteroinvasive E coli (EIEC) NOT DETECTED NOT DETECTED Final   Cryptosporidium NOT DETECTED NOT DETECTED Final   Cyclospora cayetanensis NOT DETECTED NOT DETECTED Final   Entamoeba histolytica NOT DETECTED NOT DETECTED Final   Giardia lamblia NOT DETECTED NOT DETECTED Final   Adenovirus F40/41 NOT DETECTED NOT DETECTED Final   Astrovirus NOT DETECTED NOT DETECTED Final   Norovirus GI/GII NOT DETECTED NOT DETECTED Final   Rotavirus A NOT DETECTED NOT DETECTED Final   Sapovirus (I, II, IV, and V) NOT DETECTED NOT DETECTED Final    Comment: Performed at [facility], [address] [place], [place] [address]  SARS Coronavirus 2 [facility] order, Performed in [facility] hospital lab) Nasopharyngeal Nasopharyngeal Swab     Status: None   Collection Time: 06/21/19  8:42 AM   Specimen: Nasopharyngeal Swab  Result Value Ref Range Status   SARS Coronavirus 2 NEGATIVE NEGATIVE Final    Comment: (NOTE) If result is NEGATIVE SARS-CoV-2 target nucleic acids are NOT DETECTED. The SARS-CoV-2 RNA is generally detectable in upper and lower  respiratory specimens during the acute phase of infection. The lowest  concentration of SARS-CoV-2 viral copies this assay can detect is 250  copies / mL. A negative result does not preclude SARS-CoV-2 infection  and should not be used as the sole basis for treatment or other  patient management decisions.  A negative result may occur with  improper specimen collection / handling, submission of specimen other  than nasopharyngeal swab, presence of viral mutation(s) within the  areas targeted by this assay, and inadequate number of viral copies  (<250 copies / mL). A negative result must be combined with clinical  observations, patient history, and epidemiological information. If result is POSITIVE SARS-CoV-2 target nucleic acids are DETECTED. The SARS-CoV-2 RNA is generally detectable in upper and lower  respiratory specimens dur ing the acute  phase of infection.  Positive  results are indicative of active infection with SARS-CoV-2.  Clinical  correlation with patient history and other diagnostic information is  necessary to determine patient infection status.  Positive results do  not rule out bacterial infection or co-infection with other viruses. If result is PRESUMPTIVE POSTIVE SARS-CoV-2 nucleic acids MAY BE PRESENT.   A presumptive positive result was obtained on the submitted specimen  and confirmed on repeat testing.  While 2019 novel coronavirus  (SARS-CoV-2) nucleic acids may be present in the submitted sample  additional confirmatory testing may be necessary for epidemiological  and / or clinical management purposes  to differentiate between  SARS-CoV-2 and other Sarbecovirus currently known to infect humans.  If clinically indicated additional testing with an alternate test  methodology [phone]) is advised. The SARS-CoV-2 RNA is generally  detectable in upper and lower respiratory sp ecimens during the acute  phase of infection. The expected result is Negative. Fact Sheet for Patients:  StrictlyIdeas.no Fact Sheet for Healthcare Providers: BankingDealers.co.za This test is not yet approved or cleared by the [place] FDA and has been authorized for detection and/or diagnosis of SARS-CoV-2 by FDA under an Emergency Use Authorization (EUA).  This EUA will remain in effect (meaning this test can be used) for the duration of the COVID-19 declaration under Section 564(b)(1) of the Act, 21 U.S.C. section 360bbb-3(b)(1), unless the authorization is terminated or revoked sooner. Performed at [facility], [address] [place], [place] [address]   C difficile quick scan w PCR reflex     Status: None   Collection Time: 06/21/19  4:44 PM   Specimen: STOOL  Result Value Ref Range Status   C Diff antigen NEGATIVE NEGATIVE Final   C Diff toxin NEGATIVE  NEGATIVE Final   C Diff interpretation No C. difficile detected.  Final    Comment: Performed at [facility], [address] [place], [place] [address]     Studies: Ct Abdomen Pelvis W Contrast  Result Date: 06/21/2019 CLINICAL DATA:  Onset diarrhea and epigastric pain yesterday. History of metastatic cervical carcinoma. EXAM: CT ABDOMEN AND PELVIS WITH CONTRAST TECHNIQUE: Multidetector CT imaging of the abdomen and pelvis was performed using the standard protocol following bolus administration of intravenous contrast. CONTRAST:  100 mL OMNIPAQUE IOHEXOL 300 MG/ML  SOLN COMPARISON:  CT abdomen and pelvis 06/07/2019 and CT chest, abdomen and pelvis 04/21/2019. FINDINGS: Lower chest: No pleural or pericardial effusion. Multiple bilateral lower lobe pulmonary nodules consistent with metastatic carcinoma are unchanged. Hepatobiliary: No focal liver lesion. The patient is status post cholecystectomy. Mild intra and extrahepatic biliary ductal prominence is unchanged. Pancreas: Unremarkable. No pancreatic ductal dilatation or surrounding inflammatory changes. Spleen: Normal in size without focal abnormality. Adrenals/Urinary Tract: Single punctate nonobstructing stones in the kidneys are unchanged. No hydronephrosis or ureteral stone. Urinary bladder is unremarkable. Adrenal glands appear normal. Stomach/Bowel: The patient is status post right hemicolectomy. The anastomosis is normal in appearance. There is fecalization of small bowel contents in loops in the pelvis consistent with slow transit, new since the prior exams. A transition point is seen in the right lower quadrant of the abdomen on images 65-68 where more distal small bowel loops are completely decompressed. No pneumatosis, portal venous gas or free intraperitoneal air. The sigmoid colon is incompletely distended with thickening of the walls, unchanged. The stomach is unremarkable. Vascular/Lymphatic: Aortic atherosclerosis. No enlarged  abdominal or pelvic lymph nodes. Reproductive: Status post hysterectomy. No adnexal masses. Other: None. Musculoskeletal: No acute or focal abnormality. Convex left scoliosis and multilevel degenerative change noted. IMPRESSION: The examination is positive for small bowel obstruction which appears to be due to adhesions with a transition point in the right lower quadrant. No CT signs of bowel ischemia. No change in multiple pulmonary nodules consistent with metastatic disease. No change in chronic wall thickening of the sigmoid colon compatible chronic colitis likely secondary to prior radiation therapy. Electronically Signed   By: Inge Rise M.D.   On: 06/21/2019 10:36   Dg Abd Portable 1 View  Result Date: 06/21/2019 CLINICAL DATA:  NG tube placement. History of cervical and lung cancer. EXAM: PORTABLE ABDOMEN - 1 VIEW COMPARISON:  CT abdomen pelvis-06/21/2019 FINDINGS: Enteric  tube tip and side port project over the mid body of the stomach. No pneumoperitoneum, pneumatosis or portal venous gas. Small amount of excreted contrast is seen within the bilateral renal collecting systems as a sequela of recent intravenous contrasted examination. Limited visualization of lower thorax demonstrates scattered bilateral pulmonary nodules compatible with known metastatic disease. Port a catheter tip projects over the superior cavoatrial junction. Post cholecystectomy. Mild-to-moderate scoliotic curvature of the thoracolumbar spine. IMPRESSION: Enteric tube tip and side port project over the mid body of the stomach. Electronically Signed   By: Sandi Mariscal M.D.   On: 06/21/2019 13:30    Scheduled Meds: . diatrizoate meglumine-sodium  90 mL Per Tube Once  . hydrocortisone  20 mg Oral Daily  . hyoscyamine  0.375 mg Oral Q12H  . pantoprazole (PROTONIX) IV  40 mg Intravenous Q24H   Continuous Infusions: . 0.9 % NaCl with KCl 20 mEq / L 75 mL/hr at 06/22/19 1225  . heparin 1,300 Units/hr (06/22/19 0849)     Assessment/Plan:  1. Small bowel obstruction.  Patient has numerous episodes of this in the past.  Has had prior abdominal surgeries.  General surgery following and would like to do conservative management.  Continue IV fluids.  As needed nausea and pain medication. 2. Chronic colitis and diarrhea.  With bowel obstruction this may change shortly. 3. Hypomagnesemia.  Replace magnesium 4. Metastatic cervical cancer 5. Chronic hepatitis C 6. 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  Change oral medication to IV heparin drip.  Code Status:     Code Status Orders  (From admission, onward)         Start     Ordered   06/21/19 1306  Full code  Continuous     06/21/19 1305        Code Status History    Date Active Date Inactive Code Status Order ID Comments User Context   06/14/2019 0418 06/15/2019 1902 Full Code [id number]  Sidney Ace Arvella Merles, MD ED   06/07/2019 1536 06/10/2019 2044 Full Code [id number]  Sela Hua, MD Inpatient   05/19/2019 0313 05/22/2019 1545 Full Code [id number]  Mayer Camel, NP Inpatient   11/19/2018 1803 11/26/2018 2238 Full Code [id number]  Nicholes Mango, MD Inpatient   10/17/2018 1910 10/18/2018 2119 Full Code [id number]  Hillary Bow, MD Inpatient   10/08/2018 1443 10/11/2018 1920 Full Code [id number]  Nicholes Mango, MD Inpatient   07/27/2018 1621 08/01/2018 1432 Full Code [id number]  Demetrios Loll, MD Inpatient   06/10/2018 1754 06/12/2018 1614 Full Code [id number]  Dustin Flock, MD Inpatient   04/24/2018 1451 04/27/2018 1700 Full Code [id number]  Lovell Sheehan, MD Inpatient   12/03/2017 1619 12/08/2017 1606 Full Code [id number]  Epifanio Lesches, MD ED   09/12/2017 0024 09/15/2017 1721 Full Code [id number]  [name], MD ED   08/27/2017 0223 09/05/2017 1355 Full Code [id number]  Herbert Pun, MD Inpatient   Advance Care Planning Activity     Family Communication: Patient deferred me calling family. Disposition Plan: Unclear at this  point  Consultants:  General surgery  Oncology  Time spent: 28 minutes including ACP time  [employer]

## 2019-06-22 NOTE — Plan of Care (Signed)
Patient's NG tube fell out during ambulation today and a new one was inserted. Patient tolerated NG tube insertion well. Managing severe pain was a priority today, and contrast was administered via NG tube. Marry Guan, RN 06/22/2019 7:03 PM   Problem: Activity: Goal: Risk for activity intolerance will decrease Outcome: Progressing   Problem: Education: Goal: Knowledge of General Education information will improve Description: Including pain rating scale, medication(s)/side effects and non-pharmacologic comfort measures Outcome: Progressing

## 2019-06-22 NOTE — Progress Notes (Signed)
ANTICOAGULATION CONSULT NOTE -  Pharmacy Consult for Heparin  Indication: DVT  Allergies  Allergen Reactions  . Ketamine Anxiety and Other (See Comments)    Syncope episode/confusion    Patient Measurements: Height: 5' 8"  (172.7 cm) Weight: 166 lb 3.2 oz (75.4 kg) IBW/kg (Calculated) : 63.9 Heparin Dosing Weight: 75.4 kg   Vital Signs: Temp: 98.3 F (36.8 C) (09/06 0531) Temp Source: Oral (09/06 0531) BP: 142/86 (09/06 0531) Pulse Rate: 70 (09/06 0531)  Labs: Recent Labs    06/19/19 0805 06/21/19 0839 06/21/19 1817 06/22/19 0059 06/22/19 0510  HGB 11.5* 12.1  --   --  10.3*  HCT 35.4* 36.5  --   --  31.8*  PLT 165 193  --   --  124*  APTT  --   --  24 96* 98*  LABPROT  --   --  12.8  --   --   INR  --   --  1.0  --   --   HEPARINUNFRC  --   --  <0.10*  --  0.78*  CREATININE 0.68 0.75  --   --   --     Estimated Creatinine Clearance: 83.9 mL/min (by C-G formula based on SCr of 0.75 mg/dL).   Medical History: Past Medical History:  Diagnosis Date  . Abdominal pain 06/10/2018  . Abnormal cervical Papanicolaou smear 09/18/2017  . Anxiety   . Aortic atherosclerosis ([place])   . Arthritis    neck and knees  . 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 Blood transfusion without reported diagnosis   . Cervical cancer ([place]) 09/2016   mets lung  . Chronic anal fissure   . Chronic diarrhea   . Dyspnea   . Erosive gastropathy 09/18/2017  . Factor V Leiden mutation ([place])   . Fecal incontinence   . Genital warts   . GERD (gastroesophageal reflux disease)   . GI bleed 10/08/2018  . Heart murmur   . Hematochezia   . Hemorrhoids   . 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 Hepatitis, chronic ([place]) 05/05/2017  . History of cancer chemotherapy    completed 06/2017  . History of Clostridium difficile infection    while undergoing chemo.  Negative test 10/2017  . Ileocolic anastomotic leak   . 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 Intestinal infection due to Clostridium difficile 09/18/2017  . Macrocytic anemia with vitamin B12 deficiency   . Multiple gastric ulcers   . Nausea vomiting and diarrhea   . Pancolitis ([place]) 07/27/2018  . Perianal condylomata   . Pneumonia    History of  . Pulmonary nodules   . Rectal bleeding   . Small bowel obstruction ([place]) 08/2017  . Stiff neck    limited right turn  . Vitamin D deficiency     Medications:  Medications Prior to Admission  Medication Sig Dispense Refill Last Dose  . amitriptyline (ELAVIL) 75 MG tablet Take 1 tablet (75 mg total) by mouth at bedtime. 90 tablet 1 06/20/2019 at Unknown time  . Calcium Carb-Cholecalciferol (CALCIUM 500 +D) 500-400 MG-UNIT TABS Take 2 tablets by mouth daily.   06/20/2019 at Unknown time  . diphenoxylate-atropine (LOMOTIL) 2.5-0.025 MG tablet TAKE (2) TABLETS FOUR TIMES DAILY. 240 tablet 0 06/20/2019 at Unknown time  . famotidine (PEPCID) 20 MG tablet Take 1 tablet (20 mg total) by mouth 2 (  two) times daily for 5 days. 10 tablet 0 06/20/2019 at Unknown time  . feeding supplement, ENSURE ENLIVE, (ENSURE ENLIVE) LIQD Take 237 mLs by mouth 3 (three) times daily between meals. 237 mL 12 unknown at unknown  . hydrocortisone (CORTEF) 20 MG tablet Take 1 tablet (20 mg total) by mouth daily. 30 tablet 0 06/20/2019 at Unknown time  . hydrOXYzine (ATARAX/VISTARIL) 10 MG tablet Take 1 tablet (10 mg total) by mouth 3 (three) times daily as needed. 60 tablet 0 prn at prn  . hyoscyamine (LEVBID) 0.375 MG 12 hr tablet Take 1 tablet (0.375 mg total)by mouth 2 (two) times daily. 60 tablet 0 06/20/2019 at Unknown time  . Multiple Vitamins-Minerals (MULTIVITAMIN WITH MINERALS) tablet Take 1 tablet by mouth daily. 30 tablet 0 unknown at unknown  . ondansetron (ZOFRAN ODT) 4 MG disintegrating tablet Take 1 tablet (4 mg total) by mouth every 8 (eight) hours as needed for nausea or vomiting. 30 tablet 2 prn at prn  . oxyCODONE (OXYCONTIN) 15 mg 12 hr tablet  Take 1 tablet (15 mg total) by mouth every 12 (twelve) hours. 60 tablet 0 06/20/2019 at Unknown time  . pantoprazole (PROTONIX) 40 MG tablet Take 1 tablet (40 mg total) by mouth daily. 90 tablet 1 06/20/2019 at Unknown time  . potassium chloride SA (K-DUR) 20 MEQ tablet Take 1 tablet (20 mEq total) by mouth daily as needed (if having nausea/vomiting- to supplements for lost potassium). 30 tablet 0 prn at prn  . promethazine (PHENERGAN) 25 MG tablet Take 1 tablet (25 mg total) by mouth every 8 (eight) hours as needed for nausea or vomiting. 30 tablet 3 prn at prn  . rivaroxaban (XARELTO) 20 MG TABS tablet Take 1 tablet (20 mg total) by mouth daily with supper. (Patient taking differently: Take 20 mg by mouth daily with supper. Takes in the morning) 30 tablet 6 06/20/2019 at Unknown time  . VENTOLIN HFA 108 (90 Base) MCG/ACT inhaler Inhale 1-2 puffs into the lungs every 4 (four) hours as needed for shortness of breath. 1 Inhaler 1 prn at prn  . Oxycodone HCl 10 MG TABS Take 1 tablet (10 mg total) by mouth every 6 (six) hours as needed (for breakthrough pain). 120 tablet 0 prn at prn    Assessment: Pharmacy consulted to dose heparin in this 53 year old female admitted with DVT.  Pt was on Xarelto 20 mg PO daily PTA, last dose was on 9/4 in AM.  CrCl = 83.9 ml/min  9/6 @ 0059 aPTT 96, therapeutic x 1 9/6 @ 0510 aPTT 98, therapeutic x 2.  HL does not correlate w/ aPTT.  H/H and PLTs are worse  Goal of Therapy:  Heparin level 0.3-0.7 units/ml aPTT 66 - 102  seconds Monitor platelets by anticoagulation protocol: Yes   Plan:  Continue heparin drip @ 1300 units/hr. Recheck aPTT and CBC daily Will use aPTT to guide dosing until aPTT and HL are both therapeutic and correlate. Will check HL on 9/7 with AM labs.  Monitor for s/sx of bleeding complications  Hart Robinsons A 06/22/2019,5:58 AM

## 2019-06-22 NOTE — Progress Notes (Signed)
Initial Nutrition Assessment  DOCUMENTATION CODES:   Non-severe (moderate) malnutrition in context of chronic illness  INTERVENTION:  Will monitor for plan of care and for diet advancement.   Once diet advanced recommend: -Ensure Enlive po TID, each supplement provides 350 kcal and 20 grams of protein -Daily MVI  Recommend outpatient vitamin B12 injections since patient does not have a terminal ileum.  NUTRITION DIAGNOSIS:   Moderate Malnutrition related to chronic illness(metastatic adenocarcinoma of cervix, chronic diarrhea following right ileocolectomy 08/2017) as evidenced by mild fat depletion, mild-moderate muscle depletion.  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   Met with patient at bedside. She is known to this RD from previous admissions. She reports her appetite has remained decreased. She only eats 1-2 small meals per day. She had not been drinking Ensure or other ONS regularly because she thought they all contained lactose. Discussed that most ONS do not contain lactose and how to read the bottle to determine if they do. She reports she is still struggling with some nausea today. She has NGT in place to LIS. She also reports she is still struggling with chronic diarrhea. She reports she is taking a break from [place] but wants to try again in a month or so. She used to get vitamin B12 shots but reports she has not had a  shot in a while.  Patient initially lost weight when she first started treatment for her cervical cancer. She has now been weight-stable for a while per her report. She is currently 75.4 kg (166.2 lbs).  Medications reviewed and include: Gastrografin 90 mL per tube once today, hydrocortisone 20 mg daily, pantoprazole, NS at 75 mL/hr, heparin gtt.  Labs reviewed: Magnesium 1.5 on 9/5.  NUTRITION - FOCUSED PHYSICAL EXAM:    Most Recent Value  Orbital Region  Mild depletion  Upper Arm Region  Moderate depletion  Thoracic and Lumbar Region  Mild depletion  Buccal Region  Mild depletion  Temple Region  Mild depletion  Clavicle Bone Region  Moderate depletion  Clavicle and Acromion Bone Region  Moderate depletion  Scapular Bone Region  Moderate depletion  Dorsal Hand  Mild depletion  Patellar Region  Mild depletion  Anterior Thigh Region  Mild depletion  Posterior Calf Region  Mild depletion  Edema (RD Assessment)  None  Hair  Reviewed  Eyes  Reviewed  Mouth  Reviewed  Skin  Reviewed  Nails  Reviewed     Diet Order:   Diet Order            Diet NPO time specified  Diet effective now             EDUCATION NEEDS:   No education needs have been identified at this time  Skin:  Skin Assessment: Reviewed RN Assessment  Last BM:  06/22/2019 per chart  Height:   Ht Readings from Last 1 Encounters:  06/21/19 5' 8"  (1.727 m)   Weight:   Wt Readings  from Last 1 Encounters:  06/21/19 75.4 kg   Ideal Body Weight:  63.6 kg  BMI:  Body mass index is 25.27 kg/m.  Estimated Nutritional Needs:   Kcal:  1638-4665  Protein:  90-100 grams  Fluid:  1.8-2 L/day  Willey Blade, MS, RD, LDN Office: [phone] Pager: [phone] After Hours/Weekend Pager: [phone]

## 2019-06-22 NOTE — Progress Notes (Addendum)
Subjective:  CC: Joanna Hall is a 53 y.o. female  Hospital stay day 1,   Small bowel obstruction.  HPI: No new issues overnight.  Had 2 additional episodes of diarrhea.  She had to strain so hard she had an episode of emesis shortly after the second episode.  This is a chronic issue with her.  Despite all this she still had 600 mL's of NG output recorded  ROS:  General: Denies weight loss, weight gain, fatigue, fevers, chills, and night sweats. Heart: Denies chest pain, palpitations, racing heart, irregular heartbeat, leg pain or swelling, and decreased activity tolerance. Respiratory: Denies breathing difficulty, shortness of breath, wheezing, cough, and sputum. GI: Denies change in appetite, heartburn, and blood in stool. GU: Denies difficulty urinating, pain with urinating, urgency, frequency, blood in urine.   Objective:   Temp:  [97.5 F (36.4 C)-98.6 F (37 C)] 97.5 F (36.4 C) (09/06 0900) Pulse Rate:  [65-92] 77 (09/06 0900) Resp:  [16-28] 16 (09/06 0900) BP: (80-162)/(51-95) 140/91 (09/06 0900) SpO2:  [99 %-100 %] 100 % (09/06 0900)     Height: 5' 8"  (172.7 cm) Weight: 75.4 kg BMI (Calculated): 25.28   Intake/Output this shift:   Intake/Output Summary (Last 24 hours) at 06/22/2019 1054 Last data filed at 06/22/2019 0910 Gross per 24 hour  Intake 1000.24 ml  Output 1201 ml  Net -200.76 ml    Constitutional :  alert, cooperative, appears stated age and no distress  Respiratory:  clear to auscultation bilaterally  Cardiovascular:  regular rate and rhythm  Gastrointestinal: Soft, no guarding, but still complains of tenderness in all 4 quadrants.  Slightly improved?.   Skin: Cool and moist.   Psychiatric: Normal affect, non-agitated, not confused       LABS:  CMP Latest Ref Rng & Units 06/21/2019 06/19/2019 06/15/2019  Glucose 70 - 99 mg/dL 115(H) 99 77  BUN 6 - 20 mg/dL 18 13 8   Creatinine 0.44 - 1.00 mg/dL 0.75 0.68 0.72  Sodium 135 - 145 mmol/L 137 134(L) 139   Potassium 3.5 - 5.1 mmol/L 3.7 3.8 4.3  Chloride 98 - 111 mmol/L 103 101 105  CO2 22 - 32 mmol/L 23 26 27   Calcium 8.9 - 10.3 mg/dL 9.5 8.9 8.9  Total Protein 6.5 - 8.1 g/dL 7.5 7.1 -  Total Bilirubin 0.3 - 1.2 mg/dL 0.4 0.5 -  Alkaline Phos 38 - 126 U/L 145(H) 126 -  AST 15 - 41 U/L 40 50(H) -  ALT 0 - 44 U/L 72(H) 69(H) -   CBC Latest Ref Rng & Units 06/22/2019 06/21/2019 06/19/2019  WBC 4.0 - 10.5 K/uL 3.5(L) 7.7 5.3  Hemoglobin 12.0 - 15.0 g/dL 10.3(L) 12.1 11.5(L)  Hematocrit 36.0 - 46.0 % 31.8(L) 36.5 35.4(L)  Platelets 150 - 400 K/uL 124(L) 193 165    RADS: n/a Assessment:   Small bowel obstruction noted on CT scan, however patient continues to have intermittent diarrhea Rea along with the nausea.  NG output is still high as well.  Unsure why she has such high NG output and yet still able to have bouts of diarrhea.  Her complicated surgical history and medical history is making a clear diagnosis difficult.  Due to the overall unchanged symptomatology, will continue with NG tube decompression until at least the NG output decreases and/or her abdominal pain improves.  Keep n.p.o. in the meantime.  I would highly recommend a GI consult regarding her persistent diarrhea to see if there is any acute intervention needed  for this chronic issue.  SHe has been seeing Dr. Marius Ditch for this.  Factor V leiden- heparin drip in preparation for failed medical management forcing [place] to proceed with surgery.  Stage IV cervical cancer, Hep C- continue management per primary team

## 2019-06-22 NOTE — Progress Notes (Signed)
ANTICOAGULATION CONSULT NOTE -  Pharmacy Consult for Heparin  Indication: DVT  Allergies  Allergen Reactions  . Ketamine Anxiety and Other (See Comments)    Syncope episode/confusion    Patient Measurements: Height: 5' 8"  (172.7 cm) Weight: 166 lb 3.2 oz (75.4 kg) IBW/kg (Calculated) : 63.9 Heparin Dosing Weight: 75.4 kg   Vital Signs: Temp: 98 F (36.7 C) (09/05 1933) Temp Source: Oral (09/05 1933) BP: 149/83 (09/05 1933) Pulse Rate: 65 (09/05 1933)  Labs: Recent Labs    06/19/19 0805 06/21/19 0839 06/21/19 1817 06/22/19 0059  HGB 11.5* 12.1  --   --   HCT 35.4* 36.5  --   --   PLT 165 193  --   --   APTT  --   --  24 96*  LABPROT  --   --  12.8  --   INR  --   --  1.0  --   HEPARINUNFRC  --   --  <0.10*  --   CREATININE 0.68 0.75  --   --     Estimated Creatinine Clearance: 83.9 mL/min (by C-G formula based on SCr of 0.75 mg/dL).   Medical History: Past Medical History:  Diagnosis Date  . Abdominal pain 06/10/2018  . Abnormal cervical Papanicolaou smear 09/18/2017  . Anxiety   . Aortic atherosclerosis ([place])   . Arthritis    neck and knees  . 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 Blood transfusion without reported diagnosis   . Cervical cancer ([place]) 09/2016   mets lung  . Chronic anal fissure   . Chronic diarrhea   . Dyspnea   . Erosive gastropathy 09/18/2017  . Factor V Leiden mutation ([place])   . Fecal incontinence   . Genital warts   . GERD (gastroesophageal reflux disease)   . GI bleed 10/08/2018  . Heart murmur   . Hematochezia   . Hemorrhoids   . 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 Hepatitis, chronic ([place]) 05/05/2017  . History of cancer chemotherapy    completed 06/2017  . History of Clostridium difficile infection    while undergoing chemo.  Negative test 10/2017  . Ileocolic anastomotic leak   . 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 Intestinal infection due to Clostridium  difficile 09/18/2017  . Macrocytic anemia with vitamin B12 deficiency   . Multiple gastric ulcers   . Nausea vomiting and diarrhea   . Pancolitis ([place]) 07/27/2018  . Perianal condylomata   . Pneumonia    History of  . Pulmonary nodules   . Rectal bleeding   . Small bowel obstruction ([place]) 08/2017  . Stiff neck    limited right turn  . Vitamin D deficiency     Medications:  Medications Prior to Admission  Medication Sig Dispense Refill Last Dose  . amitriptyline (ELAVIL) 75 MG tablet Take 1 tablet (75 mg total) by mouth at bedtime. 90 tablet 1 06/20/2019 at Unknown time  . Calcium Carb-Cholecalciferol (CALCIUM 500 +D) 500-400 MG-UNIT TABS Take 2 tablets by mouth daily.   06/20/2019 at Unknown time  . diphenoxylate-atropine (LOMOTIL) 2.5-0.025 MG tablet TAKE (2) TABLETS FOUR TIMES DAILY. 240 tablet 0 06/20/2019 at Unknown time  . famotidine (PEPCID) 20 MG tablet Take 1 tablet (20 mg total) by mouth 2 (two) times daily for 5 days. 10 tablet 0 06/20/2019 at Unknown time  . feeding  supplement, ENSURE ENLIVE, (ENSURE ENLIVE) LIQD Take 237 mLs by mouth 3 (three) times daily between meals. 237 mL 12 unknown at unknown  . hydrocortisone (CORTEF) 20 MG tablet Take 1 tablet (20 mg total) by mouth daily. 30 tablet 0 06/20/2019 at Unknown time  . hydrOXYzine (ATARAX/VISTARIL) 10 MG tablet Take 1 tablet (10 mg total) by mouth 3 (three) times daily as needed. 60 tablet 0 prn at prn  . hyoscyamine (LEVBID) 0.375 MG 12 hr tablet Take 1 tablet (0.375 mg total)by mouth 2 (two) times daily. 60 tablet 0 06/20/2019 at Unknown time  . Multiple Vitamins-Minerals (MULTIVITAMIN WITH MINERALS) tablet Take 1 tablet by mouth daily. 30 tablet 0 unknown at unknown  . ondansetron (ZOFRAN ODT) 4 MG disintegrating tablet Take 1 tablet (4 mg total) by mouth every 8 (eight) hours as needed for nausea or vomiting. 30 tablet 2 prn at prn  . oxyCODONE (OXYCONTIN) 15 mg 12 hr tablet Take 1 tablet (15 mg total) by mouth every 12 (twelve)  hours. 60 tablet 0 06/20/2019 at Unknown time  . pantoprazole (PROTONIX) 40 MG tablet Take 1 tablet (40 mg total) by mouth daily. 90 tablet 1 06/20/2019 at Unknown time  . potassium chloride SA (K-DUR) 20 MEQ tablet Take 1 tablet (20 mEq total) by mouth daily as needed (if having nausea/vomiting- to supplements for lost potassium). 30 tablet 0 prn at prn  . promethazine (PHENERGAN) 25 MG tablet Take 1 tablet (25 mg total) by mouth every 8 (eight) hours as needed for nausea or vomiting. 30 tablet 3 prn at prn  . rivaroxaban (XARELTO) 20 MG TABS tablet Take 1 tablet (20 mg total) by mouth daily with supper. (Patient taking differently: Take 20 mg by mouth daily with supper. Takes in the morning) 30 tablet 6 06/20/2019 at Unknown time  . VENTOLIN HFA 108 (90 Base) MCG/ACT inhaler Inhale 1-2 puffs into the lungs every 4 (four) hours as needed for shortness of breath. 1 Inhaler 1 prn at prn  . Oxycodone HCl 10 MG TABS Take 1 tablet (10 mg total) by mouth every 6 (six) hours as needed (for breakthrough pain). 120 tablet 0 prn at prn    Assessment: Pharmacy consulted to dose heparin in this 53 year old female admitted with DVT.  Pt was on Xarelto 20 mg PO daily PTA, last dose was on 9/4 in AM.  CrCl = 83.9 ml/min  9/6 @ 0059 aPTT 96, therapeutic  Goal of Therapy:  Heparin level 0.3-0.7 units/ml aPTT 66 - 102  seconds Monitor platelets by anticoagulation protocol: Yes   Plan:  Continue heparin drip @ 1300 units/hr. Recheck aPTT in 6 hours to confirm Will use aPTT to guide dosing until aPTT and HL are both therapeutic.  Will check HL on 9/6 with AM labs.   [place] Crane, Juliocesar Blasius A 06/22/2019,1:44 AM

## 2019-06-23 ENCOUNTER — Inpatient Hospital Stay: Payer: Medicaid Other

## 2019-06-23 LAB — BASIC METABOLIC PANEL WITH GFR
Anion gap: 8 (ref 5–15)
BUN: 11 mg/dL (ref 6–20)
CO2: 23 mmol/L (ref 22–32)
Calcium: 8.7 mg/dL — ABNORMAL LOW (ref 8.9–10.3)
Chloride: 108 mmol/L (ref 98–111)
Creatinine, Ser: 0.79 mg/dL (ref 0.44–1.00)
GFR calc Af Amer: >60 mL/min
GFR calc non Af Amer: >60 mL/min
Glucose, Bld: 87 mg/dL (ref 70–99)
Potassium: 3.6 mmol/L (ref 3.5–5.1)
Sodium: 139 mmol/L (ref 135–145)

## 2019-06-23 LAB — CBC
HCT: 32.9 % — ABNORMAL LOW (ref 36.0–46.0)
Hemoglobin: 10.7 g/dL — ABNORMAL LOW (ref 12.0–15.0)
MCH: 33.2 pg (ref 26.0–34.0)
MCHC: 32.5 g/dL (ref 30.0–36.0)
MCV: 102.2 fL — ABNORMAL HIGH (ref 80.0–100.0)
Platelets: 122 10*3/uL — ABNORMAL LOW (ref 150–400)
RBC: 3.22 MIL/uL — ABNORMAL LOW (ref 3.87–5.11)
RDW: 13.6 % (ref 11.5–15.5)
WBC: 2 10*3/uL — ABNORMAL LOW (ref 4.0–10.5)
nRBC: 0 % (ref 0.0–0.2)

## 2019-06-23 LAB — APTT
aPTT: 118 seconds — ABNORMAL HIGH (ref 24–36)
aPTT: 81 s — ABNORMAL HIGH (ref 24–36)
aPTT: 65 seconds — ABNORMAL HIGH (ref 24–36)

## 2019-06-23 LAB — HEPARIN LEVEL (UNFRACTIONATED): Heparin Unfractionated: 0.75 IU/mL — ABNORMAL HIGH (ref 0.30–0.70)

## 2019-06-23 LAB — MAGNESIUM: Magnesium: 1.8 mg/dL (ref 1.7–2.4)

## 2019-06-23 MED ORDER — MAGNESIUM SULFATE 2 GM/50ML IV SOLN
2.0000 g | Freq: Once | INTRAVENOUS | Status: AC
  Administered 2019-06-23: 08:00:00 2 g via INTRAVENOUS
  Filled 2019-06-23: qty 50

## 2019-06-23 NOTE — Progress Notes (Addendum)
Subjective:  CC: Joanna Hall is a 53 y.o. female  Hospital stay day 2,   Small bowel obstruction.  HPI: New onset worsening pain and chest pain after another episode of diarrhea and nausea.  These attacks are similar to previous reactions to her recent chemo treatment.  Still having diarrhea.   ROS:  General: Denies weight loss, weight gain, fatigue, fevers, chills, and night sweats. Heart: Denies chest pain, palpitations, racing heart, irregular heartbeat, leg pain or swelling, and decreased activity tolerance. Respiratory: Denies breathing difficulty, shortness of breath, wheezing, cough, and sputum. GI: Denies change in appetite, heartburn, and blood in stool. GU: Denies difficulty urinating, pain with urinating, urgency, frequency, blood in urine.   Objective:   Temp:  [97.5 F (36.4 C)-98.7 F (37.1 C)] 97.8 F (36.6 C) (09/07 0446) Pulse Rate:  [68-79] 68 (09/07 0446) Resp:  [16-20] 18 (09/07 0446) BP: (139-182)/(75-102) 149/87 (09/07 0446) SpO2:  [99 %-100 %] 99 % (09/07 0446)     Height: 5' 8"  (172.7 cm) Weight: 75.4 kg BMI (Calculated): 25.28   Intake/Output this shift:   Intake/Output Summary (Last 24 hours) at 06/23/2019 0547 Last data filed at 06/23/2019 0400 Gross per 24 hour  Intake 1637.31 ml  Output 900 ml  Net 737.31 ml    Constitutional :  alert, cooperative, appears stated age and no distress  Respiratory:  clear to auscultation bilaterally  Cardiovascular:  regular rate and rhythm  Gastrointestinal: Soft, no guarding, still complains of tenderness in all 4 quadrants.  unchanged, not worse.   Skin: Cool and moist.   Psychiatric: Normal affect, non-agitated, not confused       LABS:  CMP Latest Ref Rng & Units 06/21/2019 06/19/2019 06/15/2019  Glucose 70 - 99 mg/dL 115(H) 99 77  BUN 6 - 20 mg/dL 18 13 8   Creatinine 0.44 - 1.00 mg/dL 0.75 0.68 0.72  Sodium 135 - 145 mmol/L 137 134(L) 139  Potassium 3.5 - 5.1 mmol/L 3.7 3.8 4.3  Chloride 98 - 111  mmol/L 103 101 105  CO2 22 - 32 mmol/L 23 26 27   Calcium 8.9 - 10.3 mg/dL 9.5 8.9 8.9  Total Protein 6.5 - 8.1 g/dL 7.5 7.1 -  Total Bilirubin 0.3 - 1.2 mg/dL 0.4 0.5 -  Alkaline Phos 38 - 126 U/L 145(H) 126 -  AST 15 - 41 U/L 40 50(H) -  ALT 0 - 44 U/L 72(H) 69(H) -   CBC Latest Ref Rng & Units 06/22/2019 06/21/2019 06/19/2019  WBC 4.0 - 10.5 K/uL 3.5(L) 7.7 5.3  Hemoglobin 12.0 - 15.0 g/dL 10.3(L) 12.1 11.5(L)  Hematocrit 36.0 - 46.0 % 31.8(L) 36.5 35.4(L)  Platelets 150 - 400 K/uL 124(L) 193 165    RADS:  COMPARISON:  Prior radiograph from 06/22/2019.  FINDINGS: Enteric tube in place with tip in side hole overlying the stomach, beyond the GE junction.  Enteric contrast material seen diffusely throughout the colon to the level of the distal sigmoid. Some contrast is seen within the distal ileum within the right lower quadrant. No significant small bowel dilatation identified. No soft tissue mass or abnormal calcification. Cholecystectomy clips noted. No visible free air.  Visualized lung bases are clear.  Lumbar levoscoliosis with moderate multilevel degenerative spondylolysis noted.  IMPRESSION: 1. Enteric contrast material diffusely throughout the colon to the level of the distal sigmoid, with few scattered nondilated contrast filled loops of distal small bowel within the right lower quadrant. No significant small bowel dilatation identified. 2. Enteric tube in place  with tip and side hole overlying the stomach, beyond the GE junction   Electronically Signed   By: Jeannine Boga M.D.   On: 06/23/2019 00:54 Assessment:   SBFT above reassuring the initial concern noted on CT scan has resolved.  Pt still has complaints of nausea, but with the findings above, symptoms maybe related to her chemotherapy as patient specifically mentioned today.    Will proceed with NG tube clamp trial, and if no significant residual, can d/c NG and resume diet.  Surgery will  sign once patient is tolerating diet.  She can f/u with Dr. Hampton Abbot regarding discussion of possible diverting ostomy for her chronic diarrhea  Factor V leiden- heparin drip in preparation for failed medical management forcing [place] to proceed with surgery.  Can switch back to xarelto when ready for d/c.  Stage IV cervical cancer, Hep C- continue management per primary team

## 2019-06-23 NOTE — Progress Notes (Signed)
ANTICOAGULATION CONSULT NOTE -  Pharmacy Consult for Heparin  Indication: DVT  Allergies  Allergen Reactions  . Ketamine Anxiety and Other (See Comments)    Syncope episode/confusion    Patient Measurements: Height: 5' 8"  (172.7 cm) Weight: 166 lb 3.2 oz (75.4 kg) IBW/kg (Calculated) : 63.9 Heparin Dosing Weight: 75.4 kg   Vital Signs: Temp: 97.9 F (36.6 C) (09/07 1158) Temp Source: Oral (09/07 1158) BP: 157/78 (09/07 1239) Pulse Rate: 73 (09/07 1239)  Labs: Recent Labs    06/21/19 0839  06/21/19 1817 06/22/19 0059 06/22/19 0510 06/23/19 0546  HGB 12.1  --   --   --  10.3* 10.7*  HCT 36.5  --   --   --  31.8* 32.9*  PLT 193  --   --   --  124* 122*  APTT  --    < > 24 96* 98* 118*  LABPROT  --   --  12.8  --   --   --   INR  --   --  1.0  --   --   --   HEPARINUNFRC  --   --  <0.10*  --  0.78* 0.75*  CREATININE 0.75  --   --   --   --  0.79   < > = values in this interval not displayed.    Estimated Creatinine Clearance: 83.9 mL/min (by C-G formula based on SCr of 0.79 mg/dL).   Medical History: Past Medical History:  Diagnosis Date  . Abdominal pain 06/10/2018  . Abnormal cervical Papanicolaou smear 09/18/2017  . Anxiety   . Aortic atherosclerosis ([place])   . Arthritis    neck and knees  . 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 Blood transfusion without reported diagnosis   . Cervical cancer ([place]) 09/2016   mets lung  . Chronic anal fissure   . Chronic diarrhea   . Dyspnea   . Erosive gastropathy 09/18/2017  . Factor V Leiden mutation ([place])   . Fecal incontinence   . Genital warts   . GERD (gastroesophageal reflux disease)   . GI bleed 10/08/2018  . Heart murmur   . Hematochezia   . Hemorrhoids   . 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 Hepatitis, chronic ([place]) 05/05/2017  . History of cancer chemotherapy    completed 06/2017  . History of Clostridium difficile infection    while undergoing chemo.   Negative test 10/2017  . Ileocolic anastomotic leak   . 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 Intestinal infection due to Clostridium difficile 09/18/2017  . Macrocytic anemia with vitamin B12 deficiency   . Multiple gastric ulcers   . Nausea vomiting and diarrhea   . Pancolitis ([place]) 07/27/2018  . Perianal condylomata   . Pneumonia    History of  . Pulmonary nodules   . Rectal bleeding   . Small bowel obstruction ([place]) 08/2017  . Stiff neck    limited right turn  . Vitamin D deficiency     Medications:  Medications Prior to Admission  Medication Sig Dispense Refill Last Dose  . amitriptyline (ELAVIL) 75 MG tablet Take 1 tablet (75 mg total) by mouth at bedtime. 90 tablet 1 06/20/2019 at Unknown time  . Calcium Carb-Cholecalciferol (CALCIUM 500 +D) 500-400 MG-UNIT TABS Take 2 tablets by mouth daily.   06/20/2019 at Unknown time  . diphenoxylate-atropine (  LOMOTIL) 2.5-0.025 MG tablet TAKE (2) TABLETS FOUR TIMES DAILY. 240 tablet 0 06/20/2019 at Unknown time  . famotidine (PEPCID) 20 MG tablet Take 1 tablet (20 mg total) by mouth 2 (two) times daily for 5 days. 10 tablet 0 06/20/2019 at Unknown time  . feeding supplement, ENSURE ENLIVE, (ENSURE ENLIVE) LIQD Take 237 mLs by mouth 3 (three) times daily between meals. 237 mL 12 unknown at unknown  . hydrocortisone (CORTEF) 20 MG tablet Take 1 tablet (20 mg total) by mouth daily. 30 tablet 0 06/20/2019 at Unknown time  . hydrOXYzine (ATARAX/VISTARIL) 10 MG tablet Take 1 tablet (10 mg total) by mouth 3 (three) times daily as needed. 60 tablet 0 prn at prn  . hyoscyamine (LEVBID) 0.375 MG 12 hr tablet Take 1 tablet (0.375 mg total)by mouth 2 (two) times daily. 60 tablet 0 06/20/2019 at Unknown time  . Multiple Vitamins-Minerals (MULTIVITAMIN WITH MINERALS) tablet Take 1 tablet by mouth daily. 30 tablet 0 unknown at unknown  . ondansetron (ZOFRAN ODT) 4 MG disintegrating tablet Take 1 tablet (4 mg total) by mouth every 8 (eight) hours as  needed for nausea or vomiting. 30 tablet 2 prn at prn  . oxyCODONE (OXYCONTIN) 15 mg 12 hr tablet Take 1 tablet (15 mg total) by mouth every 12 (twelve) hours. 60 tablet 0 06/20/2019 at Unknown time  . pantoprazole (PROTONIX) 40 MG tablet Take 1 tablet (40 mg total) by mouth daily. 90 tablet 1 06/20/2019 at Unknown time  . potassium chloride SA (K-DUR) 20 MEQ tablet Take 1 tablet (20 mEq total) by mouth daily as needed (if having nausea/vomiting- to supplements for lost potassium). 30 tablet 0 prn at prn  . promethazine (PHENERGAN) 25 MG tablet Take 1 tablet (25 mg total) by mouth every 8 (eight) hours as needed for nausea or vomiting. 30 tablet 3 prn at prn  . rivaroxaban (XARELTO) 20 MG TABS tablet Take 1 tablet (20 mg total) by mouth daily with supper. (Patient taking differently: Take 20 mg by mouth daily with supper. Takes in the morning) 30 tablet 6 06/20/2019 at Unknown time  . VENTOLIN HFA 108 (90 Base) MCG/ACT inhaler Inhale 1-2 puffs into the lungs every 4 (four) hours as needed for shortness of breath. 1 Inhaler 1 prn at prn  . Oxycodone HCl 10 MG TABS Take 1 tablet (10 mg total) by mouth every 6 (six) hours as needed (for breakthrough pain). 120 tablet 0 prn at prn    Assessment: Pharmacy consulted to dose heparin in this 53 year old female admitted with DVT.  Pt was on Xarelto 20 mg PO daily PTA, last dose was on 9/4 in AM.  CrCl = 83.9 ml/min  9/6 @ 0059 aPTT 96, therapeutic x 1 9/6 @ 0510 aPTT 98, therapeutic x 2.  HL does not correlate w/ aPTT.  H/H and PLTs are worse 9/7 @ 0546 aPTT 118, HL 0.75 - supratherapeutic 9/7 @ 1336 aPTT 81  - therapeutic x 1   Goal of Therapy:  Heparin level 0.3-0.7 units/ml aPTT 66 - 102  seconds Monitor platelets by anticoagulation protocol: Yes   Plan:  Will conitnue heparin drip @ 1100 units/hr.  Will use aPTT to guide dosing until aPTT and HL are both therapeutic and correlate.  Check aPTT in 6 hours per protocol   Will check HL on 9/8 with AM  labs.  Monitor for s/sx of bleeding complications  Lu Duffel, PharmD, BCPS Clinical Pharmacist 06/23/2019 1:45 PM

## 2019-06-23 NOTE — Progress Notes (Signed)
ANTICOAGULATION CONSULT NOTE -  Pharmacy Consult for Heparin  Indication: DVT  Allergies  Allergen Reactions  . Ketamine Anxiety and Other (See Comments)    Syncope episode/confusion    Patient Measurements: Height: 5' 8"  (172.7 cm) Weight: 166 lb 3.2 oz (75.4 kg) IBW/kg (Calculated) : 63.9 Heparin Dosing Weight: 75.4 kg   Vital Signs: Temp: 97.9 F (36.6 C) (09/07 1158) Temp Source: Oral (09/07 1158) BP: 157/78 (09/07 1239) Pulse Rate: 73 (09/07 1239)  Labs: Recent Labs    06/21/19 0839 06/21/19 1817  06/22/19 0510 06/23/19 0546 06/23/19 1253 06/23/19 2009  HGB 12.1  --   --  10.3* 10.7*  --   --   HCT 36.5  --   --  31.8* 32.9*  --   --   PLT 193  --   --  124* 122*  --   --   APTT  --  24   < > 98* 118* 81* 65*  LABPROT  --  12.8  --   --   --   --   --   INR  --  1.0  --   --   --   --   --   HEPARINUNFRC  --  <0.10*  --  0.78* 0.75*  --   --   CREATININE 0.75  --   --   --  0.79  --   --    < > = values in this interval not displayed.    Estimated Creatinine Clearance: 83.9 mL/min (by C-G formula based on SCr of 0.79 mg/dL).   Medical History: Past Medical History:  Diagnosis Date  . Abdominal pain 06/10/2018  . Abnormal cervical Papanicolaou smear 09/18/2017  . Anxiety   . Aortic atherosclerosis ([place])   . Arthritis    neck and knees  . 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 Blood transfusion without reported diagnosis   . Cervical cancer ([place]) 09/2016   mets lung  . Chronic anal fissure   . Chronic diarrhea   . Dyspnea   . Erosive gastropathy 09/18/2017  . Factor V Leiden mutation ([place])   . Fecal incontinence   . Genital warts   . GERD (gastroesophageal reflux disease)   . GI bleed 10/08/2018  . Heart murmur   . Hematochezia   . Hemorrhoids   . 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 Hepatitis, chronic ([place]) 05/05/2017  . History of cancer chemotherapy    completed 06/2017  . History of  Clostridium difficile infection    while undergoing chemo.  Negative test 10/2017  . Ileocolic anastomotic leak   . 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 Intestinal infection due to Clostridium difficile 09/18/2017  . Macrocytic anemia with vitamin B12 deficiency   . Multiple gastric ulcers   . Nausea vomiting and diarrhea   . Pancolitis ([place]) 07/27/2018  . Perianal condylomata   . Pneumonia    History of  . Pulmonary nodules   . Rectal bleeding   . Small bowel obstruction ([place]) 08/2017  . Stiff neck    limited right turn  . Vitamin D deficiency     Medications:  Medications Prior to Admission  Medication Sig Dispense Refill Last Dose  . amitriptyline (ELAVIL) 75 MG tablet Take 1 tablet (75 mg total) by mouth at bedtime. 90 tablet 1 06/20/2019 at Unknown time  .  Calcium Carb-Cholecalciferol (CALCIUM 500 +D) 500-400 MG-UNIT TABS Take 2 tablets by mouth daily.   06/20/2019 at Unknown time  . diphenoxylate-atropine (LOMOTIL) 2.5-0.025 MG tablet TAKE (2) TABLETS FOUR TIMES DAILY. 240 tablet 0 06/20/2019 at Unknown time  . famotidine (PEPCID) 20 MG tablet Take 1 tablet (20 mg total) by mouth 2 (two) times daily for 5 days. 10 tablet 0 06/20/2019 at Unknown time  . feeding supplement, ENSURE ENLIVE, (ENSURE ENLIVE) LIQD Take 237 mLs by mouth 3 (three) times daily between meals. 237 mL 12 unknown at unknown  . hydrocortisone (CORTEF) 20 MG tablet Take 1 tablet (20 mg total) by mouth daily. 30 tablet 0 06/20/2019 at Unknown time  . hydrOXYzine (ATARAX/VISTARIL) 10 MG tablet Take 1 tablet (10 mg total) by mouth 3 (three) times daily as needed. 60 tablet 0 prn at prn  . hyoscyamine (LEVBID) 0.375 MG 12 hr tablet Take 1 tablet (0.375 mg total)by mouth 2 (two) times daily. 60 tablet 0 06/20/2019 at Unknown time  . Multiple Vitamins-Minerals (MULTIVITAMIN WITH MINERALS) tablet Take 1 tablet by mouth daily. 30 tablet 0 unknown at unknown  . ondansetron (ZOFRAN ODT) 4 MG disintegrating tablet  Take 1 tablet (4 mg total) by mouth every 8 (eight) hours as needed for nausea or vomiting. 30 tablet 2 prn at prn  . oxyCODONE (OXYCONTIN) 15 mg 12 hr tablet Take 1 tablet (15 mg total) by mouth every 12 (twelve) hours. 60 tablet 0 06/20/2019 at Unknown time  . pantoprazole (PROTONIX) 40 MG tablet Take 1 tablet (40 mg total) by mouth daily. 90 tablet 1 06/20/2019 at Unknown time  . potassium chloride SA (K-DUR) 20 MEQ tablet Take 1 tablet (20 mEq total) by mouth daily as needed (if having nausea/vomiting- to supplements for lost potassium). 30 tablet 0 prn at prn  . promethazine (PHENERGAN) 25 MG tablet Take 1 tablet (25 mg total) by mouth every 8 (eight) hours as needed for nausea or vomiting. 30 tablet 3 prn at prn  . rivaroxaban (XARELTO) 20 MG TABS tablet Take 1 tablet (20 mg total) by mouth daily with supper. (Patient taking differently: Take 20 mg by mouth daily with supper. Takes in the morning) 30 tablet 6 06/20/2019 at Unknown time  . VENTOLIN HFA 108 (90 Base) MCG/ACT inhaler Inhale 1-2 puffs into the lungs every 4 (four) hours as needed for shortness of breath. 1 Inhaler 1 prn at prn  . Oxycodone HCl 10 MG TABS Take 1 tablet (10 mg total) by mouth every 6 (six) hours as needed (for breakthrough pain). 120 tablet 0 prn at prn    Assessment: Pharmacy consulted to dose heparin in this 53 year old female admitted with DVT.  Pt was on Xarelto 20 mg PO daily PTA, last dose was on 9/4 in AM.  CrCl = 83.9 ml/min  9/6 @ 0059 aPTT 96, therapeutic x 1 9/6 @ 0510 aPTT 98, therapeutic x 2.  HL does not correlate w/ aPTT.  H/H and PLTs are worse 9/7 @ 0546 aPTT 118, HL 0.75 - supratherapeutic 9/7 @ 1336 aPTT 81  - therapeutic x 1   Goal of Therapy:  Heparin level 0.3-0.7 units/ml aPTT 66 - 102  seconds Monitor platelets by anticoagulation protocol: Yes   Plan:  Will conitnue heparin drip @ 1100 units/hr.  Will use aPTT to guide dosing until aPTT and HL are both therapeutic and correlate.  Check  aPTT in 6 hours per protocol   Will check HL on 9/8 with AM  labs.  Monitor for s/sx of bleeding complications  9/7: aPTT @ 2000 = 65 Will increase heparin gtt rate to 1150 units/hr and recheck aPTT 6 hrs after rate change. Will check HL on 9/8 with AM labs.   Orene Desanctis, PharmD Clinical Pharmacist 06/23/2019 9:13 PM

## 2019-06-23 NOTE — Progress Notes (Signed)
ANTICOAGULATION CONSULT NOTE -  Pharmacy Consult for Heparin  Indication: DVT  Allergies  Allergen Reactions  . Ketamine Anxiety and Other (See Comments)    Syncope episode/confusion    Patient Measurements: Height: 5' 8"  (172.7 cm) Weight: 166 lb 3.2 oz (75.4 kg) IBW/kg (Calculated) : 63.9 Heparin Dosing Weight: 75.4 kg   Vital Signs: Temp: 97.8 F (36.6 C) (09/07 0446) Temp Source: Oral (09/07 0446) BP: 149/87 (09/07 0446) Pulse Rate: 68 (09/07 0446)  Labs: Recent Labs    06/21/19 0839  06/21/19 1817 06/22/19 0059 06/22/19 0510 06/23/19 0546  HGB 12.1  --   --   --  10.3* 10.7*  HCT 36.5  --   --   --  31.8* 32.9*  PLT 193  --   --   --  124* 122*  APTT  --    < > 24 96* 98* 118*  LABPROT  --   --  12.8  --   --   --   INR  --   --  1.0  --   --   --   HEPARINUNFRC  --   --  <0.10*  --  0.78* 0.75*  CREATININE 0.75  --   --   --   --   --    < > = values in this interval not displayed.    Estimated Creatinine Clearance: 83.9 mL/min (by C-G formula based on SCr of 0.75 mg/dL).   Medical History: Past Medical History:  Diagnosis Date  . Abdominal pain 06/10/2018  . Abnormal cervical Papanicolaou smear 09/18/2017  . Anxiety   . Aortic atherosclerosis ([place])   . Arthritis    neck and knees  . 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 Blood transfusion without reported diagnosis   . Cervical cancer ([place]) 09/2016   mets lung  . Chronic anal fissure   . Chronic diarrhea   . Dyspnea   . Erosive gastropathy 09/18/2017  . Factor V Leiden mutation ([place])   . Fecal incontinence   . Genital warts   . GERD (gastroesophageal reflux disease)   . GI bleed 10/08/2018  . Heart murmur   . Hematochezia   . Hemorrhoids   . 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 Hepatitis, chronic ([place]) 05/05/2017  . History of cancer chemotherapy    completed 06/2017  . History of Clostridium difficile infection    while undergoing chemo.   Negative test 10/2017  . Ileocolic anastomotic leak   . 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 Intestinal infection due to Clostridium difficile 09/18/2017  . Macrocytic anemia with vitamin B12 deficiency   . Multiple gastric ulcers   . Nausea vomiting and diarrhea   . Pancolitis ([place]) 07/27/2018  . Perianal condylomata   . Pneumonia    History of  . Pulmonary nodules   . Rectal bleeding   . Small bowel obstruction ([place]) 08/2017  . Stiff neck    limited right turn  . Vitamin D deficiency     Medications:  Medications Prior to Admission  Medication Sig Dispense Refill Last Dose  . amitriptyline (ELAVIL) 75 MG tablet Take 1 tablet (75 mg total) by mouth at bedtime. 90 tablet 1 06/20/2019 at Unknown time  . Calcium Carb-Cholecalciferol (CALCIUM 500 +D) 500-400 MG-UNIT TABS Take 2 tablets by mouth daily.   06/20/2019 at Unknown time  .  diphenoxylate-atropine (LOMOTIL) 2.5-0.025 MG tablet TAKE (2) TABLETS FOUR TIMES DAILY. 240 tablet 0 06/20/2019 at Unknown time  . famotidine (PEPCID) 20 MG tablet Take 1 tablet (20 mg total) by mouth 2 (two) times daily for 5 days. 10 tablet 0 06/20/2019 at Unknown time  . feeding supplement, ENSURE ENLIVE, (ENSURE ENLIVE) LIQD Take 237 mLs by mouth 3 (three) times daily between meals. 237 mL 12 unknown at unknown  . hydrocortisone (CORTEF) 20 MG tablet Take 1 tablet (20 mg total) by mouth daily. 30 tablet 0 06/20/2019 at Unknown time  . hydrOXYzine (ATARAX/VISTARIL) 10 MG tablet Take 1 tablet (10 mg total) by mouth 3 (three) times daily as needed. 60 tablet 0 prn at prn  . hyoscyamine (LEVBID) 0.375 MG 12 hr tablet Take 1 tablet (0.375 mg total)by mouth 2 (two) times daily. 60 tablet 0 06/20/2019 at Unknown time  . Multiple Vitamins-Minerals (MULTIVITAMIN WITH MINERALS) tablet Take 1 tablet by mouth daily. 30 tablet 0 unknown at unknown  . ondansetron (ZOFRAN ODT) 4 MG disintegrating tablet Take 1 tablet (4 mg total) by mouth every 8 (eight) hours as  needed for nausea or vomiting. 30 tablet 2 prn at prn  . oxyCODONE (OXYCONTIN) 15 mg 12 hr tablet Take 1 tablet (15 mg total) by mouth every 12 (twelve) hours. 60 tablet 0 06/20/2019 at Unknown time  . pantoprazole (PROTONIX) 40 MG tablet Take 1 tablet (40 mg total) by mouth daily. 90 tablet 1 06/20/2019 at Unknown time  . potassium chloride SA (K-DUR) 20 MEQ tablet Take 1 tablet (20 mEq total) by mouth daily as needed (if having nausea/vomiting- to supplements for lost potassium). 30 tablet 0 prn at prn  . promethazine (PHENERGAN) 25 MG tablet Take 1 tablet (25 mg total) by mouth every 8 (eight) hours as needed for nausea or vomiting. 30 tablet 3 prn at prn  . rivaroxaban (XARELTO) 20 MG TABS tablet Take 1 tablet (20 mg total) by mouth daily with supper. (Patient taking differently: Take 20 mg by mouth daily with supper. Takes in the morning) 30 tablet 6 06/20/2019 at Unknown time  . VENTOLIN HFA 108 (90 Base) MCG/ACT inhaler Inhale 1-2 puffs into the lungs every 4 (four) hours as needed for shortness of breath. 1 Inhaler 1 prn at prn  . Oxycodone HCl 10 MG TABS Take 1 tablet (10 mg total) by mouth every 6 (six) hours as needed (for breakthrough pain). 120 tablet 0 prn at prn    Assessment: Pharmacy consulted to dose heparin in this 53 year old female admitted with DVT.  Pt was on Xarelto 20 mg PO daily PTA, last dose was on 9/4 in AM.  CrCl = 83.9 ml/min  9/6 @ 0059 aPTT 96, therapeutic x 1 9/6 @ 0510 aPTT 98, therapeutic x 2.  HL does not correlate w/ aPTT.  H/H and PLTs are worse 9/7 @ 0546 aPTT 118, HL 0.75 - supratherapeutic  Goal of Therapy:  Heparin level 0.3-0.7 units/ml aPTT 66 - 102  seconds Monitor platelets by anticoagulation protocol: Yes   Plan:  Will reduce heparin drip to 1100 units/hr. Will use aPTT to guide dosing until aPTT and HL are both therapeutic and correlate. Check aPTT 6 hours after rate change.   Will check HL on 9/8 with AM labs.  Monitor for s/sx of bleeding  complications  Hart Robinsons A 06/23/2019,6:32 AM

## 2019-06-23 NOTE — Progress Notes (Signed)
Patient ID: Joanna Hall, female   DOB: [birth year], 53 y.o.   MRN: [id number]  [facility] PROGRESS NOTE  Joanna Hall:[id number] DOB: 12-20-1965 DOA: 06/21/2019 PCP: Arnetha Courser, MD  HPI/Subjective: Patient feeling much better than yesterday.  Still with some abdominal pain.  No nausea or vomiting.  No bowel movement since last night.  Passing gas.  Objective: Vitals:   06/23/19 1158 06/23/19 1239  BP: (!) 163/89 (!) 157/78  Pulse: 71 73  Resp:    Temp: 97.9 F (36.6 C)   SpO2: 100%     Intake/Output Summary (Last 24 hours) at 06/23/2019 1304 Last data filed at 06/23/2019 0600 Gross per 24 hour  Intake 1637.31 ml  Output 500 ml  Net 1137.31 ml   Filed Weights   06/21/19 0819  Weight: 75.4 kg    ROS: Review of Systems  Constitutional: Negative for chills and fever.  Eyes: Negative for blurred vision.  Respiratory: Negative for cough and shortness of breath.   Cardiovascular: Negative for chest pain.  Gastrointestinal: Positive for abdominal pain. Negative for constipation, diarrhea, nausea and vomiting.  Genitourinary: Negative for dysuria.  Musculoskeletal: Negative for joint pain.  Neurological: Negative for dizziness and headaches.   Exam: Physical Exam  Constitutional: She is oriented to person, place, and time.  HENT:  Nose: No mucosal edema.  Mouth/Throat: No oropharyngeal exudate or posterior oropharyngeal edema.  Eyes: Pupils are equal, round, and reactive to light. Conjunctivae, EOM and lids are normal.  Neck: No JVD present. Carotid bruit is not present. No edema present. No thyroid mass and no thyromegaly present.  Cardiovascular: S1 normal and S2 normal. Exam reveals no gallop.  No murmur heard. Pulses:      Dorsalis pedis pulses are 2+ on the right side and 2+ on the left side.  Respiratory: No respiratory distress. She has decreased breath sounds in the right lower field and the left lower field. She has no wheezes. She has no  rhonchi. She has no rales.  GI: Soft. Bowel sounds are normal. There is abdominal tenderness.  Musculoskeletal:     Right ankle: She exhibits no swelling.     Left ankle: She exhibits no swelling.  Lymphadenopathy:    She has no cervical adenopathy.  Neurological: She is alert and oriented to person, place, and time. No cranial nerve deficit.  Skin: Skin is warm. No rash noted. Nails show no clubbing.  Psychiatric: She has a normal mood and affect.      Data Reviewed: Basic Metabolic Panel: Recent Labs  Lab 06/19/19 0805 06/21/19 0839 06/23/19 0546  NA 134* 137 139  K 3.8 3.7 3.6  CL 101 103 108  CO2 26 23 23   GLUCOSE 99 115* 87  BUN 13 18 11   CREATININE 0.68 0.75 0.79  CALCIUM 8.9 9.5 8.7*  MG 1.4* 1.5* 1.8  PHOS  --  3.6  --    Liver Function Tests: Recent Labs  Lab 06/19/19 0805 06/21/19 0839  AST 50* 40  ALT 69* 72*  ALKPHOS 126 145*  BILITOT 0.5 0.4  PROT 7.1 7.5  ALBUMIN 3.5 3.8   Recent Labs  Lab 06/21/19 0839  LIPASE 25   No results for input(s): AMMONIA in the last 168 hours. CBC: Recent Labs  Lab 06/19/19 0805 06/21/19 0839 06/22/19 0510 06/23/19 0546  WBC 5.3 7.7 3.5* 2.0*  NEUTROABS 3.6 5.9  --   --   HGB 11.5* 12.1 10.3* 10.7*  HCT 35.4* 36.5 31.8*  32.9*  MCV 100.9* 99.5 101.6* 102.2*  PLT 165 193 124* 122*   Cardiac Enzymes: No results for input(s): CKTOTAL, CKMB, CKMBINDEX, TROPONINI in the last 168 hours. BNP (last 3 results) Recent Labs    05/23/19 1219  BNP 23.0    ProBNP (last 3 results) No results for input(s): PROBNP in the last 8760 hours.  CBG: No results for input(s): GLUCAP in the last 168 hours.  Recent Results (from the past 240 hour(s))  SARS Coronavirus 2 [facility] order, Performed in [facility] hospital lab) Nasopharyngeal Nasopharyngeal Swab     Status: None   Collection Time: 06/14/19  1:33 AM   Specimen: Nasopharyngeal Swab  Result Value Ref Range Status   SARS Coronavirus 2 NEGATIVE NEGATIVE Final     Comment: (NOTE) If result is NEGATIVE SARS-CoV-2 target nucleic acids are NOT DETECTED. The SARS-CoV-2 RNA is generally detectable in upper and lower  respiratory specimens during the acute phase of infection. The lowest  concentration of SARS-CoV-2 viral copies this assay can detect is 250  copies / mL. A negative result does not preclude SARS-CoV-2 infection  and should not be used as the sole basis for treatment or other  patient management decisions.  A negative result may occur with  improper specimen collection / handling, submission of specimen other  than nasopharyngeal swab, presence of viral mutation(s) within the  areas targeted by this assay, and inadequate number of viral copies  (<250 copies / mL). A negative result must be combined with clinical  observations, patient history, and epidemiological information. If result is POSITIVE SARS-CoV-2 target nucleic acids are DETECTED. The SARS-CoV-2 RNA is generally detectable in upper and lower  respiratory specimens dur ing the acute phase of infection.  Positive  results are indicative of active infection with SARS-CoV-2.  Clinical  correlation with patient history and other diagnostic information is  necessary to determine patient infection status.  Positive results do  not rule out bacterial infection or co-infection with other viruses. If result is PRESUMPTIVE POSTIVE SARS-CoV-2 nucleic acids MAY BE PRESENT.   A presumptive positive result was obtained on the submitted specimen  and confirmed on repeat testing.  While 2019 novel coronavirus  (SARS-CoV-2) nucleic acids may be present in the submitted sample  additional confirmatory testing may be necessary for epidemiological  and / or clinical management purposes  to differentiate between  SARS-CoV-2 and other Sarbecovirus currently known to infect humans.  If clinically indicated additional testing with an alternate test  methodology [phone]) is advised. The  SARS-CoV-2 RNA is generally  detectable in upper and lower respiratory sp ecimens during the acute  phase of infection. The expected result is Negative. Fact Sheet for Patients:  StrictlyIdeas.no Fact Sheet for Healthcare Providers: BankingDealers.co.za This test is not yet approved or cleared by the [place] FDA and has been authorized for detection and/or diagnosis of SARS-CoV-2 by FDA under an Emergency Use Authorization (EUA).  This EUA will remain in effect (meaning this test can be used) for the duration of the COVID-19 declaration under Section 564(b)(1) of the Act, 21 U.S.C. section 360bbb-3(b)(1), unless the authorization is terminated or revoked sooner. Performed at [facility], [address] [place], [place] [address]   C difficile quick scan w PCR reflex     Status: None   Collection Time: 06/14/19  3:00 PM   Specimen: STOOL  Result Value Ref Range Status   C Diff antigen NEGATIVE NEGATIVE Final   C Diff toxin NEGATIVE  NEGATIVE Final   C Diff interpretation No C. difficile detected.  Final    Comment: Performed at [facility], [address] [place], [place] [address]  Gastrointestinal Panel by PCR , Stool     Status: None   Collection Time: 06/14/19  3:00 PM   Specimen: Stool  Result Value Ref Range Status   Campylobacter species NOT DETECTED NOT DETECTED Final   Plesimonas shigelloides NOT DETECTED NOT DETECTED Final   Salmonella species NOT DETECTED NOT DETECTED Final   Yersinia enterocolitica NOT DETECTED NOT DETECTED Final   Vibrio species NOT DETECTED NOT DETECTED Final   Vibrio cholerae NOT DETECTED NOT DETECTED Final   Enteroaggregative E coli (EAEC) NOT DETECTED NOT DETECTED Final   Enteropathogenic E coli (EPEC) NOT DETECTED NOT DETECTED Final   Enterotoxigenic E coli (ETEC) NOT DETECTED NOT DETECTED Final   Shiga like toxin producing E coli (STEC) NOT DETECTED NOT DETECTED Final    Shigella/Enteroinvasive E coli (EIEC) NOT DETECTED NOT DETECTED Final   Cryptosporidium NOT DETECTED NOT DETECTED Final   Cyclospora cayetanensis NOT DETECTED NOT DETECTED Final   Entamoeba histolytica NOT DETECTED NOT DETECTED Final   Giardia lamblia NOT DETECTED NOT DETECTED Final   Adenovirus F40/41 NOT DETECTED NOT DETECTED Final   Astrovirus NOT DETECTED NOT DETECTED Final   Norovirus GI/GII NOT DETECTED NOT DETECTED Final   Rotavirus A NOT DETECTED NOT DETECTED Final   Sapovirus (I, II, IV, and V) NOT DETECTED NOT DETECTED Final    Comment: Performed at [facility], [address] [place], [place] [address]  SARS Coronavirus 2 [facility] order, Performed in [facility] hospital lab) Nasopharyngeal Nasopharyngeal Swab     Status: None   Collection Time: 06/21/19  8:42 AM   Specimen: Nasopharyngeal Swab  Result Value Ref Range Status   SARS Coronavirus 2 NEGATIVE NEGATIVE Final    Comment: (NOTE) If result is NEGATIVE SARS-CoV-2 target nucleic acids are NOT DETECTED. The SARS-CoV-2 RNA is generally detectable in upper and lower  respiratory specimens during the acute phase of infection. The lowest  concentration of SARS-CoV-2 viral copies this assay can detect is 250  copies / mL. A negative result does not preclude SARS-CoV-2 infection  and should not be used as the sole basis for treatment or other  patient management decisions.  A negative result may occur with  improper specimen collection / handling, submission of specimen other  than nasopharyngeal swab, presence of viral mutation(s) within the  areas targeted by this assay, and inadequate number of viral copies  (<250 copies / mL). A negative result must be combined with clinical  observations, patient history, and epidemiological information. If result is POSITIVE SARS-CoV-2 target nucleic acids are DETECTED. The SARS-CoV-2 RNA is generally detectable in upper and lower  respiratory specimens dur ing the  acute phase of infection.  Positive  results are indicative of active infection with SARS-CoV-2.  Clinical  correlation with patient history and other diagnostic information is  necessary to determine patient infection status.  Positive results do  not rule out bacterial infection or co-infection with other viruses. If result is PRESUMPTIVE POSTIVE SARS-CoV-2 nucleic acids MAY BE PRESENT.   A presumptive positive result was obtained on the submitted specimen  and confirmed on repeat testing.  While 2019 novel coronavirus  (SARS-CoV-2) nucleic acids may be present in the submitted sample  additional confirmatory testing may be necessary for epidemiological  and / or clinical management purposes  to differentiate between  SARS-CoV-2 and other Sarbecovirus currently known to infect humans.  If clinically indicated additional testing with an alternate test  methodology [phone]) is advised. The SARS-CoV-2 RNA is generally  detectable in upper and lower respiratory sp ecimens during the acute  phase of infection. The expected result is Negative. Fact Sheet for Patients:  StrictlyIdeas.no Fact Sheet for Healthcare Providers: BankingDealers.co.za This test is not yet approved or cleared by the [place] FDA and has been authorized for detection and/or diagnosis of SARS-CoV-2 by FDA under an Emergency Use Authorization (EUA).  This EUA will remain in effect (meaning this test can be used) for the duration of the COVID-19 declaration under Section 564(b)(1) of the Act, 21 U.S.C. section 360bbb-3(b)(1), unless the authorization is terminated or revoked sooner. Performed at [facility], [address] [place], [place] [address]   C difficile quick scan w PCR reflex     Status: None   Collection Time: 06/21/19  4:44 PM   Specimen: STOOL  Result Value Ref Range Status   C Diff antigen NEGATIVE NEGATIVE Final   C Diff toxin NEGATIVE  NEGATIVE Final   C Diff interpretation No C. difficile detected.  Final    Comment: Performed at [facility], [address] [place], [place] [address]     Studies: Dg Abd 1 View  Result Date: 06/22/2019 CLINICAL DATA:  NG tube placement. EXAM: ABDOMEN - 1 VIEW COMPARISON:  06/21/2019 FINDINGS: Nasogastric tube tip overlies the level of the mid stomach. Bowel gas pattern is nonobstructed. Numerous pulmonary nodules are again identified at the lung bases. IMPRESSION: Nasogastric tube tip to the level of the mid stomach. Electronically Signed   By: Nolon Nations M.D.   On: 06/22/2019 14:52   Dg Abd Portable 1v-small Bowel Obstruction Protocol-initial, 8 Hr Delay  Result Date: 06/23/2019 CLINICAL DATA:  Initial evaluation for SBO, 8 hours status post contrast administration. EXAM: PORTABLE ABDOMEN - 1 VIEW COMPARISON:  Prior radiograph from 06/22/2019. FINDINGS: Enteric tube in place with tip in side hole overlying the stomach, beyond the GE junction. Enteric contrast material seen diffusely throughout the colon to the level of the distal sigmoid. Some contrast is seen within the distal ileum within the right lower quadrant. No significant small bowel dilatation identified. No soft tissue mass or abnormal calcification. Cholecystectomy clips noted. No visible free air. Visualized lung bases are clear. Lumbar levoscoliosis with moderate multilevel degenerative spondylolysis noted. IMPRESSION: 1. Enteric contrast material diffusely throughout the colon to the level of the distal sigmoid, with few scattered nondilated contrast filled loops of distal small bowel within the right lower quadrant. No significant small bowel dilatation identified. 2. Enteric tube in place with tip and side hole overlying the stomach, beyond the GE junction Electronically Signed   By: Jeannine Boga M.D.   On: 06/23/2019 00:54   Dg Abd Portable 1 View  Result Date: 06/21/2019 CLINICAL DATA:  NG tube placement.  History of cervical and lung cancer. EXAM: PORTABLE ABDOMEN - 1 VIEW COMPARISON:  CT abdomen pelvis-06/21/2019 FINDINGS: Enteric tube tip and side port project over the mid body of the stomach. No pneumoperitoneum, pneumatosis or portal venous gas. Small amount of excreted contrast is seen within the bilateral renal collecting systems as a sequela of recent intravenous contrasted examination. Limited visualization of lower thorax demonstrates scattered bilateral pulmonary nodules compatible with known metastatic disease. Port a catheter tip projects over the superior cavoatrial junction. Post cholecystectomy. Mild-to-moderate scoliotic curvature of the thoracolumbar spine. IMPRESSION: Enteric  tube tip and side port project over the mid body of the stomach. Electronically Signed   By: Sandi Mariscal M.D.   On: 06/21/2019 13:30    Scheduled Meds: . diatrizoate meglumine-sodium  90 mL Per Tube Once  . hydrocortisone  20 mg Oral Daily  . hyoscyamine  0.375 mg Oral Q12H  . pantoprazole (PROTONIX) IV  40 mg Intravenous Q24H   Continuous Infusions: . 0.9 % NaCl with KCl 20 mEq / L 75 mL/hr at 06/23/19 0400  . heparin 1,100 Units/hr (06/23/19 0651)    Assessment/Plan:  1. Small bowel obstruction.  Patient has numerous episodes of this in the past.  Has had prior abdominal surgeries.  General surgery clamp the NG tube as of this morning and may take it out this afternoon.  Looks like patient has been started on liquid diet by surgery.  Continue IV fluids.  As needed nausea and pain medication. 2. Chronic colitis and diarrhea.  As of this morning no bowel movement since last night. 3. Hypomagnesemia.  Replace magnesium again today 4. Metastatic cervical cancer 5. Chronic hepatitis C 6. 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  Change oral medication to IV heparin drip.  Code Status:     Code Status Orders  (From admission, onward)         Start     Ordered   06/21/19 1306  Full code   Continuous     06/21/19 1305        Code Status History    Date Active Date Inactive Code Status Order ID Comments User Context   06/14/2019 0418 06/15/2019 1902 Full Code [id number]  Sidney Ace Arvella Merles, MD ED   06/07/2019 1536 06/10/2019 2044 Full Code [id number]  Sela Hua, MD Inpatient   05/19/2019 0313 05/22/2019 1545 Full Code [id number]  Mayer Camel, NP Inpatient   11/19/2018 1803 11/26/2018 2238 Full Code [id number]  Nicholes Mango, MD Inpatient   10/17/2018 1910 10/18/2018 2119 Full Code [id number]  Hillary Bow, MD Inpatient   10/08/2018 1443 10/11/2018 1920 Full Code [id number]  Nicholes Mango, MD Inpatient   07/27/2018 1621 08/01/2018 1432 Full Code [id number]  Demetrios Loll, MD Inpatient   06/10/2018 1754 06/12/2018 1614 Full Code [id number]  Dustin Flock, MD Inpatient   04/24/2018 1451 04/27/2018 1700 Full Code [id number]  Lovell Sheehan, MD Inpatient   12/03/2017 1619 12/08/2017 1606 Full Code [id number]  Epifanio Lesches, MD ED   09/12/2017 0024 09/15/2017 1721 Full Code [id number]  [name], MD ED   08/27/2017 0223 09/05/2017 1355 Full Code [id number]  Herbert Pun, MD Inpatient   Advance Care Planning Activity     Family Communication: Patient deferred me calling family. Disposition Plan: Patient will have to tolerate solid food prior to any disposition  Consultants:  General surgery  Oncology  Time spent: 27 minutes  [place]

## 2019-06-24 ENCOUNTER — Inpatient Hospital Stay: Payer: Medicaid Other

## 2019-06-24 LAB — BASIC METABOLIC PANEL
Anion gap: 8 (ref 5–15)
BUN: <5 mg/dL — ABNORMAL LOW (ref 6–20)
CO2: 22 mmol/L (ref 22–32)
Calcium: 9.1 mg/dL (ref 8.9–10.3)
Chloride: 107 mmol/L (ref 98–111)
Creatinine, Ser: 0.65 mg/dL (ref 0.44–1.00)
GFR calc Af Amer: >60 mL/min (ref >60)
GFR calc non Af Amer: >60 mL/min (ref >60)
Glucose, Bld: 107 mg/dL — ABNORMAL HIGH (ref 70–99)
Potassium: 3.7 mmol/L (ref 3.5–5.1)
Sodium: 137 mmol/L (ref 135–145)

## 2019-06-24 LAB — MAGNESIUM: Magnesium: 1.8 mg/dL (ref 1.7–2.4)

## 2019-06-24 LAB — HEPARIN LEVEL (UNFRACTIONATED)
Heparin Unfractionated: 0.57 IU/mL (ref 0.30–0.70)
Heparin Unfractionated: 0.49 IU/mL (ref 0.30–0.70)

## 2019-06-24 LAB — APTT: aPTT: 84 seconds — ABNORMAL HIGH (ref 24–36)

## 2019-06-24 LAB — HEMOGLOBIN AND HEMATOCRIT, BLOOD
HCT: 35.1 % — ABNORMAL LOW (ref 36.0–46.0)
Hemoglobin: 11.4 g/dL — ABNORMAL LOW (ref 12.0–15.0)

## 2019-06-24 MED ORDER — INFLUENZA VAC SPLIT QUAD 0.5 ML IM SUSY
0.5000 mL | PREFILLED_SYRINGE | INTRAMUSCULAR | Status: AC
  Administered 2019-06-25: 16:00:00 0.5 mL via INTRAMUSCULAR
  Filled 2019-06-24 (×2): qty 0.5

## 2019-06-24 MED ORDER — RIVAROXABAN 20 MG PO TABS
20.0000 mg | ORAL_TABLET | Freq: Every day | ORAL | Status: DC
  Administered 2019-06-24 – 2019-06-25 (×2): 20 mg via ORAL
  Filled 2019-06-24 (×2): qty 1

## 2019-06-24 NOTE — Progress Notes (Signed)
[facility] NAME: Joanna Hall    MR#:  [id number]  DATE OF BIRTH:  01/02/1966  SUBJECTIVE:  CHIEF COMPLAINT:   Chief Complaint  Patient presents with  . Emesis  . Diarrhea  Patient seen and evaluated today Had bowel movement Tolerating liquid diet well No abdominal distention and pain Passing gas Had some vaginal bleeding  REVIEW OF SYSTEMS:    ROS  CONSTITUTIONAL: No documented fever. No fatigue, weakness. No weight gain, no weight loss.  EYES: No blurry or double vision.  ENT: No tinnitus. No postnasal drip. No redness of the oropharynx.  RESPIRATORY: No cough, no wheeze, no hemoptysis. No dyspnea.  CARDIOVASCULAR: No chest pain. No orthopnea. No palpitations. No syncope.  GASTROINTESTINAL: No nausea, no vomiting or diarrhea. No abdominal pain. No melena or hematochezia.  GENITOURINARY: No dysuria or hematuria.  ENDOCRINE: No polyuria or nocturia. No heat or cold intolerance.  HEMATOLOGY: No anemia. No bruising. No bleeding.  INTEGUMENTARY: No rashes. No lesions.  MUSCULOSKELETAL: No arthritis. No swelling. No gout.  NEUROLOGIC: No numbness, tingling, or ataxia. No seizure-type activity.  PSYCHIATRIC: No anxiety. No insomnia. No ADD.   DRUG ALLERGIES:   Allergies  Allergen Reactions  . Ketamine Anxiety and Other (See Comments)    Syncope episode/confusion     VITALS:  Blood pressure (!) 155/74, pulse (!) 59, temperature 97.8 F (36.6 C), temperature source Oral, resp. rate 20, height 5' 8"  (1.727 m), weight 75.4 kg, SpO2 100 %.  PHYSICAL EXAMINATION:   Physical Exam  GENERAL:  53 y.o.-year-old patient lying in the bed with no acute distress.  EYES: Pupils equal, round, reactive to light and accommodation. No scleral icterus. Extraocular muscles intact.  HEENT: Head atraumatic, normocephalic. Oropharynx and nasopharynx clear.  NECK:  Supple, no jugular venous distention. No thyroid enlargement, no  tenderness.  LUNGS: Normal breath sounds bilaterally, no wheezing, rales, rhonchi. No use of accessory muscles of respiration.  CARDIOVASCULAR: S1, S2 normal. No murmurs, rubs, or gallops.  ABDOMEN: Soft, decreased tenderness, nondistended. Bowel sounds present. No organomegaly or mass.  EXTREMITIES: No cyanosis, clubbing or edema b/l.    NEUROLOGIC: Cranial nerves II through XII are intact. No focal Motor or sensory deficits b/l.   PSYCHIATRIC: The patient is alert and oriented x 3.  SKIN: No obvious rash, lesion, or ulcer.   LABORATORY PANEL:   CBC Recent Labs  Lab 06/23/19 0546  WBC 2.0*  HGB 10.7*  HCT 32.9*  PLT 122*   ------------------------------------------------------------------------------------------------------------------ Chemistries  Recent Labs  Lab 06/21/19 0839  06/24/19 0313  NA 137   < > 137  K 3.7   < > 3.7  CL 103   < > 107  CO2 23   < > 22  GLUCOSE 115*   < > 107*  BUN 18   < > <5*  CREATININE 0.75   < > 0.65  CALCIUM 9.5   < > 9.1  MG 1.5*   < > 1.8  AST 40  --   --   ALT 72*  --   --   ALKPHOS 145*  --   --   BILITOT 0.4  --   --    < > = values in this interval not displayed.   ------------------------------------------------------------------------------------------------------------------  Cardiac Enzymes No results for input(s): TROPONINI in the last 168 hours. ------------------------------------------------------------------------------------------------------------------  RADIOLOGY:  Dg Abd 1 View  Result Date: 06/24/2019 CLINICAL DATA:  53 year old female with evidence of  small bowel obstruction on CT several days ago. Metastatic cervical carcinoma. EXAM: ABDOMEN - 1 VIEW COMPARISON:  06/23/2019 and earlier. FINDINGS: Portable supine views at 0941 hours. Enteric tube has been removed. There is only trace residual oral contrast in the large bowel, at the splenic flexure and descending colon. Cholecystectomy clips and right abdominal  staple line redemonstrated. Non obstructed bowel gas pattern. Partially visible right chest port. Bilateral lung pulmonary nodules were better demonstrated by CT. Normal visible mediastinal contours. Degenerative changes in the spine. No acute osseous abnormality identified. IMPRESSION: 1. Continued non obstructed bowel gas pattern since yesterday. Minimal residual bowel contrast. Enteric tube removed. 2. Pulmonary metastases as demonstrated by CT. Electronically Signed   By: Genevie Ann M.D.   On: 06/24/2019 10:00   Dg Abd 1 View  Result Date: 06/22/2019 CLINICAL DATA:  NG tube placement. EXAM: ABDOMEN - 1 VIEW COMPARISON:  06/21/2019 FINDINGS: Nasogastric tube tip overlies the level of the mid stomach. Bowel gas pattern is nonobstructed. Numerous pulmonary nodules are again identified at the lung bases. IMPRESSION: Nasogastric tube tip to the level of the mid stomach. Electronically Signed   By: Nolon Nations M.D.   On: 06/22/2019 14:52   Dg Abd Portable 1v-small Bowel Obstruction Protocol-initial, 8 Hr Delay  Result Date: 06/23/2019 CLINICAL DATA:  Initial evaluation for SBO, 8 hours status post contrast administration. EXAM: PORTABLE ABDOMEN - 1 VIEW COMPARISON:  Prior radiograph from 06/22/2019. FINDINGS: Enteric tube in place with tip in side hole overlying the stomach, beyond the GE junction. Enteric contrast material seen diffusely throughout the colon to the level of the distal sigmoid. Some contrast is seen within the distal ileum within the right lower quadrant. No significant small bowel dilatation identified. No soft tissue mass or abnormal calcification. Cholecystectomy clips noted. No visible free air. Visualized lung bases are clear. Lumbar levoscoliosis with moderate multilevel degenerative spondylolysis noted. IMPRESSION: 1. Enteric contrast material diffusely throughout the colon to the level of the distal sigmoid, with few scattered nondilated contrast filled loops of distal small bowel  within the right lower quadrant. No significant small bowel dilatation identified. 2. Enteric tube in place with tip and side hole overlying the stomach, beyond the GE junction Electronically Signed   By: Jeannine Boga M.D.   On: 06/23/2019 00:54     ASSESSMENT AND PLAN:   53 year old female patient with history of factor V Leyden deficiency metastatic cervical cancer chronic hepatitis C with history of DVT PE on Xarelto for anticoagulation currently under hospitalist service  -Acute small bowel obstruction improving Off NG tube Advance to oral soft diet Appreciate surgery follow-up  -Vaginal bleed with history of metastatic cervical cancer GYN oncology follow-up with [facility] as outpatient Hemoglobin appears to be stable  -Diarrhea is improved  -Factor V Leiden mutation with history of DVT PE Discontinue heparin drip Start patient on oral Xarelto  -Metastatic cervical cancer Follow-up with GYN oncology at [facility] records are reviewed and case discussed with Care Management/Social Worker. Management plans discussed with the patient, family and they are in agreement.  CODE STATUS: Full code  DVT Prophylaxis: SCDs  TOTAL TIME TAKING CARE OF THIS PATIENT: 36 minutes.   POSSIBLE D/C IN 1 DAYS, DEPENDING ON CLINICAL CONDITION.  Saundra Shelling M.D on 06/24/2019 at 12:02 PM  Between 7am to 6pm - Pager - [phone]  After 6pm go to [email or web address] - password [email or web address] [facility]  Office  [phone]  CC: Primary care physician; Sanda Klein,  Satira Anis, MD  Note: This dictation was prepared with Dragon dictation along with smaller phrase technology. Any transcriptional errors that result from this process are unintentional.

## 2019-06-24 NOTE — Progress Notes (Signed)
ANTICOAGULATION CONSULT NOTE -  Pharmacy Consult for Heparin  Indication: DVT  Allergies  Allergen Reactions  . Ketamine Anxiety and Other (See Comments)    Syncope episode/confusion    Patient Measurements: Height: 5' 8"  (172.7 cm) Weight: 166 lb 3.2 oz (75.4 kg) IBW/kg (Calculated) : 63.9 Heparin Dosing Weight: 75.4 kg   Vital Signs: Temp: 97.9 F (36.6 C) (09/07 2205) Temp Source: Oral (09/07 2205) BP: 137/85 (09/07 2205) Pulse Rate: 68 (09/07 2205)  Labs: Recent Labs    06/21/19 0839  06/21/19 1817  06/22/19 0510 06/23/19 0546 06/23/19 1253 06/23/19 2009 06/24/19 0313  HGB 12.1  --   --   --  10.3* 10.7*  --   --   --   HCT 36.5  --   --   --  31.8* 32.9*  --   --   --   PLT 193  --   --   --  124* 122*  --   --   --   APTT  --   --  24   < > 98* 118* 81* 65* 84*  LABPROT  --   --  12.8  --   --   --   --   --   --   INR  --   --  1.0  --   --   --   --   --   --   HEPARINUNFRC  --    < > <0.10*  --  0.78* 0.75*  --   --  0.57  CREATININE 0.75  --   --   --   --  0.79  --   --  0.65   < > = values in this interval not displayed.    Estimated Creatinine Clearance: 83.9 mL/min (by C-G formula based on SCr of 0.65 mg/dL).   Medical History: Past Medical History:  Diagnosis Date  . Abdominal pain 06/10/2018  . Abnormal cervical Papanicolaou smear 09/18/2017  . Anxiety   . Aortic atherosclerosis ([place])   . Arthritis    neck and knees  . 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 Blood transfusion without reported diagnosis   . Cervical cancer ([place]) 09/2016   mets lung  . Chronic anal fissure   . Chronic diarrhea   . Dyspnea   . Erosive gastropathy 09/18/2017  . Factor V Leiden mutation ([place])   . Fecal incontinence   . Genital warts   . GERD (gastroesophageal reflux disease)   . GI bleed 10/08/2018  . Heart murmur   . Hematochezia   . Hemorrhoids   . 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 Hepatitis, chronic  ([place]) 05/05/2017  . History of cancer chemotherapy    completed 06/2017  . History of Clostridium difficile infection    while undergoing chemo.  Negative test 10/2017  . Ileocolic anastomotic leak   . 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 Intestinal infection due to Clostridium difficile 09/18/2017  . Macrocytic anemia with vitamin B12 deficiency   . Multiple gastric ulcers   . Nausea vomiting and diarrhea   . Pancolitis ([place]) 07/27/2018  . Perianal condylomata   . Pneumonia    History of  . Pulmonary nodules   . Rectal bleeding   . Small bowel obstruction ([place]) 08/2017  . Stiff neck    limited right turn  .  Vitamin D deficiency     Medications:  Medications Prior to Admission  Medication Sig Dispense Refill Last Dose  . amitriptyline (ELAVIL) 75 MG tablet Take 1 tablet (75 mg total) by mouth at bedtime. 90 tablet 1 06/20/2019 at Unknown time  . Calcium Carb-Cholecalciferol (CALCIUM 500 +D) 500-400 MG-UNIT TABS Take 2 tablets by mouth daily.   06/20/2019 at Unknown time  . diphenoxylate-atropine (LOMOTIL) 2.5-0.025 MG tablet TAKE (2) TABLETS FOUR TIMES DAILY. 240 tablet 0 06/20/2019 at Unknown time  . famotidine (PEPCID) 20 MG tablet Take 1 tablet (20 mg total) by mouth 2 (two) times daily for 5 days. 10 tablet 0 06/20/2019 at Unknown time  . feeding supplement, ENSURE ENLIVE, (ENSURE ENLIVE) LIQD Take 237 mLs by mouth 3 (three) times daily between meals. 237 mL 12 unknown at unknown  . hydrocortisone (CORTEF) 20 MG tablet Take 1 tablet (20 mg total) by mouth daily. 30 tablet 0 06/20/2019 at Unknown time  . hydrOXYzine (ATARAX/VISTARIL) 10 MG tablet Take 1 tablet (10 mg total) by mouth 3 (three) times daily as needed. 60 tablet 0 prn at prn  . hyoscyamine (LEVBID) 0.375 MG 12 hr tablet Take 1 tablet (0.375 mg total)by mouth 2 (two) times daily. 60 tablet 0 06/20/2019 at Unknown time  . Multiple Vitamins-Minerals (MULTIVITAMIN WITH MINERALS) tablet Take 1 tablet by mouth daily. 30  tablet 0 unknown at unknown  . ondansetron (ZOFRAN ODT) 4 MG disintegrating tablet Take 1 tablet (4 mg total) by mouth every 8 (eight) hours as needed for nausea or vomiting. 30 tablet 2 prn at prn  . oxyCODONE (OXYCONTIN) 15 mg 12 hr tablet Take 1 tablet (15 mg total) by mouth every 12 (twelve) hours. 60 tablet 0 06/20/2019 at Unknown time  . pantoprazole (PROTONIX) 40 MG tablet Take 1 tablet (40 mg total) by mouth daily. 90 tablet 1 06/20/2019 at Unknown time  . potassium chloride SA (K-DUR) 20 MEQ tablet Take 1 tablet (20 mEq total) by mouth daily as needed (if having nausea/vomiting- to supplements for lost potassium). 30 tablet 0 prn at prn  . promethazine (PHENERGAN) 25 MG tablet Take 1 tablet (25 mg total) by mouth every 8 (eight) hours as needed for nausea or vomiting. 30 tablet 3 prn at prn  . rivaroxaban (XARELTO) 20 MG TABS tablet Take 1 tablet (20 mg total) by mouth daily with supper. (Patient taking differently: Take 20 mg by mouth daily with supper. Takes in the morning) 30 tablet 6 06/20/2019 at Unknown time  . VENTOLIN HFA 108 (90 Base) MCG/ACT inhaler Inhale 1-2 puffs into the lungs every 4 (four) hours as needed for shortness of breath. 1 Inhaler 1 prn at prn  . Oxycodone HCl 10 MG TABS Take 1 tablet (10 mg total) by mouth every 6 (six) hours as needed (for breakthrough pain). 120 tablet 0 prn at prn    Assessment: Pharmacy consulted to dose heparin in this 53 year old female admitted with DVT.  Pt was on Xarelto 20 mg PO daily PTA, last dose was on 9/4 in AM.  CrCl = 83.9 ml/min  9/6 @ 0059 aPTT 96, therapeutic x 1 9/6 @ 0510 aPTT 98, therapeutic x 2.  HL does not correlate w/ aPTT.  H/H and PLTs are worse 9/7 @ 0546 aPTT 118, HL 0.75 - supratherapeutic 9/7 @ 1336 aPTT 81  - therapeutic x 1  9/7 @ 2000 aPTT 65, rate increased to 1150 units/hr 9/8 @ 0313 aPTT 84, HL 0.57 - therapeutic x  1  Goal of Therapy:  Heparin level 0.3-0.7 units/ml aPTT 66 - 102  seconds Monitor platelets by  anticoagulation protocol: Yes   Plan:  Will conitnue heparin drip at 1150 units/hr. HL and aPTT are correlating, will switch to HL only for monitoring. Next HL in 6 hours for confirmation. Daily CBC while on Heparin drip.  Paulina Fusi, PharmD, BCPS 06/24/2019 5:06 AM

## 2019-06-24 NOTE — TOC Initial Note (Signed)
Transition of Care [facility]) - Initial/Assessment Note    Patient Details  Name: Joanna Hall MRN: [id number] Date of Birth: 12-Apr-1966  Transition of [facility]) CM/SW Contact:    Beverly Sessions, RN Phone Number: 06/24/2019, 12:25 PM  Clinical Narrative:                 Patient admitted with SBO.  Clinically improving.   Patient lives at home with her parents.  Denies any issues with transportation.  Independent of all ADL's   PCP [place],  Pharmacy [facility].  Denies issues obtaining medications  PCP hospital follow up appointment to be scheduled prior to discharge  Patient high risk for readmission.  RNCM discussed barriers.  Admissions primarily correlate to patients chemo        Patient Goals and CMS Choice        Expected Discharge Plan and Services                                                Prior Living Arrangements/Services                       Activities of Daily Living Home Assistive Devices/Equipment: None ADL Screening (condition at time of admission) Patient's cognitive ability adequate to safely complete daily activities?: Yes Is the patient deaf or have difficulty hearing?: No Does the patient have difficulty seeing, even when wearing glasses/contacts?: No Does the patient have difficulty concentrating, remembering, or making decisions?: No Patient able to express need for assistance with ADLs?: No Does the patient have difficulty dressing or bathing?: No Independently performs ADLs?: Yes (appropriate for developmental age) Does the patient have difficulty walking or climbing stairs?: Yes Weakness of Legs: Both Weakness of Arms/Hands: None  Permission Sought/Granted                  Emotional Assessment              Admission diagnosis:  Small bowel obstruction ([place]) [K56.609] SBO (small bowel obstruction) ([place]) [K56.609] Metastatic cancer ([place]) [C79.9] Generalized abdominal pain  [R10.84] Encounter for imaging study to confirm nasogastric (NG) tube placement [Z01.89] Patient Active Problem List   Diagnosis Date Noted  . Malnutrition of moderate degree 06/22/2019  . Small bowel obstruction due to adhesions ([place]) 06/21/2019  . Acute gastroenteritis 06/14/2019  . Intractable nausea and vomiting 06/07/2019  . Chronic diastolic congestive heart failure ([place]) 05/30/2019  . Chest pain in adult 05/19/2019  . Intractable pain 05/19/2019  . Cancer associated pain   . Proctitis, radiation   . Palliative care encounter   . Encounter for monitoring opioid maintenance therapy 11/19/2018  . Adrenal insufficiency ([place]) 09/03/2018  . Chronic diarrhea 06/25/2018  . Chronic anticoagulation 06/25/2018  . Vomiting   . Encounter for antineoplastic chemotherapy 06/04/2018  . 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 Lung metastasis ([place]) 05/15/2018  . Closed fracture of distal end of radius 05/04/2018  . History of total knee arthroplasty 04/24/2018  . Osteoarthritis of left knee 02/28/2018  . Condyloma acuminatum of anus s/p ablation 02/22/2018 02/22/2018  . Condyloma acuminatum of vagina s/p ablation 02/22/2018 02/22/2018  . Positive ANA (antinuclear antibody) 02/04/2018  . Aortic atherosclerosis ([place]) 01/15/2018  . Elevated MCV 01/15/2018  . Anemia 01/15/2018  . Genital warts 01/15/2018  . Vitamin D deficiency 01/15/2018  .  Pernicious anemia   . Impingement syndrome of shoulder region 12/19/2017  . Multiple joint pain 12/19/2017  . Osteoarthritis of right knee 12/19/2017  . B12 deficiency 12/11/2017  . Lung nodules 12/11/2017  . History of Clostridium difficile infection 10/23/2017  . Factor V Leiden ([place]) 10/19/2017  . Goals of care, counseling/discussion 10/19/2017  . Cervical cancer, FIGO stage IB1 ([place]) 09/18/2017  . Chronic hepatitis C without hepatic coma ([place]) 09/18/2017  . Cytopenia 09/18/2017  . Diarrhea 09/18/2017  . Lower abdominal pain 09/18/2017  .  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 Malignant neoplasm of overlapping sites of cervix ([place]) 09/18/2017  . Renal insufficiency 09/18/2017  . Wound infection after surgery 09/12/2017  . Hypokalemia   . Hypomagnesemia   . Cervical arthritis 07/18/2017  . Dysuria 06/20/2017  . Metastatic cancer ([place]) 05/12/2017  . Essential hypertension 03/15/2017  . Anemia in other chronic diseases classified elsewhere 03/01/2017  . Chemotherapy-induced neutropenia ([place]) 01/29/2017  . 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), [place]) [place] [address] Phone: [phone] Fax: [phone]     Social Determinants of Health (SDOH) Interventions    Readmission Risk Interventions Readmission Risk Prevention Plan 06/24/2019  Transportation Screening Complete  Medication Review Press photographer) [place] Not Applicable  Some recent data might be hidden

## 2019-06-25 ENCOUNTER — Ambulatory Visit: Payer: Medicaid Other | Admitting: Surgery

## 2019-06-25 MED ORDER — HEPARIN SOD (PORK) LOCK FLUSH 100 UNIT/ML IV SOLN
500.0000 [IU] | Freq: Once | INTRAVENOUS | Status: AC
  Administered 2019-06-25: 17:00:00 500 [IU] via INTRAVENOUS
  Filled 2019-06-25: qty 5

## 2019-06-25 NOTE — Discharge Summary (Signed)
[facility] NAME: Joanna Hall    MR#:  [id number]  DATE OF BIRTH:  04-Oct-1966  DATE OF ADMISSION:  06/21/2019 ADMITTING PHYSICIAN: Lang Snow, NP  DATE OF DISCHARGE: 06/25/2019  PRIMARY CARE PHYSICIAN: Arnetha Courser, MD   ADMISSION DIAGNOSIS:  Small bowel obstruction ([place]) [K56.609] SBO (small bowel obstruction) ([place]) [K56.609] Metastatic cancer ([place]) [C79.9] Generalized abdominal pain [R10.84] Encounter for imaging study to confirm nasogastric (NG) tube placement [Z01.89]  DISCHARGE DIAGNOSIS:  Active Problems:   Small bowel obstruction due to adhesions (HCC)   Malnutrition of moderate degree Metastatic cervical cancer 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 Abdominal pain 06/10/2018  . Abnormal cervical Papanicolaou smear 09/18/2017  . Anxiety   . Aortic atherosclerosis ([place])   . Arthritis    neck and knees  . 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 Blood transfusion without reported diagnosis   . Cervical cancer ([place]) 09/2016   mets lung  . Chronic anal fissure   . Chronic diarrhea   . Dyspnea   . Erosive gastropathy 09/18/2017  . Factor V Leiden mutation ([place])   . Fecal incontinence   . Genital warts   . GERD (gastroesophageal reflux disease)   . GI bleed 10/08/2018  . Heart murmur   . Hematochezia   . Hemorrhoids   . 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 Hepatitis, chronic ([place]) 05/05/2017  . History of cancer chemotherapy    completed 06/2017  . History of Clostridium difficile infection    while undergoing chemo.  Negative test 10/2017  . Ileocolic anastomotic leak   . 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 Intestinal infection due to Clostridium difficile 09/18/2017  . Macrocytic anemia with vitamin B12 deficiency   . Multiple gastric ulcers   . Nausea  vomiting and diarrhea   . Pancolitis ([place]) 07/27/2018  . Perianal condylomata   . Pneumonia    History of  . Pulmonary nodules   . Rectal bleeding   . Small bowel obstruction ([place]) 08/2017  . Stiff neck    limited right turn  . 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 Patient has had multiple admission for dehydration, hypomagnesium and hypokalemia secondary to profuse, watery, intermittently diarrhea, nausea /voming and abdominal pain on 8/22 and  most recently on 8/29. 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 She was thus started on Cipro and metronidazole for C. difficile treatment in the outpatient setting. Cdiff and GI panel at that time was negative there abx was discontinued. The patient's symptoms has worsened despite  treatment. She also complained of diffuse abdominal pain worse since yesterday, straining to have a bowel movement and diarrhea with streaks of blood, and poor oral intake. Two days prior to onset of her symptoms, she had received her third cycle of pembrolizumab Beryle Flock) therapy.  Patient denies fevers or chills, chest pain, diaphoresis or any other symptoms.  On  arrival to the ED, she was afebrile with blood pressure 172/110 mm Hg and pulse rate 94 beats/min. There were no focal neurological deficits; she was alert and oriented x4, and he did not demonstrate any memory deficits.  Initial labs revealed glucose 115, ALT 72, alk phos 145, CBC unremarkable, lipase, 25, mag 1.5, COVID negative.  CT abdomen pelvis shows small bowel obstruction likely due to  addition without bowel ischemia and colonic wall thickening of the sigmoid and colon compatible with chronic colitis.  Given this finding general surgery was consulted for further evaluation and patient will be admitted for medical management and to hospitalist service.  HOSPITAL COURSE:  Patient was admitted to medical floor.  She was kept n.p.o. and was started on IV fluids.  Patient was worked up with CT abdomen which showed bowel obstruction.  Nasogastric tube was used for decompression.  Surgery consultation was done who recommended conservative management.  Immune mediated colitis was suspected.  Patient was also seen by oncology service during hospitalization.  Stool for C. difficile toxin was negative.  COVID-19 test was negative.  Bowel obstruction resolved conservatively.  Patient tolerated diet well.  Electrolytes were supplemented.  Patient had scanty vaginal bleeding.  Advised patient to follow-up with clinic oncology service at [facility].  Patient was anticoagulated with heparin drip when she was n.p.o.. She was switched back to Xarelto for anticoagulation once she started tolerating diet well.  CONSULTS OBTAINED:  Treatment Team:  Benjamine Sprague, DO Sindy Guadeloupe, MD  DRUG ALLERGIES:   Allergies  Allergen Reactions  . Ketamine Anxiety and Other (See Comments)    Syncope episode/confusion     DISCHARGE MEDICATIONS:   Allergies as of 06/25/2019      Reactions   Ketamine Anxiety, Other (See Comments)   Syncope episode/confusion      Medication List    TAKE these medications   amitriptyline 75 MG tablet Commonly known as: ELAVIL Take 1 tablet (75 mg total) by mouth at bedtime.   Calcium 500 +D 500-400 MG-UNIT Tabs Generic drug: Calcium Carb-Cholecalciferol Take 2 tablets by mouth daily.   diphenoxylate-atropine 2.5-0.025 MG tablet Commonly known as: LOMOTIL TAKE (2) TABLETS FOUR TIMES DAILY.   famotidine 20 MG tablet Commonly known as: PEPCID Take 1 tablet (20 mg total)  by mouth 2 (two) times daily for 5 days.   feeding supplement (ENSURE ENLIVE) Liqd Take 237 mLs by mouth 3 (three) times daily between meals.   hydrocortisone 20 MG tablet Commonly known as: CORTEF Take 1 tablet (20 mg total) by mouth daily.   hydrOXYzine 10 MG tablet Commonly known as: ATARAX/VISTARIL Take 1 tablet (10 mg total) by mouth 3 (three) times daily as needed.   hyoscyamine 0.375 MG 12 hr tablet Commonly known as: LEVBID Take 1 tablet (0.375 mg total)by mouth 2 (two) times daily.   multivitamin with minerals tablet Take 1 tablet by mouth daily.   ondansetron 4 MG disintegrating tablet Commonly known as: Zofran ODT Take 1 tablet (4 mg total) by mouth every 8 (eight) hours as needed for nausea or vomiting.   oxyCODONE 15 mg 12 hr tablet Commonly known as: OXYCONTIN Take 1 tablet (15 mg total) by mouth every 12 (twelve) hours.   Oxycodone HCl 10 MG Tabs Take 1 tablet (10 mg total) by mouth every 6 (six) hours as needed (for breakthrough pain).   pantoprazole 40 MG tablet Commonly known as: PROTONIX Take 1 tablet (40 mg total) by mouth daily.   potassium chloride SA 20  MEQ tablet Commonly known as: K-DUR Take 1 tablet (20 mEq total) by mouth daily as needed (if having nausea/vomiting- to supplements for lost potassium).   promethazine 25 MG tablet Commonly known as: PHENERGAN Take 1 tablet (25 mg total) by mouth every 8 (eight) hours as needed for nausea or vomiting.   rivaroxaban 20 MG Tabs tablet Commonly known as: XARELTO Take 1 tablet (20 mg total) by mouth daily with supper. What changed: additional instructions   Ventolin HFA 108 (90 Base) MCG/ACT inhaler Generic drug: albuterol Inhale 1-2 puffs into the lungs every 4 (four) hours as needed for shortness of breath.       Today  Patient seen today No abdominal pain Tolerating diet well No fever Hemodynamically stable VITAL SIGNS:  Blood pressure (!) 156/99, pulse 78, temperature (!) 97.5 F  (36.4 C), temperature source Oral, resp. rate 20, height 5' 8"  (1.727 m), weight 75.4 kg, SpO2 100 %.  I/O:    Intake/Output Summary (Last 24 hours) at 06/25/2019 1028 Last data filed at 06/25/2019 0519 Gross per 24 hour  Intake 694.41 ml  Output -  Net 694.41 ml    PHYSICAL EXAMINATION:  Physical Exam  GENERAL:  53 y.o.-year-old patient lying in the bed with no acute distress.  LUNGS: Normal breath sounds bilaterally, no wheezing, rales,rhonchi or crepitation. No use of accessory muscles of respiration.  CARDIOVASCULAR: S1, S2 normal. No murmurs, rubs, or gallops.  ABDOMEN: Soft, non-tender, non-distended. Bowel sounds present. No organomegaly or mass.  NEUROLOGIC: Moves all 4 extremities. PSYCHIATRIC: The patient is alert and oriented x 3.  SKIN: No obvious rash, lesion, or ulcer.   DATA REVIEW:   CBC Recent Labs  Lab 06/23/19 0546 06/24/19 1236  WBC 2.0*  --   HGB 10.7* 11.4*  HCT 32.9* 35.1*  PLT 122*  --     Chemistries  Recent Labs  Lab 06/21/19 0839  06/24/19 0313  NA 137   < > 137  K 3.7   < > 3.7  CL 103   < > 107  CO2 23   < > 22  GLUCOSE 115*   < > 107*  BUN 18   < > <5*  CREATININE 0.75   < > 0.65  CALCIUM 9.5   < > 9.1  MG 1.5*   < > 1.8  AST 40  --   --   ALT 72*  --   --   ALKPHOS 145*  --   --   BILITOT 0.4  --   --    < > = values in this interval not displayed.    Cardiac Enzymes No results for input(s): TROPONINI in the last 168 hours.  Microbiology Results  Results for orders placed or performed during the hospital encounter of 06/21/19  SARS Coronavirus 2 [facility] order, Performed in [facility] hospital lab) Nasopharyngeal Nasopharyngeal Swab     Status: None   Collection Time: 06/21/19  8:42 AM   Specimen: Nasopharyngeal Swab  Result Value Ref Range Status   SARS Coronavirus 2 NEGATIVE NEGATIVE Final    Comment: (NOTE) If result is NEGATIVE SARS-CoV-2 target nucleic acids are NOT DETECTED. The SARS-CoV-2 RNA is generally  detectable in upper and lower  respiratory specimens during the acute phase of infection. The lowest  concentration of SARS-CoV-2 viral copies this assay can detect is 250  copies / mL. A negative result does not preclude SARS-CoV-2 infection  and should not be used as the sole basis for  treatment or other  patient management decisions.  A negative result may occur with  improper specimen collection / handling, submission of specimen other  than nasopharyngeal swab, presence of viral mutation(s) within the  areas targeted by this assay, and inadequate number of viral copies  (<250 copies / mL). A negative result must be combined with clinical  observations, patient history, and epidemiological information. If result is POSITIVE SARS-CoV-2 target nucleic acids are DETECTED. The SARS-CoV-2 RNA is generally detectable in upper and lower  respiratory specimens dur ing the acute phase of infection.  Positive  results are indicative of active infection with SARS-CoV-2.  Clinical  correlation with patient history and other diagnostic information is  necessary to determine patient infection status.  Positive results do  not rule out bacterial infection or co-infection with other viruses. If result is PRESUMPTIVE POSTIVE SARS-CoV-2 nucleic acids MAY BE PRESENT.   A presumptive positive result was obtained on the submitted specimen  and confirmed on repeat testing.  While 2019 novel coronavirus  (SARS-CoV-2) nucleic acids may be present in the submitted sample  additional confirmatory testing may be necessary for epidemiological  and / or clinical management purposes  to differentiate between  SARS-CoV-2 and other Sarbecovirus currently known to infect humans.  If clinically indicated additional testing with an alternate test  methodology [phone]) is advised. The SARS-CoV-2 RNA is generally  detectable in upper and lower respiratory sp ecimens during the acute  phase of infection. The  expected result is Negative. Fact Sheet for Patients:  StrictlyIdeas.no Fact Sheet for Healthcare Providers: BankingDealers.co.za This test is not yet approved or cleared by the [place] FDA and has been authorized for detection and/or diagnosis of SARS-CoV-2 by FDA under an Emergency Use Authorization (EUA).  This EUA will remain in effect (meaning this test can be used) for the duration of the COVID-19 declaration under Section 564(b)(1) of the Act, 21 U.S.C. section 360bbb-3(b)(1), unless the authorization is terminated or revoked sooner. Performed at [facility], [address] [place], [place] [address]   C difficile quick scan w PCR reflex     Status: None   Collection Time: 06/21/19  4:44 PM   Specimen: STOOL  Result Value Ref Range Status   C Diff antigen NEGATIVE NEGATIVE Final   C Diff toxin NEGATIVE NEGATIVE Final   C Diff interpretation No C. difficile detected.  Final    Comment: Performed at [facility], [address] [place], [place] [address]   *Note: Due to a large number of results and/or encounters for the requested time period, some results have not been displayed. A complete set of results can be found in Results Review.    RADIOLOGY:  Dg Abd 1 View  Result Date: 06/24/2019 CLINICAL DATA:  53 year old female with evidence of small bowel obstruction on CT several days ago. Metastatic cervical carcinoma. EXAM: ABDOMEN - 1 VIEW COMPARISON:  06/23/2019 and earlier. FINDINGS: Portable supine views at 0941 hours. Enteric tube has been removed. There is only trace residual oral contrast in the large bowel, at the splenic flexure and descending colon. Cholecystectomy clips and right abdominal staple line redemonstrated. Non obstructed bowel gas pattern. Partially visible right chest port. Bilateral lung pulmonary nodules were better demonstrated by CT. Normal visible mediastinal contours. Degenerative  changes in the spine. No acute osseous abnormality identified. IMPRESSION: 1. Continued non obstructed bowel gas pattern since yesterday. Minimal residual bowel contrast. Enteric tube removed. 2. Pulmonary metastases as demonstrated by CT. Electronically  Signed   By: Genevie Ann M.D.   On: 06/24/2019 10:00    Follow up with PCP in 1 week.  Management plans discussed with the patient, family and they are in agreement.  CODE STATUS: Full code    Code Status Orders  (From admission, onward)         Start     Ordered   06/21/19 1306  Full code  Continuous     06/21/19 1305        Code Status History    Date Active Date Inactive Code Status Order ID Comments User Context   06/14/2019 0418 06/15/2019 1902 Full Code [id number]  Sidney Ace Arvella Merles, MD ED   06/07/2019 1536 06/10/2019 2044 Full Code [id number]  Sela Hua, MD Inpatient   05/19/2019 0313 05/22/2019 1545 Full Code [id number]  Mayer Camel, NP Inpatient   11/19/2018 1803 11/26/2018 2238 Full Code [id number]  N[place]oles Mango, MD Inpatient   10/17/2018 1910 10/18/2018 2119 Full Code [id number]  Hillary Bow, MD Inpatient   10/08/2018 1443 10/11/2018 1920 Full Code [id number]  N[place]oles Mango, MD Inpatient   07/27/2018 1621 08/01/2018 1432 Full Code [id number]  Demetrios Loll, MD Inpatient   06/10/2018 1754 06/12/2018 1614 Full Code [id number]  Dustin Flock, MD Inpatient   04/24/2018 1451 04/27/2018 1700 Full Code [id number]  Lovell Sheehan, MD Inpatient   12/03/2017 1619 12/08/2017 1606 Full Code [id number]  Epifanio Lesches, MD ED   09/12/2017 0024 09/15/2017 1721 Full Code [id number]  [name], MD ED   08/27/2017 0223 09/05/2017 1355 Full Code [id number]  Herbert Pun, MD Inpatient   Advance Care Planning Activity      TOTAL TIME TAKING CARE OF THIS PATIENT ON DAY OF DISCHARGE: more than 34 minutes.   Saundra Shelling M.D on 06/25/2019 at 10:28 AM  Between 7am to 6pm - Pager - [phone]  After 6pm go to [email or web address] - password [email or web address]  [facility]  Office  [phone]  CC: Primary care physician; Arnetha Courser, MD  Note: This dictation was prepared with Dragon dictation along with smaller phrase technology. Any transcriptional errors that result from this process are unintentional.

## 2019-06-25 NOTE — Progress Notes (Signed)
   06/25/19 1100  Clinical Encounter Type  Visited With Patient  Visit Type Follow-up;Psychological support;Spiritual support  Ch followed up with the pt. Pt was in good mood ready to be discharged. 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 Pt also talked about her close relationship with her granddaughters and the loss of her daughter who she lost to alcohol overdose. Pt wants to be someone who can be helpful to others and she is doing her best to spread positive energy to people she encounter. Ch affirmed her effort by recognizing her brightness. Pt appreciated the visit and said she felt better. Ch will continue following her in the cancer center.

## 2019-06-25 NOTE — TOC Transition Note (Signed)
Transition of Care [facility]) - CM/SW Discharge Note   Patient Details  Name: Joanna Hall MRN: [id number] Date of Birth: Sep 06, 1966  Transition of Care [facility] CM/SW Contact:  Beverly Sessions, RN Phone Number: 06/25/2019, 2:26 PM   Clinical Narrative:    Patient discharge.  No discharge needs identified    Final next level of care: Home/Self Care Barriers to Discharge: Barriers Resolved   Patient Goals and CMS Choice        Discharge Placement                       Discharge Plan and Services                                     Social Determinants of Health (SDOH) Interventions     Readmission Risk Interventions Readmission Risk Prevention Plan 06/25/2019 06/24/2019  Transportation Screening Complete Complete  Medication Review Press photographer) Complete Complete  PCP or Specialist appointment within 3-5 days of discharge (No Data) -  Palliative Care Screening Not Applicable -  [place] Not Applicable Not Applicable  Some recent data might be hidden

## 2019-06-26 ENCOUNTER — Inpatient Hospital Stay: Payer: Medicaid Other

## 2019-06-26 ENCOUNTER — Telehealth: Payer: Self-pay

## 2019-06-26 NOTE — Telephone Encounter (Signed)
Attempted to reach patient for TCM call. Unable to leave message due to mailbox full. Pt may contact me directly at [phone]

## 2019-06-28 ENCOUNTER — Other Ambulatory Visit: Payer: Self-pay | Admitting: Internal Medicine

## 2019-06-29 ENCOUNTER — Encounter: Payer: Self-pay | Admitting: Gastroenterology

## 2019-06-30 ENCOUNTER — Encounter: Payer: Self-pay | Admitting: Oncology

## 2019-06-30 ENCOUNTER — Inpatient Hospital Stay (HOSPITAL_BASED_OUTPATIENT_CLINIC_OR_DEPARTMENT_OTHER): Payer: Medicaid Other | Admitting: Oncology

## 2019-06-30 ENCOUNTER — Inpatient Hospital Stay: Payer: Medicaid Other

## 2019-06-30 ENCOUNTER — Other Ambulatory Visit: Payer: Self-pay

## 2019-06-30 ENCOUNTER — Encounter: Payer: Self-pay | Admitting: Internal Medicine

## 2019-06-30 ENCOUNTER — Other Ambulatory Visit: Payer: Self-pay | Admitting: Oncology

## 2019-06-30 VITALS — BP 124/94 | HR 92 | Temp 98.1°F | Resp 20

## 2019-06-30 DIAGNOSIS — Z86711 Personal history of pulmonary embolism: Secondary | ICD-10-CM | POA: Diagnosis not present

## 2019-06-30 DIAGNOSIS — E860 Dehydration: Secondary | ICD-10-CM

## 2019-06-30 DIAGNOSIS — R197 Diarrhea, unspecified: Secondary | ICD-10-CM | POA: Diagnosis not present

## 2019-06-30 DIAGNOSIS — R531 Weakness: Secondary | ICD-10-CM

## 2019-06-30 DIAGNOSIS — C7802 Secondary malignant neoplasm of left lung: Secondary | ICD-10-CM | POA: Diagnosis not present

## 2019-06-30 DIAGNOSIS — Z7901 Long term (current) use of anticoagulants: Secondary | ICD-10-CM | POA: Diagnosis not present

## 2019-06-30 DIAGNOSIS — Z9221 Personal history of antineoplastic chemotherapy: Secondary | ICD-10-CM | POA: Diagnosis not present

## 2019-06-30 DIAGNOSIS — Z95828 Presence of other vascular implants and grafts: Secondary | ICD-10-CM

## 2019-06-30 DIAGNOSIS — Z9114 Patient's other noncompliance with medication regimen: Secondary | ICD-10-CM | POA: Diagnosis not present

## 2019-06-30 DIAGNOSIS — Z923 Personal history of irradiation: Secondary | ICD-10-CM | POA: Diagnosis not present

## 2019-06-30 DIAGNOSIS — R112 Nausea with vomiting, unspecified: Secondary | ICD-10-CM | POA: Diagnosis not present

## 2019-06-30 DIAGNOSIS — C539 Malignant neoplasm of cervix uteri, unspecified: Secondary | ICD-10-CM | POA: Diagnosis present

## 2019-06-30 DIAGNOSIS — Z90722 Acquired absence of ovaries, bilateral: Secondary | ICD-10-CM | POA: Diagnosis not present

## 2019-06-30 DIAGNOSIS — M109 Gout, unspecified: Secondary | ICD-10-CM | POA: Diagnosis not present

## 2019-06-30 DIAGNOSIS — Z86718 Personal history of other venous thrombosis and embolism: Secondary | ICD-10-CM | POA: Diagnosis not present

## 2019-06-30 DIAGNOSIS — C538 Malignant neoplasm of overlapping sites of cervix uteri: Secondary | ICD-10-CM | POA: Diagnosis not present

## 2019-06-30 DIAGNOSIS — G629 Polyneuropathy, unspecified: Secondary | ICD-10-CM | POA: Diagnosis not present

## 2019-06-30 DIAGNOSIS — Z79899 Other long term (current) drug therapy: Secondary | ICD-10-CM | POA: Diagnosis not present

## 2019-06-30 DIAGNOSIS — C7801 Secondary malignant neoplasm of right lung: Secondary | ICD-10-CM | POA: Diagnosis not present

## 2019-06-30 DIAGNOSIS — C530 Malignant neoplasm of endocervix: Secondary | ICD-10-CM

## 2019-06-30 DIAGNOSIS — Z5112 Encounter for antineoplastic immunotherapy: Secondary | ICD-10-CM | POA: Diagnosis present

## 2019-06-30 LAB — CBC WITH DIFFERENTIAL/PLATELET
Abs Immature Granulocytes: 0.04 10*3/uL (ref 0.00–0.07)
Basophils Absolute: 0 10*3/uL (ref 0.0–0.1)
Basophils Relative: 0 %
Eosinophils Absolute: 0 10*3/uL (ref 0.0–0.5)
Eosinophils Relative: 1 %
HCT: 38.3 % (ref 36.0–46.0)
Hemoglobin: 12.7 g/dL (ref 12.0–15.0)
Immature Granulocytes: 1 %
Lymphocytes Relative: 13 %
Lymphs Abs: 0.8 10*3/uL (ref 0.7–4.0)
MCH: 33.1 pg (ref 26.0–34.0)
MCHC: 33.2 g/dL (ref 30.0–36.0)
MCV: 99.7 fL (ref 80.0–100.0)
Monocytes Absolute: 0.2 10*3/uL (ref 0.1–1.0)
Monocytes Relative: 4 %
Neutro Abs: 5.2 10*3/uL (ref 1.7–7.7)
Neutrophils Relative %: 81 %
Platelets: 189 10*3/uL (ref 150–400)
RBC: 3.84 MIL/uL — ABNORMAL LOW (ref 3.87–5.11)
RDW: 13.8 % (ref 11.5–15.5)
WBC: 6.4 10*3/uL (ref 4.0–10.5)
nRBC: 0 % (ref 0.0–0.2)

## 2019-06-30 LAB — COMPREHENSIVE METABOLIC PANEL
ALT: 76 U/L — ABNORMAL HIGH (ref 0–44)
AST: 50 U/L — ABNORMAL HIGH (ref 15–41)
Albumin: 3.9 g/dL (ref 3.5–5.0)
Alkaline Phosphatase: 118 U/L (ref 38–126)
Anion gap: 11 (ref 5–15)
BUN: 10 mg/dL (ref 6–20)
CO2: 25 mmol/L (ref 22–32)
Calcium: 9.3 mg/dL (ref 8.9–10.3)
Chloride: 102 mmol/L (ref 98–111)
Creatinine, Ser: 0.8 mg/dL (ref 0.44–1.00)
GFR calc Af Amer: >60 mL/min (ref >60)
GFR calc non Af Amer: >60 mL/min (ref >60)
Glucose, Bld: 156 mg/dL — ABNORMAL HIGH (ref 70–99)
Potassium: 3.7 mmol/L (ref 3.5–5.1)
Sodium: 138 mmol/L (ref 135–145)
Total Bilirubin: 0.4 mg/dL (ref 0.3–1.2)
Total Protein: 7.2 g/dL (ref 6.5–8.1)

## 2019-06-30 LAB — MAGNESIUM: Magnesium: 1.5 mg/dL — ABNORMAL LOW (ref 1.7–2.4)

## 2019-06-30 MED ORDER — SODIUM CHLORIDE 0.9% FLUSH
10.0000 mL | INTRAVENOUS | Status: DC | PRN
  Administered 2019-06-30: 13:00:00 10 mL via INTRAVENOUS
  Filled 2019-06-30: qty 10

## 2019-06-30 MED ORDER — HEPARIN SOD (PORK) LOCK FLUSH 100 UNIT/ML IV SOLN
500.0000 [IU] | Freq: Once | INTRAVENOUS | Status: AC
  Administered 2019-06-30: 500 [IU] via INTRAVENOUS

## 2019-06-30 MED ORDER — SODIUM CHLORIDE 0.9 % IV SOLN: 2.0000 g | Freq: Once | INTRAVENOUS | Status: DC

## 2019-06-30 MED ORDER — MAGNESIUM SULFATE 2 GM/50ML IV SOLN
2.0000 g | Freq: Once | INTRAVENOUS | Status: AC
  Administered 2019-06-30: 2 g via INTRAVENOUS
  Filled 2019-06-30: qty 50

## 2019-06-30 MED ORDER — OXYCODONE HCL 10 MG PO TABS: 10.0000 mg | ORAL_TABLET | Freq: Four times a day (QID) | ORAL | 0 refills | Status: DC | PRN

## 2019-06-30 MED ORDER — SODIUM CHLORIDE 0.9 % IV SOLN
Freq: Once | INTRAVENOUS | Status: AC
  Administered 2019-06-30: 13:00:00 via INTRAVENOUS
  Filled 2019-06-30: qty 250

## 2019-06-30 NOTE — Progress Notes (Signed)
Symptom Management Consult note [facility]  [phone] [phone]  Patient Care Team: Arnetha Courser, MD as PCP - General (Family Medicine) Mellody Drown, MD as Consulting Physician (Obstetrics and Gynecology) [name], MD as Consulting Physician (Gastroenterology) Michael Boston, MD as Consulting Physician (General Surgery) Lovell Sheehan, MD as Consulting Physician (Orthopedic Surgery) Cammie Sickle, MD as Consulting Physician (Oncology) Earnestine Leys, MD as Consulting Physician (Orthopedic Surgery)   Name of the patient: Joanna Hall  [id number]  1966-09-21   Date of visit: 06/30/2019   Diagnosis-cervical cancer  Chief complaint/ Reason for visit-diarrhea and nausea  Heme/Onc history:  Oncology History Overview Note  # dec 2017- CERVICAL ADENO CA [[place]]; Stage IB [Dr. Christene Slates at [facility] in [place], [place];No surgery- Chemo-RT;  # 2018-sep - lung nodules [in [place]]  # AUG 20th, 2019-#1 Carbo-Taxol s/p 6 cycles- [No avastin sec to blood clots]: Feb 2020-bilateral lung nodules with 1 cm in size. March 2020- finished carbo-taxol #7; poor tolerance to [place].  # April 15 th 2020- Start Taxol weekly x 3; one week OFF;  # July 2020-CT- bil cavitary lesions- slightly bigger by few mm/overall STABLE; CT a/p-NED. 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  She received treatment from 11/2016-05/2017.  01/2017 cisplatin x 2 and carboplatin x 1 (01/29/2017) due  to ARF and XRT.  XRT was followed by T & O on 02/01/2017 and T & N 02/10/2017 and 02/20/2017.  Course was complicated by 80 pound weight loss, nausea, vomiting, electrolyte wasting (potassium and magnesium).  She describes that.  Is been sick constantly requiring at least 20 hospitalizations.  Follow-up CT chest and PET on 06/2017. Per patient, 'radiation worked' and no disease in the abdomen.  At that time she was noted to have lung nodules that were growing and follow-up imaging was scheduled for 10/2017.  She was admitted to hospital in [place] for small bowel obstruction which was managed conservatively and she was home for a week prior to traveling to [place] for Thanksgiving holiday where she has family.  She presented to ER in [place] on 08/2017 with nausea, vomiting, and lower abdominal pain.  Symptoms did not respond to conservative treatment.   CT on 08/26/2017 revealed small bowel obstruction with transition in the pelvis just superior to the uterus rather was a long segment of distal ileum with fatty wall thickening compatible with chronic inflammation and/or radiation enteritis. Imaging showed numerous pulmonary nodules consistent with metastatic disease. She underwent laparotomy and right ileocolectomy on 08/31/2017 at [facility].  Surgical findings revealed a thickened, matted, and scarred piece of distal small bowel close to the ileocecal valve.  She was discharged on 09/05/2017.  Pain markedly increased in intensity and imaging was performed on 09/11/2017 which revealed: Debris within the anterior abdominal wall incision concerning for infection versus packing material, s/p post ileo-colectomy with expected postoperative changes, mild colonic ileus, numerous pulmonary nodules highly concerning for metastatic disease, punctate nonobstructing nephrolithiasis.  Staples were removed and one was packed.  She was started on doxycycline.  Abdominal and pelvic CT  without contrast on  09/11/2017 revealed debris within anterior abdominal wall incision concerning for infection, versus packing material.   She is s/p ileocolectomy with expected postoperative changes and mild colonic ileus.  There were numerous pulmonary nodules highly concerning for metastatic disease and punctate nonobstructing nephrolithiasis. She was readmitted on 09/12/2017.  She describes the onset of lower abdominal pain on 09/09/2017.  Pain markedly increased in intensity on 09/11/2017.   Staples were removed and the wound packed.  She was started on doxycycline.  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   PET scan was ordered to evaluate enlarging lung nodules with concern for recurrent cervical cancer but scan was delayed due to insurance and need to be performed in [place].  Presented to ER on 12/03/2017 for abdominal pain and emesis.  Imaging concerning for worsening possible uterine infarct and she was admitted to hospital.  Pelvic MRI was unremarkable.  Remote scarring type changes of uterus thought to be possibly related to radiation.  She was discharged on 12/08/2017.  Underwent endoscopy and colonoscopy on 12/20/2017.    On 02/22/2018 she underwent laser ablation of condylomata around the anus and vagina under anesthesia with Dr. Johney Maine.   04/15/2018:  Chest, abdomen, and pelvis CT revealed innumerable (> 100) cavitary nodules scattered in the lungs, moderately enlarging compared to the 11/08/2017 PET-CT, suspicious for metastatic disease.  One index node in the RLL measures 1.0 x 1.1 cm (previously 0.6 x 0.6 cm).  There were  no new nodules.  There was an ill-defined wall thickening in the rectosigmoid with surrounding stranding along fascia planes, probably sequela from prior radiation therapy.  There was multilevel lumbar impingement due to spondylosis and degenerative disc disease.  There was heterogeneous enhancement in the uterus (some possibly from prior radiation therapy).   04/23/2018:  PET scan revealed numerous scattered solid and cavitary nodules in the lungs stable increased in size compared to the prior PET-CT from 11/08/2017. Largest nodule was 1.1 cm in the LUL (SUV 1.9).  These demonstrated low-grade metabolic activity up to a maximum SUV of about 2.3, increased from 11/08/2017.    Case was discussed at tumor board on 04/25/2018. Consensus to pursue CT-guided biopsy (05/06/18) which revealed: Metastatic adenocarcinoma, morphologically consistent with cervical adenocarcinoma.  She has history of chronic hepatitis C which is managed by GI.  Hepatitis C genotype is 2a/2c.  She receives B12 injections for history of B12 deficiency.  On 04/24/2018 she underwent left total knee replacement with Dr. [name]. --------------------------------------------------------   DIAGNOSIS: Cervical adenocarcinoma  STAGE: IV        ;GOALS: Palliative  CURRENT/MOST RECENT THERAPY : Keytruda    Malignant neoplasm of overlapping sites of cervix ([place])  01/29/2019 - 04/23/2019 Chemotherapy   The patient had PACLitaxel (TAXOL) 156 mg in sodium chloride 0.9 % 250 mL chemo infusion (</= 67m/m2), 80 mg/m2 = 156 mg, Intravenous,  Once, 3 of 4 cycles Dose modification: 65 mg/m2 (original dose 80 mg/m2, Cycle 1, Reason: Provider Judgment) Administration: 156 mg (01/29/2019), 156 mg (02/05/2019), 126 mg (02/12/2019), 126 mg (02/26/2019), 126 mg (03/13/2019), 126 mg (03/27/2019), 126 mg (03/20/2019), 126 mg (04/03/2019), 126 mg (04/10/2019)  for chemotherapy treatment.    05/08/2019 -  Chemotherapy   The patient had pembrolizumab (KEYTRUDA) 200 mg  in sodium chloride 0.9 % 50 mL chemo infusion, 200 mg, Intravenous, Once, 3 of 6 cycles Administration: 200 mg (05/08/2019), 200 mg (05/29/2019), 200 mg (06/19/2019)  for chemotherapy treatment.      Interval history-patient presents to symptom management today with above history for complaints of intractable nausea and diarrhea.  She was seen in infusion while receiving IV magnesium and IV fluids for chronic electrolyte replacement.  She has received a total of 3 doses of Keytruda which she has not tolerated well.  She was admitted to the hospital on 3 separate occasions; 2 times for acute gastroenteritis thought to be due to either marijuana induced or radiation induced and lastly for small bowel obstruction d/t adhesions where she was managed conservatively and discharged.  Her last Keytruda treatment was on 06/19/2019.  She continues to have nausea and dry heaves even while taking Zofran, Compazine and Phenergan at home.  She is also having persistent diarrhea.  States she is taking all of her antidiarrheals including hydrocortisone, Imodium, Lomotil and Viberzi.  She complains of intermittent abdominal pain.  She denies any recent fevers, easy bleeding or bruising, chest pain, shortness of breath, constipation or urinary issues.  ECOG FS:1 - Symptomatic but completely ambulatory  Review of systems- Review of Systems  Constitutional: Positive for malaise/fatigue.  Gastrointestinal: Positive for abdominal pain, diarrhea, nausea and vomiting.  Neurological: Positive for weakness.  Psychiatric/Behavioral: Positive for depression and substance abuse. The patient is nervous/anxious.      Current treatment- S/p 3 cycles Keytruda  Allergies  Allergen Reactions   Ketamine Anxiety and Other (See Comments)    Syncope episode/confusion      Past Medical History:  Diagnosis Date   Abdominal pain 06/10/2018   Abnormal cervical Papanicolaou smear 09/18/2017   Anxiety    Aortic atherosclerosis  (HCC)    Arthritis    neck and knees   Blood clots in brain    both lungs and right kidney   Blood transfusion without reported diagnosis    Cervical cancer ([place]) 09/2016   mets lung   Chronic anal fissure    Chronic diarrhea    Dyspnea    Erosive gastropathy 09/18/2017   Factor V Leiden mutation ([place])    Fecal incontinence    Genital warts    GERD (gastroesophageal reflux disease)    GI bleed 10/08/2018   Heart murmur    Hematochezia    Hemorrhoids    Hepatitis C    Chronic, 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   Intestinal infection due to Clostridium difficile 09/18/2017   Macrocytic anemia with vitamin B12 deficiency    Multiple gastric ulcers    Nausea vomiting and diarrhea    Pancolitis ([place]) 07/27/2018   Perianal condylomata    Pneumonia    History of   Pulmonary nodules    Rectal bleeding    Small bowel obstruction ([place]) 08/2017   Stiff neck    limited right turn   Vitamin D deficiency      Past Surgical History:  Procedure Laterality Date  CHOLECYSTECTOMY     COLON SURGERY  08/2017   resection   COLONOSCOPY WITH PROPOFOL N/A 12/20/2017   Procedure: COLONOSCOPY WITH PROPOFOL;  Surgeon: [name], MD;  Location: [place];  Service: Gastroenterology;  Laterality: N/A;   COLONOSCOPY WITH PROPOFOL N/A 07/30/2018   Procedure: COLONOSCOPY WITH PROPOFOL;  Surgeon: [name], MD;  Location[facility] ENDOSCOPY;  Service: Gastroenterology;  Laterality: N/A;   COLONOSCOPY WITH PROPOFOL N/A 10/10/2018   Procedure: COLONOSCOPY WITH PROPOFOL;  Surgeon: Lucilla Lame, MD;  Location[facility] ENDOSCOPY;  Service: Endoscopy;  Laterality: N/A;    DIAGNOSTIC LAPAROSCOPY     ESOPHAGOGASTRODUODENOSCOPY (EGD) WITH PROPOFOL N/A 12/20/2017   Procedure: ESOPHAGOGASTRODUODENOSCOPY (EGD) WITH PROPOFOL;  Surgeon: [name], MD;  Location: [facility] ENDOSCOPY;  Service: Gastroenterology;  Laterality: N/A;   ESOPHAGOGASTRODUODENOSCOPY (EGD) WITH PROPOFOL  07/30/2018   Procedure: ESOPHAGOGASTRODUODENOSCOPY (EGD) WITH PROPOFOL;  Surgeon: [name], MD;  Location: [facility] ENDOSCOPY;  Service: Gastroenterology;;   [place] N/A 11/21/2018   Procedure: FLEXIBLE SIGMOIDOSCOPY;  Surgeon: [name], MD;  Location: [facility] ENDOSCOPY;  Service: Gastroenterology;  Laterality: N/A;   LAPAROTOMY N/A 08/31/2017   Procedure: EXPLORATORY LAPAROTOMY for SBO, ileocolectomy, removal of piece of uterine wall;  Surgeon: [name], MD;  Location: [facility] ORS;  Service: General;  Laterality: N/A;   LASER ABLATION CONDOLAMATA N/A 02/22/2018   Procedure: LASER ABLATION/REMOVAL OF HALPFXTKWIO AROUND ANUS AND VAGINA;  Surgeon: Michael Boston, MD;  Location: [place];  Service: General;  Laterality: N/A;   OOPHORECTOMY     PORTA CATH INSERTION N/A 05/13/2018   Procedure: PORTA CATH INSERTION;  Surgeon: Katha Cabal, MD;  Location: [facility] CV LAB;  Service: Cardiovascular;  Laterality: N/A;   SMALL INTESTINE SURGERY     TANDEM RING INSERTION     x3   THORACOTOMY     TOTAL KNEE ARTHROPLASTY Left 04/24/2018   Procedure: TOTAL KNEE ARTHROPLASTY;  Surgeon: Lovell Sheehan, MD;  Location: [facility] ORS;  Service: Orthopedics;  Laterality: Left;    Social History   Socioeconomic History   Marital status: Divorced    Spouse name: Not on file   Number of children: Not on file   Years of education: Not on file   Highest education level: Not on file  Occupational History   Not on file  Social Needs   Financial resource strain: Not hard at all   Food insecurity    Worry: Never true    Inability: Never true     Transportation needs    Medical: No    Non-medical: No  Tobacco Use   Smoking status: Former Smoker    Quit date: 10/16/2006    Years since quitting: 12.7   Smokeless tobacco: Never Used  Substance and Sexual Activity   Alcohol use: Not Currently    Frequency: Never    Comment: seldom   Drug use: Yes    Types: Marijuana    Comment: not very often   Sexual activity: Not Currently    Birth control/protection: Post-menopausal    Comment: Not Asked  Lifestyle   Physical activity    Days per week: Patient refused    Minutes per session: Patient refused   Stress: Only a little  Relationships   Social connections    Talks on phone: Patient refused    Gets together: Patient refused    Attends religious service: Patient refused    Active member of club or organization: Patient refused  Attends meetings of clubs or organizations: Patient refused    Relationship status: Patient refused   Intimate partner violence    Fear of current or ex partner: No    Emotionally abused: No    Physically abused: No    Forced sexual activity: No  Other Topics Concern   Not on file  Social History Narrative   Not on file    Family History  Problem Relation Age of Onset   Hypertension Father    Diabetes Father    Alcohol abuse Daughter    Hypertension Maternal Grandmother    Diabetes Maternal Grandmother    Diabetes Paternal Grandmother    Hypertension Paternal Grandmother      Current Outpatient Medications:    amitriptyline (ELAVIL) 75 MG tablet, Take 1 tablet (75 mg total) by mouth at bedtime., Disp: 90 tablet, Rfl: 1   Calcium Carb-Cholecalciferol (CALCIUM 500 +D) 500-400 MG-UNIT TABS, Take 2 tablets by mouth daily., Disp: , Rfl:    diphenoxylate-atropine (LOMOTIL) 2.5-0.025 MG tablet, TAKE (2) TABLETS FOUR TIMES DAILY., Disp: 240 tablet, Rfl: 0   famotidine (PEPCID) 20 MG tablet, Take 1 tablet (20 mg total) by mouth 2 (two) times daily for 5 days., Disp: 10  tablet, Rfl: 0   feeding supplement, ENSURE ENLIVE, (ENSURE ENLIVE) LIQD, Take 237 mLs by mouth 3 (three) times daily between meals., Disp: 237 mL, Rfl: 12   hydrocortisone (CORTEF) 20 MG tablet, Take 1 tablet (20 mg total) by mouth daily., Disp: 30 tablet, Rfl: 0   hydrOXYzine (ATARAX/VISTARIL) 10 MG tablet, Take 1 tablet (10 mg total) by mouth 3 (three) times daily as needed., Disp: 60 tablet, Rfl: 0   hyoscyamine (LEVBID) 0.375 MG 12 hr tablet, Take 1 tablet (0.375 mg total)by mouth 2 (two) times daily., Disp: 60 tablet, Rfl: 0   Multiple Vitamins-Minerals (MULTIVITAMIN WITH MINERALS) tablet, Take 1 tablet by mouth daily., Disp: 30 tablet, Rfl: 0   ondansetron (ZOFRAN ODT) 4 MG disintegrating tablet, Take 1 tablet (4 mg total) by mouth every 8 (eight) hours as needed for nausea or vomiting., Disp: 30 tablet, Rfl: 2   oxyCODONE (OXYCONTIN) 15 mg 12 hr tablet, Take 1 tablet (15 mg total) by mouth every 12 (twelve) hours., Disp: 60 tablet, Rfl: 0   Oxycodone HCl 10 MG TABS, Take 1 tablet (10 mg total) by mouth every 6 (six) hours as needed (for breakthrough pain)., Disp: 120 tablet, Rfl: 0   pantoprazole (PROTONIX) 40 MG tablet, Take 1 tablet (40 mg total) by mouth daily., Disp: 90 tablet, Rfl: 1   potassium chloride SA (K-DUR) 20 MEQ tablet, Take 1 tablet (20 mEq total) by mouth daily as needed (if having nausea/vomiting- to supplements for lost potassium)., Disp: 30 tablet, Rfl: 0   promethazine (PHENERGAN) 25 MG tablet, Take 1 tablet (25 mg total) by mouth every 8 (eight) hours as needed for nausea or vomiting., Disp: 30 tablet, Rfl: 3   rivaroxaban (XARELTO) 20 MG TABS tablet, Take 1 tablet (20 mg total) by mouth daily with supper. (Patient taking differently: Take 20 mg by mouth daily with supper. Takes in the morning), Disp: 30 tablet, Rfl: 6   VENTOLIN HFA 108 (90 Base) MCG/ACT inhaler, Inhale 1-2 puffs into the lungs every 4 (four) hours as needed for shortness of breath., Disp: 1  Inhaler, Rfl: 1 No current facility-administered medications for this visit.   Facility-Administered Medications Ordered in Other Visits:    heparin lock flush 100 unit/mL, 500 Units, Intravenous, Once, [place], [employer]  C, MD   sodium chloride 0.9 % 1,000 mL with potassium chloride 20 mEq infusion, , Intravenous, Once, Honor Loh E, NP   sodium chloride flush (NS) 0.9 % injection 10 mL, 10 mL, Intravenous, Once, [place], Kirt Boys, NP  Physical exam: There were no vitals filed for this visit. Physical Exam Constitutional:      Appearance: Normal appearance.  Cardiovascular:     Rate and Rhythm: Normal rate.  Pulmonary:     Effort: Pulmonary effort is normal.  Abdominal:     General: Bowel sounds are normal.     Palpations: Abdomen is soft.  Musculoskeletal: Normal range of motion.  Skin:    General: Skin is warm and dry.  Neurological:     Mental Status: She is alert.      CMP Latest Ref Rng & Units 06/30/2019  Glucose 70 - 99 mg/dL 156(H)  BUN 6 - 20 mg/dL 10  Creatinine 0.44 - 1.00 mg/dL 0.80  Sodium 135 - 145 mmol/L 138  Potassium 3.5 - 5.1 mmol/L 3.7  Chloride 98 - 111 mmol/L 102  CO2 22 - 32 mmol/L 25  Calcium 8.9 - 10.3 mg/dL 9.3  Total Protein 6.5 - 8.1 g/dL 7.2  Total Bilirubin 0.3 - 1.2 mg/dL 0.4  Alkaline Phos 38 - 126 U/L 118  AST 15 - 41 U/L 50(H)  ALT 0 - 44 U/L 76(H)   CBC Latest Ref Rng & Units 06/30/2019  WBC 4.0 - 10.5 K/uL 6.4  Hemoglobin 12.0 - 15.0 g/dL 12.7  Hematocrit 36.0 - 46.0 % 38.3  Platelets 150 - 400 K/uL 189    No images are attached to the encounter.  Dg Abd 1 View  Result Date: 06/24/2019 CLINICAL DATA:  53 year old female with evidence of small bowel obstruction on CT several days ago. Metastatic cervical carcinoma. EXAM: ABDOMEN - 1 VIEW COMPARISON:  06/23/2019 and earlier. FINDINGS: Portable supine views at 0941 hours. Enteric tube has been removed. There is only trace residual oral contrast in the large bowel, at the splenic  flexure and descending colon. Cholecystectomy clips and right abdominal staple line redemonstrated. Non obstructed bowel gas pattern. Partially visible right chest port. Bilateral lung pulmonary nodules were better demonstrated by CT. Normal visible mediastinal contours. Degenerative changes in the spine. No acute osseous abnormality identified. IMPRESSION: 1. Continued non obstructed bowel gas pattern since yesterday. Minimal residual bowel contrast. Enteric tube removed. 2. Pulmonary metastases as demonstrated by CT. Electronically Signed   By: Genevie Ann M.D.   On: 06/24/2019 10:00   Dg Abd 1 View  Result Date: 06/22/2019 CLINICAL DATA:  NG tube placement. EXAM: ABDOMEN - 1 VIEW COMPARISON:  06/21/2019 FINDINGS: Nasogastric tube tip overlies the level of the mid stomach. Bowel gas pattern is nonobstructed. Numerous pulmonary nodules are again identified at the lung bases. IMPRESSION: Nasogastric tube tip to the level of the mid stomach. Electronically Signed   By: Nolon Nations M.D.   On: 06/22/2019 14:52   Ct Head Wo Contrast  Result Date: 06/07/2019 CLINICAL DATA:  Headache, history of metastatic cervical cancer EXAM: CT HEAD WITHOUT CONTRAST TECHNIQUE: Contiguous axial images were obtained from the base of the skull through the vertex without intravenous contrast. COMPARISON:  05/18/2019 FINDINGS: Brain: No evidence of acute infarction, hemorrhage, hydrocephalus, extra-axial collection or mass lesion/mass effect. Vascular: No hyperdense vessel or unexpected calcification. Skull: Normal. Negative for fracture or focal lesion. Sinuses/Orbits: The visualized paranasal sinuses are essentially clear. The mastoid air cells are unopacified. Other: None.  IMPRESSION: Normal head CT. Electronically Signed   By: Julian Hy M.D.   On: 06/07/2019 11:04   Ct Abdomen Pelvis W Contrast  Result Date: 06/21/2019 CLINICAL DATA:  Onset diarrhea and epigastric pain yesterday. History of metastatic cervical  carcinoma. EXAM: CT ABDOMEN AND PELVIS WITH CONTRAST TECHNIQUE: Multidetector CT imaging of the abdomen and pelvis was performed using the standard protocol following bolus administration of intravenous contrast. CONTRAST:  100 mL OMNIPAQUE IOHEXOL 300 MG/ML  SOLN COMPARISON:  CT abdomen and pelvis 06/07/2019 and CT chest, abdomen and pelvis 04/21/2019. FINDINGS: Lower chest: No pleural or pericardial effusion. Multiple bilateral lower lobe pulmonary nodules consistent with metastatic carcinoma are unchanged. Hepatobiliary: No focal liver lesion. The patient is status post cholecystectomy. Mild intra and extrahepatic biliary ductal prominence is unchanged. Pancreas: Unremarkable. No pancreatic ductal dilatation or surrounding inflammatory changes. Spleen: Normal in size without focal abnormality. Adrenals/Urinary Tract: Single punctate nonobstructing stones in the kidneys are unchanged. No hydronephrosis or ureteral stone. Urinary bladder is unremarkable. Adrenal glands appear normal. Stomach/Bowel: The patient is status post right hemicolectomy. The anastomosis is normal in appearance. There is fecalization of small bowel contents in loops in the pelvis consistent with slow transit, new since the prior exams. A transition point is seen in the right lower quadrant of the abdomen on images 65-68 where more distal small bowel loops are completely decompressed. No pneumatosis, portal venous gas or free intraperitoneal air. The sigmoid colon is incompletely distended with thickening of the walls, unchanged. The stomach is unremarkable. Vascular/Lymphatic: Aortic atherosclerosis. No enlarged abdominal or pelvic lymph nodes. Reproductive: Status post hysterectomy. No adnexal masses. Other: None. Musculoskeletal: No acute or focal abnormality. Convex left scoliosis and multilevel degenerative change noted. IMPRESSION: The examination is positive for small bowel obstruction which appears to be due to adhesions with a  transition point in the right lower quadrant. No CT signs of bowel ischemia. No change in multiple pulmonary nodules consistent with metastatic disease. No change in chronic wall thickening of the sigmoid colon compatible chronic colitis likely secondary to prior radiation therapy. Electronically Signed   By: Inge Rise M.D.   On: 06/21/2019 10:36   Ct Abdomen Pelvis W Contrast  Result Date: 06/07/2019 CLINICAL DATA:  Metastatic cervical cancer, nausea, vomiting and diarrhea EXAM: CT ABDOMEN AND PELVIS WITH CONTRAST TECHNIQUE: Multidetector CT imaging of the abdomen and pelvis was performed using the standard protocol following bolus administration of intravenous contrast. CONTRAST:  132m OMNIPAQUE IOHEXOL 300 MG/ML  SOLN COMPARISON:  04/21/2019 FINDINGS: Lower chest: Continue progression of lower chest numerous bilateral pulmonary metastases. These have increased in size and number. Some are cavitary. No pleural effusion. No superimposed lower lobe consolidation or collapse. Normal heart size. No pericardial effusion. Hepatobiliary: Stable intra and extrahepatic biliary dilatation following cholecystectomy. No focal hepatic abnormality. Pancreas: Unremarkable. No pancreatic ductal dilatation or surrounding inflammatory changes. Spleen: Normal in size without focal abnormality. Adrenals/Urinary Tract: Adrenal glands are unremarkable. Kidneys are normal, focal lesion, or hydronephrosis. Bladder is unremarkable. Punctate nonobstructing subcentimeter bilateral intrarenal calculi. Stomach/Bowel: Negative for bowel obstruction, significant dilatation, ileus, or free air. Patient appears status post right colectomy. Distal colon demonstrates mild wall thickening of the descending colon, sigmoid rectum mucosal enhancement, similar to the prior study which may represent colitis. This could be related to prior pelvic radiation. No fluid collection, hemorrhage, hematoma, abscess, or ascites. Vascular/Lymphatic:  Abdominal atherosclerosis without aneurysm occlusive process. No veno-occlusive disease. Mesenteric and renal vasculature appear patent. No bulky adenopathy. Reproductive: Atrophic uterus.  No adnexal abnormality. No pelvic free fluid, fluid collection, or abscess. Other: Intact abdominal wall.  No hernia. Musculoskeletal: Osteopenia and degenerative change with associated scoliosis. No acute osseous finding. IMPRESSION: Continue progression of pulmonary metastases. Stable biliary dilatation following cholecystectomy Similar distal colonic wall thickening and mucosal enhancement predominately of the sigmoid and rectum compatible with colitis, possibly secondary to radiation. Abdominal atherosclerosis without aneurysm Negative for bowel obstruction, free, or abscess. Nonobstructing nephrolithiasis. Electronically Signed   By: Jerilynn Mages.  Shick M.D.   On: 06/07/2019 12:32   Dg Abd Portable 1v-small Bowel Obstruction Protocol-initial, 8 Hr Delay  Result Date: 06/23/2019 CLINICAL DATA:  Initial evaluation for SBO, 8 hours status post contrast administration. EXAM: PORTABLE ABDOMEN - 1 VIEW COMPARISON:  Prior radiograph from 06/22/2019. FINDINGS: Enteric tube in place with tip in side hole overlying the stomach, beyond the GE junction. Enteric contrast material seen diffusely throughout the colon to the level of the distal sigmoid. Some contrast is seen within the distal ileum within the right lower quadrant. No significant small bowel dilatation identified. No soft tissue mass or abnormal calcification. Cholecystectomy clips noted. No visible free air. Visualized lung bases are clear. Lumbar levoscoliosis with moderate multilevel degenerative spondylolysis noted. IMPRESSION: 1. Enteric contrast material diffusely throughout the colon to the level of the distal sigmoid, with few scattered nondilated contrast filled loops of distal small bowel within the right lower quadrant. No significant small bowel dilatation identified. 2.  Enteric tube in place with tip and side hole overlying the stomach, beyond the GE junction Electronically Signed   By: Jeannine Boga M.D.   On: 06/23/2019 00:54   Dg Abd Portable 1 View  Result Date: 06/21/2019 CLINICAL DATA:  NG tube placement. History of cervical and lung cancer. EXAM: PORTABLE ABDOMEN - 1 VIEW COMPARISON:  CT abdomen pelvis-06/21/2019 FINDINGS: Enteric tube tip and side port project over the mid body of the stomach. No pneumoperitoneum, pneumatosis or portal venous gas. Small amount of excreted contrast is seen within the bilateral renal collecting systems as a sequela of recent intravenous contrasted examination. Limited visualization of lower thorax demonstrates scattered bilateral pulmonary nodules compatible with known metastatic disease. Port a catheter tip projects over the superior cavoatrial junction. Post cholecystectomy. Mild-to-moderate scoliotic curvature of the thoracolumbar spine. IMPRESSION: Enteric tube tip and side port project over the mid body of the stomach. Electronically Signed   By: Sandi Mariscal M.D.   On: 06/21/2019 13:30   Dg Hip Unilat W Or Wo Pelvis 2-3 Views Right  Result Date: 06/07/2019 CLINICAL DATA:  Right hip pain for 1 week. No reported injury. History of pseudogout. EXAM: DG HIP (WITH OR WITHOUT PELVIS) 2-3V RIGHT COMPARISON:  04/21/2027 CT abdomen/pelvis. FINDINGS: There is no evidence of hip fracture or dislocation. There is no evidence of significant arthropathy or other focal bone abnormality. Degenerative changes in the visualized lower lumbar spine. IMPRESSION: No acute osseous abnormality. Electronically Signed   By: Ilona Sorrel M.D.   On: 06/07/2019 11:58   Assessment and plan- Patient is a 53 y.o. female who presents to symptom management for complaints of intractable nausea and vomiting and persistent diarrhea.  Symptoms unrelieved with current medications.  Cervical cancer: Previously treated with single agent Taxol  (06/04/2018-04/10/19) with multiple interruptions due to hospital admissions.  Recently switched to [facility].  She is status post 3 cycles last given on 06/19/2019.  She has been admitted 3 times since beginning Keytruda for acute gastroenteritis x2 and a small bowel obstruction.  Plan is  for her to return to clinic next week for lab work and consideration of cycle 4.   Intractable nausea and vomiting: Chronic problem.  Waxes and wanes.  Encouraged her to take prescriptive antiemetics around-the-clock.  Will review labs.  Diarrhea: Patient admits to taking all of her medications as prescribed but not consistently.  We reviewed in detail the appropriate amounts she is able to take of age.  States she will try to take them more consistently.   Plan: Reviewed labs. (Hypomagnesemia; 1.5) Give 1 L IV fluids Give 2 g IV mag Give 12.5 mg IV Phenergan Refilled pain medications.  Disposition: RTC as scheduled to see Dr. Rogue Bussing next week for consideration of cycle 4 Keytruda. RTC as needed to symptom management for electrolyte replacement.  Visit Diagnosis No diagnosis found.  Patient expressed understanding and was in agreement with this plan. She also understands that She can call clinic at any time with any questions, concerns, or complaints.   Greater than 50% was spent in counseling and coordination of care with this patient including but not limited to discussion of the relevant topics above (See A&P) including, but not limited to diagnosis and management of acute and chronic medical conditions.   Thank you for allowing me to participate in the care of this very pleasant patient.    Jacquelin Hawking, NP [place] [facility] Cell - [phone] Pager- [phone] 06/30/2019 3:48 PM

## 2019-07-01 ENCOUNTER — Inpatient Hospital Stay: Payer: Medicaid Other | Admitting: Surgery

## 2019-07-01 ENCOUNTER — Inpatient Hospital Stay: Payer: Medicaid Other | Admitting: Family Medicine

## 2019-07-02 ENCOUNTER — Encounter: Payer: Self-pay | Admitting: Gastroenterology

## 2019-07-02 ENCOUNTER — Inpatient Hospital Stay: Payer: Medicaid Other

## 2019-07-03 ENCOUNTER — Encounter: Payer: Self-pay | Admitting: Oncology

## 2019-07-03 ENCOUNTER — Other Ambulatory Visit: Payer: Self-pay

## 2019-07-03 ENCOUNTER — Inpatient Hospital Stay: Payer: Medicaid Other

## 2019-07-03 DIAGNOSIS — Z5112 Encounter for antineoplastic immunotherapy: Secondary | ICD-10-CM | POA: Diagnosis not present

## 2019-07-03 DIAGNOSIS — R110 Nausea: Secondary | ICD-10-CM

## 2019-07-03 DIAGNOSIS — Z95828 Presence of other vascular implants and grafts: Secondary | ICD-10-CM

## 2019-07-03 DIAGNOSIS — C538 Malignant neoplasm of overlapping sites of cervix uteri: Secondary | ICD-10-CM

## 2019-07-03 LAB — CBC WITH DIFFERENTIAL/PLATELET
Abs Immature Granulocytes: 0.02 10*3/uL (ref 0.00–0.07)
Basophils Absolute: 0 10*3/uL (ref 0.0–0.1)
Basophils Relative: 0 %
Eosinophils Absolute: 0 10*3/uL (ref 0.0–0.5)
Eosinophils Relative: 1 %
HCT: 33.3 % — ABNORMAL LOW (ref 36.0–46.0)
Hemoglobin: 10.9 g/dL — ABNORMAL LOW (ref 12.0–15.0)
Immature Granulocytes: 1 %
Lymphocytes Relative: 21 %
Lymphs Abs: 0.8 10*3/uL (ref 0.7–4.0)
MCH: 33.3 pg (ref 26.0–34.0)
MCHC: 32.7 g/dL (ref 30.0–36.0)
MCV: 101.8 fL — ABNORMAL HIGH (ref 80.0–100.0)
Monocytes Absolute: 0.2 10*3/uL (ref 0.1–1.0)
Monocytes Relative: 6 %
Neutro Abs: 2.6 10*3/uL (ref 1.7–7.7)
Neutrophils Relative %: 71 %
Platelets: 160 10*3/uL (ref 150–400)
RBC: 3.27 MIL/uL — ABNORMAL LOW (ref 3.87–5.11)
RDW: 14 % (ref 11.5–15.5)
WBC: 3.7 10*3/uL — ABNORMAL LOW (ref 4.0–10.5)
nRBC: 0 % (ref 0.0–0.2)

## 2019-07-03 LAB — COMPREHENSIVE METABOLIC PANEL
ALT: 57 U/L — ABNORMAL HIGH (ref 0–44)
AST: 40 U/L (ref 15–41)
Albumin: 3.3 g/dL — ABNORMAL LOW (ref 3.5–5.0)
Alkaline Phosphatase: 131 U/L — ABNORMAL HIGH (ref 38–126)
Anion gap: 7 (ref 5–15)
BUN: 13 mg/dL (ref 6–20)
CO2: 26 mmol/L (ref 22–32)
Calcium: 8.6 mg/dL — ABNORMAL LOW (ref 8.9–10.3)
Chloride: 105 mmol/L (ref 98–111)
Creatinine, Ser: 0.78 mg/dL (ref 0.44–1.00)
GFR calc Af Amer: >60 mL/min (ref >60)
GFR calc non Af Amer: >60 mL/min (ref >60)
Glucose, Bld: 139 mg/dL — ABNORMAL HIGH (ref 70–99)
Potassium: 4 mmol/L (ref 3.5–5.1)
Sodium: 138 mmol/L (ref 135–145)
Total Bilirubin: 0.5 mg/dL (ref 0.3–1.2)
Total Protein: 6.5 g/dL (ref 6.5–8.1)

## 2019-07-03 LAB — MAGNESIUM: Magnesium: 1.5 mg/dL — ABNORMAL LOW (ref 1.7–2.4)

## 2019-07-03 MED ORDER — HEPARIN SOD (PORK) LOCK FLUSH 100 UNIT/ML IV SOLN
500.0000 [IU] | Freq: Once | INTRAVENOUS | Status: AC | PRN
  Administered 2019-07-03: 500 [IU]
  Filled 2019-07-03: qty 5

## 2019-07-03 MED ORDER — MAGNESIUM SULFATE 4 GM/100ML IV SOLN
4.0000 g | Freq: Once | INTRAVENOUS | Status: AC
  Administered 2019-07-03: 4 g via INTRAVENOUS
  Filled 2019-07-03: qty 100

## 2019-07-03 MED ORDER — ONDANSETRON HCL 4 MG/2ML IJ SOLN
8.0000 mg | Freq: Once | INTRAMUSCULAR | Status: AC
  Administered 2019-07-03: 8 mg via INTRAVENOUS
  Filled 2019-07-03: qty 4

## 2019-07-03 MED ORDER — SODIUM CHLORIDE 0.9% FLUSH
10.0000 mL | Freq: Once | INTRAVENOUS | Status: AC
  Administered 2019-07-03: 10 mL via INTRAVENOUS
  Filled 2019-07-03: qty 10

## 2019-07-03 MED ORDER — SODIUM CHLORIDE 0.9 % IV SOLN: Freq: Once | INTRAVENOUS | Status: DC

## 2019-07-03 MED ORDER — PROMETHAZINE HCL 25 MG/ML IJ SOLN
12.5000 mg | Freq: Four times a day (QID) | INTRAMUSCULAR | Status: DC | PRN
  Administered 2019-07-03: 12.5 mg via INTRAVENOUS
  Filled 2019-07-03: qty 1

## 2019-07-03 MED ORDER — SODIUM CHLORIDE 0.9 % IV SOLN
Freq: Once | INTRAVENOUS | Status: AC
  Administered 2019-07-03: 10:00:00 via INTRAVENOUS
  Filled 2019-07-03: qty 250

## 2019-07-04 ENCOUNTER — Encounter: Payer: Self-pay | Admitting: Oncology

## 2019-07-04 ENCOUNTER — Telehealth: Payer: Self-pay | Admitting: *Deleted

## 2019-07-04 ENCOUNTER — Inpatient Hospital Stay: Payer: Medicaid Other | Admitting: Surgery

## 2019-07-04 ENCOUNTER — Other Ambulatory Visit: Payer: Self-pay | Admitting: *Deleted

## 2019-07-04 NOTE — Telephone Encounter (Signed)
Duplicate

## 2019-07-04 NOTE — Telephone Encounter (Signed)
Too early for refill

## 2019-07-07 ENCOUNTER — Other Ambulatory Visit: Payer: Self-pay | Admitting: Oncology

## 2019-07-07 NOTE — Telephone Encounter (Signed)
FYI

## 2019-07-07 NOTE — Telephone Encounter (Signed)
Your patient. Thanks, Astrid Divine

## 2019-07-08 ENCOUNTER — Other Ambulatory Visit: Payer: Self-pay | Admitting: *Deleted

## 2019-07-08 ENCOUNTER — Encounter: Payer: Self-pay | Admitting: Internal Medicine

## 2019-07-08 DIAGNOSIS — R110 Nausea: Secondary | ICD-10-CM

## 2019-07-08 MED ORDER — ONDANSETRON 4 MG PO TBDP: 4.0000 mg | ORAL_TABLET | Freq: Three times a day (TID) | ORAL | 2 refills | Status: DC | PRN

## 2019-07-10 ENCOUNTER — Inpatient Hospital Stay: Payer: Medicaid Other

## 2019-07-10 ENCOUNTER — Other Ambulatory Visit: Payer: Self-pay

## 2019-07-10 ENCOUNTER — Encounter: Payer: Self-pay | Admitting: Internal Medicine

## 2019-07-10 ENCOUNTER — Inpatient Hospital Stay (HOSPITAL_BASED_OUTPATIENT_CLINIC_OR_DEPARTMENT_OTHER): Payer: Medicaid Other | Admitting: Internal Medicine

## 2019-07-10 VITALS — BP 128/91 | HR 94 | Temp 94.0°F | Resp 16 | Wt 163.0 lb

## 2019-07-10 DIAGNOSIS — C538 Malignant neoplasm of overlapping sites of cervix uteri: Secondary | ICD-10-CM

## 2019-07-10 DIAGNOSIS — Z7189 Other specified counseling: Secondary | ICD-10-CM

## 2019-07-10 DIAGNOSIS — C7800 Secondary malignant neoplasm of unspecified lung: Secondary | ICD-10-CM | POA: Diagnosis not present

## 2019-07-10 DIAGNOSIS — Z5112 Encounter for antineoplastic immunotherapy: Secondary | ICD-10-CM | POA: Diagnosis not present

## 2019-07-10 LAB — COMPREHENSIVE METABOLIC PANEL
ALT: 103 U/L — ABNORMAL HIGH (ref 0–44)
AST: 44 U/L — ABNORMAL HIGH (ref 15–41)
Albumin: 3.6 g/dL (ref 3.5–5.0)
Alkaline Phosphatase: 215 U/L — ABNORMAL HIGH (ref 38–126)
Anion gap: 7 (ref 5–15)
BUN: 12 mg/dL (ref 6–20)
CO2: 27 mmol/L (ref 22–32)
Calcium: 9.2 mg/dL (ref 8.9–10.3)
Chloride: 102 mmol/L (ref 98–111)
Creatinine, Ser: 0.78 mg/dL (ref 0.44–1.00)
GFR calc Af Amer: >60 mL/min (ref >60)
GFR calc non Af Amer: >60 mL/min (ref >60)
Glucose, Bld: 92 mg/dL (ref 70–99)
Potassium: 3.8 mmol/L (ref 3.5–5.1)
Sodium: 136 mmol/L (ref 135–145)
Total Bilirubin: 0.4 mg/dL (ref 0.3–1.2)
Total Protein: 7.1 g/dL (ref 6.5–8.1)

## 2019-07-10 LAB — CBC WITH DIFFERENTIAL/PLATELET
Abs Immature Granulocytes: 0.02 10*3/uL (ref 0.00–0.07)
Basophils Absolute: 0 10*3/uL (ref 0.0–0.1)
Basophils Relative: 0 %
Eosinophils Absolute: 0 10*3/uL (ref 0.0–0.5)
Eosinophils Relative: 1 %
HCT: 37.7 % (ref 36.0–46.0)
Hemoglobin: 12.4 g/dL (ref 12.0–15.0)
Immature Granulocytes: 1 %
Lymphocytes Relative: 30 %
Lymphs Abs: 1.2 10*3/uL (ref 0.7–4.0)
MCH: 33.1 pg (ref 26.0–34.0)
MCHC: 32.9 g/dL (ref 30.0–36.0)
MCV: 100.5 fL — ABNORMAL HIGH (ref 80.0–100.0)
Monocytes Absolute: 0.2 10*3/uL (ref 0.1–1.0)
Monocytes Relative: 6 %
Neutro Abs: 2.4 10*3/uL (ref 1.7–7.7)
Neutrophils Relative %: 62 %
Platelets: 165 10*3/uL (ref 150–400)
RBC: 3.75 MIL/uL — ABNORMAL LOW (ref 3.87–5.11)
RDW: 13.8 % (ref 11.5–15.5)
WBC: 3.8 10*3/uL — ABNORMAL LOW (ref 4.0–10.5)
nRBC: 0 % (ref 0.0–0.2)

## 2019-07-10 LAB — MAGNESIUM: Magnesium: 1.5 mg/dL — ABNORMAL LOW (ref 1.7–2.4)

## 2019-07-10 MED ORDER — ONDANSETRON HCL 4 MG/2ML IJ SOLN
8.0000 mg | Freq: Once | INTRAMUSCULAR | Status: AC
  Administered 2019-07-10: 8 mg via INTRAVENOUS
  Filled 2019-07-10: qty 4

## 2019-07-10 MED ORDER — SODIUM CHLORIDE 0.9 % IV SOLN
200.0000 mg | Freq: Once | INTRAVENOUS | Status: AC
  Administered 2019-07-10: 200 mg via INTRAVENOUS
  Filled 2019-07-10: qty 8

## 2019-07-10 MED ORDER — HEPARIN SOD (PORK) LOCK FLUSH 100 UNIT/ML IV SOLN
500.0000 [IU] | Freq: Once | INTRAVENOUS | Status: AC
  Administered 2019-07-10: 500 [IU] via INTRAVENOUS
  Filled 2019-07-10: qty 5

## 2019-07-10 MED ORDER — SODIUM CHLORIDE 0.9% FLUSH
10.0000 mL | Freq: Once | INTRAVENOUS | Status: DC
  Filled 2019-07-10: qty 10

## 2019-07-10 MED ORDER — SODIUM CHLORIDE 0.9 % IV SOLN
Freq: Once | INTRAVENOUS | Status: AC
  Administered 2019-07-10: 10:00:00 via INTRAVENOUS
  Filled 2019-07-10: qty 250

## 2019-07-10 MED ORDER — MAGNESIUM SULFATE 4 GM/100ML IV SOLN
4.0000 g | Freq: Once | INTRAVENOUS | Status: AC
  Administered 2019-07-10: 4 g via INTRAVENOUS
  Filled 2019-07-10: qty 100

## 2019-07-10 MED ORDER — HEPARIN SOD (PORK) LOCK FLUSH 100 UNIT/ML IV SOLN: 500.0000 [IU] | Freq: Once | INTRAVENOUS | Status: DC | PRN

## 2019-07-10 NOTE — Progress Notes (Signed)
[facility] NOTE  Patient Care Team: Lada, Satira Anis, MD as PCP - General (Family Medicine) Mellody Drown, MD as Consulting Physician (Obstetrics and Gynecology) [name], MD as Consulting Physician (Gastroenterology) Michael Boston, MD as Consulting Physician (General Surgery) Lovell Sheehan, MD as Consulting Physician (Orthopedic Surgery) Cammie Sickle, MD as Consulting Physician (Oncology) Earnestine Leys, MD as Consulting Physician (Orthopedic Surgery)  CHIEF COMPLAINTS/PURPOSE OF CONSULTATION: Cervical cancer  #  Oncology History Overview Note  # dec 2017- CERVICAL ADENO CA [[place]]; Stage IB [Dr. Christene Slates at [facility] in [place], [place];No surgery- Chemo-RT;  # 2018-sep - lung nodules [in [place]]  # AUG 20th, 2019-#1 Carbo-Taxol s/p 6 cycles- [No avastin sec to blood clots]: Feb 2020-bilateral lung nodules with 1 cm in size. March 2020- finished carbo-taxol #7; poor tolerance to [place].  # April 15 th 2020- Start Taxol weekly x 3; one week OFF;  # July 2020-CT- bil cavitary lesions- slightly bigger by few mm/overall STABLE; CT a/p-NED. 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  She received treatment from 11/2016-05/2017.  01/2017 cisplatin x 2 and carboplatin x 1 (01/29/2017) due to ARF and XRT.  XRT was followed by T & O on 02/01/2017 and T & N 02/10/2017 and 02/20/2017.  Course was complicated by 80 pound weight loss, nausea, vomiting, electrolyte wasting (potassium and magnesium).  She  describes that.  Is been sick constantly requiring at least 20 hospitalizations.  Follow-up CT chest and PET on 06/2017. Per patient, 'radiation worked' and no disease in the abdomen.  At that time she was noted to have lung nodules that were growing and follow-up imaging was scheduled for 10/2017.  She was admitted to hospital in [place] for small bowel obstruction which was managed conservatively and she was home for a week prior to traveling to [place] for Thanksgiving holiday where she has family.  She presented to ER in [place] on 08/2017 with nausea, vomiting, and lower abdominal pain.  Symptoms did not respond to conservative treatment.   CT on 08/26/2017 revealed small bowel obstruction with transition in the pelvis just superior to the uterus rather was a long segment of distal ileum with fatty wall thickening compatible with chronic inflammation and/or radiation enteritis. Imaging showed numerous pulmonary nodules consistent with metastatic disease. She underwent laparotomy and right ileocolectomy on 08/31/2017 at [facility].  Surgical findings revealed a thickened, matted, and scarred piece of distal small bowel close to the ileocecal valve.  She was discharged on 09/05/2017.  Pain markedly increased in intensity and imaging was performed on 09/11/2017 which revealed: Debris within the anterior abdominal wall incision concerning for infection versus packing material, s/p post ileo-colectomy with expected postoperative changes, mild colonic ileus, numerous pulmonary nodules highly concerning for metastatic disease, punctate nonobstructing nephrolithiasis.  Staples were removed and one was packed.  She was started on doxycycline.  Abdominal and pelvic CT without contrast on 09/11/2017 revealed debris within anterior abdominal wall incision concerning for infection, versus packing material.   She is s/p ileocolectomy with expected postoperative changes and mild colonic ileus.  There  were numerous pulmonary nodules highly concerning for metastatic disease and punctate nonobstructing nephrolithiasis. She was readmitted on 09/12/2017.  She describes the onset of lower abdominal pain on 09/09/2017.  Pain markedly increased in intensity on 09/11/2017.   Staples were removed and the wound packed.  She was started on doxycycline.  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   PET scan was ordered to evaluate enlarging lung nodules with concern for recurrent cervical cancer but scan was delayed due to insurance and need to be performed in [place].  Presented to ER on 12/03/2017 for abdominal pain and emesis.  Imaging concerning for worsening possible uterine infarct and she was admitted to hospital.  Pelvic MRI was unremarkable.  Remote scarring type changes of uterus thought to be possibly related to radiation.  She was discharged on 12/08/2017.  Underwent endoscopy and colonoscopy on 12/20/2017.    On 02/22/2018 she underwent laser ablation of condylomata around the anus and vagina under anesthesia with Dr. Johney Maine.   04/15/2018:  Chest, abdomen, and pelvis CT revealed innumerable (> 100) cavitary nodules scattered in the lungs, moderately enlarging compared to the 11/08/2017 PET-CT, suspicious for metastatic disease.  One index node in the RLL measures 1.0 x 1.1 cm (previously 0.6 x 0.6 cm).  There were no new nodules.  There was an ill-defined wall thickening in the rectosigmoid with surrounding stranding along fascia planes, probably sequela from prior radiation therapy.  There was multilevel lumbar  impingement due to spondylosis and degenerative disc disease.  There was heterogeneous enhancement in the uterus (some possibly from prior radiation therapy).   04/23/2018:  PET scan revealed numerous scattered solid and cavitary nodules in the lungs stable increased in size compared to the prior PET-CT from 11/08/2017. Largest nodule was 1.1 cm in the LUL (SUV 1.9).  These demonstrated low-grade metabolic activity up to a maximum SUV of about 2.3, increased from 11/08/2017.    Case was discussed at tumor board on 04/25/2018. Consensus to pursue CT-guided biopsy (05/06/18) which revealed: Metastatic adenocarcinoma, morphologically consistent with cervical adenocarcinoma.  She has history of chronic hepatitis C which is managed by GI.  Hepatitis C genotype is 2a/2c.  She receives B12 injections for history of B12 deficiency.  On 04/24/2018 she underwent left total knee replacement with Dr. [name]. --------------------------------------------------------   DIAGNOSIS: Cervical adenocarcinoma  STAGE: IV        ;GOALS: Palliative  CURRENT/MOST RECENT THERAPY : Keytruda    Malignant neoplasm of overlapping sites of cervix ([place])  01/29/2019 - 04/23/2019 Chemotherapy   The patient had PACLitaxel (TAXOL) 156 mg in sodium chloride 0.9 % 250 mL chemo infusion (</= 7m/m2), 80 mg/m2 = 156 mg, Intravenous,  Once, 3 of 4 cycles Dose modification: 65 mg/m2 (original dose 80 mg/m2, Cycle 1, Reason: Provider Judgment) Administration: 156 mg (01/29/2019), 156 mg (02/05/2019), 126 mg (02/12/2019), 126 mg (02/26/2019), 126 mg (03/13/2019), 126 mg (03/27/2019), 126 mg (03/20/2019), 126 mg (04/03/2019), 126 mg (04/10/2019)  for chemotherapy treatment.    05/08/2019 -  Chemotherapy   The patient had pembrolizumab (KEYTRUDA) 200 mg in sodium chloride 0.9 % 50 mL chemo infusion, 200 mg, Intravenous, Once, 4 of  6 cycles Administration: 200 mg (05/08/2019), 200 mg (05/29/2019), 200 mg (06/19/2019), 200 mg (07/10/2019)  for chemotherapy  treatment.       HISTORY OF PRESENTING ILLNESS:  JORDANNE ELSBURY 53 y.o.  female with a history of adenocarcinoma the cervix with metastasis to the lung most recently started on Keytruda status post cycle #3 is here for follow-up.  In the interim patient was again up to the hospital for partial small bowel obstruction.  Patient's symptoms/partial small bowel obstruction improved with conservative measures.  She is currently back to baseline.  She continues to complain of joint pains shoulder pain.  Overall stable.  She continues to be on hydrocortisone.  Awaiting endocrine evaluation.  Chronic abdominal discomfort.  Chronic diarrhea.  Tingling and numbness.  Chronic fatigue.  Review of Systems  Constitutional: Positive for malaise/fatigue. Negative for chills, diaphoresis, fever and weight loss.  HENT: Negative for nosebleeds and sore throat.   Eyes: Negative for double vision.  Respiratory: Negative for cough, hemoptysis, sputum production, shortness of breath and wheezing.   Cardiovascular: Negative for chest pain, palpitations, orthopnea and leg swelling.  Gastrointestinal: Positive for abdominal pain and diarrhea. Negative for blood in stool, constipation, heartburn, melena, nausea and vomiting.  Genitourinary: Negative for dysuria, frequency and urgency.  Musculoskeletal: Positive for back pain and joint pain.  Skin: Negative.  Negative for itching and rash.  Neurological: Positive for tingling. Negative for dizziness, focal weakness, weakness and headaches.  Endo/Heme/Allergies: Does not bruise/bleed easily.  Psychiatric/Behavioral: Negative for depression. The patient is not nervous/anxious and does not have insomnia.      MEDICAL HISTORY:  Past Medical History:  Diagnosis Date  . Abdominal pain 06/10/2018  . Abnormal cervical Papanicolaou smear 09/18/2017  . Anxiety   . Aortic atherosclerosis ([place])   . Arthritis    neck and knees  . Blood clots in brain    both lungs  and right kidney  . Blood transfusion without reported diagnosis   . Cervical cancer ([place]) 09/2016   mets lung  . Chronic anal fissure   . Chronic diarrhea   . Dyspnea   . Erosive gastropathy 09/18/2017  . Factor V Leiden mutation ([place])   . Fecal incontinence   . Genital warts   . GERD (gastroesophageal reflux disease)   . GI bleed 10/08/2018  . Heart murmur   . Hematochezia   . Hemorrhoids   . 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 Hepatitis, chronic ([place]) 05/05/2017  . History of cancer chemotherapy    completed 06/2017  . History of Clostridium difficile infection    while undergoing chemo.  Negative test 10/2017  . Ileocolic anastomotic leak   . Infarction of kidney ( [place]) left kidney   and uterus  . Intestinal infection due to Clostridium difficile 09/18/2017  . Macrocytic anemia with vitamin B12 deficiency   . Multiple gastric ulcers   . Nausea vomiting and diarrhea   . Pancolitis ([place]) 07/27/2018  . Perianal condylomata   . Pneumonia    History of  . Pulmonary nodules   . Rectal bleeding   . Small bowel obstruction ([place]) 08/2017  . Stiff neck    limited right turn  . Vitamin D deficiency     SURGICAL HISTORY: Past Surgical History:  Procedure Laterality Date  . CHOLECYSTECTOMY    . COLON SURGERY  08/2017   resection  . COLONOSCOPY WITH PROPOFOL N/A 12/20/2017   Procedure: COLONOSCOPY WITH PROPOFOL;  Surgeon: [name], MD;  Location[facility] ENDOSCOPY;  Service: Gastroenterology;  Laterality: N/A;  . COLONOSCOPY WITH PROPOFOL N/A 07/30/2018   Procedure: COLONOSCOPY WITH PROPOFOL;  Surgeon: [name], MD;  Location: [facility] ENDOSCOPY;  Service: Gastroenterology;  Laterality: N/A;  . COLONOSCOPY WITH PROPOFOL N/A 10/10/2018   Procedure: COLONOSCOPY WITH PROPOFOL;  Surgeon: Lucilla Lame, MD;  Location: [facility] ENDOSCOPY;  Service: Endoscopy;  Laterality: N/A;  . DIAGNOSTIC LAPAROSCOPY    . ESOPHAGOGASTRODUODENOSCOPY (EGD) WITH  PROPOFOL N/A 12/20/2017   Procedure: ESOPHAGOGASTRODUODENOSCOPY (EGD) WITH PROPOFOL;  Surgeon: [name], MD;  Location: [place];  Service: Gastroenterology;  Laterality: N/A;  . ESOPHAGOGASTRODUODENOSCOPY (EGD) WITH PROPOFOL  07/30/2018   Procedure: ESOPHAGOGASTRODUODENOSCOPY (EGD) WITH PROPOFOL;  Surgeon: [name], MD;  Location: [facility] ENDOSCOPY;  Service: Gastroenterology;;  . Otho Darner SIGMOIDOSCOPY N/A 11/21/2018   Procedure: FLEXIBLE SIGMOIDOSCOPY;  Surgeon: [name], MD;  Location: [facility] ENDOSCOPY;  Service: Gastroenterology;  Laterality: N/A;  . LAPAROTOMY N/A 08/31/2017   Procedure: EXPLORATORY LAPAROTOMY for SBO, ileocolectomy, removal of piece of uterine wall;  Surgeon: [name], MD;  Location: [facility] ORS;  Service: General;  Laterality: N/A;  . LASER ABLATION CONDOLAMATA N/A 02/22/2018   Procedure: LASER ABLATION/REMOVAL OF [id number] AROUND ANUS AND VAGINA;  Surgeon: Michael Boston, MD;  Location: [place];  Service: General;  Laterality: N/A;  . OOPHORECTOMY    . PORTA CATH INSERTION N/A 05/13/2018   Procedure: PORTA CATH INSERTION;  Surgeon: Katha Cabal, MD;  Location: [facility];  Service: Cardiovascular;  Laterality: N/A;  . SMALL INTESTINE SURGERY    . TANDEM RING INSERTION     x3  . THORACOTOMY    . TOTAL KNEE ARTHROPLASTY Left 04/24/2018   Procedure: TOTAL KNEE ARTHROPLASTY;  Surgeon: Lovell Sheehan, MD;  Location: [facility] ORS;  Service: Orthopedics;  Laterality: Left;    SOCIAL HISTORY: Social History   Socioeconomic History  . Marital status: Divorced    Spouse name: Not on file  . Number of children: Not on file  . Years of education: Not on file  . Highest education level: Not on file  Occupational History  . Not on file  Social Needs  . Financial resource strain: Not hard at all  . Food insecurity    Worry: Never true    Inability: Never true  . Transportation needs    Medical: No     Non-medical: No  Tobacco Use  . Smoking status: Former Smoker    Quit date: 10/16/2006    Years since quitting: 12.7  . Smokeless tobacco: Never Used  Substance and Sexual Activity  . Alcohol use: Not Currently    Frequency: Never    Comment: seldom  . Drug use: Yes    Types: Marijuana    Comment: not very often  . Sexual activity: Not Currently    Birth control/protection: Post-menopausal    Comment: Not Asked  Lifestyle  . Physical activity    Days per week: Patient refused    Minutes per session: Patient refused  . Stress: Only a little  Relationships  . Social Herbalist on phone: Patient refused    Gets together: Patient refused    Attends religious service: Patient refused    Active member of club or organization: Patient refused    Attends meetings of clubs or organizations: Patient refused    Relationship status: Patient refused  . Intimate partner violence    Fear of  current or ex partner: No    Emotionally abused: No    Physically abused: No    Forced sexual activity: No  Other Topics Concern  . Not on file  Social History Narrative  . Not on file    FAMILY HISTORY: Family History  Problem Relation Age of Onset  . Hypertension Father   . Diabetes Father   . Alcohol abuse Daughter   . Hypertension Maternal Grandmother   . Diabetes Maternal Grandmother   . Diabetes Paternal Grandmother   . Hypertension Paternal Grandmother     ALLERGIES:  is allergic to ketamine.  MEDICATIONS:  Current Outpatient Medications  Medication Sig Dispense Refill  . amitriptyline (ELAVIL) 75 MG tablet Take 1 tablet (75 mg total) by mouth at bedtime. 90 tablet 1  . Calcium Carb-Cholecalciferol (CALCIUM 500 +D) 500-400 MG-UNIT TABS Take 2 tablets by mouth daily.    . diphenoxylate-atropine (LOMOTIL) 2.5-0.025 MG tablet TAKE (2) TABLETS FOUR TIMES DAILY. 240 tablet 0  . hydrocortisone (CORTEF) 20 MG tablet Take 1 tablet (20 mg total) by mouth daily. 30 tablet 0  .  hydrOXYzine (ATARAX/VISTARIL) 10 MG tablet Take 1 tablet (10 mg total) by mouth 3 (three) times daily as needed. 60 tablet 0  . hyoscyamine (LEVBID) 0.375 MG 12 hr tablet Take 1 tablet (0.375 mg total)by mouth 2 (two) times daily. 60 tablet 0  . ondansetron (ZOFRAN ODT) 4 MG disintegrating tablet Take 1 tablet (4 mg total) by mouth every 8 (eight) hours as needed for nausea or vomiting. 30 tablet 2  . oxyCODONE (OXYCONTIN) 15 mg 12 hr tablet Take 1 tablet (15 mg total) by mouth every 12 (twelve) hours. 60 tablet 0  . Oxycodone HCl 10 MG TABS Take 1 tablet (10 mg total) by mouth every 6 (six) hours as needed (for breakthrough pain). 120 tablet 0  . pantoprazole (PROTONIX) 40 MG tablet Take 1 tablet (40 mg total) by mouth daily. 90 tablet 1  . potassium chloride SA (K-DUR) 20 MEQ tablet Take 1 tablet (20 mEq total) by mouth daily as needed (if having nausea/vomiting- to supplements for lost potassium). 30 tablet 0  . promethazine (PHENERGAN) 25 MG tablet Take 1 tablet (25 mg total) by mouth every 8 (eight) hours as needed for nausea or vomiting. 30 tablet 0  . rivaroxaban (XARELTO) 20 MG TABS tablet Take 1 tablet (20 mg total) by mouth daily with supper. (Patient taking differently: Take 20 mg by mouth daily with supper. Takes in the morning) 30 tablet 6  . famotidine (PEPCID) 20 MG tablet Take 1 tablet (20 mg total) by mouth 2 (two) times daily for 5 days. 10 tablet 0  . feeding supplement, ENSURE ENLIVE, (ENSURE ENLIVE) LIQD Take 237 mLs by mouth 3 (three) times daily between meals. 237 mL 12  . Multiple Vitamins-Minerals (MULTIVITAMIN WITH MINERALS) tablet Take 1 tablet by mouth daily. 30 tablet 0  . VENTOLIN HFA 108 (90 Base) MCG/ACT inhaler Inhale 1-2 puffs into the lungs every 4 (four) hours as needed for shortness of breath. 1 Inhaler 1   No current facility-administered medications for this visit.    Facility-Administered Medications Ordered in Other Visits  Medication Dose Route Frequency  Provider Last Rate Last Dose  . heparin lock flush 100 unit/mL  500 Units Intravenous Once Corcoran, Melissa C, MD      . sodium chloride 0.9 % 1,000 mL with potassium chloride 20 mEq infusion   Intravenous Once Karen Kitchens, NP      .  sodium chloride flush (NS) 0.9 % injection 10 mL  10 mL Intravenous Once Borders, Vonna Kotyk R, NP          .  PHYSICAL EXAMINATION: ECOG PERFORMANCE STATUS: 1 - Symptomatic but completely ambulatory  Vitals:   07/10/19 0845  BP: (!) 128/91  Pulse: 94  Resp: 16  Temp: (!) 94 F (34.4 C)   Filed Weights   07/10/19 0845  Weight: 163 lb (73.9 kg)    Physical Exam  Constitutional: She is oriented to person, place, and time and well-developed, well-nourished, and in no distress.  Alone.  Walking herself.   HENT:  Head: Normocephalic and atraumatic.  Mouth/Throat: Oropharynx is clear and moist. No oropharyngeal exudate.  Eyes: Pupils are equal, round, and reactive to light.  Neck: Normal range of motion. Neck supple.  Cardiovascular: Normal rate and regular rhythm.  Pulmonary/Chest: Effort normal and breath sounds normal. No respiratory distress. She has no wheezes.  Abdominal: Soft. Bowel sounds are normal. She exhibits no distension and no mass. There is no rebound and no guarding.  Musculoskeletal: Normal range of motion.        General: No tenderness or edema.  Neurological: She is alert and oriented to person, place, and time.  Skin: Skin is warm.  Psychiatric: Affect normal.     LABORATORY DATA:  I have reviewed the data as listed Lab Results  Component Value Date   WBC 3.8 (L) 07/10/2019   HGB 12.4 07/10/2019   HCT 37.7 07/10/2019   MCV 100.5 (H) 07/10/2019   PLT 165 07/10/2019   Recent Labs    10/21/18 0845  06/30/19 1300 07/03/19 0910 07/10/19 0816  NA 134*   < > 138 138 136  K 3.9   < > 3.7 4.0 3.8  CL 98   < > 102 105 102  CO2 24   < > 25 26 27   GLUCOSE 111*   < > 156* 139* 92  BUN 17   < > 10 13 12   CREATININE 0.91    < > 0.80 0.78 0.78  CALCIUM 9.6   < > 9.3 8.6* 9.2  GFRNONAA >60   < > >60 >60 >60  GFRAA >60   < > >60 >60 >60  PROT 8.6*   < > 7.2 6.5 7.1  ALBUMIN 4.4   < > 3.9 3.3* 3.6  AST 48*   < > 50* 40 44*  ALT 67*   < > 76* 57* 103*  ALKPHOS 185*   < > 118 131* 215*  BILITOT 1.4*   < > 0.4 0.5 0.4  BILIDIR 0.3*  --   --   --   --    < > = values in this interval not displayed.    RADIOGRAPHIC STUDIES: I have personally reviewed the radiological images as listed and agreed with the findings in the report. Dg Abd 1 View  Result Date: 06/24/2019 CLINICAL DATA:  53 year old female with evidence of small bowel obstruction on CT several days ago. Metastatic cervical carcinoma. EXAM: ABDOMEN - 1 VIEW COMPARISON:  06/23/2019 and earlier. FINDINGS: Portable supine views at 0941 hours. Enteric tube has been removed. There is only trace residual oral contrast in the large bowel, at the splenic flexure and descending colon. Cholecystectomy clips and right abdominal staple line redemonstrated. Non obstructed bowel gas pattern. Partially visible right chest port. Bilateral lung pulmonary nodules were better demonstrated by CT. Normal visible mediastinal contours. Degenerative changes in the spine. No acute osseous  abnormality identified. IMPRESSION: 1. Continued non obstructed bowel gas pattern since yesterday. Minimal residual bowel contrast. Enteric tube removed. 2. Pulmonary metastases as demonstrated by CT. Electronically Signed   By: Genevie Ann M.D.   On: 06/24/2019 10:00   Dg Abd 1 View  Result Date: 06/22/2019 CLINICAL DATA:  NG tube placement. EXAM: ABDOMEN - 1 VIEW COMPARISON:  06/21/2019 FINDINGS: Nasogastric tube tip overlies the level of the mid stomach. Bowel gas pattern is nonobstructed. Numerous pulmonary nodules are again identified at the lung bases. IMPRESSION: Nasogastric tube tip to the level of the mid stomach. Electronically Signed   By: Nolon Nations M.D.   On: 06/22/2019 14:52   Ct Abdomen  Pelvis W Contrast  Result Date: 06/21/2019 CLINICAL DATA:  Onset diarrhea and epigastric pain yesterday. History of metastatic cervical carcinoma. EXAM: CT ABDOMEN AND PELVIS WITH CONTRAST TECHNIQUE: Multidetector CT imaging of the abdomen and pelvis was performed using the standard protocol following bolus administration of intravenous contrast. CONTRAST:  100 mL OMNIPAQUE IOHEXOL 300 MG/ML  SOLN COMPARISON:  CT abdomen and pelvis 06/07/2019 and CT chest, abdomen and pelvis 04/21/2019. FINDINGS: Lower chest: No pleural or pericardial effusion. Multiple bilateral lower lobe pulmonary nodules consistent with metastatic carcinoma are unchanged. Hepatobiliary: No focal liver lesion. The patient is status post cholecystectomy. Mild intra and extrahepatic biliary ductal prominence is unchanged. Pancreas: Unremarkable. No pancreatic ductal dilatation or surrounding inflammatory changes. Spleen: Normal in size without focal abnormality. Adrenals/Urinary Tract: Single punctate nonobstructing stones in the kidneys are unchanged. No hydronephrosis or ureteral stone. Urinary bladder is unremarkable. Adrenal glands appear normal. Stomach/Bowel: The patient is status post right hemicolectomy. The anastomosis is normal in appearance. There is fecalization of small bowel contents in loops in the pelvis consistent with slow transit, new since the prior exams. A transition point is seen in the right lower quadrant of the abdomen on images 65-68 where more distal small bowel loops are completely decompressed. No pneumatosis, portal venous gas or free intraperitoneal air. The sigmoid colon is incompletely distended with thickening of the walls, unchanged. The stomach is unremarkable. Vascular/Lymphatic: Aortic atherosclerosis. No enlarged abdominal or pelvic lymph nodes. Reproductive: Status post hysterectomy. No adnexal masses. Other: None. Musculoskeletal: No acute or focal abnormality. Convex left scoliosis and multilevel  degenerative change noted. IMPRESSION: The examination is positive for small bowel obstruction which appears to be due to adhesions with a transition point in the right lower quadrant. No CT signs of bowel ischemia. No change in multiple pulmonary nodules consistent with metastatic disease. No change in chronic wall thickening of the sigmoid colon compatible chronic colitis likely secondary to prior radiation therapy. Electronically Signed   By: Inge Rise M.D.   On: 06/21/2019 10:36   Dg Abd Portable 1v-small Bowel Obstruction Protocol-initial, 8 Hr Delay  Result Date: 06/23/2019 CLINICAL DATA:  Initial evaluation for SBO, 8 hours status post contrast administration. EXAM: PORTABLE ABDOMEN - 1 VIEW COMPARISON:  Prior radiograph from 06/22/2019. FINDINGS: Enteric tube in place with tip in side hole overlying the stomach, beyond the GE junction. Enteric contrast material seen diffusely throughout the colon to the level of the distal sigmoid. Some contrast is seen within the distal ileum within the right lower quadrant. No significant small bowel dilatation identified. No soft tissue mass or abnormal calcification. Cholecystectomy clips noted. No visible free air. Visualized lung bases are clear. Lumbar levoscoliosis with moderate multilevel degenerative spondylolysis noted. IMPRESSION: 1. Enteric contrast material diffusely throughout the colon to the level of the  distal sigmoid, with few scattered nondilated contrast filled loops of distal small bowel within the right lower quadrant. No significant small bowel dilatation identified. 2. Enteric tube in place with tip and side hole overlying the stomach, beyond the GE junction Electronically Signed   By: Jeannine Boga M.D.   On: 06/23/2019 00:54   Dg Abd Portable 1 View  Result Date: 06/21/2019 CLINICAL DATA:  NG tube placement. History of cervical and lung cancer. EXAM: PORTABLE ABDOMEN - 1 VIEW COMPARISON:  CT abdomen pelvis-06/21/2019 FINDINGS:  Enteric tube tip and side port project over the mid body of the stomach. No pneumoperitoneum, pneumatosis or portal venous gas. Small amount of excreted contrast is seen within the bilateral renal collecting systems as a sequela of recent intravenous contrasted examination. Limited visualization of lower thorax demonstrates scattered bilateral pulmonary nodules compatible with known metastatic disease. Port a catheter tip projects over the superior cavoatrial junction. Post cholecystectomy. Mild-to-moderate scoliotic curvature of the thoracolumbar spine. IMPRESSION: Enteric tube tip and side port project over the mid body of the stomach. Electronically Signed   By: Sandi Mariscal M.D.   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  Currently on Keytruda-question inflammatory arthritis versus others.  See discussion below  # proceed with Keytruda cycle #4 Labs today reviewed;  acceptable for treatment today.  Plan to get a CT scan of the chest/will compare that to CT scan from August 4 [about 3 months on therapy of Keytruda-to assess continued treatment with Keytruda].  Discussed my concerns regarding progression on Keytruda-however will await above CT scan.  # Left shoulder effusion/inflammatory arthritis/ ? Right knee fluid aspiration currently on "gout medication"/ colchcine.  Stable.  # adrenal insuffiency-noncompliance with medication [patient had been confused between hydroxyzine and hydrocortisone] improved- on hydrocortisone 20 mg/day. Awaiting appt on 9/28.   # Chronic diarrhea- worse/quality of life issue-"short bowel"discussed with GI re: colostomy??  #Peripheral neuropathy stable; continue  gabapentin.  # Hypomagnesemia-secondary platinum chemotherapy-mag 1.5 mag today.  # PSBO- recent sec to scar/malignancy- resolved spontaneously.  Improved.  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   # DISPOSITION:  # Keytruda/Mag today.  # 1 weeks- mag level; IVFs over 1 hour /Mag2h infusion  # 2 weeks-  mag level; IVFs over 1 hour /Mag2h infusion  # follow up in 3 weeks-MD-Keytruda /cbc/cmp/mag/; Mag 4 gm/2 hour; CT scan chest prior- Dr.B    All questions were answered. The patient knows to call the clinic with any problems, questions or concerns.     GCammie Sickle MD 07/16/2019 7:03 AM

## 2019-07-10 NOTE — Assessment & Plan Note (Addendum)
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  Currently on Keytruda-question inflammatory arthritis versus others.  See discussion below  # proceed with Keytruda cycle #4 Labs today reviewed;  acceptable for treatment today.  Plan to get a CT scan of the chest/will compare that to CT scan from August 4 [about 3 months on therapy of Keytruda-to assess continued treatment with Keytruda].  Discussed my concerns regarding progression on Keytruda-however will await above CT scan.  # Left shoulder effusion/inflammatory arthritis/ ? Right knee fluid aspiration currently on "gout medication"/ colchcine.  Stable.  # adrenal insuffiency-noncompliance with medication [patient had been confused between hydroxyzine and hydrocortisone] improved- on hydrocortisone 20 mg/day. Awaiting appt on 9/28.   # Chronic diarrhea- worse/quality of life issue-"short bowel"discussed with GI re: colostomy??  #Peripheral neuropathy stable; continue gabapentin.  # Hypomagnesemia-secondary platinum chemotherapy-mag 1.5 mag today.  # PSBO- recent sec to scar/malignancy- resolved spontaneously.  Improved.  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   # DISPOSITION:  # Keytruda/Mag today.  # 1 weeks- mag level; IVFs over 1 hour /Mag2h infusion  # 2 weeks-  mag level; IVFs over 1 hour /Mag2h infusion  # follow up in 3 weeks-MD-Keytruda /cbc/cmp/mag/; Mag 4 gm/2 hour; CT scan chest prior- Dr.B

## 2019-07-17 ENCOUNTER — Other Ambulatory Visit: Payer: Self-pay

## 2019-07-17 ENCOUNTER — Inpatient Hospital Stay: Payer: Medicaid Other | Attending: Internal Medicine

## 2019-07-17 ENCOUNTER — Inpatient Hospital Stay: Payer: Medicaid Other

## 2019-07-17 DIAGNOSIS — B1920 Unspecified viral hepatitis C without hepatic coma: Secondary | ICD-10-CM | POA: Insufficient documentation

## 2019-07-17 DIAGNOSIS — C530 Malignant neoplasm of endocervix: Secondary | ICD-10-CM | POA: Insufficient documentation

## 2019-07-17 DIAGNOSIS — Z9114 Patient's other noncompliance with medication regimen: Secondary | ICD-10-CM | POA: Insufficient documentation

## 2019-07-17 DIAGNOSIS — Z87891 Personal history of nicotine dependence: Secondary | ICD-10-CM | POA: Insufficient documentation

## 2019-07-17 DIAGNOSIS — C7802 Secondary malignant neoplasm of left lung: Secondary | ICD-10-CM | POA: Diagnosis not present

## 2019-07-17 DIAGNOSIS — Z86718 Personal history of other venous thrombosis and embolism: Secondary | ICD-10-CM | POA: Insufficient documentation

## 2019-07-17 DIAGNOSIS — Z923 Personal history of irradiation: Secondary | ICD-10-CM | POA: Diagnosis not present

## 2019-07-17 DIAGNOSIS — Z9221 Personal history of antineoplastic chemotherapy: Secondary | ICD-10-CM | POA: Diagnosis not present

## 2019-07-17 DIAGNOSIS — Z86711 Personal history of pulmonary embolism: Secondary | ICD-10-CM | POA: Diagnosis not present

## 2019-07-17 DIAGNOSIS — Z90722 Acquired absence of ovaries, bilateral: Secondary | ICD-10-CM | POA: Diagnosis not present

## 2019-07-17 DIAGNOSIS — Z7901 Long term (current) use of anticoagulants: Secondary | ICD-10-CM | POA: Insufficient documentation

## 2019-07-17 DIAGNOSIS — Z79899 Other long term (current) drug therapy: Secondary | ICD-10-CM | POA: Diagnosis not present

## 2019-07-17 DIAGNOSIS — C7801 Secondary malignant neoplasm of right lung: Secondary | ICD-10-CM | POA: Diagnosis not present

## 2019-07-17 DIAGNOSIS — Z95828 Presence of other vascular implants and grafts: Secondary | ICD-10-CM

## 2019-07-17 LAB — MAGNESIUM: Magnesium: 1.5 mg/dL — ABNORMAL LOW (ref 1.7–2.4)

## 2019-07-17 MED ORDER — SODIUM CHLORIDE 0.9 % IV SOLN
Freq: Once | INTRAVENOUS | Status: AC
  Administered 2019-07-17: 09:00:00 via INTRAVENOUS
  Filled 2019-07-17: qty 250

## 2019-07-17 MED ORDER — SODIUM CHLORIDE 0.9 % IV SOLN: Freq: Once | INTRAVENOUS | Status: DC

## 2019-07-17 MED ORDER — SODIUM CHLORIDE 0.9 % IV SOLN
INTRAVENOUS | Status: DC
  Administered 2019-07-17: 09:00:00 via INTRAVENOUS
  Filled 2019-07-17: qty 250

## 2019-07-17 MED ORDER — SODIUM CHLORIDE 0.9% FLUSH
10.0000 mL | Freq: Once | INTRAVENOUS | Status: AC
  Administered 2019-07-17: 08:00:00 10 mL via INTRAVENOUS
  Filled 2019-07-17: qty 10

## 2019-07-17 MED ORDER — HEPARIN SOD (PORK) LOCK FLUSH 100 UNIT/ML IV SOLN
500.0000 [IU] | Freq: Once | INTRAVENOUS | Status: AC
  Administered 2019-07-17: 500 [IU] via INTRAVENOUS
  Filled 2019-07-17: qty 5

## 2019-07-17 MED ORDER — MAGNESIUM SULFATE 4 GM/100ML IV SOLN
4.0000 g | Freq: Once | INTRAVENOUS | Status: AC
  Administered 2019-07-17: 4 g via INTRAVENOUS
  Filled 2019-07-17: qty 100

## 2019-07-17 MED ORDER — ONDANSETRON HCL 4 MG/2ML IJ SOLN
8.0000 mg | Freq: Once | INTRAMUSCULAR | Status: AC
  Administered 2019-07-17: 11:00:00 8 mg via INTRAVENOUS
  Filled 2019-07-17: qty 4

## 2019-07-22 ENCOUNTER — Telehealth: Payer: Self-pay | Admitting: *Deleted

## 2019-07-22 ENCOUNTER — Other Ambulatory Visit: Payer: Self-pay

## 2019-07-22 ENCOUNTER — Inpatient Hospital Stay
Admission: EM | Admit: 2019-07-22 | Discharge: 2019-07-24 | DRG: 392 | Disposition: A | Discharge status: Home / Self Care | Payer: Medicaid Other | Attending: Specialist | Admitting: Specialist

## 2019-07-22 ENCOUNTER — Emergency Department: Payer: Medicaid Other

## 2019-07-22 ENCOUNTER — Encounter: Payer: Self-pay | Admitting: Emergency Medicine

## 2019-07-22 ENCOUNTER — Encounter: Payer: Self-pay | Admitting: Internal Medicine

## 2019-07-22 DIAGNOSIS — M47892 Other spondylosis, cervical region: Secondary | ICD-10-CM | POA: Diagnosis present

## 2019-07-22 DIAGNOSIS — Z8541 Personal history of malignant neoplasm of cervix uteri: Secondary | ICD-10-CM

## 2019-07-22 DIAGNOSIS — K5900 Constipation, unspecified: Secondary | ICD-10-CM | POA: Diagnosis present

## 2019-07-22 DIAGNOSIS — I700 Atherosclerosis of aorta: Secondary | ICD-10-CM | POA: Diagnosis present

## 2019-07-22 DIAGNOSIS — Z20828 Contact with and (suspected) exposure to other viral communicable diseases: Secondary | ICD-10-CM | POA: Diagnosis present

## 2019-07-22 DIAGNOSIS — Z888 Allergy status to other drugs, medicaments and biological substances status: Secondary | ICD-10-CM

## 2019-07-22 DIAGNOSIS — I10 Essential (primary) hypertension: Secondary | ICD-10-CM | POA: Diagnosis present

## 2019-07-22 DIAGNOSIS — I9589 Other hypotension: Secondary | ICD-10-CM | POA: Diagnosis present

## 2019-07-22 DIAGNOSIS — Z7901 Long term (current) use of anticoagulants: Secondary | ICD-10-CM

## 2019-07-22 DIAGNOSIS — K219 Gastro-esophageal reflux disease without esophagitis: Secondary | ICD-10-CM | POA: Diagnosis present

## 2019-07-22 DIAGNOSIS — D6851 Activated protein C resistance: Secondary | ICD-10-CM | POA: Diagnosis present

## 2019-07-22 DIAGNOSIS — Z8249 Family history of ischemic heart disease and other diseases of the circulatory system: Secondary | ICD-10-CM

## 2019-07-22 DIAGNOSIS — Z833 Family history of diabetes mellitus: Secondary | ICD-10-CM

## 2019-07-22 DIAGNOSIS — Z87891 Personal history of nicotine dependence: Secondary | ICD-10-CM

## 2019-07-22 DIAGNOSIS — R1084 Generalized abdominal pain: Principal | ICD-10-CM | POA: Diagnosis present

## 2019-07-22 DIAGNOSIS — B1920 Unspecified viral hepatitis C without hepatic coma: Secondary | ICD-10-CM | POA: Diagnosis present

## 2019-07-22 DIAGNOSIS — G8929 Other chronic pain: Secondary | ICD-10-CM | POA: Diagnosis present

## 2019-07-22 DIAGNOSIS — R109 Unspecified abdominal pain: Secondary | ICD-10-CM | POA: Diagnosis present

## 2019-07-22 DIAGNOSIS — Z9221 Personal history of antineoplastic chemotherapy: Secondary | ICD-10-CM

## 2019-07-22 DIAGNOSIS — Z96652 Presence of left artificial knee joint: Secondary | ICD-10-CM | POA: Diagnosis present

## 2019-07-22 DIAGNOSIS — E559 Vitamin D deficiency, unspecified: Secondary | ICD-10-CM | POA: Diagnosis present

## 2019-07-22 DIAGNOSIS — F329 Major depressive disorder, single episode, unspecified: Secondary | ICD-10-CM | POA: Diagnosis present

## 2019-07-22 LAB — CBC
HCT: 36.6 % (ref 36.0–46.0)
HCT: 36.8 % (ref 36.0–46.0)
Hemoglobin: 12.3 g/dL (ref 12.0–15.0)
Hemoglobin: 12.4 g/dL (ref 12.0–15.0)
MCH: 33.1 pg (ref 26.0–34.0)
MCH: 33.2 pg (ref 26.0–34.0)
MCHC: 33.6 g/dL (ref 30.0–36.0)
MCHC: 33.7 g/dL (ref 30.0–36.0)
MCV: 98.4 fL (ref 80.0–100.0)
MCV: 98.7 fL (ref 80.0–100.0)
Platelets: 166 10*3/uL (ref 150–400)
Platelets: 169 10*3/uL (ref 150–400)
RBC: 3.72 MIL/uL — ABNORMAL LOW (ref 3.87–5.11)
RBC: 3.73 MIL/uL — ABNORMAL LOW (ref 3.87–5.11)
RDW: 14 % (ref 11.5–15.5)
RDW: 14.1 % (ref 11.5–15.5)
WBC: 2.8 10*3/uL — ABNORMAL LOW (ref 4.0–10.5)
WBC: 2.9 10*3/uL — ABNORMAL LOW (ref 4.0–10.5)
nRBC: 0 % (ref 0.0–0.2)
nRBC: 0 % (ref 0.0–0.2)

## 2019-07-22 LAB — URINALYSIS, COMPLETE (UACMP) WITH MICROSCOPIC
Bacteria, UA: NONE SEEN
Bilirubin Urine: NEGATIVE
Glucose, UA: NEGATIVE mg/dL
Hgb urine dipstick: NEGATIVE
Ketones, ur: NEGATIVE mg/dL
Leukocytes,Ua: NEGATIVE
Nitrite: NEGATIVE
Protein, ur: NEGATIVE mg/dL
Specific Gravity, Urine: 1.006 (ref 1.005–1.030)
Squamous Epithelial / HPF: NONE SEEN (ref 0–5)
pH: 8 (ref 5.0–8.0)

## 2019-07-22 LAB — COMPREHENSIVE METABOLIC PANEL
ALT: 43 U/L (ref 0–44)
AST: 35 U/L (ref 15–41)
Albumin: 3.7 g/dL (ref 3.5–5.0)
Alkaline Phosphatase: 139 U/L — ABNORMAL HIGH (ref 38–126)
Anion gap: 9 (ref 5–15)
BUN: 18 mg/dL (ref 6–20)
CO2: 27 mmol/L (ref 22–32)
Calcium: 9.2 mg/dL (ref 8.9–10.3)
Chloride: 100 mmol/L (ref 98–111)
Creatinine, Ser: 0.9 mg/dL (ref 0.44–1.00)
GFR calc Af Amer: >60 mL/min (ref >60)
GFR calc non Af Amer: >60 mL/min (ref >60)
Glucose, Bld: 84 mg/dL (ref 70–99)
Potassium: 3.8 mmol/L (ref 3.5–5.1)
Sodium: 136 mmol/L (ref 135–145)
Total Bilirubin: 0.5 mg/dL (ref 0.3–1.2)
Total Protein: 6.7 g/dL (ref 6.5–8.1)

## 2019-07-22 LAB — DIFFERENTIAL
Abs Immature Granulocytes: 0.03 10*3/uL (ref 0.00–0.07)
Basophils Absolute: 0 10*3/uL (ref 0.0–0.1)
Basophils Relative: 0 %
Eosinophils Absolute: 0 10*3/uL (ref 0.0–0.5)
Eosinophils Relative: 1 %
Immature Granulocytes: 1 %
Lymphocytes Relative: 26 %
Lymphs Abs: 0.7 10*3/uL (ref 0.7–4.0)
Monocytes Absolute: 0.2 10*3/uL (ref 0.1–1.0)
Monocytes Relative: 8 %
Neutro Abs: 1.7 10*3/uL (ref 1.7–7.7)
Neutrophils Relative %: 64 %

## 2019-07-22 LAB — LIPASE, BLOOD: Lipase: 22 U/L (ref 11–51)

## 2019-07-22 MED ORDER — HYOSCYAMINE SULFATE ER 0.375 MG PO TB12
0.3750 mg | ORAL_TABLET | Freq: Two times a day (BID) | ORAL | Status: DC
  Administered 2019-07-22 – 2019-07-24 (×4): 0.375 mg via ORAL
  Filled 2019-07-22 (×5): qty 1

## 2019-07-22 MED ORDER — RIVAROXABAN 20 MG PO TABS
20.0000 mg | ORAL_TABLET | Freq: Every day | ORAL | Status: DC
  Administered 2019-07-22 – 2019-07-23 (×2): 20 mg via ORAL
  Filled 2019-07-22 (×3): qty 1

## 2019-07-22 MED ORDER — IOHEXOL 300 MG/ML  SOLN
100.0000 mL | Freq: Once | INTRAMUSCULAR | Status: AC | PRN
  Administered 2019-07-22: 100 mL via INTRAVENOUS

## 2019-07-22 MED ORDER — MORPHINE SULFATE (PF) 4 MG/ML IV SOLN
4.0000 mg | Freq: Once | INTRAVENOUS | Status: AC
  Administered 2019-07-22: 10:00:00 4 mg via INTRAVENOUS
  Filled 2019-07-22: qty 1

## 2019-07-22 MED ORDER — CHLORHEXIDINE GLUCONATE CLOTH 2 % EX PADS
6.0000 | MEDICATED_PAD | Freq: Every day | CUTANEOUS | Status: DC
  Administered 2019-07-23 (×2): 6 via TOPICAL

## 2019-07-22 MED ORDER — ONDANSETRON HCL 4 MG/2ML IJ SOLN
4.0000 mg | Freq: Once | INTRAMUSCULAR | Status: AC
  Administered 2019-07-22: 10:00:00 4 mg via INTRAVENOUS
  Filled 2019-07-22: qty 2

## 2019-07-22 MED ORDER — ONDANSETRON HCL 4 MG PO TABS
4.0000 mg | ORAL_TABLET | Freq: Four times a day (QID) | ORAL | Status: DC | PRN
  Administered 2019-07-23 – 2019-07-24 (×3): 4 mg via ORAL
  Filled 2019-07-22 (×3): qty 1

## 2019-07-22 MED ORDER — DOCUSATE SODIUM 100 MG PO CAPS: 100.0000 mg | ORAL_CAPSULE | Freq: Two times a day (BID) | ORAL | Status: DC | PRN

## 2019-07-22 MED ORDER — ONDANSETRON HCL 4 MG/2ML IJ SOLN
4.0000 mg | Freq: Four times a day (QID) | INTRAMUSCULAR | Status: DC | PRN
  Administered 2019-07-22 – 2019-07-23 (×2): 4 mg via INTRAVENOUS
  Filled 2019-07-22 (×2): qty 2

## 2019-07-22 MED ORDER — ALBUTEROL SULFATE (2.5 MG/3ML) 0.083% IN NEBU: 2.5000 mg | INHALATION_SOLUTION | RESPIRATORY_TRACT | Status: DC | PRN

## 2019-07-22 MED ORDER — FAMOTIDINE 20 MG PO TABS: 20.0000 mg | ORAL_TABLET | Freq: Two times a day (BID) | ORAL | Status: DC | PRN

## 2019-07-22 MED ORDER — HYDROMORPHONE HCL 1 MG/ML IJ SOLN
1.0000 mg | Freq: Once | INTRAMUSCULAR | Status: AC
  Administered 2019-07-22: 14:00:00 1 mg via INTRAVENOUS
  Filled 2019-07-22: qty 1

## 2019-07-22 MED ORDER — ACETAMINOPHEN 325 MG PO TABS: 650.0000 mg | ORAL_TABLET | Freq: Four times a day (QID) | ORAL | Status: DC | PRN

## 2019-07-22 MED ORDER — HYDROXYZINE HCL 10 MG PO TABS
10.0000 mg | ORAL_TABLET | Freq: Three times a day (TID) | ORAL | Status: DC | PRN
  Filled 2019-07-22: qty 1

## 2019-07-22 MED ORDER — HYDROMORPHONE HCL 1 MG/ML IJ SOLN
1.0000 mg | Freq: Once | INTRAMUSCULAR | Status: AC
  Administered 2019-07-22: 12:00:00 1 mg via INTRAVENOUS
  Filled 2019-07-22: qty 1

## 2019-07-22 MED ORDER — OXYCODONE HCL 5 MG PO TABS
10.0000 mg | ORAL_TABLET | Freq: Four times a day (QID) | ORAL | Status: DC | PRN
  Administered 2019-07-22 – 2019-07-23 (×3): 10 mg via ORAL
  Filled 2019-07-22 (×3): qty 2

## 2019-07-22 MED ORDER — POLYETHYLENE GLYCOL 3350 17 G PO PACK
17.0000 g | PACK | Freq: Every day | ORAL | Status: DC | PRN
  Administered 2019-07-22: 22:00:00 17 g via ORAL
  Filled 2019-07-22: qty 1

## 2019-07-22 MED ORDER — PANTOPRAZOLE SODIUM 40 MG PO TBEC
40.0000 mg | DELAYED_RELEASE_TABLET | Freq: Every day | ORAL | Status: DC
  Administered 2019-07-23 – 2019-07-24 (×2): 40 mg via ORAL
  Filled 2019-07-22 (×2): qty 1

## 2019-07-22 MED ORDER — CALCIUM CARB-CHOLECALCIFEROL 500-400 MG-UNIT PO TABS: 2.0000 | ORAL_TABLET | Freq: Every day | ORAL | Status: DC

## 2019-07-22 MED ORDER — ACETAMINOPHEN 650 MG RE SUPP: 650.0000 mg | Freq: Four times a day (QID) | RECTAL | Status: DC | PRN

## 2019-07-22 MED ORDER — OXYCODONE HCL ER 15 MG PO T12A
15.0000 mg | EXTENDED_RELEASE_TABLET | Freq: Two times a day (BID) | ORAL | Status: DC
  Administered 2019-07-22 – 2019-07-24 (×4): 15 mg via ORAL
  Filled 2019-07-22 (×4): qty 1

## 2019-07-22 MED ORDER — FLEET ENEMA 7-19 GM/118ML RE ENEM
1.0000 | ENEMA | Freq: Once | RECTAL | Status: AC
  Administered 2019-07-22: 19:00:00 1 via RECTAL

## 2019-07-22 MED ORDER — HYDROCORTISONE 10 MG PO TABS
20.0000 mg | ORAL_TABLET | Freq: Every day | ORAL | Status: DC
  Administered 2019-07-23 – 2019-07-24 (×2): 20 mg via ORAL
  Filled 2019-07-22 (×2): qty 2

## 2019-07-22 MED ORDER — AMITRIPTYLINE HCL 25 MG PO TABS
75.0000 mg | ORAL_TABLET | Freq: Every day | ORAL | Status: DC
  Administered 2019-07-22 – 2019-07-23 (×2): 75 mg via ORAL
  Filled 2019-07-22 (×2): qty 3

## 2019-07-22 MED ORDER — ONDANSETRON HCL 4 MG/2ML IJ SOLN
4.0000 mg | Freq: Once | INTRAMUSCULAR | Status: AC
  Administered 2019-07-22: 13:00:00 4 mg via INTRAVENOUS
  Filled 2019-07-22: qty 2

## 2019-07-22 MED ORDER — SODIUM CHLORIDE 0.9 % IV SOLN
1000.0000 mL | Freq: Once | INTRAVENOUS | Status: AC
  Administered 2019-07-22: 10:00:00 1000 mL via INTRAVENOUS

## 2019-07-22 MED ORDER — ADULT MULTIVITAMIN W/MINERALS CH
1.0000 | ORAL_TABLET | Freq: Every day | ORAL | Status: DC
  Administered 2019-07-23 – 2019-07-24 (×2): 1 via ORAL
  Filled 2019-07-22 (×2): qty 1

## 2019-07-22 MED ORDER — POTASSIUM CHLORIDE CRYS ER 20 MEQ PO TBCR: 20.0000 meq | EXTENDED_RELEASE_TABLET | Freq: Every day | ORAL | Status: DC | PRN

## 2019-07-22 MED ORDER — DIPHENOXYLATE-ATROPINE 2.5-0.025 MG PO TABS: 1.0000 | ORAL_TABLET | Freq: Four times a day (QID) | ORAL | Status: DC | PRN

## 2019-07-22 MED ORDER — PROMETHAZINE HCL 25 MG PO TABS
25.0000 mg | ORAL_TABLET | Freq: Three times a day (TID) | ORAL | Status: DC | PRN
  Administered 2019-07-22 – 2019-07-24 (×3): 25 mg via ORAL
  Filled 2019-07-22 (×7): qty 1

## 2019-07-22 MED ORDER — CALCIUM CARBONATE-VITAMIN D 500-200 MG-UNIT PO TABS
2.0000 | ORAL_TABLET | Freq: Every day | ORAL | Status: DC
  Administered 2019-07-23 – 2019-07-24 (×2): 2 via ORAL
  Filled 2019-07-22 (×2): qty 2

## 2019-07-22 NOTE — H&P (Signed)
[facility] [place] NAME: Joanna Hall    MR#:  [id number]  DATE OF BIRTH:  07-Nov-1965  DATE OF ADMISSION:  07/22/2019  PRIMARY CARE PHYSICIAN: Arnetha Courser, MD   REQUESTING/REFERRING PHYSICIAN: Dr. Lavonia Drafts  CHIEF COMPLAINT:   Chief Complaint  Patient presents with   Abdominal Pain    HISTORY OF PRESENT ILLNESS:  Joanna Hall  is a 53 y.o. female with a known history of cervical cancer currently ongoing chemotherapy, history of hep C, chronic diarrhea, factor V Leiden mutation, anxiety, chronic pain, previous history of C. difficile who presents to the hospital complaining of abdominal pain.  Patient says that she developed abdominal pain progressively getting worse over the past few days which is located in the lower part of her abdomen associate with some intermittent nausea but no vomiting.  Says that normally she has diarrhea but has not had a bowel movement now since this past Wednesday.  She tried taking laxatives at home but it has not improved her symptoms and now she developed some intermittent nausea and therefore came to the ER for further evaluation.  She does have a previous history of small bowel obstruction for which she was admitted to the hospital about a month or so ago but on CT scan of the abdomen pelvis during this ER visit it shows moderate stool burden with some constipation but no evidence of bowel obstruction.  She is received multiple doses of IV narcotics here in the ER but continues to have abdominal pain therefore hospitalist services were contacted for admission.  Denies any fevers, chills, any recent travel history, sick contacts, patient's COVID-19 test is still pending.  She does complain of shortness of breath but says that is chronic for her from her underlying malignancy.  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 lungs and right kidney   Blood transfusion without reported diagnosis    Cervical cancer ([place]) 09/2016   mets lung   Chronic anal fissure    Chronic diarrhea    Dyspnea    Erosive gastropathy 09/18/2017   Factor V Leiden mutation ([place])    Fecal incontinence    Genital warts    GERD (gastroesophageal reflux disease)    GI bleed 10/08/2018   Heart murmur    Hematochezia    Hemorrhoids    Hepatitis C    Chronic, 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   Intestinal infection due to Clostridium difficile 09/18/2017   Macrocytic anemia with vitamin B12 deficiency    Multiple gastric ulcers    Nausea vomiting and diarrhea    Pancolitis ([place]) 07/27/2018   Perianal condylomata    Pneumonia    History of   Pulmonary nodules    Rectal bleeding    Small bowel obstruction ([place]) 08/2017   Stiff neck    limited right turn   Vitamin D deficiency  PAST SURGICAL HISTORY:   Past Surgical History:  Procedure Laterality Date   CHOLECYSTECTOMY     COLON SURGERY  08/2017   resection   COLONOSCOPY WITH PROPOFOL N/A 12/20/2017   Procedure: COLONOSCOPY WITH PROPOFOL;  Surgeon: [name], MD;  Location: [facility] ENDOSCOPY;  Service: Gastroenterology;  Laterality: N/A;   COLONOSCOPY WITH PROPOFOL N/A 07/30/2018   Procedure: COLONOSCOPY WITH PROPOFOL;  Surgeon: [name], MD;  Location: [facility] ENDOSCOPY;  Service: Gastroenterology;  Laterality: N/A;   COLONOSCOPY WITH PROPOFOL N/A 10/10/2018   Procedure: COLONOSCOPY WITH PROPOFOL;  Surgeon:  Lucilla Lame, MD;  Location: [facility] ENDOSCOPY;  Service: Endoscopy;  Laterality: N/A;   DIAGNOSTIC LAPAROSCOPY     ESOPHAGOGASTRODUODENOSCOPY (EGD) WITH PROPOFOL N/A 12/20/2017   Procedure: ESOPHAGOGASTRODUODENOSCOPY (EGD) WITH PROPOFOL;  Surgeon: [name], MD;  Location: [facility] ENDOSCOPY;  Service: Gastroenterology;  Laterality: N/A;   ESOPHAGOGASTRODUODENOSCOPY (EGD) WITH PROPOFOL  07/30/2018   Procedure: ESOPHAGOGASTRODUODENOSCOPY (EGD) WITH PROPOFOL;  Surgeon: [name], MD;  Location: [facility] ENDOSCOPY;  Service: Gastroenterology;;   [place] N/A 11/21/2018   Procedure: FLEXIBLE SIGMOIDOSCOPY;  Surgeon: [name], MD;  Location: [facility] ENDOSCOPY;  Service: Gastroenterology;  Laterality: N/A;   LAPAROTOMY N/A 08/31/2017   Procedure: EXPLORATORY LAPAROTOMY for SBO, ileocolectomy, removal of piece of uterine wall;  Surgeon: [name], MD;  Location: [facility] ORS;  Service: General;  Laterality: N/A;   LASER ABLATION CONDOLAMATA N/A 02/22/2018   Procedure: LASER ABLATION/REMOVAL OF KQASUORVIFB AROUND ANUS AND VAGINA;  Surgeon: Michael Boston, MD;  Location: [place];  Service: General;  Laterality: N/A;   OOPHORECTOMY     PORTA CATH INSERTION N/A 05/13/2018   Procedure: PORTA CATH INSERTION;  Surgeon: Katha Cabal, MD;  Location: [place] CV LAB;  Service: Cardiovascular;  Laterality: N/A;   SMALL INTESTINE SURGERY     TANDEM RING INSERTION     x3   THORACOTOMY     TOTAL KNEE ARTHROPLASTY Left 04/24/2018   Procedure: TOTAL KNEE ARTHROPLASTY;  Surgeon: Lovell Sheehan, MD;  Location: [facility] ORS;  Service: Orthopedics;  Laterality: Left;    SOCIAL HISTORY:   Social History   Tobacco Use   Smoking status: Former Smoker    Packs/day: 0.25    Years: 10.00    Pack years: 2.50    Types: Cigarettes    Quit date: 10/16/2006    Years since quitting: 12.7   Smokeless tobacco: Never Used  Substance Use Topics   Alcohol use:  Not Currently    Frequency: Never    Comment: seldom    FAMILY HISTORY:   Family History  Problem Relation Age of Onset   Hypertension Father    Diabetes Father    Alcohol abuse Daughter    Hypertension Maternal Grandmother    Diabetes Maternal Grandmother    Diabetes Paternal Grandmother    Hypertension Paternal Grandmother     DRUG ALLERGIES:   Allergies  Allergen Reactions   Ketamine Anxiety and Other (See Comments)    Syncope episode/confusion     REVIEW OF SYSTEMS:   Review of Systems  Constitutional: Negative for fever and weight loss.  HENT: Negative for congestion, nosebleeds and tinnitus.   Eyes: Negative for blurred vision, double vision and redness.  Respiratory: Negative for cough, hemoptysis and shortness of breath.   Cardiovascular: Negative for chest pain, orthopnea, leg swelling and PND.  Gastrointestinal: Positive for abdominal pain, constipation and nausea. Negative for diarrhea, melena and vomiting.  Genitourinary: Negative for  dysuria, hematuria and urgency.  Musculoskeletal: Negative for falls and joint pain.  Neurological: Negative for dizziness, tingling, sensory change, focal weakness, seizures, weakness and headaches.  Endo/Heme/Allergies: Negative for polydipsia. Does not bruise/bleed easily.  Psychiatric/Behavioral: Negative for depression and memory loss. The patient is not nervous/anxious.     MEDICATIONS AT HOME:   Prior to Admission medications   Medication Sig Start Date End Date Taking? Authorizing Provider  amitriptyline (ELAVIL) 75 MG tablet Take 1 tablet (75 mg total) by mouth at bedtime. 05/06/19 08/04/19 Yes Vanga, Tally Due, MD  Calcium Carb-Cholecalciferol (CALCIUM 500 +D) 500-400 MG-UNIT TABS Take 2 tablets by mouth daily.   Yes [provider]  diphenoxylate-atropine (LOMOTIL) 2.5-0.025 MG tablet TAKE (2) TABLETS FOUR TIMES DAILY. 04/21/19  Yes Vanga, Tally Due, MD  famotidine (PEPCID) 20 MG tablet Take 1  tablet (20 mg total) by mouth 2 (two) times daily for 5 days. Patient taking differently: Take 20 mg by mouth 2 (two) times daily as needed.  05/23/19 07/22/19 Yes Duffy Bruce, MD  hydrocortisone (CORTEF) 20 MG tablet Take 1 tablet (20 mg total) by mouth daily. 06/10/19  Yes Gladstone Lighter, MD  hydrOXYzine (ATARAX/VISTARIL) 10 MG tablet Take 1 tablet (10 mg total) by mouth 3 (three) times daily as needed. 06/13/19  Yes Cammie Sickle, MD  hyoscyamine (LEVBID) 0.375 MG 12 hr tablet Take 1 tablet (0.375 mg total)by mouth 2 (two) times daily. 01/03/19  Yes Vanga, Tally Due, MD  Multiple Vitamins-Minerals (MULTIVITAMIN WITH MINERALS) tablet Take 1 tablet by mouth daily. 08/01/18 08/01/19 Yes Wieting, Richard, MD  ondansetron (ZOFRAN ODT) 4 MG disintegrating tablet Take 1 tablet (4 mg total) by mouth every 8 (eight) hours as needed for nausea or vomiting. 07/08/19  Yes Cammie Sickle, MD  oxyCODONE (OXYCONTIN) 15 mg 12 hr tablet Take 1 tablet (15 mg total) by mouth every 12 (twelve) hours. 06/30/19  Yes Jacquelin Hawking, NP  Oxycodone HCl 10 MG TABS Take 1 tablet (10 mg total) by mouth every 6 (six) hours as needed (for breakthrough pain). 07/05/19  Yes Burns, Wandra Feinstein, NP  pantoprazole (PROTONIX) 40 MG tablet Take 1 tablet (40 mg total) by mouth daily. 03/18/19  Yes Cammie Sickle, MD  potassium chloride SA (K-DUR) 20 MEQ tablet Take 1 tablet (20 mEq total) by mouth daily as needed (if having nausea/vomiting- to supplements for lost potassium). 06/10/19  Yes Gladstone Lighter, MD  promethazine (PHENERGAN) 25 MG tablet Take 1 tablet (25 mg total) by mouth every 8 (eight) hours as needed for nausea or vomiting. 07/07/19  Yes Cammie Sickle, MD  rivaroxaban (XARELTO) 20 MG TABS tablet Take 1 tablet (20 mg total) by mouth daily with supper. Patient taking differently: Take 20 mg by mouth daily with supper. Takes in the morning 05/05/19  Yes Brahmanday, Elisha Headland, MD  VENTOLIN HFA  108 (90 Base) MCG/ACT inhaler Inhale 1-2 puffs into the lungs every 4 (four) hours as needed for shortness of breath. 02/19/19  Yes Verlon Au, NP      VITAL SIGNS:  Blood pressure 121/85, pulse 81, temperature 98.2 F (36.8 C), temperature source Oral, resp. rate 11, SpO2 95 %.  PHYSICAL EXAMINATION:  Physical Exam  GENERAL:  53 y.o.-year-old patient lying in bed in no acute distress.  EYES: Pupils equal, round, reactive to light and accommodation. No scleral icterus. Extraocular muscles intact.  HEENT: Head atraumatic, normocephalic. Oropharynx and nasopharynx clear. No oropharyngeal erythema, moist oral mucosa  NECK:  Supple,  no jugular venous distention. No thyroid enlargement, no tenderness.  LUNGS: Normal breath sounds bilaterally, no wheezing, rales, rhonchi. No use of accessory muscles of respiration.  CARDIOVASCULAR: S1, S2 RRR. No murmurs, rubs, gallops, clicks.  ABDOMEN: Soft, Tender in Lower abdomen but no rebound, rigidity, voluntary guarding, nondistended. Bowel sounds present. No organomegaly or mass.  EXTREMITIES: No pedal edema, cyanosis, or clubbing. + 2 pedal & radial pulses b/l.   NEUROLOGIC: Cranial nerves II through XII are intact. No focal Motor or sensory deficits appreciated b/l PSYCHIATRIC: The patient is alert and oriented x 3.  SKIN: No obvious rash, lesion, or ulcer.   LABORATORY PANEL:   CBC Recent Labs  Lab 07/22/19 1001  WBC 2.8*   2.9*  HGB 12.3   12.4  HCT 36.6   36.8  PLT 166   169   ------------------------------------------------------------------------------------------------------------------  Chemistries  Recent Labs  Lab 07/17/19 0817 07/22/19 1001  NA  --  136  K  --  3.8  CL  --  100  CO2  --  27  GLUCOSE  --  84  BUN  --  18  CREATININE  --  0.90  CALCIUM  --  9.2  MG 1.5*  --   AST  --  35  ALT  --  43  ALKPHOS  --  139*  BILITOT  --  0.5    ------------------------------------------------------------------------------------------------------------------  Cardiac Enzymes No results for input(s): TROPONINI in the last 168 hours. ------------------------------------------------------------------------------------------------------------------  RADIOLOGY:  Ct Abdomen Pelvis W Contrast  Result Date: 07/22/2019 CLINICAL DATA:  Constipation. No bowel movement in 3 days. History of bowel obstructions and bowel resection. EXAM: CT ABDOMEN AND PELVIS WITH CONTRAST TECHNIQUE: Multidetector CT imaging of the abdomen and pelvis was performed using the standard protocol following bolus administration of intravenous contrast. CONTRAST:  188m OMNIPAQUE IOHEXOL 300 MG/ML  SOLN COMPARISON:  06/21/2019 FINDINGS: Lower chest: Multiple lung base nodules, many cavitary. Mild overall increase in the size of pulmonary nodules. There is a cavitary nodule in the posteromedial right lower lobe, image 1, series 4, 12 mm, previously 9 mm. Left lower lobe nodule, image 4, series 4, 11 mm, previously 9.5 mm. Hepatobiliary: Liver normal in size and attenuation. No mass there is intrahepatic bile duct dilation similar to the prior study. Common bile duct is dilated to a maximum of 1.3 cm. It tapers distally. This is also stable. Status post cholecystectomy. Pancreas: Unremarkable. No pancreatic ductal dilatation or surrounding inflammatory changes. Spleen: Normal in size without focal abnormality. Adrenals/Urinary Tract: No adrenal masses. Kidneys normal in size, orientation and position. Symmetric renal enhancement and excretion. Tiny bilateral nonobstructing intrarenal stones. No renal masses. No hydronephrosis. Ureters normal in course and in caliber. Bladder is unremarkable. Stomach/Bowel: Status post partial right hemicolectomy. No evidence of bowel obstruction. No bowel wall thickening or inflammation. Moderate increased stool burden noted throughout the colon.  Stomach is unremarkable. Vascular/Lymphatic: Aortic atherosclerosis. No enlarged abdominal or pelvic lymph nodes. Reproductive: Questionable residual uterine tissue above the vaginal cuff measuring 3.3 x 3.1 x 3.5 cm. History reports cervical cancer with metastatic disease to the lung. It is unclear whether there has been a hysterectomy. This appearance is stable from the prior CT. No adnexal masses. Other: No abdominal wall hernia or abnormality. No abdominopelvic ascites. Musculoskeletal: No fracture or acute finding. No osteoblastic or osteolytic lesions. IMPRESSION: 1. No acute findings within the abdomen or pelvis. No evidence of bowel obstruction. 2. Moderate constipation with increased stool burden throughout the colon.  Stable changes from a partial right hemicolectomy. 3. Multiple lung base nodules, many cavitary, with some progression from the prior CT, several nodules having increased in size. 4. Soft tissue above the vaginal cuff, which may reflect residual uterine tissue. This is stable on multiple prior CTs. 5. Aortic atherosclerosis. 6. Chronic intra and extrahepatic bile duct dilation. Status post cholecystectomy. 7. Tiny nonobstructing intrarenal stones. Electronically Signed   By: Lajean Manes M.D.   On: 07/22/2019 11:40     IMPRESSION AND PLAN:   53 y.o. female with a known history of cervical cancer currently ongoing chemotherapy, history of hep C, chronic diarrhea, factor V Leiden mutation, anxiety, chronic pain, previous history of C. difficile who presents to the hospital complaining of abdominal pain.  1.  Abdominal pain- suspect to be secondary to constipation.  Patient normally has diarrhea but has not had a bowel movement in the past 3 to 4 days.  CT scan of the abdomen pelvis showing moderate constipation with increased stool burden. - We will place her on some MiraLAX and Colace as needed, will give a fleets enema. -Follow response.  Placed on clear liquid diet for now.  2.   History of cervical cancer currently ongoing chemotherapy-patient follows Dr. Rogue Bussing. - Continue her chronic pain medications given her OxyContin and oxycodone.  3.  GERD-continue Protonix.    4.  History of factor V Leiden mutation- continue Xarelto.  5.  History of chronic hypotension-continue Solu-Cortef.  6.  Depression-continue Elavil.   All the records are reviewed and case discussed with ED provider. Management plans discussed with the patient, family and they are in agreement.  CODE STATUS: Full code  TOTAL TIME TAKING CARE OF THIS PATIENT: 40 minutes.    Henreitta Leber M.D on 07/22/2019 at 4:26 PM  Between 7am to 6pm - Pager - [phone]  After 6pm go to [email or web address] - password [email or web address] [place] [facility]  Office  [phone]  CC: Primary care physician; Arnetha Courser, MD

## 2019-07-22 NOTE — ED Notes (Signed)
Dr Corky Downs notified pt having increased pain

## 2019-07-22 NOTE — ED Notes (Signed)
Admitting MD at bedside.

## 2019-07-22 NOTE — Telephone Encounter (Signed)
Thank you     Brenda

## 2019-07-22 NOTE — Telephone Encounter (Signed)
Patient called twice reporting that she is in pain and thinks she is obstructed as she has not had bowel movement in 3 days, the second call was to inform us that she is going to ER

## 2019-07-22 NOTE — ED Notes (Signed)
Patient transported to CT 

## 2019-07-22 NOTE — ED Triage Notes (Signed)
Pt arrived from home via [facility] c/o Abdominal pain. Pt was seen at this location for bowel obstruction 3 weeks ago. Pt is currently receiving tx for cancer. Pt states last BM last Thursday.

## 2019-07-22 NOTE — ED Provider Notes (Signed)
[facility] Emergency Department Provider Note   ____________________________________________    I have reviewed the triage vital signs and the nursing notes.   HISTORY  Chief Complaint Abdominal Pain     HPI Joanna Hall is a 53 y.o. female with a history of cervical cancer, receiving chemotherapy who presents with complaints of abdominal pain.  Patient reports typically she has copious diarrhea which stopped being able to have bowel movements several days ago which she notes is very atypical.  She developed nausea and abdominal pain developing overnight.  She reports this feels similar to bowel obstructions in the past.  Denies fevers or chills.  Does not take anything for this.  Treated by Dr. Rogue Bussing at cancer center.  Patient with a history of abdominal surgery  Past Medical History:  Diagnosis Date  . Abdominal pain 06/10/2018  . Abnormal cervical Papanicolaou smear 09/18/2017  . Anxiety   . Aortic atherosclerosis ([place])   . Arthritis    neck and knees  . 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 Blood transfusion without reported diagnosis   . Cervical cancer ([place]) 09/2016   mets lung  . Chronic anal fissure   . Chronic diarrhea   . Dyspnea   . Erosive gastropathy 09/18/2017  . Factor V Leiden mutation ([place])   . Fecal incontinence   . Genital warts   . GERD (gastroesophageal reflux disease)   . GI bleed 10/08/2018  . Heart murmur   . Hematochezia   . Hemorrhoids   . 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 Hepatitis, chronic ([place]) 05/05/2017  . History of cancer chemotherapy    completed 06/2017  . History of Clostridium difficile infection    while undergoing chemo.  Negative test 10/2017  . Ileocolic anastomotic leak   . 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 Intestinal infection due to Clostridium difficile 09/18/2017  . Macrocytic anemia with vitamin B12 deficiency    . Multiple gastric ulcers   . Nausea vomiting and diarrhea   . Pancolitis ([place]) 07/27/2018  . Perianal condylomata   . Pneumonia    History of  . Pulmonary nodules   . Rectal bleeding   . Small bowel obstruction ([place]) 08/2017  . Stiff neck    limited right turn  . Vitamin D deficiency     Patient Active Problem List   Diagnosis Date Noted  . Malnutrition of moderate degree 06/22/2019  . Small bowel obstruction due to adhesions ([place]) 06/21/2019  . Acute gastroenteritis 06/14/2019  . Intractable nausea and vomiting 06/07/2019  . Chest pain in adult 05/19/2019  . Intractable pain 05/19/2019  . Cancer associated pain   . Proctitis, radiation   . Palliative care encounter   . Encounter for monitoring opioid maintenance therapy 11/19/2018  . Adrenal insufficiency ([place]) 09/03/2018  . Chronic diarrhea 06/25/2018  . Chronic anticoagulation 06/25/2018  . Vomiting   . Encounter for antineoplastic chemotherapy 06/04/2018  . 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 Closed fracture of distal end of radius 05/04/2018  . History of total knee arthroplasty 04/24/2018  . Osteoarthritis of left knee 02/28/2018  . Condyloma acuminatum of anus s/p ablation 02/22/2018 02/22/2018  . Condyloma acuminatum of vagina s/p ablation 02/22/2018 02/22/2018  . Positive ANA (antinuclear antibody) 02/04/2018  . Aortic atherosclerosis ([place]) 01/15/2018  . Elevated  MCV 01/15/2018  . Anemia 01/15/2018  . Genital warts 01/15/2018  . Vitamin D deficiency 01/15/2018  . Pernicious anemia   . Impingement syndrome of shoulder region 12/19/2017  . Multiple joint pain 12/19/2017  . Osteoarthritis of right knee 12/19/2017  . B12 deficiency 12/11/2017  . Lung nodules 12/11/2017  . History of Clostridium difficile infection 10/23/2017  . Factor V Leiden ([place]) 10/19/2017  . Goals of care, counseling/discussion 10/19/2017  . Cervical cancer, FIGO stage IB1 ([place]) 09/18/2017  .  Chronic hepatitis C without hepatic coma ([place]) 09/18/2017  . Cytopenia 09/18/2017  . Diarrhea 09/18/2017  . Lower abdominal pain 09/18/2017  . 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 Malignant neoplasm of overlapping sites of cervix ([place]) 09/18/2017  . Renal insufficiency 09/18/2017  . Wound infection after surgery 09/12/2017  . Hypokalemia   . Hypomagnesemia   . Cervical arthritis 07/18/2017  . Dysuria 06/20/2017  . Metastatic cancer ([place]) 05/12/2017  . Essential hypertension 03/15/2017  . Anemia in other chronic diseases classified elsewhere 03/01/2017  . Chemotherapy-induced neutropenia ([place]) 01/29/2017  . 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 CHOLECYSTECTOMY    . COLON SURGERY  08/2017   resection  . COLONOSCOPY WITH PROPOFOL N/A 12/20/2017   Procedure: COLONOSCOPY WITH PROPOFOL;  Surgeon: [name], MD;  Location[facility] ENDOSCOPY;  Service: Gastroenterology;  Laterality: N/A;  . COLONOSCOPY WITH PROPOFOL N/A 07/30/2018   Procedure: COLONOSCOPY WITH PROPOFOL;  Surgeon: [name], MD;  Location: [facility] ENDOSCOPY;  Service: Gastroenterology;  Laterality: N/A;  . COLONOSCOPY WITH PROPOFOL N/A 10/10/2018   Procedure: COLONOSCOPY WITH PROPOFOL;  Surgeon: Lucilla Lame, MD;  Location: [facility] ENDOSCOPY;  Service: Endoscopy;  Laterality: N/A;  . DIAGNOSTIC LAPAROSCOPY    . ESOPHAGOGASTRODUODENOSCOPY (EGD) WITH PROPOFOL N/A 12/20/2017   Procedure: ESOPHAGOGASTRODUODENOSCOPY (EGD) WITH PROPOFOL;  Surgeon: [name], MD;  Location: [place];  Service: Gastroenterology;  Laterality: N/A;  . ESOPHAGOGASTRODUODENOSCOPY (EGD) WITH PROPOFOL  07/30/2018   Procedure: ESOPHAGOGASTRODUODENOSCOPY (EGD) WITH PROPOFOL;  Surgeon: [name], MD;  Location: [facility] ENDOSCOPY;  Service: Gastroenterology;;  . Otho Darner SIGMOIDOSCOPY N/A 11/21/2018   Procedure: FLEXIBLE SIGMOIDOSCOPY;  Surgeon: [name], MD;  Location: [facility] ENDOSCOPY;  Service: Gastroenterology;  Laterality: N/A;  . LAPAROTOMY N/A 08/31/2017   Procedure: EXPLORATORY LAPAROTOMY for SBO, ileocolectomy, removal of piece of uterine wall;  Surgeon: [name], MD;  Location: [facility] ORS;  Service: General;  Laterality: N/A;  . LASER ABLATION CONDOLAMATA N/A 02/22/2018   Procedure: LASER ABLATION/REMOVAL OF [id number] AROUND ANUS AND VAGINA;  Surgeon: Michael Boston, MD;  Location: [place];  Service: General;  Laterality: N/A;  . OOPHORECTOMY    . PORTA CATH INSERTION N/A 05/13/2018   Procedure: PORTA CATH INSERTION;  Surgeon: Katha Cabal, MD;  Location: [place] CV LAB;  Service: Cardiovascular;  Laterality: N/A;  . SMALL INTESTINE SURGERY    . TANDEM RING INSERTION     x3  . THORACOTOMY    . TOTAL KNEE ARTHROPLASTY Left 04/24/2018   Procedure: TOTAL KNEE ARTHROPLASTY;  Surgeon: Lovell Sheehan, MD;  Location: [facility] ORS;  Service: Orthopedics;  Laterality: Left;    Prior to Admission medications   Medication Sig Start Date End Date Taking? Authorizing Provider  amitriptyline (ELAVIL) 75 MG tablet Take 1 tablet (75 mg total) by mouth at bedtime. 05/06/19 08/04/19  [name], MD  Calcium Carb-Cholecalciferol (CALCIUM 500 +D) 500-400 MG-UNIT TABS Take 2  tablets by mouth daily.    [provider]  diphenoxylate-atropine (LOMOTIL) 2.5-0.025 MG tablet TAKE (2) TABLETS FOUR TIMES DAILY. 04/21/19   [name], MD  famotidine (PEPCID) 20 MG tablet Take 1 tablet (20 mg total) by mouth 2 (two) times daily for 5 days. 05/23/19 06/21/19  Duffy Bruce, MD  feeding supplement, ENSURE ENLIVE, (ENSURE ENLIVE) LIQD Take 237 mLs by mouth 3 (three) times daily between meals. 05/22/19   Epifanio Lesches, MD  hydrocortisone (CORTEF) 20 MG tablet Take 1 tablet (20 mg total) by mouth daily. 06/10/19   Gladstone Lighter, MD  hydrOXYzine (ATARAX/VISTARIL) 10 MG tablet Take 1 tablet (10 mg total)  by mouth 3 (three) times daily as needed. 06/13/19   Cammie Sickle, MD  hyoscyamine (LEVBID) 0.375 MG 12 hr tablet Take 1 tablet (0.375 mg total)by mouth 2 (two) times daily. 01/03/19   [name], MD  Multiple Vitamins-Minerals (MULTIVITAMIN WITH MINERALS) tablet Take 1 tablet by mouth daily. 08/01/18 08/01/19  Loletha Grayer, MD  ondansetron (ZOFRAN ODT) 4 MG disintegrating tablet Take 1 tablet (4 mg total) by mouth every 8 (eight) hours as needed for nausea or vomiting. 07/08/19   Cammie Sickle, MD  oxyCODONE (OXYCONTIN) 15 mg 12 hr tablet Take 1 tablet (15 mg total) by mouth every 12 (twelve) hours. 06/30/19   Jacquelin Hawking, NP  Oxycodone HCl 10 MG TABS Take 1 tablet (10 mg total) by mouth every 6 (six) hours as needed (for breakthrough pain). 07/05/19   Jacquelin Hawking, NP  pantoprazole (PROTONIX) 40 MG tablet Take 1 tablet (40 mg total) by mouth daily. 03/18/19   Cammie Sickle, MD  potassium chloride SA (K-DUR) 20 MEQ tablet Take 1 tablet (20 mEq total) by mouth daily as needed (if having nausea/vomiting- to supplements for lost potassium). 06/10/19   Gladstone Lighter, MD  promethazine (PHENERGAN) 25 MG tablet Take 1 tablet (25 mg total) by mouth every 8 (eight) hours as needed for nausea or vomiting. 07/07/19   Cammie Sickle, MD  rivaroxaban (XARELTO) 20 MG TABS tablet Take 1 tablet (20 mg total) by mouth daily with supper. Patient taking differently: Take 20 mg by mouth daily with supper. Takes in the morning 05/05/19   Cammie Sickle, MD  VENTOLIN HFA 108 (90 Base) MCG/ACT inhaler Inhale 1-2 puffs into the lungs every 4 (four) hours as needed for shortness of breath. 02/19/19   Verlon Au, NP     Allergies Ketamine  Family History  Problem Relation Age of Onset  . Hypertension Father   . Diabetes Father   . Alcohol abuse Daughter   . Hypertension Maternal Grandmother   . Diabetes Maternal Grandmother   . Diabetes Paternal Grandmother    . Hypertension Paternal Grandmother     Social History Social History   Tobacco Use  . Smoking status: Former Smoker    Quit date: 10/16/2006    Years since quitting: 12.7  . Smokeless tobacco: Never Used  Substance Use Topics  . Alcohol use: Not Currently    Frequency: Never    Comment: seldom  . Drug use: Yes    Types: Marijuana    Comment: not very often    Review of Systems  Constitutional: No fever/chills Eyes: No visual changes.  ENT: No sore throat. Cardiovascular: Denies chest pain. Respiratory: Denies shortness of breath. Gastrointestinal: As above Genitourinary: Negative for dysuria. Musculoskeletal: Negative for back pain. Skin: Negative for rash. Neurological: Negative for headaches  ____________________________________________   PHYSICAL EXAM:  VITAL SIGNS: ED Triage Vitals  Enc Vitals Group     BP 07/22/19 0938 (!) 135/93     Pulse Rate 07/22/19 0938 82     Resp 07/22/19 0938 17     Temp 07/22/19 0938 98.2 F (36.8 C)     Temp Source 07/22/19 0938 Oral     SpO2 07/22/19 0938 100 %     Weight --      Height --      Head Circumference --      Peak Flow --      Pain Score 07/22/19 0940 8     Pain Loc --      Pain Edu? --      Excl. in [place]? --     Constitutional: Alert and oriented.   Nose: No congestion/rhinnorhea. Mouth/Throat: Mucous membranes are moist.    Cardiovascular: Normal rate, regular rhythm. Grossly normal heart sounds.  Good peripheral circulation. Respiratory: Normal respiratory effort.  No retractions. Lungs CTAB. Gastrointestinal: Mild distention, tenderness in the lower abdomen, no CVA tenderness Musculoskeletal:  Warm and well perfused Neurologic:  Normal speech and language. No gross focal neurologic deficits are appreciated.  Skin:  Skin is warm, dry and intact. No rash noted. Psychiatric: Mood and affect are normal. Speech and behavior are normal.  ____________________________________________   LABS (all labs  ordered are listed, but only abnormal results are displayed)  Labs Reviewed  COMPREHENSIVE METABOLIC PANEL - Abnormal; Notable for the following components:      Result Value   Alkaline Phosphatase 139 (*)    All other components within normal limits  CBC - Abnormal; Notable for the following components:   WBC 2.9 (*)    RBC 3.73 (*)    All other components within normal limits  URINALYSIS, COMPLETE (UACMP) WITH MICROSCOPIC - Abnormal; Notable for the following components:   Color, Urine STRAW (*)    APPearance CLEAR (*)    All other components within normal limits  CBC - Abnormal; Notable for the following components:   WBC 2.8 (*)    RBC 3.72 (*)    All other components within normal limits  SARS CORONAVIRUS 2 (TAT 6-24 HRS)  LIPASE, BLOOD  DIFFERENTIAL   ____________________________________________  EKG  None ____________________________________________  RADIOLOGY  CT scan demonstrates constipation, no bowel obstruction ____________________________________________   PROCEDURES  Procedure(s) performed: No  Procedures   Critical Care performed:No ____________________________________________   INITIAL IMPRESSION / ASSESSMENT AND PLAN / ED COURSE  Pertinent labs & imaging results that were available during my care of the patient were reviewed by me and considered in my medical decision making (see chart for details).  Patient presents with abdominal pain as described above.  Strongly suspicious for bowel obstruction given her history.  We will give IV fluids, IV morphine, IV Zofran and report.  Will obtain CT abdomen pelvis  Patient had little improvement with morphine, given IV Dilaudid with significant improvement  CT scan is overall reassuring, no evidence of SBO, possible worsening of malignancy  We will observe the patient in the ED to determine admission discharge  Patient has had continued pain and is required an additional dose of IV Dilaudid.  I  will discuss with the hospitalist for admission for her continued abdominal pain in the setting of cancer   ____________________________________________   FINAL CLINICAL IMPRESSION(S) / ED DIAGNOSES  Final diagnoses:  Generalized abdominal pain        Note:  This  document was prepared using Systems analyst and may include unintentional dictation errors.   Lavonia Drafts, MD 07/22/19 1452

## 2019-07-23 ENCOUNTER — Inpatient Hospital Stay: Payer: Medicaid Other

## 2019-07-23 ENCOUNTER — Observation Stay: Payer: Medicaid Other

## 2019-07-23 DIAGNOSIS — D6851 Activated protein C resistance: Secondary | ICD-10-CM | POA: Diagnosis present

## 2019-07-23 DIAGNOSIS — Z833 Family history of diabetes mellitus: Secondary | ICD-10-CM | POA: Diagnosis not present

## 2019-07-23 DIAGNOSIS — I10 Essential (primary) hypertension: Secondary | ICD-10-CM | POA: Diagnosis present

## 2019-07-23 DIAGNOSIS — K5900 Constipation, unspecified: Secondary | ICD-10-CM | POA: Diagnosis present

## 2019-07-23 DIAGNOSIS — Z7901 Long term (current) use of anticoagulants: Secondary | ICD-10-CM | POA: Diagnosis not present

## 2019-07-23 DIAGNOSIS — I9589 Other hypotension: Secondary | ICD-10-CM | POA: Diagnosis present

## 2019-07-23 DIAGNOSIS — M47892 Other spondylosis, cervical region: Secondary | ICD-10-CM | POA: Diagnosis present

## 2019-07-23 DIAGNOSIS — R109 Unspecified abdominal pain: Secondary | ICD-10-CM | POA: Diagnosis present

## 2019-07-23 DIAGNOSIS — Z8249 Family history of ischemic heart disease and other diseases of the circulatory system: Secondary | ICD-10-CM | POA: Diagnosis not present

## 2019-07-23 DIAGNOSIS — Z87891 Personal history of nicotine dependence: Secondary | ICD-10-CM | POA: Diagnosis not present

## 2019-07-23 DIAGNOSIS — F329 Major depressive disorder, single episode, unspecified: Secondary | ICD-10-CM | POA: Diagnosis present

## 2019-07-23 DIAGNOSIS — G8929 Other chronic pain: Secondary | ICD-10-CM | POA: Diagnosis present

## 2019-07-23 DIAGNOSIS — Z20828 Contact with and (suspected) exposure to other viral communicable diseases: Secondary | ICD-10-CM | POA: Diagnosis present

## 2019-07-23 DIAGNOSIS — Z9221 Personal history of antineoplastic chemotherapy: Secondary | ICD-10-CM | POA: Diagnosis not present

## 2019-07-23 DIAGNOSIS — B1920 Unspecified viral hepatitis C without hepatic coma: Secondary | ICD-10-CM | POA: Diagnosis present

## 2019-07-23 DIAGNOSIS — K219 Gastro-esophageal reflux disease without esophagitis: Secondary | ICD-10-CM | POA: Diagnosis present

## 2019-07-23 DIAGNOSIS — E559 Vitamin D deficiency, unspecified: Secondary | ICD-10-CM | POA: Diagnosis present

## 2019-07-23 DIAGNOSIS — I700 Atherosclerosis of aorta: Secondary | ICD-10-CM | POA: Diagnosis present

## 2019-07-23 DIAGNOSIS — Z8541 Personal history of malignant neoplasm of cervix uteri: Secondary | ICD-10-CM | POA: Diagnosis not present

## 2019-07-23 DIAGNOSIS — R1084 Generalized abdominal pain: Secondary | ICD-10-CM | POA: Diagnosis present

## 2019-07-23 DIAGNOSIS — Z888 Allergy status to other drugs, medicaments and biological substances status: Secondary | ICD-10-CM | POA: Diagnosis not present

## 2019-07-23 DIAGNOSIS — Z96652 Presence of left artificial knee joint: Secondary | ICD-10-CM | POA: Diagnosis present

## 2019-07-23 LAB — CBC
HCT: 39.4 % (ref 36.0–46.0)
Hemoglobin: 13.2 g/dL (ref 12.0–15.0)
MCH: 32.9 pg (ref 26.0–34.0)
MCHC: 33.5 g/dL (ref 30.0–36.0)
MCV: 98.3 fL (ref 80.0–100.0)
Platelets: 152 10*3/uL (ref 150–400)
RBC: 4.01 MIL/uL (ref 3.87–5.11)
RDW: 14.3 % (ref 11.5–15.5)
WBC: 2.6 10*3/uL — ABNORMAL LOW (ref 4.0–10.5)
nRBC: 0 % (ref 0.0–0.2)

## 2019-07-23 LAB — GLUCOSE, CAPILLARY: Glucose-Capillary: 155 mg/dL — ABNORMAL HIGH (ref 70–99)

## 2019-07-23 LAB — BASIC METABOLIC PANEL
Anion gap: 11 (ref 5–15)
BUN: 12 mg/dL (ref 6–20)
CO2: 28 mmol/L (ref 22–32)
Calcium: 9.2 mg/dL (ref 8.9–10.3)
Chloride: 97 mmol/L — ABNORMAL LOW (ref 98–111)
Creatinine, Ser: 0.7 mg/dL (ref 0.44–1.00)
GFR calc Af Amer: >60 mL/min (ref >60)
GFR calc non Af Amer: >60 mL/min (ref >60)
Glucose, Bld: 101 mg/dL — ABNORMAL HIGH (ref 70–99)
Potassium: 3.9 mmol/L (ref 3.5–5.1)
Sodium: 136 mmol/L (ref 135–145)

## 2019-07-23 LAB — SARS CORONAVIRUS 2 (TAT 6-24 HRS): SARS Coronavirus 2: NEGATIVE

## 2019-07-23 MED ORDER — MORPHINE SULFATE (PF) 2 MG/ML IV SOLN
2.0000 mg | INTRAVENOUS | Status: DC | PRN
  Administered 2019-07-23: 07:00:00 4 mg via INTRAVENOUS
  Filled 2019-07-23: qty 2

## 2019-07-23 MED ORDER — PROCHLORPERAZINE EDISYLATE 10 MG/2ML IJ SOLN
10.0000 mg | Freq: Four times a day (QID) | INTRAMUSCULAR | Status: DC | PRN
  Administered 2019-07-23 (×2): 10 mg via INTRAVENOUS
  Filled 2019-07-23 (×4): qty 2

## 2019-07-23 MED ORDER — METHYLNALTREXONE BROMIDE 12 MG/0.6ML ~~LOC~~ SOLN
12.0000 mg | Freq: Once | SUBCUTANEOUS | Status: AC
  Administered 2019-07-23: 16:00:00 12 mg via SUBCUTANEOUS
  Filled 2019-07-23: qty 0.6

## 2019-07-23 MED ORDER — ENSURE ENLIVE PO LIQD: 237.0000 mL | Freq: Three times a day (TID) | ORAL | Status: DC

## 2019-07-23 MED ORDER — FLEET ENEMA 7-19 GM/118ML RE ENEM
1.0000 | ENEMA | Freq: Once | RECTAL | Status: AC
  Administered 2019-07-23: 19:00:00 1 via RECTAL

## 2019-07-23 NOTE — Progress Notes (Signed)
Initial Nutrition Assessment  RD working remotely.  DOCUMENTATION CODES:   Not applicable  INTERVENTION:  Provide Ensure Enlive po TID, each supplement provides 350 kcal and 20 grams of protein.  Continue daily MVI.  NUTRITION DIAGNOSIS:   Increased nutrient needs related to catabolic illness(metastatic adenocarcinoma of the cervix) as evidenced by estimated needs.  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   Spoke with patient over the phone. She is known to this RD from previous admissions. Patient reports her appetite is decreased from the abdominal pain. She is still eating small meals. Denies any nausea. She is amenable to drinking Ensure to help meet calorie/protein needs. Patient has recently met criteria for moderate chronic malnutrition. RD suspects patient is still malnourished but unable to confirm without completing NFPE.  Patient had been weight-stable at around 166 lbs. She reports she was told on admission here that she weighs 148 lbs, which she felt could not be right. Weight documented in chart is 78.1 kg (172.18 lbs).   Medications reviewed and include: Oscal with D 2 tablets daily, MVI daily, pantoprazole, oxycodone.  Labs reviewed: Chloride 97.  NUTRITION - FOCUSED PHYSICAL EXAM:  Unable to complete at this time.  Diet Order:   Diet Order            Diet Heart Room service appropriate? Yes; Fluid  consistency: Thin  Diet effective now             EDUCATION NEEDS:   No education needs have been identified at this time  Skin:  Skin Assessment: Reviewed RN Assessment  Last BM:  07/22/2019 - medium type 2  Height:   Ht Readings from Last 1 Encounters:  07/22/19 5' 8"  (1.727 m)   Weight:   Wt Readings from Last 1 Encounters:  07/22/19 78.1 kg   Ideal Body Weight:  63.6 kg  BMI:  Body mass index is 26.18 kg/m.  Estimated Nutritional Needs:   Kcal:  4034-7425  Protein:  90-100 grams  Fluid:  1.8-2 L/day  Willey Blade, MS, RD, LDN Office: [phone] Pager: [phone] After Hours/Weekend Pager: [phone]

## 2019-07-23 NOTE — Progress Notes (Signed)
[place] [facility]                                                                                                                                                                                  Patient Demographics   Joanna Hall, is a 53 y.o. female, DOB - January 22, 1966, DEY:[id number]  Admit date - 07/22/2019   Admitting Physician Henreitta Leber, MD  Outpatient Primary MD for the patient is Lada, Satira Anis, MD   LOS - 0  Subjective: Pt still having cramping    Review of Systems:   CONSTITUTIONAL: No documented fever. No fatigue, weakness. No weight gain, no weight loss.  EYES: No blurry or double vision.  ENT: No tinnitus. No postnasal drip. No redness of the oropharynx.  RESPIRATORY: No cough, no wheeze, no hemoptysis. No dyspnea.  CARDIOVASCULAR: No chest pain. No orthopnea. No palpitations. No syncope.  GASTROINTESTINAL: No nausea, no vomiting or diarrhea. + abdominal pain. No melena or hematochezia.  GENITOURINARY: No dysuria or hematuria.  ENDOCRINE: No polyuria or nocturia. No heat or cold intolerance.  HEMATOLOGY: No anemia. No bruising. No bleeding.  INTEGUMENTARY: No rashes. No lesions.  MUSCULOSKELETAL: No arthritis. No swelling. No gout.  NEUROLOGIC: No numbness, tingling, or ataxia. No seizure-type activity.  PSYCHIATRIC: No anxiety. No insomnia. No ADD.    Vitals:   Vitals:   07/22/19 1801 07/22/19 1903 07/23/19 0429 07/23/19 0812  BP:  (!) 157/92 127/89 121/84  Pulse:  85 83 97  Resp:  15 16 18   Temp:  98.5 F (36.9 C) 97.9 F (36.6 C) 98.3 F (36.8 C)  TempSrc:  Oral Oral Oral  SpO2:  99% 100% 98%  Weight: 78.1 kg     Height: 5' 8"  (1.727 m)       Wt Readings from Last 3 Encounters:  07/22/19 78.1 kg  07/10/19 73.9 kg  07/03/19 75.1 kg     Intake/Output Summary (Last 24 hours) at 07/23/2019 1419 Last data filed at 07/23/2019 1300 Gross per 24 hour  Intake 240 ml  Output 3 ml  Net 237 ml    Physical  Exam:   GENERAL: Pleasant-appearing in no apparent distress.  HEAD, EYES, EARS, NOSE AND THROAT: Atraumatic, normocephalic. Extraocular muscles are intact. Pupils equal and reactive to light. Sclerae anicteric. No conjunctival injection. No oro-pharyngeal erythema.  NECK: Supple. There is no jugular venous distention. No bruits, no lymphadenopathy, no thyromegaly.  HEART: Regular rate and rhythm,. No murmurs, no rubs, no clicks.  LUNGS: Clear to auscultation bilaterally. No rales or rhonchi. No wheezes.  ABDOMEN: Soft, flat, nontender, nondistended. Has good bowel sounds. No hepatosplenomegaly appreciated.  EXTREMITIES: No evidence  of any cyanosis, clubbing, or peripheral edema.  +2 pedal and radial pulses bilaterally.  NEUROLOGIC: The patient is alert, awake, and oriented x3 with no focal motor or sensory deficits appreciated bilaterally.  SKIN: Moist and warm with no rashes appreciated.  Psych: Not anxious, depressed LN: No inguinal LN enlargement    Antibiotics   Anti-infectives (From admission, onward)   None      Medications   Scheduled Meds: . amitriptyline  75 mg Oral QHS  . calcium-vitamin D  2 tablet Oral Q breakfast  . Chlorhexidine Gluconate Cloth  6 each Topical Daily  . feeding supplement (ENSURE ENLIVE)  237 mL Oral TID BM  . hydrocortisone  20 mg Oral Daily  . hyoscyamine  0.375 mg Oral Q12H  . methylnaltrexone  12 mg Subcutaneous Once  . multivitamin with minerals  1 tablet Oral Daily  . oxyCODONE  15 mg Oral Q12H  . pantoprazole  40 mg Oral Daily  . rivaroxaban  20 mg Oral Q supper   Continuous Infusions: PRN Meds:.acetaminophen **OR** acetaminophen, albuterol, diphenoxylate-atropine, docusate sodium, famotidine, hydrOXYzine, morphine injection, ondansetron **OR** ondansetron (ZOFRAN) IV, oxyCODONE, polyethylene glycol, prochlorperazine, promethazine   Data Review:   Micro Results Recent Results (from the past 240 hour(s))  SARS CORONAVIRUS 2 (TAT 6-24  HRS) Nasopharyngeal Nasopharyngeal Swab     Status: None   Collection Time: 07/22/19  2:07 PM   Specimen: Nasopharyngeal Swab  Result Value Ref Range Status   SARS Coronavirus 2 NEGATIVE NEGATIVE Final    Comment: (NOTE) SARS-CoV-2 target nucleic acids are NOT DETECTED. The SARS-CoV-2 RNA is generally detectable in upper and lower respiratory specimens during the acute phase of infection. Negative results do not preclude SARS-CoV-2 infection, do not rule out co-infections with other pathogens, and should not be used as the sole basis for treatment or other patient management decisions. Negative results must be combined with clinical observations, patient history, and epidemiological information. The expected result is Negative. Fact Sheet for Patients: SugarRoll.be Fact Sheet for Healthcare Providers: https://www.woods-mathews.com/ This test is not yet approved or cleared by the [place] FDA and  has been authorized for detection and/or diagnosis of SARS-CoV-2 by FDA under an Emergency Use Authorization (EUA). This EUA will remain  in effect (meaning this test can be used) for the duration of the COVID-19 declaration under Section 56 4(b)(1) of the Act, 21 U.S.C. section 360bbb-3(b)(1), unless the authorization is terminated or revoked sooner. Performed at [facility], [place] [address], [place], [place] [address]     Radiology Reports Abd 1 View (kub)  Result Date: 07/23/2019 CLINICAL DATA:  Constipation EXAM: ABDOMEN - 1 VIEW COMPARISON:  Abdominal CT from yesterday FINDINGS: Formed stool throughout the descending and sigmoid colon. Mild stool in the proximal colon. There has been modest evacuation since yesterday. No obstructive bowel dilatation. Right upper quadrant clips. Lumbar spine degeneration with levoscoliosis. Cavitary pulmonary nodules. IMPRESSION: Improving stool burden when compared to CT yesterday. Electronically  Signed   By: Monte Fantasia M.D.   On: 07/23/2019 07:22   Dg Abd 1 View  Result Date: 06/24/2019 CLINICAL DATA:  53 year old female with evidence of small bowel obstruction on CT several days ago. Metastatic cervical carcinoma. EXAM: ABDOMEN - 1 VIEW COMPARISON:  06/23/2019 and earlier. FINDINGS: Portable supine views at 0941 hours. Enteric tube has been removed. There is only trace residual oral contrast in the large bowel, at the splenic flexure and descending colon. Cholecystectomy clips and right abdominal staple line redemonstrated.  Non obstructed bowel gas pattern. Partially visible right chest port. Bilateral lung pulmonary nodules were better demonstrated by CT. Normal visible mediastinal contours. Degenerative changes in the spine. No acute osseous abnormality identified. IMPRESSION: 1. Continued non obstructed bowel gas pattern since yesterday. Minimal residual bowel contrast. Enteric tube removed. 2. Pulmonary metastases as demonstrated by CT. Electronically Signed   By: Genevie Ann M.D.   On: 06/24/2019 10:00   Ct Abdomen Pelvis W Contrast  Result Date: 07/22/2019 CLINICAL DATA:  Constipation. No bowel movement in 3 days. History of bowel obstructions and bowel resection. EXAM: CT ABDOMEN AND PELVIS WITH CONTRAST TECHNIQUE: Multidetector CT imaging of the abdomen and pelvis was performed using the standard protocol following bolus administration of intravenous contrast. CONTRAST:  164m OMNIPAQUE IOHEXOL 300 MG/ML  SOLN COMPARISON:  06/21/2019 FINDINGS: Lower chest: Multiple lung base nodules, many cavitary. Mild overall increase in the size of pulmonary nodules. There is a cavitary nodule in the posteromedial right lower lobe, image 1, series 4, 12 mm, previously 9 mm. Left lower lobe nodule, image 4, series 4, 11 mm, previously 9.5 mm. Hepatobiliary: Liver normal in size and attenuation. No mass there is intrahepatic bile duct dilation similar to the prior study. Common bile duct is dilated to a  maximum of 1.3 cm. It tapers distally. This is also stable. Status post cholecystectomy. Pancreas: Unremarkable. No pancreatic ductal dilatation or surrounding inflammatory changes. Spleen: Normal in size without focal abnormality. Adrenals/Urinary Tract: No adrenal masses. Kidneys normal in size, orientation and position. Symmetric renal enhancement and excretion. Tiny bilateral nonobstructing intrarenal stones. No renal masses. No hydronephrosis. Ureters normal in course and in caliber. Bladder is unremarkable. Stomach/Bowel: Status post partial right hemicolectomy. No evidence of bowel obstruction. No bowel wall thickening or inflammation. Moderate increased stool burden noted throughout the colon. Stomach is unremarkable. Vascular/Lymphatic: Aortic atherosclerosis. No enlarged abdominal or pelvic lymph nodes. Reproductive: Questionable residual uterine tissue above the vaginal cuff measuring 3.3 x 3.1 x 3.5 cm. History reports cervical cancer with metastatic disease to the lung. It is unclear whether there has been a hysterectomy. This appearance is stable from the prior CT. No adnexal masses. Other: No abdominal wall hernia or abnormality. No abdominopelvic ascites. Musculoskeletal: No fracture or acute finding. No osteoblastic or osteolytic lesions. IMPRESSION: 1. No acute findings within the abdomen or pelvis. No evidence of bowel obstruction. 2. Moderate constipation with increased stool burden throughout the colon. Stable changes from a partial right hemicolectomy. 3. Multiple lung base nodules, many cavitary, with some progression from the prior CT, several nodules having increased in size. 4. Soft tissue above the vaginal cuff, which may reflect residual uterine tissue. This is stable on multiple prior CTs. 5. Aortic atherosclerosis. 6. Chronic intra and extrahepatic bile duct dilation. Status post cholecystectomy. 7. Tiny nonobstructing intrarenal stones. Electronically Signed   By: DLajean ManesM.D.    On: 07/22/2019 11:40     CBC Recent Labs  Lab 07/22/19 1001 07/23/19 0937  WBC 2.8*  2.9* 2.6*  HGB 12.3  12.4 13.2  HCT 36.6  36.8 39.4  PLT 166  169 152  MCV 98.4  98.7 98.3  MCH 33.1  33.2 32.9  MCHC 33.6  33.7 33.5  RDW 14.0  14.1 14.3  LYMPHSABS 0.7  --   MONOABS 0.2  --   EOSABS 0.0  --   BASOSABS 0.0  --     Chemistries  Recent Labs  Lab 07/17/19 0817 07/22/19 1001 07/23/19 0937  NA  --  136 136  K  --  3.8 3.9  CL  --  100 97*  CO2  --  27 28  GLUCOSE  --  84 101*  BUN  --  18 12  CREATININE  --  0.90 0.70  CALCIUM  --  9.2 9.2  MG 1.5*  --   --   AST  --  35  --   ALT  --  43  --   ALKPHOS  --  139*  --   BILITOT  --  0.5  --    ------------------------------------------------------------------------------------------------------------------ estimated creatinine clearance is 91.4 mL/min (by C-G formula based on SCr of 0.7 mg/dL). ------------------------------------------------------------------------------------------------------------------ No results for input(s): HGBA1C in the last 72 hours. ------------------------------------------------------------------------------------------------------------------ No results for input(s): CHOL, HDL, LDLCALC, TRIG, CHOLHDL, LDLDIRECT in the last 72 hours. ------------------------------------------------------------------------------------------------------------------ No results for input(s): TSH, T4TOTAL, T3FREE, THYROIDAB in the last 72 hours.  Invalid input(s): FREET3 ------------------------------------------------------------------------------------------------------------------ No results for input(s): VITAMINB12, FOLATE, FERRITIN, TIBC, IRON, RETICCTPCT in the last 72 hours.  Coagulation profile No results for input(s): INR, PROTIME in the last 168 hours.  No results for input(s): DDIMER in the last 72 hours.  Cardiac Enzymes No results for input(s): CKMB, TROPONINI, MYOGLOBIN in the last  168 hours.  Invalid input(s): CK ------------------------------------------------------------------------------------------------------------------ Invalid input(s): POCBNP    Assessment & Plan  53 y.o. female with a known history of cervical cancer currently ongoing chemotherapy, history of hep C, chronic diarrhea, factor V Leiden mutation, anxiety, chronic pain, previous history of C. difficile who presents to the hospital complaining of abdominal pain.  1.  Abdominal pain- suspect to be secondary to constipation.  Pt continues to have constipation.  On chronic pain medications. We will give Relistor Continue Colace   2.  History of cervical cancer currently ongoing chemotherapy-patient follows Dr. Rogue Bussing. - Continue her chronic pain medications given her OxyContin and oxycodone.  3.  GERD-continue Protonix.    4.  History of factor V Leiden mutation- continue Xarelto.  5.  History of chronic hypotension-continue Solu-Cortef.  6.  Depression-continue Elavil.       Code Status Orders  (From admission, onward)         Start     Ordered   07/22/19 1658  Full code  Continuous     07/22/19 1658        Code Status History    Date Active Date Inactive Code Status Order ID Comments User Context   06/21/2019 1305 06/25/2019 2037 Full Code [id number]  Lang Snow, NP ED   06/14/2019 0418 06/15/2019 1902 Full Code [id number]  Mansy, Arvella Merles, MD ED   06/07/2019 1536 06/10/2019 2044 Full Code [id number]  Sela Hua, MD Inpatient   05/19/2019 0313 05/22/2019 1545 Full Code [id number]  Mayer Camel, NP Inpatient   11/19/2018 1803 11/26/2018 2238 Full Code [id number]  Nicholes Mango, MD Inpatient   10/17/2018 1910 10/18/2018 2119 Full Code [id number]  Hillary Bow, MD Inpatient   10/08/2018 1443 10/11/2018 1920 Full Code [id number]  Nicholes Mango, MD Inpatient   07/27/2018 1621 08/01/2018 1432 Full Code [id number]  Demetrios Loll, MD Inpatient   06/10/2018 1754 06/12/2018 1614 Full  Code [id number]  Dustin Flock, MD Inpatient   04/24/2018 1451 04/27/2018 1700 Full Code [id number]  Lovell Sheehan, MD Inpatient   12/03/2017 1619 12/08/2017 1606 Full Code [id number]  Epifanio Lesches, MD ED   09/12/2017 0024 09/15/2017 1721 Full Code [id number]  [name], MD ED   08/27/2017 207 085 2294 09/05/2017  1355 Full Code [id number]  Herbert Pun, MD Inpatient   Advance Care Planning Activity           Consults none  DVT Prophylaxis Xarelto  Lab Results  Component Value Date   PLT 152 07/23/2019     Time Spent in minutes   90mn  Greater than 50% of time spent in care coordination and counseling patient regarding the condition and plan of care.   SDustin FlockM.D on 07/23/2019 at 2:19 PM  Between 7am to 6pm - Pager - [phone]  After 6pm go to [email or web address] - pProofreader [employer]   Office  [phone]

## 2019-07-24 ENCOUNTER — Encounter: Payer: Self-pay | Admitting: Internal Medicine

## 2019-07-24 ENCOUNTER — Inpatient Hospital Stay: Payer: Medicaid Other

## 2019-07-24 ENCOUNTER — Other Ambulatory Visit: Payer: Self-pay | Admitting: *Deleted

## 2019-07-24 ENCOUNTER — Other Ambulatory Visit: Payer: Self-pay

## 2019-07-24 ENCOUNTER — Encounter: Payer: Self-pay | Admitting: Nurse Practitioner

## 2019-07-24 DIAGNOSIS — C538 Malignant neoplasm of overlapping sites of cervix uteri: Secondary | ICD-10-CM

## 2019-07-24 NOTE — TOC Initial Note (Signed)
Transition of Care [facility]) - Initial/Assessment Note    Patient Details  Name: Joanna Hall MRN: [id number] Date of Birth: 1965/12/26  [facility] of [facility]) CM/SW Contact:    Shelbie Hutching, RN Phone Number: 07/24/2019, 10:09 AM  Clinical Narrative:                 Patient admitted for abdominal pain, history of cervical cancer and chronic pain.  Patient is well known to [facility] with frequent admissions.  Patient is closely followed by oncology.  Patient does not have any discharge needs at this time.    Expected Discharge Plan: Home/Self Care Barriers to Discharge: Continued Medical Work up   Patient Goals and CMS Choice        Expected Discharge Plan and Services Expected Discharge Plan: Home/Self Care                                              Prior Living Arrangements/Services   Lives with:: Parents   Do you feel safe going back to the place where you live?: Yes      Need for Family Participation in Patient Care: Yes (Comment)(chronic pain, cancer) Care giver support system in place?: Yes (comment)(parents)   Criminal Activity/Legal Involvement Pertinent to Current Situation/Hospitalization: No - Comment as needed  Activities of Daily Living Home Assistive Devices/Equipment: None ADL Screening (condition at time of admission) Patient's cognitive ability adequate to safely complete daily activities?: Yes Is the patient deaf or have difficulty hearing?: No Does the patient have difficulty seeing, even when wearing glasses/contacts?: No Does the patient have difficulty concentrating, remembering, or making decisions?: No Patient able to express need for assistance with ADLs?: Yes Does the patient have difficulty dressing or bathing?: Yes Independently performs ADLs?: Yes (appropriate for developmental age) Does the patient have difficulty walking or climbing stairs?: Yes(gets very tired and SOB) Weakness of Legs: Both Weakness of Arms/Hands:  Both  Permission Sought/Granted Permission sought to share information with : Case Manager                Emotional Assessment Appearance:: Appears stated age   Affect (typically observed): Accepting Orientation: : Oriented to Self, Oriented to Place, Oriented to  Time, Oriented to Situation Alcohol / Substance Use: Not Applicable Psych Involvement: No (comment)  Admission diagnosis:  Generalized abdominal pain [R10.84] Patient Active Problem List   Diagnosis Date Noted  . Abdominal pain 07/23/2019  . Constipation 07/22/2019  . Malnutrition of moderate degree 06/22/2019  . Small bowel obstruction due to adhesions ([place]) 06/21/2019  . Acute gastroenteritis 06/14/2019  . Intractable nausea and vomiting 06/07/2019  . Chest pain in adult 05/19/2019  . Intractable pain 05/19/2019  . Cancer associated pain   . Proctitis, radiation   . Palliative care encounter   . Encounter for monitoring opioid maintenance therapy 11/19/2018  . Adrenal insufficiency ([place]) 09/03/2018  . Chronic diarrhea 06/25/2018  . Chronic anticoagulation 06/25/2018  . Vomiting   . Encounter for antineoplastic chemotherapy 06/04/2018  . 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 Lung metastasis ([place]) 05/15/2018  . Closed fracture of distal end of radius 05/04/2018  . History of total knee arthroplasty 04/24/2018  . Osteoarthritis of left knee 02/28/2018  . Condyloma acuminatum of anus s/p ablation 02/22/2018 02/22/2018  . Condyloma acuminatum of vagina s/p ablation 02/22/2018 02/22/2018  . Positive ANA (antinuclear antibody)  02/04/2018  . Aortic atherosclerosis ([place]) 01/15/2018  . Elevated MCV 01/15/2018  . Anemia 01/15/2018  . Genital warts 01/15/2018  . Vitamin D deficiency 01/15/2018  . Pernicious anemia   . Impingement syndrome of shoulder region 12/19/2017  . Multiple joint pain 12/19/2017  . Osteoarthritis of right knee 12/19/2017  . B12 deficiency 12/11/2017  . Lung nodules 12/11/2017  . History  of Clostridium difficile infection 10/23/2017  . Factor V Leiden ([place]) 10/19/2017  . Goals of care, counseling/discussion 10/19/2017  . Cervical cancer, FIGO stage IB1 ([place]) 09/18/2017  . Chronic hepatitis C without hepatic coma ([place]) 09/18/2017  . Cytopenia 09/18/2017  . Diarrhea 09/18/2017  . Lower abdominal pain 09/18/2017  . 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 Malignant neoplasm of overlapping sites of cervix ([place]) 09/18/2017  . Renal insufficiency 09/18/2017  . Wound infection after surgery 09/12/2017  . Hypokalemia   . Hypomagnesemia   . Cervical arthritis 07/18/2017  . Dysuria 06/20/2017  . Metastatic cancer ([place]) 05/12/2017  . Essential hypertension 03/15/2017  . Anemia in other chronic diseases classified elsewhere 03/01/2017  . Chemotherapy-induced neutropenia ([place]) 01/29/2017  . 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[place]) 09/25/2016   PCP:  Arnetha Courser, MD Pharmacy:   [place], [place] [address] Phone: [phone] Fax: [phone]  [facility] [address]), [place]  [address] Phone: [phone] Fax: [phone]     Social Determinants of Health (SDOH) Interventions    Readmission Risk Interventions Readmission Risk Prevention Plan 06/25/2019 06/24/2019  Transportation Screening Complete Complete  Medication Review ([place]) Complete Complete  PCP or Specialist appointment within 3-5 days of discharge (No Data) -  Palliative Care Screening Not Applicable -  [place] Not Applicable Not Applicable  Some recent data might be hidden

## 2019-07-24 NOTE — Progress Notes (Signed)
Discharge instructions reviewed with patient. Patient denies questions at this time. Port de-accessed. Patient's father providing transportation home. Patient refused w/c escort at this time.

## 2019-07-24 NOTE — Discharge Summary (Signed)
[facility] NAME: Joanna Hall    MR#:  [id number]  DATE OF BIRTH:  11-19-1965  DATE OF ADMISSION:  07/22/2019 ADMITTING PHYSICIAN: Henreitta Leber, MD  DATE OF DISCHARGE: 07/24/2019 11:30 AM  PRIMARY CARE PHYSICIAN: Arnetha Courser, MD    ADMISSION DIAGNOSIS:  Generalized abdominal pain [R10.84]  DISCHARGE DIAGNOSIS:  Active Problems:   Constipation   Abdominal pain   SECONDARY DIAGNOSIS:   Past Medical History:  Diagnosis Date  . Abdominal pain 06/10/2018  . Abnormal cervical Papanicolaou smear 09/18/2017  . Anxiety   . Aortic atherosclerosis ([place])   . Arthritis    neck and knees  . 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 Blood transfusion without reported diagnosis   . Cervical cancer ([place]) 09/2016   mets lung  . Chronic anal fissure   . Chronic diarrhea   . Dyspnea   . Erosive gastropathy 09/18/2017  . Factor V Leiden mutation ([place])   . Fecal incontinence   . Genital warts   . GERD (gastroesophageal reflux disease)   . GI bleed 10/08/2018  . Heart murmur   . Hematochezia   . Hemorrhoids   . 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 Hepatitis, chronic ([place]) 05/05/2017  . History of cancer chemotherapy    completed 06/2017  . History of Clostridium difficile infection    while undergoing chemo.  Negative test 10/2017  . Ileocolic anastomotic leak   . 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 Intestinal infection due to Clostridium difficile 09/18/2017  . Macrocytic anemia with vitamin B12 deficiency   . Multiple gastric ulcers   . Nausea vomiting and diarrhea   . Pancolitis ([place]) 07/27/2018  . Perianal condylomata   . Pneumonia    History of  . Pulmonary nodules   . Rectal bleeding   . Small bowel obstruction ([place]) 08/2017  . Stiff neck    limited right turn  . Vitamin D deficiency     HOSPITAL COURSE:   53 y.o. female with a known history  of cervical cancer currently ongoing chemotherapy, history of hep C, chronic diarrhea, factor V Leiden mutation, anxiety, chronic pain, previous history of C. difficile who presents to the hospital complaining of abdominal pain.  1.  Abdominal pain- secondary to constipation.  Patient normally has diarrhea but had not had a bowel movement in the past 3 to 4 days prior to admission.  CT scan of the abdomen pelvis showing moderate constipation with increased stool burden. -Patient was admitted to the hospital given some stool softeners and also given a fleets enema. -Patient had multiple soft bowel movements and constipation has resolved and feels much better.  2.  History of cervical cancer currently ongoing chemotherapy-patient follows Dr. Rogue Bussing. - Continue her chronic pain medications with her OxyContin and oxycodone. - cont follow up with Oncology as outpatient.   3.  GERD-continue Protonix.    4.  History of factor V Leiden mutation- continue Xarelto.  5.  History of chronic hypotension- continue Solu-Cortef.  6.  Depression- continue Elavil.  Stable for discharge.   DISCHARGE CONDITIONS:   Stable.   CONSULTS OBTAINED:    DRUG ALLERGIES:   Allergies  Allergen Reactions  . Ketamine Anxiety and Other (See Comments)    Syncope episode/confusion     DISCHARGE MEDICATIONS:   Allergies  as of 07/24/2019      Reactions   Ketamine Anxiety, Other (See Comments)   Syncope episode/confusion      Medication List    TAKE these medications   amitriptyline 75 MG tablet Commonly known as: ELAVIL Take 1 tablet (75 mg total) by mouth at bedtime.   Calcium 500 +D 500-400 MG-UNIT Tabs Generic drug: Calcium Carb-Cholecalciferol Take 2 tablets by mouth daily.   diphenoxylate-atropine 2.5-0.025 MG tablet Commonly known as: LOMOTIL TAKE (2) TABLETS FOUR TIMES DAILY.   famotidine 20 MG tablet Commonly known as: PEPCID Take 1 tablet (20 mg total) by mouth 2 (two) times  daily for 5 days. What changed:   when to take this  reasons to take this   hydrocortisone 20 MG tablet Commonly known as: CORTEF Take 1 tablet (20 mg total) by mouth daily.   hydrOXYzine 10 MG tablet Commonly known as: ATARAX/VISTARIL Take 1 tablet (10 mg total) by mouth 3 (three) times daily as needed.   hyoscyamine 0.375 MG 12 hr tablet Commonly known as: LEVBID Take 1 tablet (0.375 mg total)by mouth 2 (two) times daily.   multivitamin with minerals tablet Take 1 tablet by mouth daily.   ondansetron 4 MG disintegrating tablet Commonly known as: Zofran ODT Take 1 tablet (4 mg total) by mouth every 8 (eight) hours as needed for nausea or vomiting.   oxyCODONE 15 mg 12 hr tablet Commonly known as: OXYCONTIN Take 1 tablet (15 mg total) by mouth every 12 (twelve) hours.   Oxycodone HCl 10 MG Tabs Take 1 tablet (10 mg total) by mouth every 6 (six) hours as needed (for breakthrough pain).   pantoprazole 40 MG tablet Commonly known as: PROTONIX Take 1 tablet (40 mg total) by mouth daily.   potassium chloride SA 20 MEQ tablet Commonly known as: KLOR-CON Take 1 tablet (20 mEq total) by mouth daily as needed (if having nausea/vomiting- to supplements for lost potassium).   promethazine 25 MG tablet Commonly known as: PHENERGAN Take 1 tablet (25 mg total) by mouth every 8 (eight) hours as needed for nausea or vomiting.   rivaroxaban 20 MG Tabs tablet Commonly known as: XARELTO Take 1 tablet (20 mg total) by mouth daily with supper. What changed: additional instructions   Ventolin HFA 108 (90 Base) MCG/ACT inhaler Generic drug: albuterol Inhale 1-2 puffs into the lungs every 4 (four) hours as needed for shortness of breath.         DISCHARGE INSTRUCTIONS:   DIET:  Cardiac diet  DISCHARGE CONDITION:  Stable  ACTIVITY:  Activity as tolerated  OXYGEN:  Home Oxygen: No.   Oxygen Delivery: room air  DISCHARGE LOCATION:  home   If you experience worsening  of your admission symptoms, develop shortness of breath, life threatening emergency, suicidal or homicidal thoughts you must seek medical attention immediately by calling 911 or calling your MD immediately  if symptoms less severe.  You Must read complete instructions/literature along with all the possible adverse reactions/side effects for all the Medicines you take and that have been prescribed to you. Take any new Medicines after you have completely understood and accpet all the possible adverse reactions/side effects.   Please note  You were cared for by a hospitalist during your hospital stay. If you have any questions about your discharge medications or the care you received while you were in the hospital after you are discharged, you can call the unit and asked to speak with the hospitalist on call if the hospitalist  that took care of you is not available. Once you are discharged, your primary care physician will handle any further medical issues. Please note that NO REFILLS for any discharge medications will be authorized once you are discharged, as it is imperative that you return to your primary care physician (or establish a relationship with a primary care physician if you do not have one) for your aftercare needs so that they can reassess your need for medications and monitor your lab values.     Today   Abdominal pain much improved and resolved.  Patient had multiple soft bowel movements yesterday and today.  Will discharge home today.  VITAL SIGNS:  Blood pressure 116/82, pulse (!) 109, temperature 98.5 F (36.9 C), temperature source Oral, resp. rate 18, height 5' 8"  (1.727 m), weight 78.1 kg, SpO2 100 %.  I/O:    Intake/Output Summary (Last 24 hours) at 07/24/2019 1559 Last data filed at 07/24/2019 0900 Gross per 24 hour  Intake 480 ml  Output -  Net 480 ml    PHYSICAL EXAMINATION:   GENERAL:  53 y.o.-year-old patient lying in the bed with no acute distress.  EYES:  Pupils equal, round, reactive to light and accommodation. No scleral icterus. Extraocular muscles intact.  HEENT: Head atraumatic, normocephalic. Oropharynx and nasopharynx clear.  NECK:  Supple, no jugular venous distention. No thyroid enlargement, no tenderness.  LUNGS: Normal breath sounds bilaterally, no wheezing, rales,rhonchi. No use of accessory muscles of respiration.  CARDIOVASCULAR: S1, S2 normal. No murmurs, rubs, or gallops.  ABDOMEN: Soft, non-tender, non-distended. Bowel sounds present. No organomegaly or mass.  EXTREMITIES: No pedal edema, cyanosis, or clubbing.  NEUROLOGIC: Cranial nerves II through XII are intact. No focal motor or sensory defecits b/l.  PSYCHIATRIC: The patient is alert and oriented x 3.  SKIN: No obvious rash, lesion, or ulcer.   DATA REVIEW:   CBC Recent Labs  Lab 07/23/19 0937  WBC 2.6*  HGB 13.2  HCT 39.4  PLT 152    Chemistries  Recent Labs  Lab 07/22/19 1001 07/23/19 0937  NA 136 136  K 3.8 3.9  CL 100 97*  CO2 27 28  GLUCOSE 84 101*  BUN 18 12  CREATININE 0.90 0.70  CALCIUM 9.2 9.2  AST 35  --   ALT 43  --   ALKPHOS 139*  --   BILITOT 0.5  --     Cardiac Enzymes No results for input(s): TROPONINI in the last 168 hours.  Microbiology Results  Results for orders placed or performed during the hospital encounter of 07/22/19  SARS CORONAVIRUS 2 (TAT 6-24 HRS) Nasopharyngeal Nasopharyngeal Swab     Status: None   Collection Time: 07/22/19  2:07 PM   Specimen: Nasopharyngeal Swab  Result Value Ref Range Status   SARS Coronavirus 2 NEGATIVE NEGATIVE Final    Comment: (NOTE) SARS-CoV-2 target nucleic acids are NOT DETECTED. The SARS-CoV-2 RNA is generally detectable in upper and lower respiratory specimens during the acute phase of infection. Negative results do not preclude SARS-CoV-2 infection, do not rule out co-infections with other pathogens, and should not be used as the sole basis for treatment or other patient  management decisions. Negative results must be combined with clinical observations, patient history, and epidemiological information. The expected result is Negative. Fact Sheet for Patients: SugarRoll.be Fact Sheet for Healthcare Providers: https://www.woods-mathews.com/ This test is not yet approved or cleared by the [place] FDA and  has been authorized for detection and/or diagnosis of SARS-CoV-2 by  FDA under an Emergency Use Authorization (EUA). This EUA will remain  in effect (meaning this test can be used) for the duration of the COVID-19 declaration under Section 56 4(b)(1) of the Act, 21 U.S.C. section 360bbb-3(b)(1), unless the authorization is terminated or revoked sooner. Performed at [facility], [place] [address] [place], [place] [address]    *Note: Due to a large number of results and/or encounters for the requested time period, some results have not been displayed. A complete set of results can be found in Results Review.    RADIOLOGY:  Abd 1 View (kub)  Result Date: 07/23/2019 CLINICAL DATA:  Constipation EXAM: ABDOMEN - 1 VIEW COMPARISON:  Abdominal CT from yesterday FINDINGS: Formed stool throughout the descending and sigmoid colon. Mild stool in the proximal colon. There has been modest evacuation since yesterday. No obstructive bowel dilatation. Right upper quadrant clips. Lumbar spine degeneration with levoscoliosis. Cavitary pulmonary nodules. IMPRESSION: Improving stool burden when compared to CT yesterday. Electronically Signed   By: Monte Fantasia M.D.   On: 07/23/2019 07:22      Management plans discussed with the patient, family and they are in agreement.  CODE STATUS:  Code Status History    Date Active Date Inactive Code Status Order ID Comments User Context   07/22/2019 1658 07/24/2019 1436 Full Code [id number]  Henreitta Leber, MD Inpatient    TOTAL TIME TAKING CARE OF THIS PATIENT: 40 minutes.     Henreitta Leber M.D on 07/24/2019 at 3:59 PM  Between 7am to 6pm - Pager - [phone]  After 6pm go to [email or web address] - Proofreader  [facility]  Office  [phone]  CC: Primary care physician; Arnetha Courser, MD

## 2019-07-25 ENCOUNTER — Inpatient Hospital Stay: Payer: Medicaid Other

## 2019-07-25 ENCOUNTER — Other Ambulatory Visit: Payer: Self-pay

## 2019-07-25 ENCOUNTER — Telehealth: Payer: Self-pay

## 2019-07-25 ENCOUNTER — Inpatient Hospital Stay (HOSPITAL_BASED_OUTPATIENT_CLINIC_OR_DEPARTMENT_OTHER): Payer: Medicaid Other | Admitting: Nurse Practitioner

## 2019-07-25 VITALS — BP 128/90 | HR 100 | Temp 97.4°F | Resp 13

## 2019-07-25 VITALS — BP 128/90 | HR 100 | Temp 98.0°F | Resp 18

## 2019-07-25 DIAGNOSIS — R5381 Other malaise: Secondary | ICD-10-CM | POA: Diagnosis not present

## 2019-07-25 DIAGNOSIS — R5382 Chronic fatigue, unspecified: Secondary | ICD-10-CM

## 2019-07-25 DIAGNOSIS — C530 Malignant neoplasm of endocervix: Secondary | ICD-10-CM | POA: Diagnosis present

## 2019-07-25 DIAGNOSIS — Z90722 Acquired absence of ovaries, bilateral: Secondary | ICD-10-CM | POA: Diagnosis not present

## 2019-07-25 DIAGNOSIS — K5900 Constipation, unspecified: Secondary | ICD-10-CM | POA: Diagnosis not present

## 2019-07-25 DIAGNOSIS — Z87891 Personal history of nicotine dependence: Secondary | ICD-10-CM | POA: Diagnosis not present

## 2019-07-25 DIAGNOSIS — Z7901 Long term (current) use of anticoagulants: Secondary | ICD-10-CM | POA: Diagnosis not present

## 2019-07-25 DIAGNOSIS — Z95828 Presence of other vascular implants and grafts: Secondary | ICD-10-CM

## 2019-07-25 DIAGNOSIS — C7801 Secondary malignant neoplasm of right lung: Secondary | ICD-10-CM | POA: Diagnosis not present

## 2019-07-25 DIAGNOSIS — Z79899 Other long term (current) drug therapy: Secondary | ICD-10-CM | POA: Diagnosis not present

## 2019-07-25 DIAGNOSIS — Z923 Personal history of irradiation: Secondary | ICD-10-CM | POA: Diagnosis not present

## 2019-07-25 DIAGNOSIS — Z86718 Personal history of other venous thrombosis and embolism: Secondary | ICD-10-CM | POA: Diagnosis not present

## 2019-07-25 DIAGNOSIS — Z9221 Personal history of antineoplastic chemotherapy: Secondary | ICD-10-CM | POA: Diagnosis not present

## 2019-07-25 DIAGNOSIS — C538 Malignant neoplasm of overlapping sites of cervix uteri: Secondary | ICD-10-CM

## 2019-07-25 DIAGNOSIS — B1920 Unspecified viral hepatitis C without hepatic coma: Secondary | ICD-10-CM | POA: Diagnosis not present

## 2019-07-25 DIAGNOSIS — Z9114 Patient's other noncompliance with medication regimen: Secondary | ICD-10-CM | POA: Diagnosis not present

## 2019-07-25 DIAGNOSIS — Z86711 Personal history of pulmonary embolism: Secondary | ICD-10-CM | POA: Diagnosis not present

## 2019-07-25 DIAGNOSIS — C7800 Secondary malignant neoplasm of unspecified lung: Secondary | ICD-10-CM

## 2019-07-25 DIAGNOSIS — C7802 Secondary malignant neoplasm of left lung: Secondary | ICD-10-CM | POA: Diagnosis not present

## 2019-07-25 DIAGNOSIS — E860 Dehydration: Secondary | ICD-10-CM

## 2019-07-25 LAB — BASIC METABOLIC PANEL
Anion gap: 9 (ref 5–15)
BUN: 18 mg/dL (ref 6–20)
CO2: 25 mmol/L (ref 22–32)
Calcium: 9.2 mg/dL (ref 8.9–10.3)
Chloride: 100 mmol/L (ref 98–111)
Creatinine, Ser: 0.84 mg/dL (ref 0.44–1.00)
GFR calc Af Amer: >60 mL/min (ref >60)
GFR calc non Af Amer: >60 mL/min (ref >60)
Glucose, Bld: 92 mg/dL (ref 70–99)
Potassium: 4.1 mmol/L (ref 3.5–5.1)
Sodium: 134 mmol/L — ABNORMAL LOW (ref 135–145)

## 2019-07-25 LAB — CBC WITH DIFFERENTIAL/PLATELET
Abs Immature Granulocytes: 0.06 10*3/uL (ref 0.00–0.07)
Basophils Absolute: 0 10*3/uL (ref 0.0–0.1)
Basophils Relative: 0 %
Eosinophils Absolute: 0 10*3/uL (ref 0.0–0.5)
Eosinophils Relative: 1 %
HCT: 40.6 % (ref 36.0–46.0)
Hemoglobin: 13.6 g/dL (ref 12.0–15.0)
Immature Granulocytes: 1 %
Lymphocytes Relative: 17 %
Lymphs Abs: 0.8 10*3/uL (ref 0.7–4.0)
MCH: 33 pg (ref 26.0–34.0)
MCHC: 33.5 g/dL (ref 30.0–36.0)
MCV: 98.5 fL (ref 80.0–100.0)
Monocytes Absolute: 0.4 10*3/uL (ref 0.1–1.0)
Monocytes Relative: 9 %
Neutro Abs: 3.6 10*3/uL (ref 1.7–7.7)
Neutrophils Relative %: 72 %
Platelets: 170 10*3/uL (ref 150–400)
RBC: 4.12 MIL/uL (ref 3.87–5.11)
RDW: 14.2 % (ref 11.5–15.5)
WBC: 5 10*3/uL (ref 4.0–10.5)
nRBC: 0 % (ref 0.0–0.2)

## 2019-07-25 LAB — MAGNESIUM: Magnesium: 1.7 mg/dL (ref 1.7–2.4)

## 2019-07-25 MED ORDER — HEPARIN SOD (PORK) LOCK FLUSH 100 UNIT/ML IV SOLN
500.0000 [IU] | Freq: Once | INTRAVENOUS | Status: AC
  Administered 2019-07-25: 500 [IU] via INTRAVENOUS

## 2019-07-25 MED ORDER — HEPARIN SOD (PORK) LOCK FLUSH 100 UNIT/ML IV SOLN
INTRAVENOUS | Status: AC
  Filled 2019-07-25: qty 5

## 2019-07-25 MED ORDER — SODIUM CHLORIDE 0.9% FLUSH
10.0000 mL | Freq: Once | INTRAVENOUS | Status: AC
  Administered 2019-07-25: 10 mL via INTRAVENOUS
  Filled 2019-07-25: qty 10

## 2019-07-25 MED ORDER — SODIUM CHLORIDE 0.9 % IV SOLN
Freq: Once | INTRAVENOUS | Status: AC
  Administered 2019-07-25: 12:00:00 via INTRAVENOUS
  Filled 2019-07-25: qty 250

## 2019-07-25 NOTE — Progress Notes (Signed)
1125: Per Dr. Rogue Bussing, no need for Magnesium or potassium at this time. Pt reports fatigue. Pt to receive 1 liter NS over one hour per Dr. Rogue Bussing.

## 2019-07-25 NOTE — Progress Notes (Signed)
Symptom Management [place]  [phone] [phone]  Patient Care Team: Arnetha Courser, MD as PCP - General (Family Medicine) Mellody Drown, MD as Consulting Physician (Obstetrics and Gynecology) [name], MD as Consulting Physician (Gastroenterology) Michael Boston, MD as Consulting Physician (General Surgery) Lovell Sheehan, MD as Consulting Physician (Orthopedic Surgery) Cammie Sickle, MD as Consulting Physician (Oncology) Earnestine Leys, MD as Consulting Physician (Orthopedic Surgery)   Name of the patient: Joanna Hall  [id number]  1966/04/26   Date of visit: 07/25/19  Diagnosis-metastatic cervical cancer  Chief complaint/ Reason for visit-constipation and malaise  Heme/Onc history:  Oncology History Overview Note  # dec 2017- CERVICAL ADENO CA [[place]]; Stage IB [Dr. Christene Slates at [facility] in [place], [place];No surgery- Chemo-RT;  # 2018-sep - lung nodules [in [place]]  # AUG 20th, 2019-#1 Carbo-Taxol s/p 6 cycles- [No avastin sec to blood clots]: Feb 2020-bilateral lung nodules with 1 cm in size. March 2020- finished carbo-taxol #7; poor tolerance to [place].  # April 15 th 2020- Start Taxol weekly x 3; one week OFF;  # July 2020-CT- bil cavitary lesions- slightly bigger by few mm/overall STABLE; CT a/p-NED. 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  She received treatment from 11/2016-05/2017.  01/2017 cisplatin x 2 and carboplatin x 1 (01/29/2017)  due to ARF and XRT.  XRT was followed by T & O on 02/01/2017 and T & N 02/10/2017 and 02/20/2017.  Course was complicated by 80 pound weight loss, nausea, vomiting, electrolyte wasting (potassium and magnesium).  She describes that.  Is been sick constantly requiring at least 20 hospitalizations.  Follow-up CT chest and PET on 06/2017. Per patient, 'radiation worked' and no disease in the abdomen.  At that time she was noted to have lung nodules that were growing and follow-up imaging was scheduled for 10/2017.  She was admitted to hospital in [place] for small bowel obstruction which was managed conservatively and she was home for a week prior to traveling to [place] for Thanksgiving holiday where she has family.  She presented to ER in [place] on 08/2017 with nausea, vomiting, and lower abdominal pain.  Symptoms did not respond to conservative treatment.   CT on 08/26/2017 revealed small bowel obstruction with transition in the pelvis just superior to the uterus rather was a long segment of distal ileum with fatty wall thickening compatible with chronic inflammation and/or radiation enteritis. Imaging showed numerous pulmonary nodules consistent with metastatic disease. She underwent laparotomy and right ileocolectomy on 08/31/2017 at [facility].  Surgical findings revealed a thickened, matted, and scarred piece of distal small bowel close to the ileocecal valve.  She was discharged on 09/05/2017.  Pain markedly increased in intensity and imaging was performed on 09/11/2017 which revealed: Debris within the anterior abdominal wall incision concerning for infection versus packing material, s/p post ileo-colectomy with expected postoperative changes, mild colonic ileus, numerous pulmonary nodules highly concerning for metastatic disease, punctate nonobstructing nephrolithiasis.  Staples were removed and one was packed.  She was started on doxycycline.  Abdominal and pelvic CT without contrast on  09/11/2017 revealed debris within anterior abdominal wall incision concerning for infection, versus packing material.   She is s/p ileocolectomy with expected postoperative changes and mild colonic ileus.  There were numerous pulmonary nodules highly concerning for metastatic disease and punctate nonobstructing nephrolithiasis. She was readmitted on 09/12/2017.  She describes the onset of lower abdominal pain on 09/09/2017.  Pain markedly increased in intensity on 09/11/2017.   Staples were removed and the wound packed.  She was started on doxycycline.  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   PET scan was ordered to evaluate enlarging lung nodules with concern for recurrent cervical cancer but scan was delayed due to insurance and need to be performed in [place].  Presented to ER on 12/03/2017 for abdominal pain and emesis.  Imaging concerning for worsening possible uterine infarct and she was admitted to hospital.  Pelvic MRI was unremarkable.  Remote scarring type changes of uterus thought to be possibly related to radiation.  She was discharged on 12/08/2017.  Underwent endoscopy and colonoscopy on 12/20/2017.    On 02/22/2018 she underwent laser ablation of condylomata around the anus and vagina under anesthesia with Dr. Johney Maine.   04/15/2018:  Chest, abdomen, and pelvis CT revealed innumerable (> 100) cavitary nodules scattered in the lungs, moderately enlarging compared to the 11/08/2017 PET-CT, suspicious for metastatic disease.  One index node in the RLL measures 1.0 x 1.1 cm (previously 0.6 x 0.6 cm).  There were  no new nodules.  There was an ill-defined wall thickening in the rectosigmoid with surrounding stranding along fascia planes, probably sequela from prior radiation therapy.  There was multilevel lumbar impingement due to spondylosis and degenerative disc disease.  There was heterogeneous enhancement in the uterus (some possibly from prior radiation therapy).   04/23/2018:  PET scan revealed numerous scattered solid and cavitary nodules in the lungs stable increased in size compared to the prior PET-CT from 11/08/2017. Largest nodule was 1.1 cm in the LUL (SUV 1.9).  These demonstrated low-grade metabolic activity up to a maximum SUV of about 2.3, increased from 11/08/2017.    Case was discussed at tumor board on 04/25/2018. Consensus to pursue CT-guided biopsy (05/06/18) which revealed: Metastatic adenocarcinoma, morphologically consistent with cervical adenocarcinoma.  She has history of chronic hepatitis C which is managed by GI.  Hepatitis C genotype is 2a/2c.  She receives B12 injections for history of B12 deficiency.  On 04/24/2018 she underwent left total knee replacement with Dr. [name]. --------------------------------------------------------   DIAGNOSIS: Cervical adenocarcinoma  STAGE: IV        ;GOALS: Palliative  CURRENT/MOST RECENT THERAPY : Keytruda    Malignant neoplasm of overlapping sites of cervix ([place])  01/29/2019 - 04/23/2019 Chemotherapy   The patient had PACLitaxel (TAXOL) 156 mg in sodium chloride 0.9 % 250 mL chemo infusion (</= 31m/m2), 80 mg/m2 = 156 mg, Intravenous,  Once, 3 of 4 cycles Dose modification: 65 mg/m2 (original dose 80 mg/m2, Cycle 1, Reason: Provider Judgment) Administration: 156 mg (01/29/2019), 156 mg (02/05/2019), 126 mg (02/12/2019), 126 mg (02/26/2019), 126 mg (03/13/2019), 126 mg (03/27/2019), 126 mg (03/20/2019), 126 mg (04/03/2019), 126 mg (04/10/2019)  for chemotherapy treatment.  05/08/2019 -  Chemotherapy   The patient had pembrolizumab (KEYTRUDA) 200 mg  in sodium chloride 0.9 % 50 mL chemo infusion, 200 mg, Intravenous, Once, 4 of 6 cycles Administration: 200 mg (05/08/2019), 200 mg (05/29/2019), 200 mg (06/19/2019), 200 mg (07/10/2019)  for chemotherapy treatment.      Interval history-Joanna Hall, 53 year old female with history of metastatic cervical cancer with ongoing chemotherapy, history of hepatitis C, factor V Leiden mutation, chronic pain, who presents to symptom management clinic for reports of malaise and constipation.  She was discharged from hospital yesterday after admission for abdominal pain secondary to constipation.  At baseline, she has chronic diarrhea but had a 3-day history of constipation.  Imaging showed moderate constipation with increased stool burden.  She was admitted to the hospital and given stool softener and fleets enema.  She had multiple soft bowel movements and constipation was resolved.  Abdominal pain resolved.  Constipation complicated by chronic pain secondary to metastatic cervical cancer.  She takes OxyContin and oxycodone chronically for pain secondary to her malignancy.  She says she has not taken anything for constipation today.  Has chronic diarrhea.  She denies neurologic complaints.  Denies recent fever or illness.  Denies easy bleeding or bruising.  Appetite is stable.  Denies weight loss.  Denies chest pain, nausea, vomiting, or urinary complaints.  ECOG FS:1 - Symptomatic but completely ambulatory  Review of systems- Review of Systems  Constitutional: Positive for malaise/fatigue. Negative for chills, fever and weight loss.  HENT: Negative for hearing loss, nosebleeds, sore throat and tinnitus.   Eyes: Negative for blurred vision and double vision.  Respiratory: Negative for cough, hemoptysis, shortness of breath and wheezing.   Cardiovascular: Negative for chest pain, palpitations and leg swelling.  Gastrointestinal: Negative for abdominal pain, blood in stool, constipation, diarrhea, melena,  nausea and vomiting.  Genitourinary: Negative for dysuria and urgency.  Musculoskeletal: Negative for back pain, falls, joint pain and myalgias.  Skin: Negative for itching and rash.  Neurological: Negative for dizziness, tingling, sensory change, loss of consciousness, weakness and headaches.  Endo/Heme/Allergies: Negative for environmental allergies. Does not bruise/bleed easily.  Psychiatric/Behavioral: Negative for depression. The patient is not nervous/anxious and does not have insomnia.     Current treatment-palliative Keytruda-last on 07/10/2019  Past medical history, past surgical history, and past family history reviewed and updated as below.  Allergies  Allergen Reactions   Ketamine Anxiety and Other (See Comments)    Syncope episode/confusion     Past Medical History:  Diagnosis Date   Abdominal pain 06/10/2018   Abnormal cervical Papanicolaou smear 09/18/2017   Anxiety    Aortic atherosclerosis (HCC)    Arthritis    neck and knees   Blood clots in brain    both lungs and right kidney   Blood transfusion without reported diagnosis    Cervical cancer ([place]) 09/2016   mets lung   Chronic anal fissure    Chronic diarrhea    Dyspnea    Erosive gastropathy 09/18/2017   Factor V Leiden mutation ([place])    Fecal incontinence    Genital warts    GERD (gastroesophageal reflux disease)    GI bleed 10/08/2018   Heart murmur    Hematochezia    Hemorrhoids    Hepatitis C    Chronic, 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   Intestinal infection due to Clostridium difficile 09/18/2017   Macrocytic anemia with vitamin B12 deficiency    Multiple gastric ulcers    Nausea vomiting  and diarrhea    Pancolitis ([place]) 07/27/2018   Perianal condylomata    Pneumonia    History of   Pulmonary nodules    Rectal bleeding    Small bowel obstruction ([place]) 08/2017   Stiff neck    limited right turn   Vitamin D deficiency     Past Surgical History:  Procedure Laterality Date   CHOLECYSTECTOMY     COLON SURGERY  08/2017   resection   COLONOSCOPY WITH PROPOFOL N/A 12/20/2017   Procedure: COLONOSCOPY WITH PROPOFOL;  Surgeon: [name], MD;  Location: [facility];  Service: Gastroenterology;  Laterality: N/A;   COLONOSCOPY WITH PROPOFOL N/A 07/30/2018   Procedure: COLONOSCOPY WITH PROPOFOL;  Surgeon: [name], MD;  Location: [facility] ENDOSCOPY;  Service: Gastroenterology;  Laterality: N/A;   COLONOSCOPY WITH PROPOFOL N/A 10/10/2018   Procedure: COLONOSCOPY WITH PROPOFOL;  Surgeon: Lucilla Lame, MD;  Location: [facility] ENDOSCOPY;  Service: Endoscopy;  Laterality: N/A;   DIAGNOSTIC LAPAROSCOPY     ESOPHAGOGASTRODUODENOSCOPY (EGD) WITH PROPOFOL N/A 12/20/2017   Procedure: ESOPHAGOGASTRODUODENOSCOPY (EGD) WITH PROPOFOL;  Surgeon: [name], MD;  Location: [facility] ENDOSCOPY;  Service: Gastroenterology;  Laterality: N/A;   ESOPHAGOGASTRODUODENOSCOPY (EGD) WITH PROPOFOL  07/30/2018   Procedure: ESOPHAGOGASTRODUODENOSCOPY (EGD) WITH PROPOFOL;  Surgeon: [name], MD;  Location: [facility] ENDOSCOPY;  Service: Gastroenterology;;   [place] N/A 11/21/2018   Procedure: FLEXIBLE SIGMOIDOSCOPY;  Surgeon: [name], MD;  Location: [facility] ENDOSCOPY;  Service: Gastroenterology;  Laterality: N/A;   LAPAROTOMY N/A 08/31/2017   Procedure: EXPLORATORY LAPAROTOMY for SBO, ileocolectomy, removal of piece of uterine wall;  Surgeon: [name], MD;  Location: [facility] ORS;  Service: General;  Laterality: N/A;   LASER ABLATION CONDOLAMATA N/A 02/22/2018   Procedure: LASER ABLATION/REMOVAL OF [id number] AROUND ANUS AND VAGINA;  Surgeon:  Michael Boston, MD;  Location: [facility];  Service: General;  Laterality: N/A;   OOPHORECTOMY     PORTA CATH INSERTION N/A 05/13/2018   Procedure: PORTA CATH INSERTION;  Surgeon: Katha Cabal, MD;  Location: [facility] CV LAB;  Service: Cardiovascular;  Laterality: N/A;   SMALL INTESTINE SURGERY     TANDEM RING INSERTION     x3   THORACOTOMY     TOTAL KNEE ARTHROPLASTY Left 04/24/2018   Procedure: TOTAL KNEE ARTHROPLASTY;  Surgeon: Lovell Sheehan, MD;  Location: [facility] ORS;  Service: Orthopedics;  Laterality: Left;    Social History   Socioeconomic History   Marital status: Divorced    Spouse name: Not on file   Number of children: Not on file   Years of education: Not on file   Highest education level: Not on file  Occupational History   Not on file  Social Needs   Financial resource strain: Not hard at all   Food insecurity    Worry: Never true    Inability: Never true   Transportation needs    Medical: No    Non-medical: No  Tobacco Use   Smoking status: Former Smoker    Packs/day: 0.25    Years: 10.00    Pack years: 2.50    Types: Cigarettes    Quit date: 10/16/2006  Years since quitting: 12.7   Smokeless tobacco: Never Used  Substance and Sexual Activity   Alcohol use: Not Currently    Frequency: Never    Comment: seldom   Drug use: Yes    Types: Marijuana    Comment: not very often   Sexual activity: Not Currently    Birth control/protection: Post-menopausal    Comment: Not Asked  Lifestyle   Physical activity    Days per week: Patient refused    Minutes per session: Patient refused   Stress: Only a little  Relationships   Press photographer on phone: Patient refused    Gets together: Patient refused    Attends religious service: Patient refused    Active member of club or organization: Patient refused    Attends meetings of clubs or organizations: Patient refused    Relationship status: Patient  refused   Intimate partner violence    Fear of current or ex partner: No    Emotionally abused: No    Physically abused: No    Forced sexual activity: No  Other Topics Concern   Not on file  Social History Narrative   Not on file    Family History  Problem Relation Age of Onset   Hypertension Father    Diabetes Father    Alcohol abuse Daughter    Hypertension Maternal Grandmother    Diabetes Maternal Grandmother    Diabetes Paternal Grandmother    Hypertension Paternal Grandmother      Current Outpatient Medications:    amitriptyline (ELAVIL) 75 MG tablet, Take 1 tablet (75 mg total) by mouth at bedtime., Disp: 90 tablet, Rfl: 1   Calcium Carb-Cholecalciferol (CALCIUM 500 +D) 500-400 MG-UNIT TABS, Take 2 tablets by mouth daily., Disp: , Rfl:    diphenoxylate-atropine (LOMOTIL) 2.5-0.025 MG tablet, TAKE (2) TABLETS FOUR TIMES DAILY., Disp: 240 tablet, Rfl: 0   famotidine (PEPCID) 20 MG tablet, Take 1 tablet (20 mg total) by mouth 2 (two) times daily for 5 days. (Patient taking differently: Take 20 mg by mouth 2 (two) times daily as needed. ), Disp: 10 tablet, Rfl: 0   hydrocortisone (CORTEF) 20 MG tablet, Take 1 tablet (20 mg total) by mouth daily., Disp: 30 tablet, Rfl: 0   hydrOXYzine (ATARAX/VISTARIL) 10 MG tablet, Take 1 tablet (10 mg total) by mouth 3 (three) times daily as needed., Disp: 60 tablet, Rfl: 0   hyoscyamine (LEVBID) 0.375 MG 12 hr tablet, Take 1 tablet (0.375 mg total)by mouth 2 (two) times daily., Disp: 60 tablet, Rfl: 0   Multiple Vitamins-Minerals (MULTIVITAMIN WITH MINERALS) tablet, Take 1 tablet by mouth daily., Disp: 30 tablet, Rfl: 0   ondansetron (ZOFRAN ODT) 4 MG disintegrating tablet, Take 1 tablet (4 mg total) by mouth every 8 (eight) hours as needed for nausea or vomiting., Disp: 30 tablet, Rfl: 2   oxyCODONE (OXYCONTIN) 15 mg 12 hr tablet, Take 1 tablet (15 mg total) by mouth every 12 (twelve) hours., Disp: 60 tablet, Rfl: 0    Oxycodone HCl 10 MG TABS, Take 1 tablet (10 mg total) by mouth every 6 (six) hours as needed (for breakthrough pain)., Disp: 120 tablet, Rfl: 0   pantoprazole (PROTONIX) 40 MG tablet, Take 1 tablet (40 mg total) by mouth daily., Disp: 90 tablet, Rfl: 1   potassium chloride SA (K-DUR) 20 MEQ tablet, Take 1 tablet (20 mEq total) by mouth daily as needed (if having nausea/vomiting- to supplements for lost potassium)., Disp: 30 tablet, Rfl: 0  promethazine (PHENERGAN) 25 MG tablet, Take 1 tablet (25 mg total) by mouth every 8 (eight) hours as needed for nausea or vomiting., Disp: 30 tablet, Rfl: 0   rivaroxaban (XARELTO) 20 MG TABS tablet, Take 1 tablet (20 mg total) by mouth daily with supper. (Patient taking differently: Take 20 mg by mouth daily with supper. Takes in the morning), Disp: 30 tablet, Rfl: 6   VENTOLIN HFA 108 (90 Base) MCG/ACT inhaler, Inhale 1-2 puffs into the lungs every 4 (four) hours as needed for shortness of breath., Disp: 1 Inhaler, Rfl: 1 No current facility-administered medications for this visit.   Facility-Administered Medications Ordered in Other Visits:    0.9 %  sodium chloride infusion, , Intravenous, Once, Cammie Sickle, MD, Last Rate: 999 mL/hr at 07/25/19 1130   heparin lock flush 100 unit/mL, 500 Units, Intravenous, Once, Corcoran, Melissa C, MD   sodium chloride 0.9 % 1,000 mL with potassium chloride 20 mEq infusion, , Intravenous, Once, Honor Loh E, NP   sodium chloride flush (NS) 0.9 % injection 10 mL, 10 mL, Intravenous, Once, Borders, Kirt Boys, NP  Physical exam:  Vitals:   07/25/19 1130  BP: 128/90  Pulse: 100  Resp: 13  Temp: (!) 97.4 F (36.3 C)     Physical Exam Constitutional:      General: She is not in acute distress.    Appearance: She is well-developed.     Comments: Well appearing. Seen in infusion suite while receiving IV fluids. Wearing mask.  HENT:     Head: Atraumatic.     Nose: Nose normal.     Mouth/Throat:      Pharynx: No oropharyngeal exudate.  Eyes:     General: No scleral icterus.    Conjunctiva/sclera: Conjunctivae normal.  Neck:     Musculoskeletal: Normal range of motion.  Cardiovascular:     Rate and Rhythm: Normal rate and regular rhythm.  Pulmonary:     Effort: Pulmonary effort is normal.     Breath sounds: Normal breath sounds.  Abdominal:     General: There is no distension.     Tenderness: There is no guarding.  Musculoskeletal: Normal range of motion.        General: No deformity.     Comments: Ambulates without aids  Skin:    General: Skin is warm and dry.  Neurological:     Mental Status: She is alert and oriented to person, place, and time.     Gait: Gait normal.  Psychiatric:        Mood and Affect: Mood normal.        Behavior: Behavior normal.      CMP Latest Ref Rng & Units 07/25/2019  Glucose 70 - 99 mg/dL 92  BUN 6 - 20 mg/dL 18  Creatinine 0.44 - 1.00 mg/dL 0.84  Sodium 135 - 145 mmol/L 134(L)  Potassium 3.5 - 5.1 mmol/L 4.1  Chloride 98 - 111 mmol/L 100  CO2 22 - 32 mmol/L 25  Calcium 8.9 - 10.3 mg/dL 9.2  Total Protein 6.5 - 8.1 g/dL -  Total Bilirubin 0.3 - 1.2 mg/dL -  Alkaline Phos 38 - 126 U/L -  AST 15 - 41 U/L -  ALT 0 - 44 U/L -   CBC Latest Ref Rng & Units 07/25/2019  WBC 4.0 - 10.5 K/uL 5.0  Hemoglobin 12.0 - 15.0 g/dL 13.6  Hematocrit 36.0 - 46.0 % 40.6  Platelets 150 - 400 K/uL 170    No images  are attached to the encounter.  Abd 1 View (kub)  Result Date: 07/23/2019 CLINICAL DATA:  Constipation EXAM: ABDOMEN - 1 VIEW COMPARISON:  Abdominal CT from yesterday FINDINGS: Formed stool throughout the descending and sigmoid colon. Mild stool in the proximal colon. There has been modest evacuation since yesterday. No obstructive bowel dilatation. Right upper quadrant clips. Lumbar spine degeneration with levoscoliosis. Cavitary pulmonary nodules. IMPRESSION: Improving stool burden when compared to CT yesterday. Electronically Signed   By:  Monte Fantasia M.D.   On: 07/23/2019 07:22   Ct Abdomen Pelvis W Contrast  Result Date: 07/22/2019 CLINICAL DATA:  Constipation. No bowel movement in 3 days. History of bowel obstructions and bowel resection. EXAM: CT ABDOMEN AND PELVIS WITH CONTRAST TECHNIQUE: Multidetector CT imaging of the abdomen and pelvis was performed using the standard protocol following bolus administration of intravenous contrast. CONTRAST:  169m OMNIPAQUE IOHEXOL 300 MG/ML  SOLN COMPARISON:  06/21/2019 FINDINGS: Lower chest: Multiple lung base nodules, many cavitary. Mild overall increase in the size of pulmonary nodules. There is a cavitary nodule in the posteromedial right lower lobe, image 1, series 4, 12 mm, previously 9 mm. Left lower lobe nodule, image 4, series 4, 11 mm, previously 9.5 mm. Hepatobiliary: Liver normal in size and attenuation. No mass there is intrahepatic bile duct dilation similar to the prior study. Common bile duct is dilated to a maximum of 1.3 cm. It tapers distally. This is also stable. Status post cholecystectomy. Pancreas: Unremarkable. No pancreatic ductal dilatation or surrounding inflammatory changes. Spleen: Normal in size without focal abnormality. Adrenals/Urinary Tract: No adrenal masses. Kidneys normal in size, orientation and position. Symmetric renal enhancement and excretion. Tiny bilateral nonobstructing intrarenal stones. No renal masses. No hydronephrosis. Ureters normal in course and in caliber. Bladder is unremarkable. Stomach/Bowel: Status post partial right hemicolectomy. No evidence of bowel obstruction. No bowel wall thickening or inflammation. Moderate increased stool burden noted throughout the colon. Stomach is unremarkable. Vascular/Lymphatic: Aortic atherosclerosis. No enlarged abdominal or pelvic lymph nodes. Reproductive: Questionable residual uterine tissue above the vaginal cuff measuring 3.3 x 3.1 x 3.5 cm. History reports cervical cancer with metastatic disease to the  lung. It is unclear whether there has been a hysterectomy. This appearance is stable from the prior CT. No adnexal masses. Other: No abdominal wall hernia or abnormality. No abdominopelvic ascites. Musculoskeletal: No fracture or acute finding. No osteoblastic or osteolytic lesions. IMPRESSION: 1. No acute findings within the abdomen or pelvis. No evidence of bowel obstruction. 2. Moderate constipation with increased stool burden throughout the colon. Stable changes from a partial right hemicolectomy. 3. Multiple lung base nodules, many cavitary, with some progression from the prior CT, several nodules having increased in size. 4. Soft tissue above the vaginal cuff, which may reflect residual uterine tissue. This is stable on multiple prior CTs. 5. Aortic atherosclerosis. 6. Chronic intra and extrahepatic bile duct dilation. Status post cholecystectomy. 7. Tiny nonobstructing intrarenal stones. Electronically Signed   By: DLajean ManesM.D.   On: 07/22/2019 11:40    Assessment and plan- Patient is a 53y.o. female diagnosed with metastatic cervical cancer who presents to symptom management clinic for malaise and constipation.  Metastatic cervical cancer-patient has had poor tolerance to treatment.  Currently on Keytruda with palliative intent.  Imaging in hospital showed mild overall increase in size of pulmonary nodules concerning for progressive disease.  Discussed with Dr. BRogue Bussingwho recommends proceeding with scheduled CT chest on 07/29/2019. Discussed with patient who is in agreement.  Malaise-likely secondary to chronic disease.  Labs today overall improved.  No evidence of anemia.  Electrolytes within normal limits which is improved. IV fluids in clinic today based on patient preference.   Constipation-etiology unclear- chronic opiate use.  No evidence of obstruction on imaging.  Reluctant to start patient on stool softeners given history of chronic diarrhea and poor control of chronic  diarrhea.  Discussed starting MiraLAX once a day and increasing physical activity, fluids, and fiber intake.  If no bowel movement for greater than 48 hours, notify clinic.  Patient is in agreement with this plan and is concerned for recurrent diarrhea.   Disposition:  CT Chest as scheduled. Follow-up with Dr.Brahmanday as scheduled on 07/31/2019.  Return to clinic if symptoms do not improve or worsen in the interim.    Visit Diagnosis 1. Malignant neoplasm metastatic to lung, unspecified laterality ([place])   2. Chronic fatigue and malaise   3. Constipation, unspecified constipation type     Patient expressed understanding and was in agreement with this plan. She also understands that She can call clinic at any time with any questions, concerns, or complaints.   Thank you for allowing me to participate in the care of your patient.   Beckey Rutter, DNP, AGNP-C [facility] [place] (work cell) [phone] (office)  CC: Dr. Rogue Bussing

## 2019-07-25 NOTE — Telephone Encounter (Signed)
Attempted to reach patient for TCM call and confirm hospital follow up appt. Left msg for pt to call me directly at [phone] or contact the office.

## 2019-07-28 ENCOUNTER — Encounter: Payer: Self-pay | Admitting: Oncology

## 2019-07-28 ENCOUNTER — Other Ambulatory Visit: Payer: Self-pay | Admitting: Oncology

## 2019-07-28 NOTE — Telephone Encounter (Signed)
Patient called [facility] requesting refill of oxycodone and OxyContin.  As mandated by the [place] STOP Act (Strengthen Opioid Misuse Prevention), the  Controlled Substance Reporting System ([place]) was reviewed for this patient.  Per registry, patient received refill of medication on 07/05/2019 and 06/30/2019 as below. Patient was also recently in hospital.     I called patient to clarify her message that she was out of medication. She says that she has medication and is taking it as prescribed.  Advised patient that it is too early for me to send a refill of oxycodone and to please request at a later date.  We will send OxyContin refill postdated.  Oxycodone refill appropriate on 08/05/2019.

## 2019-07-29 ENCOUNTER — Encounter: Payer: Self-pay | Admitting: Nurse Practitioner

## 2019-07-29 ENCOUNTER — Ambulatory Visit: Payer: Medicaid Other

## 2019-07-29 ENCOUNTER — Other Ambulatory Visit: Payer: Self-pay | Admitting: Nurse Practitioner

## 2019-07-29 MED ORDER — OXYCODONE HCL ER 15 MG PO T12A: 15.0000 mg | EXTENDED_RELEASE_TABLET | Freq: Two times a day (BID) | ORAL | 0 refills | Status: DC

## 2019-07-30 ENCOUNTER — Other Ambulatory Visit: Payer: Self-pay

## 2019-07-30 ENCOUNTER — Encounter: Payer: Self-pay | Admitting: Internal Medicine

## 2019-07-30 NOTE — Progress Notes (Signed)
Pre-visit assessment completed prior to [place] appointment with Dr. Rogue Bussing on 07/31/2019. Multiple concerns identified as listed below.  Concerns:  1. Pt has concerns about current treatment regimen because of the severity of side effects that she is experiencing. She reports frequent nausea, diarrhea, and new onset of SOB with minimal activity. She also is having difficulty sleeping and would like to discuss increasing her dose of Amitriptyline. She states "It only works if I take two," indicating that she is doubling the dose that is currently prescribed.  2. She reports that she is being followed by orthopedics for a torn left rotator cuff that will require a surgical repair. She would like to discuss whether surgical intervention is appropriate at this time.

## 2019-07-30 NOTE — Telephone Encounter (Signed)
Transition Care Management Follow-up Telephone Call  Date of discharge and from where: 07/24/19 [facility]  How have you been since you were released from the hospital? Pt states she is doing okay, had appt with cancer center on 10/9 following discharge. Pain is 7/10 today.  Any questions or concerns? No   Items Reviewed:  Did the pt receive and understand the discharge instructions provided? Yes   Medications obtained and verified? Yes   Any new allergies since your discharge? No   Dietary orders reviewed? Yes  Do you have support at home? Yes   Functional Questionnaire: (I = Independent and D = Dependent) ADLs: I  Bathing/Dressing- I  Meal Prep- I  Eating- I  Maintaining continence- I  Transferring/Ambulation- i  Managing Meds- I  Follow up appointments reviewed:   PCP Hospital f/u appt confirmed? Yes  Scheduled to see Delsa Grana PAC on 08/04/19 @ 1:00.  [facility] f/u appt confirmed? Yes  Scheduled to see Dr. Rogue Bussing on 07/31/19.  Are transportation arrangements needed? No   If their condition worsens, is the pt aware to call PCP or go to the Emergency Dept.? Yes  Was the patient provided with contact information for the PCP's office or ED? Yes  Was to pt encouraged to call back with questions or concerns? Yes

## 2019-07-31 ENCOUNTER — Other Ambulatory Visit: Payer: Self-pay

## 2019-07-31 ENCOUNTER — Encounter: Payer: Self-pay | Admitting: Internal Medicine

## 2019-07-31 ENCOUNTER — Inpatient Hospital Stay: Payer: Medicaid Other

## 2019-07-31 ENCOUNTER — Inpatient Hospital Stay (HOSPITAL_BASED_OUTPATIENT_CLINIC_OR_DEPARTMENT_OTHER): Payer: Medicaid Other | Admitting: Internal Medicine

## 2019-07-31 VITALS — BP 109/82 | HR 105 | Temp 95.6°F | Resp 18 | Wt 165.4 lb

## 2019-07-31 DIAGNOSIS — Z452 Encounter for adjustment and management of vascular access device: Secondary | ICD-10-CM | POA: Diagnosis not present

## 2019-07-31 DIAGNOSIS — C7800 Secondary malignant neoplasm of unspecified lung: Secondary | ICD-10-CM

## 2019-07-31 DIAGNOSIS — C538 Malignant neoplasm of overlapping sites of cervix uteri: Secondary | ICD-10-CM

## 2019-07-31 DIAGNOSIS — C530 Malignant neoplasm of endocervix: Secondary | ICD-10-CM | POA: Diagnosis not present

## 2019-07-31 LAB — COMPREHENSIVE METABOLIC PANEL
ALT: 59 U/L — ABNORMAL HIGH (ref 0–44)
AST: 37 U/L (ref 15–41)
Albumin: 3.7 g/dL (ref 3.5–5.0)
Alkaline Phosphatase: 187 U/L — ABNORMAL HIGH (ref 38–126)
Anion gap: 6 (ref 5–15)
BUN: 15 mg/dL (ref 6–20)
CO2: 25 mmol/L (ref 22–32)
Calcium: 9.3 mg/dL (ref 8.9–10.3)
Chloride: 104 mmol/L (ref 98–111)
Creatinine, Ser: 0.75 mg/dL (ref 0.44–1.00)
GFR calc Af Amer: >60 mL/min (ref >60)
GFR calc non Af Amer: >60 mL/min (ref >60)
Glucose, Bld: 104 mg/dL — ABNORMAL HIGH (ref 70–99)
Potassium: 4.3 mmol/L (ref 3.5–5.1)
Sodium: 135 mmol/L (ref 135–145)
Total Bilirubin: 0.4 mg/dL (ref 0.3–1.2)
Total Protein: 7.1 g/dL (ref 6.5–8.1)

## 2019-07-31 LAB — CBC WITH DIFFERENTIAL/PLATELET
Abs Immature Granulocytes: 0.02 10*3/uL (ref 0.00–0.07)
Basophils Absolute: 0 10*3/uL (ref 0.0–0.1)
Basophils Relative: 0 %
Eosinophils Absolute: 0 10*3/uL (ref 0.0–0.5)
Eosinophils Relative: 0 %
HCT: 37.5 % (ref 36.0–46.0)
Hemoglobin: 12.5 g/dL (ref 12.0–15.0)
Immature Granulocytes: 0 %
Lymphocytes Relative: 13 %
Lymphs Abs: 0.7 10*3/uL (ref 0.7–4.0)
MCH: 33.1 pg (ref 26.0–34.0)
MCHC: 33.3 g/dL (ref 30.0–36.0)
MCV: 99.2 fL (ref 80.0–100.0)
Monocytes Absolute: 0.2 10*3/uL (ref 0.1–1.0)
Monocytes Relative: 4 %
Neutro Abs: 4.3 10*3/uL (ref 1.7–7.7)
Neutrophils Relative %: 83 %
Platelets: 162 10*3/uL (ref 150–400)
RBC: 3.78 MIL/uL — ABNORMAL LOW (ref 3.87–5.11)
RDW: 14.1 % (ref 11.5–15.5)
WBC: 5.2 10*3/uL (ref 4.0–10.5)
nRBC: 0 % (ref 0.0–0.2)

## 2019-07-31 LAB — MAGNESIUM: Magnesium: 1.6 mg/dL — ABNORMAL LOW (ref 1.7–2.4)

## 2019-07-31 MED ORDER — FOLIC ACID 1 MG PO TABS: 1.0000 mg | ORAL_TABLET | Freq: Every day | ORAL | 6 refills | Status: AC

## 2019-07-31 MED ORDER — HEPARIN SOD (PORK) LOCK FLUSH 100 UNIT/ML IV SOLN
500.0000 [IU] | Freq: Once | INTRAVENOUS | Status: AC
  Administered 2019-07-31: 10:00:00 500 [IU] via INTRAVENOUS

## 2019-07-31 MED ORDER — SODIUM CHLORIDE 0.9% FLUSH
10.0000 mL | Freq: Once | INTRAVENOUS | Status: DC
  Filled 2019-07-31: qty 10

## 2019-07-31 NOTE — Assessment & Plan Note (Addendum)
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 Oct 9th A/P- increased size of lung nodules; awaiting CT chest 10/16.  # DISCONTINUE kBeryle Flock given the poor tolerance/continued progression.  Patient's prognosis unfortunately is poor/given multiple lines of therapy.  #Recommend proceeding with Alimta chemotherapy every 3 weeks.  Again palliative intent was discussed.  Dexamethasone/folic acid BC30  #Discussed with the patient the difficult situation-which she has continued poor tolerance to therapy/unfortunately noted to have progressive disease  # Left shoulder effusion/inflammatory arthritis/ ? Right knee fluid aspiration currently on "gout medication"/ colchcine. Would recommend HOLDING surgery-given the ongoing poor systemic control of malignancy.  # adrenal insufficiency-on hydrocortisone 20 mg/day. Awaiting appt on 10/19 with endocrinology.  #Chronic diarrhea/constipation-stable monitor for now.  #Peripheral neuropathy stable continue gabapentin.  # Hypomagnesemia-secondary platinum chemotherapy-today mag 1.6 hold off infusion.  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 De-access # 1 weeks- cbc/cmp/mag; Alimta [new]/ b12 inection-IVFs over 1 hour /Mag2h infusion # 2 weeks-  mag level; IVFs over 1 hour /Mag2h infusion # follow up in 4 weeks-MD-Alimta /cbc/cmp/mag/; Mag 4 gm/2 hour; Dr.B

## 2019-07-31 NOTE — Progress Notes (Signed)
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 Christene Slates at [facility] in [place], [place];No surgery- Chemo-RT;   # 2018-sep - lung nodules [in [place]]  # AUG 20th, 2019-#1 Carbo-Taxol s/p 6 cycles- [No avastin sec to blood clots]: Feb 2020-bilateral lung nodules with 1 cm in size. March 2020- finished carbo-taxol #7; poor tolerance to [place].  # April 15 th 2020- Start Taxol weekly x 3; one week OFF;  # July 2020-CT- bil cavitary lesions- slightly bigger by few mm/overall STABLE; CT a/p-NED. 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  She received treatment from 11/2016-05/2017.  01/2017 cisplatin x 2 and carboplatin x 1 (01/29/2017) due to ARF and XRT.  XRT was followed by T & O on 02/01/2017 and T & N 02/10/2017 and 02/20/2017.  Course was  complicated by 80 pound weight loss, nausea, vomiting, electrolyte wasting (potassium and magnesium).  She describes that.  Is been sick constantly requiring at least 20 hospitalizations.  Follow-up CT chest and PET on 06/2017. Per patient, 'radiation worked' and no disease in the abdomen.  At that time she was noted to have lung nodules that were growing and follow-up imaging was scheduled for 10/2017.  She was admitted to hospital in [place] for small bowel obstruction which was managed conservatively and she was home for a week prior to traveling to [place] for Thanksgiving holiday where she has family.  She presented to ER in [place] on 08/2017 with nausea, vomiting, and lower abdominal pain.  Symptoms did not respond to conservative treatment.   CT on 08/26/2017 revealed small bowel obstruction with transition in the pelvis just superior to the uterus rather was a long segment of distal ileum with fatty wall thickening compatible with chronic inflammation and/or radiation enteritis. Imaging showed numerous pulmonary nodules consistent with metastatic disease. She underwent laparotomy and right ileocolectomy on 08/31/2017 at [facility].  Surgical findings revealed a thickened, matted, and scarred piece of distal small bowel close to the ileocecal valve.  She was discharged on 09/05/2017.  Pain markedly increased in intensity and imaging was performed on 09/11/2017 which revealed: Debris within the anterior abdominal wall incision concerning for infection versus packing material, s/p post ileo-colectomy with expected postoperative changes, mild colonic ileus, numerous pulmonary nodules highly concerning for metastatic disease, punctate nonobstructing nephrolithiasis.  Staples were removed and one was packed.  She was started on doxycycline.  Abdominal and pelvic CT without contrast on 09/11/2017 revealed debris within anterior abdominal wall incision concerning for  infection, versus packing  material.   She is s/p ileocolectomy with expected postoperative changes and mild colonic ileus.  There were numerous pulmonary nodules highly concerning for metastatic disease and punctate nonobstructing nephrolithiasis. She was readmitted on 09/12/2017.  She describes the onset of lower abdominal pain on 09/09/2017.  Pain markedly increased in intensity on 09/11/2017.   Staples were removed and the wound packed.  She was started on doxycycline.  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   PET scan was ordered to evaluate enlarging lung nodules with concern for recurrent cervical cancer but scan was delayed due to insurance and need to be performed in [place].  Presented to ER on 12/03/2017 for abdominal pain and emesis.  Imaging concerning for worsening possible uterine infarct and she was admitted to hospital.  Pelvic MRI was unremarkable.  Remote scarring type changes of uterus thought to be possibly related to radiation.  She was discharged on 12/08/2017.  Underwent endoscopy and colonoscopy on 12/20/2017.    On 02/22/2018 she underwent laser ablation of condylomata around the anus and vagina under anesthesia with Dr. Johney Maine.   04/15/2018:  Chest, abdomen, and pelvis CT revealed innumerable (> 100) cavitary nodules scattered in the lungs, moderately enlarging compared to the 11/08/2017 PET-CT, suspicious for metastatic disease.  One index node in the RLL measures 1.0 x 1.1 cm (previously 0.6 x 0.6 cm).  There were no new nodules.  There was an ill-defined wall thickening in the rectosigmoid with surrounding stranding  along fascia planes, probably sequela from prior radiation therapy.  There was multilevel lumbar impingement due to spondylosis and degenerative disc disease.  There was heterogeneous enhancement in the uterus (some possibly from prior radiation therapy).   04/23/2018:  PET scan revealed numerous scattered solid and cavitary nodules in the lungs stable increased in size compared to the prior PET-CT from 11/08/2017. Largest nodule was 1.1 cm in the LUL (SUV 1.9).  These demonstrated low-grade metabolic activity up to a maximum SUV of about 2.3, increased from 11/08/2017.    Case was discussed at tumor board on 04/25/2018. Consensus to pursue CT-guided biopsy (05/06/18) which revealed: Metastatic adenocarcinoma, morphologically consistent with cervical adenocarcinoma.  She has history of chronic hepatitis C which is managed by GI.  Hepatitis C genotype is 2a/2c.  She receives B12 injections for history of B12 deficiency.  On 04/24/2018 she underwent left total knee replacement with Dr. [name]. --------------------------------------------------------   DIAGNOSIS: Cervical adenocarcinoma  STAGE: IV        ;GOALS: Palliative  CURRENT/MOST RECENT THERAPY : Alimta    Malignant neoplasm of overlapping sites of cervix ([place])  01/29/2019 - 04/23/2019 Chemotherapy   The patient had PACLitaxel (TAXOL) 156 mg in sodium chloride 0.9 % 250 mL chemo infusion (</= 42m/m2), 80 mg/m2 = 156 mg, Intravenous,  Once, 3 of 4 cycles Dose modification: 65 mg/m2 (original dose 80 mg/m2, Cycle 1, Reason: Provider Judgment) Administration: 156 mg (01/29/2019), 156 mg (02/05/2019), 126 mg (02/12/2019), 126 mg (02/26/2019), 126 mg (03/13/2019), 126 mg (03/27/2019), 126 mg (03/20/2019), 126 mg (04/03/2019), 126 mg (04/10/2019)  for chemotherapy treatment.    05/08/2019 - 07/30/2019 Chemotherapy   The  patient had pembrolizumab (KEYTRUDA) 200 mg in sodium chloride 0.9 % 50 mL chemo infusion, 200 mg, Intravenous, Once, 4 of 6  cycles Administration: 200 mg (05/08/2019), 200 mg (05/29/2019), 200 mg (06/19/2019), 200 mg (07/10/2019)  for chemotherapy treatment.    08/07/2019 -  Chemotherapy   The patient had PEMEtrexed (ALIMTA) 950 mg in sodium chloride 0.9 % 100 mL chemo infusion, 500 mg/m2 = 950 mg, Intravenous,  Once, 0 of 6 cycles  for chemotherapy treatment.       HISTORY OF PRESENTING ILLNESS:  Joanna Hall 53 y.o.  female with a history of adenocarcinoma the cervix with metastasis to the lung most recently started on Keytruda status post cycle #4 is here for follow-up.  In the interim patient was again admitted to hospital for "constipation".  Patient had again a CT scan of the abdomen pelvis that did not show any bowel obstruction.  Patient's started to have bowel movements after conservative measures.  She states to have continued "diarrhea" at this time.  Otherwise no nausea no vomiting.  Continued fatigue.  Continue tingling and numbness.  Continued back pain joint pains.  Patient has been eval by orthopedics for left shoulder pain.  She has received bilateral steroid injections.  Review of Systems  Constitutional: Positive for malaise/fatigue. Negative for chills, diaphoresis, fever and weight loss.  HENT: Negative for nosebleeds and sore throat.   Eyes: Negative for double vision.  Respiratory: Negative for cough, hemoptysis, sputum production, shortness of breath and wheezing.   Cardiovascular: Negative for chest pain, palpitations, orthopnea and leg swelling.  Gastrointestinal: Positive for abdominal pain and diarrhea. Negative for blood in stool, constipation, heartburn, melena, nausea and vomiting.  Genitourinary: Negative for dysuria, frequency and urgency.  Musculoskeletal: Positive for back pain and joint pain.  Skin: Negative.  Negative for itching and rash.  Neurological: Positive for tingling. Negative for dizziness, focal weakness, weakness and headaches.  Endo/Heme/Allergies: Does not  bruise/bleed easily.  Psychiatric/Behavioral: Negative for depression. The patient is not nervous/anxious and does not have insomnia.      MEDICAL HISTORY:  Past Medical History:  Diagnosis Date  . Abdominal pain 06/10/2018  . Abnormal cervical Papanicolaou smear 09/18/2017  . Anxiety   . Aortic atherosclerosis ([place])   . Arthritis    neck and knees  . 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 Blood transfusion without reported diagnosis   . Cervical cancer ([place]) 09/2016   mets lung  . Chronic anal fissure   . Chronic diarrhea   . Dyspnea   . Erosive gastropathy 09/18/2017  . Factor V Leiden mutation ([place])   . Fecal incontinence   . Genital warts   . GERD (gastroesophageal reflux disease)   . GI bleed 10/08/2018  . Heart murmur   . Hematochezia   . Hemorrhoids   . 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 Hepatitis, chronic ([place]) 05/05/2017  . History of cancer chemotherapy    completed 06/2017  . History of Clostridium difficile infection    while undergoing chemo.  Negative test 10/2017  . Ileocolic anastomotic leak   . 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 Intestinal infection due to Clostridium difficile 09/18/2017  . Macrocytic anemia with vitamin B12 deficiency   . Multiple gastric ulcers   . Nausea vomiting and diarrhea   . Pancolitis ([place]) 07/27/2018  . Perianal condylomata   .  Pneumonia    History of  . Pulmonary nodules   . Rectal bleeding   . Small bowel obstruction ([place]) 08/2017  . Stiff neck    limited right turn  . Vitamin D deficiency     SURGICAL HISTORY: Past Surgical History:  Procedure Laterality Date  . CHOLECYSTECTOMY    . COLON SURGERY  08/2017   resection  . COLONOSCOPY WITH PROPOFOL N/A 12/20/2017   Procedure: COLONOSCOPY WITH PROPOFOL;  Surgeon: [name], MD;  Location: [facility] ENDOSCOPY;  Service: Gastroenterology;  Laterality: N/A;  . COLONOSCOPY WITH PROPOFOL N/A 07/30/2018    Procedure: COLONOSCOPY WITH PROPOFOL;  Surgeon: [name], MD;  Location: [facility] ENDOSCOPY;  Service: Gastroenterology;  Laterality: N/A;  . COLONOSCOPY WITH PROPOFOL N/A 10/10/2018   Procedure: COLONOSCOPY WITH PROPOFOL;  Surgeon: Lucilla Lame, MD;  Location: [facility] ENDOSCOPY;  Service: Endoscopy;  Laterality: N/A;  . DIAGNOSTIC LAPAROSCOPY    . ESOPHAGOGASTRODUODENOSCOPY (EGD) WITH PROPOFOL N/A 12/20/2017   Procedure: ESOPHAGOGASTRODUODENOSCOPY (EGD) WITH PROPOFOL;  Surgeon: [name], MD;  Location: [facility];  Service: Gastroenterology;  Laterality: N/A;  . ESOPHAGOGASTRODUODENOSCOPY (EGD) WITH PROPOFOL  07/30/2018   Procedure: ESOPHAGOGASTRODUODENOSCOPY (EGD) WITH PROPOFOL;  Surgeon: [name], MD;  Location: [facility] ENDOSCOPY;  Service: Gastroenterology;;  . Otho Darner SIGMOIDOSCOPY N/A 11/21/2018   Procedure: FLEXIBLE SIGMOIDOSCOPY;  Surgeon: [name], MD;  Location: [facility] ENDOSCOPY;  Service: Gastroenterology;  Laterality: N/A;  . LAPAROTOMY N/A 08/31/2017   Procedure: EXPLORATORY LAPAROTOMY for SBO, ileocolectomy, removal of piece of uterine wall;  Surgeon: [name], MD;  Location: [facility] ORS;  Service: General;  Laterality: N/A;  . LASER ABLATION CONDOLAMATA N/A 02/22/2018   Procedure: LASER ABLATION/REMOVAL OF [id number] AROUND ANUS AND VAGINA;  Surgeon: Michael Boston, MD;  Location: [facility];  Service: General;  Laterality: N/A;  . OOPHORECTOMY    . PORTA CATH INSERTION N/A 05/13/2018   Procedure: PORTA CATH INSERTION;  Surgeon: Katha Cabal, MD;  Location: [facility] CV LAB;  Service: Cardiovascular;  Laterality: N/A;  . SMALL INTESTINE SURGERY    . TANDEM RING INSERTION     x3  . THORACOTOMY    . TOTAL KNEE ARTHROPLASTY Left 04/24/2018   Procedure: TOTAL KNEE ARTHROPLASTY;  Surgeon: Lovell Sheehan, MD;  Location: [facility] ORS;  Service: Orthopedics;  Laterality: Left;    SOCIAL HISTORY: Social History    Socioeconomic History  . Marital status: Divorced    Spouse name: Not on file  . Number of children: Not on file  . Years of education: Not on file  . Highest education level: Not on file  Occupational History  . Not on file  Social Needs  . Financial resource strain: Not hard at all  . Food insecurity    Worry: Never true    Inability: Never true  . Transportation needs    Medical: No    Non-medical: No  Tobacco Use  . Smoking status: Former Smoker    Packs/day: 0.25    Years: 10.00    Pack years: 2.50    Types: Cigarettes    Quit date: 10/16/2006    Years since quitting: 12.8  . Smokeless tobacco: Never Used  Substance and Sexual Activity  . Alcohol use: Not Currently    Frequency: Never    Comment: seldom  . Drug use: Yes    Types: Marijuana    Comment: not very often  . Sexual activity: Not Currently    Birth control/protection: Post-menopausal  Comment: Not Asked  Lifestyle  . Physical activity    Days per week: Patient refused    Minutes per session: Patient refused  . Stress: Only a little  Relationships  . Social Herbalist on phone: Patient refused    Gets together: Patient refused    Attends religious service: Patient refused    Active member of club or organization: Patient refused    Attends meetings of clubs or organizations: Patient refused    Relationship status: Patient refused  . Intimate partner violence    Fear of current or ex partner: No    Emotionally abused: No    Physically abused: No    Forced sexual activity: No  Other Topics Concern  . Not on file  Social History Narrative  . Not on file    FAMILY HISTORY: Family History  Problem Relation Age of Onset  . Hypertension Father   . Diabetes Father   . Alcohol abuse Daughter   . Hypertension Maternal Grandmother   . Diabetes Maternal Grandmother   . Diabetes Paternal Grandmother   . Hypertension Paternal Grandmother     ALLERGIES:  is allergic to  ketamine.  MEDICATIONS:  Current Outpatient Medications  Medication Sig Dispense Refill  . amitriptyline (ELAVIL) 75 MG tablet Take 1 tablet (75 mg total) by mouth at bedtime. 90 tablet 1  . Calcium Carb-Cholecalciferol (CALCIUM 500 +D) 500-400 MG-UNIT TABS Take 2 tablets by mouth daily.    . hydrocortisone (CORTEF) 20 MG tablet Take 1 tablet (20 mg total) by mouth daily. 30 tablet 0  . hydrOXYzine (ATARAX/VISTARIL) 10 MG tablet Take 1 tablet (10 mg total) by mouth 3 (three) times daily as needed. 60 tablet 0  . hyoscyamine (LEVBID) 0.375 MG 12 hr tablet Take 1 tablet (0.375 mg total)by mouth 2 (two) times daily. 60 tablet 0  . Multiple Vitamins-Minerals (MULTIVITAMIN WITH MINERALS) tablet Take 1 tablet by mouth daily. 30 tablet 0  . ondansetron (ZOFRAN ODT) 4 MG disintegrating tablet Take 1 tablet (4 mg total) by mouth every 8 (eight) hours as needed for nausea or vomiting. 30 tablet 2  . oxyCODONE (OXYCONTIN) 15 mg 12 hr tablet Take 1 tablet (15 mg total) by mouth every 12 (twelve) hours. 60 tablet 0  . Oxycodone HCl 10 MG TABS Take 1 tablet (10 mg total) by mouth every 6 (six) hours as needed (for breakthrough pain). 120 tablet 0  . pantoprazole (PROTONIX) 40 MG tablet Take 1 tablet (40 mg total) by mouth daily. 90 tablet 1  . potassium chloride SA (K-DUR) 20 MEQ tablet Take 1 tablet (20 mEq total) by mouth daily as needed (if having nausea/vomiting- to supplements for lost potassium). 30 tablet 0  . promethazine (PHENERGAN) 25 MG tablet Take 1 tablet (25 mg total) by mouth every 8 (eight) hours as needed for nausea or vomiting. 30 tablet 0  . rivaroxaban (XARELTO) 20 MG TABS tablet Take 1 tablet (20 mg total) by mouth daily with supper. (Patient taking differently: Take 20 mg by mouth daily with supper. Takes in the morning) 30 tablet 6  . dexamethasone (DECADRON) 4 MG tablet Take one pill AM & PM x 3 days; start the day prior to chemo. 60 tablet 0  . diphenoxylate-atropine (LOMOTIL) 2.5-0.025  MG tablet TAKE (2) TABLETS FOUR TIMES DAILY. (Patient not taking: Reported on 07/30/2019) 240 tablet 0  . famotidine (PEPCID) 20 MG tablet Take 1 tablet (20 mg total) by mouth 2 (two) times daily for  5 days. (Patient taking differently: Take 20 mg by mouth 2 (two) times daily as needed. ) 10 tablet 0  . folic acid (FOLVITE) 1 MG tablet Take 1 tablet (1 mg total) by mouth daily. 30 tablet 6  . VENTOLIN HFA 108 (90 Base) MCG/ACT inhaler Inhale 1-2 puffs into the lungs every 4 (four) hours as needed for shortness of breath. (Patient not taking: Reported on 07/30/2019) 1 Inhaler 1   No current facility-administered medications for this visit.    Facility-Administered Medications Ordered in Other Visits  Medication Dose Route Frequency Provider Last Rate Last Dose  . heparin lock flush 100 unit/mL  500 Units Intravenous Once Corcoran, Melissa C, MD      . sodium chloride 0.9 % 1,000 mL with potassium chloride 20 mEq infusion   Intravenous Once Honor Loh E, NP      . sodium chloride flush (NS) 0.9 % injection 10 mL  10 mL Intravenous Once Borders, Kirt Boys, NP          .  PHYSICAL EXAMINATION: ECOG PERFORMANCE STATUS: 1 - Symptomatic but completely ambulatory  Vitals:   07/31/19 0909  BP: 109/82  Pulse: (!) 105  Resp: 18  Temp: (!) 95.6 F (35.3 C)  SpO2: 98%   Filed Weights   07/31/19 0909  Weight: 165 lb 6.4 oz (75 kg)    Physical Exam  Constitutional: She is oriented to person, place, and time and well-developed, well-nourished, and in no distress.  Alone.  Walking herself.   HENT:  Head: Normocephalic and atraumatic.  Mouth/Throat: Oropharynx is clear and moist. No oropharyngeal exudate.  Eyes: Pupils are equal, round, and reactive to light.  Neck: Normal range of motion. Neck supple.  Cardiovascular: Normal rate and regular rhythm.  Pulmonary/Chest: Effort normal and breath sounds normal. No respiratory distress. She has no wheezes.  Abdominal: Soft. Bowel sounds are  normal. She exhibits no distension and no mass. There is no rebound and no guarding.  Musculoskeletal: Normal range of motion.        General: No tenderness or edema.  Neurological: She is alert and oriented to person, place, and time.  Skin: Skin is warm.  Psychiatric: Affect normal.     LABORATORY DATA:  I have reviewed the data as listed Lab Results  Component Value Date   WBC 5.2 07/31/2019   HGB 12.5 07/31/2019   HCT 37.5 07/31/2019   MCV 99.2 07/31/2019   PLT 162 07/31/2019   Recent Labs    10/21/18 0845  07/10/19 0816 07/22/19 1001 07/23/19 0937 07/25/19 1044 07/31/19 0836  NA 134*   < > 136 136 136 134* 135  K 3.9   < > 3.8 3.8 3.9 4.1 4.3  CL 98   < > 102 100 97* 100 104  CO2 24   < > 27 27 28 25 25   GLUCOSE 111*   < > 92 84 101* 92 104*  BUN 17   < > 12 18 12 18 15   CREATININE 0.91   < > 0.78 0.90 0.70 0.84 0.75  CALCIUM 9.6   < > 9.2 9.2 9.2 9.2 9.3  GFRNONAA >60   < > >60 >60 >60 >60 >60  GFRAA >60   < > >60 >60 >60 >60 >60  PROT 8.6*   < > 7.1 6.7  --   --  7.1  ALBUMIN 4.4   < > 3.6 3.7  --   --  3.7  AST 48*   < >  44* 35  --   --  37  ALT 67*   < > 103* 43  --   --  59*  ALKPHOS 185*   < > 215* 139*  --   --  187*  BILITOT 1.4*   < > 0.4 0.5  --   --  0.4  BILIDIR 0.3*  --   --   --   --   --   --    < > = values in this interval not displayed.    RADIOGRAPHIC STUDIES: I have personally reviewed the radiological images as listed and agreed with the findings in the report. Abd 1 View (kub)  Result Date: 07/23/2019 CLINICAL DATA:  Constipation EXAM: ABDOMEN - 1 VIEW COMPARISON:  Abdominal CT from yesterday FINDINGS: Formed stool throughout the descending and sigmoid colon. Mild stool in the proximal colon. There has been modest evacuation since yesterday. No obstructive bowel dilatation. Right upper quadrant clips. Lumbar spine degeneration with levoscoliosis. Cavitary pulmonary nodules. IMPRESSION: Improving stool burden when compared to CT yesterday.  Electronically Signed   By: Monte Fantasia M.D.   On: 07/23/2019 07:22   Ct Abdomen Pelvis W Contrast  Result Date: 07/22/2019 CLINICAL DATA:  Constipation. No bowel movement in 3 days. History of bowel obstructions and bowel resection. EXAM: CT ABDOMEN AND PELVIS WITH CONTRAST TECHNIQUE: Multidetector CT imaging of the abdomen and pelvis was performed using the standard protocol following bolus administration of intravenous contrast. CONTRAST:  118m OMNIPAQUE IOHEXOL 300 MG/ML  SOLN COMPARISON:  06/21/2019 FINDINGS: Lower chest: Multiple lung base nodules, many cavitary. Mild overall increase in the size of pulmonary nodules. There is a cavitary nodule in the posteromedial right lower lobe, image 1, series 4, 12 mm, previously 9 mm. Left lower lobe nodule, image 4, series 4, 11 mm, previously 9.5 mm. Hepatobiliary: Liver normal in size and attenuation. No mass there is intrahepatic bile duct dilation similar to the prior study. Common bile duct is dilated to a maximum of 1.3 cm. It tapers distally. This is also stable. Status post cholecystectomy. Pancreas: Unremarkable. No pancreatic ductal dilatation or surrounding inflammatory changes. Spleen: Normal in size without focal abnormality. Adrenals/Urinary Tract: No adrenal masses. Kidneys normal in size, orientation and position. Symmetric renal enhancement and excretion. Tiny bilateral nonobstructing intrarenal stones. No renal masses. No hydronephrosis. Ureters normal in course and in caliber. Bladder is unremarkable. Stomach/Bowel: Status post partial right hemicolectomy. No evidence of bowel obstruction. No bowel wall thickening or inflammation. Moderate increased stool burden noted throughout the colon. Stomach is unremarkable. Vascular/Lymphatic: Aortic atherosclerosis. No enlarged abdominal or pelvic lymph nodes. Reproductive: Questionable residual uterine tissue above the vaginal cuff measuring 3.3 x 3.1 x 3.5 cm. History reports cervical cancer with  metastatic disease to the lung. It is unclear whether there has been a hysterectomy. This appearance is stable from the prior CT. No adnexal masses. Other: No abdominal wall hernia or abnormality. No abdominopelvic ascites. Musculoskeletal: No fracture or acute finding. No osteoblastic or osteolytic lesions. IMPRESSION: 1. No acute findings within the abdomen or pelvis. No evidence of bowel obstruction. 2. Moderate constipation with increased stool burden throughout the colon. Stable changes from a partial right hemicolectomy. 3. Multiple lung base nodules, many cavitary, with some progression from the prior CT, several nodules having increased in size. 4. Soft tissue above the vaginal cuff, which may reflect residual uterine tissue. This is stable on multiple prior CTs. 5. Aortic atherosclerosis. 6. Chronic intra and extrahepatic bile duct dilation. Status  post cholecystectomy. 7. Tiny nonobstructing intrarenal stones. Electronically Signed   By: Lajean Manes M.D.   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 Oct 9th A/P- increased size of lung nodules; awaiting CT chest 10/16.  # DISCONTINUE kBeryle Flock given the poor tolerance/continued progression.  Patient's prognosis unfortunately is poor/given multiple lines of therapy.  #Recommend proceeding with Alimta chemotherapy every 3 weeks.  Again palliative intent was discussed.  Dexamethasone/folic acid BT09  #Discussed with the patient the difficult situation-which she has continued poor tolerance to therapy/unfortunately noted to have progressive disease  # Left shoulder effusion/inflammatory arthritis/ ? Right knee fluid aspiration currently on "gout  medication"/ colchcine. Would recommend HOLDING surgery-given the ongoing poor systemic control of malignancy.  # adrenal insufficiency-on hydrocortisone 20 mg/day. Awaiting appt on 10/19 with endocrinology.  #Chronic diarrhea/constipation-stable monitor for now.  #Peripheral neuropathy stable continue gabapentin.  # Hypomagnesemia-secondary platinum chemotherapy-today mag 1.6 hold off infusion.  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 De-access # 1 weeks- cbc/cmp/mag; Alimta [new]/ b12 inection-IVFs over 1 hour /Mag2h infusion # 2 weeks-  mag level; IVFs over 1 hour /Mag2h infusion # follow up in 4 weeks-MD-Alimta /cbc/cmp/mag/; Mag 4 gm/2 hour; Dr.B     All questions were answered. The patient knows to call the clinic with any problems, questions or concerns.     GCammie Sickle MD 08/01/2019 6:50 AM

## 2019-07-31 NOTE — Progress Notes (Signed)
Pt in for follow up and treatment today.

## 2019-08-01 ENCOUNTER — Ambulatory Visit
Admission: RE | Admit: 2019-08-01 | Discharge: 2019-08-01 | Disposition: A | Discharge status: Home / Self Care | Payer: Medicaid Other | Source: Ambulatory Visit | Attending: Internal Medicine | Admitting: Internal Medicine

## 2019-08-01 DIAGNOSIS — C538 Malignant neoplasm of overlapping sites of cervix uteri: Secondary | ICD-10-CM | POA: Insufficient documentation

## 2019-08-01 DIAGNOSIS — C7800 Secondary malignant neoplasm of unspecified lung: Secondary | ICD-10-CM | POA: Diagnosis present

## 2019-08-01 MED ORDER — DEXAMETHASONE 4 MG PO TABS: ORAL_TABLET | ORAL | 0 refills | Status: DC

## 2019-08-01 NOTE — Progress Notes (Signed)
DISCONTINUE OFF PATHWAY REGIMEN - Other   [id number]:Pembrolizumab 200 mg q21 Days:   A cycle is 21 days:     Pembrolizumab   **Always confirm dose/schedule in your pharmacy ordering system**  REASON: Disease Progression PRIOR TREATMENT: Pembrolizumab 200 mg q21 Days TREATMENT RESPONSE: Progressive Disease (PD)  START OFF PATHWAY REGIMEN - Other   [id number]:Pemetrexed 500 mg/m2 q21 Days:   A cycle is every 21 days:     Pemetrexed   **Always confirm dose/schedule in your pharmacy ordering system**  Patient Characteristics: Intent of Therapy: Non-Curative / Palliative Intent, Discussed with Patient

## 2019-08-04 ENCOUNTER — Inpatient Hospital Stay: Payer: Medicaid Other | Admitting: Family Medicine

## 2019-08-04 ENCOUNTER — Other Ambulatory Visit: Payer: Self-pay | Admitting: Oncology

## 2019-08-04 NOTE — Telephone Encounter (Signed)
Patient called [facility] requesting refill of oxycodone.  As mandated by the [place] STOP Act (Strengthen Opioid Misuse Prevention), the [place] Controlled Substance Reporting System ([place]) was reviewed for this patient.  Refill is appropriate based on this information. Dr. Rogue Bussing agrees that that continuation of opiate therapy is medically appropriate at this time and agrees to provide continual monitoring, including urine/blood drug screens, as indicated. Prescription sent electronically using [employer] secure transmission to requested pharmacy.   Beckey Rutter, DNP, AGNP-[facility]

## 2019-08-05 ENCOUNTER — Other Ambulatory Visit: Payer: Self-pay | Admitting: Gastroenterology

## 2019-08-05 ENCOUNTER — Ambulatory Visit
Admission: RE | Admit: 2019-08-05 | Discharge: 2019-08-05 | Disposition: A | Discharge status: Home / Self Care | Payer: Medicaid Other | Source: Ambulatory Visit | Attending: Internal Medicine | Admitting: Internal Medicine

## 2019-08-05 ENCOUNTER — Other Ambulatory Visit: Payer: Self-pay

## 2019-08-05 DIAGNOSIS — Z452 Encounter for adjustment and management of vascular access device: Secondary | ICD-10-CM | POA: Insufficient documentation

## 2019-08-05 DIAGNOSIS — K529 Noninfective gastroenteritis and colitis, unspecified: Secondary | ICD-10-CM

## 2019-08-05 DIAGNOSIS — C538 Malignant neoplasm of overlapping sites of cervix uteri: Secondary | ICD-10-CM | POA: Insufficient documentation

## 2019-08-05 MED ORDER — IOHEXOL 300 MG/ML  SOLN
10.0000 mL | Freq: Once | INTRAMUSCULAR | Status: AC | PRN
  Administered 2019-08-05: 10 mL

## 2019-08-05 MED ORDER — HEPARIN SOD (PORK) LOCK FLUSH 100 UNIT/ML IV SOLN
INTRAVENOUS | Status: AC
  Administered 2019-08-05: 11:00:00 500 [IU]
  Filled 2019-08-05: qty 5

## 2019-08-05 NOTE — Progress Notes (Signed)
Patient pre screened for office appointment, no questions or concerns today. 

## 2019-08-06 ENCOUNTER — Other Ambulatory Visit: Payer: Self-pay

## 2019-08-06 ENCOUNTER — Inpatient Hospital Stay (HOSPITAL_BASED_OUTPATIENT_CLINIC_OR_DEPARTMENT_OTHER): Payer: Medicaid Other | Admitting: Obstetrics and Gynecology

## 2019-08-06 VITALS — BP 124/86 | HR 108 | Resp 16 | Wt 172.4 lb

## 2019-08-06 DIAGNOSIS — C530 Malignant neoplasm of endocervix: Secondary | ICD-10-CM | POA: Diagnosis not present

## 2019-08-06 NOTE — Progress Notes (Signed)
Gynecologic Oncology Interval Visit   Referring Provider: Dr. Rogue Bussing  Chief Concern: Recurrent stage IB1 cervical cancer s/p chemoradiation with ileocolectomy for bowel obstruction and recurrence. Now with progressive disease in lungs despite additional systemic therapy. Requiring multiple admissions and ED visits for symptom control.  Subjective:  Joanna Hall is a 53 y.o. female, with history of untreated hepatitis c and diagnosed with recurrent stage IB1 cervical adenocarcinoma, seen in consultation for Dr. Rogue Bussing, returns to clinic today for follow-up.  Multiple admissions recently for SBO, colitis, N/V, diarrhea, IV hydration, electrolyte abnormalities and pain. Had 6 ED visits and five hospitalizations since August. Being followed by palliative care and offered hospice, but not ready for this.  Pain relatively well controlled with long acting narcotics.  Plan with Dr Rogue Bussing was to start Alimta q 3 wk. She complains of some urine leakage when stands, but no urgency.    December 2017- She was diagnosed with probable recurrent stage IB1 cervical adenocarcinoma status post aborted radical hysterectomy due to infected adnexa on 2/18 at [facility] followed by chemo-radiation with apparent complete response.  September 2018- She developed bowel obstruction and had resection of terminal ileum and cecum 11/18 at [facility]. Imaging showed enlarging lung mets which were biopsy proven metastatic cervical adenocarcinoma.  She initiated carboplatin-taxol on 06/04/18 and completed 6 cycles. No avastin secondary to blood clots/bilateral PE/kidney infarct; Factor V Leiden- Xarelto (small bowel obstruction). February 2020 - bilateral lung nodules ~1cm in size. March 2020 - completed carbo-taxol #7. Carbo discontinued secondary to poor tolerance/multiple hospitalizations.   January 29, 2019- started taxol weekly x 3, one week off.   CT A/P 01/2019 showed no evidence of progressive disease in the abdomen  pelvis with stable bowel thickening. Lung nodule was stable.   Given PDL-1 positivity, recommendation for [place] discussed due to progressive disease and she was treated with this for 4 cycles 7-9//20.   CT mid OCT 2020-progressive disease in the lungs;  Stopped Keytruda with plan for Alimta.  Gynecologic Oncology History The patient was diagnosed with cervical adenocarcinoma in [place] in 09/2016.  She has had a long standing history of abnormal PAP smears.  She presented with an ovarian cyst.  Laparoscopic surgery was difficult secondary to scar tissue.  Had BSO and rupture of cyst with purulent drainage into abdomen.  Had Stage IB1 (4 cm) endocervical adenocarcinoma. Radical hysterectomy aborted.   She was treated by Dr Christene Slates at [facility] [place] [facility] in [place], [place]. Decision was made to pursue concurrent chemotherapy (weekly cisplatin) and radiation.  She received treatment from 11/2016 - 05/2017.  01/2017 cisplatin x 2 and Carboplatin x 1 (01/29/17) due to ARF and XRT. XRT followed by T & O on 02/01/17 and T & N 4.28/18 and 02/20/17 . Course was complicated by weight loss (80 pounds), nausea, vomiting, electrolyte wasting (potassium and magnesium).  She describes being "sick constantly" and requiring at least 20 hospitalizations.   She underwent follow-up chest CT then PET scan in 06/2017.  She states that "the radiation worked" and there was no disease in the abdomen.  She was noted to have lung nodules that were growing.  She was scheduled to have follow-up imaging in 10/2017.  She was admitted in [place] with a small bowel obstruction.  She was managed conservatively.  She was home for about a week then traveled to [place] for Thanksgiving holiday (she has family here).  She presented on 08/27/2017 with nausea, vomiting, and lower abdominal pain.  Symptoms did not respond to conservative measurement.    Preoperative CT on 08/26/2017  revealed  SBO with transition in the pelvis just superior to the uterus where there was a long segment of distal ileum with fatty wall thickening compatible with chronic inflammation and/or radiation enteritis.  She underwent laparotomy and right ileocolectomy on 08/31/2017 at [facility].  There were numerous pulmonary nodules c/w metastatic disease.  Surgical findings revealed a thickened, matted, and scarred piece of distal small bowel close to the ileocecal valve.  Diet was slowly advanced.  She was discharged on 09/05/2017.  Abdominal and pelvic CT without contrast on 09/11/2017 revealed debris within anterior abdominal wall incision concerning for infection, versus packing material.   She is s/p ileocolectomy with expected postoperative changes and mild colonic ileus.  There were numerous pulmonary nodules highly concerning for metastatic disease and punctate nonobstructing nephrolithiasis. She was readmitted on 09/12/2017.  She describes the onset of lower abdominal pain on 09/09/2017.  Pain markedly increased in intensity on 09/11/2017.   Staples were removed and the wound packed.  She was started on doxycycline.  Abdomen and pelvic CT with contrast revealed postsurgical changes from ileocecectomy with primary ileocolic anastomosis without evidence of abscess or leak.  There was edema of small bowel loops at distal ileum potentially related to surgery.  There was gas within ventral midline surgical wound corresponding to wound infection versus packing material.  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  She denies any history of thrombosis.  She denies any family history of thrombosis.  She stopped smoking 10 years ago (1/4 ppd) and has a history of thoracotomy.  She denies any family history of rheumatologic problems/vasculitis/autoimmune disorders.  Thrombosis work up showed she is heterozygous for Factor V Leiden and taking Xarelto.  Patient had diarrhea  despite Lomotil and Imodium with 15 watery BMs per day.  Dr. Hampton Abbot who did the bowel resection managing.  PET scan ordered to evaluate enlarging lung nodules with concern for recurrent cervical cancer. Scan not done yet due to insurance issues and she will have to get the study done in [place].   She was having chronic diarrhea postoperatively and has history of chronic hep C and was seen by surgery and GI for management.  On 12/03/2017 she presented to ER for abdominal pain and emesis.  Imaging revealed:  12/03/17- CT A/P: continued multiple pulmonary nodules inthelung bases concerning for metastatic disease. There was mild wall thickening of rectosigmoid colon is noted.There was a stable 1.8 x 1.6 cm mixed fat and soft tissue densitynoted in right lower quadrant which may represent fat necrosis.There was a larger low density is noted involving uterine fundus.   12/03/2017:  Pelvic MRI was unremarkable.   04/15/2018:  Chest, abdomen, and pelvis CT revealed innumerable (> 100) cavitary nodules scattered in the lungs, moderately enlarging compared to the 11/08/2017 PET-CT, suspicious for metastatic disease.  One index node in the RLL measures 1.0 x 1.1 cm (previously 0.6 x 0.6 cm).  There were no new nodules.  There was an ill-defined wall thickening in the rectosigmoid with surrounding stranding along fascia planes, probably sequela from prior radiation therapy.  There was multilevel lumbar impingement due to spondylosis and degenerative disc disease.  There was heterogeneous enhancement in the uterus (some possibly from prior radiation therapy).  04/23/2018:  PET scan revealed numerous scattered solid and cavitary nodules in the lungs stable increased in size compared to the prior PET-CT  from 11/08/2017. Largest nodule was 1.1 cm in the LUL (SUV 1.9).  These demonstrated low-grade metabolic activity up to a maximum SUV of about 2.3, increased from 11/08/2017.   On 04/24/2018 she  underwent left total knee replacement with Dr. [name].  She is hepatitis C positive.  Hepatitis C genotype is 2a/2c.  She received B12 injections for history of B12 deficiency.   Case was discussed at tumor board on 04/25/2018 since this was to pursue CT-guided biopsy of left upper lobe pulmonary nodule which was performed on 05/06/18- Pathology: Metastatic adenocarcinoma morphologically consistent with cervical adenocarcinoma.   06/04/18 She initiated carboplatin-Taxol.    Foundation One testing was positive for PD-L1.   CT A/P w/ contrast 07/27/18 for abdominal pain/nausea/diarrhea Diffuse thickening of the walls of the entire colon, most prominent within the transverse colon and worsened thickening of the walls of the transverse colon compared to earlier episodes, consistent with recurrent colitis of infectious or inflammatory nature, possibly radiation related. Stable small lung nodules compatible with metastatic disease.   She was admitted to hospital 12/19 for lower GI bleed.  Colonoscopy revealed 3 bleeding terminal ileal ulcers but no anastomotic ulcer.  Xarelto was held.  She presented to clinic reporting vaginal spotting and was seen in symptom management by Beckey Rutter, NP on 10/15/2018.  Exam revealed small ulceration in vaginal cuff as possible previous source of bleeding now healing.  She was also reporting dysuria at that time.  UA was consistent with UTI and she was started on Macrobid however, culture was negative.  Given pain and symptoms imaging was performed.  10/17/2018-CT a/P w/ contrast which was positive for SBO at left hemiabdomen. No discrete mass and felt to possibly reflect adhesions. Bilateral pulmonary nodules compatible with metastatic disease.   09/30/2018-CT chest w contrast 1. Interval decrease in size and number of metastatic pulmonary nodules. 2. No new/progressive findings in the chest or upper abdomen. Aortic Atherosclerosis (ICD10-I70.0) and Emphysema  (ICD10-J43.9).  Problem List: Patient Active Problem List   Diagnosis Date Noted  . Abdominal pain 07/23/2019  . Constipation 07/22/2019  . Malnutrition of moderate degree 06/22/2019  . Small bowel obstruction due to adhesions ([place]) 06/21/2019  . Acute gastroenteritis 06/14/2019  . Intractable nausea and vomiting 06/07/2019  . Chest pain in adult 05/19/2019  . Intractable pain 05/19/2019  . Cancer associated pain   . Proctitis, radiation   . Palliative care encounter   . Encounter for monitoring opioid maintenance therapy 11/19/2018  . Adrenal insufficiency ([place]) 09/03/2018  . Chronic diarrhea 06/25/2018  . Chronic anticoagulation 06/25/2018  . Vomiting   . Encounter for antineoplastic chemotherapy 06/04/2018  . 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 Lung metastasis ([place]) 05/15/2018  . Closed fracture of distal end of radius 05/04/2018  . History of total knee arthroplasty 04/24/2018  . Osteoarthritis of left knee 02/28/2018  . Condyloma acuminatum of anus s/p ablation 02/22/2018 02/22/2018  . Condyloma acuminatum of vagina s/p ablation 02/22/2018 02/22/2018  . Positive ANA (antinuclear antibody) 02/04/2018  . Aortic atherosclerosis ([place]) 01/15/2018  . Elevated MCV 01/15/2018  . Anemia 01/15/2018  . Genital warts 01/15/2018  . Vitamin D deficiency 01/15/2018  . Pernicious anemia   . Impingement syndrome of shoulder region 12/19/2017  . Multiple joint pain 12/19/2017  . Osteoarthritis of right knee 12/19/2017  . B12 deficiency 12/11/2017  . Lung nodules 12/11/2017  . History of Clostridium difficile infection 10/23/2017  . Factor V Leiden ([place]) 10/19/2017  . Goals of care, counseling/discussion 10/19/2017  .  Cervical cancer, FIGO stage IB1 ([place]) 09/18/2017  . Chronic hepatitis C without hepatic coma ([place]) 09/18/2017  . Cytopenia 09/18/2017  . Diarrhea 09/18/2017  . Lower abdominal pain 09/18/2017  . 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 Malignant neoplasm of overlapping  sites of cervix ([place]) 09/18/2017  . Renal insufficiency 09/18/2017  . Wound infection after surgery 09/12/2017  . Hypokalemia   . Hypomagnesemia   . Cervical arthritis 07/18/2017  . Dysuria 06/20/2017  . Metastatic cancer ([place]) 05/12/2017  . Essential hypertension 03/15/2017  . Anemia in other chronic diseases classified elsewhere 03/01/2017  . Chemotherapy-induced neutropenia ([place]) 01/29/2017  . 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[place]) 09/25/2016    Past Medical History: Past Medical History:  Diagnosis Date  . Abdominal pain 06/10/2018  . Abnormal cervical Papanicolaou smear 09/18/2017  . Anxiety   . Aortic atherosclerosis ([place])   . Arthritis    neck and knees  . 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 Blood transfusion without reported diagnosis   . Cervical cancer ([place]) 09/2016   mets lung  . Chronic anal fissure   . Chronic diarrhea   . Dyspnea   . Erosive gastropathy 09/18/2017  . Factor V Leiden mutation ([place])   . Fecal incontinence   . Genital warts   . GERD (gastroesophageal reflux disease)   . GI bleed 10/08/2018  . Heart murmur   . Hematochezia   . Hemorrhoids   . 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 Hepatitis, chronic ([place]) 05/05/2017  . History of cancer chemotherapy    completed 06/2017  . History of Clostridium difficile infection    while undergoing chemo.  Negative test 10/2017  . Ileocolic anastomotic leak   . Infarction of kidney ([place]) left kidney   and uterus  . Intestinal infection due to Clostridium difficile 09/18/2017  . Macrocytic anemia with vitamin B12 deficiency   . Multiple gastric ulcers   . Nausea vomiting and diarrhea   . Pancolitis ([place]) 07/27/2018  . Perianal condylomata   . Pneumonia    History of  . Pulmonary nodules   . Rectal bleeding   . Small bowel obstruction ([place]) 08/2017  . Stiff neck    limited right turn  . Vitamin D deficiency     Past Surgical History: Past Surgical  History:  Procedure Laterality Date  . CHOLECYSTECTOMY    . COLON SURGERY  08/2017   resection  . COLONOSCOPY WITH PROPOFOL N/A 12/20/2017   Procedure: COLONOSCOPY WITH PROPOFOL;  Surgeon: [name], MD;  Location: [facility] ENDOSCOPY;  Service: Gastroenterology;  Laterality: N/A;  . COLONOSCOPY WITH PROPOFOL N/A 07/30/2018   Procedure: COLONOSCOPY WITH PROPOFOL;  Surgeon: [name], MD;  Location: [facility] ENDOSCOPY;  Service: Gastroenterology;  Laterality: N/A;  . COLONOSCOPY WITH PROPOFOL N/A 10/10/2018   Procedure: COLONOSCOPY WITH PROPOFOL;  Surgeon: Lucilla Lame, MD;  Location: [facility] ENDOSCOPY;  Service: Endoscopy;  Laterality: N/A;  . DIAGNOSTIC LAPAROSCOPY    . ESOPHAGOGASTRODUODENOSCOPY (EGD) WITH PROPOFOL N/A 12/20/2017   Procedure: ESOPHAGOGASTRODUODENOSCOPY (EGD) WITH PROPOFOL;  Surgeon: [name], MD;  Location: [facility];  Service: Gastroenterology;  Laterality: N/A;  . ESOPHAGOGASTRODUODENOSCOPY (EGD) WITH PROPOFOL  07/30/2018   Procedure: ESOPHAGOGASTRODUODENOSCOPY (EGD) WITH PROPOFOL;  Surgeon: [name], MD;  Location: [facility] ENDOSCOPY;  Service: Gastroenterology;;  . Otho Darner SIGMOIDOSCOPY N/A 11/21/2018   Procedure: FLEXIBLE SIGMOIDOSCOPY;  Surgeon: [name], MD;  Location: [facility] ENDOSCOPY;  Service: Gastroenterology;  Laterality: N/A;  . LAPAROTOMY N/A 08/31/2017   Procedure: EXPLORATORY LAPAROTOMY for SBO, ileocolectomy, removal of piece of uterine wall;  Surgeon: [name], MD;  Location: [facility] ORS;  Service: General;  Laterality: N/A;  . LASER ABLATION CONDOLAMATA N/A 02/22/2018   Procedure: LASER ABLATION/REMOVAL OF [id number] AROUND ANUS AND VAGINA;  Surgeon: Michael [place], MD;  Location: [facility];  Service: General;  Laterality: N/A;  . OOPHORECTOMY    . PORTA CATH INSERTION N/A 05/13/2018   Procedure: PORTA CATH INSERTION;  Surgeon: Katha Cabal, MD;  Location: [facility] CV LAB;  Service:  Cardiovascular;  Laterality: N/A;  . SMALL INTESTINE SURGERY    . TANDEM RING INSERTION     x3  . THORACOTOMY    . TOTAL KNEE ARTHROPLASTY Left 04/24/2018   Procedure: TOTAL KNEE ARTHROPLASTY;  Surgeon: Lovell Sheehan, MD;  Location: [facility] ORS;  Service: Orthopedics;  Laterality: Left;     Family History: Family History  Problem Relation Age of Onset  . Hypertension Father   . Diabetes Father   . Alcohol abuse Daughter   . Hypertension Maternal Grandmother   . Diabetes Maternal Grandmother   . Diabetes Paternal Grandmother   . Hypertension Paternal Grandmother     Social History: Social History   Socioeconomic History  . Marital status: Divorced    Spouse name: Not on file  . Number of children: Not on file  . Years of education: Not on file  . Highest education level: Not on file  Occupational History  . Not on file  Social Needs  . Financial resource strain: Not hard at all  . Food insecurity    Worry: Never true    Inability: Never true  . Transportation needs    Medical: No    Non-medical: No  Tobacco Use  . Smoking status: Former Smoker    Packs/day: 0.25    Years: 10.00    Pack years: 2.50    Types: Cigarettes    Quit date: 10/16/2006    Years since quitting: 12.8  . Smokeless tobacco: Never Used  Substance and Sexual Activity  . Alcohol use: Not Currently    Frequency: Never    Comment: seldom  . Drug use: Yes    Types: Marijuana    Comment: not very often  . Sexual activity: Not Currently    Birth control/protection: Post-menopausal    Comment: Not Asked  Lifestyle  . Physical activity    Days per week: Patient refused    Minutes per session: Patient refused  . Stress: Only a little  Relationships  . Social Herbalist on phone: Patient refused    Gets together: Patient refused    Attends religious service: Patient refused    Active member of club or organization: Patient refused    Attends meetings of clubs or organizations: Patient  refused    Relationship status: Patient refused  . Intimate partner violence    Fear of current or ex partner: No    Emotionally abused: No    Physically abused: No    Forced sexual activity: No  Other Topics Concern  . Not on file  Social History Narrative  . Not on file    Allergies: Allergies  Allergen Reactions  . Ketamine Anxiety and Other (See Comments)    Syncope episode/confusion     Current Medications: Current Outpatient Medications  Medication Sig Dispense Refill  . amitriptyline (ELAVIL) 75 MG tablet  Take 1 tablet (75 mg total) by mouth at bedtime. 90 tablet 1  . Calcium Carb-Cholecalciferol (CALCIUM 500 +D) 500-400 MG-UNIT TABS Take 2 tablets by mouth daily.    Marland Kitchen dexamethasone (DECADRON) 4 MG tablet Take one pill AM & PM x 3 days; start the day prior to chemo. 60 tablet 0  . diphenoxylate-atropine (LOMOTIL) 2.5-0.025 MG tablet TAKE (2) TABLETS FOUR TIMES DAILY. 240 tablet 0  . famotidine (PEPCID) 20 MG tablet Take 1 tablet (20 mg total) by mouth 2 (two) times daily for 5 days. (Patient taking differently: Take 20 mg by mouth 2 (two) times daily as needed. ) 10 tablet 0  . folic acid (FOLVITE) 1 MG tablet Take 1 tablet (1 mg total) by mouth daily. 30 tablet 6  . hydrocortisone (CORTEF) 20 MG tablet Take 1 tablet (20 mg total) by mouth daily. 30 tablet 0  . hydrOXYzine (ATARAX/VISTARIL) 10 MG tablet Take 1 tablet (10 mg total) by mouth 3 (three) times daily as needed. 60 tablet 0  . hyoscyamine (LEVBID) 0.375 MG 12 hr tablet Take 1 tablet (0.375 mg total) by mouth 2 (two) times daily. 60 tablet 0  . ondansetron (ZOFRAN ODT) 4 MG disintegrating tablet Take 1 tablet (4 mg total) by mouth every 8 (eight) hours as needed for nausea or vomiting. 30 tablet 2  . oxyCODONE (OXYCONTIN) 15 mg 12 hr tablet Take 1 tablet (15 mg total) by mouth every 12 (twelve) hours. 60 tablet 0  . Oxycodone HCl 10 MG TABS Take 1 tablet (10 mg total) by mouth every 6 (six) hours as needed (for  breakthrough pain). 120 tablet 0  . pantoprazole (PROTONIX) 40 MG tablet Take 1 tablet (40 mg total) by mouth daily. 90 tablet 1  . potassium chloride SA (K-DUR) 20 MEQ tablet Take 1 tablet (20 mEq total) by mouth daily as needed (if having nausea/vomiting- to supplements for lost potassium). 30 tablet 0  . promethazine (PHENERGAN) 25 MG tablet Take 1 tablet (25 mg total) by mouth every 8 (eight) hours as needed for nausea or vomiting. 30 tablet 0  . rivaroxaban (XARELTO) 20 MG TABS tablet Take 1 tablet (20 mg total) by mouth daily with supper. (Patient taking differently: Take 20 mg by mouth daily with supper. Takes in the morning) 30 tablet 6  . VENTOLIN HFA 108 (90 Base) MCG/ACT inhaler Inhale 1-2 puffs into the lungs every 4 (four) hours as needed for shortness of breath. 1 Inhaler 1   No current facility-administered medications for this visit.    Facility-Administered Medications Ordered in Other Visits  Medication Dose Route Frequency Provider Last Rate Last Dose  . heparin lock flush 100 unit/mL  500 Units Intravenous Once Corcoran, Melissa C, MD      . sodium chloride 0.9 % 1,000 mL with potassium chloride 20 mEq infusion   Intravenous Once Honor Loh E, NP      . sodium chloride flush (NS) 0.9 % injection 10 mL  10 mL Intravenous Once Borders, Kirt Boys, NP       Review of Systems General:  Fatigue, weakness Skin: no complaints Eyes: no complaints HEENT: no complaints Breasts: no complaints Pulmonary: shortness of breath Cardiac: no complaints Gastrointestinal: abdominal bloating, distention, nausea, chronic diarrhea Genitourinary/Sexual: dysuria Ob/Gyn: no complaints Musculoskeletal: chronic back pain Hematology: no complaints Neurologic/Psych: depression  Objective:  Physical Examination:  BP 124/86 Comment: manual  Pulse (!) 108   Resp 16   Wt 172 lb 6.4 oz (78.2 kg)  BMI 26.21 kg/m    ECOG Performance Status: 1 - Symptomatic but completely ambulatory  GENERAL:  Patient is chronically ill appearing female in no acute distress HEENT:  Sclera clear. Anicteric NODES:  Negative axillary, supraclavicular, inguinal lymph node survery LUNGS:  Clear to auscultation bilaterally.   HEART:  Regular rate and rhythm.  ABDOMEN:  Soft, nontender. No masses or ascites EXTREMITIES:  No peripheral edema. Atraumatic. No cyanosis SKIN:  Clear with no obvious rashes or skin changes.  NEURO:  Nonfocal. Well oriented.  Appropriate affect.  Pelvic: exam chaperoned by nurse; Vulva normal, Vagina somewhat shortened with radiation effect. Narrow speculum tolerated well.  Atrophic, mucosa pale, no lesions, but spots easily. Cervix: flush with vault, no lesions. Uterus: not palpable. Bimanual/RV:normal  No masses or nodularity.     Assessment:  Joanna Hall is a 53 y.o. female diagnosed with recurrent stage IB1 cervical adenocarcinoma (PDL1 positive) s/p aborted radical hysterectomy due to infected adnexa 2/18 at [facility] followed by chemo-radiation at [facility] with apparent complete response.  She developed a bowel obstruction and had resection of terminal ileum and cecum 11/18 at [facility].  Imaging from 04/2018 revealed lung metastases. She initiated carboplatin-taxol on 06/04/18. Course complicated by chronic diarrhea and multiple GI issues. Single agent weekly taxol and then Keytruda with progression in the lungs this year.  Multiple ED and hospital admissions the past few months.  No evidence of pelvic disease on exam today.  Medical co-morbidities complicating care:untreated Hepatitis C .  Plan:   Problem List Items Addressed This Visit      Genitourinary   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[place]) - Primary      She will continue chemotherapy with Dr Rogue Bussing with Alimta now. Also will continue symptom management with palliative care.  Discussed that she will likely need to stop treatment at some point soon and consider hospice in view of low likelihood of response and side  effects.  She understands this, but wishes to continue treatment presently.   We can see her back for follow up as needed.  Mellody Drown, MD   CC: Dr. Rogue Bussing, [facility]

## 2019-08-07 ENCOUNTER — Other Ambulatory Visit: Payer: Self-pay | Admitting: Nurse Practitioner

## 2019-08-07 ENCOUNTER — Inpatient Hospital Stay: Payer: Medicaid Other

## 2019-08-07 ENCOUNTER — Other Ambulatory Visit: Payer: Self-pay

## 2019-08-07 DIAGNOSIS — C530 Malignant neoplasm of endocervix: Secondary | ICD-10-CM | POA: Diagnosis not present

## 2019-08-07 DIAGNOSIS — Z7189 Other specified counseling: Secondary | ICD-10-CM

## 2019-08-07 DIAGNOSIS — C538 Malignant neoplasm of overlapping sites of cervix uteri: Secondary | ICD-10-CM

## 2019-08-07 LAB — COMPREHENSIVE METABOLIC PANEL
ALT: 95 U/L — ABNORMAL HIGH (ref 0–44)
AST: 65 U/L — ABNORMAL HIGH (ref 15–41)
Albumin: 3.9 g/dL (ref 3.5–5.0)
Alkaline Phosphatase: 191 U/L — ABNORMAL HIGH (ref 38–126)
Anion gap: 8 (ref 5–15)
BUN: 14 mg/dL (ref 6–20)
CO2: 27 mmol/L (ref 22–32)
Calcium: 9.5 mg/dL (ref 8.9–10.3)
Chloride: 100 mmol/L (ref 98–111)
Creatinine, Ser: 0.73 mg/dL (ref 0.44–1.00)
GFR calc Af Amer: >60 mL/min (ref >60)
GFR calc non Af Amer: >60 mL/min (ref >60)
Glucose, Bld: 96 mg/dL (ref 70–99)
Potassium: 4 mmol/L (ref 3.5–5.1)
Sodium: 135 mmol/L (ref 135–145)
Total Bilirubin: 0.7 mg/dL (ref 0.3–1.2)
Total Protein: 7.7 g/dL (ref 6.5–8.1)

## 2019-08-07 LAB — CBC WITH DIFFERENTIAL/PLATELET
Abs Immature Granulocytes: 0.02 10*3/uL (ref 0.00–0.07)
Basophils Absolute: 0 10*3/uL (ref 0.0–0.1)
Basophils Relative: 0 %
Eosinophils Absolute: 0 10*3/uL (ref 0.0–0.5)
Eosinophils Relative: 1 %
HCT: 39.5 % (ref 36.0–46.0)
Hemoglobin: 13.1 g/dL (ref 12.0–15.0)
Immature Granulocytes: 1 %
Lymphocytes Relative: 15 %
Lymphs Abs: 0.5 10*3/uL — ABNORMAL LOW (ref 0.7–4.0)
MCH: 33.1 pg (ref 26.0–34.0)
MCHC: 33.2 g/dL (ref 30.0–36.0)
MCV: 99.7 fL (ref 80.0–100.0)
Monocytes Absolute: 0.2 10*3/uL (ref 0.1–1.0)
Monocytes Relative: 5 %
Neutro Abs: 2.7 10*3/uL (ref 1.7–7.7)
Neutrophils Relative %: 78 %
Platelets: 173 10*3/uL (ref 150–400)
RBC: 3.96 MIL/uL (ref 3.87–5.11)
RDW: 14.6 % (ref 11.5–15.5)
WBC: 3.5 10*3/uL — ABNORMAL LOW (ref 4.0–10.5)
nRBC: 0 % (ref 0.0–0.2)

## 2019-08-07 LAB — MAGNESIUM: Magnesium: 1.6 mg/dL — ABNORMAL LOW (ref 1.7–2.4)

## 2019-08-07 MED ORDER — SODIUM CHLORIDE 0.9% FLUSH
10.0000 mL | Freq: Once | INTRAVENOUS | Status: AC
  Administered 2019-08-07: 10 mL via INTRAVENOUS
  Filled 2019-08-07: qty 10

## 2019-08-07 MED ORDER — SODIUM CHLORIDE 0.9 % IV SOLN
1000.0000 mg | Freq: Once | INTRAVENOUS | Status: AC
  Administered 2019-08-07: 1000 mg via INTRAVENOUS
  Filled 2019-08-07: qty 40

## 2019-08-07 MED ORDER — SODIUM CHLORIDE 0.9 % IV SOLN
Freq: Once | INTRAVENOUS | Status: AC
  Administered 2019-08-07: 10:00:00 via INTRAVENOUS
  Filled 2019-08-07: qty 250

## 2019-08-07 MED ORDER — HEPARIN SOD (PORK) LOCK FLUSH 100 UNIT/ML IV SOLN
500.0000 [IU] | Freq: Once | INTRAVENOUS | Status: AC | PRN
  Administered 2019-08-07: 500 [IU]
  Filled 2019-08-07: qty 5

## 2019-08-07 MED ORDER — PROCHLORPERAZINE MALEATE 10 MG PO TABS
10.0000 mg | ORAL_TABLET | Freq: Once | ORAL | Status: AC
  Administered 2019-08-07: 10 mg via ORAL
  Filled 2019-08-07: qty 1

## 2019-08-07 MED ORDER — DEXAMETHASONE 4 MG PO TABS
4.0000 mg | ORAL_TABLET | Freq: Once | ORAL | Status: AC
  Administered 2019-08-07: 4 mg via ORAL
  Filled 2019-08-07: qty 1

## 2019-08-07 MED ORDER — CYANOCOBALAMIN 1000 MCG/ML IJ SOLN
1000.0000 ug | Freq: Once | INTRAMUSCULAR | Status: AC
  Administered 2019-08-07: 1000 ug via INTRAMUSCULAR
  Filled 2019-08-07: qty 1

## 2019-08-07 MED ORDER — SODIUM CHLORIDE 0.9 % IV SOLN
Freq: Once | INTRAVENOUS | Status: AC
  Administered 2019-08-07: 11:00:00 via INTRAVENOUS
  Filled 2019-08-07: qty 250

## 2019-08-07 NOTE — Progress Notes (Signed)
RN made NP aware that pt.'s port is not giving any blood return after multiple NS flushes. Port flushes with ease and pt states that there is no pain or discomfort, there is no swelling or redness while port was being flushed. Verbal orders to continue with treatment today. RN reviewed labs with NP and treatment team. Verbal order to proceed with treatment today, 1L of NS over a hour, B-12 shot, and hold magnesium. Pt tolerated Alimta infusion well with no signs of complications or signs of reaction. RN educated the patient on the importance of notifying the clinic if complications arise at home and to call 911 if it is an emergency. Pt updated and all questions answered at this time.   Demita Tobia [employer]

## 2019-08-13 ENCOUNTER — Other Ambulatory Visit: Payer: Self-pay

## 2019-08-13 ENCOUNTER — Encounter: Payer: Self-pay | Admitting: Internal Medicine

## 2019-08-13 ENCOUNTER — Emergency Department: Payer: Medicaid Other

## 2019-08-13 ENCOUNTER — Inpatient Hospital Stay
Admission: EM | Admit: 2019-08-13 | Discharge: 2019-08-19 | DRG: 755 | Disposition: A | Discharge status: Home / Self Care | Payer: Medicaid Other | Attending: Family Medicine | Admitting: Family Medicine

## 2019-08-13 ENCOUNTER — Telehealth: Payer: Self-pay | Admitting: *Deleted

## 2019-08-13 ENCOUNTER — Encounter: Payer: Self-pay | Admitting: Emergency Medicine

## 2019-08-13 DIAGNOSIS — Z66 Do not resuscitate: Secondary | ICD-10-CM | POA: Diagnosis present

## 2019-08-13 DIAGNOSIS — I700 Atherosclerosis of aorta: Secondary | ICD-10-CM | POA: Diagnosis present

## 2019-08-13 DIAGNOSIS — R1030 Lower abdominal pain, unspecified: Secondary | ICD-10-CM | POA: Diagnosis not present

## 2019-08-13 DIAGNOSIS — E2740 Unspecified adrenocortical insufficiency: Secondary | ICD-10-CM | POA: Diagnosis not present

## 2019-08-13 DIAGNOSIS — D6851 Activated protein C resistance: Secondary | ICD-10-CM | POA: Diagnosis present

## 2019-08-13 DIAGNOSIS — D6959 Other secondary thrombocytopenia: Secondary | ICD-10-CM | POA: Diagnosis present

## 2019-08-13 DIAGNOSIS — M5136 Other intervertebral disc degeneration, lumbar region: Secondary | ICD-10-CM | POA: Diagnosis present

## 2019-08-13 DIAGNOSIS — Z8249 Family history of ischemic heart disease and other diseases of the circulatory system: Secondary | ICD-10-CM

## 2019-08-13 DIAGNOSIS — K529 Noninfective gastroenteritis and colitis, unspecified: Secondary | ICD-10-CM | POA: Diagnosis present

## 2019-08-13 DIAGNOSIS — Z86711 Personal history of pulmonary embolism: Secondary | ICD-10-CM

## 2019-08-13 DIAGNOSIS — D759 Disease of blood and blood-forming organs, unspecified: Secondary | ICD-10-CM | POA: Diagnosis not present

## 2019-08-13 DIAGNOSIS — I701 Atherosclerosis of renal artery: Secondary | ICD-10-CM | POA: Diagnosis present

## 2019-08-13 DIAGNOSIS — Y929 Unspecified place or not applicable: Secondary | ICD-10-CM

## 2019-08-13 DIAGNOSIS — Z20828 Contact with and (suspected) exposure to other viral communicable diseases: Secondary | ICD-10-CM | POA: Diagnosis present

## 2019-08-13 DIAGNOSIS — K921 Melena: Secondary | ICD-10-CM | POA: Diagnosis present

## 2019-08-13 DIAGNOSIS — D696 Thrombocytopenia, unspecified: Secondary | ICD-10-CM

## 2019-08-13 DIAGNOSIS — Z96652 Presence of left artificial knee joint: Secondary | ICD-10-CM | POA: Diagnosis present

## 2019-08-13 DIAGNOSIS — K219 Gastro-esophageal reflux disease without esophagitis: Secondary | ICD-10-CM | POA: Diagnosis present

## 2019-08-13 DIAGNOSIS — Z888 Allergy status to other drugs, medicaments and biological substances status: Secondary | ICD-10-CM | POA: Diagnosis not present

## 2019-08-13 DIAGNOSIS — Z86718 Personal history of other venous thrombosis and embolism: Secondary | ICD-10-CM

## 2019-08-13 DIAGNOSIS — Z7901 Long term (current) use of anticoagulants: Secondary | ICD-10-CM

## 2019-08-13 DIAGNOSIS — T451X5A Adverse effect of antineoplastic and immunosuppressive drugs, initial encounter: Secondary | ICD-10-CM | POA: Diagnosis not present

## 2019-08-13 DIAGNOSIS — B182 Chronic viral hepatitis C: Secondary | ICD-10-CM | POA: Diagnosis present

## 2019-08-13 DIAGNOSIS — C7800 Secondary malignant neoplasm of unspecified lung: Secondary | ICD-10-CM | POA: Diagnosis not present

## 2019-08-13 DIAGNOSIS — Z79891 Long term (current) use of opiate analgesic: Secondary | ICD-10-CM

## 2019-08-13 DIAGNOSIS — Z833 Family history of diabetes mellitus: Secondary | ICD-10-CM

## 2019-08-13 DIAGNOSIS — Z9049 Acquired absence of other specified parts of digestive tract: Secondary | ICD-10-CM

## 2019-08-13 DIAGNOSIS — R112 Nausea with vomiting, unspecified: Secondary | ICD-10-CM

## 2019-08-13 DIAGNOSIS — F419 Anxiety disorder, unspecified: Secondary | ICD-10-CM | POA: Diagnosis present

## 2019-08-13 DIAGNOSIS — C539 Malignant neoplasm of cervix uteri, unspecified: Principal | ICD-10-CM | POA: Diagnosis present

## 2019-08-13 DIAGNOSIS — E876 Hypokalemia: Secondary | ICD-10-CM | POA: Diagnosis not present

## 2019-08-13 DIAGNOSIS — G8929 Other chronic pain: Secondary | ICD-10-CM | POA: Diagnosis present

## 2019-08-13 DIAGNOSIS — Z87891 Personal history of nicotine dependence: Secondary | ICD-10-CM

## 2019-08-13 DIAGNOSIS — Z8711 Personal history of peptic ulcer disease: Secondary | ICD-10-CM

## 2019-08-13 DIAGNOSIS — D701 Agranulocytosis secondary to cancer chemotherapy: Secondary | ICD-10-CM | POA: Diagnosis not present

## 2019-08-13 DIAGNOSIS — Z79899 Other long term (current) drug therapy: Secondary | ICD-10-CM

## 2019-08-13 DIAGNOSIS — Z515 Encounter for palliative care: Secondary | ICD-10-CM | POA: Diagnosis not present

## 2019-08-13 DIAGNOSIS — R109 Unspecified abdominal pain: Secondary | ICD-10-CM | POA: Diagnosis present

## 2019-08-13 LAB — URINALYSIS, COMPLETE (UACMP) WITH MICROSCOPIC
Bacteria, UA: NONE SEEN
Bilirubin Urine: NEGATIVE
Glucose, UA: NEGATIVE mg/dL
Hgb urine dipstick: NEGATIVE
Ketones, ur: 5 mg/dL — AB
Leukocytes,Ua: NEGATIVE
Nitrite: NEGATIVE
Protein, ur: NEGATIVE mg/dL
Specific Gravity, Urine: 1.029 (ref 1.005–1.030)
Squamous Epithelial / HPF: NONE SEEN (ref 0–5)
pH: 7 (ref 5.0–8.0)

## 2019-08-13 LAB — PHOSPHORUS: Phosphorus: 3.3 mg/dL (ref 2.5–4.6)

## 2019-08-13 LAB — COMPREHENSIVE METABOLIC PANEL
ALT: 144 U/L — ABNORMAL HIGH (ref 0–44)
AST: 69 U/L — ABNORMAL HIGH (ref 15–41)
Albumin: 3.6 g/dL (ref 3.5–5.0)
Alkaline Phosphatase: 154 U/L — ABNORMAL HIGH (ref 38–126)
Anion gap: 13 (ref 5–15)
BUN: 14 mg/dL (ref 6–20)
CO2: 22 mmol/L (ref 22–32)
Calcium: 9 mg/dL (ref 8.9–10.3)
Chloride: 100 mmol/L (ref 98–111)
Creatinine, Ser: 0.77 mg/dL (ref 0.44–1.00)
GFR calc Af Amer: >60 mL/min (ref >60)
GFR calc non Af Amer: >60 mL/min (ref >60)
Glucose, Bld: 113 mg/dL — ABNORMAL HIGH (ref 70–99)
Potassium: 3.5 mmol/L (ref 3.5–5.1)
Sodium: 135 mmol/L (ref 135–145)
Total Bilirubin: 1.2 mg/dL (ref 0.3–1.2)
Total Protein: 7.1 g/dL (ref 6.5–8.1)

## 2019-08-13 LAB — CBC WITH DIFFERENTIAL/PLATELET
Abs Immature Granulocytes: 0.01 10*3/uL (ref 0.00–0.07)
Basophils Absolute: 0 10*3/uL (ref 0.0–0.1)
Basophils Relative: 0 %
Eosinophils Absolute: 0 10*3/uL (ref 0.0–0.5)
Eosinophils Relative: 0 %
HCT: 42.4 % (ref 36.0–46.0)
Hemoglobin: 14.2 g/dL (ref 12.0–15.0)
Immature Granulocytes: 1 %
Lymphocytes Relative: 58 %
Lymphs Abs: 0.6 10*3/uL — ABNORMAL LOW (ref 0.7–4.0)
MCH: 32.6 pg (ref 26.0–34.0)
MCHC: 33.5 g/dL (ref 30.0–36.0)
MCV: 97.2 fL (ref 80.0–100.0)
Monocytes Absolute: 0 10*3/uL — ABNORMAL LOW (ref 0.1–1.0)
Monocytes Relative: 3 %
Neutro Abs: 0.4 10*3/uL — ABNORMAL LOW (ref 1.7–7.7)
Neutrophils Relative %: 38 %
Platelets: 116 10*3/uL — ABNORMAL LOW (ref 150–400)
RBC: 4.36 MIL/uL (ref 3.87–5.11)
RDW: 13.7 % (ref 11.5–15.5)
Smear Review: NORMAL
WBC: 1 10*3/uL — CL (ref 4.0–10.5)
nRBC: 0 % (ref 0.0–0.2)

## 2019-08-13 LAB — PROTIME-INR
INR: 1 (ref 0.8–1.2)
Prothrombin Time: 13 seconds (ref 11.4–15.2)

## 2019-08-13 LAB — SARS CORONAVIRUS 2 (TAT 6-24 HRS): SARS Coronavirus 2: NEGATIVE

## 2019-08-13 LAB — PATHOLOGIST SMEAR REVIEW

## 2019-08-13 LAB — URIC ACID: Uric Acid, Serum: 5 mg/dL (ref 2.5–7.1)

## 2019-08-13 LAB — LIPASE, BLOOD: Lipase: 18 U/L (ref 11–51)

## 2019-08-13 LAB — MAGNESIUM: Magnesium: 1.6 mg/dL — ABNORMAL LOW (ref 1.7–2.4)

## 2019-08-13 MED ORDER — HYOSCYAMINE SULFATE ER 0.375 MG PO TB12
0.3750 mg | ORAL_TABLET | Freq: Two times a day (BID) | ORAL | Status: DC
  Administered 2019-08-13 – 2019-08-19 (×12): 0.375 mg via ORAL
  Filled 2019-08-13 (×13): qty 1

## 2019-08-13 MED ORDER — COLCHICINE 0.6 MG PO TABS
0.6000 mg | ORAL_TABLET | Freq: Two times a day (BID) | ORAL | Status: DC
  Administered 2019-08-13 – 2019-08-19 (×12): 0.6 mg via ORAL
  Filled 2019-08-13 (×14): qty 1

## 2019-08-13 MED ORDER — TBO-FILGRASTIM 480 MCG/0.8ML ~~LOC~~ SOSY
480.0000 ug | PREFILLED_SYRINGE | Freq: Every day | SUBCUTANEOUS | Status: AC
  Administered 2019-08-14 – 2019-08-15 (×2): 480 ug via SUBCUTANEOUS
  Filled 2019-08-13 (×4): qty 0.8

## 2019-08-13 MED ORDER — HYDROMORPHONE HCL 1 MG/ML IJ SOLN
1.0000 mg | Freq: Once | INTRAMUSCULAR | Status: AC
  Administered 2019-08-13: 1 mg via INTRAVENOUS
  Filled 2019-08-13: qty 1

## 2019-08-13 MED ORDER — ONDANSETRON HCL 4 MG PO TABS: 4.0000 mg | ORAL_TABLET | Freq: Four times a day (QID) | ORAL | Status: DC | PRN

## 2019-08-13 MED ORDER — HYDROXYZINE HCL 10 MG PO TABS
10.0000 mg | ORAL_TABLET | Freq: Three times a day (TID) | ORAL | Status: DC | PRN
  Administered 2019-08-15: 22:00:00 10 mg via ORAL
  Filled 2019-08-13 (×2): qty 1

## 2019-08-13 MED ORDER — ALBUTEROL SULFATE (2.5 MG/3ML) 0.083% IN NEBU: 2.5000 mg | INHALATION_SOLUTION | Freq: Four times a day (QID) | RESPIRATORY_TRACT | Status: DC | PRN

## 2019-08-13 MED ORDER — SODIUM CHLORIDE 0.9 % IV SOLN
INTRAVENOUS | Status: DC
  Administered 2019-08-13 – 2019-08-14 (×3): via INTRAVENOUS

## 2019-08-13 MED ORDER — IOHEXOL 300 MG/ML  SOLN
100.0000 mL | Freq: Once | INTRAMUSCULAR | Status: AC | PRN
  Administered 2019-08-13: 100 mL via INTRAVENOUS

## 2019-08-13 MED ORDER — HYDROCORTISONE NA SUCCINATE PF 100 MG IJ SOLR
60.0000 mg | Freq: Every day | INTRAMUSCULAR | Status: AC
  Administered 2019-08-14 – 2019-08-16 (×3): 60 mg via INTRAVENOUS
  Filled 2019-08-13 (×3): qty 1.2

## 2019-08-13 MED ORDER — OXYCODONE HCL 5 MG PO TABS
10.0000 mg | ORAL_TABLET | Freq: Four times a day (QID) | ORAL | Status: DC | PRN
  Administered 2019-08-13 – 2019-08-14 (×2): 10 mg via ORAL
  Filled 2019-08-13 (×2): qty 2

## 2019-08-13 MED ORDER — PROMETHAZINE HCL 25 MG/ML IJ SOLN
25.0000 mg | Freq: Once | INTRAMUSCULAR | Status: AC
  Administered 2019-08-13: 25 mg via INTRAVENOUS
  Filled 2019-08-13: qty 1

## 2019-08-13 MED ORDER — RIVAROXABAN 20 MG PO TABS
20.0000 mg | ORAL_TABLET | Freq: Every day | ORAL | Status: DC
  Administered 2019-08-13: 20 mg via ORAL
  Filled 2019-08-13 (×2): qty 1

## 2019-08-13 MED ORDER — ACETAMINOPHEN 650 MG RE SUPP: 650.0000 mg | Freq: Four times a day (QID) | RECTAL | Status: DC | PRN

## 2019-08-13 MED ORDER — DIPHENHYDRAMINE HCL 50 MG/ML IJ SOLN
25.0000 mg | Freq: Once | INTRAMUSCULAR | Status: AC
  Administered 2019-08-13: 25 mg via INTRAVENOUS
  Filled 2019-08-13: qty 1

## 2019-08-13 MED ORDER — FOLIC ACID 1 MG PO TABS
1.0000 mg | ORAL_TABLET | Freq: Every day | ORAL | Status: DC
  Administered 2019-08-13 – 2019-08-19 (×7): 1 mg via ORAL
  Filled 2019-08-13 (×7): qty 1

## 2019-08-13 MED ORDER — ONDANSETRON HCL 4 MG/2ML IJ SOLN
4.0000 mg | Freq: Four times a day (QID) | INTRAMUSCULAR | Status: DC | PRN
  Administered 2019-08-13 – 2019-08-14 (×2): 4 mg via INTRAVENOUS
  Filled 2019-08-13 (×2): qty 2

## 2019-08-13 MED ORDER — SODIUM CHLORIDE 0.9 % IV BOLUS
1000.0000 mL | Freq: Once | INTRAVENOUS | Status: AC
  Administered 2019-08-13: 1000 mL via INTRAVENOUS

## 2019-08-13 MED ORDER — SENNOSIDES-DOCUSATE SODIUM 8.6-50 MG PO TABS: 1.0000 | ORAL_TABLET | Freq: Every evening | ORAL | Status: DC | PRN

## 2019-08-13 MED ORDER — METOCLOPRAMIDE HCL 5 MG/ML IJ SOLN
10.0000 mg | Freq: Once | INTRAMUSCULAR | Status: AC
  Administered 2019-08-13: 10 mg via INTRAVENOUS
  Filled 2019-08-13: qty 2

## 2019-08-13 MED ORDER — PANTOPRAZOLE SODIUM 40 MG PO TBEC
40.0000 mg | DELAYED_RELEASE_TABLET | Freq: Every day | ORAL | Status: DC
  Administered 2019-08-13 – 2019-08-14 (×2): 40 mg via ORAL
  Filled 2019-08-13 (×2): qty 1

## 2019-08-13 MED ORDER — HYDROMORPHONE HCL 1 MG/ML IJ SOLN
1.0000 mg | INTRAMUSCULAR | Status: DC | PRN
  Administered 2019-08-13 – 2019-08-19 (×19): 1 mg via INTRAVENOUS
  Filled 2019-08-13 (×19): qty 1

## 2019-08-13 MED ORDER — AMITRIPTYLINE HCL 25 MG PO TABS
75.0000 mg | ORAL_TABLET | Freq: Every day | ORAL | Status: DC
  Administered 2019-08-13 – 2019-08-18 (×6): 75 mg via ORAL
  Filled 2019-08-13 (×6): qty 3

## 2019-08-13 MED ORDER — FAMOTIDINE 20 MG PO TABS: 20.0000 mg | ORAL_TABLET | Freq: Two times a day (BID) | ORAL | Status: DC | PRN

## 2019-08-13 MED ORDER — HYDROCORTISONE 10 MG PO TABS
20.0000 mg | ORAL_TABLET | Freq: Every day | ORAL | Status: DC
  Administered 2019-08-13: 20 mg via ORAL
  Filled 2019-08-13: qty 2

## 2019-08-13 MED ORDER — POTASSIUM CHLORIDE CRYS ER 20 MEQ PO TBCR: 20.0000 meq | EXTENDED_RELEASE_TABLET | Freq: Every day | ORAL | Status: DC | PRN

## 2019-08-13 MED ORDER — ALBUTEROL SULFATE (2.5 MG/3ML) 0.083% IN NEBU: 2.5000 mg | INHALATION_SOLUTION | RESPIRATORY_TRACT | Status: DC | PRN

## 2019-08-13 MED ORDER — MAGNESIUM SULFATE 4 GM/100ML IV SOLN
4.0000 g | Freq: Once | INTRAVENOUS | Status: AC
  Administered 2019-08-13: 4 g via INTRAVENOUS
  Filled 2019-08-13: qty 100

## 2019-08-13 MED ORDER — ALBUTEROL SULFATE HFA 108 (90 BASE) MCG/ACT IN AERS: 1.0000 | INHALATION_SPRAY | RESPIRATORY_TRACT | Status: DC | PRN

## 2019-08-13 MED ORDER — OXYCODONE HCL ER 15 MG PO T12A
15.0000 mg | EXTENDED_RELEASE_TABLET | Freq: Two times a day (BID) | ORAL | Status: DC
  Administered 2019-08-13 – 2019-08-14 (×2): 15 mg via ORAL
  Filled 2019-08-13 (×3): qty 1

## 2019-08-13 MED ORDER — CALCIUM CARBONATE-VITAMIN D 500-200 MG-UNIT PO TABS
2.0000 | ORAL_TABLET | Freq: Every day | ORAL | Status: DC
  Administered 2019-08-13 – 2019-08-19 (×7): 2 via ORAL
  Filled 2019-08-13 (×7): qty 2

## 2019-08-13 MED ORDER — BISACODYL 5 MG PO TBEC: 5.0000 mg | DELAYED_RELEASE_TABLET | Freq: Every day | ORAL | Status: DC | PRN

## 2019-08-13 MED ORDER — ACETAMINOPHEN 325 MG PO TABS: 650.0000 mg | ORAL_TABLET | Freq: Four times a day (QID) | ORAL | Status: DC | PRN

## 2019-08-13 NOTE — ED Notes (Signed)
Pt given ice chips at this time

## 2019-08-13 NOTE — H&P (Addendum)
[place] [facility] [place] NAME: Joanna Hall    MR#:  [id number]  DATE OF BIRTH:  1966-03-02  DATE OF ADMISSION:  08/13/2019  PRIMARY CARE PHYSICIAN: Delsa Grana, PA-C   REQUESTING/REFERRING PHYSICIAN: Dr. Joni Fears.  CHIEF COMPLAINT:   Chief Complaint  Patient presents with  . Abdominal Pain  . Nausea   Worsening abdominal pain, nausea and vomiting for 2 days HISTORY OF PRESENT ILLNESS:  Joanna Hall  is a 53 y.o. female with a known history of cervical cancer with lung metastasis, abdominal pain, hepatitis C, GI bleeding, small bowel obstruction etc.  The patient presented to ED with above chief complaints.  She has had severe lower abdominal pain, which is constant, 10 out of 10 based radiation to the back for a couple days.  The patient has cervical cancer with lung metastasis and got chemotherapy 6 days ago.  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  She is treated with medication without improvement in the ED.  ED physician request admission. PAST MEDICAL HISTORY:   Past Medical History:  Diagnosis Date  . Abdominal pain 06/10/2018  . Abnormal cervical Papanicolaou smear 09/18/2017  . Anxiety   . Aortic atherosclerosis ([place])   . Arthritis    neck and knees  . 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 Blood transfusion without reported diagnosis   . Cervical cancer ([place]) 09/2016   mets lung  . Chronic anal fissure   . Chronic diarrhea   . Dyspnea   . Erosive gastropathy 09/18/2017  . Factor V Leiden mutation ([place])   . Fecal incontinence   . Genital warts   . GERD (gastroesophageal reflux disease)   . GI bleed 10/08/2018  . Heart murmur   . Hematochezia   . Hemorrhoids   . 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 Hepatitis, chronic ([facility]) 05/05/2017  . History of cancer chemotherapy    completed 06/2017  . History of Clostridium  difficile infection    while undergoing chemo.  Negative test 10/2017  . Ileocolic anastomotic leak   . 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 Intestinal infection due to Clostridium difficile 09/18/2017  . Macrocytic anemia with vitamin B12 deficiency   . Multiple gastric ulcers   . Nausea vomiting and diarrhea   . Pancolitis ([place]) 07/27/2018  . Perianal condylomata   . Pneumonia    History of  . Pulmonary nodules   . Rectal bleeding   . Small bowel obstruction ([place]) 08/2017  . Stiff neck    limited right turn  . Vitamin D deficiency     PAST SURGICAL HISTORY:   Past Surgical History:  Procedure Laterality Date  . CHOLECYSTECTOMY    . COLON SURGERY  08/2017   resection  . COLONOSCOPY WITH PROPOFOL N/A 12/20/2017   Procedure: COLONOSCOPY WITH PROPOFOL;  Surgeon: [name], MD;  [facility] ENDOSCOPY;  Service: Gastroenterology;  Laterality: N/A;  . COLONOSCOPY WITH PROPOFOL N/A 07/30/2018   Procedure: COLONOSCOPY WITH PROPOFOL;  Surgeon: [name], MD;  Location: [facility] ENDOSCOPY;  Service: Gastroenterology;  Laterality: N/A;  . COLONOSCOPY WITH PROPOFOL N/A 10/10/2018   Procedure: COLONOSCOPY WITH PROPOFOL;  Surgeon: Lucilla Lame, MD;  Location: [facility] ENDOSCOPY;  Service: Endoscopy;  Laterality: N/A;  . DIAGNOSTIC LAPAROSCOPY    . ESOPHAGOGASTRODUODENOSCOPY (  EGD) WITH PROPOFOL N/A 12/20/2017   Procedure: ESOPHAGOGASTRODUODENOSCOPY (EGD) WITH PROPOFOL;  Surgeon: [name], MD;  Location: [facility] ENDOSCOPY;  Service: Gastroenterology;  Laterality: N/A;  . ESOPHAGOGASTRODUODENOSCOPY (EGD) WITH PROPOFOL  07/30/2018   Procedure: ESOPHAGOGASTRODUODENOSCOPY (EGD) WITH PROPOFOL;  Surgeon: [name], MD;  Location: [facility] ENDOSCOPY;  Service: Gastroenterology;;  . Otho Darner SIGMOIDOSCOPY N/A 11/21/2018   Procedure: FLEXIBLE SIGMOIDOSCOPY;  Surgeon: [name], MD;  Location: [facility] ENDOSCOPY;  Service: Gastroenterology;  Laterality:  N/A;  . LAPAROTOMY N/A 08/31/2017   Procedure: EXPLORATORY LAPAROTOMY for SBO, ileocolectomy, removal of piece of uterine wall;  Surgeon: [name], MD;  Location: [facility] ORS;  Service: General;  Laterality: N/A;  . LASER ABLATION CONDOLAMATA N/A 02/22/2018   Procedure: LASER ABLATION/REMOVAL OF [id number] AROUND ANUS AND VAGINA;  Surgeon: Michael Boston, MD;  Location: [place];  Service: General;  Laterality: N/A;  . OOPHORECTOMY    . PORTA CATH INSERTION N/A 05/13/2018   Procedure: PORTA CATH INSERTION;  Surgeon: Katha Cabal, MD;  Location: [employer] CV LAB;  Service: Cardiovascular;  Laterality: N/A;  . SMALL INTESTINE SURGERY    . TANDEM RING INSERTION     x3  . THORACOTOMY    . TOTAL KNEE ARTHROPLASTY Left 04/24/2018   Procedure: TOTAL KNEE ARTHROPLASTY;  Surgeon: Lovell Sheehan, MD;  Location: [facility] ORS;  Service: Orthopedics;  Laterality: Left;    SOCIAL HISTORY:   Social History   Tobacco Use  . Smoking status: Former Smoker    Packs/day: 0.25    Years: 10.00    Pack years: 2.50    Types: Cigarettes    Quit date: 10/16/2006    Years since quitting: 12.8  . Smokeless tobacco: Never Used  Substance Use Topics  . Alcohol use: Not Currently    Frequency: Never    Comment: seldom    FAMILY HISTORY:   Family History  Problem Relation Age of Onset  . Hypertension Father   . Diabetes Father   . Alcohol abuse Daughter   . Hypertension Maternal Grandmother   . Diabetes Maternal Grandmother   . Diabetes Paternal Grandmother   . Hypertension Paternal Grandmother     DRUG ALLERGIES:   Allergies  Allergen Reactions  . Ketamine Anxiety and Other (See Comments)    Syncope episode/confusion     REVIEW OF SYSTEMS:   Review of Systems  Constitutional: Positive for malaise/fatigue. Negative for chills and fever.  HENT: Negative for sore throat.   Eyes: Negative for blurred vision and double vision.  Respiratory: Negative for cough,  hemoptysis, shortness of breath, wheezing and stridor.   Cardiovascular: Negative for chest pain, palpitations, orthopnea and leg swelling.  Gastrointestinal: Positive for abdominal pain, nausea and vomiting. Negative for blood in stool, diarrhea and melena.  Genitourinary: Negative for dysuria, flank pain and hematuria.  Musculoskeletal: Negative for back pain and joint pain.  Skin: Negative for rash.  Neurological: Negative for dizziness, sensory change, focal weakness, seizures, loss of consciousness, weakness and headaches.  Endo/Heme/Allergies: Negative for polydipsia.  Psychiatric/Behavioral: Negative for depression. The patient is not nervous/anxious.     MEDICATIONS AT HOME:   Prior to Admission medications   Medication Sig Start Date End Date Taking? Authorizing Provider  amitriptyline (ELAVIL) 75 MG tablet Take 1 tablet (75 mg total) by mouth at bedtime. 05/06/19 08/13/19 Yes Vanga, Tally Due, MD  Calcium Carb-Cholecalciferol (CALCIUM 500 +D) 500-400 MG-UNIT TABS Take 2 tablets by mouth daily.   Yes [provider]  colchicine 0.6 MG tablet Take 0.6 mg by mouth 2 (two) times daily. 06/19/19  Yes [provider]  dexamethasone (DECADRON) 4 MG tablet Take one pill AM & PM x 3 days; start the day prior to chemo. 08/01/19  Yes Cammie Sickle, MD  diphenoxylate-atropine (LOMOTIL) 2.5-0.025 MG tablet TAKE (2) TABLETS FOUR TIMES DAILY. 04/21/19  Yes Vanga, Tally Due, MD  famotidine (PEPCID) 20 MG tablet Take 1 tablet (20 mg total) by mouth 2 (two) times daily for 5 days. Patient taking differently: Take 20 mg by mouth 2 (two) times daily as needed.  05/23/19 08/13/19 Yes Duffy Bruce, MD  folic acid (FOLVITE) 1 MG tablet Take 1 tablet (1 mg total) by mouth daily. 07/31/19  Yes Cammie Sickle, MD  hydrocortisone (CORTEF) 20 MG tablet Take 1 tablet (20 mg total) by mouth daily. 06/10/19  Yes Gladstone Lighter, MD  hydrOXYzine (ATARAX/VISTARIL) 10 MG tablet  Take 1 tablet (10 mg total) by mouth 3 (three) times daily as needed. 06/13/19  Yes Cammie Sickle, MD  hyoscyamine (LEVBID) 0.375 MG 12 hr tablet Take 1 tablet (0.375 mg total) by mouth 2 (two) times daily. 08/05/19  Yes Vanga, Tally Due, MD  ondansetron (ZOFRAN ODT) 4 MG disintegrating tablet Take 1 tablet (4 mg total) by mouth every 8 (eight) hours as needed for nausea or vomiting. 07/08/19  Yes Cammie Sickle, MD  oxyCODONE (OXYCONTIN) 15 mg 12 hr tablet Take 1 tablet (15 mg total) by mouth every 12 (twelve) hours. 08/01/19  Yes Verlon Au, NP  Oxycodone HCl 10 MG TABS Take 1 tablet (10 mg total) by mouth every 6 (six) hours as needed (for breakthrough pain). 08/04/19  Yes Verlon Au, NP  pantoprazole (PROTONIX) 40 MG tablet Take 1 tablet (40 mg total) by mouth daily. 03/18/19  Yes Cammie Sickle, MD  potassium chloride SA (K-DUR) 20 MEQ tablet Take 1 tablet (20 mEq total) by mouth daily as needed (if having nausea/vomiting- to supplements for lost potassium). 06/10/19  Yes Gladstone Lighter, MD  promethazine (PHENERGAN) 25 MG tablet Take 1 tablet (25 mg total) by mouth every 8 (eight) hours as needed for nausea or vomiting. 07/07/19  Yes Cammie Sickle, MD  rivaroxaban (XARELTO) 20 MG TABS tablet Take 1 tablet (20 mg total) by mouth daily with supper. Patient taking differently: Take 20 mg by mouth daily with supper. Takes in the morning 05/05/19  Yes Cammie Sickle, MD  traMADol (ULTRAM) 50 MG tablet Take 50 mg by mouth every 6 (six) hours as needed.   Yes [provider]  VENTOLIN HFA 108 (90 Base) MCG/ACT inhaler Inhale 1-2 puffs into the lungs every 4 (four) hours as needed for shortness of breath. 02/19/19  Yes Verlon Au, NP      VITAL SIGNS:  Blood pressure (!) 117/93, pulse 89, temperature 98 F (36.7 C), temperature source Oral, resp. rate 19, height 5' 7"  (1.702 m), weight 74.4 kg, SpO2 100 %.  PHYSICAL EXAMINATION:  Physical  Exam  GENERAL:  53 y.o.-year-old patient lying in the bed with no acute distress.  EYES: Pupils equal, round, reactive to light and accommodation. No scleral icterus. Extraocular muscles intact.  HEENT: Head atraumatic, normocephalic. NECK:  Supple, no jugular venous distention. No thyroid enlargement, no tenderness.  LUNGS: Normal breath sounds bilaterally, no wheezing, rales,rhonchi or crepitation. No use of accessory muscles of respiration.  CARDIOVASCULAR: S1, S2 normal. No murmurs, rubs, or gallops.  ABDOMEN: Soft, diffuse tenderness,  nondistended. Bowel sounds present. No organomegaly or mass.  EXTREMITIES: No pedal edema, cyanosis, or clubbing.  NEUROLOGIC: Cranial nerves II through XII are intact. Muscle strength 5/5 in all extremities. Sensation intact. Gait not checked.  PSYCHIATRIC: The patient is alert and oriented x 3.  SKIN: No obvious rash, lesion, or ulcer.   LABORATORY PANEL:   CBC Recent Labs  Lab 08/13/19 0850  WBC 1.0*  HGB 14.2  HCT 42.4  PLT 116*   ------------------------------------------------------------------------------------------------------------------  Chemistries  Recent Labs  Lab 08/13/19 0850  NA 135  K 3.5  CL 100  CO2 22  GLUCOSE 113*  BUN 14  CREATININE 0.77  CALCIUM 9.0  MG 1.6*  AST 69*  ALT 144*  ALKPHOS 154*  BILITOT 1.2   ------------------------------------------------------------------------------------------------------------------  Cardiac Enzymes No results for input(s): TROPONINI in the last 168 hours. ------------------------------------------------------------------------------------------------------------------  RADIOLOGY:  Ct Abdomen Pelvis W Contrast  Result Date: 08/13/2019 CLINICAL DATA:  Abdominal pain EXAM: CT ABDOMEN AND PELVIS WITH CONTRAST TECHNIQUE: Multidetector CT imaging of the abdomen and pelvis was performed using the standard protocol following bolus administration of intravenous contrast.  CONTRAST:  135m OMNIPAQUE IOHEXOL 300 MG/ML  SOLN COMPARISON:  07/22/2019 FINDINGS: Lower chest: Numerous bibasilar cavitary nodules are again seen and are similar prior study. The largest in the right lower lobe measures 16 mm, stable. Hepatobiliary: Prior cholecystectomy. Mild intrahepatic and extrahepatic biliary ductal dilatation. Common bile duct measures up to 13 mm, stable. This is likely related to post cholecystectomy state. No focal hepatic abnormality. Pancreas: No focal abnormality or ductal dilatation. Spleen: No focal abnormality.  Normal size. Adrenals/Urinary Tract: No adrenal abnormality. No focal renal abnormality. No stones or hydronephrosis. Urinary bladder is unremarkable. Stomach/Bowel: Stomach, large and small bowel grossly unremarkable. Vascular/Lymphatic: Aortic atherosclerosis. No enlarged abdominal or pelvic lymph nodes. Reproductive: Uterus and adnexa unremarkable.  No mass. Other: No free fluid or free air. Musculoskeletal: No acute bony abnormality. IMPRESSION: Numerous bilateral lower lobe cavitary nodules, stable since prior study. Stable intrahepatic and extrahepatic biliary ductal dilatation, likely related to post cholecystectomy state. Aortic atherosclerosis. No acute findings in the abdomen or pelvis. Electronically Signed   By: KRolm BaptiseM.D.   On: 08/13/2019 11:07      IMPRESSION AND PLAN:   Abdominal pain, possible related to cervical cancer with lung metastasis. The patient will be admitted to medical floor. Pain control, Zofran as needed, oncology consult.  Neutropenia. due to recent chemotherapy.  Follow-up CBC and oncology consult. Hypomagnesemia.  IV magnesium and follow-up level. Abnormal liver function test.  Follow-up LFT. History of factor V Leiden mutation- continue Xarelto. All the records are reviewed and case discussed with ED provider. Management plans discussed with the patient, family and they are in agreement.  CODE STATUS: Full code.   TOTAL TIME TAKING CARE OF THIS PATIENT: 52 minutes.    QDemetrios LollM.D on 08/13/2019 at 1:08 PM  Between 7am to 6pm - Pager - [phone]  After 6pm go to [email or web address] - pProofreader [facility]  Office  [phone] CC: Primary care physician; TDelsa Grana PA-C   Note: This dictation was prepared with Dragon dictation along with smaller phrase technology. Any transcriptional errors that result from this process are unin

## 2019-08-13 NOTE — Progress Notes (Signed)
Advanced Care Plan.  Purpose of Encounter: CODE STATUS. Parties in Attendance: The patient and me. Patient's Decisional Capacity: Yes. Medical Story: Joanna Hall  is a 53 y.o. female with a known history of cervical cancer with lung metastasis, abdominal pain, hepatitis C, GI bleeding, small bowel obstruction etc.   the patient is being admitted for intractable abdominal pain, neutropenia and hypomagnesemia.  I discussed with patient about her current condition, prognosis and CODE STATUS.  The patient does want to be resuscitated and intubated if she has cardiopulmonary arrest. Goals of Care Determinations: Palliative care. Plan:  Code Status: Full code. Time spent discussing advance care planning: 17 minutes.

## 2019-08-13 NOTE — Telephone Encounter (Signed)
Patient called wanting [place] to know that she is in the ER and will probably be admitted

## 2019-08-13 NOTE — ED Triage Notes (Signed)
PT arrives with complaints of lower abdominal pain and n/v that started 2 days ago. Pt given 500 cc of NS and 82m of zofran prior to arrival.

## 2019-08-13 NOTE — Plan of Care (Signed)
  Problem: Activity: Goal: Risk for activity intolerance will decrease Outcome: Completed/Met

## 2019-08-13 NOTE — Consult Note (Signed)
Hematology/Oncology Consult note [facility] [phone] [phone]  Patient Care Team: Delsa Grana, PA-C as PCP - General (Family Medicine) Mellody Drown, MD as Consulting Physician (Obstetrics and Gynecology) [name], MD as Consulting Physician (Gastroenterology) Michael Boston, MD as Consulting Physician (General Surgery) Lovell Sheehan, MD as Consulting Physician (Orthopedic Surgery) Cammie Sickle, MD as Consulting Physician (Oncology) Earnestine Leys, MD as Consulting Physician (Orthopedic Surgery)   Name of the patient: Joanna Hall  [id number]  [birth year]   Date of visit: 08/13/19 REASON FOR COSULTATION:  Cervical cancer with lung metastasis.  Cytopenia History of presenting illness-  53 y.o. female with known history of cervical cancer with lung metastasis, chronic diarrhea, hepatitis C, GI bleeding, history of small bowel obstruction who presents to ER for evaluation of severe abdominal pain, constant, 10 out of 10, rating to the back for the past few days. Patient has history of cervical cancer with lung metastasis.  Follows up with Dr. Rogue Bussing. Recently switched from [place] to Alimta and status post 1 cycle of Alimta 1 week ago. In the emergency room, patient was found to have leukopenia with WBC 1.  ANC 8.4.  Platelet count 116. CT abdomen pelvis showed numerous bilateral lower lobe cavity nodules.  Stable since prior study.  Stable intrahepatic and extrahepatic.  Duct dilation.  No acute findings. Denies any fever, chills.  Patient endorses nausea and vomiting for few days. Patient was admitted for symptomatic management.   Review of Systems  Constitutional: Positive for appetite change and fatigue. Negative for chills and fever.  HENT:   Negative for hearing loss and voice change.   Eyes: Negative for eye problems.  Respiratory: Negative for chest tightness, cough and shortness of breath.     Cardiovascular: Negative for chest pain.  Gastrointestinal: Positive for nausea and vomiting. Negative for abdominal distention, abdominal pain and blood in stool.  Endocrine: Negative for hot flashes.  Genitourinary: Negative for difficulty urinating and frequency.   Musculoskeletal: Negative for arthralgias.  Skin: Negative for itching and rash.  Neurological: Negative for extremity weakness.  Hematological: Negative for adenopathy.  Psychiatric/Behavioral: Negative for confusion.    Allergies  Allergen Reactions   Ketamine Anxiety and Other (See Comments)    Syncope episode/confusion     Patient Active Problem List   Diagnosis Date Noted   Abdominal pain 07/23/2019   Constipation 07/22/2019   Malnutrition of moderate degree 06/22/2019   Small bowel obstruction due to adhesions ([place]) 06/21/2019   Acute gastroenteritis 06/14/2019   Intractable nausea and vomiting 06/07/2019   Chest pain in adult 05/19/2019   Intractable pain 05/19/2019   Cancer associated pain    Proctitis, radiation    Palliative care encounter    Encounter for monitoring opioid maintenance therapy 11/19/2018   Adrenal insufficiency ([place]) 09/03/2018   Chronic diarrhea 06/25/2018   Chronic anticoagulation 06/25/2018   Vomiting    Encounter for antineoplastic chemotherapy 06/04/2018   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   Lung metastasis ([place]) 05/15/2018   Closed fracture of distal end of radius 05/04/2018   History of total knee arthroplasty 04/24/2018   Osteoarthritis of left knee 02/28/2018   Condyloma acuminatum of anus s/p ablation 02/22/2018 02/22/2018   Condyloma acuminatum of vagina s/p ablation 02/22/2018 02/22/2018   Positive ANA (antinuclear antibody) 02/04/2018   Aortic atherosclerosis ([place]) 01/15/2018   Elevated MCV 01/15/2018   Anemia 01/15/2018   Genital warts 01/15/2018   Vitamin D deficiency 01/15/2018   Pernicious anemia  Impingement syndrome of  shoulder region 12/19/2017   Multiple joint pain 12/19/2017   Osteoarthritis of right knee 12/19/2017   B12 deficiency 12/11/2017   Lung nodules 12/11/2017   History of Clostridium difficile infection 10/23/2017   Factor V Leiden ([place]) 10/19/2017   Goals of care, counseling/discussion 10/19/2017   Cervical cancer, FIGO stage IB1 ([place]) 09/18/2017   Chronic hepatitis C without hepatic coma ([place]) 09/18/2017   Cytopenia 09/18/2017   Diarrhea 09/18/2017   Lower abdominal pain 09/18/2017   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   Malignant neoplasm of overlapping sites of cervix ([place]) 09/18/2017   Renal insufficiency 09/18/2017   Wound infection after surgery 09/12/2017   Hypokalemia    Hypomagnesemia    Cervical arthritis 07/18/2017   Dysuria 06/20/2017   Metastatic cancer ([place]) 05/12/2017   Essential hypertension 03/15/2017   Anemia in other chronic diseases classified elsewhere 03/01/2017   Chemotherapy-induced neutropenia ([place]) 01/29/2017   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[place]) 09/25/2016     Past Medical History:  Diagnosis Date   Abdominal pain 06/10/2018   Abnormal cervical Papanicolaou smear 09/18/2017   Anxiety    Aortic atherosclerosis (HCC)    Arthritis    neck and knees   Blood clots in brain    both lungs and right kidney   Blood transfusion without reported diagnosis    Cervical cancer ([place]) 09/2016   mets lung   Chronic anal fissure    Chronic diarrhea    Dyspnea    Erosive gastropathy 09/18/2017   Factor V Leiden mutation ([place])    Fecal incontinence    Genital warts    GERD (gastroesophageal reflux disease)    GI bleed 10/08/2018   Heart murmur    Hematochezia    Hemorrhoids    Hepatitis C    Chronic, 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  Negative  test 11/1192   Ileocolic anastomotic leak    Infarction of kidney (HCC) left kidney   and uterus   Intestinal infection due to Clostridium difficile 09/18/2017   Macrocytic anemia with vitamin B12 deficiency    Multiple gastric ulcers    Nausea vomiting and diarrhea    Pancolitis ([place]) 07/27/2018   Perianal condylomata    Pneumonia    History of   Pulmonary nodules    Rectal bleeding    Small bowel obstruction ([place]) 08/2017   Stiff neck    limited right turn   Vitamin D deficiency      Past Surgical History:  Procedure Laterality Date   CHOLECYSTECTOMY     COLON SURGERY  08/2017   resection   COLONOSCOPY WITH PROPOFOL N/A 12/20/2017   Procedure: COLONOSCOPY WITH PROPOFOL;  Surgeon: [name], MD;  Location: [place];  Service: Gastroenterology;  Laterality: N/A;   COLONOSCOPY WITH PROPOFOL N/A 07/30/2018   Procedure: COLONOSCOPY WITH PROPOFOL;  Surgeon: [name], MD;  [facility] END[facility]COPY;  Service: Gastroenterology;  Laterality: N/A;   COLONOSCOPY WITH PROPOFOL N/A 10/10/2018   Procedure: COLONOSCOPY WITH PROPOFOL;  Surgeon: Lucilla Lame, MD;  Location: [facility] ENDOSCOPY;  Service: Endoscopy;  Laterality: N/A;   DIAGNOSTIC LAPAROSCOPY     ESOPHAGOGASTRODUODENOSCOPY (EGD) WITH PROPOFOL N/A 12/20/2017   Procedure: ESOPHAGOGASTRODUODENOSCOPY (EGD) WITH PROPOFOL;  Surgeon: [name], MD;  Location: [facility] ENDOSCOPY;  Service: Gastroenterology;  Laterality: N/A;  ESOPHAGOGASTRODUODENOSCOPY (EGD) WITH PROPOFOL  07/30/2018   Procedure: ESOPHAGOGASTRODUODENOSCOPY (EGD) WITH PROPOFOL;  Surgeon: [name], MD;  Location: [place];  Service: Gastroenterology;;   [place] N/A 11/21/2018   Procedure: FLEXIBLE SIGMOIDOSCOPY;  Surgeon: [name], MD;  Location[facility] END[facility]COPY;  Service: Gastroenterology;  Laterality: N/A;   LAPAROTOMY N/A 08/31/2017   Procedure: EXPLORATORY LAPAROTOMY for SBO,  ileocolectomy, removal of piece of uterine wall;  Surgeon: [name], MD;  Location: [facility] ORS;  Service: General;  Laterality: N/A;   LASER ABLATION CONDOLAMATA N/A 02/22/2018   Procedure: LASER ABLATION/REMOVAL OF ONGEXBMWUXL AROUND ANUS AND VAGINA;  Surgeon: Michael Boston, MD;  Location: [place];  Service: General;  Laterality: N/A;   OOPHORECTOMY     PORTA CATH INSERTION N/A 05/13/2018   Procedure: PORTA CATH INSERTION;  Surgeon: Katha Cabal, MD;  Location: [place] [facility];  Service: Cardiovascular;  Laterality: N/A;   SMALL INTESTINE SURGERY     TANDEM RING INSERTION     x3   THORACOTOMY     TOTAL KNEE ARTHROPLASTY Left 04/24/2018   Procedure: TOTAL KNEE ARTHROPLASTY;  Surgeon: Lovell Sheehan, MD;  Location: [facility] ORS;  Service: Orthopedics;  Laterality: Left;    Social History   Socioeconomic History   Marital status: Divorced    Spouse name: Not on file   Number of children: Not on file   Years of education: Not on file   Highest education level: Not on file  Occupational History   Not on file  Social Needs   Financial resource strain: Not hard at all   Food insecurity    Worry: Never true    Inability: Never true   Transportation needs    Medical: No    Non-medical: No  Tobacco Use   Smoking status: Former Smoker    Packs/day: 0.25    Years: 10.00    Pack years: 2.50    Types: Cigarettes    Quit date: 10/16/2006    Years since quitting: 12.8   Smokeless tobacco: Never Used  Substance and Sexual Activity   Alcohol use: Not Currently    Frequency: Never    Comment: seldom   Drug use: Yes    Types: Marijuana    Comment: not very often   Sexual activity: Not Currently    Birth control/protection: Post-menopausal    Comment: Not Asked  Lifestyle   Physical activity    Days per week: Patient refused    Minutes per session: Patient refused   Stress: Only a little  Relationships   Press photographer  on phone: Patient refused    Gets together: Patient refused    Attends religious service: Patient refused    Active member of club or organization: Patient refused    Attends meetings of clubs or organizations: Patient refused    Relationship status: Patient refused   Intimate partner violence    Fear of current or ex partner: No    Emotionally abused: No    Physically abused: No    Forced sexual activity: No  Other Topics Concern   Not on file  Social History Narrative   Not on file     Family History  Problem Relation Age of Onset   Hypertension Father    Diabetes Father    Alcohol abuse Daughter    Hypertension Maternal Grandmother    Diabetes Maternal Grandmother    Diabetes Paternal Grandmother    Hypertension Paternal  Grandmother      Current Facility-Administered Medications:    0.9 %  sodium chloride infusion, , Intravenous, Continuous, Demetrios Loll, MD, Last Rate: 100 mL/hr at 08/13/19 2031   acetaminophen (TYLENOL) tablet 650 mg, 650 mg, Oral, Q6H PRN **OR** acetaminophen (TYLENOL) suppository 650 mg, 650 mg, Rectal, Q6H PRN, Demetrios Loll, MD   albuterol (PROVENTIL) (2.5 MG/3ML) 0.083% nebulizer solution 2.5 mg, 2.5 mg, Nebulization, Q2H PRN, Demetrios Loll, MD   albuterol (PROVENTIL) (2.5 MG/3ML) 0.083% nebulizer solution 2.5 mg, 2.5 mg, Nebulization, Q6H PRN, Brantley Stage, Walid A, RPH   amitriptyline (ELAVIL) tablet 75 mg, 75 mg, Oral, QHS, Demetrios Loll, MD, 75 mg at 08/13/19 2206   bisacodyl (DULCOLAX) EC tablet 5 mg, 5 mg, Oral, Daily PRN, Demetrios Loll, MD   calcium-vitamin D (OSCAL WITH D) 500-200 MG-UNIT per tablet 2 tablet, 2 tablet, Oral, Daily, Demetrios Loll, MD, 2 tablet at 08/13/19 1805   colchicine tablet 0.6 mg, 0.6 mg, Oral, BID, Demetrios Loll, MD, 0.6 mg at 08/13/19 1550   famotidine (PEPCID) tablet 20 mg, 20 mg, Oral, BID PRN, Demetrios Loll, MD   folic acid (FOLVITE) tablet 1 mg, 1 mg, Oral, Daily, Demetrios Loll, MD, 1 mg at 08/13/19 1550   hydrocortisone  (CORTEF) tablet 20 mg, 20 mg, Oral, Daily, Demetrios Loll, MD, 20 mg at 08/13/19 1805   HYDROmorphone (DILAUDID) injection 1 mg, 1 mg, Intravenous, Q4H PRN, Demetrios Loll, MD, 1 mg at 08/13/19 2032   hydrOXYzine (ATARAX/VISTARIL) tablet 10 mg, 10 mg, Oral, TID PRN, Demetrios Loll, MD   hyoscyamine (LEVBID) 0.375 MG 12 hr tablet 0.375 mg, 0.375 mg, Oral, Q12H, Demetrios Loll, MD, 0.375 mg at 08/13/19 1804   ondansetron (ZOFRAN) tablet 4 mg, 4 mg, Oral, Q6H PRN **OR** ondansetron (ZOFRAN) injection 4 mg, 4 mg, Intravenous, Q6H PRN, Demetrios Loll, MD, 4 mg at 08/13/19 1534   oxyCODONE (Oxy IR/ROXICODONE) immediate release tablet 10 mg, 10 mg, Oral, Q6H PRN, Demetrios Loll, MD, 10 mg at 08/13/19 1536   oxyCODONE (OXYCONTIN) 12 hr tablet 15 mg, 15 mg, Oral, Q12H, Demetrios Loll, MD, 15 mg at 08/13/19 2206   pantoprazole (PROTONIX) EC tablet 40 mg, 40 mg, Oral, Daily, Demetrios Loll, MD, 40 mg at 08/13/19 1549   potassium chloride SA (KLOR-CON) CR tablet 20 mEq, 20 mEq, Oral, Daily PRN, Demetrios Loll, MD   rivaroxaban Alveda Reasons) tablet 20 mg, 20 mg, Oral, Q supper, Demetrios Loll, MD, 20 mg at 08/13/19 1805   senna-docusate (Senokot-S) tablet 1 tablet, 1 tablet, Oral, QHS PRN, Demetrios Loll, MD   Tbo-Filgrastim [facility]) injection 480 mcg, 480 mcg, Subcutaneous, Daily, Earlie Server, MD  Facility-Administered Medications Ordered in Other Encounters:    heparin lock flush 100 unit/mL, 500 Units, Intravenous, Once, [place], Melissa C, MD   sodium chloride 0.9 % 1,000 mL with potassium chloride 20 mEq infusion, , Intravenous, Once, Honor Loh E, NP   sodium chloride flush (NS) 0.9 % injection 10 mL, 10 mL, Intravenous, Once, Borders, Kirt Boys, NP   Physical exam:  Vitals:   08/13/19 1804 08/13/19 1808 08/13/19 1857 08/13/19 2010  BP:  116/86 137/84 (!) 150/113  Pulse: 87  77 97  Resp: 19 15 19 18   Temp:   98.1 F (36.7 C) 99.5 F (37.5 C)  TempSrc:    Oral  SpO2: 98%  100% 100%  Weight:      Height:       Physical Exam    Constitutional: She is oriented to person, place, and time. No  distress.  Chronic ill appearance.  HENT:  Head: Normocephalic.  Mouth/Throat: No oropharyngeal exudate.  Eyes: Pupils are equal, round, and reactive to light. EOM are normal. No scleral icterus.  Neck: Normal range of motion. Neck supple.  Cardiovascular: Normal rate and regular rhythm.  No murmur heard. Pulmonary/Chest: Effort normal and breath sounds normal. No respiratory distress.  Abdominal: Soft. Bowel sounds are normal.  Diffuse tender with palpation.  Musculoskeletal: Normal range of motion.        General: No edema.  Neurological: She is alert and oriented to person, place, and time. No cranial nerve deficit.  Skin: Skin is warm and dry. She is not diaphoretic.  Psychiatric: Affect normal.        CMP Latest Ref Rng & Units 08/13/2019  Glucose 70 - 99 mg/dL 113(H)  BUN 6 - 20 mg/dL 14  Creatinine 0.44 - 1.00 mg/dL 0.77  Sodium 135 - 145 mmol/L 135  Potassium 3.5 - 5.1 mmol/L 3.5  Chloride 98 - 111 mmol/L 100  CO2 22 - 32 mmol/L 22  Calcium 8.9 - 10.3 mg/dL 9.0  Total Protein 6.5 - 8.1 g/dL 7.1  Total Bilirubin 0.3 - 1.2 mg/dL 1.2  Alkaline Phos 38 - 126 U/L 154(H)  AST 15 - 41 U/L 69(H)  ALT 0 - 44 U/L 144(H)   CBC Latest Ref Rng & Units 08/13/2019  WBC 4.0 - 10.5 K/uL 1.0(LL)  Hemoglobin 12.0 - 15.0 g/dL 14.2  Hematocrit 36.0 - 46.0 % 42.4  Platelets 150 - 400 K/uL 116(L)    @IMAGES @  Abd 1 View (kub)  Result Date: 07/23/2019 CLINICAL DATA:  Constipation EXAM: ABDOMEN - 1 VIEW COMPARISON:  Abdominal CT from yesterday FINDINGS: Formed stool throughout the descending and sigmoid colon. Mild stool in the proximal colon. There has been modest evacuation since yesterday. No obstructive bowel dilatation. Right upper quadrant clips. Lumbar spine degeneration with levoscoliosis. Cavitary pulmonary nodules. IMPRESSION: Improving stool burden when compared to CT yesterday. Electronically Signed   By:  Monte Fantasia M.D.   On: 07/23/2019 07:22   Ct Chest Wo Contrast  Result Date: 08/01/2019 CLINICAL DATA:  Cervical cancer with lung metastases. EXAM: CT CHEST WITHOUT CONTRAST TECHNIQUE: Multidetector CT imaging of the chest was performed following the standard protocol without IV contrast. COMPARISON:  Abdomen pelvis CT 07/22/2019. Chest CT 05/18/2019. FINDINGS: Cardiovascular: The heart size is normal. No substantial pericardial effusion. Atherosclerotic calcification is noted in the wall of the thoracic aorta. Right Port-A-Cath tip is positioned at the SVC/RA junction. Mediastinum/Nodes: No mediastinal lymphadenopathy. No evidence for gross hilar lymphadenopathy although assessment is limited by the lack of intravenous contrast on today's study. The esophagus has normal imaging features. There is no axillary lymphadenopathy. Lungs/Pleura: Innumerable bilateral pulmonary nodules are evident, many of which are cavitary. Index right upper lobe nodule on 59/3 measures 1.2 cm today increased from 0.9 cm when I remeasure in a similar fashion on the prior study. Measuring another index nodule in the left lower lobe (63/3) this nodule is 1.4 cm today compared to 1.2 cm when remeasured on the prior study. A third nodule in the right lung base (96/3) is 1.7 cm today compared to 1.5 cm when remeasured similarly on the prior study. No pleural effusion. Upper Abdomen: Unremarkable. Musculoskeletal: No worrisome lytic or sclerotic osseous abnormality. IMPRESSION: 1. Interval progression of innumerable bilateral pulmonary nodules, many of which are cavitary. Imaging features are compatible with progression of metastatic disease. 2.  Aortic Atherosclerois (ICD10-170.0) Electronically Signed  By: Misty Stanley M.D.   On: 08/01/2019 14:09   Ct Abdomen Pelvis W Contrast  Result Date: 08/13/2019 CLINICAL DATA:  Abdominal pain EXAM: CT ABDOMEN AND PELVIS WITH CONTRAST TECHNIQUE: Multidetector CT imaging of the abdomen  and pelvis was performed using the standard protocol following bolus administration of intravenous contrast. CONTRAST:  180m OMNIPAQUE IOHEXOL 300 MG/ML  SOLN COMPARISON:  07/22/2019 FINDINGS: Lower chest: Numerous bibasilar cavitary nodules are again seen and are similar prior study. The largest in the right lower lobe measures 16 mm, stable. Hepatobiliary: Prior cholecystectomy. Mild intrahepatic and extrahepatic biliary ductal dilatation. Common bile duct measures up to 13 mm, stable. This is likely related to post cholecystectomy state. No focal hepatic abnormality. Pancreas: No focal abnormality or ductal dilatation. Spleen: No focal abnormality.  Normal size. Adrenals/Urinary Tract: No adrenal abnormality. No focal renal abnormality. No stones or hydronephrosis. Urinary bladder is unremarkable. Stomach/Bowel: Stomach, large and small bowel grossly unremarkable. Vascular/Lymphatic: Aortic atherosclerosis. No enlarged abdominal or pelvic lymph nodes. Reproductive: Uterus and adnexa unremarkable.  No mass. Other: No free fluid or free air. Musculoskeletal: No acute bony abnormality. IMPRESSION: Numerous bilateral lower lobe cavitary nodules, stable since prior study. Stable intrahepatic and extrahepatic biliary ductal dilatation, likely related to post cholecystectomy state. Aortic atherosclerosis. No acute findings in the abdomen or pelvis. Electronically Signed   By: KRolm BaptiseM.D.   On: 08/13/2019 11:07   Ct Abdomen Pelvis W Contrast  Result Date: 07/22/2019 CLINICAL DATA:  Constipation. No bowel movement in 3 days. History of bowel obstructions and bowel resection. EXAM: CT ABDOMEN AND PELVIS WITH CONTRAST TECHNIQUE: Multidetector CT imaging of the abdomen and pelvis was performed using the standard protocol following bolus administration of intravenous contrast. CONTRAST:  1078mOMNIPAQUE IOHEXOL 300 MG/ML  SOLN COMPARISON:  06/21/2019 FINDINGS: Lower chest: Multiple lung base nodules, many cavitary.  Mild overall increase in the size of pulmonary nodules. There is a cavitary nodule in the posteromedial right lower lobe, image 1, series 4, 12 mm, previously 9 mm. Left lower lobe nodule, image 4, series 4, 11 mm, previously 9.5 mm. Hepatobiliary: Liver normal in size and attenuation. No mass there is intrahepatic bile duct dilation similar to the prior study. Common bile duct is dilated to a maximum of 1.3 cm. It tapers distally. This is also stable. Status post cholecystectomy. Pancreas: Unremarkable. No pancreatic ductal dilatation or surrounding inflammatory changes. Spleen: Normal in size without focal abnormality. Adrenals/Urinary Tract: No adrenal masses. Kidneys normal in size, orientation and position. Symmetric renal enhancement and excretion. Tiny bilateral nonobstructing intrarenal stones. No renal masses. No hydronephrosis. Ureters normal in course and in caliber. Bladder is unremarkable. Stomach/Bowel: Status post partial right hemicolectomy. No evidence of bowel obstruction. No bowel wall thickening or inflammation. Moderate increased stool burden noted throughout the colon. Stomach is unremarkable. Vascular/Lymphatic: Aortic atherosclerosis. No enlarged abdominal or pelvic lymph nodes. Reproductive: Questionable residual uterine tissue above the vaginal cuff measuring 3.3 x 3.1 x 3.5 cm. History reports cervical cancer with metastatic disease to the lung. It is unclear whether there has been a hysterectomy. This appearance is stable from the prior CT. No adnexal masses. Other: No abdominal wall hernia or abnormality. No abdominopelvic ascites. Musculoskeletal: No fracture or acute finding. No osteoblastic or osteolytic lesions. IMPRESSION: 1. No acute findings within the abdomen or pelvis. No evidence of bowel obstruction. 2. Moderate constipation with increased stool burden throughout the colon. Stable changes from a partial right hemicolectomy. 3. Multiple lung base nodules, many cavitary,  with  some progression from the prior CT, several nodules having increased in size. 4. Soft tissue above the vaginal cuff, which may reflect residual uterine tissue. This is stable on multiple prior CTs. 5. Aortic atherosclerosis. 6. Chronic intra and extrahepatic bile duct dilation. Status post cholecystectomy. 7. Tiny nonobstructing intrarenal stones. Electronically Signed   By: Lajean Manes M.D.   On: 07/22/2019 11:40   Dg Fluoro Guide Cv Line Right  Result Date: 08/05/2019 INDICATION: No blood return from port. EXAM: Port-A-Cath check. MEDICATIONS: None.; The antibiotic was administered within an appropriate time interval prior to skin puncture. ANESTHESIA/SEDATION: None. FLUOROSCOPY TIME:  Fluoroscopy Time: 0 minutes minutes 24 seconds (at 10 mGy). COMPLICATIONS: None. PROCEDURE: Through the existing Port-A-Cath access approximately 10 cc of Omnipaque 300 was administered. Frontal and oblique views obtained. Port-A-Cath is in widely patent. No evidence of fracture or displacement. No collagen sheath noted. IMPRESSION: Normal exam.  Widely patent Port-A-Cath. Electronically Signed   By: Marcello Moores  Register   On: 08/05/2019 12:14    Assessment and plan- Patient is a 53 y.o. female with history of cervical cancer/lung metastasis, chronic diarrhea, history of small bowel obstruction and other multiple medical problems presented emergency room due to abdominal pain, nausea vomiting. CT did not show acute findings in the abdomen.    #Neutropenia, most recent chemotherapy Alimta 1 week ago.  Likely chemotherapy-induced. ANC 0.4.  I will start patient with growth factors with Granix.  Rationale and potential side effects discussed with patient.  She agrees with the plan.  #Nausea and vomiting, agree with supportive care.  IV fluid, antiemetics. #Acute on chronic abdominal pain, likely neoplasm related.  Continue pain regimen with oxycodone 10 mg every 6 hours..  OxyContin 15 mg every 12 hours.  #Transaminitis  likely secondary to recent chemotherapy.  Continue to monitor. #History of thrombosis, continue Xarelto. #Adrenal insufficiency, hydrocortisone 20 mg daily.  I would convert to IV and also give her stress dose Solu-Cortef 60 mg for 2 to 3 days.  Patient may go back to baseline dose of hydrocortisone 20 mg after symptoms improves  #Cervical cancer with lung metastasis.  Poor prognosis.  Patient has very poor tolerance for chemotherapy due to multiple medical problems.  Continue supportive care.  Goal of care was discussed with patient.  Palliative care consultation if no improvement. .   Thank you for allowing me to participate in the care of this patient.  Total face to face encounter time for this patient visit was 70 min. >50% of the time was  spent in counseling and coordination of care.    Earlie Server, MD, PhD Hematology Oncology [facility] Pager- [phone] 08/13/2019

## 2019-08-13 NOTE — ED Provider Notes (Signed)
[facility] Emergency Department Provider Note  ____________________________________________  Time seen: Approximately 9:00 AM  I have reviewed the triage vital signs and the nursing notes.   HISTORY  Chief Complaint Abdominal Pain and Nausea    HPI Joanna Hall is a 53 y.o. female with a history of metastatic cervical cancer and chronic abdominal pain who comes the ED complaining of severe lower abdominal pain radiating to the back, constant, waxing and waning, ongoing for the last 2 days.  Associated nausea vomiting and diarrhea.  Reports that she recently reinitiated chemotherapy after immunotherapy was unsuccessful, last had chemotherapy 6 days ago.  Denies fever chills body aches.  No shortness of breath or cough.  No significant chest pain.   Received 500 mL saline and 4 mg IV Zofran by EMS prior to arrival.  Past Medical History:  Diagnosis Date  . Abdominal pain 06/10/2018  . Abnormal cervical Papanicolaou smear 09/18/2017  . Anxiety   . Aortic atherosclerosis ([place])   . Arthritis    neck and knees  . 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 Blood transfusion without reported diagnosis   . Cervical cancer ([place]) 09/2016   mets lung  . Chronic anal fissure   . Chronic diarrhea   . Dyspnea   . Erosive gastropathy 09/18/2017  . Factor V Leiden mutation ([place])   . Fecal incontinence   . Genital warts   . GERD (gastroesophageal reflux disease)   . GI bleed 10/08/2018  . Heart murmur   . Hematochezia   . Hemorrhoids   . 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 Hepatitis, chronic ([place]) 05/05/2017  . History of cancer chemotherapy    completed 06/2017  . History of Clostridium difficile infection    while undergoing chemo.  Negative test 10/2017  . Ileocolic anastomotic leak   . Infarction of kidney ([place]) left kidney   and uterus  . Intestinal infection due to Clostridium difficile 09/18/2017  .  Macrocytic anemia with vitamin B12 deficiency   . Multiple gastric ulcers   . Nausea vomiting and diarrhea   . Pancolitis ([place]) 07/27/2018  . Perianal condylomata   . Pneumonia    History of  . Pulmonary nodules   . Rectal bleeding   . Small bowel obstruction ([place]) 08/2017  . Stiff neck    limited right turn  . Vitamin D deficiency      Patient Active Problem List   Diagnosis Date Noted  . Abdominal pain 07/23/2019  . Constipation 07/22/2019  . Malnutrition of moderate degree 06/22/2019  . Small bowel obstruction due to adhesions ([place]) 06/21/2019  . Acute gastroenteritis 06/14/2019  . Intractable nausea and vomiting 06/07/2019  . Chest pain in adult 05/19/2019  . Intractable pain 05/19/2019  . Cancer associated pain   . Proctitis, radiation   . Palliative care encounter   . Encounter for monitoring opioid maintenance therapy 11/19/2018  . Adrenal insufficiency ([place]) 09/03/2018  . Chronic diarrhea 06/25/2018  . Chronic anticoagulation 06/25/2018  . Vomiting   . Encounter for antineoplastic chemotherapy 06/04/2018  . 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 Lung metastasis ([place]) 05/15/2018  . Closed fracture of distal end of radius 05/04/2018  . History of total knee arthroplasty 04/24/2018  . Osteoarthritis of left knee 02/28/2018  . Condyloma acuminatum of anus s/p ablation 02/22/2018 02/22/2018  . Condyloma acuminatum of vagina s/p ablation 02/22/2018 02/22/2018  .  Positive ANA (antinuclear antibody) 02/04/2018  . Aortic atherosclerosis ([place]) 01/15/2018  . Elevated MCV 01/15/2018  . Anemia 01/15/2018  . Genital warts 01/15/2018  . Vitamin D deficiency 01/15/2018  . Pernicious anemia   . Impingement syndrome of shoulder region 12/19/2017  . Multiple joint pain 12/19/2017  . Osteoarthritis of right knee 12/19/2017  . B12 deficiency 12/11/2017  . Lung nodules 12/11/2017  . History of Clostridium difficile infection 10/23/2017  . Factor V Leiden ([place]) 10/19/2017  .  Goals of care, counseling/discussion 10/19/2017  . Cervical cancer, FIGO stage IB1 ([place]) 09/18/2017  . Chronic hepatitis C without hepatic coma ([place]) 09/18/2017  . Cytopenia 09/18/2017  . Diarrhea 09/18/2017  . Lower abdominal pain 09/18/2017  . 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 Malignant neoplasm of overlapping sites of cervix ([place]) 09/18/2017  . Renal insufficiency 09/18/2017  . Wound infection after surgery 09/12/2017  . Hypokalemia   . Hypomagnesemia   . Cervical arthritis 07/18/2017  . Dysuria 06/20/2017  . Metastatic cancer ([place]) 05/12/2017  . Essential hypertension 03/15/2017  . Anemia in other chronic diseases classified elsewhere 03/01/2017  . Chemotherapy-induced neutropenia ([place]) 01/29/2017  . 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[place]) 09/25/2016     Past Surgical History:  Procedure Laterality Date  . CHOLECYSTECTOMY    . COLON SURGERY  08/2017   resection  . COLONOSCOPY WITH PROPOFOL N/A 12/20/2017   Procedure: COLONOSCOPY WITH PROPOFOL;  Surgeon: [name], MD;  Location[facility] ENDOSCOPY;  Service: Gastroenterology;  Laterality: N/A;  . COLONOSCOPY WITH PROPOFOL N/A 07/30/2018   Procedure: COLONOSCOPY WITH PROPOFOL;  Surgeon: [name], MD;  Location[facility] ENDOSCOPY;  Service: Gastroenterology;  Laterality: N/A;  . COLONOSCOPY WITH PROPOFOL N/A 10/10/2018   Procedure: COLONOSCOPY WITH PROPOFOL;  Surgeon: Lucilla Lame, MD;  Location: [facility] ENDOSCOPY;  Service: Endoscopy;  Laterality: N/A;  . DIAGNOSTIC LAPAROSCOPY    . ESOPHAGOGASTRODUODENOSCOPY (EGD) WITH PROPOFOL N/A 12/20/2017   Procedure: ESOPHAGOGASTRODUODENOSCOPY (EGD) WITH PROPOFOL;  Surgeon: [name], MD;  Location: [place];  Service: Gastroenterology;  Laterality: N/A;  . ESOPHAGOGASTRODUODENOSCOPY (EGD) WITH PROPOFOL  07/30/2018   Procedure: ESOPHAGOGASTRODUODENOSCOPY (EGD) WITH PROPOFOL;  Surgeon: [name], MD;  Location: [facility] ENDOSCOPY;  Service: Gastroenterology;;   . Otho Darner SIGMOIDOSCOPY N/A 11/21/2018   Procedure: FLEXIBLE SIGMOIDOSCOPY;  Surgeon: [name], MD;  Location: [facility] ENDOSCOPY;  Service: Gastroenterology;  Laterality: N/A;  . LAPAROTOMY N/A 08/31/2017   Procedure: EXPLORATORY LAPAROTOMY for SBO, ileocolectomy, removal of piece of uterine wall;  Surgeon: [name], MD;  Location: [facility] ORS;  Service: General;  Laterality: N/A;  . LASER ABLATION CONDOLAMATA N/A 02/22/2018   Procedure: LASER ABLATION/REMOVAL OF CLEXNTZGYFV AROUND ANUS AND VAGINA;  Surgeon: Michael Boston, MD;  Location: [place];  Service: General;  Laterality: N/A;  . OOPHORECTOMY    . PORTA CATH INSERTION N/A 05/13/2018   Procedure: PORTA CATH INSERTION;  Surgeon: Katha Cabal, MD;  Location: [place] CV LAB;  Service: Cardiovascular;  Laterality: N/A;  . SMALL INTESTINE SURGERY    . TANDEM RING INSERTION     x3  . THORACOTOMY    . TOTAL KNEE ARTHROPLASTY Left 04/24/2018   Procedure: TOTAL KNEE ARTHROPLASTY;  Surgeon: Lovell Sheehan, MD;  Location: [facility] ORS;  Service: Orthopedics;  Laterality: Left;     Prior to Admission medications   Medication Sig Start Date End Date Taking? Authorizing Provider  amitriptyline (ELAVIL) 75 MG tablet Take 1 tablet (75 mg total) by mouth at bedtime. 05/06/19 08/13/19  Yes Vanga, Tally Due, MD  Calcium Carb-Cholecalciferol (CALCIUM 500 +D) 500-400 MG-UNIT TABS Take 2 tablets by mouth daily.   Yes [provider]  colchicine 0.6 MG tablet Take 0.6 mg by mouth 2 (two) times daily. 06/19/19  Yes [provider]  dexamethasone (DECADRON) 4 MG tablet Take one pill AM & PM x 3 days; start the day prior to chemo. 08/01/19  Yes Cammie Sickle, MD  diphenoxylate-atropine (LOMOTIL) 2.5-0.025 MG tablet TAKE (2) TABLETS FOUR TIMES DAILY. 04/21/19  Yes Vanga, Tally Due, MD  famotidine (PEPCID) 20 MG tablet Take 1 tablet (20 mg total) by mouth 2 (two) times daily for 5 days. Patient taking  differently: Take 20 mg by mouth 2 (two) times daily as needed.  05/23/19 08/13/19 Yes Duffy Bruce, MD  folic acid (FOLVITE) 1 MG tablet Take 1 tablet (1 mg total) by mouth daily. 07/31/19  Yes Cammie Sickle, MD  hydrocortisone (CORTEF) 20 MG tablet Take 1 tablet (20 mg total) by mouth daily. 06/10/19  Yes Gladstone Lighter, MD  hydrOXYzine (ATARAX/VISTARIL) 10 MG tablet Take 1 tablet (10 mg total) by mouth 3 (three) times daily as needed. 06/13/19  Yes Cammie Sickle, MD  hyoscyamine (LEVBID) 0.375 MG 12 hr tablet Take 1 tablet (0.375 mg total) by mouth 2 (two) times daily. 08/05/19  Yes Vanga, Tally Due, MD  ondansetron (ZOFRAN ODT) 4 MG disintegrating tablet Take 1 tablet (4 mg total) by mouth every 8 (eight) hours as needed for nausea or vomiting. 07/08/19  Yes Cammie Sickle, MD  oxyCODONE (OXYCONTIN) 15 mg 12 hr tablet Take 1 tablet (15 mg total) by mouth every 12 (twelve) hours. 08/01/19  Yes Verlon Au, NP  Oxycodone HCl 10 MG TABS Take 1 tablet (10 mg total) by mouth every 6 (six) hours as needed (for breakthrough pain). 08/04/19  Yes Verlon Au, NP  pantoprazole (PROTONIX) 40 MG tablet Take 1 tablet (40 mg total) by mouth daily. 03/18/19  Yes Cammie Sickle, MD  potassium chloride SA (K-DUR) 20 MEQ tablet Take 1 tablet (20 mEq total) by mouth daily as needed (if having nausea/vomiting- to supplements for lost potassium). 06/10/19  Yes Gladstone Lighter, MD  promethazine (PHENERGAN) 25 MG tablet Take 1 tablet (25 mg total) by mouth every 8 (eight) hours as needed for nausea or vomiting. 07/07/19  Yes Cammie Sickle, MD  rivaroxaban (XARELTO) 20 MG TABS tablet Take 1 tablet (20 mg total) by mouth daily with supper. Patient taking differently: Take 20 mg by mouth daily with supper. Takes in the morning 05/05/19  Yes Cammie Sickle, MD  traMADol (ULTRAM) 50 MG tablet Take 50 mg by mouth every 6 (six) hours as needed.   Yes [provider]  VENTOLIN HFA 108 (90 Base) MCG/ACT inhaler Inhale 1-2 puffs into the lungs every 4 (four) hours as needed for shortness of breath. 02/19/19  Yes Verlon Au, NP     Allergies Ketamine   Family History  Problem Relation Age of Onset  . Hypertension Father   . Diabetes Father   . Alcohol abuse Daughter   . Hypertension Maternal Grandmother   . Diabetes Maternal Grandmother   . Diabetes Paternal Grandmother   . Hypertension Paternal Grandmother     Social History Social History   Tobacco Use  . Smoking status: Former Smoker    Packs/day: 0.25    Years: 10.00    Pack years: 2.50    Types: Cigarettes  Quit date: 10/16/2006    Years since quitting: 12.8  . Smokeless tobacco: Never Used  Substance Use Topics  . Alcohol use: Not Currently    Frequency: Never    Comment: seldom  . Drug use: Yes    Types: Marijuana    Comment: not very often    Review of Systems  Constitutional:   No fever or chills.  ENT:   No sore throat. No rhinorrhea. Cardiovascular:   No chest pain or syncope. Respiratory:   No dyspnea or cough. Gastrointestinal:   Positive as above for abdominal pain, vomiting and diarrhea.  Musculoskeletal:   Negative for focal pain or swelling All other systems reviewed and are negative except as documented above in ROS and HPI.  ____________________________________________   PHYSICAL EXAM:  VITAL SIGNS: ED Triage Vitals  Enc Vitals Group     BP 08/13/19 0845 (!) 143/104     Pulse Rate 08/13/19 0845 91     Resp 08/13/19 0845 (!) 29     Temp 08/13/19 0845 98 F (36.7 C)     Temp Source 08/13/19 0845 Oral     SpO2 08/13/19 0845 100 %     Weight 08/13/19 0846 164 lb (74.4 kg)     Height 08/13/19 0846 5' 7"  (1.702 m)     Head Circumference --      Peak Flow --      Pain Score 08/13/19 0846 8     Pain Loc --      Pain Edu? --      Excl. in [place]? --     Vital signs reviewed, nursing assessments reviewed.   Constitutional:   Alert and  oriented. Non-toxic appearance.  Very uncomfortable Eyes:   Conjunctivae are normal. EOMI. PERRL. ENT      Head:   Normocephalic and atraumatic.      Nose:   Wearing a mask.      Mouth/Throat:   Wearing a mask.      Neck:   No meningismus. Full ROM. Hematological/Lymphatic/Immunilogical:   No cervical lymphadenopathy. Cardiovascular:   RRR. Symmetric bilateral radial and DP pulses.  No murmurs. Cap refill less than 2 seconds. Respiratory:   Normal respiratory effort without tachypnea/retractions. Breath sounds are clear and equal bilaterally. No wheezes/rales/rhonchi. Gastrointestinal:   Soft with diffuse lower abdominal tenderness.  Non distended. There is no CVA tenderness.  No rebound, rigidity, or guarding. Musculoskeletal:   Normal range of motion in all extremities. No joint effusions.  No lower extremity tenderness.  No edema. Neurologic:   Normal speech and language.  Motor grossly intact. No acute focal neurologic deficits are appreciated.  Skin:    Skin is warm, dry and intact. No rash noted.  No petechiae, purpura, or bullae.  ____________________________________________    LABS (pertinent positives/negatives) (all labs ordered are listed, but only abnormal results are displayed) Labs Reviewed  COMPREHENSIVE METABOLIC PANEL - Abnormal; Notable for the following components:      Result Value   Glucose, Bld 113 (*)    AST 69 (*)    ALT 144 (*)    Alkaline Phosphatase 154 (*)    All other components within normal limits  CBC WITH DIFFERENTIAL/PLATELET - Abnormal; Notable for the following components:   WBC 1.0 (*)    Platelets 116 (*)    Neutro Abs 0.4 (*)    Lymphs Abs 0.6 (*)    Monocytes Absolute 0.0 (*)    All other components within normal limits  MAGNESIUM -  Abnormal; Notable for the following components:   Magnesium 1.6 (*)    All other components within normal limits  URINE CULTURE  SARS CORONAVIRUS 2 (TAT 6-24 HRS)  LIPASE, BLOOD  PROTIME-INR  PHOSPHORUS   URIC ACID  PATHOLOGIST SMEAR REVIEW  URINALYSIS, COMPLETE (UACMP) WITH MICROSCOPIC   ____________________________________________   EKG    ____________________________________________    RADIOLOGY  Ct Abdomen Pelvis W Contrast  Result Date: 08/13/2019 CLINICAL DATA:  Abdominal pain EXAM: CT ABDOMEN AND PELVIS WITH CONTRAST TECHNIQUE: Multidetector CT imaging of the abdomen and pelvis was performed using the standard protocol following bolus administration of intravenous contrast. CONTRAST:  144m OMNIPAQUE IOHEXOL 300 MG/ML  SOLN COMPARISON:  07/22/2019 FINDINGS: Lower chest: Numerous bibasilar cavitary nodules are again seen and are similar prior study. The largest in the right lower lobe measures 16 mm, stable. Hepatobiliary: Prior cholecystectomy. Mild intrahepatic and extrahepatic biliary ductal dilatation. Common bile duct measures up to 13 mm, stable. This is likely related to post cholecystectomy state. No focal hepatic abnormality. Pancreas: No focal abnormality or ductal dilatation. Spleen: No focal abnormality.  Normal size. Adrenals/Urinary Tract: No adrenal abnormality. No focal renal abnormality. No stones or hydronephrosis. Urinary bladder is unremarkable. Stomach/Bowel: Stomach, large and small bowel grossly unremarkable. Vascular/Lymphatic: Aortic atherosclerosis. No enlarged abdominal or pelvic lymph nodes. Reproductive: Uterus and adnexa unremarkable.  No mass. Other: No free fluid or free air. Musculoskeletal: No acute bony abnormality. IMPRESSION: Numerous bilateral lower lobe cavitary nodules, stable since prior study. Stable intrahepatic and extrahepatic biliary ductal dilatation, likely related to post cholecystectomy state. Aortic atherosclerosis. No acute findings in the abdomen or pelvis. Electronically Signed   By: KRolm BaptiseM.D.   On: 08/13/2019 11:07     ____________________________________________   PROCEDURES Procedures  ____________________________________________  DIFFERENTIAL DIAGNOSIS   Tumor lysis, colitis, bowel obstruction, perforation, intra-abdominal abscess, cystitis, pyelonephritis, kidney stone  CLINICAL IMPRESSION / ASSESSMENT AND PLAN / ED COURSE  Medications ordered in the ED: Medications  sodium chloride 0.9 % bolus 1,000 mL (1,000 mLs Intravenous New Bag/Given 08/13/19 0857)  HYDROmorphone (DILAUDID) injection 1 mg (1 mg Intravenous Given 08/13/19 0858)  promethazine (PHENERGAN) injection 25 mg (25 mg Intravenous Given 08/13/19 0900)  HYDROmorphone (DILAUDID) injection 1 mg (1 mg Intravenous Given 08/13/19 1009)  iohexol (OMNIPAQUE) 300 MG/ML solution 100 mL (100 mLs Intravenous Contrast Given 08/13/19 1053)  metoCLOPramide (REGLAN) injection 10 mg (10 mg Intravenous Given 08/13/19 1226)  diphenhydrAMINE (BENADRYL) injection 25 mg (25 mg Intravenous Given 08/13/19 1223)  HYDROmorphone (DILAUDID) injection 1 mg (1 mg Intravenous Given 08/13/19 1231)    Pertinent labs & imaging results that were available during my care of the patient were reviewed by me and considered in my medical decision making (see chart for details).  KKRISTLE WESCHwas evaluated in Emergency Department on 08/13/2019 for the symptoms described in the history of present illness. She was evaluated in the context of the global COVID-19 pandemic, which necessitated consideration that the patient might be at risk for infection with the SARS-CoV-2 virus that causes COVID-19. Institutional protocols and algorithms that pertain to the evaluation of patients at risk for COVID-19 are in a state of rapid change based on information released by regulatory bodies including the CDC and federal and state organizations. These policies and algorithms were followed during the patient's care in the ED.   Patient presents with severe abdominal pain.   Tachypneic which I think is due to the severe pain, otherwise vital signs are okay, hemodynamically  stable.  With severity of pain and medical history, will need labs, CT scan of the abdomen and pelvis, IV fluids 1 L bolus, Dilaudid 1 mg IV for pain control, Phenergan 25 mg IV for nausea.   ----------------------------------------- 1:03 PM on 08/13/2019 -----------------------------------------  Labs showed thrombocytopenia and neutropenia.  Also elevation in transaminases likely all secondary to chemotherapy 1 week ago.  CT scan of the abdomen and pelvis is unremarkable.  However, despite IV fluids and multiple doses of IV Dilaudid and antiemetics, patient is still severely symptomatic.  Patient does not feel like her current condition is manageable at home.  I discussed with the hospitalist for further management.     ____________________________________________   FINAL CLINICAL IMPRESSION(S) / ED DIAGNOSES    Final diagnoses:  Intractable lower abdominal pain  Chemotherapy-induced neutropenia ([place])  Malignant neoplasm metastatic to lung, unspecified laterality [facility])     ED Discharge Orders    None      Portions of this note were generated with dragon dictation software. Dictation errors may occur despite best attempts at proofreading.   Carrie Mew, MD 08/13/19 1304

## 2019-08-13 NOTE — ED Notes (Signed)
attempted to call report, unable to give report at this time

## 2019-08-14 ENCOUNTER — Inpatient Hospital Stay: Payer: Medicaid Other

## 2019-08-14 DIAGNOSIS — R109 Unspecified abdominal pain: Secondary | ICD-10-CM | POA: Diagnosis not present

## 2019-08-14 DIAGNOSIS — Z515 Encounter for palliative care: Secondary | ICD-10-CM | POA: Diagnosis not present

## 2019-08-14 DIAGNOSIS — D701 Agranulocytosis secondary to cancer chemotherapy: Secondary | ICD-10-CM | POA: Diagnosis not present

## 2019-08-14 DIAGNOSIS — R1030 Lower abdominal pain, unspecified: Secondary | ICD-10-CM | POA: Diagnosis not present

## 2019-08-14 DIAGNOSIS — D759 Disease of blood and blood-forming organs, unspecified: Secondary | ICD-10-CM | POA: Diagnosis not present

## 2019-08-14 DIAGNOSIS — C7800 Secondary malignant neoplasm of unspecified lung: Secondary | ICD-10-CM | POA: Diagnosis not present

## 2019-08-14 LAB — COMPREHENSIVE METABOLIC PANEL
ALT: 115 U/L — ABNORMAL HIGH (ref 0–44)
AST: 48 U/L — ABNORMAL HIGH (ref 15–41)
Albumin: 3.4 g/dL — ABNORMAL LOW (ref 3.5–5.0)
Alkaline Phosphatase: 170 U/L — ABNORMAL HIGH (ref 38–126)
Anion gap: 7 (ref 5–15)
BUN: 10 mg/dL (ref 6–20)
CO2: 24 mmol/L (ref 22–32)
Calcium: 8.8 mg/dL — ABNORMAL LOW (ref 8.9–10.3)
Chloride: 103 mmol/L (ref 98–111)
Creatinine, Ser: 0.62 mg/dL (ref 0.44–1.00)
GFR calc Af Amer: >60 mL/min (ref >60)
GFR calc non Af Amer: >60 mL/min (ref >60)
Glucose, Bld: 92 mg/dL (ref 70–99)
Potassium: 4 mmol/L (ref 3.5–5.1)
Sodium: 134 mmol/L — ABNORMAL LOW (ref 135–145)
Total Bilirubin: 1.2 mg/dL (ref 0.3–1.2)
Total Protein: 6.7 g/dL (ref 6.5–8.1)

## 2019-08-14 LAB — HEMOGLOBIN AND HEMATOCRIT, BLOOD
HCT: 35.3 % — ABNORMAL LOW (ref 36.0–46.0)
Hemoglobin: 11.9 g/dL — ABNORMAL LOW (ref 12.0–15.0)

## 2019-08-14 LAB — CBC
HCT: 35.6 % — ABNORMAL LOW (ref 36.0–46.0)
Hemoglobin: 12.1 g/dL (ref 12.0–15.0)
MCH: 32.7 pg (ref 26.0–34.0)
MCHC: 34 g/dL (ref 30.0–36.0)
MCV: 96.2 fL (ref 80.0–100.0)
Platelets: 90 10*3/uL — ABNORMAL LOW (ref 150–400)
RBC: 3.7 MIL/uL — ABNORMAL LOW (ref 3.87–5.11)
RDW: 13.9 % (ref 11.5–15.5)
WBC: 0.8 10*3/uL — CL (ref 4.0–10.5)
nRBC: 0 % (ref 0.0–0.2)

## 2019-08-14 LAB — TYPE AND SCREEN
ABO/RH(D): O POS
Antibody Screen: NEGATIVE

## 2019-08-14 LAB — URINE CULTURE

## 2019-08-14 LAB — MAGNESIUM: Magnesium: 1.7 mg/dL (ref 1.7–2.4)

## 2019-08-14 MED ORDER — LEVOFLOXACIN 500 MG PO TABS
500.0000 mg | ORAL_TABLET | Freq: Every day | ORAL | Status: DC
  Administered 2019-08-14 – 2019-08-18 (×5): 500 mg via ORAL
  Filled 2019-08-14 (×5): qty 1

## 2019-08-14 MED ORDER — PANTOPRAZOLE SODIUM 40 MG IV SOLR
40.0000 mg | Freq: Two times a day (BID) | INTRAVENOUS | Status: DC
  Administered 2019-08-14 – 2019-08-15 (×2): 40 mg via INTRAVENOUS
  Filled 2019-08-14 (×2): qty 40

## 2019-08-14 MED ORDER — LORATADINE 10 MG PO TABS
10.0000 mg | ORAL_TABLET | Freq: Every day | ORAL | Status: AC
  Administered 2019-08-14 – 2019-08-17 (×4): 10 mg via ORAL
  Filled 2019-08-14 (×4): qty 1

## 2019-08-14 MED ORDER — OXYCODONE HCL 5 MG PO TABS
10.0000 mg | ORAL_TABLET | ORAL | Status: DC | PRN
  Administered 2019-08-14 – 2019-08-19 (×7): 10 mg via ORAL
  Filled 2019-08-14 (×8): qty 2

## 2019-08-14 MED ORDER — ALBUTEROL SULFATE (2.5 MG/3ML) 0.083% IN NEBU: 2.5000 mg | INHALATION_SOLUTION | RESPIRATORY_TRACT | Status: DC | PRN

## 2019-08-14 MED ORDER — PROCHLORPERAZINE EDISYLATE 10 MG/2ML IJ SOLN
10.0000 mg | Freq: Four times a day (QID) | INTRAMUSCULAR | Status: DC | PRN
  Filled 2019-08-14: qty 2

## 2019-08-14 MED ORDER — PROMETHAZINE HCL 25 MG/ML IJ SOLN
25.0000 mg | Freq: Four times a day (QID) | INTRAMUSCULAR | Status: DC | PRN
  Administered 2019-08-14 – 2019-08-19 (×16): 25 mg via INTRAVENOUS
  Filled 2019-08-14 (×17): qty 1

## 2019-08-14 MED ORDER — OXYCODONE HCL ER 15 MG PO T12A
15.0000 mg | EXTENDED_RELEASE_TABLET | Freq: Three times a day (TID) | ORAL | Status: DC
  Administered 2019-08-14 – 2019-08-19 (×14): 15 mg via ORAL
  Filled 2019-08-14 (×14): qty 1

## 2019-08-14 MED ORDER — MAGNESIUM SULFATE 2 GM/50ML IV SOLN
2.0000 g | Freq: Once | INTRAVENOUS | Status: AC
  Administered 2019-08-14: 2 g via INTRAVENOUS
  Filled 2019-08-14: qty 50

## 2019-08-14 NOTE — Progress Notes (Addendum)
[facility] [place] NAME: Maydelin Deming    MR#:  [id number]  DATE OF BIRTH:  11/23/1965  SUBJECTIVE:  CHIEF COMPLAINT:   Chief Complaint  Patient presents with  . Abdominal Pain  . Nausea   Patient with chronic abdominal pains.  No fevers.  No shortness of breath responding.  Seen by palliative care team and pain meds adjusted.  Had some nausea and vomiting this morning.  Started on IV Phenergan  REVIEW OF SYSTEMS:  ROS Constitutional: Positive for malaise/fatigue. Negative for chills and fever.  HENT: Negative for sore throat.   Eyes: Negative for blurred vision and double vision.  Respiratory: Negative for cough, hemoptysis, shortness of breath, wheezing and stridor.   Cardiovascular: Negative for chest pain, palpitations, orthopnea and leg swelling.  Gastrointestinal: Positive for abdominal pain, nausea and vomiting,. Negative for blood in stool, diarrhea and melena.  Genitourinary: Negative for dysuria, flank pain and hematuria.  Musculoskeletal: Negative for back pain and joint pain.  Skin: Negative for rash.  Neurological: Negative for dizziness, sensory change, focal weakness, seizures, loss of consciousness, weakness and headaches.  Endo/Heme/Allergies: Negative for polydipsia.  Psychiatric/Behavioral: Negative for depression. The patient is not nervous/anxious.   DRUG ALLERGIES:   Allergies  Allergen Reactions  . Ketamine Anxiety and Other (See Comments)    Syncope episode/confusion    VITALS:  Blood pressure (!) 112/92, pulse (!) 103, temperature 98.4 F (36.9 C), temperature source Oral, resp. rate 20, height 5' 7"  (1.702 m), weight 74.4 kg, SpO2 100 %. PHYSICAL EXAMINATION:  Physical Exam  GENERAL:  53 y.o.-year-old patient lying in the bed with no acute distress.  EYES: Pupils equal, round, reactive to light and accommodation. No scleral icterus. Extraocular muscles intact.  HEENT: Head atraumatic,  normocephalic. NECK:  Supple, no jugular venous distention. No thyroid enlargement, no tenderness.  LUNGS: Normal breath sounds bilaterally, no wheezing, rales,rhonchi or crepitation. No use of accessory muscles of respiration.  CARDIOVASCULAR: S1, S2 normal. No murmurs, rubs, or gallops.  ABDOMEN: Soft, diffuse tenderness, nondistended. Bowel sounds present. No organomegaly or mass.  EXTREMITIES: No pedal edema, cyanosis, or clubbing.  NEUROLOGIC: Cranial nerves II through XII are intact. Muscle strength 5/5 in all extremities. Sensation intact. Gait not checked.  PSYCHIATRIC: The patient is alert and oriented x 3.  SKIN: No obvious rash, lesion, or ulcer.   LABORATORY PANEL:  Female CBC Recent Labs  Lab 08/14/19 0459  WBC 0.8*  HGB 12.1  HCT 35.6*  PLT 90*   ------------------------------------------------------------------------------------------------------------------ Chemistries  Recent Labs  Lab 08/14/19 0459  NA 134*  K 4.0  CL 103  CO2 24  GLUCOSE 92  BUN 10  CREATININE 0.62  CALCIUM 8.8*  MG 1.7  AST 48*  ALT 115*  ALKPHOS 170*  BILITOT 1.2   RADIOLOGY:  No results found. ASSESSMENT AND PLAN:    1.  Acute on chronic abdominal pain  Most likely related to neoplasm.   Seen by  palliative care team this morning.  Adjusted pain meds for better pain control.   Had some nausea vomiting this morning.  Placed on as needed Phenergan.    2.  History of cervical cancer with lung mets.  Poor prognosis.  Patient with poor tolerance for chemotherapy.  Patient considering stopping further chemotherapy.   Palliative care team consulted.    3.  Neutropenia and neutropenia WBC count of 0.8 and platelet count of 90 Chemotherapy-induced.  Patient started on Granix by  oncologist. Given that patient may have prolonged neutropenia, oncologist started patient on prophylactic antibiotics with levofloxacin.  4.  Hypomagnesemia  Replaced  5.  Transaminitis secondary to  recent chemotherapy Monitor  6.  History of thrombosis and factor V Leyden mutation Patient on anticoagulation with Xarelto Oncologist recommends holding Xarelto if platelet count drops below 50,000.  7.  Adrenal insufficiency. Patient on hydrocortisone 20 mg daily at baseline.  Started on stress dose steroids with Solu-Cortef by oncologist.  To go back to hydrocortisone 20 mg daily after symptoms improve  8.  GI bleed I was later called by nursing staff that patient was having dark bloody stools. Patient on Xarelto which was discontinued.  Stat hemoglobin and hematocrit requested. Patient already on IV fluids.  Placed on IV Protonix. Given severe neutropenia with white count of 0.8, endoscopic evaluation will not be appropriate at this time.  Manage conservatively and transfuse if hemoglobin less than 7.  Hemoglobin stable at 12.1 this morning.  DVT prophylaxis; patient on Xarelto  All the records are reviewed and case discussed with Care Management/Social Worker. Management plans discussed with the patient, family and they are in agreement.  CODE STATUS: DNR  TOTAL TIME TAKING CARE OF THIS PATIENT: 34 minutes.   More than 50% of the time was spent in counseling/coordination of care: YES  POSSIBLE D/C IN 2 DAYS, DEPENDING ON CLINICAL CONDITION.   Abdirahim Flavell M.D on 08/14/2019 at 2:08 PM  Between 7am to 6pm - Pager - [phone]  After 6pm go to [email or web address] - Proofreader  [facility]  Office  [phone]  CC: Primary care physician; Delsa Grana, PA-C  Note: This dictation was prepared with Dragon dictation along with smaller phrase technology. Any transcriptional errors that result from this process are unintentional.

## 2019-08-14 NOTE — Consult Note (Signed)
[place]  Telephone:([phone] [phone]   Name: Joanna Hall Date: 08/14/2019 MRN: [id number]  DOB: [birth year]  Patient Care Team: Delsa Grana, PA-C as PCP - General (Family Medicine) Mellody Drown, MD as Consulting Physician (Obstetrics and Gynecology) [name], MD as Consulting Physician (Gastroenterology) Michael Boston, MD as Consulting Physician (General Surgery) Lovell Sheehan, MD as Consulting Physician (Orthopedic Surgery) Cammie Sickle, MD as Consulting Physician (Oncology) Earnestine Leys, MD as Consulting Physician (Orthopedic Surgery)    [place]: Palliative Care consult requested for [phone] y.o.femalewith multiple medical problems including 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 Patient was recently hospitalized 07/22/2019-07/24/2019 with abdominal pain and constipation.  She is now readmitted 08/13/2019 with intractable abdominal pain.  This is patient's sixth hospitalization in the past two months. Palliative care was consulted to help address symptoms and goals and manage ongoing symptoms.  SOCIAL HISTORY:     reports that she quit smoking about 12 years ago. Her smoking use included cigarettes. She has a 2.50 pack-year smoking history. She has never used smokeless tobacco. She reports previous alcohol use. She reports current drug use. Drug: Marijuana.   Patient is not married. She lives at home with her mother and father. She has an adult daughter who lives in [place]. She had another daughter who is now deceased.  ADVANCE DIRECTIVES:  Not on file  CODE STATUS: DNR  PAST MEDICAL HISTORY: Past Medical  History:  Diagnosis Date   Abdominal pain 06/10/2018   Abnormal cervical Papanicolaou smear 09/18/2017   Anxiety    Aortic atherosclerosis (HCC)    Arthritis    neck and knees   Blood clots in brain    both lungs and right kidney   Blood transfusion without reported diagnosis    Cervical cancer ([place]) 09/2016   mets lung   Chronic anal fissure    Chronic diarrhea    Dyspnea    Erosive gastropathy 09/18/2017   Factor V Leiden mutation ([place])    Fecal incontinence    Genital warts    GERD (gastroesophageal reflux disease)    GI bleed 10/08/2018   Heart murmur    Hematochezia    Hemorrhoids    Hepatitis C    Chronic, 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   Intestinal infection due to Clostridium difficile 09/18/2017   Macrocytic anemia with vitamin B12 deficiency    Multiple gastric ulcers    Nausea vomiting and diarrhea    Pancolitis ([place]) 07/27/2018   Perianal condylomata    Pneumonia    History of   Pulmonary nodules    Rectal bleeding    Small bowel obstruction ([place]) 08/2017   Stiff neck    limited right turn   Vitamin D deficiency     PAST SURGICAL HISTORY:  Past Surgical History:  Procedure Laterality Date   CHOLECYSTECTOMY     COLON SURGERY  08/2017   resection   COLONOSCOPY  WITH PROPOFOL N/A 12/20/2017   Procedure: COLONOSCOPY WITH PROPOFOL;  Surgeon: [name], MD;  Location: [facility] ENDOSCOPY;  Service: Gastroenterology;  Laterality: N/A;   COLONOSCOPY WITH PROPOFOL N/A 07/30/2018   Procedure: COLONOSCOPY WITH PROPOFOL;  Surgeon: [name], MD;  Location: [facility] ENDOSCOPY;  Service: Gastroenterology;  Laterality: N/A;   COLONOSCOPY WITH  PROPOFOL N/A 10/10/2018   Procedure: COLONOSCOPY WITH PROPOFOL;  Surgeon: Lucilla Lame, MD;  Location: [facility] ENDOSCOPY;  Service: Endoscopy;  Laterality: N/A;   DIAGNOSTIC LAPAROSCOPY     ESOPHAGOGASTRODUODENOSCOPY (EGD) WITH PROPOFOL N/A 12/20/2017   Procedure: ESOPHAGOGASTRODUODENOSCOPY (EGD) WITH PROPOFOL;  Surgeon: [name], MD;  Location: [facility] ENDOSCOPY;  Service: Gastroenterology;  Laterality: N/A;   ESOPHAGOGASTRODUODENOSCOPY (EGD) WITH PROPOFOL  07/30/2018   Procedure: ESOPHAGOGASTRODUODENOSCOPY (EGD) WITH PROPOFOL;  Surgeon: [name], MD;  Location: [facility] ENDOSCOPY;  Service: Gastroenterology;;   [place] N/A 11/21/2018   Procedure: FLEXIBLE SIGMOIDOSCOPY;  Surgeon: [name], MD;  Location: [facility] ENDOSCOPY;  Service: Gastroenterology;  Laterality: N/A;   LAPAROTOMY N/A 08/31/2017   Procedure: EXPLORATORY LAPAROTOMY for SBO, ileocolectomy, removal of piece of uterine wall;  Surgeon: [name], MD;  Location: [facility] ORS;  Service: General;  Laterality: N/A;   LASER ABLATION CONDOLAMATA N/A 02/22/2018   Procedure: LASER ABLATION/REMOVAL OF YIFOYDXAJOI AROUND ANUS AND VAGINA;  Surgeon: Michael Boston, MD;  Location: [place];  Service: General;  Laterality: N/A;   OOPHORECTOMY     PORTA CATH INSERTION N/A 05/13/2018   Procedure: PORTA CATH INSERTION;  Surgeon: Katha Cabal, MD;  Location: [place] CV LAB;  Service: Cardiovascular;  Laterality: N/A;   SMALL INTESTINE SURGERY     TANDEM RING INSERTION     x3   THORACOTOMY     TOTAL KNEE ARTHROPLASTY Left 04/24/2018   Procedure: TOTAL KNEE ARTHROPLASTY;  Surgeon: Lovell Sheehan, MD;  Location: [facility] ORS;  Service: Orthopedics;  Laterality: Left;    HEMATOLOGY/ONCOLOGY HISTORY:  Oncology History Overview Note  # dec 2017- CERVICAL ADENO CA []; Stage IB [Dr. Christene Slates at [facility] in [place], [place];No surgery-  Chemo-RT;   # 2018-sep - lung nodules [in [place]]  # AUG 20th, 2019-#1 Carbo-Taxol s/p 6 cycles- [No avastin sec to blood clots]: Feb 2020-bilateral lung nodules with 1 cm in size. March 2020- finished carbo-taxol #7; poor tolerance to [place].  # April 15 th 2020- Start Taxol weekly x 3; one week OFF;  # July 2020-CT- bil cavitary lesions- slightly bigger by few mm/overall STABLE; CT a/p-NED. STOP Taxol;  # July , 23rd 2020- Keytruda [CPS-15 ]x 4 cycles; CT mid OCT 2020-progressive disease in the lungs.;  Stop [facility]  # Oct 22nd Alimta q 3 w  # summer-/fall 2019-Bil PE/kidney infract; factor V Leiden Leiden - xarelto [small bowel obstruction]; [facility]   #Fall 2019 PE/renal infarct on Xarelto-factor V Leiden  # NGS/[facility]- PDL CPS 15; NEG for other targets**  ------------------------------------------------------------- She was treated by  Decision was made to pursue concurrent chemotherapy (weekly cisplatin) and radiation.  She received treatment from 11/2016-05/2017.  01/2017 cisplatin x 2 and carboplatin x 1 (01/29/2017) due to ARF and XRT.  XRT was followed by T & O on 02/01/2017 and T & N 02/10/2017 and 02/20/2017.  Course was complicated by 80 pound weight loss, nausea, vomiting, electrolyte wasting (potassium and magnesium).  She describes that.  Is been sick constantly requiring at least 20 hospitalizations.  Follow-up CT chest  and PET on 06/2017. Per patient, 'radiation worked' and no disease in the abdomen.  At that time she was noted to have lung nodules that were growing and follow-up imaging was scheduled for 10/2017.  She was admitted to hospital in [place] for small bowel obstruction which was managed conservatively and she was home for a week prior to traveling to [place] for Thanksgiving holiday where she has family.  She presented to ER in [place] on 08/2017 with nausea, vomiting, and lower abdominal pain.  Symptoms did not respond to conservative  treatment.   CT on 08/26/2017 revealed small bowel obstruction with transition in the pelvis just superior to the uterus rather was a long segment of distal ileum with fatty wall thickening compatible with chronic inflammation and/or radiation enteritis. Imaging showed numerous pulmonary nodules consistent with metastatic disease. She underwent laparotomy and right ileocolectomy on 08/31/2017 at [facility].  Surgical findings revealed a thickened, matted, and scarred piece of distal small bowel close to the ileocecal valve.  She was discharged on 09/05/2017.  Pain markedly increased in intensity and imaging was performed on 09/11/2017 which revealed: Debris within the anterior abdominal wall incision concerning for infection versus packing material, s/p post ileo-colectomy with expected postoperative changes, mild colonic ileus, numerous pulmonary nodules highly concerning for metastatic disease, punctate nonobstructing nephrolithiasis.  Staples were removed and one was packed.  She was started on doxycycline.  Abdominal and pelvic CT without contrast on 09/11/2017 revealed debris within anterior abdominal wall incision concerning for infection, versus packing material.   She is s/p ileocolectomy with expected postoperative changes and mild colonic ileus.  There were numerous pulmonary nodules highly concerning for metastatic disease and punctate nonobstructing nephrolithiasis. She was readmitted on 09/12/2017.  She describes the onset of lower abdominal pain on 09/09/2017.  Pain markedly increased in intensity on 09/11/2017.   Staples were removed and the wound packed.  She was started on doxycycline.  CT on 09/13/2017 showed postsurgical changes from ileocecectomy with primary ileocolic anastomosis without evidence of abscess or leak, edema small bowel loops of distal ileum, gas within ventral midline surgical wound corresponding to wound infection versus packing material, small infarct at the inferior pole of  left kidney, right uterine infarct.  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   PET scan was ordered to evaluate enlarging lung nodules with concern for recurrent cervical cancer but scan was delayed due to insurance and need to be performed in [place].  Presented to ER on 12/03/2017 for abdominal pain and emesis.  Imaging concerning for worsening possible uterine infarct and she was admitted to hospital.  Pelvic MRI was unremarkable.  Remote scarring type changes of uterus thought to be possibly related to radiation.  She was discharged on 12/08/2017.  Underwent endoscopy and colonoscopy on 12/20/2017.    On 02/22/2018 she underwent laser ablation of condylomata around the anus and vagina under anesthesia with Dr. Johney Maine.   04/15/2018:  Chest, abdomen, and pelvis CT revealed innumerable (> 100) cavitary nodules scattered in the lungs, moderately enlarging compared to the 11/08/2017 PET-CT, suspicious for metastatic disease.  One index node in the RLL measures 1.0 x 1.1 cm (previously 0.6 x 0.6 cm).  There were no new nodules.  There was an ill-defined wall thickening in the rectosigmoid with surrounding stranding along fascia planes, probably sequela from prior radiation therapy.  There was multilevel lumbar impingement due to spondylosis and degenerative disc disease.  There was  heterogeneous enhancement in the uterus (some possibly from prior radiation therapy).   04/23/2018:  PET scan revealed numerous scattered solid and cavitary nodules in the lungs stable increased in size compared to the prior PET-CT from 11/08/2017. Largest nodule was 1.1 cm in the LUL (SUV 1.9).  These demonstrated low-grade metabolic activity up to a maximum SUV of about 2.3, increased from 11/08/2017.    Case was discussed at tumor board on 04/25/2018. Consensus to pursue CT-guided biopsy (05/06/18) which revealed: Metastatic adenocarcinoma, morphologically consistent with cervical  adenocarcinoma.  She has history of chronic hepatitis C which is managed by GI.  Hepatitis C genotype is 2a/2c.  She receives B12 injections for history of B12 deficiency.  On 04/24/2018 she underwent left total knee replacement with Dr. [name]. --------------------------------------------------------   DIAGNOSIS: Cervical adenocarcinoma  STAGE: IV        ;GOALS: Palliative  CURRENT/MOST RECENT THERAPY : Alimta    Malignant neoplasm of overlapping sites of cervix ([place])  01/29/2019 - 04/23/2019 Chemotherapy   The patient had PACLitaxel (TAXOL) 156 mg in sodium chloride 0.9 % 250 mL chemo infusion (</= 31m/m2), 80 mg/m2 = 156 mg, Intravenous,  Once, 3 of 4 cycles Dose modification: 65 mg/m2 (original dose 80 mg/m2, Cycle 1, Reason: Provider Judgment) Administration: 156 mg (01/29/2019), 156 mg (02/05/2019), 126 mg (02/12/2019), 126 mg (02/26/2019), 126 mg (03/13/2019), 126 mg (03/27/2019), 126 mg (03/20/2019), 126 mg (04/03/2019), 126 mg (04/10/2019)  for chemotherapy treatment.    05/08/2019 - 07/30/2019 Chemotherapy   The patient had pembrolizumab (KEYTRUDA) 200 mg in sodium chloride 0.9 % 50 mL chemo infusion, 200 mg, Intravenous, Once, 4 of 6 cycles Administration: 200 mg (05/08/2019), 200 mg (05/29/2019), 200 mg (06/19/2019), 200 mg (07/10/2019)  for chemotherapy treatment.    08/07/2019 -  Chemotherapy   The patient had PEMEtrexed (ALIMTA) 1,000 mg in sodium chloride 0.9 % 100 mL chemo infusion, 950 mg, Intravenous,  Once, 1 of 6 cycles Administration: 1,000 mg (08/07/2019)  for chemotherapy treatment.      ALLERGIES:  is allergic to ketamine.  MEDICATIONS:  Current Facility-Administered Medications  Medication Dose Route Frequency Provider Last Rate Last Dose   0.9 %  sodium chloride infusion   Intravenous Continuous CDemetrios Loll MD 100 mL/hr at 08/14/19 02585    acetaminophen (TYLENOL) tablet 650 mg  650 mg Oral Q6H PRN CDemetrios Loll MD       Or   acetaminophen (TYLENOL) suppository 650 mg   650 mg Rectal Q6H PRN CDemetrios Loll MD       albuterol (PROVENTIL) (2.5 MG/3ML) 0.083% nebulizer solution 2.5 mg  2.5 mg Nebulization Q2H PRN CDemetrios Loll MD       albuterol (PROVENTIL) (2.5 MG/3ML) 0.083% nebulizer solution 2.5 mg  2.5 mg Nebulization Q6H PRN NRito EhrlichA, RPH       amitriptyline (ELAVIL) tablet 75 mg  75 mg Oral QHS CDemetrios Loll MD   75 mg at 08/13/19 2206   bisacodyl (DULCOLAX) EC tablet 5 mg  5 mg Oral Daily PRN CDemetrios Loll MD       calcium-vitamin D (OSCAL WITH D) 500-200 MG-UNIT per tablet 2 tablet  2 tablet Oral Daily CDemetrios Loll MD   2 tablet at 08/14/19 1031   colchicine tablet 0.6 mg  0.6 mg Oral BID CDemetrios Loll MD   0.6 mg at 08/14/19 1030   famotidine (PEPCID) tablet 20 mg  20 mg Oral BID PRN CDemetrios Loll MD       folic acid (  FOLVITE) tablet 1 mg  1 mg Oral Daily Demetrios Loll, MD   1 mg at 08/14/19 1030   hydrocortisone sodium succinate (SOLU-CORTEF) 100 MG injection 60 mg  60 mg Intravenous Daily Earlie Server, MD   60 mg at 08/14/19 1032   HYDROmorphone (DILAUDID) injection 1 mg  1 mg Intravenous Q4H PRN Demetrios Loll, MD   1 mg at 08/14/19 0422   hydrOXYzine (ATARAX/VISTARIL) tablet 10 mg  10 mg Oral TID PRN Demetrios Loll, MD       hyoscyamine (LEVBID) 0.375 MG 12 hr tablet 0.375 mg  0.375 mg Oral Q12H Demetrios Loll, MD   0.375 mg at 08/14/19 1029   loratadine (CLARITIN) tablet 10 mg  10 mg Oral Daily Earlie Server, MD   10 mg at 08/14/19 1030   ondansetron (ZOFRAN) tablet 4 mg  4 mg Oral Q6H PRN Demetrios Loll, MD       Or   ondansetron [facility]) injection 4 mg  4 mg Intravenous Q6H PRN Demetrios Loll, MD   4 mg at 08/14/19 0740   oxyCODONE (Oxy IR/ROXICODONE) immediate release tablet 10 mg  10 mg Oral Q6H PRN Demetrios Loll, MD   10 mg at 08/14/19 0739   oxyCODONE (OXYCONTIN) 12 hr tablet 15 mg  15 mg Oral Q8H Kordelia Severin, Kirt Boys, NP       pantoprazole (PROTONIX) EC tablet 40 mg  40 mg Oral Daily Demetrios Loll, MD   40 mg at 08/14/19 1031   potassium chloride SA (KLOR-CON) CR tablet  20 mEq  20 mEq Oral Daily PRN Demetrios Loll, MD       rivaroxaban Alveda Reasons) tablet 20 mg  20 mg Oral Q supper Demetrios Loll, MD   20 mg at 08/13/19 1805   senna-docusate (Senokot-S) tablet 1 tablet  1 tablet Oral QHS PRN Demetrios Loll, MD       Tbo-Filgrastim Galen Daft) injection 480 mcg  480 mcg Subcutaneous Daily Earlie Server, MD       Facility-Administered Medications Ordered in Other Encounters  Medication Dose Route Frequency Provider Last Rate Last Dose   heparin lock flush 100 unit/mL  500 Units Intravenous Once Corcoran, Melissa C, MD       sodium chloride 0.9 % 1,000 mL with potassium chloride 20 mEq infusion   Intravenous Once Karen Kitchens, NP       sodium chloride flush (NS) 0.9 % injection 10 mL  10 mL Intravenous Once Tykeem Lanzer, Kirt Boys, NP        VITAL SIGNS: BP (!) 112/92 (BP Location: Right Arm)    Pulse (!) 103    Temp 98.4 F (36.9 C) (Oral)    Resp 20    Ht 5' 7"  (1.702 m)    Wt 164 lb (74.4 kg)    SpO2 100%    BMI 25.69 kg/m  Filed Weights   08/13/19 0846  Weight: 164 lb (74.4 kg)    Estimated body mass index is 25.69 kg/m as calculated from the following:   Height as of this encounter: 5' 7"  (1.702 m).   Weight as of this encounter: 164 lb (74.4 kg).  LABS: CBC:    Component Value Date/Time   WBC 0.8 (LL) 08/14/2019 0459   HGB 12.1 08/14/2019 0459   HCT 35.6 (L) 08/14/2019 0459   PLT 90 (L) 08/14/2019 0459   MCV 96.2 08/14/2019 0459   NEUTROABS 0.4 (L) 08/13/2019 0850   LYMPHSABS 0.6 (L) 08/13/2019 0850   MONOABS 0.0 (L) 08/13/2019 9811  EOSABS 0.0 08/13/2019 0850   BASOSABS 0.0 08/13/2019 0850   Comprehensive Metabolic Panel:    Component Value Date/Time   NA 134 (L) 08/14/2019 0459   K 4.0 08/14/2019 0459   CL 103 08/14/2019 0459   CO2 24 08/14/2019 0459   BUN 10 08/14/2019 0459   CREATININE 0.62 08/14/2019 0459   GLUCOSE 92 08/14/2019 0459   CALCIUM 8.8 (L) 08/14/2019 0459   AST 48 (H) 08/14/2019 0459   ALT 115 (H) 08/14/2019 0459   ALKPHOS 170 (H)  08/14/2019 0459   BILITOT 1.2 08/14/2019 0459   PROT 6.7 08/14/2019 0459   ALBUMIN 3.4 (L) 08/14/2019 0459    RADIOGRAPHIC STUDIES: Abd 1 View (kub)  Result Date: 07/23/2019 CLINICAL DATA:  Constipation EXAM: ABDOMEN - 1 VIEW COMPARISON:  Abdominal CT from yesterday FINDINGS: Formed stool throughout the descending and sigmoid colon. Mild stool in the proximal colon. There has been modest evacuation since yesterday. No obstructive bowel dilatation. Right upper quadrant clips. Lumbar spine degeneration with levoscoliosis. Cavitary pulmonary nodules. IMPRESSION: Improving stool burden when compared to CT yesterday. Electronically Signed   By: Monte Fantasia M.D.   On: 07/23/2019 07:22   Ct Chest Wo Contrast  Result Date: 08/01/2019 CLINICAL DATA:  Cervical cancer with lung metastases. EXAM: CT CHEST WITHOUT CONTRAST TECHNIQUE: Multidetector CT imaging of the chest was performed following the standard protocol without IV contrast. COMPARISON:  Abdomen pelvis CT 07/22/2019. Chest CT 05/18/2019. FINDINGS: Cardiovascular: The heart size is normal. No substantial pericardial effusion. Atherosclerotic calcification is noted in the wall of the thoracic aorta. Right Port-A-Cath tip is positioned at the SVC/RA junction. Mediastinum/Nodes: No mediastinal lymphadenopathy. No evidence for gross hilar lymphadenopathy although assessment is limited by the lack of intravenous contrast on today's study. The esophagus has normal imaging features. There is no axillary lymphadenopathy. Lungs/Pleura: Innumerable bilateral pulmonary nodules are evident, many of which are cavitary. Index right upper lobe nodule on 59/3 measures 1.2 cm today increased from 0.9 cm when I remeasure in a similar fashion on the prior study. Measuring another index nodule in the left lower lobe (63/3) this nodule is 1.4 cm today compared to 1.2 cm when remeasured on the prior study. A third nodule in the right lung base (96/3) is 1.7 cm today  compared to 1.5 cm when remeasured similarly on the prior study. No pleural effusion. Upper Abdomen: Unremarkable. Musculoskeletal: No worrisome lytic or sclerotic osseous abnormality. IMPRESSION: 1. Interval progression of innumerable bilateral pulmonary nodules, many of which are cavitary. Imaging features are compatible with progression of metastatic disease. 2.  Aortic Atherosclerois (ICD10-170.0) Electronically Signed   By: Misty Stanley M.D.   On: 08/01/2019 14:09   Ct Abdomen Pelvis W Contrast  Result Date: 08/13/2019 CLINICAL DATA:  Abdominal pain EXAM: CT ABDOMEN AND PELVIS WITH CONTRAST TECHNIQUE: Multidetector CT imaging of the abdomen and pelvis was performed using the standard protocol following bolus administration of intravenous contrast. CONTRAST:  138m OMNIPAQUE IOHEXOL 300 MG/ML  SOLN COMPARISON:  07/22/2019 FINDINGS: Lower chest: Numerous bibasilar cavitary nodules are again seen and are similar prior study. The largest in the right lower lobe measures 16 mm, stable. Hepatobiliary: Prior cholecystectomy. Mild intrahepatic and extrahepatic biliary ductal dilatation. Common bile duct measures up to 13 mm, stable. This is likely related to post cholecystectomy state. No focal hepatic abnormality. Pancreas: No focal abnormality or ductal dilatation. Spleen: No focal abnormality.  Normal size. Adrenals/Urinary Tract: No adrenal abnormality. No focal renal abnormality. No stones or hydronephrosis. Urinary  bladder is unremarkable. Stomach/Bowel: Stomach, large and small bowel grossly unremarkable. Vascular/Lymphatic: Aortic atherosclerosis. No enlarged abdominal or pelvic lymph nodes. Reproductive: Uterus and adnexa unremarkable.  No mass. Other: No free fluid or free air. Musculoskeletal: No acute bony abnormality. IMPRESSION: Numerous bilateral lower lobe cavitary nodules, stable since prior study. Stable intrahepatic and extrahepatic biliary ductal dilatation, likely related to post  cholecystectomy state. Aortic atherosclerosis. No acute findings in the abdomen or pelvis. Electronically Signed   By: Rolm Baptise M.D.   On: 08/13/2019 11:07   Ct Abdomen Pelvis W Contrast  Result Date: 07/22/2019 CLINICAL DATA:  Constipation. No bowel movement in 3 days. History of bowel obstructions and bowel resection. EXAM: CT ABDOMEN AND PELVIS WITH CONTRAST TECHNIQUE: Multidetector CT imaging of the abdomen and pelvis was performed using the standard protocol following bolus administration of intravenous contrast. CONTRAST:  157m OMNIPAQUE IOHEXOL 300 MG/ML  SOLN COMPARISON:  06/21/2019 FINDINGS: Lower chest: Multiple lung base nodules, many cavitary. Mild overall increase in the size of pulmonary nodules. There is a cavitary nodule in the posteromedial right lower lobe, image 1, series 4, 12 mm, previously 9 mm. Left lower lobe nodule, image 4, series 4, 11 mm, previously 9.5 mm. Hepatobiliary: Liver normal in size and attenuation. No mass there is intrahepatic bile duct dilation similar to the prior study. Common bile duct is dilated to a maximum of 1.3 cm. It tapers distally. This is also stable. Status post cholecystectomy. Pancreas: Unremarkable. No pancreatic ductal dilatation or surrounding inflammatory changes. Spleen: Normal in size without focal abnormality. Adrenals/Urinary Tract: No adrenal masses. Kidneys normal in size, orientation and position. Symmetric renal enhancement and excretion. Tiny bilateral nonobstructing intrarenal stones. No renal masses. No hydronephrosis. Ureters normal in course and in caliber. Bladder is unremarkable. Stomach/Bowel: Status post partial right hemicolectomy. No evidence of bowel obstruction. No bowel wall thickening or inflammation. Moderate increased stool burden noted throughout the colon. Stomach is unremarkable. Vascular/Lymphatic: Aortic atherosclerosis. No enlarged abdominal or pelvic lymph nodes. Reproductive: Questionable residual uterine tissue  above the vaginal cuff measuring 3.3 x 3.1 x 3.5 cm. History reports cervical cancer with metastatic disease to the lung. It is unclear whether there has been a hysterectomy. This appearance is stable from the prior CT. No adnexal masses. Other: No abdominal wall hernia or abnormality. No abdominopelvic ascites. Musculoskeletal: No fracture or acute finding. No osteoblastic or osteolytic lesions. IMPRESSION: 1. No acute findings within the abdomen or pelvis. No evidence of bowel obstruction. 2. Moderate constipation with increased stool burden throughout the colon. Stable changes from a partial right hemicolectomy. 3. Multiple lung base nodules, many cavitary, with some progression from the prior CT, several nodules having increased in size. 4. Soft tissue above the vaginal cuff, which may reflect residual uterine tissue. This is stable on multiple prior CTs. 5. Aortic atherosclerosis. 6. Chronic intra and extrahepatic bile duct dilation. Status post cholecystectomy. 7. Tiny nonobstructing intrarenal stones. Electronically Signed   By: DLajean ManesM.D.   On: 07/22/2019 11:40   Dg Fluoro Guide Cv Line Right  Result Date: 08/05/2019 INDICATION: No blood return from port. EXAM: Port-A-Cath check. MEDICATIONS: None.; The antibiotic was administered within an appropriate time interval prior to skin puncture. ANESTHESIA/SEDATION: None. FLUOROSCOPY TIME:  Fluoroscopy Time: 0 minutes minutes 24 seconds (at 10 mGy). COMPLICATIONS: None. PROCEDURE: Through the existing Port-A-Cath access approximately 10 cc of Omnipaque 300 was administered. Frontal and oblique views obtained. Port-A-Cath is in widely patent. No evidence of fracture or displacement. No collagen  sheath noted. IMPRESSION: Normal exam.  Widely patent Port-A-Cath. Electronically Signed   By: Marcello Moores  Register   On: 08/05/2019 12:14    PERFORMANCE STATUS (ECOG) : 2 - Symptomatic, <50% confined to bed  Review of Systems Unless otherwise noted, a  complete review of systems is negative.  Physical Exam General: NAD, frail appearing, thin Pulmonary: unlabored Extremities: no edema, no joint deformities Skin: no rashes Neurological: Weakness but otherwise nonfocal  IMPRESSION: Patient is well-known to me from the clinic.  I had a lengthy conversation with her today about goals of care.  She says that her quality of life has become extremely poor and that she is no longer able to travel or engage in activities she finds enjoyable due to the high symptom burden associated with her disease and treatment.  Patient says she recognizes that her cancer will ultimately be terminal and that she wants to find some enjoyment out of life prior to her passing.  She verbalized that she has already outlived the initial prognosis of the cancer. She was recently rotated to [facility] and says that she would like to try several treatments before making any final decisions but thinks that she may ultimately decide to stop treatment and focus on comfort and hospice care.  Patient tells me that she would like to ultimately die at home and cares greatly that she is comfortable and has dignity at end-of-life.  Patient plans to speak with her family about decision-making.  She wants to plan her funeral and end-of-life care.  We have previously discussed establishing ACP documents, which patient has completed but not notarized.  Patient would ultimately want her daughter to be her decision-maker if necessary.  Emotional support provided.  Patient has strong faith in God and says that she is not afraid to die.  She verbalized a desire to embrace the afterlife where she may again see her daughter who died some years ago.  We will ask that the chaplain come by and follow her during his hospitalization.  We discussed CODE STATUS.  Patient says that she would not want to be resuscitated nor have her life prolonged artificially on machines.  She plans to tell her family of this  decision.  She was in agreement with me changing the CODE STATUS to DNR.  I signed a DNR order and placed it on the chart.  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  She is managed outpatient with OxyContin 15 mg every 12 hours and oxycodone IR 10 mg, which she takes several times a day as needed for breakthrough pain.  We will increase OxyContin to every 8 hours and liberalize dosing of as needed oxycodone for breakthrough pain.  Patient has scheduled follow-up visit in the cancer center on 11/12 to see Dr. Rogue Bussing for consideration of future treatment.  We will also plan palliative care follow-up visit on the same day.  PLAN: -Continue current scope of treatment -Increase OxyContin to 15 mg every 8 hours -Increase oxycodone IR to 10 mg every 3 hours as needed for breakthrough pain -DNR/DNI -Chaplain consult -Follow-up outpatient palliative care visit on 11/12   Time Total: 60 minutes  Visit consisted of counseling and education dealing with the complex and emotionally intense issues of symptom management and palliative care in the setting of serious and potentially life-threatening illness.Greater than 50%  of this time was spent counseling and coordinating care related to the above assessment and plan.  Signed by:  Altha Harm, PhD, NP-C

## 2019-08-14 NOTE — Progress Notes (Addendum)
Hematology/Oncology Progress Note [facility] [phone] [phone]  Patient Care Team: Delsa Grana, PA-C as PCP - General (Family Medicine) Mellody Drown, MD as Consulting Physician (Obstetrics and Gynecology) [name], MD as Consulting Physician (Gastroenterology) Michael Boston, MD as Consulting Physician (General Surgery) Lovell Sheehan, MD as Consulting Physician (Orthopedic Surgery) Cammie Sickle, MD as Consulting Physician (Oncology) Earnestine Leys, MD as Consulting Physician (Orthopedic Surgery)   Name of the patient: Joanna Hall  [id number]  1966-09-20  Date of visit: 08/14/19   INTERVAL HISTORY-  Patient still feels nauseated and vomited today.  Little symptom relief from Zofran every 6 hours. Still have ongoing abdominal pain Started on Granix yesterday for neutropenia.    Review of systems- Review of Systems  Constitutional: Positive for appetite change and fatigue. Negative for chills and fever.  HENT:   Negative for hearing loss and voice change.   Eyes: Negative for eye problems.  Respiratory: Negative for chest tightness, cough and shortness of breath.   Cardiovascular: Negative for chest pain.  Gastrointestinal: Positive for abdominal pain, diarrhea, nausea and vomiting. Negative for abdominal distention and blood in stool.  Endocrine: Negative for hot flashes.  Genitourinary: Positive for dysuria. Negative for difficulty urinating and frequency.   Musculoskeletal: Negative for arthralgias.  Skin: Negative for itching and rash.  Neurological: Negative for extremity weakness.  Psychiatric/Behavioral: Negative for confusion.    Allergies  Allergen Reactions  . Ketamine Anxiety and Other (See Comments)    Syncope episode/confusion     Patient Active Problem List   Diagnosis Date Noted  . Intractable lower abdominal pain   . Abdominal pain 07/23/2019  . Constipation 07/22/2019  .  Malnutrition of moderate degree 06/22/2019  . Small bowel obstruction due to adhesions ([place]) 06/21/2019  . Acute gastroenteritis 06/14/2019  . Intractable nausea and vomiting 06/07/2019  . Chest pain in adult 05/19/2019  . Intractable pain 05/19/2019  . Cancer associated pain   . Proctitis, radiation   . Palliative care encounter   . Encounter for monitoring opioid maintenance therapy 11/19/2018  . Adrenal insufficiency ([place]) 09/03/2018  . Chronic diarrhea 06/25/2018  . Chronic anticoagulation 06/25/2018  . Vomiting   . Encounter for antineoplastic chemotherapy 06/04/2018  . 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 Lung metastasis ([place]) 05/15/2018  . Closed fracture of distal end of radius 05/04/2018  . History of total knee arthroplasty 04/24/2018  . Osteoarthritis of left knee 02/28/2018  . Condyloma acuminatum of anus s/p ablation 02/22/2018 02/22/2018  . Condyloma acuminatum of vagina s/p ablation 02/22/2018 02/22/2018  . Positive ANA (antinuclear antibody) 02/04/2018  . Aortic atherosclerosis ([place]) 01/15/2018  . Elevated MCV 01/15/2018  . Anemia 01/15/2018  . Genital warts 01/15/2018  . Vitamin D deficiency 01/15/2018  . Pernicious anemia   . Impingement syndrome of shoulder region 12/19/2017  . Multiple joint pain 12/19/2017  . Osteoarthritis of right knee 12/19/2017  . B12 deficiency 12/11/2017  . Lung nodules 12/11/2017  . History of Clostridium difficile infection 10/23/2017  . Factor V Leiden ([place]) 10/19/2017  . Goals of care, counseling/discussion 10/19/2017  . Cervical cancer, FIGO stage IB1 ([place]) 09/18/2017  . Chronic hepatitis C without hepatic coma ([place]) 09/18/2017  . Cytopenia 09/18/2017  . Diarrhea 09/18/2017  . Lower abdominal pain 09/18/2017  . 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 Malignant neoplasm of overlapping sites of cervix ([place]) 09/18/2017  . Renal insufficiency 09/18/2017  . Wound infection after surgery 09/12/2017  . Hypokalemia   .  Hypomagnesemia   . Cervical arthritis 07/18/2017  . Dysuria 06/20/2017  . Metastatic cancer ([place]) 05/12/2017  . Essential hypertension 03/15/2017  . Anemia in other chronic diseases classified elsewhere 03/01/2017  . Chemotherapy-induced neutropenia ([place]) 01/29/2017  . 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[place]) 09/25/2016     Past Medical History:  Diagnosis Date  . Abdominal pain 06/10/2018  . Abnormal cervical Papanicolaou smear 09/18/2017  . Anxiety   . Aortic atherosclerosis ([place])   . Arthritis    neck and knees  . 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 Blood transfusion without reported diagnosis   . Cervical cancer ([place]) 09/2016   mets lung  . Chronic anal fissure   . Chronic diarrhea   . Dyspnea   . Erosive gastropathy 09/18/2017  . Factor V Leiden mutation ([place])   . Fecal incontinence   . Genital warts   . GERD (gastroesophageal reflux disease)   . GI bleed 10/08/2018  . Heart murmur   . Hematochezia   . Hemorrhoids   . 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 Hepatitis, chronic ([place]) 05/05/2017  . History of cancer chemotherapy    completed 06/2017  . History of Clostridium difficile infection    while undergoing chemo.  Negative test 10/2017  . Ileocolic anastomotic leak   . Infarction of kidney ([place]) left kidney   and uterus  . Intestinal infection due to Clostridium difficile 09/18/2017  . Macrocytic anemia with vitamin B12 deficiency   . Multiple gastric ulcers   . Nausea vomiting and diarrhea   . Pancolitis ([place]) 07/27/2018  . Perianal condylomata   . Pneumonia    History of  . Pulmonary nodules   . Rectal bleeding   . Small bowel obstruction ([place]) 08/2017  . Stiff neck    limited right turn  . Vitamin D deficiency      Past Surgical History:  Procedure Laterality Date  . CHOLECYSTECTOMY    . COLON SURGERY  08/2017   resection  . COLONOSCOPY WITH PROPOFOL N/A 12/20/2017   Procedure: COLONOSCOPY WITH  PROPOFOL;  Surgeon: [name], MD;  Location: [facility] ENDOSCOPY;  Service: Gastroenterology;  Laterality: N/A;  . COLONOSCOPY WITH PROPOFOL N/A 07/30/2018   Procedure: COLONOSCOPY WITH PROPOFOL;  Surgeon: [name], MD;  Location[facility] ENDOSCOPY;  Service: Gastroenterology;  Laterality: N/A;  . COLONOSCOPY WITH PROPOFOL N/A 10/10/2018   Procedure: COLONOSCOPY WITH PROPOFOL;  Surgeon: Lucilla Lame, MD;  Location: [facility] ENDOSCOPY;  Service: Endoscopy;  Laterality: N/A;  . DIAGNOSTIC LAPAROSCOPY    . ESOPHAGOGASTRODUODENOSCOPY (EGD) WITH PROPOFOL N/A 12/20/2017   Procedure: ESOPHAGOGASTRODUODENOSCOPY (EGD) WITH PROPOFOL;  Surgeon: [name], MD;  Location: [place];  Service: Gastroenterology;  Laterality: N/A;  . ESOPHAGOGASTRODUODENOSCOPY (EGD) WITH PROPOFOL  07/30/2018   Procedure: ESOPHAGOGASTRODUODENOSCOPY (EGD) WITH PROPOFOL;  Surgeon: [name], MD;  Location: [facility] ENDOSCOPY;  Service: Gastroenterology;;  . Otho Darner SIGMOIDOSCOPY N/A 11/21/2018   Procedure: FLEXIBLE SIGMOIDOSCOPY;  Surgeon: [name], MD;  Location: [facility] ENDOSCOPY;  Service: Gastroenterology;  Laterality: N/A;  . LAPAROTOMY N/A 08/31/2017   Procedure: EXPLORATORY LAPAROTOMY for SBO, ileocolectomy, removal of piece of uterine wall;  Surgeon: [name], MD;  Location: [facility] ORS;  Service: General;  Laterality: N/A;  . LASER ABLATION CONDOLAMATA N/A 02/22/2018   Procedure: LASER ABLATION/REMOVAL OF [id number] AROUND ANUS AND VAGINA;  Surgeon: Michael Boston, MD;  Location: [place];  Service: General;  Laterality: N/A;  . OOPHORECTOMY    . PORTA CATH INSERTION N/A 05/13/2018   Procedure: PORTA CATH INSERTION;  Surgeon: Katha Cabal, MD;  Location: [place] CV LAB;  Service: Cardiovascular;  Laterality: N/A;  . SMALL INTESTINE SURGERY    . TANDEM RING INSERTION     x3  . THORACOTOMY    . TOTAL KNEE ARTHROPLASTY Left 04/24/2018   Procedure: TOTAL KNEE  ARTHROPLASTY;  Surgeon: Lovell Sheehan, MD;  Location: [facility] ORS;  Service: Orthopedics;  Laterality: Left;    Social History   Socioeconomic History  . Marital status: Divorced    Spouse name: Not on file  . Number of children: Not on file  . Years of education: Not on file  . Highest education level: Not on file  Occupational History  . Not on file  Social Needs  . Financial resource strain: Not hard at all  . Food insecurity    Worry: Never true    Inability: Never true  . Transportation needs    Medical: No    Non-medical: No  Tobacco Use  . Smoking status: Former Smoker    Packs/day: 0.25    Years: 10.00    Pack years: 2.50    Types: Cigarettes    Quit date: 10/16/2006    Years since quitting: 12.8  . Smokeless tobacco: Never Used  Substance and Sexual Activity  . Alcohol use: Not Currently    Frequency: Never    Comment: seldom  . Drug use: Yes    Types: Marijuana    Comment: not very often  . Sexual activity: Not Currently    Birth control/protection: Post-menopausal    Comment: Not Asked  Lifestyle  . Physical activity    Days per week: Patient refused    Minutes per session: Patient refused  . Stress: Only a little  Relationships  . Social Herbalist on phone: Patient refused    Gets together: Patient refused    Attends religious service: Patient refused    Active member of club or organization: Patient refused    Attends meetings of clubs or organizations: Patient refused    Relationship status: Patient refused  . Intimate partner violence    Fear of current or ex partner: No    Emotionally abused: No    Physically abused: No    Forced sexual activity: No  Other Topics Concern  . Not on file  Social History Narrative  . Not on file     Family History  Problem Relation Age of Onset  . Hypertension Father   . Diabetes Father   . Alcohol abuse Daughter   . Hypertension Maternal Grandmother   . Diabetes Maternal Grandmother   .  Diabetes Paternal Grandmother   . Hypertension Paternal Grandmother      Current Facility-Administered Medications:  .  0.9 %  sodium chloride infusion, , Intravenous, Continuous, Demetrios Loll, MD, Last Rate: 100 mL/hr at 08/14/19 0620 .  acetaminophen (TYLENOL) tablet 650 mg, 650 mg, Oral, Q6H PRN **OR** acetaminophen (TYLENOL) suppository 650 mg, 650 mg, Rectal, Q6H PRN, Demetrios Loll, MD .  albuterol (PROVENTIL) (2.5 MG/3ML) 0.083% nebulizer solution 2.5 mg, 2.5 mg, Nebulization, Q4H PRN, Ojie, Jude, MD .  amitriptyline (ELAVIL) tablet 75 mg, 75 mg, Oral, QHS, Demetrios Loll, MD, 75 mg at 08/13/19 2206 .  bisacodyl (DULCOLAX) EC tablet 5 mg, 5 mg, Oral, Daily PRN, Demetrios Loll, MD .  calcium-vitamin D Darron Doom WITH  D) 500-200 MG-UNIT per tablet 2 tablet, 2 tablet, Oral, Daily, Demetrios Loll, MD, 2 tablet at 08/14/19 1031 .  colchicine tablet 0.6 mg, 0.6 mg, Oral, BID, Demetrios Loll, MD, 0.6 mg at 08/14/19 1030 .  famotidine (PEPCID) tablet 20 mg, 20 mg, Oral, BID PRN, Demetrios Loll, MD .  folic acid (FOLVITE) tablet 1 mg, 1 mg, Oral, Daily, Demetrios Loll, MD, 1 mg at 08/14/19 1030 .  hydrocortisone sodium succinate (SOLU-CORTEF) 100 MG injection 60 mg, 60 mg, Intravenous, Daily, Earlie Server, MD, 60 mg at 08/14/19 1032 .  HYDROmorphone (DILAUDID) injection 1 mg, 1 mg, Intravenous, Q4H PRN, Demetrios Loll, MD, 1 mg at 08/14/19 0422 .  hydrOXYzine (ATARAX/VISTARIL) tablet 10 mg, 10 mg, Oral, TID PRN, Demetrios Loll, MD .  hyoscyamine (LEVBID) 0.375 MG 12 hr tablet 0.375 mg, 0.375 mg, Oral, Q12H, Demetrios Loll, MD, 0.375 mg at 08/14/19 1029 .  levofloxacin (LEVAQUIN) tablet 500 mg, 500 mg, Oral, Daily, Earlie Server, MD .  loratadine (CLARITIN) tablet 10 mg, 10 mg, Oral, Daily, Earlie Server, MD, 10 mg at 08/14/19 1030 .  magnesium sulfate IVPB 2 g 50 mL, 2 g, Intravenous, Once, Ojie, Jude, MD .  oxyCODONE (Oxy IR/ROXICODONE) immediate release tablet 10 mg, 10 mg, Oral, Q3H PRN, Borders, Vonna Kotyk R, NP .  oxyCODONE (OXYCONTIN) 12 hr tablet 15  mg, 15 mg, Oral, Q8H, Borders, Joshua R, NP .  pantoprazole (PROTONIX) EC tablet 40 mg, 40 mg, Oral, Daily, Demetrios Loll, MD, 40 mg at 08/14/19 1031 .  potassium chloride SA (KLOR-CON) CR tablet 20 mEq, 20 mEq, Oral, Daily PRN, Demetrios Loll, MD .  promethazine (PHENERGAN) injection 25 mg, 25 mg, Intravenous, Q6H PRN, Stark Jock, Jude, MD, 25 mg at 08/14/19 1253 .  rivaroxaban (XARELTO) tablet 20 mg, 20 mg, Oral, Q supper, Demetrios Loll, MD, 20 mg at 08/13/19 1805 .  senna-docusate (Senokot-S) tablet 1 tablet, 1 tablet, Oral, QHS PRN, Demetrios Loll, MD .  Tbo-Filgrastim [facility]) injection 480 mcg, 480 mcg, Subcutaneous, Daily, Earlie Server, MD, 480 mcg at 08/14/19 1152  Facility-Administered Medications Ordered in Other Encounters:  .  heparin lock flush 100 unit/mL, 500 Units, Intravenous, Once, Corcoran, Melissa C, MD .  sodium chloride 0.9 % 1,000 mL with potassium chloride 20 mEq infusion, , Intravenous, Once, Honor Loh E, NP .  sodium chloride flush (NS) 0.9 % injection 10 mL, 10 mL, Intravenous, Once, Borders, Kirt Boys, NP   Physical exam:  Vitals:   08/13/19 1808 08/13/19 1857 08/13/19 2010 08/14/19 0404  BP: 116/86 137/84 (!) 150/113 (!) 112/92  Pulse:  77 97 (!) 103  Resp: 15 19 18 20   Temp:  98.1 F (36.7 C) 99.5 F (37.5 C) 98.4 F (36.9 C)  TempSrc:   Oral Oral  SpO2:  100% 100% 100%  Weight:      Height:       Physical Exam  Constitutional: She is oriented to person, place, and time. No distress.  Chronic ill appearance  HENT:  Head: Normocephalic and atraumatic.  Mouth/Throat: No oropharyngeal exudate.  Eyes: Pupils are equal, round, and reactive to light. EOM are normal. No scleral icterus.  Neck: Normal range of motion. Neck supple.  Cardiovascular: Normal rate and regular rhythm.  No murmur heard. Pulmonary/Chest: Effort normal and breath sounds normal. No respiratory distress.  Abdominal: Soft. Bowel sounds are normal. She exhibits no distension.  Musculoskeletal: Normal range  of motion.        General: No edema.  Neurological: She is alert  and oriented to person, place, and time. No cranial nerve deficit. She exhibits normal muscle tone. Coordination normal.  Skin: Skin is warm and dry. She is not diaphoretic. No erythema.  Psychiatric: Affect normal.       CMP Latest Ref Rng & Units 08/14/2019  Glucose 70 - 99 mg/dL 92  BUN 6 - 20 mg/dL 10  Creatinine 0.44 - 1.00 mg/dL 0.62  Sodium 135 - 145 mmol/L 134(L)  Potassium 3.5 - 5.1 mmol/L 4.0  Chloride 98 - 111 mmol/L 103  CO2 22 - 32 mmol/L 24  Calcium 8.9 - 10.3 mg/dL 8.8(L)  Total Protein 6.5 - 8.1 g/dL 6.7  Total Bilirubin 0.3 - 1.2 mg/dL 1.2  Alkaline Phos 38 - 126 U/L 170(H)  AST 15 - 41 U/L 48(H)  ALT 0 - 44 U/L 115(H)   CBC Latest Ref Rng & Units 08/14/2019  WBC 4.0 - 10.5 K/uL 0.8(LL)  Hemoglobin 12.0 - 15.0 g/dL 12.1  Hematocrit 36.0 - 46.0 % 35.6(L)  Platelets 150 - 400 K/uL 90(L)   RADIOGRAPHIC STUDIES: I have personally reviewed the radiological images as listed and agreed with the findings in the report.  Abd 1 View (kub)  Result Date: 07/23/2019 CLINICAL DATA:  Constipation EXAM: ABDOMEN - 1 VIEW COMPARISON:  Abdominal CT from yesterday FINDINGS: Formed stool throughout the descending and sigmoid colon. Mild stool in the proximal colon. There has been modest evacuation since yesterday. No obstructive bowel dilatation. Right upper quadrant clips. Lumbar spine degeneration with levoscoliosis. Cavitary pulmonary nodules. IMPRESSION: Improving stool burden when compared to CT yesterday. Electronically Signed   By: Monte Fantasia M.D.   On: 07/23/2019 07:22   Ct Chest Wo Contrast  Result Date: 08/01/2019 CLINICAL DATA:  Cervical cancer with lung metastases. EXAM: CT CHEST WITHOUT CONTRAST TECHNIQUE: Multidetector CT imaging of the chest was performed following the standard protocol without IV contrast. COMPARISON:  Abdomen pelvis CT 07/22/2019. Chest CT 05/18/2019. FINDINGS: Cardiovascular:  The heart size is normal. No substantial pericardial effusion. Atherosclerotic calcification is noted in the wall of the thoracic aorta. Right Port-A-Cath tip is positioned at the SVC/RA junction. Mediastinum/Nodes: No mediastinal lymphadenopathy. No evidence for gross hilar lymphadenopathy although assessment is limited by the lack of intravenous contrast on today's study. The esophagus has normal imaging features. There is no axillary lymphadenopathy. Lungs/Pleura: Innumerable bilateral pulmonary nodules are evident, many of which are cavitary. Index right upper lobe nodule on 59/3 measures 1.2 cm today increased from 0.9 cm when I remeasure in a similar fashion on the prior study. Measuring another index nodule in the left lower lobe (63/3) this nodule is 1.4 cm today compared to 1.2 cm when remeasured on the prior study. A third nodule in the right lung base (96/3) is 1.7 cm today compared to 1.5 cm when remeasured similarly on the prior study. No pleural effusion. Upper Abdomen: Unremarkable. Musculoskeletal: No worrisome lytic or sclerotic osseous abnormality. IMPRESSION: 1. Interval progression of innumerable bilateral pulmonary nodules, many of which are cavitary. Imaging features are compatible with progression of metastatic disease. 2.  Aortic Atherosclerois (ICD10-170.0) Electronically Signed   By: Misty Stanley M.D.   On: 08/01/2019 14:09   Ct Abdomen Pelvis W Contrast  Result Date: 08/13/2019 CLINICAL DATA:  Abdominal pain EXAM: CT ABDOMEN AND PELVIS WITH CONTRAST TECHNIQUE: Multidetector CT imaging of the abdomen and pelvis was performed using the standard protocol following bolus administration of intravenous contrast. CONTRAST:  175m OMNIPAQUE IOHEXOL 300 MG/ML  SOLN COMPARISON:  07/22/2019 FINDINGS:  Lower chest: Numerous bibasilar cavitary nodules are again seen and are similar prior study. The largest in the right lower lobe measures 16 mm, stable. Hepatobiliary: Prior cholecystectomy. Mild  intrahepatic and extrahepatic biliary ductal dilatation. Common bile duct measures up to 13 mm, stable. This is likely related to post cholecystectomy state. No focal hepatic abnormality. Pancreas: No focal abnormality or ductal dilatation. Spleen: No focal abnormality.  Normal size. Adrenals/Urinary Tract: No adrenal abnormality. No focal renal abnormality. No stones or hydronephrosis. Urinary bladder is unremarkable. Stomach/Bowel: Stomach, large and small bowel grossly unremarkable. Vascular/Lymphatic: Aortic atherosclerosis. No enlarged abdominal or pelvic lymph nodes. Reproductive: Uterus and adnexa unremarkable.  No mass. Other: No free fluid or free air. Musculoskeletal: No acute bony abnormality. IMPRESSION: Numerous bilateral lower lobe cavitary nodules, stable since prior study. Stable intrahepatic and extrahepatic biliary ductal dilatation, likely related to post cholecystectomy state. Aortic atherosclerosis. No acute findings in the abdomen or pelvis. Electronically Signed   By: Rolm Baptise M.D.   On: 08/13/2019 11:07   Ct Abdomen Pelvis W Contrast  Result Date: 07/22/2019 CLINICAL DATA:  Constipation. No bowel movement in 3 days. History of bowel obstructions and bowel resection. EXAM: CT ABDOMEN AND PELVIS WITH CONTRAST TECHNIQUE: Multidetector CT imaging of the abdomen and pelvis was performed using the standard protocol following bolus administration of intravenous contrast. CONTRAST:  164m OMNIPAQUE IOHEXOL 300 MG/ML  SOLN COMPARISON:  06/21/2019 FINDINGS: Lower chest: Multiple lung base nodules, many cavitary. Mild overall increase in the size of pulmonary nodules. There is a cavitary nodule in the posteromedial right lower lobe, image 1, series 4, 12 mm, previously 9 mm. Left lower lobe nodule, image 4, series 4, 11 mm, previously 9.5 mm. Hepatobiliary: Liver normal in size and attenuation. No mass there is intrahepatic bile duct dilation similar to the prior study. Common bile duct is  dilated to a maximum of 1.3 cm. It tapers distally. This is also stable. Status post cholecystectomy. Pancreas: Unremarkable. No pancreatic ductal dilatation or surrounding inflammatory changes. Spleen: Normal in size without focal abnormality. Adrenals/Urinary Tract: No adrenal masses. Kidneys normal in size, orientation and position. Symmetric renal enhancement and excretion. Tiny bilateral nonobstructing intrarenal stones. No renal masses. No hydronephrosis. Ureters normal in course and in caliber. Bladder is unremarkable. Stomach/Bowel: Status post partial right hemicolectomy. No evidence of bowel obstruction. No bowel wall thickening or inflammation. Moderate increased stool burden noted throughout the colon. Stomach is unremarkable. Vascular/Lymphatic: Aortic atherosclerosis. No enlarged abdominal or pelvic lymph nodes. Reproductive: Questionable residual uterine tissue above the vaginal cuff measuring 3.3 x 3.1 x 3.5 cm. History reports cervical cancer with metastatic disease to the lung. It is unclear whether there has been a hysterectomy. This appearance is stable from the prior CT. No adnexal masses. Other: No abdominal wall hernia or abnormality. No abdominopelvic ascites. Musculoskeletal: No fracture or acute finding. No osteoblastic or osteolytic lesions. IMPRESSION: 1. No acute findings within the abdomen or pelvis. No evidence of bowel obstruction. 2. Moderate constipation with increased stool burden throughout the colon. Stable changes from a partial right hemicolectomy. 3. Multiple lung base nodules, many cavitary, with some progression from the prior CT, several nodules having increased in size. 4. Soft tissue above the vaginal cuff, which may reflect residual uterine tissue. This is stable on multiple prior CTs. 5. Aortic atherosclerosis. 6. Chronic intra and extrahepatic bile duct dilation. Status post cholecystectomy. 7. Tiny nonobstructing intrarenal stones. Electronically Signed   By: DLajean ManesM.D.   On: 07/22/2019 11:40  Dg Fluoro Guide Cv Line Right  Result Date: 08/05/2019 INDICATION: No blood return from port. EXAM: Port-A-Cath check. MEDICATIONS: None.; The antibiotic was administered within an appropriate time interval prior to skin puncture. ANESTHESIA/SEDATION: None. FLUOROSCOPY TIME:  Fluoroscopy Time: 0 minutes minutes 24 seconds (at 10 mGy). COMPLICATIONS: None. PROCEDURE: Through the existing Port-A-Cath access approximately 10 cc of Omnipaque 300 was administered. Frontal and oblique views obtained. Port-A-Cath is in widely patent. No evidence of fracture or displacement. No collagen sheath noted. IMPRESSION: Normal exam.  Widely patent Port-A-Cath. Electronically Signed   By: Marcello Moores  Register   On: 08/05/2019 12:14    Assessment and plan-  Patient is a 53 y.o. female with history of cervical cancer/lung metastasis, chronic diarrhea, history of small bowel obstruction and other multiple medical problems presented emergency room due to abdominal pain, nausea vomiting. CT did not show acute findings in the abdomen.    #Neutropenia, most recent chemotherapy Alimta 1 week ago.  Chemotherapy-induced. ANC was 0.4 yesterday.  Total white count 1. Today CBC showed total white count further declined 2.8. Patient has been started on Granix yesterday.  Continue daily. Given that she may have prolonged neutropenia, I will start patient on prophylactic antibiotics with Levaquin   #Nausea and vomiting, agree with supportive care.  IV fluid, antiemetics. Not much symptom relief from Zofran every 6 hours. Recommend to add Phenergan 25 mg every 6 hours as needed. If still no improvement, recommend adding low-dose Ativan  #Acute on chronic abdominal pain, likely neoplasm related.  Continue pain regimen with oxycodone 10 mg every 6 hours..  OxyContin 15 mg every 12 hours. Appreciate palliative input.  #Transaminitis likely secondary to recent chemotherapy.  Continue to  monitor.  Trending down today. #History of thrombosis, continue Xarelto. Thrombocytopenia, platelet trended down to 90s.  Will hold Xarelto if platelet drops below 50,000.  #Adrenal insufficiency, hydrocortisone 20 mg daily at baseline.  I started her on stress dose Solu-Cortef 60 mg for a few days.  Patient may go back to baseline dose of hydrocortisone 20 mg after symptoms improves  #Cervical cancer with lung metastasis.  Poor prognosis.  Patient has very poor tolerance for chemotherapy due to multiple medical problems.  Continue supportive care.  Goal of care was discussed with patient.   Palliative care on board. . Plan was discussed with Dr. Stark Jock, palliative care, RN via secure chat Thank you for allowing me to participate in the care of this patient.   Earlie Server, MD, PhD Hematology Oncology [facility] at [facility] Pager- [phone] 08/14/2019

## 2019-08-14 NOTE — Progress Notes (Signed)
   08/14/19 1300  Clinical Encounter Type  Visited With Patient  Visit Type Initial  Referral From Nurse  Spiritual Encounters  Spiritual Needs Prayer;Emotional  Stress Factors  Patient Stress Factors Health changes;Loss of control;Major life changes  Ch received an OR for prayer request. Pt made a decision to be DNR today and processed various emotions that came along with making this hard decision. Pt also processed with the ch how she felt about hearing about hospice for her future care plan by two different health care providers. Pt wants the quality of life and is discerning when in the future to stop the treatment and enjoy what is left of her life with much grace and poise. Pt is worried about her family especially her father who is struggling with depression. Pt worries that her elderly parents wouldn't be able to take care of her if her conditions worsen and showed relief in knowing that hospice service was available to her. Pt in the midst of all these challenging situations remained calm and strong. She wills to have positive influence on people around her. Pt requested prayer for herself and her family and ch prayed with her. Ch will follow up with her.

## 2019-08-15 LAB — BASIC METABOLIC PANEL
Anion gap: 12 (ref 5–15)
BUN: 6 mg/dL (ref 6–20)
CO2: 24 mmol/L (ref 22–32)
Calcium: 9.2 mg/dL (ref 8.9–10.3)
Chloride: 100 mmol/L (ref 98–111)
Creatinine, Ser: 0.59 mg/dL (ref 0.44–1.00)
GFR calc Af Amer: >60 mL/min (ref >60)
GFR calc non Af Amer: >60 mL/min (ref >60)
Glucose, Bld: 70 mg/dL (ref 70–99)
Potassium: 4 mmol/L (ref 3.5–5.1)
Sodium: 136 mmol/L (ref 135–145)

## 2019-08-15 LAB — CBC WITH DIFFERENTIAL/PLATELET
Abs Immature Granulocytes: 0 10*3/uL (ref 0.00–0.07)
Basophils Absolute: 0 10*3/uL (ref 0.0–0.1)
Basophils Relative: 0 %
Eosinophils Absolute: 0 10*3/uL (ref 0.0–0.5)
Eosinophils Relative: 0 %
HCT: 32.9 % — ABNORMAL LOW (ref 36.0–46.0)
Hemoglobin: 11.1 g/dL — ABNORMAL LOW (ref 12.0–15.0)
Lymphocytes Relative: 75 %
Lymphs Abs: 1.1 10*3/uL (ref 0.7–4.0)
MCH: 33 pg (ref 26.0–34.0)
MCHC: 33.7 g/dL (ref 30.0–36.0)
MCV: 97.9 fL (ref 80.0–100.0)
Monocytes Absolute: 0.3 10*3/uL (ref 0.1–1.0)
Monocytes Relative: 21 %
Neutro Abs: 0.1 10*3/uL — ABNORMAL LOW (ref 1.7–7.7)
Neutrophils Relative %: 4 %
Platelets: 70 10*3/uL — ABNORMAL LOW (ref 150–400)
RBC: 3.36 MIL/uL — ABNORMAL LOW (ref 3.87–5.11)
RDW: 13.6 % (ref 11.5–15.5)
Smear Review: NORMAL
WBC Morphology: ABNORMAL
WBC: 1.4 10*3/uL — CL (ref 4.0–10.5)
nRBC: 0 % (ref 0.0–0.2)

## 2019-08-15 LAB — MAGNESIUM: Magnesium: 1.6 mg/dL — ABNORMAL LOW (ref 1.7–2.4)

## 2019-08-15 MED ORDER — SODIUM CHLORIDE 0.9 % IV SOLN
INTRAVENOUS | Status: DC | PRN
  Administered 2019-08-15: 09:00:00 250 mL via INTRAVENOUS
  Administered 2019-08-16 – 2019-08-17 (×2): 500 mL via INTRAVENOUS

## 2019-08-15 MED ORDER — PANTOPRAZOLE SODIUM 40 MG PO TBEC
40.0000 mg | DELAYED_RELEASE_TABLET | Freq: Two times a day (BID) | ORAL | Status: DC
  Administered 2019-08-15 – 2019-08-19 (×8): 40 mg via ORAL
  Filled 2019-08-15 (×8): qty 1

## 2019-08-15 MED ORDER — MAGNESIUM SULFATE 2 GM/50ML IV SOLN
2.0000 g | Freq: Once | INTRAVENOUS | Status: AC
  Administered 2019-08-15: 09:00:00 2 g via INTRAVENOUS
  Filled 2019-08-15: qty 50

## 2019-08-15 MED ORDER — CHLORHEXIDINE GLUCONATE CLOTH 2 % EX PADS
6.0000 | MEDICATED_PAD | Freq: Every day | CUTANEOUS | Status: DC
  Administered 2019-08-15 – 2019-08-18 (×4): 6 via TOPICAL

## 2019-08-15 MED ORDER — ONDANSETRON HCL 4 MG/2ML IJ SOLN
4.0000 mg | Freq: Four times a day (QID) | INTRAMUSCULAR | Status: DC | PRN
  Administered 2019-08-15 – 2019-08-18 (×6): 4 mg via INTRAVENOUS
  Filled 2019-08-15 (×7): qty 2

## 2019-08-15 NOTE — Progress Notes (Signed)
[place] at [place] NAME: Joanna Hall    MR#:  [id number]  DATE OF BIRTH:  Oct 02, 1966  SUBJECTIVE:  CHIEF COMPLAINT:   Chief Complaint  Patient presents with  . Abdominal Pain  . Nausea   Patient with chronic abdominal pains.  Pain is better controlled this morning.  No fevers.  Patient had an episode of bloody stool yesterday.  Xarelto was placed on hold.  Repeat hemoglobin stable.  REVIEW OF SYSTEMS:  ROS Constitutional: Positive for malaise/fatigue. Negative for chills and fever.  HENT: Negative for sore throat.   Eyes: Negative for blurred vision and double vision.  Respiratory: Negative for cough, hemoptysis, shortness of breath, wheezing and stridor.   Cardiovascular: Negative for chest pain, palpitations, orthopnea and leg swelling.  Gastrointestinal: Abdominal pains and vomiting improved one episode of bloody stool yesterday but no recurrent bloody stools.  Genitourinary: Negative for dysuria, flank pain and hematuria.  Musculoskeletal: Negative for back pain and joint pain.  Skin: Negative for rash.  Neurological: Negative for dizziness, sensory change, focal weakness, seizures, loss of consciousness, weakness and headaches.  Endo/Heme/Allergies: Negative for polydipsia.  Psychiatric/Behavioral: Negative for depression. The patient is not nervous/anxious.   DRUG ALLERGIES:   Allergies  Allergen Reactions  . Ketamine Anxiety and Other (See Comments)    Syncope episode/confusion    VITALS:  Blood pressure (!) 130/94, pulse (!) 103, temperature 98 F (36.7 C), temperature source Oral, resp. rate 18, height 5' 7"  (1.702 m), weight 74.4 kg, SpO2 100 %. PHYSICAL EXAMINATION:  Physical Exam  GENERAL:  53 y.o.-year-old patient lying in the bed with no acute distress.  EYES: Pupils equal, round, reactive to light and accommodation. No scleral icterus. Extraocular muscles intact.  HEENT: Head atraumatic, normocephalic.  NECK:  Supple, no jugular venous distention. No thyroid enlargement, no tenderness.  LUNGS: Normal breath sounds bilaterally, no wheezing, rales,rhonchi or crepitation. No use of accessory muscles of respiration.  CARDIOVASCULAR: S1, S2 normal. No murmurs, rubs, or gallops.  ABDOMEN: Soft, diffuse tenderness, nondistended. Bowel sounds present. No organomegaly or mass.  EXTREMITIES: No pedal edema, cyanosis, or clubbing.  NEUROLOGIC: Cranial nerves II through XII are intact. Muscle strength 5/5 in all extremities. Sensation intact. Gait not checked.  PSYCHIATRIC: The patient is alert and oriented x 3.  SKIN: No obvious rash, lesion, or ulcer.   LABORATORY PANEL:  Female CBC Recent Labs  Lab 08/15/19 0601  WBC 1.4*  HGB 11.1*  HCT 32.9*  PLT 70*   ------------------------------------------------------------------------------------------------------------------ Chemistries  Recent Labs  Lab 08/14/19 0459 08/15/19 0601  NA 134* 136  K 4.0 4.0  CL 103 100  CO2 24 24  GLUCOSE 92 70  BUN 10 6  CREATININE 0.62 0.59  CALCIUM 8.8* 9.2  MG 1.7 1.6*  AST 48*  --   ALT 115*  --   ALKPHOS 170*  --   BILITOT 1.2  --    RADIOLOGY:  No results found. ASSESSMENT AND PLAN:    1.  Acute on chronic abdominal pain  Most likely related to neoplasm.   Seen by  palliative care team.  Adjusted pain meds and pains better controlled this morning.  Nausea and vomiting improving  2.  History of cervical cancer with lung mets.  Poor prognosis.  Patient with poor tolerance for chemotherapy.  Patient considering stopping further chemotherapy.   Palliative care team following patient..  For follow-up outpatient palliative care visits post discharge from the hospital.  3.  Neutropenia and neutropenia White blood cell count improved to 1.4 from 0.8.  Platelet count down to 70.  No bleeding.Chemotherapy-induced.  Patient started on Granix by oncologist. Given that patient may have prolonged  neutropenia, oncologist started patient on prophylactic antibiotics with levofloxacin.  4.  Hypomagnesemia  Replaced  5.  Transaminitis secondary to recent chemotherapy Monitor  6.  History of thrombosis and factor V Leyden mutation Patient on anticoagulation with Xarelto Oncologist recommends holding Xarelto if platelet count drops below 50,000. Xarelto however held yesterday due to an episode of bloody stools.  7.  Adrenal insufficiency. Patient on hydrocortisone 20 mg daily at baseline.  Started on stress dose steroids with Solu-Cortef by oncologist.  To go back to hydrocortisone 20 mg daily after symptoms improve  8.  GI bleed Patient had a bloody stool yesterday. Patient on Xarelto which was discontinued.  Repeat hemoglobin stable  Patient already on IV fluids.  Placed on IV Protonix. Given severe neutropenia , endoscopic evaluation will not be appropriate at this time.  Manage conservatively and transfuse if hemoglobin less than 7.  Hemoglobin stable at 11.1 this morning. To resume Xarelto in a.m. if hemoglobin remains stable and no recurrent bleed  DVT prophylaxis; patient on Xarelto  All the records are reviewed and case discussed with Care Management/Social Worker. Management plans discussed with the patient, family and they are in agreement. Anticipate discharge once neutropenia improves significantly  CODE STATUS: DNR  TOTAL TIME TAKING CARE OF THIS PATIENT: 32 minutes.   More than 50% of the time was spent in counseling/coordination of care: YES  POSSIBLE D/C IN 2 DAYS, DEPENDING ON CLINICAL CONDITION.   Bland Rudzinski M.D on 08/15/2019 at 12:16 PM  Between 7am to 6pm - Pager - [phone]  After 6pm go to [email or web address] - Proofreader  [facility] [place]  Office  [phone]  CC: Primary care physician; Delsa Grana, PA-C  Note: This dictation was prepared with Dragon dictation along with smaller phrase technology. Any  transcriptional errors that result from this process are unintentional.

## 2019-08-16 LAB — CBC WITH DIFFERENTIAL/PLATELET
Abs Immature Granulocytes: 0.03 10*3/uL (ref 0.00–0.07)
Basophils Absolute: 0 10*3/uL (ref 0.0–0.1)
Basophils Relative: 1 %
Eosinophils Absolute: 0 10*3/uL (ref 0.0–0.5)
Eosinophils Relative: 1 %
HCT: 32.6 % — ABNORMAL LOW (ref 36.0–46.0)
Hemoglobin: 10.9 g/dL — ABNORMAL LOW (ref 12.0–15.0)
Immature Granulocytes: 1 %
Lymphocytes Relative: 30 %
Lymphs Abs: 1.1 10*3/uL (ref 0.7–4.0)
MCH: 32.9 pg (ref 26.0–34.0)
MCHC: 33.4 g/dL (ref 30.0–36.0)
MCV: 98.5 fL (ref 80.0–100.0)
Monocytes Absolute: 0.5 10*3/uL (ref 0.1–1.0)
Monocytes Relative: 13 %
Neutro Abs: 1.9 10*3/uL (ref 1.7–7.7)
Neutrophils Relative %: 54 %
Platelets: 60 10*3/uL — ABNORMAL LOW (ref 150–400)
RBC: 3.31 MIL/uL — ABNORMAL LOW (ref 3.87–5.11)
RDW: 13.7 % (ref 11.5–15.5)
Smear Review: DECREASED
WBC: 3.5 10*3/uL — ABNORMAL LOW (ref 4.0–10.5)
nRBC: 0 % (ref 0.0–0.2)

## 2019-08-16 LAB — BASIC METABOLIC PANEL
Anion gap: 9 (ref 5–15)
BUN: 8 mg/dL (ref 6–20)
CO2: 28 mmol/L (ref 22–32)
Calcium: 9.1 mg/dL (ref 8.9–10.3)
Chloride: 100 mmol/L (ref 98–111)
Creatinine, Ser: 0.74 mg/dL (ref 0.44–1.00)
GFR calc Af Amer: >60 mL/min (ref >60)
GFR calc non Af Amer: >60 mL/min (ref >60)
Glucose, Bld: 91 mg/dL (ref 70–99)
Potassium: 3.2 mmol/L — ABNORMAL LOW (ref 3.5–5.1)
Sodium: 137 mmol/L (ref 135–145)

## 2019-08-16 LAB — MAGNESIUM: Magnesium: 1.5 mg/dL — ABNORMAL LOW (ref 1.7–2.4)

## 2019-08-16 MED ORDER — POTASSIUM CHLORIDE CRYS ER 20 MEQ PO TBCR
20.0000 meq | EXTENDED_RELEASE_TABLET | Freq: Once | ORAL | Status: AC
  Administered 2019-08-16: 14:00:00 20 meq via ORAL
  Filled 2019-08-16: qty 1

## 2019-08-16 MED ORDER — MAGNESIUM SULFATE 2 GM/50ML IV SOLN
2.0000 g | Freq: Once | INTRAVENOUS | Status: AC
  Administered 2019-08-16: 11:00:00 2 g via INTRAVENOUS
  Filled 2019-08-16: qty 50

## 2019-08-16 NOTE — Progress Notes (Signed)
[place] at [place] NAME: Joanna Hall    MR#:  [id number]  DATE OF BIRTH:  04-03-1966  SUBJECTIVE:  CHIEF COMPLAINT:   Chief Complaint  Patient presents with  . Abdominal Pain  . Nausea   Patient seen and evaluated today Decreased abdominal pain No new episodes of bloody stool No fevers and chills  REVIEW OF SYSTEMS:  ROS Constitutional: Positive for malaise/fatigue. Negative for chills and fever.  HENT: Negative for sore throat.   Eyes: Negative for blurred vision and double vision.  Respiratory: Negative for cough, hemoptysis, shortness of breath, wheezing and stridor.  Cardiovascular: Negative for chest pain, palpitations, orthopnea and leg swelling.  Gastrointestinal: Abdominal pains and vomiting improved one episode of bloody stool yesterday but no recurrent bloody stools.  Genitourinary: Negative for dysuria, flank pain and hematuria.  Musculoskeletal: Negative for back pain and joint pain.  Skin: Negative for rash.  Neurological: Negative for dizziness, sensory change, focal weakness, seizures, loss of consciousness, weakness and headaches.  Endo/Heme/Allergies: Negative for polydipsia.  Psychiatric/Behavioral: Negative for depression. The patient is not nervous/anxious.   DRUG ALLERGIES:   Allergies  Allergen Reactions  . Ketamine Anxiety and Other (See Comments)    Syncope episode/confusion    VITALS:  Blood pressure (!) 115/95, pulse 100, temperature 98.4 F (36.9 C), temperature source Oral, resp. rate 18, height 5' 7"  (1.702 m), weight 74.4 kg, SpO2 98 %. PHYSICAL EXAMINATION:  Physical Exam  GENERAL:  53 y.o.-year-old patient lying in the bed with no acute distress.  EYES: Pupils equal, round, reactive to light and accommodation. No scleral icterus. Extraocular muscles intact.  HEENT: Head atraumatic, normocephalic. NECK:  Supple, no jugular venous distention. No thyroid enlargement, no tenderness.   LUNGS: Normal breath sounds bilaterally, no wheezing, rales,rhonchi or crepitation. No use of accessory muscles of respiration.  CARDIOVASCULAR: S1, S2 normal. No murmurs, rubs, or gallops.  ABDOMEN: Soft, diffuse tenderness, nondistended. Bowel sounds present. No organomegaly or mass.  EXTREMITIES: No pedal edema, cyanosis, or clubbing.  NEUROLOGIC: Cranial nerves II through XII are intact. Muscle strength 5/5 in all extremities. Sensation intact. Gait not checked.  PSYCHIATRIC: The patient is alert and oriented x 3.  SKIN: No obvious rash, lesion, or ulcer.   LABORATORY PANEL:  Female CBC Recent Labs  Lab 08/16/19 0609  WBC 3.5*  HGB 10.9*  HCT 32.6*  PLT 60*   ------------------------------------------------------------------------------------------------------------------ Chemistries  Recent Labs  Lab 08/14/19 0459  08/16/19 0609  NA 134*   < > 137  K 4.0   < > 3.2*  CL 103   < > 100  CO2 24   < > 28  GLUCOSE 92   < > 91  BUN 10   < > 8  CREATININE 0.62   < > 0.74  CALCIUM 8.8*   < > 9.1  MG 1.7   < > 1.5*  AST 48*  --   --   ALT 115*  --   --   ALKPHOS 170*  --   --   BILITOT 1.2  --   --    < > = values in this interval not displayed.   RADIOLOGY:  No results found. ASSESSMENT AND PLAN:  53 year old female patient with history of cervical cancer with lung metastasis, hepatitis C, GI bleed, bowel obstruction in the past, factor V Leyden mutation on Xarelto for anticoagulation as outpatient currently under hospitalist service  1.  Acute on chronic abdominal pain  Most likely related to neoplasm.   Seen by  palliative care team.  Adjusted pain meds and pains better controlled this morning.  Nausea and vomiting improving  2.  History of cervical cancer with lung mets.  Poor prognosis.  Patient with poor tolerance for chemotherapy.  Patient considering stopping further chemotherapy.   Palliative care team following patient..  For follow-up outpatient palliative care  visits post discharge from the hospital.  3.  Neutropenia  White blood cell count improved to 3.4 from 0.8.  Platelet count down to 70.  No bleeding.Chemotherapy-induced.  Patient started on Granix by oncologist. Given that patient may have prolonged neutropenia, oncologist started patient on prophylactic antibiotics with levofloxacin.  Patient tolerating antibiotics well  4.  Hypomagnesemia  Replace intravenously  5.  Transaminitis secondary to recent chemotherapy Monitor  6.  History of thrombosis and factor V Leyden mutation Patient on anticoagulation with Xarelto Oncologist recommends holding Xarelto if platelet count drops below 50,000. Xarelto however held yesterday due to an episode of bloody stools.  7.  Adrenal insufficiency. Patient on hydrocortisone 20 mg daily at baseline.  Started on stress dose steroids with Solu-Cortef by oncologist.  To go back to hydrocortisone 20 mg daily after symptoms improve  8.  GI bleed Patient had a bloody stool yesterday. Patient on Xarelto which was discontinued.  Repeat hemoglobin stable  Patient already on IV fluids.  Placed on IV Protonix. Given severe neutropenia , endoscopic evaluation will not be appropriate at this time.  Manage conservatively and transfuse if hemoglobin less than 7.  Hemoglobin stable at 10.9 this morning. Xarelto will be resumed and hemoglobin was monitored  9.  Acute hypokalemia Replace potassium  DVT prophylaxis; patient on Xarelto  All the records are reviewed and case discussed with Care Management/Social Worker. Management plans discussed with the patient, family and they are in agreement. Anticipate discharge once neutropenia improves significantly and no drop in hemoglobin  CODE STATUS: DNR  TOTAL TIME TAKING CARE OF THIS PATIENT: 35 minutes.   More than 50% of the time was spent in counseling/coordination of care: YES  POSSIBLE D/C IN 2 DAYS, DEPENDING ON CLINICAL CONDITION.   Saundra Shelling M.D  on 08/16/2019 at 11:50 AM  Between 7am to 6pm - Pager - [phone]  After 6pm go to [email or web address] - Proofreader  [facility]  Office  [phone]  CC: Primary care physician; Delsa Grana, PA-C  Note: This dictation was prepared with Dragon dictation along with smaller phrase technology. Any transcriptional errors that result from this process are unintentional.

## 2019-08-17 ENCOUNTER — Other Ambulatory Visit: Payer: Self-pay | Admitting: Internal Medicine

## 2019-08-17 DIAGNOSIS — D6851 Activated protein C resistance: Secondary | ICD-10-CM

## 2019-08-17 DIAGNOSIS — R1030 Lower abdominal pain, unspecified: Secondary | ICD-10-CM

## 2019-08-17 DIAGNOSIS — D696 Thrombocytopenia, unspecified: Secondary | ICD-10-CM

## 2019-08-17 LAB — BASIC METABOLIC PANEL
Anion gap: 8 (ref 5–15)
BUN: 11 mg/dL (ref 6–20)
CO2: 29 mmol/L (ref 22–32)
Calcium: 8.9 mg/dL (ref 8.9–10.3)
Chloride: 100 mmol/L (ref 98–111)
Creatinine, Ser: 0.72 mg/dL (ref 0.44–1.00)
GFR calc Af Amer: >60 mL/min (ref >60)
GFR calc non Af Amer: >60 mL/min (ref >60)
Glucose, Bld: 83 mg/dL (ref 70–99)
Potassium: 4 mmol/L (ref 3.5–5.1)
Sodium: 137 mmol/L (ref 135–145)

## 2019-08-17 LAB — CBC
HCT: 32.1 % — ABNORMAL LOW (ref 36.0–46.0)
Hemoglobin: 10.5 g/dL — ABNORMAL LOW (ref 12.0–15.0)
MCH: 32.6 pg (ref 26.0–34.0)
MCHC: 32.7 g/dL (ref 30.0–36.0)
MCV: 99.7 fL (ref 80.0–100.0)
Platelets: 59 10*3/uL — ABNORMAL LOW (ref 150–400)
RBC: 3.22 MIL/uL — ABNORMAL LOW (ref 3.87–5.11)
RDW: 13.8 % (ref 11.5–15.5)
WBC: 5.3 10*3/uL (ref 4.0–10.5)
nRBC: 0 % (ref 0.0–0.2)

## 2019-08-17 LAB — MAGNESIUM: Magnesium: 1.7 mg/dL (ref 1.7–2.4)

## 2019-08-17 MED ORDER — MAGNESIUM SULFATE 2 GM/50ML IV SOLN
2.0000 g | Freq: Once | INTRAVENOUS | Status: AC
  Administered 2019-08-17: 2 g via INTRAVENOUS
  Filled 2019-08-17: qty 50

## 2019-08-17 MED ORDER — HYDROCORTISONE 10 MG PO TABS
20.0000 mg | ORAL_TABLET | Freq: Every day | ORAL | Status: DC
  Administered 2019-08-17 – 2019-08-19 (×3): 20 mg via ORAL
  Filled 2019-08-17 (×3): qty 2

## 2019-08-17 MED ORDER — RIVAROXABAN 20 MG PO TABS
20.0000 mg | ORAL_TABLET | Freq: Every day | ORAL | Status: DC
  Administered 2019-08-17 – 2019-08-18 (×2): 20 mg via ORAL
  Filled 2019-08-17 (×3): qty 1

## 2019-08-17 NOTE — Progress Notes (Addendum)
PROGRESS NOTE  Joanna Hall KXF:[id number] DOB: 18-Feb-1966 DOA: 08/13/2019 PCP: Delsa Grana, PA-C has scheduled follow-up visit in the cancer center on 11/12 to see Dr. Rogue Bussing for consideration of future treatment  Brief History   53 year old woman PMH cervical cancer with lung metastasis, hepatitis C presented with abdominal pain, nausea, vomiting.  Admitted for abdominal pain, neutropenia.  Treated with supportive care, antiemetics, analgesics.  Seen by oncology for neutropenia and treated with Granix with improvement.  Seen by palliative medicine, made DNR.  10/29 had an episode of dark bloody stools and anticoagulation was briefly held.  No endoscopic evaluation was contemplated given neutropenia.  A & P  Abdominal on chronic pain with nausea and vomiting thought secondary to neoplasm.  No acute findings by CT abdomen pelvis. --Pain appears to be chronic, nausea as well.  Tolerating lunch today. --Continue oxycodone, OxyContin. --Continue Zofran, Phenergan as needed.  If no improvement consider low-dose Ativan.  GI bleed --Appears to have been 1 self-limited episode, no recurrence noted.  Xarelto has been on hold. --Continue Protonix.  Resume her Xarelto and follow closely.  Will send a message to her GI doctor to make aware of admission.  Neutropenia secondary to chemotherapy --Resolved.  Treated with Granix.  Started on prophylactic Levaquin by oncologist.  Transaminitis likely secondary to recent chemotherapy. --Trending down.  Cervical cancer with lung metastasis, currently undergoing chemotherapy.  Poor prognosis. --Follow-up as an outpatient  Thrombocytopenia secondary to antineoplastic therapy --Appears to be stabilizing.  Follow clinically.  Chronic adrenal insufficiency on hydrocortisone 20 mg daily. --Resume hydrocortisone 20 mg daily.  PMH factor V Leiden mutation, renal artery stenosis, lung thrombosis, DVT --Hold Xarelto if platelet count drops below 50   Chronic diarrhea  . Overall appears to be improving.  If tolerates food today anticipate discharge 11/2.  DVT prophylaxis: Xarelto Code Status: DNR Family Communication: none Disposition Plan: home    Murray Hodgkins, MD  [facility] Direct contact: see [email or web address] (further directions at bottom of note if needed) 7PM-7AM contact night coverage as at bottom of note 08/17/2019, 12:47 PM  LOS: 4 days   Significant Hospital Events   . 10/28 admitted for abdominal pain . 10/28 oncology consultation . 10/29 palliative medicine consultation   Consults:  . Oncology . Palliative medicine   Procedures:  .   Significant Diagnostic Tests:  . Covid negative . CT abdomen pelvis numerous bilateral lower lobe cavitary nodule stable since prior study.  Stable intra and extrahepatic biliary ductal dilatation likely related to postcholecystectomy state.  Aortic atherosclerosis.  No acute findings in abdomen or pelvis.   Micro Data:  . Urine culture multiple species present.   Antimicrobials:  .   Interval History/Subjective  Feeling some better.  Still has nausea and abdominal pain but was able to tolerate breakfast.  No bleeding noted.  Objective   Vitals:  Vitals:   08/17/19 0507 08/17/19 0953  BP: 110/75 134/88  Pulse: (!) 101 (!) 107  Resp: 18 18  Temp: 98.2 F (36.8 C) 98.8 F (37.1 C)  SpO2: 98% 99%    Exam:  Constitutional: Appears calm, comfortable. Cardiovascular: Regular rate and rhythm.  No murmur, rub or gallop.  No lower extremity edema. Respiratory: Clear to auscultation bilaterally.  No wheezes, rales rhonchi.  Normal respiratory effort. Abdomen: Soft, nondistended, mild generalized tenderness. Psychiatric: Grossly normal mood and affect.  Speech fluent and appropriate.  I have personally reviewed the following:   Today's Data  . Basic metabolic panel unremarkable .  Hemoglobin stable at 10.5.  Platelets stable at 59.  WBC 5.3.  Scheduled Meds:  . amitriptyline  75 mg Oral QHS  . calcium-vitamin D  2 tablet Oral Daily  . Chlorhexidine Gluconate Cloth  6 each Topical Daily  . colchicine  0.6 mg Oral BID  . folic acid  1 mg Oral Daily  . hyoscyamine  0.375 mg Oral Q12H  . levofloxacin  500 mg Oral Daily  . oxyCODONE  15 mg Oral Q8H  . pantoprazole  40 mg Oral BID  . rivaroxaban  20 mg Oral Q supper   Continuous Infusions: . sodium chloride Stopped (08/16/19 1332)    Principal Problem:   Intractable lower abdominal pain Active Problems:   Factor V Leiden (HCC)   Abdominal pain   Thrombocytopenia (HCC)   LOS: 4 days   How to contact the [facility] Attending or Consulting provider [place] or covering provider during after hours [place], for this patient?  1. Check the care team in [facility] and look for a) attending/consulting TRH provider listed and b) the [facility] team listed 2. Log into [email or web address] and use [place]'s universal password to access. If you do not have the password, please contact the hospital operator. 3. Locate the [facility] provider you are looking for under [facility] and page to a number that you can be directly reached. 4. If you still have difficulty reaching the provider, please page the [facility] (Director on Call) for the Hospitalists listed on amion for assistance.

## 2019-08-17 NOTE — TOC Initial Note (Signed)
Transition of Care [facility]) - Initial/Assessment Note    Patient Details  Name: Joanna Hall MRN: [id number] Date of Birth: 06-01-1966  [facility] CM/SW Contact:    Katrina Stack, RN Phone Number: 08/17/2019, 5:54 PM  Clinical Narrative:              CM was going to assess patient due to high readm score.  There is no consult. Time did not allow this CM to assess today           Patient Goals and CMS Choice        Expected Discharge Plan and Services                                                Prior Living Arrangements/Services                       Activities of Daily Living Home Assistive Devices/Equipment: None ADL Screening (condition at time of admission) Patient's cognitive ability adequate to safely complete daily activities?: Yes Is the patient deaf or have difficulty hearing?: No Does the patient have difficulty seeing, even when wearing glasses/contacts?: No Does the patient have difficulty concentrating, remembering, or making decisions?: No Patient able to express need for assistance with ADLs?: Yes Does the patient have difficulty dressing or bathing?: No Independently performs ADLs?: Yes (appropriate for developmental age) Does the patient have difficulty walking or climbing stairs?: Yes Weakness of Legs: Both Weakness of Arms/Hands: Both  Permission Sought/Granted                  Emotional Assessment              Admission diagnosis:  Cytopenia [D75.9] Chemotherapy-induced neutropenia (HCC) [D70.1, T45.1X5A] Malignant neoplasm metastatic to lung, unspecified laterality ([place]) [C78.00] Intractable lower abdominal pain [R10.30] Patient Active Problem List   Diagnosis Date Noted  . Thrombocytopenia ([place]) 08/17/2019  . Intractable lower abdominal pain   . Abdominal pain 07/23/2019  . Constipation 07/22/2019  . Malnutrition of moderate degree 06/22/2019  . Small bowel obstruction due to  adhesions ([place]) 06/21/2019  . Acute gastroenteritis 06/14/2019  . Intractable nausea and vomiting 06/07/2019  . Chest pain in adult 05/19/2019  . Intractable pain 05/19/2019  . Cancer associated pain   . Proctitis, radiation   . Palliative care encounter   . Encounter for monitoring opioid maintenance therapy 11/19/2018  . Adrenal insufficiency ([place]) 09/03/2018  . Chronic diarrhea 06/25/2018  . Chronic anticoagulation 06/25/2018  . Vomiting   . Encounter for antineoplastic chemotherapy 06/04/2018  . 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 Lung metastasis ([place]) 05/15/2018  . Closed fracture of distal end of radius 05/04/2018  . History of total knee arthroplasty 04/24/2018  . Osteoarthritis of left knee 02/28/2018  . Condyloma acuminatum of anus s/p ablation 02/22/2018 02/22/2018  . Condyloma acuminatum of vagina s/p ablation 02/22/2018 02/22/2018  . Positive ANA (antinuclear antibody) 02/04/2018  . Aortic atherosclerosis ([place]) 01/15/2018  . Elevated MCV 01/15/2018  . Anemia 01/15/2018  . Genital warts 01/15/2018  . Vitamin D deficiency 01/15/2018  . Pernicious anemia   . Impingement syndrome of shoulder region 12/19/2017  . Multiple joint pain 12/19/2017  . Osteoarthritis of right knee 12/19/2017  . B12 deficiency 12/11/2017  . Lung nodules 12/11/2017  . History of Clostridium difficile infection  10/23/2017  . Factor V Leiden ([place]) 10/19/2017  . Goals of care, counseling/discussion 10/19/2017  . Cervical cancer, FIGO stage IB1 ([place]) 09/18/2017  . Chronic hepatitis C without hepatic coma ([place]) 09/18/2017  . Cytopenia 09/18/2017  . Diarrhea 09/18/2017  . Lower abdominal pain 09/18/2017  . 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 Malignant neoplasm of overlapping sites of cervix ([place]) 09/18/2017  . Renal insufficiency 09/18/2017  . Wound infection after surgery 09/12/2017  . Hypokalemia   . Hypomagnesemia   . Cervical arthritis 07/18/2017  . Dysuria 06/20/2017  . Metastatic  cancer ([place]) 05/12/2017  . Essential hypertension 03/15/2017  . Anemia in other chronic diseases classified elsewhere 03/01/2017  . Chemotherapy-induced neutropenia ([place]) 01/29/2017  . 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[place]) 09/25/2016   PCP:  Delsa Grana, PA-C Pharmacy:   [facility], [place] [address] Phone: [phone] Fax: [phone]  [facility] [address] [place]) [place] [address] Phone: [phone] Fax: [phone]     Social Determinants of Health (SDOH) Interventions    Readmission Risk Interventions Readmission Risk Prevention Plan 07/24/2019 06/25/2019 06/24/2019  Transportation Screening Complete Complete Complete  Medication Review (RN Care Manager) Referral to Pharmacy Complete Complete  PCP or Specialist appointment within 3-5 days of discharge Complete (No Data) -  [place] or Home Care Consult Complete - -  SW Recovery Care/Counseling Consult Complete - -  Palliative Care Screening Not Applicable Not Applicable -  [place] Not Applicable Not Applicable Not Applicable  Some recent data might be hidden

## 2019-08-18 ENCOUNTER — Encounter: Payer: Self-pay | Admitting: Internal Medicine

## 2019-08-18 LAB — CBC
HCT: 33.1 % — ABNORMAL LOW (ref 36.0–46.0)
Hemoglobin: 11 g/dL — ABNORMAL LOW (ref 12.0–15.0)
MCH: 32.8 pg (ref 26.0–34.0)
MCHC: 33.2 g/dL (ref 30.0–36.0)
MCV: 98.8 fL (ref 80.0–100.0)
Platelets: 65 10*3/uL — ABNORMAL LOW (ref 150–400)
RBC: 3.35 MIL/uL — ABNORMAL LOW (ref 3.87–5.11)
RDW: 14 % (ref 11.5–15.5)
WBC: 4.4 10*3/uL (ref 4.0–10.5)
nRBC: 0 % (ref 0.0–0.2)

## 2019-08-18 NOTE — Progress Notes (Signed)
Pt up in room. Denies pain or discomfort at this time.

## 2019-08-18 NOTE — Progress Notes (Signed)
PROGRESS NOTE  Joanna Hall XYI:[id number] DOB: Mar 31, 1966 DOA: 08/13/2019 PCP: Delsa Grana, PA-C has scheduled follow-up visit in the cancer center on 11/12 to see Dr. Rogue Bussing for consideration of future treatment  Brief History   53 year old woman PMH cervical cancer with lung metastasis, hepatitis C presented with abdominal pain, nausea, vomiting.  Admitted for abdominal pain, neutropenia.  Treated with supportive care, antiemetics, analgesics.  Seen by oncology for neutropenia and treated with Granix with improvement.  Seen by palliative medicine, made DNR.  10/29 had an episode of dark bloody stools and anticoagulation was briefly held.  No endoscopic evaluation was contemplated given neutropenia.  A & P  Abdominal on chronic pain with nausea and vomiting thought secondary to neoplasm.  No acute findings by CT abdomen pelvis. --Appears to be stabilizing with chronic pain and nausea.  Continue oxycodone, OxyContin, Zofran, Phenergan as needed.  GI bleed --She has had some spotting overnight or rather this morning when she wiped after going to the bathroom.  She is also noted a little bit of blood on her pad.  External genital exam and rectal exam unremarkable.  There are some raw areas perianal that may be the source of bleeding.  Hemoglobin remained stable.  Continue Xarelto and monitor.   --Continue Protonix.    Neutropenia secondary to chemotherapy --Resolved.  Treated with Granix.  Stop Levaquin.  Transaminitis likely secondary to recent chemotherapy. --Trending down.  No further evaluation suggested.  Cervical cancer with lung metastasis, currently undergoing chemotherapy.  Poor prognosis. --Follow-up as an outpatient  Thrombocytopenia secondary to antineoplastic therapy --Now appears stable.  No further evaluation suggested.  Chronic adrenal insufficiency on hydrocortisone 20 mg daily. --Continue hydrocortisone 20 mg daily.  PMH factor V Leiden mutation, renal  artery stenosis, lung thrombosis, DVT --Hold Xarelto if platelet count drops below 50  Chronic diarrhea  . Overall improved.  No evidence of significant bleeding.  She does have a history of hemorrhoids on flexible sigmoidoscopy in the past.  Will monitor clinically and discharged home tomorrow if no significant bleeding.  DVT prophylaxis: Xarelto Code Status: DNR Family Communication: none Disposition Plan: home    Murray Hodgkins, MD  [facility] Direct contact: see [email or web address] (further directions at bottom of note if needed) 7PM-7AM contact night coverage as at bottom of note 08/18/2019, 5:08 PM  LOS: 5 days   Significant Hospital Events   . 10/28 admitted for abdominal pain . 10/28 oncology consultation . 10/29 palliative medicine consultation   Consults:  . Oncology . Palliative medicine   Procedures:  .   Significant Diagnostic Tests:  . Covid negative . CT abdomen pelvis numerous bilateral lower lobe cavitary nodule stable since prior study.  Stable intra and extrahepatic biliary ductal dilatation likely related to postcholecystectomy state.  Aortic atherosclerosis.  No acute findings in abdomen or pelvis.   Micro Data:  . Urine culture multiple species present.   Antimicrobials:  .   Interval History/Subjective  Lower abdominal pain.  Reports some spotting on her pad and when wiping after going to the bathroom.  Objective   Vitals:  Vitals:   08/18/19 0850 08/18/19 1454  BP: 130/90 137/83  Pulse: 90 99  Resp: 18 16  Temp: 98.5 F (36.9 C) 98.3 F (36.8 C)  SpO2: 97% 100%    Exam:  Constitutional.  Appears calm, comfortable. Respiratory.  Clear to auscultation bilaterally.  No wheezes, rales or rhonchi.  Normal respiratory effort. Cardiovascular.  Regular rate and rhythm.  No murmur, rub  or gallop.  No lower extremity edema. Abdomen.  Soft, nondistended, some lower abdominal pain with palpation GU.  Conducted with bedside Acupuncturist as  chaperone.  External genitalia appears unremarkable.  There is no blood noted.  Anus appears unremarkable.  Rectal exam unremarkable.  There is no blood in the rectal vault.  There are however 2 raw areas perianal. Psychiatric: Grossly normal mood and affect.  Speech fluent and appropriate.  I have personally reviewed the following:   Today's Data  . Hemoglobin stable 11.0.  Platelets stable at 60.  Scheduled Meds: . amitriptyline  75 mg Oral QHS  . calcium-vitamin D  2 tablet Oral Daily  . Chlorhexidine Gluconate Cloth  6 each Topical Daily  . colchicine  0.6 mg Oral BID  . folic acid  1 mg Oral Daily  . hydrocortisone  20 mg Oral Daily  . hyoscyamine  0.375 mg Oral Q12H  . levofloxacin  500 mg Oral Daily  . oxyCODONE  15 mg Oral Q8H  . pantoprazole  40 mg Oral BID  . rivaroxaban  20 mg Oral Q supper   Continuous Infusions: . sodium chloride 500 mL (08/17/19 1445)    Principal Problem:   Intractable lower abdominal pain Active Problems:   Factor V Leiden (HCC)   Abdominal pain   Thrombocytopenia ([place])   LOS: 5 days   How to contact the [facility] Attending or Consulting provider [place] or covering provider during after hours [place], for this patient?  1. Check the care team in [facility] and look for a) attending/consulting [facility] provider listed and b) the [facility] team listed 2. Log into [email or web address] and use [place]'s universal password to access. If you do not have the password, please contact the hospital operator. 3. Locate the [facility] provider you are looking for under [facility] and page to a number that you can be directly reached. 4. If you still have difficulty reaching the provider, please page the [facility] (Director on Call) for the Hospitalists listed on amion for assistance.

## 2019-08-19 ENCOUNTER — Inpatient Hospital Stay: Payer: Medicaid Other

## 2019-08-19 MED ORDER — HEPARIN SOD (PORK) LOCK FLUSH 100 UNIT/ML IV SOLN
500.0000 [IU] | Freq: Once | INTRAVENOUS | Status: AC
  Administered 2019-08-19: 12:00:00 500 [IU] via INTRAVENOUS
  Filled 2019-08-19: qty 5

## 2019-08-19 NOTE — Discharge Summary (Signed)
Physician Discharge Summary  MICHEAL SHEEN DXA:[id number] DOB: 06-30-1966 DOA: 08/13/2019  PCP: Delsa Grana, PA-C  Admit date: 08/13/2019 Discharge date: 08/19/2019  Recommendations for Outpatient Follow-up:  1. Ongoing care for cervical cancer with complications of abdominal pain and thrombocytopenia  Follow-up Information    Delsa Grana, PA-C. Go on 08/21/2019.   Specialty: Family Medicine Why: @2 :40 PM  Contact information: [address] [place] [address] [phone]            Discharge Diagnoses: Principal diagnosis is #1 1. Abdominal on chronic pain with nausea and vomiting thought secondary to neoplasm. 2. Neutropenia secondary to chemotherapy 3. Transaminitis likely secondary to recent chemotherapy. 4. Cervical cancer with lung metastasis, currently undergoing chemotherapy 5. Thrombocytopenia 6. Chronic adrenal insufficiency 7. PMH factor V Leiden mutation, renal artery stenosis, lung thrombosis, DVT   Discharge Condition: improved Disposition: home  Diet recommendation: general  Filed Weights   08/13/19 0846  Weight: 74.4 kg    History of present illness:  53 year old woman PMH cervical cancer with lung metastasis, hepatitis C presented with abdominal pain, nausea, vomiting.  Admitted for abdominal pain, neutropenia.    Hospital Course:  Treated with supportive care, antiemetics, analgesics.  Seen by oncology for neutropenia and treated with Granix with improvement.  Seen by palliative medicine, made DNR.  10/29 had an episode of dark bloody stools and anticoagulation was briefly held.  No endoscopic evaluation was contemplated given neutropenia.  Condition slowly improved, had 1 small episode of bleeding which is probably related to raw skin areas.  No evidence of significant bleeding.  Condition continues to improve and hospitalization was otherwise uncomplicated.  Individual issues as below  Abdominal on chronic pain with  nausea and vomiting thought secondary to neoplasm.  No acute findings by CT abdomen pelvis. --Stable.  Continue oxycodone, OxyContin, Zofran, Phenergan as needed.  GI bleed? --No recurrent bleeding noted.  Previous exam was notable for some raw areas of skin perianal.  External genitalia and rectal exam was unremarkable.  No further evaluation suggested. --Continue Protonix.    Neutropenia secondary to chemotherapy --Resolved.  Treated with Granix.    Transaminitis likely secondary to recent chemotherapy. --Trending down.  No further evaluation suggested.  Cervical cancer with lung metastasis, currently undergoing chemotherapy.  Poor prognosis. --Follow-up as an outpatient  Thrombocytopenia secondary to antineoplastic therapy --Now appears stable.  No further evaluation suggested.  Chronic adrenal insufficiency on hydrocortisone 20 mg daily. --Continue hydrocortisone 20 mg daily.  PMH factor V Leiden mutation, renal artery stenosis, lung thrombosis, DVT  Chronic diarrhea  Today's assessment: S: Feels well, no bleeding.  No complaints.  Chronic nausea stable.  Tolerating diet O: Vitals:  Vitals:   08/19/19 0800 08/19/19 0900  BP: (!) 120/91 (!) 136/93  Pulse: 92 (!) 111  Resp: 20 18  Temp: 98.1 F (36.7 C) 98 F (36.7 C)  SpO2: 100% 100%    Constitutional:  . Appears calm and comfortable Respiratory:  . CTA bilaterally, no w/r/r.  . Respiratory effort normal.  Cardiovascular:  . RRR, no m/r/g . No LE extremity edema   Abdomen:  . Soft Psychiatric:  . judgement and insight appear normal . Mental status o Mood, affect appropriate  No new labs.  Discharge Instructions  Discharge Instructions    Activity as tolerated - No restrictions   Complete by: As directed    Diet general   Complete by: As directed    Discharge instructions   Complete by: As directed  Call your physician or seek immediate medical attention for worsening of abdominal pain,  vomiting, bleeding or worsening of condition.     Allergies as of 08/19/2019      Reactions   Ketamine Anxiety, Other (See Comments)   Syncope episode/confusion      Medication List    TAKE these medications   amitriptyline 75 MG tablet Commonly known as: ELAVIL Take 1 tablet (75 mg total) by mouth at bedtime.   Calcium 500 +D 500-400 MG-UNIT Tabs Generic drug: Calcium Carb-Cholecalciferol Take 2 tablets by mouth daily.   colchicine 0.6 MG tablet Take 0.6 mg by mouth 2 (two) times daily.   dexamethasone 4 MG tablet Commonly known as: DECADRON Take one pill AM & PM x 3 days; start the day prior to chemo.   diphenoxylate-atropine 2.5-0.025 MG tablet Commonly known as: LOMOTIL TAKE (2) TABLETS FOUR TIMES DAILY.   famotidine 20 MG tablet Commonly known as: PEPCID Take 1 tablet (20 mg total) by mouth 2 (two) times daily for 5 days. What changed:   when to take this  reasons to take this   folic acid 1 MG tablet Commonly known as: FOLVITE Take 1 tablet (1 mg total) by mouth daily.   hydrocortisone 20 MG tablet Commonly known as: CORTEF Take 1 tablet (20 mg total) by mouth daily.   hydrOXYzine 10 MG tablet Commonly known as: ATARAX/VISTARIL Take 1 tablet (10 mg total) by mouth 3 (three) times daily as needed.   hyoscyamine 0.375 MG 12 hr tablet Commonly known as: LEVBID Take 1 tablet (0.375 mg total) by mouth 2 (two) times daily.   ondansetron 4 MG disintegrating tablet Commonly known as: Zofran ODT Take 1 tablet (4 mg total) by mouth every 8 (eight) hours as needed for nausea or vomiting.   oxyCODONE 15 mg 12 hr tablet Commonly known as: OXYCONTIN Take 1 tablet (15 mg total) by mouth every 12 (twelve) hours.   Oxycodone HCl 10 MG Tabs Take 1 tablet (10 mg total) by mouth every 6 (six) hours as needed (for breakthrough pain).   pantoprazole 40 MG tablet Commonly known as: PROTONIX Take 1 tablet (40 mg total) by mouth daily.   potassium chloride SA 20 MEQ  tablet Commonly known as: KLOR-CON Take 1 tablet (20 mEq total) by mouth daily as needed (if having nausea/vomiting- to supplements for lost potassium).   promethazine 25 MG tablet Commonly known as: PHENERGAN Take 1 tablet (25 mg total) by mouth every 8 (eight) hours as needed for nausea or vomiting.   rivaroxaban 20 MG Tabs tablet Commonly known as: XARELTO Take 1 tablet (20 mg total) by mouth daily with supper. What changed: additional instructions   traMADol 50 MG tablet Commonly known as: ULTRAM Take 50 mg by mouth every 6 (six) hours as needed.   Ventolin HFA 108 (90 Base) MCG/ACT inhaler Generic drug: albuterol Inhale 1-2 puffs into the lungs every 4 (four) hours as needed for shortness of breath.      Allergies  Allergen Reactions  . Ketamine Anxiety and Other (See Comments)    Syncope episode/confusion     The results of significant diagnostics from this hospitalization (including imaging, microbiology, ancillary and laboratory) are listed below for reference.    Significant Diagnostic Studies: Ct Abdomen Pelvis W Contrast  Result Date: 08/13/2019 CLINICAL DATA:  Abdominal pain EXAM: CT ABDOMEN AND PELVIS WITH CONTRAST TECHNIQUE: Multidetector CT imaging of the abdomen and pelvis was performed using the standard protocol following bolus administration of  intravenous contrast. CONTRAST:  160m OMNIPAQUE IOHEXOL 300 MG/ML  SOLN COMPARISON:  07/22/2019 FINDINGS: Lower chest: Numerous bibasilar cavitary nodules are again seen and are similar prior study. The largest in the right lower lobe measures 16 mm, stable. Hepatobiliary: Prior cholecystectomy. Mild intrahepatic and extrahepatic biliary ductal dilatation. Common bile duct measures up to 13 mm, stable. This is likely related to post cholecystectomy state. No focal hepatic abnormality. Pancreas: No focal abnormality or ductal dilatation. Spleen: No focal abnormality.  Normal size. Adrenals/Urinary Tract: No adrenal  abnormality. No focal renal abnormality. No stones or hydronephrosis. Urinary bladder is unremarkable. Stomach/Bowel: Stomach, large and small bowel grossly unremarkable. Vascular/Lymphatic: Aortic atherosclerosis. No enlarged abdominal or pelvic lymph nodes. Reproductive: Uterus and adnexa unremarkable.  No mass. Other: No free fluid or free air. Musculoskeletal: No acute bony abnormality. IMPRESSION: Numerous bilateral lower lobe cavitary nodules, stable since prior study. Stable intrahepatic and extrahepatic biliary ductal dilatation, likely related to post cholecystectomy state. Aortic atherosclerosis. No acute findings in the abdomen or pelvis. Electronically Signed   By: KRolm BaptiseM.D.   On: 08/13/2019 11:07   Microbiology: Recent Results (from the past 240 hour(s))  Urine culture     Status: Abnormal   Collection Time: 08/13/19  8:50 AM   Specimen: Urine, Random  Result Value Ref Range Status   Specimen Description   Final    URINE, RANDOM [facility]formed at [facility] [address], [place] [address]   Special Requests   Final    NONE [facility] at [facility] [address], [place] [address]   Culture MULTIPLE SPECIES PRESENT, SUGGEST RECOLLECTION (A)  Final   Report Status 08/14/2019 FINAL  Final  SARS CORONAVIRUS 2 (TAT 6-24 HRS) Nasopharyngeal Nasopharyngeal Swab     Status: None   Collection Time: 08/13/19 12:36 PM   Specimen: Nasopharyngeal Swab  Result Value Ref Range Status   SARS Coronavirus 2 NEGATIVE NEGATIVE Final    Comment: (NOTE) SARS-CoV-2 target nucleic acids are NOT DETECTED. The SARS-CoV-2 RNA is generally detectable in upper and lower respiratory specimens during the acute phase of infection. Negative results do not preclude SARS-CoV-2 infection, do not rule out co-infections with other pathogens, and should not be used as the sole basis for treatment or other patient management decisions. Negative results must be  combined with clinical observations, patient history, and epidemiological information. The expected result is Negative. Fact Sheet for Patients: hSugarRoll.beFact Sheet for Healthcare Providers: hhttps://www.woods-mathews.com/This test is not yet approved or cleared by the [place] and  has been authorized for detection and/or diagnosis of SARS-CoV-2 by FDA under an Emergency Use Authorization (EUA). This EUA will remain  in effect (meaning this test can be used) for the duration of the COVID-19 declaration under Section 56 4(b)(1) of the Act, 21 U.S.C. section 360bbb-3(b)(1), unless the authorization is terminated or revoked sooner. Performed at [facility] [address] [place] [address]     Labs: Basic Metabolic Panel: Recent Labs  Lab 08/13/19 0850 08/14/19 0459 08/15/19 0601 08/16/19 0609 08/17/19 0532  NA 135 134* 136 137 137  K 3.5 4.0 4.0 3.2* 4.0  CL 100 103 100 100 100  CO2 22 24 24 28 29   GLUCOSE 113* 92 70 91 83  BUN 14 10 6 8 11   CREATININE 0.77 0.62 0.59 0.74 0.72  CALCIUM 9.0 8.8* 9.2 9.1 8.9  MG 1.6* 1.7 1.6* 1.5* 1.7  PHOS  3.3  --   --   --   --    Liver Function Tests: Recent Labs  Lab 08/13/19 0850 08/14/19 0459  AST 69* 48*  ALT 144* 115*  ALKPHOS 154* 170*  BILITOT 1.2 1.2  PROT 7.1 6.7  ALBUMIN 3.6 3.4*   Recent Labs  Lab 08/13/19 0850  LIPASE 18   CBC: Recent Labs  Lab 08/13/19 0850 08/14/19 0459 08/14/19 1539 08/15/19 0601 08/16/19 0609 08/17/19 0532 08/18/19 0701  WBC 1.0* 0.8*  --  1.4* 3.5* 5.3 4.4  NEUTROABS 0.4*  --   --  0.1* 1.9  --   --   HGB 14.2 12.1 11.9* 11.1* 10.9* 10.5* 11.0*  HCT 42.4 35.6* 35.3* 32.9* 32.6* 32.1* 33.1*  MCV 97.2 96.2  --  97.9 98.5 99.7 98.8  PLT 116* 90*  --  70* 60* 59* 65*   Recent Labs    05/23/19 1219  BNP 23.0     Principal Problem:   Intractable lower abdominal pain Active Problems:   Factor V Leiden  ([place])   Abdominal pain   Thrombocytopenia ([place])   Time coordinating discharge: 25 minutes  Signed:  Murray Hodgkins, MD  [facility]  08/19/2019, 12:07 PM

## 2019-08-21 ENCOUNTER — Ambulatory Visit (INDEPENDENT_AMBULATORY_CARE_PROVIDER_SITE_OTHER): Payer: Medicaid Other | Admitting: Family Medicine

## 2019-08-21 ENCOUNTER — Encounter: Payer: Self-pay | Admitting: Family Medicine

## 2019-08-21 ENCOUNTER — Telehealth: Payer: Self-pay

## 2019-08-21 DIAGNOSIS — F331 Major depressive disorder, recurrent, moderate: Secondary | ICD-10-CM

## 2019-08-21 DIAGNOSIS — K529 Noninfective gastroenteritis and colitis, unspecified: Secondary | ICD-10-CM

## 2019-08-21 DIAGNOSIS — D696 Thrombocytopenia, unspecified: Secondary | ICD-10-CM | POA: Diagnosis not present

## 2019-08-21 DIAGNOSIS — R112 Nausea with vomiting, unspecified: Secondary | ICD-10-CM | POA: Diagnosis not present

## 2019-08-21 DIAGNOSIS — D6851 Activated protein C resistance: Secondary | ICD-10-CM

## 2019-08-21 DIAGNOSIS — C538 Malignant neoplasm of overlapping sites of cervix uteri: Secondary | ICD-10-CM

## 2019-08-21 DIAGNOSIS — Z09 Encounter for follow-up examination after completed treatment for conditions other than malignant neoplasm: Secondary | ICD-10-CM

## 2019-08-21 DIAGNOSIS — Z7189 Other specified counseling: Secondary | ICD-10-CM

## 2019-08-21 DIAGNOSIS — K5900 Constipation, unspecified: Secondary | ICD-10-CM

## 2019-08-21 DIAGNOSIS — T451X5A Adverse effect of antineoplastic and immunosuppressive drugs, initial encounter: Secondary | ICD-10-CM

## 2019-08-21 DIAGNOSIS — D701 Agranulocytosis secondary to cancer chemotherapy: Secondary | ICD-10-CM | POA: Diagnosis not present

## 2019-08-21 NOTE — Telephone Encounter (Signed)
Attempted to reach patient to complete TCM call and confirm hospital follow up appt for today. Left message for patient to reach me directly at [phone]

## 2019-08-21 NOTE — Progress Notes (Signed)
Name: Joanna Hall   MRN: [id number]    DOB: 24-May-1966   Date:08/21/2019       Progress Note  Subjective:    Chief Complaint  Chief Complaint  Patient presents with  . Follow-up    abdominal pain, nausea , throwing up, just got  put back on chemo  . Depression    wants to see about being out back on medication     I connected with  ADRIEN DIETZMAN  on 08/21/19 at  2:40 PM EST by a video enabled telemedicine application and verified that I am speaking with the correct person using two identifiers.  I discussed the limitations of evaluation and management by telemedicine and the availability of in person appointments. The patient expressed understanding and agreed to proceed. Staff also discussed with the patient that there may be a patient responsible charge related to this service. Patient Location: home Provider Location: [facility] clinic Additional Individuals present: none   HPI   Pt presents for hospital follow up/transition of care  Admit date: 08/13/2019 Discharge date: 08/19/2019 Transition of care was initiated previously by Clemetine Marker today 08/21/2019, pt did not answer, but appt today was able to be scheduled.  Pt was admitted for intractable lower abdominal pain, N, V after chemo treatment 5-6 days prior to ER visit. 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  She also has history of factor V Leiden, renal artery thrombosis, lung thrombosis and DVT and is managed with Xarelto.  She presented to the ER with severe lower abdominal pain which radiated to her back  She was admitted to the medical floor for pain control, antiemetics and oncology was consulted.  She was also found to have neutropenia secondary to recent chemotherapy, hypomagnesmia which was replaced IV, LFT's elevated.  During her hospitalization she did decide DNR, she  consulted with chaplain and multiple physicians and wanted to enjoy her quality of life that she had left, also had discussions about hospice care, patient has elderly parents that she does not believe would be able to care for her so she did learn about how to care hospice care options. She did see the palliative care team who will assist to manage pain medications.  With her metastatic cervical cancer she is considering stopping her chemotherapy and will follow up with oncology.  Her neutropenia improved white blood cell count went from 0.8 to 3.4, but then had thrombocytopenia with platelets down to 70, no bleeding.  She was started on Granix by oncologist.  Concerns of immunocompromise from neutropenia prophylactic Levaquin was given. Transaminitis was monitored thought to be secondary to chemotherapy Oncologist recommended holding Xarelto if platelets drop below 50,000 -but the patient also had some bright red blood per rectum and Xarelto was intermittently held, her hemoglobin was stable and she was not transfused and concluded that they would not do a colonoscopy due to her immunocompromise state they would transfuse below 7, Protonix was started.  Patient does have follow-up with Dr. Rogue Bussing on November 12 to discuss and consider future treatment options for cervical cancer  The discharge diagnoses are as follows: Discharge Diagnoses: Principal diagnosis is #1 1. Abdominal on chronic pain with nausea and vomiting thought secondary to neoplasm. 2. Neutropenia secondary to chemotherapy 3. Transaminitis likely secondary to recent chemotherapy. 4. Cervical cancer with lung metastasis, currently undergoing chemotherapy 5. Thrombocytopenia 6. Chronic  adrenal insufficiency 7. PMH factor V Leiden mutation, renal artery stenosis, lung thrombosis, DVT  New medications started per hospitalization include : granix,   Labs due today are - none, oncology  Pt feels a little better, she feels good  about her DNR decision, but is still upset about end of life care and doesn't want her parents to have to come upstairs and find her dead.  GI bleed in hospital -  no blood in stool over the last couple days Now constipated - is holding levbid, lomotil Still taking narcotic meds    Pt is dealing with depression -she feels good about having signed a DNR and she does not want to die but she is feeling more depressed having to deal with her prognosis feels bad about her parents taking care of her and she would like to get back on some medications.  Not sleeping well - has been on amitriptyline 75 mg for a long time, not currently working. Depression screen [facility] 2/9 08/21/2019 05/30/2019  Decreased Interest 0 0  Down, Depressed, Hopeless 1 0  PHQ - 2 Score 1 0  Altered sleeping 3 0  Tired, decreased energy 3 0  Change in appetite 3 0  Feeling bad or failure about yourself  0 0  Trouble concentrating 0 0  Moving slowly or fidgety/restless 0 0  Suicidal thoughts 0 0  PHQ-9 Score 10 0  Difficult doing work/chores Not difficult at all Not difficult at all  Some recent data might be hidden  She was on other medication in the past for depression and she tolerated it, she can't recall what it was but she would like to try medications again.     Patient Active Problem List   Diagnosis Date Noted  . Thrombocytopenia ([place]) 08/17/2019  . Intractable lower abdominal pain   . Abdominal pain 07/23/2019  . Constipation 07/22/2019  . Malnutrition of moderate degree 06/22/2019  . Small bowel obstruction due to adhesions ([place]) 06/21/2019  . Acute gastroenteritis 06/14/2019  . Intractable nausea and vomiting 06/07/2019  . Chest pain in adult 05/19/2019  . Intractable pain 05/19/2019  . Cancer associated pain   . Proctitis, radiation   . Palliative care encounter   . Encounter for monitoring opioid maintenance therapy 11/19/2018  . Adrenal insufficiency ([place]) 09/03/2018  . Chronic diarrhea  06/25/2018  . Chronic anticoagulation 06/25/2018  . Vomiting   . Encounter for antineoplastic chemotherapy 06/04/2018  . 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 Lung metastasis ([place]) 05/15/2018  . Closed fracture of distal end of radius 05/04/2018  . History of total knee arthroplasty 04/24/2018  . Osteoarthritis of left knee 02/28/2018  . Condyloma acuminatum of anus s/p ablation 02/22/2018 02/22/2018  . Condyloma acuminatum of vagina s/p ablation 02/22/2018 02/22/2018  . Positive ANA (antinuclear antibody) 02/04/2018  . Aortic atherosclerosis ([place]) 01/15/2018  . Elevated MCV 01/15/2018  . Anemia 01/15/2018  . Genital warts 01/15/2018  . Vitamin D deficiency 01/15/2018  . Pernicious anemia   . Impingement syndrome of shoulder region 12/19/2017  . Multiple joint pain 12/19/2017  . Osteoarthritis of right knee 12/19/2017  . B12 deficiency 12/11/2017  . Lung nodules 12/11/2017  . History of Clostridium difficile infection 10/23/2017  . Factor V Leiden ([place]) 10/19/2017  . Goals of care, counseling/discussion 10/19/2017  . Cervical cancer, FIGO stage IB1 ([place]) 09/18/2017  . Chronic hepatitis C without hepatic coma ([place]) 09/18/2017  . Cytopenia 09/18/2017  . Diarrhea 09/18/2017  . Lower abdominal pain 09/18/2017  .  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 Malignant neoplasm of overlapping sites of cervix ([place]) 09/18/2017  . Renal insufficiency 09/18/2017  . Wound infection after surgery 09/12/2017  . Hypokalemia   . Hypomagnesemia   . Cervical arthritis 07/18/2017  . Dysuria 06/20/2017  . Metastatic cancer ([place]) 05/12/2017  . Essential hypertension 03/15/2017  . Anemia in other chronic diseases classified elsewhere 03/01/2017  . Chemotherapy-induced neutropenia ([place]) 01/29/2017  . 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[place]) 09/25/2016    Past Surgical History:  Procedure Laterality Date  . CHOLECYSTECTOMY    . COLON SURGERY  08/2017   resection  . COLONOSCOPY WITH PROPOFOL N/A  12/20/2017   Procedure: COLONOSCOPY WITH PROPOFOL;  Surgeon: [name], MD;  Location: [facility] ENDOSCOPY;  Service: Gastroenterology;  Laterality: N/A;  . COLONOSCOPY WITH PROPOFOL N/A 07/30/2018   Procedure: COLONOSCOPY WITH PROPOFOL;  Surgeon: [name], MD;  Location: [facility] ENDOSCOPY;  Service: Gastroenterology;  Laterality: N/A;  . COLONOSCOPY WITH PROPOFOL N/A 10/10/2018   Procedure: COLONOSCOPY WITH PROPOFOL;  Surgeon: Lucilla Lame, MD;  Location: [facility] ENDOSCOPY;  Service: Endoscopy;  Laterality: N/A;  . DIAGNOSTIC LAPAROSCOPY    . ESOPHAGOGASTRODUODENOSCOPY (EGD) WITH PROPOFOL N/A 12/20/2017   Procedure: ESOPHAGOGASTRODUODENOSCOPY (EGD) WITH PROPOFOL;  Surgeon: [name], MD;  Location: [place];  Service: Gastroenterology;  Laterality: N/A;  . ESOPHAGOGASTRODUODENOSCOPY (EGD) WITH PROPOFOL  07/30/2018   Procedure: ESOPHAGOGASTRODUODENOSCOPY (EGD) WITH PROPOFOL;  Surgeon: [name], MD;  Location: [facility] ENDOSCOPY;  Service: Gastroenterology;;  . Otho Darner SIGMOIDOSCOPY N/A 11/21/2018   Procedure: FLEXIBLE SIGMOIDOSCOPY;  Surgeon: [name], MD;  Location: [facility] ENDOSCOPY;  Service: Gastroenterology;  Laterality: N/A;  . LAPAROTOMY N/A 08/31/2017   Procedure: EXPLORATORY LAPAROTOMY for SBO, ileocolectomy, removal of piece of uterine wall;  Surgeon: [name], MD;  Location: [facility] ORS;  Service: General;  Laterality: N/A;  . LASER ABLATION CONDOLAMATA N/A 02/22/2018   Procedure: LASER ABLATION/REMOVAL OF [id number] AROUND ANUS AND VAGINA;  Surgeon: Michael Boston, MD;  Location: [place];  Service: General;  Laterality: N/A;  . OOPHORECTOMY    . PORTA CATH INSERTION N/A 05/13/2018   Procedure: PORTA CATH INSERTION;  Surgeon: Katha Cabal, MD;  Location: [place] CV LAB;  Service: Cardiovascular;  Laterality: N/A;  . SMALL INTESTINE SURGERY    . TANDEM RING INSERTION     x3  . THORACOTOMY    . TOTAL KNEE ARTHROPLASTY  Left 04/24/2018   Procedure: TOTAL KNEE ARTHROPLASTY;  Surgeon: Lovell Sheehan, MD;  Location: [facility] ORS;  Service: Orthopedics;  Laterality: Left;    Family History  Problem Relation Age of Onset  . Hypertension Father   . Diabetes Father   . Alcohol abuse Daughter   . Hypertension Maternal Grandmother   . Diabetes Maternal Grandmother   . Diabetes Paternal Grandmother   . Hypertension Paternal Grandmother     Social History   Socioeconomic History  . Marital status: Divorced    Spouse name: Not on file  . Number of children: Not on file  . Years of education: Not on file  . Highest education level: Not on file  Occupational History  . Not on file  Social Needs  . Financial resource strain: Not hard at all  . Food insecurity    Worry: Never true    Inability: Never true  . Transportation needs    Medical: No    Non-medical: No  Tobacco Use  . Smoking status: Former Smoker    Packs/day: 0.25  Years: 10.00    Pack years: 2.50    Types: Cigarettes    Quit date: 10/16/2006    Years since quitting: 12.8  . Smokeless tobacco: Never Used  Substance and Sexual Activity  . Alcohol use: Not Currently    Frequency: Never    Comment: seldom  . Drug use: Yes    Types: Marijuana    Comment: not very often  . Sexual activity: Not Currently    Birth control/protection: Post-menopausal    Comment: Not Asked  Lifestyle  . Physical activity    Days per week: Patient refused    Minutes per session: Patient refused  . Stress: Only a little  Relationships  . Social Herbalist on phone: Patient refused    Gets together: Patient refused    Attends religious service: Patient refused    Active member of club or organization: Patient refused    Attends meetings of clubs or organizations: Patient refused    Relationship status: Patient refused  . Intimate partner violence    Fear of current or ex partner: No    Emotionally abused: No    Physically abused: No     Forced sexual activity: No  Other Topics Concern  . Not on file  Social History Narrative  . Not on file     Current Outpatient Medications:  .  Calcium Carb-Cholecalciferol (CALCIUM 500 +D) 500-400 MG-UNIT TABS, Take 2 tablets by mouth daily., Disp: , Rfl:  .  colchicine 0.6 MG tablet, Take 0.6 mg by mouth 2 (two) times daily., Disp: , Rfl:  .  dexamethasone (DECADRON) 4 MG tablet, Take one pill AM & PM x 3 days; start the day prior to chemo., Disp: 60 tablet, Rfl: 0 .  diphenoxylate-atropine (LOMOTIL) 2.5-0.025 MG tablet, TAKE (2) TABLETS FOUR TIMES DAILY., Disp: 240 tablet, Rfl: 0 .  folic acid (FOLVITE) 1 MG tablet, Take 1 tablet (1 mg total) by mouth daily., Disp: 30 tablet, Rfl: 6 .  hydrocortisone (CORTEF) 20 MG tablet, Take 1 tablet (20 mg total) by mouth daily., Disp: 30 tablet, Rfl: 0 .  hydrOXYzine (ATARAX/VISTARIL) 10 MG tablet, Take 1 tablet (10 mg total) by mouth 3 (three) times daily as needed., Disp: 60 tablet, Rfl: 0 .  hyoscyamine (LEVBID) 0.375 MG 12 hr tablet, Take 1 tablet (0.375 mg total) by mouth 2 (two) times daily., Disp: 60 tablet, Rfl: 0 .  ondansetron (ZOFRAN ODT) 4 MG disintegrating tablet, Take 1 tablet (4 mg total) by mouth every 8 (eight) hours as needed for nausea or vomiting., Disp: 30 tablet, Rfl: 2 .  oxyCODONE (OXYCONTIN) 15 mg 12 hr tablet, Take 1 tablet (15 mg total) by mouth every 12 (twelve) hours. (Patient taking differently: Take 15 mg by mouth every 8 (eight) hours. ), Disp: 60 tablet, Rfl: 0 .  Oxycodone HCl 10 MG TABS, Take 1 tablet (10 mg total) by mouth every 6 (six) hours as needed (for breakthrough pain)., Disp: 120 tablet, Rfl: 0 .  pantoprazole (PROTONIX) 40 MG tablet, Take 1 tablet (40 mg total) by mouth daily., Disp: 90 tablet, Rfl: 1 .  potassium chloride SA (K-DUR) 20 MEQ tablet, Take 1 tablet (20 mEq total) by mouth daily as needed (if having nausea/vomiting- to supplements for lost potassium)., Disp: 30 tablet, Rfl: 0 .  promethazine  (PHENERGAN) 25 MG tablet, Take 1 tablet (25 mg total) by mouth every 8 (eight) hours as needed for nausea or vomiting., Disp: 30 tablet, Rfl: 0 .  rivaroxaban (XARELTO) 20 MG TABS tablet, Take 1 tablet (20 mg total) by mouth daily with supper. (Patient taking differently: Take 20 mg by mouth daily with supper. Takes in the morning), Disp: 30 tablet, Rfl: 6 .  traMADol (ULTRAM) 50 MG tablet, Take 50 mg by mouth every 6 (six) hours as needed., Disp: , Rfl:  .  VENTOLIN HFA 108 (90 Base) MCG/ACT inhaler, Inhale 1-2 puffs into the lungs every 4 (four) hours as needed for shortness of breath., Disp: 1 Inhaler, Rfl: 1 .  amitriptyline (ELAVIL) 75 MG tablet, Take 1 tablet (75 mg total) by mouth at bedtime., Disp: 90 tablet, Rfl: 1 .  famotidine (PEPCID) 20 MG tablet, Take 1 tablet (20 mg total) by mouth 2 (two) times daily for 5 days. (Patient taking differently: Take 20 mg by mouth 2 (two) times daily as needed. ), Disp: 10 tablet, Rfl: 0 No current facility-administered medications for this visit.   Facility-Administered Medications Ordered in Other Visits:  .  heparin lock flush 100 unit/mL, 500 Units, Intravenous, Once, Corcoran, Melissa C, MD .  sodium chloride 0.9 % 1,000 mL with potassium chloride 20 mEq infusion, , Intravenous, Once, Honor Loh E, NP .  sodium chloride flush (NS) 0.9 % injection 10 mL, 10 mL, Intravenous, Once, Borders, Kirt Boys, NP  Allergies  Allergen Reactions  . Ketamine Anxiety and Other (See Comments)    Syncope episode/confusion     I personally reviewed active problem list, medication list, allergies, family history, social history, health maintenance, notes from last encounter, lab results, imaging with the patient/caregiver today.  Review of Systems  Constitutional: Negative.   HENT: Negative.   Eyes: Negative.   Respiratory: Negative.   Cardiovascular: Negative.   Gastrointestinal: Negative.   Endocrine: Negative.   Genitourinary: Negative.    Musculoskeletal: Negative.   Skin: Negative.   Allergic/Immunologic: Negative.   Neurological: Negative.   Hematological: Negative.   Psychiatric/Behavioral: Negative.   All other systems reviewed and are negative.     Objective:    Virtual encounter, vitals limited, only able to obtain the following Today's Vitals   08/21/19 1345  PainSc: 6    There is no height or weight on file to calculate BMI. Nursing Note and Vital Signs reviewed.  Physical Exam Patient alert oriented x3, phonation clear, speaks in full sentences no audible wheeze or stridor PE limited by telephone encounter  No results found. However, due to the size of the patient record, not all encounters were searched. Please check Results Review for a complete set of results.  PHQ2/9: Depression screen [facility] 2/9 08/21/2019 05/30/2019  Decreased Interest 0 0  Down, Depressed, Hopeless 1 0  PHQ - 2 Score 1 0  Altered sleeping 3 0  Tired, decreased energy 3 0  Change in appetite 3 0  Feeling bad or failure about yourself  0 0  Trouble concentrating 0 0  Moving slowly or fidgety/restless 0 0  Suicidal thoughts 0 0  PHQ-9 Score 10 0  Difficult doing work/chores Not difficult at all Not difficult at all  Some recent data might be hidden   PHQ-2/9 Result is positive.   See below, restarting medications, pt doing palliative, she doesn't want therapy or psychiatry right now  Fall Risk: Fall Risk  08/21/2019 07/10/2019 05/30/2019 11/19/2018 10/22/2018  Falls in the past year? 0 0 0 0 1  Number falls in past yr: 0 - 0 - 0  Injury with Fall? 0 - 0 - 0  Assessment and Plan:     ICD-10-CM   1. Moderate episode of recurrent major depressive disorder (HCC)  F33.1 sertraline (ZOLOFT) 25 MG tablet   PHQ 9 positive, mood and conversation very appropriate today - restart zoloft - see other plan notes below  2. Intractable nausea and vomiting  R11.2    Resolved, she does not want to do any more cancer tx with severe SE  sending her to the hospital - follow up with oncology  3. Chemotherapy-induced neutropenia (HCC)  D70.1    T45.1X5A    Improved while in hospital after starting Granix, on prophylactic antibiotics, patient doing well without any concerns of infection or side effects  4. Thrombocytopenia ([place])  D69.6    Continue to monitor will be having labs repeated at oncology/hematology  5. Malignant neoplasm of overlapping sites of cervix [facility])  C53.8    Per oncology, currently metastatic cancer patient wishes to stop chemo radiation treatments and instead focus on quality of life  6. Factor V Leiden ([place])  D68.51    On Xarelto, was holding briefly while in the hospital while having bloody stools but that has resolved  7. Chronic diarrhea  K52.9    levbid and lomotil prn, currently holding, since now constipated and bloated  8. Constipation, unspecified constipation type  K59.00    new constipation/bloating -she says she was told it was a medication side effect and also may be a sign of worsening cancer, pt tolerating  9. Encounter for examination following treatment at hospital  Z09    Reviewed hospitalization, labs, discharge summary, transition of care follow-up phone call done patient has further follow-up with specialist  10. Hospital discharge follow-up  Z09   11. Goals of care, counseling/discussion  Z71.89    Discussed at length today palliative care versus hospice, future goals to have home health or to hospice, parents cannot care for her at home. DRN signed -we discussed CPR and intubation which patient does not wish for however we also discussed managing symptoms, side effects and treating mild acute issues. Patient's main concerns are that she does not want her elderly parents to find her dead in her home and they cannot really care for her.  They are not good going up and down the stairs and patient stays upstairs most the time.  Patient has had a palliative care consult while inpatient and I  encouraged her to discuss with oncology prognosis.  We could do MOST form, get her home health or do paperwork if she does need to be referred to hospice at a certain point. Main goals is that she would like to have quality of life as opposed to quantity, most of the recent cancer treatments have put her in the hospital multiple times and she would rather have less medications less treatment and less hospitalizations with the time that she has left.    MDD -  zoloft -start at 25 mg at night for a week and then increase to 50 mg at night -would like to see if this will help her sleep and help with her mood. If it does not help I would like her to f/up (4-6 weeks) Consider d/c amitriptyline and trial of Cymbalta and Lyrica -other meds that are effective for mood and chronic pain    I discussed the assessment and treatment plan with the patient. The patient was provided an opportunity to ask questions and all were answered. The patient agreed with the plan and demonstrated an understanding  of the instructions.  I provided 20 minutes of non-face-to-face time during this encounter.  Delsa Grana, PA-C 11/05/203:12 PM

## 2019-08-22 MED ORDER — SERTRALINE HCL 25 MG PO TABS: ORAL_TABLET | ORAL | 1 refills | Status: DC

## 2019-08-23 ENCOUNTER — Other Ambulatory Visit: Payer: Self-pay | Admitting: Internal Medicine

## 2019-08-25 ENCOUNTER — Other Ambulatory Visit: Payer: Self-pay | Admitting: *Deleted

## 2019-08-27 ENCOUNTER — Other Ambulatory Visit: Payer: Self-pay | Admitting: Hospice and Palliative Medicine

## 2019-08-27 ENCOUNTER — Other Ambulatory Visit: Payer: Self-pay

## 2019-08-27 ENCOUNTER — Encounter: Payer: Self-pay | Admitting: Oncology

## 2019-08-27 MED ORDER — OXYCODONE HCL ER 15 MG PO T12A: 15.0000 mg | EXTENDED_RELEASE_TABLET | Freq: Three times a day (TID) | ORAL | 0 refills | Status: DC

## 2019-08-27 NOTE — Progress Notes (Signed)
Patient pre screened for office appointment, no questions or concerns today. Patient reminded of upcoming appointment time and date. 

## 2019-08-27 NOTE — Progress Notes (Signed)
OxyContin was increased to 42m Q8H while patient was in the hospital. Pain is reportedly stable. Will refill at that dose/frequency. PDMP reviewed. #90 tablets

## 2019-08-28 ENCOUNTER — Other Ambulatory Visit: Payer: Self-pay

## 2019-08-28 ENCOUNTER — Inpatient Hospital Stay: Payer: Medicaid Other

## 2019-08-28 ENCOUNTER — Inpatient Hospital Stay (HOSPITAL_BASED_OUTPATIENT_CLINIC_OR_DEPARTMENT_OTHER): Payer: Medicaid Other | Admitting: Internal Medicine

## 2019-08-28 ENCOUNTER — Inpatient Hospital Stay: Payer: Medicaid Other | Attending: Internal Medicine

## 2019-08-28 ENCOUNTER — Inpatient Hospital Stay (HOSPITAL_BASED_OUTPATIENT_CLINIC_OR_DEPARTMENT_OTHER): Payer: Medicaid Other | Admitting: Hospice and Palliative Medicine

## 2019-08-28 VITALS — BP 147/90 | HR 83 | Temp 96.4°F | Resp 18

## 2019-08-28 DIAGNOSIS — G629 Polyneuropathy, unspecified: Secondary | ICD-10-CM | POA: Diagnosis not present

## 2019-08-28 DIAGNOSIS — R197 Diarrhea, unspecified: Secondary | ICD-10-CM | POA: Insufficient documentation

## 2019-08-28 DIAGNOSIS — G893 Neoplasm related pain (acute) (chronic): Secondary | ICD-10-CM | POA: Diagnosis not present

## 2019-08-28 DIAGNOSIS — C7800 Secondary malignant neoplasm of unspecified lung: Secondary | ICD-10-CM | POA: Insufficient documentation

## 2019-08-28 DIAGNOSIS — C538 Malignant neoplasm of overlapping sites of cervix uteri: Secondary | ICD-10-CM

## 2019-08-28 DIAGNOSIS — Z515 Encounter for palliative care: Secondary | ICD-10-CM

## 2019-08-28 DIAGNOSIS — G8929 Other chronic pain: Secondary | ICD-10-CM | POA: Insufficient documentation

## 2019-08-28 DIAGNOSIS — Z86711 Personal history of pulmonary embolism: Secondary | ICD-10-CM | POA: Insufficient documentation

## 2019-08-28 DIAGNOSIS — R918 Other nonspecific abnormal finding of lung field: Secondary | ICD-10-CM | POA: Diagnosis not present

## 2019-08-28 DIAGNOSIS — R7989 Other specified abnormal findings of blood chemistry: Secondary | ICD-10-CM | POA: Diagnosis not present

## 2019-08-28 DIAGNOSIS — Z7901 Long term (current) use of anticoagulants: Secondary | ICD-10-CM | POA: Diagnosis not present

## 2019-08-28 DIAGNOSIS — Z7189 Other specified counseling: Secondary | ICD-10-CM

## 2019-08-28 DIAGNOSIS — K5900 Constipation, unspecified: Secondary | ICD-10-CM | POA: Diagnosis not present

## 2019-08-28 DIAGNOSIS — Z95828 Presence of other vascular implants and grafts: Secondary | ICD-10-CM

## 2019-08-28 DIAGNOSIS — Z5111 Encounter for antineoplastic chemotherapy: Secondary | ICD-10-CM | POA: Diagnosis not present

## 2019-08-28 DIAGNOSIS — Z79899 Other long term (current) drug therapy: Secondary | ICD-10-CM | POA: Insufficient documentation

## 2019-08-28 DIAGNOSIS — Z86718 Personal history of other venous thrombosis and embolism: Secondary | ICD-10-CM | POA: Diagnosis not present

## 2019-08-28 DIAGNOSIS — C539 Malignant neoplasm of cervix uteri, unspecified: Secondary | ICD-10-CM | POA: Diagnosis present

## 2019-08-28 LAB — CBC WITH DIFFERENTIAL/PLATELET
Abs Immature Granulocytes: 0.04 10*3/uL (ref 0.00–0.07)
Basophils Absolute: 0 10*3/uL (ref 0.0–0.1)
Basophils Relative: 0 %
Eosinophils Absolute: 0 10*3/uL (ref 0.0–0.5)
Eosinophils Relative: 0 %
HCT: 36.2 % (ref 36.0–46.0)
Hemoglobin: 12.3 g/dL (ref 12.0–15.0)
Immature Granulocytes: 1 %
Lymphocytes Relative: 8 %
Lymphs Abs: 0.6 10*3/uL — ABNORMAL LOW (ref 0.7–4.0)
MCH: 33.9 pg (ref 26.0–34.0)
MCHC: 34 g/dL (ref 30.0–36.0)
MCV: 99.7 fL (ref 80.0–100.0)
Monocytes Absolute: 0.5 10*3/uL (ref 0.1–1.0)
Monocytes Relative: 7 %
Neutro Abs: 6.4 10*3/uL (ref 1.7–7.7)
Neutrophils Relative %: 84 %
Platelets: 292 10*3/uL (ref 150–400)
RBC: 3.63 MIL/uL — ABNORMAL LOW (ref 3.87–5.11)
RDW: 14.6 % (ref 11.5–15.5)
WBC: 7.5 10*3/uL (ref 4.0–10.5)
nRBC: 0 % (ref 0.0–0.2)

## 2019-08-28 LAB — COMPREHENSIVE METABOLIC PANEL
ALT: 56 U/L — ABNORMAL HIGH (ref 0–44)
AST: 42 U/L — ABNORMAL HIGH (ref 15–41)
Albumin: 3.7 g/dL (ref 3.5–5.0)
Alkaline Phosphatase: 150 U/L — ABNORMAL HIGH (ref 38–126)
Anion gap: 11 (ref 5–15)
BUN: 12 mg/dL (ref 6–20)
CO2: 22 mmol/L (ref 22–32)
Calcium: 9.7 mg/dL (ref 8.9–10.3)
Chloride: 105 mmol/L (ref 98–111)
Creatinine, Ser: 0.8 mg/dL (ref 0.44–1.00)
GFR calc Af Amer: >60 mL/min (ref >60)
GFR calc non Af Amer: >60 mL/min (ref >60)
Glucose, Bld: 98 mg/dL (ref 70–99)
Potassium: 4 mmol/L (ref 3.5–5.1)
Sodium: 138 mmol/L (ref 135–145)
Total Bilirubin: 0.5 mg/dL (ref 0.3–1.2)
Total Protein: 7.4 g/dL (ref 6.5–8.1)

## 2019-08-28 LAB — MAGNESIUM: Magnesium: 1.6 mg/dL — ABNORMAL LOW (ref 1.7–2.4)

## 2019-08-28 MED ORDER — SODIUM CHLORIDE 0.9 % IV SOLN
1000.0000 mg | Freq: Once | INTRAVENOUS | Status: AC
  Administered 2019-08-28: 1000 mg via INTRAVENOUS
  Filled 2019-08-28: qty 40

## 2019-08-28 MED ORDER — HEPARIN SOD (PORK) LOCK FLUSH 100 UNIT/ML IV SOLN
500.0000 [IU] | Freq: Once | INTRAVENOUS | Status: AC | PRN
  Administered 2019-08-28: 500 [IU]
  Filled 2019-08-28: qty 5

## 2019-08-28 MED ORDER — SODIUM CHLORIDE 0.9% FLUSH
10.0000 mL | Freq: Once | INTRAVENOUS | Status: AC
  Administered 2019-08-28: 10 mL via INTRAVENOUS
  Filled 2019-08-28: qty 10

## 2019-08-28 MED ORDER — SODIUM CHLORIDE 0.9 % IV SOLN
Freq: Once | INTRAVENOUS | Status: AC
  Administered 2019-08-28: 10:00:00 via INTRAVENOUS
  Filled 2019-08-28: qty 250

## 2019-08-28 MED ORDER — PROCHLORPERAZINE MALEATE 10 MG PO TABS
10.0000 mg | ORAL_TABLET | Freq: Once | ORAL | Status: AC
  Administered 2019-08-28: 10 mg via ORAL
  Filled 2019-08-28: qty 1

## 2019-08-28 NOTE — Progress Notes (Signed)
[place]  [phone] [phone]   Name: Joanna Hall Date: 08/28/2019 MRN: [id number]  DOB: [birth year]  Patient Care Team: Delsa Grana, PA-C as PCP - General (Family Medicine) Mellody Drown, MD as Consulting Physician (Obstetrics and Gynecology) [name], MD as Consulting Physician (Gastroenterology) Michael Boston, MD as Consulting Physician (General Surgery) Lovell Sheehan, MD as Consulting Physician (Orthopedic Surgery) Cammie Sickle, MD as Consulting Physician (Oncology) Earnestine Leys, MD as Consulting Physician (Orthopedic Surgery)    [place]: Palliative Care consult requested for [phone] y.o.femalewith multiple medical problems including 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 Patient was recently hospitalized 07/22/2019-07/24/2019 with abdominal pain and constipation.  She is now readmitted 08/13/2019 with intractable abdominal pain.  This is patient's sixth hospitalization in the past two months. Palliative care was consulted to help address symptoms and goals and manage ongoing symptoms.  SOCIAL HISTORY:     reports that she quit smoking about 12 years ago. Her smoking use included cigarettes. She has a 2.50 pack-year smoking history. She has never used smokeless tobacco. She reports previous alcohol use. She reports current drug use. Drug: Marijuana.   Patient is not married. She lives at home with her mother and father. She has an adult daughter who lives in [place]. She had another daughter who is now deceased.   ADVANCE DIRECTIVES:  Not on file  CODE STATUS: DNR  PAST MEDICAL HISTORY: Past Medical  History:  Diagnosis Date   Abdominal pain 06/10/2018   Abnormal cervical Papanicolaou smear 09/18/2017   Anxiety    Aortic atherosclerosis (HCC)    Arthritis    neck and knees   Blood clots in brain    both lungs and right kidney   Blood transfusion without reported diagnosis    Cervical cancer ([place]) 09/2016   mets lung   Chronic anal fissure    Chronic diarrhea    Dyspnea    Erosive gastropathy 09/18/2017   Factor V Leiden mutation ([place])    Fecal incontinence    Genital warts    GERD (gastroesophageal reflux disease)    GI bleed 10/08/2018   Heart murmur    Hematochezia    Hemorrhoids    Hepatitis C    Chronic, 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   Intestinal infection due to Clostridium difficile 09/18/2017   Macrocytic anemia with vitamin B12 deficiency    Multiple gastric ulcers    Nausea vomiting and diarrhea    Pancolitis ([place]) 07/27/2018   Perianal condylomata    Pneumonia    History of   Pulmonary nodules    Rectal bleeding    Small bowel obstruction ([place]) 08/2017   Stiff neck    limited right turn   Vitamin D deficiency     PAST SURGICAL HISTORY:  Past Surgical History:  Procedure Laterality Date   CHOLECYSTECTOMY     COLON SURGERY  08/2017   resection  COLONOSCOPY WITH PROPOFOL N/A 12/20/2017   Procedure: COLONOSCOPY WITH PROPOFOL;  Surgeon: [name], MD;  Location: [facility] ENDOSCOPY;  Service: Gastroenterology;  Laterality: N/A;   COLONOSCOPY WITH PROPOFOL N/A 07/30/2018   Procedure: COLONOSCOPY WITH PROPOFOL;  Surgeon: [name], MD;  Location: [facility] ENDOSCOPY;  Service: Gastroenterology;  Laterality: N/A;   COLONOSCOPY WITH  PROPOFOL N/A 10/10/2018   Procedure: COLONOSCOPY WITH PROPOFOL;  Surgeon: Lucilla Lame, MD;  Location: [facility] ENDOSCOPY;  Service: Endoscopy;  Laterality: N/A;   DIAGNOSTIC LAPAROSCOPY     ESOPHAGOGASTRODUODENOSCOPY (EGD) WITH PROPOFOL N/A 12/20/2017   Procedure: ESOPHAGOGASTRODUODENOSCOPY (EGD) WITH PROPOFOL;  Surgeon: [name], MD;  Location: [facility] ENDOSCOPY;  Service: Gastroenterology;  Laterality: N/A;   ESOPHAGOGASTRODUODENOSCOPY (EGD) WITH PROPOFOL  07/30/2018   Procedure: ESOPHAGOGASTRODUODENOSCOPY (EGD) WITH PROPOFOL;  Surgeon: [name], MD;  Location: [facility] ENDOSCOPY;  Service: Gastroenterology;;   [place] N/A 11/21/2018   Procedure: FLEXIBLE SIGMOIDOSCOPY;  Surgeon: [name], MD;  Location: [facility] ENDOSCOPY;  Service: Gastroenterology;  Laterality: N/A;   LAPAROTOMY N/A 08/31/2017   Procedure: EXPLORATORY LAPAROTOMY for SBO, ileocolectomy, removal of piece of uterine wall;  Surgeon: [name], MD;  Location: [facility] ORS;  Service: General;  Laterality: N/A;   LASER ABLATION CONDOLAMATA N/A 02/22/2018   Procedure: LASER ABLATION/REMOVAL OF [id number] AROUND ANUS AND VAGINA;  Surgeon: Michael Boston, MD;  Location: [place];  Service: General;  Laterality: N/A;   OOPHORECTOMY     PORTA CATH INSERTION N/A 05/13/2018   Procedure: PORTA CATH INSERTION;  Surgeon: Katha Cabal, MD;  Location: [place] CV LAB;  Service: Cardiovascular;  Laterality: N/A;   SMALL INTESTINE SURGERY     TANDEM RING INSERTION     x3   THORACOTOMY     TOTAL KNEE ARTHROPLASTY Left 04/24/2018   Procedure: TOTAL KNEE ARTHROPLASTY;  Surgeon: Lovell Sheehan, MD;  Location: [facility] ORS;  Service: Orthopedics;  Laterality: Left;    HEMATOLOGY/ONCOLOGY HISTORY:  Oncology History Overview Note  # dec 2017- CERVICAL ADENO CA [[place]]; Stage IB [Dr. Christene Slates at [place] [facility] in [place], [place];No surgery-  Chemo-RT;   # 2018-sep - lung nodules [in [place]]  # AUG 20th, 2019-#1 Carbo-Taxol s/p 6 cycles- [No avastin sec to blood clots]: Feb 2020-bilateral lung nodules with 1 cm in size. March 2020- finished carbo-taxol #7; poor tolerance to [place].  # April 15 th 2020- Start Taxol weekly x 3; one week OFF;  # July 2020-CT- bil cavitary lesions- slightly bigger by few mm/overall STABLE; CT a/p-NED. STOP Taxol;  # July , 23rd 2020- Keytruda [CPS-15 ]x 4 cycles; CT mid OCT 2020-progressive disease in the lungs.;  Stop [facility]  # Oct 22nd Alimta q 3 w  # summer-/fall 2019-Bil PE/kidney infract; factor V Leiden Leiden - xarelto [small bowel obstruction]; moved to [facility]   #Fall 2019 PE/renal infarct on Xarelto-factor V Leiden  # NGS/[facility]- PDL CPS 15; NEG for other targets**  ------------------------------------------------------------- She was treated by  Decision was made to pursue concurrent chemotherapy (weekly cisplatin) and radiation.  She received treatment from 11/2016-05/2017.  01/2017 cisplatin x 2 and carboplatin x 1 (01/29/2017) due to ARF and XRT.  XRT was followed by T & O on 02/01/2017 and T & N 02/10/2017 and 02/20/2017.  Course was complicated by 80 pound weight loss, nausea, vomiting, electrolyte wasting (potassium and magnesium).  She describes that.  Is been sick constantly requiring at least 20 hospitalizations.  Follow-up CT  chest and PET on 06/2017. Per patient, 'radiation worked' and no disease in the abdomen.  At that time she was noted to have lung nodules that were growing and follow-up imaging was scheduled for 10/2017.  She was admitted to hospital in [place] for small bowel obstruction which was managed conservatively and she was home for a week prior to traveling to [place] for Thanksgiving holiday where she has family.  She presented to ER in [place] on 08/2017 with nausea, vomiting, and lower abdominal pain.  Symptoms did not respond to conservative  treatment.   CT on 08/26/2017 revealed small bowel obstruction with transition in the pelvis just superior to the uterus rather was a long segment of distal ileum with fatty wall thickening compatible with chronic inflammation and/or radiation enteritis. Imaging showed numerous pulmonary nodules consistent with metastatic disease. She underwent laparotomy and right ileocolectomy on 08/31/2017 at [facility].  Surgical findings revealed a thickened, matted, and scarred piece of distal small bowel close to the ileocecal valve.  She was discharged on 09/05/2017.  Pain markedly increased in intensity and imaging was performed on 09/11/2017 which revealed: Debris within the anterior abdominal wall incision concerning for infection versus packing material, s/p post ileo-colectomy with expected postoperative changes, mild colonic ileus, numerous pulmonary nodules highly concerning for metastatic disease, punctate nonobstructing nephrolithiasis.  Staples were removed and one was packed.  She was started on doxycycline.  Abdominal and pelvic CT without contrast on 09/11/2017 revealed debris within anterior abdominal wall incision concerning for infection, versus packing material.   She is s/p ileocolectomy with expected postoperative changes and mild colonic ileus.  There were numerous pulmonary nodules highly concerning for metastatic disease and punctate nonobstructing nephrolithiasis. She was readmitted on 09/12/2017.  She describes the onset of lower abdominal pain on 09/09/2017.  Pain markedly increased in intensity on 09/11/2017.   Staples were removed and the wound packed.  She was started on doxycycline.  CT on 09/13/2017 showed postsurgical changes from ileocecectomy with primary ileocolic anastomosis without evidence of abscess or leak, edema small bowel loops of distal ileum, gas within ventral midline surgical wound corresponding to wound infection versus packing material, small infarct at the inferior pole of  left kidney, right uterine infarct.  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   PET scan was ordered to evaluate enlarging lung nodules with concern for recurrent cervical cancer but scan was delayed due to insurance and need to be performed in [place].  Presented to ER on 12/03/2017 for abdominal pain and emesis.  Imaging concerning for worsening possible uterine infarct and she was admitted to hospital.  Pelvic MRI was unremarkable.  Remote scarring type changes of uterus thought to be possibly related to radiation.  She was discharged on 12/08/2017.  Underwent endoscopy and colonoscopy on 12/20/2017.    On 02/22/2018 she underwent laser ablation of condylomata around the anus and vagina under anesthesia with Dr. Johney Maine.   04/15/2018:  Chest, abdomen, and pelvis CT revealed innumerable (> 100) cavitary nodules scattered in the lungs, moderately enlarging compared to the 11/08/2017 PET-CT, suspicious for metastatic disease.  One index node in the RLL measures 1.0 x 1.1 cm (previously 0.6 x 0.6 cm).  There were no new nodules.  There was an ill-defined wall thickening in the rectosigmoid with surrounding stranding along fascia planes, probably sequela from prior radiation therapy.  There was multilevel lumbar impingement due to spondylosis and degenerative disc disease.  There  was heterogeneous enhancement in the uterus (some possibly from prior radiation therapy).   04/23/2018:  PET scan revealed numerous scattered solid and cavitary nodules in the lungs stable increased in size compared to the prior PET-CT from 11/08/2017. Largest nodule was 1.1 cm in the LUL (SUV 1.9).  These demonstrated low-grade metabolic activity up to a maximum SUV of about 2.3, increased from 11/08/2017.    Case was discussed at tumor board on 04/25/2018. Consensus to pursue CT-guided biopsy (05/06/18) which revealed: Metastatic adenocarcinoma, morphologically consistent with cervical  adenocarcinoma.  She has history of chronic hepatitis C which is managed by GI.  Hepatitis C genotype is 2a/2c.  She receives B12 injections for history of B12 deficiency.  On 04/24/2018 she underwent left total knee replacement with Dr. [name]. --------------------------------------------------------   DIAGNOSIS: Cervical adenocarcinoma  STAGE: IV        ;GOALS: Palliative  CURRENT/MOST RECENT THERAPY : Alimta    Malignant neoplasm of overlapping sites of cervix ([place])  01/29/2019 - 04/23/2019 Chemotherapy   The patient had PACLitaxel (TAXOL) 156 mg in sodium chloride 0.9 % 250 mL chemo infusion (</= 74m/m2), 80 mg/m2 = 156 mg, Intravenous,  Once, 3 of 4 cycles Dose modification: 65 mg/m2 (original dose 80 mg/m2, Cycle 1, Reason: Provider Judgment) Administration: 156 mg (01/29/2019), 156 mg (02/05/2019), 126 mg (02/12/2019), 126 mg (02/26/2019), 126 mg (03/13/2019), 126 mg (03/27/2019), 126 mg (03/20/2019), 126 mg (04/03/2019), 126 mg (04/10/2019)  for chemotherapy treatment.    05/08/2019 - 07/30/2019 Chemotherapy   The patient had pembrolizumab (KEYTRUDA) 200 mg in sodium chloride 0.9 % 50 mL chemo infusion, 200 mg, Intravenous, Once, 4 of 6 cycles Administration: 200 mg (05/08/2019), 200 mg (05/29/2019), 200 mg (06/19/2019), 200 mg (07/10/2019)  for chemotherapy treatment.    08/07/2019 -  Chemotherapy   The patient had pegfilgrastim-cbqv (UDENYCA) injection 6 mg, 6 mg, Subcutaneous, Once, 1 of 5 cycles PEMEtrexed (ALIMTA) 1,000 mg in sodium chloride 0.9 % 100 mL chemo infusion, 950 mg, Intravenous,  Once, 2 of 6 cycles Administration: 1,000 mg (08/07/2019), 1,000 mg (08/28/2019)  for chemotherapy treatment.      ALLERGIES:  is allergic to ketamine.  MEDICATIONS:  Current Outpatient Medications  Medication Sig Dispense Refill   amitriptyline (ELAVIL) 75 MG tablet Take 1 tablet (75 mg total) by mouth at bedtime. 90 tablet 1   Calcium Carb-Cholecalciferol (CALCIUM 500 +D) 500-400 MG-UNIT TABS  Take 2 tablets by mouth daily.     colchicine 0.6 MG tablet Take 0.6 mg by mouth 2 (two) times daily.     dexamethasone (DECADRON) 4 MG tablet Take one pill AM & PM x 3 days; start the day prior to chemo. 60 tablet 0   diphenoxylate-atropine (LOMOTIL) 2.5-0.025 MG tablet TAKE (2) TABLETS FOUR TIMES DAILY. 240 tablet 0   famotidine (PEPCID) 20 MG tablet Take 1 tablet (20 mg total) by mouth 2 (two) times daily for 5 days. (Patient taking differently: Take 20 mg by mouth 2 (two) times daily as needed. ) 10 tablet 0   folic acid (FOLVITE) 1 MG tablet Take 1 tablet (1 mg total) by mouth daily. 30 tablet 6   hydrocortisone (CORTEF) 20 MG tablet Take 1 tablet (20 mg total) by mouth daily. 30 tablet 0   hydrOXYzine (ATARAX/VISTARIL) 10 MG tablet Take 1 tablet (10 mg total) by mouth 3 (three) times daily as needed. 60 tablet 0   hyoscyamine (LEVBID) 0.375 MG 12 hr tablet Take 1 tablet (0.375 mg total) by mouth 2 (two)  times daily. 60 tablet 0   ondansetron (ZOFRAN ODT) 4 MG disintegrating tablet Take 1 tablet (4 mg total) by mouth every 8 (eight) hours as needed for nausea or vomiting. 30 tablet 2   oxyCODONE (OXYCONTIN) 15 mg 12 hr tablet Take 1 tablet (15 mg total) by mouth every 8 (eight) hours. 90 tablet 0   Oxycodone HCl 10 MG TABS Take 1 tablet (10 mg total) by mouth every 6 (six) hours as needed (for breakthrough pain). 120 tablet 0   pantoprazole (PROTONIX) 40 MG tablet Take 1 tablet (40 mg total) by mouth daily. 90 tablet 1   potassium chloride SA (K-DUR) 20 MEQ tablet Take 1 tablet (20 mEq total) by mouth daily as needed (if having nausea/vomiting- to supplements for lost potassium). 30 tablet 0   promethazine (PHENERGAN) 25 MG tablet Take 1 tablet (25 mg total) by mouth every 8 (eight) hours as needed for nausea or vomiting. 30 tablet 6   rivaroxaban (XARELTO) 20 MG TABS tablet Take 1 tablet (20 mg total) by mouth daily with supper. (Patient taking differently: Take 20 mg by mouth  daily with supper. Takes in the morning) 30 tablet 6   sertraline (ZOLOFT) 25 MG tablet Take 25 mg PO before bedtime daily for 1 week, then take 50 mg (2 tabs) PO before bedtime daily 60 tablet 1   traMADol (ULTRAM) 50 MG tablet Take 50 mg by mouth every 6 (six) hours as needed.     VENTOLIN HFA 108 (90 Base) MCG/ACT inhaler Inhale 1-2 puffs into the lungs every 4 (four) hours as needed for shortness of breath. 1 Inhaler 1   No current facility-administered medications for this visit.    Facility-Administered Medications Ordered in Other Visits  Medication Dose Route Frequency Provider Last Rate Last Dose   heparin lock flush 100 unit/mL  500 Units Intravenous Once Corcoran, Melissa C, MD       sodium chloride 0.9 % 1,000 mL with potassium chloride 20 mEq infusion   Intravenous Once Honor Loh E, NP       sodium chloride flush (NS) 0.9 % injection 10 mL  10 mL Intravenous Once Jaleea Alesi, Kirt Boys, NP        VITAL SIGNS: There were no vitals taken for this visit. There were no vitals filed for this visit.  Estimated body mass index is 27.1 kg/m as calculated from the following:   Height as of an earlier encounter on 08/28/19: 5' 7"  (1.702 m).   Weight as of an earlier encounter on 08/28/19: 173 lb (78.5 kg).  LABS: CBC:    Component Value Date/Time   WBC 7.5 08/28/2019 0808   HGB 12.3 08/28/2019 0808   HCT 36.2 08/28/2019 0808   PLT 292 08/28/2019 0808   MCV 99.7 08/28/2019 0808   NEUTROABS 6.4 08/28/2019 0808   LYMPHSABS 0.6 (L) 08/28/2019 0808   MONOABS 0.5 08/28/2019 0808   EOSABS 0.0 08/28/2019 0808   BASOSABS 0.0 08/28/2019 0808   Comprehensive Metabolic Panel:    Component Value Date/Time   NA 138 08/28/2019 0808   K 4.0 08/28/2019 0808   CL 105 08/28/2019 0808   CO2 22 08/28/2019 0808   BUN 12 08/28/2019 0808   CREATININE 0.80 08/28/2019 0808   GLUCOSE 98 08/28/2019 0808   CALCIUM 9.7 08/28/2019 0808   AST 42 (H) 08/28/2019 0808   ALT 56 (H) 08/28/2019 0808    ALKPHOS 150 (H) 08/28/2019 0808   BILITOT 0.5 08/28/2019 0808   PROT  7.4 08/28/2019 0808   ALBUMIN 3.7 08/28/2019 5697    RADIOGRAPHIC STUDIES: Ct Chest Wo Contrast  Result Date: 08/01/2019 CLINICAL DATA:  Cervical cancer with lung metastases. EXAM: CT CHEST WITHOUT CONTRAST TECHNIQUE: Multidetector CT imaging of the chest was performed following the standard protocol without IV contrast. COMPARISON:  Abdomen pelvis CT 07/22/2019. Chest CT 05/18/2019. FINDINGS: Cardiovascular: The heart size is normal. No substantial pericardial effusion. Atherosclerotic calcification is noted in the wall of the thoracic aorta. Right Port-A-Cath tip is positioned at the SVC/RA junction. Mediastinum/Nodes: No mediastinal lymphadenopathy. No evidence for gross hilar lymphadenopathy although assessment is limited by the lack of intravenous contrast on today's study. The esophagus has normal imaging features. There is no axillary lymphadenopathy. Lungs/Pleura: Innumerable bilateral pulmonary nodules are evident, many of which are cavitary. Index right upper lobe nodule on 59/3 measures 1.2 cm today increased from 0.9 cm when I remeasure in a similar fashion on the prior study. Measuring another index nodule in the left lower lobe (63/3) this nodule is 1.4 cm today compared to 1.2 cm when remeasured on the prior study. A third nodule in the right lung base (96/3) is 1.7 cm today compared to 1.5 cm when remeasured similarly on the prior study. No pleural effusion. Upper Abdomen: Unremarkable. Musculoskeletal: No worrisome lytic or sclerotic osseous abnormality. IMPRESSION: 1. Interval progression of innumerable bilateral pulmonary nodules, many of which are cavitary. Imaging features are compatible with progression of metastatic disease. 2.  Aortic Atherosclerois (ICD10-170.0) Electronically Signed   By: Misty Stanley M.D.   On: 08/01/2019 14:09   Ct Abdomen Pelvis W Contrast  Result Date: 08/13/2019 CLINICAL DATA:   Abdominal pain EXAM: CT ABDOMEN AND PELVIS WITH CONTRAST TECHNIQUE: Multidetector CT imaging of the abdomen and pelvis was performed using the standard protocol following bolus administration of intravenous contrast. CONTRAST:  150m OMNIPAQUE IOHEXOL 300 MG/ML  SOLN COMPARISON:  07/22/2019 FINDINGS: Lower chest: Numerous bibasilar cavitary nodules are again seen and are similar prior study. The largest in the right lower lobe measures 16 mm, stable. Hepatobiliary: Prior cholecystectomy. Mild intrahepatic and extrahepatic biliary ductal dilatation. Common bile duct measures up to 13 mm, stable. This is likely related to post cholecystectomy state. No focal hepatic abnormality. Pancreas: No focal abnormality or ductal dilatation. Spleen: No focal abnormality.  Normal size. Adrenals/Urinary Tract: No adrenal abnormality. No focal renal abnormality. No stones or hydronephrosis. Urinary bladder is unremarkable. Stomach/Bowel: Stomach, large and small bowel grossly unremarkable. Vascular/Lymphatic: Aortic atherosclerosis. No enlarged abdominal or pelvic lymph nodes. Reproductive: Uterus and adnexa unremarkable.  No mass. Other: No free fluid or free air. Musculoskeletal: No acute bony abnormality. IMPRESSION: Numerous bilateral lower lobe cavitary nodules, stable since prior study. Stable intrahepatic and extrahepatic biliary ductal dilatation, likely related to post cholecystectomy state. Aortic atherosclerosis. No acute findings in the abdomen or pelvis. Electronically Signed   By: KRolm BaptiseM.D.   On: 08/13/2019 11:07   Dg Fluoro Guide Cv Line Right  Result Date: 08/05/2019 INDICATION: No blood return from port. EXAM: Port-A-Cath check. MEDICATIONS: None.; The antibiotic was administered within an appropriate time interval prior to skin puncture. ANESTHESIA/SEDATION: None. FLUOROSCOPY TIME:  Fluoroscopy Time: 0 minutes minutes 24 seconds (at 10 mGy). COMPLICATIONS: None. PROCEDURE: Through the existing  Port-A-Cath access approximately 10 cc of Omnipaque 300 was administered. Frontal and oblique views obtained. Port-A-Cath is in widely patent. No evidence of fracture or displacement. No collagen sheath noted. IMPRESSION: Normal exam.  Widely patent Port-A-Cath. Electronically Signed   By:  Kamrar   On: 08/05/2019 12:14    PERFORMANCE STATUS (ECOG) : 2 - Symptomatic, <50% confined to bed  Review of Systems Unless otherwise noted, a complete review of systems is negative.  Physical Exam General: NAD, frail appearing, thin Pulmonary: unlabored Extremities: no edema, no joint deformities Skin: no rashes Neurological: Weakness but otherwise nonfocal  IMPRESSION: Routine follow-up visit with patient following discharge from the hospital.  She reports that she is doing well.  She denies any acute changes or concerns.  She says that her diarrhea has resolved and she is now having some constipation.  Pain has been reportedly improved after increasing the OxyContin to every 8 hours.  Patient says that she is having some emotional stressors at home as her father was recently hospitalized due to suicidal ideation.  She says he is doing better and should be discharging home soon.  Emotional support provided.  Patient had previously discussed her consideration of stopping treatment but she seems to be more committed to continuing for now as she is feeling better.  Case and plan discussed with Dr. Rogue Bussing.  PLAN: -Continue current scope of treatment -Continue OxyContin 15 mg every 8 hours -Continue oxycodone IR 10 mg every 6 hours as needed for breakthrough pain -Prophylactic bowel regimen -Follow-up telephone visit in 3 to 4 weeks   Patient expressed understanding and was in agreement with this plan. She also understands that She can call the clinic at any time with any questions, concerns, or complaints.     Time Total: 15 minutes  Visit consisted of counseling and education  dealing with the complex and emotionally intense issues of symptom management and palliative care in the setting of serious and potentially life-threatening illness.Greater than 50%  of this time was spent counseling and coordinating care related to the above assessment and plan.  Signed by: Joanna Harm, PhD, NP-C

## 2019-08-28 NOTE — Assessment & Plan Note (Addendum)
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 Currently on Alimta.  #Patient tolerated Alimta with moderate-severe difficulties needing admission to the hospital [see below].   # proceed with Alimta #2; Labs today reviewed;  acceptable for treatment today.  Patient again understands treatments are palliative.  #Severe neutropenia/elevated LFTs-secondary chemotherapy cycle #1; proceed with on growth factor support.  LFTs improved.  #Port malfunction; October 2020 dissected normal; okay to support for chemo  # Left shoulder effusion/inflammatory arthritis/ ? Right knee fluid aspiration currently on "gout medication"/ colchcine.  Stable.  # Adrenal insufficiency-on hydrocortisone 20 mg/day.  #Chronic diarrhea/constipation-stable.  #Peripheral neuropathy stable continue gabapentin.  # Hypomagnesemia-secondary platinum chemotherapy-today mag 1.6.  Stable hold off infusion.  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  # DISPOSITION:  # Alimta today; tomorrow udenyca; NO mag.  # follow up in 3 weeks-MD-Alimta /cbc/cmp/mag/; Mag 4 gm/2 hour; Dr.B

## 2019-08-28 NOTE — Progress Notes (Signed)
[place] NOTE  Patient Care Team: Delsa Grana, PA-C as PCP - General (Family Medicine) Mellody Drown, MD as Consulting Physician (Obstetrics and Gynecology) [name], MD as Consulting Physician (Gastroenterology) Michael Boston, MD as Consulting Physician (General Surgery) Lovell Sheehan, MD as Consulting Physician (Orthopedic Surgery) Cammie Sickle, MD as Consulting Physician (Oncology) Earnestine Leys, MD as Consulting Physician (Orthopedic Surgery)  CHIEF COMPLAINTS/PURPOSE OF CONSULTATION: Cervical cancer  #  Oncology History Overview Note  # dec 2017- CERVICAL ADENO CA [[place]]; Stage IB [Dr. Christene Slates at [facility] in [place], [place];No surgery- Chemo-RT;   # 2018-sep - lung nodules [in [place]]  # AUG 20th, 2019-#1 Carbo-Taxol s/p 6 cycles- [No avastin sec to blood clots]: Feb 2020-bilateral lung nodules with 1 cm in size. March 2020- finished carbo-taxol #7; poor tolerance to [place].  # April 15 th 2020- Start Taxol weekly x 3; one week OFF;  # July 2020-CT- bil cavitary lesions- slightly bigger by few mm/overall STABLE; CT a/p-NED. 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  She received treatment from 11/2016-05/2017.  01/2017 cisplatin x 2 and carboplatin x 1 (01/29/2017) due to ARF and XRT.  XRT was followed by T & O on 02/01/2017 and T & N 02/10/2017 and 02/20/2017.  Course was  complicated by 80 pound weight loss, nausea, vomiting, electrolyte wasting (potassium and magnesium).  She describes that.  Is been sick constantly requiring at least 20 hospitalizations.  Follow-up CT chest and PET on 06/2017. Per patient, 'radiation worked' and no disease in the abdomen.  At that time she was noted to have lung nodules that were growing and follow-up imaging was scheduled for 10/2017.  She was admitted to hospital in [place] for small bowel obstruction which was managed conservatively and she was home for a week prior to traveling to [place] for Thanksgiving holiday where she has family.  She presented to ER in [place] on 08/2017 with nausea, vomiting, and lower abdominal pain.  Symptoms did not respond to conservative treatment.   CT on 08/26/2017 revealed small bowel obstruction with transition in the pelvis just superior to the uterus rather was a long segment of distal ileum with fatty wall thickening compatible with chronic inflammation and/or radiation enteritis. Imaging showed numerous pulmonary nodules consistent with metastatic disease. She underwent laparotomy and right ileocolectomy on 08/31/2017 at [facility].  Surgical findings revealed a thickened, matted, and scarred piece of distal small bowel close to the ileocecal valve.  She was discharged on 09/05/2017.  Pain markedly increased in intensity and imaging was performed on 09/11/2017 which revealed: Debris within the anterior abdominal wall incision concerning for infection versus packing material, s/p post ileo-colectomy with expected postoperative changes, mild colonic ileus, numerous pulmonary nodules highly concerning for metastatic disease, punctate nonobstructing nephrolithiasis.  Staples were removed and one was packed.  She was started on doxycycline.  Abdominal and pelvic CT without contrast on 09/11/2017 revealed debris within anterior abdominal wall incision concerning for  infection, versus packing  material.   She is s/p ileocolectomy with expected postoperative changes and mild colonic ileus.  There were numerous pulmonary nodules highly concerning for metastatic disease and punctate nonobstructing nephrolithiasis. She was readmitted on 09/12/2017.  She describes the onset of lower abdominal pain on 09/09/2017.  Pain markedly increased in intensity on 09/11/2017.   Staples were removed and the wound packed.  She was started on doxycycline.  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   PET scan was ordered to evaluate enlarging lung nodules with concern for recurrent cervical cancer but scan was delayed due to insurance and need to be performed in [place].  Presented to ER on 12/03/2017 for abdominal pain and emesis.  Imaging concerning for worsening possible uterine infarct and she was admitted to hospital.  Pelvic MRI was unremarkable.  Remote scarring type changes of uterus thought to be possibly related to radiation.  She was discharged on 12/08/2017.  Underwent endoscopy and colonoscopy on 12/20/2017.    On 02/22/2018 she underwent laser ablation of condylomata around the anus and vagina under anesthesia with Dr. Johney Maine.   04/15/2018:  Chest, abdomen, and pelvis CT revealed innumerable (> 100) cavitary nodules scattered in the lungs, moderately enlarging compared to the 11/08/2017 PET-CT, suspicious for metastatic disease.  One index node in the RLL measures 1.0 x 1.1 cm (previously 0.6 x 0.6 cm).  There were no new nodules.  There was an ill-defined wall thickening in the rectosigmoid with surrounding stranding  along fascia planes, probably sequela from prior radiation therapy.  There was multilevel lumbar impingement due to spondylosis and degenerative disc disease.  There was heterogeneous enhancement in the uterus (some possibly from prior radiation therapy).   04/23/2018:  PET scan revealed numerous scattered solid and cavitary nodules in the lungs stable increased in size compared to the prior PET-CT from 11/08/2017. Largest nodule was 1.1 cm in the LUL (SUV 1.9).  These demonstrated low-grade metabolic activity up to a maximum SUV of about 2.3, increased from 11/08/2017.    Case was discussed at tumor board on 04/25/2018. Consensus to pursue CT-guided biopsy (05/06/18) which revealed: Metastatic adenocarcinoma, morphologically consistent with cervical adenocarcinoma.  She has history of chronic hepatitis C which is managed by GI.  Hepatitis C genotype is 2a/2c.  She receives B12 injections for history of B12 deficiency.  On 04/24/2018 she underwent left total knee replacement with Dr. [name]. --------------------------------------------------------   DIAGNOSIS: Cervical adenocarcinoma  STAGE: IV        ;GOALS: Palliative  CURRENT/MOST RECENT THERAPY : Alimta    Malignant neoplasm of overlapping sites of cervix ([place])  01/29/2019 - 04/23/2019 Chemotherapy   The patient had PACLitaxel (TAXOL) 156 mg in sodium chloride 0.9 % 250 mL chemo infusion (</= 40m/m2), 80 mg/m2 = 156 mg, Intravenous,  Once, 3 of 4 cycles Dose modification: 65 mg/m2 (original dose 80 mg/m2, Cycle 1, Reason: Provider Judgment) Administration: 156 mg (01/29/2019), 156 mg (02/05/2019), 126 mg (02/12/2019), 126 mg (02/26/2019), 126 mg (03/13/2019), 126 mg (03/27/2019), 126 mg (03/20/2019), 126 mg (04/03/2019), 126 mg (04/10/2019)  for chemotherapy treatment.    05/08/2019 - 07/30/2019 Chemotherapy   The  patient had pembrolizumab (KEYTRUDA) 200 mg in sodium chloride 0.9 % 50 mL chemo infusion, 200 mg, Intravenous, Once, 4 of 6  cycles Administration: 200 mg (05/08/2019), 200 mg (05/29/2019), 200 mg (06/19/2019), 200 mg (07/10/2019)  for chemotherapy treatment.    08/07/2019 -  Chemotherapy   The patient had pegfilgrastim-cbqv (UDENYCA) injection 6 mg, 6 mg, Subcutaneous, Once, 1 of 5 cycles PEMEtrexed (ALIMTA) 1,000 mg in sodium chloride 0.9 % 100 mL chemo infusion, 950 mg, Intravenous,  Once, 2 of 6 cycles Administration: 1,000 mg (08/07/2019), 1,000 mg (08/28/2019)  for chemotherapy treatment.       HISTORY OF PRESENTING ILLNESS:  Joanna Hall 53 y.o.  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  Patient had setting of a lump approximately 3 weeks ago.  Patient again had an admission to hospital for abdominal pain diarrhea/neutropenia and also elevated LFTs.  Patient symptoms improved with conservative management/growth factor support.  Currently denies nausea vomiting but appetite is fair.  Positive for constipation at this time.  Continues to have chronic joint pains back pain.  Review of Systems  Constitutional: Positive for malaise/fatigue. Negative for chills, diaphoresis, fever and weight loss.  HENT: Negative for nosebleeds and sore throat.   Eyes: Negative for double vision.  Respiratory: Negative for cough, hemoptysis, sputum production, shortness of breath and wheezing.   Cardiovascular: Negative for chest pain, palpitations, orthopnea and leg swelling.  Gastrointestinal: Positive for abdominal pain and constipation. Negative for blood in stool, heartburn, melena, nausea and vomiting.  Genitourinary: Negative for dysuria, frequency and urgency.  Musculoskeletal: Positive for back pain and joint pain.  Skin: Negative.  Negative for itching and rash.  Neurological: Positive for tingling. Negative for dizziness, focal weakness, weakness and headaches.  Endo/Heme/Allergies: Does not  bruise/bleed easily.  Psychiatric/Behavioral: Negative for depression. The patient is not nervous/anxious and does not have insomnia.      MEDICAL HISTORY:  Past Medical History:  Diagnosis Date  . Abdominal pain 06/10/2018  . Abnormal cervical Papanicolaou smear 09/18/2017  . Anxiety   . Aortic atherosclerosis ([place])   . Arthritis    neck and knees  . 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 Blood transfusion without reported diagnosis   . Cervical cancer ([place]) 09/2016   mets lung  . Chronic anal fissure   . Chronic diarrhea   . Dyspnea   . Erosive gastropathy 09/18/2017  . Factor V Leiden mutation ([place])   . Fecal incontinence   . Genital warts   . GERD (gastroesophageal reflux disease)   . GI bleed 10/08/2018  . Heart murmur   . Hematochezia   . Hemorrhoids   . 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 Hepatitis, chronic ([place]) 05/05/2017  . History of cancer chemotherapy    completed 06/2017  . History of Clostridium difficile infection    while undergoing chemo.  Negative test 10/2017  . Ileocolic anastomotic leak   . 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 Intestinal infection due to Clostridium difficile 09/18/2017  . Macrocytic anemia with vitamin B12 deficiency   . Multiple gastric ulcers   . Nausea vomiting and diarrhea   . Pancolitis ([place]) 07/27/2018  . Perianal condylomata   . Pneumonia    History of  . Pulmonary nodules   .  Rectal bleeding   . Small bowel obstruction ([place]) 08/2017  . Stiff neck    limited right turn  . Vitamin D deficiency     SURGICAL HISTORY: Past Surgical History:  Procedure Laterality Date  . CHOLECYSTECTOMY    . COLON SURGERY  08/2017   resection  . COLONOSCOPY WITH PROPOFOL N/A 12/20/2017   Procedure: COLONOSCOPY WITH PROPOFOL;  Surgeon: [name], MD;  Location: [facility] ENDOSCOPY;  Service: Gastroenterology;  Laterality: N/A;  . COLONOSCOPY WITH PROPOFOL N/A 07/30/2018    Procedure: COLONOSCOPY WITH PROPOFOL;  Surgeon: [name], MD;  Location: [facility] ENDOSCOPY;  Service: Gastroenterology;  Laterality: N/A;  . COLONOSCOPY WITH PROPOFOL N/A 10/10/2018   Procedure: COLONOSCOPY WITH PROPOFOL;  Surgeon: Lucilla Lame, MD;  Location: [facility] ENDOSCOPY;  Service: Endoscopy;  Laterality: N/A;  . DIAGNOSTIC LAPAROSCOPY    . ESOPHAGOGASTRODUODENOSCOPY (EGD) WITH PROPOFOL N/A 12/20/2017   Procedure: ESOPHAGOGASTRODUODENOSCOPY (EGD) WITH PROPOFOL;  Surgeon: [name], MD;  Location: [employer];  Service: Gastroenterology;  Laterality: N/A;  . ESOPHAGOGASTRODUODENOSCOPY (EGD) WITH PROPOFOL  07/30/2018   Procedure: ESOPHAGOGASTRODUODENOSCOPY (EGD) WITH PROPOFOL;  Surgeon: [name], MD;  Location: [facility] ENDOSCOPY;  Service: Gastroenterology;;  . Otho Darner SIGMOIDOSCOPY N/A 11/21/2018   Procedure: FLEXIBLE SIGMOIDOSCOPY;  Surgeon: [name], MD;  Location: [facility] ENDOSCOPY;  Service: Gastroenterology;  Laterality: N/A;  . LAPAROTOMY N/A 08/31/2017   Procedure: EXPLORATORY LAPAROTOMY for SBO, ileocolectomy, removal of piece of uterine wall;  Surgeon: [name], MD;  Location: [facility] ORS;  Service: General;  Laterality: N/A;  . LASER ABLATION CONDOLAMATA N/A 02/22/2018   Procedure: LASER ABLATION/REMOVAL OF GEZMOQHUTML AROUND ANUS AND VAGINA;  Surgeon: Michael Boston, MD;  Location: [facility];  Service: General;  Laterality: N/A;  . OOPHORECTOMY    . PORTA CATH INSERTION N/A 05/13/2018   Procedure: PORTA CATH INSERTION;  Surgeon: Katha Cabal, MD;  Location: [facility];  Service: Cardiovascular;  Laterality: N/A;  . SMALL INTESTINE SURGERY    . TANDEM RING INSERTION     x3  . THORACOTOMY    . TOTAL KNEE ARTHROPLASTY Left 04/24/2018   Procedure: TOTAL KNEE ARTHROPLASTY;  Surgeon: Lovell Sheehan, MD;  Location: [facility] ORS;  Service: Orthopedics;  Laterality: Left;    SOCIAL HISTORY: Social History    Socioeconomic History  . Marital status: Divorced    Spouse name: Not on file  . Number of children: Not on file  . Years of education: Not on file  . Highest education level: Not on file  Occupational History  . Not on file  Social Needs  . Financial resource strain: Not hard at all  . Food insecurity    Worry: Never true    Inability: Never true  . Transportation needs    Medical: No    Non-medical: No  Tobacco Use  . Smoking status: Former Smoker    Packs/day: 0.25    Years: 10.00    Pack years: 2.50    Types: Cigarettes    Quit date: 10/16/2006    Years since quitting: 12.8  . Smokeless tobacco: Never Used  Substance and Sexual Activity  . Alcohol use: Not Currently    Frequency: Never    Comment: seldom  . Drug use: Yes    Types: Marijuana    Comment: not very often  . Sexual activity: Not Currently    Birth control/protection: Post-menopausal    Comment: Not Asked  Lifestyle  . Physical activity  Days per week: Patient refused    Minutes per session: Patient refused  . Stress: Only a little  Relationships  . Social Herbalist on phone: Patient refused    Gets together: Patient refused    Attends religious service: Patient refused    Active member of club or organization: Patient refused    Attends meetings of clubs or organizations: Patient refused    Relationship status: Patient refused  . Intimate partner violence    Fear of current or ex partner: No    Emotionally abused: No    Physically abused: No    Forced sexual activity: No  Other Topics Concern  . Not on file  Social History Narrative  . Not on file    FAMILY HISTORY: Family History  Problem Relation Age of Onset  . Hypertension Father   . Diabetes Father   . Alcohol abuse Daughter   . Hypertension Maternal Grandmother   . Diabetes Maternal Grandmother   . Diabetes Paternal Grandmother   . Hypertension Paternal Grandmother     ALLERGIES:  is allergic to  ketamine.  MEDICATIONS:  Current Outpatient Medications  Medication Sig Dispense Refill  . Calcium Carb-Cholecalciferol (CALCIUM 500 +D) 500-400 MG-UNIT TABS Take 2 tablets by mouth daily.    . colchicine 0.6 MG tablet Take 0.6 mg by mouth 2 (two) times daily.    Marland Kitchen dexamethasone (DECADRON) 4 MG tablet Take one pill AM & PM x 3 days; start the day prior to chemo. 60 tablet 0  . diphenoxylate-atropine (LOMOTIL) 2.5-0.025 MG tablet TAKE (2) TABLETS FOUR TIMES DAILY. 240 tablet 0  . famotidine (PEPCID) 20 MG tablet Take 1 tablet (20 mg total) by mouth 2 (two) times daily for 5 days. (Patient taking differently: Take 20 mg by mouth 2 (two) times daily as needed. ) 10 tablet 0  . folic acid (FOLVITE) 1 MG tablet Take 1 tablet (1 mg total) by mouth daily. 30 tablet 6  . hydrocortisone (CORTEF) 20 MG tablet Take 1 tablet (20 mg total) by mouth daily. 30 tablet 0  . hydrOXYzine (ATARAX/VISTARIL) 10 MG tablet Take 1 tablet (10 mg total) by mouth 3 (three) times daily as needed. 60 tablet 0  . hyoscyamine (LEVBID) 0.375 MG 12 hr tablet Take 1 tablet (0.375 mg total) by mouth 2 (two) times daily. 60 tablet 0  . ondansetron (ZOFRAN ODT) 4 MG disintegrating tablet Take 1 tablet (4 mg total) by mouth every 8 (eight) hours as needed for nausea or vomiting. 30 tablet 2  . Oxycodone HCl 10 MG TABS Take 1 tablet (10 mg total) by mouth every 6 (six) hours as needed (for breakthrough pain). 120 tablet 0  . pantoprazole (PROTONIX) 40 MG tablet Take 1 tablet (40 mg total) by mouth daily. 90 tablet 1  . potassium chloride SA (K-DUR) 20 MEQ tablet Take 1 tablet (20 mEq total) by mouth daily as needed (if having nausea/vomiting- to supplements for lost potassium). 30 tablet 0  . promethazine (PHENERGAN) 25 MG tablet Take 1 tablet (25 mg total) by mouth every 8 (eight) hours as needed for nausea or vomiting. 30 tablet 6  . rivaroxaban (XARELTO) 20 MG TABS tablet Take 1 tablet (20 mg total) by mouth daily with supper.  (Patient taking differently: Take 20 mg by mouth daily with supper. Takes in the morning) 30 tablet 6  . sertraline (ZOLOFT) 25 MG tablet Take 25 mg PO before bedtime daily for 1 week, then take 50 mg (  2 tabs) PO before bedtime daily 60 tablet 1  . traMADol (ULTRAM) 50 MG tablet Take 50 mg by mouth every 6 (six) hours as needed.    . VENTOLIN HFA 108 (90 Base) MCG/ACT inhaler Inhale 1-2 puffs into the lungs every 4 (four) hours as needed for shortness of breath. 1 Inhaler 1  . amitriptyline (ELAVIL) 75 MG tablet Take 1 tablet (75 mg total) by mouth at bedtime. 90 tablet 1  . oxyCODONE (OXYCONTIN) 15 mg 12 hr tablet Take 1 tablet (15 mg total) by mouth every 8 (eight) hours. 90 tablet 0   No current facility-administered medications for this visit.    Facility-Administered Medications Ordered in Other Visits  Medication Dose Route Frequency Provider Last Rate Last Dose  . heparin lock flush 100 unit/mL  500 Units Intravenous Once Corcoran, Melissa C, MD      . sodium chloride 0.9 % 1,000 mL with potassium chloride 20 mEq infusion   Intravenous Once Honor Loh E, NP      . sodium chloride flush (NS) 0.9 % injection 10 mL  10 mL Intravenous Once Borders, Kirt Boys, NP          .  PHYSICAL EXAMINATION: ECOG PERFORMANCE STATUS: 1 - Symptomatic but completely ambulatory  Vitals:   08/28/19 0849  BP: (!) 143/90  Pulse: 90  Resp: 20  Temp: (!) 96.4 F (35.8 C)   Filed Weights   08/28/19 0849  Weight: 173 lb (78.5 kg)    Physical Exam  Constitutional: She is oriented to person, place, and time and well-developed, well-nourished, and in no distress.  Alone.  Walking herself.   HENT:  Head: Normocephalic and atraumatic.  Mouth/Throat: Oropharynx is clear and moist. No oropharyngeal exudate.  Eyes: Pupils are equal, round, and reactive to light.  Neck: Normal range of motion. Neck supple.  Cardiovascular: Normal rate and regular rhythm.  Pulmonary/Chest: Effort normal and breath  sounds normal. No respiratory distress. She has no wheezes.  Abdominal: Soft. Bowel sounds are normal. She exhibits no distension and no mass. There is no rebound and no guarding.  Musculoskeletal: Normal range of motion.        General: No tenderness or edema.  Neurological: She is alert and oriented to person, place, and time.  Skin: Skin is warm.  Psychiatric: Affect normal.     LABORATORY DATA:  I have reviewed the data as listed Lab Results  Component Value Date   WBC 7.5 08/28/2019   HGB 12.3 08/28/2019   HCT 36.2 08/28/2019   MCV 99.7 08/28/2019   PLT 292 08/28/2019   Recent Labs    10/21/18 0845  08/13/19 0850 08/14/19 0459  08/16/19 0609 08/17/19 0532 08/28/19 0808  NA 134*   < > 135 134*   < > 137 137 138  K 3.9   < > 3.5 4.0   < > 3.2* 4.0 4.0  CL 98   < > 100 103   < > 100 100 105  CO2 24   < > 22 24   < > 28 29 22   GLUCOSE 111*   < > 113* 92   < > 91 83 98  BUN 17   < > 14 10   < > 8 11 12   CREATININE 0.91   < > 0.77 0.62   < > 0.74 0.72 0.80  CALCIUM 9.6   < > 9.0 8.8*   < > 9.1 8.9 9.7  GFRNONAA >60   < > >  60 >60   < > >60 >60 >60  GFRAA >60   < > >60 >60   < > >60 >60 >60  PROT 8.6*   < > 7.1 6.7  --   --   --  7.4  ALBUMIN 4.4   < > 3.6 3.4*  --   --   --  3.7  AST 48*   < > 69* 48*  --   --   --  42*  ALT 67*   < > 144* 115*  --   --   --  56*  ALKPHOS 185*   < > 154* 170*  --   --   --  150*  BILITOT 1.4*   < > 1.2 1.2  --   --   --  0.5  BILIDIR 0.3*  --   --   --   --   --   --   --    < > = values in this interval not displayed.    RADIOGRAPHIC STUDIES: I have personally reviewed the radiological images as listed and agreed with the findings in the report. Ct Chest Wo Contrast  Result Date: 08/01/2019 CLINICAL DATA:  Cervical cancer with lung metastases. EXAM: CT CHEST WITHOUT CONTRAST TECHNIQUE: Multidetector CT imaging of the chest was performed following the standard protocol without IV contrast. COMPARISON:  Abdomen pelvis CT 07/22/2019.  Chest CT 05/18/2019. FINDINGS: Cardiovascular: The heart size is normal. No substantial pericardial effusion. Atherosclerotic calcification is noted in the wall of the thoracic aorta. Right Port-A-Cath tip is positioned at the SVC/RA junction. Mediastinum/Nodes: No mediastinal lymphadenopathy. No evidence for gross hilar lymphadenopathy although assessment is limited by the lack of intravenous contrast on today's study. The esophagus has normal imaging features. There is no axillary lymphadenopathy. Lungs/Pleura: Innumerable bilateral pulmonary nodules are evident, many of which are cavitary. Index right upper lobe nodule on 59/3 measures 1.2 cm today increased from 0.9 cm when I remeasure in a similar fashion on the prior study. Measuring another index nodule in the left lower lobe (63/3) this nodule is 1.4 cm today compared to 1.2 cm when remeasured on the prior study. A third nodule in the right lung base (96/3) is 1.7 cm today compared to 1.5 cm when remeasured similarly on the prior study. No pleural effusion. Upper Abdomen: Unremarkable. Musculoskeletal: No worrisome lytic or sclerotic osseous abnormality. IMPRESSION: 1. Interval progression of innumerable bilateral pulmonary nodules, many of which are cavitary. Imaging features are compatible with progression of metastatic disease. 2.  Aortic Atherosclerois (ICD10-170.0) Electronically Signed   By: Misty Stanley M.D.   On: 08/01/2019 14:09   Ct Abdomen Pelvis W Contrast  Result Date: 08/13/2019 CLINICAL DATA:  Abdominal pain EXAM: CT ABDOMEN AND PELVIS WITH CONTRAST TECHNIQUE: Multidetector CT imaging of the abdomen and pelvis was performed using the standard protocol following bolus administration of intravenous contrast. CONTRAST:  151m OMNIPAQUE IOHEXOL 300 MG/ML  SOLN COMPARISON:  07/22/2019 FINDINGS: Lower chest: Numerous bibasilar cavitary nodules are again seen and are similar prior study. The largest in the right lower lobe measures 16 mm,  stable. Hepatobiliary: Prior cholecystectomy. Mild intrahepatic and extrahepatic biliary ductal dilatation. Common bile duct measures up to 13 mm, stable. This is likely related to post cholecystectomy state. No focal hepatic abnormality. Pancreas: No focal abnormality or ductal dilatation. Spleen: No focal abnormality.  Normal size. Adrenals/Urinary Tract: No adrenal abnormality. No focal renal abnormality. No stones or hydronephrosis. Urinary bladder is unremarkable. Stomach/Bowel: Stomach, large  and small bowel grossly unremarkable. Vascular/Lymphatic: Aortic atherosclerosis. No enlarged abdominal or pelvic lymph nodes. Reproductive: Uterus and adnexa unremarkable.  No mass. Other: No free fluid or free air. Musculoskeletal: No acute bony abnormality. IMPRESSION: Numerous bilateral lower lobe cavitary nodules, stable since prior study. Stable intrahepatic and extrahepatic biliary ductal dilatation, likely related to post cholecystectomy state. Aortic atherosclerosis. No acute findings in the abdomen or pelvis. Electronically Signed   By: Rolm Baptise M.D.   On: 08/13/2019 11:07   Dg Fluoro Guide Cv Line Right  Result Date: 08/05/2019 INDICATION: No blood return from port. EXAM: Port-A-Cath check. MEDICATIONS: None.; The antibiotic was administered within an appropriate time interval prior to skin puncture. ANESTHESIA/SEDATION: None. FLUOROSCOPY TIME:  Fluoroscopy Time: 0 minutes minutes 24 seconds (at 10 mGy). COMPLICATIONS: None. PROCEDURE: Through the existing Port-A-Cath access approximately 10 cc of Omnipaque 300 was administered. Frontal and oblique views obtained. Port-A-Cath is in widely patent. No evidence of fracture or displacement. No collagen sheath noted. IMPRESSION: Normal exam.  Widely patent Port-A-Cath. 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 Currently on Alimta.  #Patient tolerated Alimta with moderate-severe difficulties needing admission to the hospital [see below].   # proceed with Alimta #2; Labs today reviewed;  acceptable for treatment today.  Patient again understands treatments are palliative.  #Severe neutropenia/elevated LFTs-secondary chemotherapy cycle #1; proceed with on growth factor support.  LFTs improved.  #Port malfunction; October 2020 dissected normal; okay to support for chemo  # Left shoulder effusion/inflammatory arthritis/ ? Right knee fluid aspiration currently on "gout medication"/ colchcine.  Stable.  # Adrenal insufficiency-on hydrocortisone 20 mg/day.  #Chronic diarrhea/constipation-stable.  #Peripheral neuropathy stable continue gabapentin.  # Hypomagnesemia-secondary platinum chemotherapy-today mag 1.6.  Stable hold off infusion.  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  # DISPOSITION:  # Alimta today; tomorrow udenyca; NO mag.  # follow up in 3 weeks-MD-Alimta /cbc/cmp/mag/; Mag 4 gm/2 hour; Dr.B     All questions were answered. The patient knows to call the clinic with any problems, questions or concerns.     Cammie Sickle, MD 08/29/2019 8:19 AM

## 2019-08-29 ENCOUNTER — Inpatient Hospital Stay: Payer: Medicaid Other

## 2019-08-29 ENCOUNTER — Other Ambulatory Visit: Payer: Self-pay | Admitting: Internal Medicine

## 2019-08-29 ENCOUNTER — Other Ambulatory Visit: Payer: Self-pay

## 2019-08-29 DIAGNOSIS — C538 Malignant neoplasm of overlapping sites of cervix uteri: Secondary | ICD-10-CM

## 2019-08-29 DIAGNOSIS — Z7189 Other specified counseling: Secondary | ICD-10-CM

## 2019-08-29 DIAGNOSIS — Z5111 Encounter for antineoplastic chemotherapy: Secondary | ICD-10-CM | POA: Diagnosis not present

## 2019-08-29 MED ORDER — PEGFILGRASTIM-CBQV 6 MG/0.6ML ~~LOC~~ SOSY
6.0000 mg | PREFILLED_SYRINGE | Freq: Once | SUBCUTANEOUS | Status: AC
  Administered 2019-08-29: 6 mg via SUBCUTANEOUS
  Filled 2019-08-29: qty 0.6

## 2019-09-01 ENCOUNTER — Other Ambulatory Visit: Payer: Self-pay | Admitting: *Deleted

## 2019-09-01 ENCOUNTER — Telehealth: Payer: Self-pay | Admitting: *Deleted

## 2019-09-01 ENCOUNTER — Other Ambulatory Visit: Payer: Self-pay | Admitting: Hospice and Palliative Medicine

## 2019-09-01 ENCOUNTER — Encounter: Payer: Self-pay | Admitting: Internal Medicine

## 2019-09-01 NOTE — Telephone Encounter (Signed)
Duplicate phone enounter

## 2019-09-01 NOTE — Telephone Encounter (Signed)
Left vm for the patient to return my phone call. rcvd my chart msg form patient re: being "sick." requesting call back asap  Pt out of town - 2 hrs away.  Left msg that patient should go to the nearest ER if "sick" for evaluation.

## 2019-09-01 NOTE — Telephone Encounter (Signed)
Patient in ER In [facility]

## 2019-09-02 MED ORDER — SODIUM CHLORIDE 0.9 % IV SOLN
200.00 | INTRAVENOUS | Status: DC
Start: ? — End: 2019-09-02

## 2019-09-02 MED ORDER — OXYCODONE HCL 10 MG PO TABS: 10.0000 mg | ORAL_TABLET | Freq: Four times a day (QID) | ORAL | 0 refills | Status: DC | PRN

## 2019-09-10 ENCOUNTER — Encounter: Payer: Self-pay | Admitting: Nurse Practitioner

## 2019-09-10 ENCOUNTER — Other Ambulatory Visit: Payer: Self-pay

## 2019-09-10 ENCOUNTER — Inpatient Hospital Stay: Payer: Medicaid Other

## 2019-09-10 ENCOUNTER — Inpatient Hospital Stay (HOSPITAL_BASED_OUTPATIENT_CLINIC_OR_DEPARTMENT_OTHER): Payer: Medicaid Other | Admitting: Nurse Practitioner

## 2019-09-10 ENCOUNTER — Other Ambulatory Visit: Payer: Self-pay | Admitting: *Deleted

## 2019-09-10 VITALS — BP 128/88 | HR 108 | Temp 97.2°F | Resp 18 | Wt 170.6 lb

## 2019-09-10 DIAGNOSIS — E876 Hypokalemia: Secondary | ICD-10-CM

## 2019-09-10 DIAGNOSIS — Z95828 Presence of other vascular implants and grafts: Secondary | ICD-10-CM

## 2019-09-10 DIAGNOSIS — C538 Malignant neoplasm of overlapping sites of cervix uteri: Secondary | ICD-10-CM

## 2019-09-10 DIAGNOSIS — Z5111 Encounter for antineoplastic chemotherapy: Secondary | ICD-10-CM | POA: Diagnosis not present

## 2019-09-10 DIAGNOSIS — G893 Neoplasm related pain (acute) (chronic): Secondary | ICD-10-CM

## 2019-09-10 DIAGNOSIS — R197 Diarrhea, unspecified: Secondary | ICD-10-CM

## 2019-09-10 DIAGNOSIS — R112 Nausea with vomiting, unspecified: Secondary | ICD-10-CM

## 2019-09-10 LAB — CBC WITH DIFFERENTIAL/PLATELET
Abs Immature Granulocytes: 0.23 10*3/uL — ABNORMAL HIGH (ref 0.00–0.07)
Basophils Absolute: 0 10*3/uL (ref 0.0–0.1)
Basophils Relative: 0 %
Eosinophils Absolute: 0.1 10*3/uL (ref 0.0–0.5)
Eosinophils Relative: 1 %
HCT: 32.5 % — ABNORMAL LOW (ref 36.0–46.0)
Hemoglobin: 10.6 g/dL — ABNORMAL LOW (ref 12.0–15.0)
Immature Granulocytes: 2 %
Lymphocytes Relative: 6 %
Lymphs Abs: 0.6 10*3/uL — ABNORMAL LOW (ref 0.7–4.0)
MCH: 33 pg (ref 26.0–34.0)
MCHC: 32.6 g/dL (ref 30.0–36.0)
MCV: 101.2 fL — ABNORMAL HIGH (ref 80.0–100.0)
Monocytes Absolute: 0.5 10*3/uL (ref 0.1–1.0)
Monocytes Relative: 5 %
Neutro Abs: 8.5 10*3/uL — ABNORMAL HIGH (ref 1.7–7.7)
Neutrophils Relative %: 86 %
Platelets: 153 10*3/uL (ref 150–400)
RBC: 3.21 MIL/uL — ABNORMAL LOW (ref 3.87–5.11)
RDW: 14.7 % (ref 11.5–15.5)
WBC: 9.9 10*3/uL (ref 4.0–10.5)
nRBC: 0 % (ref 0.0–0.2)

## 2019-09-10 LAB — URINALYSIS, COMPLETE (UACMP) WITH MICROSCOPIC
Bilirubin Urine: NEGATIVE
Glucose, UA: NEGATIVE mg/dL
Hgb urine dipstick: NEGATIVE
Ketones, ur: 20 mg/dL — AB
Leukocytes,Ua: NEGATIVE
Nitrite: NEGATIVE
Protein, ur: NEGATIVE mg/dL
Specific Gravity, Urine: 1.02 (ref 1.005–1.030)
pH: 6 (ref 5.0–8.0)

## 2019-09-10 LAB — COMPREHENSIVE METABOLIC PANEL
ALT: 58 U/L — ABNORMAL HIGH (ref 0–44)
AST: 80 U/L — ABNORMAL HIGH (ref 15–41)
Albumin: 3.3 g/dL — ABNORMAL LOW (ref 3.5–5.0)
Alkaline Phosphatase: 200 U/L — ABNORMAL HIGH (ref 38–126)
Anion gap: 13 (ref 5–15)
BUN: 9 mg/dL (ref 6–20)
CO2: 24 mmol/L (ref 22–32)
Calcium: 8.7 mg/dL — ABNORMAL LOW (ref 8.9–10.3)
Chloride: 99 mmol/L (ref 98–111)
Creatinine, Ser: 0.66 mg/dL (ref 0.44–1.00)
GFR calc Af Amer: >60 mL/min (ref >60)
GFR calc non Af Amer: >60 mL/min (ref >60)
Glucose, Bld: 94 mg/dL (ref 70–99)
Potassium: 3 mmol/L — ABNORMAL LOW (ref 3.5–5.1)
Sodium: 136 mmol/L (ref 135–145)
Total Bilirubin: 1.2 mg/dL (ref 0.3–1.2)
Total Protein: 7.2 g/dL (ref 6.5–8.1)

## 2019-09-10 LAB — GASTROINTESTINAL PANEL BY PCR, STOOL (REPLACES STOOL CULTURE)

## 2019-09-10 LAB — C DIFFICILE QUICK SCREEN W PCR REFLEX
C Diff antigen: NEGATIVE
C Diff interpretation: NOT DETECTED
C Diff toxin: NEGATIVE

## 2019-09-10 LAB — MAGNESIUM: Magnesium: 1.4 mg/dL — ABNORMAL LOW (ref 1.7–2.4)

## 2019-09-10 MED ORDER — SODIUM CHLORIDE 0.9% FLUSH
10.0000 mL | Freq: Once | INTRAVENOUS | Status: AC
  Administered 2019-09-10: 10 mL via INTRAVENOUS
  Filled 2019-09-10: qty 10

## 2019-09-10 MED ORDER — POTASSIUM CHLORIDE CRYS ER 20 MEQ PO TBCR: 20.0000 meq | EXTENDED_RELEASE_TABLET | Freq: Two times a day (BID) | ORAL | 0 refills | Status: DC

## 2019-09-10 MED ORDER — SODIUM CHLORIDE 0.9 % IV SOLN
Freq: Once | INTRAVENOUS | Status: AC
  Administered 2019-09-10: 12:00:00 via INTRAVENOUS
  Filled 2019-09-10: qty 250

## 2019-09-10 MED ORDER — HEPARIN SOD (PORK) LOCK FLUSH 100 UNIT/ML IV SOLN
500.0000 [IU] | Freq: Once | INTRAVENOUS | Status: AC
  Administered 2019-09-10: 500 [IU] via INTRAVENOUS

## 2019-09-10 NOTE — Progress Notes (Signed)
Symptom Management [place]  [phone] [phone]  Patient Care Team: Delsa Grana, PA-C as PCP - General (Family Medicine) Mellody Drown, MD as Consulting Physician (Obstetrics and Gynecology) [name], MD as Consulting Physician (Gastroenterology) Michael Boston, MD as Consulting Physician (General Surgery) Lovell Sheehan, MD as Consulting Physician (Orthopedic Surgery) Cammie Sickle, MD as Consulting Physician (Oncology) Earnestine Leys, MD as Consulting Physician (Orthopedic Surgery)   Name of the patient: Joanna Hall  [id number]  May 27, 1966   Date of visit: 09/10/19  Diagnosis-metastatic cervical cancer  Chief complaint/ Reason for visit-nausea, foul-smelling diarrhea, poor oral intake  Heme/Onc history:  Oncology History Overview Note  # dec 2017- CERVICAL ADENO CA [[place]]; Stage IB [Dr. Christene Slates at [facility] in [place], [place];No surgery- Chemo-RT;   # 2018-sep - lung nodules [in [place]]  # AUG 20th, 2019-#1 Carbo-Taxol s/p 6 cycles- [No avastin sec to blood clots]: Feb 2020-bilateral lung nodules with 1 cm in size. March 2020- finished carbo-taxol #7; poor tolerance to [place].  # April 15 th 2020- Start Taxol weekly x 3; one week OFF;  # July 2020-CT- bil cavitary lesions- slightly bigger by few mm/overall STABLE; CT a/p-NED. 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  She received treatment from 11/2016-05/2017.  01/2017 cisplatin x 2 and carboplatin x 1 (01/29/2017) due to ARF and XRT.  XRT was followed by T & O on 02/01/2017 and T & N 02/10/2017 and 02/20/2017.  Course was complicated by 80 pound weight loss, nausea, vomiting, electrolyte wasting (potassium and magnesium).  She describes that.  Is been sick constantly requiring at least 20 hospitalizations.  Follow-up CT chest and PET on 06/2017. Per patient, 'radiation worked' and no disease in the abdomen.  At that time she was noted to have lung nodules that were growing and follow-up imaging was scheduled for 10/2017.  She was admitted to hospital in [place] for small bowel obstruction which was managed conservatively and she was home for a week prior to traveling to [place] for Thanksgiving holiday where she has family.  She presented to ER in [place] on 08/2017 with nausea, vomiting, and lower abdominal pain.  Symptoms did not respond to conservative treatment.   CT on 08/26/2017 revealed small bowel obstruction with transition in the pelvis just superior to the uterus rather was a long segment of distal ileum with fatty wall thickening compatible with chronic inflammation and/or radiation enteritis. Imaging showed numerous pulmonary nodules consistent with metastatic disease. She underwent laparotomy and right ileocolectomy on 08/31/2017 at [facility].  Surgical findings revealed a thickened, matted, and scarred piece of distal small bowel close to the ileocecal valve.  She was discharged on 09/05/2017.  Pain markedly increased in intensity and imaging was performed on 09/11/2017 which revealed: Debris within the anterior abdominal wall incision concerning for infection versus packing material, s/p post ileo-colectomy with expected postoperative changes, mild colonic ileus, numerous pulmonary nodules highly concerning for metastatic disease, punctate  nonobstructing  nephrolithiasis.  Staples were removed and one was packed.  She was started on doxycycline.  Abdominal and pelvic CT without contrast on 09/11/2017 revealed debris within anterior abdominal wall incision concerning for infection, versus packing material.   She is s/p ileocolectomy with expected postoperative changes and mild colonic ileus.  There were numerous pulmonary nodules highly concerning for metastatic disease and punctate nonobstructing nephrolithiasis. She was readmitted on 09/12/2017.  She describes the onset of lower abdominal pain on 09/09/2017.  Pain markedly increased in intensity on 09/11/2017.   Staples were removed and the wound packed.  She was started on doxycycline.  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   PET scan was ordered to evaluate enlarging lung nodules with concern for recurrent cervical cancer but scan was delayed due to insurance and need to be performed in [place].  Presented to ER on 12/03/2017 for abdominal pain and emesis.  Imaging concerning for worsening possible uterine infarct and she was admitted to hospital.  Pelvic MRI was unremarkable.  Remote scarring type changes of uterus thought to be possibly related to radiation.  She was discharged on 12/08/2017.  Underwent endoscopy and colonoscopy on 12/20/2017.    On 02/22/2018 she underwent laser ablation of condylomata around the anus and vagina under anesthesia with Dr. Johney Maine.   04/15/2018:  Chest, abdomen, and pelvis CT revealed innumerable (> 100) cavitary nodules scattered in the lungs, moderately enlarging compared to the  11/08/2017 PET-CT, suspicious for metastatic disease.  One index node in the RLL measures 1.0 x 1.1 cm (previously 0.6 x 0.6 cm).  There were no new nodules.  There was an ill-defined wall thickening in the rectosigmoid with surrounding stranding along fascia planes, probably sequela from prior radiation therapy.  There was multilevel lumbar impingement due to spondylosis and degenerative disc disease.  There was heterogeneous enhancement in the uterus (some possibly from prior radiation therapy).   04/23/2018:  PET scan revealed numerous scattered solid and cavitary nodules in the lungs stable increased in size compared to the prior PET-CT from 11/08/2017. Largest nodule was 1.1 cm in the LUL (SUV 1.9).  These demonstrated low-grade metabolic activity up to a maximum SUV of about 2.3, increased from 11/08/2017.    Case was discussed at tumor board on 04/25/2018. Consensus to pursue CT-guided biopsy (05/06/18) which revealed: Metastatic adenocarcinoma, morphologically consistent with cervical adenocarcinoma.  She has history of chronic hepatitis C which is managed by GI.  Hepatitis C genotype is 2a/2c.  She receives B12 injections for history of B12 deficiency.  On 04/24/2018 she underwent left total knee replacement with Dr. [name]. --------------------------------------------------------   DIAGNOSIS: Cervical adenocarcinoma  STAGE: IV        ;GOALS: Palliative  CURRENT/MOST RECENT THERAPY : Alimta    Malignant neoplasm of overlapping sites of cervix ([place])  01/29/2019 - 04/23/2019 Chemotherapy   The patient had PACLitaxel (TAXOL) 156 mg in sodium chloride 0.9 % 250 mL chemo infusion (</= 33m/m2), 80 mg/m2 = 156 mg, Intravenous,  Once, 3 of 4 cycles Dose modification: 65 mg/m2 (original dose 80 mg/m2, Cycle 1, Reason: Provider Judgment) Administration: 156 mg (01/29/2019), 156 mg (02/05/2019), 126  mg (02/12/2019), 126 mg (02/26/2019), 126 mg (03/13/2019), 126 mg (03/27/2019), 126 mg (03/20/2019), 126 mg  (04/03/2019), 126 mg (04/10/2019)  for chemotherapy treatment.    05/08/2019 - 07/30/2019 Chemotherapy   The patient had pembrolizumab (KEYTRUDA) 200 mg in sodium chloride 0.9 % 50 mL chemo infusion, 200 mg, Intravenous, Once, 4 of 6 cycles Administration: 200 mg (05/08/2019), 200 mg (05/29/2019), 200 mg (06/19/2019), 200 mg (07/10/2019)  for chemotherapy treatment.    08/07/2019 -  Chemotherapy   The patient had pegfilgrastim-cbqv [facility]) injection 6 mg, 6 mg, Subcutaneous, Once, 1 of 5 cycles Administration: 6 mg (08/29/2019) PEMEtrexed (ALIMTA) 1,000 mg in sodium chloride 0.9 % 100 mL chemo infusion, 950 mg, Intravenous,  Once, 2 of 6 cycles Administration: 1,000 mg (08/07/2019), 1,000 mg (08/28/2019)  for chemotherapy treatment.      Interval history- Neliah Cuyler, 53 year old female with above history of metastatic cervical cancer, presents to symptom management clinic for complaints of nausea, foul-smelling diarrhea, and poor oral intake.  She was seen in [facility] ER on 09/01/2019 for nausea and vomiting. Review of notes show that D-dimer was elevated but thought to be secondary to metastatic disease in her lungs.  Liver enzymes are elevated, total bilirubin was elevated and urine was noted to be dark in color.  CTA of the abdomen shows common bile duct is dilated without obvious obstruction, mild wall thickening of the sigmoid colon, chest x-ray was negative.  CTA of the chest shows multiple pulmonary nodules but no PE, pneumonia, or edema.  She received pain medication (Dilaudid) and IV fluids and was discharged home.  Today, she reports that since being seen in the ER she got better than approximately 3 days ago began having horribly foul-smelling diarrhea which she describes as orange in color and watery.  Endorses fecal urgency.  Denies incontinence.  Has not taken anything for her symptoms.  Additionally, she has ongoing nausea.  Says she took Phenergan this morning.  Says her  oral intake is reduced but she has been able to tolerate fluids and solids.  Denies dizziness, recent fevers or illness, easy bleeding or bruising.  Endorses weight loss.  Denies chest pain.  Denies urinary complaints.  No further specific complaints today.  Says she has been compliant with hydrocortisone.  ECOG FS:2 - Symptomatic, <50% confined to bed  Review of systems- Review of Systems  Constitutional: Positive for malaise/fatigue and weight loss. Negative for chills and fever.  HENT: Negative for hearing loss, nosebleeds, sore throat and tinnitus.   Eyes: Negative for blurred vision and double vision.  Respiratory: Negative for cough, hemoptysis, shortness of breath and wheezing.   Cardiovascular: Negative for chest pain, palpitations and leg swelling.  Gastrointestinal: Positive for abdominal pain, diarrhea, nausea and vomiting. Negative for blood in stool, constipation and melena.  Genitourinary: Negative for dysuria and urgency.  Musculoskeletal: Negative for back pain, falls, joint pain and myalgias.  Skin: Negative for itching and rash.  Neurological: Negative for dizziness, tingling, sensory change, loss of consciousness, weakness and headaches.  Endo/Heme/Allergies: Negative for environmental allergies. Does not bruise/bleed easily.  Psychiatric/Behavioral: Negative for depression. The patient is not nervous/anxious and does not have insomnia.      Current treatment-Alimta (08/28/2019)  Allergies  Allergen Reactions   Ketamine Anxiety and Other (See Comments)    Syncope episode/confusion     Past Medical History:  Diagnosis Date   Abdominal pain 06/10/2018   Abnormal cervical Papanicolaou smear 09/18/2017   Anxiety    Aortic atherosclerosis (  [place])    Arthritis    neck and knees   Blood clots in brain    both lungs and right kidney   Blood transfusion without reported diagnosis    Cervical cancer ([place]) 09/2016   mets lung   Chronic anal fissure    Chronic  diarrhea    Dyspnea    Erosive gastropathy 09/18/2017   Factor V Leiden mutation ([place])    Fecal incontinence    Genital warts    GERD (gastroesophageal reflux disease)    GI bleed 10/08/2018   Heart murmur    Hematochezia    Hemorrhoids    Hepatitis C    Chronic, 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   Intestinal infection due to Clostridium difficile 09/18/2017   Macrocytic anemia with vitamin B12 deficiency    Multiple gastric ulcers    Nausea vomiting and diarrhea    Pancolitis ([place]) 07/27/2018   Perianal condylomata    Pneumonia    History of   Pulmonary nodules    Rectal bleeding    Small bowel obstruction ([place]) 08/2017   Stiff neck    limited right turn   Vitamin D deficiency     Past Surgical History:  Procedure Laterality Date   CHOLECYSTECTOMY     COLON SURGERY  08/2017   resection   COLONOSCOPY WITH PROPOFOL N/A 12/20/2017   Procedure: COLONOSCOPY WITH PROPOFOL;  Surgeon: [name], MD;  Location: [place] [facility];  Service: Gastroenterology;  Laterality: N/A;   COLONOSCOPY WITH PROPOFOL N/A 07/30/2018   Procedure: COLONOSCOPY WITH PROPOFOL;  Surgeon: [name], MD;  Location[facility] ENDOSCOPY;  Service: Gastroenterology;  Laterality: N/A;   COLONOSCOPY WITH PROPOFOL N/A 10/10/2018   Procedure: COLONOSCOPY WITH PROPOFOL;  Surgeon: Lucilla Lame, MD;  Location: [facility] ENDOSCOPY;  Service: Endoscopy;  Laterality: N/A;   DIAGNOSTIC LAPAROSCOPY     ESOPHAGOGASTRODUODENOSCOPY (EGD) WITH PROPOFOL N/A 12/20/2017   Procedure: ESOPHAGOGASTRODUODENOSCOPY (EGD) WITH PROPOFOL;  Surgeon: [name], MD;  Location: [facility] ENDOSCOPY;  Service:  Gastroenterology;  Laterality: N/A;   ESOPHAGOGASTRODUODENOSCOPY (EGD) WITH PROPOFOL  07/30/2018   Procedure: ESOPHAGOGASTRODUODENOSCOPY (EGD) WITH PROPOFOL;  Surgeon: [name], MD;  Location: [facility] ENDOSCOPY;  Service: Gastroenterology;;   [place] N/A 11/21/2018   Procedure: FLEXIBLE SIGMOIDOSCOPY;  Surgeon: [name], MD;  Location: [facility] ENDOSCOPY;  Service: Gastroenterology;  Laterality: N/A;   LAPAROTOMY N/A 08/31/2017   Procedure: EXPLORATORY LAPAROTOMY for SBO, ileocolectomy, removal of piece of uterine wall;  Surgeon: [name], MD;  Location: [facility] ORS;  Service: General;  Laterality: N/A;   LASER ABLATION CONDOLAMATA N/A 02/22/2018   Procedure: LASER ABLATION/REMOVAL OF [id number] AROUND ANUS AND VAGINA;  Surgeon: Michael Boston, MD;  Location: [place];  Service: General;  Laterality: N/A;   OOPHORECTOMY     PORTA CATH INSERTION N/A 05/13/2018   Procedure: PORTA CATH INSERTION;  Surgeon: Katha Cabal, MD;  Location: [place] CV LAB;  Service: Cardiovascular;  Laterality: N/A;   SMALL INTESTINE SURGERY     TANDEM RING INSERTION     x3   THORACOTOMY     TOTAL KNEE ARTHROPLASTY Left 04/24/2018   Procedure: TOTAL KNEE ARTHROPLASTY;  Surgeon: Lovell Sheehan, MD;  Location: [facility] ORS;  Service: Orthopedics;  Laterality: Left;    Social History   Socioeconomic History   Marital status: Divorced    Spouse name: Not on file   Number of children: Not on file   Years of education: Not on file   Highest education level: Not on file  Occupational History   Not on file  Social Needs   Financial resource strain: Not hard at all   Food insecurity    Worry: Never true    Inability: Never true   Transportation needs    Medical: No    Non-medical: No  Tobacco Use   Smoking status: Former Smoker    Packs/day: 0.25    Years: 10.00    Pack years: 2.50    Types: Cigarettes    Quit date: 10/16/2006    Years  since quitting: 12.9   Smokeless tobacco: Never Used  Substance and Sexual Activity   Alcohol use: Not Currently    Frequency: Never    Comment: seldom   Drug use: Yes    Types: Marijuana    Comment: not very often   Sexual activity: Not Currently    Birth control/protection: Post-menopausal    Comment: Not Asked  Lifestyle   Physical activity    Days per week: Patient refused    Minutes per session: Patient refused   Stress: Only a little  Relationships   Press photographer on phone: Patient refused    Gets together: Patient refused    Attends religious service: Patient refused    Active member of club or organization: Patient refused    Attends meetings of clubs or organizations: Patient refused    Relationship status: Patient refused   Intimate partner violence    Fear of current or ex partner: No    Emotionally abused: No    Physically abused: No    Forced sexual activity: No  Other Topics Concern   Not on file  Social History Narrative   Not on file    Family History  Problem Relation Age of Onset   Hypertension Father    Diabetes Father    Alcohol abuse Daughter    Hypertension Maternal Grandmother    Diabetes Maternal Grandmother    Diabetes Paternal Grandmother    Hypertension Paternal Grandmother      Current Outpatient Medications:    amitriptyline (ELAVIL) 75 MG tablet, Take 1 tablet (75 mg total) by mouth at bedtime., Disp: 90 tablet, Rfl: 1   Calcium Carb-Cholecalciferol (CALCIUM 500 +D) 500-400 MG-UNIT TABS, Take 2 tablets by mouth daily., Disp: , Rfl:    colchicine 0.6 MG tablet, Take 0.6 mg by mouth 2 (two) times daily., Disp: , Rfl:    dexamethasone (DECADRON) 4 MG tablet, Take one pill AM & PM x 3 days; start the day prior to chemo., Disp: 60 tablet, Rfl: 0   diphenoxylate-atropine (LOMOTIL) 2.5-0.025 MG tablet, TAKE (2) TABLETS FOUR TIMES DAILY., Disp: 240 tablet, Rfl: 0   famotidine (PEPCID) 20 MG tablet, Take 1  tablet (20 mg total) by mouth 2 (two) times daily for 5 days. (Patient taking differently: Take 20 mg by mouth 2 (two) times daily as needed. ), Disp: 10 tablet, Rfl: 0   folic acid (FOLVITE) 1 MG tablet, Take 1 tablet (1 mg total) by mouth daily., Disp: 30 tablet, Rfl: 6   hydrocortisone (CORTEF) 20 MG tablet, Take 1 tablet (20 mg total) by mouth  daily., Disp: 30 tablet, Rfl: 0   hydrOXYzine (ATARAX/VISTARIL) 10 MG tablet, Take 1 tablet (10 mg total) by mouth 3 (three) times daily as needed., Disp: 60 tablet, Rfl: 0   hyoscyamine (LEVBID) 0.375 MG 12 hr tablet, Take 1 tablet (0.375 mg total) by mouth 2 (two) times daily., Disp: 60 tablet, Rfl: 0   ondansetron (ZOFRAN ODT) 4 MG disintegrating tablet, Take 1 tablet (4 mg total) by mouth every 8 (eight) hours as needed for nausea or vomiting., Disp: 30 tablet, Rfl: 2   oxyCODONE (OXYCONTIN) 15 mg 12 hr tablet, Take 1 tablet (15 mg total) by mouth every 8 (eight) hours., Disp: 90 tablet, Rfl: 0   Oxycodone HCl 10 MG TABS, Take 1 tablet (10 mg total) by mouth every 6 (six) hours as needed (for breakthrough pain)., Disp: 120 tablet, Rfl: 0   pantoprazole (PROTONIX) 40 MG tablet, Take 1 tablet (40 mg total) by mouth daily., Disp: 90 tablet, Rfl: 1   potassium chloride SA (K-DUR) 20 MEQ tablet, Take 1 tablet (20 mEq total) by mouth daily as needed (if having nausea/vomiting- to supplements for lost potassium)., Disp: 30 tablet, Rfl: 0   promethazine (PHENERGAN) 25 MG tablet, Take 1 tablet (25 mg total) by mouth every 8 (eight) hours as needed for nausea or vomiting., Disp: 30 tablet, Rfl: 6   rivaroxaban (XARELTO) 20 MG TABS tablet, Take 1 tablet (20 mg total) by mouth daily with supper. (Patient taking differently: Take 20 mg by mouth daily with supper. Takes in the morning), Disp: 30 tablet, Rfl: 6   sertraline (ZOLOFT) 25 MG tablet, Take 25 mg PO before bedtime daily for 1 week, then take 50 mg (2 tabs) PO before bedtime daily, Disp: 60 tablet,  Rfl: 1   traMADol (ULTRAM) 50 MG tablet, Take 50 mg by mouth every 6 (six) hours as needed., Disp: , Rfl:    VENTOLIN HFA 108 (90 Base) MCG/ACT inhaler, Inhale 1-2 puffs into the lungs every 4 (four) hours as needed for shortness of breath., Disp: 1 Inhaler, Rfl: 1 No current facility-administered medications for this visit.   Facility-Administered Medications Ordered in Other Visits:    heparin lock flush 100 unit/mL, 500 Units, Intravenous, Once, Corcoran, Melissa C, MD   sodium chloride 0.9 % 1,000 mL with potassium chloride 20 mEq infusion, , Intravenous, Once, Honor Loh E, NP   sodium chloride flush (NS) 0.9 % injection 10 mL, 10 mL, Intravenous, Once, Borders, Kirt Boys, NP  Physical exam:  Vitals:   09/10/19 1123  BP: 128/88  Pulse: (!) 108  Resp: 18  Temp: (!) 97.2 F (36.2 C)  TempSrc: Tympanic  Weight: 170 lb 9.6 oz (77.4 kg)   Physical Exam   CMP Latest Ref Rng & Units 09/10/2019  Glucose 70 - 99 mg/dL 94  BUN 6 - 20 mg/dL 9  Creatinine 0.44 - 1.00 mg/dL 0.66  Sodium 135 - 145 mmol/L 136  Potassium 3.5 - 5.1 mmol/L 3.0(L)  Chloride 98 - 111 mmol/L 99  CO2 22 - 32 mmol/L 24  Calcium 8.9 - 10.3 mg/dL 8.7(L)  Total Protein 6.5 - 8.1 g/dL 7.2  Total Bilirubin 0.3 - 1.2 mg/dL 1.2  Alkaline Phos 38 - 126 U/L 200(H)  AST 15 - 41 U/L 80(H)  ALT 0 - 44 U/L 58(H)   CBC Latest Ref Rng & Units 09/10/2019  WBC 4.0 - 10.5 K/uL 9.9  Hemoglobin 12.0 - 15.0 g/dL 10.6(L)  Hematocrit 36.0 - 46.0 % 32.5(L)  Platelets  150 - 400 K/uL 153    No images are attached to the encounter.  Ct Abdomen Pelvis W Contrast  Result Date: 08/13/2019 CLINICAL DATA:  Abdominal pain EXAM: CT ABDOMEN AND PELVIS WITH CONTRAST TECHNIQUE: Multidetector CT imaging of the abdomen and pelvis was performed using the standard protocol following bolus administration of intravenous contrast. CONTRAST:  135m OMNIPAQUE IOHEXOL 300 MG/ML  SOLN COMPARISON:  07/22/2019 FINDINGS: Lower chest: Numerous  bibasilar cavitary nodules are again seen and are similar prior study. The largest in the right lower lobe measures 16 mm, stable. Hepatobiliary: Prior cholecystectomy. Mild intrahepatic and extrahepatic biliary ductal dilatation. Common bile duct measures up to 13 mm, stable. This is likely related to post cholecystectomy state. No focal hepatic abnormality. Pancreas: No focal abnormality or ductal dilatation. Spleen: No focal abnormality.  Normal size. Adrenals/Urinary Tract: No adrenal abnormality. No focal renal abnormality. No stones or hydronephrosis. Urinary bladder is unremarkable. Stomach/Bowel: Stomach, large and small bowel grossly unremarkable. Vascular/Lymphatic: Aortic atherosclerosis. No enlarged abdominal or pelvic lymph nodes. Reproductive: Uterus and adnexa unremarkable.  No mass. Other: No free fluid or free air. Musculoskeletal: No acute bony abnormality. IMPRESSION: Numerous bilateral lower lobe cavitary nodules, stable since prior study. Stable intrahepatic and extrahepatic biliary ductal dilatation, likely related to post cholecystectomy state. Aortic atherosclerosis. No acute findings in the abdomen or pelvis. Electronically Signed   By: KRolm BaptiseM.D.   On: 08/13/2019 11:07    Assessment and plan- Patient is a 53y.o. female diagnosed with metastatic cervical cancer, currently on Alimta with palliative intent, who presents to symptom management clinic for diarrhea, abdominal pain, vomiting.  1.  Diarrhea-stool studies today were negative for C. difficile and negative GI panel.  Etiology unclear but patient has chronic history of diarrhea.  Continue antidiarrheals.  If symptoms worsen, please notify clinic.  2.  Vomiting- etiology unclear.  1 episode in clinic.  IV fluids and antiemetics given.  Tolerating solids and liquids in clinic.   3.  Abdominal pain- chronic.  Urine negative for infection.  Likely secondary to metastatic disease.  Currently well controlled.  Will check UDS  today.  Continue follow-up with outpatient palliative care for ongoing management.  4.  Hypokalemia-acute on chronic.  Potassium 3.0 today.  Likely secondary to poor oral intake and GI losses.  Continue potassium supplementation.  Refill provided today.  5.  Hypomagnesemia-magnesium 1.4 today.  Likely secondary to GI losses.  Continue magnesium supplementation as tolerated.  Case and findings discussed with primary oncologist, Dr. BRogue Bussingwho contributed to and agreed with plan of care.   Disposition:  Return to clinic if symptoms don't improve or worsen. ER precautions provided. Follow up with Dr. BRogue Bussingas scheduled.   Visit Diagnosis 1. Diarrhea, unspecified type   2. Non-intractable vomiting with nausea, unspecified vomiting type   3. Neoplasm related pain   4. Malignant neoplasm of overlapping sites of cervix ([place]   5. Hypokalemia due to excessive gastrointestinal loss of potassium   6. Hypomagnesemia     Patient expressed understanding and was in agreement with this plan. She also understands that She can call clinic at any time with any questions, concerns, or complaints.   Thank you for allowing me to participate in the care of this pleasant patient.   LBeckey Rutter DNP, AGNP-C [phone](clinic)  CC: Dr. BRogue Bussing

## 2019-09-10 NOTE — Progress Notes (Signed)
Patient here for symptom management. Has not ate in 4 days, but has been drinking fluids to prevent dehydration. She says that she stays nauseated most of the time and is having diarrhea with a "nasty smell." She was in ER in [place] last week because she wasn't feeling well and was given pain meds and fluids.

## 2019-09-16 ENCOUNTER — Emergency Department
Admission: EM | Admit: 2019-09-16 | Discharge: 2019-09-16 | Disposition: A | Discharge status: Home / Self Care | Payer: Medicaid Other | Attending: Emergency Medicine | Admitting: Emergency Medicine

## 2019-09-16 ENCOUNTER — Emergency Department: Payer: Medicaid Other

## 2019-09-16 ENCOUNTER — Other Ambulatory Visit: Payer: Self-pay | Admitting: Hospice and Palliative Medicine

## 2019-09-16 ENCOUNTER — Encounter: Payer: Self-pay | Admitting: Emergency Medicine

## 2019-09-16 ENCOUNTER — Other Ambulatory Visit: Payer: Self-pay

## 2019-09-16 DIAGNOSIS — Z515 Encounter for palliative care: Secondary | ICD-10-CM

## 2019-09-16 DIAGNOSIS — C7800 Secondary malignant neoplasm of unspecified lung: Secondary | ICD-10-CM

## 2019-09-16 DIAGNOSIS — R112 Nausea with vomiting, unspecified: Secondary | ICD-10-CM | POA: Insufficient documentation

## 2019-09-16 DIAGNOSIS — Z8541 Personal history of malignant neoplasm of cervix uteri: Secondary | ICD-10-CM | POA: Insufficient documentation

## 2019-09-16 DIAGNOSIS — F1210 Cannabis abuse, uncomplicated: Secondary | ICD-10-CM | POA: Insufficient documentation

## 2019-09-16 DIAGNOSIS — I10 Essential (primary) hypertension: Secondary | ICD-10-CM | POA: Insufficient documentation

## 2019-09-16 DIAGNOSIS — Z87891 Personal history of nicotine dependence: Secondary | ICD-10-CM | POA: Diagnosis not present

## 2019-09-16 DIAGNOSIS — C539 Malignant neoplasm of cervix uteri, unspecified: Secondary | ICD-10-CM

## 2019-09-16 DIAGNOSIS — Z9221 Personal history of antineoplastic chemotherapy: Secondary | ICD-10-CM | POA: Insufficient documentation

## 2019-09-16 DIAGNOSIS — R1084 Generalized abdominal pain: Secondary | ICD-10-CM | POA: Diagnosis not present

## 2019-09-16 DIAGNOSIS — R109 Unspecified abdominal pain: Secondary | ICD-10-CM | POA: Diagnosis present

## 2019-09-16 LAB — CBC WITH DIFFERENTIAL/PLATELET
Abs Immature Granulocytes: 0.17 10*3/uL — ABNORMAL HIGH (ref 0.00–0.07)
Basophils Absolute: 0 10*3/uL (ref 0.0–0.1)
Basophils Relative: 0 %
Eosinophils Absolute: 0 10*3/uL (ref 0.0–0.5)
Eosinophils Relative: 1 %
HCT: 33.6 % — ABNORMAL LOW (ref 36.0–46.0)
Hemoglobin: 11.2 g/dL — ABNORMAL LOW (ref 12.0–15.0)
Immature Granulocytes: 2 %
Lymphocytes Relative: 9 %
Lymphs Abs: 0.8 10*3/uL (ref 0.7–4.0)
MCH: 34 pg (ref 26.0–34.0)
MCHC: 33.3 g/dL (ref 30.0–36.0)
MCV: 102.1 fL — ABNORMAL HIGH (ref 80.0–100.0)
Monocytes Absolute: 0.6 10*3/uL (ref 0.1–1.0)
Monocytes Relative: 7 %
Neutro Abs: 7.3 10*3/uL (ref 1.7–7.7)
Neutrophils Relative %: 81 %
Platelets: 229 10*3/uL (ref 150–400)
RBC: 3.29 MIL/uL — ABNORMAL LOW (ref 3.87–5.11)
RDW: 16 % — ABNORMAL HIGH (ref 11.5–15.5)
WBC: 8.9 10*3/uL (ref 4.0–10.5)
nRBC: 0 % (ref 0.0–0.2)

## 2019-09-16 LAB — COMPREHENSIVE METABOLIC PANEL
ALT: 97 U/L — ABNORMAL HIGH (ref 0–44)
AST: 112 U/L — ABNORMAL HIGH (ref 15–41)
Albumin: 3.4 g/dL — ABNORMAL LOW (ref 3.5–5.0)
Alkaline Phosphatase: 176 U/L — ABNORMAL HIGH (ref 38–126)
Anion gap: 13 (ref 5–15)
BUN: 13 mg/dL (ref 6–20)
CO2: 22 mmol/L (ref 22–32)
Calcium: 8.6 mg/dL — ABNORMAL LOW (ref 8.9–10.3)
Chloride: 103 mmol/L (ref 98–111)
Creatinine, Ser: 0.81 mg/dL (ref 0.44–1.00)
GFR calc Af Amer: >60 mL/min (ref >60)
GFR calc non Af Amer: >60 mL/min (ref >60)
Glucose, Bld: 150 mg/dL — ABNORMAL HIGH (ref 70–99)
Potassium: 3.3 mmol/L — ABNORMAL LOW (ref 3.5–5.1)
Sodium: 138 mmol/L (ref 135–145)
Total Bilirubin: 0.7 mg/dL (ref 0.3–1.2)
Total Protein: 7.1 g/dL (ref 6.5–8.1)

## 2019-09-16 LAB — LIPASE, BLOOD: Lipase: 26 U/L (ref 11–51)

## 2019-09-16 LAB — TROPONIN I (HIGH SENSITIVITY): Troponin I (High Sensitivity): 6 ng/L (ref <18)

## 2019-09-16 MED ORDER — LORAZEPAM 2 MG/ML IJ SOLN
0.5000 mg | Freq: Once | INTRAMUSCULAR | Status: AC
  Administered 2019-09-16: 0.5 mg via INTRAVENOUS
  Filled 2019-09-16: qty 1

## 2019-09-16 MED ORDER — OXYCODONE HCL ER 20 MG PO T12A: 20.0000 mg | EXTENDED_RELEASE_TABLET | Freq: Three times a day (TID) | ORAL | 0 refills | Status: DC

## 2019-09-16 MED ORDER — ONDANSETRON HCL 4 MG/2ML IJ SOLN
4.0000 mg | Freq: Once | INTRAMUSCULAR | Status: AC
  Administered 2019-09-16: 4 mg via INTRAVENOUS
  Filled 2019-09-16: qty 2

## 2019-09-16 MED ORDER — OXYCODONE HCL 10 MG PO TABS: 10.0000 mg | ORAL_TABLET | Freq: Four times a day (QID) | ORAL | 0 refills | Status: DC | PRN

## 2019-09-16 MED ORDER — HYDROMORPHONE HCL 1 MG/ML IJ SOLN
1.0000 mg | Freq: Once | INTRAMUSCULAR | Status: AC
  Administered 2019-09-16: 1 mg via INTRAVENOUS
  Filled 2019-09-16: qty 1

## 2019-09-16 MED ORDER — HYDROMORPHONE HCL 1 MG/ML IJ SOLN
1.0000 mg | Freq: Once | INTRAMUSCULAR | Status: AC
  Administered 2019-09-16: 08:00:00 1 mg via INTRAVENOUS
  Filled 2019-09-16: qty 1

## 2019-09-16 NOTE — Consult Note (Signed)
[place]  Telephone:([phone] [phone]   Name: Joanna Hall Date: 09/16/2019 MRN: [id number]  DOB: 1966-03-03  Patient Care Team: Delsa Grana, PA-C as PCP - General (Family Medicine) Mellody Drown, MD as Consulting Physician (Obstetrics and Gynecology) [name], MD as Consulting Physician (Gastroenterology) Michael Boston, MD as Consulting Physician (General Surgery) Lovell Sheehan, MD as Consulting Physician (Orthopedic Surgery) Cammie Sickle, MD as Consulting Physician (Oncology) Earnestine Leys, MD as Consulting Physician (Orthopedic Surgery)    [place]: Palliative Care consult requested for [phone] y.o.femalewith multiple medical problems including 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 Patient was recently hospitalized 07/22/2019-07/24/2019 with abdominal pain and constipation.  She was hospitalized again 08/13/2019-08/19/2019 with intractable abdominal pain.  This is patient's sixth hospitalization in the past three months. Patient has also had multiple ER visits for nausea and abdominal pain. She presented to the ER on 09/16/2019 with same. Palliative care was consulted to help address symptoms and goals and manage ongoing symptoms.  SOCIAL HISTORY:     reports that she quit smoking about 12 years ago. Her smoking use included cigarettes. She has a 2.50 pack-year smoking history. She has never used smokeless tobacco. She reports previous alcohol use. She reports current drug use. Drug: Marijuana.   Patient is not married. She lives at home with her mother and father. She has an adult daughter who lives in [place]. She had another  daughter who is now deceased.  ADVANCE DIRECTIVES:  Not on file  CODE STATUS: DNR  PAST MEDICAL HISTORY: Past Medical History:  Diagnosis Date   Abdominal pain 06/10/2018   Abnormal cervical Papanicolaou smear 09/18/2017   Anxiety    Aortic atherosclerosis (HCC)    Arthritis    neck and knees   Blood clots in brain    both lungs and right kidney   Blood transfusion without reported diagnosis    Cervical cancer ([place]) 09/2016   mets lung   Chronic anal fissure    Chronic diarrhea    Dyspnea    Erosive gastropathy 09/18/2017   Factor V Leiden mutation ([place])    Fecal incontinence    Genital warts    GERD (gastroesophageal reflux disease)    GI bleed 10/08/2018   Heart murmur    Hematochezia    Hemorrhoids    Hepatitis C    Chronic, 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   Intestinal infection due to Clostridium difficile 09/18/2017   Macrocytic anemia with vitamin B12 deficiency    Multiple gastric ulcers    Nausea vomiting and diarrhea    Pancolitis ([place]) 07/27/2018   Perianal condylomata    Pneumonia    History of   Pulmonary nodules    Rectal bleeding    Small bowel obstruction ([place]) 08/2017   Stiff neck    limited right turn   Vitamin D deficiency     PAST SURGICAL HISTORY:  Past Surgical History:  Procedure Laterality Date   CHOLECYSTECTOMY     COLON SURGERY  08/2017   resection   COLONOSCOPY WITH PROPOFOL N/A 12/20/2017   Procedure: COLONOSCOPY WITH PROPOFOL;  Surgeon: [name], MD;  Location: [facility] ENDOSCOPY;  Service: Gastroenterology;  Laterality: N/A;   COLONOSCOPY WITH PROPOFOL N/A 07/30/2018   Procedure: COLONOSCOPY WITH PROPOFOL;   Surgeon: [name], MD;  Location[facility] ENDOSCOPY;  Service: Gastroenterology;  Laterality: N/A;   COLONOSCOPY WITH PROPOFOL N/A 10/10/2018   Procedure: COLONOSCOPY WITH PROPOFOL;  Surgeon: Lucilla Lame, MD;  Location: [facility] ENDOSCOPY;  Service: Endoscopy;  Laterality: N/A;   DIAGNOSTIC LAPAROSCOPY     ESOPHAGOGASTRODUODENOSCOPY (EGD) WITH PROPOFOL N/A 12/20/2017   Procedure: ESOPHAGOGASTRODUODENOSCOPY (EGD) WITH PROPOFOL;  Surgeon: [name], MD;  Location: [facility] ENDOSCOPY;  Service: Gastroenterology;  Laterality: N/A;   ESOPHAGOGASTRODUODENOSCOPY (EGD) WITH PROPOFOL  07/30/2018   Procedure: ESOPHAGOGASTRODUODENOSCOPY (EGD) WITH PROPOFOL;  Surgeon: [name], MD;  Location: [facility] ENDOSCOPY;  Service: Gastroenterology;;   [place] N/A 11/21/2018   Procedure: FLEXIBLE SIGMOIDOSCOPY;  Surgeon: [name], MD;  Location[facility] ENDOSCOPY;  Service: Gastroenterology;  Laterality: N/A;   LAPAROTOMY N/A 08/31/2017   Procedure: EXPLORATORY LAPAROTOMY for SBO, ileocolectomy, removal of piece of uterine wall;  Surgeon: [name], MD;  Location: [facility] ORS;  Service: General;  Laterality: N/A;   LASER ABLATION CONDOLAMATA N/A 02/22/2018   Procedure: LASER ABLATION/REMOVAL OF [id number] AROUND ANUS AND VAGINA;  Surgeon: Michael Boston, MD;  Location: [place];  Service: General;  Laterality: N/A;   OOPHORECTOMY     PORTA CATH INSERTION N/A 05/13/2018   Procedure: PORTA CATH INSERTION;  Surgeon: Katha Cabal, MD;  Location: [place] CV LAB;  Service: Cardiovascular;  Laterality: N/A;   SMALL INTESTINE SURGERY     TANDEM RING INSERTION     x3   THORACOTOMY     TOTAL KNEE ARTHROPLASTY Left 04/24/2018   Procedure: TOTAL KNEE ARTHROPLASTY;  Surgeon: Lovell Sheehan, MD;  Location: [facility] ORS;  Service: Orthopedics;  Laterality: Left;    HEMATOLOGY/ONCOLOGY HISTORY:  Oncology History Overview Note  # dec 2017- CERVICAL ADENO  CA [[place]]; Stage IB [Dr. Christene Slates at [facility] in [place], [place];No surgery- Chemo-RT;   # 2018-sep - lung nodules [in [place]]  # AUG 20th, 2019-#1 Carbo-Taxol s/p 6 cycles- [No avastin sec to blood clots]: Feb 2020-bilateral lung nodules with 1 cm in size. March 2020- finished carbo-taxol #7; poor tolerance to [place].  # April 15 th 2020- Start Taxol weekly x 3; one week OFF;  # July 2020-CT- bil cavitary lesions- slightly bigger by few mm/overall STABLE; CT a/p-NED. STOP Taxol;  # July , 23rd 2020- Keytruda [CPS-15 ]x 4 cycles; CT mid OCT 2020-progressive disease in the lungs.;  Stop [facility]  # Oct 22nd Alimta q 3 w  # summer-/fall 2019-Bil PE/kidney infract; factor V Leiden Leiden - xarelto [small bowel obstruction]; moved to [facility]   #Fall 2019 PE/renal infarct on Xarelto-factor V Leiden  # NGS/[facility]- PDL CPS 15; NEG for other targets**  ------------------------------------------------------------- She was treated by  Decision was made to pursue concurrent chemotherapy (weekly cisplatin) and radiation.  She received treatment from 11/2016-05/2017.  01/2017 cisplatin x 2 and carboplatin x 1 (01/29/2017) due to ARF and XRT.  XRT was followed by T & O on 02/01/2017 and T & N 02/10/2017 and 02/20/2017.  Course was complicated by 80 pound weight loss, nausea, vomiting, electrolyte wasting (potassium  and magnesium).  She describes that.  Is been sick constantly requiring at least 20 hospitalizations.  Follow-up CT chest and PET on 06/2017. Per patient, 'radiation worked' and no disease in the abdomen.  At that time she was noted to have lung nodules that were growing and follow-up imaging was scheduled for 10/2017.  She was admitted to hospital in [place] for small bowel obstruction which was managed conservatively and she was home for a week prior to traveling to [place] for Thanksgiving holiday where she has family.  She presented to ER  in [place] on 08/2017 with nausea, vomiting, and lower abdominal pain.  Symptoms did not respond to conservative treatment.   CT on 08/26/2017 revealed small bowel obstruction with transition in the pelvis just superior to the uterus rather was a long segment of distal ileum with fatty wall thickening compatible with chronic inflammation and/or radiation enteritis. Imaging showed numerous pulmonary nodules consistent with metastatic disease. She underwent laparotomy and right ileocolectomy on 08/31/2017 at [facility].  Surgical findings revealed a thickened, matted, and scarred piece of distal small bowel close to the ileocecal valve.  She was discharged on 09/05/2017.  Pain markedly increased in intensity and imaging was performed on 09/11/2017 which revealed: Debris within the anterior abdominal wall incision concerning for infection versus packing material, s/p post ileo-colectomy with expected postoperative changes, mild colonic ileus, numerous pulmonary nodules highly concerning for metastatic disease, punctate nonobstructing nephrolithiasis.  Staples were removed and one was packed.  She was started on doxycycline.  Abdominal and pelvic CT without contrast on 09/11/2017 revealed debris within anterior abdominal wall incision concerning for infection, versus packing material.   She is s/p ileocolectomy with expected postoperative changes and mild colonic ileus.  There were numerous pulmonary nodules highly concerning for metastatic disease and punctate nonobstructing nephrolithiasis. She was readmitted on 09/12/2017.  She describes the onset of lower abdominal pain on 09/09/2017.  Pain markedly increased in intensity on 09/11/2017.   Staples were removed and the wound packed.  She was started on doxycycline.  CT on 09/13/2017 showed postsurgical changes from ileocecectomy with primary ileocolic anastomosis without evidence of abscess or leak, edema small bowel loops of distal ileum, gas within ventral  midline surgical wound corresponding to wound infection versus packing material, small infarct at the inferior pole of left kidney, right uterine infarct.  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   PET scan was ordered to evaluate enlarging lung nodules with concern for recurrent cervical cancer but scan was delayed due to insurance and need to be performed in [place].  Presented to ER on 12/03/2017 for abdominal pain and emesis.  Imaging concerning for worsening possible uterine infarct and she was admitted to hospital.  Pelvic MRI was unremarkable.  Remote scarring type changes of uterus thought to be possibly related to radiation.  She was discharged on 12/08/2017.  Underwent endoscopy and colonoscopy on 12/20/2017.    On 02/22/2018 she underwent laser ablation of condylomata around the anus and vagina under anesthesia with Dr. Johney Maine.   04/15/2018:  Chest, abdomen, and pelvis CT revealed innumerable (> 100) cavitary nodules scattered in the lungs, moderately enlarging compared to the 11/08/2017 PET-CT, suspicious for metastatic disease.  One index node in the RLL measures 1.0 x 1.1 cm (previously 0.6 x 0.6 cm).  There were no new nodules.  There was an ill-defined wall thickening in the rectosigmoid with surrounding stranding along fascia planes, probably sequela  from prior radiation therapy.  There was multilevel lumbar impingement due to spondylosis and degenerative disc disease.  There was heterogeneous enhancement in the uterus (some possibly from prior radiation therapy).   04/23/2018:  PET scan revealed numerous scattered solid and cavitary nodules in the lungs stable increased in size compared to the prior PET-CT from 11/08/2017. Largest nodule was 1.1 cm in the LUL (SUV 1.9).  These demonstrated low-grade metabolic activity up to a maximum SUV of about 2.3, increased from 11/08/2017.    Case was discussed at tumor board on 04/25/2018. Consensus to pursue  CT-guided biopsy (05/06/18) which revealed: Metastatic adenocarcinoma, morphologically consistent with cervical adenocarcinoma.  She has history of chronic hepatitis C which is managed by GI.  Hepatitis C genotype is 2a/2c.  She receives B12 injections for history of B12 deficiency.  On 04/24/2018 she underwent left total knee replacement with Dr. [name]. --------------------------------------------------------   DIAGNOSIS: Cervical adenocarcinoma  STAGE: IV        ;GOALS: Palliative  CURRENT/MOST RECENT THERAPY : Alimta    Malignant neoplasm of overlapping sites of cervix ([place])  01/29/2019 - 04/23/2019 Chemotherapy   The patient had PACLitaxel (TAXOL) 156 mg in sodium chloride 0.9 % 250 mL chemo infusion (</= 55m/m2), 80 mg/m2 = 156 mg, Intravenous,  Once, 3 of 4 cycles Dose modification: 65 mg/m2 (original dose 80 mg/m2, Cycle 1, Reason: Provider Judgment) Administration: 156 mg (01/29/2019), 156 mg (02/05/2019), 126 mg (02/12/2019), 126 mg (02/26/2019), 126 mg (03/13/2019), 126 mg (03/27/2019), 126 mg (03/20/2019), 126 mg (04/03/2019), 126 mg (04/10/2019)  for chemotherapy treatment.    05/08/2019 - 07/30/2019 Chemotherapy   The patient had pembrolizumab (KEYTRUDA) 200 mg in sodium chloride 0.9 % 50 mL chemo infusion, 200 mg, Intravenous, Once, 4 of 6 cycles Administration: 200 mg (05/08/2019), 200 mg (05/29/2019), 200 mg (06/19/2019), 200 mg (07/10/2019)  for chemotherapy treatment.    08/07/2019 -  Chemotherapy   The patient had pegfilgrastim-cbqv ([facility] injection 6 mg, 6 mg, Subcutaneous, Once, 1 of 5 cycles Administration: 6 mg (08/29/2019) PEMEtrexed (ALIMTA) 1,000 mg in sodium chloride 0.9 % 100 mL chemo infusion, 950 mg, Intravenous,  Once, 2 of 6 cycles Administration: 1,000 mg (08/07/2019), 1,000 mg (08/28/2019)  for chemotherapy treatment.      ALLERGIES:  is allergic to ketamine.  MEDICATIONS:  No current facility-administered medications for this encounter.    Current Outpatient  Medications  Medication Sig Dispense Refill   amitriptyline (ELAVIL) 75 MG tablet Take 1 tablet (75 mg total) by mouth at bedtime. 90 tablet 1   Calcium Carb-Cholecalciferol (CALCIUM 500 +D) 500-400 MG-UNIT TABS Take 2 tablets by mouth daily.     colchicine 0.6 MG tablet Take 0.6 mg by mouth 2 (two) times daily.     dexamethasone (DECADRON) 4 MG tablet Take one pill AM & PM x 3 days; start the day prior to chemo. 60 tablet 0   diphenoxylate-atropine (LOMOTIL) 2.5-0.025 MG tablet TAKE (2) TABLETS FOUR TIMES DAILY. 240 tablet 0   famotidine (PEPCID) 20 MG tablet Take 1 tablet (20 mg total) by mouth 2 (two) times daily for 5 days. (Patient taking differently: Take 20 mg by mouth 2 (two) times daily as needed. ) 10 tablet 0   folic acid (FOLVITE) 1 MG tablet Take 1 tablet (1 mg total) by mouth daily. 30 tablet 6   hydrocortisone (CORTEF) 20 MG tablet Take 1 tablet (20 mg total) by mouth daily. 30 tablet 0   hydrOXYzine (ATARAX/VISTARIL) 10 MG tablet Take 1 tablet (  10 mg total) by mouth 3 (three) times daily as needed. 60 tablet 0   hyoscyamine (LEVBID) 0.375 MG 12 hr tablet Take 1 tablet (0.375 mg total) by mouth 2 (two) times daily. 60 tablet 0   ondansetron (ZOFRAN ODT) 4 MG disintegrating tablet Take 1 tablet (4 mg total) by mouth every 8 (eight) hours as needed for nausea or vomiting. 30 tablet 2   oxyCODONE (OXYCONTIN) 15 mg 12 hr tablet Take 1 tablet (15 mg total) by mouth every 8 (eight) hours. 90 tablet 0   Oxycodone HCl 10 MG TABS Take 1 tablet (10 mg total) by mouth every 6 (six) hours as needed (for breakthrough pain). 120 tablet 0   pantoprazole (PROTONIX) 40 MG tablet Take 1 tablet (40 mg total) by mouth daily. 90 tablet 1   potassium chloride SA (KLOR-CON) 20 MEQ tablet Take 1 tablet (20 mEq total) by mouth 2 (two) times daily. 30 tablet 0   promethazine (PHENERGAN) 25 MG tablet Take 1 tablet (25 mg total) by mouth every 8 (eight) hours as needed for nausea or vomiting. 30  tablet 6   rivaroxaban (XARELTO) 20 MG TABS tablet Take 1 tablet (20 mg total) by mouth daily with supper. (Patient taking differently: Take 20 mg by mouth daily with supper. Takes in the morning) 30 tablet 6   sertraline (ZOLOFT) 25 MG tablet Take 25 mg PO before bedtime daily for 1 week, then take 50 mg (2 tabs) PO before bedtime daily 60 tablet 1   traMADol (ULTRAM) 50 MG tablet Take 50 mg by mouth every 6 (six) hours as needed.     VENTOLIN HFA 108 (90 Base) MCG/ACT inhaler Inhale 1-2 puffs into the lungs every 4 (four) hours as needed for shortness of breath. 1 Inhaler 1   Facility-Administered Medications Ordered in Other Encounters  Medication Dose Route Frequency Provider Last Rate Last Dose   heparin lock flush 100 unit/mL  500 Units Intravenous Once Corcoran, Melissa C, MD       sodium chloride 0.9 % 1,000 mL with potassium chloride 20 mEq infusion   Intravenous Once Honor Loh E, NP       sodium chloride flush (NS) 0.9 % injection 10 mL  10 mL Intravenous Once Denzil Mceachron, Kirt Boys, NP        VITAL SIGNS: BP 115/72    Pulse 85    Temp 97.8 F (36.6 C) (Oral)    Resp 17    SpO2 99%  There were no vitals filed for this visit.  Estimated body mass index is 26.72 kg/m as calculated from the following:   Height as of 08/28/19: 5' 7"  (1.702 m).   Weight as of 09/10/19: 170 lb 9.6 oz (77.4 kg).  LABS: CBC:    Component Value Date/Time   WBC 8.9 09/16/2019 0748   HGB 11.2 (L) 09/16/2019 0748   HCT 33.6 (L) 09/16/2019 0748   PLT 229 09/16/2019 0748   MCV 102.1 (H) 09/16/2019 0748   NEUTROABS 7.3 09/16/2019 0748   LYMPHSABS 0.8 09/16/2019 0748   MONOABS 0.6 09/16/2019 0748   EOSABS 0.0 09/16/2019 0748   BASOSABS 0.0 09/16/2019 0748   Comprehensive Metabolic Panel:    Component Value Date/Time   NA 138 09/16/2019 0748   K 3.3 (L) 09/16/2019 0748   CL 103 09/16/2019 0748   CO2 22 09/16/2019 0748   BUN 13 09/16/2019 0748   CREATININE 0.81 09/16/2019 0748   GLUCOSE 150  (H) 09/16/2019 0748   CALCIUM  8.6 (L) 09/16/2019 0748   AST 112 (H) 09/16/2019 0748   ALT 97 (H) 09/16/2019 0748   ALKPHOS 176 (H) 09/16/2019 0748   BILITOT 0.7 09/16/2019 0748   PROT 7.1 09/16/2019 0748   ALBUMIN 3.4 (L) 09/16/2019 0748    RADIOGRAPHIC STUDIES: Dg Abd Acute W/chest  Result Date: 09/16/2019 CLINICAL DATA:  Shortness of breath and abdominal pain. History of cervical carcinoma EXAM: DG ABDOMEN ACUTE W/ 1V CHEST COMPARISON:  Chest CT August 01, 2019; abdominal radiographs July 23, 2019; CT abdomen and pelvis July 22, 2019 FINDINGS: PA chest: The cavitary nodular lesions seen on chest CT approximately 7 weeks prior are not appreciable by radiography. There is mild chronic blunting of the left costophrenic angle. No edema or consolidation evident by radiography. Heart size and pulmonary vascularity are normal. No adenopathy. Port-A-Cath tip is in the superior vena cava. Supine and upright abdomen: There is no bowel dilatation or air-fluid level to suggest bowel obstruction. No free air. Surgical clips are noted in the gallbladder fossa region. No blastic or lytic bone lesions. There is degenerative change in the lumbar spine with scoliosis. There are apparent phleboliths in the pelvis. IMPRESSION: No bowel obstruction or free air demonstrable. Surgical clips in gallbladder fossa region. No edema or consolidation. The nodular lesions seen on recent CT are not appreciable by radiography. Port-A-Cath tip in superior vena cava. Cardiac silhouette within normal limits. Electronically Signed   By: Lowella Grip III M.D.   On: 09/16/2019 07:53    PERFORMANCE STATUS (ECOG) : 2 - Symptomatic, <50% confined to bed  Review of Systems Unless otherwise noted, a complete review of systems is negative.  Physical Exam General: NAD, frail appearing, thin Pulmonary: unlabored Extremities: no edema, no joint deformities Skin: no rashes Neurological: Weakness but otherwise  nonfocal  IMPRESSION: Was called to the ER by Dr. Jimmye Norman. Patient presented with nausea and abd pain. Symptoms are recurrent and likely attributed to her cancer/treatment. Workup is unrevealing. Patient says she feels better after receiving pain meds in ER.   Discussed pain regimen. She does find relief with use of oxycodone/OxyContin. However, she feels meds are not strong enough to fully control symptoms. Discussed options of rotating to a different LAO vs increasing dose of OxyContin. Patient seems to be tolerating medications without reported adverse effects.   Will increase dose of OxyContin to 96m Q8H and increase dose of oxycodone IR to 10-237mQ6H as needed for BTP.   I do feel that anxiety and depression play a significant role in her symptoms. Patient's parents went out of town this week and patient says symptoms are worse when she is alone. She was recently started on Zoloft and is now taking 5034maily. She is also taking amitriptyline at bedtime. WIll consider increasing dose of Zoloft as tolerated.   Patient has previously discussed stopping treatment and pursuing home hospice. She again mentioned that to me today. She would like to bring her mother to the clinic to speak with Dr. BraRogue Bussingd explore options. Patient says she would like to have more good days where she can maximize her quality of life before she dies.   Patient wanted to make sure that her DNR was on file. [place] a new D[place] order for her to have at home.    Case discussed with Dr. WilJimmye Normand Dr. BraRogue BussingPLAN: -Continue current scope of treatment -Increase OxyContin to 20 mg every 8 hours -Increase oxycodone  IR to 10-20 mg every 6 hours as needed for breakthrough pain -DNR/DNI -Recommend checking serum cortisol level prior to discharge from the ER -Follow-up visit with Dr. Rogue Bussing on 12/3   Time Total: 60 minutes  Visit consisted of counseling and education  dealing with the complex and emotionally intense issues of symptom management and palliative care in the setting of serious and potentially life-threatening illness.Greater than 50%  of this time was spent counseling and coordinating care related to the above assessment and plan.  Signed by: Altha Harm, PhD, NP-C

## 2019-09-16 NOTE — ED Provider Notes (Signed)
[facility] Emergency Department Provider Note       Time seen: ----------------------------------------- 7:29 AM on 09/16/2019 -----------------------------------------  Level V caveat: History/ROS limited by altered mental status I have reviewed the triage vital signs and the nursing notes.  HISTORY   Chief Complaint Abdominal Pain    HPI Joanna Hall is a 53 y.o. female with a history of cervical cancer, chronic diarrhea, GERD, chronic hepatitis, PE, colitis, small bowel obstruction who presents to the ED for abdominal pain that began this morning.  Patient reports a history of same, she presents hysterically crying and unable to sit still with dry heaving.  She then had biliary emesis in the room.  Pain is 10 out of 10, she cannot give further review of systems or report due to severe pain and vomiting.  Past Medical History:  Diagnosis Date  . Abdominal pain 06/10/2018  . Abnormal cervical Papanicolaou smear 09/18/2017  . Anxiety   . Aortic atherosclerosis ([place])   . Arthritis    neck and knees  . 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 Blood transfusion without reported diagnosis   . Cervical cancer ([place]) 09/2016   mets lung  . Chronic anal fissure   . Chronic diarrhea   . Dyspnea   . Erosive gastropathy 09/18/2017  . Factor V Leiden mutation ([place])   . Fecal incontinence   . Genital warts   . GERD (gastroesophageal reflux disease)   . GI bleed 10/08/2018  . Heart murmur   . Hematochezia   . Hemorrhoids   . 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 Hepatitis, chronic ([place]) 05/05/2017  . History of cancer chemotherapy    completed 06/2017  . History of Clostridium difficile infection    while undergoing chemo.  Negative test 10/2017  . Ileocolic anastomotic leak   . 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 Intestinal infection due to Clostridium difficile 09/18/2017  . Macrocytic  anemia with vitamin B12 deficiency   . Multiple gastric ulcers   . Nausea vomiting and diarrhea   . Pancolitis ([place]) 07/27/2018  . Perianal condylomata   . Pneumonia    History of  . Pulmonary nodules   . Rectal bleeding   . Small bowel obstruction ([place]) 08/2017  . Stiff neck    limited right turn  . Vitamin D deficiency     Patient Active Problem List   Diagnosis Date Noted  . Thrombocytopenia ([place]) 08/17/2019  . Intractable lower abdominal pain   . Abdominal pain 07/23/2019  . Constipation 07/22/2019  . Malnutrition of moderate degree 06/22/2019  . Small bowel obstruction due to adhesions ([place]) 06/21/2019  . Acute gastroenteritis 06/14/2019  . Intractable nausea and vomiting 06/07/2019  . Chest pain in adult 05/19/2019  . Intractable pain 05/19/2019  . Cancer associated pain   . Proctitis, radiation   . Palliative care encounter   . Encounter for monitoring opioid maintenance therapy 11/19/2018  . Adrenal insufficiency ([place]) 09/03/2018  . Chronic diarrhea 06/25/2018  . Chronic anticoagulation 06/25/2018  . Vomiting   . Encounter for antineoplastic chemotherapy 06/04/2018  . 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 Lung metastasis ([place]) 05/15/2018  . Closed fracture of distal end of radius 05/04/2018  . History of total knee arthroplasty 04/24/2018  . Osteoarthritis of left knee 02/28/2018  . Condyloma acuminatum of anus s/p ablation 02/22/2018 02/22/2018  .  Condyloma acuminatum of vagina s/p ablation 02/22/2018 02/22/2018  . Positive ANA (antinuclear antibody) 02/04/2018  . Aortic atherosclerosis ([place]) 01/15/2018  . Elevated MCV 01/15/2018  . Anemia 01/15/2018  . Genital warts 01/15/2018  . Vitamin D deficiency 01/15/2018  . Pernicious anemia   . Impingement syndrome of shoulder region 12/19/2017  . Multiple joint pain 12/19/2017  . Osteoarthritis of right knee 12/19/2017  . B12 deficiency 12/11/2017  . Lung nodules 12/11/2017  . History of Clostridium difficile  infection 10/23/2017  . Factor V Leiden ([place]) 10/19/2017  . Goals of care, counseling/discussion 10/19/2017  . Cervical cancer, FIGO stage IB1 ([place]) 09/18/2017  . Chronic hepatitis C without hepatic coma ([place]) 09/18/2017  . Cytopenia 09/18/2017  . Diarrhea 09/18/2017  . Lower abdominal pain 09/18/2017  . 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 Malignant neoplasm of overlapping sites of cervix ([place]) 09/18/2017  . Renal insufficiency 09/18/2017  . Wound infection after surgery 09/12/2017  . Hypokalemia   . Hypomagnesemia   . Cervical arthritis 07/18/2017  . Dysuria 06/20/2017  . Metastatic cancer ([place]) 05/12/2017  . Essential hypertension 03/15/2017  . Anemia in other chronic diseases classified elsewhere 03/01/2017  . Chemotherapy-induced neutropenia ([place]) 01/29/2017  . 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 CHOLECYSTECTOMY    . COLON SURGERY  08/2017   resection  . COLONOSCOPY WITH PROPOFOL N/A 12/20/2017   Procedure: COLONOSCOPY WITH PROPOFOL;  Surgeon: [name], MD;  [facility] ENDOSCOPY;  Service: Gastroenterology;  Laterality: N/A;  . COLONOSCOPY WITH PROPOFOL N/A 07/30/2018   Procedure: COLONOSCOPY WITH PROPOFOL;  Surgeon: [name], MD;  Location: [facility] ENDOSCOPY;  Service: Gastroenterology;  Laterality: N/A;  . COLONOSCOPY WITH PROPOFOL N/A 10/10/2018   Procedure: COLONOSCOPY WITH PROPOFOL;  Surgeon: Lucilla Lame, MD;  Location[facility] ENDOSCOPY;  Service: Endoscopy;  Laterality: N/A;  . DIAGNOSTIC LAPAROSCOPY    . ESOPHAGOGASTRODUODENOSCOPY (EGD) WITH PROPOFOL N/A 12/20/2017   Procedure: ESOPHAGOGASTRODUODENOSCOPY (EGD) WITH PROPOFOL;  Surgeon: [name], MD;  Location: [place];  Service: Gastroenterology;  Laterality: N/A;  . ESOPHAGOGASTRODUODENOSCOPY (EGD) WITH PROPOFOL  07/30/2018   Procedure: ESOPHAGOGASTRODUODENOSCOPY (EGD) WITH PROPOFOL;  Surgeon: [name],  MD;  Location: [facility] ENDOSCOPY;  Service: Gastroenterology;;  . Otho Darner SIGMOIDOSCOPY N/A 11/21/2018   Procedure: FLEXIBLE SIGMOIDOSCOPY;  Surgeon: [name], MD;  Location: [facility] ENDOSCOPY;  Service: Gastroenterology;  Laterality: N/A;  . LAPAROTOMY N/A 08/31/2017   Procedure: EXPLORATORY LAPAROTOMY for SBO, ileocolectomy, removal of piece of uterine wall;  Surgeon: [name], MD;  Location: [facility] ORS;  Service: General;  Laterality: N/A;  . LASER ABLATION CONDOLAMATA N/A 02/22/2018   Procedure: LASER ABLATION/REMOVAL OF TOIZTIWPYKD AROUND ANUS AND VAGINA;  Surgeon: Michael Boston, MD;  Location: [place];  Service: General;  Laterality: N/A;  . OOPHORECTOMY    . PORTA CATH INSERTION N/A 05/13/2018   Procedure: PORTA CATH INSERTION;  Surgeon: Katha Cabal, MD;  Location: [place] [facility];  Service: Cardiovascular;  Laterality: N/A;  . SMALL INTESTINE SURGERY    . TANDEM RING INSERTION     x3  . THORACOTOMY    . TOTAL KNEE ARTHROPLASTY Left 04/24/2018   Procedure: TOTAL KNEE ARTHROPLASTY;  Surgeon: Lovell Sheehan, MD;  Location: [facility] ORS;  Service: Orthopedics;  Laterality: Left;    Allergies Ketamine  Social History Social History   Tobacco Use  . Smoking status: Former Smoker    Packs/day: 0.25  Years: 10.00    Pack years: 2.50    Types: Cigarettes    Quit date: 10/16/2006    Years since quitting: 12.9  . Smokeless tobacco: Never Used  Substance Use Topics  . Alcohol use: Not Currently    Frequency: Never    Comment: seldom  . Drug use: Yes    Types: Marijuana    Comment: not very often   Review of Systems Unknown, positive for severe diffuse pain, vomiting, shortness of breath  All systems negative/normal/unremarkable except as stated in the HPI  ____________________________________________   PHYSICAL EXAM:  VITAL SIGNS: ED Triage Vitals  Enc Vitals Group     BP      Pulse      Resp      Temp      Temp src      SpO2       Weight      Height      Head Circumference      Peak Flow      Pain Score      Pain Loc      Pain Edu?      Excl. in [place]?    Constitutional: Alert and agitated, moderate distress from pain and vomiting Eyes: Conjunctivae are normal. Normal extraocular movements. ENT      Head: Normocephalic and atraumatic.      Nose: No congestion/rhinnorhea.      Mouth/Throat: Mucous membranes are moist.      Neck: No stridor. Cardiovascular: Normal rate, regular rhythm. No murmurs, rubs, or gallops. Respiratory: Tachypnea with clear breath sounds. No wheezes/rales/rhonchi. Gastrointestinal: Nonfocal tenderness, normal bowel sounds Musculoskeletal: Nontender with normal range of motion in extremities. No lower extremity tenderness nor edema. Neurologic:  No gross focal neurologic deficits are appreciated.  Skin:  Skin is warm, dry and intact. No rash noted. Psychiatric: Agitated, cannot cooperate with examination at this time ____________________________________________  ED COURSE:  As part of my medical decision making, I reviewed the following data within the [place] History obtained from family if available, nursing notes, old chart and ekg, as well as notes from prior ED visits. Patient presented for diffuse pain and vomiting with shortness of breath, we will assess with labs and imaging as indicated at this time.   Procedures  Joanna Hall was evaluated in Emergency Department on 09/16/2019 for the symptoms described in the history of present illness. She was evaluated in the context of the global COVID-19 pandemic, which necessitated consideration that the patient might be at risk for infection with the SARS-CoV-2 virus that causes COVID-19. Institutional protocols and algorithms that pertain to the evaluation of patients at risk for COVID-19 are in a state of rapid change based on information released by regulatory bodies including the CDC and federal and state  organizations. These policies and algorithms were followed during the patient's care in the ED.  ____________________________________________   LABS (pertinent positives/negatives)  Labs Reviewed  CBC WITH DIFFERENTIAL/PLATELET - Abnormal; Notable for the following components:      Result Value   RBC 3.29 (*)    Hemoglobin 11.2 (*)    HCT 33.6 (*)    MCV 102.1 (*)    RDW 16.0 (*)    Abs Immature Granulocytes 0.17 (*)    All other components within normal limits  COMPREHENSIVE METABOLIC PANEL - Abnormal; Notable for the following components:   Potassium 3.3 (*)    Glucose, Bld 150 (*)    Calcium 8.6 (*)  Albumin 3.4 (*)    AST 112 (*)    ALT 97 (*)    Alkaline Phosphatase 176 (*)    All other components within normal limits  LIPASE, BLOOD  TROPONIN I (HIGH SENSITIVITY)    RADIOLOGY Images were viewed by me  Acute abdominal series IMPRESSION:  No bowel obstruction or free air demonstrable. Surgical clips in  gallbladder fossa region.   No edema or consolidation. The nodular lesions seen on recent CT are  not appreciable by radiography. Port-A-Cath tip in superior vena  cava. Cardiac silhouette within normal limits.  ____________________________________________   DIFFERENTIAL DIAGNOSIS   Metastatic cancer, narcotic withdrawal, small bowel obstruction, dehydration, electrolyte abnormality, sepsis, PE  FINAL ASSESSMENT AND PLAN  Abdominal pain, vomiting   Plan: The patient had presented for severe abdominal pain and vomiting. Patient's labs did not reveal any acute process. Patient's imaging was also reassuring.  Unclear etiology for her symptoms at this time, could be related to chemotherapeutic agents, chronic pain, metastatic cancer or other.  Overall she appears to be improved.   Laurence Aly, MD    Note: This note was generated in part or whole with voice recognition software. Voice recognition is usually quite accurate but there are transcription  errors that can and very often do occur. I apologize for any typographical errors that were not detected and corrected.     Earleen Newport, MD 09/16/19 [phone]

## 2019-09-16 NOTE — ED Notes (Signed)
Patient ambulatory to and room restroom at this time with steady gait.

## 2019-09-16 NOTE — ED Provider Notes (Signed)
Patient was seen by palliative care and cleared for outpatient follow-up.   Earleen Newport, MD 09/16/19 1230

## 2019-09-16 NOTE — Progress Notes (Signed)
Will increase dose of OxyContin to 30m 8H (#45). May take oxycodone 10-295mQ6H PRN for BTP. See full note.

## 2019-09-16 NOTE — ED Notes (Signed)
Patient no longer hysterical at this time. Patient resting on stretcher, able to sit still. Even and non labored respirations noted. Lights turned down for patient comfort. Will continue to monitor.

## 2019-09-16 NOTE — ED Triage Notes (Signed)
Patient presents to ED via [facility] from home with c/o abdominal pain that began this morning. History of same. Patient arrives hysterical, crying, unable to sit still. Patient dry heaving on arrival.

## 2019-09-17 ENCOUNTER — Other Ambulatory Visit: Payer: Self-pay

## 2019-09-17 NOTE — Progress Notes (Signed)
Patient pre screened for office appointment, no questions or concerns today. Patient reminded of upcoming appointment time and date. 

## 2019-09-18 ENCOUNTER — Inpatient Hospital Stay (HOSPITAL_BASED_OUTPATIENT_CLINIC_OR_DEPARTMENT_OTHER): Payer: Medicaid Other | Admitting: Internal Medicine

## 2019-09-18 ENCOUNTER — Inpatient Hospital Stay: Payer: Medicaid Other | Attending: Internal Medicine

## 2019-09-18 ENCOUNTER — Other Ambulatory Visit: Payer: Self-pay

## 2019-09-18 ENCOUNTER — Inpatient Hospital Stay: Payer: Medicaid Other

## 2019-09-18 DIAGNOSIS — R918 Other nonspecific abnormal finding of lung field: Secondary | ICD-10-CM | POA: Insufficient documentation

## 2019-09-18 DIAGNOSIS — Z86711 Personal history of pulmonary embolism: Secondary | ICD-10-CM | POA: Insufficient documentation

## 2019-09-18 DIAGNOSIS — Z86718 Personal history of other venous thrombosis and embolism: Secondary | ICD-10-CM | POA: Diagnosis not present

## 2019-09-18 DIAGNOSIS — G629 Polyneuropathy, unspecified: Secondary | ICD-10-CM | POA: Insufficient documentation

## 2019-09-18 DIAGNOSIS — G8929 Other chronic pain: Secondary | ICD-10-CM | POA: Insufficient documentation

## 2019-09-18 DIAGNOSIS — K5900 Constipation, unspecified: Secondary | ICD-10-CM | POA: Diagnosis not present

## 2019-09-18 DIAGNOSIS — Z7189 Other specified counseling: Secondary | ICD-10-CM

## 2019-09-18 DIAGNOSIS — C538 Malignant neoplasm of overlapping sites of cervix uteri: Secondary | ICD-10-CM | POA: Diagnosis not present

## 2019-09-18 DIAGNOSIS — R197 Diarrhea, unspecified: Secondary | ICD-10-CM | POA: Insufficient documentation

## 2019-09-18 DIAGNOSIS — C539 Malignant neoplasm of cervix uteri, unspecified: Secondary | ICD-10-CM | POA: Insufficient documentation

## 2019-09-18 DIAGNOSIS — C7800 Secondary malignant neoplasm of unspecified lung: Secondary | ICD-10-CM | POA: Diagnosis not present

## 2019-09-18 DIAGNOSIS — Z5111 Encounter for antineoplastic chemotherapy: Secondary | ICD-10-CM | POA: Diagnosis not present

## 2019-09-18 DIAGNOSIS — M109 Gout, unspecified: Secondary | ICD-10-CM | POA: Diagnosis not present

## 2019-09-18 DIAGNOSIS — Z79899 Other long term (current) drug therapy: Secondary | ICD-10-CM | POA: Insufficient documentation

## 2019-09-18 DIAGNOSIS — Z7901 Long term (current) use of anticoagulants: Secondary | ICD-10-CM | POA: Insufficient documentation

## 2019-09-18 DIAGNOSIS — R7989 Other specified abnormal findings of blood chemistry: Secondary | ICD-10-CM | POA: Insufficient documentation

## 2019-09-18 LAB — COMPREHENSIVE METABOLIC PANEL
ALT: 67 U/L — ABNORMAL HIGH (ref 0–44)
AST: 53 U/L — ABNORMAL HIGH (ref 15–41)
Albumin: 3.2 g/dL — ABNORMAL LOW (ref 3.5–5.0)
Alkaline Phosphatase: 167 U/L — ABNORMAL HIGH (ref 38–126)
Anion gap: 8 (ref 5–15)
BUN: 13 mg/dL (ref 6–20)
CO2: 23 mmol/L (ref 22–32)
Calcium: 8.9 mg/dL (ref 8.9–10.3)
Chloride: 104 mmol/L (ref 98–111)
Creatinine, Ser: 0.68 mg/dL (ref 0.44–1.00)
GFR calc Af Amer: >60 mL/min (ref >60)
GFR calc non Af Amer: >60 mL/min (ref >60)
Glucose, Bld: 128 mg/dL — ABNORMAL HIGH (ref 70–99)
Potassium: 3.8 mmol/L (ref 3.5–5.1)
Sodium: 135 mmol/L (ref 135–145)
Total Bilirubin: 0.5 mg/dL (ref 0.3–1.2)
Total Protein: 7.2 g/dL (ref 6.5–8.1)

## 2019-09-18 LAB — CBC WITH DIFFERENTIAL/PLATELET
Abs Immature Granulocytes: 0.2 10*3/uL — ABNORMAL HIGH (ref 0.00–0.07)
Basophils Absolute: 0 10*3/uL (ref 0.0–0.1)
Basophils Relative: 0 %
Eosinophils Absolute: 0 10*3/uL (ref 0.0–0.5)
Eosinophils Relative: 0 %
HCT: 33.5 % — ABNORMAL LOW (ref 36.0–46.0)
Hemoglobin: 10.7 g/dL — ABNORMAL LOW (ref 12.0–15.0)
Immature Granulocytes: 1 %
Lymphocytes Relative: 6 %
Lymphs Abs: 1 10*3/uL (ref 0.7–4.0)
MCH: 33.5 pg (ref 26.0–34.0)
MCHC: 31.9 g/dL (ref 30.0–36.0)
MCV: 105 fL — ABNORMAL HIGH (ref 80.0–100.0)
Monocytes Absolute: 0.6 10*3/uL (ref 0.1–1.0)
Monocytes Relative: 4 %
Neutro Abs: 13.8 10*3/uL — ABNORMAL HIGH (ref 1.7–7.7)
Neutrophils Relative %: 89 %
Platelets: 258 10*3/uL (ref 150–400)
RBC: 3.19 MIL/uL — ABNORMAL LOW (ref 3.87–5.11)
RDW: 15.7 % — ABNORMAL HIGH (ref 11.5–15.5)
WBC: 15.5 10*3/uL — ABNORMAL HIGH (ref 4.0–10.5)
nRBC: 0 % (ref 0.0–0.2)

## 2019-09-18 LAB — MAGNESIUM: Magnesium: 1.4 mg/dL — ABNORMAL LOW (ref 1.7–2.4)

## 2019-09-18 MED ORDER — CYANOCOBALAMIN 1000 MCG/ML IJ SOLN
1000.0000 ug | Freq: Once | INTRAMUSCULAR | Status: AC
  Administered 2019-09-18: 1000 ug via INTRAMUSCULAR
  Filled 2019-09-18: qty 1

## 2019-09-18 MED ORDER — MAGNESIUM SULFATE 4 GM/100ML IV SOLN
4.0000 g | Freq: Once | INTRAVENOUS | Status: AC
  Administered 2019-09-18: 4 g via INTRAVENOUS
  Filled 2019-09-18: qty 100

## 2019-09-18 MED ORDER — HEPARIN SOD (PORK) LOCK FLUSH 100 UNIT/ML IV SOLN
500.0000 [IU] | Freq: Once | INTRAVENOUS | Status: AC | PRN
  Administered 2019-09-18: 500 [IU]

## 2019-09-18 MED ORDER — PROCHLORPERAZINE MALEATE 10 MG PO TABS
10.0000 mg | ORAL_TABLET | Freq: Once | ORAL | Status: AC
  Administered 2019-09-18: 10 mg via ORAL
  Filled 2019-09-18: qty 1

## 2019-09-18 MED ORDER — SODIUM CHLORIDE 0.9 % IV SOLN
526.0000 mg/m2 | Freq: Once | INTRAVENOUS | Status: AC
  Administered 2019-09-18: 1000 mg via INTRAVENOUS
  Filled 2019-09-18: qty 40

## 2019-09-18 MED ORDER — SODIUM CHLORIDE 0.9 % IV SOLN
Freq: Once | INTRAVENOUS | Status: AC
  Administered 2019-09-18: 10:00:00 via INTRAVENOUS
  Filled 2019-09-18: qty 250

## 2019-09-18 NOTE — Progress Notes (Signed)
[facility] NOTE  Patient Care Team: Delsa Grana, PA-C as PCP - General (Family Medicine) Mellody Drown, MD as Consulting Physician (Obstetrics and Gynecology) [name], MD as Consulting Physician (Gastroenterology) Michael Boston, MD as Consulting Physician (General Surgery) Lovell Sheehan, MD as Consulting Physician (Orthopedic Surgery) Cammie Sickle, MD as Consulting Physician (Oncology) Earnestine Leys, MD as Consulting Physician (Orthopedic Surgery)  CHIEF COMPLAINTS/PURPOSE OF CONSULTATION: Cervical cancer  #  Oncology History Overview Note  # dec 2017- CERVICAL ADENO CA [[place]]; Stage IB [Dr. Christene Slates at [facility] in [place], [place];No surgery- Chemo-RT;   # 2018-sep - lung nodules [in [place]]  # AUG 20th, 2019-#1 Carbo-Taxol s/p 6 cycles- [No avastin sec to blood clots]: Feb 2020-bilateral lung nodules with 1 cm in size. March 2020- finished carbo-taxol #7; poor tolerance to [place].  # April 15 th 2020- Start Taxol weekly x 3; one week OFF;  # July 2020-CT- bil cavitary lesions- slightly bigger by few mm/overall STABLE; CT a/p-NED. 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  She received treatment from 11/2016-05/2017.  01/2017 cisplatin x 2 and carboplatin x 1 (01/29/2017) due to ARF and XRT.  XRT was followed by T & O on 02/01/2017 and T & N 02/10/2017 and 02/20/2017.  Course was  complicated by 80 pound weight loss, nausea, vomiting, electrolyte wasting (potassium and magnesium).  She describes that.  Is been sick constantly requiring at least 20 hospitalizations.  Follow-up CT chest and PET on 06/2017. Per patient, 'radiation worked' and no disease in the abdomen.  At that time she was noted to have lung nodules that were growing and follow-up imaging was scheduled for 10/2017.  She was admitted to hospital in [place] for small bowel obstruction which was managed conservatively and she was home for a week prior to traveling to [place] for Thanksgiving holiday where she has family.  She presented to ER in [place] on 08/2017 with nausea, vomiting, and lower abdominal pain.  Symptoms did not respond to conservative treatment.   CT on 08/26/2017 revealed small bowel obstruction with transition in the pelvis just superior to the uterus rather was a long segment of distal ileum with fatty wall thickening compatible with chronic inflammation and/or radiation enteritis. Imaging showed numerous pulmonary nodules consistent with metastatic disease. She underwent laparotomy and right ileocolectomy on 08/31/2017 at [facility].  Surgical findings revealed a thickened, matted, and scarred piece of distal small bowel close to the ileocecal valve.  She was discharged on 09/05/2017.  Pain markedly increased in intensity and imaging was performed on 09/11/2017 which revealed: Debris within the anterior abdominal wall incision concerning for infection versus packing material, s/p post ileo-colectomy with expected postoperative changes, mild colonic ileus, numerous pulmonary nodules highly concerning for metastatic disease, punctate nonobstructing nephrolithiasis.  Staples were removed and one was packed.  She was started on doxycycline.  Abdominal and pelvic CT without contrast on 09/11/2017 revealed debris within anterior abdominal wall incision concerning for  infection, versus packing  material.   She is s/p ileocolectomy with expected postoperative changes and mild colonic ileus.  There were numerous pulmonary nodules highly concerning for metastatic disease and punctate nonobstructing nephrolithiasis. She was readmitted on 09/12/2017.  She describes the onset of lower abdominal pain on 09/09/2017.  Pain markedly increased in intensity on 09/11/2017.   Staples were removed and the wound packed.  She was started on doxycycline.  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   PET scan was ordered to evaluate enlarging lung nodules with concern for recurrent cervical cancer but scan was delayed due to insurance and need to be performed in [place].  Presented to ER on 12/03/2017 for abdominal pain and emesis.  Imaging concerning for worsening possible uterine infarct and she was admitted to hospital.  Pelvic MRI was unremarkable.  Remote scarring type changes of uterus thought to be possibly related to radiation.  She was discharged on 12/08/2017.  Underwent endoscopy and colonoscopy on 12/20/2017.    On 02/22/2018 she underwent laser ablation of condylomata around the anus and vagina under anesthesia with Dr. Johney Maine.   04/15/2018:  Chest, abdomen, and pelvis CT revealed innumerable (> 100) cavitary nodules scattered in the lungs, moderately enlarging compared to the 11/08/2017 PET-CT, suspicious for metastatic disease.  One index node in the RLL measures 1.0 x 1.1 cm (previously 0.6 x 0.6 cm).  There were no new nodules.  There was an ill-defined wall thickening in the rectosigmoid with surrounding stranding  along fascia planes, probably sequela from prior radiation therapy.  There was multilevel lumbar impingement due to spondylosis and degenerative disc disease.  There was heterogeneous enhancement in the uterus (some possibly from prior radiation therapy).   04/23/2018:  PET scan revealed numerous scattered solid and cavitary nodules in the lungs stable increased in size compared to the prior PET-CT from 11/08/2017. Largest nodule was 1.1 cm in the LUL (SUV 1.9).  These demonstrated low-grade metabolic activity up to a maximum SUV of about 2.3, increased from 11/08/2017.    Case was discussed at tumor board on 04/25/2018. Consensus to pursue CT-guided biopsy (05/06/18) which revealed: Metastatic adenocarcinoma, morphologically consistent with cervical adenocarcinoma.  She has history of chronic hepatitis C which is managed by GI.  Hepatitis C genotype is 2a/2c.  She receives B12 injections for history of B12 deficiency.  On 04/24/2018 she underwent left total knee replacement with Dr. [name]. --------------------------------------------------------   DIAGNOSIS: Cervical adenocarcinoma  STAGE: IV        ;GOALS: Palliative  CURRENT/MOST RECENT THERAPY : Alimta    Malignant neoplasm of overlapping sites of cervix ([place])  01/29/2019 - 04/23/2019 Chemotherapy   The patient had PACLitaxel (TAXOL) 156 mg in sodium chloride 0.9 % 250 mL chemo infusion (</= 47m/m2), 80 mg/m2 = 156 mg, Intravenous,  Once, 3 of 4 cycles Dose modification: 65 mg/m2 (original dose 80 mg/m2, Cycle 1, Reason: Provider Judgment) Administration: 156 mg (01/29/2019), 156 mg (02/05/2019), 126 mg (02/12/2019), 126 mg (02/26/2019), 126 mg (03/13/2019), 126 mg (03/27/2019), 126 mg (03/20/2019), 126 mg (04/03/2019), 126 mg (04/10/2019)  for chemotherapy treatment.    05/08/2019 - 07/30/2019 Chemotherapy   The  patient had pembrolizumab (KEYTRUDA) 200 mg in sodium chloride 0.9 % 50 mL chemo infusion, 200 mg, Intravenous, Once, 4 of 6  cycles Administration: 200 mg (05/08/2019), 200 mg (05/29/2019), 200 mg (06/19/2019), 200 mg (07/10/2019)  for chemotherapy treatment.    08/07/2019 -  Chemotherapy   The patient had pegfilgrastim-cbqv [facility]) injection 6 mg, 6 mg, Subcutaneous, Once, 2 of 5 cycles Administration: 6 mg (08/29/2019) PEMEtrexed (ALIMTA) 1,000 mg in sodium chloride 0.9 % 100 mL chemo infusion, 950 mg, Intravenous,  Once, 3 of 6 cycles Administration: 1,000 mg (08/07/2019), 1,000 mg (09/18/2019), 1,000 mg (08/28/2019)  for chemotherapy treatment.       HISTORY OF PRESENTING ILLNESS:  Joanna Hall 53 y.o.  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  Patient was recently admitted to hospital for worsening abdominal pain nausea vomiting.  Abdominal x-ray negative for any-obstruction.  Patient was also evaluated by [facility] endocrinology; for the history of adrenal sufficiency.  Patient is recommended to continue hydrocortisone 20 in the morning and 10 as needed in the evening.  Currently denies nausea vomiting but appetite is fair.  Positive for constipation at this time.  Continues to have chronic joint pains back pain.  Awaiting orthopedic evaluation later in the day.  Review of Systems  Constitutional: Positive for malaise/fatigue. Negative for chills, diaphoresis, fever and weight loss.  HENT: Negative for nosebleeds and sore throat.   Eyes: Negative for double vision.  Respiratory: Negative for cough, hemoptysis, sputum production, shortness of breath and wheezing.   Cardiovascular: Negative for chest pain, palpitations, orthopnea and leg swelling.  Gastrointestinal: Positive for abdominal pain and constipation. Negative for blood in stool, heartburn, melena, nausea and vomiting.  Genitourinary: Negative for dysuria, frequency and urgency.  Musculoskeletal: Positive for back pain and joint  pain.  Skin: Negative.  Negative for itching and rash.  Neurological: Positive for tingling. Negative for dizziness, focal weakness, weakness and headaches.  Endo/Heme/Allergies: Does not bruise/bleed easily.  Psychiatric/Behavioral: Negative for depression. The patient is not nervous/anxious and does not have insomnia.      MEDICAL HISTORY:  Past Medical History:  Diagnosis Date  . Abdominal pain 06/10/2018  . Abnormal cervical Papanicolaou smear 09/18/2017  . Anxiety   . Aortic atherosclerosis ([place])   . Arthritis    neck and knees  . 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 Blood transfusion without reported diagnosis   . Cervical cancer ([place]) 09/2016   mets lung  . Chronic anal fissure   . Chronic diarrhea   . Dyspnea   . Erosive gastropathy 09/18/2017  . Factor V Leiden mutation ([place])   . Fecal incontinence   . Genital warts   . GERD (gastroesophageal reflux disease)   . GI bleed 10/08/2018  . Heart murmur   . Hematochezia   . Hemorrhoids   . 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 Hepatitis, chronic ([place]) 05/05/2017  . History of cancer chemotherapy    completed 06/2017  . History of Clostridium difficile infection    while undergoing chemo.  Negative test 10/2017  . Ileocolic anastomotic leak   . 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 Intestinal infection due to Clostridium difficile 09/18/2017  . Macrocytic anemia with vitamin B12 deficiency   . Multiple gastric ulcers   .  Nausea vomiting and diarrhea   . Pancolitis ([place]) 07/27/2018  . Perianal condylomata   . Pneumonia    History of  . Pulmonary nodules   . Rectal bleeding   . Small bowel obstruction ([place]) 08/2017  . Stiff neck    limited right turn  . Vitamin D deficiency     SURGICAL HISTORY: Past Surgical History:  Procedure Laterality Date  . CHOLECYSTECTOMY    . COLON SURGERY  08/2017   resection  . COLONOSCOPY WITH PROPOFOL N/A 12/20/2017    Procedure: COLONOSCOPY WITH PROPOFOL;  Surgeon: [name], MD;  Location: [facility] ENDOSCOPY;  Service: Gastroenterology;  Laterality: N/A;  . COLONOSCOPY WITH PROPOFOL N/A 07/30/2018   Procedure: COLONOSCOPY WITH PROPOFOL;  Surgeon: [name], MD;  Location: [facility] ENDOSCOPY;  Service: Gastroenterology;  Laterality: N/A;  . COLONOSCOPY WITH PROPOFOL N/A 10/10/2018   Procedure: COLONOSCOPY WITH PROPOFOL;  Surgeon: Lucilla Lame, MD;  Location: [facility] ENDOSCOPY;  Service: Endoscopy;  Laterality: N/A;  . DIAGNOSTIC LAPAROSCOPY    . ESOPHAGOGASTRODUODENOSCOPY (EGD) WITH PROPOFOL N/A 12/20/2017   Procedure: ESOPHAGOGASTRODUODENOSCOPY (EGD) WITH PROPOFOL;  Surgeon: [name], MD;  Location: [place];  Service: Gastroenterology;  Laterality: N/A;  . ESOPHAGOGASTRODUODENOSCOPY (EGD) WITH PROPOFOL  07/30/2018   Procedure: ESOPHAGOGASTRODUODENOSCOPY (EGD) WITH PROPOFOL;  Surgeon: [name], MD;  Location: [facility] ENDOSCOPY;  Service: Gastroenterology;;  . Otho Darner SIGMOIDOSCOPY N/A 11/21/2018   Procedure: FLEXIBLE SIGMOIDOSCOPY;  Surgeon: [name], MD;  Location: [facility] ENDOSCOPY;  Service: Gastroenterology;  Laterality: N/A;  . LAPAROTOMY N/A 08/31/2017   Procedure: EXPLORATORY LAPAROTOMY for SBO, ileocolectomy, removal of piece of uterine wall;  Surgeon: [name], MD;  Location: [facility] ORS;  Service: General;  Laterality: N/A;  . LASER ABLATION CONDOLAMATA N/A 02/22/2018   Procedure: LASER ABLATION/REMOVAL OF CXKGYJEHUDJ AROUND ANUS AND VAGINA;  Surgeon: Michael Boston, MD;  Location: [place];  Service: General;  Laterality: N/A;  . OOPHORECTOMY    . PORTA CATH INSERTION N/A 05/13/2018   Procedure: PORTA CATH INSERTION;  Surgeon: Katha Cabal, MD;  Location: [facility] CV LAB;  Service: Cardiovascular;  Laterality: N/A;  . SMALL INTESTINE SURGERY    . TANDEM RING INSERTION     x3  . THORACOTOMY    . TOTAL KNEE ARTHROPLASTY Left  04/24/2018   Procedure: TOTAL KNEE ARTHROPLASTY;  Surgeon: Lovell Sheehan, MD;  Location: [facility] ORS;  Service: Orthopedics;  Laterality: Left;    SOCIAL HISTORY: Social History   Socioeconomic History  . Marital status: Divorced    Spouse name: Not on file  . Number of children: Not on file  . Years of education: Not on file  . Highest education level: Not on file  Occupational History  . Not on file  Social Needs  . Financial resource strain: Not hard at all  . Food insecurity    Worry: Never true    Inability: Never true  . Transportation needs    Medical: No    Non-medical: No  Tobacco Use  . Smoking status: Former Smoker    Packs/day: 0.25    Years: 10.00    Pack years: 2.50    Types: Cigarettes    Quit date: 10/16/2006    Years since quitting: 12.9  . Smokeless tobacco: Never Used  Substance and Sexual Activity  . Alcohol use: Not Currently    Frequency: Never    Comment: seldom  . Drug use: Yes    Types: Marijuana    Comment:  not very often  . Sexual activity: Not Currently    Birth control/protection: Post-menopausal    Comment: Not Asked  Lifestyle  . Physical activity    Days per week: Patient refused    Minutes per session: Patient refused  . Stress: Only a little  Relationships  . Social Herbalist on phone: Patient refused    Gets together: Patient refused    Attends religious service: Patient refused    Active member of club or organization: Patient refused    Attends meetings of clubs or organizations: Patient refused    Relationship status: Patient refused  . Intimate partner violence    Fear of current or ex partner: No    Emotionally abused: No    Physically abused: No    Forced sexual activity: No  Other Topics Concern  . Not on file  Social History Narrative  . Not on file    FAMILY HISTORY: Family History  Problem Relation Age of Onset  . Hypertension Father   . Diabetes Father   . Alcohol abuse Daughter   .  Hypertension Maternal Grandmother   . Diabetes Maternal Grandmother   . Diabetes Paternal Grandmother   . Hypertension Paternal Grandmother     ALLERGIES:  is allergic to ketamine.  MEDICATIONS:  Current Outpatient Medications  Medication Sig Dispense Refill  . Calcium Carb-Cholecalciferol (CALCIUM 500 +D) 500-400 MG-UNIT TABS Take 2 tablets by mouth daily.    . colchicine 0.6 MG tablet Take 0.6 mg by mouth 2 (two) times daily.    Marland Kitchen dexamethasone (DECADRON) 4 MG tablet Take one pill AM & PM x 3 days; start the day prior to chemo. 60 tablet 0  . diphenoxylate-atropine (LOMOTIL) 2.5-0.025 MG tablet TAKE (2) TABLETS FOUR TIMES DAILY. 240 tablet 0  . famotidine (PEPCID) 20 MG tablet Take 1 tablet (20 mg total) by mouth 2 (two) times daily for 5 days. (Patient taking differently: Take 20 mg by mouth 2 (two) times daily as needed. ) 10 tablet 0  . folic acid (FOLVITE) 1 MG tablet Take 1 tablet (1 mg total) by mouth daily. 30 tablet 6  . hydrocortisone (CORTEF) 20 MG tablet Take 1 tablet (20 mg total) by mouth daily. 30 tablet 0  . hydrOXYzine (ATARAX/VISTARIL) 10 MG tablet Take 1 tablet (10 mg total) by mouth 3 (three) times daily as needed. 60 tablet 0  . hyoscyamine (LEVBID) 0.375 MG 12 hr tablet Take 1 tablet (0.375 mg total) by mouth 2 (two) times daily. 60 tablet 0  . ondansetron (ZOFRAN ODT) 4 MG disintegrating tablet Take 1 tablet (4 mg total) by mouth every 8 (eight) hours as needed for nausea or vomiting. 30 tablet 2  . oxyCODONE (OXYCONTIN) 20 mg 12 hr tablet Take 1 tablet (20 mg total) by mouth every 8 (eight) hours. 45 tablet 0  . Oxycodone HCl 10 MG TABS Take 1-2 tablets (10-20 mg total) by mouth every 6 (six) hours as needed (for breakthrough pain). 120 tablet 0  . pantoprazole (PROTONIX) 40 MG tablet Take 1 tablet (40 mg total) by mouth daily. 90 tablet 1  . potassium chloride SA (KLOR-CON) 20 MEQ tablet Take 1 tablet (20 mEq total) by mouth 2 (two) times daily. 30 tablet 0  .  promethazine (PHENERGAN) 25 MG tablet Take 1 tablet (25 mg total) by mouth every 8 (eight) hours as needed for nausea or vomiting. 30 tablet 6  . rivaroxaban (XARELTO) 20 MG TABS tablet Take 1 tablet (  20 mg total) by mouth daily with supper. (Patient taking differently: Take 20 mg by mouth daily with supper. Takes in the morning) 30 tablet 6  . sertraline (ZOLOFT) 25 MG tablet Take 25 mg PO before bedtime daily for 1 week, then take 50 mg (2 tabs) PO before bedtime daily 60 tablet 1  . traMADol (ULTRAM) 50 MG tablet Take 50 mg by mouth every 6 (six) hours as needed.    . VENTOLIN HFA 108 (90 Base) MCG/ACT inhaler Inhale 1-2 puffs into the lungs every 4 (four) hours as needed for shortness of breath. 1 Inhaler 1  . amitriptyline (ELAVIL) 75 MG tablet Take 1 tablet (75 mg total) by mouth at bedtime. 90 tablet 1   No current facility-administered medications for this visit.    Facility-Administered Medications Ordered in Other Visits  Medication Dose Route Frequency Provider Last Rate Last Dose  . heparin lock flush 100 unit/mL  500 Units Intravenous Once Corcoran, Melissa C, MD      . sodium chloride 0.9 % 1,000 mL with potassium chloride 20 mEq infusion   Intravenous Once Honor Loh E, NP      . sodium chloride flush (NS) 0.9 % injection 10 mL  10 mL Intravenous Once Borders, Kirt Boys, NP          .  PHYSICAL EXAMINATION: ECOG PERFORMANCE STATUS: 1 - Symptomatic but completely ambulatory  Vitals:   09/18/19 0849  BP: 135/84  Pulse: 83  Resp: 16  Temp: (!) 96.9 F (36.1 C)  SpO2: 99%   Filed Weights   09/18/19 0849  Weight: 176 lb 6.4 oz (80 kg)    Physical Exam  Constitutional: She is oriented to person, place, and time and well-developed, well-nourished, and in no distress.  Alone.  Walking herself.   HENT:  Head: Normocephalic and atraumatic.  Mouth/Throat: Oropharynx is clear and moist. No oropharyngeal exudate.  Eyes: Pupils are equal, round, and reactive to light.   Neck: Normal range of motion. Neck supple.  Cardiovascular: Normal rate and regular rhythm.  Pulmonary/Chest: Effort normal and breath sounds normal. No respiratory distress. She has no wheezes.  Abdominal: Soft. Bowel sounds are normal. She exhibits no distension and no mass. There is no rebound and no guarding.  Musculoskeletal: Normal range of motion.        General: No tenderness or edema.  Neurological: She is alert and oriented to person, place, and time.  Skin: Skin is warm.  Psychiatric: Affect normal.   LABORATORY DATA:  I have reviewed the data as listed Lab Results  Component Value Date   WBC 15.5 (H) 09/18/2019   HGB 10.7 (L) 09/18/2019   HCT 33.5 (L) 09/18/2019   MCV 105.0 (H) 09/18/2019   PLT 258 09/18/2019   Recent Labs    10/21/18 0845  09/10/19 1104 09/16/19 0748 09/18/19 0827  NA 134*   < > 136 138 135  K 3.9   < > 3.0* 3.3* 3.8  CL 98   < > 99 103 104  CO2 24   < > 24 22 23   GLUCOSE 111*   < > 94 150* 128*  BUN 17   < > 9 13 13   CREATININE 0.91   < > 0.66 0.81 0.68  CALCIUM 9.6   < > 8.7* 8.6* 8.9  GFRNONAA >60   < > >60 >60 >60  GFRAA >60   < > >60 >60 >60  PROT 8.6*   < > 7.2 7.1 7.2  ALBUMIN 4.4   < > 3.3* 3.4* 3.2*  AST 48*   < > 80* 112* 53*  ALT 67*   < > 58* 97* 67*  ALKPHOS 185*   < > 200* 176* 167*  BILITOT 1.4*   < > 1.2 0.7 0.5  BILIDIR 0.3*  --   --   --   --    < > = values in this interval not displayed.    RADIOGRAPHIC STUDIES: I have personally reviewed the radiological images as listed and agreed with the findings in the report. Dg Abd Acute W/chest  Result Date: 09/16/2019 CLINICAL DATA:  Shortness of breath and abdominal pain. History of cervical carcinoma EXAM: DG ABDOMEN ACUTE W/ 1V CHEST COMPARISON:  Chest CT August 01, 2019; abdominal radiographs July 23, 2019; CT abdomen and pelvis July 22, 2019 FINDINGS: PA chest: The cavitary nodular lesions seen on chest CT approximately 7 weeks prior are not appreciable by  radiography. There is mild chronic blunting of the left costophrenic angle. No edema or consolidation evident by radiography. Heart size and pulmonary vascularity are normal. No adenopathy. Port-A-Cath tip is in the superior vena cava. Supine and upright abdomen: There is no bowel dilatation or air-fluid level to suggest bowel obstruction. No free air. Surgical clips are noted in the gallbladder fossa region. No blastic or lytic bone lesions. There is degenerative change in the lumbar spine with scoliosis. There are apparent phleboliths in the pelvis. IMPRESSION: No bowel obstruction or free air demonstrable. Surgical clips in gallbladder fossa region. No edema or consolidation. The nodular lesions seen on recent CT are not appreciable by radiography. Port-A-Cath tip in superior vena cava. Cardiac silhouette within normal limits. Electronically Signed   By: Lowella Grip III M.D.   On: 09/16/2019 07:53    ASSESSMENT & PLAN:   Malignant neoplasm of overlapping sites of cervix [facility]) #Cervical adenocarcinoma with metastasis to the lung-  Oct 9th A/P-CT- increased size of lung nodules. Currently on Alimta-status post 2 cycles.  #Patient tolerated Alimta with moderate-severe difficulties needing multiple admission to the hospital [see below].   # proceed with Alimta #3; Labs today reviewed;  acceptable for treatment today.  Will get scan prior to next visit.  Discussed with the patient  # Left shoulder effusion/inflammatory arthritis/ ? Right knee fluid aspiration currently on "gout medication"/ colchcine.  Stable  # Adrenal insufficiency-on hydrocortisone 20 mg/day; hydrocortisone in PM.  [facility] evaluation.  #Chronic diarrhea/constipation-stable.  #Peripheral neuropathy-stable.  Continue gabapentin.  # Hypomagnesemia-secondary platinum chemotherapy-today mag 1.6.  Stable monitor for now  #Chronic right lower quadrant abdominal pain/scar tissue versus others-worsened currently on  oxycodone.  Followed by palliative care.  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   #Patient will call for magnesium/fluid infusions as needed.  # Discussed with patient that we will make recommendations regarding further chemotherapy based upon CT scan results in the next few weeks.  If patient has continued progression of disease/given the significant difficulties-with treatment/quality of life issues-hospice would be recommended.  However, if patient has response to therapy-we will discuss options like dose reductions.  Etc.  # DISPOSITION:  # Alimta/IV Mag today; tomorrow udenyca.  # follow up on 12/23-MD-Alimta /cbc/cmp/mag/; Mag 4 gm/2 hour; CT chest scan prior; Dr.B     All questions were answered. The patient knows to call the clinic with any problems, questions or concerns.    Cammie Sickle, MD 09/19/2019 7:36 AM

## 2019-09-18 NOTE — Assessment & Plan Note (Addendum)
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 Currently on Alimta-status post 2 cycles.  #Patient tolerated Alimta with moderate-severe difficulties needing multiple admission to the hospital [see below].   # proceed with Alimta #3; Labs today reviewed;  acceptable for treatment today.  Will get scan prior to next visit.  Discussed with the patient  # Left shoulder effusion/inflammatory arthritis/ ? Right knee fluid aspiration currently on "gout medication"/ colchcine.  Stable  # Adrenal insufficiency-on hydrocortisone 20 mg/day; hydrocortisone in PM.  S/p [facility] endocrinology evaluation.  #Chronic diarrhea/constipation-stable.  #Peripheral neuropathy-stable.  Continue gabapentin.  # Hypomagnesemia-secondary platinum chemotherapy-today mag 1.6.  Stable monitor for now  #Chronic right lower quadrant abdominal pain/scar tissue versus others-worsened currently on oxycodone.  Followed by palliative care.  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   #Patient will call for magnesium/fluid infusions as needed.  # Discussed with patient that we will make recommendations regarding further chemotherapy based upon CT scan results in the next few weeks.  If patient has continued progression of disease/given the significant difficulties-with treatment/quality of life issues-hospice would be recommended.  However, if patient has response to therapy-we will discuss options like dose reductions.  Etc.  # DISPOSITION:  # Alimta/IV Mag today; tomorrow udenyca.  # follow up on 12/23-MD-Alimta /cbc/cmp/mag/; Mag 4 gm/2 hour; CT chest scan prior; Dr.B

## 2019-09-19 ENCOUNTER — Inpatient Hospital Stay: Payer: Medicaid Other

## 2019-09-19 ENCOUNTER — Other Ambulatory Visit: Payer: Self-pay

## 2019-09-19 DIAGNOSIS — Z5111 Encounter for antineoplastic chemotherapy: Secondary | ICD-10-CM | POA: Diagnosis not present

## 2019-09-19 DIAGNOSIS — Z7189 Other specified counseling: Secondary | ICD-10-CM

## 2019-09-19 DIAGNOSIS — C538 Malignant neoplasm of overlapping sites of cervix uteri: Secondary | ICD-10-CM

## 2019-09-19 MED ORDER — PEGFILGRASTIM-CBQV 6 MG/0.6ML ~~LOC~~ SOSY
6.0000 mg | PREFILLED_SYRINGE | Freq: Once | SUBCUTANEOUS | Status: AC
  Administered 2019-09-19: 6 mg via SUBCUTANEOUS
  Filled 2019-09-19: qty 0.6

## 2019-09-22 ENCOUNTER — Other Ambulatory Visit: Payer: Self-pay

## 2019-09-22 ENCOUNTER — Emergency Department
Admission: EM | Admit: 2019-09-22 | Discharge: 2019-09-22 | Disposition: A | Discharge status: Home / Self Care | Payer: Medicaid Other | Attending: Emergency Medicine | Admitting: Emergency Medicine

## 2019-09-22 ENCOUNTER — Emergency Department: Payer: Medicaid Other

## 2019-09-22 DIAGNOSIS — Z79899 Other long term (current) drug therapy: Secondary | ICD-10-CM | POA: Insufficient documentation

## 2019-09-22 DIAGNOSIS — Z85118 Personal history of other malignant neoplasm of bronchus and lung: Secondary | ICD-10-CM | POA: Diagnosis not present

## 2019-09-22 DIAGNOSIS — Z8541 Personal history of malignant neoplasm of cervix uteri: Secondary | ICD-10-CM | POA: Diagnosis not present

## 2019-09-22 DIAGNOSIS — Z87891 Personal history of nicotine dependence: Secondary | ICD-10-CM | POA: Diagnosis not present

## 2019-09-22 DIAGNOSIS — K529 Noninfective gastroenteritis and colitis, unspecified: Secondary | ICD-10-CM | POA: Insufficient documentation

## 2019-09-22 DIAGNOSIS — Z9221 Personal history of antineoplastic chemotherapy: Secondary | ICD-10-CM | POA: Insufficient documentation

## 2019-09-22 DIAGNOSIS — R079 Chest pain, unspecified: Secondary | ICD-10-CM | POA: Diagnosis present

## 2019-09-22 DIAGNOSIS — I10 Essential (primary) hypertension: Secondary | ICD-10-CM | POA: Diagnosis not present

## 2019-09-22 DIAGNOSIS — Z7901 Long term (current) use of anticoagulants: Secondary | ICD-10-CM | POA: Insufficient documentation

## 2019-09-22 DIAGNOSIS — R1010 Upper abdominal pain, unspecified: Secondary | ICD-10-CM | POA: Diagnosis not present

## 2019-09-22 DIAGNOSIS — F1210 Cannabis abuse, uncomplicated: Secondary | ICD-10-CM | POA: Insufficient documentation

## 2019-09-22 DIAGNOSIS — Z96652 Presence of left artificial knee joint: Secondary | ICD-10-CM | POA: Insufficient documentation

## 2019-09-22 DIAGNOSIS — R1110 Vomiting, unspecified: Secondary | ICD-10-CM | POA: Insufficient documentation

## 2019-09-22 LAB — COMPREHENSIVE METABOLIC PANEL
ALT: 191 U/L — ABNORMAL HIGH (ref 0–44)
AST: 119 U/L — ABNORMAL HIGH (ref 15–41)
Albumin: 4.1 g/dL (ref 3.5–5.0)
Alkaline Phosphatase: 213 U/L — ABNORMAL HIGH (ref 38–126)
Anion gap: 15 (ref 5–15)
BUN: 18 mg/dL (ref 6–20)
CO2: 21 mmol/L — ABNORMAL LOW (ref 22–32)
Calcium: 9.7 mg/dL (ref 8.9–10.3)
Chloride: 97 mmol/L — ABNORMAL LOW (ref 98–111)
Creatinine, Ser: 0.59 mg/dL (ref 0.44–1.00)
GFR calc Af Amer: >60 mL/min (ref >60)
GFR calc non Af Amer: >60 mL/min (ref >60)
Glucose, Bld: 156 mg/dL — ABNORMAL HIGH (ref 70–99)
Potassium: 3.7 mmol/L (ref 3.5–5.1)
Sodium: 133 mmol/L — ABNORMAL LOW (ref 135–145)
Total Bilirubin: 1.1 mg/dL (ref 0.3–1.2)
Total Protein: 8.3 g/dL — ABNORMAL HIGH (ref 6.5–8.1)

## 2019-09-22 LAB — URINALYSIS, COMPLETE (UACMP) WITH MICROSCOPIC
Bacteria, UA: NONE SEEN
Bilirubin Urine: NEGATIVE
Glucose, UA: NEGATIVE mg/dL
Hgb urine dipstick: NEGATIVE
Ketones, ur: 20 mg/dL — AB
Nitrite: NEGATIVE
Protein, ur: 30 mg/dL — AB
Specific Gravity, Urine: 1.024 (ref 1.005–1.030)
pH: 6 (ref 5.0–8.0)

## 2019-09-22 LAB — CBC WITH DIFFERENTIAL/PLATELET
Abs Immature Granulocytes: 0.77 10*3/uL — ABNORMAL HIGH (ref 0.00–0.07)
Basophils Absolute: 0 10*3/uL (ref 0.0–0.1)
Basophils Relative: 0 %
Eosinophils Absolute: 0.1 10*3/uL (ref 0.0–0.5)
Eosinophils Relative: 0 %
HCT: 39.7 % (ref 36.0–46.0)
Hemoglobin: 13.6 g/dL (ref 12.0–15.0)
Immature Granulocytes: 5 %
Lymphocytes Relative: 7 %
Lymphs Abs: 1.1 10*3/uL (ref 0.7–4.0)
MCH: 33.4 pg (ref 26.0–34.0)
MCHC: 34.3 g/dL (ref 30.0–36.0)
MCV: 97.5 fL (ref 80.0–100.0)
Monocytes Absolute: 0.1 10*3/uL (ref 0.1–1.0)
Monocytes Relative: 1 %
Neutro Abs: 14 10*3/uL — ABNORMAL HIGH (ref 1.7–7.7)
Neutrophils Relative %: 87 %
Platelets: 215 10*3/uL (ref 150–400)
RBC: 4.07 MIL/uL (ref 3.87–5.11)
RDW: 15.1 % (ref 11.5–15.5)
WBC: 16.3 10*3/uL — ABNORMAL HIGH (ref 4.0–10.5)
nRBC: 0 % (ref 0.0–0.2)

## 2019-09-22 LAB — LACTIC ACID, PLASMA
Lactic Acid, Venous: 2.7 mmol/L (ref 0.5–1.9)
Lactic Acid, Venous: 2.1 mmol/L (ref 0.5–1.9)
Lactic Acid, Venous: 1 mmol/L (ref 0.5–1.9)

## 2019-09-22 LAB — TROPONIN I (HIGH SENSITIVITY)
Troponin I (High Sensitivity): 9 ng/L (ref <18)
Troponin I (High Sensitivity): 15 ng/L (ref <18)

## 2019-09-22 LAB — LIPASE, BLOOD: Lipase: 28 U/L (ref 11–51)

## 2019-09-22 MED ORDER — SODIUM CHLORIDE 0.9 % IV BOLUS
1000.0000 mL | Freq: Once | INTRAVENOUS | Status: AC
  Administered 2019-09-22: 1000 mL via INTRAVENOUS

## 2019-09-22 MED ORDER — HYDROMORPHONE HCL 1 MG/ML IJ SOLN
1.0000 mg | Freq: Once | INTRAMUSCULAR | Status: AC
  Administered 2019-09-22: 1 mg via INTRAMUSCULAR
  Filled 2019-09-22: qty 1

## 2019-09-22 MED ORDER — HYDROMORPHONE HCL 1 MG/ML IJ SOLN
1.0000 mg | Freq: Once | INTRAMUSCULAR | Status: AC
  Administered 2019-09-22: 1 mg via INTRAVENOUS
  Filled 2019-09-22: qty 1

## 2019-09-22 MED ORDER — HYDROMORPHONE HCL 1 MG/ML IJ SOLN
0.5000 mg | Freq: Once | INTRAMUSCULAR | Status: AC
  Administered 2019-09-22: 0.5 mg via INTRAVENOUS
  Filled 2019-09-22: qty 1

## 2019-09-22 MED ORDER — AMOXICILLIN-POT CLAVULANATE 875-125 MG PO TABS: 1.0000 | ORAL_TABLET | Freq: Two times a day (BID) | ORAL | 0 refills | Status: DC

## 2019-09-22 MED ORDER — FAMOTIDINE IN NACL 20-0.9 MG/50ML-% IV SOLN
20.0000 mg | Freq: Once | INTRAVENOUS | Status: AC
  Administered 2019-09-22: 20 mg via INTRAVENOUS
  Filled 2019-09-22: qty 50

## 2019-09-22 MED ORDER — IOHEXOL 300 MG/ML  SOLN
100.0000 mL | Freq: Once | INTRAMUSCULAR | Status: AC | PRN
  Administered 2019-09-22: 100 mL via INTRAVENOUS

## 2019-09-22 MED ORDER — AMOXICILLIN-POT CLAVULANATE 875-125 MG PO TABS
1.0000 | ORAL_TABLET | Freq: Once | ORAL | Status: AC
  Administered 2019-09-22: 1 via ORAL
  Filled 2019-09-22: qty 1

## 2019-09-22 MED ORDER — METOCLOPRAMIDE HCL 5 MG/ML IJ SOLN
10.0000 mg | Freq: Once | INTRAMUSCULAR | Status: AC
  Administered 2019-09-22: 10 mg via INTRAMUSCULAR
  Filled 2019-09-22: qty 2

## 2019-09-22 MED ORDER — ONDANSETRON HCL 4 MG/2ML IJ SOLN
4.0000 mg | Freq: Once | INTRAMUSCULAR | Status: AC
  Administered 2019-09-22: 4 mg via INTRAVENOUS
  Filled 2019-09-22: qty 2

## 2019-09-22 NOTE — ED Provider Notes (Signed)
[facility] Emergency Department Provider Note ____________________________________________   First MD Initiated Contact with Patient 09/22/19 1618     (approximate)  I have reviewed the triage vital signs and the nursing notes.   HISTORY  Chief Complaint Chest Pain    HPI Joanna Hall is a 53 y.o. female with PMH as noted below including metastatic cervical cancer for which she is followed by palliative care and receiving chemotherapy who presents with acute onset of chest and upper abdominal pain last night, persistent course since then, and associated with multiple episodes of vomiting and dry heaving.  Patient states that she has had similar episodes before, but this is the most severe.  She denies cough, fever, or shortness of breath.  Past Medical History:  Diagnosis Date  . Abdominal pain 06/10/2018  . Abnormal cervical Papanicolaou smear 09/18/2017  . Anxiety   . Aortic atherosclerosis ([place])   . Arthritis    neck and knees  . 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 Blood transfusion without reported diagnosis   . Cervical cancer ([place]) 09/2016   mets lung  . Chronic anal fissure   . Chronic diarrhea   . Dyspnea   . Erosive gastropathy 09/18/2017  . Factor V Leiden mutation ([place])   . Fecal incontinence   . Genital warts   . GERD (gastroesophageal reflux disease)   . GI bleed 10/08/2018  . Heart murmur   . Hematochezia   . Hemorrhoids   . 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 Hepatitis, chronic ([place]) 05/05/2017  . History of cancer chemotherapy    completed 06/2017  . History of Clostridium difficile infection    while undergoing chemo.  Negative test 10/2017  . Ileocolic anastomotic leak   . 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 Intestinal infection due to Clostridium difficile 09/18/2017  . Macrocytic anemia with vitamin B12 deficiency   . Multiple gastric ulcers   .  Nausea vomiting and diarrhea   . Pancolitis ([place]) 07/27/2018  . Perianal condylomata   . Pneumonia    History of  . Pulmonary nodules   . Rectal bleeding   . Small bowel obstruction ([place]) 08/2017  . Stiff neck    limited right turn  . Vitamin D deficiency     Patient Active Problem List   Diagnosis Date Noted  . Thrombocytopenia ([place]) 08/17/2019  . Intractable lower abdominal pain   . Abdominal pain 07/23/2019  . Constipation 07/22/2019  . Malnutrition of moderate degree 06/22/2019  . Small bowel obstruction due to adhesions ([place]) 06/21/2019  . Acute gastroenteritis 06/14/2019  . Intractable nausea and vomiting 06/07/2019  . Chest pain in adult 05/19/2019  . Intractable pain 05/19/2019  . Cancer associated pain   . Proctitis, radiation   . Palliative care encounter   . Encounter for monitoring opioid maintenance therapy 11/19/2018  . Adrenal insufficiency ([place]) 09/03/2018  . Chronic diarrhea 06/25/2018  . Chronic anticoagulation 06/25/2018  . Vomiting   . Encounter for antineoplastic chemotherapy 06/04/2018  . 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 Lung metastasis ([place]) 05/15/2018  . Closed fracture of distal end of radius 05/04/2018  . History of total knee arthroplasty 04/24/2018  . Osteoarthritis of left knee 02/28/2018  . Condyloma acuminatum of anus s/p ablation 02/22/2018 02/22/2018  . Condyloma acuminatum of vagina s/p ablation 02/22/2018 02/22/2018  . Positive  ANA (antinuclear antibody) 02/04/2018  . Aortic atherosclerosis ([place]) 01/15/2018  . Elevated MCV 01/15/2018  . Anemia 01/15/2018  . Genital warts 01/15/2018  . Vitamin D deficiency 01/15/2018  . Pernicious anemia   . Impingement syndrome of shoulder region 12/19/2017  . Multiple joint pain 12/19/2017  . Osteoarthritis of right knee 12/19/2017  . B12 deficiency 12/11/2017  . Lung nodules 12/11/2017  . History of Clostridium difficile infection 10/23/2017  . Factor V Leiden ([place]) 10/19/2017  . Goals  of care, counseling/discussion 10/19/2017  . Cervical cancer, FIGO stage IB1 ([place]) 09/18/2017  . Chronic hepatitis C without hepatic coma ([place]) 09/18/2017  . Cytopenia 09/18/2017  . Diarrhea 09/18/2017  . Lower abdominal pain 09/18/2017  . 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 Malignant neoplasm of overlapping sites of cervix ([place]) 09/18/2017  . Renal insufficiency 09/18/2017  . Wound infection after surgery 09/12/2017  . Hypokalemia   . Hypomagnesemia   . Cervical arthritis 07/18/2017  . Dysuria 06/20/2017  . Metastatic cancer ([place]) 05/12/2017  . Essential hypertension 03/15/2017  . Anemia in other chronic diseases classified elsewhere 03/01/2017  . Chemotherapy-induced neutropenia ([place]) 01/29/2017  . 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[place]) 09/25/2016    Past Surgical History:  Procedure Laterality Date  . CHOLECYSTECTOMY    . COLON SURGERY  08/2017   resection  . COLONOSCOPY WITH PROPOFOL N/A 12/20/2017   Procedure: COLONOSCOPY WITH PROPOFOL;  Surgeon: [name], MD;  Location: [facility] ENDOSCOPY;  Service: Gastroenterology;  Laterality: N/A;  . COLONOSCOPY WITH PROPOFOL N/A 07/30/2018   Procedure: COLONOSCOPY WITH PROPOFOL;  Surgeon: [name], MD;  Location: [facility] ENDOSCOPY;  Service: Gastroenterology;  Laterality: N/A;  . COLONOSCOPY WITH PROPOFOL N/A 10/10/2018   Procedure: COLONOSCOPY WITH PROPOFOL;  Surgeon: Lucilla Lame, MD;  Location[facility] ENDOSCOPY;  Service: Endoscopy;  Laterality: N/A;  . DIAGNOSTIC LAPAROSCOPY    . ESOPHAGOGASTRODUODENOSCOPY (EGD) WITH PROPOFOL N/A 12/20/2017   Procedure: ESOPHAGOGASTRODUODENOSCOPY (EGD) WITH PROPOFOL;  Surgeon: [name], MD;  Location: [place];  Service: Gastroenterology;  Laterality: N/A;  . ESOPHAGOGASTRODUODENOSCOPY (EGD) WITH PROPOFOL  07/30/2018   Procedure: ESOPHAGOGASTRODUODENOSCOPY (EGD) WITH PROPOFOL;  Surgeon: [name], MD;  Location: [facility] ENDOSCOPY;  Service: Gastroenterology;;  .  Otho Darner SIGMOIDOSCOPY N/A 11/21/2018   Procedure: FLEXIBLE SIGMOIDOSCOPY;  Surgeon: [name], MD;  Location: [facility] ENDOSCOPY;  Service: Gastroenterology;  Laterality: N/A;  . LAPAROTOMY N/A 08/31/2017   Procedure: EXPLORATORY LAPAROTOMY for SBO, ileocolectomy, removal of piece of uterine wall;  Surgeon: [name], MD;  Location: [facility] ORS;  Service: General;  Laterality: N/A;  . LASER ABLATION CONDOLAMATA N/A 02/22/2018   Procedure: LASER ABLATION/REMOVAL OF [id number] AROUND ANUS AND VAGINA;  Surgeon: Michael Boston, MD;  Location: [place];  Service: General;  Laterality: N/A;  . OOPHORECTOMY    . PORTA CATH INSERTION N/A 05/13/2018   Procedure: PORTA CATH INSERTION;  Surgeon: Katha Cabal, MD;  Location: [place] CV LAB;  Service: Cardiovascular;  Laterality: N/A;  . SMALL INTESTINE SURGERY    . TANDEM RING INSERTION     x3  . THORACOTOMY    . TOTAL KNEE ARTHROPLASTY Left 04/24/2018   Procedure: TOTAL KNEE ARTHROPLASTY;  Surgeon: Lovell Sheehan, MD;  Location: [facility] ORS;  Service: Orthopedics;  Laterality: Left;    Prior to Admission medications   Medication Sig Start Date End Date Taking? Authorizing Provider  amitriptyline (ELAVIL) 75 MG tablet Take 1 tablet (75 mg total) by mouth at bedtime. 05/06/19 09/22/19 Yes Vanga, Rohini  Reece Levy, MD  Calcium Carb-Cholecalciferol (CALCIUM 500 +D) 500-400 MG-UNIT TABS Take 2 tablets by mouth daily.   Yes [provider]  colchicine 0.6 MG tablet Take 0.6 mg by mouth 2 (two) times daily. 06/19/19  Yes [provider]  dexamethasone (DECADRON) 4 MG tablet Take one pill AM & PM x 3 days; start the day prior to chemo. 08/01/19  Yes Cammie Sickle, MD  diphenoxylate-atropine (LOMOTIL) 2.5-0.025 MG tablet TAKE (2) TABLETS FOUR TIMES DAILY. 04/21/19  Yes Vanga, Tally Due, MD  famotidine (PEPCID) 20 MG tablet Take 1 tablet (20 mg total) by mouth 2 (two) times daily for 5 days. Patient taking  differently: Take 20 mg by mouth 2 (two) times daily as needed.  05/23/19 09/22/19 Yes Duffy Bruce, MD  folic acid (FOLVITE) 1 MG tablet Take 1 tablet (1 mg total) by mouth daily. 07/31/19  Yes Cammie Sickle, MD  hydrocortisone (CORTEF) 20 MG tablet Take 1 tablet (20 mg total) by mouth daily. 06/10/19  Yes Gladstone Lighter, MD  hydrOXYzine (ATARAX/VISTARIL) 10 MG tablet Take 1 tablet (10 mg total) by mouth 3 (three) times daily as needed. 08/29/19  Yes Cammie Sickle, MD  hyoscyamine (LEVBID) 0.375 MG 12 hr tablet Take 1 tablet (0.375 mg total) by mouth 2 (two) times daily. 08/05/19  Yes Vanga, Tally Due, MD  ondansetron (ZOFRAN ODT) 4 MG disintegrating tablet Take 1 tablet (4 mg total) by mouth every 8 (eight) hours as needed for nausea or vomiting. 07/08/19  Yes Cammie Sickle, MD  oxyCODONE (OXYCONTIN) 20 mg 12 hr tablet Take 1 tablet (20 mg total) by mouth every 8 (eight) hours. 09/16/19  Yes Borders, Kirt Boys, NP  Oxycodone HCl 10 MG TABS Take 1-2 tablets (10-20 mg total) by mouth every 6 (six) hours as needed (for breakthrough pain). 09/16/19  Yes Borders, Kirt Boys, NP  pantoprazole (PROTONIX) 40 MG tablet Take 1 tablet (40 mg total) by mouth daily. 03/18/19  Yes Cammie Sickle, MD  potassium chloride SA (KLOR-CON) 20 MEQ tablet Take 1 tablet (20 mEq total) by mouth 2 (two) times daily. 09/10/19  Yes Verlon Au, NP  promethazine (PHENERGAN) 25 MG tablet Take 1 tablet (25 mg total) by mouth every 8 (eight) hours as needed for nausea or vomiting. 08/25/19  Yes Cammie Sickle, MD  rivaroxaban (XARELTO) 20 MG TABS tablet Take 1 tablet (20 mg total) by mouth daily with supper. Patient taking differently: Take 20 mg by mouth daily with supper. Takes in the morning 05/05/19  Yes Cammie Sickle, MD  sertraline (ZOLOFT) 25 MG tablet Take 25 mg PO before bedtime daily for 1 week, then take 50 mg (2 tabs) PO before bedtime daily 08/22/19  Yes Delsa Grana, PA-C   traMADol (ULTRAM) 50 MG tablet Take 50 mg by mouth every 6 (six) hours as needed.   Yes [provider]  VENTOLIN HFA 108 (90 Base) MCG/ACT inhaler Inhale 1-2 puffs into the lungs every 4 (four) hours as needed for shortness of breath. 02/19/19  Yes Verlon Au, NP  amoxicillin-clavulanate (AUGMENTIN) 875-125 MG tablet Take 1 tablet by mouth 2 (two) times daily for 10 days. 09/22/19 10/02/19  Arta Silence, MD    Allergies Ketamine  Family History  Problem Relation Age of Onset  . Hypertension Father   . Diabetes Father   . Alcohol abuse Daughter   . Hypertension Maternal Grandmother   . Diabetes Maternal Grandmother   . Diabetes Paternal Grandmother   .  Hypertension Paternal Grandmother     Social History Social History   Tobacco Use  . Smoking status: Former Smoker    Packs/day: 0.25    Years: 10.00    Pack years: 2.50    Types: Cigarettes    Quit date: 10/16/2006    Years since quitting: 12.9  . Smokeless tobacco: Never Used  Substance Use Topics  . Alcohol use: Not Currently    Frequency: Never    Comment: seldom  . Drug use: Yes    Types: Marijuana    Comment: not very often    Review of Systems  Constitutional: No fever. Eyes: No redness. ENT: Positive for throat discomfort. Cardiovascular: Denies chest pain. Respiratory: Denies shortness of breath. Gastrointestinal: Positive for nausea and vomiting. Genitourinary: Negative for dysuria.  Musculoskeletal: Negative for back pain. Skin: Negative for rash. Neurological: Negative for headache.   ____________________________________________   PHYSICAL EXAM:  VITAL SIGNS: ED Triage Vitals  Enc Vitals Group     BP 09/22/19 1617 (!) 180/105     Pulse Rate 09/22/19 1617 (!) 101     Resp 09/22/19 1617 18     Temp 09/22/19 1617 (!) 97.4 F (36.3 C)     Temp Source 09/22/19 1617 Oral     SpO2 09/22/19 1617 100 %     Weight 09/22/19 1618 170 lb (77.1 kg)     Height 09/22/19 1618 5' 8"   (1.727 m)     Head Circumference --      Peak Flow --      Pain Score 09/22/19 1618 10     Pain Loc --      Pain Edu? --      Excl. in [place]? --     Constitutional: Alert and oriented.  Uncomfortable appearing and actively retching Eyes: Conjunctivae are normal..  No scleral icterus. Head: Atraumatic. Nose: No congestion/rhinnorhea. Mouth/Throat: Mucous membranes are dry.   Neck: Normal range of motion.  Cardiovascular: Normal rate, regular rhythm. Good peripheral circulation. Respiratory: Normal respiratory effort.  No retractions.  Gastrointestinal: Soft with mild diffuse discomfort to palpation but no focal tenderness.  Nondistended. Genitourinary: No flank tenderness. Musculoskeletal: Extremities warm and well perfused.  Neurologic:  Normal speech and language. No gross focal neurologic deficits are appreciated.  Skin:  Skin is warm and dry. No rash noted. Psychiatric: Mood and affect are normal. Speech and behavior are normal.  ____________________________________________   LABS (all labs ordered are listed, but only abnormal results are displayed)  Labs Reviewed  COMPREHENSIVE METABOLIC PANEL - Abnormal; Notable for the following components:      Result Value   Sodium 133 (*)    Chloride 97 (*)    CO2 21 (*)    Glucose, Bld 156 (*)    Total Protein 8.3 (*)    AST 119 (*)    ALT 191 (*)    Alkaline Phosphatase 213 (*)    All other components within normal limits  LACTIC ACID, PLASMA - Abnormal; Notable for the following components:   Lactic Acid, Venous 2.7 (*)    All other components within normal limits  LACTIC ACID, PLASMA - Abnormal; Notable for the following components:   Lactic Acid, Venous 2.1 (*)    All other components within normal limits  CBC WITH DIFFERENTIAL/PLATELET - Abnormal; Notable for the following components:   WBC 16.3 (*)    Neutro Abs 14.0 (*)    Abs Immature Granulocytes 0.77 (*)    All other components within normal  limits  URINALYSIS,  COMPLETE (UACMP) WITH MICROSCOPIC - Abnormal; Notable for the following components:   Color, Urine YELLOW (*)    APPearance CLEAR (*)    Ketones, ur 20 (*)    Protein, ur 30 (*)    Leukocytes,Ua SMALL (*)    All other components within normal limits  LIPASE, BLOOD  LACTIC ACID, PLASMA  TROPONIN I (HIGH SENSITIVITY)  TROPONIN I (HIGH SENSITIVITY)   ____________________________________________  EKG  ED ECG REPORT I, Arta Silence, the attending physician, personally viewed and interpreted this ECG.  Date: 09/22/2019 EKG Time: 1608 Rate: 96 Rhythm: normal sinus rhythm QRS Axis: normal Intervals: normal ST/T Wave abnormalities: normal Narrative Interpretation: no evidence of acute ischemia  ____________________________________________  RADIOLOGY  CT abdomen: Mild colitis.  New lesion in the left pubic bone consistent with possible metastatic disease.  ____________________________________________   PROCEDURES  Procedure(s) performed: No  Procedures  Critical Care performed: No ____________________________________________   INITIAL IMPRESSION / ASSESSMENT AND PLAN / ED COURSE  Pertinent labs & imaging results that were available during my care of the patient were reviewed by me and considered in my medical decision making (see chart for details).  53 year old female with PMH as noted above including cervical cancer with most recent chemotherapy last week presents with acute onset of chest and abdominal pain and nausea and vomiting since last night.  I reviewed the past medical records in epic; the patient was seen in the emergency department 6 days ago with a similar presentation and negative work-up at that time.  Her symptoms resolved with medications, and she was discharged home.  She presented in late October for a similar episode 6 days after chemotherapy and was admitted for persistent vomiting and pain.  On exam today, the patient was initially extremely  uncomfortable appearing and unable to give much history.  Her symptoms substantially improved after IM Dilaudid and Reglan, she does have some mild diffuse tenderness but no focal areas of tenderness on abdominal exam, and the remainder of the physical exam is unremarkable except for elevated blood pressure and borderline tachycardia.  Overall I suspect most likely acute on chronic pain related to her chemotherapy and cancer, given the similarity to her prior presentations.  However differential also includes gastritis, hepatobiliary obstruction, UTI or other infection, or less likely colitis, diverticulitis, or SBO.  We will give fluids, symptomatic treatment, obtain labs, and reassess.  ----------------------------------------- 11:04 PM on 09/22/2019 -----------------------------------------  The patient's pain significantly improved after Dilaudid and Reglan.  Initial lab work-up revealed elevated WBC count (which was slightly elevated when she was last here in the ED 4 days ago, but is now a bit higher) as well as elevated LFTs, all of these appear more chronic.  The lactic acid was also elevated.  Given these findings concerning for possible acute infectious process, I obtained a CT of the abdomen.  It showed findings consistent with possible mild colitis which could explain the symptoms.  The lactate improved after fluids but did not fully clear after the first liter.  After a second liter of normal saline, the patient is now feeling much better and the lactic acid is normal.  She still is having some intermittent pain although it has been well controlled and she is tolerating p.o.  Her vital signs have normalized.  Given that the patient is still having some pain, I gave her the option of admission for continued monitoring and antibiotics, however she strongly prefers to go home, which I think  is reasonable given the improvement in her symptoms, and the fact that she is tolerating p.o., and the  clear lactate.  I have prescribed Augmentin for her colitis.  She has pain and nausea medication at home.  I gave her very thorough return precautions and she expressed understanding.  ____________________________________________   FINAL CLINICAL IMPRESSION(S) / ED DIAGNOSES  Final diagnoses:  Colitis      NEW MEDICATIONS STARTED DURING THIS VISIT:  New Prescriptions   AMOXICILLIN-CLAVULANATE (AUGMENTIN) 875-125 MG TABLET    Take 1 tablet by mouth 2 (two) times daily for 10 days.     Note:  This document was prepared using Dragon voice recognition software and may include unintentional dictation errors.    Arta Silence, MD 09/22/19 2317

## 2019-09-22 NOTE — Discharge Instructions (Addendum)
Take the antibiotic as prescribed and finish the full course.  Continue your normal pain and nausea medications as needed.  Return to the ER for new, worsening, or persistent severe abdominal or chest pain, fevers, weakness, or any other new or worsening symptoms that concern you.  Follow-up with your regular doctors.

## 2019-09-22 NOTE — ED Triage Notes (Signed)
Pt arrives via [facility] from home for CP and abdominal pain 10/10 since last night, pt reports having chemo Thursday night. Pt screaming out in pain stating her chest hurts. Attempted IV access x 2 with no success.

## 2019-09-22 NOTE — ED Notes (Signed)
Light green and grey tubes sent to lab

## 2019-09-22 NOTE — ED Notes (Signed)
Report given to [facility]

## 2019-09-22 NOTE — ED Notes (Signed)
Pt ambulated to restroom with steady gait.

## 2019-09-22 NOTE — ED Notes (Signed)
Pt assisted to bathroom. Pt with steady gait. Peri-care performed independently. Pt assisted back to bed at this time.

## 2019-09-22 NOTE — ED Notes (Signed)
Dr. Cherylann Banas at bedside

## 2019-09-22 NOTE — ED Notes (Signed)
Urine sent to lab 

## 2019-09-23 ENCOUNTER — Encounter: Payer: Self-pay | Admitting: Internal Medicine

## 2019-09-23 ENCOUNTER — Telehealth: Payer: Self-pay | Admitting: Internal Medicine

## 2019-09-23 NOTE — Telephone Encounter (Signed)
Spoke to patient regarding results of the CT scan-colitis/improved lung lesions.  Patient abdominal pain improved; diarrhea improved.  On antibiotics.  Plan CT scan/follow-up as planned

## 2019-09-24 ENCOUNTER — Encounter: Payer: Self-pay | Admitting: Oncology

## 2019-09-24 ENCOUNTER — Telehealth: Payer: Self-pay | Admitting: *Deleted

## 2019-09-24 NOTE — Telephone Encounter (Signed)
Patient called requesting refill of her Oxycodone 20 mg. This was refilled 09/16/19 for # 45 tabs to take every 8 hours, thus it should not need to be refilled until 09/30/19. When I questioned her about this, she stated that she must have figured something up wrong and that she still has some, but she just got her dates wrong and she stated "that;s fine, ok"

## 2019-09-25 ENCOUNTER — Inpatient Hospital Stay
Admission: EM | Admit: 2019-09-25 | Discharge: 2019-09-29 | DRG: 394 | Disposition: A | Discharge status: Home / Self Care | Payer: Medicaid Other | Attending: Internal Medicine | Admitting: Internal Medicine

## 2019-09-25 ENCOUNTER — Encounter: Payer: Self-pay | Admitting: Intensive Care

## 2019-09-25 ENCOUNTER — Encounter: Payer: Self-pay | Admitting: Internal Medicine

## 2019-09-25 ENCOUNTER — Other Ambulatory Visit: Payer: Self-pay

## 2019-09-25 ENCOUNTER — Emergency Department: Payer: Medicaid Other

## 2019-09-25 DIAGNOSIS — Z79899 Other long term (current) drug therapy: Secondary | ICD-10-CM

## 2019-09-25 DIAGNOSIS — T451X5A Adverse effect of antineoplastic and immunosuppressive drugs, initial encounter: Secondary | ICD-10-CM | POA: Diagnosis present

## 2019-09-25 DIAGNOSIS — F418 Other specified anxiety disorders: Secondary | ICD-10-CM | POA: Diagnosis present

## 2019-09-25 DIAGNOSIS — B182 Chronic viral hepatitis C: Secondary | ICD-10-CM | POA: Diagnosis present

## 2019-09-25 DIAGNOSIS — F419 Anxiety disorder, unspecified: Secondary | ICD-10-CM | POA: Insufficient documentation

## 2019-09-25 DIAGNOSIS — Z86718 Personal history of other venous thrombosis and embolism: Secondary | ICD-10-CM | POA: Diagnosis not present

## 2019-09-25 DIAGNOSIS — Z888 Allergy status to other drugs, medicaments and biological substances status: Secondary | ICD-10-CM

## 2019-09-25 DIAGNOSIS — C799 Secondary malignant neoplasm of unspecified site: Secondary | ICD-10-CM | POA: Diagnosis not present

## 2019-09-25 DIAGNOSIS — Z87891 Personal history of nicotine dependence: Secondary | ICD-10-CM | POA: Diagnosis not present

## 2019-09-25 DIAGNOSIS — K529 Noninfective gastroenteritis and colitis, unspecified: Secondary | ICD-10-CM | POA: Diagnosis present

## 2019-09-25 DIAGNOSIS — Z86711 Personal history of pulmonary embolism: Secondary | ICD-10-CM

## 2019-09-25 DIAGNOSIS — C7802 Secondary malignant neoplasm of left lung: Secondary | ICD-10-CM | POA: Diagnosis present

## 2019-09-25 DIAGNOSIS — E876 Hypokalemia: Secondary | ICD-10-CM | POA: Diagnosis not present

## 2019-09-25 DIAGNOSIS — M109 Gout, unspecified: Secondary | ICD-10-CM | POA: Diagnosis not present

## 2019-09-25 DIAGNOSIS — Z7901 Long term (current) use of anticoagulants: Secondary | ICD-10-CM | POA: Diagnosis not present

## 2019-09-25 DIAGNOSIS — Y842 Radiological procedure and radiotherapy as the cause of abnormal reaction of the patient, or of later complication, without mention of misadventure at the time of the procedure: Secondary | ICD-10-CM | POA: Diagnosis present

## 2019-09-25 DIAGNOSIS — D6851 Activated protein C resistance: Secondary | ICD-10-CM | POA: Diagnosis present

## 2019-09-25 DIAGNOSIS — F329 Major depressive disorder, single episode, unspecified: Secondary | ICD-10-CM

## 2019-09-25 DIAGNOSIS — R7989 Other specified abnormal findings of blood chemistry: Secondary | ICD-10-CM | POA: Diagnosis not present

## 2019-09-25 DIAGNOSIS — R079 Chest pain, unspecified: Secondary | ICD-10-CM | POA: Diagnosis not present

## 2019-09-25 DIAGNOSIS — R1084 Generalized abdominal pain: Secondary | ICD-10-CM

## 2019-09-25 DIAGNOSIS — K520 Gastroenteritis and colitis due to radiation: Secondary | ICD-10-CM | POA: Diagnosis present

## 2019-09-25 DIAGNOSIS — G8929 Other chronic pain: Secondary | ICD-10-CM | POA: Diagnosis not present

## 2019-09-25 DIAGNOSIS — D6959 Other secondary thrombocytopenia: Secondary | ICD-10-CM | POA: Diagnosis present

## 2019-09-25 DIAGNOSIS — R197 Diarrhea, unspecified: Secondary | ICD-10-CM

## 2019-09-25 DIAGNOSIS — K219 Gastro-esophageal reflux disease without esophagitis: Secondary | ICD-10-CM | POA: Diagnosis present

## 2019-09-25 DIAGNOSIS — Z20828 Contact with and (suspected) exposure to other viral communicable diseases: Secondary | ICD-10-CM | POA: Diagnosis present

## 2019-09-25 DIAGNOSIS — D638 Anemia in other chronic diseases classified elsewhere: Secondary | ICD-10-CM

## 2019-09-25 DIAGNOSIS — D6481 Anemia due to antineoplastic chemotherapy: Secondary | ICD-10-CM | POA: Diagnosis present

## 2019-09-25 DIAGNOSIS — K521 Toxic gastroenteritis and colitis: Principal | ICD-10-CM | POA: Diagnosis present

## 2019-09-25 DIAGNOSIS — Z66 Do not resuscitate: Secondary | ICD-10-CM | POA: Diagnosis present

## 2019-09-25 DIAGNOSIS — E2740 Unspecified adrenocortical insufficiency: Secondary | ICD-10-CM | POA: Diagnosis present

## 2019-09-25 DIAGNOSIS — Z5111 Encounter for antineoplastic chemotherapy: Secondary | ICD-10-CM | POA: Diagnosis present

## 2019-09-25 DIAGNOSIS — G629 Polyneuropathy, unspecified: Secondary | ICD-10-CM | POA: Diagnosis not present

## 2019-09-25 DIAGNOSIS — R112 Nausea with vomiting, unspecified: Secondary | ICD-10-CM | POA: Diagnosis not present

## 2019-09-25 DIAGNOSIS — R918 Other nonspecific abnormal finding of lung field: Secondary | ICD-10-CM | POA: Diagnosis not present

## 2019-09-25 DIAGNOSIS — C7801 Secondary malignant neoplasm of right lung: Secondary | ICD-10-CM | POA: Diagnosis present

## 2019-09-25 DIAGNOSIS — R1030 Lower abdominal pain, unspecified: Secondary | ICD-10-CM

## 2019-09-25 DIAGNOSIS — Z8701 Personal history of pneumonia (recurrent): Secondary | ICD-10-CM

## 2019-09-25 DIAGNOSIS — Z923 Personal history of irradiation: Secondary | ICD-10-CM

## 2019-09-25 DIAGNOSIS — K5900 Constipation, unspecified: Secondary | ICD-10-CM | POA: Diagnosis not present

## 2019-09-25 DIAGNOSIS — C7800 Secondary malignant neoplasm of unspecified lung: Secondary | ICD-10-CM | POA: Diagnosis present

## 2019-09-25 DIAGNOSIS — D696 Thrombocytopenia, unspecified: Secondary | ICD-10-CM | POA: Diagnosis not present

## 2019-09-25 DIAGNOSIS — C539 Malignant neoplasm of cervix uteri, unspecified: Secondary | ICD-10-CM | POA: Diagnosis present

## 2019-09-25 DIAGNOSIS — R109 Unspecified abdominal pain: Secondary | ICD-10-CM | POA: Diagnosis not present

## 2019-09-25 DIAGNOSIS — Z515 Encounter for palliative care: Secondary | ICD-10-CM | POA: Diagnosis present

## 2019-09-25 LAB — URINALYSIS, COMPLETE (UACMP) WITH MICROSCOPIC
Bacteria, UA: NONE SEEN
Bilirubin Urine: NEGATIVE
Glucose, UA: NEGATIVE mg/dL
Hgb urine dipstick: NEGATIVE
Ketones, ur: NEGATIVE mg/dL
Leukocytes,Ua: NEGATIVE
Nitrite: NEGATIVE
Protein, ur: NEGATIVE mg/dL
Specific Gravity, Urine: 1.02 (ref 1.005–1.030)
Squamous Epithelial / HPF: NONE SEEN (ref 0–5)
pH: 5 (ref 5.0–8.0)

## 2019-09-25 LAB — PROTIME-INR
INR: 1 (ref 0.8–1.2)
Prothrombin Time: 13.3 seconds (ref 11.4–15.2)

## 2019-09-25 LAB — CBC
HCT: 33.4 % — ABNORMAL LOW (ref 36.0–46.0)
Hemoglobin: 11.5 g/dL — ABNORMAL LOW (ref 12.0–15.0)
MCH: 33.5 pg (ref 26.0–34.0)
MCHC: 34.4 g/dL (ref 30.0–36.0)
MCV: 97.4 fL (ref 80.0–100.0)
Platelets: 89 10*3/uL — ABNORMAL LOW (ref 150–400)
RBC: 3.43 MIL/uL — ABNORMAL LOW (ref 3.87–5.11)
RDW: 14.7 % (ref 11.5–15.5)
WBC: 9.1 10*3/uL (ref 4.0–10.5)
nRBC: 0 % (ref 0.0–0.2)

## 2019-09-25 LAB — HEPATIC FUNCTION PANEL
ALT: 134 U/L — ABNORMAL HIGH (ref 0–44)
AST: 83 U/L — ABNORMAL HIGH (ref 15–41)
Albumin: 3.5 g/dL (ref 3.5–5.0)
Alkaline Phosphatase: 180 U/L — ABNORMAL HIGH (ref 38–126)
Bilirubin, Direct: 0.2 mg/dL (ref 0.0–0.2)
Indirect Bilirubin: 0.4 mg/dL (ref 0.3–0.9)
Total Bilirubin: 0.6 mg/dL (ref 0.3–1.2)
Total Protein: 7.4 g/dL (ref 6.5–8.1)

## 2019-09-25 LAB — BASIC METABOLIC PANEL
Anion gap: 11 (ref 5–15)
BUN: 14 mg/dL (ref 6–20)
CO2: 22 mmol/L (ref 22–32)
Calcium: 9.3 mg/dL (ref 8.9–10.3)
Chloride: 101 mmol/L (ref 98–111)
Creatinine, Ser: 0.69 mg/dL (ref 0.44–1.00)
GFR calc Af Amer: >60 mL/min (ref >60)
GFR calc non Af Amer: >60 mL/min (ref >60)
Glucose, Bld: 108 mg/dL — ABNORMAL HIGH (ref 70–99)
Potassium: 3.6 mmol/L (ref 3.5–5.1)
Sodium: 134 mmol/L — ABNORMAL LOW (ref 135–145)

## 2019-09-25 LAB — LACTIC ACID, PLASMA: Lactic Acid, Venous: 1.6 mmol/L (ref 0.5–1.9)

## 2019-09-25 LAB — TROPONIN I (HIGH SENSITIVITY)
Troponin I (High Sensitivity): 13 ng/L (ref <18)
Troponin I (High Sensitivity): 8 ng/L (ref <18)

## 2019-09-25 LAB — PREGNANCY, URINE: Preg Test, Ur: NEGATIVE

## 2019-09-25 LAB — HEPARIN LEVEL (UNFRACTIONATED): Heparin Unfractionated: 0.3 IU/mL (ref 0.30–0.70)

## 2019-09-25 LAB — APTT: aPTT: 26 seconds (ref 24–36)

## 2019-09-25 LAB — LIPASE, BLOOD: Lipase: 23 U/L (ref 11–51)

## 2019-09-25 MED ORDER — MORPHINE SULFATE (PF) 2 MG/ML IV SOLN
2.0000 mg | INTRAVENOUS | Status: DC | PRN
  Administered 2019-09-25 – 2019-09-29 (×16): 2 mg via INTRAVENOUS
  Filled 2019-09-25 (×20): qty 1

## 2019-09-25 MED ORDER — ONDANSETRON HCL 4 MG/2ML IJ SOLN
4.0000 mg | Freq: Four times a day (QID) | INTRAMUSCULAR | Status: DC
  Administered 2019-09-25 – 2019-09-29 (×16): 4 mg via INTRAVENOUS
  Filled 2019-09-25 (×16): qty 2

## 2019-09-25 MED ORDER — PROMETHAZINE HCL 25 MG PO TABS
12.5000 mg | ORAL_TABLET | Freq: Four times a day (QID) | ORAL | Status: DC | PRN
  Administered 2019-09-25 – 2019-09-29 (×7): 12.5 mg via ORAL
  Filled 2019-09-25 (×7): qty 1

## 2019-09-25 MED ORDER — OXYCODONE HCL 5 MG PO TABS: 10.0000 mg | ORAL_TABLET | Freq: Once | ORAL | Status: DC

## 2019-09-25 MED ORDER — OXYCODONE HCL 5 MG PO TABS
10.0000 mg | ORAL_TABLET | Freq: Four times a day (QID) | ORAL | Status: DC | PRN
  Administered 2019-09-26 (×2): 20 mg via ORAL
  Administered 2019-09-26 – 2019-09-28 (×4): 10 mg via ORAL
  Administered 2019-09-28: 20 mg via ORAL
  Administered 2019-09-29: 10 mg via ORAL
  Filled 2019-09-25 (×2): qty 4
  Filled 2019-09-25 (×5): qty 2
  Filled 2019-09-25: qty 4

## 2019-09-25 MED ORDER — CIPROFLOXACIN IN D5W 400 MG/200ML IV SOLN
400.0000 mg | Freq: Once | INTRAVENOUS | Status: AC
  Administered 2019-09-25: 400 mg via INTRAVENOUS
  Filled 2019-09-25: qty 200

## 2019-09-25 MED ORDER — AMITRIPTYLINE HCL 50 MG PO TABS
75.0000 mg | ORAL_TABLET | Freq: Every day | ORAL | Status: DC
  Administered 2019-09-25 – 2019-09-28 (×4): 75 mg via ORAL
  Filled 2019-09-25 (×5): qty 1.5

## 2019-09-25 MED ORDER — HYOSCYAMINE SULFATE ER 0.375 MG PO TB12
0.3750 mg | ORAL_TABLET | Freq: Two times a day (BID) | ORAL | Status: DC | PRN
  Administered 2019-09-27 – 2019-09-29 (×4): 0.375 mg via ORAL
  Filled 2019-09-25 (×6): qty 1

## 2019-09-25 MED ORDER — CIPROFLOXACIN IN D5W 400 MG/200ML IV SOLN
400.0000 mg | Freq: Two times a day (BID) | INTRAVENOUS | Status: DC
  Administered 2019-09-26 – 2019-09-29 (×7): 400 mg via INTRAVENOUS
  Filled 2019-09-25 (×8): qty 200

## 2019-09-25 MED ORDER — METRONIDAZOLE IN NACL 5-0.79 MG/ML-% IV SOLN
500.0000 mg | Freq: Three times a day (TID) | INTRAVENOUS | Status: DC
  Administered 2019-09-25 – 2019-09-29 (×11): 500 mg via INTRAVENOUS
  Filled 2019-09-25 (×13): qty 100

## 2019-09-25 MED ORDER — SODIUM CHLORIDE 0.9 % IV BOLUS
1000.0000 mL | Freq: Once | INTRAVENOUS | Status: AC
  Administered 2019-09-25: 1000 mL via INTRAVENOUS

## 2019-09-25 MED ORDER — POTASSIUM CHLORIDE IN NACL 20-0.9 MEQ/L-% IV SOLN
INTRAVENOUS | Status: DC
  Administered 2019-09-25 – 2019-09-29 (×5): via INTRAVENOUS
  Filled 2019-09-25 (×8): qty 1000

## 2019-09-25 MED ORDER — FOLIC ACID 1 MG PO TABS
1.0000 mg | ORAL_TABLET | Freq: Every day | ORAL | Status: DC
  Administered 2019-09-26 – 2019-09-29 (×4): 1 mg via ORAL
  Filled 2019-09-25 (×4): qty 1

## 2019-09-25 MED ORDER — SERTRALINE HCL 50 MG PO TABS
25.0000 mg | ORAL_TABLET | Freq: Every day | ORAL | Status: DC
  Administered 2019-09-26 – 2019-09-29 (×4): 25 mg via ORAL
  Filled 2019-09-25 (×4): qty 1

## 2019-09-25 MED ORDER — HYDROMORPHONE HCL 1 MG/ML IJ SOLN
1.0000 mg | Freq: Once | INTRAMUSCULAR | Status: AC
  Administered 2019-09-25: 1 mg via INTRAVENOUS
  Filled 2019-09-25: qty 1

## 2019-09-25 MED ORDER — ENOXAPARIN SODIUM 40 MG/0.4ML ~~LOC~~ SOLN
40.0000 mg | SUBCUTANEOUS | Status: DC
  Filled 2019-09-25: qty 0.4

## 2019-09-25 MED ORDER — ONDANSETRON HCL 4 MG/2ML IJ SOLN
4.0000 mg | Freq: Once | INTRAMUSCULAR | Status: AC
  Administered 2019-09-25: 4 mg via INTRAVENOUS
  Filled 2019-09-25: qty 2

## 2019-09-25 MED ORDER — PANTOPRAZOLE SODIUM 40 MG IV SOLR
40.0000 mg | Freq: Two times a day (BID) | INTRAVENOUS | Status: DC
  Administered 2019-09-25 – 2019-09-28 (×8): 40 mg via INTRAVENOUS
  Filled 2019-09-25 (×11): qty 40

## 2019-09-25 MED ORDER — MORPHINE SULFATE (PF) 4 MG/ML IV SOLN
4.0000 mg | Freq: Once | INTRAVENOUS | Status: AC
  Administered 2019-09-25: 4 mg via INTRAVENOUS
  Filled 2019-09-25: qty 1

## 2019-09-25 MED ORDER — ACETAMINOPHEN 325 MG PO TABS: 650.0000 mg | ORAL_TABLET | Freq: Four times a day (QID) | ORAL | Status: DC | PRN

## 2019-09-25 MED ORDER — ALBUTEROL SULFATE (2.5 MG/3ML) 0.083% IN NEBU: 2.5000 mg | INHALATION_SOLUTION | Freq: Four times a day (QID) | RESPIRATORY_TRACT | Status: DC | PRN

## 2019-09-25 MED ORDER — DIPHENOXYLATE-ATROPINE 2.5-0.025 MG PO TABS
2.0000 | ORAL_TABLET | Freq: Two times a day (BID) | ORAL | Status: DC | PRN
  Administered 2019-09-26 – 2019-09-27 (×4): 2 via ORAL
  Filled 2019-09-25 (×4): qty 2

## 2019-09-25 MED ORDER — ACETAMINOPHEN 650 MG RE SUPP: 650.0000 mg | Freq: Four times a day (QID) | RECTAL | Status: DC | PRN

## 2019-09-25 NOTE — ED Notes (Signed)
EKG completed in triage.

## 2019-09-25 NOTE — ED Triage Notes (Signed)
First RN Note: Pt presents to ED via [facility], per EMS pt has hx of cancer that is metastatic and worsening. EMS reports pt c/o bloody stools and CP today. EMS reports pt given 15m Zofran IM en route, pt can be heard dry heaving upon arrival to ED. EMS reports pt with cough, metastatic cancer to lungs, EMS reporting pt wanting to stop chemo. Per EMS pt c/o bloody stools x 1 day.      124/83 95-105HR 98-100%RA CBG 123

## 2019-09-25 NOTE — ED Triage Notes (Signed)
Arrived by EMS from home for CP, diarrhea, nausea, and bloody stools. HX of metastatic cancer to lungs and worsening. Last chemo treatment last Thursday.

## 2019-09-25 NOTE — H&P (Addendum)
[facility] NAME: Joanna Hall    MR#:  [id number]  DATE OF BIRTH:  1965-11-29  DATE OF ADMISSION:  09/25/2019  PRIMARY CARE PHYSICIAN: Delsa Grana, PA-C   REQUESTING/REFERRING PHYSICIAN: Dr. Derrell Lolling  CHIEF COMPLAINT:   Chief Complaint  Patient presents with  . Chest Pain  . Diarrhea  . Nausea    HISTORY OF PRESENT ILLNESS:  Joanna Hall  is a 53 y.o. 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  She presents with nausea vomiting and diarrhea.  She is also having abdominal pain and chest pain.  Abdominal pain described as an aching 7 out of 10 intensity.  Nothing makes it better or worse.  No blood in the vomit.  Diarrhea is dark and mucousy and sinks to the bottom of the water.  Chest pain described as severe tightness, especially when she is vomiting.  She came to the ER on Monday and was sent home with a CAT scan diagnosing colitis.  Symptoms have been going on for 4 days.  Patient has had repeated admissions for similar episodes.  Patient also complains of shortness of breath with ambulating.  PAST MEDICAL HISTORY:   Past Medical History:  Diagnosis Date  . Abdominal pain 06/10/2018  . Abnormal cervical Papanicolaou smear 09/18/2017  . Anxiety   . Aortic atherosclerosis ([place])   . Arthritis    neck and knees  . 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 Blood transfusion without reported diagnosis   . Cervical cancer ([place]) 09/2016   mets lung  . Chronic anal fissure   . Chronic diarrhea   . Dyspnea   . Erosive gastropathy 09/18/2017  . Factor V Leiden mutation ([place])   . Fecal incontinence   . Genital warts   . GERD (gastroesophageal reflux disease)   . GI bleed 10/08/2018  . Heart murmur   . Hematochezia   . Hemorrhoids   . 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 Hepatitis, chronic ([place]) 05/05/2017  . History of cancer chemotherapy     completed 06/2017  . History of Clostridium difficile infection    while undergoing chemo.  Negative test 10/2017  . Ileocolic anastomotic leak   . 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 Intestinal infection due to Clostridium difficile 09/18/2017  . Macrocytic anemia with vitamin B12 deficiency   . Multiple gastric ulcers   . Nausea vomiting and diarrhea   . Pancolitis ([place]) 07/27/2018  . Perianal condylomata   . Pneumonia    History of  . Pulmonary nodules   . Rectal bleeding   . Small bowel obstruction ([place]) 08/2017  . Stiff neck    limited right turn  . Vitamin D deficiency     PAST SURGICAL HISTORY:   Past Surgical History:  Procedure Laterality Date  . CHOLECYSTECTOMY    . COLON SURGERY  08/2017   resection  . COLONOSCOPY WITH PROPOFOL N/A 12/20/2017   Procedure: COLONOSCOPY WITH PROPOFOL;  Surgeon: [name], MD;  Location: [facility] ENDOSCOPY;  Service: Gastroenterology;  Laterality: N/A;  . COLONOSCOPY WITH PROPOFOL N/A 07/30/2018   Procedure: COLONOSCOPY WITH PROPOFOL;  Surgeon: [name], MD;  Location: [facility] ENDOSCOPY;  Service: Gastroenterology;  Laterality: N/A;  . COLONOSCOPY WITH PROPOFOL N/A 10/10/2018   Procedure: COLONOSCOPY WITH PROPOFOL;  Surgeon: Lucilla Lame,  MD;  Location: [facility] ENDOSCOPY;  Service: Endoscopy;  Laterality: N/A;  . DIAGNOSTIC LAPAROSCOPY    . ESOPHAGOGASTRODUODENOSCOPY (EGD) WITH PROPOFOL N/A 12/20/2017   Procedure: ESOPHAGOGASTRODUODENOSCOPY (EGD) WITH PROPOFOL;  Surgeon: [name], MD;  Location: [place];  Service: Gastroenterology;  Laterality: N/A;  . ESOPHAGOGASTRODUODENOSCOPY (EGD) WITH PROPOFOL  07/30/2018   Procedure: ESOPHAGOGASTRODUODENOSCOPY (EGD) WITH PROPOFOL;  Surgeon: [name], MD;  Location: [facility] ENDOSCOPY;  Service: Gastroenterology;;  . Otho Darner SIGMOIDOSCOPY N/A 11/21/2018   Procedure: FLEXIBLE SIGMOIDOSCOPY;  Surgeon: [name], MD;  Location: [facility] ENDOSCOPY;   Service: Gastroenterology;  Laterality: N/A;  . LAPAROTOMY N/A 08/31/2017   Procedure: EXPLORATORY LAPAROTOMY for SBO, ileocolectomy, removal of piece of uterine wall;  Surgeon: [name], MD;  Location: [facility] ORS;  Service: General;  Laterality: N/A;  . LASER ABLATION CONDOLAMATA N/A 02/22/2018   Procedure: LASER ABLATION/REMOVAL OF [id number] AROUND ANUS AND VAGINA;  Surgeon: Michael [place], MD;  Location: [place];  Service: General;  Laterality: N/A;  . OOPHORECTOMY    . PORTA CATH INSERTION N/A 05/13/2018   Procedure: PORTA CATH INSERTION;  Surgeon: Katha Cabal, MD;  Location: [place] CV LAB;  Service: Cardiovascular;  Laterality: N/A;  . SMALL INTESTINE SURGERY    . TANDEM RING INSERTION     x3  . THORACOTOMY    . TOTAL KNEE ARTHROPLASTY Left 04/24/2018   Procedure: TOTAL KNEE ARTHROPLASTY;  Surgeon: Lovell Sheehan, MD;  Location: [facility] ORS;  Service: Orthopedics;  Laterality: Left;    SOCIAL HISTORY:   Social History   Tobacco Use  . Smoking status: Former Smoker    Packs/day: 0.25    Years: 10.00    Pack years: 2.50    Types: Cigarettes    Quit date: 10/16/2006    Years since quitting: 12.9  . Smokeless tobacco: Never Used  Substance Use Topics  . Alcohol use: Not Currently    Comment: seldom    FAMILY HISTORY:   Family History  Problem Relation Age of Onset  . Hypertension Mother   . Hypertension Father   . Diabetes Father   . Hyperlipidemia Father   . Alcohol abuse Daughter   . Hypertension Maternal Grandmother   . Diabetes Maternal Grandmother   . Diabetes Paternal Grandmother   . Hypertension Paternal Grandmother     DRUG ALLERGIES:   Allergies  Allergen Reactions  . Ketamine Anxiety and Other (See Comments)    Syncope episode/confusion     REVIEW OF SYSTEMS:  CONSTITUTIONAL: Positive for fever, chills and sweats.  Positive for weight loss.  Positive for fatigue.  EYES: No blurred or double vision.  EARS, NOSE, AND  THROAT: No tinnitus or ear pain. No sore throat RESPIRATORY: No cough.  Positive for shortness of breath.  No wheezing or hemoptysis.  CARDIOVASCULAR: Positive for chest pai.no orthopnea, edema.  GASTROINTESTINAL: Positive for nausea, vomiting, diarrhea and abdominal pain.  No hematemesis.  No blood in bowel movements GENITOURINARY: Positive for dysuria.no hematuria.  ENDOCRINE: No polyuria, nocturia,  HEMATOLOGY: No anemia, easy bruising or bleeding SKIN: No rash or lesion. MUSCULOSKELETAL: Positive for joint pains NEUROLOGIC: No tingling, numbness, weakness.  PSYCHIATRY: History of anxiety and depression.   MEDICATIONS AT HOME:   Prior to Admission medications   Medication Sig Start Date End Date Taking? Authorizing Provider  amitriptyline (ELAVIL) 75 MG tablet Take 1 tablet (75 mg total) by mouth at bedtime. 05/06/19 09/25/19 Yes [name], MD  amoxicillin-clavulanate (AUGMENTIN) 875-125 MG tablet  Take 1 tablet by mouth 2 (two) times daily for 10 days. 09/22/19 10/02/19 Yes Arta Silence, MD  Calcium Carb-Cholecalciferol (CALCIUM 500 +D) 500-400 MG-UNIT TABS Take 2 tablets by mouth daily.   Yes [provider]  folic acid (FOLVITE) 1 MG tablet Take 1 tablet (1 mg total) by mouth daily. 07/31/19  Yes Cammie Sickle, MD  meloxicam (MOBIC) 15 MG tablet Take 15 mg by mouth daily as needed for pain.   Yes [provider]  pantoprazole (PROTONIX) 40 MG tablet Take 1 tablet (40 mg total) by mouth daily. 03/18/19  Yes Cammie Sickle, MD  potassium chloride SA (KLOR-CON) 20 MEQ tablet Take 1 tablet (20 mEq total) by mouth 2 (two) times daily. 09/10/19  Yes Verlon Au, NP  rivaroxaban (XARELTO) 20 MG TABS tablet Take 1 tablet (20 mg total) by mouth daily with supper. Patient taking differently: Take 20 mg by mouth daily with supper. Takes in the morning 05/05/19  Yes Cammie Sickle, MD  diphenoxylate-atropine (LOMOTIL) 2.5-0.025 MG tablet TAKE  (2) TABLETS FOUR TIMES DAILY. Patient taking differently: Take 2 tablets by mouth 2 (two) times daily as needed for diarrhea or loose stools.  04/21/19   [name], MD  famotidine (PEPCID) 20 MG tablet Take 1 tablet (20 mg total) by mouth 2 (two) times daily for 5 days. Patient taking differently: Take 20 mg by mouth 2 (two) times daily as needed.  05/23/19 09/22/19  Duffy Bruce, MD  hyoscyamine (LEVBID) 0.375 MG 12 hr tablet Take 1 tablet (0.375 mg total) by mouth 2 (two) times daily. Patient taking differently: Take 0.375 mg by mouth 2 (two) times daily as needed for cramping.  08/05/19   [name], MD  Oxycodone HCl 10 MG TABS Take 1-2 tablets (10-20 mg total) by mouth every 6 (six) hours as needed (for breakthrough pain). 09/16/19   Borders, Kirt Boys, NP  promethazine (PHENERGAN) 25 MG tablet Take 1 tablet (25 mg total) by mouth every 8 (eight) hours as needed for nausea or vomiting. 08/25/19   Cammie Sickle, MD  traMADol (ULTRAM) 50 MG tablet Take 50 mg by mouth every 6 (six) hours as needed.    [provider]  VENTOLIN HFA 108 (90 Base) MCG/ACT inhaler Inhale 1-2 puffs into the lungs every 4 (four) hours as needed for shortness of breath. 02/19/19   Verlon Au, NP   Patient states she is also on Zoloft.  Medication reconciliation still undergoing.  VITAL SIGNS:  Blood pressure 132/90, pulse 96, temperature 98.6 F (37 C), temperature source Oral, resp. rate 20, height 5' 8"  (1.727 m), weight 74.4 kg, SpO2 99 %.  PHYSICAL EXAMINATION:  GENERAL:  53 y.o.-year-old patient lying in the bed with no acute distress.  EYES: Pupils equal, round, reactive to light and accommodation. No scleral icterus. Extraocular muscles intact.  HEENT: Head atraumatic, normocephalic. Oropharynx and nasopharynx clear.  NECK:  Supple, no jugular venous distention. No thyroid enlargement, no tenderness.  LUNGS: Decreased breath sounds bilateral bases, no wheezing, rales,rhonchi  or crepitation. No use of accessory muscles of respiration.  CARDIOVASCULAR: S1, S2 normal. No murmurs, rubs, or gallops.  ABDOMEN: Soft, generalized tenderness, nondistended. Bowel sounds present. No organomegaly or mass.  EXTREMITIES: No pedal edema, cyanosis, or clubbing.  NEUROLOGIC: Cranial nerves II through XII are intact. Muscle strength 5/5 in all extremities. Sensation intact. Gait not checked.  PSYCHIATRIC: The patient is alert and oriented x 3.  SKIN: No rash, lesion,  or ulcer.   LABORATORY PANEL:   CBC Recent Labs  Lab 09/25/19 0810  WBC 9.1  HGB 11.5*  HCT 33.4*  PLT 89*   ------------------------------------------------------------------------------------------------------------------  Chemistries  Recent Labs  Lab 09/25/19 0810 09/25/19 1338  NA 134*  --   K 3.6  --   CL 101  --   CO2 22  --   GLUCOSE 108*  --   BUN 14  --   CREATININE 0.69  --   CALCIUM 9.3  --   AST  --  83*  ALT  --  134*  ALKPHOS  --  180*  BILITOT  --  0.6   ------------------------------------------------------------------------------------------------------------------    RADIOLOGY:  DG Chest 2 View  Result Date: 09/25/2019 CLINICAL DATA:  History of metastatic cervical cancer. Chest pain, diarrhea, nausea and blood in stool. EXAM: CHEST - 2 VIEW COMPARISON:  CT chest 08/01/2019. Single-view of the chest 01/26/2019 FINDINGS: Scattered pulmonary nodules are seen but are better visualized on the prior CT scan. Lungs are otherwise clear without consolidative process or edema. No pneumothorax or pleural effusion. Heart size is normal. No acute or focal bony abnormality. Port-A-Cath is in place. IMPRESSION: No acute disease in patient with bilateral pulmonary nodules from cervical carcinoma as seen on prior CT. Electronically Signed   By: Inge Rise M.D.   On: 09/25/2019 08:44    EKG:   Interpreted by me shows a normal sinus rhythm 99 bpm, left atrial  enlargement  IMPRESSION AND PLAN:   1.  Acute colitis with nausea vomiting diarrhea and abdominal pain..  Likely due to chemotherapy and/or prior radiation.  ER physician ordered stool study but numerous previous stool studies were negative.  Empiric antibiotics for right now.  As needed nausea medications and pain medications.  Standing dose Zofran.  GI consultation.  Empiric Cipro and Flagyl for now.  IV fluid hydration with potassium. 2.  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  Thrombocytopenia secondary to recent chemotherapy.  Follow-up with Dr. Rogue Bussing oncology.  Patient is a DO NOT RESUSCITATE. 3.  Elevated liver function tests, history of hepatitis C 4.  Anxiety depression continue Zoloft and Elavil. 5.  Chest pain likely secondary to persistent vomiting. 6.  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  Since the patient was on Xarelto as outpatient I will hold that since she has persistent nausea vomiting and start heparin drip for right now.  Just in case GI wants to do a procedure. 7.  Arthritis hold meloxicam. 8.  DNR present on admission   All the records including radiological and laboratory data are reviewed and case discussed with ED provider. Management plans discussed with the patient, and she is in agreement. Case also discussed with gastroenterology and oncology.  CODE STATUS: DNR  TOTAL TIME TAKING CARE OF THIS PATIENT: 50 minutes.    Loletha Grayer M.D on 09/25/2019 at 3:13 PM  Between 7am to 6pm - Pager - [phone]  After 6pm call admission pager [phone]  [facility]  CC: Primary care physician; Delsa Grana, PA-C

## 2019-09-25 NOTE — Progress Notes (Signed)
[place] for heparin Indication: Factor V deficiency/clots  Allergies  Allergen Reactions  . Ketamine Anxiety and Other (See Comments)    Syncope episode/confusion     Patient Measurements: Height: 5' 8"  (172.7 cm) Weight: 164 lb (74.4 kg) IBW/kg (Calculated) : 63.9 Heparin Dosing Weight: 74 kg  Vital Signs: Temp: 98.6 F (37 C) (12/10 1348) Temp Source: Oral (12/10 1348) BP: 132/90 (12/10 1348) Pulse Rate: 96 (12/10 1348)  Labs: Recent Labs    09/22/19 1638 09/22/19 1835 09/25/19 0810 09/25/19 1339  HGB 13.6  --  11.5*  --   HCT 39.7  --  33.4*  --   PLT 215  --  89*  --   LABPROT  --   --  13.3  --   INR  --   --  1.0  --   CREATININE 0.59  --  0.69  --   TROPONINIHS 9 15 13 8     Estimated Creatinine Clearance: 83 mL/min (by C-G formula based on SCr of 0.69 mg/dL).   Medical History: Past Medical History:  Diagnosis Date  . Abdominal pain 06/10/2018  . Abnormal cervical Papanicolaou smear 09/18/2017  . Anxiety   . Aortic atherosclerosis ([place])   . Arthritis    neck and knees  . 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 Blood transfusion without reported diagnosis   . Cervical cancer ([place]) 09/2016   mets lung  . Chronic anal fissure   . Chronic diarrhea   . Dyspnea   . Erosive gastropathy 09/18/2017  . Factor V Leiden mutation ([place])   . Fecal incontinence   . Genital warts   . GERD (gastroesophageal reflux disease)   . GI bleed 10/08/2018  . Heart murmur   . Hematochezia   . Hemorrhoids   . 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 Hepatitis, chronic ([place]) 05/05/2017  . History of cancer chemotherapy    completed 06/2017  . History of Clostridium difficile infection    while undergoing chemo.  Negative test 10/2017  . Ileocolic anastomotic leak   . 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 Intestinal infection due to Clostridium difficile 09/18/2017  .  Macrocytic anemia with vitamin B12 deficiency   . Multiple gastric ulcers   . Nausea vomiting and diarrhea   . Pancolitis ([place]) 07/27/2018  . Perianal condylomata   . Pneumonia    History of  . Pulmonary nodules   . Rectal bleeding   . Small bowel obstruction ([place]) 08/2017  . Stiff neck    limited right turn  . 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 Patient takes 20 mg daily. Pharmacy consulted for heparin drip. Xarelto on hold. Last dose of Xarelto reported 12/10 am.  Goal of Therapy:  Heparin level 0.3-0.7 units/ml aPTT 66-102 seconds Monitor platelets by anticoagulation protocol: Yes   Plan:  Discussed patient with Dr. Leslye Peer. No concern for new or worsening clot, so will start heparin approximately 24 hours after last dose of Xarelto. Patient last took Xarelto this morning per med rec. Will start heparin drip 12/11 am at 0800.  Will order heparin 4000 unit bolus followed by heparin drip at 1100 units/hr to start 12/11 at 0800. Will follow APTT until correlation with HL. APTT 6 hr after start  of drip. CBC daily while on heparin drip.   Tawnya Crook, PharmD 09/25/2019,4:06 PM

## 2019-09-25 NOTE — Progress Notes (Signed)
Pharmacy Antibiotic Note  Joanna Hall is a 53 y.o. female admitted on 09/25/2019. Pharmacy has been consulted for ciprofloxacin dosing for intra-abdominal infection.  Plan: Ciprofloxacin 400 mg IV BID  Height: 5' 8"  (172.7 cm) Weight: 164 lb (74.4 kg) IBW/kg (Calculated) : 63.9  Temp (24hrs), Avg:98.9 F (37.2 C), Min:98.6 F (37 C), Max:99.1 F (37.3 C)  Recent Labs  Lab 09/22/19 1638 09/22/19 1835 09/22/19 2141 09/25/19 0810 09/25/19 1338  WBC 16.3*  --   --  9.1  --   CREATININE 0.59  --   --  0.69  --   LATICACIDVEN 2.7* 2.1* 1.0  --  1.6    Estimated Creatinine Clearance: 83 mL/min (by C-G formula based on SCr of 0.69 mg/dL).    Allergies  Allergen Reactions  . Ketamine Anxiety and Other (See Comments)    Syncope episode/confusion     Antimicrobials this admission: Ciprofloxacin 12/10 >> Metronidazole 12/10 >>  Dose adjustments this admission: NA   Thank you for allowing pharmacy to be a part of this patient's care.  Tawnya Crook, PharmD 09/25/2019 4:20 PM

## 2019-09-25 NOTE — ED Notes (Signed)
Patient transported to X-ray 

## 2019-09-25 NOTE — ED Notes (Signed)
Port to [employer] accessed with 20g huber. meds and IVF infusing,

## 2019-09-25 NOTE — ED Notes (Signed)
Assumed care of patient patient reports abd pain and diarrhea seen this week for same c/o and diagnosed with colitis. Awaiting md eval and further plan of care.

## 2019-09-25 NOTE — ED Notes (Signed)
Covid swab obtained and sent to lab.

## 2019-09-25 NOTE — ED Provider Notes (Addendum)
[facility] Emergency Department Provider Note  ____________________________________________   First MD Initiated Contact with Patient 09/25/19 1130     (approximate)  I have reviewed the triage vital signs and the nursing notes.  History  Chief Complaint Chest Pain, Diarrhea, and Nausea    HPI Joanna Hall is a 53 y.o. female with a history of metastatic cervical cancer, followed by palliative care and receiving chemotherapy (last chemo was last week on Thursday), who presents emergency department for nausea, vomiting, diarrhea, bloody stools.  Seen here on 12/7 for vomiting, abdominal pain, and diagnosed with colitis based on CT.  Improved after fluids and symptomatic control, and elected for discharge on a course of Augmentin.  She reports since then she has been tolerating her Augmentin, however over the last 24 to 36 hours her pain and vomiting has increased substantially and she has not been able to keep anything down.  It is not well controlled with her oral pain medications.  She reports lower abdominal pain, sharp, 8/10 in severity.  No radiation, alleviating or aggravating factors.  She also reports associated chest pain with emesis.  She states every time she throws up she has a sharp, central burning type chest pain.  8/10 in severity as well.  No shortness of breath.  Today she had a bowel movement that looked deeply maroon.  She is on Xarelto. Prior to being prescribed Augmentin she was not on any recent antibiotics. Denies any prior bloody stool.   Past Medical Hx Past Medical History:  Diagnosis Date  . Abdominal pain 06/10/2018  . Abnormal cervical Papanicolaou smear 09/18/2017  . Anxiety   . Aortic atherosclerosis ([place])   . Arthritis    neck and knees  . 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 Blood transfusion without reported diagnosis   . Cervical cancer ([place]) 09/2016   mets lung  . Chronic anal fissure   .  Chronic diarrhea   . Dyspnea   . Erosive gastropathy 09/18/2017  . Factor V Leiden mutation ([place])   . Fecal incontinence   . Genital warts   . GERD (gastroesophageal reflux disease)   . GI bleed 10/08/2018  . Heart murmur   . Hematochezia   . Hemorrhoids   . 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 Hepatitis, chronic ([place]) 05/05/2017  . History of cancer chemotherapy    completed 06/2017  . History of Clostridium difficile infection    while undergoing chemo.  Negative test 10/2017  . Ileocolic anastomotic leak   . 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 Intestinal infection due to Clostridium difficile 09/18/2017  . Macrocytic anemia with vitamin B12 deficiency   . Multiple gastric ulcers   . Nausea vomiting and diarrhea   . Pancolitis ([place]) 07/27/2018  . Perianal condylomata   . Pneumonia    History of  . Pulmonary nodules   . Rectal bleeding   . Small bowel obstruction ([place]) 08/2017  . Stiff neck    limited right turn  . Vitamin D deficiency     Problem List Patient Active Problem List   Diagnosis Date Noted  . Thrombocytopenia ([place]) 08/17/2019  . Intractable lower abdominal pain   . Abdominal pain 07/23/2019  . Constipation 07/22/2019  . Malnutrition of moderate degree 06/22/2019  . Small bowel obstruction due to adhesions ([place]) 06/21/2019  . Acute  gastroenteritis 06/14/2019  . Intractable nausea and vomiting 06/07/2019  . Chest pain in adult 05/19/2019  . Intractable pain 05/19/2019  . Cancer associated pain   . Proctitis, radiation   . Palliative care encounter   . Encounter for monitoring opioid maintenance therapy 11/19/2018  . Adrenal insufficiency ([place]) 09/03/2018  . Chronic diarrhea 06/25/2018  . Chronic anticoagulation 06/25/2018  . Vomiting   . Encounter for antineoplastic chemotherapy 06/04/2018  . 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 Closed fracture of distal end of  radius 05/04/2018  . History of total knee arthroplasty 04/24/2018  . Osteoarthritis of left knee 02/28/2018  . Condyloma acuminatum of anus s/p ablation 02/22/2018 02/22/2018  . Condyloma acuminatum of vagina s/p ablation 02/22/2018 02/22/2018  . Positive ANA (antinuclear antibody) 02/04/2018  . Aortic atherosclerosis ([place]) 01/15/2018  . Elevated MCV 01/15/2018  . Anemia 01/15/2018  . Genital warts 01/15/2018  . Vitamin D deficiency 01/15/2018  . Pernicious anemia   . Impingement syndrome of shoulder region 12/19/2017  . Multiple joint pain 12/19/2017  . Osteoarthritis of right knee 12/19/2017  . B12 deficiency 12/11/2017  . Lung nodules 12/11/2017  . History of Clostridium difficile infection 10/23/2017  . Factor V Leiden ([place]) 10/19/2017  . Goals of care, counseling/discussion 10/19/2017  . Cervical cancer, FIGO stage IB1 ([place]) 09/18/2017  . Chronic hepatitis C without hepatic coma ([place]) 09/18/2017  . Cytopenia 09/18/2017  . Diarrhea 09/18/2017  . Lower abdominal pain 09/18/2017  . 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 Malignant neoplasm of overlapping sites of cervix ([place]) 09/18/2017  . Renal insufficiency 09/18/2017  . Wound infection after surgery 09/12/2017  . Hypokalemia   . Hypomagnesemia   . Cervical arthritis 07/18/2017  . Dysuria 06/20/2017  . Metastatic cancer ([place]) 05/12/2017  . Essential hypertension 03/15/2017  . Anemia in other chronic diseases classified elsewhere 03/01/2017  . Chemotherapy-induced neutropenia ([place]) 01/29/2017  . 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 CHOLECYSTECTOMY    . COLON SURGERY  08/2017   resection  . COLONOSCOPY WITH PROPOFOL N/A 12/20/2017   Procedure: COLONOSCOPY WITH PROPOFOL;  Surgeon: [name], MD;  Location: [facility] ENDOSCOPY;  Service: Gastroenterology;  Laterality: N/A;  . COLONOSCOPY WITH PROPOFOL N/A 07/30/2018   Procedure:  COLONOSCOPY WITH PROPOFOL;  Surgeon: [name], MD;  Location: [facility] ENDOSCOPY;  Service: Gastroenterology;  Laterality: N/A;  . COLONOSCOPY WITH PROPOFOL N/A 10/10/2018   Procedure: COLONOSCOPY WITH PROPOFOL;  Surgeon: Lucilla Lame, MD;  Location[facility] END[facility]COPY;  Service: Endoscopy;  Laterality: N/A;  . DIAGNOSTIC LAPAROSCOPY    . ESOPHAGOGASTRODUODENOSCOPY (EGD) WITH PROPOFOL N/A 12/20/2017   Procedure: ESOPHAGOGASTRODUODENOSCOPY (EGD) WITH PROPOFOL;  Surgeon: [name], MD;  Location: [place];  Service: Gastroenterology;  Laterality: N/A;  . ESOPHAGOGASTRODUODENOSCOPY (EGD) WITH PROPOFOL  07/30/2018   Procedure: ESOPHAGOGASTRODUODENOSCOPY (EGD) WITH PROPOFOL;  Surgeon: [name], MD;  Location: [facility] ENDOSCOPY;  Service: Gastroenterology;;  . Otho Darner SIGMOIDOSCOPY N/A 11/21/2018   Procedure: FLEXIBLE SIGMOIDOSCOPY;  Surgeon: [name], MD;  Location: [facility] ENDOSCOPY;  Service: Gastroenterology;  Laterality: N/A;  . LAPAROTOMY N/A 08/31/2017   Procedure: EXPLORATORY LAPAROTOMY for SBO, ileocolectomy, removal of piece of uterine wall;  Surgeon: [name], MD;  Location: [facility] ORS;  Service: General;  Laterality: N/A;  . LASER ABLATION CONDOLAMATA N/A 02/22/2018   Procedure: LASER ABLATION/REMOVAL OF [id number] AROUND ANUS AND VAGINA;  Surgeon: Michael Boston, MD;  Location: [place];  Service: General;  Laterality: N/A;  . OOPHORECTOMY    . PORTA CATH INSERTION N/A 05/13/2018   Procedure: PORTA CATH INSERTION;  Surgeon: Katha Cabal, MD;  Location: [place] CV LAB;  Service: Cardiovascular;  Laterality: N/A;  . SMALL INTESTINE SURGERY    . TANDEM RING INSERTION     x3  . THORACOTOMY    . TOTAL KNEE ARTHROPLASTY Left 04/24/2018   Procedure: TOTAL KNEE ARTHROPLASTY;  Surgeon: Lovell Sheehan, MD;  Location: [facility] ORS;  Service: Orthopedics;  Laterality: Left;    Medications Prior to Admission medications   Medication Sig  Start Date End Date Taking? Authorizing Provider  amitriptyline (ELAVIL) 75 MG tablet Take 1 tablet (75 mg total) by mouth at bedtime. 05/06/19 09/22/19  [name], MD  amoxicillin-clavulanate (AUGMENTIN) 875-125 MG tablet Take 1 tablet by mouth 2 (two) times daily for 10 days. 09/22/19 10/02/19  Arta Silence, MD  Calcium Carb-Cholecalciferol (CALCIUM 500 +D) 500-400 MG-UNIT TABS Take 2 tablets by mouth daily.    [provider]  colchicine 0.6 MG tablet Take 0.6 mg by mouth 2 (two) times daily. 06/19/19   [provider]  dexamethasone (DECADRON) 4 MG tablet Take one pill AM & PM x 3 days; start the day prior to chemo. 08/01/19   Cammie Sickle, MD  diphenoxylate-atropine (LOMOTIL) 2.5-0.025 MG tablet TAKE (2) TABLETS FOUR TIMES DAILY. 04/21/19   [name], MD  famotidine (PEPCID) 20 MG tablet Take 1 tablet (20 mg total) by mouth 2 (two) times daily for 5 days. Patient taking differently: Take 20 mg by mouth 2 (two) times daily as needed.  05/23/19 09/22/19  Duffy Bruce, MD  folic acid (FOLVITE) 1 MG tablet Take 1 tablet (1 mg total) by mouth daily. 07/31/19   Cammie Sickle, MD  hydrocortisone (CORTEF) 20 MG tablet Take 1 tablet (20 mg total) by mouth daily. 06/10/19   Gladstone Lighter, MD  hydrOXYzine (ATARAX/VISTARIL) 10 MG tablet Take 1 tablet (10 mg total) by mouth 3 (three) times daily as needed. 08/29/19   Cammie Sickle, MD  hyoscyamine (LEVBID) 0.375 MG 12 hr tablet Take 1 tablet (0.375 mg total) by mouth 2 (two) times daily. 08/05/19   [name], MD  ondansetron (ZOFRAN ODT) 4 MG disintegrating tablet Take 1 tablet (4 mg total) by mouth every 8 (eight) hours as needed for nausea or vomiting. 07/08/19   Cammie Sickle, MD  oxyCODONE (OXYCONTIN) 20 mg 12 hr tablet Take 1 tablet (20 mg total) by mouth every 8 (eight) hours. 09/16/19   Borders, Kirt Boys, NP  Oxycodone HCl 10 MG TABS Take 1-2 tablets (10-20 mg total) by  mouth every 6 (six) hours as needed (for breakthrough pain). 09/16/19   Borders, Kirt Boys, NP  pantoprazole (PROTONIX) 40 MG tablet Take 1 tablet (40 mg total) by mouth daily. 03/18/19   Cammie Sickle, MD  potassium chloride SA (KLOR-CON) 20 MEQ tablet Take 1 tablet (20 mEq total) by mouth 2 (two) times daily. 09/10/19   Verlon Au, NP  promethazine (PHENERGAN) 25 MG tablet Take 1 tablet (25 mg total) by mouth every 8 (eight) hours as needed for nausea or vomiting. 08/25/19   Cammie Sickle, MD  rivaroxaban (XARELTO) 20 MG TABS tablet Take 1 tablet (20 mg total) by mouth daily with supper. Patient taking differently: Take 20 mg by mouth daily with supper. Takes in the morning 05/05/19  Cammie Sickle, MD  sertraline (ZOLOFT) 25 MG tablet Take 25 mg PO before bedtime daily for 1 week, then take 50 mg (2 tabs) PO before bedtime daily 08/22/19   Delsa Grana, PA-C  traMADol (ULTRAM) 50 MG tablet Take 50 mg by mouth every 6 (six) hours as needed.    [provider]  VENTOLIN HFA 108 (90 Base) MCG/ACT inhaler Inhale 1-2 puffs into the lungs every 4 (four) hours as needed for shortness of breath. 02/19/19   Verlon Au, NP    Allergies Ketamine  Family Hx Family History  Problem Relation Age of Onset  . Hypertension Father   . Diabetes Father   . Alcohol abuse Daughter   . Hypertension Maternal Grandmother   . Diabetes Maternal Grandmother   . Diabetes Paternal Grandmother   . Hypertension Paternal Grandmother     Social Hx Social History   Tobacco Use  . Smoking status: Former Smoker    Packs/day: 0.25    Years: 10.00    Pack years: 2.50    Types: Cigarettes    Quit date: 10/16/2006    Years since quitting: 12.9  . Smokeless tobacco: Never Used  Substance Use Topics  . Alcohol use: Not Currently    Comment: seldom  . Drug use: Yes    Types: Marijuana    Comment: not very often     Review of Systems  Constitutional: Negative for fever, chills.  Eyes: Negative for visual changes. ENT: Negative for sore throat. Cardiovascular: + for chest pain. Respiratory: Negative for shortness of breath. Gastrointestinal: + for nausea, vomiting, abdominal pain, dark stool.  Genitourinary: Negative for dysuria. Musculoskeletal: Negative for leg swelling. Skin: Negative for rash. Neurological: Negative for for headaches.   Physical Exam  Vital Signs: ED Triage Vitals [09/25/19 0810]  Enc Vitals Group     BP 129/80     Pulse Rate (!) 103     Resp 18     Temp 99.1 F (37.3 C)     Temp Source Oral     SpO2 99 %     Weight 164 lb (74.4 kg)     Height 5' 8"  (1.727 m)     Head Circumference      Peak Flow      Pain Score 8     Pain Loc      Pain Edu?      Excl. in [place]?     Constitutional: Alert and oriented. Tearful, appears in pain.  Head: Normocephalic. Atraumatic. Eyes: Conjunctivae clear. Sclera anicteric. Nose: No congestion. No rhinorrhea. Mouth/Throat: MM dry.  Neck: No stridor.   Cardiovascular: HR low 100s, regular rhythm. Extremities well perfused. Respiratory: Normal respiratory effort.  Lungs CTAB. Gastrointestinal: Soft. TTP across lower abdomen. No rebound/guarding.  Rectal: RN chaperone present. Brown/yellow stool. Guaiac negative.  Musculoskeletal: No lower extremity edema. No deformities. Neurologic:  Normal speech and language. No gross focal neurologic deficits are appreciated.  Skin: Skin is warm, dry and intact. No rash noted. Psychiatric: Mood and affect are appropriate for situation.  EKG  Personally reviewed.   Rate: 99 Rhythm: sinus Axis: normal Intervals: WNL No acute ischemic changes No STEMI    Radiology  CXR: IMPRESSION:  No acute disease in patient with bilateral pulmonary nodules from  cervical carcinoma as seen on prior CT.   CT from 12/7 ED visit: IMPRESSION: 1. Mild diffuse wall thickening throughout much of the colon, perhaps most evident at the splenic flexure. This could  represent an infectious or inflammatory colitis. 2. Multiple small cavitary lesions in the bilateral lower lobes, significantly improved from prior study. These are consistent with improving metastatic lesions. 3. Sclerosis involving the parasymphyseal left pubic bone. This is new since April 2020. Findings may represent metastatic disease or a healing subacute fracture. Attention on follow-up examinations is recommended.   Procedures  Procedure(s) performed (including critical care):  Procedures   Initial Impression / Assessment and Plan / ED Course  53 y.o. female who presents to the ED for lower abdominal pain, nausea, vomiting.  Recently diagnosed with colitis on CT on 09/22/19. Started on Augmentin but now unable to tolerate due to symptoms.   Ddx: infectious colitis, chemo related colitis, pancreatitis, gastritis. Suspect her chest discomfort is related to emesis, but consider ACS - will obtain EKG/troponin/XR.  Will obtain labs, imaging, EKG, symptom control and reassess. She is guaiac negative on exam and hemoglobin stable compared to priors. Stool studies ordered.  EKG without acute ischemic changes.  Troponin negative.  WBC count is improving from her prior ED visit.  Unfortunately despite multiple doses of IV pain medication she is still complaining of pain and nausea. Will discuss w/ hospitalist for admission for further symptom management as she is unable to tolerate any of her orals.    Final Clinical Impression(s) / ED Diagnosis  Final diagnoses:  Nausea and vomiting in adult  Lower abdominal pain       Note:  This document was prepared using Dragon voice recognition software and may include unintentional dictation errors.     Lilia Pro., MD 09/25/19 [phone]

## 2019-09-26 ENCOUNTER — Inpatient Hospital Stay: Payer: Medicaid Other | Admitting: Hospice and Palliative Medicine

## 2019-09-26 DIAGNOSIS — C799 Secondary malignant neoplasm of unspecified site: Secondary | ICD-10-CM

## 2019-09-26 DIAGNOSIS — C7800 Secondary malignant neoplasm of unspecified lung: Secondary | ICD-10-CM

## 2019-09-26 DIAGNOSIS — K529 Noninfective gastroenteritis and colitis, unspecified: Secondary | ICD-10-CM

## 2019-09-26 DIAGNOSIS — Z86718 Personal history of other venous thrombosis and embolism: Secondary | ICD-10-CM

## 2019-09-26 DIAGNOSIS — C539 Malignant neoplasm of cervix uteri, unspecified: Secondary | ICD-10-CM

## 2019-09-26 DIAGNOSIS — E876 Hypokalemia: Secondary | ICD-10-CM

## 2019-09-26 DIAGNOSIS — Z66 Do not resuscitate: Secondary | ICD-10-CM

## 2019-09-26 DIAGNOSIS — R197 Diarrhea, unspecified: Secondary | ICD-10-CM

## 2019-09-26 DIAGNOSIS — E2740 Unspecified adrenocortical insufficiency: Secondary | ICD-10-CM

## 2019-09-26 DIAGNOSIS — R112 Nausea with vomiting, unspecified: Secondary | ICD-10-CM

## 2019-09-26 DIAGNOSIS — Z5111 Encounter for antineoplastic chemotherapy: Secondary | ICD-10-CM | POA: Diagnosis not present

## 2019-09-26 DIAGNOSIS — R109 Unspecified abdominal pain: Secondary | ICD-10-CM

## 2019-09-26 DIAGNOSIS — Z515 Encounter for palliative care: Secondary | ICD-10-CM

## 2019-09-26 LAB — APTT
aPTT: 60 seconds — ABNORMAL HIGH (ref 24–36)
aPTT: 67 seconds — ABNORMAL HIGH (ref 24–36)

## 2019-09-26 LAB — BASIC METABOLIC PANEL
Anion gap: 6 (ref 5–15)
BUN: 8 mg/dL (ref 6–20)
CO2: 25 mmol/L (ref 22–32)
Calcium: 8.5 mg/dL — ABNORMAL LOW (ref 8.9–10.3)
Chloride: 101 mmol/L (ref 98–111)
Creatinine, Ser: 0.79 mg/dL (ref 0.44–1.00)
GFR calc Af Amer: >60 mL/min (ref >60)
GFR calc non Af Amer: >60 mL/min (ref >60)
Glucose, Bld: 159 mg/dL — ABNORMAL HIGH (ref 70–99)
Potassium: 3.4 mmol/L — ABNORMAL LOW (ref 3.5–5.1)
Sodium: 132 mmol/L — ABNORMAL LOW (ref 135–145)

## 2019-09-26 LAB — C DIFFICILE QUICK SCREEN W PCR REFLEX
C Diff antigen: NEGATIVE
C Diff interpretation: NOT DETECTED
C Diff toxin: NEGATIVE

## 2019-09-26 LAB — CBC
HCT: 27.6 % — ABNORMAL LOW (ref 36.0–46.0)
Hemoglobin: 9.2 g/dL — ABNORMAL LOW (ref 12.0–15.0)
MCH: 33.8 pg (ref 26.0–34.0)
MCHC: 33.3 g/dL (ref 30.0–36.0)
MCV: 101.5 fL — ABNORMAL HIGH (ref 80.0–100.0)
Platelets: 69 10*3/uL — ABNORMAL LOW (ref 150–400)
RBC: 2.72 MIL/uL — ABNORMAL LOW (ref 3.87–5.11)
RDW: 14.7 % (ref 11.5–15.5)
WBC: 13.4 10*3/uL — ABNORMAL HIGH (ref 4.0–10.5)
nRBC: 0 % (ref 0.0–0.2)

## 2019-09-26 LAB — SARS CORONAVIRUS 2 (TAT 6-24 HRS): SARS Coronavirus 2: NEGATIVE

## 2019-09-26 LAB — MAGNESIUM: Magnesium: 1.5 mg/dL — ABNORMAL LOW (ref 1.7–2.4)

## 2019-09-26 MED ORDER — MAGNESIUM SULFATE 2 GM/50ML IV SOLN
2.0000 g | Freq: Once | INTRAVENOUS | Status: AC
  Administered 2019-09-26: 2 g via INTRAVENOUS
  Filled 2019-09-26: qty 50

## 2019-09-26 MED ORDER — HEPARIN (PORCINE) 25000 UT/250ML-% IV SOLN
1200.0000 [IU]/h | INTRAVENOUS | Status: AC
  Administered 2019-09-26: 1100 [IU]/h via INTRAVENOUS
  Administered 2019-09-27: 1200 [IU]/h via INTRAVENOUS
  Administered 2019-09-27: 1100 [IU]/h via INTRAVENOUS
  Filled 2019-09-26 (×3): qty 250

## 2019-09-26 MED ORDER — CHLORHEXIDINE GLUCONATE CLOTH 2 % EX PADS
6.0000 | MEDICATED_PAD | Freq: Every day | CUTANEOUS | Status: DC
  Administered 2019-09-26 – 2019-09-28 (×3): 6 via TOPICAL

## 2019-09-26 MED ORDER — HEPARIN BOLUS VIA INFUSION
4000.0000 [IU] | Freq: Once | INTRAVENOUS | Status: AC
  Administered 2019-09-26: 4000 [IU] via INTRAVENOUS
  Filled 2019-09-26: qty 4000

## 2019-09-26 NOTE — Consult Note (Signed)
[place] NOTE  Patient Care Team: Delsa Grana, PA-C as PCP - General (Family Medicine) Mellody Drown, MD as Consulting Physician (Obstetrics and Gynecology) [name], MD as Consulting Physician (Gastroenterology) Michael Boston, MD as Consulting Physician (General Surgery) Lovell Sheehan, MD as Consulting Physician (Orthopedic Surgery) Cammie Sickle, MD as Consulting Physician (Oncology) Earnestine Leys, MD as Consulting Physician (Orthopedic Surgery)  CHIEF COMPLAINTS/PURPOSE OF CONSULTATION: Metastatic cervical cancer/  HISTORY OF PRESENTING ILLNESS:  Joanna Hall 53 y.o.  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  Patient has history of chronic abdominal pain nausea vomiting diarrhea-of unclear etiology.  Patient has had history of radiation/surgeries; history of adrenal insufficiency; and has had previous exposure to immunotherapy/Keytruda.  Patient is currently admitted to hospital for worsening similar GI complaints.  Of note patient has had multiple visits to hospital for similar complaints.  Of note patient was recently evaluated in the emergency room for similar complaints when a CT scan showed-colitis.  Patient was discharged home on antibiotics. Over the Patient returned to the hospital again for worsening abdominal pain nausea vomiting and diarrhea.  Patient denies any blood in stools.  Denies any fevers or chills.    Review of Systems  Constitutional: Positive for malaise/fatigue and weight loss. Negative for chills, diaphoresis and fever.  HENT: Negative for nosebleeds and sore throat.   Eyes: Negative for double vision.  Respiratory: Positive for shortness of breath. Negative for cough, hemoptysis, sputum production and wheezing.   Cardiovascular: Negative for chest pain, palpitations, orthopnea and leg swelling.  Gastrointestinal: Positive  for abdominal pain, diarrhea, nausea and vomiting. Negative for blood in stool, constipation, heartburn and melena.  Genitourinary: Negative for dysuria, frequency and urgency.  Musculoskeletal: Negative for back pain and joint pain.  Skin: Negative.  Negative for itching and rash.  Neurological: Positive for weakness. Negative for dizziness, tingling, focal weakness and headaches.  Endo/Heme/Allergies: Does not bruise/bleed easily.  Psychiatric/Behavioral: Negative for depression. The patient is not nervous/anxious and does not have insomnia.      MEDICAL HISTORY:  Past Medical History:  Diagnosis Date  . Abdominal pain 06/10/2018  . Abnormal cervical Papanicolaou smear 09/18/2017  . Anxiety   . Aortic atherosclerosis ([place])   . Arthritis    neck and knees  . 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 Blood transfusion without reported diagnosis   . Cervical cancer ([place]) 09/2016   mets lung  . Chronic anal fissure   . Chronic diarrhea   . Dyspnea   . Erosive gastropathy 09/18/2017  . Factor V Leiden mutation ([place])   . Fecal incontinence   . Genital warts   . GERD (gastroesophageal reflux disease)   . GI bleed 10/08/2018  . Heart murmur   . Hematochezia   . Hemorrhoids   . 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 Hepatitis, chronic ([place]) 05/05/2017  . History of cancer chemotherapy    completed 06/2017  . History of Clostridium difficile infection    while undergoing chemo.  Negative test 10/2017  . Ileocolic anastomotic leak   . 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 Intestinal infection due to Clostridium difficile 09/18/2017  . Macrocytic anemia with vitamin B12 deficiency   . Multiple gastric ulcers   . Nausea vomiting and diarrhea   .  Pancolitis ([place]) 07/27/2018  . Perianal condylomata   . Pneumonia    History of  . Pulmonary nodules   . Rectal bleeding   . Small bowel obstruction ([place]) 08/2017  . Stiff neck     limited right turn  . Vitamin D deficiency     SURGICAL HISTORY: Past Surgical History:  Procedure Laterality Date  . CHOLECYSTECTOMY    . COLON SURGERY  08/2017   resection  . COLONOSCOPY WITH PROPOFOL N/A 12/20/2017   Procedure: COLONOSCOPY WITH PROPOFOL;  Surgeon: [name], MD;  Location: [facility] ENDOSCOPY;  Service: Gastroenterology;  Laterality: N/A;  . COLONOSCOPY WITH PROPOFOL N/A 07/30/2018   Procedure: COLONOSCOPY WITH PROPOFOL;  Surgeon: [name], MD;  Location: [facility] ENDOSCOPY;  Service: Gastroenterology;  Laterality: N/A;  . COLONOSCOPY WITH PROPOFOL N/A 10/10/2018   Procedure: COLONOSCOPY WITH PROPOFOL;  Surgeon: Lucilla Lame, MD;  Location: [facility] ENDOSCOPY;  Service: Endoscopy;  Laterality: N/A;  . DIAGNOSTIC LAPAROSCOPY    . ESOPHAGOGASTRODUODENOSCOPY (EGD) WITH PROPOFOL N/A 12/20/2017   Procedure: ESOPHAGOGASTRODUODENOSCOPY (EGD) WITH PROPOFOL;  Surgeon: [name], MD;  Location: [facility];  Service: Gastroenterology;  Laterality: N/A;  . ESOPHAGOGASTRODUODENOSCOPY (EGD) WITH PROPOFOL  07/30/2018   Procedure: ESOPHAGOGASTRODUODENOSCOPY (EGD) WITH PROPOFOL;  Surgeon: [name], MD;  Location: [facility] ENDOSCOPY;  Service: Gastroenterology;;  . Otho Darner SIGMOIDOSCOPY N/A 11/21/2018   Procedure: FLEXIBLE SIGMOIDOSCOPY;  Surgeon: [name], MD;  Location: [facility] ENDOSCOPY;  Service: Gastroenterology;  Laterality: N/A;  . LAPAROTOMY N/A 08/31/2017   Procedure: EXPLORATORY LAPAROTOMY for SBO, ileocolectomy, removal of piece of uterine wall;  Surgeon: [name], MD;  Location: [facility] ORS;  Service: General;  Laterality: N/A;  . LASER ABLATION CONDOLAMATA N/A 02/22/2018   Procedure: LASER ABLATION/REMOVAL OF [id number] AROUND ANUS AND VAGINA;  Surgeon: Michael Boston, MD;  Location: [facility];  Service: General;  Laterality: N/A;  . OOPHORECTOMY    . PORTA CATH INSERTION N/A 05/13/2018   Procedure: PORTA CATH INSERTION;   Surgeon: Katha Cabal, MD;  Location: [facility] CV LAB;  Service: Cardiovascular;  Laterality: N/A;  . SMALL INTESTINE SURGERY    . TANDEM RING INSERTION     x3  . THORACOTOMY    . TOTAL KNEE ARTHROPLASTY Left 04/24/2018   Procedure: TOTAL KNEE ARTHROPLASTY;  Surgeon: Lovell Sheehan, MD;  Location: [facility] ORS;  Service: Orthopedics;  Laterality: Left;    SOCIAL HISTORY: Social History   Socioeconomic History  . Marital status: Divorced    Spouse name: Not on file  . Number of children: Not on file  . Years of education: Not on file  . Highest education level: Not on file  Occupational History  . Not on file  Tobacco Use  . Smoking status: Former Smoker    Packs/day: 0.25    Years: 10.00    Pack years: 2.50    Types: Cigarettes    Quit date: 10/16/2006    Years since quitting: 12.9  . Smokeless tobacco: Never Used  Substance and Sexual Activity  . Alcohol use: Not Currently    Comment: seldom  . Drug use: Yes    Types: Marijuana    Comment: not very often  . Sexual activity: Not Currently    Birth control/protection: Post-menopausal    Comment: Not Asked  Other Topics Concern  . Not on file  Social History Narrative  . Not on file   Social Determinants of Health   Financial Resource Strain:   . Difficulty  of Paying Living Expenses: Not on file  Food Insecurity:   . Worried About Charity fundraiser in the Last Year: Not on file  . Ran Out of Food in the Last Year: Not on file  Transportation Needs:   . Lack of Transportation (Medical): Not on file  . Lack of Transportation (Non-Medical): Not on file  Physical Activity:   . Days of Exercise per Week: Not on file  . Minutes of Exercise per Session: Not on file  Stress:   . Feeling of Stress : Not on file  Social Connections:   . Frequency of Communication with Friends and Family: Not on file  . Frequency of Social Gatherings with Friends and Family: Not on file  . Attends Religious Services: Not on file   . Active Member of Clubs or Organizations: Not on file  . Attends Archivist Meetings: Not on file  . Marital Status: Not on file  Intimate Partner Violence:   . Fear of Current or Ex-Partner: Not on file  . Emotionally Abused: Not on file  . Physically Abused: Not on file  . Sexually Abused: Not on file    FAMILY HISTORY: Family History  Problem Relation Age of Onset  . Hypertension Mother   . Hypertension Father   . Diabetes Father   . Hyperlipidemia Father   . Alcohol abuse Daughter   . Hypertension Maternal Grandmother   . Diabetes Maternal Grandmother   . Diabetes Paternal Grandmother   . Hypertension Paternal Grandmother     ALLERGIES:  is allergic to ketamine.  MEDICATIONS:  Current Facility-Administered Medications  Medication Dose Route Frequency Provider Last Rate Last Admin  . 0.9 % NaCl with KCl 20 mEq/ L  infusion   Intravenous Continuous Loletha Grayer, MD 50 mL/hr at 09/26/19 2013 New Bag at 09/26/19 2013  . acetaminophen (TYLENOL) tablet 650 mg  650 mg Oral Q6H PRN Loletha Grayer, MD       Or  . acetaminophen (TYLENOL) suppository 650 mg  650 mg Rectal Q6H PRN Wieting, Richard, MD      . albuterol (PROVENTIL) (2.5 MG/3ML) 0.083% nebulizer solution 2.5 mg  2.5 mg Inhalation Q6H PRN Wieting, Richard, MD      . amitriptyline (ELAVIL) tablet 75 mg  75 mg Oral QHS Loletha Grayer, MD   75 mg at 09/26/19 2104  . Chlorhexidine Gluconate Cloth 2 % PADS 6 each  6 each Topical Daily Loletha Grayer, MD   6 each at 09/26/19 1108  . ciprofloxacin (CIPRO) IVPB 400 mg  400 mg Intravenous Q12H Loletha Grayer, MD 200 mL/hr at 09/26/19 1737 400 mg at 09/26/19 1737  . diphenoxylate-atropine (LOMOTIL) 2.5-0.025 MG per tablet 2 tablet  2 tablet Oral BID PRN Loletha Grayer, MD   2 tablet at 09/26/19 2013  . folic acid (FOLVITE) tablet 1 mg  1 mg Oral Daily Wieting, Richard, MD   1 mg at 09/26/19 0848  . heparin ADULT infusion 100 units/mL (25000 units/263m  sodium chloride 0.45%)  1,100 Units/hr Intravenous Continuous HHart RobinsonsA, RPH 11 mL/hr at 09/26/19 1400 1,100 Units/hr at 09/26/19 1400  . hyoscyamine (LEVBID) 0.375 MG 12 hr tablet 0.375 mg  0.375 mg Oral BID PRN WLoletha Grayer MD      . metroNIDAZOLE (FLAGYL) IVPB 500 mg  500 mg Intravenous Q8H WLoletha Grayer MD 100 mL/hr at 09/26/19 2108 500 mg at 09/26/19 2108  . morphine 2 MG/ML injection 2 mg  2 mg Intravenous Q4H PRN Wieting,  Richard, MD   2 mg at 09/26/19 1328  . ondansetron (ZOFRAN) injection 4 mg  4 mg Intravenous Q6H Loletha Grayer, MD   4 mg at 09/26/19 1727  . oxyCODONE (Oxy IR/ROXICODONE) immediate release tablet 10-20 mg  10-20 mg Oral Q6H PRN Loletha Grayer, MD   10 mg at 09/26/19 2011  . pantoprazole (PROTONIX) injection 40 mg  40 mg Intravenous Q12H Loletha Grayer, MD   40 mg at 09/26/19 2105  . promethazine (PHENERGAN) tablet 12.5 mg  12.5 mg Oral Q6H PRN Loletha Grayer, MD   12.5 mg at 09/26/19 1327  . sertraline (ZOLOFT) tablet 25 mg  25 mg Oral Daily Loletha Grayer, MD   25 mg at 09/26/19 0848   Facility-Administered Medications Ordered in Other Encounters  Medication Dose Route Frequency Provider Last Rate Last Admin  . heparin lock flush 100 unit/mL  500 Units Intravenous Once Corcoran, Melissa C, MD      . sodium chloride 0.9 % 1,000 mL with potassium chloride 20 mEq infusion   Intravenous Once Honor Loh E, NP      . sodium chloride flush (NS) 0.9 % injection 10 mL  10 mL Intravenous Once Borders, Kirt Boys, NP         PHYSICAL EXAMINATION:  Vitals:   09/26/19 0442 09/26/19 1831  BP: 119/78 131/88  Pulse: 77 73  Resp: 18 18  Temp: (!) 97.4 F (36.3 C) 98.5 F (36.9 C)  SpO2: 100% 100%   Filed Weights   09/25/19 0810  Weight: 164 lb (74.4 kg)    Physical Exam  Constitutional: She is oriented to person, place, and time and well-developed, well-nourished, and in no distress.  HENT:  Head: Normocephalic and atraumatic.  Mouth/Throat:  Oropharynx is clear and moist. No oropharyngeal exudate.  Eyes: Pupils are equal, round, and reactive to light.  Cardiovascular: Normal rate and regular rhythm.  Pulmonary/Chest: Effort normal and breath sounds normal. No respiratory distress. She has no wheezes.  Abdominal: Soft. Bowel sounds are normal. She exhibits no distension and no mass. There is no abdominal tenderness. There is no rebound and no guarding.  Mild abdominal pain no significant tenderness.  Musculoskeletal:        General: No tenderness or edema. Normal range of motion.     Cervical back: Normal range of motion and neck supple.  Neurological: She is alert and oriented to person, place, and time.  Skin: Skin is warm.  Psychiatric: Affect normal.     LABORATORY DATA:  I have reviewed the data as listed Lab Results  Component Value Date   WBC 13.4 (H) 09/26/2019   HGB 9.2 (L) 09/26/2019   HCT 27.6 (L) 09/26/2019   MCV 101.5 (H) 09/26/2019   PLT 69 (L) 09/26/2019   Recent Labs    10/21/18 0845 09/18/19 0827 09/22/19 1638 09/25/19 0810 09/25/19 1338 09/26/19 0556  NA 134* 135 133* 134*  --  132*  K 3.9 3.8 3.7 3.6  --  3.4*  CL 98 104 97* 101  --  101  CO2 24 23 21* 22  --  25  GLUCOSE 111* 128* 156* 108*  --  159*  BUN 17 13 18 14   --  8  CREATININE 0.91 0.68 0.59 0.69  --  0.79  CALCIUM 9.6 8.9 9.7 9.3  --  8.5*  GFRNONAA >60 >60 >60 >60  --  >60  GFRAA >60 >60 >60 >60  --  >60  PROT 8.6* 7.2 8.3*  --  7.4  --   ALBUMIN 4.4 3.2* 4.1  --  3.5  --   AST 48* 53* 119*  --  83*  --   ALT 67* 67* 191*  --  134*  --   ALKPHOS 185* 167* 213*  --  180*  --   BILITOT 1.4* 0.5 1.1  --  0.6  --   BILIDIR 0.3*  --   --   --  0.2  --   IBILI  --   --   --   --  0.4  --     RADIOGRAPHIC STUDIES: I have personally reviewed the radiological images as listed and agreed with the findings in the report. DG Chest 2 View  Result Date: 09/25/2019 CLINICAL DATA:  History of metastatic cervical cancer. Chest pain,  diarrhea, nausea and blood in stool. EXAM: CHEST - 2 VIEW COMPARISON:  CT chest 08/01/2019. Single-view of the chest 01/26/2019 FINDINGS: Scattered pulmonary nodules are seen but are better visualized on the prior CT scan. Lungs are otherwise clear without consolidative process or edema. No pneumothorax or pleural effusion. Heart size is normal. No acute or focal bony abnormality. Port-A-Cath is in place. IMPRESSION: No acute disease in patient with bilateral pulmonary nodules from cervical carcinoma as seen on prior CT. Electronically Signed   By: Inge Rise M.D.   On: 09/25/2019 08:44   CT ABDOMEN PELVIS W CONTRAST  Result Date: 09/22/2019 CLINICAL DATA:  Acute abdominal pain EXAM: CT ABDOMEN AND PELVIS WITH CONTRAST TECHNIQUE: Multidetector CT imaging of the abdomen and pelvis was performed using the standard protocol following bolus administration of intravenous contrast. CONTRAST:  164m OMNIPAQUE IOHEXOL 300 MG/ML  SOLN COMPARISON:  08/13/2019 FINDINGS: Lower chest: Again noted are multiple small cavitary lesions in the bilateral lower lobes, significantly improved from prior study.The heart size is normal. Hepatobiliary: The liver is normal. Status post cholecystectomy.There is intrahepatic and extrahepatic biliary ductal dilatation which is similar to prior study. Pancreas: Normal contours without ductal dilatation. No peripancreatic fluid collection. Spleen: No splenic laceration or hematoma. Adrenals/Urinary Tract: --Adrenal glands: No adrenal hemorrhage. --Right kidney/ureter: No hydronephrosis or perinephric hematoma. --Left kidney/ureter: No hydronephrosis or perinephric hematoma. --Urinary bladder: The urinary bladder is decompressed which limits evaluation. Stomach/Bowel: --Stomach/Duodenum: No hiatal hernia or other gastric abnormality. Normal duodenal course and caliber. --Small bowel: No dilatation or inflammation. --Colon: There is mild diffuse wall thickening throughout much of the  colon, perhaps most evident at the splenic flexure. --Appendix: Surgically absent. Vascular/Lymphatic: Atherosclerotic calcification is present within the non-aneurysmal abdominal aorta, without hemodynamically significant stenosis. --No retroperitoneal lymphadenopathy. --No mesenteric lymphadenopathy. --No pelvic or inguinal lymphadenopathy. Reproductive: Unremarkable Other: No ascites or free air. The abdominal wall is normal. Musculoskeletal. There is stable asymmetric sclerosis involving the parasymphyseal left pubic bone. While this is stable from prior study, it is new from 01/23/2019. IMPRESSION: 1. Mild diffuse wall thickening throughout much of the colon, perhaps most evident at the splenic flexure. This could represent an infectious or inflammatory colitis. 2. Multiple small cavitary lesions in the bilateral lower lobes, significantly improved from prior study. These are consistent with improving metastatic lesions. 3. Sclerosis involving the parasymphyseal left pubic bone. This is new since April 2020. Findings may represent metastatic disease or a healing subacute fracture. Attention on follow-up examinations is recommended. Aortic Atherosclerosis (ICD10-I70.0). Electronically Signed   By: CConstance HolsterM.D.   On: 09/22/2019 19:29   DG Abd Acute W/Chest  Result Date: 09/16/2019 CLINICAL DATA:  Shortness of breath  and abdominal pain. History of cervical carcinoma EXAM: DG ABDOMEN ACUTE W/ 1V CHEST COMPARISON:  Chest CT August 01, 2019; abdominal radiographs July 23, 2019; CT abdomen and pelvis July 22, 2019 FINDINGS: PA chest: The cavitary nodular lesions seen on chest CT approximately 7 weeks prior are not appreciable by radiography. There is mild chronic blunting of the left costophrenic angle. No edema or consolidation evident by radiography. Heart size and pulmonary vascularity are normal. No adenopathy. Port-A-Cath tip is in the superior vena cava. Supine and upright abdomen: There is  no bowel dilatation or air-fluid level to suggest bowel obstruction. No free air. Surgical clips are noted in the gallbladder fossa region. No blastic or lytic bone lesions. There is degenerative change in the lumbar spine with scoliosis. There are apparent phleboliths in the pelvis. IMPRESSION: No bowel obstruction or free air demonstrable. Surgical clips in gallbladder fossa region. No edema or consolidation. The nodular lesions seen on recent CT are not appreciable by radiography. Port-A-Cath tip in superior vena cava. Cardiac silhouette within normal limits. Electronically Signed   By: Lowella Grip III M.D.   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  Recent CT scan shows-evidence of colitis  #Cervical with metastasis to the lung-  Currently on Alimta-status post # 3 cycles; CT abdomen pelvis most recent shows response to therapy.  Patient is currently awaiting CT scan of the chest in the next 2 weeks or so.  #Abdominal pain nausea vomiting diarrhea-CT scan shows evidence of colitis; etiology is unclear-prior history of radiation/bowel resections; history of immune insufficiency; question related to immune related side effects from [facility].  Current chemotherapy Alimta is unlikely to cause severe diarrhea.  Appreciate GI evaluation.  # Adrenal insufficiency-continue hydrocortisone.  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   #Overall prognosis: DNR/DNI.  Unfortunately is poor.  patient is aware that she has incurable disease.  Followed by palliative care.  Thank you Dr.Weiting for allowing me to participate in the care of your pleasant patient. Please do not hesitate to contact me with questions or concerns in the interim.  Dr.Yu on call over the weekend for any  consultations.    All questions were answered. The patient knows to call the clinic with any problems, questions or concerns.    Cammie Sickle, MD 09/26/2019 10:24 PM

## 2019-09-26 NOTE — Progress Notes (Signed)
[place] for heparin Indication: Factor V deficiency/clots  Allergies  Allergen Reactions  . Ketamine Anxiety and Other (See Comments)    Syncope episode/confusion     Patient Measurements: Height: 5' 8"  (172.7 cm) Weight: 164 lb (74.4 kg) IBW/kg (Calculated) : 63.9 Heparin Dosing Weight: 74 kg  Vital Signs: Temp: 97.4 F (36.3 C) (12/11 0442) Temp Source: Oral (12/11 0442) BP: 119/78 (12/11 0442) Pulse Rate: 77 (12/11 0442)  Labs: Recent Labs    09/25/19 0810 09/25/19 1339 09/25/19 1550 09/26/19 0556 09/26/19 1546  HGB 11.5*  --   --  9.2*  --   HCT 33.4*  --   --  27.6*  --   PLT 89*  --   --  69*  --   APTT  --   --  26  --  60*  LABPROT 13.3  --   --   --   --   INR 1.0  --   --   --   --   HEPARINUNFRC  --   --  0.30  --   --   CREATININE 0.69  --   --  0.79  --   TROPONINIHS 13 8  --   --   --     Estimated Creatinine Clearance: 83 mL/min (by C-G formula based on SCr of 0.79 mg/dL).   Medical History: Past Medical History:  Diagnosis Date  . Abdominal pain 06/10/2018  . Abnormal cervical Papanicolaou smear 09/18/2017  . Anxiety   . Aortic atherosclerosis ([place])   . Arthritis    neck and knees  . 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 Blood transfusion without reported diagnosis   . Cervical cancer ([place]) 09/2016   mets lung  . Chronic anal fissure   . Chronic diarrhea   . Dyspnea   . Erosive gastropathy 09/18/2017  . Factor V Leiden mutation ([place])   . Fecal incontinence   . Genital warts   . GERD (gastroesophageal reflux disease)   . GI bleed 10/08/2018  . Heart murmur   . Hematochezia   . Hemorrhoids   . 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 Hepatitis, chronic ([place]) 05/05/2017  . History of cancer chemotherapy    completed 06/2017  . History of Clostridium difficile infection    while undergoing chemo.  Negative test 10/2017  . Ileocolic anastomotic leak    . 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 Intestinal infection due to Clostridium difficile 09/18/2017  . Macrocytic anemia with vitamin B12 deficiency   . Multiple gastric ulcers   . Nausea vomiting and diarrhea   . Pancolitis ([place]) 07/27/2018  . Perianal condylomata   . Pneumonia    History of  . Pulmonary nodules   . Rectal bleeding   . Small bowel obstruction ([place]) 08/2017  . Stiff neck    limited right turn  . 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 Patient takes 20 mg daily. Pharmacy consulted for heparin drip. Xarelto on hold. Last dose of Xarelto reported 12/10 am.  Ordered heparin 4000 unit bolus followed by heparin drip at 1100 units/hr to start 12/11 at 0930 (approximately 24 hours since last Xarelto dose)  1211 1546 APTT 60 seconds (subtherapeutic)  Goal of Therapy:  Heparin level  0.3-0.7 units/ml aPTT 66-102 seconds Monitor platelets by anticoagulation protocol: Yes   Plan:  Will increase heparin drip to 1250 units/hour  Will reassess APTT in 6 hours per protocol  Will follow APTT until correlation with HL.   CBC/HL daily while on heparin drip.   Lu Duffel, PharmD, BCPS Clinical Pharmacist 09/26/2019 4:33 PM

## 2019-09-26 NOTE — TOC Initial Note (Signed)
Transition of Care [facility]) - Initial/Assessment Note    Patient Details  Name: Joanna Hall MRN: [id number] Date of Birth: Apr 18, 1966  Transition [facility]) CM/SW Contact:    Shade Flood, LCSW Phone Number: 09/26/2019, 3:51 PM  Clinical Narrative:                  Pt admitted from home. She is high risk for readmission. Pt has had several recent ED visits. She is under treatment for metastatic cancer. Pt is independent in ADLs at home. She has insurance and is established with care.  TOC will follow and assist with dc planning as needed.  Expected Discharge Plan: Home/Self Care Barriers to Discharge: Continued Medical Work up   Patient Goals and CMS Choice        Expected Discharge Plan and Services Expected Discharge Plan: Home/Self Care     Post Acute Care Choice: Resumption of Svcs/PTA Provider Living arrangements for the past 2 months: Single Family Home                                      Prior Living Arrangements/Services Living arrangements for the past 2 months: Single Family Home Lives with:: Self Patient language and need for interpreter reviewed:: Yes Do you feel safe going back to the place where you live?: Yes      Need for Family Participation in Patient Care: No (Comment) Care giver support system in place?: Yes (comment)   Criminal Activity/Legal Involvement Pertinent to Current Situation/Hospitalization: No - Comment as needed  Activities of Daily Living Home Assistive Devices/Equipment: None ADL Screening (condition at time of admission) Patient's cognitive ability adequate to safely complete daily activities?: Yes Is the patient deaf or have difficulty hearing?: No Does the patient have difficulty seeing, even when wearing glasses/contacts?: No Does the patient have difficulty concentrating, remembering, or making decisions?: No Patient able to express need for assistance with ADLs?: Yes Does the patient have difficulty  dressing or bathing?: No Independently performs ADLs?: Yes (appropriate for developmental age) Does the patient have difficulty walking or climbing stairs?: Yes Weakness of Legs: Both Weakness of Arms/Hands: Both  Permission Sought/Granted                  Emotional Assessment Appearance:: Appears stated age     Orientation: : Oriented to Self, Oriented to Place, Oriented to  Time, Oriented to Situation Alcohol / Substance Use: Not Applicable Psych Involvement: No (comment)  Admission diagnosis:  Colitis [K52.9] Lower abdominal pain [R10.30] Nausea and vomiting in adult [R11.2] Patient Active Problem List   Diagnosis Date Noted  . Acute colitis 09/25/2019  . Anxiety and depression   . Thrombocytopenia ([place]) 08/17/2019  . Intractable lower abdominal pain   . Abdominal pain 07/23/2019  . Constipation 07/22/2019  . Malnutrition of moderate degree 06/22/2019  . Small bowel obstruction due to adhesions ([place]) 06/21/2019  . Acute gastroenteritis 06/14/2019  . Intractable nausea and vomiting 06/07/2019  . Chest pain 05/19/2019  . Intractable pain 05/19/2019  . Cancer associated pain   . Proctitis, radiation   . Palliative care encounter   . Encounter for monitoring opioid maintenance therapy 11/19/2018  . Adrenal insufficiency ([place]) 09/03/2018  . Nausea vomiting and diarrhea   . Chronic diarrhea 06/25/2018  . Chronic anticoagulation 06/25/2018  . Vomiting   . Encounter for antineoplastic chemotherapy 06/04/2018  . 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  Lung metastasis ([place]) 05/15/2018  . Closed fracture of distal end of radius 05/04/2018  . History of total knee arthroplasty 04/24/2018  . Osteoarthritis of left knee 02/28/2018  . Condyloma acuminatum of anus s/p ablation 02/22/2018 02/22/2018  . Condyloma acuminatum of vagina s/p ablation 02/22/2018 02/22/2018  . Positive ANA (antinuclear antibody) 02/04/2018  . Aortic atherosclerosis ([place]) 01/15/2018  . Elevated MCV  01/15/2018  . Anemia 01/15/2018  . Genital warts 01/15/2018  . Vitamin D deficiency 01/15/2018  . Pernicious anemia   . Impingement syndrome of shoulder region 12/19/2017  . Multiple joint pain 12/19/2017  . Osteoarthritis of right knee 12/19/2017  . B12 deficiency 12/11/2017  . Lung nodules 12/11/2017  . History of Clostridium difficile infection 10/23/2017  . Factor V Leiden ([place]) 10/19/2017  . Goals of care, counseling/discussion 10/19/2017  . Cervical cancer, FIGO stage IB1 ([place]) 09/18/2017  . Chronic hepatitis C without hepatic coma ([place]) 09/18/2017  . Cytopenia 09/18/2017  . Diarrhea 09/18/2017  . Lower abdominal pain 09/18/2017  . 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 Malignant neoplasm of overlapping sites of cervix ([place]) 09/18/2017  . Renal insufficiency 09/18/2017  . Wound infection after surgery 09/12/2017  . Hypokalemia   . Hypomagnesemia   . Cervical arthritis 07/18/2017  . Dysuria 06/20/2017  . Metastasis from cervical cancer ([place]) 05/12/2017  . Essential hypertension 03/15/2017  . Anemia in other chronic diseases classified elsewhere 03/01/2017  . Chemotherapy-induced neutropenia ([place]) 01/29/2017  . 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[place]) 09/25/2016   PCP:  Delsa Grana, PA-C Pharmacy:   [facility], [place] [address] Phone: [phone] Fax: [phone]  [facility] [address] [place] [address] Phone: [phone] Fax: [phone]     Social Determinants of Health (SDOH) Interventions    Readmission Risk Interventions Readmission Risk Prevention Plan 09/26/2019 07/24/2019 06/25/2019  Transportation Screening Complete Complete Complete  Medication Review (RN Care Manager) Complete Referral to Pharmacy Complete  PCP or Specialist appointment within 3-5 days of discharge - Complete (No Data)  [place] or [place] - Complete -  SW Recovery Care/Counseling Consult - Complete -  [place] Not Applicable Not Applicable Not Applicable  Some recent data might be hidden

## 2019-09-26 NOTE — Progress Notes (Signed)
Patient ID: Joanna Hall, female   DOB: [birth year], 53 y.o.   MRN: [id number] [facility] PROGRESS NOTE  Joanna Hall YHC:[id number] DOB: 04/05/1966 DOA: 09/25/2019 PCP: Delsa Grana, PA-C  HPI/Subjective: Patient feeling a little bit better.  Still having lower abdominal pain cramping type pain.  Still having a lot of diarrhea.  Some nausea but no vomiting since yesterday.  Wanted to start on liquids today.  Objective: Vitals:   09/25/19 2101 09/26/19 0442  BP: 130/72 119/78  Pulse: 86 77  Resp: 18 18  Temp: 97.7 F (36.5 C) (!) 97.4 F (36.3 C)  SpO2: 100% 100%    Intake/Output Summary (Last 24 hours) at 09/26/2019 1424 Last data filed at 09/26/2019 1400 Gross per 24 hour  Intake 2275.51 ml  Output --  Net 2275.51 ml   Filed Weights   09/25/19 0810  Weight: 74.4 kg    ROS: Review of Systems  Constitutional: Negative for chills and fever.  Eyes: Negative for blurred vision.  Respiratory: Negative for cough and shortness of breath.   Cardiovascular: Negative for chest pain.  Gastrointestinal: Positive for abdominal pain, diarrhea and nausea. Negative for constipation and vomiting.  Genitourinary: Negative for dysuria.  Musculoskeletal: Negative for joint pain.  Neurological: Negative for dizziness and headaches.   Exam: Physical Exam  Constitutional: She is oriented to person, place, and time.  HENT:  Nose: No mucosal edema.  Mouth/Throat: No oropharyngeal exudate or posterior oropharyngeal edema.  Eyes: Pupils are equal, round, and reactive to light. Conjunctivae, EOM and lids are normal.  Neck: No JVD present. Carotid bruit is not present. No thyroid mass and no thyromegaly present.  Cardiovascular: S1 normal and S2 normal. Exam reveals no gallop.  No murmur heard. Pulses:      Dorsalis pedis pulses are 2+ on the right side and 2+ on the left side.  Respiratory: No respiratory distress. She has decreased breath sounds in the right lower field  and the left lower field. She has no wheezes. She has no rhonchi. She has no rales.  GI: Soft. Bowel sounds are normal. There is generalized abdominal tenderness.  Musculoskeletal:     Cervical back: No edema.     Right ankle: No swelling.     Left ankle: No swelling.  Lymphadenopathy:    She has no cervical adenopathy.  Neurological: She is alert and oriented to person, place, and time. No cranial nerve deficit.  Skin: Skin is warm. No rash noted. Nails show no clubbing.  Psychiatric: She has a normal mood and affect.      Data Reviewed: Basic Metabolic Panel: Recent Labs  Lab 09/22/19 1638 09/25/19 0810 09/26/19 0556  NA 133* 134* 132*  K 3.7 3.6 3.4*  CL 97* 101 101  CO2 21* 22 25  GLUCOSE 156* 108* 159*  BUN 18 14 8   CREATININE 0.59 0.69 0.79  CALCIUM 9.7 9.3 8.5*  MG  --   --  1.5*   Liver Function Tests: Recent Labs  Lab 09/22/19 1638 09/25/19 1338  AST 119* 83*  ALT 191* 134*  ALKPHOS 213* 180*  BILITOT 1.1 0.6  PROT 8.3* 7.4  ALBUMIN 4.1 3.5   Recent Labs  Lab 09/22/19 1638 09/25/19 1339  LIPASE 28 23   CBC: Recent Labs  Lab 09/22/19 1638 09/25/19 0810 09/26/19 0556  WBC 16.3* 9.1 13.4*  NEUTROABS 14.0*  --   --   HGB 13.6 11.5* 9.2*  HCT 39.7 33.4* 27.6*  MCV 97.5 97.4 101.5*  PLT 215 89* 69*   BNP (last 3 results) Recent Labs    05/23/19 1219  BNP 23.0      Recent Results (from the past 240 hour(s))  SARS CORONAVIRUS 2 (TAT 6-24 HRS) Nasopharyngeal Nasopharyngeal Swab     Status: None   Collection Time: 09/25/19  3:26 PM   Specimen: Nasopharyngeal Swab  Result Value Ref Range Status   SARS Coronavirus 2 NEGATIVE NEGATIVE Final    Comment: (NOTE) SARS-CoV-2 target nucleic acids are NOT DETECTED. The SARS-CoV-2 RNA is generally detectable in upper and lower respiratory specimens during the acute phase of infection. Negative results do not preclude SARS-CoV-2 infection, do not rule out co-infections with other pathogens, and  should not be used as the sole basis for treatment or other patient management decisions. Negative results must be combined with clinical observations, patient history, and epidemiological information. The expected result is Negative. Fact Sheet for Patients: SugarRoll.be Fact Sheet for Healthcare Providers: https://www.woods-mathews.com/ This test is not yet approved or cleared by the [place] FDA and  has been authorized for detection and/or diagnosis of SARS-CoV-2 by FDA under an Emergency Use Authorization (EUA). This EUA will remain  in effect (meaning this test can be used) for the duration of the COVID-19 declaration under Section 56 4(b)(1) of the Act, 21 U.S.C. section 360bbb-3(b)(1), unless the authorization is terminated or revoked sooner. Performed at [facility], [place] [address], [place], [place] [address]   C difficile quick scan w PCR reflex     Status: None   Collection Time: 09/26/19 12:46 AM   Specimen: STOOL  Result Value Ref Range Status   C Diff antigen NEGATIVE NEGATIVE Final   C Diff toxin NEGATIVE NEGATIVE Final   C Diff interpretation No C. difficile detected.  Final    Comment: Performed at [facility], [address] [place], [place] [address]     Studies: DG Chest 2 View  Result Date: 09/25/2019 CLINICAL DATA:  History of metastatic cervical cancer. Chest pain, diarrhea, nausea and blood in stool. EXAM: CHEST - 2 VIEW COMPARISON:  CT chest 08/01/2019. Single-view of the chest 01/26/2019 FINDINGS: Scattered pulmonary nodules are seen but are better visualized on the prior CT scan. Lungs are otherwise clear without consolidative process or edema. No pneumothorax or pleural effusion. Heart size is normal. No acute or focal bony abnormality. Port-A-Cath is in place. IMPRESSION: No acute disease in patient with bilateral pulmonary nodules from cervical carcinoma as seen on prior CT. Electronically  Signed   By: Inge Rise M.D.   On: 09/25/2019 08:44    Scheduled Meds: . amitriptyline  75 mg Oral QHS  . Chlorhexidine Gluconate Cloth  6 each Topical Daily  . folic acid  1 mg Oral Daily  . ondansetron (ZOFRAN) IV  4 mg Intravenous Q6H  . pantoprazole (PROTONIX) IV  40 mg Intravenous Q12H  . sertraline  25 mg Oral Daily   Continuous Infusions: . 0.9 % NaCl with KCl 20 mEq / L 50 mL/hr at 09/26/19 1400  . ciprofloxacin Stopped (09/26/19 0533)  . heparin 1,100 Units/hr (09/26/19 1400)  . metronidazole Stopped (09/26/19 0701)    Assessment/Plan:  1. Acute colitis with nausea vomiting diarrhea and abdominal pain.  Likely due to chemotherapy and/or prior radiation.  Stool for C. difficile negative.  Stool comprehensive panel still pending.  Empiric antibiotics. Appreciate GI consultation.  Standing dose Zofran.  IV fluid hydration with potassium.  Advance to clear liquid diet. 2.  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  Thrombocytopenia secondary to recent chemotherapy.  Anemia secondary to chemotherapy. 3. Elevated liver function tests and history of hepatitis C in the past 4. Anxiety depression on Zoloft and Elavil 5. Chest pain likely secondary to persistent vomiting 6. 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  Patient is on heparin just in case GI wants to do procedure.  We will switch back to Xarelto closer to the time of discharge.  Code Status:     Code Status Orders  (From admission, onward)         Start     Ordered   09/25/19 1501  Do not attempt resuscitation (DNR)  Continuous    Question Answer Comment  In the event of cardiac or respiratory ARREST Do not call a "code blue"   In the event of cardiac or respiratory ARREST Do not perform Intubation, CPR, defibrillation or ACLS   In the event of cardiac or respiratory ARREST Use medication by any route, position, wound care, and other measures to relive pain and suffering. May use oxygen, suction and  manual treatment of airway obstruction as needed for comfort.   Comments Nurse may pronounce      09/25/19 1501        Code Status History    Date Active Date Inactive Code Status Order ID Comments User Context   08/14/2019 1033 08/19/2019 1510 DNR [id number]  Irean Hong, NP Inpatient   08/13/2019 1443 08/14/2019 1033 Full Code [id number]  Demetrios Loll, MD ED   07/22/2019 1658 07/24/2019 1436 Full Code [id number]  Henreitta Leber, MD Inpatient   06/21/2019 1305 06/25/2019 2037 Full Code [id number]  Lang Snow, NP ED   06/14/2019 0418 06/15/2019 1902 Full Code [id number]  Mansy, Arvella Merles, MD ED   06/07/2019 1536 06/10/2019 2044 Full Code [id number]  Mayo, Pete Pelt, MD Inpatient   05/19/2019 0313 05/22/2019 1545 Full Code [id number]  Mayer Camel, NP Inpatient   11/19/2018 1803 11/26/2018 2238 Full Code [id number]  Nicholes Mango, MD Inpatient   10/17/2018 1910 10/18/2018 2119 Full Code [id number]  Hillary Bow, MD Inpatient   10/08/2018 1443 10/11/2018 1920 Full Code [id number]  Nicholes Mango, MD Inpatient   07/27/2018 1621 08/01/2018 1432 Full Code [id number]  Demetrios Loll, MD Inpatient   06/10/2018 1754 06/12/2018 1614 Full Code [id number]  Dustin Flock, MD Inpatient   04/24/2018 1451 04/27/2018 1700 Full Code [id number]  Lovell Sheehan, MD Inpatient   12/03/2017 1619 12/08/2017 1606 Full Code [id number]  Epifanio Lesches, MD ED   09/12/2017 0024 09/15/2017 1721 Full Code [id number]  [name], MD ED   08/27/2017 0223 09/05/2017 1355 Full Code [id number]  Herbert Pun, MD Inpatient   Advance Care Planning Activity    Advance Directive Documentation     Most Recent Value  Type of Advance Directive  Out of facility DNR (pink MOST or yellow form)  Pre-existing out of facility DNR order (yellow form or pink MOST form)  --  "MOST" Form in Place?  --     Disposition Plan: Diarrhea will need to slow down prior to disposition  Consultants:  Gastroenterology  Hematology  Time spent: 27  minutes  [place]

## 2019-09-26 NOTE — Consult Note (Signed)
[place]  Telephone:([phone] [phone]   Name: Joanna Hall Date: 09/26/2019 MRN: [id number]  DOB: 08-18-1966  Patient Care Team: Delsa Grana, PA-C as PCP - General (Family Medicine) Mellody Drown, MD as Consulting Physician (Obstetrics and Gynecology) [name], MD as Consulting Physician (Gastroenterology) Michael Boston, MD as Consulting Physician (General Surgery) Lovell Sheehan, MD as Consulting Physician (Orthopedic Surgery) Cammie Sickle, MD as Consulting Physician (Oncology) Earnestine Leys, MD as Consulting Physician (Orthopedic Surgery)    [place]: Palliative Care consult requested for [phone] y.o.femalewith multiple medical problems including 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 Patient was recently hospitalized 07/22/2019-07/24/2019 with abdominal pain and constipation.  She was hospitalized again 08/13/2019-08/19/2019 with intractable abdominal pain.  She is now readmitted 09/25/2019 for abdominal pain and diarrhea. This is patient's fifth hospitalization in the past three months. Patient has also had multiple ER visits for nausea and abdominal pain.    SOCIAL HISTORY:     reports that she quit smoking about 12 years ago. Her smoking use included cigarettes. She has a 2.50 pack-year smoking history. She has never used smokeless tobacco. She reports previous alcohol use. She reports current drug use. Drug: Marijuana.   Patient is not married. She lives at home with her mother and father. She has an adult daughter who lives in [place]. She had another daughter who is now deceased.  ADVANCE DIRECTIVES:  DNR on  file  CODE STATUS: DNR  PAST MEDICAL HISTORY: Past Medical History:  Diagnosis Date  . Abdominal pain 06/10/2018  . Abnormal cervical Papanicolaou smear 09/18/2017  . Anxiety   . Aortic atherosclerosis ([place])   . Arthritis    neck and knees  . 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 Blood transfusion without reported diagnosis   . Cervical cancer ([place]) 09/2016   mets lung  . Chronic anal fissure   . Chronic diarrhea   . Dyspnea   . Erosive gastropathy 09/18/2017  . Factor V Leiden mutation ([place])   . Fecal incontinence   . Genital warts   . GERD (gastroesophageal reflux disease)   . GI bleed 10/08/2018  . Heart murmur   . Hematochezia   . Hemorrhoids   . 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 Hepatitis, chronic ([place]) 05/05/2017  . History of cancer chemotherapy    completed 06/2017  . History of Clostridium difficile infection    while undergoing chemo.  Negative test 10/2017  . Ileocolic anastomotic leak   . 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 Intestinal infection due to Clostridium difficile 09/18/2017  . Macrocytic anemia with vitamin B12 deficiency   . Multiple gastric ulcers   . Nausea vomiting and diarrhea   . Pancolitis ([place]) 07/27/2018  . Perianal condylomata   . Pneumonia    History of  . Pulmonary nodules   . Rectal bleeding   . Small bowel obstruction ([place]) 08/2017  . Stiff neck    limited right turn  . Vitamin D deficiency     PAST SURGICAL HISTORY:  Past Surgical History:  Procedure Laterality Date  . CHOLECYSTECTOMY    .  COLON SURGERY  08/2017   resection  . COLONOSCOPY WITH PROPOFOL N/A 12/20/2017   Procedure: COLONOSCOPY WITH PROPOFOL;  Surgeon: [name], MD;  Location: [facility] ENDOSCOPY;  Service: Gastroenterology;  Laterality: N/A;  . COLONOSCOPY WITH PROPOFOL N/A 07/30/2018   Procedure: COLONOSCOPY WITH PROPOFOL;  Surgeon: [name], MD;  Location: [facility] ENDOSCOPY;   Service: Gastroenterology;  Laterality: N/A;  . COLONOSCOPY WITH PROPOFOL N/A 10/10/2018   Procedure: COLONOSCOPY WITH PROPOFOL;  Surgeon: Lucilla Lame, MD;  Location[facility]COPY;  Service: Endoscopy;  Laterality: N/A;  . DIAGNOSTIC LAPAROSCOPY    . ESOPHAGOGASTRODUODENOSCOPY (EGD) WITH PROPOFOL N/A 12/20/2017   Procedure: ESOPHAGOGASTRODUODENOSCOPY (EGD) WITH PROPOFOL;  Surgeon: [name], MD;  Location: [place];  Service: Gastroenterology;  Laterality: N/A;  . ESOPHAGOGASTRODUODENOSCOPY (EGD) WITH PROPOFOL  07/30/2018   Procedure: ESOPHAGOGASTRODUODENOSCOPY (EGD) WITH PROPOFOL;  Surgeon: [name], MD;  Location: [facility] ENDOSCOPY;  Service: Gastroenterology;;  . Otho Darner SIGMOIDOSCOPY N/A 11/21/2018   Procedure: FLEXIBLE SIGMOIDOSCOPY;  Surgeon: [name], MD;  Location: [facility] ENDOSCOPY;  Service: Gastroenterology;  Laterality: N/A;  . LAPAROTOMY N/A 08/31/2017   Procedure: EXPLORATORY LAPAROTOMY for SBO, ileocolectomy, removal of piece of uterine wall;  Surgeon: [name], MD;  Location: [facility] ORS;  Service: General;  Laterality: N/A;  . LASER ABLATION CONDOLAMATA N/A 02/22/2018   Procedure: LASER ABLATION/REMOVAL OF HALPFXTKWIO AROUND ANUS AND VAGINA;  Surgeon: Michael Boston, MD;  Location: [place];  Service: General;  Laterality: N/A;  . OOPHORECTOMY    . PORTA CATH INSERTION N/A 05/13/2018   Procedure: PORTA CATH INSERTION;  Surgeon: Katha Cabal, MD;  Location: [facility] CV LAB;  Service: Cardiovascular;  Laterality: N/A;  . SMALL INTESTINE SURGERY    . TANDEM RING INSERTION     x3  . THORACOTOMY    . TOTAL KNEE ARTHROPLASTY Left 04/24/2018   Procedure: TOTAL KNEE ARTHROPLASTY;  Surgeon: Lovell Sheehan, MD;  Location: [facility] ORS;  Service: Orthopedics;  Laterality: Left;    HEMATOLOGY/ONCOLOGY HISTORY:  Oncology History Overview Note  # dec 2017- CERVICAL ADENO CA [[place]]; Stage IB [Dr. Christene Slates at [facility] in [place], [place];No surgery- Chemo-RT;   # 2018-sep - lung nodules [in [place]]  # AUG 20th, 2019-#1 Carbo-Taxol s/p 6 cycles- [No avastin sec to blood clots]: Feb 2020-bilateral lung nodules with 1 cm in size. March 2020- finished carbo-taxol #7; poor tolerance to [place].  # April 15 th 2020- Start Taxol weekly x 3; one week OFF;  # July 2020-CT- bil cavitary lesions- slightly bigger by few mm/overall STABLE; CT a/p-NED. STOP Taxol;  # July , 23rd 2020- Keytruda [CPS-15 ]x 4 cycles; CT mid OCT 2020-progressive disease in the lungs.;  Stop [facility]  # Oct 22nd Alimta q 3 w  # summer-/fall 2019-Bil PE/kidney infract; factor V Leiden Leiden - xarelto [small bowel obstruction]; moved to [facility]   #Fall 2019 PE/renal infarct on Xarelto-factor V Leiden  # NGS/[facility]- PDL CPS 15; NEG for other targets**  ------------------------------------------------------------- She was treated by  Decision was made to pursue concurrent chemotherapy (weekly cisplatin) and radiation.  She received treatment from 11/2016-05/2017.  01/2017 cisplatin x 2 and carboplatin x 1 (01/29/2017) due to ARF and XRT.  XRT was followed by T & O on 02/01/2017 and T & N 02/10/2017 and 02/20/2017.  Course was complicated by 80 pound weight loss, nausea, vomiting, electrolyte wasting (potassium and magnesium).  She describes that.  Is been sick  constantly requiring at least 20 hospitalizations.  Follow-up CT chest and PET on 06/2017. Per patient, 'radiation worked' and no disease in the abdomen.  At that time she was noted to have lung nodules that were growing and follow-up imaging was scheduled for 10/2017.  She was admitted to hospital in [place] for small bowel obstruction which was managed conservatively and she was home for a week prior to traveling to [place] for Thanksgiving holiday where she has family.  She presented to ER in [place] on 08/2017 with nausea, vomiting, and  lower abdominal pain.  Symptoms did not respond to conservative treatment.   CT on 08/26/2017 revealed small bowel obstruction with transition in the pelvis just superior to the uterus rather was a long segment of distal ileum with fatty wall thickening compatible with chronic inflammation and/or radiation enteritis. Imaging showed numerous pulmonary nodules consistent with metastatic disease. She underwent laparotomy and right ileocolectomy on 08/31/2017 at [facility].  Surgical findings revealed a thickened, matted, and scarred piece of distal small bowel close to the ileocecal valve.  She was discharged on 09/05/2017.  Pain markedly increased in intensity and imaging was performed on 09/11/2017 which revealed: Debris within the anterior abdominal wall incision concerning for infection versus packing material, s/p post ileo-colectomy with expected postoperative changes, mild colonic ileus, numerous pulmonary nodules highly concerning for metastatic disease, punctate nonobstructing nephrolithiasis.  Staples were removed and one was packed.  She was started on doxycycline.  Abdominal and pelvic CT without contrast on 09/11/2017 revealed debris within anterior abdominal wall incision concerning for infection, versus packing material.   She is s/p ileocolectomy with expected postoperative changes and mild colonic ileus.  There were numerous pulmonary nodules highly concerning for metastatic disease and punctate nonobstructing nephrolithiasis. She was readmitted on 09/12/2017.  She describes the onset of lower abdominal pain on 09/09/2017.  Pain markedly increased in intensity on 09/11/2017.   Staples were removed and the wound packed.  She was started on doxycycline.  CT on 09/13/2017 showed postsurgical changes from ileocecectomy with primary ileocolic anastomosis without evidence of abscess or leak, edema small bowel loops of distal ileum, gas within ventral midline surgical wound corresponding to wound infection  versus packing material, small infarct at the inferior pole of left kidney, right uterine infarct.  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   PET scan was ordered to evaluate enlarging lung nodules with concern for recurrent cervical cancer but scan was delayed due to insurance and need to be performed in [place].  Presented to ER on 12/03/2017 for abdominal pain and emesis.  Imaging concerning for worsening possible uterine infarct and she was admitted to hospital.  Pelvic MRI was unremarkable.  Remote scarring type changes of uterus thought to be possibly related to radiation.  She was discharged on 12/08/2017.  Underwent endoscopy and colonoscopy on 12/20/2017.    On 02/22/2018 she underwent laser ablation of condylomata around the anus and vagina under anesthesia with Dr. Johney Maine.   04/15/2018:  Chest, abdomen, and pelvis CT revealed innumerable (> 100) cavitary nodules scattered in the lungs, moderately enlarging compared to the 11/08/2017 PET-CT, suspicious for metastatic disease.  One index node in the RLL measures 1.0 x 1.1 cm (previously 0.6 x 0.6 cm).  There were no new nodules.  There was an ill-defined wall thickening in the rectosigmoid with surrounding stranding along fascia planes, probably sequela from prior radiation therapy.  There was multilevel lumbar impingement  due to spondylosis and degenerative disc disease.  There was heterogeneous enhancement in the uterus (some possibly from prior radiation therapy).   04/23/2018:  PET scan revealed numerous scattered solid and cavitary nodules in the lungs stable increased in size compared to the prior PET-CT from 11/08/2017. Largest nodule was 1.1 cm in the LUL (SUV 1.9).  These demonstrated low-grade metabolic activity up to a maximum SUV of about 2.3, increased from 11/08/2017.    Case was discussed at tumor board on 04/25/2018. Consensus to pursue CT-guided biopsy (05/06/18) which revealed: Metastatic  adenocarcinoma, morphologically consistent with cervical adenocarcinoma.  She has history of chronic hepatitis C which is managed by GI.  Hepatitis C genotype is 2a/2c.  She receives B12 injections for history of B12 deficiency.  On 04/24/2018 she underwent left total knee replacement with Dr. [name]. --------------------------------------------------------   DIAGNOSIS: Cervical adenocarcinoma  STAGE: IV        ;GOALS: Palliative  CURRENT/MOST RECENT THERAPY : Alimta    Malignant neoplasm of overlapping sites of cervix ([place])  01/29/2019 - 04/23/2019 Chemotherapy   The patient had PACLitaxel (TAXOL) 156 mg in sodium chloride 0.9 % 250 mL chemo infusion (</= 47m/m2), 80 mg/m2 = 156 mg, Intravenous,  Once, 3 of 4 cycles Dose modification: 65 mg/m2 (original dose 80 mg/m2, Cycle 1, Reason: Provider Judgment) Administration: 156 mg (01/29/2019), 156 mg (02/05/2019), 126 mg (02/12/2019), 126 mg (02/26/2019), 126 mg (03/13/2019), 126 mg (03/27/2019), 126 mg (03/20/2019), 126 mg (04/03/2019), 126 mg (04/10/2019)  for chemotherapy treatment.    05/08/2019 - 07/30/2019 Chemotherapy   The patient had pembrolizumab (KEYTRUDA) 200 mg in sodium chloride 0.9 % 50 mL chemo infusion, 200 mg, Intravenous, Once, 4 of 6 cycles Administration: 200 mg (05/08/2019), 200 mg (05/29/2019), 200 mg (06/19/2019), 200 mg (07/10/2019)  for chemotherapy treatment.    08/07/2019 -  Chemotherapy   The patient had pegfilgrastim-cbqv ([facility] injection 6 mg, 6 mg, Subcutaneous, Once, 2 of 5 cycles Administration: 6 mg (08/29/2019), 6 mg (09/19/2019) PEMEtrexed (ALIMTA) 1,000 mg in sodium chloride 0.9 % 100 mL chemo infusion, 950 mg, Intravenous,  Once, 3 of 6 cycles Administration: 1,000 mg (08/07/2019), 1,000 mg (09/18/2019), 1,000 mg (08/28/2019)  for chemotherapy treatment.      ALLERGIES:  is allergic to ketamine.  MEDICATIONS:  Current Facility-Administered Medications  Medication Dose Route Frequency Provider Last Rate Last  Admin  . 0.9 % NaCl with KCl 20 mEq/ L  infusion   Intravenous Continuous WLoletha Grayer MD 50 mL/hr at 09/26/19 1400 Rate Verify at 09/26/19 1400  . acetaminophen (TYLENOL) tablet 650 mg  650 mg Oral Q6H PRN WLoletha Grayer MD       Or  . acetaminophen (TYLENOL) suppository 650 mg  650 mg Rectal Q6H PRN Wieting, Richard, MD      . albuterol (PROVENTIL) (2.5 MG/3ML) 0.083% nebulizer solution 2.5 mg  2.5 mg Inhalation Q6H PRN Wieting, Richard, MD      . amitriptyline (ELAVIL) tablet 75 mg  75 mg Oral QHS WLoletha Grayer MD   75 mg at 09/25/19 2102  . Chlorhexidine Gluconate Cloth 2 % PADS 6 each  6 each Topical Daily WLoletha Grayer MD   6 each at 09/26/19 1108  . ciprofloxacin (CIPRO) IVPB 400 mg  400 mg Intravenous Q12H WLoletha Grayer MD   Stopped at 09/26/19 0533  . diphenoxylate-atropine (LOMOTIL) 2.5-0.025 MG per tablet 2 tablet  2 tablet Oral BID PRN WLoletha Grayer MD      . folic acid (FOLVITE) tablet 1  mg  1 mg Oral Daily Wieting, Richard, MD   1 mg at 09/26/19 0848  . heparin ADULT infusion 100 units/mL (25000 units/281m sodium chloride 0.45%)  1,100 Units/hr Intravenous Continuous HHart RobinsonsA, RPH 11 mL/hr at 09/26/19 1400 1,100 Units/hr at 09/26/19 1400  . hyoscyamine (LEVBID) 0.375 MG 12 hr tablet 0.375 mg  0.375 mg Oral BID PRN WLoletha Grayer MD      . metroNIDAZOLE (FLAGYL) IVPB 500 mg  500 mg Intravenous Q8H Wieting, Richard, MD 100 mL/hr at 09/26/19 1436 500 mg at 09/26/19 1436  . morphine 2 MG/ML injection 2 mg  2 mg Intravenous Q4H PRN WLoletha Grayer MD   2 mg at 09/26/19 1328  . ondansetron (ZOFRAN) injection 4 mg  4 mg Intravenous Q6H WLoletha Grayer MD   4 mg at 09/26/19 1727  . oxyCODONE (Oxy IR/ROXICODONE) immediate release tablet 10-20 mg  10-20 mg Oral Q6H PRN WLoletha Grayer MD   20 mg at 09/26/19 1102  . pantoprazole (PROTONIX) injection 40 mg  40 mg Intravenous Q12H WLoletha Grayer MD   40 mg at 09/26/19 0847  . promethazine (PHENERGAN)  tablet 12.5 mg  12.5 mg Oral Q6H PRN WLoletha Grayer MD   12.5 mg at 09/26/19 1327  . sertraline (ZOLOFT) tablet 25 mg  25 mg Oral Daily WLoletha Grayer MD   25 mg at 09/26/19 0848   Facility-Administered Medications Ordered in Other Encounters  Medication Dose Route Frequency Provider Last Rate Last Admin  . heparin lock flush 100 unit/mL  500 Units Intravenous Once Corcoran, Melissa C, MD      . sodium chloride 0.9 % 1,000 mL with potassium chloride 20 mEq infusion   Intravenous Once GHonor LohE, NP      . sodium chloride flush (NS) 0.9 % injection 10 mL  10 mL Intravenous Once Tal Kempker, JKirt Boys NP        VITAL SIGNS: BP 119/78 (BP Location: Left Arm)   Pulse 77   Temp (!) 97.4 F (36.3 C) (Oral)   Resp 18   Ht 5' 8"  (1.727 m)   Wt 164 lb (74.4 kg)   SpO2 100%   BMI 24.94 kg/m  Filed Weights   09/25/19 0810  Weight: 164 lb (74.4 kg)    Estimated body mass index is 24.94 kg/m as calculated from the following:   Height as of this encounter: 5' 8"  (1.727 m).   Weight as of this encounter: 164 lb (74.4 kg).  LABS: CBC:    Component Value Date/Time   WBC 13.4 (H) 09/26/2019 0556   HGB 9.2 (L) 09/26/2019 0556   HCT 27.6 (L) 09/26/2019 0556   PLT 69 (L) 09/26/2019 0556   MCV 101.5 (H) 09/26/2019 0556   NEUTROABS 14.0 (H) 09/22/2019 1638   LYMPHSABS 1.1 09/22/2019 1638   MONOABS 0.1 09/22/2019 1638   EOSABS 0.1 09/22/2019 1638   BASOSABS 0.0 09/22/2019 1638   Comprehensive Metabolic Panel:    Component Value Date/Time   NA 132 (L) 09/26/2019 0556   K 3.4 (L) 09/26/2019 0556   CL 101 09/26/2019 0556   CO2 25 09/26/2019 0556   BUN 8 09/26/2019 0556   CREATININE 0.79 09/26/2019 0556   GLUCOSE 159 (H) 09/26/2019 0556   CALCIUM 8.5 (L) 09/26/2019 0556   AST 83 (H) 09/25/2019 1338   ALT 134 (H) 09/25/2019 1338   ALKPHOS 180 (H) 09/25/2019 1338   BILITOT 0.6 09/25/2019 1338   PROT 7.4 09/25/2019 1338   ALBUMIN 3.5  09/25/2019 1338    RADIOGRAPHIC STUDIES: DG  Chest 2 View  Result Date: 09/25/2019 CLINICAL DATA:  History of metastatic cervical cancer. Chest pain, diarrhea, nausea and blood in stool. EXAM: CHEST - 2 VIEW COMPARISON:  CT chest 08/01/2019. Single-view of the chest 01/26/2019 FINDINGS: Scattered pulmonary nodules are seen but are better visualized on the prior CT scan. Lungs are otherwise clear without consolidative process or edema. No pneumothorax or pleural effusion. Heart size is normal. No acute or focal bony abnormality. Port-A-Cath is in place. IMPRESSION: No acute disease in patient with bilateral pulmonary nodules from cervical carcinoma as seen on prior CT. Electronically Signed   By: Inge Rise M.D.   On: 09/25/2019 08:44   CT ABDOMEN PELVIS W CONTRAST  Result Date: 09/22/2019 CLINICAL DATA:  Acute abdominal pain EXAM: CT ABDOMEN AND PELVIS WITH CONTRAST TECHNIQUE: Multidetector CT imaging of the abdomen and pelvis was performed using the standard protocol following bolus administration of intravenous contrast. CONTRAST:  141m OMNIPAQUE IOHEXOL 300 MG/ML  SOLN COMPARISON:  08/13/2019 FINDINGS: Lower chest: Again noted are multiple small cavitary lesions in the bilateral lower lobes, significantly improved from prior study.The heart size is normal. Hepatobiliary: The liver is normal. Status post cholecystectomy.There is intrahepatic and extrahepatic biliary ductal dilatation which is similar to prior study. Pancreas: Normal contours without ductal dilatation. No peripancreatic fluid collection. Spleen: No splenic laceration or hematoma. Adrenals/Urinary Tract: --Adrenal glands: No adrenal hemorrhage. --Right kidney/ureter: No hydronephrosis or perinephric hematoma. --Left kidney/ureter: No hydronephrosis or perinephric hematoma. --Urinary bladder: The urinary bladder is decompressed which limits evaluation. Stomach/Bowel: --Stomach/Duodenum: No hiatal hernia or other gastric abnormality. Normal duodenal course and caliber. --Small  bowel: No dilatation or inflammation. --Colon: There is mild diffuse wall thickening throughout much of the colon, perhaps most evident at the splenic flexure. --Appendix: Surgically absent. Vascular/Lymphatic: Atherosclerotic calcification is present within the non-aneurysmal abdominal aorta, without hemodynamically significant stenosis. --No retroperitoneal lymphadenopathy. --No mesenteric lymphadenopathy. --No pelvic or inguinal lymphadenopathy. Reproductive: Unremarkable Other: No ascites or free air. The abdominal wall is normal. Musculoskeletal. There is stable asymmetric sclerosis involving the parasymphyseal left pubic bone. While this is stable from prior study, it is new from 01/23/2019. IMPRESSION: 1. Mild diffuse wall thickening throughout much of the colon, perhaps most evident at the splenic flexure. This could represent an infectious or inflammatory colitis. 2. Multiple small cavitary lesions in the bilateral lower lobes, significantly improved from prior study. These are consistent with improving metastatic lesions. 3. Sclerosis involving the parasymphyseal left pubic bone. This is new since April 2020. Findings may represent metastatic disease or a healing subacute fracture. Attention on follow-up examinations is recommended. Aortic Atherosclerosis (ICD10-I70.0). Electronically Signed   By: CConstance HolsterM.D.   On: 09/22/2019 19:29   DG Abd Acute W/Chest  Result Date: 09/16/2019 CLINICAL DATA:  Shortness of breath and abdominal pain. History of cervical carcinoma EXAM: DG ABDOMEN ACUTE W/ 1V CHEST COMPARISON:  Chest CT August 01, 2019; abdominal radiographs July 23, 2019; CT abdomen and pelvis July 22, 2019 FINDINGS: PA chest: The cavitary nodular lesions seen on chest CT approximately 7 weeks prior are not appreciable by radiography. There is mild chronic blunting of the left costophrenic angle. No edema or consolidation evident by radiography. Heart size and pulmonary vascularity are  normal. No adenopathy. Port-A-Cath tip is in the superior vena cava. Supine and upright abdomen: There is no bowel dilatation or air-fluid level to suggest bowel obstruction. No free air. Surgical clips are noted in the  gallbladder fossa region. No blastic or lytic bone lesions. There is degenerative change in the lumbar spine with scoliosis. There are apparent phleboliths in the pelvis. IMPRESSION: No bowel obstruction or free air demonstrable. Surgical clips in gallbladder fossa region. No edema or consolidation. The nodular lesions seen on recent CT are not appreciable by radiography. Port-A-Cath tip in superior vena cava. Cardiac silhouette within normal limits. Electronically Signed   By: Lowella Grip III M.D.   On: 09/16/2019 07:53    PERFORMANCE STATUS (ECOG) : 2 - Symptomatic, <50% confined to bed  Review of Systems Unless otherwise noted, a complete review of systems is negative.  Physical Exam General: NAD, frail appearing, thin Pulmonary: unlabored Extremities: no edema, no joint deformities Skin: no rashes Neurological: Weakness but otherwise nonfocal  IMPRESSION: Patient was admitted with diarrhea and abdominal pain.  She has been seen and evaluated by gastroenterology. Workup is ongoing.   Patient reports that her symptoms are improved today. She denies any acute changes or concerns at present.   Patient says that she hopes to bring her mother to the next clinic visit.  Patient has been wrestling with the decision on whether to pursue future treatment.  She ultimately wants quality of life and says she is hesitant to pursue further treatment if quality of life were to suffer.  However, when she is feeling good she says she generally is more optimistic regarding treatment.  Patient is a DNR/DNI.   PLAN: -Continue current scope of treatment -DNR/DNI -Will follow   Time Total: 30 minutes  Visit consisted of counseling and education dealing with the complex and  emotionally intense issues of symptom management and palliative care in the setting of serious and potentially life-threatening illness.Greater than 50%  of this time was spent counseling and coordinating care related to the above assessment and plan.  Signed by: Altha Harm, PhD, NP-C

## 2019-09-26 NOTE — Assessment & Plan Note (Signed)
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  Recent CT scan shows-evidence of colitis  #Cervical with metastasis to the lung-  Currently on Alimta-status post # 3 cycles; CT abdomen pelvis most recent shows response to therapy.  Patient is currently awaiting CT scan of the chest in the next 2 weeks or so.  #Abdominal pain nausea vomiting diarrhea-CT scan shows evidence of colitis; etiology is unclear-prior history of radiation/bowel resections; history of immune insufficiency; question related to immune related side effects from [facility].  Current chemotherapy Alimta is unlikely to cause severe diarrhea.  Appreciate GI evaluation.  # Adrenal insufficiency-continue hydrocortisone.  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   #Overall prognosis: DNR/DNI.  Unfortunately is poor.  patient is aware that she has incurable disease.  Followed by palliative care.  Thank you Dr.Weiting for allowing me to participate in the care of your pleasant patient. Please do not hesitate to contact me with questions or concerns in the interim.  Dr.Yu on call over the weekend for any consultations.

## 2019-09-26 NOTE — Progress Notes (Signed)
[place] for heparin Indication: Factor V deficiency/clots  Allergies  Allergen Reactions  . Ketamine Anxiety and Other (See Comments)    Syncope episode/confusion     Patient Measurements: Height: 5' 8"  (172.7 cm) Weight: 164 lb (74.4 kg) IBW/kg (Calculated) : 63.9 Heparin Dosing Weight: 74 kg  Vital Signs: Temp: 97.4 F (36.3 C) (12/11 0442) Temp Source: Oral (12/11 0442) BP: 119/78 (12/11 0442) Pulse Rate: 77 (12/11 0442)  Labs: Recent Labs    09/25/19 0810 09/25/19 1339 09/25/19 1550 09/26/19 0556  HGB 11.5*  --   --  9.2*  HCT 33.4*  --   --  27.6*  PLT 89*  --   --  69*  APTT  --   --  26  --   LABPROT 13.3  --   --   --   INR 1.0  --   --   --   HEPARINUNFRC  --   --  0.30  --   CREATININE 0.69  --   --  0.79  TROPONINIHS 13 8  --   --     Estimated Creatinine Clearance: 83 mL/min (by C-G formula based on SCr of 0.79 mg/dL).   Medical History: Past Medical History:  Diagnosis Date  . Abdominal pain 06/10/2018  . Abnormal cervical Papanicolaou smear 09/18/2017  . Anxiety   . Aortic atherosclerosis ([place])   . Arthritis    neck and knees  . 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 Blood transfusion without reported diagnosis   . Cervical cancer ([place]) 09/2016   mets lung  . Chronic anal fissure   . Chronic diarrhea   . Dyspnea   . Erosive gastropathy 09/18/2017  . Factor V Leiden mutation ([place])   . Fecal incontinence   . Genital warts   . GERD (gastroesophageal reflux disease)   . GI bleed 10/08/2018  . Heart murmur   . Hematochezia   . Hemorrhoids   . 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 Hepatitis, chronic ([place]) 05/05/2017  . History of cancer chemotherapy    completed 06/2017  . History of Clostridium difficile infection    while undergoing chemo.  Negative test 10/2017  . Ileocolic anastomotic leak   . 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  Intestinal infection due to Clostridium difficile 09/18/2017  . Macrocytic anemia with vitamin B12 deficiency   . Multiple gastric ulcers   . Nausea vomiting and diarrhea   . Pancolitis ([place]) 07/27/2018  . Perianal condylomata   . Pneumonia    History of  . Pulmonary nodules   . Rectal bleeding   . Small bowel obstruction ([place]) 08/2017  . Stiff neck    limited right turn  . 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 Patient takes 20 mg daily. Pharmacy consulted for heparin drip. Xarelto on hold. Last dose of Xarelto reported 12/10 am.  Goal of Therapy:  Heparin level 0.3-0.7 units/ml aPTT 66-102 seconds Monitor platelets by anticoagulation protocol: Yes   Plan:    Will order heparin 4000 unit bolus followed by heparin drip at 1100 units/hr to start 12/11 at 0930(approximately 24 hours since last Xarelto dose). Will follow APTT until correlation with HL. APTT 6 hr after start of drip. CBC daily while on  heparin drip.   Pearla Dubonnet, PharmD 09/26/2019,9:28 AM

## 2019-09-26 NOTE — Consult Note (Addendum)
Jonathon Bellows , MD [address], [place], [place], [place], [address] [address], [place], [place], [place], [address] Phone: [phone]  Fax: [phone]  Consultation  Referring Provider:    Dr. Vito Berger primary Care Physician:  Delsa Grana, PA-C Primary Gastroenterologist:  Dr. Marius Ditch       Reason for Consultation:   Nausea and vomiting   Date of Admission:  09/25/2019 Date of Consultation:  09/26/2019         HPI:   Joanna Hall is a 53 y.o. female with a known history of metastatic cervical cancer to the lungs presents with nausea vomiting and diarrhea.  She has had repeated admissions for similar episodes in the past.  She follows with Dr. Marius Ditch as an outpatient.  She had a small bowel obstruction in 2018 underwent exploratory laparotomy with ileocolectomy with class right hemicolectomy and primary anastomosis.  Felt secondary to additions.  Found to have Leiden 5 mutation.  On Xarelto.  Since the bowel resection episodes of diarrhea.  History of chronic hepatitis C treatment nave. She has responded well in the past to cholestyramine and Bentyl for the diarrhea.  She has undergone EGD and colonoscopy which was normal including biopsies.  Treated with budesonide in the past, Viberzi, Lomotil.  Seen by [facility] as well and who have not recommend any further intervention.  Celiac serologies, 5 HIAA levels, gastrin levels, VIP, chromogranin A were grossly normal.  Abdominal imaging showed no abnormalities.  09/26/2019: C. difficile negative, GI PCR in process.  BMP normal except for elevated glucose.  Hemoglobin 9.2 g with an MCV of 101.5.  Platelet count of 69.  Technetium of 1.5.  AST of 83 ALT of 134 and alkaline phosphatase of 180  09/22/2019: CT scan of the abdomen pelvis showed mild diffuse wall thickening throughout much of the colon most evident at the splenic flexure.  Infectious or inflammatory colitis.  Features of metastatic disease.  She says that the diarrhea  all began right after the abdominal surgery upto 15 times a day , associated with gas and bloating. When she passes gas itis foul smelling. Diarrhea has recurred overnight. She does consume sprite at home and 2 sachets of spenda daily. Denies any other artificial sugars in her diet .   Past Medical History:  Diagnosis Date  . Abdominal pain 06/10/2018  . Abnormal cervical Papanicolaou smear 09/18/2017  . Anxiety   . Aortic atherosclerosis ([place])   . Arthritis    neck and knees  . 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 Blood transfusion without reported diagnosis   . Cervical cancer ([place]) 09/2016   mets lung  . Chronic anal fissure   . Chronic diarrhea   . Dyspnea   . Erosive gastropathy 09/18/2017  . Factor V Leiden mutation ([place])   . Fecal incontinence   . Genital warts   . GERD (gastroesophageal reflux disease)   . GI bleed 10/08/2018  . Heart murmur   . Hematochezia   . Hemorrhoids   . 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 Hepatitis, chronic ([employer]) 05/05/2017  . History of cancer chemotherapy    completed 06/2017  . History of Clostridium difficile infection    while undergoing chemo.  Negative test 10/2017  . Ileocolic anastomotic leak   . 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 Intestinal infection due to Clostridium  difficile 09/18/2017  . Macrocytic anemia with vitamin B12 deficiency   . Multiple gastric ulcers   . Nausea vomiting and diarrhea   . Pancolitis ([place]) 07/27/2018  . Perianal condylomata   . Pneumonia    History of  . Pulmonary nodules   . Rectal bleeding   . Small bowel obstruction ([place]) 08/2017  . Stiff neck    limited right turn  . Vitamin D deficiency     Past Surgical History:  Procedure Laterality Date  . CHOLECYSTECTOMY    . COLON SURGERY  08/2017   resection  . COLONOSCOPY WITH PROPOFOL N/A 12/20/2017   Procedure: COLONOSCOPY WITH PROPOFOL;  Surgeon: [name], MD;  Location:  [facility] ENDOSCOPY;  Service: Gastroenterology;  Laterality: N/A;  . COLONOSCOPY WITH PROPOFOL N/A 07/30/2018   Procedure: COLONOSCOPY WITH PROPOFOL;  Surgeon: [name], MD;  Location: [facility] ENDOSCOPY;  Service: Gastroenterology;  Laterality: N/A;  . COLONOSCOPY WITH PROPOFOL N/A 10/10/2018   Procedure: COLONOSCOPY WITH PROPOFOL;  Surgeon: Lucilla Lame, MD;  Location: [facility] ENDOSCOPY;  Service: Endoscopy;  Laterality: N/A;  . DIAGNOSTIC LAPAROSCOPY    . ESOPHAGOGASTRODUODENOSCOPY (EGD) WITH PROPOFOL N/A 12/20/2017   Procedure: ESOPHAGOGASTRODUODENOSCOPY (EGD) WITH PROPOFOL;  Surgeon: [name], MD;  Location: [place];  Service: Gastroenterology;  Laterality: N/A;  . ESOPHAGOGASTRODUODENOSCOPY (EGD) WITH PROPOFOL  07/30/2018   Procedure: ESOPHAGOGASTRODUODENOSCOPY (EGD) WITH PROPOFOL;  Surgeon: [name], MD;  Location: [facility] ENDOSCOPY;  Service: Gastroenterology;;  . Otho Darner SIGMOIDOSCOPY N/A 11/21/2018   Procedure: FLEXIBLE SIGMOIDOSCOPY;  Surgeon: [name], MD;  Location: [facility] ENDOSCOPY;  Service: Gastroenterology;  Laterality: N/A;  . LAPAROTOMY N/A 08/31/2017   Procedure: EXPLORATORY LAPAROTOMY for SBO, ileocolectomy, removal of piece of uterine wall;  Surgeon: [name], MD;  Location: [facility] ORS;  Service: General;  Laterality: N/A;  . LASER ABLATION CONDOLAMATA N/A 02/22/2018   Procedure: LASER ABLATION/REMOVAL OF JYNWGNFAOZH AROUND ANUS AND VAGINA;  Surgeon: Michael Boston, MD;  Location: [place];  Service: General;  Laterality: N/A;  . OOPHORECTOMY    . PORTA CATH INSERTION N/A 05/13/2018   Procedure: PORTA CATH INSERTION;  Surgeon: Katha Cabal, MD;  Location: [facility] CV LAB;  Service: Cardiovascular;  Laterality: N/A;  . SMALL INTESTINE SURGERY    . TANDEM RING INSERTION     x3  . THORACOTOMY    . TOTAL KNEE ARTHROPLASTY Left 04/24/2018   Procedure: TOTAL KNEE ARTHROPLASTY;  Surgeon: Lovell Sheehan, MD;  Location:  [facility] ORS;  Service: Orthopedics;  Laterality: Left;    Prior to Admission medications   Medication Sig Start Date End Date Taking? Authorizing Provider  amitriptyline (ELAVIL) 75 MG tablet Take 1 tablet (75 mg total) by mouth at bedtime. 05/06/19 09/25/19 Yes Vanga, Tally Due, MD  amoxicillin-clavulanate (AUGMENTIN) 875-125 MG tablet Take 1 tablet by mouth 2 (two) times daily for 10 days. 09/22/19 10/02/19 Yes Arta Silence, MD  Calcium Carb-Cholecalciferol (CALCIUM 500 +D) 500-400 MG-UNIT TABS Take 2 tablets by mouth daily.   Yes [provider]  folic acid (FOLVITE) 1 MG tablet Take 1 tablet (1 mg total) by mouth daily. 07/31/19  Yes Cammie Sickle, MD  meloxicam (MOBIC) 15 MG tablet Take 15 mg by mouth daily as needed for pain.   Yes [provider]  pantoprazole (PROTONIX) 40 MG tablet Take 1 tablet (40 mg total) by mouth daily. 03/18/19  Yes Cammie Sickle, MD  potassium chloride SA (KLOR-CON) 20 MEQ tablet Take 1 tablet (20  mEq total) by mouth 2 (two) times daily. 09/10/19  Yes Verlon Au, NP  rivaroxaban (XARELTO) 20 MG TABS tablet Take 1 tablet (20 mg total) by mouth daily with supper. Patient taking differently: Take 20 mg by mouth daily with supper. Takes in the morning 05/05/19  Yes Cammie Sickle, MD  diphenoxylate-atropine (LOMOTIL) 2.5-0.025 MG tablet TAKE (2) TABLETS FOUR TIMES DAILY. Patient taking differently: Take 2 tablets by mouth 2 (two) times daily as needed for diarrhea or loose stools.  04/21/19   [name], MD  famotidine (PEPCID) 20 MG tablet Take 1 tablet (20 mg total) by mouth 2 (two) times daily for 5 days. Patient taking differently: Take 20 mg by mouth 2 (two) times daily as needed.  05/23/19 09/22/19  Duffy Bruce, MD  hyoscyamine (LEVBID) 0.375 MG 12 hr tablet Take 1 tablet (0.375 mg total) by mouth 2 (two) times daily. Patient taking differently: Take 0.375 mg by mouth 2 (two) times daily as needed for  cramping.  08/05/19   [name], MD  Oxycodone HCl 10 MG TABS Take 1-2 tablets (10-20 mg total) by mouth every 6 (six) hours as needed (for breakthrough pain). 09/16/19   Borders, Kirt Boys, NP  promethazine (PHENERGAN) 25 MG tablet Take 1 tablet (25 mg total) by mouth every 8 (eight) hours as needed for nausea or vomiting. 08/25/19   Cammie Sickle, MD  traMADol (ULTRAM) 50 MG tablet Take 50 mg by mouth every 6 (six) hours as needed.    [provider]  VENTOLIN HFA 108 (90 Base) MCG/ACT inhaler Inhale 1-2 puffs into the lungs every 4 (four) hours as needed for shortness of breath. 02/19/19   Verlon Au, NP    Family History  Problem Relation Age of Onset  . Hypertension Mother   . Hypertension Father   . Diabetes Father   . Hyperlipidemia Father   . Alcohol abuse Daughter   . Hypertension Maternal Grandmother   . Diabetes Maternal Grandmother   . Diabetes Paternal Grandmother   . Hypertension Paternal Grandmother      Social History   Tobacco Use  . Smoking status: Former Smoker    Packs/day: 0.25    Years: 10.00    Pack years: 2.50    Types: Cigarettes    Quit date: 10/16/2006    Years since quitting: 12.9  . Smokeless tobacco: Never Used  Substance Use Topics  . Alcohol use: Not Currently    Comment: seldom  . Drug use: Yes    Types: Marijuana    Comment: not very often    Allergies as of 09/25/2019 - Review Complete 09/25/2019  Allergen Reaction Noted  . Ketamine Anxiety and Other (See Comments) 02/22/2017    Review of Systems:    All systems reviewed and negative except where noted in HPI.   Physical Exam:  Vital signs in last 24 hours: Temp:  [97.4 F (36.3 C)-98.6 F (37 C)] 97.4 F (36.3 C) (12/11 0442) Pulse Rate:  [77-96] 77 (12/11 0442) Resp:  [16-22] 18 (12/11 0442) BP: (119-132)/(72-90) 119/78 (12/11 0442) SpO2:  [98 %-100 %] 100 % (12/11 0442) Last BM Date: 09/25/19 General:   Pleasant, cooperative in NAD Head:   Normocephalic and atraumatic. Eyes:   No icterus.   Conjunctiva pink. PERRLA. Ears:  Normal auditory acuity. Neck:  Supple; no masses or thyroidomegaly Lungs: Respirations even and unlabored. Lungs clear to auscultation bilaterally.   No wheezes, crackles, or rhonchi.  Heart:  Regular rate and rhythm;  Without murmur, clicks, rubs or gallops Abdomen:midline central scar  Soft, nondistended, nontender. Normal bowel sounds. No appreciable masses or hepatomegaly.  No rebound or guarding.  Neurologic:  Alert and oriented x3;  grossly normal neurologically. Skin:  Intact without significant lesions or rashes. Cervical Nodes:  No significant cervical adenopathy. Psych:  Alert and cooperative. Normal affect.  LAB RESULTS: Recent Labs    09/25/19 0810 09/26/19 0556  WBC 9.1 13.4*  HGB 11.5* 9.2*  HCT 33.4* 27.6*  PLT 89* 69*   BMET Recent Labs    09/25/19 0810 09/26/19 0556  NA 134* 132*  K 3.6 3.4*  CL 101 101  CO2 22 25  GLUCOSE 108* 159*  BUN 14 8  CREATININE 0.69 0.79  CALCIUM 9.3 8.5*   LFT Recent Labs    09/25/19 1338  PROT 7.4  ALBUMIN 3.5  AST 83*  ALT 134*  ALKPHOS 180*  BILITOT 0.6  BILIDIR 0.2  IBILI 0.4   PT/INR Recent Labs    09/25/19 0810  LABPROT 13.3  INR 1.0    STUDIES: DG Chest 2 View  Result Date: 09/25/2019 CLINICAL DATA:  History of metastatic cervical cancer. Chest pain, diarrhea, nausea and blood in stool. EXAM: CHEST - 2 VIEW COMPARISON:  CT chest 08/01/2019. Single-view of the chest 01/26/2019 FINDINGS: Scattered pulmonary nodules are seen but are better visualized on the prior CT scan. Lungs are otherwise clear without consolidative process or edema. No pneumothorax or pleural effusion. Heart size is normal. No acute or focal bony abnormality. Port-A-Cath is in place. IMPRESSION: No acute disease in patient with bilateral pulmonary nodules from cervical carcinoma as seen on prior CT. Electronically Signed   By: Inge Rise M.D.   On:  09/25/2019 08:44      Impression / Plan:   Joanna Hall is a 53 y.o. y/o female with a history of metastatic cervical cancer presents to the hospital with abdominal pain nausea vomiting and diarrhea.  She has had longstanding issues with nausea vomiting and diarrhea.  Has had a right hemicolectomy with ileocolonic anastomosis.  No clear cause has been attributed to chronic diarrhea.  Multiple modalities of therapy has been tried in the outpatient.  She has also been evaluated at [facility] for a second opinion and nothing more was added to the differential.  Reviewing her records I have not seen her prior evaluation of her diarrhea in terms of differentiating it from an osmotic from a secretory diarrhea.  I think I would like to perform this.  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    Plan 1.  Stool osmolalities to differentiate diarrhea if osmotic or secretory once infectious causes are ruled out.  Fecal calprotectin 2.  Stool chart documenting the number of bowel movements. 3.  Once stool has been sent for analysis please commence on a BRAT diet  4.  Watch for improvement or no change of diarrhea.  If no better consider start on Xifaxan for 14 days .  In addition could use cholestyramine, Lomotil once stool studies have been sent off. 5. Suggest to stop all sugary drinks, high fructose corn syrup containing beverages and artificial sugars in diet   Thank you  for involving me in the care of this patient.      LOS: 1 day   Jonathon Bellows, MD  09/26/2019, 10:26 AM

## 2019-09-27 DIAGNOSIS — D696 Thrombocytopenia, unspecified: Secondary | ICD-10-CM

## 2019-09-27 DIAGNOSIS — R197 Diarrhea, unspecified: Secondary | ICD-10-CM

## 2019-09-27 DIAGNOSIS — D638 Anemia in other chronic diseases classified elsewhere: Secondary | ICD-10-CM

## 2019-09-27 LAB — CBC
HCT: 26.6 % — ABNORMAL LOW (ref 36.0–46.0)
Hemoglobin: 8.8 g/dL — ABNORMAL LOW (ref 12.0–15.0)
MCH: 33.7 pg (ref 26.0–34.0)
MCHC: 33.1 g/dL (ref 30.0–36.0)
MCV: 101.9 fL — ABNORMAL HIGH (ref 80.0–100.0)
Platelets: 71 10*3/uL — ABNORMAL LOW (ref 150–400)
RBC: 2.61 MIL/uL — ABNORMAL LOW (ref 3.87–5.11)
RDW: 14.9 % (ref 11.5–15.5)
WBC: 12.1 10*3/uL — ABNORMAL HIGH (ref 4.0–10.5)
nRBC: 0 % (ref 0.0–0.2)

## 2019-09-27 LAB — BASIC METABOLIC PANEL
Anion gap: 7 (ref 5–15)
BUN: <5 mg/dL — ABNORMAL LOW (ref 6–20)
CO2: 22 mmol/L (ref 22–32)
Calcium: 8.5 mg/dL — ABNORMAL LOW (ref 8.9–10.3)
Chloride: 106 mmol/L (ref 98–111)
Creatinine, Ser: 0.79 mg/dL (ref 0.44–1.00)
GFR calc Af Amer: >60 mL/min (ref >60)
GFR calc non Af Amer: >60 mL/min (ref >60)
Glucose, Bld: 104 mg/dL — ABNORMAL HIGH (ref 70–99)
Potassium: 3.5 mmol/L (ref 3.5–5.1)
Sodium: 135 mmol/L (ref 135–145)

## 2019-09-27 LAB — APTT
aPTT: 88 seconds — ABNORMAL HIGH (ref 24–36)
aPTT: 89 seconds — ABNORMAL HIGH (ref 24–36)

## 2019-09-27 LAB — OCCULT BLOOD X 1 CARD TO LAB, STOOL: Fecal Occult Bld: NEGATIVE

## 2019-09-27 LAB — MAGNESIUM: Magnesium: 1.7 mg/dL (ref 1.7–2.4)

## 2019-09-27 LAB — HEPARIN LEVEL (UNFRACTIONATED): Heparin Unfractionated: 0.28 IU/mL — ABNORMAL LOW (ref 0.30–0.70)

## 2019-09-27 MED ORDER — MAGNESIUM SULFATE 2 GM/50ML IV SOLN
2.0000 g | Freq: Once | INTRAVENOUS | Status: AC
  Administered 2019-09-27: 2 g via INTRAVENOUS
  Filled 2019-09-27: qty 50

## 2019-09-27 MED ORDER — CHOLESTYRAMINE 4 G PO PACK
4.0000 g | PACK | Freq: Two times a day (BID) | ORAL | Status: DC
  Administered 2019-09-27 – 2019-09-29 (×4): 4 g via ORAL
  Filled 2019-09-27 (×5): qty 1

## 2019-09-27 NOTE — Progress Notes (Signed)
[place] for heparin Indication: Factor V deficiency/clots  Allergies  Allergen Reactions  . Ketamine Anxiety and Other (See Comments)    Syncope episode/confusion    Patient Measurements: Height: 5' 8"  (172.7 cm) Weight: 164 lb (74.4 kg) IBW/kg (Calculated) : 63.9 Heparin Dosing Weight: 74 kg  Vital Signs: Temp: 98.2 F (36.8 C) (12/12 0512) Temp Source: Oral (12/12 0512) BP: 120/82 (12/12 0512) Pulse Rate: 79 (12/12 0512)  Labs: Recent Labs    09/25/19 0810 09/25/19 0810 09/25/19 1339 09/25/19 1550 09/26/19 0556 09/26/19 2302 09/27/19 0518 09/27/19 1101  HGB 11.5*  --   --   --  9.2*  --  8.8*  --   HCT 33.4*  --   --   --  27.6*  --  26.6*  --   PLT 89*  --   --   --  69*  --  71*  --   APTT  --    < >  --  26  --  67* 88* 89*  LABPROT 13.3  --   --   --   --   --   --   --   INR 1.0  --   --   --   --   --   --   --   HEPARINUNFRC  --   --   --  0.30  --   --  0.28*  --   CREATININE 0.69  --   --   --  0.79  --  0.79  --   TROPONINIHS 13  --  8  --   --   --   --   --    < > = values in this interval not displayed.   Estimated Creatinine Clearance: 83 mL/min (by C-G formula based on SCr of 0.79 mg/dL).  Medical History: Past Medical History:  Diagnosis Date  . Abdominal pain 06/10/2018  . Abnormal cervical Papanicolaou smear 09/18/2017  . Anxiety   . Aortic atherosclerosis ([place])   . Arthritis    neck and knees  . 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 Blood transfusion without reported diagnosis   . Cervical cancer ([place]) 09/2016   mets lung  . Chronic anal fissure   . Chronic diarrhea   . Dyspnea   . Erosive gastropathy 09/18/2017  . Factor V Leiden mutation ([place])   . Fecal incontinence   . Genital warts   . GERD (gastroesophageal reflux disease)   . GI bleed 10/08/2018  . Heart murmur   . Hematochezia   . Hemorrhoids   . 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  Hepatitis, chronic ([place]) 05/05/2017  . History of cancer chemotherapy    completed 06/2017  . History of Clostridium difficile infection    while undergoing chemo.  Negative test 10/2017  . Ileocolic anastomotic leak   . 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 Intestinal infection due to Clostridium difficile 09/18/2017  . Macrocytic anemia with vitamin B12 deficiency   . Multiple gastric ulcers   . Nausea vomiting and diarrhea   . Pancolitis ([place]) 07/27/2018  . Perianal condylomata   . Pneumonia    History of  . Pulmonary nodules   . Rectal bleeding   . Small bowel obstruction ([place]) 08/2017  . Stiff neck    limited right turn  .  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 Patient takes 20 mg daily. Pharmacy consulted for heparin drip. Xarelto on hold. Last dose of Xarelto reported 12/10 am.  Ordered heparin 4000 unit bolus followed by heparin drip at 1100 units/hr to start 12/11 at 0930 (approximately 24 hours since last Xarelto dose)  1211 1546 APTT 60 seconds (subtherapeutic) 1211 2302 APTT 67 seconds, therapeutic barely at goal (heparin still infusing at 1100 units/hr) - heparin increased to 1200 units/hr   Goal of Therapy:  Heparin level 0.3-0.7 units/ml aPTT 66-102 seconds Monitor platelets by anticoagulation protocol: Yes   Plan:  12/12 1101 APTT 89 therapeutic x 2. Continue heparin at 1200 units/hr. APTT/HL/CBC with morning labs.  Will likely follow HL starting in the morning.   CBC daily while on heparin drip.   Tawnya Crook, PharmD Clinical Pharmacist 09/27/2019 12:22 PM

## 2019-09-27 NOTE — Progress Notes (Addendum)
[place] for heparin Indication: Factor V deficiency/clots  Allergies  Allergen Reactions  . Ketamine Anxiety and Other (See Comments)    Syncope episode/confusion    Patient Measurements: Height: 5' 8"  (172.7 cm) Weight: 164 lb (74.4 kg) IBW/kg (Calculated) : 63.9 Heparin Dosing Weight: 74 kg  Vital Signs: Temp: 98.5 F (36.9 C) (12/11 1831) Temp Source: Oral (12/11 1831) BP: 131/88 (12/11 1831) Pulse Rate: 73 (12/11 1831)  Labs: Recent Labs    09/25/19 0810 09/25/19 1339 09/25/19 1550 09/26/19 0556 09/26/19 1546 09/26/19 2302  HGB 11.5*  --   --  9.2*  --   --   HCT 33.4*  --   --  27.6*  --   --   PLT 89*  --   --  69*  --   --   APTT  --   --  26  --  60* 67*  LABPROT 13.3  --   --   --   --   --   INR 1.0  --   --   --   --   --   HEPARINUNFRC  --   --  0.30  --   --   --   CREATININE 0.69  --   --  0.79  --   --   TROPONINIHS 13 8  --   --   --   --    Estimated Creatinine Clearance: 83 mL/min (by C-G formula based on SCr of 0.79 mg/dL).  Medical History: Past Medical History:  Diagnosis Date  . Abdominal pain 06/10/2018  . Abnormal cervical Papanicolaou smear 09/18/2017  . Anxiety   . Aortic atherosclerosis ([place])   . Arthritis    neck and knees  . 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 Blood transfusion without reported diagnosis   . Cervical cancer ([place]) 09/2016   mets lung  . Chronic anal fissure   . Chronic diarrhea   . Dyspnea   . Erosive gastropathy 09/18/2017  . Factor V Leiden mutation ([place])   . Fecal incontinence   . Genital warts   . GERD (gastroesophageal reflux disease)   . GI bleed 10/08/2018  . Heart murmur   . Hematochezia   . Hemorrhoids   . 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 Hepatitis, chronic ([place]) 05/05/2017  . History of cancer chemotherapy    completed 06/2017  . History of Clostridium difficile infection    while undergoing chemo.   Negative test 10/2017  . Ileocolic anastomotic leak   . 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 Intestinal infection due to Clostridium difficile 09/18/2017  . Macrocytic anemia with vitamin B12 deficiency   . Multiple gastric ulcers   . Nausea vomiting and diarrhea   . Pancolitis ([place]) 07/27/2018  . Perianal condylomata   . Pneumonia    History of  . Pulmonary nodules   . Rectal bleeding   . Small bowel obstruction ([place]) 08/2017  . Stiff neck    limited right turn  . 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 Patient takes 20 mg daily. Pharmacy consulted for heparin drip. Xarelto on hold. Last dose of Xarelto reported 12/10 am.  Ordered heparin 4000 unit bolus followed by heparin drip at 1100 units/hr to start  12/11 at 0930 (approximately 24 hours since last Xarelto dose)  1211 1546 APTT 60 seconds (subtherapeutic) 1211 2302 APTT 67 seconds, therapeutic barely at goal (heparin still infusing at 1100 units/hr)  Goal of Therapy:  Heparin level 0.3-0.7 units/ml aPTT 66-102 seconds Monitor platelets by anticoagulation protocol: Yes   Plan:  Will increase heparin drip to 1200 units/hour to discourage drop to subtherapeutic level  Will reassess APTT in 8 hours to confirm  Will follow APTT until correlation with HL.   CBC/HL daily while on heparin drip.   Ena Dawley, PharmD Clinical Pharmacist 09/27/2019 12:05 AM

## 2019-09-27 NOTE — Progress Notes (Signed)
Patient ID: Joanna Hall, female   DOB: Jun 16, 1966, 53 y.o.   MRN: [id number] [facility] PROGRESS NOTE  ALEXCIS BICKING NGE:[id number] DOB: [birth year] DOA: 09/25/2019 PCP: Delsa Grana, PA-C  HPI/Subjective: Patient feeling better today morning to start some food.  Still had quite a bit of diarrhea yesterday.  Still has some nausea.  Still has lower abdominal pain cramping pain.  Objective: Vitals:   09/27/19 0512 09/27/19 1259  BP: 120/82 119/78  Pulse: 79 76  Resp: 18 18  Temp: 98.2 F (36.8 C) 98.8 F (37.1 C)  SpO2: 99% 100%    Intake/Output Summary (Last 24 hours) at 09/27/2019 1435 Last data filed at 09/27/2019 1248 Gross per 24 hour  Intake 2076.27 ml  Output --  Net 2076.27 ml   Filed Weights   09/25/19 0810  Weight: 74.4 kg    ROS: Review of Systems  Constitutional: Negative for chills and fever.  Eyes: Negative for blurred vision.  Respiratory: Negative for cough and shortness of breath.   Cardiovascular: Negative for chest pain.  Gastrointestinal: Positive for abdominal pain, diarrhea and nausea. Negative for constipation and vomiting.  Genitourinary: Negative for dysuria.  Musculoskeletal: Negative for joint pain.  Neurological: Negative for dizziness and headaches.   Exam: Physical Exam  Constitutional: She is oriented to person, place, and time.  HENT:  Nose: No mucosal edema.  Mouth/Throat: No oropharyngeal exudate or posterior oropharyngeal edema.  Eyes: Pupils are equal, round, and reactive to light. Conjunctivae, EOM and lids are normal.  Neck: No JVD present. Carotid bruit is not present. No thyroid mass and no thyromegaly present.  Cardiovascular: S1 normal and S2 normal. Exam reveals no gallop.  No murmur heard. Pulses:      Dorsalis pedis pulses are 2+ on the right side and 2+ on the left side.  Respiratory: No respiratory distress. She has no decreased breath sounds. She has no wheezes. She has no rhonchi. She has no rales.   GI: Soft. Bowel sounds are normal. There is generalized abdominal tenderness.  Musculoskeletal:     Cervical back: No edema.     Right ankle: No swelling.     Left ankle: No swelling.  Lymphadenopathy:    She has no cervical adenopathy.  Neurological: She is alert and oriented to person, place, and time. No cranial nerve deficit.  Skin: Skin is warm. No rash noted. Nails show no clubbing.  Psychiatric: She has a normal mood and affect.      Data Reviewed: Basic Metabolic Panel: Recent Labs  Lab 09/22/19 1638 09/25/19 0810 09/26/19 0556 09/27/19 0518  NA 133* 134* 132* 135  K 3.7 3.6 3.4* 3.5  CL 97* 101 101 106  CO2 21* 22 25 22   GLUCOSE 156* 108* 159* 104*  BUN 18 14 8  <5*  CREATININE 0.59 0.69 0.79 0.79  CALCIUM 9.7 9.3 8.5* 8.5*  MG  --   --  1.5* 1.7   Liver Function Tests: Recent Labs  Lab 09/22/19 1638 09/25/19 1338  AST 119* 83*  ALT 191* 134*  ALKPHOS 213* 180*  BILITOT 1.1 0.6  PROT 8.3* 7.4  ALBUMIN 4.1 3.5   Recent Labs  Lab 09/22/19 1638 09/25/19 1339  LIPASE 28 23   CBC: Recent Labs  Lab 09/22/19 1638 09/25/19 0810 09/26/19 0556 09/27/19 0518  WBC 16.3* 9.1 13.4* 12.1*  NEUTROABS 14.0*  --   --   --   HGB 13.6 11.5* 9.2* 8.8*  HCT 39.7 33.4* 27.6* 26.6*  MCV  97.5 97.4 101.5* 101.9*  PLT 215 89* 69* 71*   BNP (last 3 results) Recent Labs    05/23/19 1219  BNP 23.0      Recent Results (from the past 240 hour(s))  SARS CORONAVIRUS 2 (TAT 6-24 HRS) Nasopharyngeal Nasopharyngeal Swab     Status: None   Collection Time: 09/25/19  3:26 PM   Specimen: Nasopharyngeal Swab  Result Value Ref Range Status   SARS Coronavirus 2 NEGATIVE NEGATIVE Final    Comment: (NOTE) SARS-CoV-2 target nucleic acids are NOT DETECTED. The SARS-CoV-2 RNA is generally detectable in upper and lower respiratory specimens during the acute phase of infection. Negative results do not preclude SARS-CoV-2 infection, do not rule out co-infections with other  pathogens, and should not be used as the sole basis for treatment or other patient management decisions. Negative results must be combined with clinical observations, patient history, and epidemiological information. The expected result is Negative. Fact Sheet for Patients: SugarRoll.be Fact Sheet for Healthcare Providers: https://www.woods-mathews.com/ This test is not yet approved or cleared by the [place] FDA and  has been authorized for detection and/or diagnosis of SARS-CoV-2 by FDA under an Emergency Use Authorization (EUA). This EUA will remain  in effect (meaning this test can be used) for the duration of the COVID-19 declaration under Section 56 4(b)(1) of the Act, 21 U.S.C. section 360bbb-3(b)(1), unless the authorization is terminated or revoked sooner. Performed at [facility], [place] [address] [place], [place] [address]   C difficile quick scan w PCR reflex     Status: None   Collection Time: 09/26/19 12:46 AM   Specimen: STOOL  Result Value Ref Range Status   C Diff antigen NEGATIVE NEGATIVE Final   C Diff toxin NEGATIVE NEGATIVE Final   C Diff interpretation No C. difficile detected.  Final    Comment: Performed at [facility], [address] [place], [place] [address]      Scheduled Meds: . amitriptyline  75 mg Oral QHS  . Chlorhexidine Gluconate Cloth  6 each Topical Daily  . cholestyramine  4 g Oral BID  . folic acid  1 mg Oral Daily  . ondansetron (ZOFRAN) IV  4 mg Intravenous Q6H  . pantoprazole (PROTONIX) IV  40 mg Intravenous Q12H  . sertraline  25 mg Oral Daily   Continuous Infusions: . 0.9 % NaCl with KCl 20 mEq / L 50 mL/hr at 09/27/19 1248  . ciprofloxacin Stopped (09/27/19 0511)  . heparin 1,200 Units/hr (09/27/19 1248)  . metronidazole Stopped (09/27/19 0735)    Assessment/Plan:  1. Acute colitis with nausea vomiting diarrhea and abdominal pain.  Likely due to chemotherapy  and/or prior radiation.  Stool for C. difficile negative.  Stool comprehensive panel still pending.  Empiric antibiotics. Appreciate GI consultation.  Standing dose Zofran.  IV fluid hydration with potassium.  Advance to to solid food.  Trial of cholestyramine.  Order ova and parasites and cryptosporidium and isospora. 2. 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 Thrombocytopenia secondary to recent chemotherapy.  Anemia secondary to chemotherapy.  The patient is weighing benefits and risks of continuing chemotherapy and treatments versus hospice.  She will continue to have this conversation with Dr. Rogue Bussing. 3. Anxiety depression on Zoloft and Elavil 4. 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  Patient is on heparin just in case GI wants to do procedure.  Hopefully will be able to switch over to Xarelto tomorrow morning.  Code Status:  Code Status Orders  (From admission, onward)         Start     Ordered   09/25/19 1501  Do not attempt resuscitation (DNR)  Continuous    Question Answer Comment  In the event of cardiac or respiratory ARREST Do not call a "code blue"   In the event of cardiac or respiratory ARREST Do not perform Intubation, CPR, defibrillation or ACLS   In the event of cardiac or respiratory ARREST Use medication by any route, position, wound care, and other measures to relive pain and suffering. May use oxygen, suction and manual treatment of airway obstruction as needed for comfort.   Comments Nurse may pronounce      09/25/19 1501        Code Status History    Date Active Date Inactive Code Status Order ID Comments User Context   08/14/2019 1033 08/19/2019 1510 DNR [id number]  Irean Hong, NP Inpatient   08/13/2019 1443 08/14/2019 1033 Full Code [id number]  Demetrios Loll, MD ED   07/22/2019 1658 07/24/2019 1436 Full Code [id number]  Henreitta Leber, MD Inpatient   06/21/2019 1305 06/25/2019 2037 Full Code [id number]  Lang Snow, NP ED    06/14/2019 0418 06/15/2019 1902 Full Code [id number]  Mansy, Arvella Merles, MD ED   06/07/2019 1536 06/10/2019 2044 Full Code [id number]  Mayo, Pete Pelt, MD Inpatient   05/19/2019 0313 05/22/2019 1545 Full Code [id number]  Mayer Camel, NP Inpatient   11/19/2018 1803 11/26/2018 2238 Full Code [id number]  Nicholes Mango, MD Inpatient   10/17/2018 1910 10/18/2018 2119 Full Code [id number]  Hillary Bow, MD Inpatient   10/08/2018 1443 10/11/2018 1920 Full Code [id number]  Nicholes Mango, MD Inpatient   07/27/2018 1621 08/01/2018 1432 Full Code [id number]  Demetrios Loll, MD Inpatient   06/10/2018 1754 06/12/2018 1614 Full Code [id number]  Dustin Flock, MD Inpatient   04/24/2018 1451 04/27/2018 1700 Full Code [id number]  Lovell Sheehan, MD Inpatient   12/03/2017 1619 12/08/2017 1606 Full Code [id number]  Epifanio Lesches, MD ED   09/12/2017 0024 09/15/2017 1721 Full Code [id number]  [name], MD ED   08/27/2017 0223 09/05/2017 1355 Full Code [id number]  Herbert Pun, MD Inpatient   Advance Care Planning Activity    Advance Directive Documentation     Most Recent Value  Type of Advance Directive  Out of facility DNR (pink MOST or yellow form)  Pre-existing out of facility DNR order (yellow form or pink MOST form)  --  "MOST" Form in Place?  --     Disposition Plan: Diarrhea will need to slow down prior to disposition  Consultants:  Gastroenterology  Hematology  Time spent: 28 minutes  [place]

## 2019-09-27 NOTE — Progress Notes (Addendum)
[place] for heparin Indication: Factor V deficiency/clots  Allergies  Allergen Reactions  . Ketamine Anxiety and Other (See Comments)    Syncope episode/confusion    Patient Measurements: Height: 5' 8"  (172.7 cm) Weight: 164 lb (74.4 kg) IBW/kg (Calculated) : 63.9 Heparin Dosing Weight: 74 kg  Vital Signs: Temp: 98.2 F (36.8 C) (12/12 0512) Temp Source: Oral (12/12 0512) BP: 120/82 (12/12 0512) Pulse Rate: 79 (12/12 0512)  Labs: Recent Labs    09/25/19 0810 09/25/19 0810 09/25/19 1339 09/25/19 1550 09/26/19 0556 09/26/19 1546 09/26/19 2302 09/27/19 0518  HGB 11.5*  --   --   --  9.2*  --   --  8.8*  HCT 33.4*  --   --   --  27.6*  --   --  26.6*  PLT 89*  --   --   --  69*  --   --  71*  APTT  --    < >  --  26  --  60* 67* 88*  LABPROT 13.3  --   --   --   --   --   --   --   INR 1.0  --   --   --   --   --   --   --   HEPARINUNFRC  --   --   --  0.30  --   --   --  0.28*  CREATININE 0.69  --   --   --  0.79  --   --  0.79  TROPONINIHS 13  --  8  --   --   --   --   --    < > = values in this interval not displayed.   Estimated Creatinine Clearance: 83 mL/min (by C-G formula based on SCr of 0.79 mg/dL).  Medical History: Past Medical History:  Diagnosis Date  . Abdominal pain 06/10/2018  . Abnormal cervical Papanicolaou smear 09/18/2017  . Anxiety   . Aortic atherosclerosis ([place])   . Arthritis    neck and knees  . 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 Blood transfusion without reported diagnosis   . Cervical cancer ([place]) 09/2016   mets lung  . Chronic anal fissure   . Chronic diarrhea   . Dyspnea   . Erosive gastropathy 09/18/2017  . Factor V Leiden mutation ([place])   . Fecal incontinence   . Genital warts   . GERD (gastroesophageal reflux disease)   . GI bleed 10/08/2018  . Heart murmur   . Hematochezia   . Hemorrhoids   . 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  Hepatitis, chronic ([place]) 05/05/2017  . History of cancer chemotherapy    completed 06/2017  . History of Clostridium difficile infection    while undergoing chemo.  Negative test 10/2017  . Ileocolic anastomotic leak   . 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 Intestinal infection due to Clostridium difficile 09/18/2017  . Macrocytic anemia with vitamin B12 deficiency   . Multiple gastric ulcers   . Nausea vomiting and diarrhea   . Pancolitis ([place]) 07/27/2018  . Perianal condylomata   . Pneumonia    History of  . Pulmonary nodules   . Rectal bleeding   . Small bowel obstruction ([place]) 08/2017  . Stiff neck    limited right turn  .  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 Patient takes 20 mg daily. Pharmacy consulted for heparin drip. Xarelto on hold. Last dose of Xarelto reported 12/10 am.  Ordered heparin 4000 unit bolus followed by heparin drip at 1100 units/hr to start 12/11 at 0930 (approximately 24 hours since last Xarelto dose)  1211 1546 APTT 60 seconds (subtherapeutic) 1211 2302 APTT 67 seconds, therapeutic barely at goal (heparin still infusing at 1100 units/hr) - heparin increased to 1200 units/hr   Goal of Therapy:  Heparin level 0.3-0.7 units/ml aPTT 66-102 seconds Monitor platelets by anticoagulation protocol: Yes   Plan:  12/12 0518 APTT 88 therapeutic (HL subtherapeutic). Continue heparin at 1200 units/hr. APTT at 1100 to confirm.  Will likely follow HL starting in the morning.   CBC daily while on heparin drip.   Tawnya Crook, PharmD Clinical Pharmacist 09/27/2019 7:25 AM

## 2019-09-27 NOTE — Progress Notes (Signed)
Joanna Antigua, MD [address], [place], [place], [place], [address] [address], [place], [place], [place], [address] Phone: [phone]  Fax: [phone]   Subjective:  Patient has been receiving Lomotil.  Continues to have diarrhea.  Objective: Exam: Vital signs in last 24 hours: Vitals:   09/26/19 0442 09/26/19 1831 09/27/19 0512 09/27/19 1259  BP: 119/78 131/88 120/82 119/78  Pulse: 77 73 79 76  Resp: 18 18 18 18   Temp: (!) 97.4 F (36.3 C) 98.5 F (36.9 C) 98.2 F (36.8 C) 98.8 F (37.1 C)  TempSrc: Oral Oral Oral   SpO2: 100% 100% 99% 100%  Weight:      Height:       Weight change:   Intake/Output Summary (Last 24 hours) at 09/27/2019 1607 Last data filed at 09/27/2019 1248 Gross per 24 hour  Intake 2076.27 ml  Output --  Net 2076.27 ml    General: No acute distress, AAO x3 Abd: Soft, NT/ND, No HSM Skin: Warm, no rashes Neck: Supple, Trachea midline   Lab Results: Lab Results  Component Value Date   WBC 12.1 (H) 09/27/2019   HGB 8.8 (L) 09/27/2019   HCT 26.6 (L) 09/27/2019   MCV 101.9 (H) 09/27/2019   PLT 71 (L) 09/27/2019   Micro Results: Recent Results (from the past 240 hour(s))  SARS CORONAVIRUS 2 (TAT 6-24 HRS) Nasopharyngeal Nasopharyngeal Swab     Status: None   Collection Time: 09/25/19  3:26 PM   Specimen: Nasopharyngeal Swab  Result Value Ref Range Status   SARS Coronavirus 2 NEGATIVE NEGATIVE Final    Comment: (NOTE) SARS-CoV-2 target nucleic acids are NOT DETECTED. The SARS-CoV-2 RNA is generally detectable in upper and lower respiratory specimens during the acute phase of infection. Negative results do not preclude SARS-CoV-2 infection, do not rule out co-infections with other pathogens, and should not be used as the sole basis for treatment or other patient management decisions. Negative results must be combined with clinical observations, patient history, and epidemiological information. The expected result is  Negative. Fact Sheet for Patients: SugarRoll.be Fact Sheet for Healthcare Providers: https://www.woods-mathews.com/ This test is not yet approved or cleared by the [place] FDA and  has been authorized for detection and/or diagnosis of SARS-CoV-2 by FDA under an Emergency Use Authorization (EUA). This EUA will remain  in effect (meaning this test can be used) for the duration of the COVID-19 declaration under Section 56 4(b)(1) of the Act, 21 U.S.C. section 360bbb-3(b)(1), unless the authorization is terminated or revoked sooner. Performed at [facility], [place] [address] [place], [place] [address]   C difficile quick scan w PCR reflex     Status: None   Collection Time: 09/26/19 12:46 AM   Specimen: STOOL  Result Value Ref Range Status   C Diff antigen NEGATIVE NEGATIVE Final   C Diff toxin NEGATIVE NEGATIVE Final   C Diff interpretation No C. difficile detected.  Final    Comment: Performed at [facility], [address] [place], [place] [address]   Studies/Results: No results found. Medications:  Scheduled Meds: . amitriptyline  75 mg Oral QHS  . Chlorhexidine Gluconate Cloth  6 each Topical Daily  . cholestyramine  4 g Oral BID  . folic acid  1 mg Oral Daily  . ondansetron (ZOFRAN) IV  4 mg Intravenous Q6H  . pantoprazole (PROTONIX) IV  40 mg Intravenous Q12H  . sertraline  25 mg Oral Daily   Continuous Infusions: . 0.9 %  NaCl with KCl 20 mEq / L 50 mL/hr at 09/27/19 1248  . ciprofloxacin Stopped (09/27/19 0511)  . heparin 1,200 Units/hr (09/27/19 1248)  . metronidazole 500 mg (09/27/19 1606)   PRN Meds:.acetaminophen **OR** acetaminophen, albuterol, diphenoxylate-atropine, hyoscyamine, morphine injection, oxyCODONE, promethazine   Assessment: Active Problems:   Anemia of chronic disease   Metastasis from cervical cancer (HCC)   Nausea vomiting and diarrhea   Chest pain   Acute  colitis    Plan: GI panel, Cryptosporidium and Giardia testing, C. difficile testing is all pending  Dr. Orvil Feil had evaluated the patient and reviewed previous record and recommended stool osmolalities  Unsure if stool osmolalities are able to be done as an inpatient.  If so, please order as that may help [place] differentiate between osmotic or secretory diarrhea given her chronic symptoms   LOS: 2 days   Joanna Antigua, MD 09/27/2019, 4:07 PM

## 2019-09-28 DIAGNOSIS — R7989 Other specified abnormal findings of blood chemistry: Secondary | ICD-10-CM

## 2019-09-28 LAB — COMPREHENSIVE METABOLIC PANEL WITH GFR
ALT: 132 U/L — ABNORMAL HIGH (ref 0–44)
AST: 89 U/L — ABNORMAL HIGH (ref 15–41)
Albumin: 3.2 g/dL — ABNORMAL LOW (ref 3.5–5.0)
Alkaline Phosphatase: 182 U/L — ABNORMAL HIGH (ref 38–126)
Anion gap: 9 (ref 5–15)
BUN: <5 mg/dL — ABNORMAL LOW (ref 6–20)
CO2: 23 mmol/L (ref 22–32)
Calcium: 8.6 mg/dL — ABNORMAL LOW (ref 8.9–10.3)
Chloride: 103 mmol/L (ref 98–111)
Creatinine, Ser: 0.66 mg/dL (ref 0.44–1.00)
GFR calc Af Amer: >60 mL/min (ref >60)
GFR calc non Af Amer: >60 mL/min (ref >60)
Glucose, Bld: 113 mg/dL — ABNORMAL HIGH (ref 70–99)
Potassium: 3.5 mmol/L (ref 3.5–5.1)
Sodium: 135 mmol/L (ref 135–145)
Total Bilirubin: 0.6 mg/dL (ref 0.3–1.2)
Total Protein: 6.6 g/dL (ref 6.5–8.1)

## 2019-09-28 LAB — CBC
HCT: 28.3 % — ABNORMAL LOW (ref 36.0–46.0)
Hemoglobin: 9.6 g/dL — ABNORMAL LOW (ref 12.0–15.0)
MCH: 33.4 pg (ref 26.0–34.0)
MCHC: 33.9 g/dL (ref 30.0–36.0)
MCV: 98.6 fL (ref 80.0–100.0)
Platelets: 76 K/uL — ABNORMAL LOW (ref 150–400)
RBC: 2.87 MIL/uL — ABNORMAL LOW (ref 3.87–5.11)
RDW: 15.2 % (ref 11.5–15.5)
WBC: 8.7 K/uL (ref 4.0–10.5)
nRBC: 0 % (ref 0.0–0.2)

## 2019-09-28 LAB — APTT: aPTT: 96 s — ABNORMAL HIGH (ref 24–36)

## 2019-09-28 LAB — HEPARIN LEVEL (UNFRACTIONATED): Heparin Unfractionated: 0.23 IU/mL — ABNORMAL LOW (ref 0.30–0.70)

## 2019-09-28 MED ORDER — RIVAROXABAN 20 MG PO TABS
20.0000 mg | ORAL_TABLET | Freq: Every day | ORAL | Status: DC
  Administered 2019-09-28 – 2019-09-29 (×2): 20 mg via ORAL
  Filled 2019-09-28 (×2): qty 1

## 2019-09-28 NOTE — Progress Notes (Signed)
[place] for heparin Indication: Factor V deficiency/clots  Allergies  Allergen Reactions  . Ketamine Anxiety and Other (See Comments)    Syncope episode/confusion    Patient Measurements: Height: 5' 8"  (172.7 cm) Weight: 164 lb (74.4 kg) IBW/kg (Calculated) : 63.9 Heparin Dosing Weight: 74 kg  Vital Signs: Temp: 99 F (37.2 C) (12/12 2035) Temp Source: Oral (12/12 2035) BP: 124/76 (12/12 2035) Pulse Rate: 70 (12/12 2035)  Labs: Recent Labs    09/25/19 0810 09/25/19 0810 09/25/19 1339 09/25/19 1550 09/26/19 0556 09/27/19 0518 09/27/19 1101 09/28/19 0418  HGB 11.5*  --   --   --  9.2* 8.8*  --  9.6*  HCT 33.4*  --   --   --  27.6* 26.6*  --  28.3*  PLT 89*  --   --   --  69* 71*  --  76*  APTT  --    < >  --  26  --  88* 89* 96*  LABPROT 13.3  --   --   --   --   --   --   --   INR 1.0  --   --   --   --   --   --   --   HEPARINUNFRC  --   --   --  0.30  --  0.28*  --  0.23*  CREATININE 0.69  --   --   --  0.79 0.79  --  0.66  TROPONINIHS 13  --  8  --   --   --   --   --    < > = values in this interval not displayed.   Estimated Creatinine Clearance: 83 mL/min (by C-G formula based on SCr of 0.66 mg/dL).  Medical History: Past Medical History:  Diagnosis Date  . Abdominal pain 06/10/2018  . Abnormal cervical Papanicolaou smear 09/18/2017  . Anxiety   . Aortic atherosclerosis ([place])   . Arthritis    neck and knees  . 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 Blood transfusion without reported diagnosis   . Cervical cancer ([place]) 09/2016   mets lung  . Chronic anal fissure   . Chronic diarrhea   . Dyspnea   . Erosive gastropathy 09/18/2017  . Factor V Leiden mutation ([place])   . Fecal incontinence   . Genital warts   . GERD (gastroesophageal reflux disease)   . GI bleed 10/08/2018  . Heart murmur   . Hematochezia   . Hemorrhoids   . 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  Hepatitis, chronic ([place]) 05/05/2017  . History of cancer chemotherapy    completed 06/2017  . History of Clostridium difficile infection    while undergoing chemo.  Negative test 10/2017  . Ileocolic anastomotic leak   . 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 Intestinal infection due to Clostridium difficile 09/18/2017  . Macrocytic anemia with vitamin B12 deficiency   . Multiple gastric ulcers   . Nausea vomiting and diarrhea   . Pancolitis ([place]) 07/27/2018  . Perianal condylomata   . Pneumonia    History of  . Pulmonary nodules   . Rectal bleeding   . Small bowel obstruction ([place]) 08/2017  . Stiff neck    limited right turn  . Vitamin D deficiency     Assessment: 53  year old female with h/o Factor V deficiency and clots on Xarelto PTA. Patient takes 20 mg daily. Pharmacy consulted for heparin drip. Xarelto on hold. Last dose of Xarelto reported 12/10 am.  Ordered heparin 4000 unit bolus followed by heparin drip at 1100 units/hr to start 12/11 at 0930 (approximately 24 hours since last Xarelto dose)  1211 1546 APTT 60 seconds (subtherapeutic) 1211 2302 APTT 67 seconds, therapeutic barely at goal (heparin still infusing at 1100 units/hr) - heparin increased to 1200 units/hr   Goal of Therapy:  Heparin level 0.3-0.7 units/ml aPTT 66-102 seconds Monitor platelets by anticoagulation protocol: Yes   Plan:  12/12 1101 APTT 89 seconds, therapeutic x 2. Continue heparin at 1200 units/hr. APTT/HL/CBC with morning labs. 12/13 0418 APTT 96 seconds, therapeutic x 3 (HL 0.23), heparin infusing at 1200 units/hr.  CBC is improved.  Use aPTT to manage heparin infusion, continue current rate and f/u labs in am  CBC daily while on heparin drip.  Ena Dawley, PharmD Clinical Pharmacist 09/28/2019 5:45 AM

## 2019-09-28 NOTE — Progress Notes (Signed)
Patient ID: Joanna Hall, female   DOB: [birth year], 53 y.o.   MRN: [id number] [facility] PROGRESS NOTE  Joanna Hall HDQ:[id number] DOB: [birth year] DOA: 09/25/2019 PCP: Delsa Grana, PA-C  HPI/Subjective: Patient still not feeling any better.  Still with abdominal pain.  Still with nausea.  Vomited last night.  Still having episodes of diarrhea.  Objective: Vitals:   09/28/19 0606 09/28/19 1240  BP: 138/81 (!) 136/91  Pulse: 72 77  Resp: 20 16  Temp: 98.5 F (36.9 C) 98.2 F (36.8 C)  SpO2: 99% 99%    Intake/Output Summary (Last 24 hours) at 09/28/2019 1253 Last data filed at 09/28/2019 0640 Gross per 24 hour  Intake 1809.69 ml  Output 100 ml  Net 1709.69 ml   Filed Weights   09/25/19 0810  Weight: 74.4 kg    ROS: Review of Systems  Constitutional: Negative for chills and fever.  Eyes: Negative for blurred vision.  Respiratory: Negative for cough and shortness of breath.   Cardiovascular: Negative for chest pain.  Gastrointestinal: Positive for abdominal pain, diarrhea and nausea. Negative for constipation and vomiting.  Genitourinary: Negative for dysuria.  Musculoskeletal: Negative for joint pain.  Neurological: Negative for dizziness and headaches.   Exam: Physical Exam  Constitutional: She is oriented to person, place, and time.  HENT:  Nose: No mucosal edema.  Mouth/Throat: No oropharyngeal exudate or posterior oropharyngeal edema.  Eyes: Pupils are equal, round, and reactive to light. Conjunctivae, EOM and lids are normal.  Neck: No JVD present. Carotid bruit is not present. No thyroid mass and no thyromegaly present.  Cardiovascular: S1 normal and S2 normal. Exam reveals no gallop.  No murmur heard. Pulses:      Dorsalis pedis pulses are 2+ on the right side and 2+ on the left side.  Respiratory: No respiratory distress. She has no decreased breath sounds. She has no wheezes. She has no rhonchi. She has no rales.  GI: Soft. Bowel  sounds are normal. There is generalized abdominal tenderness.  Musculoskeletal:     Cervical back: No edema.     Right ankle: No swelling.     Left ankle: No swelling.  Lymphadenopathy:    She has no cervical adenopathy.  Neurological: She is alert and oriented to person, place, and time. No cranial nerve deficit.  Skin: Skin is warm. No rash noted. Nails show no clubbing.  Psychiatric: She has a normal mood and affect.      Data Reviewed: Basic Metabolic Panel: Recent Labs  Lab 09/22/19 1638 09/25/19 0810 09/26/19 0556 09/27/19 0518 09/28/19 0418  NA 133* 134* 132* 135 135  K 3.7 3.6 3.4* 3.5 3.5  CL 97* 101 101 106 103  CO2 21* 22 25 22 23   GLUCOSE 156* 108* 159* 104* 113*  BUN 18 14 8  <5* <5*  CREATININE 0.59 0.69 0.79 0.79 0.66  CALCIUM 9.7 9.3 8.5* 8.5* 8.6*  MG  --   --  1.5* 1.7  --    Liver Function Tests: Recent Labs  Lab 09/22/19 1638 09/25/19 1338 09/28/19 0418  AST 119* 83* 89*  ALT 191* 134* 132*  ALKPHOS 213* 180* 182*  BILITOT 1.1 0.6 0.6  PROT 8.3* 7.4 6.6  ALBUMIN 4.1 3.5 3.2*   Recent Labs  Lab 09/22/19 1638 09/25/19 1339  LIPASE 28 23   CBC: Recent Labs  Lab 09/22/19 1638 09/25/19 0810 09/26/19 0556 09/27/19 0518 09/28/19 0418  WBC 16.3* 9.1 13.4* 12.1* 8.7  NEUTROABS 14.0*  --   --   --   --  HGB 13.6 11.5* 9.2* 8.8* 9.6*  HCT 39.7 33.4* 27.6* 26.6* 28.3*  MCV 97.5 97.4 101.5* 101.9* 98.6  PLT 215 89* 69* 71* 76*   BNP (last 3 results) Recent Labs    05/23/19 1219  BNP 23.0      Recent Results (from the past 240 hour(s))  SARS CORONAVIRUS 2 (TAT 6-24 HRS) Nasopharyngeal Nasopharyngeal Swab     Status: None   Collection Time: 09/25/19  3:26 PM   Specimen: Nasopharyngeal Swab  Result Value Ref Range Status   SARS Coronavirus 2 NEGATIVE NEGATIVE Final    Comment: (NOTE) SARS-CoV-2 target nucleic acids are NOT DETECTED. The SARS-CoV-2 RNA is generally detectable in upper and lower respiratory specimens during the  acute phase of infection. Negative results do not preclude SARS-CoV-2 infection, do not rule out co-infections with other pathogens, and should not be used as the sole basis for treatment or other patient management decisions. Negative results must be combined with clinical observations, patient history, and epidemiological information. The expected result is Negative. Fact Sheet for Patients: SugarRoll.be Fact Sheet for Healthcare Providers: https://www.woods-mathews.com/ This test is not yet approved or cleared by the [place] FDA and  has been authorized for detection and/or diagnosis of SARS-CoV-2 by FDA under an Emergency Use Authorization (EUA). This EUA will remain  in effect (meaning this test can be used) for the duration of the COVID-19 declaration under Section 56 4(b)(1) of the Act, 21 U.S.C. section 360bbb-3(b)(1), unless the authorization is terminated or revoked sooner. Performed at [facility], [place] [address] [place], [place] [address]   C difficile quick scan w PCR reflex     Status: None   Collection Time: 09/26/19 12:46 AM   Specimen: STOOL  Result Value Ref Range Status   C Diff antigen NEGATIVE NEGATIVE Final   C Diff toxin NEGATIVE NEGATIVE Final   C Diff interpretation No C. difficile detected.  Final    Comment: Performed at [facility], [address] [place], [place] [address]      Scheduled Meds: . amitriptyline  75 mg Oral QHS  . Chlorhexidine Gluconate Cloth  6 each Topical Daily  . cholestyramine  4 g Oral BID  . folic acid  1 mg Oral Daily  . ondansetron (ZOFRAN) IV  4 mg Intravenous Q6H  . pantoprazole (PROTONIX) IV  40 mg Intravenous Q12H  . rivaroxaban  20 mg Oral Daily  . sertraline  25 mg Oral Daily   Continuous Infusions: . 0.9 % NaCl with KCl 20 mEq / L 50 mL/hr at 09/28/19 0615  . ciprofloxacin Stopped (09/28/19 0438)  . metronidazole 100 mL/hr at 09/28/19 8413     Assessment/Plan:  1. Acute colitis with nausea vomiting diarrhea and abdominal pain.  Likely due to chemotherapy and/or prior radiation.  Stool for C. difficile negative.  Stool comprehensive panel still pending.  Empiric antibiotics. Appreciate GI consultation.  Standing dose Zofran.  IV fluid hydration with potassium.  Advance to to solid food.  Trial of cholestyramine.  Pending ova and parasites and cryptosporidium and isospora. 2. 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 Thrombocytopenia and anemia secondary to chemotherapy.  The patient will have discussion with Dr. Rogue Bussing prior to next chemotherapy. 3. Anxiety depression on Zoloft and Elavil 4. 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  Stop heparin drip and restart Xarelto. 5. Elevated liver function test.  Code Status:     Code Status Orders  (From admission, onward)  Start     Ordered   09/25/19 1501  Do not attempt resuscitation (DNR)  Continuous    Question Answer Comment  In the event of cardiac or respiratory ARREST Do not call a "code blue"   In the event of cardiac or respiratory ARREST Do not perform Intubation, CPR, defibrillation or ACLS   In the event of cardiac or respiratory ARREST Use medication by any route, position, wound care, and other measures to relive pain and suffering. May use oxygen, suction and manual treatment of airway obstruction as needed for comfort.   Comments Nurse may pronounce      09/25/19 1501        Code Status History    Date Active Date Inactive Code Status Order ID Comments User Context   08/14/2019 1033 08/19/2019 1510 DNR [id number]  Irean Hong, NP Inpatient   08/13/2019 1443 08/14/2019 1033 Full Code [id number]  Demetrios Loll, MD ED   07/22/2019 1658 07/24/2019 1436 Full Code [id number]  Henreitta Leber, MD Inpatient   06/21/2019 1305 06/25/2019 2037 Full Code [id number]  Lang Snow, NP ED   06/14/2019 0418 06/15/2019 1902 Full Code [id number]  Mansy,  Arvella Merles, MD ED   06/07/2019 1536 06/10/2019 2044 Full Code [id number]  Mayo, Pete Pelt, MD Inpatient   05/19/2019 0313 05/22/2019 1545 Full Code [id number]  Mayer Camel, NP Inpatient   11/19/2018 1803 11/26/2018 2238 Full Code [id number]  Nicholes Mango, MD Inpatient   10/17/2018 1910 10/18/2018 2119 Full Code [id number]  Hillary Bow, MD Inpatient   10/08/2018 1443 10/11/2018 1920 Full Code [id number]  Nicholes Mango, MD Inpatient   07/27/2018 1621 08/01/2018 1432 Full Code [id number]  Demetrios Loll, MD Inpatient   06/10/2018 1754 06/12/2018 1614 Full Code [id number]  Dustin Flock, MD Inpatient   04/24/2018 1451 04/27/2018 1700 Full Code [id number]  Lovell Sheehan, MD Inpatient   12/03/2017 1619 12/08/2017 1606 Full Code [id number]  Epifanio Lesches, MD ED   09/12/2017 0024 09/15/2017 1721 Full Code [id number]  [name], MD ED   08/27/2017 0223 09/05/2017 1355 Full Code [id number]  Herbert Pun, MD Inpatient   Advance Care Planning Activity    Advance Directive Documentation     Most Recent Value  Type of Advance Directive  Out of facility DNR (pink MOST or yellow form)  Pre-existing out of facility DNR order (yellow form or pink MOST form)  --  "MOST" Form in Place?  --     Disposition Plan: Once patient feels better will send home. Evaluate on a day-to-day basis.  Consultants:  Gastroenterology  Hematology  Time spent: 26 minutes  [place]

## 2019-09-29 ENCOUNTER — Telehealth: Payer: Self-pay

## 2019-09-29 LAB — CBC
HCT: 29.7 % — ABNORMAL LOW (ref 36.0–46.0)
Hemoglobin: 9.8 g/dL — ABNORMAL LOW (ref 12.0–15.0)
MCH: 33.4 pg (ref 26.0–34.0)
MCHC: 33 g/dL (ref 30.0–36.0)
MCV: 101.4 fL — ABNORMAL HIGH (ref 80.0–100.0)
Platelets: 95 10*3/uL — ABNORMAL LOW (ref 150–400)
RBC: 2.93 MIL/uL — ABNORMAL LOW (ref 3.87–5.11)
RDW: 15.2 % (ref 11.5–15.5)
WBC: 11.4 10*3/uL — ABNORMAL HIGH (ref 4.0–10.5)
nRBC: 0 % (ref 0.0–0.2)

## 2019-09-29 MED ORDER — CIPROFLOXACIN HCL 500 MG PO TABS
500.0000 mg | ORAL_TABLET | Freq: Two times a day (BID) | ORAL | Status: DC
  Administered 2019-09-29: 500 mg via ORAL
  Filled 2019-09-29: qty 1

## 2019-09-29 MED ORDER — HYOSCYAMINE SULFATE ER 0.375 MG PO TB12: 0.3750 mg | ORAL_TABLET | Freq: Two times a day (BID) | ORAL | Status: DC | PRN

## 2019-09-29 MED ORDER — SERTRALINE HCL 25 MG PO TABS: 25.0000 mg | ORAL_TABLET | Freq: Every day | ORAL | 0 refills | Status: DC

## 2019-09-29 MED ORDER — CHOLESTYRAMINE 4 G PO PACK: 4.0000 g | PACK | Freq: Two times a day (BID) | ORAL | 12 refills | Status: DC

## 2019-09-29 MED ORDER — METRONIDAZOLE 250 MG PO TABS: 250.0000 mg | ORAL_TABLET | Freq: Three times a day (TID) | ORAL | 0 refills | Status: AC

## 2019-09-29 MED ORDER — DIPHENOXYLATE-ATROPINE 2.5-0.025 MG PO TABS: 2.0000 | ORAL_TABLET | Freq: Two times a day (BID) | ORAL | Status: DC | PRN

## 2019-09-29 MED ORDER — MORPHINE SULFATE (PF) 4 MG/ML IV SOLN
2.0000 mg | INTRAVENOUS | Status: DC | PRN
  Filled 2019-09-29: qty 1

## 2019-09-29 MED ORDER — RIVAROXABAN 20 MG PO TABS: 20.0000 mg | ORAL_TABLET | Freq: Every morning | ORAL | Status: DC

## 2019-09-29 MED ORDER — CIPROFLOXACIN HCL 500 MG PO TABS: 500.0000 mg | ORAL_TABLET | Freq: Two times a day (BID) | ORAL | 0 refills | Status: DC

## 2019-09-29 NOTE — Telephone Encounter (Signed)
I have made the 1st attempt to contact the patient or family member in charge, in order to follow up from recently being discharged from the hospital. I left a message on voicemail but I will make another attempt at a different time.   Direct number is [phone]

## 2019-09-29 NOTE — Progress Notes (Signed)
Joanna Hall to be D/C'd Home per MD order.  Discussed prescriptions and follow up appointments with the patient. Prescriptions given to patient, medication list explained in detail. Pt verbalized understanding.  Allergies as of 09/29/2019      Reactions   Ketamine Anxiety, Other (See Comments)   Syncope episode/confusion      Medication List    STOP taking these medications   amoxicillin-clavulanate 875-125 MG tablet Commonly known as: Augmentin   meloxicam 15 MG tablet Commonly known as: MOBIC     TAKE these medications   amitriptyline 75 MG tablet Commonly known as: ELAVIL Take 1 tablet (75 mg total) by mouth at bedtime.   Calcium 500 +D 500-400 MG-UNIT Tabs Generic drug: Calcium Carb-Cholecalciferol Take 2 tablets by mouth daily.   cholestyramine 4 g packet Commonly known as: QUESTRAN Take 1 packet (4 g total) by mouth 2 (two) times daily.   ciprofloxacin 500 MG tablet Commonly known as: CIPRO Take 1 tablet (500 mg total) by mouth 2 (two) times daily.   diphenoxylate-atropine 2.5-0.025 MG tablet Commonly known as: LOMOTIL Take 2 tablets by mouth 2 (two) times daily as needed for diarrhea or loose stools. What changed: See the new instructions.   famotidine 20 MG tablet Commonly known as: PEPCID Take 1 tablet (20 mg total) by mouth 2 (two) times daily for 5 days. What changed:   when to take this  reasons to take this   folic acid 1 MG tablet Commonly known as: FOLVITE Take 1 tablet (1 mg total) by mouth daily.   hyoscyamine 0.375 MG 12 hr tablet Commonly known as: LEVBID Take 1 tablet (0.375 mg total) by mouth 2 (two) times daily as needed for cramping. What changed: See the new instructions.   metroNIDAZOLE 250 MG tablet Commonly known as: Flagyl Take 1 tablet (250 mg total) by mouth 3 (three) times daily for 5 doses.   Oxycodone HCl 10 MG Tabs Take 1-2 tablets (10-20 mg total) by mouth every 6 (six) hours as needed (for breakthrough pain).    pantoprazole 40 MG tablet Commonly known as: PROTONIX Take 1 tablet (40 mg total) by mouth daily.   potassium chloride SA 20 MEQ tablet Commonly known as: KLOR-CON Take 1 tablet (20 mEq total) by mouth 2 (two) times daily.   promethazine 25 MG tablet Commonly known as: PHENERGAN Take 1 tablet (25 mg total) by mouth every 8 (eight) hours as needed for nausea or vomiting.   rivaroxaban 20 MG Tabs tablet Commonly known as: XARELTO Take 1 tablet (20 mg total) by mouth every morning. Takes in the morning What changed:   when to take this  additional instructions   sertraline 25 MG tablet Commonly known as: ZOLOFT Take 1 tablet (25 mg total) by mouth daily.   traMADol 50 MG tablet Commonly known as: ULTRAM Take 50 mg by mouth every 6 (six) hours as needed.   Ventolin HFA 108 (90 Base) MCG/ACT inhaler Generic drug: albuterol Inhale 1-2 puffs into the lungs every 4 (four) hours as needed for shortness of breath.       Vitals:   09/28/19 2011 09/29/19 0550  BP: 121/78 131/82  Pulse: 79 84  Resp: 20 20  Temp: 99.7 F (37.6 C) 98.5 F (36.9 C)  SpO2: 100% 99%    Skin clean, dry and intact without evidence of skin break down, no evidence of skin tears noted. IV catheter discontinued intact. Site without signs and symptoms of complications. Dressing and pressure applied.  Pt denies pain at this time. No complaints noted.  An After Visit Summary was printed and given to the patient. Patient escorted via [place], and D/C home via private auto.  Fuller Mandril, RN

## 2019-09-29 NOTE — Discharge Summary (Signed)
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 Abdominal pain 06/10/2018  . Abnormal cervical Papanicolaou smear 09/18/2017  . Anxiety   . Aortic atherosclerosis ([place])   . Arthritis    neck and knees  . 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 Blood transfusion without reported diagnosis   . Cervical cancer ([place]) 09/2016   mets lung  . Chronic anal fissure   . Chronic diarrhea   . Dyspnea   . Erosive gastropathy 09/18/2017  . Factor V Leiden mutation ([place])   . Fecal incontinence   . Genital warts   . GERD (gastroesophageal reflux disease)   . GI bleed 10/08/2018  . Heart murmur   . Hematochezia   . Hemorrhoids   . 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 Hepatitis, chronic ([place]) 05/05/2017  . History of cancer chemotherapy    completed 06/2017  . History of Clostridium difficile infection    while undergoing chemo.  Negative test 10/2017  . Ileocolic anastomotic leak   . 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 Intestinal infection due to Clostridium difficile 09/18/2017  . Macrocytic anemia with vitamin B12 deficiency   . Multiple gastric ulcers   . Nausea vomiting and diarrhea   . Pancolitis ([place]) 07/27/2018  . Perianal condylomata   . Pneumonia    History of  . Pulmonary nodules   . Rectal bleeding   .  Small bowel obstruction ([place]) 08/2017  . Stiff neck    limited right turn  . Vitamin D deficiency     HOSPITAL COURSE:   1.  Acute colitis with nausea, vomiting, diarrhea and abdominal pain.  This is likely secondary to chemotherapy and/or prior radiation.  Stool for C. difficile negative.  Stool for ova and parasites and GI panel still pending.  I also sent off Isospora and Cryptosporidium.  Which is also still pending.  Stool is guaiac negative.  Patient will be given a total of 5 days of Cipro and Flagyl.  The patient has had numerous hospitalizations for this in the past.  Trial of cholestyramine. 2.  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  Patient also has thrombocytopenia and anemia secondary to chemotherapy.  The patient will follow up with Dr. Rogue Bussing as outpatient.  She will discuss further treatment options versus hospice. 3.  Anxiety depression on Zoloft and Elavil 4.  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  Patient was on heparin drip just in case the patient needed a GI procedure.  We started back Xarelto and she will be continued on this. 5.  Elevated liver function test.  DISCHARGE CONDITIONS:   Fair  CONSULTS OBTAINED:  Treatment Team:  Jonathon Bellows, MD  DRUG ALLERGIES:   Allergies  Allergen Reactions  . Ketamine Anxiety and Other (See Comments)    Syncope episode/confusion     DISCHARGE MEDICATIONS:   Allergies as of 09/29/2019      Reactions   Ketamine Anxiety, Other (See Comments)   Syncope episode/confusion      Medication List    STOP taking these medications   amoxicillin-clavulanate 875-125 MG tablet Commonly known as: Augmentin   meloxicam 15 MG tablet Commonly known as: MOBIC     TAKE these medications   amitriptyline 75 MG tablet Commonly known as: ELAVIL Take 1 tablet (75 mg total) by mouth at bedtime.   Calcium 500 +D 500-400 MG-UNIT Tabs Generic drug: Calcium Carb-Cholecalciferol Take 2 tablets by mouth daily.    cholestyramine 4 g packet Commonly known as: QUESTRAN Take 1 packet (4 g total) by mouth 2 (two) times daily.   ciprofloxacin 500 MG tablet Commonly known as: CIPRO Take 1 tablet (500 mg total) by mouth 2 (two) times daily.   diphenoxylate-atropine 2.5-0.025 MG tablet Commonly known as: LOMOTIL Take 2 tablets by mouth 2 (two) times daily as needed for diarrhea or loose stools. What changed: See the new instructions.   famotidine 20 MG tablet Commonly known as: PEPCID Take 1 tablet (20 mg total) by mouth 2 (two) times daily for 5 days. What changed:   when to take this  reasons to take this   folic acid 1 MG tablet Commonly known as: FOLVITE Take 1 tablet (1 mg total) by mouth daily.   hyoscyamine 0.375 MG 12 hr tablet Commonly known as: LEVBID Take 1 tablet (0.375 mg total) by mouth 2 (two) times daily as needed for cramping. What changed: See the new instructions.   metroNIDAZOLE 250 MG tablet Commonly known as: Flagyl Take 1 tablet (250 mg total) by mouth 3 (three) times daily for 5 doses.   Oxycodone HCl 10 MG Tabs Take 1-2 tablets (10-20 mg total) by mouth every 6 (six) hours as needed (for breakthrough pain).   pantoprazole 40 MG tablet Commonly known as: PROTONIX Take 1 tablet (40 mg total) by mouth daily.   potassium chloride SA 20 MEQ tablet Commonly known as: KLOR-CON Take 1 tablet (20 mEq total) by mouth 2 (two) times daily.   promethazine 25 MG tablet Commonly known as: PHENERGAN Take 1 tablet (25 mg total) by mouth every 8 (eight) hours as needed for nausea or vomiting.   rivaroxaban 20 MG Tabs tablet Commonly known as: XARELTO Take 1 tablet (20 mg total) by mouth every morning. Takes in the morning What changed:   when to take this  additional instructions   sertraline 25 MG tablet Commonly known as: ZOLOFT Take 1 tablet (25 mg total) by mouth daily.   traMADol 50 MG tablet Commonly known as: ULTRAM Take 50 mg by mouth every 6 (six)  hours as needed.   Ventolin HFA 108 (90 Base) MCG/ACT inhaler Generic drug: albuterol Inhale 1-2 puffs into the lungs every 4 (four) hours as needed for shortness of breath.        DISCHARGE INSTRUCTIONS:   Follow-up PMD 5 days Follow-up Dr. Rogue Bussing oncology as already scheduled.  If you experience worsening of your admission symptoms, develop shortness of breath, life threatening emergency, suicidal or homicidal thoughts you must seek medical attention immediately by calling 911 or calling your MD immediately  if symptoms less severe.  You Must read complete instructions/literature along with all the possible adverse reactions/side effects for all the Medicines you take and that  have been prescribed to you. Take any new Medicines after you have completely understood and accept all the possible adverse reactions/side effects.   Please note  You were cared for by a hospitalist during your hospital stay. If you have any questions about your discharge medications or the care you received while you were in the hospital after you are discharged, you can call the unit and asked to speak with the hospitalist on call if the hospitalist that took care of you is not available. Once you are discharged, your primary care physician will handle any further medical issues. Please note that NO REFILLS for any discharge medications will be authorized once you are discharged, as it is imperative that you return to your primary care physician (or establish a relationship with a primary care physician if you do not have one) for your aftercare needs so that they can reassess your need for medications and monitor your lab values.    Today   CHIEF COMPLAINT:   Chief Complaint  Patient presents with  . Chest Pain  . Diarrhea  . Nausea    HISTORY OF PRESENT ILLNESS:  Joanna Hall  is a 53 y.o. female came in with nausea vomiting diarrhea and abdominal pain.  Acute colitis was seen on CT scan a  few days prior to admission.   VITAL SIGNS:  Blood pressure 131/82, pulse 84, temperature 98.5 F (36.9 C), temperature source Oral, resp. rate 20, height 5' 8"  (1.727 m), weight 74.4 kg, SpO2 99 %.  I/O:    Intake/Output Summary (Last 24 hours) at 09/29/2019 1453 Last data filed at 09/29/2019 8119 Gross per 24 hour  Intake 2381.7 ml  Output -  Net 2381.7 ml    PHYSICAL EXAMINATION:  GENERAL:  53 y.o.-year-old patient lying in the bed with no acute distress.  EYES: Pupils equal, round, reactive to light and accommodation. No scleral icterus. Extraocular muscles intact.  HEENT: Head atraumatic, normocephalic. Oropharynx and nasopharynx clear.  NECK:  Supple, no jugular venous distention. No thyroid enlargement, no tenderness.  LUNGS: Normal breath sounds bilaterally, no wheezing, rales,rhonchi or crepitation. No use of accessory muscles of respiration.  CARDIOVASCULAR: S1, S2 normal. No murmurs, rubs, or gallops.  ABDOMEN: Soft, tender in lower abdomen.  Non-distended. Bowel sounds present. No organomegaly or mass.  EXTREMITIES: No pedal edema, cyanosis, or clubbing.  NEUROLOGIC: Cranial nerves II through XII are intact. Muscle strength 5/5 in all extremities. Sensation intact. Gait not checked.  PSYCHIATRIC: The patient is alert and oriented x 3.  SKIN: No obvious rash, lesion, or ulcer.   DATA REVIEW:   CBC Recent Labs  Lab 09/29/19 0334  WBC 11.4*  HGB 9.8*  HCT 29.7*  PLT 95*    Chemistries  Recent Labs  Lab 09/27/19 0518 09/28/19 0418  NA 135 135  K 3.5 3.5  CL 106 103  CO2 22 23  GLUCOSE 104* 113*  BUN <5* <5*  CREATININE 0.79 0.66  CALCIUM 8.5* 8.6*  MG 1.7  --   AST  --  89*  ALT  --  132*  ALKPHOS  --  182*  BILITOT  --  0.6    Microbiology Results  Results for orders placed or performed during the hospital encounter of 09/25/19  SARS CORONAVIRUS 2 (TAT 6-24 HRS) Nasopharyngeal Nasopharyngeal Swab     Status: None   Collection Time: 09/25/19   3:26 PM   Specimen: Nasopharyngeal Swab  Result Value Ref Range Status   SARS Coronavirus 2  NEGATIVE NEGATIVE Final    Comment: (NOTE) SARS-CoV-2 target nucleic acids are NOT DETECTED. The SARS-CoV-2 RNA is generally detectable in upper and lower respiratory specimens during the acute phase of infection. Negative results do not preclude SARS-CoV-2 infection, do not rule out co-infections with other pathogens, and should not be used as the sole basis for treatment or other patient management decisions. Negative results must be combined with clinical observations, patient history, and epidemiological information. The expected result is Negative. Fact Sheet for Patients: SugarRoll.be Fact Sheet for Healthcare Providers: https://www.woods-mathews.com/ This test is not yet approved or cleared by the [place] FDA and  has been authorized for detection and/or diagnosis of SARS-CoV-2 by FDA under an Emergency Use Authorization (EUA). This EUA will remain  in effect (meaning this test can be used) for the duration of the COVID-19 declaration under Section 56 4(b)(1) of the Act, 21 U.S.C. section 360bbb-3(b)(1), unless the authorization is terminated or revoked sooner. Performed at [facility], [place] [address], [place], [place] [address]   C difficile quick scan w PCR reflex     Status: None   Collection Time: 09/26/19 12:46 AM   Specimen: STOOL  Result Value Ref Range Status   C Diff antigen NEGATIVE NEGATIVE Final   C Diff toxin NEGATIVE NEGATIVE Final   C Diff interpretation No C. difficile detected.  Final    Comment: Performed at [facility], [address] [place], [place] [address]   *Note: Due to a large number of results and/or encounters for the requested time period, some results have not been displayed. A complete set of results can be found in Results Review.     Management plans discussed with the patient, and  she is in agreement.  CODE STATUS:     Code Status Orders  (From admission, onward)         Start     Ordered   09/25/19 1501  Do not attempt resuscitation (DNR)  Continuous    Question Answer Comment  In the event of cardiac or respiratory ARREST Do not call a "code blue"   In the event of cardiac or respiratory ARREST Do not perform Intubation, CPR, defibrillation or ACLS   In the event of cardiac or respiratory ARREST Use medication by any route, position, wound care, and other measures to relive pain and suffering. May use oxygen, suction and manual treatment of airway obstruction as needed for comfort.   Comments Nurse may pronounce      09/25/19 1501        Code Status History    Date Active Date Inactive Code Status Order ID Comments User Context   08/14/2019 1033 08/19/2019 1510 DNR [id number]  Irean Hong, NP Inpatient   08/13/2019 1443 08/14/2019 1033 Full Code [id number]  Demetrios Loll, MD ED   07/22/2019 1658 07/24/2019 1436 Full Code [id number]  Henreitta Leber, MD Inpatient   06/21/2019 1305 06/25/2019 2037 Full Code [id number]  Lang Snow, NP ED   06/14/2019 0418 06/15/2019 1902 Full Code [id number]  Mansy, Arvella Merles, MD ED   06/07/2019 1536 06/10/2019 2044 Full Code [id number]  Mayo, Pete Pelt, MD Inpatient   05/19/2019 0313 05/22/2019 1545 Full Code [id number]  Mayer Camel, NP Inpatient   11/19/2018 1803 11/26/2018 2238 Full Code [id number]  Nicholes Mango, MD Inpatient   10/17/2018 1910 10/18/2018 2119 Full Code [id number]  Hillary Bow, MD Inpatient   10/08/2018 1443 10/11/2018 1920 Full Code [id number]  Gouru,  Illene Silver, MD Inpatient   07/27/2018 1621 08/01/2018 1432 Full Code [id number]  Demetrios Loll, MD Inpatient   06/10/2018 1754 06/12/2018 1614 Full Code [id number]  Dustin Flock, MD Inpatient   04/24/2018 1451 04/27/2018 1700 Full Code [id number]  Lovell Sheehan, MD Inpatient   12/03/2017 1619 12/08/2017 1606 Full Code [id number]  Epifanio Lesches, MD ED   09/12/2017 0024  09/15/2017 1721 Full Code [id number]  [name], MD ED   08/27/2017 0223 09/05/2017 1355 Full Code [id number]  Herbert Pun, MD Inpatient   Advance Care Planning Activity    Advance Directive Documentation     Most Recent Value  Type of Advance Directive  Out of facility DNR (pink MOST or yellow form)  Pre-existing out of facility DNR order (yellow form or pink MOST form)  -  "MOST" Form in Place?  -      TOTAL TIME TAKING CARE OF THIS PATIENT: 34 minutes.    Loletha Grayer M.D on 09/29/2019 at 2:53 PM  Between 7am to 6pm - Pager - [phone]  After 6pm go to [email or web address] - password [email or web address]  [facility]  CC: Primary care physician; Delsa Grana, PA-C

## 2019-09-30 NOTE — Progress Notes (Signed)
Name: Joanna Hall   MRN: [id number]    DOB: 1966/03/22   Date:09/30/2019       Progress Note  Subjective:    Chief Complaint  No chief complaint on file.   I connected with  Joanna Hall  on 09/30/19 at  2:40 PM EST by a video enabled telemedicine application and verified that I am speaking with the correct person using two identifiers.  I discussed the limitations of evaluation and management by telemedicine and the availability of in person appointments. The patient expressed understanding and agreed to proceed. Staff also discussed with the patient that there may be a patient responsible charge related to this service. Patient Location:  Provider Location:  Additional Individuals present:   HPI  Here for hospital follow up/transition of care.  Admit date: 09/25/2019 Discharge date: 09/29/2019 Transition of care was initiated previously by  on  and med changes, diagnosis, specialist follow ups and pts symptoms and condition were all reviewed.  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  Acute colitis with nausea, vomiting, diarrhea and abdominal pain.  This is likely secondary to chemotherapy and/or prior radiation.  Stool for C. difficile negative.  Stool for ova and parasites and GI panel still pending.  I also sent off Isospora and Cryptosporidium.  Which is also still pending.  Stool is guaiac negative.  Patient will be given a total of 5 days of Cipro and Flagyl.  The patient has had numerous hospitalizations for this in the past.  Trial of cholestyramine. 2.  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  Patient also has thrombocytopenia and anemia secondary to  chemotherapy.  The patient will follow up with Dr. Rogue Bussing as outpatient.  She will discuss further treatment options versus hospice. 3.  Anxiety depression on Zoloft and Elavil 4.  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  Patient was on heparin drip just in case the patient needed a GI procedure.  We started back Xarelto and she will be continued on this. 5.  Elevated liver function test.    New medications started per hospitalization include  Labs due today are Pt feels          Patient Active Problem List   Diagnosis Date Noted  . Acute colitis 09/25/2019  . Anxiety and depression   . Thrombocytopenia ([place]) 08/17/2019  . Intractable lower abdominal pain   . Abdominal pain 07/23/2019  . Constipation 07/22/2019  . Malnutrition of moderate degree 06/22/2019  . Small bowel obstruction due to adhesions ([place]) 06/21/2019  . Acute gastroenteritis 06/14/2019  . Intractable nausea and vomiting 06/07/2019  . Chest pain 05/19/2019  . Intractable pain 05/19/2019  . Cancer associated pain   . Proctitis, radiation   . Palliative care encounter   . Encounter for monitoring opioid maintenance therapy 11/19/2018  . Adrenal insufficiency ([place]) 09/03/2018  . Nausea vomiting and diarrhea   . Chronic diarrhea 06/25/2018  . Chronic anticoagulation 06/25/2018  . Vomiting   . Encounter for antineoplastic chemotherapy 06/04/2018  . 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 Lung metastasis ([place]) 05/15/2018  . Closed fracture of distal end of radius 05/04/2018  .  History of total knee arthroplasty 04/24/2018  . Osteoarthritis of left knee 02/28/2018  . Condyloma acuminatum of anus s/p ablation 02/22/2018 02/22/2018  . Condyloma acuminatum of vagina s/p ablation 02/22/2018 02/22/2018  . Positive ANA (antinuclear antibody) 02/04/2018  . Aortic atherosclerosis ([place]) 01/15/2018  . Elevated MCV 01/15/2018  . Anemia 01/15/2018  . Genital warts 01/15/2018  . Vitamin D deficiency  01/15/2018  . Pernicious anemia   . Impingement syndrome of shoulder region 12/19/2017  . Multiple joint pain 12/19/2017  . Osteoarthritis of right knee 12/19/2017  . B12 deficiency 12/11/2017  . Lung nodules 12/11/2017  . History of Clostridium difficile infection 10/23/2017  . Factor V Leiden ([place]) 10/19/2017  . Goals of care, counseling/discussion 10/19/2017  . Cervical cancer, FIGO stage IB1 ([place]) 09/18/2017  . Chronic hepatitis C without hepatic coma ([place]) 09/18/2017  . Cytopenia 09/18/2017  . Diarrhea 09/18/2017  . Elevated LFTs 09/18/2017  . Lower abdominal pain 09/18/2017  . 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 Malignant neoplasm of overlapping sites of cervix ([place]) 09/18/2017  . Renal insufficiency 09/18/2017  . Wound infection after surgery 09/12/2017  . Hypokalemia   . Hypomagnesemia   . Cervical arthritis 07/18/2017  . Dysuria 06/20/2017  . Metastasis from cervical cancer ([place]) 05/12/2017  . Essential hypertension 03/15/2017  . Anemia of chronic disease 03/01/2017  . Chemotherapy-induced neutropenia ([place]) 01/29/2017  . 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 CHOLECYSTECTOMY    . COLON SURGERY  08/2017   resection  . COLONOSCOPY WITH PROPOFOL N/A 12/20/2017   Procedure: COLONOSCOPY WITH PROPOFOL;  Surgeon: [name], MD;  Location[facility] END[facility]COPY;  Service: Gastroenterology;  Laterality: N/A;  . COLONOSCOPY WITH PROPOFOL N/A 07/30/2018   Procedure: COLONOSCOPY WITH PROPOFOL;  Surgeon: [name], MD;  Location: [facility] ENDOSCOPY;  Service: Gastroenterology;  Laterality: N/A;  . COLONOSCOPY WITH PROPOFOL N/A 10/10/2018   Procedure: COLONOSCOPY WITH PROPOFOL;  Surgeon: Lucilla Lame, MD;  Location: [facility] ENDOSCOPY;  Service: Endoscopy;  Laterality: N/A;  . DIAGNOSTIC LAPAROSCOPY    . ESOPHAGOGASTRODUODENOSCOPY (EGD) WITH PROPOFOL N/A 12/20/2017   Procedure: ESOPHAGOGASTRODUODENOSCOPY (EGD)  WITH PROPOFOL;  Surgeon: [name], MD;  Location: [place];  Service: Gastroenterology;  Laterality: N/A;  . ESOPHAGOGASTRODUODENOSCOPY (EGD) WITH PROPOFOL  07/30/2018   Procedure: ESOPHAGOGASTRODUODENOSCOPY (EGD) WITH PROPOFOL;  Surgeon: [name], MD;  Location: [facility] ENDOSCOPY;  Service: Gastroenterology;;  . Otho Darner SIGMOIDOSCOPY N/A 11/21/2018   Procedure: FLEXIBLE SIGMOIDOSCOPY;  Surgeon: [name], MD;  Location: [facility] ENDOSCOPY;  Service: Gastroenterology;  Laterality: N/A;  . LAPAROTOMY N/A 08/31/2017   Procedure: EXPLORATORY LAPAROTOMY for SBO, ileocolectomy, removal of piece of uterine wall;  Surgeon: [name], MD;  Location: [facility] ORS;  Service: General;  Laterality: N/A;  . LASER ABLATION CONDOLAMATA N/A 02/22/2018   Procedure: LASER ABLATION/REMOVAL OF [id number] AROUND ANUS AND VAGINA;  Surgeon: Michael Boston, MD;  Location: [place];  Service: General;  Laterality: N/A;  . OOPHORECTOMY    . PORTA CATH INSERTION N/A 05/13/2018   Procedure: PORTA CATH INSERTION;  Surgeon: Katha Cabal, MD;  Location: [place] CV LAB;  Service: Cardiovascular;  Laterality: N/A;  . SMALL INTESTINE SURGERY    . TANDEM RING INSERTION     x3  . THORACOTOMY    . TOTAL KNEE ARTHROPLASTY Left 04/24/2018   Procedure: TOTAL KNEE ARTHROPLASTY;  Surgeon: Lovell Sheehan, MD;  Location: [facility] ORS;  Service: Orthopedics;  Laterality:  Left;    Family History  Problem Relation Age of Onset  . Hypertension Mother   . Hypertension Father   . Diabetes Father   . Hyperlipidemia Father   . Alcohol abuse Daughter   . Hypertension Maternal Grandmother   . Diabetes Maternal Grandmother   . Diabetes Paternal Grandmother   . Hypertension Paternal Grandmother     Social History   Socioeconomic History  . Marital status: Divorced    Spouse name: Not on file  . Number of children: Not on file  . Years of education: Not on file  . Highest  education level: Not on file  Occupational History  . Not on file  Tobacco Use  . Smoking status: Former Smoker    Packs/day: 0.25    Years: 10.00    Pack years: 2.50    Types: Cigarettes    Quit date: 10/16/2006    Years since quitting: 12.9  . Smokeless tobacco: Never Used  Substance and Sexual Activity  . Alcohol use: Not Currently    Comment: seldom  . Drug use: Yes    Types: Marijuana    Comment: not very often  . Sexual activity: Not Currently    Birth control/protection: Post-menopausal    Comment: Not Asked  Other Topics Concern  . Not on file  Social History Narrative  . Not on file   Social Determinants of Health   Financial Resource Strain:   . Difficulty of Paying Living Expenses: Not on file  Food Insecurity:   . Worried About Charity fundraiser in the Last Year: Not on file  . Ran Out of Food in the Last Year: Not on file  Transportation Needs:   . Lack of Transportation (Medical): Not on file  . Lack of Transportation (Non-Medical): Not on file  Physical Activity:   . Days of Exercise per Week: Not on file  . Minutes of Exercise per Session: Not on file  Stress:   . Feeling of Stress : Not on file  Social Connections:   . Frequency of Communication with Friends and Family: Not on file  . Frequency of Social Gatherings with Friends and Family: Not on file  . Attends Religious Services: Not on file  . Active Member of Clubs or Organizations: Not on file  . Attends Archivist Meetings: Not on file  . Marital Status: Not on file  Intimate Partner Violence:   . Fear of Current or Ex-Partner: Not on file  . Emotionally Abused: Not on file  . Physically Abused: Not on file  . Sexually Abused: Not on file     Current Outpatient Medications:  .  amitriptyline (ELAVIL) 75 MG tablet, Take 1 tablet (75 mg total) by mouth at bedtime., Disp: 90 tablet, Rfl: 1 .  Calcium Carb-Cholecalciferol (CALCIUM 500 +D) 500-400 MG-UNIT TABS, Take 2 tablets by  mouth daily., Disp: , Rfl:  .  cholestyramine (QUESTRAN) 4 g packet, Take 1 packet (4 g total) by mouth 2 (two) times daily., Disp: 60 each, Rfl: 12 .  ciprofloxacin (CIPRO) 500 MG tablet, Take 1 tablet (500 mg total) by mouth 2 (two) times daily., Disp: 3 tablet, Rfl: 0 .  diphenoxylate-atropine (LOMOTIL) 2.5-0.025 MG tablet, Take 2 tablets by mouth 2 (two) times daily as needed for diarrhea or loose stools., Disp: , Rfl:  .  famotidine (PEPCID) 20 MG tablet, Take 1 tablet (20 mg total) by mouth 2 (two) times daily for 5 days. (Patient taking differently: Take  20 mg by mouth 2 (two) times daily as needed. ), Disp: 10 tablet, Rfl: 0 .  folic acid (FOLVITE) 1 MG tablet, Take 1 tablet (1 mg total) by mouth daily., Disp: 30 tablet, Rfl: 6 .  hyoscyamine (LEVBID) 0.375 MG 12 hr tablet, Take 1 tablet (0.375 mg total) by mouth 2 (two) times daily as needed for cramping., Disp: , Rfl:  .  metroNIDAZOLE (FLAGYL) 250 MG tablet, Take 1 tablet (250 mg total) by mouth 3 (three) times daily for 5 doses., Disp: 5 tablet, Rfl: 0 .  Oxycodone HCl 10 MG TABS, Take 1-2 tablets (10-20 mg total) by mouth every 6 (six) hours as needed (for breakthrough pain)., Disp: 120 tablet, Rfl: 0 .  pantoprazole (PROTONIX) 40 MG tablet, Take 1 tablet (40 mg total) by mouth daily., Disp: 90 tablet, Rfl: 1 .  potassium chloride SA (KLOR-CON) 20 MEQ tablet, Take 1 tablet (20 mEq total) by mouth 2 (two) times daily., Disp: 30 tablet, Rfl: 0 .  promethazine (PHENERGAN) 25 MG tablet, Take 1 tablet (25 mg total) by mouth every 8 (eight) hours as needed for nausea or vomiting., Disp: 30 tablet, Rfl: 6 .  rivaroxaban (XARELTO) 20 MG TABS tablet, Take 1 tablet (20 mg total) by mouth every morning. Takes in the morning, Disp: , Rfl:  .  sertraline (ZOLOFT) 25 MG tablet, Take 1 tablet (25 mg total) by mouth daily., Disp: 30 tablet, Rfl: 0 .  traMADol (ULTRAM) 50 MG tablet, Take 50 mg by mouth every 6 (six) hours as needed., Disp: , Rfl:  .   VENTOLIN HFA 108 (90 Base) MCG/ACT inhaler, Inhale 1-2 puffs into the lungs every 4 (four) hours as needed for shortness of breath., Disp: 1 Inhaler, Rfl: 1 No current facility-administered medications for this visit.  Facility-Administered Medications Ordered in Other Visits:  .  heparin lock flush 100 unit/mL, 500 Units, Intravenous, Once, [place], Melissa C, MD .  sodium chloride 0.9 % 1,000 mL with potassium chloride 20 mEq infusion, , Intravenous, Once, Honor Loh E, NP .  sodium chloride flush (NS) 0.9 % injection 10 mL, 10 mL, Intravenous, Once, Borders, Kirt Boys, NP  Allergies  Allergen Reactions  . Ketamine Anxiety and Other (See Comments)    Syncope episode/confusion     I personally reviewed  with the patient/caregiver today.  Review of Systems    Objective:    Virtual encounter, vitals limited, only able to obtain the following There were no vitals filed for this visit. There is no height or weight on file to calculate BMI. Nursing Note and Vital Signs reviewed.  Physical Exam  PE limited by telephone encounter  Results for orders placed or performed during the hospital encounter of 09/25/19 (from the past 72 hour(s))  CBC     Status: Abnormal   Collection Time: 09/28/19  4:18 AM  Result Value Ref Range   WBC 8.7 4.0 - 10.5 K/uL   RBC 2.87 (L) 3.87 - 5.11 MIL/uL   Hemoglobin 9.6 (L) 12.0 - 15.0 g/dL   HCT 28.3 (L) 36.0 - 46.0 %   MCV 98.6 80.0 - 100.0 fL   MCH 33.4 26.0 - 34.0 pg   MCHC 33.9 30.0 - 36.0 g/dL   RDW 15.2 11.5 - 15.5 %   Platelets 76 (L) 150 - 400 K/uL    Comment: Immature Platelet Fraction may be clinically indicated, consider ordering this additional test [id number]    nRBC 0.0 0.0 - 0.2 %    Comment:  Performed at [facility], [place], [place] [address]  Heparin level (unfractionated)     Status: Abnormal   Collection Time: 09/28/19  4:18 AM  Result Value Ref Range   Heparin Unfractionated 0.23 (L) 0.30 -  0.70 IU/mL    Comment: (NOTE) If heparin results are below expected values, and patient dosage has  been confirmed, suggest follow up testing of antithrombin III levels. Performed at [facility], [address] [place], [place] [address]   APTT     Status: Abnormal   Collection Time: 09/28/19  4:18 AM  Result Value Ref Range   aPTT 96 (H) 24 - 36 seconds    Comment:        IF BASELINE aPTT IS ELEVATED, SUGGEST PATIENT RISK ASSESSMENT BE USED TO DETERMINE APPROPRIATE ANTICOAGULANT THERAPY. Performed at [facility], [address] [place], [place] [address]   Comprehensive metabolic panel     Status: Abnormal   Collection Time: 09/28/19  4:18 AM  Result Value Ref Range   Sodium 135 135 - 145 mmol/L   Potassium 3.5 3.5 - 5.1 mmol/L   Chloride 103 98 - 111 mmol/L   CO2 23 22 - 32 mmol/L   Glucose, Bld 113 (H) 70 - 99 mg/dL   BUN <5 (L) 6 - 20 mg/dL   Creatinine, Ser 0.66 0.44 - 1.00 mg/dL   Calcium 8.6 (L) 8.9 - 10.3 mg/dL   Total Protein 6.6 6.5 - 8.1 g/dL   Albumin 3.2 (L) 3.5 - 5.0 g/dL   AST 89 (H) 15 - 41 U/L   ALT 132 (H) 0 - 44 U/L   Alkaline Phosphatase 182 (H) 38 - 126 U/L   Total Bilirubin 0.6 0.3 - 1.2 mg/dL   GFR calc non Af Amer >60 >60 mL/min   GFR calc Af Amer >60 >60 mL/min   Anion gap 9 5 - 15    Comment: Performed at [facility], [address] [place], [place] [address]  CBC     Status: Abnormal   Collection Time: 09/29/19  3:34 AM  Result Value Ref Range   WBC 11.4 (H) 4.0 - 10.5 K/uL   RBC 2.93 (L) 3.87 - 5.11 MIL/uL   Hemoglobin 9.8 (L) 12.0 - 15.0 g/dL   HCT 29.7 (L) 36.0 - 46.0 %   MCV 101.4 (H) 80.0 - 100.0 fL   MCH 33.4 26.0 - 34.0 pg   MCHC 33.0 30.0 - 36.0 g/dL   RDW 15.2 11.5 - 15.5 %   Platelets 95 (L) 150 - 400 K/uL    Comment: SPECIMEN CHECKED FOR CLOTS Immature Platelet Fraction may be clinically indicated, consider ordering this additional test [id number] CONSISTENT WITH PREVIOUS RESULT    nRBC  0.0 0.0 - 0.2 %    Comment: Performed at [facility], [address] [place], [place] [address]   *Note: Due to a large number of results and/or encounters for the requested time period, some results have not been displayed. A complete set of results can be found in Results Review.    PHQ2/9: Depression screen [facility] 2/9 08/21/2019 05/30/2019  Decreased Interest 0 0  Down, Depressed, Hopeless 1 0  PHQ - 2 Score 1 0  Altered sleeping 3 0  Tired, decreased energy 3 0  Change in appetite 3 0  Feeling bad or failure about yourself  0 0  Trouble concentrating 0 0  Moving slowly or fidgety/restless 0 0  Suicidal thoughts  0 0  PHQ-9 Score 10 0  Difficult doing work/chores Not difficult at all Not difficult at all  Some recent data might be hidden   PHQ-2/9 Result is .    Fall Risk: Fall Risk  08/21/2019 07/10/2019 05/30/2019 11/19/2018 10/22/2018  Falls in the past year? 0 0 0 0 1  Number falls in past yr: 0 - 0 - 0  Injury with Fall? 0 - 0 - 0     Assessment and Plan:   Problem List Items Addressed This Visit    None       I discussed the assessment and treatment plan with the patient. The patient was provided an opportunity to ask questions and all were answered. The patient agreed with the plan and demonstrated an understanding of the instructions.  The patient was advised to call back or seek an in-person evaluation if the symptoms worsen or if the condition fails to improve as anticipated.  I provided  minutes of non-face-to-face time during this encounter.  Delsa Grana, PA-C 12/15/206:34 PM

## 2019-10-01 ENCOUNTER — Other Ambulatory Visit: Payer: Self-pay

## 2019-10-01 ENCOUNTER — Ambulatory Visit (INDEPENDENT_AMBULATORY_CARE_PROVIDER_SITE_OTHER): Payer: Medicaid Other | Admitting: Family Medicine

## 2019-10-01 DIAGNOSIS — Z5329 Procedure and treatment not carried out because of patient's decision for other reasons: Secondary | ICD-10-CM

## 2019-10-01 LAB — GI PATHOGEN PANEL BY PCR, STOOL

## 2019-10-01 NOTE — Progress Notes (Signed)
Unable to reach pt today after multiple attempts She does have f/up with oncology and GI

## 2019-10-02 ENCOUNTER — Encounter: Payer: Self-pay | Admitting: Internal Medicine

## 2019-10-02 ENCOUNTER — Other Ambulatory Visit: Payer: Self-pay | Admitting: *Deleted

## 2019-10-02 LAB — GIARDIA, EIA; OVA/PARASITE: Giardia Ag, Stl: NEGATIVE

## 2019-10-02 LAB — O&P RESULT

## 2019-10-02 LAB — CRYPTOSPORIDIUM/ISOSPORA SMEAR
Cryptosporidium Smear,Stool: NONE SEEN
Isospora Smear, Stool: NONE SEEN

## 2019-10-03 ENCOUNTER — Other Ambulatory Visit: Payer: Self-pay | Admitting: Hospice and Palliative Medicine

## 2019-10-03 MED ORDER — OXYCODONE HCL ER 20 MG PO T12A: 20.0000 mg | EXTENDED_RELEASE_TABLET | Freq: Three times a day (TID) | ORAL | 0 refills | Status: DC

## 2019-10-03 MED ORDER — OXYCODONE HCL 10 MG PO TABS: 10.0000 mg | ORAL_TABLET | Freq: Four times a day (QID) | ORAL | 0 refills | Status: DC | PRN

## 2019-10-03 NOTE — Progress Notes (Signed)
Refill of OxyContin 43m Q8H (#90) and oxycodone IR 117m1-2 tablets Q6H PRN for BTP (#120). PDMP reviewed.

## 2019-10-06 ENCOUNTER — Other Ambulatory Visit: Payer: Self-pay

## 2019-10-06 ENCOUNTER — Ambulatory Visit
Admission: RE | Admit: 2019-10-06 | Discharge: 2019-10-06 | Disposition: A | Discharge status: Home / Self Care | Payer: Medicaid Other | Source: Ambulatory Visit | Attending: Internal Medicine | Admitting: Internal Medicine

## 2019-10-06 DIAGNOSIS — C538 Malignant neoplasm of overlapping sites of cervix uteri: Secondary | ICD-10-CM | POA: Diagnosis present

## 2019-10-06 MED ORDER — IOHEXOL 300 MG/ML  SOLN
75.0000 mL | Freq: Once | INTRAMUSCULAR | Status: AC | PRN
  Administered 2019-10-06: 75 mL via INTRAVENOUS

## 2019-10-08 ENCOUNTER — Inpatient Hospital Stay (HOSPITAL_BASED_OUTPATIENT_CLINIC_OR_DEPARTMENT_OTHER): Payer: Medicaid Other | Admitting: Hospice and Palliative Medicine

## 2019-10-08 ENCOUNTER — Inpatient Hospital Stay: Payer: Medicaid Other

## 2019-10-08 ENCOUNTER — Other Ambulatory Visit: Payer: Self-pay

## 2019-10-08 ENCOUNTER — Encounter: Payer: Self-pay | Admitting: Internal Medicine

## 2019-10-08 ENCOUNTER — Inpatient Hospital Stay (HOSPITAL_BASED_OUTPATIENT_CLINIC_OR_DEPARTMENT_OTHER): Payer: Medicaid Other | Admitting: Internal Medicine

## 2019-10-08 DIAGNOSIS — R7989 Other specified abnormal findings of blood chemistry: Secondary | ICD-10-CM | POA: Diagnosis not present

## 2019-10-08 DIAGNOSIS — G629 Polyneuropathy, unspecified: Secondary | ICD-10-CM | POA: Diagnosis not present

## 2019-10-08 DIAGNOSIS — Z515 Encounter for palliative care: Secondary | ICD-10-CM | POA: Diagnosis not present

## 2019-10-08 DIAGNOSIS — K5900 Constipation, unspecified: Secondary | ICD-10-CM | POA: Diagnosis not present

## 2019-10-08 DIAGNOSIS — R197 Diarrhea, unspecified: Secondary | ICD-10-CM | POA: Diagnosis not present

## 2019-10-08 DIAGNOSIS — G8929 Other chronic pain: Secondary | ICD-10-CM | POA: Diagnosis not present

## 2019-10-08 DIAGNOSIS — R918 Other nonspecific abnormal finding of lung field: Secondary | ICD-10-CM | POA: Diagnosis not present

## 2019-10-08 DIAGNOSIS — Z7901 Long term (current) use of anticoagulants: Secondary | ICD-10-CM | POA: Diagnosis not present

## 2019-10-08 DIAGNOSIS — M109 Gout, unspecified: Secondary | ICD-10-CM | POA: Diagnosis not present

## 2019-10-08 DIAGNOSIS — C538 Malignant neoplasm of overlapping sites of cervix uteri: Secondary | ICD-10-CM | POA: Diagnosis not present

## 2019-10-08 DIAGNOSIS — C7800 Secondary malignant neoplasm of unspecified lung: Secondary | ICD-10-CM | POA: Diagnosis not present

## 2019-10-08 DIAGNOSIS — Z5111 Encounter for antineoplastic chemotherapy: Secondary | ICD-10-CM | POA: Diagnosis present

## 2019-10-08 DIAGNOSIS — Z86711 Personal history of pulmonary embolism: Secondary | ICD-10-CM | POA: Diagnosis not present

## 2019-10-08 DIAGNOSIS — C539 Malignant neoplasm of cervix uteri, unspecified: Secondary | ICD-10-CM | POA: Diagnosis not present

## 2019-10-08 DIAGNOSIS — Z79899 Other long term (current) drug therapy: Secondary | ICD-10-CM | POA: Diagnosis not present

## 2019-10-08 DIAGNOSIS — Z86718 Personal history of other venous thrombosis and embolism: Secondary | ICD-10-CM | POA: Diagnosis not present

## 2019-10-08 LAB — COMPREHENSIVE METABOLIC PANEL
ALT: 48 U/L — ABNORMAL HIGH (ref 0–44)
AST: 42 U/L — ABNORMAL HIGH (ref 15–41)
Albumin: 3.8 g/dL (ref 3.5–5.0)
Alkaline Phosphatase: 178 U/L — ABNORMAL HIGH (ref 38–126)
Anion gap: 9 (ref 5–15)
BUN: 13 mg/dL (ref 6–20)
CO2: 24 mmol/L (ref 22–32)
Calcium: 9.2 mg/dL (ref 8.9–10.3)
Chloride: 104 mmol/L (ref 98–111)
Creatinine, Ser: 0.74 mg/dL (ref 0.44–1.00)
GFR calc Af Amer: >60 mL/min (ref >60)
GFR calc non Af Amer: >60 mL/min (ref >60)
Glucose, Bld: 98 mg/dL (ref 70–99)
Potassium: 4.3 mmol/L (ref 3.5–5.1)
Sodium: 137 mmol/L (ref 135–145)
Total Bilirubin: 0.4 mg/dL (ref 0.3–1.2)
Total Protein: 7.5 g/dL (ref 6.5–8.1)

## 2019-10-08 LAB — CBC WITH DIFFERENTIAL/PLATELET
Abs Immature Granulocytes: 0.24 10*3/uL — ABNORMAL HIGH (ref 0.00–0.07)
Basophils Absolute: 0 10*3/uL (ref 0.0–0.1)
Basophils Relative: 0 %
Eosinophils Absolute: 0 10*3/uL (ref 0.0–0.5)
Eosinophils Relative: 0 %
HCT: 32.6 % — ABNORMAL LOW (ref 36.0–46.0)
Hemoglobin: 10.3 g/dL — ABNORMAL LOW (ref 12.0–15.0)
Immature Granulocytes: 2 %
Lymphocytes Relative: 6 %
Lymphs Abs: 0.7 10*3/uL (ref 0.7–4.0)
MCH: 33.7 pg (ref 26.0–34.0)
MCHC: 31.6 g/dL (ref 30.0–36.0)
MCV: 106.5 fL — ABNORMAL HIGH (ref 80.0–100.0)
Monocytes Absolute: 0.5 10*3/uL (ref 0.1–1.0)
Monocytes Relative: 5 %
Neutro Abs: 10.1 10*3/uL — ABNORMAL HIGH (ref 1.7–7.7)
Neutrophils Relative %: 87 %
Platelets: 217 10*3/uL (ref 150–400)
RBC: 3.06 MIL/uL — ABNORMAL LOW (ref 3.87–5.11)
RDW: 15.3 % (ref 11.5–15.5)
WBC: 11.7 10*3/uL — ABNORMAL HIGH (ref 4.0–10.5)
nRBC: 0 % (ref 0.0–0.2)

## 2019-10-08 LAB — MAGNESIUM: Magnesium: 1.4 mg/dL — ABNORMAL LOW (ref 1.7–2.4)

## 2019-10-08 MED ORDER — SODIUM CHLORIDE 0.9 % IV SOLN
Freq: Once | INTRAVENOUS | Status: AC
  Filled 2019-10-08: qty 250

## 2019-10-08 MED ORDER — HEPARIN SOD (PORK) LOCK FLUSH 100 UNIT/ML IV SOLN
500.0000 [IU] | Freq: Once | INTRAVENOUS | Status: AC
  Administered 2019-10-08: 13:00:00 500 [IU] via INTRAVENOUS
  Filled 2019-10-08: qty 5

## 2019-10-08 MED ORDER — HEPARIN SOD (PORK) LOCK FLUSH 100 UNIT/ML IV SOLN
INTRAVENOUS | Status: AC
  Filled 2019-10-08: qty 5

## 2019-10-08 MED ORDER — MAGNESIUM SULFATE 4 GM/100ML IV SOLN
4.0000 g | Freq: Once | INTRAVENOUS | Status: AC
  Administered 2019-10-08: 10:00:00 4 g via INTRAVENOUS
  Filled 2019-10-08: qty 100

## 2019-10-08 MED ORDER — SODIUM CHLORIDE 0.9% FLUSH
10.0000 mL | INTRAVENOUS | Status: DC | PRN
  Administered 2019-10-08: 08:00:00 10 mL via INTRAVENOUS
  Filled 2019-10-08: qty 10

## 2019-10-08 NOTE — Progress Notes (Signed)
[place] NOTE  Patient Care Team: Delsa Grana, PA-C as PCP - General (Family Medicine) Mellody Drown, MD as Consulting Physician (Obstetrics and Gynecology) [name], MD as Consulting Physician (Gastroenterology) Michael Boston, MD as Consulting Physician (General Surgery) Lovell Sheehan, MD as Consulting Physician (Orthopedic Surgery) Cammie Sickle, MD as Consulting Physician (Oncology) Earnestine Leys, MD as Consulting Physician (Orthopedic Surgery)  CHIEF COMPLAINTS/PURPOSE OF CONSULTATION: Cervical cancer  #  Oncology History Overview Note  # dec 2017- CERVICAL ADENO CA [[place]]; Stage IB [Dr. Christene Slates at [facility] in [place], [place];No surgery- Chemo-RT;   # 2018-sep - lung nodules [in [place]]  # AUG 20th, 2019-#1 Carbo-Taxol s/p 6 cycles- [No avastin sec to blood clots]: Feb 2020-bilateral lung nodules with 1 cm in size. March 2020- finished carbo-taxol #7; poor tolerance to [place].  # April 15 th 2020- Start Taxol weekly x 3; one week OFF;  # July 2020-CT- bil cavitary lesions- slightly bigger by few mm/overall STABLE; CT a/p-NED. 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  She received treatment from 11/2016-05/2017.  01/2017 cisplatin x 2 and carboplatin x 1 (01/29/2017) due to ARF and XRT.  XRT was followed by T & O on 02/01/2017 and T & N 02/10/2017 and 02/20/2017.  Course was  complicated by 80 pound weight loss, nausea, vomiting, electrolyte wasting (potassium and magnesium).  She describes that.  Is been sick constantly requiring at least 20 hospitalizations.  Follow-up CT chest and PET on 06/2017. Per patient, 'radiation worked' and no disease in the abdomen.  At that time she was noted to have lung nodules that were growing and follow-up imaging was scheduled for 10/2017.  She was admitted to hospital in [place] for small bowel obstruction which was managed conservatively and she was home for a week prior to traveling to [place] for Thanksgiving holiday where she has family.  She presented to ER in [place] on 08/2017 with nausea, vomiting, and lower abdominal pain.  Symptoms did not respond to conservative treatment.   CT on 08/26/2017 revealed small bowel obstruction with transition in the pelvis just superior to the uterus rather was a long segment of distal ileum with fatty wall thickening compatible with chronic inflammation and/or radiation enteritis. Imaging showed numerous pulmonary nodules consistent with metastatic disease. She underwent laparotomy and right ileocolectomy on 08/31/2017 at [facility].  Surgical findings revealed a thickened, matted, and scarred piece of distal small bowel close to the ileocecal valve.  She was discharged on 09/05/2017.  Pain markedly increased in intensity and imaging was performed on 09/11/2017 which revealed: Debris within the anterior abdominal wall incision concerning for infection versus packing material, s/p post ileo-colectomy with expected postoperative changes, mild colonic ileus, numerous pulmonary nodules highly concerning for metastatic disease, punctate nonobstructing nephrolithiasis.  Staples were removed and one was packed.  She was started on doxycycline.  Abdominal and pelvic CT without contrast on 09/11/2017 revealed debris within anterior abdominal wall incision concerning for  infection, versus packing  material.   She is s/p ileocolectomy with expected postoperative changes and mild colonic ileus.  There were numerous pulmonary nodules highly concerning for metastatic disease and punctate nonobstructing nephrolithiasis. She was readmitted on 09/12/2017.  She describes the onset of lower abdominal pain on 09/09/2017.  Pain markedly increased in intensity on 09/11/2017.   Staples were removed and the wound packed.  She was started on doxycycline.  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   PET scan was ordered to evaluate enlarging lung nodules with concern for recurrent cervical cancer but scan was delayed due to insurance and need to be performed in [place].  Presented to ER on 12/03/2017 for abdominal pain and emesis.  Imaging concerning for worsening possible uterine infarct and she was admitted to hospital.  Pelvic MRI was unremarkable.  Remote scarring type changes of uterus thought to be possibly related to radiation.  She was discharged on 12/08/2017.  Underwent endoscopy and colonoscopy on 12/20/2017.    On 02/22/2018 she underwent laser ablation of condylomata around the anus and vagina under anesthesia with Dr. Johney Maine.   04/15/2018:  Chest, abdomen, and pelvis CT revealed innumerable (> 100) cavitary nodules scattered in the lungs, moderately enlarging compared to the 11/08/2017 PET-CT, suspicious for metastatic disease.  One index node in the RLL measures 1.0 x 1.1 cm (previously 0.6 x 0.6 cm).  There were no new nodules.  There was an ill-defined wall thickening in the rectosigmoid with surrounding stranding  along fascia planes, probably sequela from prior radiation therapy.  There was multilevel lumbar impingement due to spondylosis and degenerative disc disease.  There was heterogeneous enhancement in the uterus (some possibly from prior radiation therapy).   04/23/2018:  PET scan revealed numerous scattered solid and cavitary nodules in the lungs stable increased in size compared to the prior PET-CT from 11/08/2017. Largest nodule was 1.1 cm in the LUL (SUV 1.9).  These demonstrated low-grade metabolic activity up to a maximum SUV of about 2.3, increased from 11/08/2017.    Case was discussed at tumor board on 04/25/2018. Consensus to pursue CT-guided biopsy (05/06/18) which revealed: Metastatic adenocarcinoma, morphologically consistent with cervical adenocarcinoma.  She has history of chronic hepatitis C which is managed by GI.  Hepatitis C genotype is 2a/2c.  She receives B12 injections for history of B12 deficiency.  On 04/24/2018 she underwent left total knee replacement with Dr. [name]. --------------------------------------------------------   DIAGNOSIS: Cervical adenocarcinoma  STAGE: IV        ;GOALS: Palliative  CURRENT/MOST RECENT THERAPY : Alimta [C]    Malignant neoplasm of overlapping sites of cervix ([place])  01/29/2019 - 04/23/2019 Chemotherapy   The patient had PACLitaxel (TAXOL) 156 mg in sodium chloride 0.9 % 250 mL chemo infusion (</= 79m/m2), 80 mg/m2 = 156 mg, Intravenous,  Once, 3 of 4 cycles Dose modification: 65 mg/m2 (original dose 80 mg/m2, Cycle 1, Reason: Provider Judgment) Administration: 156 mg (01/29/2019), 156 mg (02/05/2019), 126 mg (02/12/2019), 126 mg (02/26/2019), 126 mg (03/13/2019), 126 mg (03/27/2019), 126 mg (03/20/2019), 126 mg (04/03/2019), 126 mg (04/10/2019)  for chemotherapy treatment.    05/08/2019 - 07/30/2019 Chemotherapy  The patient had pembrolizumab (KEYTRUDA) 200 mg in sodium chloride 0.9 % 50 mL chemo infusion, 200 mg, Intravenous, Once, 4 of 6  cycles Administration: 200 mg (05/08/2019), 200 mg (05/29/2019), 200 mg (06/19/2019), 200 mg (07/10/2019)  for chemotherapy treatment.    08/07/2019 -  Chemotherapy   The patient had pegfilgrastim-cbqv [facility]) injection 6 mg, 6 mg, Subcutaneous, Once, 2 of 5 cycles Administration: 6 mg (08/29/2019), 6 mg (09/19/2019) PEMEtrexed (ALIMTA) 1,000 mg in sodium chloride 0.9 % 100 mL chemo infusion, 950 mg, Intravenous,  Once, 3 of 6 cycles Administration: 1,000 mg (08/07/2019), 1,000 mg (09/18/2019), 1,000 mg (08/28/2019)  for chemotherapy treatment.       HISTORY OF PRESENTING ILLNESS:  Joanna Hall 53 y.o.  female with a history of adenocarcinoma the cervix with metastasis to the lung status post multiple lines of therapy currently on Alimta is here for follow-up/ review the results of the CT scan.  She is accompanied by her mother.  Patient's appetite is good.  No nausea no vomiting.  Abdominal pain not getting any worse.  She continues to be on narcotics.  No worsening diarrhea constipation.  Chronic tingling numbness.  Chronic joint pain.  Review of Systems  Constitutional: Positive for malaise/fatigue. Negative for chills, diaphoresis, fever and weight loss.  HENT: Negative for nosebleeds and sore throat.   Eyes: Negative for double vision.  Respiratory: Negative for cough, hemoptysis, sputum production, shortness of breath and wheezing.   Cardiovascular: Negative for chest pain, palpitations, orthopnea and leg swelling.  Gastrointestinal: Positive for abdominal pain and constipation. Negative for blood in stool, heartburn, melena, nausea and vomiting.  Genitourinary: Negative for dysuria, frequency and urgency.  Musculoskeletal: Positive for back pain and joint pain.  Skin: Negative.  Negative for itching and rash.  Neurological: Positive for tingling. Negative for dizziness, focal weakness, weakness and headaches.  Endo/Heme/Allergies: Does not bruise/bleed easily.   Psychiatric/Behavioral: Negative for depression. The patient is not nervous/anxious and does not have insomnia.      MEDICAL HISTORY:  Past Medical History:  Diagnosis Date  . Abdominal pain 06/10/2018  . Abnormal cervical Papanicolaou smear 09/18/2017  . Anxiety   . Aortic atherosclerosis ([place])   . Arthritis    neck and knees  . 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 Blood transfusion without reported diagnosis   . Cervical cancer ([place]) 09/2016   mets lung  . Chronic anal fissure   . Chronic diarrhea   . Dyspnea   . Erosive gastropathy 09/18/2017  . Factor V Leiden mutation ([place])   . Fecal incontinence   . Genital warts   . GERD (gastroesophageal reflux disease)   . GI bleed 10/08/2018  . Heart murmur   . Hematochezia   . Hemorrhoids   . 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 Hepatitis, chronic ([place]) 05/05/2017  . History of cancer chemotherapy    completed 06/2017  . History of Clostridium difficile infection    while undergoing chemo.  Negative test 10/2017  . Ileocolic anastomotic leak   . 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 Intestinal infection due to Clostridium difficile 09/18/2017  . Macrocytic anemia with vitamin B12 deficiency   . Multiple gastric ulcers   . Nausea vomiting and diarrhea   . Pancolitis ([place]) 07/27/2018  . Perianal condylomata   . Pneumonia    History of  . Pulmonary  nodules   . Rectal bleeding   . Small bowel obstruction ([place]) 08/2017  . Stiff neck    limited right turn  . Vitamin D deficiency     SURGICAL HISTORY: Past Surgical History:  Procedure Laterality Date  . CHOLECYSTECTOMY    . COLON SURGERY  08/2017   resection  . COLONOSCOPY WITH PROPOFOL N/A 12/20/2017   Procedure: COLONOSCOPY WITH PROPOFOL;  Surgeon: [name], MD;  Location: [facility] ENDOSCOPY;  Service: Gastroenterology;  Laterality: N/A;  . COLONOSCOPY WITH PROPOFOL N/A 07/30/2018   Procedure:  COLONOSCOPY WITH PROPOFOL;  Surgeon: [name], MD;  Location: [facility] ENDOSCOPY;  Service: Gastroenterology;  Laterality: N/A;  . COLONOSCOPY WITH PROPOFOL N/A 10/10/2018   Procedure: COLONOSCOPY WITH PROPOFOL;  Surgeon: Lucilla Lame, MD;  Location: [facility] ENDOSCOPY;  Service: Endoscopy;  Laterality: N/A;  . DIAGNOSTIC LAPAROSCOPY    . ESOPHAGOGASTRODUODENOSCOPY (EGD) WITH PROPOFOL N/A 12/20/2017   Procedure: ESOPHAGOGASTRODUODENOSCOPY (EGD) WITH PROPOFOL;  Surgeon: [name], MD;  Location: [facility];  Service: Gastroenterology;  Laterality: N/A;  . ESOPHAGOGASTRODUODENOSCOPY (EGD) WITH PROPOFOL  07/30/2018   Procedure: ESOPHAGOGASTRODUODENOSCOPY (EGD) WITH PROPOFOL;  Surgeon: [name], MD;  Location: [facility] ENDOSCOPY;  Service: Gastroenterology;;  . Otho Darner SIGMOIDOSCOPY N/A 11/21/2018   Procedure: FLEXIBLE SIGMOIDOSCOPY;  Surgeon: [name], MD;  Location[facility] ENDOSCOPY;  Service: Gastroenterology;  Laterality: N/A;  . LAPAROTOMY N/A 08/31/2017   Procedure: EXPLORATORY LAPAROTOMY for SBO, ileocolectomy, removal of piece of uterine wall;  Surgeon: [name], MD;  Location: [facility] ORS;  Service: General;  Laterality: N/A;  . LASER ABLATION CONDOLAMATA N/A 02/22/2018   Procedure: LASER ABLATION/REMOVAL OF ONGEXBMWUXL AROUND ANUS AND VAGINA;  Surgeon: Michael Boston, MD;  Location: [facility];  Service: General;  Laterality: N/A;  . OOPHORECTOMY    . PORTA CATH INSERTION N/A 05/13/2018   Procedure: PORTA CATH INSERTION;  Surgeon: Katha Cabal, MD;  Location: [place] [facility] CV LAB;  Service: Cardiovascular;  Laterality: N/A;  . SMALL INTESTINE SURGERY    . TANDEM RING INSERTION     x3  . THORACOTOMY    . TOTAL KNEE ARTHROPLASTY Left 04/24/2018   Procedure: TOTAL KNEE ARTHROPLASTY;  Surgeon: Lovell Sheehan, MD;  Location: [facility] ORS;  Service: Orthopedics;  Laterality: Left;    SOCIAL HISTORY: Social History   Socioeconomic History  .  Marital status: Divorced    Spouse name: Not on file  . Number of children: Not on file  . Years of education: Not on file  . Highest education level: Not on file  Occupational History  . Not on file  Tobacco Use  . Smoking status: Former Smoker    Packs/day: 0.25    Years: 10.00    Pack years: 2.50    Types: Cigarettes    Quit date: 10/16/2006    Years since quitting: 12.9  . Smokeless tobacco: Never Used  Substance and Sexual Activity  . Alcohol use: Not Currently    Comment: seldom  . Drug use: Yes    Types: Marijuana    Comment: not very often  . Sexual activity: Not Currently    Birth control/protection: Post-menopausal    Comment: Not Asked  Other Topics Concern  . Not on file  Social History Narrative  . Not on file   Social Determinants of Health   Financial Resource Strain:   . Difficulty of Paying Living Expenses: Not on file  Food Insecurity:   . Worried About Charity fundraiser  in the Last Year: Not on file  . Ran Out of Food in the Last Year: Not on file  Transportation Needs:   . Lack of Transportation (Medical): Not on file  . Lack of Transportation (Non-Medical): Not on file  Physical Activity:   . Days of Exercise per Week: Not on file  . Minutes of Exercise per Session: Not on file  Stress:   . Feeling of Stress : Not on file  Social Connections:   . Frequency of Communication with Friends and Family: Not on file  . Frequency of Social Gatherings with Friends and Family: Not on file  . Attends Religious Services: Not on file  . Active Member of Clubs or Organizations: Not on file  . Attends Archivist Meetings: Not on file  . Marital Status: Not on file  Intimate Partner Violence:   . Fear of Current or Ex-Partner: Not on file  . Emotionally Abused: Not on file  . Physically Abused: Not on file  . Sexually Abused: Not on file    FAMILY HISTORY: Family History  Problem Relation Age of Onset  . Hypertension Mother   .  Hypertension Father   . Diabetes Father   . Hyperlipidemia Father   . Alcohol abuse Daughter   . Hypertension Maternal Grandmother   . Diabetes Maternal Grandmother   . Diabetes Paternal Grandmother   . Hypertension Paternal Grandmother     ALLERGIES:  is allergic to ketamine.  MEDICATIONS:  Current Outpatient Medications  Medication Sig Dispense Refill  . amitriptyline (ELAVIL) 75 MG tablet Take 1 tablet (75 mg total) by mouth at bedtime. 90 tablet 1  . Calcium Carb-Cholecalciferol (CALCIUM 500 +D) 500-400 MG-UNIT TABS Take 2 tablets by mouth daily.    . cholestyramine (QUESTRAN) 4 g packet Take 1 packet (4 g total) by mouth 2 (two) times daily. 60 each 12  . diphenoxylate-atropine (LOMOTIL) 2.5-0.025 MG tablet Take 2 tablets by mouth 2 (two) times daily as needed for diarrhea or loose stools.    . folic acid (FOLVITE) 1 MG tablet Take 1 tablet (1 mg total) by mouth daily. 30 tablet 6  . hyoscyamine (LEVBID) 0.375 MG 12 hr tablet Take 1 tablet (0.375 mg total) by mouth 2 (two) times daily as needed for cramping.    Marland Kitchen oxyCODONE (OXYCONTIN) 20 mg 12 hr tablet Take 1 tablet (20 mg total) by mouth every 8 (eight) hours. 90 tablet 0  . Oxycodone HCl 10 MG TABS Take 1-2 tablets (10-20 mg total) by mouth every 6 (six) hours as needed (for breakthrough pain). 120 tablet 0  . pantoprazole (PROTONIX) 40 MG tablet Take 1 tablet (40 mg total) by mouth daily. 90 tablet 1  . potassium chloride SA (KLOR-CON) 20 MEQ tablet Take 1 tablet (20 mEq total) by mouth 2 (two) times daily. 30 tablet 0  . promethazine (PHENERGAN) 25 MG tablet Take 1 tablet (25 mg total) by mouth every 8 (eight) hours as needed for nausea or vomiting. 30 tablet 6  . rivaroxaban (XARELTO) 20 MG TABS tablet Take 1 tablet (20 mg total) by mouth every morning. Takes in the morning    . sertraline (ZOLOFT) 25 MG tablet Take 1 tablet (25 mg total) by mouth daily. 30 tablet 0  . traMADol (ULTRAM) 50 MG tablet Take 50 mg by mouth every 6  (six) hours as needed.    . VENTOLIN HFA 108 (90 Base) MCG/ACT inhaler Inhale 1-2 puffs into the lungs every 4 (four) hours  as needed for shortness of breath. 1 Inhaler 1  . famotidine (PEPCID) 20 MG tablet Take 1 tablet (20 mg total) by mouth 2 (two) times daily for 5 days. (Patient taking differently: Take 20 mg by mouth 2 (two) times daily as needed. ) 10 tablet 0   No current facility-administered medications for this visit.   Facility-Administered Medications Ordered in Other Visits  Medication Dose Route Frequency Provider Last Rate Last Admin  . heparin lock flush 100 unit/mL  500 Units Intravenous Once Corcoran, Melissa C, MD      . heparin lock flush 100 unit/mL  500 Units Intravenous Once Charlaine Dalton R, MD      . sodium chloride 0.9 % 1,000 mL with potassium chloride 20 mEq infusion   Intravenous Once Honor Loh E, NP      . sodium chloride flush (NS) 0.9 % injection 10 mL  10 mL Intravenous Once Borders, Vonna Kotyk R, NP      . sodium chloride flush (NS) 0.9 % injection 10 mL  10 mL Intravenous PRN Cammie Sickle, MD   10 mL at 10/08/19 0817      .  PHYSICAL EXAMINATION: ECOG PERFORMANCE STATUS: 1 - Symptomatic but completely ambulatory  Vitals:   10/08/19 0843  BP: 128/81  Pulse: 96  Resp: 20  Temp: (!) 95.4 F (35.2 C)   Filed Weights   10/08/19 0843  Weight: 171 lb 6.4 oz (77.7 kg)    Physical Exam  Constitutional: She is oriented to person, place, and time and well-developed, well-nourished, and in no distress.  Alone.  Walking herself.   HENT:  Head: Normocephalic and atraumatic.  Mouth/Throat: Oropharynx is clear and moist. No oropharyngeal exudate.  Eyes: Pupils are equal, round, and reactive to light.  Cardiovascular: Normal rate and regular rhythm.  Pulmonary/Chest: Effort normal and breath sounds normal. No respiratory distress. She has no wheezes.  Abdominal: Soft. Bowel sounds are normal. She exhibits no distension and no mass. There is no  rebound and no guarding.  Musculoskeletal:        General: No tenderness or edema. Normal range of motion.     Cervical back: Normal range of motion and neck supple.  Neurological: She is alert and oriented to person, place, and time.  Skin: Skin is warm.  Psychiatric: Affect normal.   LABORATORY DATA:  I have reviewed the data as listed Lab Results  Component Value Date   WBC 11.7 (H) 10/08/2019   HGB 10.3 (L) 10/08/2019   HCT 32.6 (L) 10/08/2019   MCV 106.5 (H) 10/08/2019   PLT 217 10/08/2019   Recent Labs    10/21/18 0845 09/25/19 1338 09/27/19 0518 09/28/19 0418 10/08/19 0806  NA 134*  --  135 135 137  K 3.9  --  3.5 3.5 4.3  CL 98  --  106 103 104  CO2 24  --  22 23 24   GLUCOSE 111*  --  104* 113* 98  BUN 17  --  <5* <5* 13  CREATININE 0.91  --  0.79 0.66 0.74  CALCIUM 9.6  --  8.5* 8.6* 9.2  GFRNONAA >60  --  >60 >60 >60  GFRAA >60  --  >60 >60 >60  PROT 8.6* 7.4  --  6.6 7.5  ALBUMIN 4.4 3.5  --  3.2* 3.8  AST 48* 83*  --  89* 42*  ALT 67* 134*  --  132* 48*  ALKPHOS 185* 180*  --  182* 178*  BILITOT 1.4* 0.6  --  0.6 0.4  BILIDIR 0.3* 0.2  --   --   --   IBILI  --  0.4  --   --   --     RADIOGRAPHIC STUDIES: I have personally reviewed the radiological images as listed and agreed with the findings in the report. DG Chest 2 View  Result Date: 09/25/2019 CLINICAL DATA:  History of metastatic cervical cancer. Chest pain, diarrhea, nausea and blood in stool. EXAM: CHEST - 2 VIEW COMPARISON:  CT chest 08/01/2019. Single-view of the chest 01/26/2019 FINDINGS: Scattered pulmonary nodules are seen but are better visualized on the prior CT scan. Lungs are otherwise clear without consolidative process or edema. No pneumothorax or pleural effusion. Heart size is normal. No acute or focal bony abnormality. Port-A-Cath is in place. IMPRESSION: No acute disease in patient with bilateral pulmonary nodules from cervical carcinoma as seen on prior CT. Electronically Signed    By: Inge Rise M.D.   On: 09/25/2019 08:44   CT Chest W Contrast  Result Date: 10/06/2019 CLINICAL DATA:  History of cervical neoplasm. EXAM: CT CHEST WITH CONTRAST TECHNIQUE: Multidetector CT imaging of the chest was performed during intravenous contrast administration. CONTRAST:  63m OMNIPAQUE IOHEXOL 300 MG/ML  SOLN COMPARISON:  Abdomen pelvis CT of 09/22/2019, CT chest 08/01/2019 FINDINGS: Cardiovascular: Scattered atherosclerosis in the thoracic aorta. Aorta is nondilated. Heart size is normal without pericardial effusion. Central pulmonary vasculature is normal. Right-sided Port-A-Cath remains in situ, terminating at the caval to atrial junction. Mediastinum/Nodes: No enlarged mediastinal, hilar, or axillary lymph nodes. Thyroid gland, trachea, and esophagus demonstrate no significant findings. Lungs/Pleura: Marked interval improvement of pulmonary lesions still with numerous lesions but with marked decrease in size. (Image 65, series 3) 5 mm predominantly cystic lesion with only thin wall remaining previously measured approximately 11 mm with a 4 mm thick wall surrounding an area of cavitation (Image 102, series 3) 5 mm nodule previously approximately 14 mm. Mainly a small cavitary focus on today's study, following other lesions seen on today's scan as compared to the prior. (Image 121, series 3) 5 mm lesion previously measuring approximately 11 mm. No signs of new lesion. Lesions are either diminished in size or in visible on today's study. There is some ground-glass opacity in the upper lobes. No dense consolidation. No significant interstitial thickening. Airways are patent. Upper Abdomen: Post cholecystectomy. Upper abdominal contents are incompletely imaged. Question of colitis seen on the prior study, not well evaluated on the current scan. No signs of ascites. No free air. Musculoskeletal: No acute bone finding or destructive bone process. IMPRESSION: 1. Marked interval improvement of  pulmonary metastatic disease. 2. Question of colitis seen on the prior study, not well evaluated on the current scan. 3. Aortic atherosclerosis. 4. Right-sided Port-A-Cath remains in situ, terminating at the caval to atrial junction. Aortic Atherosclerosis (ICD10-I70.0). Electronically Signed   By: GZetta BillsM.D.   On: 10/06/2019 16:07   CT ABDOMEN PELVIS W CONTRAST  Result Date: 09/22/2019 CLINICAL DATA:  Acute abdominal pain EXAM: CT ABDOMEN AND PELVIS WITH CONTRAST TECHNIQUE: Multidetector CT imaging of the abdomen and pelvis was performed using the standard protocol following bolus administration of intravenous contrast. CONTRAST:  1016mOMNIPAQUE IOHEXOL 300 MG/ML  SOLN COMPARISON:  08/13/2019 FINDINGS: Lower chest: Again noted are multiple small cavitary lesions in the bilateral lower lobes, significantly improved from prior study.The heart size is normal. Hepatobiliary: The liver is normal. Status post cholecystectomy.There is intrahepatic and extrahepatic biliary  ductal dilatation which is similar to prior study. Pancreas: Normal contours without ductal dilatation. No peripancreatic fluid collection. Spleen: No splenic laceration or hematoma. Adrenals/Urinary Tract: --Adrenal glands: No adrenal hemorrhage. --Right kidney/ureter: No hydronephrosis or perinephric hematoma. --Left kidney/ureter: No hydronephrosis or perinephric hematoma. --Urinary bladder: The urinary bladder is decompressed which limits evaluation. Stomach/Bowel: --Stomach/Duodenum: No hiatal hernia or other gastric abnormality. Normal duodenal course and caliber. --Small bowel: No dilatation or inflammation. --Colon: There is mild diffuse wall thickening throughout much of the colon, perhaps most evident at the splenic flexure. --Appendix: Surgically absent. Vascular/Lymphatic: Atherosclerotic calcification is present within the non-aneurysmal abdominal aorta, without hemodynamically significant stenosis. --No retroperitoneal  lymphadenopathy. --No mesenteric lymphadenopathy. --No pelvic or inguinal lymphadenopathy. Reproductive: Unremarkable Other: No ascites or free air. The abdominal wall is normal. Musculoskeletal. There is stable asymmetric sclerosis involving the parasymphyseal left pubic bone. While this is stable from prior study, it is new from 01/23/2019. IMPRESSION: 1. Mild diffuse wall thickening throughout much of the colon, perhaps most evident at the splenic flexure. This could represent an infectious or inflammatory colitis. 2. Multiple small cavitary lesions in the bilateral lower lobes, significantly improved from prior study. These are consistent with improving metastatic lesions. 3. Sclerosis involving the parasymphyseal left pubic bone. This is new since April 2020. Findings may represent metastatic disease or a healing subacute fracture. Attention on follow-up examinations is recommended. Aortic Atherosclerosis (ICD10-I70.0). Electronically Signed   By: Constance Holster M.D.   On: 09/22/2019 19:29   DG Abd Acute W/Chest  Result Date: 09/16/2019 CLINICAL DATA:  Shortness of breath and abdominal pain. History of cervical carcinoma EXAM: DG ABDOMEN ACUTE W/ 1V CHEST COMPARISON:  Chest CT August 01, 2019; abdominal radiographs July 23, 2019; CT abdomen and pelvis July 22, 2019 FINDINGS: PA chest: The cavitary nodular lesions seen on chest CT approximately 7 weeks prior are not appreciable by radiography. There is mild chronic blunting of the left costophrenic angle. No edema or consolidation evident by radiography. Heart size and pulmonary vascularity are normal. No adenopathy. Port-A-Cath tip is in the superior vena cava. Supine and upright abdomen: There is no bowel dilatation or air-fluid level to suggest bowel obstruction. No free air. Surgical clips are noted in the gallbladder fossa region. No blastic or lytic bone lesions. There is degenerative change in the lumbar spine with scoliosis. There are  apparent phleboliths in the pelvis. IMPRESSION: No bowel obstruction or free air demonstrable. Surgical clips in gallbladder fossa region. No edema or consolidation. The nodular lesions seen on recent CT are not appreciable by radiography. Port-A-Cath tip in superior vena cava. Cardiac silhouette within normal limits. Electronically Signed   By: Lowella Grip III M.D.   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 CT chest- dec 21nd- Improved lung nodues.   #Patient tolerating Alimta with moderate-severe difficulties needing multiple admission to the hospital [see below].   #Given the significant improvement on cancer based on CT scan chest-I think is reasonable to continue current therapy at this time.  Patient understands pros and cons of continued chemotherapy; wants to continue for now.  Hold today's chemotherapy-given the holidays.  We will restart chemotherapy the first week of January.  Patient is in agreement.  # Left shoulder effusion/inflammatory arthritis/ ? Right knee fluid aspiration currently on "gout medication"/ colchcine.  Stable  # Adrenal  insufficiency-on hydrocortisone 20 mg/day; hydrocortisone in PM.  Stable.  #Chronic diarrhea/constipation-recent colitis- recent antibiotics - improved.   #Peripheral neuropathy- stable;  Continue gabapentin.  # Hypomagnesemia-secondary platinum chemotherapy-today mag 1.4.  Proceed with magnesium.  #Chronic right lower quadrant abdominal pain/scar tissue versus others- currently on oxycodone.  Stable.  Discussed with Dr. Regenia Skeeter.  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  # DISPOSITION:  # HOLD chemo today; Mag 4 gm today.  # follow up in 1st week of Jan-MD-Alimta /cbc/cmp/mag/; Mag 4 gm/2 hour- Dr.B     All questions  were answered. The patient knows to call the clinic with any problems, questions or concerns.    Cammie Sickle, MD 10/08/2019 9:54 AM

## 2019-10-08 NOTE — Progress Notes (Signed)
[place]  [phone] [phone]   Name: Joanna Hall Date: 10/08/2019 MRN: [id number]  DOB: 01-Nov-1965  Patient Care Team: Delsa Grana, PA-C as PCP - General (Family Medicine) Mellody Drown, MD as Consulting Physician (Obstetrics and Gynecology) [name], MD as Consulting Physician (Gastroenterology) Michael Boston, MD as Consulting Physician (General Surgery) Lovell Sheehan, MD as Consulting Physician (Orthopedic Surgery) Cammie Sickle, MD as Consulting Physician (Oncology) Earnestine Leys, MD as Consulting Physician (Orthopedic Surgery)    [place]: Palliative Care consult requested for [phone] y.o.femalewith multiple medical problems including 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 Patient was recently hospitalized 07/22/2019-07/24/2019 with abdominal pain and constipation.  She was hospitalized again 08/13/2019-08/19/2019 with intractable abdominal pain.  She is now readmitted 09/25/2019 for abdominal pain and diarrhea. This is patient's fifth hospitalization in the past three months. Patient has also had multiple ER visits for nausea and abdominal pain.    SOCIAL HISTORY:     reports that she quit smoking about 12 years ago. Her smoking use included cigarettes. She has a 2.50 pack-year smoking history. She has never used smokeless tobacco. She reports previous alcohol use. She reports current drug use. Drug: Marijuana.   Patient is not married. She lives at home with her mother and father. She has an adult daughter who lives in [place]. She had another daughter who is now deceased.   ADVANCE DIRECTIVES:  Not on  file  CODE STATUS: DNR  PAST MEDICAL HISTORY: Past Medical History:  Diagnosis Date  . Abdominal pain 06/10/2018  . Abnormal cervical Papanicolaou smear 09/18/2017  . Anxiety   . Aortic atherosclerosis ([place])   . Arthritis    neck and knees  . 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 Blood transfusion without reported diagnosis   . Cervical cancer ([place]) 09/2016   mets lung  . Chronic anal fissure   . Chronic diarrhea   . Dyspnea   . Erosive gastropathy 09/18/2017  . Factor V Leiden mutation ([place])   . Fecal incontinence   . Genital warts   . GERD (gastroesophageal reflux disease)   . GI bleed 10/08/2018  . Heart murmur   . Hematochezia   . Hemorrhoids   . 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 Hepatitis, chronic ([place]) 05/05/2017  . History of cancer chemotherapy    completed 06/2017  . History of Clostridium difficile infection    while undergoing chemo.  Negative test 10/2017  . Ileocolic anastomotic leak   . 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 Intestinal infection due to Clostridium difficile 09/18/2017  . Macrocytic anemia with vitamin B12 deficiency   . Multiple gastric ulcers   . Nausea vomiting and diarrhea   . Pancolitis ([place]) 07/27/2018  . Perianal condylomata   . Pneumonia    History of  . Pulmonary nodules   . Rectal bleeding   . Small bowel obstruction ([place]) 08/2017  . Stiff neck    limited right turn  . Vitamin D deficiency     PAST SURGICAL HISTORY:  Past Surgical History:  Procedure Laterality Date  . CHOLECYSTECTOMY    .  COLON SURGERY  08/2017   resection  . COLONOSCOPY WITH PROPOFOL N/A 12/20/2017   Procedure: COLONOSCOPY WITH PROPOFOL;  Surgeon: [name], MD;  Location: [facility] ENDOSCOPY;  Service: Gastroenterology;  Laterality: N/A;  . COLONOSCOPY WITH PROPOFOL N/A 07/30/2018   Procedure: COLONOSCOPY WITH PROPOFOL;  Surgeon: [name], MD;  Location[facility] ENDOSCOPY;   Service: Gastroenterology;  Laterality: N/A;  . COLONOSCOPY WITH PROPOFOL N/A 10/10/2018   Procedure: COLONOSCOPY WITH PROPOFOL;  Surgeon: Lucilla Lame, MD;  Location: [facility] ENDOSCOPY;  Service: Endoscopy;  Laterality: N/A;  . DIAGNOSTIC LAPAROSCOPY    . ESOPHAGOGASTRODUODENOSCOPY (EGD) WITH PROPOFOL N/A 12/20/2017   Procedure: ESOPHAGOGASTRODUODENOSCOPY (EGD) WITH PROPOFOL;  Surgeon: [name], MD;  Location: [place];  Service: Gastroenterology;  Laterality: N/A;  . ESOPHAGOGASTRODUODENOSCOPY (EGD) WITH PROPOFOL  07/30/2018   Procedure: ESOPHAGOGASTRODUODENOSCOPY (EGD) WITH PROPOFOL;  Surgeon: [name], MD;  Location: [facility] ENDOSCOPY;  Service: Gastroenterology;;  . Otho Darner SIGMOIDOSCOPY N/A 11/21/2018   Procedure: FLEXIBLE SIGMOIDOSCOPY;  Surgeon: [name], MD;  Location: [facility] ENDOSCOPY;  Service: Gastroenterology;  Laterality: N/A;  . LAPAROTOMY N/A 08/31/2017   Procedure: EXPLORATORY LAPAROTOMY for SBO, ileocolectomy, removal of piece of uterine wall;  Surgeon: [name], MD;  Location: [facility] ORS;  Service: General;  Laterality: N/A;  . LASER ABLATION CONDOLAMATA N/A 02/22/2018   Procedure: LASER ABLATION/REMOVAL OF FTDDUKGURKY AROUND ANUS AND VAGINA;  Surgeon: Michael Boston, MD;  Location: [place];  Service: General;  Laterality: N/A;  . OOPHORECTOMY    . PORTA CATH INSERTION N/A 05/13/2018   Procedure: PORTA CATH INSERTION;  Surgeon: Katha Cabal, MD;  Location: [place] CV LAB;  Service: Cardiovascular;  Laterality: N/A;  . SMALL INTESTINE SURGERY    . TANDEM RING INSERTION     x3  . THORACOTOMY    . TOTAL KNEE ARTHROPLASTY Left 04/24/2018   Procedure: TOTAL KNEE ARTHROPLASTY;  Surgeon: Lovell Sheehan, MD;  Location: [facility] ORS;  Service: Orthopedics;  Laterality: Left;    HEMATOLOGY/ONCOLOGY HISTORY:  Oncology History Overview Note  # dec 2017- CERVICAL ADENO CA [[place]]; Stage IB [Dr. Christene Slates at [facility] in [place], [place];No surgery- Chemo-RT;   # 2018-sep - lung nodules [in [place]]  # AUG 20th, 2019-#1 Carbo-Taxol s/p 6 cycles- [No avastin sec to blood clots]: Feb 2020-bilateral lung nodules with 1 cm in size. March 2020- finished carbo-taxol #7; poor tolerance to [place].  # April 15 th 2020- Start Taxol weekly x 3; one week OFF;  # July 2020-CT- bil cavitary lesions- slightly bigger by few mm/overall STABLE; CT a/p-NED. STOP Taxol;  # July , 23rd 2020- Keytruda [CPS-15 ]x 4 cycles; CT mid OCT 2020-progressive disease in the lungs.;  Stop [facility]  # Oct 22nd Alimta q 3 w  # summer-/fall 2019-Bil PE/kidney infract; factor V Leiden Leiden - xarelto [small bowel obstruction]; [facility]   #Fall 2019 PE/renal infarct on Xarelto-factor V Leiden  # NGS/[facility]- PDL CPS 15; NEG for other targets**  ------------------------------------------------------------- She was treated by  Decision was made to pursue concurrent chemotherapy (weekly cisplatin) and radiation.  She received treatment from 11/2016-05/2017.  01/2017 cisplatin x 2 and carboplatin x 1 (01/29/2017) due to ARF and XRT.  XRT was followed by T & O on 02/01/2017 and T & N 02/10/2017 and 02/20/2017.  Course was complicated by 80 pound weight loss, nausea, vomiting, electrolyte wasting (potassium and magnesium).  She describes that.  Is been sick  constantly requiring at least 20 hospitalizations.  Follow-up CT chest and PET on 06/2017. Per patient, 'radiation worked' and no disease in the abdomen.  At that time she was noted to have lung nodules that were growing and follow-up imaging was scheduled for 10/2017.  She was admitted to hospital in [place] for small bowel obstruction which was managed conservatively and she was home for a week prior to traveling to [place] for Thanksgiving holiday where she has family.  She presented to ER in [place] on 08/2017 with nausea, vomiting, and  lower abdominal pain.  Symptoms did not respond to conservative treatment.   CT on 08/26/2017 revealed small bowel obstruction with transition in the pelvis just superior to the uterus rather was a long segment of distal ileum with fatty wall thickening compatible with chronic inflammation and/or radiation enteritis. Imaging showed numerous pulmonary nodules consistent with metastatic disease. She underwent laparotomy and right ileocolectomy on 08/31/2017 at [facility].  Surgical findings revealed a thickened, matted, and scarred piece of distal small bowel close to the ileocecal valve.  She was discharged on 09/05/2017.  Pain markedly increased in intensity and imaging was performed on 09/11/2017 which revealed: Debris within the anterior abdominal wall incision concerning for infection versus packing material, s/p post ileo-colectomy with expected postoperative changes, mild colonic ileus, numerous pulmonary nodules highly concerning for metastatic disease, punctate nonobstructing nephrolithiasis.  Staples were removed and one was packed.  She was started on doxycycline.  Abdominal and pelvic CT without contrast on 09/11/2017 revealed debris within anterior abdominal wall incision concerning for infection, versus packing material.   She is s/p ileocolectomy with expected postoperative changes and mild colonic ileus.  There were numerous pulmonary nodules highly concerning for metastatic disease and punctate nonobstructing nephrolithiasis. She was readmitted on 09/12/2017.  She describes the onset of lower abdominal pain on 09/09/2017.  Pain markedly increased in intensity on 09/11/2017.   Staples were removed and the wound packed.  She was started on doxycycline.  CT on 09/13/2017 showed postsurgical changes from ileocecectomy with primary ileocolic anastomosis without evidence of abscess or leak, edema small bowel loops of distal ileum, gas within ventral midline surgical wound corresponding to wound infection  versus packing material, small infarct at the inferior pole of left kidney, right uterine infarct.  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   PET scan was ordered to evaluate enlarging lung nodules with concern for recurrent cervical cancer but scan was delayed due to insurance and need to be performed in [place].  Presented to ER on 12/03/2017 for abdominal pain and emesis.  Imaging concerning for worsening possible uterine infarct and she was admitted to hospital.  Pelvic MRI was unremarkable.  Remote scarring type changes of uterus thought to be possibly related to radiation.  She was discharged on 12/08/2017.  Underwent endoscopy and colonoscopy on 12/20/2017.    On 02/22/2018 she underwent laser ablation of condylomata around the anus and vagina under anesthesia with Dr. Johney Maine.   04/15/2018:  Chest, abdomen, and pelvis CT revealed innumerable (> 100) cavitary nodules scattered in the lungs, moderately enlarging compared to the 11/08/2017 PET-CT, suspicious for metastatic disease.  One index node in the RLL measures 1.0 x 1.1 cm (previously 0.6 x 0.6 cm).  There were no new nodules.  There was an ill-defined wall thickening in the rectosigmoid with surrounding stranding along fascia planes, probably sequela from prior radiation therapy.  There was multilevel lumbar impingement  due to spondylosis and degenerative disc disease.  There was heterogeneous enhancement in the uterus (some possibly from prior radiation therapy).   04/23/2018:  PET scan revealed numerous scattered solid and cavitary nodules in the lungs stable increased in size compared to the prior PET-CT from 11/08/2017. Largest nodule was 1.1 cm in the LUL (SUV 1.9).  These demonstrated low-grade metabolic activity up to a maximum SUV of about 2.3, increased from 11/08/2017.    Case was discussed at tumor board on 04/25/2018. Consensus to pursue CT-guided biopsy (05/06/18) which revealed: Metastatic  adenocarcinoma, morphologically consistent with cervical adenocarcinoma.  She has history of chronic hepatitis C which is managed by GI.  Hepatitis C genotype is 2a/2c.  She receives B12 injections for history of B12 deficiency.  On 04/24/2018 she underwent left total knee replacement with Dr. [name]. --------------------------------------------------------   DIAGNOSIS: Cervical adenocarcinoma  STAGE: IV        ;GOALS: Palliative  CURRENT/MOST RECENT THERAPY : Alimta [C]    Malignant neoplasm of overlapping sites of cervix ([place])  01/29/2019 - 04/23/2019 Chemotherapy   The patient had PACLitaxel (TAXOL) 156 mg in sodium chloride 0.9 % 250 mL chemo infusion (</= 44m/m2), 80 mg/m2 = 156 mg, Intravenous,  Once, 3 of 4 cycles Dose modification: 65 mg/m2 (original dose 80 mg/m2, Cycle 1, Reason: Provider Judgment) Administration: 156 mg (01/29/2019), 156 mg (02/05/2019), 126 mg (02/12/2019), 126 mg (02/26/2019), 126 mg (03/13/2019), 126 mg (03/27/2019), 126 mg (03/20/2019), 126 mg (04/03/2019), 126 mg (04/10/2019)  for chemotherapy treatment.    05/08/2019 - 07/30/2019 Chemotherapy   The patient had pembrolizumab (KEYTRUDA) 200 mg in sodium chloride 0.9 % 50 mL chemo infusion, 200 mg, Intravenous, Once, 4 of 6 cycles Administration: 200 mg (05/08/2019), 200 mg (05/29/2019), 200 mg (06/19/2019), 200 mg (07/10/2019)  for chemotherapy treatment.    08/07/2019 -  Chemotherapy   The patient had pegfilgrastim-cbqv ([facility] injection 6 mg, 6 mg, Subcutaneous, Once, 2 of 5 cycles Administration: 6 mg (08/29/2019), 6 mg (09/19/2019) PEMEtrexed (ALIMTA) 1,000 mg in sodium chloride 0.9 % 100 mL chemo infusion, 950 mg, Intravenous,  Once, 3 of 6 cycles Administration: 1,000 mg (08/07/2019), 1,000 mg (09/18/2019), 1,000 mg (08/28/2019)  for chemotherapy treatment.      ALLERGIES:  is allergic to ketamine.  MEDICATIONS:  Current Outpatient Medications  Medication Sig Dispense Refill  . amitriptyline (ELAVIL) 75 MG  tablet Take 1 tablet (75 mg total) by mouth at bedtime. 90 tablet 1  . Calcium Carb-Cholecalciferol (CALCIUM 500 +D) 500-400 MG-UNIT TABS Take 2 tablets by mouth daily.    . cholestyramine (QUESTRAN) 4 g packet Take 1 packet (4 g total) by mouth 2 (two) times daily. 60 each 12  . diphenoxylate-atropine (LOMOTIL) 2.5-0.025 MG tablet Take 2 tablets by mouth 2 (two) times daily as needed for diarrhea or loose stools.    . famotidine (PEPCID) 20 MG tablet Take 1 tablet (20 mg total) by mouth 2 (two) times daily for 5 days. (Patient taking differently: Take 20 mg by mouth 2 (two) times daily as needed. ) 10 tablet 0  . folic acid (FOLVITE) 1 MG tablet Take 1 tablet (1 mg total) by mouth daily. 30 tablet 6  . hyoscyamine (LEVBID) 0.375 MG 12 hr tablet Take 1 tablet (0.375 mg total) by mouth 2 (two) times daily as needed for cramping.    .Marland KitchenoxyCODONE (OXYCONTIN) 20 mg 12 hr tablet Take 1 tablet (20 mg total) by mouth every 8 (eight) hours. 90 tablet 0  .  Oxycodone HCl 10 MG TABS Take 1-2 tablets (10-20 mg total) by mouth every 6 (six) hours as needed (for breakthrough pain). 120 tablet 0  . pantoprazole (PROTONIX) 40 MG tablet Take 1 tablet (40 mg total) by mouth daily. 90 tablet 1  . potassium chloride SA (KLOR-CON) 20 MEQ tablet Take 1 tablet (20 mEq total) by mouth 2 (two) times daily. 30 tablet 0  . promethazine (PHENERGAN) 25 MG tablet Take 1 tablet (25 mg total) by mouth every 8 (eight) hours as needed for nausea or vomiting. 30 tablet 6  . rivaroxaban (XARELTO) 20 MG TABS tablet Take 1 tablet (20 mg total) by mouth every morning. Takes in the morning    . sertraline (ZOLOFT) 25 MG tablet Take 1 tablet (25 mg total) by mouth daily. 30 tablet 0  . traMADol (ULTRAM) 50 MG tablet Take 50 mg by mouth every 6 (six) hours as needed.    . VENTOLIN HFA 108 (90 Base) MCG/ACT inhaler Inhale 1-2 puffs into the lungs every 4 (four) hours as needed for shortness of breath. 1 Inhaler 1   No current  facility-administered medications for this visit.   Facility-Administered Medications Ordered in Other Visits  Medication Dose Route Frequency Provider Last Rate Last Admin  . 0.9 %  sodium chloride infusion   Intravenous Once Charlaine Dalton R, MD      . heparin lock flush 100 unit/mL  500 Units Intravenous Once Corcoran, Melissa C, MD      . heparin lock flush 100 unit/mL  500 Units Intravenous Once Charlaine Dalton R, MD      . magnesium sulfate IVPB 4 g 100 mL  4 g Intravenous Once Brahmanday, Govinda R, MD      . sodium chloride 0.9 % 1,000 mL with potassium chloride 20 mEq infusion   Intravenous Once Honor Loh E, NP      . sodium chloride flush (NS) 0.9 % injection 10 mL  10 mL Intravenous Once Nassir Neidert, Vonna Kotyk R, NP      . sodium chloride flush (NS) 0.9 % injection 10 mL  10 mL Intravenous PRN Cammie Sickle, MD   10 mL at 10/08/19 0817    VITAL SIGNS: There were no vitals taken for this visit. There were no vitals filed for this visit.  Estimated body mass index is 26.06 kg/m as calculated from the following:   Height as of an earlier encounter on 10/08/19: 5' 8"  (1.727 m).   Weight as of an earlier encounter on 10/08/19: 171 lb 6.4 oz (77.7 kg).  LABS: CBC:    Component Value Date/Time   WBC 11.7 (H) 10/08/2019 0806   HGB 10.3 (L) 10/08/2019 0806   HCT 32.6 (L) 10/08/2019 0806   PLT 217 10/08/2019 0806   MCV 106.5 (H) 10/08/2019 0806   NEUTROABS 10.1 (H) 10/08/2019 0806   LYMPHSABS 0.7 10/08/2019 0806   MONOABS 0.5 10/08/2019 0806   EOSABS 0.0 10/08/2019 0806   BASOSABS 0.0 10/08/2019 0806   Comprehensive Metabolic Panel:    Component Value Date/Time   NA 137 10/08/2019 0806   K 4.3 10/08/2019 0806   CL 104 10/08/2019 0806   CO2 24 10/08/2019 0806   BUN 13 10/08/2019 0806   CREATININE 0.74 10/08/2019 0806   GLUCOSE 98 10/08/2019 0806   CALCIUM 9.2 10/08/2019 0806   AST 42 (H) 10/08/2019 0806   ALT 48 (H) 10/08/2019 0806   ALKPHOS 178 (H)  10/08/2019 0806   BILITOT 0.4 10/08/2019 1610  PROT 7.5 10/08/2019 0806   ALBUMIN 3.8 10/08/2019 0806    RADIOGRAPHIC STUDIES: DG Chest 2 View  Result Date: 09/25/2019 CLINICAL DATA:  History of metastatic cervical cancer. Chest pain, diarrhea, nausea and blood in stool. EXAM: CHEST - 2 VIEW COMPARISON:  CT chest 08/01/2019. Single-view of the chest 01/26/2019 FINDINGS: Scattered pulmonary nodules are seen but are better visualized on the prior CT scan. Lungs are otherwise clear without consolidative process or edema. No pneumothorax or pleural effusion. Heart size is normal. No acute or focal bony abnormality. Port-A-Cath is in place. IMPRESSION: No acute disease in patient with bilateral pulmonary nodules from cervical carcinoma as seen on prior CT. Electronically Signed   By: Inge Rise M.D.   On: 09/25/2019 08:44   CT Chest W Contrast  Result Date: 10/06/2019 CLINICAL DATA:  History of cervical neoplasm. EXAM: CT CHEST WITH CONTRAST TECHNIQUE: Multidetector CT imaging of the chest was performed during intravenous contrast administration. CONTRAST:  70m OMNIPAQUE IOHEXOL 300 MG/ML  SOLN COMPARISON:  Abdomen pelvis CT of 09/22/2019, CT chest 08/01/2019 FINDINGS: Cardiovascular: Scattered atherosclerosis in the thoracic aorta. Aorta is nondilated. Heart size is normal without pericardial effusion. Central pulmonary vasculature is normal. Right-sided Port-A-Cath remains in situ, terminating at the caval to atrial junction. Mediastinum/Nodes: No enlarged mediastinal, hilar, or axillary lymph nodes. Thyroid gland, trachea, and esophagus demonstrate no significant findings. Lungs/Pleura: Marked interval improvement of pulmonary lesions still with numerous lesions but with marked decrease in size. (Image 65, series 3) 5 mm predominantly cystic lesion with only thin wall remaining previously measured approximately 11 mm with a 4 mm thick wall surrounding an area of cavitation (Image 102, series 3)  5 mm nodule previously approximately 14 mm. Mainly a small cavitary focus on today's study, following other lesions seen on today's scan as compared to the prior. (Image 121, series 3) 5 mm lesion previously measuring approximately 11 mm. No signs of new lesion. Lesions are either diminished in size or in visible on today's study. There is some ground-glass opacity in the upper lobes. No dense consolidation. No significant interstitial thickening. Airways are patent. Upper Abdomen: Post cholecystectomy. Upper abdominal contents are incompletely imaged. Question of colitis seen on the prior study, not well evaluated on the current scan. No signs of ascites. No free air. Musculoskeletal: No acute bone finding or destructive bone process. IMPRESSION: 1. Marked interval improvement of pulmonary metastatic disease. 2. Question of colitis seen on the prior study, not well evaluated on the current scan. 3. Aortic atherosclerosis. 4. Right-sided Port-A-Cath remains in situ, terminating at the caval to atrial junction. Aortic Atherosclerosis (ICD10-I70.0). Electronically Signed   By: GZetta BillsM.D.   On: 10/06/2019 16:07   CT ABDOMEN PELVIS W CONTRAST  Result Date: 09/22/2019 CLINICAL DATA:  Acute abdominal pain EXAM: CT ABDOMEN AND PELVIS WITH CONTRAST TECHNIQUE: Multidetector CT imaging of the abdomen and pelvis was performed using the standard protocol following bolus administration of intravenous contrast. CONTRAST:  1033mOMNIPAQUE IOHEXOL 300 MG/ML  SOLN COMPARISON:  08/13/2019 FINDINGS: Lower chest: Again noted are multiple small cavitary lesions in the bilateral lower lobes, significantly improved from prior study.The heart size is normal. Hepatobiliary: The liver is normal. Status post cholecystectomy.There is intrahepatic and extrahepatic biliary ductal dilatation which is similar to prior study. Pancreas: Normal contours without ductal dilatation. No peripancreatic fluid collection. Spleen: No splenic  laceration or hematoma. Adrenals/Urinary Tract: --Adrenal glands: No adrenal hemorrhage. --Right kidney/ureter: No hydronephrosis or perinephric hematoma. --Left kidney/ureter: No hydronephrosis or  perinephric hematoma. --Urinary bladder: The urinary bladder is decompressed which limits evaluation. Stomach/Bowel: --Stomach/Duodenum: No hiatal hernia or other gastric abnormality. Normal duodenal course and caliber. --Small bowel: No dilatation or inflammation. --Colon: There is mild diffuse wall thickening throughout much of the colon, perhaps most evident at the splenic flexure. --Appendix: Surgically absent. Vascular/Lymphatic: Atherosclerotic calcification is present within the non-aneurysmal abdominal aorta, without hemodynamically significant stenosis. --No retroperitoneal lymphadenopathy. --No mesenteric lymphadenopathy. --No pelvic or inguinal lymphadenopathy. Reproductive: Unremarkable Other: No ascites or free air. The abdominal wall is normal. Musculoskeletal. There is stable asymmetric sclerosis involving the parasymphyseal left pubic bone. While this is stable from prior study, it is new from 01/23/2019. IMPRESSION: 1. Mild diffuse wall thickening throughout much of the colon, perhaps most evident at the splenic flexure. This could represent an infectious or inflammatory colitis. 2. Multiple small cavitary lesions in the bilateral lower lobes, significantly improved from prior study. These are consistent with improving metastatic lesions. 3. Sclerosis involving the parasymphyseal left pubic bone. This is new since April 2020. Findings may represent metastatic disease or a healing subacute fracture. Attention on follow-up examinations is recommended. Aortic Atherosclerosis (ICD10-I70.0). Electronically Signed   By: Constance Holster M.D.   On: 09/22/2019 19:29   DG Abd Acute W/Chest  Result Date: 09/16/2019 CLINICAL DATA:  Shortness of breath and abdominal pain. History of cervical carcinoma EXAM: DG  ABDOMEN ACUTE W/ 1V CHEST COMPARISON:  Chest CT August 01, 2019; abdominal radiographs July 23, 2019; CT abdomen and pelvis July 22, 2019 FINDINGS: PA chest: The cavitary nodular lesions seen on chest CT approximately 7 weeks prior are not appreciable by radiography. There is mild chronic blunting of the left costophrenic angle. No edema or consolidation evident by radiography. Heart size and pulmonary vascularity are normal. No adenopathy. Port-A-Cath tip is in the superior vena cava. Supine and upright abdomen: There is no bowel dilatation or air-fluid level to suggest bowel obstruction. No free air. Surgical clips are noted in the gallbladder fossa region. No blastic or lytic bone lesions. There is degenerative change in the lumbar spine with scoliosis. There are apparent phleboliths in the pelvis. IMPRESSION: No bowel obstruction or free air demonstrable. Surgical clips in gallbladder fossa region. No edema or consolidation. The nodular lesions seen on recent CT are not appreciable by radiography. Port-A-Cath tip in superior vena cava. Cardiac silhouette within normal limits. Electronically Signed   By: Lowella Grip III M.D.   On: 09/16/2019 07:53    PERFORMANCE STATUS (ECOG) : 2 - Symptomatic, <50% confined to bed  Review of Systems Unless otherwise noted, a complete review of systems is negative.  Physical Exam General: NAD, frail appearing, thin Pulmonary: unlabored Extremities: no edema, no joint deformities Skin: no rashes Neurological: Weakness but otherwise nonfocal  IMPRESSION: Patient returns to clinic today for follow-up.  She is accompanied by her mother.  Since discharging from the hospital on 09/29/2019, patient reports that she is doing better.  Pain, diarrhea, appetite, and nausea are all reportedly stable or improved.  CT of the chest on 10/06/2019 reveals marked interval improvement in pulmonary metastatic disease.  Patient is taking a week off from treatment as  she does not want to feel sick due to the Christmas holiday but then will resume treatment next week.  Case and plan discussed with Dr. Rogue Bussing.  PLAN: -Continue current scope of treatment -Continue OxyContin 20 mg every 8 hours -Continue oxycodone IR 10-20 mg every 6 hours as needed for breakthrough pain -Prophylactic bowel regimen -  Follow-up telephone visit in 3 to 4 weeks   Patient expressed understanding and was in agreement with this plan. She also understands that She can call the clinic at any time with any questions, concerns, or complaints.     Time Total: 15 minutes  Visit consisted of counseling and education dealing with the complex and emotionally intense issues of symptom management and palliative care in the setting of serious and potentially life-threatening illness.Greater than 50%  of this time was spent counseling and coordinating care related to the above assessment and plan.  Signed by: Altha Harm, PhD, NP-C

## 2019-10-08 NOTE — Assessment & Plan Note (Addendum)
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 CT chest- dec 21nd- Improved lung nodues.   #Patient tolerating Alimta with moderate-severe difficulties needing multiple admission to the hospital [see below].   #Given the significant improvement on cancer based on CT scan chest-I think is reasonable to continue current therapy at this time.  Patient understands pros and cons of continued chemotherapy; wants to continue for now.  Hold today's chemotherapy-given the holidays.  We will restart chemotherapy the first week of January.  Patient is in agreement.  # Left shoulder effusion/inflammatory arthritis/ ? Right knee fluid aspiration currently on "gout medication"/ colchcine.  Stable  # Adrenal insufficiency-on hydrocortisone 20 mg/day; hydrocortisone in PM.  Stable.  #Chronic diarrhea/constipation-recent colitis- recent antibiotics - improved.   #Peripheral neuropathy- stable;  Continue gabapentin.  # Hypomagnesemia-secondary platinum chemotherapy-today mag 1.4.  Proceed with magnesium.  #Chronic right lower quadrant abdominal pain/scar tissue versus others- currently on oxycodone.  Stable.  Discussed with Dr. Regenia Skeeter.  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  # DISPOSITION:  # HOLD chemo today; Mag 4 gm today.  # follow up in 1st week of Jan-MD-Alimta /cbc/cmp/mag/; Mag 4 gm/2 hour- Dr.B

## 2019-10-14 ENCOUNTER — Telehealth: Payer: Self-pay | Admitting: Internal Medicine

## 2019-10-14 ENCOUNTER — Inpatient Hospital Stay: Payer: Medicaid Other

## 2019-10-14 ENCOUNTER — Other Ambulatory Visit: Payer: Self-pay

## 2019-10-14 ENCOUNTER — Inpatient Hospital Stay (HOSPITAL_BASED_OUTPATIENT_CLINIC_OR_DEPARTMENT_OTHER): Payer: Medicaid Other | Admitting: Nurse Practitioner

## 2019-10-14 VITALS — BP 132/96 | HR 120 | Temp 97.2°F | Resp 22

## 2019-10-14 DIAGNOSIS — G893 Neoplasm related pain (acute) (chronic): Secondary | ICD-10-CM | POA: Diagnosis not present

## 2019-10-14 DIAGNOSIS — K529 Noninfective gastroenteritis and colitis, unspecified: Secondary | ICD-10-CM | POA: Diagnosis not present

## 2019-10-14 DIAGNOSIS — Z95828 Presence of other vascular implants and grafts: Secondary | ICD-10-CM

## 2019-10-14 DIAGNOSIS — R197 Diarrhea, unspecified: Secondary | ICD-10-CM

## 2019-10-14 DIAGNOSIS — R112 Nausea with vomiting, unspecified: Secondary | ICD-10-CM

## 2019-10-14 DIAGNOSIS — R1031 Right lower quadrant pain: Secondary | ICD-10-CM

## 2019-10-14 DIAGNOSIS — Z5111 Encounter for antineoplastic chemotherapy: Secondary | ICD-10-CM | POA: Diagnosis not present

## 2019-10-14 DIAGNOSIS — C538 Malignant neoplasm of overlapping sites of cervix uteri: Secondary | ICD-10-CM

## 2019-10-14 DIAGNOSIS — E2740 Unspecified adrenocortical insufficiency: Secondary | ICD-10-CM | POA: Diagnosis not present

## 2019-10-14 LAB — CBC WITH DIFFERENTIAL/PLATELET
Abs Immature Granulocytes: 0.11 10*3/uL — ABNORMAL HIGH (ref 0.00–0.07)
Basophils Absolute: 0 10*3/uL (ref 0.0–0.1)
Basophils Relative: 0 %
Eosinophils Absolute: 0.1 10*3/uL (ref 0.0–0.5)
Eosinophils Relative: 1 %
HCT: 40.8 % (ref 36.0–46.0)
Hemoglobin: 12.9 g/dL (ref 12.0–15.0)
Immature Granulocytes: 1 %
Lymphocytes Relative: 10 %
Lymphs Abs: 0.8 10*3/uL (ref 0.7–4.0)
MCH: 32.7 pg (ref 26.0–34.0)
MCHC: 31.6 g/dL (ref 30.0–36.0)
MCV: 103.6 fL — ABNORMAL HIGH (ref 80.0–100.0)
Monocytes Absolute: 0.8 10*3/uL (ref 0.1–1.0)
Monocytes Relative: 9 %
Neutro Abs: 7 10*3/uL (ref 1.7–7.7)
Neutrophils Relative %: 79 %
Platelets: 223 10*3/uL (ref 150–400)
RBC: 3.94 MIL/uL (ref 3.87–5.11)
RDW: 14.5 % (ref 11.5–15.5)
WBC: 8.9 10*3/uL (ref 4.0–10.5)
nRBC: 0 % (ref 0.0–0.2)

## 2019-10-14 LAB — COMPREHENSIVE METABOLIC PANEL
ALT: 71 U/L — ABNORMAL HIGH (ref 0–44)
AST: 56 U/L — ABNORMAL HIGH (ref 15–41)
Albumin: 4.2 g/dL (ref 3.5–5.0)
Alkaline Phosphatase: 225 U/L — ABNORMAL HIGH (ref 38–126)
Anion gap: 12 (ref 5–15)
BUN: 12 mg/dL (ref 6–20)
CO2: 24 mmol/L (ref 22–32)
Calcium: 9.8 mg/dL (ref 8.9–10.3)
Chloride: 98 mmol/L (ref 98–111)
Creatinine, Ser: 0.78 mg/dL (ref 0.44–1.00)
GFR calc Af Amer: >60 mL/min (ref >60)
GFR calc non Af Amer: >60 mL/min (ref >60)
Glucose, Bld: 104 mg/dL — ABNORMAL HIGH (ref 70–99)
Potassium: 3.5 mmol/L (ref 3.5–5.1)
Sodium: 134 mmol/L — ABNORMAL LOW (ref 135–145)
Total Bilirubin: 0.8 mg/dL (ref 0.3–1.2)
Total Protein: 8.6 g/dL — ABNORMAL HIGH (ref 6.5–8.1)

## 2019-10-14 LAB — MAGNESIUM: Magnesium: 1.5 mg/dL — ABNORMAL LOW (ref 1.7–2.4)

## 2019-10-14 MED ORDER — PROCHLORPERAZINE EDISYLATE 10 MG/2ML IJ SOLN
10.0000 mg | Freq: Once | INTRAMUSCULAR | Status: AC
  Administered 2019-10-14: 10 mg via INTRAVENOUS
  Filled 2019-10-14: qty 2

## 2019-10-14 MED ORDER — HEPARIN SOD (PORK) LOCK FLUSH 100 UNIT/ML IV SOLN
500.0000 [IU] | Freq: Once | INTRAVENOUS | Status: AC
  Administered 2019-10-14: 12:00:00 500 [IU] via INTRAVENOUS
  Filled 2019-10-14: qty 5

## 2019-10-14 MED ORDER — SODIUM CHLORIDE 0.9% FLUSH
10.0000 mL | Freq: Once | INTRAVENOUS | Status: AC
  Administered 2019-10-14: 10:00:00 10 mL via INTRAVENOUS
  Filled 2019-10-14: qty 10

## 2019-10-14 MED ORDER — MORPHINE SULFATE (PF) 2 MG/ML IV SOLN
2.0000 mg | Freq: Once | INTRAVENOUS | Status: AC
  Administered 2019-10-14: 12:00:00 2 mg via INTRAVENOUS
  Filled 2019-10-14: qty 1

## 2019-10-14 MED ORDER — SODIUM CHLORIDE 0.9 % IV SOLN
Freq: Once | INTRAVENOUS | Status: AC
  Filled 2019-10-14: qty 250

## 2019-10-14 NOTE — Progress Notes (Signed)
Symptom Management [place]  [phone] [phone]  Patient Care Team: Delsa Grana, PA-C as PCP - General (Family Medicine) Mellody Drown, MD as Consulting Physician (Obstetrics and Gynecology) [name], MD as Consulting Physician (Gastroenterology) Michael Boston, MD as Consulting Physician (General Surgery) Lovell Sheehan, MD as Consulting Physician (Orthopedic Surgery) Cammie Sickle, MD as Consulting Physician (Oncology) Earnestine Leys, MD as Consulting Physician (Orthopedic Surgery)   Name of the patient: Joanna Hall  [id number]  January 20, 1966   Date of visit: 10/14/19  Diagnosis-metastatic cervical cancer  Chief complaint/ Reason for visit-abdominal pain, vomiting, diarrhea  Heme/Onc history:  Oncology History Overview Note  # dec 2017- CERVICAL ADENO CA [[place]]; Stage IB [Dr. Christene Slates at [facility] in [place], [place];No surgery- Chemo-RT;   # 2018-sep - lung nodules [in [place]]  # AUG 20th, 2019-#1 Carbo-Taxol s/p 6 cycles- [No avastin sec to blood clots]: Feb 2020-bilateral lung nodules with 1 cm in size. March 2020- finished carbo-taxol #7; poor tolerance to [place].  # April 15 th 2020- Start Taxol weekly x 3; one week OFF;  # July 2020-CT- bil cavitary lesions- slightly bigger by few mm/overall STABLE; CT a/p-NED. 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  She received treatment from 11/2016-05/2017.  01/2017 cisplatin x 2 and carboplatin x 1 (01/29/2017) due to ARF and XRT.  XRT was followed by T & O on 02/01/2017 and T & N 02/10/2017 and 02/20/2017.  Course was complicated by 80 pound weight loss, nausea, vomiting, electrolyte wasting (potassium and magnesium).  She describes that.  Is been sick constantly requiring at least 20 hospitalizations.  Follow-up CT chest and PET on 06/2017. Per patient, 'radiation worked' and no disease in the abdomen.  At that time she was noted to have lung nodules that were growing and follow-up imaging was scheduled for 10/2017.  She was admitted to hospital in [place] for small bowel obstruction which was managed conservatively and she was home for a week prior to traveling to [place] for Thanksgiving holiday where she has family.  She presented to ER in [place] on 08/2017 with nausea, vomiting, and lower abdominal pain.  Symptoms did not respond to conservative treatment.   CT on 08/26/2017 revealed small bowel obstruction with transition in the pelvis just superior to the uterus rather was a long segment of distal ileum with fatty wall thickening compatible with chronic inflammation and/or radiation enteritis. Imaging showed numerous pulmonary nodules consistent with metastatic disease. She underwent laparotomy and right ileocolectomy on 08/31/2017 at [facility].  Surgical findings revealed a thickened, matted, and scarred piece of distal small bowel close to the ileocecal valve.  She was discharged on 09/05/2017.  Pain markedly increased in intensity and imaging was performed on 09/11/2017 which revealed: Debris within the anterior abdominal wall incision concerning for infection versus packing material, s/p post ileo-colectomy with expected postoperative changes, mild colonic ileus, numerous pulmonary nodules highly concerning for metastatic disease, punctate nonobstructing nephrolithiasis.  Staples were removed and one was packed.  She was started on doxycycline.  Abdominal and pelvic CT without contrast on 09/11/2017 revealed debris within anterior abdominal wall incision concerning for infection, versus packing material.   She is s/p ileocolectomy with expected postoperative changes and mild colonic ileus.  There were numerous pulmonary nodules highly concerning for metastatic disease and punctate nonobstructing nephrolithiasis. She was readmitted on 09/12/2017.  She describes the onset of lower abdominal pain on 09/09/2017.  Pain markedly increased in intensity on 09/11/2017.   Staples were removed and the wound packed.  She was started on doxycycline.  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   PET scan was ordered to evaluate enlarging lung nodules with concern for recurrent cervical cancer but scan was delayed due to insurance and need to be performed in [place].  Presented to ER on 12/03/2017 for abdominal pain and emesis.  Imaging concerning for worsening possible uterine infarct and she was admitted to hospital.  Pelvic MRI was unremarkable.  Remote scarring type changes of uterus thought to be possibly related to radiation.  She was discharged on 12/08/2017.  Underwent endoscopy and colonoscopy on 12/20/2017.    On 02/22/2018 she underwent laser ablation of condylomata around the anus and vagina under anesthesia with Dr. Johney Maine.   04/15/2018:  Chest, abdomen, and pelvis CT revealed innumerable (> 100) cavitary nodules scattered in the lungs, moderately enlarging compared to the 11/08/2017 PET-CT,  suspicious for metastatic disease.  One index node in the RLL measures 1.0 x 1.1 cm (previously 0.6 x 0.6 cm).  There were no new nodules.  There was an ill-defined wall thickening in the rectosigmoid with surrounding stranding along fascia planes, probably sequela from prior radiation therapy.  There was multilevel lumbar impingement due to spondylosis and degenerative disc disease.  There was heterogeneous enhancement in the uterus (some possibly from prior radiation therapy).   04/23/2018:  PET scan revealed numerous scattered solid and cavitary nodules in the lungs stable increased in size compared to the prior PET-CT from 11/08/2017. Largest nodule was 1.1 cm in the LUL (SUV 1.9).  These demonstrated low-grade metabolic activity up to a maximum SUV of about 2.3, increased from 11/08/2017.    Case was discussed at tumor board on 04/25/2018. Consensus to pursue CT-guided biopsy (05/06/18) which revealed: Metastatic adenocarcinoma, morphologically consistent with cervical adenocarcinoma.  She has history of chronic hepatitis C which is managed by GI.  Hepatitis C genotype is 2a/2c.  She receives B12 injections for history of B12 deficiency.  On 04/24/2018 she underwent left total knee replacement with Dr. [name]. --------------------------------------------------------   DIAGNOSIS: Cervical adenocarcinoma  STAGE: IV        ;GOALS: Palliative  CURRENT/MOST RECENT THERAPY : Alimta [C]    Malignant neoplasm of overlapping sites of cervix ([place])  01/29/2019 - 04/23/2019 Chemotherapy   The patient had PACLitaxel (TAXOL) 156 mg in sodium chloride 0.9 % 250 mL chemo infusion (</= 93m/m2), 80 mg/m2 = 156 mg, Intravenous,  Once, 3 of 4 cycles Dose modification: 65 mg/m2 (original dose 80 mg/m2, Cycle 1, Reason: Provider Judgment) Administration: 156 mg (01/29/2019), 156 mg (02/05/2019), 126 mg (02/12/2019), 126  mg (02/26/2019), 126 mg (03/13/2019), 126 mg (03/27/2019), 126 mg (03/20/2019), 126 mg (04/03/2019), 126 mg  (04/10/2019)  for chemotherapy treatment.    05/08/2019 - 07/30/2019 Chemotherapy   The patient had pembrolizumab (KEYTRUDA) 200 mg in sodium chloride 0.9 % 50 mL chemo infusion, 200 mg, Intravenous, Once, 4 of 6 cycles Administration: 200 mg (05/08/2019), 200 mg (05/29/2019), 200 mg (06/19/2019), 200 mg (07/10/2019)  for chemotherapy treatment.    08/07/2019 -  Chemotherapy   The patient had pegfilgrastim-cbqv [facility]) injection 6 mg, 6 mg, Subcutaneous, Once, 2 of 5 cycles Administration: 6 mg (08/29/2019), 6 mg (09/19/2019) PEMEtrexed (ALIMTA) 1,000 mg in sodium chloride 0.9 % 100 mL chemo infusion, 950 mg, Intravenous,  Once, 3 of 6 cycles Administration: 1,000 mg (08/07/2019), 1,000 mg (09/18/2019), 1,000 mg (08/28/2019)  for chemotherapy treatment.      Interval history-Joanna Hall, 53 year old female with above history of metastatic cervical cancer, followed by palliative care and receiving chemotherapy, presents to symptom management clinic for nausea, vomiting, diarrhea, and abdominal pain.  History of chronic colitis and adrenal insufficiency and has suffered similar symptoms previously.  She reports lower abdominal pain, primarily right lower quadrant, describes as sharp, intermittent, 8 of 10 in severity.  No radiating, alleviating, or aggravating factors.  Took her pain medication, antidiarrheals, and antiemetics, this morning but symptoms persisted.  Says that she vomited shortly after taking medications.  Denies bloody stools.  Says that she is having 'constant watery diarrhea' throughout the day and night for the last 24 hours.  Says she felt weak and lightheaded and feels that she needs IV fluids.  Reports compliance with hydrocortisone.  She denies dizziness or weakness.  Denies fever or illness, denies easy bleeding or bruising.  Reports appetite reduced but stable weight.  Denies chest pain.  Denies urinary complaints.  ECOG FS:2 - Symptomatic, <50% confined to bed  Review of  systems- Review of Systems  Constitutional: Positive for malaise/fatigue. Negative for chills, fever and weight loss.  HENT: Negative for hearing loss, nosebleeds, sore throat and tinnitus.   Eyes: Negative for blurred vision and double vision.  Respiratory: Negative for cough, hemoptysis, shortness of breath and wheezing.   Cardiovascular: Negative for chest pain, palpitations and leg swelling.  Gastrointestinal: Positive for abdominal pain, diarrhea, nausea and vomiting. Negative for blood in stool, constipation and melena.  Genitourinary: Negative for dysuria and urgency.  Musculoskeletal: Positive for back pain, joint pain and myalgias. Negative for falls.  Skin: Negative for itching and rash.  Neurological: Negative for dizziness, tingling, sensory change, loss of consciousness, weakness and headaches.  Endo/Heme/Allergies: Negative for environmental allergies. Does not bruise/bleed easily.  Psychiatric/Behavioral: Negative for depression. The patient is not nervous/anxious and does not have insomnia.     Current treatment-Alimta s/p cycle 3 on 09/18/2019 (cycle 4 delayed due to holidays and patient preference)  Allergies  Allergen Reactions  . Ketamine Anxiety and Other (See Comments)    Syncope episode/confusion     Past Medical History:  Diagnosis Date  . Abdominal pain 06/10/2018  . Abnormal cervical Papanicolaou smear 09/18/2017  . Anxiety   . Aortic atherosclerosis ([place])   . Arthritis    neck and knees  . 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 Blood transfusion without reported diagnosis   . Cervical cancer ([place]) 09/2016   mets lung  . Chronic anal fissure   . Chronic diarrhea   . Dyspnea   . Erosive gastropathy 09/18/2017  .  Factor V Leiden mutation ([place])   . Fecal incontinence   . Genital warts   . GERD (gastroesophageal reflux disease)   . GI bleed 10/08/2018  . Heart murmur   . Hematochezia   . Hemorrhoids   . 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 Hepatitis, chronic ([place]) 05/05/2017  . History of cancer chemotherapy    completed 06/2017  . History of Clostridium difficile infection    while undergoing chemo.  Negative test 10/2017  . Ileocolic anastomotic leak   . 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 Intestinal infection due to Clostridium difficile 09/18/2017  . Macrocytic anemia with vitamin B12 deficiency   . Multiple gastric ulcers   . Nausea vomiting and diarrhea   . Pancolitis ([place]) 07/27/2018  . Perianal condylomata   . Pneumonia    History of  . Pulmonary nodules   . Rectal bleeding   . Small bowel obstruction ([place]) 08/2017  . Stiff neck    limited right turn  . Vitamin D deficiency     Past Surgical History:  Procedure Laterality Date  . CHOLECYSTECTOMY    . COLON SURGERY  08/2017   resection  . COLONOSCOPY WITH PROPOFOL N/A 12/20/2017   Procedure: COLONOSCOPY WITH PROPOFOL;  Surgeon: [name], MD;  Location: [facility] ENDOSCOPY;  Service: Gastroenterology;  Laterality: N/A;  . COLONOSCOPY WITH PROPOFOL N/A 07/30/2018   Procedure: COLONOSCOPY WITH PROPOFOL;  Surgeon: [name], MD;  Location: [facility] ENDOSCOPY;  Service: Gastroenterology;  Laterality: N/A;  . COLONOSCOPY WITH PROPOFOL N/A 10/10/2018   Procedure: COLONOSCOPY WITH PROPOFOL;  Surgeon: Lucilla Lame, MD;  Location: [facility] ENDOSCOPY;  Service: Endoscopy;  Laterality: N/A;  . DIAGNOSTIC LAPAROSCOPY    . ESOPHAGOGASTRODUODENOSCOPY (EGD) WITH PROPOFOL N/A 12/20/2017   Procedure: ESOPHAGOGASTRODUODENOSCOPY (EGD) WITH PROPOFOL;  Surgeon: [name], MD;  Location: [place];  Service: Gastroenterology;  Laterality: N/A;  . ESOPHAGOGASTRODUODENOSCOPY (EGD) WITH PROPOFOL  07/30/2018   Procedure: ESOPHAGOGASTRODUODENOSCOPY (EGD) WITH PROPOFOL;  Surgeon: [name], MD;  Location: [facility] ENDOSCOPY;  Service: Gastroenterology;;  . Otho Darner SIGMOIDOSCOPY N/A 11/21/2018   Procedure:  FLEXIBLE SIGMOIDOSCOPY;  Surgeon: [name], MD;  Location: [facility] ENDOSCOPY;  Service: Gastroenterology;  Laterality: N/A;  . LAPAROTOMY N/A 08/31/2017   Procedure: EXPLORATORY LAPAROTOMY for SBO, ileocolectomy, removal of piece of uterine wall;  Surgeon: [name], MD;  Location: [facility] ORS;  Service: General;  Laterality: N/A;  . LASER ABLATION CONDOLAMATA N/A 02/22/2018   Procedure: LASER ABLATION/REMOVAL OF [id number] AROUND ANUS AND VAGINA;  Surgeon: Michael Boston, MD;  Location: [place];  Service: General;  Laterality: N/A;  . OOPHORECTOMY    . PORTA CATH INSERTION N/A 05/13/2018   Procedure: PORTA CATH INSERTION;  Surgeon: Katha Cabal, MD;  Location: [facility] CV LAB;  Service: Cardiovascular;  Laterality: N/A;  . SMALL INTESTINE SURGERY    . TANDEM RING INSERTION     x3  . THORACOTOMY    . TOTAL KNEE ARTHROPLASTY Left 04/24/2018   Procedure: TOTAL KNEE ARTHROPLASTY;  Surgeon: Lovell Sheehan, MD;  Location: [facility] ORS;  Service: Orthopedics;  Laterality: Left;    Social History   Socioeconomic History  . Marital status: Divorced    Spouse name: Not on file  . Number of children: Not on file  . Years of education: Not on file  . Highest education level: Not on file  Occupational History  .  Not on file  Tobacco Use  . Smoking status: Former Smoker    Packs/day: 0.25    Years: 10.00    Pack years: 2.50    Types: Cigarettes    Quit date: 10/16/2006    Years since quitting: 13.0  . Smokeless tobacco: Never Used  Substance and Sexual Activity  . Alcohol use: Not Currently    Comment: seldom  . Drug use: Yes    Types: Marijuana    Comment: not very often  . Sexual activity: Not Currently    Birth control/protection: Post-menopausal    Comment: Not Asked  Other Topics Concern  . Not on file  Social History Narrative  . Not on file   Social Determinants of Health   Financial Resource Strain:   . Difficulty of Paying Living  Expenses: Not on file  Food Insecurity:   . Worried About Charity fundraiser in the Last Year: Not on file  . Ran Out of Food in the Last Year: Not on file  Transportation Needs:   . Lack of Transportation (Medical): Not on file  . Lack of Transportation (Non-Medical): Not on file  Physical Activity:   . Days of Exercise per Week: Not on file  . Minutes of Exercise per Session: Not on file  Stress:   . Feeling of Stress : Not on file  Social Connections:   . Frequency of Communication with Friends and Family: Not on file  . Frequency of Social Gatherings with Friends and Family: Not on file  . Attends Religious Services: Not on file  . Active Member of Clubs or Organizations: Not on file  . Attends Archivist Meetings: Not on file  . Marital Status: Not on file  Intimate Partner Violence:   . Fear of Current or Ex-Partner: Not on file  . Emotionally Abused: Not on file  . Physically Abused: Not on file  . Sexually Abused: Not on file    Family History  Problem Relation Age of Onset  . Hypertension Mother   . Hypertension Father   . Diabetes Father   . Hyperlipidemia Father   . Alcohol abuse Daughter   . Hypertension Maternal Grandmother   . Diabetes Maternal Grandmother   . Diabetes Paternal Grandmother   . Hypertension Paternal Grandmother      Current Outpatient Medications:  .  amitriptyline (ELAVIL) 75 MG tablet, Take 1 tablet (75 mg total) by mouth at bedtime., Disp: 90 tablet, Rfl: 1 .  Calcium Carb-Cholecalciferol (CALCIUM 500 +D) 500-400 MG-UNIT TABS, Take 2 tablets by mouth daily., Disp: , Rfl:  .  cholestyramine (QUESTRAN) 4 g packet, Take 1 packet (4 g total) by mouth 2 (two) times daily., Disp: 60 each, Rfl: 12 .  diphenoxylate-atropine (LOMOTIL) 2.5-0.025 MG tablet, Take 2 tablets by mouth 2 (two) times daily as needed for diarrhea or loose stools., Disp: , Rfl:  .  famotidine (PEPCID) 20 MG tablet, Take 1 tablet (20 mg total) by mouth 2 (two)  times daily for 5 days. (Patient taking differently: Take 20 mg by mouth 2 (two) times daily as needed. ), Disp: 10 tablet, Rfl: 0 .  folic acid (FOLVITE) 1 MG tablet, Take 1 tablet (1 mg total) by mouth daily., Disp: 30 tablet, Rfl: 6 .  hyoscyamine (LEVBID) 0.375 MG 12 hr tablet, Take 1 tablet (0.375 mg total) by mouth 2 (two) times daily as needed for cramping., Disp: , Rfl:  .  oxyCODONE (OXYCONTIN) 20 mg 12 hr tablet,  Take 1 tablet (20 mg total) by mouth every 8 (eight) hours., Disp: 90 tablet, Rfl: 0 .  Oxycodone HCl 10 MG TABS, Take 1-2 tablets (10-20 mg total) by mouth every 6 (six) hours as needed (for breakthrough pain)., Disp: 120 tablet, Rfl: 0 .  pantoprazole (PROTONIX) 40 MG tablet, Take 1 tablet (40 mg total) by mouth daily., Disp: 90 tablet, Rfl: 1 .  potassium chloride SA (KLOR-CON) 20 MEQ tablet, Take 1 tablet (20 mEq total) by mouth 2 (two) times daily., Disp: 30 tablet, Rfl: 0 .  promethazine (PHENERGAN) 25 MG tablet, Take 1 tablet (25 mg total) by mouth every 8 (eight) hours as needed for nausea or vomiting., Disp: 30 tablet, Rfl: 6 .  rivaroxaban (XARELTO) 20 MG TABS tablet, Take 1 tablet (20 mg total) by mouth every morning. Takes in the morning, Disp: , Rfl:  .  sertraline (ZOLOFT) 25 MG tablet, Take 1 tablet (25 mg total) by mouth daily., Disp: 30 tablet, Rfl: 0 .  traMADol (ULTRAM) 50 MG tablet, Take 50 mg by mouth every 6 (six) hours as needed., Disp: , Rfl:  .  VENTOLIN HFA 108 (90 Base) MCG/ACT inhaler, Inhale 1-2 puffs into the lungs every 4 (four) hours as needed for shortness of breath., Disp: 1 Inhaler, Rfl: 1 No current facility-administered medications for this visit.  Facility-Administered Medications Ordered in Other Visits:  .  heparin lock flush 100 unit/mL, 500 Units, Intravenous, Once, Corcoran, Melissa C, MD .  sodium chloride 0.9 % 1,000 mL with potassium chloride 20 mEq infusion, , Intravenous, Once, Honor Loh E, NP .  sodium chloride flush (NS) 0.9 %  injection 10 mL, 10 mL, Intravenous, Once, Borders, Kirt Boys, NP  Physical exam:  Vitals:   10/14/19 1100  BP: (!) 132/96  Pulse: (!) 120  Resp: (!) 22  Temp: (!) 97.2 F (36.2 C)  TempSrc: Tympanic  SpO2: 98%   Physical Exam Constitutional:      Appearance: She is well-developed.  HENT:     Head: Atraumatic.     Nose: Nose normal.     Mouth/Throat:     Pharynx: No oropharyngeal exudate.  Eyes:     General: No scleral icterus.    Conjunctiva/sclera: Conjunctivae normal.  Cardiovascular:     Rate and Rhythm: Regular rhythm. Tachycardia present.  Pulmonary:     Effort: Pulmonary effort is normal. No respiratory distress.     Breath sounds: Normal breath sounds.  Abdominal:     General: Abdomen is flat. Bowel sounds are normal. There is no distension.     Palpations: Abdomen is soft. There is no shifting dullness.     Tenderness: There is abdominal tenderness in the right lower quadrant. There is no guarding or rebound.  Musculoskeletal:        General: Normal range of motion.     Cervical back: Normal range of motion and neck supple. Tenderness present.     Thoracic back: No tenderness.     Lumbar back: Tenderness present.  Skin:    General: Skin is warm and dry.  Neurological:     Mental Status: She is alert and oriented to person, place, and time.  Psychiatric:        Mood and Affect: Mood normal.        Behavior: Behavior normal.      CMP Latest Ref Rng & Units 10/14/2019  Glucose 70 - 99 mg/dL 104(H)  BUN 6 - 20 mg/dL 12  Creatinine 0.44 -  1.00 mg/dL 0.78  Sodium 135 - 145 mmol/L 134(L)  Potassium 3.5 - 5.1 mmol/L 3.5  Chloride 98 - 111 mmol/L 98  CO2 22 - 32 mmol/L 24  Calcium 8.9 - 10.3 mg/dL 9.8  Total Protein 6.5 - 8.1 g/dL 8.6(H)  Total Bilirubin 0.3 - 1.2 mg/dL 0.8  Alkaline Phos 38 - 126 U/L 225(H)  AST 15 - 41 U/L 56(H)  ALT 0 - 44 U/L 71(H)   CBC Latest Ref Rng & Units 10/14/2019  WBC 4.0 - 10.5 K/uL 8.9  Hemoglobin 12.0 - 15.0 g/dL 12.9    Hematocrit 36.0 - 46.0 % 40.8  Platelets 150 - 400 K/uL 223    No images are attached to the encounter.  DG Chest 2 View  Result Date: 09/25/2019 CLINICAL DATA:  History of metastatic cervical cancer. Chest pain, diarrhea, nausea and blood in stool. EXAM: CHEST - 2 VIEW COMPARISON:  CT chest 08/01/2019. Single-view of the chest 01/26/2019 FINDINGS: Scattered pulmonary nodules are seen but are better visualized on the prior CT scan. Lungs are otherwise clear without consolidative process or edema. No pneumothorax or pleural effusion. Heart size is normal. No acute or focal bony abnormality. Port-A-Cath is in place. IMPRESSION: No acute disease in patient with bilateral pulmonary nodules from cervical carcinoma as seen on prior CT. Electronically Signed   By: Inge Rise M.D.   On: 09/25/2019 08:44   CT Chest W Contrast  Result Date: 10/06/2019 CLINICAL DATA:  History of cervical neoplasm. EXAM: CT CHEST WITH CONTRAST TECHNIQUE: Multidetector CT imaging of the chest was performed during intravenous contrast administration. CONTRAST:  48m OMNIPAQUE IOHEXOL 300 MG/ML  SOLN COMPARISON:  Abdomen pelvis CT of 09/22/2019, CT chest 08/01/2019 FINDINGS: Cardiovascular: Scattered atherosclerosis in the thoracic aorta. Aorta is nondilated. Heart size is normal without pericardial effusion. Central pulmonary vasculature is normal. Right-sided Port-A-Cath remains in situ, terminating at the caval to atrial junction. Mediastinum/Nodes: No enlarged mediastinal, hilar, or axillary lymph nodes. Thyroid gland, trachea, and esophagus demonstrate no significant findings. Lungs/Pleura: Marked interval improvement of pulmonary lesions still with numerous lesions but with marked decrease in size. (Image 65, series 3) 5 mm predominantly cystic lesion with only thin wall remaining previously measured approximately 11 mm with a 4 mm thick wall surrounding an area of cavitation (Image 102, series 3) 5 mm nodule previously  approximately 14 mm. Mainly a small cavitary focus on today's study, following other lesions seen on today's scan as compared to the prior. (Image 121, series 3) 5 mm lesion previously measuring approximately 11 mm. No signs of new lesion. Lesions are either diminished in size or in visible on today's study. There is some ground-glass opacity in the upper lobes. No dense consolidation. No significant interstitial thickening. Airways are patent. Upper Abdomen: Post cholecystectomy. Upper abdominal contents are incompletely imaged. Question of colitis seen on the prior study, not well evaluated on the current scan. No signs of ascites. No free air. Musculoskeletal: No acute bone finding or destructive bone process. IMPRESSION: 1. Marked interval improvement of pulmonary metastatic disease. 2. Question of colitis seen on the prior study, not well evaluated on the current scan. 3. Aortic atherosclerosis. 4. Right-sided Port-A-Cath remains in situ, terminating at the caval to atrial junction. Aortic Atherosclerosis (ICD10-I70.0). Electronically Signed   By: GZetta BillsM.D.   On: 10/06/2019 16:07   CT ABDOMEN PELVIS W CONTRAST  Result Date: 09/22/2019 CLINICAL DATA:  Acute abdominal pain EXAM: CT ABDOMEN AND PELVIS WITH CONTRAST TECHNIQUE: Multidetector CT  imaging of the abdomen and pelvis was performed using the standard protocol following bolus administration of intravenous contrast. CONTRAST:  18m OMNIPAQUE IOHEXOL 300 MG/ML  SOLN COMPARISON:  08/13/2019 FINDINGS: Lower chest: Again noted are multiple small cavitary lesions in the bilateral lower lobes, significantly improved from prior study.The heart size is normal. Hepatobiliary: The liver is normal. Status post cholecystectomy.There is intrahepatic and extrahepatic biliary ductal dilatation which is similar to prior study. Pancreas: Normal contours without ductal dilatation. No peripancreatic fluid collection. Spleen: No splenic laceration or hematoma.  Adrenals/Urinary Tract: --Adrenal glands: No adrenal hemorrhage. --Right kidney/ureter: No hydronephrosis or perinephric hematoma. --Left kidney/ureter: No hydronephrosis or perinephric hematoma. --Urinary bladder: The urinary bladder is decompressed which limits evaluation. Stomach/Bowel: --Stomach/Duodenum: No hiatal hernia or other gastric abnormality. Normal duodenal course and caliber. --Small bowel: No dilatation or inflammation. --Colon: There is mild diffuse wall thickening throughout much of the colon, perhaps most evident at the splenic flexure. --Appendix: Surgically absent. Vascular/Lymphatic: Atherosclerotic calcification is present within the non-aneurysmal abdominal aorta, without hemodynamically significant stenosis. --No retroperitoneal lymphadenopathy. --No mesenteric lymphadenopathy. --No pelvic or inguinal lymphadenopathy. Reproductive: Unremarkable Other: No ascites or free air. The abdominal wall is normal. Musculoskeletal. There is stable asymmetric sclerosis involving the parasymphyseal left pubic bone. While this is stable from prior study, it is new from 01/23/2019. IMPRESSION: 1. Mild diffuse wall thickening throughout much of the colon, perhaps most evident at the splenic flexure. This could represent an infectious or inflammatory colitis. 2. Multiple small cavitary lesions in the bilateral lower lobes, significantly improved from prior study. These are consistent with improving metastatic lesions. 3. Sclerosis involving the parasymphyseal left pubic bone. This is new since April 2020. Findings may represent metastatic disease or a healing subacute fracture. Attention on follow-up examinations is recommended. Aortic Atherosclerosis (ICD10-I70.0). Electronically Signed   By: CConstance HolsterM.D.   On: 09/22/2019 19:29   DG Abd Acute W/Chest  Result Date: 09/16/2019 CLINICAL DATA:  Shortness of breath and abdominal pain. History of cervical carcinoma EXAM: DG ABDOMEN ACUTE W/ 1V  CHEST COMPARISON:  Chest CT August 01, 2019; abdominal radiographs July 23, 2019; CT abdomen and pelvis July 22, 2019 FINDINGS: PA chest: The cavitary nodular lesions seen on chest CT approximately 7 weeks prior are not appreciable by radiography. There is mild chronic blunting of the left costophrenic angle. No edema or consolidation evident by radiography. Heart size and pulmonary vascularity are normal. No adenopathy. Port-A-Cath tip is in the superior vena cava. Supine and upright abdomen: There is no bowel dilatation or air-fluid level to suggest bowel obstruction. No free air. Surgical clips are noted in the gallbladder fossa region. No blastic or lytic bone lesions. There is degenerative change in the lumbar spine with scoliosis. There are apparent phleboliths in the pelvis. IMPRESSION: No bowel obstruction or free air demonstrable. Surgical clips in gallbladder fossa region. No edema or consolidation. The nodular lesions seen on recent CT are not appreciable by radiography. Port-A-Cath tip in superior vena cava. Cardiac silhouette within normal limits. Electronically Signed   By: WLowella GripIII M.D.   On: 09/16/2019 07:53    Assessment and plan- Patient is a 53y.o. female diagnosed with metastatic cervical cancer currently on palliative alimta, who [facility] for complaints of abdominal pain, nausea & vomiting, and diarrhea.   Etiology of symptoms unclear. History significant for colitis and will check fecal calprotectin. Previous stool studies reviewed and were negative. Previously improved with antibiotics. Question adrenal insufficiency- currently  on hydrocortisone 20 mg/daily with option for 10 mg at night. Will check cortisol level tomorrow morning (fasting). Chronic right lower abdominal pain likely secondary to adhesions/scar tissue vs others. Pain medications managed by palliative care. Labs today overall stable. Alk phos and AST/ALT elevated. Hx of hep c.  Mag 1.5 - chronically low. Morphine 2 mg IV given in clinic for acute pain. IV fluids and compazine today. Continue follow up with PC.   Disposition:  RTC tomorrow for additional labs (9am fasting for cortisol) Follow up with Dr. Rogue Bussing as scheduled.  Follow up with Billey Chang, NP as scheduled.  Patient advised to notify the clinic if there is no improvement in symptoms or if symptoms worsen in next 3-4 days.    Visit Diagnosis 1. Colitis   2. Adrenal insufficiency (HCC)   3. Pain of metastatic malignancy    Patient expressed understanding and was in agreement with this plan. She also understands that She can call clinic at any time with any questions, concerns, or complaints.   Thank you for allowing me to participate in the care of this very pleasant patient.   Beckey Rutter, DNP, AGNP-C [facility] [place]

## 2019-10-14 NOTE — Telephone Encounter (Signed)
Apts made per md order

## 2019-10-14 NOTE — Telephone Encounter (Signed)
Patient called regarding worsening abdominal pain nausea vomiting diarrhea-needing evaluation in the symptom management clinic/IV fluids today.   Lauren/Jenny- TIA!

## 2019-10-15 ENCOUNTER — Other Ambulatory Visit: Payer: Self-pay

## 2019-10-15 ENCOUNTER — Inpatient Hospital Stay: Payer: Medicaid Other

## 2019-10-15 ENCOUNTER — Encounter: Payer: Self-pay | Admitting: Internal Medicine

## 2019-10-15 ENCOUNTER — Encounter: Payer: Self-pay | Admitting: Nurse Practitioner

## 2019-10-15 DIAGNOSIS — K529 Noninfective gastroenteritis and colitis, unspecified: Secondary | ICD-10-CM

## 2019-10-15 DIAGNOSIS — Z5111 Encounter for antineoplastic chemotherapy: Secondary | ICD-10-CM | POA: Diagnosis not present

## 2019-10-15 DIAGNOSIS — Z95828 Presence of other vascular implants and grafts: Secondary | ICD-10-CM

## 2019-10-15 DIAGNOSIS — E2740 Unspecified adrenocortical insufficiency: Secondary | ICD-10-CM

## 2019-10-15 LAB — CORTISOL: Cortisol, Plasma: 4.9 ug/dL

## 2019-10-15 MED ORDER — HEPARIN SOD (PORK) LOCK FLUSH 100 UNIT/ML IV SOLN
500.0000 [IU] | Freq: Once | INTRAVENOUS | Status: AC
  Administered 2019-10-15: 09:00:00 500 [IU] via INTRAVENOUS
  Filled 2019-10-15: qty 5

## 2019-10-15 MED ORDER — SODIUM CHLORIDE 0.9% FLUSH
10.0000 mL | Freq: Once | INTRAVENOUS | Status: AC
  Administered 2019-10-15: 10 mL via INTRAVENOUS
  Filled 2019-10-15: qty 10

## 2019-10-16 ENCOUNTER — Other Ambulatory Visit: Payer: Self-pay | Admitting: Internal Medicine

## 2019-10-16 LAB — CALPROTECTIN, FECAL: Calprotectin, Fecal: 76 ug/g (ref 0–120)

## 2019-10-17 ENCOUNTER — Encounter: Payer: Self-pay | Admitting: Internal Medicine

## 2019-10-18 ENCOUNTER — Emergency Department
Admission: EM | Admit: 2019-10-18 | Discharge: 2019-10-18 | Disposition: A | Discharge status: Home / Self Care | Payer: Medicaid Other | Attending: Emergency Medicine | Admitting: Emergency Medicine

## 2019-10-18 ENCOUNTER — Emergency Department: Payer: Medicaid Other

## 2019-10-18 ENCOUNTER — Other Ambulatory Visit: Payer: Self-pay

## 2019-10-18 DIAGNOSIS — U071 COVID-19: Secondary | ICD-10-CM | POA: Insufficient documentation

## 2019-10-18 DIAGNOSIS — Z96652 Presence of left artificial knee joint: Secondary | ICD-10-CM | POA: Diagnosis not present

## 2019-10-18 DIAGNOSIS — Z7901 Long term (current) use of anticoagulants: Secondary | ICD-10-CM | POA: Diagnosis not present

## 2019-10-18 DIAGNOSIS — E860 Dehydration: Secondary | ICD-10-CM | POA: Diagnosis not present

## 2019-10-18 DIAGNOSIS — D069 Carcinoma in situ of cervix, unspecified: Secondary | ICD-10-CM | POA: Insufficient documentation

## 2019-10-18 DIAGNOSIS — Z87891 Personal history of nicotine dependence: Secondary | ICD-10-CM | POA: Insufficient documentation

## 2019-10-18 DIAGNOSIS — I10 Essential (primary) hypertension: Secondary | ICD-10-CM | POA: Diagnosis not present

## 2019-10-18 DIAGNOSIS — Z79899 Other long term (current) drug therapy: Secondary | ICD-10-CM | POA: Diagnosis not present

## 2019-10-18 DIAGNOSIS — R0602 Shortness of breath: Secondary | ICD-10-CM | POA: Diagnosis present

## 2019-10-18 DIAGNOSIS — J111 Influenza due to unidentified influenza virus with other respiratory manifestations: Secondary | ICD-10-CM

## 2019-10-18 LAB — CBC
HCT: 36.5 % (ref 36.0–46.0)
Hemoglobin: 11.6 g/dL — ABNORMAL LOW (ref 12.0–15.0)
MCH: 32.8 pg (ref 26.0–34.0)
MCHC: 31.8 g/dL (ref 30.0–36.0)
MCV: 103.1 fL — ABNORMAL HIGH (ref 80.0–100.0)
Platelets: 167 10*3/uL (ref 150–400)
RBC: 3.54 MIL/uL — ABNORMAL LOW (ref 3.87–5.11)
RDW: 14.3 % (ref 11.5–15.5)
WBC: 4.8 10*3/uL (ref 4.0–10.5)
nRBC: 0 % (ref 0.0–0.2)

## 2019-10-18 LAB — BASIC METABOLIC PANEL
Anion gap: 11 (ref 5–15)
BUN: 9 mg/dL (ref 6–20)
CO2: 26 mmol/L (ref 22–32)
Calcium: 9.6 mg/dL (ref 8.9–10.3)
Chloride: 102 mmol/L (ref 98–111)
Creatinine, Ser: 0.74 mg/dL (ref 0.44–1.00)
GFR calc Af Amer: >60 mL/min (ref >60)
GFR calc non Af Amer: >60 mL/min (ref >60)
Glucose, Bld: 80 mg/dL (ref 70–99)
Potassium: 4 mmol/L (ref 3.5–5.1)
Sodium: 139 mmol/L (ref 135–145)

## 2019-10-18 LAB — POC SARS CORONAVIRUS 2 AG: SARS Coronavirus 2 Ag: POSITIVE — AB

## 2019-10-18 MED ORDER — ONDANSETRON 4 MG PO TBDP: 4.0000 mg | ORAL_TABLET | Freq: Three times a day (TID) | ORAL | 0 refills | Status: DC | PRN

## 2019-10-18 MED ORDER — HEPARIN SOD (PORK) LOCK FLUSH 10 UNIT/ML IV SOLN: 10.0000 [IU] | Freq: Once | INTRAVENOUS | Status: DC

## 2019-10-18 MED ORDER — KETOROLAC TROMETHAMINE 30 MG/ML IJ SOLN
15.0000 mg | INTRAMUSCULAR | Status: AC
  Administered 2019-10-18: 17:00:00 15 mg via INTRAVENOUS
  Filled 2019-10-18: qty 1

## 2019-10-18 MED ORDER — NAPROXEN 500 MG PO TABS: 500.0000 mg | ORAL_TABLET | Freq: Two times a day (BID) | ORAL | 0 refills | Status: DC

## 2019-10-18 MED ORDER — SODIUM CHLORIDE 0.9 % IV BOLUS
1000.0000 mL | Freq: Once | INTRAVENOUS | Status: AC
  Administered 2019-10-18: 1000 mL via INTRAVENOUS

## 2019-10-18 MED ORDER — FAMOTIDINE 20 MG PO TABS: 20.0000 mg | ORAL_TABLET | Freq: Two times a day (BID) | ORAL | 0 refills | Status: DC

## 2019-10-18 MED ORDER — HEPARIN SOD (PORK) LOCK FLUSH 10 UNIT/ML IV SOLN
10.0000 [IU] | Freq: Once | INTRAVENOUS | Status: DC
  Filled 2019-10-18: qty 1

## 2019-10-18 MED ORDER — HEPARIN SOD (PORK) LOCK FLUSH 100 UNIT/ML IV SOLN
INTRAVENOUS | Status: AC
  Administered 2019-10-18: 500 [IU]
  Filled 2019-10-18: qty 5

## 2019-10-18 MED ORDER — ONDANSETRON HCL 4 MG/2ML IJ SOLN
4.0000 mg | Freq: Once | INTRAMUSCULAR | Status: AC
  Administered 2019-10-18: 17:00:00 4 mg via INTRAVENOUS
  Filled 2019-10-18: qty 2

## 2019-10-18 NOTE — ED Notes (Signed)
Pt provided with water and saltine crackers.

## 2019-10-18 NOTE — ED Triage Notes (Signed)
Pt arrived via EMS from home d/t SOB and increased weakness x3days. Pt states her father was COVID positive as of yesterday. Pt has lung cancer and A Fib.

## 2019-10-18 NOTE — ED Provider Notes (Signed)
[facility] Emergency Department Provider Note  ____________________________________________  Time seen: Approximately 6:32 PM  I have reviewed the triage vital signs and the nursing notes.   HISTORY  Chief Complaint Shortness of Breath    HPI Joanna Hall is a 54 y.o. female with a history of VTE, cervical cancer with metastatic disease, factor V Leiden, GERD, on Eliquis  who comes the ED today due to shortness of breath and generalized weakness that is gradual onset, worsening and constant for the last 3 days, no aggravating or alleviating factors.  Her father was recently diagnosed with Covid.  She reports decreased appetite and decreased oral intake.  No dizziness or syncope.  No chest pain.  She does endorse body aches.     Past Medical History:  Diagnosis Date  . Abdominal pain 06/10/2018  . Abnormal cervical Papanicolaou smear 09/18/2017  . Anxiety   . Aortic atherosclerosis ([place])   . Arthritis    neck and knees  . 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 Blood transfusion without reported diagnosis   . Cervical cancer ([place]) 09/2016   mets lung  . Chronic anal fissure   . Chronic diarrhea   . Dyspnea   . Erosive gastropathy 09/18/2017  . Factor V Leiden mutation ([place])   . Fecal incontinence   . Genital warts   . GERD (gastroesophageal reflux disease)   . GI bleed 10/08/2018  . Heart murmur   . Hematochezia   . Hemorrhoids   . 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 Hepatitis, chronic ([place]) 05/05/2017  . History of cancer chemotherapy    completed 06/2017  . History of Clostridium difficile infection    while undergoing chemo.  Negative test 10/2017  . Ileocolic anastomotic leak   . Infarction of kidney ([place]) left kidney   and uterus  . Intestinal infection due to Clostridium difficile 09/18/2017  . Macrocytic anemia with vitamin B12 deficiency   . Multiple gastric ulcers   . Nausea vomiting  and diarrhea   . Pancolitis ([place]) 07/27/2018  . Perianal condylomata   . Pneumonia    History of  . Pulmonary nodules   . Rectal bleeding   . Small bowel obstruction ([place]) 08/2017  . Stiff neck    limited right turn  . Vitamin D deficiency      Patient Active Problem List   Diagnosis Date Noted  . Acute colitis 09/25/2019  . Anxiety and depression   . Thrombocytopenia ([place]) 08/17/2019  . Intractable lower abdominal pain   . Abdominal pain 07/23/2019  . Constipation 07/22/2019  . Malnutrition of moderate degree 06/22/2019  . Small bowel obstruction due to adhesions ([place]) 06/21/2019  . Acute gastroenteritis 06/14/2019  . Intractable nausea and vomiting 06/07/2019  . Chest pain 05/19/2019  . Intractable pain 05/19/2019  . Cancer associated pain   . Proctitis, radiation   . Palliative care encounter   . Encounter for monitoring opioid maintenance therapy 11/19/2018  . Adrenal insufficiency ([place]) 09/03/2018  . Nausea vomiting and diarrhea   . Chronic diarrhea 06/25/2018  . Chronic anticoagulation 06/25/2018  . Vomiting   . Encounter for antineoplastic chemotherapy 06/04/2018  . 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 Lung metastasis ([place]) 05/15/2018  . Closed fracture of distal end of radius 05/04/2018  . History of total knee arthroplasty 04/24/2018  . Osteoarthritis of left knee 02/28/2018  . Condyloma acuminatum  of anus s/p ablation 02/22/2018 02/22/2018  . Condyloma acuminatum of vagina s/p ablation 02/22/2018 02/22/2018  . Positive ANA (antinuclear antibody) 02/04/2018  . Aortic atherosclerosis ([place]) 01/15/2018  . Elevated MCV 01/15/2018  . Anemia 01/15/2018  . Genital warts 01/15/2018  . Vitamin D deficiency 01/15/2018  . Pernicious anemia   . Impingement syndrome of shoulder region 12/19/2017  . Multiple joint pain 12/19/2017  . Osteoarthritis of right knee 12/19/2017  . B12 deficiency 12/11/2017  . Lung nodules 12/11/2017  . History of Clostridium difficile  infection 10/23/2017  . Factor V Leiden ([place]) 10/19/2017  . Goals of care, counseling/discussion 10/19/2017  . Cervical cancer, FIGO stage IB1 ([place]) 09/18/2017  . Chronic hepatitis C without hepatic coma ([place]) 09/18/2017  . Cytopenia 09/18/2017  . Diarrhea 09/18/2017  . Elevated LFTs 09/18/2017  . Lower abdominal pain 09/18/2017  . 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 Malignant neoplasm of overlapping sites of cervix ([place]) 09/18/2017  . Renal insufficiency 09/18/2017  . Wound infection after surgery 09/12/2017  . Hypokalemia   . Hypomagnesemia   . Cervical arthritis 07/18/2017  . Dysuria 06/20/2017  . Metastasis from cervical cancer ([place]) 05/12/2017  . Essential hypertension 03/15/2017  . Anemia of chronic disease 03/01/2017  . Chemotherapy-induced neutropenia ([place]) 01/29/2017  . 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[place]) 09/25/2016     Past Surgical History:  Procedure Laterality Date  . CHOLECYSTECTOMY    . COLON SURGERY  08/2017   resection  . COLONOSCOPY WITH PROPOFOL N/A 12/20/2017   Procedure: COLONOSCOPY WITH PROPOFOL;  Surgeon: [name], MD;  Location: [facility] ENDOSCOPY;  Service: Gastroenterology;  Laterality: N/A;  . COLONOSCOPY WITH PROPOFOL N/A 07/30/2018   Procedure: COLONOSCOPY WITH PROPOFOL;  Surgeon: [name], MD;  Location: [facility] ENDOSCOPY;  Service: Gastroenterology;  Laterality: N/A;  . COLONOSCOPY WITH PROPOFOL N/A 10/10/2018   Procedure: COLONOSCOPY WITH PROPOFOL;  Surgeon: Lucilla Lame, MD;  Location: [facility] ENDOSCOPY;  Service: Endoscopy;  Laterality: N/A;  . DIAGNOSTIC LAPAROSCOPY    . ESOPHAGOGASTRODUODENOSCOPY (EGD) WITH PROPOFOL N/A 12/20/2017   Procedure: ESOPHAGOGASTRODUODENOSCOPY (EGD) WITH PROPOFOL;  Surgeon: [name], MD;  Location: [place];  Service: Gastroenterology;  Laterality: N/A;  . ESOPHAGOGASTRODUODENOSCOPY (EGD) WITH PROPOFOL  07/30/2018   Procedure: ESOPHAGOGASTRODUODENOSCOPY (EGD) WITH PROPOFOL;  Surgeon:  [name], MD;  Location: [facility] ENDOSCOPY;  Service: Gastroenterology;;  . Otho Darner SIGMOIDOSCOPY N/A 11/21/2018   Procedure: FLEXIBLE SIGMOIDOSCOPY;  Surgeon: [name], MD;  Location: [facility] ENDOSCOPY;  Service: Gastroenterology;  Laterality: N/A;  . LAPAROTOMY N/A 08/31/2017   Procedure: EXPLORATORY LAPAROTOMY for SBO, ileocolectomy, removal of piece of uterine wall;  Surgeon: [name], MD;  Location: [facility] ORS;  Service: General;  Laterality: N/A;  . LASER ABLATION CONDOLAMATA N/A 02/22/2018   Procedure: LASER ABLATION/REMOVAL OF VELFYBOFBPZ AROUND ANUS AND VAGINA;  Surgeon: Michael [place], MD;  Location: [place];  Service: General;  Laterality: N/A;  . OOPHORECTOMY    . PORTA CATH INSERTION N/A 05/13/2018   Procedure: PORTA CATH INSERTION;  Surgeon: Katha Cabal, MD;  Location: [place] CV LAB;  Service: Cardiovascular;  Laterality: N/A;  . SMALL INTESTINE SURGERY    . TANDEM RING INSERTION     x3  . THORACOTOMY    . TOTAL KNEE ARTHROPLASTY Left 04/24/2018   Procedure: TOTAL KNEE ARTHROPLASTY;  Surgeon: Lovell Sheehan, MD;  Location: [facility] ORS;  Service: Orthopedics;  Laterality: Left;     Prior to Admission medications   Medication Sig Start Date End  Date Taking? Authorizing Provider  amitriptyline (ELAVIL) 75 MG tablet Take 1 tablet (75 mg total) by mouth at bedtime. 05/06/19 10/07/22  [name], MD  Calcium Carb-Cholecalciferol (CALCIUM 500 +D) 500-400 MG-UNIT TABS Take 2 tablets by mouth daily.    [provider]  cholestyramine (QUESTRAN) 4 g packet Take 1 packet (4 g total) by mouth 2 (two) times daily. 09/29/19   Loletha Grayer, MD  diphenoxylate-atropine (LOMOTIL) 2.5-0.025 MG tablet Take 2 tablets by mouth 2 (two) times daily as needed for diarrhea or loose stools. 09/29/19   Loletha Grayer, MD  famotidine (PEPCID) 20 MG tablet Take 1 tablet (20 mg total) by mouth 2 (two) times daily. 10/18/19   Carrie Mew,  MD  folic acid (FOLVITE) 1 MG tablet Take 1 tablet (1 mg total) by mouth daily. 07/31/19   Cammie Sickle, MD  hydrOXYzine (ATARAX/VISTARIL) 10 MG tablet Take 1 tablet (10 mg total) by mouth 3 (three) times daily as needed. 10/16/19   Cammie Sickle, MD  hyoscyamine (LEVBID) 0.375 MG 12 hr tablet Take 1 tablet (0.375 mg total) by mouth 2 (two) times daily as needed for cramping. 09/29/19   Loletha Grayer, MD  ondansetron (ZOFRAN ODT) 4 MG disintegrating tablet Take 1 tablet (4 mg total) by mouth every 8 (eight) hours as needed for nausea or vomiting. 10/18/19   Carrie Mew, MD  oxyCODONE (OXYCONTIN) 20 mg 12 hr tablet Take 1 tablet (20 mg total) by mouth every 8 (eight) hours. 10/03/19   Borders, Kirt Boys, NP  Oxycodone HCl 10 MG TABS Take 1-2 tablets (10-20 mg total) by mouth every 6 (six) hours as needed (for breakthrough pain). 10/03/19   Borders, Kirt Boys, NP  pantoprazole (PROTONIX) 40 MG tablet Take 1 tablet (40 mg total) by mouth daily. 03/18/19   Cammie Sickle, MD  potassium chloride SA (KLOR-CON) 20 MEQ tablet Take 1 tablet (20 mEq total) by mouth 2 (two) times daily. 09/10/19   Verlon Au, NP  promethazine (PHENERGAN) 25 MG tablet Take 1 tablet (25 mg total) by mouth every 8 (eight) hours as needed for nausea or vomiting. 08/25/19   Cammie Sickle, MD  rivaroxaban (XARELTO) 20 MG TABS tablet Take 1 tablet (20 mg total) by mouth every morning. Takes in the morning 09/29/19   Loletha Grayer, MD  sertraline (ZOLOFT) 25 MG tablet Take 1 tablet (25 mg total) by mouth daily. 09/29/19   Loletha Grayer, MD  traMADol (ULTRAM) 50 MG tablet Take 50 mg by mouth every 6 (six) hours as needed.    [provider]  VENTOLIN HFA 108 (90 Base) MCG/ACT inhaler Inhale 1-2 puffs into the lungs every 4 (four) hours as needed for shortness of breath. 02/19/19   Verlon Au, NP     Allergies Ketamine   Family History  Problem Relation Age of Onset  .  Hypertension Mother   . Hypertension Father   . Diabetes Father   . Hyperlipidemia Father   . Alcohol abuse Daughter   . Hypertension Maternal Grandmother   . Diabetes Maternal Grandmother   . Diabetes Paternal Grandmother   . Hypertension Paternal Grandmother     Social History Social History   Tobacco Use  . Smoking status: Former Smoker    Packs/day: 0.25    Years: 10.00    Pack years: 2.50    Types: Cigarettes    Quit date: 10/16/2006    Years since quitting: 13.0  . Smokeless tobacco: Never Used  Substance Use Topics  . Alcohol use: Not Currently    Comment: seldom  . Drug use: Yes    Types: Marijuana    Comment: not very often    Review of Systems  Constitutional:   No fever positive chills.  ENT:   No sore throat. No rhinorrhea. Cardiovascular:   No chest pain or syncope. Respiratory: No shortness of breath and nonproductive cough. Gastrointestinal:   Negative for abdominal pain, vomiting and diarrhea.  Musculoskeletal:   Positive body aches All other systems reviewed and are negative except as documented above in ROS and HPI.  ____________________________________________   PHYSICAL EXAM:  VITAL SIGNS: ED Triage Vitals  Enc Vitals Group     BP 10/18/19 1247 (!) 125/95     Pulse Rate 10/18/19 1247 96     Resp 10/18/19 1247 16     Temp 10/18/19 1247 98.5 F (36.9 C)     Temp Source 10/18/19 1247 Oral     SpO2 10/18/19 1247 100 %     Weight 10/18/19 1249 168 lb (76.2 kg)     Height 10/18/19 1249 5' 7"  (1.702 m)     Head Circumference --      Peak Flow --      Pain Score 10/18/19 1249 7     Pain Loc --      Pain Edu? --      Excl. in [place]? --     Vital signs reviewed, nursing assessments reviewed.   Constitutional:   Alert and oriented. Non-toxic appearance. Eyes:   Conjunctivae are normal. EOMI. PERRL. ENT      Head:   Normocephalic and atraumatic.      Nose:   Wearing a mask.      Mouth/Throat:   Wearing a mask.      Neck:   No meningismus.  Full ROM. Hematological/Lymphatic/Immunilogical:   No cervical lymphadenopathy. Cardiovascular:   RRR. Symmetric bilateral radial and DP pulses.  No murmurs. Cap refill less than 2 seconds. Respiratory:   Normal respiratory effort without tachypnea/retractions. Breath sounds are clear and equal bilaterally. No wheezes/rales/rhonchi. Gastrointestinal:   Soft and nontender. Non distended. There is no CVA tenderness.  No rebound, rigidity, or guarding. Musculoskeletal:   Normal range of motion in all extremities. No joint effusions.  No lower extremity tenderness.  No edema. Neurologic:   Normal speech and language.  Motor grossly intact. No acute focal neurologic deficits are appreciated.  Skin:    Skin is warm, dry and intact. No rash noted.  No petechiae, purpura, or bullae.  ____________________________________________    LABS (pertinent positives/negatives) (all labs ordered are listed, but only abnormal results are displayed) Labs Reviewed  CBC - Abnormal; Notable for the following components:      Result Value   RBC 3.54 (*)    Hemoglobin 11.6 (*)    MCV 103.1 (*)    All other components within normal limits  POC SARS CORONAVIRUS 2 AG - Abnormal; Notable for the following components:   SARS Coronavirus 2 Ag POSITIVE (*)    All other components within normal limits  BASIC METABOLIC PANEL  POC SARS CORONAVIRUS 2 AG -  ED   ____________________________________________   EKG  Interpreted by me  Date: 10/18/2019  Rate: 86  Rhythm: normal sinus rhythm  QRS Axis: normal  Intervals: normal  ST/T Wave abnormalities: normal  Conduction Disutrbances: none  Narrative Interpretation: unremarkable      ____________________________________________    RADIOLOGY  DG Chest Portable  1 View  Result Date: 10/18/2019 CLINICAL DATA:  Weakness and shortness of breath EXAM: PORTABLE CHEST 1 VIEW COMPARISON:  10/06/2019 FINDINGS: Cardiac shadows within normal limits. Right-sided  chest wall port is again seen. Previously noted nodular changes in the lungs are less well appreciated on the current exam. No focal infiltrate or sizable effusion is seen. IMPRESSION: Previously seen pulmonary metastatic disease is less well visualized on the current study. No acute abnormality noted. Electronically Signed   By: Inez Catalina M.D.   On: 10/18/2019 14:40    ____________________________________________   PROCEDURES Procedures  ____________________________________________    CLINICAL IMPRESSION / ASSESSMENT AND PLAN / ED COURSE  Medications ordered in the ED: Medications  sodium chloride 0.9 % bolus 1,000 mL (0 mLs Intravenous Stopped 10/18/19 1831)  ondansetron (ZOFRAN) injection 4 mg (4 mg Intravenous Given 10/18/19 1634)  ketorolac (TORADOL) 30 MG/ML injection 15 mg (15 mg Intravenous Given 10/18/19 1634)    Pertinent labs & imaging results that were available during my care of the patient were reviewed by me and considered in my medical decision making (see chart for details).  Joanna Hall was evaluated in Emergency Department on 10/18/2019 for the symptoms described in the history of present illness. She was evaluated in the context of the global COVID-19 pandemic, which necessitated consideration that the patient might be at risk for infection with the SARS-CoV-2 virus that causes COVID-19. Institutional protocols and algorithms that pertain to the evaluation of patients at risk for COVID-19 are in a state of rapid change based on information released by regulatory bodies including the CDC and federal and state organizations. These policies and algorithms were followed during the patient's care in the ED.   Patient comes to ED due to generalized weakness shortness of breath and Covid exposure.  Her Covid test here is positive.  Her vital signs are normal, she is not in respiratory distress.  Her labs are reassuring.  Clinically she appears to be mildly dehydrated, so she  is given IV fluids Zofran and Toradol for symptom relief and hydration.  I will continue her on Pepcid and Zofran at home.  I did contact her oncologist as well, no further recommendations at this time.  She can continue all her home medications.  She will be contacted by the Covid follow-up team regarding if eligible for monoclonal antibody treatment.      ____________________________________________   FINAL CLINICAL IMPRESSION(S) / ED DIAGNOSES    Final diagnoses:  COVID-19 virus infection  Dehydration  Influenza-like illness     ED Discharge Orders         Ordered    naproxen (NAPROSYN) 500 MG tablet  2 times daily with meals,   Status:  Discontinued     10/18/19 1831    famotidine (PEPCID) 20 MG tablet  2 times daily,   Status:  Discontinued     10/18/19 1831    ondansetron (ZOFRAN ODT) 4 MG disintegrating tablet  Every 8 hours PRN     10/18/19 1831    famotidine (PEPCID) 20 MG tablet  2 times daily     10/18/19 1831          Portions of this note were generated with dragon dictation software. Dictation errors may occur despite best attempts at proofreading.   Carrie Mew, MD 10/18/19 [phone]

## 2019-10-19 ENCOUNTER — Telehealth: Payer: Self-pay | Admitting: Unknown Physician Specialty

## 2019-10-19 ENCOUNTER — Other Ambulatory Visit: Payer: Self-pay | Admitting: Unknown Physician Specialty

## 2019-10-19 DIAGNOSIS — U071 COVID-19: Secondary | ICD-10-CM

## 2019-10-19 NOTE — Telephone Encounter (Signed)
Called to discuss with patient about Covid symptoms and the use of bamlanivimab, a monoclonal antibody infusion for those with mild to moderate Covid symptoms and at a high risk of hospitalization.  Pt is qualified for this infusion at the [facility] infusion center due to immuno-compromised   Message left to call back

## 2019-10-19 NOTE — Telephone Encounter (Signed)
  I connected by phone with [name] on 10/19/2019 at 12:45 PM to discuss the potential use of an new treatment for mild to moderate COVID-19 viral infection in non-hospitalized patients.  This patient is a 54 y.o. female that meets the FDA criteria for Emergency Use Authorization of bamlanivimab or casirivimab\imdevimab.  Has a (+) direct SARS-CoV-2 viral test result  Has mild or moderate COVID-19   Is ? 54 years of age and weighs ? 40 kg  Is NOT hospitalized due to COVID-19  Is NOT requiring oxygen therapy or requiring an increase in baseline oxygen flow rate due to COVID-19  Is within 10 days of symptom onset  Has at least one of the high risk factor(s) for progression to severe COVID-19 and/or hospitalization as defined in EUA.  Specific high risk criteria : Immunosuppressive Disease   I have spoken and communicated the following to the patient or parent/caregiver:  1. FDA has authorized the emergency use of bamlanivimab and casirivimab\imdevimab for the treatment of mild to moderate COVID-19 in adults and pediatric patients with positive results of direct SARS-CoV-2 viral testing who are 93 years of age and older weighing at least 40 kg, and who are at high risk for progressing to severe COVID-19 and/or hospitalization.  2. The significant known and potential risks and benefits of bamlanivimab and casirivimab\imdevimab, and the extent to which such potential risks and benefits are unknown.  3. Information on available alternative treatments and the risks and benefits of those alternatives, including clinical trials.  4. Patients treated with bamlanivimab and casirivimab\imdevimab should continue to self-isolate and use infection control measures (e.g., wear mask, isolate, social distance, avoid sharing personal items, clean and disinfect "high touch" surfaces, and frequent handwashing) according to CDC guidelines.   5. The patient or parent/caregiver has the option to  accept or refuse bamlanivimab or casirivimab\imdevimab .  After reviewing this information with the patient, The patient agreed to proceed with receiving the bamlanimivab infusion and will be provided a copy of the Fact sheet prior to receiving the infusion.Kathrine Haddock 10/19/2019 12:45 PM

## 2019-10-20 ENCOUNTER — Encounter: Payer: Self-pay | Admitting: Internal Medicine

## 2019-10-20 ENCOUNTER — Other Ambulatory Visit: Payer: Self-pay | Admitting: Nurse Practitioner

## 2019-10-20 ENCOUNTER — Other Ambulatory Visit: Payer: Self-pay | Admitting: Gastroenterology

## 2019-10-20 DIAGNOSIS — K529 Noninfective gastroenteritis and colitis, unspecified: Secondary | ICD-10-CM

## 2019-10-20 NOTE — Progress Notes (Signed)
Not significantly elevated. Will plan to recheck in 4-6 weeks.

## 2019-10-21 ENCOUNTER — Ambulatory Visit (HOSPITAL_COMMUNITY)
Admission: RE | Admit: 2019-10-21 | Discharge: 2019-10-21 | Disposition: A | Discharge status: Home / Self Care | Payer: Medicaid Other | Source: Ambulatory Visit | Attending: Pulmonary Disease | Admitting: Pulmonary Disease

## 2019-10-21 DIAGNOSIS — Z23 Encounter for immunization: Secondary | ICD-10-CM | POA: Insufficient documentation

## 2019-10-21 DIAGNOSIS — U071 COVID-19: Secondary | ICD-10-CM | POA: Insufficient documentation

## 2019-10-21 MED ORDER — EPINEPHRINE 0.3 MG/0.3ML IJ SOAJ: 0.3000 mg | Freq: Once | INTRAMUSCULAR | Status: DC | PRN

## 2019-10-21 MED ORDER — SODIUM CHLORIDE 0.9 % IV SOLN
700.0000 mg | Freq: Once | INTRAVENOUS | Status: AC
  Administered 2019-10-21: 700 mg via INTRAVENOUS
  Filled 2019-10-21: qty 20

## 2019-10-21 MED ORDER — FAMOTIDINE IN NACL 20-0.9 MG/50ML-% IV SOLN: 20.0000 mg | Freq: Once | INTRAVENOUS | Status: DC | PRN

## 2019-10-21 MED ORDER — ALBUTEROL SULFATE HFA 108 (90 BASE) MCG/ACT IN AERS: 2.0000 | INHALATION_SPRAY | Freq: Once | RESPIRATORY_TRACT | Status: DC | PRN

## 2019-10-21 MED ORDER — SODIUM CHLORIDE 0.9 % IV SOLN: INTRAVENOUS | Status: DC | PRN

## 2019-10-21 MED ORDER — DIPHENHYDRAMINE HCL 50 MG/ML IJ SOLN: 50.0000 mg | Freq: Once | INTRAMUSCULAR | Status: DC | PRN

## 2019-10-21 MED ORDER — METHYLPREDNISOLONE SODIUM SUCC 125 MG IJ SOLR: 125.0000 mg | Freq: Once | INTRAMUSCULAR | Status: DC | PRN

## 2019-10-21 NOTE — Progress Notes (Signed)
  Diagnosis: COVID-19  Physician: Dr. Asencion Noble  Procedure: [facility] Med: bamlanivimab infusion - Provided patient with bamlanimivab fact sheet for patients, parents and caregivers prior to infusion.  Complications: No immediate complications noted.  Discharge: Discharged home   Gaye Alken 10/21/2019

## 2019-10-21 NOTE — Discharge Instructions (Signed)
What types of side effects do monoclonal antibody drugs cause?  Common side effects  In general, the more common side effects caused by monoclonal antibody drugs include: . Allergic reactions, such as hives or itching . Flu-like signs and symptoms, including chills, fatigue, fever, and muscle aches and pains . Nausea, vomiting . Diarrhea . Skin rashes . Low blood pressure   The CDC is recommending patients who receive monoclonal antibody treatments wait at least 90 days before being vaccinated.  Currently, there are no data on the safety and efficacy of mRNA COVID-19 vaccines in persons who received monoclonal antibodies or convalescent plasma as part of COVID-19 treatment. Based on the estimated half-life of such therapies as well as evidence suggesting that reinfection is uncommon in the 90 days after initial infection, vaccination should be deferred for at least 90 days, as a precautionary measure until additional information becomes available, to avoid interference of the antibody treatment with vaccine-induced immune responses.

## 2019-10-22 ENCOUNTER — Inpatient Hospital Stay: Payer: Medicaid Other

## 2019-10-22 ENCOUNTER — Inpatient Hospital Stay: Payer: Medicaid Other | Admitting: Internal Medicine

## 2019-10-23 ENCOUNTER — Inpatient Hospital Stay: Payer: Medicaid Other | Attending: Internal Medicine

## 2019-10-23 DIAGNOSIS — R918 Other nonspecific abnormal finding of lung field: Secondary | ICD-10-CM | POA: Insufficient documentation

## 2019-10-23 DIAGNOSIS — M19012 Primary osteoarthritis, left shoulder: Secondary | ICD-10-CM | POA: Insufficient documentation

## 2019-10-23 DIAGNOSIS — C7800 Secondary malignant neoplasm of unspecified lung: Secondary | ICD-10-CM | POA: Insufficient documentation

## 2019-10-23 DIAGNOSIS — Z86718 Personal history of other venous thrombosis and embolism: Secondary | ICD-10-CM | POA: Insufficient documentation

## 2019-10-23 DIAGNOSIS — Z86711 Personal history of pulmonary embolism: Secondary | ICD-10-CM | POA: Insufficient documentation

## 2019-10-23 DIAGNOSIS — Z9221 Personal history of antineoplastic chemotherapy: Secondary | ICD-10-CM | POA: Insufficient documentation

## 2019-10-23 DIAGNOSIS — G629 Polyneuropathy, unspecified: Secondary | ICD-10-CM | POA: Insufficient documentation

## 2019-10-23 DIAGNOSIS — Z8616 Personal history of COVID-19: Secondary | ICD-10-CM | POA: Insufficient documentation

## 2019-10-23 DIAGNOSIS — M19011 Primary osteoarthritis, right shoulder: Secondary | ICD-10-CM | POA: Insufficient documentation

## 2019-10-23 DIAGNOSIS — E2740 Unspecified adrenocortical insufficiency: Secondary | ICD-10-CM | POA: Insufficient documentation

## 2019-10-23 DIAGNOSIS — Z5111 Encounter for antineoplastic chemotherapy: Secondary | ICD-10-CM | POA: Insufficient documentation

## 2019-10-23 DIAGNOSIS — C539 Malignant neoplasm of cervix uteri, unspecified: Secondary | ICD-10-CM | POA: Insufficient documentation

## 2019-10-23 DIAGNOSIS — Z79899 Other long term (current) drug therapy: Secondary | ICD-10-CM | POA: Insufficient documentation

## 2019-10-23 DIAGNOSIS — Z7901 Long term (current) use of anticoagulants: Secondary | ICD-10-CM | POA: Insufficient documentation

## 2019-10-24 ENCOUNTER — Other Ambulatory Visit: Payer: Self-pay | Admitting: Gastroenterology

## 2019-10-29 ENCOUNTER — Inpatient Hospital Stay (HOSPITAL_BASED_OUTPATIENT_CLINIC_OR_DEPARTMENT_OTHER): Payer: Medicaid Other | Admitting: Hospice and Palliative Medicine

## 2019-10-29 DIAGNOSIS — Z515 Encounter for palliative care: Secondary | ICD-10-CM | POA: Diagnosis not present

## 2019-10-29 NOTE — Progress Notes (Signed)
Virtual Visit via Telephone Note  I connected with [name] on 10/29/19 at 10:30 AM EST by telephone and verified that I am speaking with the correct person using two identifiers.   I discussed the limitations, risks, security and privacy concerns of performing an evaluation and management service by telephone and the availability of in person appointments. I also discussed with the patient that there may be a patient responsible charge related to this service. The patient expressed understanding and agreed to proceed.   History of Present Illness: 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 Patient has multiple recent hospitalizations and ER visits for nausea and abdominal pain.    Observations/Objective: I called and spoke with patient by phone.  She was recently diagnosed with COVID-19.  Patient underwent monoclonal antibody infusion.  She says that she has recovered well and is essentially back to baseline.  Patient denies any significant issues or concerns today.  No distressing symptoms reported.  No issues with medications.  Oral intake is reportedly good.  Assessment and Plan: Stage IV cervical cancer -patient return to clinic on 1/20 to resume treatment.  She will see Dr. Rogue Bussing on that date.  COVID-19 -status post monoclonal antibody infusion.  Seems to be doing well from patient report.  Recommend quarantining for full 14 days.  Follow Up Instructions: Follow up telephone visit in 3-4 weeks   I discussed the assessment and treatment plan with the patient. The patient was provided an opportunity to ask questions and all were answered. The patient agreed with the plan and demonstrated an understanding of the  instructions.   The patient was advised to call back or seek an in-person evaluation if the symptoms worsen or if the condition fails to improve as anticipated.  I provided 5 minutes of non-face-to-face time during this encounter.   Irean Hong, NP

## 2019-10-30 ENCOUNTER — Encounter: Payer: Self-pay | Admitting: Oncology

## 2019-11-03 ENCOUNTER — Other Ambulatory Visit: Payer: Self-pay | Admitting: *Deleted

## 2019-11-03 ENCOUNTER — Encounter: Payer: Self-pay | Admitting: Oncology

## 2019-11-03 ENCOUNTER — Encounter: Payer: Self-pay | Admitting: Internal Medicine

## 2019-11-03 MED ORDER — OXYCODONE HCL ER 20 MG PO T12A: 20.0000 mg | EXTENDED_RELEASE_TABLET | Freq: Three times a day (TID) | ORAL | 0 refills | Status: DC

## 2019-11-03 MED ORDER — OXYCODONE HCL 10 MG PO TABS: 10.0000 mg | ORAL_TABLET | Freq: Four times a day (QID) | ORAL | 0 refills | Status: DC | PRN

## 2019-11-03 NOTE — Telephone Encounter (Signed)
On 1/15-spoke to patient regarding her left shoulder pain.  I would recommend arthroscopic surgery rather than shoulder replacement.  I worry the shoulder placement might take longer recovery to heal/and that might delay her chemotherapy.  Patient will follow up with me as planned/we will discuss further.

## 2019-11-03 NOTE — Telephone Encounter (Signed)
Patient requesting RF on narcotics.

## 2019-11-04 ENCOUNTER — Other Ambulatory Visit: Payer: Self-pay

## 2019-11-04 NOTE — Progress Notes (Signed)
Patient pre screened for office appointment, no questions or concerns today. Patient reminded of upcoming appointment time and date. Patient recovered from [place] ended 11/03/2019. Care team notified.

## 2019-11-05 ENCOUNTER — Inpatient Hospital Stay: Payer: Medicaid Other

## 2019-11-05 ENCOUNTER — Inpatient Hospital Stay (HOSPITAL_BASED_OUTPATIENT_CLINIC_OR_DEPARTMENT_OTHER): Payer: Medicaid Other | Admitting: Internal Medicine

## 2019-11-05 ENCOUNTER — Other Ambulatory Visit: Payer: Self-pay

## 2019-11-05 DIAGNOSIS — Z9221 Personal history of antineoplastic chemotherapy: Secondary | ICD-10-CM | POA: Diagnosis not present

## 2019-11-05 DIAGNOSIS — R918 Other nonspecific abnormal finding of lung field: Secondary | ICD-10-CM | POA: Diagnosis not present

## 2019-11-05 DIAGNOSIS — Z86711 Personal history of pulmonary embolism: Secondary | ICD-10-CM | POA: Diagnosis not present

## 2019-11-05 DIAGNOSIS — E2740 Unspecified adrenocortical insufficiency: Secondary | ICD-10-CM | POA: Diagnosis not present

## 2019-11-05 DIAGNOSIS — Z7901 Long term (current) use of anticoagulants: Secondary | ICD-10-CM | POA: Diagnosis not present

## 2019-11-05 DIAGNOSIS — Z8616 Personal history of COVID-19: Secondary | ICD-10-CM | POA: Diagnosis not present

## 2019-11-05 DIAGNOSIS — C538 Malignant neoplasm of overlapping sites of cervix uteri: Secondary | ICD-10-CM

## 2019-11-05 DIAGNOSIS — C7800 Secondary malignant neoplasm of unspecified lung: Secondary | ICD-10-CM | POA: Diagnosis not present

## 2019-11-05 DIAGNOSIS — Z5111 Encounter for antineoplastic chemotherapy: Secondary | ICD-10-CM | POA: Diagnosis present

## 2019-11-05 DIAGNOSIS — Z86718 Personal history of other venous thrombosis and embolism: Secondary | ICD-10-CM | POA: Diagnosis not present

## 2019-11-05 DIAGNOSIS — C539 Malignant neoplasm of cervix uteri, unspecified: Secondary | ICD-10-CM | POA: Diagnosis present

## 2019-11-05 DIAGNOSIS — M19011 Primary osteoarthritis, right shoulder: Secondary | ICD-10-CM | POA: Diagnosis not present

## 2019-11-05 DIAGNOSIS — M19012 Primary osteoarthritis, left shoulder: Secondary | ICD-10-CM | POA: Diagnosis not present

## 2019-11-05 DIAGNOSIS — G629 Polyneuropathy, unspecified: Secondary | ICD-10-CM | POA: Diagnosis not present

## 2019-11-05 DIAGNOSIS — Z79899 Other long term (current) drug therapy: Secondary | ICD-10-CM | POA: Diagnosis not present

## 2019-11-05 LAB — CBC WITH DIFFERENTIAL/PLATELET
Abs Immature Granulocytes: 0.03 10*3/uL (ref 0.00–0.07)
Basophils Absolute: 0 10*3/uL (ref 0.0–0.1)
Basophils Relative: 0 %
Eosinophils Absolute: 0 10*3/uL (ref 0.0–0.5)
Eosinophils Relative: 0 %
HCT: 40.1 % (ref 36.0–46.0)
Hemoglobin: 12.7 g/dL (ref 12.0–15.0)
Immature Granulocytes: 0 %
Lymphocytes Relative: 15 %
Lymphs Abs: 1.4 10*3/uL (ref 0.7–4.0)
MCH: 32.7 pg (ref 26.0–34.0)
MCHC: 31.7 g/dL (ref 30.0–36.0)
MCV: 103.4 fL — ABNORMAL HIGH (ref 80.0–100.0)
Monocytes Absolute: 0.5 10*3/uL (ref 0.1–1.0)
Monocytes Relative: 5 %
Neutro Abs: 7.6 10*3/uL (ref 1.7–7.7)
Neutrophils Relative %: 80 %
Platelets: 238 10*3/uL (ref 150–400)
RBC: 3.88 MIL/uL (ref 3.87–5.11)
RDW: 13.2 % (ref 11.5–15.5)
WBC: 9.5 10*3/uL (ref 4.0–10.5)
nRBC: 0 % (ref 0.0–0.2)

## 2019-11-05 LAB — COMPREHENSIVE METABOLIC PANEL
ALT: 51 U/L — ABNORMAL HIGH (ref 0–44)
AST: 40 U/L (ref 15–41)
Albumin: 4.3 g/dL (ref 3.5–5.0)
Alkaline Phosphatase: 230 U/L — ABNORMAL HIGH (ref 38–126)
Anion gap: 10 (ref 5–15)
BUN: 14 mg/dL (ref 6–20)
CO2: 26 mmol/L (ref 22–32)
Calcium: 9.5 mg/dL (ref 8.9–10.3)
Chloride: 99 mmol/L (ref 98–111)
Creatinine, Ser: 0.77 mg/dL (ref 0.44–1.00)
GFR calc Af Amer: >60 mL/min (ref >60)
GFR calc non Af Amer: >60 mL/min (ref >60)
Glucose, Bld: 135 mg/dL — ABNORMAL HIGH (ref 70–99)
Potassium: 4.2 mmol/L (ref 3.5–5.1)
Sodium: 135 mmol/L (ref 135–145)
Total Bilirubin: 0.5 mg/dL (ref 0.3–1.2)
Total Protein: 8.3 g/dL — ABNORMAL HIGH (ref 6.5–8.1)

## 2019-11-05 LAB — MAGNESIUM: Magnesium: 1.7 mg/dL (ref 1.7–2.4)

## 2019-11-05 MED ORDER — SODIUM CHLORIDE 0.9% FLUSH
10.0000 mL | Freq: Once | INTRAVENOUS | Status: AC
  Administered 2019-11-05: 09:00:00 10 mL via INTRAVENOUS
  Filled 2019-11-05: qty 10

## 2019-11-05 MED ORDER — HEPARIN SOD (PORK) LOCK FLUSH 100 UNIT/ML IV SOLN
500.0000 [IU] | Freq: Once | INTRAVENOUS | Status: AC
  Administered 2019-11-05: 500 [IU] via INTRAVENOUS
  Filled 2019-11-05: qty 5

## 2019-11-05 NOTE — Progress Notes (Signed)
[facility] NOTE  Patient Care Team: Joanna Hall as PCP - General (Family Medicine) Joanna Drown, MD as Consulting Physician (Obstetrics and Gynecology) Joanna Landsman, MD as Consulting Physician (Gastroenterology) Joanna Boston, MD as Consulting Physician (General Surgery) Joanna Sheehan, MD as Consulting Physician (Orthopedic Surgery) Joanna Sickle, MD as Consulting Physician (Oncology) Joanna Leys, MD as Consulting Physician (Orthopedic Surgery)  CHIEF COMPLAINTS/PURPOSE OF CONSULTATION: Cervical cancer  #  Oncology History Overview Note  # dec 2017- CERVICAL ADENO CA [Joanna Hall]; Stage IB [Dr. Christene [facility] at [facility] in [place], [place];No surgery- Chemo-RT;   # 2018-sep - lung nodules [in [place]]  # AUG 20th, 2019-#1 Carbo-Taxol s/p 6 cycles- [No avastin sec to blood clots]: Feb 2020-bilateral lung nodules with 1 cm in size. March 2020- finished carbo-taxol #7; poor tolerance to [place].  # April 15 th 2020- Start Taxol weekly x 3; one week OFF;  # July 2020-CT- bil cavitary lesions- slightly bigger by few mm/overall STABLE; CT a/p-NED. 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  She received treatment from 11/2016-05/2017.  01/2017 cisplatin x 2 and carboplatin x 1 (01/29/2017) due to ARF and XRT.  XRT was followed by T & O on 02/01/2017 and T & N 02/10/2017 and 02/20/2017.  Course  was complicated by 80 pound weight loss, nausea, vomiting, electrolyte wasting (potassium and magnesium).  She describes that.  Is been sick constantly requiring at least 20 hospitalizations.  Follow-up CT chest and PET on 06/2017. Per patient, 'radiation worked' and no disease in the abdomen.  At that time she was noted to have lung nodules that were growing and follow-up imaging was scheduled for 10/2017.  She was admitted to hospital in [place] for small bowel obstruction which was managed conservatively and she was home for a week prior to traveling to [place] for Thanksgiving holiday where she has family.  She presented to ER in [place] on 08/2017 with nausea, vomiting, and lower abdominal pain.  Symptoms did not respond to conservative treatment.   CT on 08/26/2017 revealed small bowel obstruction with transition in the pelvis just superior to the uterus rather was a long segment of distal ileum with fatty wall thickening compatible with chronic inflammation and/or radiation enteritis. Imaging showed numerous pulmonary nodules consistent with metastatic disease. She underwent laparotomy and right ileocolectomy on 08/31/2017 at [facility].  Surgical findings revealed a thickened, matted, and scarred piece of distal small bowel close to the ileocecal valve.  She was discharged on 09/05/2017.  Pain markedly increased in intensity and imaging was performed on 09/11/2017 which revealed: Debris within the anterior abdominal wall incision concerning for infection versus packing material, s/p post ileo-colectomy with expected postoperative changes, mild colonic ileus, numerous pulmonary nodules highly concerning for metastatic disease, punctate nonobstructing nephrolithiasis.  Staples were removed and one was packed.  She was started on doxycycline.  Abdominal and pelvic CT without contrast on 09/11/2017 revealed debris within anterior abdominal wall incision concerning  for infection, versus  packing material.   She is s/p ileocolectomy with expected postoperative changes and mild colonic ileus.  There were numerous pulmonary nodules highly concerning for metastatic disease and punctate nonobstructing nephrolithiasis. She was readmitted on 09/12/2017.  She describes the onset of lower abdominal pain on 09/09/2017.  Pain markedly increased in intensity on 09/11/2017.   Staples were removed and the wound packed.  She was started on doxycycline.  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   PET scan was ordered to evaluate enlarging lung nodules with concern for recurrent cervical cancer but scan was delayed due to insurance and need to be performed in [place].  Presented to ER on 12/03/2017 for abdominal pain and emesis.  Imaging concerning for worsening possible uterine infarct and she was admitted to hospital.  Pelvic MRI was unremarkable.  Remote scarring type changes of uterus thought to be possibly related to radiation.  She was discharged on 12/08/2017.  Underwent endoscopy and colonoscopy on 12/20/2017.    On 02/22/2018 she underwent laser ablation of condylomata around the anus and vagina under anesthesia with Dr. Johney Hall.   04/15/2018:  Chest, abdomen, and pelvis CT revealed innumerable (> 100) cavitary nodules scattered in the lungs, moderately enlarging compared to the 11/08/2017 PET-CT, suspicious for metastatic disease.  One index node in the RLL measures 1.0 x 1.1 cm (previously 0.6 x 0.6 cm).  There were no new nodules.  There was an ill-defined wall thickening in the rectosigmoid with surrounding  stranding along fascia planes, probably sequela from prior radiation therapy.  There was multilevel lumbar impingement due to spondylosis and degenerative disc disease.  There was heterogeneous enhancement in the uterus (some possibly from prior radiation therapy).   04/23/2018:  PET scan revealed numerous scattered solid and cavitary nodules in the lungs stable increased in size compared to the prior PET-CT from 11/08/2017. Largest nodule was 1.1 cm in the LUL (SUV 1.9).  These demonstrated low-grade metabolic activity up to a maximum SUV of about 2.3, increased from 11/08/2017.    Case was discussed at tumor board on 04/25/2018. Consensus to pursue CT-guided biopsy (05/06/18) which revealed: Metastatic adenocarcinoma, morphologically consistent with cervical adenocarcinoma.  She has history of chronic hepatitis C which is managed by GI.  Hepatitis C genotype is 2a/2c.  She receives B12 injections for history of B12 deficiency.  On 04/24/2018 she underwent left total knee replacement with Dr. [name]. --------------------------------------------------------   DIAGNOSIS: Cervical adenocarcinoma  STAGE: IV        ;GOALS: Palliative  CURRENT/MOST RECENT THERAPY : Alimta [C]    Malignant neoplasm of overlapping sites of cervix ([place])  01/29/2019 - 04/23/2019 Chemotherapy   The patient had PACLitaxel (TAXOL) 156 mg in sodium chloride 0.9 % 250 mL chemo infusion (</= 76m/m2), 80 mg/m2 = 156 mg, Intravenous,  Once, 3 of 4 cycles Dose modification: 65 mg/m2 (original dose 80 mg/m2, Cycle 1, Reason: Provider Judgment) Administration: 156 mg (01/29/2019), 156 mg (02/05/2019), 126 mg (02/12/2019), 126 mg (02/26/2019), 126 mg (03/13/2019), 126 mg (03/27/2019), 126 mg (03/20/2019), 126 mg (04/03/2019), 126 mg (04/10/2019)  for chemotherapy treatment.    05/08/2019 - 07/30/2019 Chemotherapy  The patient had pembrolizumab (KEYTRUDA) 200 mg in sodium chloride 0.9 % 50 mL chemo infusion, 200 mg, Intravenous, Once, 4 of 6  cycles Administration: 200 mg (05/08/2019), 200 mg (05/29/2019), 200 mg (06/19/2019), 200 mg (07/10/2019)  for chemotherapy treatment.    08/07/2019 -  Chemotherapy   The patient had pegfilgrastim-cbqv [facility]) injection 6 mg, 6 mg, Subcutaneous, Once, 2 of 5 cycles Administration: 6 mg (08/29/2019), 6 mg (09/19/2019) PEMEtrexed (ALIMTA) 1,000 mg in sodium chloride 0.9 % 100 mL chemo infusion, 950 mg, Intravenous,  Once, 3 of 6 cycles Administration: 1,000 mg (08/07/2019), 1,000 mg (09/18/2019), 1,000 mg (08/28/2019)  for chemotherapy treatment.       HISTORY OF PRESENTING ILLNESS:  Joanna Hall 54 y.o.  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  Patient was recently seen in the emergency room noted to have Covid positive.  Patient subsequently underwent monoclonal antibody infusion at [facility].  She states to have recovered.  Continues her chronic abdominal pain needing narcotic pain medication.  No diarrhea currently constipated.  No fever no chills.   Patient's biggest concern is her bilateral shoulder pain.-She complains of left shoulder pain quite intense.  She has been evaluated by orthopedics Dr. Westley Foots shoulder replacement.  Review of Systems  Constitutional: Positive for malaise/fatigue. Negative for chills, diaphoresis, fever and weight loss.  HENT: Negative for nosebleeds and sore throat.   Eyes: Negative for double vision.  Respiratory: Negative for cough, hemoptysis, sputum production, shortness of breath and wheezing.   Cardiovascular: Negative for chest pain, palpitations, orthopnea and leg swelling.  Gastrointestinal: Positive for abdominal pain and constipation. Negative for blood in stool, heartburn, melena, nausea and vomiting.  Genitourinary: Negative for dysuria, frequency and urgency.  Musculoskeletal: Positive for back  pain and joint pain.  Skin: Negative.  Negative for itching and rash.  Neurological: Positive for tingling. Negative for dizziness, focal weakness, weakness and headaches.  Endo/Heme/Allergies: Does not bruise/bleed easily.  Psychiatric/Behavioral: Negative for depression. The patient is not nervous/anxious and does not have insomnia.      MEDICAL HISTORY:  Past Medical History:  Diagnosis Date  . Abdominal pain 06/10/2018  . Abnormal cervical Papanicolaou smear 09/18/2017  . Anxiety   . Aortic atherosclerosis ([place])   . Arthritis    neck and knees  . 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 Blood transfusion without reported diagnosis   . Cervical cancer ([place]) 09/2016   mets lung  . Chronic anal fissure   . Chronic diarrhea   . Dyspnea   . Erosive gastropathy 09/18/2017  . Factor V Leiden mutation ([place])   . Fecal incontinence   . Genital warts   . GERD (gastroesophageal reflux disease)   . GI bleed 10/08/2018  . Heart murmur   . Hematochezia   . Hemorrhoids   . 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 Hepatitis, chronic ([place]) 05/05/2017  . History of cancer chemotherapy    completed 06/2017  . History of Clostridium difficile infection    while undergoing chemo.  Negative test 10/2017  . Ileocolic anastomotic leak   . 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 Intestinal infection due to Clostridium difficile 09/18/2017  . Macrocytic anemia with vitamin B12 deficiency   . Multiple gastric ulcers   .  Nausea vomiting and diarrhea   . Pancolitis ([place]) 07/27/2018  . Perianal condylomata   . Pneumonia    History of  . Pulmonary nodules   . Rectal bleeding   . Small bowel obstruction ([place]) 08/2017  . Stiff neck    limited right turn  . Vitamin D deficiency     SURGICAL HISTORY: Past Surgical History:  Procedure Laterality Date  . CHOLECYSTECTOMY    . COLON SURGERY  08/2017   resection  . COLONOSCOPY WITH PROPOFOL  N/A 12/20/2017   Procedure: COLONOSCOPY WITH PROPOFOL;  Surgeon: Joanna Landsman, MD;  Location: [facility] ENDOSCOPY;  Service: Gastroenterology;  Laterality: N/A;  . COLONOSCOPY WITH PROPOFOL N/A 07/30/2018   Procedure: COLONOSCOPY WITH PROPOFOL;  Surgeon: Joanna Landsman, MD;  Location: [facility] ENDOSCOPY;  Service: Gastroenterology;  Laterality: N/A;  . COLONOSCOPY WITH PROPOFOL N/A 10/10/2018   Procedure: COLONOSCOPY WITH PROPOFOL;  Surgeon: Lucilla Lame, MD;  Location: [facility] ENDOSCOPY;  Service: Endoscopy;  Laterality: N/A;  . DIAGNOSTIC LAPAROSCOPY    . ESOPHAGOGASTRODUODENOSCOPY (EGD) WITH PROPOFOL N/A 12/20/2017   Procedure: ESOPHAGOGASTRODUODENOSCOPY (EGD) WITH PROPOFOL;  Surgeon: Joanna Landsman, MD;  Location: [facility];  Service: Gastroenterology;  Laterality: N/A;  . ESOPHAGOGASTRODUODENOSCOPY (EGD) WITH PROPOFOL  07/30/2018   Procedure: ESOPHAGOGASTRODUODENOSCOPY (EGD) WITH PROPOFOL;  Surgeon: Joanna Landsman, MD;  Location: [facility] ENDOSCOPY;  Service: Gastroenterology;;  . Otho Darner SIGMOIDOSCOPY N/A 11/21/2018   Procedure: FLEXIBLE SIGMOIDOSCOPY;  Surgeon: Joanna Landsman, MD;  Location: [facility] ENDOSCOPY;  Service: Gastroenterology;  Laterality: N/A;  . LAPAROTOMY N/A 08/31/2017   Procedure: EXPLORATORY LAPAROTOMY for SBO, ileocolectomy, removal of piece of uterine wall;  Surgeon: [name], MD;  Location: [facility] ORS;  Service: General;  Laterality: N/A;  . LASER ABLATION CONDOLAMATA N/A 02/22/2018   Procedure: LASER ABLATION/REMOVAL OF [id number] AROUND ANUS AND VAGINA;  Surgeon: Joanna Boston, MD;  Location: [facility];  Service: General;  Laterality: N/A;  . OOPHORECTOMY    . PORTA CATH INSERTION N/A 05/13/2018   Procedure: PORTA CATH INSERTION;  Surgeon: Katha Cabal, MD;  Location: [facility] CV LAB;  Service: Cardiovascular;  Laterality: N/A;  . SMALL INTESTINE SURGERY    . TANDEM RING INSERTION     x3  . THORACOTOMY    . TOTAL KNEE  ARTHROPLASTY Left 04/24/2018   Procedure: TOTAL KNEE ARTHROPLASTY;  Surgeon: Joanna Sheehan, MD;  Location: [facility] ORS;  Service: Orthopedics;  Laterality: Left;    SOCIAL HISTORY: Social History   Socioeconomic History  . Marital status: Divorced    Spouse name: Not on file  . Number of children: Not on file  . Years of education: Not on file  . Highest education level: Not on file  Occupational History  . Not on file  Tobacco Use  . Smoking status: Former Smoker    Packs/day: 0.25    Years: 10.00    Pack years: 2.50    Types: Cigarettes    Quit date: 10/16/2006    Years since quitting: 13.0  . Smokeless tobacco: Never Used  Substance and Sexual Activity  . Alcohol use: Not Currently    Comment: seldom  . Drug use: Yes    Types: Marijuana    Comment: not very often  . Sexual activity: Not Currently    Birth control/protection: Post-menopausal    Comment: Not Asked  Other Topics Concern  . Not on file  Social History Narrative  . Not on file   Social Determinants of Health  Financial Resource Strain:   . Difficulty of Paying Living Expenses: Not on file  Food Insecurity:   . Worried About Charity fundraiser in the Last Year: Not on file  . Ran Out of Food in the Last Year: Not on file  Transportation Needs:   . Lack of Transportation (Medical): Not on file  . Lack of Transportation (Non-Medical): Not on file  Physical Activity:   . Days of Exercise per Week: Not on file  . Minutes of Exercise per Session: Not on file  Stress:   . Feeling of Stress : Not on file  Social Connections:   . Frequency of Communication with Friends and Family: Not on file  . Frequency of Social Gatherings with Friends and Family: Not on file  . Attends Religious Services: Not on file  . Active Member of Clubs or Organizations: Not on file  . Attends Archivist Meetings: Not on file  . Marital Status: Not on file  Intimate Partner Violence:   . Fear of Current or  Ex-Partner: Not on file  . Emotionally Abused: Not on file  . Physically Abused: Not on file  . Sexually Abused: Not on file    FAMILY HISTORY: Family History  Problem Relation Age of Onset  . Hypertension Mother   . Hypertension Father   . Diabetes Father   . Hyperlipidemia Father   . Alcohol abuse Daughter   . Hypertension Maternal Grandmother   . Diabetes Maternal Grandmother   . Diabetes Paternal Grandmother   . Hypertension Paternal Grandmother     ALLERGIES:  is allergic to ketamine.  MEDICATIONS:  Current Outpatient Medications  Medication Sig Dispense Refill  . amitriptyline (ELAVIL) 75 MG tablet Take 1 tablet (75 mg total) by mouth at bedtime. 90 tablet 1  . Calcium Carb-Cholecalciferol (CALCIUM 500 +D) 500-400 MG-UNIT TABS Take 2 tablets by mouth daily.    . cholestyramine (QUESTRAN) 4 g packet Take 1 packet (4 g total) by mouth 2 (two) times daily. 60 each 12  . diphenoxylate-atropine (LOMOTIL) 2.5-0.025 MG tablet Take 2 tablets by mouth 2 (two) times daily as needed for diarrhea or loose stools.    . famotidine (PEPCID) 20 MG tablet Take 1 tablet (20 mg total) by mouth 2 (two) times daily. 60 tablet 0  . folic acid (FOLVITE) 1 MG tablet Take 1 tablet (1 mg total) by mouth daily. 30 tablet 6  . hydrocortisone (CORTEF) 10 MG tablet Take 1 tablet (10 mg total) by mouth 2 (two) times daily. 180 tablet 0  . hydrOXYzine (ATARAX/VISTARIL) 10 MG tablet Take 1 tablet (10 mg total) by mouth 3 (three) times daily as needed. 60 tablet 0  . hyoscyamine (LEVBID) 0.375 MG 12 hr tablet Take 1 tablet (0.375 mg total) by mouth 2 (two) times daily as needed for cramping.    . ondansetron (ZOFRAN ODT) 4 MG disintegrating tablet Take 1 tablet (4 mg total) by mouth every 8 (eight) hours as needed for nausea or vomiting. 20 tablet 0  . oxyCODONE (OXYCONTIN) 20 mg 12 hr tablet Take 1 tablet (20 mg total) by mouth every 8 (eight) hours. 90 tablet 0  . Oxycodone HCl 10 MG TABS Take 1-2 tablets  (10-20 mg total) by mouth every 6 (six) hours as needed (for breakthrough pain). 120 tablet 0  . pantoprazole (PROTONIX) 40 MG tablet Take 1 tablet (40 mg total) by mouth daily. 90 tablet 1  . potassium chloride SA (KLOR-CON) 20 MEQ tablet Take  1 tablet (20 mEq total) by mouth 2 (two) times daily. 30 tablet 0  . promethazine (PHENERGAN) 25 MG tablet Take 1 tablet (25 mg total) by mouth every 8 (eight) hours as needed for nausea or vomiting. 30 tablet 6  . rivaroxaban (XARELTO) 20 MG TABS tablet Take 1 tablet (20 mg total) by mouth every morning. Takes in the morning    . sertraline (ZOLOFT) 25 MG tablet Take 1 tablet (25 mg total) by mouth daily. 30 tablet 0  . traMADol (ULTRAM) 50 MG tablet Take 50 mg by mouth every 6 (six) hours as needed.    . VENTOLIN HFA 108 (90 Base) MCG/ACT inhaler Inhale 1-2 puffs into the lungs every 4 (four) hours as needed for shortness of breath. 1 Inhaler 1   No current facility-administered medications for this visit.   Facility-Administered Medications Ordered in Other Visits  Medication Dose Route Frequency Provider Last Rate Last Admin  . heparin lock flush 100 unit/mL  500 Units Intravenous Once Corcoran, Melissa C, MD      . sodium chloride 0.9 % 1,000 mL with potassium chloride 20 mEq infusion   Intravenous Once Honor Loh E, NP      . sodium chloride flush (NS) 0.9 % injection 10 mL  10 mL Intravenous Once Borders, Kirt Boys, NP          .  PHYSICAL EXAMINATION: ECOG PERFORMANCE STATUS: 1 - Symptomatic but completely ambulatory  Vitals:   11/05/19 0908 11/05/19 0949  BP: (!) 153/103   Pulse: (!) 120 (!) 110  Temp: (!) 95 F (35 C)    Filed Weights   11/05/19 0908  Weight: 168 lb (76.2 kg)    Physical Exam  Constitutional: She is oriented to person, place, and time and well-developed, well-nourished, and in no distress.  Alone.  Walking herself.   HENT:  Head: Normocephalic and atraumatic.  Mouth/Throat: Oropharynx is clear and moist. No  oropharyngeal exudate.  Eyes: Pupils are equal, round, and reactive to light.  Cardiovascular: Normal rate and regular rhythm.  Pulmonary/Chest: Effort normal and breath sounds normal. No respiratory distress. She has no wheezes.  Abdominal: Soft. Bowel sounds are normal. She exhibits no distension and no mass. There is no rebound and no guarding.  Musculoskeletal:        General: No tenderness or edema. Normal range of motion.     Cervical back: Normal range of motion and neck supple.  Neurological: She is alert and oriented to person, place, and time.  Skin: Skin is warm.  Psychiatric: Affect normal.   LABORATORY DATA:  I have reviewed the data as listed Lab Results  Component Value Date   WBC 9.5 11/05/2019   HGB 12.7 11/05/2019   HCT 40.1 11/05/2019   MCV 103.4 (H) 11/05/2019   PLT 238 11/05/2019   Recent Labs    09/25/19 1338 09/26/19 0556 10/08/19 0806 10/08/19 0806 10/14/19 1029 10/18/19 1434 11/05/19 0848  NA  --    < > 137   < > 134* 139 135  K  --    < > 4.3   < > 3.5 4.0 4.2  CL  --    < > 104   < > 98 102 99  CO2  --    < > 24   < > 24 26 26   GLUCOSE  --    < > 98   < > 104* 80 135*  BUN  --    < > 13   < >  12 9 14   CREATININE  --    < > 0.74   < > 0.78 0.74 0.77  CALCIUM  --    < > 9.2   < > 9.8 9.6 9.5  GFRNONAA  --    < > >60   < > >60 >60 >60  GFRAA  --    < > >60   < > >60 >60 >60  PROT 7.4   < > 7.5  --  8.6*  --  8.3*  ALBUMIN 3.5   < > 3.8  --  4.2  --  4.3  AST 83*   < > 42*  --  56*  --  40  ALT 134*   < > 48*  --  71*  --  51*  ALKPHOS 180*   < > 178*  --  225*  --  230*  BILITOT 0.6   < > 0.4  --  0.8  --  0.5  BILIDIR 0.2  --   --   --   --   --   --   IBILI 0.4  --   --   --   --   --   --    < > = values in this interval not displayed.    RADIOGRAPHIC STUDIES: I have personally reviewed the radiological images as listed and agreed with the findings in the report. CT Chest W Contrast  Result Date: 10/06/2019 CLINICAL DATA:  History of  cervical neoplasm. EXAM: CT CHEST WITH CONTRAST TECHNIQUE: Multidetector CT imaging of the chest was performed during intravenous contrast administration. CONTRAST:  52m OMNIPAQUE IOHEXOL 300 MG/ML  SOLN COMPARISON:  Abdomen pelvis CT of 09/22/2019, CT chest 08/01/2019 FINDINGS: Cardiovascular: Scattered atherosclerosis in the thoracic aorta. Aorta is nondilated. Heart size is normal without pericardial effusion. Central pulmonary vasculature is normal. Right-sided Port-A-Cath remains in situ, terminating at the caval to atrial junction. Mediastinum/Nodes: No enlarged mediastinal, hilar, or axillary lymph nodes. Thyroid gland, trachea, and esophagus demonstrate no significant findings. Lungs/Pleura: Marked interval improvement of pulmonary lesions still with numerous lesions but with marked decrease in size. (Image 65, series 3) 5 mm predominantly cystic lesion with only thin wall remaining previously measured approximately 11 mm with a 4 mm thick wall surrounding an area of cavitation (Image 102, series 3) 5 mm nodule previously approximately 14 mm. Mainly a small cavitary focus on today's study, following other lesions seen on today's scan as compared to the prior. (Image 121, series 3) 5 mm lesion previously measuring approximately 11 mm. No signs of new lesion. Lesions are either diminished in size or in visible on today's study. There is some ground-glass opacity in the upper lobes. No dense consolidation. No significant interstitial thickening. Airways are patent. Upper Abdomen: Post cholecystectomy. Upper abdominal contents are incompletely imaged. Question of colitis seen on the prior study, not well evaluated on the current scan. No signs of ascites. No free air. Musculoskeletal: No acute bone finding or destructive bone process. IMPRESSION: 1. Marked interval improvement of pulmonary metastatic disease. 2. Question of colitis seen on the prior study, not well evaluated on the current scan. 3. Aortic  atherosclerosis. 4. Right-sided Port-A-Cath remains in situ, terminating at the caval to atrial junction. Aortic Atherosclerosis (ICD10-I70.0). Electronically Signed   By: GZetta BillsM.D.   On: 10/06/2019 16:07   DG Chest Portable 1 View  Result Date: 10/18/2019 CLINICAL DATA:  Weakness and shortness of breath EXAM: PORTABLE CHEST 1 VIEW COMPARISON:  10/06/2019 FINDINGS: Cardiac shadows within normal limits. Right-sided chest wall port is again seen. Previously noted nodular changes in the lungs are less well appreciated on the current exam. No focal infiltrate or sizable effusion is seen. IMPRESSION: Previously seen pulmonary metastatic disease is less well visualized on the current study. No acute abnormality noted. Electronically Signed   By: Inez Catalina M.D.   On: 10/18/2019 14:40    ASSESSMENT & PLAN:   Malignant neoplasm of overlapping sites of cervix ([place]) #Cervical adenocarcinoma with metastasis to the lung;    Currently on Alimta-status post 3 cycles. CT chest- dec 21nd- Improved lung nodues.  Clinically stable.  #Hold chemotherapy today-as patient is keen to proceed with left shoulder surgery/as this is significantly interfering with her quality of life.  We can reinitiate Alimta chemotherapy post surgery.  #Bilateral shoulder arthritis-left more than right.-Awaiting shoulder replacement.  Left a message for Dr. [name] office discuss the surgical planning-especially with history of adrenal sufficiency/bridging anticoagulation.  # Adrenal insufficiency-on hydrocortisone 20 mg/day; hydrocortisone in PM.  Clinically stable.  Patient might need a stress dose of steroids during surgery.  #Peripheral neuropathy- stable;  Continue gabapentin.  # Hypomagnesemia-secondary platinum chemotherapy-stable. Marland Kitchen  #Chronic right lower quadrant abdominal pain/scar tissue versus others- currently on oxycodone.  Stable.  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  See above-if surgery planned.  Patient  had to be bridged with Lovenox.  # DISPOSITION:  # NO Mag today/ No chemo today; de-access # follow up TBD- Dr.B    All questions were answered. The patient knows to call the clinic with any problems, questions or concerns.    Joanna Sickle, MD 11/05/2019 12:26 PM

## 2019-11-05 NOTE — Assessment & Plan Note (Addendum)
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 CT chest- dec 21nd- Improved lung nodues.  Clinically stable.  #Hold chemotherapy today-as patient is keen to proceed with left shoulder surgery/as this is significantly interfering with her quality of life.  We can reinitiate Alimta chemotherapy post surgery.  #Bilateral shoulder arthritis-left more than right.-Awaiting shoulder replacement.  Left a message for Dr. [name] office discuss the surgical planning-especially with history of adrenal sufficiency/bridging anticoagulation.  # Adrenal insufficiency-on hydrocortisone 20 mg/day; hydrocortisone in PM.  Clinically stable.  Patient might need a stress dose of steroids during surgery.  #Peripheral neuropathy- stable;  Continue gabapentin.  # Hypomagnesemia-secondary platinum chemotherapy-stable. Marland Kitchen  #Chronic right lower quadrant abdominal pain/scar tissue versus others- currently on oxycodone.  Stable.  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  See above-if surgery planned.  Patient had to be bridged with Lovenox.  # DISPOSITION:  # NO Mag today/ No chemo today; de-access # follow up TBD- Dr.B

## 2019-11-06 ENCOUNTER — Encounter: Payer: Self-pay | Admitting: Internal Medicine

## 2019-11-10 ENCOUNTER — Telehealth: Payer: Self-pay | Admitting: Gastroenterology

## 2019-11-10 ENCOUNTER — Telehealth: Payer: Self-pay | Admitting: Internal Medicine

## 2019-11-10 ENCOUNTER — Encounter: Payer: Self-pay | Admitting: Internal Medicine

## 2019-11-10 ENCOUNTER — Telehealth: Payer: Self-pay | Admitting: *Deleted

## 2019-11-10 ENCOUNTER — Other Ambulatory Visit: Payer: Self-pay | Admitting: Gastroenterology

## 2019-11-10 ENCOUNTER — Encounter: Payer: Self-pay | Admitting: Gastroenterology

## 2019-11-10 MED ORDER — AMITRIPTYLINE HCL 100 MG PO TABS: 100.0000 mg | ORAL_TABLET | Freq: Every day | ORAL | 1 refills | Status: DC

## 2019-11-10 NOTE — Telephone Encounter (Signed)
Pt left vm she states her pharmacy has requested a  Couple of prescriptions for her and she is checking on the status of that

## 2019-11-10 NOTE — Telephone Encounter (Signed)
Pt contacted cancer center for refill of amitriptyline. Pt advised to contact Dr. Marius Ditch, who last prescribed this medication.

## 2019-11-10 NOTE — Telephone Encounter (Signed)
On 1/22-spoke to patient regarding my discussion with Dr. [name] orthopedics.  Pros and cons of left shoulder surgery was discussed.  Patient understand that she will need to be off chemotherapy for about 6 weeks/for recovery.  Patient also understands significant risk of complications post surgery-that may prohibit further chemotherapy.  However given her ongoing difficulty with left shoulder pain-lack of improvement on conservative measures patient interested in surgery.  I would defer to Dr. [name] for further recommendations/plan of care.  Previously discussed with Dr. [name].

## 2019-11-11 ENCOUNTER — Encounter: Payer: Self-pay | Admitting: Internal Medicine

## 2019-11-12 ENCOUNTER — Other Ambulatory Visit: Payer: Self-pay | Admitting: *Deleted

## 2019-11-12 ENCOUNTER — Telehealth: Payer: Self-pay | Admitting: Internal Medicine

## 2019-11-12 ENCOUNTER — Encounter: Payer: Self-pay | Admitting: Internal Medicine

## 2019-11-12 ENCOUNTER — Other Ambulatory Visit: Payer: Self-pay

## 2019-11-12 DIAGNOSIS — C538 Malignant neoplasm of overlapping sites of cervix uteri: Secondary | ICD-10-CM

## 2019-11-12 NOTE — Telephone Encounter (Signed)
On 1/26- spoke to pt re: her plan for upcoming shoulder surgery- might take up to 2 weeks. I think its resoanable to proceed with chemo this week; and plan surgery in 3 weeks. Pt will contact her surgeon re: timing of surgery. InformedNira Conn, RN.

## 2019-11-13 ENCOUNTER — Inpatient Hospital Stay: Payer: Medicaid Other

## 2019-11-13 ENCOUNTER — Other Ambulatory Visit: Payer: Self-pay

## 2019-11-13 ENCOUNTER — Inpatient Hospital Stay (HOSPITAL_BASED_OUTPATIENT_CLINIC_OR_DEPARTMENT_OTHER): Payer: Medicaid Other | Admitting: Internal Medicine

## 2019-11-13 DIAGNOSIS — Z7189 Other specified counseling: Secondary | ICD-10-CM

## 2019-11-13 DIAGNOSIS — C538 Malignant neoplasm of overlapping sites of cervix uteri: Secondary | ICD-10-CM

## 2019-11-13 DIAGNOSIS — M25549 Pain in joints of unspecified hand: Secondary | ICD-10-CM

## 2019-11-13 DIAGNOSIS — E440 Moderate protein-calorie malnutrition: Secondary | ICD-10-CM | POA: Diagnosis not present

## 2019-11-13 DIAGNOSIS — I700 Atherosclerosis of aorta: Secondary | ICD-10-CM

## 2019-11-13 DIAGNOSIS — Z5111 Encounter for antineoplastic chemotherapy: Secondary | ICD-10-CM | POA: Diagnosis not present

## 2019-11-13 DIAGNOSIS — D696 Thrombocytopenia, unspecified: Secondary | ICD-10-CM

## 2019-11-13 DIAGNOSIS — T451X5A Adverse effect of antineoplastic and immunosuppressive drugs, initial encounter: Secondary | ICD-10-CM

## 2019-11-13 DIAGNOSIS — C799 Secondary malignant neoplasm of unspecified site: Secondary | ICD-10-CM | POA: Diagnosis not present

## 2019-11-13 DIAGNOSIS — F331 Major depressive disorder, recurrent, moderate: Secondary | ICD-10-CM | POA: Insufficient documentation

## 2019-11-13 DIAGNOSIS — D701 Agranulocytosis secondary to cancer chemotherapy: Secondary | ICD-10-CM

## 2019-11-13 DIAGNOSIS — E2740 Unspecified adrenocortical insufficiency: Secondary | ICD-10-CM

## 2019-11-13 LAB — CBC WITH DIFFERENTIAL/PLATELET
Abs Immature Granulocytes: 0.04 10*3/uL (ref 0.00–0.07)
Basophils Absolute: 0 10*3/uL (ref 0.0–0.1)
Basophils Relative: 0 %
Eosinophils Absolute: 0 10*3/uL (ref 0.0–0.5)
Eosinophils Relative: 0 %
HCT: 36.2 % (ref 36.0–46.0)
Hemoglobin: 11.5 g/dL — ABNORMAL LOW (ref 12.0–15.0)
Immature Granulocytes: 1 %
Lymphocytes Relative: 5 %
Lymphs Abs: 0.4 10*3/uL — ABNORMAL LOW (ref 0.7–4.0)
MCH: 33.3 pg (ref 26.0–34.0)
MCHC: 31.8 g/dL (ref 30.0–36.0)
MCV: 104.9 fL — ABNORMAL HIGH (ref 80.0–100.0)
Monocytes Absolute: 0.4 10*3/uL (ref 0.1–1.0)
Monocytes Relative: 4 %
Neutro Abs: 7.8 10*3/uL — ABNORMAL HIGH (ref 1.7–7.7)
Neutrophils Relative %: 90 %
Platelets: 159 10*3/uL (ref 150–400)
RBC: 3.45 MIL/uL — ABNORMAL LOW (ref 3.87–5.11)
RDW: 13.2 % (ref 11.5–15.5)
WBC: 8.6 10*3/uL (ref 4.0–10.5)
nRBC: 0 % (ref 0.0–0.2)

## 2019-11-13 LAB — COMPREHENSIVE METABOLIC PANEL
ALT: 75 U/L — ABNORMAL HIGH (ref 0–44)
AST: 40 U/L (ref 15–41)
Albumin: 3.9 g/dL (ref 3.5–5.0)
Alkaline Phosphatase: 197 U/L — ABNORMAL HIGH (ref 38–126)
Anion gap: 8 (ref 5–15)
BUN: 12 mg/dL (ref 6–20)
CO2: 23 mmol/L (ref 22–32)
Calcium: 8.9 mg/dL (ref 8.9–10.3)
Chloride: 103 mmol/L (ref 98–111)
Creatinine, Ser: 0.79 mg/dL (ref 0.44–1.00)
GFR calc Af Amer: >60 mL/min (ref >60)
GFR calc non Af Amer: >60 mL/min (ref >60)
Glucose, Bld: 133 mg/dL — ABNORMAL HIGH (ref 70–99)
Potassium: 4.3 mmol/L (ref 3.5–5.1)
Sodium: 134 mmol/L — ABNORMAL LOW (ref 135–145)
Total Bilirubin: 0.6 mg/dL (ref 0.3–1.2)
Total Protein: 7.4 g/dL (ref 6.5–8.1)

## 2019-11-13 LAB — MAGNESIUM: Magnesium: 1.4 mg/dL — ABNORMAL LOW (ref 1.7–2.4)

## 2019-11-13 MED ORDER — PROCHLORPERAZINE MALEATE 10 MG PO TABS
10.0000 mg | ORAL_TABLET | Freq: Once | ORAL | Status: AC
  Administered 2019-11-13: 10 mg via ORAL
  Filled 2019-11-13: qty 1

## 2019-11-13 MED ORDER — SODIUM CHLORIDE 0.9 % IV SOLN
Freq: Once | INTRAVENOUS | Status: AC
  Filled 2019-11-13: qty 250

## 2019-11-13 MED ORDER — OXYCODONE-ACETAMINOPHEN 5-325 MG PO TABS
1.0000 | ORAL_TABLET | Freq: Once | ORAL | Status: AC
  Administered 2019-11-13: 1 via ORAL
  Filled 2019-11-13: qty 1

## 2019-11-13 MED ORDER — HEPARIN SOD (PORK) LOCK FLUSH 100 UNIT/ML IV SOLN
500.0000 [IU] | Freq: Once | INTRAVENOUS | Status: AC
  Administered 2019-11-13: 500 [IU] via INTRAVENOUS
  Filled 2019-11-13: qty 5

## 2019-11-13 MED ORDER — SODIUM CHLORIDE 0.9 % IV SOLN
500.0000 mg/m2 | Freq: Once | INTRAVENOUS | Status: AC
  Administered 2019-11-13: 1000 mg via INTRAVENOUS
  Filled 2019-11-13: qty 40

## 2019-11-13 MED ORDER — SODIUM CHLORIDE 0.9% FLUSH
10.0000 mL | Freq: Once | INTRAVENOUS | Status: AC
  Administered 2019-11-13: 10 mL via INTRAVENOUS
  Filled 2019-11-13: qty 10

## 2019-11-13 NOTE — Patient Instructions (Signed)
#   Ice pack on right hand/ wrist- if not improved to let us know  # lets [place] know about the timing of surgery- need to re-evaluate anti-coagulation; also- need to speak to Endocrinology re: stress dose of steroids during surgery.

## 2019-11-13 NOTE — Assessment & Plan Note (Addendum)
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 CT chest- dec 21nd- Improved lung nodues.  STABLE.   # proceed with Alimta chemotherapy; Labs today reviewed;  acceptable for treatment today.   #Bilateral shoulder arthritis-left more than right.-Awaiting shoulder replacement. Discussed with Dr. [name] office discuss the surgical planning-especially with history of adrenal sufficiency/bridging anticoagulation.  Again reminded the patient to let us know the timing of the surgery to coordinate her care moving forward.  # Adrenal insufficiency-on hydrocortisone 20 mg/day; hydrocortisone in PM.  Stable; discussed need to contacting endocrinology re: stress dose of steroids at surgery.   #Peripheral neuropathy- STABLE; Continue gabapentin.  # Hypomagnesemia-secondary platinum chemotherapy- STABLE.   #Chronic right lower quadrant abdominal pain/scar tissue versus others- currently on oxycodone.   Stable.  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  Patient had to be bridged with Lovenox.  # DISPOSITION:  # alimta today # follow up in 3 weeks- MD; labs; MD- alimta; mag IV 2 hour. Dr.B

## 2019-11-13 NOTE — Progress Notes (Signed)
[facility] NOTE  Patient Care Team: [place] as PCP - General (Family Medicine) Mellody Drown, MD as Consulting Physician (Obstetrics and Gynecology) [name], MD as Consulting Physician (Gastroenterology) Michael Boston, MD as Consulting Physician (General Surgery) Lovell Sheehan, MD as Consulting Physician (Orthopedic Surgery) Cammie Sickle, MD as Consulting Physician (Oncology) Earnestine Leys, MD as Consulting Physician (Orthopedic Surgery)  CHIEF COMPLAINTS/PURPOSE OF CONSULTATION: Cervical cancer  #  Oncology History Overview Note  # dec 2017- CERVICAL ADENO CA [[place]]; Stage IB [Dr. Christene Slates at [facility] in [place], [place];No surgery- Chemo-RT;   # 2018-sep - lung nodules [in [place]]  # AUG 20th, 2019-#1 Carbo-Taxol s/p 6 cycles- [No avastin sec to blood clots]: Feb 2020-bilateral lung nodules with 1 cm in size. March 2020- finished carbo-taxol #7; poor tolerance to [place].  # April 15 th 2020- Start Taxol weekly x 3; one week OFF;  # July 2020-CT- bil cavitary lesions- slightly bigger by few mm/overall STABLE; CT a/p-NED. 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  She received treatment from 11/2016-05/2017.  01/2017 cisplatin x 2 and carboplatin x 1 (01/29/2017) due to ARF and XRT.  XRT was followed by T & O on 02/01/2017 and T & N 02/10/2017 and 02/20/2017.  Course  was complicated by 80 pound weight loss, nausea, vomiting, electrolyte wasting (potassium and magnesium).  She describes that.  Is been sick constantly requiring at least 20 hospitalizations.  Follow-up CT chest and PET on 06/2017. Per patient, 'radiation worked' and no disease in the abdomen.  At that time she was noted to have lung nodules that were growing and follow-up imaging was scheduled for 10/2017.  She was admitted to hospital in [place] for small bowel obstruction which was managed conservatively and she was home for a week prior to traveling to [place] for Thanksgiving holiday where she has family.  She presented to ER in [place] on 08/2017 with nausea, vomiting, and lower abdominal pain.  Symptoms did not respond to conservative treatment.   CT on 08/26/2017 revealed small bowel obstruction with transition in the pelvis just superior to the uterus rather was a long segment of distal ileum with fatty wall thickening compatible with chronic inflammation and/or radiation enteritis. Imaging showed numerous pulmonary nodules consistent with metastatic disease. She underwent laparotomy and right ileocolectomy on 08/31/2017 at [facility].  Surgical findings revealed a thickened, matted, and scarred piece of distal small bowel close to the ileocecal valve.  She was discharged on 09/05/2017.  Pain markedly increased in intensity and imaging was performed on 09/11/2017 which revealed: Debris within the anterior abdominal wall incision concerning for infection versus packing material, s/p post ileo-colectomy with expected postoperative changes, mild colonic ileus, numerous pulmonary nodules highly concerning for metastatic disease, punctate nonobstructing nephrolithiasis.  Staples were removed and one was packed.  She was started on doxycycline.  Abdominal and pelvic CT without contrast on 09/11/2017 revealed debris within anterior abdominal wall incision concerning  for infection, versus  packing material.   She is s/p ileocolectomy with expected postoperative changes and mild colonic ileus.  There were numerous pulmonary nodules highly concerning for metastatic disease and punctate nonobstructing nephrolithiasis. She was readmitted on 09/12/2017.  She describes the onset of lower abdominal pain on 09/09/2017.  Pain markedly increased in intensity on 09/11/2017.   Staples were removed and the wound packed.  She was started on doxycycline.  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   PET scan was ordered to evaluate enlarging lung nodules with concern for recurrent cervical cancer but scan was delayed due to insurance and need to be performed in [place].  Presented to ER on 12/03/2017 for abdominal pain and emesis.  Imaging concerning for worsening possible uterine infarct and she was admitted to hospital.  Pelvic MRI was unremarkable.  Remote scarring type changes of uterus thought to be possibly related to radiation.  She was discharged on 12/08/2017.  Underwent endoscopy and colonoscopy on 12/20/2017.    On 02/22/2018 she underwent laser ablation of condylomata around the anus and vagina under anesthesia with Dr. Johney Maine.   04/15/2018:  Chest, abdomen, and pelvis CT revealed innumerable (> 100) cavitary nodules scattered in the lungs, moderately enlarging compared to the 11/08/2017 PET-CT, suspicious for metastatic disease.  One index node in the RLL measures 1.0 x 1.1 cm (previously 0.6 x 0.6 cm).  There were no new nodules.  There was an ill-defined wall thickening in the rectosigmoid with surrounding  stranding along fascia planes, probably sequela from prior radiation therapy.  There was multilevel lumbar impingement due to spondylosis and degenerative disc disease.  There was heterogeneous enhancement in the uterus (some possibly from prior radiation therapy).   04/23/2018:  PET scan revealed numerous scattered solid and cavitary nodules in the lungs stable increased in size compared to the prior PET-CT from 11/08/2017. Largest nodule was 1.1 cm in the LUL (SUV 1.9).  These demonstrated low-grade metabolic activity up to a maximum SUV of about 2.3, increased from 11/08/2017.    Case was discussed at tumor board on 04/25/2018. Consensus to pursue CT-guided biopsy (05/06/18) which revealed: Metastatic adenocarcinoma, morphologically consistent with cervical adenocarcinoma.  She has history of chronic hepatitis C which is managed by GI.  Hepatitis C genotype is 2a/2c.  She receives B12 injections for history of B12 deficiency.  On 04/24/2018 she underwent left total knee replacement with Dr. [name]. --------------------------------------------------------   DIAGNOSIS: Cervical adenocarcinoma  STAGE: IV        ;GOALS: Palliative  CURRENT/MOST RECENT THERAPY : Alimta [C]    Malignant neoplasm of overlapping sites of cervix ([place])  01/29/2019 - 04/23/2019 Chemotherapy   The patient had PACLitaxel (TAXOL) 156 mg in sodium chloride 0.9 % 250 mL chemo infusion (</= 61m/m2), 80 mg/m2 = 156 mg, Intravenous,  Once, 3 of 4 cycles Dose modification: 65 mg/m2 (original dose 80 mg/m2, Cycle 1, Reason: Provider Judgment) Administration: 156 mg (01/29/2019), 156 mg (02/05/2019), 126 mg (02/12/2019), 126 mg (02/26/2019), 126 mg (03/13/2019), 126 mg (03/27/2019), 126 mg (03/20/2019), 126 mg (04/03/2019), 126 mg (04/10/2019)  for chemotherapy treatment.    05/08/2019 - 07/30/2019 Chemotherapy  The patient had pembrolizumab (KEYTRUDA) 200 mg in sodium chloride 0.9 % 50 mL chemo infusion, 200 mg, Intravenous, Once, 4 of 6  cycles Administration: 200 mg (05/08/2019), 200 mg (05/29/2019), 200 mg (06/19/2019), 200 mg (07/10/2019)  for chemotherapy treatment.    08/07/2019 -  Chemotherapy   The patient had pegfilgrastim-cbqv [facility]) injection 6 mg, 6 mg, Subcutaneous, Once, 2 of 5 cycles Administration: 6 mg (08/29/2019), 6 mg (09/19/2019) PEMEtrexed (ALIMTA) 1,000 mg in sodium chloride 0.9 % 100 mL chemo infusion, 950 mg, Intravenous,  Once, 3 of 6 cycles Administration: 1,000 mg (08/07/2019), 1,000 mg (09/18/2019), 1,000 mg (08/28/2019)  for chemotherapy treatment.       HISTORY OF PRESENTING ILLNESS:  CAMILE ESTERS 54 y.o.  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  Patient's chemotherapy was recently had approximately 49-monthbecause for plan for upcoming left shoulder surgery.  She is currently awaiting MRI of the left shoulder/neck surgery not scheduled yet.  She is here to proceed with chemotherapy.  Patient complains of pain and swelling of the right wrist with movement.  Not sure of any trauma.  No fever chills.  Chronic abdominal pain needing narcotic pain medication.  No diarrhea.  Continues to have multiple joint pains including left shoulder pain.  Review of Systems  Constitutional: Positive for malaise/fatigue. Negative for chills, diaphoresis, fever and weight loss.  HENT: Negative for nosebleeds and sore throat.   Eyes: Negative for double vision.  Respiratory: Negative for cough, hemoptysis, sputum production, shortness of breath and wheezing.   Cardiovascular: Negative for chest pain, palpitations, orthopnea and leg swelling.  Gastrointestinal: Positive for abdominal pain and constipation. Negative for blood in stool, heartburn, melena, nausea and vomiting.  Genitourinary: Negative for dysuria, frequency and urgency.  Musculoskeletal: Positive for back pain and joint pain.   Skin: Negative.  Negative for itching and rash.  Neurological: Positive for tingling. Negative for dizziness, focal weakness, weakness and headaches.  Endo/Heme/Allergies: Does not bruise/bleed easily.  Psychiatric/Behavioral: Negative for depression. The patient is not nervous/anxious and does not have insomnia.      MEDICAL HISTORY:  Past Medical History:  Diagnosis Date  . Abdominal pain 06/10/2018  . Abnormal cervical Papanicolaou smear 09/18/2017  . Anxiety   . Aortic atherosclerosis ([place]   . Arthritis    neck and knees  . 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 Blood transfusion without reported diagnosis   . Cervical cancer ([place] 09/2016   mets lung  . Chronic anal fissure   . Chronic diarrhea   . Dyspnea   . Erosive gastropathy 09/18/2017  . Factor V Leiden mutation ([place]   . Fecal incontinence   . Genital warts   . GERD (gastroesophageal reflux disease)   . GI bleed 10/08/2018  . Heart murmur   . Hematochezia   . Hemorrhoids   . 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 Hepatitis, chronic ([place] 05/05/2017  . History of cancer chemotherapy    completed 06/2017  . History of Clostridium difficile infection    while undergoing chemo.  Negative test 10/2017  . Ileocolic anastomotic leak   . 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 Intestinal infection due to Clostridium difficile 09/18/2017  . Macrocytic anemia with vitamin B12 deficiency   .  Multiple gastric ulcers   . Nausea vomiting and diarrhea   . Pancolitis ([place]) 07/27/2018  . Perianal condylomata   . Pneumonia    History of  . Pulmonary nodules   . Rectal bleeding   . Small bowel obstruction ([place]) 08/2017  . Stiff neck    limited right turn  . Vitamin D deficiency     SURGICAL HISTORY: Past Surgical History:  Procedure Laterality Date  . CHOLECYSTECTOMY    . COLON SURGERY  08/2017   resection  . COLONOSCOPY WITH PROPOFOL N/A 12/20/2017    Procedure: COLONOSCOPY WITH PROPOFOL;  Surgeon: [name], MD;  Location: [facility] ENDOSCOPY;  Service: Gastroenterology;  Laterality: N/A;  . COLONOSCOPY WITH PROPOFOL N/A 07/30/2018   Procedure: COLONOSCOPY WITH PROPOFOL;  Surgeon: [name], MD;  Location: [facility] ENDOSCOPY;  Service: Gastroenterology;  Laterality: N/A;  . COLONOSCOPY WITH PROPOFOL N/A 10/10/2018   Procedure: COLONOSCOPY WITH PROPOFOL;  Surgeon: Lucilla Lame, MD;  Location: [facility] ENDOSCOPY;  Service: Endoscopy;  Laterality: N/A;  . DIAGNOSTIC LAPAROSCOPY    . ESOPHAGOGASTRODUODENOSCOPY (EGD) WITH PROPOFOL N/A 12/20/2017   Procedure: ESOPHAGOGASTRODUODENOSCOPY (EGD) WITH PROPOFOL;  Surgeon: [name], MD;  Location: [place];  Service: Gastroenterology;  Laterality: N/A;  . ESOPHAGOGASTRODUODENOSCOPY (EGD) WITH PROPOFOL  07/30/2018   Procedure: ESOPHAGOGASTRODUODENOSCOPY (EGD) WITH PROPOFOL;  Surgeon: [name], MD;  Location: [facility] ENDOSCOPY;  Service: Gastroenterology;;  . Otho Darner SIGMOIDOSCOPY N/A 11/21/2018   Procedure: FLEXIBLE SIGMOIDOSCOPY;  Surgeon: [name], MD;  Location: [facility] ENDOSCOPY;  Service: Gastroenterology;  Laterality: N/A;  . LAPAROTOMY N/A 08/31/2017   Procedure: EXPLORATORY LAPAROTOMY for SBO, ileocolectomy, removal of piece of uterine wall;  Surgeon: [name], MD;  Location: [facility] ORS;  Service: General;  Laterality: N/A;  . LASER ABLATION CONDOLAMATA N/A 02/22/2018   Procedure: LASER ABLATION/REMOVAL OF TDDUKGURKYH AROUND ANUS AND VAGINA;  Surgeon: Michael Boston, MD;  Location: [place];  Service: General;  Laterality: N/A;  . OOPHORECTOMY    . PORTA CATH INSERTION N/A 05/13/2018   Procedure: PORTA CATH INSERTION;  Surgeon: Katha Cabal, MD;  Location: [place] CV LAB;  Service: Cardiovascular;  Laterality: N/A;  . SMALL INTESTINE SURGERY    . TANDEM RING INSERTION     x3  . THORACOTOMY    . TOTAL KNEE ARTHROPLASTY Left  04/24/2018   Procedure: TOTAL KNEE ARTHROPLASTY;  Surgeon: Lovell Sheehan, MD;  Location: [facility] ORS;  Service: Orthopedics;  Laterality: Left;    SOCIAL HISTORY: Social History   Socioeconomic History  . Marital status: Divorced    Spouse name: Not on file  . Number of children: Not on file  . Years of education: Not on file  . Highest education level: Not on file  Occupational History  . Not on file  Tobacco Use  . Smoking status: Former Smoker    Packs/day: 0.25    Years: 10.00    Pack years: 2.50    Types: Cigarettes    Quit date: 10/16/2006    Years since quitting: 13.0  . Smokeless tobacco: Never Used  Substance and Sexual Activity  . Alcohol use: Not Currently    Comment: seldom  . Drug use: Yes    Types: Marijuana    Comment: not very often  . Sexual activity: Not Currently    Birth control/protection: Post-menopausal    Comment: Not Asked  Other Topics Concern  . Not on file  Social History Narrative  . Not on file  Social Determinants of Health   Financial Resource Strain:   . Difficulty of Paying Living Expenses: Not on file  Food Insecurity:   . Worried About Charity fundraiser in the Last Year: Not on file  . Ran Out of Food in the Last Year: Not on file  Transportation Needs:   . Lack of Transportation (Medical): Not on file  . Lack of Transportation (Non-Medical): Not on file  Physical Activity:   . Days of Exercise per Week: Not on file  . Minutes of Exercise per Session: Not on file  Stress:   . Feeling of Stress : Not on file  Social Connections:   . Frequency of Communication with Friends and Family: Not on file  . Frequency of Social Gatherings with Friends and Family: Not on file  . Attends Religious Services: Not on file  . Active Member of Clubs or Organizations: Not on file  . Attends Archivist Meetings: Not on file  . Marital Status: Not on file  Intimate Partner Violence:   . Fear of Current or Ex-Partner: Not on file  .  Emotionally Abused: Not on file  . Physically Abused: Not on file  . Sexually Abused: Not on file    FAMILY HISTORY: Family History  Problem Relation Age of Onset  . Hypertension Mother   . Hypertension Father   . Diabetes Father   . Hyperlipidemia Father   . Alcohol abuse Daughter   . Hypertension Maternal Grandmother   . Diabetes Maternal Grandmother   . Diabetes Paternal Grandmother   . Hypertension Paternal Grandmother     ALLERGIES:  is allergic to ketamine.  MEDICATIONS:  Current Outpatient Medications  Medication Sig Dispense Refill  . amitriptyline (ELAVIL) 100 MG tablet Take 1 tablet (100 mg total) by mouth at bedtime. 90 tablet 1  . Calcium Carb-Cholecalciferol (CALCIUM 500 +D) 500-400 MG-UNIT TABS Take 2 tablets by mouth daily.    . cholestyramine (QUESTRAN) 4 g packet Take 1 packet (4 g total) by mouth 2 (two) times daily. 60 each 12  . diphenoxylate-atropine (LOMOTIL) 2.5-0.025 MG tablet Take 2 tablets by mouth 2 (two) times daily as needed for diarrhea or loose stools.    . famotidine (PEPCID) 20 MG tablet Take 1 tablet (20 mg total) by mouth 2 (two) times daily. 60 tablet 0  . folic acid (FOLVITE) 1 MG tablet Take 1 tablet (1 mg total) by mouth daily. 30 tablet 6  . hydrocortisone (CORTEF) 10 MG tablet Take 1 tablet (10 mg total) by mouth 2 (two) times daily. 180 tablet 0  . hydrOXYzine (ATARAX/VISTARIL) 10 MG tablet Take 1 tablet (10 mg total) by mouth 3 (three) times daily as needed. 60 tablet 0  . hyoscyamine (LEVBID) 0.375 MG 12 hr tablet Take 1 tablet (0.375 mg total) by mouth 2 (two) times daily as needed for cramping.    . ondansetron (ZOFRAN ODT) 4 MG disintegrating tablet Take 1 tablet (4 mg total) by mouth every 8 (eight) hours as needed for nausea or vomiting. 20 tablet 0  . oxyCODONE (OXYCONTIN) 20 mg 12 hr tablet Take 1 tablet (20 mg total) by mouth every 8 (eight) hours. 90 tablet 0  . Oxycodone HCl 10 MG TABS Take 1-2 tablets (10-20 mg total) by mouth  every 6 (six) hours as needed (for breakthrough pain). 120 tablet 0  . pantoprazole (PROTONIX) 40 MG tablet Take 1 tablet (40 mg total) by mouth daily. 90 tablet 1  . potassium chloride  SA (KLOR-CON) 20 MEQ tablet Take 1 tablet (20 mEq total) by mouth 2 (two) times daily. 30 tablet 0  . promethazine (PHENERGAN) 25 MG tablet Take 1 tablet (25 mg total) by mouth every 8 (eight) hours as needed for nausea or vomiting. 30 tablet 6  . rivaroxaban (XARELTO) 20 MG TABS tablet Take 1 tablet (20 mg total) by mouth every morning. Takes in the morning    . sertraline (ZOLOFT) 25 MG tablet Take 1 tablet (25 mg total) by mouth daily. 30 tablet 0  . traMADol (ULTRAM) 50 MG tablet Take 50 mg by mouth every 6 (six) hours as needed.    . VENTOLIN HFA 108 (90 Base) MCG/ACT inhaler Inhale 1-2 puffs into the lungs every 4 (four) hours as needed for shortness of breath. 1 Inhaler 1   No current facility-administered medications for this visit.   Facility-Administered Medications Ordered in Other Visits  Medication Dose Route Frequency Provider Last Rate Last Admin  . heparin lock flush 100 unit/mL  500 Units Intravenous Once Corcoran, Melissa C, MD      . heparin lock flush 100 unit/mL  500 Units Intravenous Once Jeanean Hollett R, MD      . sodium chloride 0.9 % 1,000 mL with potassium chloride 20 mEq infusion   Intravenous Once Honor Loh E, NP      . sodium chloride flush (NS) 0.9 % injection 10 mL  10 mL Intravenous Once Borders, Kirt Boys, NP          .  PHYSICAL EXAMINATION: ECOG PERFORMANCE STATUS: 1 - Symptomatic but completely ambulatory  Vitals:   11/12/19 1500  BP: 136/88  Pulse: 74  Temp: (!) 95.6 F (35.3 C)   Filed Weights   11/12/19 1500  Weight: 176 lb (79.8 kg)    Physical Exam  Constitutional: She is oriented to person, place, and time and well-developed, well-nourished, and in no distress.  Alone.  Walking herself.   HENT:  Head: Normocephalic and atraumatic.   Mouth/Throat: Oropharynx is clear and moist. No oropharyngeal exudate.  Eyes: Pupils are equal, round, and reactive to light.  Cardiovascular: Normal rate and regular rhythm.  Pulmonary/Chest: Effort normal and breath sounds normal. No respiratory distress. She has no wheezes.  Abdominal: Soft. Bowel sounds are normal. She exhibits no distension and no mass. There is no rebound and no guarding.  Musculoskeletal:        General: No tenderness or edema. Normal range of motion.     Cervical back: Normal range of motion and neck supple.  Neurological: She is alert and oriented to person, place, and time.  Skin: Skin is warm.  Psychiatric: Affect normal.   LABORATORY DATA:  I have reviewed the data as listed Lab Results  Component Value Date   WBC 8.6 11/13/2019   HGB 11.5 (L) 11/13/2019   HCT 36.2 11/13/2019   MCV 104.9 (H) 11/13/2019   PLT 159 11/13/2019   Recent Labs    09/25/19 1338 09/26/19 0556 10/14/19 1029 10/14/19 1029 10/18/19 1434 11/05/19 0848 11/13/19 0852  NA  --    < > 134*   < > 139 135 134*  K  --    < > 3.5   < > 4.0 4.2 4.3  CL  --    < > 98   < > 102 99 103  CO2  --    < > 24   < > 26 26 23   GLUCOSE  --    < >  104*   < > 80 135* 133*  BUN  --    < > 12   < > 9 14 12   CREATININE  --    < > 0.78   < > 0.74 0.77 0.79  CALCIUM  --    < > 9.8   < > 9.6 9.5 8.9  GFRNONAA  --    < > >60   < > >60 >60 >60  GFRAA  --    < > >60   < > >60 >60 >60  PROT 7.4   < > 8.6*  --   --  8.3* 7.4  ALBUMIN 3.5   < > 4.2  --   --  4.3 3.9  AST 83*   < > 56*  --   --  40 40  ALT 134*   < > 71*  --   --  51* 75*  ALKPHOS 180*   < > 225*  --   --  230* 197*  BILITOT 0.6   < > 0.8  --   --  0.5 0.6  BILIDIR 0.2  --   --   --   --   --   --   IBILI 0.4  --   --   --   --   --   --    < > = values in this interval not displayed.    RADIOGRAPHIC STUDIES: I have personally reviewed the radiological images as listed and agreed with the findings in the report. DG Chest Portable  1 View  Result Date: 10/18/2019 CLINICAL DATA:  Weakness and shortness of breath EXAM: PORTABLE CHEST 1 VIEW COMPARISON:  10/06/2019 FINDINGS: Cardiac shadows within normal limits. Right-sided chest wall port is again seen. Previously noted nodular changes in the lungs are less well appreciated on the current exam. No focal infiltrate or sizable effusion is seen. IMPRESSION: Previously seen pulmonary metastatic disease is less well visualized on the current study. No acute abnormality noted. Electronically Signed   By: Inez Catalina M.D.   On: 10/18/2019 14:40    ASSESSMENT & PLAN:   Malignant neoplasm of overlapping sites of cervix ([place]) #Cervical adenocarcinoma with metastasis to the lung;    Currently on Alimta-status post 3 cycles. CT chest- dec 21nd- Improved lung nodues.  STABLE.   # proceed with Alimta chemotherapy; Labs today reviewed;  acceptable for treatment today.   #Bilateral shoulder arthritis-left more than right.-Awaiting shoulder replacement. Discussed with Dr. [name] office discuss the surgical planning-especially with history of adrenal sufficiency/bridging anticoagulation...  # Adrenal insufficiency-on hydrocortisone 20 mg/day; hydrocortisone in PM.  Stable; discussed need to contacting endocrinology re: stress dose of steroids at surgery.   #Peripheral neuropathy- STABLE; Continue gabapentin.  # Hypomagnesemia-secondary platinum chemotherapy- STABLE.   #Chronic right lower quadrant abdominal pain/scar tissue versus others- currently on oxycodone.   Stable.  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  Patient had to be bridged with Lovenox.  # DISPOSITION:  # alimta today # follow up in 3 weeks- MD; labs; MD- alimta; mag IV 2 hour. Dr.B    All questions were answered. The patient knows to call the clinic with any problems, questions or concerns.    Cammie Sickle, MD 11/13/2019 9:41 AM

## 2019-11-19 ENCOUNTER — Telehealth: Payer: Self-pay | Admitting: *Deleted

## 2019-11-19 ENCOUNTER — Inpatient Hospital Stay: Payer: Medicaid Other | Attending: Hospice and Palliative Medicine | Admitting: Hospice and Palliative Medicine

## 2019-11-19 DIAGNOSIS — Z7952 Long term (current) use of systemic steroids: Secondary | ICD-10-CM | POA: Insufficient documentation

## 2019-11-19 DIAGNOSIS — C539 Malignant neoplasm of cervix uteri, unspecified: Secondary | ICD-10-CM | POA: Insufficient documentation

## 2019-11-19 DIAGNOSIS — D6851 Activated protein C resistance: Secondary | ICD-10-CM | POA: Insufficient documentation

## 2019-11-19 DIAGNOSIS — Z87891 Personal history of nicotine dependence: Secondary | ICD-10-CM | POA: Insufficient documentation

## 2019-11-19 DIAGNOSIS — G629 Polyneuropathy, unspecified: Secondary | ICD-10-CM | POA: Insufficient documentation

## 2019-11-19 DIAGNOSIS — F419 Anxiety disorder, unspecified: Secondary | ICD-10-CM | POA: Insufficient documentation

## 2019-11-19 DIAGNOSIS — R112 Nausea with vomiting, unspecified: Secondary | ICD-10-CM

## 2019-11-19 DIAGNOSIS — Z8249 Family history of ischemic heart disease and other diseases of the circulatory system: Secondary | ICD-10-CM | POA: Insufficient documentation

## 2019-11-19 DIAGNOSIS — C7802 Secondary malignant neoplasm of left lung: Secondary | ICD-10-CM | POA: Insufficient documentation

## 2019-11-19 DIAGNOSIS — Z8616 Personal history of COVID-19: Secondary | ICD-10-CM | POA: Insufficient documentation

## 2019-11-19 DIAGNOSIS — R21 Rash and other nonspecific skin eruption: Secondary | ICD-10-CM | POA: Insufficient documentation

## 2019-11-19 DIAGNOSIS — Z79899 Other long term (current) drug therapy: Secondary | ICD-10-CM | POA: Insufficient documentation

## 2019-11-19 DIAGNOSIS — E2740 Unspecified adrenocortical insufficiency: Secondary | ICD-10-CM | POA: Insufficient documentation

## 2019-11-19 DIAGNOSIS — R197 Diarrhea, unspecified: Secondary | ICD-10-CM

## 2019-11-19 DIAGNOSIS — Z515 Encounter for palliative care: Secondary | ICD-10-CM

## 2019-11-19 DIAGNOSIS — Z9221 Personal history of antineoplastic chemotherapy: Secondary | ICD-10-CM | POA: Insufficient documentation

## 2019-11-19 DIAGNOSIS — Z7901 Long term (current) use of anticoagulants: Secondary | ICD-10-CM | POA: Insufficient documentation

## 2019-11-19 DIAGNOSIS — Z833 Family history of diabetes mellitus: Secondary | ICD-10-CM | POA: Insufficient documentation

## 2019-11-19 DIAGNOSIS — Z90721 Acquired absence of ovaries, unilateral: Secondary | ICD-10-CM | POA: Insufficient documentation

## 2019-11-19 DIAGNOSIS — C7801 Secondary malignant neoplasm of right lung: Secondary | ICD-10-CM | POA: Insufficient documentation

## 2019-11-19 DIAGNOSIS — Z86718 Personal history of other venous thrombosis and embolism: Secondary | ICD-10-CM | POA: Insufficient documentation

## 2019-11-19 DIAGNOSIS — E876 Hypokalemia: Secondary | ICD-10-CM | POA: Insufficient documentation

## 2019-11-19 DIAGNOSIS — Z86711 Personal history of pulmonary embolism: Secondary | ICD-10-CM | POA: Insufficient documentation

## 2019-11-19 NOTE — Progress Notes (Signed)
Did not reach patient by phone.  Voicemail left.  Will reschedule visit.

## 2019-11-19 NOTE — Telephone Encounter (Signed)
Patient accepts appointment for tmorrow at 8 AM for lab/ [facility]/ IVF

## 2019-11-19 NOTE — Telephone Encounter (Signed)
Patient called reporting that she has been having vomiting and diarrhea and would like an appointment for tomorrow for IV fluids and anything else she may need. Please advise

## 2019-11-19 NOTE — Telephone Encounter (Signed)
Sure. We can see her.  Joanna Hall

## 2019-11-20 ENCOUNTER — Other Ambulatory Visit: Payer: Self-pay

## 2019-11-20 ENCOUNTER — Inpatient Hospital Stay (HOSPITAL_BASED_OUTPATIENT_CLINIC_OR_DEPARTMENT_OTHER): Payer: Medicaid Other | Admitting: Oncology

## 2019-11-20 ENCOUNTER — Inpatient Hospital Stay: Payer: Medicaid Other

## 2019-11-20 VITALS — BP 121/69 | HR 101 | Temp 98.4°F | Resp 18

## 2019-11-20 DIAGNOSIS — E2740 Unspecified adrenocortical insufficiency: Secondary | ICD-10-CM | POA: Diagnosis not present

## 2019-11-20 DIAGNOSIS — F419 Anxiety disorder, unspecified: Secondary | ICD-10-CM | POA: Diagnosis not present

## 2019-11-20 DIAGNOSIS — G629 Polyneuropathy, unspecified: Secondary | ICD-10-CM | POA: Diagnosis not present

## 2019-11-20 DIAGNOSIS — E876 Hypokalemia: Secondary | ICD-10-CM

## 2019-11-20 DIAGNOSIS — Z90721 Acquired absence of ovaries, unilateral: Secondary | ICD-10-CM | POA: Diagnosis not present

## 2019-11-20 DIAGNOSIS — E860 Dehydration: Secondary | ICD-10-CM

## 2019-11-20 DIAGNOSIS — R197 Diarrhea, unspecified: Secondary | ICD-10-CM

## 2019-11-20 DIAGNOSIS — Z79899 Other long term (current) drug therapy: Secondary | ICD-10-CM | POA: Diagnosis not present

## 2019-11-20 DIAGNOSIS — C7801 Secondary malignant neoplasm of right lung: Secondary | ICD-10-CM | POA: Diagnosis not present

## 2019-11-20 DIAGNOSIS — R1031 Right lower quadrant pain: Secondary | ICD-10-CM

## 2019-11-20 DIAGNOSIS — Z9221 Personal history of antineoplastic chemotherapy: Secondary | ICD-10-CM | POA: Diagnosis not present

## 2019-11-20 DIAGNOSIS — R112 Nausea with vomiting, unspecified: Secondary | ICD-10-CM | POA: Diagnosis not present

## 2019-11-20 DIAGNOSIS — D6851 Activated protein C resistance: Secondary | ICD-10-CM | POA: Diagnosis not present

## 2019-11-20 DIAGNOSIS — Z8249 Family history of ischemic heart disease and other diseases of the circulatory system: Secondary | ICD-10-CM | POA: Diagnosis not present

## 2019-11-20 DIAGNOSIS — R21 Rash and other nonspecific skin eruption: Secondary | ICD-10-CM | POA: Diagnosis not present

## 2019-11-20 DIAGNOSIS — Z86718 Personal history of other venous thrombosis and embolism: Secondary | ICD-10-CM | POA: Diagnosis not present

## 2019-11-20 DIAGNOSIS — C539 Malignant neoplasm of cervix uteri, unspecified: Secondary | ICD-10-CM | POA: Diagnosis present

## 2019-11-20 DIAGNOSIS — Z7952 Long term (current) use of systemic steroids: Secondary | ICD-10-CM | POA: Diagnosis not present

## 2019-11-20 DIAGNOSIS — Z833 Family history of diabetes mellitus: Secondary | ICD-10-CM | POA: Diagnosis not present

## 2019-11-20 DIAGNOSIS — Z7901 Long term (current) use of anticoagulants: Secondary | ICD-10-CM | POA: Diagnosis not present

## 2019-11-20 DIAGNOSIS — R531 Weakness: Secondary | ICD-10-CM

## 2019-11-20 DIAGNOSIS — Z87891 Personal history of nicotine dependence: Secondary | ICD-10-CM | POA: Diagnosis not present

## 2019-11-20 DIAGNOSIS — Z86711 Personal history of pulmonary embolism: Secondary | ICD-10-CM | POA: Diagnosis not present

## 2019-11-20 DIAGNOSIS — Z8616 Personal history of COVID-19: Secondary | ICD-10-CM | POA: Diagnosis not present

## 2019-11-20 DIAGNOSIS — C7802 Secondary malignant neoplasm of left lung: Secondary | ICD-10-CM | POA: Diagnosis not present

## 2019-11-20 LAB — MAGNESIUM: Magnesium: 1.5 mg/dL — ABNORMAL LOW (ref 1.7–2.4)

## 2019-11-20 LAB — COMPREHENSIVE METABOLIC PANEL
ALT: 45 U/L — ABNORMAL HIGH (ref 0–44)
AST: 28 U/L (ref 15–41)
Albumin: 3.4 g/dL — ABNORMAL LOW (ref 3.5–5.0)
Alkaline Phosphatase: 162 U/L — ABNORMAL HIGH (ref 38–126)
Anion gap: 10 (ref 5–15)
BUN: 13 mg/dL (ref 6–20)
CO2: 24 mmol/L (ref 22–32)
Calcium: 8.7 mg/dL — ABNORMAL LOW (ref 8.9–10.3)
Chloride: 96 mmol/L — ABNORMAL LOW (ref 98–111)
Creatinine, Ser: 0.7 mg/dL (ref 0.44–1.00)
GFR calc Af Amer: >60 mL/min (ref >60)
GFR calc non Af Amer: >60 mL/min (ref >60)
Glucose, Bld: 165 mg/dL — ABNORMAL HIGH (ref 70–99)
Potassium: 3.1 mmol/L — ABNORMAL LOW (ref 3.5–5.1)
Sodium: 130 mmol/L — ABNORMAL LOW (ref 135–145)
Total Bilirubin: 0.9 mg/dL (ref 0.3–1.2)
Total Protein: 7 g/dL (ref 6.5–8.1)

## 2019-11-20 LAB — CBC WITH DIFFERENTIAL/PLATELET
Abs Immature Granulocytes: 0.07 10*3/uL (ref 0.00–0.07)
Basophils Absolute: 0 10*3/uL (ref 0.0–0.1)
Basophils Relative: 0 %
Eosinophils Absolute: 0.1 10*3/uL (ref 0.0–0.5)
Eosinophils Relative: 5 %
HCT: 35.5 % — ABNORMAL LOW (ref 36.0–46.0)
Hemoglobin: 11.3 g/dL — ABNORMAL LOW (ref 12.0–15.0)
Immature Granulocytes: 8 %
Lymphocytes Relative: 48 %
Lymphs Abs: 0.4 10*3/uL — ABNORMAL LOW (ref 0.7–4.0)
MCH: 32.6 pg (ref 26.0–34.0)
MCHC: 31.8 g/dL (ref 30.0–36.0)
MCV: 102.3 fL — ABNORMAL HIGH (ref 80.0–100.0)
Monocytes Absolute: 0 10*3/uL — ABNORMAL LOW (ref 0.1–1.0)
Monocytes Relative: 4 %
Neutro Abs: 0.3 10*3/uL — ABNORMAL LOW (ref 1.7–7.7)
Neutrophils Relative %: 35 %
Platelets: 70 10*3/uL — ABNORMAL LOW (ref 150–400)
RBC: 3.47 MIL/uL — ABNORMAL LOW (ref 3.87–5.11)
RDW: 12.2 % (ref 11.5–15.5)
WBC: 0.9 10*3/uL — CL (ref 4.0–10.5)
nRBC: 0 % (ref 0.0–0.2)

## 2019-11-20 MED ORDER — SODIUM CHLORIDE 0.9% FLUSH
10.0000 mL | Freq: Once | INTRAVENOUS | Status: AC
  Administered 2019-11-20: 10 mL via INTRAVENOUS
  Filled 2019-11-20: qty 10

## 2019-11-20 MED ORDER — PREDNISONE 10 MG (21) PO TBPK: ORAL_TABLET | ORAL | 0 refills | Status: DC

## 2019-11-20 MED ORDER — HEPARIN SOD (PORK) LOCK FLUSH 100 UNIT/ML IV SOLN
500.0000 [IU] | Freq: Once | INTRAVENOUS | Status: AC
  Administered 2019-11-20: 500 [IU] via INTRAVENOUS
  Filled 2019-11-20: qty 5

## 2019-11-20 MED ORDER — DEXAMETHASONE SODIUM PHOSPHATE 10 MG/ML IJ SOLN
10.0000 mg | Freq: Once | INTRAMUSCULAR | Status: AC
  Administered 2019-11-20: 10 mg via INTRAVENOUS
  Filled 2019-11-20: qty 1

## 2019-11-20 MED ORDER — SODIUM CHLORIDE 0.9 % IV SOLN
Freq: Once | INTRAVENOUS | Status: AC
  Filled 2019-11-20: qty 250

## 2019-11-20 MED ORDER — TRIAMCINOLONE ACETONIDE 0.5 % EX OINT: 1.0000 "application " | TOPICAL_OINTMENT | Freq: Two times a day (BID) | CUTANEOUS | 0 refills | Status: DC

## 2019-11-20 MED ORDER — ONDANSETRON HCL 4 MG/2ML IJ SOLN
8.0000 mg | Freq: Once | INTRAMUSCULAR | Status: AC
  Administered 2019-11-20: 8 mg via INTRAVENOUS
  Filled 2019-11-20: qty 4

## 2019-11-20 MED ORDER — SODIUM CHLORIDE 0.9 % IV SOLN
Freq: Once | INTRAVENOUS | Status: AC
  Filled 2019-11-20: qty 10

## 2019-11-20 MED ORDER — MORPHINE SULFATE (PF) 2 MG/ML IV SOLN
2.0000 mg | Freq: Once | INTRAVENOUS | Status: AC
  Administered 2019-11-20: 2 mg via INTRAVENOUS
  Filled 2019-11-20: qty 1

## 2019-11-20 NOTE — Progress Notes (Signed)
Symptom Management Consult note [facility]  [phone] [phone]  Patient Care Team: Ross as PCP - General (Family Medicine) Mellody Drown, MD as Consulting Physician (Obstetrics and Gynecology) [name], MD as Consulting Physician (Gastroenterology) Michael Boston, MD as Consulting Physician (General Surgery) Lovell Sheehan, MD as Consulting Physician (Orthopedic Surgery) Cammie Sickle, MD as Consulting Physician (Oncology) Earnestine Leys, MD as Consulting Physician (Orthopedic Surgery)   Name of the patient: Joanna Hall  [id number]  [birth year]   Date of visit: 11/20/2019   Diagnosis-metastatic cervical cancer  Chief complaint/ Reason for visit-diarrhea, nausea, abdominal pain.  Heme/Onc history:  Oncology History Overview Note  # dec 2017- CERVICAL ADENO CA [[place]]; Stage IB [Dr. Christene Slates at [facility] in [place], [place];No surgery- Chemo-RT;   # 2018-sep - lung nodules [in [place]]  # AUG 20th, 2019-#1 Carbo-Taxol s/p 6 cycles- [No avastin sec to blood clots]: Feb 2020-bilateral lung nodules with 1 cm in size. March 2020- finished carbo-taxol #7; poor tolerance to [place].  # April 15 th 2020- Start Taxol weekly x 3; one week OFF;  # July 2020-CT- bil cavitary lesions- slightly bigger by few mm/overall STABLE; CT a/p-NED. 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  She received treatment from 11/2016-05/2017.  01/2017 cisplatin x 2 and carboplatin x 1 (01/29/2017) due to ARF and XRT.  XRT was followed by T & O on 02/01/2017 and T & N 02/10/2017 and 02/20/2017.  Course was complicated by 80 pound weight loss, nausea, vomiting, electrolyte wasting (potassium and magnesium).  She describes that.  Is been sick constantly requiring at least 20 hospitalizations.  Follow-up CT chest and PET on 06/2017. Per patient, 'radiation worked' and no disease in the abdomen.  At that time she was noted to have lung nodules that were growing and follow-up imaging was scheduled for 10/2017.  She was admitted to hospital in [place] for small bowel obstruction which was managed conservatively and she was home for a week prior to traveling to [place] for Thanksgiving holiday where she has family.  She presented to ER in [place] on 08/2017 with nausea, vomiting, and lower abdominal pain.  Symptoms did not respond to conservative treatment.   CT on 08/26/2017 revealed small bowel obstruction with transition in the pelvis just superior to the uterus rather was a long segment of distal ileum with fatty wall thickening compatible with chronic inflammation and/or radiation enteritis. Imaging showed numerous pulmonary nodules consistent with metastatic disease. She underwent laparotomy and right ileocolectomy on 08/31/2017 at [facility].  Surgical findings revealed a thickened, matted, and scarred piece of distal small bowel close to the ileocecal valve.  She was discharged on 09/05/2017.  Pain markedly increased in intensity and imaging was performed on 09/11/2017 which revealed: Debris within the anterior abdominal wall incision concerning for infection versus packing material, s/p post ileo-colectomy with expected postoperative changes, mild colonic ileus, numerous pulmonary nodules highly concerning for  metastatic disease, punctate  nonobstructing nephrolithiasis.  Staples were removed and one was packed.  She was started on doxycycline.  Abdominal and pelvic CT without contrast on 09/11/2017 revealed debris within anterior abdominal wall incision concerning for infection, versus packing material.   She is s/p ileocolectomy with expected postoperative changes and mild colonic ileus.  There were numerous pulmonary nodules highly concerning for metastatic disease and punctate nonobstructing nephrolithiasis. She was readmitted on 09/12/2017.  She describes the onset of lower abdominal pain on 09/09/2017.  Pain markedly increased in intensity on 09/11/2017.   Staples were removed and the wound packed.  She was started on doxycycline.  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   PET scan was ordered to evaluate enlarging lung nodules with concern for recurrent cervical cancer but scan was delayed due to insurance and need to be performed in [place].  Presented to ER on 12/03/2017 for abdominal pain and emesis.  Imaging concerning for worsening possible uterine infarct and she was admitted to hospital.  Pelvic MRI was unremarkable.  Remote scarring type changes of uterus thought to be possibly related to radiation.  She was discharged on 12/08/2017.  Underwent endoscopy and colonoscopy on 12/20/2017.    On 02/22/2018 she underwent laser ablation of condylomata around the anus and vagina under anesthesia with Dr. Johney Maine.   04/15/2018:  Chest, abdomen, and pelvis CT revealed innumerable (> 100) cavitary nodules scattered in the lungs, moderately enlarging  compared to the 11/08/2017 PET-CT, suspicious for metastatic disease.  One index node in the RLL measures 1.0 x 1.1 cm (previously 0.6 x 0.6 cm).  There were no new nodules.  There was an ill-defined wall thickening in the rectosigmoid with surrounding stranding along fascia planes, probably sequela from prior radiation therapy.  There was multilevel lumbar impingement due to spondylosis and degenerative disc disease.  There was heterogeneous enhancement in the uterus (some possibly from prior radiation therapy).   04/23/2018:  PET scan revealed numerous scattered solid and cavitary nodules in the lungs stable increased in size compared to the prior PET-CT from 11/08/2017. Largest nodule was 1.1 cm in the LUL (SUV 1.9).  These demonstrated low-grade metabolic activity up to a maximum SUV of about 2.3, increased from 11/08/2017.    Case was discussed at tumor board on 04/25/2018. Consensus to pursue CT-guided biopsy (05/06/18) which revealed: Metastatic adenocarcinoma, morphologically consistent with cervical adenocarcinoma.  She has history of chronic hepatitis C which is managed by GI.  Hepatitis C genotype is 2a/2c.  She receives B12 injections for history of B12 deficiency.  On 04/24/2018 she underwent left total knee replacement with Dr. [name]. --------------------------------------------------------   DIAGNOSIS: Cervical adenocarcinoma  STAGE: IV        ;GOALS: Palliative  CURRENT/MOST RECENT THERAPY : Alimta [C]    Malignant neoplasm of overlapping sites of cervix ([place])  01/29/2019 - 04/23/2019 Chemotherapy   The patient had PACLitaxel (TAXOL) 156 mg in sodium chloride 0.9 % 250 mL chemo infusion (</= 67m/m2), 80 mg/m2 = 156 mg, Intravenous,  Once, 3 of 4 cycles Dose modification: 65 mg/m2 (original dose 80 mg/m2, Cycle 1, Reason: Provider Judgment) Administration: 156 mg (01/29/2019),  156 mg (02/05/2019), 126 mg (02/12/2019), 126 mg (02/26/2019), 126 mg (03/13/2019), 126 mg (03/27/2019), 126 mg  (03/20/2019), 126 mg (04/03/2019), 126 mg (04/10/2019)  for chemotherapy treatment.    05/08/2019 - 07/30/2019 Chemotherapy   The patient had pembrolizumab (KEYTRUDA) 200 mg in sodium chloride 0.9 % 50 mL chemo infusion, 200 mg, Intravenous, Once, 4 of 6 cycles Administration: 200 mg (05/08/2019), 200 mg (05/29/2019), 200 mg (06/19/2019), 200 mg (07/10/2019)  for chemotherapy treatment.    08/07/2019 -  Chemotherapy   The patient had pegfilgrastim-cbqv [facility]) injection 6 mg, 6 mg, Subcutaneous, Once, 3 of 5 cycles Administration: 6 mg (08/29/2019), 6 mg (09/19/2019) PEMEtrexed (ALIMTA) 1,000 mg in sodium chloride 0.9 % 100 mL chemo infusion, 950 mg, Intravenous,  Once, 4 of 6 cycles Administration: 1,000 mg (08/07/2019), 1,000 mg (09/18/2019), 1,000 mg (11/13/2019), 1,000 mg (08/28/2019)  for chemotherapy treatment.     Interval history-  Eara Burruel is a 54 year old female with the above history of metastatic cervical cancer, followed by palliative care and receiving immunotherapy, presents to symptom management clinic for nausea, vomiting, diarrhea and suprapubic abdominal pain.  Symptoms began after recent Alimta infusion. She has not had treatment since 09/18/2020 d/t intolerance and covid infection. She has a history of chronic colitis and adrenal insufficiency and has suffered similar symptoms in the past.  She reports lower abdominal pain suprapubic region described as sharp intermittent 8 out of 10 in severity.  No radiating, alleviating or aggravating factors.  She takes her pain medication as prescribed as well as her antidiarrheals and antiemetics but symptoms are persistent.  States that she feels weak and believes she is dehydrated and may need some electrolytes.  She also complains of a rash that is pruritic.  Symptoms started on Saturday.  She has tried Benadryl with only minimal relief of symptoms.  She denies any recent fevers.  She denies any blood in her stools.  Her appetite is  decreased but she continues to eat.  She is drinking plenty of fluids.  She denies any urinary concerns.  ECOG FS:1 - Symptomatic but completely ambulatory  Review of systems- Review of Systems  Constitutional: Positive for malaise/fatigue. Negative for chills, fever and weight loss.  HENT: Negative for congestion, ear pain and tinnitus.   Eyes: Negative.  Negative for blurred vision and double vision.  Respiratory: Negative.  Negative for cough, sputum production and shortness of breath.   Cardiovascular: Negative.  Negative for chest pain, palpitations and leg swelling.  Gastrointestinal: Positive for abdominal pain, diarrhea, nausea and vomiting. Negative for constipation.  Genitourinary: Negative for dysuria, frequency and urgency.  Musculoskeletal: Negative for back pain and falls.  Skin: Positive for rash.  Neurological: Positive for weakness. Negative for headaches.  Endo/Heme/Allergies: Negative.  Does not bruise/bleed easily.  Psychiatric/Behavioral: Negative.  Negative for depression. The patient is not nervous/anxious and does not have insomnia.      Current treatment- s/p cycle 4 alimta-last given on 11/13/19  Allergies  Allergen Reactions  . Ketamine Anxiety and Other (See Comments)    Syncope episode/confusion      Past Medical History:  Diagnosis Date  . Abdominal pain 06/10/2018  . Abnormal cervical Papanicolaou smear 09/18/2017  . Anxiety   . Aortic atherosclerosis ([place])   . Arthritis    neck and knees  . 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 Blood transfusion without reported diagnosis   . Cervical cancer ([place]) 09/2016   mets lung  .  Chronic anal fissure   . Chronic diarrhea   . Dyspnea   . Erosive gastropathy 09/18/2017  . Factor V Leiden mutation ([place])   . Fecal incontinence   . Genital warts   . GERD (gastroesophageal reflux disease)   . GI bleed 10/08/2018  . Heart murmur   . Hematochezia   . Hemorrhoids   . 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 Hepatitis, chronic ([place]) 05/05/2017  . History of cancer chemotherapy    completed 06/2017  . History of Clostridium difficile infection    while undergoing chemo.  Negative test 10/2017  . Ileocolic anastomotic leak   . 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 Intestinal infection due to Clostridium difficile 09/18/2017  . Macrocytic anemia with vitamin B12 deficiency   . Multiple gastric ulcers   . Nausea vomiting and diarrhea   . Pancolitis ([place]) 07/27/2018  . Perianal condylomata   . Pneumonia    History of  . Pulmonary nodules   . Rectal bleeding   . Small bowel obstruction ([place]) 08/2017  . Stiff neck    limited right turn  . Vitamin D deficiency      Past Surgical History:  Procedure Laterality Date  . CHOLECYSTECTOMY    . COLON SURGERY  08/2017   resection  . COLONOSCOPY WITH PROPOFOL N/A 12/20/2017   Procedure: COLONOSCOPY WITH PROPOFOL;  Surgeon: [name], MD;  Location: [facility] ENDOSCOPY;  Service: Gastroenterology;  Laterality: N/A;  . COLONOSCOPY WITH PROPOFOL N/A 07/30/2018   Procedure: COLONOSCOPY WITH PROPOFOL;  Surgeon: [name], MD;  Location: [facility] ENDOSCOPY;  Service: Gastroenterology;  Laterality: N/A;  . COLONOSCOPY WITH PROPOFOL N/A 10/10/2018   Procedure: COLONOSCOPY WITH PROPOFOL;  Surgeon: Lucilla Lame, MD;  Location: [facility] ENDOSCOPY;  Service: Endoscopy;  Laterality: N/A;  . DIAGNOSTIC LAPAROSCOPY    . ESOPHAGOGASTRODUODENOSCOPY (EGD) WITH PROPOFOL N/A 12/20/2017   Procedure: ESOPHAGOGASTRODUODENOSCOPY (EGD) WITH PROPOFOL;  Surgeon: [name], MD;  Location: [place];  Service: Gastroenterology;  Laterality: N/A;  . ESOPHAGOGASTRODUODENOSCOPY (EGD) WITH PROPOFOL  07/30/2018   Procedure: ESOPHAGOGASTRODUODENOSCOPY (EGD) WITH PROPOFOL;  Surgeon: [name], MD;  Location: [facility] ENDOSCOPY;  Service: Gastroenterology;;  . Otho Darner SIGMOIDOSCOPY N/A 11/21/2018    Procedure: FLEXIBLE SIGMOIDOSCOPY;  Surgeon: [name], MD;  Location: [facility] ENDOSCOPY;  Service: Gastroenterology;  Laterality: N/A;  . LAPAROTOMY N/A 08/31/2017   Procedure: EXPLORATORY LAPAROTOMY for SBO, ileocolectomy, removal of piece of uterine wall;  Surgeon: [name], MD;  Location: [facility] ORS;  Service: General;  Laterality: N/A;  . LASER ABLATION CONDOLAMATA N/A 02/22/2018   Procedure: LASER ABLATION/REMOVAL OF XNTZGYFVCBS AROUND ANUS AND VAGINA;  Surgeon: Michael Boston, MD;  Location: [place];  Service: General;  Laterality: N/A;  . OOPHORECTOMY    . PORTA CATH INSERTION N/A 05/13/2018   Procedure: PORTA CATH INSERTION;  Surgeon: Katha Cabal, MD;  Location: [place] CV LAB;  Service: Cardiovascular;  Laterality: N/A;  . SMALL INTESTINE SURGERY    . TANDEM RING INSERTION     x3  . THORACOTOMY    . TOTAL KNEE ARTHROPLASTY Left 04/24/2018   Procedure: TOTAL KNEE ARTHROPLASTY;  Surgeon: Lovell Sheehan, MD;  Location: [facility] ORS;  Service: Orthopedics;  Laterality: Left;    Social History   Socioeconomic History  . Marital status: Divorced    Spouse name: Not on file  . Number of children: Not  on file  . Years of education: Not on file  . Highest education level: Not on file  Occupational History  . Not on file  Tobacco Use  . Smoking status: Former Smoker    Packs/day: 0.25    Years: 10.00    Pack years: 2.50    Types: Cigarettes    Quit date: 10/16/2006    Years since quitting: 13.1  . Smokeless tobacco: Never Used  Substance and Sexual Activity  . Alcohol use: Not Currently    Comment: seldom  . Drug use: Yes    Types: Marijuana    Comment: not very often  . Sexual activity: Not Currently    Birth control/protection: Post-menopausal    Comment: Not Asked  Other Topics Concern  . Not on file  Social History Narrative  . Not on file   Social Determinants of Health   Financial Resource Strain:   . Difficulty of Paying  Living Expenses: Not on file  Food Insecurity:   . Worried About Charity fundraiser in the Last Year: Not on file  . Ran Out of Food in the Last Year: Not on file  Transportation Needs:   . Lack of Transportation (Medical): Not on file  . Lack of Transportation (Non-Medical): Not on file  Physical Activity:   . Days of Exercise per Week: Not on file  . Minutes of Exercise per Session: Not on file  Stress:   . Feeling of Stress : Not on file  Social Connections:   . Frequency of Communication with Friends and Family: Not on file  . Frequency of Social Gatherings with Friends and Family: Not on file  . Attends Religious Services: Not on file  . Active Member of Clubs or Organizations: Not on file  . Attends Archivist Meetings: Not on file  . Marital Status: Not on file  Intimate Partner Violence:   . Fear of Current or Ex-Partner: Not on file  . Emotionally Abused: Not on file  . Physically Abused: Not on file  . Sexually Abused: Not on file    Family History  Problem Relation Age of Onset  . Hypertension Mother   . Hypertension Father   . Diabetes Father   . Hyperlipidemia Father   . Alcohol abuse Daughter   . Hypertension Maternal Grandmother   . Diabetes Maternal Grandmother   . Diabetes Paternal Grandmother   . Hypertension Paternal Grandmother      Current Outpatient Medications:  .  amitriptyline (ELAVIL) 100 MG tablet, Take 1 tablet (100 mg total) by mouth at bedtime., Disp: 90 tablet, Rfl: 1 .  Calcium Carb-Cholecalciferol (CALCIUM 500 +D) 500-400 MG-UNIT TABS, Take 2 tablets by mouth daily., Disp: , Rfl:  .  cholestyramine (QUESTRAN) 4 g packet, Take 1 packet (4 g total) by mouth 2 (two) times daily., Disp: 60 each, Rfl: 12 .  diphenoxylate-atropine (LOMOTIL) 2.5-0.025 MG tablet, Take 2 tablets by mouth 2 (two) times daily as needed for diarrhea or loose stools., Disp: , Rfl:  .  famotidine (PEPCID) 20 MG tablet, Take 1 tablet (20 mg total) by mouth 2  (two) times daily., Disp: 60 tablet, Rfl: 0 .  folic acid (FOLVITE) 1 MG tablet, Take 1 tablet (1 mg total) by mouth daily., Disp: 30 tablet, Rfl: 6 .  hydrocortisone (CORTEF) 10 MG tablet, Take 1 tablet (10 mg total) by mouth 2 (two) times daily., Disp: 180 tablet, Rfl: 0 .  hydrOXYzine (ATARAX/VISTARIL) 10 MG tablet, Take 1  tablet (10 mg total) by mouth 3 (three) times daily as needed., Disp: 60 tablet, Rfl: 0 .  hyoscyamine (LEVBID) 0.375 MG 12 hr tablet, Take 1 tablet (0.375 mg total) by mouth 2 (two) times daily as needed for cramping., Disp: , Rfl:  .  ondansetron (ZOFRAN ODT) 4 MG disintegrating tablet, Take 1 tablet (4 mg total) by mouth every 8 (eight) hours as needed for nausea or vomiting., Disp: 20 tablet, Rfl: 0 .  oxyCODONE (OXYCONTIN) 20 mg 12 hr tablet, Take 1 tablet (20 mg total) by mouth every 8 (eight) hours., Disp: 90 tablet, Rfl: 0 .  Oxycodone HCl 10 MG TABS, Take 1-2 tablets (10-20 mg total) by mouth every 6 (six) hours as needed (for breakthrough pain)., Disp: 120 tablet, Rfl: 0 .  pantoprazole (PROTONIX) 40 MG tablet, Take 1 tablet (40 mg total) by mouth daily., Disp: 90 tablet, Rfl: 1 .  potassium chloride SA (KLOR-CON) 20 MEQ tablet, Take 1 tablet (20 mEq total) by mouth 2 (two) times daily., Disp: 30 tablet, Rfl: 0 .  promethazine (PHENERGAN) 25 MG tablet, Take 1 tablet (25 mg total) by mouth every 8 (eight) hours as needed for nausea or vomiting., Disp: 30 tablet, Rfl: 6 .  rivaroxaban (XARELTO) 20 MG TABS tablet, Take 1 tablet (20 mg total) by mouth every morning. Takes in the morning, Disp: , Rfl:  .  sertraline (ZOLOFT) 25 MG tablet, Take 1 tablet (25 mg total) by mouth daily., Disp: 30 tablet, Rfl: 0 .  traMADol (ULTRAM) 50 MG tablet, Take 50 mg by mouth every 6 (six) hours as needed., Disp: , Rfl:  .  VENTOLIN HFA 108 (90 Base) MCG/ACT inhaler, Inhale 1-2 puffs into the lungs every 4 (four) hours as needed for shortness of breath., Disp: 1 Inhaler, Rfl: 1 .   predniSONE (STERAPRED UNI-PAK 21 TAB) 10 MG (21) TBPK tablet, Take as instructed by pharmacy., Disp: 21 tablet, Rfl: 0 .  triamcinolone ointment (KENALOG) 0.5 %, Apply 1 application topically 2 (two) times daily., Disp: 30 g, Rfl: 0 No current facility-administered medications for this visit.  Facility-Administered Medications Ordered in Other Visits:  .  heparin lock flush 100 unit/mL, 500 Units, Intravenous, Once, Corcoran, Melissa C, MD .  sodium chloride 0.9 % 1,000 mL with potassium chloride 20 mEq infusion, , Intravenous, Once, Honor Loh E, NP .  sodium chloride flush (NS) 0.9 % injection 10 mL, 10 mL, Intravenous, Once, Borders, Kirt Boys, NP  Physical exam:  Vitals:   11/20/19 0835  BP: 121/69  Pulse: (!) 101  Resp: 18  Temp: 98.4 F (36.9 C)  TempSrc: Tympanic   Physical Exam Constitutional:      Appearance: Normal appearance.  HENT:     Head: Normocephalic and atraumatic.  Eyes:     Pupils: Pupils are equal, round, and reactive to light.  Cardiovascular:     Rate and Rhythm: Regular rhythm. Tachycardia present.     Heart sounds: Normal heart sounds. No murmur.  Pulmonary:     Effort: Pulmonary effort is normal.     Breath sounds: Normal breath sounds. No wheezing.  Abdominal:     General: Bowel sounds are normal. There is no distension.     Palpations: Abdomen is soft.     Tenderness: There is abdominal tenderness.  Musculoskeletal:        General: Normal range of motion.     Cervical back: Normal range of motion.  Skin:    General: Skin is warm and  dry.     Findings: No rash.  Neurological:     Mental Status: She is alert and oriented to person, place, and time.     Motor: Weakness present.  Psychiatric:        Judgment: Judgment normal.      CMP Latest Ref Rng & Units 11/20/2019  Glucose 70 - 99 mg/dL 165(H)  BUN 6 - 20 mg/dL 13  Creatinine 0.44 - 1.00 mg/dL 0.70  Sodium 135 - 145 mmol/L 130(L)  Potassium 3.5 - 5.1 mmol/L 3.1(L)  Chloride 98 - 111  mmol/L 96(L)  CO2 22 - 32 mmol/L 24  Calcium 8.9 - 10.3 mg/dL 8.7(L)  Total Protein 6.5 - 8.1 g/dL 7.0  Total Bilirubin 0.3 - 1.2 mg/dL 0.9  Alkaline Phos 38 - 126 U/L 162(H)  AST 15 - 41 U/L 28  ALT 0 - 44 U/L 45(H)   CBC Latest Ref Rng & Units 11/20/2019  WBC 4.0 - 10.5 K/uL 0.9(LL)  Hemoglobin 12.0 - 15.0 g/dL 11.3(L)  Hematocrit 36.0 - 46.0 % 35.5(L)  Platelets 150 - 400 K/uL 70(L)    No images are attached to the encounter.  No results found.  Assessment and plan- Patient is a 54 y.o. female diagnosed with metastatic cervical cancer currently on palliative Alimta, who presents to symptom management clinic for complaints of abdominal pain, nausea, vomiting, diarrhea and a rash x1 week.   GI symptoms/pelvic pain: Multifactorial.  Alimta has GI side effects including nausea, anorexia, vomiting and diarrhea.  Patient has significant history for colitis and has been worked up on several different occasions.  Previous stool studies reviewed and were negative.  Previously improved with antibiotics.  Currently on hydrocortisone 20 mg daily.  A.m. cortisol levels checked to rule out adrenal insufficiency which were normal (4.9).  Fecal calprotectin were normal.  Symptoms appear to be improving/stable until last weeks treatment.  Will check lab work and replace electrolytes as needed. Continue previously prescribed antidiarrheals and antiemetics.  Rash: Likely due to Alimta.  Per Up-to-date incidence of skin rash >14%.   Plan: Lab work. (NA 130, K3.1, MG 1.5, ANC 300) Give 1 L NaCl. Give 8 mg Zofran. Give 10 mg dexamethasone Give 2 mg morphine for acute pain. Give 2 g magnesium and 20 meq of K IV.  RX steroid taper X 5 days. RX Kenalog Steroid cream.  Can take benadryl prn.   Disposition:  RTC on 12/04/2019 for labs, md assessment and Cycle 5 altima.  Visit Diagnosis 1. Dehydration   2. Diarrhea, unspecified type   3. Nausea and vomiting, intractability of vomiting not specified,  unspecified vomiting type   4. Hypomagnesemia     Patient expressed understanding and was in agreement with this plan. She also understands that She can call clinic at any time with any questions, concerns, or complaints.   Greater than 50% was spent in counseling and coordination of care with this patient including but not limited to discussion of the relevant topics above (See A&P) including, but not limited to diagnosis and management of acute and chronic medical conditions.   Thank you for allowing me to participate in the care of this very pleasant patient.    Jacquelin Hawking, NP [place] [facility] Cell - [phone] Pager- [phone] 11/20/2019 2:05 PM

## 2019-11-21 ENCOUNTER — Encounter: Payer: Self-pay | Admitting: Oncology

## 2019-11-21 ENCOUNTER — Other Ambulatory Visit: Payer: Self-pay | Admitting: Oncology

## 2019-11-21 MED ORDER — HYDROXYZINE PAMOATE 25 MG PO CAPS: 25.0000 mg | ORAL_CAPSULE | Freq: Three times a day (TID) | ORAL | 0 refills | Status: DC | PRN

## 2019-11-21 NOTE — Progress Notes (Signed)
Patient continue to have persistent itching even after initiating prednisone taper yesterday.  Patient can try hydroxyzine 25 mg 3 times daily prn.   Patient called and notified of new RX.   Faythe Casa, NP 11/21/2019 3:30 PM

## 2019-11-27 ENCOUNTER — Telehealth: Payer: Self-pay | Admitting: *Deleted

## 2019-11-27 NOTE — Telephone Encounter (Signed)
Dr. Jacinto Reap - please advise.

## 2019-11-27 NOTE — Telephone Encounter (Signed)
Request for Hydroxyzine 10 mg from pharmacy. It looks like she was given prescription 11/21/19 for a 25 mg tabs Do you want to refill this so that her dose is a 10 mg and 25 mg?

## 2019-11-27 NOTE — Progress Notes (Signed)
Patient ID: Joanna Hall, female    DOB: 19-Jan-1966, 54 y.o.   MRN: [id number]  PCP: Joanna Malkin, MD  Chief Complaint  Patient presents with  . Surgical Clearance    Knee replacement Right knee    Subjective:   Joanna Hall is a 54 y.o. female, presents to clinic with CC of the following:  Chief Complaint  Patient presents with  . Surgical Clearance    Knee replacement Right knee    HPI:  Patient is a 54 y.o.female who presents for pr-op clearance for planned knee replacement surgery.  She had her left knee replaced on 04/24/18, and now her right knee is extremely problematic, and she very much wants to get it replaced.  It is scheduled for March 10 presently. She noted she has had 4 surgeries since her CA dx.   She was Covid positive one month ago and better presently.  She received antibodies as part of the treatment for Covid, and her only symptom was loss of taste and smell.  That is now back.  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 Was last seen on 11/20/2019 for symptom management clinic for complaints of abdominal pain, nausea, vomiting, diarrhea and a rash x1 week.  She notes she often gets very sick about 2 to 3 days after the chemotherapy, usually lasts at least 2 to 3 days.  Then she starts to feel better.  Plans are to have her last chemotherapy on 2/18 prior to the surgery for her right knee planned March 10, and then not to have chemotherapy for 6 weeks after the surgery.   Patient is on hydrocortisone 20 mg daily in the am and 10 mg in the pm prn for adrenal insufficiency concerns.  She was seen by [facility] endocrinology, with this regimen felt best presently. Fecal calprotectin were normal.  When seen on the 11/20/19 visit about one week after her last chemotherapy treatment, she was to continue previously prescribed  antidiarrheals and antiemetics, with a saline infusion with potassium needed (K+ was 3.1, and Na and Cl low as well). The rash was felt likely due to Alimta.  Per Up-to-date incidence of skin rash >14%. She did then improve. She is to RTC next on 12/04/2019 for labs, md assessment and Cycle 5 altima.  She was found to have factor V Leiden deficiency and was started on Xarelto in 2018  She has history of chronic hepatitis C which is managed by GI.  Hepatitis C genotype is 2a/2c.   It was noted she had bilateral shoulder arthritis-left more than right and was awaiting shoulder replacement. That was felt too difficult to pursue.  Also noted discussed with Dr. Harlow Hall office the surgical planning-especially with history of adrenal sufficiency/bridging anticoagulation.  Adrenal insufficiency-on hydrocortisone 20 mg/day; hydrocortisone in PM as needed.  Stable; discussed need to contacting endocrinology re: stress dose of steroids at surgery.  Also noted patient's hypomagnesemia-secondary platinum chemotherapy. Was stable at that point.  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  Patient denies any significant past cardiac history. I did note frequent ECG's obtained and patient noted often gets chest pains when very sick after chemotherapy with the N/V and often when ECG's obtained. She did  note a h/o a heart murmur.    Patient Active Problem List   Diagnosis Date Noted  . Moderate episode of recurrent major depressive disorder ([place]) 11/13/2019  . Acute colitis 09/25/2019  . Anxiety and depression   . Thrombocytopenia ([place]) 08/17/2019  . Intractable lower abdominal pain   . Abdominal pain 07/23/2019  . Constipation 07/22/2019  . Malnutrition of moderate degree 06/22/2019  . Small bowel obstruction due to adhesions ([place]) 06/21/2019  . Acute gastroenteritis  06/14/2019  . Intractable nausea and vomiting 06/07/2019  . Chest pain 05/19/2019  . Intractable pain 05/19/2019  . Cancer associated pain   . Proctitis, radiation   . Palliative care encounter   . Encounter for monitoring opioid maintenance therapy 11/19/2018  . Adrenal insufficiency ([place]) 09/03/2018  . Nausea vomiting and diarrhea   . Chronic diarrhea 06/25/2018  . Chronic anticoagulation 06/25/2018  . Vomiting   . Encounter for antineoplastic chemotherapy 06/04/2018  . 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 Lung metastasis ([place]) 05/15/2018  . Closed fracture of distal end of radius 05/04/2018  . History of total knee arthroplasty 04/24/2018  . Osteoarthritis of left knee 02/28/2018  . Condyloma acuminatum of anus s/p ablation 02/22/2018 02/22/2018  . Condyloma acuminatum of vagina s/p ablation 02/22/2018 02/22/2018  . Positive ANA (antinuclear antibody) 02/04/2018  . Aortic atherosclerosis ([place]) 01/15/2018  . Elevated MCV 01/15/2018  . Anemia 01/15/2018  . Genital warts 01/15/2018  . Vitamin D deficiency 01/15/2018  . Pernicious anemia   . Impingement syndrome of shoulder region 12/19/2017  . Multiple joint pain 12/19/2017  . Osteoarthritis of right knee 12/19/2017  . B12 deficiency 12/11/2017  . Lung nodules 12/11/2017  . History of Clostridium difficile infection 10/23/2017  . Factor V Leiden ([place]) 10/19/2017  . Goals of care, counseling/discussion 10/19/2017  . Cervical cancer, FIGO stage IB1 ([place]) 09/18/2017  . Chronic hepatitis C without hepatic coma ([place]) 09/18/2017  . Cytopenia 09/18/2017  . Diarrhea 09/18/2017  . Elevated LFTs 09/18/2017  . Lower abdominal pain 09/18/2017  . 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 Malignant neoplasm of overlapping sites of cervix ([place]) 09/18/2017  . Renal insufficiency 09/18/2017  . Wound infection after surgery 09/12/2017  . Hypokalemia   . Hypomagnesemia   . Cervical arthritis 07/18/2017  . Dysuria 06/20/2017  . Metastasis  from cervical cancer ([place]) 05/12/2017  . Essential hypertension 03/15/2017  . Anemia of chronic disease 03/01/2017  . Chemotherapy-induced neutropenia ([place]) 01/29/2017  . 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[place]) 09/25/2016      Current Outpatient Medications:  .  amitriptyline (ELAVIL) 100 MG tablet, Take 1 tablet (100 mg total) by mouth at bedtime., Disp: 90 tablet, Rfl: 1 .  amLODipine (NORVASC) 10 MG tablet, Take by mouth., Disp: , Rfl:  .  Calcium Carb-Cholecalciferol (CALCIUM 500 +D) 500-400 MG-UNIT TABS, Take 2 tablets by mouth daily., Disp: , Rfl:  .  colchicine 0.6 MG tablet, Take by mouth., Disp: , Rfl:  .  cyanocobalamin (,VITAMIN B-12,) 1000 MCG/ML injection, Inject into the muscle., Disp: , Rfl:  .  dexamethasone (DECADRON) 4 MG tablet, Take as directed. 1 pill q am and q pm x 3 days; start the day prior to chemo, Disp: , Rfl:  .  diphenoxylate-atropine (LOMOTIL) 2.5-0.025 MG tablet, Take 2 tablets by mouth 2 (two) times daily as needed for diarrhea or loose stools., Disp: , Rfl:  .  famotidine (PEPCID) 20 MG tablet, Take 1 tablet (20 mg total) by mouth 2 (two) times daily., Disp: 60 tablet,  Rfl: 0 .  folic acid (FOLVITE) 1 MG tablet, Take 1 tablet (1 mg total) by mouth daily., Disp: 30 tablet, Rfl: 6 .  hydrocortisone (CORTEF) 10 MG tablet, Take 1 tablet (10 mg total) by mouth 2 (two) times daily., Disp: 180 tablet, Rfl: 0 .  hydrOXYzine (ATARAX/VISTARIL) 10 MG tablet, Take 1 tablet (10 mg total) by mouth 3 (three) times daily as needed., Disp: 60 tablet, Rfl: 0 .  hydrOXYzine (VISTARIL) 25 MG capsule, Take 1 capsule (25 mg total) by mouth 3 (three) times daily as needed for itching., Disp: 30 capsule, Rfl: 0 .  hyoscyamine (LEVBID) 0.375 MG 12 hr tablet, Take 1 tablet (0.375 mg total) by mouth 2 (two) times daily as needed for cramping., Disp: , Rfl:  .  meloxicam (MOBIC) 15 MG tablet, meloxicam 15 mg tablet  Take 1 tablet every day by oral route., Disp: , Rfl:  .  Multiple  Vitamins-Minerals (SUPER THERA VITE M) TABS, Take by mouth., Disp: , Rfl:  .  ondansetron (ZOFRAN ODT) 4 MG disintegrating tablet, Take 1 tablet (4 mg total) by mouth every 8 (eight) hours as needed for nausea or vomiting., Disp: 20 tablet, Rfl: 0 .  oxyCODONE (OXYCONTIN) 20 mg 12 hr tablet, Take 1 tablet (20 mg total) by mouth every 8 (eight) hours., Disp: 90 tablet, Rfl: 0 .  Oxycodone HCl 10 MG TABS, Take 1-2 tablets (10-20 mg total) by mouth every 6 (six) hours as needed (for breakthrough pain)., Disp: 120 tablet, Rfl: 0 .  pantoprazole (PROTONIX) 40 MG tablet, Take 1 tablet (40 mg total) by mouth daily., Disp: 90 tablet, Rfl: 1 .  potassium chloride SA (KLOR-CON) 20 MEQ tablet, Take 1 tablet (20 mEq total) by mouth 2 (two) times daily., Disp: 30 tablet, Rfl: 0 .  predniSONE (STERAPRED UNI-PAK 21 TAB) 10 MG (21) TBPK tablet, Take as instructed by pharmacy., Disp: 21 tablet, Rfl: 0 .  promethazine (PHENERGAN) 25 MG tablet, Take 1 tablet (25 mg total) by mouth every 8 (eight) hours as needed for nausea or vomiting., Disp: 30 tablet, Rfl: 6 .  rivaroxaban (XARELTO) 20 MG TABS tablet, Take 1 tablet (20 mg total) by mouth every morning. Takes in the morning, Disp: , Rfl:  .  sertraline (ZOLOFT) 25 MG tablet, Take 1 tablet (25 mg total) by mouth daily., Disp: 30 tablet, Rfl: 0 .  traMADol (ULTRAM) 50 MG tablet, Take 50 mg by mouth every 6 (six) hours as needed., Disp: , Rfl:  .  triamcinolone ointment (KENALOG) 0.5 %, Apply 1 application topically 2 (two) times daily., Disp: 30 g, Rfl: 0 .  VENTOLIN HFA 108 (90 Base) MCG/ACT inhaler, Inhale 1-2 puffs into the lungs every 4 (four) hours as needed for shortness of breath., Disp: 1 Inhaler, Rfl: 1 No current facility-administered medications for this visit.  Facility-Administered Medications Ordered in Other Visits:  .  heparin lock flush 100 unit/mL, 500 Units, Intravenous, Once, Corcoran, Melissa C, MD .  sodium chloride 0.9 % 1,000 mL with potassium  chloride 20 mEq infusion, , Intravenous, Once, Honor Loh E, NP .  sodium chloride flush (NS) 0.9 % injection 10 mL, 10 mL, Intravenous, Once, Borders, Kirt Boys, NP   Allergies  Allergen Reactions  . Ketamine Anxiety and Other (See Comments)    Syncope episode/confusion      Past Surgical History:  Procedure Laterality Date  . CHOLECYSTECTOMY    . COLON SURGERY  08/2017   resection  . COLONOSCOPY WITH PROPOFOL N/A 12/20/2017  Procedure: COLONOSCOPY WITH PROPOFOL;  Surgeon: [name], MD;  [facility] ENDOSCOPY;  Service: Gastroenterology;  Laterality: N/A;  . COLONOSCOPY WITH PROPOFOL N/A 07/30/2018   Procedure: COLONOSCOPY WITH PROPOFOL;  Surgeon: [name], MD;  Location: [facility] ENDOSCOPY;  Service: Gastroenterology;  Laterality: N/A;  . COLONOSCOPY WITH PROPOFOL N/A 10/10/2018   Procedure: COLONOSCOPY WITH PROPOFOL;  Surgeon: Lucilla Lame, MD;  Location: [facility] ENDOSCOPY;  Service: Endoscopy;  Laterality: N/A;  . DIAGNOSTIC LAPAROSCOPY    . ESOPHAGOGASTRODUODENOSCOPY (EGD) WITH PROPOFOL N/A 12/20/2017   Procedure: ESOPHAGOGASTRODUODENOSCOPY (EGD) WITH PROPOFOL;  Surgeon: [name], MD;  Location: [place];  Service: Gastroenterology;  Laterality: N/A;  . ESOPHAGOGASTRODUODENOSCOPY (EGD) WITH PROPOFOL  07/30/2018   Procedure: ESOPHAGOGASTRODUODENOSCOPY (EGD) WITH PROPOFOL;  Surgeon: [name], MD;  Location: [facility] ENDOSCOPY;  Service: Gastroenterology;;  . Otho Darner SIGMOIDOSCOPY N/A 11/21/2018   Procedure: FLEXIBLE SIGMOIDOSCOPY;  Surgeon: [name], MD;  Location: [facility] ENDOSCOPY;  Service: Gastroenterology;  Laterality: N/A;  . LAPAROTOMY N/A 08/31/2017   Procedure: EXPLORATORY LAPAROTOMY for SBO, ileocolectomy, removal of piece of uterine wall;  Surgeon: [name], MD;  Location: [facility] ORS;  Service: General;  Laterality: N/A;  . LASER ABLATION CONDOLAMATA N/A 02/22/2018   Procedure: LASER ABLATION/REMOVAL OF [id number] AROUND  ANUS AND VAGINA;  Surgeon: Michael Boston, MD;  Location: [place];  Service: General;  Laterality: N/A;  . OOPHORECTOMY    . PORTA CATH INSERTION N/A 05/13/2018   Procedure: PORTA CATH INSERTION;  Surgeon: Katha Cabal, MD;  Location: [place] CV LAB;  Service: Cardiovascular;  Laterality: N/A;  . SMALL INTESTINE SURGERY    . TANDEM RING INSERTION     x3  . THORACOTOMY    . TOTAL KNEE ARTHROPLASTY Left 04/24/2018   Procedure: TOTAL KNEE ARTHROPLASTY;  Surgeon: Lovell Sheehan, MD;  Location: [facility] ORS;  Service: Orthopedics;  Laterality: Left;     Family History  Problem Relation Age of Onset  . Hypertension Mother   . Hypertension Father   . Diabetes Father   . Hyperlipidemia Father   . Alcohol abuse Daughter   . Hypertension Maternal Grandmother   . Diabetes Maternal Grandmother   . Diabetes Paternal Grandmother   . Hypertension Paternal Grandmother      Social History   Socioeconomic History  . Marital status: Divorced    Spouse name: Not on file  . Number of children: Not on file  . Years of education: Not on file  . Highest education level: Not on file  Occupational History  . Not on file  Tobacco Use  . Smoking status: Former Smoker    Packs/day: 0.25    Years: 10.00    Pack years: 2.50    Types: Cigarettes    Quit date: 10/16/2006    Years since quitting: 13.1  . Smokeless tobacco: Never Used  Substance and Sexual Activity  . Alcohol use: Not Currently    Comment: seldom  . Drug use: Yes    Types: Marijuana    Comment: not very often  . Sexual activity: Not Currently    Birth control/protection: Post-menopausal    Comment: Not Asked  Other Topics Concern  . Not on file  Social History Narrative  . Not on file   Social Determinants of Health   Financial Resource Strain:   . Difficulty of Paying Living Expenses: Not on file  Food Insecurity:   . Worried About Charity fundraiser in the Last Year: Not  on file  . Ran Out of  Food in the Last Year: Not on file  Transportation Needs:   . Lack of Transportation (Medical): Not on file  . Lack of Transportation (Non-Medical): Not on file  Physical Activity:   . Days of Exercise per Week: Not on file  . Minutes of Exercise per Session: Not on file  Stress:   . Feeling of Stress : Not on file  Social Connections:   . Frequency of Communication with Friends and Family: Not on file  . Frequency of Social Gatherings with Friends and Family: Not on file  . Attends Religious Services: Not on file  . Active Member of Clubs or Organizations: Not on file  . Attends Archivist Meetings: Not on file  . Marital Status: Not on file  Intimate Partner Violence:   . Fear of Current or Ex-Partner: Not on file  . Emotionally Abused: Not on file  . Physically Abused: Not on file  . Sexually Abused: Not on file    With staff assistance, above reviewed with the patient/caregiver today.  ROS: As per HPI, otherwise no specific complaints on a limited and focused system review   No results found. However, due to the size of the patient record, not all encounters were searched. Please check Results Review for a complete set of results.   PHQ2/9: Depression screen [facility] 2/9 11/28/2019 08/21/2019 05/30/2019  Decreased Interest 0 0 0  Down, Depressed, Hopeless 0 1 0  PHQ - 2 Score 0 1 0  Altered sleeping 0 3 0  Tired, decreased energy 0 3 0  Change in appetite 0 3 0  Feeling bad or failure about yourself  0 0 0  Trouble concentrating 0 0 0  Moving slowly or fidgety/restless 0 0 0  Suicidal thoughts 0 0 0  PHQ-9 Score 0 10 0  Difficult doing work/chores Not difficult at all Not difficult at all Not difficult at all  Some recent data might be hidden   PHQ-2/9 Result is neg  Fall Risk: Fall Risk  11/28/2019 08/21/2019 07/10/2019 05/30/2019 11/19/2018  Falls in the past year? 0 0 0 0 0  Number falls in past yr: 0 0 - 0 -  Injury with Fall? 0 0 - 0 -      Objective:    Vitals:   11/28/19 0843  BP: 118/80  Pulse: 98  Resp: 20  Temp: 98.2 F (36.8 C)  TempSrc: Temporal  SpO2: 99%  Weight: 179 lb 9.6 oz (81.5 kg)  Height: 5' 8"  (1.727 m)    Body mass index is 27.31 kg/m.  Physical Exam   NAD, masked, pleasant HEENT - [place]/AT, sclera anicteric, PERRL, EOMI, conj - non-inj'ed, TM's and canals clear, pharynx clear Neck - supple, no adenopathy, no TM, carotids 2+ and = without bruits bilat Car - RRR, no loud murmur heard, no S3,S4 Pulm- RR and effort normal at rest, CTA without wheeze or rales Abd - soft, NT, ND, BS+,  no masses Back - no CVA tenderness Skin- no rash noted on exposed areas  Ext - no LE edema,  Neuro/psychiatric - affect was not flat, appropriate with conversation  Alert and oriented  Grossly non-focal - good strength on testing extremities, sensation intact to LT in distal extremities  Romberg neg, could balance briefly on one foot, F to N deliberate and adequate  Speech is normal  ECG from 10/18/2019 reviewed - NSR, no acute changes of concern  Labs on 2/4,  about one week after last chemotherapy  Results for orders placed or performed in visit on 11/20/19  Magnesium  Result Value Ref Range   Magnesium 1.5 (L) 1.7 - 2.4 mg/dL  Comprehensive metabolic panel  Result Value Ref Range   Sodium 130 (L) 135 - 145 mmol/L   Potassium 3.1 (L) 3.5 - 5.1 mmol/L   Chloride 96 (L) 98 - 111 mmol/L   CO2 24 22 - 32 mmol/L   Glucose, Bld 165 (H) 70 - 99 mg/dL   BUN 13 6 - 20 mg/dL   Creatinine, Ser 0.70 0.44 - 1.00 mg/dL   Calcium 8.7 (L) 8.9 - 10.3 mg/dL   Total Protein 7.0 6.5 - 8.1 g/dL   Albumin 3.4 (L) 3.5 - 5.0 g/dL   AST 28 15 - 41 U/L   ALT 45 (H) 0 - 44 U/L   Alkaline Phosphatase 162 (H) 38 - 126 U/L   Total Bilirubin 0.9 0.3 - 1.2 mg/dL   GFR calc non Af Amer >60 >60 mL/min   GFR calc Af Amer >60 >60 mL/min   Anion gap 10 5 - 15  CBC with Differential  Result Value Ref Range   WBC 0.9 (LL) 4.0 - 10.5 K/uL   RBC 3.47  (L) 3.87 - 5.11 MIL/uL   Hemoglobin 11.3 (L) 12.0 - 15.0 g/dL   HCT 35.5 (L) 36.0 - 46.0 %   MCV 102.3 (H) 80.0 - 100.0 fL   MCH 32.6 26.0 - 34.0 pg   MCHC 31.8 30.0 - 36.0 g/dL   RDW 12.2 11.5 - 15.5 %   Platelets 70 (L) 150 - 400 K/uL   nRBC 0.0 0.0 - 0.2 %   Neutrophils Relative % 35 %   Neutro Abs 0.3 (L) 1.7 - 7.7 K/uL   Lymphocytes Relative 48 %   Lymphs Abs 0.4 (L) 0.7 - 4.0 K/uL   Monocytes Relative 4 %   Monocytes Absolute 0.0 (L) 0.1 - 1.0 K/uL   Eosinophils Relative 5 %   Eosinophils Absolute 0.1 0.0 - 0.5 K/uL   Basophils Relative 0 %   Basophils Absolute 0.0 0.0 - 0.1 K/uL   WBC Morphology TOO FEW TO COUNT.  SMEAR AVAILABLE FOR REVIEW    Smear Review MORPHOLOGY UNREMARKABLE    Immature Granulocytes 8 %   Abs Immature Granulocytes 0.07 0.00 - 0.07 K/uL   *Note: Due to a large number of results and/or encounters for the requested time period, some results have not been displayed. A complete set of results can be found in Results Review.       Assessment & Plan:   1. Preoperative examination This is my first meeting with Joanna Hall and I am new to the practice which has served as her primary care provider.  I was asked to evaluate her prior to this planned total knee procedure on March 10.  In my opinion, she falls into the high risk category for this procedure.  She has a history of factor V Leiden, also blood clots and has been chronically anticoagulated in the recent past.  If any surgery undertaken, she will need bridged with a Lovenox type product and would recommend a hematologist involved to help with this.  She also has adrenal insufficiency, on chronic doses of hydrocortisone daily at present, and having an endocrinologist involved to help with stress doses of steroids for the surgery if undertaken is also important.  Also, patient has a history of hypomagnesemia due to the chemotherapy administered.  In addition to her persistent anemia of chronic disease, she  has leukopenia and thrombocytopenia after the chemotherapy events, with planned surgeries while in the midst of chemotherapy regimens presents obvious risk.  I defer to Dr. Rogue Bussing, her oncologist, as to the ability to stop the chemotherapy regimen and the appropriate timing to do so to hopefully lessen the risk.  2. Malignant neoplasm of overlapping sites of cervix ([place]) As above, metastatic and currently receiving chemotherapy  3. Malignant neoplasm metastatic to lung, unspecified laterality ([place]) As above  4. Adrenal insufficiency ([place]) She also has adrenal insufficiency, on chronic doses of hydrocortisone daily at present, and having an endocrinologist involved to help with stress doses of steroids for the surgery if undertaken is also important 5. Factor V Leiden ([place]) She has a history of factor V Leiden, also blood clots and has been chronically anticoagulated in the recent past.  If any surgery undertaken, she will need bridged with a Lovenox type product and would recommend a hematologist involved to help with this.  6. Chronic anticoagulation Has a h/o DVT and PE and clot noted in kidney in past also  7. Anemia of chronic disease   8. Cytopenia hx With chemotherapy  9. Thrombocytopenia (HCC) Plt count 70K one week after last chemotherapy noted  10. Chronic hepatitis C without hepatic coma (HCC) Currently not treated during her  recent chemotherapy regimen  11. Hypomagnesemia Likely due to the chemotherapy       Joanna Malkin, MD 11/28/19 8:56 AM

## 2019-11-28 ENCOUNTER — Encounter: Payer: Self-pay | Admitting: Internal Medicine

## 2019-11-28 ENCOUNTER — Other Ambulatory Visit: Payer: Self-pay

## 2019-11-28 ENCOUNTER — Ambulatory Visit: Payer: Medicaid Other | Admitting: Internal Medicine

## 2019-11-28 ENCOUNTER — Other Ambulatory Visit: Payer: Self-pay | Admitting: Oncology

## 2019-11-28 VITALS — BP 118/80 | HR 98 | Temp 98.2°F | Resp 20 | Ht 68.0 in | Wt 179.6 lb

## 2019-11-28 DIAGNOSIS — Z01818 Encounter for other preprocedural examination: Secondary | ICD-10-CM

## 2019-11-28 DIAGNOSIS — C538 Malignant neoplasm of overlapping sites of cervix uteri: Secondary | ICD-10-CM | POA: Diagnosis not present

## 2019-11-28 DIAGNOSIS — C7800 Secondary malignant neoplasm of unspecified lung: Secondary | ICD-10-CM | POA: Diagnosis not present

## 2019-11-28 DIAGNOSIS — E2740 Unspecified adrenocortical insufficiency: Secondary | ICD-10-CM | POA: Diagnosis not present

## 2019-11-28 DIAGNOSIS — D696 Thrombocytopenia, unspecified: Secondary | ICD-10-CM

## 2019-11-28 DIAGNOSIS — B182 Chronic viral hepatitis C: Secondary | ICD-10-CM

## 2019-11-28 DIAGNOSIS — D759 Disease of blood and blood-forming organs, unspecified: Secondary | ICD-10-CM

## 2019-11-28 DIAGNOSIS — Z7901 Long term (current) use of anticoagulants: Secondary | ICD-10-CM

## 2019-11-28 DIAGNOSIS — D638 Anemia in other chronic diseases classified elsewhere: Secondary | ICD-10-CM

## 2019-11-28 DIAGNOSIS — D6851 Activated protein C resistance: Secondary | ICD-10-CM

## 2019-11-28 NOTE — Telephone Encounter (Signed)
Yes I can. What medication is it?  Faythe Casa, NP 11/28/2019 11:06 AM

## 2019-11-28 NOTE — Telephone Encounter (Signed)
Hydroxyzine- see phone note below

## 2019-11-28 NOTE — Telephone Encounter (Signed)
Sonia Baller - can you look into this?

## 2019-12-01 ENCOUNTER — Encounter: Payer: Self-pay | Admitting: Internal Medicine

## 2019-12-01 NOTE — Telephone Encounter (Signed)
Per VERBAL ORDER J Burns, do not refill the !0 mg Hydroxyzine tabs. Pharmacy notified

## 2019-12-02 ENCOUNTER — Telehealth: Payer: Self-pay | Admitting: Internal Medicine

## 2019-12-02 NOTE — Telephone Encounter (Signed)
On 2/15 spoke to pt re: clearance for surgery; await appt as planned this week; surgery on right knee planned on march 10th.   GB

## 2019-12-03 ENCOUNTER — Encounter: Payer: Self-pay | Admitting: Oncology

## 2019-12-03 ENCOUNTER — Other Ambulatory Visit: Payer: Self-pay | Admitting: Hospice and Palliative Medicine

## 2019-12-03 ENCOUNTER — Other Ambulatory Visit: Payer: Self-pay

## 2019-12-03 ENCOUNTER — Encounter: Payer: Self-pay | Admitting: Internal Medicine

## 2019-12-03 ENCOUNTER — Other Ambulatory Visit: Payer: Self-pay | Admitting: *Deleted

## 2019-12-03 ENCOUNTER — Other Ambulatory Visit: Payer: Self-pay | Admitting: Orthopedic Surgery

## 2019-12-03 DIAGNOSIS — F1190 Opioid use, unspecified, uncomplicated: Secondary | ICD-10-CM

## 2019-12-03 MED ORDER — OXYCODONE HCL ER 20 MG PO T12A: 20.0000 mg | EXTENDED_RELEASE_TABLET | Freq: Three times a day (TID) | ORAL | 0 refills | Status: DC

## 2019-12-03 NOTE — Progress Notes (Signed)
PDMP reviewed. Patient is receiving tramadol and received Norco 2 days ago by ortho. She is due for refill of OxyContin but will hold on refilling oxycodone until she can be seen in the clinic and we can discuss her long term opioid regimen.   Case and plan discussed with Dr. Rogue Bussing.

## 2019-12-03 NOTE — Telephone Encounter (Signed)
Patient sent 2nd mychart msg requesting narcotic rf

## 2019-12-04 ENCOUNTER — Encounter: Payer: Self-pay | Admitting: Internal Medicine

## 2019-12-04 ENCOUNTER — Telehealth: Payer: Self-pay | Admitting: *Deleted

## 2019-12-04 ENCOUNTER — Telehealth: Payer: Self-pay | Admitting: Internal Medicine

## 2019-12-04 ENCOUNTER — Inpatient Hospital Stay: Payer: Medicaid Other

## 2019-12-04 ENCOUNTER — Inpatient Hospital Stay: Payer: Medicaid Other | Admitting: Internal Medicine

## 2019-12-04 NOTE — Telephone Encounter (Signed)
On 2/19 spoke to pt re: follow up next week- labs; no chemo- given plans for upcoming surgery- will plan to hold chemo. Pt in agreement. GB

## 2019-12-04 NOTE — Telephone Encounter (Signed)
Patient sent mychart msg to discuss her treatment recommendations from [facility] endocrinology- Dr. Kenton Kingfisher. Dr. B Please review pt's my chart msg. And provide recommendations.

## 2019-12-05 ENCOUNTER — Other Ambulatory Visit: Payer: Self-pay | Admitting: Internal Medicine

## 2019-12-09 ENCOUNTER — Telehealth: Payer: Self-pay | Admitting: *Deleted

## 2019-12-09 ENCOUNTER — Encounter: Payer: Self-pay | Admitting: Internal Medicine

## 2019-12-09 NOTE — Telephone Encounter (Signed)
Unlikely related to her malignancy or treatment. Recommend she follow up with her PCP regarding symptoms.

## 2019-12-09 NOTE — Telephone Encounter (Signed)
Spoke with patient regarding Joanna Hall's response and she states she will go to an Urgent care r walk in clinic

## 2019-12-09 NOTE — Telephone Encounter (Signed)
Patient called reporting that her mouth is raw and her throat is sore too. She called her PCP who cannot see her until Friday and she also thinks she needs IV fluids. She is asking if she can be seen at our office today. Please advise

## 2019-12-10 NOTE — Progress Notes (Signed)
Patient needs to discuss pain management. She is planning a knee surgery and needs to discuss the timing of lovenox bridge. Also dx with thrush which is contributing to her sore throat and cough. She is  currently being tx with nystatin and tessalon pearls.  She did test positive for covid in the viral screen. Dr. Rogue Bussing informed. However, given the fact that her infection was in jan. 2021 and she was treated with monoclonal antibodies, this positive result is a normal expectation status post treatment.

## 2019-12-11 ENCOUNTER — Other Ambulatory Visit: Payer: Self-pay

## 2019-12-11 ENCOUNTER — Ambulatory Visit
Admission: RE | Admit: 2019-12-11 | Discharge: 2019-12-11 | Disposition: A | Discharge status: Home / Self Care | Payer: Medicaid Other | Source: Ambulatory Visit | Attending: Internal Medicine | Admitting: Internal Medicine

## 2019-12-11 ENCOUNTER — Inpatient Hospital Stay (HOSPITAL_BASED_OUTPATIENT_CLINIC_OR_DEPARTMENT_OTHER): Payer: Medicaid Other | Admitting: Internal Medicine

## 2019-12-11 ENCOUNTER — Inpatient Hospital Stay: Payer: Medicaid Other

## 2019-12-11 ENCOUNTER — Other Ambulatory Visit: Payer: Self-pay | Admitting: Licensed Clinical Social Worker

## 2019-12-11 VITALS — BP 143/95 | HR 105 | Temp 97.7°F | Wt 173.0 lb

## 2019-12-11 DIAGNOSIS — C538 Malignant neoplasm of overlapping sites of cervix uteri: Secondary | ICD-10-CM | POA: Insufficient documentation

## 2019-12-11 DIAGNOSIS — R059 Cough, unspecified: Secondary | ICD-10-CM

## 2019-12-11 DIAGNOSIS — R05 Cough: Secondary | ICD-10-CM | POA: Insufficient documentation

## 2019-12-11 DIAGNOSIS — F1190 Opioid use, unspecified, uncomplicated: Secondary | ICD-10-CM

## 2019-12-11 DIAGNOSIS — C539 Malignant neoplasm of cervix uteri, unspecified: Secondary | ICD-10-CM | POA: Diagnosis not present

## 2019-12-11 LAB — COMPREHENSIVE METABOLIC PANEL
ALT: 40 U/L (ref 0–44)
AST: 39 U/L (ref 15–41)
Albumin: 3.7 g/dL (ref 3.5–5.0)
Alkaline Phosphatase: 171 U/L — ABNORMAL HIGH (ref 38–126)
Anion gap: 7 (ref 5–15)
BUN: 12 mg/dL (ref 6–20)
CO2: 27 mmol/L (ref 22–32)
Calcium: 8.9 mg/dL (ref 8.9–10.3)
Chloride: 103 mmol/L (ref 98–111)
Creatinine, Ser: 0.86 mg/dL (ref 0.44–1.00)
GFR calc Af Amer: >60 mL/min (ref >60)
GFR calc non Af Amer: >60 mL/min (ref >60)
Glucose, Bld: 88 mg/dL (ref 70–99)
Potassium: 4.3 mmol/L (ref 3.5–5.1)
Sodium: 137 mmol/L (ref 135–145)
Total Bilirubin: 0.5 mg/dL (ref 0.3–1.2)
Total Protein: 7.5 g/dL (ref 6.5–8.1)

## 2019-12-11 LAB — CBC WITH DIFFERENTIAL/PLATELET
Abs Immature Granulocytes: 0.02 10*3/uL (ref 0.00–0.07)
Basophils Absolute: 0 10*3/uL (ref 0.0–0.1)
Basophils Relative: 0 %
Eosinophils Absolute: 0 10*3/uL (ref 0.0–0.5)
Eosinophils Relative: 1 %
HCT: 39.4 % (ref 36.0–46.0)
Hemoglobin: 12.5 g/dL (ref 12.0–15.0)
Immature Granulocytes: 1 %
Lymphocytes Relative: 20 %
Lymphs Abs: 0.8 10*3/uL (ref 0.7–4.0)
MCH: 33.1 pg (ref 26.0–34.0)
MCHC: 31.7 g/dL (ref 30.0–36.0)
MCV: 104.2 fL — ABNORMAL HIGH (ref 80.0–100.0)
Monocytes Absolute: 0.4 10*3/uL (ref 0.1–1.0)
Monocytes Relative: 11 %
Neutro Abs: 2.6 10*3/uL (ref 1.7–7.7)
Neutrophils Relative %: 67 %
Platelets: 179 10*3/uL (ref 150–400)
RBC: 3.78 MIL/uL — ABNORMAL LOW (ref 3.87–5.11)
RDW: 13.7 % (ref 11.5–15.5)
WBC: 3.8 10*3/uL — ABNORMAL LOW (ref 4.0–10.5)
nRBC: 0 % (ref 0.0–0.2)

## 2019-12-11 LAB — URINE DRUG SCREEN, QUALITATIVE (ARMC ONLY)
Amphetamines, Ur Screen: NOT DETECTED
Barbiturates, Ur Screen: NOT DETECTED
Benzodiazepine, Ur Scrn: POSITIVE — AB
Cannabinoid 50 Ng, Ur ~~LOC~~: POSITIVE — AB
Cocaine Metabolite,Ur ~~LOC~~: NOT DETECTED
MDMA (Ecstasy)Ur Screen: NOT DETECTED
Methadone Scn, Ur: NOT DETECTED
Opiate, Ur Screen: NOT DETECTED
Phencyclidine (PCP) Ur S: NOT DETECTED
Tricyclic, Ur Screen: POSITIVE — AB

## 2019-12-11 LAB — MAGNESIUM: Magnesium: 1.6 mg/dL — ABNORMAL LOW (ref 1.7–2.4)

## 2019-12-11 MED ORDER — FLUCONAZOLE 100 MG PO TABS: 100.0000 mg | ORAL_TABLET | Freq: Every day | ORAL | 0 refills | Status: DC

## 2019-12-11 MED ORDER — CEPHALEXIN 500 MG PO CAPS: 500.0000 mg | ORAL_CAPSULE | Freq: Three times a day (TID) | ORAL | 0 refills | Status: DC

## 2019-12-11 MED ORDER — OXYCODONE HCL 10 MG PO TABS: 10.0000 mg | ORAL_TABLET | Freq: Four times a day (QID) | ORAL | 0 refills | Status: DC | PRN

## 2019-12-11 MED ORDER — ENOXAPARIN SODIUM 120 MG/0.8ML ~~LOC~~ SOLN: 120.0000 mg | SUBCUTANEOUS | 0 refills | Status: DC

## 2019-12-11 NOTE — Patient Instructions (Signed)
#  In preparation for upcoming surgery on March 10; last dose of Xarelto on March 6th.   #Start taking Lovenox once a day [in the morning]-starting March 7th; eighth and ninth.  Do not take it on the 10th/day of surgery.   #Lovenox could be resumed after surgery/at the discretion of surgeon.  Please call us after surgery-we will coordinate transition back to Xarelto.  ---------------------------------------------------------------------------  # ENDOCRINOLOGY CLEARANCE-

## 2019-12-11 NOTE — Assessment & Plan Note (Addendum)
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 CT chest- dec 21nd- Improved lung nodues.  Clinically stable.  #We will plan to hold Alimta chemotherapy-given upcoming knee surgery as planned.  Patient understands that patient's chemotherapy has been interrupted to facilitate surgery under extenuating situation [see below].  Ideally I would have wanted to continue chemotherapy indefinitely without interruptions.  However given significant quality of life issues-I think is reasonable to proceed with orthopedic surgery.  # new cough- ? Etiology check CXR; hx of recent covid; and lung mets. Continue inhalers.   #Bilateral shoulder arthritis/-left more than right; right knee severe arthritis-after careful consideration with Dr. Jacqualin Combes proceeding with right knee replacement.  Will want to hold off left shoulder replacement given the significant morbidity/recuperation post surgery.   # Sore throat-? Thrush- recommend addition of diflucan 100 mg/day x 7 days; continue nystatin prn; magic mouth wash.     # Adrenal insufficiency-on hydrocortisone 20 mg/day; hydrocortisone in PM.  Clinically stable; patient will need stress dose of steroids perioperatively.  #Peripheral neuropathy-stable.  Continue gabapentin.  # Hypomagnesemia-secondary platinum chemotherapy-mag 1.6- stable.  #Chronic right lower quadrant abdominal pain/scar tissue versus others-poorly controlled- on oxycodone; poorly controlled given the worsening arthritic pain control. Discussed re: need to keeping narcotics scripts from one prescriber.  Urine drug screen pending.  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  Patient had to be bridged with Lovenox.  Instructions given regarding bridging with Lovenox.  Lovenox refills given.   #Adrenal insufficiency-currently stable on hydrocortisone.  Patient will likely need stress dose of steroids on the time of  surgery.  I again reminded the patient that she needs to follow-up with [facility] endocrinology regarding recommendations.  # DISPOSITION:  # CXR today.  # HOLD Mag/IV infusions today.  # Follow up in 4 weeks-MD; labs-cbc/cmp/mag; ; MD; no chemo; possible mag IV 2 hour. Dr.B -------------------------------------------------------------------------------------   #In preparation for upcoming surgery on March 10; last dose of Xarelto on March 6th.  #Start taking Lovenox once a day [in the morning]-starting March 7th; eighth and ninth.  Do not take it on the 10th/day of surgery.  #Lovenox could be resumed after surgery/at the discretion of surgeon.  Please call us after surgery-we will coordinate transition back to Xarelto.  ----------------------------------------------------------------  Addendum: Reviewed the results chest x-ray-left lower lobe atelectasis versus early developing infiltrate.  Given patient's multiple comorbidities/risk of severe infections would recommend-Keflex.  Prescription sent.  Discussed the results of the chest x-ray with the patient.  # 40 minutes face-to-face with the patient discussing the above plan of care; more than 50% of time spent on prognosis/ natural history; counseling and coordination.

## 2019-12-11 NOTE — Progress Notes (Signed)
[facility] NOTE  Patient Care Team: Towanda Malkin, MD as PCP - General (Internal Medicine) Mellody Drown, MD as Consulting Physician (Obstetrics and Gynecology) [name], MD as Consulting Physician (Gastroenterology) Michael Boston, MD as Consulting Physician (General Surgery) Lovell Sheehan, MD as Consulting Physician (Orthopedic Surgery) Cammie Sickle, MD as Consulting Physician (Oncology) Earnestine Leys, MD as Consulting Physician (Orthopedic Surgery)  CHIEF COMPLAINTS/PURPOSE OF CONSULTATION: Cervical cancer  #  Oncology History Overview Note  # dec 2017- CERVICAL ADENO CA [[place]]; Stage IB [Dr. Christene Slates at [facility] [place], [place];No surgery- Chemo-RT;   # 2018-sep - lung nodules [in ]  # AUG 20th, 2019-#1 Carbo-Taxol s/p 6 cycles- [No avastin sec to blood clots]: Feb 2020-bilateral lung nodules with 1 cm in size. March 2020- finished carbo-taxol #7; poor tolerance to [place].  # April 15 th 2020- Start Taxol weekly x 3; one week OFF;  # July 2020-CT- bil cavitary lesions- slightly bigger by few mm/overall STABLE; CT a/p-NED. 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  She received treatment from 11/2016-05/2017.  01/2017 cisplatin x 2 and carboplatin x 1 (01/29/2017) due to ARF and XRT.  XRT was followed by T & O on 02/01/2017 and T & N 02/10/2017 and 02/20/2017.  Course was  complicated by 80 pound weight loss, nausea, vomiting, electrolyte wasting (potassium and magnesium).  She describes that.  Is been sick constantly requiring at least 20 hospitalizations.  Follow-up CT chest and PET on 06/2017. Per patient, 'radiation worked' and no disease in the abdomen.  At that time she was noted to have lung nodules that were growing and follow-up imaging was scheduled for 10/2017.  She was admitted to hospital in [place] for small bowel obstruction which was managed conservatively and she was home for a week prior to traveling to [place] for Thanksgiving holiday where she has family.  She presented to ER in [place] on 08/2017 with nausea, vomiting, and lower abdominal pain.  Symptoms did not respond to conservative treatment.   CT on 08/26/2017 revealed small bowel obstruction with transition in the pelvis just superior to the uterus rather was a long segment of distal ileum with fatty wall thickening compatible with chronic inflammation and/or radiation enteritis. Imaging showed numerous pulmonary nodules consistent with metastatic disease. She underwent laparotomy and right ileocolectomy on 08/31/2017 at [facility].  Surgical findings revealed a thickened, matted, and scarred piece of distal small bowel close to the ileocecal valve.  She was discharged on 09/05/2017.  Pain markedly increased in intensity and imaging was performed on 09/11/2017 which revealed: Debris within the anterior abdominal wall incision concerning for infection versus packing material, s/p post ileo-colectomy with expected postoperative changes, mild colonic ileus, numerous pulmonary nodules highly concerning for metastatic disease, punctate nonobstructing nephrolithiasis.  Staples were removed and one was packed.  She was started on doxycycline.  Abdominal and pelvic CT without contrast on 09/11/2017 revealed debris within anterior abdominal wall incision concerning  for infection, versus packing  material.   She is s/p ileocolectomy with expected postoperative changes and mild colonic ileus.  There were numerous pulmonary nodules highly concerning for metastatic disease and punctate nonobstructing nephrolithiasis. She was readmitted on 09/12/2017.  She describes the onset of lower abdominal pain on 09/09/2017.  Pain markedly increased in intensity on 09/11/2017.   Staples were removed and the wound packed.  She was started on doxycycline.  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   PET scan was ordered to evaluate enlarging lung nodules with concern for recurrent cervical cancer but scan was delayed due to insurance and need to be performed in [place].  Presented to ER on 12/03/2017 for abdominal pain and emesis.  Imaging concerning for worsening possible uterine infarct and she was admitted to hospital.  Pelvic MRI was unremarkable.  Remote scarring type changes of uterus thought to be possibly related to radiation.  She was discharged on 12/08/2017.  Underwent endoscopy and colonoscopy on 12/20/2017.    On 02/22/2018 she underwent laser ablation of condylomata around the anus and vagina under anesthesia with Dr. Johney Maine.   04/15/2018:  Chest, abdomen, and pelvis CT revealed innumerable (> 100) cavitary nodules scattered in the lungs, moderately enlarging compared to the 11/08/2017 PET-CT, suspicious for metastatic disease.  One index node in the RLL measures 1.0 x 1.1 cm (previously 0.6 x 0.6 cm).  There were no new nodules.  There was an ill-defined wall thickening in the rectosigmoid with surrounding stranding  along fascia planes, probably sequela from prior radiation therapy.  There was multilevel lumbar impingement due to spondylosis and degenerative disc disease.  There was heterogeneous enhancement in the uterus (some possibly from prior radiation therapy).   04/23/2018:  PET scan revealed numerous scattered solid and cavitary nodules in the lungs stable increased in size compared to the prior PET-CT from 11/08/2017. Largest nodule was 1.1 cm in the LUL (SUV 1.9).  These demonstrated low-grade metabolic activity up to a maximum SUV of about 2.3, increased from 11/08/2017.    Case was discussed at tumor board on 04/25/2018. Consensus to pursue CT-guided biopsy (05/06/18) which revealed: Metastatic adenocarcinoma, morphologically consistent with cervical adenocarcinoma.  She has history of chronic hepatitis C which is managed by GI.  Hepatitis C genotype is 2a/2c.  She receives B12 injections for history of B12 deficiency.  On 04/24/2018 she underwent left total knee replacement with Dr. [name]. --------------------------------------------------------   DIAGNOSIS: Cervical adenocarcinoma  STAGE: IV        ;GOALS: Palliative  CURRENT/MOST RECENT THERAPY : Alimta [C]    Malignant neoplasm of overlapping sites of cervix ([place])  01/29/2019 - 04/23/2019 Chemotherapy   The patient had PACLitaxel (TAXOL) 156 mg in sodium chloride 0.9 % 250 mL chemo infusion (</= 74m/m2), 80 mg/m2 = 156 mg, Intravenous,  Once, 3 of 4 cycles Dose modification: 65 mg/m2 (original dose 80 mg/m2, Cycle 1, Reason: Provider Judgment) Administration: 156 mg (01/29/2019), 156 mg (02/05/2019), 126 mg (02/12/2019), 126 mg (02/26/2019), 126 mg (03/13/2019), 126 mg (03/27/2019), 126 mg (03/20/2019), 126 mg (04/03/2019), 126 mg (04/10/2019)  for chemotherapy treatment.    05/08/2019 - 07/30/2019 Chemotherapy  The patient had pembrolizumab (KEYTRUDA) 200 mg in sodium chloride 0.9 % 50 mL chemo infusion, 200 mg, Intravenous, Once, 4 of 6  cycles Administration: 200 mg (05/08/2019), 200 mg (05/29/2019), 200 mg (06/19/2019), 200 mg (07/10/2019)  for chemotherapy treatment.    08/07/2019 -  Chemotherapy   The patient had pegfilgrastim-cbqv [facility]) injection 6 mg, 6 mg, Subcutaneous, Once, 3 of 5 cycles Administration: 6 mg (08/29/2019), 6 mg (09/19/2019) PEMEtrexed (ALIMTA) 1,000 mg in sodium chloride 0.9 % 100 mL chemo infusion, 950 mg, Intravenous,  Once, 4 of 6 cycles Administration: 1,000 mg (08/07/2019), 1,000 mg (09/18/2019), 1,000 mg (11/13/2019), 1,000 mg (08/28/2019)  for chemotherapy treatment.       HISTORY OF PRESENTING ILLNESS:  Joanna Hall 54 y.o.  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  Patient is awaiting right knee surgery; replacement on March 10.  In the interim patient was evaluated at the urgent care clinic for sores in the mouth-diagnosed with thrush.  Patient has Medical laboratory scientific officer.   Continues to complain of swelling of the right knee and also the left shoulder.  Patient states that she filled a prescription for hydrocodone given by the orthopedic.  Because of the intense pain in the right knee.  No nausea no vomiting.  Chronic abdominal pain.  No diarrhea.  Chronic fatigue.  Review of Systems  Constitutional: Positive for malaise/fatigue. Negative for chills, diaphoresis, fever and weight loss.  HENT: Negative for nosebleeds and sore throat.   Eyes: Negative for double vision.  Respiratory: Negative for cough, hemoptysis, sputum production, shortness of breath and wheezing.   Cardiovascular: Negative for chest pain, palpitations, orthopnea and leg swelling.  Gastrointestinal: Positive for abdominal pain and constipation. Negative for blood in stool, heartburn, melena, nausea and vomiting.  Genitourinary: Negative for dysuria, frequency and urgency.  Musculoskeletal:  Positive for back pain and joint pain.  Skin: Negative.  Negative for itching and rash.  Neurological: Positive for tingling. Negative for dizziness, focal weakness, weakness and headaches.  Endo/Heme/Allergies: Does not bruise/bleed easily.  Psychiatric/Behavioral: Negative for depression. The patient is not nervous/anxious and does not have insomnia.      MEDICAL HISTORY:  Past Medical History:  Diagnosis Date  . Abdominal pain 06/10/2018  . Abnormal cervical Papanicolaou smear 09/18/2017  . Anxiety   . Aortic atherosclerosis ([place])   . Arthritis    neck and knees  . 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 Blood transfusion without reported diagnosis   . Cervical cancer ([place]) 09/2016   mets lung  . Chronic anal fissure   . Chronic diarrhea   . Dyspnea   . Erosive gastropathy 09/18/2017  . Factor V Leiden mutation ([place])   . Fecal incontinence   . Genital warts   . GERD (gastroesophageal reflux disease)   . GI bleed 10/08/2018  . Heart murmur   . Hematochezia   . Hemorrhoids   . 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 Hepatitis, chronic ([place]) 05/05/2017  . History of cancer chemotherapy    completed 06/2017  . History of Clostridium difficile infection    while undergoing chemo.  Negative test 10/2017  . Ileocolic anastomotic leak   . 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 Intestinal infection due to Clostridium difficile 09/18/2017  .  Macrocytic anemia with vitamin B12 deficiency   . Multiple gastric ulcers   . Nausea vomiting and diarrhea   . Pancolitis ([place]) 07/27/2018  . Perianal condylomata   . Pneumonia    History of  . Pulmonary nodules   . Rectal bleeding   . Small bowel obstruction ([place]) 08/2017  . Stiff neck    limited right turn  . Vitamin D deficiency     SURGICAL HISTORY: Past Surgical History:  Procedure Laterality Date  . CHOLECYSTECTOMY    . COLON SURGERY  08/2017   resection  .  COLONOSCOPY WITH PROPOFOL N/A 12/20/2017   Procedure: COLONOSCOPY WITH PROPOFOL;  Surgeon: [name], MD;  Location[facility]COPY;  Service: Gastroenterology;  Laterality: N/A;  . COLONOSCOPY WITH PROPOFOL N/A 07/30/2018   Procedure: COLONOSCOPY WITH PROPOFOL;  Surgeon: [name], MD;  Location: [facility] [place] ENDOSCOPY;  Service: Gastroenterology;  Laterality: N/A;  . COLONOSCOPY WITH PROPOFOL N/A 10/10/2018   Procedure: COLONOSCOPY WITH PROPOFOL;  Surgeon: Lucilla Lame, MD;  Location: [facility] ENDOSCOPY;  Service: Endoscopy;  Laterality: N/A;  . DIAGNOSTIC LAPAROSCOPY    . ESOPHAGOGASTRODUODENOSCOPY (EGD) WITH PROPOFOL N/A 12/20/2017   Procedure: ESOPHAGOGASTRODUODENOSCOPY (EGD) WITH PROPOFOL;  Surgeon: [name], MD;  Location: [place];  Service: Gastroenterology;  Laterality: N/A;  . ESOPHAGOGASTRODUODENOSCOPY (EGD) WITH PROPOFOL  07/30/2018   Procedure: ESOPHAGOGASTRODUODENOSCOPY (EGD) WITH PROPOFOL;  Surgeon: [name], MD;  Location: [facility] ENDOSCOPY;  Service: Gastroenterology;;  . Otho Darner SIGMOIDOSCOPY N/A 11/21/2018   Procedure: FLEXIBLE SIGMOIDOSCOPY;  Surgeon: [name], MD;  Location: [facility] ENDOSCOPY;  Service: Gastroenterology;  Laterality: N/A;  . LAPAROTOMY N/A 08/31/2017   Procedure: EXPLORATORY LAPAROTOMY for SBO, ileocolectomy, removal of piece of uterine wall;  Surgeon: [name], MD;  Location: [facility] ORS;  Service: General;  Laterality: N/A;  . LASER ABLATION CONDOLAMATA N/A 02/22/2018   Procedure: LASER ABLATION/REMOVAL OF HALPFXTKWIO AROUND ANUS AND VAGINA;  Surgeon: Michael Boston, MD;  Location: [place];  Service: General;  Laterality: N/A;  . OOPHORECTOMY    . PORTA CATH INSERTION N/A 05/13/2018   Procedure: PORTA CATH INSERTION;  Surgeon: Katha Cabal, MD;  Location: [place] CV LAB;  Service: Cardiovascular;  Laterality: N/A;  . SMALL INTESTINE SURGERY    . TANDEM RING INSERTION     x3  . THORACOTOMY     . TOTAL KNEE ARTHROPLASTY Left 04/24/2018   Procedure: TOTAL KNEE ARTHROPLASTY;  Surgeon: Lovell Sheehan, MD;  Location: [facility] ORS;  Service: Orthopedics;  Laterality: Left;    SOCIAL HISTORY: Social History   Socioeconomic History  . Marital status: Divorced    Spouse name: Not on file  . Number of children: Not on file  . Years of education: Not on file  . Highest education level: Not on file  Occupational History  . Not on file  Tobacco Use  . Smoking status: Former Smoker    Packs/day: 0.25    Years: 10.00    Pack years: 2.50    Types: Cigarettes    Quit date: 10/16/2006    Years since quitting: 13.1  . Smokeless tobacco: Never Used  Substance and Sexual Activity  . Alcohol use: Not Currently    Comment: seldom  . Drug use: Yes    Types: Marijuana    Comment: not very often  . Sexual activity: Not Currently    Birth control/protection: Post-menopausal    Comment: Not Asked  Other Topics Concern  . Not on file  Social  History Narrative  . Not on file   Social Determinants of Health   Financial Resource Strain:   . Difficulty of Paying Living Expenses: Not on file  Food Insecurity:   . Worried About Charity fundraiser in the Last Year: Not on file  . Ran Out of Food in the Last Year: Not on file  Transportation Needs:   . Lack of Transportation (Medical): Not on file  . Lack of Transportation (Non-Medical): Not on file  Physical Activity:   . Days of Exercise per Week: Not on file  . Minutes of Exercise per Session: Not on file  Stress:   . Feeling of Stress : Not on file  Social Connections:   . Frequency of Communication with Friends and Family: Not on file  . Frequency of Social Gatherings with Friends and Family: Not on file  . Attends Religious Services: Not on file  . Active Member of Clubs or Organizations: Not on file  . Attends Archivist Meetings: Not on file  . Marital Status: Not on file  Intimate Partner Violence:   . Fear of  Current or Ex-Partner: Not on file  . Emotionally Abused: Not on file  . Physically Abused: Not on file  . Sexually Abused: Not on file    FAMILY HISTORY: Family History  Problem Relation Age of Onset  . Hypertension Mother   . Hypertension Father   . Diabetes Father   . Hyperlipidemia Father   . Alcohol abuse Daughter   . Hypertension Maternal Grandmother   . Diabetes Maternal Grandmother   . Diabetes Paternal Grandmother   . Hypertension Paternal Grandmother     ALLERGIES:  is allergic to ketamine.  MEDICATIONS:  Current Outpatient Medications  Medication Sig Dispense Refill  . amitriptyline (ELAVIL) 100 MG tablet Take 1 tablet (100 mg total) by mouth at bedtime. 90 tablet 1  . amLODipine (NORVASC) 10 MG tablet Take by mouth.    . benzonatate (TESSALON) 200 MG capsule Take 1 capsule by mouth 3 (three) times daily.    . Calcium Carb-Cholecalciferol (CALCIUM 500 +D) 500-400 MG-UNIT TABS Take 2 tablets by mouth daily.    . colchicine 0.6 MG tablet Take by mouth.    . cyanocobalamin (,VITAMIN B-12,) 1000 MCG/ML injection Inject into the muscle.    . famotidine (PEPCID) 20 MG tablet Take 1 tablet (20 mg total) by mouth 2 (two) times daily. 60 tablet 0  . folic acid (FOLVITE) 1 MG tablet Take 1 tablet (1 mg total) by mouth daily. 30 tablet 6  . HYDROcodone-acetaminophen (NORCO/VICODIN) 5-325 MG tablet Take 1 tablet by mouth every 4 (four) hours as needed for pain.    . hydrocortisone (CORTEF) 10 MG tablet Take 1 tablet (10 mg total) by mouth 2 (two) times daily. 180 tablet 0  . hydrOXYzine (VISTARIL) 25 MG capsule Take 1 capsule (25 mg total) by mouth 3 (three) times daily as needed for itching. 30 capsule 0  . hyoscyamine (LEVBID) 0.375 MG 12 hr tablet Take 1 tablet (0.375 mg total) by mouth 2 (two) times daily as needed for cramping.    . meloxicam (MOBIC) 15 MG tablet meloxicam 15 mg tablet  Take 1 tablet every day by oral route.    . Multiple Vitamins-Minerals (SUPER THERA VITE  M) TABS Take by mouth.    . nystatin (MYCOSTATIN) 100000 UNIT/ML suspension Take 5 mLs by mouth in the morning, at noon, in the evening, and at bedtime.    Marland Kitchen  ondansetron (ZOFRAN ODT) 4 MG disintegrating tablet Take 1 tablet (4 mg total) by mouth every 8 (eight) hours as needed for nausea or vomiting. 20 tablet 0  . oxyCODONE (OXYCONTIN) 20 mg 12 hr tablet Take 1 tablet (20 mg total) by mouth every 8 (eight) hours. 90 tablet 0  . pantoprazole (PROTONIX) 40 MG tablet Take 1 tablet (40 mg total) by mouth daily. 90 tablet 3  . potassium chloride SA (KLOR-CON) 20 MEQ tablet Take 1 tablet (20 mEq total) by mouth 2 (two) times daily. 30 tablet 0  . promethazine (PHENERGAN) 25 MG tablet Take 1 tablet (25 mg total) by mouth every 8 (eight) hours as needed for nausea or vomiting. 30 tablet 6  . rivaroxaban (XARELTO) 20 MG TABS tablet Take 1 tablet (20 mg total) by mouth every morning. Takes in the morning    . sertraline (ZOLOFT) 25 MG tablet Take 1 tablet (25 mg total) by mouth daily. 30 tablet 0  . traMADol (ULTRAM) 50 MG tablet Take 50 mg by mouth every 6 (six) hours as needed.    . triamcinolone ointment (KENALOG) 0.5 % Apply 1 application topically 2 (two) times daily. 30 g 0  . VENTOLIN HFA 108 (90 Base) MCG/ACT inhaler Inhale 1-2 puffs into the lungs every 4 (four) hours as needed for shortness of breath. 1 Inhaler 1  . cephALEXin (KEFLEX) 500 MG capsule Take 1 capsule (500 mg total) by mouth 3 (three) times daily. 30 capsule 0  . dexamethasone (DECADRON) 4 MG tablet Take as directed. 1 pill q am and q pm x 3 days; start the day prior to chemo    . diphenoxylate-atropine (LOMOTIL) 2.5-0.025 MG tablet Take 2 tablets by mouth 2 (two) times daily as needed for diarrhea or loose stools. (Patient not taking: Reported on 12/10/2019)    . enoxaparin (LOVENOX) 120 MG/0.8ML injection Inject 0.8 mLs (120 mg total) into the skin daily. 8 mL 0  . fluconazole (DIFLUCAN) 100 MG tablet Take 1 tablet (100 mg total) by  mouth daily. 7 tablet 0  . Oxycodone HCl 10 MG TABS Take 1-2 tablets (10-20 mg total) by mouth every 6 (six) hours as needed (for breakthrough pain). 120 tablet 0  . predniSONE (STERAPRED UNI-PAK 21 TAB) 10 MG (21) TBPK tablet Take as instructed by pharmacy. (Patient not taking: Reported on 12/10/2019) 21 tablet 0   No current facility-administered medications for this visit.   Facility-Administered Medications Ordered in Other Visits  Medication Dose Route Frequency Provider Last Rate Last Admin  . heparin lock flush 100 unit/mL  500 Units Intravenous Once Corcoran, Melissa C, MD      . sodium chloride 0.9 % 1,000 mL with potassium chloride 20 mEq infusion   Intravenous Once Honor Loh E, NP      . sodium chloride flush (NS) 0.9 % injection 10 mL  10 mL Intravenous Once Borders, Kirt Boys, NP          .  PHYSICAL EXAMINATION: ECOG PERFORMANCE STATUS: 1 - Symptomatic but completely ambulatory  Vitals:   12/11/19 0908  BP: (!) 143/95  Pulse: (!) 105  Temp: 97.7 F (36.5 C)   Filed Weights   12/11/19 0908  Weight: 173 lb (78.5 kg)    Physical Exam  Constitutional: She is oriented to person, place, and time and well-developed, well-nourished, and in no distress.  Alone.  Walking herself.   HENT:  Head: Normocephalic and atraumatic.  Mouth/Throat: Oropharynx is clear and moist. No oropharyngeal exudate.  Oral thrush noted.  Eyes: Pupils are equal, round, and reactive to light.  Cardiovascular: Normal rate and regular rhythm.  Pulmonary/Chest: Effort normal and breath sounds normal. No respiratory distress. She has no wheezes.  Abdominal: Soft. Bowel sounds are normal. She exhibits no distension and no mass. There is no rebound and no guarding.  Musculoskeletal:        General: No tenderness or edema. Normal range of motion.     Cervical back: Normal range of motion and neck supple.  Neurological: She is alert and oriented to person, place, and time.  Skin: Skin is warm.   Psychiatric: Affect normal.   LABORATORY DATA:  I have reviewed the data as listed Lab Results  Component Value Date   WBC 3.8 (L) 12/11/2019   HGB 12.5 12/11/2019   HCT 39.4 12/11/2019   MCV 104.2 (H) 12/11/2019   PLT 179 12/11/2019   Recent Labs    09/25/19 1338 09/26/19 0556 11/13/19 0852 11/20/19 0806 12/11/19 0855  NA  --    < > 134* 130* 137  K  --    < > 4.3 3.1* 4.3  CL  --    < > 103 96* 103  CO2  --    < > 23 24 27   GLUCOSE  --    < > 133* 165* 88  BUN  --    < > 12 13 12   CREATININE  --    < > 0.79 0.70 0.86  CALCIUM  --    < > 8.9 8.7* 8.9  GFRNONAA  --    < > >60 >60 >60  GFRAA  --    < > >60 >60 >60  PROT 7.4   < > 7.4 7.0 7.5  ALBUMIN 3.5   < > 3.9 3.4* 3.7  AST 83*   < > 40 28 39  ALT 134*   < > 75* 45* 40  ALKPHOS 180*   < > 197* 162* 171*  BILITOT 0.6   < > 0.6 0.9 0.5  BILIDIR 0.2  --   --   --   --   IBILI 0.4  --   --   --   --    < > = values in this interval not displayed.    RADIOGRAPHIC STUDIES: I have personally reviewed the radiological images as listed and agreed with the findings in the report. DG Chest 2 View  Result Date: 12/11/2019 CLINICAL DATA:  Cough.  Cervical carcinoma EXAM: CHEST - 2 VIEW COMPARISON:  October 18, 2019 chest radiograph and chest CT October 06, 2019 FINDINGS: There is atelectatic change in the left base. There are scattered subcentimeter nodular lesions consistent with metastatic disease. Heart size and pulmonary vascular normal. No adenopathy. Port-A-Cath tip is in the superior vena cava. No pneumothorax. No bone lesions IMPRESSION: Atelectatic change lateral left base with questionable earliest changes of pneumonia. Multiple subcentimeter nodular lesions consistent with metastatic foci. No adenopathy. Heart size normal. Port-A-Cath tip in superior vena cava. Electronically Signed   By: Lowella Grip III M.D.   On: 12/11/2019 10:41    ASSESSMENT & PLAN:   Malignant neoplasm of overlapping sites of cervix  [facility]) #Cervical adenocarcinoma with metastasis to the lung;    Currently on Alimta-status post 3 cycles. CT chest- dec 21nd- Improved lung nodues.  Clinically stable.  #We will plan to hold Alimta chemotherapy-given upcoming knee surgery as planned.  Patient understands that patient's chemotherapy has been interrupted to facilitate  surgery under extenuating situation [see below].  Ideally I would have wanted to continue chemotherapy indefinitely without interruptions.  However given significant quality of life issues-I think is reasonable to proceed with orthopedic surgery.  # new cough- ? Etiology check CXR; hx of recent covid; and lung mets. Continue inhalers.   #Bilateral shoulder arthritis/-left more than right; right knee severe arthritis-after careful consideration with Dr. Jacqualin Combes proceeding with right knee replacement.  Will want to hold off left shoulder replacement given the significant morbidity/recuperation post surgery.   # Sore throat-? Thrush- recommend addition of diflucan 100 mg/day x 7 days; continue nystatin prn; magic mouth wash.     # Adrenal insufficiency-on hydrocortisone 20 mg/day; hydrocortisone in PM.  Clinically stable; patient will need stress dose of steroids perioperatively.  #Peripheral neuropathy-stable.  Continue gabapentin.  # Hypomagnesemia-secondary platinum chemotherapy-mag 1.6- stable.  #Chronic right lower quadrant abdominal pain/scar tissue versus others-poorly controlled- on oxycodone; poorly controlled given the worsening arthritic pain control. Discussed re: need to keeping narcotics scripts from one prescriber.  Urine drug screen pending.  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  Patient had to be bridged with Lovenox.  Instructions given regarding bridging with Lovenox.  Lovenox refills given.   #Adrenal insufficiency-currently stable on hydrocortisone.  Patient will likely need stress dose of steroids on the  time of surgery.  I again reminded the patient that she needs to follow-up with [facility] endocrinology regarding recommendations.  # DISPOSITION:  # CXR today.  # HOLD Mag/IV infusions today.  # Follow up in 4 weeks-MD; labs-cbc/cmp/mag; ; MD; no chemo; possible mag IV 2 hour. Dr.B -------------------------------------------------------------------------------------   #In preparation for upcoming surgery on March 10; last dose of Xarelto on March 6th.  #Start taking Lovenox once a day [in the morning]-starting March 7th; eighth and ninth.  Do not take it on the 10th/day of surgery.  #Lovenox could be resumed after surgery/at the discretion of surgeon.  Please call us after surgery-we will coordinate transition back to Xarelto.  ----------------------------------------------------------------  Addendum: Reviewed the results chest x-ray-left lower lobe atelectasis versus early developing infiltrate.  Given patient's multiple comorbidities/risk of severe infections would recommend-Keflex.  Prescription sent.  Discussed the results of the chest x-ray with the patient.    All questions were answered. The patient knows to call the clinic with any problems, questions or concerns.    Cammie Sickle, MD 12/12/2019 8:10 AM

## 2019-12-12 ENCOUNTER — Telehealth: Payer: Self-pay | Admitting: *Deleted

## 2019-12-12 ENCOUNTER — Other Ambulatory Visit: Payer: Self-pay | Admitting: Hospice and Palliative Medicine

## 2019-12-12 NOTE — Telephone Encounter (Signed)
Per [facility] Endocrinology, they have received a axz from [name] for clearance and they do not need anything further from [place]

## 2019-12-12 NOTE — Telephone Encounter (Signed)
Patient called stating she needs clearance for Endocrinology surgery sent to [facility] please

## 2019-12-13 ENCOUNTER — Other Ambulatory Visit: Payer: Self-pay | Admitting: Oncology

## 2019-12-15 ENCOUNTER — Encounter: Payer: Self-pay | Admitting: Internal Medicine

## 2019-12-15 LAB — MISC LABCORP TEST (SEND OUT): Labcorp test code: 791194

## 2019-12-16 ENCOUNTER — Telehealth: Payer: Self-pay | Admitting: *Deleted

## 2019-12-16 NOTE — Telephone Encounter (Signed)
Orthopedic surgeon requests previously faxed release for surgery form.  Benjamine Mola is the contact [phone] ext [phone]

## 2019-12-17 ENCOUNTER — Other Ambulatory Visit: Payer: Self-pay | Admitting: Gastroenterology

## 2019-12-17 ENCOUNTER — Inpatient Hospital Stay: Payer: Medicaid Other | Attending: Hospice and Palliative Medicine | Admitting: Hospice and Palliative Medicine

## 2019-12-17 ENCOUNTER — Encounter: Payer: Self-pay | Admitting: Orthopedic Surgery

## 2019-12-17 DIAGNOSIS — C538 Malignant neoplasm of overlapping sites of cervix uteri: Secondary | ICD-10-CM | POA: Diagnosis not present

## 2019-12-17 DIAGNOSIS — Z90721 Acquired absence of ovaries, unilateral: Secondary | ICD-10-CM | POA: Insufficient documentation

## 2019-12-17 DIAGNOSIS — Z9221 Personal history of antineoplastic chemotherapy: Secondary | ICD-10-CM | POA: Insufficient documentation

## 2019-12-17 DIAGNOSIS — G629 Polyneuropathy, unspecified: Secondary | ICD-10-CM | POA: Insufficient documentation

## 2019-12-17 DIAGNOSIS — Z79899 Other long term (current) drug therapy: Secondary | ICD-10-CM | POA: Insufficient documentation

## 2019-12-17 DIAGNOSIS — G893 Neoplasm related pain (acute) (chronic): Secondary | ICD-10-CM

## 2019-12-17 DIAGNOSIS — K529 Noninfective gastroenteritis and colitis, unspecified: Secondary | ICD-10-CM

## 2019-12-17 DIAGNOSIS — C7802 Secondary malignant neoplasm of left lung: Secondary | ICD-10-CM | POA: Insufficient documentation

## 2019-12-17 DIAGNOSIS — Z8616 Personal history of COVID-19: Secondary | ICD-10-CM | POA: Insufficient documentation

## 2019-12-17 DIAGNOSIS — Z86718 Personal history of other venous thrombosis and embolism: Secondary | ICD-10-CM | POA: Insufficient documentation

## 2019-12-17 DIAGNOSIS — E876 Hypokalemia: Secondary | ICD-10-CM | POA: Insufficient documentation

## 2019-12-17 DIAGNOSIS — Z515 Encounter for palliative care: Secondary | ICD-10-CM | POA: Diagnosis not present

## 2019-12-17 DIAGNOSIS — E2740 Unspecified adrenocortical insufficiency: Secondary | ICD-10-CM | POA: Insufficient documentation

## 2019-12-17 DIAGNOSIS — C7801 Secondary malignant neoplasm of right lung: Secondary | ICD-10-CM | POA: Insufficient documentation

## 2019-12-17 DIAGNOSIS — Z87891 Personal history of nicotine dependence: Secondary | ICD-10-CM | POA: Insufficient documentation

## 2019-12-17 DIAGNOSIS — C539 Malignant neoplasm of cervix uteri, unspecified: Secondary | ICD-10-CM | POA: Insufficient documentation

## 2019-12-17 DIAGNOSIS — Z7901 Long term (current) use of anticoagulants: Secondary | ICD-10-CM | POA: Insufficient documentation

## 2019-12-17 NOTE — Telephone Encounter (Signed)
Last office visit 05/06/2019 Chronic diarrhea  Last refill 09/29/2019

## 2019-12-17 NOTE — Telephone Encounter (Signed)
Signed and faxed

## 2019-12-17 NOTE — Progress Notes (Signed)
Virtual Visit via Telephone Note  I connected with Joanna Hall on 12/17/19 at  1:00 PM EST by telephone and verified that I am speaking with the correct person using two identifiers.   I discussed the limitations, risks, security and privacy concerns of performing an evaluation and management service by telephone and the availability of in person appointments. I also discussed with the patient that there may be a patient responsible charge related to this service. The patient expressed understanding and agreed to proceed.   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 Patient has multiple recent hospitalizations and ER visits for nausea and abdominal pain.    Observations/Objective: Called and spoke with patient by phone.  She says that she had been symptomatically burdened with thrush and pneumonia but is now feeling better.  She says her symptoms including pain are well controlled.  She is eating more and gaining weight.  Patient says that she is pending knee replacement next week and then treatment will have to be held for up to 6 weeks depending upon her progress.  We discussed her plan for home resources.  Patient does not want to be sent to rehab and plans to receive home health.  UDS reviewed.  Discussed with Dr. Rogue Bussing.  Patient knows that in the future she will only receive pain medications through our clinic.  Assessment and Plan: Stage IV cervical cancer -on Alimta but treatment currently held while patient pursues knee replacement  Neoplasm related pain -stable on oxycodone/OxyContin.  UDS checked and appropriately positive.  Follow Up Instructions: Follow-up telephone visit in 1 month   I  discussed the assessment and treatment plan with the patient. The patient was provided an opportunity to ask questions and all were answered. The patient agreed with the plan and demonstrated an understanding of the instructions.   The patient was advised to call back or seek an in-person evaluation if the symptoms worsen or if the condition fails to improve as anticipated.  I provided 6 minutes of non-face-to-face time during this encounter.   Irean Hong, NP

## 2019-12-17 NOTE — H&P (Signed)
Joanna Hall MRN:  [id number] DOB/SEX:  January 23, 1966/female  CHIEF COMPLAINT:  Painful right Knee  HISTORY: Patient is a 54 y.o. female presented with a history of pain in the right knee. Onset of symptoms was gradual starting several years ago with gradually worsening course since that time. Patient has been treated conservatively with over-the-counter NSAIDs and activity modification. Patient currently rates pain in the knee at 10 out of 10 with activity. There is pain at night.  PAST MEDICAL HISTORY: Patient Active Problem List   Diagnosis Date Noted  . Moderate episode of recurrent major depressive disorder ([place]) 11/13/2019  . Acute colitis 09/25/2019  . Anxiety and depression   . Thrombocytopenia ([place]) 08/17/2019  . Intractable lower abdominal pain   . Abdominal pain 07/23/2019  . Constipation 07/22/2019  . Malnutrition of moderate degree 06/22/2019  . Small bowel obstruction due to adhesions ([place]) 06/21/2019  . Acute gastroenteritis 06/14/2019  . Intractable nausea and vomiting 06/07/2019  . Chest pain 05/19/2019  . Intractable pain 05/19/2019  . Cancer associated pain   . Proctitis, radiation   . Palliative care encounter   . Encounter for monitoring opioid maintenance therapy 11/19/2018  . Adrenal insufficiency ([place]) 09/03/2018  . Nausea vomiting and diarrhea   . Chronic diarrhea 06/25/2018  . Chronic anticoagulation 06/25/2018  . Vomiting   . Encounter for antineoplastic chemotherapy 06/04/2018  . 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 Lung metastasis ([place]) 05/15/2018  . Closed fracture of distal end of radius 05/04/2018  . History of total knee arthroplasty 04/24/2018  . Osteoarthritis of left knee 02/28/2018  . Condyloma acuminatum of anus s/p ablation 02/22/2018 02/22/2018  . Condyloma acuminatum of vagina s/p ablation 02/22/2018 02/22/2018  . Positive ANA (antinuclear antibody) 02/04/2018  . Aortic atherosclerosis ([place]) 01/15/2018  . Elevated MCV 01/15/2018   . Anemia 01/15/2018  . Genital warts 01/15/2018  . Vitamin D deficiency 01/15/2018  . Pernicious anemia   . Impingement syndrome of shoulder region 12/19/2017  . Multiple joint pain 12/19/2017  . Osteoarthritis of right knee 12/19/2017  . B12 deficiency 12/11/2017  . Lung nodules 12/11/2017  . History of Clostridium difficile infection 10/23/2017  . Factor V Leiden ([place]) 10/19/2017  . Goals of care, counseling/discussion 10/19/2017  . Cervical cancer, FIGO stage IB1 ([place]) 09/18/2017  . Chronic hepatitis C without hepatic coma ([place]) 09/18/2017  . Cytopenia 09/18/2017  . Diarrhea 09/18/2017  . Elevated LFTs 09/18/2017  . Lower abdominal pain 09/18/2017  . 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 Malignant neoplasm of overlapping sites of cervix ([place]) 09/18/2017  . Renal insufficiency 09/18/2017  . Wound infection after surgery 09/12/2017  . Hypokalemia   . Hypomagnesemia   . Cervical arthritis 07/18/2017  . Dysuria 06/20/2017  . Metastasis from cervical cancer ([place]) 05/12/2017  . Essential hypertension 03/15/2017  . Anemia of chronic disease 03/01/2017  . Chemotherapy-induced neutropenia ([place]) 01/29/2017  . 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[place]) 09/25/2016   Past Medical History:  Diagnosis Date  . Abdominal pain 06/10/2018  . Abnormal cervical Papanicolaou smear 09/18/2017  . Anxiety   . Aortic atherosclerosis ([place])   . Arthritis    neck and knees  . 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 Blood transfusion without reported diagnosis   . Cervical cancer ([place]) 09/2016   mets lung  . Chronic anal fissure   . Chronic diarrhea   . Dyspnea   . Erosive gastropathy 09/18/2017  . Factor V Leiden mutation ([place])   .  Fecal incontinence   . Genital warts   . GERD (gastroesophageal reflux disease)   . GI bleed 10/08/2018  . Heart murmur   . Hematochezia   . Hemorrhoids   . 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 Hepatitis, chronic ([place])  05/05/2017  . History of cancer chemotherapy    completed 06/2017  . History of Clostridium difficile infection    while undergoing chemo.  Negative test 10/2017  . Ileocolic anastomotic leak   . Infarction of kidney ([place]) left kidney   and uterus  . Intestinal infection due to Clostridium difficile 09/18/2017  . Macrocytic anemia with vitamin B12 deficiency   . Multiple gastric ulcers   . Nausea vomiting and diarrhea   . Pancolitis ([place]) 07/27/2018  . Perianal condylomata   . Pneumonia    History of  . Pulmonary nodules   . Rectal bleeding   . Small bowel obstruction ([place]) 08/2017  . Stiff neck    limited right turn  . Vitamin D deficiency    Past Surgical History:  Procedure Laterality Date  . CHOLECYSTECTOMY    . COLON SURGERY  08/2017   resection  . COLONOSCOPY WITH PROPOFOL N/A 12/20/2017   Procedure: COLONOSCOPY WITH PROPOFOL;  Surgeon: [name], MD;  Location: [facility] ENDOSCOPY;  Service: Gastroenterology;  Laterality: N/A;  . COLONOSCOPY WITH PROPOFOL N/A 07/30/2018   Procedure: COLONOSCOPY WITH PROPOFOL;  Surgeon: [name], MD;  Location: [facility] ENDOSCOPY;  Service: Gastroenterology;  Laterality: N/A;  . COLONOSCOPY WITH PROPOFOL N/A 10/10/2018   Procedure: COLONOSCOPY WITH PROPOFOL;  Surgeon: Lucilla Lame, MD;  Location: [facility] ENDOSCOPY;  Service: Endoscopy;  Laterality: N/A;  . DIAGNOSTIC LAPAROSCOPY    . ESOPHAGOGASTRODUODENOSCOPY (EGD) WITH PROPOFOL N/A 12/20/2017   Procedure: ESOPHAGOGASTRODUODENOSCOPY (EGD) WITH PROPOFOL;  Surgeon: [name], MD;  Location: [place];  Service: Gastroenterology;  Laterality: N/A;  . ESOPHAGOGASTRODUODENOSCOPY (EGD) WITH PROPOFOL  07/30/2018   Procedure: ESOPHAGOGASTRODUODENOSCOPY (EGD) WITH PROPOFOL;  Surgeon: [name], MD;  Location: [facility] ENDOSCOPY;  Service: Gastroenterology;;  . Otho Darner SIGMOIDOSCOPY N/A 11/21/2018   Procedure: FLEXIBLE SIGMOIDOSCOPY;  Surgeon: [name], MD;   Location: [facility] ENDOSCOPY;  Service: Gastroenterology;  Laterality: N/A;  . LAPAROTOMY N/A 08/31/2017   Procedure: EXPLORATORY LAPAROTOMY for SBO, ileocolectomy, removal of piece of uterine wall;  Surgeon: [name], MD;  Location: [facility] ORS;  Service: General;  Laterality: N/A;  . LASER ABLATION CONDOLAMATA N/A 02/22/2018   Procedure: LASER ABLATION/REMOVAL OF TKPTWSFKCLE AROUND ANUS AND VAGINA;  Surgeon: Michael Boston, MD;  Location: [place];  Service: General;  Laterality: N/A;  . OOPHORECTOMY    . PORTA CATH INSERTION N/A 05/13/2018   Procedure: PORTA CATH INSERTION;  Surgeon: Katha Cabal, MD;  Location: [place] CV LAB;  Service: Cardiovascular;  Laterality: N/A;  . SMALL INTESTINE SURGERY    . TANDEM RING INSERTION     x3  . THORACOTOMY    . TOTAL KNEE ARTHROPLASTY Left 04/24/2018   Procedure: TOTAL KNEE ARTHROPLASTY;  Surgeon: Lovell Sheehan, MD;  Location: [facility] ORS;  Service: Orthopedics;  Laterality: Left;     MEDICATIONS:  (Not in a hospital admission)   ALLERGIES:   Allergies  Allergen Reactions  . Ketamine Anxiety and Other (See Comments)    Syncope episode/confusion     REVIEW OF SYSTEMS:  Pertinent items noted in HPI and remainder of comprehensive ROS otherwise negative.   FAMILY HISTORY:  Family History  Problem Relation Age of Onset  . Hypertension Mother   . Hypertension Father   . Diabetes Father   . Hyperlipidemia Father   . Alcohol abuse Daughter   . Hypertension Maternal Grandmother   . Diabetes Maternal Grandmother   . Diabetes Paternal Grandmother   . Hypertension Paternal Grandmother     SOCIAL HISTORY:   Social History   Tobacco Use  . Smoking status: Former Smoker    Packs/day: 0.25    Years: 10.00    Pack years: 2.50    Types: Cigarettes    Quit date: 10/16/2006    Years since quitting: 13.1  . Smokeless tobacco: Never Used  Substance Use Topics  . Alcohol use: Not Currently    Comment: seldom      EXAMINATION:  Vital signs in last 24 hours: @VSRANGES @  There were no vitals taken for this visit.  General Appearance:    Alert, cooperative, no distress, appears stated age  Head:    Normocephalic, without obvious abnormality, atraumatic  Eyes:    PERRL, conjunctiva/corneas clear, EOM's intact, fundi    benign, both eyes                        Heart:    Regular rate and rhythm, S1 and S2 normal, no murmur, rub   or gallop     Abdomen:     Soft, non-tender, bowel sounds active all four quadrants,    no masses, no organomegaly        Extremities:   Extremities normal, atraumatic, no cyanosis or edema  Pulses:   2+ and symmetric all extremities  Skin:   Skin color, texture, turgor normal, no rashes or lesions  Lymph nodes:   Cervical, supraclavicular, and axillary nodes normal  Neurologic:   CNII-XII intact, normal strength, sensation and reflexes    throughout    Musculoskeletal:  ROM 0-100, Ligaments intact,  Imaging Review Plain radiographs demonstrate severe degenerative joint disease of the right knee. The overall alignment is significant valgus. The bone quality appears to be good for age and reported activity level.  Assessment/Plan: Primary osteoarthritis, right knee   The patient history, physical examination and imaging studies are consistent with advanced degenerative joint disease of the right knee. The patient has failed conservative treatment.  The clearance notes were reviewed.  After discussion with the patient it was felt that Total Knee Replacement was indicated. The procedure,  risks, and benefits of total knee arthroplasty were presented and reviewed. The risks including but not limited to aseptic loosening, infection, blood clots, vascular injury, stiffness, patella tracking problems complications among others were discussed. The patient acknowledged the explanation, agreed to proceed with the plan.  Carlynn Spry 12/17/2019, 1:13 PM

## 2019-12-19 ENCOUNTER — Encounter
Admission: RE | Admit: 2019-12-19 | Discharge: 2019-12-19 | Disposition: A | Discharge status: Home / Self Care | Payer: Medicaid Other | Source: Ambulatory Visit | Attending: Orthopedic Surgery | Admitting: Orthopedic Surgery

## 2019-12-19 ENCOUNTER — Other Ambulatory Visit: Payer: Self-pay

## 2019-12-19 NOTE — Patient Instructions (Signed)
Your procedure is scheduled on: 12/24/19 Report to [place]. To find out your arrival time please call [phone] between 1PM - 3PM on 12/23/19.  Remember: Instructions that are not followed completely may result in serious medical risk, up to and including death, or upon the discretion of your surgeon and anesthesiologist your surgery may need to be rescheduled.     _X__ 1. Do not eat food after midnight the night before your procedure.                 No gum chewing or hard candies. You may drink clear liquids up to 2 hours                 before you are scheduled to arrive for your surgery- DO not drink clear                 liquids within 2 hours of the start of your surgery.                 Clear Liquids include:  water, apple juice without pulp, clear carbohydrate                 drink such as Clearfast or Gatorade, Black Coffee or Tea (Do not add                 anything to coffee or tea). Diabetics water only  __X__2.  On the morning of surgery brush your teeth with toothpaste and water, you                 may rinse your mouth with mouthwash if you wish.  Do not swallow any              toothpaste of mouthwash.     _X__ 3.  No Alcohol for 24 hours before or after surgery.   _X__ 4.  Do Not Smoke or use e-cigarettes For 24 Hours Prior to Your Surgery.                 Do not use any chewable tobacco products for at least 6 hours prior to                 surgery.  ____  5.  Bring all medications with you on the day of surgery if instructed.   __X__  6.  Notify your doctor if there is any change in your medical condition      (cold, fever, infections).     Do not wear jewelry, make-up, hairpins, clips or nail polish. Do not wear lotions, powders, or perfumes.  Do not shave 48 hours prior to surgery. Men may shave face and neck. Do not bring valuables to the hospital.    [facility] is not responsible for any belongings or  valuables.  Contacts, dentures/partials or body piercings may not be worn into surgery. Bring a case for your contacts, glasses or hearing aids, a denture cup will be supplied. Leave your suitcase in the car. After surgery it may be brought to your room. For patients admitted to the hospital, discharge time is determined by your treatment team.   Patients discharged the day of surgery will not be allowed to drive home.   Please read over the following fact sheets that you were given:   MRSA Information  __X__ Take these medicines the morning of surgery with A SIP OF WATER:  1. famotidine (PEPCID) 20 MG tablet  2. hyoscyamine (LEVBID) 0.375 MG 12 hr tablet  3. pantoprazole (PROTONIX) 40 MG tablet  4. sertraline (ZOLOFT) 25 MG tablet  5. May take oxycodone if needed  6.  ____ Fleet Enema (as directed)   __X__ Use CHG Soap/SAGE wipes as directed  __X__ Use inhalers on the day of surgery  ____ Stop metformin/Janumet/Farxiga 2 days prior to surgery    ____ Take 1/2 of usual insulin dose the night before surgery. No insulin the morning          of surgery.   __X__ Stop Blood Thinners Coumadin/Plavix/Xarelto/Pleta/Pradaxa/Eliquis/Effient/Aspirin  on   Or contact your Surgeon, Cardiologist or Medical Doctor regarding  ability to stop your blood thinners   (FOLLOW PREVIOUS INSTRUCTIONS BY YOUR DOCTOR)  __X__ Stop Anti-inflammatories 7 days before surgery such as Advil, Ibuprofen, Motrin,  BC or Goodies Powder, Naprosyn, Naproxen, Aleve, Aspirin    __X__ Stop all herbal supplements, fish oil or vitamin E until after surgery.    ____ Bring C-Pap to the hospital.

## 2019-12-22 ENCOUNTER — Encounter
Admission: RE | Admit: 2019-12-22 | Discharge: 2019-12-22 | Disposition: A | Discharge status: Home / Self Care | Payer: Medicaid Other | Source: Ambulatory Visit | Attending: Orthopedic Surgery | Admitting: Orthopedic Surgery

## 2019-12-22 ENCOUNTER — Encounter: Payer: Self-pay | Admitting: Nurse Practitioner

## 2019-12-22 ENCOUNTER — Inpatient Hospital Stay: Payer: Medicaid Other

## 2019-12-22 ENCOUNTER — Inpatient Hospital Stay (HOSPITAL_BASED_OUTPATIENT_CLINIC_OR_DEPARTMENT_OTHER): Payer: Medicaid Other | Admitting: Nurse Practitioner

## 2019-12-22 ENCOUNTER — Other Ambulatory Visit: Payer: Self-pay

## 2019-12-22 ENCOUNTER — Telehealth: Payer: Self-pay | Admitting: *Deleted

## 2019-12-22 ENCOUNTER — Other Ambulatory Visit: Payer: Medicaid Other

## 2019-12-22 VITALS — BP 167/102 | HR 90 | Temp 96.5°F | Resp 20

## 2019-12-22 DIAGNOSIS — A084 Viral intestinal infection, unspecified: Secondary | ICD-10-CM | POA: Diagnosis not present

## 2019-12-22 DIAGNOSIS — E860 Dehydration: Secondary | ICD-10-CM

## 2019-12-22 DIAGNOSIS — Z86718 Personal history of other venous thrombosis and embolism: Secondary | ICD-10-CM | POA: Diagnosis not present

## 2019-12-22 DIAGNOSIS — Z8616 Personal history of COVID-19: Secondary | ICD-10-CM | POA: Diagnosis not present

## 2019-12-22 DIAGNOSIS — C7801 Secondary malignant neoplasm of right lung: Secondary | ICD-10-CM | POA: Diagnosis not present

## 2019-12-22 DIAGNOSIS — Z9221 Personal history of antineoplastic chemotherapy: Secondary | ICD-10-CM | POA: Diagnosis not present

## 2019-12-22 DIAGNOSIS — E876 Hypokalemia: Secondary | ICD-10-CM | POA: Diagnosis not present

## 2019-12-22 DIAGNOSIS — Z79899 Other long term (current) drug therapy: Secondary | ICD-10-CM | POA: Diagnosis not present

## 2019-12-22 DIAGNOSIS — Z87891 Personal history of nicotine dependence: Secondary | ICD-10-CM | POA: Diagnosis not present

## 2019-12-22 DIAGNOSIS — R7989 Other specified abnormal findings of blood chemistry: Secondary | ICD-10-CM

## 2019-12-22 DIAGNOSIS — G629 Polyneuropathy, unspecified: Secondary | ICD-10-CM | POA: Diagnosis not present

## 2019-12-22 DIAGNOSIS — Z7901 Long term (current) use of anticoagulants: Secondary | ICD-10-CM | POA: Diagnosis not present

## 2019-12-22 DIAGNOSIS — Z01812 Encounter for preprocedural laboratory examination: Secondary | ICD-10-CM | POA: Insufficient documentation

## 2019-12-22 DIAGNOSIS — C539 Malignant neoplasm of cervix uteri, unspecified: Secondary | ICD-10-CM | POA: Diagnosis present

## 2019-12-22 DIAGNOSIS — C7802 Secondary malignant neoplasm of left lung: Secondary | ICD-10-CM | POA: Diagnosis not present

## 2019-12-22 DIAGNOSIS — Z90721 Acquired absence of ovaries, unilateral: Secondary | ICD-10-CM | POA: Diagnosis not present

## 2019-12-22 DIAGNOSIS — E2740 Unspecified adrenocortical insufficiency: Secondary | ICD-10-CM | POA: Diagnosis not present

## 2019-12-22 DIAGNOSIS — Z95828 Presence of other vascular implants and grafts: Secondary | ICD-10-CM

## 2019-12-22 LAB — APTT: aPTT: 25 seconds (ref 24–36)

## 2019-12-22 LAB — CBC
HCT: 39.4 % (ref 36.0–46.0)
Hemoglobin: 13.6 g/dL (ref 12.0–15.0)
MCH: 33.5 pg (ref 26.0–34.0)
MCHC: 34.5 g/dL (ref 30.0–36.0)
MCV: 97 fL (ref 80.0–100.0)
Platelets: 250 10*3/uL (ref 150–400)
RBC: 4.06 MIL/uL (ref 3.87–5.11)
RDW: 12.6 % (ref 11.5–15.5)
WBC: 5.6 10*3/uL (ref 4.0–10.5)
nRBC: 0 % (ref 0.0–0.2)

## 2019-12-22 LAB — BASIC METABOLIC PANEL
Anion gap: 11 (ref 5–15)
BUN: 11 mg/dL (ref 6–20)
CO2: 23 mmol/L (ref 22–32)
Calcium: 9.5 mg/dL (ref 8.9–10.3)
Chloride: 100 mmol/L (ref 98–111)
Creatinine, Ser: 1.32 mg/dL — ABNORMAL HIGH (ref 0.44–1.00)
GFR calc Af Amer: 54 mL/min — ABNORMAL LOW (ref >60)
GFR calc non Af Amer: 46 mL/min — ABNORMAL LOW (ref >60)
Glucose, Bld: 101 mg/dL — ABNORMAL HIGH (ref 70–99)
Potassium: 3.4 mmol/L — ABNORMAL LOW (ref 3.5–5.1)
Sodium: 134 mmol/L — ABNORMAL LOW (ref 135–145)

## 2019-12-22 LAB — MAGNESIUM: Magnesium: 1.5 mg/dL — ABNORMAL LOW (ref 1.7–2.4)

## 2019-12-22 LAB — URINALYSIS, ROUTINE W REFLEX MICROSCOPIC
Bacteria, UA: NONE SEEN
Glucose, UA: NEGATIVE mg/dL
Hgb urine dipstick: NEGATIVE
Ketones, ur: NEGATIVE mg/dL
Nitrite: NEGATIVE
Protein, ur: 30 mg/dL — AB
Specific Gravity, Urine: 1.023 (ref 1.005–1.030)
Squamous Epithelial / HPF: NONE SEEN (ref 0–5)
pH: 5 (ref 5.0–8.0)

## 2019-12-22 LAB — PROTIME-INR
INR: 1 (ref 0.8–1.2)
Prothrombin Time: 12.8 seconds (ref 11.4–15.2)

## 2019-12-22 MED ORDER — SODIUM CHLORIDE 0.9 % IV SOLN
Freq: Once | INTRAVENOUS | Status: AC
  Filled 2019-12-22: qty 250

## 2019-12-22 MED ORDER — HEPARIN SOD (PORK) LOCK FLUSH 100 UNIT/ML IV SOLN
500.0000 [IU] | Freq: Once | INTRAVENOUS | Status: AC
  Administered 2019-12-22: 500 [IU] via INTRAVENOUS
  Filled 2019-12-22: qty 5

## 2019-12-22 MED ORDER — SODIUM CHLORIDE 0.9% FLUSH
10.0000 mL | INTRAVENOUS | Status: DC | PRN
  Administered 2019-12-22: 10 mL via INTRAVENOUS
  Filled 2019-12-22: qty 10

## 2019-12-22 NOTE — Progress Notes (Signed)
Symptom Management [place]  [phone] Fax:([phone]) [phone]  Patient Care Team: Towanda Malkin, MD as PCP - General (Internal Medicine) Mellody Drown, MD as Consulting Physician (Obstetrics and Gynecology) [name], MD as Consulting Physician (Gastroenterology) Michael Boston, MD as Consulting Physician (General Surgery) Lovell Sheehan, MD as Consulting Physician (Orthopedic Surgery) Cammie Sickle, MD as Consulting Physician (Oncology) Earnestine Leys, MD as Consulting Physician (Orthopedic Surgery)   Name of the patient: Joanna Hall  [id number]  06/05/1966   Date of visit: 12/22/19  Diagnosis- metastatic cervical cancer  Chief complaint/ Reason for visit- vomiting & dehydration  Heme/Onc history:  Oncology History Overview Note  # dec 2017- CERVICAL ADENO CA [[place]]; Stage IB [Dr. Christene Slates at [facility] in [place], [place];No surgery- Chemo-RT;   # 2018-sep - lung nodules [in [place]]  # AUG 20th, 2019-#1 Carbo-Taxol s/p 6 cycles- [No avastin sec to blood clots]: Feb 2020-bilateral lung nodules with 1 cm in size. March 2020- finished carbo-taxol #7; poor tolerance to [place].  # April 15 th 2020- Start Taxol weekly x 3; one week OFF;  # July 2020-CT- bil cavitary lesions- slightly bigger by few mm/overall STABLE; CT a/p-NED. 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  She received treatment from 11/2016-05/2017.  01/2017 cisplatin x 2 and carboplatin x 1 (01/29/2017) due to ARF and XRT.  XRT was followed by T & O on 02/01/2017 and T & N 02/10/2017 and 02/20/2017.  Course was complicated by 80 pound weight loss, nausea, vomiting, electrolyte wasting (potassium and magnesium).  She describes that.  Is been sick constantly requiring at least 20 hospitalizations.  Follow-up CT chest and PET on 06/2017. Per patient, 'radiation worked' and no disease in the abdomen.  At that time she was noted to have lung nodules that were growing and follow-up imaging was scheduled for 10/2017.  She was admitted to hospital in [place] for small bowel obstruction which was managed conservatively and she was home for a week prior to traveling to [place] for Thanksgiving holiday where she has family.  She presented to ER in [place] on 08/2017 with nausea, vomiting, and lower abdominal pain.  Symptoms did not respond to conservative treatment.   CT on 08/26/2017 revealed small bowel obstruction with transition in the pelvis just superior to the uterus rather was a long segment of distal ileum with fatty wall thickening compatible with chronic inflammation and/or radiation enteritis. Imaging showed numerous pulmonary nodules consistent with metastatic disease. She underwent laparotomy and right ileocolectomy on 08/31/2017 at [facility].  Surgical findings revealed a thickened, matted, and scarred piece of distal small bowel close to the ileocecal valve.  She was discharged on 09/05/2017.  Pain markedly increased in intensity and imaging was performed on 09/11/2017 which revealed: Debris within the anterior abdominal wall incision concerning for infection versus packing material, s/p post ileo-colectomy with expected postoperative changes, mild colonic ileus, numerous pulmonary nodules highly concerning for metastatic disease, punctate  nonobstructing nephrolithiasis.   Staples were removed and one was packed.  She was started on doxycycline.  Abdominal and pelvic CT without contrast on 09/11/2017 revealed debris within anterior abdominal wall incision concerning for infection, versus packing material.   She is s/p ileocolectomy with expected postoperative changes and mild colonic ileus.  There were numerous pulmonary nodules highly concerning for metastatic disease and punctate nonobstructing nephrolithiasis. She was readmitted on 09/12/2017.  She describes the onset of lower abdominal pain on 09/09/2017.  Pain markedly increased in intensity on 09/11/2017.   Staples were removed and the wound packed.  She was started on doxycycline.  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   PET scan was ordered to evaluate enlarging lung nodules with concern for recurrent cervical cancer but scan was delayed due to insurance and need to be performed in [place].  Presented to ER on 12/03/2017 for abdominal pain and emesis.  Imaging concerning for worsening possible uterine infarct and she was admitted to hospital.  Pelvic MRI was unremarkable.  Remote scarring type changes of uterus thought to be possibly related to radiation.  She was discharged on 12/08/2017.  Underwent endoscopy and colonoscopy on 12/20/2017.    On 02/22/2018 she underwent laser ablation of condylomata around the anus and vagina under anesthesia with Dr. Johney Maine.   04/15/2018:  Chest, abdomen, and pelvis CT revealed innumerable (> 100) cavitary nodules scattered in the lungs, moderately enlarging compared to the 11/08/2017 PET-CT,  suspicious for metastatic disease.  One index node in the RLL measures 1.0 x 1.1 cm (previously 0.6 x 0.6 cm).  There were no new nodules.  There was an ill-defined wall thickening in the rectosigmoid with surrounding stranding along fascia planes, probably sequela from prior radiation therapy.  There was multilevel lumbar impingement due to spondylosis and degenerative disc disease.  There was heterogeneous enhancement in the uterus (some possibly from prior radiation therapy).   04/23/2018:  PET scan revealed numerous scattered solid and cavitary nodules in the lungs stable increased in size compared to the prior PET-CT from 11/08/2017. Largest nodule was 1.1 cm in the LUL (SUV 1.9).  These demonstrated low-grade metabolic activity up to a maximum SUV of about 2.3, increased from 11/08/2017.    Case was discussed at tumor board on 04/25/2018. Consensus to pursue CT-guided biopsy (05/06/18) which revealed: Metastatic adenocarcinoma, morphologically consistent with cervical adenocarcinoma.  She has history of chronic hepatitis C which is managed by GI.  Hepatitis C genotype is 2a/2c.  She receives B12 injections for history of B12 deficiency.  On 04/24/2018 she underwent left total knee replacement with Dr. [name]. --------------------------------------------------------   DIAGNOSIS: Cervical adenocarcinoma  STAGE: IV        ;GOALS: Palliative  CURRENT/MOST RECENT THERAPY : Alimta [C]    Malignant neoplasm of overlapping sites of cervix ([place])  01/29/2019 - 04/23/2019 Chemotherapy   The patient had PACLitaxel (TAXOL) 156 mg in sodium chloride 0.9 % 250 mL chemo infusion (</= 3m/m2), 80 mg/m2 = 156 mg, Intravenous,  Once, 3 of 4 cycles Dose modification: 65 mg/m2 (original dose 80 mg/m2, Cycle 1, Reason: Provider Judgment) Administration: 156 mg (01/29/2019), 156 mg (02/05/2019),  126 mg (02/12/2019), 126 mg (02/26/2019), 126 mg (03/13/2019), 126 mg (03/27/2019), 126 mg (03/20/2019), 126 mg (04/03/2019), 126 mg  (04/10/2019)  for chemotherapy treatment.    05/08/2019 - 07/30/2019 Chemotherapy   The patient had pembrolizumab (KEYTRUDA) 200 mg in sodium chloride 0.9 % 50 mL chemo infusion, 200 mg, Intravenous, Once, 4 of 6 cycles Administration: 200 mg (05/08/2019), 200 mg (05/29/2019), 200 mg (06/19/2019), 200 mg (07/10/2019)  for chemotherapy treatment.    08/07/2019 -  Chemotherapy   The patient had pegfilgrastim-cbqv [facility]) injection 6 mg, 6 mg, Subcutaneous, Once, 3 of 5 cycles Administration: 6 mg (08/29/2019), 6 mg (09/19/2019) PEMEtrexed (ALIMTA) 1,000 mg in sodium chloride 0.9 % 100 mL chemo infusion, 950 mg, Intravenous,  Once, 4 of 6 cycles Administration: 1,000 mg (08/07/2019), 1,000 mg (09/18/2019), 1,000 mg (11/13/2019), 1,000 mg (08/28/2019)  for chemotherapy treatment.      Interval history-Joanna Hall, 54 year old female diagnosed with metastatic cervical cancer, presents to symptom management clinic for complaints of vomiting and weakness that occurred over the weekend.  Reports having eaten at a [employer] and being around family members over the weekend and then suffering emesis unrelieved by Zofran and Phenergan.  Some diarrhea. Tolerating food and fluids this morning.  Continues to have weakness and says she feels dehydrated.  She scheduled for surgery on her right knee on 12/24/2019.  Appetite is reduced but denies weight loss.  Weight is roughly stable over the past year.  No chest pain or shortness of breath.  No dizziness or weakness.  No fevers or chills.  Denies abdominal pain.  Has chronic pain secondary to malignancy which is stable and unchanged.  Well-controlled with current pain regimen.  Denies urinary complaints.  ECOG FS:1 - Symptomatic but completely ambulatory  Review of systems- Review of Systems  Constitutional: Positive for malaise/fatigue. Negative for chills, fever and weight loss.  HENT: Negative for hearing loss, nosebleeds, sore throat and tinnitus.   Eyes:  Negative for blurred vision and double vision.  Respiratory: Negative for cough, hemoptysis, shortness of breath and wheezing.   Cardiovascular: Negative for chest pain, palpitations and leg swelling.  Gastrointestinal: Negative for abdominal pain, blood in stool, constipation, diarrhea, melena, nausea and vomiting.  Genitourinary: Negative for dysuria and urgency.  Musculoskeletal: Negative for back pain, falls, joint pain and myalgias.  Skin: Negative for itching and rash.  Neurological: Negative for dizziness, tingling, sensory change, loss of consciousness, weakness and headaches.  Endo/Heme/Allergies: Negative for environmental allergies. Does not bruise/bleed easily.  Psychiatric/Behavioral: Negative for depression. The patient is not nervous/anxious and does not have insomnia.      Current treatment-Alimta on 11/13/2019  Allergies  Allergen Reactions  . Ketamine Anxiety and Other (See Comments)    Syncope episode/confusion     Past Medical History:  Diagnosis Date  . Abdominal pain 06/10/2018  . Abnormal cervical Papanicolaou smear 09/18/2017  . Anxiety   . Aortic atherosclerosis ([place])   . Arthritis    neck and knees  . 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 Blood transfusion without reported diagnosis   . Cervical cancer ([place]) 09/2016   mets lung  . Chronic anal fissure   . Chronic diarrhea   . Dyspnea   . Erosive gastropathy 09/18/2017  . Factor V Leiden mutation ([place])   . Fecal incontinence   . Genital warts   . GERD (gastroesophageal reflux disease)   . GI bleed 10/08/2018  . Heart murmur   .  Hematochezia   . Hemorrhoids   . 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 Hepatitis, chronic ([place]) 05/05/2017  . History of cancer chemotherapy    completed 06/2017  . History of Clostridium difficile infection    while undergoing chemo.  Negative test 10/2017  . Ileocolic anastomotic leak   . 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 Intestinal infection due to Clostridium difficile 09/18/2017  . Macrocytic anemia with vitamin B12 deficiency   . Multiple gastric ulcers   . Nausea vomiting and diarrhea   . Pancolitis ([place]) 07/27/2018  . Perianal condylomata   . Pneumonia    History of  . Pulmonary nodules   . Rectal bleeding   . Small bowel obstruction ([place]) 08/2017  . Stiff neck    limited right turn  . Vitamin D deficiency     Past Surgical History:  Procedure Laterality Date  . CHOLECYSTECTOMY    . COLON SURGERY  08/2017   resection  . COLONOSCOPY WITH PROPOFOL N/A 12/20/2017   Procedure: COLONOSCOPY WITH PROPOFOL;  Surgeon: [name], MD;  Location: [facility] ENDOSCOPY;  Service: Gastroenterology;  Laterality: N/A;  . COLONOSCOPY WITH PROPOFOL N/A 07/30/2018   Procedure: COLONOSCOPY WITH PROPOFOL;  Surgeon: [name], MD;  Location: [facility] ENDOSCOPY;  Service: Gastroenterology;  Laterality: N/A;  . COLONOSCOPY WITH PROPOFOL N/A 10/10/2018   Procedure: COLONOSCOPY WITH PROPOFOL;  Surgeon: Lucilla Lame, MD;  Location: [facility] ENDOSCOPY;  Service: Endoscopy;  Laterality: N/A;  . DIAGNOSTIC LAPAROSCOPY    . ESOPHAGOGASTRODUODENOSCOPY (EGD) WITH PROPOFOL N/A 12/20/2017   Procedure: ESOPHAGOGASTRODUODENOSCOPY (EGD) WITH PROPOFOL;  Surgeon: [name], MD;  Location: [facility];  Service: Gastroenterology;  Laterality: N/A;  . ESOPHAGOGASTRODUODENOSCOPY (EGD) WITH PROPOFOL  07/30/2018   Procedure: ESOPHAGOGASTRODUODENOSCOPY (EGD) WITH PROPOFOL;  Surgeon: [name], MD;  Location: [facility] ENDOSCOPY;  Service: Gastroenterology;;  . Otho Darner SIGMOIDOSCOPY N/A 11/21/2018   Procedure: FLEXIBLE SIGMOIDOSCOPY;  Surgeon: [name], MD;  Location: [facility] ENDOSCOPY;  Service: Gastroenterology;  Laterality: N/A;  . LAPAROTOMY N/A 08/31/2017   Procedure: EXPLORATORY LAPAROTOMY for SBO, ileocolectomy, removal of piece of uterine wall;  Surgeon: [name], MD;   Location: [facility] ORS;  Service: General;  Laterality: N/A;  . LASER ABLATION CONDOLAMATA N/A 02/22/2018   Procedure: LASER ABLATION/REMOVAL OF [id number] AROUND ANUS AND VAGINA;  Surgeon: Michael Boston, MD;  Location: [facility];  Service: General;  Laterality: N/A;  . OOPHORECTOMY    . PORTA CATH INSERTION N/A 05/13/2018   Procedure: PORTA CATH INSERTION;  Surgeon: Katha Cabal, MD;  Location: [facility];  Service: Cardiovascular;  Laterality: N/A;  . SMALL INTESTINE SURGERY    . TANDEM RING INSERTION     x3  . THORACOTOMY    . TOTAL KNEE ARTHROPLASTY Left 04/24/2018   Procedure: TOTAL KNEE ARTHROPLASTY;  Surgeon: Lovell Sheehan, MD;  Location: [facility] ORS;  Service: Orthopedics;  Laterality: Left;    Social History   Socioeconomic History  . Marital status: Divorced    Spouse name: Not on file  . Number of children: Not on file  . Years of education: Not on file  . Highest education level: Not on file  Occupational History  . Not on file  Tobacco Use  . Smoking status: Former Smoker    Packs/day: 0.25    Years: 10.00    Pack years: 2.50    Types: Cigarettes  Quit date: 10/16/2006    Years since quitting: 13.1  . Smokeless tobacco: Never Used  Substance and Sexual Activity  . Alcohol use: Not Currently    Comment: seldom  . Drug use: Yes    Types: Marijuana    Comment: not very often  . Sexual activity: Not Currently    Birth control/protection: Post-menopausal    Comment: Not Asked  Other Topics Concern  . Not on file  Social History Narrative  . Not on file   Social Determinants of Health   Financial Resource Strain:   . Difficulty of Paying Living Expenses: Not on file  Food Insecurity:   . Worried About Charity fundraiser in the Last Year: Not on file  . Ran Out of Food in the Last Year: Not on file  Transportation Needs:   . Lack of Transportation (Medical): Not on file  . Lack of Transportation (Non-Medical): Not on file    Physical Activity:   . Days of Exercise per Week: Not on file  . Minutes of Exercise per Session: Not on file  Stress:   . Feeling of Stress : Not on file  Social Connections:   . Frequency of Communication with Friends and Family: Not on file  . Frequency of Social Gatherings with Friends and Family: Not on file  . Attends Religious Services: Not on file  . Active Member of Clubs or Organizations: Not on file  . Attends Archivist Meetings: Not on file  . Marital Status: Not on file  Intimate Partner Violence:   . Fear of Current or Ex-Partner: Not on file  . Emotionally Abused: Not on file  . Physically Abused: Not on file  . Sexually Abused: Not on file    Family History  Problem Relation Age of Onset  . Hypertension Mother   . Hypertension Father   . Diabetes Father   . Hyperlipidemia Father   . Alcohol abuse Daughter   . Hypertension Maternal Grandmother   . Diabetes Maternal Grandmother   . Diabetes Paternal Grandmother   . Hypertension Paternal Grandmother      Current Outpatient Medications:  .  enoxaparin (LOVENOX) 120 MG/0.8ML injection, Inject 0.8 mLs (120 mg total) into the skin daily., Disp: 8 mL, Rfl: 0 .  famotidine (PEPCID) 20 MG tablet, Take 1 tablet (20 mg total) by mouth 2 (two) times daily., Disp: 60 tablet, Rfl: 0 .  folic acid (FOLVITE) 1 MG tablet, Take 1 tablet (1 mg total) by mouth daily., Disp: 30 tablet, Rfl: 6 .  hydrocortisone (CORTEF) 10 MG tablet, Take 1 tablet (10 mg total) by mouth 2 (two) times daily., Disp: 180 tablet, Rfl: 0 .  hydrOXYzine (VISTARIL) 25 MG capsule, Take 1 capsule (25 mg total) by mouth 3 (three) times daily as needed for itching., Disp: 30 capsule, Rfl: 0 .  hyoscyamine (LEVBID) 0.375 MG 12 hr tablet, Take 1 tablet (0.375 mg total) by mouth 2 (two) times daily., Disp: 60 tablet, Rfl: 0 .  Multiple Vitamins-Minerals (SUPER THERA VITE M) TABS, Take 1 tablet by mouth daily. , Disp: , Rfl:  .  ondansetron (ZOFRAN  ODT) 4 MG disintegrating tablet, Take 1 tablet (4 mg total) by mouth every 8 (eight) hours as needed for nausea or vomiting., Disp: 20 tablet, Rfl: 0 .  oxyCODONE (OXYCONTIN) 20 mg 12 hr tablet, Take 1 tablet (20 mg total) by mouth every 8 (eight) hours., Disp: 90 tablet, Rfl: 0 .  Oxycodone HCl 10 MG  TABS, Take 1-2 tablets (10-20 mg total) by mouth every 6 (six) hours as needed (for breakthrough pain). (Patient taking differently: Take 10 mg by mouth every 6 (six) hours as needed (for breakthrough pain). ), Disp: 120 tablet, Rfl: 0 .  pantoprazole (PROTONIX) 40 MG tablet, Take 1 tablet (40 mg total) by mouth daily., Disp: 90 tablet, Rfl: 3 .  potassium chloride SA (KLOR-CON) 20 MEQ tablet, Take 1 tablet (20 mEq total) by mouth 2 (two) times daily., Disp: 30 tablet, Rfl: 0 .  promethazine (PHENERGAN) 25 MG tablet, Take 1 tablet (25 mg total) by mouth every 8 (eight) hours as needed for nausea or vomiting., Disp: 30 tablet, Rfl: 6 .  sertraline (ZOLOFT) 25 MG tablet, Take 1 tablet (25 mg total) by mouth daily., Disp: 30 tablet, Rfl: 0 .  triamcinolone ointment (KENALOG) 0.5 %, Apply 1 application topically 2 (two) times daily. (Patient taking differently: Apply 1 application topically 2 (two) times daily as needed (itching). ), Disp: 30 g, Rfl: 0 .  VENTOLIN HFA 108 (90 Base) MCG/ACT inhaler, Inhale 1-2 puffs into the lungs every 4 (four) hours as needed for shortness of breath., Disp: 1 Inhaler, Rfl: 1 .  amitriptyline (ELAVIL) 100 MG tablet, Take 1 tablet (100 mg total) by mouth at bedtime., Disp: 90 tablet, Rfl: 1 .  Calcium Carb-Cholecalciferol (CALCIUM 500 +D) 500-400 MG-UNIT TABS, Take 1 tablet by mouth daily. , Disp: , Rfl:  .  colchicine 0.6 MG tablet, Take 0.6 mg by mouth daily. , Disp: , Rfl:  .  cyanocobalamin (,VITAMIN B-12,) 1000 MCG/ML injection, Inject 1,000 mcg into the muscle every 21 ( twenty-one) days. , Disp: , Rfl:  .  dexamethasone (DECADRON) 4 MG tablet, Take 4 mg by mouth See  admin instructions. Take 4 mg twice daily for 3 days starting 3 days prior to chemo, Disp: , Rfl:  .  diphenoxylate-atropine (LOMOTIL) 2.5-0.025 MG tablet, Take 2 tablets by mouth 2 (two) times daily as needed for diarrhea or loose stools. (Patient not taking: Reported on 12/10/2019), Disp: , Rfl:  .  DM-Phenylephrine-Acetaminophen (COLD RELIEF PO), Take 2 tablets by mouth daily as needed (cold symptoms)., Disp: , Rfl:  .  rivaroxaban (XARELTO) 20 MG TABS tablet, Take 1 tablet (20 mg total) by mouth every morning. Takes in the morning (Patient not taking: Reported on 12/22/2019), Disp: , Rfl:  No current facility-administered medications for this visit.  Facility-Administered Medications Ordered in Other Visits:  .  heparin lock flush 100 unit/mL, 500 Units, Intravenous, Once, Corcoran, Melissa C, MD .  sodium chloride 0.9 % 1,000 mL with potassium chloride 20 mEq infusion, , Intravenous, Once, Honor Loh E, NP .  sodium chloride flush (NS) 0.9 % injection 10 mL, 10 mL, Intravenous, Once, Borders, Kirt Boys, NP  Physical exam:  Vitals:   12/22/19 1344  BP: (!) 167/102  Pulse: 90  Resp: 20  Temp: (!) 96.5 F (35.8 C)  TempSrc: Tympanic   Physical Exam Constitutional:      Appearance: She is well-developed.     Comments: Unaccompanied.  Wearing mask.  HENT:     Head: Atraumatic.     Nose: Nose normal.     Mouth/Throat:     Pharynx: No oropharyngeal exudate.  Eyes:     General: No scleral icterus.    Conjunctiva/sclera: Conjunctivae normal.  Pulmonary:     Effort: Pulmonary effort is normal. No respiratory distress.  Abdominal:     General: Bowel sounds are normal. There is  no distension.     Palpations: Abdomen is soft.     Tenderness: There is abdominal tenderness (mild RLQ tenderness). There is no rebound.  Musculoskeletal:        General: No deformity.     Cervical back: Normal range of motion and neck supple.  Skin:    General: Skin is warm and dry.     Coloration: Skin is  not pale.  Neurological:     Mental Status: She is alert and oriented to person, place, and time.     Motor: No weakness.  Psychiatric:        Mood and Affect: Mood normal.        Behavior: Behavior normal.      CMP Latest Ref Rng & Units 12/22/2019  Glucose 70 - 99 mg/dL 101(H)  BUN 6 - 20 mg/dL 11  Creatinine 0.44 - 1.00 mg/dL 1.32(H)  Sodium 135 - 145 mmol/L 134(L)  Potassium 3.5 - 5.1 mmol/L 3.4(L)  Chloride 98 - 111 mmol/L 100  CO2 22 - 32 mmol/L 23  Calcium 8.9 - 10.3 mg/dL 9.5  Total Protein 6.5 - 8.1 g/dL -  Total Bilirubin 0.3 - 1.2 mg/dL -  Alkaline Phos 38 - 126 U/L -  AST 15 - 41 U/L -  ALT 0 - 44 U/L -   CBC Latest Ref Rng & Units 12/22/2019  WBC 4.0 - 10.5 K/uL 5.6  Hemoglobin 12.0 - 15.0 g/dL 13.6  Hematocrit 36.0 - 46.0 % 39.4  Platelets 150 - 400 K/uL 250    No images are attached to the encounter.  DG Chest 2 View  Result Date: 12/11/2019 CLINICAL DATA:  Cough.  Cervical carcinoma EXAM: CHEST - 2 VIEW COMPARISON:  October 18, 2019 chest radiograph and chest CT October 06, 2019 FINDINGS: There is atelectatic change in the left base. There are scattered subcentimeter nodular lesions consistent with metastatic disease. Heart size and pulmonary vascular normal. No adenopathy. Port-A-Cath tip is in the superior vena cava. No pneumothorax. No bone lesions IMPRESSION: Atelectatic change lateral left base with questionable earliest changes of pneumonia. Multiple subcentimeter nodular lesions consistent with metastatic foci. No adenopathy. Heart size normal. Port-A-Cath tip in superior vena cava. Electronically Signed   By: Lowella Grip III M.D.   On: 12/11/2019 10:41    Assessment and plan- Patient is a 54 y.o. female diagnosed with metastatic cervical cancer who presents to symptom management clinic for weakness, diarrhea and vomiting.  Hemodynamically stable.  Labs overall stable.  Creatinine elevated compared to baseline consistent with fluid losses.  Potassium  slightly decreased at 3.4 also consistent with GI losses.  UA negative for infection.  IV fluids in clinic.  Tolerating food and fluids.  Okay to discharge home.  No leukocytosis.  Afebrile.  Suspect viral etiology.  Continue supportive care with Zofran and Phenergan as prescribed.  If fevers or worsening pain return to clinic sooner for reevaluation and/or imaging.  Disposition: Follow up with Dr. Rogue Bussing as scheduled. Patient advised to notify the clinic if there is no improvement in symptoms or if symptoms worsen in next 2-3 days.    Visit Diagnosis 1. Viral gastroenteritis   2. Elevated serum creatinine     Patient expressed understanding and was in agreement with this plan. She also understands that She can call clinic at any time with any questions, concerns, or complaints.   Thank you for allowing me to participate in the care of this very pleasant patient.   Beckey Rutter,  DNP, AGNP-[facility]  CC: Dr. Rogue Bussing

## 2019-12-22 NOTE — Telephone Encounter (Signed)
Patient accepts 130 appointment for [facility] and IV fluids Labs drawn this morning for pre op already

## 2019-12-22 NOTE — Patient Instructions (Signed)
Viral Gastroenteritis, Adult  Viral gastroenteritis is also known as the stomach flu. This condition may affect your stomach, your small intestine, and your large intestine. It can cause sudden watery poop (diarrhea), fever, and throwing up (vomiting). This condition is caused by certain germs (viruses). These germs can be passed from person to person very easily (are contagious). Having watery poop and throwing up can make you feel weak and cause you to not have enough water in your body (get dehydrated). This can make you tired and thirsty, make you have a dry mouth, and make it so you pee (urinate) less often. It is important to replace the fluids that you lose from having watery poop and throwing up. What are the causes?  You can get sick by catching viruses from other people.  You can also get sick by: ? Eating food, drinking water, or touching a surface that has the viruses on it (is contaminated). ? Sharing utensils or other personal items with a person who is sick. What increases the risk?  Having a weak body defense system (immune system).  Living with one or more children who are younger than 31 years old.  Living in a nursing home.  Going on cruise ships. What are the signs or symptoms? Symptoms of this condition start suddenly. Symptoms may last for a few days or for as long as a week.  Common symptoms include: ? Watery poop. ? Throwing up.  Other symptoms include: ? Fever. ? Headache. ? Feeling tired (fatigue). ? Pain in the belly (abdomen). ? Chills. ? Feeling weak. ? Feeling sick to your stomach (nauseous). ? Muscle aches. ? Not feeling hungry. How is this treated?  This condition typically goes away on its own.  The focus of treatment is to replace the fluids that you lose. This condition may be treated with: ? An ORS (oral rehydration solution). This is a drink that is sold at pharmacies and stores. ? Medicines to help with your symptoms. ? Probiotic  supplements to reduce symptoms of diarrhea. ? Fluids given through an IV tube, if needed.  Older adults and people with other diseases or a weak body defense system are at higher risk for not having enough water in the body. Follow these instructions at home: Eating and drinking   Take an ORS as told by your doctor.  Drink clear fluids in small amounts as you are able. Clear fluids include: ? Water. ? Ice chips. ? Fruit juice with water added to it (diluted). ? Low-calorie sports drinks.  Drink enough fluid to keep your pee (urine) pale yellow.  Eat small amounts of healthy foods every 3-4 hours as you are able. This may include whole grains, fruits, vegetables, lean meats, and yogurt.  Avoid fluids that have a lot of sugar or caffeine in them, such as energy drinks, sports drinks, and soda.  Avoid spicy or fatty foods.  Avoid alcohol. General instructions   Wash your hands often. This is very important after you have watery poop or you throw up. If you cannot use soap and water, use hand sanitizer.  Make sure that all people in your home wash their hands well and often.  Take over-the-counter and prescription medicines only as told by your doctor.  Rest at home while you get better.  Watch your condition for any changes.  Take a warm bath to help with any burning or pain from having watery poop.  Keep all follow-up visits as told by your doctor.  This is important. Contact a doctor if:  You cannot keep fluids down.  Your symptoms get worse.  You have new symptoms.  You feel light-headed.  You feel dizzy.  You have muscle cramps. Get help right away if:  You have chest pain.  You feel very weak.  You pass out (faint).  You see blood in your throw-up.  Your throw-up looks like coffee grounds.  You have bloody or black poop (stools) or poop that looks like tar.  You have a very bad headache, or a stiff neck, or both.  You have a rash.  You have  very bad pain, cramping, or bloating in your belly.  You have trouble breathing.  You are breathing very quickly.  You have a fast heartbeat.  Your skin feels cold and clammy.  You feel mixed up (confused).  You have pain when you pee.  You have signs of not having enough water in the body, such as: ? Dark pee, hardly any pee, or no pee. ? Cracked lips. ? Dry mouth. ? Sunken eyes. ? Feeling very sleepy. ? Feeling weak. Summary  Viral gastroenteritis is also known as the stomach flu.  This condition can cause sudden watery poop (diarrhea), fever, and throwing up (vomiting).  These germs can be passed from person to person very easily.  Take an ORS as told by your doctor. This is a drink that is sold at pharmacies and stores.  Drink fluids in small amounts many times each day as you are able. This information is not intended to replace advice given to you by your health care provider. Make sure you discuss any questions you have with your health care provider. Document Revised: 08/07/2018 Document Reviewed: 08/07/2018 Elsevier Patient Education  2020 [employer].

## 2019-12-22 NOTE — Telephone Encounter (Signed)
Time with me for fluids x 1 hour; labs- cbc/cmp. Do we have chairs or [facility]?  GB

## 2019-12-22 NOTE — Telephone Encounter (Signed)
Patient called stating that she has been sick for 3 days and is weak. She is for knee surgery on Wednesday and is asking to come in for IV fluids today Please advise.

## 2019-12-24 ENCOUNTER — Encounter
Admission: RE | Disposition: A | Discharge status: Home / Self Care | Payer: Self-pay | Source: Home / Self Care | Attending: Orthopedic Surgery

## 2019-12-24 ENCOUNTER — Inpatient Hospital Stay: Payer: Medicaid Other

## 2019-12-24 ENCOUNTER — Other Ambulatory Visit: Payer: Self-pay

## 2019-12-24 ENCOUNTER — Observation Stay
Admission: RE | Admit: 2019-12-24 | Discharge: 2019-12-25 | Disposition: A | Discharge status: Home / Self Care | Payer: Medicaid Other | Attending: Orthopedic Surgery | Admitting: Orthopedic Surgery

## 2019-12-24 ENCOUNTER — Encounter: Payer: Self-pay | Admitting: Orthopedic Surgery

## 2019-12-24 ENCOUNTER — Encounter: Payer: Self-pay | Admitting: Internal Medicine

## 2019-12-24 DIAGNOSIS — Z9221 Personal history of antineoplastic chemotherapy: Secondary | ICD-10-CM | POA: Diagnosis not present

## 2019-12-24 DIAGNOSIS — Z87891 Personal history of nicotine dependence: Secondary | ICD-10-CM | POA: Diagnosis not present

## 2019-12-24 DIAGNOSIS — Z923 Personal history of irradiation: Secondary | ICD-10-CM | POA: Insufficient documentation

## 2019-12-24 DIAGNOSIS — M25761 Osteophyte, right knee: Secondary | ICD-10-CM | POA: Diagnosis not present

## 2019-12-24 DIAGNOSIS — C7800 Secondary malignant neoplasm of unspecified lung: Secondary | ICD-10-CM | POA: Diagnosis not present

## 2019-12-24 DIAGNOSIS — M1711 Unilateral primary osteoarthritis, right knee: Secondary | ICD-10-CM | POA: Diagnosis not present

## 2019-12-24 DIAGNOSIS — Z96652 Presence of left artificial knee joint: Secondary | ICD-10-CM | POA: Insufficient documentation

## 2019-12-24 DIAGNOSIS — Z96651 Presence of right artificial knee joint: Secondary | ICD-10-CM

## 2019-12-24 DIAGNOSIS — Z884 Allergy status to anesthetic agent status: Secondary | ICD-10-CM | POA: Diagnosis not present

## 2019-12-24 DIAGNOSIS — Z7901 Long term (current) use of anticoagulants: Secondary | ICD-10-CM | POA: Insufficient documentation

## 2019-12-24 DIAGNOSIS — M25569 Pain in unspecified knee: Secondary | ICD-10-CM

## 2019-12-24 DIAGNOSIS — Z96659 Presence of unspecified artificial knee joint: Secondary | ICD-10-CM

## 2019-12-24 HISTORY — PX: TOTAL KNEE ARTHROPLASTY: SHX125

## 2019-12-24 LAB — SURGICAL PCR SCREEN
MRSA, PCR: NEGATIVE
Staphylococcus aureus: NEGATIVE

## 2019-12-24 LAB — URINE DRUG SCREEN, QUALITATIVE (ARMC ONLY)
Amphetamines, Ur Screen: NOT DETECTED
Barbiturates, Ur Screen: NOT DETECTED
Benzodiazepine, Ur Scrn: POSITIVE — AB
Cannabinoid 50 Ng, Ur ~~LOC~~: POSITIVE — AB
Cocaine Metabolite,Ur ~~LOC~~: NOT DETECTED
MDMA (Ecstasy)Ur Screen: NOT DETECTED
Methadone Scn, Ur: NOT DETECTED
Opiate, Ur Screen: POSITIVE — AB
Phencyclidine (PCP) Ur S: NOT DETECTED
Tricyclic, Ur Screen: POSITIVE — AB

## 2019-12-24 LAB — TYPE AND SCREEN
ABO/RH(D): O POS
Antibody Screen: NEGATIVE

## 2019-12-24 SURGERY — ARTHROPLASTY, KNEE, TOTAL
Anesthesia: Spinal | Site: Knee | Laterality: Right

## 2019-12-24 MED ORDER — COLCHICINE 0.6 MG PO TABS
0.6000 mg | ORAL_TABLET | Freq: Every day | ORAL | Status: DC
  Administered 2019-12-24 – 2019-12-25 (×2): 0.6 mg via ORAL
  Filled 2019-12-24 (×2): qty 1

## 2019-12-24 MED ORDER — CHLORHEXIDINE GLUCONATE 4 % EX LIQD
60.0000 mL | Freq: Once | CUTANEOUS | Status: AC
  Administered 2019-12-24: 4 via TOPICAL

## 2019-12-24 MED ORDER — METHOCARBAMOL 500 MG PO TABS
500.0000 mg | ORAL_TABLET | Freq: Four times a day (QID) | ORAL | Status: DC | PRN
  Administered 2019-12-25: 500 mg via ORAL
  Filled 2019-12-24: qty 1

## 2019-12-24 MED ORDER — SODIUM CHLORIDE FLUSH 0.9 % IV SOLN
INTRAVENOUS | Status: AC
  Filled 2019-12-24: qty 20

## 2019-12-24 MED ORDER — ALBUTEROL SULFATE (2.5 MG/3ML) 0.083% IN NEBU: 2.5000 mg | INHALATION_SOLUTION | RESPIRATORY_TRACT | Status: DC | PRN

## 2019-12-24 MED ORDER — OXYCODONE HCL 5 MG PO TABS: 5.0000 mg | ORAL_TABLET | Freq: Once | ORAL | Status: DC | PRN

## 2019-12-24 MED ORDER — CEFAZOLIN SODIUM-DEXTROSE 2-4 GM/100ML-% IV SOLN
2.0000 g | Freq: Four times a day (QID) | INTRAVENOUS | Status: AC
  Administered 2019-12-24 (×2): 2 g via INTRAVENOUS
  Filled 2019-12-24 (×2): qty 100

## 2019-12-24 MED ORDER — SODIUM CHLORIDE FLUSH 0.9 % IV SOLN
INTRAVENOUS | Status: AC
  Filled 2019-12-24: qty 40

## 2019-12-24 MED ORDER — BUPIVACAINE HCL (PF) 0.5 % IJ SOLN
INTRAMUSCULAR | Status: DC | PRN
  Administered 2019-12-24: 25 mL

## 2019-12-24 MED ORDER — TRANEXAMIC ACID-NACL 1000-0.7 MG/100ML-% IV SOLN
1000.0000 mg | INTRAVENOUS | Status: AC
  Administered 2019-12-24: 1000 mg via INTRAVENOUS

## 2019-12-24 MED ORDER — EPHEDRINE 5 MG/ML INJ
INTRAVENOUS | Status: AC
  Filled 2019-12-24: qty 10

## 2019-12-24 MED ORDER — MIDAZOLAM HCL 5 MG/5ML IJ SOLN
INTRAMUSCULAR | Status: DC | PRN
  Administered 2019-12-24: 2 mg via INTRAVENOUS

## 2019-12-24 MED ORDER — FENTANYL CITRATE (PF) 100 MCG/2ML IJ SOLN
INTRAMUSCULAR | Status: DC | PRN
  Administered 2019-12-24: 25 ug via INTRAVENOUS
  Administered 2019-12-24: 50 ug via INTRAVENOUS
  Administered 2019-12-24: 25 ug via INTRAVENOUS

## 2019-12-24 MED ORDER — ONDANSETRON HCL 4 MG/2ML IJ SOLN
INTRAMUSCULAR | Status: DC | PRN
  Administered 2019-12-24: 4 mg via INTRAVENOUS

## 2019-12-24 MED ORDER — KETOROLAC TROMETHAMINE 15 MG/ML IJ SOLN
15.0000 mg | Freq: Four times a day (QID) | INTRAMUSCULAR | Status: AC
  Administered 2019-12-24 – 2019-12-25 (×4): 15 mg via INTRAVENOUS
  Filled 2019-12-24 (×4): qty 1

## 2019-12-24 MED ORDER — RIVAROXABAN 20 MG PO TABS
20.0000 mg | ORAL_TABLET | Freq: Every morning | ORAL | Status: DC
  Filled 2019-12-24: qty 1

## 2019-12-24 MED ORDER — MENTHOL 3 MG MT LOZG
1.0000 | LOZENGE | OROMUCOSAL | Status: DC | PRN
  Filled 2019-12-24: qty 9

## 2019-12-24 MED ORDER — DOCUSATE SODIUM 100 MG PO CAPS
100.0000 mg | ORAL_CAPSULE | Freq: Two times a day (BID) | ORAL | Status: DC
  Administered 2019-12-24 – 2019-12-25 (×2): 100 mg via ORAL
  Filled 2019-12-24 (×2): qty 1

## 2019-12-24 MED ORDER — BUPIVACAINE HCL (PF) 0.5 % IJ SOLN
INTRAMUSCULAR | Status: DC | PRN
  Administered 2019-12-24: 2.8 mL via INTRATHECAL

## 2019-12-24 MED ORDER — LACTATED RINGERS IV SOLN: INTRAVENOUS | Status: DC

## 2019-12-24 MED ORDER — HYDROCORTISONE 10 MG PO TABS
10.0000 mg | ORAL_TABLET | Freq: Two times a day (BID) | ORAL | Status: DC
  Administered 2019-12-24 – 2019-12-25 (×2): 10 mg via ORAL
  Filled 2019-12-24 (×3): qty 1

## 2019-12-24 MED ORDER — MUPIROCIN 2 % EX OINT
1.0000 "application " | TOPICAL_OINTMENT | Freq: Two times a day (BID) | CUTANEOUS | Status: DC
  Filled 2019-12-24: qty 22

## 2019-12-24 MED ORDER — LIDOCAINE HCL (PF) 2 % IJ SOLN
INTRAMUSCULAR | Status: AC
  Filled 2019-12-24: qty 10

## 2019-12-24 MED ORDER — CEFAZOLIN SODIUM-DEXTROSE 2-4 GM/100ML-% IV SOLN
2.0000 g | INTRAVENOUS | Status: AC
  Administered 2019-12-24: 2 g via INTRAVENOUS

## 2019-12-24 MED ORDER — BISACODYL 10 MG RE SUPP: 10.0000 mg | Freq: Every day | RECTAL | Status: DC | PRN

## 2019-12-24 MED ORDER — FENTANYL CITRATE (PF) 100 MCG/2ML IJ SOLN: 25.0000 ug | INTRAMUSCULAR | Status: DC | PRN

## 2019-12-24 MED ORDER — FAMOTIDINE 20 MG PO TABS
20.0000 mg | ORAL_TABLET | Freq: Two times a day (BID) | ORAL | Status: DC
  Administered 2019-12-24 – 2019-12-25 (×2): 20 mg via ORAL
  Filled 2019-12-24 (×2): qty 1

## 2019-12-24 MED ORDER — PROPOFOL 500 MG/50ML IV EMUL
INTRAVENOUS | Status: AC
  Filled 2019-12-24: qty 50

## 2019-12-24 MED ORDER — ONDANSETRON HCL 4 MG/2ML IJ SOLN
INTRAMUSCULAR | Status: AC
  Filled 2019-12-24: qty 2

## 2019-12-24 MED ORDER — OXYCODONE HCL 5 MG/5ML PO SOLN: 5.0000 mg | Freq: Once | ORAL | Status: DC | PRN

## 2019-12-24 MED ORDER — ACETAMINOPHEN 10 MG/ML IV SOLN
1000.0000 mg | Freq: Once | INTRAVENOUS | Status: DC | PRN
  Administered 2019-12-24: 1000 mg via INTRAVENOUS

## 2019-12-24 MED ORDER — BUPIVACAINE HCL (PF) 0.5 % IJ SOLN
INTRAMUSCULAR | Status: AC
  Filled 2019-12-24: qty 30

## 2019-12-24 MED ORDER — MAGNESIUM CITRATE PO SOLN
1.0000 | Freq: Once | ORAL | Status: DC | PRN
  Filled 2019-12-24: qty 296

## 2019-12-24 MED ORDER — BUPIVACAINE-EPINEPHRINE (PF) 0.5% -1:200000 IJ SOLN
INTRAMUSCULAR | Status: AC
  Filled 2019-12-24: qty 30

## 2019-12-24 MED ORDER — DEXAMETHASONE SODIUM PHOSPHATE 10 MG/ML IJ SOLN
INTRAMUSCULAR | Status: AC
  Filled 2019-12-24: qty 1

## 2019-12-24 MED ORDER — DIPHENHYDRAMINE HCL 12.5 MG/5ML PO ELIX: 12.5000 mg | ORAL_SOLUTION | ORAL | Status: DC | PRN

## 2019-12-24 MED ORDER — HYOSCYAMINE SULFATE ER 0.375 MG PO TB12
0.3750 mg | ORAL_TABLET | Freq: Two times a day (BID) | ORAL | Status: DC
  Administered 2019-12-24 – 2019-12-25 (×2): 0.375 mg via ORAL
  Filled 2019-12-24 (×3): qty 1

## 2019-12-24 MED ORDER — LIDOCAINE HCL (CARDIAC) PF 100 MG/5ML IV SOSY
PREFILLED_SYRINGE | INTRAVENOUS | Status: DC | PRN
  Administered 2019-12-24: 60 mg via INTRAVENOUS

## 2019-12-24 MED ORDER — HYDROXYZINE HCL 25 MG PO TABS
25.0000 mg | ORAL_TABLET | Freq: Three times a day (TID) | ORAL | Status: DC | PRN
  Filled 2019-12-24: qty 1

## 2019-12-24 MED ORDER — ONDANSETRON HCL 4 MG PO TABS
4.0000 mg | ORAL_TABLET | Freq: Four times a day (QID) | ORAL | Status: DC | PRN
  Filled 2019-12-24: qty 1

## 2019-12-24 MED ORDER — FENTANYL CITRATE (PF) 100 MCG/2ML IJ SOLN
INTRAMUSCULAR | Status: AC
  Filled 2019-12-24: qty 2

## 2019-12-24 MED ORDER — OXYCODONE HCL 5 MG PO TABS
10.0000 mg | ORAL_TABLET | Freq: Four times a day (QID) | ORAL | Status: DC | PRN
  Administered 2019-12-24 – 2019-12-25 (×2): 10 mg via ORAL
  Filled 2019-12-24 (×3): qty 2

## 2019-12-24 MED ORDER — OXYCODONE HCL ER 10 MG PO T12A
20.0000 mg | EXTENDED_RELEASE_TABLET | Freq: Three times a day (TID) | ORAL | Status: DC
  Administered 2019-12-24 – 2019-12-25 (×3): 20 mg via ORAL
  Filled 2019-12-24 (×3): qty 2

## 2019-12-24 MED ORDER — DEXAMETHASONE SODIUM PHOSPHATE 10 MG/ML IJ SOLN
INTRAMUSCULAR | Status: DC | PRN
  Administered 2019-12-24: 5 mg via INTRAVENOUS

## 2019-12-24 MED ORDER — MIDAZOLAM HCL 2 MG/2ML IJ SOLN
INTRAMUSCULAR | Status: AC
  Filled 2019-12-24: qty 2

## 2019-12-24 MED ORDER — CEFAZOLIN SODIUM-DEXTROSE 2-4 GM/100ML-% IV SOLN
INTRAVENOUS | Status: AC
  Filled 2019-12-24: qty 100

## 2019-12-24 MED ORDER — PROPOFOL 500 MG/50ML IV EMUL
INTRAVENOUS | Status: DC | PRN
  Administered 2019-12-24: 75 ug/kg/min via INTRAVENOUS

## 2019-12-24 MED ORDER — ACETAMINOPHEN 500 MG PO TABS
500.0000 mg | ORAL_TABLET | Freq: Four times a day (QID) | ORAL | Status: AC
  Administered 2019-12-24 – 2019-12-25 (×4): 500 mg via ORAL
  Filled 2019-12-24 (×4): qty 1

## 2019-12-24 MED ORDER — ACETAMINOPHEN 10 MG/ML IV SOLN
INTRAVENOUS | Status: AC
  Filled 2019-12-24: qty 100

## 2019-12-24 MED ORDER — SODIUM CHLORIDE 0.9 % IV SOLN
INTRAVENOUS | Status: DC | PRN
  Administered 2019-12-24: 100 ug via INTRAVENOUS

## 2019-12-24 MED ORDER — MORPHINE SULFATE (PF) 2 MG/ML IV SOLN
0.5000 mg | INTRAVENOUS | Status: DC | PRN
  Administered 2019-12-24: 1 mg via INTRAVENOUS
  Filled 2019-12-24: qty 1

## 2019-12-24 MED ORDER — ONDANSETRON 4 MG PO TBDP
4.0000 mg | ORAL_TABLET | Freq: Three times a day (TID) | ORAL | Status: DC | PRN
  Filled 2019-12-24: qty 1

## 2019-12-24 MED ORDER — BUPIVACAINE HCL (PF) 0.5 % IJ SOLN
INTRAMUSCULAR | Status: AC
  Filled 2019-12-24: qty 10

## 2019-12-24 MED ORDER — SODIUM CHLORIDE 0.9 % IV SOLN
INTRAVENOUS | Status: DC | PRN
  Administered 2019-12-24: 60 mL

## 2019-12-24 MED ORDER — PROMETHAZINE HCL 25 MG PO TABS
25.0000 mg | ORAL_TABLET | Freq: Three times a day (TID) | ORAL | Status: DC | PRN
  Filled 2019-12-24: qty 1

## 2019-12-24 MED ORDER — CYANOCOBALAMIN 1000 MCG/ML IJ SOLN: 1000.0000 ug | INTRAMUSCULAR | Status: DC

## 2019-12-24 MED ORDER — LIDOCAINE HCL (PF) 1 % IJ SOLN
INTRAMUSCULAR | Status: AC
  Filled 2019-12-24: qty 5

## 2019-12-24 MED ORDER — ONDANSETRON HCL 4 MG/2ML IJ SOLN: 4.0000 mg | Freq: Once | INTRAMUSCULAR | Status: DC | PRN

## 2019-12-24 MED ORDER — TRANEXAMIC ACID-NACL 1000-0.7 MG/100ML-% IV SOLN
INTRAVENOUS | Status: AC
  Filled 2019-12-24: qty 100

## 2019-12-24 MED ORDER — POTASSIUM CHLORIDE CRYS ER 20 MEQ PO TBCR
20.0000 meq | EXTENDED_RELEASE_TABLET | Freq: Two times a day (BID) | ORAL | Status: DC
  Administered 2019-12-24 – 2019-12-25 (×3): 20 meq via ORAL
  Filled 2019-12-24 (×3): qty 1

## 2019-12-24 MED ORDER — BUPIVACAINE-EPINEPHRINE (PF) 0.5% -1:200000 IJ SOLN
INTRAMUSCULAR | Status: DC | PRN
  Administered 2019-12-24: 30 mL via PERINEURAL

## 2019-12-24 MED ORDER — BUPIVACAINE LIPOSOME 1.3 % IJ SUSP
INTRAMUSCULAR | Status: AC
  Filled 2019-12-24: qty 20

## 2019-12-24 MED ORDER — FOLIC ACID 1 MG PO TABS
1.0000 mg | ORAL_TABLET | Freq: Every day | ORAL | Status: DC
  Administered 2019-12-24 – 2019-12-25 (×2): 1 mg via ORAL
  Filled 2019-12-24 (×2): qty 1

## 2019-12-24 MED ORDER — PHENYLEPHRINE HCL (PRESSORS) 10 MG/ML IV SOLN
INTRAVENOUS | Status: AC
  Filled 2019-12-24: qty 1

## 2019-12-24 MED ORDER — ONDANSETRON HCL 4 MG/2ML IJ SOLN: 4.0000 mg | Freq: Four times a day (QID) | INTRAMUSCULAR | Status: DC | PRN

## 2019-12-24 MED ORDER — AMITRIPTYLINE HCL 25 MG PO TABS
100.0000 mg | ORAL_TABLET | Freq: Every day | ORAL | Status: DC
  Administered 2019-12-24: 21:00:00 100 mg via ORAL
  Filled 2019-12-24: qty 4

## 2019-12-24 MED ORDER — ALUM & MAG HYDROXIDE-SIMETH 200-200-20 MG/5ML PO SUSP: 30.0000 mL | ORAL | Status: DC | PRN

## 2019-12-24 MED ORDER — PANTOPRAZOLE SODIUM 40 MG PO TBEC
40.0000 mg | DELAYED_RELEASE_TABLET | Freq: Every day | ORAL | Status: DC
  Administered 2019-12-25: 40 mg via ORAL
  Filled 2019-12-24: qty 1

## 2019-12-24 MED ORDER — POVIDONE-IODINE 10 % EX SWAB: 2.0000 "application " | Freq: Once | CUTANEOUS | Status: DC

## 2019-12-24 MED ORDER — CHLORHEXIDINE GLUCONATE CLOTH 2 % EX PADS
6.0000 | MEDICATED_PAD | Freq: Every day | CUTANEOUS | Status: DC
  Administered 2019-12-25: 6 via TOPICAL

## 2019-12-24 MED ORDER — PROPOFOL 10 MG/ML IV BOLUS
INTRAVENOUS | Status: DC | PRN
  Administered 2019-12-24: 80 mg via INTRAVENOUS

## 2019-12-24 MED ORDER — BUPIVACAINE HCL (PF) 0.5 % IJ SOLN: INTRAMUSCULAR | Status: DC | PRN

## 2019-12-24 MED ORDER — SODIUM CHLORIDE 0.9 % IR SOLN
Status: DC | PRN
  Administered 2019-12-24: 200 mL

## 2019-12-24 MED ORDER — SERTRALINE HCL 50 MG PO TABS
25.0000 mg | ORAL_TABLET | Freq: Every day | ORAL | Status: DC
  Administered 2019-12-24 – 2019-12-25 (×2): 25 mg via ORAL
  Filled 2019-12-24 (×2): qty 1

## 2019-12-24 MED ORDER — BACITRACIN 50000 UNITS IM SOLR
INTRAMUSCULAR | Status: AC
  Filled 2019-12-24: qty 2

## 2019-12-24 MED ORDER — METOCLOPRAMIDE HCL 10 MG PO TABS: 5.0000 mg | ORAL_TABLET | Freq: Three times a day (TID) | ORAL | Status: DC | PRN

## 2019-12-24 MED ORDER — METOCLOPRAMIDE HCL 5 MG/ML IJ SOLN: 5.0000 mg | Freq: Three times a day (TID) | INTRAMUSCULAR | Status: DC | PRN

## 2019-12-24 MED ORDER — METHOCARBAMOL 1000 MG/10ML IJ SOLN
500.0000 mg | Freq: Four times a day (QID) | INTRAVENOUS | Status: DC | PRN
  Filled 2019-12-24: qty 5

## 2019-12-24 MED ORDER — ACETAMINOPHEN 325 MG PO TABS
325.0000 mg | ORAL_TABLET | Freq: Four times a day (QID) | ORAL | Status: DC | PRN
  Filled 2019-12-24: qty 2

## 2019-12-24 MED ORDER — PHENOL 1.4 % MT LIQD
1.0000 | OROMUCOSAL | Status: DC | PRN
  Filled 2019-12-24: qty 177

## 2019-12-24 SURGICAL SUPPLY — 63 items
BLADE SAW 18WX90L 1.27 THK (BLADE) ×3 IMPLANT
BLADE SAW SAG 25X90X1.19 (BLADE) ×3 IMPLANT
BOWL CEMENT MIX W/ADAPTER (MISCELLANEOUS) ×3 IMPLANT
BRUSH SCRUB EZ  4% CHG (MISCELLANEOUS)
BRUSH SCRUB EZ 4% CHG (MISCELLANEOUS) ×2 IMPLANT
CANISTER SUCT 1200ML W/VALVE (MISCELLANEOUS) ×3 IMPLANT
CANISTER SUCT 3000ML PPV (MISCELLANEOUS) ×6 IMPLANT
CEMENT BONE 1-PACK (Cement) ×6 IMPLANT
CHLORAPREP W/TINT 26 (MISCELLANEOUS) ×4 IMPLANT
COMP PATELLA FENESIS 32 OVAL (Stem) ×3 IMPLANT
COMPONENT FEM OXINIUM RT SZ5 (Knees) ×2 IMPLANT
COMPONENT PTLLA GENS 32 OVAL (Stem) IMPLANT
COMPONENT TIBIA RIGHT SZ 4 (Knees) IMPLANT
COOLER POLAR GLACIER W/PUMP (MISCELLANEOUS) ×3 IMPLANT
COVER WAND RF STERILE (DRAPES) ×3 IMPLANT
CUFF TOURN SGL QUICK 30 (TOURNIQUET CUFF) ×2
CUFF TRNQT CYL 30X4X21-28X (TOURNIQUET CUFF) ×1 IMPLANT
DRAPE 3/4 80X56 (DRAPES) ×6 IMPLANT
DRAPE INCISE IOBAN 66X60 STRL (DRAPES) ×3 IMPLANT
DRSG TEGADERM 4X4.75 (GAUZE/BANDAGES/DRESSINGS) ×2 IMPLANT
ELECT REM PT RETURN 9FT ADLT (ELECTROSURGICAL) ×3
ELECTRODE REM PT RTRN 9FT ADLT (ELECTROSURGICAL) ×1 IMPLANT
GAUZE SPONGE 4X4 12PLY STRL (GAUZE/BANDAGES/DRESSINGS) ×3 IMPLANT
GAUZE XEROFORM 1X8 LF (GAUZE/BANDAGES/DRESSINGS) ×3 IMPLANT
GLOVE BIO SURGEON STRL SZ7.5 (GLOVE) ×2 IMPLANT
GLOVE BIO SURGEON STRL SZ8 (GLOVE) ×3 IMPLANT
GLOVE BIOGEL PI IND STRL 8.5 (GLOVE) ×1 IMPLANT
GLOVE BIOGEL PI INDICATOR 8.5 (GLOVE) ×2
GLOVE INDICATOR 8.0 STRL GRN (GLOVE) ×3 IMPLANT
GLOVE SURG ORTHO 8.0 STRL STRW (GLOVE) ×9 IMPLANT
GOWN STRL REUS W/ TWL LRG LVL3 (GOWN DISPOSABLE) ×1 IMPLANT
GOWN STRL REUS W/ TWL XL LVL3 (GOWN DISPOSABLE) ×1 IMPLANT
GOWN STRL REUS W/TWL LRG LVL3 (GOWN DISPOSABLE) ×6
GOWN STRL REUS W/TWL XL LVL3 (GOWN DISPOSABLE) ×2
HOOD PEEL AWAY FLYTE STAYCOOL (MISCELLANEOUS) ×7 IMPLANT
INSERT ARTI HI FLEX 3-4 SZ10 (Insert) ×2 IMPLANT
IV NS 1000ML (IV SOLUTION) ×4
IV NS 1000ML BAXH (IV SOLUTION) ×1 IMPLANT
KIT TURNOVER KIT A (KITS) ×3 IMPLANT
MAT ABSORB  FLUID 56X50 GRAY (MISCELLANEOUS)
MAT ABSORB FLUID 56X50 GRAY (MISCELLANEOUS) ×1 IMPLANT
NDL SAFETY ECLIPSE 18X1.5 (NEEDLE) ×1 IMPLANT
NDL SPNL 20GX3.5 QUINCKE YW (NEEDLE) ×1 IMPLANT
NEEDLE HYPO 18GX1.5 SHARP (NEEDLE) ×2
NEEDLE SPNL 20GX3.5 QUINCKE YW (NEEDLE) ×3 IMPLANT
NS IRRIG 1000ML POUR BTL (IV SOLUTION) ×3 IMPLANT
PACK TOTAL KNEE (MISCELLANEOUS) ×3 IMPLANT
PAD DE MAYO PRESSURE PROTECT (MISCELLANEOUS) ×3 IMPLANT
PAD WRAPON POLAR KNEE (MISCELLANEOUS) ×1 IMPLANT
PULSAVAC PLUS IRRIG FAN TIP (DISPOSABLE) ×3
STAPLER SKIN PROX 35W (STAPLE) ×3 IMPLANT
SUCTION FRAZIER HANDLE 10FR (MISCELLANEOUS) ×2
SUCTION TUBE FRAZIER 10FR DISP (MISCELLANEOUS) ×1 IMPLANT
SUT DVC 2 QUILL PDO  T11 36X36 (SUTURE) ×2
SUT DVC 2 QUILL PDO T11 36X36 (SUTURE) ×1 IMPLANT
SUT VIC AB 2-0 CT1 18 (SUTURE) ×3 IMPLANT
SUT VIC AB 2-0 CT1 27 (SUTURE)
SUT VIC AB 2-0 CT1 TAPERPNT 27 (SUTURE) IMPLANT
SUT VIC AB PLUS 45CM 1-MO-4 (SUTURE) ×3 IMPLANT
SYR 30ML LL (SYRINGE) ×9 IMPLANT
TIBIA RIGHT SZ 4 (Knees) ×3 IMPLANT
TIP FAN IRRIG PULSAVAC PLUS (DISPOSABLE) ×1 IMPLANT
WRAPON POLAR PAD KNEE (MISCELLANEOUS) ×3

## 2019-12-24 NOTE — Transfer of Care (Cosign Needed)
Immediate Anesthesia Transfer of Care Note  Patient: PERMELIA BAMBA  Procedure(s) Performed: RIGHT TOTAL KNEE ARTHROPLASTY (Right Knee)  Patient Location: PACU  Anesthesia Type:Spinal  Level of Consciousness: drowsy  Airway & Oxygen Therapy: Patient Spontanous Breathing and Patient connected to face mask oxygen  Post-op Assessment: Report given to RN and Post -op Vital signs reviewed and stable  Post vital signs: Reviewed and stable  Last Vitals:  Vitals Value Taken Time  BP 140/82 12/24/19 0930  Temp 36 C 12/24/19 0930  Pulse 74 12/24/19 0933  Resp 14 12/24/19 0933  SpO2 100 % 12/24/19 0933  Vitals shown include unvalidated device data.  Last Pain:  Vitals:   12/24/19 0643  TempSrc: Temporal  PainSc: 6       Patients Stated Pain Goal: 2 (16/10/96 0454)  Complications: No apparent anesthesia complications

## 2019-12-24 NOTE — Anesthesia Procedure Notes (Signed)
Anesthesia Regional Block: Adductor canal block   Pre-Anesthetic Checklist: ,, timeout performed, Correct Patient, Correct Site, Correct Laterality, Correct Procedure, Correct Position, site marked, Risks and benefits discussed,  Surgical consent,  Pre-op evaluation,  At surgeon's request and post-op pain management  Laterality: Lower and Right  Prep: chloraprep       Needles:  Injection technique: Single-shot  Needle Type: Echogenic Needle     Needle Length: 9cm  Needle Gauge: 21     Additional Needles:   Procedures:,,,, ultrasound used (permanent image in chart),,,,  Narrative:  Start time: 12/24/2019 10:20 AM End time: 12/24/2019 10:26 AM Injection made incrementally with aspirations every 5 mL.  Events: blood aspirated,,,,,,,,,,  Performed by: Personally  Anesthesiologist: Arita Miss, MD  Additional Notes: Patient consented for risk and benefits of nerve block including but not limited to nerve damage, failed block, bleeding and infection.  Patient voiced understanding.  Functioning IV was confirmed and monitors were applied.  A echogenic needle was used. Sterile prep,hand hygiene and sterile gloves were used. Minimal sedation used for procedure.   No paresthesia endorsed by patient during the procedure.  Negative aspiration and negative test dose prior to incremental administration of local anesthetic. The patient tolerated the procedure well with no immediate complications.

## 2019-12-24 NOTE — TOC Initial Note (Signed)
Transition of Care [facility]) - Initial/Assessment Note    Patient Details  Name: Joanna Hall MRN: [id number] Date of Birth: 1965-12-24  Transition of Care [facility]) CM/SW Contact:    Elease Hashimoto, LCSW Phone Number: 12/24/2019, 3:02 PM  Clinical Narrative:    Met with pt to discuss discharge needs. She lives with her parents but was independent prior to admission They do help with transportation and are there for her. She has all needed equipment and follow up has been set up via [facility] through MD's office. Will see her in am when having therapies, she reports does not feel pain now and is doing well but knows it is coming once spinal block wears off. Will see in am and see how doing.               Expected Discharge Plan: [place] Barriers to Discharge: Continued Medical Work up   Patient Goals and CMS Choice Patient states their goals for this hospitalization and ongoing recovery are:: I hope to do well tomorrow I am not in pain now but have surgery meds.      Expected Discharge Plan and Services Expected Discharge Plan: [place] In-house Referral: Clinical Social Work   Post Acute Care Choice: [place] arrangements for the past 2 months: [place]: PT, OT [place] Agency: [facility] Date [place]: 12/24/19 Time HH Agency Contacted: 1500 Representative spoke with at [place]: [place] Arrangements/Services Living arrangements for the past 2 months: [place] with:: Parents              Current home services: DME(has rw and bsc no needs)    Activities of Daily Living Home Assistive Devices/Equipment: Cane (specify quad or straight), Dentures (specify type), Eyeglasses ADL Screening (condition at time of admission) Patient's cognitive ability adequate to safely complete daily activities?: Yes Is the patient deaf or have  difficulty hearing?: No Does the patient have difficulty seeing, even when wearing glasses/contacts?: No Does the patient have difficulty concentrating, remembering, or making decisions?: No Patient able to express need for assistance with ADLs?: Yes Does the patient have difficulty dressing or bathing?: No Independently performs ADLs?: Yes (appropriate for developmental age) Does the patient have difficulty walking or climbing stairs?: Yes Weakness of Legs: Right Weakness of Arms/Hands: Left  Permission Sought/Granted   Permission granted to share information with : Yes, Verbal Permission Granted  Share Information with NAME: [place]  Permission granted to share info w AGENCY: [facility]        Emotional Assessment Appearance:: Appears older than stated age Attitude/Demeanor/Rapport: Engaged Affect (typically observed): Adaptable, Accepting Orientation: : Oriented to Self, Oriented to Place, Oriented to  Time, Oriented to Situation      Admission diagnosis:  History of total knee arthroplasty, right [Z96.651] Patient Active Problem List   Diagnosis Date Noted  . History of total knee arthroplasty, right 12/24/2019  . Moderate episode of recurrent major depressive disorder ([place]) 11/13/2019  . Acute colitis 09/25/2019  . Anxiety and depression   . Thrombocytopenia ([place]) 08/17/2019  . Intractable lower abdominal pain   . Abdominal pain 07/23/2019  . Constipation 07/22/2019  . Malnutrition of moderate degree 06/22/2019  . Small bowel obstruction due to adhesions ([place]) 06/21/2019  .  Acute gastroenteritis 06/14/2019  . Intractable nausea and vomiting 06/07/2019  . Chest pain 05/19/2019  . Intractable pain 05/19/2019  . Cancer associated pain   . Proctitis, radiation   . Palliative care encounter   . Encounter for monitoring opioid maintenance therapy 11/19/2018  . Adrenal insufficiency ([place]) 09/03/2018  . Nausea vomiting and diarrhea   . Chronic diarrhea 06/25/2018  .  Chronic anticoagulation 06/25/2018  . Vomiting   . Encounter for antineoplastic chemotherapy 06/04/2018  . 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 Lung metastasis ([place]) 05/15/2018  . Closed fracture of distal end of radius 05/04/2018  . History of total knee arthroplasty 04/24/2018  . Osteoarthritis of left knee 02/28/2018  . Condyloma acuminatum of anus s/p ablation 02/22/2018 02/22/2018  . Condyloma acuminatum of vagina s/p ablation 02/22/2018 02/22/2018  . Positive ANA (antinuclear antibody) 02/04/2018  . Aortic atherosclerosis ([place]) 01/15/2018  . Elevated MCV 01/15/2018  . Anemia 01/15/2018  . Genital warts 01/15/2018  . Vitamin D deficiency 01/15/2018  . Pernicious anemia   . Impingement syndrome of shoulder region 12/19/2017  . Multiple joint pain 12/19/2017  . Osteoarthritis of right knee 12/19/2017  . B12 deficiency 12/11/2017  . Lung nodules 12/11/2017  . History of Clostridium difficile infection 10/23/2017  . Factor V Leiden ([place]) 10/19/2017  . Goals of care, counseling/discussion 10/19/2017  . Cervical cancer, FIGO stage IB1 ([place]) 09/18/2017  . Chronic hepatitis C without hepatic coma ([place]) 09/18/2017  . Cytopenia 09/18/2017  . Diarrhea 09/18/2017  . Elevated LFTs 09/18/2017  . Lower abdominal pain 09/18/2017  . 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 Malignant neoplasm of overlapping sites of cervix ([place]) 09/18/2017  . Renal insufficiency 09/18/2017  . Wound infection after surgery 09/12/2017  . Hypokalemia   . Hypomagnesemia   . Cervical arthritis 07/18/2017  . Dysuria 06/20/2017  . 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 Essential hypertension 03/15/2017  . Anemia of chronic disease 03/01/2017  . Chemotherapy-induced neutropenia ([place]) 01/29/2017  . 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[place]) 09/25/2016   PCP:  Towanda Malkin, MD Pharmacy:   [facility], [place] [address] Phone:  [phone] Fax: [phone]  [facility] [address] [place] [address] Phone: [phone] Fax: [phone]     Social Determinants of Health (SDOH) Interventions    Readmission Risk Interventions Readmission Risk Prevention Plan 09/26/2019 07/24/2019 06/25/2019  Transportation Screening Complete Complete Complete  Medication Review (RN Care Manager) Complete Referral to Pharmacy Complete  PCP or Specialist appointment within 3-5 days of discharge - Complete (No Data)  [place] or [place] - Complete -  SW Recovery Care/Counseling Consult - Complete -  [place] Not Applicable Not Applicable Not Applicable  Some recent data might be hidden

## 2019-12-24 NOTE — Anesthesia Procedure Notes (Signed)
Spinal  Patient location during procedure: OR Start time: 12/24/2019 7:38 AM Staffing Anesthesiologist: Arita Miss, MD Resident/CRNA: Janeann Forehand, RN Other anesthesia staff: Allean Found, CRNA Preanesthetic Checklist Completed: patient identified, IV checked, site marked, risks and benefits discussed, surgical consent, monitors and equipment checked, pre-op evaluation and timeout performed Spinal Block Patient position: sitting Prep: Betadine Patient monitoring: heart rate, continuous pulse ox, blood pressure and cardiac monitor Approach: midline Location: L4-5 Injection technique: single-shot Needle Needle type: Whitacre and Introducer  Needle gauge: 24 G Needle length: 9 cm Additional Notes Negative paresthesia. Negative blood return. Positive free-flowing CSF. Expiration date of kit checked and confirmed. Patient tolerated procedure well, without complications.

## 2019-12-24 NOTE — Op Note (Signed)
DATE OF SURGERY:  12/24/2019 TIME: 9:21 AM  PATIENT NAME:  Joanna Hall   AGE: 54 y.o.    PRE-OPERATIVE DIAGNOSIS:  M17.11 unilateral primary osteoarthritis, right knee  POST-OPERATIVE DIAGNOSIS:  Same  PROCEDURE:  Procedure(s): RIGHT TOTAL KNEE ARTHROPLASTY  SURGEON:  Lovell Sheehan, MD   ASSISTANT:  Carlynn Spry, PA-C  OPERATIVE IMPLANTS: Tamala Julian [employer] Nephew, Cruciate Retaining Oxinium Femoral component size  5, Fixed Bearing Tray size 4, Patella polyethylene 3-peg oval button size 32 mm, with a 10 mm HighFlex insert.   PREOPERATIVE INDICATIONS:  Joanna Hall is an 54 y.o. female who has a diagnosis of M17.11 unilateral primary osteoarthritis, right knee and elected for a right total knee arthroplasty after failing nonoperative treatment, including activity modification, pain medication, physical therapy and injections who has significant impairment of their activities of daily living.  Radiographs have demonstrated tricompartmental osteoarthritis joint space narrowing, osteophytes, subchondral sclerosis and cyst formation.  The risks, benefits, and alternatives were discussed at length including but not limited to the risks of infection, bleeding, nerve or blood vessel injury, knee stiffness, fracture, dislocation, loosening or failure of the hardware and the need for further surgery. Medical risks include but not limited to DVT and pulmonary embolism, myocardial infarction, stroke, pneumonia, respiratory failure and death. I discussed these risks with the patient in my office prior to the date of surgery. They understood these risks and were willing to proceed.  OPERATIVE FINDINGS AND UNIQUE ASPECTS OF THE CASE:  All three compartments with advanced and severe degenerative changes, large osteophytes and an abundance of synovial fluid. Significant deformity was also noted. A decision was made to proceed with total knee arthroplasty.   OPERATIVE DESCRIPTION:  The patient was  brought to the operative room and placed in a supine position after undergoing placement of a general anesthetic. IV antibiotics were given. Patient received tranexamic acid. The lower extremity was prepped and draped in the usual sterile fashion.  A time out was performed to verify the patient's name, date of birth, medical record number, correct site of surgery and correct procedure to be performed. The timeout was also used to confirm the patient received antibiotics and that appropriate instruments, implants and radiographs studies were available in the room.  The leg was elevated and exsanguinated with an Esmarch and the tourniquet was inflated to 250 mmHg.  A midline incision was made over the left knee.. A medial parapatellar arthrotomy was then made and the patella subluxed laterally and the knee was brought into 90 of flexion. Hoffa's fat pad along with the anterior cruciate ligament was resected and the medial joint line was exposed.  Attention was then turned to preparation of the patella. The thickness of the patella was measured with a caliper, the diameter measured with the patella templates.  The patella resection was then made with an oscillating saw using the patella cutting guide.  The 32 mm button fit appropriately.  3 peg holes for the patella component were then drilled.  The extramedullary tibial cutting guide was then placed using the anterior tibial crest and second ray of the foot as a reference.  The tibial cutting guide was adjusted to allow for appropriate posterior slope.  The tibial cutting block was pinned into position. The slotted stylus was used to measure the proximal tibial resection of 9 mm off the high lateral side. Care was taken during the tibial resection to protect the medial and collateral ligaments.  The resected tibial bone was removed.  The distal femur was resected using the Visionaire cutting guide.  Care was taken to protect the collateral ligaments during  distal femoral resection.  The distal femoral resection was performed with an oscillating saw. The femoral cutting guide was then removed. Extension gap was measured with a 10 mm spacer block and alignment and extension was confirmed using a long alignment rod. The femur was sized to be a 5. Rotation of the referencing guide was checked with the epicondylar axis and Whitesides line. Then the 4-in-1 cutting jig was then applied to the distal femur. A stylus was used to confirm that the anterior femur would not be notched.   Then the anterior, posterior and chamfer femoral cuts were then made with an oscillating saw.  The knee was distracted and all posterior osteophytes were removed.  The flexion gap was then measured with a flexion spacer block and long alignment rod and was found to be symmetric with the extension gap and perpendicular to mechanical axis of the tibia.  The proximal tibia plateau was then sized with trial trays. The best coverage was achieved with a size 4. This tibial tray was then pinned into position. The proximal tibia was then prepared with the keel punch.  After tibial preparation was completed, all trial components were inserted with polyethylene trials. The knee achieved full extension and flexed to 120 degrees. Ligament were stable to varus and valgus at full extension as well as 30, 60 and 90 degrees of flexion.   The trials were then placed. Knee was taken through a full range of motion and deemed to be stable with the trial components. All trial components were then removed.  The joint was copiously irrigated with pulse lavage.  The final total knee arthroplasty components were then cemented into place. The knee was held in extension while cement was allowed to cure.The knee was taken through a range of motion and the patella tracked well and the knee was again irrigated copiously.  The knee capsule was then injected with Exparel.  The medial arthrotomy was closed with #1 Vicryl  and #2 Quill. The subcutaneous tissue closed with  2-0 vicryl, and skin approximated with staples.  A dry sterile and compressive dressing was applied.  A Polar Care was applied to the operative knee.  The patient was awakened and brought to the PACU in stable and satisfactory condition.  All sharp, lap and instrument counts were correct at the conclusion the case. I spoke with the patient's family in the postop consultation room to let them know the case had been performed without complication and the patient was stable in recovery room.   Total tourniquet time was 54 minutes.

## 2019-12-24 NOTE — H&P (Signed)
The patient has been re-examined, and the chart reviewed, and there have been no interval changes to the documented history and physical.  Plan a right total knee today.  Anesthesia is consulted regarding a peripheral nerve block for post-operative pain.  The risks, benefits, and alternatives have been discussed at length, and the patient is willing to proceed.

## 2019-12-24 NOTE — Anesthesia Preprocedure Evaluation (Addendum)
Anesthesia Evaluation  Patient identified by MRN, date of birth, ID band Patient awake    Reviewed: Allergy & Precautions, NPO status , Patient's Chart, lab work & pertinent test results  History of Anesthesia Complications Negative for: history of anesthetic complications  Airway Mallampati: II  TM Distance: >3 FB Neck ROM: Full    Dental  (+) Edentulous Upper, Edentulous Lower   Pulmonary neg pulmonary ROS, shortness of breath and with exertion, neg sleep apnea, neg COPD, Patient abstained from smoking.Not current smoker, former smoker,  Lung CA s/p chemo/XRT and surgery   Pulmonary exam normal breath sounds clear to auscultation       Cardiovascular Exercise Tolerance: Good METShypertension, (-) CAD and (-) Past MI negative cardio ROS  (-) dysrhythmias  Rhythm:Regular Rate:Normal - Systolic murmurs    Neuro/Psych PSYCHIATRIC DISORDERS Anxiety Depression negative neurological ROS  negative psych ROS   GI/Hepatic GERD  Controlled,(+)     (-) substance abuse  , Hepatitis -, C  Endo/Other  neg diabetes  Renal/GU CRFRenal diseasenegative Renal ROS     Musculoskeletal   Abdominal   Peds  Hematology  (+) anemia , Takes xarelto for renal thrombi, last taken 4 days prior. In the interim has been taking lovenox 152m daily. Last taken yesterday morning.    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 No date: Pneumonia     Comment:  History of No date: Pulmonary nodules No date: Rectal bleeding 08/2017: Small bowel obstruction (HCC) No date: Stiff neck     Comment:  limited right turn No date: Vitamin D deficiency  Reproductive/Obstetrics                             Anesthesia Physical Anesthesia Plan  ASA: III  Anesthesia Plan: General/Spinal   Post-op Pain Management:  Regional for Post-op pain   Induction:   PONV Risk Score and Plan: 4 or greater and Ondansetron, Dexamethasone, Propofol infusion and Midazolam  Airway Management Planned: Natural Airway  Additional Equipment:   Intra-op Plan:   Post-operative Plan:   Informed Consent: I have reviewed the patients History and Physical, chart, labs and discussed the procedure  including the risks, benefits and alternatives for the proposed anesthesia with the patient or authorized representative who has indicated his/her understanding and acceptance.       Plan Discussed with: CRNA and Surgeon  Anesthesia Plan Comments: (Discussed R/B/A of neuraxial anesthesia technique  with patient: - rare risks of spinal/epidural hematoma, nerve damage, infection (per ASRA guidelines, ok to proceed with neuraxial given 24 hrs since last lovenox and more than 3 days since last xarelto) - Risk of PDPH - Risk of nausea and vomiting - Risk of conversion to general anesthesia and its associated risks, including sore throat, damage to lips/teeth/oropharynx, and rare risks such as cardiac and respiratory events.  Discussed adductor canal block for post op pain management, including risks such as bleeding, infection, damage to surrounding nerves and vascular structures, poor/nonfunctioning block. Patient is agreeable.  Patient voiced understanding.)       Anesthesia Quick Evaluation  Preoperative evaluation completed prior to anesthesia start, but entered in Epic afterward.

## 2019-12-24 NOTE — Anesthesia Procedure Notes (Signed)
Procedure Name: MAC Date/Time: 12/24/2019 7:45 AM Performed by: Allean Found, CRNA Pre-anesthesia Checklist: Patient identified, Emergency Drugs available, Suction available, Patient being monitored and Timeout performed Patient Re-evaluated:Patient Re-evaluated prior to induction Oxygen Delivery Method: Nasal cannula Placement Confirmation: positive ETCO2

## 2019-12-24 NOTE — Anesthesia Postprocedure Evaluation (Signed)
Anesthesia Post Note  Patient: Joanna Hall  Procedure(s) Performed: RIGHT TOTAL KNEE ARTHROPLASTY (Right Knee)  Patient location during evaluation: PACU Anesthesia Type: Combined General/Spinal Level of consciousness: oriented and awake and alert Pain management: pain level controlled Vital Signs Assessment: post-procedure vital signs reviewed and stable Respiratory status: spontaneous breathing, respiratory function stable and patient connected to nasal cannula oxygen Cardiovascular status: blood pressure returned to baseline and stable Postop Assessment: no headache, no backache and no apparent nausea or vomiting Anesthetic complications: no     Last Vitals:  Vitals:   12/24/19 1045 12/24/19 1050  BP: (!) 149/85 (!) 150/84  Pulse: 76 77  Resp: 16 13  Temp:    SpO2: 98% 99%    Last Pain:  Vitals:   12/24/19 1035  TempSrc:   PainSc: 0-No pain                 Arita Miss

## 2019-12-24 NOTE — Evaluation (Signed)
Physical Therapy Evaluation Patient Details Name: Joanna Hall MRN: [id number] DOB: 1965/12/01 Today's Date: 12/24/2019   History of Present Illness  Pt is a 54 yo female s/p RTKA, WBAT. 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    Clinical Impression  Pt alert, oriented, initially reported decreased light touch sensation but about to ankle DF/PF, quad set. Pt reported at baseline she is independent, lives with her parents who perform the cooking/cleaning, planning on staying on the first floor after discharge.  The patient was able to perform supine exercises with tactile/verbal cues, did report increased R knee pain (from 0/10) with knee flexion exercises. Supine to sit with supervision, and new gown donned due to pt urination in bed. Sit <> stand with RW and CGA, pt did exhibit some unsteadiness. She ambulated ~70f in the room with RW and CGA. Pt performed step to gait pattern, unsteadiness noted with turning, cued for proper sequencing to increase safety with fair follow through. Pt in recliner, all needs in reach after mobility. Overall the patient demonstrated deficits (see "PT Problem List") that impede the patient's functional abilities, safety, and mobility and would benefit from skilled PT intervention. Recommendation is HHPT.      Follow Up Recommendations Home health PT    Equipment Recommendations  None recommended by PT    Recommendations for Other Services       Precautions / Restrictions Precautions Precautions: Fall;Knee Precaution Booklet Issued: Yes (comment) Restrictions Weight Bearing Restrictions: Yes RLE Weight Bearing: Weight bearing as tolerated      Mobility  Bed Mobility Overal bed mobility: Needs Assistance Bed Mobility: Supine to Sit     Supine to sit: Modified independent (Device/Increase time)        Transfers Overall transfer  level: Needs assistance Equipment used: Rolling walker (2 wheeled) Transfers: Sit to/from Stand Sit to Stand: Min guard         General transfer comment: some unsteadiness noted  Ambulation/Gait Ambulation/Gait assistance: Min guard Gait Distance (Feet): 10 Feet Assistive device: Rolling walker (2 wheeled) Gait Pattern/deviations: Step-to pattern     General Gait Details: antalgic, quick unsafe turns, pt instructed in slower, pivot turns for safety  Stairs            Wheelchair Mobility    Modified Rankin (Stroke Patients Only)       Balance Overall balance assessment: Needs assistance Sitting-balance support: Feet supported Sitting balance-Leahy Scale: Good       Standing balance-Leahy Scale: Fair                               Pertinent Vitals/Pain Pain Assessment: Faces(reported no pain at rest) Faces Pain Scale: Hurts a little bit Pain Location: with knee flexion Pain Descriptors / Indicators: Grimacing;Moaning Pain Intervention(s): Limited activity within patient's tolerance;Monitored during session;Ice applied;Repositioned    Home Living Family/patient expects to be discharged to:: Private residence Living Arrangements: Parent Available Help at Discharge: Available 24 hours/day Type of Home: House Home Access: Stairs to enter   [employer] Steps: 1 step Home Layout: Two level;Able to live on main level with bedroom/bathroom Home Equipment: Grab bars - tub/shower;Bedside commode;Walker - 2 wheels      Prior Function Level of Independence: Independent         Comments: parents help her out as needed, they perform a lot of  the cooking/cleaning     Hand Dominance        Extremity/Trunk Assessment   Upper Extremity Assessment Upper Extremity Assessment: Overall WFL for tasks assessed    Lower Extremity Assessment Lower Extremity Assessment: RLE deficits/detail;LLE deficits/detail RLE Deficits / Details: s/p  R TKA LLE Deficits / Details: WFLs, previous L TKA    Cervical / Trunk Assessment Cervical / Trunk Assessment: Normal  Communication   Communication: No difficulties  Cognition Arousal/Alertness: Awake/alert Behavior During Therapy: WFL for tasks assessed/performed Overall Cognitive Status: Within Functional Limits for tasks assessed                                        General Comments      Exercises Total Joint Exercises Ankle Circles/Pumps: AROM;Strengthening;Both;10 reps Quad Sets: AROM;Strengthening;Both;10 reps Heel Slides: AAROM;Strengthening;Right;10 reps Straight Leg Raises: AROM;Strengthening;Right;10 reps Knee Flexion: AROM;Strengthening;Right;10 reps Goniometric ROM: 0 - 65 deg   Assessment/Plan    PT Assessment Patient needs continued PT services  PT Problem List Decreased mobility;Decreased strength;Decreased knowledge of precautions;Decreased activity tolerance;Decreased balance;Pain;Decreased knowledge of use of DME       PT Treatment Interventions DME instruction;Therapeutic exercise;Gait training;Balance training;Stair training;Neuromuscular re-education;Functional mobility training;Therapeutic activities;Patient/family education    PT Goals (Current goals can be found in the Care Plan section)  Acute Rehab PT Goals Patient Stated Goal: to return to PLOF PT Goal Formulation: With patient Time For Goal Achievement: 01/07/20 Potential to Achieve Goals: Good    Frequency BID   Barriers to discharge        Co-evaluation               AM-PAC PT "6 Clicks" Mobility  Outcome Measure Help needed turning from your back to your side while in a flat bed without using bedrails?: A Little Help needed moving from lying on your back to sitting on the side of a flat bed without using bedrails?: A Little Help needed moving to and from a bed to a chair (including a wheelchair)?: A Little Help needed standing up from a chair using your  arms (e.g., wheelchair or bedside chair)?: A Little Help needed to walk in hospital room?: A Little Help needed climbing 3-5 steps with a railing? : A Little 6 Click Score: 18    End of Session Equipment Utilized During Treatment: Gait belt Activity Tolerance: Patient tolerated treatment well Patient left: in chair;with chair alarm set;with nursing/sitter in room;with call bell/phone within reach;with SCD's reapplied(polarcare in place) Nurse Communication: Mobility status PT Visit Diagnosis: Other abnormalities of gait and mobility (R26.89);Muscle weakness (generalized) (M62.81);Pain;Difficulty in walking, not elsewhere classified (R26.2) Pain - Right/Left: Right Pain - part of body: Knee    Time: 6144-3154 PT Time Calculation (min) (ACUTE ONLY): 24 min   Charges:   PT Evaluation $PT Eval Moderate Complexity: 1 Mod PT Treatments $Therapeutic Exercise: 8-22 mins       Lieutenant Diego PT, DPT 3:55 PM,12/24/19

## 2019-12-25 DIAGNOSIS — M1711 Unilateral primary osteoarthritis, right knee: Secondary | ICD-10-CM | POA: Diagnosis not present

## 2019-12-25 LAB — CBC
HCT: 25.6 % — ABNORMAL LOW (ref 36.0–46.0)
Hemoglobin: 8.6 g/dL — ABNORMAL LOW (ref 12.0–15.0)
MCH: 33.3 pg (ref 26.0–34.0)
MCHC: 33.6 g/dL (ref 30.0–36.0)
MCV: 99.2 fL (ref 80.0–100.0)
Platelets: 163 10*3/uL (ref 150–400)
RBC: 2.58 MIL/uL — ABNORMAL LOW (ref 3.87–5.11)
RDW: 12.4 % (ref 11.5–15.5)
WBC: 5.6 10*3/uL (ref 4.0–10.5)
nRBC: 0 % (ref 0.0–0.2)

## 2019-12-25 LAB — BASIC METABOLIC PANEL
Anion gap: 6 (ref 5–15)
BUN: 12 mg/dL (ref 6–20)
CO2: 25 mmol/L (ref 22–32)
Calcium: 8.3 mg/dL — ABNORMAL LOW (ref 8.9–10.3)
Chloride: 104 mmol/L (ref 98–111)
Creatinine, Ser: 1.16 mg/dL — ABNORMAL HIGH (ref 0.44–1.00)
GFR calc Af Amer: >60 mL/min (ref >60)
GFR calc non Af Amer: 54 mL/min — ABNORMAL LOW (ref >60)
Glucose, Bld: 145 mg/dL — ABNORMAL HIGH (ref 70–99)
Potassium: 4.1 mmol/L (ref 3.5–5.1)
Sodium: 135 mmol/L (ref 135–145)

## 2019-12-25 MED ORDER — OXYCODONE HCL 10 MG PO TABS: 10.0000 mg | ORAL_TABLET | ORAL | 0 refills | Status: DC | PRN

## 2019-12-25 MED ORDER — METHOCARBAMOL 500 MG PO TABS: 500.0000 mg | ORAL_TABLET | Freq: Four times a day (QID) | ORAL | 2 refills | Status: DC | PRN

## 2019-12-25 MED ORDER — DOCUSATE SODIUM 100 MG PO CAPS: 100.0000 mg | ORAL_CAPSULE | Freq: Two times a day (BID) | ORAL | 0 refills | Status: DC

## 2019-12-25 NOTE — Progress Notes (Signed)
Subjective:  Patient reports pain as mild to moderate.  Doing well this am  Objective:   VITALS:   Vitals:   12/24/19 1543 12/24/19 2028 12/25/19 0010 12/25/19 0414  BP: (!) 128/96 (!) 178/94 (!) 141/98 (!) 139/92  Pulse: 89 88 99 85  Resp: 17   18  Temp: 98.3 F (36.8 C) 97.9 F (36.6 C) 98 F (36.7 C) 98.2 F (36.8 C)  TempSrc: Oral Oral Oral Oral  SpO2: 100% 100% 99% 100%  Weight:      Height:        PHYSICAL EXAM:  Neurologically intact ABD soft Neurovascular intact Sensation intact distally Intact pulses distally Dorsiflexion/Plantar flexion intact Incision: scant drainage No cellulitis present Compartment soft dressing changed  LABS  Results for orders placed or performed during the hospital encounter of 12/24/19 (from the past 24 hour(s))  Type and screen [facility]     Status: None   Collection Time: 12/24/19  6:52 AM  Result Value Ref Range   ABO/RH(D) O POS    Antibody Screen NEG    Sample Expiration      12/27/2019,2359 Performed at [facility], [address] [place], [place] [address]   CBC     Status: Abnormal   Collection Time: 12/25/19  4:05 AM  Result Value Ref Range   WBC 5.6 4.0 - 10.5 K/uL   RBC 2.58 (L) 3.87 - 5.11 MIL/uL   Hemoglobin 8.6 (L) 12.0 - 15.0 g/dL   HCT 25.6 (L) 36.0 - 46.0 %   MCV 99.2 80.0 - 100.0 fL   MCH 33.3 26.0 - 34.0 pg   MCHC 33.6 30.0 - 36.0 g/dL   RDW 12.4 11.5 - 15.5 %   Platelets 163 150 - 400 K/uL   nRBC 0.0 0.0 - 0.2 %  Basic metabolic panel     Status: Abnormal   Collection Time: 12/25/19  4:05 AM  Result Value Ref Range   Sodium 135 135 - 145 mmol/L   Potassium 4.1 3.5 - 5.1 mmol/L   Chloride 104 98 - 111 mmol/L   CO2 25 22 - 32 mmol/L   Glucose, Bld 145 (H) 70 - 99 mg/dL   BUN 12 6 - 20 mg/dL   Creatinine, Ser 1.16 (H) 0.44 - 1.00 mg/dL   Calcium 8.3 (L) 8.9 - 10.3 mg/dL   GFR calc non Af Amer 54 (L) >60 mL/min   GFR calc Af Amer >60 >60 mL/min   Anion gap 6  5 - 15   *Note: Due to a large number of results and/or encounters for the requested time period, some results have not been displayed. A complete set of results can be found in Results Review.    DG Knee 1-2 Views Right  Result Date: 12/24/2019 CLINICAL DATA:  Right total knee replacement EXAM: RIGHT KNEE - 1-2 VIEW COMPARISON:  None. FINDINGS: Changes of right total knee replacement. No hardware or bony complicating feature. Soft tissue and joint space gas noted. IMPRESSION: Right knee replacement.  No visible complicating feature. Electronically Signed   By: Rolm Baptise M.D.   On: 12/24/2019 10:09   [place] OR NERVE BLOCK-IMAGE ONLY [facility])  Result Date: 12/24/2019 There is no interpretation for this exam.  This order is for images obtained during a surgical procedure.  Please See "Surgeries" Tab for more information regarding the procedure.    Assessment/Plan: 1 Day Post-Op   Active Problems:   History of total knee arthroplasty, right  Advance diet Up with therapy Discharge home with home health if PT goals met today Follow up in 2 weeks with Dr. [name]. Call for appointment [phone]    Carlynn Spry , PA-C 12/25/2019, 6:42 AM

## 2019-12-25 NOTE — Progress Notes (Signed)
Discharge instructions reviewed with patient. Joanna Hall verbalized understanding. IV removed. Patient states that she will call and make her own appointment with Dr. [name] due to office being on lunch until 1:45 per secretary. She is now being wheeled out by NT.

## 2019-12-25 NOTE — Discharge Summary (Signed)
Physician Discharge Summary  Patient ID: Joanna Hall MRN: [id number] DOB/AGE: 1966/07/31 54 y.o.  Admit date: 12/24/2019 Discharge date: 12/25/2019  Admission Diagnoses:  M17.11 unilateral primary osteoarthritis, right knee <principal problem not specified>  Discharge Diagnoses:  M17.11 unilateral primary osteoarthritis, right knee Active Problems:   History of total knee arthroplasty, right   Past Medical History:  Diagnosis Date  . Abdominal pain 06/10/2018  . Abnormal cervical Papanicolaou smear 09/18/2017  . Anxiety   . Aortic atherosclerosis ([place])   . Arthritis    neck and knees  . 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 Blood transfusion without reported diagnosis   . Cervical cancer ([place]) 09/2016   mets lung  . Chronic anal fissure   . Chronic diarrhea   . Dyspnea   . Erosive gastropathy 09/18/2017  . Factor V Leiden mutation ([place])   . Fecal incontinence   . Genital warts   . GERD (gastroesophageal reflux disease)   . GI bleed 10/08/2018  . Heart murmur   . Hematochezia   . Hemorrhoids   . 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 Hepatitis, chronic ([place]) 05/05/2017  . History of cancer chemotherapy    completed 06/2017  . History of Clostridium difficile infection    while undergoing chemo.  Negative test 10/2017  . Ileocolic anastomotic leak   . 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 Intestinal infection due to Clostridium difficile 09/18/2017  . Macrocytic anemia with vitamin B12 deficiency   . Multiple gastric ulcers   . Nausea vomiting and diarrhea   . Pancolitis ([place]) 07/27/2018  . Perianal condylomata   . Pneumonia    History of  . Pulmonary nodules   . Rectal bleeding   . Small bowel obstruction ([place]) 08/2017  . Stiff neck    limited right turn  . Vitamin D deficiency     Surgeries: Procedure(s): RIGHT TOTAL KNEE ARTHROPLASTY on 12/24/2019   Consultants (if any): Treatment Team:   Lovell Sheehan, MD  Discharged Condition: Improved  Hospital Course: ASANI DENISTON is an 54 y.o. female who was admitted 12/24/2019 with a diagnosis of  M17.11 unilateral primary osteoarthritis, right knee <principal problem not specified> and went to the operating room on 12/24/2019 and underwent the above named procedures.    She was given perioperative antibiotics:  Anti-infectives (From admission, onward)   Start     Dose/Rate Route Frequency Ordered Stop   12/24/19 1400  ceFAZolin (ANCEF) IVPB 2g/100 mL premix     2 g 200 mL/hr over 30 Minutes Intravenous Every 6 hours 12/24/19 1108 12/25/19 0625   12/24/19 0812  50,000 units bacitracin in 0.9% normal saline 250 mL irrigation  Status:  Discontinued       As needed 12/24/19 0812 12/24/19 0924   12/24/19 0601  ceFAZolin (ANCEF) 2-4 GM/100ML-% IVPB    Note to Pharmacy: Register, Karen   : cabinet override      12/24/19 0601 12/24/19 0752   12/24/19 0600  ceFAZolin (ANCEF) IVPB 2g/100 mL premix     2 g 200 mL/hr over 30 Minutes Intravenous On call to O.R. 12/24/19 4098 12/24/19 0747    .  She was given sequential compression devices, early ambulation, and Xarelto for DVT prophylaxis.  She benefited maximally from the hospital stay and there were no complications.    Recent vital signs:  Vitals:  12/25/19 0010 12/25/19 0414  BP: (!) 141/98 (!) 139/92  Pulse: 99 85  Resp:  18  Temp: 98 F (36.7 C) 98.2 F (36.8 C)  SpO2: 99% 100%    Recent laboratory studies:  Lab Results  Component Value Date   HGB 8.6 (L) 12/25/2019   HGB 13.6 12/22/2019   HGB 12.5 12/11/2019   Lab Results  Component Value Date   WBC 5.6 12/25/2019   PLT 163 12/25/2019   Lab Results  Component Value Date   INR 1.0 12/22/2019   Lab Results  Component Value Date   NA 135 12/25/2019   K 4.1 12/25/2019   CL 104 12/25/2019   CO2 25 12/25/2019   BUN 12 12/25/2019   CREATININE 1.16 (H) 12/25/2019   GLUCOSE 145 (H) 12/25/2019     Discharge Medications:   Allergies as of 12/25/2019      Reactions   Ketamine Anxiety, Other (See Comments)   Syncope episode/confusion      Medication List    TAKE these medications   amitriptyline 100 MG tablet Commonly known as: ELAVIL Take 1 tablet (100 mg total) by mouth at bedtime.   Calcium 500 +D 500-400 MG-UNIT Tabs Generic drug: Calcium Carb-Cholecalciferol Take 1 tablet by mouth daily.   colchicine 0.6 MG tablet Take 0.6 mg by mouth daily.   COLD RELIEF PO Take 2 tablets by mouth daily as needed (cold symptoms).   cyanocobalamin 1000 MCG/ML injection Commonly known as: (VITAMIN B-12) Inject 1,000 mcg into the muscle every 21 ( twenty-one) days.   dexamethasone 4 MG tablet Commonly known as: DECADRON Take 4 mg by mouth See admin instructions. Take 4 mg twice daily for 3 days starting 3 days prior to chemo   diphenoxylate-atropine 2.5-0.025 MG tablet Commonly known as: LOMOTIL Take 2 tablets by mouth 2 (two) times daily as needed for diarrhea or loose stools.   docusate sodium 100 MG capsule Commonly known as: COLACE Take 1 capsule (100 mg total) by mouth 2 (two) times daily.   enoxaparin 120 MG/0.8ML injection Commonly known as: Lovenox Inject 0.8 mLs (120 mg total) into the skin daily.   famotidine 20 MG tablet Commonly known as: PEPCID Take 1 tablet (20 mg total) by mouth 2 (two) times daily.   folic acid 1 MG tablet Commonly known as: FOLVITE Take 1 tablet (1 mg total) by mouth daily.   hydrocortisone 10 MG tablet Commonly known as: CORTEF Take 1 tablet (10 mg total) by mouth 2 (two) times daily.   hydrOXYzine 25 MG capsule Commonly known as: VISTARIL Take 1 capsule (25 mg total) by mouth 3 (three) times daily as needed for itching.   hyoscyamine 0.375 MG 12 hr tablet Commonly known as: LEVBID Take 1 tablet (0.375 mg total) by mouth 2 (two) times daily.   methocarbamol 500 MG tablet Commonly known as: ROBAXIN Take 1 tablet (500 mg  total) by mouth every 6 (six) hours as needed for muscle spasms.   ondansetron 4 MG disintegrating tablet Commonly known as: Zofran ODT Take 1 tablet (4 mg total) by mouth every 8 (eight) hours as needed for nausea or vomiting.   oxyCODONE 20 mg 12 hr tablet Commonly known as: OXYCONTIN Take 1 tablet (20 mg total) by mouth every 8 (eight) hours. What changed:   Another medication with the same name was added. Make sure you understand how and when to take each.  Another medication with the same name was changed. Make sure you understand how and  when to take each.   Oxycodone HCl 10 MG Tabs Take 1-2 tablets (10-20 mg total) by mouth every 6 (six) hours as needed (for breakthrough pain). What changed: how much to take   Oxycodone HCl 10 MG Tabs Take 1 tablet (10 mg total) by mouth every 4 (four) hours as needed (for breakthrough pain). What changed: You were already taking a medication with the same name, and this prescription was added. Make sure you understand how and when to take each.   pantoprazole 40 MG tablet Commonly known as: PROTONIX Take 1 tablet (40 mg total) by mouth daily.   potassium chloride SA 20 MEQ tablet Commonly known as: KLOR-CON Take 1 tablet (20 mEq total) by mouth 2 (two) times daily.   promethazine 25 MG tablet Commonly known as: PHENERGAN Take 1 tablet (25 mg total) by mouth every 8 (eight) hours as needed for nausea or vomiting.   rivaroxaban 20 MG Tabs tablet Commonly known as: XARELTO Take 1 tablet (20 mg total) by mouth every morning. Takes in the morning   sertraline 25 MG tablet Commonly known as: ZOLOFT Take 1 tablet (25 mg total) by mouth daily.   Super Thera Vite M Tabs Take 1 tablet by mouth daily.   triamcinolone ointment 0.5 % Commonly known as: KENALOG Apply 1 application topically 2 (two) times daily. What changed:   when to take this  reasons to take this   Ventolin HFA 108 (90 Base) MCG/ACT inhaler Generic drug:  albuterol Inhale 1-2 puffs into the lungs every 4 (four) hours as needed for shortness of breath.       Diagnostic Studies: DG Chest 2 View  Result Date: 12/11/2019 CLINICAL DATA:  Cough.  Cervical carcinoma EXAM: CHEST - 2 VIEW COMPARISON:  October 18, 2019 chest radiograph and chest CT October 06, 2019 FINDINGS: There is atelectatic change in the left base. There are scattered subcentimeter nodular lesions consistent with metastatic disease. Heart size and pulmonary vascular normal. No adenopathy. Port-A-Cath tip is in the superior vena cava. No pneumothorax. No bone lesions IMPRESSION: Atelectatic change lateral left base with questionable earliest changes of pneumonia. Multiple subcentimeter nodular lesions consistent with metastatic foci. No adenopathy. Heart size normal. Port-A-Cath tip in superior vena cava. Electronically Signed   By: Lowella Grip III M.D.   On: 12/11/2019 10:41   DG Knee 1-2 Views Right  Result Date: 12/24/2019 CLINICAL DATA:  Right total knee replacement EXAM: RIGHT KNEE - 1-2 VIEW COMPARISON:  None. FINDINGS: Changes of right total knee replacement. No hardware or bony complicating feature. Soft tissue and joint space gas noted. IMPRESSION: Right knee replacement.  No visible complicating feature. Electronically Signed   By: Rolm Baptise M.D.   On: 12/24/2019 10:09   [place] OR NERVE BLOCK-IMAGE ONLY [facility])  Result Date: 12/24/2019 There is no interpretation for this exam.  This order is for images obtained during a surgical procedure.  Please See "Surgeries" Tab for more information regarding the procedure.    Disposition: Discharge disposition: 01-Home or Self Care            Signed: Carlynn Spry ,PA-C 12/25/2019, 6:49 AM

## 2019-12-25 NOTE — Discharge Instructions (Signed)
Continue weight bear as tolerated on the right lower extremity.    Elevate the right lower extremity whenever possible and continue the polar care while elevating the extremity. Patient may shower. No bath or submerging the wound.    Continue to take Xarelto as directed for blood clot prevention.  Continue to work on knee range of motion exercises at home as instructed by physical therapy. Continue to use a walker for assistance with ambulation until cleared by physical therapy.  Call [phone] with any questions, such as fever > 101.5 degrees, drainage from the wound or shortness of breath.

## 2019-12-25 NOTE — Progress Notes (Signed)
Physical Therapy Treatment Patient Details Name: Joanna Hall MRN: [id number] DOB: 1966-03-03 Today's Date: 12/25/2019    History of Present Illness Pt is a 54 yo female s/p RTKA, WBAT. 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    PT Comments    Patient received in bed, talkative, ready to go home. Patient is independent with bed mobility. Transfers with supervision. Ambulated 200 feet with RW, cues for upright posture. Ambulated up/down 4 steps x 2 reps supervision with cues for proper sequencing. Patient is making excellent progress and will continue to benefit from skilled PT while here to improve independce with mobility.        Follow Up Recommendations  Home health PT     Equipment Recommendations  None recommended by PT    Recommendations for Other Services       Precautions / Restrictions Precautions Precautions: Fall;Knee Restrictions Weight Bearing Restrictions: Yes RLE Weight Bearing: Weight bearing as tolerated    Mobility  Bed Mobility Overal bed mobility: Independent Bed Mobility: Supine to Sit     Supine to sit: Independent        Transfers Overall transfer level: Needs assistance Equipment used: Rolling walker (2 wheeled) Transfers: Sit to/from Stand Sit to Stand: Supervision            Ambulation/Gait Ambulation/Gait assistance: Supervision Gait Distance (Feet): 200 Feet Assistive device: Rolling walker (2 wheeled) Gait Pattern/deviations: Decreased stride length;Step-through pattern Gait velocity: decreased   General Gait Details: slightly impulsive   Stairs Stairs: Yes Stairs assistance: Supervision Stair Management: Two rails;Step to pattern Number of Stairs: 4 General stair comments: performed 2 times with cues for correct sequencing   Wheelchair Mobility    Modified Rankin (Stroke Patients Only)       Balance  Overall balance assessment: Needs assistance Sitting-balance support: Feet supported Sitting balance-Leahy Scale: Normal     Standing balance support: Bilateral upper extremity supported;During functional activity Standing balance-Leahy Scale: Good Standing balance comment: at times will take a few steps without RW. Good balance                            Cognition Arousal/Alertness: Awake/alert Behavior During Therapy: WFL for tasks assessed/performed Overall Cognitive Status: Within Functional Limits for tasks assessed                                        Exercises Total Joint Exercises Ankle Circles/Pumps: AROM;10 reps;Both Quad Sets: AROM;10 reps;Both Heel Slides: AROM;10 reps;Right Hip ABduction/ADduction: AROM;10 reps;Right Straight Leg Raises: AROM;10 reps;Right Knee Flexion: AROM;5 reps;Right Goniometric ROM: 0-95    General Comments        Pertinent Vitals/Pain Pain Assessment: 0-10 Pain Score: 7  Pain Location: behind knee Pain Descriptors / Indicators: Sore Pain Intervention(s): Monitored during session    Home Living                      Prior Function            PT Goals (current goals can now be found in the care plan section) Acute Rehab PT Goals Patient Stated Goal: to return to PLOF PT Goal Formulation: With patient Time For Goal Achievement: 01/07/20 Potential to Achieve Goals: Good Progress towards PT goals: Progressing toward  goals    Frequency    BID      PT Plan Current plan remains appropriate    Co-evaluation              AM-PAC PT "6 Clicks" Mobility   Outcome Measure  Help needed turning from your back to your side while in a flat bed without using bedrails?: None Help needed moving from lying on your back to sitting on the side of a flat bed without using bedrails?: None Help needed moving to and from a bed to a chair (including a wheelchair)?: A Little Help needed standing up  from a chair using your arms (e.g., wheelchair or bedside chair)?: A Little Help needed to walk in hospital room?: A Little Help needed climbing 3-5 steps with a railing? : A Little 6 Click Score: 20    End of Session Equipment Utilized During Treatment: Gait belt Activity Tolerance: Patient tolerated treatment well Patient left: in chair;with call bell/phone within reach;with chair alarm set Nurse Communication: Mobility status PT Visit Diagnosis: Difficulty in walking, not elsewhere classified (R26.2);Pain Pain - Right/Left: Right Pain - part of body: Knee     Time: 0900-0922 PT Time Calculation (min) (ACUTE ONLY): 22 min  Charges:  $Therapeutic Exercise: 8-22 mins                     Geraldine Sandberg, PT, GCS 12/25/19,9:30 AM

## 2019-12-25 NOTE — TOC Transition Note (Signed)
Transition of Care [facility]) - CM/SW Discharge Note   Patient Details  Name: Joanna Hall MRN: [id number] Date of Birth: 21-Apr-1966  [facility] CM/SW Contact:  Elease Hashimoto, LCSW Phone Number: 12/25/2019, 8:16 AM   Clinical Narrative:   Pt doing well and ready for discharge according to MD. Jackquline Denmark in place to provide home health services. Pt has all needed equipment and parents are there to assist her if needed. No further follow due to discharge today.    Final next level of care: [place] Barriers to Discharge: Barriers Resolved   Patient Goals and CMS Choice Patient states their goals for this hospitalization and ongoing recovery are:: I hope to do well tomorrow I am not in pain now but have surgery meds.      Discharge Placement                Patient to be transferred to facility by: [place] to take home via car Name of family member notified: Mom Patient and family notified of of transfer: 12/25/19  Discharge Plan and Services In-house Referral: Clinical Social Work   Post Acute Care Choice: Home Health                    HH Arranged: PT, OT [facility] Agency: [facility] Date [place]: 12/24/19 Time HH Agency Contacted: 1500 Representative spoke with at [place]: [place] ([place]) Interventions     Readmission Risk Interventions Readmission Risk Prevention Plan 09/26/2019 07/24/2019 06/25/2019  Transportation Screening Complete Complete Complete  Medication Review Press photographer) Complete Referral to Pharmacy Complete  PCP or Specialist appointment within 3-5 days of discharge - Complete (No Data)  [place] or [place] - Complete -  SW Recovery Care/Counseling Consult - Complete -  Palliative Care Screening - Not Applicable Not [place] Not Applicable Not Applicable Not Applicable  Some recent data might be hidden

## 2019-12-26 ENCOUNTER — Encounter: Payer: Self-pay | Admitting: Internal Medicine

## 2019-12-27 ENCOUNTER — Emergency Department
Admission: EM | Admit: 2019-12-27 | Discharge: 2019-12-27 | Disposition: A | Discharge status: Home / Self Care | Payer: Medicaid Other | Attending: Emergency Medicine | Admitting: Emergency Medicine

## 2019-12-27 ENCOUNTER — Other Ambulatory Visit: Payer: Self-pay

## 2019-12-27 ENCOUNTER — Emergency Department: Payer: Medicaid Other

## 2019-12-27 DIAGNOSIS — C539 Malignant neoplasm of cervix uteri, unspecified: Secondary | ICD-10-CM | POA: Insufficient documentation

## 2019-12-27 DIAGNOSIS — Z87891 Personal history of nicotine dependence: Secondary | ICD-10-CM | POA: Diagnosis not present

## 2019-12-27 DIAGNOSIS — I10 Essential (primary) hypertension: Secondary | ICD-10-CM | POA: Diagnosis not present

## 2019-12-27 DIAGNOSIS — M7989 Other specified soft tissue disorders: Secondary | ICD-10-CM

## 2019-12-27 DIAGNOSIS — G8918 Other acute postprocedural pain: Secondary | ICD-10-CM | POA: Insufficient documentation

## 2019-12-27 DIAGNOSIS — R791 Abnormal coagulation profile: Secondary | ICD-10-CM | POA: Diagnosis not present

## 2019-12-27 DIAGNOSIS — Z79899 Other long term (current) drug therapy: Secondary | ICD-10-CM | POA: Diagnosis not present

## 2019-12-27 DIAGNOSIS — Z96653 Presence of artificial knee joint, bilateral: Secondary | ICD-10-CM | POA: Diagnosis not present

## 2019-12-27 DIAGNOSIS — R2241 Localized swelling, mass and lump, right lower limb: Secondary | ICD-10-CM | POA: Diagnosis not present

## 2019-12-27 LAB — CBC WITH DIFFERENTIAL/PLATELET
Abs Immature Granulocytes: 0.02 10*3/uL (ref 0.00–0.07)
Basophils Absolute: 0 10*3/uL (ref 0.0–0.1)
Basophils Relative: 0 %
Eosinophils Absolute: 0 10*3/uL (ref 0.0–0.5)
Eosinophils Relative: 0 %
HCT: 29 % — ABNORMAL LOW (ref 36.0–46.0)
Hemoglobin: 9.7 g/dL — ABNORMAL LOW (ref 12.0–15.0)
Immature Granulocytes: 0 %
Lymphocytes Relative: 13 %
Lymphs Abs: 0.6 10*3/uL — ABNORMAL LOW (ref 0.7–4.0)
MCH: 33 pg (ref 26.0–34.0)
MCHC: 33.4 g/dL (ref 30.0–36.0)
MCV: 98.6 fL (ref 80.0–100.0)
Monocytes Absolute: 0.5 10*3/uL (ref 0.1–1.0)
Monocytes Relative: 9 %
Neutro Abs: 4 10*3/uL (ref 1.7–7.7)
Neutrophils Relative %: 78 %
Platelets: 264 10*3/uL (ref 150–400)
RBC: 2.94 MIL/uL — ABNORMAL LOW (ref 3.87–5.11)
RDW: 12.9 % (ref 11.5–15.5)
WBC: 5.1 10*3/uL (ref 4.0–10.5)
nRBC: 0 % (ref 0.0–0.2)

## 2019-12-27 LAB — COMPREHENSIVE METABOLIC PANEL
ALT: 61 U/L — ABNORMAL HIGH (ref 0–44)
AST: 80 U/L — ABNORMAL HIGH (ref 15–41)
Albumin: 3.1 g/dL — ABNORMAL LOW (ref 3.5–5.0)
Alkaline Phosphatase: 269 U/L — ABNORMAL HIGH (ref 38–126)
Anion gap: 11 (ref 5–15)
BUN: 9 mg/dL (ref 6–20)
CO2: 26 mmol/L (ref 22–32)
Calcium: 8.5 mg/dL — ABNORMAL LOW (ref 8.9–10.3)
Chloride: 96 mmol/L — ABNORMAL LOW (ref 98–111)
Creatinine, Ser: 0.8 mg/dL (ref 0.44–1.00)
GFR calc Af Amer: >60 mL/min (ref >60)
GFR calc non Af Amer: >60 mL/min (ref >60)
Glucose, Bld: 78 mg/dL (ref 70–99)
Potassium: 3.7 mmol/L (ref 3.5–5.1)
Sodium: 133 mmol/L — ABNORMAL LOW (ref 135–145)
Total Bilirubin: 1.5 mg/dL — ABNORMAL HIGH (ref 0.3–1.2)
Total Protein: 6.9 g/dL (ref 6.5–8.1)

## 2019-12-27 LAB — APTT: aPTT: 37 seconds — ABNORMAL HIGH (ref 24–36)

## 2019-12-27 LAB — PROTIME-INR
INR: 1 (ref 0.8–1.2)
Prothrombin Time: 12.9 seconds (ref 11.4–15.2)

## 2019-12-27 MED ORDER — OXYCODONE HCL ER 10 MG PO T12A
20.0000 mg | EXTENDED_RELEASE_TABLET | Freq: Two times a day (BID) | ORAL | Status: DC
  Administered 2019-12-27: 20 mg via ORAL
  Filled 2019-12-27: qty 2

## 2019-12-27 MED ORDER — HYDROMORPHONE HCL 1 MG/ML IJ SOLN
2.0000 mg | Freq: Once | INTRAMUSCULAR | Status: AC
  Administered 2019-12-27: 2 mg via INTRAVENOUS
  Filled 2019-12-27: qty 2

## 2019-12-27 MED ORDER — SODIUM CHLORIDE 0.9 % IV BOLUS
1000.0000 mL | Freq: Once | INTRAVENOUS | Status: AC
  Administered 2019-12-27: 1000 mL via INTRAVENOUS

## 2019-12-27 MED ORDER — HYDROMORPHONE HCL 1 MG/ML IJ SOLN
1.0000 mg | INTRAMUSCULAR | Status: AC | PRN
  Administered 2019-12-27 (×2): 1 mg via INTRAVENOUS
  Filled 2019-12-27 (×2): qty 1

## 2019-12-27 MED ORDER — HEPARIN SOD (PORK) LOCK FLUSH 100 UNIT/ML IV SOLN
500.0000 [IU] | Freq: Once | INTRAVENOUS | Status: AC | PRN
  Administered 2019-12-27: 500 [IU] via INTRAVENOUS
  Filled 2019-12-27: qty 5

## 2019-12-27 MED ORDER — ONDANSETRON HCL 4 MG/2ML IJ SOLN
4.0000 mg | Freq: Once | INTRAMUSCULAR | Status: AC
  Administered 2019-12-27: 4 mg via INTRAVENOUS
  Filled 2019-12-27: qty 2

## 2019-12-27 NOTE — ED Notes (Signed)
EDP at bedside, explaining to patient about plan of care and plans to discharge home. Pt agreeable with plan.

## 2019-12-27 NOTE — ED Notes (Signed)
Pt assisted to bathroom, pt c/o pain with movement , did not put any weight on her right leg. Pt was assisted into wheelchair. Pt has all belongings and called her sister for a ride.  Pt c/o pain throughout being wheeled out to the lobby.   Pt advised to follow up with palliative medicine and ortho. Pt verbalized understanding.

## 2019-12-27 NOTE — ED Provider Notes (Signed)
[facility] Emergency Department Provider Note  ____________________________________________   First MD Initiated Contact with Patient 12/27/19 [phone]     (approximate)  I have reviewed the triage vital signs and the nursing notes.   HISTORY  Chief Complaint Leg Swelling    HPI Joanna Hall is a 54 y.o. female with stage IV cervical cancer on palliative care, prior DVTs who was recently discharged on 3/11 after having knee surgery for osteoarthritis.    Patient underwent total knee arthroplasty by Dr. [name] on 3/10 who now comes in for increasing swelling.  Patient notes right leg swelling and increased pain.  Patient has been on Lovenox Sunday, Monday, Tuesday.  Surgery was on Wednesday and the plan was to start Xarelto on Monday.  Patient states that she is been on Xarelto use for over a year due to prior blood clots.  She states that today she has had worsening pain in her right leg that is severe, constant, not better with home medications, nothing makes it worse.  She is also noticed some increased swelling to it as well as increased pain in bruising.  She denies any falls since the surgery.  She stated that she went to the orthopedic clinic yesterday who stated that this was normal postop changes.  However she called the on-call orthopedic doctor today and was told to come to the ER to make sure there was no blood clot.          Past Medical History:  Diagnosis Date  . Abdominal pain 06/10/2018  . Abnormal cervical Papanicolaou smear 09/18/2017  . Anxiety   . Aortic atherosclerosis ([place])   . Arthritis    neck and knees  . 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 Blood transfusion without reported diagnosis   . Cervical cancer ([place]) 09/2016   mets lung  . Chronic anal fissure   . Chronic diarrhea   . Dyspnea   . Erosive gastropathy 09/18/2017  . Factor V Leiden mutation ([place])   . Fecal incontinence   . Genital warts   . GERD  (gastroesophageal reflux disease)   . GI bleed 10/08/2018  . Heart murmur   . Hematochezia   . Hemorrhoids   . 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 Hepatitis, chronic ([place]) 05/05/2017  . History of cancer chemotherapy    completed 06/2017  . History of Clostridium difficile infection    while undergoing chemo.  Negative test 10/2017  . Ileocolic anastomotic leak   . Infarction of kidney ([place]) left kidney   and uterus  . Intestinal infection due to Clostridium difficile 09/18/2017  . Macrocytic anemia with vitamin B12 deficiency   . Multiple gastric ulcers   . Nausea vomiting and diarrhea   . Pancolitis ([place]) 07/27/2018  . Perianal condylomata   . Pneumonia    History of  . Pulmonary nodules   . Rectal bleeding   . Small bowel obstruction ([place]) 08/2017  . Stiff neck    limited right turn  . Vitamin D deficiency     Patient Active Problem List   Diagnosis Date Noted  . History of total knee arthroplasty, right 12/24/2019  . Moderate episode of recurrent major depressive disorder ([place]) 11/13/2019  . Acute colitis 09/25/2019  . Anxiety and depression   . Thrombocytopenia ([place]) 08/17/2019  . Intractable lower abdominal pain   . Abdominal pain 07/23/2019  .  Constipation 07/22/2019  . Malnutrition of moderate degree 06/22/2019  . Small bowel obstruction due to adhesions ([place]) 06/21/2019  . Acute gastroenteritis 06/14/2019  . Intractable nausea and vomiting 06/07/2019  . Chest pain 05/19/2019  . Intractable pain 05/19/2019  . Cancer associated pain   . Proctitis, radiation   . Palliative care encounter   . Encounter for monitoring opioid maintenance therapy 11/19/2018  . Adrenal insufficiency ([place]) 09/03/2018  . Nausea vomiting and diarrhea   . Chronic diarrhea 06/25/2018  . Chronic anticoagulation 06/25/2018  . Vomiting   . Encounter for antineoplastic chemotherapy 06/04/2018  . 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 Closed fracture of distal end of radius 05/04/2018  . History of total knee arthroplasty 04/24/2018  . Osteoarthritis of left knee 02/28/2018  . Condyloma acuminatum of anus s/p ablation 02/22/2018 02/22/2018  . Condyloma acuminatum of vagina s/p ablation 02/22/2018 02/22/2018  . Positive ANA (antinuclear antibody) 02/04/2018  . Aortic atherosclerosis ([place]) 01/15/2018  . Elevated MCV 01/15/2018  . Anemia 01/15/2018  . Genital warts 01/15/2018  . Vitamin D deficiency 01/15/2018  . Pernicious anemia   . Impingement syndrome of shoulder region 12/19/2017  . Multiple joint pain 12/19/2017  . Osteoarthritis of right knee 12/19/2017  . B12 deficiency 12/11/2017  . Lung nodules 12/11/2017  . History of Clostridium difficile infection 10/23/2017  . Factor V Leiden ([place]) 10/19/2017  . Goals of care, counseling/discussion 10/19/2017  . Cervical cancer, FIGO stage IB1 ([place]) 09/18/2017  . Chronic hepatitis C without hepatic coma ([place]) 09/18/2017  . Cytopenia 09/18/2017  . Diarrhea 09/18/2017  . Elevated LFTs 09/18/2017  . Lower abdominal pain 09/18/2017  . 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 Malignant neoplasm of overlapping sites of cervix ([place]) 09/18/2017  . Renal insufficiency 09/18/2017  . Wound infection after surgery 09/12/2017  . Hypokalemia   . Hypomagnesemia   . Cervical arthritis 07/18/2017  . Dysuria 06/20/2017  . Metastasis from cervical cancer ([place]) 05/12/2017  . Essential hypertension 03/15/2017  . Anemia of chronic disease 03/01/2017  . Chemotherapy-induced neutropenia ([place]) 01/29/2017  . 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[place]) 09/25/2016    Past Surgical History:  Procedure Laterality Date  . CHOLECYSTECTOMY    . COLON SURGERY  08/2017   resection  . COLONOSCOPY WITH PROPOFOL N/A 12/20/2017   Procedure: COLONOSCOPY WITH PROPOFOL;  Surgeon: [name], MD;  Location[facility] END[facility]COPY;  Service: Gastroenterology;  Laterality: N/A;  . COLONOSCOPY WITH  PROPOFOL N/A 07/30/2018   Procedure: COLONOSCOPY WITH PROPOFOL;  Surgeon: [name], MD;  Location: [facility] ENDOSCOPY;  Service: Gastroenterology;  Laterality: N/A;  . COLONOSCOPY WITH PROPOFOL N/A 10/10/2018   Procedure: COLONOSCOPY WITH PROPOFOL;  Surgeon: Lucilla Lame, MD;  Location: [facility] ENDOSCOPY;  Service: Endoscopy;  Laterality: N/A;  . DIAGNOSTIC LAPAROSCOPY    . ESOPHAGOGASTRODUODENOSCOPY (EGD) WITH PROPOFOL N/A 12/20/2017   Procedure: ESOPHAGOGASTRODUODENOSCOPY (EGD) WITH PROPOFOL;  Surgeon: [name], MD;  Location: [place];  Service: Gastroenterology;  Laterality: N/A;  . ESOPHAGOGASTRODUODENOSCOPY (EGD) WITH PROPOFOL  07/30/2018   Procedure: ESOPHAGOGASTRODUODENOSCOPY (EGD) WITH PROPOFOL;  Surgeon: [name], MD;  Location: [facility] ENDOSCOPY;  Service: Gastroenterology;;  . Otho Darner SIGMOIDOSCOPY N/A 11/21/2018   Procedure: FLEXIBLE SIGMOIDOSCOPY;  Surgeon: [name], MD;  Location: [facility] ENDOSCOPY;  Service: Gastroenterology;  Laterality: N/A;  . LAPAROTOMY N/A 08/31/2017   Procedure: EXPLORATORY LAPAROTOMY for SBO, ileocolectomy, removal of piece of uterine wall;  Surgeon: [name], MD;  Location: [facility] ORS;  Service:  General;  Laterality: N/A;  . LASER ABLATION CONDOLAMATA N/A 02/22/2018   Procedure: LASER ABLATION/REMOVAL OF CONDOLAMATA AROUND ANUS AND VAGINA;  Surgeon: Michael Boston, MD;  Location: [place];  Service: General;  Laterality: N/A;  . OOPHORECTOMY    . PORTA CATH INSERTION N/A 05/13/2018   Procedure: PORTA CATH INSERTION;  Surgeon: Katha Cabal, MD;  Location: [place] CV LAB;  Service: Cardiovascular;  Laterality: N/A;  . SMALL INTESTINE SURGERY    . TANDEM RING INSERTION     x3  . THORACOTOMY    . TOTAL KNEE ARTHROPLASTY Left 04/24/2018   Procedure: TOTAL KNEE ARTHROPLASTY;  Surgeon: Lovell Sheehan, MD;  Location: [facility] ORS;  Service: Orthopedics;  Laterality: Left;  . TOTAL KNEE ARTHROPLASTY  Right 12/24/2019   Procedure: RIGHT TOTAL KNEE ARTHROPLASTY;  Surgeon: Lovell Sheehan, MD;  Location: [facility] ORS;  Service: Orthopedics;  Laterality: Right;    Prior to Admission medications   Medication Sig Start Date End Date Taking? Authorizing Provider  amitriptyline (ELAVIL) 100 MG tablet Take 1 tablet (100 mg total) by mouth at bedtime. 11/10/19 02/08/20  [name], MD  Calcium Carb-Cholecalciferol (CALCIUM 500 +D) 500-400 MG-UNIT TABS Take 1 tablet by mouth daily.     [provider]  colchicine 0.6 MG tablet Take 0.6 mg by mouth daily.  06/19/19   [provider]  cyanocobalamin (,VITAMIN B-12,) 1000 MCG/ML injection Inject 1,000 mcg into the muscle every 21 ( twenty-one) days.  11/29/17   [provider]  dexamethasone (DECADRON) 4 MG tablet Take 4 mg by mouth See admin instructions. Take 4 mg twice daily for 3 days starting 3 days prior to chemo 08/01/19   [provider]  diphenoxylate-atropine (LOMOTIL) 2.5-0.025 MG tablet Take 2 tablets by mouth 2 (two) times daily as needed for diarrhea or loose stools. Patient not taking: Reported on 12/10/2019 09/29/19   Loletha Grayer, MD  DM-Phenylephrine-Acetaminophen (COLD RELIEF PO) Take 2 tablets by mouth daily as needed (cold symptoms).    [provider]  docusate sodium (COLACE) 100 MG capsule Take 1 capsule (100 mg total) by mouth 2 (two) times daily. 12/25/19   Carlynn Spry, PA-C  enoxaparin (LOVENOX) 120 MG/0.8ML injection Inject 0.8 mLs (120 mg total) into the skin daily. 12/11/19   Cammie Sickle, MD  famotidine (PEPCID) 20 MG tablet Take 1 tablet (20 mg total) by mouth 2 (two) times daily. 10/18/19   Carrie Mew, MD  folic acid (FOLVITE) 1 MG tablet Take 1 tablet (1 mg total) by mouth daily. 07/31/19   Cammie Sickle, MD  hydrocortisone (CORTEF) 10 MG tablet Take 1 tablet (10 mg total) by mouth 2 (two) times daily. 10/27/19 01/25/20  [name], MD  hydrOXYzine  (VISTARIL) 25 MG capsule Take 1 capsule (25 mg total) by mouth 3 (three) times daily as needed for itching. 12/15/19   Cammie Sickle, MD  hyoscyamine (LEVBID) 0.375 MG 12 hr tablet Take 1 tablet (0.375 mg total) by mouth 2 (two) times daily. 12/17/19   [name], MD  methocarbamol (ROBAXIN) 500 MG tablet Take 1 tablet (500 mg total) by mouth every 6 (six) hours as needed for muscle spasms. 12/25/19   Carlynn Spry, PA-C  Multiple Vitamins-Minerals (SUPER THERA VITE M) TABS Take 1 tablet by mouth daily.     [provider]  ondansetron (ZOFRAN ODT) 4 MG disintegrating tablet Take 1 tablet (4 mg total) by mouth every 8 (eight) hours as  needed for nausea or vomiting. 10/18/19   Carrie Mew, MD  oxyCODONE (OXYCONTIN) 20 mg 12 hr tablet Take 1 tablet (20 mg total) by mouth every 8 (eight) hours. 12/03/19   Borders, Kirt Boys, NP  oxyCODONE 10 MG TABS Take 1 tablet (10 mg total) by mouth every 4 (four) hours as needed (for breakthrough pain). 12/25/19   Carlynn Spry, PA-C  Oxycodone HCl 10 MG TABS Take 1-2 tablets (10-20 mg total) by mouth every 6 (six) hours as needed (for breakthrough pain). Patient taking differently: Take 10 mg by mouth every 6 (six) hours as needed (for breakthrough pain).  12/11/19   Cammie Sickle, MD  pantoprazole (PROTONIX) 40 MG tablet Take 1 tablet (40 mg total) by mouth daily. 12/05/19   Cammie Sickle, MD  potassium chloride SA (KLOR-CON) 20 MEQ tablet Take 1 tablet (20 mEq total) by mouth 2 (two) times daily. 09/10/19   Verlon Au, NP  promethazine (PHENERGAN) 25 MG tablet Take 1 tablet (25 mg total) by mouth every 8 (eight) hours as needed for nausea or vomiting. 08/25/19   Cammie Sickle, MD  rivaroxaban (XARELTO) 20 MG TABS tablet Take 1 tablet (20 mg total) by mouth every morning. Takes in the morning Patient not taking: Reported on 12/22/2019 09/29/19   Loletha Grayer, MD  sertraline (ZOLOFT) 25 MG tablet Take 1 tablet (25  mg total) by mouth daily. 09/29/19   Loletha Grayer, MD  triamcinolone ointment (KENALOG) 0.5 % Apply 1 application topically 2 (two) times daily. Patient taking differently: Apply 1 application topically 2 (two) times daily as needed (itching).  11/20/19   Jacquelin Hawking, NP  VENTOLIN HFA 108 (90 Base) MCG/ACT inhaler Inhale 1-2 puffs into the lungs every 4 (four) hours as needed for shortness of breath. 02/19/19   Verlon Au, NP    Allergies Ketamine  Family History  Problem Relation Age of Onset  . Hypertension Mother   . Hypertension Father   . Diabetes Father   . Hyperlipidemia Father   . Alcohol abuse Daughter   . Hypertension Maternal Grandmother   . Diabetes Maternal Grandmother   . Diabetes Paternal Grandmother   . Hypertension Paternal Grandmother     Social History Social History   Tobacco Use  . Smoking status: Former Smoker    Packs/day: 0.25    Years: 10.00    Pack years: 2.50    Types: Cigarettes    Quit date: 10/16/2006    Years since quitting: 13.2  . Smokeless tobacco: Never Used  Substance Use Topics  . Alcohol use: Not Currently    Comment: seldom  . Drug use: Yes    Types: Marijuana    Comment: not very often      Review of Systems Constitutional: No fever/chills Eyes: No visual changes. ENT: No sore throat. Cardiovascular: Denies chest pain. Respiratory: Denies shortness of breath. Gastrointestinal: No abdominal pain.  No nausea, no vomiting.  No diarrhea.  No constipation. Genitourinary: Negative for dysuria. Musculoskeletal: Negative for back pain.  Positive right leg swelling Skin: Negative for rash. Neurological: Negative for headaches, focal weakness or numbness. All other ROS negative ____________________________________________   PHYSICAL EXAM:  VITAL SIGNS: Pulse (!) 115, temperature 98.2 F (36.8 C), temperature source Oral, resp. rate 18, height 5' 7.5" (1.715 m), weight 78 kg, SpO2 100 %.   Constitutional: Alert  and oriented. Well appearing and in no acute distress. Eyes: Conjunctivae are normal. EOMI. Head: Atraumatic. Nose: No congestion/rhinnorhea. Mouth/Throat:  Mucous membranes are moist.   Neck: No stridor. Trachea Midline. FROM Cardiovascular: Normal rate, regular rhythm. Grossly normal heart sounds.  Good peripheral circulation. Respiratory: Normal respiratory effort.  No retractions. Lungs CTAB. Gastrointestinal: Soft and nontender. No distention. No abdominal bruits.  Musculoskeletal: Right leg swelling around the knee and into the calf.  Sensation intact.  Compartments feel soft.  May be slightly warmer compared to the left side but not significantly different.  No redness noted over the skin bruising noted, staples in place over the knee with no erythema or discharge from them Neurologic:  Normal speech and language. No gross focal neurologic deficits are appreciated.  Skin:  Skin is warm, dry and intact. No rash noted. Psychiatric: Mood and affect are normal. Speech and behavior are normal. GU: Deferred   ____________________________________________   LABS (all labs ordered are listed, but only abnormal results are displayed)  Labs Reviewed  CBC WITH DIFFERENTIAL/PLATELET - Abnormal; Notable for the following components:      Result Value   RBC 2.94 (*)    Hemoglobin 9.7 (*)    HCT 29.0 (*)    Lymphs Abs 0.6 (*)    All other components within normal limits  COMPREHENSIVE METABOLIC PANEL - Abnormal; Notable for the following components:   Sodium 133 (*)    Chloride 96 (*)    Calcium 8.5 (*)    Albumin 3.1 (*)    AST 80 (*)    ALT 61 (*)    Alkaline Phosphatase 269 (*)    Total Bilirubin 1.5 (*)    All other components within normal limits  APTT - Abnormal; Notable for the following components:   aPTT 37 (*)    All other components within normal limits  PROTIME-INR   ____________________________________________  RADIOLOGY   Official radiology report(s): US Venous  Img Lower Unilateral Right  Result Date: 12/27/2019 CLINICAL DATA:  54 year old with right leg swelling for 2 days. Recent knee replacement. EXAM: RIGHT LOWER EXTREMITY VENOUS DOPPLER ULTRASOUND TECHNIQUE: Gray-scale sonography with graded compression, as well as color Doppler and duplex ultrasound were performed to evaluate the lower extremity deep venous systems from the level of the common femoral vein and including the common femoral, femoral, profunda femoral, popliteal and calf veins including the posterior tibial, peroneal and gastrocnemius veins when visible. The superficial great saphenous vein was also interrogated. Spectral Doppler was utilized to evaluate flow at rest and with distal augmentation maneuvers in the common femoral, femoral and popliteal veins. COMPARISON:  None. FINDINGS: Contralateral Common Femoral Vein: Respiratory phasicity is normal and symmetric with the symptomatic side. No evidence of thrombus. Normal compressibility. Common Femoral Vein: No evidence of thrombus. Normal compressibility, respiratory phasicity and response to augmentation. Saphenofemoral Junction: No evidence of thrombus. Normal compressibility and flow on color Doppler imaging. Profunda Femoral Vein: No evidence of thrombus. Normal compressibility and flow on color Doppler imaging. Femoral Vein: No evidence of thrombus. Normal compressibility, respiratory phasicity and response to augmentation. Popliteal Vein: No evidence of thrombus. Normal compressibility, respiratory phasicity and response to augmentation. Calf Veins: Due to leg edema, the calf veins cannot be confidently identified or evaluated on this examination. Limited Doppler flow in some of the tibial veins. Other Findings:  None. IMPRESSION: No evidence for deep venous thrombosis in the right leg above the knee. Unfortunately, the deep calf veins cannot be evaluated and cannot exclude DVT in the deep calf veins. Electronically Signed   By: Markus Daft  M.D.   On: 12/27/2019 11:07  ____________________________________________   PROCEDURES  Procedure(s) performed (including Critical Care):  Procedures   ____________________________________________   INITIAL IMPRESSION / ASSESSMENT AND PLAN / ED COURSE  Joanna Hall was evaluated in Emergency Department on 12/27/2019 for the symptoms described in the history of present illness. She was evaluated in the context of the global COVID-19 pandemic, which necessitated consideration that the patient might be at risk for infection with the SARS-CoV-2 virus that causes COVID-19. Institutional protocols and algorithms that pertain to the evaluation of patients at risk for COVID-19 are in a state of rapid change based on information released by regulatory bodies including the CDC and federal and state organizations. These policies and algorithms were followed during the patient's care in the ED.    Patient is a 54 year old postop from a knee replacement on 3/10 who comes in for worsening leg swelling with a history of DVTs.  Will get labs to evaluate for anemia.  Will get ultrasound to evaluate for DVT.  Denies any recurrent trauma to suggest fractures.  Patient has good distal pulse so unlikely arterial occlusion.  Patient's compartments are soft and given the good distal pulse I will suspicion for compartment syndrome at this time.  No evidence of redness or significant warmth over the knee to suggest septic joint.  Patient is slightly tachycardic and I suspect this is from her pain.  We will give some IV Dilaudid and reassess.  Patient's sodium and chloride slightly low.  Patient states that she not been eating or drinking very well.  We will give a liter of fluid due to her tachycardia.  LFTs are slightly elevated but patient states that she does have history of hepatitis.  Patient had prior cholecystectomy and no new upper quadrant pain.  With patient's tachycardia she denies any chest pain or  shortness of breath to suggest PE as a cause of this.  Her ultrasound was negative for DVT in her thigh although I discussed with her that she could have a blood clot in the calf.  Given patient swelling and pain will discuss with the orthopedic doctor if we can restart her Xarelto given that her hemoglobin is trending upward since her surgery.  Discussed with patient that we will give another dose of IV Dilaudid 2 mg given the 1 mg was not helping resolve it give her her home OxyContin.   According to patient she has been on his OxyContin and oxycodone combination since October for her chronic pain from her cervical cancer so I suspect that that is why she is not having her pain adequately treated now because if not had to adjust the use.  Patient is going to talk with her palliative doctor to have an increased her medications.   We discussed that patient may need to be admitted if we cannot get her pain under control and she is hoping that we can get it controlled and she can go home.  Patient's leg was just evaluated yesterday by the orthopedic clinic and they stated that it looked typical for being postop therefore I think the swelling and bruising is just typical postop pain.  This time I have very low suspicion for compartment syndrome, septic joint, infection.  However we discussed return precautions in regards to all of these.  Discussed with Dr. Roland Rack, on call ortho.  He is okay with [place] restarting the Xarelto given she is postop a few days and given the fact that we would not able to fully visualize the veins  in the calf due to some of the swelling.  Also recommended ice on the knee and heat on the calf and keeping the leg elevated.  1:08 PM reevaluated patient and her pain is well controlled.  Heart rates come down to low 100s.  Patient states that she thinks is slightly elevated to having some pain still but is tolerating p.o. at bedside and is afebrile I have low suspicion this is a sign of  sepsis.  Patient feels comfortable with going home and will follow with orthopedic clinic on Monday  I discussed the provisional nature of ED diagnosis, the treatment so far, the ongoing plan of care, follow up appointments and return precautions with the patient and any family or support people present. They expressed understanding and agreed with the plan, discharged home.   ____________________________________________   FINAL CLINICAL IMPRESSION(S) / ED DIAGNOSES   Final diagnoses:  Leg swelling  Post-op pain      MEDICATIONS GIVEN DURING THIS VISIT:  Medications  oxyCODONE (OXYCONTIN) 12 hr tablet 20 mg (20 mg Oral Given 12/27/19 1150)  HYDROmorphone (DILAUDID) injection 1 mg (1 mg Intravenous Given 12/27/19 1027)  ondansetron (ZOFRAN) injection 4 mg (4 mg Intravenous Given 12/27/19 0934)  sodium chloride 0.9 % bolus 1,000 mL (0 mLs Intravenous Stopped 12/27/19 1304)  HYDROmorphone (DILAUDID) injection 2 mg (2 mg Intravenous Given 12/27/19 1150)     ED Discharge Orders    None       Note:  This document was prepared using Dragon voice recognition software and may include unintentional dictation errors.   Vanessa [place], MD 12/27/19 1310

## 2019-12-27 NOTE — ED Triage Notes (Addendum)
Pt arrived via [facility] from home with reports of right leg swelling and increased pain x 2 days. S/p right knee replacement on Wednesday.  Pt is on Xarelto pt stopped on Saturday, was placed on Lovenox Sun Mon and Tues, states she is due to start Xarelto on Monday.  Pt has bruising and and swelling x 2 days, states decreased sensation in  Right leg.  Pt has hx of clots, pt has hx of stage IV CA as well not currently on treatment.  Pt c/o nausea states she takes zofran and phenergan for nausea. Pt also is on daily pain meds for CA treatment but has not taken anything for pain today.  On arrival PMS intact, dressing is stained with blood on arrival.

## 2019-12-27 NOTE — Discharge Instructions (Addendum)
You can restart your Xarelto.  I also discussed with orthopedics who recommended keeping your leg elevated, putting ice on the knee and heat pack on the calf.  You should call your orthopedic doctor on Monday to be reevaluated.  Return to the ER for fevers, worsening pain, coldness in the leg, tingling sensation or any other concerns.  Follow-up with your palliative team to see if they will adjust your medications

## 2019-12-28 ENCOUNTER — Observation Stay: Payer: Medicaid Other

## 2019-12-28 ENCOUNTER — Encounter: Payer: Self-pay | Admitting: Family Medicine

## 2019-12-28 ENCOUNTER — Other Ambulatory Visit: Payer: Self-pay

## 2019-12-28 ENCOUNTER — Inpatient Hospital Stay
Admission: EM | Admit: 2019-12-28 | Discharge: 2020-01-02 | DRG: 920 | Disposition: A | Discharge status: Home Health | Payer: Medicaid Other | Attending: Internal Medicine | Admitting: Internal Medicine

## 2019-12-28 ENCOUNTER — Emergency Department: Payer: Medicaid Other

## 2019-12-28 DIAGNOSIS — K219 Gastro-esophageal reflux disease without esophagitis: Secondary | ICD-10-CM | POA: Diagnosis present

## 2019-12-28 DIAGNOSIS — R112 Nausea with vomiting, unspecified: Secondary | ICD-10-CM

## 2019-12-28 DIAGNOSIS — M25461 Effusion, right knee: Secondary | ICD-10-CM | POA: Diagnosis not present

## 2019-12-28 DIAGNOSIS — N280 Ischemia and infarction of kidney: Secondary | ICD-10-CM | POA: Diagnosis present

## 2019-12-28 DIAGNOSIS — M96840 Postprocedural hematoma of a musculoskeletal structure following a musculoskeletal system procedure: Principal | ICD-10-CM | POA: Diagnosis present

## 2019-12-28 DIAGNOSIS — Z8673 Personal history of transient ischemic attack (TIA), and cerebral infarction without residual deficits: Secondary | ICD-10-CM

## 2019-12-28 DIAGNOSIS — Z9221 Personal history of antineoplastic chemotherapy: Secondary | ICD-10-CM

## 2019-12-28 DIAGNOSIS — Z8541 Personal history of malignant neoplasm of cervix uteri: Secondary | ICD-10-CM

## 2019-12-28 DIAGNOSIS — Z86718 Personal history of other venous thrombosis and embolism: Secondary | ICD-10-CM

## 2019-12-28 DIAGNOSIS — R1030 Lower abdominal pain, unspecified: Secondary | ICD-10-CM

## 2019-12-28 DIAGNOSIS — Z66 Do not resuscitate: Secondary | ICD-10-CM | POA: Diagnosis present

## 2019-12-28 DIAGNOSIS — N134 Hydroureter: Secondary | ICD-10-CM | POA: Diagnosis present

## 2019-12-28 DIAGNOSIS — Z833 Family history of diabetes mellitus: Secondary | ICD-10-CM

## 2019-12-28 DIAGNOSIS — Z923 Personal history of irradiation: Secondary | ICD-10-CM

## 2019-12-28 DIAGNOSIS — D6851 Activated protein C resistance: Secondary | ICD-10-CM | POA: Diagnosis present

## 2019-12-28 DIAGNOSIS — Z96653 Presence of artificial knee joint, bilateral: Secondary | ICD-10-CM | POA: Diagnosis present

## 2019-12-28 DIAGNOSIS — Z79899 Other long term (current) drug therapy: Secondary | ICD-10-CM

## 2019-12-28 DIAGNOSIS — M7989 Other specified soft tissue disorders: Secondary | ICD-10-CM | POA: Diagnosis present

## 2019-12-28 DIAGNOSIS — Z87891 Personal history of nicotine dependence: Secondary | ICD-10-CM

## 2019-12-28 DIAGNOSIS — E559 Vitamin D deficiency, unspecified: Secondary | ICD-10-CM | POA: Diagnosis present

## 2019-12-28 DIAGNOSIS — C7800 Secondary malignant neoplasm of unspecified lung: Secondary | ICD-10-CM | POA: Diagnosis present

## 2019-12-28 DIAGNOSIS — Z8349 Family history of other endocrine, nutritional and metabolic diseases: Secondary | ICD-10-CM

## 2019-12-28 DIAGNOSIS — M79604 Pain in right leg: Secondary | ICD-10-CM | POA: Diagnosis not present

## 2019-12-28 DIAGNOSIS — Z7901 Long term (current) use of anticoagulants: Secondary | ICD-10-CM

## 2019-12-28 DIAGNOSIS — Y838 Other surgical procedures as the cause of abnormal reaction of the patient, or of later complication, without mention of misadventure at the time of the procedure: Secondary | ICD-10-CM | POA: Diagnosis present

## 2019-12-28 DIAGNOSIS — R600 Localized edema: Secondary | ICD-10-CM

## 2019-12-28 DIAGNOSIS — I700 Atherosclerosis of aorta: Secondary | ICD-10-CM | POA: Diagnosis present

## 2019-12-28 DIAGNOSIS — E876 Hypokalemia: Secondary | ICD-10-CM | POA: Diagnosis present

## 2019-12-28 DIAGNOSIS — M25061 Hemarthrosis, right knee: Secondary | ICD-10-CM | POA: Diagnosis present

## 2019-12-28 DIAGNOSIS — Z8711 Personal history of peptic ulcer disease: Secondary | ICD-10-CM

## 2019-12-28 DIAGNOSIS — B182 Chronic viral hepatitis C: Secondary | ICD-10-CM | POA: Diagnosis present

## 2019-12-28 DIAGNOSIS — M5136 Other intervertebral disc degeneration, lumbar region: Secondary | ICD-10-CM | POA: Diagnosis present

## 2019-12-28 DIAGNOSIS — D519 Vitamin B12 deficiency anemia, unspecified: Secondary | ICD-10-CM | POA: Diagnosis present

## 2019-12-28 DIAGNOSIS — Z888 Allergy status to other drugs, medicaments and biological substances status: Secondary | ICD-10-CM

## 2019-12-28 DIAGNOSIS — N1330 Unspecified hydronephrosis: Secondary | ICD-10-CM | POA: Diagnosis present

## 2019-12-28 DIAGNOSIS — E2740 Unspecified adrenocortical insufficiency: Secondary | ICD-10-CM | POA: Diagnosis present

## 2019-12-28 DIAGNOSIS — R7401 Elevation of levels of liver transaminase levels: Secondary | ICD-10-CM

## 2019-12-28 DIAGNOSIS — B1920 Unspecified viral hepatitis C without hepatic coma: Secondary | ICD-10-CM | POA: Diagnosis present

## 2019-12-28 DIAGNOSIS — I82409 Acute embolism and thrombosis of unspecified deep veins of unspecified lower extremity: Secondary | ICD-10-CM

## 2019-12-28 DIAGNOSIS — E871 Hypo-osmolality and hyponatremia: Secondary | ICD-10-CM | POA: Diagnosis present

## 2019-12-28 DIAGNOSIS — R748 Abnormal levels of other serum enzymes: Secondary | ICD-10-CM | POA: Diagnosis present

## 2019-12-28 DIAGNOSIS — I10 Essential (primary) hypertension: Secondary | ICD-10-CM | POA: Diagnosis present

## 2019-12-28 DIAGNOSIS — F419 Anxiety disorder, unspecified: Secondary | ICD-10-CM | POA: Diagnosis present

## 2019-12-28 DIAGNOSIS — Z20822 Contact with and (suspected) exposure to covid-19: Secondary | ICD-10-CM | POA: Diagnosis present

## 2019-12-28 DIAGNOSIS — M25561 Pain in right knee: Secondary | ICD-10-CM | POA: Diagnosis not present

## 2019-12-28 DIAGNOSIS — C538 Malignant neoplasm of overlapping sites of cervix uteri: Secondary | ICD-10-CM | POA: Diagnosis present

## 2019-12-28 DIAGNOSIS — Z8249 Family history of ischemic heart disease and other diseases of the circulatory system: Secondary | ICD-10-CM

## 2019-12-28 DIAGNOSIS — M62838 Other muscle spasm: Secondary | ICD-10-CM | POA: Diagnosis present

## 2019-12-28 LAB — CBC WITH DIFFERENTIAL/PLATELET
Abs Immature Granulocytes: 0.02 10*3/uL (ref 0.00–0.07)
Basophils Absolute: 0 10*3/uL (ref 0.0–0.1)
Basophils Relative: 0 %
Eosinophils Absolute: 0 10*3/uL (ref 0.0–0.5)
Eosinophils Relative: 1 %
HCT: 28.2 % — ABNORMAL LOW (ref 36.0–46.0)
Hemoglobin: 9.2 g/dL — ABNORMAL LOW (ref 12.0–15.0)
Immature Granulocytes: 0 %
Lymphocytes Relative: 15 %
Lymphs Abs: 0.8 10*3/uL (ref 0.7–4.0)
MCH: 32.7 pg (ref 26.0–34.0)
MCHC: 32.6 g/dL (ref 30.0–36.0)
MCV: 100.4 fL — ABNORMAL HIGH (ref 80.0–100.0)
Monocytes Absolute: 0.6 10*3/uL (ref 0.1–1.0)
Monocytes Relative: 10 %
Neutro Abs: 4.2 10*3/uL (ref 1.7–7.7)
Neutrophils Relative %: 74 %
Platelets: 286 10*3/uL (ref 150–400)
RBC: 2.81 MIL/uL — ABNORMAL LOW (ref 3.87–5.11)
RDW: 13 % (ref 11.5–15.5)
WBC: 5.6 10*3/uL (ref 4.0–10.5)
nRBC: 0 % (ref 0.0–0.2)

## 2019-12-28 LAB — COMPREHENSIVE METABOLIC PANEL
ALT: 58 U/L — ABNORMAL HIGH (ref 0–44)
AST: 79 U/L — ABNORMAL HIGH (ref 15–41)
Albumin: 3.1 g/dL — ABNORMAL LOW (ref 3.5–5.0)
Alkaline Phosphatase: 241 U/L — ABNORMAL HIGH (ref 38–126)
Anion gap: 10 (ref 5–15)
BUN: 10 mg/dL (ref 6–20)
CO2: 26 mmol/L (ref 22–32)
Calcium: 8.5 mg/dL — ABNORMAL LOW (ref 8.9–10.3)
Chloride: 96 mmol/L — ABNORMAL LOW (ref 98–111)
Creatinine, Ser: 0.87 mg/dL (ref 0.44–1.00)
GFR calc Af Amer: >60 mL/min (ref >60)
GFR calc non Af Amer: >60 mL/min (ref >60)
Glucose, Bld: 71 mg/dL (ref 70–99)
Potassium: 3.6 mmol/L (ref 3.5–5.1)
Sodium: 132 mmol/L — ABNORMAL LOW (ref 135–145)
Total Bilirubin: 1.4 mg/dL — ABNORMAL HIGH (ref 0.3–1.2)
Total Protein: 6.7 g/dL (ref 6.5–8.1)

## 2019-12-28 LAB — PROTIME-INR
INR: 2.7 — ABNORMAL HIGH (ref 0.8–1.2)
Prothrombin Time: 28.5 seconds — ABNORMAL HIGH (ref 11.4–15.2)

## 2019-12-28 LAB — LACTIC ACID, PLASMA: Lactic Acid, Venous: 0.8 mmol/L (ref 0.5–1.9)

## 2019-12-28 LAB — CK: Total CK: 68 U/L (ref 38–234)

## 2019-12-28 MED ORDER — OXYCODONE HCL 5 MG PO TABS
10.0000 mg | ORAL_TABLET | ORAL | Status: DC | PRN
  Administered 2019-12-29 – 2020-01-01 (×11): 10 mg via ORAL
  Filled 2019-12-28 (×11): qty 2

## 2019-12-28 MED ORDER — IOHEXOL 300 MG/ML  SOLN
100.0000 mL | Freq: Once | INTRAMUSCULAR | Status: AC | PRN
  Administered 2019-12-28: 100 mL via INTRAVENOUS

## 2019-12-28 MED ORDER — ONDANSETRON HCL 4 MG PO TABS
4.0000 mg | ORAL_TABLET | Freq: Four times a day (QID) | ORAL | Status: DC | PRN
  Administered 2019-12-29 – 2019-12-31 (×3): 4 mg via ORAL
  Filled 2019-12-28 (×3): qty 1

## 2019-12-28 MED ORDER — RIVAROXABAN 20 MG PO TABS
20.0000 mg | ORAL_TABLET | Freq: Every morning | ORAL | Status: DC
  Administered 2019-12-29: 20 mg via ORAL
  Filled 2019-12-28: qty 1

## 2019-12-28 MED ORDER — HYDROXYZINE HCL 25 MG PO TABS
25.0000 mg | ORAL_TABLET | Freq: Three times a day (TID) | ORAL | Status: DC | PRN
  Administered 2019-12-29: 25 mg via ORAL
  Filled 2019-12-28 (×2): qty 1

## 2019-12-28 MED ORDER — FAMOTIDINE 20 MG PO TABS
20.0000 mg | ORAL_TABLET | Freq: Two times a day (BID) | ORAL | Status: DC
  Administered 2019-12-29 – 2020-01-02 (×9): 20 mg via ORAL
  Filled 2019-12-28 (×9): qty 1

## 2019-12-28 MED ORDER — SODIUM CHLORIDE 0.9 % IV SOLN: INTRAVENOUS | Status: DC

## 2019-12-28 MED ORDER — DEXAMETHASONE 4 MG PO TABS: 4.0000 mg | ORAL_TABLET | ORAL | Status: DC

## 2019-12-28 MED ORDER — MAGNESIUM HYDROXIDE 400 MG/5ML PO SUSP
30.0000 mL | Freq: Every day | ORAL | Status: DC | PRN
  Administered 2019-12-31 – 2020-01-01 (×2): 30 mL via ORAL
  Filled 2019-12-28 (×2): qty 30

## 2019-12-28 MED ORDER — SERTRALINE HCL 50 MG PO TABS
25.0000 mg | ORAL_TABLET | Freq: Every day | ORAL | Status: DC
  Administered 2019-12-29 – 2020-01-02 (×4): 25 mg via ORAL
  Filled 2019-12-28 (×4): qty 1

## 2019-12-28 MED ORDER — HYDROCORTISONE 10 MG PO TABS
10.0000 mg | ORAL_TABLET | Freq: Two times a day (BID) | ORAL | Status: DC
  Administered 2019-12-29 – 2020-01-02 (×8): 10 mg via ORAL
  Filled 2019-12-28 (×11): qty 1

## 2019-12-28 MED ORDER — HYDROMORPHONE HCL 1 MG/ML IJ SOLN
1.0000 mg | INTRAMUSCULAR | Status: AC | PRN
  Administered 2019-12-28 – 2019-12-29 (×3): 1 mg via INTRAVENOUS
  Filled 2019-12-28 (×3): qty 1

## 2019-12-28 MED ORDER — ACETAMINOPHEN 325 MG PO TABS
650.0000 mg | ORAL_TABLET | Freq: Four times a day (QID) | ORAL | Status: DC | PRN
  Administered 2019-12-30 – 2020-01-01 (×3): 650 mg via ORAL
  Filled 2019-12-28 (×3): qty 2

## 2019-12-28 MED ORDER — HYOSCYAMINE SULFATE ER 0.375 MG PO TB12
0.3750 mg | ORAL_TABLET | Freq: Two times a day (BID) | ORAL | Status: DC
  Administered 2019-12-29 – 2020-01-02 (×8): 0.375 mg via ORAL
  Filled 2019-12-28 (×11): qty 1

## 2019-12-28 MED ORDER — ADULT MULTIVITAMIN W/MINERALS CH
1.0000 | ORAL_TABLET | Freq: Every day | ORAL | Status: DC
  Administered 2019-12-29 – 2020-01-02 (×4): 1 via ORAL
  Filled 2019-12-28 (×4): qty 1

## 2019-12-28 MED ORDER — TRAZODONE HCL 50 MG PO TABS
25.0000 mg | ORAL_TABLET | Freq: Every evening | ORAL | Status: DC | PRN
  Administered 2019-12-29 – 2020-01-01 (×3): 25 mg via ORAL
  Filled 2019-12-28 (×3): qty 1

## 2019-12-28 MED ORDER — COLCHICINE 0.6 MG PO TABS
0.6000 mg | ORAL_TABLET | Freq: Every day | ORAL | Status: DC
  Administered 2019-12-29 – 2020-01-02 (×4): 0.6 mg via ORAL
  Filled 2019-12-28 (×4): qty 1

## 2019-12-28 MED ORDER — CYANOCOBALAMIN 1000 MCG/ML IJ SOLN: 1000.0000 ug | INTRAMUSCULAR | Status: DC

## 2019-12-28 MED ORDER — DOCUSATE SODIUM 100 MG PO CAPS
100.0000 mg | ORAL_CAPSULE | Freq: Two times a day (BID) | ORAL | Status: DC
  Administered 2019-12-29 – 2020-01-02 (×9): 100 mg via ORAL
  Filled 2019-12-28 (×9): qty 1

## 2019-12-28 MED ORDER — POTASSIUM CHLORIDE CRYS ER 20 MEQ PO TBCR
20.0000 meq | EXTENDED_RELEASE_TABLET | Freq: Two times a day (BID) | ORAL | Status: DC
  Administered 2019-12-29 – 2020-01-02 (×9): 20 meq via ORAL
  Filled 2019-12-28 (×9): qty 1

## 2019-12-28 MED ORDER — ONDANSETRON HCL 4 MG/2ML IJ SOLN
4.0000 mg | Freq: Once | INTRAMUSCULAR | Status: AC
  Administered 2019-12-28: 20:00:00 4 mg via INTRAVENOUS
  Filled 2019-12-28: qty 2

## 2019-12-28 MED ORDER — CALCIUM CARBONATE-VITAMIN D 500-200 MG-UNIT PO TABS
1.0000 | ORAL_TABLET | Freq: Every day | ORAL | Status: DC
  Administered 2019-12-29 – 2020-01-02 (×4): 1 via ORAL
  Filled 2019-12-28 (×4): qty 1

## 2019-12-28 MED ORDER — ACETAMINOPHEN 650 MG RE SUPP: 650.0000 mg | Freq: Four times a day (QID) | RECTAL | Status: DC | PRN

## 2019-12-28 MED ORDER — HYDROMORPHONE HCL 1 MG/ML IJ SOLN
1.0000 mg | Freq: Once | INTRAMUSCULAR | Status: AC
  Administered 2019-12-28: 1 mg via INTRAVENOUS
  Filled 2019-12-28: qty 1

## 2019-12-28 MED ORDER — ONDANSETRON 4 MG PO TBDP: 4.0000 mg | ORAL_TABLET | Freq: Three times a day (TID) | ORAL | Status: DC | PRN

## 2019-12-28 MED ORDER — OXYCODONE HCL ER 10 MG PO T12A
20.0000 mg | EXTENDED_RELEASE_TABLET | Freq: Three times a day (TID) | ORAL | Status: DC
  Administered 2019-12-29 – 2020-01-01 (×12): 20 mg via ORAL
  Filled 2019-12-28 (×12): qty 2

## 2019-12-28 MED ORDER — PANTOPRAZOLE SODIUM 40 MG PO TBEC
40.0000 mg | DELAYED_RELEASE_TABLET | Freq: Every day | ORAL | Status: DC
  Administered 2019-12-29 – 2020-01-02 (×4): 40 mg via ORAL
  Filled 2019-12-28 (×4): qty 1

## 2019-12-28 MED ORDER — ALBUTEROL SULFATE (2.5 MG/3ML) 0.083% IN NEBU: 2.5000 mg | INHALATION_SOLUTION | RESPIRATORY_TRACT | Status: DC | PRN

## 2019-12-28 MED ORDER — FOLIC ACID 1 MG PO TABS
1.0000 mg | ORAL_TABLET | Freq: Every day | ORAL | Status: DC
  Administered 2019-12-29 – 2020-01-02 (×4): 1 mg via ORAL
  Filled 2019-12-28 (×4): qty 1

## 2019-12-28 MED ORDER — MORPHINE SULFATE (PF) 2 MG/ML IV SOLN
2.0000 mg | INTRAVENOUS | Status: DC | PRN
  Administered 2019-12-29 (×5): 2 mg via INTRAVENOUS
  Filled 2019-12-28 (×5): qty 1

## 2019-12-28 MED ORDER — METHOCARBAMOL 500 MG PO TABS
500.0000 mg | ORAL_TABLET | Freq: Four times a day (QID) | ORAL | Status: DC | PRN
  Administered 2019-12-29 – 2020-01-01 (×7): 500 mg via ORAL
  Filled 2019-12-28 (×7): qty 1

## 2019-12-28 MED ORDER — AMITRIPTYLINE HCL 25 MG PO TABS
100.0000 mg | ORAL_TABLET | Freq: Every day | ORAL | Status: DC
  Administered 2019-12-29 – 2020-01-01 (×5): 100 mg via ORAL
  Filled 2019-12-28: qty 2
  Filled 2019-12-28 (×4): qty 4

## 2019-12-28 MED ORDER — ONDANSETRON HCL 4 MG/2ML IJ SOLN
4.0000 mg | Freq: Four times a day (QID) | INTRAMUSCULAR | Status: DC | PRN
  Administered 2019-12-29 – 2019-12-30 (×3): 4 mg via INTRAVENOUS
  Filled 2019-12-28 (×3): qty 2

## 2019-12-28 NOTE — ED Triage Notes (Signed)
Pt BIBA with c/o right leg pain, swelling and paresthesia since surgery on Wednesday.  Per EMS pt was febrile and tachycardic en route as well.  Pt assisted to ED gurney via draw sheet method.  Placed on bedside monitor GCS 15, team at bedside for assesssment.

## 2019-12-28 NOTE — H&P (Signed)
[facility] [place] NAME: Joanna Hall    MR#:  [id number]  DATE OF BIRTH:  Jan 05, 1966  DATE OF ADMISSION:  12/28/2019  PRIMARY CARE PHYSICIAN: Towanda Malkin, MD   REQUESTING/REFERRING PHYSICIAN: Guerry Minors Charline Bills, PA-C CHIEF COMPLAINT:   Chief Complaint  Patient presents with  . Leg Pain    HISTORY OF PRESENT ILLNESS:  Joanna Hall  is a 54 y.o. 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 [name] and did fairly well to be discharged next day.  She continued to have right knee swelling that has been a gradual extending to her right leg over the last 3 days.  She was seen in the emergency room yesterday and had venous Doppler that showed no evidence for DVT above the knee but unfortunately the deep calf veins could not be evaluated.  She was noted to have increased edema and ecchymosis and from orthopedic standpoint they were considered expected postoperative changes.  She was discharged home to continue her Xarelto that was temporarily held perioperatively and continued upon her discharge.  She presents today with worsening right knee and leg pain and swelling with inability to move it without pain.  She admits to fever with a temperature of 100.5.  She has nausea and vomiting on a chronic basis with her metastatic cervical cancer and admits to diarrhea.  She has been having occasional dyspnea and wheezing.  No dysuria, oliguria or hematuria or flank pain.  Upon presentation to the emergency room, heart rate was 105 with otherwise normal vital signs.  Labs revealed mild hyponatremia and borderline potassium at 3.6.  AST was elevated at 26 [place] yesterday and ALT 58 with total bili of 1.4.  CK was 68 and lactic acid 0.8.   CBC showed hemoglobin of 9.2 hematocrit 28.2 comparable to yesterday.  Her INR was 1 with a PT of 12.9 yesterday and today INR was 2.7 and PT 20.7.  Knee x-ray today showed recent right knee arthroplasty with unchanged alignment, large joint effusion and increased joint effusion since immediate postoperative exam with generalized soft tissue swelling that is nonspecific.  Right tib-fib x-ray showed generalized soft tissue edema of the lower leg with no acute osseous abnormalities.  The patient was given Dilaudid 1 mg IV twice and 4 mg of IV Zofran.  She was ordered repeat lower extremity venous Doppler as well as an abdominal pelvic CT scan that are currently pending.  She will be admitted to a medical bed for further evaluation and management. PAST MEDICAL HISTORY:   Past Medical History:  Diagnosis Date  . Abdominal pain 06/10/2018  . Abnormal cervical Papanicolaou smear 09/18/2017  . Anxiety   . Aortic atherosclerosis ([place])   . Arthritis    neck and knees  . 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 Blood transfusion without reported diagnosis   . Cervical cancer ([place]) 09/2016   mets lung  . Chronic anal fissure   . Chronic diarrhea   . Dyspnea   . Erosive gastropathy 09/18/2017  . Factor V Leiden mutation ([place])   . Fecal incontinence   . Genital warts   . GERD (gastroesophageal reflux disease)   . GI bleed 10/08/2018  .  Heart murmur   . Hematochezia   . Hemorrhoids   . 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 Hepatitis, chronic ([place]) 05/05/2017  . History of cancer chemotherapy    completed 06/2017  . History of Clostridium difficile infection    while undergoing chemo.  Negative test 10/2017  . Ileocolic anastomotic leak   . 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 Intestinal infection due to Clostridium difficile 09/18/2017  . Macrocytic anemia with vitamin B12 deficiency   . Multiple gastric ulcers   . Nausea vomiting and  diarrhea   . Pancolitis ([place]) 07/27/2018  . Perianal condylomata   . Pneumonia    History of  . Pulmonary nodules   . Rectal bleeding   . Small bowel obstruction ([place]) 08/2017  . Stiff neck    limited right turn  . Vitamin D deficiency     PAST SURGICAL HISTORY:   Past Surgical History:  Procedure Laterality Date  . CHOLECYSTECTOMY    . COLON SURGERY  08/2017   resection  . COLONOSCOPY WITH PROPOFOL N/A 12/20/2017   Procedure: COLONOSCOPY WITH PROPOFOL;  Surgeon: [name], MD;  Location: [facility] ENDOSCOPY;  Service: Gastroenterology;  Laterality: N/A;  . COLONOSCOPY WITH PROPOFOL N/A 07/30/2018   Procedure: COLONOSCOPY WITH PROPOFOL;  Surgeon: [name], MD;  Location: [facility] ENDOSCOPY;  Service: Gastroenterology;  Laterality: N/A;  . COLONOSCOPY WITH PROPOFOL N/A 10/10/2018   Procedure: COLONOSCOPY WITH PROPOFOL;  Surgeon: Lucilla Lame, MD;  Location: [facility] ENDOSCOPY;  Service: Endoscopy;  Laterality: N/A;  . DIAGNOSTIC LAPAROSCOPY    . ESOPHAGOGASTRODUODENOSCOPY (EGD) WITH PROPOFOL N/A 12/20/2017   Procedure: ESOPHAGOGASTRODUODENOSCOPY (EGD) WITH PROPOFOL;  Surgeon: [name], MD;  Location: [place];  Service: Gastroenterology;  Laterality: N/A;  . ESOPHAGOGASTRODUODENOSCOPY (EGD) WITH PROPOFOL  07/30/2018   Procedure: ESOPHAGOGASTRODUODENOSCOPY (EGD) WITH PROPOFOL;  Surgeon: [name], MD;  Location: [facility] ENDOSCOPY;  Service: Gastroenterology;;  . Otho Darner SIGMOIDOSCOPY N/A 11/21/2018   Procedure: FLEXIBLE SIGMOIDOSCOPY;  Surgeon: [name], MD;  Location: [facility] ENDOSCOPY;  Service: Gastroenterology;  Laterality: N/A;  . LAPAROTOMY N/A 08/31/2017   Procedure: EXPLORATORY LAPAROTOMY for SBO, ileocolectomy, removal of piece of uterine wall;  Surgeon: [name], MD;  Location: [facility] ORS;  Service: General;  Laterality: N/A;  . LASER ABLATION CONDOLAMATA N/A 02/22/2018   Procedure: LASER ABLATION/REMOVAL OF MMNOTRRNHAF AROUND ANUS AND  VAGINA;  Surgeon: Michael Boston, MD;  Location: [place];  Service: General;  Laterality: N/A;  . OOPHORECTOMY    . PORTA CATH INSERTION N/A 05/13/2018   Procedure: PORTA CATH INSERTION;  Surgeon: Katha Cabal, MD;  Location: [place] CV LAB;  Service: Cardiovascular;  Laterality: N/A;  . SMALL INTESTINE SURGERY    . TANDEM RING INSERTION     x3  . THORACOTOMY    . TOTAL KNEE ARTHROPLASTY Left 04/24/2018   Procedure: TOTAL KNEE ARTHROPLASTY;  Surgeon: Lovell Sheehan, MD;  Location: [facility] ORS;  Service: Orthopedics;  Laterality: Left;  . TOTAL KNEE ARTHROPLASTY Right 12/24/2019   Procedure: RIGHT TOTAL KNEE ARTHROPLASTY;  Surgeon: Lovell Sheehan, MD;  Location: [facility] ORS;  Service: Orthopedics;  Laterality: Right;    SOCIAL HISTORY:   Social History   Tobacco Use  . Smoking status: Former Smoker    Packs/day: 0.25    Years: 10.00    Pack years: 2.50    Types: Cigarettes    Quit  date: 10/16/2006    Years since quitting: 13.2  . Smokeless tobacco: Never Used  Substance Use Topics  . Alcohol use: Not Currently    Comment: seldom    FAMILY HISTORY:   Family History  Problem Relation Age of Onset  . Hypertension Mother   . Hypertension Father   . Diabetes Father   . Hyperlipidemia Father   . Alcohol abuse Daughter   . Hypertension Maternal Grandmother   . Diabetes Maternal Grandmother   . Diabetes Paternal Grandmother   . Hypertension Paternal Grandmother     DRUG ALLERGIES:   Allergies  Allergen Reactions  . Ketamine Anxiety and Other (See Comments)    Syncope episode/confusion     REVIEW OF SYSTEMS:   ROS As per history of present illness. All pertinent systems were reviewed above. Constitutional,  HEENT, cardiovascular, respiratory, GI, GU, musculoskeletal, neuro, psychiatric, endocrine,  integumentary and hematologic systems were reviewed and are otherwise  negative/unremarkable except for positive findings mentioned above in the  HPI.   MEDICATIONS AT HOME:   Prior to Admission medications   Medication Sig Start Date End Date Taking? Authorizing Provider  amitriptyline (ELAVIL) 100 MG tablet Take 1 tablet (100 mg total) by mouth at bedtime. 11/10/19 02/08/20  [name], MD  Calcium Carb-Cholecalciferol (CALCIUM 500 +D) 500-400 MG-UNIT TABS Take 1 tablet by mouth daily.     [provider]  colchicine 0.6 MG tablet Take 0.6 mg by mouth daily.  06/19/19   [provider]  cyanocobalamin (,VITAMIN B-12,) 1000 MCG/ML injection Inject 1,000 mcg into the muscle every 21 ( twenty-one) days.  11/29/17   [provider]  dexamethasone (DECADRON) 4 MG tablet Take 4 mg by mouth See admin instructions. Take 4 mg twice daily for 3 days starting 3 days prior to chemo 08/01/19   [provider]  diphenoxylate-atropine (LOMOTIL) 2.5-0.025 MG tablet Take 2 tablets by mouth 2 (two) times daily as needed for diarrhea or loose stools. Patient not taking: Reported on 12/10/2019 09/29/19   Loletha Grayer, MD  DM-Phenylephrine-Acetaminophen (COLD RELIEF PO) Take 2 tablets by mouth daily as needed (cold symptoms).    [provider]  docusate sodium (COLACE) 100 MG capsule Take 1 capsule (100 mg total) by mouth 2 (two) times daily. 12/25/19   Carlynn Spry, PA-C  enoxaparin (LOVENOX) 120 MG/0.8ML injection Inject 0.8 mLs (120 mg total) into the skin daily. Patient not taking: Reported on 12/27/2019 12/11/19   Cammie Sickle, MD  famotidine (PEPCID) 20 MG tablet Take 1 tablet (20 mg total) by mouth 2 (two) times daily. 10/18/19   Carrie Mew, MD  folic acid (FOLVITE) 1 MG tablet Take 1 tablet (1 mg total) by mouth daily. 07/31/19   Cammie Sickle, MD  hydrocortisone (CORTEF) 10 MG tablet Take 1 tablet (10 mg total) by mouth 2 (two) times daily. 10/27/19 01/25/20  [name], MD  hydrOXYzine (VISTARIL) 25 MG capsule Take 1 capsule (25 mg total) by mouth 3 (three) times  daily as needed for itching. 12/15/19   Cammie Sickle, MD  hyoscyamine (LEVBID) 0.375 MG 12 hr tablet Take 1 tablet (0.375 mg total) by mouth 2 (two) times daily. 12/17/19   [name], MD  methocarbamol (ROBAXIN) 500 MG tablet Take 1 tablet (500 mg total) by mouth every 6 (six) hours as needed for muscle spasms. 12/25/19   Carlynn Spry, PA-C  Multiple Vitamins-Minerals (SUPER THERA VITE M) TABS Take 1 tablet by mouth daily.  [provider]  ondansetron (ZOFRAN ODT) 4 MG disintegrating tablet Take 1 tablet (4 mg total) by mouth every 8 (eight) hours as needed for nausea or vomiting. 10/18/19   Carrie Mew, MD  oxyCODONE (OXYCONTIN) 20 mg 12 hr tablet Take 1 tablet (20 mg total) by mouth every 8 (eight) hours. 12/03/19   Borders, Kirt Boys, NP  oxyCODONE 10 MG TABS Take 1 tablet (10 mg total) by mouth every 4 (four) hours as needed (for breakthrough pain). 12/25/19   Carlynn Spry, PA-C  Oxycodone HCl 10 MG TABS Take 1-2 tablets (10-20 mg total) by mouth every 6 (six) hours as needed (for breakthrough pain). Patient taking differently: Take 10 mg by mouth every 6 (six) hours as needed (for breakthrough pain).  12/11/19   Cammie Sickle, MD  pantoprazole (PROTONIX) 40 MG tablet Take 1 tablet (40 mg total) by mouth daily. 12/05/19   Cammie Sickle, MD  potassium chloride SA (KLOR-CON) 20 MEQ tablet Take 1 tablet (20 mEq total) by mouth 2 (two) times daily. 09/10/19   Verlon Au, NP  promethazine (PHENERGAN) 25 MG tablet Take 1 tablet (25 mg total) by mouth every 8 (eight) hours as needed for nausea or vomiting. 08/25/19   Cammie Sickle, MD  rivaroxaban (XARELTO) 20 MG TABS tablet Take 1 tablet (20 mg total) by mouth every morning. Takes in the morning Patient not taking: Reported on 12/22/2019 09/29/19   Loletha Grayer, MD  sertraline (ZOLOFT) 25 MG tablet Take 1 tablet (25 mg total) by mouth daily. 09/29/19   Loletha Grayer, MD  triamcinolone  ointment (KENALOG) 0.5 % Apply 1 application topically 2 (two) times daily. Patient taking differently: Apply 1 application topically 2 (two) times daily as needed (itching).  11/20/19   Jacquelin Hawking, NP  VENTOLIN HFA 108 (90 Base) MCG/ACT inhaler Inhale 1-2 puffs into the lungs every 4 (four) hours as needed for shortness of breath. 02/19/19   Verlon Au, NP      VITAL SIGNS:  Blood pressure 137/82, pulse (!) 105, temperature 98.4 F (36.9 C), temperature source Oral, resp. rate 18, height 5' 7.5" (1.715 m), weight 78 kg, SpO2 99 %.  PHYSICAL EXAMINATION:  Physical Exam  GENERAL:  53 y.o.-year-old Caucasian female patient lying in the bed with no mild distress from pain. EYES: Pupils equal, round, reactive to light and accommodation. No scleral icterus. Extraocular muscles intact.  HEENT: Head atraumatic, normocephalic. Oropharynx and nasopharynx clear.  NECK:  Supple, no jugular venous distention. No thyroid enlargement, no tenderness.  LUNGS: Normal breath sounds bilaterally, no wheezing, rales,rhonchi or crepitation. No use of accessory muscles of respiration.  CARDIOVASCULAR: Regular rate and rhythm, S1, S2 normal. No murmurs, rubs, or gallops.  ABDOMEN: Soft, nondistended, nontender. Bowel sounds present. No organomegaly or mass.  EXTREMITIES: No pitting lower extremity edema, cyanosis, or clubbing. Musculoskeletal: Right knee swelling with associated tenderness mild erythema and ecchymosis extending to the right thigh with right leg swelling and mild associated tenderness up to mid calf, as shown below.  She has significant pain on range of motion. NEUROLOGIC: Cranial nerves II through XII are intact. Muscle strength 5/5 in all extremities. Sensation intact. Gait not checked.  PSYCHIATRIC: The patient is alert and oriented x 3.  Normal affect and good eye contact. SKIN: As above otherwise no other obvious rash, lesion, or ulcer.     LABORATORY PANEL:   CBC Recent Labs    Lab 12/28/19 1724  WBC 5.6  HGB  9.2*  HCT 28.2*  PLT 286   ------------------------------------------------------------------------------------------------------------------  Chemistries  Recent Labs  Lab 12/22/19 1032 12/25/19 0405 12/28/19 1724  NA 134*   < > 132*  K 3.4*   < > 3.6  CL 100   < > 96*  CO2 23   < > 26  GLUCOSE 101*   < > 71  BUN 11   < > 10  CREATININE 1.32*   < > 0.87  CALCIUM 9.5   < > 8.5*  MG 1.5*  --   --   AST  --    < > 79*  ALT  --    < > 58*  ALKPHOS  --    < > 241*  BILITOT  --    < > 1.4*   < > = values in this interval not displayed.   ------------------------------------------------------------------------------------------------------------------  Cardiac Enzymes No results for input(s): TROPONINI in the last 168 hours. ------------------------------------------------------------------------------------------------------------------  RADIOLOGY:  DG Tibia/Fibula Right  Result Date: 12/28/2019 CLINICAL DATA:  Postop pain, edema, and ecchymosis. Surgery on Wednesday, 4 days ago. EXAM: RIGHT TIBIA AND FIBULA - 2 VIEW COMPARISON:  None. FINDINGS: Right knee arthroplasty in expected alignment. No periprosthetic lucency. Remainder of the tibia is intact. Fibula is normal. No periosteal reaction or bony destruction. Generalized soft tissue edema. Anterior skin staples about the knee. Calcifications in the soft tissues anteromedially are likely phleboliths. IMPRESSION: Generalized soft tissue edema of the lower leg. No acute osseous abnormality. Electronically Signed   By: Keith Rake M.D.   On: 12/28/2019 19:48   US Venous Img Lower Unilateral Right  Result Date: 12/27/2019 CLINICAL DATA:  54 year old with right leg swelling for 2 days. Recent knee replacement. EXAM: RIGHT LOWER EXTREMITY VENOUS DOPPLER ULTRASOUND TECHNIQUE: Gray-scale sonography with graded compression, as well as color Doppler and duplex ultrasound were performed to evaluate  the lower extremity deep venous systems from the level of the common femoral vein and including the common femoral, femoral, profunda femoral, popliteal and calf veins including the posterior tibial, peroneal and gastrocnemius veins when visible. The superficial great saphenous vein was also interrogated. Spectral Doppler was utilized to evaluate flow at rest and with distal augmentation maneuvers in the common femoral, femoral and popliteal veins. COMPARISON:  None. FINDINGS: Contralateral Common Femoral Vein: Respiratory phasicity is normal and symmetric with the symptomatic side. No evidence of thrombus. Normal compressibility. Common Femoral Vein: No evidence of thrombus. Normal compressibility, respiratory phasicity and response to augmentation. Saphenofemoral Junction: No evidence of thrombus. Normal compressibility and flow on color Doppler imaging. Profunda Femoral Vein: No evidence of thrombus. Normal compressibility and flow on color Doppler imaging. Femoral Vein: No evidence of thrombus. Normal compressibility, respiratory phasicity and response to augmentation. Popliteal Vein: No evidence of thrombus. Normal compressibility, respiratory phasicity and response to augmentation. Calf Veins: Due to leg edema, the calf veins cannot be confidently identified or evaluated on this examination. Limited Doppler flow in some of the tibial veins. Other Findings:  None. IMPRESSION: No evidence for deep venous thrombosis in the right leg above the knee. Unfortunately, the deep calf veins cannot be evaluated and cannot exclude DVT in the deep calf veins. Electronically Signed   By: Markus Daft M.D.   On: 12/27/2019 11:07   DG Knee Complete 4 Views Right  Result Date: 12/28/2019 CLINICAL DATA:  Postop pain, edema, and ecchymosis. Surgery on Wednesday, 4 days ago. EXAM: RIGHT KNEE - COMPLETE 4+ VIEW COMPARISON:  Knee radiograph 12/24/2019 FINDINGS: Right  knee arthroplasty in unchanged alignment. No periprosthetic  lucency or fracture. There is been patellar resurfacing. Large joint effusion, increased joint effusion since immediate postoperative exam. Previous intra-articular air has resolved. There are anterior skin staples in place with generalized soft tissue edema. IMPRESSION: Recent right knee arthroplasty in unchanged alignment. Large joint effusion, increased joint effusion since immediate postoperative exam. Generalized soft tissue edema, nonspecific. Electronically Signed   By: Keith Rake M.D.   On: 12/28/2019 19:47      IMPRESSION AND PLAN:   1.  Intractable right knee and leg pain, status post right total knee arthroplasty with worsening postoperative right knee effusion. -The patient will be placed in an observation medical bed. -Pain management to be provided with as needed IV morphine sulfate in addition to her home pain medications. -An orthopedic evaluation will be obtained.  I notified Dr. Roland Rack about the patient. -Repeat right lower extremity venous Doppler and abdominal pelvic CT scan are currently pending.  2.  Intractable nausea and vomiting. -This could certainly be related to narcotic intake in the ER. -She has it intermittently with her stage IV cervical cancer. -Abdominal and pelvic CT scan is currently pending.  3.  Factor V Leiden mutation with history of thromboembolism including PE, embolic CVA and right kidney thrombosis. -We will continue Xarelto.  Her INR is currently therapeutic that makes the possibility of DVT fairly remote it was subtherapeutic yesterday.  4.  Stage IV cervical cancer with lung diastasis. -Pain management will be provided with continued home medications. -Her steroid therapy will be continued.  5.  Vitamin B12 deficiency. -She gets vitamin B12 every 3 weeks.  6.  DVT prophylaxis. -We will continue Xarelto.  She has therapeutic INR.   All the records are reviewed and case discussed with ED provider. The plan of care was discussed in  details with the patient (and family). I answered all questions. The patient agreed to proceed with the above mentioned plan. Further management will depend upon hospital course.   CODE STATUS: The patient is DNR/DNI.  TOTAL TIME TAKING CARE OF THIS PATIENT: 55 minutes.    Christel Mormon M.D on 12/28/2019 at 9:47 PM  [facility]   From 7 PM-7 AM, contact night-coverage [email or web address]  CC: Primary care physician; Towanda Malkin, MD   Note: This dictation was prepared with Dragon dictation along with smaller phrase technology. Any transcriptional errors that result from this process are unintentional.

## 2019-12-28 NOTE — ED Notes (Signed)
Pt provided water to drink, ok with PA.

## 2019-12-28 NOTE — ED Provider Notes (Signed)
[facility] Emergency Department Provider Note  ____________________________________________  Time seen: Approximately 8:01 PM  I have reviewed the triage vital signs and the nursing notes.   HISTORY  Chief Complaint Leg Pain    HPI Joanna Hall is a 54 y.o. female who presents the emergency department for assessment of worsening right lower leg pain, edema and ecchymosis.  Patient had total knee replacement performed by Dr. [name].  Patient states that the day after her surgery which was on Wednesday, she had minimal pain, was ambulatory.  Patient then developed worsening pain, edema was seen at orthopedist office on the following day.  Exam at that time was reassuring and patient was sent home.  Patient returned yesterday to the emergency department for evaluation over concern of a blood clot as patient had increasing edema, pain specifically in the calf.  Yesterday ultrasound was inconclusive as it was not able to visualize the lower extremity vessels given the amount of edema.  After discussions with orthopedics, it was felt that patient was safe to restart her Xarelto.  Patient returns to the emergency department tonight with significant concern for worsening pain, worsening ecchymosis.  Patient is taking pain medications chronically for stage IV cervical cancer.  Patient states that her pain meds have not been alleviating the increased pain to her right lower extremity.  Patient is tried to ambulate but states that the pain is too severe and she cannot put any weight on the right leg.  Patient denies any other complaints of headache, vision changes, chest pain, shortness of breath, cough, abdominal pain, nausea or vomiting.  Patient has a history of atherosclerosis, history of PEs, cervical cancer with metastasis to the lung, chronic shortness of breath, GERD, chronic hepatitis C, hypertension.         Past Medical History:  Diagnosis Date  . Abdominal pain  06/10/2018  . Abnormal cervical Papanicolaou smear 09/18/2017  . Anxiety   . Aortic atherosclerosis ([place])   . Arthritis    neck and knees  . 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 Blood transfusion without reported diagnosis   . Cervical cancer ([place]) 09/2016   mets lung  . Chronic anal fissure   . Chronic diarrhea   . Dyspnea   . Erosive gastropathy 09/18/2017  . Factor V Leiden mutation ([place])   . Fecal incontinence   . Genital warts   . GERD (gastroesophageal reflux disease)   . GI bleed 10/08/2018  . Heart murmur   . Hematochezia   . Hemorrhoids   . 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 Hepatitis, chronic ([place]) 05/05/2017  . History of cancer chemotherapy    completed 06/2017  . History of Clostridium difficile infection    while undergoing chemo.  Negative test 10/2017  . Ileocolic anastomotic leak   . Infarction of kidney ([place]) left kidney   and uterus  . Intestinal infection due to Clostridium difficile 09/18/2017  . Macrocytic anemia with vitamin B12 deficiency   . Multiple gastric ulcers   . Nausea vomiting and diarrhea   . Pancolitis ([place]) 07/27/2018  . Perianal condylomata   . Pneumonia    History of  . Pulmonary nodules   . Rectal bleeding   . Small bowel obstruction ([place]) 08/2017  . Stiff neck    limited right turn  . Vitamin D deficiency     Patient Active Problem List  Diagnosis Date Noted  . Leg pain 12/28/2019  . History of total knee arthroplasty, right 12/24/2019  . Moderate episode of recurrent major depressive disorder ([place]) 11/13/2019  . Acute colitis 09/25/2019  . Anxiety and depression   . Thrombocytopenia ([place]) 08/17/2019  . Intractable lower abdominal pain   . Abdominal pain 07/23/2019  . Constipation 07/22/2019  . Malnutrition of moderate degree 06/22/2019  . Small bowel obstruction due to adhesions ([place]) 06/21/2019  . Acute gastroenteritis 06/14/2019  . Intractable nausea and vomiting  06/07/2019  . Chest pain 05/19/2019  . Intractable pain 05/19/2019  . Cancer associated pain   . Proctitis, radiation   . Palliative care encounter   . Encounter for monitoring opioid maintenance therapy 11/19/2018  . Adrenal insufficiency ([place]) 09/03/2018  . Nausea vomiting and diarrhea   . Chronic diarrhea 06/25/2018  . Chronic anticoagulation 06/25/2018  . Vomiting   . Encounter for antineoplastic chemotherapy 06/04/2018  . 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 Lung metastasis ([place]) 05/15/2018  . Closed fracture of distal end of radius 05/04/2018  . History of total knee arthroplasty 04/24/2018  . Osteoarthritis of left knee 02/28/2018  . Condyloma acuminatum of anus s/p ablation 02/22/2018 02/22/2018  . Condyloma acuminatum of vagina s/p ablation 02/22/2018 02/22/2018  . Positive ANA (antinuclear antibody) 02/04/2018  . Aortic atherosclerosis ([place]) 01/15/2018  . Elevated MCV 01/15/2018  . Anemia 01/15/2018  . Genital warts 01/15/2018  . Vitamin D deficiency 01/15/2018  . Pernicious anemia   . Impingement syndrome of shoulder region 12/19/2017  . Multiple joint pain 12/19/2017  . Osteoarthritis of right knee 12/19/2017  . B12 deficiency 12/11/2017  . Lung nodules 12/11/2017  . History of Clostridium difficile infection 10/23/2017  . Factor V Leiden ([place]) 10/19/2017  . Goals of care, counseling/discussion 10/19/2017  . Cervical cancer, FIGO stage IB1 ([place]) 09/18/2017  . Chronic hepatitis C without hepatic coma ([place]) 09/18/2017  . Cytopenia 09/18/2017  . Diarrhea 09/18/2017  . Elevated LFTs 09/18/2017  . Lower abdominal pain 09/18/2017  . 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 Malignant neoplasm of overlapping sites of cervix ([place]) 09/18/2017  . Renal insufficiency 09/18/2017  . Wound infection after surgery 09/12/2017  . Hypokalemia   . Hypomagnesemia   . Cervical arthritis 07/18/2017  . Dysuria 06/20/2017  . Metastasis from cervical cancer ([place]) 05/12/2017  .  Essential hypertension 03/15/2017  . Anemia of chronic disease 03/01/2017  . Chemotherapy-induced neutropenia ([place]) 01/29/2017  . 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[place]) 09/25/2016    Past Surgical History:  Procedure Laterality Date  . CHOLECYSTECTOMY    . COLON SURGERY  08/2017   resection  . COLONOSCOPY WITH PROPOFOL N/A 12/20/2017   Procedure: COLONOSCOPY WITH PROPOFOL;  Surgeon: [name], MD;  Location[facility] ENDOSCOPY;  Service: Gastroenterology;  Laterality: N/A;  . COLONOSCOPY WITH PROPOFOL N/A 07/30/2018   Procedure: COLONOSCOPY WITH PROPOFOL;  Surgeon: [name], MD;  Location: [facility] ENDOSCOPY;  Service: Gastroenterology;  Laterality: N/A;  . COLONOSCOPY WITH PROPOFOL N/A 10/10/2018   Procedure: COLONOSCOPY WITH PROPOFOL;  Surgeon: Lucilla Lame, MD;  Location: [facility]OSCOPY;  Service: Endoscopy;  Laterality: N/A;  . DIAGNOSTIC LAPAROSCOPY    . ESOPHAGOGASTRODUODENOSCOPY (EGD) WITH PROPOFOL N/A 12/20/2017   Procedure: ESOPHAGOGASTRODUODENOSCOPY (EGD) WITH PROPOFOL;  Surgeon: [name], MD;  Location: [place];  Service: Gastroenterology;  Laterality: N/A;  . ESOPHAGOGASTRODUODENOSCOPY (EGD) WITH PROPOFOL  07/30/2018   Procedure: ESOPHAGOGASTRODUODENOSCOPY (EGD) WITH PROPOFOL;  Surgeon: [name], MD;  Location: [place];  Service:  Gastroenterology;;  . Otho Darner SIGMOIDOSCOPY N/A 11/21/2018   Procedure: FLEXIBLE SIGMOIDOSCOPY;  Surgeon: [name], MD;  Location: [facility] ENDOSCOPY;  Service: Gastroenterology;  Laterality: N/A;  . LAPAROTOMY N/A 08/31/2017   Procedure: EXPLORATORY LAPAROTOMY for SBO, ileocolectomy, removal of piece of uterine wall;  Surgeon: [name], MD;  Location: [facility] ORS;  Service: General;  Laterality: N/A;  . LASER ABLATION CONDOLAMATA N/A 02/22/2018   Procedure: LASER ABLATION/REMOVAL OF FOYDXAJOINO AROUND ANUS AND VAGINA;  Surgeon: Michael Boston, MD;  Location: [place];  Service:  General;  Laterality: N/A;  . OOPHORECTOMY    . PORTA CATH INSERTION N/A 05/13/2018   Procedure: PORTA CATH INSERTION;  Surgeon: Katha Cabal, MD;  Location: [place] CV LAB;  Service: Cardiovascular;  Laterality: N/A;  . SMALL INTESTINE SURGERY    . TANDEM RING INSERTION     x3  . THORACOTOMY    . TOTAL KNEE ARTHROPLASTY Left 04/24/2018   Procedure: TOTAL KNEE ARTHROPLASTY;  Surgeon: Lovell Sheehan, MD;  Location: [facility] ORS;  Service: Orthopedics;  Laterality: Left;  . TOTAL KNEE ARTHROPLASTY Right 12/24/2019   Procedure: RIGHT TOTAL KNEE ARTHROPLASTY;  Surgeon: Lovell Sheehan, MD;  Location: [facility] ORS;  Service: Orthopedics;  Laterality: Right;    Prior to Admission medications   Medication Sig Start Date End Date Taking? Authorizing Provider  amitriptyline (ELAVIL) 100 MG tablet Take 1 tablet (100 mg total) by mouth at bedtime. 11/10/19 02/08/20  [name], MD  Calcium Carb-Cholecalciferol (CALCIUM 500 +D) 500-400 MG-UNIT TABS Take 1 tablet by mouth daily.     [provider]  colchicine 0.6 MG tablet Take 0.6 mg by mouth daily.  06/19/19   [provider]  cyanocobalamin (,VITAMIN B-12,) 1000 MCG/ML injection Inject 1,000 mcg into the muscle every 21 ( twenty-one) days.  11/29/17   [provider]  dexamethasone (DECADRON) 4 MG tablet Take 4 mg by mouth See admin instructions. Take 4 mg twice daily for 3 days starting 3 days prior to chemo 08/01/19   [provider]  diphenoxylate-atropine (LOMOTIL) 2.5-0.025 MG tablet Take 2 tablets by mouth 2 (two) times daily as needed for diarrhea or loose stools. Patient not taking: Reported on 12/10/2019 09/29/19   Loletha Grayer, MD  DM-Phenylephrine-Acetaminophen (COLD RELIEF PO) Take 2 tablets by mouth daily as needed (cold symptoms).    [provider]  docusate sodium (COLACE) 100 MG capsule Take 1 capsule (100 mg total) by mouth 2 (two) times daily. 12/25/19   Carlynn Spry, PA-C   enoxaparin (LOVENOX) 120 MG/0.8ML injection Inject 0.8 mLs (120 mg total) into the skin daily. Patient not taking: Reported on 12/27/2019 12/11/19   Cammie Sickle, MD  famotidine (PEPCID) 20 MG tablet Take 1 tablet (20 mg total) by mouth 2 (two) times daily. 10/18/19   Carrie Mew, MD  folic acid (FOLVITE) 1 MG tablet Take 1 tablet (1 mg total) by mouth daily. 07/31/19   Cammie Sickle, MD  hydrocortisone (CORTEF) 10 MG tablet Take 1 tablet (10 mg total) by mouth 2 (two) times daily. 10/27/19 01/25/20  [name], MD  hydrOXYzine (VISTARIL) 25 MG capsule Take 1 capsule (25 mg total) by mouth 3 (three) times daily as needed for itching. 12/15/19   Cammie Sickle, MD  hyoscyamine (LEVBID) 0.375 MG 12 hr tablet Take 1 tablet (0.375 mg total) by mouth 2 (two) times daily. 12/17/19   [name], MD  methocarbamol (ROBAXIN) 500 MG tablet Take  1 tablet (500 mg total) by mouth every 6 (six) hours as needed for muscle spasms. 12/25/19   Carlynn Spry, PA-C  Multiple Vitamins-Minerals (SUPER THERA VITE M) TABS Take 1 tablet by mouth daily.     [provider]  ondansetron (ZOFRAN ODT) 4 MG disintegrating tablet Take 1 tablet (4 mg total) by mouth every 8 (eight) hours as needed for nausea or vomiting. 10/18/19   Carrie Mew, MD  oxyCODONE (OXYCONTIN) 20 mg 12 hr tablet Take 1 tablet (20 mg total) by mouth every 8 (eight) hours. 12/03/19   Borders, Kirt Boys, NP  oxyCODONE 10 MG TABS Take 1 tablet (10 mg total) by mouth every 4 (four) hours as needed (for breakthrough pain). 12/25/19   Carlynn Spry, PA-C  Oxycodone HCl 10 MG TABS Take 1-2 tablets (10-20 mg total) by mouth every 6 (six) hours as needed (for breakthrough pain). Patient taking differently: Take 10 mg by mouth every 6 (six) hours as needed (for breakthrough pain).  12/11/19   Cammie Sickle, MD  pantoprazole (PROTONIX) 40 MG tablet Take 1 tablet (40 mg total) by mouth daily. 12/05/19    Cammie Sickle, MD  potassium chloride SA (KLOR-CON) 20 MEQ tablet Take 1 tablet (20 mEq total) by mouth 2 (two) times daily. 09/10/19   Verlon Au, NP  promethazine (PHENERGAN) 25 MG tablet Take 1 tablet (25 mg total) by mouth every 8 (eight) hours as needed for nausea or vomiting. 08/25/19   Cammie Sickle, MD  rivaroxaban (XARELTO) 20 MG TABS tablet Take 1 tablet (20 mg total) by mouth every morning. Takes in the morning Patient not taking: Reported on 12/22/2019 09/29/19   Loletha Grayer, MD  sertraline (ZOLOFT) 25 MG tablet Take 1 tablet (25 mg total) by mouth daily. 09/29/19   Loletha Grayer, MD  triamcinolone ointment (KENALOG) 0.5 % Apply 1 application topically 2 (two) times daily. Patient taking differently: Apply 1 application topically 2 (two) times daily as needed (itching).  11/20/19   Jacquelin Hawking, NP  VENTOLIN HFA 108 (90 Base) MCG/ACT inhaler Inhale 1-2 puffs into the lungs every 4 (four) hours as needed for shortness of breath. 02/19/19   Verlon Au, NP    Allergies Ketamine  Family History  Problem Relation Age of Onset  . Hypertension Mother   . Hypertension Father   . Diabetes Father   . Hyperlipidemia Father   . Alcohol abuse Daughter   . Hypertension Maternal Grandmother   . Diabetes Maternal Grandmother   . Diabetes Paternal Grandmother   . Hypertension Paternal Grandmother     Social History Social History   Tobacco Use  . Smoking status: Former Smoker    Packs/day: 0.25    Years: 10.00    Pack years: 2.50    Types: Cigarettes    Quit date: 10/16/2006    Years since quitting: 13.2  . Smokeless tobacco: Never Used  Substance Use Topics  . Alcohol use: Not Currently    Comment: seldom  . Drug use: Yes    Types: Marijuana    Comment: not very often     Review of Systems  Constitutional: No fever/chills Eyes: No visual changes. No discharge ENT: No upper respiratory complaints. Cardiovascular: no chest  pain. Respiratory: no cough. No SOB. Gastrointestinal: No abdominal pain.  No nausea, no vomiting.  No diarrhea.  No constipation. Musculoskeletal: Increasing right lower leg pain, edema, ecchymosis following total knee replacement 4 days ago Skin: Negative for rash, abrasions,  lacerations, ecchymosis. Neurological: Negative for headaches, focal weakness or numbness. 10-point ROS otherwise negative.  ____________________________________________   PHYSICAL EXAM:  VITAL SIGNS: ED Triage Vitals  Enc Vitals Group     BP 12/28/19 1730 119/79     Pulse Rate 12/28/19 1730 (!) 105     Resp 12/28/19 1735 20     Temp 12/28/19 1735 97.7 F (36.5 C)     Temp Source 12/28/19 1735 Oral     SpO2 12/28/19 1730 98 %     Weight 12/28/19 1737 171 lb 15.3 oz (78 kg)     Height 12/28/19 1737 5' 7.5" (1.715 m)     Head Circumference --      Peak Flow --      Pain Score 12/28/19 1736 10     Pain Loc --      Pain Edu? --      Excl. in [place]? --      Constitutional: Alert and oriented. Well appearing and in no acute distress. Eyes: Conjunctivae are normal. PERRL. EOMI. Head: Atraumatic. ENT:      Ears:       Nose: No congestion/rhinnorhea.      Mouth/Throat: Mucous membranes are moist.  Neck: No stridor.   Cardiovascular: Normal rate, regular rhythm. Normal S1 and S2.  Good peripheral circulation. Respiratory: Normal respiratory effort without tachypnea or retractions. Lungs CTAB. Good air entry to the bases with no decreased or absent breath sounds. Musculoskeletal: Full range of motion to all extremities. No gross deformities appreciated.  Visualization of the right lower extremity reveals surgical wound to the anterior knee consistent with total knee replacement.  No evidence of surrounding cellulitis or dehiscence.  Limited range of motion to the right lower extremity due to pain.  Patient has significant edema, ecchymosis extending from her thigh into her ankle.  Patient has diffuse tenderness  to palpation starting mid thigh into the lower extremity.  Compartments are soft at this time.  With coaxing patient is able to move the ankle joint and all digits right foot appropriately.  Dorsalis pedis pulses appreciated and equal to unaffected extremity.  Sensation intact all 5 digits.  Capillary refill intact all digits. Neurologic:  Normal speech and language. No gross focal neurologic deficits are appreciated.  Skin:  Skin is warm, dry and intact. No rash noted. Psychiatric: Mood and affect are normal. Speech and behavior are normal. Patient exhibits appropriate insight and judgement.   ____________________________________________   LABS (all labs ordered are listed, but only abnormal results are displayed)  Labs Reviewed  COMPREHENSIVE METABOLIC PANEL - Abnormal; Notable for the following components:      Result Value   Sodium 132 (*)    Chloride 96 (*)    Calcium 8.5 (*)    Albumin 3.1 (*)    AST 79 (*)    ALT 58 (*)    Alkaline Phosphatase 241 (*)    Total Bilirubin 1.4 (*)    All other components within normal limits  CBC WITH DIFFERENTIAL/PLATELET - Abnormal; Notable for the following components:   RBC 2.81 (*)    Hemoglobin 9.2 (*)    HCT 28.2 (*)    MCV 100.4 (*)    All other components within normal limits  PROTIME-INR - Abnormal; Notable for the following components:   Prothrombin Time 28.5 (*)    INR 2.7 (*)    All other components within normal limits  SARS CORONAVIRUS 2 (TAT 6-24 HRS)  LACTIC ACID, PLASMA  CK  BASIC METABOLIC PANEL  CBC   ____________________________________________  EKG   ____________________________________________  RADIOLOGY I personally viewed and evaluated these images as part of my medical decision making, as well as reviewing the written report by the radiologist.  DG Tibia/Fibula Right  Result Date: 12/28/2019 CLINICAL DATA:  Postop pain, edema, and ecchymosis. Surgery on Wednesday, 4 days ago. EXAM: RIGHT TIBIA AND  FIBULA - 2 VIEW COMPARISON:  None. FINDINGS: Right knee arthroplasty in expected alignment. No periprosthetic lucency. Remainder of the tibia is intact. Fibula is normal. No periosteal reaction or bony destruction. Generalized soft tissue edema. Anterior skin staples about the knee. Calcifications in the soft tissues anteromedially are likely phleboliths. IMPRESSION: Generalized soft tissue edema of the lower leg. No acute osseous abnormality. Electronically Signed   By: Keith Rake M.D.   On: 12/28/2019 19:48   DG Knee Complete 4 Views Right  Result Date: 12/28/2019 CLINICAL DATA:  Postop pain, edema, and ecchymosis. Surgery on Wednesday, 4 days ago. EXAM: RIGHT KNEE - COMPLETE 4+ VIEW COMPARISON:  Knee radiograph 12/24/2019 FINDINGS: Right knee arthroplasty in unchanged alignment. No periprosthetic lucency or fracture. There is been patellar resurfacing. Large joint effusion, increased joint effusion since immediate postoperative exam. Previous intra-articular air has resolved. There are anterior skin staples in place with generalized soft tissue edema. IMPRESSION: Recent right knee arthroplasty in unchanged alignment. Large joint effusion, increased joint effusion since immediate postoperative exam. Generalized soft tissue edema, nonspecific. Electronically Signed   By: Keith Rake M.D.   On: 12/28/2019 19:47    ____________________________________________    PROCEDURES  Procedure(s) performed:    Procedures    Medications  HYDROmorphone (DILAUDID) injection 1 mg (1 mg Intravenous Given 12/28/19 2112)  colchicine tablet 0.6 mg (has no administration in time range)  oxyCODONE (OXYCONTIN) 12 hr tablet 20 mg (has no administration in time range)  Oxycodone HCl TABS 10 mg (has no administration in time range)  amitriptyline (ELAVIL) tablet 100 mg (has no administration in time range)  hydrOXYzine (VISTARIL) capsule 25 mg (has no administration in time range)  sertraline (ZOLOFT)  tablet 25 mg (has no administration in time range)  hydrocortisone (CORTEF) tablet 10 mg (has no administration in time range)  dexamethasone (DECADRON) tablet 4 mg (has no administration in time range)  docusate sodium (COLACE) capsule 100 mg (has no administration in time range)  famotidine (PEPCID) tablet 20 mg (has no administration in time range)  hyoscyamine (LEVBID) 0.375 MG 12 hr tablet 0.375 mg (has no administration in time range)  ondansetron (ZOFRAN-ODT) disintegrating tablet 4 mg (has no administration in time range)  pantoprazole (PROTONIX) EC tablet 40 mg (has no administration in time range)  cyanocobalamin ((VITAMIN B-12)) injection 1,000 mcg (has no administration in time range)  folic acid (FOLVITE) tablet 1 mg (has no administration in time range)  rivaroxaban (XARELTO) tablet 20 mg (has no administration in time range)  methocarbamol (ROBAXIN) tablet 500 mg (has no administration in time range)  Calcium Carb-Cholecalciferol 500-400 MG-UNIT TABS 1 tablet (has no administration in time range)  Super [place]ra Vite M TABS 1 tablet (has no administration in time range)  potassium chloride SA (KLOR-CON) CR tablet 20 mEq (has no administration in time range)  albuterol (VENTOLIN HFA) 108 (90 Base) MCG/ACT inhaler 1-2 puff (has no administration in time range)  0.9 %  sodium chloride infusion (has no administration in time range)  acetaminophen (TYLENOL) tablet 650 mg (has no administration in time range)    Or  acetaminophen (  TYLENOL) suppository 650 mg (has no administration in time range)  traZODone (DESYREL) tablet 25 mg (has no administration in time range)  magnesium hydroxide (MILK OF MAGNESIA) suspension 30 mL (has no administration in time range)  ondansetron (ZOFRAN) tablet 4 mg (has no administration in time range)    Or  ondansetron (ZOFRAN) injection 4 mg (has no administration in time range)  morphine 2 MG/ML injection 2 mg (has no administration in time range)   HYDROmorphone (DILAUDID) injection 1 mg (1 mg Intravenous Given 12/28/19 1938)  ondansetron (ZOFRAN) injection 4 mg (4 mg Intravenous Given 12/28/19 1938)     ____________________________________________   INITIAL IMPRESSION / ASSESSMENT AND PLAN / ED COURSE  Pertinent labs & imaging results that were available during my care of the patient were reviewed by me and considered in my medical decision making (see chart for details).  Review of the  [place]RS was performed in accordance of the [place] prior to dispensing any controlled drugs.           Patient's diagnosis is consistent with leg pain, leg edema, ecchymosis to the right leg.  Patient had total knee replacement 4 days ago.  The day after surgery patient was doing well, ambulating with the assistance of a walker.  Patient had worsening pain on the next day, was seen by her orthopedic surgeon and determined that surgical site had no evidence of infection, pain, edema, ecchymosis was consistent with postsurgical changes.  Patient had continuing edema and ecchymosis and was referred to the emergency department for evaluation for DVT.  She was seen yesterday.  Ultrasound was unable to visualize vasculature in the calf.  After discussing the patient with orthopedics it was felt the patient was safe to restart her Xarelto.  Patient was restarted yesterday.  Patient was concerned as she had increasing pain, edema and ecchymosis when compared with yesterday.  On exam, there were no superficial findings concerning for cellulitis.  No findings on x-ray consistent with osteomyelitis.  Labs are reassuring.  I did not repeat the ultrasound as with worsening edema it was unlikely that patient would have better visualization of calf veins and patient had already restarted her Xarelto.  At this time, given patient's inability to bear weight, worsening pain, as well as other medical history I feel that patient would be reasonable to be admitted for pain  control..  I will discuss the patient with the hospitalist service.  ----------------------------------------- 9:51 PM on 12/28/2019 -----------------------------------------  Patient will be admitted to the hospital service.  Given the worsening edema, ecchymosis, hospitalist was concern for extending DVT.  He recommended discussing the patient with radiology to see if there was another modality to further evaluate for DVT.  Radiologist recommends repeating ultrasound, though he also suspects this will likely not result in significantly better chances of determining DVT status than yesterday.  Radiologist also recommends CT abdomen and pelvis to ensure no extension of the DVT into the abdominal region.  Hospitalist request that we order both, they have been placed at this time.  Hospitalist will admit patient.   ____________________________________________  FINAL CLINICAL IMPRESSION(S) / ED DIAGNOSES  Final diagnoses:  Right leg pain  Leg edema, right      NEW MEDICATIONS STARTED DURING THIS VISIT:  ED Discharge Orders    None          This chart was dictated using voice recognition software/Dragon. Despite best efforts to proofread, errors can occur which can change the meaning. Any change was  purely unintentional.    Darletta Moll, PA-C 12/28/19 2152    Lilia Pro., MD 12/29/19 [phone]

## 2019-12-28 NOTE — ED Notes (Signed)
Sec called for bed assignment - med-surg order in since 2147

## 2019-12-29 ENCOUNTER — Encounter: Payer: Self-pay | Admitting: Family Medicine

## 2019-12-29 DIAGNOSIS — Z96651 Presence of right artificial knee joint: Secondary | ICD-10-CM | POA: Diagnosis not present

## 2019-12-29 DIAGNOSIS — Z86718 Personal history of other venous thrombosis and embolism: Secondary | ICD-10-CM | POA: Diagnosis not present

## 2019-12-29 DIAGNOSIS — S8001XA Contusion of right knee, initial encounter: Secondary | ICD-10-CM | POA: Diagnosis not present

## 2019-12-29 DIAGNOSIS — M25061 Hemarthrosis, right knee: Secondary | ICD-10-CM | POA: Diagnosis present

## 2019-12-29 DIAGNOSIS — C7800 Secondary malignant neoplasm of unspecified lung: Secondary | ICD-10-CM | POA: Diagnosis present

## 2019-12-29 DIAGNOSIS — C7801 Secondary malignant neoplasm of right lung: Secondary | ICD-10-CM | POA: Diagnosis not present

## 2019-12-29 DIAGNOSIS — E876 Hypokalemia: Secondary | ICD-10-CM | POA: Diagnosis present

## 2019-12-29 DIAGNOSIS — M79604 Pain in right leg: Secondary | ICD-10-CM | POA: Diagnosis present

## 2019-12-29 DIAGNOSIS — N1330 Unspecified hydronephrosis: Secondary | ICD-10-CM

## 2019-12-29 DIAGNOSIS — Z20822 Contact with and (suspected) exposure to covid-19: Secondary | ICD-10-CM | POA: Diagnosis present

## 2019-12-29 DIAGNOSIS — Z9221 Personal history of antineoplastic chemotherapy: Secondary | ICD-10-CM | POA: Diagnosis not present

## 2019-12-29 DIAGNOSIS — Z90721 Acquired absence of ovaries, unilateral: Secondary | ICD-10-CM | POA: Diagnosis not present

## 2019-12-29 DIAGNOSIS — Z8541 Personal history of malignant neoplasm of cervix uteri: Secondary | ICD-10-CM | POA: Diagnosis not present

## 2019-12-29 DIAGNOSIS — E559 Vitamin D deficiency, unspecified: Secondary | ICD-10-CM | POA: Diagnosis present

## 2019-12-29 DIAGNOSIS — C539 Malignant neoplasm of cervix uteri, unspecified: Secondary | ICD-10-CM | POA: Diagnosis not present

## 2019-12-29 DIAGNOSIS — G893 Neoplasm related pain (acute) (chronic): Secondary | ICD-10-CM | POA: Diagnosis not present

## 2019-12-29 DIAGNOSIS — B1920 Unspecified viral hepatitis C without hepatic coma: Secondary | ICD-10-CM | POA: Diagnosis present

## 2019-12-29 DIAGNOSIS — Z96653 Presence of artificial knee joint, bilateral: Secondary | ICD-10-CM | POA: Diagnosis present

## 2019-12-29 DIAGNOSIS — C7802 Secondary malignant neoplasm of left lung: Secondary | ICD-10-CM

## 2019-12-29 DIAGNOSIS — E2740 Unspecified adrenocortical insufficiency: Secondary | ICD-10-CM | POA: Diagnosis present

## 2019-12-29 DIAGNOSIS — R7401 Elevation of levels of liver transaminase levels: Secondary | ICD-10-CM | POA: Diagnosis not present

## 2019-12-29 DIAGNOSIS — I700 Atherosclerosis of aorta: Secondary | ICD-10-CM | POA: Diagnosis present

## 2019-12-29 DIAGNOSIS — C538 Malignant neoplasm of overlapping sites of cervix uteri: Secondary | ICD-10-CM

## 2019-12-29 DIAGNOSIS — M79605 Pain in left leg: Secondary | ICD-10-CM | POA: Diagnosis not present

## 2019-12-29 DIAGNOSIS — F419 Anxiety disorder, unspecified: Secondary | ICD-10-CM | POA: Diagnosis present

## 2019-12-29 DIAGNOSIS — M7989 Other specified soft tissue disorders: Secondary | ICD-10-CM

## 2019-12-29 DIAGNOSIS — R1030 Lower abdominal pain, unspecified: Secondary | ICD-10-CM | POA: Diagnosis not present

## 2019-12-29 DIAGNOSIS — E871 Hypo-osmolality and hyponatremia: Secondary | ICD-10-CM | POA: Diagnosis present

## 2019-12-29 DIAGNOSIS — N134 Hydroureter: Secondary | ICD-10-CM

## 2019-12-29 DIAGNOSIS — K219 Gastro-esophageal reflux disease without esophagitis: Secondary | ICD-10-CM | POA: Diagnosis present

## 2019-12-29 DIAGNOSIS — Z66 Do not resuscitate: Secondary | ICD-10-CM | POA: Diagnosis present

## 2019-12-29 DIAGNOSIS — D6851 Activated protein C resistance: Secondary | ICD-10-CM | POA: Diagnosis present

## 2019-12-29 DIAGNOSIS — Z515 Encounter for palliative care: Secondary | ICD-10-CM | POA: Diagnosis not present

## 2019-12-29 DIAGNOSIS — M96840 Postprocedural hematoma of a musculoskeletal structure following a musculoskeletal system procedure: Secondary | ICD-10-CM | POA: Diagnosis present

## 2019-12-29 DIAGNOSIS — I10 Essential (primary) hypertension: Secondary | ICD-10-CM | POA: Diagnosis present

## 2019-12-29 DIAGNOSIS — N280 Ischemia and infarction of kidney: Secondary | ICD-10-CM | POA: Diagnosis present

## 2019-12-29 DIAGNOSIS — Y838 Other surgical procedures as the cause of abnormal reaction of the patient, or of later complication, without mention of misadventure at the time of the procedure: Secondary | ICD-10-CM | POA: Diagnosis present

## 2019-12-29 DIAGNOSIS — Z888 Allergy status to other drugs, medicaments and biological substances status: Secondary | ICD-10-CM | POA: Diagnosis not present

## 2019-12-29 LAB — BASIC METABOLIC PANEL
Anion gap: 12 (ref 5–15)
BUN: 7 mg/dL (ref 6–20)
CO2: 24 mmol/L (ref 22–32)
Calcium: 8.4 mg/dL — ABNORMAL LOW (ref 8.9–10.3)
Chloride: 95 mmol/L — ABNORMAL LOW (ref 98–111)
Creatinine, Ser: 0.82 mg/dL (ref 0.44–1.00)
GFR calc Af Amer: >60 mL/min (ref >60)
GFR calc non Af Amer: >60 mL/min (ref >60)
Glucose, Bld: 89 mg/dL (ref 70–99)
Potassium: 3.4 mmol/L — ABNORMAL LOW (ref 3.5–5.1)
Sodium: 131 mmol/L — ABNORMAL LOW (ref 135–145)

## 2019-12-29 LAB — APTT: aPTT: 99 seconds — ABNORMAL HIGH (ref 24–36)

## 2019-12-29 LAB — CBC
HCT: 25.5 % — ABNORMAL LOW (ref 36.0–46.0)
Hemoglobin: 8.4 g/dL — ABNORMAL LOW (ref 12.0–15.0)
MCH: 32.7 pg (ref 26.0–34.0)
MCHC: 32.9 g/dL (ref 30.0–36.0)
MCV: 99.2 fL (ref 80.0–100.0)
Platelets: 245 10*3/uL (ref 150–400)
RBC: 2.57 MIL/uL — ABNORMAL LOW (ref 3.87–5.11)
RDW: 13.2 % (ref 11.5–15.5)
WBC: 4.1 10*3/uL (ref 4.0–10.5)
nRBC: 0 % (ref 0.0–0.2)

## 2019-12-29 LAB — SARS CORONAVIRUS 2 (TAT 6-24 HRS): SARS Coronavirus 2: NEGATIVE

## 2019-12-29 LAB — PROTIME-INR
INR: 1.5 — ABNORMAL HIGH (ref 0.8–1.2)
Prothrombin Time: 17.7 seconds — ABNORMAL HIGH (ref 11.4–15.2)

## 2019-12-29 LAB — FIBRINOGEN: Fibrinogen: 582 mg/dL — ABNORMAL HIGH (ref 210–475)

## 2019-12-29 MED ORDER — CHLORHEXIDINE GLUCONATE CLOTH 2 % EX PADS
6.0000 | MEDICATED_PAD | Freq: Every day | CUTANEOUS | Status: DC
  Administered 2019-12-29 – 2020-01-02 (×5): 6 via TOPICAL

## 2019-12-29 MED ORDER — SODIUM CHLORIDE 0.9% FLUSH
10.0000 mL | Freq: Two times a day (BID) | INTRAVENOUS | Status: DC
  Administered 2019-12-29 – 2020-01-02 (×6): 10 mL

## 2019-12-29 MED ORDER — SODIUM CHLORIDE 0.9% FLUSH
10.0000 mL | INTRAVENOUS | Status: DC | PRN
  Administered 2020-01-02: 10 mL

## 2019-12-29 MED ORDER — HYDROMORPHONE HCL 1 MG/ML IJ SOLN
0.5000 mg | INTRAMUSCULAR | Status: DC | PRN
  Administered 2019-12-29 – 2019-12-30 (×4): 0.5 mg via INTRAVENOUS
  Filled 2019-12-29 (×4): qty 1

## 2019-12-29 MED ORDER — ALPRAZOLAM 0.5 MG PO TABS
0.5000 mg | ORAL_TABLET | Freq: Three times a day (TID) | ORAL | Status: DC | PRN
  Administered 2019-12-29 – 2020-01-01 (×5): 0.5 mg via ORAL
  Filled 2019-12-29 (×5): qty 1

## 2019-12-29 NOTE — Assessment & Plan Note (Addendum)
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  #Postoperative hematoma -status post right TKA-currently off anticoagulation-continue to hold anticoagulation.  Patient status post IVC filter.   #History of DVT PE/factor V Leiden-on indefinite anticoagulation; currently on hold-see above.  S/p IVC filter.  We will plan to start anticoagulation next week per patient basis.  Start with prophylactic dose.   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  CT scan abdomen pelvis-shows progression of disease in the lungs; also progressive disease in the left pelvis-worsening; clinically stable.  We will plan to start chemotherapy once orthopedics.  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  Monitor closely.  #Pain control--patient has chronic baseline pain because of arthritis/chronic abdominal pain-worsened given the hematoma right lower extremity.  Appreciate recommendations from cardiac care/pain medication adjusted.  Patient comfortable.  Okay to be discharged from oncology standpoint discussed with hospitalist; also discussed with [facility].

## 2019-12-29 NOTE — Discharge Instructions (Signed)
Hydronephrosis  Hydronephrosis is the swelling of one or both kidneys due to a blockage that stops urine from flowing out of the body. Kidneys filter waste from the blood and produce urine. This condition can lead to kidney failure and may become life threatening if not treated promptly. What are the causes? Common causes of this condition include:  Problems that occur when a baby is developing in the womb (congenital defect). These can include problems: ? In the kidneys. ? In the tubes that drain urine from the kidneys into the bladder (ureters).  Kidney stones.  Bladder infection.  An enlarged prostate gland.  Scar tissue from a previous surgery or injury.  A blood clot.  A tumor or cyst in the abdomen or pelvis.  Cancer of the prostate, bladder, uterus, ovary, or colon. What are the signs or symptoms? Symptoms of this condition include:  Pain or discomfort in your side (flank).  Pain and swelling in your abdomen.  Nausea and vomiting.  Fever.  Pain when passing urine.  Feelings of urgency when you need to urinate.  Urinating more often than normal. In some cases, you may not have any symptoms. How is this diagnosed? This condition may be diagnosed based on:  Your symptoms and medical history.  A physical exam.  Blood and urine tests.  Imaging tests, such as an ultrasound, CT scan, or MRI.  A procedure in which a scope is inserted into the urethra and used to view parts of the urinary tract and bladder (cystoscopy). How is this treated? Treatment for this condition depends on where the blockage is, how long it has been there, and what caused it. The goal of treatment is to remove the blockage. Treatment may include:  Antibiotic medicines to treat or prevent infection.  A procedure to place a small, thin tube (stent) into a blocked ureter. The stent will keep the ureter open so that urine can drain through it.  A nonsurgical procedure that crushes kidney  stones with shock waves (extracorporeal shock wave lithotripsy).  If kidney failure occurs, treatment may include dialysis or a kidney transplant. Follow these instructions at home:   Take over-the-counter and prescription medicines only as told by your health care provider.  Rest and return to your normal activities as told by your health care provider. Ask your health care provider what activities are safe for you.  Drink enough fluid to keep your urine pale yellow.  If you were prescribed an antibiotic medicine, take it exactly as told by your health care provider. Do not stop taking the antibiotic even if you start to feel better.  Keep all follow-up visits as told by your health care provider. This is important. Contact a health care provider if:  You continue to have symptoms after treatment.  You develop new symptoms.  Your urine becomes cloudy or bloody.  You have a fever. Get help right away if:  You have severe flank or abdominal pain.  You cannot drink fluids without vomiting. Summary  Hydronephrosis is the swelling of one or both kidneys due to a blockage that stops urine from flowing out of the body.  Hydronephrosis can lead to kidney failure and may become life threatening if not treated promptly.  The goal of treatment is to treat the cause of the blockage. It may include insertion of stent into a blocked ureter, a procedure to treat kidney stones, and antibiotic medicines.  Follow your health care provider's instructions for taking care of yourself at  home, including instructions about drinking fluids, taking medicines, and limiting activities. This information is not intended to replace advice given to you by your health care provider. Make sure you discuss any questions you have with your health care provider. Document Revised: 10/13/2017 Document Reviewed: 10/13/2017 Elsevier Patient Education  2020 [employer].

## 2019-12-29 NOTE — Consult Note (Signed)
[facility] NOTE  Patient Care Team: Towanda Malkin, MD as PCP - General (Internal Medicine) Mellody Drown, MD as Consulting Physician (Obstetrics and Gynecology) [name], MD as Consulting Physician (Gastroenterology) Michael Boston, MD as Consulting Physician (General Surgery) Lovell Sheehan, MD as Consulting Physician (Orthopedic Surgery) Cammie Sickle, MD as Consulting Physician (Oncology) Earnestine Leys, MD as Consulting Physician (Orthopedic Surgery)  CHIEF COMPLAINTS/PURPOSE OF CONSULTATION: Cervical cancer  HISTORY OF PRESENTING ILLNESS:  Joanna Hall 54 y.o.  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   Patient underwent TKA recently.  Postoperatively she recovered well-and discharged home on Lovenox 120 mg subcu once a day.  Patient has not started yet on Xarelto.  Patient noted to have worsening swelling and pain/bruising of the right lower extremity for which she was evaluated in the emergency room.  Ultrasound Doppler was negative for any DVT.  A CT scan abdomen pelvis-showed progressive/new metastatic lesions in the lung.  Also showed new moderate size left hydronephrosis-concerning for obstruction in the pelvis from malignancy.  Patient complains of pain in the right lower extremity and swelling.  Denies any bleeding otherwise.  Review of Systems  Constitutional: Positive for fever. Negative for chills, diaphoresis, malaise/fatigue and weight loss.  HENT: Negative for nosebleeds and sore throat.   Eyes: Negative for double vision.  Respiratory: Negative for cough, hemoptysis, sputum production, shortness of breath and wheezing.   Cardiovascular: Positive for leg swelling. Negative  for chest pain, palpitations and orthopnea.  Gastrointestinal: Positive for abdominal pain. Negative for blood in stool, constipation, diarrhea, heartburn, melena, nausea and vomiting.  Genitourinary: Negative for dysuria, frequency and urgency.  Musculoskeletal: Positive for back pain and joint pain.  Skin: Negative.  Negative for itching and rash.  Neurological: Negative for dizziness, tingling, focal weakness, weakness and headaches.  Endo/Heme/Allergies: Bruises/bleeds easily.  Psychiatric/Behavioral: Negative for depression. The patient is not nervous/anxious and does not have insomnia.      MEDICAL HISTORY:  Past Medical History:  Diagnosis Date  . Abdominal pain 06/10/2018  . Abnormal cervical Papanicolaou smear 09/18/2017  . Anxiety   . Aortic atherosclerosis ([place])   . Arthritis    neck and knees  . 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 Blood transfusion without reported diagnosis   . Cervical cancer ([place]) 09/2016   mets lung  . Chronic anal fissure   . Chronic diarrhea   . Dyspnea   . Erosive gastropathy 09/18/2017  . Factor V Leiden mutation ([place])   . Fecal incontinence   . Genital warts   . GERD (gastroesophageal reflux disease)   . GI bleed 10/08/2018  . Heart murmur   . Hematochezia   . Hemorrhoids   . 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 Hepatitis, chronic ([place]) 05/05/2017  . History of cancer chemotherapy    completed 06/2017  . History of Clostridium difficile infection    while undergoing chemo.  Negative test 10/2017  . Ileocolic anastomotic leak   . 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 Intestinal infection due to Clostridium difficile 09/18/2017  . Macrocytic anemia with vitamin B12 deficiency   . Multiple gastric ulcers   . Nausea vomiting and  diarrhea   . Pancolitis ([place]) 07/27/2018  . Perianal condylomata   . Pneumonia    History of  . Pulmonary nodules   . Rectal bleeding   . Small  bowel obstruction ([place]) 08/2017  . Stiff neck    limited right turn  . Vitamin D deficiency     SURGICAL HISTORY: Past Surgical History:  Procedure Laterality Date  . CHOLECYSTECTOMY    . COLON SURGERY  08/2017   resection  . COLONOSCOPY WITH PROPOFOL N/A 12/20/2017   Procedure: COLONOSCOPY WITH PROPOFOL;  Surgeon: [name], MD;  Location: [facility] ENDOSCOPY;  Service: Gastroenterology;  Laterality: N/A;  . COLONOSCOPY WITH PROPOFOL N/A 07/30/2018   Procedure: COLONOSCOPY WITH PROPOFOL;  Surgeon: [name], MD;  Location: [facility] ENDOSCOPY;  Service: Gastroenterology;  Laterality: N/A;  . COLONOSCOPY WITH PROPOFOL N/A 10/10/2018   Procedure: COLONOSCOPY WITH PROPOFOL;  Surgeon: Lucilla Lame, MD;  Location: [facility] ENDOSCOPY;  Service: Endoscopy;  Laterality: N/A;  . DIAGNOSTIC LAPAROSCOPY    . ESOPHAGOGASTRODUODENOSCOPY (EGD) WITH PROPOFOL N/A 12/20/2017   Procedure: ESOPHAGOGASTRODUODENOSCOPY (EGD) WITH PROPOFOL;  Surgeon: [name], MD;  Location: [place];  Service: Gastroenterology;  Laterality: N/A;  . ESOPHAGOGASTRODUODENOSCOPY (EGD) WITH PROPOFOL  07/30/2018   Procedure: ESOPHAGOGASTRODUODENOSCOPY (EGD) WITH PROPOFOL;  Surgeon: [name], MD;  Location: [facility] ENDOSCOPY;  Service: Gastroenterology;;  . Otho Darner SIGMOIDOSCOPY N/A 11/21/2018   Procedure: FLEXIBLE SIGMOIDOSCOPY;  Surgeon: [name], MD;  Location: [facility] ENDOSCOPY;  Service: Gastroenterology;  Laterality: N/A;  . LAPAROTOMY N/A 08/31/2017   Procedure: EXPLORATORY LAPAROTOMY for SBO, ileocolectomy, removal of piece of uterine wall;  Surgeon: [name], MD;  Location: [facility] ORS;  Service: General;  Laterality: N/A;  . LASER ABLATION CONDOLAMATA N/A 02/22/2018   Procedure: LASER ABLATION/REMOVAL OF YQMVHQIONGE AROUND ANUS AND VAGINA;  Surgeon: Michael Boston, MD;  Location: [facility];  Service: General;  Laterality: N/A;  . OOPHORECTOMY    . PORTA CATH INSERTION  N/A 05/13/2018   Procedure: PORTA CATH INSERTION;  Surgeon: Katha Cabal, MD;  Location: [facility] CV LAB;  Service: Cardiovascular;  Laterality: N/A;  . SMALL INTESTINE SURGERY    . TANDEM RING INSERTION     x3  . THORACOTOMY    . TOTAL KNEE ARTHROPLASTY Left 04/24/2018   Procedure: TOTAL KNEE ARTHROPLASTY;  Surgeon: Lovell Sheehan, MD;  Location: [facility] ORS;  Service: Orthopedics;  Laterality: Left;  . TOTAL KNEE ARTHROPLASTY Right 12/24/2019   Procedure: RIGHT TOTAL KNEE ARTHROPLASTY;  Surgeon: Lovell Sheehan, MD;  Location: [facility] ORS;  Service: Orthopedics;  Laterality: Right;    SOCIAL HISTORY: Social History   Socioeconomic History  . Marital status: Divorced    Spouse name: Not on file  . Number of children: Not on file  . Years of education: Not on file  . Highest education level: Not on file  Occupational History  . Not on file  Tobacco Use  . Smoking status: Former Smoker    Packs/day: 0.25    Years: 10.00    Pack years: 2.50    Types: Cigarettes    Quit date: 10/16/2006    Years since quitting: 13.2  . Smokeless tobacco: Never Used  Substance and Sexual Activity  . Alcohol use: Not Currently    Comment: seldom  . Drug use: Yes    Types: Marijuana    Comment: not very often  . Sexual activity: Not Currently    Birth control/protection: Post-menopausal    Comment: Not  Asked  Other Topics Concern  . Not on file  Social History Narrative  . Not on file   Social Determinants of Health   Financial Resource Strain:   . Difficulty of Paying Living Expenses:   Food Insecurity:   . Worried About Charity fundraiser in the Last Year:   . Arboriculturist in the Last Year:   Transportation Needs:   . Film/video editor (Medical):   Marland Kitchen Lack of Transportation (Non-Medical):   Physical Activity:   . Days of Exercise per Week:   . Minutes of Exercise per Session:   Stress:   . Feeling of Stress :   Social Connections:   . Frequency of Communication with  Friends and Family:   . Frequency of Social Gatherings with Friends and Family:   . Attends Religious Services:   . Active Member of Clubs or Organizations:   . Attends Archivist Meetings:   Marland Kitchen Marital Status:   Intimate Partner Violence:   . Fear of Current or Ex-Partner:   . Emotionally Abused:   Marland Kitchen Physically Abused:   . Sexually Abused:     FAMILY HISTORY: Family History  Problem Relation Age of Onset  . Hypertension Mother   . Hypertension Father   . Diabetes Father   . Hyperlipidemia Father   . Alcohol abuse Daughter   . Hypertension Maternal Grandmother   . Diabetes Maternal Grandmother   . Diabetes Paternal Grandmother   . Hypertension Paternal Grandmother     ALLERGIES:  is allergic to ketamine.  MEDICATIONS:  Current Facility-Administered Medications  Medication Dose Route Frequency Provider Last Rate Last Admin  . 0.9 %  sodium chloride infusion   Intravenous Continuous Mansy, Jan A, MD 75 mL/hr at 12/29/19 1137 New Bag at 12/29/19 1137  . acetaminophen (TYLENOL) tablet 650 mg  650 mg Oral Q6H PRN Mansy, Jan A, MD       Or  . acetaminophen (TYLENOL) suppository 650 mg  650 mg Rectal Q6H PRN Mansy, Jan A, MD      . albuterol (PROVENTIL) (2.5 MG/3ML) 0.083% nebulizer solution 2.5 mg  2.5 mg Inhalation Q4H PRN Mansy, Jan A, MD      . ALPRAZolam Duanne Moron) tablet 0.5 mg  0.5 mg Oral TID PRN Wyvonnia Dusky, MD   0.5 mg at 12/29/19 0950  . amitriptyline (ELAVIL) tablet 100 mg  100 mg Oral QHS Mansy, Jan A, MD   100 mg at 12/29/19 0015  . calcium-vitamin D (OSCAL WITH D) 500-200 MG-UNIT per tablet 1 tablet  1 tablet Oral Daily Mansy, Jan A, MD   1 tablet at 12/29/19 0759  . Chlorhexidine Gluconate Cloth 2 % PADS 6 each  6 each Topical Daily Wyvonnia Dusky, MD   6 each at 12/29/19 1216  . colchicine tablet 0.6 mg  0.6 mg Oral Daily Mansy, Jan A, MD   0.6 mg at 12/29/19 0758  . docusate sodium (COLACE) capsule 100 mg  100 mg Oral BID Mansy, Jan A, MD   100  mg at 12/29/19 0758  . famotidine (PEPCID) tablet 20 mg  20 mg Oral BID Mansy, Jan A, MD   20 mg at 12/29/19 0759  . folic acid (FOLVITE) tablet 1 mg  1 mg Oral Daily Mansy, Jan A, MD   1 mg at 12/29/19 0758  . hydrocortisone (CORTEF) tablet 10 mg  10 mg Oral BID Mansy, Jan A, MD   10 mg at 12/29/19 0759  .  hydrOXYzine (ATARAX/VISTARIL) tablet 25 mg  25 mg Oral TID PRN Mansy, Jan A, MD      . hyoscyamine (LEVBID) 0.375 MG 12 hr tablet 0.375 mg  0.375 mg Oral Q12H Mansy, Jan A, MD   0.375 mg at 12/29/19 0800  . magnesium hydroxide (MILK OF MAGNESIA) suspension 30 mL  30 mL Oral Daily PRN Mansy, Jan A, MD      . methocarbamol (ROBAXIN) tablet 500 mg  500 mg Oral Q6H PRN Mansy, Jan A, MD   500 mg at 12/29/19 0826  . morphine 2 MG/ML injection 2 mg  2 mg Intravenous Q4H PRN Mansy, Jan A, MD   2 mg at 12/29/19 1653  . multivitamin with minerals tablet 1 tablet  1 tablet Oral Daily Mansy, Jan A, MD   1 tablet at 12/29/19 0757  . ondansetron (ZOFRAN) tablet 4 mg  4 mg Oral Q6H PRN Mansy, Jan A, MD       Or  . ondansetron [facility]) injection 4 mg  4 mg Intravenous Q6H PRN Mansy, Jan A, MD   4 mg at 12/29/19 0022  . oxyCODONE (Oxy IR/ROXICODONE) immediate release tablet 10 mg  10 mg Oral Q4H PRN Mansy, Jan A, MD      . oxyCODONE (OXYCONTIN) 12 hr tablet 20 mg  20 mg Oral Q8H Mansy, Jan A, MD   20 mg at 12/29/19 1339  . pantoprazole (PROTONIX) EC tablet 40 mg  40 mg Oral Daily Mansy, Jan A, MD   40 mg at 12/29/19 0757  . potassium chloride SA (KLOR-CON) CR tablet 20 mEq  20 mEq Oral BID Mansy, Jan A, MD   20 mEq at 12/29/19 0758  . sertraline (ZOLOFT) tablet 25 mg  25 mg Oral Daily Mansy, Jan A, MD   25 mg at 12/29/19 0757  . sodium chloride flush (NS) 0.9 % injection 10-40 mL  10-40 mL Intracatheter Q12H Wyvonnia Dusky, MD   10 mL at 12/29/19 1321  . sodium chloride flush (NS) 0.9 % injection 10-40 mL  10-40 mL Intracatheter PRN Wyvonnia Dusky, MD      . traZODone (DESYREL) tablet 25 mg  25 mg  Oral QHS PRN Mansy, Jan A, MD   25 mg at 12/29/19 0128   Facility-Administered Medications Ordered in Other Encounters  Medication Dose Route Frequency Provider Last Rate Last Admin  . heparin lock flush 100 unit/mL  500 Units Intravenous Once Corcoran, Melissa C, MD      . sodium chloride 0.9 % 1,000 mL with potassium chloride 20 mEq infusion   Intravenous Once Honor Loh E, NP      . sodium chloride flush (NS) 0.9 % injection 10 mL  10 mL Intravenous Once Borders, Kirt Boys, NP          .  PHYSICAL EXAMINATION:  Vitals:   12/29/19 0117 12/29/19 0829  BP: (!) 152/95 (!) 151/90  Pulse: 96 (!) 109  Resp: 18 20  Temp: 97.6 F (36.4 C) 98.5 F (36.9 C)  SpO2: 100% 99%   Filed Weights   12/28/19 1737  Weight: 171 lb 15.3 oz (78 kg)    Physical Exam  Constitutional: She is oriented to person, place, and time and well-developed, well-nourished, and in no distress.  HENT:  Head: Normocephalic and atraumatic.  Mouth/Throat: Oropharynx is clear and moist. No oropharyngeal exudate.  Eyes: Pupils are equal, round, and reactive to light.  Cardiovascular: Normal rate and regular rhythm.  Pulmonary/Chest: No respiratory distress.  She has no wheezes.  Decreased air entry bilaterally.  Abdominal: Soft. Bowel sounds are normal. She exhibits no distension and no mass. There is no abdominal tenderness. There is no rebound and no guarding.  Musculoskeletal:        General: No tenderness or edema. Normal range of motion.     Cervical back: Normal range of motion and neck supple.     Comments: Swelling ecchymosis noted of the right lower extremity.  Right TKA incision noted  Neurological: She is alert and oriented to person, place, and time.  Skin: Skin is warm.  Psychiatric: Affect normal.     LABORATORY DATA:  I have reviewed the data as listed Lab Results  Component Value Date   WBC 4.1 12/29/2019   HGB 8.4 (L) 12/29/2019   HCT 25.5 (L) 12/29/2019   MCV 99.2 12/29/2019   PLT 245  12/29/2019   Recent Labs    09/25/19 1338 09/26/19 0556 12/11/19 0855 12/22/19 1032 12/27/19 1032 12/28/19 1724 12/29/19 0702  NA  --    < > 137   < > 133* 132* 131*  K  --    < > 4.3   < > 3.7 3.6 3.4*  CL  --    < > 103   < > 96* 96* 95*  CO2  --    < > 27   < > 26 26 24   GLUCOSE  --    < > 88   < > 78 71 89  BUN  --    < > 12   < > 9 10 7   CREATININE  --    < > 0.86   < > 0.80 0.87 0.82  CALCIUM  --    < > 8.9   < > 8.5* 8.5* 8.4*  GFRNONAA  --    < > >60   < > >60 >60 >60  GFRAA  --    < > >60   < > >60 >60 >60  PROT 7.4   < > 7.5  --  6.9 6.7  --   ALBUMIN 3.5   < > 3.7  --  3.1* 3.1*  --   AST 83*   < > 39  --  80* 79*  --   ALT 134*   < > 40  --  61* 58*  --   ALKPHOS 180*   < > 171*  --  269* 241*  --   BILITOT 0.6   < > 0.5  --  1.5* 1.4*  --   BILIDIR 0.2  --   --   --   --   --   --   IBILI 0.4  --   --   --   --   --   --    < > = values in this interval not displayed.    RADIOGRAPHIC STUDIES: I have personally reviewed the radiological images as listed and agreed with the findings in the report. DG Chest 2 View  Result Date: 12/11/2019 CLINICAL DATA:  Cough.  Cervical carcinoma EXAM: CHEST - 2 VIEW COMPARISON:  October 18, 2019 chest radiograph and chest CT October 06, 2019 FINDINGS: There is atelectatic change in the left base. There are scattered subcentimeter nodular lesions consistent with metastatic disease. Heart size and pulmonary vascular normal. No adenopathy. Port-A-Cath tip is in the superior vena cava. No pneumothorax. No bone lesions IMPRESSION: Atelectatic change lateral left base with questionable earliest changes of pneumonia.  Multiple subcentimeter nodular lesions consistent with metastatic foci. No adenopathy. Heart size normal. Port-A-Cath tip in superior vena cava. Electronically Signed   By: Lowella Grip III M.D.   On: 12/11/2019 10:41   DG Knee 1-2 Views Right  Result Date: 12/24/2019 CLINICAL DATA:  Right total knee replacement EXAM:  RIGHT KNEE - 1-2 VIEW COMPARISON:  None. FINDINGS: Changes of right total knee replacement. No hardware or bony complicating feature. Soft tissue and joint space gas noted. IMPRESSION: Right knee replacement.  No visible complicating feature. Electronically Signed   By: Rolm Baptise M.D.   On: 12/24/2019 10:09   DG Tibia/Fibula Right  Result Date: 12/28/2019 CLINICAL DATA:  Postop pain, edema, and ecchymosis. Surgery on Wednesday, 4 days ago. EXAM: RIGHT TIBIA AND FIBULA - 2 VIEW COMPARISON:  None. FINDINGS: Right knee arthroplasty in expected alignment. No periprosthetic lucency. Remainder of the tibia is intact. Fibula is normal. No periosteal reaction or bony destruction. Generalized soft tissue edema. Anterior skin staples about the knee. Calcifications in the soft tissues anteromedially are likely phleboliths. IMPRESSION: Generalized soft tissue edema of the lower leg. No acute osseous abnormality. Electronically Signed   By: Keith Rake M.D.   On: 12/28/2019 19:48   CT ABDOMEN PELVIS W CONTRAST  Result Date: 12/29/2019 CLINICAL DATA:  Recent right knee replacement, concern for extending DVT EXAM: CT ABDOMEN AND PELVIS WITH CONTRAST TECHNIQUE: Multidetector CT imaging of the abdomen and pelvis was performed using the standard protocol following bolus administration of intravenous contrast. CONTRAST:  136m OMNIPAQUE IOHEXOL 300 MG/ML  SOLN COMPARISON:  CT abdomen pelvis 09/22/2019 FINDINGS: Lower chest: There is an increasing size and number of the multiple centrally cavitating nodules in both lower lobes largest in the right lower lobe measuring up to 9.4 mm, previously 4 mm (2/30). No consolidation or effusion. Normal heart size. No pericardial effusion. Hepatobiliary: No focal liver abnormality is seen. Patient is post cholecystectomy. Slight prominence of the biliary tree likely related to reservoir effect. No calcified intraductal gallstones. Pancreas: Unremarkable. No pancreatic ductal  dilatation or surrounding inflammatory changes. Spleen: Normal in size without focal abnormality. Adrenals/Urinary Tract: Normal adrenal glands. Moderate left hydroureteronephrosis to the level of soft tissue thickening in the distal left ureter (2/76) which is unclear if this arises de novo or is related to the adjacent soft tissue thickening and the level of the cervix in this patient with known cervical cancer. There is an associated delayed left nephrogram and delayed excretion on excretory phase imaging. No right urinary tract dilatation. No suspicious renal lesions or urolithiasis. Urinary bladder is otherwise unremarkable. Stomach/Bowel: Distal esophagus, stomach and duodenum are unremarkable. No small bowel dilatation or wall thickening. There are postsurgical changes from prior right hemicolectomy with patent anastomosis. Much of the proximal colon is fluid-filled with minimal formed stool distally. There is distal circumferential mural thickening of the sigmoid and rectum. Vascular/Lymphatic: Atherosclerotic plaque within the normal caliber aorta. No venous filling defects are visualized in the iliac veins or IVC. No pathologic enlarged nodes seen in the abdomen or pelvis. Reproductive: Anteverted uterus. There is some irregular thickening at the level of the cervix which extends left anterolaterally with loss of discernible fat plane with the adjacent dilated left ureter (2/75). No concerning adnexal lesions. Other: There is small amount of presacral stranding and thickening, similar to the comparison studies. Postsurgical changes of the anterior abdominal wall are noted. Mild body wall edema. Musculoskeletal: Levocurvature of the lumbar spine with an apex at L4 is similar  to prior. Diffuse sclerotic changes are noted at the SI joints. Appearance is similar to prior. Chronic insufficiency fractures are noted bilaterally. There is asymmetric sclerosis of the left pubic body stable to comparison.  IMPRESSION: 1. Moderate left hydroureteronephrosis to the level of soft tissue thickening in the distal left ureter which is possibly the result of local recurrence at the level of the cervix given loss of clearly discernible fat plane from the adjacent cervical soft tissues versus a de novo process at the ureterovesicular junction. 2. Interval increase in size and number of the multiple centrally cavitating nodules in both lower lobes largest in the right lower lobe measuring up to 9.4 mm, previously 4 mm. 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 3. Circumferential mural thickening of the sigmoid and rectum may represent infectious or inflammatory colitis. 4. Stable appearance of the presacral stranding and thickening, correlate for prior treatment related changes. 5. Sclerotic appearance of the left pubic body, questionable lucency may reflect a pathologic fracture of the appear similar to prior. 6. Bilateral sacral insufficiency fractures and sclerosis of the sacral ala. 7. Aortic Atherosclerosis (ICD10-I70.0). These results were called by telephone at the time of interpretation on 12/29/2019 at 12:03 am to provider Dr Joan Mayans, who verbally acknowledged these results. Electronically Signed   By: Lovena Le M.D.   On: 12/29/2019 00:03   US Venous Img Lower Unilateral Right  Result Date: 12/28/2019 CLINICAL DATA:  54 year old female with recent right knee replacement presenting with right lower extremity edema and ecchymosis. EXAM: Right LOWER EXTREMITY VENOUS DOPPLER ULTRASOUND TECHNIQUE: Gray-Hall sonography with compression, as well as color and duplex ultrasound, were performed to evaluate the deep venous system(s) from the level of the common femoral vein through the popliteal and proximal calf veins. COMPARISON:  Ultrasound dated 12/27/2019. FINDINGS: VENOUS Normal compressibility of the common femoral, superficial femoral, and popliteal veins, as well as the visualized calf veins.  Visualized portions of profunda femoral vein and great saphenous vein unremarkable. No filling defects to suggest DVT on grayscale or color Doppler imaging. Doppler waveforms show normal direction of venous flow, normal respiratory phasicity and response to augmentation. Limited views of the contralateral common femoral vein are unremarkable. OTHER None. Limitations: none IMPRESSION: No femoropopliteal DVT nor evidence of DVT within the visualized calf veins. If clinical symptoms are inconsistent or if there are persistent or worsening symptoms, further imaging (possibly involving the iliac veins) may be warranted. Electronically Signed   By: Anner Crete M.D.   On: 12/28/2019 22:44   US Venous Img Lower Unilateral Right  Result Date: 12/27/2019 CLINICAL DATA:  54 year old with right leg swelling for 2 days. Recent knee replacement. EXAM: RIGHT LOWER EXTREMITY VENOUS DOPPLER ULTRASOUND TECHNIQUE: Gray-Hall sonography with graded compression, as well as color Doppler and duplex ultrasound were performed to evaluate the lower extremity deep venous systems from the level of the common femoral vein and including the common femoral, femoral, profunda femoral, popliteal and calf veins including the posterior tibial, peroneal and gastrocnemius veins when visible. The superficial great saphenous vein was also interrogated. Spectral Doppler was utilized to evaluate flow at rest and with distal augmentation maneuvers in the common femoral, femoral and popliteal veins. COMPARISON:  None. FINDINGS: Contralateral Common Femoral Vein: Respiratory phasicity is normal and symmetric with the symptomatic side. No evidence of thrombus. Normal compressibility. Common Femoral Vein: No evidence of thrombus. Normal compressibility, respiratory phasicity and response to augmentation. Saphenofemoral Junction: No evidence of thrombus. Normal compressibility and flow on color  Doppler imaging. Profunda Femoral Vein: No evidence of  thrombus. Normal compressibility and flow on color Doppler imaging. Femoral Vein: No evidence of thrombus. Normal compressibility, respiratory phasicity and response to augmentation. Popliteal Vein: No evidence of thrombus. Normal compressibility, respiratory phasicity and response to augmentation. Calf Veins: Due to leg edema, the calf veins cannot be confidently identified or evaluated on this examination. Limited Doppler flow in some of the tibial veins. Other Findings:  None. IMPRESSION: No evidence for deep venous thrombosis in the right leg above the knee. Unfortunately, the deep calf veins cannot be evaluated and cannot exclude DVT in the deep calf veins. Electronically Signed   By: Markus Daft M.D.   On: 12/27/2019 11:07   DG Knee Complete 4 Views Right  Result Date: 12/28/2019 CLINICAL DATA:  Postop pain, edema, and ecchymosis. Surgery on Wednesday, 4 days ago. EXAM: RIGHT KNEE - COMPLETE 4+ VIEW COMPARISON:  Knee radiograph 12/24/2019 FINDINGS: Right knee arthroplasty in unchanged alignment. No periprosthetic lucency or fracture. There is been patellar resurfacing. Large joint effusion, increased joint effusion since immediate postoperative exam. Previous intra-articular air has resolved. There are anterior skin staples in place with generalized soft tissue edema. IMPRESSION: Recent right knee arthroplasty in unchanged alignment. Large joint effusion, increased joint effusion since immediate postoperative exam. Generalized soft tissue edema, nonspecific. Electronically Signed   By: Keith Rake M.D.   On: 12/28/2019 19:47   [place] OR NERVE BLOCK-IMAGE ONLY [facility])  Result Date: 12/24/2019 There is no interpretation for this exam.  This order is for images obtained during a surgical procedure.  Please See "Surgeries" Tab for more information regarding the procedure.    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  # Right lower extremity swelling/pain-status post right TKA-negative for DVT; concerning for hematoma [patient discharged home on Lovenox 120 mg subcu once a day].  Pulses felt.  Hold off Xarelto/Lovenox.  Await Ortho evaluation.  #History of DVT PE/factor V Leiden-on indefinite anticoagulation.  Currently off anticoagulation/see above.  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  CT scan abdomen pelvis-shows progression of disease in the lungs; also progressive disease in the left pelvis [see below]  #Left moderate hydronephrosis-likely secondary to recurrent disease in the pelvis.  For now renal function is stable.  Awaiting urology evaluation.   #History of adrenal insufficiency-stable on steroids  Thank you Dr.Williams or allowing me to participate in the care of your pleasant patient. Please do not hesitate to contact me with questions or concerns in the interim.  # I reviewed the blood work- with the patient in detail; also reviewed the imaging independently [as summarized above]; and with the patient in detail.    All questions were answered. The patient knows to call the clinic with any problems, questions or concerns.    Cammie Sickle, MD 12/29/2019 4:56 PM

## 2019-12-29 NOTE — Progress Notes (Signed)
PROGRESS NOTE    Joanna Hall  TZG:[id number] DOB: 1966/04/14 DOA: 12/28/2019 PCP: Towanda Malkin, MD      Assessment & Plan:   Active Problems:   Leg pain   Intractable right knee and leg pain: etiology unclear, ? Hematoma. S/p right total knee arthroplasty with worsening postoperative right knee effusion. Morphine prn for pain. [place] of RLE was neg for DVT.  Intractable nausea and vomiting: etiology unclear, possibly related to narcotic intake. 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 Hold Xarelto as per onco.    Stage IV cervical cancer: with lung mets. Continue on home pain management & home steroid regimen.   Hydroureteronephrosis: moderate as per CT scan. Likely secondary to stage IV cervical cancer. Uro following and recs apprec   Transaminitis: etiology unclear, trending down. Will continue to monitor   Hypokalemia: KCl repleated. Will continue to monitor   Vitamin B12 deficiency: gets vitamin B12 every 3 weeks.  Anxiety: severity unknown. Xanax prn   DVT prophylaxis: xarelto  Code Status: DNR Family Communication:  Disposition Plan: will likely back home when stable but will have PT/OT see the pt when appropriate as well    Consultants:   Oncology  Urology  Ortho surg   Procedures:   Antimicrobials:   Subjective: Pt c/o severe right knee pain   Objective: Vitals:   12/28/19 2241 12/28/19 2300 12/29/19 0000 12/29/19 0117  BP: (!) 130/97 (!) 121/112 133/77 (!) 152/95  Pulse: 99 100 95 96  Resp: 18  18 18   Temp:    97.6 F (36.4 C)  TempSrc:    Oral  SpO2: 100% 98% 100% 100%  Weight:      Height:       No intake or output data in the 24 hours ending 12/29/19 0745 Filed Weights   12/28/19 1737  Weight: 78 kg    Examination:  General exam: Appears anxious & uncomfortable. Appears older than stated age  Respiratory system: diminished  breath sounds b/l.  Cardiovascular system: S1 & S2 +. No  rubs, gallops or clicks. Gastrointestinal system: Abdomen is nondistended, soft and nontender.  Normal bowel sounds heard. Central nervous system: Alert and oriented. Moves all 4 extremities Psychiatry: Judgement and insight appear normal. Anxious mood and affect Extremities: RLE is edematous, tender to palpation & bruising noted     Data Reviewed: I have personally reviewed following labs and imaging studies  CBC: Recent Labs  Lab 12/22/19 1032 12/25/19 0405 12/27/19 1032 12/28/19 1724  WBC 5.6 5.6 5.1 5.6  NEUTROABS  --   --  4.0 4.2  HGB 13.6 8.6* 9.7* 9.2*  HCT 39.4 25.6* 29.0* 28.2*  MCV 97.0 99.2 98.6 100.4*  PLT 250 163 264 759   Basic Metabolic Panel: Recent Labs  Lab 12/22/19 1032 12/25/19 0405 12/27/19 1032 12/28/19 1724  NA 134* 135 133* 132*  K 3.4* 4.1 3.7 3.6  CL 100 104 96* 96*  CO2 23 25 26 26   GLUCOSE 101* 145* 78 71  BUN 11 12 9 10   CREATININE 1.32* 1.16* 0.80 0.87  CALCIUM 9.5 8.3* 8.5* 8.5*  MG 1.5*  --   --   --    GFR: Estimated Creatinine Clearance: 82.3 mL/min (by C-G formula based on SCr of 0.87 mg/dL). Liver Function Tests: Recent Labs  Lab 12/27/19 1032 12/28/19 1724  AST 80* 79*  ALT 61* 58*  ALKPHOS 269* 241*  BILITOT  1.5* 1.4*  PROT 6.9 6.7  ALBUMIN 3.1* 3.1*   No results for input(s): LIPASE, AMYLASE in the last 168 hours. No results for input(s): AMMONIA in the last 168 hours. Coagulation Profile: Recent Labs  Lab 12/22/19 1032 12/27/19 1032 12/28/19 2111  INR 1.0 1.0 2.7*   Cardiac Enzymes: Recent Labs  Lab 12/28/19 2111  CKTOTAL 68   BNP (last 3 results) No results for input(s): PROBNP in the last 8760 hours. HbA1C: No results for input(s): HGBA1C in the last 72 hours. CBG: No results for input(s): GLUCAP in the last 168 hours. Lipid Profile: No results for input(s): CHOL, HDL, LDLCALC, TRIG, CHOLHDL, LDLDIRECT in the last 72 hours. Thyroid  Function Tests: No results for input(s): TSH, T4TOTAL, FREET4, T3FREE, THYROIDAB in the last 72 hours. Anemia Panel: No results for input(s): VITAMINB12, FOLATE, FERRITIN, TIBC, IRON, RETICCTPCT in the last 72 hours. Sepsis Labs: Recent Labs  Lab 12/28/19 2111  LATICACIDVEN 0.8    Recent Results (from the past 240 hour(s))  Surgical PCR screen     Status: None   Collection Time: 12/24/19  6:26 AM   Specimen: Nasal Mucosa; Nasal Swab  Result Value Ref Range Status   MRSA, PCR NEGATIVE NEGATIVE Final   Staphylococcus aureus NEGATIVE NEGATIVE Final    Comment: (NOTE) The Xpert SA Assay (FDA approved for NASAL specimens in patients 53 years of age and older), is one component of a comprehensive surveillance program. It is not intended to diagnose infection nor to guide or monitor treatment. Performed at [facility], [place], [place] [address]   SARS CORONAVIRUS 2 (TAT 6-24 HRS) Nasopharyngeal Nasopharyngeal Swab     Status: None   Collection Time: 12/28/19 10:39 PM   Specimen: Nasopharyngeal Swab  Result Value Ref Range Status   SARS Coronavirus 2 NEGATIVE NEGATIVE Final    Comment: (NOTE) SARS-CoV-2 target nucleic acids are NOT DETECTED. The SARS-CoV-2 RNA is generally detectable in upper and lower respiratory specimens during the acute phase of infection. Negative results do not preclude SARS-CoV-2 infection, do not rule out co-infections with other pathogens, and should not be used as the sole basis for treatment or other patient management decisions. Negative results must be combined with clinical observations, patient history, and epidemiological information. The expected result is Negative. Fact Sheet for Patients: SugarRoll.be Fact Sheet for Healthcare Providers: https://www.woods-mathews.com/ This test is not yet approved or cleared by the [place] FDA and  has been authorized for detection  and/or diagnosis of SARS-CoV-2 by FDA under an Emergency Use Authorization (EUA). This EUA will remain  in effect (meaning this test can be used) for the duration of the COVID-19 declaration under Section 56 4(b)(1) of the Act, 21 U.S.C. section 360bbb-3(b)(1), unless the authorization is terminated or revoked sooner. Performed at [facility], [place] [address], [place], [place] [address]          Radiology Studies: DG Tibia/Fibula Right  Result Date: 12/28/2019 CLINICAL DATA:  Postop pain, edema, and ecchymosis. Surgery on Wednesday, 4 days ago. EXAM: RIGHT TIBIA AND FIBULA - 2 VIEW COMPARISON:  None. FINDINGS: Right knee arthroplasty in expected alignment. No periprosthetic lucency. Remainder of the tibia is intact. Fibula is normal. No periosteal reaction or bony destruction. Generalized soft tissue edema. Anterior skin staples about the knee. Calcifications in the soft tissues anteromedially are likely phleboliths. IMPRESSION: Generalized soft tissue edema of the lower leg. No acute osseous abnormality. Electronically Signed   By: Keith Rake  M.D.   On: 12/28/2019 19:48   CT ABDOMEN PELVIS W CONTRAST  Result Date: 12/29/2019 CLINICAL DATA:  Recent right knee replacement, concern for extending DVT EXAM: CT ABDOMEN AND PELVIS WITH CONTRAST TECHNIQUE: Multidetector CT imaging of the abdomen and pelvis was performed using the standard protocol following bolus administration of intravenous contrast. CONTRAST:  152m OMNIPAQUE IOHEXOL 300 MG/ML  SOLN COMPARISON:  CT abdomen pelvis 09/22/2019 FINDINGS: Lower chest: There is an increasing size and number of the multiple centrally cavitating nodules in both lower lobes largest in the right lower lobe measuring up to 9.4 mm, previously 4 mm (2/30). No consolidation or effusion. Normal heart size. No pericardial effusion. Hepatobiliary: No focal liver abnormality is seen. Patient is post cholecystectomy. Slight prominence of the biliary tree  likely related to reservoir effect. No calcified intraductal gallstones. Pancreas: Unremarkable. No pancreatic ductal dilatation or surrounding inflammatory changes. Spleen: Normal in size without focal abnormality. Adrenals/Urinary Tract: Normal adrenal glands. Moderate left hydroureteronephrosis to the level of soft tissue thickening in the distal left ureter (2/76) which is unclear if this arises de novo or is related to the adjacent soft tissue thickening and the level of the cervix in this patient with known cervical cancer. There is an associated delayed left nephrogram and delayed excretion on excretory phase imaging. No right urinary tract dilatation. No suspicious renal lesions or urolithiasis. Urinary bladder is otherwise unremarkable. Stomach/Bowel: Distal esophagus, stomach and duodenum are unremarkable. No small bowel dilatation or wall thickening. There are postsurgical changes from prior right hemicolectomy with patent anastomosis. Much of the proximal colon is fluid-filled with minimal formed stool distally. There is distal circumferential mural thickening of the sigmoid and rectum. Vascular/Lymphatic: Atherosclerotic plaque within the normal caliber aorta. No venous filling defects are visualized in the iliac veins or IVC. No pathologic enlarged nodes seen in the abdomen or pelvis. Reproductive: Anteverted uterus. There is some irregular thickening at the level of the cervix which extends left anterolaterally with loss of discernible fat plane with the adjacent dilated left ureter (2/75). No concerning adnexal lesions. Other: There is small amount of presacral stranding and thickening, similar to the comparison studies. Postsurgical changes of the anterior abdominal wall are noted. Mild body wall edema. Musculoskeletal: Levocurvature of the lumbar spine with an apex at L4 is similar to prior. Diffuse sclerotic changes are noted at the SI joints. Appearance is similar to prior. Chronic insufficiency  fractures are noted bilaterally. There is asymmetric sclerosis of the left pubic body stable to comparison. IMPRESSION: 1. Moderate left hydroureteronephrosis to the level of soft tissue thickening in the distal left ureter which is possibly the result of local recurrence at the level of the cervix given loss of clearly discernible fat plane from the adjacent cervical soft tissues versus a de novo process at the ureterovesicular junction. 2. Interval increase in size and number of the multiple centrally cavitating nodules in both lower lobes largest in the right lower lobe measuring up to 9.4 mm, previously 4 mm. 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 3. Circumferential mural thickening of the sigmoid and rectum may represent infectious or inflammatory colitis. 4. Stable appearance of the presacral stranding and thickening, correlate for prior treatment related changes. 5. Sclerotic appearance of the left pubic body, questionable lucency may reflect a pathologic fracture of the appear similar to prior. 6. Bilateral sacral insufficiency fractures and sclerosis of the sacral ala. 7. Aortic Atherosclerosis (ICD10-I70.0). These results were called by telephone at the time of interpretation  on 12/29/2019 at 12:03 am to provider Dr Joan Mayans, who verbally acknowledged these results. Electronically Signed   By: Lovena Le M.D.   On: 12/29/2019 00:03   US Venous Img Lower Unilateral Right  Result Date: 12/28/2019 CLINICAL DATA:  54 year old female with recent right knee replacement presenting with right lower extremity edema and ecchymosis. EXAM: Right LOWER EXTREMITY VENOUS DOPPLER ULTRASOUND TECHNIQUE: Gray-scale sonography with compression, as well as color and duplex ultrasound, were performed to evaluate the deep venous system(s) from the level of the common femoral vein through the popliteal and proximal calf veins. COMPARISON:  Ultrasound dated 12/27/2019. FINDINGS: VENOUS Normal  compressibility of the common femoral, superficial femoral, and popliteal veins, as well as the visualized calf veins. Visualized portions of profunda femoral vein and great saphenous vein unremarkable. No filling defects to suggest DVT on grayscale or color Doppler imaging. Doppler waveforms show normal direction of venous flow, normal respiratory phasicity and response to augmentation. Limited views of the contralateral common femoral vein are unremarkable. OTHER None. Limitations: none IMPRESSION: No femoropopliteal DVT nor evidence of DVT within the visualized calf veins. If clinical symptoms are inconsistent or if there are persistent or worsening symptoms, further imaging (possibly involving the iliac veins) may be warranted. Electronically Signed   By: Anner Crete M.D.   On: 12/28/2019 22:44   US Venous Img Lower Unilateral Right  Result Date: 12/27/2019 CLINICAL DATA:  54 year old with right leg swelling for 2 days. Recent knee replacement. EXAM: RIGHT LOWER EXTREMITY VENOUS DOPPLER ULTRASOUND TECHNIQUE: Gray-scale sonography with graded compression, as well as color Doppler and duplex ultrasound were performed to evaluate the lower extremity deep venous systems from the level of the common femoral vein and including the common femoral, femoral, profunda femoral, popliteal and calf veins including the posterior tibial, peroneal and gastrocnemius veins when visible. The superficial great saphenous vein was also interrogated. Spectral Doppler was utilized to evaluate flow at rest and with distal augmentation maneuvers in the common femoral, femoral and popliteal veins. COMPARISON:  None. FINDINGS: Contralateral Common Femoral Vein: Respiratory phasicity is normal and symmetric with the symptomatic side. No evidence of thrombus. Normal compressibility. Common Femoral Vein: No evidence of thrombus. Normal compressibility, respiratory phasicity and response to augmentation. Saphenofemoral Junction: No  evidence of thrombus. Normal compressibility and flow on color Doppler imaging. Profunda Femoral Vein: No evidence of thrombus. Normal compressibility and flow on color Doppler imaging. Femoral Vein: No evidence of thrombus. Normal compressibility, respiratory phasicity and response to augmentation. Popliteal Vein: No evidence of thrombus. Normal compressibility, respiratory phasicity and response to augmentation. Calf Veins: Due to leg edema, the calf veins cannot be confidently identified or evaluated on this examination. Limited Doppler flow in some of the tibial veins. Other Findings:  None. IMPRESSION: No evidence for deep venous thrombosis in the right leg above the knee. Unfortunately, the deep calf veins cannot be evaluated and cannot exclude DVT in the deep calf veins. Electronically Signed   By: Markus Daft M.D.   On: 12/27/2019 11:07   DG Knee Complete 4 Views Right  Result Date: 12/28/2019 CLINICAL DATA:  Postop pain, edema, and ecchymosis. Surgery on Wednesday, 4 days ago. EXAM: RIGHT KNEE - COMPLETE 4+ VIEW COMPARISON:  Knee radiograph 12/24/2019 FINDINGS: Right knee arthroplasty in unchanged alignment. No periprosthetic lucency or fracture. There is been patellar resurfacing. Large joint effusion, increased joint effusion since immediate postoperative exam. Previous intra-articular air has resolved. There are anterior skin staples in place with generalized soft tissue edema.  IMPRESSION: Recent right knee arthroplasty in unchanged alignment. Large joint effusion, increased joint effusion since immediate postoperative exam. Generalized soft tissue edema, nonspecific. Electronically Signed   By: Keith Rake M.D.   On: 12/28/2019 19:47        Scheduled Meds: . amitriptyline  100 mg Oral QHS  . calcium-vitamin D  1 tablet Oral Daily  . colchicine  0.6 mg Oral Daily  . docusate sodium  100 mg Oral BID  . famotidine  20 mg Oral BID  . folic acid  1 mg Oral Daily  . hydrocortisone  10 mg  Oral BID  . hyoscyamine  0.375 mg Oral Q12H  . multivitamin with minerals  1 tablet Oral Daily  . oxyCODONE  20 mg Oral Q8H  . pantoprazole  40 mg Oral Daily  . potassium chloride SA  20 mEq Oral BID  . rivaroxaban  20 mg Oral q morning - 10a  . sertraline  25 mg Oral Daily   Continuous Infusions: . sodium chloride 75 mL/hr at 12/29/19 0119     LOS: 0 days    Time spent: 33 mins     Wyvonnia Dusky, MD [facility] Pager [phone]  If 7PM-7AM, please contact night-coverage [email or web address] 12/29/2019, 7:45 AM

## 2019-12-29 NOTE — Consult Note (Signed)
Urology Consult  I have been asked to see the patient by Dr. Sidney Ace, for evaluation and management of left hydronephrosis.  Chief Complaint: Incidental left hydronephrosis  History of Present Illness: Joanna Hall is a 54 y.o. year old female with stage IV cervical cancer with lung metastasis and factor V Leiden mutation with history of blood clots in her brain, lungs, and right kidney.  She is s/p right total knee arthroplasty with Dr. [name] on 12/24/2019; postoperative period complicated by progressive right knee edema and concern for DVT with reassuring venous Doppler.  She presented to the ED yesterday with reports of right leg pain, swelling, and paresthesias.  CTAP with contrast revealed incidental moderate left hydroureteronephrosis to the level of soft tissue thickening in the distal left ureter, possibly the result of local recurrence at the level of the cervix versus a de novo process at the UVJ.  This represents a new change since her most recent CTAP on 09/22/2019.  Patient reports chronic nausea, vomiting, and abdominal and flank pain secondary to her malignancy.  She does not believe that this has changed or worsened in quality over the past several months.  Creatinine stable today at 0.82.   Past Medical History:  Diagnosis Date   Abdominal pain 06/10/2018   Abnormal cervical Papanicolaou smear 09/18/2017   Anxiety    Aortic atherosclerosis (HCC)    Arthritis    neck and knees   Blood clots in brain    both lungs and right kidney   Blood transfusion without reported diagnosis    Cervical cancer ([place]) 09/2016   mets lung   Chronic anal fissure    Chronic diarrhea    Dyspnea    Erosive gastropathy 09/18/2017   Factor V Leiden mutation ([place])    Fecal incontinence    Genital warts    GERD (gastroesophageal reflux disease)    GI bleed 10/08/2018   Heart murmur    Hematochezia    Hemorrhoids    Hepatitis C    Chronic, after IV drug abuse  about 20 years ago   Hepatitis, chronic ([place]) 05/05/2017   History of cancer chemotherapy    completed 06/2017   History of Clostridium difficile infection    while undergoing chemo.  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   Intestinal infection due to Clostridium difficile 09/18/2017   Macrocytic anemia with vitamin B12 deficiency    Multiple gastric ulcers    Nausea vomiting and diarrhea    Pancolitis ([place]) 07/27/2018   Perianal condylomata    Pneumonia    History of   Pulmonary nodules    Rectal bleeding    Small bowel obstruction ([place]) 08/2017   Stiff neck    limited right turn   Vitamin D deficiency     Past Surgical History:  Procedure Laterality Date   CHOLECYSTECTOMY     COLON SURGERY  08/2017   resection   COLONOSCOPY WITH PROPOFOL N/A 12/20/2017   Procedure: COLONOSCOPY WITH PROPOFOL;  Surgeon: [name], MD;  Location: [place];  Service: Gastroenterology;  Laterality: N/A;   COLONOSCOPY WITH PROPOFOL N/A 07/30/2018   Procedure: COLONOSCOPY WITH PROPOFOL;  Surgeon: [name], MD;  Location: [facility] ENDOSCOPY;  Service: Gastroenterology;  Laterality: N/A;   COLONOSCOPY WITH PROPOFOL N/A 10/10/2018   Procedure: COLONOSCOPY WITH PROPOFOL;  Surgeon: Lucilla Lame, MD;  Location: [facility] ENDOSCOPY;  Service: Endoscopy;  Laterality: N/A;   DIAGNOSTIC LAPAROSCOPY     ESOPHAGOGASTRODUODENOSCOPY (EGD) WITH PROPOFOL N/A 12/20/2017   Procedure: ESOPHAGOGASTRODUODENOSCOPY (EGD) WITH PROPOFOL;  Surgeon: [name], MD;  Location: [facility] ENDOSCOPY;  Service: Gastroenterology;  Laterality: N/A;   ESOPHAGOGASTRODUODENOSCOPY (EGD) WITH PROPOFOL  07/30/2018   Procedure: ESOPHAGOGASTRODUODENOSCOPY (EGD) WITH PROPOFOL;  Surgeon: [name], MD;  Location: [facility] ENDOSCOPY;  Service: Gastroenterology;;   [place] N/A 11/21/2018   Procedure: FLEXIBLE  SIGMOIDOSCOPY;  Surgeon: [name], MD;  Location[facility];  Service: Gastroenterology;  Laterality: N/A;   LAPAROTOMY N/A 08/31/2017   Procedure: EXPLORATORY LAPAROTOMY for SBO, ileocolectomy, removal of piece of uterine wall;  Surgeon: [name], MD;  Location: [facility] ORS;  Service: General;  Laterality: N/A;   LASER ABLATION CONDOLAMATA N/A 02/22/2018   Procedure: LASER ABLATION/REMOVAL OF RDEYCXKGYJE AROUND ANUS AND VAGINA;  Surgeon: Michael Boston, MD;  Location: [place];  Service: General;  Laterality: N/A;   OOPHORECTOMY     PORTA CATH INSERTION N/A 05/13/2018   Procedure: PORTA CATH INSERTION;  Surgeon: Katha Cabal, MD;  Location: [facility];  Service: Cardiovascular;  Laterality: N/A;   SMALL INTESTINE SURGERY     TANDEM RING INSERTION     x3   THORACOTOMY     TOTAL KNEE ARTHROPLASTY Left 04/24/2018   Procedure: TOTAL KNEE ARTHROPLASTY;  Surgeon: Lovell Sheehan, MD;  Location: [facility] ORS;  Service: Orthopedics;  Laterality: Left;   TOTAL KNEE ARTHROPLASTY Right 12/24/2019   Procedure: RIGHT TOTAL KNEE ARTHROPLASTY;  Surgeon: Lovell Sheehan, MD;  Location: [facility] ORS;  Service: Orthopedics;  Laterality: Right;    Home Medications:  Current Meds  Medication Sig   amitriptyline (ELAVIL) 100 MG tablet Take 1 tablet (100 mg total) by mouth at bedtime.   Calcium Carb-Cholecalciferol (CALCIUM 500 +D) 500-400 MG-UNIT TABS Take 1 tablet by mouth daily.    colchicine 0.6 MG tablet Take 0.6 mg by mouth daily.    DM-Phenylephrine-Acetaminophen (COLD RELIEF PO) Take 2 tablets by mouth daily as needed (cold symptoms).   docusate sodium (COLACE) 100 MG capsule Take 1 capsule (100 mg total) by mouth 2 (two) times daily.   famotidine (PEPCID) 20 MG tablet Take 1 tablet (20 mg total) by mouth 2 (two) times daily.   folic acid (FOLVITE) 1 MG tablet Take 1 tablet (1 mg total) by mouth daily.   hydrocortisone (CORTEF) 10 MG tablet Take  1 tablet (10 mg total) by mouth 2 (two) times daily.   hydrOXYzine (VISTARIL) 25 MG capsule Take 1 capsule (25 mg total) by mouth 3 (three) times daily as needed for itching.   hyoscyamine (LEVBID) 0.375 MG 12 hr tablet Take 1 tablet (0.375 mg total) by mouth 2 (two) times daily.   methocarbamol (ROBAXIN) 500 MG tablet Take 1 tablet (500 mg total) by mouth every 6 (six) hours as needed for muscle spasms.   Multiple Vitamins-Minerals (SUPER THERA VITE M) TABS Take 1 tablet by mouth daily.    ondansetron (ZOFRAN ODT) 4 MG disintegrating tablet Take 1 tablet (4 mg total) by mouth every 8 (eight) hours as needed for nausea or vomiting.   oxyCODONE (OXYCONTIN) 20 mg 12 hr tablet Take 1 tablet (20 mg total) by mouth every 8 (eight) hours.   oxyCODONE 10 MG TABS Take 1 tablet (10 mg total) by mouth every 4 (four) hours as needed (for breakthrough pain).   pantoprazole (PROTONIX) 40 MG tablet Take 1 tablet (40 mg total) by  mouth daily.   potassium chloride SA (KLOR-CON) 20 MEQ tablet Take 1 tablet (20 mEq total) by mouth 2 (two) times daily.   promethazine (PHENERGAN) 25 MG tablet Take 1 tablet (25 mg total) by mouth every 8 (eight) hours as needed for nausea or vomiting.   sertraline (ZOLOFT) 25 MG tablet Take 1 tablet (25 mg total) by mouth daily.   triamcinolone ointment (KENALOG) 0.5 % Apply 1 application topically 2 (two) times daily. (Patient taking differently: Apply 1 application topically 2 (two) times daily as needed (itching). )   VENTOLIN HFA 108 (90 Base) MCG/ACT inhaler Inhale 1-2 puffs into the lungs every 4 (four) hours as needed for shortness of breath.    Allergies:  Allergies  Allergen Reactions   Ketamine Anxiety and Other (See Comments)    Syncope episode/confusion     Family History  Problem Relation Age of Onset   Hypertension Mother    Hypertension Father    Diabetes Father    Hyperlipidemia Father    Alcohol abuse Daughter    Hypertension Maternal  Grandmother    Diabetes Maternal Grandmother    Diabetes Paternal Grandmother    Hypertension Paternal Grandmother    Social History:  reports that she quit smoking about 13 years ago. Her smoking use included cigarettes. She has a 2.50 pack-year smoking history. She has never used smokeless tobacco. She reports previous alcohol use. She reports current drug use. Drug: Marijuana.  ROS: A complete review of systems was performed.  All systems are negative except for pertinent findings as noted.  Physical Exam:  Vital signs in last 24 hours: Temp:  [97.6 F (36.4 C)-98.5 F (36.9 C)] 98.5 F (36.9 C) (03/15 0829) Pulse Rate:  [95-109] 109 (03/15 0829) Resp:  [18-20] 20 (03/15 0829) BP: (116-152)/(77-112) 151/90 (03/15 0829) SpO2:  [97 %-100 %] 99 % (03/15 0829) Weight:  [78 kg] 78 kg (03/14 1737) Constitutional:  Alert and oriented, no acute distress HEENT: [place] AT, moist mucus membranes.  Trachea midline Cardiovascular: No clubbing, cyanosis, or edema. Respiratory: Normal respiratory effort Neurologic: Grossly intact, no focal deficits, moving all 4 extremities Psychiatric: Normal mood and affect  Laboratory Data:  Recent Labs    12/27/19 1032 12/28/19 1724 12/29/19 0702  WBC 5.1 5.6 4.1  HGB 9.7* 9.2* 8.4*  HCT 29.0* 28.2* 25.5*   Recent Labs    12/27/19 1032 12/28/19 1724 12/29/19 0702  NA 133* 132* 131*  K 3.7 3.6 3.4*  CL 96* 96* 95*  CO2 26 26 24   GLUCOSE 78 71 89  BUN 9 10 7   CREATININE 0.80 0.87 0.82  CALCIUM 8.5* 8.5* 8.4*   Recent Labs    12/27/19 1032 12/28/19 2111  INR 1.0 2.7*   Urinalysis    Component Value Date/Time   COLORURINE AMBER (A) 12/22/2019 1014   APPEARANCEUR HAZY (A) 12/22/2019 1014   LABSPEC 1.023 12/22/2019 1014   PHURINE 5.0 12/22/2019 1014   GLUCOSEU NEGATIVE 12/22/2019 1014   [place] 12/22/2019 1014   BILIRUBINUR SMALL (A) 12/22/2019 1014   [place] 12/22/2019 1014   PROTEINUR 30 (A) 12/22/2019 1014    NITRITE NEGATIVE 12/22/2019 1014   LEUKOCYTESUR TRACE (A) 12/22/2019 1014   Results for orders placed or performed during the hospital encounter of 12/28/19  SARS CORONAVIRUS 2 (TAT 6-24 HRS) Nasopharyngeal Nasopharyngeal Swab     Status: None   Collection Time: 12/28/19 10:39 PM   Specimen: Nasopharyngeal Swab  Result Value Ref Range Status   SARS  Coronavirus 2 NEGATIVE NEGATIVE Final    Comment: (NOTE) SARS-CoV-2 target nucleic acids are NOT DETECTED. The SARS-CoV-2 RNA is generally detectable in upper and lower respiratory specimens during the acute phase of infection. Negative results do not preclude SARS-CoV-2 infection, do not rule out co-infections with other pathogens, and should not be used as the sole basis for treatment or other patient management decisions. Negative results must be combined with clinical observations, patient history, and epidemiological information. The expected result is Negative. Fact Sheet for Patients: SugarRoll.be Fact Sheet for Healthcare Providers: https://www.woods-mathews.com/ This test is not yet approved or cleared by the [place] FDA and  has been authorized for detection and/or diagnosis of SARS-CoV-2 by FDA under an Emergency Use Authorization (EUA). This EUA will remain  in effect (meaning this test can be used) for the duration of the COVID-19 declaration under Section 56 4(b)(1) of the Act, 21 U.S.C. section 360bbb-3(b)(1), unless the authorization is terminated or revoked sooner. Performed at [facility], [place] [address], [place], [place] [address]    *Note: Due to a large number of results and/or encounters for the requested time period, some results have not been displayed. A complete set of results can be found in Results Review.   Radiologic Imaging: CT ABDOMEN PELVIS W CONTRAST  Result Date: 12/29/2019 CLINICAL DATA:  Recent right knee replacement, concern for extending DVT  EXAM: CT ABDOMEN AND PELVIS WITH CONTRAST TECHNIQUE: Multidetector CT imaging of the abdomen and pelvis was performed using the standard protocol following bolus administration of intravenous contrast. CONTRAST:  171m OMNIPAQUE IOHEXOL 300 MG/ML  SOLN COMPARISON:  CT abdomen pelvis 09/22/2019 FINDINGS: Lower chest: There is an increasing size and number of the multiple centrally cavitating nodules in both lower lobes largest in the right lower lobe measuring up to 9.4 mm, previously 4 mm (2/30). No consolidation or effusion. Normal heart size. No pericardial effusion. Hepatobiliary: No focal liver abnormality is seen. Patient is post cholecystectomy. Slight prominence of the biliary tree likely related to reservoir effect. No calcified intraductal gallstones. Pancreas: Unremarkable. No pancreatic ductal dilatation or surrounding inflammatory changes. Spleen: Normal in size without focal abnormality. Adrenals/Urinary Tract: Normal adrenal glands. Moderate left hydroureteronephrosis to the level of soft tissue thickening in the distal left ureter (2/76) which is unclear if this arises de novo or is related to the adjacent soft tissue thickening and the level of the cervix in this patient with known cervical cancer. There is an associated delayed left nephrogram and delayed excretion on excretory phase imaging. No right urinary tract dilatation. No suspicious renal lesions or urolithiasis. Urinary bladder is otherwise unremarkable. Stomach/Bowel: Distal esophagus, stomach and duodenum are unremarkable. No small bowel dilatation or wall thickening. There are postsurgical changes from prior right hemicolectomy with patent anastomosis. Much of the proximal colon is fluid-filled with minimal formed stool distally. There is distal circumferential mural thickening of the sigmoid and rectum. Vascular/Lymphatic: Atherosclerotic plaque within the normal caliber aorta. No venous filling defects are visualized in the iliac veins  or IVC. No pathologic enlarged nodes seen in the abdomen or pelvis. Reproductive: Anteverted uterus. There is some irregular thickening at the level of the cervix which extends left anterolaterally with loss of discernible fat plane with the adjacent dilated left ureter (2/75). No concerning adnexal lesions. Other: There is small amount of presacral stranding and thickening, similar to the comparison studies. Postsurgical changes of the anterior abdominal wall are noted. Mild body wall edema. Musculoskeletal: Levocurvature of the lumbar spine  with an apex at L4 is similar to prior. Diffuse sclerotic changes are noted at the SI joints. Appearance is similar to prior. Chronic insufficiency fractures are noted bilaterally. There is asymmetric sclerosis of the left pubic body stable to comparison. IMPRESSION: 1. Moderate left hydroureteronephrosis to the level of soft tissue thickening in the distal left ureter which is possibly the result of local recurrence at the level of the cervix given loss of clearly discernible fat plane from the adjacent cervical soft tissues versus a de novo process at the ureterovesicular junction. 2. Interval increase in size and number of the multiple centrally cavitating nodules in both lower lobes largest in the right lower lobe measuring up to 9.4 mm, previously 4 mm. 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 3. Circumferential mural thickening of the sigmoid and rectum may represent infectious or inflammatory colitis. 4. Stable appearance of the presacral stranding and thickening, correlate for prior treatment related changes. 5. Sclerotic appearance of the left pubic body, questionable lucency may reflect a pathologic fracture of the appear similar to prior. 6. Bilateral sacral insufficiency fractures and sclerosis of the sacral ala. 7. Aortic Atherosclerosis (ICD10-I70.0). These results were called by telephone at the time of interpretation on 12/29/2019 at 12:03  am to provider Dr Joan Mayans, who verbally acknowledged these results. Electronically Signed   By: Lovena Le M.D.   On: 12/29/2019 00:03   I personally reviewed the imaging above and note the presence of new left hydronephrosis.  Assessment & Plan:  54 year old female with stage IV cervical cancer with lung metastasis and factor V Leiden mutation s/p total right knee arthroplasty presents with incidental new left hydronephrosis, likely due to external tumor compression at the level of the cervix versus UVJ.  I had a lengthy conversation with the patient today regarding these findings.  I explained the distal blockage or compression of the ureter is causing swelling of the left kidney.  I explained that while her creatinine remains stable at this time, worsening urinary obstruction could lead to worsening renal function and possible kidney failure.  She expressed understanding.  I explained the treatment options for her new left hydronephrosis include placement of an indwelling left ureteral stent to help drain urine from the kidney versus placement of a nephrostomy tube for external drainage from the kidney out of a tube through her back into a drainage bag.  I explained that she would likely retain the ureteral stent permanently, with plans for periodic stent exchanges in the operating room.  I explained that indwelling ureteral stents can be associated with discomfort in the flank or groin.  On the other hand, I explained that a nephrostomy tube would require maintenance of the drainage bag and routine tube exchanges with IR, however it is associated with less risk of flank discomfort.  Based on this, patient states she would prefer to undergo left nephrostomy tube placement primarily due to concerns for stent discomfort.  With stable creatinine, recommend proceeding with left PCN placement with IR on a nonurgent basis.  Given that this was an incidental finding and she has been hospitalized related to  her recent orthopedic surgery, okay to defer this on an outpatient basis if needed.  Recommend continuing daily BMPs; if she develops acute worsening of renal function or acute worsening of flank pain, recommend urgent PCN placement.  Thank you for involving me in this patient's care, I will continue to follow along.  Debroah Loop, PA-C 12/29/2019 1:19 PM

## 2019-12-30 ENCOUNTER — Encounter: Payer: Self-pay | Admitting: Internal Medicine

## 2019-12-30 ENCOUNTER — Inpatient Hospital Stay: Payer: Medicaid Other

## 2019-12-30 LAB — COMPREHENSIVE METABOLIC PANEL
ALT: 43 U/L (ref 0–44)
AST: 55 U/L — ABNORMAL HIGH (ref 15–41)
Albumin: 2.8 g/dL — ABNORMAL LOW (ref 3.5–5.0)
Alkaline Phosphatase: 211 U/L — ABNORMAL HIGH (ref 38–126)
Anion gap: 8 (ref 5–15)
BUN: 5 mg/dL — ABNORMAL LOW (ref 6–20)
CO2: 30 mmol/L (ref 22–32)
Calcium: 8.6 mg/dL — ABNORMAL LOW (ref 8.9–10.3)
Chloride: 97 mmol/L — ABNORMAL LOW (ref 98–111)
Creatinine, Ser: 0.67 mg/dL (ref 0.44–1.00)
GFR calc Af Amer: >60 mL/min (ref >60)
GFR calc non Af Amer: >60 mL/min (ref >60)
Glucose, Bld: 116 mg/dL — ABNORMAL HIGH (ref 70–99)
Potassium: 4 mmol/L (ref 3.5–5.1)
Sodium: 135 mmol/L (ref 135–145)
Total Bilirubin: 0.9 mg/dL (ref 0.3–1.2)
Total Protein: 6.3 g/dL — ABNORMAL LOW (ref 6.5–8.1)

## 2019-12-30 LAB — RETICULOCYTES
Immature Retic Fract: 22.2 % — ABNORMAL HIGH (ref 2.3–15.9)
RBC.: 2.65 MIL/uL — ABNORMAL LOW (ref 3.87–5.11)
Retic Count, Absolute: 102 10*3/uL (ref 19.0–186.0)
Retic Ct Pct: 3.9 % — ABNORMAL HIGH (ref 0.4–3.1)

## 2019-12-30 LAB — CBC
HCT: 26 % — ABNORMAL LOW (ref 36.0–46.0)
Hemoglobin: 8.6 g/dL — ABNORMAL LOW (ref 12.0–15.0)
MCH: 33.2 pg (ref 26.0–34.0)
MCHC: 33.1 g/dL (ref 30.0–36.0)
MCV: 100.4 fL — ABNORMAL HIGH (ref 80.0–100.0)
Platelets: 249 10*3/uL (ref 150–400)
RBC: 2.59 MIL/uL — ABNORMAL LOW (ref 3.87–5.11)
RDW: 12.9 % (ref 11.5–15.5)
WBC: 4.7 10*3/uL (ref 4.0–10.5)
nRBC: 0 % (ref 0.0–0.2)

## 2019-12-30 LAB — PROTIME-INR
INR: 1.1 (ref 0.8–1.2)
Prothrombin Time: 14.2 seconds (ref 11.4–15.2)

## 2019-12-30 LAB — APTT: aPTT: 44 seconds — ABNORMAL HIGH (ref 24–36)

## 2019-12-30 MED ORDER — IOHEXOL 300 MG/ML  SOLN
100.0000 mL | Freq: Once | INTRAMUSCULAR | Status: AC | PRN
  Administered 2019-12-30: 100 mL via INTRAVENOUS

## 2019-12-30 MED ORDER — HYDROMORPHONE HCL 1 MG/ML IJ SOLN
1.0000 mg | INTRAMUSCULAR | Status: DC | PRN
  Administered 2019-12-30 – 2019-12-31 (×4): 1 mg via INTRAVENOUS
  Filled 2019-12-30 (×4): qty 1

## 2019-12-30 MED ORDER — HYDROMORPHONE HCL 1 MG/ML IJ SOLN
0.5000 mg | Freq: Once | INTRAMUSCULAR | Status: AC
  Administered 2019-12-30: 0.5 mg via INTRAVENOUS
  Filled 2019-12-30: qty 1

## 2019-12-30 NOTE — Progress Notes (Signed)
Joanna Hall   DOB:23-Oct-1965   XQ#:[id number]    Subjective: Patient continues to complain of pain of the right lower extremity.  Complains of swelling.  No bleeding.  Objective:  Vitals:   12/30/19 0816 12/30/19 1531  BP: (!) 156/97 (!) 175/103  Pulse: (!) 102 97  Resp: 20   Temp: 97.8 F (36.6 C) 98.1 F (36.7 C)  SpO2: 96% 99%     Intake/Output Summary (Last 24 hours) at 12/30/2019 1616 Last data filed at 12/29/2019 1914 Gross per 24 hour  Intake 240 ml  Output --  Net 240 ml    Physical Exam  Constitutional: She is oriented to person, place, and time and well-developed, well-nourished, and in no distress.  HENT:  Head: Normocephalic and atraumatic.  Mouth/Throat: Oropharynx is clear and moist. No oropharyngeal exudate.  Eyes: Pupils are equal, round, and reactive to light.  Cardiovascular: Normal rate and regular rhythm.  Pulmonary/Chest: No respiratory distress. She has no wheezes.  Abdominal: Soft. Bowel sounds are normal. She exhibits no distension and no mass. There is no abdominal tenderness. There is no rebound and no guarding.  Musculoskeletal:        General: Edema present. No tenderness. Normal range of motion.     Cervical back: Normal range of motion and neck supple.     Comments: Swelling of the right lower extremity.  Pulses intact.  Neurological: She is alert and oriented to person, place, and time.  Skin:  Ecchymosis noted over the right lower extremity.   Psychiatric: Affect normal.     Labs:  Lab Results  Component Value Date   WBC 4.7 12/30/2019   HGB 8.6 (L) 12/30/2019   HCT 26.0 (L) 12/30/2019   MCV 100.4 (H) 12/30/2019   PLT 249 12/30/2019   NEUTROABS 4.2 12/28/2019    Lab Results  Component Value Date   NA 135 12/30/2019   K 4.0 12/30/2019   CL 97 (L) 12/30/2019   CO2 30 12/30/2019    Studies:  DG Tibia/Fibula Right  Result Date: 12/28/2019 CLINICAL DATA:  Postop pain, edema, and ecchymosis. Surgery on Wednesday, 4 days  ago. EXAM: RIGHT TIBIA AND FIBULA - 2 VIEW COMPARISON:  None. FINDINGS: Right knee arthroplasty in expected alignment. No periprosthetic lucency. Remainder of the tibia is intact. Fibula is normal. No periosteal reaction or bony destruction. Generalized soft tissue edema. Anterior skin staples about the knee. Calcifications in the soft tissues anteromedially are likely phleboliths. IMPRESSION: Generalized soft tissue edema of the lower leg. No acute osseous abnormality. Electronically Signed   By: Keith Rake M.D.   On: 12/28/2019 19:48   CT ABDOMEN PELVIS W CONTRAST  Result Date: 12/29/2019 CLINICAL DATA:  Recent right knee replacement, concern for extending DVT EXAM: CT ABDOMEN AND PELVIS WITH CONTRAST TECHNIQUE: Multidetector CT imaging of the abdomen and pelvis was performed using the standard protocol following bolus administration of intravenous contrast. CONTRAST:  153m OMNIPAQUE IOHEXOL 300 MG/ML  SOLN COMPARISON:  CT abdomen pelvis 09/22/2019 FINDINGS: Lower chest: There is an increasing size and number of the multiple centrally cavitating nodules in both lower lobes largest in the right lower lobe measuring up to 9.4 mm, previously 4 mm (2/30). No consolidation or effusion. Normal heart size. No pericardial effusion. Hepatobiliary: No focal liver abnormality is seen. Patient is post cholecystectomy. Slight prominence of the biliary tree likely related to reservoir effect. No calcified intraductal gallstones. Pancreas: Unremarkable. No pancreatic ductal dilatation or surrounding inflammatory changes. Spleen: Normal in size  without focal abnormality. Adrenals/Urinary Tract: Normal adrenal glands. Moderate left hydroureteronephrosis to the level of soft tissue thickening in the distal left ureter (2/76) which is unclear if this arises de novo or is related to the adjacent soft tissue thickening and the level of the cervix in this patient with known cervical cancer. There is an associated delayed  left nephrogram and delayed excretion on excretory phase imaging. No right urinary tract dilatation. No suspicious renal lesions or urolithiasis. Urinary bladder is otherwise unremarkable. Stomach/Bowel: Distal esophagus, stomach and duodenum are unremarkable. No small bowel dilatation or wall thickening. There are postsurgical changes from prior right hemicolectomy with patent anastomosis. Much of the proximal colon is fluid-filled with minimal formed stool distally. There is distal circumferential mural thickening of the sigmoid and rectum. Vascular/Lymphatic: Atherosclerotic plaque within the normal caliber aorta. No venous filling defects are visualized in the iliac veins or IVC. No pathologic enlarged nodes seen in the abdomen or pelvis. Reproductive: Anteverted uterus. There is some irregular thickening at the level of the cervix which extends left anterolaterally with loss of discernible fat plane with the adjacent dilated left ureter (2/75). No concerning adnexal lesions. Other: There is small amount of presacral stranding and thickening, similar to the comparison studies. Postsurgical changes of the anterior abdominal wall are noted. Mild body wall edema. Musculoskeletal: Levocurvature of the lumbar spine with an apex at L4 is similar to prior. Diffuse sclerotic changes are noted at the SI joints. Appearance is similar to prior. Chronic insufficiency fractures are noted bilaterally. There is asymmetric sclerosis of the left pubic body stable to comparison. IMPRESSION: 1. Moderate left hydroureteronephrosis to the level of soft tissue thickening in the distal left ureter which is possibly the result of local recurrence at the level of the cervix given loss of clearly discernible fat plane from the adjacent cervical soft tissues versus a de novo process at the ureterovesicular junction. 2. Interval increase in size and number of the multiple centrally cavitating nodules in both lower lobes largest in the  right lower lobe measuring up to 9.4 mm, previously 4 mm. 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 3. Circumferential mural thickening of the sigmoid and rectum may represent infectious or inflammatory colitis. 4. Stable appearance of the presacral stranding and thickening, correlate for prior treatment related changes. 5. Sclerotic appearance of the left pubic body, questionable lucency may reflect a pathologic fracture of the appear similar to prior. 6. Bilateral sacral insufficiency fractures and sclerosis of the sacral ala. 7. Aortic Atherosclerosis (ICD10-I70.0). These results were called by telephone at the time of interpretation on 12/29/2019 at 12:03 am to provider Dr Joan Mayans, who verbally acknowledged these results. Electronically Signed   By: Lovena Le M.D.   On: 12/29/2019 00:03   CT FEMUR RIGHT W CONTRAST  Result Date: 12/30/2019 CLINICAL DATA:  Right lower extremity pain and swelling. Right knee arthroplasty on 12/24/2019 EXAM: CT OF THE LOWER RIGHT EXTREMITY WITH CONTRAST TECHNIQUE: Multidetector CT imaging of the lower right extremity was performed according to the standard protocol following intravenous contrast administration. COMPARISON:  X-ray 12/28/2019, CT 12/28/2019, 09/22/2019 CONTRAST:  160m OMNIPAQUE IOHEXOL 300 MG/ML  SOLN FINDINGS: Bones/Joint/Cartilage Postsurgical changes from right total knee arthroplasty. Metallic streak artifact from hardware slightly degrades evaluation of the adjacent structures. No periprosthetic lucency or fracture is identified. There is a very large suprapatellar knee joint effusion with internal hyperdense contents suggesting hemarthrosis (series 6, image 273; series 10, image 94). Approximate measurements are 10 x 9 x  5 cm (volume = 200 cm^3). The visualized portion of the parasymphyseal left pubic bone again demonstrates patchy sclerosis with possible nondisplaced fracture line (series 5, image 73; series 7, image 86). Pubic  symphysis intact without diastasis. Right hip joint is intact without fracture or dislocation. Joint spaces maintained without significant arthropathy. Images of the right ankle and hindfoot demonstrate no acute fracture or malalignment. Joint spaces are preserved. Ligaments Suboptimally assessed by CT. Muscles and Tendons Preserved muscle bulk without atrophy or fatty infiltration. No intramuscular fluid collection. No tendinous abnormality identified within the limitations of CT. Soft tissues Small amount of fluid within the subcutaneous soft tissues overlying the anterior aspect of the thigh and lower leg, likely postoperative. No well-defined or drainable fluid collection within the subcutaneous soft tissues. No deep fascial fluid. No fascial thickening or enhancement. No inguinal lymphadenopathy. Vascular structures appear intact. IMPRESSION: 1. Very large suprapatellar knee joint effusion with internal hyperdense contents suggesting hemarthrosis (approximate volume 200 mL). 2. 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 3. Patchy sclerosis of the visualized portion of the parasymphyseal left pubic bone with possible nondisplaced fracture line, similar in appearance to the prior study 09/22/2019. Electronically Signed   By: Davina Poke D.O.   On: 12/30/2019 15:33   CT TIBIA FIBULA RIGHT W CONTRAST  Result Date: 12/30/2019 CLINICAL DATA:  Right lower extremity pain and swelling. Right knee arthroplasty on 12/24/2019 EXAM: CT OF THE LOWER RIGHT EXTREMITY WITH CONTRAST TECHNIQUE: Multidetector CT imaging of the lower right extremity was performed according to the standard protocol following intravenous contrast administration. COMPARISON:  X-ray 12/28/2019, CT 12/28/2019, 09/22/2019 CONTRAST:  128m OMNIPAQUE IOHEXOL 300 MG/ML  SOLN FINDINGS: Bones/Joint/Cartilage Postsurgical changes from right total knee arthroplasty. Metallic streak artifact from hardware slightly degrades evaluation  of the adjacent structures. No periprosthetic lucency or fracture is identified. There is a very large suprapatellar knee joint effusion with internal hyperdense contents suggesting hemarthrosis (series 6, image 273; series 10, image 94). Approximate measurements are 10 x 9 x 5 cm (volume = 200 cm^3). The visualized portion of the parasymphyseal left pubic bone again demonstrates patchy sclerosis with possible nondisplaced fracture line (series 5, image 73; series 7, image 86). Pubic symphysis intact without diastasis. Right hip joint is intact without fracture or dislocation. Joint spaces maintained without significant arthropathy. Images of the right ankle and hindfoot demonstrate no acute fracture or malalignment. Joint spaces are preserved. Ligaments Suboptimally assessed by CT. Muscles and Tendons Preserved muscle bulk without atrophy or fatty infiltration. No intramuscular fluid collection. No tendinous abnormality identified within the limitations of CT. Soft tissues Small amount of fluid within the subcutaneous soft tissues overlying the anterior aspect of the thigh and lower leg, likely postoperative. No well-defined or drainable fluid collection within the subcutaneous soft tissues. No deep fascial fluid. No fascial thickening or enhancement. No inguinal lymphadenopathy. Vascular structures appear intact. IMPRESSION: 1. Very large suprapatellar knee joint effusion with internal hyperdense contents suggesting hemarthrosis (approximate volume 200 mL). 2. 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 3. Patchy sclerosis of the visualized portion of the parasymphyseal left pubic bone with possible nondisplaced fracture line, similar in appearance to the prior study 09/22/2019. Electronically Signed   By: NDavina PokeD.O.   On: 12/30/2019 15:33   [place]ous Img Lower Unilateral Right  Result Date: 12/28/2019 CLINICAL DATA:  54year old female with recent right knee replacement  presenting with right lower extremity edema and ecchymosis. EXAM: Right LOWER EXTREMITY VENOUS DOPPLER ULTRASOUND TECHNIQUE: Gray-scale sonography  with compression, as well as color and duplex ultrasound, were performed to evaluate the deep venous system(s) from the level of the common femoral vein through the popliteal and proximal calf veins. COMPARISON:  Ultrasound dated 12/27/2019. FINDINGS: VENOUS Normal compressibility of the common femoral, superficial femoral, and popliteal veins, as well as the visualized calf veins. Visualized portions of profunda femoral vein and great saphenous vein unremarkable. No filling defects to suggest DVT on grayscale or color Doppler imaging. Doppler waveforms show normal direction of venous flow, normal respiratory phasicity and response to augmentation. Limited views of the contralateral common femoral vein are unremarkable. OTHER None. Limitations: none IMPRESSION: No femoropopliteal DVT nor evidence of DVT within the visualized calf veins. If clinical symptoms are inconsistent or if there are persistent or worsening symptoms, further imaging (possibly involving the iliac veins) may be warranted. Electronically Signed   By: Anner Crete M.D.   On: 12/28/2019 22:44   DG Knee Complete 4 Views Right  Result Date: 12/28/2019 CLINICAL DATA:  Postop pain, edema, and ecchymosis. Surgery on Wednesday, 4 days ago. EXAM: RIGHT KNEE - COMPLETE 4+ VIEW COMPARISON:  Knee radiograph 12/24/2019 FINDINGS: Right knee arthroplasty in unchanged alignment. No periprosthetic lucency or fracture. There is been patellar resurfacing. Large joint effusion, increased joint effusion since immediate postoperative exam. Previous intra-articular air has resolved. There are anterior skin staples in place with generalized soft tissue edema. IMPRESSION: Recent right knee arthroplasty in unchanged alignment. Large joint effusion, increased joint effusion since immediate postoperative exam. Generalized  soft tissue edema, nonspecific. Electronically Signed   By: Keith Rake M.D.   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  # Right lower extremity swelling/pain-status post right TKA-negative for DVT; concerning for hematoma; currently off anticoagulation.  Recommend CT scan for further evaluation.  #History of DVT PE/factor V Leiden-on indefinite anticoagulation.  Stable currently off anticoagulation/see above.  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  CT scan abdomen pelvis-shows progression of disease in the lungs; also progressive disease in the left pelvis.  Discussed that chemotherapy could be considered once patient is clinically stable/acute issues resolve.  #Left moderate hydronephrosis-likely secondary to recurrent disease in the pelvis.  For now renal function-stable; agree with urology recommendations regarding monitor closely for now.  Addendum: CT scan right lower extremity shows hematoma.  Left a message for Dr. [name].    Cammie Sickle, MD 12/30/2019  4:16 PM

## 2019-12-30 NOTE — Progress Notes (Signed)
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 Hematoma. S/p right total knee arthroplasty with worsening postoperative right knee effusion. CT right knee ordered. Ortho consulted and awaiting recs. Morphine prn for pain. [place] of RLE was neg for DVT.  Intractable nausea and vomiting: improving. etiology unclear, possibly related to narcotic intake. 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 Hold Xarelto as per onco.    Stage IV cervical cancer: with lung mets. Continue on home pain management & home steroid regimen. Onco following and recs apprec   Hydroureteronephrosis: moderate as per CT scan. Likely secondary to stage IV cervical cancer. No intervention at this time as per uro. Uro following and recs apprec   Transaminitis: etiology unclear, ALT is WNL & AST is still elevated but trending down. Will continue to monitor   Elevated alkaline phosphatase: etiology unclear, trending down. Will continue to monitor   Hypokalemia: WNL today. Will continue to monitor   Vitamin B12 deficiency: gets vitamin B12 every 3 weeks.  Anxiety: severity unknown. Xanax prn   DVT prophylaxis: xarelto  Code Status: DNR Family Communication:  Disposition Plan: will likely d/c back home when stable but will have PT/OT see the pt when appropriate as well    Consultants:   Oncology  Urology  Ortho surg   Procedures:   Antimicrobials:   Subjective: Pt c/o severe right knee pain still   Objective: Vitals:   12/29/19 0117 12/29/19 0829 12/29/19 1708 12/30/19 0008  BP: (!) 152/95 (!) 151/90 (!) 142/92 (!) 141/110  Pulse: 96 (!)  109 99 (!) 110  Resp: 18 20 16 18   Temp: 97.6 F (36.4 C) 98.5 F (36.9 C)  98.6 F (37 C)  TempSrc: Oral     SpO2: 100% 99% 100% 97%  Weight:      Height:        Intake/Output Summary (Last 24 hours) at 12/30/2019 0814 Last data filed at 12/29/2019 1914 Gross per 24 hour  Intake 1637.14 ml  Output --  Net 1637.14 ml   Filed Weights   12/28/19 1737  Weight: 78 kg    Examination:  General exam: Appears anxious & uncomfortable. Appears older than stated age  Respiratory system: decreased breath sounds b/l otherwise clear Cardiovascular system: S1 & S2 +. No  rubs, gallops or clicks. Gastrointestinal system: Abdomen is nondistended, soft and nontender.  Normal bowel sounds heard. Central nervous system: Alert and oriented. Moves all 4 extremities Psychiatry: Judgement and insight appear normal. Anxious mood and affect Extremities: RLE is edematous, tender to palpation & bruising noted     Data Reviewed: I have personally reviewed following labs and imaging studies  CBC: Recent Labs  Lab 12/25/19 0405 12/27/19 1032 12/28/19 1724 12/29/19 0702 12/30/19 0650  WBC 5.6 5.1 5.6 4.1 4.7  NEUTROABS  --  4.0 4.2  --   --   HGB 8.6* 9.7* 9.2* 8.4* 8.6*  HCT 25.6* 29.0* 28.2* 25.5* 26.0*  MCV 99.2 98.6 100.4* 99.2 100.4*  PLT 163 264 286 245 353   Basic Metabolic Panel: Recent Labs  Lab 12/25/19 0405 12/27/19 1032 12/28/19 1724 12/29/19  7858 12/30/19 0650  NA 135 133* 132* 131* 135  K 4.1 3.7 3.6 3.4* 4.0  CL 104 96* 96* 95* 97*  CO2 25 26 26 24 30   GLUCOSE 145* 78 71 89 116*  BUN 12 9 10 7  5*  CREATININE 1.16* 0.80 0.87 0.82 0.67  CALCIUM 8.3* 8.5* 8.5* 8.4* 8.6*   GFR: Estimated Creatinine Clearance: 89.5 mL/min (by C-G formula based on SCr of 0.67 mg/dL). Liver Function Tests: Recent Labs  Lab 12/27/19 1032 12/28/19 1724 12/30/19 0650  AST 80* 79* 55*  ALT 61* 58* 43  ALKPHOS 269* 241* 211*  BILITOT 1.5* 1.4* 0.9  PROT 6.9 6.7 6.3*  ALBUMIN 3.1*  3.1* 2.8*   No results for input(s): LIPASE, AMYLASE in the last 168 hours. No results for input(s): AMMONIA in the last 168 hours. Coagulation Profile: Recent Labs  Lab 12/27/19 1032 12/28/19 2111 12/29/19 2128  INR 1.0 2.7* 1.5*   Cardiac Enzymes: Recent Labs  Lab 12/28/19 2111  CKTOTAL 68   BNP (last 3 results) No results for input(s): PROBNP in the last 8760 hours. HbA1C: No results for input(s): HGBA1C in the last 72 hours. CBG: No results for input(s): GLUCAP in the last 168 hours. Lipid Profile: No results for input(s): CHOL, HDL, LDLCALC, TRIG, CHOLHDL, LDLDIRECT in the last 72 hours. Thyroid Function Tests: No results for input(s): TSH, T4TOTAL, FREET4, T3FREE, THYROIDAB in the last 72 hours. Anemia Panel: No results for input(s): VITAMINB12, FOLATE, FERRITIN, TIBC, IRON, RETICCTPCT in the last 72 hours. Sepsis Labs: Recent Labs  Lab 12/28/19 2111  LATICACIDVEN 0.8    Recent Results (from the past 240 hour(s))  Surgical PCR screen     Status: None   Collection Time: 12/24/19  6:26 AM   Specimen: Nasal Mucosa; Nasal Swab  Result Value Ref Range Status   MRSA, PCR NEGATIVE NEGATIVE Final   Staphylococcus aureus NEGATIVE NEGATIVE Final    Comment: (NOTE) The Xpert SA Assay (FDA approved for NASAL specimens in patients 68 years of age and older), is one component of a comprehensive surveillance program. It is not intended to diagnose infection nor to guide or monitor treatment. Performed at [facility], [place], [place] [address]   SARS CORONAVIRUS 2 (TAT 6-24 HRS) Nasopharyngeal Nasopharyngeal Swab     Status: None   Collection Time: 12/28/19 10:39 PM   Specimen: Nasopharyngeal Swab  Result Value Ref Range Status   SARS Coronavirus 2 NEGATIVE NEGATIVE Final    Comment: (NOTE) SARS-CoV-2 target nucleic acids are NOT DETECTED. The SARS-CoV-2 RNA is generally detectable in upper and lower respiratory specimens during the acute  phase of infection. Negative results do not preclude SARS-CoV-2 infection, do not rule out co-infections with other pathogens, and should not be used as the sole basis for treatment or other patient management decisions. Negative results must be combined with clinical observations, patient history, and epidemiological information. The expected result is Negative. Fact Sheet for Patients: SugarRoll.be Fact Sheet for Healthcare Providers: https://www.woods-mathews.com/ This test is not yet approved or cleared by the [place] FDA and  has been authorized for detection and/or diagnosis of SARS-CoV-2 by FDA under an Emergency Use Authorization (EUA). This EUA will remain  in effect (meaning this test can be used) for the duration of the COVID-19 declaration under Section 56 4(b)(1) of the Act, 21 U.S.C. section 360bbb-3(b)(1), unless the authorization is terminated or revoked sooner. Performed at [facility], [place] [address] [place], [place]  [address]          Radiology Studies: DG Tibia/Fibula Right  Result Date: 12/28/2019 CLINICAL DATA:  Postop pain, edema, and ecchymosis. Surgery on Wednesday, 4 days ago. EXAM: RIGHT TIBIA AND FIBULA - 2 VIEW COMPARISON:  None. FINDINGS: Right knee arthroplasty in expected alignment. No periprosthetic lucency. Remainder of the tibia is intact. Fibula is normal. No periosteal reaction or bony destruction. Generalized soft tissue edema. Anterior skin staples about the knee. Calcifications in the soft tissues anteromedially are likely phleboliths. IMPRESSION: Generalized soft tissue edema of the lower leg. No acute osseous abnormality. Electronically Signed   By: Keith Rake M.D.   On: 12/28/2019 19:48   CT ABDOMEN PELVIS W CONTRAST  Result Date: 12/29/2019 CLINICAL DATA:  Recent right knee replacement, concern for extending DVT EXAM: CT ABDOMEN AND PELVIS WITH CONTRAST TECHNIQUE: Multidetector CT  imaging of the abdomen and pelvis was performed using the standard protocol following bolus administration of intravenous contrast. CONTRAST:  177m OMNIPAQUE IOHEXOL 300 MG/ML  SOLN COMPARISON:  CT abdomen pelvis 09/22/2019 FINDINGS: Lower chest: There is an increasing size and number of the multiple centrally cavitating nodules in both lower lobes largest in the right lower lobe measuring up to 9.4 mm, previously 4 mm (2/30). No consolidation or effusion. Normal heart size. No pericardial effusion. Hepatobiliary: No focal liver abnormality is seen. Patient is post cholecystectomy. Slight prominence of the biliary tree likely related to reservoir effect. No calcified intraductal gallstones. Pancreas: Unremarkable. No pancreatic ductal dilatation or surrounding inflammatory changes. Spleen: Normal in size without focal abnormality. Adrenals/Urinary Tract: Normal adrenal glands. Moderate left hydroureteronephrosis to the level of soft tissue thickening in the distal left ureter (2/76) which is unclear if this arises de novo or is related to the adjacent soft tissue thickening and the level of the cervix in this patient with known cervical cancer. There is an associated delayed left nephrogram and delayed excretion on excretory phase imaging. No right urinary tract dilatation. No suspicious renal lesions or urolithiasis. Urinary bladder is otherwise unremarkable. Stomach/Bowel: Distal esophagus, stomach and duodenum are unremarkable. No small bowel dilatation or wall thickening. There are postsurgical changes from prior right hemicolectomy with patent anastomosis. Much of the proximal colon is fluid-filled with minimal formed stool distally. There is distal circumferential mural thickening of the sigmoid and rectum. Vascular/Lymphatic: Atherosclerotic plaque within the normal caliber aorta. No venous filling defects are visualized in the iliac veins or IVC. No pathologic enlarged nodes seen in the abdomen or pelvis.  Reproductive: Anteverted uterus. There is some irregular thickening at the level of the cervix which extends left anterolaterally with loss of discernible fat plane with the adjacent dilated left ureter (2/75). No concerning adnexal lesions. Other: There is small amount of presacral stranding and thickening, similar to the comparison studies. Postsurgical changes of the anterior abdominal wall are noted. Mild body wall edema. Musculoskeletal: Levocurvature of the lumbar spine with an apex at L4 is similar to prior. Diffuse sclerotic changes are noted at the SI joints. Appearance is similar to prior. Chronic insufficiency fractures are noted bilaterally. There is asymmetric sclerosis of the left pubic body stable to comparison. IMPRESSION: 1. Moderate left hydroureteronephrosis to the level of soft tissue thickening in the distal left ureter which is possibly the result of local recurrence at the level of the cervix given loss of clearly discernible fat plane from the adjacent cervical soft tissues versus a de novo process at the ureterovesicular junction. 2. Interval increase in size and number  of the multiple centrally cavitating nodules in both lower lobes largest in the right lower lobe measuring up to 9.4 mm, previously 4 mm. 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 3. Circumferential mural thickening of the sigmoid and rectum may represent infectious or inflammatory colitis. 4. Stable appearance of the presacral stranding and thickening, correlate for prior treatment related changes. 5. Sclerotic appearance of the left pubic body, questionable lucency may reflect a pathologic fracture of the appear similar to prior. 6. Bilateral sacral insufficiency fractures and sclerosis of the sacral ala. 7. Aortic Atherosclerosis (ICD10-I70.0). These results were called by telephone at the time of interpretation on 12/29/2019 at 12:03 am to provider Dr Joan Mayans, who verbally acknowledged these results.  Electronically Signed   By: Lovena Le M.D.   On: 12/29/2019 00:03   US Venous Img Lower Unilateral Right  Result Date: 12/28/2019 CLINICAL DATA:  54 year old female with recent right knee replacement presenting with right lower extremity edema and ecchymosis. EXAM: Right LOWER EXTREMITY VENOUS DOPPLER ULTRASOUND TECHNIQUE: Gray-scale sonography with compression, as well as color and duplex ultrasound, were performed to evaluate the deep venous system(s) from the level of the common femoral vein through the popliteal and proximal calf veins. COMPARISON:  Ultrasound dated 12/27/2019. FINDINGS: VENOUS Normal compressibility of the common femoral, superficial femoral, and popliteal veins, as well as the visualized calf veins. Visualized portions of profunda femoral vein and great saphenous vein unremarkable. No filling defects to suggest DVT on grayscale or color Doppler imaging. Doppler waveforms show normal direction of venous flow, normal respiratory phasicity and response to augmentation. Limited views of the contralateral common femoral vein are unremarkable. OTHER None. Limitations: none IMPRESSION: No femoropopliteal DVT nor evidence of DVT within the visualized calf veins. If clinical symptoms are inconsistent or if there are persistent or worsening symptoms, further imaging (possibly involving the iliac veins) may be warranted. Electronically Signed   By: Anner Crete M.D.   On: 12/28/2019 22:44   DG Knee Complete 4 Views Right  Result Date: 12/28/2019 CLINICAL DATA:  Postop pain, edema, and ecchymosis. Surgery on Wednesday, 4 days ago. EXAM: RIGHT KNEE - COMPLETE 4+ VIEW COMPARISON:  Knee radiograph 12/24/2019 FINDINGS: Right knee arthroplasty in unchanged alignment. No periprosthetic lucency or fracture. There is been patellar resurfacing. Large joint effusion, increased joint effusion since immediate postoperative exam. Previous intra-articular air has resolved. There are anterior skin staples  in place with generalized soft tissue edema. IMPRESSION: Recent right knee arthroplasty in unchanged alignment. Large joint effusion, increased joint effusion since immediate postoperative exam. Generalized soft tissue edema, nonspecific. Electronically Signed   By: Keith Rake M.D.   On: 12/28/2019 19:47        Scheduled Meds: . amitriptyline  100 mg Oral QHS  . calcium-vitamin D  1 tablet Oral Daily  . Chlorhexidine Gluconate Cloth  6 each Topical Daily  . colchicine  0.6 mg Oral Daily  . docusate sodium  100 mg Oral BID  . famotidine  20 mg Oral BID  . folic acid  1 mg Oral Daily  . hydrocortisone  10 mg Oral BID  . hyoscyamine  0.375 mg Oral Q12H  . multivitamin with minerals  1 tablet Oral Daily  . oxyCODONE  20 mg Oral Q8H  . pantoprazole  40 mg Oral Daily  . potassium chloride SA  20 mEq Oral BID  . sertraline  25 mg Oral Daily  . sodium chloride flush  10-40 mL Intracatheter Q12H   Continuous Infusions: .  sodium chloride 75 mL/hr at 12/29/19 2334     LOS: 1 day    Time spent: 33 mins     Wyvonnia Dusky, MD [facility] Pager [phone]  If 7PM-7AM, please contact night-coverage [email or web address] 12/30/2019, 8:14 AM

## 2019-12-30 NOTE — Plan of Care (Signed)

## 2019-12-30 NOTE — Progress Notes (Signed)
Subjective:  Patient reports pain as marked.    Objective:   VITALS:   Vitals:   12/29/19 1708 12/30/19 0008 12/30/19 0816 12/30/19 1531  BP: (!) 142/92 (!) 141/110 (!) 156/97 (!) 175/103  Pulse: 99 (!) 110 (!) 102 97  Resp: 16 18 20    Temp:  98.6 F (37 C) 97.8 F (36.6 C) 98.1 F (36.7 C)  TempSrc:   Oral Oral  SpO2: 100% 97% 96% 99%  Weight:      Height:        PHYSICAL EXAM:  Sensation intact distally Dorsiflexion/Plantar flexion intact Incision: dressing C/D/I Compartment soft  LABS  Results for orders placed or performed during the hospital encounter of 12/28/19 (from the past 24 hour(s))  Protime-INR     Status: Abnormal   Collection Time: 12/29/19  9:28 PM  Result Value Ref Range   Prothrombin Time 17.7 (H) 11.4 - 15.2 seconds   INR 1.5 (H) 0.8 - 1.2  APTT     Status: Abnormal   Collection Time: 12/29/19  9:28 PM  Result Value Ref Range   aPTT 99 (H) 24 - 36 seconds  Fibrinogen     Status: Abnormal   Collection Time: 12/29/19  9:28 PM  Result Value Ref Range   Fibrinogen 582 (H) 210 - 475 mg/dL  CBC     Status: Abnormal   Collection Time: 12/30/19  6:50 AM  Result Value Ref Range   WBC 4.7 4.0 - 10.5 K/uL   RBC 2.59 (L) 3.87 - 5.11 MIL/uL   Hemoglobin 8.6 (L) 12.0 - 15.0 g/dL   HCT 26.0 (L) 36.0 - 46.0 %   MCV 100.4 (H) 80.0 - 100.0 fL   MCH 33.2 26.0 - 34.0 pg   MCHC 33.1 30.0 - 36.0 g/dL   RDW 12.9 11.5 - 15.5 %   Platelets 249 150 - 400 K/uL   nRBC 0.0 0.0 - 0.2 %  Comprehensive metabolic panel     Status: Abnormal   Collection Time: 12/30/19  6:50 AM  Result Value Ref Range   Sodium 135 135 - 145 mmol/L   Potassium 4.0 3.5 - 5.1 mmol/L   Chloride 97 (L) 98 - 111 mmol/L   CO2 30 22 - 32 mmol/L   Glucose, Bld 116 (H) 70 - 99 mg/dL   BUN 5 (L) 6 - 20 mg/dL   Creatinine, Ser 0.67 0.44 - 1.00 mg/dL   Calcium 8.6 (L) 8.9 - 10.3 mg/dL   Total Protein 6.3 (L) 6.5 - 8.1 g/dL   Albumin 2.8 (L) 3.5 - 5.0 g/dL   AST 55 (H) 15 - 41 U/L   ALT 43  0 - 44 U/L   Alkaline Phosphatase 211 (H) 38 - 126 U/L   Total Bilirubin 0.9 0.3 - 1.2 mg/dL   GFR calc non Af Amer >60 >60 mL/min   GFR calc Af Amer >60 >60 mL/min   Anion gap 8 5 - 15  Reticulocytes     Status: Abnormal   Collection Time: 12/30/19  6:50 AM  Result Value Ref Range   Retic Ct Pct 3.9 (H) 0.4 - 3.1 %   RBC. 2.65 (L) 3.87 - 5.11 MIL/uL   Retic Count, Absolute 102.0 19.0 - 186.0 K/uL   Immature Retic Fract 22.2 (H) 2.3 - 15.9 %  APTT     Status: Abnormal   Collection Time: 12/30/19  8:58 AM  Result Value Ref Range   aPTT 44 (H) 24 - 36 seconds  Protime-INR     Status: None   Collection Time: 12/30/19  8:58 AM  Result Value Ref Range   Prothrombin Time 14.2 11.4 - 15.2 seconds   INR 1.1 0.8 - 1.2   *Note: Due to a large number of results and/or encounters for the requested time period, some results have not been displayed. A complete set of results can be found in Results Review.    DG Tibia/Fibula Right  Result Date: 12/28/2019 CLINICAL DATA:  Postop pain, edema, and ecchymosis. Surgery on Wednesday, 4 days ago. EXAM: RIGHT TIBIA AND FIBULA - 2 VIEW COMPARISON:  None. FINDINGS: Right knee arthroplasty in expected alignment. No periprosthetic lucency. Remainder of the tibia is intact. Fibula is normal. No periosteal reaction or bony destruction. Generalized soft tissue edema. Anterior skin staples about the knee. Calcifications in the soft tissues anteromedially are likely phleboliths. IMPRESSION: Generalized soft tissue edema of the lower leg. No acute osseous abnormality. Electronically Signed   By: Keith Rake M.D.   On: 12/28/2019 19:48   CT ABDOMEN PELVIS W CONTRAST  Result Date: 12/29/2019 CLINICAL DATA:  Recent right knee replacement, concern for extending DVT EXAM: CT ABDOMEN AND PELVIS WITH CONTRAST TECHNIQUE: Multidetector CT imaging of the abdomen and pelvis was performed using the standard protocol following bolus administration of intravenous contrast.  CONTRAST:  166m OMNIPAQUE IOHEXOL 300 MG/ML  SOLN COMPARISON:  CT abdomen pelvis 09/22/2019 FINDINGS: Lower chest: There is an increasing size and number of the multiple centrally cavitating nodules in both lower lobes largest in the right lower lobe measuring up to 9.4 mm, previously 4 mm (2/30). No consolidation or effusion. Normal heart size. No pericardial effusion. Hepatobiliary: No focal liver abnormality is seen. Patient is post cholecystectomy. Slight prominence of the biliary tree likely related to reservoir effect. No calcified intraductal gallstones. Pancreas: Unremarkable. No pancreatic ductal dilatation or surrounding inflammatory changes. Spleen: Normal in size without focal abnormality. Adrenals/Urinary Tract: Normal adrenal glands. Moderate left hydroureteronephrosis to the level of soft tissue thickening in the distal left ureter (2/76) which is unclear if this arises de novo or is related to the adjacent soft tissue thickening and the level of the cervix in this patient with known cervical cancer. There is an associated delayed left nephrogram and delayed excretion on excretory phase imaging. No right urinary tract dilatation. No suspicious renal lesions or urolithiasis. Urinary bladder is otherwise unremarkable. Stomach/Bowel: Distal esophagus, stomach and duodenum are unremarkable. No small bowel dilatation or wall thickening. There are postsurgical changes from prior right hemicolectomy with patent anastomosis. Much of the proximal colon is fluid-filled with minimal formed stool distally. There is distal circumferential mural thickening of the sigmoid and rectum. Vascular/Lymphatic: Atherosclerotic plaque within the normal caliber aorta. No venous filling defects are visualized in the iliac veins or IVC. No pathologic enlarged nodes seen in the abdomen or pelvis. Reproductive: Anteverted uterus. There is some irregular thickening at the level of the cervix which extends left anterolaterally  with loss of discernible fat plane with the adjacent dilated left ureter (2/75). No concerning adnexal lesions. Other: There is small amount of presacral stranding and thickening, similar to the comparison studies. Postsurgical changes of the anterior abdominal wall are noted. Mild body wall edema. Musculoskeletal: Levocurvature of the lumbar spine with an apex at L4 is similar to prior. Diffuse sclerotic changes are noted at the SI joints. Appearance is similar to prior. Chronic insufficiency fractures are noted bilaterally. There is asymmetric sclerosis of the left pubic body stable to  comparison. IMPRESSION: 1. Moderate left hydroureteronephrosis to the level of soft tissue thickening in the distal left ureter which is possibly the result of local recurrence at the level of the cervix given loss of clearly discernible fat plane from the adjacent cervical soft tissues versus a de novo process at the ureterovesicular junction. 2. Interval increase in size and number of the multiple centrally cavitating nodules in both lower lobes largest in the right lower lobe measuring up to 9.4 mm, previously 4 mm. 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 3. Circumferential mural thickening of the sigmoid and rectum may represent infectious or inflammatory colitis. 4. Stable appearance of the presacral stranding and thickening, correlate for prior treatment related changes. 5. Sclerotic appearance of the left pubic body, questionable lucency may reflect a pathologic fracture of the appear similar to prior. 6. Bilateral sacral insufficiency fractures and sclerosis of the sacral ala. 7. Aortic Atherosclerosis (ICD10-I70.0). These results were called by telephone at the time of interpretation on 12/29/2019 at 12:03 am to provider Dr Joan Mayans, who verbally acknowledged these results. Electronically Signed   By: Lovena Le M.D.   On: 12/29/2019 00:03   CT FEMUR RIGHT W CONTRAST  Result Date:  12/30/2019 CLINICAL DATA:  Right lower extremity pain and swelling. Right knee arthroplasty on 12/24/2019 EXAM: CT OF THE LOWER RIGHT EXTREMITY WITH CONTRAST TECHNIQUE: Multidetector CT imaging of the lower right extremity was performed according to the standard protocol following intravenous contrast administration. COMPARISON:  X-ray 12/28/2019, CT 12/28/2019, 09/22/2019 CONTRAST:  145m OMNIPAQUE IOHEXOL 300 MG/ML  SOLN FINDINGS: Bones/Joint/Cartilage Postsurgical changes from right total knee arthroplasty. Metallic streak artifact from hardware slightly degrades evaluation of the adjacent structures. No periprosthetic lucency or fracture is identified. There is a very large suprapatellar knee joint effusion with internal hyperdense contents suggesting hemarthrosis (series 6, image 273; series 10, image 94). Approximate measurements are 10 x 9 x 5 cm (volume = 200 cm^3). The visualized portion of the parasymphyseal left pubic bone again demonstrates patchy sclerosis with possible nondisplaced fracture line (series 5, image 73; series 7, image 86). Pubic symphysis intact without diastasis. Right hip joint is intact without fracture or dislocation. Joint spaces maintained without significant arthropathy. Images of the right ankle and hindfoot demonstrate no acute fracture or malalignment. Joint spaces are preserved. Ligaments Suboptimally assessed by CT. Muscles and Tendons Preserved muscle bulk without atrophy or fatty infiltration. No intramuscular fluid collection. No tendinous abnormality identified within the limitations of CT. Soft tissues Small amount of fluid within the subcutaneous soft tissues overlying the anterior aspect of the thigh and lower leg, likely postoperative. No well-defined or drainable fluid collection within the subcutaneous soft tissues. No deep fascial fluid. No fascial thickening or enhancement. No inguinal lymphadenopathy. Vascular structures appear intact. IMPRESSION: 1. Very large  suprapatellar knee joint effusion with internal hyperdense contents suggesting hemarthrosis (approximate volume 200 mL). 2. 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 3. Patchy sclerosis of the visualized portion of the parasymphyseal left pubic bone with possible nondisplaced fracture line, similar in appearance to the prior study 09/22/2019. Electronically Signed   By: NDavina PokeD.O.   On: 12/30/2019 15:33   CT TIBIA FIBULA RIGHT W CONTRAST  Result Date: 12/30/2019 CLINICAL DATA:  Right lower extremity pain and swelling. Right knee arthroplasty on 12/24/2019 EXAM: CT OF THE LOWER RIGHT EXTREMITY WITH CONTRAST TECHNIQUE: Multidetector CT imaging of the lower right extremity was performed according to the standard protocol following intravenous contrast administration. COMPARISON:  X-ray  12/28/2019, CT 12/28/2019, 09/22/2019 CONTRAST:  147m OMNIPAQUE IOHEXOL 300 MG/ML  SOLN FINDINGS: Bones/Joint/Cartilage Postsurgical changes from right total knee arthroplasty. Metallic streak artifact from hardware slightly degrades evaluation of the adjacent structures. No periprosthetic lucency or fracture is identified. There is a very large suprapatellar knee joint effusion with internal hyperdense contents suggesting hemarthrosis (series 6, image 273; series 10, image 94). Approximate measurements are 10 x 9 x 5 cm (volume = 200 cm^3). The visualized portion of the parasymphyseal left pubic bone again demonstrates patchy sclerosis with possible nondisplaced fracture line (series 5, image 73; series 7, image 86). Pubic symphysis intact without diastasis. Right hip joint is intact without fracture or dislocation. Joint spaces maintained without significant arthropathy. Images of the right ankle and hindfoot demonstrate no acute fracture or malalignment. Joint spaces are preserved. Ligaments Suboptimally assessed by CT. Muscles and Tendons Preserved muscle bulk without atrophy or fatty  infiltration. No intramuscular fluid collection. No tendinous abnormality identified within the limitations of CT. Soft tissues Small amount of fluid within the subcutaneous soft tissues overlying the anterior aspect of the thigh and lower leg, likely postoperative. No well-defined or drainable fluid collection within the subcutaneous soft tissues. No deep fascial fluid. No fascial thickening or enhancement. No inguinal lymphadenopathy. Vascular structures appear intact. IMPRESSION: 1. Very large suprapatellar knee joint effusion with internal hyperdense contents suggesting hemarthrosis (approximate volume 200 mL). 2. 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 3. Patchy sclerosis of the visualized portion of the parasymphyseal left pubic bone with possible nondisplaced fracture line, similar in appearance to the prior study 09/22/2019. Electronically Signed   By: NDavina PokeD.O.   On: 12/30/2019 15:33   [place]ous Img Lower Unilateral Right  Result Date: 12/28/2019 CLINICAL DATA:  54year old female with recent right knee replacement presenting with right lower extremity edema and ecchymosis. EXAM: Right LOWER EXTREMITY VENOUS DOPPLER ULTRASOUND TECHNIQUE: Gray-scale sonography with compression, as well as color and duplex ultrasound, were performed to evaluate the deep venous system(s) from the level of the common femoral vein through the popliteal and proximal calf veins. COMPARISON:  Ultrasound dated 12/27/2019. FINDINGS: VENOUS Normal compressibility of the common femoral, superficial femoral, and popliteal veins, as well as the visualized calf veins. Visualized portions of profunda femoral vein and great saphenous vein unremarkable. No filling defects to suggest DVT on grayscale or color Doppler imaging. Doppler waveforms show normal direction of venous flow, normal respiratory phasicity and response to augmentation. Limited views of the contralateral common femoral vein are  unremarkable. OTHER None. Limitations: none IMPRESSION: No femoropopliteal DVT nor evidence of DVT within the visualized calf veins. If clinical symptoms are inconsistent or if there are persistent or worsening symptoms, further imaging (possibly involving the iliac veins) may be warranted. Electronically Signed   By: AAnner CreteM.D.   On: 12/28/2019 22:44   DG Knee Complete 4 Views Right  Result Date: 12/28/2019 CLINICAL DATA:  Postop pain, edema, and ecchymosis. Surgery on Wednesday, 4 days ago. EXAM: RIGHT KNEE - COMPLETE 4+ VIEW COMPARISON:  Knee radiograph 12/24/2019 FINDINGS: Right knee arthroplasty in unchanged alignment. No periprosthetic lucency or fracture. There is been patellar resurfacing. Large joint effusion, increased joint effusion since immediate postoperative exam. Previous intra-articular air has resolved. There are anterior skin staples in place with generalized soft tissue edema. IMPRESSION: Recent right knee arthroplasty in unchanged alignment. Large joint effusion, increased joint effusion since immediate postoperative exam. Generalized soft tissue edema, nonspecific. Electronically Signed   By: MAurther LoftD.  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 Recommend continue physical therapy, ice, compression and elevation. Will follow.   Lovell Sheehan , MD 12/30/2019, 4:38 PM

## 2019-12-31 DIAGNOSIS — R7401 Elevation of levels of liver transaminase levels: Secondary | ICD-10-CM

## 2019-12-31 DIAGNOSIS — M79605 Pain in left leg: Secondary | ICD-10-CM

## 2019-12-31 LAB — COMPREHENSIVE METABOLIC PANEL
ALT: 45 U/L — ABNORMAL HIGH (ref 0–44)
AST: 59 U/L — ABNORMAL HIGH (ref 15–41)
Albumin: 3 g/dL — ABNORMAL LOW (ref 3.5–5.0)
Alkaline Phosphatase: 225 U/L — ABNORMAL HIGH (ref 38–126)
Anion gap: 9 (ref 5–15)
BUN: 5 mg/dL — ABNORMAL LOW (ref 6–20)
CO2: 29 mmol/L (ref 22–32)
Calcium: 9 mg/dL (ref 8.9–10.3)
Chloride: 99 mmol/L (ref 98–111)
Creatinine, Ser: 0.74 mg/dL (ref 0.44–1.00)
GFR calc Af Amer: >60 mL/min (ref >60)
GFR calc non Af Amer: >60 mL/min (ref >60)
Glucose, Bld: 118 mg/dL — ABNORMAL HIGH (ref 70–99)
Potassium: 4.2 mmol/L (ref 3.5–5.1)
Sodium: 137 mmol/L (ref 135–145)
Total Bilirubin: 1 mg/dL (ref 0.3–1.2)
Total Protein: 6.7 g/dL (ref 6.5–8.1)

## 2019-12-31 LAB — CBC
HCT: 27.1 % — ABNORMAL LOW (ref 36.0–46.0)
Hemoglobin: 8.9 g/dL — ABNORMAL LOW (ref 12.0–15.0)
MCH: 32.5 pg (ref 26.0–34.0)
MCHC: 32.8 g/dL (ref 30.0–36.0)
MCV: 98.9 fL (ref 80.0–100.0)
Platelets: 307 10*3/uL (ref 150–400)
RBC: 2.74 MIL/uL — ABNORMAL LOW (ref 3.87–5.11)
RDW: 13 % (ref 11.5–15.5)
WBC: 5.9 10*3/uL (ref 4.0–10.5)
nRBC: 0 % (ref 0.0–0.2)

## 2019-12-31 NOTE — Progress Notes (Signed)
PROGRESS NOTE    Joanna Hall  YYQ:[id number] DOB: Dec 17, 1965 DOA: 12/28/2019 PCP: Towanda Malkin, MD    Brief Narrative:  Joanna Hall  is a 54 y.o. 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 [name] and did fairly well to be discharged next day.  She continued to have right knee swelling that has been a gradual extending to her right leg over the last 3 days.  She was seen in the emergency room yesterday and had venous Doppler that showed no evidence for DVT above the knee but unfortunately the deep calf veins could not be evaluated.    Consultants:   Oncology, orthopedic  Procedures:  Antimicrobials:      Subjective Pt has leg elevated.  Complaining of pain.  No other issues.  T-max 100.5  Objective: Vitals:   12/30/19 2315 12/30/19 2325 12/31/19 0500 12/31/19 0815  BP: (!) 200/151 (!) 159/88  136/88  Pulse: (!) 109 99  95  Resp: 17   18  Temp: (!) 100.5 F (38.1 C)  97.6 F (36.4 C) 97.8 F (36.6 C)  TempSrc: Oral  Oral Oral  SpO2: 98%   99%  Weight:      Height:        Intake/Output Summary (Last 24 hours) at 12/31/2019 0823 Last data filed at 12/31/2019 0649 Gross per 24 hour  Intake --  Output 0 ml  Net 0 ml   Filed Weights   12/28/19 1737  Weight: 78 kg    Examination:  General exam: Appears calm and comfortable  Respiratory system: Clear to auscultation. Respiratory effort normal. Cardiovascular system: S1 & S2 heard, RRR. No JVD, murmurs, rubs, gallops or clicks.  Gastrointestinal system: Abdomen is nondistended, soft and nontender.  Normal bowel sounds heard. Central nervous system: Alert and oriented. Grossly intact Extremities: LLE with ecchymosis, edema. Warm to touch. No erythema. Skin: Warm  dry Psychiatry: Judgement and insight appear normal. Mood & affect appropriate.     Data Reviewed: I have personally reviewed following labs and imaging studies  CBC: Recent Labs  Lab 12/27/19 1032 12/28/19 1724 12/29/19 0702 12/30/19 0650 12/31/19 0557  WBC 5.1 5.6 4.1 4.7 5.9  NEUTROABS 4.0 4.2  --   --   --   HGB 9.7* 9.2* 8.4* 8.6* 8.9*  HCT 29.0* 28.2* 25.5* 26.0* 27.1*  MCV 98.6 100.4* 99.2 100.4* 98.9  PLT 264 286 245 249 888   Basic Metabolic Panel: Recent Labs  Lab 12/27/19 1032 12/28/19 1724 12/29/19 0702 12/30/19 0650 12/31/19 0557  NA 133* 132* 131* 135 137  K 3.7 3.6 3.4* 4.0 4.2  CL 96* 96* 95* 97* 99  CO2 26 26 24 30 29   GLUCOSE 78 71 89 116* 118*  BUN 9 10 7  5* 5*  CREATININE 0.80 0.87 0.82 0.67 0.74  CALCIUM 8.5* 8.5* 8.4* 8.6* 9.0   GFR: Estimated Creatinine Clearance: 89.5 mL/min (by C-G formula based on SCr of 0.74 mg/dL). Liver Function Tests: Recent Labs  Lab 12/27/19 1032 12/28/19 1724 12/30/19 0650 12/31/19 0557  AST 80* 79* 55* 59*  ALT 61* 58* 43 45*  ALKPHOS 269* 241* 211* 225*  BILITOT 1.5* 1.4* 0.9 1.0  PROT 6.9 6.7 6.3* 6.7  ALBUMIN 3.1* 3.1* 2.8* 3.0*   No results  for input(s): LIPASE, AMYLASE in the last 168 hours. No results for input(s): AMMONIA in the last 168 hours. Coagulation Profile: Recent Labs  Lab 12/27/19 1032 12/28/19 2111 12/29/19 2128 12/30/19 0858  INR 1.0 2.7* 1.5* 1.1   Cardiac Enzymes: Recent Labs  Lab 12/28/19 2111  CKTOTAL 68   BNP (last 3 results) No results for input(s): PROBNP in the last 8760 hours. HbA1C: No results for input(s): HGBA1C in the last 72 hours. CBG: No results for input(s): GLUCAP in the last 168 hours. Lipid Profile: No results for input(s): CHOL, HDL, LDLCALC, TRIG, CHOLHDL, LDLDIRECT in the last 72 hours. Thyroid Function Tests: No results for input(s): TSH, T4TOTAL, FREET4, T3FREE, THYROIDAB in the last 72 hours. Anemia Panel: Recent Labs    12/30/19 0650   RETICCTPCT 3.9*   Sepsis Labs: Recent Labs  Lab 12/28/19 2111  LATICACIDVEN 0.8    Recent Results (from the past 240 hour(s))  Surgical PCR screen     Status: None   Collection Time: 12/24/19  6:26 AM   Specimen: Nasal Mucosa; Nasal Swab  Result Value Ref Range Status   MRSA, PCR NEGATIVE NEGATIVE Final   Staphylococcus aureus NEGATIVE NEGATIVE Final    Comment: (NOTE) The Xpert SA Assay (FDA approved for NASAL specimens in patients 50 years of age and older), is one component of a comprehensive surveillance program. It is not intended to diagnose infection nor to guide or monitor treatment. Performed at [facility], [place], [place] [address]   SARS CORONAVIRUS 2 (TAT 6-24 HRS) Nasopharyngeal Nasopharyngeal Swab     Status: None   Collection Time: 12/28/19 10:39 PM   Specimen: Nasopharyngeal Swab  Result Value Ref Range Status   SARS Coronavirus 2 NEGATIVE NEGATIVE Final    Comment: (NOTE) SARS-CoV-2 target nucleic acids are NOT DETECTED. The SARS-CoV-2 RNA is generally detectable in upper and lower respiratory specimens during the acute phase of infection. Negative results do not preclude SARS-CoV-2 infection, do not rule out co-infections with other pathogens, and should not be used as the sole basis for treatment or other patient management decisions. Negative results must be combined with clinical observations, patient history, and epidemiological information. The expected result is Negative. Fact Sheet for Patients: SugarRoll.be Fact Sheet for Healthcare Providers: https://www.woods-mathews.com/ This test is not yet approved or cleared by the [place] FDA and  has been authorized for detection and/or diagnosis of SARS-CoV-2 by FDA under an Emergency Use Authorization (EUA). This EUA will remain  in effect (meaning this test can be used) for the duration of the COVID-19 declaration under  Section 56 4(b)(1) of the Act, 21 U.S.C. section 360bbb-3(b)(1), unless the authorization is terminated or revoked sooner. Performed at [facility], [place] [address], [place], [place] [address]          Radiology Studies: CT FEMUR RIGHT W CONTRAST  Result Date: 12/30/2019 CLINICAL DATA:  Right lower extremity pain and swelling. Right knee arthroplasty on 12/24/2019 EXAM: CT OF THE LOWER RIGHT EXTREMITY WITH CONTRAST TECHNIQUE: Multidetector CT imaging of the lower right extremity was performed according to the standard protocol following intravenous contrast administration. COMPARISON:  X-ray 12/28/2019, CT 12/28/2019, 09/22/2019 CONTRAST:  154m OMNIPAQUE IOHEXOL 300 MG/ML  SOLN FINDINGS: Bones/Joint/Cartilage Postsurgical changes from right total knee arthroplasty. Metallic streak artifact from hardware slightly degrades evaluation of the adjacent structures. No periprosthetic lucency or fracture is identified. There is a very large suprapatellar knee joint effusion with internal hyperdense contents suggesting hemarthrosis (  series 6, image 273; series 10, image 94). Approximate measurements are 10 x 9 x 5 cm (volume = 200 cm^3). The visualized portion of the parasymphyseal left pubic bone again demonstrates patchy sclerosis with possible nondisplaced fracture line (series 5, image 73; series 7, image 86). Pubic symphysis intact without diastasis. Right hip joint is intact without fracture or dislocation. Joint spaces maintained without significant arthropathy. Images of the right ankle and hindfoot demonstrate no acute fracture or malalignment. Joint spaces are preserved. Ligaments Suboptimally assessed by CT. Muscles and Tendons Preserved muscle bulk without atrophy or fatty infiltration. No intramuscular fluid collection. No tendinous abnormality identified within the limitations of CT. Soft tissues Small amount of fluid within the subcutaneous soft tissues overlying the anterior aspect of  the thigh and lower leg, likely postoperative. No well-defined or drainable fluid collection within the subcutaneous soft tissues. No deep fascial fluid. No fascial thickening or enhancement. No inguinal lymphadenopathy. Vascular structures appear intact. IMPRESSION: 1. Very large suprapatellar knee joint effusion with internal hyperdense contents suggesting hemarthrosis (approximate volume 200 mL). 2. 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 3. Patchy sclerosis of the visualized portion of the parasymphyseal left pubic bone with possible nondisplaced fracture line, similar in appearance to the prior study 09/22/2019. Electronically Signed   By: Davina Poke D.O.   On: 12/30/2019 15:33   CT TIBIA FIBULA RIGHT W CONTRAST  Result Date: 12/30/2019 CLINICAL DATA:  Right lower extremity pain and swelling. Right knee arthroplasty on 12/24/2019 EXAM: CT OF THE LOWER RIGHT EXTREMITY WITH CONTRAST TECHNIQUE: Multidetector CT imaging of the lower right extremity was performed according to the standard protocol following intravenous contrast administration. COMPARISON:  X-ray 12/28/2019, CT 12/28/2019, 09/22/2019 CONTRAST:  176m OMNIPAQUE IOHEXOL 300 MG/ML  SOLN FINDINGS: Bones/Joint/Cartilage Postsurgical changes from right total knee arthroplasty. Metallic streak artifact from hardware slightly degrades evaluation of the adjacent structures. No periprosthetic lucency or fracture is identified. There is a very large suprapatellar knee joint effusion with internal hyperdense contents suggesting hemarthrosis (series 6, image 273; series 10, image 94). Approximate measurements are 10 x 9 x 5 cm (volume = 200 cm^3). The visualized portion of the parasymphyseal left pubic bone again demonstrates patchy sclerosis with possible nondisplaced fracture line (series 5, image 73; series 7, image 86). Pubic symphysis intact without diastasis. Right hip joint is intact without fracture or dislocation. Joint  spaces maintained without significant arthropathy. Images of the right ankle and hindfoot demonstrate no acute fracture or malalignment. Joint spaces are preserved. Ligaments Suboptimally assessed by CT. Muscles and Tendons Preserved muscle bulk without atrophy or fatty infiltration. No intramuscular fluid collection. No tendinous abnormality identified within the limitations of CT. Soft tissues Small amount of fluid within the subcutaneous soft tissues overlying the anterior aspect of the thigh and lower leg, likely postoperative. No well-defined or drainable fluid collection within the subcutaneous soft tissues. No deep fascial fluid. No fascial thickening or enhancement. No inguinal lymphadenopathy. Vascular structures appear intact. IMPRESSION: 1. Very large suprapatellar knee joint effusion with internal hyperdense contents suggesting hemarthrosis (approximate volume 200 mL). 2. 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 3. Patchy sclerosis of the visualized portion of the parasymphyseal left pubic bone with possible nondisplaced fracture line, similar in appearance to the prior study 09/22/2019. Electronically Signed   By: NDavina PokeD.O.   On: 12/30/2019 15:33        Scheduled Meds: . amitriptyline  100 mg Oral QHS  . calcium-vitamin D  1 tablet Oral Daily  .  Chlorhexidine Gluconate Cloth  6 each Topical Daily  . colchicine  0.6 mg Oral Daily  . docusate sodium  100 mg Oral BID  . famotidine  20 mg Oral BID  . folic acid  1 mg Oral Daily  . hydrocortisone  10 mg Oral BID  . hyoscyamine  0.375 mg Oral Q12H  . multivitamin with minerals  1 tablet Oral Daily  . oxyCODONE  20 mg Oral Q8H  . pantoprazole  40 mg Oral Daily  . potassium chloride SA  20 mEq Oral BID  . sertraline  25 mg Oral Daily  . sodium chloride flush  10-40 mL Intracatheter Q12H   Continuous Infusions: . sodium chloride 75 mL/hr at 12/30/19 0944    Assessment & Plan:   Active Problems:    Malignant neoplasm of overlapping sites of cervix (HCC)   Leg pain   Hydroureter   Transaminitis   Intractable right knee and leg pain: likely from hemarthrosis from a/c and recent surgery. S/p right total knee arthroplasty with worsening postoperative right knee effusion. Morphine prn for pain. [place] of RLE was neg for DVT. A/c on hold. Per oncology via chat, pt can be off for 2 weeks until f/u with oncology as outpt to decide then.  Intractable nausea and vomiting: etiology unclear, possibly related to narcotic intake. Zofran prn . Has resolved  Factor V Leiden mutation: with history of thromboembolism including PE, embolic CVA and right kidney thrombosis. Hold Xarelto as per onco.   Stage IV cervical cancer: with lung mets. Continue on home pain management & home steroid regimen.   Hydroureteronephrosis: moderate as per CT scan. Likely secondary to stage IV cervical cancer. Uro following and recs apprec .ureteral stent or nephrostomy tube could add significant unneeded morbidity to her overall situation. If the hydronephrosis worsened, creatinine increased or there was some concern for infection of the upper left urinary tract , can revisit the issue. Urology also recommend periodic renal ultrasound in a few weeks.  Will need f/u as outpt.  Transaminitis: etiology unclear, trending down. Will continue to monitor   Hypokalemia: KCl repleated. Will continue to monitor   Vitamin B12 deficiency: gets vitamin B12 every 3 weeks.  Anxiety: severity unknown. Xanax prn    DVT prophylaxis: scd Code Status: DNR Family Communication: none at bedside Disposition Plan: plan d/c home in am. Needs home PT.  Barrier: still with pain of LE , will d/c iv pain med. D/c in am        LOS: 2 days   Time spent:45 min with >50 % coc    Nolberto Hanlon, MD [facility] Pager [phone]  If 7PM-7AM, please contact night-coverage [email or web address] Password [facility] 12/31/2019, 8:23 AM

## 2019-12-31 NOTE — TOC Progression Note (Signed)
Transition of Care [facility]) - Progression Note    Patient Details  Name: Joanna Hall MRN: [id number] Date of Birth: May 12, 1966  Transition of Care [facility]) CM/SW [place], RN Phone Number: 12/31/2019, 2:14 PM  Clinical Narrative:    The patient is independent at home and in the room, no obvious needs, CM will continue to monitor        Expected Discharge Plan and Services                                                 Social Determinants of Health (SDOH) Interventions    Readmission Risk Interventions Readmission Risk Prevention Plan 12/30/2019 09/26/2019 07/24/2019  Transportation Screening Complete Complete Complete  Medication Review Press photographer) Complete Complete Referral to Pharmacy  PCP or Specialist appointment within 3-5 days of discharge Complete - Complete  HRI or Home Care Consult Complete - Complete  SW Recovery Care/Counseling Consult Complete - Complete  Palliative Care Screening Not Applicable - Not [place] - Not Applicable Not Applicable  Some recent data might be hidden

## 2019-12-31 NOTE — Evaluation (Signed)
Physical Therapy Evaluation Patient Details Name: Joanna Hall MRN: [id number] DOB: 12/17/1965 Today's Date: 12/31/2019   History of Present Illness  Laelani Vasko is a 54 yo female s/p RTKA, WBAT. 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  Clinical Impression  Pt admitted with above diagnosis. Pt currently with functional limitations due to the deficits listed below (see "PT Problem List"). Upon entry, pt in bed, awake and agreeable to participate. Pt c/o fifteen/10 pain, distressed regarding her total limb echymosis and warmth of her knee. The pt is alert and oriented x4, pleasant, conversational, and generally a good historian. She is impulsive throughout session and has poor safety awareness, reluctant to follow recommendations for safety (not willing to use RW in room, rather than use IV pole for independent mobility to BR), almost pulls out her IV multiple times due to inattention. All mobility performed without assist. Pt has sudden progressive worsening of pain during gait, begins sobbing and returns to room. AMB limited to 5f, whereas she AMB 2074fprior to DC a few days ago. Functional mobility assessment demonstrates increased effort/time requirements, poor tolerance, and need for physical assistance, whereas the patient performed these at a higher level of independence PTA. Pt asks for pain and nausea meds at end of session, RN reporting later than she has laready had these recently, unable to have more. Pt will benefit from skilled PT intervention to increase independence and safety with basic mobility in preparation for discharge to the venue listed below.       Follow Up Recommendations Home health PT;Supervision for mobility/OOB    Equipment Recommendations  None recommended by PT    Recommendations for Other Services       Precautions / Restrictions  Precautions Precautions: Fall;Knee Precaution Booklet Issued: No Restrictions Weight Bearing Restrictions: Yes RLE Weight Bearing: Weight bearing as tolerated      Mobility  Bed Mobility Overal bed mobility: Independent Bed Mobility: Supine to Sit     Supine to sit: Independent     General bed mobility comments: uses leg hook technique, needs no cues.  Transfers Overall transfer level: Needs assistance Equipment used: Rolling walker (2 wheeled) Transfers: Sit to/from Stand Sit to Stand: Supervision         General transfer comment: impulsive, stands without RW twice, reminded to use RW  Ambulation/Gait Ambulation/Gait assistance: Supervision Gait Distance (Feet): 70 Feet Assistive device: Rolling walker (2 wheeled) Gait Pattern/deviations: Decreased stride length;Step-through pattern     General Gait Details: slightly impulsive, 3-point gait, antalgic loading on RLE, avoids TKE in stance. Pt AMB has sudden severe pain exacerbation, begins sobbing and elects to turns around return to room.  Stairs            Wheelchair Mobility    Modified Rankin (Stroke Patients Only)       Balance                                             Pertinent Vitals/Pain Pain Assessment: (15/10, worse after AMB) Pain Intervention(s): Limited activity within patient's tolerance;Monitored during session;Premedicated before session;Patient requesting pain meds-RN notified    Home Living Family/patient expects to be discharged to:: Private residence Living Arrangements: Parent;Children;Other relatives Available Help at Discharge: Available 24 hours/day Type of Home: House Home Access: Stairs to  enter   Entrance Stairs-Number of Steps: 1 step Home Layout: Two level;Able to live on main level with bedroom/bathroom Home Equipment: Grab bars - tub/shower;Bedside commode;Walker - 2 wheels      Prior Function Level of Independence: Independent          Comments: parents help her out as needed, they perform a lot of the cooking/cleaning     Hand Dominance        Extremity/Trunk Assessment   Upper Extremity Assessment Upper Extremity Assessment: Overall WFL for tasks assessed    Lower Extremity Assessment Lower Extremity Assessment: Overall WFL for tasks assessed;Generalized weakness RLE Deficits / Details: s/p R TKA LLE Deficits / Details: WFLs, previous L TKA    Cervical / Trunk Assessment Cervical / Trunk Assessment: Normal  Communication   Communication: No difficulties  Cognition Arousal/Alertness: Awake/alert Behavior During Therapy: WFL for tasks assessed/performed Overall Cognitive Status: Within Functional Limits for tasks assessed                                        General Comments      Exercises     Assessment/Plan    PT Assessment Patient needs continued PT services  PT Problem List Decreased mobility;Decreased strength;Decreased knowledge of precautions;Decreased activity tolerance;Decreased balance;Pain;Decreased knowledge of use of DME       PT Treatment Interventions DME instruction;Therapeutic exercise;Gait training;Balance training;Stair training;Neuromuscular re-education;Functional mobility training;Therapeutic activities;Patient/family education    PT Goals (Current goals can be found in the Care Plan section)  Acute Rehab PT Goals Patient Stated Goal: to return to PLOF PT Goal Formulation: With patient Time For Goal Achievement: 01/21/20 Potential to Achieve Goals: Good    Frequency 7X/week   Barriers to discharge        Co-evaluation               AM-PAC PT "6 Clicks" Mobility  Outcome Measure Help needed turning from your back to your side while in a flat bed without using bedrails?: None Help needed moving from lying on your back to sitting on the side of a flat bed without using bedrails?: None Help needed moving to and from a bed to a chair  (including a wheelchair)?: A Little Help needed standing up from a chair using your arms (e.g., wheelchair or bedside chair)?: A Little Help needed to walk in hospital room?: A Little Help needed climbing 3-5 steps with a railing? : A Little 6 Click Score: 20    End of Session Equipment Utilized During Treatment: Gait belt Activity Tolerance: Patient limited by pain Patient left: in chair;with call bell/phone within reach;with chair alarm set Nurse Communication: Mobility status;Patient requests pain meds PT Visit Diagnosis: Difficulty in walking, not elsewhere classified (R26.2);Pain;Other abnormalities of gait and mobility (R26.89) Pain - Right/Left: Right Pain - part of body: Knee    Time: 1114-1130 PT Time Calculation (min) (ACUTE ONLY): 16 min   Charges:   PT Evaluation $PT Eval Moderate Complexity: 1 Mod          12:29 PM, 12/31/19 Etta Grandchild, PT, DPT Physical Therapist - [facility]  [phone] ([place])   [place] 12/31/2019, 12:25 PM

## 2019-12-31 NOTE — Progress Notes (Signed)
Pt education given regarding incision, removing dressing and touching incision increasing infection risk. Pt stated "that's not going to happen, she will continue to touch leg."

## 2019-12-31 NOTE — TOC Transition Note (Signed)
Transition of Care [facility]) - CM/SW Discharge Note   Patient Details  Name: Joanna Hall MRN: [id number] Date of Birth: 05/26/1966  Transition of Care [facility]) CM/SW Contact:  Su Hilt, RN Phone Number: 12/31/2019, 3:29 PM   Clinical Narrative:     Patient lives at home with her parents and has all the DME she needs at home, she was set u [facility]orm[facility] Russell after the last admission for the knee surgery I called [facility] to confirm that they are open with the patient  Janett Billow from [facility] called and confirmed that they are open with them, she will cotninue        Patient Goals and CMS Choice        Discharge Placement                       Discharge Plan and Services                                     Social Determinants of Health (SDOH) Interventions     Readmission Risk Interventions Readmission Risk Prevention Plan 12/30/2019 09/26/2019 07/24/2019  Transportation Screening Complete Complete Complete  Medication Review (RN Care Manager) Complete Complete Referral to Pharmacy  PCP or Specialist appointment within 3-5 days of discharge Complete - Complete  HRI or Home Care Consult Complete - Complete  SW Recovery Care/Counseling Consult Complete - Complete  Palliative Care Screening Not Applicable - Not [place] - Not Applicable Not Applicable  Some recent data might be hidden

## 2020-01-01 ENCOUNTER — Encounter
Admission: EM | Disposition: A | Discharge status: Home Health | Payer: Self-pay | Source: Home / Self Care | Attending: Internal Medicine

## 2020-01-01 ENCOUNTER — Other Ambulatory Visit (INDEPENDENT_AMBULATORY_CARE_PROVIDER_SITE_OTHER): Payer: Self-pay | Admitting: Vascular Surgery

## 2020-01-01 ENCOUNTER — Encounter: Payer: Self-pay | Admitting: Cardiology

## 2020-01-01 ENCOUNTER — Encounter: Payer: Self-pay | Admitting: Internal Medicine

## 2020-01-01 DIAGNOSIS — Z86718 Personal history of other venous thrombosis and embolism: Secondary | ICD-10-CM

## 2020-01-01 DIAGNOSIS — Z96651 Presence of right artificial knee joint: Secondary | ICD-10-CM

## 2020-01-01 DIAGNOSIS — Z79899 Other long term (current) drug therapy: Secondary | ICD-10-CM

## 2020-01-01 DIAGNOSIS — S8001XA Contusion of right knee, initial encounter: Secondary | ICD-10-CM

## 2020-01-01 DIAGNOSIS — M79604 Pain in right leg: Secondary | ICD-10-CM

## 2020-01-01 HISTORY — PX: IVC FILTER INSERTION: CATH118245

## 2020-01-01 SURGERY — IVC FILTER INSERTION
Anesthesia: Moderate Sedation

## 2020-01-01 MED ORDER — METHYLPREDNISOLONE SODIUM SUCC 125 MG IJ SOLR: 125.0000 mg | Freq: Once | INTRAMUSCULAR | Status: DC | PRN

## 2020-01-01 MED ORDER — MIDAZOLAM HCL 5 MG/5ML IJ SOLN
INTRAMUSCULAR | Status: AC
  Filled 2020-01-01: qty 5

## 2020-01-01 MED ORDER — DIPHENHYDRAMINE HCL 50 MG/ML IJ SOLN: 50.0000 mg | Freq: Once | INTRAMUSCULAR | Status: DC | PRN

## 2020-01-01 MED ORDER — SODIUM CHLORIDE 0.9 % IV SOLN: INTRAVENOUS | Status: DC

## 2020-01-01 MED ORDER — ONDANSETRON HCL 4 MG/2ML IJ SOLN
4.0000 mg | Freq: Four times a day (QID) | INTRAMUSCULAR | Status: DC | PRN
  Administered 2020-01-02: 4 mg via INTRAVENOUS
  Filled 2020-01-01 (×2): qty 2

## 2020-01-01 MED ORDER — MIDAZOLAM HCL 2 MG/2ML IJ SOLN
INTRAMUSCULAR | Status: DC | PRN
  Administered 2020-01-01: 2 mg via INTRAVENOUS

## 2020-01-01 MED ORDER — HYDROMORPHONE HCL 1 MG/ML IJ SOLN
1.0000 mg | Freq: Once | INTRAMUSCULAR | Status: AC | PRN
  Administered 2020-01-02: 1 mg via INTRAVENOUS
  Filled 2020-01-01: qty 1

## 2020-01-01 MED ORDER — MIDAZOLAM HCL 2 MG/ML PO SYRP
8.0000 mg | ORAL_SOLUTION | Freq: Once | ORAL | Status: DC | PRN
  Filled 2020-01-01: qty 4

## 2020-01-01 MED ORDER — FENTANYL CITRATE (PF) 100 MCG/2ML IJ SOLN
INTRAMUSCULAR | Status: AC
  Filled 2020-01-01: qty 2

## 2020-01-01 MED ORDER — FAMOTIDINE 20 MG PO TABS: 40.0000 mg | ORAL_TABLET | Freq: Once | ORAL | Status: DC | PRN

## 2020-01-01 MED ORDER — CEFAZOLIN SODIUM-DEXTROSE 2-4 GM/100ML-% IV SOLN
2.0000 g | Freq: Once | INTRAVENOUS | Status: AC
  Administered 2020-01-01: 2 g via INTRAVENOUS
  Filled 2020-01-01: qty 100

## 2020-01-01 MED ORDER — IODIXANOL 320 MG/ML IV SOLN
INTRAVENOUS | Status: DC | PRN
  Administered 2020-01-01: 13:00:00 10 mL

## 2020-01-01 MED ORDER — FENTANYL CITRATE (PF) 100 MCG/2ML IJ SOLN
INTRAMUSCULAR | Status: DC | PRN
  Administered 2020-01-01: 50 ug via INTRAVENOUS

## 2020-01-01 MED ORDER — HEPARIN SODIUM (PORCINE) 1000 UNIT/ML IJ SOLN
INTRAMUSCULAR | Status: AC
  Filled 2020-01-01: qty 1

## 2020-01-01 MED ORDER — OXYCODONE HCL ER 15 MG PO T12A
30.0000 mg | EXTENDED_RELEASE_TABLET | Freq: Three times a day (TID) | ORAL | Status: DC
  Administered 2020-01-01 – 2020-01-02 (×2): 30 mg via ORAL
  Filled 2020-01-01 (×2): qty 2

## 2020-01-01 MED ORDER — OXYCODONE HCL 5 MG PO TABS
15.0000 mg | ORAL_TABLET | ORAL | Status: DC | PRN
  Administered 2020-01-01 – 2020-01-02 (×2): 15 mg via ORAL
  Filled 2020-01-01 (×3): qty 3

## 2020-01-01 SURGICAL SUPPLY — 4 items
COVER PROBE U/S 5X48 (MISCELLANEOUS) ×2 IMPLANT
KIT FEMORAL DEL DENALI (Miscellaneous) ×2 IMPLANT
PACK ANGIOGRAPHY (CUSTOM PROCEDURE TRAY) ×2 IMPLANT
WIRE J 3MM .035X145CM (WIRE) ×4 IMPLANT

## 2020-01-01 NOTE — Progress Notes (Signed)
PROGRESS NOTE    EILAH COMMON  YDX:[id number] DOB: [birth year] DOA: 12/28/2019 PCP: Towanda Malkin, MD    Brief Narrative:  Joanna Hall  is a 54 y.o. 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 [name] and did fairly well to be discharged next day.  She continued to have right knee swelling that has been a gradual extending to her right leg over the last 3 days.  She was seen in the emergency room yesterday and had venous Doppler that showed no evidence for DVT above the knee but unfortunately the deep calf veins could not be evaluated.    Consultants:   Oncology, orthopedic  Procedures:  Antimicrobials:      Subjective  Ambulated with PT. C/o leg pain. Asking to up her pain meds. No new complaints. Objective: Vitals:   12/31/19 0500 12/31/19 0815 12/31/19 1618 12/31/19 2304  BP:  136/88 (!) 147/95 (!) 141/89  Pulse:  95 (!) 101 (!) 104  Resp:  18 18 16   Temp: 97.6 F (36.4 C) 97.8 F (36.6 C) 99.7 F (37.6 C) 99 F (37.2 C)  TempSrc: Oral Oral Oral Oral  SpO2:  99% 98% 96%  Weight:      Height:        Intake/Output Summary (Last 24 hours) at 01/01/2020 0827 Last data filed at 12/31/2019 1851 Gross per 24 hour  Intake 480 ml  Output --  Net 480 ml   Filed Weights   12/28/19 1737  Weight: 78 kg    Examination:  General exam: Appears calm and comfortable , nad Respiratory system: Clear to auscultation. Respiratory effort normal. Cardiovascular system: S1 & S2 heard, RRR. No JVD, murmurs, rubs, gallops or clicks.  Gastrointestinal system: Abdomen is nondistended, soft and nontender.  Normal bowel sounds heard. Central nervous system: Alert and oriented. Grossly intact Extremities: LLE with ecchymosis, edema. Warm to  touch. No erythema. Skin: Warm dry Psychiatry: Judgement and insight appear normal. Mood & affect appropriate.     Data Reviewed: I have personally reviewed following labs and imaging studies  CBC: Recent Labs  Lab 12/27/19 1032 12/28/19 1724 12/29/19 0702 12/30/19 0650 12/31/19 0557  WBC 5.1 5.6 4.1 4.7 5.9  NEUTROABS 4.0 4.2  --   --   --   HGB 9.7* 9.2* 8.4* 8.6* 8.9*  HCT 29.0* 28.2* 25.5* 26.0* 27.1*  MCV 98.6 100.4* 99.2 100.4* 98.9  PLT 264 286 245 249 720   Basic Metabolic Panel: Recent Labs  Lab 12/27/19 1032 12/28/19 1724 12/29/19 0702 12/30/19 0650 12/31/19 0557  NA 133* 132* 131* 135 137  K 3.7 3.6 3.4* 4.0 4.2  CL 96* 96* 95* 97* 99  CO2 26 26 24 30 29   GLUCOSE 78 71 89 116* 118*  BUN 9 10 7  5* 5*  CREATININE 0.80 0.87 0.82 0.67 0.74  CALCIUM 8.5* 8.5* 8.4* 8.6* 9.0   GFR: Estimated Creatinine Clearance: 89.5 mL/min (by C-G formula based on SCr of 0.74 mg/dL). Liver Function Tests: Recent Labs  Lab 12/27/19 1032 12/28/19 1724 12/30/19 0650 12/31/19 0557  AST 80* 79* 55* 59*  ALT 61* 58* 43 45*  ALKPHOS 269* 241* 211* 225*  BILITOT 1.5* 1.4* 0.9 1.0  PROT 6.9 6.7 6.3* 6.7  ALBUMIN 3.1* 3.1* 2.8* 3.0*  No results for input(s): LIPASE, AMYLASE in the last 168 hours. No results for input(s): AMMONIA in the last 168 hours. Coagulation Profile: Recent Labs  Lab 12/27/19 1032 12/28/19 2111 12/29/19 2128 12/30/19 0858  INR 1.0 2.7* 1.5* 1.1   Cardiac Enzymes: Recent Labs  Lab 12/28/19 2111  CKTOTAL 68   BNP (last 3 results) No results for input(s): PROBNP in the last 8760 hours. HbA1C: No results for input(s): HGBA1C in the last 72 hours. CBG: No results for input(s): GLUCAP in the last 168 hours. Lipid Profile: No results for input(s): CHOL, HDL, LDLCALC, TRIG, CHOLHDL, LDLDIRECT in the last 72 hours. Thyroid Function Tests: No results for input(s): TSH, T4TOTAL, FREET4, T3FREE, THYROIDAB in the last 72 hours. Anemia  Panel: Recent Labs    12/30/19 0650  RETICCTPCT 3.9*   Sepsis Labs: Recent Labs  Lab 12/28/19 2111  LATICACIDVEN 0.8    Recent Results (from the past 240 hour(s))  Surgical PCR screen     Status: None   Collection Time: 12/24/19  6:26 AM   Specimen: Nasal Mucosa; Nasal Swab  Result Value Ref Range Status   MRSA, PCR NEGATIVE NEGATIVE Final   Staphylococcus aureus NEGATIVE NEGATIVE Final    Comment: (NOTE) The Xpert SA Assay (FDA approved for NASAL specimens in patients 74 years of age and older), is one component of a comprehensive surveillance program. It is not intended to diagnose infection nor to guide or monitor treatment. Performed at [facility], [place], [place] [address]   SARS CORONAVIRUS 2 (TAT 6-24 HRS) Nasopharyngeal Nasopharyngeal Swab     Status: None   Collection Time: 12/28/19 10:39 PM   Specimen: Nasopharyngeal Swab  Result Value Ref Range Status   SARS Coronavirus 2 NEGATIVE NEGATIVE Final    Comment: (NOTE) SARS-CoV-2 target nucleic acids are NOT DETECTED. The SARS-CoV-2 RNA is generally detectable in upper and lower respiratory specimens during the acute phase of infection. Negative results do not preclude SARS-CoV-2 infection, do not rule out co-infections with other pathogens, and should not be used as the sole basis for treatment or other patient management decisions. Negative results must be combined with clinical observations, patient history, and epidemiological information. The expected result is Negative. Fact Sheet for Patients: SugarRoll.be Fact Sheet for Healthcare Providers: https://www.woods-mathews.com/ This test is not yet approved or cleared by the [place] FDA and  has been authorized for detection and/or diagnosis of SARS-CoV-2 by FDA under an Emergency Use Authorization (EUA). This EUA will remain  in effect (meaning this test can be used) for the duration  of the COVID-19 declaration under Section 56 4(b)(1) of the Act, 21 U.S.C. section 360bbb-3(b)(1), unless the authorization is terminated or revoked sooner. Performed at [facility], [place] [address] [place], [place] [address]          Radiology Studies: CT FEMUR RIGHT W CONTRAST  Result Date: 12/30/2019 CLINICAL DATA:  Right lower extremity pain and swelling. Right knee arthroplasty on 12/24/2019 EXAM: CT OF THE LOWER RIGHT EXTREMITY WITH CONTRAST TECHNIQUE: Multidetector CT imaging of the lower right extremity was performed according to the standard protocol following intravenous contrast administration. COMPARISON:  X-ray 12/28/2019, CT 12/28/2019, 09/22/2019 CONTRAST:  127m OMNIPAQUE IOHEXOL 300 MG/ML  SOLN FINDINGS: Bones/Joint/Cartilage Postsurgical changes from right total knee arthroplasty. Metallic streak artifact from hardware slightly degrades evaluation of the adjacent structures. No periprosthetic lucency or fracture is identified. There is a very large suprapatellar knee joint effusion with internal hyperdense contents  suggesting hemarthrosis (series 6, image 273; series 10, image 94). Approximate measurements are 10 x 9 x 5 cm (volume = 200 cm^3). The visualized portion of the parasymphyseal left pubic bone again demonstrates patchy sclerosis with possible nondisplaced fracture line (series 5, image 73; series 7, image 86). Pubic symphysis intact without diastasis. Right hip joint is intact without fracture or dislocation. Joint spaces maintained without significant arthropathy. Images of the right ankle and hindfoot demonstrate no acute fracture or malalignment. Joint spaces are preserved. Ligaments Suboptimally assessed by CT. Muscles and Tendons Preserved muscle bulk without atrophy or fatty infiltration. No intramuscular fluid collection. No tendinous abnormality identified within the limitations of CT. Soft tissues Small amount of fluid within the subcutaneous soft tissues  overlying the anterior aspect of the thigh and lower leg, likely postoperative. No well-defined or drainable fluid collection within the subcutaneous soft tissues. No deep fascial fluid. No fascial thickening or enhancement. No inguinal lymphadenopathy. Vascular structures appear intact. IMPRESSION: 1. Very large suprapatellar knee joint effusion with internal hyperdense contents suggesting hemarthrosis (approximate volume 200 mL). 2. 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 3. Patchy sclerosis of the visualized portion of the parasymphyseal left pubic bone with possible nondisplaced fracture line, similar in appearance to the prior study 09/22/2019. Electronically Signed   By: Davina Poke D.O.   On: 12/30/2019 15:33   CT TIBIA FIBULA RIGHT W CONTRAST  Result Date: 12/30/2019 CLINICAL DATA:  Right lower extremity pain and swelling. Right knee arthroplasty on 12/24/2019 EXAM: CT OF THE LOWER RIGHT EXTREMITY WITH CONTRAST TECHNIQUE: Multidetector CT imaging of the lower right extremity was performed according to the standard protocol following intravenous contrast administration. COMPARISON:  X-ray 12/28/2019, CT 12/28/2019, 09/22/2019 CONTRAST:  126m OMNIPAQUE IOHEXOL 300 MG/ML  SOLN FINDINGS: Bones/Joint/Cartilage Postsurgical changes from right total knee arthroplasty. Metallic streak artifact from hardware slightly degrades evaluation of the adjacent structures. No periprosthetic lucency or fracture is identified. There is a very large suprapatellar knee joint effusion with internal hyperdense contents suggesting hemarthrosis (series 6, image 273; series 10, image 94). Approximate measurements are 10 x 9 x 5 cm (volume = 200 cm^3). The visualized portion of the parasymphyseal left pubic bone again demonstrates patchy sclerosis with possible nondisplaced fracture line (series 5, image 73; series 7, image 86). Pubic symphysis intact without diastasis. Right hip joint is intact without  fracture or dislocation. Joint spaces maintained without significant arthropathy. Images of the right ankle and hindfoot demonstrate no acute fracture or malalignment. Joint spaces are preserved. Ligaments Suboptimally assessed by CT. Muscles and Tendons Preserved muscle bulk without atrophy or fatty infiltration. No intramuscular fluid collection. No tendinous abnormality identified within the limitations of CT. Soft tissues Small amount of fluid within the subcutaneous soft tissues overlying the anterior aspect of the thigh and lower leg, likely postoperative. No well-defined or drainable fluid collection within the subcutaneous soft tissues. No deep fascial fluid. No fascial thickening or enhancement. No inguinal lymphadenopathy. Vascular structures appear intact. IMPRESSION: 1. Very large suprapatellar knee joint effusion with internal hyperdense contents suggesting hemarthrosis (approximate volume 200 mL). 2. 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 3. Patchy sclerosis of the visualized portion of the parasymphyseal left pubic bone with possible nondisplaced fracture line, similar in appearance to the prior study 09/22/2019. Electronically Signed   By: NDavina PokeD.O.   On: 12/30/2019 15:33        Scheduled Meds: . amitriptyline  100 mg Oral QHS  . calcium-vitamin D  1 tablet Oral  Daily  . Chlorhexidine Gluconate Cloth  6 each Topical Daily  . colchicine  0.6 mg Oral Daily  . docusate sodium  100 mg Oral BID  . famotidine  20 mg Oral BID  . folic acid  1 mg Oral Daily  . hydrocortisone  10 mg Oral BID  . hyoscyamine  0.375 mg Oral Q12H  . multivitamin with minerals  1 tablet Oral Daily  . oxyCODONE  20 mg Oral Q8H  . pantoprazole  40 mg Oral Daily  . potassium chloride SA  20 mEq Oral BID  . sertraline  25 mg Oral Daily  . sodium chloride flush  10-40 mL Intracatheter Q12H   Continuous Infusions:   Assessment & Plan:   Active Problems:   Malignant  neoplasm of overlapping sites of cervix (HCC)   Leg pain   Hydroureter   Transaminitis   Intractable right knee and leg pain: likely from hemarthrosis from a/c and recent surgery. S/p right total knee arthroplasty with worsening postoperative right knee effusion. Morphine prn for pain. [place] of RLE was neg for DVT. A/c on hold. Oncology recommends IVC filter, vascular consulted.   Intractable nausea and vomiting: etiology unclear, possibly related to narcotic intake. Zofran prn . Has resolved  Factor V Leiden mutation: with history of thromboembolism including PE, embolic CVA and right kidney thrombosis. Hold Xarelto as per onco.   Stage IV cervical cancer: with lung mets. Continue on home pain management & home steroid regimen.   Hydroureteronephrosis: moderate as per CT scan. Likely secondary to stage IV cervical cancer. Uro following and recs apprec .ureteral stent or nephrostomy tube could add significant unneeded morbidity to her overall situation. If the hydronephrosis worsened, creatinine increased or there was some concern for infection of the upper left urinary tract , can revisit the issue. Urology also recommend periodic renal ultrasound in a few weeks.  Will need f/u as outpt.  Transaminitis: etiology unclear, trending down. Will continue to monitor   Hypokalemia: KCl repleated. Will continue to monitor   Vitamin B12 deficiency: gets vitamin B12 every 3 weeks.  Anxiety: severity unknown. Xanax prn    DVT prophylaxis: scd Code Status: DNR Family Communication: none at bedside Disposition Plan: plan d/c home in am. Needs home PT. Having IVC filter placed today Barrier: still with pain of LE , needs IVC filter       LOS: 3 days   Time spent:45 min with >50 % coc    Nolberto Hanlon, MD [facility] Pager [phone]  If 7PM-7AM, please contact night-coverage [email or web address] Password [facility] 01/01/2020, 8:27 AM Patient ID: [id number] CASEBEER, female    DOB: 29-Jul-1966, 54 y.o.   MRN: [id number]

## 2020-01-01 NOTE — H&P (Signed)
[facility] History & Physical Update  The patient was interviewed and re-examined.  The patient's previous History and Physical has been reviewed and is unchanged.  There is no change in the plan of care. We plan to proceed with the scheduled procedure.  Leotis Pain, MD  01/01/2020, 12:58 PM

## 2020-01-01 NOTE — Consult Note (Signed)
Joanna Hall is a 54 y.o. (1966/02/26) female who presents with chief complaint of  Chief Complaint  Patient presents with  . Leg Pain   History of Present Illness:  The patient is a 54 year old female with multiple medical issues including metastatic cervical cancer and factor V Leiden who presented to [facility] for scheduled right total knee replacement on December 24, 2019.  She returned back to [facility] December 27, 2019 complaining progressively worsening swelling and pain of the right leg.  No DVT was found on ultrasound at that time.  The patient was discharged home.  Patient returned to the [facility] emergency department again on December 28, 2019 complaining of right lower extremity pain and swelling and paresthesia since surgery.  Again, a venous duplex of the right lower extremity on December 28, 2019 was negative for DVT.  Patient was admitted for pain control.  Found to have no DVT but hematoma on CT.  Oncology was consulted who recommended discontinuing anticoagulation for least 2 weeks.  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  She has known factor V Leiden.  Patient was discharged on Lovenox with plan to start Xarelto on 12/29/19.  She is on chronic oral anticoagulation due to her past DVT history/factor V Leiden status.  Patient denies any other symptoms such as fever, nausea vomiting.  She denies any shortness of breath or chest pain.  Vascular surgery was consulted by Dr. Kurtis Bushman for placement of an IVC filter due to the patient's past medical history of DVT/factor V [place] status and inability to be on oral anticoagulation for at least 2 weeks moving forward.  Current Facility-Administered Medications  Medication Dose Route Frequency Provider Last Rate Last Admin  . 0.9 %  sodium chloride infusion   Intravenous Continuous  Syrai Gladwin, Janaye A, PA-C      . acetaminophen (TYLENOL) tablet 650 mg  650 mg Oral Q6H PRN Mansy, Jan A, MD   650 mg at 01/01/20 1130   Or  . acetaminophen (TYLENOL) suppository 650 mg  650 mg Rectal Q6H PRN Mansy, Jan A, MD      . albuterol (PROVENTIL) (2.5 MG/3ML) 0.083% nebulizer solution 2.5 mg  2.5 mg Inhalation Q4H PRN Mansy, Jan A, MD      . ALPRAZolam Duanne Moron) tablet 0.5 mg  0.5 mg Oral TID PRN Wyvonnia Dusky, MD   0.5 mg at 01/01/20 1130  . amitriptyline (ELAVIL) tablet 100 mg  100 mg Oral QHS Mansy, Jan A, MD   100 mg at 12/31/19 2108  . calcium-vitamin D (OSCAL WITH D) 500-200 MG-UNIT per tablet 1 tablet  1 tablet Oral Daily Mansy, Jan A, MD   1 tablet at 12/31/19 [phone]  . Chlorhexidine Gluconate Cloth 2 % PADS 6 each  6 each Topical Daily Wyvonnia Dusky, MD   6 each at 01/01/20 1002  . colchicine tablet 0.6 mg  0.6 mg Oral Daily Mansy, Jan A, MD   0.6 mg at 12/31/19 0923  . docusate sodium (COLACE) capsule 100 mg  100 mg Oral BID Mansy, Jan A, MD   100 mg at 12/31/19 2109  . famotidine (PEPCID) tablet 20 mg  20 mg Oral BID Mansy, Jan A, MD   20 mg at 12/31/19 2108  . folic acid (FOLVITE) tablet 1 mg  1 mg Oral Daily Mansy, Arvella Merles, MD  1 mg at 12/31/19 0923  . hydrocortisone (CORTEF) tablet 10 mg  10 mg Oral BID Mansy, Jan A, MD   10 mg at 12/31/19 2109  . hydrOXYzine (ATARAX/VISTARIL) tablet 25 mg  25 mg Oral TID PRN Mansy, Jan A, MD   25 mg at 12/29/19 2330  . hyoscyamine (LEVBID) 0.375 MG 12 hr tablet 0.375 mg  0.375 mg Oral Q12H Mansy, Jan A, MD   0.375 mg at 12/31/19 2109  . magnesium hydroxide (MILK OF MAGNESIA) suspension 30 mL  30 mL Oral Daily PRN Mansy, Jan A, MD   30 mL at 01/01/20 0754  . methocarbamol (ROBAXIN) tablet 500 mg  500 mg Oral Q6H PRN Mansy, Jan A, MD   500 mg at 01/01/20 0754  . multivitamin with minerals tablet 1 tablet  1 tablet Oral Daily Mansy, Jan A, MD   1 tablet at 12/31/19 0923  . ondansetron (ZOFRAN) tablet 4 mg  4 mg Oral Q6H PRN Mansy, Jan  A, MD   4 mg at 12/31/19 1541   Or  . ondansetron (ZOFRAN) injection 4 mg  4 mg Intravenous Q6H PRN Mansy, Jan A, MD   4 mg at 12/30/19 2329  . oxyCODONE (Oxy IR/ROXICODONE) immediate release tablet 10 mg  10 mg Oral Q4H PRN Mansy, Jan A, MD   10 mg at 01/01/20 1025  . oxyCODONE (OXYCONTIN) 12 hr tablet 20 mg  20 mg Oral Q8H Mansy, Jan A, MD   20 mg at 01/01/20 0607  . pantoprazole (PROTONIX) EC tablet 40 mg  40 mg Oral Daily Mansy, Jan A, MD   40 mg at 12/31/19 0924  . potassium chloride SA (KLOR-CON) CR tablet 20 mEq  20 mEq Oral BID Mansy, Jan A, MD   20 mEq at 12/31/19 2108  . sertraline (ZOLOFT) tablet 25 mg  25 mg Oral Daily Mansy, Jan A, MD   25 mg at 12/31/19 3007  . sodium chloride flush (NS) 0.9 % injection 10-40 mL  10-40 mL Intracatheter Q12H Wyvonnia Dusky, MD   10 mL at 12/31/19 2115  . sodium chloride flush (NS) 0.9 % injection 10-40 mL  10-40 mL Intracatheter PRN Wyvonnia Dusky, MD      . traZODone (DESYREL) tablet 25 mg  25 mg Oral QHS PRN Mansy, Jan A, MD   25 mg at 12/30/19 2329   Facility-Administered Medications Ordered in Other Encounters  Medication Dose Route Frequency Provider Last Rate Last Admin  . heparin lock flush 100 unit/mL  500 Units Intravenous Once Corcoran, Melissa C, MD      . sodium chloride 0.9 % 1,000 mL with potassium chloride 20 mEq infusion   Intravenous Once Honor Loh E, NP      . sodium chloride flush (NS) 0.9 % injection 10 mL  10 mL Intravenous Once Borders, Kirt Boys, NP       Past Medical History:  Diagnosis Date  . Abdominal pain 06/10/2018  . Abnormal cervical Papanicolaou smear 09/18/2017  . Anxiety   . Aortic atherosclerosis ([place])   . Arthritis    neck and knees  . 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 Blood transfusion without reported diagnosis   . Cervical cancer ([place]) 09/2016   mets lung  . Chronic anal fissure   . Chronic diarrhea   . Dyspnea   . Erosive gastropathy 09/18/2017  . Factor V Leiden  mutation ([place])   . Fecal incontinence   .  Genital warts   . GERD (gastroesophageal reflux disease)   . GI bleed 10/08/2018  . Heart murmur   . Hematochezia   . Hemorrhoids   . 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 Hepatitis, chronic ([place]) 05/05/2017  . History of cancer chemotherapy    completed 06/2017  . History of Clostridium difficile infection    while undergoing chemo.  Negative test 10/2017  . Ileocolic anastomotic leak   . 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 Intestinal infection due to Clostridium difficile 09/18/2017  . Macrocytic anemia with vitamin B12 deficiency   . Multiple gastric ulcers   . Nausea vomiting and diarrhea   . Pancolitis ([place]) 07/27/2018  . Perianal condylomata   . Pneumonia    History of  . Pulmonary nodules   . Rectal bleeding   . Small bowel obstruction ([place]) 08/2017  . Stiff neck    limited right turn  . Vitamin D deficiency    Past Surgical History:  Procedure Laterality Date  . CHOLECYSTECTOMY    . COLON SURGERY  08/2017   resection  . COLONOSCOPY WITH PROPOFOL N/A 12/20/2017   Procedure: COLONOSCOPY WITH PROPOFOL;  Surgeon: [name], MD;  Location: [facility] ENDOSCOPY;  Service: Gastroenterology;  Laterality: N/A;  . COLONOSCOPY WITH PROPOFOL N/A 07/30/2018   Procedure: COLONOSCOPY WITH PROPOFOL;  Surgeon: [name], MD;  Location: [facility] ENDOSCOPY;  Service: Gastroenterology;  Laterality: N/A;  . COLONOSCOPY WITH PROPOFOL N/A 10/10/2018   Procedure: COLONOSCOPY WITH PROPOFOL;  Surgeon: Lucilla Lame, MD;  Location: [facility] ENDOSCOPY;  Service: Endoscopy;  Laterality: N/A;  . DIAGNOSTIC LAPAROSCOPY    . ESOPHAGOGASTRODUODENOSCOPY (EGD) WITH PROPOFOL N/A 12/20/2017   Procedure: ESOPHAGOGASTRODUODENOSCOPY (EGD) WITH PROPOFOL;  Surgeon: [name], MD;  Location: [place];  Service: Gastroenterology;  Laterality: N/A;  . ESOPHAGOGASTRODUODENOSCOPY (EGD) WITH PROPOFOL   07/30/2018   Procedure: ESOPHAGOGASTRODUODENOSCOPY (EGD) WITH PROPOFOL;  Surgeon: [name], MD;  Location: [facility] ENDOSCOPY;  Service: Gastroenterology;;  . Otho Darner SIGMOIDOSCOPY N/A 11/21/2018   Procedure: FLEXIBLE SIGMOIDOSCOPY;  Surgeon: [name], MD;  Location: [facility] ENDOSCOPY;  Service: Gastroenterology;  Laterality: N/A;  . LAPAROTOMY N/A 08/31/2017   Procedure: EXPLORATORY LAPAROTOMY for SBO, ileocolectomy, removal of piece of uterine wall;  Surgeon: [name], MD;  Location: [facility] ORS;  Service: General;  Laterality: N/A;  . LASER ABLATION CONDOLAMATA N/A 02/22/2018   Procedure: LASER ABLATION/REMOVAL OF FHQRFXJOITG AROUND ANUS AND VAGINA;  Surgeon: Michael Boston, MD;  Location: [place];  Service: General;  Laterality: N/A;  . OOPHORECTOMY    . PORTA CATH INSERTION N/A 05/13/2018   Procedure: PORTA CATH INSERTION;  Surgeon: Katha Cabal, MD;  Location: [place] CV LAB;  Service: Cardiovascular;  Laterality: N/A;  . SMALL INTESTINE SURGERY    . TANDEM RING INSERTION     x3  . THORACOTOMY    . TOTAL KNEE ARTHROPLASTY Left 04/24/2018   Procedure: TOTAL KNEE ARTHROPLASTY;  Surgeon: Lovell Sheehan, MD;  Location: [facility] ORS;  Service: Orthopedics;  Laterality: Left;  . TOTAL KNEE ARTHROPLASTY Right 12/24/2019   Procedure: RIGHT TOTAL KNEE ARTHROPLASTY;  Surgeon: Lovell Sheehan, MD;  Location: [facility] ORS;  Service: Orthopedics;  Laterality: Right;   Social History Social History   Tobacco Use  . Smoking status: Former Smoker    Packs/day: 0.25    Years: 10.00    Pack years: 2.50  Types: Cigarettes    Quit date: 10/16/2006    Years since quitting: 13.2  . Smokeless tobacco: Never Used  Substance Use Topics  . Alcohol use: Not Currently    Comment: seldom  . Drug use: Yes    Types: Marijuana    Comment: not very often   Family History Family History  Problem Relation Age of Onset  . Hypertension Mother   . Hypertension Father    . Diabetes Father   . Hyperlipidemia Father   . Alcohol abuse Daughter   . Hypertension Maternal Grandmother   . Diabetes Maternal Grandmother   . Diabetes Paternal Grandmother   . Hypertension Paternal Grandmother   Denies family history of peripheral artery disease, venous disease or renal disease.  Allergies  Allergen Reactions  . Ketamine Anxiety and Other (See Comments)    Syncope episode/confusion    REVIEW OF SYSTEMS (Negative unless checked)  Constitutional: [] Weight loss  [] Fever  [] Chills Cardiac: [] Chest pain   [] Chest pressure   [] Palpitations   [] Shortness of breath when laying flat   [] Shortness of breath at rest   [] Shortness of breath with exertion. Vascular:  [x] Pain in legs with walking   [x] Pain in legs at rest   [x] Pain in legs when laying flat   [] Claudication   [] Pain in feet when walking  [] Pain in feet at rest  [] Pain in feet when laying flat   [x] History of DVT   [] Phlebitis   [] Swelling in legs   [] Varicose veins   [] Non-healing ulcers Pulmonary:   [] Uses home oxygen   [] Productive cough   [] Hemoptysis   [] Wheeze  [] COPD   [] Asthma Neurologic:  [] Dizziness  [] Blackouts   [] Seizures   [] History of stroke   [] History of TIA  [] Aphasia   [] Temporary blindness   [] Dysphagia   [] Weakness or numbness in arms   [] Weakness or numbness in legs Musculoskeletal:  [] Arthritis   [] Joint swelling   [] Joint pain   [] Low back pain Hematologic:  [] Easy bruising  [] Easy bleeding   [x] Hypercoagulable state   [] Anemic  [] Hepatitis Gastrointestinal:  [] Blood in stool   [] Vomiting blood  [] Gastroesophageal reflux/heartburn   [] Difficulty swallowing. Genitourinary:  [] Chronic kidney disease   [] Difficult urination  [] Frequent urination  [] Burning with urination   [] Blood in urine Skin:  [] Rashes   [] Ulcers   [] Wounds Psychological:  [] History of anxiety   []  History of major depression.  Physical Examination  Vitals:   12/31/19 0500 12/31/19 0815 12/31/19 1618 12/31/19 2304  BP:   136/88 (!) 147/95 (!) 141/89  Pulse:  95 (!) 101 (!) 104  Resp:  18 18 16   Temp: 97.6 F (36.4 C) 97.8 F (36.6 C) 99.7 F (37.6 C) 99 F (37.2 C)  TempSrc: Oral Oral Oral Oral  SpO2:  99% 98% 96%  Weight:      Height:       Body mass index is 26.54 kg/m. Gen:  WD/WN, NAD Head: [place]/AT, No temporalis wasting. Prominent temp pulse not noted. Ear/Nose/Throat: Hearing grossly intact, nares w/o erythema or drainage, oropharynx w/o Erythema/Exudate Eyes: Sclera non-icteric, conjunctiva clear Neck: Trachea midline.  No JVD.  Pulmonary:  Good air movement, respirations not labored, equal bilaterally.  Cardiac: RRR, normal S1, S2. Vascular:  Vessel Right Left  Radial Palpable Palpable  Ulnar Palpable Palpable  Brachial Palpable Palpable  Carotid Palpable, without bruit Palpable, without bruit  Aorta Not palpable N/A  Femoral Palpable Palpable  Popliteal Palpable Palpable  PT Palpable Palpable  DP  Palpable Palpable   Right Lower Extremity: Thigh soft.  Calf soft.  Remedies warm distally toes.  Total knee replacement incision with dressing intact clean and dry.  Ecchymosis noted to the extremity.  Gastrointestinal: soft, non-tender/non-distended. No guarding/reflex.  Musculoskeletal: M/S 5/5 throughout.  Extremities without ischemic changes.  No deformity or atrophy.  Neurologic: Sensation grossly intact in extremities.  Symmetrical.  Speech is fluent. Motor exam as listed above. Psychiatric: Judgment intact, Mood & affect appropriate for pt's clinical situation. Dermatologic: No rashes or ulcers noted.  No cellulitis or open wounds. Lymph : No Cervical, Axillary, or Inguinal lymphadenopathy.  CBC Lab Results  Component Value Date   WBC 5.9 12/31/2019   HGB 8.9 (L) 12/31/2019   HCT 27.1 (L) 12/31/2019   MCV 98.9 12/31/2019   PLT 307 12/31/2019   BMET    Component Value Date/Time   NA 137 12/31/2019 0557   K 4.2 12/31/2019 0557   CL 99 12/31/2019 0557   CO2 29 12/31/2019  0557   GLUCOSE 118 (H) 12/31/2019 0557   BUN 5 (L) 12/31/2019 0557   CREATININE 0.74 12/31/2019 0557   CALCIUM 9.0 12/31/2019 0557   GFRNONAA >60 12/31/2019 0557   GFRAA >60 12/31/2019 0557   Estimated Creatinine Clearance: 89.5 mL/min (by C-G formula based on SCr of 0.74 mg/dL).  COAG Lab Results  Component Value Date   INR 1.1 12/30/2019   INR 1.5 (H) 12/29/2019   INR 2.7 (H) 12/28/2019   Radiology DG Chest 2 View  Result Date: 12/11/2019 CLINICAL DATA:  Cough.  Cervical carcinoma EXAM: CHEST - 2 VIEW COMPARISON:  October 18, 2019 chest radiograph and chest CT October 06, 2019 FINDINGS: There is atelectatic change in the left base. There are scattered subcentimeter nodular lesions consistent with metastatic disease. Heart size and pulmonary vascular normal. No adenopathy. Port-A-Cath tip is in the superior vena cava. No pneumothorax. No bone lesions IMPRESSION: Atelectatic change lateral left base with questionable earliest changes of pneumonia. Multiple subcentimeter nodular lesions consistent with metastatic foci. No adenopathy. Heart size normal. Port-A-Cath tip in superior vena cava. Electronically Signed   By: Lowella Grip III M.D.   On: 12/11/2019 10:41   DG Knee 1-2 Views Right  Result Date: 12/24/2019 CLINICAL DATA:  Right total knee replacement EXAM: RIGHT KNEE - 1-2 VIEW COMPARISON:  None. FINDINGS: Changes of right total knee replacement. No hardware or bony complicating feature. Soft tissue and joint space gas noted. IMPRESSION: Right knee replacement.  No visible complicating feature. Electronically Signed   By: Rolm Baptise M.D.   On: 12/24/2019 10:09   DG Tibia/Fibula Right  Result Date: 12/28/2019 CLINICAL DATA:  Postop pain, edema, and ecchymosis. Surgery on Wednesday, 4 days ago. EXAM: RIGHT TIBIA AND FIBULA - 2 VIEW COMPARISON:  None. FINDINGS: Right knee arthroplasty in expected alignment. No periprosthetic lucency. Remainder of the tibia is intact. Fibula is  normal. No periosteal reaction or bony destruction. Generalized soft tissue edema. Anterior skin staples about the knee. Calcifications in the soft tissues anteromedially are likely phleboliths. IMPRESSION: Generalized soft tissue edema of the lower leg. No acute osseous abnormality. Electronically Signed   By: Keith Rake M.D.   On: 12/28/2019 19:48   CT ABDOMEN PELVIS W CONTRAST  Result Date: 12/29/2019 CLINICAL DATA:  Recent right knee replacement, concern for extending DVT EXAM: CT ABDOMEN AND PELVIS WITH CONTRAST TECHNIQUE: Multidetector CT imaging of the abdomen and pelvis was performed using the standard protocol following bolus administration of intravenous contrast.  CONTRAST:  145m OMNIPAQUE IOHEXOL 300 MG/ML  SOLN COMPARISON:  CT abdomen pelvis 09/22/2019 FINDINGS: Lower chest: There is an increasing size and number of the multiple centrally cavitating nodules in both lower lobes largest in the right lower lobe measuring up to 9.4 mm, previously 4 mm (2/30). No consolidation or effusion. Normal heart size. No pericardial effusion. Hepatobiliary: No focal liver abnormality is seen. Patient is post cholecystectomy. Slight prominence of the biliary tree likely related to reservoir effect. No calcified intraductal gallstones. Pancreas: Unremarkable. No pancreatic ductal dilatation or surrounding inflammatory changes. Spleen: Normal in size without focal abnormality. Adrenals/Urinary Tract: Normal adrenal glands. Moderate left hydroureteronephrosis to the level of soft tissue thickening in the distal left ureter (2/76) which is unclear if this arises de novo or is related to the adjacent soft tissue thickening and the level of the cervix in this patient with known cervical cancer. There is an associated delayed left nephrogram and delayed excretion on excretory phase imaging. No right urinary tract dilatation. No suspicious renal lesions or urolithiasis. Urinary bladder is otherwise unremarkable.  Stomach/Bowel: Distal esophagus, stomach and duodenum are unremarkable. No small bowel dilatation or wall thickening. There are postsurgical changes from prior right hemicolectomy with patent anastomosis. Much of the proximal colon is fluid-filled with minimal formed stool distally. There is distal circumferential mural thickening of the sigmoid and rectum. Vascular/Lymphatic: Atherosclerotic plaque within the normal caliber aorta. No venous filling defects are visualized in the iliac veins or IVC. No pathologic enlarged nodes seen in the abdomen or pelvis. Reproductive: Anteverted uterus. There is some irregular thickening at the level of the cervix which extends left anterolaterally with loss of discernible fat plane with the adjacent dilated left ureter (2/75). No concerning adnexal lesions. Other: There is small amount of presacral stranding and thickening, similar to the comparison studies. Postsurgical changes of the anterior abdominal wall are noted. Mild body wall edema. Musculoskeletal: Levocurvature of the lumbar spine with an apex at L4 is similar to prior. Diffuse sclerotic changes are noted at the SI joints. Appearance is similar to prior. Chronic insufficiency fractures are noted bilaterally. There is asymmetric sclerosis of the left pubic body stable to comparison. IMPRESSION: 1. Moderate left hydroureteronephrosis to the level of soft tissue thickening in the distal left ureter which is possibly the result of local recurrence at the level of the cervix given loss of clearly discernible fat plane from the adjacent cervical soft tissues versus a de novo process at the ureterovesicular junction. 2. Interval increase in size and number of the multiple centrally cavitating nodules in both lower lobes largest in the right lower lobe measuring up to 9.4 mm, previously 4 mm. 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 3. Circumferential mural thickening of the sigmoid and rectum may  represent infectious or inflammatory colitis. 4. Stable appearance of the presacral stranding and thickening, correlate for prior treatment related changes. 5. Sclerotic appearance of the left pubic body, questionable lucency may reflect a pathologic fracture of the appear similar to prior. 6. Bilateral sacral insufficiency fractures and sclerosis of the sacral ala. 7. Aortic Atherosclerosis (ICD10-I70.0). These results were called by telephone at the time of interpretation on 12/29/2019 at 12:03 am to provider Dr MJoan Mayans who verbally acknowledged these results. Electronically Signed   By: PLovena LeM.D.   On: 12/29/2019 00:03   CT FEMUR RIGHT W CONTRAST  Result Date: 12/30/2019 CLINICAL DATA:  Right lower extremity pain and swelling. Right knee arthroplasty on 12/24/2019 EXAM: CT OF  THE LOWER RIGHT EXTREMITY WITH CONTRAST TECHNIQUE: Multidetector CT imaging of the lower right extremity was performed according to the standard protocol following intravenous contrast administration. COMPARISON:  X-ray 12/28/2019, CT 12/28/2019, 09/22/2019 CONTRAST:  140m OMNIPAQUE IOHEXOL 300 MG/ML  SOLN FINDINGS: Bones/Joint/Cartilage Postsurgical changes from right total knee arthroplasty. Metallic streak artifact from hardware slightly degrades evaluation of the adjacent structures. No periprosthetic lucency or fracture is identified. There is a very large suprapatellar knee joint effusion with internal hyperdense contents suggesting hemarthrosis (series 6, image 273; series 10, image 94). Approximate measurements are 10 x 9 x 5 cm (volume = 200 cm^3). The visualized portion of the parasymphyseal left pubic bone again demonstrates patchy sclerosis with possible nondisplaced fracture line (series 5, image 73; series 7, image 86). Pubic symphysis intact without diastasis. Right hip joint is intact without fracture or dislocation. Joint spaces maintained without significant arthropathy. Images of the right ankle and hindfoot  demonstrate no acute fracture or malalignment. Joint spaces are preserved. Ligaments Suboptimally assessed by CT. Muscles and Tendons Preserved muscle bulk without atrophy or fatty infiltration. No intramuscular fluid collection. No tendinous abnormality identified within the limitations of CT. Soft tissues Small amount of fluid within the subcutaneous soft tissues overlying the anterior aspect of the thigh and lower leg, likely postoperative. No well-defined or drainable fluid collection within the subcutaneous soft tissues. No deep fascial fluid. No fascial thickening or enhancement. No inguinal lymphadenopathy. Vascular structures appear intact. IMPRESSION: 1. Very large suprapatellar knee joint effusion with internal hyperdense contents suggesting hemarthrosis (approximate volume 200 mL). 2. 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 3. Patchy sclerosis of the visualized portion of the parasymphyseal left pubic bone with possible nondisplaced fracture line, similar in appearance to the prior study 09/22/2019. Electronically Signed   By: NDavina PokeD.O.   On: 12/30/2019 15:33   CT TIBIA FIBULA RIGHT W CONTRAST  Result Date: 12/30/2019 CLINICAL DATA:  Right lower extremity pain and swelling. Right knee arthroplasty on 12/24/2019 EXAM: CT OF THE LOWER RIGHT EXTREMITY WITH CONTRAST TECHNIQUE: Multidetector CT imaging of the lower right extremity was performed according to the standard protocol following intravenous contrast administration. COMPARISON:  X-ray 12/28/2019, CT 12/28/2019, 09/22/2019 CONTRAST:  1075mOMNIPAQUE IOHEXOL 300 MG/ML  SOLN FINDINGS: Bones/Joint/Cartilage Postsurgical changes from right total knee arthroplasty. Metallic streak artifact from hardware slightly degrades evaluation of the adjacent structures. No periprosthetic lucency or fracture is identified. There is a very large suprapatellar knee joint effusion with internal hyperdense contents suggesting  hemarthrosis (series 6, image 273; series 10, image 94). Approximate measurements are 10 x 9 x 5 cm (volume = 200 cm^3). The visualized portion of the parasymphyseal left pubic bone again demonstrates patchy sclerosis with possible nondisplaced fracture line (series 5, image 73; series 7, image 86). Pubic symphysis intact without diastasis. Right hip joint is intact without fracture or dislocation. Joint spaces maintained without significant arthropathy. Images of the right ankle and hindfoot demonstrate no acute fracture or malalignment. Joint spaces are preserved. Ligaments Suboptimally assessed by CT. Muscles and Tendons Preserved muscle bulk without atrophy or fatty infiltration. No intramuscular fluid collection. No tendinous abnormality identified within the limitations of CT. Soft tissues Small amount of fluid within the subcutaneous soft tissues overlying the anterior aspect of the thigh and lower leg, likely postoperative. No well-defined or drainable fluid collection within the subcutaneous soft tissues. No deep fascial fluid. No fascial thickening or enhancement. No inguinal lymphadenopathy. Vascular structures appear intact. IMPRESSION: 1. Very large suprapatellar knee joint  effusion with internal hyperdense contents suggesting hemarthrosis (approximate volume 200 mL). 2. 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 3. Patchy sclerosis of the visualized portion of the parasymphyseal left pubic bone with possible nondisplaced fracture line, similar in appearance to the prior study 09/22/2019. Electronically Signed   By: Davina Poke D.O.   On: 12/30/2019 15:33   US Venous Img Lower Unilateral Right  Result Date: 12/28/2019 CLINICAL DATA:  54 year old female with recent right knee replacement presenting with right lower extremity edema and ecchymosis. EXAM: Right LOWER EXTREMITY VENOUS DOPPLER ULTRASOUND TECHNIQUE: Gray-scale sonography with compression, as well as color and  duplex ultrasound, were performed to evaluate the deep venous system(s) from the level of the common femoral vein through the popliteal and proximal calf veins. COMPARISON:  Ultrasound dated 12/27/2019. FINDINGS: VENOUS Normal compressibility of the common femoral, superficial femoral, and popliteal veins, as well as the visualized calf veins. Visualized portions of profunda femoral vein and great saphenous vein unremarkable. No filling defects to suggest DVT on grayscale or color Doppler imaging. Doppler waveforms show normal direction of venous flow, normal respiratory phasicity and response to augmentation. Limited views of the contralateral common femoral vein are unremarkable. OTHER None. Limitations: none IMPRESSION: No femoropopliteal DVT nor evidence of DVT within the visualized calf veins. If clinical symptoms are inconsistent or if there are persistent or worsening symptoms, further imaging (possibly involving the iliac veins) may be warranted. Electronically Signed   By: Anner Crete M.D.   On: 12/28/2019 22:44   US Venous Img Lower Unilateral Right  Result Date: 12/27/2019 CLINICAL DATA:  54 year old with right leg swelling for 2 days. Recent knee replacement. EXAM: RIGHT LOWER EXTREMITY VENOUS DOPPLER ULTRASOUND TECHNIQUE: Gray-scale sonography with graded compression, as well as color Doppler and duplex ultrasound were performed to evaluate the lower extremity deep venous systems from the level of the common femoral vein and including the common femoral, femoral, profunda femoral, popliteal and calf veins including the posterior tibial, peroneal and gastrocnemius veins when visible. The superficial great saphenous vein was also interrogated. Spectral Doppler was utilized to evaluate flow at rest and with distal augmentation maneuvers in the common femoral, femoral and popliteal veins. COMPARISON:  None. FINDINGS: Contralateral Common Femoral Vein: Respiratory phasicity is normal and symmetric  with the symptomatic side. No evidence of thrombus. Normal compressibility. Common Femoral Vein: No evidence of thrombus. Normal compressibility, respiratory phasicity and response to augmentation. Saphenofemoral Junction: No evidence of thrombus. Normal compressibility and flow on color Doppler imaging. Profunda Femoral Vein: No evidence of thrombus. Normal compressibility and flow on color Doppler imaging. Femoral Vein: No evidence of thrombus. Normal compressibility, respiratory phasicity and response to augmentation. Popliteal Vein: No evidence of thrombus. Normal compressibility, respiratory phasicity and response to augmentation. Calf Veins: Due to leg edema, the calf veins cannot be confidently identified or evaluated on this examination. Limited Doppler flow in some of the tibial veins. Other Findings:  None. IMPRESSION: No evidence for deep venous thrombosis in the right leg above the knee. Unfortunately, the deep calf veins cannot be evaluated and cannot exclude DVT in the deep calf veins. Electronically Signed   By: Markus Daft M.D.   On: 12/27/2019 11:07   DG Knee Complete 4 Views Right  Result Date: 12/28/2019 CLINICAL DATA:  Postop pain, edema, and ecchymosis. Surgery on Wednesday, 4 days ago. EXAM: RIGHT KNEE - COMPLETE 4+ VIEW COMPARISON:  Knee radiograph 12/24/2019 FINDINGS: Right knee arthroplasty in unchanged alignment. No periprosthetic lucency or fracture. There  is been patellar resurfacing. Large joint effusion, increased joint effusion since immediate postoperative exam. Previous intra-articular air has resolved. There are anterior skin staples in place with generalized soft tissue edema. IMPRESSION: Recent right knee arthroplasty in unchanged alignment. Large joint effusion, increased joint effusion since immediate postoperative exam. Generalized soft tissue edema, nonspecific. Electronically Signed   By: Keith Rake M.D.   On: 12/28/2019 19:47   [place] OR NERVE BLOCK-IMAGE ONLY  [facility])  Result Date: 12/24/2019 There is no interpretation for this exam.  This order is for images obtained during a surgical procedure.  Please See "Surgeries" Tab for more information regarding the procedure.   Assessment/Plan The patient is a 54 year old female with a known history of metastatic cervical cancer, history of DVT/PE, known factor V Leiden who is status post a right total knee replacement on December 24, 2019.  The patient's return back to the emergency department on December 27, 2019 December 28, 2019 complaining of right lower extremity pain, swelling and paresthesia.  No DVT was found venous duplex and CT.  Notable for hematoma.   1.  History of DVT /factor V Leiden:  Patient is on chronic oral anticoagulation (Xarelto) however after being seen by oncology it was recommended that this be stopped for at least 2 weeks.  At this time, the patient is at high risk for DVT due to her recent postoperative status, history of DVT/PE, metastatic cancer and factor V Leyden status.  Vascular surgery was consulted by her primary team (hospitalist) for placement of an IVC filter.  We will place an IVC filter as the patient is at high risk for DVT/PE due to the above factors.  Procedure, risks and benefits were explained to the patient.  All questions were answered.  The patient wishes to proceed.  2.  Recent right total knee replacement: This places the patient at a higher postoperative risk for forming DVT. The patient's oral anticoagulation is currently on hold Will place IVC filter  3.  Metastatic cervical cancer: This places the patient at a higher postoperative risk for forming DVT. The patient's oral anticoagulation is currently on hold Will place IVC filter  Discussed with Dr. Mayme Genta, PA-C  01/01/2020 11:45 AM  This note was created with Dragon medical transcription system.  Any error is purely unintentional

## 2020-01-01 NOTE — Op Note (Signed)
[name]   OPERATIVE NOTE    PRE-OPERATIVE DIAGNOSIS: previous DVT, hematoma after knee replacement, stage IV cervical cancer  POST-OPERATIVE DIAGNOSIS: same as above  PROCEDURE: 1.   Ultrasound guidance for vascular access to the right femoral vein 2.   Catheter placement into the inferior vena cava 3.   Inferior venacavogram 4.   Placement of a Bard [place]ali IVC filter  SURGEON: Leotis Pain, MD  ASSISTANT(S): None  ANESTHESIA: local with Moderate Conscious Sedation for approximately 15 minutes using 2 mg of Versed and 50 mcg of Fentanyl  ESTIMATED BLOOD LOSS: minimal  CONTRAST: 15 cc  FLUORO TIME: less than one minute  FINDING(S): 1.  Patent IVC  SPECIMEN(S):  none  INDICATIONS:   Joanna Hall is a 54 y.o. female who presents with a hematoma following her knee replacement with a history of DVT.  She has stage IV cervical cancer and is at high risk for thromboembolic complications, so an IVC filter was requested as she will be off anticoagulation for the next 2 weeks.  Inferior vena cava filter is indicated for this reason.  Risks and benefits including filter thrombosis, migration, fracture, bleeding, and infection were all discussed.  We discussed that all IVC filters that we place can be removed if desired from the patient once the need for the filter has passed.    DESCRIPTION: After obtaining full informed written consent, the patient was brought back to the vascular suite. The skin was sterilely prepped and draped in a sterile surgical field was created. Moderate conscious sedation was administered during a face to face encounter with the patient throughout the procedure with my supervision of the RN administering medicines and monitoring the patient's vital signs, pulse oximetry, telemetry and mental status throughout from the start of the procedure until the patient was taken to the recovery room. The right femoral vein was accessed under  direct ultrasound guidance without difficulty with a Seldinger needle and a J-wire was then placed. After skin nick and dilatation, the delivery sheath was placed into the inferior vena cava and an inferior venacavogram was performed. This demonstrated a patent IVC with the level of the renal veins at L2.  The filter was then deployed into the inferior vena cava at the level of the L2-L3 interspace just below the renal veins. The delivery sheath was then removed. Pressure was held. Sterile dressings were placed. The patient tolerated the procedure well and was taken to the recovery room in stable condition.  COMPLICATIONS: None  CONDITION: Stable  Leotis Pain  01/01/2020, 1:09 PM   This note was created with Dragon Medical transcription system. Any errors in dictation are purely unintentional.

## 2020-01-01 NOTE — Progress Notes (Signed)
Joanna Hall   DOB:03-10-1966   PH#:[id number]    Subjective: Patient continues to have significant pain especially with movement of the right lower extremity.  Patient using Ace wrap/ice packs.  Continue significant narcotic pain medications.  Patient has not been started on anticoagulation yet.   Objective:  Vitals:   01/01/20 1349 01/01/20 1647  BP:  (!) 124/95  Pulse: 94 (!) 102  Resp: 20 20  Temp:  98.2 F (36.8 C)  SpO2: 95% 100%     Intake/Output Summary (Last 24 hours) at 01/01/2020 2033 Last data filed at 01/01/2020 1855 Gross per 24 hour  Intake 240 ml  Output --  Net 240 ml    Physical Exam  Constitutional: She is oriented to person, place, and time and well-developed, well-nourished, and in no distress.  HENT:  Head: Normocephalic and atraumatic.  Mouth/Throat: Oropharynx is clear and moist. No oropharyngeal exudate.  Eyes: Pupils are equal, round, and reactive to light.  Cardiovascular: Normal rate and regular rhythm.  Pulmonary/Chest: Effort normal and breath sounds normal. No respiratory distress. She has no wheezes.  Abdominal: Soft. Bowel sounds are normal. She exhibits no distension and no mass. There is no abdominal tenderness. There is no rebound and no guarding.  Musculoskeletal:        General: No tenderness or edema. Normal range of motion.     Cervical back: Normal range of motion and neck supple.     Comments: Swelling ecchymosis of the right lower extremity.  Tender; pulses felt.  Neurological: She is alert and oriented to person, place, and time.  Skin: Skin is warm.  Ecchymotic; right lower extremity.  Psychiatric: Affect normal.     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  #Postoperative hematoma -status post right TKA-currently off anticoagulation.-I would recommend holding off starting anticoagulation given the risk of bleeding.  Recommend IVC filter placement.  Discussed with Dr.Amery.   #History of DVT PE/factor V Leiden-on indefinite anticoagulation; currently on hold-see above.  Plan IVC.   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  Monitor closely.  #Pain control-patient has chronic baseline pain because of arthritis/chronic abdominal pain.  Given the worsening pain in the right lower extremity-recommend adjustment of pain medications.  Discussed with [name] Borders-titrate p.o. narcotic medications.  Appreciate input  Addendum: Patient s/p IVC filter.  We will plan to explant when patient is able to go on anticoagulation.    Cammie Sickle, MD 01/01/2020  8:33 PM

## 2020-01-01 NOTE — Progress Notes (Addendum)
Patient called the clinic today saying she was in intractable pain.  Case discussed with Drs. Rogue Bussing and [place].  I was asked to adjust patient's oral pain regimen in anticipation of her discharging home tomorrow.  Spoke with patient via phone.  She currently rates her pain in the R. leg as 10 out of 10.  She has had severe pain in the leg secondary to postoperative hematoma following knee replacement on 12/24/19.    In past 24 hours, she has received 60 mg oxycodone IR and 60 mg scheduled OxyContin.  She has also received 1 mg IV hydromorphone.  Total oral MME is approximately 200 mg.  Plan: -Increase OxyContin to 30 mg every 8 hours (using two 54m tablets) -Increase oxycodone to 15 mg every 3 hours as needed for BTP -Prophylactic bowel regimen  Time Total: 20 minutes (no charge note)  Visit consisted of counseling and education dealing with the complex and emotionally intense issues of symptom management and palliative care in the setting of serious and potentially life-threatening illness.Greater than 50%  of this time was spent counseling and coordinating care related to the above assessment and plan.  Signed by: JAltha Harm PhD, NP-C

## 2020-01-01 NOTE — Progress Notes (Signed)
Physical Therapy Treatment Patient Details Name: EPSIE WALTHALL MRN: [id number] DOB: Jul 26, 1966 Today's Date: 01/01/2020    History of Present Illness Laine Fonner is a 54 yo female s/p RTKA, WBAT. 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    PT Comments    Pt stating she is having procedure this AM - IVC filter - but wants to get up and walk.  Transitions in and out of bed with supervision and is able to walk 28' with RW and supervision - limited by pain.  During gait, pt voiced overall frustration with medical status.  She stated she was considering stopping treatments as she does not feel like she is getting any better.  Relayed to Dr. Kurtis Bushman.   Follow Up Recommendations  Home health PT;Supervision for mobility/OOB     Equipment Recommendations  None recommended by PT    Recommendations for Other Services       Precautions / Restrictions Precautions Precautions: Fall;Knee Precaution Booklet Issued: No Restrictions Weight Bearing Restrictions: Yes RLE Weight Bearing: Weight bearing as tolerated    Mobility  Bed Mobility Overal bed mobility: Independent Bed Mobility: Supine to Sit;Sit to Supine     Supine to sit: Modified independent (Device/Increase time) Sit to supine: Modified independent (Device/Increase time)   General bed mobility comments: uses leg hook technique, needs no cues.  Transfers Overall transfer level: Modified independent Equipment used: Rolling walker (2 wheeled) Transfers: Sit to/from Stand Sit to Stand: Supervision         General transfer comment: continues with transitions without RW to/from bed despite cues  Ambulation/Gait Ambulation/Gait assistance: Supervision Gait Distance (Feet): 70 Feet Assistive device: Rolling walker (2 wheeled) Gait Pattern/deviations: Step-to pattern Gait velocity: decreased   General Gait  Details: slightly impulsive, 3-point gait, antalgic loading on RLE, avoids TKE in stance. pain increases about 35' into gait and pt turned to return to room.   Stairs             Wheelchair Mobility    Modified Rankin (Stroke Patients Only)       Balance Overall balance assessment: Needs assistance Sitting-balance support: Feet supported Sitting balance-Leahy Scale: Normal     Standing balance support: Bilateral upper extremity supported;During functional activity Standing balance-Leahy Scale: Good Standing balance comment: at times will take a few steps without RW. Good balance                            Cognition Arousal/Alertness: Awake/alert Behavior During Therapy: WFL for tasks assessed/performed Overall Cognitive Status: Within Functional Limits for tasks assessed                                        Exercises      General Comments        Pertinent Vitals/Pain Pain Assessment: Faces Faces Pain Scale: Hurts whole lot Pain Location: behind knee  softly crying during session but wanting to walk Pain Descriptors / Indicators: Sore Pain Intervention(s): Limited activity within patient's tolerance;Monitored during session;Repositioned;Premedicated before session    Home Living                      Prior Function            PT Goals (current goals can  now be found in the care plan section) Progress towards PT goals: Progressing toward goals    Frequency    7X/week      PT Plan Current plan remains appropriate    Co-evaluation              AM-PAC PT "6 Clicks" Mobility   Outcome Measure  Help needed turning from your back to your side while in a flat bed without using bedrails?: None Help needed moving from lying on your back to sitting on the side of a flat bed without using bedrails?: None Help needed moving to and from a bed to a chair (including a wheelchair)?: None Help needed standing up from  a chair using your arms (e.g., wheelchair or bedside chair)?: None Help needed to walk in hospital room?: A Little Help needed climbing 3-5 steps with a railing? : A Little 6 Click Score: 22    End of Session Equipment Utilized During Treatment: Gait belt Activity Tolerance: Patient limited by pain Patient left: in bed;with call bell/phone within reach;with bed alarm set;Other (comment) Nurse Communication: Mobility status Pain - Right/Left: Right Pain - part of body: Knee     Time: 7106-2694 PT Time Calculation (min) (ACUTE ONLY): 16 min  Charges:  $Gait Training: 8-22 mins                    Chesley Noon, PTA 01/01/20, 10:00 AM

## 2020-01-01 NOTE — Progress Notes (Signed)
Gave patient CHG wipes.  Patient wanted to wait and do them herself in a little while.  She said she wants to get woke up good first.

## 2020-01-01 NOTE — Progress Notes (Signed)
Subjective:  Patient reports pain as moderate to severe.  Swelling and bruising getting better. Patient participating with PT.  Objective:   VITALS:   Vitals:   12/31/19 0500 12/31/19 0815 12/31/19 1618 12/31/19 2304  BP:  136/88 (!) 147/95 (!) 141/89  Pulse:  95 (!) 101 (!) 104  Resp:  18 18 16   Temp: 97.6 F (36.4 C) 97.8 F (36.6 C) 99.7 F (37.6 C) 99 F (37.2 C)  TempSrc: Oral Oral Oral Oral  SpO2:  99% 98% 96%  Weight:      Height:        PHYSICAL EXAM:  Neurologically intact ABD soft Neurovascular intact Sensation intact distally Intact pulses distally Dorsiflexion/Plantar flexion intact Incision: dressing C/D/I No cellulitis present Compartment soft  LABS  No results found. However, due to the size of the patient record, not all encounters were searched. Please check Results Review for a complete set of results.  CT FEMUR RIGHT W CONTRAST  Result Date: 12/30/2019 CLINICAL DATA:  Right lower extremity pain and swelling. Right knee arthroplasty on 12/24/2019 EXAM: CT OF THE LOWER RIGHT EXTREMITY WITH CONTRAST TECHNIQUE: Multidetector CT imaging of the lower right extremity was performed according to the standard protocol following intravenous contrast administration. COMPARISON:  X-ray 12/28/2019, CT 12/28/2019, 09/22/2019 CONTRAST:  114m OMNIPAQUE IOHEXOL 300 MG/ML  SOLN FINDINGS: Bones/Joint/Cartilage Postsurgical changes from right total knee arthroplasty. Metallic streak artifact from hardware slightly degrades evaluation of the adjacent structures. No periprosthetic lucency or fracture is identified. There is a very large suprapatellar knee joint effusion with internal hyperdense contents suggesting hemarthrosis (series 6, image 273; series 10, image 94). Approximate measurements are 10 x 9 x 5 cm (volume = 200 cm^3). The visualized portion of the parasymphyseal left pubic bone again demonstrates patchy sclerosis with possible nondisplaced fracture line (series  5, image 73; series 7, image 86). Pubic symphysis intact without diastasis. Right hip joint is intact without fracture or dislocation. Joint spaces maintained without significant arthropathy. Images of the right ankle and hindfoot demonstrate no acute fracture or malalignment. Joint spaces are preserved. Ligaments Suboptimally assessed by CT. Muscles and Tendons Preserved muscle bulk without atrophy or fatty infiltration. No intramuscular fluid collection. No tendinous abnormality identified within the limitations of CT. Soft tissues Small amount of fluid within the subcutaneous soft tissues overlying the anterior aspect of the thigh and lower leg, likely postoperative. No well-defined or drainable fluid collection within the subcutaneous soft tissues. No deep fascial fluid. No fascial thickening or enhancement. No inguinal lymphadenopathy. Vascular structures appear intact. IMPRESSION: 1. Very large suprapatellar knee joint effusion with internal hyperdense contents suggesting hemarthrosis (approximate volume 200 mL). 2. 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 3. Patchy sclerosis of the visualized portion of the parasymphyseal left pubic bone with possible nondisplaced fracture line, similar in appearance to the prior study 09/22/2019. Electronically Signed   By: NDavina PokeD.O.   On: 12/30/2019 15:33   CT TIBIA FIBULA RIGHT W CONTRAST  Result Date: 12/30/2019 CLINICAL DATA:  Right lower extremity pain and swelling. Right knee arthroplasty on 12/24/2019 EXAM: CT OF THE LOWER RIGHT EXTREMITY WITH CONTRAST TECHNIQUE: Multidetector CT imaging of the lower right extremity was performed according to the standard protocol following intravenous contrast administration. COMPARISON:  X-ray 12/28/2019, CT 12/28/2019, 09/22/2019 CONTRAST:  1015mOMNIPAQUE IOHEXOL 300 MG/ML  SOLN FINDINGS: Bones/Joint/Cartilage Postsurgical changes from right total knee arthroplasty. Metallic streak artifact  from hardware slightly degrades evaluation of the adjacent structures. No periprosthetic lucency or fracture  is identified. There is a very large suprapatellar knee joint effusion with internal hyperdense contents suggesting hemarthrosis (series 6, image 273; series 10, image 94). Approximate measurements are 10 x 9 x 5 cm (volume = 200 cm^3). The visualized portion of the parasymphyseal left pubic bone again demonstrates patchy sclerosis with possible nondisplaced fracture line (series 5, image 73; series 7, image 86). Pubic symphysis intact without diastasis. Right hip joint is intact without fracture or dislocation. Joint spaces maintained without significant arthropathy. Images of the right ankle and hindfoot demonstrate no acute fracture or malalignment. Joint spaces are preserved. Ligaments Suboptimally assessed by CT. Muscles and Tendons Preserved muscle bulk without atrophy or fatty infiltration. No intramuscular fluid collection. No tendinous abnormality identified within the limitations of CT. Soft tissues Small amount of fluid within the subcutaneous soft tissues overlying the anterior aspect of the thigh and lower leg, likely postoperative. No well-defined or drainable fluid collection within the subcutaneous soft tissues. No deep fascial fluid. No fascial thickening or enhancement. No inguinal lymphadenopathy. Vascular structures appear intact. IMPRESSION: 1. Very large suprapatellar knee joint effusion with internal hyperdense contents suggesting hemarthrosis (approximate volume 200 mL). 2. 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 3. Patchy sclerosis of the visualized portion of the parasymphyseal left pubic bone with possible nondisplaced fracture line, similar in appearance to the prior study 09/22/2019. Electronically Signed   By: Davina Poke D.O.   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 Morphine prn for pain. [place] of RLE was neg for DVT.  Patient on pain medicine contract.  Was taking Oxycontin and oxycodone chronically. Xarelto on hold on hold per oncology. Pt can be off for 2 weeks until f/u with oncology as outpt to decide then. Discharge per medicine F/U up with Dr. [name] office check week.  Should already have appointment.  Call to confirm. [place]   Joanna Hall , PA-C 01/01/2020, 7:00 AM

## 2020-01-02 ENCOUNTER — Encounter: Payer: Self-pay | Admitting: Internal Medicine

## 2020-01-02 DIAGNOSIS — G893 Neoplasm related pain (acute) (chronic): Secondary | ICD-10-CM

## 2020-01-02 DIAGNOSIS — R1030 Lower abdominal pain, unspecified: Secondary | ICD-10-CM

## 2020-01-02 DIAGNOSIS — Z515 Encounter for palliative care: Secondary | ICD-10-CM

## 2020-01-02 MED ORDER — OXYCODONE HCL 15 MG PO TABS: 15.0000 mg | ORAL_TABLET | ORAL | 0 refills | Status: DC | PRN

## 2020-01-02 MED ORDER — OXYCODONE HCL ER 30 MG PO T12A: 30.0000 mg | EXTENDED_RELEASE_TABLET | Freq: Three times a day (TID) | ORAL | 0 refills | Status: DC

## 2020-01-02 MED ORDER — HYDROMORPHONE HCL 1 MG/ML IJ SOLN
0.5000 mg | Freq: Once | INTRAMUSCULAR | Status: AC
  Administered 2020-01-02: 0.5 mg via INTRAVENOUS
  Filled 2020-01-02: qty 1

## 2020-01-02 NOTE — Progress Notes (Signed)
Pt is being discharged home.  Discharged papers given and explained to pt.  Pt verbalized understanding.  Meds and f/u appointments reviewed.  Rx sent electronically to pharmacy.  Pt made aware. Awaiting transportation.

## 2020-01-02 NOTE — Progress Notes (Signed)
  Subjective:  Patient reports pain as moderate to severe.  Doing better  Objective:   VITALS:   Vitals:   01/01/20 1349 01/01/20 1647 01/02/20 0034 01/02/20 0827  BP:  (!) 124/95 (!) 144/93 (!) 154/89  Pulse: 94 (!) 102 90 92  Resp: 20 20 17 18   Temp:  98.2 F (36.8 C) 98.4 F (36.9 C) 98.4 F (36.9 C)  TempSrc:  Oral Oral Oral  SpO2: 95% 100% 99% 97%  Weight:      Height:        PHYSICAL EXAM:  Neurologically intact ABD soft Neurovascular intact Sensation intact distally Intact pulses distally Dorsiflexion/Plantar flexion intact Incision: dressing C/D/I No cellulitis present Compartment soft  LABS  No results found. However, due to the size of the patient record, not all encounters were searched. Please check Results Review for a complete set of results.  PERIPHERAL VASCULAR CATHETERIZATION  Result Date: 01/01/2020 See op note   Assessment/Plan: 1 Day Post-Op   Active Problems:   Malignant neoplasm of overlapping sites of cervix (HCC)   Right leg pain   Hydroureter   Transaminitis   Advance diet Advance diet Up with therapy  WBAT left lower extremity Right knee and leg painlikely from hemarthrosis from anticoagulation and recent surgery. 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 Morphine prn for pain. [place] of RLE was neg for DVT.  Patient on pain medicine contract.  Was taking Oxycontin and oxycodone chronically. Meds increased. Xarelto on hold on hold per oncology. IVC placed   F/u with oncology as outpt to decide then. Discharge per medicine, okay from ortho standpoint. Encourage movement and elevation.  Nothing under knee. F/U up with Dr. [name] office check week.  Should already have appointment.  Call to confirm. [place]  Carlynn Spry , PA-C 01/02/2020, 8:37 AM

## 2020-01-02 NOTE — Discharge Summary (Signed)
Joanna Hall LTJ:[id number] DOB: 1965-10-24 DOA: 12/28/2019  PCP: Towanda Malkin, MD  Admit date: 12/28/2019 Discharge date: 01/02/2020  Admitted From: home Disposition:  home  Recommendations for Outpatient Follow-up:  1. Follow up with PCP in 1 week 2. Please obtain BMP/CBC in one week 3. Oncology as scheduled 4. orthopedics  Home Health:PT   Discharge Condition:Stable CODE STATUS:DNR Diet recommendation: Heart Healthy Brief/Interim Summary: Joanna Hall  is a 54 y.o. 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 [name] and did fairly well to be discharged next day.  She continued to have right knee swelling that has been a gradual extending to her right leg over the last 3 days.  She was seen in the emergency room yesterday and had venous Doppler that showed no evidence for DVT above the knee but unfortunately the deep calf veins could not be evaluated.  She was noted to have increased edema and ecchymosis and from orthopedic standpoint they were considered expected postoperative changes.  She was discharged home to continue her Xarelto that was temporarily held perioperatively and continued upon her discharge.  She presented with worsening right knee and leg pain and swelling with inability to move it without pain.  She was found with hemarthrosis from anticoagulation and recent surgery. Ortho had no plans, keep legs elevated. Discussed with onoclogy and orthopedics about about resuming anticoagulation.  After much discussion oncology recommended placing IVC filter.  She was found with moderate size hydronephrosis on CT scan.Likely secondary to stage IV cervical cancer. Urology following and recs apprec .ureteral stent or nephrostomy tube could add  significant unneeded morbidity to her overall situation. If the hydronephrosis worsened, creatinine increased or there was some concern for infection of the upper left urinary tract , can revisit the issue. Urology also recommend periodic renal ultrasound in a few weeks.  She had transaminitis on admission with trending downward.  Her potassium was replaced.  She is stable to be discharged home from oncology and orthopedic standpoint.  Palliative care was consulted the manage her pain medications as she was complaining of pain.  Discharge Diagnoses:  Active Problems:   Malignant neoplasm of overlapping sites of cervix [facility])   Right leg pain   Hydroureter   Transaminitis    Discharge Instructions  Discharge Instructions    Call MD for:  redness, tenderness, or signs of infection (pain, swelling, redness, odor or green/yellow discharge around incision site)   Complete by: As directed    Call MD for:  severe uncontrolled pain   Complete by: As directed    Call MD for:  temperature >100.4   Complete by: As directed    Diet - low sodium heart healthy   Complete by: As directed    Discharge instructions   Complete by: As directed    Keep legs elevated   Increase activity slowly   Complete by: As directed      Allergies as of 01/02/2020      Reactions   Ketamine Anxiety, Other (See Comments)   Syncope episode/confusion      Medication List    STOP taking these medications   enoxaparin 120 MG/0.8ML injection Commonly known as: Lovenox   potassium chloride SA 20 MEQ tablet Commonly known as: KLOR-CON   rivaroxaban 20  MG Tabs tablet Commonly known as: XARELTO     TAKE these medications   amitriptyline 100 MG tablet Commonly known as: ELAVIL Take 1 tablet (100 mg total) by mouth at bedtime.   Calcium 500 +D 500-400 MG-UNIT Tabs Generic drug: Calcium Carb-Cholecalciferol Take 1 tablet by mouth daily.   colchicine 0.6 MG tablet Take 0.6 mg by mouth daily.   COLD RELIEF  PO Take 2 tablets by mouth daily as needed (cold symptoms).   cyanocobalamin 1000 MCG/ML injection Commonly known as: (VITAMIN B-12) Inject 1,000 mcg into the muscle every 21 ( twenty-one) days.   dexamethasone 4 MG tablet Commonly known as: DECADRON Take 4 mg by mouth See admin instructions. Take 4 mg twice daily for 3 days starting 3 days prior to chemo   diphenoxylate-atropine 2.5-0.025 MG tablet Commonly known as: LOMOTIL Take 2 tablets by mouth 2 (two) times daily as needed for diarrhea or loose stools.   docusate sodium 100 MG capsule Commonly known as: COLACE Take 1 capsule (100 mg total) by mouth 2 (two) times daily.   famotidine 20 MG tablet Commonly known as: PEPCID Take 1 tablet (20 mg total) by mouth 2 (two) times daily.   folic acid 1 MG tablet Commonly known as: FOLVITE Take 1 tablet (1 mg total) by mouth daily.   hydrocortisone 10 MG tablet Commonly known as: CORTEF Take 1 tablet (10 mg total) by mouth 2 (two) times daily.   hydrOXYzine 25 MG capsule Commonly known as: VISTARIL Take 1 capsule (25 mg total) by mouth 3 (three) times daily as needed for itching.   hyoscyamine 0.375 MG 12 hr tablet Commonly known as: LEVBID Take 1 tablet (0.375 mg total) by mouth 2 (two) times daily.   methocarbamol 500 MG tablet Commonly known as: ROBAXIN Take 1 tablet (500 mg total) by mouth every 6 (six) hours as needed for muscle spasms.   ondansetron 4 MG disintegrating tablet Commonly known as: Zofran ODT Take 1 tablet (4 mg total) by mouth every 8 (eight) hours as needed for nausea or vomiting.   oxyCODONE 15 MG immediate release tablet Commonly known as: ROXICODONE Take 1 tablet (15 mg total) by mouth every 3 (three) hours as needed (for breakthrough pain). What changed:   medication strength  how much to take  when to take this   oxyCODONE 30 MG 12 hr tablet Take 1 tablet (30 mg total) by mouth every 8 (eight) hours. What changed:   medication  strength  how much to take   pantoprazole 40 MG tablet Commonly known as: PROTONIX Take 1 tablet (40 mg total) by mouth daily.   promethazine 25 MG tablet Commonly known as: PHENERGAN Take 1 tablet (25 mg total) by mouth every 8 (eight) hours as needed for nausea or vomiting.   sertraline 25 MG tablet Commonly known as: ZOLOFT Take 1 tablet (25 mg total) by mouth daily.   Super Thera Vite M Tabs Take 1 tablet by mouth daily.   triamcinolone ointment 0.5 % Commonly known as: KENALOG Apply 1 application topically 2 (two) times daily. What changed:   when to take this  reasons to take this   Ventolin HFA 108 (90 Base) MCG/ACT inhaler Generic drug: albuterol Inhale 1-2 puffs into the lungs every 4 (four) hours as needed for shortness of breath.      Follow-up Information    Festus Aloe, MD. Call on 01/15/2020.   Specialty: Urology Why: Appt w/ PA-C = Sam)   @ 19:45 am;  Contact information: [place] [address] [phone]        Lovell Sheehan, MD On 01/16/2020.   Specialty: Orthopedic Surgery Why: @ 1:00 pm Contact information: [place] [address] [phone]        Cammie Sickle, MD On 01/09/2020.   Specialties: Internal Medicine, Oncology Why: (Labs; Doc Appt; Infusion)  @ 12:45 Contact information: [place] [address] [phone]        Algernon Huxley, MD On 02/13/2020.   Specialties: Vascular Surgery, Radiology, Interventional Cardiology Why: IVC Filter. Can see Dew or Arna Medici. Bilateral Venous Duplex with visit. @ 9:00 am Contact information: [address] [place] [address] [phone]          Allergies  Allergen Reactions  . Ketamine Anxiety and Other (See Comments)    Syncope episode/confusion     Consultations: Oncology , palliative care, ortho  Procedures/Studies: DG Chest 2 View  Result Date: 12/11/2019 CLINICAL DATA:  Cough.   Cervical carcinoma EXAM: CHEST - 2 VIEW COMPARISON:  October 18, 2019 chest radiograph and chest CT October 06, 2019 FINDINGS: There is atelectatic change in the left base. There are scattered subcentimeter nodular lesions consistent with metastatic disease. Heart size and pulmonary vascular normal. No adenopathy. Port-A-Cath tip is in the superior vena cava. No pneumothorax. No bone lesions IMPRESSION: Atelectatic change lateral left base with questionable earliest changes of pneumonia. Multiple subcentimeter nodular lesions consistent with metastatic foci. No adenopathy. Heart size normal. Port-A-Cath tip in superior vena cava. Electronically Signed   By: Lowella Grip III M.D.   On: 12/11/2019 10:41   DG Knee 1-2 Views Right  Result Date: 12/24/2019 CLINICAL DATA:  Right total knee replacement EXAM: RIGHT KNEE - 1-2 VIEW COMPARISON:  None. FINDINGS: Changes of right total knee replacement. No hardware or bony complicating feature. Soft tissue and joint space gas noted. IMPRESSION: Right knee replacement.  No visible complicating feature. Electronically Signed   By: Rolm Baptise M.D.   On: 12/24/2019 10:09   DG Tibia/Fibula Right  Result Date: 12/28/2019 CLINICAL DATA:  Postop pain, edema, and ecchymosis. Surgery on Wednesday, 4 days ago. EXAM: RIGHT TIBIA AND FIBULA - 2 VIEW COMPARISON:  None. FINDINGS: Right knee arthroplasty in expected alignment. No periprosthetic lucency. Remainder of the tibia is intact. Fibula is normal. No periosteal reaction or bony destruction. Generalized soft tissue edema. Anterior skin staples about the knee. Calcifications in the soft tissues anteromedially are likely phleboliths. IMPRESSION: Generalized soft tissue edema of the lower leg. No acute osseous abnormality. Electronically Signed   By: Keith Rake M.D.   On: 12/28/2019 19:48   CT ABDOMEN PELVIS W CONTRAST  Result Date: 12/29/2019 CLINICAL DATA:  Recent right knee replacement, concern for extending DVT  EXAM: CT ABDOMEN AND PELVIS WITH CONTRAST TECHNIQUE: Multidetector CT imaging of the abdomen and pelvis was performed using the standard protocol following bolus administration of intravenous contrast. CONTRAST:  178m OMNIPAQUE IOHEXOL 300 MG/ML  SOLN COMPARISON:  CT abdomen pelvis 09/22/2019 FINDINGS: Lower chest: There is an increasing size and number of the multiple centrally cavitating nodules in both lower lobes largest in the right lower lobe measuring up to 9.4 mm, previously 4 mm (2/30). No consolidation or effusion. Normal heart size. No pericardial effusion. Hepatobiliary: No focal liver abnormality is seen. Patient is post cholecystectomy. Slight prominence of the biliary tree likely related to reservoir effect. No calcified intraductal gallstones. Pancreas: Unremarkable. No  pancreatic ductal dilatation or surrounding inflammatory changes. Spleen: Normal in size without focal abnormality. Adrenals/Urinary Tract: Normal adrenal glands. Moderate left hydroureteronephrosis to the level of soft tissue thickening in the distal left ureter (2/76) which is unclear if this arises de novo or is related to the adjacent soft tissue thickening and the level of the cervix in this patient with known cervical cancer. There is an associated delayed left nephrogram and delayed excretion on excretory phase imaging. No right urinary tract dilatation. No suspicious renal lesions or urolithiasis. Urinary bladder is otherwise unremarkable. Stomach/Bowel: Distal esophagus, stomach and duodenum are unremarkable. No small bowel dilatation or wall thickening. There are postsurgical changes from prior right hemicolectomy with patent anastomosis. Much of the proximal colon is fluid-filled with minimal formed stool distally. There is distal circumferential mural thickening of the sigmoid and rectum. Vascular/Lymphatic: Atherosclerotic plaque within the normal caliber aorta. No venous filling defects are visualized in the iliac veins  or IVC. No pathologic enlarged nodes seen in the abdomen or pelvis. Reproductive: Anteverted uterus. There is some irregular thickening at the level of the cervix which extends left anterolaterally with loss of discernible fat plane with the adjacent dilated left ureter (2/75). No concerning adnexal lesions. Other: There is small amount of presacral stranding and thickening, similar to the comparison studies. Postsurgical changes of the anterior abdominal wall are noted. Mild body wall edema. Musculoskeletal: Levocurvature of the lumbar spine with an apex at L4 is similar to prior. Diffuse sclerotic changes are noted at the SI joints. Appearance is similar to prior. Chronic insufficiency fractures are noted bilaterally. There is asymmetric sclerosis of the left pubic body stable to comparison. IMPRESSION: 1. Moderate left hydroureteronephrosis to the level of soft tissue thickening in the distal left ureter which is possibly the result of local recurrence at the level of the cervix given loss of clearly discernible fat plane from the adjacent cervical soft tissues versus a de novo process at the ureterovesicular junction. 2. Interval increase in size and number of the multiple centrally cavitating nodules in both lower lobes largest in the right lower lobe measuring up to 9.4 mm, previously 4 mm. 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 3. Circumferential mural thickening of the sigmoid and rectum may represent infectious or inflammatory colitis. 4. Stable appearance of the presacral stranding and thickening, correlate for prior treatment related changes. 5. Sclerotic appearance of the left pubic body, questionable lucency may reflect a pathologic fracture of the appear similar to prior. 6. Bilateral sacral insufficiency fractures and sclerosis of the sacral ala. 7. Aortic Atherosclerosis (ICD10-I70.0). These results were called by telephone at the time of interpretation on 12/29/2019 at 12:03  am to provider Dr Joan Mayans, who verbally acknowledged these results. Electronically Signed   By: Lovena Le M.D.   On: 12/29/2019 00:03   CT FEMUR RIGHT W CONTRAST  Result Date: 12/30/2019 CLINICAL DATA:  Right lower extremity pain and swelling. Right knee arthroplasty on 12/24/2019 EXAM: CT OF THE LOWER RIGHT EXTREMITY WITH CONTRAST TECHNIQUE: Multidetector CT imaging of the lower right extremity was performed according to the standard protocol following intravenous contrast administration. COMPARISON:  X-ray 12/28/2019, CT 12/28/2019, 09/22/2019 CONTRAST:  114m OMNIPAQUE IOHEXOL 300 MG/ML  SOLN FINDINGS: Bones/Joint/Cartilage Postsurgical changes from right total knee arthroplasty. Metallic streak artifact from hardware slightly degrades evaluation of the adjacent structures. No periprosthetic lucency or fracture is identified. There is a very large suprapatellar knee joint effusion with internal hyperdense contents suggesting hemarthrosis (series 6, image 273;  series 10, image 94). Approximate measurements are 10 x 9 x 5 cm (volume = 200 cm^3). The visualized portion of the parasymphyseal left pubic bone again demonstrates patchy sclerosis with possible nondisplaced fracture line (series 5, image 73; series 7, image 86). Pubic symphysis intact without diastasis. Right hip joint is intact without fracture or dislocation. Joint spaces maintained without significant arthropathy. Images of the right ankle and hindfoot demonstrate no acute fracture or malalignment. Joint spaces are preserved. Ligaments Suboptimally assessed by CT. Muscles and Tendons Preserved muscle bulk without atrophy or fatty infiltration. No intramuscular fluid collection. No tendinous abnormality identified within the limitations of CT. Soft tissues Small amount of fluid within the subcutaneous soft tissues overlying the anterior aspect of the thigh and lower leg, likely postoperative. No well-defined or drainable fluid collection within the  subcutaneous soft tissues. No deep fascial fluid. No fascial thickening or enhancement. No inguinal lymphadenopathy. Vascular structures appear intact. IMPRESSION: 1. Very large suprapatellar knee joint effusion with internal hyperdense contents suggesting hemarthrosis (approximate volume 200 mL). 2. 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 3. Patchy sclerosis of the visualized portion of the parasymphyseal left pubic bone with possible nondisplaced fracture line, similar in appearance to the prior study 09/22/2019. Electronically Signed   By: Davina Poke D.O.   On: 12/30/2019 15:33   CT TIBIA FIBULA RIGHT W CONTRAST  Result Date: 12/30/2019 CLINICAL DATA:  Right lower extremity pain and swelling. Right knee arthroplasty on 12/24/2019 EXAM: CT OF THE LOWER RIGHT EXTREMITY WITH CONTRAST TECHNIQUE: Multidetector CT imaging of the lower right extremity was performed according to the standard protocol following intravenous contrast administration. COMPARISON:  X-ray 12/28/2019, CT 12/28/2019, 09/22/2019 CONTRAST:  169m OMNIPAQUE IOHEXOL 300 MG/ML  SOLN FINDINGS: Bones/Joint/Cartilage Postsurgical changes from right total knee arthroplasty. Metallic streak artifact from hardware slightly degrades evaluation of the adjacent structures. No periprosthetic lucency or fracture is identified. There is a very large suprapatellar knee joint effusion with internal hyperdense contents suggesting hemarthrosis (series 6, image 273; series 10, image 94). Approximate measurements are 10 x 9 x 5 cm (volume = 200 cm^3). The visualized portion of the parasymphyseal left pubic bone again demonstrates patchy sclerosis with possible nondisplaced fracture line (series 5, image 73; series 7, image 86). Pubic symphysis intact without diastasis. Right hip joint is intact without fracture or dislocation. Joint spaces maintained without significant arthropathy. Images of the right ankle and hindfoot demonstrate no  acute fracture or malalignment. Joint spaces are preserved. Ligaments Suboptimally assessed by CT. Muscles and Tendons Preserved muscle bulk without atrophy or fatty infiltration. No intramuscular fluid collection. No tendinous abnormality identified within the limitations of CT. Soft tissues Small amount of fluid within the subcutaneous soft tissues overlying the anterior aspect of the thigh and lower leg, likely postoperative. No well-defined or drainable fluid collection within the subcutaneous soft tissues. No deep fascial fluid. No fascial thickening or enhancement. No inguinal lymphadenopathy. Vascular structures appear intact. IMPRESSION: 1. Very large suprapatellar knee joint effusion with internal hyperdense contents suggesting hemarthrosis (approximate volume 200 mL). 2. 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 3. Patchy sclerosis of the visualized portion of the parasymphyseal left pubic bone with possible nondisplaced fracture line, similar in appearance to the prior study 09/22/2019. Electronically Signed   By: NDavina PokeD.O.   On: 12/30/2019 15:33   PERIPHERAL VASCULAR CATHETERIZATION  Result Date: 01/01/2020 See op note  [place] Im[place] Lower Unilateral Right  Result Date: 12/28/2019 CLINICAL DATA:  54year old female with recent  right knee replacement presenting with right lower extremity edema and ecchymosis. EXAM: Right LOWER EXTREMITY VENOUS DOPPLER ULTRASOUND TECHNIQUE: Gray-scale sonography with compression, as well as color and duplex ultrasound, were performed to evaluate the deep venous system(s) from the level of the common femoral vein through the popliteal and proximal calf veins. COMPARISON:  Ultrasound dated 12/27/2019. FINDINGS: VENOUS Normal compressibility of the common femoral, superficial femoral, and popliteal veins, as well as the visualized calf veins. Visualized portions of profunda femoral vein and great saphenous vein unremarkable. No filling  defects to suggest DVT on grayscale or color Doppler imaging. Doppler waveforms show normal direction of venous flow, normal respiratory phasicity and response to augmentation. Limited views of the contralateral common femoral vein are unremarkable. OTHER None. Limitations: none IMPRESSION: No femoropopliteal DVT nor evidence of DVT within the visualized calf veins. If clinical symptoms are inconsistent or if there are persistent or worsening symptoms, further imaging (possibly involving the iliac veins) may be warranted. Electronically Signed   By: Anner Crete M.D.   On: 12/28/2019 22:44   US Venous Img Lower Unilateral Right  Result Date: 12/27/2019 CLINICAL DATA:  54 year old with right leg swelling for 2 days. Recent knee replacement. EXAM: RIGHT LOWER EXTREMITY VENOUS DOPPLER ULTRASOUND TECHNIQUE: Gray-scale sonography with graded compression, as well as color Doppler and duplex ultrasound were performed to evaluate the lower extremity deep venous systems from the level of the common femoral vein and including the common femoral, femoral, profunda femoral, popliteal and calf veins including the posterior tibial, peroneal and gastrocnemius veins when visible. The superficial great saphenous vein was also interrogated. Spectral Doppler was utilized to evaluate flow at rest and with distal augmentation maneuvers in the common femoral, femoral and popliteal veins. COMPARISON:  None. FINDINGS: Contralateral Common Femoral Vein: Respiratory phasicity is normal and symmetric with the symptomatic side. No evidence of thrombus. Normal compressibility. Common Femoral Vein: No evidence of thrombus. Normal compressibility, respiratory phasicity and response to augmentation. Saphenofemoral Junction: No evidence of thrombus. Normal compressibility and flow on color Doppler imaging. Profunda Femoral Vein: No evidence of thrombus. Normal compressibility and flow on color Doppler imaging. Femoral Vein: No evidence of  thrombus. Normal compressibility, respiratory phasicity and response to augmentation. Popliteal Vein: No evidence of thrombus. Normal compressibility, respiratory phasicity and response to augmentation. Calf Veins: Due to leg edema, the calf veins cannot be confidently identified or evaluated on this examination. Limited Doppler flow in some of the tibial veins. Other Findings:  None. IMPRESSION: No evidence for deep venous thrombosis in the right leg above the knee. Unfortunately, the deep calf veins cannot be evaluated and cannot exclude DVT in the deep calf veins. Electronically Signed   By: Markus Daft M.D.   On: 12/27/2019 11:07   DG Knee Complete 4 Views Right  Result Date: 12/28/2019 CLINICAL DATA:  Postop pain, edema, and ecchymosis. Surgery on Wednesday, 4 days ago. EXAM: RIGHT KNEE - COMPLETE 4+ VIEW COMPARISON:  Knee radiograph 12/24/2019 FINDINGS: Right knee arthroplasty in unchanged alignment. No periprosthetic lucency or fracture. There is been patellar resurfacing. Large joint effusion, increased joint effusion since immediate postoperative exam. Previous intra-articular air has resolved. There are anterior skin staples in place with generalized soft tissue edema. IMPRESSION: Recent right knee arthroplasty in unchanged alignment. Large joint effusion, increased joint effusion since immediate postoperative exam. Generalized soft tissue edema, nonspecific. Electronically Signed   By: Keith Rake M.D.   On: 12/28/2019 19:47   [place] OR NERVE BLOCK-IMAGE ONLY [facility])  Result  Date: 12/24/2019 There is no interpretation for this exam.  This order is for images obtained during a surgical procedure.  Please See "Surgeries" Tab for more information regarding the procedure.       Subjective: Reports RLE edema better today. No new complaints  Discharge Exam: Vitals:   01/02/20 0034 01/02/20 0827  BP: (!) 144/93 (!) 154/89  Pulse: 90 92  Resp: 17 18  Temp: 98.4 F (36.9 C) 98.4 F (36.9 C)   SpO2: 99% 97%   Vitals:   01/01/20 1349 01/01/20 1647 01/02/20 0034 01/02/20 0827  BP:  (!) 124/95 (!) 144/93 (!) 154/89  Pulse: 94 (!) 102 90 92  Resp: 20 20 17 18   Temp:  98.2 F (36.8 C) 98.4 F (36.9 C) 98.4 F (36.9 C)  TempSrc:  Oral Oral Oral  SpO2: 95% 100% 99% 97%  Weight:      Height:        General: Pt is alert, awake, not in acute distress Cardiovascular: RRR, S1/S2 +, no rubs, no gallops Respiratory: CTA bilaterally, no wheezing, no rhonchi Abdominal: Soft, NT, ND, bowel sounds + Extremities: Right Leg swelling down, wrapped.     The results of significant diagnostics from this hospitalization (including imaging, microbiology, ancillary and laboratory) are listed below for reference.     Microbiology: Recent Results (from the past 240 hour(s))  Surgical PCR screen     Status: None   Collection Time: 12/24/19  6:26 AM   Specimen: Nasal Mucosa; Nasal Swab  Result Value Ref Range Status   MRSA, PCR NEGATIVE NEGATIVE Final   Staphylococcus aureus NEGATIVE NEGATIVE Final    Comment: (NOTE) The Xpert SA Assay (FDA approved for NASAL specimens in patients 57 years of age and older), is one component of a comprehensive surveillance program. It is not intended to diagnose infection nor to guide or monitor treatment. Performed at [facility], [place], [place] [address]   SARS CORONAVIRUS 2 (TAT 6-24 HRS) Nasopharyngeal Nasopharyngeal Swab     Status: None   Collection Time: 12/28/19 10:39 PM   Specimen: Nasopharyngeal Swab  Result Value Ref Range Status   SARS Coronavirus 2 NEGATIVE NEGATIVE Final    Comment: (NOTE) SARS-CoV-2 target nucleic acids are NOT DETECTED. The SARS-CoV-2 RNA is generally detectable in upper and lower respiratory specimens during the acute phase of infection. Negative results do not preclude SARS-CoV-2 infection, do not rule out co-infections with other pathogens, and should not be used as the sole basis  for treatment or other patient management decisions. Negative results must be combined with clinical observations, patient history, and epidemiological information. The expected result is Negative. Fact Sheet for Patients: SugarRoll.be Fact Sheet for Healthcare Providers: https://www.woods-mathews.com/ This test is not yet approved or cleared by the [place] FDA and  has been authorized for detection and/or diagnosis of SARS-CoV-2 by FDA under an Emergency Use Authorization (EUA). This EUA will remain  in effect (meaning this test can be used) for the duration of the COVID-19 declaration under Section 56 4(b)(1) of the Act, 21 U.S.C. section 360bbb-3(b)(1), unless the authorization is terminated or revoked sooner. Performed at [facility], [place] [address], [place], [place] [address]      Labs: BNP (last 3 results) Recent Labs    05/23/19 1219  BNP 78.6   Basic Metabolic Panel: Recent Labs  Lab 12/27/19 1032 12/28/19 1724 12/29/19 0702 12/30/19 0650 12/31/19 0557  NA 133* 132* 131* 135 137  K  3.7 3.6 3.4* 4.0 4.2  CL 96* 96* 95* 97* 99  CO2 26 26 24 30 29   GLUCOSE 78 71 89 116* 118*  BUN 9 10 7  5* 5*  CREATININE 0.80 0.87 0.82 0.67 0.74  CALCIUM 8.5* 8.5* 8.4* 8.6* 9.0   Liver Function Tests: Recent Labs  Lab 12/27/19 1032 12/28/19 1724 12/30/19 0650 12/31/19 0557  AST 80* 79* 55* 59*  ALT 61* 58* 43 45*  ALKPHOS 269* 241* 211* 225*  BILITOT 1.5* 1.4* 0.9 1.0  PROT 6.9 6.7 6.3* 6.7  ALBUMIN 3.1* 3.1* 2.8* 3.0*   No results for input(s): LIPASE, AMYLASE in the last 168 hours. No results for input(s): AMMONIA in the last 168 hours. CBC: Recent Labs  Lab 12/27/19 1032 12/28/19 1724 12/29/19 0702 12/30/19 0650 12/31/19 0557  WBC 5.1 5.6 4.1 4.7 5.9  NEUTROABS 4.0 4.2  --   --   --   HGB 9.7* 9.2* 8.4* 8.6* 8.9*  HCT 29.0* 28.2* 25.5* 26.0* 27.1*  MCV 98.6 100.4* 99.2 100.4* 98.9  PLT 264 286 245 249  307   Cardiac Enzymes: Recent Labs  Lab 12/28/19 2111  CKTOTAL 68   BNP: Invalid input(s): POCBNP CBG: No results for input(s): GLUCAP in the last 168 hours. D-Dimer No results for input(s): DDIMER in the last 72 hours. Hgb A1c No results for input(s): HGBA1C in the last 72 hours. Lipid Profile No results for input(s): CHOL, HDL, LDLCALC, TRIG, CHOLHDL, LDLDIRECT in the last 72 hours. Thyroid function studies No results for input(s): TSH, T4TOTAL, T3FREE, THYROIDAB in the last 72 hours.  Invalid input(s): FREET3 Anemia work up No results for input(s): VITAMINB12, FOLATE, FERRITIN, TIBC, IRON, RETICCTPCT in the last 72 hours. Urinalysis    Component Value Date/Time   COLORURINE AMBER (A) 12/22/2019 1014   APPEARANCEUR HAZY (A) 12/22/2019 1014   LABSPEC 1.023 12/22/2019 1014   PHURINE 5.0 12/22/2019 1014   GLUCOSEU NEGATIVE 12/22/2019 1014   HGBUR NEGATIVE 12/22/2019 1014   BILIRUBINUR SMALL (A) 12/22/2019 1014   KETONESUR NEGATIVE 12/22/2019 1014   PROTEINUR 30 (A) 12/22/2019 1014   NITRITE NEGATIVE 12/22/2019 1014   LEUKOCYTESUR TRACE (A) 12/22/2019 1014   Sepsis Labs Invalid input(s): PROCALCITONIN,  WBC,  LACTICIDVEN Microbiology Recent Results (from the past 240 hour(s))  Surgical PCR screen     Status: None   Collection Time: 12/24/19  6:26 AM   Specimen: Nasal Mucosa; Nasal Swab  Result Value Ref Range Status   MRSA, PCR NEGATIVE NEGATIVE Final   Staphylococcus aureus NEGATIVE NEGATIVE Final    Comment: (NOTE) The Xpert SA Assay (FDA approved for NASAL specimens in patients 14 years of age and older), is one component of a comprehensive surveillance program. It is not intended to diagnose infection nor to guide or monitor treatment. Performed at [facility], [place], [place] [address]   SARS CORONAVIRUS 2 (TAT 6-24 HRS) Nasopharyngeal Nasopharyngeal Swab     Status: None   Collection Time: 12/28/19 10:39 PM   Specimen:  Nasopharyngeal Swab  Result Value Ref Range Status   SARS Coronavirus 2 NEGATIVE NEGATIVE Final    Comment: (NOTE) SARS-CoV-2 target nucleic acids are NOT DETECTED. The SARS-CoV-2 RNA is generally detectable in upper and lower respiratory specimens during the acute phase of infection. Negative results do not preclude SARS-CoV-2 infection, do not rule out co-infections with other pathogens, and should not be used as the sole basis for treatment or other patient management decisions. Negative results must  be combined with clinical observations, patient history, and epidemiological information. The expected result is Negative. Fact Sheet for Patients: SugarRoll.be Fact Sheet for Healthcare Providers: https://www.woods-mathews.com/ This test is not yet approved or cleared by the [place] FDA and  has been authorized for detection and/or diagnosis of SARS-CoV-2 by FDA under an Emergency Use Authorization (EUA). This EUA will remain  in effect (meaning this test can be used) for the duration of the COVID-19 declaration under Section 56 4(b)(1) of the Act, 21 U.S.C. section 360bbb-3(b)(1), unless the authorization is terminated or revoked sooner. Performed at [facility], [place] [address] [place], [place] [address]    Intractable right knee and leg pain:likely from hemarthrosis from a/c and recent surgery. 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 Morphine prn for pain. [place] of RLE was neg for DVT. A/c discontinued s/p ivc filter   Intractable nausea and vomiting: etiology unclear, possiblyrelated to narcotic intake. Zofran prn. 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   Stage IV cervical cancer:with lung mets. Continue on home pain management & home steroid  regimen.  Hydroureteronephrosis: moderate as per CT scan. Likely secondary to stage IV cervical cancer. Uro following and recs apprec .ureteral stent or nephrostomy tube could add significant unneeded morbidity to her overall situation. If the hydronephrosis worsened, creatinine increased or there was some concern for infection of the upper left urinary tract , can revisit the issue. Urology also recommend periodic renal ultrasound in a few weeks.  Will need f/u as outpt.  Transaminitis: etiology unclear, trending down. Will continue to monitor   Hypokalemia: KCl repleated. Will continue to monitor  Vitamin B12 deficiency:gets vitamin B12 every 3 weeks.  Anxiety: severity unknown. Xanax prn  Time coordinating discharge: Over 30 minutes  SIGNED:   Nolberto Hanlon, MD  [facility] 01/02/2020, 4:34 PM Pager   If 7PM-7AM, please contact night-coverage [email or web address] Password [email or web address]

## 2020-01-02 NOTE — [place] Notes (Signed)
Joanna Hall   DOB:[birth year]   GD#:[id number]    Subjective: S/p IVC filter-no complications noted.  Patient resting comfortably.  Patient is oral pain medication has been titrated overnight.  She has been working with physical therapy.  Objective:  Vitals:   01/02/20 0827 01/02/20 1644  BP: (!) 154/89 (!) 153/91  Pulse: 92 98  Resp: 18 16  Temp: 98.4 F (36.9 C) 98.1 F (36.7 C)  SpO2: 97% 99%     Intake/Output Summary (Last 24 hours) at 01/02/2020 2249 Last data filed at 01/02/2020 1829 Gross per 24 hour  Intake 861.16 ml  Output --  Net 861.16 ml    Physical Exam  Constitutional: She is oriented to person, place, and time and well-developed, well-nourished, and in no distress.  HENT:  Head: Normocephalic and atraumatic.  Mouth/Throat: Oropharynx is clear and moist. No oropharyngeal exudate.  Eyes: Pupils are equal, round, and reactive to light.  Cardiovascular: Normal rate and regular rhythm.  Pulmonary/Chest: No respiratory distress. She has no wheezes.  Abdominal: Soft. Bowel sounds are normal. She exhibits no distension and no mass. There is no abdominal tenderness. There is no rebound and no guarding.  Musculoskeletal:        General: Edema present. No tenderness. Normal range of motion.     Cervical back: Normal range of motion and neck supple.     Comments: Swelling of the right lower extremity; Ace wrap-overall improved since admission.  Neurological: She is alert and oriented to person, place, and time.  Skin:  Ecchymosis on the right lower extremity.  But improved.  Psychiatric: Affect normal.     Labs:  Lab Results  Component Value Date   WBC 5.9 12/31/2019   HGB 8.9 (L) 12/31/2019   HCT 27.1 (L) 12/31/2019   MCV 98.9 12/31/2019   PLT 307 12/31/2019   NEUTROABS 4.2 12/28/2019    Lab Results  Component Value Date   NA 137 12/31/2019   K 4.2 12/31/2019   CL 99 12/31/2019   CO2 29 12/31/2019    Studies:  PERIPHERAL VASCULAR  CATHETERIZATION  Result Date: 01/01/2020 See op note   Malignant neoplasm of overlapping sites of cervix [facility]) # 54 year old female patient history of metastatic cervical cancer metastatic to lung; history of DVT PE factor V Leiden-most recently underwent right knee replacement is admitted to hospital for worsening swelling of the right lower extremity.  #Postoperative hematoma -status post right TKA-currently off anticoagulation-continue to hold anticoagulation.  Patient status post IVC filter.   #History of DVT PE/factor V Leiden-on indefinite anticoagulation; currently on hold-see above.  S/p IVC filter.  We will plan to start anticoagulation next week per patient basis.  Start with prophylactic dose.   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  CT scan abdomen pelvis-shows progression of disease in the lungs; also progressive disease in the left pelvis-worsening; clinically stable.  We will plan to start chemotherapy once orthopedics.  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  Monitor closely.  #Pain control--patient has chronic baseline pain because of arthritis/chronic abdominal pain-worsened given the hematoma right lower extremity.  Appreciate recommendations from cardiac care/pain medication adjusted.  Patient comfortable.  Okay to be discharged from oncology standpoint discussed with hospitalist; also discussed with [facility].  Cammie Sickle, MD 01/02/2020  10:49 PM

## 2020-01-02 NOTE — TOC Transition Note (Signed)
Transition of Care [facility]) - CM/SW Discharge Note   Patient Details  Name: Joanna Hall MRN: [id number] Date of Birth: 1966-08-17  Transition of Care [facility]) CM/SW Contact:  Su Hilt, RN Phone Number: 01/02/2020, 3:00 PM   Clinical Narrative:    Patient to DC home with [facility] for [facility] services, She has DME at home and does not need additional, she is up to date with her PCP  She can afford her medicaitons   Final next level of care: Home w Home Health Services Barriers to Discharge: Barriers Resolved   Patient Goals and CMS Choice        Discharge Placement                       Discharge Plan and Services                                     Social Determinants of Health (SDOH) Interventions     Readmission Risk Interventions Readmission Risk Prevention Plan 12/30/2019 09/26/2019 07/24/2019  Transportation Screening Complete Complete Complete  Medication Review (RN Care Manager) Complete Complete Referral to Pharmacy  PCP or Specialist appointment within 3-5 days of discharge Complete - Complete  HRI or Home Care Consult Complete - Complete  SW Recovery Care/Counseling Consult Complete - Complete  Palliative Care Screening Not Applicable - Not [place] - Not Applicable Not Applicable  Some recent data might be hidden

## 2020-01-02 NOTE — Consult Note (Signed)
[place]  Telephone:([phone] [phone]   Name: Joanna Hall Date: 01/02/2020 MRN: [id number]  DOB: August 16, 1966  Patient Care Team: Towanda Malkin, MD as PCP - General (Internal Medicine) Mellody Drown, MD as Consulting Physician (Obstetrics and Gynecology) [name], MD as Consulting Physician (Gastroenterology) Michael Boston, MD as Consulting Physician (General Surgery) Lovell Sheehan, MD as Consulting Physician (Orthopedic Surgery) Cammie Sickle, MD as Consulting Physician (Oncology) Earnestine Leys, MD as Consulting Physician (Orthopedic Surgery)    [place]: 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 Patienthas multiple recent hospitalizations and ER visits for nausea and abdominal pain.  Patient underwent right total knee arthroplasty on 12/24/2019.  She was subsequently readmitted 12/29/2019 with right knee pain and swelling.  CT revealed a large suprapatellar knee joint effusion with internal hyperdense contents suggesting hemarthrosis likely secondary to anticoagulation.  Anticoagulation was discontinued and patient had an IVC filter placed.  She has had severe pain.  Palliative care was consulted to help address symptoms and goals.  SOCIAL HISTORY:     reports that she quit smoking about 13 years ago. Her smoking use included cigarettes. She has a 2.50 pack-year smoking history. She has never used smokeless tobacco. She reports previous alcohol use. She reports current drug use. Drug: Marijuana.   Patient is not married. She lives at home with her mother and father. She has an adult daughter who lives in  [place]. She had another daughter who is now deceased.  ADVANCE DIRECTIVES:  DNR on file  CODE STATUS: DNR  PAST MEDICAL HISTORY: Past Medical History:  Diagnosis Date  . Abdominal pain 06/10/2018  . Abnormal cervical Papanicolaou smear 09/18/2017  . Anxiety   . Aortic atherosclerosis ([place])   . Arthritis    neck and knees  . 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 Blood transfusion without reported diagnosis   . Cervical cancer ([place]) 09/2016   mets lung  . Chronic anal fissure   . Chronic diarrhea   . Dyspnea   . Erosive gastropathy 09/18/2017  . Factor V Leiden mutation ([place])   . Fecal incontinence   . Genital warts   . GERD (gastroesophageal reflux disease)   . GI bleed 10/08/2018  . Heart murmur   . Hematochezia   . Hemorrhoids   . 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 Hepatitis, chronic ([place]) 05/05/2017  . History of cancer chemotherapy    completed 06/2017  . History of Clostridium difficile infection    while undergoing chemo.  Negative test 10/2017  . Ileocolic anastomotic leak   . 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 Intestinal infection due to Clostridium difficile 09/18/2017  . Macrocytic anemia with vitamin B12 deficiency   . Multiple gastric ulcers   . Nausea vomiting and diarrhea   . Pancolitis ([place]) 07/27/2018  . Perianal condylomata   . Pneumonia    History of  . Pulmonary nodules   . Rectal bleeding   . Small bowel obstruction ([place]) 08/2017  . Stiff neck    limited right turn  . Vitamin D deficiency     PAST SURGICAL HISTORY:  Past Surgical History:  Procedure Laterality Date  . CHOLECYSTECTOMY    . COLON SURGERY  08/2017   resection  . COLONOSCOPY WITH PROPOFOL N/A 12/20/2017   Procedure: COLONOSCOPY WITH PROPOFOL;  Surgeon: [name], MD;  Location: [facility] ENDOSCOPY;  Service: Gastroenterology;  Laterality: N/A;  . COLONOSCOPY WITH PROPOFOL N/A 07/30/2018     Procedure: COLONOSCOPY WITH PROPOFOL;  Surgeon: [name], MD;  Location: [facility] ENDOSCOPY;  Service: Gastroenterology;  Laterality: N/A;  . COLONOSCOPY WITH PROPOFOL N/A 10/10/2018   Procedure: COLONOSCOPY WITH PROPOFOL;  Surgeon: Lucilla Lame, MD;  Location: [facility] ENDOSCOPY;  Service: Endoscopy;  Laterality: N/A;  . DIAGNOSTIC LAPAROSCOPY    . ESOPHAGOGASTRODUODENOSCOPY (EGD) WITH PROPOFOL N/A 12/20/2017   Procedure: ESOPHAGOGASTRODUODENOSCOPY (EGD) WITH PROPOFOL;  Surgeon: [name], MD;  Location: [place];  Service: Gastroenterology;  Laterality: N/A;  . ESOPHAGOGASTRODUODENOSCOPY (EGD) WITH PROPOFOL  07/30/2018   Procedure: ESOPHAGOGASTRODUODENOSCOPY (EGD) WITH PROPOFOL;  Surgeon: [name], MD;  Location: [facility] ENDOSCOPY;  Service: Gastroenterology;;  . Otho Darner SIGMOIDOSCOPY N/A 11/21/2018   Procedure: FLEXIBLE SIGMOIDOSCOPY;  Surgeon: [name], MD;  Location: [facility] ENDOSCOPY;  Service: Gastroenterology;  Laterality: N/A;  . IVC FILTER INSERTION N/A 01/01/2020   Procedure: IVC FILTER INSERTION;  Surgeon: Algernon Huxley, MD;  Location: [place] CV LAB;  Service: Cardiovascular;  Laterality: N/A;  . LAPAROTOMY N/A 08/31/2017   Procedure: EXPLORATORY LAPAROTOMY for SBO, ileocolectomy, removal of piece of uterine wall;  Surgeon: [name], MD;  Location: [facility] ORS;  Service: General;  Laterality: N/A;  . LASER ABLATION CONDOLAMATA N/A 02/22/2018   Procedure: LASER ABLATION/REMOVAL OF [id number] AROUND ANUS AND VAGINA;  Surgeon: Michael Boston, MD;  Location: [place];  Service: General;  Laterality: N/A;  . OOPHORECTOMY    . PORTA CATH INSERTION N/A 05/13/2018   Procedure: PORTA CATH INSERTION;  Surgeon: Katha Cabal, MD;  Location: [place] CV LAB;  Service: Cardiovascular;  Laterality: N/A;  . SMALL INTESTINE SURGERY    . TANDEM RING INSERTION     x3  . THORACOTOMY    . TOTAL KNEE ARTHROPLASTY Left 04/24/2018    Procedure: TOTAL KNEE ARTHROPLASTY;  Surgeon: Lovell Sheehan, MD;  Location: [facility] ORS;  Service: Orthopedics;  Laterality: Left;  . TOTAL KNEE ARTHROPLASTY Right 12/24/2019   Procedure: RIGHT TOTAL KNEE ARTHROPLASTY;  Surgeon: Lovell Sheehan, MD;  Location: [facility] ORS;  Service: Orthopedics;  Laterality: Right;    HEMATOLOGY/ONCOLOGY HISTORY:  Oncology History Overview Note  # dec 2017- CERVICAL ADENO CA [[place]]; Stage IB [Dr. Christene Slates at [facility] in [place], [place];No surgery- Chemo-RT;   # 2018-sep - lung nodules [in [place]]  # AUG 20th, 2019-#1 Carbo-Taxol s/p 6 cycles- [No avastin sec to blood clots]: Feb 2020-bilateral lung nodules with 1 cm in size. March 2020- finished carbo-taxol #7; poor tolerance to [place].  # April 15 th 2020- Start Taxol weekly x 3; one week OFF;  # July 2020-CT- bil cavitary lesions- slightly bigger by few mm/overall STABLE; CT a/p-NED. STOP Taxol;  # July , 23rd 2020- Keytruda [CPS-15 ]x 4 cycles; CT mid OCT 2020-progressive disease in the lungs.;  Stop [facility]  # Oct 22nd Alimta q 3 w  # summer-/fall 2019-Bil PE/kidney infract; factor V Leiden Leiden - xarelto [small bowel obstruction]; moved to [facility]   #Fall 2019 PE/renal infarct on Xarelto-factor V Leiden  # NGS/[facility]- PDL CPS 15; NEG for other targets**  ------------------------------------------------------------- She was  treated by  Decision was made to pursue concurrent chemotherapy (weekly cisplatin) and radiation.  She received treatment from 11/2016-05/2017.  01/2017 cisplatin x 2 and carboplatin x 1 (01/29/2017) due to ARF and XRT.  XRT was followed by T & O on 02/01/2017 and T & N 02/10/2017 and 02/20/2017.  Course was complicated by 80 pound weight loss, nausea, vomiting, electrolyte wasting (potassium and magnesium).  She describes that.  Is been sick constantly requiring at least 20 hospitalizations.  Follow-up CT chest and PET on 06/2017. Per  patient, 'radiation worked' and no disease in the abdomen.  At that time she was noted to have lung nodules that were growing and follow-up imaging was scheduled for 10/2017.  She was admitted to hospital in [place] for small bowel obstruction which was managed conservatively and she was home for a week prior to traveling to [place] for Thanksgiving holiday where she has family.  She presented to ER in [place] on 08/2017 with nausea, vomiting, and lower abdominal pain.  Symptoms did not respond to conservative treatment.   CT on 08/26/2017 revealed small bowel obstruction with transition in the pelvis just superior to the uterus rather was a long segment of distal ileum with fatty wall thickening compatible with chronic inflammation and/or radiation enteritis. Imaging showed numerous pulmonary nodules consistent with metastatic disease. She underwent laparotomy and right ileocolectomy on 08/31/2017 at [facility].  Surgical findings revealed a thickened, matted, and scarred piece of distal small bowel close to the ileocecal valve.  She was discharged on 09/05/2017.  Pain markedly increased in intensity and imaging was performed on 09/11/2017 which revealed: Debris within the anterior abdominal wall incision concerning for infection versus packing material, s/p post ileo-colectomy with expected postoperative changes, mild colonic ileus, numerous pulmonary nodules highly concerning for metastatic disease, punctate nonobstructing nephrolithiasis.  Staples were removed and one was packed.  She was started on doxycycline.  Abdominal and pelvic CT without contrast on 09/11/2017 revealed debris within anterior abdominal wall incision concerning for infection, versus packing material.   She is s/p ileocolectomy with expected postoperative changes and mild colonic ileus.  There were numerous pulmonary nodules highly concerning for metastatic disease and punctate nonobstructing nephrolithiasis. She was  readmitted on 09/12/2017.  She describes the onset of lower abdominal pain on 09/09/2017.  Pain markedly increased in intensity on 09/11/2017.   Staples were removed and the wound packed.  She was started on doxycycline.  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   PET scan was ordered to evaluate enlarging lung nodules with concern for recurrent cervical cancer but scan was delayed due to insurance and need to be performed in [place].  Presented to ER on 12/03/2017 for abdominal pain and emesis.  Imaging concerning for worsening possible uterine infarct and she was admitted to hospital.  Pelvic MRI was unremarkable.  Remote scarring type changes of uterus thought to be possibly related to radiation.  She was discharged on 12/08/2017.  Underwent endoscopy and colonoscopy on 12/20/2017.    On 02/22/2018 she underwent laser ablation of condylomata around the anus and vagina under anesthesia with Dr. Johney Maine.   04/15/2018:  Chest,  abdomen, and pelvis CT revealed innumerable (> 100) cavitary nodules scattered in the lungs, moderately enlarging compared to the 11/08/2017 PET-CT, suspicious for metastatic disease.  One index node in the RLL measures 1.0 x 1.1 cm (previously 0.6 x 0.6 cm).  There were no new nodules.  There was an ill-defined wall thickening in the rectosigmoid with surrounding stranding along fascia planes, probably sequela from prior radiation therapy.  There was multilevel lumbar impingement due to spondylosis and degenerative disc disease.  There was heterogeneous enhancement in the uterus (some possibly from  prior radiation therapy).   04/23/2018:  PET scan revealed numerous scattered solid and cavitary nodules in the lungs stable increased in size compared to the prior PET-CT from 11/08/2017. Largest nodule was 1.1 cm in the LUL (SUV 1.9).  These demonstrated low-grade metabolic activity up to a maximum SUV of about 2.3, increased from 11/08/2017.    Case was discussed at tumor board on 04/25/2018. Consensus to pursue CT-guided biopsy (05/06/18) which revealed: Metastatic adenocarcinoma, morphologically consistent with cervical adenocarcinoma.  She has history of chronic hepatitis C which is managed by GI.  Hepatitis C genotype is 2a/2c.  She receives B12 injections for history of B12 deficiency.  On 04/24/2018 she underwent left total knee replacement with Dr. [name]. --------------------------------------------------------   DIAGNOSIS: Cervical adenocarcinoma  STAGE: IV        ;GOALS: Palliative  CURRENT/MOST RECENT THERAPY : Alimta [C]    Malignant neoplasm of overlapping sites of cervix ([place])  01/29/2019 - 04/23/2019 Chemotherapy   The patient had PACLitaxel (TAXOL) 156 mg in sodium chloride 0.9 % 250 mL chemo infusion (</= 44m/m2), 80 mg/m2 = 156 mg, Intravenous,  Once, 3 of 4 cycles Dose modification: 65 mg/m2 (original dose 80 mg/m2, Cycle 1, Reason: Provider Judgment) Administration: 156 mg (01/29/2019), 156 mg (02/05/2019), 126 mg (02/12/2019), 126 mg (02/26/2019), 126 mg (03/13/2019), 126 mg (03/27/2019), 126 mg (03/20/2019), 126 mg (04/03/2019), 126 mg (04/10/2019)  for chemotherapy treatment.    05/08/2019 - 07/30/2019 Chemotherapy   The patient had pembrolizumab (KEYTRUDA) 200 mg in sodium chloride 0.9 % 50 mL chemo infusion, 200 mg, Intravenous, Once, 4 of 6 cycles Administration: 200 mg (05/08/2019), 200 mg (05/29/2019), 200 mg (06/19/2019), 200 mg (07/10/2019)  for chemotherapy treatment.    08/07/2019 -  Chemotherapy   The patient had pegfilgrastim-cbqv ([facility] injection 6 mg, 6 mg,  Subcutaneous, Once, 3 of 5 cycles Administration: 6 mg (08/29/2019), 6 mg (09/19/2019) PEMEtrexed (ALIMTA) 1,000 mg in sodium chloride 0.9 % 100 mL chemo infusion, 950 mg, Intravenous,  Once, 4 of 6 cycles Administration: 1,000 mg (08/07/2019), 1,000 mg (09/18/2019), 1,000 mg (11/13/2019), 1,000 mg (08/28/2019)  for chemotherapy treatment.      ALLERGIES:  is allergic to ketamine.  MEDICATIONS:  Current Facility-Administered Medications  Medication Dose Route Frequency Provider Last Rate Last Admin  . 0.9 %  sodium chloride infusion   Intravenous Continuous DAlgernon Huxley MD 75 mL/hr at 01/01/20 1401 New Bag at 01/01/20 1401  . acetaminophen (TYLENOL) tablet 650 mg  650 mg Oral Q6H PRN DAlgernon Huxley MD   650 mg at 01/01/20 2026   Or  . acetaminophen (TYLENOL) suppository 650 mg  650 mg Rectal Q6H PRN DAlgernon Huxley MD      . albuterol (PROVENTIL) (2.5 MG/3ML) 0.083% nebulizer solution 2.5 mg  2.5 mg Inhalation Q4H PRN DAlgernon Huxley MD      . ALPRAZolam (Duanne Moron tablet 0.5 mg  0.5 mg Oral TID PRN DLeotis Pain  S, MD   0.5 mg at 01/01/20 2026  . amitriptyline (ELAVIL) tablet 100 mg  100 mg Oral QHS Algernon Huxley, MD   100 mg at 01/01/20 2212  . calcium-vitamin D (OSCAL WITH D) 500-200 MG-UNIT per tablet 1 tablet  1 tablet Oral Daily Algernon Huxley, MD   1 tablet at 12/31/19 [phone]  . Chlorhexidine Gluconate Cloth 2 % PADS 6 each  6 each Topical Daily Algernon Huxley, MD   6 each at 01/01/20 1002  . colchicine tablet 0.6 mg  0.6 mg Oral Daily Algernon Huxley, MD   0.6 mg at 12/31/19 [phone]  . docusate sodium (COLACE) capsule 100 mg  100 mg Oral BID Algernon Huxley, MD   100 mg at 01/01/20 2213  . famotidine (PEPCID) tablet 20 mg  20 mg Oral BID Algernon Huxley, MD   20 mg at 01/01/20 2213  . folic acid (FOLVITE) tablet 1 mg  1 mg Oral Daily Algernon Huxley, MD   1 mg at 12/31/19 6468  . hydrocortisone (CORTEF) tablet 10 mg  10 mg Oral BID Algernon Huxley, MD   10 mg at 01/01/20 2213  . HYDROmorphone (DILAUDID) injection 1 mg   1 mg Intravenous Once PRN Algernon Huxley, MD      . hydrOXYzine (ATARAX/VISTARIL) tablet 25 mg  25 mg Oral TID PRN Algernon Huxley, MD   25 mg at 12/29/19 2330  . hyoscyamine (LEVBID) 0.375 MG 12 hr tablet 0.375 mg  0.375 mg Oral Q12H Algernon Huxley, MD   0.375 mg at 01/01/20 2213  . magnesium hydroxide (MILK OF MAGNESIA) suspension 30 mL  30 mL Oral Daily PRN Algernon Huxley, MD   30 mL at 01/01/20 0754  . methocarbamol (ROBAXIN) tablet 500 mg  500 mg Oral Q6H PRN Algernon Huxley, MD   500 mg at 01/01/20 0754  . multivitamin with minerals tablet 1 tablet  1 tablet Oral Daily Algernon Huxley, MD   1 tablet at 12/31/19 [phone]  . ondansetron (ZOFRAN) tablet 4 mg  4 mg Oral Q6H PRN Algernon Huxley, MD   4 mg at 12/31/19 1541   Or  . ondansetron (ZOFRAN) injection 4 mg  4 mg Intravenous Q6H PRN Algernon Huxley, MD   4 mg at 12/30/19 2329  . ondansetron (ZOFRAN) injection 4 mg  4 mg Intravenous Q6H PRN Algernon Huxley, MD      . oxyCODONE (Oxy IR/ROXICODONE) immediate release tablet 15 mg  15 mg Oral Q3H PRN Lucill Mauck, Kirt Boys, NP   15 mg at 01/01/20 1725  . oxyCODONE (OXYCONTIN) 12 hr tablet 30 mg  30 mg Oral Q8H Danyel Tobey, Kirt Boys, NP   30 mg at 01/01/20 2212  . pantoprazole (PROTONIX) EC tablet 40 mg  40 mg Oral Daily Algernon Huxley, MD   40 mg at 12/31/19 0924  . potassium chloride SA (KLOR-CON) CR tablet 20 mEq  20 mEq Oral BID Algernon Huxley, MD   20 mEq at 01/01/20 2213  . sertraline (ZOLOFT) tablet 25 mg  25 mg Oral Daily Algernon Huxley, MD   25 mg at 12/31/19 2248  . sodium chloride flush (NS) 0.9 % injection 10-40 mL  10-40 mL Intracatheter Q12H Algernon Huxley, MD   10 mL at 12/31/19 2115  . sodium chloride flush (NS) 0.9 % injection 10-40 mL  10-40 mL Intracatheter PRN Algernon Huxley, MD      .  traZODone (DESYREL) tablet 25 mg  25 mg Oral QHS PRN Algernon Huxley, MD   25 mg at 01/01/20 2213   Facility-Administered Medications Ordered in Other Encounters  Medication Dose Route Frequency Provider Last Rate Last Admin  . heparin lock  flush 100 unit/mL  500 Units Intravenous Once [place], Melissa C, MD      . sodium chloride 0.9 % 1,000 mL with potassium chloride 20 mEq infusion   Intravenous Once Honor Loh E, NP      . sodium chloride flush (NS) 0.9 % injection 10 mL  10 mL Intravenous Once Dusty Wagoner, Kirt Boys, NP        VITAL SIGNS: BP (!) 154/89 (BP Location: Left Arm)   Pulse 92   Temp 98.4 F (36.9 C) (Oral)   Resp 18   Ht 5' 7.5" (1.715 m)   Wt 171 lb 15.3 oz (78 kg)   SpO2 97%   BMI 26.54 kg/m  Filed Weights   12/28/19 1737  Weight: 171 lb 15.3 oz (78 kg)    Estimated body mass index is 26.54 kg/m as calculated from the following:   Height as of this encounter: 5' 7.5" (1.715 m).   Weight as of this encounter: 171 lb 15.3 oz (78 kg).  LABS: CBC:    Component Value Date/Time   WBC 5.9 12/31/2019 0557   HGB 8.9 (L) 12/31/2019 0557   HCT 27.1 (L) 12/31/2019 0557   PLT 307 12/31/2019 0557   MCV 98.9 12/31/2019 0557   NEUTROABS 4.2 12/28/2019 1724   LYMPHSABS 0.8 12/28/2019 1724   MONOABS 0.6 12/28/2019 1724   EOSABS 0.0 12/28/2019 1724   BASOSABS 0.0 12/28/2019 1724   Comprehensive Metabolic Panel:    Component Value Date/Time   NA 137 12/31/2019 0557   K 4.2 12/31/2019 0557   CL 99 12/31/2019 0557   CO2 29 12/31/2019 0557   BUN 5 (L) 12/31/2019 0557   CREATININE 0.74 12/31/2019 0557   GLUCOSE 118 (H) 12/31/2019 0557   CALCIUM 9.0 12/31/2019 0557   AST 59 (H) 12/31/2019 0557   ALT 45 (H) 12/31/2019 0557   ALKPHOS 225 (H) 12/31/2019 0557   BILITOT 1.0 12/31/2019 0557   PROT 6.7 12/31/2019 0557   ALBUMIN 3.0 (L) 12/31/2019 0557    RADIOGRAPHIC STUDIES: DG Chest 2 View  Result Date: 12/11/2019 CLINICAL DATA:  Cough.  Cervical carcinoma EXAM: CHEST - 2 VIEW COMPARISON:  October 18, 2019 chest radiograph and chest CT October 06, 2019 FINDINGS: There is atelectatic change in the left base. There are scattered subcentimeter nodular lesions consistent with metastatic disease. Heart size  and pulmonary vascular normal. No adenopathy. Port-A-Cath tip is in the superior vena cava. No pneumothorax. No bone lesions IMPRESSION: Atelectatic change lateral left base with questionable earliest changes of pneumonia. Multiple subcentimeter nodular lesions consistent with metastatic foci. No adenopathy. Heart size normal. Port-A-Cath tip in superior vena cava. Electronically Signed   By: Lowella Grip III M.D.   On: 12/11/2019 10:41   DG Knee 1-2 Views Right  Result Date: 12/24/2019 CLINICAL DATA:  Right total knee replacement EXAM: RIGHT KNEE - 1-2 VIEW COMPARISON:  None. FINDINGS: Changes of right total knee replacement. No hardware or bony complicating feature. Soft tissue and joint space gas noted. IMPRESSION: Right knee replacement.  No visible complicating feature. Electronically Signed   By: Rolm Baptise M.D.   On: 12/24/2019 10:09   DG Tibia/Fibula Right  Result Date: 12/28/2019 CLINICAL DATA:  Postop pain, edema,  and ecchymosis. Surgery on Wednesday, 4 days ago. EXAM: RIGHT TIBIA AND FIBULA - 2 VIEW COMPARISON:  None. FINDINGS: Right knee arthroplasty in expected alignment. No periprosthetic lucency. Remainder of the tibia is intact. Fibula is normal. No periosteal reaction or bony destruction. Generalized soft tissue edema. Anterior skin staples about the knee. Calcifications in the soft tissues anteromedially are likely phleboliths. IMPRESSION: Generalized soft tissue edema of the lower leg. No acute osseous abnormality. Electronically Signed   By: Keith Rake M.D.   On: 12/28/2019 19:48   CT ABDOMEN PELVIS W CONTRAST  Result Date: 12/29/2019 CLINICAL DATA:  Recent right knee replacement, concern for extending DVT EXAM: CT ABDOMEN AND PELVIS WITH CONTRAST TECHNIQUE: Multidetector CT imaging of the abdomen and pelvis was performed using the standard protocol following bolus administration of intravenous contrast. CONTRAST:  174m OMNIPAQUE IOHEXOL 300 MG/ML  SOLN COMPARISON:  CT  abdomen pelvis 09/22/2019 FINDINGS: Lower chest: There is an increasing size and number of the multiple centrally cavitating nodules in both lower lobes largest in the right lower lobe measuring up to 9.4 mm, previously 4 mm (2/30). No consolidation or effusion. Normal heart size. No pericardial effusion. Hepatobiliary: No focal liver abnormality is seen. Patient is post cholecystectomy. Slight prominence of the biliary tree likely related to reservoir effect. No calcified intraductal gallstones. Pancreas: Unremarkable. No pancreatic ductal dilatation or surrounding inflammatory changes. Spleen: Normal in size without focal abnormality. Adrenals/Urinary Tract: Normal adrenal glands. Moderate left hydroureteronephrosis to the level of soft tissue thickening in the distal left ureter (2/76) which is unclear if this arises de novo or is related to the adjacent soft tissue thickening and the level of the cervix in this patient with known cervical cancer. There is an associated delayed left nephrogram and delayed excretion on excretory phase imaging. No right urinary tract dilatation. No suspicious renal lesions or urolithiasis. Urinary bladder is otherwise unremarkable. Stomach/Bowel: Distal esophagus, stomach and duodenum are unremarkable. No small bowel dilatation or wall thickening. There are postsurgical changes from prior right hemicolectomy with patent anastomosis. Much of the proximal colon is fluid-filled with minimal formed stool distally. There is distal circumferential mural thickening of the sigmoid and rectum. Vascular/Lymphatic: Atherosclerotic plaque within the normal caliber aorta. No venous filling defects are visualized in the iliac veins or IVC. No pathologic enlarged nodes seen in the abdomen or pelvis. Reproductive: Anteverted uterus. There is some irregular thickening at the level of the cervix which extends left anterolaterally with loss of discernible fat plane with the adjacent dilated left  ureter (2/75). No concerning adnexal lesions. Other: There is small amount of presacral stranding and thickening, similar to the comparison studies. Postsurgical changes of the anterior abdominal wall are noted. Mild body wall edema. Musculoskeletal: Levocurvature of the lumbar spine with an apex at L4 is similar to prior. Diffuse sclerotic changes are noted at the SI joints. Appearance is similar to prior. Chronic insufficiency fractures are noted bilaterally. There is asymmetric sclerosis of the left pubic body stable to comparison. IMPRESSION: 1. Moderate left hydroureteronephrosis to the level of soft tissue thickening in the distal left ureter which is possibly the result of local recurrence at the level of the cervix given loss of clearly discernible fat plane from the adjacent cervical soft tissues versus a de novo process at the ureterovesicular junction. 2. Interval increase in size and number of the multiple centrally cavitating nodules in both lower lobes largest in the right lower lobe measuring up to 9.4 mm, previously 4 mm. These  findings are compatible with worsening metastatic disease. 3. Circumferential mural thickening of the sigmoid and rectum may represent infectious or inflammatory colitis. 4. Stable appearance of the presacral stranding and thickening, correlate for prior treatment related changes. 5. Sclerotic appearance of the left pubic body, questionable lucency may reflect a pathologic fracture of the appear similar to prior. 6. Bilateral sacral insufficiency fractures and sclerosis of the sacral ala. 7. Aortic Atherosclerosis (ICD10-I70.0). These results were called by telephone at the time of interpretation on 12/29/2019 at 12:03 am to provider Dr Joan Mayans, who verbally acknowledged these results. Electronically Signed   By: Lovena Le M.D.   On: 12/29/2019 00:03   CT FEMUR RIGHT W CONTRAST  Result Date: 12/30/2019 CLINICAL DATA:  Right lower extremity pain and swelling. Right knee  arthroplasty on 12/24/2019 EXAM: CT OF THE LOWER RIGHT EXTREMITY WITH CONTRAST TECHNIQUE: Multidetector CT imaging of the lower right extremity was performed according to the standard protocol following intravenous contrast administration. COMPARISON:  X-ray 12/28/2019, CT 12/28/2019, 09/22/2019 CONTRAST:  15m OMNIPAQUE IOHEXOL 300 MG/ML  SOLN FINDINGS: Bones/Joint/Cartilage Postsurgical changes from right total knee arthroplasty. Metallic streak artifact from hardware slightly degrades evaluation of the adjacent structures. No periprosthetic lucency or fracture is identified. There is a very large suprapatellar knee joint effusion with internal hyperdense contents suggesting hemarthrosis (series 6, image 273; series 10, image 94). Approximate measurements are 10 x 9 x 5 cm (volume = 200 cm^3). The visualized portion of the parasymphyseal left pubic bone again demonstrates patchy sclerosis with possible nondisplaced fracture line (series 5, image 73; series 7, image 86). Pubic symphysis intact without diastasis. Right hip joint is intact without fracture or dislocation. Joint spaces maintained without significant arthropathy. Images of the right ankle and hindfoot demonstrate no acute fracture or malalignment. Joint spaces are preserved. Ligaments Suboptimally assessed by CT. Muscles and Tendons Preserved muscle bulk without atrophy or fatty infiltration. No intramuscular fluid collection. No tendinous abnormality identified within the limitations of CT. Soft tissues Small amount of fluid within the subcutaneous soft tissues overlying the anterior aspect of the thigh and lower leg, likely postoperative. No well-defined or drainable fluid collection within the subcutaneous soft tissues. No deep fascial fluid. No fascial thickening or enhancement. No inguinal lymphadenopathy. Vascular structures appear intact. IMPRESSION: 1. Very large suprapatellar knee joint effusion with internal hyperdense contents suggesting  hemarthrosis (approximate volume 200 mL). 2. 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 3. Patchy sclerosis of the visualized portion of the parasymphyseal left pubic bone with possible nondisplaced fracture line, similar in appearance to the prior study 09/22/2019. Electronically Signed   By: NDavina PokeD.O.   On: 12/30/2019 15:33   CT TIBIA FIBULA RIGHT W CONTRAST  Result Date: 12/30/2019 CLINICAL DATA:  Right lower extremity pain and swelling. Right knee arthroplasty on 12/24/2019 EXAM: CT OF THE LOWER RIGHT EXTREMITY WITH CONTRAST TECHNIQUE: Multidetector CT imaging of the lower right extremity was performed according to the standard protocol following intravenous contrast administration. COMPARISON:  X-ray 12/28/2019, CT 12/28/2019, 09/22/2019 CONTRAST:  1046mOMNIPAQUE IOHEXOL 300 MG/ML  SOLN FINDINGS: Bones/Joint/Cartilage Postsurgical changes from right total knee arthroplasty. Metallic streak artifact from hardware slightly degrades evaluation of the adjacent structures. No periprosthetic lucency or fracture is identified. There is a very large suprapatellar knee joint effusion with internal hyperdense contents suggesting hemarthrosis (series 6, image 273; series 10, image 94). Approximate measurements are 10 x 9 x 5 cm (volume = 200 cm^3). The visualized portion of the parasymphyseal left pubic bone  again demonstrates patchy sclerosis with possible nondisplaced fracture line (series 5, image 73; series 7, image 86). Pubic symphysis intact without diastasis. Right hip joint is intact without fracture or dislocation. Joint spaces maintained without significant arthropathy. Images of the right ankle and hindfoot demonstrate no acute fracture or malalignment. Joint spaces are preserved. Ligaments Suboptimally assessed by CT. Muscles and Tendons Preserved muscle bulk without atrophy or fatty infiltration. No intramuscular fluid collection. No tendinous abnormality identified within  the limitations of CT. Soft tissues Small amount of fluid within the subcutaneous soft tissues overlying the anterior aspect of the thigh and lower leg, likely postoperative. No well-defined or drainable fluid collection within the subcutaneous soft tissues. No deep fascial fluid. No fascial thickening or enhancement. No inguinal lymphadenopathy. Vascular structures appear intact. IMPRESSION: 1. Very large suprapatellar knee joint effusion with internal hyperdense contents suggesting hemarthrosis (approximate volume 200 mL). 2. 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 3. Patchy sclerosis of the visualized portion of the parasymphyseal left pubic bone with possible nondisplaced fracture line, similar in appearance to the prior study 09/22/2019. Electronically Signed   By: Davina Poke D.O.   On: 12/30/2019 15:33   PERIPHERAL VASCULAR CATHETERIZATION  Result Date: 01/01/2020 See op note  US Venous Img Lower Unilateral Right  Result Date: 12/28/2019 CLINICAL DATA:  54 year old female with recent right knee replacement presenting with right lower extremity edema and ecchymosis. EXAM: Right LOWER EXTREMITY VENOUS DOPPLER ULTRASOUND TECHNIQUE: Gray-scale sonography with compression, as well as color and duplex ultrasound, were performed to evaluate the deep venous system(s) from the level of the common femoral vein through the popliteal and proximal calf veins. COMPARISON:  Ultrasound dated 12/27/2019. FINDINGS: VENOUS Normal compressibility of the common femoral, superficial femoral, and popliteal veins, as well as the visualized calf veins. Visualized portions of profunda femoral vein and great saphenous vein unremarkable. No filling defects to suggest DVT on grayscale or color Doppler imaging. Doppler waveforms show normal direction of venous flow, normal respiratory phasicity and response to augmentation. Limited views of the contralateral common femoral vein are unremarkable. OTHER  None. Limitations: none IMPRESSION: No femoropopliteal DVT nor evidence of DVT within the visualized calf veins. If clinical symptoms are inconsistent or if there are persistent or worsening symptoms, further imaging (possibly involving the iliac veins) may be warranted. Electronically Signed   By: Anner Crete M.D.   On: 12/28/2019 22:44   US Venous Img Lower Unilateral Right  Result Date: 12/27/2019 CLINICAL DATA:  54 year old with right leg swelling for 2 days. Recent knee replacement. EXAM: RIGHT LOWER EXTREMITY VENOUS DOPPLER ULTRASOUND TECHNIQUE: Gray-scale sonography with graded compression, as well as color Doppler and duplex ultrasound were performed to evaluate the lower extremity deep venous systems from the level of the common femoral vein and including the common femoral, femoral, profunda femoral, popliteal and calf veins including the posterior tibial, peroneal and gastrocnemius veins when visible. The superficial great saphenous vein was also interrogated. Spectral Doppler was utilized to evaluate flow at rest and with distal augmentation maneuvers in the common femoral, femoral and popliteal veins. COMPARISON:  None. FINDINGS: Contralateral Common Femoral Vein: Respiratory phasicity is normal and symmetric with the symptomatic side. No evidence of thrombus. Normal compressibility. Common Femoral Vein: No evidence of thrombus. Normal compressibility, respiratory phasicity and response to augmentation. Saphenofemoral Junction: No evidence of thrombus. Normal compressibility and flow on color Doppler imaging. Profunda Femoral Vein: No evidence of thrombus. Normal compressibility and flow on color Doppler imaging. Femoral Vein: No evidence  of thrombus. Normal compressibility, respiratory phasicity and response to augmentation. Popliteal Vein: No evidence of thrombus. Normal compressibility, respiratory phasicity and response to augmentation. Calf Veins: Due to leg edema, the calf veins cannot be  confidently identified or evaluated on this examination. Limited Doppler flow in some of the tibial veins. Other Findings:  None. IMPRESSION: No evidence for deep venous thrombosis in the right leg above the knee. Unfortunately, the deep calf veins cannot be evaluated and cannot exclude DVT in the deep calf veins. Electronically Signed   By: Markus Daft M.D.   On: 12/27/2019 11:07   DG Knee Complete 4 Views Right  Result Date: 12/28/2019 CLINICAL DATA:  Postop pain, edema, and ecchymosis. Surgery on Wednesday, 4 days ago. EXAM: RIGHT KNEE - COMPLETE 4+ VIEW COMPARISON:  Knee radiograph 12/24/2019 FINDINGS: Right knee arthroplasty in unchanged alignment. No periprosthetic lucency or fracture. There is been patellar resurfacing. Large joint effusion, increased joint effusion since immediate postoperative exam. Previous intra-articular air has resolved. There are anterior skin staples in place with generalized soft tissue edema. IMPRESSION: Recent right knee arthroplasty in unchanged alignment. Large joint effusion, increased joint effusion since immediate postoperative exam. Generalized soft tissue edema, nonspecific. Electronically Signed   By: Keith Rake M.D.   On: 12/28/2019 19:47   [place] OR NERVE BLOCK-IMAGE ONLY [facility])  Result Date: 12/24/2019 There is no interpretation for this exam.  This order is for images obtained during a surgical procedure.  Please See "Surgeries" Tab for more information regarding the procedure.    PERFORMANCE STATUS (ECOG) : 2 - Symptomatic, <50% confined to bed  Review of Systems Unless otherwise noted, a complete review of systems is negative.  Physical Exam General: NAD, frail appearing, thin Pulmonary: unlabored Extremities: Right patellar edema with RLE bruising Skin: no rashes Neurological: Weakness but otherwise nonfocal  IMPRESSION: Patient reports feeling improved following increase to her OxyContin/oxycodone last night.  She says that she slept much  better.  Note plan for her to discharge home today with home health following.  She will have outpatient follow-up in our clinic.  Patient reports being comfortable with discharge plan.  We will send prescriptions to her outpatient pharmacy.  PDMP reviewed.  PLAN: -Home with home health when medically ready -Continue OxyContin 30 mg every 8 (#45) Continue oxycodone IR 15 mg every 3 hours as needed for breakthrough pain (#90) -DNR/DNI -Will follow in the clinic   Time Total: 30 minutes  Visit consisted of counseling and education dealing with the complex and emotionally intense issues of symptom management and palliative care in the setting of serious and potentially life-threatening illness.Greater than 50%  of this time was spent counseling and coordinating care related to the above assessment and plan.  Signed by: Altha Harm, PhD, NP-C

## 2020-01-06 ENCOUNTER — Other Ambulatory Visit: Payer: Self-pay | Admitting: Internal Medicine

## 2020-01-08 ENCOUNTER — Encounter: Payer: Self-pay | Admitting: Internal Medicine

## 2020-01-09 ENCOUNTER — Encounter: Payer: Self-pay | Admitting: Internal Medicine

## 2020-01-09 ENCOUNTER — Inpatient Hospital Stay: Payer: Medicaid Other

## 2020-01-09 ENCOUNTER — Inpatient Hospital Stay (HOSPITAL_BASED_OUTPATIENT_CLINIC_OR_DEPARTMENT_OTHER): Payer: Medicaid Other | Admitting: Internal Medicine

## 2020-01-09 DIAGNOSIS — C7802 Secondary malignant neoplasm of left lung: Secondary | ICD-10-CM | POA: Diagnosis not present

## 2020-01-09 DIAGNOSIS — C538 Malignant neoplasm of overlapping sites of cervix uteri: Secondary | ICD-10-CM

## 2020-01-09 DIAGNOSIS — G629 Polyneuropathy, unspecified: Secondary | ICD-10-CM | POA: Diagnosis not present

## 2020-01-09 DIAGNOSIS — Z79899 Other long term (current) drug therapy: Secondary | ICD-10-CM | POA: Diagnosis not present

## 2020-01-09 DIAGNOSIS — Z87891 Personal history of nicotine dependence: Secondary | ICD-10-CM | POA: Diagnosis not present

## 2020-01-09 DIAGNOSIS — E876 Hypokalemia: Secondary | ICD-10-CM | POA: Diagnosis not present

## 2020-01-09 DIAGNOSIS — C7801 Secondary malignant neoplasm of right lung: Secondary | ICD-10-CM | POA: Diagnosis not present

## 2020-01-09 DIAGNOSIS — Z86718 Personal history of other venous thrombosis and embolism: Secondary | ICD-10-CM | POA: Diagnosis not present

## 2020-01-09 DIAGNOSIS — Z8616 Personal history of COVID-19: Secondary | ICD-10-CM | POA: Diagnosis not present

## 2020-01-09 DIAGNOSIS — Z90721 Acquired absence of ovaries, unilateral: Secondary | ICD-10-CM | POA: Diagnosis not present

## 2020-01-09 DIAGNOSIS — E2740 Unspecified adrenocortical insufficiency: Secondary | ICD-10-CM | POA: Diagnosis not present

## 2020-01-09 DIAGNOSIS — Z7901 Long term (current) use of anticoagulants: Secondary | ICD-10-CM | POA: Diagnosis not present

## 2020-01-09 DIAGNOSIS — C539 Malignant neoplasm of cervix uteri, unspecified: Secondary | ICD-10-CM | POA: Diagnosis present

## 2020-01-09 DIAGNOSIS — Z9221 Personal history of antineoplastic chemotherapy: Secondary | ICD-10-CM | POA: Diagnosis not present

## 2020-01-09 LAB — COMPREHENSIVE METABOLIC PANEL
ALT: 27 U/L (ref 0–44)
AST: 51 U/L — ABNORMAL HIGH (ref 15–41)
Albumin: 3 g/dL — ABNORMAL LOW (ref 3.5–5.0)
Alkaline Phosphatase: 223 U/L — ABNORMAL HIGH (ref 38–126)
Anion gap: 14 (ref 5–15)
BUN: 7 mg/dL (ref 6–20)
CO2: 24 mmol/L (ref 22–32)
Calcium: 9 mg/dL (ref 8.9–10.3)
Chloride: 95 mmol/L — ABNORMAL LOW (ref 98–111)
Creatinine, Ser: 0.69 mg/dL (ref 0.44–1.00)
GFR calc Af Amer: >60 mL/min (ref >60)
GFR calc non Af Amer: >60 mL/min (ref >60)
Glucose, Bld: 90 mg/dL (ref 70–99)
Potassium: 3.5 mmol/L (ref 3.5–5.1)
Sodium: 133 mmol/L — ABNORMAL LOW (ref 135–145)
Total Bilirubin: 1.4 mg/dL — ABNORMAL HIGH (ref 0.3–1.2)
Total Protein: 7.5 g/dL (ref 6.5–8.1)

## 2020-01-09 LAB — CBC WITH DIFFERENTIAL/PLATELET
Abs Immature Granulocytes: 0.02 10*3/uL (ref 0.00–0.07)
Basophils Absolute: 0 10*3/uL (ref 0.0–0.1)
Basophils Relative: 0 %
Eosinophils Absolute: 0 10*3/uL (ref 0.0–0.5)
Eosinophils Relative: 1 %
HCT: 31 % — ABNORMAL LOW (ref 36.0–46.0)
Hemoglobin: 10.2 g/dL — ABNORMAL LOW (ref 12.0–15.0)
Immature Granulocytes: 0 %
Lymphocytes Relative: 15 %
Lymphs Abs: 0.8 10*3/uL (ref 0.7–4.0)
MCH: 31.8 pg (ref 26.0–34.0)
MCHC: 32.9 g/dL (ref 30.0–36.0)
MCV: 96.6 fL (ref 80.0–100.0)
Monocytes Absolute: 0.4 10*3/uL (ref 0.1–1.0)
Monocytes Relative: 7 %
Neutro Abs: 4.1 10*3/uL (ref 1.7–7.7)
Neutrophils Relative %: 77 %
Platelets: 286 10*3/uL (ref 150–400)
RBC: 3.21 MIL/uL — ABNORMAL LOW (ref 3.87–5.11)
RDW: 13.2 % (ref 11.5–15.5)
WBC: 5.4 10*3/uL (ref 4.0–10.5)
nRBC: 0 % (ref 0.0–0.2)

## 2020-01-09 LAB — MAGNESIUM: Magnesium: 1.4 mg/dL — ABNORMAL LOW (ref 1.7–2.4)

## 2020-01-09 MED ORDER — SODIUM CHLORIDE 0.9 % IV SOLN
Freq: Once | INTRAVENOUS | Status: AC
  Filled 2020-01-09: qty 250

## 2020-01-09 MED ORDER — MAGNESIUM SULFATE 4 GM/100ML IV SOLN
4.0000 g | Freq: Once | INTRAVENOUS | Status: AC
  Administered 2020-01-09: 4 g via INTRAVENOUS
  Filled 2020-01-09: qty 100

## 2020-01-09 MED ORDER — SODIUM CHLORIDE 0.9% FLUSH
10.0000 mL | Freq: Once | INTRAVENOUS | Status: AC
  Administered 2020-01-09: 10 mL via INTRAVENOUS
  Filled 2020-01-09: qty 10

## 2020-01-09 MED ORDER — HEPARIN SOD (PORK) LOCK FLUSH 100 UNIT/ML IV SOLN
500.0000 [IU] | Freq: Once | INTRAVENOUS | Status: AC
  Administered 2020-01-09: 500 [IU] via INTRAVENOUS
  Filled 2020-01-09: qty 5

## 2020-01-09 MED ORDER — HEPARIN SOD (PORK) LOCK FLUSH 100 UNIT/ML IV SOLN
INTRAVENOUS | Status: AC
  Filled 2020-01-09: qty 5

## 2020-01-09 NOTE — Progress Notes (Signed)
[place] NOTE  Patient Care Team: Towanda Malkin, MD as PCP - General (Internal Medicine) Mellody Drown, MD as Consulting Physician (Obstetrics and Gynecology) [name], MD as Consulting Physician (Gastroenterology) Michael Boston, MD as Consulting Physician (General Surgery) Lovell Sheehan, MD as Consulting Physician (Orthopedic Surgery) Cammie Sickle, MD as Consulting Physician (Oncology) Earnestine Leys, MD as Consulting Physician (Orthopedic Surgery)  CHIEF COMPLAINTS/PURPOSE OF CONSULTATION: Cervical cancer  #  Oncology History Overview Note  # dec 2017- CERVICAL ADENO CA [[place]]; Stage IB [Dr. Christene Slates at [facility] [place], [place];No surgery- Chemo-RT;   # 2018-sep - lung nodules [in [place]]  # AUG 20th, 2019-#1 Carbo-Taxol s/p 6 cycles- [No avastin sec to blood clots]: Feb 2020-bilateral lung nodules with 1 cm in size. March 2020- finished carbo-taxol #7; poor tolerance to [place].  # April 15 th 2020- Start Taxol weekly x 3; one week OFF;  # July 2020-CT- bil cavitary lesions- slightly bigger by few mm/overall STABLE; CT a/p-NED. 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  She received treatment from 11/2016-05/2017.  01/2017 cisplatin x 2 and carboplatin x 1 (01/29/2017) due to ARF and XRT.  XRT was followed by T & O on 02/01/2017 and T & N 02/10/2017 and 02/20/2017.  Course was  complicated by 80 pound weight loss, nausea, vomiting, electrolyte wasting (potassium and magnesium).  She describes that.  Is been sick constantly requiring at least 20 hospitalizations.  Follow-up CT chest and PET on 06/2017. Per patient, 'radiation worked' and no disease in the abdomen.  At that time she was noted to have lung nodules that were growing and follow-up imaging was scheduled for 10/2017.  She was admitted to hospital in [place] for small bowel obstruction which was managed conservatively and she was home for a week prior to traveling to [place] for Thanksgiving holiday where she has family.  She presented to ER in [place] on 08/2017 with nausea, vomiting, and lower abdominal pain.  Symptoms did not respond to conservative treatment.   CT on 08/26/2017 revealed small bowel obstruction with transition in the pelvis just superior to the uterus rather was a long segment of distal ileum with fatty wall thickening compatible with chronic inflammation and/or radiation enteritis. Imaging showed numerous pulmonary nodules consistent with metastatic disease. She underwent laparotomy and right ileocolectomy on 08/31/2017 at [facility]  Surgical findings revealed a thickened, matted, and scarred piece of distal small bowel close to the ileocecal valve.  She was discharged on 09/05/2017.  Pain markedly increased in intensity and imaging was performed on 09/11/2017 which revealed: Debris within the anterior abdominal wall incision concerning for infection versus packing material, s/p post ileo-colectomy with expected postoperative changes, mild colonic ileus, numerous pulmonary nodules highly concerning for metastatic disease, punctate nonobstructing nephrolithiasis.  Staples were removed and one was packed.  She was started on doxycycline.  Abdominal and pelvic CT without contrast on 09/11/2017 revealed debris within anterior abdominal wall incision concerning  for infection, versus packing  material.   She is s/p ileocolectomy with expected postoperative changes and mild colonic ileus.  There were numerous pulmonary nodules highly concerning for metastatic disease and punctate nonobstructing nephrolithiasis. She was readmitted on 09/12/2017.  She describes the onset of lower abdominal pain on 09/09/2017.  Pain markedly increased in intensity on 09/11/2017.   Staples were removed and the wound packed.  She was started on doxycycline.  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   PET scan was ordered to evaluate enlarging lung nodules with concern for recurrent cervical cancer but scan was delayed due to insurance and need to be performed in [place].  Presented to ER on 12/03/2017 for abdominal pain and emesis.  Imaging concerning for worsening possible uterine infarct and she was admitted to hospital.  Pelvic MRI was unremarkable.  Remote scarring type changes of uterus thought to be possibly related to radiation.  She was discharged on 12/08/2017.  Underwent endoscopy and colonoscopy on 12/20/2017.    On 02/22/2018 she underwent laser ablation of condylomata around the anus and vagina under anesthesia with Dr. Johney Maine.   04/15/2018:  Chest, abdomen, and pelvis CT revealed innumerable (> 100) cavitary nodules scattered in the lungs, moderately enlarging compared to the 11/08/2017 PET-CT, suspicious for metastatic disease.  One index node in the RLL measures 1.0 x 1.1 cm (previously 0.6 x 0.6 cm).  There were no new nodules.  There was an ill-defined wall thickening in the rectosigmoid with surrounding stranding  along fascia planes, probably sequela from prior radiation therapy.  There was multilevel lumbar impingement due to spondylosis and degenerative disc disease.  There was heterogeneous enhancement in the uterus (some possibly from prior radiation therapy).   04/23/2018:  PET scan revealed numerous scattered solid and cavitary nodules in the lungs stable increased in size compared to the prior PET-CT from 11/08/2017. Largest nodule was 1.1 cm in the LUL (SUV 1.9).  These demonstrated low-grade metabolic activity up to a maximum SUV of about 2.3, increased from 11/08/2017.    Case was discussed at tumor board on 04/25/2018. Consensus to pursue CT-guided biopsy (05/06/18) which revealed: Metastatic adenocarcinoma, morphologically consistent with cervical adenocarcinoma.  She has history of chronic hepatitis C which is managed by GI.  Hepatitis C genotype is 2a/2c.  She receives B12 injections for history of B12 deficiency.  On 04/24/2018 she underwent left total knee replacement with Dr. [name]. --------------------------------------------------------   DIAGNOSIS: Cervical adenocarcinoma  STAGE: IV        ;GOALS: Palliative  CURRENT/MOST RECENT THERAPY : Alimta [C]    Malignant neoplasm of overlapping sites of cervix ([place])  01/29/2019 - 04/23/2019 Chemotherapy   The patient had PACLitaxel (TAXOL) 156 mg in sodium chloride 0.9 % 250 mL chemo infusion (</= 56m/m2), 80 mg/m2 = 156 mg, Intravenous,  Once, 3 of 4 cycles Dose modification: 65 mg/m2 (original dose 80 mg/m2, Cycle 1, Reason: Provider Judgment) Administration: 156 mg (01/29/2019), 156 mg (02/05/2019), 126 mg (02/12/2019), 126 mg (02/26/2019), 126 mg (03/13/2019), 126 mg (03/27/2019), 126 mg (03/20/2019), 126 mg (04/03/2019), 126 mg (04/10/2019)  for chemotherapy treatment.    05/08/2019 - 07/30/2019 Chemotherapy  The patient had pembrolizumab (KEYTRUDA) 200 mg in sodium chloride 0.9 % 50 mL chemo infusion, 200 mg, Intravenous, Once, 4 of 6  cycles Administration: 200 mg (05/08/2019), 200 mg (05/29/2019), 200 mg (06/19/2019), 200 mg (07/10/2019)  for chemotherapy treatment.    08/07/2019 -  Chemotherapy   The patient had pegfilgrastim-cbqv [facility]) injection 6 mg, 6 mg, Subcutaneous, Once, 3 of 5 cycles Administration: 6 mg (08/29/2019), 6 mg (09/19/2019) PEMEtrexed (ALIMTA) 1,000 mg in sodium chloride 0.9 % 100 mL chemo infusion, 950 mg, Intravenous,  Once, 4 of 6 cycles Administration: 1,000 mg (08/07/2019), 1,000 mg (09/18/2019), 1,000 mg (11/13/2019), 1,000 mg (08/28/2019)  for chemotherapy treatment.       HISTORY OF PRESENTING ILLNESS:  Joanna Hall 54 y.o.  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  Patient underwent right knee replacement surgery on March 10th-which unfortunately was complicated by left knee hematoma.  Currently managed conservatively.  Patient was taken off anticoagulation-currently s/p IVC filter.  Patient also noted to have progression of disease with CT abdomen pelvis with left-sided hydronephrosis.  Eval by urology-conservative measures  Patient continues to complain of pain in the right knee.  However she has been working with physical therapy.  She has ace wrap.    Review of Systems  Constitutional: Positive for malaise/fatigue. Negative for chills, diaphoresis, fever and weight loss.  HENT: Negative for nosebleeds and sore throat.   Eyes: Negative for double vision.  Respiratory: Negative for cough, hemoptysis, sputum production, shortness of breath and wheezing.   Cardiovascular: Negative for chest pain, palpitations, orthopnea and leg swelling.  Gastrointestinal: Positive for abdominal pain and constipation. Negative for blood in stool, heartburn, melena, nausea and vomiting.  Genitourinary: Negative for dysuria, frequency and urgency.  Musculoskeletal: Positive for back  pain, joint pain and myalgias.  Skin: Negative.  Negative for itching and rash.  Neurological: Positive for tingling. Negative for dizziness, focal weakness, weakness and headaches.  Endo/Heme/Allergies: Does not bruise/bleed easily.  Psychiatric/Behavioral: Negative for depression. The patient is not nervous/anxious and does not have insomnia.      MEDICAL HISTORY:  Past Medical History:  Diagnosis Date  . Abdominal pain 06/10/2018  . Abnormal cervical Papanicolaou smear 09/18/2017  . Anxiety   . Aortic atherosclerosis ([place])   . Arthritis    neck and knees  . 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 Blood transfusion without reported diagnosis   . Cervical cancer ([place]) 09/2016   mets lung  . Chronic anal fissure   . Chronic diarrhea   . Dyspnea   . Erosive gastropathy 09/18/2017  . Factor V Leiden mutation ([place])   . Fecal incontinence   . Genital warts   . GERD (gastroesophageal reflux disease)   . GI bleed 10/08/2018  . Heart murmur   . Hematochezia   . Hemorrhoids   . 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 Hepatitis, chronic ([place]) 05/05/2017  . History of cancer chemotherapy    completed 06/2017  . History of Clostridium difficile infection    while undergoing chemo.  Negative test 10/2017  . Ileocolic anastomotic leak   . 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 Intestinal infection due to Clostridium difficile 09/18/2017  . Macrocytic anemia with vitamin B12 deficiency   . Multiple  gastric ulcers   . Nausea vomiting and diarrhea   . Pancolitis ([place]) 07/27/2018  . Perianal condylomata   . Pneumonia    History of  . Pulmonary nodules   . Rectal bleeding   . Small bowel obstruction ([place]) 08/2017  . Stiff neck    limited right turn  . Vitamin D deficiency     SURGICAL HISTORY: Past Surgical History:  Procedure Laterality Date  . CHOLECYSTECTOMY    . COLON SURGERY  08/2017   resection  . COLONOSCOPY WITH  PROPOFOL N/A 12/20/2017   Procedure: COLONOSCOPY WITH PROPOFOL;  Surgeon: [name], MD;  Location[facility] ENDOSCOPY;  Service: Gastroenterology;  Laterality: N/A;  . COLONOSCOPY WITH PROPOFOL N/A 07/30/2018   Procedure: COLONOSCOPY WITH PROPOFOL;  Surgeon: [name], MD;  Location: [facility] ENDOSCOPY;  Service: Gastroenterology;  Laterality: N/A;  . COLONOSCOPY WITH PROPOFOL N/A 10/10/2018   Procedure: COLONOSCOPY WITH PROPOFOL;  Surgeon: Lucilla Lame, MD;  Location: [facility] ENDOSCOPY;  Service: Endoscopy;  Laterality: N/A;  . DIAGNOSTIC LAPAROSCOPY    . ESOPHAGOGASTRODUODENOSCOPY (EGD) WITH PROPOFOL N/A 12/20/2017   Procedure: ESOPHAGOGASTRODUODENOSCOPY (EGD) WITH PROPOFOL;  Surgeon: [name], MD;  Location: [place];  Service: Gastroenterology;  Laterality: N/A;  . ESOPHAGOGASTRODUODENOSCOPY (EGD) WITH PROPOFOL  07/30/2018   Procedure: ESOPHAGOGASTRODUODENOSCOPY (EGD) WITH PROPOFOL;  Surgeon: [name], MD;  Location: [facility] ENDOSCOPY;  Service: Gastroenterology;;  . Otho Darner SIGMOIDOSCOPY N/A 11/21/2018   Procedure: FLEXIBLE SIGMOIDOSCOPY;  Surgeon: [name], MD;  Location: [facility] ENDOSCOPY;  Service: Gastroenterology;  Laterality: N/A;  . IVC FILTER INSERTION N/A 01/01/2020   Procedure: IVC FILTER INSERTION;  Surgeon: Algernon Huxley, MD;  Location: [place] CV LAB;  Service: Cardiovascular;  Laterality: N/A;  . LAPAROTOMY N/A 08/31/2017   Procedure: EXPLORATORY LAPAROTOMY for SBO, ileocolectomy, removal of piece of uterine wall;  Surgeon: [name], MD;  Location: [facility] ORS;  Service: General;  Laterality: N/A;  . LASER ABLATION CONDOLAMATA N/A 02/22/2018   Procedure: LASER ABLATION/REMOVAL OF [id number] AROUND ANUS AND VAGINA;  Surgeon: Michael Boston, MD;  Location: [place];  Service: General;  Laterality: N/A;  . OOPHORECTOMY    . PORTA CATH INSERTION N/A 05/13/2018   Procedure: PORTA CATH INSERTION;  Surgeon: Katha Cabal, MD;  Location: [place] CV LAB;  Service: Cardiovascular;  Laterality: N/A;  . SMALL INTESTINE SURGERY    . TANDEM RING INSERTION     x3  . THORACOTOMY    . TOTAL KNEE ARTHROPLASTY Left 04/24/2018   Procedure: TOTAL KNEE ARTHROPLASTY;  Surgeon: Lovell Sheehan, MD;  Location: [facility] ORS;  Service: Orthopedics;  Laterality: Left;  . TOTAL KNEE ARTHROPLASTY Right 12/24/2019   Procedure: RIGHT TOTAL KNEE ARTHROPLASTY;  Surgeon: Lovell Sheehan, MD;  Location: [facility] ORS;  Service: Orthopedics;  Laterality: Right;    SOCIAL HISTORY: Social History   Socioeconomic History  . Marital status: Divorced    Spouse name: Not on file  . Number of children: Not on file  . Years of education: Not on file  . Highest education level: Not on file  Occupational History  . Not on file  Tobacco Use  . Smoking status: Former Smoker    Packs/day: 0.25    Years: 10.00    Pack years: 2.50    Types: Cigarettes    Quit date: 10/16/2006    Years since quitting: 13.2  . Smokeless tobacco: Never Used  Substance and Sexual Activity  . Alcohol use: Not Currently  Comment: seldom  . Drug use: Yes    Types: Marijuana    Comment: not very often  . Sexual activity: Not Currently    Birth control/protection: Post-menopausal    Comment: Not Asked  Other Topics Concern  . Not on file  Social History Narrative  . Not on file   Social Determinants of Health   Financial Resource Strain:   . Difficulty of Paying Living Expenses:   Food Insecurity:   . Worried About Charity fundraiser in the Last Year:   . Arboriculturist in the Last Year:   Transportation Needs:   . Film/video editor (Medical):   Marland Kitchen Lack of Transportation (Non-Medical):   Physical Activity:   . Days of Exercise per Week:   . Minutes of Exercise per Session:   Stress:   . Feeling of Stress :   Social Connections:   . Frequency of Communication with Friends and Family:   . Frequency of Social Gatherings with Friends and  Family:   . Attends Religious Services:   . Active Member of Clubs or Organizations:   . Attends Archivist Meetings:   Marland Kitchen Marital Status:   Intimate Partner Violence:   . Fear of Current or Ex-Partner:   . Emotionally Abused:   Marland Kitchen Physically Abused:   . Sexually Abused:     FAMILY HISTORY: Family History  Problem Relation Age of Onset  . Hypertension Mother   . Hypertension Father   . Diabetes Father   . Hyperlipidemia Father   . Alcohol abuse Daughter   . Hypertension Maternal Grandmother   . Diabetes Maternal Grandmother   . Diabetes Paternal Grandmother   . Hypertension Paternal Grandmother     ALLERGIES:  is allergic to ketamine.  MEDICATIONS:  Current Outpatient Medications  Medication Sig Dispense Refill  . amitriptyline (ELAVIL) 100 MG tablet Take 1 tablet (100 mg total) by mouth at bedtime. 90 tablet 1  . Calcium Carb-Cholecalciferol (CALCIUM 500 +D) 500-400 MG-UNIT TABS Take 1 tablet by mouth daily.     . colchicine 0.6 MG tablet Take 0.6 mg by mouth daily.     . cyanocobalamin (,VITAMIN B-12,) 1000 MCG/ML injection Inject 1,000 mcg into the muscle every 21 ( twenty-one) days.     Marland Kitchen dexamethasone (DECADRON) 4 MG tablet Take 4 mg by mouth See admin instructions. Take 4 mg twice daily for 3 days starting 3 days prior to chemo    . diphenoxylate-atropine (LOMOTIL) 2.5-0.025 MG tablet Take 2 tablets by mouth 2 (two) times daily as needed for diarrhea or loose stools.    Marland Kitchen DM-Phenylephrine-Acetaminophen (COLD RELIEF PO) Take 2 tablets by mouth daily as needed (cold symptoms).    . docusate sodium (COLACE) 100 MG capsule Take 1 capsule (100 mg total) by mouth 2 (two) times daily. 10 capsule 0  . famotidine (PEPCID) 20 MG tablet Take 1 tablet (20 mg total) by mouth 2 (two) times daily. 60 tablet 0  . folic acid (FOLVITE) 1 MG tablet Take 1 tablet (1 mg total) by mouth daily. 30 tablet 6  . hydrocortisone (CORTEF) 10 MG tablet Take 1 tablet (10 mg total) by mouth 2  (two) times daily. 180 tablet 0  . hydrOXYzine (VISTARIL) 25 MG capsule Take 1 capsule (25 mg total) by mouth 3 (three) times daily as needed for itching. 30 capsule 0  . hyoscyamine (LEVBID) 0.375 MG 12 hr tablet Take 1 tablet (0.375 mg total) by mouth 2 (two) times  daily. 60 tablet 0  . methocarbamol (ROBAXIN) 500 MG tablet Take 1 tablet (500 mg total) by mouth every 6 (six) hours as needed for muscle spasms. 40 tablet 2  . Multiple Vitamins-Minerals (SUPER THERA VITE M) TABS Take 1 tablet by mouth daily.     . ondansetron (ZOFRAN ODT) 4 MG disintegrating tablet Take 1 tablet (4 mg total) by mouth every 8 (eight) hours as needed for nausea or vomiting. 20 tablet 0  . oxyCODONE (ROXICODONE) 15 MG immediate release tablet Take 1 tablet (15 mg total) by mouth every 3 (three) hours as needed (for breakthrough pain). 90 tablet 0  . oxyCODONE 30 MG 12 hr tablet Take 1 tablet (30 mg total) by mouth every 8 (eight) hours. 45 tablet 0  . pantoprazole (PROTONIX) 40 MG tablet Take 1 tablet (40 mg total) by mouth daily. 90 tablet 3  . promethazine (PHENERGAN) 25 MG tablet Take 1 tablet (25 mg total) by mouth every 8 (eight) hours as needed for nausea or vomiting. 30 tablet 0  . sertraline (ZOLOFT) 25 MG tablet Take 1 tablet (25 mg total) by mouth daily. 30 tablet 0  . triamcinolone ointment (KENALOG) 0.5 % Apply 1 application topically 2 (two) times daily. (Patient taking differently: Apply 1 application topically 2 (two) times daily as needed (itching). ) 30 g 0  . VENTOLIN HFA 108 (90 Base) MCG/ACT inhaler Inhale 1-2 puffs into the lungs every 4 (four) hours as needed for shortness of breath. 1 Inhaler 1   No current facility-administered medications for this visit.   Facility-Administered Medications Ordered in Other Visits  Medication Dose Route Frequency Provider Last Rate Last Admin  . heparin lock flush 100 unit/mL  500 Units Intravenous Once Corcoran, Melissa C, MD      . sodium chloride 0.9 % 1,000  mL with potassium chloride 20 mEq infusion   Intravenous Once Honor Loh E, NP      . sodium chloride flush (NS) 0.9 % injection 10 mL  10 mL Intravenous Once Borders, Kirt Boys, NP          .  PHYSICAL EXAMINATION: ECOG PERFORMANCE STATUS: 1 - Symptomatic but completely ambulatory  Vitals:   01/09/20 1308  BP: 113/75  Pulse: (!) 132  Temp: (!) 95.5 F (35.3 C)   Filed Weights   01/09/20 1308  Weight: 151 lb (68.5 kg)    Physical Exam  Constitutional: She is oriented to person, place, and time and well-developed, well-nourished, and in no distress.  Alone.  Patient in wheelchair.  HENT:  Head: Normocephalic and atraumatic.  Mouth/Throat: Oropharynx is clear and moist. No oropharyngeal exudate.  Eyes: Pupils are equal, round, and reactive to light.  Cardiovascular: Normal rate and regular rhythm.  Pulmonary/Chest: Effort normal and breath sounds normal. No respiratory distress. She has no wheezes.  Abdominal: Soft. Bowel sounds are normal. She exhibits no distension and no mass. There is no rebound and no guarding.  Musculoskeletal:        General: No tenderness or edema. Normal range of motion.     Cervical back: Normal range of motion and neck supple.     Comments: Swelling noted of the right knee/warmth noted.  Neurological: She is alert and oriented to person, place, and time.  Skin: Skin is warm.  Psychiatric: Affect normal.   LABORATORY DATA:  I have reviewed the data as listed Lab Results  Component Value Date   WBC 5.4 01/09/2020   HGB 10.2 (L) 01/09/2020  HCT 31.0 (L) 01/09/2020   MCV 96.6 01/09/2020   PLT 286 01/09/2020   Recent Labs    09/25/19 1338 09/26/19 0556 12/30/19 0650 12/31/19 0557 01/09/20 1244  NA  --    < > 135 137 133*  K  --    < > 4.0 4.2 3.5  CL  --    < > 97* 99 95*  CO2  --    < > 30 29 24   GLUCOSE  --    < > 116* 118* 90  BUN  --    < > 5* 5* 7  CREATININE  --    < > 0.67 0.74 0.69  CALCIUM  --    < > 8.6* 9.0 9.0   GFRNONAA  --    < > >60 >60 >60  GFRAA  --    < > >60 >60 >60  PROT 7.4   < > 6.3* 6.7 7.5  ALBUMIN 3.5   < > 2.8* 3.0* 3.0*  AST 83*   < > 55* 59* 51*  ALT 134*   < > 43 45* 27  ALKPHOS 180*   < > 211* 225* 223*  BILITOT 0.6   < > 0.9 1.0 1.4*  BILIDIR 0.2  --   --   --   --   IBILI 0.4  --   --   --   --    < > = values in this interval not displayed.    RADIOGRAPHIC STUDIES: I have personally reviewed the radiological images as listed and agreed with the findings in the report. DG Knee 1-2 Views Right  Result Date: 12/24/2019 CLINICAL DATA:  Right total knee replacement EXAM: RIGHT KNEE - 1-2 VIEW COMPARISON:  None. FINDINGS: Changes of right total knee replacement. No hardware or bony complicating feature. Soft tissue and joint space gas noted. IMPRESSION: Right knee replacement.  No visible complicating feature. Electronically Signed   By: Rolm Baptise M.D.   On: 12/24/2019 10:09   DG Tibia/Fibula Right  Result Date: 12/28/2019 CLINICAL DATA:  Postop pain, edema, and ecchymosis. Surgery on Wednesday, 4 days ago. EXAM: RIGHT TIBIA AND FIBULA - 2 VIEW COMPARISON:  None. FINDINGS: Right knee arthroplasty in expected alignment. No periprosthetic lucency. Remainder of the tibia is intact. Fibula is normal. No periosteal reaction or bony destruction. Generalized soft tissue edema. Anterior skin staples about the knee. Calcifications in the soft tissues anteromedially are likely phleboliths. IMPRESSION: Generalized soft tissue edema of the lower leg. No acute osseous abnormality. Electronically Signed   By: Keith Rake M.D.   On: 12/28/2019 19:48   CT ABDOMEN PELVIS W CONTRAST  Result Date: 12/29/2019 CLINICAL DATA:  Recent right knee replacement, concern for extending DVT EXAM: CT ABDOMEN AND PELVIS WITH CONTRAST TECHNIQUE: Multidetector CT imaging of the abdomen and pelvis was performed using the standard protocol following bolus administration of intravenous contrast. CONTRAST:   161m OMNIPAQUE IOHEXOL 300 MG/ML  SOLN COMPARISON:  CT abdomen pelvis 09/22/2019 FINDINGS: Lower chest: There is an increasing size and number of the multiple centrally cavitating nodules in both lower lobes largest in the right lower lobe measuring up to 9.4 mm, previously 4 mm (2/30). No consolidation or effusion. Normal heart size. No pericardial effusion. Hepatobiliary: No focal liver abnormality is seen. Patient is post cholecystectomy. Slight prominence of the biliary tree likely related to reservoir effect. No calcified intraductal gallstones. Pancreas: Unremarkable. No pancreatic ductal dilatation or surrounding inflammatory changes. Spleen: Normal in size without  focal abnormality. Adrenals/Urinary Tract: Normal adrenal glands. Moderate left hydroureteronephrosis to the level of soft tissue thickening in the distal left ureter (2/76) which is unclear if this arises de novo or is related to the adjacent soft tissue thickening and the level of the cervix in this patient with known cervical cancer. There is an associated delayed left nephrogram and delayed excretion on excretory phase imaging. No right urinary tract dilatation. No suspicious renal lesions or urolithiasis. Urinary bladder is otherwise unremarkable. Stomach/Bowel: Distal esophagus, stomach and duodenum are unremarkable. No small bowel dilatation or wall thickening. There are postsurgical changes from prior right hemicolectomy with patent anastomosis. Much of the proximal colon is fluid-filled with minimal formed stool distally. There is distal circumferential mural thickening of the sigmoid and rectum. Vascular/Lymphatic: Atherosclerotic plaque within the normal caliber aorta. No venous filling defects are visualized in the iliac veins or IVC. No pathologic enlarged nodes seen in the abdomen or pelvis. Reproductive: Anteverted uterus. There is some irregular thickening at the level of the cervix which extends left anterolaterally with loss of  discernible fat plane with the adjacent dilated left ureter (2/75). No concerning adnexal lesions. Other: There is small amount of presacral stranding and thickening, similar to the comparison studies. Postsurgical changes of the anterior abdominal wall are noted. Mild body wall edema. Musculoskeletal: Levocurvature of the lumbar spine with an apex at L4 is similar to prior. Diffuse sclerotic changes are noted at the SI joints. Appearance is similar to prior. Chronic insufficiency fractures are noted bilaterally. There is asymmetric sclerosis of the left pubic body stable to comparison. IMPRESSION: 1. Moderate left hydroureteronephrosis to the level of soft tissue thickening in the distal left ureter which is possibly the result of local recurrence at the level of the cervix given loss of clearly discernible fat plane from the adjacent cervical soft tissues versus a de novo process at the ureterovesicular junction. 2. Interval increase in size and number of the multiple centrally cavitating nodules in both lower lobes largest in the right lower lobe measuring up to 9.4 mm, previously 4 mm. 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 3. Circumferential mural thickening of the sigmoid and rectum may represent infectious or inflammatory colitis. 4. Stable appearance of the presacral stranding and thickening, correlate for prior treatment related changes. 5. Sclerotic appearance of the left pubic body, questionable lucency may reflect a pathologic fracture of the appear similar to prior. 6. Bilateral sacral insufficiency fractures and sclerosis of the sacral ala. 7. Aortic Atherosclerosis (ICD10-I70.0). These results were called by telephone at the time of interpretation on 12/29/2019 at 12:03 am to provider Dr Joan Mayans, who verbally acknowledged these results. Electronically Signed   By: Lovena Le M.D.   On: 12/29/2019 00:03   CT FEMUR RIGHT W CONTRAST  Result Date: 12/30/2019 CLINICAL DATA:   Right lower extremity pain and swelling. Right knee arthroplasty on 12/24/2019 EXAM: CT OF THE LOWER RIGHT EXTREMITY WITH CONTRAST TECHNIQUE: Multidetector CT imaging of the lower right extremity was performed according to the standard protocol following intravenous contrast administration. COMPARISON:  X-ray 12/28/2019, CT 12/28/2019, 09/22/2019 CONTRAST:  115m OMNIPAQUE IOHEXOL 300 MG/ML  SOLN FINDINGS: Bones/Joint/Cartilage Postsurgical changes from right total knee arthroplasty. Metallic streak artifact from hardware slightly degrades evaluation of the adjacent structures. No periprosthetic lucency or fracture is identified. There is a very large suprapatellar knee joint effusion with internal hyperdense contents suggesting hemarthrosis (series 6, image 273; series 10, image 94). Approximate measurements are 10 x 9 x 5  cm (volume = 200 cm^3). The visualized portion of the parasymphyseal left pubic bone again demonstrates patchy sclerosis with possible nondisplaced fracture line (series 5, image 73; series 7, image 86). Pubic symphysis intact without diastasis. Right hip joint is intact without fracture or dislocation. Joint spaces maintained without significant arthropathy. Images of the right ankle and hindfoot demonstrate no acute fracture or malalignment. Joint spaces are preserved. Ligaments Suboptimally assessed by CT. Muscles and Tendons Preserved muscle bulk without atrophy or fatty infiltration. No intramuscular fluid collection. No tendinous abnormality identified within the limitations of CT. Soft tissues Small amount of fluid within the subcutaneous soft tissues overlying the anterior aspect of the thigh and lower leg, likely postoperative. No well-defined or drainable fluid collection within the subcutaneous soft tissues. No deep fascial fluid. No fascial thickening or enhancement. No inguinal lymphadenopathy. Vascular structures appear intact. IMPRESSION: 1. Very large suprapatellar knee joint  effusion with internal hyperdense contents suggesting hemarthrosis (approximate volume 200 mL). 2. 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 3. Patchy sclerosis of the visualized portion of the parasymphyseal left pubic bone with possible nondisplaced fracture line, similar in appearance to the prior study 09/22/2019. Electronically Signed   By: Davina Poke D.O.   On: 12/30/2019 15:33   CT TIBIA FIBULA RIGHT W CONTRAST  Result Date: 12/30/2019 CLINICAL DATA:  Right lower extremity pain and swelling. Right knee arthroplasty on 12/24/2019 EXAM: CT OF THE LOWER RIGHT EXTREMITY WITH CONTRAST TECHNIQUE: Multidetector CT imaging of the lower right extremity was performed according to the standard protocol following intravenous contrast administration. COMPARISON:  X-ray 12/28/2019, CT 12/28/2019, 09/22/2019 CONTRAST:  165m OMNIPAQUE IOHEXOL 300 MG/ML  SOLN FINDINGS: Bones/Joint/Cartilage Postsurgical changes from right total knee arthroplasty. Metallic streak artifact from hardware slightly degrades evaluation of the adjacent structures. No periprosthetic lucency or fracture is identified. There is a very large suprapatellar knee joint effusion with internal hyperdense contents suggesting hemarthrosis (series 6, image 273; series 10, image 94). Approximate measurements are 10 x 9 x 5 cm (volume = 200 cm^3). The visualized portion of the parasymphyseal left pubic bone again demonstrates patchy sclerosis with possible nondisplaced fracture line (series 5, image 73; series 7, image 86). Pubic symphysis intact without diastasis. Right hip joint is intact without fracture or dislocation. Joint spaces maintained without significant arthropathy. Images of the right ankle and hindfoot demonstrate no acute fracture or malalignment. Joint spaces are preserved. Ligaments Suboptimally assessed by CT. Muscles and Tendons Preserved muscle bulk without atrophy or fatty infiltration. No intramuscular fluid  collection. No tendinous abnormality identified within the limitations of CT. Soft tissues Small amount of fluid within the subcutaneous soft tissues overlying the anterior aspect of the thigh and lower leg, likely postoperative. No well-defined or drainable fluid collection within the subcutaneous soft tissues. No deep fascial fluid. No fascial thickening or enhancement. No inguinal lymphadenopathy. Vascular structures appear intact. IMPRESSION: 1. Very large suprapatellar knee joint effusion with internal hyperdense contents suggesting hemarthrosis (approximate volume 200 mL). 2. 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 3. Patchy sclerosis of the visualized portion of the parasymphyseal left pubic bone with possible nondisplaced fracture line, similar in appearance to the prior study 09/22/2019. Electronically Signed   By: NDavina PokeD.O.   On: 12/30/2019 15:33   PERIPHERAL VASCULAR CATHETERIZATION  Result Date: 01/01/2020 See op note  [place] Im[place] Lower Unilateral Right  Result Date: 12/28/2019 CLINICAL DATA:  54year old female with recent right knee replacement presenting with right lower extremity edema and ecchymosis. EXAM:  Right LOWER EXTREMITY VENOUS DOPPLER ULTRASOUND TECHNIQUE: Gray-scale sonography with compression, as well as color and duplex ultrasound, were performed to evaluate the deep venous system(s) from the level of the common femoral vein through the popliteal and proximal calf veins. COMPARISON:  Ultrasound dated 12/27/2019. FINDINGS: VENOUS Normal compressibility of the common femoral, superficial femoral, and popliteal veins, as well as the visualized calf veins. Visualized portions of profunda femoral vein and great saphenous vein unremarkable. No filling defects to suggest DVT on grayscale or color Doppler imaging. Doppler waveforms show normal direction of venous flow, normal respiratory phasicity and response to augmentation. Limited views of the  contralateral common femoral vein are unremarkable. OTHER None. Limitations: none IMPRESSION: No femoropopliteal DVT nor evidence of DVT within the visualized calf veins. If clinical symptoms are inconsistent or if there are persistent or worsening symptoms, further imaging (possibly involving the iliac veins) may be warranted. Electronically Signed   By: Anner Crete M.D.   On: 12/28/2019 22:44   US Venous Img Lower Unilateral Right  Result Date: 12/27/2019 CLINICAL DATA:  54 year old with right leg swelling for 2 days. Recent knee replacement. EXAM: RIGHT LOWER EXTREMITY VENOUS DOPPLER ULTRASOUND TECHNIQUE: Gray-scale sonography with graded compression, as well as color Doppler and duplex ultrasound were performed to evaluate the lower extremity deep venous systems from the level of the common femoral vein and including the common femoral, femoral, profunda femoral, popliteal and calf veins including the posterior tibial, peroneal and gastrocnemius veins when visible. The superficial great saphenous vein was also interrogated. Spectral Doppler was utilized to evaluate flow at rest and with distal augmentation maneuvers in the common femoral, femoral and popliteal veins. COMPARISON:  None. FINDINGS: Contralateral Common Femoral Vein: Respiratory phasicity is normal and symmetric with the symptomatic side. No evidence of thrombus. Normal compressibility. Common Femoral Vein: No evidence of thrombus. Normal compressibility, respiratory phasicity and response to augmentation. Saphenofemoral Junction: No evidence of thrombus. Normal compressibility and flow on color Doppler imaging. Profunda Femoral Vein: No evidence of thrombus. Normal compressibility and flow on color Doppler imaging. Femoral Vein: No evidence of thrombus. Normal compressibility, respiratory phasicity and response to augmentation. Popliteal Vein: No evidence of thrombus. Normal compressibility, respiratory phasicity and response to  augmentation. Calf Veins: Due to leg edema, the calf veins cannot be confidently identified or evaluated on this examination. Limited Doppler flow in some of the tibial veins. Other Findings:  None. IMPRESSION: No evidence for deep venous thrombosis in the right leg above the knee. Unfortunately, the deep calf veins cannot be evaluated and cannot exclude DVT in the deep calf veins. Electronically Signed   By: Markus Daft M.D.   On: 12/27/2019 11:07   DG Knee Complete 4 Views Right  Result Date: 12/28/2019 CLINICAL DATA:  Postop pain, edema, and ecchymosis. Surgery on Wednesday, 4 days ago. EXAM: RIGHT KNEE - COMPLETE 4+ VIEW COMPARISON:  Knee radiograph 12/24/2019 FINDINGS: Right knee arthroplasty in unchanged alignment. No periprosthetic lucency or fracture. There is been patellar resurfacing. Large joint effusion, increased joint effusion since immediate postoperative exam. Previous intra-articular air has resolved. There are anterior skin staples in place with generalized soft tissue edema. IMPRESSION: Recent right knee arthroplasty in unchanged alignment. Large joint effusion, increased joint effusion since immediate postoperative exam. Generalized soft tissue edema, nonspecific. Electronically Signed   By: Keith Rake M.D.   On: 12/28/2019 19:47   [place] OR NERVE BLOCK-IMAGE ONLY [facility])  Result Date: 12/24/2019 There is no interpretation for this exam.  This order  is for images obtained during a surgical procedure.  Please See "Surgeries" Tab for more information regarding the procedure.    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  #CT scan March 2021-progression of lung nodules; also soft tissue in the pelvis causing mild hydronephrosis.[See below].  Given the ongoing hematoma right  knee/worsening pain I think is reasonable to hold off Alimta chemotherapy.  Can resume chemotherapy once acute issues resolve.  #Right knee pain swelling from hematoma post surgery-overall improving.  Continue to hold anticoagulation [see below]  #History of PE DVT factor V Leiden heterozygosity-anticoagulation is on hold because of right knee hematoma.  Patient s/p IVC filter placement.  Will resume anticoagulation when patient is improved from her hematoma standpoint  #Moderate left-sided hydronephrosis-likely secondary to tumor progression in the pelvis.  Creatinine stable.  Evaluated by urology-recommend conservative management at this time.   # Adrenal insufficiency-on hydrocortisone 20 mg/day; hydrocortisone in PM.  Stable  # Hypomagnesemia-secondary platinum chemotherapy-mag 1.4-stable mag infusion today.  #Chronic right lower quadrant abdominal pain/scar tissue versus others-poorly controlled- on oxycodone; poorly controlled given the worsening arthritic pain control.  Stable  # DISPOSITION:  # Mag IV infusions today.  #  Follow up in 2 weeks-MD; labs-cbc/cmp/mag; ; MD; no chemo; possible mag IV 2 hour. Dr.B     All questions were answered. The patient knows to call the clinic with any problems, questions or concerns.    Cammie Sickle, MD 01/14/2020 8:10 AM

## 2020-01-09 NOTE — Assessment & Plan Note (Addendum)
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  #CT scan March 2021-progression of lung nodules; also soft tissue in the pelvis causing mild hydronephrosis.[See below].  Given the ongoing hematoma right knee/worsening pain I think is reasonable to hold off Alimta chemotherapy.  Can resume chemotherapy once acute issues resolve.  #Right knee pain swelling from hematoma post surgery-overall improving.  Continue to hold anticoagulation [see below]  #History of PE DVT factor V Leiden heterozygosity-anticoagulation is on hold because of right knee hematoma.  Patient s/p IVC filter placement.  Will resume anticoagulation when patient is improved from her hematoma standpoint  #Moderate left-sided hydronephrosis-likely secondary to tumor progression in the pelvis.  Creatinine stable.  Evaluated by urology-recommend conservative management at this time.   # Adrenal insufficiency-on hydrocortisone 20 mg/day; hydrocortisone in PM.  Stable  # Hypomagnesemia-secondary platinum chemotherapy-mag 1.4-stable mag infusion today.  #Chronic right lower quadrant abdominal pain/scar tissue versus others-poorly controlled- on oxycodone; poorly controlled given the worsening arthritic pain control.  Stable  # DISPOSITION:  # Mag IV infusions today.  #  Follow up in 2 weeks-MD; labs-cbc/cmp/mag; ; MD; no chemo; possible mag IV 2 hour. Dr.B

## 2020-01-14 ENCOUNTER — Emergency Department: Payer: Medicaid Other

## 2020-01-14 ENCOUNTER — Telehealth: Payer: Self-pay | Admitting: *Deleted

## 2020-01-14 ENCOUNTER — Other Ambulatory Visit: Payer: Self-pay

## 2020-01-14 ENCOUNTER — Emergency Department
Admission: EM | Admit: 2020-01-14 | Discharge: 2020-01-14 | Disposition: A | Discharge status: Home / Self Care | Payer: Medicaid Other | Attending: Emergency Medicine | Admitting: Emergency Medicine

## 2020-01-14 ENCOUNTER — Other Ambulatory Visit: Payer: Self-pay | Admitting: Hospice and Palliative Medicine

## 2020-01-14 ENCOUNTER — Inpatient Hospital Stay: Payer: Medicaid Other | Admitting: Oncology

## 2020-01-14 ENCOUNTER — Other Ambulatory Visit: Payer: Self-pay | Admitting: *Deleted

## 2020-01-14 ENCOUNTER — Inpatient Hospital Stay: Payer: Medicaid Other

## 2020-01-14 DIAGNOSIS — Z79899 Other long term (current) drug therapy: Secondary | ICD-10-CM | POA: Diagnosis not present

## 2020-01-14 DIAGNOSIS — R1031 Right lower quadrant pain: Secondary | ICD-10-CM | POA: Diagnosis not present

## 2020-01-14 DIAGNOSIS — F1210 Cannabis abuse, uncomplicated: Secondary | ICD-10-CM | POA: Insufficient documentation

## 2020-01-14 DIAGNOSIS — E860 Dehydration: Secondary | ICD-10-CM

## 2020-01-14 DIAGNOSIS — Z9889 Other specified postprocedural states: Secondary | ICD-10-CM | POA: Insufficient documentation

## 2020-01-14 DIAGNOSIS — R1030 Lower abdominal pain, unspecified: Secondary | ICD-10-CM

## 2020-01-14 DIAGNOSIS — M79604 Pain in right leg: Secondary | ICD-10-CM | POA: Insufficient documentation

## 2020-01-14 DIAGNOSIS — Z8541 Personal history of malignant neoplasm of cervix uteri: Secondary | ICD-10-CM | POA: Insufficient documentation

## 2020-01-14 DIAGNOSIS — R109 Unspecified abdominal pain: Secondary | ICD-10-CM

## 2020-01-14 DIAGNOSIS — R112 Nausea with vomiting, unspecified: Secondary | ICD-10-CM

## 2020-01-14 DIAGNOSIS — Z95828 Presence of other vascular implants and grafts: Secondary | ICD-10-CM | POA: Insufficient documentation

## 2020-01-14 DIAGNOSIS — R1110 Vomiting, unspecified: Secondary | ICD-10-CM | POA: Insufficient documentation

## 2020-01-14 DIAGNOSIS — Z87891 Personal history of nicotine dependence: Secondary | ICD-10-CM | POA: Diagnosis not present

## 2020-01-14 DIAGNOSIS — R197 Diarrhea, unspecified: Secondary | ICD-10-CM

## 2020-01-14 DIAGNOSIS — I10 Essential (primary) hypertension: Secondary | ICD-10-CM | POA: Diagnosis not present

## 2020-01-14 LAB — URINALYSIS, COMPLETE (UACMP) WITH MICROSCOPIC
Bacteria, UA: NONE SEEN
Bilirubin Urine: NEGATIVE
Glucose, UA: NEGATIVE mg/dL
Hgb urine dipstick: NEGATIVE
Ketones, ur: 5 mg/dL — AB
Nitrite: NEGATIVE
Protein, ur: NEGATIVE mg/dL
Specific Gravity, Urine: >1.046 — ABNORMAL HIGH (ref 1.005–1.030)
pH: 7 (ref 5.0–8.0)

## 2020-01-14 LAB — COMPREHENSIVE METABOLIC PANEL
ALT: 17 U/L (ref 0–44)
AST: 32 U/L (ref 15–41)
Albumin: 3.4 g/dL — ABNORMAL LOW (ref 3.5–5.0)
Alkaline Phosphatase: 194 U/L — ABNORMAL HIGH (ref 38–126)
Anion gap: 11 (ref 5–15)
BUN: 6 mg/dL (ref 6–20)
CO2: 27 mmol/L (ref 22–32)
Calcium: 9.5 mg/dL (ref 8.9–10.3)
Chloride: 98 mmol/L (ref 98–111)
Creatinine, Ser: 0.69 mg/dL (ref 0.44–1.00)
GFR calc Af Amer: >60 mL/min (ref >60)
GFR calc non Af Amer: >60 mL/min (ref >60)
Glucose, Bld: 114 mg/dL — ABNORMAL HIGH (ref 70–99)
Potassium: 3.8 mmol/L (ref 3.5–5.1)
Sodium: 136 mmol/L (ref 135–145)
Total Bilirubin: 1 mg/dL (ref 0.3–1.2)
Total Protein: 8.1 g/dL (ref 6.5–8.1)

## 2020-01-14 LAB — CBC
HCT: 34.7 % — ABNORMAL LOW (ref 36.0–46.0)
Hemoglobin: 11 g/dL — ABNORMAL LOW (ref 12.0–15.0)
MCH: 31.2 pg (ref 26.0–34.0)
MCHC: 31.7 g/dL (ref 30.0–36.0)
MCV: 98.3 fL (ref 80.0–100.0)
Platelets: 238 10*3/uL (ref 150–400)
RBC: 3.53 MIL/uL — ABNORMAL LOW (ref 3.87–5.11)
RDW: 13.4 % (ref 11.5–15.5)
WBC: 4 10*3/uL (ref 4.0–10.5)
nRBC: 0 % (ref 0.0–0.2)

## 2020-01-14 LAB — LIPASE, BLOOD: Lipase: 59 U/L — ABNORMAL HIGH (ref 11–51)

## 2020-01-14 MED ORDER — ONDANSETRON HCL 4 MG/2ML IJ SOLN
4.0000 mg | Freq: Once | INTRAMUSCULAR | Status: AC
  Administered 2020-01-14: 4 mg via INTRAVENOUS
  Filled 2020-01-14: qty 2

## 2020-01-14 MED ORDER — OXYCODONE HCL 15 MG PO TABS: 15.0000 mg | ORAL_TABLET | ORAL | 0 refills | Status: DC | PRN

## 2020-01-14 MED ORDER — IOHEXOL 300 MG/ML  SOLN
100.0000 mL | Freq: Once | INTRAMUSCULAR | Status: AC | PRN
  Administered 2020-01-14: 12:00:00 100 mL via INTRAVENOUS

## 2020-01-14 MED ORDER — HYDROMORPHONE HCL 1 MG/ML IJ SOLN
0.5000 mg | INTRAMUSCULAR | Status: AC
  Administered 2020-01-14: 11:00:00 0.5 mg via INTRAVENOUS
  Filled 2020-01-14: qty 1

## 2020-01-14 MED ORDER — SODIUM CHLORIDE 0.9 % IV BOLUS
500.0000 mL | Freq: Once | INTRAVENOUS | Status: AC
  Administered 2020-01-14: 500 mL via INTRAVENOUS

## 2020-01-14 MED ORDER — ONDANSETRON 4 MG PO TBDP: 4.0000 mg | ORAL_TABLET | Freq: Four times a day (QID) | ORAL | 0 refills | Status: DC | PRN

## 2020-01-14 MED ORDER — HYDROMORPHONE HCL 1 MG/ML IJ SOLN
1.0000 mg | INTRAMUSCULAR | Status: AC
  Administered 2020-01-14: 1 mg via INTRAVENOUS
  Filled 2020-01-14: qty 1

## 2020-01-14 MED ORDER — HYDROMORPHONE HCL 1 MG/ML IJ SOLN
0.5000 mg | Freq: Once | INTRAMUSCULAR | Status: AC
  Administered 2020-01-14: 0.5 mg via INTRAVENOUS
  Filled 2020-01-14: qty 1

## 2020-01-14 NOTE — ED Provider Notes (Signed)
[facility] Emergency Department Provider Note ____________________________________________   First MD Initiated Contact with Patient 01/14/20 1014     (approximate)  I have reviewed the triage vital signs and the nursing notes.  HISTORY  Chief Complaint Abdominal Pain  HPI Joanna Hall is a 54 y.o. female with a very extensive medical history including recent IVC filter placement, right knee surgery, longstanding cancer, factor V Leiden with history of multiple clots  Patient reports that she left the hospital she was having pain in her right flank right buttock region after IVC filter placement and she has chronic pain due to "scarring" but it seems to have worsened quite a lot in the last 3 days with associated vomiting and pain primarily located over her right mid abdomen right lower abdomen and some around the right hip buttock region  No fevers or chills.  Nausea in the some occasional nonbloody vomiting for last 3 days.  Reports she think she is lost about 10 pounds has not hardly eaten anything for several days  No fevers or chills.  No known Covid exposure.  She does not take a blood thinning on longer but has an IVC filter now  Her her right knee has been healing well saw her surgeon yesterday for follow-up on that.  She is not seen any new swelling in her leg, but is concerned given the amount of pain that she has had that she wants to make sure is not a new or worsening blood clot developing in her right upper thigh as well   Past Medical History:  Diagnosis Date  . Abdominal pain 06/10/2018  . Abnormal cervical Papanicolaou smear 09/18/2017  . Anxiety   . Aortic atherosclerosis ([place])   . Arthritis    neck and knees  . 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 Blood transfusion without reported diagnosis   . Cervical cancer ([place]) 09/2016   mets lung  . Chronic anal fissure   . Chronic diarrhea   . Dyspnea   . Erosive  gastropathy 09/18/2017  . Factor V Leiden mutation ([place])   . Fecal incontinence   . Genital warts   . GERD (gastroesophageal reflux disease)   . GI bleed 10/08/2018  . Heart murmur   . Hematochezia   . Hemorrhoids   . 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 Hepatitis, chronic ([place]) 05/05/2017  . History of cancer chemotherapy    completed 06/2017  . History of Clostridium difficile infection    while undergoing chemo.  Negative test 10/2017  . Ileocolic anastomotic leak   . 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 Intestinal infection due to Clostridium difficile 09/18/2017  . Macrocytic anemia with vitamin B12 deficiency   . Multiple gastric ulcers   . Nausea vomiting and diarrhea   . Pancolitis ([place]) 07/27/2018  . Perianal condylomata   . Pneumonia    History of  . Pulmonary nodules   . Rectal bleeding   . Small bowel obstruction ([place]) 08/2017  . Stiff neck    limited right turn  . Vitamin D deficiency     Patient Active Problem List   Diagnosis Date Noted  . Hydroureter 12/29/2019  . Transaminitis   . Right leg pain 12/28/2019  . History of total knee arthroplasty, right 12/24/2019  . Moderate episode of recurrent major depressive disorder ([place]) 11/13/2019  .  Acute colitis 09/25/2019  . Anxiety and depression   . Thrombocytopenia ([place]) 08/17/2019  . Intractable lower abdominal pain   . Abdominal pain 07/23/2019  . Constipation 07/22/2019  . Malnutrition of moderate degree 06/22/2019  . Small bowel obstruction due to adhesions ([place]) 06/21/2019  . Acute gastroenteritis 06/14/2019  . Intractable nausea and vomiting 06/07/2019  . Chest pain 05/19/2019  . Intractable pain 05/19/2019  . Cancer associated pain   . Proctitis, radiation   . Palliative care encounter   . Encounter for monitoring opioid maintenance therapy 11/19/2018  . Adrenal insufficiency ([place]) 09/03/2018  . Nausea vomiting and diarrhea   . Chronic diarrhea  06/25/2018  . Chronic anticoagulation 06/25/2018  . Vomiting   . Encounter for antineoplastic chemotherapy 06/04/2018  . 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 Closed fracture of distal end of radius 05/04/2018  . History of total knee arthroplasty 04/24/2018  . Osteoarthritis of left knee 02/28/2018  . Condyloma acuminatum of anus s/p ablation 02/22/2018 02/22/2018  . Condyloma acuminatum of vagina s/p ablation 02/22/2018 02/22/2018  . Positive ANA (antinuclear antibody) 02/04/2018  . Aortic atherosclerosis ([place]) 01/15/2018  . Elevated MCV 01/15/2018  . Anemia 01/15/2018  . Genital warts 01/15/2018  . Vitamin D deficiency 01/15/2018  . Pernicious anemia   . Impingement syndrome of shoulder region 12/19/2017  . Multiple joint pain 12/19/2017  . Osteoarthritis of right knee 12/19/2017  . B12 deficiency 12/11/2017  . Lung nodules 12/11/2017  . History of Clostridium difficile infection 10/23/2017  . Factor V Leiden ([place]) 10/19/2017  . Goals of care, counseling/discussion 10/19/2017  . Cervical cancer, FIGO stage IB1 ([place]) 09/18/2017  . Chronic hepatitis C without hepatic coma ([place]) 09/18/2017  . Cytopenia 09/18/2017  . Diarrhea 09/18/2017  . Elevated LFTs 09/18/2017  . Lower abdominal pain 09/18/2017  . 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 Malignant neoplasm of overlapping sites of cervix ([place]) 09/18/2017  . Renal insufficiency 09/18/2017  . Wound infection after surgery 09/12/2017  . Hypokalemia   . Hypomagnesemia   . Cervical arthritis 07/18/2017  . Dysuria 06/20/2017  . Metastasis from cervical cancer ([place]) 05/12/2017  . Essential hypertension 03/15/2017  . Anemia of chronic disease 03/01/2017  . Chemotherapy-induced neutropenia ([place]) 01/29/2017  . 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 CHOLECYSTECTOMY    . COLON SURGERY  08/2017   resection  . COLONOSCOPY WITH  PROPOFOL N/A 12/20/2017   Procedure: COLONOSCOPY WITH PROPOFOL;  Surgeon: [name], MD;  Location: [facility] ENDOSCOPY;  Service: Gastroenterology;  Laterality: N/A;  . COLONOSCOPY WITH PROPOFOL N/A 07/30/2018   Procedure: COLONOSCOPY WITH PROPOFOL;  Surgeon: [name], MD;  Location: [facility] ENDOSCOPY;  Service: Gastroenterology;  Laterality: N/A;  . COLONOSCOPY WITH PROPOFOL N/A 10/10/2018   Procedure: COLONOSCOPY WITH PROPOFOL;  Surgeon: Lucilla Lame, MD;  Location: [facility] ENDOSCOPY;  Service: Endoscopy;  Laterality: N/A;  . DIAGNOSTIC LAPAROSCOPY    . ESOPHAGOGASTRODUODENOSCOPY (EGD) WITH PROPOFOL N/A 12/20/2017   Procedure: ESOPHAGOGASTRODUODENOSCOPY (EGD) WITH PROPOFOL;  Surgeon: [name], MD;  Location: [place];  Service: Gastroenterology;  Laterality: N/A;  . ESOPHAGOGASTRODUODENOSCOPY (EGD) WITH PROPOFOL  07/30/2018   Procedure: ESOPHAGOGASTRODUODENOSCOPY (EGD) WITH PROPOFOL;  Surgeon: [name], MD;  Location: [facility] ENDOSCOPY;  Service: Gastroenterology;;  . Otho Darner SIGMOIDOSCOPY N/A 11/21/2018   Procedure: FLEXIBLE SIGMOIDOSCOPY;  Surgeon: [name], MD;  Location: [facility] ENDOSCOPY;  Service: Gastroenterology;  Laterality: N/A;  .  IVC FILTER INSERTION N/A 01/01/2020   Procedure: IVC FILTER INSERTION;  Surgeon: Algernon Huxley, MD;  Location: [place] CV LAB;  Service: Cardiovascular;  Laterality: N/A;  . LAPAROTOMY N/A 08/31/2017   Procedure: EXPLORATORY LAPAROTOMY for SBO, ileocolectomy, removal of piece of uterine wall;  Surgeon: [name], MD;  Location: [facility] ORS;  Service: General;  Laterality: N/A;  . LASER ABLATION CONDOLAMATA N/A 02/22/2018   Procedure: LASER ABLATION/REMOVAL OF ELFYBOFBPZW AROUND ANUS AND VAGINA;  Surgeon: Michael Boston, MD;  Location: [place];  Service: General;  Laterality: N/A;  . OOPHORECTOMY    . PORTA CATH INSERTION N/A 05/13/2018   Procedure: PORTA CATH INSERTION;  Surgeon: Katha Cabal, MD;  Location: [place] CV LAB;  Service: Cardiovascular;  Laterality: N/A;  . SMALL INTESTINE SURGERY    . TANDEM RING INSERTION     x3  . THORACOTOMY    . TOTAL KNEE ARTHROPLASTY Left 04/24/2018   Procedure: TOTAL KNEE ARTHROPLASTY;  Surgeon: Lovell Sheehan, MD;  Location: [facility] ORS;  Service: Orthopedics;  Laterality: Left;  . TOTAL KNEE ARTHROPLASTY Right 12/24/2019   Procedure: RIGHT TOTAL KNEE ARTHROPLASTY;  Surgeon: Lovell Sheehan, MD;  Location: [facility] ORS;  Service: Orthopedics;  Laterality: Right;    Prior to Admission medications   Medication Sig Start Date End Date Taking? Authorizing Provider  amitriptyline (ELAVIL) 100 MG tablet Take 1 tablet (100 mg total) by mouth at bedtime. 11/10/19 02/08/20  [name], MD  Calcium Carb-Cholecalciferol (CALCIUM 500 +D) 500-400 MG-UNIT TABS Take 1 tablet by mouth daily.     [provider]  colchicine 0.6 MG tablet Take 0.6 mg by mouth daily.  06/19/19   [provider]  cyanocobalamin (,VITAMIN B-12,) 1000 MCG/ML injection Inject 1,000 mcg into the muscle every 21 ( twenty-one) days.  11/29/17   [provider]  dexamethasone (DECADRON) 4 MG tablet Take 4 mg by mouth See admin instructions. Take 4 mg twice daily for 3 days starting 3 days prior to chemo 08/01/19   [provider]  diphenoxylate-atropine (LOMOTIL) 2.5-0.025 MG tablet Take 2 tablets by mouth 2 (two) times daily as needed for diarrhea or loose stools. 09/29/19   Loletha Grayer, MD  DM-Phenylephrine-Acetaminophen (COLD RELIEF PO) Take 2 tablets by mouth daily as needed (cold symptoms).    [provider]  docusate sodium (COLACE) 100 MG capsule Take 1 capsule (100 mg total) by mouth 2 (two) times daily. 12/25/19   Carlynn Spry, PA-C  famotidine (PEPCID) 20 MG tablet Take 1 tablet (20 mg total) by mouth 2 (two) times daily. 10/18/19   Carrie Mew, MD  folic acid (FOLVITE) 1 MG tablet Take 1 tablet (1 mg total) by mouth  daily. 07/31/19   Cammie Sickle, MD  hydrocortisone (CORTEF) 10 MG tablet Take 1 tablet (10 mg total) by mouth 2 (two) times daily. 10/27/19 01/25/20  [name], MD  hydrOXYzine (VISTARIL) 25 MG capsule Take 1 capsule (25 mg total) by mouth 3 (three) times daily as needed for itching. 12/15/19   Cammie Sickle, MD  hyoscyamine (LEVBID) 0.375 MG 12 hr tablet Take 1 tablet (0.375 mg total) by mouth 2 (two) times daily. 12/17/19   [name], MD  methocarbamol (ROBAXIN) 500 MG tablet Take 1 tablet (500 mg total) by mouth every 6 (six) hours as needed for muscle spasms. 12/25/19   Carlynn Spry, PA-C  Multiple Vitamins-Minerals (SUPER THERA VITE M) TABS Take 1 tablet by  mouth daily.     [provider]  ondansetron (ZOFRAN ODT) 4 MG disintegrating tablet Take 1 tablet (4 mg total) by mouth every 6 (six) hours as needed for nausea or vomiting. 01/14/20   Delman Kitten, MD  oxyCODONE (ROXICODONE) 15 MG immediate release tablet Take 1 tablet (15 mg total) by mouth every 3 (three) hours as needed (for breakthrough pain). 01/02/20   Borders, Kirt Boys, NP  oxyCODONE 30 MG 12 hr tablet Take 1 tablet (30 mg total) by mouth every 8 (eight) hours. 01/02/20   Borders, Kirt Boys, NP  pantoprazole (PROTONIX) 40 MG tablet Take 1 tablet (40 mg total) by mouth daily. 12/05/19   Cammie Sickle, MD  promethazine (PHENERGAN) 25 MG tablet Take 1 tablet (25 mg total) by mouth every 8 (eight) hours as needed for nausea or vomiting. 01/06/20   Cammie Sickle, MD  sertraline (ZOLOFT) 25 MG tablet Take 1 tablet (25 mg total) by mouth daily. 09/29/19   Loletha Grayer, MD  triamcinolone ointment (KENALOG) 0.5 % Apply 1 application topically 2 (two) times daily. Patient taking differently: Apply 1 application topically 2 (two) times daily as needed (itching).  11/20/19   Jacquelin Hawking, NP  VENTOLIN HFA 108 (90 Base) MCG/ACT inhaler Inhale 1-2 puffs into the lungs every 4 (four) hours as  needed for shortness of breath. 02/19/19   Verlon Au, NP    Allergies Ketamine  Family History  Problem Relation Age of Onset  . Hypertension Mother   . Hypertension Father   . Diabetes Father   . Hyperlipidemia Father   . Alcohol abuse Daughter   . Hypertension Maternal Grandmother   . Diabetes Maternal Grandmother   . Diabetes Paternal Grandmother   . Hypertension Paternal Grandmother     Social History Social History   Tobacco Use  . Smoking status: Former Smoker    Packs/day: 0.25    Years: 10.00    Pack years: 2.50    Types: Cigarettes    Quit date: 10/16/2006    Years since quitting: 13.2  . Smokeless tobacco: Never Used  Substance Use Topics  . Alcohol use: Not Currently    Comment: seldom  . Drug use: Yes    Types: Marijuana    Comment: not very often    Review of Systems Constitutional: No fever/chills Eyes: No visual changes. ENT: No sore throat. Cardiovascular: Denies chest pain. Respiratory: Denies shortness of breath. Gastrointestinal: See HPI Genitourinary: Negative for dysuria. Musculoskeletal: Negative for back pain. Skin: Negative for rash. Neurological: Negative for headaches, areas of focal weakness or numbness.    ____________________________________________   PHYSICAL EXAM:  VITAL SIGNS: ED Triage Vitals  Enc Vitals Group     BP 01/14/20 1013 138/80     Pulse Rate 01/14/20 1013 (!) 101     Resp 01/14/20 1012 20     Temp 01/14/20 1017 98.6 F (37 C)     Temp Source 01/14/20 1012 Oral     SpO2 01/14/20 1012 99 %     Weight 01/14/20 1013 162 lb (73.5 kg)     Height 01/14/20 1013 5' 7"  (1.702 m)     Head Circumference --      Peak Flow --      Pain Score 01/14/20 1013 9     Pain Loc --      Pain Edu? --      Excl. in [place]? --     Constitutional: Alert and oriented.  Appears  in pain, having sort of waves of distress she reports pain of the right lower abdomen Eyes: Conjunctivae are normal. Head: Atraumatic. Nose: No  congestion/rhinnorhea. Mouth/Throat: Mucous membranes are moist. Neck: No stridor.  Cardiovascular: Normal rate, regular rhythm. Grossly normal heart sounds.  Good peripheral circulation. Respiratory: Normal respiratory effort.  No retractions. Lungs CTAB. Gastrointestinal: Soft and nontender over the left minimal discomfort right upper quadrant but notes moderate pain to palpation without rebound or guarding over the right flank and right lower quadrant. No distention. Musculoskeletal: No lower extremity tenderness mild edema in the right lower extremity above the knee down.  Patient reports chronic due to her knee surgery.  Reports pain to palpation over the right buttock and gluteal region, but no lesions are noted no swelling or hematoma noted.  Denies pain specific to the right greater trochanter or trochanteric region.  Able to range the hip and it does not seem to elicit the pain Neurologic:  Normal speech and language. No gross focal neurologic deficits are appreciated.  Skin:  Skin is warm, dry and intact. No rash noted. Psychiatric: Mood and affect are normal. Speech and behavior are normal.  ____________________________________________   LABS (all labs ordered are listed, but only abnormal results are displayed)  Labs Reviewed  CBC - Abnormal; Notable for the following components:      Result Value   RBC 3.53 (*)    Hemoglobin 11.0 (*)    HCT 34.7 (*)    All other components within normal limits  COMPREHENSIVE METABOLIC PANEL - Abnormal; Notable for the following components:   Glucose, Bld 114 (*)    Albumin 3.4 (*)    Alkaline Phosphatase 194 (*)    All other components within normal limits  LIPASE, BLOOD - Abnormal; Notable for the following components:   Lipase 59 (*)    All other components within normal limits  URINALYSIS, COMPLETE (UACMP) WITH MICROSCOPIC - Abnormal; Notable for the following components:   Color, Urine YELLOW (*)    APPearance CLEAR (*)    Specific  Gravity, Urine >1.046 (*)    Ketones, ur 5 (*)    Leukocytes,Ua TRACE (*)    All other components within normal limits   ____________________________________________  EKG  Reviewed inter by me at 1020 Heart rate 100 QRS 80 QTc 450 Minimal sinus tachycardia, no evidence of acute ischemia ____________________________________________  RADIOLOGY  CT ABDOMEN PELVIS W CONTRAST  Result Date: 01/14/2020 CLINICAL DATA:  Right-sided abdominal and pelvic pain. Right lower quadrant and low back pain. EXAM: CT ABDOMEN AND PELVIS WITH CONTRAST TECHNIQUE: Multidetector CT imaging of the abdomen and pelvis was performed using the standard protocol following bolus administration of intravenous contrast. CONTRAST:  129m OMNIPAQUE IOHEXOL 300 MG/ML  SOLN COMPARISON:  12/28/2019 FINDINGS: Lower chest: Numerous nodules again identified in the lung bases, some of which are cavitary. No substantial change compared to the study from 2 weeks ago. Hepatobiliary: No suspicious focal abnormality within the liver parenchyma. Mild intrahepatic biliary duct prominence is stable. Extrahepatic biliary dilatation is stable. Common bile duct measures 13 mm in the head of pancreas. Pancreas: No focal mass lesion. No dilatation of the main duct. No intraparenchymal cyst. No peripancreatic edema. Spleen: No splenomegaly. No focal mass lesion. Adrenals/Urinary Tract: No adrenal nodule or mass. Right kidney unremarkable. Moderate left hydroureteronephrosis is stable since prior with ureteral dilatation extending down into the lower pelvis. Distal ureter not well demonstrated on axial imaging, but coronal imaging suggests enhancing soft tissue in the  distal left ureter (coronal image 51/series 5) just proximal to the UVJ. Mild left-sided bladder wall thickening is best appreciated on coronal imaging. Stomach/Bowel: Stomach is unremarkable. No gastric wall thickening. No evidence of outlet obstruction. Duodenum is normally positioned as  is the ligament of Treitz. No small bowel wall thickening. No small bowel dilatation. Status post right hemicolectomy transverse colon is similar to prior, mildly distended and fluid-filled with abrupt transition to decompressed colon with wall thickening in the region of the distal transverse segment/splenic flexure. Left colon remains decompressed down to the rectum. Vascular/Lymphatic: There is abdominal aortic atherosclerosis without aneurysm. IVC filter visualized in situ. There is no gastrohepatic or hepatoduodenal ligament lymphadenopathy. No retroperitoneal or mesenteric lymphadenopathy. No pelvic sidewall lymphadenopathy. Reproductive: Similar appearance of uterus and cervix, remaining ill-defined. Other: No intraperitoneal free fluid. Stranding in the lower pelvis likely treatment related. This is stable. Musculoskeletal: Probable sacral insufficiency fractures with stable sclerosis in the anterior left pubic bone. IMPRESSION: 1. No substantial change in exam. 2. Persistent moderate left hydroureteronephrosis with apparent enhancing soft tissue at the distal left ureter just proximal to the UVJ. Urothelial neoplasm or involvement by known cervical cancer not excluded. 3. Similar appearance of numerous pulmonary nodules in the lung bases, some of which are cavitary. Imaging features remain compatible with metastatic disease. 4. Left colon remains decompressed which may account for the apparent circumferential wall thickening. Infectious/inflammatory colitis cannot be excluded. 5. Similar appearance of ill-defined uterus and cervix. 6. Aortic Atherosclerosis (ICD10-I70.0). Electronically Signed   By: Misty Stanley M.D.   On: 01/14/2020 12:13   US Venous Img Lower Unilateral Right  Result Date: 01/14/2020 CLINICAL DATA:  Right lower extremity pain and edema. History of prior DVT. Right knee arthroplasty earlier this month. EXAM: RIGHT LOWER EXTREMITY VENOUS DOPPLER ULTRASOUND TECHNIQUE: Gray-scale  sonography with graded compression, as well as color Doppler and duplex ultrasound were performed to evaluate the lower extremity deep venous systems from the level of the common femoral vein and including the common femoral, femoral, profunda femoral, popliteal and calf veins including the posterior tibial, peroneal and gastrocnemius veins when visible. The superficial great saphenous vein was also interrogated. Spectral Doppler was utilized to evaluate flow at rest and with distal augmentation maneuvers in the common femoral, femoral and popliteal veins. COMPARISON:  None. FINDINGS: Contralateral Common Femoral Vein: Respiratory phasicity is normal and symmetric with the symptomatic side. No evidence of thrombus. Normal compressibility. Common Femoral Vein: No evidence of thrombus. Normal compressibility, respiratory phasicity and response to augmentation. Saphenofemoral Junction: No evidence of thrombus. Normal compressibility and flow on color Doppler imaging. Profunda Femoral Vein: No evidence of thrombus. Normal compressibility and flow on color Doppler imaging. Femoral Vein: No evidence of thrombus. Normal compressibility, respiratory phasicity and response to augmentation. Popliteal Vein: No evidence of thrombus. Normal compressibility, respiratory phasicity and response to augmentation. Calf Veins: No evidence of thrombus. Normal compressibility and flow on color Doppler imaging. Superficial Great Saphenous Vein: No evidence of thrombus. Normal compressibility. Venous Reflux:  None. Other Findings: No evidence of superficial thrombophlebitis. Small complex fluid collection in the right popliteal fossa measuring just over 2 cm likely relates to the recent knee surgery. IMPRESSION: No evidence of right lower extremity deep venous thrombosis. Electronically Signed   By: Aletta Edouard M.D.   On: 01/14/2020 11:24     CT scans reviewed, overall does not appear to be any acute changes to account for the  patient's right sided abdominal pain. ____________________________________________   PROCEDURES  Procedure(s) performed: None  .1-3 Lead EKG Interpretation Performed by: Delman Kitten, MD Authorized by: Delman Kitten, MD     Interpretation: normal     ECG rate:  90   ECG rate assessment: normal     Rhythm: sinus rhythm     Ectopy: none     Conduction: normal      Critical Care performed: No  ____________________________________________   INITIAL IMPRESSION / ASSESSMENT AND PLAN / ED COURSE  Pertinent labs & imaging results that were available during my care of the patient were reviewed by me and considered in my medical decision making (see chart for details).   Differential diagnosis includes but is not limited to, abdominal perforation, aortic dissection, cholecystitis, appendicitis, diverticulitis, colitis, esophagitis/gastritis, kidney stone, pyelonephritis, urinary tract infection, aortic aneurysm. All are considered in decision and treatment plan. Based upon the patient's presentation and risk factors, and her recent hospital course and IVC filter placement we will proceed with imaging studies of the abdomen pelvis.  Also right extremity DVT study for the leg.  Knee appears to be healing well.  Clean dry intact incisions.   Clinical Course as of Jan 14 1351  Wed Jan 14, 2020  1235 Patient reports she did make a statement that she would just "shoot herself" because how much pain she has.  However she tells me that she would never actually do this.  She denies being suicidal, reports it was a term that she used because how much pain she was experiencing.  I have however place consult to psychiatry, and the patient is willing to speak to them. Voluntary at this time   [MQ]  1313 Case discussed with psychiatry, he advised the patient may be discharged.  Does not require psychiatric admission   [MQ]    Clinical Course User Index [MQ] Delman Kitten, MD    ----------------------------------------- 1:51 PM on 01/14/2020 -----------------------------------------  Work-up to this point negative for acute etiology.  Discussed with patient and will discharge her.  Pain is improved at this time, and I have contacted our palliative team and Vonna Kotyk Borders who is her palliative provider will be reaching out to her this afternoon to discuss further options for her follow-up and pain management.  Patient understanding agreeable with plan    ____________________________________________   FINAL CLINICAL IMPRESSION(S) / ED DIAGNOSES  Final diagnoses:  Right lateral abdominal pain        Note:  This document was prepared using Dragon voice recognition software and may include unintentional dictation errors       Delman Kitten, MD 01/14/20 1352

## 2020-01-14 NOTE — ED Notes (Signed)
Pt returned from CT at this time, c/o continued pain 8/10.

## 2020-01-14 NOTE — ED Notes (Signed)
Pt repositioned in bed at this time, pillow provided for patient's knee. Pt remains A&O x4, VSS. Call bell within reach of patient at this time, pt provided with remote to TV at this time. Pt denies further needs. Lights dimmed for patient comfort.

## 2020-01-14 NOTE — Progress Notes (Signed)
Error   This encounter was created in error - please disregard. 

## 2020-01-14 NOTE — Telephone Encounter (Signed)
Spoke to patient via telephone. She agreed to come in for labs/symptom management visit/fluids this morning.

## 2020-01-14 NOTE — ED Triage Notes (Signed)
Pt sent over from the [facility] with c/o RLQ and lower back pain , pt had right TKR 3/10 was discharged and then admitted again a couple days later with a blood clot in the right leg and stent placement. Pt is c/o severe right lower abd pain and right lower back pain into that leg. Pt is tearful. RN that brought in the pt from the ca center informed this nurse that the pt reports having SI with access to a gun.

## 2020-01-14 NOTE — Progress Notes (Signed)
Patient was seen today in the ER.  I spoke with patient by phone.  She was tearful as she described feeling emotionally overwhelmed with her current situation.  She recognizes that cancer treatment has been held due to her overall poor performance status and she does not know if she will be able to again tolerate receiving chemo in the future.  She endorses a high emotional pain burden that is likely contributing to physical exacerbation of her pain.  We did discuss adjustment of her oxycodone if needed for breakthrough pain.  Patient can take oxycodone 1 to 1-1/2 tablets of every 3 hours as needed.  Patient says that she would like to explore options for short-term facility placement where she may receive rehab.  Unclear if this will be an option without a new inpatient stay.  Patient says that she does have some financial means to her parents to pay for a facility if needed.  ALF for respite might be an option with home health involvement for PT/OT.  Will consult social work at home.  Case and plan discussed with Dr. Rogue Bussing.

## 2020-01-14 NOTE — ED Notes (Signed)
Explained to patient UA needed, patient placed on bedpan by this RN at this time.

## 2020-01-14 NOTE — Telephone Encounter (Signed)
Patient called reoprting 2 days of vomiting and diarrhea and can't keep anything down. She wants to come in for IV fluids. Please advise

## 2020-01-14 NOTE — Telephone Encounter (Signed)
Okay I will see her.   Thanks

## 2020-01-14 NOTE — ED Notes (Signed)
NAD noted at time of D/C. Pt taken to lobby via wheelchair by this RN. Pt denies comments/concerns regarding D/C instructions. Pt states she wants to wait for her sister to arrive while she is in the lobby. Pt instructed to flag staff member down when her sister arrives. Pt states understanding.

## 2020-01-14 NOTE — ED Notes (Signed)
This RN and EDP at bedside and addressed concerns regarding possible SI with access to gun. Pt denies intentions of harming herself, states multiple reasons for living, states that she sometimes has thoughts due to her constant pain and "everyday it's just something new". Pt states multiple stressors, however also states multiple reasons to live at this time.

## 2020-01-14 NOTE — ED Notes (Signed)
Pt given ice chips by this RN.

## 2020-01-14 NOTE — Discharge Instructions (Signed)
Palliative care should be reaching out to you to further discuss her pain management either today or tomorrow.  Likely they will be able to accommodate this is a virtual visit.   Please return to the emergency room right away if you are to develop a fever, severe nausea, your pain becomes severe or worsens, you are unable to keep food down, begin vomiting any dark or bloody fluid, you develop any dark or bloody stools, feel dehydrated, or other new concerns or symptoms arise.

## 2020-01-14 NOTE — Progress Notes (Signed)
Patient came into the [facility] today to be evaluated in the symptom management clinic for nausea with vomiting and diarrhea. When she arrived at the front desk, she was crying and complaining of terrible pain in her right hip and upper leg. She stated that the pain began last night when she got up to go the bathroom. Pain was a 10(10) and patient was crying uncontrollably. Patient was agreeable to go to the ED and was taken immediately over via wheelchair. On the way to the ED, patient stated that she was extremely depressed and verbalized suicidal ideation. This was reported to the ED nurse on handoff.

## 2020-01-14 NOTE — ED Notes (Signed)
Dr. Jerl Santos at bedside at this time to assess patient.

## 2020-01-15 ENCOUNTER — Telehealth: Payer: Self-pay

## 2020-01-15 ENCOUNTER — Telehealth: Payer: Self-pay | Admitting: Hospice and Palliative Medicine

## 2020-01-15 ENCOUNTER — Telehealth: Payer: Self-pay | Admitting: Primary Care

## 2020-01-15 ENCOUNTER — Other Ambulatory Visit: Payer: Medicaid Other

## 2020-01-15 ENCOUNTER — Ambulatory Visit: Payer: Medicaid Other | Admitting: Physician Assistant

## 2020-01-15 NOTE — Telephone Encounter (Signed)
[name] spoke with patient. She had already contacted [place] the social worker to r/s her apt today. No further intervention needed at this time.

## 2020-01-15 NOTE — Telephone Encounter (Signed)
[name], Pt would like you to call her ASAP. She needs to speak with you about a appt she has scheduled today.

## 2020-01-15 NOTE — Telephone Encounter (Signed)
SW received call from patient requesting to reschedule appointment from today to Tuesday, 4/6 at 1:00PM.

## 2020-01-16 ENCOUNTER — Telehealth: Payer: Self-pay | Admitting: *Deleted

## 2020-01-16 ENCOUNTER — Inpatient Hospital Stay: Payer: Medicaid Other | Attending: Hospice and Palliative Medicine | Admitting: Hospice and Palliative Medicine

## 2020-01-16 ENCOUNTER — Encounter: Payer: Self-pay | Admitting: Internal Medicine

## 2020-01-16 DIAGNOSIS — G629 Polyneuropathy, unspecified: Secondary | ICD-10-CM | POA: Insufficient documentation

## 2020-01-16 DIAGNOSIS — E2740 Unspecified adrenocortical insufficiency: Secondary | ICD-10-CM | POA: Insufficient documentation

## 2020-01-16 DIAGNOSIS — Z8616 Personal history of COVID-19: Secondary | ICD-10-CM | POA: Insufficient documentation

## 2020-01-16 DIAGNOSIS — C7801 Secondary malignant neoplasm of right lung: Secondary | ICD-10-CM | POA: Insufficient documentation

## 2020-01-16 DIAGNOSIS — Z9221 Personal history of antineoplastic chemotherapy: Secondary | ICD-10-CM | POA: Insufficient documentation

## 2020-01-16 DIAGNOSIS — Z79899 Other long term (current) drug therapy: Secondary | ICD-10-CM | POA: Insufficient documentation

## 2020-01-16 DIAGNOSIS — Z90721 Acquired absence of ovaries, unilateral: Secondary | ICD-10-CM | POA: Insufficient documentation

## 2020-01-16 DIAGNOSIS — Z87891 Personal history of nicotine dependence: Secondary | ICD-10-CM | POA: Insufficient documentation

## 2020-01-16 DIAGNOSIS — E876 Hypokalemia: Secondary | ICD-10-CM | POA: Insufficient documentation

## 2020-01-16 DIAGNOSIS — C7802 Secondary malignant neoplasm of left lung: Secondary | ICD-10-CM | POA: Insufficient documentation

## 2020-01-16 DIAGNOSIS — Z7901 Long term (current) use of anticoagulants: Secondary | ICD-10-CM | POA: Insufficient documentation

## 2020-01-16 DIAGNOSIS — Z515 Encounter for palliative care: Secondary | ICD-10-CM

## 2020-01-16 DIAGNOSIS — Z86718 Personal history of other venous thrombosis and embolism: Secondary | ICD-10-CM | POA: Insufficient documentation

## 2020-01-16 DIAGNOSIS — G893 Neoplasm related pain (acute) (chronic): Secondary | ICD-10-CM | POA: Insufficient documentation

## 2020-01-16 DIAGNOSIS — C539 Malignant neoplasm of cervix uteri, unspecified: Secondary | ICD-10-CM | POA: Insufficient documentation

## 2020-01-16 MED ORDER — OXYCODONE HCL 15 MG PO TABS: 15.0000 mg | ORAL_TABLET | ORAL | 0 refills | Status: DC | PRN

## 2020-01-16 NOTE — Addendum Note (Signed)
Addended by: Irean Hong on: 01/16/2020 02:38 PM   Modules accepted: Orders

## 2020-01-16 NOTE — Telephone Encounter (Signed)
Patient unable to get her narcotics. [facility] does not have her oxycodone 15 mg. I confirmed this with the pharmacy. Reviewed chart - script did not fwd electronically. Spoke - with Merrily Pew and he will resubmit script

## 2020-01-16 NOTE — Progress Notes (Signed)
Was unable to complete virtual visit.  I called and spoke with patient by phone.  She says that she is trying to get ready to spend time with her daughter for Easter.  Daughter is visiting out of town.  Will reschedule visit with me for another day.

## 2020-01-19 ENCOUNTER — Ambulatory Visit: Payer: Medicaid Other | Admitting: Physician Assistant

## 2020-01-20 ENCOUNTER — Other Ambulatory Visit: Payer: Self-pay

## 2020-01-20 ENCOUNTER — Other Ambulatory Visit: Payer: Medicaid Other

## 2020-01-20 DIAGNOSIS — Z515 Encounter for palliative care: Secondary | ICD-10-CM

## 2020-01-20 NOTE — Progress Notes (Signed)
COMMUNITY PALLIATIVE CARE SW NOTE  PATIENT NAME: Joanna Hall DOB: 12-23-1965 MRN: [id number]  PRIMARY CARE PROVIDER: Towanda Malkin, MD  RESPONSIBLE PARTY:  Acct ID - Guarantor Home Phone Work Phone Relationship Acct Type  [id number] MYKAH, [phone]  Self P/F     [address], [address], [place] [id number]     PLAN OF CARE and INTERVENTIONS:             1. GOALS OF CARE/ ADVANCE CARE PLANNING:  Patient is DNR, form is in the home. Long discussion regarding goals. Patient is hopeful that she can remain at home as long as possible. 2. SOCIAL/EMOTIONAL/SPIRITUAL ASSESSMENT/ INTERVENTIONS:  SW met with patient in the home. Patient's granddaughter was present but not involved with visit. Patient discussed multiple hospitalizations and ED visits. Patient said she had a right knee replacement on 3/10. Patient reports pain in her knee and hip on the right side. Patient said her appetite is poor. Patient is sleeping well, feels that she is sleeping more. Patient lives with her parents. Patient has one sister in [place] and they have a good relationship. Patient has two daughters, one died in 2017/12/18. Patient also has five granddaughter. Discussed patient's childhood and memories with family. Discussed patient's life and her time with cancer and treatment. SW discussed hospice versus palliative care, provided emotional support, validated patient's concerns, reviewed goals and used active and reflective listening. 3. PATIENT/CAREGIVER EDUCATION/ COPING:  Patient is alert, engaged. Patient is confused at times. Patient openly expressed feelings, was tearful discussing her prognosis and limited options. Patient enjoys time with family. Family is supportive. 4. PERSONAL EMERGENCY PLAN:  Family will call 9-1-1 for emergencies. 5. COMMUNITY RESOURCES COORDINATION/ HEALTH CARE NAVIGATION:  Patient is managing her care. Currently receiving home health PT with [facility], last visit  scheduled for 4/14. Patient is scheduled for a telehealth visit with Billey Chang, NP tomorrow. Patient is scheduled to go back on Friday to cancer center. Patient has PCP visit on 4/13 and palliative care NP on 4/15. SW to contact [name] to review visit.  6. FINANCIAL/LEGAL CONCERNS/INTERVENTIONS:  Patient receives SSDI. Patient has Medicaid.      SOCIAL HX:  Social History   Tobacco Use  . Smoking status: Former Smoker    Packs/day: 0.25    Years: 10.00    Pack years: 2.50    Types: Cigarettes    Quit date: 10/16/2006    Years since quitting: 13.2  . Smokeless tobacco: Never Used  Substance Use Topics  . Alcohol use: Not Currently    Comment: seldom    CODE STATUS: DNR ADVANCED DIRECTIVES: N MOST FORM COMPLETE:  No. HOSPICE EDUCATION PROVIDED: Yes.  PPS: Patient is mostly independent of ADLs, but needs standby for safety. Patient ambulates slowly with a walker.  I spent 45 minutes with patient/family, from 1:30-2:15p providing education, support and consultation.   Margaretmary Lombard, LCSW

## 2020-01-21 ENCOUNTER — Inpatient Hospital Stay (HOSPITAL_BASED_OUTPATIENT_CLINIC_OR_DEPARTMENT_OTHER): Payer: Medicaid Other | Admitting: Hospice and Palliative Medicine

## 2020-01-21 DIAGNOSIS — R1030 Lower abdominal pain, unspecified: Secondary | ICD-10-CM

## 2020-01-21 MED ORDER — OXYCODONE HCL ER 30 MG PO T12A: 30.0000 mg | EXTENDED_RELEASE_TABLET | Freq: Three times a day (TID) | ORAL | 0 refills | Status: DC

## 2020-01-21 NOTE — Progress Notes (Signed)
Virtual Visit via Telephone Note  I connected with Joanna Hall on 01/21/20 at  1:00 PM EDT by telephone and verified that I am speaking with the correct person using two identifiers.   I discussed the limitations, risks, security and privacy concerns of performing an evaluation and management service by telephone and the availability of in person appointments. I also discussed with the patient that there may be a patient responsible charge related to this service. The patient expressed understanding and agreed to proceed.   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 Patient has multiple recent hospitalizations and ER visits for nausea and abdominal pain.    Observations/Objective: I called and spoke with patient by phone.  She reports slow functional improvement.  She saw Ortho yesterday and had a tighter compression sleeve ordered due to swelling.  Patient continues to receive PT at home.  Patient reports that she has some nausea and vomiting earlier today.  Sounds like that that is improved this afternoon with use of antiemetics.  I did offer her an [facility] visit tomorrow for fluids if needed.  Otherwise, encouraged her to push oral intake as tolerated.  Pain is stable on current regimen.  Will refill OxyContin.  PDMP reviewed.  She says she is decided not to pursue rehab or placement and prefers to stay at home.  I appreciate the support of social work.  I spoke yesterday with [name].  Patient will also be followed by palliative NP in the home.  Patient did bring up the subject of future hospice involvement, which we have previously discussed in the past.  However, she does hope that she will be able to resume  treatments at some point in the future.  Assessment and Plan: Stage IV cervical cancer -on Alimta but treatment currently held s/p TKR  Neoplasm related pain -stable on oxycodone/OxyContin. Refill OxyContin (#45). Oxycodone was refilled on 4/3. PDMP reviewed.   Follow Up Instructions: Follow-up virtual visit in 1 month   I discussed the assessment and treatment plan with the patient. The patient was provided an opportunity to ask questions and all were answered. The patient agreed with the plan and demonstrated an understanding of the instructions.   The patient was advised to call back or seek an in-person evaluation if the symptoms worsen or if the condition fails to improve as anticipated.  I provided 15 minutes of non-face-to-face time during this encounter.   Irean Hong, NP

## 2020-01-22 ENCOUNTER — Other Ambulatory Visit: Payer: Self-pay | Admitting: Oncology

## 2020-01-23 ENCOUNTER — Inpatient Hospital Stay: Payer: Medicaid Other

## 2020-01-23 ENCOUNTER — Other Ambulatory Visit: Payer: Self-pay

## 2020-01-23 ENCOUNTER — Inpatient Hospital Stay (HOSPITAL_BASED_OUTPATIENT_CLINIC_OR_DEPARTMENT_OTHER): Payer: Medicaid Other | Admitting: Internal Medicine

## 2020-01-23 DIAGNOSIS — Z8616 Personal history of COVID-19: Secondary | ICD-10-CM | POA: Diagnosis not present

## 2020-01-23 DIAGNOSIS — E876 Hypokalemia: Secondary | ICD-10-CM | POA: Diagnosis not present

## 2020-01-23 DIAGNOSIS — R112 Nausea with vomiting, unspecified: Secondary | ICD-10-CM

## 2020-01-23 DIAGNOSIS — E2740 Unspecified adrenocortical insufficiency: Secondary | ICD-10-CM | POA: Diagnosis not present

## 2020-01-23 DIAGNOSIS — Z95828 Presence of other vascular implants and grafts: Secondary | ICD-10-CM

## 2020-01-23 DIAGNOSIS — C539 Malignant neoplasm of cervix uteri, unspecified: Secondary | ICD-10-CM | POA: Diagnosis present

## 2020-01-23 DIAGNOSIS — E860 Dehydration: Secondary | ICD-10-CM

## 2020-01-23 DIAGNOSIS — Z87891 Personal history of nicotine dependence: Secondary | ICD-10-CM | POA: Diagnosis not present

## 2020-01-23 DIAGNOSIS — C538 Malignant neoplasm of overlapping sites of cervix uteri: Secondary | ICD-10-CM | POA: Diagnosis not present

## 2020-01-23 DIAGNOSIS — G629 Polyneuropathy, unspecified: Secondary | ICD-10-CM | POA: Diagnosis not present

## 2020-01-23 DIAGNOSIS — Z9221 Personal history of antineoplastic chemotherapy: Secondary | ICD-10-CM | POA: Diagnosis not present

## 2020-01-23 DIAGNOSIS — C7801 Secondary malignant neoplasm of right lung: Secondary | ICD-10-CM | POA: Diagnosis not present

## 2020-01-23 DIAGNOSIS — Z79899 Other long term (current) drug therapy: Secondary | ICD-10-CM | POA: Diagnosis not present

## 2020-01-23 DIAGNOSIS — Z7901 Long term (current) use of anticoagulants: Secondary | ICD-10-CM | POA: Diagnosis not present

## 2020-01-23 DIAGNOSIS — C7802 Secondary malignant neoplasm of left lung: Secondary | ICD-10-CM | POA: Diagnosis not present

## 2020-01-23 DIAGNOSIS — Z90721 Acquired absence of ovaries, unilateral: Secondary | ICD-10-CM | POA: Diagnosis not present

## 2020-01-23 DIAGNOSIS — R197 Diarrhea, unspecified: Secondary | ICD-10-CM

## 2020-01-23 DIAGNOSIS — Z86718 Personal history of other venous thrombosis and embolism: Secondary | ICD-10-CM | POA: Diagnosis not present

## 2020-01-23 DIAGNOSIS — G893 Neoplasm related pain (acute) (chronic): Secondary | ICD-10-CM | POA: Diagnosis not present

## 2020-01-23 LAB — CBC WITH DIFFERENTIAL/PLATELET
Abs Immature Granulocytes: 0.02 10*3/uL (ref 0.00–0.07)
Basophils Absolute: 0 10*3/uL (ref 0.0–0.1)
Basophils Relative: 0 %
Eosinophils Absolute: 0 10*3/uL (ref 0.0–0.5)
Eosinophils Relative: 1 %
HCT: 36.8 % (ref 36.0–46.0)
Hemoglobin: 11.5 g/dL — ABNORMAL LOW (ref 12.0–15.0)
Immature Granulocytes: 1 %
Lymphocytes Relative: 14 %
Lymphs Abs: 0.5 10*3/uL — ABNORMAL LOW (ref 0.7–4.0)
MCH: 30.7 pg (ref 26.0–34.0)
MCHC: 31.3 g/dL (ref 30.0–36.0)
MCV: 98.1 fL (ref 80.0–100.0)
Monocytes Absolute: 0.2 10*3/uL (ref 0.1–1.0)
Monocytes Relative: 6 %
Neutro Abs: 3.1 10*3/uL (ref 1.7–7.7)
Neutrophils Relative %: 78 %
Platelets: 207 10*3/uL (ref 150–400)
RBC: 3.75 MIL/uL — ABNORMAL LOW (ref 3.87–5.11)
RDW: 13.5 % (ref 11.5–15.5)
WBC: 3.9 10*3/uL — ABNORMAL LOW (ref 4.0–10.5)
nRBC: 0 % (ref 0.0–0.2)

## 2020-01-23 LAB — COMPREHENSIVE METABOLIC PANEL
ALT: 11 U/L (ref 0–44)
AST: 24 U/L (ref 15–41)
Albumin: 3.5 g/dL (ref 3.5–5.0)
Alkaline Phosphatase: 173 U/L — ABNORMAL HIGH (ref 38–126)
Anion gap: 12 (ref 5–15)
BUN: 9 mg/dL (ref 6–20)
CO2: 26 mmol/L (ref 22–32)
Calcium: 9.1 mg/dL (ref 8.9–10.3)
Chloride: 99 mmol/L (ref 98–111)
Creatinine, Ser: 0.59 mg/dL (ref 0.44–1.00)
GFR calc Af Amer: >60 mL/min (ref >60)
GFR calc non Af Amer: >60 mL/min (ref >60)
Glucose, Bld: 107 mg/dL — ABNORMAL HIGH (ref 70–99)
Potassium: 3.5 mmol/L (ref 3.5–5.1)
Sodium: 137 mmol/L (ref 135–145)
Total Bilirubin: 0.7 mg/dL (ref 0.3–1.2)
Total Protein: 8 g/dL (ref 6.5–8.1)

## 2020-01-23 LAB — MAGNESIUM: Magnesium: 1.5 mg/dL — ABNORMAL LOW (ref 1.7–2.4)

## 2020-01-23 MED ORDER — SODIUM CHLORIDE 0.9% FLUSH
10.0000 mL | Freq: Once | INTRAVENOUS | Status: AC
  Administered 2020-01-23: 10 mL via INTRAVENOUS
  Filled 2020-01-23: qty 10

## 2020-01-23 MED ORDER — MORPHINE SULFATE 2 MG/ML IJ SOLN: 4.0000 mg | Freq: Once | INTRAMUSCULAR | Status: DC

## 2020-01-23 MED ORDER — MORPHINE SULFATE 2 MG/ML IJ SOLN
4.0000 mg | Freq: Once | INTRAMUSCULAR | Status: AC
  Administered 2020-01-23: 15:00:00 4 mg via INTRAVENOUS
  Filled 2020-01-23: qty 2

## 2020-01-23 MED ORDER — MAGNESIUM SULFATE 4 GM/100ML IV SOLN
4.0000 g | Freq: Once | INTRAVENOUS | Status: AC
  Administered 2020-01-23: 4 g via INTRAVENOUS
  Filled 2020-01-23: qty 100

## 2020-01-23 MED ORDER — ONDANSETRON HCL 4 MG/2ML IJ SOLN
8.0000 mg | Freq: Once | INTRAMUSCULAR | Status: AC
  Administered 2020-01-23: 8 mg via INTRAVENOUS
  Filled 2020-01-23: qty 4

## 2020-01-23 MED ORDER — HEPARIN SOD (PORK) LOCK FLUSH 100 UNIT/ML IV SOLN
500.0000 [IU] | Freq: Once | INTRAVENOUS | Status: AC | PRN
  Administered 2020-01-23: 17:00:00 500 [IU]
  Filled 2020-01-23: qty 5

## 2020-01-23 MED ORDER — SODIUM CHLORIDE 0.9 % IV SOLN: Freq: Once | INTRAVENOUS | Status: DC

## 2020-01-23 MED ORDER — SODIUM CHLORIDE 0.9 % IV SOLN
Freq: Once | INTRAVENOUS | Status: AC
  Filled 2020-01-23: qty 250

## 2020-01-23 MED ORDER — HEPARIN SOD (PORK) LOCK FLUSH 100 UNIT/ML IV SOLN
INTRAVENOUS | Status: AC
  Filled 2020-01-23: qty 5

## 2020-01-23 NOTE — Assessment & Plan Note (Addendum)
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  #CT imaging March 2021-progressive lung nodules/hydronephrosis worsening likely secondary malignancy.  However team on hold because of acute pain right knee hematoma.  #A long discussion the patient regarding the difficulty in offering chemotherapy given in general poor tolerance of therapy/especially the context of her acute pain/hematoma of the right knee.  Patient understands that any further complication of chemotherapy would put her back right in the hospital.  She understands that she might not be any further candidate for chemotherapy.  Patient very tearful-see below  #Right knee pain swelling from hematoma post surgery-not well controlled.  Continue pain management as per palliative care.  #History of PE DVT factor V Leiden heterozygosity-anticoagulation is on hold because of right knee hematoma.  Patient s/p IVC filter placement.  Continue to hold anticoagulation.  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  Clinically stable.  # Adrenal insufficiency-on hydrocortisone 20 mg/day; hydrocortisone in PM.  Stable  # Hypomagnesemia-secondary platinum chemotherapy-mag 1.4-stable mag infusion today.  #Chronic right lower quadrant abdominal pain/scar tissue versus others-poorly controlled- on oxycodone; poorly controlled given the worsening arthritic pain control.   #Given overall poor prognosis-hospice is reasonable.  Patient states that she called hospice herself.  Awaiting meeting with hospice.  # DISPOSITION:  # Mag IV infusions today; zofran; IV morphine  #  Follow up in 2 weeks-MD; labs-cbc/cmp/mag; ; MD; no chemo; possible mag IV 2 hour. Dr.B

## 2020-01-23 NOTE — Progress Notes (Signed)
[place] NOTE  Patient Care Team: Towanda Malkin, MD as PCP - General (Internal Medicine) Mellody Drown, MD as Consulting Physician (Obstetrics and Gynecology) [name], MD as Consulting Physician (Gastroenterology) Michael Boston, MD as Consulting Physician (General Surgery) Lovell Sheehan, MD as Consulting Physician (Orthopedic Surgery) Cammie Sickle, MD as Consulting Physician (Oncology) Earnestine Leys, MD as Consulting Physician (Orthopedic Surgery)  CHIEF COMPLAINTS/PURPOSE OF CONSULTATION: Cervical cancer  #  Oncology History Overview Note  # dec 2017- CERVICAL ADENO CA [[place]]; Stage IB [Dr. Christene Slates at [facility] in [place], [place];No surgery- Chemo-RT;   # 2018-sep - lung nodules [in [place]]  # AUG 20th, 2019-#1 Carbo-Taxol s/p 6 cycles- [No avastin sec to blood clots]: Feb 2020-bilateral lung nodules with 1 cm in size. March 2020- finished carbo-taxol #7; poor tolerance to [place].  # April 15 th 2020- Start Taxol weekly x 3; one week OFF;  # July 2020-CT- bil cavitary lesions- slightly bigger by few mm/overall STABLE; CT a/p-NED. 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  She received treatment from 11/2016-05/2017.  01/2017 cisplatin x 2 and carboplatin x 1 (01/29/2017) due to ARF and XRT.  XRT was followed by T & O on 02/01/2017 and T & N 02/10/2017 and 02/20/2017.  Course was  complicated by 80 pound weight loss, nausea, vomiting, electrolyte wasting (potassium and magnesium).  She describes that.  Is been sick constantly requiring at least 20 hospitalizations.  Follow-up CT chest and PET on 06/2017. Per patient, 'radiation worked' and no disease in the abdomen.  At that time she was noted to have lung nodules that were growing and follow-up imaging was scheduled for 10/2017.  She was admitted to hospital in [place] for small bowel obstruction which was managed conservatively and she was home for a week prior to traveling to [place] for Thanksgiving holiday where she has family.  She presented to ER in [place] on 08/2017 with nausea, vomiting, and lower abdominal pain.  Symptoms did not respond to conservative treatment.   CT on 08/26/2017 revealed small bowel obstruction with transition in the pelvis just superior to the uterus rather was a long segment of distal ileum with fatty wall thickening compatible with chronic inflammation and/or radiation enteritis. Imaging showed numerous pulmonary nodules consistent with metastatic disease. She underwent laparotomy and right ileocolectomy on 08/31/2017 at [facility].  Surgical findings revealed a thickened, matted, and scarred piece of distal small bowel close to the ileocecal valve.  She was discharged on 09/05/2017.  Pain markedly increased in intensity and imaging was performed on 09/11/2017 which revealed: Debris within the anterior abdominal wall incision concerning for infection versus packing material, s/p post ileo-colectomy with expected postoperative changes, mild colonic ileus, numerous pulmonary nodules highly concerning for metastatic disease, punctate nonobstructing nephrolithiasis.  Staples were removed and one was packed.  She was started on doxycycline.  Abdominal and pelvic CT without contrast on 09/11/2017 revealed debris within anterior abdominal wall incision concerning  for infection, versus packing  material.   She is s/p ileocolectomy with expected postoperative changes and mild colonic ileus.  There were numerous pulmonary nodules highly concerning for metastatic disease and punctate nonobstructing nephrolithiasis. She was readmitted on 09/12/2017.  She describes the onset of lower abdominal pain on 09/09/2017.  Pain markedly increased in intensity on 09/11/2017.   Staples were removed and the wound packed.  She was started on doxycycline.  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   PET scan was ordered to evaluate enlarging lung nodules with concern for recurrent cervical cancer but scan was delayed due to insurance and need to be performed in [place].  Presented to ER on 12/03/2017 for abdominal pain and emesis.  Imaging concerning for worsening possible uterine infarct and she was admitted to hospital.  Pelvic MRI was unremarkable.  Remote scarring type changes of uterus thought to be possibly related to radiation.  She was discharged on 12/08/2017.  Underwent endoscopy and colonoscopy on 12/20/2017.    On 02/22/2018 she underwent laser ablation of condylomata around the anus and vagina under anesthesia with Dr. Johney Maine.   04/15/2018:  Chest, abdomen, and pelvis CT revealed innumerable (> 100) cavitary nodules scattered in the lungs, moderately enlarging compared to the 11/08/2017 PET-CT, suspicious for metastatic disease.  One index node in the RLL measures 1.0 x 1.1 cm (previously 0.6 x 0.6 cm).  There were no new nodules.  There was an ill-defined wall thickening in the rectosigmoid with surrounding stranding  along fascia planes, probably sequela from prior radiation therapy.  There was multilevel lumbar impingement due to spondylosis and degenerative disc disease.  There was heterogeneous enhancement in the uterus (some possibly from prior radiation therapy).   04/23/2018:  PET scan revealed numerous scattered solid and cavitary nodules in the lungs stable increased in size compared to the prior PET-CT from 11/08/2017. Largest nodule was 1.1 cm in the LUL (SUV 1.9).  These demonstrated low-grade metabolic activity up to a maximum SUV of about 2.3, increased from 11/08/2017.    Case was discussed at tumor board on 04/25/2018. Consensus to pursue CT-guided biopsy (05/06/18) which revealed: Metastatic adenocarcinoma, morphologically consistent with cervical adenocarcinoma.  She has history of chronic hepatitis Hall which is managed by GI.  Hepatitis Hall genotype is 2a/2c.  She receives B12 injections for history of B12 deficiency.  On 04/24/2018 she underwent left total knee replacement with Dr. [name]. --------------------------------------------------------   DIAGNOSIS: Cervical adenocarcinoma  STAGE: IV        ;GOALS: Palliative  CURRENT/MOST RECENT THERAPY : Alimta [[place]]    Malignant neoplasm of overlapping sites of cervix ([place])  01/29/2019 - 04/23/2019 Chemotherapy   The patient had PACLitaxel (TAXOL) 156 mg in sodium chloride 0.9 % 250 mL chemo infusion (</= 22m/m2), 80 mg/m2 = 156 mg, Intravenous,  Once, 3 of 4 cycles Dose modification: 65 mg/m2 (original dose 80 mg/m2, Cycle 1, Reason: Provider Judgment) Administration: 156 mg (01/29/2019), 156 mg (02/05/2019), 126 mg (02/12/2019), 126 mg (02/26/2019), 126 mg (03/13/2019), 126 mg (03/27/2019), 126 mg (03/20/2019), 126 mg (04/03/2019), 126 mg (04/10/2019)  for chemotherapy treatment.    05/08/2019 - 07/30/2019 Chemotherapy  The patient had pembrolizumab (KEYTRUDA) 200 mg in sodium chloride 0.9 % 50 mL chemo infusion, 200 mg, Intravenous, Once, 4 of 6  cycles Administration: 200 mg (05/08/2019), 200 mg (05/29/2019), 200 mg (06/19/2019), 200 mg (07/10/2019)  for chemotherapy treatment.    08/07/2019 -  Chemotherapy   The patient had pegfilgrastim-cbqv [facility]) injection 6 mg, 6 mg, Subcutaneous, Once, 3 of 5 cycles Administration: 6 mg (08/29/2019), 6 mg (09/19/2019) PEMEtrexed (ALIMTA) 1,000 mg in sodium chloride 0.9 % 100 mL chemo infusion, 950 mg, Intravenous,  Once, 4 of 6 cycles Administration: 1,000 mg (08/07/2019), 1,000 mg (09/18/2019), 1,000 mg (11/13/2019), 1,000 mg (08/28/2019)  for chemotherapy treatment.       HISTORY OF PRESENTING ILLNESS:  Joanna Hall 54 y.o.  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  Chemotherapy is on hold because of recent right knee surgery/hematoma.  Patient continues to have diarrhea; associated with nausea vomiting.  She continues to feel poorly.  She feels dizzy.  Notes to have diarrhea.  Complains of of any significant improvement of the right knee pain/swelling.  She is not on anticoagulation. \  Patient frustrated with her clinical situation-call upon hospice evaluation.  Awaiting appointment   Review of Systems  Constitutional: Positive for malaise/fatigue and weight loss. Negative for chills, diaphoresis and fever.  HENT: Negative for nosebleeds and sore throat.   Eyes: Negative for double vision.  Respiratory: Negative for cough, hemoptysis, sputum production, shortness of breath and wheezing.   Cardiovascular: Negative for chest pain, palpitations, orthopnea and leg swelling.  Gastrointestinal: Positive for abdominal pain, constipation and diarrhea. Negative for blood in stool, heartburn, melena, nausea and vomiting.  Genitourinary: Negative for dysuria, frequency and urgency.  Musculoskeletal: Positive for back pain, joint pain and myalgias.  Skin: Negative.   Negative for itching and rash.  Neurological: Positive for tingling. Negative for dizziness, focal weakness, weakness and headaches.  Endo/Heme/Allergies: Does not bruise/bleed easily.  Psychiatric/Behavioral: Negative for depression. The patient is not nervous/anxious and does not have insomnia.      MEDICAL HISTORY:  Past Medical History:  Diagnosis Date  . Abdominal pain 06/10/2018  . Abnormal cervical Papanicolaou smear 09/18/2017  . Anxiety   . Aortic atherosclerosis ([place])   . Arthritis    neck and knees  . 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 Blood transfusion without reported diagnosis   . Cervical cancer ([place]) 09/2016   mets lung  . Chronic anal fissure   . Chronic diarrhea   . Dyspnea   . Erosive gastropathy 09/18/2017  . Factor V Leiden mutation ([place])   . Fecal incontinence   . Genital warts   . GERD (gastroesophageal reflux disease)   . GI bleed 10/08/2018  . Heart murmur   . Hematochezia   . Hemorrhoids   . 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 Hepatitis, chronic ([place]) 05/05/2017  . History of cancer chemotherapy    completed 06/2017  . History of Clostridium difficile infection    while undergoing chemo.  Negative test 10/2017  . Ileocolic anastomotic leak   . 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 Intestinal infection due to Clostridium difficile 09/18/2017  . Macrocytic anemia with vitamin B12 deficiency   . Multiple gastric ulcers   .  Nausea vomiting and diarrhea   . Pancolitis ([place]) 07/27/2018  . Perianal condylomata   . Pneumonia    History of  . Pulmonary nodules   . Rectal bleeding   . Small bowel obstruction ([place]) 08/2017  . Stiff neck    limited right turn  . Vitamin D deficiency     SURGICAL HISTORY: Past Surgical History:  Procedure Laterality Date  . CHOLECYSTECTOMY    . COLON SURGERY  08/2017   resection  . COLONOSCOPY WITH PROPOFOL N/A 12/20/2017   Procedure: COLONOSCOPY  WITH PROPOFOL;  Surgeon: [name], MD;  Location: [facility] ENDOSCOPY;  Service: Gastroenterology;  Laterality: N/A;  . COLONOSCOPY WITH PROPOFOL N/A 07/30/2018   Procedure: COLONOSCOPY WITH PROPOFOL;  Surgeon: [name], MD;  Location: [facility] ENDOSCOPY;  Service: Gastroenterology;  Laterality: N/A;  . COLONOSCOPY WITH PROPOFOL N/A 10/10/2018   Procedure: COLONOSCOPY WITH PROPOFOL;  Surgeon: Lucilla Lame, MD;  Location: [facility] ENDOSCOPY;  Service: Endoscopy;  Laterality: N/A;  . DIAGNOSTIC LAPAROSCOPY    . ESOPHAGOGASTRODUODENOSCOPY (EGD) WITH PROPOFOL N/A 12/20/2017   Procedure: ESOPHAGOGASTRODUODENOSCOPY (EGD) WITH PROPOFOL;  Surgeon: [name], MD;  Location: [place];  Service: Gastroenterology;  Laterality: N/A;  . ESOPHAGOGASTRODUODENOSCOPY (EGD) WITH PROPOFOL  07/30/2018   Procedure: ESOPHAGOGASTRODUODENOSCOPY (EGD) WITH PROPOFOL;  Surgeon: [name], MD;  Location: [facility] ENDOSCOPY;  Service: Gastroenterology;;  . Otho Darner SIGMOIDOSCOPY N/A 11/21/2018   Procedure: FLEXIBLE SIGMOIDOSCOPY;  Surgeon: [name], MD;  Location: [facility] ENDOSCOPY;  Service: Gastroenterology;  Laterality: N/A;  . IVC FILTER INSERTION N/A 01/01/2020   Procedure: IVC FILTER INSERTION;  Surgeon: Algernon Huxley, MD;  Location: [place] CV LAB;  Service: Cardiovascular;  Laterality: N/A;  . LAPAROTOMY N/A 08/31/2017   Procedure: EXPLORATORY LAPAROTOMY for SBO, ileocolectomy, removal of piece of uterine wall;  Surgeon: [name], MD;  Location: [facility] ORS;  Service: General;  Laterality: N/A;  . LASER ABLATION CONDOLAMATA N/A 02/22/2018   Procedure: LASER ABLATION/REMOVAL OF NIOEVOJJKKX AROUND ANUS AND VAGINA;  Surgeon: Michael Boston, MD;  Location: [facility];  Service: General;  Laterality: N/A;  . OOPHORECTOMY    . PORTA CATH INSERTION N/A 05/13/2018   Procedure: PORTA CATH INSERTION;  Surgeon: Katha Cabal, MD;  Location: [place] CV LAB;   Service: Cardiovascular;  Laterality: N/A;  . SMALL INTESTINE SURGERY    . TANDEM RING INSERTION     x3  . THORACOTOMY    . TOTAL KNEE ARTHROPLASTY Left 04/24/2018   Procedure: TOTAL KNEE ARTHROPLASTY;  Surgeon: Lovell Sheehan, MD;  Location: [facility] ORS;  Service: Orthopedics;  Laterality: Left;  . TOTAL KNEE ARTHROPLASTY Right 12/24/2019   Procedure: RIGHT TOTAL KNEE ARTHROPLASTY;  Surgeon: Lovell Sheehan, MD;  Location: [facility] ORS;  Service: Orthopedics;  Laterality: Right;    SOCIAL HISTORY: Social History   Socioeconomic History  . Marital status: Divorced    Spouse name: Not on file  . Number of children: Not on file  . Years of education: Not on file  . Highest education level: Not on file  Occupational History  . Not on file  Tobacco Use  . Smoking status: Former Smoker    Packs/day: 0.25    Years: 10.00    Pack years: 2.50    Types: Cigarettes    Quit date: 10/16/2006    Years since quitting: 13.2  . Smokeless tobacco: Never Used  Substance and Sexual Activity  . Alcohol use: Not Currently    Comment: seldom  .  Drug use: Yes    Types: Marijuana    Comment: not very often  . Sexual activity: Not Currently    Birth control/protection: Post-menopausal    Comment: Not Asked  Other Topics Concern  . Not on file  Social History Narrative  . Not on file   Social Determinants of Health   Financial Resource Strain:   . Difficulty of Paying Living Expenses:   Food Insecurity:   . Worried About Charity fundraiser in the Last Year:   . Arboriculturist in the Last Year:   Transportation Needs:   . Film/video editor (Medical):   Marland Kitchen Lack of Transportation (Non-Medical):   Physical Activity:   . Days of Exercise per Week:   . Minutes of Exercise per Session:   Stress:   . Feeling of Stress :   Social Connections:   . Frequency of Communication with Friends and Family:   . Frequency of Social Gatherings with Friends and Family:   . Attends Religious Services:   .  Active Member of Clubs or Organizations:   . Attends Archivist Meetings:   Marland Kitchen Marital Status:   Intimate Partner Violence:   . Fear of Current or Ex-Partner:   . Emotionally Abused:   Marland Kitchen Physically Abused:   . Sexually Abused:     FAMILY HISTORY: Family History  Problem Relation Age of Onset  . Hypertension Mother   . Hypertension Father   . Diabetes Father   . Hyperlipidemia Father   . Alcohol abuse Daughter   . Hypertension Maternal Grandmother   . Diabetes Maternal Grandmother   . Diabetes Paternal Grandmother   . Hypertension Paternal Grandmother     ALLERGIES:  is allergic to ketamine.  MEDICATIONS:  Current Outpatient Medications  Medication Sig Dispense Refill  . amitriptyline (ELAVIL) 100 MG tablet Take 1 tablet (100 mg total) by mouth at bedtime. 90 tablet 1  . Calcium Carb-Cholecalciferol (CALCIUM 500 +D) 500-400 MG-UNIT TABS Take 1 tablet by mouth daily.     . colchicine 0.6 MG tablet Take 0.6 mg by mouth daily.     . cyanocobalamin (,VITAMIN B-12,) 1000 MCG/ML injection Inject 1,000 mcg into the muscle every 21 ( twenty-one) days.     Marland Kitchen dexamethasone (DECADRON) 4 MG tablet Take 4 mg by mouth See admin instructions. Take 4 mg twice daily for 3 days starting 3 days prior to chemo    . diphenoxylate-atropine (LOMOTIL) 2.5-0.025 MG tablet Take 2 tablets by mouth 2 (two) times daily as needed for diarrhea or loose stools.    Marland Kitchen DM-Phenylephrine-Acetaminophen (COLD RELIEF PO) Take 2 tablets by mouth daily as needed (cold symptoms).    . docusate sodium (COLACE) 100 MG capsule Take 1 capsule (100 mg total) by mouth 2 (two) times daily. 10 capsule 0  . famotidine (PEPCID) 20 MG tablet Take 1 tablet (20 mg total) by mouth 2 (two) times daily. 60 tablet 0  . folic acid (FOLVITE) 1 MG tablet Take 1 tablet (1 mg total) by mouth daily. 30 tablet 6  . hydrOXYzine (VISTARIL) 25 MG capsule Take 1 capsule (25 mg total) by mouth 3 (three) times daily as needed for itching. 30  capsule 0  . hyoscyamine (LEVBID) 0.375 MG 12 hr tablet Take 1 tablet (0.375 mg total) by mouth 2 (two) times daily. 60 tablet 0  . methocarbamol (ROBAXIN) 500 MG tablet Take 1 tablet (500 mg total) by mouth every 6 (six) hours as needed for  muscle spasms. 40 tablet 2  . Multiple Vitamins-Minerals (SUPER THERA VITE M) TABS Take 1 tablet by mouth daily.     . ondansetron (ZOFRAN ODT) 4 MG disintegrating tablet Take 1 tablet (4 mg total) by mouth every 6 (six) hours as needed for nausea or vomiting. 20 tablet 0  . oxyCODONE (ROXICODONE) 15 MG immediate release tablet Take 1-1.5 tablets (15-22.5 mg total) by mouth every 3 (three) hours as needed (for breakthrough pain). 90 tablet 0  . oxyCODONE 30 MG 12 hr tablet Take 1 tablet (30 mg total) by mouth every 8 (eight) hours. 45 tablet 0  . pantoprazole (PROTONIX) 40 MG tablet Take 1 tablet (40 mg total) by mouth daily. 90 tablet 3  . promethazine (PHENERGAN) 25 MG tablet Take 1 tablet (25 mg total) by mouth every 8 (eight) hours as needed for nausea or vomiting. 30 tablet 0  . sertraline (ZOLOFT) 25 MG tablet Take 1 tablet (25 mg total) by mouth daily. 30 tablet 0  . triamcinolone ointment (KENALOG) 0.5 % Apply 1 application topically 2 (two) times daily. (Patient taking differently: Apply 1 application topically 2 (two) times daily as needed (itching). ) 30 g 0  . VENTOLIN HFA 108 (90 Base) MCG/ACT inhaler Inhale 1-2 puffs into the lungs every 4 (four) hours as needed for shortness of breath. 1 Inhaler 1   No current facility-administered medications for this visit.   Facility-Administered Medications Ordered in Other Visits  Medication Dose Route Frequency Provider Last Rate Last Admin  . heparin lock flush 100 unit/mL  500 Units Intravenous Once Joanna Hall, Joanna C, MD      . sodium chloride 0.9 % 1,000 mL with potassium chloride 20 mEq infusion   Intravenous Once Joanna Loh Hall, Joanna Hall      . sodium chloride flush (NS) 0.9 % injection 10 mL  10 mL  Intravenous Once Joanna Hall, Joanna Boys, Joanna Hall          .  PHYSICAL EXAMINATION: ECOG PERFORMANCE STATUS: 1 - Symptomatic but completely ambulatory  Vitals:   01/23/20 1332  BP: 123/87  Pulse: 86  Resp: 20  Temp: 97.6 F (36.4 Hall)   Filed Weights    Physical Exam  Constitutional: She is oriented to person, place, and time and well-developed, well-nourished, and in no distress.  Alone.  Patient in wheelchair.  HENT:  Head: Normocephalic and atraumatic.  Mouth/Throat: Oropharynx is clear and moist. No oropharyngeal exudate.  Eyes: Pupils are equal, round, and reactive to light.  Cardiovascular: Normal rate and regular rhythm.  Pulmonary/Chest: Effort normal and breath sounds normal. No respiratory distress. She has no wheezes.  Abdominal: Soft. Bowel sounds are normal. She exhibits no distension and no mass. There is no rebound and no guarding.  Musculoskeletal:        General: No tenderness or edema. Normal range of motion.     Cervical back: Normal range of motion and neck supple.     Comments: Swelling noted of the right knee/warmth noted.  Neurological: She is alert and oriented to person, place, and time.  Skin: Skin is warm.  Psychiatric: Affect normal.   LABORATORY DATA:  I have reviewed the data as listed Lab Results  Component Value Date   WBC 3.9 (L) 01/23/2020   HGB 11.5 (L) 01/23/2020   HCT 36.8 01/23/2020   MCV 98.1 01/23/2020   PLT 207 01/23/2020   Recent Labs    09/25/19 1338 09/26/19 0556 01/09/20 1244 01/14/20 1042 01/23/20 1254  NA  --    < >  133* 136 137  K  --    < > 3.5 3.8 3.5  CL  --    < > 95* 98 99  CO2  --    < > 24 27 26   GLUCOSE  --    < > 90 114* 107*  BUN  --    < > 7 6 9   CREATININE  --    < > 0.69 0.69 0.59  CALCIUM  --    < > 9.0 9.5 9.1  GFRNONAA  --    < > >60 >60 >60  GFRAA  --    < > >60 >60 >60  PROT 7.4   < > 7.5 8.1 8.0  ALBUMIN 3.5   < > 3.0* 3.4* 3.5  AST 83*   < > 51* 32 24  ALT 134*   < > 27 17 11   ALKPHOS 180*   <  > 223* 194* 173*  BILITOT 0.6   < > 1.4* 1.0 0.7  BILIDIR 0.2  --   --   --   --   IBILI 0.4  --   --   --   --    < > = values in this interval not displayed.    RADIOGRAPHIC STUDIES: I have personally reviewed the radiological images as listed and agreed with the findings in the report. CT ABDOMEN PELVIS W CONTRAST  Result Date: 01/14/2020 CLINICAL DATA:  Right-sided abdominal and pelvic pain. Right lower quadrant and low back pain. EXAM: CT ABDOMEN AND PELVIS WITH CONTRAST TECHNIQUE: Multidetector CT imaging of the abdomen and pelvis was performed using the standard protocol following bolus administration of intravenous contrast. CONTRAST:  120m OMNIPAQUE IOHEXOL 300 MG/ML  SOLN COMPARISON:  12/28/2019 FINDINGS: Lower chest: Numerous nodules again identified in the lung bases, some of which are cavitary. No substantial change compared to the study from 2 weeks ago. Hepatobiliary: No suspicious focal abnormality within the liver parenchyma. Mild intrahepatic biliary duct prominence is stable. Extrahepatic biliary dilatation is stable. Common bile duct measures 13 mm in the head of pancreas. Pancreas: No focal mass lesion. No dilatation of the main duct. No intraparenchymal cyst. No peripancreatic edema. Spleen: No splenomegaly. No focal mass lesion. Adrenals/Urinary Tract: No adrenal nodule or mass. Right kidney unremarkable. Moderate left hydroureteronephrosis is stable since prior with ureteral dilatation extending down into the lower pelvis. Distal ureter not well demonstrated on axial imaging, but coronal imaging suggests enhancing soft tissue in the distal left ureter (coronal image 51/series 5) just proximal to the UVJ. Mild left-sided bladder wall thickening is best appreciated on coronal imaging. Stomach/Bowel: Stomach is unremarkable. No gastric wall thickening. No evidence of outlet obstruction. Duodenum is normally positioned as is the ligament of Treitz. No small bowel wall thickening. No  small bowel dilatation. Status post right hemicolectomy transverse colon is similar to prior, mildly distended and fluid-filled with abrupt transition to decompressed colon with wall thickening in the region of the distal transverse segment/splenic flexure. Left colon remains decompressed down to the rectum. Vascular/Lymphatic: There is abdominal aortic atherosclerosis without aneurysm. IVC filter visualized in situ. There is no gastrohepatic or hepatoduodenal ligament lymphadenopathy. No retroperitoneal or mesenteric lymphadenopathy. No pelvic sidewall lymphadenopathy. Reproductive: Similar appearance of uterus and cervix, remaining ill-defined. Other: No intraperitoneal free fluid. Stranding in the lower pelvis likely treatment related. This is stable. Musculoskeletal: Probable sacral insufficiency fractures with stable sclerosis in the anterior left pubic bone. IMPRESSION: 1. No substantial change in exam. 2. Persistent moderate  left hydroureteronephrosis with apparent enhancing soft tissue at the distal left ureter just proximal to the UVJ. Urothelial neoplasm or involvement by known cervical cancer not excluded. 3. Similar appearance of numerous pulmonary nodules in the lung bases, some of which are cavitary. Imaging features remain compatible with metastatic disease. 4. Left colon remains decompressed which may account for the apparent circumferential wall thickening. Infectious/inflammatory colitis cannot be excluded. 5. Similar appearance of ill-defined uterus and cervix. 6. Aortic Atherosclerosis (ICD10-I70.0). Electronically Signed   By: Misty Stanley M.D.   On: 01/14/2020 12:13   PERIPHERAL VASCULAR CATHETERIZATION  Result Date: 01/01/2020 See op note  US Venous Img Lower Unilateral Right  Result Date: 01/14/2020 CLINICAL DATA:  Right lower extremity pain and edema. History of prior DVT. Right knee arthroplasty earlier this month. EXAM: RIGHT LOWER EXTREMITY VENOUS DOPPLER ULTRASOUND TECHNIQUE:  Gray-scale sonography with graded compression, as well as color Doppler and duplex ultrasound were performed to evaluate the lower extremity deep venous systems from the level of the common femoral vein and including the common femoral, femoral, profunda femoral, popliteal and calf veins including the posterior tibial, peroneal and gastrocnemius veins when visible. The superficial great saphenous vein was also interrogated. Spectral Doppler was utilized to evaluate flow at rest and with distal augmentation maneuvers in the common femoral, femoral and popliteal veins. COMPARISON:  None. FINDINGS: Contralateral Common Femoral Vein: Respiratory phasicity is normal and symmetric with the symptomatic side. No evidence of thrombus. Normal compressibility. Common Femoral Vein: No evidence of thrombus. Normal compressibility, respiratory phasicity and response to augmentation. Saphenofemoral Junction: No evidence of thrombus. Normal compressibility and flow on color Doppler imaging. Profunda Femoral Vein: No evidence of thrombus. Normal compressibility and flow on color Doppler imaging. Femoral Vein: No evidence of thrombus. Normal compressibility, respiratory phasicity and response to augmentation. Popliteal Vein: No evidence of thrombus. Normal compressibility, respiratory phasicity and response to augmentation. Calf Veins: No evidence of thrombus. Normal compressibility and flow on color Doppler imaging. Superficial Great Saphenous Vein: No evidence of thrombus. Normal compressibility. Venous Reflux:  None. Other Findings: No evidence of superficial thrombophlebitis. Small complex fluid collection in the right popliteal fossa measuring just over 2 cm likely relates to the recent knee surgery. IMPRESSION: No evidence of right lower extremity deep venous thrombosis. Electronically Signed   By: Aletta Edouard M.D.   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  #CT imaging March 2021-progressive lung nodules/hydronephrosis worsening likely secondary malignancy.  However team on hold because of acute pain right knee hematoma.  #A long discussion the patient regarding the difficulty in offering chemotherapy given in general poor tolerance of therapy/especially the context of her acute pain/hematoma of the right knee.  Patient understands that any further complication of chemotherapy would put her back right in the hospital.  She understands that she might not be any further candidate for chemotherapy.  Patient very tearful-see below  #Right knee pain swelling from hematoma post surgery-not well controlled.  Continue pain management as per palliative care.  #History of PE DVT factor V Leiden heterozygosity-anticoagulation is on hold because of right knee hematoma.  Patient s/p IVC filter placement.  Continue to hold anticoagulation.  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  Clinically stable.  # Adrenal  insufficiency-on hydrocortisone 20 mg/day; hydrocortisone in PM.  Stable  # Hypomagnesemia-secondary platinum chemotherapy-mag 1.4-stable mag infusion today.  #Chronic right lower quadrant abdominal pain/scar tissue versus others-poorly controlled- on oxycodone; poorly controlled given the worsening arthritic pain control.   #Given overall poor prognosis-hospice is reasonable.  Patient states that she called hospice herself.  Awaiting meeting with hospice.  # DISPOSITION:  # Mag IV infusions today; zofran; IV morphine  #  Follow up in 2 weeks-MD; labs-cbc/cmp/mag; ; MD; no chemo; possible mag IV 2 hour. Dr.B     All questions were answered. The patient knows to call the clinic with any problems, questions or concerns.     Cammie Sickle, MD 01/29/2020 3:28 PM

## 2020-01-26 ENCOUNTER — Other Ambulatory Visit: Payer: Medicaid Other | Admitting: Primary Care

## 2020-01-27 ENCOUNTER — Ambulatory Visit: Payer: Medicaid Other | Admitting: Physician Assistant

## 2020-01-27 ENCOUNTER — Telehealth: Payer: Self-pay

## 2020-01-27 NOTE — Telephone Encounter (Signed)
SW received VM from patient with update and questions about Medicare and disability, and next PT visit with [facility].  SW attempted to contact patient. SW left VM with contact information.

## 2020-01-29 ENCOUNTER — Other Ambulatory Visit: Payer: Self-pay

## 2020-01-29 ENCOUNTER — Other Ambulatory Visit: Payer: Medicaid Other | Admitting: Primary Care

## 2020-02-02 ENCOUNTER — Encounter: Payer: Self-pay | Admitting: Emergency Medicine

## 2020-02-02 ENCOUNTER — Emergency Department
Admission: EM | Admit: 2020-02-02 | Discharge: 2020-02-02 | Disposition: A | Discharge status: Home / Self Care | Payer: Medicaid Other | Attending: Emergency Medicine | Admitting: Emergency Medicine

## 2020-02-02 DIAGNOSIS — Z8541 Personal history of malignant neoplasm of cervix uteri: Secondary | ICD-10-CM | POA: Insufficient documentation

## 2020-02-02 DIAGNOSIS — F1210 Cannabis abuse, uncomplicated: Secondary | ICD-10-CM | POA: Diagnosis not present

## 2020-02-02 DIAGNOSIS — Z9221 Personal history of antineoplastic chemotherapy: Secondary | ICD-10-CM | POA: Insufficient documentation

## 2020-02-02 DIAGNOSIS — R197 Diarrhea, unspecified: Secondary | ICD-10-CM | POA: Insufficient documentation

## 2020-02-02 DIAGNOSIS — I10 Essential (primary) hypertension: Secondary | ICD-10-CM | POA: Insufficient documentation

## 2020-02-02 DIAGNOSIS — Z96651 Presence of right artificial knee joint: Secondary | ICD-10-CM | POA: Diagnosis not present

## 2020-02-02 DIAGNOSIS — R112 Nausea with vomiting, unspecified: Secondary | ICD-10-CM | POA: Insufficient documentation

## 2020-02-02 DIAGNOSIS — Z87891 Personal history of nicotine dependence: Secondary | ICD-10-CM | POA: Diagnosis not present

## 2020-02-02 DIAGNOSIS — G894 Chronic pain syndrome: Secondary | ICD-10-CM | POA: Diagnosis not present

## 2020-02-02 LAB — CBC WITH DIFFERENTIAL/PLATELET
Abs Immature Granulocytes: 0.01 10*3/uL (ref 0.00–0.07)
Basophils Absolute: 0 10*3/uL (ref 0.0–0.1)
Basophils Relative: 0 %
Eosinophils Absolute: 0 10*3/uL (ref 0.0–0.5)
Eosinophils Relative: 1 %
HCT: 33.7 % — ABNORMAL LOW (ref 36.0–46.0)
Hemoglobin: 11 g/dL — ABNORMAL LOW (ref 12.0–15.0)
Immature Granulocytes: 0 %
Lymphocytes Relative: 19 %
Lymphs Abs: 0.8 10*3/uL (ref 0.7–4.0)
MCH: 30.9 pg (ref 26.0–34.0)
MCHC: 32.6 g/dL (ref 30.0–36.0)
MCV: 94.7 fL (ref 80.0–100.0)
Monocytes Absolute: 0.3 10*3/uL (ref 0.1–1.0)
Monocytes Relative: 7 %
Neutro Abs: 3 10*3/uL (ref 1.7–7.7)
Neutrophils Relative %: 73 %
Platelets: 200 10*3/uL (ref 150–400)
RBC: 3.56 MIL/uL — ABNORMAL LOW (ref 3.87–5.11)
RDW: 13.4 % (ref 11.5–15.5)
WBC: 4.1 10*3/uL (ref 4.0–10.5)
nRBC: 0 % (ref 0.0–0.2)

## 2020-02-02 LAB — COMPREHENSIVE METABOLIC PANEL
ALT: 10 U/L (ref 0–44)
AST: 19 U/L (ref 15–41)
Albumin: 3 g/dL — ABNORMAL LOW (ref 3.5–5.0)
Alkaline Phosphatase: 129 U/L — ABNORMAL HIGH (ref 38–126)
Anion gap: 11 (ref 5–15)
BUN: 8 mg/dL (ref 6–20)
CO2: 23 mmol/L (ref 22–32)
Calcium: 9 mg/dL (ref 8.9–10.3)
Chloride: 100 mmol/L (ref 98–111)
Creatinine, Ser: 0.6 mg/dL (ref 0.44–1.00)
GFR calc Af Amer: >60 mL/min (ref >60)
GFR calc non Af Amer: >60 mL/min (ref >60)
Glucose, Bld: 112 mg/dL — ABNORMAL HIGH (ref 70–99)
Potassium: 3.3 mmol/L — ABNORMAL LOW (ref 3.5–5.1)
Sodium: 134 mmol/L — ABNORMAL LOW (ref 135–145)
Total Bilirubin: 0.5 mg/dL (ref 0.3–1.2)
Total Protein: 7.2 g/dL (ref 6.5–8.1)

## 2020-02-02 LAB — URINALYSIS, COMPLETE (UACMP) WITH MICROSCOPIC
Bilirubin Urine: NEGATIVE
Glucose, UA: NEGATIVE mg/dL
Hgb urine dipstick: NEGATIVE
Ketones, ur: NEGATIVE mg/dL
Leukocytes,Ua: NEGATIVE
Nitrite: NEGATIVE
Protein, ur: NEGATIVE mg/dL
Specific Gravity, Urine: 1.016 (ref 1.005–1.030)
Squamous Epithelial / HPF: NONE SEEN (ref 0–5)
pH: 7 (ref 5.0–8.0)

## 2020-02-02 LAB — MAGNESIUM: Magnesium: 1.4 mg/dL — ABNORMAL LOW (ref 1.7–2.4)

## 2020-02-02 LAB — LIPASE, BLOOD: Lipase: 31 U/L (ref 11–51)

## 2020-02-02 MED ORDER — HEPARIN SOD (PORK) LOCK FLUSH 100 UNIT/ML IV SOLN
500.0000 [IU] | Freq: Once | INTRAVENOUS | Status: AC
  Administered 2020-02-02: 500 [IU] via INTRAVENOUS
  Filled 2020-02-02: qty 5

## 2020-02-02 MED ORDER — HYDROMORPHONE HCL 1 MG/ML IJ SOLN
1.0000 mg | Freq: Once | INTRAMUSCULAR | Status: AC
  Administered 2020-02-02: 1 mg via INTRAVENOUS
  Filled 2020-02-02: qty 1

## 2020-02-02 MED ORDER — ONDANSETRON HCL 4 MG/2ML IJ SOLN
4.0000 mg | Freq: Once | INTRAMUSCULAR | Status: AC
  Administered 2020-02-02: 4 mg via INTRAVENOUS
  Filled 2020-02-02: qty 2

## 2020-02-02 MED ORDER — PROMETHAZINE HCL 25 MG PO TABS: 25.0000 mg | ORAL_TABLET | ORAL | 1 refills | Status: DC | PRN

## 2020-02-02 MED ORDER — SODIUM CHLORIDE 0.9 % IV SOLN: Freq: Once | INTRAVENOUS | Status: AC

## 2020-02-02 MED ORDER — PROMETHAZINE HCL 25 MG/ML IJ SOLN
25.0000 mg | Freq: Once | INTRAMUSCULAR | Status: AC
  Administered 2020-02-02: 25 mg via INTRAVENOUS
  Filled 2020-02-02: qty 1

## 2020-02-02 MED ORDER — MAGNESIUM SULFATE 2 GM/50ML IV SOLN
2.0000 g | Freq: Once | INTRAVENOUS | Status: AC
  Administered 2020-02-02: 2 g via INTRAVENOUS
  Filled 2020-02-02: qty 50

## 2020-02-02 NOTE — ED Notes (Signed)
Deaccessed port with saline fluid and heparin flush.

## 2020-02-02 NOTE — ED Notes (Signed)
Report received from Maudie Mercury, [place]. Pt is resting in ED stretcher. Pt is c/o pain at this time and mild nausea. Dr. Jimmye Norman, MD notified.

## 2020-02-02 NOTE — ED Triage Notes (Addendum)
Pt to ED by [facility] for emesis for approx 1 week. Pt states "unable to keep anything down". Pt with c/o of new LLQ abdominal pain that is new. Pt has hx of cancer and recent right knee replacement and unable to receive cancer treatments due to surgery. Pt also has c/o of diarrhea which she states is chronic.

## 2020-02-02 NOTE — ED Notes (Signed)
Pt able to keep water down.

## 2020-02-02 NOTE — ED Provider Notes (Signed)
[facility] Emergency Department Provider Note       Time seen: ----------------------------------------- 8:04 AM on 02/02/2020 -----------------------------------------   I have reviewed the triage vital signs and the nursing notes.  HISTORY Chief Complaint Emesis   HPI Joanna Hall is a 54 y.o. female with a history of anxiety, arthritis, blood clots, cervical cancer with metastasis, factor V Leiden mutation, hepatitis C who presents to the ED for vomiting for the last week with inability to keep anything down.  Patient states she is unable to receive her cancer treatments right now, complains of diarrhea daily which she states is chronic.  Pain is 10 out of 10 in the abdomen which is acute on chronic.  Past Medical History:  Diagnosis Date  . Abdominal pain 06/10/2018  . Abnormal cervical Papanicolaou smear 09/18/2017  . Anxiety   . Aortic atherosclerosis ([place])   . Arthritis    neck and knees  . 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 Blood transfusion without reported diagnosis   . Cervical cancer ([place]) 09/2016   mets lung  . Chronic anal fissure   . Chronic diarrhea   . Dyspnea   . Erosive gastropathy 09/18/2017  . Factor V Leiden mutation ([place])   . Fecal incontinence   . Genital warts   . GERD (gastroesophageal reflux disease)   . GI bleed 10/08/2018  . Heart murmur   . Hematochezia   . Hemorrhoids   . 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 Hepatitis, chronic ([place]) 05/05/2017  . History of cancer chemotherapy    completed 06/2017  . History of Clostridium difficile infection    while undergoing chemo.  Negative test 10/2017  . Ileocolic anastomotic leak   . 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 Intestinal infection due to Clostridium difficile 09/18/2017  . Macrocytic anemia with vitamin B12 deficiency   . Multiple gastric ulcers   . Nausea vomiting and diarrhea   .  Pancolitis ([place]) 07/27/2018  . Perianal condylomata   . Pneumonia    History of  . Pulmonary nodules   . Rectal bleeding   . Small bowel obstruction ([place]) 08/2017  . Stiff neck    limited right turn  . Vitamin D deficiency     Patient Active Problem List   Diagnosis Date Noted  . Hydroureter 12/29/2019  . Transaminitis   . Right leg pain 12/28/2019  . History of total knee arthroplasty, right 12/24/2019  . Moderate episode of recurrent major depressive disorder ([place]) 11/13/2019  . Acute colitis 09/25/2019  . Anxiety and depression   . Thrombocytopenia ([place]) 08/17/2019  . Intractable lower abdominal pain   . Abdominal pain 07/23/2019  . Constipation 07/22/2019  . Malnutrition of moderate degree 06/22/2019  . Small bowel obstruction due to adhesions ([place]) 06/21/2019  . Acute gastroenteritis 06/14/2019  . Intractable nausea and vomiting 06/07/2019  . Chest pain 05/19/2019  . Intractable pain 05/19/2019  . Cancer associated pain   . Proctitis, radiation   . Palliative care encounter   . Encounter for monitoring opioid maintenance therapy 11/19/2018  . Adrenal insufficiency ([place]) 09/03/2018  . Nausea vomiting and diarrhea   . Chronic diarrhea 06/25/2018  . Chronic anticoagulation 06/25/2018  . Vomiting   . Encounter for antineoplastic chemotherapy 06/04/2018  . 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 Lung metastasis ([place]) 05/15/2018  . Closed fracture  of distal end of radius 05/04/2018  . History of total knee arthroplasty 04/24/2018  . Osteoarthritis of left knee 02/28/2018  . Condyloma acuminatum of anus s/p ablation 02/22/2018 02/22/2018  . Condyloma acuminatum of vagina s/p ablation 02/22/2018 02/22/2018  . Positive ANA (antinuclear antibody) 02/04/2018  . Aortic atherosclerosis ([place]) 01/15/2018  . Elevated MCV 01/15/2018  . Anemia 01/15/2018  . Genital warts 01/15/2018  . Vitamin D deficiency 01/15/2018  . Pernicious anemia   . Impingement syndrome of shoulder  region 12/19/2017  . Multiple joint pain 12/19/2017  . Osteoarthritis of right knee 12/19/2017  . B12 deficiency 12/11/2017  . Lung nodules 12/11/2017  . History of Clostridium difficile infection 10/23/2017  . Factor V Leiden ([place]) 10/19/2017  . Goals of care, counseling/discussion 10/19/2017  . Cervical cancer, FIGO stage IB1 ([place]) 09/18/2017  . Chronic hepatitis C without hepatic coma ([place]) 09/18/2017  . Cytopenia 09/18/2017  . Diarrhea 09/18/2017  . Elevated LFTs 09/18/2017  . Lower abdominal pain 09/18/2017  . 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 Malignant neoplasm of overlapping sites of cervix ([place]) 09/18/2017  . Renal insufficiency 09/18/2017  . Wound infection after surgery 09/12/2017  . Hypokalemia   . Hypomagnesemia   . Cervical arthritis 07/18/2017  . Dysuria 06/20/2017  . Metastasis from cervical cancer ([place]) 05/12/2017  . Essential hypertension 03/15/2017  . Anemia of chronic disease 03/01/2017  . Chemotherapy-induced neutropenia ([place]) 01/29/2017  . 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[place]) 09/25/2016    Past Surgical History:  Procedure Laterality Date  . CHOLECYSTECTOMY    . COLON SURGERY  08/2017   resection  . COLONOSCOPY WITH PROPOFOL N/A 12/20/2017   Procedure: COLONOSCOPY WITH PROPOFOL;  Surgeon: [name], MD;  Location: [facility] ENDOSCOPY;  Service: Gastroenterology;  Laterality: N/A;  . COLONOSCOPY WITH PROPOFOL N/A 07/30/2018   Procedure: COLONOSCOPY WITH PROPOFOL;  Surgeon: [name], MD;  Location: [facility] ENDOSCOPY;  Service: Gastroenterology;  Laterality: N/A;  . COLONOSCOPY WITH PROPOFOL N/A 10/10/2018   Procedure: COLONOSCOPY WITH PROPOFOL;  Surgeon: Lucilla Lame, MD;  Location: [facility] ENDOSCOPY;  Service: Endoscopy;  Laterality: N/A;  . DIAGNOSTIC LAPAROSCOPY    . ESOPHAGOGASTRODUODENOSCOPY (EGD) WITH PROPOFOL N/A 12/20/2017   Procedure: ESOPHAGOGASTRODUODENOSCOPY (EGD) WITH PROPOFOL;  Surgeon: [name], MD;  Location: [place];  Service: Gastroenterology;  Laterality: N/A;  . ESOPHAGOGASTRODUODENOSCOPY (EGD) WITH PROPOFOL  07/30/2018   Procedure: ESOPHAGOGASTRODUODENOSCOPY (EGD) WITH PROPOFOL;  Surgeon: [name], MD;  Location: [facility] ENDOSCOPY;  Service: Gastroenterology;;  . Otho Darner SIGMOIDOSCOPY N/A 11/21/2018   Procedure: FLEXIBLE SIGMOIDOSCOPY;  Surgeon: [name], MD;  Location: [facility] ENDOSCOPY;  Service: Gastroenterology;  Laterality: N/A;  . IVC FILTER INSERTION N/A 01/01/2020   Procedure: IVC FILTER INSERTION;  Surgeon: Algernon Huxley, MD;  Location: [place] CV LAB;  Service: Cardiovascular;  Laterality: N/A;  . LAPAROTOMY N/A 08/31/2017   Procedure: EXPLORATORY LAPAROTOMY for SBO, ileocolectomy, removal of piece of uterine wall;  Surgeon: [name], MD;  Location: [facility] ORS;  Service: General;  Laterality: N/A;  . LASER ABLATION CONDOLAMATA N/A 02/22/2018   Procedure: LASER ABLATION/REMOVAL OF GGYIRSWNIOE AROUND ANUS AND VAGINA;  Surgeon: Michael Boston, MD;  Location: [place];  Service: General;  Laterality: N/A;  . OOPHORECTOMY    . PORTA CATH INSERTION N/A 05/13/2018   Procedure: PORTA CATH INSERTION;  Surgeon: Katha Cabal, MD;  Location: [place] CV LAB;  Service: Cardiovascular;  Laterality: N/A;  . SMALL INTESTINE SURGERY    . TANDEM RING  INSERTION     x3  . THORACOTOMY    . TOTAL KNEE ARTHROPLASTY Left 04/24/2018   Procedure: TOTAL KNEE ARTHROPLASTY;  Surgeon: Lovell Sheehan, MD;  Location: [facility] ORS;  Service: Orthopedics;  Laterality: Left;  . TOTAL KNEE ARTHROPLASTY Right 12/24/2019   Procedure: RIGHT TOTAL KNEE ARTHROPLASTY;  Surgeon: Lovell Sheehan, MD;  Location: [facility] ORS;  Service: Orthopedics;  Laterality: Right;    Allergies Ketamine  Social History Social History   Tobacco Use  . Smoking status: Former Smoker    Packs/day: 0.25    Years: 10.00    Pack years: 2.50    Types: Cigarettes    Quit date: 10/16/2006    Years  since quitting: 13.3  . Smokeless tobacco: Never Used  Substance Use Topics  . Alcohol use: Not Currently    Comment: seldom  . Drug use: Yes    Types: Marijuana    Comment: not very often    Review of Systems Constitutional: Negative for fever. Cardiovascular: Negative for chest pain. Respiratory: Negative for shortness of breath. Gastrointestinal: Positive for abdominal pain, vomiting and diarrhea Musculoskeletal: Negative for back pain. Skin: Negative for rash. Neurological: Negative for headaches, focal weakness or numbness.  All systems negative/normal/unremarkable except as stated in the HPI  ____________________________________________   PHYSICAL EXAM:  VITAL SIGNS: ED Triage Vitals  Enc Vitals Group     BP      Pulse      Resp      Temp      Temp src      SpO2      Weight      Height      Head Circumference      Peak Flow      Pain Score      Pain Loc      Pain Edu?      Excl. in [place]?     Constitutional: Alert and oriented.  Chronically ill-appearing, mild distress Eyes: Conjunctivae are normal. Normal extraocular movements. ENT      Head: Normocephalic and atraumatic.      Nose: No congestion/rhinnorhea.      Mouth/Throat: Mucous membranes are moist.      Neck: No stridor. Cardiovascular: Normal rate, regular rhythm. No murmurs, rubs, or gallops. Respiratory: Normal respiratory effort without tachypnea nor retractions. Breath sounds are clear and equal bilaterally. No wheezes/rales/rhonchi. Gastrointestinal: Soft and nonfocally tender, hypoactive bowel sounds Musculoskeletal: Nontender with normal range of motion in extremities. No lower extremity tenderness nor edema. Neurologic:  Normal speech and language. No gross focal neurologic deficits are appreciated.  Skin:  Skin is warm, dry and intact.  Pallor is noted Psychiatric: Mood and affect are normal. Speech and behavior are normal.  ____________________________________________  ED COURSE:  As  part of my medical decision making, I reviewed the following data within the [place] History obtained from family if available, nursing notes, old chart and ekg, as well as notes from prior ED visits. Patient presented for persistent vomiting, we will assess with labs and imaging as indicated at this time. Clinical Course as of Feb 02 1220  Mon Feb 02, 2020  1007 Patient reports to feeling some better, still has some nausea requesting Phenergan.   [JW]    Clinical Course User Index [JW] Earleen Newport, MD   Procedures  Joanna Hall was evaluated in Emergency Department on 02/02/2020 for the symptoms described in the history of present illness. She was evaluated in  the context of the global COVID-19 pandemic, which necessitated consideration that the patient might be at risk for infection with the SARS-CoV-2 virus that causes COVID-19. Institutional protocols and algorithms that pertain to the evaluation of patients at risk for COVID-19 are in a state of rapid change based on information released by regulatory bodies including the CDC and federal and state organizations. These policies and algorithms were followed during the patient's care in the ED.  ____________________________________________   LABS (pertinent positives/negatives)  Labs Reviewed  CBC WITH DIFFERENTIAL/PLATELET - Abnormal; Notable for the following components:      Result Value   RBC 3.56 (*)    Hemoglobin 11.0 (*)    HCT 33.7 (*)    All other components within normal limits  COMPREHENSIVE METABOLIC PANEL - Abnormal; Notable for the following components:   Sodium 134 (*)    Potassium 3.3 (*)    Glucose, Bld 112 (*)    Albumin 3.0 (*)    Alkaline Phosphatase 129 (*)    All other components within normal limits  URINALYSIS, COMPLETE (UACMP) WITH MICROSCOPIC - Abnormal; Notable for the following components:   Color, Urine YELLOW (*)    APPearance CLEAR (*)    Bacteria, UA RARE (*)     All other components within normal limits  MAGNESIUM - Abnormal; Notable for the following components:   Magnesium 1.4 (*)    All other components within normal limits  LIPASE, BLOOD  ____________________________________________   DIFFERENTIAL DIAGNOSIS   Gastroenteritis, dehydration, electrolyte abnormality, chronic pain, occult infection, obstruction, metastatic cancer  FINAL ASSESSMENT AND PLAN  Non-intractable vomiting, chronic abdominal pain   Plan: The patient had presented for persistent vomiting. Patient's labs did not reveal any acute process.  She did not require any imaging during her visits.  She was given Dilaudid and Phenergan here which seemed to help her symptoms the most.  She states she is feeling better and has tolerated liquids.  She is cleared for outpatient follow-up.   Laurence Aly, MD    Note: This note was generated in part or whole with voice recognition software. Voice recognition is usually quite accurate but there are transcription errors that can and very often do occur. I apologize for any typographical errors that were not detected and corrected.     Earleen Newport, MD 02/02/20 [phone]

## 2020-02-04 ENCOUNTER — Other Ambulatory Visit: Payer: Self-pay | Admitting: Internal Medicine

## 2020-02-04 ENCOUNTER — Other Ambulatory Visit: Payer: Self-pay | Admitting: Gastroenterology

## 2020-02-04 NOTE — Telephone Encounter (Signed)
Last office visit 05/06/2019 chronic diarrhea  Last refill 09/29/2019  No appointment is scheduled  Seen in the ED on 02/02/2020

## 2020-02-05 ENCOUNTER — Other Ambulatory Visit: Payer: Medicaid Other | Admitting: Primary Care

## 2020-02-05 ENCOUNTER — Other Ambulatory Visit: Payer: Self-pay

## 2020-02-05 ENCOUNTER — Encounter: Payer: Self-pay | Admitting: Oncology

## 2020-02-05 ENCOUNTER — Other Ambulatory Visit: Payer: Self-pay | Admitting: Hospice and Palliative Medicine

## 2020-02-05 DIAGNOSIS — R1030 Lower abdominal pain, unspecified: Secondary | ICD-10-CM

## 2020-02-05 DIAGNOSIS — Z515 Encounter for palliative care: Secondary | ICD-10-CM

## 2020-02-05 MED ORDER — OXYCODONE HCL ER 30 MG PO T12A: 30.0000 mg | EXTENDED_RELEASE_TABLET | Freq: Three times a day (TID) | ORAL | 0 refills | Status: DC

## 2020-02-05 MED ORDER — OXYCODONE HCL 15 MG PO TABS: 15.0000 mg | ORAL_TABLET | ORAL | 0 refills | Status: DC | PRN

## 2020-02-05 NOTE — Progress Notes (Unsigned)
Pre screening call completed. Patient spoke with [name] earlier and refills were sent for Oxycontin and Oxycodone, patient denies other concerns.

## 2020-02-05 NOTE — Progress Notes (Signed)
[facility] Consult Note Telephone: [phone]  Fax: [phone]    PATIENT NAME: Joanna Hall [address] [place] [address] [phone] (home)  DOB: 06-29-1966 MRN: [id number]  PRIMARY CARE PROVIDER:   Towanda Malkin, MD, [address] [place] [address] [place], Kirt Boys, NP [place], Kirt Boys, NP [place],  [place] [address]  RESPONSIBLE PARTY:   Extended Emergency Contact Information Primary Emergency Contact: Ivan Anchors Mobile Phone: [phone] Relation: Mother Secondary Emergency Contact: Ross,April Mobile Phone: [phone] Relation: Sister   I met with patient in the home. I met with Joanna Hall today in her home. She's currently living with her parents after moving back from the [place] area due to illness.   ASSESSMENT AND RECOMMENDATIONS:   1. Advance Care Planning/Goals of Care: Goals include to maximize quality of life and symptom management. She stated she has a DNR. I explained the MOST formal questions and she said she wanted to think about it. I've left the most form and will re-view it on our next visit. DNR is in [facility] system and she states she has a hard copy.  2. Symptom Management:   She explained her diagnosis and how she was diagnosed with cervical cancer and underwent radiation and chemo. She states many complications and chronic pain and mobility issues. She has stated she does not want to consider further chemo or radiation. She feels that she cannot bounce back from the chemo and was hoping to feel better since it's already been two months since her last dose. We discussed the debility of disease but that treating and managing symptoms to optimize quality of life may help her feel better for remaining time. She states she doesn't think she'll be here this time next year.   Pain management: She  endorses an alcohol and drug history. Currently she says she takes her oxycodone and OxyContin as needed. I asked her to discuss her pain needs with [name] Borders Dnp Np is prescribing her medication. She should be taking oxycontin atc with oxycodone for prn. She may benefit from an anti-depressant as part of her pain management protocol. She states that a Dietitian who came into their home may have taken some of her oxycodone. Discussed pain in abdomen and in knee from surgery. I instructed her to lock up meds.  Nutrition: Is poor, her appetite is not good. Albumin is 3.0.I suggested nutritional supplements which she said were not palatable I would encourage her to drink liquid nutrition including protein supplements as tolerated.   Diarrhea: Has chronic from damage of treatments, surgeries and radiation. Has not responded to a range of modalities.  Depression: Needs SSRI, had been on zoloft but stopped. Recommend restarting Zoloft 50 mg with option to increase to effect.  Vaccine: Refuses covid 19 vaccine. Had covid 19 illness.  3. Family /Caregiver/Community Supports:  Living with parents. Has brother also with metastatic cancer.  4. Cognitive / Functional decline: A and O but endorses forgetfulness. Ambulatory, can do self alds for now.   5. Follow up Palliative Care Visit: Palliative care will continue to follow for goals of care clarification and symptom management. Return 4 weeks or prn.  I spent 75 minutes providing this consultation,  from 1100 to 1215. More than 50% of the time in this consultation was spent coordinating communication.   HISTORY OF PRESENT ILLNESS:  Joanna Hall is a 54 y.o. year  old female with multiple medical problems including metastatic cervical cancer, OA, knee replacements, diarrhea, h/o sbo, chronic pain. Palliative Care was asked to follow this patient by consultation request of Borders, Kirt Boys, NP  to help address advance care planning and goals  of care. This is a follow up visit.  CODE STATUS: DNR, MOST left  PPS: 40% HOSPICE ELIGIBILITY/DIAGNOSIS: TBD   PAST MEDICAL HISTORY:  Past Medical History:  Diagnosis Date  . Abdominal pain 06/10/2018  . Abnormal cervical Papanicolaou smear 09/18/2017  . Anxiety   . Aortic atherosclerosis ([facility])   . Arthritis    neck and knees  . 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 Blood transfusion without reported diagnosis   . Cervical cancer ([facility]) 09/2016   mets lung  . Chronic anal fissure   . Chronic diarrhea   . Dyspnea   . Erosive gastropathy 09/18/2017  . Factor V Leiden mutation ([facility])   . Fecal incontinence   . Genital warts   . GERD (gastroesophageal reflux disease)   . GI bleed 10/08/2018  . Heart murmur   . Hematochezia   . Hemorrhoids   . 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 Hepatitis, chronic ([facility]) 05/05/2017  . History of cancer chemotherapy    completed 06/2017  . History of Clostridium difficile infection    while undergoing chemo.  Negative test 10/2017  . Ileocolic anastomotic leak   . 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 Intestinal infection due to Clostridium difficile 09/18/2017  . Macrocytic anemia with vitamin B12 deficiency   . Multiple gastric ulcers   . Nausea vomiting and diarrhea   . Pancolitis ([facility]) 07/27/2018  . Perianal condylomata   . Pneumonia    History of  . Pulmonary nodules   . Rectal bleeding   . Small bowel obstruction ([facility]) 08/2017  . Stiff neck    limited right turn  . Vitamin D deficiency     SOCIAL HX:  Social History   Tobacco Use  . Smoking status: Former Smoker    Packs/day: 0.25    Years: 10.00    Pack years: 2.50    Types: Cigarettes    Quit date: 10/16/2006    Years since quitting: 13.3  . Smokeless tobacco: Never Used  Substance Use Topics  . Alcohol use: Not Currently    Comment: seldom    ALLERGIES:  Allergies  Allergen Reactions  . Ketamine  Anxiety and Other (See Comments)    Syncope episode/confusion      PERTINENT MEDICATIONS:  Outpatient Encounter Medications as of 02/05/2020  Medication Sig  . amitriptyline (ELAVIL) 100 MG tablet Take 1 tablet (100 mg total) by mouth at bedtime.  . diphenoxylate-atropine (LOMOTIL) 2.5-0.025 MG tablet TAKE (2) TABLETS FOUR TIMES DAILY.  Marland Kitchen docusate sodium (COLACE) 100 MG capsule Take 1 capsule (100 mg total) by mouth 2 (two) times daily. (Patient not taking: Reported on 02/02/2020)  . famotidine (PEPCID) 20 MG tablet Take 1 tablet (20 mg total) by mouth 2 (two) times daily. (Patient taking differently: Take 20 mg by mouth daily as needed for heartburn. )  . folic acid (FOLVITE) 1 MG tablet Take 1 tablet (1 mg total) by mouth daily.  . hydrOXYzine (VISTARIL) 25 MG capsule Take 1 capsule (25 mg total) by mouth 3 (three) times daily as needed for itching.  . hyoscyamine (LEVBID) 0.375 MG  12 hr tablet Take 1 tablet (0.375 mg total) by mouth 2 (two) times daily.  . methocarbamol (ROBAXIN) 500 MG tablet Take 1 tablet (500 mg total) by mouth every 6 (six) hours as needed for muscle spasms.  . Multiple Vitamins-Minerals (SUPER THERA VITE M) TABS Take 1 tablet by mouth daily.   . ondansetron (ZOFRAN ODT) 4 MG disintegrating tablet Take 1 tablet (4 mg total) by mouth every 6 (six) hours as needed for nausea or vomiting.  Marland Kitchen oxyCODONE (ROXICODONE) 15 MG immediate release tablet Take 1-1.5 tablets (15-22.5 mg total) by mouth every 3 (three) hours as needed (for breakthrough pain).  . pantoprazole (PROTONIX) 40 MG tablet Take 1 tablet (40 mg total) by mouth daily.  . promethazine (PHENERGAN) 25 MG tablet Take 1 tablet (25 mg total) by mouth every 8 (eight) hours as needed for nausea or vomiting.  . promethazine (PHENERGAN) 25 MG tablet Take 1 tablet (25 mg total) by mouth every 4 (four) hours as needed for nausea or vomiting.  . promethazine (PHENERGAN) 25 MG tablet Take 1 tablet (25 mg total) by mouth every 8  (eight) hours as needed for nausea or vomiting.  . sertraline (ZOLOFT) 25 MG tablet Take 1 tablet (25 mg total) by mouth daily. (Patient not taking: Reported on 02/02/2020)  . triamcinolone ointment (KENALOG) 0.5 % Apply 1 application topically 2 (two) times daily. (Patient taking differently: Apply 1 application topically 2 (two) times daily as needed (itching). )  . VENTOLIN HFA 108 (90 Base) MCG/ACT inhaler Inhale 1-2 puffs into the lungs every 4 (four) hours as needed for shortness of breath.   Facility-Administered Encounter Medications as of 02/05/2020  Medication  . heparin lock flush 100 unit/mL  . sodium chloride 0.9 % 1,000 mL with potassium chloride 20 mEq infusion  . sodium chloride flush (NS) 0.9 % injection 10 mL    PHYSICAL EXAM / ROS:   Current and past weights: reported, 155 lbs, wt in health was 230 lbs General: NAD, frail appearing, anorexia Cardiovascular: no chest pain reported, no le edema Pulmonary: no cough, no increased SOB, room air Abdomen: appetite fair, endorses diarrhea, incontinent of bowel GU: denies dysuria, incontinent of urine MSK:  R knee inflammation, known to ortho, ambulatory in home Skin: no rashes or wounds reported Neurological: Weakness, chronic pain  Jason Coop, NP [facility]  COVID-19 PATIENT SCREENING TOOL  Person answering questions: ________Self______ _____   1.  Is the patient or any family member in the home showing any signs or symptoms regarding respiratory infection?               Person with Symptom- __________NA_________________  a. Fever                                                                          Yes___ No___          ___________________  b. Shortness of breath                                                    Yes___ No___  ___________________ c. Cough/congestion                                       Yes___  No___         ___________________ d. Body aches/pains                                                          Yes___ No___        ____________________ e. Gastrointestinal symptoms (diarrhea, nausea)           Yes___ No___        ____________________  2. Within the past 14 days, has anyone living in the home had any contact with someone with or under investigation for COVID-19?    Yes___ No_X_   Person __________________   

## 2020-02-05 NOTE — Addendum Note (Signed)
Addended by: Irean Hong on: 02/05/2020 01:23 PM   Modules accepted: Orders

## 2020-02-05 NOTE — Progress Notes (Addendum)
Per patient's request, will refill OxyContin 30 mg every 8 hours (#90) and oxycodone 62m (1-1.5 tablets) every 3 hours as needed for BTP (#90).  PMDP reviewed.

## 2020-02-06 ENCOUNTER — Emergency Department
Admission: EM | Admit: 2020-02-06 | Discharge: 2020-02-06 | Disposition: A | Discharge status: Home / Self Care | Payer: Medicaid Other | Attending: Emergency Medicine | Admitting: Emergency Medicine

## 2020-02-06 ENCOUNTER — Inpatient Hospital Stay: Payer: Medicaid Other

## 2020-02-06 ENCOUNTER — Other Ambulatory Visit: Payer: Self-pay

## 2020-02-06 ENCOUNTER — Inpatient Hospital Stay: Payer: Medicaid Other | Admitting: Internal Medicine

## 2020-02-06 ENCOUNTER — Encounter: Payer: Self-pay | Admitting: Emergency Medicine

## 2020-02-06 DIAGNOSIS — Z5321 Procedure and treatment not carried out due to patient leaving prior to being seen by health care provider: Secondary | ICD-10-CM | POA: Insufficient documentation

## 2020-02-06 DIAGNOSIS — R0789 Other chest pain: Secondary | ICD-10-CM | POA: Insufficient documentation

## 2020-02-06 DIAGNOSIS — R109 Unspecified abdominal pain: Secondary | ICD-10-CM | POA: Diagnosis not present

## 2020-02-06 LAB — COMPREHENSIVE METABOLIC PANEL
ALT: 13 U/L (ref 0–44)
AST: 27 U/L (ref 15–41)
Albumin: 3.3 g/dL — ABNORMAL LOW (ref 3.5–5.0)
Alkaline Phosphatase: 129 U/L — ABNORMAL HIGH (ref 38–126)
Anion gap: 11 (ref 5–15)
BUN: 9 mg/dL (ref 6–20)
CO2: 25 mmol/L (ref 22–32)
Calcium: 8.9 mg/dL (ref 8.9–10.3)
Chloride: 101 mmol/L (ref 98–111)
Creatinine, Ser: 0.66 mg/dL (ref 0.44–1.00)
GFR calc Af Amer: >60 mL/min (ref >60)
GFR calc non Af Amer: >60 mL/min (ref >60)
Glucose, Bld: 141 mg/dL — ABNORMAL HIGH (ref 70–99)
Potassium: 3.5 mmol/L (ref 3.5–5.1)
Sodium: 137 mmol/L (ref 135–145)
Total Bilirubin: 0.8 mg/dL (ref 0.3–1.2)
Total Protein: 7 g/dL (ref 6.5–8.1)

## 2020-02-06 LAB — CBC
HCT: 35 % — ABNORMAL LOW (ref 36.0–46.0)
Hemoglobin: 11.5 g/dL — ABNORMAL LOW (ref 12.0–15.0)
MCH: 30.8 pg (ref 26.0–34.0)
MCHC: 32.9 g/dL (ref 30.0–36.0)
MCV: 93.8 fL (ref 80.0–100.0)
Platelets: 188 10*3/uL (ref 150–400)
RBC: 3.73 MIL/uL — ABNORMAL LOW (ref 3.87–5.11)
RDW: 13.7 % (ref 11.5–15.5)
WBC: 4.8 10*3/uL (ref 4.0–10.5)
nRBC: 0 % (ref 0.0–0.2)

## 2020-02-06 LAB — TROPONIN I (HIGH SENSITIVITY): Troponin I (High Sensitivity): 11 ng/L (ref <18)

## 2020-02-06 LAB — LIPASE, BLOOD: Lipase: 19 U/L (ref 11–51)

## 2020-02-06 MED ORDER — SODIUM CHLORIDE 0.9% FLUSH: 3.0000 mL | Freq: Once | INTRAVENOUS | Status: DC

## 2020-02-06 NOTE — ED Notes (Signed)
FIRST NURSE NOTE- arrived EMS for abd pain and CP.  50 mcg fentanyl and 4 mg zofran given by EMS.  VSS with EMS. Pt seen here for same recently.

## 2020-02-06 NOTE — ED Notes (Signed)
Pt reports wanting to leave. Alert and oriented. Sister on way to get pt.

## 2020-02-06 NOTE — ED Triage Notes (Signed)
Pt to ED via POV c/o chest and abd pain. Pt states that her pain has not gotten any better since she was here on 4/19. Pt states that her pain in her LLQ of her abd. Pt was given Fentanyl 50 mcg and Zofran 4 mg by EMS in route. Pt has 22 G IV in her hand. VSS per EMS.

## 2020-02-09 ENCOUNTER — Other Ambulatory Visit (INDEPENDENT_AMBULATORY_CARE_PROVIDER_SITE_OTHER): Payer: Self-pay | Admitting: Vascular Surgery

## 2020-02-09 DIAGNOSIS — Z86718 Personal history of other venous thrombosis and embolism: Secondary | ICD-10-CM

## 2020-02-09 DIAGNOSIS — D6851 Activated protein C resistance: Secondary | ICD-10-CM

## 2020-02-09 DIAGNOSIS — Z95828 Presence of other vascular implants and grafts: Secondary | ICD-10-CM

## 2020-02-10 ENCOUNTER — Other Ambulatory Visit: Payer: Self-pay

## 2020-02-10 ENCOUNTER — Inpatient Hospital Stay: Payer: Medicaid Other

## 2020-02-10 ENCOUNTER — Inpatient Hospital Stay (HOSPITAL_BASED_OUTPATIENT_CLINIC_OR_DEPARTMENT_OTHER): Payer: Medicaid Other | Admitting: Internal Medicine

## 2020-02-10 DIAGNOSIS — C538 Malignant neoplasm of overlapping sites of cervix uteri: Secondary | ICD-10-CM

## 2020-02-10 DIAGNOSIS — C539 Malignant neoplasm of cervix uteri, unspecified: Secondary | ICD-10-CM | POA: Diagnosis not present

## 2020-02-10 LAB — COMPREHENSIVE METABOLIC PANEL
ALT: 22 U/L (ref 0–44)
AST: 30 U/L (ref 15–41)
Albumin: 3.2 g/dL — ABNORMAL LOW (ref 3.5–5.0)
Alkaline Phosphatase: 151 U/L — ABNORMAL HIGH (ref 38–126)
Anion gap: 10 (ref 5–15)
BUN: 11 mg/dL (ref 6–20)
CO2: 27 mmol/L (ref 22–32)
Calcium: 8.6 mg/dL — ABNORMAL LOW (ref 8.9–10.3)
Chloride: 99 mmol/L (ref 98–111)
Creatinine, Ser: 0.63 mg/dL (ref 0.44–1.00)
GFR calc Af Amer: >60 mL/min (ref >60)
GFR calc non Af Amer: >60 mL/min (ref >60)
Glucose, Bld: 107 mg/dL — ABNORMAL HIGH (ref 70–99)
Potassium: 4 mmol/L (ref 3.5–5.1)
Sodium: 136 mmol/L (ref 135–145)
Total Bilirubin: 0.4 mg/dL (ref 0.3–1.2)
Total Protein: 7.1 g/dL (ref 6.5–8.1)

## 2020-02-10 LAB — MAGNESIUM: Magnesium: 1.6 mg/dL — ABNORMAL LOW (ref 1.7–2.4)

## 2020-02-10 LAB — CBC WITH DIFFERENTIAL/PLATELET
Abs Immature Granulocytes: 0.01 10*3/uL (ref 0.00–0.07)
Basophils Absolute: 0 10*3/uL (ref 0.0–0.1)
Basophils Relative: 1 %
Eosinophils Absolute: 0.1 10*3/uL (ref 0.0–0.5)
Eosinophils Relative: 2 %
HCT: 35.5 % — ABNORMAL LOW (ref 36.0–46.0)
Hemoglobin: 11.3 g/dL — ABNORMAL LOW (ref 12.0–15.0)
Immature Granulocytes: 0 %
Lymphocytes Relative: 26 %
Lymphs Abs: 1.2 10*3/uL (ref 0.7–4.0)
MCH: 30.9 pg (ref 26.0–34.0)
MCHC: 31.8 g/dL (ref 30.0–36.0)
MCV: 97 fL (ref 80.0–100.0)
Monocytes Absolute: 0.3 10*3/uL (ref 0.1–1.0)
Monocytes Relative: 6 %
Neutro Abs: 2.8 10*3/uL (ref 1.7–7.7)
Neutrophils Relative %: 65 %
Platelets: 179 10*3/uL (ref 150–400)
RBC: 3.66 MIL/uL — ABNORMAL LOW (ref 3.87–5.11)
RDW: 13.8 % (ref 11.5–15.5)
WBC: 4.4 10*3/uL (ref 4.0–10.5)
nRBC: 0 % (ref 0.0–0.2)

## 2020-02-10 MED ORDER — RIVAROXABAN 20 MG PO TABS: 20.0000 mg | ORAL_TABLET | Freq: Every day | ORAL | 1 refills | Status: AC

## 2020-02-10 MED ORDER — SODIUM CHLORIDE 0.9 % IV SOLN
Freq: Once | INTRAVENOUS | Status: AC
  Filled 2020-02-10: qty 250

## 2020-02-10 MED ORDER — MAGNESIUM SULFATE 4 GM/100ML IV SOLN
4.0000 g | Freq: Once | INTRAVENOUS | Status: AC
  Administered 2020-02-10: 4 g via INTRAVENOUS
  Filled 2020-02-10: qty 100

## 2020-02-10 MED ORDER — HEPARIN SOD (PORK) LOCK FLUSH 100 UNIT/ML IV SOLN
500.0000 [IU] | Freq: Once | INTRAVENOUS | Status: AC | PRN
  Administered 2020-02-10: 500 [IU]
  Filled 2020-02-10: qty 5

## 2020-02-10 NOTE — Assessment & Plan Note (Addendum)
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 Chemotherapy currently on hold because of right knee surgery [see below]/in general patient's poor tolerance to therapy.    #Given patient's poor quality of life on chemotherapy-agree with holding therapy at this time.  Continue the supportive care.  If patient's clinical condition declines would recommend hospice./See below  #Right knee pain swelling from hematoma post surgery- STABLE;  Continue pain management as per palliative care.  #History of PE DVT factor V Leiden heterozygosity-anticoagulation is on hold because of right knee hematoma.  Patient s/p IVC filter placement.Improved-restart Xarelto.  Recommend holding off explantation of IVC filter given patient's overall limited survival.  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  Stable  # Adrenal insufficiency-on hydrocortisone 20 mg/day; hydrocortisone in PM.  Stable  # Hypomagnesemia-secondary platinum chemotherapy-mag 1.6- STABLE; mag infusion today.  #Poor prognosis-s/p evaluation at hospice at home.  For now patient clinically stable; hospice will follow up as needed  # DISPOSITION:  # Mag IV infusions today; #  Follow up in 3 weeks-MD; labs-cbc/cmp/mag; MD; no chemo; possible mag IV 2 hour. Dr.B

## 2020-02-10 NOTE — Progress Notes (Signed)
[place] NOTE  Patient Care Team: Towanda Malkin, MD as PCP - General (Internal Medicine) Mellody Drown, MD as Consulting Physician (Obstetrics and Gynecology) [name], MD as Consulting Physician (Gastroenterology) Michael Boston, MD as Consulting Physician (General Surgery) Lovell Sheehan, MD as Consulting Physician (Orthopedic Surgery) Cammie Sickle, MD as Consulting Physician (Oncology) Earnestine Leys, MD as Consulting Physician (Orthopedic Surgery) Jason Coop, NP as Nurse Practitioner Borders, Kirt Boys, NP as Nurse Practitioner (Hospice and Palliative Medicine)  CHIEF COMPLAINTS/PURPOSE OF CONSULTATION: Cervical cancer  #  Oncology History Overview Note  # dec 2017- CERVICAL ADENO CA [[place]]; Stage IB [Dr. Christene Slates at [facility] in [place], [place];No surgery- Chemo-RT;   # 2018-sep - lung nodules [in [place]]  # AUG 20th, 2019-#1 Carbo-Taxol s/p 6 cycles- [No avastin sec to blood clots]: Feb 2020-bilateral lung nodules with 1 cm in size. March 2020- finished carbo-taxol #7; poor tolerance to [place].  # April 15 th 2020- Start Taxol weekly x 3; one week OFF;  # July 2020-CT- bil cavitary lesions- slightly bigger by few mm/overall STABLE; CT a/p-NED. 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  She received treatment from 11/2016-05/2017.  01/2017 cisplatin x 2 and  carboplatin x 1 (01/29/2017) due to ARF and XRT.  XRT was followed by T & O on 02/01/2017 and T & N 02/10/2017 and 02/20/2017.  Course was complicated by 80 pound weight loss, nausea, vomiting, electrolyte wasting (potassium and magnesium).  She describes that.  Is been sick constantly requiring at least 20 hospitalizations.  Follow-up CT chest and PET on 06/2017. Per patient, 'radiation worked' and no disease in the abdomen.  At that time she was noted to have lung nodules that were growing and follow-up imaging was scheduled for 10/2017.  She was admitted to hospital in [place] for small bowel obstruction which was managed conservatively and she was home for a week prior to traveling to [place] for Thanksgiving holiday where she has family.  She presented to ER in [place] on 08/2017 with nausea, vomiting, and lower abdominal pain.  Symptoms did not respond to conservative treatment.   CT on 08/26/2017 revealed small bowel obstruction with transition in the pelvis just superior to the uterus rather was a long segment of distal ileum with fatty wall thickening compatible with chronic inflammation and/or radiation enteritis. Imaging showed numerous pulmonary nodules consistent with metastatic disease. She underwent laparotomy and right ileocolectomy on 08/31/2017 at [facility].  Surgical findings revealed a thickened, matted, and scarred piece of distal small bowel close to the ileocecal valve.  She was discharged on 09/05/2017.  Pain markedly increased in intensity and imaging was performed on 09/11/2017 which revealed: Debris within the anterior abdominal wall incision concerning for infection versus packing material, s/p post ileo-colectomy with expected postoperative changes, mild colonic ileus, numerous pulmonary nodules highly concerning for metastatic disease, punctate nonobstructing nephrolithiasis.  Staples were removed and one was packed.  She was started on  doxycycline.  Abdominal and  pelvic CT without contrast on 09/11/2017 revealed debris within anterior abdominal wall incision concerning for infection, versus packing material.   She is s/p ileocolectomy with expected postoperative changes and mild colonic ileus.  There were numerous pulmonary nodules highly concerning for metastatic disease and punctate nonobstructing nephrolithiasis. She was readmitted on 09/12/2017.  She describes the onset of lower abdominal pain on 09/09/2017.  Pain markedly increased in intensity on 09/11/2017.   Staples were removed and the wound packed.  She was started on doxycycline.  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   PET scan was ordered to evaluate enlarging lung nodules with concern for recurrent cervical cancer but scan was delayed due to insurance and need to be performed in [place].  Presented to ER on 12/03/2017 for abdominal pain and emesis.  Imaging concerning for worsening possible uterine infarct and she was admitted to hospital.  Pelvic MRI was unremarkable.  Remote scarring type changes of uterus thought to be possibly related to radiation.  She was discharged on 12/08/2017.  Underwent endoscopy and colonoscopy on 12/20/2017.    On 02/22/2018 she underwent laser ablation of condylomata around the anus and vagina under anesthesia with Dr. Johney Maine.   04/15/2018:  Chest, abdomen, and pelvis CT revealed innumerable (> 100) cavitary nodules scattered in the lungs, moderately enlarging compared to the 11/08/2017 PET-CT, suspicious for metastatic disease.  One index node in the RLL measures 1.0 x 1.1 cm  (previously 0.6 x 0.6 cm).  There were no new nodules.  There was an ill-defined wall thickening in the rectosigmoid with surrounding stranding along fascia planes, probably sequela from prior radiation therapy.  There was multilevel lumbar impingement due to spondylosis and degenerative disc disease.  There was heterogeneous enhancement in the uterus (some possibly from prior radiation therapy).   04/23/2018:  PET scan revealed numerous scattered solid and cavitary nodules in the lungs stable increased in size compared to the prior PET-CT from 11/08/2017. Largest nodule was 1.1 cm in the LUL (SUV 1.9).  These demonstrated low-grade metabolic activity up to a maximum SUV of about 2.3, increased from 11/08/2017.    Case was discussed at tumor board on 04/25/2018. Consensus to pursue CT-guided biopsy (05/06/18) which revealed: Metastatic adenocarcinoma, morphologically consistent with cervical adenocarcinoma.  She has history of chronic hepatitis C which is managed by GI.  Hepatitis C genotype is 2a/2c.  She receives B12 injections for history of B12 deficiency.  On 04/24/2018 she underwent left total knee replacement with Dr. [name]. --------------------------------------------------------   DIAGNOSIS: Cervical adenocarcinoma  STAGE: IV        ;GOALS: Palliative  CURRENT/MOST RECENT THERAPY : Alimta [C]    Malignant neoplasm of overlapping sites of cervix ([place])  01/29/2019 - 04/23/2019 Chemotherapy   The patient had PACLitaxel (TAXOL) 156 mg in sodium chloride 0.9 % 250 mL chemo infusion (</= 24m/m2), 80 mg/m2 = 156 mg, Intravenous,  Once, 3 of 4 cycles Dose modification: 65 mg/m2 (original dose 80 mg/m2, Cycle 1, Reason: Provider Judgment) Administration: 156 mg (01/29/2019), 156 mg (02/05/2019), 126 mg (02/12/2019), 126 mg (02/26/2019), 126 mg (03/13/2019), 126 mg (03/27/2019), 126 mg (03/20/2019),  126 mg (04/03/2019), 126 mg (04/10/2019)  for chemotherapy treatment.    05/08/2019 - 07/30/2019 Chemotherapy    The patient had pembrolizumab (KEYTRUDA) 200 mg in sodium chloride 0.9 % 50 mL chemo infusion, 200 mg, Intravenous, Once, 4 of 6 cycles Administration: 200 mg (05/08/2019), 200 mg (05/29/2019), 200 mg (06/19/2019), 200 mg (07/10/2019)  for chemotherapy treatment.    08/07/2019 -  Chemotherapy   The patient had pegfilgrastim-cbqv [facility]) injection 6 mg, 6 mg, Subcutaneous, Once, 3 of 5 cycles Administration: 6 mg (08/29/2019), 6 mg (09/19/2019) PEMEtrexed (ALIMTA) 1,000 mg in sodium chloride 0.9 % 100 mL chemo infusion, 950 mg, Intravenous,  Once, 4 of 6 cycles Administration: 1,000 mg (08/07/2019), 1,000 mg (09/18/2019), 1,000 mg (11/13/2019), 1,000 mg (08/28/2019)  for chemotherapy treatment.       HISTORY OF PRESENTING ILLNESS:  Joanna Hall 54 y.o.  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  Chemotherapy is on hold because of recent right knee surgery/hematoma.  Patient has been evaluated by hospice at home.  Patient continues to complain of weight loss.  Continues to complain of intermittent diarrhea associate with nausea vomiting.  Patient's diarrhea is alternating constipation.  Continues to have abdominal pain knee pain left shoulder pain.  She has not started anticoagulation yet.  Right knee hematoma overall improved.   Review of Systems  Constitutional: Positive for malaise/fatigue and weight loss. Negative for chills, diaphoresis and fever.  HENT: Negative for nosebleeds and sore throat.   Eyes: Negative for double vision.  Respiratory: Negative for cough, hemoptysis, sputum production, shortness of breath and wheezing.   Cardiovascular: Negative for chest pain, palpitations, orthopnea and leg swelling.  Gastrointestinal: Positive for abdominal pain, constipation and diarrhea. Negative for blood in stool, heartburn, melena, nausea and vomiting.   Genitourinary: Negative for dysuria, frequency and urgency.  Musculoskeletal: Positive for back pain, joint pain and myalgias.  Skin: Negative.  Negative for itching and rash.  Neurological: Positive for tingling. Negative for dizziness, focal weakness, weakness and headaches.  Endo/Heme/Allergies: Does not bruise/bleed easily.  Psychiatric/Behavioral: Negative for depression. The patient is not nervous/anxious and does not have insomnia.      MEDICAL HISTORY:  Past Medical History:  Diagnosis Date  . Abdominal pain 06/10/2018  . Abnormal cervical Papanicolaou smear 09/18/2017  . Anxiety   . Aortic atherosclerosis ([place])   . Arthritis    neck and knees  . 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 Blood transfusion without reported diagnosis   . Cervical cancer ([place]) 09/2016   mets lung  . Chronic anal fissure   . Chronic diarrhea   . Dyspnea   . Erosive gastropathy 09/18/2017  . Factor V Leiden mutation ([place])   . Fecal incontinence   . Genital warts   . GERD (gastroesophageal reflux disease)   . GI bleed 10/08/2018  . Heart murmur   . Hematochezia   . Hemorrhoids   . 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 Hepatitis, chronic ([place]) 05/05/2017  . History of cancer chemotherapy    completed 06/2017  . History of Clostridium difficile infection    while undergoing chemo.  Negative test 10/2017  . Ileocolic anastomotic leak   . 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 Intestinal infection due to  Clostridium difficile 09/18/2017  . Macrocytic anemia with vitamin B12 deficiency   . Multiple gastric ulcers   . Nausea vomiting and diarrhea   . Pancolitis ([place]) 07/27/2018  . Perianal condylomata   . Pneumonia    History of  . Pulmonary nodules   . Rectal bleeding   . Small bowel obstruction ([place]) 08/2017  . Stiff neck    limited right turn  . Vitamin D deficiency     SURGICAL HISTORY: Past Surgical History:  Procedure  Laterality Date  . CHOLECYSTECTOMY    . COLON SURGERY  08/2017   resection  . COLONOSCOPY WITH PROPOFOL N/A 12/20/2017   Procedure: COLONOSCOPY WITH PROPOFOL;  Surgeon: [name], MD;  Location: [facility] ENDOSCOPY;  Service: Gastroenterology;  Laterality: N/A;  . COLONOSCOPY WITH PROPOFOL N/A 07/30/2018   Procedure: COLONOSCOPY WITH PROPOFOL;  Surgeon: [name], MD;  Location: [facility] ENDOSCOPY;  Service: Gastroenterology;  Laterality: N/A;  . COLONOSCOPY WITH PROPOFOL N/A 10/10/2018   Procedure: COLONOSCOPY WITH PROPOFOL;  Surgeon: Lucilla Lame, MD;  Location: [facility] ENDOSCOPY;  Service: Endoscopy;  Laterality: N/A;  . DIAGNOSTIC LAPAROSCOPY    . ESOPHAGOGASTRODUODENOSCOPY (EGD) WITH PROPOFOL N/A 12/20/2017   Procedure: ESOPHAGOGASTRODUODENOSCOPY (EGD) WITH PROPOFOL;  Surgeon: [name], MD;  Location: [place];  Service: Gastroenterology;  Laterality: N/A;  . ESOPHAGOGASTRODUODENOSCOPY (EGD) WITH PROPOFOL  07/30/2018   Procedure: ESOPHAGOGASTRODUODENOSCOPY (EGD) WITH PROPOFOL;  Surgeon: [name], MD;  Location: [facility] ENDOSCOPY;  Service: Gastroenterology;;  . Otho Darner SIGMOIDOSCOPY N/A 11/21/2018   Procedure: FLEXIBLE SIGMOIDOSCOPY;  Surgeon: [name], MD;  Location[facility] ENDOSCOPY;  Service: Gastroenterology;  Laterality: N/A;  . IVC FILTER INSERTION N/A 01/01/2020   Procedure: IVC FILTER INSERTION;  Surgeon: Algernon Huxley, MD;  Location: [place] CV LAB;  Service: Cardiovascular;  Laterality: N/A;  . LAPAROTOMY N/A 08/31/2017   Procedure: EXPLORATORY LAPAROTOMY for SBO, ileocolectomy, removal of piece of uterine wall;  Surgeon: [name], MD;  Location: [facility] ORS;  Service: General;  Laterality: N/A;  . LASER ABLATION CONDOLAMATA N/A 02/22/2018   Procedure: LASER ABLATION/REMOVAL OF OZHYQMVHQIO AROUND ANUS AND VAGINA;  Surgeon: Michael Boston, MD;  Location: [place];  Service: General;  Laterality: N/A;  . OOPHORECTOMY    .  PORTA CATH INSERTION N/A 05/13/2018   Procedure: PORTA CATH INSERTION;  Surgeon: Katha Cabal, MD;  Location: [place] CV LAB;  Service: Cardiovascular;  Laterality: N/A;  . SMALL INTESTINE SURGERY    . TANDEM RING INSERTION     x3  . THORACOTOMY    . TOTAL KNEE ARTHROPLASTY Left 04/24/2018   Procedure: TOTAL KNEE ARTHROPLASTY;  Surgeon: Lovell Sheehan, MD;  Location: [facility] ORS;  Service: Orthopedics;  Laterality: Left;  . TOTAL KNEE ARTHROPLASTY Right 12/24/2019   Procedure: RIGHT TOTAL KNEE ARTHROPLASTY;  Surgeon: Lovell Sheehan, MD;  Location: [facility] ORS;  Service: Orthopedics;  Laterality: Right;    SOCIAL HISTORY: Social History   Socioeconomic History  . Marital status: Divorced    Spouse name: Not on file  . Number of children: Not on file  . Years of education: Not on file  . Highest education level: Not on file  Occupational History  . Not on file  Tobacco Use  . Smoking status: Former Smoker    Packs/day: 0.25    Years: 10.00    Pack years: 2.50    Types: Cigarettes    Quit date: 10/16/2006    Years since quitting: 13.3  .  Smokeless tobacco: Never Used  Substance and Sexual Activity  . Alcohol use: Not Currently    Comment: seldom  . Drug use: Yes    Types: Marijuana    Comment: not very often  . Sexual activity: Not Currently    Birth control/protection: Post-menopausal    Comment: Not Asked  Other Topics Concern  . Not on file  Social History Narrative  . Not on file   Social Determinants of Health   Financial Resource Strain:   . Difficulty of Paying Living Expenses:   Food Insecurity:   . Worried About Charity fundraiser in the Last Year:   . Arboriculturist in the Last Year:   Transportation Needs:   . Film/video editor (Medical):   Marland Kitchen Lack of Transportation (Non-Medical):   Physical Activity:   . Days of Exercise per Week:   . Minutes of Exercise per Session:   Stress:   . Feeling of Stress :   Social Connections:   . Frequency  of Communication with Friends and Family:   . Frequency of Social Gatherings with Friends and Family:   . Attends Religious Services:   . Active Member of Clubs or Organizations:   . Attends Archivist Meetings:   Marland Kitchen Marital Status:   Intimate Partner Violence:   . Fear of Current or Ex-Partner:   . Emotionally Abused:   Marland Kitchen Physically Abused:   . Sexually Abused:     FAMILY HISTORY: Family History  Problem Relation Age of Onset  . Hypertension Mother   . Hypertension Father   . Diabetes Father   . Hyperlipidemia Father   . Alcohol abuse Daughter   . Hypertension Maternal Grandmother   . Diabetes Maternal Grandmother   . Diabetes Paternal Grandmother   . Hypertension Paternal Grandmother     ALLERGIES:  is allergic to ketamine.  MEDICATIONS:  Current Outpatient Medications  Medication Sig Dispense Refill  . amitriptyline (ELAVIL) 100 MG tablet Take 1 tablet (100 mg total) by mouth at bedtime. 90 tablet 1  . diphenoxylate-atropine (LOMOTIL) 2.5-0.025 MG tablet TAKE (2) TABLETS FOUR TIMES DAILY. 240 tablet 0  . docusate sodium (COLACE) 100 MG capsule Take 1 capsule (100 mg total) by mouth 2 (two) times daily. (Patient not taking: Reported on 02/02/2020) 10 capsule 0  . famotidine (PEPCID) 20 MG tablet Take 1 tablet (20 mg total) by mouth 2 (two) times daily. (Patient taking differently: Take 20 mg by mouth daily as needed for heartburn. ) 60 tablet 0  . folic acid (FOLVITE) 1 MG tablet Take 1 tablet (1 mg total) by mouth daily. 30 tablet 6  . hydrOXYzine (VISTARIL) 25 MG capsule Take 1 capsule (25 mg total) by mouth 3 (three) times daily as needed for itching. 30 capsule 0  . hyoscyamine (LEVBID) 0.375 MG 12 hr tablet Take 1 tablet (0.375 mg total) by mouth 2 (two) times daily. 60 tablet 0  . methocarbamol (ROBAXIN) 500 MG tablet Take 1 tablet (500 mg total) by mouth every 6 (six) hours as needed for muscle spasms. 40 tablet 2  . Multiple Vitamins-Minerals (SUPER THERA  VITE M) TABS Take 1 tablet by mouth daily.     . ondansetron (ZOFRAN ODT) 4 MG disintegrating tablet Take 1 tablet (4 mg total) by mouth every 6 (six) hours as needed for nausea or vomiting. 20 tablet 0  . oxyCODONE (ROXICODONE) 15 MG immediate release tablet Take 1-1.5 tablets (15-22.5 mg total) by mouth every 3 (  three) hours as needed (for breakthrough pain). 90 tablet 0  . oxyCODONE 30 MG 12 hr tablet Take 1 tablet (30 mg total) by mouth every 8 (eight) hours. 90 tablet 0  . pantoprazole (PROTONIX) 40 MG tablet Take 1 tablet (40 mg total) by mouth daily. 90 tablet 3  . promethazine (PHENERGAN) 25 MG tablet Take 1 tablet (25 mg total) by mouth every 4 (four) hours as needed for nausea or vomiting. 30 tablet 1  . promethazine (PHENERGAN) 25 MG tablet Take 1 tablet (25 mg total) by mouth every 8 (eight) hours as needed for nausea or vomiting. 30 tablet 0  . rivaroxaban (XARELTO) 20 MG TABS tablet Take 1 tablet (20 mg total) by mouth daily with supper. 60 tablet 1  . sertraline (ZOLOFT) 25 MG tablet Take 1 tablet (25 mg total) by mouth daily. (Patient not taking: Reported on 02/02/2020) 30 tablet 0  . triamcinolone ointment (KENALOG) 0.5 % Apply 1 application topically 2 (two) times daily. (Patient taking differently: Apply 1 application topically 2 (two) times daily as needed (itching). ) 30 g 0  . VENTOLIN HFA 108 (90 Base) MCG/ACT inhaler Inhale 1-2 puffs into the lungs every 4 (four) hours as needed for shortness of breath. 1 Inhaler 1   No current facility-administered medications for this visit.   Facility-Administered Medications Ordered in Other Visits  Medication Dose Route Frequency Provider Last Rate Last Admin  . heparin lock flush 100 unit/mL  500 Units Intravenous Once Cor[place]an, Melissa C, MD      . sodium chloride 0.9 % 1,000 mL with potassium chloride 20 mEq infusion   Intravenous Once Honor Loh E, NP      . sodium chloride flush (NS) 0.9 % injection 10 mL  10 mL Intravenous Once  Borders, Kirt Boys, NP          .  PHYSICAL EXAMINATION: ECOG PERFORMANCE STATUS: 1 - Symptomatic but completely ambulatory  Vitals:   02/10/20 0830  BP: 107/76  Pulse: 85  Resp: 20  Temp: (!) 96 F (35.6 C)   Filed Weights   02/10/20 0835  Weight: 149 lb (67.6 kg)    Physical Exam  Constitutional: She is oriented to person, place, and time and well-developed, well-nourished, and in no distress.  Alone.  Patient in wheelchair.  HENT:  Head: Normocephalic and atraumatic.  Mouth/Throat: Oropharynx is clear and moist. No oropharyngeal exudate.  Eyes: Pupils are equal, round, and reactive to light.  Cardiovascular: Normal rate and regular rhythm.  Pulmonary/Chest: Effort normal and breath sounds normal. No respiratory distress. She has no wheezes.  Abdominal: Soft. Bowel sounds are normal. She exhibits no distension and no mass. There is no rebound and no guarding.  Musculoskeletal:        General: No tenderness or edema. Normal range of motion.     Cervical back: Normal range of motion and neck supple.     Comments: Swelling noted of the right knee/warmth noted.  Neurological: She is alert and oriented to person, place, and time.  Skin: Skin is warm.  Psychiatric: Affect normal.   LABORATORY DATA:  I have reviewed the data as listed Lab Results  Component Value Date   WBC 4.4 02/10/2020   HGB 11.3 (L) 02/10/2020   HCT 35.5 (L) 02/10/2020   MCV 97.0 02/10/2020   PLT 179 02/10/2020   Recent Labs    09/25/19 1338 09/26/19 0556 02/02/20 0837 02/06/20 1620 02/10/20 0809  NA  --    < >  134* 137 136  K  --    < > 3.3* 3.5 4.0  CL  --    < > 100 101 99  CO2  --    < > 23 25 27   GLUCOSE  --    < > 112* 141* 107*  BUN  --    < > 8 9 11   CREATININE  --    < > 0.60 0.66 0.63  CALCIUM  --    < > 9.0 8.9 8.6*  GFRNONAA  --    < > >60 >60 >60  GFRAA  --    < > >60 >60 >60  PROT 7.4   < > 7.2 7.0 7.1  ALBUMIN 3.5   < > 3.0* 3.3* 3.2*  AST 83*   < > 19 27 30   ALT 134*    < > 10 13 22   ALKPHOS 180*   < > 129* 129* 151*  BILITOT 0.6   < > 0.5 0.8 0.4  BILIDIR 0.2  --   --   --   --   IBILI 0.4  --   --   --   --    < > = values in this interval not displayed.    RADIOGRAPHIC STUDIES: I have personally reviewed the radiological images as listed and agreed with the findings in the report. No results found.  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 Chemotherapy currently on hold because of right knee surgery [see below]/in general patient's poor tolerance to therapy.    #Given patient's poor quality of life on chemotherapy-agree with holding therapy at this time.  Continue the supportive care.  If patient's clinical condition declines would recommend hospice./See below  #Right knee pain swelling from hematoma post surgery- STABLE;  Continue pain management as per palliative care.  #History of PE DVT factor V Leiden heterozygosity-anticoagulation is on hold because of right knee hematoma.  Patient s/p IVC filter placement.Improved-restart Xarelto.  Recommend holding off explantation of IVC filter given patient's overall limited survival.  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  Stable  # Adrenal insufficiency-on hydrocortisone 20 mg/day; hydrocortisone in PM.  Stable  # Hypomagnesemia-secondary platinum chemotherapy-mag 1.6- STABLE; mag infusion today.  #Poor prognosis-s/p evaluation at hospice at home.  For now patient clinically stable; hospice will follow up as needed  # DISPOSITION:  # Mag IV infusions today; #  Follow up in 3 weeks-MD; labs-cbc/cmp/mag; MD; no chemo; possible mag IV 2 hour. Dr.B     All questions were answered. The patient knows to call the clinic with  any problems, questions or concerns.    Cammie Sickle, MD 02/16/2020 10:33 PM

## 2020-02-10 NOTE — Progress Notes (Signed)
Patient reports persistent nausea

## 2020-02-13 ENCOUNTER — Ambulatory Visit (INDEPENDENT_AMBULATORY_CARE_PROVIDER_SITE_OTHER): Payer: Medicaid Other

## 2020-02-13 ENCOUNTER — Telehealth (INDEPENDENT_AMBULATORY_CARE_PROVIDER_SITE_OTHER): Payer: Self-pay

## 2020-02-13 ENCOUNTER — Ambulatory Visit (INDEPENDENT_AMBULATORY_CARE_PROVIDER_SITE_OTHER): Payer: Medicaid Other | Admitting: Vascular Surgery

## 2020-02-13 ENCOUNTER — Other Ambulatory Visit: Payer: Self-pay

## 2020-02-13 VITALS — BP 120/81 | HR 125 | Resp 16 | Wt 145.0 lb

## 2020-02-13 DIAGNOSIS — C538 Malignant neoplasm of overlapping sites of cervix uteri: Secondary | ICD-10-CM

## 2020-02-13 DIAGNOSIS — Z86718 Personal history of other venous thrombosis and embolism: Secondary | ICD-10-CM

## 2020-02-13 DIAGNOSIS — I10 Essential (primary) hypertension: Secondary | ICD-10-CM | POA: Diagnosis not present

## 2020-02-13 DIAGNOSIS — Z95828 Presence of other vascular implants and grafts: Secondary | ICD-10-CM

## 2020-02-13 DIAGNOSIS — D6851 Activated protein C resistance: Secondary | ICD-10-CM

## 2020-02-13 DIAGNOSIS — I700 Atherosclerosis of aorta: Secondary | ICD-10-CM

## 2020-02-13 NOTE — Assessment & Plan Note (Signed)
blood pressure control important in reducing the progression of atherosclerotic disease. On appropriate oral medications.  

## 2020-02-13 NOTE — Telephone Encounter (Signed)
Spoke with the patient and she is now scheduled with Dr. Lucky Cowboy for IVC filter removal on 02/23/20 with a 8:15 am arrival time to the [facility]. Patient will do covid testing on 02/19/20 between 8-1 pm at the [facility]. Pre-procedure instructions were discussed and will be mailed.

## 2020-02-13 NOTE — Assessment & Plan Note (Signed)
To evaluate her lower extremities, noninvasive studies were performed today.  No DVT or superficial thrombophlebitis was seen in either lower extremity.  She has now resumed anticoagulation and no longer needs her IVC filter.  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  For this reason, would strongly recommend filter removal.  Risks and benefits of filter removal were discussed with the patient in detail and she is agreeable to proceed.  This will be scheduled for the near future at her convenience.

## 2020-02-13 NOTE — Assessment & Plan Note (Signed)
Definitely increases her risk of thrombotic events but is now back on anticoagulation.  Would be at higher risk of IVC filter thrombosis due to her malignancy if her anticoagulation lapses.  Would be a stronger reason to remove her IVC filter.

## 2020-02-13 NOTE — Assessment & Plan Note (Signed)
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  She is now back on her anticoagulation and does not currently have a DVT.  She will maintain on anticoagulation at this point.

## 2020-02-13 NOTE — Patient Instructions (Signed)
Inferior Vena Cava Filter Removal  Inferior vena cava filter removal is a procedure to take out a metal filter that was placed into a large vein in the abdomen (inferior vena cava, IVC). An IVC filter prevents blood clots in the legs or pelvis from traveling to the heart or lungs. Some IVC filters are designed to be removed (retrievable filters). You may have your filter removed when the danger of forming blood clots has passed or when you can take blood-thinning medicine to prevent blood clots. In some cases, the filter may need to be removed because it becomes damaged, is not working, or is causing problems. Most filters can be removed through the vein (percutaneous). In the rare cases when a surgeon is unable to remove the filter percutaneously, one of these steps may be taken:  A more invasive, open surgery may be necessary.  The filter may be left in place. Tell a health care provider about:  Any allergies you have.  All medicines you are taking, including vitamins, herbs, eye drops, creams, and over-the-counter medicines.  Any problems you or family members have had with anesthetic medicines or with contrast dyes that are used during an imaging test.  Any blood disorders you have.  Any surgeries you have had.  Any medical conditions you have.  Whether you are pregnant or may be pregnant. What are the risks? Generally, this is a safe procedure. However, problems may occur, including:  Infection.  Bleeding.  Allergic reactions to medicines or dyes.  Damage to the IVC, other blood vessels, or surrounding structures.  A blood clot or a piece of the filter breaking loose and traveling to the heart or lungs. What happens before the procedure? Medicines Ask your health care provider about:  Changing or stopping your regular medicines. This is especially important if you are taking diabetes medicines or blood thinners.  Taking medicines such as aspirin and ibuprofen. These  medicines can thin your blood. Do not take these medicines unless your health care provider tells you to take them.  Taking over-the-counter medicines, vitamins, herbs, and supplements. Staying hydrated Follow instructions from your health care provider about hydration, which may include:  Up to 2 hours before the procedure - you may continue to drink clear liquids, such as water or clear fruit juice. Eating and drinking restrictions Follow instructions from your health care provider about eating and drinking, which may include:  8 hours before the procedure - stop eating heavy meals or foods such as meat, fried foods, or fatty foods.  6 hours before the procedure - stop eating light meals or foods, such as toast or cereal.  6 hours before the procedure - stop drinking milk or drinks that contain milk.  2 hours before the procedure - stop drinking clear liquids. General instructions  Plan to have someone take you home from the hospital or clinic.  Plan to have a responsible adult care for you for at least 24 hours after you leave the hospital or clinic. This is important. What happens during the procedure?  To lower your risk of infection: ? Your health care team will wash or sanitize their hands. ? Hair may be removed from the surgical area. ? Your skin will be washed with soap.  An IV will be inserted into one of your veins.  You will be given one or more of the following: ? A medicine to help you relax (sedative). ? A medicine to make you fall asleep (general anesthetic).  The  procedure will be done through a vein in your groin or neck that leads to the IVC. Your health care provider will inject a numbing medicine (local anesthetic) into the skin over the vein that will be used.  A small incision will be made over the vein.  A long, thin tube (catheter) will be inserted into the vein.  The catheter will be moved through your vein and into your IVC. X-rays may be done to  help guide the catheter into place. Dye may be injected through the catheter before the X-rays to make the catheter and filter easier to see.  When the catheter reaches the filter, a hook (snare) on the end of the catheter may be used to latch onto the filter. In some cases, a grasping instrument (forceps) may be threaded through the catheter to gently grab and remove the filter instead.  After the filter has been hooked or grasped, the filter and instruments will be pulled out through the catheter.  The catheter will be removed through the incision in your skin.  Pressure will be placed over your incision until bleeding stops.  A bandage (dressing) will be placed over your incision. The procedure may vary among health care providers and hospitals. What happens after the procedure?  Your blood pressure, heart rate, breathing rate, and blood oxygen level will be monitored until the medicines you were given have worn off.  Do not drive for 24 hours if you were given a sedative during your procedure.  You may need to stay in bed (be on bed rest) for a period of time. Summary  Inferior vena cava (IVC) filter removal is a procedure to take out a filter that was placed to prevent blood clots from traveling to your heart or lungs.  You may have your filter removed when the danger of forming blood clots has passed or when you can take blood-thinning medicines to prevent blood clots. In some cases, a filter is removed because there is a problem with it.  The removal procedure is similar to the procedure that was used to insert the filter. A long, thin tube (catheter) will be inserted through a vein in your groin or neck. Then, the filter will be gently grasped and pulled out through the catheter.  Plan to have a responsible adult care for you for at least 24 hours after you leave the hospital or clinic. This is important. This information is not intended to replace advice given to you by your  health care provider. Make sure you discuss any questions you have with your health care provider. Document Revised: 09/14/2017 Document Reviewed: 04/17/2017 [employer] Patient Education  [place].

## 2020-02-13 NOTE — Progress Notes (Signed)
MRN : [id number]  Joanna Hall is a 54 y.o. ([birth year]) female who presents with chief complaint of  Chief Complaint  Patient presents with  . Follow-up    [facility] 6wk post ivc filter  .  History of Present Illness: Patient returns today in follow up of her recently placed IVC filter.  This was done about 6 weeks ago when she had a significant right leg hematoma and her anticoagulation had to be held.  She just resume that last week.  Her hematoma is improved and she has mild to moderate right lower extremity swelling at this point.  She is tolerating her blood thinners without hematuria, GI bleed, or other signs of bleeding at this point.  No change in her leg after initiation of anticoagulation this time. To evaluate her lower extremities, noninvasive studies were performed today.  No DVT or superficial thrombophlebitis was seen in either lower extremity.  Current Outpatient Medications  Medication Sig Dispense Refill  . diphenoxylate-atropine (LOMOTIL) 2.5-0.025 MG tablet TAKE (2) TABLETS FOUR TIMES DAILY. 240 tablet 0  . famotidine (PEPCID) 20 MG tablet Take 1 tablet (20 mg total) by mouth 2 (two) times daily. (Patient taking differently: Take 20 mg by mouth daily as needed for heartburn. ) 60 tablet 0  . folic acid (FOLVITE) 1 MG tablet Take 1 tablet (1 mg total) by mouth daily. 30 tablet 6  . hydrOXYzine (VISTARIL) 25 MG capsule Take 1 capsule (25 mg total) by mouth 3 (three) times daily as needed for itching. 30 capsule 0  . hyoscyamine (LEVBID) 0.375 MG 12 hr tablet Take 1 tablet (0.375 mg total) by mouth 2 (two) times daily. 60 tablet 0  . methocarbamol (ROBAXIN) 500 MG tablet Take 1 tablet (500 mg total) by mouth every 6 (six) hours as needed for muscle spasms. 40 tablet 2  . Multiple Vitamins-Minerals (SUPER THERA VITE M) TABS Take 1 tablet by mouth daily.     . ondansetron (ZOFRAN ODT) 4 MG disintegrating tablet Take 1 tablet (4 mg total) by mouth every 6 (six) hours as  needed for nausea or vomiting. 20 tablet 0  . oxyCODONE (ROXICODONE) 15 MG immediate release tablet Take 1-1.5 tablets (15-22.5 mg total) by mouth every 3 (three) hours as needed (for breakthrough pain). 90 tablet 0  . oxyCODONE 30 MG 12 hr tablet Take 1 tablet (30 mg total) by mouth every 8 (eight) hours. 90 tablet 0  . pantoprazole (PROTONIX) 40 MG tablet Take 1 tablet (40 mg total) by mouth daily. 90 tablet 3  . promethazine (PHENERGAN) 25 MG tablet Take 1 tablet (25 mg total) by mouth every 4 (four) hours as needed for nausea or vomiting. 30 tablet 1  . promethazine (PHENERGAN) 25 MG tablet Take 1 tablet (25 mg total) by mouth every 8 (eight) hours as needed for nausea or vomiting. 30 tablet 0  . rivaroxaban (XARELTO) 20 MG TABS tablet Take 1 tablet (20 mg total) by mouth daily with supper. 60 tablet 1  . triamcinolone ointment (KENALOG) 0.5 % Apply 1 application topically 2 (two) times daily. (Patient taking differently: Apply 1 application topically 2 (two) times daily as needed (itching). ) 30 g 0  . VENTOLIN HFA 108 (90 Base) MCG/ACT inhaler Inhale 1-2 puffs into the lungs every 4 (four) hours as needed for shortness of breath. 1 Inhaler 1  . amitriptyline (ELAVIL) 100 MG tablet Take 1 tablet (100 mg total) by mouth at bedtime. 90 tablet 1  . docusate sodium (  COLACE) 100 MG capsule Take 1 capsule (100 mg total) by mouth 2 (two) times daily. (Patient not taking: Reported on 02/02/2020) 10 capsule 0  . sertraline (ZOLOFT) 25 MG tablet Take 1 tablet (25 mg total) by mouth daily. (Patient not taking: Reported on 02/02/2020) 30 tablet 0   No current facility-administered medications for this visit.   Facility-Administered Medications Ordered in Other Visits  Medication Dose Route Frequency Provider Last Rate Last Admin  . heparin lock flush 100 unit/mL  500 Units Intravenous Once Corcoran, Melissa C, MD      . sodium chloride 0.9 % 1,000 mL with potassium chloride 20 mEq infusion   Intravenous  Once Honor Loh E, NP      . sodium chloride flush (NS) 0.9 % injection 10 mL  10 mL Intravenous Once Borders, Kirt Boys, NP        Past Medical History:  Diagnosis Date  . Abdominal pain 06/10/2018  . Abnormal cervical Papanicolaou smear 09/18/2017  . Anxiety   . Aortic atherosclerosis ([place])   . Arthritis    neck and knees  . 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 Blood transfusion without reported diagnosis   . Cervical cancer ([place]) 09/2016   mets lung  . Chronic anal fissure   . Chronic diarrhea   . Dyspnea   . Erosive gastropathy 09/18/2017  . Factor V Leiden mutation ([place])   . Fecal incontinence   . Genital warts   . GERD (gastroesophageal reflux disease)   . GI bleed 10/08/2018  . Heart murmur   . Hematochezia   . Hemorrhoids   . 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 Hepatitis, chronic ([place]) 05/05/2017  . History of cancer chemotherapy    completed 06/2017  . History of Clostridium difficile infection    while undergoing chemo.  Negative test 10/2017  . Ileocolic anastomotic leak   . 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 Intestinal infection due to Clostridium difficile 09/18/2017  . Macrocytic anemia with vitamin B12 deficiency   . Multiple gastric ulcers   . Nausea vomiting and diarrhea   . Pancolitis ([place]) 07/27/2018  . Perianal condylomata   . Pneumonia    History of  . Pulmonary nodules   . Rectal bleeding   . Small bowel obstruction ([place]) 08/2017  . Stiff neck    limited right turn  . Vitamin D deficiency     Past Surgical History:  Procedure Laterality Date  . CHOLECYSTECTOMY    . COLON SURGERY  08/2017   resection  . COLONOSCOPY WITH PROPOFOL N/A 12/20/2017   Procedure: COLONOSCOPY WITH PROPOFOL;  Surgeon: [name], MD;  Location: [facility] ENDOSCOPY;  Service: Gastroenterology;  Laterality: N/A;  . COLONOSCOPY WITH PROPOFOL N/A 07/30/2018   Procedure: COLONOSCOPY WITH PROPOFOL;   Surgeon: [name], MD;  Location: [facility] ENDOSCOPY;  Service: Gastroenterology;  Laterality: N/A;  . COLONOSCOPY WITH PROPOFOL N/A 10/10/2018   Procedure: COLONOSCOPY WITH PROPOFOL;  Surgeon: Lucilla Lame, MD;  Location[facility] END[facility];  Service: Endoscopy;  Laterality: N/A;  . DIAGNOSTIC LAPAROSCOPY    . ESOPHAGOGASTRODUODENOSCOPY (EGD) WITH PROPOFOL N/A 12/20/2017   Procedure: ESOPHAGOGASTRODUODENOSCOPY (EGD) WITH PROPOFOL;  Surgeon: [name], MD;  Location: [place];  Service: Gastroenterology;  Laterality: N/A;  . ESOPHAGOGASTRODUODENOSCOPY (EGD) WITH PROPOFOL  07/30/2018   Procedure: ESOPHAGOGASTRODUODENOSCOPY (EGD) WITH PROPOFOL;  Surgeon: Sherri Sear  Reece Levy, MD;  Location: [facility] ENDOSCOPY;  Service: Gastroenterology;;  . Otho Darner SIGMOIDOSCOPY N/A 11/21/2018   Procedure: FLEXIBLE SIGMOIDOSCOPY;  Surgeon: [name], MD;  Location[facility] ENDOSCOPY;  Service: Gastroenterology;  Laterality: N/A;  . IVC FILTER INSERTION N/A 01/01/2020   Procedure: IVC FILTER INSERTION;  Surgeon: Algernon Huxley, MD;  Location: [place] CV LAB;  Service: Cardiovascular;  Laterality: N/A;  . LAPAROTOMY N/A 08/31/2017   Procedure: EXPLORATORY LAPAROTOMY for SBO, ileocolectomy, removal of piece of uterine wall;  Surgeon: [name], MD;  Location: [facility] ORS;  Service: General;  Laterality: N/A;  . LASER ABLATION CONDOLAMATA N/A 02/22/2018   Procedure: LASER ABLATION/REMOVAL OF XNTZGYFVCBS AROUND ANUS AND VAGINA;  Surgeon: Michael Boston, MD;  Location: [place];  Service: General;  Laterality: N/A;  . OOPHORECTOMY    . PORTA CATH INSERTION N/A 05/13/2018   Procedure: PORTA CATH INSERTION;  Surgeon: Katha Cabal, MD;  Location: [place] CV LAB;  Service: Cardiovascular;  Laterality: N/A;  . SMALL INTESTINE SURGERY    . TANDEM RING INSERTION     x3  . THORACOTOMY    . TOTAL KNEE ARTHROPLASTY Left 04/24/2018   Procedure: TOTAL KNEE ARTHROPLASTY;  Surgeon:  Lovell Sheehan, MD;  Location: [facility] ORS;  Service: Orthopedics;  Laterality: Left;  . TOTAL KNEE ARTHROPLASTY Right 12/24/2019   Procedure: RIGHT TOTAL KNEE ARTHROPLASTY;  Surgeon: Lovell Sheehan, MD;  Location: [facility] ORS;  Service: Orthopedics;  Laterality: Right;     Social History   Tobacco Use  . Smoking status: Former Smoker    Packs/day: 0.25    Years: 10.00    Pack years: 2.50    Types: Cigarettes    Quit date: 10/16/2006    Years since quitting: 13.3  . Smokeless tobacco: Never Used  Substance Use Topics  . Alcohol use: Not Currently    Comment: seldom  . Drug use: Yes    Types: Marijuana    Comment: not very often      Family History  Problem Relation Age of Onset  . Hypertension Mother   . Hypertension Father   . Diabetes Father   . Hyperlipidemia Father   . Alcohol abuse Daughter   . Hypertension Maternal Grandmother   . Diabetes Maternal Grandmother   . Diabetes Paternal Grandmother   . Hypertension Paternal Grandmother      Allergies  Allergen Reactions  . Ketamine Anxiety and Other (See Comments)    Syncope episode/confusion      REVIEW OF SYSTEMS (Negative unless checked) Constitutional: [] ?Weight loss  [] ?Fever  [] ?Chills Cardiac: [] ?Chest pain   [] ?Chest pressure   [] ?Palpitations   [] ?Shortness of breath when laying flat   [] ?Shortness of breath at rest   [] ?Shortness of breath with exertion. Vascular:  [x] ?Pain in legs with walking   [x] ?Pain in legs at rest   [x] ?Pain in legs when laying flat   [] ?Claudication   [] ?Pain in feet when walking  [] ?Pain in feet at rest  [] ?Pain in feet when laying flat   [x] ?History of DVT   [x] ?Phlebitis   [x] ?Swelling in legs   [] ?Varicose veins   [] ?Non-healing ulcers Pulmonary:   [] ?Uses home oxygen   [] ?Productive cough   [] ?Hemoptysis   [] ?Wheeze  [] ?COPD   [] ?Asthma Neurologic:  [] ?Dizziness  [] ?Blackouts   [] ?Seizures   [] ?History of stroke   [] ?History of TIA  [] ?Aphasia   [] ?Temporary blindness    [] ?Dysphagia   [] ?Weakness or numbness in arms   [] ?  Weakness or numbness in legs Musculoskeletal:  [x] ?Arthritis   [] ?Joint swelling   [x] ?Joint pain   [] ?Low back pain Hematologic:  [] ?Easy bruising  [] ?Easy bleeding   [x] ?Hypercoagulable state   [] ?Anemic  [] ?Hepatitis Gastrointestinal:  [] ?Blood in stool   [] ?Vomiting blood  [x] ?Gastroesophageal reflux/heartburn   [] ?Difficulty swallowing. Genitourinary:  [] ?Chronic kidney disease   [] ?Difficult urination  [] ?Frequent urination  [] ?Burning with urination   [] ?Blood in urine Skin:  [] ?Rashes   [] ?Ulcers   [] ?Wounds Psychological:  [] ?History of anxiety   [] ? History of major depression.   Physical Examination  BP 120/81 (BP Location: Right Arm)   Pulse (!) 125   Resp 16   Wt 145 lb (65.8 kg)   BMI 22.05 kg/m  Gen:  WD/WN, NAD.  Appears older than stated age Head: [place]/AT, No temporalis wasting. Ear/Nose/Throat: Hearing grossly intact, nares w/o erythema or drainage Eyes: Conjunctiva clear. Sclera non-icteric Neck: Supple.  Trachea midline Pulmonary:  Good air movement, no use of accessory muscles.  Cardiac: RRR, no JVD Vascular:  Vessel Right Left  Radial Palpable Palpable               Musculoskeletal: M/S 5/5 throughout.  No deformity or atrophy.  1+ right lower extremity edema. Neurologic: Sensation grossly intact in extremities.  Symmetrical.  Speech is fluent.  Psychiatric: Judgment intact, Mood & affect appropriate for pt's clinical situation. Dermatologic: No rashes or ulcers noted.  No cellulitis or open wounds.       Labs Recent Results (from the past 2160 hour(s))  Magnesium     Status: Abnormal   Collection Time: 11/20/19  8:06 AM  Result Value Ref Range   Magnesium 1.5 (L) 1.7 - 2.4 mg/dL    Comment: Performed at [facility], [address] [place], [place] [address]  Comprehensive metabolic panel     Status: Abnormal   Collection Time: 11/20/19  8:06 AM  Result Value Ref Range   Sodium 130 (L)  135 - 145 mmol/L   Potassium 3.1 (L) 3.5 - 5.1 mmol/L   Chloride 96 (L) 98 - 111 mmol/L   CO2 24 22 - 32 mmol/L   Glucose, Bld 165 (H) 70 - 99 mg/dL   BUN 13 6 - 20 mg/dL   Creatinine, Ser 0.70 0.44 - 1.00 mg/dL   Calcium 8.7 (L) 8.9 - 10.3 mg/dL   Total Protein 7.0 6.5 - 8.1 g/dL   Albumin 3.4 (L) 3.5 - 5.0 g/dL   AST 28 15 - 41 U/L   ALT 45 (H) 0 - 44 U/L   Alkaline Phosphatase 162 (H) 38 - 126 U/L   Total Bilirubin 0.9 0.3 - 1.2 mg/dL   GFR calc non Af Amer >60 >60 mL/min   GFR calc Af Amer >60 >60 mL/min   Anion gap 10 5 - 15    Comment: Performed at [facility], [address] [place], [place] [address]  CBC with Differential     Status: Abnormal   Collection Time: 11/20/19  8:06 AM  Result Value Ref Range   WBC 0.9 (LL) 4.0 - 10.5 K/uL    Comment: REPEATED TO VERIFY [facility] CENTER CRITICAL VALUE PROTOCOL    RBC 3.47 (L) 3.87 - 5.11 MIL/uL   Hemoglobin 11.3 (L) 12.0 - 15.0 g/dL   HCT 35.5 (L) 36.0 - 46.0 %   MCV 102.3 (H) 80.0 - 100.0 fL   MCH 32.6 26.0 - 34.0 pg   MCHC 31.8 30.0 - 36.0 g/dL  RDW 12.2 11.5 - 15.5 %   Platelets 70 (L) 150 - 400 K/uL   nRBC 0.0 0.0 - 0.2 %   Neutrophils Relative % 35 %   Neutro Abs 0.3 (L) 1.7 - 7.7 K/uL    Comment: REPEATED TO VERIFY THIS CRITICAL RESULT HAS VERIFIED AND BEEN CALLED TO JENNY BURNS BY LONNIE RHONE ON 02 04 2021 AT 0840, AND HAS BEEN READ BACK. @8 :27AM    Lymphocytes Relative 48 %   Lymphs Abs 0.4 (L) 0.7 - 4.0 K/uL   Monocytes Relative 4 %   Monocytes Absolute 0.0 (L) 0.1 - 1.0 K/uL   Eosinophils Relative 5 %   Eosinophils Absolute 0.1 0.0 - 0.5 K/uL   Basophils Relative 0 %   Basophils Absolute 0.0 0.0 - 0.1 K/uL   WBC Morphology TOO FEW TO COUNT.  SMEAR AVAILABLE FOR REVIEW    Smear Review MORPHOLOGY UNREMARKABLE     Comment: PLATELETS APPEAR DECREASED Reviewed    Immature Granulocytes 8 %   Abs Immature Granulocytes 0.07 0.00 - 0.07 K/uL    Comment: Performed at [facility], [address] [place], [place] [address]  Magnesium     Status: Abnormal   Collection Time: 12/11/19  8:55 AM  Result Value Ref Range   Magnesium 1.6 (L) 1.7 - 2.4 mg/dL    Comment: Performed at [facility], [address] [place], [place] [address]  Comprehensive metabolic panel     Status: Abnormal   Collection Time: 12/11/19  8:55 AM  Result Value Ref Range   Sodium 137 135 - 145 mmol/L   Potassium 4.3 3.5 - 5.1 mmol/L   Chloride 103 98 - 111 mmol/L   CO2 27 22 - 32 mmol/L   Glucose, Bld 88 70 - 99 mg/dL    Comment: Glucose reference range applies only to samples taken after fasting for at least 8 hours.   BUN 12 6 - 20 mg/dL   Creatinine, Ser 0.86 0.44 - 1.00 mg/dL   Calcium 8.9 8.9 - 10.3 mg/dL   Total Protein 7.5 6.5 - 8.1 g/dL   Albumin 3.7 3.5 - 5.0 g/dL   AST 39 15 - 41 U/L   ALT 40 0 - 44 U/L   Alkaline Phosphatase 171 (H) 38 - 126 U/L   Total Bilirubin 0.5 0.3 - 1.2 mg/dL   GFR calc non Af Amer >60 >60 mL/min   GFR calc Af Amer >60 >60 mL/min   Anion gap 7 5 - 15    Comment: Performed at [facility], [address] [place], [place] [address]  CBC with Differential     Status: Abnormal   Collection Time: 12/11/19  8:55 AM  Result Value Ref Range   WBC 3.8 (L) 4.0 - 10.5 K/uL   RBC 3.78 (L) 3.87 - 5.11 MIL/uL   Hemoglobin 12.5 12.0 - 15.0 g/dL   HCT 39.4 36.0 - 46.0 %   MCV 104.2 (H) 80.0 - 100.0 fL   MCH 33.1 26.0 - 34.0 pg   MCHC 31.7 30.0 - 36.0 g/dL   RDW 13.7 11.5 - 15.5 %   Platelets 179 150 - 400 K/uL   nRBC 0.0 0.0 - 0.2 %   Neutrophils Relative % 67 %   Neutro Abs 2.6 1.7 - 7.7 K/uL   Lymphocytes Relative 20 %   Lymphs Abs 0.8 0.7 - 4.0 K/uL   Monocytes Relative 11 %   Monocytes Absolute 0.4 0.1 - 1.0 K/uL  Eosinophils Relative 1 %   Eosinophils Absolute 0.0 0.0 - 0.5 K/uL   Basophils Relative 0 %   Basophils Absolute 0.0 0.0 - 0.1 K/uL   Immature Granulocytes 1 %   Abs Immature Granulocytes 0.02 0.00 - 0.07 K/uL    Comment:  Performed at [facility], [address], [place], [place] [address]  Urine Drug Screen, Qualitative [facility])     Status: Abnormal   Collection Time: 12/11/19 10:12 AM  Result Value Ref Range   Tricyclic, Ur Screen POSITIVE (A) NONE DETECTED   Amphetamines, Ur Screen NONE DETECTED NONE DETECTED   MDMA (Ecstasy)Ur Screen NONE DETECTED NONE DETECTED   Cocaine Metabolite,Ur [place] NONE DETECTED NONE DETECTED   Opiate, Ur Screen NONE DETECTED NONE DETECTED   Phencyclidine (PCP) Ur S NONE DETECTED NONE DETECTED   Cannabinoid 50 Ng, Ur  POSITIVE (A) NONE DETECTED   Barbiturates, Ur Screen NONE DETECTED NONE DETECTED   Benzodiazepine, Ur Scrn POSITIVE (A) NONE DETECTED   Methadone Scn, Ur NONE DETECTED NONE DETECTED    Comment: (NOTE) Tricyclics + metabolites, urine    Cutoff 1000 ng/mL Amphetamines + metabolites, urine  Cutoff 1000 ng/mL MDMA (Ecstasy), urine              Cutoff 500 ng/mL Cocaine Metabolite, urine          Cutoff 300 ng/mL Opiate + metabolites, urine        Cutoff 300 ng/mL Phencyclidine (PCP), urine         Cutoff 25 ng/mL Cannabinoid, urine                 Cutoff 50 ng/mL Barbiturates + metabolites, urine  Cutoff 200 ng/mL Benzodiazepine, urine              Cutoff 200 ng/mL Methadone, urine                   Cutoff 300 ng/mL The urine drug screen provides only a preliminary, unconfirmed analytical test result and should not be used for non-medical purposes. Clinical consideration and professional judgment should be applied to any positive drug screen result due to possible interfering substances. A more specific alternate chemical method must be used in order to obtain a confirmed analytical result. Gas chromatography / mass spectrometry (GC/MS) is the preferred confirmat ory method. Performed at [facility], [address] [place], [place] [address]   Miscellaneous [facility] test (send-out)     Status: None   Collection Time: 12/11/19 10:12 AM  Result  Value Ref Range   [facility] test code [id number]    [facility] test name COMP DRUG ANALYSIS     Comment: Performed at [facility], [address] [place], [place] [address]   Misc [facility] result COMMENT     Comment: (NOTE) Test Ordered: [id number] Comprehensive Drug Analysis,Ur Summary Report (Summary)       Note                      OTSNC   ==================================================================== Comprehensive Drug Analysis,Ur ==================================================================== Test                             Result       Flag       Units Drug Present  Oxazepam                       200  ng/mg creat  Temazepam                      273                     ng/mg creat   Oxazepam and temazepam are expected metabolites of diazepam.   Oxazepam is also an expected metabolite of other benzodiazepine   drugs, including chlordiazepoxide, prazepam, clorazepate, halazepam,   and temazepam.  Oxazepam and temazepam are available as scheduled   prescription medications.  Carboxy-THC                    >909                    ng/mg creat   Carboxy-THC is a metabolite of tetrahydrocannabinol (THC). Source of   THC is most commonly herbal marijuana o r marijuana-based products,   but THC is also present in a scheduled prescription medication.   Trace amounts of THC can be present in hemp and cannabidiol (CBD)   products. This test is not intended to distinguish between delta-9-   tetrahydrocannabinol, the predominant form of THC in most herbal or   marijuana-based products, and delta-8-tetrahydrocannabinol.  Oxycodone                      673                     ng/mg creat  Oxymorphone                    1152                    ng/mg creat  Noroxycodone                   4697                    ng/mg creat  Noroxymorphone                 749                     ng/mg creat   Sources of oxycodone are scheduled prescription medications.   Oxymorphone,  noroxycodone, and noroxymorphone are expected   metabolites of oxycodone. Oxymorphone is also available as a   scheduled prescription medication.  Amitriptyline                  PRESENT  Nortriptyline                  PRESENT   Nortriptyline may be administered as a pre scription drug; it is also   an expected metabolite of amitriptyline.  Acetaminophen                  PRESENT  Hydroxyzine                    PRESENT  Promethazine                   PRESENT ==================================================================== Test                      Result    Flag   Units      Ref Range  Creatinine              110  mg/dL      >=20 ==================================================================== Declared Medications: Medication list was not provided. ==================================================================== For clinical consultation, please call [phone]. ==================================================================== Performed At: [facility] [place], [place] [id number] Rush Farmer MD [id number] Performed At: [place] [place], [place] [id number] Avis Epley PhD LX:[id number] Performed At: [facility] [place], [place] [id number] Thomasene Ripple MD [id number]:[id number]   Urinalysis, Routine w reflex microscopic     Status: Abnormal   Collection Time: 12/22/19 10:14 AM  Result Value Ref Range   Color, Urine AMBER (A) YELLOW    Comment: BIOCHEMICALS MAY BE AFFECTED BY COLOR   APPearance HAZY (A) CLEAR   Specific Gravity, Urine 1.023 1.005 - 1.030   pH 5.0 5.0 - 8.0   Glucose, UA NEGATIVE NEGATIVE mg/dL   Hgb urine dipstick NEGATIVE NEGATIVE   Bilirubin Urine SMALL (A) NEGATIVE   Ketones, ur NEGATIVE NEGATIVE mg/dL   Protein, ur 30 (A) NEGATIVE mg/dL   Nitrite NEGATIVE NEGATIVE   Leukocytes,Ua TRACE (A) NEGATIVE   RBC / HPF 6-10 0 - 5 RBC/hpf   WBC, UA  21-50 0 - 5 WBC/hpf   Bacteria, UA NONE SEEN NONE SEEN   Squamous Epithelial / LPF NONE SEEN 0 - 5   Mucus PRESENT    Hyaline Casts, UA PRESENT    Ca Oxalate Crys, UA PRESENT     Comment: Performed at [facility], [address] [place], [place] [address]  APTT     Status: None   Collection Time: 12/22/19 10:32 AM  Result Value Ref Range   aPTT 25 24 - 36 seconds    Comment: Performed at [facility], [address] [place], [place] [address]  Basic metabolic panel     Status: Abnormal   Collection Time: 12/22/19 10:32 AM  Result Value Ref Range   Sodium 134 (L) 135 - 145 mmol/L   Potassium 3.4 (L) 3.5 - 5.1 mmol/L   Chloride 100 98 - 111 mmol/L   CO2 23 22 - 32 mmol/L   Glucose, Bld 101 (H) 70 - 99 mg/dL    Comment: Glucose reference range applies only to samples taken after fasting for at least 8 hours.   BUN 11 6 - 20 mg/dL   Creatinine, Ser 1.32 (H) 0.44 - 1.00 mg/dL   Calcium 9.5 8.9 - 10.3 mg/dL   GFR calc non Af Amer 46 (L) >60 mL/min   GFR calc Af Amer 54 (L) >60 mL/min   Anion gap 11 5 - 15    Comment: Performed at [facility], [address] [place], [place] [address]  CBC     Status: None   Collection Time: 12/22/19 10:32 AM  Result Value Ref Range   WBC 5.6 4.0 - 10.5 K/uL   RBC 4.06 3.87 - 5.11 MIL/uL   Hemoglobin 13.6 12.0 - 15.0 g/dL   HCT 39.4 36.0 - 46.0 %   MCV 97.0 80.0 - 100.0 fL   MCH 33.5 26.0 - 34.0 pg   MCHC 34.5 30.0 - 36.0 g/dL   RDW 12.6 11.5 - 15.5 %   Platelets 250 150 - 400 K/uL   nRBC 0.0 0.0 - 0.2 %    Comment: Performed at [facility], [address] [place], [place] [address]  Protime-INR     Status: None   Collection Time: 12/22/19 10:32 AM  Result Value Ref Range  Prothrombin Time 12.8 11.4 - 15.2 seconds   INR 1.0 0.8 - 1.2    Comment: (NOTE) INR goal varies based on device and disease states. Performed at [facility], [address] [place], [place] [address]     Magnesium     Status: Abnormal   Collection Time: 12/22/19 10:32 AM  Result Value Ref Range   Magnesium 1.5 (L) 1.7 - 2.4 mg/dL    Comment: Performed at [facility], [address] [place], [place] [address]  Urine Drug Screen, Qualitative ([place] only)     Status: Abnormal   Collection Time: 12/24/19  6:04 AM  Result Value Ref Range   Tricyclic, Ur Screen POSITIVE (A) NONE DETECTED   Amphetamines, Ur Screen NONE DETECTED NONE DETECTED   MDMA (Ecstasy)Ur Screen NONE DETECTED NONE DETECTED   Cocaine Metabolite,Ur [place] NONE DETECTED NONE DETECTED   Opiate, Ur Screen POSITIVE (A) NONE DETECTED   Phencyclidine (PCP) Ur S NONE DETECTED NONE DETECTED   Cannabinoid 50 Ng, Ur [place] POSITIVE (A) NONE DETECTED   Barbiturates, Ur Screen NONE DETECTED NONE DETECTED   Benzodiazepine, Ur Scrn POSITIVE (A) NONE DETECTED   Methadone Scn, Ur NONE DETECTED NONE DETECTED    Comment: (NOTE) Tricyclics + metabolites, urine    Cutoff 1000 ng/mL Amphetamines + metabolites, urine  Cutoff 1000 ng/mL MDMA (Ecstasy), urine              Cutoff 500 ng/mL Cocaine Metabolite, urine          Cutoff 300 ng/mL Opiate + metabolites, urine        Cutoff 300 ng/mL Phencyclidine (PCP), urine         Cutoff 25 ng/mL Cannabinoid, urine                 Cutoff 50 ng/mL Barbiturates + metabolites, urine  Cutoff 200 ng/mL Benzodiazepine, urine              Cutoff 200 ng/mL Methadone, urine                   Cutoff 300 ng/mL The urine drug screen provides only a preliminary, unconfirmed analytical test result and should not be used for non-medical purposes. Clinical consideration and professional judgment should be applied to any positive drug screen result due to possible interfering substances. A more specific alternate chemical method must be used in order to obtain a confirmed analytical result. Gas chromatography / mass spectrometry (GC/MS) is the preferred confirmat ory method. Performed at [facility], [address], [place], [place] [address]   Surgical PCR screen     Status: None   Collection Time: 12/24/19  6:26 AM   Specimen: Nasal Mucosa; Nasal Swab  Result Value Ref Range   MRSA, PCR NEGATIVE NEGATIVE   Staphylococcus aureus NEGATIVE NEGATIVE    Comment: (NOTE) The Xpert SA Assay (FDA approved for NASAL specimens in patients 64 years of age and older), is one component of a comprehensive surveillance program. It is not intended to diagnose infection nor to guide or monitor treatment. Performed at [facility], [address] [place], [place] [address]   Type and screen [place]     Status: None   Collection Time: 12/24/19  6:52 AM  Result Value Ref Range   ABO/RH(D) O POS    Antibody Screen NEG    Sample Expiration      12/27/2019,2359 Performed at [facility], [address],  [place], [place] [address]   CBC     Status: Abnormal   Collection Time: 12/25/19  4:05 AM  Result Value Ref Range   WBC 5.6 4.0 - 10.5 K/uL   RBC 2.58 (L) 3.87 - 5.11 MIL/uL   Hemoglobin 8.6 (L) 12.0 - 15.0 g/dL   HCT 25.6 (L) 36.0 - 46.0 %   MCV 99.2 80.0 - 100.0 fL   MCH 33.3 26.0 - 34.0 pg   MCHC 33.6 30.0 - 36.0 g/dL   RDW 12.4 11.5 - 15.5 %   Platelets 163 150 - 400 K/uL   nRBC 0.0 0.0 - 0.2 %    Comment: Performed at [facility], [address] [place], [place] [address]  Basic metabolic panel     Status: Abnormal   Collection Time: 12/25/19  4:05 AM  Result Value Ref Range   Sodium 135 135 - 145 mmol/L   Potassium 4.1 3.5 - 5.1 mmol/L   Chloride 104 98 - 111 mmol/L   CO2 25 22 - 32 mmol/L   Glucose, Bld 145 (H) 70 - 99 mg/dL    Comment: Glucose reference range applies only to samples taken after fasting for at least 8 hours.   BUN 12 6 - 20 mg/dL   Creatinine, Ser 1.16 (H) 0.44 - 1.00 mg/dL   Calcium 8.3 (L) 8.9 - 10.3 mg/dL   GFR calc non Af Amer 54 (L) >60 mL/min   GFR calc Af Amer >60 >60 mL/min   Anion gap  6 5 - 15    Comment: Performed at [facility], [address] [place], [place] [address]  CBC with Differential     Status: Abnormal   Collection Time: 12/27/19 10:32 AM  Result Value Ref Range   WBC 5.1 4.0 - 10.5 K/uL   RBC 2.94 (L) 3.87 - 5.11 MIL/uL   Hemoglobin 9.7 (L) 12.0 - 15.0 g/dL   HCT 29.0 (L) 36.0 - 46.0 %   MCV 98.6 80.0 - 100.0 fL   MCH 33.0 26.0 - 34.0 pg   MCHC 33.4 30.0 - 36.0 g/dL   RDW 12.9 11.5 - 15.5 %   Platelets 264 150 - 400 K/uL   nRBC 0.0 0.0 - 0.2 %   Neutrophils Relative % 78 %   Neutro Abs 4.0 1.7 - 7.7 K/uL   Lymphocytes Relative 13 %   Lymphs Abs 0.6 (L) 0.7 - 4.0 K/uL   Monocytes Relative 9 %   Monocytes Absolute 0.5 0.1 - 1.0 K/uL   Eosinophils Relative 0 %   Eosinophils Absolute 0.0 0.0 - 0.5 K/uL   Basophils Relative 0 %   Basophils Absolute 0.0 0.0 - 0.1 K/uL   Immature Granulocytes 0 %   Abs Immature Granulocytes 0.02 0.00 - 0.07 K/uL    Comment: Performed at [facility], [address] [place], [place] [address]  Comprehensive metabolic panel     Status: Abnormal   Collection Time: 12/27/19 10:32 AM  Result Value Ref Range   Sodium 133 (L) 135 - 145 mmol/L   Potassium 3.7 3.5 - 5.1 mmol/L   Chloride 96 (L) 98 - 111 mmol/L   CO2 26 22 - 32 mmol/L   Glucose, Bld 78 70 - 99 mg/dL    Comment: Glucose reference range applies only to samples taken after fasting for at least 8 hours.   BUN 9 6 - 20 mg/dL   Creatinine, Ser 0.80 0.44 - 1.00 mg/dL   Calcium 8.5 (L)  8.9 - 10.3 mg/dL   Total Protein 6.9 6.5 - 8.1 g/dL   Albumin 3.1 (L) 3.5 - 5.0 g/dL   AST 80 (H) 15 - 41 U/L   ALT 61 (H) 0 - 44 U/L   Alkaline Phosphatase 269 (H) 38 - 126 U/L   Total Bilirubin 1.5 (H) 0.3 - 1.2 mg/dL   GFR calc non Af Amer >60 >60 mL/min   GFR calc Af Amer >60 >60 mL/min   Anion gap 11 5 - 15    Comment: Performed at [facility], [address] [place], [place] [address]  Protime-INR     Status: None   Collection Time:  12/27/19 10:32 AM  Result Value Ref Range   Prothrombin Time 12.9 11.4 - 15.2 seconds   INR 1.0 0.8 - 1.2    Comment: (NOTE) INR goal varies based on device and disease states. Performed at [facility], [address] [place], [place] [address]   APTT     Status: Abnormal   Collection Time: 12/27/19 10:32 AM  Result Value Ref Range   aPTT 37 (H) 24 - 36 seconds    Comment:        IF BASELINE aPTT IS ELEVATED, SUGGEST PATIENT RISK ASSESSMENT BE USED TO DETERMINE APPROPRIATE ANTICOAGULANT THERAPY. Performed at [facility], [address] [place], [place] [address]   Comprehensive metabolic panel     Status: Abnormal   Collection Time: 12/28/19  5:24 PM  Result Value Ref Range   Sodium 132 (L) 135 - 145 mmol/L   Potassium 3.6 3.5 - 5.1 mmol/L   Chloride 96 (L) 98 - 111 mmol/L   CO2 26 22 - 32 mmol/L   Glucose, Bld 71 70 - 99 mg/dL    Comment: Glucose reference range applies only to samples taken after fasting for at least 8 hours.   BUN 10 6 - 20 mg/dL   Creatinine, Ser 0.87 0.44 - 1.00 mg/dL   Calcium 8.5 (L) 8.9 - 10.3 mg/dL   Total Protein 6.7 6.5 - 8.1 g/dL   Albumin 3.1 (L) 3.5 - 5.0 g/dL   AST 79 (H) 15 - 41 U/L   ALT 58 (H) 0 - 44 U/L   Alkaline Phosphatase 241 (H) 38 - 126 U/L   Total Bilirubin 1.4 (H) 0.3 - 1.2 mg/dL   GFR calc non Af Amer >60 >60 mL/min   GFR calc Af Amer >60 >60 mL/min   Anion gap 10 5 - 15    Comment: Performed at [facility], [address] [place], [place] [address]  CBC with Differential     Status: Abnormal   Collection Time: 12/28/19  5:24 PM  Result Value Ref Range   WBC 5.6 4.0 - 10.5 K/uL   RBC 2.81 (L) 3.87 - 5.11 MIL/uL   Hemoglobin 9.2 (L) 12.0 - 15.0 g/dL   HCT 28.2 (L) 36.0 - 46.0 %   MCV 100.4 (H) 80.0 - 100.0 fL   MCH 32.7 26.0 - 34.0 pg   MCHC 32.6 30.0 - 36.0 g/dL   RDW 13.0 11.5 - 15.5 %   Platelets 286 150 - 400 K/uL   nRBC 0.0 0.0 - 0.2 %   Neutrophils Relative % 74 %   Neutro Abs  4.2 1.7 - 7.7 K/uL   Lymphocytes Relative 15 %   Lymphs Abs 0.8 0.7 - 4.0 K/uL   Monocytes Relative 10 %   Monocytes Absolute 0.6 0.1 - 1.0 K/uL  Eosinophils Relative 1 %   Eosinophils Absolute 0.0 0.0 - 0.5 K/uL   Basophils Relative 0 %   Basophils Absolute 0.0 0.0 - 0.1 K/uL   Immature Granulocytes 0 %   Abs Immature Granulocytes 0.02 0.00 - 0.07 K/uL    Comment: Performed at [facility], [address] [place], [place] [address]  Lactic acid, plasma     Status: None   Collection Time: 12/28/19  9:11 PM  Result Value Ref Range   Lactic Acid, Venous 0.8 0.5 - 1.9 mmol/L    Comment: Performed at [facility], [address] [place], [place] [address]  Protime-INR     Status: Abnormal   Collection Time: 12/28/19  9:11 PM  Result Value Ref Range   Prothrombin Time 28.5 (H) 11.4 - 15.2 seconds   INR 2.7 (H) 0.8 - 1.2    Comment: (NOTE) INR goal varies based on device and disease states. Performed at [facility], [address] [place], [place] [address]   CK     Status: None   Collection Time: 12/28/19  9:11 PM  Result Value Ref Range   Total CK 68 38 - 234 U/L    Comment: Performed at [facility], [place], [place] [address]  SARS CORONAVIRUS 2 (TAT 6-24 HRS) Nasopharyngeal Nasopharyngeal Swab     Status: None   Collection Time: 12/28/19 10:39 PM   Specimen: Nasopharyngeal Swab  Result Value Ref Range   SARS Coronavirus 2 NEGATIVE NEGATIVE    Comment: (NOTE) SARS-CoV-2 target nucleic acids are NOT DETECTED. The SARS-CoV-2 RNA is generally detectable in upper and lower respiratory specimens during the acute phase of infection. Negative results do not preclude SARS-CoV-2 infection, do not rule out co-infections with other pathogens, and should not be used as the sole basis for treatment or other patient management decisions. Negative results must be combined with clinical observations, patient history, and  epidemiological information. The expected result is Negative. Fact Sheet for Patients: SugarRoll.be Fact Sheet for Healthcare Providers: https://www.woods-mathews.com/ This test is not yet approved or cleared by the [place] FDA and  has been authorized for detection and/or diagnosis of SARS-CoV-2 by FDA under an Emergency Use Authorization (EUA). This EUA will remain  in effect (meaning this test can be used) for the duration of the COVID-19 declaration under Section 56 4(b)(1) of the Act, 21 U.S.C. section 360bbb-3(b)(1), unless the authorization is terminated or revoked sooner. Performed at [facility], [place] [address] [place], [place] [address]   Basic metabolic panel     Status: Abnormal   Collection Time: 12/29/19  7:02 AM  Result Value Ref Range   Sodium 131 (L) 135 - 145 mmol/L   Potassium 3.4 (L) 3.5 - 5.1 mmol/L   Chloride 95 (L) 98 - 111 mmol/L   CO2 24 22 - 32 mmol/L   Glucose, Bld 89 70 - 99 mg/dL    Comment: Glucose reference range applies only to samples taken after fasting for at least 8 hours.   BUN 7 6 - 20 mg/dL   Creatinine, Ser 0.82 0.44 - 1.00 mg/dL   Calcium 8.4 (L) 8.9 - 10.3 mg/dL   GFR calc non Af Amer >60 >60 mL/min   GFR calc Af Amer >60 >60 mL/min   Anion gap 12 5 - 15    Comment: Performed at [facility], [address] [place],  [address]  CBC     Status: Abnormal  Collection Time: 12/29/19  7:02 AM  Result Value Ref Range   WBC 4.1 4.0 - 10.5 K/uL   RBC 2.57 (L) 3.87 - 5.11 MIL/uL   Hemoglobin 8.4 (L) 12.0 - 15.0 g/dL   HCT 25.5 (L) 36.0 - 46.0 %   MCV 99.2 80.0 - 100.0 fL   MCH 32.7 26.0 - 34.0 pg   MCHC 32.9 30.0 - 36.0 g/dL   RDW 13.2 11.5 - 15.5 %   Platelets 245 150 - 400 K/uL   nRBC 0.0 0.0 - 0.2 %    Comment: Performed at [facility], [address] [place], [place] [address]  Protime-INR     Status: Abnormal   Collection Time: 12/29/19  9:28 PM    Result Value Ref Range   Prothrombin Time 17.7 (H) 11.4 - 15.2 seconds   INR 1.5 (H) 0.8 - 1.2    Comment: (NOTE) INR goal varies based on device and disease states. Performed at [facility], [address] [place], [place] [address]   APTT     Status: Abnormal   Collection Time: 12/29/19  9:28 PM  Result Value Ref Range   aPTT 99 (H) 24 - 36 seconds    Comment:        IF BASELINE aPTT IS ELEVATED, SUGGEST PATIENT RISK ASSESSMENT BE USED TO DETERMINE APPROPRIATE ANTICOAGULANT THERAPY. Performed at [facility], [address] [place], [place] [address]   Fibrinogen     Status: Abnormal   Collection Time: 12/29/19  9:28 PM  Result Value Ref Range   Fibrinogen 582 (H) 210 - 475 mg/dL    Comment: Performed at [facility], [address] [place], [place] [address]  CBC     Status: Abnormal   Collection Time: 12/30/19  6:50 AM  Result Value Ref Range   WBC 4.7 4.0 - 10.5 K/uL   RBC 2.59 (L) 3.87 - 5.11 MIL/uL   Hemoglobin 8.6 (L) 12.0 - 15.0 g/dL   HCT 26.0 (L) 36.0 - 46.0 %   MCV 100.4 (H) 80.0 - 100.0 fL   MCH 33.2 26.0 - 34.0 pg   MCHC 33.1 30.0 - 36.0 g/dL   RDW 12.9 11.5 - 15.5 %   Platelets 249 150 - 400 K/uL   nRBC 0.0 0.0 - 0.2 %    Comment: Performed at [facility], [address] [place], [place] [address]  Comprehensive metabolic panel     Status: Abnormal   Collection Time: 12/30/19  6:50 AM  Result Value Ref Range   Sodium 135 135 - 145 mmol/L   Potassium 4.0 3.5 - 5.1 mmol/L   Chloride 97 (L) 98 - 111 mmol/L   CO2 30 22 - 32 mmol/L   Glucose, Bld 116 (H) 70 - 99 mg/dL    Comment: Glucose reference range applies only to samples taken after fasting for at least 8 hours.   BUN 5 (L) 6 - 20 mg/dL   Creatinine, Ser 0.67 0.44 - 1.00 mg/dL   Calcium 8.6 (L) 8.9 - 10.3 mg/dL   Total Protein 6.3 (L) 6.5 - 8.1 g/dL   Albumin 2.8 (L) 3.5 - 5.0 g/dL   AST 55 (H) 15 - 41 U/L   ALT 43 0 - 44 U/L   Alkaline Phosphatase  211 (H) 38 - 126 U/L   Total Bilirubin 0.9 0.3 - 1.2 mg/dL   GFR calc non Af Amer >60 >60 mL/min   GFR calc Af Amer >60 >  60 mL/min   Anion gap 8 5 - 15    Comment: Performed at [facility], [address] [place], [place] [address]  Reticulocytes     Status: Abnormal   Collection Time: 12/30/19  6:50 AM  Result Value Ref Range   Retic Ct Pct 3.9 (H) 0.4 - 3.1 %   RBC. 2.65 (L) 3.87 - 5.11 MIL/uL   Retic Count, Absolute 102.0 19.0 - 186.0 K/uL   Immature Retic Fract 22.2 (H) 2.3 - 15.9 %    Comment: Performed at [facility], [address] [place], [place] [address]  APTT     Status: Abnormal   Collection Time: 12/30/19  8:58 AM  Result Value Ref Range   aPTT 44 (H) 24 - 36 seconds    Comment:        IF BASELINE aPTT IS ELEVATED, SUGGEST PATIENT RISK ASSESSMENT BE USED TO DETERMINE APPROPRIATE ANTICOAGULANT THERAPY. Performed at [facility], [address] [place], [place] [address]   Protime-INR     Status: None   Collection Time: 12/30/19  8:58 AM  Result Value Ref Range   Prothrombin Time 14.2 11.4 - 15.2 seconds   INR 1.1 0.8 - 1.2    Comment: (NOTE) INR goal varies based on device and disease states. Performed at [facility], [address] [place], [place] [address]   CBC     Status: Abnormal   Collection Time: 12/31/19  5:57 AM  Result Value Ref Range   WBC 5.9 4.0 - 10.5 K/uL   RBC 2.74 (L) 3.87 - 5.11 MIL/uL   Hemoglobin 8.9 (L) 12.0 - 15.0 g/dL   HCT 27.1 (L) 36.0 - 46.0 %   MCV 98.9 80.0 - 100.0 fL   MCH 32.5 26.0 - 34.0 pg   MCHC 32.8 30.0 - 36.0 g/dL   RDW 13.0 11.5 - 15.5 %   Platelets 307 150 - 400 K/uL   nRBC 0.0 0.0 - 0.2 %    Comment: Performed at [facility], [address] [place], [place] [address]  Comprehensive metabolic panel     Status: Abnormal   Collection Time: 12/31/19  5:57 AM  Result Value Ref Range   Sodium 137 135 - 145 mmol/L   Potassium 4.2 3.5 - 5.1 mmol/L   Chloride 99  98 - 111 mmol/L   CO2 29 22 - 32 mmol/L   Glucose, Bld 118 (H) 70 - 99 mg/dL    Comment: Glucose reference range applies only to samples taken after fasting for at least 8 hours.   BUN 5 (L) 6 - 20 mg/dL   Creatinine, Ser 0.74 0.44 - 1.00 mg/dL   Calcium 9.0 8.9 - 10.3 mg/dL   Total Protein 6.7 6.5 - 8.1 g/dL   Albumin 3.0 (L) 3.5 - 5.0 g/dL   AST 59 (H) 15 - 41 U/L   ALT 45 (H) 0 - 44 U/L   Alkaline Phosphatase 225 (H) 38 - 126 U/L   Total Bilirubin 1.0 0.3 - 1.2 mg/dL   GFR calc non Af Amer >60 >60 mL/min   GFR calc Af Amer >60 >60 mL/min   Anion gap 9 5 - 15    Comment: Performed at [facility], [address] [place], [place] [address]  Magnesium     Status: Abnormal   Collection Time: 01/09/20 12:44 PM  Result Value Ref Range   Magnesium 1.4 (L) 1.7 - 2.4 mg/dL    Comment:  Performed at [facility], [address] [place], [place] [address]  Comprehensive metabolic panel     Status: Abnormal   Collection Time: 01/09/20 12:44 PM  Result Value Ref Range   Sodium 133 (L) 135 - 145 mmol/L   Potassium 3.5 3.5 - 5.1 mmol/L   Chloride 95 (L) 98 - 111 mmol/L   CO2 24 22 - 32 mmol/L   Glucose, Bld 90 70 - 99 mg/dL    Comment: Glucose reference range applies only to samples taken after fasting for at least 8 hours.   BUN 7 6 - 20 mg/dL   Creatinine, Ser 0.69 0.44 - 1.00 mg/dL   Calcium 9.0 8.9 - 10.3 mg/dL   Total Protein 7.5 6.5 - 8.1 g/dL   Albumin 3.0 (L) 3.5 - 5.0 g/dL   AST 51 (H) 15 - 41 U/L   ALT 27 0 - 44 U/L   Alkaline Phosphatase 223 (H) 38 - 126 U/L   Total Bilirubin 1.4 (H) 0.3 - 1.2 mg/dL   GFR calc non Af Amer >60 >60 mL/min   GFR calc Af Amer >60 >60 mL/min   Anion gap 14 5 - 15    Comment: Performed at [facility], [address] [place], [place] [address]  CBC with Differential     Status: Abnormal   Collection Time: 01/09/20 12:44 PM  Result Value Ref Range   WBC 5.4 4.0 - 10.5 K/uL   RBC 3.21 (L) 3.87 - 5.11 MIL/uL    Hemoglobin 10.2 (L) 12.0 - 15.0 g/dL   HCT 31.0 (L) 36.0 - 46.0 %   MCV 96.6 80.0 - 100.0 fL   MCH 31.8 26.0 - 34.0 pg   MCHC 32.9 30.0 - 36.0 g/dL   RDW 13.2 11.5 - 15.5 %   Platelets 286 150 - 400 K/uL   nRBC 0.0 0.0 - 0.2 %   Neutrophils Relative % 77 %   Neutro Abs 4.1 1.7 - 7.7 K/uL   Lymphocytes Relative 15 %   Lymphs Abs 0.8 0.7 - 4.0 K/uL   Monocytes Relative 7 %   Monocytes Absolute 0.4 0.1 - 1.0 K/uL   Eosinophils Relative 1 %   Eosinophils Absolute 0.0 0.0 - 0.5 K/uL   Basophils Relative 0 %   Basophils Absolute 0.0 0.0 - 0.1 K/uL   Immature Granulocytes 0 %   Abs Immature Granulocytes 0.02 0.00 - 0.07 K/uL    Comment: Performed at [facility], [address] [place], [place] [address]  CBC     Status: Abnormal   Collection Time: 01/14/20 10:42 AM  Result Value Ref Range   WBC 4.0 4.0 - 10.5 K/uL   RBC 3.53 (L) 3.87 - 5.11 MIL/uL   Hemoglobin 11.0 (L) 12.0 - 15.0 g/dL   HCT 34.7 (L) 36.0 - 46.0 %   MCV 98.3 80.0 - 100.0 fL   MCH 31.2 26.0 - 34.0 pg   MCHC 31.7 30.0 - 36.0 g/dL   RDW 13.4 11.5 - 15.5 %   Platelets 238 150 - 400 K/uL   nRBC 0.0 0.0 - 0.2 %    Comment: Performed at [facility], [address] [place], [place] [address]  Comprehensive metabolic panel     Status: Abnormal   Collection Time: 01/14/20 10:42 AM  Result Value Ref Range   Sodium 136 135 - 145 mmol/L   Potassium 3.8 3.5 - 5.1 mmol/L   Chloride 98 98 - 111 mmol/L  CO2 27 22 - 32 mmol/L   Glucose, Bld 114 (H) 70 - 99 mg/dL    Comment: Glucose reference range applies only to samples taken after fasting for at least 8 hours.   BUN 6 6 - 20 mg/dL   Creatinine, Ser 0.69 0.44 - 1.00 mg/dL   Calcium 9.5 8.9 - 10.3 mg/dL   Total Protein 8.1 6.5 - 8.1 g/dL   Albumin 3.4 (L) 3.5 - 5.0 g/dL   AST 32 15 - 41 U/L   ALT 17 0 - 44 U/L   Alkaline Phosphatase 194 (H) 38 - 126 U/L   Total Bilirubin 1.0 0.3 - 1.2 mg/dL   GFR calc non Af Amer >60 >60 mL/min   GFR calc Af Amer  >60 >60 mL/min   Anion gap 11 5 - 15    Comment: Performed at [facility], [address] [place], [place] [address]  Lipase, blood     Status: Abnormal   Collection Time: 01/14/20 10:42 AM  Result Value Ref Range   Lipase 59 (H) 11 - 51 U/L    Comment: Performed at [facility], [address] [place], [place] [address]  Urinalysis, Complete w Microscopic     Status: Abnormal   Collection Time: 01/14/20 10:42 AM  Result Value Ref Range   Color, Urine YELLOW (A) YELLOW   APPearance CLEAR (A) CLEAR   Specific Gravity, Urine >1.046 (H) 1.005 - 1.030   pH 7.0 5.0 - 8.0   Glucose, UA NEGATIVE NEGATIVE mg/dL   Hgb urine dipstick NEGATIVE NEGATIVE   Bilirubin Urine NEGATIVE NEGATIVE   Ketones, ur 5 (A) NEGATIVE mg/dL   Protein, ur NEGATIVE NEGATIVE mg/dL   Nitrite NEGATIVE NEGATIVE   Leukocytes,Ua TRACE (A) NEGATIVE   RBC / HPF 0-5 0 - 5 RBC/hpf   WBC, UA 0-5 0 - 5 WBC/hpf   Bacteria, UA NONE SEEN NONE SEEN   Squamous Epithelial / LPF 0-5 0 - 5   Mucus PRESENT     Comment: Performed at [facility], [address] [place], [place] [address]  Comprehensive metabolic panel     Status: Abnormal   Collection Time: 01/23/20 12:54 PM  Result Value Ref Range   Sodium 137 135 - 145 mmol/L   Potassium 3.5 3.5 - 5.1 mmol/L   Chloride 99 98 - 111 mmol/L   CO2 26 22 - 32 mmol/L   Glucose, Bld 107 (H) 70 - 99 mg/dL    Comment: Glucose reference range applies only to samples taken after fasting for at least 8 hours.   BUN 9 6 - 20 mg/dL   Creatinine, Ser 0.59 0.44 - 1.00 mg/dL   Calcium 9.1 8.9 - 10.3 mg/dL   Total Protein 8.0 6.5 - 8.1 g/dL   Albumin 3.5 3.5 - 5.0 g/dL   AST 24 15 - 41 U/L   ALT 11 0 - 44 U/L   Alkaline Phosphatase 173 (H) 38 - 126 U/L   Total Bilirubin 0.7 0.3 - 1.2 mg/dL   GFR calc non Af Amer >60 >60 mL/min   GFR calc Af Amer >60 >60 mL/min   Anion gap 12 5 - 15    Comment: Performed at [facility], [address]  [place],  [address]  Magnesium     Status: Abnormal   Collection Time: 01/23/20 12:54 PM  Result Value Ref Range   Magnesium 1.5 (L) 1.7 - 2.4 mg/dL    Comment: Performed at [facility]  [place], [address] [place], [place] [address]  CBC with Differential     Status: Abnormal   Collection Time: 01/23/20 12:54 PM  Result Value Ref Range   WBC 3.9 (L) 4.0 - 10.5 K/uL   RBC 3.75 (L) 3.87 - 5.11 MIL/uL   Hemoglobin 11.5 (L) 12.0 - 15.0 g/dL   HCT 36.8 36.0 - 46.0 %   MCV 98.1 80.0 - 100.0 fL   MCH 30.7 26.0 - 34.0 pg   MCHC 31.3 30.0 - 36.0 g/dL   RDW 13.5 11.5 - 15.5 %   Platelets 207 150 - 400 K/uL   nRBC 0.0 0.0 - 0.2 %   Neutrophils Relative % 78 %   Neutro Abs 3.1 1.7 - 7.7 K/uL   Lymphocytes Relative 14 %   Lymphs Abs 0.5 (L) 0.7 - 4.0 K/uL   Monocytes Relative 6 %   Monocytes Absolute 0.2 0.1 - 1.0 K/uL   Eosinophils Relative 1 %   Eosinophils Absolute 0.0 0.0 - 0.5 K/uL   Basophils Relative 0 %   Basophils Absolute 0.0 0.0 - 0.1 K/uL   Immature Granulocytes 1 %   Abs Immature Granulocytes 0.02 0.00 - 0.07 K/uL    Comment: Performed at [facility], [address] [place], [place] [address]  CBC with Differential     Status: Abnormal   Collection Time: 02/02/20  8:37 AM  Result Value Ref Range   WBC 4.1 4.0 - 10.5 K/uL   RBC 3.56 (L) 3.87 - 5.11 MIL/uL   Hemoglobin 11.0 (L) 12.0 - 15.0 g/dL   HCT 33.7 (L) 36.0 - 46.0 %   MCV 94.7 80.0 - 100.0 fL   MCH 30.9 26.0 - 34.0 pg   MCHC 32.6 30.0 - 36.0 g/dL   RDW 13.4 11.5 - 15.5 %   Platelets 200 150 - 400 K/uL   nRBC 0.0 0.0 - 0.2 %   Neutrophils Relative % 73 %   Neutro Abs 3.0 1.7 - 7.7 K/uL   Lymphocytes Relative 19 %   Lymphs Abs 0.8 0.7 - 4.0 K/uL   Monocytes Relative 7 %   Monocytes Absolute 0.3 0.1 - 1.0 K/uL   Eosinophils Relative 1 %   Eosinophils Absolute 0.0 0.0 - 0.5 K/uL   Basophils Relative 0 %   Basophils Absolute 0.0 0.0 - 0.1 K/uL   Immature Granulocytes 0 %   Abs Immature  Granulocytes 0.01 0.00 - 0.07 K/uL    Comment: Performed at [facility], [address] [place], [place] [address]  Comprehensive metabolic panel     Status: Abnormal   Collection Time: 02/02/20  8:37 AM  Result Value Ref Range   Sodium 134 (L) 135 - 145 mmol/L   Potassium 3.3 (L) 3.5 - 5.1 mmol/L   Chloride 100 98 - 111 mmol/L   CO2 23 22 - 32 mmol/L   Glucose, Bld 112 (H) 70 - 99 mg/dL    Comment: Glucose reference range applies only to samples taken after fasting for at least 8 hours.   BUN 8 6 - 20 mg/dL   Creatinine, Ser 0.60 0.44 - 1.00 mg/dL   Calcium 9.0 8.9 - 10.3 mg/dL   Total Protein 7.2 6.5 - 8.1 g/dL   Albumin 3.0 (L) 3.5 - 5.0 g/dL   AST 19 15 - 41 U/L   ALT 10 0 - 44 U/L   Alkaline Phosphatase 129 (H) 38 - 126 U/L   Total Bilirubin 0.5 0.3 -  1.2 mg/dL   GFR calc non Af Amer >60 >60 mL/min   GFR calc Af Amer >60 >60 mL/min   Anion gap 11 5 - 15    Comment: Performed at [facility], [address] [place], [place] [address]  Lipase, blood     Status: None   Collection Time: 02/02/20  8:37 AM  Result Value Ref Range   Lipase 31 11 - 51 U/L    Comment: Performed at [facility], [address] [place], [place] [address]  Magnesium     Status: Abnormal   Collection Time: 02/02/20  8:37 AM  Result Value Ref Range   Magnesium 1.4 (L) 1.7 - 2.4 mg/dL    Comment: Performed at [facility], [address] [place], [place] [address]  Urinalysis, Complete w Microscopic     Status: Abnormal   Collection Time: 02/02/20 11:07 AM  Result Value Ref Range   Color, Urine YELLOW (A) YELLOW   APPearance CLEAR (A) CLEAR   Specific Gravity, Urine 1.016 1.005 - 1.030   pH 7.0 5.0 - 8.0   Glucose, UA NEGATIVE NEGATIVE mg/dL   Hgb urine dipstick NEGATIVE NEGATIVE   Bilirubin Urine NEGATIVE NEGATIVE   Ketones, ur NEGATIVE NEGATIVE mg/dL   Protein, ur NEGATIVE NEGATIVE mg/dL   Nitrite NEGATIVE NEGATIVE   Leukocytes,Ua NEGATIVE NEGATIVE     RBC / HPF 0-5 0 - 5 RBC/hpf   WBC, UA 0-5 0 - 5 WBC/hpf   Bacteria, UA RARE (A) NONE SEEN   Squamous Epithelial / LPF NONE SEEN 0 - 5   Mucus PRESENT     Comment: Performed at [facility], [address] [place], [place] [address]  Lipase, blood     Status: None   Collection Time: 02/06/20  4:20 PM  Result Value Ref Range   Lipase 19 11 - 51 U/L    Comment: Performed at [facility], [address] [place], [place] [address]  Comprehensive metabolic panel     Status: Abnormal   Collection Time: 02/06/20  4:20 PM  Result Value Ref Range   Sodium 137 135 - 145 mmol/L   Potassium 3.5 3.5 - 5.1 mmol/L   Chloride 101 98 - 111 mmol/L   CO2 25 22 - 32 mmol/L   Glucose, Bld 141 (H) 70 - 99 mg/dL    Comment: Glucose reference range applies only to samples taken after fasting for at least 8 hours.   BUN 9 6 - 20 mg/dL   Creatinine, Ser 0.66 0.44 - 1.00 mg/dL   Calcium 8.9 8.9 - 10.3 mg/dL   Total Protein 7.0 6.5 - 8.1 g/dL   Albumin 3.3 (L) 3.5 - 5.0 g/dL   AST 27 15 - 41 U/L   ALT 13 0 - 44 U/L   Alkaline Phosphatase 129 (H) 38 - 126 U/L   Total Bilirubin 0.8 0.3 - 1.2 mg/dL   GFR calc non Af Amer >60 >60 mL/min   GFR calc Af Amer >60 >60 mL/min   Anion gap 11 5 - 15    Comment: Performed at [facility], [address] [place], [place] [address]  CBC     Status: Abnormal   Collection Time: 02/06/20  4:20 PM  Result Value Ref Range   WBC 4.8 4.0 - 10.5 K/uL   RBC 3.73 (L) 3.87 - 5.11 MIL/uL   Hemoglobin 11.5 (L) 12.0 - 15.0 g/dL   HCT 35.0 (L) 36.0 - 46.0 %  MCV 93.8 80.0 - 100.0 fL   MCH 30.8 26.0 - 34.0 pg   MCHC 32.9 30.0 - 36.0 g/dL   RDW 13.7 11.5 - 15.5 %   Platelets 188 150 - 400 K/uL   nRBC 0.0 0.0 - 0.2 %    Comment: Performed at [facility], [place], [place] [address]  Troponin I (High Sensitivity)     Status: None   Collection Time: 02/06/20  4:20 PM  Result Value Ref Range   Troponin I (High  Sensitivity) 11 <18 ng/L    Comment: (NOTE) Elevated high sensitivity troponin I (hsTnI) values and significant  changes across serial measurements may suggest ACS but many other  chronic and acute conditions are known to elevate hsTnI results.  Refer to the "Links" section for chest pain algorithms and additional  guidance. Performed at [facility], [address] [place], [place] [address]   Magnesium     Status: Abnormal   Collection Time: 02/10/20  8:09 AM  Result Value Ref Range   Magnesium 1.6 (L) 1.7 - 2.4 mg/dL    Comment: Performed at [facility], [address] [place],  [address]  Comprehensive metabolic panel     Status: Abnormal   Collection Time: 02/10/20  8:09 AM  Result Value Ref Range   Sodium 136 135 - 145 mmol/L   Potassium 4.0 3.5 - 5.1 mmol/L   Chloride 99 98 - 111 mmol/L   CO2 27 22 - 32 mmol/L   Glucose, Bld 107 (H) 70 - 99 mg/dL    Comment: Glucose reference range applies only to samples taken after fasting for at least 8 hours.   BUN 11 6 - 20 mg/dL   Creatinine, Ser 0.63 0.44 - 1.00 mg/dL   Calcium 8.6 (L) 8.9 - 10.3 mg/dL   Total Protein 7.1 6.5 - 8.1 g/dL   Albumin 3.2 (L) 3.5 - 5.0 g/dL   AST 30 15 - 41 U/L   ALT 22 0 - 44 U/L   Alkaline Phosphatase 151 (H) 38 - 126 U/L   Total Bilirubin 0.4 0.3 - 1.2 mg/dL   GFR calc non Af Amer >60 >60 mL/min   GFR calc Af Amer >60 >60 mL/min   Anion gap 10 5 - 15    Comment: Performed at [facility], [address] [place],  [address]  CBC with Differential     Status: Abnormal   Collection Time: 02/10/20  8:09 AM  Result Value Ref Range   WBC 4.4 4.0 - 10.5 K/uL   RBC 3.66 (L) 3.87 - 5.11 MIL/uL   Hemoglobin 11.3 (L) 12.0 - 15.0 g/dL   HCT 35.5 (L) 36.0 - 46.0 %   MCV 97.0 80.0 - 100.0 fL   MCH 30.9 26.0 - 34.0 pg   MCHC 31.8 30.0 - 36.0 g/dL   RDW 13.8 11.5 - 15.5 %   Platelets 179 150 - 400 K/uL   nRBC 0.0 0.0 - 0.2 %   Neutrophils Relative % 65 %    Neutro Abs 2.8 1.7 - 7.7 K/uL   Lymphocytes Relative 26 %   Lymphs Abs 1.2 0.7 - 4.0 K/uL   Monocytes Relative 6 %   Monocytes Absolute 0.3 0.1 - 1.0 K/uL   Eosinophils Relative 2 %   Eosinophils Absolute 0.1 0.0 - 0.5 K/uL   Basophils Relative 1 %   Basophils Absolute 0.0 0.0 - 0.1 K/uL   Immature Granulocytes 0 %  Abs Immature Granulocytes 0.01 0.00 - 0.07 K/uL    Comment: Performed at [facility], [address], [place], [place] [address]    Radiology CT ABDOMEN PELVIS W CONTRAST  Result Date: 01/14/2020 CLINICAL DATA:  Right-sided abdominal and pelvic pain. Right lower quadrant and low back pain. EXAM: CT ABDOMEN AND PELVIS WITH CONTRAST TECHNIQUE: Multidetector CT imaging of the abdomen and pelvis was performed using the standard protocol following bolus administration of intravenous contrast. CONTRAST:  142m OMNIPAQUE IOHEXOL 300 MG/ML  SOLN COMPARISON:  12/28/2019 FINDINGS: Lower chest: Numerous nodules again identified in the lung bases, some of which are cavitary. No substantial change compared to the study from 2 weeks ago. Hepatobiliary: No suspicious focal abnormality within the liver parenchyma. Mild intrahepatic biliary duct prominence is stable. Extrahepatic biliary dilatation is stable. Common bile duct measures 13 mm in the head of pancreas. Pancreas: No focal mass lesion. No dilatation of the main duct. No intraparenchymal cyst. No peripancreatic edema. Spleen: No splenomegaly. No focal mass lesion. Adrenals/Urinary Tract: No adrenal nodule or mass. Right kidney unremarkable. Moderate left hydroureteronephrosis is stable since prior with ureteral dilatation extending down into the lower pelvis. Distal ureter not well demonstrated on axial imaging, but coronal imaging suggests enhancing soft tissue in the distal left ureter (coronal image 51/series 5) just proximal to the UVJ. Mild left-sided bladder wall thickening is best appreciated on coronal imaging. Stomach/Bowel:  Stomach is unremarkable. No gastric wall thickening. No evidence of outlet obstruction. Duodenum is normally positioned as is the ligament of Treitz. No small bowel wall thickening. No small bowel dilatation. Status post right hemicolectomy transverse colon is similar to prior, mildly distended and fluid-filled with abrupt transition to decompressed colon with wall thickening in the region of the distal transverse segment/splenic flexure. Left colon remains decompressed down to the rectum. Vascular/Lymphatic: There is abdominal aortic atherosclerosis without aneurysm. IVC filter visualized in situ. There is no gastrohepatic or hepatoduodenal ligament lymphadenopathy. No retroperitoneal or mesenteric lymphadenopathy. No pelvic sidewall lymphadenopathy. Reproductive: Similar appearance of uterus and cervix, remaining ill-defined. Other: No intraperitoneal free fluid. Stranding in the lower pelvis likely treatment related. This is stable. Musculoskeletal: Probable sacral insufficiency fractures with stable sclerosis in the anterior left pubic bone. IMPRESSION: 1. No substantial change in exam. 2. Persistent moderate left hydroureteronephrosis with apparent enhancing soft tissue at the distal left ureter just proximal to the UVJ. Urothelial neoplasm or involvement by known cervical cancer not excluded. 3. Similar appearance of numerous pulmonary nodules in the lung bases, some of which are cavitary. Imaging features remain compatible with metastatic disease. 4. Left colon remains decompressed which may account for the apparent circumferential wall thickening. Infectious/inflammatory colitis cannot be excluded. 5. Similar appearance of ill-defined uterus and cervix. 6. Aortic Atherosclerosis (ICD10-I70.0). Electronically Signed   By: EMisty StanleyM.D.   On: 01/14/2020 12:13   [place]ous Img Lower Unilateral Right  Result Date: 01/14/2020 CLINICAL DATA:  Right lower extremity pain and edema. History of prior DVT.  Right knee arthroplasty earlier this month. EXAM: RIGHT LOWER EXTREMITY VENOUS DOPPLER ULTRASOUND TECHNIQUE: Gray-scale sonography with graded compression, as well as color Doppler and duplex ultrasound were performed to evaluate the lower extremity deep venous systems from the level of the common femoral vein and including the common femoral, femoral, profunda femoral, popliteal and calf veins including the posterior tibial, peroneal and gastrocnemius veins when visible. The superficial great saphenous vein was also interrogated. Spectral Doppler was utilized to evaluate flow at rest and with  distal augmentation maneuvers in the common femoral, femoral and popliteal veins. COMPARISON:  None. FINDINGS: Contralateral Common Femoral Vein: Respiratory phasicity is normal and symmetric with the symptomatic side. No evidence of thrombus. Normal compressibility. Common Femoral Vein: No evidence of thrombus. Normal compressibility, respiratory phasicity and response to augmentation. Saphenofemoral Junction: No evidence of thrombus. Normal compressibility and flow on color Doppler imaging. Profunda Femoral Vein: No evidence of thrombus. Normal compressibility and flow on color Doppler imaging. Femoral Vein: No evidence of thrombus. Normal compressibility, respiratory phasicity and response to augmentation. Popliteal Vein: No evidence of thrombus. Normal compressibility, respiratory phasicity and response to augmentation. Calf Veins: No evidence of thrombus. Normal compressibility and flow on color Doppler imaging. Superficial Great Saphenous Vein: No evidence of thrombus. Normal compressibility. Venous Reflux:  None. Other Findings: No evidence of superficial thrombophlebitis. Small complex fluid collection in the right popliteal fossa measuring just over 2 cm likely relates to the recent knee surgery. IMPRESSION: No evidence of right lower extremity deep venous thrombosis. Electronically Signed   By: Aletta Edouard M.D.    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  She is now back on her anticoagulation and does not currently have a DVT.  She will maintain on anticoagulation at this point.  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  Would be at higher risk of IVC filter thrombosis due to her malignancy if her anticoagulation lapses.  Would be a stronger reason to remove her IVC filter.  Essential hypertension blood pressure control important in reducing the progression of atherosclerotic disease. On appropriate oral medications.   Aortic atherosclerosis (HCC) No current limb threatening symptoms from her peripheral vascular disease.  No role for intervention.  On anticoagulation  S/P IVC filter To evaluate her lower extremities, noninvasive studies were performed today.  No DVT or superficial thrombophlebitis was seen in either lower extremity.  She has now resumed anticoagulation and no longer needs her IVC filter.  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  For this reason, would strongly recommend filter removal.  Risks and benefits of filter removal were discussed with the patient in detail and she is agreeable to proceed.  This will be scheduled for the near future at her convenience.    Leotis Pain, MD  02/13/2020 9:54 AM    This note was created with Dragon medical transcription system.  Any errors from dictation are purely unintentional

## 2020-02-13 NOTE — Assessment & Plan Note (Signed)
No current limb threatening symptoms from her peripheral vascular disease.  No role for intervention.  On anticoagulation

## 2020-02-18 ENCOUNTER — Inpatient Hospital Stay: Payer: Medicaid Other | Attending: Hospice and Palliative Medicine | Admitting: Hospice and Palliative Medicine

## 2020-02-18 DIAGNOSIS — Z515 Encounter for palliative care: Secondary | ICD-10-CM | POA: Diagnosis not present

## 2020-02-18 DIAGNOSIS — C7800 Secondary malignant neoplasm of unspecified lung: Secondary | ICD-10-CM | POA: Insufficient documentation

## 2020-02-18 DIAGNOSIS — C539 Malignant neoplasm of cervix uteri, unspecified: Secondary | ICD-10-CM | POA: Insufficient documentation

## 2020-02-18 DIAGNOSIS — G893 Neoplasm related pain (acute) (chronic): Secondary | ICD-10-CM | POA: Diagnosis not present

## 2020-02-18 DIAGNOSIS — Z79899 Other long term (current) drug therapy: Secondary | ICD-10-CM | POA: Insufficient documentation

## 2020-02-18 DIAGNOSIS — Z923 Personal history of irradiation: Secondary | ICD-10-CM | POA: Insufficient documentation

## 2020-02-18 DIAGNOSIS — C538 Malignant neoplasm of overlapping sites of cervix uteri: Secondary | ICD-10-CM

## 2020-02-18 DIAGNOSIS — Z9221 Personal history of antineoplastic chemotherapy: Secondary | ICD-10-CM | POA: Insufficient documentation

## 2020-02-18 DIAGNOSIS — E860 Dehydration: Secondary | ICD-10-CM | POA: Insufficient documentation

## 2020-02-18 DIAGNOSIS — E876 Hypokalemia: Secondary | ICD-10-CM | POA: Insufficient documentation

## 2020-02-18 NOTE — Progress Notes (Signed)
Virtual Visit via Telephone Note  I connected with Joanna Hall on 02/18/20 at 10:30 AM EDT by telephone and verified that I am speaking with the correct person using two identifiers.   I discussed the limitations, risks, security and privacy concerns of performing an evaluation and management service by telephone and the availability of in person appointments. I also discussed with the patient that there may be a patient responsible charge related to this service. The patient expressed understanding and agreed to proceed.   History of Present Illness: 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 Patient has multiple recent hospitalizations and ER visits for nausea and abdominal pain.    Observations/Objective: I called and spoke with patient by phone.  Patient says that she is doing much better.  She plans to start outpatient rehab at her orthopedic office next week.  She says that her performance status is better, although she remains quite fatigued.  She says pain is significantly improved.  She denies any acute issues or changes today.  No symptomatic complaints.  Plan is for follow-up medical oncology on 5/18.  Assessment and Plan: Stage IV cervical cancer -on Alimta but treatment currently held s/p TKR.  Clinic visit on 5/18.  Neoplasm related pain -stable on oxycodone/OxyContin.  Follow Up Instructions: Follow-up virtual visit in 1 month   I discussed the assessment and treatment plan with the patient. The patient was provided an opportunity to ask questions and all were answered. The patient agreed with the plan and demonstrated an understanding of the instructions.   The patient was advised to call back or seek an in-person  evaluation if the symptoms worsen or if the condition fails to improve as anticipated.  I provided 5 minutes of non-face-to-face time during this encounter.   Irean Hong, NP

## 2020-02-19 ENCOUNTER — Other Ambulatory Visit
Admission: RE | Admit: 2020-02-19 | Discharge: 2020-02-19 | Disposition: A | Discharge status: Home / Self Care | Payer: Medicaid Other | Source: Ambulatory Visit | Attending: Vascular Surgery | Admitting: Vascular Surgery

## 2020-02-19 DIAGNOSIS — Z01812 Encounter for preprocedural laboratory examination: Secondary | ICD-10-CM | POA: Diagnosis not present

## 2020-02-19 DIAGNOSIS — Z20822 Contact with and (suspected) exposure to covid-19: Secondary | ICD-10-CM | POA: Diagnosis not present

## 2020-02-19 LAB — SARS CORONAVIRUS 2 (TAT 6-24 HRS): SARS Coronavirus 2: NEGATIVE

## 2020-02-20 ENCOUNTER — Other Ambulatory Visit (INDEPENDENT_AMBULATORY_CARE_PROVIDER_SITE_OTHER): Payer: Self-pay | Admitting: Nurse Practitioner

## 2020-02-23 ENCOUNTER — Encounter: Payer: Self-pay | Admitting: Vascular Surgery

## 2020-02-23 ENCOUNTER — Ambulatory Visit
Admission: RE | Admit: 2020-02-23 | Discharge: 2020-02-23 | Disposition: A | Discharge status: Home / Self Care | Payer: Medicaid Other | Attending: Vascular Surgery | Admitting: Vascular Surgery

## 2020-02-23 ENCOUNTER — Other Ambulatory Visit: Payer: Self-pay

## 2020-02-23 ENCOUNTER — Encounter
Admission: RE | Disposition: A | Discharge status: Home / Self Care | Payer: Self-pay | Source: Home / Self Care | Attending: Vascular Surgery

## 2020-02-23 DIAGNOSIS — Z888 Allergy status to other drugs, medicaments and biological substances status: Secondary | ICD-10-CM | POA: Diagnosis not present

## 2020-02-23 DIAGNOSIS — D6851 Activated protein C resistance: Secondary | ICD-10-CM | POA: Diagnosis not present

## 2020-02-23 DIAGNOSIS — Z87891 Personal history of nicotine dependence: Secondary | ICD-10-CM | POA: Diagnosis not present

## 2020-02-23 DIAGNOSIS — D538 Other specified nutritional anemias: Secondary | ICD-10-CM | POA: Insufficient documentation

## 2020-02-23 DIAGNOSIS — Z7901 Long term (current) use of anticoagulants: Secondary | ICD-10-CM | POA: Insufficient documentation

## 2020-02-23 DIAGNOSIS — K219 Gastro-esophageal reflux disease without esophagitis: Secondary | ICD-10-CM | POA: Diagnosis not present

## 2020-02-23 DIAGNOSIS — Z96653 Presence of artificial knee joint, bilateral: Secondary | ICD-10-CM | POA: Diagnosis not present

## 2020-02-23 DIAGNOSIS — Z79899 Other long term (current) drug therapy: Secondary | ICD-10-CM | POA: Insufficient documentation

## 2020-02-23 DIAGNOSIS — Z8249 Family history of ischemic heart disease and other diseases of the circulatory system: Secondary | ICD-10-CM | POA: Diagnosis not present

## 2020-02-23 DIAGNOSIS — I700 Atherosclerosis of aorta: Secondary | ICD-10-CM | POA: Diagnosis not present

## 2020-02-23 DIAGNOSIS — Z4589 Encounter for adjustment and management of other implanted devices: Secondary | ICD-10-CM | POA: Diagnosis present

## 2020-02-23 DIAGNOSIS — Z86718 Personal history of other venous thrombosis and embolism: Secondary | ICD-10-CM | POA: Insufficient documentation

## 2020-02-23 DIAGNOSIS — I82409 Acute embolism and thrombosis of unspecified deep veins of unspecified lower extremity: Secondary | ICD-10-CM

## 2020-02-23 HISTORY — DX: Malignant neoplasm of unspecified part of unspecified bronchus or lung: C34.90

## 2020-02-23 HISTORY — PX: IVC FILTER REMOVAL: CATH118246

## 2020-02-23 LAB — CREATININE, SERUM
Creatinine, Ser: 0.74 mg/dL (ref 0.44–1.00)
GFR calc Af Amer: >60 mL/min (ref >60)
GFR calc non Af Amer: >60 mL/min (ref >60)

## 2020-02-23 LAB — BUN: BUN: 11 mg/dL (ref 6–20)

## 2020-02-23 SURGERY — IVC FILTER REMOVAL
Anesthesia: Moderate Sedation

## 2020-02-23 MED ORDER — METHYLPREDNISOLONE SODIUM SUCC 125 MG IJ SOLR: 125.0000 mg | Freq: Once | INTRAMUSCULAR | Status: DC | PRN

## 2020-02-23 MED ORDER — FENTANYL CITRATE (PF) 100 MCG/2ML IJ SOLN
INTRAMUSCULAR | Status: AC
  Filled 2020-02-23: qty 2

## 2020-02-23 MED ORDER — IODIXANOL 320 MG/ML IV SOLN
INTRAVENOUS | Status: DC | PRN
  Administered 2020-02-23: 20 mL via INTRA_ARTERIAL

## 2020-02-23 MED ORDER — HYDROMORPHONE HCL 1 MG/ML IJ SOLN: 1.0000 mg | Freq: Once | INTRAMUSCULAR | Status: DC | PRN

## 2020-02-23 MED ORDER — ONDANSETRON HCL 4 MG/2ML IJ SOLN: 4.0000 mg | Freq: Four times a day (QID) | INTRAMUSCULAR | Status: DC | PRN

## 2020-02-23 MED ORDER — SODIUM CHLORIDE 0.9 % IV SOLN: INTRAVENOUS | Status: DC

## 2020-02-23 MED ORDER — MIDAZOLAM HCL 5 MG/5ML IJ SOLN
INTRAMUSCULAR | Status: AC
  Filled 2020-02-23: qty 5

## 2020-02-23 MED ORDER — CEFAZOLIN SODIUM-DEXTROSE 2-4 GM/100ML-% IV SOLN
2.0000 g | Freq: Once | INTRAVENOUS | Status: AC
  Administered 2020-02-23: 09:00:00 2 g via INTRAVENOUS

## 2020-02-23 MED ORDER — MIDAZOLAM HCL 2 MG/ML PO SYRP: 8.0000 mg | ORAL_SOLUTION | Freq: Once | ORAL | Status: DC | PRN

## 2020-02-23 MED ORDER — FENTANYL CITRATE (PF) 100 MCG/2ML IJ SOLN
INTRAMUSCULAR | Status: DC | PRN
  Administered 2020-02-23 (×2): 25 ug via INTRAVENOUS
  Administered 2020-02-23: 50 ug via INTRAVENOUS

## 2020-02-23 MED ORDER — FAMOTIDINE 20 MG PO TABS: 40.0000 mg | ORAL_TABLET | Freq: Once | ORAL | Status: DC | PRN

## 2020-02-23 MED ORDER — MIDAZOLAM HCL 2 MG/2ML IJ SOLN
INTRAMUSCULAR | Status: DC | PRN
  Administered 2020-02-23: 2 mg via INTRAVENOUS
  Administered 2020-02-23 (×3): 1 mg via INTRAVENOUS

## 2020-02-23 MED ORDER — HEPARIN SOD (PORK) LOCK FLUSH 100 UNIT/ML IV SOLN: 500.0000 [IU] | Freq: Once | INTRAVENOUS | Status: AC

## 2020-02-23 MED ORDER — CEFAZOLIN SODIUM-DEXTROSE 2-4 GM/100ML-% IV SOLN
INTRAVENOUS | Status: AC
  Filled 2020-02-23: qty 100

## 2020-02-23 MED ORDER — HEPARIN SOD (PORK) LOCK FLUSH 100 UNIT/ML IV SOLN
INTRAVENOUS | Status: AC
  Administered 2020-02-23: 11:00:00 500 [IU]
  Filled 2020-02-23: qty 5

## 2020-02-23 MED ORDER — DIPHENHYDRAMINE HCL 50 MG/ML IJ SOLN: 50.0000 mg | Freq: Once | INTRAMUSCULAR | Status: DC | PRN

## 2020-02-23 SURGICAL SUPPLY — 5 items
CANNULA 5F STIFF (CANNULA) ×2 IMPLANT
GLIDEWIRE ADV .035X180CM (WIRE) ×2 IMPLANT
PACK ANGIOGRAPHY (CUSTOM PROCEDURE TRAY) ×2 IMPLANT
SET VENACAVA FILTER RETRIEVAL (MISCELLANEOUS) ×2 IMPLANT
WIRE J 3MM .035X145CM (WIRE) ×2 IMPLANT

## 2020-02-23 NOTE — Discharge Instructions (Signed)
Inferior Vena Cava Filter Removal, Care After This sheet gives you information about how to care for yourself after your procedure. Your health care provider may also give you more specific instructions. If you have problems or questions, contact your health care provider. What can I expect after the procedure? After the procedure, it is common to have:  Mild pain and bruising around your incision in your neck or groin.  Fatigue. Follow these instructions at home: Incision care   Follow instructions from your health care provider about how to take care of your incision. Make sure you: ? Wash your hands with soap and water before you change your bandage (dressing). If soap and water are not available, use hand sanitizer. ? Change your dressing as told by your health care provider.  Check your incision area every day for signs of infection. Check for: ? Redness, swelling, or more pain. ? Fluid or blood. ? Warmth. ? Pus or a bad smell. General instructions  Take over-the-counter and prescription medicines only as told by your health care provider.  Do not take baths, swim, or use a hot tub until your health care provider approves. Ask your health care provider if you may take showers. You may only be allowed to take sponge baths.  Do not drive for 24 hours if you were given a medicine to help you relax (sedative) during your procedure.  Return to your normal activities as told by your health care provider. Ask your health care provider what activities are safe for you.  Keep all follow-up visits as told by your health care provider. This is important. Contact a health care provider if:  You have chills or a fever.  You have redness, swelling, or more pain around your incision.  Your incision feels warm to the touch.  You have pus or a bad smell coming from your incision. Get help right away if:  You have blood coming from your incision (active bleeding). ? If you have  bleeding from the incision site, lie down, apply pressure to the area with a clean cloth or gauze, and get help right away.  You have chest pain.  You have difficulty breathing. Summary  Follow instructions from your health care provider about how to take care of your incision.  Return to your normal activities as told by your health care provider.  Check your incision area every day for signs of infection.  Get help right away if you have active bleeding, chest pain, or trouble breathing. This information is not intended to replace advice given to you by your health care provider. Make sure you discuss any questions you have with your health care provider. Document Revised: 09/14/2017 Document Reviewed: 04/12/2017 Elsevier Patient Education  [place].    Moderate Conscious Sedation, Adult, Care After These instructions provide you with information about caring for yourself after your procedure. Your health care provider may also give you more specific instructions. Your treatment has been planned according to current medical practices, but problems sometimes occur. Call your health care provider if you have any problems or questions after your procedure. What can I expect after the procedure? After your procedure, it is common:  To feel sleepy for several hours.  To feel clumsy and have poor balance for several hours.  To have poor judgment for several hours.  To vomit if you eat too soon. Follow these instructions at home: For at least 24 hours after the procedure:   Do not: ?  Participate in activities where you could fall or become injured. ? Drive. ? Use heavy machinery. ? Drink alcohol. ? Take sleeping pills or medicines that cause drowsiness. ? Make important decisions or sign legal documents. ? Take care of children on your own.  Rest. Eating and drinking  Follow the diet recommended by your health care provider.  If you vomit: ? Drink water, juice,  or soup when you can drink without vomiting. ? Make sure you have little or no nausea before eating solid foods. General instructions  Have a responsible adult stay with you until you are awake and alert.  Take over-the-counter and prescription medicines only as told by your health care provider.  If you smoke, do not smoke without supervision.  Keep all follow-up visits as told by your health care provider. This is important. Contact a health care provider if:  You keep feeling nauseous or you keep vomiting.  You feel light-headed.  You develop a rash.  You have a fever. Get help right away if:  You have trouble breathing. This information is not intended to replace advice given to you by your health care provider. Make sure you discuss any questions you have with your health care provider. Document Revised: 09/14/2017 Document Reviewed: 01/22/2016 Elsevier Patient Education  2020 [employer].

## 2020-02-23 NOTE — Op Note (Signed)
[facility]   OPERATIVE NOTE    PRE-OPERATIVE DIAGNOSIS:  1. DVT 2. status post IVC filter placement  POST-OPERATIVE DIAGNOSIS: Same as above  PROCEDURE: 1. Ultrasound guidance for vascular access left jugular vein 2. Catheter placement into inferior vena cava from right jugular vein 3. Inferior venacavogram and left jugular venogram and superior venacavogram 4. Retrieval of Saunders IVC filter  SURGEON: Leotis Pain, MD  ASSISTANT(S): None  ANESTHESIA: Local with moderate conscious sedation for approximately 20 minutes using 5 mg of Versed and 100 mcg of Fentanyl  ESTIMATED BLOOD LOSS: 3 cc  CONTRAST:  20 cc  FLUORO TIME: 0.9 minutes  FINDING(S): 1. Left jugular vein and superior vena cava were patent with expected tortuosity.   2.  IVC was patent with no thrombus in the filter.  SPECIMEN(S): IVC filter  INDICATIONS:  Patient is a 54 y.o. female who presents with a previous history of IVC filter placement for DVT with a hematoma precluding anticoagulation. Patient has resumed her anticoagulation and no longer needs this filter. The patient remains on anticoagulation. Risks and benefits were discussed, and informed consent was obtained.  DESCRIPTION: After obtaining full informed written consent, the patient was brought back to the vascular suite and placed supine upon the table.Moderate conscious sedation was administered during a face to face encounter with the patient throughout the procedure with my supervision of the RN administering medicines and monitoring the patient's vital signs, pulse oximetry, telemetry and mental status throughout from the start of the procedure until the patient was taken to the recovery room.  After obtaining adequate anesthesia, the patient was prepped and draped in the standard fashion. The left jugular vein was visualized with ultrasound and found to be widely patent. It was then accessed under direct  ultrasound guidance without difficulty with the like a puncture needle and a permanent image was recorded.  A micropuncture wire and sheath were then placed.  A J-wire was placed but would not easily go, so an image was performed to the micropuncture sheath of the left jugular vein and superior vena cava showing this to be widely patent with some moderate tortuosity.  I then advanced an advantage wire without difficulty through the left jugular vein and superior vena cava and then down through the atrium into the inferior vena cava. After skin nick and dilatation, the retrieval sheath was placed over the wire and advanced into the inferior vena cava. Inferior vena cava was imaged and found to be widely patent on inferior venacavogram. The filter was mildly tilted in its orientation. The retrieval snare was then placed through the sheath and the hook of the filter was snared without difficulty. The sheath was then advanced, and the filter was collapsed and brought into the sheath in its entirety. It was then removed from the body in its entirety. The retrieval sheath was then removed. Pressure was held at the access site and sterile dressing was placed. The patient was taken to the recovery room in stable condition having tolerated the procedure well.  COMPLICATIONS: None  CONDITION: Stable   Leotis Pain 02/23/2020 9:48 AM  This note was created with Dragon Medical transcription system. Any errors in dictation are purely unintentional.

## 2020-02-23 NOTE — H&P (Signed)
[facility] History & Physical Update  The patient was interviewed and re-examined.  The patient's previous History and Physical has been reviewed and is unchanged.  There is no change in the plan of care. We plan to proceed with the scheduled procedure.  Leotis Pain, MD  02/23/2020, 8:10 AM

## 2020-02-24 ENCOUNTER — Telehealth: Payer: Self-pay | Admitting: *Deleted

## 2020-02-24 DIAGNOSIS — C538 Malignant neoplasm of overlapping sites of cervix uteri: Secondary | ICD-10-CM

## 2020-02-24 DIAGNOSIS — R197 Diarrhea, unspecified: Secondary | ICD-10-CM

## 2020-02-24 NOTE — Telephone Encounter (Signed)
Patient called to request for an apt in [facility] for dehydration-nausea and vomiting. She owuld like iv fluids. I Spoke with Sonia Baller, NP and Doni, RN - orders obtained to sch. Apt tomorrow in clinic for [place] NP/possible iv fluids. (cbc, metc, mag). Pt given apt for 10 am.

## 2020-02-25 ENCOUNTER — Inpatient Hospital Stay (HOSPITAL_BASED_OUTPATIENT_CLINIC_OR_DEPARTMENT_OTHER): Payer: Medicaid Other | Admitting: Oncology

## 2020-02-25 ENCOUNTER — Inpatient Hospital Stay: Payer: Medicaid Other

## 2020-02-25 ENCOUNTER — Other Ambulatory Visit: Payer: Self-pay

## 2020-02-25 VITALS — BP 108/76 | HR 111 | Temp 96.9°F | Wt 144.0 lb

## 2020-02-25 DIAGNOSIS — Z95828 Presence of other vascular implants and grafts: Secondary | ICD-10-CM

## 2020-02-25 DIAGNOSIS — R109 Unspecified abdominal pain: Secondary | ICD-10-CM

## 2020-02-25 DIAGNOSIS — R112 Nausea with vomiting, unspecified: Secondary | ICD-10-CM

## 2020-02-25 DIAGNOSIS — G893 Neoplasm related pain (acute) (chronic): Secondary | ICD-10-CM | POA: Diagnosis not present

## 2020-02-25 DIAGNOSIS — N1330 Unspecified hydronephrosis: Secondary | ICD-10-CM | POA: Diagnosis not present

## 2020-02-25 DIAGNOSIS — C538 Malignant neoplasm of overlapping sites of cervix uteri: Secondary | ICD-10-CM | POA: Diagnosis not present

## 2020-02-25 DIAGNOSIS — Z79899 Other long term (current) drug therapy: Secondary | ICD-10-CM | POA: Diagnosis not present

## 2020-02-25 DIAGNOSIS — E860 Dehydration: Secondary | ICD-10-CM | POA: Diagnosis not present

## 2020-02-25 DIAGNOSIS — C7800 Secondary malignant neoplasm of unspecified lung: Secondary | ICD-10-CM | POA: Diagnosis not present

## 2020-02-25 DIAGNOSIS — C539 Malignant neoplasm of cervix uteri, unspecified: Secondary | ICD-10-CM | POA: Diagnosis present

## 2020-02-25 DIAGNOSIS — I951 Orthostatic hypotension: Secondary | ICD-10-CM

## 2020-02-25 DIAGNOSIS — R1030 Lower abdominal pain, unspecified: Secondary | ICD-10-CM

## 2020-02-25 DIAGNOSIS — Z9221 Personal history of antineoplastic chemotherapy: Secondary | ICD-10-CM | POA: Diagnosis not present

## 2020-02-25 DIAGNOSIS — E876 Hypokalemia: Secondary | ICD-10-CM | POA: Diagnosis not present

## 2020-02-25 DIAGNOSIS — Z923 Personal history of irradiation: Secondary | ICD-10-CM | POA: Diagnosis not present

## 2020-02-25 LAB — COMPREHENSIVE METABOLIC PANEL
ALT: 36 U/L (ref 0–44)
AST: 46 U/L — ABNORMAL HIGH (ref 15–41)
Albumin: 3.3 g/dL — ABNORMAL LOW (ref 3.5–5.0)
Alkaline Phosphatase: 203 U/L — ABNORMAL HIGH (ref 38–126)
Anion gap: 8 (ref 5–15)
BUN: 9 mg/dL (ref 6–20)
CO2: 26 mmol/L (ref 22–32)
Calcium: 8.9 mg/dL (ref 8.9–10.3)
Chloride: 103 mmol/L (ref 98–111)
Creatinine, Ser: 0.75 mg/dL (ref 0.44–1.00)
GFR calc Af Amer: >60 mL/min (ref >60)
GFR calc non Af Amer: >60 mL/min (ref >60)
Glucose, Bld: 89 mg/dL (ref 70–99)
Potassium: 4 mmol/L (ref 3.5–5.1)
Sodium: 137 mmol/L (ref 135–145)
Total Bilirubin: 0.6 mg/dL (ref 0.3–1.2)
Total Protein: 7 g/dL (ref 6.5–8.1)

## 2020-02-25 LAB — CBC WITH DIFFERENTIAL/PLATELET
Abs Immature Granulocytes: 0.01 10*3/uL (ref 0.00–0.07)
Basophils Absolute: 0 10*3/uL (ref 0.0–0.1)
Basophils Relative: 0 %
Eosinophils Absolute: 0.1 10*3/uL (ref 0.0–0.5)
Eosinophils Relative: 2 %
HCT: 35.2 % — ABNORMAL LOW (ref 36.0–46.0)
Hemoglobin: 11.7 g/dL — ABNORMAL LOW (ref 12.0–15.0)
Immature Granulocytes: 0 %
Lymphocytes Relative: 36 %
Lymphs Abs: 1 10*3/uL (ref 0.7–4.0)
MCH: 31.4 pg (ref 26.0–34.0)
MCHC: 33.2 g/dL (ref 30.0–36.0)
MCV: 94.4 fL (ref 80.0–100.0)
Monocytes Absolute: 0.2 10*3/uL (ref 0.1–1.0)
Monocytes Relative: 7 %
Neutro Abs: 1.6 10*3/uL — ABNORMAL LOW (ref 1.7–7.7)
Neutrophils Relative %: 55 %
Platelets: 177 10*3/uL (ref 150–400)
RBC: 3.73 MIL/uL — ABNORMAL LOW (ref 3.87–5.11)
RDW: 14 % (ref 11.5–15.5)
WBC: 2.9 10*3/uL — ABNORMAL LOW (ref 4.0–10.5)
nRBC: 0 % (ref 0.0–0.2)

## 2020-02-25 LAB — MAGNESIUM: Magnesium: 1.4 mg/dL — ABNORMAL LOW (ref 1.7–2.4)

## 2020-02-25 MED ORDER — HEPARIN SOD (PORK) LOCK FLUSH 100 UNIT/ML IV SOLN
500.0000 [IU] | Freq: Once | INTRAVENOUS | Status: AC
  Administered 2020-02-25: 13:00:00 500 [IU]
  Filled 2020-02-25: qty 5

## 2020-02-25 MED ORDER — SODIUM CHLORIDE 0.9 % IV SOLN
Freq: Once | INTRAVENOUS | Status: AC
  Filled 2020-02-25: qty 250

## 2020-02-25 MED ORDER — SODIUM CHLORIDE 0.9% FLUSH
10.0000 mL | INTRAVENOUS | Status: DC | PRN
  Administered 2020-02-25: 10 mL via INTRAVENOUS
  Filled 2020-02-25: qty 10

## 2020-02-25 MED ORDER — MAGNESIUM SULFATE 4 GM/100ML IV SOLN
4.0000 g | Freq: Once | INTRAVENOUS | Status: AC
  Administered 2020-02-25: 4 g via INTRAVENOUS
  Filled 2020-02-25: qty 100

## 2020-02-25 MED ORDER — MORPHINE SULFATE (PF) 2 MG/ML IV SOLN
2.0000 mg | Freq: Once | INTRAVENOUS | Status: AC
  Administered 2020-02-25: 2 mg via INTRAVENOUS
  Filled 2020-02-25: qty 1

## 2020-02-25 NOTE — Progress Notes (Signed)
Symptom Management Consult note [facility]  Telephone:([phone]) [phone] Fax:([phone]) [phone]  Patient Care Team: Towanda Malkin, MD as PCP - General (Internal Medicine) Mellody Drown, MD as Consulting Physician (Obstetrics and Gynecology) [name], MD as Consulting Physician (Gastroenterology) Michael Boston, MD as Consulting Physician (General Surgery) Lovell Sheehan, MD as Consulting Physician (Orthopedic Surgery) Cammie Sickle, MD as Consulting Physician (Oncology) Earnestine Leys, MD as Consulting Physician (Orthopedic Surgery) Jason Coop, NP as Nurse Practitioner Borders, Kirt Boys, NP as Nurse Practitioner [facility] and Palliative Medicine)   Name of the patient: Joanna Hall  [id number]  November 17, 1965   Date of visit: 02/25/2020   Diagnosis-stage IV cervical cancer  Chief complaint/ Reason for visit-weakness  Heme/Onc history: Oncology History Overview Note  # dec 2017- CERVICAL ADENO CA [[place]]; Stage IB [Dr. Christene Slates at [facility] in [place], [place];No surgery- Chemo-RT;   # 2018-sep - lung nodules [in [place]]  # AUG 20th, 2019-#1 Carbo-Taxol s/p 6 cycles- [No avastin sec to blood clots]: Feb 2020-bilateral lung nodules with 1 cm in size. March 2020- finished carbo-taxol #7; poor tolerance to [place].  # April 15 th 2020- Start Taxol weekly x 3; one week OFF;  # July 2020-CT- bil cavitary lesions- slightly bigger by few mm/overall STABLE; CT a/p-NED. 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  She received treatment from 11/2016-05/2017.  01/2017 cisplatin x 2 and carboplatin x 1 (01/29/2017) due to ARF and XRT.  XRT was followed by T & O on 02/01/2017 and T & N 02/10/2017 and 02/20/2017.  Course was complicated by 80 pound weight loss, nausea, vomiting, electrolyte wasting (potassium and magnesium).  She describes that.  Is been sick constantly requiring at least 20 hospitalizations.  Follow-up CT chest and PET on 06/2017. Per patient, 'radiation worked' and no disease in the abdomen.  At that time she was noted to have lung nodules that were growing and follow-up imaging was scheduled for 10/2017.  She was admitted to hospital in [place] for small bowel obstruction which was managed conservatively and she was home for a week prior to traveling to [place] for Thanksgiving holiday where she has family.  She presented to ER in [place] on 08/2017 with nausea, vomiting, and lower abdominal pain.  Symptoms did not respond to conservative treatment.   CT on 08/26/2017 revealed small bowel obstruction with transition in the pelvis just superior to the uterus rather was a long segment of distal ileum with fatty wall thickening compatible with chronic inflammation and/or radiation enteritis. Imaging showed numerous pulmonary nodules consistent with metastatic disease. She underwent laparotomy and right ileocolectomy on 08/31/2017 at [facility].  Surgical findings revealed a thickened, matted, and scarred piece of distal small bowel close to the ileocecal valve.  She was discharged on 09/05/2017.  Pain markedly increased in intensity and imaging was performed on 09/11/2017 which revealed: Debris within the anterior abdominal wall incision concerning for infection versus packing material, s/p  post ileo-colectomy with  expected postoperative changes, mild colonic ileus, numerous pulmonary nodules highly concerning for metastatic disease, punctate nonobstructing nephrolithiasis.  Staples were removed and one was packed.  She was started on doxycycline.  Abdominal and pelvic CT without contrast on 09/11/2017 revealed debris within anterior abdominal wall incision concerning for infection, versus packing material.   She is s/p ileocolectomy with expected postoperative changes and mild colonic ileus.  There were numerous pulmonary nodules highly concerning for metastatic disease and punctate nonobstructing nephrolithiasis. She was readmitted on 09/12/2017.  She describes the onset of lower abdominal pain on 09/09/2017.  Pain markedly increased in intensity on 09/11/2017.   Staples were removed and the wound packed.  She was started on doxycycline.  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   PET scan was ordered to evaluate enlarging lung nodules with concern for recurrent cervical cancer but scan was delayed due to insurance and need to be performed in [place].  Presented to ER on 12/03/2017 for abdominal pain and emesis.  Imaging concerning for worsening possible uterine infarct and she was admitted to hospital.  Pelvic MRI was unremarkable.  Remote scarring type changes of uterus thought to be possibly related to radiation.  She was discharged on 12/08/2017.  Underwent endoscopy and colonoscopy on 12/20/2017.    On 02/22/2018 she underwent laser ablation of condylomata around the anus and vagina under anesthesia with Dr. Johney Maine.    04/15/2018:  Chest, abdomen, and pelvis CT revealed innumerable (> 100) cavitary nodules scattered in the lungs, moderately enlarging compared to the 11/08/2017 PET-CT, suspicious for metastatic disease.  One index node in the RLL measures 1.0 x 1.1 cm (previously 0.6 x 0.6 cm).  There were no new nodules.  There was an ill-defined wall thickening in the rectosigmoid with surrounding stranding along fascia planes, probably sequela from prior radiation therapy.  There was multilevel lumbar impingement due to spondylosis and degenerative disc disease.  There was heterogeneous enhancement in the uterus (some possibly from prior radiation therapy).   04/23/2018:  PET scan revealed numerous scattered solid and cavitary nodules in the lungs stable increased in size compared to the prior PET-CT from 11/08/2017. Largest nodule was 1.1 cm in the LUL (SUV 1.9).  These demonstrated low-grade metabolic activity up to a maximum SUV of about 2.3, increased from 11/08/2017.    Case was discussed at tumor board on 04/25/2018. Consensus to pursue CT-guided biopsy (05/06/18) which revealed: Metastatic adenocarcinoma, morphologically consistent with cervical adenocarcinoma.  She has history of chronic hepatitis C which is managed by GI.  Hepatitis C genotype is 2a/2c.  She receives B12 injections for history of B12 deficiency.  On 04/24/2018 she underwent left total knee replacement with Dr. [name]. --------------------------------------------------------   DIAGNOSIS: Cervical adenocarcinoma  STAGE: IV        ;GOALS: Palliative  CURRENT/MOST RECENT THERAPY : Alimta [C]    Malignant neoplasm of overlapping sites of cervix ([place])  01/29/2019 - 04/23/2019 Chemotherapy   The patient had PACLitaxel (TAXOL) 156 mg in sodium chloride 0.9 % 250 mL chemo infusion (</= 44m/m2), 80 mg/m2 = 156 mg, Intravenous,  Once, 3 of 4 cycles Dose modification:  65 mg/m2 (original dose 80 mg/m2, Cycle 1, Reason: Provider  Judgment) Administration: 156 mg (01/29/2019), 156 mg (02/05/2019), 126 mg (02/12/2019), 126 mg (02/26/2019), 126 mg (03/13/2019), 126 mg (03/27/2019), 126 mg (03/20/2019), 126 mg (04/03/2019), 126 mg (04/10/2019)  for chemotherapy treatment.    05/08/2019 - 07/30/2019 Chemotherapy   The patient had pembrolizumab (KEYTRUDA) 200 mg in sodium chloride 0.9 % 50 mL chemo infusion, 200 mg, Intravenous, Once, 4 of 6 cycles Administration: 200 mg (05/08/2019), 200 mg (05/29/2019), 200 mg (06/19/2019), 200 mg (07/10/2019)  for chemotherapy treatment.    08/07/2019 -  Chemotherapy   The patient had pegfilgrastim-cbqv [facility]) injection 6 mg, 6 mg, Subcutaneous, Once, 3 of 5 cycles Administration: 6 mg (08/29/2019), 6 mg (09/19/2019) PEMEtrexed (ALIMTA) 1,000 mg in sodium chloride 0.9 % 100 mL chemo infusion, 950 mg, Intravenous,  Once, 4 of 6 cycles Administration: 1,000 mg (08/07/2019), 1,000 mg (09/18/2019), 1,000 mg (11/13/2019), 1,000 mg (08/28/2019)  for chemotherapy treatment.     Interval history-Joanna Hall is a 54 year old female with multiple medical problems including stage IV cervical cancer with metastatic disease to lungs.  She is status post multiple lines of chemotherapy and radiation; currently on observation.  Past medical history also notable for history of right hemicolectomy with chronic inflammation changes to the colon.  She has had multiple recent hospitalizations and ER visits for nausea and abdominal pain.   She is followed by Dr. Yevette Edwards and Dr. Fransisca Connors for her known malignancy and last received immunotherapy Alimta on 11/13/19. Treatment held secondary to TKR with post-op complication of hematoma. Recently restarted Xarelto for history of PE DVT factor V Leiden.   Had her IVC filter removed on Monday by Dr. Lucky Cowboy. No apparent complications.  She is followed by Altha Harm, NP for pain management and palliative care services.  She is taking oxycodone/OxyContin for abdominal/pelvic pain.  Stable at last visit (02/18/2020).  Today, Joanna Hall states she feels weak and believes her electrolytes are low.  She states that last week she has had on and off nausea and vomiting.  Her appetite has decreased and she is only able to eat vegetables and soups.  She continues to lose weight.  She has diarrhea rotating with constipation.  She has stopped taking all of her diarrhea medications at this time. She has abdominal pain that is intermittent and diffuse. She is using her pain medicine prn.  She states she is not proceeding with any additional treatment.  She is currently using palliative care services and will use hospice when needed.  She denies any recent fevers or illness, easy bleeding or bruising.  Denies chest pain or shortness of breath.    ECOG FS:2 - Symptomatic, <50% confined to bed  Review of systems- Review of Systems  Constitutional: Positive for malaise/fatigue. Negative for chills, fever and weight loss.  HENT: Negative for congestion, ear pain and tinnitus.   Eyes: Negative.  Negative for blurred vision and double vision.  Respiratory: Negative.  Negative for cough, sputum production and shortness of breath.   Cardiovascular: Negative.  Negative for chest pain, palpitations and leg swelling.  Gastrointestinal: Positive for abdominal pain, nausea and vomiting. Negative for constipation and diarrhea.  Genitourinary: Negative for dysuria, frequency and urgency.  Musculoskeletal: Negative for back pain and falls.  Skin: Negative.  Negative for rash.  Neurological: Positive for weakness. Negative for headaches.  Endo/Heme/Allergies: Negative.  Does not bruise/bleed easily.  Psychiatric/Behavioral: Negative.  Negative for depression. The patient is not nervous/anxious and does not  have insomnia.      Current treatment- Observation/Palliative care  Allergies  Allergen Reactions  . Ketamine Anxiety and Other (See Comments)    Syncope episode/confusion      Past  Medical History:  Diagnosis Date  . Abdominal pain 06/10/2018  . Abnormal cervical Papanicolaou smear 09/18/2017  . Anxiety   . Aortic atherosclerosis ([place])   . Arthritis    neck and knees  . 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 Blood transfusion without reported diagnosis   . Cervical cancer ([place]) 09/2016   mets lung  . Chronic anal fissure   . Chronic diarrhea   . Dyspnea   . Erosive gastropathy 09/18/2017  . Factor V Leiden mutation ([place])   . Fecal incontinence   . Genital warts   . GERD (gastroesophageal reflux disease)   . GI bleed 10/08/2018  . Heart murmur   . Hematochezia   . Hemorrhoids   . 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 Hepatitis, chronic ([place]) 05/05/2017  . History of cancer chemotherapy    completed 06/2017  . History of Clostridium difficile infection    while undergoing chemo.  Negative test 10/2017  . Ileocolic anastomotic leak   . 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 Intestinal infection due to Clostridium difficile 09/18/2017  . Lung cancer ([place])   . Macrocytic anemia with vitamin B12 deficiency   . Multiple gastric ulcers   . Nausea vomiting and diarrhea   . Pancolitis ([place]) 07/27/2018  . Perianal condylomata   . Pneumonia    History of  . Pulmonary nodules   . Rectal bleeding   . Small bowel obstruction ([place]) 08/2017  . Stiff neck    limited right turn  . Vitamin D deficiency      Past Surgical History:  Procedure Laterality Date  . CHOLECYSTECTOMY    . COLON SURGERY  08/2017   resection  . COLONOSCOPY WITH PROPOFOL N/A 12/20/2017   Procedure: COLONOSCOPY WITH PROPOFOL;  Surgeon: [name], MD;  Location: [facility] ENDOSCOPY;  Service: Gastroenterology;  Laterality: N/A;  . COLONOSCOPY WITH PROPOFOL N/A 07/30/2018   Procedure: COLONOSCOPY WITH PROPOFOL;  Surgeon: [name], MD;  Location: [facility] ENDOSCOPY;  Service: Gastroenterology;  Laterality: N/A;  .  COLONOSCOPY WITH PROPOFOL N/A 10/10/2018   Procedure: COLONOSCOPY WITH PROPOFOL;  Surgeon: Lucilla Lame, MD;  Location: [facility] ENDOSCOPY;  Service: Endoscopy;  Laterality: N/A;  . DIAGNOSTIC LAPAROSCOPY    . ESOPHAGOGASTRODUODENOSCOPY (EGD) WITH PROPOFOL N/A 12/20/2017   Procedure: ESOPHAGOGASTRODUODENOSCOPY (EGD) WITH PROPOFOL;  Surgeon: [name], MD;  Location: [place];  Service: Gastroenterology;  Laterality: N/A;  . ESOPHAGOGASTRODUODENOSCOPY (EGD) WITH PROPOFOL  07/30/2018   Procedure: ESOPHAGOGASTRODUODENOSCOPY (EGD) WITH PROPOFOL;  Surgeon: [name], MD;  Location: [facility] ENDOSCOPY;  Service: Gastroenterology;;  . Otho Darner SIGMOIDOSCOPY N/A 11/21/2018   Procedure: FLEXIBLE SIGMOIDOSCOPY;  Surgeon: [name], MD;  Location[facility] ENDOSCOPY;  Service: Gastroenterology;  Laterality: N/A;  . IVC FILTER INSERTION N/A 01/01/2020   Procedure: IVC FILTER INSERTION;  Surgeon: Algernon Huxley, MD;  Location: [place] CV LAB;  Service: Cardiovascular;  Laterality: N/A;  . IVC FILTER REMOVAL N/A 02/23/2020   Procedure: IVC FILTER REMOVAL;  Surgeon: Algernon Huxley, MD;  Location: [place] CV LAB;  Service: Cardiovascular;  Laterality: N/A;  . LAPAROTOMY N/A 08/31/2017   Procedure: EXPLORATORY LAPAROTOMY for  SBO, ileocolectomy, removal of piece of uterine wall;  Surgeon: [name], MD;  Location: [facility] ORS;  Service: General;  Laterality: N/A;  . LASER ABLATION CONDOLAMATA N/A 02/22/2018   Procedure: LASER ABLATION/REMOVAL OF [id number] AROUND ANUS AND VAGINA;  Surgeon: Michael Boston, MD;  Location: [place];  Service: General;  Laterality: N/A;  . OOPHORECTOMY    . PORTA CATH INSERTION N/A 05/13/2018   Procedure: PORTA CATH INSERTION;  Surgeon: Katha Cabal, MD;  Location: [place] CV LAB;  Service: Cardiovascular;  Laterality: N/A;  . SMALL INTESTINE SURGERY    . TANDEM RING INSERTION     x3  . THORACOTOMY    . TOTAL KNEE ARTHROPLASTY  Left 04/24/2018   Procedure: TOTAL KNEE ARTHROPLASTY;  Surgeon: Lovell Sheehan, MD;  Location: [facility] ORS;  Service: Orthopedics;  Laterality: Left;  . TOTAL KNEE ARTHROPLASTY Right 12/24/2019   Procedure: RIGHT TOTAL KNEE ARTHROPLASTY;  Surgeon: Lovell Sheehan, MD;  Location: [facility] ORS;  Service: Orthopedics;  Laterality: Right;    Social History   Socioeconomic History  . Marital status: Divorced    Spouse name: Not on file  . Number of children: Not on file  . Years of education: Not on file  . Highest education level: Not on file  Occupational History  . Not on file  Tobacco Use  . Smoking status: Former Smoker    Packs/day: 0.25    Years: 10.00    Pack years: 2.50    Types: Cigarettes    Quit date: 10/16/2006    Years since quitting: 13.3  . Smokeless tobacco: Never Used  Substance and Sexual Activity  . Alcohol use: Not Currently    Comment: seldom  . Drug use: Yes    Types: Marijuana    Comment: not very often  . Sexual activity: Not Currently    Birth control/protection: Post-menopausal    Comment: Not Asked  Other Topics Concern  . Not on file  Social History Narrative  . Not on file   Social Determinants of Health   Financial Resource Strain:   . Difficulty of Paying Living Expenses:   Food Insecurity:   . Worried About Charity fundraiser in the Last Year:   . Arboriculturist in the Last Year:   Transportation Needs:   . Film/video editor (Medical):   Marland Kitchen Lack of Transportation (Non-Medical):   Physical Activity:   . Days of Exercise per Week:   . Minutes of Exercise per Session:   Stress:   . Feeling of Stress :   Social Connections:   . Frequency of Communication with Friends and Family:   . Frequency of Social Gatherings with Friends and Family:   . Attends Religious Services:   . Active Member of Clubs or Organizations:   . Attends Archivist Meetings:   Marland Kitchen Marital Status:   Intimate Partner Violence:   . Fear of Current or  Ex-Partner:   . Emotionally Abused:   Marland Kitchen Physically Abused:   . Sexually Abused:     Family History  Problem Relation Age of Onset  . Hypertension Mother   . Hypertension Father   . Diabetes Father   . Hyperlipidemia Father   . Alcohol abuse Daughter   . Hypertension Maternal Grandmother   . Diabetes Maternal Grandmother   . Diabetes Paternal Grandmother   . Hypertension Paternal Grandmother      Current Outpatient Medications:  .  amitriptyline (ELAVIL) 100  MG tablet, Take 1 tablet (100 mg total) by mouth at bedtime., Disp: 90 tablet, Rfl: 1 .  diphenoxylate-atropine (LOMOTIL) 2.5-0.025 MG tablet, TAKE (2) TABLETS FOUR TIMES DAILY. (Patient not taking: Reported on 02/23/2020), Disp: 240 tablet, Rfl: 0 .  docusate sodium (COLACE) 100 MG capsule, Take 1 capsule (100 mg total) by mouth 2 (two) times daily., Disp: 10 capsule, Rfl: 0 .  famotidine (PEPCID) 20 MG tablet, Take 1 tablet (20 mg total) by mouth 2 (two) times daily. (Patient taking differently: Take 20 mg by mouth daily as needed for heartburn. ), Disp: 60 tablet, Rfl: 0 .  folic acid (FOLVITE) 1 MG tablet, Take 1 tablet (1 mg total) by mouth daily., Disp: 30 tablet, Rfl: 6 .  hydrOXYzine (VISTARIL) 25 MG capsule, Take 1 capsule (25 mg total) by mouth 3 (three) times daily as needed for itching., Disp: 30 capsule, Rfl: 0 .  hyoscyamine (LEVBID) 0.375 MG 12 hr tablet, Take 1 tablet (0.375 mg total) by mouth 2 (two) times daily., Disp: 60 tablet, Rfl: 0 .  methocarbamol (ROBAXIN) 500 MG tablet, Take 1 tablet (500 mg total) by mouth every 6 (six) hours as needed for muscle spasms. (Patient not taking: Reported on 02/23/2020), Disp: 40 tablet, Rfl: 2 .  Multiple Vitamins-Minerals (SUPER THERA VITE M) TABS, Take 1 tablet by mouth daily. , Disp: , Rfl:  .  ondansetron (ZOFRAN ODT) 4 MG disintegrating tablet, Take 1 tablet (4 mg total) by mouth every 6 (six) hours as needed for nausea or vomiting., Disp: 20 tablet, Rfl: 0 .  oxyCODONE  (ROXICODONE) 15 MG immediate release tablet, Take 1-1.5 tablets (15-22.5 mg total) by mouth every 3 (three) hours as needed (for breakthrough pain)., Disp: 90 tablet, Rfl: 0 .  oxyCODONE 30 MG 12 hr tablet, Take 1 tablet (30 mg total) by mouth every 8 (eight) hours., Disp: 90 tablet, Rfl: 0 .  pantoprazole (PROTONIX) 40 MG tablet, Take 1 tablet (40 mg total) by mouth daily., Disp: 90 tablet, Rfl: 3 .  promethazine (PHENERGAN) 25 MG tablet, Take 1 tablet (25 mg total) by mouth every 4 (four) hours as needed for nausea or vomiting., Disp: 30 tablet, Rfl: 1 .  rivaroxaban (XARELTO) 20 MG TABS tablet, Take 1 tablet (20 mg total) by mouth daily with supper., Disp: 60 tablet, Rfl: 1 .  sertraline (ZOLOFT) 25 MG tablet, Take 1 tablet (25 mg total) by mouth daily. (Patient not taking: Reported on 02/02/2020), Disp: 30 tablet, Rfl: 0 .  triamcinolone ointment (KENALOG) 0.5 %, Apply 1 application topically 2 (two) times daily. (Patient not taking: Reported on 02/23/2020), Disp: 30 g, Rfl: 0 .  VENTOLIN HFA 108 (90 Base) MCG/ACT inhaler, Inhale 1-2 puffs into the lungs every 4 (four) hours as needed for shortness of breath., Disp: 1 Inhaler, Rfl: 1 No current facility-administered medications for this visit.  Facility-Administered Medications Ordered in Other Visits:  .  heparin lock flush 100 unit/mL, 500 Units, Intravenous, Once, Corcoran, Melissa C, MD .  heparin lock flush 100 unit/mL, 500 Units, Intracatheter, Once, Guillaume Weninger E, NP .  magnesium sulfate IVPB 4 g 100 mL, 4 g, Intravenous, Once, Faythe Casa E, NP, Last Rate: 50 mL/hr at 02/25/20 1050, 4 g at 02/25/20 1050 .  sodium chloride 0.9 % 1,000 mL with potassium chloride 20 mEq infusion, , Intravenous, Once, Honor Loh E, NP .  sodium chloride flush (NS) 0.9 % injection 10 mL, 10 mL, Intravenous, Once, Borders, Kirt Boys, NP  Physical exam:  Vitals:   02/25/20 1019  BP: 108/76  Pulse: (!) 111  Temp: (!) 96.9 F (36.1 C)  TempSrc:  Tympanic  Weight: 144 lb (65.3 kg)   Physical Exam Constitutional:      Appearance: Normal appearance.  HENT:     Head: Normocephalic and atraumatic.  Eyes:     Pupils: Pupils are equal, round, and reactive to light.  Cardiovascular:     Rate and Rhythm: Normal rate and regular rhythm.     Heart sounds: Normal heart sounds. No murmur.  Pulmonary:     Effort: Pulmonary effort is normal.     Breath sounds: Normal breath sounds. No wheezing.  Abdominal:     General: Bowel sounds are normal. There is no distension.     Palpations: Abdomen is soft.     Tenderness: There is no abdominal tenderness.  Musculoskeletal:        General: Tenderness present. Normal range of motion.     Cervical back: Normal range of motion.  Skin:    General: Skin is warm and dry.     Coloration: Skin is pale.     Findings: No rash.  Neurological:     Mental Status: She is alert and oriented to person, place, and time.  Psychiatric:        Judgment: Judgment normal.      CMP Latest Ref Rng & Units 02/25/2020  Glucose 70 - 99 mg/dL 89  BUN 6 - 20 mg/dL 9  Creatinine 0.44 - 1.00 mg/dL 0.75  Sodium 135 - 145 mmol/L 137  Potassium 3.5 - 5.1 mmol/L 4.0  Chloride 98 - 111 mmol/L 103  CO2 22 - 32 mmol/L 26  Calcium 8.9 - 10.3 mg/dL 8.9  Total Protein 6.5 - 8.1 g/dL 7.0  Total Bilirubin 0.3 - 1.2 mg/dL 0.6  Alkaline Phos 38 - 126 U/L 203(H)  AST 15 - 41 U/L 46(H)  ALT 0 - 44 U/L 36   CBC Latest Ref Rng & Units 02/25/2020  WBC 4.0 - 10.5 K/uL 2.9(L)  Hemoglobin 12.0 - 15.0 g/dL 11.7(L)  Hematocrit 36.0 - 46.0 % 35.2(L)  Platelets 150 - 400 K/uL 177    No images are attached to the encounter.  PERIPHERAL VASCULAR CATHETERIZATION  Result Date: 02/23/2020 See op note  VAS [place] LOWER EXTREMITY VENOUS (DVT)  Result Date: 02/17/2020  Lower Venous DVTStudy Other Indications: Hx PE. Risk Factors: Surgery IVC filter placed 01/01/2020. Performing Technologist: Concha Norway RVT  Examination Guidelines: A  complete evaluation includes B-mode imaging, spectral Doppler, color Doppler, and power Doppler as needed of all accessible portions of each vessel. Bilateral testing is considered an integral part of a complete examination. Limited examinations for reoccurring indications may be performed as noted. The reflux portion of the exam is performed with the patient in reverse Trendelenburg.  +---------+---------------+---------+-----------+----------+--------------+ RIGHT    CompressibilityPhasicitySpontaneityPropertiesThrombus Aging +---------+---------------+---------+-----------+----------+--------------+ CFV      Full           Yes      Yes                                 +---------+---------------+---------+-----------+----------+--------------+ SFJ      Full           Yes      Yes                                 +---------+---------------+---------+-----------+----------+--------------+  FV Prox  Full           Yes      Yes                                 +---------+---------------+---------+-----------+----------+--------------+ FV Mid   Full           Yes      Yes                                 +---------+---------------+---------+-----------+----------+--------------+ FV DistalFull           Yes      Yes                                 +---------+---------------+---------+-----------+----------+--------------+ PFV      Full           Yes      Yes                                 +---------+---------------+---------+-----------+----------+--------------+ POP      Full           Yes      Yes                                 +---------+---------------+---------+-----------+----------+--------------+ PTV      Full           Yes      Yes                                 +---------+---------------+---------+-----------+----------+--------------+ GSV      Full           Yes      Yes                                  +---------+---------------+---------+-----------+----------+--------------+ SSV      Full           Yes      Yes                                 +---------+---------------+---------+-----------+----------+--------------+   +---------+---------------+---------+-----------+----------+--------------+ LEFT     CompressibilityPhasicitySpontaneityPropertiesThrombus Aging +---------+---------------+---------+-----------+----------+--------------+ CFV      Full           Yes      Yes                                 +---------+---------------+---------+-----------+----------+--------------+ SFJ      Full           Yes      Yes                                 +---------+---------------+---------+-----------+----------+--------------+ FV Prox  Full           Yes      Yes                                 +---------+---------------+---------+-----------+----------+--------------+  FV Mid   Full           Yes      Yes                                 +---------+---------------+---------+-----------+----------+--------------+ FV DistalFull           Yes      Yes                                 +---------+---------------+---------+-----------+----------+--------------+ PFV      Full           Yes      Yes                                 +---------+---------------+---------+-----------+----------+--------------+ POP      Full           Yes      Yes                                 +---------+---------------+---------+-----------+----------+--------------+ PTV      Full           Yes      Yes                                 +---------+---------------+---------+-----------+----------+--------------+ GSV      Full           Yes      Yes                                 +---------+---------------+---------+-----------+----------+--------------+ SSV      Full           Yes      Yes                                  +---------+---------------+---------+-----------+----------+--------------+     Summary: BILATERAL: - No evidence of deep vein thrombosis seen in the lower extremities, bilaterally. - No evidence of superficial venous thrombosis in the lower extremities, bilaterally.   *See table(s) above for measurements and observations. Electronically signed by Leotis Pain MD on 02/17/2020 at 10:08:31 AM.    Final      Assessment and plan- Patient is a 54 y.o. female who presents to symptom management for weakness and requesting labs to be checked and possible IV fluids.  Patient presents for weakness and abdominal pain.  Lab work showed low magnesium (1.4) and neutropenia (ANC 1.6.)  She has not had treatment in several months.  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  Unclear etiology of nausea and vomiting she experienced last week.  Seems to be improving this week.  We will give 1 L of normal saline along with 4 g magnesium while in clinic today.  She is also having diffuse abdominal pain-likely neoplasm related.  Will give a dose of morphine while in clinic.  She is to continue her narcotics as prescribed by Altha Harm, NP.  Abdominal pain is concerning.  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  Will get ultrasound of kidneys to evaluate further.  She may need referral to urology.  Plan: IV fluids  4 g magnesium 2 mg morphine. Stat Renal Ultrasound-ordered  Disposition: We will call patient with results RT [place] [facility] as needed. RTC on 03/02/2020 for lab work and MD assessment.    Visit Diagnosis No diagnosis found.  Patient expressed understanding and was in agreement with this plan. She also understands that She can call clinic at any time with any questions, concerns, or complaints.   Greater than 50% was spent in counseling and coordination of care with this patient including but not limited to discussion of the relevant topics  above (See A&P) including, but not limited to diagnosis and management of acute and chronic medical conditions.   Thank you for allowing me to participate in the care of this very pleasant patient.    Jacquelin Hawking, NP [facility] Cell - [phone] Pager- [phone] 02/25/2020 11:55 AM

## 2020-02-26 ENCOUNTER — Ambulatory Visit
Admission: RE | Admit: 2020-02-26 | Discharge: 2020-02-26 | Disposition: A | Discharge status: Home / Self Care | Payer: Medicaid Other | Source: Ambulatory Visit | Attending: Oncology | Admitting: Oncology

## 2020-02-26 ENCOUNTER — Other Ambulatory Visit: Payer: Self-pay

## 2020-02-26 DIAGNOSIS — C538 Malignant neoplasm of overlapping sites of cervix uteri: Secondary | ICD-10-CM | POA: Diagnosis present

## 2020-02-26 DIAGNOSIS — N1330 Unspecified hydronephrosis: Secondary | ICD-10-CM | POA: Diagnosis not present

## 2020-02-26 DIAGNOSIS — R109 Unspecified abdominal pain: Secondary | ICD-10-CM | POA: Insufficient documentation

## 2020-02-26 NOTE — Progress Notes (Signed)
Normal Renal ultrasound.   Faythe Casa, NP 02/26/2020 4:58 PM

## 2020-02-27 ENCOUNTER — Other Ambulatory Visit: Payer: Self-pay | Admitting: *Deleted

## 2020-02-27 ENCOUNTER — Encounter: Payer: Self-pay | Admitting: Oncology

## 2020-02-27 DIAGNOSIS — R1030 Lower abdominal pain, unspecified: Secondary | ICD-10-CM

## 2020-02-27 MED ORDER — OXYCODONE HCL 15 MG PO TABS: 15.0000 mg | ORAL_TABLET | ORAL | 0 refills | Status: DC | PRN

## 2020-02-27 MED ORDER — OXYCODONE HCL ER 30 MG PO T12A: 30.0000 mg | EXTENDED_RELEASE_TABLET | Freq: Three times a day (TID) | ORAL | 0 refills | Status: DC

## 2020-02-27 NOTE — Telephone Encounter (Signed)
Patient sent mychart msg to determine if it was "time" to renew her narcotics.

## 2020-02-29 ENCOUNTER — Emergency Department: Payer: Medicaid Other

## 2020-02-29 ENCOUNTER — Other Ambulatory Visit: Payer: Self-pay | Admitting: Internal Medicine

## 2020-02-29 ENCOUNTER — Other Ambulatory Visit: Payer: Self-pay

## 2020-02-29 ENCOUNTER — Encounter: Payer: Self-pay | Admitting: Emergency Medicine

## 2020-02-29 ENCOUNTER — Emergency Department
Admission: EM | Admit: 2020-02-29 | Discharge: 2020-02-29 | Disposition: A | Discharge status: Home / Self Care | Payer: Medicaid Other | Attending: Emergency Medicine | Admitting: Emergency Medicine

## 2020-02-29 DIAGNOSIS — Z96651 Presence of right artificial knee joint: Secondary | ICD-10-CM | POA: Insufficient documentation

## 2020-02-29 DIAGNOSIS — I10 Essential (primary) hypertension: Secondary | ICD-10-CM | POA: Diagnosis not present

## 2020-02-29 DIAGNOSIS — C539 Malignant neoplasm of cervix uteri, unspecified: Secondary | ICD-10-CM | POA: Diagnosis not present

## 2020-02-29 DIAGNOSIS — R1110 Vomiting, unspecified: Secondary | ICD-10-CM | POA: Diagnosis not present

## 2020-02-29 DIAGNOSIS — Z87891 Personal history of nicotine dependence: Secondary | ICD-10-CM | POA: Insufficient documentation

## 2020-02-29 DIAGNOSIS — R1084 Generalized abdominal pain: Secondary | ICD-10-CM

## 2020-02-29 DIAGNOSIS — C799 Secondary malignant neoplasm of unspecified site: Secondary | ICD-10-CM

## 2020-02-29 DIAGNOSIS — Z85118 Personal history of other malignant neoplasm of bronchus and lung: Secondary | ICD-10-CM | POA: Diagnosis not present

## 2020-02-29 DIAGNOSIS — Z9221 Personal history of antineoplastic chemotherapy: Secondary | ICD-10-CM | POA: Diagnosis not present

## 2020-02-29 DIAGNOSIS — Z79899 Other long term (current) drug therapy: Secondary | ICD-10-CM | POA: Diagnosis not present

## 2020-02-29 DIAGNOSIS — R197 Diarrhea, unspecified: Secondary | ICD-10-CM | POA: Insufficient documentation

## 2020-02-29 DIAGNOSIS — Z7901 Long term (current) use of anticoagulants: Secondary | ICD-10-CM | POA: Diagnosis not present

## 2020-02-29 DIAGNOSIS — F1210 Cannabis abuse, uncomplicated: Secondary | ICD-10-CM | POA: Insufficient documentation

## 2020-02-29 DIAGNOSIS — Z96652 Presence of left artificial knee joint: Secondary | ICD-10-CM | POA: Insufficient documentation

## 2020-02-29 DIAGNOSIS — R1030 Lower abdominal pain, unspecified: Secondary | ICD-10-CM

## 2020-02-29 LAB — MAGNESIUM: Magnesium: 1.3 mg/dL — ABNORMAL LOW (ref 1.7–2.4)

## 2020-02-29 LAB — CBC WITH DIFFERENTIAL/PLATELET
Abs Immature Granulocytes: 0.01 10*3/uL (ref 0.00–0.07)
Basophils Absolute: 0 10*3/uL (ref 0.0–0.1)
Basophils Relative: 0 %
Eosinophils Absolute: 0 10*3/uL (ref 0.0–0.5)
Eosinophils Relative: 0 %
HCT: 37.2 % (ref 36.0–46.0)
Hemoglobin: 12.6 g/dL (ref 12.0–15.0)
Immature Granulocytes: 0 %
Lymphocytes Relative: 19 %
Lymphs Abs: 0.7 10*3/uL (ref 0.7–4.0)
MCH: 30.4 pg (ref 26.0–34.0)
MCHC: 33.9 g/dL (ref 30.0–36.0)
MCV: 89.9 fL (ref 80.0–100.0)
Monocytes Absolute: 0.2 10*3/uL (ref 0.1–1.0)
Monocytes Relative: 6 %
Neutro Abs: 2.9 10*3/uL (ref 1.7–7.7)
Neutrophils Relative %: 75 %
Platelets: 188 10*3/uL (ref 150–400)
RBC: 4.14 MIL/uL (ref 3.87–5.11)
RDW: 14 % (ref 11.5–15.5)
WBC: 3.9 10*3/uL — ABNORMAL LOW (ref 4.0–10.5)
nRBC: 0 % (ref 0.0–0.2)

## 2020-02-29 LAB — URINALYSIS, COMPLETE (UACMP) WITH MICROSCOPIC
Bacteria, UA: NONE SEEN
Bilirubin Urine: NEGATIVE
Glucose, UA: NEGATIVE mg/dL
Hgb urine dipstick: NEGATIVE
Ketones, ur: 20 mg/dL — AB
Leukocytes,Ua: NEGATIVE
Nitrite: NEGATIVE
Protein, ur: NEGATIVE mg/dL
Specific Gravity, Urine: >1.046 — ABNORMAL HIGH (ref 1.005–1.030)
Squamous Epithelial / HPF: NONE SEEN (ref 0–5)
pH: 6 (ref 5.0–8.0)

## 2020-02-29 LAB — COMPREHENSIVE METABOLIC PANEL
ALT: 54 U/L — ABNORMAL HIGH (ref 0–44)
AST: 69 U/L — ABNORMAL HIGH (ref 15–41)
Albumin: 3.6 g/dL (ref 3.5–5.0)
Alkaline Phosphatase: 202 U/L — ABNORMAL HIGH (ref 38–126)
Anion gap: 11 (ref 5–15)
BUN: 12 mg/dL (ref 6–20)
CO2: 24 mmol/L (ref 22–32)
Calcium: 9.2 mg/dL (ref 8.9–10.3)
Chloride: 102 mmol/L (ref 98–111)
Creatinine, Ser: 0.62 mg/dL (ref 0.44–1.00)
GFR calc Af Amer: >60 mL/min (ref >60)
GFR calc non Af Amer: >60 mL/min (ref >60)
Glucose, Bld: 146 mg/dL — ABNORMAL HIGH (ref 70–99)
Potassium: 3.6 mmol/L (ref 3.5–5.1)
Sodium: 137 mmol/L (ref 135–145)
Total Bilirubin: 0.9 mg/dL (ref 0.3–1.2)
Total Protein: 7.4 g/dL (ref 6.5–8.1)

## 2020-02-29 LAB — LIPASE, BLOOD: Lipase: 21 U/L (ref 11–51)

## 2020-02-29 MED ORDER — SODIUM CHLORIDE 0.9 % IV BOLUS
1000.0000 mL | Freq: Once | INTRAVENOUS | Status: AC
  Administered 2020-02-29: 1000 mL via INTRAVENOUS

## 2020-02-29 MED ORDER — ONDANSETRON HCL 4 MG/2ML IJ SOLN
4.0000 mg | Freq: Once | INTRAMUSCULAR | Status: AC
  Administered 2020-02-29: 4 mg via INTRAVENOUS
  Filled 2020-02-29: qty 2

## 2020-02-29 MED ORDER — OXYCODONE HCL ER 30 MG PO T12A: 30.0000 mg | EXTENDED_RELEASE_TABLET | Freq: Three times a day (TID) | ORAL | 0 refills | Status: AC

## 2020-02-29 MED ORDER — HYDROMORPHONE HCL 1 MG/ML IJ SOLN
1.0000 mg | INTRAMUSCULAR | Status: AC | PRN
  Administered 2020-02-29 (×2): 1 mg via INTRAVENOUS
  Filled 2020-02-29 (×2): qty 1

## 2020-02-29 MED ORDER — MAGNESIUM SULFATE 2 GM/50ML IV SOLN
2.0000 g | Freq: Once | INTRAVENOUS | Status: AC
  Administered 2020-02-29: 2 g via INTRAVENOUS
  Filled 2020-02-29: qty 50

## 2020-02-29 MED ORDER — HEPARIN SOD (PORK) LOCK FLUSH 100 UNIT/ML IV SOLN
500.0000 [IU] | Freq: Once | INTRAVENOUS | Status: AC | PRN
  Administered 2020-02-29: 500 [IU] via INTRAVENOUS
  Filled 2020-02-29: qty 5

## 2020-02-29 MED ORDER — OXYCODONE HCL ER 10 MG PO T12A
30.0000 mg | EXTENDED_RELEASE_TABLET | Freq: Once | ORAL | Status: AC
  Administered 2020-02-29: 30 mg via ORAL
  Filled 2020-02-29: qty 3

## 2020-02-29 MED ORDER — OXYCODONE HCL 15 MG PO TABS: 15.0000 mg | ORAL_TABLET | ORAL | 0 refills | Status: AC | PRN

## 2020-02-29 MED ORDER — HYDROMORPHONE HCL 1 MG/ML IJ SOLN
1.0000 mg | Freq: Once | INTRAMUSCULAR | Status: AC
  Administered 2020-02-29: 1 mg via INTRAVENOUS
  Filled 2020-02-29: qty 1

## 2020-02-29 MED ORDER — IOHEXOL 300 MG/ML  SOLN
100.0000 mL | Freq: Once | INTRAMUSCULAR | Status: AC | PRN
  Administered 2020-02-29: 100 mL via INTRAVENOUS

## 2020-02-29 MED ORDER — OXYCODONE HCL ER 30 MG PO T12A: 30.0000 mg | EXTENDED_RELEASE_TABLET | Freq: Three times a day (TID) | ORAL | Status: DC
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
## 2020-02-29 NOTE — ED Notes (Signed)
Pt PO challenged
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
## 2020-02-29 NOTE — ED Notes (Signed)
Pt has not had a BM since in the ED

## 2020-02-29 NOTE — Discharge Instructions (Addendum)
I suspect this is most likely your chronic abdominal pain or withdraw from missing your pain meds. We have given you 1 dose of your long-acting oxycodone she do not need to start that for another 8 hours from now.  I have written you prescription for enough pills for 1 day to cover you until tomorrow as well as some short acting oxycodone to help for your breakthrough pain.  Please call your palliative doctor tomorrow to address needing additional prescriptions.  Return to the ER if you develop vomiting, worsening pain or any other concerns     IMPRESSION: 1. No acute abdominopelvic findings. 2. Previously seen left-sided hydronephrosis has resolved. 3. Numerous nodules again identified within the bilateral lung bases, many of which are cavitary. No appreciable interval change from previous CT. 4. Previously described enhancing soft tissue in the region of the distal left ureter is not well seen on the current study given the lack of distention of the left ureter. There may be mild urinary bladder wall thickening, slightly more pronounced on the left, similar to prior. 5. Chronic bilateral sacral insufficiency fractures, unchanged. Patchy sclerosis in the parasymphyseal left pubic bone, unchanged.   Aortic Atherosclerosis (ICD10-I70.0).

## 2020-02-29 NOTE — ED Notes (Signed)
Port flushed with heparin and de-accessed. 

## 2020-02-29 NOTE — ED Provider Notes (Signed)
[facility] Emergency Department Provider Note  ____________________________________________   First MD Initiated Contact with Patient 02/29/20 [phone]     (approximate)  I have reviewed the triage vital signs and the nursing notes.   HISTORY  Chief Complaint Abdominal Pain, Emesis, and Diarrhea    HPI Joanna Hall is a 54 y.o. 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  Patient stated this started last night.  Been associate with some vomiting diarrhea.  Patient took 8 mg of p.o. Zofran 3 hours ago with no improvement.  Patient reports that she ran out of pain medications and next refills until the 18th.  Patient states that they were stolen by her physical therapist.  Patient states that her abdominal pain started last night, severe, constant, nothing makes better, nothing makes it worse.  She is requesting some pain medicine.  Patient reports some nonbloody nonbilious vomiting associated with it as well as some runny diarrhea.  She denies any chest pain, shortness of breath.   Past Medical History:  Diagnosis Date  . Abdominal pain 06/10/2018  . Abnormal cervical Papanicolaou smear 09/18/2017  . Anxiety   . Aortic atherosclerosis ([place])   . Arthritis    neck and knees  . 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 Blood transfusion without reported diagnosis   . Cervical cancer ([place]) 09/2016   mets lung  . Chronic anal fissure   . Chronic diarrhea   . Dyspnea   . Erosive gastropathy 09/18/2017  . Factor V Leiden mutation ([place])   . Fecal incontinence   . Genital warts   . GERD (gastroesophageal reflux disease)   . GI bleed 10/08/2018  . Heart murmur   . Hematochezia   . Hemorrhoids   . 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 Hepatitis, chronic ([place]) 05/05/2017  . History of cancer chemotherapy    completed 06/2017   . History of Clostridium difficile infection    while undergoing chemo.  Negative test 10/2017  . Ileocolic anastomotic leak   . 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 Intestinal infection due to Clostridium difficile 09/18/2017  . Lung cancer ([place])   . Macrocytic anemia with vitamin B12 deficiency   . Multiple gastric ulcers   . Nausea vomiting and diarrhea   . Pancolitis ([place]) 07/27/2018  . Perianal condylomata   . Pneumonia    History of  . Pulmonary nodules   . Rectal bleeding   . Small bowel obstruction ([place]) 08/2017  . Stiff neck    limited right turn  . Vitamin D deficiency     Patient Active Problem List   Diagnosis Date Noted  . S/P IVC filter 02/13/2020  . Hydroureter 12/29/2019  . Transaminitis   . Right leg pain 12/28/2019  . History of total knee arthroplasty, right 12/24/2019  . Moderate episode of recurrent major depressive disorder ([place]) 11/13/2019  . Acute colitis 09/25/2019  . Anxiety and depression   . Thrombocytopenia ([place]) 08/17/2019  . Intractable lower abdominal pain   . Abdominal pain 07/23/2019  . Constipation 07/22/2019  . Malnutrition of moderate degree 06/22/2019  . Small bowel obstruction due to adhesions ([place]) 06/21/2019  . Acute gastroenteritis 06/14/2019  . Intractable nausea and vomiting 06/07/2019  . Chest pain 05/19/2019  . Intractable pain 05/19/2019  .  Cancer associated pain   . Proctitis, radiation   . Palliative care encounter   . Encounter for monitoring opioid maintenance therapy 11/19/2018  . Adrenal insufficiency ([place]) 09/03/2018  . Nausea vomiting and diarrhea   . Chronic diarrhea 06/25/2018  . Chronic anticoagulation 06/25/2018  . Vomiting   . Encounter for antineoplastic chemotherapy 06/04/2018  . 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 Lung metastasis ([place]) 05/15/2018  . Closed fracture of distal end of radius 05/04/2018  . History of total knee arthroplasty 04/24/2018  . Osteoarthritis of left  knee 02/28/2018  . Condyloma acuminatum of anus s/p ablation 02/22/2018 02/22/2018  . Condyloma acuminatum of vagina s/p ablation 02/22/2018 02/22/2018  . Positive ANA (antinuclear antibody) 02/04/2018  . Aortic atherosclerosis ([place]) 01/15/2018  . Elevated MCV 01/15/2018  . Anemia 01/15/2018  . Genital warts 01/15/2018  . Vitamin D deficiency 01/15/2018  . Pernicious anemia   . Impingement syndrome of shoulder region 12/19/2017  . Multiple joint pain 12/19/2017  . Osteoarthritis of right knee 12/19/2017  . B12 deficiency 12/11/2017  . Lung nodules 12/11/2017  . History of Clostridium difficile infection 10/23/2017  . Factor V Leiden ([place]) 10/19/2017  . Goals of care, counseling/discussion 10/19/2017  . Cervical cancer, FIGO stage IB1 ([place]) 09/18/2017  . Chronic hepatitis C without hepatic coma ([place]) 09/18/2017  . Cytopenia 09/18/2017  . Diarrhea 09/18/2017  . Elevated LFTs 09/18/2017  . Lower abdominal pain 09/18/2017  . 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 Malignant neoplasm of overlapping sites of cervix ([place]) 09/18/2017  . Renal insufficiency 09/18/2017  . Wound infection after surgery 09/12/2017  . Hypokalemia   . Hypomagnesemia   . Cervical arthritis 07/18/2017  . Dysuria 06/20/2017  . Metastasis from cervical cancer ([place]) 05/12/2017  . Essential hypertension 03/15/2017  . Anemia of chronic disease 03/01/2017  . Chemotherapy-induced neutropenia ([place]) 01/29/2017  . 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 CHOLECYSTECTOMY    . COLON SURGERY  08/2017   resection  . COLONOSCOPY WITH PROPOFOL N/A 12/20/2017   Procedure: COLONOSCOPY WITH PROPOFOL;  Surgeon: [name], MD;  Location: [facility] ENDOSCOPY;  Service: Gastroenterology;  Laterality: N/A;  . COLONOSCOPY WITH PROPOFOL N/A 07/30/2018   Procedure: COLONOSCOPY WITH PROPOFOL;  Surgeon: [name], MD;  Location: [facility] ENDOSCOPY;  Service:  Gastroenterology;  Laterality: N/A;  . COLONOSCOPY WITH PROPOFOL N/A 10/10/2018   Procedure: COLONOSCOPY WITH PROPOFOL;  Surgeon: Lucilla Lame, MD;  Location: [facility] ENDOSCOPY;  Service: Endoscopy;  Laterality: N/A;  . DIAGNOSTIC LAPAROSCOPY    . ESOPHAGOGASTRODUODENOSCOPY (EGD) WITH PROPOFOL N/A 12/20/2017   Procedure: ESOPHAGOGASTRODUODENOSCOPY (EGD) WITH PROPOFOL;  Surgeon: [name], MD;  Location: [place];  Service: Gastroenterology;  Laterality: N/A;  . ESOPHAGOGASTRODUODENOSCOPY (EGD) WITH PROPOFOL  07/30/2018   Procedure: ESOPHAGOGASTRODUODENOSCOPY (EGD) WITH PROPOFOL;  Surgeon: [name], MD;  Location: [facility] ENDOSCOPY;  Service: Gastroenterology;;  . Otho Darner SIGMOIDOSCOPY N/A 11/21/2018   Procedure: FLEXIBLE SIGMOIDOSCOPY;  Surgeon: [name], MD;  Location: [facility] ENDOSCOPY;  Service: Gastroenterology;  Laterality: N/A;  . IVC FILTER INSERTION N/A 01/01/2020   Procedure: IVC FILTER INSERTION;  Surgeon: Algernon Huxley, MD;  Location: [place] CV LAB;  Service: Cardiovascular;  Laterality: N/A;  . IVC FILTER REMOVAL N/A 02/23/2020   Procedure: IVC FILTER REMOVAL;  Surgeon: Algernon Huxley, MD;  Location: [place] CV LAB;  Service: Cardiovascular;  Laterality: N/A;  . LAPAROTOMY N/A 08/31/2017   Procedure: EXPLORATORY LAPAROTOMY for  SBO, ileocolectomy, removal of piece of uterine wall;  Surgeon: [name], MD;  Location: [facility] ORS;  Service: General;  Laterality: N/A;  . LASER ABLATION CONDOLAMATA N/A 02/22/2018   Procedure: LASER ABLATION/REMOVAL OF [id number] AROUND ANUS AND VAGINA;  Surgeon: Michael Boston, MD;  Location: [place];  Service: General;  Laterality: N/A;  . OOPHORECTOMY    . PORTA CATH INSERTION N/A 05/13/2018   Procedure: PORTA CATH INSERTION;  Surgeon: Katha Cabal, MD;  Location: [place] CV LAB;  Service: Cardiovascular;  Laterality: N/A;  . SMALL INTESTINE SURGERY    . TANDEM RING INSERTION     x3  .  THORACOTOMY    . TOTAL KNEE ARTHROPLASTY Left 04/24/2018   Procedure: TOTAL KNEE ARTHROPLASTY;  Surgeon: Lovell Sheehan, MD;  Location: [facility] ORS;  Service: Orthopedics;  Laterality: Left;  . TOTAL KNEE ARTHROPLASTY Right 12/24/2019   Procedure: RIGHT TOTAL KNEE ARTHROPLASTY;  Surgeon: Lovell Sheehan, MD;  Location: [facility] ORS;  Service: Orthopedics;  Laterality: Right;    Prior to Admission medications   Medication Sig Start Date End Date Taking? Authorizing Provider  amitriptyline (ELAVIL) 100 MG tablet Take 1 tablet (100 mg total) by mouth at bedtime. 11/10/19 02/23/20  [name], MD  diphenoxylate-atropine (LOMOTIL) 2.5-0.025 MG tablet TAKE (2) TABLETS FOUR TIMES DAILY. Patient not taking: Reported on 02/23/2020 02/04/20   [name], MD  docusate sodium (COLACE) 100 MG capsule Take 1 capsule (100 mg total) by mouth 2 (two) times daily. 12/25/19   Carlynn Spry, PA-C  famotidine (PEPCID) 20 MG tablet Take 1 tablet (20 mg total) by mouth 2 (two) times daily. Patient taking differently: Take 20 mg by mouth daily as needed for heartburn.  10/18/19   Carrie Mew, MD  folic acid (FOLVITE) 1 MG tablet Take 1 tablet (1 mg total) by mouth daily. 07/31/19   Cammie Sickle, MD  hydrOXYzine (VISTARIL) 25 MG capsule Take 1 capsule (25 mg total) by mouth 3 (three) times daily as needed for itching. 01/22/20   Cammie Sickle, MD  hyoscyamine (LEVBID) 0.375 MG 12 hr tablet Take 1 tablet (0.375 mg total) by mouth 2 (two) times daily. 12/17/19   [name], MD  methocarbamol (ROBAXIN) 500 MG tablet Take 1 tablet (500 mg total) by mouth every 6 (six) hours as needed for muscle spasms. Patient not taking: Reported on 02/23/2020 12/25/19   Carlynn Spry, PA-C  Multiple Vitamins-Minerals (SUPER THERA VITE M) TABS Take 1 tablet by mouth daily.     [provider]  ondansetron (ZOFRAN ODT) 4 MG disintegrating tablet Take 1 tablet (4 mg total) by mouth every 6 (six) hours  as needed for nausea or vomiting. 01/14/20   Delman Kitten, MD  oxyCODONE (ROXICODONE) 15 MG immediate release tablet Take 1-1.5 tablets (15-22.5 mg total) by mouth every 3 (three) hours as needed (for breakthrough pain). 02/27/20   Borders, Kirt Boys, NP  oxyCODONE 30 MG 12 hr tablet Take 1 tablet (30 mg total) by mouth every 8 (eight) hours. 03/06/20   Borders, Kirt Boys, NP  pantoprazole (PROTONIX) 40 MG tablet Take 1 tablet (40 mg total) by mouth daily. 12/05/19   Cammie Sickle, MD  promethazine (PHENERGAN) 25 MG tablet Take 1 tablet (25 mg total) by mouth every 4 (four) hours as needed for nausea or vomiting. 02/02/20   Earleen Newport, MD  rivaroxaban (XARELTO) 20 MG TABS tablet Take 1 tablet (20 mg total) by mouth daily  with supper. 02/10/20   Cammie Sickle, MD  sertraline (ZOLOFT) 25 MG tablet Take 1 tablet (25 mg total) by mouth daily. Patient not taking: Reported on 02/02/2020 09/29/19   Loletha Grayer, MD  triamcinolone ointment (KENALOG) 0.5 % Apply 1 application topically 2 (two) times daily. Patient not taking: Reported on 02/23/2020 11/20/19   Jacquelin Hawking, NP  VENTOLIN HFA 108 (90 Base) MCG/ACT inhaler Inhale 1-2 puffs into the lungs every 4 (four) hours as needed for shortness of breath. 02/19/19   Verlon Au, NP    Allergies Ketamine  Family History  Problem Relation Age of Onset  . Hypertension Mother   . Hypertension Father   . Diabetes Father   . Hyperlipidemia Father   . Alcohol abuse Daughter   . Hypertension Maternal Grandmother   . Diabetes Maternal Grandmother   . Diabetes Paternal Grandmother   . Hypertension Paternal Grandmother     Social History Social History   Tobacco Use  . Smoking status: Former Smoker    Packs/day: 0.25    Years: 10.00    Pack years: 2.50    Types: Cigarettes    Quit date: 10/16/2006    Years since quitting: 13.3  . Smokeless tobacco: Never Used  Substance Use Topics  . Alcohol use: Not Currently    Comment:  seldom  . Drug use: Yes    Types: Marijuana    Comment: not very often      Review of Systems Constitutional: No fever/chills Eyes: No visual changes. ENT: No sore throat. Cardiovascular: Denies chest pain. Respiratory: Denies shortness of breath. Gastrointestinal: Positive abdominal pain, vomiting, diarrhea Genitourinary: Negative for dysuria. Musculoskeletal: Negative for back pain. Skin: Negative for rash. Neurological: Negative for headaches, focal weakness or numbness. All other ROS negative ____________________________________________   PHYSICAL EXAM:  VITAL SIGNS: ED Triage Vitals  Enc Vitals Group     BP 02/29/20 0702 (!) 166/113     Pulse Rate 02/29/20 0702 (!) 106     Resp 02/29/20 0702 (!) 22     Temp --      Temp src --      SpO2 02/29/20 0702 100 %     Weight 02/29/20 0700 144 lb (65.3 kg)     Height 02/29/20 0700 5' 8"  (1.727 m)     Head Circumference --      Peak Flow --      Pain Score 02/29/20 0700 10     Pain Loc --      Pain Edu? --      Excl. in [place]? --     Constitutional: Alert and oriented.  Patient is tearful, appears to be in pain Eyes: Conjunctivae are normal. EOMI. Head: Atraumatic. Nose: No congestion/rhinnorhea. Mouth/Throat: Mucous membranes are moist.   Neck: No stridor. Trachea Midline. FROM Cardiovascular: Normal rate, regular rhythm. Grossly normal heart sounds.  Good peripheral circulation. Respiratory: Normal respiratory effort.  No retractions. Lungs CTAB. Gastrointestinal: Tender throughout her abdomen.  No distention. No abdominal bruits.  Musculoskeletal: No lower extremity tenderness nor edema.  No joint effusions. Neurologic:  Normal speech and language. No gross focal neurologic deficits are appreciated.  Skin:  Skin is warm, dry and intact. No rash noted. Psychiatric: Mood and affect are normal. Speech and behavior are normal. GU: Deferred   ____________________________________________   LABS (all labs ordered are  listed, but only abnormal results are displayed)  Labs Reviewed  CBC WITH DIFFERENTIAL/PLATELET - Abnormal; Notable for the following components:  Result Value   WBC 3.9 (*)    All other components within normal limits  COMPREHENSIVE METABOLIC PANEL - Abnormal; Notable for the following components:   Glucose, Bld 146 (*)    AST 69 (*)    ALT 54 (*)    Alkaline Phosphatase 202 (*)    All other components within normal limits  URINALYSIS, COMPLETE (UACMP) WITH MICROSCOPIC - Abnormal; Notable for the following components:   Color, Urine YELLOW (*)    APPearance CLEAR (*)    Specific Gravity, Urine >1.046 (*)    Ketones, ur 20 (*)    All other components within normal limits  MAGNESIUM - Abnormal; Notable for the following components:   Magnesium 1.3 (*)    All other components within normal limits  GASTROINTESTINAL PANEL BY PCR, STOOL (REPLACES STOOL CULTURE)  C DIFFICILE QUICK SCREEN W PCR REFLEX  LIPASE, BLOOD  POC URINE PREG, ED   ____________________________________________   RADIOLOGY  Official radiology report(s): CT ABDOMEN PELVIS W CONTRAST  Result Date: 02/29/2020 CLINICAL DATA:  Abdominal distension, generalized abdominal pain EXAM: CT ABDOMEN AND PELVIS WITH CONTRAST TECHNIQUE: Multidetector CT imaging of the abdomen and pelvis was performed using the standard protocol following bolus administration of intravenous contrast. CONTRAST:  153m OMNIPAQUE IOHEXOL 300 MG/ML  SOLN COMPARISON:  01/14/2020 FINDINGS: Lower chest: Numerous nodules again identified within the bilateral lung bases, many of which are cavitary. No appreciable interval change from previous CT. Hepatobiliary: Focal fatty infiltration adjacent to the falciform ligament. No suspicious hepatic lesion. Prior cholecystectomy. Similar degree of mild intrahepatic biliary dilatation. Stable dilation of the common bile duct measuring approximately 14 mm in diameter at the level of the head of the pancreas.  Pancreas: Unremarkable. No pancreatic ductal dilatation or surrounding inflammatory changes. Spleen: Normal in size without focal abnormality. Adrenals/Urinary Tract: Unremarkable adrenal glands. Bilateral kidneys enhance symmetrically. Punctate 1-2 mm nonobstructing stone within the inferior pole of the right kidney. No suspicious renal lesion. No hydronephrosis. The ureters are nondilated. Previously described enhancing soft tissue in the region of the distal left ureter is not well seen on the current study given the lack of distention of the left ureter. Urinary bladder is decompressed, limiting its evaluation. There may be mild urinary bladder wall thickening, slightly more pronounced on the left, similar to prior. Stomach/Bowel: Prior right hemicolectomy. Moderate volume of stool within the colon. No focal colonic wall thickening or pericolonic inflammatory changes. Stomach and small bowel appear within normal limits. No dilated loops of bowel to suggest obstruction. Vascular/Lymphatic: Abdominal aortic atherosclerosis without aneurysm. IVC filter has been removed. No abdominopelvic lymphadenopathy. Reproductive: Unchanged appearance of the uterus and cervix. No adnexal masses. Other: Unchanged stranding within the low pelvis, likely post treatment changes. No free fluid, free air, or abdominal wall hernia. Musculoskeletal: Chronic bilateral sacral insufficiency fractures, unchanged. Patchy sclerosis in the parasymphyseal left pubic bone, unchanged. Degenerative changes within the lower lumbar spine. IMPRESSION: 1. No acute abdominopelvic findings. 2. Previously seen left-sided hydronephrosis has resolved. 3. Numerous nodules again identified within the bilateral lung bases, many of which are cavitary. No appreciable interval change from previous CT. 4. Previously described enhancing soft tissue in the region of the distal left ureter is not well seen on the current study given the lack of distention of the  left ureter. There may be mild urinary bladder wall thickening, slightly more pronounced on the left, similar to prior. 5. Chronic bilateral sacral insufficiency fractures, unchanged. Patchy sclerosis in the parasymphyseal left pubic bone,  unchanged. Aortic Atherosclerosis (ICD10-I70.0). Electronically Signed   By: Davina Poke D.O.   On: 02/29/2020 09:11    ____________________________________________   PROCEDURES  Procedure(s) performed (including Critical Care):  Procedures   ____________________________________________   INITIAL IMPRESSION / ASSESSMENT AND PLAN / ED COURSE  ISHITA MCNERNEY was evaluated in Emergency Department on 02/29/2020 for the symptoms described in the history of present illness. She was evaluated in the context of the global COVID-19 pandemic, which necessitated consideration that the patient might be at risk for infection with the SARS-CoV-2 virus that causes COVID-19. Institutional protocols and algorithms that pertain to the evaluation of patients at risk for COVID-19 are in a state of rapid change based on information released by regulatory bodies including the CDC and federal and state organizations. These policies and algorithms were followed during the patient's care in the ED.    Patient is a 54 year old with complex medical history including metastatic cancer and prior DVT on blood thinner who comes in for diffuse abdominal pain, vomiting, diarrhea.  Patient does have vomitus is yellowish in color in the bag next to her.  Given patient's significant history will get CT scan to make sure no evidence of obstruction, perforation, diverticulitis, appendicitis, worsening metastatic cancer.  Will get labs to evaluate for electrolyte abnormalities, AKI.  If work-up is negative consider this being secondary to withdrawal from missing her home pain medicine.  I did review the database and she has not had a fill in over a month.  8:03 AM reevaluated patient  stated that she is not feeling better after a dose of IV Dilaudid 1 mg.  Patient looks to be more comfortable but still moaning in pain.  Will give another 1 dose of Dilaudid to help facilitate CT scan.  Patient states that she is not menstruating years.  Denies any concern for pregnancy.  10:08 AM.  Patient updated on CT results.  Appears to be chronic findings.  Concern for some bladder wall thickening but patient has a normal UA.  No symptoms of UTI.  Reevaluated patient she states that her pain is much better.  Although started to creep back.  We discussed getting 1 more dose of IV narcotics due to her stage IV metastatic cancer.  I did review her database and she is not had any fills since April 22  I discussed with her that given her condition that we could prescribe her enough medications for today until she can talk to her palliative care doctor tomorrow versus if her pain was out of control we could admit her to the hospital.  Patient states that she would prefer to go home if possible and was agreeable to this plan.  She understands that if her symptoms are worsening that she can return to the ER for further work-up and evaluation  We will give 1 more dose of IV Dilaudid as well as her long-acting oxycodone prescribed some of her long-acting and short acting oxycodone to get her until her palliative care doctor can talk to her tomorrow  Patient is tolerating p.o.  I discussed the provisional nature of ED diagnosis, the treatment so far, the ongoing plan of care, follow up appointments and return precautions with the patient and any family or support people present. They expressed understanding and agreed with the plan, discharged home.    ____________________________________________   FINAL CLINICAL IMPRESSION(S) / ED DIAGNOSES   Final diagnoses:  Metastatic malignant neoplasm, unspecified site [facility])  Generalized abdominal pain  MEDICATIONS GIVEN DURING THIS  VISIT:  Medications  heparin lock flush 100 unit/mL (has no administration in time range)  HYDROmorphone (DILAUDID) injection 1 mg (has no administration in time range)  oxyCODONE (OXYCONTIN) 12 hr tablet 30 mg (has no administration in time range)  sodium chloride 0.9 % bolus 1,000 mL (1,000 mLs Intravenous New Bag/Given 02/29/20 0732)  HYDROmorphone (DILAUDID) injection 1 mg (1 mg Intravenous Given 02/29/20 0808)  ondansetron (ZOFRAN) injection 4 mg (4 mg Intravenous Given 02/29/20 0732)  magnesium sulfate IVPB 2 g 50 mL (2 g Intravenous New Bag/Given 02/29/20 0812)  iohexol (OMNIPAQUE) 300 MG/ML solution 100 mL (100 mLs Intravenous Contrast Given 02/29/20 0820)     ED Discharge Orders         Ordered    oxyCODONE 30 MG 12 hr tablet  Every 8 hours     02/29/20 1011    oxyCODONE (ROXICODONE) 15 MG immediate release tablet  Every  3 hours PRN     02/29/20 1011           Note:  This document was prepared using Dragon voice recognition software and may include unintentional dictation errors.   Vanessa , MD 02/29/20 [phone]

## 2020-03-01 ENCOUNTER — Encounter: Payer: Self-pay | Admitting: Internal Medicine

## 2020-03-01 ENCOUNTER — Other Ambulatory Visit: Payer: Self-pay

## 2020-03-01 NOTE — Progress Notes (Signed)
03/01/2020 - Spoke with patient for prescreening prior to her apt with Dr. Rogue Bussing.  (20 mins spent on phone with patient). Pt states she was evaluated in the emergency room yesterday for nausea/vomiting/diarrhea and abdominal pain. She was given a script for narcotics (oxycodone 5 mg and oxycodone 30 mg) from the ER and IV magnesium and CT workup. She did not fill this script. I explained to her that Merrily Pew had already RF both of these narcotics on 5/14. She states that she was not aware. I reminded her that she sent a mychart msg last week to request for her narcotic RFs. She states that she did get her zofran odt refilled and her pharmacy did not inform her that any narcotics were ready for her to be picked up.  Pt reports multiple episodes of nausea/vomiting and diarrhea today. She took her zofran odt with no relief. While I on the phone with the patient, the patient took her phenergan. She reported relief of nausea with phenergan at the end of the phone call.  She reports a "suspected" theft with narcotics with home health PT agency." "I can't prove it but I was missing some pills after my PT visit." She has not reported any theft to appropriate personnel.  Active listening provided and RN suggested pt to get a lock box for her medications to prevent anyone from tampering with narcotics. She reports that her sister also recommended a lock box in the home.  Pt does admit to having children visit her in the home on occasion. We discussed the safety risk factors of keeping her medications out of reach from children.  Pt admits to having feelings of "being overwhelmed" with her symptoms. I don't know how long I can keep going through this." She has social support with her family. A family friend, Angus Palms (who is a [employer] scientist), will be staying with her family for the next 2 months and she finds this very helpful/supportive. Per patient, Angus Palms States that she is always attentive to her needs and she arrived this  weekend to her home.

## 2020-03-02 ENCOUNTER — Inpatient Hospital Stay: Payer: Medicaid Other

## 2020-03-02 ENCOUNTER — Inpatient Hospital Stay (HOSPITAL_BASED_OUTPATIENT_CLINIC_OR_DEPARTMENT_OTHER): Payer: Medicaid Other | Admitting: Hospice and Palliative Medicine

## 2020-03-02 ENCOUNTER — Other Ambulatory Visit: Payer: Self-pay | Admitting: Internal Medicine

## 2020-03-02 ENCOUNTER — Inpatient Hospital Stay (HOSPITAL_BASED_OUTPATIENT_CLINIC_OR_DEPARTMENT_OTHER): Payer: Medicaid Other | Admitting: Internal Medicine

## 2020-03-02 ENCOUNTER — Other Ambulatory Visit: Payer: Self-pay

## 2020-03-02 VITALS — BP 126/76 | HR 100 | Temp 96.3°F | Resp 18 | Wt 146.4 lb

## 2020-03-02 DIAGNOSIS — G8929 Other chronic pain: Secondary | ICD-10-CM

## 2020-03-02 DIAGNOSIS — F331 Major depressive disorder, recurrent, moderate: Secondary | ICD-10-CM

## 2020-03-02 DIAGNOSIS — G893 Neoplasm related pain (acute) (chronic): Secondary | ICD-10-CM

## 2020-03-02 DIAGNOSIS — C538 Malignant neoplasm of overlapping sites of cervix uteri: Secondary | ICD-10-CM | POA: Diagnosis not present

## 2020-03-02 DIAGNOSIS — F329 Major depressive disorder, single episode, unspecified: Secondary | ICD-10-CM | POA: Diagnosis not present

## 2020-03-02 DIAGNOSIS — F32A Depression, unspecified: Secondary | ICD-10-CM

## 2020-03-02 DIAGNOSIS — Z79891 Long term (current) use of opiate analgesic: Secondary | ICD-10-CM

## 2020-03-02 DIAGNOSIS — C539 Malignant neoplasm of cervix uteri, unspecified: Secondary | ICD-10-CM | POA: Diagnosis not present

## 2020-03-02 DIAGNOSIS — F1190 Opioid use, unspecified, uncomplicated: Secondary | ICD-10-CM | POA: Diagnosis not present

## 2020-03-02 DIAGNOSIS — Z95828 Presence of other vascular implants and grafts: Secondary | ICD-10-CM

## 2020-03-02 LAB — CBC WITH DIFFERENTIAL/PLATELET
Abs Immature Granulocytes: 0.01 10*3/uL (ref 0.00–0.07)
Basophils Absolute: 0 10*3/uL (ref 0.0–0.1)
Basophils Relative: 0 %
Eosinophils Absolute: 0.1 10*3/uL (ref 0.0–0.5)
Eosinophils Relative: 2 %
HCT: 34.5 % — ABNORMAL LOW (ref 36.0–46.0)
Hemoglobin: 11.3 g/dL — ABNORMAL LOW (ref 12.0–15.0)
Immature Granulocytes: 0 %
Lymphocytes Relative: 29 %
Lymphs Abs: 1.5 10*3/uL (ref 0.7–4.0)
MCH: 31 pg (ref 26.0–34.0)
MCHC: 32.8 g/dL (ref 30.0–36.0)
MCV: 94.8 fL (ref 80.0–100.0)
Monocytes Absolute: 0.3 10*3/uL (ref 0.1–1.0)
Monocytes Relative: 7 %
Neutro Abs: 3.1 10*3/uL (ref 1.7–7.7)
Neutrophils Relative %: 62 %
Platelets: 167 10*3/uL (ref 150–400)
RBC: 3.64 MIL/uL — ABNORMAL LOW (ref 3.87–5.11)
RDW: 14.4 % (ref 11.5–15.5)
WBC: 5 10*3/uL (ref 4.0–10.5)
nRBC: 0 % (ref 0.0–0.2)

## 2020-03-02 LAB — COMPREHENSIVE METABOLIC PANEL
ALT: 46 U/L — ABNORMAL HIGH (ref 0–44)
AST: 49 U/L — ABNORMAL HIGH (ref 15–41)
Albumin: 3.4 g/dL — ABNORMAL LOW (ref 3.5–5.0)
Alkaline Phosphatase: 206 U/L — ABNORMAL HIGH (ref 38–126)
Anion gap: 7 (ref 5–15)
BUN: 14 mg/dL (ref 6–20)
CO2: 28 mmol/L (ref 22–32)
Calcium: 8.8 mg/dL — ABNORMAL LOW (ref 8.9–10.3)
Chloride: 100 mmol/L (ref 98–111)
Creatinine, Ser: 0.81 mg/dL (ref 0.44–1.00)
GFR calc Af Amer: >60 mL/min (ref >60)
GFR calc non Af Amer: >60 mL/min (ref >60)
Glucose, Bld: 89 mg/dL (ref 70–99)
Potassium: 3.9 mmol/L (ref 3.5–5.1)
Sodium: 135 mmol/L (ref 135–145)
Total Bilirubin: 0.5 mg/dL (ref 0.3–1.2)
Total Protein: 6.9 g/dL (ref 6.5–8.1)

## 2020-03-02 LAB — MAGNESIUM: Magnesium: 1.4 mg/dL — ABNORMAL LOW (ref 1.7–2.4)

## 2020-03-02 MED ORDER — HEPARIN SOD (PORK) LOCK FLUSH 100 UNIT/ML IV SOLN
500.0000 [IU] | Freq: Once | INTRAVENOUS | Status: AC | PRN
  Administered 2020-03-02: 500 [IU]
  Filled 2020-03-02: qty 5

## 2020-03-02 MED ORDER — SODIUM CHLORIDE 0.9% FLUSH
10.0000 mL | INTRAVENOUS | Status: DC | PRN
  Administered 2020-03-02: 10 mL via INTRAVENOUS
  Filled 2020-03-02: qty 10

## 2020-03-02 MED ORDER — CYANOCOBALAMIN 1000 MCG/ML IJ SOLN: 1000.0000 ug | INTRAMUSCULAR | Status: DC

## 2020-03-02 MED ORDER — MAGNESIUM SULFATE 4 GM/100ML IV SOLN
4.0000 g | Freq: Once | INTRAVENOUS | Status: AC
  Administered 2020-03-02: 4 g via INTRAVENOUS
  Filled 2020-03-02: qty 100

## 2020-03-02 MED ORDER — OXYCODONE-ACETAMINOPHEN 5-325 MG PO TABS
2.0000 | ORAL_TABLET | Freq: Once | ORAL | Status: AC
  Administered 2020-03-02: 2 via ORAL
  Filled 2020-03-02: qty 2

## 2020-03-02 MED ORDER — SODIUM CHLORIDE 0.9 % IV SOLN
Freq: Once | INTRAVENOUS | Status: AC
  Filled 2020-03-02: qty 250

## 2020-03-02 MED ORDER — NALOXONE HCL 4 MG/0.1ML NA LIQD
NASAL | 0 refills | Status: AC
Start: 2020-03-02 — End: ?

## 2020-03-02 NOTE — Progress Notes (Signed)
[facility]  [phone] [phone]   Name: Joanna Hall Date: 03/02/2020 MRN: [id number]  DOB: 20-Mar-1966  Patient Care Team: Towanda Malkin, MD as PCP - General (Internal Medicine) Mellody Drown, MD as Consulting Physician (Obstetrics and Gynecology) [name], MD as Consulting Physician (Gastroenterology) Michael Boston, MD as Consulting Physician (General Surgery) Lovell Sheehan, MD as Consulting Physician (Orthopedic Surgery) Cammie Sickle, MD as Consulting Physician (Oncology) Earnestine Leys, MD as Consulting Physician (Orthopedic Surgery) Jason Coop, NP as Nurse Practitioner Emmi Wertheim, Kirt Boys, NP as Nurse Practitioner (Hospice and Palliative Medicine)    REASON FOR CONSULTATION: Joanna Hall is a 54 y.o. 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 Patienthas multiple recent hospitalizations and ER visits for nausea and abdominal pain.  Patient underwent right total knee arthroplasty on 12/24/2019.  She was subsequently readmitted 12/29/2019 with right knee pain and swelling.  CT revealed a large suprapatellar knee joint effusion with internal hyperdense contents suggesting hemarthrosis likely secondary to anticoagulation.  Anticoagulation was discontinued and patient had an IVC filter placed.  She has had severe pain.  Palliative care was consulted to help address symptoms and goals..    SOCIAL HISTORY:     reports that she quit smoking about 13 years ago. Her smoking use included cigarettes. She has a 2.50 pack-year smoking history. She has never used smokeless tobacco. She reports previous alcohol use. She reports current drug use. Drug: Marijuana.   Patient  is not married. She lives at home with her mother and father. She has an adult daughter who lives in [place]. She had another daughter who is now deceased.  ADVANCE DIRECTIVES:  DNR on file  CODE STATUS: DNR  PAST MEDICAL HISTORY: Past Medical History:  Diagnosis Date  . Abdominal pain 06/10/2018  . Abnormal cervical Papanicolaou smear 09/18/2017  . Anxiety   . Aortic atherosclerosis ([place])   . Arthritis    neck and knees  . Blood clots in brain    both lungs and right kidney  . Blood transfusion without reported diagnosis   . Cervical cancer ([place]) 09/2016   mets lung  . Chronic anal fissure   . Chronic diarrhea   . Dyspnea   . Erosive gastropathy 09/18/2017  . Factor V Leiden mutation ([place])   . Fecal incontinence   . Genital warts   . GERD (gastroesophageal reflux disease)   . GI bleed 10/08/2018  . Heart murmur   . Hematochezia   . Hemorrhoids   . 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 Hepatitis, chronic ([place]) 05/05/2017  . History of cancer chemotherapy    completed 06/2017  . History of Clostridium difficile infection    while undergoing chemo.  Negative test 10/2017  . Ileocolic anastomotic leak   . 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 Intestinal infection due to Clostridium difficile 09/18/2017  . Lung cancer ([place])   . Macrocytic anemia with vitamin B12 deficiency   . Multiple gastric ulcers   . Nausea vomiting and diarrhea   . Pancolitis ([place]) 07/27/2018  . Perianal condylomata   . Pneumonia    History of  . Pulmonary nodules   . Rectal bleeding   .  Small bowel obstruction ([place]) 08/2017  . Stiff neck    limited right turn  . Vitamin D deficiency     PAST SURGICAL HISTORY:  Past Surgical History:  Procedure Laterality Date  . CHOLECYSTECTOMY    . COLON SURGERY  08/2017   resection  . COLONOSCOPY WITH PROPOFOL N/A 12/20/2017   Procedure: COLONOSCOPY WITH PROPOFOL;  Surgeon: [name], MD;  Location[facility] ENDOSCOPY;  Service: Gastroenterology;  Laterality: N/A;  . COLONOSCOPY WITH PROPOFOL N/A 07/30/2018   Procedure: COLONOSCOPY WITH PROPOFOL;  Surgeon: [name], MD;  Location: [facility] ENDOSCOPY;  Service: Gastroenterology;  Laterality: N/A;  . COLONOSCOPY WITH PROPOFOL N/A 10/10/2018   Procedure: COLONOSCOPY WITH PROPOFOL;  Surgeon: Lucilla Lame, MD;  Location: [facility] ENDOSCOPY;  Service: Endoscopy;  Laterality: N/A;  . DIAGNOSTIC LAPAROSCOPY    . ESOPHAGOGASTRODUODENOSCOPY (EGD) WITH PROPOFOL N/A 12/20/2017   Procedure: ESOPHAGOGASTRODUODENOSCOPY (EGD) WITH PROPOFOL;  Surgeon: [name], MD;  Location: [place];  Service: Gastroenterology;  Laterality: N/A;  . ESOPHAGOGASTRODUODENOSCOPY (EGD) WITH PROPOFOL  07/30/2018   Procedure: ESOPHAGOGASTRODUODENOSCOPY (EGD) WITH PROPOFOL;  Surgeon: [name], MD;  Location: [facility] ENDOSCOPY;  Service: Gastroenterology;;  . Otho Darner SIGMOIDOSCOPY N/A 11/21/2018   Procedure: FLEXIBLE SIGMOIDOSCOPY;  Surgeon: [name], MD;  Location: [facility] ENDOSCOPY;  Service: Gastroenterology;  Laterality: N/A;  . IVC FILTER INSERTION N/A 01/01/2020   Procedure: IVC FILTER INSERTION;  Surgeon: Algernon Huxley, MD;  Location: [place] CV LAB;  Service: Cardiovascular;  Laterality: N/A;  . IVC FILTER REMOVAL N/A 02/23/2020   Procedure: IVC FILTER REMOVAL;  Surgeon: Algernon Huxley, MD;  Location: [place] CV LAB;  Service: Cardiovascular;  Laterality: N/A;  . LAPAROTOMY N/A 08/31/2017   Procedure: EXPLORATORY LAPAROTOMY for SBO, ileocolectomy, removal of piece of uterine wall;  Surgeon: [name], MD;  Location: [facility] ORS;  Service: General;  Laterality: N/A;  . LASER ABLATION CONDOLAMATA N/A 02/22/2018   Procedure: LASER ABLATION/REMOVAL OF PZWCHENIDPO AROUND ANUS AND VAGINA;  Surgeon: Michael Boston, MD;  Location: [place];  Service: General;  Laterality: N/A;  . OOPHORECTOMY    . PORTA  CATH INSERTION N/A 05/13/2018   Procedure: PORTA CATH INSERTION;  Surgeon: Katha Cabal, MD;  Location: [place] CV LAB;  Service: Cardiovascular;  Laterality: N/A;  . SMALL INTESTINE SURGERY    . TANDEM RING INSERTION     x3  . THORACOTOMY    . TOTAL KNEE ARTHROPLASTY Left 04/24/2018   Procedure: TOTAL KNEE ARTHROPLASTY;  Surgeon: Lovell Sheehan, MD;  Location: [facility] ORS;  Service: Orthopedics;  Laterality: Left;  . TOTAL KNEE ARTHROPLASTY Right 12/24/2019   Procedure: RIGHT TOTAL KNEE ARTHROPLASTY;  Surgeon: Lovell Sheehan, MD;  Location: [facility] ORS;  Service: Orthopedics;  Laterality: Right;    HEMATOLOGY/ONCOLOGY HISTORY:  Oncology History Overview Note  # dec 2017- CERVICAL ADENO CA [[place]]; Stage IB [Dr. Christene Slates at [facility] in [place], [place];No surgery- Chemo-RT;   # 2018-sep - lung nodules [in [place]]  # AUG 20th, 2019-#1 Carbo-Taxol s/p 6 cycles- [No avastin sec to blood clots]: Feb 2020-bilateral lung nodules with 1 cm in size. March 2020- finished carbo-taxol #7; poor tolerance to [place].  # April 15 th 2020- Start Taxol weekly x 3; one week OFF;  # July 2020-CT- bil cavitary lesions- slightly bigger by few mm/overall STABLE; CT a/p-NED. STOP Taxol;  # July , 23rd 2020- Keytruda [CPS-15 ]x 4 cycles; CT mid OCT 2020-progressive  disease in the lungs.;  Stop [facility]  # Oct 22nd Alimta q 3 w  # summer-/fall 2019-Bil PE/kidney infract; factor V Leiden Leiden - xarelto [small bowel obstruction]; moved to [facility]   #Fall 2019 PE/renal infarct on Xarelto-factor V Leiden  # NGS/[facility]- PDL CPS 15; NEG for other targets**  ------------------------------------------------------------- She was treated by  Decision was made to pursue concurrent chemotherapy (weekly cisplatin) and radiation.  She received treatment from 11/2016-05/2017.  01/2017 cisplatin x 2 and carboplatin x 1 (01/29/2017) due to ARF and XRT.  XRT was followed by T  & O on 02/01/2017 and T & N 02/10/2017 and 02/20/2017.  Course was complicated by 80 pound weight loss, nausea, vomiting, electrolyte wasting (potassium and magnesium).  She describes that.  Is been sick constantly requiring at least 20 hospitalizations.  Follow-up CT chest and PET on 06/2017. Per patient, 'radiation worked' and no disease in the abdomen.  At that time she was noted to have lung nodules that were growing and follow-up imaging was scheduled for 10/2017.  She was admitted to hospital in [place] for small bowel obstruction which was managed conservatively and she was home for a week prior to traveling to [place] for Thanksgiving holiday where she has family.  She presented to ER in [place] on 08/2017 with nausea, vomiting, and lower abdominal pain.  Symptoms did not respond to conservative treatment.   CT on 08/26/2017 revealed small bowel obstruction with transition in the pelvis just superior to the uterus rather was a long segment of distal ileum with fatty wall thickening compatible with chronic inflammation and/or radiation enteritis. Imaging showed numerous pulmonary nodules consistent with metastatic disease. She underwent laparotomy and right ileocolectomy on 08/31/2017 at [facility].  Surgical findings revealed a thickened, matted, and scarred piece of distal small bowel close to the ileocecal valve.  She was discharged on 09/05/2017.  Pain markedly increased in intensity and imaging was performed on 09/11/2017 which revealed: Debris within the anterior abdominal wall incision concerning for infection versus packing material, s/p post ileo-colectomy with expected postoperative changes, mild colonic ileus, numerous pulmonary nodules highly concerning for metastatic disease, punctate nonobstructing nephrolithiasis.  Staples were removed and one was packed.  She was started on doxycycline.  Abdominal and pelvic CT without contrast on 09/11/2017 revealed debris within anterior  abdominal wall incision concerning for infection, versus packing material.   She is s/p ileocolectomy with expected postoperative changes and mild colonic ileus.  There were numerous pulmonary nodules highly concerning for metastatic disease and punctate nonobstructing nephrolithiasis. She was readmitted on 09/12/2017.  She describes the onset of lower abdominal pain on 09/09/2017.  Pain markedly increased in intensity on 09/11/2017.   Staples were removed and the wound packed.  She was started on doxycycline.  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   PET scan was ordered to evaluate enlarging lung nodules with concern for recurrent cervical cancer but scan was delayed due to insurance and need to be performed in [place].  Presented to ER on 12/03/2017 for abdominal pain and emesis.  Imaging concerning for worsening possible uterine infarct and she was admitted to hospital.  Pelvic MRI was unremarkable.  Remote scarring type changes of uterus thought to be possibly related to radiation.  She was discharged on 12/08/2017.  Underwent endoscopy and colonoscopy on 12/20/2017.    On 02/22/2018 she underwent laser ablation of condylomata around the anus and vagina under anesthesia with Dr. Johney Maine.   04/15/2018:  Chest, abdomen, and pelvis CT revealed innumerable (> 100) cavitary nodules scattered in the lungs, moderately enlarging compared to the 11/08/2017 PET-CT, suspicious for metastatic disease.  One index node in the RLL measures 1.0 x 1.1 cm (previously 0.6 x 0.6 cm).  There were no new nodules.  There was an ill-defined  wall thickening in the rectosigmoid with surrounding stranding along fascia planes, probably sequela from prior radiation therapy.  There was multilevel lumbar impingement due to spondylosis and degenerative disc disease.  There was heterogeneous enhancement in the uterus (some possibly from prior radiation therapy).   04/23/2018:  PET scan revealed numerous scattered solid and cavitary nodules in the lungs stable increased in size compared to the prior PET-CT from 11/08/2017. Largest nodule was 1.1 cm in the LUL (SUV 1.9).  These demonstrated low-grade metabolic activity up to a maximum SUV of about 2.3, increased from 11/08/2017.    Case was discussed at tumor board on 04/25/2018. Consensus to pursue CT-guided biopsy (05/06/18) which revealed: Metastatic adenocarcinoma, morphologically consistent with cervical adenocarcinoma.  She has history of chronic hepatitis C which is managed by GI.  Hepatitis C genotype is 2a/2c.  She receives B12 injections for history of B12 deficiency.  On 04/24/2018 she underwent left total knee replacement with Dr. [name]. --------------------------------------------------------   DIAGNOSIS: Cervical adenocarcinoma  STAGE: IV        ;GOALS: Palliative  CURRENT/MOST RECENT THERAPY : Alimta [C]    Malignant neoplasm of overlapping sites of cervix ([place])  01/29/2019 - 04/23/2019 Chemotherapy   The patient had PACLitaxel (TAXOL) 156 mg in sodium chloride 0.9 % 250 mL chemo infusion (</= 20m/m2), 80 mg/m2 = 156 mg, Intravenous,  Once, 3 of 4 cycles Dose modification: 65 mg/m2 (original dose 80 mg/m2, Cycle 1, Reason: Provider Judgment) Administration: 156 mg (01/29/2019), 156 mg (02/05/2019), 126 mg (02/12/2019), 126 mg (02/26/2019), 126 mg (03/13/2019), 126 mg (03/27/2019), 126 mg (03/20/2019), 126 mg (04/03/2019), 126 mg (04/10/2019)  for chemotherapy treatment.    05/08/2019 - 07/30/2019 Chemotherapy   The patient had pembrolizumab (KEYTRUDA) 200 mg in sodium chloride 0.9 % 50 mL  chemo infusion, 200 mg, Intravenous, Once, 4 of 6 cycles Administration: 200 mg (05/08/2019), 200 mg (05/29/2019), 200 mg (06/19/2019), 200 mg (07/10/2019)  for chemotherapy treatment.    08/07/2019 -  Chemotherapy   The patient had pegfilgrastim-cbqv ([facility] injection 6 mg, 6 mg, Subcutaneous, Once, 3 of 5 cycles Administration: 6 mg (08/29/2019), 6 mg (09/19/2019) PEMEtrexed (ALIMTA) 1,000 mg in sodium chloride 0.9 % 100 mL chemo infusion, 950 mg, Intravenous,  Once, 4 of 6 cycles Administration: 1,000 mg (08/07/2019), 1,000 mg (09/18/2019), 1,000 mg (11/13/2019), 1,000 mg (08/28/2019)  for chemotherapy treatment.      ALLERGIES:  is allergic to ketamine.  MEDICATIONS:  Current Outpatient Medications  Medication Sig Dispense Refill  . amitriptyline (ELAVIL) 100 MG tablet Take 1 tablet (100 mg total) by mouth at bedtime. 90 tablet 1  . diphenoxylate-atropine (LOMOTIL) 2.5-0.025 MG tablet TAKE (2) TABLETS FOUR TIMES DAILY. (Patient not taking: Reported on 02/23/2020) 240 tablet 0  . famotidine (PEPCID) 20 MG tablet Take 1 tablet (20 mg total) by mouth 2 (two) times daily. (Patient  taking differently: Take 20 mg by mouth daily as needed for heartburn. ) 60 tablet 0  . folic acid (FOLVITE) 1 MG tablet Take 1 tablet (1 mg total) by mouth daily. 30 tablet 6  . hydrOXYzine (VISTARIL) 25 MG capsule Take 1 capsule (25 mg total) by mouth 3 (three) times daily as needed for itching. 30 capsule 0  . hyoscyamine (LEVBID) 0.375 MG 12 hr tablet Take 1 tablet (0.375 mg total) by mouth 2 (two) times daily. 60 tablet 0  . Multiple Vitamins-Minerals (SUPER THERA VITE M) TABS Take 1 tablet by mouth daily.     . naloxone (NARCAN) nasal spray 4 mg/0.1 mL 1 spray into nostril upon signs of opioid overdose. Call 911. May repeat once if no response within 2-3 minutes. 1 each 0  . ondansetron (ZOFRAN ODT) 4 MG disintegrating tablet Take 1 tablet (4 mg total) by mouth every 6 (six) hours as needed for nausea or vomiting.  20 tablet 0  . [START ON 03/06/2020] oxyCODONE 30 MG 12 hr tablet Take 1 tablet (30 mg total) by mouth every 8 (eight) hours for 1 day. 3 tablet 0  . pantoprazole (PROTONIX) 40 MG tablet Take 1 tablet (40 mg total) by mouth daily. 90 tablet 3  . promethazine (PHENERGAN) 25 MG tablet Take 1 tablet (25 mg total) by mouth every 8 (eight) hours as needed for nausea or vomiting. 30 tablet 0  . rivaroxaban (XARELTO) 20 MG TABS tablet Take 1 tablet (20 mg total) by mouth daily with supper. 60 tablet 1  . triamcinolone ointment (KENALOG) 0.5 % Apply 1 application topically 2 (two) times daily. (Patient not taking: Reported on 02/23/2020) 30 g 0  . VENTOLIN HFA 108 (90 Base) MCG/ACT inhaler Inhale 1-2 puffs into the lungs every 4 (four) hours as needed for shortness of breath. 1 Inhaler 1   No current facility-administered medications for this visit.   Facility-Administered Medications Ordered in Other Visits  Medication Dose Route Frequency Provider Last Rate Last Admin  . heparin lock flush 100 unit/mL  500 Units Intravenous Once Cor[place], Melissa C, MD      . sodium chloride 0.9 % 1,000 mL with potassium chloride 20 mEq infusion   Intravenous Once Honor Loh E, NP      . sodium chloride flush (NS) 0.9 % injection 10 mL  10 mL Intravenous Once Letesha Klecker, Kirt Boys, NP        VITAL SIGNS: There were no vitals taken for this visit. There were no vitals filed for this visit.  Estimated body mass index is 22.26 kg/m as calculated from the following:   Height as of 02/29/20: _0  (1.727 m).   Weight as of an earlier encounter on 03/02/20: 146 lb 6.4 oz (66.4 kg).  LABS: CBC:    Component Value Date/Time   WBC 5.0 03/02/2020 0859   HGB 11.3 (L) 03/02/2020 0859   HCT 34.5 (L) 03/02/2020 0859   PLT 167 03/02/2020 0859   MCV 94.8 03/02/2020 0859   NEUTROABS 3.1 03/02/2020 0859   LYMPHSABS 1.5 03/02/2020 0859   MONOABS 0.3 03/02/2020 0859   EOSABS 0.1 03/02/2020 0859   BASOSABS 0.0 03/02/2020 0859    Comprehensive Metabolic Panel:    Component Value Date/Time   NA 135 03/02/2020 0859   K 3.9 03/02/2020 0859   CL 100 03/02/2020 0859   CO2 28 03/02/2020 0859   BUN 14 03/02/2020 0859   CREATININE 0.81 03/02/2020 0859   GLUCOSE 89 03/02/2020 0859  CALCIUM 8.8 (L) 03/02/2020 0859   AST 49 (H) 03/02/2020 0859   ALT 46 (H) 03/02/2020 0859   ALKPHOS 206 (H) 03/02/2020 0859   BILITOT 0.5 03/02/2020 0859   PROT 6.9 03/02/2020 0859   ALBUMIN 3.4 (L) 03/02/2020 0859    RADIOGRAPHIC STUDIES: CT ABDOMEN PELVIS W CONTRAST  Result Date: 02/29/2020 CLINICAL DATA:  Abdominal distension, generalized abdominal pain EXAM: CT ABDOMEN AND PELVIS WITH CONTRAST TECHNIQUE: Multidetector CT imaging of the abdomen and pelvis was performed using the standard protocol following bolus administration of intravenous contrast. CONTRAST:  14m OMNIPAQUE IOHEXOL 300 MG/ML  SOLN COMPARISON:  01/14/2020 FINDINGS: Lower chest: Numerous nodules again identified within the bilateral lung bases, many of which are cavitary. No appreciable interval change from previous CT. Hepatobiliary: Focal fatty infiltration adjacent to the falciform ligament. No suspicious hepatic lesion. Prior cholecystectomy. Similar degree of mild intrahepatic biliary dilatation. Stable dilation of the common bile duct measuring approximately 14 mm in diameter at the level of the head of the pancreas. Pancreas: Unremarkable. No pancreatic ductal dilatation or surrounding inflammatory changes. Spleen: Normal in size without focal abnormality. Adrenals/Urinary Tract: Unremarkable adrenal glands. Bilateral kidneys enhance symmetrically. Punctate 1-2 mm nonobstructing stone within the inferior pole of the right kidney. No suspicious renal lesion. No hydronephrosis. The ureters are nondilated. Previously described enhancing soft tissue in the region of the distal left ureter is not well seen on the current study given the lack of distention of the left  ureter. Urinary bladder is decompressed, limiting its evaluation. There may be mild urinary bladder wall thickening, slightly more pronounced on the left, similar to prior. Stomach/Bowel: Prior right hemicolectomy. Moderate volume of stool within the colon. No focal colonic wall thickening or pericolonic inflammatory changes. Stomach and small bowel appear within normal limits. No dilated loops of bowel to suggest obstruction. Vascular/Lymphatic: Abdominal aortic atherosclerosis without aneurysm. IVC filter has been removed. No abdominopelvic lymphadenopathy. Reproductive: Unchanged appearance of the uterus and cervix. No adnexal masses. Other: Unchanged stranding within the low pelvis, likely post treatment changes. No free fluid, free air, or abdominal wall hernia. Musculoskeletal: Chronic bilateral sacral insufficiency fractures, unchanged. Patchy sclerosis in the parasymphyseal left pubic bone, unchanged. Degenerative changes within the lower lumbar spine. IMPRESSION: 1. No acute abdominopelvic findings. 2. Previously seen left-sided hydronephrosis has resolved. 3. Numerous nodules again identified within the bilateral lung bases, many of which are cavitary. No appreciable interval change from previous CT. 4. Previously described enhancing soft tissue in the region of the distal left ureter is not well seen on the current study given the lack of distention of the left ureter. There may be mild urinary bladder wall thickening, slightly more pronounced on the left, similar to prior. 5. Chronic bilateral sacral insufficiency fractures, unchanged. Patchy sclerosis in the parasymphyseal left pubic bone, unchanged. Aortic Atherosclerosis (ICD10-I70.0). Electronically Signed   By: NDavina PokeD.O.   On: 02/29/2020 09:11   [place]  Result Date: 02/26/2020 CLINICAL DATA:  Acute generalized abdominal pain. EXAM: RENAL / URINARY TRACT ULTRASOUND COMPLETE COMPARISON:  January 14, 2020. FINDINGS: Right Kidney: Renal  measurements: 11.5 x 4.7 x 3.8 cm = volume: 108 mL . Echogenicity within normal limits. No mass or hydronephrosis visualized. Left Kidney: Renal measurements: 11.3 x 5.0 x 4.4 cm = volume: 130 mL. Echogenicity within normal limits. No mass or hydronephrosis visualized. Bladder: Appears normal for degree of bladder distention. Other: None. IMPRESSION: Normal renal ultrasound. Electronically Signed   By: JBobbe MedicoD.  On: 02/26/2020 16:52   PERIPHERAL VASCULAR CATHETERIZATION  Result Date: 02/23/2020 See op note  VAS [place] LOWER EXTREMITY VENOUS (DVT)  Result Date: 02/17/2020  Lower Venous DVTStudy Other Indications: Hx PE. Risk Factors: Surgery IVC filter placed 01/01/2020. Performing Technologist: Concha [place] RVT  Examination Guidelines: A complete evaluation includes B-mode imaging, spectral Doppler, color Doppler, and power Doppler as needed of all accessible portions of each vessel. Bilateral testing is considered an integral part of a complete examination. Limited examinations for reoccurring indications may be performed as noted. The reflux portion of the exam is performed with the patient in reverse Trendelenburg.  +---------+---------------+---------+-----------+----------+--------------+ RIGHT    CompressibilityPhasicitySpontaneityPropertiesThrombus Aging +---------+---------------+---------+-----------+----------+--------------+ CFV      Full           Yes      Yes                                 +---------+---------------+---------+-----------+----------+--------------+ SFJ      Full           Yes      Yes                                 +---------+---------------+---------+-----------+----------+--------------+ FV Prox  Full           Yes      Yes                                 +---------+---------------+---------+-----------+----------+--------------+ FV Mid   Full           Yes      Yes                                  +---------+---------------+---------+-----------+----------+--------------+ FV DistalFull           Yes      Yes                                 +---------+---------------+---------+-----------+----------+--------------+ PFV      Full           Yes      Yes                                 +---------+---------------+---------+-----------+----------+--------------+ POP      Full           Yes      Yes                                 +---------+---------------+---------+-----------+----------+--------------+ PTV      Full           Yes      Yes                                 +---------+---------------+---------+-----------+----------+--------------+ GSV      Full           Yes      Yes                                 +---------+---------------+---------+-----------+----------+--------------+  SSV      Full           Yes      Yes                                 +---------+---------------+---------+-----------+----------+--------------+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

## 2020-03-02 NOTE — Progress Notes (Signed)
Pt stable at discharge.

## 2020-03-02 NOTE — Progress Notes (Signed)
Pt states nurse called for pre assessment yesterday, states still having nausea and vomiting.
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
## 2020-03-04 ENCOUNTER — Other Ambulatory Visit: Payer: Self-pay

## 2020-03-04 ENCOUNTER — Other Ambulatory Visit: Payer: Medicaid Other | Admitting: Primary Care

## 2020-03-04 ENCOUNTER — Telehealth: Payer: Self-pay | Admitting: Primary Care

## 2020-03-04 NOTE — Telephone Encounter (Signed)
Rec'd message that patient had left a voicemail message yesterday wanting to reschedule her f/u visit scheduled for 03/04/20.  Spoke with patient and we have reschedule the visit to 03/18/20 @ 10 AM.

## 2020-03-05 ENCOUNTER — Inpatient Hospital Stay: Payer: Medicaid Other

## 2020-03-05 ENCOUNTER — Other Ambulatory Visit: Payer: Self-pay

## 2020-03-05 ENCOUNTER — Telehealth: Payer: Self-pay | Admitting: *Deleted

## 2020-03-05 ENCOUNTER — Inpatient Hospital Stay (HOSPITAL_BASED_OUTPATIENT_CLINIC_OR_DEPARTMENT_OTHER): Payer: Medicaid Other | Admitting: Oncology

## 2020-03-05 VITALS — BP 108/97 | HR 110 | Temp 96.5°F | Resp 20

## 2020-03-05 DIAGNOSIS — C539 Malignant neoplasm of cervix uteri, unspecified: Secondary | ICD-10-CM | POA: Diagnosis not present

## 2020-03-05 DIAGNOSIS — R112 Nausea with vomiting, unspecified: Secondary | ICD-10-CM

## 2020-03-05 DIAGNOSIS — I951 Orthostatic hypotension: Secondary | ICD-10-CM

## 2020-03-05 DIAGNOSIS — R197 Diarrhea, unspecified: Secondary | ICD-10-CM

## 2020-03-05 DIAGNOSIS — R1033 Periumbilical pain: Secondary | ICD-10-CM

## 2020-03-05 DIAGNOSIS — Z95828 Presence of other vascular implants and grafts: Secondary | ICD-10-CM

## 2020-03-05 LAB — COMPREHENSIVE METABOLIC PANEL
ALT: 55 U/L — ABNORMAL HIGH (ref 0–44)
AST: 71 U/L — ABNORMAL HIGH (ref 15–41)
Albumin: 3.4 g/dL — ABNORMAL LOW (ref 3.5–5.0)
Alkaline Phosphatase: 235 U/L — ABNORMAL HIGH (ref 38–126)
Anion gap: 8 (ref 5–15)
BUN: 10 mg/dL (ref 6–20)
CO2: 25 mmol/L (ref 22–32)
Calcium: 9 mg/dL (ref 8.9–10.3)
Chloride: 100 mmol/L (ref 98–111)
Creatinine, Ser: 0.98 mg/dL (ref 0.44–1.00)
GFR calc Af Amer: >60 mL/min (ref >60)
GFR calc non Af Amer: >60 mL/min (ref >60)
Glucose, Bld: 98 mg/dL (ref 70–99)
Potassium: 3.4 mmol/L — ABNORMAL LOW (ref 3.5–5.1)
Sodium: 133 mmol/L — ABNORMAL LOW (ref 135–145)
Total Bilirubin: 0.6 mg/dL (ref 0.3–1.2)
Total Protein: 7.2 g/dL (ref 6.5–8.1)

## 2020-03-05 LAB — CBC WITH DIFFERENTIAL/PLATELET
Abs Immature Granulocytes: 0.01 10*3/uL (ref 0.00–0.07)
Basophils Absolute: 0 10*3/uL (ref 0.0–0.1)
Basophils Relative: 1 %
Eosinophils Absolute: 0.1 10*3/uL (ref 0.0–0.5)
Eosinophils Relative: 3 %
HCT: 38.2 % (ref 36.0–46.0)
Hemoglobin: 12.6 g/dL (ref 12.0–15.0)
Immature Granulocytes: 0 %
Lymphocytes Relative: 31 %
Lymphs Abs: 1.1 10*3/uL (ref 0.7–4.0)
MCH: 30.6 pg (ref 26.0–34.0)
MCHC: 33 g/dL (ref 30.0–36.0)
MCV: 92.7 fL (ref 80.0–100.0)
Monocytes Absolute: 0.3 10*3/uL (ref 0.1–1.0)
Monocytes Relative: 8 %
Neutro Abs: 2 10*3/uL (ref 1.7–7.7)
Neutrophils Relative %: 57 %
Platelets: 194 10*3/uL (ref 150–400)
RBC: 4.12 MIL/uL (ref 3.87–5.11)
RDW: 13.9 % (ref 11.5–15.5)
WBC: 3.4 10*3/uL — ABNORMAL LOW (ref 4.0–10.5)
nRBC: 0 % (ref 0.0–0.2)

## 2020-03-05 LAB — MAGNESIUM: Magnesium: 1.4 mg/dL — ABNORMAL LOW (ref 1.7–2.4)

## 2020-03-05 MED ORDER — ONDANSETRON HCL 4 MG/2ML IJ SOLN
8.0000 mg | Freq: Once | INTRAMUSCULAR | Status: AC
  Administered 2020-03-05: 8 mg via INTRAVENOUS
  Filled 2020-03-05: qty 4

## 2020-03-05 MED ORDER — SODIUM CHLORIDE 0.9 % IV SOLN
INTRAVENOUS | Status: DC
  Filled 2020-03-05 (×2): qty 250

## 2020-03-05 MED ORDER — MORPHINE SULFATE 2 MG/ML IJ SOLN
2.0000 mg | Freq: Once | INTRAMUSCULAR | Status: AC
  Administered 2020-03-05: 2 mg via INTRAVENOUS
  Filled 2020-03-05: qty 1

## 2020-03-05 MED ORDER — HEPARIN SOD (PORK) LOCK FLUSH 100 UNIT/ML IV SOLN
INTRAVENOUS | Status: AC
  Filled 2020-03-05: qty 5

## 2020-03-05 MED ORDER — DEXAMETHASONE SODIUM PHOSPHATE 10 MG/ML IJ SOLN
10.0000 mg | Freq: Once | INTRAMUSCULAR | Status: AC
  Administered 2020-03-05: 10 mg via INTRAVENOUS
  Filled 2020-03-05: qty 1

## 2020-03-05 MED ORDER — HEPARIN SOD (PORK) LOCK FLUSH 100 UNIT/ML IV SOLN
500.0000 [IU] | Freq: Once | INTRAVENOUS | Status: AC | PRN
  Administered 2020-03-05: 500 [IU]
  Filled 2020-03-05: qty 5

## 2020-03-05 MED ORDER — SODIUM CHLORIDE 0.9% FLUSH
10.0000 mL | Freq: Once | INTRAVENOUS | Status: AC
  Administered 2020-03-05: 10 mL via INTRAVENOUS
  Filled 2020-03-05: qty 10

## 2020-03-05 MED ORDER — MAGNESIUM SULFATE 4 GM/100ML IV SOLN
4.0000 g | Freq: Once | INTRAVENOUS | Status: AC
  Administered 2020-03-05: 4 g via INTRAVENOUS
  Filled 2020-03-05: qty 100

## 2020-03-05 NOTE — Telephone Encounter (Signed)
Patient coming in this morning at 915 for labs, [facility], IV fluids after discussing with staff and patient.

## 2020-03-05 NOTE — Telephone Encounter (Signed)
Patient called reporting several days of vomiting and diarrhea and is asking to come in for IV fluids and possible Mg+. Please advise

## 2020-03-05 NOTE — Progress Notes (Signed)
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 There may be mild urinary bladder wall thickening, slightly more pronounced on the left, similar to prior. Stomach/Bowel: Prior right hemicolectomy. Moderate volume of stool within the colon. No focal colonic wall thickening or pericolonic inflammatory changes. Stomach and small bowel appear within normal limits. No dilated loops of bowel to suggest obstruction. Vascular/Lymphatic:  Abdominal aortic atherosclerosis without aneurysm. IVC filter has been removed. No abdominopelvic lymphadenopathy. Reproductive: Unchanged appearance of the uterus and cervix. No adnexal masses. Other: Unchanged stranding within the low pelvis, likely post treatment changes. No free fluid, free air, or abdominal wall hernia. Musculoskeletal: Chronic bilateral sacral insufficiency fractures, unchanged. Patchy sclerosis in the parasymphyseal left pubic bone, unchanged. Degenerative changes within the lower lumbar spine. IMPRESSION: 1. No acute abdominopelvic findings. 2.  Previously seen left-sided hydronephrosis has resolved. 3. Numerous nodules again identified within the bilateral lung bases, many of which are cavitary. No appreciable interval change from previous CT. 4. Previously described enhancing soft tissue in the region of the distal left ureter is not well seen on the current study given the lack of distention of the left ureter. There may be mild urinary bladder wall thickening, slightly more pronounced on the left, similar to prior. 5. Chronic bilateral sacral insufficiency fractures, unchanged. Patchy sclerosis in the parasymphyseal left pubic bone, unchanged. Aortic Atherosclerosis (ICD10-I70.0). Electronically Signed   By: Davina Poke D.O.   On: 02/29/2020 09:11   US RENAL  Result Date: 02/26/2020 CLINICAL DATA:  Acute generalized abdominal pain. EXAM: RENAL / URINARY TRACT ULTRASOUND COMPLETE COMPARISON:  January 14, 2020. FINDINGS: Right Kidney: Renal measurements: 11.5 x 4.7 x 3.8 cm = volume: 108 mL . Echogenicity within normal limits. No mass or hydronephrosis visualized. Left Kidney: Renal measurements: 11.3 x 5.0 x 4.4 cm = volume: 130 mL. Echogenicity within normal limits. No mass or hydronephrosis visualized. Bladder: Appears normal for degree of bladder distention. Other: None. IMPRESSION: Normal renal ultrasound. Electronically Signed   By: Marijo Conception M.D.   On: 02/26/2020 16:52   PERIPHERAL VASCULAR CATHETERIZATION  Result Date: 02/23/2020 See op note  VAS [place] LOWER EXTREMITY VENOUS (DVT)  Result Date: 02/17/2020  Lower Venous DVTStudy Other Indications: Hx PE. Risk Factors: Surgery IVC filter placed 01/01/2020. Performing Technologist: Concha [place] RVT  Examination Guidelines: A complete evaluation includes B-mode imaging, spectral Doppler, color Doppler, and power Doppler as needed of all accessible portions of each vessel. Bilateral testing is considered an integral part of a complete examination. Limited examinations for reoccurring  indications may be performed as noted. The reflux portion of the exam is performed with the patient in reverse Trendelenburg.  +---------+---------------+---------+-----------+----------+--------------+ RIGHT    CompressibilityPhasicitySpontaneityPropertiesThrombus Aging +---------+---------------+---------+-----------+----------+--------------+ CFV      Full           Yes      Yes                                 +---------+---------------+---------+-----------+----------+--------------+ SFJ      Full           Yes      Yes                                 +---------+---------------+---------+-----------+----------+--------------+ FV Prox  Full           Yes      Yes                                 +---------+---------------+---------+-----------+----------+--------------+ FV Mid   Full           Yes      Yes                                 +---------+---------------+---------+-----------+----------+--------------+  FV DistalFull           Yes      Yes                                 +---------+---------------+---------+-----------+----------+--------------+ PFV      Full           Yes      Yes                                 +---------+---------------+---------+-----------+----------+--------------+ POP      Full           Yes      Yes                                 +---------+---------------+---------+-----------+----------+--------------+ PTV      Full           Yes      Yes                                 +---------+---------------+---------+-----------+----------+--------------+ GSV      Full           Yes      Yes                                 +---------+---------------+---------+-----------+----------+--------------+ SSV      Full           Yes      Yes                                 +---------+---------------+---------+-----------+----------+--------------+    +---------+---------------+---------+-----------+----------+--------------+ LEFT     CompressibilityPhasicitySpontaneityPropertiesThrombus Aging +---------+---------------+---------+-----------+----------+--------------+ CFV      Full           Yes      Yes                                 +---------+---------------+---------+-----------+----------+--------------+ SFJ      Full           Yes      Yes                                 +---------+---------------+---------+-----------+----------+--------------+ FV Prox  Full           Yes      Yes                                 +---------+---------------+---------+-----------+----------+--------------+ FV Mid   Full           Yes      Yes                                 +---------+---------------+---------+-----------+----------+--------------+ FV DistalFull           Yes      Yes                                 +---------+---------------+---------+-----------+----------+--------------+  PFV      Full           Yes      Yes                                 +---------+---------------+---------+-----------+----------+--------------+ POP      Full           Yes      Yes                                 +---------+---------------+---------+-----------+----------+--------------+ PTV      Full           Yes      Yes                                 +---------+---------------+---------+-----------+----------+--------------+ GSV      Full           Yes      Yes                                 +---------+---------------+---------+-----------+----------+--------------+ SSV      Full           Yes      Yes                                 +---------+---------------+---------+-----------+----------+--------------+     Summary: BILATERAL: - No evidence of deep vein thrombosis seen in the lower extremities, bilaterally. - No evidence of superficial venous thrombosis in the lower extremities, bilaterally.   *See  table(s) above for measurements and observations. Electronically signed by Leotis Pain MD on 02/17/2020 at 10:08:31 AM.    Final     Assessment and plan- Patient is a 54 y.o. female who presents to symptom management for concerns of dehydration, electrolyte abnormalities and recent fluid loss due to nausea, vomiting and diarrhea.  Symptoms began last night.  Unclear etiology but symptoms are chronic.  Will check lab work and replace electrolytes as needed.  Labs showed low magnesium (1.4) low sodium (133), neutropenia (white count 3.4) and mild hypokalemia (potassium 3.4).  Continues to complain of persistent abdominal pain.  Renal ultrasound was normal.  Will have her see Dr. Fransisca Connors next week for further recommendations.  We will hold off on CT abdomen at this time. Not on active treatment d/t intolerance.   Plan: 1 L NaCl. 4 g magnesium. 10 mg dexamethasone. 8 mg Zofran. 2 mg morphine. Restart antidiarrheal medications-self adjust as needed for constipation  Disposition: Return to clinic on 03/10/2020 for appointment with Dr. Fransisca Connors.  RTC on 03/17/2020 for palliative care follow-up. RTC on 03/23/2020 for MD assessment, lab work.     Visit Diagnosis 1. Nausea and vomiting, intractability of vomiting not specified, unspecified vomiting type   2. Diarrhea, unspecified type   3. Periumbilical abdominal pain     Patient expressed understanding and was in agreement with this plan. She also understands that She can call clinic at any time with any questions, concerns, or complaints.   Greater than 50% was spent in counseling and coordination of care with this patient including but not limited to discussion of the relevant topics above (See  A&P) including, but not limited to diagnosis and management of acute and chronic medical conditions.   Thank you for allowing me to participate in the care of this very pleasant patient.    Jacquelin Hawking, NP [place] [facility] Cell - [phone] Pager- [phone] 03/05/2020 3:43 PM

## 2020-03-09 ENCOUNTER — Other Ambulatory Visit: Payer: Self-pay | Admitting: Gastroenterology

## 2020-03-09 ENCOUNTER — Other Ambulatory Visit: Payer: Self-pay | Admitting: Oncology

## 2020-03-09 DIAGNOSIS — K529 Noninfective gastroenteritis and colitis, unspecified: Secondary | ICD-10-CM

## 2020-03-10 ENCOUNTER — Inpatient Hospital Stay (HOSPITAL_BASED_OUTPATIENT_CLINIC_OR_DEPARTMENT_OTHER): Payer: Medicaid Other | Admitting: Obstetrics and Gynecology

## 2020-03-10 ENCOUNTER — Other Ambulatory Visit: Payer: Self-pay

## 2020-03-10 VITALS — BP 122/91 | HR 131 | Temp 96.8°F | Ht 68.0 in | Wt 147.4 lb

## 2020-03-10 DIAGNOSIS — C530 Malignant neoplasm of endocervix: Secondary | ICD-10-CM | POA: Diagnosis not present

## 2020-03-10 NOTE — Progress Notes (Signed)
Gynecologic Oncology Interval Visit   Referring Provider: Dr. Rogue Bussing  Chief Concern: Recurrent stage IB1 cervical cancer s/p chemoradiation with ileocolectomy for bowel obstruction and recurrence. Now with progressive disease in lungs despite additional systemic therapy. Requiring multiple admissions and ED visits for symptom control.  Subjective:  Joanna Hall is a 54 y.o. female, with history of untreated hepatitis c and diagnosed with recurrent stage IB1 cervical adenocarcinoma, seen in consultation for Dr. Rogue Bussing, returns to clinic today for follow-up.   Recent CT scan shows extensive, but stable lung mets.  Abdomen/pelvis fairly unremarkable.  She is not receiving treatment anymore.  She returns today for follow up and is having some pelvic pain.  So she wants to have a pelvic exam.  She is on hospice and followed by Merrily Pew Borders in palliative care and taking oxycontin and oxycodone for pain.  She is frequently seen in the cancer center for symptom management and IV hydration.   Respiratory: Positive for cough and shortness of breath.  SOB with exertion  Gastrointestinal: Positive for abdominal pain, constipation, diarrhea, nausea and vomiting.  ABD pain, bloating, discomfort, decreased appetite  Genitourinary: Positive for dysuria. Negative for flank pain and hematuria. Vulvar itching/irritation  Musculoskeletal: Positive for joint pain and myalgias.  Neurological: Positive for weakness.   Psychiatric/Behavioral: Positive for depression. The patient is nervous/anxious.    CT scan 5/21 for pain IMPRESSION: 1. No acute abdominopelvic findings. 2. Previously seen left-sided hydronephrosis has resolved. 3. Numerous nodules again identified within the bilateral lung bases, many of which are cavitary. No appreciable interval change from previous CT. 4. Previously described enhancing soft tissue in the region of the distal left ureter is not well seen on the current study given  the lack of distention of the left ureter. There may be mild urinary bladder wall thickening, slightly more pronounced on the left, similar to prior. 5. Chronic bilateral sacral insufficiency fractures, unchanged. Patchy sclerosis in the parasymphyseal left pubic bone, unchanged.    December 2017- She was diagnosed with probable recurrent stage IB1 cervical adenocarcinoma status post aborted radical hysterectomy due to infected adnexa on 2/18 at [facility] followed by chemo-radiation with apparent complete response.  September 2018- She developed bowel obstruction and had resection of terminal ileum and cecum 11/18 at [facility]. Imaging showed enlarging lung mets which were biopsy proven metastatic cervical adenocarcinoma.  She initiated carboplatin-taxol on 06/04/18 and completed 6 cycles. No avastin secondary to blood clots/bilateral PE/kidney infarct; Factor V Leiden- Xarelto (small bowel obstruction). February 2020 - bilateral lung nodules ~1cm in size. March 2020 - completed carbo-taxol #7. Carbo discontinued secondary to poor tolerance/multiple hospitalizations.   January 29, 2019- started taxol weekly x 3, one week off.   CT A/P 01/2019 showed no evidence of progressive disease in the abdomen pelvis with stable bowel thickening. Lung nodule was stable.   Given PDL-1 positivity, recommendation for [place] discussed due to progressive disease and she was treated with this for 4 cycles 7-9//20.   CT mid OCT 2020-progressive disease in the lungs;  Stopped Keytruda and treated with Alimta, but did not have response.  Gynecologic Oncology History The patient was diagnosed with cervical adenocarcinoma in [place] in 09/2016.  She has had a long standing history of abnormal PAP smears.  She presented with an ovarian cyst.  Laparoscopic surgery was difficult secondary to scar tissue.  Had BSO and rupture of cyst with purulent drainage into abdomen.  Had Stage IB1 (4 cm) endocervical adenocarcinoma.  Radical hysterectomy aborted.  She was treated by Dr Christene Slates at [facility] in [place], [place]. Decision was made to pursue concurrent chemotherapy (weekly cisplatin) and radiation.  She received treatment from 11/2016 - 05/2017.  01/2017 cisplatin x 2 and Carboplatin x 1 (01/29/17) due to ARF and XRT. XRT followed by T & O on 02/01/17 and T & N 4.28/18 and 02/20/17 . Course was complicated by weight loss (80 pounds), nausea, vomiting, electrolyte wasting (potassium and magnesium).  She describes being "sick constantly" and requiring at least 20 hospitalizations.   She underwent follow-up chest CT then PET scan in 06/2017.  She states that "the radiation worked" and there was no disease in the abdomen.  She was noted to have lung nodules that were growing.  She was scheduled to have follow-up imaging in 10/2017.  She was admitted in [place] with a small bowel obstruction.  She was managed conservatively.  She was home for about a week then traveled to [place] for Thanksgiving holiday (she has family here).  She presented on 08/27/2017 with nausea, vomiting, and lower abdominal pain.  Symptoms did not respond to conservative measurement.    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  She underwent laparotomy and right ileocolectomy on 08/31/2017 at [facility].  There were numerous pulmonary nodules c/w metastatic disease.  Surgical findings revealed a thickened, matted, and scarred piece of distal small bowel close to the ileocecal valve.  Diet was slowly advanced.  She was discharged on 09/05/2017.  Abdominal and pelvic CT without contrast on 09/11/2017 revealed debris within anterior abdominal wall incision concerning for infection, versus packing  material.   She is s/p ileocolectomy with expected postoperative changes and mild colonic ileus.  There were numerous pulmonary nodules highly concerning for metastatic disease and punctate nonobstructing nephrolithiasis. She was readmitted on 09/12/2017.  She describes the onset of lower abdominal pain on 09/09/2017.  Pain markedly increased in intensity on 09/11/2017.   Staples were removed and the wound packed.  She was started on doxycycline.  Abdomen and pelvic CT with contrast revealed postsurgical changes from ileocecectomy with primary ileocolic anastomosis without evidence of abscess or leak.  There was edema of small bowel loops at distal ileum potentially related to surgery.  There was gas within ventral midline surgical wound corresponding to wound infection versus packing material.  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  She denies any history of thrombosis.  She denies any family history of thrombosis.  She stopped smoking 10 years ago (1/4 ppd) and has a history of thoracotomy.  She denies any family history of rheumatologic problems/vasculitis/autoimmune disorders.  Thrombosis work up showed she is heterozygous for Factor V Leiden and taking Xarelto.  Patient had diarrhea despite Lomotil and Imodium with 15 watery BMs per day.  Dr. Hampton Abbot who did the bowel resection managing.  PET scan ordered to evaluate enlarging lung nodules with concern for recurrent cervical cancer. Scan not done yet due to insurance issues and she will have to get the study done in [place].   She was having chronic diarrhea postoperatively and has history of chronic hep C and was seen by surgery and GI for management.  On 12/03/2017 she presented to ER for abdominal pain and emesis.  Imaging revealed:  12/03/17- CT A/P: continued multiple pulmonary nodules inthelung bases concerning for metastatic disease. There was mild wall thickening of rectosigmoid colon is  noted.There was a stable 1.8 x 1.6 cm mixed fat and soft tissue densitynoted in right lower quadrant which may represent fat necrosis.There was a larger low density is noted involving uterine fundus.   12/03/2017:  Pelvic MRI was unremarkable.   04/15/2018:  Chest, abdomen, and pelvis CT revealed innumerable (> 100) cavitary nodules scattered in the lungs, moderately enlarging compared to the 11/08/2017 PET-CT, suspicious for metastatic disease.  One index node in the RLL measures 1.0 x 1.1 cm (previously 0.6 x 0.6 cm).  There were no new nodules.  There was an ill-defined wall thickening in the rectosigmoid with surrounding stranding along fascia planes, probably sequela from prior radiation therapy.  There was multilevel lumbar impingement due to spondylosis and degenerative disc disease.  There was heterogeneous enhancement in the uterus (some possibly from prior radiation therapy).  04/23/2018:  PET scan revealed numerous scattered solid and cavitary nodules in the lungs stable increased in size compared to the prior PET-CT from 11/08/2017. Largest nodule was 1.1 cm in the LUL (SUV 1.9).  These demonstrated low-grade metabolic activity up to a maximum SUV of about 2.3, increased from 11/08/2017.   On 04/24/2018 she underwent left total knee replacement with Dr. [name].  She is hepatitis C positive.  Hepatitis C genotype is 2a/2c.  She received B12 injections for history of B12 deficiency.   Case was discussed at tumor board on 04/25/2018 since this was to pursue CT-guided biopsy of left upper lobe pulmonary nodule which was performed on 05/06/18- Pathology: Metastatic adenocarcinoma morphologically consistent with cervical adenocarcinoma.   06/04/18 She initiated carboplatin-Taxol.    Foundation One testing was positive for PD-L1.   CT A/P w/ contrast 07/27/18 for abdominal pain/nausea/diarrhea Diffuse thickening of the walls of the entire colon, most prominent within the transverse colon and  worsened thickening of the walls of the transverse colon compared to earlier episodes, consistent with recurrent colitis of infectious or inflammatory nature, possibly radiation related. Stable small lung nodules compatible with metastatic disease.   She was admitted to hospital 12/19 for lower GI bleed.  Colonoscopy revealed 3 bleeding terminal ileal ulcers but no anastomotic ulcer.  Xarelto was held.  She presented to clinic reporting vaginal spotting and was seen in symptom management by Beckey Rutter, NP on 10/15/2018.  Exam revealed small ulceration in vaginal cuff as possible previous source of bleeding now healing.  She was also reporting dysuria at that time.  UA was consistent with UTI and she was started on Macrobid however, culture was negative.  Given pain and symptoms imaging was performed.  10/17/2018-CT a/P w/ contrast which was positive for SBO at left hemiabdomen. No discrete mass and felt to possibly reflect adhesions. Bilateral pulmonary nodules compatible with metastatic disease.   09/30/2018-CT chest w contrast 1. Interval decrease in size and number of metastatic pulmonary nodules. 2. No new/progressive findings in the chest or upper abdomen. Aortic Atherosclerosis (ICD10-I70.0) and Emphysema (ICD10-J43.9).  Problem List: Patient Active Problem List   Diagnosis Date Noted  . S/P IVC filter 02/13/2020  . Hydroureter 12/29/2019  . Transaminitis   . Right leg pain 12/28/2019  . History of total knee arthroplasty, right 12/24/2019  . Moderate episode of recurrent major depressive disorder ([place]) 11/13/2019  . Acute colitis 09/25/2019  . Anxiety and depression   . Thrombocytopenia ([place]) 08/17/2019  . Intractable lower abdominal pain   .  Abdominal pain 07/23/2019  . Constipation 07/22/2019  . Malnutrition of moderate degree 06/22/2019  . Small bowel obstruction due to adhesions ([place]) 06/21/2019  . Acute gastroenteritis 06/14/2019  . Intractable nausea and vomiting 06/07/2019   . Chest pain 05/19/2019  . Intractable pain 05/19/2019  . Cancer associated pain   . Proctitis, radiation   . Palliative care encounter   . Encounter for monitoring opioid maintenance therapy 11/19/2018  . Adrenal insufficiency ([place]) 09/03/2018  . Nausea vomiting and diarrhea   . Chronic diarrhea 06/25/2018  . Chronic anticoagulation 06/25/2018  . Vomiting   . Encounter for antineoplastic chemotherapy 06/04/2018  . 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 Lung metastasis ([place]) 05/15/2018  . Closed fracture of distal end of radius 05/04/2018  . History of total knee arthroplasty 04/24/2018  . Osteoarthritis of left knee 02/28/2018  . Condyloma acuminatum of anus s/p ablation 02/22/2018 02/22/2018  . Condyloma acuminatum of vagina s/p ablation 02/22/2018 02/22/2018  . Positive ANA (antinuclear antibody) 02/04/2018  . Aortic atherosclerosis ([place]) 01/15/2018  . Elevated MCV 01/15/2018  . Anemia 01/15/2018  . Genital warts 01/15/2018  . Vitamin D deficiency 01/15/2018  . Pernicious anemia   . Impingement syndrome of shoulder region 12/19/2017  . Multiple joint pain 12/19/2017  . Osteoarthritis of right knee 12/19/2017  . B12 deficiency 12/11/2017  . Lung nodules 12/11/2017  . History of Clostridium difficile infection 10/23/2017  . Factor V Leiden ([place]) 10/19/2017  . Goals of care, counseling/discussion 10/19/2017  . Cervical cancer, FIGO stage IB1 ([place]) 09/18/2017  . Chronic hepatitis C without hepatic coma ([place]) 09/18/2017  . Cytopenia 09/18/2017  . Diarrhea 09/18/2017  . Elevated LFTs 09/18/2017  . Lower abdominal pain 09/18/2017  . 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 Malignant neoplasm of overlapping sites of cervix ([place]) 09/18/2017  . Renal insufficiency 09/18/2017  . Wound infection after surgery 09/12/2017  . Hypokalemia   . Hypomagnesemia   . Cervical arthritis 07/18/2017  . Dysuria 06/20/2017  . Metastasis from cervical cancer ([place]) 05/12/2017  . Essential  hypertension 03/15/2017  . Anemia of chronic disease 03/01/2017  . Chemotherapy-induced neutropenia ([place]) 01/29/2017  . 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[place]) 09/25/2016    Past Medical History: Past Medical History:  Diagnosis Date  . Abdominal pain 06/10/2018  . Abnormal cervical Papanicolaou smear 09/18/2017  . Anxiety   . Aortic atherosclerosis ([place])   . Arthritis    neck and knees  . Blood clots in brain    both lungs and right kidney  . Blood transfusion without reported diagnosis   . Cervical cancer ([place]) 09/2016   mets lung  . Chronic anal fissure   . Chronic diarrhea   . Dyspnea   . Erosive gastropathy 09/18/2017  . Factor V Leiden mutation ([place])   . Fecal incontinence   . Genital warts   . GERD (gastroesophageal reflux disease)   . GI bleed 10/08/2018  . Heart murmur   . Hematochezia   . Hemorrhoids   . 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 Hepatitis, chronic ([place]) 05/05/2017  . History of cancer chemotherapy    completed 06/2017  . History of Clostridium difficile infection    while undergoing chemo.  Negative test 10/2017  . Ileocolic anastomotic leak   . Infarction of kidney ([place]) left kidney   and uterus  . Intestinal infection due to Clostridium difficile 09/18/2017  . Lung cancer ([place])   . Macrocytic anemia with vitamin  B12 deficiency   . Multiple gastric ulcers   . Nausea vomiting and diarrhea   . Pancolitis ([place]) 07/27/2018  . Perianal condylomata   . Pneumonia    History of  . Pulmonary nodules   . Rectal bleeding   . Small bowel obstruction ([place]) 08/2017  . Stiff neck    limited right turn  . Vitamin D deficiency     Past Surgical History: Past Surgical History:  Procedure Laterality Date  . CHOLECYSTECTOMY    . COLON SURGERY  08/2017   resection  . COLONOSCOPY WITH PROPOFOL N/A 12/20/2017   Procedure: COLONOSCOPY WITH PROPOFOL;  Surgeon: [name], MD;  Location: [facility] ENDOSCOPY;  Service:  Gastroenterology;  Laterality: N/A;  . COLONOSCOPY WITH PROPOFOL N/A 07/30/2018   Procedure: COLONOSCOPY WITH PROPOFOL;  Surgeon: [name], MD;  Location: [facility] ENDOSCOPY;  Service: Gastroenterology;  Laterality: N/A;  . COLONOSCOPY WITH PROPOFOL N/A 10/10/2018   Procedure: COLONOSCOPY WITH PROPOFOL;  Surgeon: Lucilla Lame, MD;  Location[facility] ENDOSCOPY;  Service: Endoscopy;  Laterality: N/A;  . DIAGNOSTIC LAPAROSCOPY    . ESOPHAGOGASTRODUODENOSCOPY (EGD) WITH PROPOFOL N/A 12/20/2017   Procedure: ESOPHAGOGASTRODUODENOSCOPY (EGD) WITH PROPOFOL;  Surgeon: [name], MD;  Location: [place];  Service: Gastroenterology;  Laterality: N/A;  . ESOPHAGOGASTRODUODENOSCOPY (EGD) WITH PROPOFOL  07/30/2018   Procedure: ESOPHAGOGASTRODUODENOSCOPY (EGD) WITH PROPOFOL;  Surgeon: [name], MD;  Location: [facility] ENDOSCOPY;  Service: Gastroenterology;;  . Otho Darner SIGMOIDOSCOPY N/A 11/21/2018   Procedure: FLEXIBLE SIGMOIDOSCOPY;  Surgeon: [name], MD;  Location: [facility] ENDOSCOPY;  Service: Gastroenterology;  Laterality: N/A;  . IVC FILTER INSERTION N/A 01/01/2020   Procedure: IVC FILTER INSERTION;  Surgeon: Algernon Huxley, MD;  Location: [place] CV LAB;  Service: Cardiovascular;  Laterality: N/A;  . IVC FILTER REMOVAL N/A 02/23/2020   Procedure: IVC FILTER REMOVAL;  Surgeon: Algernon Huxley, MD;  Location: [place] CV LAB;  Service: Cardiovascular;  Laterality: N/A;  . LAPAROTOMY N/A 08/31/2017   Procedure: EXPLORATORY LAPAROTOMY for SBO, ileocolectomy, removal of piece of uterine wall;  Surgeon: [name], MD;  Location: [facility] ORS;  Service: General;  Laterality: N/A;  . LASER ABLATION CONDOLAMATA N/A 02/22/2018   Procedure: LASER ABLATION/REMOVAL OF [id number] AROUND ANUS AND VAGINA;  Surgeon: Michael Boston, MD;  Location: [place];  Service: General;  Laterality: N/A;  . OOPHORECTOMY    . PORTA CATH INSERTION N/A 05/13/2018   Procedure: PORTA  CATH INSERTION;  Surgeon: Katha Cabal, MD;  Location: [place] CV LAB;  Service: Cardiovascular;  Laterality: N/A;  . SMALL INTESTINE SURGERY    . TANDEM RING INSERTION     x3  . THORACOTOMY    . TOTAL KNEE ARTHROPLASTY Left 04/24/2018   Procedure: TOTAL KNEE ARTHROPLASTY;  Surgeon: Lovell Sheehan, MD;  Location: [facility] ORS;  Service: Orthopedics;  Laterality: Left;  . TOTAL KNEE ARTHROPLASTY Right 12/24/2019   Procedure: RIGHT TOTAL KNEE ARTHROPLASTY;  Surgeon: Lovell Sheehan, MD;  Location: [facility] ORS;  Service: Orthopedics;  Laterality: Right;     Family History: Family History  Problem Relation Age of Onset  . Hypertension Mother   . Hypertension Father   . Diabetes Father   . Hyperlipidemia Father   . Alcohol abuse Daughter   . Hypertension Maternal Grandmother   . Diabetes Maternal Grandmother   . Diabetes Paternal Grandmother   . Hypertension Paternal Grandmother     Social History: Social History   Socioeconomic History  . Marital  status: Divorced    Spouse name: Not on file  . Number of children: Not on file  . Years of education: Not on file  . Highest education level: Not on file  Occupational History  . Not on file  Tobacco Use  . Smoking status: Former Smoker    Packs/day: 0.25    Years: 10.00    Pack years: 2.50    Types: Cigarettes    Quit date: 10/16/2006    Years since quitting: 13.4  . Smokeless tobacco: Never Used  Substance and Sexual Activity  . Alcohol use: Not Currently    Comment: seldom  . Drug use: Yes    Types: Marijuana    Comment: not very often  . Sexual activity: Not Currently    Birth control/protection: Post-menopausal    Comment: Not Asked  Other Topics Concern  . Not on file  Social History Narrative  . Not on file   Social Determinants of Health   Financial Resource Strain:   . Difficulty of Paying Living Expenses:   Food Insecurity:   . Worried About Charity fundraiser in the Last Year:   . Arboriculturist in  the Last Year:   Transportation Needs:   . Film/video editor (Medical):   Marland Kitchen Lack of Transportation (Non-Medical):   Physical Activity:   . Days of Exercise per Week:   . Minutes of Exercise per Session:   Stress:   . Feeling of Stress :   Social Connections:   . Frequency of Communication with Friends and Family:   . Frequency of Social Gatherings with Friends and Family:   . Attends Religious Services:   . Active Member of Clubs or Organizations:   . Attends Archivist Meetings:   Marland Kitchen Marital Status:   Intimate Partner Violence:   . Fear of Current or Ex-Partner:   . Emotionally Abused:   Mar[place] Kitchen Physically Abused:   . Sexually Abused:     Allergies: Allergies  Allergen Reactions  . Ketamine Anxiety and Other (See Comments)    Syncope episode/confusion     Current Medications: Current Outpatient Medications  Medication Sig Dispense Refill  . amitriptyline (ELAVIL) 100 MG tablet Take 1 tablet (100 mg total) by mouth at bedtime. 90 tablet 1  . diphenoxylate-atropine (LOMOTIL) 2.5-0.025 MG tablet TAKE (2) TABLETS FOUR TIMES DAILY. (Patient not taking: Reported on 02/23/2020) 240 tablet 0  . famotidine (PEPCID) 20 MG tablet Take 1 tablet (20 mg total) by mouth 2 (two) times daily. (Patient taking differently: Take 20 mg by mouth daily as needed for heartburn. ) 60 tablet 0  . folic acid (FOLVITE) 1 MG tablet Take 1 tablet (1 mg total) by mouth daily. 30 tablet 6  . hydrOXYzine (VISTARIL) 25 MG capsule Take 1 capsule (25 mg total) by mouth 3 (three) times daily as needed for itching. 30 capsule 0  . hyoscyamine (LEVBID) 0.375 MG 12 hr tablet Take 1 tablet (0.375 mg total) by mouth 2 (two) times daily. 60 tablet 0  . Multiple Vitamins-Minerals (SUPER THERA VITE M) TABS Take 1 tablet by mouth daily.     . naloxone (NARCAN) nasal spray 4 mg/0.1 mL 1 spray into nostril upon signs of opioid overdose. Call 911. May repeat once if no response within 2-3 minutes. 1 each 0  .  ondansetron (ZOFRAN ODT) 4 MG disintegrating tablet Take 1 tablet (4 mg total) by mouth every 6 (six) hours as needed for nausea or vomiting. Jennette  tablet 0  . pantoprazole (PROTONIX) 40 MG tablet Take 1 tablet (40 mg total) by mouth daily. 90 tablet 3  . promethazine (PHENERGAN) 25 MG tablet Take 1 tablet (25 mg total) by mouth every 8 (eight) hours as needed for nausea or vomiting. 30 tablet 0  . rivaroxaban (XARELTO) 20 MG TABS tablet Take 1 tablet (20 mg total) by mouth daily with supper. 60 tablet 1  . triamcinolone ointment (KENALOG) 0.5 % Apply 1 application topically 2 (two) times daily. (Patient not taking: Reported on 02/23/2020) 30 g 0  . VENTOLIN HFA 108 (90 Base) MCG/ACT inhaler Inhale 1-2 puffs into the lungs every 4 (four) hours as needed for shortness of breath. 1 Inhaler 1   No current facility-administered medications for this visit.   Facility-Administered Medications Ordered in Other Visits  Medication Dose Route Frequency Provider Last Rate Last Admin  . heparin lock flush 100 unit/mL  500 Units Intravenous Once Corcoran, Melissa C, MD      . sodium chloride 0.9 % 1,000 mL with potassium chloride 20 mEq infusion   Intravenous Once Honor Loh E, NP      . sodium chloride flush (NS) 0.9 % injection 10 mL  10 mL Intravenous Once Borders, Kirt Boys, NP       Review of Systems General:  Fatigue, weakness Skin: no complaints Eyes: no complaints HEENT: no complaints Breasts: no complaints Pulmonary: shortness of breath Cardiac: no complaints Gastrointestinal: abdominal bloating, distention, nausea, chronic diarrhea Genitourinary/Sexual: dysuria Ob/Gyn: no complaints Musculoskeletal: chronic back pain Hematology: no complaints Neurologic/Psych: depression  Objective:  Physical Examination:  BP (!) 122/91   Pulse (!) 131   Temp (!) 96.8 F (36 C)   Ht 5' 8"  (1.727 m)   Wt 147 lb 6.4 oz (66.9 kg)   BMI 22.41 kg/m    ECOG Performance Status: 1 - Symptomatic but  completely ambulatory  GENERAL: Patient is chronically ill appearing female in no acute distress HEENT:  Sclera clear. Anicteric NODES:  Negative axillary, supraclavicular, inguinal lymph node survery LUNGS:  Clear to auscultation bilaterally.   HEART:  Regular rate and rhythm.  ABDOMEN:  Soft, nontender. No masses or ascites. Mild tendernes EXTREMITIES:  No peripheral edema. Atraumatic. No cyanosis SKIN:  Clear with no obvious rashes or skin changes.  NEURO:  Nonfocal. Well oriented.  Appropriate affect.  Pelvic: exam chaperoned by nurse; Vulva normal, Vagina somewhat shortened with radiation effect. Narrow speculum tolerated well.  Atrophic, mucosa pale, no lesions. Cervix: flush with vault, no lesions. Uterus: not palpable. Bimanual/RV:normal  No masses or nodularity.     Assessment:  Joanna Hall is a 54 y.o. female diagnosed with recurrent stage IB1 cervical adenocarcinoma (PDL1 positive) s/p aborted radical hysterectomy due to infected adnexa 2/18 at [facility] followed by chemo-radiation at [facility] with apparent complete response.  She developed a bowel obstruction and had resection of terminal ileum and cecum 11/18 at [facility].  Imaging from 04/2018 revealed lung metastases. She initiated carboplatin-taxol on 06/04/18. Course complicated by chronic diarrhea and multiple GI issues. Single agent weekly taxol and then Keytruda with progression in the lungs and then failed Alimta treatment.  Presently on hospice and pain management being done by [facility] in the cancer center.  No evidence of pelvic disease on exam today.   Medical co-morbidities complicating care:untreated Hepatitis C .  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  Also will continue symptom management with palliative care.  We can see her back for follow up in 6  months.  Spent 25 minutes with the patient today.  Mellody Drown, MD   CC: Dr. Rogue Bussing, [facility]

## 2020-03-10 NOTE — Progress Notes (Signed)
[place]     Name of the patient: Joanna Hall  [id number]  [birth year]   Date of visit: 03/10/2020  Diagnosis: cervical cancer metastatic  Chief Complaint: Abdominal Pain, nausea, vomiting  Treatment: None currently   Oncology History:  Oncology History Overview Note  # dec 2017- CERVICAL ADENO CA [[place]]; Stage IB [Dr. Christene Slates at [place] center in [place], [place];No surgery- Chemo-RT;   # 2018-sep - lung nodules [in [place]]  # AUG 20th, 2019-#1 Carbo-Taxol s/p 6 cycles- [No avastin sec to blood clots]: Feb 2020-bilateral lung nodules with 1 cm in size. March 2020- finished carbo-taxol #7; poor tolerance to [place].  # April 15 th 2020- Start Taxol weekly x 3; one week OFF;  # July 2020-CT- bil cavitary lesions- slightly bigger by few mm/overall STABLE; CT a/p-NED. 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  She received treatment from 11/2016-05/2017.  01/2017 cisplatin x 2 and carboplatin x 1 (01/29/2017) due to ARF and XRT.  XRT was followed by T & O on 02/01/2017 and T & N 02/10/2017 and 02/20/2017.  Course was complicated by 80 pound weight loss, nausea, vomiting, electrolyte wasting (potassium and magnesium).  She describes that.  Is been sick constantly requiring at least 20 hospitalizations.  Follow-up CT chest and PET on 06/2017. Per patient, 'radiation worked' and no disease in the abdomen.   At that time she was noted to have lung nodules that were growing and follow-up imaging was scheduled for 10/2017.  She was admitted to hospital in [place] for small bowel obstruction which was managed conservatively and she was home for a week prior to traveling to [place] for Thanksgiving holiday where she has family.  She presented to ER in [place] on 08/2017 with nausea, vomiting, and lower abdominal pain.  Symptoms did not respond to conservative treatment.   CT on 08/26/2017 revealed small bowel obstruction with transition in the pelvis just superior to the uterus rather was a long segment of distal ileum with fatty wall thickening compatible with chronic inflammation and/or radiation enteritis. Imaging showed numerous pulmonary nodules consistent with metastatic disease. She underwent laparotomy and right ileocolectomy on 08/31/2017 at [facility].  Surgical findings revealed a thickened, matted, and scarred piece of distal small bowel close to the ileocecal valve.  She was discharged on 09/05/2017.  Pain markedly increased in intensity and imaging was performed on 09/11/2017 which revealed: Debris within the anterior abdominal wall incision concerning for infection versus packing material, s/p post ileo-colectomy with expected postoperative changes, mild colonic ileus, numerous pulmonary nodules highly concerning for metastatic disease, punctate nonobstructing nephrolithiasis.  Staples were removed and one was packed.  She was started on doxycycline.  Abdominal and pelvic CT without contrast on 09/11/2017 revealed debris within anterior abdominal wall incision concerning for infection, versus packing material.   She is s/p ileocolectomy with expected postoperative changes and mild colonic ileus.  There were numerous pulmonary nodules highly concerning for metastatic disease and  punctate nonobstructing nephrolithiasis. She was readmitted on 09/12/2017.  She describes the onset of lower  abdominal pain on 09/09/2017.  Pain markedly increased in intensity on 09/11/2017.   Staples were removed and the wound packed.  She was started on doxycycline.  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   PET scan was ordered to evaluate enlarging lung nodules with concern for recurrent cervical cancer but scan was delayed due to insurance and need to be performed in [place].  Presented to ER on 12/03/2017 for abdominal pain and emesis.  Imaging concerning for worsening possible uterine infarct and she was admitted to hospital.  Pelvic MRI was unremarkable.  Remote scarring type changes of uterus thought to be possibly related to radiation.  She was discharged on 12/08/2017.  Underwent endoscopy and colonoscopy on 12/20/2017.    On 02/22/2018 she underwent laser ablation of condylomata around the anus and vagina under anesthesia with Dr. Johney Maine.   04/15/2018:  Chest, abdomen, and pelvis CT revealed innumerable (> 100) cavitary nodules scattered in the lungs, moderately enlarging compared to the 11/08/2017 PET-CT, suspicious for metastatic disease.  One index node in the RLL measures 1.0 x 1.1 cm (previously 0.6 x 0.6 cm).  There were no new nodules.  There was an ill-defined wall thickening in the rectosigmoid with surrounding stranding along fascia planes, probably sequela from prior radiation therapy.  There was multilevel lumbar impingement due to spondylosis and degenerative disc disease.  There was heterogeneous enhancement in the uterus (some possibly from prior radiation therapy).   04/23/2018:  PET scan revealed  numerous scattered solid and cavitary nodules in the lungs stable increased in size compared to the prior PET-CT from 11/08/2017. Largest nodule was 1.1 cm in the LUL (SUV 1.9).  These demonstrated low-grade metabolic activity up to a maximum SUV of about 2.3, increased from 11/08/2017.    Case was discussed at tumor board on 04/25/2018. Consensus to pursue CT-guided biopsy (05/06/18) which revealed: Metastatic adenocarcinoma, morphologically consistent with cervical adenocarcinoma.  She has history of chronic hepatitis C which is managed by GI.  Hepatitis C genotype is 2a/2c.  She receives B12 injections for history of B12 deficiency.  On 04/24/2018 she underwent left total knee replacement with Dr. [name]. --------------------------------------------------------   DIAGNOSIS: Cervical adenocarcinoma  STAGE: IV        ;GOALS: Palliative  CURRENT/MOST RECENT THERAPY : Alimta [C]    Malignant neoplasm of overlapping sites of cervix ([place])  01/29/2019 - 04/23/2019 Chemotherapy   The patient had PACLitaxel (TAXOL) 156 mg in sodium chloride 0.9 % 250 mL chemo infusion (</= 44m/m2), 80 mg/m2 = 156 mg, Intravenous,  Once, 3 of 4 cycles Dose modification: 65 mg/m2 (original dose 80 mg/m2, Cycle 1, Reason: Provider Judgment) Administration: 156 mg (01/29/2019), 156 mg (02/05/2019), 126 mg (02/12/2019), 126 mg (02/26/2019), 126 mg (03/13/2019), 126 mg (03/27/2019), 126 mg (03/20/2019), 126 mg (04/03/2019), 126 mg (04/10/2019)  for chemotherapy treatment.    05/08/2019 - 07/30/2019 Chemotherapy   The patient had pembrolizumab (KEYTRUDA) 200 mg in sodium chloride 0.9 % 50 mL chemo infusion, 200 mg, Intravenous, Once, 4 of 6 cycles Administration: 200 mg (05/08/2019), 200 mg (  05/29/2019), 200 mg (06/19/2019), 200 mg (07/10/2019)  for chemotherapy treatment.    08/07/2019 -  Chemotherapy   The patient had pegfilgrastim-cbqv [facility]) injection 6 mg, 6 mg, Subcutaneous, Once, 3 of 5 cycles Administration: 6 mg (08/29/2019),  6 mg (09/19/2019) PEMEtrexed (ALIMTA) 1,000 mg in sodium chloride 0.9 % 100 mL chemo infusion, 950 mg, Intravenous,  Once, 4 of 6 cycles Administration: 1,000 mg (08/07/2019), 1,000 mg (09/18/2019), 1,000 mg (11/13/2019), 1,000 mg (08/28/2019)  for chemotherapy treatment.      Subjective Data:  ECOG: 1 - Symptomatic but completely ambulatory    HPI: Recurrent stage IB1 cervical cancer s/p chemoradiation with ileocolectomy for bowel obstruction and recurrence. Now with progressive disease in lungs despite additional systemic therapy. Requiring multiple admissions and ED visits for symptom control.   She presented to the ED on with complaints of increased pain, nausea and vomiting. She reports "feeling somewhat better". CT scan completed...   She is currently not receiving chemo/radiation treatment.   Currently undergoing care with Palliative Medicine. She report good pain control with her pain medications from [employer], NP-C.    Review of Systems  Constitutional: Negative for chills and fever.  Respiratory: Positive for cough and shortness of breath.        SOB with exertion  Cardiovascular: Negative for chest pain.  Gastrointestinal: Positive for abdominal pain, constipation, diarrhea, nausea and vomiting.       ABD pain, bloating, discomfort, decreased appetite  Genitourinary: Positive for dysuria. Negative for flank pain and hematuria.       Vulvar itching/irritation  Musculoskeletal: Positive for joint pain and myalgias.  Neurological: Positive for weakness. Negative for speech change.  Psychiatric/Behavioral: Positive for depression. The patient is nervous/anxious.        Past Medical History:  Diagnosis Date  . Abdominal pain 06/10/2018  . Abnormal cervical Papanicolaou smear 09/18/2017  . Anxiety   . Aortic atherosclerosis ([place])   . Arthritis    neck and knees  . Blood clots in brain    both lungs and right kidney  . Blood transfusion without reported diagnosis    . Cervical cancer ([place]) 09/2016   mets lung  . Chronic anal fissure   . Chronic diarrhea   . Dyspnea   . Erosive gastropathy 09/18/2017  . Factor V Leiden mutation ([place])   . Fecal incontinence   . Genital warts   . GERD (gastroesophageal reflux disease)   . GI bleed 10/08/2018  . Heart murmur   . Hematochezia   . Hemorrhoids   . 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 Hepatitis, chronic ([place]) 05/05/2017  . History of cancer chemotherapy    completed 06/2017  . History of Clostridium difficile infection    while undergoing chemo.  Negative test 10/2017  . Ileocolic anastomotic leak   . 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 Intestinal infection due to Clostridium difficile 09/18/2017  . Lung cancer ([place])   . Macrocytic anemia with vitamin B12 deficiency   . Multiple gastric ulcers   . Nausea vomiting and diarrhea   . Pancolitis ([place]) 07/27/2018  . Perianal condylomata   . Pneumonia    History of  . Pulmonary nodules   . Rectal bleeding   . Small bowel obstruction ([place]) 08/2017  . Stiff neck    limited right turn  . Vitamin D deficiency    Past Surgical History:  Procedure Laterality Date  .  CHOLECYSTECTOMY    . COLON SURGERY  08/2017   resection  . COLONOSCOPY WITH PROPOFOL N/A 12/20/2017   Procedure: COLONOSCOPY WITH PROPOFOL;  Surgeon: [name], MD;  Location: [facility] ENDOSCOPY;  Service: Gastroenterology;  Laterality: N/A;  . COLONOSCOPY WITH PROPOFOL N/A 07/30/2018   Procedure: COLONOSCOPY WITH PROPOFOL;  Surgeon: [name], MD;  Location: [facility] ENDOSCOPY;  Service: Gastroenterology;  Laterality: N/A;  . COLONOSCOPY WITH PROPOFOL N/A 10/10/2018   Procedure: COLONOSCOPY WITH PROPOFOL;  Surgeon: Lucilla Lame, MD;  Location: [facility] ENDOSCOPY;  Service: Endoscopy;  Laterality: N/A;  . DIAGNOSTIC LAPAROSCOPY    . ESOPHAGOGASTRODUODENOSCOPY (EGD) WITH PROPOFOL N/A 12/20/2017   Procedure: ESOPHAGOGASTRODUODENOSCOPY  (EGD) WITH PROPOFOL;  Surgeon: [name], MD;  Location: [place];  Service: Gastroenterology;  Laterality: N/A;  . ESOPHAGOGASTRODUODENOSCOPY (EGD) WITH PROPOFOL  07/30/2018   Procedure: ESOPHAGOGASTRODUODENOSCOPY (EGD) WITH PROPOFOL;  Surgeon: [name], MD;  Location: [facility] ENDOSCOPY;  Service: Gastroenterology;;  . Otho Darner SIGMOIDOSCOPY N/A 11/21/2018   Procedure: FLEXIBLE SIGMOIDOSCOPY;  Surgeon: [name], MD;  Location: [facility] ENDOSCOPY;  Service: Gastroenterology;  Laterality: N/A;  . IVC FILTER INSERTION N/A 01/01/2020   Procedure: IVC FILTER INSERTION;  Surgeon: Algernon Huxley, MD;  Location: [place] CV LAB;  Service: Cardiovascular;  Laterality: N/A;  . IVC FILTER REMOVAL N/A 02/23/2020   Procedure: IVC FILTER REMOVAL;  Surgeon: Algernon Huxley, MD;  Location: [place] CV LAB;  Service: Cardiovascular;  Laterality: N/A;  . LAPAROTOMY N/A 08/31/2017   Procedure: EXPLORATORY LAPAROTOMY for SBO, ileocolectomy, removal of piece of uterine wall;  Surgeon: [name], MD;  Location: [facility] ORS;  Service: General;  Laterality: N/A;  . LASER ABLATION CONDOLAMATA N/A 02/22/2018   Procedure: LASER ABLATION/REMOVAL OF IEPPIRJJOAC AROUND ANUS AND VAGINA;  Surgeon: Michael Boston, MD;  Location: [place];  Service: General;  Laterality: N/A;  . OOPHORECTOMY    . PORTA CATH INSERTION N/A 05/13/2018   Procedure: PORTA CATH INSERTION;  Surgeon: Katha Cabal, MD;  Location: [place] CV LAB;  Service: Cardiovascular;  Laterality: N/A;  . SMALL INTESTINE SURGERY    . TANDEM RING INSERTION     x3  . THORACOTOMY    . TOTAL KNEE ARTHROPLASTY Left 04/24/2018   Procedure: TOTAL KNEE ARTHROPLASTY;  Surgeon: Lovell Sheehan, MD;  Location: [facility] ORS;  Service: Orthopedics;  Laterality: Left;  . TOTAL KNEE ARTHROPLASTY Right 12/24/2019   Procedure: RIGHT TOTAL KNEE ARTHROPLASTY;  Surgeon: Lovell Sheehan, MD;  Location: [facility] ORS;  Service:  Orthopedics;  Laterality: Right;    Allergies  Allergen Reactions  . Ketamine Anxiety and Other (See Comments)    Syncope episode/confusion     Current Outpatient Medications  Medication Instructions  . amitriptyline (ELAVIL) 100 mg, Oral, Daily at bedtime  . diphenoxylate-atropine (LOMOTIL) 2.5-0.025 MG tablet TAKE (2) TABLETS FOUR TIMES DAILY.  . famotidine (PEPCID) 20 mg, Oral, 2 times daily  . folic acid (FOLVITE) 1 mg, Oral, Daily  . hydrOXYzine (VISTARIL) 25 mg, Oral, 3 times daily PRN  . hyoscyamine (LEVBID) 0.375 MG 12 hr tablet Take 1 tablet (0.375 mg total) by mouth 2 (two) times daily.  . Multiple Vitamins-Minerals (SUPER THERA VITE M) TABS 1 tablet, Oral, Daily  . naloxone (NARCAN) nasal spray 4 mg/0.1 mL 1 spray into nostril upon signs of opioid overdose. Call 911. May repeat once if no response within 2-3 minutes.  . ondansetron (ZOFRAN ODT) 4 mg, Oral, Every 6 hours PRN  .  pantoprazole (PROTONIX) 40 mg, Oral, Daily  . promethazine (PHENERGAN) 25 MG tablet Take 1 tablet (25 mg total) by mouth every 8 (eight) hours as needed for nausea or vomiting.  . rivaroxaban (XARELTO) 20 mg, Oral, Daily with supper  . triamcinolone ointment (KENALOG) 0.5 % 1 application, Topical, 2 times daily  . VENTOLIN HFA 108 (90 Base) MCG/ACT inhaler 1-2 puffs, Inhalation, Every 4 hours PRN      Objective:  BP (!) 122/91   Pulse (!) 131   Temp (!) 96.8 F (36 C)   Ht 5' 8"  (1.727 m)   Wt 66.9 kg   BMI 22.41 kg/m   Physical Exam Exam conducted with a chaperone present.  Constitutional:      Appearance: Normal appearance. She is well-developed and normal weight.  HENT:     Head: Normocephalic and atraumatic.     Nose: Nose normal.     Mouth/Throat:     Mouth: Mucous membranes are moist.     Pharynx: Oropharynx is clear.  Eyes:     Pupils: Pupils are equal, round, and reactive to light.  Neck:     Trachea: Trachea normal.  Cardiovascular:     Rate and Rhythm: Regular rhythm.  Tachycardia present.     Pulses: Normal pulses.  Pulmonary:     Effort: Pulmonary effort is normal.  Abdominal:     General: Abdomen is flat.     Palpations: Abdomen is soft.     Tenderness: There is abdominal tenderness in the left upper quadrant.    Genitourinary:    General: Normal vulva.     Exam position: Supine.     Labia:        Right: No rash or tenderness.        Left: No rash or tenderness.      Urethra: No prolapse, urethral pain or urethral swelling.  Musculoskeletal:        General: Tenderness present.     Right hand: Swelling and deformity present.     Left hand: Swelling and deformity present.     Cervical back: Normal range of motion and neck supple.     Right knee: Swelling present.     Left knee: Swelling present.     Right lower leg: Edema present.     Left lower leg: Edema present.  Lymphadenopathy:     Cervical:     Right cervical: No superficial cervical adenopathy.    Left cervical: No superficial cervical adenopathy.     Upper Body:     Right upper body: No supraclavicular adenopathy.     Left upper body: No supraclavicular adenopathy.     Lower Body: No right inguinal adenopathy. No left inguinal adenopathy.  Skin:    General: Skin is warm and dry.  Neurological:     General: No focal deficit present.     Mental Status: She is alert.     Motor: Weakness present.     Gait: Gait is intact.  Psychiatric:        Attention and Perception: Attention normal.        Mood and Affect: Mood and affect normal.        Speech: Speech normal.        Behavior: Behavior normal. Behavior is cooperative.        Thought Content: Thought content normal.        Judgment: Judgment normal.    Pelvic exam: normal external genitalia, vulva. VAGINA: atrophic and foreshortened, no  discharge, no lesions. CERVIX: Flush with upper vault, no lesions. BIMANUAL: Woody induration, post-radiation, no masses. RECTAL: Confirmatory.        Assessment & Plan:   METASTATIC  CERVICAL CANCER, with extensive Lung Mets. Not currently receiving active therapy.  -no progression of disease in pelvis based on exam -continue management with Palliative Care medicine -we will schedule a follow up for 6 months from now, call if you need to be seen sooner       Thank you for allowing me to participate in your care!   [place], Roslyn Student

## 2020-03-11 ENCOUNTER — Inpatient Hospital Stay
Admission: EM | Admit: 2020-03-11 | Discharge: 2020-03-13 | DRG: 948 | Disposition: A | Discharge status: Hospice | Payer: Medicaid Other | Attending: Hospitalist | Admitting: Hospitalist

## 2020-03-11 ENCOUNTER — Encounter: Payer: Self-pay | Admitting: Internal Medicine

## 2020-03-11 ENCOUNTER — Emergency Department: Payer: Medicaid Other

## 2020-03-11 ENCOUNTER — Other Ambulatory Visit: Payer: Self-pay

## 2020-03-11 ENCOUNTER — Encounter: Payer: Self-pay | Admitting: Emergency Medicine

## 2020-03-11 DIAGNOSIS — R109 Unspecified abdominal pain: Secondary | ICD-10-CM

## 2020-03-11 DIAGNOSIS — Z9049 Acquired absence of other specified parts of digestive tract: Secondary | ICD-10-CM | POA: Diagnosis not present

## 2020-03-11 DIAGNOSIS — F329 Major depressive disorder, single episode, unspecified: Secondary | ICD-10-CM | POA: Diagnosis present

## 2020-03-11 DIAGNOSIS — F419 Anxiety disorder, unspecified: Secondary | ICD-10-CM | POA: Diagnosis present

## 2020-03-11 DIAGNOSIS — B182 Chronic viral hepatitis C: Secondary | ICD-10-CM | POA: Diagnosis present

## 2020-03-11 DIAGNOSIS — Z95828 Presence of other vascular implants and grafts: Secondary | ICD-10-CM | POA: Diagnosis not present

## 2020-03-11 DIAGNOSIS — K601 Chronic anal fissure: Secondary | ICD-10-CM | POA: Diagnosis present

## 2020-03-11 DIAGNOSIS — Z96653 Presence of artificial knee joint, bilateral: Secondary | ICD-10-CM | POA: Diagnosis present

## 2020-03-11 DIAGNOSIS — K219 Gastro-esophageal reflux disease without esophagitis: Secondary | ICD-10-CM | POA: Diagnosis present

## 2020-03-11 DIAGNOSIS — Z20822 Contact with and (suspected) exposure to covid-19: Secondary | ICD-10-CM | POA: Diagnosis present

## 2020-03-11 DIAGNOSIS — Z9221 Personal history of antineoplastic chemotherapy: Secondary | ICD-10-CM

## 2020-03-11 DIAGNOSIS — D6851 Activated protein C resistance: Secondary | ICD-10-CM | POA: Diagnosis present

## 2020-03-11 DIAGNOSIS — C539 Malignant neoplasm of cervix uteri, unspecified: Secondary | ICD-10-CM

## 2020-03-11 DIAGNOSIS — Z90722 Acquired absence of ovaries, bilateral: Secondary | ICD-10-CM

## 2020-03-11 DIAGNOSIS — Z515 Encounter for palliative care: Secondary | ICD-10-CM | POA: Diagnosis not present

## 2020-03-11 DIAGNOSIS — I700 Atherosclerosis of aorta: Secondary | ICD-10-CM | POA: Diagnosis present

## 2020-03-11 DIAGNOSIS — C7800 Secondary malignant neoplasm of unspecified lung: Secondary | ICD-10-CM | POA: Diagnosis present

## 2020-03-11 DIAGNOSIS — Z8249 Family history of ischemic heart disease and other diseases of the circulatory system: Secondary | ICD-10-CM

## 2020-03-11 DIAGNOSIS — R54 Age-related physical debility: Secondary | ICD-10-CM | POA: Diagnosis present

## 2020-03-11 DIAGNOSIS — G893 Neoplasm related pain (acute) (chronic): Secondary | ICD-10-CM | POA: Diagnosis present

## 2020-03-11 DIAGNOSIS — Z87891 Personal history of nicotine dependence: Secondary | ICD-10-CM

## 2020-03-11 DIAGNOSIS — Z86711 Personal history of pulmonary embolism: Secondary | ICD-10-CM

## 2020-03-11 DIAGNOSIS — Z79899 Other long term (current) drug therapy: Secondary | ICD-10-CM

## 2020-03-11 DIAGNOSIS — E2740 Unspecified adrenocortical insufficiency: Secondary | ICD-10-CM | POA: Diagnosis present

## 2020-03-11 DIAGNOSIS — Z86718 Personal history of other venous thrombosis and embolism: Secondary | ICD-10-CM | POA: Diagnosis not present

## 2020-03-11 DIAGNOSIS — K529 Noninfective gastroenteritis and colitis, unspecified: Secondary | ICD-10-CM | POA: Diagnosis present

## 2020-03-11 DIAGNOSIS — Z7901 Long term (current) use of anticoagulants: Secondary | ICD-10-CM

## 2020-03-11 DIAGNOSIS — F1290 Cannabis use, unspecified, uncomplicated: Secondary | ICD-10-CM | POA: Diagnosis present

## 2020-03-11 DIAGNOSIS — K56609 Unspecified intestinal obstruction, unspecified as to partial versus complete obstruction: Secondary | ICD-10-CM | POA: Diagnosis present

## 2020-03-11 DIAGNOSIS — Z85118 Personal history of other malignant neoplasm of bronchus and lung: Secondary | ICD-10-CM | POA: Diagnosis not present

## 2020-03-11 DIAGNOSIS — Z923 Personal history of irradiation: Secondary | ICD-10-CM

## 2020-03-11 DIAGNOSIS — Z79891 Long term (current) use of opiate analgesic: Secondary | ICD-10-CM

## 2020-03-11 DIAGNOSIS — E876 Hypokalemia: Secondary | ICD-10-CM | POA: Diagnosis present

## 2020-03-11 DIAGNOSIS — R1084 Generalized abdominal pain: Secondary | ICD-10-CM

## 2020-03-11 DIAGNOSIS — Z66 Do not resuscitate: Secondary | ICD-10-CM | POA: Diagnosis present

## 2020-03-11 DIAGNOSIS — R1110 Vomiting, unspecified: Secondary | ICD-10-CM

## 2020-03-11 LAB — URINALYSIS, COMPLETE (UACMP) WITH MICROSCOPIC
Bacteria, UA: NONE SEEN
Bilirubin Urine: NEGATIVE
Glucose, UA: NEGATIVE mg/dL
Hgb urine dipstick: NEGATIVE
Ketones, ur: NEGATIVE mg/dL
Nitrite: NEGATIVE
Protein, ur: NEGATIVE mg/dL
Specific Gravity, Urine: >1.046 — ABNORMAL HIGH (ref 1.005–1.030)
pH: 7 (ref 5.0–8.0)

## 2020-03-11 LAB — COMPREHENSIVE METABOLIC PANEL
ALT: 58 U/L — ABNORMAL HIGH (ref 0–44)
AST: 69 U/L — ABNORMAL HIGH (ref 15–41)
Albumin: 3.4 g/dL — ABNORMAL LOW (ref 3.5–5.0)
Alkaline Phosphatase: 260 U/L — ABNORMAL HIGH (ref 38–126)
Anion gap: 9 (ref 5–15)
BUN: 9 mg/dL (ref 6–20)
CO2: 25 mmol/L (ref 22–32)
Calcium: 8.9 mg/dL (ref 8.9–10.3)
Chloride: 102 mmol/L (ref 98–111)
Creatinine, Ser: 0.54 mg/dL (ref 0.44–1.00)
GFR calc Af Amer: >60 mL/min (ref >60)
GFR calc non Af Amer: >60 mL/min (ref >60)
Glucose, Bld: 110 mg/dL — ABNORMAL HIGH (ref 70–99)
Potassium: 3.3 mmol/L — ABNORMAL LOW (ref 3.5–5.1)
Sodium: 136 mmol/L (ref 135–145)
Total Bilirubin: 0.7 mg/dL (ref 0.3–1.2)
Total Protein: 7.1 g/dL (ref 6.5–8.1)

## 2020-03-11 LAB — SARS CORONAVIRUS 2 BY RT PCR (HOSPITAL ORDER, PERFORMED IN ~~LOC~~ HOSPITAL LAB): SARS Coronavirus 2: NEGATIVE

## 2020-03-11 LAB — LIPASE, BLOOD: Lipase: 25 U/L (ref 11–51)

## 2020-03-11 LAB — CBC
HCT: 37.4 % (ref 36.0–46.0)
Hemoglobin: 12.2 g/dL (ref 12.0–15.0)
MCH: 30.9 pg (ref 26.0–34.0)
MCHC: 32.6 g/dL (ref 30.0–36.0)
MCV: 94.7 fL (ref 80.0–100.0)
Platelets: 198 10*3/uL (ref 150–400)
RBC: 3.95 MIL/uL (ref 3.87–5.11)
RDW: 14.6 % (ref 11.5–15.5)
WBC: 5.9 10*3/uL (ref 4.0–10.5)
nRBC: 0 % (ref 0.0–0.2)

## 2020-03-11 LAB — MISC LABCORP TEST (SEND OUT): Labcorp test code: 791194

## 2020-03-11 LAB — TROPONIN I (HIGH SENSITIVITY): Troponin I (High Sensitivity): 9 ng/L (ref <18)

## 2020-03-11 MED ORDER — ALUM & MAG HYDROXIDE-SIMETH 200-200-20 MG/5ML PO SUSP: 15.0000 mL | Freq: Four times a day (QID) | ORAL | Status: DC | PRN

## 2020-03-11 MED ORDER — HYDROMORPHONE HCL 1 MG/ML IJ SOLN
1.0000 mg | Freq: Once | INTRAMUSCULAR | Status: AC
  Filled 2020-03-11: qty 1

## 2020-03-11 MED ORDER — POTASSIUM CHLORIDE CRYS ER 20 MEQ PO TBCR
40.0000 meq | EXTENDED_RELEASE_TABLET | Freq: Once | ORAL | Status: AC
  Administered 2020-03-11: 21:00:00 40 meq via ORAL
  Filled 2020-03-11: qty 2

## 2020-03-11 MED ORDER — HYDROMORPHONE HCL 1 MG/ML IJ SOLN
1.0000 mg | INTRAMUSCULAR | Status: AC | PRN
  Administered 2020-03-11 (×2): 1 mg via INTRAVENOUS
  Filled 2020-03-11: qty 1

## 2020-03-11 MED ORDER — ONDANSETRON 4 MG PO TBDP
4.0000 mg | ORAL_TABLET | Freq: Three times a day (TID) | ORAL | Status: DC | PRN
  Administered 2020-03-11: 21:00:00 4 mg via ORAL
  Filled 2020-03-11 (×2): qty 1

## 2020-03-11 MED ORDER — HYDROMORPHONE HCL 1 MG/ML IJ SOLN
INTRAMUSCULAR | Status: AC
  Administered 2020-03-11: 1 mg via INTRAVENOUS
  Filled 2020-03-11: qty 1

## 2020-03-11 MED ORDER — ONDANSETRON HCL 4 MG/2ML IJ SOLN
4.0000 mg | Freq: Four times a day (QID) | INTRAMUSCULAR | Status: DC | PRN
  Administered 2020-03-11 – 2020-03-13 (×5): 4 mg via INTRAVENOUS
  Filled 2020-03-11 (×5): qty 2

## 2020-03-11 MED ORDER — HYDROMORPHONE HCL 1 MG/ML IJ SOLN
1.0000 mg | INTRAMUSCULAR | Status: DC | PRN
  Administered 2020-03-11 – 2020-03-12 (×5): 1 mg via INTRAVENOUS
  Filled 2020-03-11 (×5): qty 1

## 2020-03-11 MED ORDER — PANTOPRAZOLE SODIUM 40 MG PO TBEC
40.0000 mg | DELAYED_RELEASE_TABLET | Freq: Every day | ORAL | Status: DC
  Administered 2020-03-12 – 2020-03-13 (×2): 40 mg via ORAL
  Filled 2020-03-11 (×2): qty 1

## 2020-03-11 MED ORDER — GUAIFENESIN-DM 100-10 MG/5ML PO SYRP
10.0000 mL | ORAL_SOLUTION | Freq: Four times a day (QID) | ORAL | Status: DC | PRN
  Filled 2020-03-11: qty 10

## 2020-03-11 MED ORDER — POLYETHYLENE GLYCOL 3350 17 G PO PACK: 17.0000 g | PACK | Freq: Two times a day (BID) | ORAL | Status: DC | PRN

## 2020-03-11 MED ORDER — HYOSCYAMINE SULFATE ER 0.375 MG PO TB12
0.3750 mg | ORAL_TABLET | Freq: Two times a day (BID) | ORAL | Status: DC
  Administered 2020-03-11 – 2020-03-13 (×4): 0.375 mg via ORAL
  Filled 2020-03-11 (×5): qty 1

## 2020-03-11 MED ORDER — RIVAROXABAN 20 MG PO TABS
20.0000 mg | ORAL_TABLET | Freq: Every day | ORAL | Status: DC
  Administered 2020-03-11 – 2020-03-12 (×2): 20 mg via ORAL
  Filled 2020-03-11 (×4): qty 1

## 2020-03-11 MED ORDER — CALCIUM CARBONATE ANTACID 500 MG PO CHEW: 1.0000 | CHEWABLE_TABLET | Freq: Three times a day (TID) | ORAL | Status: DC | PRN

## 2020-03-11 MED ORDER — IOHEXOL 300 MG/ML  SOLN
75.0000 mL | Freq: Once | INTRAMUSCULAR | Status: AC | PRN
  Administered 2020-03-11: 75 mL via INTRAVENOUS
  Filled 2020-03-11: qty 75

## 2020-03-11 MED ORDER — ONDANSETRON 4 MG PO TBDP
4.0000 mg | ORAL_TABLET | Freq: Once | ORAL | Status: AC | PRN
  Administered 2020-03-11: 4 mg via ORAL
  Filled 2020-03-11: qty 1

## 2020-03-11 MED ORDER — PROMETHAZINE HCL 25 MG/ML IJ SOLN
12.5000 mg | Freq: Four times a day (QID) | INTRAMUSCULAR | Status: DC | PRN
  Administered 2020-03-12 – 2020-03-13 (×5): 12.5 mg via INTRAVENOUS
  Filled 2020-03-11 (×5): qty 1

## 2020-03-11 MED ORDER — FENTANYL CITRATE (PF) 100 MCG/2ML IJ SOLN
50.0000 ug | INTRAMUSCULAR | Status: DC | PRN
  Administered 2020-03-11: 50 ug via NASAL
  Filled 2020-03-11: qty 2

## 2020-03-11 MED ORDER — OXYCODONE HCL ER 10 MG PO T12A
30.0000 mg | EXTENDED_RELEASE_TABLET | Freq: Three times a day (TID) | ORAL | Status: DC
  Administered 2020-03-11 – 2020-03-12 (×2): 30 mg via ORAL
  Filled 2020-03-11 (×2): qty 3

## 2020-03-11 MED ORDER — AMITRIPTYLINE HCL 25 MG PO TABS
100.0000 mg | ORAL_TABLET | Freq: Every day | ORAL | Status: DC
  Administered 2020-03-11 – 2020-03-12 (×2): 100 mg via ORAL
  Filled 2020-03-11 (×2): qty 4

## 2020-03-11 MED ORDER — PROMETHAZINE HCL 25 MG/ML IJ SOLN
12.5000 mg | Freq: Once | INTRAMUSCULAR | Status: AC
  Administered 2020-03-11: 12.5 mg via INTRAVENOUS

## 2020-03-11 MED ORDER — HYDROXYZINE PAMOATE 25 MG PO CAPS
25.0000 mg | ORAL_CAPSULE | Freq: Three times a day (TID) | ORAL | Status: DC | PRN
  Filled 2020-03-11 (×2): qty 1

## 2020-03-11 MED ORDER — DOCUSATE SODIUM 100 MG PO CAPS: 100.0000 mg | ORAL_CAPSULE | Freq: Two times a day (BID) | ORAL | Status: DC | PRN

## 2020-03-11 MED ORDER — FENTANYL CITRATE (PF) 100 MCG/2ML IJ SOLN
50.0000 ug | INTRAMUSCULAR | Status: DC | PRN
  Administered 2020-03-11: 50 ug via INTRAVENOUS

## 2020-03-11 NOTE — H&P (Addendum)
History and Physical    Joanna Hall YDX:[id number] DOB: 07-Jul-1966 DOA: 03/11/2020  PCP: Towanda Malkin, MD  Patient coming from: home  I have personally briefly reviewed patient's old medical records in [place]  Chief Complaint: abdominal pain  HPI: Joanna Hall is a 54 y.o. 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  Pt reported that she woke up in the middle of the night with nausea and vomiting, and was unable to take her usual pain medication, which then lead to severe abdominal pain, typical of her cancer pain but much worse this time.  Pt also had drenching sweat.  Pt said she develops these episodes of severe nausea and vomiting with no particular reasons.  Last time this happened was about 2 weeks ago which also landed her in the ED.  Pt reported chronic watery diarrhea about 4-5 episodes per day.  No fever, dyspnea, dysuria.  Pt said she had stopped cancer treatment and is focusing on quality of life.     ED Course: initial vitals: afebrile, pulse 85, BP 190/111, sating 99% on room air.  Labs notable for K+ 3.3, alk phos 260, lipase 25, normal WBC, CT ab/pelvis showed cavitary metastases throughout the lung bases, finding suggestive of sigmoid colitis, enteritis, and persistent biliary duct dilatation with a 14 mm common bile duct, with no obstructing focus evident in this area.  Pt received IV pain meds in the ED before admission as inpatient for pain management.   Assessment/Plan Active Problems:   Cancer associated pain  # N/V and abdominal pain related to metastatic cervical cancer --supportive treatment --continue home oxycontin 30 mg q8h --IV dilaudid 1 mg q2h PRN --anti-emetics --Oncology palliative care provider [name] Borders to see pt  tomorrow  # Chronic diarrhea 2/2 hemicolectomy --CT a/p on presentation showed some signs of colitis and/or enteritis, however, no fever or leukocytosis to suggest acute infection. --obtain C diff --If neg, will order Imodium PRN --Hold abx for now  # Elevated BP, improved --2/2 to pain.  Improved with pain management.  Pt is not on home BP meds.  # factor V Leiden mutation with history of blood clots  --continue home Xarelto  # Hypokalemia --likely due to vomiting --Replete PRN  # GERD --continue home PPI  # Depression --continue home Elavil   DVT prophylaxis: HM:[id number]  Code Status: DNR  Family Communication:   Disposition Plan: home  Consults called: oncology Admission status: Inpatient   Review of Systems: As per HPI otherwise 10 point review of systems negative.   Past Medical History:  Diagnosis Date  . Abdominal pain 06/10/2018  . Abnormal cervical Papanicolaou smear 09/18/2017  . Anxiety   . Aortic atherosclerosis ([place])   . Arthritis    neck and knees  . Blood clots in brain    both lungs and right kidney  . Blood transfusion without reported diagnosis   . Cervical cancer ([place]) 09/2016   mets lung  . Chronic anal fissure   . Chronic diarrhea   . Dyspnea   . Erosive gastropathy 09/18/2017  . Factor V Leiden mutation ([place])   . Fecal incontinence   . Genital warts   . GERD (gastroesophageal reflux disease)   . GI bleed 10/08/2018  . Heart murmur   . Hematochezia   .  Hemorrhoids   . 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 Hepatitis, chronic ([place]) 05/05/2017  . History of cancer chemotherapy    completed 06/2017  . History of Clostridium difficile infection    while undergoing chemo.  Negative test 10/2017  . Ileocolic anastomotic leak   . 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 Intestinal infection due to Clostridium difficile 09/18/2017  . Lung cancer ([place])   . Macrocytic anemia with vitamin B12 deficiency    . Multiple gastric ulcers   . Nausea vomiting and diarrhea   . Pancolitis ([place]) 07/27/2018  . Perianal condylomata   . Pneumonia    History of  . Pulmonary nodules   . Rectal bleeding   . Small bowel obstruction ([place]) 08/2017  . Stiff neck    limited right turn  . Vitamin D deficiency     Past Surgical History:  Procedure Laterality Date  . CHOLECYSTECTOMY    . COLON SURGERY  08/2017   resection  . COLONOSCOPY WITH PROPOFOL N/A 12/20/2017   Procedure: COLONOSCOPY WITH PROPOFOL;  Surgeon: [name], MD;  Location: [facility] ENDOSCOPY;  Service: Gastroenterology;  Laterality: N/A;  . COLONOSCOPY WITH PROPOFOL N/A 07/30/2018   Procedure: COLONOSCOPY WITH PROPOFOL;  Surgeon: [name], MD;  Location[facility] ENDOSCOPY;  Service: Gastroenterology;  Laterality: N/A;  . COLONOSCOPY WITH PROPOFOL N/A 10/10/2018   Procedure: COLONOSCOPY WITH PROPOFOL;  Surgeon: Lucilla Lame, MD;  Location: [facility] ENDOSCOPY;  Service: Endoscopy;  Laterality: N/A;  . DIAGNOSTIC LAPAROSCOPY    . ESOPHAGOGASTRODUODENOSCOPY (EGD) WITH PROPOFOL N/A 12/20/2017   Procedure: ESOPHAGOGASTRODUODENOSCOPY (EGD) WITH PROPOFOL;  Surgeon: [name], MD;  Location: [place];  Service: Gastroenterology;  Laterality: N/A;  . ESOPHAGOGASTRODUODENOSCOPY (EGD) WITH PROPOFOL  07/30/2018   Procedure: ESOPHAGOGASTRODUODENOSCOPY (EGD) WITH PROPOFOL;  Surgeon: [name], MD;  Location: [facility] ENDOSCOPY;  Service: Gastroenterology;;  . Otho Darner SIGMOIDOSCOPY N/A 11/21/2018   Procedure: FLEXIBLE SIGMOIDOSCOPY;  Surgeon: [name], MD;  Location: [facility] ENDOSCOPY;  Service: Gastroenterology;  Laterality: N/A;  . IVC FILTER INSERTION N/A 01/01/2020   Procedure: IVC FILTER INSERTION;  Surgeon: Algernon Huxley, MD;  Location: [place] CV LAB;  Service: Cardiovascular;  Laterality: N/A;  . IVC FILTER REMOVAL N/A 02/23/2020   Procedure: IVC FILTER REMOVAL;  Surgeon: Algernon Huxley, MD;  Location: [place] CV LAB;  Service: Cardiovascular;  Laterality: N/A;  . LAPAROTOMY N/A 08/31/2017   Procedure: EXPLORATORY LAPAROTOMY for SBO, ileocolectomy, removal of piece of uterine wall;  Surgeon: [name], MD;  Location: [facility] ORS;  Service: General;  Laterality: N/A;  . LASER ABLATION CONDOLAMATA N/A 02/22/2018   Procedure: LASER ABLATION/REMOVAL OF YJEHUDJSHFW AROUND ANUS AND VAGINA;  Surgeon: Michael Boston, MD;  Location: [place];  Service: General;  Laterality: N/A;  . OOPHORECTOMY    . PORTA CATH INSERTION N/A 05/13/2018   Procedure: PORTA CATH INSERTION;  Surgeon: Katha Cabal, MD;  Location: [place] CV LAB;  Service: Cardiovascular;  Laterality: N/A;  . SMALL INTESTINE SURGERY    . TANDEM RING INSERTION     x3  . THORACOTOMY    . TOTAL KNEE ARTHROPLASTY Left 04/24/2018   Procedure: TOTAL KNEE ARTHROPLASTY;  Surgeon: Lovell Sheehan, MD;  Location: [facility] ORS;  Service: Orthopedics;  Laterality: Left;  . TOTAL KNEE ARTHROPLASTY Right 12/24/2019   Procedure: RIGHT TOTAL KNEE ARTHROPLASTY;  Surgeon: Lovell Sheehan, MD;  Location: [facility] ORS;  Service: Orthopedics;  Laterality: Right;     reports that she quit smoking about 13 years ago. Her smoking use included cigarettes. She has a 2.50 pack-year smoking history. She has never used smokeless tobacco. She reports previous alcohol use. She reports current drug use. Drug: Marijuana.  Allergies  Allergen Reactions  . Ketamine Anxiety and Other (See Comments)    Syncope episode/confusion     Family History  Problem Relation Age of Onset  . Hypertension Mother   . Hypertension Father   . Diabetes Father   . Hyperlipidemia Father   . Alcohol abuse Daughter   . Hypertension Maternal Grandmother   . Diabetes Maternal Grandmother   . Diabetes Paternal Grandmother   . Hypertension Paternal Grandmother      Prior to Admission medications   Medication Sig Start Date End Date Taking? Authorizing Provider    famotidine (PEPCID) 20 MG tablet Take 1 tablet (20 mg total) by mouth 2 (two) times daily. Patient taking differently: Take 20 mg by mouth daily as needed for heartburn.  10/18/19  Yes Carrie Mew, MD  folic acid (FOLVITE) 1 MG tablet Take 1 tablet (1 mg total) by mouth daily. 07/31/19  Yes Cammie Sickle, MD  hydrOXYzine (VISTARIL) 25 MG capsule Take 1 capsule (25 mg total) by mouth 3 (three) times daily as needed for itching. 03/09/20  Yes Cammie Sickle, MD  hyoscyamine (LEVBID) 0.375 MG 12 hr tablet Take 1 tablet (0.375 mg total) by mouth 2 (two) times daily. 03/09/20  Yes Vanga, Tally Due, MD  methocarbamol (ROBAXIN) 500 MG tablet Take 500 mg by mouth 4 (four) times daily.   Yes [provider]  Multiple Vitamins-Minerals (SUPER THERA VITE M) TABS Take 1 tablet by mouth daily.    Yes [provider]  ondansetron (ZOFRAN ODT) 4 MG disintegrating tablet Take 1 tablet (4 mg total) by mouth every 6 (six) hours as needed for nausea or vomiting. 01/14/20  Yes Delman Kitten, MD  oxyCODONE (ROXICODONE) 15 MG immediate release tablet Take 15 mg by mouth every 4 (four) hours as needed for pain.   Yes [provider]  OXYCODONE HCL ER PO Take 30 mg by mouth every 8 (eight) hours.    Yes [provider]  promethazine (PHENERGAN) 25 MG tablet Take 1 tablet (25 mg total) by mouth every 8 (eight) hours as needed for nausea or vomiting. 02/29/20  Yes Cammie Sickle, MD  rivaroxaban (XARELTO) 20 MG TABS tablet Take 1 tablet (20 mg total) by mouth daily with supper. 02/10/20  Yes Cammie Sickle, MD  amitriptyline (ELAVIL) 100 MG tablet Take 1 tablet (100 mg total) by mouth at bedtime. 11/10/19 03/01/21  [name], MD  diphenoxylate-atropine (LOMOTIL) 2.5-0.025 MG tablet TAKE (2) TABLETS FOUR TIMES DAILY. Patient not taking: Reported on 02/23/2020 02/04/20   [name], MD  naloxone [facility]) nasal spray 4 mg/0.1 mL 1 spray into nostril  upon signs of opioid overdose. Call 911. May repeat once if no response within 2-3 minutes. 03/02/20   Borders, Kirt Boys, NP  pantoprazole (PROTONIX) 40 MG tablet Take 1 tablet (40 mg total) by mouth daily. Patient not taking: Reported on 03/11/2020 12/05/19   Cammie Sickle, MD  triamcinolone ointment (KENALOG) 0.5 % Apply 1 application topically 2 (two) times daily. Patient not taking: Reported on 02/23/2020 11/20/19   Jacquelin Hawking, NP  VENTOLIN HFA 108 (90 Base) MCG/ACT inhaler Inhale 1-2 puffs into the  lungs every 4 (four) hours as needed for shortness of breath. 02/19/19   Verlon Au, NP    Physical Exam: Vitals:   03/11/20 1610 03/11/20 0839 03/11/20 1742  BP:  (!) 190/111 (!) 156/96  Pulse:  85 81  Resp:  20   Temp:  98.2 F (36.8 C) 98 F (36.7 C)  TempSrc:  Oral Oral  SpO2:  99% 100%  Weight: 66.9 kg    Height: 5' 8"  (1.727 m)      Constitutional: NAD, AAOx3 HEENT: conjunctivae and lids normal, EOMI CV: RRR no M,R,G. Distal pulses +2.  No cyanosis.   RESP: CTA B/L, normal respiratory effort  GI: +BS, NTND Extremities: No effusions, edema, or tenderness in BLE SKIN: warm, dry and intact Neuro: II - XII grossly intact.  Sensation intact Psych: Normal mood and affect.     Labs on Admission: I have personally reviewed following labs and imaging studies  CBC: Recent Labs  Lab 03/05/20 0919 03/11/20 0859  WBC 3.4* 5.9  NEUTROABS 2.0  --   HGB 12.6 12.2  HCT 38.2 37.4  MCV 92.7 94.7  PLT 194 960   Basic Metabolic Panel: Recent Labs  Lab 03/05/20 0919 03/11/20 0859  NA 133* 136  K 3.4* 3.3*  CL 100 102  CO2 25 25  GLUCOSE 98 110*  BUN 10 9  CREATININE 0.98 0.54  CALCIUM 9.0 8.9  MG 1.4*  --    GFR: Estimated Creatinine Clearance: 83 mL/min (by C-G formula based on SCr of 0.54 mg/dL). Liver Function Tests: Recent Labs  Lab 03/05/20 0919 03/11/20 0859  AST 71* 69*  ALT 55* 58*  ALKPHOS 235* 260*  BILITOT 0.6 0.7  PROT 7.2 7.1  ALBUMIN  3.4* 3.4*   Recent Labs  Lab 03/11/20 0859  LIPASE 25   No results for input(s): AMMONIA in the last 168 hours. Coagulation Profile: No results for input(s): INR, PROTIME in the last 168 hours. Cardiac Enzymes: No results for input(s): CKTOTAL, CKMB, CKMBINDEX, TROPONINI in the last 168 hours. BNP (last 3 results) No results for input(s): PROBNP in the last 8760 hours. HbA1C: No results for input(s): HGBA1C in the last 72 hours. CBG: No results for input(s): GLUCAP in the last 168 hours. Lipid Profile: No results for input(s): CHOL, HDL, LDLCALC, TRIG, CHOLHDL, LDLDIRECT in the last 72 hours. Thyroid Function Tests: No results for input(s): TSH, T4TOTAL, FREET4, T3FREE, THYROIDAB in the last 72 hours. Anemia Panel: No results for input(s): VITAMINB12, FOLATE, FERRITIN, TIBC, IRON, RETICCTPCT in the last 72 hours. Urine analysis:    Component Value Date/Time   COLORURINE YELLOW (A) 03/11/2020 1442   APPEARANCEUR CLEAR (A) 03/11/2020 1442   LABSPEC >1.046 (H) 03/11/2020 1442   PHURINE 7.0 03/11/2020 1442   GLUCOSEU NEGATIVE 03/11/2020 1442   HGBUR NEGATIVE 03/11/2020 1442   BILIRUBINUR NEGATIVE 03/11/2020 1442   KETONESUR NEGATIVE 03/11/2020 1442   PROTEINUR NEGATIVE 03/11/2020 1442   NITRITE NEGATIVE 03/11/2020 1442   LEUKOCYTESUR TRACE (A) 03/11/2020 1442    Radiological Exams on Admission: CT ABDOMEN PELVIS W CONTRAST  Result Date: 03/11/2020 CLINICAL DATA:  Abdominal pain. Reported metastatic cervical carcinoma. EXAM: CT ABDOMEN AND PELVIS WITH CONTRAST TECHNIQUE: Multidetector CT imaging of the abdomen and pelvis was performed using the standard protocol following bolus administration of intravenous contrast. CONTRAST:  46m OMNIPAQUE IOHEXOL 300 MG/ML  SOLN COMPARISON:  Feb 29, 2020 and January 14, 2020 FINDINGS: Lower chest: There are multiple pulmonary nodular lesions throughout the lung  bases, many of which are cavitary. These lesions appear stable and range in size  from as small as 4 mm to as large as 1.2 cm. No lung base edema or consolidation. Hepatobiliary: There is hepatic steatosis. No focal liver lesions are demonstrable. Gallbladder is absent. There is dilatation of the common hepatic and common bile ducts measuring up to 14 mm. There is tapering of the common bile duct at the level of the ampulla. No obstructing lesion is seen by CT. There is slight intrahepatic biliary duct dilatation. Pancreas: No pancreatic mass or inflammatory focus. Spleen: No splenic lesions are evident. Adrenals/Urinary Tract: Adrenals bilaterally appear normal. Kidneys bilaterally show no evident mass or hydronephrosis on either side. There is a 1 mm nonobstructing calculus in the lower pole of the right kidney. There is a 1 mm calculus in the mid left kidney. There is no evident ureteral calculus on either side. No convincing ureteral lesion on current examination. Urinary bladder wall thickness is within normal limits. Stomach/Bowel: Status post right hemicolectomy with anastomosis patent. There is subtle thickening of the wall of the sigmoid colon without diverticular disease evident. This area may represent a degree of colitis. There is mild enhancement of the wall of the distal ileum which may indicate a degree of enteritis. There is no evident bowel obstruction. No free air or portal venous air. Vascular/Lymphatic: No abdominal aortic aneurysm. There is aortic and common iliac artery atherosclerosis. No appreciable adenopathy in the abdomen or pelvis. Reproductive: Uterus is anteverted. No pelvic mass evident currently. Question edema in the upper vagina/cervical region. Other: No abscess or ascites evident in the abdomen or pelvis. Musculoskeletal: Apparent sacral insufficiency fractures are again noted there is degenerative change in the lumbar spine. Sclerosis is again noted in the medial superior pubic ramus. No destructive bone lesions evident. No intramuscular lesions. IMPRESSION:  1. 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 It should be noted that septic emboli could present similarly. 2. Suspect a degree of sigmoid colitis with somewhat ill-defined bowel wall in this area. Question enteritis in the distal ileum with mild enhancement of the wall in this area. Bowel otherwise appears unremarkable. Postoperative change in the right colon with evidence of previous hemicolectomy. Anastomosis patent in this area. 3.  Apparent edema in the upper vagina and cervix region. 4. Small calculi in each kidney. No hydronephrosis on either side. No ureteral calculi. No ureteral lesions seen on this study. 5. Gallbladder absent. Persistent biliary duct dilatation with a 14 mm common bile duct. No obstructing focus evident in this area. Etiology for this bowel dilatation uncertain. MRCP would be the optimum imaging study of choice to further evaluate this region. 6. Insufficiency fractures in the sacrum. Question radiation therapy change in the medial left superior pubic ramus. Appearance stable in this area. 7.  Diffuse hepatic steatosis. Electronically Signed   By: Lowella Grip III M.D.   On: 03/11/2020 14:34      Enzo Bi MD [facility]  If 7PM-7AM, please contact night-coverage 03/11/2020, 7:19 PM

## 2020-03-11 NOTE — ED Notes (Signed)
Pt calling out wanting more pain medication and has received 171mg of fentanyl in the past hour, will notify MD.

## 2020-03-11 NOTE — ED Provider Notes (Signed)
[facility] Emergency Department Provider Note  ____________________________________________   None    (approximate)  I have reviewed the triage vital signs and the nursing notes.   HISTORY  Chief Complaint Abdominal Pain    HPI TYNIAH KASTENS is a 54 y.o. female with metastatic cervical cancer currently on palliative care, on Eliquis for DVTs who comes in for worsening abdominal pain.  Patient states worsening pain that started today, constant, severe, not better with her medications although not been able to keep them down due to nausea and vomiting.  States that this is the worst she has felt in a long time.  She states that she was able to get her pain medication sorted out since I last saw her on 5/16.          Past Medical History:  Diagnosis Date  . Abdominal pain 06/10/2018  . Abnormal cervical Papanicolaou smear 09/18/2017  . Anxiety   . Aortic atherosclerosis ([place])   . Arthritis    neck and knees  . Blood clots in brain    both lungs and right kidney  . Blood transfusion without reported diagnosis   . Cervical cancer ([place]) 09/2016   mets lung  . Chronic anal fissure   . Chronic diarrhea   . Dyspnea   . Erosive gastropathy 09/18/2017  . Factor V Leiden mutation ([place])   . Fecal incontinence   . Genital warts   . GERD (gastroesophageal reflux disease)   . GI bleed 10/08/2018  . Heart murmur   . Hematochezia   . Hemorrhoids   . 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 Hepatitis, chronic ([place]) 05/05/2017  . History of cancer chemotherapy    completed 06/2017  . History of Clostridium difficile infection    while undergoing chemo.  Negative test 10/2017  . Ileocolic anastomotic leak   . Infarction of kidney ([place]) left kidney   and uterus  . Intestinal infection due to Clostridium difficile 09/18/2017  . Lung cancer ([place])   . Macrocytic anemia with vitamin B12 deficiency   . Multiple gastric ulcers   .  Nausea vomiting and diarrhea   . Pancolitis ([place]) 07/27/2018  . Perianal condylomata   . Pneumonia    History of  . Pulmonary nodules   . Rectal bleeding   . Small bowel obstruction ([place]) 08/2017  . Stiff neck    limited right turn  . Vitamin D deficiency     Patient Active Problem List   Diagnosis Date Noted  . S/P IVC filter 02/13/2020  . Hydroureter 12/29/2019  . Transaminitis   . Right leg pain 12/28/2019  . History of total knee arthroplasty, right 12/24/2019  . Moderate episode of recurrent major depressive disorder ([place]) 11/13/2019  . Acute colitis 09/25/2019  . Anxiety and depression   . Thrombocytopenia ([place]) 08/17/2019  . Intractable lower abdominal pain   . Abdominal pain 07/23/2019  . Constipation 07/22/2019  . Malnutrition of moderate degree 06/22/2019  . Small bowel obstruction due to adhesions ([place]) 06/21/2019  . Acute gastroenteritis 06/14/2019  . Intractable nausea and vomiting 06/07/2019  . Chest pain 05/19/2019  . Intractable pain 05/19/2019  . Cancer associated pain   . Proctitis, radiation   . Palliative care encounter   . Encounter for monitoring opioid maintenance therapy 11/19/2018  . Adrenal insufficiency ([place]) 09/03/2018  . Nausea vomiting and diarrhea   . Chronic diarrhea 06/25/2018  . Chronic  anticoagulation 06/25/2018  . Vomiting   . Encounter for antineoplastic chemotherapy 06/04/2018  . 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 Lung metastasis ([place]) 05/15/2018  . Closed fracture of distal end of radius 05/04/2018  . History of total knee arthroplasty 04/24/2018  . Osteoarthritis of left knee 02/28/2018  . Condyloma acuminatum of anus s/p ablation 02/22/2018 02/22/2018  . Condyloma acuminatum of vagina s/p ablation 02/22/2018 02/22/2018  . Positive ANA (antinuclear antibody) 02/04/2018  . Aortic atherosclerosis ([place]) 01/15/2018  . Elevated MCV 01/15/2018  . Anemia 01/15/2018  . Genital warts 01/15/2018  . Vitamin D deficiency  01/15/2018  . Pernicious anemia   . Impingement syndrome of shoulder region 12/19/2017  . Multiple joint pain 12/19/2017  . Osteoarthritis of right knee 12/19/2017  . B12 deficiency 12/11/2017  . Lung nodules 12/11/2017  . History of Clostridium difficile infection 10/23/2017  . Factor V Leiden ([place]) 10/19/2017  . Goals of care, counseling/discussion 10/19/2017  . Cervical cancer, FIGO stage IB1 ([place]) 09/18/2017  . Chronic hepatitis C without hepatic coma ([place]) 09/18/2017  . Cytopenia 09/18/2017  . Diarrhea 09/18/2017  . Elevated LFTs 09/18/2017  . Lower abdominal pain 09/18/2017  . 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 Malignant neoplasm of overlapping sites of cervix ([place]) 09/18/2017  . Renal insufficiency 09/18/2017  . Wound infection after surgery 09/12/2017  . Hypokalemia   . Hypomagnesemia   . Cervical arthritis 07/18/2017  . Dysuria 06/20/2017  . Metastasis from cervical cancer ([place]) 05/12/2017  . Essential hypertension 03/15/2017  . Anemia of chronic disease 03/01/2017  . Chemotherapy-induced neutropenia ([place]) 01/29/2017  . 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[place]) 09/25/2016    Past Surgical History:  Procedure Laterality Date  . CHOLECYSTECTOMY    . COLON SURGERY  08/2017   resection  . COLONOSCOPY WITH PROPOFOL N/A 12/20/2017   Procedure: COLONOSCOPY WITH PROPOFOL;  Surgeon: [name], MD;  Location: [facility] ENDOSCOPY;  Service: Gastroenterology;  Laterality: N/A;  . COLONOSCOPY WITH PROPOFOL N/A 07/30/2018   Procedure: COLONOSCOPY WITH PROPOFOL;  Surgeon: [name], MD;  Location: [facility] ENDOSCOPY;  Service: Gastroenterology;  Laterality: N/A;  . COLONOSCOPY WITH PROPOFOL N/A 10/10/2018   Procedure: COLONOSCOPY WITH PROPOFOL;  Surgeon: Lucilla Lame, MD;  Location: [facility] ENDOSCOPY;  Service: Endoscopy;  Laterality: N/A;  . DIAGNOSTIC LAPAROSCOPY    . ESOPHAGOGASTRODUODENOSCOPY (EGD) WITH PROPOFOL N/A 12/20/2017   Procedure: ESOPHAGOGASTRODUODENOSCOPY (EGD)  WITH PROPOFOL;  Surgeon: [name], MD;  Location: [place];  Service: Gastroenterology;  Laterality: N/A;  . ESOPHAGOGASTRODUODENOSCOPY (EGD) WITH PROPOFOL  07/30/2018   Procedure: ESOPHAGOGASTRODUODENOSCOPY (EGD) WITH PROPOFOL;  Surgeon: [name], MD;  Location: [facility] ENDOSCOPY;  Service: Gastroenterology;;  . Otho Darner SIGMOIDOSCOPY N/A 11/21/2018   Procedure: FLEXIBLE SIGMOIDOSCOPY;  Surgeon: [name], MD;  Location: [facility] ENDOSCOPY;  Service: Gastroenterology;  Laterality: N/A;  . IVC FILTER INSERTION N/A 01/01/2020   Procedure: IVC FILTER INSERTION;  Surgeon: Algernon Huxley, MD;  Location: [place] CV LAB;  Service: Cardiovascular;  Laterality: N/A;  . IVC FILTER REMOVAL N/A 02/23/2020   Procedure: IVC FILTER REMOVAL;  Surgeon: Algernon Huxley, MD;  Location: [place] CV LAB;  Service: Cardiovascular;  Laterality: N/A;  . LAPAROTOMY N/A 08/31/2017   Procedure: EXPLORATORY LAPAROTOMY for SBO, ileocolectomy, removal of piece of uterine wall;  Surgeon: [name], MD;  Location: [facility] ORS;  Service: General;  Laterality: N/A;  . LASER ABLATION CONDOLAMATA N/A 02/22/2018   Procedure: LASER ABLATION/REMOVAL OF [id number] AROUND ANUS AND VAGINA;  Surgeon: Michael Boston,  MD;  Location: [place];  Service: General;  Laterality: N/A;  . OOPHORECTOMY    . PORTA CATH INSERTION N/A 05/13/2018   Procedure: PORTA CATH INSERTION;  Surgeon: Katha Cabal, MD;  Location: [place] CV LAB;  Service: Cardiovascular;  Laterality: N/A;  . SMALL INTESTINE SURGERY    . TANDEM RING INSERTION     x3  . THORACOTOMY    . TOTAL KNEE ARTHROPLASTY Left 04/24/2018   Procedure: TOTAL KNEE ARTHROPLASTY;  Surgeon: Lovell Sheehan, MD;  Location: [facility] ORS;  Service: Orthopedics;  Laterality: Left;  . TOTAL KNEE ARTHROPLASTY Right 12/24/2019   Procedure: RIGHT TOTAL KNEE ARTHROPLASTY;  Surgeon: Lovell Sheehan, MD;  Location: [facility] ORS;  Service: Orthopedics;   Laterality: Right;    Prior to Admission medications   Medication Sig Start Date End Date Taking? Authorizing Provider  amitriptyline (ELAVIL) 100 MG tablet Take 1 tablet (100 mg total) by mouth at bedtime. 11/10/19 03/01/21  [name], MD  diphenoxylate-atropine (LOMOTIL) 2.5-0.025 MG tablet TAKE (2) TABLETS FOUR TIMES DAILY. Patient not taking: Reported on 02/23/2020 02/04/20   [name], MD  famotidine (PEPCID) 20 MG tablet Take 1 tablet (20 mg total) by mouth 2 (two) times daily. Patient taking differently: Take 20 mg by mouth daily as needed for heartburn.  10/18/19   Carrie Mew, MD  folic acid (FOLVITE) 1 MG tablet Take 1 tablet (1 mg total) by mouth daily. 07/31/19   Cammie Sickle, MD  hydrOXYzine (VISTARIL) 25 MG capsule Take 1 capsule (25 mg total) by mouth 3 (three) times daily as needed for itching. 03/09/20   Cammie Sickle, MD  hyoscyamine (LEVBID) 0.375 MG 12 hr tablet Take 1 tablet (0.375 mg total) by mouth 2 (two) times daily. 03/09/20   [name], MD  Multiple Vitamins-Minerals (SUPER THERA VITE M) TABS Take 1 tablet by mouth daily.     [provider]  naloxone [facility]) nasal spray 4 mg/0.1 mL 1 spray into nostril upon signs of opioid overdose. Call 911. May repeat once if no response within 2-3 minutes. 03/02/20   Borders, Kirt Boys, NP  ondansetron (ZOFRAN ODT) 4 MG disintegrating tablet Take 1 tablet (4 mg total) by mouth every 6 (six) hours as needed for nausea or vomiting. 01/14/20   Delman Kitten, MD  OXYCODONE HCL ER PO Take 30 mg by mouth every 8 (eight) hours.     [provider]  pantoprazole (PROTONIX) 40 MG tablet Take 1 tablet (40 mg total) by mouth daily. 12/05/19   Cammie Sickle, MD  promethazine (PHENERGAN) 25 MG tablet Take 1 tablet (25 mg total) by mouth every 8 (eight) hours as needed for nausea or vomiting. 02/29/20   Cammie Sickle, MD  rivaroxaban (XARELTO) 20 MG TABS tablet Take 1 tablet  (20 mg total) by mouth daily with supper. 02/10/20   Cammie Sickle, MD  triamcinolone ointment (KENALOG) 0.5 % Apply 1 application topically 2 (two) times daily. Patient not taking: Reported on 02/23/2020 11/20/19   Jacquelin Hawking, NP  VENTOLIN HFA 108 (90 Base) MCG/ACT inhaler Inhale 1-2 puffs into the lungs every 4 (four) hours as needed for shortness of breath. 02/19/19   Verlon Au, NP    Allergies Ketamine  Family History  Problem Relation Age of Onset  . Hypertension Mother   . Hypertension Father   . Diabetes Father   . Hyperlipidemia Father   . Alcohol abuse Daughter   .  Hypertension Maternal Grandmother   . Diabetes Maternal Grandmother   . Diabetes Paternal Grandmother   . Hypertension Paternal Grandmother     Social History Social History   Tobacco Use  . Smoking status: Former Smoker    Packs/day: 0.25    Years: 10.00    Pack years: 2.50    Types: Cigarettes    Quit date: 10/16/2006    Years since quitting: 13.4  . Smokeless tobacco: Never Used  Substance Use Topics  . Alcohol use: Not Currently    Comment: seldom  . Drug use: Yes    Types: Marijuana    Comment: not very often      Review of Systems Constitutional: No fever/chills Eyes: No visual changes. ENT: No sore throat. Cardiovascular: Denies chest pain. Respiratory: Denies shortness of breath. Gastrointestinal: Positive nausea, vomiting, abdominal pain Genitourinary: Negative for dysuria. Musculoskeletal: Negative for back pain. Skin: Negative for rash. Neurological: Negative for headaches, focal weakness or numbness. All other ROS negative ____________________________________________   PHYSICAL EXAM:  VITAL SIGNS: ED Triage Vitals  Enc Vitals Group     BP 03/11/20 0839 (!) 190/111     Pulse Rate 03/11/20 0839 85     Resp 03/11/20 0839 20     Temp 03/11/20 0839 98.2 F (36.8 C)     Temp Source 03/11/20 0839 Oral     SpO2 03/11/20 0839 99 %     Weight 03/11/20 0837 147  lb 7.8 oz (66.9 kg)     Height 03/11/20 0837 5' 8"  (1.727 m)     Head Circumference --      Peak Flow --      Pain Score 03/11/20 0836 10     Pain Loc --      Pain Edu? --      Excl. in [place]? --     Constitutional: Alert and oriented.  Appears in pain appears older than stated age Eyes: Conjunctivae are normal. EOMI. Head: Atraumatic. Nose: No congestion/rhinnorhea. Mouth/Throat: Mucous membranes are moist.   Neck: No stridor. Trachea Midline. FROM Cardiovascular: Normal rate, regular rhythm. Grossly normal heart sounds.  Good peripheral circulation. Respiratory: Normal respiratory effort.  No retractions. Lungs CTAB. Gastrointestinal: Diffusely tender, no rebound, no guarding Musculoskeletal: No lower extremity tenderness nor edema.  No joint effusions. Neurologic:  Normal speech and language. No gross focal neurologic deficits are appreciated.  Skin:  Skin is warm, dry and intact. No rash noted. Psychiatric: Mood and affect are normal. Speech and behavior are normal. GU: Deferred   ____________________________________________   LABS (all labs ordered are listed, but only abnormal results are displayed)  Labs Reviewed  COMPREHENSIVE METABOLIC PANEL - Abnormal; Notable for the following components:      Result Value   Potassium 3.3 (*)    Glucose, Bld 110 (*)    Albumin 3.4 (*)    AST 69 (*)    ALT 58 (*)    Alkaline Phosphatase 260 (*)    All other components within normal limits  LIPASE, BLOOD  CBC  URINALYSIS, COMPLETE (UACMP) WITH MICROSCOPIC   ____________________________________________   ED ECG REPORT I, Vanessa Ramblewood, the attending physician, personally viewed and interpreted this ECG.  EKG normal sinus, no ST elevation, no T wave inversions, normal intervals ____________________________________________  RADIOLOGY   Official radiology report(s): CT ABDOMEN PELVIS W CONTRAST  Result Date: 03/11/2020 CLINICAL DATA:  Abdominal pain. Reported metastatic  cervical carcinoma. EXAM: CT ABDOMEN AND PELVIS WITH CONTRAST TECHNIQUE: Multidetector CT imaging of  the abdomen and pelvis was performed using the standard protocol following bolus administration of intravenous contrast. CONTRAST:  32m OMNIPAQUE IOHEXOL 300 MG/ML  SOLN COMPARISON:  Feb 29, 2020 and January 14, 2020 FINDINGS: Lower chest: There are multiple pulmonary nodular lesions throughout the lung bases, many of which are cavitary. These lesions appear stable and range in size from as small as 4 mm to as large as 1.2 cm. No lung base edema or consolidation. Hepatobiliary: There is hepatic steatosis. No focal liver lesions are demonstrable. Gallbladder is absent. There is dilatation of the common hepatic and common bile ducts measuring up to 14 mm. There is tapering of the common bile duct at the level of the ampulla. No obstructing lesion is seen by CT. There is slight intrahepatic biliary duct dilatation. Pancreas: No pancreatic mass or inflammatory focus. Spleen: No splenic lesions are evident. Adrenals/Urinary Tract: Adrenals bilaterally appear normal. Kidneys bilaterally show no evident mass or hydronephrosis on either side. There is a 1 mm nonobstructing calculus in the lower pole of the right kidney. There is a 1 mm calculus in the mid left kidney. There is no evident ureteral calculus on either side. No convincing ureteral lesion on current examination. Urinary bladder wall thickness is within normal limits. Stomach/Bowel: Status post right hemicolectomy with anastomosis patent. There is subtle thickening of the wall of the sigmoid colon without diverticular disease evident. This area may represent a degree of colitis. There is mild enhancement of the wall of the distal ileum which may indicate a degree of enteritis. There is no evident bowel obstruction. No free air or portal venous air. Vascular/Lymphatic: No abdominal aortic aneurysm. There is aortic and common iliac artery atherosclerosis. No  appreciable adenopathy in the abdomen or pelvis. Reproductive: Uterus is anteverted. No pelvic mass evident currently. Question edema in the upper vagina/cervical region. Other: No abscess or ascites evident in the abdomen or pelvis. Musculoskeletal: Apparent sacral insufficiency fractures are again noted there is degenerative change in the lumbar spine. Sclerosis is again noted in the medial superior pubic ramus. No destructive bone lesions evident. No intramuscular lesions. IMPRESSION: 1. 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 It should be noted that septic emboli could present similarly. 2. Suspect a degree of sigmoid colitis with somewhat ill-defined bowel wall in this area. Question enteritis in the distal ileum with mild enhancement of the wall in this area. Bowel otherwise appears unremarkable. Postoperative change in the right colon with evidence of previous hemicolectomy. Anastomosis patent in this area. 3.  Apparent edema in the upper vagina and cervix region. 4. Small calculi in each kidney. No hydronephrosis on either side. No ureteral calculi. No ureteral lesions seen on this study. 5. Gallbladder absent. Persistent biliary duct dilatation with a 14 mm common bile duct. No obstructing focus evident in this area. Etiology for this bowel dilatation uncertain. MRCP would be the optimum imaging study of choice to further evaluate this region. 6. Insufficiency fractures in the sacrum. Question radiation therapy change in the medial left superior pubic ramus. Appearance stable in this area. 7.  Diffuse hepatic steatosis. Electronically Signed   By: WLowella GripIII M.D.   On: 03/11/2020 14:34    ____________________________________________   PROCEDURES  Procedure(s) performed (including Critical Care):  Procedures   ____________________________________________   INITIAL IMPRESSION / ASSESSMENT AND PLAN / ED COURSE  KIYANNA DRUMMERwas evaluated in Emergency  Department on 03/11/2020 for the symptoms described in the history of present illness. She was evaluated in  the context of the global COVID-19 pandemic, which necessitated consideration that the patient might be at risk for infection with the SARS-CoV-2 virus that causes COVID-19. Institutional protocols and algorithms that pertain to the evaluation of patients at risk for COVID-19 are in a state of rapid change based on information released by regulatory bodies including the CDC and federal and state organizations. These policies and algorithms were followed during the patient's care in the ED.    Patient is a 53 year old who is well-known to me.  Patient has cervical cancer currently on palliative care who comes in with worsening abdominal pain.  Discussed with patient given her history of obstruction and she would like to proceed with CT scan develop obstruction, perforation, worsening burden.  Patient reports a little bit of chest pain but states that she thinks it was just from the vomiting.  None currently.  No shortness of breath to suggest PE and been taking her blood thinner.  We will try to control patient's pain with IV Dilaudid and Phenergan.  Will need to discuss with patient for possible admission for pain control versus going home if her CT is reassuring and she is feeling better  Ct concerning for colitis. Pt does report prior c diff-- will send stool studies. Holding off antibiotics given well appearing for now.   D/w pt and states still having signifficant pain after IV dilaudid. Requesting admission for uncontrolled n/v pain 2/2 severe abd pain.  Will d/w hospital for admmission       ____________________________________________   FINAL CLINICAL IMPRESSION(S) / ED DIAGNOSES   Final diagnoses:  Intractable vomiting, presence of nausea not specified, unspecified vomiting type  Malignant neoplasm of cervix, unspecified site (HCC)  Abdominal pain, unspecified abdominal location    Colitis      MEDICATIONS GIVEN DURING THIS VISIT:  Medications  fentaNYL (SUBLIMAZE) injection 50 mcg (50 mcg Intravenous Given 03/11/20 0903)  ondansetron (ZOFRAN-ODT) disintegrating tablet 4 mg (4 mg Oral Given 03/11/20 0842)  HYDROmorphone (DILAUDID) injection 1 mg (1 mg Intravenous Given 03/11/20 1030)     ED Discharge Orders    None       Note:  This document was prepared using Dragon voice recognition software and may include unintentional dictation errors.   Vanessa [place], MD 03/11/20 [phone]

## 2020-03-11 NOTE — ED Triage Notes (Signed)
Patient to ER for c/o abdominal pain "all over". Patient has h/o metastatic cervical cancer and reports chronic pain. Reports pain currently is worse than it has been, but similar in type of pain. Patient with emesis bag with scant amount of emesis present upon arrival to triage room.

## 2020-03-11 NOTE — ED Triage Notes (Signed)
Pt comes into the ED via EMS from home with c/o epigastric, chest pain all night, pt is moaning/crying out in the WR, 54m zofran IM at 8am..192/102 b/p, 97.7, CBG 121, metastatic cervical cancer.

## 2020-03-11 NOTE — ED Notes (Signed)
EDP [place] at bedside.

## 2020-03-12 DIAGNOSIS — Z515 Encounter for palliative care: Secondary | ICD-10-CM

## 2020-03-12 DIAGNOSIS — R109 Unspecified abdominal pain: Secondary | ICD-10-CM

## 2020-03-12 DIAGNOSIS — G893 Neoplasm related pain (acute) (chronic): Principal | ICD-10-CM

## 2020-03-12 LAB — CBC
HCT: 34.2 % — ABNORMAL LOW (ref 36.0–46.0)
Hemoglobin: 11.7 g/dL — ABNORMAL LOW (ref 12.0–15.0)
MCH: 31.3 pg (ref 26.0–34.0)
MCHC: 34.2 g/dL (ref 30.0–36.0)
MCV: 91.4 fL (ref 80.0–100.0)
Platelets: 179 10*3/uL (ref 150–400)
RBC: 3.74 MIL/uL — ABNORMAL LOW (ref 3.87–5.11)
RDW: 14.8 % (ref 11.5–15.5)
WBC: 4 10*3/uL (ref 4.0–10.5)
nRBC: 0 % (ref 0.0–0.2)

## 2020-03-12 LAB — BASIC METABOLIC PANEL
Anion gap: 7 (ref 5–15)
BUN: 9 mg/dL (ref 6–20)
CO2: 27 mmol/L (ref 22–32)
Calcium: 9.1 mg/dL (ref 8.9–10.3)
Chloride: 103 mmol/L (ref 98–111)
Creatinine, Ser: 0.62 mg/dL (ref 0.44–1.00)
GFR calc Af Amer: >60 mL/min (ref >60)
GFR calc non Af Amer: >60 mL/min (ref >60)
Glucose, Bld: 116 mg/dL — ABNORMAL HIGH (ref 70–99)
Potassium: 4 mmol/L (ref 3.5–5.1)
Sodium: 137 mmol/L (ref 135–145)

## 2020-03-12 LAB — MAGNESIUM: Magnesium: 1.3 mg/dL — ABNORMAL LOW (ref 1.7–2.4)

## 2020-03-12 LAB — HIV ANTIBODY (ROUTINE TESTING W REFLEX): HIV Screen 4th Generation wRfx: NONREACTIVE

## 2020-03-12 MED ORDER — OXYCODONE HCL ER 10 MG PO T12A
60.0000 mg | EXTENDED_RELEASE_TABLET | Freq: Two times a day (BID) | ORAL | Status: DC
  Administered 2020-03-12 – 2020-03-13 (×2): 60 mg via ORAL
  Filled 2020-03-12 (×2): qty 6

## 2020-03-12 MED ORDER — HYDROMORPHONE HCL 1 MG/ML IJ SOLN
1.0000 mg | INTRAMUSCULAR | Status: DC | PRN
  Administered 2020-03-12 – 2020-03-13 (×7): 1 mg via INTRAVENOUS
  Filled 2020-03-12 (×7): qty 1

## 2020-03-12 MED ORDER — CHLORHEXIDINE GLUCONATE CLOTH 2 % EX PADS
6.0000 | MEDICATED_PAD | Freq: Every day | CUTANEOUS | Status: DC
  Administered 2020-03-12 – 2020-03-13 (×2): 6 via TOPICAL

## 2020-03-12 MED ORDER — ENSURE ENLIVE PO LIQD: 237.0000 mL | Freq: Two times a day (BID) | ORAL | Status: DC

## 2020-03-12 MED ORDER — OXYCODONE HCL 5 MG PO TABS
15.0000 mg | ORAL_TABLET | ORAL | Status: DC | PRN
  Administered 2020-03-12 (×2): 15 mg via ORAL
  Filled 2020-03-12 (×2): qty 3

## 2020-03-12 MED ORDER — BOOST / RESOURCE BREEZE PO LIQD CUSTOM: 1.0000 | Freq: Two times a day (BID) | ORAL | Status: DC

## 2020-03-12 MED ORDER — MAGNESIUM SULFATE 2 GM/50ML IV SOLN
2.0000 g | INTRAVENOUS | Status: AC
  Administered 2020-03-12 (×2): 2 g via INTRAVENOUS
  Filled 2020-03-12 (×3): qty 50

## 2020-03-12 NOTE — Progress Notes (Signed)
Ch visited with Pt in response to Pt's request to see Ch. Upon arrival, Pt reported that she wanted to visit with Sherrilyn Rist, who worked at [employer] [facility] last year.

## 2020-03-12 NOTE — Progress Notes (Signed)
Initial Nutrition Assessment  DOCUMENTATION CODES:   Not applicable  INTERVENTION:  Ensure Enlive po BID, each supplement provides 350 kcal and 20 grams of protein  Boost Breeze po with breakfast and lunch meals, each supplement provides 250 kcal and 9 grams of protein    NUTRITION DIAGNOSIS:   Increased nutrient needs related to (cervical cancer metastatic to lungs not on chemotherapy) as evidenced by estimated needs.    GOAL:   Patient will meet greater than or equal to 90% of their needs    MONITOR:   PO intake, Labs, I & O's, Supplement acceptance, Weight trends  REASON FOR ASSESSMENT:   Malnutrition Screening Tool    ASSESSMENT:  RD working remotely.  54 year old female admitted for pain management with past medical history of cervical cancer metastatic to lungs, stopped chemo 07/2019, chronic diarrhea, chronic anal fissure, factor V Leiden mutation with history of blood clots in brain, lungs, and right kidney, GERD, hepatitis C, and anxiety presented with abdominal pain with nausea and vomiting. CT abdomen pelvis showed cavitary metastases throughout lung bases, findings suggestive of sigmoid colitis, enteritis, and persistent biliary duct dilation.  RD attempted to reach patient via phone, however line was busy x 2 attempts. Patient is on regular diet, per flowsheets, she consumed 100% of breakfast this morning. Will provide Boost Breeze and Ensure supplements to aid with meeting needs. Patient seen by PCT for symptom management and goals of care. Per notes, patient focus on quality of life over quantity. She is not interested in further cancer treatment. PCT to see pt in clinic next week, home with hospice when medically stable.   Current weight 147.18 lbs.  Per history, on 5/16 pt weighed 143.66 lb, on 4/27 pt weighed 148.72 lb, on 3/31 pt weighed 161.7 lb. This indicates a 14.52 lb (9%) wt loss in 2 months which is significant. Given weight trends as well as  history of present illness, highly suspect malnutrition however unable to identify at this time.  Medications reviewed and include: Levbid, Oxycontin, Protonix IVPB: Magnesium sulfate Labs: Mg 1.3   NUTRITION - FOCUSED PHYSICAL EXAM: Unable to complete at this time, RD working remotely.   Diet Order:   Diet Order            Diet regular Room service appropriate? Yes; Fluid consistency: Thin  Diet effective now              EDUCATION NEEDS:   No education needs have been identified at this time  Skin:  Skin Assessment: Reviewed RN Assessment  Last BM:  5/27  Height:   Ht Readings from Last 1 Encounters:  03/11/20 5' 8"  (1.727 m)    Weight:   Wt Readings from Last 1 Encounters:  03/11/20 66.9 kg    BMI:  Body mass index is 22.43 kg/m.  Estimated Nutritional Needs:   Kcal:  2000-2200  Protein:  100-110  Fluid:  >/= 2 L/day   Lajuan Lines, RD, LDN Clinical Nutrition After Hours/Weekend Pager # in [place]

## 2020-03-12 NOTE — TOC Initial Note (Signed)
Transition of Care [facility]) - Initial/Assessment Note    Patient Details  Name: Joanna Hall MRN: [id number] Date of Birth: Feb 15, 1966  Transition of Care [facility]) CM/SW Contact:    Elease Hashimoto, LCSW Phone Number: 03/12/2020, 2:59 PM  Clinical Narrative:  Met with pt to discuss her choice regarding hospice agencies. She voiced she is tired of doing cancern treatments. She has been dealing with this for the last four years, she wants to focus on the quality of life and not the quantity. She feels at peace with this decision and is looking forward to talking with Olivia Mackie from [facility]. Have informed Olivia Mackie she has chosen them and would like to speak with her. Olivia Mackie will see pt today. Pt reports going home tomorrow. She has good supports via her family and extended family, she has all needed equipment.           Expected Discharge Plan: Home w Hospice Care Barriers to Discharge: Continued Medical Work up   Patient Goals and CMS Choice Patient states their goals for this hospitalization and ongoing recovery are:: I have decided to stop the treatments I am tired four years of this is tiring CMS Medicare.gov Compare Post Acute Care list provided to:: Patient Choice offered to / list presented to : Patient  Expected Discharge Plan and Services Expected Discharge Plan: Home w Hospice Care In-house Referral: Clinical Social Work   Post Acute Care Choice: Hospice Living arrangements for the past 2 months: [place]                                      Prior Living Arrangements/Services Living arrangements for the past 2 months: Single Family Home Lives with:: Parents Patient language and need for interpreter reviewed:: No Do you feel safe going back to the place where you live?: Yes      Need for Family Participation in Patient Care: Yes (Comment) Care giver support system in place?: Yes (comment)   Criminal Activity/Legal Involvement Pertinent to Current  Situation/Hospitalization: No - Comment as needed  Activities of Daily Living Home Assistive Devices/Equipment: None ADL Screening (condition at time of admission) Patient's cognitive ability adequate to safely complete daily activities?: Yes Is the patient deaf or have difficulty hearing?: No Does the patient have difficulty seeing, even when wearing glasses/contacts?: No Does the patient have difficulty concentrating, remembering, or making decisions?: No Patient able to express need for assistance with ADLs?: Yes Does the patient have difficulty dressing or bathing?: No Independently performs ADLs?: Yes (appropriate for developmental age) Does the patient have difficulty walking or climbing stairs?: No Weakness of Legs: None Weakness of Arms/Hands: None  Permission Sought/Granted Permission sought to share information with : Family Supports, Chartered certified accountant granted to share information with : Yes, Verbal Permission Granted  Share Information with NAME: Pamala Hurry  Permission granted to share info w AGENCY: [facility]  Permission granted to share info w Relationship: Mom  Permission granted to share info w Contact Information: Olivia Mackie  Emotional Assessment Appearance:: Appears stated age Attitude/Demeanor/Rapport: Gracious Affect (typically observed): Accepting, Calm Orientation: : Oriented to Self, Oriented to Place, Oriented to  Time, Oriented to Situation   Psych Involvement: No (comment)  Admission diagnosis:  Colitis [K52.9] Cancer associated pain [G89.3] Abdominal pain, unspecified abdominal location [R10.9] Malignant neoplasm of cervix, unspecified site ([place]) [C53.9] Intractable vomiting, presence of nausea not specified, unspecified vomiting type [R11.10]  Patient Active Problem List   Diagnosis Date Noted  . S/P IVC filter 02/13/2020  . Hydroureter 12/29/2019  . Transaminitis   . Right leg pain 12/28/2019  . History of total knee  arthroplasty, right 12/24/2019  . Moderate episode of recurrent major depressive disorder ([place]) 11/13/2019  . Acute colitis 09/25/2019  . Anxiety and depression   . Thrombocytopenia ([place]) 08/17/2019  . Intractable lower abdominal pain   . Abdominal pain 07/23/2019  . Constipation 07/22/2019  . Malnutrition of moderate degree 06/22/2019  . Small bowel obstruction due to adhesions ([place]) 06/21/2019  . Acute gastroenteritis 06/14/2019  . Intractable nausea and vomiting 06/07/2019  . Chest pain 05/19/2019  . Intractable pain 05/19/2019  . Cancer associated pain   . Proctitis, radiation   . Palliative care encounter   . Encounter for monitoring opioid maintenance therapy 11/19/2018  . Adrenal insufficiency ([place]) 09/03/2018  . Nausea vomiting and diarrhea   . Chronic diarrhea 06/25/2018  . Chronic anticoagulation 06/25/2018  . Vomiting   . Encounter for antineoplastic chemotherapy 06/04/2018  . 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 Lung metastasis ([place]) 05/15/2018  . Closed fracture of distal end of radius 05/04/2018  . History of total knee arthroplasty 04/24/2018  . Osteoarthritis of left knee 02/28/2018  . Condyloma acuminatum of anus s/p ablation 02/22/2018 02/22/2018  . Condyloma acuminatum of vagina s/p ablation 02/22/2018 02/22/2018  . Positive ANA (antinuclear antibody) 02/04/2018  . Aortic atherosclerosis ([place]) 01/15/2018  . Elevated MCV 01/15/2018  . Anemia 01/15/2018  . Genital warts 01/15/2018  . Vitamin D deficiency 01/15/2018  . Pernicious anemia   . Impingement syndrome of shoulder region 12/19/2017  . Multiple joint pain 12/19/2017  . Osteoarthritis of right knee 12/19/2017  . B12 deficiency 12/11/2017  . Lung nodules 12/11/2017  . History of Clostridium difficile infection 10/23/2017  . Factor V Leiden ([place]) 10/19/2017  . Goals of care, counseling/discussion 10/19/2017  . Cervical cancer, FIGO stage IB1 ([place]) 09/18/2017  . Chronic hepatitis C without hepatic  coma ([place]) 09/18/2017  . Cytopenia 09/18/2017  . Diarrhea 09/18/2017  . Elevated LFTs 09/18/2017  . Lower abdominal pain 09/18/2017  . 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 Malignant neoplasm of overlapping sites of cervix ([place]) 09/18/2017  . Renal insufficiency 09/18/2017  . Wound infection after surgery 09/12/2017  . Hypokalemia   . Hypomagnesemia   . Cervical arthritis 07/18/2017  . Dysuria 06/20/2017  . Metastasis from cervical cancer ([place]) 05/12/2017  . Essential hypertension 03/15/2017  . Anemia of chronic disease 03/01/2017  . Chemotherapy-induced neutropenia ([place]) 01/29/2017  . 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[place]) 09/25/2016   PCP:  Towanda Malkin, MD Pharmacy:   [facility], [place] [address] Phone: [phone] Fax: [phone]  [facility] [address]), [place] [address] Phone: [phone] Fax: [phone]     Social Determinants of Health (SDOH) Interventions    Readmission Risk Interventions Readmission Risk Prevention Plan 12/30/2019 09/26/2019 07/24/2019  Transportation Screening Complete Complete Complete  Medication Review (RN Care Manager) Complete Complete Referral to Pharmacy  PCP or Specialist appointment within 3-5 days of discharge Complete - Complete  HRI or Home Care Consult Complete - Complete  SW Recovery Care/Counseling Consult Complete - Complete  Palliative Care Screening Not Applicable - Not [place] - Not Applicable Not Applicable  Some recent data  might be hidden

## 2020-03-12 NOTE — Progress Notes (Addendum)
[facility]) Hospital Liaison RN note  Ms. Mussell is currently followed by Manufacturing engineer for community based Palliative Care.  Will follow for disposition.  Please call with any questions or concerns.  Thank you. Margaretmary Eddy, BSN, [place] [phone]

## 2020-03-12 NOTE — Progress Notes (Signed)
PROGRESS NOTE    Joanna Hall  CHE:[id number] DOB: [birth year] DOA: 03/11/2020 PCP: Towanda Malkin, MD    Assessment & Plan:   Active Problems:   Cancer associated pain    Joanna Hall is a 54 y.o. 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  No diarrhea since presentation. --d/c GI precaution. --Hold abx for now  # Elevated BP, improved --2/2 to pain.  Improved with pain management.  Pt is not on home BP meds.  # factor V Leiden mutation with history of blood clots  --continue home Xarelto  # Hypokalemia and hypomag --likely due to vomiting --Replete PRN  # GERD --continue home PPI  # Depression --continue home Elavil   DVT prophylaxis: [id number]  Code Status: DNR  Family Communication:  Status is: inpatient Dispo:   The patient is from: home Anticipated d/c is to: home with home hospice Anticipated d/c date is: tomorrow Patient currently is not medically stable to d/c due to: cancer pain management involving IV pain meds.   Subjective and Interval History:  Pt reported pain and nausea much improved on current pain regimen.  Able to eat and drink.  No fever, dyspnea,  dysuria.  No more diarrhea since presentation.   Objective: Vitals:   03/11/20 1742 03/12/20 0002 03/12/20 0827 03/12/20 1659  BP: (!) 156/96 (!) 165/91 (!) 151/95 (!) 124/98  Pulse: 81 65 95 94  Resp:   18 18  Temp: 98 F (36.7 C) 97.6 F (36.4 C) 98.4 F (36.9 C) 98.1 F (36.7 C)  TempSrc: Oral Oral Oral Oral  SpO2: 100% 100% 100% 100%  Weight:      Height:        Intake/Output Summary (Last 24 hours) at 03/12/2020 1850 Last data filed at 03/12/2020 1501 Gross per 24 hour  Intake 580 ml  Output --  Net 580 ml   Filed Weights   03/11/20 0837  Weight: 66.9 kg    Examination:   Constitutional: NAD, AAOx3 HEENT: conjunctivae and lids normal, EOMI CV: RRR no M,R,G. Distal pulses +2.  No cyanosis.   RESP: CTA B/L, normal respiratory effort  GI: +BS, NTND Extremities: No effusions, edema, or tenderness in BLE SKIN: warm, dry and intact Neuro: II - XII grossly intact.  Sensation intact Psych: Normal mood and affect.     Data Reviewed: I have personally reviewed following labs and imaging studies  CBC: Recent Labs  Lab 03/11/20 0859 03/12/20 0511  WBC 5.9 4.0  HGB 12.2 11.7*  HCT 37.4 34.2*  MCV 94.7 91.4  PLT 198 400   Basic Metabolic Panel: Recent Labs  Lab 03/11/20 0859 03/12/20 0511  NA 136 137  K 3.3* 4.0  CL 102 103  CO2 25 27  GLUCOSE 110*  116*  BUN 9 9  CREATININE 0.54 0.62  CALCIUM 8.9 9.1  MG  --  1.3*   GFR: Estimated Creatinine Clearance: 83 mL/min (by C-G formula based on SCr of 0.62 mg/dL). Liver Function Tests: Recent Labs  Lab 03/11/20 0859  AST 69*  ALT 58*  ALKPHOS 260*  BILITOT 0.7  PROT 7.1  ALBUMIN 3.4*   Recent Labs  Lab 03/11/20 0859  LIPASE 25   No results for input(s): AMMONIA in the last 168 hours. Coagulation Profile: No results for input(s): INR, PROTIME in the last 168 hours. Cardiac Enzymes: No results for input(s): CKTOTAL, CKMB, CKMBINDEX, TROPONINI in the last 168 hours. BNP (last 3 results) No  results for input(s): PROBNP in the last 8760 hours. HbA1C: No results for input(s): HGBA1C in the last 72 hours. CBG: No results for input(s): GLUCAP in the last 168 hours. Lipid Profile: No results for input(s): CHOL, HDL, LDLCALC, TRIG, CHOLHDL, LDLDIRECT in the last 72 hours. Thyroid Function Tests: No results for input(s): TSH, T4TOTAL, FREET4, T3FREE, THYROIDAB in the last 72 hours. Anemia Panel: No results for input(s): VITAMINB12, FOLATE, FERRITIN, TIBC, IRON, RETICCTPCT in the last 72 hours. Sepsis Labs: No results for input(s): PROCALCITON, LATICACIDVEN in the last 168 hours.  Recent Results (from the past 240 hour(s))  SARS Coronavirus 2 by RT PCR (hospital order, performed in [facility] hospital lab) Nasopharyngeal Nasopharyngeal Swab     Status: None   Collection Time: 03/11/20  3:29 PM   Specimen: Nasopharyngeal Swab  Result Value Ref Range Status   SARS Coronavirus 2 NEGATIVE NEGATIVE Final    Comment: (NOTE) SARS-CoV-2 target nucleic acids are NOT DETECTED. The SARS-CoV-2 RNA is generally detectable in upper and lower respiratory specimens during the acute phase of infection. The lowest concentration of SARS-CoV-2 viral copies this assay can detect is 250 copies / mL. A negative result does not preclude SARS-CoV-2 infection and should not be used as the sole basis for treatment or other patient management decisions.  A negative result may occur with improper specimen collection / handling, submission of specimen other than nasopharyngeal swab, presence of viral mutation(s) within the areas targeted by this assay, and inadequate number of viral copies (<250 copies / mL). A negative result must be combined with clinical observations, patient history, and epidemiological information. Fact Sheet for Patients:   StrictlyIdeas.no Fact Sheet for Healthcare Providers: BankingDealers.co.za This test is not yet approved or  cleared  by the [place] FDA and has been authorized for detection and/or diagnosis of SARS-CoV-2 by FDA under an Emergency Use Authorization (EUA).  This EUA will remain in effect (meaning this test can be used) for the duration of the COVID-19 declaration under Section 564(b)(1) of the Act, 21 U.S.C. section 360bbb-3(b)(1), unless the authorization is terminated or revoked sooner. Performed at [facility], [address] [place], [place] [address]       Radiology Studies: CT ABDOMEN PELVIS W CONTRAST  Result Date: 03/11/2020 CLINICAL DATA:  Abdominal pain. Reported metastatic cervical carcinoma. EXAM: CT ABDOMEN AND PELVIS WITH CONTRAST TECHNIQUE: Multidetector CT imaging of the abdomen and pelvis was performed using the standard protocol following bolus administration of intravenous contrast. CONTRAST:  32m OMNIPAQUE IOHEXOL 300 MG/ML  SOLN COMPARISON:  Feb 29, 2020 and January 14, 2020 FINDINGS: Lower chest: There are multiple pulmonary nodular lesions throughout the lung bases, many of which are cavitary. These lesions appear stable and range in size from as small as 4 mm to as  large as 1.2 cm. No lung base edema or consolidation. Hepatobiliary: There is hepatic steatosis. No focal liver lesions are demonstrable. Gallbladder is absent. There is dilatation of the common hepatic and common bile ducts measuring up to 14 mm. There is tapering of the common bile duct at the level of the ampulla. No obstructing lesion is seen by CT. There is slight intrahepatic biliary duct dilatation. Pancreas: No pancreatic mass or inflammatory focus. Spleen: No splenic lesions are evident. Adrenals/Urinary Tract: Adrenals bilaterally appear normal. Kidneys bilaterally show no evident mass or hydronephrosis on either side. There is a 1 mm nonobstructing calculus in the lower pole of the right kidney. There is a 1 mm calculus in the mid left kidney. There is no evident ureteral calculus on either  side. No convincing ureteral lesion on current examination. Urinary bladder wall thickness is within normal limits. Stomach/Bowel: Status post right hemicolectomy with anastomosis patent. There is subtle thickening of the wall of the sigmoid colon without diverticular disease evident. This area may represent a degree of colitis. There is mild enhancement of the wall of the distal ileum which may indicate a degree of enteritis. There is no evident bowel obstruction. No free air or portal venous air. Vascular/Lymphatic: No abdominal aortic aneurysm. There is aortic and common iliac artery atherosclerosis. No appreciable adenopathy in the abdomen or pelvis. Reproductive: Uterus is anteverted. No pelvic mass evident currently. Question edema in the upper vagina/cervical region. Other: No abscess or ascites evident in the abdomen or pelvis. Musculoskeletal: Apparent sacral insufficiency fractures are again noted there is degenerative change in the lumbar spine. Sclerosis is again noted in the medial superior pubic ramus. No destructive bone lesions evident. No intramuscular lesions. IMPRESSION: 1. 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 It should be noted that septic emboli could present similarly. 2. Suspect a degree of sigmoid colitis with somewhat ill-defined bowel wall in this area. Question enteritis in the distal ileum with mild enhancement of the wall in this area. Bowel otherwise appears unremarkable. Postoperative change in the right colon with evidence of previous hemicolectomy. Anastomosis patent in this area. 3.  Apparent edema in the upper vagina and cervix region. 4. Small calculi in each kidney. No hydronephrosis on either side. No ureteral calculi. No ureteral lesions seen on this study. 5. Gallbladder absent. Persistent biliary duct dilatation with a 14 mm common bile duct. No obstructing focus evident in this area. Etiology for this bowel dilatation uncertain. MRCP would be the optimum  imaging study of choice to further evaluate this region. 6. Insufficiency fractures in the sacrum. Question radiation therapy change in the medial left superior pubic ramus. Appearance stable in this area. 7.  Diffuse hepatic steatosis. Electronically Signed   By: Lowella Grip III M.D.   On: 03/11/2020 14:34     Scheduled Meds:  amitriptyline  100 mg Oral QHS   Chlorhexidine Gluconate Cloth  6 each Topical Daily   feeding supplement  1 Container Oral BID WC   feeding supplement (ENSURE ENLIVE)  237 mL Oral BID BM   hyoscyamine  0.375 mg Oral Q12H   oxyCODONE  60 mg Oral Q12H   pantoprazole  40 mg Oral Daily   rivaroxaban  20 mg Oral Q supper   Continuous Infusions:   LOS: 1 day     Enzo Bi, MD [facility] If 7PM-7AM, please contact night-coverage 03/12/2020, 6:50 PM

## 2020-03-12 NOTE — Consult Note (Signed)
[facility]  [phone] [phone]   Name: Joanna Hall Date: 03/12/2020 MRN: [id number]  DOB: 04/29/1966  Patient Care Team: Towanda Malkin, MD as PCP - General (Internal Medicine) Mellody Drown, MD as Consulting Physician (Obstetrics and Gynecology) [name], MD as Consulting Physician (Gastroenterology) Michael Boston, MD as Consulting Physician (General Surgery) Lovell Sheehan, MD as Consulting Physician (Orthopedic Surgery) Cammie Sickle, MD as Consulting Physician (Oncology) Earnestine Leys, MD as Consulting Physician (Orthopedic Surgery) Jason Coop, NP as Nurse Practitioner Burleigh Brockmann, Kirt Boys, NP as Nurse Practitioner (Hospice and Palliative Medicine)    REASON FOR CONSULTATION: Joanna Hall is a 54 y.o. 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 Patient underwent right total knee arthroplasty on 12/24/2019. She was subsequently readmitted 12/29/2019-01/02/2020 with right knee pain and swelling. CT revealed a large suprapatellar knee joint effusion with internal hyperdense contents suggesting hemarthrosis likely secondary to anticoagulation. Anticoagulation was discontinued and patient had an IVC filter placed. IVC filter was later removed on 02/23/2020.  Patienthas multiple recent ER visits for nausea and abdominal pain.She was readmitted on 03/11/2020 for same.Palliative care was consulted to help address symptoms and goals..   SOCIAL HISTORY:     reports that she quit smoking about 13 years ago. Her smoking use included cigarettes. She has a 2.50 pack-year smoking  history. She has never used smokeless tobacco. She reports previous alcohol use. She reports current drug use. Drug: Marijuana.   Patient is not married. She lives at home with her mother and father. She has an adult daughter who lives in [place]. She had another daughter who is now deceased.  ADVANCE DIRECTIVES:  DNR on file  CODE STATUS: DNR  PAST MEDICAL HISTORY: Past Medical History:  Diagnosis Date  . Abdominal pain 06/10/2018  . Abnormal cervical Papanicolaou smear 09/18/2017  . Anxiety   . Aortic atherosclerosis ([place])   . Arthritis    neck and knees  . Blood clots in brain    both lungs and right kidney  . Blood transfusion without reported diagnosis   . Cervical cancer ([place]) 09/2016   mets lung  . Chronic anal fissure   . Chronic diarrhea   . Dyspnea   . Erosive gastropathy 09/18/2017  . Factor V Leiden mutation ([place])   . Fecal incontinence   . Genital warts   . GERD (gastroesophageal reflux disease)   . GI bleed 10/08/2018  . Heart murmur   . Hematochezia   . Hemorrhoids   . 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 Hepatitis, chronic ([place]) 05/05/2017  . History of cancer chemotherapy    completed 06/2017  . History of Clostridium difficile infection    while undergoing chemo.  Negative test 10/2017  . Ileocolic anastomotic leak   . 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 Intestinal infection due to Clostridium difficile 09/18/2017  . Lung cancer ([place])   . Macrocytic anemia with vitamin B12 deficiency   . Multiple gastric ulcers   . Nausea vomiting and diarrhea   . Pancolitis ([place]) 07/27/2018  . Perianal condylomata   . Pneumonia    History  of  . Pulmonary nodules   . Rectal bleeding   . Small bowel obstruction ([place]) 08/2017  . Stiff neck    limited right turn  . Vitamin D deficiency     PAST SURGICAL HISTORY:  Past Surgical History:  Procedure Laterality Date  . CHOLECYSTECTOMY    . COLON  SURGERY  08/2017   resection  . COLONOSCOPY WITH PROPOFOL N/A 12/20/2017   Procedure: COLONOSCOPY WITH PROPOFOL;  Surgeon: [name], MD;  Location: [facility] ENDOSCOPY;  Service: Gastroenterology;  Laterality: N/A;  . COLONOSCOPY WITH PROPOFOL N/A 07/30/2018   Procedure: COLONOSCOPY WITH PROPOFOL;  Surgeon: [name], MD;  Location: [facility] ENDOSCOPY;  Service: Gastroenterology;  Laterality: N/A;  . COLONOSCOPY WITH PROPOFOL N/A 10/10/2018   Procedure: COLONOSCOPY WITH PROPOFOL;  Surgeon: Lucilla Lame, MD;  Location: [facility] ENDOSCOPY;  Service: Endoscopy;  Laterality: N/A;  . DIAGNOSTIC LAPAROSCOPY    . ESOPHAGOGASTRODUODENOSCOPY (EGD) WITH PROPOFOL N/A 12/20/2017   Procedure: ESOPHAGOGASTRODUODENOSCOPY (EGD) WITH PROPOFOL;  Surgeon: [name], MD;  Location: [place];  Service: Gastroenterology;  Laterality: N/A;  . ESOPHAGOGASTRODUODENOSCOPY (EGD) WITH PROPOFOL  07/30/2018   Procedure: ESOPHAGOGASTRODUODENOSCOPY (EGD) WITH PROPOFOL;  Surgeon: [name], MD;  Location: [facility] ENDOSCOPY;  Service: Gastroenterology;;  . Otho Darner SIGMOIDOSCOPY N/A 11/21/2018   Procedure: FLEXIBLE SIGMOIDOSCOPY;  Surgeon: [name], MD;  Location: [facility] ENDOSCOPY;  Service: Gastroenterology;  Laterality: N/A;  . IVC FILTER INSERTION N/A 01/01/2020   Procedure: IVC FILTER INSERTION;  Surgeon: Algernon Huxley, MD;  Location: [place] CV LAB;  Service: Cardiovascular;  Laterality: N/A;  . IVC FILTER REMOVAL N/A 02/23/2020   Procedure: IVC FILTER REMOVAL;  Surgeon: Algernon Huxley, MD;  Location: [place] CV LAB;  Service: Cardiovascular;  Laterality: N/A;  . LAPAROTOMY N/A 08/31/2017   Procedure: EXPLORATORY LAPAROTOMY for SBO, ileocolectomy, removal of piece of uterine wall;  Surgeon: [name], MD;  Location: [facility] ORS;  Service: General;  Laterality: N/A;  . LASER ABLATION CONDOLAMATA N/A 02/22/2018   Procedure: LASER ABLATION/REMOVAL OF VBTYOMAYOKH AROUND ANUS AND VAGINA;   Surgeon: Michael Boston, MD;  Location: [place];  Service: General;  Laterality: N/A;  . OOPHORECTOMY    . PORTA CATH INSERTION N/A 05/13/2018   Procedure: PORTA CATH INSERTION;  Surgeon: Katha Cabal, MD;  Location: [place] CV LAB;  Service: Cardiovascular;  Laterality: N/A;  . SMALL INTESTINE SURGERY    . TANDEM RING INSERTION     x3  . THORACOTOMY    . TOTAL KNEE ARTHROPLASTY Left 04/24/2018   Procedure: TOTAL KNEE ARTHROPLASTY;  Surgeon: Lovell Sheehan, MD;  Location: [facility] ORS;  Service: Orthopedics;  Laterality: Left;  . TOTAL KNEE ARTHROPLASTY Right 12/24/2019   Procedure: RIGHT TOTAL KNEE ARTHROPLASTY;  Surgeon: Lovell Sheehan, MD;  Location: [facility] ORS;  Service: Orthopedics;  Laterality: Right;    HEMATOLOGY/ONCOLOGY HISTORY:  Oncology History Overview Note  # dec 2017- CERVICAL ADENO CA [[place]]; Stage IB [Dr. Christene Slates at [facility] in [place], [place];No surgery- Chemo-RT;   # 2018-sep - lung nodules [in [place]]  # AUG 20th, 2019-#1 Carbo-Taxol s/p 6 cycles- [No avastin sec to blood clots]: Feb 2020-bilateral lung nodules with 1 cm in size. March 2020- finished carbo-taxol #7; poor tolerance to [place].  # April 15 th 2020- Start Taxol weekly x 3; one week OFF;  # July 2020-CT- bil cavitary lesions- slightly bigger by few mm/overall STABLE; CT a/p-NED. STOP Taxol;  #  July , 23rd 2020- Keytruda [CPS-15 ]x 4 cycles; CT mid OCT 2020-progressive disease in the lungs.;  Stop [facility]  # Oct 22nd Alimta q 3 w  # summer-/fall 2019-Bil PE/kidney infract; factor V Leiden Leiden - xarelto [small bowel obstruction]; moved to [facility]   #Fall 2019 PE/renal infarct on Xarelto-factor V Leiden  # NGS/[facility]- PDL CPS 15; NEG for other targets**  ------------------------------------------------------------- She was treated by  Decision was made to pursue concurrent chemotherapy (weekly cisplatin) and radiation.  She  received treatment from 11/2016-05/2017.  01/2017 cisplatin x 2 and carboplatin x 1 (01/29/2017) due to ARF and XRT.  XRT was followed by T & O on 02/01/2017 and T & N 02/10/2017 and 02/20/2017.  Course was complicated by 80 pound weight loss, nausea, vomiting, electrolyte wasting (potassium and magnesium).  She describes that.  Is been sick constantly requiring at least 20 hospitalizations.  Follow-up CT chest and PET on 06/2017. Per patient, 'radiation worked' and no disease in the abdomen.  At that time she was noted to have lung nodules that were growing and follow-up imaging was scheduled for 10/2017.  She was admitted to hospital in [place] for small bowel obstruction which was managed conservatively and she was home for a week prior to traveling to [place] for Thanksgiving holiday where she has family.  She presented to ER in [place] on 08/2017 with nausea, vomiting, and lower abdominal pain.  Symptoms did not respond to conservative treatment.   CT on 08/26/2017 revealed small bowel obstruction with transition in the pelvis just superior to the uterus rather was a long segment of distal ileum with fatty wall thickening compatible with chronic inflammation and/or radiation enteritis. Imaging showed numerous pulmonary nodules consistent with metastatic disease. She underwent laparotomy and right ileocolectomy on 08/31/2017 at [facility].  Surgical findings revealed a thickened, matted, and scarred piece of distal small bowel close to the ileocecal valve.  She was discharged on 09/05/2017.  Pain markedly increased in intensity and imaging was performed on 09/11/2017 which revealed: Debris within the anterior abdominal wall incision concerning for infection versus packing material, s/p post ileo-colectomy with expected postoperative changes, mild colonic ileus, numerous pulmonary nodules highly concerning for metastatic disease, punctate nonobstructing nephrolithiasis.  Staples were removed and one  was packed.  She was started on doxycycline.  Abdominal and pelvic CT without contrast on 09/11/2017 revealed debris within anterior abdominal wall incision concerning for infection, versus packing material.   She is s/p ileocolectomy with expected postoperative changes and mild colonic ileus.  There were numerous pulmonary nodules highly concerning for metastatic disease and punctate nonobstructing nephrolithiasis. She was readmitted on 09/12/2017.  She describes the onset of lower abdominal pain on 09/09/2017.  Pain markedly increased in intensity on 09/11/2017.   Staples were removed and the wound packed.  She was started on doxycycline.  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   PET scan was ordered to evaluate enlarging lung nodules with concern for recurrent cervical cancer but scan was delayed due to insurance and need to be performed in [place].  Presented to ER on 12/03/2017 for abdominal pain and emesis.  Imaging concerning for worsening possible uterine infarct and she was admitted to hospital.  Pelvic MRI was unremarkable.  Remote scarring type changes of uterus thought to be possibly related to radiation.  She was discharged on 12/08/2017.  Underwent endoscopy and colonoscopy on 12/20/2017.    On 02/22/2018 she underwent laser ablation of condylomata around the anus and vagina under anesthesia with Dr. Johney Maine.   04/15/2018:  Chest, abdomen, and pelvis CT revealed innumerable (> 100) cavitary nodules scattered in the lungs, moderately enlarging compared to the 11/08/2017 PET-CT, suspicious for metastatic  disease.  One index node in the RLL measures 1.0 x 1.1 cm (previously 0.6 x 0.6 cm).  There were no new nodules.  There was an ill-defined wall thickening in the rectosigmoid with surrounding stranding along fascia planes, probably sequela from prior radiation therapy.  There was multilevel lumbar impingement due to spondylosis and degenerative disc disease.  There was heterogeneous enhancement in the uterus (some possibly from prior radiation therapy).   04/23/2018:  PET scan revealed numerous scattered solid and cavitary nodules in the lungs stable increased in size compared to the prior PET-CT from 11/08/2017. Largest nodule was 1.1 cm in the LUL (SUV 1.9).  These demonstrated low-grade metabolic activity up to a maximum SUV of about 2.3, increased from 11/08/2017.    Case was discussed at tumor board on 04/25/2018. Consensus to pursue CT-guided biopsy (05/06/18) which revealed: Metastatic adenocarcinoma, morphologically consistent with cervical adenocarcinoma.  She has history of chronic hepatitis C which is managed by GI.  Hepatitis C genotype is 2a/2c.  She receives B12 injections for history of B12 deficiency.  On 04/24/2018 she underwent left total knee replacement with Dr. [name]. --------------------------------------------------------   DIAGNOSIS: Cervical adenocarcinoma  STAGE: IV        ;GOALS: Palliative  CURRENT/MOST RECENT THERAPY : Alimta [C]    Malignant neoplasm of overlapping sites of cervix ([place])  01/29/2019 - 04/23/2019 Chemotherapy   The patient had PACLitaxel (TAXOL) 156 mg in sodium chloride 0.9 % 250 mL chemo infusion (</= 59m/m2), 80 mg/m2 = 156 mg, Intravenous,  Once, 3 of 4 cycles Dose modification: 65 mg/m2 (original dose 80 mg/m2, Cycle 1, Reason: Provider Judgment) Administration: 156 mg (01/29/2019), 156 mg (02/05/2019), 126 mg (02/12/2019), 126 mg (02/26/2019), 126 mg (03/13/2019), 126 mg (03/27/2019), 126 mg (03/20/2019), 126 mg (04/03/2019), 126 mg (04/10/2019)  for  chemotherapy treatment.    05/08/2019 - 07/30/2019 Chemotherapy   The patient had pembrolizumab (KEYTRUDA) 200 mg in sodium chloride 0.9 % 50 mL chemo infusion, 200 mg, Intravenous, Once, 4 of 6 cycles Administration: 200 mg (05/08/2019), 200 mg (05/29/2019), 200 mg (06/19/2019), 200 mg (07/10/2019)  for chemotherapy treatment.    08/07/2019 -  Chemotherapy   The patient had pegfilgrastim-cbqv ([facility] injection 6 mg, 6 mg, Subcutaneous, Once, 3 of 5 cycles Administration: 6 mg (08/29/2019), 6 mg (09/19/2019) PEMEtrexed (ALIMTA) 1,000 mg in sodium chloride 0.9 % 100 mL chemo infusion, 950 mg, Intravenous,  Once, 4 of 6 cycles Administration: 1,000 mg (08/07/2019), 1,000 mg (09/18/2019), 1,000 mg (11/13/2019), 1,000 mg (08/28/2019)  for chemotherapy treatment.      ALLERGIES:  is allergic to ketamine.  MEDICATIONS:  Current Facility-Administered Medications  Medication Dose Route Frequency Provider Last Rate Last Admin  . alum & mag hydroxide-simeth (MAALOX/MYLANTA) 200-200-20 MG/5ML suspension 15 mL  15 mL Oral Q6H PRN LEnzo Bi MD      . amitriptyline (ELAVIL) tablet 100 mg  100 mg Oral QHS LEnzo Bi MD   100  mg at 03/11/20 2032  . calcium carbonate (TUMS - dosed in mg elemental calcium) chewable tablet 200 mg of elemental calcium  1 tablet Oral TID PRN Enzo Bi, MD      . Chlorhexidine Gluconate Cloth 2 % PADS 6 each  6 each Topical Daily Enzo Bi, MD   6 each at 03/12/20 [phone]  . docusate sodium (COLACE) capsule 100 mg  100 mg Oral BID PRN Enzo Bi, MD      . guaiFENesin-dextromethorphan [facility] DM) 100-10 MG/5ML syrup 10 mL  10 mL Oral Q6H PRN Enzo Bi, MD      . HYDROmorphone (DILAUDID) injection 1 mg  1 mg Intravenous Q2H PRN Enzo Bi, MD   1 mg at 03/12/20 7169  . hydrOXYzine (VISTARIL) capsule 25 mg  25 mg Oral TID PRN Enzo Bi, MD      . hyoscyamine (LEVBID) 0.375 MG 12 hr tablet 0.375 mg  0.375 mg Oral Q12H Enzo Bi, MD   0.375 mg at 03/12/20 0736  . magnesium sulfate IVPB  2 g 50 mL  2 g Intravenous Einar Gip, MD 50 mL/hr at 03/12/20 0838 2 g at 03/12/20 6789  . ondansetron (ZOFRAN) injection 4 mg  4 mg Intravenous Q6H PRN Enzo Bi, MD   4 mg at 03/12/20 0501  . ondansetron (ZOFRAN-ODT) disintegrating tablet 4 mg  4 mg Oral Q8H PRN Enzo Bi, MD   4 mg at 03/11/20 2033  . oxyCODONE (Oxy IR/ROXICODONE) immediate release tablet 15-30 mg  15-30 mg Oral Q3H PRN Swathi Dauphin, Kirt Boys, NP      . oxyCODONE (OXYCONTIN) 12 hr tablet 60 mg  60 mg Oral Q12H G[place]oyn For[place], Kirt Boys, NP      . pantoprazole (PROTONIX) EC tablet 40 mg  40 mg Oral Daily Enzo Bi, MD   40 mg at 03/12/20 0735  . polyethylene glycol (MIRALAX / GLYCOLAX) packet 17 g  17 g Oral BID PRN Enzo Bi, MD      . promethazine (PHENERGAN) injection 12.5 mg  12.5 mg Intravenous Q6H PRN Enzo Bi, MD   12.5 mg at 03/12/20 0839  . rivaroxaban (XARELTO) tablet 20 mg  20 mg Oral Q supper Enzo Bi, MD   20 mg at 03/11/20 2033   Facility-Administered Medications Ordered in Other Encounters  Medication Dose Route Frequency Provider Last Rate Last Admin  . heparin lock flush 100 unit/mL  500 Units Intravenous Once Corcoran, Melissa C, MD      . sodium chloride 0.9 % 1,000 mL with potassium chloride 20 mEq infusion   Intravenous Once Honor Loh E, NP      . sodium chloride flush (NS) 0.9 % injection 10 mL  10 mL Intravenous Once [place] Shawgo, Kirt Boys, NP        VITAL SIGNS: BP (!) 151/95 (BP Location: Left Arm)   Pulse 95   Temp 98.4 F (36.9 C) (Oral)   Resp 18   Ht 5' 8"  (1.727 m)   Wt 147 lb 7.8 oz (66.9 kg)   SpO2 100%   BMI 22.43 kg/m  Filed Weights   03/11/20 0837  Weight: 147 lb 7.8 oz (66.9 kg)    Estimated body mass index is 22.43 kg/m as calculated from the following:   Height as of this encounter: 5' 8"  (1.727 m).   Weight as of this encounter: 147 lb 7.8 oz (66.9 kg).  LABS: CBC:    Component Value Date/Time   WBC 4.0 03/12/2020 0511   HGB 11.7 (  L) 03/12/2020 0511   HCT 34.2 (L) 03/12/2020  0511   PLT 179 03/12/2020 0511   MCV 91.4 03/12/2020 0511   NEUTROABS 2.0 03/05/2020 0919   LYMPHSABS 1.1 03/05/2020 0919   MONOABS 0.3 03/05/2020 0919   EOSABS 0.1 03/05/2020 0919   BASOSABS 0.0 03/05/2020 0919   Comprehensive Metabolic Panel:    Component Value Date/Time   NA 137 03/12/2020 0511   K 4.0 03/12/2020 0511   CL 103 03/12/2020 0511   CO2 27 03/12/2020 0511   BUN 9 03/12/2020 0511   CREATININE 0.62 03/12/2020 0511   GLUCOSE 116 (H) 03/12/2020 0511   CALCIUM 9.1 03/12/2020 0511   AST 69 (H) 03/11/2020 0859   ALT 58 (H) 03/11/2020 0859   ALKPHOS 260 (H) 03/11/2020 0859   BILITOT 0.7 03/11/2020 0859   PROT 7.1 03/11/2020 0859   ALBUMIN 3.4 (L) 03/11/2020 0859    RADIOGRAPHIC STUDIES: CT ABDOMEN PELVIS W CONTRAST  Result Date: 03/11/2020 CLINICAL DATA:  Abdominal pain. Reported metastatic cervical carcinoma. EXAM: CT ABDOMEN AND PELVIS WITH CONTRAST TECHNIQUE: Multidetector CT imaging of the abdomen and pelvis was performed using the standard protocol following bolus administration of intravenous contrast. CONTRAST:  76m OMNIPAQUE IOHEXOL 300 MG/ML  SOLN COMPARISON:  Feb 29, 2020 and January 14, 2020 FINDINGS: Lower chest: There are multiple pulmonary nodular lesions throughout the lung bases, many of which are cavitary. These lesions appear stable and range in size from as small as 4 mm to as large as 1.2 cm. No lung base edema or consolidation. Hepatobiliary: There is hepatic steatosis. No focal liver lesions are demonstrable. Gallbladder is absent. There is dilatation of the common hepatic and common bile ducts measuring up to 14 mm. There is tapering of the common bile duct at the level of the ampulla. No obstructing lesion is seen by CT. There is slight intrahepatic biliary duct dilatation. Pancreas: No pancreatic mass or inflammatory focus. Spleen: No splenic lesions are evident. Adrenals/Urinary Tract: Adrenals bilaterally appear normal. Kidneys bilaterally show no  evident mass or hydronephrosis on either side. There is a 1 mm nonobstructing calculus in the lower pole of the right kidney. There is a 1 mm calculus in the mid left kidney. There is no evident ureteral calculus on either side. No convincing ureteral lesion on current examination. Urinary bladder wall thickness is within normal limits. Stomach/Bowel: Status post right hemicolectomy with anastomosis patent. There is subtle thickening of the wall of the sigmoid colon without diverticular disease evident. This area may represent a degree of colitis. There is mild enhancement of the wall of the distal ileum which may indicate a degree of enteritis. There is no evident bowel obstruction. No free air or portal venous air. Vascular/Lymphatic: No abdominal aortic aneurysm. There is aortic and common iliac artery atherosclerosis. No appreciable adenopathy in the abdomen or pelvis. Reproductive: Uterus is anteverted. No pelvic mass evident currently. Question edema in the upper vagina/cervical region. Other: No abscess or ascites evident in the abdomen or pelvis. Musculoskeletal: Apparent sacral insufficiency fractures are again noted there is degenerative change in the lumbar spine. Sclerosis is again noted in the medial superior pubic ramus. No destructive bone lesions evident. No intramuscular lesions. IMPRESSION: 1. 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 It should be noted that septic emboli could present similarly. 2. Suspect a degree of sigmoid colitis with somewhat ill-defined bowel wall in this area. Question enteritis in the distal ileum with mild enhancement of the wall in this area. Bowel otherwise  appears unremarkable. Postoperative change in the right colon with evidence of previous hemicolectomy. Anastomosis patent in this area. 3.  Apparent edema in the upper vagina and cervix region. 4. Small calculi in each kidney. No hydronephrosis on either side. No ureteral calculi. No ureteral lesions  seen on this study. 5. Gallbladder absent. Persistent biliary duct dilatation with a 14 mm common bile duct. No obstructing focus evident in this area. Etiology for this bowel dilatation uncertain. MRCP would be the optimum imaging study of choice to further evaluate this region. 6. Insufficiency fractures in the sacrum. Question radiation therapy change in the medial left superior pubic ramus. Appearance stable in this area. 7.  Diffuse hepatic steatosis. Electronically Signed   By: Lowella Grip III M.D.   On: 03/11/2020 14:34   CT ABDOMEN PELVIS W CONTRAST  Result Date: 02/29/2020 CLINICAL DATA:  Abdominal distension, generalized abdominal pain EXAM: CT ABDOMEN AND PELVIS WITH CONTRAST TECHNIQUE: Multidetector CT imaging of the abdomen and pelvis was performed using the standard protocol following bolus administration of intravenous contrast. CONTRAST:  126m OMNIPAQUE IOHEXOL 300 MG/ML  SOLN COMPARISON:  01/14/2020 FINDINGS: Lower chest: Numerous nodules again identified within the bilateral lung bases, many of which are cavitary. No appreciable interval change from previous CT. Hepatobiliary: Focal fatty infiltration adjacent to the falciform ligament. No suspicious hepatic lesion. Prior cholecystectomy. Similar degree of mild intrahepatic biliary dilatation. Stable dilation of the common bile duct measuring approximately 14 mm in diameter at the level of the head of the pancreas. Pancreas: Unremarkable. No pancreatic ductal dilatation or surrounding inflammatory changes. Spleen: Normal in size without focal abnormality. Adrenals/Urinary Tract: Unremarkable adrenal glands. Bilateral kidneys enhance symmetrically. Punctate 1-2 mm nonobstructing stone within the inferior pole of the right kidney. No suspicious renal lesion. No hydronephrosis. The ureters are nondilated. Previously described enhancing soft tissue in the region of the distal left ureter is not well seen on the current study given the lack  of distention of the left ureter. Urinary bladder is decompressed, limiting its evaluation. There may be mild urinary bladder wall thickening, slightly more pronounced on the left, similar to prior. Stomach/Bowel: Prior right hemicolectomy. Moderate volume of stool within the colon. No focal colonic wall thickening or pericolonic inflammatory changes. Stomach and small bowel appear within normal limits. No dilated loops of bowel to suggest obstruction. Vascular/Lymphatic: Abdominal aortic atherosclerosis without aneurysm. IVC filter has been removed. No abdominopelvic lymphadenopathy. Reproductive: Unchanged appearance of the uterus and cervix. No adnexal masses. Other: Unchanged stranding within the low pelvis, likely post treatment changes. No free fluid, free air, or abdominal wall hernia. Musculoskeletal: Chronic bilateral sacral insufficiency fractures, unchanged. Patchy sclerosis in the parasymphyseal left pubic bone, unchanged. Degenerative changes within the lower lumbar spine. IMPRESSION: 1. No acute abdominopelvic findings. 2. Previously seen left-sided hydronephrosis has resolved. 3. Numerous nodules again identified within the bilateral lung bases, many of which are cavitary. No appreciable interval change from previous CT. 4. Previously described enhancing soft tissue in the region of the distal left ureter is not well seen on the current study given the lack of distention of the left ureter. There may be mild urinary bladder wall thickening, slightly more pronounced on the left, similar to prior. 5. Chronic bilateral sacral insufficiency fractures, unchanged. Patchy sclerosis in the parasymphyseal left pubic bone, unchanged. Aortic Atherosclerosis (ICD10-I70.0). Electronically Signed   By: NDavina PokeD.O.   On: 02/29/2020 09:11   [place]  Result Date: 02/26/2020 CLINICAL DATA:  Acute generalized abdominal pain.  EXAM: RENAL / URINARY TRACT ULTRASOUND COMPLETE COMPARISON:  January 14, 2020.  FINDINGS: Right Kidney: Renal measurements: 11.5 x 4.7 x 3.8 cm = volume: 108 mL . Echogenicity within normal limits. No mass or hydronephrosis visualized. Left Kidney: Renal measurements: 11.3 x 5.0 x 4.4 cm = volume: 130 mL. Echogenicity within normal limits. No mass or hydronephrosis visualized. Bladder: Appears normal for degree of bladder distention. Other: None. IMPRESSION: Normal renal ultrasound. Electronically Signed   By: Marijo Conception M.D.   On: 02/26/2020 16:52   PERIPHERAL VASCULAR CATHETERIZATION  Result Date: 02/23/2020 See op note  VAS [place] LOWER EXTREMITY VENOUS (DVT)  Result Date: 02/17/2020  Lower Venous DVTStudy Other Indications: Hx PE. Risk Factors: Surgery IVC filter placed 01/01/2020. Performing Technologist: Concha [place] RVT  Examination Guidelines: A complete evaluation includes B-mode imaging, spectral Doppler, color Doppler, and power Doppler as needed of all accessible portions of each vessel. Bilateral testing is considered an integral part of a complete examination. Limited examinations for reoccurring indications may be performed as noted. The reflux portion of the exam is performed with the patient in reverse Trendelenburg.  +---------+---------------+---------+-----------+----------+--------------+ RIGHT    CompressibilityPhasicitySpontaneityPropertiesThrombus Aging +---------+---------------+---------+-----------+----------+--------------+ CFV      Full           Yes      Yes                                 +---------+---------------+---------+-----------+----------+--------------+ SFJ      Full           Yes      Yes                                 +---------+---------------+---------+-----------+----------+--------------+ FV Prox  Full           Yes      Yes                                 +---------+---------------+---------+-----------+----------+--------------+ FV Mid   Full           Yes      Yes                                  +---------+---------------+---------+-----------+----------+--------------+ FV DistalFull           Yes      Yes                                 +---------+---------------+---------+-----------+----------+--------------+ PFV      Full           Yes      Yes                                 +---------+---------------+---------+-----------+----------+--------------+ POP      Full           Yes      Yes                                 +---------+---------------+---------+-----------+----------+--------------+ PTV      Full  Yes      Yes                                 +---------+---------------+---------+-----------+----------+--------------+ GSV      Full           Yes      Yes                                 +---------+---------------+---------+-----------+----------+--------------+ SSV      Full           Yes      Yes                                 +---------+---------------+---------+-----------+----------+--------------+   +---------+---------------+---------+-----------+----------+--------------+ LEFT     CompressibilityPhasicitySpontaneityPropertiesThrombus Aging +---------+---------------+---------+-----------+----------+--------------+ CFV      Full           Yes      Yes                                 +---------+---------------+---------+-----------+----------+--------------+ SFJ      Full           Yes      Yes                                 +---------+---------------+---------+-----------+----------+--------------+ FV Prox  Full           Yes      Yes                                 +---------+---------------+---------+-----------+----------+--------------+ FV Mid   Full           Yes      Yes                                 +---------+---------------+---------+-----------+----------+--------------+ FV DistalFull           Yes      Yes                                  +---------+---------------+---------+-----------+----------+--------------+ PFV      Full           Yes      Yes                                 +---------+---------------+---------+-----------+----------+--------------+ POP      Full           Yes      Yes                                 +---------+---------------+---------+-----------+----------+--------------+ PTV      Full           Yes      Yes                                 +---------+---------------+---------+-----------+----------+--------------+  GSV      Full           Yes      Yes                                 +---------+---------------+---------+-----------+----------+--------------+ SSV      Full           Yes      Yes                                 +---------+---------------+---------+-----------+----------+--------------+     Summary: BILATERAL: - No evidence of deep vein thrombosis seen in the lower extremities, bilaterally. - No evidence of superficial venous thrombosis in the lower extremities, bilaterally.   *See table(s) above for measurements and observations. Electronically signed by Leotis Pain MD on 02/17/2020 at 10:08:31 AM.    Final     PERFORMANCE STATUS (ECOG) : 2 - Symptomatic, <50% confined to bed  Review of Systems Unless otherwise noted, a complete review of systems is negative.  Physical Exam General: NAD, frail appearing, thin Pulmonary: unlabored Extremities: surgical scars on knees, mild edema Skin: no rashes Neurological: Weakness but otherwise nonfocal  IMPRESSION: Patient reports that she feels better today and is in less pain.  She currently rates pain a 7 out of 10 but finds it tolerable.  Over past 24 hours, patient has received a total of 9 mg IV hydromorphone.  Total MME from hydromorphone is approximately 180 mg.  Patient says that she was not utilizing her OxyContin regularly at home prior to this admission.  She was using it more as needed.  Patient was using the  oxycodone IR 1 to 1-1/2 tablets every 3 hours around-the-clock.  I again discussed with her the importance of scheduled long-acting opioids to try to lessen the frequency of breakthrough pain.  We will plan to increase her OxyContin to 60 mg every 12.  Could then increase to every 8 hour dosing if needed.  We will add back oxycodone IR 15 to 30 mg every 3 hours as needed for breakthrough pain.  Try to limit her IV pain meds if possible.  Consider adding dexamethasone for colitis/pain if clinically prudent.  Patient says that she is decided to focus on quality of life over quantity.  She is not interested in further cancer treatment but will likely continue to need high levels of supportive care in the cancer center.  We discussed option of hospice involvement in detail and patient was in agreement.  She says that she would want to utilize a hospice IPU in the event of her end-of-life.  She also recognizes that she can call hospice and possibly utilize the [facility] for symptom management and lieu of ER or hospitalization.  OxyContin was last prescribed at 35m dose #90 on 5/22, so patient should have enough in the home for increased dose [place] may need Rx for oxycodone as this was last prescribed on 5/17 with #8 day supply.   PLAN: -Best supportive care -Increase OxyContin to 60 mg every 12 hours with consideration of further increase to every 8 hour dosing if needed -Resume oxycodone IR for breakthrough pain but increase dose to 15 to 30 mg every 3 hours -Limit IV pain meds if possible -Consider steroids for colitis/pain if clinically indicated -TOC consult  for help with discharge planning -Home with hospice when medically stable -DNR/DNI -Will see next week in the clinic   Time Total: 60 minutes  Visit consisted of counseling and education dealing with the complex and emotionally intense issues of symptom management and palliative care in the setting  of serious and potentially life-threatening illness.Greater than 50%  of this time was spent counseling and coordinating care related to the above assessment and plan.  Signed by: Altha Harm, PhD, NP-C

## 2020-03-12 NOTE — Progress Notes (Signed)
[place] [facility]) Hospital Liaison RN note:  Received request from Joanna Harm, NP and Joanna Hall, CM/SW for hospice services at home after discharge. Chart and patient information under review by [facility] physician. Hospice eligibility at this time.  Spoke with Joanna Hall in the room to initiate education related to hospice philosophy, services and to answer andy questions. Patient verbalized understanding of information given. Per discussion, the plan is for discharge home by private car tomorrow. 05.29.21  DME needs discussed. Patient already has a walker and a potty chair and states she does not need any further DME at this time. Address has been verified and is correct in the chart.   Please sent signed and completed DNR home with patient/family. Please provide prescriptions at discharge as needed to ensure ongoing symptom management.  [facility] information and contact numbers given to patient. Above information shared with Joanna Hall, Transitions of Care Manager.  Thank you for the opportunity to participate in this patient's care.  Joanna Abts, RN [facility] Liaison  [phone]

## 2020-03-13 LAB — BASIC METABOLIC PANEL
Anion gap: 7 (ref 5–15)
BUN: 11 mg/dL (ref 6–20)
CO2: 27 mmol/L (ref 22–32)
Calcium: 9 mg/dL (ref 8.9–10.3)
Chloride: 100 mmol/L (ref 98–111)
Creatinine, Ser: 0.6 mg/dL (ref 0.44–1.00)
GFR calc Af Amer: >60 mL/min (ref >60)
GFR calc non Af Amer: >60 mL/min (ref >60)
Glucose, Bld: 107 mg/dL — ABNORMAL HIGH (ref 70–99)
Potassium: 4.2 mmol/L (ref 3.5–5.1)
Sodium: 134 mmol/L — ABNORMAL LOW (ref 135–145)

## 2020-03-13 LAB — MAGNESIUM: Magnesium: 1.7 mg/dL (ref 1.7–2.4)

## 2020-03-13 LAB — CBC
HCT: 34.4 % — ABNORMAL LOW (ref 36.0–46.0)
Hemoglobin: 11.5 g/dL — ABNORMAL LOW (ref 12.0–15.0)
MCH: 30.9 pg (ref 26.0–34.0)
MCHC: 33.4 g/dL (ref 30.0–36.0)
MCV: 92.5 fL (ref 80.0–100.0)
Platelets: 195 10*3/uL (ref 150–400)
RBC: 3.72 MIL/uL — ABNORMAL LOW (ref 3.87–5.11)
RDW: 15.1 % (ref 11.5–15.5)
WBC: 5.3 10*3/uL (ref 4.0–10.5)
nRBC: 0 % (ref 0.0–0.2)

## 2020-03-13 MED ORDER — OXYCODONE HCL ER 60 MG PO T12A: 60.0000 mg | EXTENDED_RELEASE_TABLET | Freq: Two times a day (BID) | ORAL | 0 refills | Status: DC

## 2020-03-13 MED ORDER — PROMETHAZINE HCL 25 MG PO TABS: ORAL_TABLET | ORAL | 0 refills | Status: DC

## 2020-03-13 MED ORDER — FAMOTIDINE 20 MG PO TABS
20.0000 mg | ORAL_TABLET | Freq: Every day | ORAL | Status: AC | PRN
Start: 2020-03-13 — End: ?

## 2020-03-13 MED ORDER — OXYCODONE HCL ER 60 MG PO T12A: 60.0000 mg | EXTENDED_RELEASE_TABLET | Freq: Two times a day (BID) | ORAL | 0 refills | Status: AC

## 2020-03-13 MED ORDER — HEPARIN SOD (PORK) LOCK FLUSH 100 UNIT/ML IV SOLN
500.0000 [IU] | Freq: Once | INTRAVENOUS | Status: DC
  Filled 2020-03-13: qty 5

## 2020-03-13 MED ORDER — HYDROXYZINE HCL 25 MG PO TABS
25.0000 mg | ORAL_TABLET | Freq: Three times a day (TID) | ORAL | Status: DC | PRN
  Administered 2020-03-13: 25 mg via ORAL
  Filled 2020-03-13 (×2): qty 1

## 2020-03-13 MED ORDER — OXYCODONE HCL 15 MG PO TABS: 15.0000 mg | ORAL_TABLET | ORAL | 0 refills | Status: AC | PRN

## 2020-03-13 NOTE — Discharge Summary (Addendum)
Physician Discharge Summary   Joanna Hall  female DOB: September 14, 1966  ERD:[id number]  PCP: Towanda Malkin, MD  Admit date: 03/11/2020 Discharge date: 03/13/2020  Admitted From: home Disposition:  Home, with home hospice   CODE STATUS: DNR   Hospital Course:  For full details, please see H&P, progress notes, consult notes and ancillary notes.  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   # N/V and abdominal pain related to metastatic cervical cancer, improved Due to N/V, pt wasn't able to take her home oral pain meds, so pain became uncontrolled.  After presentation, pt's pain was controlled with IV dilaudid.  Oncology palliative care provider [name] Borders saw pt and made recommendation to increase OxyContin to 60 mg every 12 hours, to resume oxycodone IR for breakthrough pain but increase dose to 15 to 30 mg every 3 hours.  Pt was prescribed Phenergan for nausea.  On the day of discharge, pt was tolerating diet and felt ready to go home.  7 days of opioids pain meds were prescribed at discharge.  Pt was advised to follow up with palliative provider or hospice provider for further refills.  Pt no longer wanted cancer treatment and was set up with home hospice prior to discharge.  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  No diarrhea since presentation.  Abx was not initiated.   # Elevated BP, improved 2/2 to pain. Improved with pain management. Pt is not on home BP meds.  #factor V Leiden mutation with history of blood clots continued home Xarelto  # Hypokalemia and  hypomag Likely due to vomiting. Repleted PRN  # GERD continued home PPI  # Depression continued home Elavil   Discharge Diagnoses:  Active Problems:   Cancer associated pain    Discharge Instructions:  Allergies as of 03/13/2020      Reactions   Ketamine Anxiety, Other (See Comments)   Syncope episode/confusion      Medication List    STOP taking these medications   diphenoxylate-atropine 2.5-0.025 MG tablet Commonly known as: LOMOTIL   pantoprazole 40 MG tablet Commonly known as: PROTONIX   triamcinolone ointment 0.5 % Commonly known as: KENALOG     TAKE these medications   amitriptyline 100 MG tablet Commonly known as: ELAVIL Take 1 tablet (100 mg total) by mouth at bedtime.   famotidine 20 MG tablet Commonly known as: PEPCID Take 1 tablet (20 mg total) by mouth daily as needed for heartburn.   folic acid 1 MG tablet Commonly known as: FOLVITE Take 1 tablet (1 mg total) by mouth daily.   hydrOXYzine 25 MG capsule Commonly known as: VISTARIL Take 1 capsule (25 mg total) by mouth 3 (three) times daily as needed for itching.   hyoscyamine 0.375 MG 12 hr tablet Commonly known as: LEVBID Take 1 tablet (0.375 mg total) by mouth 2 (two) times daily.   methocarbamol 500 MG tablet Commonly known as: ROBAXIN Take 500 mg by mouth 4 (four) times daily.   naloxone 4 MG/0.1ML Liqd nasal spray kit Commonly known as: NARCAN 1 spray into nostril upon signs of opioid overdose. Call 911. May repeat once if no response within 2-3  minutes.   ondansetron 4 MG disintegrating tablet Commonly known as: Zofran ODT Take 1 tablet (4 mg total) by mouth every 6 (six) hours as needed for nausea or vomiting.   oxyCODONE 60 MG 12 hr tablet Commonly known as: OXYCONTIN Take 60 mg by mouth every 12 (twelve) hours for 7 days. What changed:   medication strength  how much to take  when to take this   oxyCODONE 15 MG immediate release tablet Commonly known as:  ROXICODONE Take 1-2 tablets (15-30 mg total) by mouth every 4 (four) hours as needed for up to 7 days for pain (break through pain). What changed:   how much to take  reasons to take this   promethazine 25 MG tablet Commonly known as: PHENERGAN Take 1 tablet (25 mg total) by mouth every 8 (eight) hours as needed for nausea or vomiting.   rivaroxaban 20 MG Tabs tablet Commonly known as: XARELTO Take 1 tablet (20 mg total) by mouth daily with supper.   Super Thera Vite M Tabs Take 1 tablet by mouth daily.   Ventolin HFA 108 (90 Base) MCG/ACT inhaler Generic drug: albuterol Inhale 1-2 puffs into the lungs every 4 (four) hours as needed for shortness of breath.       Follow-up Information    Borders, Kirt Boys, NP. Schedule an appointment as soon as possible for a visit in 1 week(s).   Specialty: Hospice and Palliative Medicine Contact information: [address] [place] [address] [phone]           Allergies  Allergen Reactions   Ketamine Anxiety and Other (See Comments)    Syncope episode/confusion      The results of significant diagnostics from this hospitalization (including imaging, microbiology, ancillary and laboratory) are listed below for reference.   Consultations:   Procedures/Studies: CT ABDOMEN PELVIS W CONTRAST  Result Date: 03/11/2020 CLINICAL DATA:  Abdominal pain. Reported metastatic cervical carcinoma. EXAM: CT ABDOMEN AND PELVIS WITH CONTRAST TECHNIQUE: Multidetector CT imaging of the abdomen and pelvis was performed using the standard protocol following bolus administration of intravenous contrast. CONTRAST:  55m OMNIPAQUE IOHEXOL 300 MG/ML  SOLN COMPARISON:  Feb 29, 2020 and January 14, 2020 FINDINGS: Lower chest: There are multiple pulmonary nodular lesions throughout the lung bases, many of which are cavitary. These lesions appear stable and range in size from as small as 4 mm to as large as 1.2 cm. No lung base edema or  consolidation. Hepatobiliary: There is hepatic steatosis. No focal liver lesions are demonstrable. Gallbladder is absent. There is dilatation of the common hepatic and common bile ducts measuring up to 14 mm. There is tapering of the common bile duct at the level of the ampulla. No obstructing lesion is seen by CT. There is slight intrahepatic biliary duct dilatation. Pancreas: No pancreatic mass or inflammatory focus. Spleen: No splenic lesions are evident. Adrenals/Urinary Tract: Adrenals bilaterally appear normal. Kidneys bilaterally show no evident mass or hydronephrosis on either side. There is a 1 mm nonobstructing calculus in the lower pole of the right kidney. There is a 1 mm calculus in the mid left kidney. There is no evident ureteral calculus on either side. No convincing ureteral lesion on current examination. Urinary bladder wall thickness is within normal limits. Stomach/Bowel: Status post right hemicolectomy with anastomosis patent. There is subtle thickening of the wall of the sigmoid colon without diverticular disease evident. This area may represent a degree of colitis. There is mild enhancement of the wall of  the distal ileum which may indicate a degree of enteritis. There is no evident bowel obstruction. No free air or portal venous air. Vascular/Lymphatic: No abdominal aortic aneurysm. There is aortic and common iliac artery atherosclerosis. No appreciable adenopathy in the abdomen or pelvis. Reproductive: Uterus is anteverted. No pelvic mass evident currently. Question edema in the upper vagina/cervical region. Other: No abscess or ascites evident in the abdomen or pelvis. Musculoskeletal: Apparent sacral insufficiency fractures are again noted there is degenerative change in the lumbar spine. Sclerosis is again noted in the medial superior pubic ramus. No destructive bone lesions evident. No intramuscular lesions. IMPRESSION: 1. 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 It should  be noted that septic emboli could present similarly. 2. Suspect a degree of sigmoid colitis with somewhat ill-defined bowel wall in this area. Question enteritis in the distal ileum with mild enhancement of the wall in this area. Bowel otherwise appears unremarkable. Postoperative change in the right colon with evidence of previous hemicolectomy. Anastomosis patent in this area. 3.  Apparent edema in the upper vagina and cervix region. 4. Small calculi in each kidney. No hydronephrosis on either side. No ureteral calculi. No ureteral lesions seen on this study. 5. Gallbladder absent. Persistent biliary duct dilatation with a 14 mm common bile duct. No obstructing focus evident in this area. Etiology for this bowel dilatation uncertain. MRCP would be the optimum imaging study of choice to further evaluate this region. 6. Insufficiency fractures in the sacrum. Question radiation therapy change in the medial left superior pubic ramus. Appearance stable in this area. 7.  Diffuse hepatic steatosis. Electronically Signed   By: Lowella Grip III M.D.   On: 03/11/2020 14:34   CT ABDOMEN PELVIS W CONTRAST  Result Date: 02/29/2020 CLINICAL DATA:  Abdominal distension, generalized abdominal pain EXAM: CT ABDOMEN AND PELVIS WITH CONTRAST TECHNIQUE: Multidetector CT imaging of the abdomen and pelvis was performed using the standard protocol following bolus administration of intravenous contrast. CONTRAST:  141m OMNIPAQUE IOHEXOL 300 MG/ML  SOLN COMPARISON:  01/14/2020 FINDINGS: Lower chest: Numerous nodules again identified within the bilateral lung bases, many of which are cavitary. No appreciable interval change from previous CT. Hepatobiliary: Focal fatty infiltration adjacent to the falciform ligament. No suspicious hepatic lesion. Prior cholecystectomy. Similar degree of mild intrahepatic biliary dilatation. Stable dilation of the common bile duct measuring approximately 14 mm in diameter at the level of the head  of the pancreas. Pancreas: Unremarkable. No pancreatic ductal dilatation or surrounding inflammatory changes. Spleen: Normal in size without focal abnormality. Adrenals/Urinary Tract: Unremarkable adrenal glands. Bilateral kidneys enhance symmetrically. Punctate 1-2 mm nonobstructing stone within the inferior pole of the right kidney. No suspicious renal lesion. No hydronephrosis. The ureters are nondilated. Previously described enhancing soft tissue in the region of the distal left ureter is not well seen on the current study given the lack of distention of the left ureter. Urinary bladder is decompressed, limiting its evaluation. There may be mild urinary bladder wall thickening, slightly more pronounced on the left, similar to prior. Stomach/Bowel: Prior right hemicolectomy. Moderate volume of stool within the colon. No focal colonic wall thickening or pericolonic inflammatory changes. Stomach and small bowel appear within normal limits. No dilated loops of bowel to suggest obstruction. Vascular/Lymphatic: Abdominal aortic atherosclerosis without aneurysm. IVC filter has been removed. No abdominopelvic lymphadenopathy. Reproductive: Unchanged appearance of the uterus and cervix. No adnexal masses. Other: Unchanged stranding within the low pelvis, likely post treatment changes. No free fluid, free air, or abdominal  wall hernia. Musculoskeletal: Chronic bilateral sacral insufficiency fractures, unchanged. Patchy sclerosis in the parasymphyseal left pubic bone, unchanged. Degenerative changes within the lower lumbar spine. IMPRESSION: 1. No acute abdominopelvic findings. 2. Previously seen left-sided hydronephrosis has resolved. 3. Numerous nodules again identified within the bilateral lung bases, many of which are cavitary. No appreciable interval change from previous CT. 4. Previously described enhancing soft tissue in the region of the distal left ureter is not well seen on the current study given the lack of  distention of the left ureter. There may be mild urinary bladder wall thickening, slightly more pronounced on the left, similar to prior. 5. Chronic bilateral sacral insufficiency fractures, unchanged. Patchy sclerosis in the parasymphyseal left pubic bone, unchanged. Aortic Atherosclerosis (ICD10-I70.0). Electronically Signed   By: Davina Poke D.O.   On: 02/29/2020 09:11   US RENAL  Result Date: 02/26/2020 CLINICAL DATA:  Acute generalized abdominal pain. EXAM: RENAL / URINARY TRACT ULTRASOUND COMPLETE COMPARISON:  January 14, 2020. FINDINGS: Right Kidney: Renal measurements: 11.5 x 4.7 x 3.8 cm = volume: 108 mL . Echogenicity within normal limits. No mass or hydronephrosis visualized. Left Kidney: Renal measurements: 11.3 x 5.0 x 4.4 cm = volume: 130 mL. Echogenicity within normal limits. No mass or hydronephrosis visualized. Bladder: Appears normal for degree of bladder distention. Other: None. IMPRESSION: Normal renal ultrasound. Electronically Signed   By: Marijo Conception M.D.   On: 02/26/2020 16:52   PERIPHERAL VASCULAR CATHETERIZATION  Result Date: 02/23/2020 See op note  VAS [place] LOWER EXTREMITY VENOUS (DVT)  Result Date: 02/17/2020  Lower Venous DVTStudy Other Indications: Hx PE. Risk Factors: Surgery IVC filter placed 01/01/2020. Performing Technologist: Concha Norway RVT  Examination Guidelines: A complete evaluation includes B-mode imaging, spectral Doppler, color Doppler, and power Doppler as needed of all accessible portions of each vessel. Bilateral testing is considered an integral part of a complete examination. Limited examinations for reoccurring indications may be performed as noted. The reflux portion of the exam is performed with the patient in reverse Trendelenburg.  +---------+---------------+---------+-----------+----------+--------------+  RIGHT     Compressibility Phasicity Spontaneity Properties Thrombus Aging   +---------+---------------+---------+-----------+----------+--------------+  CFV       Full            Yes       Yes                                    +---------+---------------+---------+-----------+----------+--------------+  SFJ       Full            Yes       Yes                                    +---------+---------------+---------+-----------+----------+--------------+  FV Prox   Full            Yes       Yes                                    +---------+---------------+---------+-----------+----------+--------------+  FV Mid    Full            Yes       Yes                                    +---------+---------------+---------+-----------+----------+--------------+  FV Distal Full            Yes       Yes                                    +---------+---------------+---------+-----------+----------+--------------+  PFV       Full            Yes       Yes                                    +---------+---------------+---------+-----------+----------+--------------+  POP       Full            Yes       Yes                                    +---------+---------------+---------+-----------+----------+--------------+  PTV       Full            Yes       Yes                                    +---------+---------------+---------+-----------+----------+--------------+  GSV       Full            Yes       Yes                                    +---------+---------------+---------+-----------+----------+--------------+  SSV       Full            Yes       Yes                                    +---------+---------------+---------+-----------+----------+--------------+   +---------+---------------+---------+-----------+----------+--------------+  LEFT      Compressibility Phasicity Spontaneity Properties Thrombus Aging  +---------+---------------+---------+-----------+----------+--------------+  CFV       Full            Yes       Yes                                     +---------+---------------+---------+-----------+----------+--------------+  SFJ       Full            Yes       Yes                                    +---------+---------------+---------+-----------+----------+--------------+  FV Prox   Full            Yes       Yes                                    +---------+---------------+---------+-----------+----------+--------------+  FV Mid    Full  Yes       Yes                                    +---------+---------------+---------+-----------+----------+--------------+  FV Distal Full            Yes       Yes                                    +---------+---------------+---------+-----------+----------+--------------+  PFV       Full            Yes       Yes                                    +---------+---------------+---------+-----------+----------+--------------+  POP       Full            Yes       Yes                                    +---------+---------------+---------+-----------+----------+--------------+  PTV       Full            Yes       Yes                                    +---------+---------------+---------+-----------+----------+--------------+  GSV       Full            Yes       Yes                                    +---------+---------------+---------+-----------+----------+--------------+  SSV       Full            Yes       Yes                                    +---------+---------------+---------+-----------+----------+--------------+     Summary: BILATERAL: - No evidence of deep vein thrombosis seen in the lower extremities, bilaterally. - No evidence of superficial venous thrombosis in the lower extremities, bilaterally.   *See table(s) above for measurements and observations. Electronically signed by Leotis Pain MD on 02/17/2020 at 10:08:31 AM.    Final       Labs: BNP (last 3 results) Recent Labs    05/23/19 1219  BNP 28.3   Basic Metabolic Panel: Recent Labs  Lab 03/11/20 0859 03/12/20 0511 03/13/20 0600  NA 136  137 134*  K 3.3* 4.0 4.2  CL 102 103 100  CO2 _0 GLUCOSE 110* 116* 107*  BUN _1 CREATININE 0.54 0.62 0.60  CALCIUM 8.9 9.1 9.0  MG  --  1.3* 1.7   Liver Function Tests: Recent Labs  Lab 03/11/20 0859  AST 69*  ALT 58*  ALKPHOS 260*  BILITOT 0.7  PROT 7.1  ALBUMIN 3.4*   Recent Labs  Lab 03/11/20 0859  LIPASE 25   No results for input(s): AMMONIA in the last 168 hours. CBC: Recent Labs  Lab 03/11/20 0859 03/12/20 0511 03/13/20 0600  WBC 5.9 4.0 5.3  HGB 12.2 11.7* 11.5*  HCT 37.4 34.2* 34.4*  MCV 94.7 91.4 92.5  PLT 198 179 195   Cardiac Enzymes: No results for input(s): CKTOTAL, CKMB, CKMBINDEX, TROPONINI in the last 168 hours. BNP: Invalid input(s): POCBNP CBG: No results for input(s): GLUCAP in the last 168 hours. D-Dimer No results for input(s): DDIMER in the last 72 hours. Hgb A1c No results for input(s): HGBA1C in the last 72 hours. Lipid Profile No results for input(s): CHOL, HDL, LDLCALC, TRIG, CHOLHDL, LDLDIRECT in the last 72 hours. Thyroid function studies No results for input(s): TSH, T4TOTAL, T3FREE, THYROIDAB in the last 72 hours.  Invalid input(s): FREET3 Anemia work up No results for input(s): VITAMINB12, FOLATE, FERRITIN, TIBC, IRON, RETICCTPCT in the last 72 hours. Urinalysis    Component Value Date/Time   COLORURINE YELLOW (A) 03/11/2020 1442   APPEARANCEUR CLEAR (A) 03/11/2020 1442   LABSPEC >1.046 (H) 03/11/2020 1442   PHURINE 7.0 03/11/2020 1442   GLUCOSEU NEGATIVE 03/11/2020 1442   HGBUR NEGATIVE 03/11/2020 1442   BILIRUBINUR NEGATIVE 03/11/2020 1442   KETONESUR NEGATIVE 03/11/2020 1442   PROTEINUR NEGATIVE 03/11/2020 1442   NITRITE NEGATIVE 03/11/2020 1442   LEUKOCYTESUR TRACE (A) 03/11/2020 1442   Sepsis Labs Invalid input(s): PROCALCITONIN,  WBC,  LACTICIDVEN Microbiology Recent Results (from the past 240 hour(s))  SARS Coronavirus 2 by RT PCR (hospital order, performed in [place] hospital lab)  Nasopharyngeal Nasopharyngeal Swab     Status: None   Collection Time: 03/11/20  3:29 PM   Specimen: Nasopharyngeal Swab  Result Value Ref Range Status   SARS Coronavirus 2 NEGATIVE NEGATIVE Final    Comment: (NOTE) SARS-CoV-2 target nucleic acids are NOT DETECTED. The SARS-CoV-2 RNA is generally detectable in upper and lower respiratory specimens during the acute phase of infection. The lowest concentration of SARS-CoV-2 viral copies this assay can detect is 250 copies / mL. A negative result does not preclude SARS-CoV-2 infection and should not be used as the sole basis for treatment or other patient management decisions.  A negative result may occur with improper specimen collection / handling, submission of specimen other than nasopharyngeal swab, presence of viral mutation(s) within the areas targeted by this assay, and inadequate number of viral copies (<250 copies / mL). A negative result must be combined with clinical observations, patient history, and epidemiological information. Fact Sheet for Patients:   StrictlyIdeas.no Fact Sheet for Healthcare Providers: BankingDealers.co.za This test is not yet approved or cleared  by the [place] FDA and has been authorized for detection and/or diagnosis of SARS-CoV-2 by FDA under an Emergency Use Authorization (EUA).  This EUA will remain in effect (meaning this test can be used) for the duration of the COVID-19 declaration under Section 564(b)(1) of the Act, 21 U.S.C. section 360bbb-3(b)(1), unless the authorization is terminated or revoked sooner. Performed at [facility], [address] [place], [place] [address]      Total time spend on discharging this patient, including the last patient exam, discussing the hospital stay, instructions for ongoing care as it relates to all pertinent caregivers, as well as preparing the medical discharge records, prescriptions,  and/or referrals as applicable, is 40 minutes.    Enzo Bi, MD  [facility] 03/13/2020, 10:32 AM  If 7PM-7AM, please contact night-coverage

## 2020-03-13 NOTE — Progress Notes (Signed)
Pt being discharged home, discharge instructions reviewed with pt, states understanding, pt with no complaints, awaiting ride

## 2020-03-17 ENCOUNTER — Inpatient Hospital Stay: Payer: Medicaid Other | Attending: Hospice and Palliative Medicine | Admitting: Hospice and Palliative Medicine

## 2020-03-17 DIAGNOSIS — C7800 Secondary malignant neoplasm of unspecified lung: Secondary | ICD-10-CM | POA: Insufficient documentation

## 2020-03-17 DIAGNOSIS — C539 Malignant neoplasm of cervix uteri, unspecified: Secondary | ICD-10-CM | POA: Insufficient documentation

## 2020-03-17 DIAGNOSIS — E876 Hypokalemia: Secondary | ICD-10-CM | POA: Insufficient documentation

## 2020-03-17 DIAGNOSIS — Z923 Personal history of irradiation: Secondary | ICD-10-CM | POA: Insufficient documentation

## 2020-03-17 DIAGNOSIS — Z9221 Personal history of antineoplastic chemotherapy: Secondary | ICD-10-CM | POA: Insufficient documentation

## 2020-03-17 DIAGNOSIS — Z515 Encounter for palliative care: Secondary | ICD-10-CM

## 2020-03-17 DIAGNOSIS — Z79899 Other long term (current) drug therapy: Secondary | ICD-10-CM | POA: Insufficient documentation

## 2020-03-17 NOTE — Progress Notes (Signed)
Was unable to reach patient by text/call.  Voicemail left.  Will reschedule.

## 2020-03-18 ENCOUNTER — Inpatient Hospital Stay: Payer: Medicaid Other | Admitting: Hospice and Palliative Medicine

## 2020-03-18 ENCOUNTER — Other Ambulatory Visit: Payer: Medicaid Other | Admitting: Primary Care

## 2020-03-19 ENCOUNTER — Telehealth: Payer: Self-pay | Admitting: *Deleted

## 2020-03-19 NOTE — Telephone Encounter (Signed)
Patient now under hospice care. I Left vm for patient to determine if patient still wants to keep her apts in clinic next week. Pt is sch. For lab/md iv mag. Otherwise, Merrily Pew will plan to follow patient in virtual visit in about 2 weeks.

## 2020-03-19 NOTE — Telephone Encounter (Signed)
Patient returned my phone call. She would like to cnl all her future apts with Dr. Rogue Bussing.  Pt accepted virtual visit with [name] in a few weeks.

## 2020-03-22 ENCOUNTER — Encounter: Payer: Self-pay | Admitting: Internal Medicine

## 2020-03-22 ENCOUNTER — Telehealth: Payer: Self-pay | Admitting: *Deleted

## 2020-03-22 MED ORDER — OXYCODONE HCL 15 MG PO TABS: 15.0000 mg | ORAL_TABLET | ORAL | 0 refills | Status: DC | PRN

## 2020-03-22 MED ORDER — MORPHINE SULFATE ER 60 MG PO TBCR: 60.0000 mg | EXTENDED_RELEASE_TABLET | Freq: Two times a day (BID) | ORAL | 0 refills | Status: DC

## 2020-03-22 MED ORDER — SLOW-MAG 71.5-119 MG PO TBEC: 1.0000 | DELAYED_RELEASE_TABLET | Freq: Two times a day (BID) | ORAL | 1 refills | Status: AC

## 2020-03-22 NOTE — Telephone Encounter (Signed)
I spoke with patient by phone.  She reports that pain has been relatively well controlled with increased dose of OxyContin to 60 mg every 12 hours.  However, she has continued to require frequent dosing oxycodone IR every few hours.  Patient had a visit with her hospice nurse today in the option of a pain pump was discussed.  I would strongly discourage the pain pump and loss patient is declining and can no longer tolerate oral medications.  We did discuss the need to rotate her from OxyContin to [place] Contin as only the latter is covered under the hospice formulary.  Patient was agreement with this plan.  I also called and spoke with her hospice nurse and discussed the plan.  Note total dose of OxyContin 120 mg which would be equivalent to 180 mg of morphine.  We will dose reduce to account for incomplete cross tolerance.  Will start MS Contin 60 mg every 12 with consideration of increasing to every 8 hour dosing if needed.  PDMP reviewed  Patient also had a question about receiving magnesium replacement via IV in the cancer center.  Would likely recommend p.o. supplementation.  However, will discuss with Dr. Rogue Bussing.  Plan: -DC OxyContin -Start MS Contin 60 mg every 12 hours with consideration of increasing every 8 hour dosing if needed (#30) -Continue oxycodone IR 15 to 30 mg every 3 hours as needed for breakthrough pain (#60)

## 2020-03-22 NOTE — Telephone Encounter (Signed)
Spoke with Dr. Rogue Bussing. V/o given to prescribe slow mag 64 mg 1 tablet twice daily. Patient aware of the plan of care.

## 2020-03-22 NOTE — Telephone Encounter (Signed)
Hospice nurse Anderson Malta called requesting a pain pump for this patient. Please order if in agreement and let her know. Thank you

## 2020-03-23 ENCOUNTER — Inpatient Hospital Stay: Payer: Medicaid Other

## 2020-03-23 ENCOUNTER — Inpatient Hospital Stay: Payer: Medicaid Other | Admitting: Internal Medicine

## 2020-03-27 ENCOUNTER — Other Ambulatory Visit: Payer: Self-pay | Admitting: Internal Medicine

## 2020-03-29 ENCOUNTER — Other Ambulatory Visit: Payer: Self-pay | Admitting: Oncology

## 2020-03-29 ENCOUNTER — Other Ambulatory Visit: Payer: Self-pay | Admitting: Hospice and Palliative Medicine

## 2020-03-29 MED ORDER — DIPHENOXYLATE-ATROPINE 2.5-0.025 MG PO TABS: 1.0000 | ORAL_TABLET | Freq: Four times a day (QID) | ORAL | 0 refills | Status: AC | PRN

## 2020-03-29 NOTE — Progress Notes (Signed)
I spoke with patient's hospice nurse. Patient has been having diarrhea since taking the slow mag. Unsure if diarrhea is related to the slow mag as patient has chronic recurring diarrhea. She is taking Imodium without significant relief. Will Rx Lomotil. Discussed with Dr. Rogue Bussing.

## 2020-03-31 ENCOUNTER — Encounter: Payer: Self-pay | Admitting: Internal Medicine

## 2020-03-31 ENCOUNTER — Telehealth: Payer: Self-pay | Admitting: Hospice and Palliative Medicine

## 2020-03-31 NOTE — Telephone Encounter (Signed)
I spoke with patient by phone. She reports persistent diarrhea over the past week. Lomotil has not helped. No fever or chills. No abd pain. She is still taking the hydrocortisone daily. Patient is interested in evaluation. Discussed with Dr. Rogue Bussing. Will bring patient in to the [facility] to check stool sample. She has history of recurrent colitis. May benefit from increased steroids. She was also managed in the past on oral budesonide but is no longer taking this. Other options for diarrhea could be considering including bile acid sequestrant or opium tincture.   Called and spoke with hospice nurse, Anderson Malta. [phone]

## 2020-04-01 ENCOUNTER — Inpatient Hospital Stay: Payer: Medicaid Other | Admitting: Hospice and Palliative Medicine

## 2020-04-01 ENCOUNTER — Other Ambulatory Visit: Payer: Self-pay

## 2020-04-01 ENCOUNTER — Inpatient Hospital Stay: Payer: Medicaid Other

## 2020-04-01 ENCOUNTER — Inpatient Hospital Stay (HOSPITAL_BASED_OUTPATIENT_CLINIC_OR_DEPARTMENT_OTHER): Payer: Medicaid Other | Admitting: Oncology

## 2020-04-01 DIAGNOSIS — R197 Diarrhea, unspecified: Secondary | ICD-10-CM

## 2020-04-01 DIAGNOSIS — R1033 Periumbilical pain: Secondary | ICD-10-CM

## 2020-04-01 DIAGNOSIS — G893 Neoplasm related pain (acute) (chronic): Secondary | ICD-10-CM

## 2020-04-01 DIAGNOSIS — C538 Malignant neoplasm of overlapping sites of cervix uteri: Secondary | ICD-10-CM

## 2020-04-01 DIAGNOSIS — R112 Nausea with vomiting, unspecified: Secondary | ICD-10-CM

## 2020-04-01 DIAGNOSIS — Z9221 Personal history of antineoplastic chemotherapy: Secondary | ICD-10-CM | POA: Diagnosis not present

## 2020-04-01 DIAGNOSIS — E876 Hypokalemia: Secondary | ICD-10-CM

## 2020-04-01 DIAGNOSIS — Z95828 Presence of other vascular implants and grafts: Secondary | ICD-10-CM

## 2020-04-01 DIAGNOSIS — C539 Malignant neoplasm of cervix uteri, unspecified: Secondary | ICD-10-CM | POA: Diagnosis not present

## 2020-04-01 DIAGNOSIS — C7800 Secondary malignant neoplasm of unspecified lung: Secondary | ICD-10-CM | POA: Diagnosis not present

## 2020-04-01 DIAGNOSIS — Z923 Personal history of irradiation: Secondary | ICD-10-CM | POA: Diagnosis not present

## 2020-04-01 DIAGNOSIS — Z79899 Other long term (current) drug therapy: Secondary | ICD-10-CM | POA: Diagnosis not present

## 2020-04-01 LAB — CBC WITH DIFFERENTIAL/PLATELET
Abs Immature Granulocytes: 0.02 10*3/uL (ref 0.00–0.07)
Basophils Absolute: 0 10*3/uL (ref 0.0–0.1)
Basophils Relative: 0 %
Eosinophils Absolute: 0 10*3/uL (ref 0.0–0.5)
Eosinophils Relative: 1 %
HCT: 36.2 % (ref 36.0–46.0)
Hemoglobin: 12.2 g/dL (ref 12.0–15.0)
Immature Granulocytes: 1 %
Lymphocytes Relative: 16 %
Lymphs Abs: 0.7 10*3/uL (ref 0.7–4.0)
MCH: 31.4 pg (ref 26.0–34.0)
MCHC: 33.7 g/dL (ref 30.0–36.0)
MCV: 93.3 fL (ref 80.0–100.0)
Monocytes Absolute: 0.3 10*3/uL (ref 0.1–1.0)
Monocytes Relative: 7 %
Neutro Abs: 3.2 10*3/uL (ref 1.7–7.7)
Neutrophils Relative %: 75 %
Platelets: 120 10*3/uL — ABNORMAL LOW (ref 150–400)
RBC: 3.88 MIL/uL (ref 3.87–5.11)
RDW: 16.9 % — ABNORMAL HIGH (ref 11.5–15.5)
WBC: 4.3 10*3/uL (ref 4.0–10.5)
nRBC: 0 % (ref 0.0–0.2)

## 2020-04-01 LAB — COMPREHENSIVE METABOLIC PANEL
ALT: 79 U/L — ABNORMAL HIGH (ref 0–44)
AST: 54 U/L — ABNORMAL HIGH (ref 15–41)
Albumin: 3.5 g/dL (ref 3.5–5.0)
Alkaline Phosphatase: 219 U/L — ABNORMAL HIGH (ref 38–126)
Anion gap: 9 (ref 5–15)
BUN: 7 mg/dL (ref 6–20)
CO2: 27 mmol/L (ref 22–32)
Calcium: 8 mg/dL — ABNORMAL LOW (ref 8.9–10.3)
Chloride: 104 mmol/L (ref 98–111)
Creatinine, Ser: 0.8 mg/dL (ref 0.44–1.00)
GFR calc Af Amer: >60 mL/min (ref >60)
GFR calc non Af Amer: >60 mL/min (ref >60)
Glucose, Bld: 107 mg/dL — ABNORMAL HIGH (ref 70–99)
Potassium: 3.5 mmol/L (ref 3.5–5.1)
Sodium: 140 mmol/L (ref 135–145)
Total Bilirubin: 0.6 mg/dL (ref 0.3–1.2)
Total Protein: 6.5 g/dL (ref 6.5–8.1)

## 2020-04-01 LAB — MAGNESIUM: Magnesium: 1 mg/dL — ABNORMAL LOW (ref 1.7–2.4)

## 2020-04-01 MED ORDER — DEXAMETHASONE SODIUM PHOSPHATE 10 MG/ML IJ SOLN
10.0000 mg | Freq: Once | INTRAMUSCULAR | Status: AC
  Administered 2020-04-01: 10 mg via INTRAVENOUS
  Filled 2020-04-01: qty 1

## 2020-04-01 MED ORDER — ONDANSETRON HCL 4 MG/2ML IJ SOLN
8.0000 mg | Freq: Once | INTRAMUSCULAR | Status: AC
  Administered 2020-04-01: 8 mg via INTRAVENOUS
  Filled 2020-04-01: qty 4

## 2020-04-01 MED ORDER — SODIUM CHLORIDE 0.9 % IV SOLN
Freq: Once | INTRAVENOUS | Status: AC
  Filled 2020-04-01: qty 1000

## 2020-04-01 MED ORDER — MORPHINE SULFATE (PF) 2 MG/ML IV SOLN
2.0000 mg | Freq: Once | INTRAVENOUS | Status: AC
  Administered 2020-04-01: 2 mg via INTRAVENOUS
  Filled 2020-04-01: qty 1

## 2020-04-01 MED ORDER — SODIUM CHLORIDE 0.9% FLUSH
10.0000 mL | INTRAVENOUS | Status: DC | PRN
  Administered 2020-04-01: 10 mL via INTRAVENOUS
  Filled 2020-04-01: qty 10

## 2020-04-01 MED ORDER — HEPARIN SOD (PORK) LOCK FLUSH 100 UNIT/ML IV SOLN
500.0000 [IU] | Freq: Once | INTRAVENOUS | Status: AC
  Administered 2020-04-01: 500 [IU] via INTRAVENOUS
  Filled 2020-04-01: qty 5

## 2020-04-01 NOTE — Progress Notes (Signed)
Symptom Management Consult note [facility]  [phone] Fax:([phone]  Patient Care Team: Towanda Malkin, MD as PCP - General (Internal Medicine) Mellody Drown, MD as Consulting Physician (Obstetrics and Gynecology) [name], MD as Consulting Physician (Gastroenterology) Michael Boston, MD as Consulting Physician (General Surgery) Lovell Sheehan, MD as Consulting Physician (Orthopedic Surgery) Cammie Sickle, MD as Consulting Physician (Oncology) Earnestine Leys, MD as Consulting Physician (Orthopedic Surgery) Jason Coop, NP as Nurse Practitioner Borders, Kirt Boys, NP as Nurse Practitioner [facility] and Palliative Medicine)   Name of the patient: Joanna Hall  [id number]  Jun 02, 1966   Date of visit: 04/01/2020   Diagnosis- 1. Hypomagnesemia  2. Diarrhea, unspecified type  3. Hypokalemia  Chief complaint/ Reason for visit- Diarrhea/nausea and vomiting  Heme/Onc history:  Oncology History Overview Note  # dec 2017- CERVICAL ADENO CA []; Stage IB [Dr. Christene Slates at [facility] in [place], [place];No surgery- Chemo-RT;   # 2018-sep - lung nodules [in [place]]  # AUG 20th, 2019-#1 Carbo-Taxol s/p 6 cycles- [No avastin sec to blood clots]: Feb 2020-bilateral lung nodules with 1 cm in size. March 2020- finished carbo-taxol #7; poor tolerance to [place].  # April 15 th 2020- Start Taxol weekly x 3; one week OFF;  # July 2020-CT- bil cavitary lesions- slightly bigger by few mm/overall STABLE; CT a/p-NED. 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  She received treatment from 11/2016-05/2017.  01/2017 cisplatin x 2 and carboplatin x 1 (01/29/2017) due to ARF and XRT.  XRT was followed by T & O on 02/01/2017 and T & N 02/10/2017 and 02/20/2017.  Course was complicated by 80 pound weight loss, nausea, vomiting, electrolyte wasting (potassium and magnesium).  She describes that.  Is been sick constantly requiring at least 20 hospitalizations.  Follow-up CT chest and PET on 06/2017. Per patient, 'radiation worked' and no disease in the abdomen.  At that time she was noted to have lung nodules that were growing and follow-up imaging was scheduled for 10/2017.  She was admitted to hospital in [place] for small bowel obstruction which was managed conservatively and she was home for a week prior to traveling to [place] for Thanksgiving holiday where she has family.  She presented to ER in [place] on 08/2017 with nausea, vomiting, and lower abdominal pain.  Symptoms did not respond to conservative treatment.   CT on 08/26/2017 revealed small bowel obstruction with transition in the pelvis just superior to the uterus rather was a long segment of distal ileum with fatty wall thickening compatible with chronic inflammation and/or radiation enteritis. Imaging showed numerous pulmonary nodules consistent with metastatic disease. She underwent laparotomy and right ileocolectomy on 08/31/2017 at [facility].  Surgical findings revealed a thickened, matted, and scarred piece of distal small bowel close to the ileocecal valve.  She was discharged on 09/05/2017.  Pain markedly increased in intensity and imaging was performed on 09/11/2017 which revealed: Debris within the  anterior abdominal wall incision concerning for infection versus packing material, s/p post  ileo-colectomy with expected postoperative changes, mild colonic ileus, numerous pulmonary nodules highly concerning for metastatic disease, punctate nonobstructing nephrolithiasis.  Staples were removed and one was packed.  She was started on doxycycline.  Abdominal and pelvic CT without contrast on 09/11/2017 revealed debris within anterior abdominal wall incision concerning for infection, versus packing material.   She is s/p ileocolectomy with expected postoperative changes and mild colonic ileus.  There were numerous pulmonary nodules highly concerning for metastatic disease and punctate nonobstructing nephrolithiasis. She was readmitted on 09/12/2017.  She describes the onset of lower abdominal pain on 09/09/2017.  Pain markedly increased in intensity on 09/11/2017.   Staples were removed and the wound packed.  She was started on doxycycline.  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   PET scan was ordered to evaluate enlarging lung nodules with concern for recurrent cervical cancer but scan was delayed due to insurance and need to be performed in [place].  Presented to ER on 12/03/2017 for abdominal pain and emesis.  Imaging concerning for worsening possible uterine infarct and she was admitted to hospital.  Pelvic MRI was unremarkable.  Remote scarring type changes of uterus thought to be possibly related to radiation.  She was discharged on 12/08/2017.  Underwent endoscopy and colonoscopy on 12/20/2017.    On 02/22/2018 she underwent laser ablation of condylomata around the anus and vagina under anesthesia with Dr.  Johney Maine.   04/15/2018:  Chest, abdomen, and pelvis CT revealed innumerable (> 100) cavitary nodules scattered in the lungs, moderately enlarging compared to the 11/08/2017 PET-CT, suspicious for metastatic disease.  One index node in the RLL measures 1.0 x 1.1 cm (previously 0.6 x 0.6 cm).  There were no new nodules.  There was an ill-defined wall thickening in the rectosigmoid with surrounding stranding along fascia planes, probably sequela from prior radiation therapy.  There was multilevel lumbar impingement due to spondylosis and degenerative disc disease.  There was heterogeneous enhancement in the uterus (some possibly from prior radiation therapy).   04/23/2018:  PET scan revealed numerous scattered solid and cavitary nodules in the lungs stable increased in size compared to the prior PET-CT from 11/08/2017. Largest nodule was 1.1 cm in the LUL (SUV 1.9).  These demonstrated low-grade metabolic activity up to a maximum SUV of about 2.3, increased from 11/08/2017.    Case was discussed at tumor board on 04/25/2018. Consensus to pursue CT-guided biopsy (05/06/18) which revealed: Metastatic adenocarcinoma, morphologically consistent with cervical adenocarcinoma.  She has history of chronic hepatitis C which is managed by GI.  Hepatitis C genotype is 2a/2c.  She receives B12 injections for history of B12 deficiency.  On 04/24/2018 she underwent left total knee replacement with Dr. [name]. --------------------------------------------------------   DIAGNOSIS: Cervical adenocarcinoma  STAGE: IV        ;GOALS: Palliative  CURRENT/MOST RECENT THERAPY : Alimta [C]    Malignant neoplasm of overlapping sites of cervix ([place])  01/29/2019 - 04/23/2019 Chemotherapy   The patient had PACLitaxel (TAXOL) 156 mg in sodium chloride 0.9 % 250 mL chemo infusion (</= 60m/m2), 80 mg/m2 =  156 mg, Intravenous,  Once, 3 of 4 cycles Dose modification: 65 mg/m2 (original dose 80 mg/m2, Cycle 1, Reason: Provider  Judgment) Administration: 156 mg (01/29/2019), 156 mg (02/05/2019), 126 mg (02/12/2019), 126 mg (02/26/2019), 126 mg (03/13/2019), 126 mg (03/27/2019), 126 mg (03/20/2019), 126 mg (04/03/2019), 126 mg (04/10/2019)  for chemotherapy treatment.    05/08/2019 - 07/30/2019 Chemotherapy   The patient had pembrolizumab (KEYTRUDA) 200 mg in sodium chloride 0.9 % 50 mL chemo infusion, 200 mg, Intravenous, Once, 4 of 6 cycles Administration: 200 mg (05/08/2019), 200 mg (05/29/2019), 200 mg (06/19/2019), 200 mg (07/10/2019)  for chemotherapy treatment.    08/07/2019 -  Chemotherapy   The patient had pegfilgrastim-cbqv [facility]) injection 6 mg, 6 mg, Subcutaneous, Once, 3 of 5 cycles Administration: 6 mg (08/29/2019), 6 mg (09/19/2019) PEMEtrexed (ALIMTA) 1,000 mg in sodium chloride 0.9 % 100 mL chemo infusion, 950 mg, Intravenous,  Once, 4 of 6 cycles Administration: 1,000 mg (08/07/2019), 1,000 mg (09/18/2019), 1,000 mg (11/13/2019), 1,000 mg (08/28/2019)  for chemotherapy treatment.     Interval history-Mrs. 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  She is status post multiple lines of chemo and radiation is currently on hospice care.  Past medical history notable for history of right hemocolectomy with chronic inflammation changes in the colon.  She has had multiple recent hospitalizations and ER visits for nausea, vomiting diarrhea and abdominal pain.  She is followed by Dr. Rogue Bussing and Dr. Fransisca Connors for her known malignancy and last received immunotherapy with Alimta on 11/13/2019.  Treatment has been held secondary to TKR with postop complication of hematoma.  Recently restarted Xarelto for history of PE DVT factor V Leiden.  Had her IVC filter removed about 1 month ago by Dr. Lucky Cowboy.  No complications.  She is followed by Dr. Regenia Skeeter, NP for pain management.  She is currently taking oxycodone/OxyContin for abdominal pelvic  pain. Appears stable at this time.   She presents today for worsening diarrhea, nausea and vomiting for the past 2 to 3 weeks. Using antemetic's and antidiarrheals as prescribed. Patient states stool consistency is watery and often clear in color.  She is incontinent.  Appetite fair.  She is able to eat soft vegetables and meats. Drinking fluids.  Unable to swallow bread.  Currently enrolled in hospice.  She is unsure if she needs hospice services at this time.  Pain is controlled.   ECOG FS:2 - Symptomatic, <50% confined to bed  Review of systems- Review of Systems  Constitutional: Positive for malaise/fatigue. Negative for chills, fever and weight loss.  HENT: Negative for congestion, ear pain and tinnitus.   Eyes: Negative.  Negative for blurred vision and double vision.  Respiratory: Negative.  Negative for cough, sputum production and shortness of breath.   Cardiovascular: Negative.  Negative for chest pain, palpitations and leg swelling.  Gastrointestinal: Positive for abdominal pain, diarrhea, nausea and vomiting. Negative for constipation.  Genitourinary: Negative for dysuria, frequency and urgency.  Musculoskeletal: Negative for back pain and falls.  Skin: Negative.  Negative for rash.  Neurological: Positive for weakness. Negative for headaches.  Endo/Heme/Allergies: Negative.  Does not bruise/bleed easily.  Psychiatric/Behavioral: Positive for depression. The patient is nervous/anxious. The patient does not have insomnia.      Current treatment- Hospice   Allergies  Allergen Reactions  . Ketamine Anxiety and Other (See Comments)    Syncope episode/confusion      Past Medical History:  Diagnosis Date  . Abdominal pain 06/10/2018  . Abnormal cervical Papanicolaou smear 09/18/2017  . Anxiety   . Aortic atherosclerosis ([place])   . Arthritis    neck and knees  . Blood clots in brain    both lungs and right kidney  . Blood transfusion without reported diagnosis   .  Cervical cancer ([place]) 09/2016   mets lung  . Chronic anal fissure   . Chronic diarrhea   . Dyspnea   . Erosive gastropathy 09/18/2017  . Factor V Leiden mutation ([place])   . Fecal incontinence   . Genital warts   . GERD (gastroesophageal reflux disease)   . GI bleed 10/08/2018  . Heart murmur   . Hematochezia   . Hemorrhoids   . 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 Hepatitis, chronic ([place]) 05/05/2017  . History of cancer chemotherapy    completed 06/2017  . History of Clostridium difficile infection    while undergoing chemo.  Negative test 10/2017  . Ileocolic anastomotic leak   . 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 Intestinal infection due to Clostridium difficile 09/18/2017  . Lung cancer ([place])   . Macrocytic anemia with vitamin B12 deficiency   . Multiple gastric ulcers   . Nausea vomiting and diarrhea   . Pancolitis ([place]) 07/27/2018  . Perianal condylomata   . Pneumonia    History of  . Pulmonary nodules   . Rectal bleeding   . Small bowel obstruction ([place]) 08/2017  . Stiff neck    limited right turn  . Vitamin D deficiency      Past Surgical History:  Procedure Laterality Date  . CHOLECYSTECTOMY    . COLON SURGERY  08/2017   resection  . COLONOSCOPY WITH PROPOFOL N/A 12/20/2017   Procedure: COLONOSCOPY WITH PROPOFOL;  Surgeon: [name], MD;  Location: [facility] ENDOSCOPY;  Service: Gastroenterology;  Laterality: N/A;  . COLONOSCOPY WITH PROPOFOL N/A 07/30/2018   Procedure: COLONOSCOPY WITH PROPOFOL;  Surgeon: [name], MD;  Location: [facility] ENDOSCOPY;  Service: Gastroenterology;  Laterality: N/A;  . COLONOSCOPY WITH PROPOFOL N/A 10/10/2018   Procedure: COLONOSCOPY WITH PROPOFOL;  Surgeon: Lucilla Lame, MD;  Location: [facility] ENDOSCOPY;  Service: Endoscopy;  Laterality: N/A;  . DIAGNOSTIC LAPAROSCOPY    . ESOPHAGOGASTRODUODENOSCOPY (EGD) WITH PROPOFOL N/A 12/20/2017   Procedure: ESOPHAGOGASTRODUODENOSCOPY  (EGD) WITH PROPOFOL;  Surgeon: [name], MD;  Location: [place];  Service: Gastroenterology;  Laterality: N/A;  . ESOPHAGOGASTRODUODENOSCOPY (EGD) WITH PROPOFOL  07/30/2018   Procedure: ESOPHAGOGASTRODUODENOSCOPY (EGD) WITH PROPOFOL;  Surgeon: [name], MD;  Location: [facility] ENDOSCOPY;  Service: Gastroenterology;;  . Otho Darner SIGMOIDOSCOPY N/A 11/21/2018   Procedure: FLEXIBLE SIGMOIDOSCOPY;  Surgeon: [name], MD;  Location: [facility] ENDOSCOPY;  Service: Gastroenterology;  Laterality: N/A;  . IVC FILTER INSERTION N/A 01/01/2020   Procedure: IVC FILTER INSERTION;  Surgeon: Algernon Huxley, MD;  Location: [place] CV LAB;  Service: Cardiovascular;  Laterality: N/A;  . IVC FILTER REMOVAL N/A 02/23/2020   Procedure: IVC FILTER REMOVAL;  Surgeon: Algernon Huxley, MD;  Location: [place] CV LAB;  Service: Cardiovascular;  Laterality: N/A;  . LAPAROTOMY N/A 08/31/2017   Procedure: EXPLORATORY LAPAROTOMY for SBO, ileocolectomy, removal of piece of uterine wall;  Surgeon: [name], MD;  Location: [facility] ORS;  Service: General;  Laterality: N/A;  . LASER ABLATION CONDOLAMATA N/A 02/22/2018   Procedure: LASER ABLATION/REMOVAL OF CONDOLAMATA AROUND  ANUS AND VAGINA;  Surgeon: Michael Boston, MD;  Location: [facility];  Service: General;  Laterality: N/A;  . OOPHORECTOMY    . PORTA CATH INSERTION N/A 05/13/2018   Procedure: PORTA CATH INSERTION;  Surgeon: Katha Cabal, MD;  Location: [place] CV LAB;  Service: Cardiovascular;  Laterality: N/A;  . SMALL INTESTINE SURGERY    . TANDEM RING INSERTION     x3  . THORACOTOMY    . TOTAL KNEE ARTHROPLASTY Left 04/24/2018   Procedure: TOTAL KNEE ARTHROPLASTY;  Surgeon: Lovell Sheehan, MD;  Location: [facility] ORS;  Service: Orthopedics;  Laterality: Left;  . TOTAL KNEE ARTHROPLASTY Right 12/24/2019   Procedure: RIGHT TOTAL KNEE ARTHROPLASTY;  Surgeon: Lovell Sheehan, MD;  Location: [facility] ORS;  Service:  Orthopedics;  Laterality: Right;    Social History   Socioeconomic History  . Marital status: Divorced    Spouse name: Not on file  . Number of children: Not on file  . Years of education: Not on file  . Highest education level: Not on file  Occupational History  . Not on file  Tobacco Use  . Smoking status: Former Smoker    Packs/day: 0.25    Years: 10.00    Pack years: 2.50    Types: Cigarettes    Quit date: 10/16/2006    Years since quitting: 13.4  . Smokeless tobacco: Never Used  Vaping Use  . Vaping Use: Never used  Substance and Sexual Activity  . Alcohol use: Not Currently    Comment: seldom  . Drug use: Yes    Types: Marijuana    Comment: not very often  . Sexual activity: Not Currently    Birth control/protection: Post-menopausal    Comment: Not Asked  Other Topics Concern  . Not on file  Social History Narrative  . Not on file   Social Determinants of Health   Financial Resource Strain:   . Difficulty of Paying Living Expenses:   Food Insecurity:   . Worried About Charity fundraiser in the Last Year:   . Arboriculturist in the Last Year:   Transportation Needs:   . Film/video editor (Medical):   Marland Kitchen Lack of Transportation (Non-Medical):   Physical Activity:   . Days of Exercise per Week:   . Minutes of Exercise per Session:   Stress:   . Feeling of Stress :   Social Connections:   . Frequency of Communication with Friends and Family:   . Frequency of Social Gatherings with Friends and Family:   . Attends Religious Services:   . Active Member of Clubs or Organizations:   . Attends Archivist Meetings:   Marland Kitchen Marital Status:   Intimate Partner Violence:   . Fear of Current or Ex-Partner:   . Emotionally Abused:   Marland Kitchen Physically Abused:   . Sexually Abused:     Family History  Problem Relation Age of Onset  . Hypertension Mother   . Hypertension Father   . Diabetes Father   . Hyperlipidemia Father   . Alcohol abuse Daughter   .  Hypertension Maternal Grandmother   . Diabetes Maternal Grandmother   . Diabetes Paternal Grandmother   . Hypertension Paternal Grandmother      Current Outpatient Medications:  .  amitriptyline (ELAVIL) 100 MG tablet, Take 1 tablet (100 mg total) by mouth at bedtime., Disp: 90 tablet, Rfl: 1 .  diphenoxylate-atropine (LOMOTIL) 2.5-0.025 MG tablet, Take 1 tablet by mouth  4 (four) times daily as needed for diarrhea or loose stools., Disp: 60 tablet, Rfl: 0 .  famotidine (PEPCID) 20 MG tablet, Take 1 tablet (20 mg total) by mouth daily as needed for heartburn., Disp: , Rfl:  .  folic acid (FOLVITE) 1 MG tablet, Take 1 tablet (1 mg total) by mouth daily., Disp: 30 tablet, Rfl: 6 .  hydrOXYzine (VISTARIL) 25 MG capsule, Take 1 capsule (25 mg total) by mouth 3 (three) times daily as needed for itching., Disp: 30 capsule, Rfl: 0 .  hyoscyamine (LEVBID) 0.375 MG 12 hr tablet, Take 1 tablet (0.375 mg total) by mouth 2 (two) times daily., Disp: 60 tablet, Rfl: 0 .  magnesium chloride (SLOW-MAG) 64 MG TBEC SR tablet, Take 1 tablet (64 mg total) by mouth 2 (two) times daily., Disp: 60 tablet, Rfl: 1 .  methocarbamol (ROBAXIN) 500 MG tablet, Take 500 mg by mouth 4 (four) times daily., Disp: , Rfl:  .  morphine (MS CONTIN) 60 MG 12 hr tablet, Take 1 tablet (60 mg total) by mouth every 12 (twelve) hours., Disp: 30 tablet, Rfl: 0 .  Multiple Vitamins-Minerals (SUPER THERA VITE M) TABS, Take 1 tablet by mouth daily. , Disp: , Rfl:  .  naloxone (NARCAN) nasal spray 4 mg/0.1 mL, 1 spray into nostril upon signs of opioid overdose. Call 911. May repeat once if no response within 2-3 minutes., Disp: 1 each, Rfl: 0 .  ondansetron (ZOFRAN-ODT) 4 MG disintegrating tablet, Take 1 tablet (4 mg total) by mouth every 8 (eight) hours as needed for nausea or vomiting., Disp: 30 tablet, Rfl: 0 .  oxyCODONE (ROXICODONE) 15 MG immediate release tablet, Take 1-2 tablets (15-30 mg total) by mouth every 3 (three) hours as needed  for pain., Disp: 60 tablet, Rfl: 0 .  promethazine (PHENERGAN) 25 MG tablet, Take 1 tablet (25 mg total) by mouth every 8 (eight) hours as needed for nausea or vomiting., Disp: 30 tablet, Rfl: 0 .  rivaroxaban (XARELTO) 20 MG TABS tablet, Take 1 tablet (20 mg total) by mouth daily with supper., Disp: 60 tablet, Rfl: 1 .  VENTOLIN HFA 108 (90 Base) MCG/ACT inhaler, Inhale 1-2 puffs into the lungs every 4 (four) hours as needed for shortness of breath., Disp: 1 Inhaler, Rfl: 1 No current facility-administered medications for this visit.  Facility-Administered Medications Ordered in Other Visits:  .  heparin lock flush 100 unit/mL, 500 Units, Intravenous, Once, Corcoran, Melissa C, MD .  sodium chloride 0.9 % 1,000 mL with potassium chloride 20 mEq infusion, , Intravenous, Once, Honor Loh E, NP .  sodium chloride flush (NS) 0.9 % injection 10 mL, 10 mL, Intravenous, Once, Borders, Kirt Boys, NP  Physical exam: There were no vitals filed for this visit. Physical Exam Constitutional:      Appearance: Normal appearance.  HENT:     Head: Normocephalic and atraumatic.  Eyes:     Pupils: Pupils are equal, round, and reactive to light.  Cardiovascular:     Rate and Rhythm: Normal rate and regular rhythm.     Heart sounds: Normal heart sounds. No murmur heard.   Pulmonary:     Effort: Pulmonary effort is normal.     Breath sounds: Normal breath sounds. No wheezing.  Abdominal:     General: Bowel sounds are normal. There is no distension.     Palpations: Abdomen is soft.     Tenderness: There is no abdominal tenderness.  Musculoskeletal:        General: Normal  range of motion.     Cervical back: Normal range of motion.  Skin:    General: Skin is warm and dry.     Findings: No rash.  Neurological:     Mental Status: She is alert and oriented to person, place, and time.  Psychiatric:        Judgment: Judgment normal.      CMP Latest Ref Rng & Units 04/01/2020  Glucose 70 - 99 mg/dL  107(H)  BUN 6 - 20 mg/dL 7  Creatinine 0.44 - 1.00 mg/dL 0.80  Sodium 135 - 145 mmol/L 140  Potassium 3.5 - 5.1 mmol/L 3.5  Chloride 98 - 111 mmol/L 104  CO2 22 - 32 mmol/L 27  Calcium 8.9 - 10.3 mg/dL 8.0(L)  Total Protein 6.5 - 8.1 g/dL 6.5  Total Bilirubin 0.3 - 1.2 mg/dL 0.6  Alkaline Phos 38 - 126 U/L 219(H)  AST 15 - 41 U/L 54(H)  ALT 0 - 44 U/L 79(H)   CBC Latest Ref Rng & Units 04/01/2020  WBC 4.0 - 10.5 K/uL 4.3  Hemoglobin 12.0 - 15.0 g/dL 12.2  Hematocrit 36 - 46 % 36.2  Platelets 150 - 400 K/uL 120(L)    No images are attached to the encounter.  CT ABDOMEN PELVIS W CONTRAST  Result Date: 03/11/2020 CLINICAL DATA:  Abdominal pain. Reported metastatic cervical carcinoma. EXAM: CT ABDOMEN AND PELVIS WITH CONTRAST TECHNIQUE: Multidetector CT imaging of the abdomen and pelvis was performed using the standard protocol following bolus administration of intravenous contrast. CONTRAST:  40m OMNIPAQUE IOHEXOL 300 MG/ML  SOLN COMPARISON:  Feb 29, 2020 and January 14, 2020 FINDINGS: Lower chest: There are multiple pulmonary nodular lesions throughout the lung bases, many of which are cavitary. These lesions appear stable and range in size from as small as 4 mm to as large as 1.2 cm. No lung base edema or consolidation. Hepatobiliary: There is hepatic steatosis. No focal liver lesions are demonstrable. Gallbladder is absent. There is dilatation of the common hepatic and common bile ducts measuring up to 14 mm. There is tapering of the common bile duct at the level of the ampulla. No obstructing lesion is seen by CT. There is slight intrahepatic biliary duct dilatation. Pancreas: No pancreatic mass or inflammatory focus. Spleen: No splenic lesions are evident. Adrenals/Urinary Tract: Adrenals bilaterally appear normal. Kidneys bilaterally show no evident mass or hydronephrosis on either side. There is a 1 mm nonobstructing calculus in the lower pole of the right kidney. There is a 1 mm calculus  in the mid left kidney. There is no evident ureteral calculus on either side. No convincing ureteral lesion on current examination. Urinary bladder wall thickness is within normal limits. Stomach/Bowel: Status post right hemicolectomy with anastomosis patent. There is subtle thickening of the wall of the sigmoid colon without diverticular disease evident. This area may represent a degree of colitis. There is mild enhancement of the wall of the distal ileum which may indicate a degree of enteritis. There is no evident bowel obstruction. No free air or portal venous air. Vascular/Lymphatic: No abdominal aortic aneurysm. There is aortic and common iliac artery atherosclerosis. No appreciable adenopathy in the abdomen or pelvis. Reproductive: Uterus is anteverted. No pelvic mass evident currently. Question edema in the upper vagina/cervical region. Other: No abscess or ascites evident in the abdomen or pelvis. Musculoskeletal: Apparent sacral insufficiency fractures are again noted there is degenerative change in the lumbar spine. Sclerosis is again noted in the medial superior pubic ramus. No  destructive bone lesions evident. No intramuscular lesions. IMPRESSION: 1. 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 It should be noted that septic emboli could present similarly. 2. Suspect a degree of sigmoid colitis with somewhat ill-defined bowel wall in this area. Question enteritis in the distal ileum with mild enhancement of the wall in this area. Bowel otherwise appears unremarkable. Postoperative change in the right colon with evidence of previous hemicolectomy. Anastomosis patent in this area. 3.  Apparent edema in the upper vagina and cervix region. 4. Small calculi in each kidney. No hydronephrosis on either side. No ureteral calculi. No ureteral lesions seen on this study. 5. Gallbladder absent. Persistent biliary duct dilatation with a 14 mm common bile duct. No obstructing focus evident in this area.  Etiology for this bowel dilatation uncertain. MRCP would be the optimum imaging study of choice to further evaluate this region. 6. Insufficiency fractures in the sacrum. Question radiation therapy change in the medial left superior pubic ramus. Appearance stable in this area. 7.  Diffuse hepatic steatosis. Electronically Signed   By: Lowella Grip III M.D.   On: 03/11/2020 14:34     Assessment and plan- Patient is a 54 y.o. female who presents to symptom management for persistent nausea vomiting, diarrhea and abdominal pain.   Unclear etiology but this has been chronic.  She has been seen on multiple occasions for same complaints.  Will check lab work today and replace electrolytes as needed.  Labs show magnesium of 1 and potassium of 3.5.  We will go ahead and replace with 1 L normal saline, 4 g magnesium and 20 mEq potassium given extent of diarrhea.  She is to continue all of her current medications as prescribed.  Diarrhea, nausea and vomiting waxes and wanes.  Patient knows to call clinic with concerns of electrolyte imbalances.  Plan: Give 1 L normal saline, 4 g magnesium and 20 mEq potassium Give 2 mg morphine for abdominal pain. Continue narcotics as prescribed. Continue hospice care.  Disposition: Return to clinic as needed.  Visit Diagnosis 1. Hypomagnesemia   2. Diarrhea, unspecified type   3. Hypokalemia     Patient expressed understanding and was in agreement with this plan. She also understands that She can call clinic at any time with any questions, concerns, or complaints.   Greater than 50% was spent in counseling and coordination of care with this patient including but not limited to discussion of the relevant topics above (See A&P) including, but not limited to diagnosis and management of acute and chronic medical conditions.   Thank you for allowing me to participate in the care of this very pleasant patient.    Jacquelin Hawking, NP [place] [facility] Cell - [phone] Pager- [phone] 04/01/2020 9:59 AM

## 2020-04-02 ENCOUNTER — Telehealth: Payer: Self-pay | Admitting: *Deleted

## 2020-04-02 ENCOUNTER — Other Ambulatory Visit: Payer: Self-pay | Admitting: Oncology

## 2020-04-02 ENCOUNTER — Encounter: Payer: Self-pay | Admitting: Internal Medicine

## 2020-04-02 MED ORDER — OXYCODONE HCL 15 MG PO TABS: 15.0000 mg | ORAL_TABLET | ORAL | 0 refills | Status: DC | PRN

## 2020-04-02 NOTE — Progress Notes (Signed)
Re: Pain Medications  Patient called asking for a refill of her oxycodone 15 mg tablets (take 1 to 2 tablets every 3 hours as needed for pain # 60).  Reviewed PDMP website.  Appropriate for refill.  Patient is currently under hospice services.  She is unsure if she wishes to continue with hospice at this time given her performance status is good.  Faythe Casa, NP 04/02/2020 11:26 AM

## 2020-04-02 NOTE — Telephone Encounter (Signed)
-----   Message from Clancy Gourd sent at 04/02/2020  6:54 AM EDT ----- Regarding: RX NOT COVERED NOTIFICATION Hello,  I just uploaded a RX NOT COVERED NOTIFICATION into this pt's chart in Epic under the Media tab. Thanks.

## 2020-04-02 NOTE — Telephone Encounter (Signed)
PA submitted to [employer] approval.  Joanna Hall (KeyDelano Metz) - [id number] Xarelto 20MG tablets

## 2020-04-05 ENCOUNTER — Encounter: Payer: Self-pay | Admitting: Internal Medicine

## 2020-04-05 ENCOUNTER — Ambulatory Visit (INDEPENDENT_AMBULATORY_CARE_PROVIDER_SITE_OTHER): Payer: Medicaid Other | Admitting: Nurse Practitioner

## 2020-04-12 ENCOUNTER — Other Ambulatory Visit: Payer: Self-pay | Admitting: Internal Medicine

## 2020-04-14 ENCOUNTER — Other Ambulatory Visit: Payer: Self-pay | Admitting: Gastroenterology

## 2020-04-14 ENCOUNTER — Other Ambulatory Visit: Payer: Self-pay

## 2020-04-14 DIAGNOSIS — K529 Noninfective gastroenteritis and colitis, unspecified: Secondary | ICD-10-CM

## 2020-04-14 MED ORDER — BUDESONIDE 3 MG PO CPEP: 9.0000 mg | ORAL_CAPSULE | Freq: Every day | ORAL | 1 refills | Status: AC

## 2020-04-14 NOTE — Telephone Encounter (Signed)
Last office visit 05/06/2019

## 2020-04-16 ENCOUNTER — Telehealth: Payer: Self-pay | Admitting: *Deleted

## 2020-04-16 ENCOUNTER — Encounter: Payer: Self-pay | Admitting: Oncology

## 2020-04-16 ENCOUNTER — Other Ambulatory Visit: Payer: Self-pay | Admitting: Oncology

## 2020-04-16 ENCOUNTER — Encounter: Payer: Self-pay | Admitting: *Deleted

## 2020-04-16 MED ORDER — OXYCODONE HCL 15 MG PO TABS: 15.0000 mg | ORAL_TABLET | ORAL | 0 refills | Status: AC | PRN

## 2020-04-16 MED ORDER — MORPHINE SULFATE ER 60 MG PO TBCR: 60.0000 mg | EXTENDED_RELEASE_TABLET | Freq: Two times a day (BID) | ORAL | 0 refills | Status: AC

## 2020-04-16 NOTE — Telephone Encounter (Signed)
PA submitted for patient's oxycodone and Morphine sulfate tablets via covermymeds.

## 2020-04-16 NOTE — Progress Notes (Signed)
RE: narcotic refill  Hospice Patient   Reviewed PDMP.  Okay to refill.  Rx oxycodone 15 mg tablets 1 to 2 tablets every 3 hours as needed for pain. # 60 tabs Rx morphine 60 mg every 12 hours # 30 tabs  Faythe Casa, NP 04/16/2020 10:30 AM

## 2020-04-19 ENCOUNTER — Other Ambulatory Visit: Payer: Self-pay | Admitting: Gastroenterology

## 2020-04-20 ENCOUNTER — Other Ambulatory Visit: Payer: Self-pay

## 2020-04-20 NOTE — Telephone Encounter (Signed)
Patient called and wanting a refill on the Amitriptyline states this helps her sleep.  Last office visit 05/06/19 chronic diarrhea  Last refill 04/20/2020

## 2020-04-20 NOTE — Telephone Encounter (Signed)
approved

## 2020-04-21 ENCOUNTER — Encounter: Payer: Self-pay | Admitting: Psychiatry

## 2020-04-21 ENCOUNTER — Telehealth: Payer: Self-pay

## 2020-04-21 ENCOUNTER — Telehealth (INDEPENDENT_AMBULATORY_CARE_PROVIDER_SITE_OTHER): Payer: Medicaid Other | Admitting: Psychiatry

## 2020-04-21 ENCOUNTER — Other Ambulatory Visit: Payer: Self-pay

## 2020-04-21 DIAGNOSIS — F1421 Cocaine dependence, in remission: Secondary | ICD-10-CM | POA: Insufficient documentation

## 2020-04-21 DIAGNOSIS — F330 Major depressive disorder, recurrent, mild: Secondary | ICD-10-CM | POA: Diagnosis not present

## 2020-04-21 DIAGNOSIS — F1220 Cannabis dependence, uncomplicated: Secondary | ICD-10-CM | POA: Insufficient documentation

## 2020-04-21 DIAGNOSIS — F1021 Alcohol dependence, in remission: Secondary | ICD-10-CM

## 2020-04-21 MED ORDER — MIRTAZAPINE 15 MG PO TABS: 15.0000 mg | ORAL_TABLET | Freq: Every day | ORAL | 1 refills | Status: AC

## 2020-04-21 NOTE — Telephone Encounter (Signed)
Patient is calling to let us know her behavioral health doctor is taking her off the amitriptyline and changing her to Mirtazapine. She states she will take this the next 2 nights then changed to mirtazapine. Sending to provider for [facility]

## 2020-04-21 NOTE — Patient Instructions (Signed)
Mirtazapine tablets What is this medicine? MIRTAZAPINE (mir TAZ a peen) is used to treat depression. This medicine may be used for other purposes; ask your health care provider or pharmacist if you have questions. COMMON BRAND NAME(S): Remeron What should I tell my health care provider before I take this medicine? They need to know if you have any of these conditions:  bipolar disorder  glaucoma  kidney disease  liver disease  suicidal thoughts  an unusual or allergic reaction to mirtazapine, other medicines, foods, dyes, or preservatives  pregnant or trying to get pregnant  breast-feeding How should I use this medicine? Take this medicine by mouth with a glass of water. Follow the directions on the prescription label. Take your medicine at regular intervals. Do not take your medicine more often than directed. Do not stop taking this medicine suddenly except upon the advice of your doctor. Stopping this medicine too quickly may cause serious side effects or your condition may worsen. A special MedGuide will be given to you by the pharmacist with each prescription and refill. Be sure to read this information carefully each time. Talk to your pediatrician regarding the use of this medicine in children. Special care may be needed. Overdosage: If you think you have taken too much of this medicine contact a poison control center or emergency room at once. NOTE: This medicine is only for you. Do not share this medicine with others. What if I miss a dose? If you miss a dose, take it as soon as you can. If it is almost time for your next dose, take only that dose. Do not take double or extra doses. What may interact with this medicine? Do not take this medicine with any of the following medications:  linezolid  MAOIs like Carbex, Eldepryl, Marplan, Nardil, and Parnate  methylene blue (injected into a vein) This medicine may also interact with the following  medications:  alcohol  antiviral medicines for HIV or AIDS  certain medicines that treat or prevent blood clots like warfarin  certain medicines for depression, anxiety, or psychotic disturbances  certain medicines for fungal infections like ketoconazole and itraconazole  certain medicines for migraine headache like almotriptan, eletriptan, frovatriptan, naratriptan, rizatriptan, sumatriptan, zolmitriptan  certain medicines for seizures like carbamazepine or phenytoin  certain medicines for sleep  cimetidine  erythromycin  fentanyl  lithium  medicines for blood pressure  nefazodone  rasagiline  rifampin  supplements like St. John's wort, kava kava, valerian  tramadol  tryptophan This list may not describe all possible interactions. Give your health care provider a list of all the medicines, herbs, non-prescription drugs, or dietary supplements you use. Also tell them if you smoke, drink alcohol, or use illegal drugs. Some items may interact with your medicine. What should I watch for while using this medicine? Tell your doctor if your symptoms do not get better or if they get worse. Visit your doctor or health care professional for regular checks on your progress. Because it may take several weeks to see the full effects of this medicine, it is important to continue your treatment as prescribed by your doctor. Patients and their families should watch out for new or worsening thoughts of suicide or depression. Also watch out for sudden changes in feelings such as feeling anxious, agitated, panicky, irritable, hostile, aggressive, impulsive, severely restless, overly excited and hyperactive, or not being able to sleep. If this happens, especially at the beginning of treatment or after a change in dose, call your health  care professional. Dennis Bast may get drowsy or dizzy. Do not drive, use machinery, or do anything that needs mental alertness until you know how this medicine  affects you. Do not stand or sit up quickly, especially if you are an older patient. This reduces the risk of dizzy or fainting spells. Alcohol may interfere with the effect of this medicine. Avoid alcoholic drinks. This medicine may cause dry eyes and blurred vision. If you wear contact lenses you may feel some discomfort. Lubricating drops may help. See your eye doctor if the problem does not go away or is severe. Your mouth may get dry. Chewing sugarless gum or sucking hard candy, and drinking plenty of water may help. Contact your doctor if the problem does not go away or is severe. What side effects may I notice from receiving this medicine? Side effects that you should report to your doctor or health care professional as soon as possible:  allergic reactions like skin rash, itching or hives, swelling of the face, lips, or tongue  anxious  changes in vision  chest pain  confusion  elevated mood, decreased need for sleep, racing thoughts, impulsive behavior  eye pain  fast, irregular heartbeat  feeling faint or lightheaded, falls  feeling agitated, angry, or irritable  fever or chills, sore throat  hallucination, loss of contact with reality  loss of balance or coordination  mouth sores  redness, blistering, peeling or loosening of the skin, including inside the mouth  restlessness, pacing, inability to keep still  seizures  stiff muscles  suicidal thoughts or other mood changes  trouble passing urine or change in the amount of urine  trouble sleeping  unusual bleeding or bruising  unusually weak or tired  vomiting Side effects that usually do not require medical attention (report to your doctor or health care professional if they continue or are bothersome):  change in appetite  constipation  dizziness  dry mouth  muscle aches or pains  nausea  tired  weight gain This list may not describe all possible side effects. Call your doctor for  medical advice about side effects. You may report side effects to FDA at 1-800-FDA-1088. Where should I keep my medicine? Keep out of the reach of children. Store at room temperature between 15 and 30 degrees C (59 and 86 degrees F) Protect from light and moisture. Throw away any unused medicine after the expiration date. NOTE: This sheet is a summary. It may not cover all possible information. If you have questions about this medicine, talk to your doctor, pharmacist, or health care provider.  2020 Elsevier/Gold Standard (2016-03-02 17:30:45)
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
## 2020-04-26 ENCOUNTER — Inpatient Hospital Stay: Payer: Medicaid Other | Attending: Hospice and Palliative Medicine | Admitting: Hospice and Palliative Medicine

## 2020-04-26 ENCOUNTER — Telehealth: Payer: Self-pay | Admitting: *Deleted

## 2020-04-26 DIAGNOSIS — Z515 Encounter for palliative care: Secondary | ICD-10-CM

## 2020-04-26 DIAGNOSIS — C7800 Secondary malignant neoplasm of unspecified lung: Secondary | ICD-10-CM | POA: Insufficient documentation

## 2020-04-26 DIAGNOSIS — R109 Unspecified abdominal pain: Secondary | ICD-10-CM | POA: Insufficient documentation

## 2020-04-26 DIAGNOSIS — C530 Malignant neoplasm of endocervix: Secondary | ICD-10-CM

## 2020-04-26 DIAGNOSIS — Z79899 Other long term (current) drug therapy: Secondary | ICD-10-CM | POA: Insufficient documentation

## 2020-04-26 DIAGNOSIS — Z9221 Personal history of antineoplastic chemotherapy: Secondary | ICD-10-CM | POA: Insufficient documentation

## 2020-04-26 DIAGNOSIS — C539 Malignant neoplasm of cervix uteri, unspecified: Secondary | ICD-10-CM | POA: Insufficient documentation

## 2020-04-26 DIAGNOSIS — G893 Neoplasm related pain (acute) (chronic): Secondary | ICD-10-CM | POA: Insufficient documentation

## 2020-04-26 DIAGNOSIS — Z923 Personal history of irradiation: Secondary | ICD-10-CM | POA: Insufficient documentation

## 2020-04-26 NOTE — Telephone Encounter (Signed)
Patient called stating she has been sick and feels dehydrated and weak, asking to come in tomorrow for IV fluids and possible Mg after checking her labs. Please advise

## 2020-04-26 NOTE — Telephone Encounter (Signed)
Spoke to patient via telephone. She accepted appointment for 10:00 am tomorrow for labs and IV fluids.

## 2020-04-26 NOTE — Progress Notes (Signed)
Virtual Visit via Telephone Note  I connected with Joanna Hall on 04/26/20 at  1:00 PM EDT by telephone and verified that I am speaking with the correct person using two identifiers.   I discussed the limitations, risks, security and privacy concerns of performing an evaluation and management service by telephone and the availability of in person appointments. I also discussed with the patient that there may be a patient responsible charge related to this service. The patient expressed understanding and agreed to proceed.   History of Present Illness: Joanna Lehr Crowderis a 54 y.o.femalewith multiple medical problems including stage IV cervical cancer metastatic to lungs s/p multiple lines of chemotherapy and RT,most recently treated with Alimta but this was discontinued due to progression and poor tolerance.She is now being followed at home by hospice.Palliative care was consulted to help address symptoms and goals..    Observations/Objective: I called and spoke with patient by phone.  She did not respond to text for virtual visit.  Patient reports that she is feeling ill today but not significantly different from baseline.  She has chronic diarrhea/abdominal pain.  She reports that hospice has been utilizing morphine elixir, which she finds to be very effective although short-lived.  Patient denied having any needs or concerns today.  I tried to reach out to patient's hospice nurse, Tammi Klippel but did not get her.  Assessment and Plan: Stage IV cervical cancer -on hospice care.  Patient would like to continue to be followed by palliative care clinic.  She requested an in person visit.  We will schedule in about a month.  Follow Up Instructions: RTC in about a month   I discussed the assessment and treatment plan with the patient. The patient was provided an opportunity to ask questions and all were answered. The patient agreed with the plan and demonstrated an understanding  of the instructions.   The patient was advised to call back or seek an in-person evaluation if the symptoms worsen or if the condition fails to improve as anticipated.  I provided 5 minutes of non-face-to-face time during this encounter.   Irean Hong, NP

## 2020-04-27 ENCOUNTER — Inpatient Hospital Stay: Payer: Medicaid Other

## 2020-04-27 ENCOUNTER — Ambulatory Visit: Payer: Medicaid Other | Admitting: Licensed Clinical Social Worker

## 2020-04-27 ENCOUNTER — Other Ambulatory Visit: Payer: Self-pay

## 2020-04-27 DIAGNOSIS — R197 Diarrhea, unspecified: Secondary | ICD-10-CM

## 2020-04-27 DIAGNOSIS — R109 Unspecified abdominal pain: Secondary | ICD-10-CM | POA: Diagnosis not present

## 2020-04-27 DIAGNOSIS — Z95828 Presence of other vascular implants and grafts: Secondary | ICD-10-CM

## 2020-04-27 DIAGNOSIS — C538 Malignant neoplasm of overlapping sites of cervix uteri: Secondary | ICD-10-CM

## 2020-04-27 DIAGNOSIS — E876 Hypokalemia: Secondary | ICD-10-CM

## 2020-04-27 DIAGNOSIS — C7800 Secondary malignant neoplasm of unspecified lung: Secondary | ICD-10-CM | POA: Diagnosis not present

## 2020-04-27 DIAGNOSIS — Z9221 Personal history of antineoplastic chemotherapy: Secondary | ICD-10-CM | POA: Diagnosis not present

## 2020-04-27 DIAGNOSIS — G893 Neoplasm related pain (acute) (chronic): Secondary | ICD-10-CM | POA: Diagnosis not present

## 2020-04-27 DIAGNOSIS — C539 Malignant neoplasm of cervix uteri, unspecified: Secondary | ICD-10-CM | POA: Diagnosis not present

## 2020-04-27 DIAGNOSIS — Z923 Personal history of irradiation: Secondary | ICD-10-CM | POA: Diagnosis not present

## 2020-04-27 DIAGNOSIS — Z79899 Other long term (current) drug therapy: Secondary | ICD-10-CM | POA: Diagnosis not present

## 2020-04-27 LAB — COMPREHENSIVE METABOLIC PANEL
ALT: 101 U/L — ABNORMAL HIGH (ref 0–44)
AST: 113 U/L — ABNORMAL HIGH (ref 15–41)
Albumin: 3.6 g/dL (ref 3.5–5.0)
Alkaline Phosphatase: 266 U/L — ABNORMAL HIGH (ref 38–126)
Anion gap: 9 (ref 5–15)
BUN: 5 mg/dL — ABNORMAL LOW (ref 6–20)
CO2: 29 mmol/L (ref 22–32)
Calcium: 8.6 mg/dL — ABNORMAL LOW (ref 8.9–10.3)
Chloride: 100 mmol/L (ref 98–111)
Creatinine, Ser: 0.67 mg/dL (ref 0.44–1.00)
GFR calc Af Amer: >60 mL/min (ref >60)
GFR calc non Af Amer: >60 mL/min (ref >60)
Glucose, Bld: 111 mg/dL — ABNORMAL HIGH (ref 70–99)
Potassium: 3.1 mmol/L — ABNORMAL LOW (ref 3.5–5.1)
Sodium: 138 mmol/L (ref 135–145)
Total Bilirubin: 1.1 mg/dL (ref 0.3–1.2)
Total Protein: 6.7 g/dL (ref 6.5–8.1)

## 2020-04-27 LAB — CBC WITH DIFFERENTIAL/PLATELET
Abs Immature Granulocytes: 0.02 10*3/uL (ref 0.00–0.07)
Basophils Absolute: 0 10*3/uL (ref 0.0–0.1)
Basophils Relative: 0 %
Eosinophils Absolute: 0 10*3/uL (ref 0.0–0.5)
Eosinophils Relative: 0 %
HCT: 37.1 % (ref 36.0–46.0)
Hemoglobin: 12.9 g/dL (ref 12.0–15.0)
Immature Granulocytes: 1 %
Lymphocytes Relative: 11 %
Lymphs Abs: 0.4 10*3/uL — ABNORMAL LOW (ref 0.7–4.0)
MCH: 32.1 pg (ref 26.0–34.0)
MCHC: 34.8 g/dL (ref 30.0–36.0)
MCV: 92.3 fL (ref 80.0–100.0)
Monocytes Absolute: 0.3 10*3/uL (ref 0.1–1.0)
Monocytes Relative: 7 %
Neutro Abs: 2.8 10*3/uL (ref 1.7–7.7)
Neutrophils Relative %: 81 %
Platelets: 143 10*3/uL — ABNORMAL LOW (ref 150–400)
RBC: 4.02 MIL/uL (ref 3.87–5.11)
RDW: 15.5 % (ref 11.5–15.5)
WBC: 3.5 10*3/uL — ABNORMAL LOW (ref 4.0–10.5)
nRBC: 0 % (ref 0.0–0.2)

## 2020-04-27 LAB — MAGNESIUM: Magnesium: 1.3 mg/dL — ABNORMAL LOW (ref 1.7–2.4)

## 2020-04-27 MED ORDER — HEPARIN SOD (PORK) LOCK FLUSH 100 UNIT/ML IV SOLN
500.0000 [IU] | Freq: Once | INTRAVENOUS | Status: AC
  Administered 2020-04-27: 500 [IU] via INTRAVENOUS
  Filled 2020-04-27: qty 5

## 2020-04-27 MED ORDER — SODIUM CHLORIDE 0.9% FLUSH
10.0000 mL | INTRAVENOUS | Status: DC | PRN
  Administered 2020-04-27: 10 mL via INTRAVENOUS
  Filled 2020-04-27: qty 10

## 2020-04-27 MED ORDER — SODIUM CHLORIDE 0.9 % IV SOLN
Freq: Once | INTRAVENOUS | Status: AC
  Filled 2020-04-27: qty 10

## 2020-04-28 ENCOUNTER — Other Ambulatory Visit: Payer: Self-pay

## 2020-04-28 ENCOUNTER — Ambulatory Visit: Payer: Medicaid Other | Admitting: Licensed Clinical Social Worker

## 2020-05-16 DEATH — deceased

## 2020-05-24 ENCOUNTER — Encounter: Payer: Medicaid Other | Admitting: Hospice and Palliative Medicine

## 2020-06-04 ENCOUNTER — Telehealth: Payer: Medicaid Other | Admitting: Psychiatry

## 2020-07-06 ENCOUNTER — Ambulatory Visit: Payer: Medicaid Other | Admitting: Gastroenterology

## 2022-03-28 IMAGING — CT CT ABD-PELV W/ CM
2 of 5 series · 15 of 46 positions shown, 17 images · IV contrast (APPLIED)
Comparison: 12/28/2019

CLINICAL DATA: Right-sided abdominal and pelvic pain. Right lower
quadrant and low back pain.

EXAM:
CT ABDOMEN AND PELVIS WITH CONTRAST
TECHNIQUE: Multidetector CT imaging of the abdomen and pelvis was performed
using the standard protocol following bolus administration of
intravenous contrast.
CONTRAST:  100mL OMNIPAQUE IOHEXOL 300 MG/ML  SOLN

[Series 2: routine abd/pel with · axial · 0.96mm/px · z∈[-877,-437]mm · 12 of 100 slices shown, 14 images]
[im 6/100  soft-tissue]
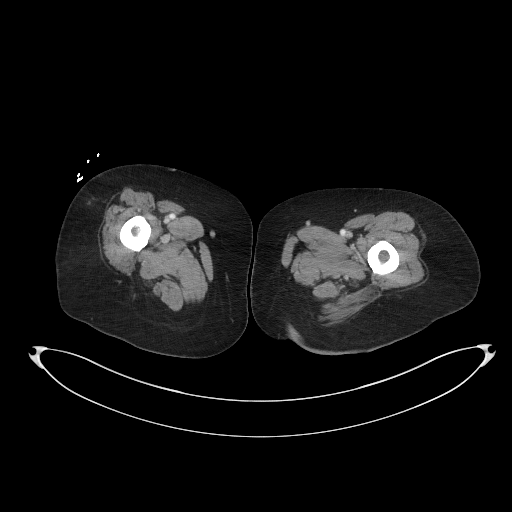
[im 6/100  bone]
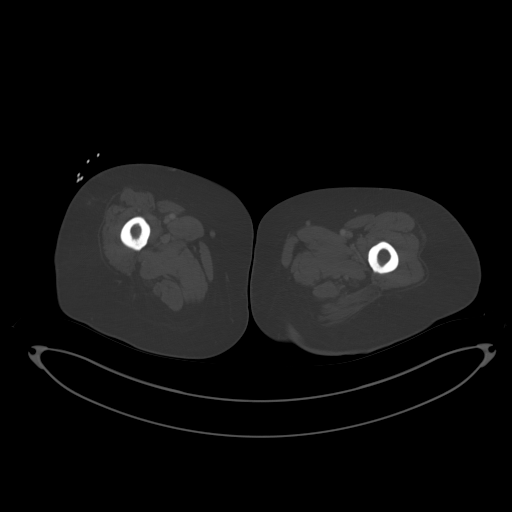
[im 17/100  soft-tissue]
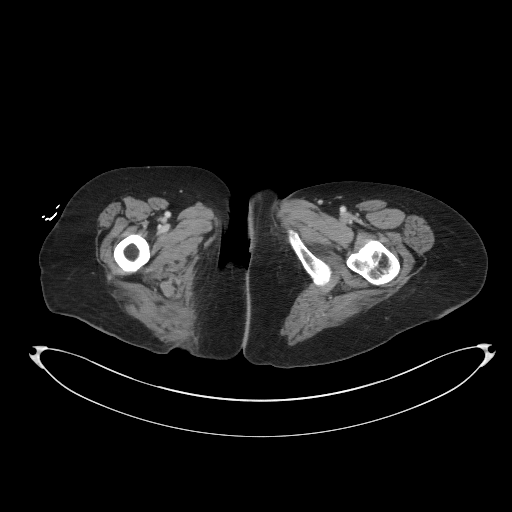
[im 23/100  soft-tissue]
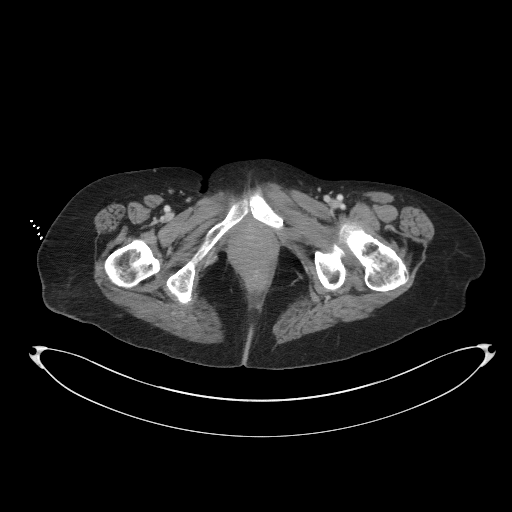
[im 28/100  soft-tissue]
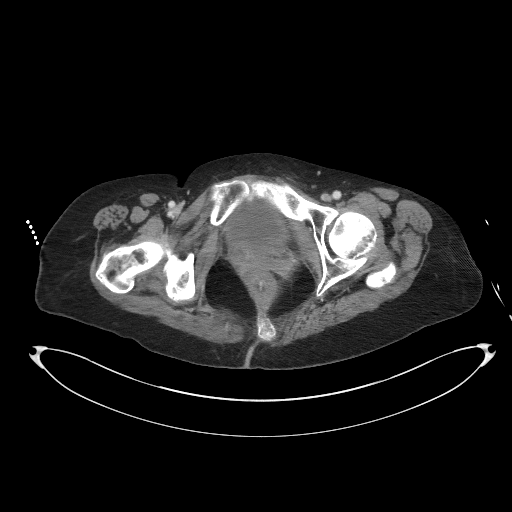
[im 39/100  soft-tissue]
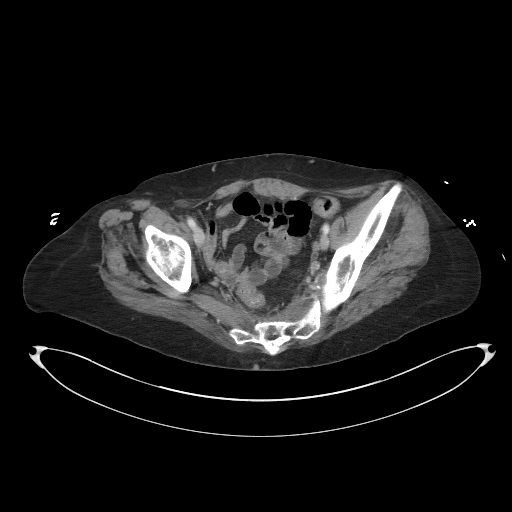
[im 45/100  soft-tissue]
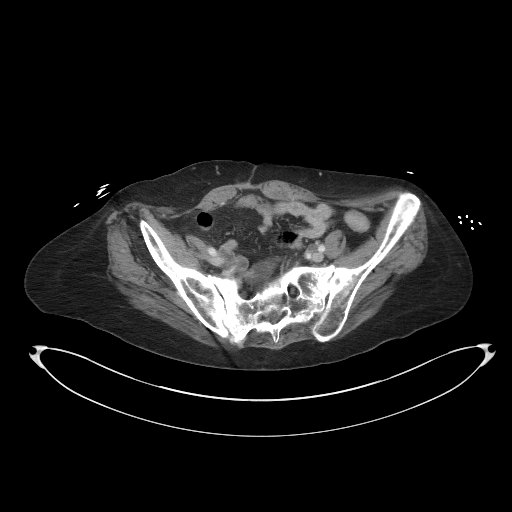
[im 56/100  soft-tissue]
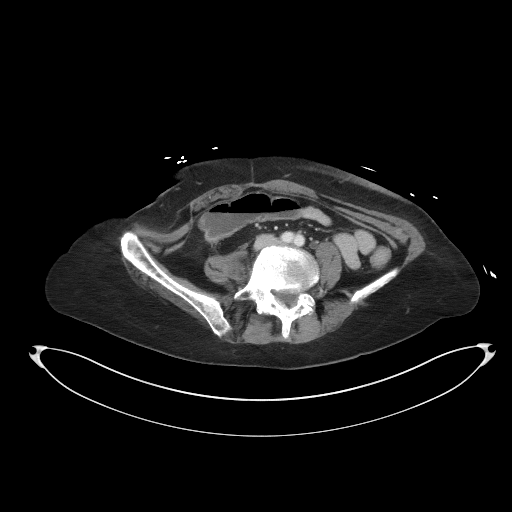
[im 61/100  soft-tissue]
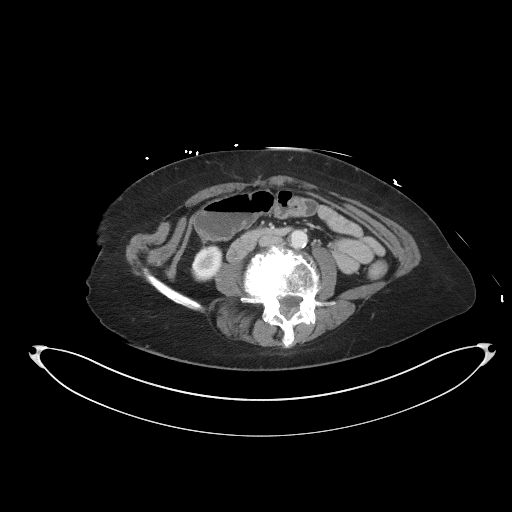
[im 72/100  soft-tissue]
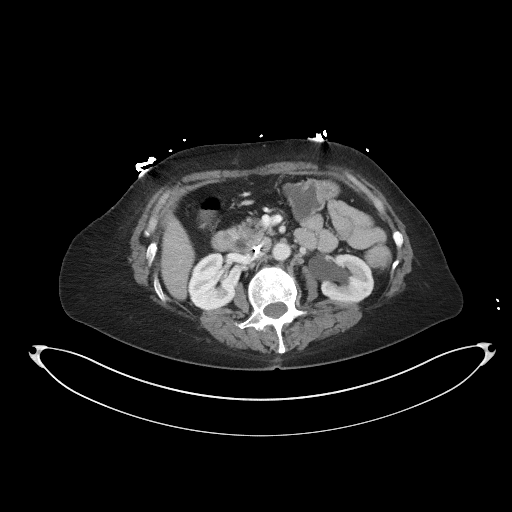
[im 72/100  bone]
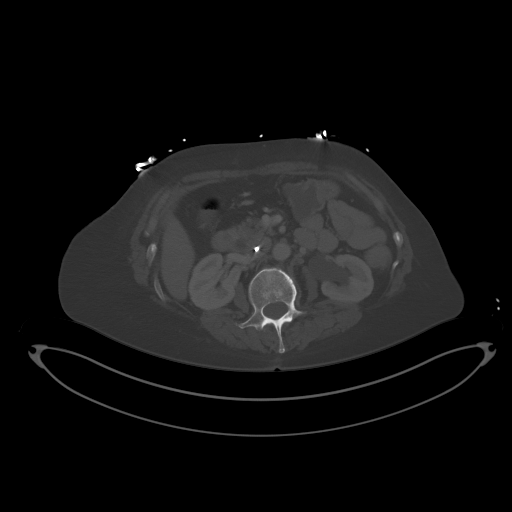
[im 78/100  soft-tissue]
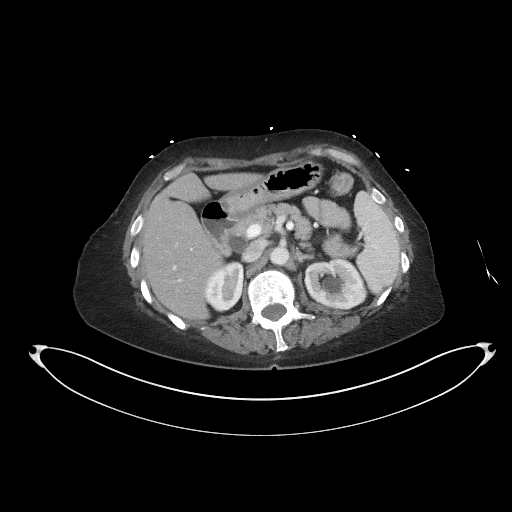
[im 83/100  soft-tissue]
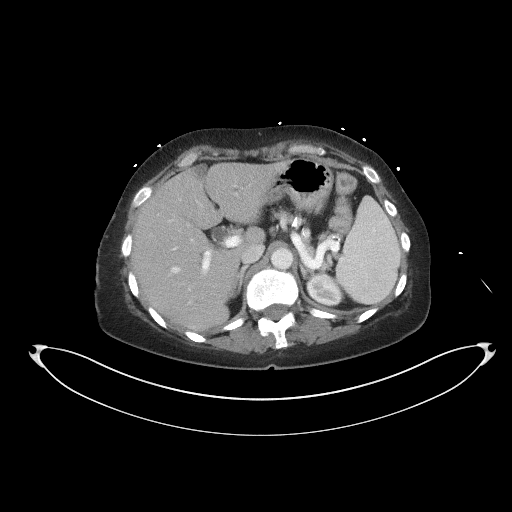
[im 94/100  soft-tissue]
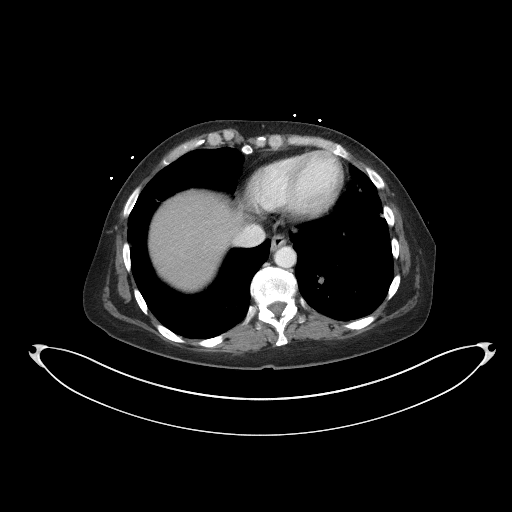

[Series 5: coronal st · coronal · 0.87mm/px · 3 of 92 slices shown]
[im 31/92  soft-tissue]
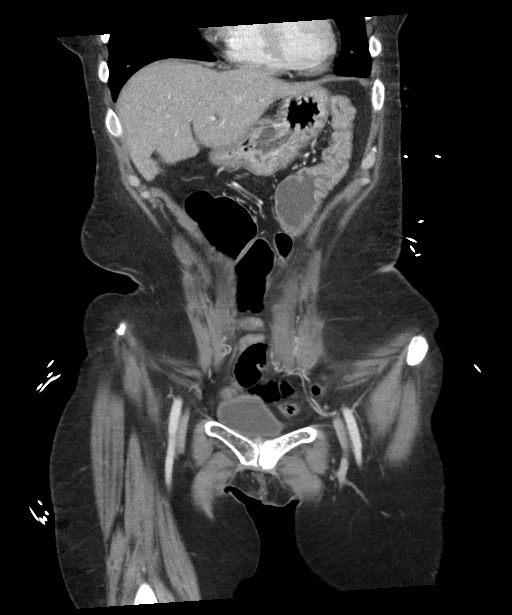
[im 41/92  soft-tissue]
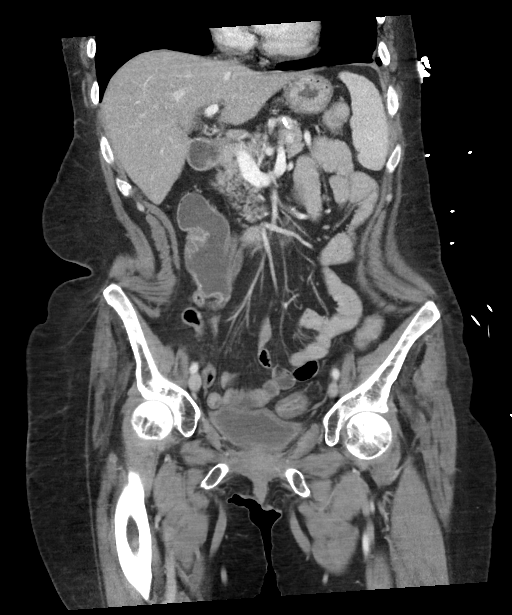
[im 51/92  soft-tissue]
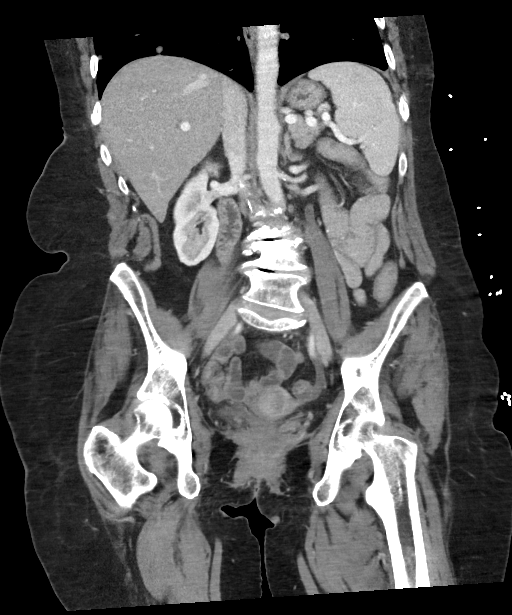

[15 of 46 positions shown; findings below may reference images not displayed]

FINDINGS: Lower chest: Numerous nodules again identified in the lung bases,
some of which are cavitary. No substantial change compared to the
study from 2 weeks ago.

Hepatobiliary: No suspicious focal abnormality within the liver
parenchyma. Mild intrahepatic biliary duct prominence is stable.
Extrahepatic biliary dilatation is stable. Common bile duct measures
13 mm in the head of pancreas.

Pancreas: No focal mass lesion. No dilatation of the main duct. No
intraparenchymal cyst. No peripancreatic edema.

Spleen: No splenomegaly. No focal mass lesion.

Adrenals/Urinary Tract: No adrenal nodule or mass. Right kidney
unremarkable. Moderate left hydroureteronephrosis is stable since
prior with ureteral dilatation extending down into the lower pelvis.
Distal ureter not well demonstrated on axial imaging, but coronal
imaging suggests enhancing soft tissue in the distal left ureter
(coronal image 51/series 5) just proximal to the UVJ. Mild
left-sided bladder wall thickening is best appreciated on coronal
imaging.

Stomach/Bowel: Stomach is unremarkable. No gastric wall thickening.
No evidence of outlet obstruction. Duodenum is normally positioned
as is the ligament of Treitz. No small bowel wall thickening. No
small bowel dilatation. Status post right hemicolectomy transverse
colon is similar to prior, mildly distended and fluid-filled with
abrupt transition to decompressed colon with wall thickening in the
region of the distal transverse segment/splenic flexure. Left colon
remains decompressed down to the rectum.

Vascular/Lymphatic: There is abdominal aortic atherosclerosis
without aneurysm. IVC filter visualized in situ. There is no
gastrohepatic or hepatoduodenal ligament lymphadenopathy. No
retroperitoneal or mesenteric lymphadenopathy. No pelvic sidewall
lymphadenopathy.

Reproductive: Similar appearance of uterus and cervix, remaining
ill-defined.

Other: No intraperitoneal free fluid. Stranding in the lower pelvis
likely treatment related. This is stable.

Musculoskeletal: Probable sacral insufficiency fractures with stable
sclerosis in the anterior left pubic bone.
IMPRESSION: 1. No substantial change in exam.
2. Persistent moderate left hydroureteronephrosis with apparent
enhancing soft tissue at the distal left ureter just proximal to the
UVJ. Urothelial neoplasm or involvement by known cervical cancer not
excluded.
3. Similar appearance of numerous pulmonary nodules in the lung
bases, some of which are cavitary. Imaging features remain
compatible with metastatic disease.
4. Left colon remains decompressed which may account for the
apparent circumferential wall thickening. Infectious/inflammatory
colitis cannot be excluded.
5. Similar appearance of ill-defined uterus and cervix.
6. Aortic Atherosclerosis ([id number]).
